# Patient Record
Sex: Female | Born: 1940 | Race: White | Hispanic: No | Smoking: Former smoker
Health system: Southern US, Community
[De-identification: ages and names within clinical notes are randomized; demographics above are authoritative.]

## PROBLEM LIST (undated history)

## (undated) DIAGNOSIS — R17 Unspecified jaundice: Secondary | ICD-10-CM

## (undated) DIAGNOSIS — I499 Cardiac arrhythmia, unspecified: Secondary | ICD-10-CM

## (undated) DIAGNOSIS — D649 Anemia, unspecified: Secondary | ICD-10-CM

## (undated) DIAGNOSIS — K219 Gastro-esophageal reflux disease without esophagitis: Secondary | ICD-10-CM

## (undated) DIAGNOSIS — C801 Malignant (primary) neoplasm, unspecified: Secondary | ICD-10-CM

## (undated) DIAGNOSIS — I10 Essential (primary) hypertension: Secondary | ICD-10-CM

## (undated) DIAGNOSIS — I2510 Atherosclerotic heart disease of native coronary artery without angina pectoris: Secondary | ICD-10-CM

## (undated) DIAGNOSIS — E785 Hyperlipidemia, unspecified: Secondary | ICD-10-CM

## (undated) DIAGNOSIS — M1990 Unspecified osteoarthritis, unspecified site: Secondary | ICD-10-CM

## (undated) DIAGNOSIS — I219 Acute myocardial infarction, unspecified: Secondary | ICD-10-CM

## (undated) DIAGNOSIS — J189 Pneumonia, unspecified organism: Secondary | ICD-10-CM

## (undated) DIAGNOSIS — G709 Myoneural disorder, unspecified: Secondary | ICD-10-CM

## (undated) DIAGNOSIS — R011 Cardiac murmur, unspecified: Secondary | ICD-10-CM

## (undated) DIAGNOSIS — E119 Type 2 diabetes mellitus without complications: Secondary | ICD-10-CM

## (undated) DIAGNOSIS — Z8719 Personal history of other diseases of the digestive system: Secondary | ICD-10-CM

## (undated) DIAGNOSIS — T8859XA Other complications of anesthesia, initial encounter: Secondary | ICD-10-CM

## (undated) DIAGNOSIS — L03116 Cellulitis of left lower limb: Secondary | ICD-10-CM

## (undated) DIAGNOSIS — I739 Peripheral vascular disease, unspecified: Secondary | ICD-10-CM

## (undated) DIAGNOSIS — T4145XA Adverse effect of unspecified anesthetic, initial encounter: Secondary | ICD-10-CM

## (undated) HISTORY — DX: Type 2 diabetes mellitus without complications: E11.9

## (undated) HISTORY — PX: ABDOMINAL HYSTERECTOMY: SHX81

## (undated) HISTORY — PX: TUBAL LIGATION: SHX77

## (undated) HISTORY — PX: TONSILLECTOMY: SUR1361

## (undated) HISTORY — PX: COLONOSCOPY: SHX174

## (undated) HISTORY — PX: CARPAL TUNNEL RELEASE: SHX101

## (undated) HISTORY — PX: APPENDECTOMY: SHX54

## (undated) HISTORY — DX: Hyperlipidemia, unspecified: E78.5

## (undated) HISTORY — PX: CARDIAC CATHETERIZATION: SHX172

## (undated) HISTORY — DX: Essential (primary) hypertension: I10

## (undated) HISTORY — PX: EYE SURGERY: SHX253

## (undated) MED FILL — Magnesium Sulfate IV Soln 4 GM/100ML (40 MG/ML): INTRAVENOUS | Qty: 100 | Status: AC

---
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
## 1998-09-08 ENCOUNTER — Encounter: Admission: RE | Admit: 1998-09-08 | Discharge: 1998-12-07 | Payer: Self-pay | Admitting: Family Medicine

## 1998-10-14 ENCOUNTER — Encounter: Payer: Self-pay | Admitting: Cardiology

## 1998-10-14 ENCOUNTER — Ambulatory Visit (HOSPITAL_COMMUNITY): Admission: RE | Admit: 1998-10-14 | Discharge: 1998-10-14 | Payer: Self-pay | Admitting: Cardiology

## 1999-08-21 ENCOUNTER — Ambulatory Visit (HOSPITAL_COMMUNITY): Admission: RE | Admit: 1999-08-21 | Discharge: 1999-08-21 | Payer: Self-pay | Admitting: Gastroenterology

## 2001-02-19 ENCOUNTER — Encounter: Payer: Self-pay | Admitting: Family Medicine

## 2001-02-19 ENCOUNTER — Ambulatory Visit (HOSPITAL_COMMUNITY): Admission: RE | Admit: 2001-02-19 | Discharge: 2001-02-19 | Payer: Self-pay | Admitting: Family Medicine

## 2001-05-07 ENCOUNTER — Encounter: Payer: Self-pay | Admitting: Obstetrics and Gynecology

## 2001-05-07 ENCOUNTER — Encounter: Admission: RE | Admit: 2001-05-07 | Discharge: 2001-05-07 | Payer: Self-pay | Admitting: Obstetrics and Gynecology

## 2001-08-13 ENCOUNTER — Ambulatory Visit (HOSPITAL_COMMUNITY): Admission: RE | Admit: 2001-08-13 | Discharge: 2001-08-13 | Payer: Self-pay | Admitting: Obstetrics and Gynecology

## 2001-08-13 ENCOUNTER — Encounter (INDEPENDENT_AMBULATORY_CARE_PROVIDER_SITE_OTHER): Payer: Self-pay

## 2002-09-29 ENCOUNTER — Encounter: Payer: Self-pay | Admitting: Family Medicine

## 2002-09-29 ENCOUNTER — Encounter: Admission: RE | Admit: 2002-09-29 | Discharge: 2002-09-29 | Payer: Self-pay | Admitting: Family Medicine

## 2004-01-10 ENCOUNTER — Other Ambulatory Visit: Admission: RE | Admit: 2004-01-10 | Discharge: 2004-01-10 | Payer: Self-pay | Admitting: Family Medicine

## 2006-02-28 ENCOUNTER — Other Ambulatory Visit: Admission: RE | Admit: 2006-02-28 | Discharge: 2006-02-28 | Payer: Self-pay | Admitting: Family Medicine

## 2007-05-13 ENCOUNTER — Other Ambulatory Visit: Admission: RE | Admit: 2007-05-13 | Discharge: 2007-05-13 | Payer: Self-pay | Admitting: Family Medicine

## 2007-08-14 ENCOUNTER — Encounter: Admission: RE | Admit: 2007-08-14 | Discharge: 2007-08-14 | Payer: Self-pay | Admitting: Otolaryngology

## 2010-02-16 ENCOUNTER — Other Ambulatory Visit: Admission: RE | Admit: 2010-02-16 | Discharge: 2010-02-16 | Payer: Self-pay | Admitting: Family Medicine

## 2010-10-15 ENCOUNTER — Encounter: Payer: Self-pay | Admitting: Otolaryngology

## 2011-02-09 NOTE — H&P (Signed)
[facility]  Patient:    [name], [place] Visit Number: [id number] MRN: [id number]          Service Type: Attending:  Olivia Canter. Theda Sers, M.D., Ph.D. Dictated by:   Olivia Canter. Theda Sers, M.D., Ph.D. Adm. Date:  08/13/01   CC:         Frann Rider, M.D., [place] at [place], [facility]   History and Physical  DATE OF BIRTH:                13-Dec-1940  HISTORY OF PRESENT ILLNESS:   The patient is a 70 year old female, who approximately one and a half years ago, began having irregular periods.  Her menstrual periods were regular until that time.  She had been amenorrheic for approximately 10 years from the time she was 64 when she had a tubal ligation. She then restarted her menstrual periods which were regular.  I have explained to her that there is no physiologic to become amenorrheic after a tubal ligation.  She subsequently began having irregular menses and was referred here for further evaluation and management.  The patient subsequently underwent a sonohysterogram which showed a sessile polypoid soft tissue mass, suspicious for an endometrial polyp or endometrial carcinoma, projecting into the lumen of the uterine cavity.  The patient was counseled to undergo a D&C hysteroscopy to rule out malignancy and to remove the polyp.  The risks of surgery including anesthetic complication, hemorrhage, infection, damage to adjacent structures including bladder, bowel, and ureters was discussed with the patient.  She was made aware of the risk of uterine perforation which could result in overwhelming life-threatening hemorrhage requiring emergent hysterectomy or uterine perforation which could result in bowel damage requiring emergent colostomy or which result in overwhelming life-threatening peritonitis.  She expressed understand of and acceptance of these risks.  PAST OBSTETRIC GYNECOLOGIC HISTORY:          1.  Menarche at age 32.                               2. No history of DES exposure.                               3. No history of IUD or STD.  PAST MEDICAL HISTORY:         1. Diabetes.                               2. Hypertension.                               3. Irregular heartbeat for which she sees                                  Dr. Melvern Banker.  She has had an echocardiogram                                  and a Cardiolite stress test.  ALLERGIES:                    DEMEROL.  CURRENT MEDICATIONS:          Toprol, Zestoretic, Glynase, Glucophage, Caltrate, and vitamin E.  SURGERIES:                    Cesarean section x 2, one in 1969 and another in 1972.  FAMILY HISTORY:               There is no family history of ovarian or prostate cancer.  The patients father was diagnosed with colon cancer at the age of 62 and died from this at age 39.  The patients mother is 65 years old with rheumatoid arthritis and hypertension.  The patient has an aunt with breast cancer and one brother age 37 with melanoma.  She has three children, ages 74, 55, and 23 alive and well.  SOCIAL HISTORY:               Native of [place].  Occupation Optometrist. Marital status, married.  Tobacco none, alcohol none.  No recreational drugs.  REVIEW OF SYSTEMS:            Denies headaches, visual changes, chest pain, shortness of breath, abdominal pain, change in bowel habits, unintentional weight loss, dysuria, urgency, frequency, vaginal pruritus or pain or bleeding with intercourse.  PHYSICAL EXAMINATION:  GENERAL:                      Well-developed, pleasant, Caucasian female.  VITAL SIGNS:                  Blood pressure 122/60, weight 176, height 5 feet 4.75 inches.  HEENT:                        Normal.  NECK:                         Supple without thyromegaly.  LUNGS:                        Clear to auscultation.  CARDIAC:                      Regular rate and rhythm.  ABDOMEN:                       Soft, nontender, no hepatosplenomegaly or masses.  There is a well-healed vertical incision scar.  EXTREMITIES:                  No clubbing, cyanosis, or edema.  NEUROLOGIC:                   Oriented x 3.  Grossly normal.  PELVIC:                       Normal external female genitalia.  No vulvar, vaginal, or cervical lesions.  Pap smear was performed by Dr. Inda Merlin and was within normal limits.  Bimanual examination reveals the uterus to be mobile, at the upper limits of normal, approximately eight weeks without any adnexal mass palpated.  ASSESSMENT AND PLAN:          The patient is a 70 year old Caucasian female with postmenopausal bleeding and an endometrial mass, suspicious for endometrial cancer or sessile polyp, admitted for dilation and curettage hysteroscopy.  Risks have been assigned to the patient. She expresses  understanding of and acceptance of these risks.  Please send this straight over the Day Surgery area at [facility] for surgery this afternoon. Dictated by:   Olivia Canter Theda Sers, M.D., Ph.D. Attending:  Olivia Canter. Theda Sers, M.D., Ph.D. DD:  08/13/01 TD:  08/13/01 Job: [id number]

## 2011-02-09 NOTE — Op Note (Signed)
[facility]  Patient:    [name], [name] Visit Number: [id number] MRN: [id number]          Service Type: DSU Location: [facility] Attending Physician:  Lovey Newcomer Dictated by:   Olivia Canter Theda Sers, M.D. Proc. Date: 08/13/01 Admit Date:  08/13/2001   CC:         Frann Rider, M.D.   Operative Report  PREOPERATIVE DIAGNOSIS:       Postmenopausal bleeding, probable sessile polyp.  POSTOPERATIVE DIAGNOSIS:      Postmenopausal bleeding, probable sessile polyp.  OPERATION:                    Cervical dilatation, endometrial curettage using Pipelle, hysteroscopy.  SURGEON:                      Olivia Canter. Theda Sers, M.D.  ANESTHESIA:                   MAC plus 10 cc of 1% lidocaine for paracervical block.  COMPLICATIONS:                None.  DRAINS:                       None.  FLUIDS:                       Approximately 1500 cc of crystalloid.  DESCRIPTION OF PROCEDURE:     The patient was brought to the operating room, identified on the operating table.  After the patient was adequately sedated using monitored anesthesia care, she was prepped and draped in the usual sterile fashion.  She was placed in [place] prior to this.  The bladder was straight catheterized for approximately 50 cc of clear yellow urine. Examination under anesthesia revealed the uterus to be approximately eight weeks, anteverted and not very mobile due to the patients broad vertical cesarean section incision.  The speculum was placed and the cervix was noted to be not very mobile at all, but I was able to infiltrate the anterior portion with 1% lidocaine and grasped it with a single-tooth tenaculum.  The uterus would not pull down using the tenaculum.  I then infiltrated the remainder of 9 cc of 1% lidocaine for paracervical block.  The cervix was then gently dilated.  I was unable to dilate the cervix past the #15 dilator.  In fact, was not able to even initially  get the sound up to the fundus of the uterus.  I was able to hysteroscope the cervical canal but was unable to move to dilate to accommodate the endometrial cavity.  I believe this was due to the immobility of the cervix due to scar tissue due to adhesive disease from the patients previous cesarean section.  I subsequently placed the Pipelle up to the uterine fundus and was able to dilate along the course of the Pipelle as mentioned to a #15 Pratt dilator.  However, I was unable to safely dilate past the #15 Pratt dilator without undue force.  It was my clinical judgment that the risk of perforation was not worth the benefits of obtaining the tissue since I have obtained adequate tissue with two passes of the Pipelle. There was noted to be no bleeding.  At that point the procedure was then terminated.  The patient tolerated the procedure well without apparent complications and was transferred to  the recovery room in stable condition. All instrument, sponge, needle counts were correct.  The patient was given a D&C instruction sheet, urged to return to the office in two weeks for postoperative evaluation and to call for any problems. Dictated by:   Olivia Canter Theda Sers, M.D. Attending Physician:  Lovey Newcomer DD:  08/13/01 TD:  08/13/01 Job: [id number]

## 2011-11-07 DIAGNOSIS — E782 Mixed hyperlipidemia: Secondary | ICD-10-CM | POA: Diagnosis not present

## 2011-11-07 DIAGNOSIS — I10 Essential (primary) hypertension: Secondary | ICD-10-CM | POA: Diagnosis not present

## 2011-11-07 DIAGNOSIS — E119 Type 2 diabetes mellitus without complications: Secondary | ICD-10-CM | POA: Diagnosis not present

## 2011-11-07 DIAGNOSIS — R011 Cardiac murmur, unspecified: Secondary | ICD-10-CM | POA: Diagnosis not present

## 2011-11-14 DIAGNOSIS — Z961 Presence of intraocular lens: Secondary | ICD-10-CM | POA: Diagnosis not present

## 2011-11-19 DIAGNOSIS — I10 Essential (primary) hypertension: Secondary | ICD-10-CM | POA: Diagnosis not present

## 2011-11-19 DIAGNOSIS — E7800 Pure hypercholesterolemia, unspecified: Secondary | ICD-10-CM | POA: Diagnosis not present

## 2011-11-20 DIAGNOSIS — I059 Rheumatic mitral valve disease, unspecified: Secondary | ICD-10-CM | POA: Diagnosis not present

## 2011-11-20 DIAGNOSIS — R011 Cardiac murmur, unspecified: Secondary | ICD-10-CM | POA: Diagnosis not present

## 2011-11-20 DIAGNOSIS — I10 Essential (primary) hypertension: Secondary | ICD-10-CM

## 2011-11-20 HISTORY — DX: Essential (primary) hypertension: I10

## 2011-12-04 DIAGNOSIS — I10 Essential (primary) hypertension: Secondary | ICD-10-CM | POA: Diagnosis not present

## 2012-01-09 ENCOUNTER — Other Ambulatory Visit: Payer: Self-pay | Admitting: Dermatology

## 2012-01-09 DIAGNOSIS — D239 Other benign neoplasm of skin, unspecified: Secondary | ICD-10-CM | POA: Diagnosis not present

## 2012-01-09 DIAGNOSIS — D485 Neoplasm of uncertain behavior of skin: Secondary | ICD-10-CM | POA: Diagnosis not present

## 2012-01-09 DIAGNOSIS — D1801 Hemangioma of skin and subcutaneous tissue: Secondary | ICD-10-CM | POA: Diagnosis not present

## 2012-01-09 DIAGNOSIS — L538 Other specified erythematous conditions: Secondary | ICD-10-CM | POA: Diagnosis not present

## 2012-02-13 ENCOUNTER — Ambulatory Visit
Admission: RE | Admit: 2012-02-13 | Discharge: 2012-02-13 | Disposition: A | Discharge status: Home / Self Care | Payer: Medicare Other | Source: Ambulatory Visit | Attending: Internal Medicine | Admitting: Internal Medicine

## 2012-02-13 ENCOUNTER — Other Ambulatory Visit: Payer: Self-pay | Admitting: Internal Medicine

## 2012-02-13 DIAGNOSIS — R9389 Abnormal findings on diagnostic imaging of other specified body structures: Secondary | ICD-10-CM | POA: Diagnosis not present

## 2012-02-13 MED ORDER — IOHEXOL 300 MG/ML  SOLN
75.0000 mL | Freq: Once | INTRAMUSCULAR | Status: AC | PRN
  Administered 2012-02-13: 75 mL via INTRAVENOUS

## 2012-04-23 DIAGNOSIS — M79609 Pain in unspecified limb: Secondary | ICD-10-CM | POA: Diagnosis not present

## 2012-04-24 ENCOUNTER — Other Ambulatory Visit: Payer: Self-pay | Admitting: Family Medicine

## 2012-04-24 ENCOUNTER — Ambulatory Visit
Admission: RE | Admit: 2012-04-24 | Discharge: 2012-04-24 | Disposition: A | Discharge status: Home / Self Care | Payer: Medicare Other | Source: Ambulatory Visit | Attending: Family Medicine | Admitting: Family Medicine

## 2012-04-24 DIAGNOSIS — M79672 Pain in left foot: Secondary | ICD-10-CM

## 2012-04-24 DIAGNOSIS — I70229 Atherosclerosis of native arteries of extremities with rest pain, unspecified extremity: Secondary | ICD-10-CM | POA: Diagnosis not present

## 2012-04-24 DIAGNOSIS — M7730 Calcaneal spur, unspecified foot: Secondary | ICD-10-CM | POA: Diagnosis not present

## 2012-04-24 DIAGNOSIS — R209 Unspecified disturbances of skin sensation: Secondary | ICD-10-CM | POA: Diagnosis not present

## 2012-04-24 DIAGNOSIS — S92309A Fracture of unspecified metatarsal bone(s), unspecified foot, initial encounter for closed fracture: Secondary | ICD-10-CM | POA: Diagnosis not present

## 2012-05-16 DIAGNOSIS — S92309A Fracture of unspecified metatarsal bone(s), unspecified foot, initial encounter for closed fracture: Secondary | ICD-10-CM | POA: Diagnosis not present

## 2012-06-12 DIAGNOSIS — Z23 Encounter for immunization: Secondary | ICD-10-CM | POA: Diagnosis not present

## 2012-06-12 DIAGNOSIS — Z0000 Encounter for general adult medical examination without abnormal findings: Secondary | ICD-10-CM | POA: Diagnosis not present

## 2012-06-12 DIAGNOSIS — E7800 Pure hypercholesterolemia, unspecified: Secondary | ICD-10-CM | POA: Diagnosis not present

## 2012-06-12 DIAGNOSIS — I10 Essential (primary) hypertension: Secondary | ICD-10-CM | POA: Diagnosis not present

## 2012-06-12 DIAGNOSIS — Z1331 Encounter for screening for depression: Secondary | ICD-10-CM | POA: Diagnosis not present

## 2012-06-12 DIAGNOSIS — K219 Gastro-esophageal reflux disease without esophagitis: Secondary | ICD-10-CM | POA: Diagnosis not present

## 2012-06-12 DIAGNOSIS — E119 Type 2 diabetes mellitus without complications: Secondary | ICD-10-CM | POA: Diagnosis not present

## 2012-06-18 DIAGNOSIS — S92309A Fracture of unspecified metatarsal bone(s), unspecified foot, initial encounter for closed fracture: Secondary | ICD-10-CM | POA: Diagnosis not present

## 2012-07-18 DIAGNOSIS — S92309A Fracture of unspecified metatarsal bone(s), unspecified foot, initial encounter for closed fracture: Secondary | ICD-10-CM | POA: Diagnosis not present

## 2012-08-25 DIAGNOSIS — Z1231 Encounter for screening mammogram for malignant neoplasm of breast: Secondary | ICD-10-CM | POA: Diagnosis not present

## 2012-08-25 DIAGNOSIS — Z803 Family history of malignant neoplasm of breast: Secondary | ICD-10-CM | POA: Diagnosis not present

## 2012-12-12 DIAGNOSIS — H02829 Cysts of unspecified eye, unspecified eyelid: Secondary | ICD-10-CM | POA: Diagnosis not present

## 2012-12-12 DIAGNOSIS — E119 Type 2 diabetes mellitus without complications: Secondary | ICD-10-CM | POA: Diagnosis not present

## 2012-12-12 DIAGNOSIS — Z961 Presence of intraocular lens: Secondary | ICD-10-CM | POA: Diagnosis not present

## 2012-12-22 DIAGNOSIS — I10 Essential (primary) hypertension: Secondary | ICD-10-CM | POA: Diagnosis not present

## 2012-12-22 DIAGNOSIS — E119 Type 2 diabetes mellitus without complications: Secondary | ICD-10-CM | POA: Diagnosis not present

## 2012-12-22 DIAGNOSIS — E7800 Pure hypercholesterolemia, unspecified: Secondary | ICD-10-CM | POA: Diagnosis not present

## 2013-01-07 DIAGNOSIS — D1801 Hemangioma of skin and subcutaneous tissue: Secondary | ICD-10-CM | POA: Diagnosis not present

## 2013-01-07 DIAGNOSIS — D239 Other benign neoplasm of skin, unspecified: Secondary | ICD-10-CM | POA: Diagnosis not present

## 2013-02-23 ENCOUNTER — Other Ambulatory Visit: Payer: Self-pay | Admitting: Internal Medicine

## 2013-02-25 NOTE — Telephone Encounter (Signed)
Please have patient call for an appointment

## 2013-03-24 ENCOUNTER — Other Ambulatory Visit: Payer: Self-pay | Admitting: Internal Medicine

## 2013-03-24 NOTE — Telephone Encounter (Signed)
Rx was sent to pharmacy electronically. 

## 2013-03-30 ENCOUNTER — Encounter: Payer: Self-pay | Admitting: *Deleted

## 2013-03-30 ENCOUNTER — Encounter: Payer: Self-pay | Admitting: Internal Medicine

## 2013-03-31 ENCOUNTER — Ambulatory Visit (INDEPENDENT_AMBULATORY_CARE_PROVIDER_SITE_OTHER): Payer: Medicare Other | Admitting: Internal Medicine

## 2013-03-31 ENCOUNTER — Encounter: Payer: Self-pay | Admitting: Internal Medicine

## 2013-03-31 VITALS — BP 134/64 | HR 70 | Ht 65.5 in | Wt 171.6 lb

## 2013-03-31 DIAGNOSIS — I10 Essential (primary) hypertension: Secondary | ICD-10-CM | POA: Insufficient documentation

## 2013-03-31 DIAGNOSIS — I779 Disorder of arteries and arterioles, unspecified: Secondary | ICD-10-CM | POA: Insufficient documentation

## 2013-03-31 DIAGNOSIS — N1831 Chronic kidney disease, stage 3a: Secondary | ICD-10-CM | POA: Insufficient documentation

## 2013-03-31 DIAGNOSIS — E663 Overweight: Secondary | ICD-10-CM | POA: Insufficient documentation

## 2013-03-31 DIAGNOSIS — I739 Peripheral vascular disease, unspecified: Secondary | ICD-10-CM | POA: Insufficient documentation

## 2013-03-31 DIAGNOSIS — I658 Occlusion and stenosis of other precerebral arteries: Secondary | ICD-10-CM

## 2013-03-31 DIAGNOSIS — I6523 Occlusion and stenosis of bilateral carotid arteries: Secondary | ICD-10-CM

## 2013-03-31 DIAGNOSIS — E785 Hyperlipidemia, unspecified: Secondary | ICD-10-CM | POA: Diagnosis not present

## 2013-03-31 DIAGNOSIS — Z6825 Body mass index (BMI) 25.0-25.9, adult: Secondary | ICD-10-CM

## 2013-03-31 DIAGNOSIS — I6529 Occlusion and stenosis of unspecified carotid artery: Secondary | ICD-10-CM

## 2013-03-31 DIAGNOSIS — E119 Type 2 diabetes mellitus without complications: Secondary | ICD-10-CM | POA: Insufficient documentation

## 2013-03-31 HISTORY — DX: Disorder of arteries and arterioles, unspecified: I77.9

## 2013-03-31 NOTE — Patient Instructions (Addendum)
Your physician has requested that you have a carotid duplex. This test is an ultrasound of the carotid arteries in your neck. It looks at blood flow through these arteries that supply the brain with blood. Allow one hour for this exam. There are no restrictions or special instructions.  Your physician recommends that you have lab work done (at [facility]).  NMR with lipid  Your physician has requested that you have a lower extremity arterial duplex. This test is an ultrasound of the arteries in the legs.. It looks at arterial blood flow in the legs. Allow one hour for Lower Arterial scans. There are no restrictions or special instructions  Your physician recommends that you schedule a follow-up appointment in: 1 year

## 2013-03-31 NOTE — Progress Notes (Signed)
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 Recently, she was having some increasing shortness of breath with exertion and I recommended an echocardiogram. That echocardiogram performed on November 20, 2011, demonstrated a borderline concentric LVH, EF greater than 55% and mild LV relaxation abnormality. Left atrium was normal size, but there was noted to be calcified mass near the left atrial appendage. There was mitral annular calcification and there was no evidence of atrial septal defect. There was no aortic stenosis, but some mild calcification of the aortic valve leaflets. I obtained a CT scan which did not show any significant mass in the atrium. She reports doing fairly well however recently had screening tests including carotid artery Dopplers which indicated mild left internal carotid artery stenosis and moderate right internal carotid artery stenosis. She also had LE dopplers which indicated a mildly reduced ABI of 0.77 on the left.  She denies any chest pain, dypsnea on exertion or other related symptoms.  PMHx:  Past Medical History  Diagnosis Date  . Diabetes mellitus without complication   . Hypertension 11/20/11    ECHO- EF>55% Borderline concentric left ventricular hypertrophy. There is a small calcified mass in the L:A near the LA appendage. No valvular masses seen with associated mitral annular calcification. LA Volume/ BSA27.4 ml/m2 No AS. Right ventricular systolic pressure is elevated at 42mmHg.  Marland Kitchen Hyperlipidemia     History reviewed. No pertinent past surgical history.  FAMHx:  Family History  Problem Relation Age of Onset  . Stroke Mother   . Hypertension Mother   . Hyperlipidemia Mother   . Cancer - Prostate  Father   . Cancer - Colon Father   . Melanoma Brother     SOCHx:   reports that she quit smoking about 41 years ago. She does not have any smokeless tobacco history on file. She reports that she does not drink alcohol or use illicit drugs.  ALLERGIES:  Allergies  Allergen Reactions  . Demerol (Meperidine)     ROS: A comprehensive review of systems was negative.  HOME MEDS: Current Outpatient Prescriptions  Medication Sig Dispense Refill  . aspirin 81 MG tablet Take 81 mg by mouth daily.      . Biotin 2500 MCG CAPS Take by mouth daily.      . Cholecalciferol (VITAMIN D-3) 1000 UNITS CAPS Take 1,000 Units by mouth daily.      Marland Kitchen CINNAMON PO Take 1 g by mouth 2 (two) times daily.      Marland Kitchen diltiazem (CARDIZEM CD) 180 MG 24 hr capsule Take 1 capsule (180 mg total) by mouth daily.  30 capsule  0  . diltiazem (DILACOR XR) 180 MG 24 hr capsule Take 180 mg by mouth daily.      . fish oil-omega-3 fatty acids 1000 MG capsule Take 1 g by mouth daily.      Marland Kitchen lisinopril-hydrochlorothiazide (PRINZIDE,ZESTORETIC) 20-25 MG per tablet Take 1 tablet by mouth daily.      . Magnesium 250 MG TABS Take by mouth daily.      . metFORMIN (GLUCOPHAGE) 1000 MG tablet Take 1,000 mg by mouth 2 (two) times daily with a meal.      . metoprolol (LOPRESSOR) 100 MG tablet Take 100 mg by mouth 2 (two)  times daily.      . Multiple Vitamins-Minerals (ICAPS) CAPS Take by mouth daily.      Marland Kitchen omeprazole (PRILOSEC) 20 MG capsule Take 20 mg by mouth. Takes 4 times a week      . PROBIOTIC CAPS Take by mouth as needed.      . repaglinide (PRANDIN) 1 MG tablet Take 1 mg by mouth 3 (three) times daily before meals.      . simvastatin (ZOCOR) 20 MG tablet Take 20 mg by mouth daily.       No current facility-administered medications for this visit.    LABS/IMAGING: No results found for this or any previous visit (from the past 48 hour(s)). No results found.  VITALS: BP 134/64  Pulse 70  Ht 5' 5.5" (1.664 m)  Wt 171 lb  9.6 oz (77.837 kg)  BMI 28.11 kg/m2  EXAM: General appearance: alert and no distress Neck: no adenopathy, no carotid bruit, no JVD, supple, symmetrical, trachea midline and thyroid not enlarged, symmetric, no tenderness/mass/nodules Lungs: clear to auscultation bilaterally Heart: regular rate and rhythm, S1, S2 normal, no murmur, click, rub or gallop Abdomen: soft, non-tender; bowel sounds normal; no masses,  no organomegaly Extremities: extremities normal, atraumatic, no cyanosis or edema Pulses: 2+ and symmetric Skin: Skin color, texture, turgor normal. No rashes or lesions Neurologic: Grossly normal  EKG: Normal sinus rhythm at 70, voltage criteria for LVH  ASSESSMENT: 1. 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 PAD-asymptomatic 3. Overweight 4. Hypertension 5. Dyslipidemia  PLAN: 1.   Mrs. Attig had recent [employer] screening tests which indicated mild to moderate carotid artery disease and some concern for lower extremity reduced ABI on the right. She denies any claudication. I think this is an opportunity to look more closely at her lipid profile and try to optimize it. I like to obtain a lipid and MR to see if we need to increase or optimize her cholesterol medications to try to slow the progression of her peripheral arterial disease. She is currently taking Zocor 20 mg but takes it 3-4 times a week (every other day).    Pixie Casino, MD, [facility] Attending Cardiologist [facility] [place]  [name],[name] C 03/31/2013, 5:22 PM

## 2013-04-02 DIAGNOSIS — E7800 Pure hypercholesterolemia, unspecified: Secondary | ICD-10-CM | POA: Diagnosis not present

## 2013-04-02 DIAGNOSIS — I10 Essential (primary) hypertension: Secondary | ICD-10-CM | POA: Diagnosis not present

## 2013-04-03 ENCOUNTER — Telehealth: Payer: Self-pay | Admitting: Internal Medicine

## 2013-04-03 DIAGNOSIS — E785 Hyperlipidemia, unspecified: Secondary | ICD-10-CM

## 2013-04-03 NOTE — Telephone Encounter (Signed)
Notified patient that lab has been ordered, and is at [facility] lab. Notified patient of need to be fasting - patient verbalized understanding.

## 2013-04-03 NOTE — Telephone Encounter (Signed)
[name] is needing a lab order for [facility] lab for a lipid panel... Please Call   Thanks

## 2013-04-06 DIAGNOSIS — E785 Hyperlipidemia, unspecified: Secondary | ICD-10-CM | POA: Diagnosis not present

## 2013-04-07 LAB — NMR LIPOPROFILE WITH LIPIDS
Cholesterol, Total: 164 mg/dL (ref <200)
HDL Particle Number: 32.3 umol/L (ref >=30.5)
LP-IR Score: 76 — ABNORMAL HIGH (ref <=45)
Large HDL-P: 3.3 umol/L — ABNORMAL LOW (ref >=4.8)
Large VLDL-P: 6.5 nmol/L — ABNORMAL HIGH (ref <=2.7)
Small LDL Particle Number: 1023 nmol/L — ABNORMAL HIGH (ref <=527)

## 2013-04-08 ENCOUNTER — Ambulatory Visit: Payer: Medicare Other | Admitting: Internal Medicine

## 2013-04-23 ENCOUNTER — Other Ambulatory Visit: Payer: Self-pay | Admitting: Internal Medicine

## 2013-04-23 MED ORDER — ATORVASTATIN CALCIUM 10 MG PO TABS: 10.0000 mg | ORAL_TABLET | Freq: Every day | ORAL | Status: DC

## 2013-04-23 NOTE — Telephone Encounter (Signed)
Ok to refill Diltiazem.  Change to lipitor 10mg    [name] 2:13 PM

## 2013-04-23 NOTE — Telephone Encounter (Signed)
Message from Porfirio Oar, [facility]:  Correct. Either option is fine. If she changes meds - lipitor 10 or crestor 5 would be best options.   ----- Message -----  From: Roselie Awkward, RN  Sent: 04/23/2013 9:10 AM  To: Mamie Nick, RPH   Sally,   Interaction noted with: Simvastatin and Diltiazem. Stated dose of simvastatin shouldn't be >10 mg. Pt is on 20 mg. Should this be changed to another med or decreased? Please advise.   Shandie Bertz M. Nicki Reaper, BSN, RN

## 2013-04-23 NOTE — Telephone Encounter (Signed)
?  Interaction w/ Simvastatin and Diltiazem.  Deferred to S. Putt, PharmD before refilling.

## 2013-04-23 NOTE — Telephone Encounter (Signed)
Dr. Debara Pickett out of office.  Message forwarded to B. Samara Snide, PA-C for further instructions.

## 2013-04-24 ENCOUNTER — Other Ambulatory Visit: Payer: Self-pay | Admitting: *Deleted

## 2013-04-24 NOTE — Telephone Encounter (Signed)
Pt informed of change from Simvastatin to Atorvastatin and reason for changing.  Pt verbalized understanding and agreed w/ plan.

## 2013-05-06 ENCOUNTER — Ambulatory Visit (HOSPITAL_COMMUNITY)
Admission: RE | Admit: 2013-05-06 | Discharge: 2013-05-06 | Disposition: A | Discharge status: Home / Self Care | Payer: Medicare Other | Source: Ambulatory Visit | Attending: Cardiovascular Disease | Admitting: Cardiovascular Disease

## 2013-05-06 DIAGNOSIS — R0989 Other specified symptoms and signs involving the circulatory and respiratory systems: Secondary | ICD-10-CM

## 2013-05-06 DIAGNOSIS — I6529 Occlusion and stenosis of unspecified carotid artery: Secondary | ICD-10-CM | POA: Insufficient documentation

## 2013-05-06 DIAGNOSIS — I6523 Occlusion and stenosis of bilateral carotid arteries: Secondary | ICD-10-CM

## 2013-05-06 NOTE — Progress Notes (Signed)
Carotid Duplex Completed. [name], BS, RDMS, RVT  

## 2013-05-14 ENCOUNTER — Encounter (HOSPITAL_COMMUNITY): Payer: Medicare Other

## 2013-05-21 ENCOUNTER — Ambulatory Visit (HOSPITAL_COMMUNITY)
Admission: RE | Admit: 2013-05-21 | Discharge: 2013-05-21 | Disposition: A | Discharge status: Home / Self Care | Payer: Medicare Other | Source: Ambulatory Visit | Attending: Cardiovascular Disease | Admitting: Cardiovascular Disease

## 2013-05-21 DIAGNOSIS — I70219 Atherosclerosis of native arteries of extremities with intermittent claudication, unspecified extremity: Secondary | ICD-10-CM | POA: Diagnosis not present

## 2013-05-21 DIAGNOSIS — I739 Peripheral vascular disease, unspecified: Secondary | ICD-10-CM | POA: Diagnosis not present

## 2013-05-21 NOTE — Progress Notes (Signed)
Arterial Duplex Lower Ext. Completed. [name], BS, RDMS, RVT  

## 2013-06-22 DIAGNOSIS — I10 Essential (primary) hypertension: Secondary | ICD-10-CM | POA: Diagnosis not present

## 2013-06-22 DIAGNOSIS — K219 Gastro-esophageal reflux disease without esophagitis: Secondary | ICD-10-CM | POA: Diagnosis not present

## 2013-06-22 DIAGNOSIS — Z0000 Encounter for general adult medical examination without abnormal findings: Secondary | ICD-10-CM | POA: Diagnosis not present

## 2013-08-27 DIAGNOSIS — Z1231 Encounter for screening mammogram for malignant neoplasm of breast: Secondary | ICD-10-CM | POA: Diagnosis not present

## 2013-10-28 DIAGNOSIS — IMO0001 Reserved for inherently not codable concepts without codable children: Secondary | ICD-10-CM | POA: Diagnosis not present

## 2013-10-28 DIAGNOSIS — M79609 Pain in unspecified limb: Secondary | ICD-10-CM | POA: Diagnosis not present

## 2013-10-28 DIAGNOSIS — I10 Essential (primary) hypertension: Secondary | ICD-10-CM | POA: Diagnosis not present

## 2013-10-28 DIAGNOSIS — I739 Peripheral vascular disease, unspecified: Secondary | ICD-10-CM | POA: Diagnosis not present

## 2013-11-03 DIAGNOSIS — G5600 Carpal tunnel syndrome, unspecified upper limb: Secondary | ICD-10-CM | POA: Diagnosis not present

## 2013-11-23 DIAGNOSIS — G5600 Carpal tunnel syndrome, unspecified upper limb: Secondary | ICD-10-CM | POA: Diagnosis not present

## 2013-12-01 DIAGNOSIS — G5600 Carpal tunnel syndrome, unspecified upper limb: Secondary | ICD-10-CM | POA: Diagnosis not present

## 2014-01-13 ENCOUNTER — Other Ambulatory Visit: Payer: Self-pay | Admitting: Dermatology

## 2014-01-13 DIAGNOSIS — D1800 Hemangioma unspecified site: Secondary | ICD-10-CM | POA: Diagnosis not present

## 2014-01-13 DIAGNOSIS — D485 Neoplasm of uncertain behavior of skin: Secondary | ICD-10-CM | POA: Diagnosis not present

## 2014-01-13 DIAGNOSIS — D1801 Hemangioma of skin and subcutaneous tissue: Secondary | ICD-10-CM | POA: Diagnosis not present

## 2014-01-13 DIAGNOSIS — D692 Other nonthrombocytopenic purpura: Secondary | ICD-10-CM | POA: Diagnosis not present

## 2014-01-13 DIAGNOSIS — D239 Other benign neoplasm of skin, unspecified: Secondary | ICD-10-CM | POA: Diagnosis not present

## 2014-01-13 DIAGNOSIS — I789 Disease of capillaries, unspecified: Secondary | ICD-10-CM | POA: Diagnosis not present

## 2014-01-27 DIAGNOSIS — E1159 Type 2 diabetes mellitus with other circulatory complications: Secondary | ICD-10-CM | POA: Diagnosis not present

## 2014-01-27 DIAGNOSIS — I10 Essential (primary) hypertension: Secondary | ICD-10-CM | POA: Diagnosis not present

## 2014-01-27 DIAGNOSIS — I739 Peripheral vascular disease, unspecified: Secondary | ICD-10-CM | POA: Diagnosis not present

## 2014-01-27 DIAGNOSIS — E7800 Pure hypercholesterolemia, unspecified: Secondary | ICD-10-CM | POA: Diagnosis not present

## 2014-02-17 DIAGNOSIS — I10 Essential (primary) hypertension: Secondary | ICD-10-CM | POA: Diagnosis not present

## 2014-02-22 DIAGNOSIS — G5600 Carpal tunnel syndrome, unspecified upper limb: Secondary | ICD-10-CM | POA: Diagnosis not present

## 2014-02-22 DIAGNOSIS — G8918 Other acute postprocedural pain: Secondary | ICD-10-CM | POA: Diagnosis not present

## 2014-03-16 DIAGNOSIS — G5600 Carpal tunnel syndrome, unspecified upper limb: Secondary | ICD-10-CM | POA: Diagnosis not present

## 2014-03-23 DIAGNOSIS — M25649 Stiffness of unspecified hand, not elsewhere classified: Secondary | ICD-10-CM | POA: Diagnosis not present

## 2014-04-01 ENCOUNTER — Ambulatory Visit: Payer: Medicare Other | Admitting: Internal Medicine

## 2014-04-06 DIAGNOSIS — M25649 Stiffness of unspecified hand, not elsewhere classified: Secondary | ICD-10-CM | POA: Diagnosis not present

## 2014-04-15 DIAGNOSIS — M25649 Stiffness of unspecified hand, not elsewhere classified: Secondary | ICD-10-CM | POA: Diagnosis not present

## 2014-05-06 DIAGNOSIS — M25649 Stiffness of unspecified hand, not elsewhere classified: Secondary | ICD-10-CM | POA: Diagnosis not present

## 2014-05-12 ENCOUNTER — Encounter: Payer: Self-pay | Admitting: Internal Medicine

## 2014-05-12 ENCOUNTER — Ambulatory Visit (INDEPENDENT_AMBULATORY_CARE_PROVIDER_SITE_OTHER): Payer: Medicare Other | Admitting: Internal Medicine

## 2014-05-12 VITALS — BP 150/78 | HR 69 | Ht 64.75 in | Wt 164.9 lb

## 2014-05-12 DIAGNOSIS — I779 Disorder of arteries and arterioles, unspecified: Secondary | ICD-10-CM | POA: Diagnosis not present

## 2014-05-12 DIAGNOSIS — E785 Hyperlipidemia, unspecified: Secondary | ICD-10-CM

## 2014-05-12 DIAGNOSIS — I739 Peripheral vascular disease, unspecified: Secondary | ICD-10-CM | POA: Diagnosis not present

## 2014-05-12 DIAGNOSIS — I10 Essential (primary) hypertension: Secondary | ICD-10-CM | POA: Diagnosis not present

## 2014-05-12 DIAGNOSIS — E119 Type 2 diabetes mellitus without complications: Secondary | ICD-10-CM

## 2014-05-12 DIAGNOSIS — E663 Overweight: Secondary | ICD-10-CM

## 2014-05-12 NOTE — Patient Instructions (Signed)
Your physician has recommended you make the following change in your medication: INCREASE Lipitor to every day   Your physician wants you to follow-up in: 1 year. You will receive a reminder letter in the mail two months in advance. If you don't receive a letter, please call our office to schedule the follow-up appointment.

## 2014-05-14 ENCOUNTER — Encounter: Payer: Self-pay | Admitting: Internal Medicine

## 2014-05-14 NOTE — Progress Notes (Signed)
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 Recently, she was having some increasing shortness of breath with exertion and I recommended an echocardiogram. That echocardiogram performed on November 20, 2011, demonstrated a borderline concentric LVH, EF greater than 55% and mild LV relaxation abnormality. Left atrium was normal size, but there was noted to be calcified mass near the left atrial appendage. There was mitral annular calcification and there was no evidence of atrial septal defect. There was no aortic stenosis, but some mild calcification of the aortic valve leaflets. I obtained a CT scan which did not show any significant mass in the atrium. She reports doing fairly well however recently had screening tests including carotid artery Dopplers which indicated mild left internal carotid artery stenosis and moderate right internal carotid artery stenosis. She also had LE dopplers which indicated a mildly reduced ABI of 0.77 on the left.  She denies any chest pain, dypsnea on exertion or other related symptoms.  [name] returns today for followup. She is without complaints. She tells me that she is now looking forward to retirement which should be around Christmas. That means no more tax seasons. She denies any chest pain or worsening shortness of breath. She is not visibly been taking her Lipitor every day which would be ideal.  PMHx:  Past Medical History  Diagnosis Date  . Diabetes mellitus without complication   . Hypertension 11/20/11    ECHO- EF>55% Borderline concentric left ventricular hypertrophy. There is a small calcified mass in the L:A near the LA appendage. No valvular masses seen with associated mitral  annular calcification. LA Volume/ BSA27.4 ml/m2 No AS. Right ventricular systolic pressure is elevated at 47mmHg.  Marland Kitchen Hyperlipidemia     History reviewed. No pertinent past surgical history.  FAMHx:  Family History  Problem Relation Age of Onset  . Stroke Mother   . Hypertension Mother   . Hyperlipidemia Mother   . Cancer - Prostate Father   . Cancer - Colon Father   . Melanoma Brother     SOCHx:   reports that she quit smoking about 42 years ago. She does not have any smokeless tobacco history on file. She reports that she does not drink alcohol or use illicit drugs.  ALLERGIES:  Allergies  Allergen Reactions  . Demerol [Meperidine]   . Scopolamine     ROS: A comprehensive review of systems was negative.  HOME MEDS: Current Outpatient Prescriptions  Medication Sig Dispense Refill  . Ascorbic Acid (VITAMIN C) 1000 MG tablet Take 1,000 mg by mouth 4 (four) times a week.       Marland Kitchen aspirin 81 MG tablet Take 81 mg by mouth daily.      Marland Kitchen atorvastatin (LIPITOR) 20 MG tablet Take 20 mg by mouth daily.      . Biotin 2500 MCG CAPS Take 1 capsule by mouth 4 (four) times a week.       . Cholecalciferol (VITAMIN D-3) 1000 UNITS CAPS Take 1,000 Units by mouth daily.      Marland Kitchen CINNAMON PO Take 1 g by mouth 4 (four) times daily.       Marland Kitchen diltiazem (DILACOR XR) 240 MG 24 hr capsule Take 240 mg by mouth daily.      Marland Kitchen  fish oil-omega-3 fatty acids 1000 MG capsule Take 1 g by mouth daily.      Marland Kitchen lisinopril-hydrochlorothiazide (PRINZIDE,ZESTORETIC) 20-25 MG per tablet Take 1 tablet by mouth daily.      . Magnesium 250 MG TABS Take 500 mg by mouth 4 (four) times a week.       . metFORMIN (GLUCOPHAGE) 1000 MG tablet Take 1,000 mg by mouth 2 (two) times daily with a meal.      . metoprolol (LOPRESSOR) 100 MG tablet Take 100 mg by mouth 2 (two) times daily.      . Multiple Vitamins-Minerals (ICAPS) CAPS Take by mouth daily.      Marland Kitchen omeprazole (PRILOSEC) 20 MG capsule Take 20 mg by mouth once a week.        Marland Kitchen PROBIOTIC CAPS Take by mouth as needed.      . repaglinide (PRANDIN) 1 MG tablet Take 2 mg by mouth 3 (three) times daily before meals.        No current facility-administered medications for this visit.    LABS/IMAGING: No results found for this or any previous visit (from the past 48 hour(s)). No results found.  VITALS: BP 150/78  Pulse 69  Ht 5' 4.75" (1.645 m)  Wt 164 lb 14.4 oz (74.798 kg)  BMI 27.64 kg/m2  EXAM: General appearance: alert and no distress Neck: no adenopathy, no carotid bruit, no JVD, supple, symmetrical, trachea midline and thyroid not enlarged, symmetric, no tenderness/mass/nodules Lungs: clear to auscultation bilaterally Heart: regular rate and rhythm, S1, S2 normal, no murmur, click, rub or gallop Abdomen: soft, non-tender; bowel sounds normal; no masses,  no organomegaly Extremities: extremities normal, atraumatic, no cyanosis or edema Pulses: 2+ and symmetric Skin: Skin color, texture, turgor normal. No rashes or lesions Neurologic: Grossly normal  EKG: Normal sinus rhythm at 69  ASSESSMENT: 1. 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 PAD-asymptomatic 3. Overweight 4. Hypertension 5. Dyslipidemia  PLAN: 1.   [name] is doing well. She does have some mild to moderate bilateral carotid artery disease which we can continue to follow this office. She had another lifeline screening test which frankly is a waste of money. She is better off than he her money and taking her cholesterol medicine every day. She is asymptomatic at this time and blood pressure is fairly well-controlled. Will plan to see her back annually or sooner as necessary.  Pixie Casino, MD, [facility] Attending Cardiologist [facility] [place] [facility] 05/14/2014, 5:21 PM

## 2014-05-24 DIAGNOSIS — E1159 Type 2 diabetes mellitus with other circulatory complications: Secondary | ICD-10-CM | POA: Diagnosis not present

## 2014-05-24 DIAGNOSIS — I739 Peripheral vascular disease, unspecified: Secondary | ICD-10-CM | POA: Diagnosis not present

## 2014-05-24 DIAGNOSIS — E7800 Pure hypercholesterolemia, unspecified: Secondary | ICD-10-CM | POA: Diagnosis not present

## 2014-05-24 DIAGNOSIS — I10 Essential (primary) hypertension: Secondary | ICD-10-CM | POA: Diagnosis not present

## 2014-05-26 DIAGNOSIS — E1159 Type 2 diabetes mellitus with other circulatory complications: Secondary | ICD-10-CM | POA: Diagnosis not present

## 2014-06-14 DIAGNOSIS — E11339 Type 2 diabetes mellitus with moderate nonproliferative diabetic retinopathy without macular edema: Secondary | ICD-10-CM | POA: Diagnosis not present

## 2014-06-14 DIAGNOSIS — E1139 Type 2 diabetes mellitus with other diabetic ophthalmic complication: Secondary | ICD-10-CM | POA: Diagnosis not present

## 2014-06-14 DIAGNOSIS — Z961 Presence of intraocular lens: Secondary | ICD-10-CM | POA: Diagnosis not present

## 2014-08-24 ENCOUNTER — Other Ambulatory Visit: Payer: Self-pay | Admitting: Physician Assistant

## 2014-08-30 ENCOUNTER — Other Ambulatory Visit: Payer: Self-pay

## 2014-08-30 MED ORDER — ATORVASTATIN CALCIUM 20 MG PO TABS: 20.0000 mg | ORAL_TABLET | Freq: Every day | ORAL | Status: DC

## 2014-08-31 NOTE — Telephone Encounter (Signed)
LMTCB to clarify dose of lipitor

## 2014-09-01 DIAGNOSIS — Z23 Encounter for immunization: Secondary | ICD-10-CM | POA: Diagnosis not present

## 2014-09-01 DIAGNOSIS — E1165 Type 2 diabetes mellitus with hyperglycemia: Secondary | ICD-10-CM | POA: Diagnosis not present

## 2014-09-01 DIAGNOSIS — I739 Peripheral vascular disease, unspecified: Secondary | ICD-10-CM | POA: Diagnosis not present

## 2014-09-01 DIAGNOSIS — E780 Pure hypercholesterolemia: Secondary | ICD-10-CM | POA: Diagnosis not present

## 2014-09-01 DIAGNOSIS — Z0000 Encounter for general adult medical examination without abnormal findings: Secondary | ICD-10-CM | POA: Diagnosis not present

## 2014-09-01 DIAGNOSIS — I10 Essential (primary) hypertension: Secondary | ICD-10-CM | POA: Diagnosis not present

## 2014-09-01 DIAGNOSIS — E11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: Secondary | ICD-10-CM | POA: Diagnosis not present

## 2014-09-01 DIAGNOSIS — K219 Gastro-esophageal reflux disease without esophagitis: Secondary | ICD-10-CM | POA: Diagnosis not present

## 2014-09-02 ENCOUNTER — Telehealth: Payer: Self-pay | Admitting: Internal Medicine

## 2014-09-02 NOTE — Telephone Encounter (Signed)
Rx was sent to pharmacy electronically on 08/30/14

## 2014-09-02 NOTE — Telephone Encounter (Signed)
Mrs. Nagi  Is calling about the mg of her  Atorvastatin . Please call    Thanks

## 2014-09-02 NOTE — Telephone Encounter (Signed)
Returning your call. °

## 2014-09-02 NOTE — Telephone Encounter (Signed)
Spoke to patient. Clarified the dosage of atorvastatin which should be 20 MG daily (verbally clarified with Dr. Debara Pickett). Pt voiced understanding. Pt did explain that she feels this atorvastatin has been causing her blood sugar to increase. Advised pt to call back in a few weeks if she has concerns about blood sugar and to talk with her PCP as well.

## 2014-09-09 DIAGNOSIS — Z1231 Encounter for screening mammogram for malignant neoplasm of breast: Secondary | ICD-10-CM | POA: Diagnosis not present

## 2014-09-09 DIAGNOSIS — Z803 Family history of malignant neoplasm of breast: Secondary | ICD-10-CM | POA: Diagnosis not present

## 2014-11-26 DIAGNOSIS — M654 Radial styloid tenosynovitis [de Quervain]: Secondary | ICD-10-CM | POA: Diagnosis not present

## 2014-12-01 DIAGNOSIS — I10 Essential (primary) hypertension: Secondary | ICD-10-CM | POA: Diagnosis not present

## 2014-12-01 DIAGNOSIS — E1165 Type 2 diabetes mellitus with hyperglycemia: Secondary | ICD-10-CM | POA: Diagnosis not present

## 2014-12-23 ENCOUNTER — Ambulatory Visit (INDEPENDENT_AMBULATORY_CARE_PROVIDER_SITE_OTHER): Payer: Medicare Other | Admitting: Podiatry

## 2014-12-23 ENCOUNTER — Encounter: Payer: Self-pay | Admitting: Podiatry

## 2014-12-23 VITALS — BP 169/69 | HR 71 | Resp 12 | Ht 64.5 in | Wt 160.0 lb

## 2014-12-23 DIAGNOSIS — L600 Ingrowing nail: Secondary | ICD-10-CM

## 2014-12-23 DIAGNOSIS — L03012 Cellulitis of left finger: Secondary | ICD-10-CM

## 2014-12-23 DIAGNOSIS — L03032 Cellulitis of left toe: Secondary | ICD-10-CM

## 2014-12-23 NOTE — Progress Notes (Signed)
   Subjective:    Patient ID: [name], female    DOB: [birth year], 74 y.o.   MRN: [id number]  HPI Comments: Pt states she has periodically injured both 1st toenails over the years and has had the left removed by Dr. Amalia Hailey.     Review of Systems  All other systems reviewed and are negative.      Objective:   Physical Exam        Assessment & Plan:

## 2014-12-23 NOTE — Progress Notes (Signed)
Subjective:     Patient ID: [name], female   DOB: 1941/07/04, 74 y.o.   MRN: [id number]  HPI patient presents stating this left hallux nail bed is damaged and loose with drainage and I probably bumped it and didn't realize   Review of Systems  All other systems reviewed and are negative.      Objective:   Physical Exam  Constitutional: She is oriented to person, place, and time.  Cardiovascular: Intact distal pulses.   Musculoskeletal: Normal range of motion.  Neurological: She is oriented to person, place, and time.  Skin: Skin is warm.  Nursing note and vitals reviewed.  neurovascular status intact with muscle strength adequate and range of motion subtalar midtarsal joint within normal limits. Patient's noted to have a loose left hallux nailbed that's damaged and painful when pressed with drainage on the underlying surface and good digital perfusion and well oriented 3     Assessment:     Paronychia infection left hallux with drainage noted    Plan:     Reviewed condition and H&P and went ahead infiltrated the left hallux 62 mixture and removed all necrotic tissue abscess tissue and nailbed and flushed in order to reduce the paronychia and abscess type formation. Tolerated well and reappoint to recheck

## 2014-12-23 NOTE — Patient Instructions (Signed)
ANTIBACTERIAL SOAP INSTRUCTIONS  THE DAY AFTER PROCEDURE  Please follow the instructions your doctor has marked.   Shower as usual. Before getting out, place a drop of antibacterial liquid soap (Dial) on a wet, clean washcloth.  Gently wipe washcloth over affected area.  Afterward, rinse the area with warm water.  Blot the area dry with a soft cloth and cover with antibiotic ointment (neosporin, polysporin, bacitracin) and band aid or gauze and tape  Place 3-4 drops of antibacterial liquid soap in a quart of warm tap water.  Submerge foot into water for 20 minutes.  If bandage was applied after your procedure, leave on to allow for easy lift off, then remove and continue with soak for the remaining time.  Next, blot area dry with a soft cloth and cover with a bandage.  Apply other medications as directed by your doctor, such as cortisporin otic solution (eardrops) or neosporin antibiotic ointment 

## 2015-01-14 DIAGNOSIS — M654 Radial styloid tenosynovitis [de Quervain]: Secondary | ICD-10-CM | POA: Diagnosis not present

## 2015-01-14 DIAGNOSIS — G5602 Carpal tunnel syndrome, left upper limb: Secondary | ICD-10-CM | POA: Diagnosis not present

## 2015-01-17 DIAGNOSIS — G5602 Carpal tunnel syndrome, left upper limb: Secondary | ICD-10-CM | POA: Diagnosis not present

## 2015-01-25 DIAGNOSIS — G5602 Carpal tunnel syndrome, left upper limb: Secondary | ICD-10-CM | POA: Diagnosis not present

## 2015-02-23 ENCOUNTER — Other Ambulatory Visit: Payer: Self-pay

## 2015-02-23 MED ORDER — ATORVASTATIN CALCIUM 20 MG PO TABS: 20.0000 mg | ORAL_TABLET | Freq: Every day | ORAL | Status: DC

## 2015-03-11 DIAGNOSIS — C44311 Basal cell carcinoma of skin of nose: Secondary | ICD-10-CM | POA: Diagnosis not present

## 2015-03-11 DIAGNOSIS — D2272 Melanocytic nevi of left lower limb, including hip: Secondary | ICD-10-CM | POA: Diagnosis not present

## 2015-03-11 DIAGNOSIS — D2262 Melanocytic nevi of left upper limb, including shoulder: Secondary | ICD-10-CM | POA: Diagnosis not present

## 2015-03-11 DIAGNOSIS — D225 Melanocytic nevi of trunk: Secondary | ICD-10-CM | POA: Diagnosis not present

## 2015-03-11 DIAGNOSIS — D2261 Melanocytic nevi of right upper limb, including shoulder: Secondary | ICD-10-CM | POA: Diagnosis not present

## 2015-03-11 DIAGNOSIS — D2239 Melanocytic nevi of other parts of face: Secondary | ICD-10-CM | POA: Diagnosis not present

## 2015-03-11 DIAGNOSIS — D1801 Hemangioma of skin and subcutaneous tissue: Secondary | ICD-10-CM | POA: Diagnosis not present

## 2015-03-11 DIAGNOSIS — D2271 Melanocytic nevi of right lower limb, including hip: Secondary | ICD-10-CM | POA: Diagnosis not present

## 2015-03-11 DIAGNOSIS — D485 Neoplasm of uncertain behavior of skin: Secondary | ICD-10-CM | POA: Diagnosis not present

## 2015-03-23 DIAGNOSIS — C44311 Basal cell carcinoma of skin of nose: Secondary | ICD-10-CM | POA: Diagnosis not present

## 2015-03-23 DIAGNOSIS — Z85828 Personal history of other malignant neoplasm of skin: Secondary | ICD-10-CM | POA: Diagnosis not present

## 2015-03-31 DIAGNOSIS — E11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: Secondary | ICD-10-CM | POA: Diagnosis not present

## 2015-03-31 DIAGNOSIS — E781 Pure hyperglyceridemia: Secondary | ICD-10-CM | POA: Diagnosis not present

## 2015-03-31 DIAGNOSIS — E1165 Type 2 diabetes mellitus with hyperglycemia: Secondary | ICD-10-CM | POA: Diagnosis not present

## 2015-03-31 DIAGNOSIS — I739 Peripheral vascular disease, unspecified: Secondary | ICD-10-CM | POA: Diagnosis not present

## 2015-03-31 DIAGNOSIS — I10 Essential (primary) hypertension: Secondary | ICD-10-CM | POA: Diagnosis not present

## 2015-04-04 ENCOUNTER — Encounter: Payer: Self-pay | Admitting: Internal Medicine

## 2015-05-02 ENCOUNTER — Encounter: Payer: Self-pay | Admitting: Internal Medicine

## 2015-05-17 ENCOUNTER — Ambulatory Visit: Payer: Medicare Other | Admitting: Internal Medicine

## 2015-06-01 DIAGNOSIS — G5601 Carpal tunnel syndrome, right upper limb: Secondary | ICD-10-CM | POA: Diagnosis not present

## 2015-06-01 DIAGNOSIS — G5602 Carpal tunnel syndrome, left upper limb: Secondary | ICD-10-CM | POA: Diagnosis not present

## 2015-06-13 DIAGNOSIS — Z4789 Encounter for other orthopedic aftercare: Secondary | ICD-10-CM | POA: Diagnosis not present

## 2015-06-13 DIAGNOSIS — H35372 Puckering of macula, left eye: Secondary | ICD-10-CM | POA: Diagnosis not present

## 2015-06-13 DIAGNOSIS — E11339 Type 2 diabetes mellitus with moderate nonproliferative diabetic retinopathy without macular edema: Secondary | ICD-10-CM | POA: Diagnosis not present

## 2015-06-13 DIAGNOSIS — G5602 Carpal tunnel syndrome, left upper limb: Secondary | ICD-10-CM | POA: Diagnosis not present

## 2015-06-23 ENCOUNTER — Ambulatory Visit (INDEPENDENT_AMBULATORY_CARE_PROVIDER_SITE_OTHER): Payer: Medicare Other | Admitting: Internal Medicine

## 2015-06-23 ENCOUNTER — Encounter: Payer: Self-pay | Admitting: Internal Medicine

## 2015-06-23 VITALS — BP 140/64 | HR 66 | Ht 64.5 in | Wt 162.2 lb

## 2015-06-23 DIAGNOSIS — E663 Overweight: Secondary | ICD-10-CM

## 2015-06-23 DIAGNOSIS — E1142 Type 2 diabetes mellitus with diabetic polyneuropathy: Secondary | ICD-10-CM

## 2015-06-23 DIAGNOSIS — E785 Hyperlipidemia, unspecified: Secondary | ICD-10-CM

## 2015-06-23 DIAGNOSIS — I779 Disorder of arteries and arterioles, unspecified: Secondary | ICD-10-CM

## 2015-06-23 DIAGNOSIS — I10 Essential (primary) hypertension: Secondary | ICD-10-CM | POA: Diagnosis not present

## 2015-06-23 DIAGNOSIS — I739 Peripheral vascular disease, unspecified: Secondary | ICD-10-CM

## 2015-06-23 NOTE — Progress Notes (Signed)
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 Recently, she was having some increasing shortness of breath with exertion and I recommended an echocardiogram. That echocardiogram performed on November 20, 2011, demonstrated a borderline concentric LVH, EF greater than 55% and mild LV relaxation abnormality. Left atrium was normal size, but there was noted to be calcified mass near the left atrial appendage. There was mitral annular calcification and there was no evidence of atrial septal defect. There was no aortic stenosis, but some mild calcification of the aortic valve leaflets. I obtained a CT scan which did not show any significant mass in the atrium. She reports doing fairly well however recently had screening tests including carotid artery Dopplers which indicated mild left internal carotid artery stenosis and moderate right internal carotid artery stenosis. She also had LE dopplers which indicated a mildly reduced ABI of 0.77 on the left.  She denies any chest pain, dypsnea on exertion or other related symptoms.  [name] returns today for followup. She is without complaints. She tells me that she is now looking forward to retirement which should be around Christmas. That means no more tax seasons. She denies any chest pain or worsening shortness of breath. She is not visibly been taking her Lipitor every day which would be ideal.  [name] in the office today for follow-up. She tells me that she's actually retiring tomorrow which I congratulated her on. She says she is going to be taking up more competitive pool playing in her spare time. Overall she's feeling well denies any chest pain or worsening shortness of breath. It's  been about 2 years since we've ultrasound her carotid and lower extremities. She denies any claudication although does notice some dependent rubor to her feet. 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 Recent laboratory work shows good cholesterol control with a total cholesterol around 1:15 LDL around 80. She seems to be tolerating atorvastatin. She is on low-dose aspirin.  PMHx:  Past Medical History  Diagnosis Date  . Diabetes mellitus without complication   . Hypertension 11/20/11    ECHO- EF>55% Borderline concentric left ventricular hypertrophy. There is a small calcified mass in the L:A near the LA appendage. No valvular masses seen with associated mitral annular calcification. LA Volume/ BSA27.4 ml/m2 No AS. Right ventricular systolic pressure is elevated at 70mmHg.  Marland Kitchen Hyperlipidemia     Past Surgical History  Procedure Laterality Date  . Abdominal hysterectomy      FAMHx:  Family History  Problem Relation Age of Onset  . Stroke Mother   . Hypertension Mother   . Hyperlipidemia Mother   . Cancer - Prostate Father   . Cancer - Colon Father   . Melanoma Brother     SOCHx:   reports that she quit smoking about 43 years ago. She does not have any smokeless tobacco history on file. She reports that she does not drink alcohol or use illicit drugs.  ALLERGIES:  Allergies  Allergen Reactions  . Demerol [Meperidine]   . Scopolamine     ROS: A comprehensive review of systems was negative.  HOME MEDS: Current Outpatient Prescriptions  Medication Sig Dispense Refill  .  aspirin 81 MG tablet Take 81 mg by mouth daily.    Marland Kitchen atorvastatin (LIPITOR) 20 MG tablet Take 1 tablet (20 mg total) by mouth daily. 90 tablet 0  . Cholecalciferol (VITAMIN D-3) 1000 UNITS CAPS Take 1,000 Units by mouth daily.    Marland Kitchen CINNAMON PO Take 1,000 mg by mouth 4 (four) times daily.     . Cyanocobalamin (VITAMIN B-12 PO) Take 1,000  mcg by mouth daily.     Marland Kitchen diltiazem (CARTIA XT) 240 MG 24 hr capsule Take 240 mg by mouth daily.    . fish oil-omega-3 fatty acids 1000 MG capsule Take 1 g by mouth daily.    . hydrochlorothiazide (HYDRODIURIL) 25 MG tablet Take 25 mg by mouth daily.    . Magnesium 500 MG TABS Take 1 tablet by mouth 4 (four) times a week.    . metFORMIN (GLUCOPHAGE) 1000 MG tablet Take 1,000 mg by mouth 2 (two) times daily with a meal.    . metoprolol (LOPRESSOR) 100 MG tablet Take 100 mg by mouth 2 (two) times daily.    Marland Kitchen omeprazole (PRILOSEC) 20 MG capsule Take 20 mg by mouth once a week.     Marland Kitchen PROBIOTIC CAPS Take by mouth as needed.    . pyridOXINE (VITAMIN B-6) 100 MG tablet Take 100 mg by mouth daily.    . repaglinide (PRANDIN) 2 MG tablet Take 2 mg by mouth 3 (three) times daily before meals.    . valsartan (DIOVAN) 320 MG tablet Take 320 mg by mouth daily.     No current facility-administered medications for this visit.    LABS/IMAGING: No results found for this or any previous visit (from the past 48 hour(s)). No results found.  VITALS: BP 140/64 mmHg  Pulse 66  Ht 5' 4.5" (1.638 m)  Wt 162 lb 3.2 oz (73.573 kg)  BMI 27.42 kg/m2  EXAM: General appearance: alert and no distress Neck: no adenopathy, no carotid bruit, no JVD, supple, symmetrical, trachea midline and thyroid not enlarged, symmetric, no tenderness/mass/nodules Lungs: clear to auscultation bilaterally Heart: regular rate and rhythm, S1, S2 normal, no murmur, click, rub or gallop Abdomen: soft, non-tender; bowel sounds normal; no masses,  no organomegaly Extremities: Mild dependent rubor, numerous spider veins Pulses: 1+ right DP, faint left DP, reduced PT pulses with delayed capillary refill to 3 seconds in the toes Skin: Skin color, texture, turgor normal. 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 Bilateral mild carotid  artery disease 2. PAD-asymptomatic, with reduced ABI on the left and reduced TBI's bilaterally 3. Overweight 4. Hypertension 5. Dyslipidemia  PLAN: 1.   Mrs. Dub is doing well. She does have some mild bilateral carotid artery disease which is overdue for follow-up ultrasound.  Blood pressure appears to be well-controlled. I reviewed his sheet that she brought from home with blood pressure readings. Her cholesterol is at goal. She needs to work on more exercise and weight loss and says she plans to join [employer] since she's retiring tomorrow. With regards to her PAD, we talked about good foot care. She's not having any claudications therefore I will not pursue repeat lower extremity arterial Dopplers. Most of her disease appears to be small vessel which is likely consistent with her diabetes. Plan to see her back annually or sooner as necessary.   Pixie Casino, MD, [facility] Attending Cardiologist [place]  06/23/2015, 9:04 AM

## 2015-06-23 NOTE — Patient Instructions (Signed)
Your physician has requested that you have a carotid duplex. This test is an ultrasound of the carotid arteries in your neck. It looks at blood flow through these arteries that supply the brain with blood. Allow one hour for this exam. There are no restrictions or special instructions.  Your physician wants you to follow-up in: 1 year with Dr. Hilty. You will receive a reminder letter in the mail two months in advance. If you don't receive a letter, please call our office to schedule the follow-up appointment.  

## 2015-06-29 DIAGNOSIS — H02824 Cysts of left upper eyelid: Secondary | ICD-10-CM | POA: Diagnosis not present

## 2015-06-29 DIAGNOSIS — H00025 Hordeolum internum left lower eyelid: Secondary | ICD-10-CM | POA: Diagnosis not present

## 2015-06-30 ENCOUNTER — Ambulatory Visit (HOSPITAL_COMMUNITY)
Admission: RE | Admit: 2015-06-30 | Discharge: 2015-06-30 | Disposition: A | Discharge status: Home / Self Care | Payer: Medicare Other | Source: Ambulatory Visit | Attending: Internal Medicine | Admitting: Internal Medicine

## 2015-06-30 DIAGNOSIS — E119 Type 2 diabetes mellitus without complications: Secondary | ICD-10-CM | POA: Diagnosis not present

## 2015-06-30 DIAGNOSIS — I6523 Occlusion and stenosis of bilateral carotid arteries: Secondary | ICD-10-CM | POA: Insufficient documentation

## 2015-06-30 DIAGNOSIS — Z87891 Personal history of nicotine dependence: Secondary | ICD-10-CM | POA: Insufficient documentation

## 2015-06-30 DIAGNOSIS — I739 Peripheral vascular disease, unspecified: Secondary | ICD-10-CM

## 2015-06-30 DIAGNOSIS — I10 Essential (primary) hypertension: Secondary | ICD-10-CM | POA: Diagnosis not present

## 2015-06-30 DIAGNOSIS — E785 Hyperlipidemia, unspecified: Secondary | ICD-10-CM | POA: Insufficient documentation

## 2015-06-30 DIAGNOSIS — I779 Disorder of arteries and arterioles, unspecified: Secondary | ICD-10-CM | POA: Diagnosis not present

## 2015-07-05 ENCOUNTER — Other Ambulatory Visit: Payer: Self-pay | Admitting: *Deleted

## 2015-07-05 DIAGNOSIS — I739 Peripheral vascular disease, unspecified: Principal | ICD-10-CM

## 2015-07-05 DIAGNOSIS — I779 Disorder of arteries and arterioles, unspecified: Secondary | ICD-10-CM

## 2015-07-11 DIAGNOSIS — G5602 Carpal tunnel syndrome, left upper limb: Secondary | ICD-10-CM | POA: Diagnosis not present

## 2015-07-11 DIAGNOSIS — Z4789 Encounter for other orthopedic aftercare: Secondary | ICD-10-CM | POA: Diagnosis not present

## 2015-08-04 DIAGNOSIS — D2212 Melanocytic nevi of left eyelid, including canthus: Secondary | ICD-10-CM | POA: Diagnosis not present

## 2015-09-13 DIAGNOSIS — Z803 Family history of malignant neoplasm of breast: Secondary | ICD-10-CM | POA: Diagnosis not present

## 2015-09-13 DIAGNOSIS — Z1231 Encounter for screening mammogram for malignant neoplasm of breast: Secondary | ICD-10-CM | POA: Diagnosis not present

## 2015-09-15 DIAGNOSIS — I739 Peripheral vascular disease, unspecified: Secondary | ICD-10-CM | POA: Diagnosis not present

## 2015-09-15 DIAGNOSIS — E11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: Secondary | ICD-10-CM | POA: Diagnosis not present

## 2015-09-15 DIAGNOSIS — Z23 Encounter for immunization: Secondary | ICD-10-CM | POA: Diagnosis not present

## 2015-09-15 DIAGNOSIS — I6529 Occlusion and stenosis of unspecified carotid artery: Secondary | ICD-10-CM | POA: Diagnosis not present

## 2015-09-15 DIAGNOSIS — I10 Essential (primary) hypertension: Secondary | ICD-10-CM | POA: Diagnosis not present

## 2015-09-15 DIAGNOSIS — E7800 Pure hypercholesterolemia, unspecified: Secondary | ICD-10-CM | POA: Diagnosis not present

## 2015-09-15 DIAGNOSIS — E1165 Type 2 diabetes mellitus with hyperglycemia: Secondary | ICD-10-CM | POA: Diagnosis not present

## 2015-09-15 DIAGNOSIS — Z7984 Long term (current) use of oral hypoglycemic drugs: Secondary | ICD-10-CM | POA: Diagnosis not present

## 2015-09-15 DIAGNOSIS — E11319 Type 2 diabetes mellitus with unspecified diabetic retinopathy without macular edema: Secondary | ICD-10-CM | POA: Diagnosis not present

## 2015-09-15 DIAGNOSIS — Z0000 Encounter for general adult medical examination without abnormal findings: Secondary | ICD-10-CM | POA: Diagnosis not present

## 2015-09-29 DIAGNOSIS — Z803 Family history of malignant neoplasm of breast: Secondary | ICD-10-CM | POA: Diagnosis not present

## 2015-09-29 DIAGNOSIS — R922 Inconclusive mammogram: Secondary | ICD-10-CM | POA: Diagnosis not present

## 2015-09-29 DIAGNOSIS — R928 Other abnormal and inconclusive findings on diagnostic imaging of breast: Secondary | ICD-10-CM | POA: Diagnosis not present

## 2015-09-29 DIAGNOSIS — Z1231 Encounter for screening mammogram for malignant neoplasm of breast: Secondary | ICD-10-CM | POA: Diagnosis not present

## 2015-10-25 ENCOUNTER — Other Ambulatory Visit: Payer: Self-pay | Admitting: Internal Medicine

## 2015-10-27 DIAGNOSIS — M859 Disorder of bone density and structure, unspecified: Secondary | ICD-10-CM | POA: Diagnosis not present

## 2015-10-27 DIAGNOSIS — Z780 Asymptomatic menopausal state: Secondary | ICD-10-CM | POA: Diagnosis not present

## 2015-12-21 ENCOUNTER — Other Ambulatory Visit: Payer: Self-pay | Admitting: Internal Medicine

## 2015-12-21 NOTE — Telephone Encounter (Signed)
Rx(s) sent to pharmacy electronically.  

## 2016-01-12 DIAGNOSIS — E1165 Type 2 diabetes mellitus with hyperglycemia: Secondary | ICD-10-CM | POA: Diagnosis not present

## 2016-01-12 DIAGNOSIS — E7800 Pure hypercholesterolemia, unspecified: Secondary | ICD-10-CM | POA: Diagnosis not present

## 2016-01-12 DIAGNOSIS — E11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: Secondary | ICD-10-CM | POA: Diagnosis not present

## 2016-01-12 DIAGNOSIS — I6529 Occlusion and stenosis of unspecified carotid artery: Secondary | ICD-10-CM | POA: Diagnosis not present

## 2016-01-12 DIAGNOSIS — I10 Essential (primary) hypertension: Secondary | ICD-10-CM | POA: Diagnosis not present

## 2016-04-06 DIAGNOSIS — D2261 Melanocytic nevi of right upper limb, including shoulder: Secondary | ICD-10-CM | POA: Diagnosis not present

## 2016-04-06 DIAGNOSIS — D2272 Melanocytic nevi of left lower limb, including hip: Secondary | ICD-10-CM | POA: Diagnosis not present

## 2016-04-06 DIAGNOSIS — D225 Melanocytic nevi of trunk: Secondary | ICD-10-CM | POA: Diagnosis not present

## 2016-04-06 DIAGNOSIS — L814 Other melanin hyperpigmentation: Secondary | ICD-10-CM | POA: Diagnosis not present

## 2016-04-06 DIAGNOSIS — Z85828 Personal history of other malignant neoplasm of skin: Secondary | ICD-10-CM | POA: Diagnosis not present

## 2016-04-06 DIAGNOSIS — D2239 Melanocytic nevi of other parts of face: Secondary | ICD-10-CM | POA: Diagnosis not present

## 2016-04-06 DIAGNOSIS — D1801 Hemangioma of skin and subcutaneous tissue: Secondary | ICD-10-CM | POA: Diagnosis not present

## 2016-04-06 DIAGNOSIS — D2271 Melanocytic nevi of right lower limb, including hip: Secondary | ICD-10-CM | POA: Diagnosis not present

## 2016-04-06 DIAGNOSIS — L905 Scar conditions and fibrosis of skin: Secondary | ICD-10-CM | POA: Diagnosis not present

## 2016-04-06 DIAGNOSIS — D2262 Melanocytic nevi of left upper limb, including shoulder: Secondary | ICD-10-CM | POA: Diagnosis not present

## 2016-05-07 DIAGNOSIS — M542 Cervicalgia: Secondary | ICD-10-CM | POA: Diagnosis not present

## 2016-05-11 DIAGNOSIS — Z7984 Long term (current) use of oral hypoglycemic drugs: Secondary | ICD-10-CM | POA: Diagnosis not present

## 2016-05-11 DIAGNOSIS — I10 Essential (primary) hypertension: Secondary | ICD-10-CM | POA: Diagnosis not present

## 2016-05-11 DIAGNOSIS — E1165 Type 2 diabetes mellitus with hyperglycemia: Secondary | ICD-10-CM | POA: Diagnosis not present

## 2016-05-11 DIAGNOSIS — E7800 Pure hypercholesterolemia, unspecified: Secondary | ICD-10-CM | POA: Diagnosis not present

## 2016-06-08 DIAGNOSIS — M542 Cervicalgia: Secondary | ICD-10-CM | POA: Diagnosis not present

## 2016-06-11 DIAGNOSIS — E113393 Type 2 diabetes mellitus with moderate nonproliferative diabetic retinopathy without macular edema, bilateral: Secondary | ICD-10-CM | POA: Diagnosis not present

## 2016-06-11 DIAGNOSIS — H35372 Puckering of macula, left eye: Secondary | ICD-10-CM | POA: Diagnosis not present

## 2016-06-11 DIAGNOSIS — Z961 Presence of intraocular lens: Secondary | ICD-10-CM | POA: Diagnosis not present

## 2016-06-15 ENCOUNTER — Ambulatory Visit: Payer: Medicare Other | Admitting: Internal Medicine

## 2016-06-15 DIAGNOSIS — M542 Cervicalgia: Secondary | ICD-10-CM | POA: Diagnosis not present

## 2016-06-26 DIAGNOSIS — M542 Cervicalgia: Secondary | ICD-10-CM | POA: Diagnosis not present

## 2016-07-06 DIAGNOSIS — M542 Cervicalgia: Secondary | ICD-10-CM | POA: Diagnosis not present

## 2016-07-09 ENCOUNTER — Encounter: Payer: Self-pay | Admitting: Internal Medicine

## 2016-07-09 ENCOUNTER — Ambulatory Visit (INDEPENDENT_AMBULATORY_CARE_PROVIDER_SITE_OTHER): Payer: Medicare Other | Admitting: Internal Medicine

## 2016-07-09 ENCOUNTER — Ambulatory Visit (HOSPITAL_COMMUNITY)
Admission: RE | Admit: 2016-07-09 | Discharge: 2016-07-09 | Disposition: A | Discharge status: Home / Self Care | Payer: Medicare Other | Source: Ambulatory Visit | Attending: Internal Medicine | Admitting: Internal Medicine

## 2016-07-09 VITALS — BP 157/60 | HR 61 | Ht 64.0 in | Wt 163.2 lb

## 2016-07-09 DIAGNOSIS — I779 Disorder of arteries and arterioles, unspecified: Secondary | ICD-10-CM | POA: Diagnosis present

## 2016-07-09 DIAGNOSIS — Z23 Encounter for immunization: Secondary | ICD-10-CM | POA: Diagnosis not present

## 2016-07-09 DIAGNOSIS — E11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: Secondary | ICD-10-CM | POA: Insufficient documentation

## 2016-07-09 DIAGNOSIS — I6523 Occlusion and stenosis of bilateral carotid arteries: Secondary | ICD-10-CM | POA: Diagnosis not present

## 2016-07-09 DIAGNOSIS — I739 Peripheral vascular disease, unspecified: Secondary | ICD-10-CM | POA: Diagnosis not present

## 2016-07-09 DIAGNOSIS — E785 Hyperlipidemia, unspecified: Secondary | ICD-10-CM | POA: Insufficient documentation

## 2016-07-09 DIAGNOSIS — Z87891 Personal history of nicotine dependence: Secondary | ICD-10-CM | POA: Diagnosis not present

## 2016-07-09 DIAGNOSIS — I10 Essential (primary) hypertension: Secondary | ICD-10-CM | POA: Insufficient documentation

## 2016-07-09 MED ORDER — SITAGLIPTIN PHOSPHATE 100 MG PO TABS: 100.0000 mg | ORAL_TABLET | Freq: Every day | ORAL | 0 refills | Status: DC

## 2016-07-09 MED ORDER — METOPROLOL TARTRATE 100 MG PO TABS: ORAL_TABLET | ORAL | 3 refills | Status: DC

## 2016-07-09 NOTE — Progress Notes (Signed)
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 Recently, she was having some increasing shortness of breath with exertion and I recommended an echocardiogram. That echocardiogram performed on November 20, 2011, demonstrated a borderline concentric LVH, EF greater than 55% and mild LV relaxation abnormality. Left atrium was normal size, but there was noted to be calcified mass near the left atrial appendage. There was mitral annular calcification and there was no evidence of atrial septal defect. There was no aortic stenosis, but some mild calcification of the aortic valve leaflets. I obtained a CT scan which did not show any significant mass in the atrium. She reports doing fairly well however recently had screening tests including carotid artery Dopplers which indicated mild left internal carotid artery stenosis and moderate right internal carotid artery stenosis. She also had LE dopplers which indicated a mildly reduced ABI of 0.77 on the left.  She denies any chest pain, dypsnea on exertion or other related symptoms.  [name] returns today for followup. She is without complaints. She tells me that she is now looking forward to retirement which should be around Christmas. That means no more tax seasons. She denies any chest pain or worsening shortness of breath. She is not visibly been taking her Lipitor every day which would be ideal.  A Kobie back in the office today for follow-up. She tells me that she's actually retiring tomorrow which I congratulated her on. She says she is going to be taking up more competitive pool playing in her spare time. Overall she's feeling well denies any chest pain or worsening shortness of breath. It's  been about 2 years since we've ultrasound her carotid and lower extremities. She denies any claudication although does notice some dependent rubor to her feet. 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 Recent laboratory work shows good cholesterol control with a total cholesterol around 1:15 LDL around 80. She seems to be tolerating atorvastatin. She is on low-dose aspirin.  07/10/2016  Mrs. Arbogast returns today for follow-up. She is doing very well. Over the past year since she's been retired she's taken up the game of pool and plays in 2 weeks about 3-4 nights a week. She brought a list of her blood pressures with her which shows pretty good control in general she was between the 120s and 140s. In mid August she had noted some lower blood pressures and her primary care provider cut back her morning dose to 50 mg of metoprolol and 100 mg at night. Blood pressures accordingly are slightly higher but still fairly well-controlled. She was asymptomatic with her heart rates in the 50s. She denies any chest pain or worsening shortness of breath. EKG shows sinus rhythm with a borderline first-degree AV block.  PMHx:  Past Medical History:  Diagnosis Date  . Diabetes mellitus without complication ([place])   . Hyperlipidemia   . Hypertension 11/20/11   ECHO- EF>55% Borderline concentric left ventricular hypertrophy. There is a small calcified mass in the L:A near the LA appendage. No valvular masses seen with associated mitral annular calcification. LA Volume/ BSA27.4 ml/m2 No AS. Right ventricular systolic pressure is elevated at 86mmHg.    Past Surgical History:  Procedure Laterality Date  . ABDOMINAL HYSTERECTOMY      FAMHx:  Family History  Problem Relation Age of Onset  . Stroke Mother   . Hypertension Mother   . Hyperlipidemia Mother   . Cancer - Prostate Father   . Cancer - Colon Father   . Melanoma Brother     SOCHx:     reports that she quit smoking about 44 years ago. She does not have any smokeless tobacco history on file. She reports that she does not drink alcohol or use drugs.  ALLERGIES:  Allergies  Allergen Reactions  . Demerol [Meperidine]   . Scopolamine     ROS: A comprehensive review of systems was negative.  HOME MEDS: Current Outpatient Prescriptions  Medication Sig Dispense Refill  . aspirin 81 MG tablet Take 81 mg by mouth daily.    Marland Kitchen atorvastatin (LIPITOR) 20 MG tablet TAKE ONE TABLET BY MOUTH ONCE DAILY 90 tablet 0  . Cholecalciferol (VITAMIN D-3) 1000 UNITS CAPS Take 1,000 Units by mouth daily.    Marland Kitchen CINNAMON PO Take 1,000 mg by mouth 4 (four) times daily.     . Cyanocobalamin (VITAMIN B-12 PO) Take 1,000 mcg by mouth daily.     Marland Kitchen diltiazem (CARTIA XT) 240 MG 24 hr capsule Take 240 mg by mouth daily.    . fish oil-omega-3 fatty acids 1000 MG capsule Take 1 g by mouth daily.    . hydrochlorothiazide (HYDRODIURIL) 25 MG tablet Take 25 mg by mouth daily.    . Magnesium 500 MG TABS Take 1 tablet by mouth 4 (four) times a week.    . metFORMIN (GLUCOPHAGE) 1000 MG tablet Take 1,000 mg by mouth 2 (two) times daily with a meal.    . metoprolol (LOPRESSOR) 100 MG tablet Take half tab in the morning and a whole tab in the evening. 45 tablet 3  . omeprazole (PRILOSEC) 20 MG capsule Take 20 mg by mouth once a week.     Marland Kitchen PROBIOTIC CAPS Take by mouth as needed.    . pyridOXINE (VITAMIN B-6) 100 MG tablet Take 100 mg by mouth daily.    . valsartan (DIOVAN) 320 MG tablet Take 320 mg by mouth daily.    . sitaGLIPtin (JANUVIA) 100 MG tablet Take 1 tablet (100 mg total) by mouth daily. 30 tablet 0   No current facility-administered medications for this visit.     LABS/IMAGING: No results found for this or any previous visit (from the past 48 hour(s)). No results found.  VITALS: BP (!) 157/60   Pulse 61   Ht 5\' 4"  (1.626 m)   Wt 163 lb 3.2 oz (74 kg)   BMI 28.01 kg/m   EXAM: General  appearance: alert and no distress Neck: no adenopathy, no carotid bruit, no JVD, supple, symmetrical, trachea midline and thyroid not enlarged, symmetric, no tenderness/mass/nodules Lungs: clear to auscultation bilaterally Heart: regular rate and rhythm, S1, S2 normal, no murmur, click, rub or gallop Abdomen: soft, non-tender; bowel sounds normal; no masses,  no organomegaly Extremities: Mild dependent rubor, numerous spider veins Pulses: 1+ right DP, faint left DP, reduced PT pulses with delayed capillary refill to 3 seconds in the toes Skin: Skin color, texture, turgor normal. No rashes or lesions Neurologic: Grossly normal  EKG: Normal sinus rhythm at 61, LVH with IVCD  ASSESSMENT: 1. Bilateral mild carotid artery disease 2. PAD-asymptomatic, with reduced ABI on the left and reduced TBI's bilaterally 3. Overweight 4. Hypertension 5. Dyslipidemia  PLAN: 1.  Mrs. Benedick is doing well. She will have repeat carotid Dopplers today. She has no symptoms of PAD particularly no claudication. She needs to continue to work on weight loss and now that she is retired of encouraged that more. Blood pressure is fairly well-controlled at home. She has had some bradycardia but was asymptomatic with this. Nevertheless her Toprol dose was decreased slightly. Cholesterol is at goal. Follow-up with me annually or sooner as necessary. We provided an influenza vaccine at her request today as well.   Pixie Casino, MD, [facility] Attending Cardiologist [place] 07/09/2016, 9:23 AM

## 2016-07-09 NOTE — Patient Instructions (Addendum)
Medication Instructions:  Your physician recommends that you continue on your current medications as directed. Please refer to the Current Medication list given to you today.  Labwork: None  Testing/Procedures: None   Follow-Up: Your physician wants you to follow-up in: 12 Months with Dr Debara Pickett. You will receive a reminder letter in the mail two months in advance. If you don't receive a letter, please call our office to schedule the follow-up appointment.   Any Other Special Instructions Will Be Listed Below (If Applicable).     If you need a refill on your cardiac medications before your next appointment, please call your pharmacy.

## 2016-07-13 DIAGNOSIS — M542 Cervicalgia: Secondary | ICD-10-CM | POA: Diagnosis not present

## 2016-07-20 DIAGNOSIS — M542 Cervicalgia: Secondary | ICD-10-CM | POA: Diagnosis not present

## 2016-07-27 DIAGNOSIS — M542 Cervicalgia: Secondary | ICD-10-CM | POA: Diagnosis not present

## 2016-08-03 DIAGNOSIS — M542 Cervicalgia: Secondary | ICD-10-CM | POA: Diagnosis not present

## 2016-08-20 DIAGNOSIS — M542 Cervicalgia: Secondary | ICD-10-CM | POA: Diagnosis not present

## 2016-09-19 ENCOUNTER — Other Ambulatory Visit: Payer: Self-pay | Admitting: Internal Medicine

## 2016-11-08 DIAGNOSIS — Z7984 Long term (current) use of oral hypoglycemic drugs: Secondary | ICD-10-CM | POA: Diagnosis not present

## 2016-11-08 DIAGNOSIS — E7800 Pure hypercholesterolemia, unspecified: Secondary | ICD-10-CM | POA: Diagnosis not present

## 2016-11-08 DIAGNOSIS — E113293 Type 2 diabetes mellitus with mild nonproliferative diabetic retinopathy without macular edema, bilateral: Secondary | ICD-10-CM | POA: Diagnosis not present

## 2016-11-08 DIAGNOSIS — I10 Essential (primary) hypertension: Secondary | ICD-10-CM | POA: Diagnosis not present

## 2016-11-08 DIAGNOSIS — E1165 Type 2 diabetes mellitus with hyperglycemia: Secondary | ICD-10-CM | POA: Diagnosis not present

## 2016-11-08 DIAGNOSIS — E11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: Secondary | ICD-10-CM | POA: Diagnosis not present

## 2016-11-19 DIAGNOSIS — Z1231 Encounter for screening mammogram for malignant neoplasm of breast: Secondary | ICD-10-CM | POA: Diagnosis not present

## 2016-11-19 DIAGNOSIS — Z803 Family history of malignant neoplasm of breast: Secondary | ICD-10-CM | POA: Diagnosis not present

## 2017-02-07 DIAGNOSIS — Z7984 Long term (current) use of oral hypoglycemic drugs: Secondary | ICD-10-CM | POA: Diagnosis not present

## 2017-02-07 DIAGNOSIS — E1165 Type 2 diabetes mellitus with hyperglycemia: Secondary | ICD-10-CM | POA: Diagnosis not present

## 2017-02-19 ENCOUNTER — Other Ambulatory Visit: Payer: Self-pay | Admitting: Family Medicine

## 2017-02-19 ENCOUNTER — Ambulatory Visit
Admission: RE | Admit: 2017-02-19 | Discharge: 2017-02-19 | Disposition: A | Discharge status: Home / Self Care | Payer: Medicare Other | Source: Ambulatory Visit | Attending: Family Medicine | Admitting: Family Medicine

## 2017-02-19 DIAGNOSIS — J189 Pneumonia, unspecified organism: Secondary | ICD-10-CM | POA: Diagnosis not present

## 2017-02-27 DIAGNOSIS — J189 Pneumonia, unspecified organism: Secondary | ICD-10-CM | POA: Diagnosis not present

## 2017-05-07 DIAGNOSIS — E1165 Type 2 diabetes mellitus with hyperglycemia: Secondary | ICD-10-CM | POA: Diagnosis not present

## 2017-05-08 DIAGNOSIS — I739 Peripheral vascular disease, unspecified: Secondary | ICD-10-CM | POA: Diagnosis not present

## 2017-05-08 DIAGNOSIS — E11319 Type 2 diabetes mellitus with unspecified diabetic retinopathy without macular edema: Secondary | ICD-10-CM | POA: Diagnosis not present

## 2017-05-08 DIAGNOSIS — Z0000 Encounter for general adult medical examination without abnormal findings: Secondary | ICD-10-CM | POA: Diagnosis not present

## 2017-05-08 DIAGNOSIS — E1165 Type 2 diabetes mellitus with hyperglycemia: Secondary | ICD-10-CM | POA: Diagnosis not present

## 2017-05-08 DIAGNOSIS — E11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: Secondary | ICD-10-CM | POA: Diagnosis not present

## 2017-05-08 DIAGNOSIS — E7800 Pure hypercholesterolemia, unspecified: Secondary | ICD-10-CM | POA: Diagnosis not present

## 2017-05-08 DIAGNOSIS — I10 Essential (primary) hypertension: Secondary | ICD-10-CM | POA: Diagnosis not present

## 2017-05-08 DIAGNOSIS — I6529 Occlusion and stenosis of unspecified carotid artery: Secondary | ICD-10-CM | POA: Diagnosis not present

## 2017-05-15 DIAGNOSIS — E11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: Secondary | ICD-10-CM | POA: Diagnosis not present

## 2017-06-11 DIAGNOSIS — E113292 Type 2 diabetes mellitus with mild nonproliferative diabetic retinopathy without macular edema, left eye: Secondary | ICD-10-CM | POA: Diagnosis not present

## 2017-06-11 DIAGNOSIS — H35372 Puckering of macula, left eye: Secondary | ICD-10-CM | POA: Diagnosis not present

## 2017-06-11 DIAGNOSIS — E113393 Type 2 diabetes mellitus with moderate nonproliferative diabetic retinopathy without macular edema, bilateral: Secondary | ICD-10-CM | POA: Diagnosis not present

## 2017-06-20 DIAGNOSIS — D1801 Hemangioma of skin and subcutaneous tissue: Secondary | ICD-10-CM | POA: Diagnosis not present

## 2017-06-20 DIAGNOSIS — Z85828 Personal history of other malignant neoplasm of skin: Secondary | ICD-10-CM | POA: Diagnosis not present

## 2017-06-20 DIAGNOSIS — D2272 Melanocytic nevi of left lower limb, including hip: Secondary | ICD-10-CM | POA: Diagnosis not present

## 2017-06-20 DIAGNOSIS — L812 Freckles: Secondary | ICD-10-CM | POA: Diagnosis not present

## 2017-06-20 DIAGNOSIS — D225 Melanocytic nevi of trunk: Secondary | ICD-10-CM | POA: Diagnosis not present

## 2017-06-20 DIAGNOSIS — D2261 Melanocytic nevi of right upper limb, including shoulder: Secondary | ICD-10-CM | POA: Diagnosis not present

## 2017-06-20 DIAGNOSIS — D2262 Melanocytic nevi of left upper limb, including shoulder: Secondary | ICD-10-CM | POA: Diagnosis not present

## 2017-06-20 DIAGNOSIS — D485 Neoplasm of uncertain behavior of skin: Secondary | ICD-10-CM | POA: Diagnosis not present

## 2017-06-20 DIAGNOSIS — D2271 Melanocytic nevi of right lower limb, including hip: Secondary | ICD-10-CM | POA: Diagnosis not present

## 2017-07-12 ENCOUNTER — Other Ambulatory Visit: Payer: Self-pay | Admitting: Internal Medicine

## 2017-07-12 DIAGNOSIS — I6523 Occlusion and stenosis of bilateral carotid arteries: Secondary | ICD-10-CM

## 2017-07-18 ENCOUNTER — Encounter (HOSPITAL_COMMUNITY): Payer: Medicare Other

## 2017-07-19 ENCOUNTER — Other Ambulatory Visit: Payer: Self-pay | Admitting: Internal Medicine

## 2017-07-23 ENCOUNTER — Ambulatory Visit (HOSPITAL_COMMUNITY)
Admission: RE | Admit: 2017-07-23 | Discharge: 2017-07-23 | Disposition: A | Discharge status: Home / Self Care | Payer: Medicare Other | Source: Ambulatory Visit | Attending: Cardiovascular Disease | Admitting: Cardiovascular Disease

## 2017-07-23 ENCOUNTER — Ambulatory Visit (INDEPENDENT_AMBULATORY_CARE_PROVIDER_SITE_OTHER): Payer: Medicare Other | Admitting: Internal Medicine

## 2017-07-23 VITALS — BP 98/56 | HR 56 | Ht 64.0 in | Wt 162.0 lb

## 2017-07-23 DIAGNOSIS — I10 Essential (primary) hypertension: Secondary | ICD-10-CM

## 2017-07-23 DIAGNOSIS — E785 Hyperlipidemia, unspecified: Secondary | ICD-10-CM

## 2017-07-23 DIAGNOSIS — I6523 Occlusion and stenosis of bilateral carotid arteries: Secondary | ICD-10-CM

## 2017-07-23 DIAGNOSIS — Z87891 Personal history of nicotine dependence: Secondary | ICD-10-CM | POA: Diagnosis not present

## 2017-07-23 DIAGNOSIS — E11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: Secondary | ICD-10-CM | POA: Insufficient documentation

## 2017-07-23 DIAGNOSIS — I779 Disorder of arteries and arterioles, unspecified: Secondary | ICD-10-CM | POA: Diagnosis not present

## 2017-07-23 DIAGNOSIS — I739 Peripheral vascular disease, unspecified: Secondary | ICD-10-CM

## 2017-07-23 NOTE — Patient Instructions (Signed)
Your physician wants you to follow-up in: ONE YEAR with Dr. Hilty. You will receive a reminder letter in the mail two months in advance. If you don't receive a letter, please call our office to schedule the follow-up appointment.  

## 2017-07-24 NOTE — Progress Notes (Signed)
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 Recently, she was having some increasing shortness of breath with exertion and I recommended an echocardiogram. That echocardiogram performed on November 20, 2011, demonstrated a borderline concentric LVH, EF greater than 55% and mild LV relaxation abnormality. Left atrium was normal size, but there was noted to be calcified mass near the left atrial appendage. There was mitral annular calcification and there was no evidence of atrial septal defect. There was no aortic stenosis, but some mild calcification of the aortic valve leaflets. I obtained a CT scan which did not show any significant mass in the atrium. She reports doing fairly well however recently had screening tests including carotid artery Dopplers which indicated mild left internal carotid artery stenosis and moderate right internal carotid artery stenosis. She also had LE dopplers which indicated a mildly reduced ABI of 0.77 on the left.  She denies any chest pain, dypsnea on exertion or other related symptoms.  [name] returns today for followup. She is without complaints. She tells me that she is now looking forward to retirement which should be around Christmas. That means no more tax seasons. She denies any chest pain or worsening shortness of breath. She is not visibly been taking her Lipitor every day which would be ideal.  A [name] back in the office today for follow-up. She tells me that she's actually retiring tomorrow which I congratulated her on. She says she is going to be taking up more competitive pool playing in her spare time. Overall she's feeling well denies any chest pain or worsening shortness of breath. It's  been about 2 years since we've ultrasound her carotid and lower extremities. She denies any claudication although does notice some dependent rubor to her feet. 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 Recent laboratory work shows good cholesterol control with a total cholesterol around 1:15 LDL around 80. She seems to be tolerating atorvastatin. She is on low-dose aspirin.  07/10/2016  [name] returns today for follow-up. She is doing very well. Over the past year since she's been retired she's taken up the game of pool and plays in 2 weeks about 3-4 nights a week. She brought a list of her blood pressures with her which shows pretty good control in general she was between the 120s and 140s. In mid August she had noted some lower blood pressures and her primary care provider cut back her morning dose to 50 mg of metoprolol and 100 mg at night. Blood pressures accordingly are slightly higher but still fairly well-controlled. She was asymptomatic with her heart rates in the 50s. She denies any chest pain or worsening shortness of breath. EKG shows sinus rhythm with a borderline first-degree AV block.  07/23/2017  [name] was seen today in follow-up.  She is enjoying her retirement continues to exhale in pool playing.  She denies any chest pain or worsening shortness of breath.  Her blood pressures well controlled at 98/56 today, which she says is unusually low for her.  S she denies any significant palpitations.  She reports reasonable control of her diabetes.  Labs from [place] indicate hemoglobin A1c of 7.3 in August 2018,  and total cholesterol 142, triglycerides 174, HDL 37, LDL 71 and non-HDL 106.  PMHx:  Past Medical History:  Diagnosis Date  . Diabetes mellitus without complication ([place])   . Hyperlipidemia   . Hypertension 11/20/11   ECHO- EF>55% Borderline concentric left ventricular hypertrophy. There is a small  calcified mass in the L:A near the LA appendage. No valvular masses seen with associated mitral annular calcification. LA Volume/ BSA27.4 ml/m2 No AS. Right ventricular systolic pressure is elevated at 27mmHg.    Past Surgical History:  Procedure Laterality Date  . ABDOMINAL HYSTERECTOMY      FAMHx:  Family History  Problem Relation Age of Onset  . Stroke Mother   . Hypertension Mother   . Hyperlipidemia Mother   . Cancer - Prostate Father   . Cancer - Colon Father   . Melanoma Brother     SOCHx:   reports that she quit smoking about 45 years ago. She does not have any smokeless tobacco history on file. She reports that she does not drink alcohol or use drugs.  ALLERGIES:  Allergies  Allergen Reactions  . Demerol [Meperidine]   . Scopolamine     ROS: A comprehensive review of systems was negative.  HOME MEDS: Current Outpatient Prescriptions  Medication Sig Dispense Refill  . aspirin 81 MG tablet Take 81 mg by mouth daily.    Marland Kitchen atorvastatin (LIPITOR) 20 MG tablet Take 20 mg by mouth. 3 times weekly    . Biotin (BIOTIN 5000) 5 MG CAPS Take by mouth. 5 times weekly    . Cholecalciferol (VITAMIN D-3) 1000 UNITS CAPS Take 1,000 Units by mouth daily.    Marland Kitchen CINNAMON PO Take 1,000 mg by mouth 2 (two) times daily.     Marland Kitchen diltiazem (CARTIA XT) 240 MG 24 hr capsule Take 240 mg by mouth daily.    . fish oil-omega-3 fatty acids 1000 MG capsule Take 1 g by mouth daily.    . hydrochlorothiazide (HYDRODIURIL) 25 MG tablet Take 25 mg by mouth daily.    Marland Kitchen JANUVIA 100 MG tablet TAKE ONE TABLET BY MOUTH ONCE DAILY 30 tablet 10  . Magnesium 500 MG TABS Take 1 tablet by mouth 4 (four) times a week.    . metFORMIN (GLUCOPHAGE) 1000 MG tablet Take 1,000 mg by mouth 2 (two) times daily with a meal.    . metoprolol (LOPRESSOR) 100 MG tablet Take half tab in the morning and a whole tab in the evening. 45 tablet 3  . omeprazole (PRILOSEC) 20 MG capsule Take 20 mg by mouth once a week.     Marland Kitchen  PROBIOTIC CAPS Take by mouth as needed.    Marland Kitchen GLIPIZIDE XL 10 MG 24 hr tablet Take 1 tablet by mouth daily.  0  . losartan (COZAAR) 100 MG tablet Take 100 mg by mouth daily.  1   No current facility-administered medications for this visit.     LABS/IMAGING: No results found for this or any previous visit (from the past 48 hour(s)). No results found.  VITALS: BP (!) 98/56   Pulse (!) 56   Ht 5\' 4"  (1.626 m)   Wt 162 lb (73.5 kg)   BMI 27.81 kg/m   EXAM: General appearance: alert and no distress Neck: no carotid bruit, no JVD and thyroid not enlarged, symmetric, no tenderness/mass/nodules Lungs: clear to auscultation bilaterally Heart: regular rate and rhythm, S1, S2 normal, no murmur, click, rub or gallop Abdomen: soft, non-tender; bowel sounds normal; no masses,  no organomegaly Extremities: extremities normal, atraumatic, no cyanosis or edema Pulses: 2+ and symmetric Skin: Pale, warm, dry Neurologic: Grossly normal Psych: Pleasant  EKG: Sinus bradycardia 56, LVH with IVCD-personally reviewed  ASSESSMENT: 1. Bilateral mild carotid artery disease 2. PAD-asymptomatic, with reduced ABI on the left and reduced TBI's bilaterally 3. Overweight 4. Hypertension 5. Dyslipidemia  PLAN: 1.   Mrs. Nie is no new complaints.  Her cholesterol is essentially at goal.  She is due for repeat carotid Dopplers today which we will follow-up on.  She denies any symptoms of claudication.  Blood pressure is also well controlled.  We will continue her current medications.  Plan to see her back annually or sooner as necessary.  Pixie Casino, MD, [place] Director of the [facility] Attending Cardiologist  Direct Dial: [phone]  Fax: [phone]  Website:  [email or web address] 07/24/2017, 4:33 PM

## 2017-07-25 ENCOUNTER — Other Ambulatory Visit: Payer: Self-pay | Admitting: *Deleted

## 2017-07-25 DIAGNOSIS — I6523 Occlusion and stenosis of bilateral carotid arteries: Secondary | ICD-10-CM

## 2017-08-20 ENCOUNTER — Other Ambulatory Visit: Payer: Self-pay | Admitting: Internal Medicine

## 2017-08-20 NOTE — Telephone Encounter (Signed)
Rx(s) sent to pharmacy electronically.  

## 2017-08-30 ENCOUNTER — Ambulatory Visit (INDEPENDENT_AMBULATORY_CARE_PROVIDER_SITE_OTHER): Payer: Medicare Other | Admitting: Podiatry

## 2017-08-30 ENCOUNTER — Encounter: Payer: Self-pay | Admitting: Podiatry

## 2017-08-30 DIAGNOSIS — L601 Onycholysis: Secondary | ICD-10-CM

## 2017-08-30 DIAGNOSIS — I6523 Occlusion and stenosis of bilateral carotid arteries: Secondary | ICD-10-CM | POA: Diagnosis not present

## 2017-08-30 DIAGNOSIS — B351 Tinea unguium: Secondary | ICD-10-CM | POA: Diagnosis not present

## 2017-08-30 MED ORDER — CEPHALEXIN 500 MG PO CAPS: 500.0000 mg | ORAL_CAPSULE | Freq: Three times a day (TID) | ORAL | 0 refills | Status: DC

## 2017-08-30 NOTE — Patient Instructions (Addendum)

## 2017-08-30 NOTE — Progress Notes (Signed)
Subjective:    Patient ID: [name], female    DOB: 28-Jun-1941, 76 y.o.   MRN: [id number]  HPI  76 year old female presents the office today because it looks like her right big toenails coming off.  She states the nails been removed 3 times.  She states that about 1 week ago the nail had become loose.  She denies any drainage or pus coming from the area and she denies any swelling or redness.  She is diabetic her last A1c was 7.1 in September.  She had no recent treatment for this.  She has no other concerns today.   Review of Systems  All other systems reviewed and are negative.  Past Medical History:  Diagnosis Date  . Diabetes mellitus without complication ([place])   . Hyperlipidemia   . Hypertension 11/20/11   ECHO- EF>55% Borderline concentric left ventricular hypertrophy. There is a small calcified mass in the L:A near the LA appendage. No valvular masses seen with associated mitral annular calcification. LA Volume/ BSA27.4 ml/m2 No AS. Right ventricular systolic pressure is elevated at 19mmHg.    Past Surgical History:  Procedure Laterality Date  . ABDOMINAL HYSTERECTOMY       Current Outpatient Medications:  .  aspirin 81 MG tablet, Take 81 mg by mouth daily., Disp: , Rfl:  .  atorvastatin (LIPITOR) 20 MG tablet, Take 20 mg by mouth. 3 times weekly, Disp: , Rfl:  .  Biotin (BIOTIN 5000) 5 MG CAPS, Take by mouth. 5 times weekly, Disp: , Rfl:  .  cephALEXin (KEFLEX) 500 MG capsule, Take 1 capsule (500 mg total) by mouth 3 (three) times daily., Disp: 28 capsule, Rfl: 0 .  Cholecalciferol (VITAMIN D-3) 1000 UNITS CAPS, Take 1,000 Units by mouth daily., Disp: , Rfl:  .  CINNAMON PO, Take 1,000 mg by mouth 2 (two) times daily. , Disp: , Rfl:  .  diltiazem (CARTIA XT) 240 MG 24 hr capsule, Take 240 mg by mouth daily., Disp: , Rfl:  .  fish oil-omega-3 fatty acids 1000 MG capsule, Take 1 g by mouth daily., Disp: , Rfl:  .  GLIPIZIDE XL 10 MG 24 hr tablet, Take 1 tablet by mouth  daily., Disp: , Rfl: 0 .  hydrochlorothiazide (HYDRODIURIL) 25 MG tablet, Take 25 mg by mouth daily., Disp: , Rfl:  .  JANUVIA 100 MG tablet, TAKE ONE TABLET BY MOUTH ONCE DAILY, Disp: 30 tablet, Rfl: 0 .  losartan (COZAAR) 100 MG tablet, Take 100 mg by mouth daily., Disp: , Rfl: 1 .  Magnesium 500 MG TABS, Take 1 tablet by mouth 4 (four) times a week., Disp: , Rfl:  .  metFORMIN (GLUCOPHAGE) 1000 MG tablet, Take 1,000 mg by mouth 2 (two) times daily with a meal., Disp: , Rfl:  .  metoprolol (LOPRESSOR) 100 MG tablet, Take half tab in the morning and a whole tab in the evening., Disp: 45 tablet, Rfl: 3 .  omeprazole (PRILOSEC) 20 MG capsule, Take 20 mg by mouth once a week. , Disp: , Rfl:  .  PROBIOTIC CAPS, Take by mouth as needed., Disp: , Rfl:   Allergies  Allergen Reactions  . Demerol [Meperidine]   . Scopolamine     Social History   Socioeconomic History  . Marital status: Widowed    Spouse name: Not on file  . Number of children: Not on file  . Years of education: Not on file  . Highest education level: Not on file  Social Needs  .  Financial resource strain: Not on file  . Food insecurity - worry: Not on file  . Food insecurity - inability: Not on file  . Transportation needs - medical: Not on file  . Transportation needs - non-medical: Not on file  Occupational History  . Not on file  Tobacco Use  . Smoking status: Former Smoker    Last attempt to quit: 03/31/1972    Years since quitting: 45.4  . Smokeless tobacco: Never Used  Substance and Sexual Activity  . Alcohol use: No  . Drug use: No  . Sexual activity: Not on file  Other Topics Concern  . Not on file  Social History Narrative  . Not on file        Objective:   Physical Exam  General: AAO x3, NAD  Dermatological: The right hallux toenail is very loose and the underlying nail bed.   There is no surrounding erythema, ascending cellulitis.  There is no fluctuation or crepitation.  There is no malodor.   There is no clinical signs of infection noted.  In general the toenail is hypertrophic, dystrophic, discolored with yellow-brown discoloration.  There is no other open lesions or pre-ulcerative lesion identified today.  Vascular: Dorsalis Pedis artery and Posterior Tibial artery pedal pulses are 2/4 bilateral with immedate capillary fill time. There is no pain with calf compression, swelling, warmth, erythema.   Neruologic: Grossly intact via light touch bilateral. Protective threshold with Semmes Wienstein monofilament intact to all pedal sites bilateral.   Musculoskeletal: No gross boney pedal deformities bilateral. No pain, crepitus, or limitation noted with foot and ankle range of motion bilateral. Muscular strength 5/5 in all groups tested bilateral.      Assessment & Plan:  76 year old female presents with onycholysis right hallux toenail -Treatment options discussed including all alternatives, risks, and complications -Etiology of symptoms were discussed -Today discussed nail removal.  On clinical exam the nail is very loosening and I nail bed and only attached along the very proximal nail border.I was easily able to remove the entire toenail feel that any complications and only a small amount of bleeding occurred.  The removal there is no pain to the area and she tolerated this well without any complications.  I do this after verbal consent was obtained.  The area was cleaned.  Antibiotic ointment was applied followed by a bandage and allow her to continue with this on a daily basis as well.  There is no clinical signs of infection noted today but I want her to continue to monitor closely for any signs or symptoms of infection.  She verbally understood this. -RTC 2 weeks to ensure adequate healing or sooner if needed.  Trula Slade DPM

## 2017-09-02 ENCOUNTER — Ambulatory Visit: Payer: Medicare Other | Admitting: Podiatry

## 2017-09-10 ENCOUNTER — Encounter: Payer: Self-pay | Admitting: Podiatry

## 2017-09-10 ENCOUNTER — Ambulatory Visit (INDEPENDENT_AMBULATORY_CARE_PROVIDER_SITE_OTHER): Payer: Medicare Other | Admitting: Podiatry

## 2017-09-10 DIAGNOSIS — I6523 Occlusion and stenosis of bilateral carotid arteries: Secondary | ICD-10-CM

## 2017-09-10 DIAGNOSIS — L601 Onycholysis: Secondary | ICD-10-CM | POA: Diagnosis not present

## 2017-09-10 NOTE — Patient Instructions (Signed)
You can mix 3 tablespoons of coconut oil and 10-15 drops of tee tree oil together and apply to nails once a day

## 2017-09-11 NOTE — Progress Notes (Signed)
Subjective: [name] presents the office today for follow-up evaluation of right hallux on the lysis.  She states that the left second toe is starting to the same thing and is very loose.  She denies any drainage or pus coming from the area.  She is in the right big toe is doing well and she is been keeping antibiotic ointment and a bandage of the area daily.  She denies any swelling or redness to her feet.  She has no other concerns today. Denies any systemic complaints such as fevers, chills, nausea, vomiting. No acute changes since last appointment, and no other complaints at this time.   Objective: AAO x3, NAD DP/PT pulses palpable bilaterally, CRT less than 3 seconds The left second digit toenail appears to be very loose and is able to debride the nail.  The nail came off easily without any complications or bleeding and there is a granular wound present the nail bed.  There is no drainage or pus.  There is no fluctuation or crepitation.  There is no malodor.  On the right hallux toenail the nail bed appears to be healing well there is a small granular area.  There is no drainage or pus or redness or swelling. No other open lesions or pre-ulcerative lesions. There is no pain with calf compression, swelling, warmth, erythema.  Assessment: Onycholysis with superficial granular wound without signs of infection  Plan: -All treatment options discussed with the patient including all alternatives, risks, complications.  -I was easily able to remove the left second digit toenails without any complications as it was significantly loosened.  Antibiotic ointment to this area as well as the right big toe daily.  Keep  with a bandage overlying the area. Monitor for any clinical signs or symptoms of infection and directed to call the office immediately should any occur or go to the ER. -She went discussed natural treatment options for nail fungus.  Discussed the coconut/tea tree oil -Follow-up as scheduled or  sooner if needed. -Patient encouraged to call the office with any questions, concerns, change in symptoms.   Trula Slade DPM

## 2017-09-19 ENCOUNTER — Other Ambulatory Visit: Payer: Self-pay | Admitting: Internal Medicine

## 2017-09-20 ENCOUNTER — Other Ambulatory Visit: Payer: Self-pay | Admitting: Internal Medicine

## 2017-09-22 ENCOUNTER — Other Ambulatory Visit: Payer: Self-pay | Admitting: Internal Medicine

## 2017-09-23 ENCOUNTER — Other Ambulatory Visit: Payer: Self-pay

## 2017-10-01 ENCOUNTER — Ambulatory Visit (INDEPENDENT_AMBULATORY_CARE_PROVIDER_SITE_OTHER): Payer: Medicare Other | Admitting: Podiatry

## 2017-10-01 DIAGNOSIS — M79675 Pain in left toe(s): Secondary | ICD-10-CM

## 2017-10-01 DIAGNOSIS — M79674 Pain in right toe(s): Secondary | ICD-10-CM | POA: Diagnosis not present

## 2017-10-01 DIAGNOSIS — L601 Onycholysis: Secondary | ICD-10-CM | POA: Diagnosis not present

## 2017-10-01 DIAGNOSIS — B351 Tinea unguium: Secondary | ICD-10-CM | POA: Diagnosis not present

## 2017-10-02 NOTE — Progress Notes (Signed)
Subjective: [name] presents the office today for follow-up evaluation of a wound to the right big toenail as well as the left second toenail which came off.  She states these 2 areas of healed very well and she has not had any drainage or pus.  She states that her left big toenail has started to loosen up and she has had some drainage coming from underneath the toenail as well and the toenail needs to come off.  She is also asking for the remainder of the toenails be trimmed as they are thick and discolored and painful and she cannot trim them herself.  Denies any systemic complaints such as fevers, chills, nausea, vomiting. No acute changes since last appointment, and no other complaints at this time.   Objective: AAO x3, NAD DP/PT pulses palpable bilaterally, CRT less than 3 seconds The right hallux and second digit toenail that has healed there is no wound present.  The left hallux toenail is very loose and only attached on the very proximal nail corner and there is superficial purulence identified directly underneath the toenail.  I was easily able to remove the toenail today with minimal bleeding.  After debridement there was no further drainage or pus identified in the nail bed appeared to be healthy.  There is no surrounding erythema, ascending sialitis.  There is no fluctuance or crepitus or any clinical signs of infection noted.  Overall the nails on the left third through fifth in the right second through fifth digits are hypertrophic, dystrophic, discolored and there is tenderness palpation of the nails.  No surrounding redness or drainage. No open lesions or pre-ulcerative lesions.  No pain with calf compression, swelling, warmth, erythema  Assessment: Left hallux onycholysis, symptomatic onychomycosis  Plan: -All treatment options discussed with the patient including all alternatives, risks, complications.  -Nailbeds to the left second and right hallux toenail are healed. -The nails of  the left hallux was very loose.  I was able to remove the toenail today without any complications with minimal bleeding.  The nail bed appeared to be healthy and there is no further illness identified after cleaning the area.  Recommended antibiotic ointment and a bandage daily.  Monitor for any signs or symptoms of infection. -Nails sharply debrided x7 without any complications or bleeding. -Follow-up as scheduled or sooner if needed. -Patient encouraged to call the office with any questions, concerns, change in symptoms.   Trula Slade DPM

## 2017-10-15 ENCOUNTER — Encounter: Payer: Self-pay | Admitting: Podiatry

## 2017-10-15 ENCOUNTER — Ambulatory Visit (INDEPENDENT_AMBULATORY_CARE_PROVIDER_SITE_OTHER): Payer: Medicare Other | Admitting: Podiatry

## 2017-10-15 DIAGNOSIS — L601 Onycholysis: Secondary | ICD-10-CM

## 2017-10-15 DIAGNOSIS — Z9889 Other specified postprocedural states: Secondary | ICD-10-CM

## 2017-10-15 MED ORDER — CEPHALEXIN 500 MG PO CAPS: 500.0000 mg | ORAL_CAPSULE | Freq: Three times a day (TID) | ORAL | 0 refills | Status: DC

## 2017-10-16 NOTE — Progress Notes (Signed)
Subjective: [name] presents the office today for nail check.  The left hallux toenail was loose last appointment and it was removed.  She states that this toe is not healed but the other ones are as clear as it has.  She denies any drainage or pus coming from the area as there is still a wound on the nail bed that she wants to have area checked.  She has no pain to the area and she denies any surrounding redness or red streaks and she denies any pus. Denies any systemic complaints such as fevers, chills, nausea, vomiting. No acute changes since last appointment, and no other complaints at this time.   Objective: AAO x3, NAD DP/PT pulses palpable bilaterally, CRT less than 3 seconds Left hallux toenail has a granular wound base with a small amount of fibrotic tissue.  Is able to wipe off the fibrotic tissue and is a healthy, granular wound base present.  No significant surrounding erythema although small faint rim is present and this is likely more from inflammation as opposed to infection.  There is no ascending cellulitis.  No pus.  No ascending cellulitis.  No warmth.  No malodor. No open lesions or pre-ulcerative lesions.  No pain with calf compression, swelling, warmth, erythema  Assessment: Wound left hallux status post nail removal  Plan: -All treatment options discussed with the patient including all alternatives, risks, complications.  -Wound appears to be healthy after I cleaned it.  Will start Keflex in case of infection although unlikely.  Epsom salt soaks daily.  Antibiotic ointment and a bandage on the day but leave the area uncovered at night.  Monitor any signs or symptoms of infection call the office should any occur.  Otherwise I will see her back in about a week or sooner if needed.  She agrees this plan has no further questions or concerns. -Patient encouraged to call the office with any questions, concerns, change in symptoms.   Trula Slade DPM

## 2017-11-01 ENCOUNTER — Ambulatory Visit (INDEPENDENT_AMBULATORY_CARE_PROVIDER_SITE_OTHER): Payer: Medicare Other | Admitting: Podiatry

## 2017-11-01 ENCOUNTER — Encounter: Payer: Self-pay | Admitting: Podiatry

## 2017-11-01 DIAGNOSIS — L97521 Non-pressure chronic ulcer of other part of left foot limited to breakdown of skin: Secondary | ICD-10-CM | POA: Diagnosis not present

## 2017-11-06 NOTE — Progress Notes (Signed)
Subjective: [name] presents the office today for nail check to the left left hallux toenail was loose last appointment and it was removed.  She states that this toe is not healed but the other ones are as clear as it has.  She denies any drainage or pus coming from the area as there is still a wound on the nail bed that she wants to have area checked.  She has no pain to the area and she denies any surrounding redness or red streaks and she denies any pus. Denies any systemic complaints such as fevers, chills, nausea, vomiting. No acute changes since last appointment, and no other complaints at this time.   Her blood sugar this morning was 127  Objective: AAO x3, NAD DP/PT pulses palpable bilaterally, CRT less than 3 seconds Left hallux toenail has a granular wound base but is much smaller. It is about 0.1 x 0.1 cm opening and there is no probing, undermining or tunneling. There is no fluctuance or crepitance or malodor. No signs of infection. Remaining toenail has healed.  No discomfort to the area. No open lesions or pre-ulcerative lesions.  No pain with calf compression, swelling, warmth, erythema  Assessment: Wound left hallux status post nail removal  Plan: -All treatment options discussed with the patient including all alternatives, risks, complications.  -Wound was cleaned today.  Appears to cream and there is no signs of infection noted.  Continue antibiotic ointment dressing changes during the day and keep it uncovered at nighttime.  Applied a small amount of silver nitrate to the area today. Monitor for any clinical signs or symptoms of infection and directed to call the office immediately should any occur or go to the ER. -Follow-up in 2 weeks if not healed or sooner if needed.  Encouraged to call any questions or concerns or any change in symptoms.  Trula Slade DPM

## 2017-11-18 DIAGNOSIS — I10 Essential (primary) hypertension: Secondary | ICD-10-CM | POA: Diagnosis not present

## 2017-11-18 DIAGNOSIS — E7800 Pure hypercholesterolemia, unspecified: Secondary | ICD-10-CM | POA: Diagnosis not present

## 2017-11-18 DIAGNOSIS — E1165 Type 2 diabetes mellitus with hyperglycemia: Secondary | ICD-10-CM | POA: Diagnosis not present

## 2017-11-18 DIAGNOSIS — I739 Peripheral vascular disease, unspecified: Secondary | ICD-10-CM | POA: Diagnosis not present

## 2017-11-18 DIAGNOSIS — I6529 Occlusion and stenosis of unspecified carotid artery: Secondary | ICD-10-CM | POA: Diagnosis not present

## 2017-11-18 DIAGNOSIS — K219 Gastro-esophageal reflux disease without esophagitis: Secondary | ICD-10-CM | POA: Diagnosis not present

## 2017-11-18 DIAGNOSIS — E11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: Secondary | ICD-10-CM | POA: Diagnosis not present

## 2017-11-20 DIAGNOSIS — Z1231 Encounter for screening mammogram for malignant neoplasm of breast: Secondary | ICD-10-CM | POA: Diagnosis not present

## 2017-11-20 DIAGNOSIS — Z803 Family history of malignant neoplasm of breast: Secondary | ICD-10-CM | POA: Diagnosis not present

## 2017-11-22 ENCOUNTER — Ambulatory Visit: Payer: Self-pay | Admitting: Podiatry

## 2018-01-30 ENCOUNTER — Ambulatory Visit (INDEPENDENT_AMBULATORY_CARE_PROVIDER_SITE_OTHER): Payer: Medicare Other | Admitting: Podiatry

## 2018-01-30 ENCOUNTER — Encounter: Payer: Self-pay | Admitting: Podiatry

## 2018-01-30 ENCOUNTER — Ambulatory Visit (INDEPENDENT_AMBULATORY_CARE_PROVIDER_SITE_OTHER): Payer: Medicare Other

## 2018-01-30 VITALS — Temp 98.6°F

## 2018-01-30 DIAGNOSIS — S90122A Contusion of left lesser toe(s) without damage to nail, initial encounter: Secondary | ICD-10-CM

## 2018-01-30 DIAGNOSIS — L601 Onycholysis: Secondary | ICD-10-CM | POA: Diagnosis not present

## 2018-01-30 MED ORDER — CEPHALEXIN 500 MG PO CAPS: 500.0000 mg | ORAL_CAPSULE | Freq: Three times a day (TID) | ORAL | 0 refills | Status: DC

## 2018-02-03 NOTE — Progress Notes (Signed)
Subjective: 77 year old female presents the office today for concerns of the left third toenail becoming loose she is also drainage from the toenail.  She states that she hit it while at the pool hall.  She has had some swelling to the toe. She has some clear bloody drainage coming from the toenail but denies any pus.  Denies any other injury. Denies any systemic complaints such as fevers, chills, nausea, vomiting. No acute changes since last appointment, and no other complaints at this time.   Objective: AAO x3, NAD DP/PT pulses palpable bilaterally, CRT less than 3 seconds Absent sensation.  Left third digit toenail is loose from the underlying nail bed there is clear drainage coming from the toenail but there is no pus.  There is localized edema and erythema to the toe but there is no ascending sialitis or increase in warmth. No  , erythema, increase in warmth to bilateral lower extremities.  No open lesions or pre-ulcerative lesions.  No pain with calf compression, swelling, warmth, erythema  Assessment: Onychomycosis left third digit toenail with erythema/edema  Plan: -All treatment options discussed with the patient including all alternatives, risks, complications.  -X-rays were obtained and reviewed there is no evidence of acute fracture identified today. -The toenail was loose from the underlying nail bed.  Continue with absent sensation is able to easily remove the toenail feel that any complications of total and there is no underlying laceration present.  The nailbed was granular.  Areas cleaned with alcohol.  Antibiotic ointment and a bandage was applied.  Postprocedure instructions were discussed.  Started Keflex.  Monitoring signs or symptoms of infection.  Follow-up in 1 week or sooner if needed. -Patient encouraged to call the office with any questions, concerns, change in symptoms.   Trula Slade DPM

## 2018-02-07 ENCOUNTER — Ambulatory Visit (INDEPENDENT_AMBULATORY_CARE_PROVIDER_SITE_OTHER): Payer: Medicare Other | Admitting: Podiatry

## 2018-02-07 DIAGNOSIS — L601 Onycholysis: Secondary | ICD-10-CM

## 2018-02-07 NOTE — Patient Instructions (Signed)
Continue soaking in epsom salts twice a day followed by antibiotic ointment and a band-aid. Can leave uncovered at night. Continue this until completely healed.  If the area has not healed in 2 weeks, call the office for follow-up appointment, or sooner if any problems arise.  Monitor for any signs/symptoms of infection. Call the office immediately if any occur or go directly to the emergency room. Call with any questions/concerns.  

## 2018-02-10 NOTE — Progress Notes (Signed)
Subjective: 77 year old female presents the office today for follow-up evaluation of a left third digit toenail removal.  She is been on antibiotics and she says the redness has come down quite a bit she does not have any pain to the toe she has not had any drainage or pus.  No red streaks.  No pain although she has neuropathy.  She has no other concerns.  She has been cleaning with soap and water but she has not been soaking on a regular basis or keeping area covered.  She does clean with peroxide and Betadine.  Denies any systemic complaints such as fevers, chills, nausea, vomiting. No acute changes since last appointment, and no other complaints at this time.   Objective: AAO x3, NAD DP/PT pulses palpable bilaterally, CRT less than 3 seconds Nailbed to the left third digit appears to be healed well and scab is starting to form.  There is decrease edema and erythema to the third toe.  There is no drainage or pus coming from the area and there is no ascending cellulitis.  There is no fluctuation or crepitation or any malodor.  No open lesions or pre-ulcerative lesions.  No pain with calf compression, swelling, warmth, erythema  Assessment: Status post left third digit nail avulsion with improving cellulitis  Plan: -All treatment options discussed with the patient including all alternatives, risks, complications.  -Post pulmonary continue to soak the foot in Epsom salts daily keep covered with antibiotic ointment and a bandage.  Finish course of antibiotics.  If there is not completely healed in 2 weeks let me know or if the redness has not completely resolved.  Also let me know if there is any worsening she agrees with this plan has no further questions. -Patient encouraged to call the office with any questions, concerns, change in symptoms.   Trula Slade DPM

## 2018-02-21 ENCOUNTER — Ambulatory Visit: Payer: Self-pay | Admitting: Podiatry

## 2018-03-03 DIAGNOSIS — E1165 Type 2 diabetes mellitus with hyperglycemia: Secondary | ICD-10-CM | POA: Diagnosis not present

## 2018-03-03 DIAGNOSIS — I739 Peripheral vascular disease, unspecified: Secondary | ICD-10-CM | POA: Diagnosis not present

## 2018-03-03 DIAGNOSIS — E11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: Secondary | ICD-10-CM | POA: Diagnosis not present

## 2018-03-03 DIAGNOSIS — E7800 Pure hypercholesterolemia, unspecified: Secondary | ICD-10-CM | POA: Diagnosis not present

## 2018-03-03 DIAGNOSIS — I10 Essential (primary) hypertension: Secondary | ICD-10-CM | POA: Diagnosis not present

## 2018-03-03 DIAGNOSIS — E113293 Type 2 diabetes mellitus with mild nonproliferative diabetic retinopathy without macular edema, bilateral: Secondary | ICD-10-CM | POA: Diagnosis not present

## 2018-05-01 ENCOUNTER — Encounter: Payer: Self-pay | Admitting: Endocrinology

## 2018-05-01 ENCOUNTER — Ambulatory Visit (INDEPENDENT_AMBULATORY_CARE_PROVIDER_SITE_OTHER): Payer: Medicare Other | Admitting: Endocrinology

## 2018-05-01 VITALS — BP 130/62 | HR 68 | Ht 64.0 in | Wt 159.6 lb

## 2018-05-01 DIAGNOSIS — E1165 Type 2 diabetes mellitus with hyperglycemia: Secondary | ICD-10-CM | POA: Diagnosis not present

## 2018-05-01 LAB — POCT GLYCOSYLATED HEMOGLOBIN (HGB A1C): Hemoglobin A1C: 7.7 % — AB (ref 4.0–5.6)

## 2018-05-01 MED ORDER — CANAGLIFLOZIN 100 MG PO TABS: ORAL_TABLET | ORAL | 3 refills | Status: DC

## 2018-05-01 MED ORDER — GLIPIZIDE ER 5 MG PO TB24: 5.0000 mg | ORAL_TABLET | Freq: Every day | ORAL | 1 refills | Status: DC

## 2018-05-01 NOTE — Patient Instructions (Addendum)
Check blood sugars on waking up  3/7  days  Also check blood sugars about 2 hours after a meal and do this after different meals by rotation  Recommended blood sugar levels on waking up is 90-130 and about 2 hours after meal is 130-160  Please bring your blood sugar monitor to each visit, thank you  Precautions with starting Invokana  Increase fluid intake After going to the bathroom for urination make sure you are clean and dry,  rinse if needed to prevent yeast infections STOP hydrochlorothiazide and monitor blood pressure daily  REDUCE glipizide to 5 mg, new prescription to be sent

## 2018-05-01 NOTE — Progress Notes (Signed)
Patient ID: [name], female   DOB: 04/09/1941, 77 y.o.   MRN: [id number]          Reason for Appointment: Consultation for Type 2 Diabetes  Referring physician: Darcus Austin   History of Present Illness:          Date of diagnosis of type 2 diabetes mellitus: 1984       Background history:   She has been on metformin since about the time of her diagnosis and has been on other drugs also but has not had many changes in her regimen in the last several years She thinks her blood sugar used to be fairly well controlled At some point she was given Byetta for about a year and a half which improved her control but this was subsequently start because of it being too expensive for her.  She had no side effects with this  Recent history:       Her A1c done by her PCP in June was 8.2 and is now 7.7  Non-insulin hypoglycemic drugs the patient is taking are: Glipizide ER 10 mg daily, Januvia 100 mg daily, metformin 1 g twice daily  Current management, blood sugar patterns and problems identified:  Blood sugars have been poorly controlled for about a year with A1c staying over 8% 2 times in a row  She checks blood sugars only in the morning and not after meals usually  Over the last couple of months because of her sugars being persistently high she has tried to cut back on carbohydrates like bread and she thinks her blood sugars are improving  However has not lost any weight  Although she is limited with her activity level because of hip pain she tries to walk 15 minutes up to 4 days a week  More recently her fasting blood sugars have been averaging about 160 with some variability  However highest blood sugar was 270 after lunch last month  She is still concerned about the cost of medications especially brand-name          Side effects from medications have been: None  Compliance with the medical regimen: Improved Hypoglycemia:   None  Glucose monitoring:  done only 1 times a  day         Glucometer: One Touch.       Blood Glucose readings by time of day and averages from meter download:  PREMEAL Breakfast Lunch Dinner Bedtime  Overall   Glucose range:  127-205      Median:  161    164   POST-MEAL PC Breakfast PC Lunch PC Dinner  Glucose range:   270 154   Median:      Self-care: The diet that the patient has been following is: tries to limit sweets and fried food.     Meal times are:  Breakfast is at 8:30 AM lunch: 1 PM, dinner: 6:30 PM  Typical meal intake: Breakfast is oatmeal with fruit Lunch is usually soup with crackers, dinner is usually a low-fat meat with vegetables Snacks will be celery and ranch dressing               Dietician visit, most recent: At diagnosis               Exercise: Walking 15 minutes, 4 days a week    Weight history:  Wt Readings from Last 3 Encounters:  05/01/18 159 lb 9.6 oz (72.4 kg)  07/23/17 162 lb (73.5 kg)  07/09/16  163 lb 3.2 oz (74 kg)    Glycemic control:   Lab Results  Component Value Date   HGBA1C 7.7 (A) 05/01/2018   Lab Results  Component Value Date   LDLCALC 87 04/06/2013   No results found for: MICRALBCREAT  No results found for: FRUCTOSAMINE    Allergies as of 05/01/2018      Reactions   Demerol [meperidine]    Scopolamine       Medication List        Accurate as of 05/01/18 11:59 PM. Always use your most recent med list.          aspirin 81 MG tablet Take 81 mg by mouth daily.   atorvastatin 20 MG tablet Commonly known as:  LIPITOR Take 20 mg by mouth daily.   BIOTIN 5000 5 MG Caps Generic drug:  Biotin Take by mouth. 5 times weekly   canagliflozin 100 MG Tabs tablet Commonly known as:  INVOKANA 1 tablet before breakfast   CARTIA XT 240 MG 24 hr capsule Generic drug:  diltiazem Take 240 mg by mouth daily.   CINNAMON PO Take 1,000 mg by mouth 2 (two) times daily.   fish oil-omega-3 fatty acids 1000 MG capsule Take 1 g by mouth daily. TAKE 1 TABLET BY MOUTH 4 TIMES  WEEKLY.   glipiZIDE 5 MG 24 hr tablet Commonly known as:  GLUCOTROL XL Take 1 tablet (5 mg total) by mouth daily with breakfast.   hydrochlorothiazide 25 MG tablet Commonly known as:  HYDRODIURIL Take 25 mg by mouth daily.   JANUVIA 100 MG tablet Generic drug:  sitaGLIPtin TAKE ONE TABLET BY MOUTH ONCE DAILY   losartan 100 MG tablet Commonly known as:  COZAAR Take 100 mg by mouth daily.   Magnesium 500 MG Tabs Take 0.5 tablets by mouth daily.   metFORMIN 1000 MG tablet Commonly known as:  GLUCOPHAGE Take 1,000 mg by mouth 2 (two) times daily with a meal.   metoprolol tartrate 100 MG tablet Commonly known as:  LOPRESSOR Take half tab in the morning and a whole tab in the evening.   omeprazole 20 MG capsule Commonly known as:  PRILOSEC Take 20 mg by mouth 2 (two) times a week.   Probiotic Caps Take by mouth as needed.   vitamin C 1000 MG tablet Take 1,000 mg by mouth daily.   VITAMIN K2-VITAMIN D3 PO Take by mouth. Take 1 tablet by mouth 3 times weekly       Allergies:  Allergies  Allergen Reactions  . Demerol [Meperidine]   . Scopolamine     Past Medical History:  Diagnosis Date  . Diabetes mellitus without complication ([place])   . Hyperlipidemia   . Hypertension 11/20/11   ECHO- EF>55% Borderline concentric left ventricular hypertrophy. There is a small calcified mass in the L:A near the LA appendage. No valvular masses seen with associated mitral annular calcification. LA Volume/ BSA27.4 ml/m2 No AS. Right ventricular systolic pressure is elevated at 60mmHg.    Past Surgical History:  Procedure Laterality Date  . ABDOMINAL HYSTERECTOMY      Family History  Problem Relation Age of Onset  . Stroke Mother   . Hypertension Mother   . Hyperlipidemia Mother   . Cancer - Prostate Father   . Cancer - Colon Father   . Melanoma Brother     Social History:  reports that she quit smoking about 46 years ago. She has never used smokeless tobacco. She  reports that she does  not drink alcohol or use drugs.   Review of Systems  Constitutional: Negative for weight loss.  HENT: Negative for headaches.   Eyes: Negative for blurred vision.  Respiratory: Negative for shortness of breath.   Cardiovascular: Negative for leg swelling and claudication.  Gastrointestinal: Negative for diarrhea.  Endocrine: Negative for fatigue.  Genitourinary: Positive for nocturia.  Musculoskeletal: Positive for joint pain. Negative for back pain.  Skin: Negative for rash.  Neurological: Negative for tingling.     Lipid history: Managed by PCP with 20 mg Lipitor    Lab Results  Component Value Date   CHOL 164 04/06/2013   HDL 40 04/06/2013   LDLCALC 87 04/06/2013   TRIG 187 (H) 04/06/2013           Hypertension: Present for several years, currently treated with HCTZ 25 mg, losartan 100 mg  BP Readings from Last 3 Encounters:  05/01/18 130/62  07/23/17 (!) 98/56  07/09/16 (!) 157/60    Most recent eye exam was in 2018  Most recent foot exam: 04/2018    LABS:  Office Visit on 05/01/2018  Component Date Value Ref Range Status  . Hemoglobin A1C 05/01/2018 7.7* 4.0 - 5.6 % Final    Physical Examination:  BP 130/62 (BP Location: Left Arm, Patient Position: Sitting, Cuff Size: Normal)   Pulse 68   Ht 5\' 4"  (1.626 m)   Wt 159 lb 9.6 oz (72.4 kg)   SpO2 98%   BMI 27.40 kg/m   GENERAL:         Patient has mild generalized obesity.    HEENT:         Eye exam shows normal external appearance.  Fundus exam shows no retinopathy.  Oral exam shows normal mucosa .  NECK:   There is no lymphadenopathy  Thyroid is not enlarged and no nodules felt.  Carotids are normal to palpation and no bruit heard  LUNGS:         Chest is symmetrical. Lungs are clear to auscultation.Marland Kitchen   HEART:         Heart sounds:  S1 and S2 are normal. No murmur or click heard., no S3 or S4.   ABDOMEN:   There is no distention present. Liver and spleen are not  palpable.  No other mass or tenderness present.    NEUROLOGICAL:   Ankle jerks are 1+ bilaterally.    Diabetic Foot Exam - Simple   Simple Foot Form Diabetic Foot exam was performed with the following findings:  Yes 05/01/2018  2:53 PM  Visual Inspection No deformities, no ulcerations, no other skin breakdown bilaterally:  Yes Sensation Testing Intact to touch and monofilament testing bilaterally:  Yes See comments:  Yes Pulse Check See comments:  Yes Comments Slightly decreased monofilament sensation in toes Absent pedal pulses Mild dusky discoloration of distal feet, not cold            Vibration sense is mildly reduced in distal first toes. MUSCULOSKELETAL:  There is no swelling or deformity of the peripheral joints.     EXTREMITIES:     There is no edema.  SKIN:       No rash or lesions of concern.        ASSESSMENT:  Diabetes type 2, uncontrolled  See history of present illness for detailed discussion of current diabetes management, blood sugar patterns and problems identified  Her A1c is 7.7 which is still relatively high although better than 8.2 that she had earlier  this year  Currently on a regimen of glipizide, Januvia and metformin with worsening control over the last year She is generally fairly good with her diet and is trying to exercise as much as possible Explained to the patient that she has had some progression of her diabetes and lack of response from her usual 3 drug regimen and blood sugars are still not optimal even though improved somewhat  She also does not monitor her blood sugars after meals which may be higher than her fastings  Complications of diabetes: None evident, did have microalbuminuria in 2/19  Hyperlipidemia: Last LDL was still over 100, followed by PCP, likely needs higher doses of Lipitor High triglycerides may be related partly to her inadequate diabetes control   Hypertension: Well controlled and also monitored at home by patient,  is on an ARB drug  PLAN:    Patient needs a more effective treatment regimen for ongoing control and also added benefits of cardiovascular risk reduction long-term  She is a good candidate for either an SGLT2 drug or a GLP-1 drug but she has limitations of out-of-pocket expense when she is in the Medicare donut hole  Most likely she can start with an SGLT2 drugs and Invokana is covered by her insurance  Discussed action of SGLT 2 drugs on lowering glucose by decreasing kidney absorption of glucose, benefits of weight loss and lower blood pressure, indications for cardiovascular risk reduction, possible side effects including candidiasis, discussion about warnings on the package insert and dosage regimen   With starting Invokana she will stop HCTZ since her blood pressure at home is generally quite normal.  If need be she can go back to HCTZ half tablet daily if blood pressure rises significantly  She will need to reduce her glipizide ER to 5 mg daily to prevent hypoglycemia and this may be able to be tapered off  She will continue Januvia for now  She will need to start checking blood sugars at least every other day after meals discussed 2-hour postprandial targets  Follow-up in 4 weeks    Patient Instructions  Check blood sugars on waking up  3/7  days  Also check blood sugars about 2 hours after a meal and do this after different meals by rotation  Recommended blood sugar levels on waking up is 90-130 and about 2 hours after meal is 130-160  Please bring your blood sugar monitor to each visit, thank you  Precautions with starting Invokana  Increase fluid intake After going to the bathroom for urination make sure you are clean and dry,  rinse if needed to prevent yeast infections STOP hydrochlorothiazide and monitor blood pressure daily  REDUCE glipizide to 5 mg, new prescription to be sent      Consultation note has been sent to the referring physician  Elayne Snare 05/02/2018, 8:02 AM   Note: This office note was prepared with Dragon voice recognition system technology. Any transcriptional errors that result from this process are unintentional.

## 2018-05-22 DIAGNOSIS — Z0000 Encounter for general adult medical examination without abnormal findings: Secondary | ICD-10-CM | POA: Diagnosis not present

## 2018-05-22 DIAGNOSIS — Z5181 Encounter for therapeutic drug level monitoring: Secondary | ICD-10-CM | POA: Diagnosis not present

## 2018-05-22 DIAGNOSIS — E11319 Type 2 diabetes mellitus with unspecified diabetic retinopathy without macular edema: Secondary | ICD-10-CM | POA: Diagnosis not present

## 2018-05-22 DIAGNOSIS — H9319 Tinnitus, unspecified ear: Secondary | ICD-10-CM | POA: Diagnosis not present

## 2018-05-22 DIAGNOSIS — E7800 Pure hypercholesterolemia, unspecified: Secondary | ICD-10-CM | POA: Diagnosis not present

## 2018-05-22 DIAGNOSIS — Z23 Encounter for immunization: Secondary | ICD-10-CM | POA: Diagnosis not present

## 2018-05-22 DIAGNOSIS — B351 Tinea unguium: Secondary | ICD-10-CM | POA: Diagnosis not present

## 2018-05-22 DIAGNOSIS — E1165 Type 2 diabetes mellitus with hyperglycemia: Secondary | ICD-10-CM | POA: Diagnosis not present

## 2018-05-22 DIAGNOSIS — Z1211 Encounter for screening for malignant neoplasm of colon: Secondary | ICD-10-CM | POA: Diagnosis not present

## 2018-05-22 DIAGNOSIS — I10 Essential (primary) hypertension: Secondary | ICD-10-CM | POA: Diagnosis not present

## 2018-05-22 DIAGNOSIS — K219 Gastro-esophageal reflux disease without esophagitis: Secondary | ICD-10-CM | POA: Diagnosis not present

## 2018-05-30 ENCOUNTER — Other Ambulatory Visit (INDEPENDENT_AMBULATORY_CARE_PROVIDER_SITE_OTHER): Payer: Medicare Other

## 2018-05-30 DIAGNOSIS — E1165 Type 2 diabetes mellitus with hyperglycemia: Secondary | ICD-10-CM

## 2018-05-30 LAB — BASIC METABOLIC PANEL
BUN: 21 mg/dL (ref 6–23)
CHLORIDE: 105 meq/L (ref 96–112)
CO2: 30 meq/L (ref 19–32)
CREATININE: 0.91 mg/dL (ref 0.40–1.20)
Calcium: 10.2 mg/dL (ref 8.4–10.5)
GFR: 63.85 mL/min (ref >60.00)
Glucose, Bld: 135 mg/dL — ABNORMAL HIGH (ref 70–99)
POTASSIUM: 5 meq/L (ref 3.5–5.1)
Sodium: 141 mEq/L (ref 135–145)

## 2018-05-31 LAB — FRUCTOSAMINE: FRUCTOSAMINE: 267 umol/L (ref 0–285)

## 2018-06-01 NOTE — Progress Notes (Signed)
Patient ID: [name], female   DOB: 1940/12/05, 77 y.o.   MRN: [id number]          Reason for Appointment: for Type 2 Diabetes  Referring physician: Darcus Austin   History of Present Illness:          Date of diagnosis of type 2 diabetes mellitus: 1984       Background history:   She has been on metformin since about the time of her diagnosis and has been on other drugs also but has not had many changes in her regimen in the last several years She thinks her blood sugar used to be fairly well controlled At some point she was given Byetta for about a year and a half which improved her control but this was subsequently start because of it being too expensive for her.  She had no side effects with this  Recent history:       Her A1c done by her PCP in June was 8.2 and is last 7.7 Fructosamine is 267 but no previous comparison available  Non-insulin hypoglycemic drugs the patient is taking are: Glipizide ER 5 mg daily, Invokana 100 mg daily, Januvia 100 mg daily, metformin 1 g twice daily  Current management, blood sugar patterns and problems identified:  She was started on Invokana in 8/19  This was because of her A1c being over 8% previously and her blood sugar is averaging 160 in the morning  Blood sugars recently are variable in the morning and on an average still not better even though she is taking her Invokana regularly none  However her glipizide has been reduced as a precaution to 5 mg  Despite reminders she does not check her readings after meals  However she has had blood sugars as low as 108 fasting and lab glucose was 135  No side effects like yeast infections with Invokana  Her blood sugars are randomly going up to 200 or 300+ midday and afternoon She thinks she is taking her other medications consistently as before       Side effects from medications have been: None  Compliance with the medical regimen: Improved Hypoglycemia:   None  Glucose monitoring:   done only 1 times a day         Glucometer: One Touch.       Blood Glucose readings by time of day and averages from meter download:  PRE-MEAL Fasting Lunch Dinner Bedtime Overall  Glucose range:  108-207  194, 332     Mean/median:  162    162   POST-MEAL PC Breakfast PC Lunch PC Dinner  Glucose range:   127, 220   Mean/median:       Previous records:  PREMEAL Breakfast Lunch Dinner Bedtime  Overall   Glucose range:  127-205      Median:  161    164   POST-MEAL PC Breakfast PC Lunch PC Dinner  Glucose range:   270 154   Median:      Self-care: The diet that the patient has been following is: tries to limit sweets and fried food.     Meal times are:  Breakfast is at 8:30 AM lunch: 1 PM, dinner: 6:30 PM  Typical meal intake: Breakfast is oatmeal with fruit Lunch is usually soup with crackers, dinner is usually a low-fat meat with vegetables Snacks will be celery and ranch dressing               Dietician visit,  most recent: At diagnosis               Exercise: Walking 15 minutes, 4 days a week    Weight history:  Wt Readings from Last 3 Encounters:  06/02/18 158 lb (71.7 kg)  05/01/18 159 lb 9.6 oz (72.4 kg)  07/23/17 162 lb (73.5 kg)    Glycemic control:   Lab Results  Component Value Date   HGBA1C 7.7 (A) 05/01/2018   Lab Results  Component Value Date   LDLCALC 87 04/06/2013   CREATININE 0.91 05/30/2018   No results found for: MICRALBCREAT  Lab Results  Component Value Date   FRUCTOSAMINE 267 05/30/2018      Allergies as of 06/02/2018      Reactions   Demerol [meperidine]    Scopolamine       Medication List        Accurate as of 06/02/18 11:31 AM. Always use your most recent med list.          aspirin 81 MG tablet Take 81 mg by mouth daily.   atorvastatin 20 MG tablet Commonly known as:  LIPITOR Take 20 mg by mouth daily.   BIOTIN 5000 5 MG Caps Generic drug:  Biotin Take by mouth. 5 times weekly   canagliflozin 100 MG Tabs  tablet Commonly known as:  INVOKANA 1 tablet before breakfast   CARTIA XT 240 MG 24 hr capsule Generic drug:  diltiazem Take 240 mg by mouth daily.   CINNAMON PO Take 1,000 mg by mouth 2 (two) times daily.   fish oil-omega-3 fatty acids 1000 MG capsule Take 1 g by mouth daily. TAKE 1 TABLET BY MOUTH 4 TIMES WEEKLY.   glipiZIDE 5 MG 24 hr tablet Commonly known as:  GLUCOTROL XL Take 1 tablet (5 mg total) by mouth daily with breakfast.   hydrochlorothiazide 25 MG tablet Commonly known as:  HYDRODIURIL Take 25 mg by mouth daily.   JANUVIA 100 MG tablet Generic drug:  sitaGLIPtin TAKE ONE TABLET BY MOUTH ONCE DAILY   losartan 100 MG tablet Commonly known as:  COZAAR Take 100 mg by mouth daily.   Magnesium 500 MG Tabs Take 0.5 tablets by mouth daily.   metFORMIN 1000 MG tablet Commonly known as:  GLUCOPHAGE Take 1,000 mg by mouth 2 (two) times daily with a meal.   metoprolol tartrate 100 MG tablet Commonly known as:  LOPRESSOR Take half tab in the morning and a whole tab in the evening.   omeprazole 20 MG capsule Commonly known as:  PRILOSEC Take 20 mg by mouth 2 (two) times a week.   Probiotic Caps Take by mouth as needed.   vitamin C 1000 MG tablet Take 1,000 mg by mouth daily.   VITAMIN K2-VITAMIN D3 PO Take by mouth. Take 1 tablet by mouth 3 times weekly       Allergies:  Allergies  Allergen Reactions  . Demerol [Meperidine]   . Scopolamine     Past Medical History:  Diagnosis Date  . Diabetes mellitus without complication ([place])   . Hyperlipidemia   . Hypertension 11/20/11   ECHO- EF>55% Borderline concentric left ventricular hypertrophy. There is a small calcified mass in the L:A near the LA appendage. No valvular masses seen with associated mitral annular calcification. LA Volume/ BSA27.4 ml/m2 No AS. Right ventricular systolic pressure is elevated at 71mmHg.    Past Surgical History:  Procedure Laterality Date  . ABDOMINAL HYSTERECTOMY       Family History  Problem Relation Age of Onset  . Stroke Mother   . Hypertension Mother   . Hyperlipidemia Mother   . Cancer - Prostate Father   . Cancer - Colon Father   . Melanoma Brother     Social History:  reports that she quit smoking about 46 years ago. She has never used smokeless tobacco. She reports that she does not drink alcohol or use drugs.   Review of Systems   Lipid history: Managed by PCP with 20 mg Lipitor    Lab Results  Component Value Date   CHOL 164 04/06/2013   HDL 40 04/06/2013   LDLCALC 87 04/06/2013   TRIG 187 (H) 04/06/2013           Hypertension: Present for several years, currently treated with   losartan 100 mg  BP Readings from Last 3 Encounters:  06/02/18 (!) 156/66  05/01/18 130/62  07/23/17 (!) 98/56    Most recent eye exam was in 2018  Most recent foot exam: 04/2018    LABS:  Lab on 05/30/2018  Component Date Value Ref Range Status  . Sodium 05/30/2018 141  135 - 145 mEq/L Final  . Potassium 05/30/2018 5.0  3.5 - 5.1 mEq/L Final  . Chloride 05/30/2018 105  96 - 112 mEq/L Final  . CO2 05/30/2018 30  19 - 32 mEq/L Final  . Glucose, Bld 05/30/2018 135* 70 - 99 mg/dL Final  . BUN 05/30/2018 21  6 - 23 mg/dL Final  . Creatinine, Ser 05/30/2018 0.91  0.40 - 1.20 mg/dL Final  . Calcium 05/30/2018 10.2  8.4 - 10.5 mg/dL Final  . GFR 05/30/2018 63.85  >60.00 mL/min Final  . Fructosamine 05/30/2018 267  0 - 285 umol/L Final   Comment: Published reference interval for apparently healthy subjects between age 81 and 41 is 36 - 285 umol/L and in a poorly controlled diabetic population is 228 - 563 umol/L with a mean of 396 umol/L.     Physical Examination:  BP (!) 156/66   Pulse 64   Ht 5' 1.5" (1.562 m)   Wt 158 lb (71.7 kg)   SpO2 99%   BMI 29.37 kg/m       ASSESSMENT:  Diabetes type 2, uncontrolled  See history of present illness for detailed discussion of current diabetes management, blood sugar patterns and  problems identified  Her A1c is 7.7 in August Fructosamine however she indicates good control recently  Her fasting readings at home appear to be about the same overall compared to her last visit despite adding Invokana but are quite variable and ranging from 108-207 She may not be consistent on diet since her weight has not gone down despite starting Invokana Also her glipizide was reduced previously   Hypertension: Well controlled usually, higher today and she thinks this is from eating fast food on the weekend She will continue to monitor at home and continue her 3 drug regimen  PLAN:    She will continue Invokana unchanged; however if she needs a higher dosage may switch to [place] or Jardiance because of potential for hyperkalemia  Check A1c on the next visit  Consultation with dietitian  She will need to start checking blood sugars at least every other day after meals instead of just morning and does not need to check morning readings every day  Follow-up in 8 weeks    There are no Patient Instructions on file for this visit.     Elayne Snare 06/02/2018, 11:31 AM  Note: This office note was prepared with Dragon voice recognition system technology. Any transcriptional errors that result from this process are unintentional.  

## 2018-06-02 ENCOUNTER — Ambulatory Visit (INDEPENDENT_AMBULATORY_CARE_PROVIDER_SITE_OTHER): Payer: Medicare Other | Admitting: Endocrinology

## 2018-06-02 ENCOUNTER — Encounter: Payer: Self-pay | Admitting: Endocrinology

## 2018-06-02 VITALS — BP 156/66 | HR 64 | Ht 61.5 in | Wt 158.0 lb

## 2018-06-02 DIAGNOSIS — E1165 Type 2 diabetes mellitus with hyperglycemia: Secondary | ICD-10-CM | POA: Diagnosis not present

## 2018-06-02 NOTE — Patient Instructions (Addendum)
Check blood sugars on waking up  3/7 days  Also check blood sugars about 2 hours after a meal and do this after different meals by rotation  Recommended blood sugar levels on waking up is 90-130 and about 2 hours after meal is 130-160  Please bring your blood sugar monitor to each visit, thank you

## 2018-06-08 DIAGNOSIS — Z1211 Encounter for screening for malignant neoplasm of colon: Secondary | ICD-10-CM | POA: Diagnosis not present

## 2018-06-10 DIAGNOSIS — E113292 Type 2 diabetes mellitus with mild nonproliferative diabetic retinopathy without macular edema, left eye: Secondary | ICD-10-CM | POA: Diagnosis not present

## 2018-06-10 DIAGNOSIS — E113393 Type 2 diabetes mellitus with moderate nonproliferative diabetic retinopathy without macular edema, bilateral: Secondary | ICD-10-CM | POA: Diagnosis not present

## 2018-06-10 DIAGNOSIS — Z961 Presence of intraocular lens: Secondary | ICD-10-CM | POA: Diagnosis not present

## 2018-06-10 DIAGNOSIS — H35372 Puckering of macula, left eye: Secondary | ICD-10-CM | POA: Diagnosis not present

## 2018-06-13 DIAGNOSIS — H01024 Squamous blepharitis left upper eyelid: Secondary | ICD-10-CM | POA: Diagnosis not present

## 2018-06-13 DIAGNOSIS — H01021 Squamous blepharitis right upper eyelid: Secondary | ICD-10-CM | POA: Diagnosis not present

## 2018-06-13 DIAGNOSIS — H02831 Dermatochalasis of right upper eyelid: Secondary | ICD-10-CM | POA: Diagnosis not present

## 2018-06-13 DIAGNOSIS — H01025 Squamous blepharitis left lower eyelid: Secondary | ICD-10-CM | POA: Diagnosis not present

## 2018-06-13 DIAGNOSIS — H02834 Dermatochalasis of left upper eyelid: Secondary | ICD-10-CM | POA: Diagnosis not present

## 2018-06-13 DIAGNOSIS — H01022 Squamous blepharitis right lower eyelid: Secondary | ICD-10-CM | POA: Diagnosis not present

## 2018-06-23 ENCOUNTER — Telehealth: Payer: Self-pay

## 2018-06-23 DIAGNOSIS — Z79899 Other long term (current) drug therapy: Secondary | ICD-10-CM | POA: Diagnosis not present

## 2018-06-23 NOTE — Telephone Encounter (Signed)
Patient wants to know if she is supposed to be taking Januvia it does not state this in last office visit but she was told by her PCP that Dr. Dwyane Dee wants her to be taking it so she wants to make sure please advise

## 2018-06-23 NOTE — Telephone Encounter (Signed)
Please advise 

## 2018-06-23 NOTE — Telephone Encounter (Signed)
She was not told to stop Januvia and can continue

## 2018-06-24 ENCOUNTER — Other Ambulatory Visit: Payer: Self-pay

## 2018-06-24 MED ORDER — SITAGLIPTIN PHOSPHATE 100 MG PO TABS: 100.0000 mg | ORAL_TABLET | Freq: Every day | ORAL | 0 refills | Status: DC

## 2018-06-24 NOTE — Telephone Encounter (Signed)
Pt stated that she would like for you to take a look at her labs for her kidney she stated that she was told that her kidney function was getting worse,please advise

## 2018-06-24 NOTE — Telephone Encounter (Signed)
Pt informed

## 2018-06-24 NOTE — Telephone Encounter (Signed)
Kidney function as of 05/30/2018 is normal.  If she has any more recent reports they need to be faxed to [place]

## 2018-06-25 ENCOUNTER — Other Ambulatory Visit: Payer: Self-pay | Admitting: Endocrinology

## 2018-07-11 ENCOUNTER — Encounter: Payer: Medicare Other | Attending: Endocrinology | Admitting: Dietician

## 2018-07-11 ENCOUNTER — Encounter: Payer: Self-pay | Admitting: Dietician

## 2018-07-11 DIAGNOSIS — Z23 Encounter for immunization: Secondary | ICD-10-CM | POA: Diagnosis not present

## 2018-07-11 DIAGNOSIS — E113293 Type 2 diabetes mellitus with mild nonproliferative diabetic retinopathy without macular edema, bilateral: Secondary | ICD-10-CM | POA: Diagnosis not present

## 2018-07-11 DIAGNOSIS — I10 Essential (primary) hypertension: Secondary | ICD-10-CM | POA: Diagnosis not present

## 2018-07-11 DIAGNOSIS — F430 Acute stress reaction: Secondary | ICD-10-CM | POA: Diagnosis not present

## 2018-07-11 DIAGNOSIS — Z713 Dietary counseling and surveillance: Secondary | ICD-10-CM | POA: Insufficient documentation

## 2018-07-11 DIAGNOSIS — E1142 Type 2 diabetes mellitus with diabetic polyneuropathy: Secondary | ICD-10-CM

## 2018-07-11 DIAGNOSIS — E1165 Type 2 diabetes mellitus with hyperglycemia: Secondary | ICD-10-CM | POA: Diagnosis not present

## 2018-07-11 NOTE — Progress Notes (Signed)
Diabetes Self-Management Education  Visit Type: First/Initial  Appt. Start Time: 0905 Appt. End Time: 5681  07/11/2018  [name], identified by name and date of birth, is a 77 y.o. female with a diagnosis of Diabetes: Type 2.  History includes Type 2 Diabetes diagnosed in 1984, HTN, HLD, moderate retinopathy, and neuropathy.   Medications include Metformin, glipizide, Invokana, Januvia A1C 7.7% in August decreased from 8.2% in June.  05/30/18 GFR 63 and Fructosamine 267.  Goal weight 148 lbs 12 years ago 190 lbs and has gradually lost to current weight.    Patient lives alone.  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  She enjoys playing pool 4-5 times per week but gets little exercise other than this.  ASSESSMENT  Height 5' 1.5" (1.562 m), weight 156 lb (70.8 kg). Body mass index is 29 kg/m.  Diabetes Self-Management Education - 07/11/18 0919      Visit Information   Visit Type  First/Initial      Initial Visit   Diabetes Type  Type 2    Are you currently following a meal plan?  No    Are you taking your medications as prescribed?  Yes    Date Diagnosed  [place]   How would you rate your overall health?  Good      Psychosocial Assessment   Patient Belief/Attitude about Diabetes  Motivated to manage diabetes    Self-care barriers  None    Self-management support  Doctor's office    Other persons present  Patient    Patient Concerns  Nutrition/Meal planning;Glycemic Control;Weight Control    Special Needs  None    Preferred Learning Style  No preference indicated    Learning Readiness  Ready    How often do you need to have someone help you when you read instructions, pamphlets, or other written materials from your doctor or pharmacy?  1 - Never    What is the last grade level you completed in school?  Master's degree in accounting      Pre-Education Assessment   Patient understands the diabetes disease and  treatment process.  Needs Review    Patient understands incorporating nutritional management into lifestyle.  Needs Review    Patient undertands incorporating physical activity into lifestyle.  Needs Review    Patient understands using medications safely.  Needs Review    Patient understands monitoring blood glucose, interpreting and using results  Needs Review    Patient understands prevention, detection, and treatment of acute complications.  Needs Review    Patient understands prevention, detection, and treatment of chronic complications.  Needs Review    Patient understands how to develop strategies to address psychosocial issues.  Needs Review    Patient understands how to develop strategies to promote health/change behavior.  Needs Review      Complications   Last HgB A1C per patient/outside source  7.7 %   05/01/18 decreased from 8.2% in June   How often do you check your blood sugar?  1-2 times/day    Fasting Blood glucose range (mg/dL)  70-129;130-179    Number of hypoglycemic episodes per month  0    Number of hyperglycemic episodes per week  0    Have you had a dilated eye exam in the past 12 months?  Yes    Have you had a dental exam in the past 12 months?  No  Are you checking your feet?  Yes    How many days per week are you checking your feet?  7      Dietary Intake   Breakfast  [employer] toast, butter, fruit OR cereal OR egg AND coffee    Snack (morning)  decaf coffee    Lunch  1/2 [place] lettuce, tomato sandwich with mayo and tomato bisque OR small portion of brown rice, baked chicken, gravy, vegetable    Snack (afternoon)  raw vegetables, ranch (occasionally)    Dinner  fruit, vegetable plate with meat at times OR wrap OR 4 nights per week at the pool hall (grilled wrap OR naked wings, celery with ranch)    Snack (evening)  none    Beverage(s)  decaf coffee with stevia and cream, water, water with vinegar and 1 tsp honey, occasional [place], Almond Milk      Exercise    Exercise Type  Light (walking / raking leaves)   Plays pool 4-5 nights per week for the past 4 years, walks   How many days per week to you exercise?  3    How many minutes per day do you exercise?  20    Total minutes per week of exercise  60      Patient Education   Previous Diabetes Education  Yes (please comment)   when diagnosed in 1984   Nutrition management   Meal options for control of blood glucose level and chronic complications.;Meal timing in regards to the patients' current diabetes medication.    Physical activity and exercise   Role of exercise on diabetes management, blood pressure control and cardiac health.;Helped patient identify appropriate exercises in relation to his/her diabetes, diabetes complications and other health issue.    Medications  Reviewed patients medication for diabetes, action, purpose, timing of dose and side effects.    Monitoring  Identified appropriate SMBG and/or A1C goals.    Psychosocial adjustment  Role of stress on diabetes      Individualized Goals (developed by patient)   Nutrition  General guidelines for healthy choices and portions discussed    Physical Activity  Exercise 5-7 days per week;30 minutes per day    Medications  take my medication as prescribed    Monitoring   test my blood glucose as discussed    Reducing Risk  increase portions of healthy fats    Health Coping  discuss diabetes with (comment)   MD, RD, CDE     Post-Education Assessment   Patient understands the diabetes disease and treatment process.  Demonstrates understanding / competency    Patient understands incorporating nutritional management into lifestyle.  Demonstrates understanding / competency    Patient undertands incorporating physical activity into lifestyle.  Demonstrates understanding / competency    Patient understands using medications safely.  Demonstrates understanding / competency    Patient understands monitoring blood glucose, interpreting and  using results  Demonstrates understanding / competency    Patient understands prevention, detection, and treatment of acute complications.  Demonstrates understanding / competency    Patient understands prevention, detection, and treatment of chronic complications.  Demonstrates understanding / competency    Patient understands how to develop strategies to address psychosocial issues.  Demonstrates understanding / competency    Patient understands how to develop strategies to promote health/change behavior.  Demonstrates understanding / competency      Outcomes   Expected Outcomes  Demonstrated interest in learning. Expect positive outcomes    Future DMSE  PRN  Program Status  Completed       Individualized Plan for Diabetes Self-Management Training:   Learning Objective:  Patient will have a greater understanding of diabetes self-management. Patient education plan is to attend individual and/or group sessions per assessed needs and concerns.   Plan:   Patient Instructions  Continue with all of your great eating habits. Continue to be mindful. Find ways to be more active.  Consider Silver Sneakers.  Aim for 2-3 Carb Choices per meal (30-45 grams) +/- 1 either way  Aim for 0-1 Carbs per snack if hungry  Include protein in moderation with your meals and snacks Continue reading food labels for Total Carbohydrate and Fat Grams of foods   Expected Outcomes:  Demonstrated interest in learning. Expect positive outcomes  Education material provided: Food label handouts, A1C conversion sheet, Meal plan card, My Plate and Snack sheet, Living Well with Diabetes  If problems or questions, patient to contact team via:  Phone  Future DSME appointment: PRN

## 2018-07-11 NOTE — Patient Instructions (Signed)
Continue with all of your great eating habits. Continue to be mindful. Find ways to be more active.  Consider Silver Sneakers.  Aim for 2-3 Carb Choices per meal (30-45 grams) +/- 1 either way  Aim for 0-1 Carbs per snack if hungry  Include protein in moderation with your meals and snacks Continue reading food labels for Total Carbohydrate and Fat Grams of foods

## 2018-07-21 ENCOUNTER — Other Ambulatory Visit: Payer: Self-pay | Admitting: Endocrinology

## 2018-07-25 ENCOUNTER — Other Ambulatory Visit (INDEPENDENT_AMBULATORY_CARE_PROVIDER_SITE_OTHER): Payer: Medicare Other

## 2018-07-25 DIAGNOSIS — E1165 Type 2 diabetes mellitus with hyperglycemia: Secondary | ICD-10-CM

## 2018-07-25 LAB — COMPREHENSIVE METABOLIC PANEL
ALT: 17 U/L (ref 0–35)
AST: 18 U/L (ref 0–37)
Albumin: 4.4 g/dL (ref 3.5–5.2)
Alkaline Phosphatase: 52 U/L (ref 39–117)
BUN: 18 mg/dL (ref 6–23)
CHLORIDE: 105 meq/L (ref 96–112)
CO2: 31 mEq/L (ref 19–32)
CREATININE: 0.89 mg/dL (ref 0.40–1.20)
Calcium: 10.3 mg/dL (ref 8.4–10.5)
GFR: 65.48 mL/min (ref >60.00)
Glucose, Bld: 171 mg/dL — ABNORMAL HIGH (ref 70–99)
POTASSIUM: 4.6 meq/L (ref 3.5–5.1)
SODIUM: 142 meq/L (ref 135–145)
TOTAL PROTEIN: 6.7 g/dL (ref 6.0–8.3)
Total Bilirubin: 0.3 mg/dL (ref 0.2–1.2)

## 2018-07-25 LAB — HEMOGLOBIN A1C: Hgb A1c MFr Bld: 7.4 % — ABNORMAL HIGH (ref 4.6–6.5)

## 2018-07-28 ENCOUNTER — Ambulatory Visit (INDEPENDENT_AMBULATORY_CARE_PROVIDER_SITE_OTHER): Payer: Medicare Other | Admitting: Endocrinology

## 2018-07-28 ENCOUNTER — Encounter

## 2018-07-28 ENCOUNTER — Encounter: Payer: Self-pay | Admitting: Endocrinology

## 2018-07-28 VITALS — BP 162/62 | HR 62 | Ht 64.5 in | Wt 158.0 lb

## 2018-07-28 DIAGNOSIS — I6523 Occlusion and stenosis of bilateral carotid arteries: Secondary | ICD-10-CM | POA: Diagnosis not present

## 2018-07-28 DIAGNOSIS — E1165 Type 2 diabetes mellitus with hyperglycemia: Secondary | ICD-10-CM | POA: Diagnosis not present

## 2018-07-28 MED ORDER — CANAGLIFLOZIN 300 MG PO TABS: 300.0000 mg | ORAL_TABLET | Freq: Every day | ORAL | 3 refills | Status: DC

## 2018-07-28 NOTE — Progress Notes (Signed)
Patient ID: [name], female   DOB: 1941/08/23, 77 y.o.   MRN: [id number]          Reason for Appointment: Follow-up for endocrinology problems  Referring physician: Darcus Austin   History of Present Illness:          Date of diagnosis of type 2 diabetes mellitus: 1984       Background history:   She has been on metformin since about the time of her diagnosis and has been on other drugs also but has not had many changes in her regimen in the last several years She thinks her blood sugar used to be fairly well controlled At some point she was given Byetta for about a year and a half which improved her control but this was subsequently start because of it being too expensive for her.  She had no side effects with this  Recent history:   Her A1c is 7.4 slightly better, baseline over 8%  Non-insulin hypoglycemic drugs: Glipizide ER 5 mg daily, Invokana 100 mg daily, Januvia 100 mg daily, metformin 1 g twice daily  Current management, blood sugar patterns and problems identified:  Since her fructosamine was improving on her last visit no changes were made in her medications, Invokana was started on her initial consultation  However sugars have been periodically higher including the last few days lab fasting glucose was 175  Despite her seeing the dietitian a couple of weeks ago not lost any weight  She also has stressed over the last few days and not planning her meals or eating healthy meals as directed with blood sugars over 200 at times after meals  Again she tends to forget to check her sugars after meals        Side effects from medications have been: None  Compliance with the medical regimen: Improved Hypoglycemia:   None  Glucose monitoring:  done only 1 times a day         Glucometer: One Touch.       Blood Glucose readings by time of day and averages from meter download:   PRE-MEAL Fasting Lunch Dinner Bedtime Overall  Glucose range:  121-176      Mean/median:  134    137   POST-MEAL PC Breakfast PC Lunch PC Dinner  Glucose range:   163  242  Mean/median:      Previous readings:  PRE-MEAL Fasting Lunch Dinner Bedtime Overall  Glucose range:  108-207  194, 332     Mean/median:  162    162   POST-MEAL PC Breakfast PC Lunch PC Dinner  Glucose range:   127, 220   Mean/median:        Self-care: The diet that the patient has been following is: tries to limit sweets and fried food.     Meal times are:  Breakfast is at 8:30 AM lunch: 1 PM, dinner: 6:30 PM  Typical meal intake: Breakfast is oatmeal with fruit Lunch is usually soup with crackers, dinner is usually a low-fat meat with vegetables Snacks will be celery and ranch dressing               Dietician visit, most recent: At 10/18               Exercise: Walking 15 minutes, 2-3 days a week    Weight history:  Wt Readings from Last 3 Encounters:  07/29/18 157 lb (71.2 kg)  07/28/18 158 lb (71.7 kg)  07/11/18 156 lb (  70.8 kg)    Glycemic control:   Lab Results  Component Value Date   HGBA1C 7.4 (H) 07/25/2018   HGBA1C 7.7 (A) 05/01/2018   Lab Results  Component Value Date   LDLCALC 87 04/06/2013   CREATININE 0.89 07/25/2018   No results found for: MICRALBCREAT  Lab Results  Component Value Date   FRUCTOSAMINE 267 05/30/2018      Allergies as of 07/28/2018      Reactions   Demerol [meperidine]    Scopolamine       Medication List        Accurate as of 07/28/18 11:59 PM. Always use your most recent med list.          aspirin 81 MG tablet Take 81 mg by mouth daily.   atorvastatin 20 MG tablet Commonly known as:  LIPITOR Take 20 mg by mouth daily.   BIOTIN 5000 5 MG Caps Generic drug:  Biotin Take by mouth. 5 times weekly   canagliflozin 300 MG Tabs tablet Commonly known as:  INVOKANA Take 1 tablet (300 mg total) by mouth daily before breakfast.   CARTIA XT 240 MG 24 hr capsule Generic drug:  diltiazem Take 240 mg by mouth daily.   CINNAMON  PO Take 1,000 mg by mouth 2 (two) times daily.   fish oil-omega-3 fatty acids 1000 MG capsule Take 1 g by mouth daily. TAKE 1 TABLET BY MOUTH 4 TIMES WEEKLY.   glipiZIDE 5 MG 24 hr tablet Commonly known as:  GLUCOTROL XL TAKE 1 TABLET BY MOUTH ONCE DAILY WITH BREAKFAST   JANUVIA 100 MG tablet Generic drug:  sitaGLIPtin TAKE 1 TABLET BY MOUTH ONCE DAILY   losartan 100 MG tablet Commonly known as:  COZAAR Take 100 mg by mouth daily.   Magnesium 500 MG Tabs Take 0.5 tablets by mouth daily.   metFORMIN 1000 MG tablet Commonly known as:  GLUCOPHAGE Take 1,000 mg by mouth 2 (two) times daily with a meal.   metoprolol tartrate 100 MG tablet Commonly known as:  LOPRESSOR Take half tab in the morning and a whole tab in the evening.   omeprazole 20 MG capsule Commonly known as:  PRILOSEC Take 20 mg by mouth 2 (two) times a week.   Probiotic Caps Take by mouth as needed.   vitamin B-12 250 MCG tablet Commonly known as:  CYANOCOBALAMIN Take 1,000 mcg by mouth daily.   vitamin C 1000 MG tablet Take 1,000 mg by mouth daily.   VITAMIN K2-VITAMIN D3 PO Take by mouth. Take 1 tablet by mouth 3 times weekly       Allergies:  Allergies  Allergen Reactions  . Demerol [Meperidine]   . Scopolamine     Past Medical History:  Diagnosis Date  . Diabetes mellitus without complication ([place])   . Hyperlipidemia   . Hypertension 11/20/11   ECHO- EF>55% Borderline concentric left ventricular hypertrophy. There is a small calcified mass in the L:A near the LA appendage. No valvular masses seen with associated mitral annular calcification. LA Volume/ BSA27.4 ml/m2 No AS. Right ventricular systolic pressure is elevated at 71mmHg.    Past Surgical History:  Procedure Laterality Date  . ABDOMINAL HYSTERECTOMY      Family History  Problem Relation Age of Onset  . Cancer - Prostate Father   . Cancer - Colon Father   . Stroke Mother   . Hypertension Mother   . Hyperlipidemia Mother    . Melanoma Brother     Social History:  reports that she quit smoking about 46 years ago. She has never used smokeless tobacco. She reports that she does not drink alcohol or use drugs.   Review of Systems   Lipid history: Hypercholesterolemia by PCP with 20 mg Lipitor    Lab Results  Component Value Date   CHOL 164 04/06/2013   HDL 40 04/06/2013   LDLCALC 87 04/06/2013   TRIG 187 (H) 04/06/2013           Hypertension: Present for several years, currently treated with   losartan 100 mg HCTZ was held when starting Invokana on her first visit when blood pressure was normal She think blood pressure is higher at home also  BP Readings from Last 3 Encounters:  07/29/18 (!) 156/52  07/28/18 (!) 162/62  06/02/18 (!) 156/66    Most recent eye exam was in 2018  Most recent foot exam: 04/2018    LABS:  Lab on 07/25/2018  Component Date Value Ref Range Status  . Sodium 07/25/2018 142  135 - 145 mEq/L Final  . Potassium 07/25/2018 4.6  3.5 - 5.1 mEq/L Final  . Chloride 07/25/2018 105  96 - 112 mEq/L Final  . CO2 07/25/2018 31  19 - 32 mEq/L Final  . Glucose, Bld 07/25/2018 171* 70 - 99 mg/dL Final  . BUN 07/25/2018 18  6 - 23 mg/dL Final  . Creatinine, Ser 07/25/2018 0.89  0.40 - 1.20 mg/dL Final  . Total Bilirubin 07/25/2018 0.3  0.2 - 1.2 mg/dL Final  . Alkaline Phosphatase 07/25/2018 52  39 - 117 U/L Final  . AST 07/25/2018 18  0 - 37 U/L Final  . ALT 07/25/2018 17  0 - 35 U/L Final  . Total Protein 07/25/2018 6.7  6.0 - 8.3 g/dL Final  . Albumin 07/25/2018 4.4  3.5 - 5.2 g/dL Final  . Calcium 07/25/2018 10.3  8.4 - 10.5 mg/dL Final  . GFR 07/25/2018 65.48  >60.00 mL/min Final  . Hgb A1c MFr Bld 07/25/2018 7.4* 4.6 - 6.5 % Final   Glycemic Control Guidelines for People with Diabetes:Non Diabetic:  <6%Goal of Therapy: <7%Additional Action Suggested:  >8%     Physical Examination:  BP (!) 162/62   Pulse 62   Ht 5' 4.5" (1.638 m)   Wt 158 lb (71.7 kg)   SpO2 98%    BMI 26.70 kg/m       ASSESSMENT:  Diabetes type 2, uncontrolled  See history of present illness for detailed discussion of current diabetes management, blood sugar patterns and problems identified  Her A1c is 7.4 compared to 7.7 in August  As above she is on multiple medications including Invokana However blood sugars are not adequately controlled, averaging about 150 but likely has high readings after meals which she does not monitor Also recently morning readings are about 170 partly from poor diet  Discussed the need for improving her control with more potent agents Although she had previously taken Byetta she is still reluctant to switch to an injection to replace her Januvia  Her fasting readings at home appear to be about the same overall compared to her last visit despite adding Invokana but are quite variable and ranging from 108-207 She may not be consistent on diet since her weight has not gone down despite starting Invokana Also her glipizide was reduced previously   Hypertension: Well controlled usually, higher today and may be from her not taking HCTZ when though she is taking Invokana Electrolytes show potassium 4.6, previously  slightly higher   PLAN:    She will increase Invokana to 2 tablets now and then switch to 300 mg daily  Discussed use of a weekly GLP-1 injection and benefits if sugars are not consistently controlled  She needs to start improving her diet  More frequent exercise  For her HYPERTENSION she will go back to taking HCTZ and monitor at home  Check electrolytes on her follow-up visit and also renal function    Patient Instructions  Check blood sugars on waking up days a week  Also check blood sugars about 2 hours after meals and do this after different meals by rotation  Recommended blood sugar levels on waking up are 90-130 and about 2 hours after meal is 130-160  Please bring your blood sugar monitor to each visit, thank  you  200mg  Invokana daily  Restart HCTZ   Walk more often      Elayne Snare 07/29/2018, 3:43 PM   Note: This office note was prepared with Dragon voice recognition system technology. Any transcriptional errors that result from this process are unintentional.

## 2018-07-28 NOTE — Patient Instructions (Addendum)
Check blood sugars on waking up days a week  Also check blood sugars about 2 hours after meals and do this after different meals by rotation  Recommended blood sugar levels on waking up are 90-130 and about 2 hours after meal is 130-160  Please bring your blood sugar monitor to each visit, thank you  200mg  Invokana daily  Restart HCTZ   Walk more often

## 2018-07-29 ENCOUNTER — Ambulatory Visit (INDEPENDENT_AMBULATORY_CARE_PROVIDER_SITE_OTHER): Payer: Medicare Other | Admitting: Internal Medicine

## 2018-07-29 ENCOUNTER — Ambulatory Visit (HOSPITAL_COMMUNITY)
Admission: RE | Admit: 2018-07-29 | Discharge: 2018-07-29 | Disposition: A | Discharge status: Home / Self Care | Payer: Medicare Other | Source: Ambulatory Visit | Attending: Internal Medicine | Admitting: Internal Medicine

## 2018-07-29 ENCOUNTER — Encounter: Payer: Self-pay | Admitting: Internal Medicine

## 2018-07-29 VITALS — BP 156/52 | HR 57 | Ht 64.5 in | Wt 157.0 lb

## 2018-07-29 DIAGNOSIS — I10 Essential (primary) hypertension: Secondary | ICD-10-CM

## 2018-07-29 DIAGNOSIS — I6523 Occlusion and stenosis of bilateral carotid arteries: Secondary | ICD-10-CM

## 2018-07-29 DIAGNOSIS — E785 Hyperlipidemia, unspecified: Secondary | ICD-10-CM | POA: Diagnosis not present

## 2018-07-29 DIAGNOSIS — I739 Peripheral vascular disease, unspecified: Secondary | ICD-10-CM

## 2018-07-29 MED ORDER — TELMISARTAN 80 MG PO TABS: 80.0000 mg | ORAL_TABLET | Freq: Every day | ORAL | 3 refills | Status: DC
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
## 2018-07-29 NOTE — Patient Instructions (Addendum)
Medication Instructions:  STOP losartan START telmisartan 80mg  daily   If you need a refill on your cardiac medications before your next appointment, please call your pharmacy.   Follow-Up: At [facility], you and your health needs are our priority.  As part of our continuing mission to provide you with exceptional heart care, we have created designated Provider Care Teams.  These Care Teams include your primary Cardiologist (physician) and Advanced Practice Providers (APPs -  Physician Assistants and Nurse Practitioners) who all work together to provide you with the care you need, when you need it. . You will need a follow up appointment in about 1 month with Dr. Debara Pickett.   Any Other Special Instructions Will Be Listed Below (If Applicable).  Dr. Debara Pickett requests that check your blood pressure in the morning an then 1-2 hours after taking your BP medication(s).   HOW TO TAKE YOUR BLOOD PRESSURE:  Rest 5 minutes before taking your blood pressure.  Don't smoke or drink caffeinated beverages for at least 30 minutes before.  Take your blood pressure before (not after) you eat.  Sit comfortably with your back supported and both feet on the floor (don't cross your legs).  Elevate your arm to heart level on a table or a desk.  Use the proper sized cuff. It should fit smoothly and snugly around your bare upper arm. There should be enough room to slip a fingertip under the cuff. The bottom edge of the cuff should be 1 inch above the crease of the elbow.  Ideally, take 3 measurements at one sitting and record the average.

## 2018-07-31 ENCOUNTER — Other Ambulatory Visit: Payer: Self-pay | Admitting: *Deleted

## 2018-07-31 DIAGNOSIS — I6523 Occlusion and stenosis of bilateral carotid arteries: Secondary | ICD-10-CM

## 2018-08-06 DIAGNOSIS — D2239 Melanocytic nevi of other parts of face: Secondary | ICD-10-CM | POA: Diagnosis not present

## 2018-08-06 DIAGNOSIS — D1801 Hemangioma of skin and subcutaneous tissue: Secondary | ICD-10-CM | POA: Diagnosis not present

## 2018-08-06 DIAGNOSIS — D225 Melanocytic nevi of trunk: Secondary | ICD-10-CM | POA: Diagnosis not present

## 2018-08-06 DIAGNOSIS — D485 Neoplasm of uncertain behavior of skin: Secondary | ICD-10-CM | POA: Diagnosis not present

## 2018-08-06 DIAGNOSIS — D2261 Melanocytic nevi of right upper limb, including shoulder: Secondary | ICD-10-CM | POA: Diagnosis not present

## 2018-08-06 DIAGNOSIS — D2262 Melanocytic nevi of left upper limb, including shoulder: Secondary | ICD-10-CM | POA: Diagnosis not present

## 2018-08-06 DIAGNOSIS — Z85828 Personal history of other malignant neoplasm of skin: Secondary | ICD-10-CM | POA: Diagnosis not present

## 2018-08-06 DIAGNOSIS — D220 Melanocytic nevi of lip: Secondary | ICD-10-CM | POA: Diagnosis not present

## 2018-08-20 ENCOUNTER — Other Ambulatory Visit: Payer: Self-pay | Admitting: Endocrinology

## 2018-08-29 ENCOUNTER — Other Ambulatory Visit: Payer: Self-pay

## 2018-08-29 ENCOUNTER — Telehealth: Payer: Self-pay

## 2018-08-29 MED ORDER — METFORMIN HCL 1000 MG PO TABS: 1000.0000 mg | ORAL_TABLET | Freq: Two times a day (BID) | ORAL | 0 refills | Status: DC

## 2018-08-29 NOTE — Telephone Encounter (Signed)
Spoke with patient who reports she never started telmisartan 80mg . Per chart review, was prescribed on Tuesday 11/5 #90 with 3 refills to [facility]. She states she never got this. She states she did add back hctz 25mg  that her PCP had told her to stop previously d/t concerns of dehydration and continued on losartan 100mg . She reports her BP is running mid-high 130s/60s. She has f/up with cardiology on 09/04/18. Advised to continue medications until this visit

## 2018-08-29 NOTE — Telephone Encounter (Signed)
Thanks

## 2018-08-29 NOTE — Telephone Encounter (Signed)
[facility]-Refill req rec for HCTZ 25mg  1qd with last fill date of 7.25.2019 #90/I saw your last visit note and unsure if you are wanting to refill this or if patient is being compliant considering last fill date/thx dmf

## 2018-08-29 NOTE — Telephone Encounter (Signed)
We need to know if BP is improved and whether she wants combination Telmisartan/HCTZ or not.  Eliezer Lofts, can you reach out to her?  Thx.  Dr. Lemmie Evens

## 2018-09-04 ENCOUNTER — Ambulatory Visit (INDEPENDENT_AMBULATORY_CARE_PROVIDER_SITE_OTHER): Payer: Medicare Other | Admitting: Internal Medicine

## 2018-09-04 ENCOUNTER — Encounter: Payer: Self-pay | Admitting: Internal Medicine

## 2018-09-04 ENCOUNTER — Encounter (INDEPENDENT_AMBULATORY_CARE_PROVIDER_SITE_OTHER): Payer: Self-pay

## 2018-09-04 VITALS — BP 142/64 | HR 68 | Ht 64.5 in | Wt 152.8 lb

## 2018-09-04 DIAGNOSIS — I6523 Occlusion and stenosis of bilateral carotid arteries: Secondary | ICD-10-CM | POA: Diagnosis not present

## 2018-09-04 DIAGNOSIS — I739 Peripheral vascular disease, unspecified: Secondary | ICD-10-CM | POA: Diagnosis not present

## 2018-09-04 DIAGNOSIS — I10 Essential (primary) hypertension: Secondary | ICD-10-CM | POA: Diagnosis not present

## 2018-09-04 DIAGNOSIS — E785 Hyperlipidemia, unspecified: Secondary | ICD-10-CM

## 2018-09-04 MED ORDER — ATORVASTATIN CALCIUM 20 MG PO TABS: 20.0000 mg | ORAL_TABLET | Freq: Every day | ORAL | 3 refills | Status: DC

## 2018-09-04 MED ORDER — HYDROCHLOROTHIAZIDE 25 MG PO TABS: 25.0000 mg | ORAL_TABLET | Freq: Every day | ORAL | 3 refills | Status: DC

## 2018-09-04 MED ORDER — DILTIAZEM HCL ER COATED BEADS 240 MG PO CP24: 240.0000 mg | ORAL_CAPSULE | Freq: Every day | ORAL | 3 refills | Status: DC

## 2018-09-04 MED ORDER — TELMISARTAN 80 MG PO TABS: 80.0000 mg | ORAL_TABLET | Freq: Every day | ORAL | 3 refills | Status: DC
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
## 2018-09-04 NOTE — Patient Instructions (Signed)
Medication Instructions:  START telmisartan 80mg  (in place of losartan) If you need a refill on your cardiac medications before your next appointment, please call your pharmacy.   Lab work: NONE If you have labs (blood work) drawn today and your tests are completely normal, you will receive your results only by: Marland Kitchen MyChart Message (if you have MyChart) OR . A paper copy in the mail If you have any lab test that is abnormal or we need to change your treatment, we will call you to review the results.  Testing/Procedures: NONE  Follow-Up: At [facility], you and your health needs are our priority.  As part of our continuing mission to provide you with exceptional heart care, we have created designated Provider Care Teams.  These Care Teams include your primary Cardiologist (physician) and Advanced Practice Providers (APPs -  Physician Assistants and Nurse Practitioners) who all work together to provide you with the care you need, when you need it. You will need a follow up appointment in 6 months.  Please call our office 2 months in advance to schedule this appointment.  You may see Dr. Debara Pickett or one of the following Advanced Practice Providers on your designated Care Team: Almyra Deforest, [place] . Fabian Sharp, PA-C  Any Other Special Instructions Will Be Listed Below (If Applicable).

## 2018-09-05 ENCOUNTER — Telehealth: Payer: Self-pay | Admitting: Internal Medicine

## 2018-09-05 NOTE — Telephone Encounter (Signed)
Called patient, advised that medication list was updated and correct with the 100 mg.  No other questions.

## 2018-09-05 NOTE — Telephone Encounter (Signed)
  Patient wants to make sure that we have her Invokana listed on her med list as the 100 mg. She does not take the 300 mg.

## 2018-09-11 ENCOUNTER — Other Ambulatory Visit: Payer: Medicare Other

## 2018-09-16 ENCOUNTER — Ambulatory Visit: Payer: Medicare Other | Admitting: Endocrinology

## 2018-09-25 ENCOUNTER — Other Ambulatory Visit: Payer: Self-pay | Admitting: Endocrinology

## 2018-09-29 ENCOUNTER — Ambulatory Visit (INDEPENDENT_AMBULATORY_CARE_PROVIDER_SITE_OTHER): Payer: Medicare Other | Admitting: Podiatry

## 2018-09-29 ENCOUNTER — Telehealth: Payer: Self-pay | Admitting: *Deleted

## 2018-09-29 ENCOUNTER — Ambulatory Visit (INDEPENDENT_AMBULATORY_CARE_PROVIDER_SITE_OTHER): Payer: Medicare Other

## 2018-09-29 DIAGNOSIS — T148XXA Other injury of unspecified body region, initial encounter: Secondary | ICD-10-CM

## 2018-09-29 DIAGNOSIS — L03115 Cellulitis of right lower limb: Secondary | ICD-10-CM

## 2018-09-29 DIAGNOSIS — I739 Peripheral vascular disease, unspecified: Secondary | ICD-10-CM

## 2018-09-29 DIAGNOSIS — L97511 Non-pressure chronic ulcer of other part of right foot limited to breakdown of skin: Secondary | ICD-10-CM

## 2018-09-29 MED ORDER — MUPIROCIN 2 % EX OINT: 1.0000 "application " | TOPICAL_OINTMENT | Freq: Two times a day (BID) | CUTANEOUS | 2 refills | Status: DC

## 2018-09-29 MED ORDER — CEPHALEXIN 500 MG PO CAPS: 500.0000 mg | ORAL_CAPSULE | Freq: Three times a day (TID) | ORAL | 2 refills | Status: DC
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
## 2018-09-29 NOTE — Progress Notes (Signed)
Subjective: 78 year old female presents the office today for an acute appointment given wounds on her right foot.  She states that on Christmas she was wearing trousers socks and she developed blisters.  She states that the blisters popped on Thursday and she had some drainage on Friday but over the weekend she has noticed an increase in swelling or redness to the right foot.  She also has a blister on the left third toe.  She has no significant pain.  She has been trying to clean the area with peroxide as well as an bacitracin ointment and Betadine on the right side. Denies any systemic complaints such as fevers, chills, nausea, vomiting. No acute changes since last appointment, and no other complaints at this time.   Objective: AAO x3, NAD DP/PT pulses decreased bilaterally There is what appears to be old blisters present in the dorsal aspect the right second and third toes with superficial wounds present.  There is edema and faint erythema and increase in warmth of the right forefoot.  There is no drainage or pus identified today.  There is old pressure type sores to the distal aspect of the toes on the right foot which appear to be old flat hemorrhagic blisters.  There is no drainage or pus.  On the left dorsal third toe is a large blister I did drain this today and only clear blister fluid was expressed.  There is no purulence. No open lesions or pre-ulcerative lesions.  No pain with calf compression, swelling, warmth, erythema        Assessment: Ulcerations right foot with blisters and cellulitis right foot  Plan: -All treatment options discussed with the patient including all alternatives, risks, complications.  -X-rays were obtained and reviewed.  No definitive cortical destruction suggestive of osteomyelitis at this time and no soft tissue emphysema is present. -On the left foot I did clean the blister with alcohol use an 18-gauge needle just to drain the area but I did not remove the  blister.  I want her to keep the area clean with a small amount of Betadine. -On the right foot I want to use mupirocin ointment dressing changes daily which I prescribed.  Also prescribed Keflex.  She has a surgical shoe at home and I want her to wear this at all times to offload. -Due to decreased pulses order arterial studies to evaluate circulation with the wound present. -Monitor for any clinical signs or symptoms of infection and directed to call the office immediately should any occur or go to the ER.  RTC 1 week or sooner if needed  Trula Slade DPM

## 2018-09-29 NOTE — Telephone Encounter (Signed)
Faxed orders to [facility]. 

## 2018-10-02 ENCOUNTER — Other Ambulatory Visit: Payer: Self-pay | Admitting: *Deleted

## 2018-10-02 MED ORDER — HYDROCHLOROTHIAZIDE 25 MG PO TABS: 25.0000 mg | ORAL_TABLET | Freq: Every day | ORAL | 1 refills | Status: DC

## 2018-10-06 ENCOUNTER — Ambulatory Visit (INDEPENDENT_AMBULATORY_CARE_PROVIDER_SITE_OTHER): Payer: Medicare Other | Admitting: Podiatry

## 2018-10-06 ENCOUNTER — Encounter (HOSPITAL_COMMUNITY): Payer: Medicare Other

## 2018-10-06 DIAGNOSIS — L03115 Cellulitis of right lower limb: Secondary | ICD-10-CM

## 2018-10-06 DIAGNOSIS — I739 Peripheral vascular disease, unspecified: Secondary | ICD-10-CM

## 2018-10-06 DIAGNOSIS — L97511 Non-pressure chronic ulcer of other part of right foot limited to breakdown of skin: Secondary | ICD-10-CM

## 2018-10-07 ENCOUNTER — Other Ambulatory Visit: Payer: Medicare Other

## 2018-10-09 ENCOUNTER — Ambulatory Visit (HOSPITAL_COMMUNITY)
Admission: RE | Admit: 2018-10-09 | Discharge: 2018-10-09 | Disposition: A | Discharge status: Home / Self Care | Payer: Medicare Other | Source: Ambulatory Visit | Attending: Cardiology | Admitting: Cardiology

## 2018-10-09 DIAGNOSIS — L03115 Cellulitis of right lower limb: Secondary | ICD-10-CM | POA: Diagnosis not present

## 2018-10-09 DIAGNOSIS — L97511 Non-pressure chronic ulcer of other part of right foot limited to breakdown of skin: Secondary | ICD-10-CM

## 2018-10-09 DIAGNOSIS — I739 Peripheral vascular disease, unspecified: Secondary | ICD-10-CM

## 2018-10-10 ENCOUNTER — Ambulatory Visit: Payer: Medicare Other | Admitting: Endocrinology

## 2018-10-12 NOTE — Progress Notes (Signed)
Subjective: 78 year old female presents the office today for a follow-up appointment given wounds on her right foot.  The left foot she also has a area that she is been keeping Betadine on it she can keep an antibiotic ointment on the right side.  She denies any drainage or pus coming from the area and they have gotten somewhat better.  She still on the Keflex.  She is tried wearing open sugar shoe to avoid the pressure.  She is scheduled for arterial studies this week.  Denies any systemic complaints such as fevers, chills, nausea, vomiting. No acute changes since last appointment, and no other complaints at this time.   Objective: AAO x3, NAD Pulses decreased On the right foot there is a wound still on the right second dorsal PIPJ as well as at the base of the third toe there is erythema to the lesser digits.  No ascending cellulitis.  There is no fluctuation crepitation or any malodor.  On the left dorsal third toe is a pre-ulcerative area as well this is where she had the blister previously there is localized erythema to this area.  No ascending cellulitis there is no fluctuation crepitation. No pain with calf compression the month swelling, warmth, erythema.      Assessment: Ulcerations right foot with blisters with localized cellulitis  Plan: -All treatment options discussed with the patient including all alternatives, risks, complications.  -We will switch to use Betadine on the wounds daily.  Discussed changing the bandage twice a day.  Continue with Keflex.  Concerned that her circulation she has not been able to get vascular studies performed this week.  Encouraged offloading at all times.  Monitoring signs or symptoms of worsening infection with the ER should any occur.  Return in about 1 week (around 10/13/2018).  Trula Slade DPM

## 2018-10-13 ENCOUNTER — Ambulatory Visit (INDEPENDENT_AMBULATORY_CARE_PROVIDER_SITE_OTHER): Payer: Medicare Other | Admitting: Podiatry

## 2018-10-13 VITALS — Temp 97.7°F

## 2018-10-13 DIAGNOSIS — L03115 Cellulitis of right lower limb: Secondary | ICD-10-CM

## 2018-10-13 DIAGNOSIS — I739 Peripheral vascular disease, unspecified: Secondary | ICD-10-CM | POA: Diagnosis not present

## 2018-10-13 DIAGNOSIS — I6523 Occlusion and stenosis of bilateral carotid arteries: Secondary | ICD-10-CM

## 2018-10-13 DIAGNOSIS — L97511 Non-pressure chronic ulcer of other part of right foot limited to breakdown of skin: Secondary | ICD-10-CM | POA: Diagnosis not present

## 2018-10-13 MED ORDER — CEPHALEXIN 500 MG PO CAPS: 500.0000 mg | ORAL_CAPSULE | Freq: Three times a day (TID) | ORAL | 2 refills | Status: DC

## 2018-10-14 ENCOUNTER — Telehealth: Payer: Self-pay | Admitting: *Deleted

## 2018-10-14 ENCOUNTER — Encounter: Payer: Self-pay | Admitting: Cardiovascular Disease

## 2018-10-14 ENCOUNTER — Ambulatory Visit (INDEPENDENT_AMBULATORY_CARE_PROVIDER_SITE_OTHER): Payer: Medicare Other | Admitting: Cardiovascular Disease

## 2018-10-14 DIAGNOSIS — I739 Peripheral vascular disease, unspecified: Secondary | ICD-10-CM

## 2018-10-14 DIAGNOSIS — I10 Essential (primary) hypertension: Secondary | ICD-10-CM

## 2018-10-14 DIAGNOSIS — E785 Hyperlipidemia, unspecified: Secondary | ICD-10-CM | POA: Diagnosis not present

## 2018-10-14 DIAGNOSIS — I6523 Occlusion and stenosis of bilateral carotid arteries: Secondary | ICD-10-CM

## 2018-10-14 NOTE — Assessment & Plan Note (Signed)
History of essential hypertension with blood pressure measured today at 146/52.  She is on diltiazem, Lopressor, Micardis and HydroDIURIL.

## 2018-10-14 NOTE — Telephone Encounter (Signed)
Pt called for results. I informed pt of Dr. Leigh Aurora review of results and stressed the importance of see Dr.Berry. pt states understanding and will see Dr. Gwenlyn Found today.

## 2018-10-14 NOTE — Progress Notes (Signed)
10/14/2018 [name]   October 06, 1940  [id number]  Primary Physician Maurice Small, MD Primary Cardiologist: [name] MD [name], [place]  HPI:  [name] is a 78 y.o. mildly overweight widowed Caucasian female mother of 33, grandmother 7 grandchildren referred by Dr. Jacqualyn Posey, her podiatrist for critical limb ischemia.  She does have a history of treated hypertension, diabetes and hyperlipidemia.  She is never had a heart attack or stroke.  She denies chest pain or shortness of breath.  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  She has some local trauma related to socks over Christmas and has developed ischemic appearing wounds on her right second and third toes with recent Dopplers performed 10/09/2018 revealing a right ABI 0.75, left 2.79 with moderately severe right SFA stenosis and an occluded right posterior tibial artery.  She is seeing Dr. Jacqualyn Posey back regularly for aggressive local wound care.   Current Meds  Medication Sig  . Ascorbic Acid (VITAMIN C) 1000 MG tablet Take 1,000 mg by mouth daily.  Marland Kitchen aspirin 81 MG tablet Take 81 mg by mouth daily.  Marland Kitchen atorvastatin (LIPITOR) 20 MG tablet Take 1 tablet (20 mg total) by mouth daily.  . cephALEXin (KEFLEX) 500 MG capsule Take 1 capsule (500 mg total) by mouth 3 (three) times daily.  Marland Kitchen CINNAMON PO Take 1,000 mg by mouth 2 (two) times daily.   Marland Kitchen diltiazem (CARTIA XT) 240 MG 24 hr capsule Take 1 capsule (240 mg total) by mouth daily.  . fish oil-omega-3 fatty acids 1000 MG capsule Take 1 g by mouth daily. TAKE 1 TABLET BY MOUTH 4 TIMES WEEKLY.  Marland Kitchen glipiZIDE (GLUCOTROL XL) 5 MG 24 hr tablet TAKE 1 TABLET BY MOUTH ONCE DAILY WITH BREAKFAST  . hydrochlorothiazide (HYDRODIURIL) 25 MG tablet Take 1 tablet (25 mg total) by mouth daily.  Marland Kitchen JANUVIA 100 MG tablet TAKE 1 TABLET BY MOUTH ONCE DAILY  . Magnesium 500 MG TABS Take 0.5 tablets by mouth daily.   . metFORMIN (GLUCOPHAGE) 1000 MG tablet Take 1 tablet (1,000 mg total) by  mouth 2 (two) times daily with a meal.  . metoprolol (LOPRESSOR) 100 MG tablet Take half tab in the morning and a whole tab in the evening. (Patient taking differently: Take 100 mg by mouth 2 (two) times daily. TAKE 1 TABLET TWICE A DAY)  . omeprazole (PRILOSEC) 20 MG capsule Take 20 mg by mouth 2 (two) times a week.   . telmisartan (MICARDIS) 80 MG tablet Take 1 tablet (80 mg total) by mouth daily.  Marland Kitchen terbinafine (LAMISIL) 250 MG tablet Take 250 mg by mouth daily.  . vitamin B-12 (CYANOCOBALAMIN) 250 MCG tablet Take 1,000 mcg by mouth daily.  . Vitamin D-Vitamin K (VITAMIN K2-VITAMIN D3 PO) Take by mouth. Take 1 tablet by mouth 3 times weekly  . [DISCONTINUED] Biotin (BIOTIN 5000) 5 MG CAPS Take by mouth. 5 times weekly  . [DISCONTINUED] canagliflozin (INVOKANA) 300 MG TABS tablet Take 1 tablet (300 mg total) by mouth daily before breakfast.  . [DISCONTINUED] INVOKANA 100 MG TABS tablet TAKE 1 TABLET BY MOUTH BEFORE BREAKFAST  . [DISCONTINUED] mupirocin ointment (BACTROBAN) 2 % Apply 1 application topically 2 (two) times daily.  . [DISCONTINUED] PROBIOTIC CAPS Take by mouth as needed.     Allergies  Allergen Reactions  . Demerol [Meperidine]   . Scopolamine     Social History   Socioeconomic History  . Marital status: Widowed    Spouse name: Not  on file  . Number of children: Not on file  . Years of education: Not on file  . Highest education level: Not on file  Occupational History  . Not on file  Social Needs  . Financial resource strain: Not on file  . Food insecurity:    Worry: Not on file    Inability: Not on file  . Transportation needs:    Medical: Not on file    Non-medical: Not on file  Tobacco Use  . Smoking status: Former Smoker    Last attempt to quit: 03/31/1972    Years since quitting: 46.5  . Smokeless tobacco: Never Used  Substance and Sexual Activity  . Alcohol use: No  . Drug use: No  . Sexual activity: Not on file  Lifestyle  . Physical activity:     Days per week: Not on file    Minutes per session: Not on file  . Stress: Not on file  Relationships  . Social connections:    Talks on phone: Not on file    Gets together: Not on file    Attends religious service: Not on file    Active member of club or organization: Not on file    Attends meetings of clubs or organizations: Not on file    Relationship status: Not on file  . Intimate partner violence:    Fear of current or ex partner: Not on file    Emotionally abused: Not on file    Physically abused: Not on file    Forced sexual activity: Not on file  Other Topics Concern  . Not on file  Social History Narrative  . Not on file     Review of Systems: General: negative for chills, fever, night sweats or weight changes.  Cardiovascular: negative for chest pain, dyspnea on exertion, edema, orthopnea, palpitations, paroxysmal nocturnal dyspnea or shortness of breath Dermatological: negative for rash Respiratory: negative for cough or wheezing Urologic: negative for hematuria Abdominal: negative for nausea, vomiting, diarrhea, bright red blood per rectum, melena, or hematemesis Neurologic: negative for visual changes, syncope, or dizziness All other systems reviewed and are otherwise negative except as noted above.    Blood pressure (!) 146/52, pulse 62, height 5\' 4"  (1.626 m), weight 155 lb 6.4 oz (70.5 kg).  General appearance: alert and no distress Neck: no adenopathy, no JVD, supple, symmetrical, trachea midline, thyroid not enlarged, symmetric, no tenderness/mass/nodules and Bilateral carotid bruits Lungs: clear to auscultation bilaterally Heart: regular rate and rhythm, S1, S2 normal, no murmur, click, rub or gallop Extremities: extremities normal, atraumatic, no cyanosis or edema Pulses: Absent pedal pulses Skin: Ischemic appearing wounds right second and third toes Neurologic: Alert and oriented X 3, normal strength and tone. Normal symmetric reflexes. Normal  coordination and gait  EKG not performed today  ASSESSMENT AND PLAN:   Essential hypertension History of essential hypertension with blood pressure measured today at 146/52.  She is on diltiazem, Lopressor, Micardis and HydroDIURIL.  Dyslipidemia History of dyslipidemia on statin therapy lipid profile performed 05/22/2018 revealing total cholesterol 147, LDL 69 and HDL 33  Carotid artery disease History of carotid artery disease status post carotid Dopplers performed 07/29/2018 revealing moderate right ICA stenosis which we will follow on annual basis.  PAD (peripheral artery disease) History of critical limb ischemia with his ischemic ulcers on the right second and third toes as result of local trauma Christmas of last year.  She is diabetic and has peripheral neuropathy.  She denies claudication.  Lower extremity arterial Doppler studies performed in our office 10/09/2018 revealed a right ABI of 0.75 and a left 2.79.  She did have high-frequency signals in her mid right SFA which is calcific as well as an occluded posterior tibial.  She has an occluded left popliteal artery.  At this point, I recommend aggressive local wound care.  I will follow-up on a monthly basis.  Should her wounds progress she would be a candidate for angiography potential endovascular therapy for limb salvage.      [name] MD FACP,FACC,FAHA, [facility] 10/14/2018 5:04 PM

## 2018-10-14 NOTE — Assessment & Plan Note (Signed)
History of dyslipidemia on statin therapy lipid profile performed 05/22/2018 revealing total cholesterol 147, LDL 69 and HDL 33

## 2018-10-14 NOTE — Assessment & Plan Note (Signed)
History of critical limb ischemia with his ischemic ulcers on the right second and third toes as result of local trauma Christmas of last year.  She is diabetic and has peripheral neuropathy.  She denies claudication.  Lower extremity arterial Doppler studies performed in our office 10/09/2018 revealed a right ABI of 0.75 and a left 2.79.  She did have high-frequency signals in her mid right SFA which is calcific as well as an occluded posterior tibial.  She has an occluded left popliteal artery.  At this point, I recommend aggressive local wound care.  I will follow-up on a monthly basis.  Should her wounds progress she would be a candidate for angiography potential endovascular therapy for limb salvage.

## 2018-10-14 NOTE — Assessment & Plan Note (Signed)
History of carotid artery disease status post carotid Dopplers performed 07/29/2018 revealing moderate right ICA stenosis which we will follow on annual basis.

## 2018-10-14 NOTE — Telephone Encounter (Signed)
-----   Message from Trula Slade, DPM sent at 10/10/2018  7:24 AM EST ----- Val- please let her know that there are some blockages in her leg and decreased circulation. They scheduled an appointment to see Dr. Gwenlyn Found it looks like. Please let her know that I saw the tests and see the reduced circulation and it is important she follow-up with Dr. Gwenlyn Found. Thanks.

## 2018-10-14 NOTE — Telephone Encounter (Signed)
Left message requesting to call for results.

## 2018-10-14 NOTE — Patient Instructions (Signed)
Medication Instructions:  NONE If you need a refill on your cardiac medications before your next appointment, please call your pharmacy.   Lab work: NONE If you have labs (blood work) drawn today and your tests are completely normal, you will receive your results only by: Marland Kitchen MyChart Message (if you have MyChart) OR . A paper copy in the mail If you have any lab test that is abnormal or we need to change your treatment, we will call you to review the results.  Testing/Procedures: NONE  Follow-Up: At [facility], you and your health needs are our priority.  As part of our continuing mission to provide you with exceptional heart care, we have created designated Provider Care Teams.  These Care Teams include your primary Cardiologist (physician) and Advanced Practice Providers (APPs -  Physician Assistants and Nurse Practitioners) who all work together to provide you with the care you need, when you need it. . You will need a follow up appointment in 1 MONTH. You may see Dr. Gwenlyn Found or one of the following Advanced Practice Providers on your designated Care Team:   . Kerin Ransom, [place] . Almyra Deforest, PA-C . Fabian Sharp, PA-C . Jory Sims, DNP . Rosaria Ferries, PA-C . Roby Lofts, PA-C . Sande Rives, PA-C

## 2018-10-15 ENCOUNTER — Telehealth: Payer: Self-pay | Admitting: *Deleted

## 2018-10-15 NOTE — Telephone Encounter (Signed)
Left message for patient to call and schedule 1 month appointment

## 2018-10-20 NOTE — Progress Notes (Signed)
Subjective: 78 year old female presents the office today for a follow-up appointment given wounds on her right foot and left foot.  She states that overall they are getting better.  She is continue with a open toed shoe.  She continues to change the bandages daily she denies any drainage or pus coming from the area.  She has been keeping Betadine on the areas.  She states the redness is also improved and she denies any red streaks.  She has no other concerns today.  No pain.  She denies any fevers, chills, nausea, vomiting.  No calf pain, chest pain, shortness of breath.   Objective: AAO x3, NAD Pulses decreased On the right foot there is a fibrotic wound still on the right second dorsal PIPJ as well as at the base of the third toe there is resolving erythema to the lesser digits.  No ascending cellulitis.  There is no fluctuation crepitation or any malodor.  On the left dorsal third toe is a pre-ulcerative area as well this is where she had the blister previously there is localized erythema to this area.  There is no probing, undermining or tunneling to the wounds.  No fluctuation or crepitation or malodor.  No ascending cellulitis there is no fluctuation crepitation. No pain with calf compression the month swelling, warmth, erythema  Assessment: Ulcerations right foot with blisters with improving cellulitis  Plan: -All treatment options discussed with the patient including all alternatives, risks, complications.  -She is to continue with Betadine dressing changes to the area daily.  Continue offloading to the areas.  Running continue cephalexin for now.  This was refilled today.  Monitoring signs or symptoms of worsening infection to the ER immediately should any occur.  Trula Slade DPM

## 2018-10-21 ENCOUNTER — Other Ambulatory Visit (INDEPENDENT_AMBULATORY_CARE_PROVIDER_SITE_OTHER): Payer: Medicare Other

## 2018-10-21 DIAGNOSIS — E1165 Type 2 diabetes mellitus with hyperglycemia: Secondary | ICD-10-CM

## 2018-10-21 LAB — BASIC METABOLIC PANEL
BUN: 22 mg/dL (ref 6–23)
CO2: 30 mEq/L (ref 19–32)
Calcium: 10.3 mg/dL (ref 8.4–10.5)
Chloride: 104 mEq/L (ref 96–112)
Creatinine, Ser: 0.88 mg/dL (ref 0.40–1.20)
GFR: 62.37 mL/min (ref >60.00)
Glucose, Bld: 170 mg/dL — ABNORMAL HIGH (ref 70–99)
Potassium: 5 mEq/L (ref 3.5–5.1)
Sodium: 140 mEq/L (ref 135–145)

## 2018-10-22 LAB — FRUCTOSAMINE: Fructosamine: 268 umol/L (ref 0–285)

## 2018-10-23 ENCOUNTER — Ambulatory Visit (INDEPENDENT_AMBULATORY_CARE_PROVIDER_SITE_OTHER): Payer: Medicare Other | Admitting: Endocrinology

## 2018-10-23 ENCOUNTER — Encounter: Payer: Self-pay | Admitting: Endocrinology

## 2018-10-23 VITALS — BP 118/64 | HR 55 | Ht 64.0 in | Wt 155.4 lb

## 2018-10-23 DIAGNOSIS — I10 Essential (primary) hypertension: Secondary | ICD-10-CM | POA: Diagnosis not present

## 2018-10-23 DIAGNOSIS — I6523 Occlusion and stenosis of bilateral carotid arteries: Secondary | ICD-10-CM | POA: Diagnosis not present

## 2018-10-23 DIAGNOSIS — E1165 Type 2 diabetes mellitus with hyperglycemia: Secondary | ICD-10-CM | POA: Diagnosis not present

## 2018-10-23 LAB — POCT GLYCOSYLATED HEMOGLOBIN (HGB A1C): Hemoglobin A1C: 7.7 % — AB (ref 4.0–5.6)

## 2018-10-23 MED ORDER — SEMAGLUTIDE(0.25 OR 0.5MG/DOS) 2 MG/1.5ML ~~LOC~~ SOPN: 0.5000 mg | PEN_INJECTOR | SUBCUTANEOUS | 2 refills | Status: DC

## 2018-10-23 NOTE — Patient Instructions (Addendum)
Check blood sugars on waking up  3 days a week  Also check blood sugars about 2 hours after meals and do this after different meals by rotation  Recommended blood sugar levels on waking up are 90-130 and about 2 hours after meal is 130-160  Please bring your blood sugar monitor to each visit, thank you  Start OZEMPIC injections by dialing 0.25 mg on the pen as shown once weekly on the same day of the week.   You may inject in the sides of the stomach, outer thigh or arm as indicated in the brochure given. If you have any difficulties using the pen see the video at CompPlans.[email or web address].za  You will feel fullness of the stomach with starting the medication and should try to keep the portions at meals small.  You may experience nausea in the first few days which usually gets better over time    After 4 weeks increase the dose to 0.5 mg weekly  If you have any questions or persistent side effects please call the office   You may also talk to a nurse educator with [employer] at [phone] Useful website: [email or web address]   Move Losartan to pm  Stop Januvia

## 2018-10-23 NOTE — Progress Notes (Signed)
Patient ID: [name], female   DOB: March 05, 1941, 78 y.o.   MRN: [id number]          Reason for Appointment: Follow-up for endocrinology problems  Referring physician: Darcus Austin   History of Present Illness:          Date of diagnosis of type 2 diabetes mellitus: 1984       Background history:   She has been on metformin since about the time of her diagnosis and has been on other drugs also but has not had many changes in her regimen in the last several years She thinks her blood sugar used to be fairly well controlled At some point she was given Byetta for about a year and a half which improved her control but this was subsequently start because of it being too expensive for her.  She had no side effects with this  Recent history:   Her A1c is 7.7 although fructosamine is 268  Previously A1c was 7.4  Non-insulin hypoglycemic drugs: Glipizide ER 5 mg daily, Invokana 300 mg daily, Januvia 100 mg daily, metformin 1 g twice daily  Current management, blood sugar patterns and problems identified:  Since her blood sugars were higher after meals on her last visit her Invokana was increased up to 300 mg  She thinks she is taking this and has no side effects from this  However overall her blood sugars at home look higher than on her last visit  This is partly from her not doing much walking because of blisters on her feet  She thinks she is taking good care of her diet with cutting back on carbohydrates and avoiding sweets or sweet drinks  Her weight is about 3 pounds more than last month  Currently she is checking blood sugars mostly FASTING and these are usually over 140 except twice, previously averaging 134  Also she has only a couple of readings AFTER lunch and these are over 200  She has previously taken Byetta and had no nausea with this and is familiar with insulin injections        Side effects from medications have been: None  Compliance with the medical  regimen: Good Hypoglycemia:   None  Glucose monitoring:  done only 1 times a day         Glucometer: One Touch.       Blood Glucose readings by time of day and averages from meter download:   PRE-MEAL Fasting Lunch Dinner Bedtime Overall  Glucose range:  128-185      Mean/median:  167     170   POST-MEAL PC Breakfast PC Lunch PC Dinner  Glucose range:  167-242  210, 271   Mean/median:      PREVIOUS readings:   PRE-MEAL Fasting Lunch Dinner Bedtime Overall  Glucose range:  121-176      Mean/median: 134    137   POST-MEAL PC Breakfast PC Lunch PC Dinner  Glucose range:   163  242  Mean/median:         Self-care: The diet that the patient has been following is: tries to limit sweets and fried food.     Meal times are:  Breakfast is at 8:30 AM lunch: 1 PM, dinner: 6:30 PM  Typical meal intake: Breakfast is oatmeal with fruit Lunch is usually soup with crackers, dinner is usually a low-fat meat with vegetables Snacks will be celery and ranch dressing  Dietician visit, most recent: 10/18               Exercise: Walking 0-15 minutes, 2-3 days a week    Weight history:  Wt Readings from Last 3 Encounters:  10/23/18 155 lb 6.4 oz (70.5 kg)  10/14/18 155 lb 6.4 oz (70.5 kg)  09/04/18 152 lb 12.8 oz (69.3 kg)    Glycemic control:   Lab Results  Component Value Date   HGBA1C 7.7 (A) 10/23/2018   HGBA1C 7.4 (H) 07/25/2018   HGBA1C 7.7 (A) 05/01/2018   Lab Results  Component Value Date   LDLCALC 87 04/06/2013   CREATININE 0.88 10/21/2018   No results found for: [facility]  Lab Results  Component Value Date   FRUCTOSAMINE 268 10/21/2018   FRUCTOSAMINE 267 05/30/2018      Allergies as of 10/23/2018      Reactions   Demerol [meperidine]    Scopolamine       Medication List       Accurate as of October 23, 2018  1:31 PM. Always use your most recent med list.        aspirin 81 MG tablet Take 81 mg by mouth daily.   atorvastatin 20 MG  tablet Commonly known as:  LIPITOR Take 1 tablet (20 mg total) by mouth daily.   cephALEXin 500 MG capsule Commonly known as:  KEFLEX Take 1 capsule (500 mg total) by mouth 3 (three) times daily.   CINNAMON PO Take 1,000 mg by mouth 2 (two) times daily.   diltiazem 240 MG 24 hr capsule Commonly known as:  CARTIA XT Take 1 capsule (240 mg total) by mouth daily.   fish oil-omega-3 fatty acids 1000 MG capsule Take 1 g by mouth daily. TAKE 1 TABLET BY MOUTH 4 TIMES WEEKLY.   glipiZIDE 5 MG 24 hr tablet Commonly known as:  GLUCOTROL XL TAKE 1 TABLET BY MOUTH ONCE DAILY WITH BREAKFAST   hydrochlorothiazide 25 MG tablet Commonly known as:  HYDRODIURIL Take 1 tablet (25 mg total) by mouth daily.   JANUVIA 100 MG tablet Generic drug:  sitaGLIPtin TAKE 1 TABLET BY MOUTH ONCE DAILY   Magnesium 500 MG Tabs Take 0.5 tablets by mouth daily.   metFORMIN 1000 MG tablet Commonly known as:  GLUCOPHAGE Take 1 tablet (1,000 mg total) by mouth 2 (two) times daily with a meal.   metoprolol tartrate 100 MG tablet Commonly known as:  LOPRESSOR Take half tab in the morning and a whole tab in the evening.   omeprazole 20 MG capsule Commonly known as:  PRILOSEC Take 20 mg by mouth 2 (two) times a week.   telmisartan 80 MG tablet Commonly known as:  MICARDIS Take 1 tablet (80 mg total) by mouth daily.   terbinafine 250 MG tablet Commonly known as:  LAMISIL Take 250 mg by mouth daily.   vitamin B-12 250 MCG tablet Commonly known as:  CYANOCOBALAMIN Take 1,000 mcg by mouth daily.   vitamin C 1000 MG tablet Take 1,000 mg by mouth daily.   VITAMIN K2-VITAMIN D3 PO Take by mouth. Take 1 tablet by mouth 3 times weekly       Allergies:  Allergies  Allergen Reactions  . Demerol [Meperidine]   . Scopolamine     Past Medical History:  Diagnosis Date  . Diabetes mellitus without complication ([place])   . Hyperlipidemia   . Hypertension 11/20/11   ECHO- EF>55% Borderline concentric  left ventricular hypertrophy. There is a small calcified mass  in the L:A near the LA appendage. No valvular masses seen with associated mitral annular calcification. LA Volume/ BSA27.4 ml/m2 No AS. Right ventricular systolic pressure is elevated at 42mmHg.    Past Surgical History:  Procedure Laterality Date  . ABDOMINAL HYSTERECTOMY      Family History  Problem Relation Age of Onset  . Cancer - Prostate Father   . Cancer - Colon Father   . Stroke Mother   . Hypertension Mother   . Hyperlipidemia Mother   . Melanoma Brother     Social History:  reports that she quit smoking about 46 years ago. She has never used smokeless tobacco. She reports that she does not drink alcohol or use drugs.   Review of Systems   Lipid history: Hypercholesterolemia by PCP with 20 mg Lipitor    Lab Results  Component Value Date   CHOL 164 04/06/2013   HDL 40 04/06/2013   LDLCALC 87 04/06/2013   TRIG 187 (H) 04/06/2013           Hypertension: Present for several years, currently treated with  losartan 100 mg, taking this in the morning   She think blood pressure is higher at home also at home upto 160 in the morning HCTZ was started back on her last visit  BP Readings from Last 3 Encounters:  10/23/18 118/64  10/14/18 (!) 146/52  09/04/18 (!) 142/64    Most recent eye exam was in 9/19  Most recent foot exam: 04/2018    LABS:  Office Visit on 10/23/2018  Component Date Value Ref Range Status  . Hemoglobin A1C 10/23/2018 7.7* 4.0 - 5.6 % Final  Lab on 10/21/2018  Component Date Value Ref Range Status  . Fructosamine 10/21/2018 268  0 - 285 umol/L Final   Comment: Published reference interval for apparently healthy subjects between age 70 and 36 is 78 - 285 umol/L and in a poorly controlled diabetic population is 228 - 563 umol/L with a mean of 396 umol/L.   Marland Kitchen Sodium 10/21/2018 140  135 - 145 mEq/L Final  . Potassium 10/21/2018 5.0  3.5 - 5.1 mEq/L Final  . Chloride  10/21/2018 104  96 - 112 mEq/L Final  . CO2 10/21/2018 30  19 - 32 mEq/L Final  . Glucose, Bld 10/21/2018 170* 70 - 99 mg/dL Final  . BUN 10/21/2018 22  6 - 23 mg/dL Final  . Creatinine, Ser 10/21/2018 0.88  0.40 - 1.20 mg/dL Final  . Calcium 10/21/2018 10.3  8.4 - 10.5 mg/dL Final  . GFR 10/21/2018 62.37  >60.00 mL/min Final    Physical Examination:  BP 118/64 (BP Location: Left Arm, Patient Position: Sitting, Cuff Size: Normal)   Pulse (!) 55   Ht 5\' 4"  (1.626 m)   Wt 155 lb 6.4 oz (70.5 kg)   SpO2 95%   BMI 26.67 kg/m       ASSESSMENT:  Diabetes type 2, uncontrolled  See history of present illness for detailed discussion of current diabetes management, blood sugar patterns and problems identified  Her A1c is 7.7 and seems to be going up  Also her blood sugars at home are relatively higher and frequently high after meals when she checks them which is not regular Fasting readings are as high as 185 She likely has progression of her diabetes and some insulin deficiency Has not responded to increasing her Invokana on the last visit Appears to be doing fairly well with her diet However her BMI is under 27  indicating only minimal obesity  However may respond to GLP-1 drugs which she had previously taken using Byetta  Hypertension: Well controlled usually, she thinks that her blood pressure is higher in the mornings   PLAN:    She will need to start checking blood sugars more consistently especially after meals she will need to bring her monitor on each visit Discussed with the patient the nature of GLP-1 drugs, the action on various organ systems, improved satiety, how they benefit blood glucose control, as well as the benefit of weight loss. Explained possible side effects of TRULICITY, most commonly nausea that usually improves over time; discussed safety information in package insert.  Demonstrated the medication injection device and injection technique to the patient.   Showed patient possible injection sites To start with 0.25 mg dosage weekly for the first 4 injections and then go up to the 1.5 dose Patient information given on Ozempic She will stop Januvia Encourage her to be as active as possible  Will check fructosamine and see her in follow-up in 6 weeks  Increase Invokana to 2 tablets now and then switch to 300 mg daily  Discussed use of a weekly GLP-1 injection and benefits if sugars are not consistently controlled  She needs to start improving her diet  More frequent exercise  For her HYPERTENSION she will try using her losartan in the evening instead of morning and continue HCTZ.  She will clarify with her cardiologist if she needs to switch to Cardizem which is listed  Continue Invokana unchanged  Counseling time on subjects discussed in assessment and plan sections is over 50% of today's 25 minute visit   There are no Patient Instructions on file for this visit.     Elayne Snare 10/23/2018, 1:31 PM   Note: This office note was prepared with Dragon voice recognition system technology. Any transcriptional errors that result from this process are unintentional.

## 2018-10-24 ENCOUNTER — Other Ambulatory Visit: Payer: Self-pay | Admitting: Endocrinology

## 2018-10-24 ENCOUNTER — Encounter: Payer: Self-pay | Admitting: Podiatry

## 2018-10-24 ENCOUNTER — Ambulatory Visit (INDEPENDENT_AMBULATORY_CARE_PROVIDER_SITE_OTHER): Payer: Medicare Other | Admitting: Podiatry

## 2018-10-24 DIAGNOSIS — L97511 Non-pressure chronic ulcer of other part of right foot limited to breakdown of skin: Secondary | ICD-10-CM

## 2018-10-24 DIAGNOSIS — I739 Peripheral vascular disease, unspecified: Secondary | ICD-10-CM

## 2018-10-24 DIAGNOSIS — L03115 Cellulitis of right lower limb: Secondary | ICD-10-CM | POA: Diagnosis not present

## 2018-10-24 DIAGNOSIS — I6523 Occlusion and stenosis of bilateral carotid arteries: Secondary | ICD-10-CM

## 2018-11-03 NOTE — Progress Notes (Signed)
Subjective: 78 year old female presents the office today for a follow-up appointment given wounds on her right foot and left foot.  Overall she states that she is doing much better.  She denies any drainage or pus or any redness or red streaks.  She has continued to put a little bit of Betadine on the wounds but she is been keeping it open at nighttime to allow it to dry.  She states this is been helping quite a bit.  She has been doing open toed shoes to avoid pressure. No pain.  She denies any fevers, chills, nausea, vomiting.  No calf pain, chest pain, shortness of breath.   Objective: AAO x3, NAD Pulses decreased On the right side there is still a fibrotic area present to the base of the left third toe as well as the dorsal second PIPJ and the left second PIPJ as well.  There is no probing, undermining or tunneling.  Overall is much improved erythema there is no ascending cellulitis.  No fluctuation or crepitation malodor. No pain with calf compression the month swelling, warmth, erythema  Assessment: Ulcerations right foot with blisters with improving cellulitis  Plan: -All treatment options discussed with the patient including all alternatives, risks, complications.  -She is to continue with Betadine dressing changes to the area daily.  Continue offloading to the areas.  She does not there is a course of antibiotics would hold off any further antibiotics when she finishes this.  She is doing much better.  Continue open toed shoe to avoid pressure  -Monitor for any clinical signs or symptoms of infection and directed to call the office immediately should any occur or go to the ER.  RTC 2 weeks or sooner if needed.  Trula Slade DPM

## 2018-11-07 ENCOUNTER — Ambulatory Visit (INDEPENDENT_AMBULATORY_CARE_PROVIDER_SITE_OTHER): Payer: Medicare Other | Admitting: Podiatry

## 2018-11-07 DIAGNOSIS — I739 Peripheral vascular disease, unspecified: Secondary | ICD-10-CM

## 2018-11-07 DIAGNOSIS — I6523 Occlusion and stenosis of bilateral carotid arteries: Secondary | ICD-10-CM

## 2018-11-07 DIAGNOSIS — L97511 Non-pressure chronic ulcer of other part of right foot limited to breakdown of skin: Secondary | ICD-10-CM

## 2018-11-12 NOTE — Progress Notes (Signed)
Subjective: 78 year old female presents the office today for a follow-up appointment given wounds on her right foot and left foot.  She feels that overall the wounds have been getting better.  She been keeping Betadine on them daily.  She is been wearing open toed shoes to avoid the pressure.  She denies any drainage or pus or any increase in swelling or redness or any red streaks.  She denies any fevers, chills, nausea, vomiting.  No calf pain, chest pain, shortness of breath.  Objective: AAO x3, NAD Pulses decreased On the right side there is still a fibrotic area present to the base of the left third toe as well as the dorsal second PIPJ and the left second PIPJ as well.  I did debride some of the loose tissue on the periphery and appears to be new, healthy skin present.  The wounds appear to be almost healed but they do remain.  There is no probing to bone, undermining or tunneling.  There is no surrounding erythema, ascending cellulitis.  No fluctuation or crepitation or any malodor.  No pain with calf compression the month swelling, warmth, erythema  Assessment: Ulcerations right foot with blisters without signs of infection  Plan: -All treatment options discussed with the patient including all alternatives, risks, complications.  -Overall the wounds are healing, although slowly.  I debrided some of the loose tissue today.  She is to continue with Betadine dressing changes to the area daily.  Continue offloading to the areas.  We will hold off any further antibiotics as there is no signs of infection. -Monitor for any clinical signs or symptoms of infection and directed to call the office immediately should any occur or go to the ER.  RTC 2 weeks or sooner if needed.  Trula Slade DPM

## 2018-11-19 ENCOUNTER — Ambulatory Visit (INDEPENDENT_AMBULATORY_CARE_PROVIDER_SITE_OTHER): Payer: Medicare Other | Admitting: Cardiovascular Disease

## 2018-11-19 ENCOUNTER — Encounter: Payer: Self-pay | Admitting: Cardiovascular Disease

## 2018-11-19 VITALS — BP 181/57 | HR 64 | Ht 64.5 in | Wt 156.0 lb

## 2018-11-19 DIAGNOSIS — I779 Disorder of arteries and arterioles, unspecified: Secondary | ICD-10-CM

## 2018-11-19 DIAGNOSIS — I6523 Occlusion and stenosis of bilateral carotid arteries: Secondary | ICD-10-CM | POA: Diagnosis not present

## 2018-11-19 DIAGNOSIS — I739 Peripheral vascular disease, unspecified: Secondary | ICD-10-CM

## 2018-11-19 MED ORDER — TELMISARTAN 80 MG PO TABS: 80.0000 mg | ORAL_TABLET | Freq: Every day | ORAL | 3 refills | Status: DC

## 2018-11-19 MED ORDER — HYDROCHLOROTHIAZIDE 25 MG PO TABS: 25.0000 mg | ORAL_TABLET | Freq: Every day | ORAL | 1 refills | Status: DC

## 2018-11-19 NOTE — Patient Instructions (Signed)
Medication Instructions:  Your physician recommends that you continue on your current medications as directed. Please refer to the Current Medication list given to you today.  If you need a refill on your cardiac medications before your next appointment, please call your pharmacy.   Lab work: NONE If you have labs (blood work) drawn today and your tests are completely normal, you will receive your results only by: Marland Kitchen MyChart Message (if you have MyChart) OR . A paper copy in the mail If you have any lab test that is abnormal or we need to change your treatment, we will call you to review the results.  Testing/Procedures: Your physician has requested that you have an aorta/iliac duplex. During this test, an ultrasound is used to evaluate blood flow to the aorta and iliac arteries. Allow one hour for this exam. Do not eat after midnight the day before and avoid carbonated beverages. SCHEDULE NOW  Your physician has requested that you have an ankle brachial index (ABI). During this test an ultrasound and blood pressure cuff are used to evaluate the arteries that supply the arms and legs with blood. Allow thirty minutes for this exam. There are no restrictions or special instructions. SCHEDULE NOW  Your physician has requested that you have a carotid duplex. This test is an ultrasound of the carotid arteries in your neck. It looks at blood flow through these arteries that supply the brain with blood. Allow one hour for this exam. There are no restrictions or special instructions. SCHEDULE FOR January 2021; [place] physician has requested that you have a lower or upper extremity arterial duplex. This test is an ultrasound of the arteries in the legs or arms. It looks at arterial blood flow in the legs and arms. Allow one hour for Lower and Upper Arterial scans. There are no restrictions or special instructions SCHEDULE January 2021; ANNUAL STUDY  Follow-Up: At [facility], you and  your health needs are our priority.  As part of our continuing mission to provide you with exceptional heart care, we have created designated Provider Care Teams.  These Care Teams include your primary Cardiologist (physician) and Advanced Practice Providers (APPs -  Physician Assistants and Nurse Practitioners) who all work together to provide you with the care you need, when you need it. . You may schedule a follow up appointment AS NEEDED. You may see Dr. Gwenlyn Found or one of the following Advanced Practice Providers on your designated Care Team:   . Kerin Ransom, [place] . Almyra Deforest, PA-C . Fabian Sharp, PA-C . Jory Sims, DNP . Rosaria Ferries, PA-C . Roby Lofts, PA-C . Sande Rives, PA-C

## 2018-11-19 NOTE — Progress Notes (Signed)
11/19/2018 [name]   1941-03-28  [id number]  Primary Physician [name] Small, MD Primary Cardiologist: [name] Harp MD [name], [place]  HPI:  [name] is a 78 y.o.  mildly overweight widowed Caucasian female mother of 27, grandmother 7 grandchildren referred by Dr. Jacqualyn [name], her podiatrist for critical limb ischemia.    I last saw her in the office 10/14/2018 she does have a history of treated hypertension, diabetes and hyperlipidemia.  She is never had a heart attack or stroke.  She denies chest pain or shortness of breath.  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  She has some local trauma related to socks over Christmas and has developed ischemic appearing wounds on her right second and third toes with recent Dopplers performed 10/09/2018 revealing a right ABI 0.75, left 2.79 with moderately severe right SFA stenosis and an occluded right posterior tibial artery.  She is seeing Dr. Jacqualyn [name] back regularly for aggressive local wound care.  Since I saw her a month ago the wounds on her right second and third toes have subsequently healed.  She recently saw Dr. Jacqualyn [name] approximately a week ago.  She does complain of left hip pain with ambulation of left calf claudication.  Her Dopplers performed 10/09/2018 did show an occluded left popliteal artery.  Her claudication is not at the point where she desires an aggressive approach.   Current Meds  Medication Sig  . Ascorbic Acid (VITAMIN C) 1000 MG tablet Take 1,000 mg by mouth daily.  Marland Kitchen aspirin 81 MG tablet Take 81 mg by mouth daily.  Marland Kitchen atorvastatin (LIPITOR) 20 MG tablet Take 1 tablet (20 mg total) by mouth daily.  . Canagliflozin (INVOKANA PO) Take by mouth.  Marland Kitchen CINNAMON PO Take 1,000 mg by mouth 2 (two) times daily.   Marland Kitchen diltiazem (CARTIA XT) 240 MG 24 hr capsule Take 1 capsule (240 mg total) by mouth daily.  . fish oil-omega-3 fatty acids 1000 MG capsule Take 1 g by mouth daily. TAKE 1 TABLET BY MOUTH 4 TIMES WEEKLY.  Marland Kitchen  glipiZIDE (GLUCOTROL XL) 5 MG 24 hr tablet TAKE 1 TABLET BY MOUTH ONCE DAILY WITH BREAKFAST  . hydrochlorothiazide (HYDRODIURIL) 25 MG tablet Take 1 tablet (25 mg total) by mouth daily.  Marland Kitchen JANUVIA 100 MG tablet TAKE 1 TABLET BY MOUTH ONCE DAILY  . losartan (COZAAR) 100 MG tablet Take 100 mg by mouth daily.  . Magnesium 500 MG TABS Take 0.5 tablets by mouth daily.   . metFORMIN (GLUCOPHAGE) 1000 MG tablet Take 1 tablet (1,000 mg total) by mouth 2 (two) times daily with a meal.  . metoprolol (LOPRESSOR) 100 MG tablet Take half tab in the morning and a whole tab in the evening. (Patient taking differently: Take 100 mg by mouth 2 (two) times daily. TAKE 1 TABLET TWICE A DAY)  . omeprazole (PRILOSEC) 20 MG capsule Take 20 mg by mouth 2 (two) times a week.   . Semaglutide,0.25 or 0.5MG /DOS, (OZEMPIC, 0.25 OR 0.5 MG/DOSE,) 2 MG/1.5ML SOPN Inject 0.5 mg into the skin once a week.  . telmisartan (MICARDIS) 80 MG tablet Take 1 tablet (80 mg total) by mouth daily.  Marland Kitchen terbinafine (LAMISIL) 250 MG tablet Take 250 mg by mouth daily.  . vitamin B-12 (CYANOCOBALAMIN) 250 MCG tablet Take 1,000 mcg by mouth daily.  . Vitamin D-Vitamin K (VITAMIN K2-VITAMIN D3 PO) Take by mouth. Take 1 tablet by mouth 3 times weekly     Allergies  Allergen Reactions  .  Demerol [Meperidine]   . Scopolamine     Social History   Socioeconomic History  . Marital status: Widowed    Spouse name: Not on file  . Number of children: Not on file  . Years of education: Not on file  . Highest education level: Not on file  Occupational History  . Not on file  Social Needs  . Financial resource strain: Not on file  . Food insecurity:    Worry: Not on file    Inability: Not on file  . Transportation needs:    Medical: Not on file    Non-medical: Not on file  Tobacco Use  . Smoking status: Former Smoker    Last attempt to quit: 03/31/1972    Years since quitting: 46.6  . Smokeless tobacco: Never Used  Substance and Sexual  Activity  . Alcohol use: No  . Drug use: No  . Sexual activity: Not on file  Lifestyle  . Physical activity:    Days per week: Not on file    Minutes per session: Not on file  . Stress: Not on file  Relationships  . Social connections:    Talks on phone: Not on file    Gets together: Not on file    Attends religious service: Not on file    Active member of club or organization: Not on file    Attends meetings of clubs or organizations: Not on file    Relationship status: Not on file  . Intimate partner violence:    Fear of current or ex partner: Not on file    Emotionally abused: Not on file    Physically abused: Not on file    Forced sexual activity: Not on file  Other Topics Concern  . Not on file  Social History Narrative  . Not on file     Review of Systems: General: negative for chills, fever, night sweats or weight changes.  Cardiovascular: negative for chest pain, dyspnea on exertion, edema, orthopnea, palpitations, paroxysmal nocturnal dyspnea or shortness of breath Dermatological: negative for rash Respiratory: negative for cough or wheezing Urologic: negative for hematuria Abdominal: negative for nausea, vomiting, diarrhea, bright red blood per rectum, melena, or hematemesis Neurologic: negative for visual changes, syncope, or dizziness All other systems reviewed and are otherwise negative except as noted above.    Blood pressure (!) 181/57, pulse 64, height 5' 4.5" (1.638 m), weight 156 lb (70.8 kg), SpO2 99 %.  General appearance: alert and no distress Neck: no adenopathy, no carotid bruit, no JVD, supple, symmetrical, trachea midline and thyroid not enlarged, symmetric, no tenderness/mass/nodules Lungs: clear to auscultation bilaterally Heart: regular rate and rhythm, S1, S2 normal, no murmur, click, rub or gallop Extremities: extremities normal, atraumatic, no cyanosis or edema Pulses: Diminished pedal pulses bilaterally Skin: The wounds on her right  second and third toes have almost completely healed Neurologic: Alert and oriented X 3, normal strength and tone. Normal symmetric reflexes. Normal coordination and gait  EKG not performed today  ASSESSMENT AND PLAN:   PAD (peripheral artery disease) History of critical limb ischemia with a slow healing ulcer on her right second and third toes.  She did have Dopplers that showed a right ABI 0.75, a moderate stenosis in the mid right SFA and an occluded right posterior tibial artery.  Interestingly, she did have an occluded left popliteal artery and complains of left hip pain with ambulation as well as left calf pain as well although not lifestyle limiting to  the point where she wants intervention.  The wounds on her right second and third toes have subsequently healed according to Dr. Leigh Aurora note and on exam today.      [name] Harp MD FACP,FACC,FAHA, [facility] 11/19/2018 10:35 AM

## 2018-11-19 NOTE — Assessment & Plan Note (Signed)
History of critical limb ischemia with a slow healing ulcer on her right second and third toes.  She did have Dopplers that showed a right ABI 0.75, a moderate stenosis in the mid right SFA and an occluded right posterior tibial artery.  Interestingly, she did have an occluded left popliteal artery and complains of left hip pain with ambulation as well as left calf pain as well although not lifestyle limiting to the point where she wants intervention.  The wounds on her right second and third toes have subsequently healed according to Dr. Leigh Aurora note and on exam today.

## 2018-11-20 ENCOUNTER — Telehealth: Payer: Self-pay | Admitting: Podiatry

## 2018-11-20 NOTE — Telephone Encounter (Signed)
If it is healed then she does not need to come. If she has any issues she can always let me know. I do think we should follow up in at least 3 months for a overall foot check given her circulation. Thanks.

## 2018-11-20 NOTE — Telephone Encounter (Signed)
Pt called and seen Dr Gwenlyn Found yesterday and he was really pleased with the healing of the wound.Pt asked since the wound is healed does she need to keep her appt with you on Monday  3.2.2020? Please advise

## 2018-11-24 ENCOUNTER — Ambulatory Visit: Payer: Medicare Other | Admitting: Podiatry

## 2018-11-26 DIAGNOSIS — Z1231 Encounter for screening mammogram for malignant neoplasm of breast: Secondary | ICD-10-CM | POA: Diagnosis not present

## 2018-11-26 DIAGNOSIS — Z803 Family history of malignant neoplasm of breast: Secondary | ICD-10-CM | POA: Diagnosis not present

## 2018-12-05 ENCOUNTER — Other Ambulatory Visit: Payer: Medicare Other

## 2018-12-09 ENCOUNTER — Ambulatory Visit: Payer: Medicare Other | Admitting: Endocrinology

## 2018-12-19 ENCOUNTER — Encounter (HOSPITAL_COMMUNITY): Payer: Medicare Other

## 2018-12-20 ENCOUNTER — Other Ambulatory Visit: Payer: Self-pay | Admitting: Endocrinology

## 2019-01-12 ENCOUNTER — Other Ambulatory Visit: Payer: Medicare Other

## 2019-01-14 ENCOUNTER — Ambulatory Visit: Payer: Medicare Other | Admitting: Endocrinology

## 2019-01-22 ENCOUNTER — Other Ambulatory Visit: Payer: Self-pay | Admitting: Internal Medicine

## 2019-01-22 ENCOUNTER — Other Ambulatory Visit: Payer: Self-pay | Admitting: Endocrinology

## 2019-02-19 ENCOUNTER — Other Ambulatory Visit: Payer: Self-pay | Admitting: Endocrinology

## 2019-03-18 ENCOUNTER — Other Ambulatory Visit (INDEPENDENT_AMBULATORY_CARE_PROVIDER_SITE_OTHER): Payer: Medicare Other

## 2019-03-18 ENCOUNTER — Other Ambulatory Visit: Payer: Self-pay

## 2019-03-18 DIAGNOSIS — E1165 Type 2 diabetes mellitus with hyperglycemia: Secondary | ICD-10-CM

## 2019-03-18 LAB — BASIC METABOLIC PANEL
BUN: 27 mg/dL — ABNORMAL HIGH (ref 6–23)
CO2: 30 mEq/L (ref 19–32)
Calcium: 10.2 mg/dL (ref 8.4–10.5)
Chloride: 101 mEq/L (ref 96–112)
Creatinine, Ser: 0.99 mg/dL (ref 0.40–1.20)
GFR: 54.39 mL/min — ABNORMAL LOW (ref >60.00)
Glucose, Bld: 170 mg/dL — ABNORMAL HIGH (ref 70–99)
Potassium: 4.6 mEq/L (ref 3.5–5.1)
Sodium: 140 mEq/L (ref 135–145)

## 2019-03-19 LAB — FRUCTOSAMINE: Fructosamine: 243 umol/L (ref 0–285)

## 2019-03-23 ENCOUNTER — Other Ambulatory Visit: Payer: Self-pay | Admitting: Endocrinology

## 2019-03-30 ENCOUNTER — Other Ambulatory Visit: Payer: Self-pay

## 2019-03-30 ENCOUNTER — Encounter: Payer: Self-pay | Admitting: Endocrinology

## 2019-03-30 ENCOUNTER — Ambulatory Visit (INDEPENDENT_AMBULATORY_CARE_PROVIDER_SITE_OTHER): Payer: Medicare Other | Admitting: Endocrinology

## 2019-03-30 VITALS — BP 112/60 | HR 62 | Ht 64.5 in | Wt 147.8 lb

## 2019-03-30 DIAGNOSIS — E1165 Type 2 diabetes mellitus with hyperglycemia: Secondary | ICD-10-CM

## 2019-03-30 DIAGNOSIS — I6523 Occlusion and stenosis of bilateral carotid arteries: Secondary | ICD-10-CM | POA: Diagnosis not present

## 2019-03-30 LAB — POCT GLYCOSYLATED HEMOGLOBIN (HGB A1C): Hemoglobin A1C: 6.7 % — AB (ref 4.0–5.6)

## 2019-03-30 NOTE — Progress Notes (Signed)
Patient ID: [name], female   DOB: 01-21-1941, 78 y.o.   MRN: [id number]          Reason for Appointment: Follow-up for endocrinology problems  Referring physician: Darcus Austin   History of Present Illness:          Date of diagnosis of type 2 diabetes mellitus: 1984       Background history:   She has been on metformin since about the time of her diagnosis and has been on other drugs also but has not had many changes in her regimen in the last several years She thinks her blood sugar used to be fairly well controlled At some point she was given Byetta for about a year and a half which improved her control but this was subsequently start because of it being too expensive for her.  She had no side effects with this  Recent history:   Her A1c is 6.7, previously 7.7 and fructosamine is better at 243  Non-insulin hypoglycemic drugs: Glipizide ER 5 mg daily, Invokana 300 mg daily, Ozempic 0.5 mg weekly, metformin 1 g twice daily  Current management, blood sugar patterns and problems identified:  Since her blood sugars were higher and A1c progressively increasing in 1/20 she was started on Ozempic and Januvia was stopped  She did not follow-up subsequently  However although she has some inconsistent high blood sugars related to carbohydrate intake overall control is much better  Average blood sugar on her meter is still high at 166 compared to 170 on the last visit  She did lose some weight initially, previously had gained 3 pounds  Also is able to cut back on portions with Ozempic currently  No hypoglycemia also with continuing glipizide  She has checked some readings at different times of the day but more in the morning and highest blood sugar is 233 after a high carbohydrate dinner        Side effects from medications have been: None  Compliance with the medical regimen: Good Hypoglycemia:   None  Glucose monitoring:  done only 0.5 times a day         Glucometer: One  Touch.       Blood Glucose readings by time of day and averages from meter download:   PRE-MEAL Fasting Lunch Dinner Bedtime Overall  Glucose range:  132-212     119-233  Mean/median:  162     166   POST-MEAL PC Breakfast PC Lunch PC Dinner  Glucose range:   131-181   Mean/median:   163    Previous readings:  PRE-MEAL Fasting Lunch Dinner Bedtime Overall  Glucose range:  128-185      Mean/median:  167     170   POST-MEAL PC Breakfast PC Lunch PC Dinner  Glucose range:  167-242  210, 271   Mean/median:        Self-care: The diet that the patient has been following is: tries to limit sweets and fried food.     Meal times are:  Breakfast is at 8:30 AM lunch: 1 PM, dinner: 6:30 PM  Typical meal intake: Breakfast is oatmeal with fruit Lunch is usually soup with crackers, dinner is usually a low-fat meat with vegetables Snacks will be celery and ranch dressing               Dietician visit, most recent: 10/18               Exercise: Walking 10 minutes,  2-3 times a day  Weight history:  Wt Readings from Last 3 Encounters:  03/30/19 147 lb 12.8 oz (67 kg)  11/19/18 156 lb (70.8 kg)  10/23/18 155 lb 6.4 oz (70.5 kg)    Glycemic control:   Lab Results  Component Value Date   HGBA1C 6.7 (A) 03/30/2019   HGBA1C 7.7 (A) 10/23/2018   HGBA1C 7.4 (H) 07/25/2018   Lab Results  Component Value Date   LDLCALC 87 04/06/2013   CREATININE 0.99 03/18/2019   No results found for: [facility]  Lab Results  Component Value Date   FRUCTOSAMINE 243 03/18/2019   FRUCTOSAMINE 268 10/21/2018   FRUCTOSAMINE 267 05/30/2018      Allergies as of 03/30/2019      Reactions   Demerol [meperidine]    Scopolamine       Medication List       Accurate as of March 30, 2019  9:58 AM. If you have any questions, ask your nurse or doctor.        STOP taking these medications   Januvia 100 MG tablet Generic drug: sitaGLIPtin Stopped by: Elayne Snare, MD     TAKE these medications    aspirin 81 MG tablet Take 81 mg by mouth daily.   atorvastatin 20 MG tablet Commonly known as: LIPITOR Take 1 tablet by mouth once daily   CINNAMON PO Take 1,000 mg by mouth 2 (two) times daily.   diltiazem 240 MG 24 hr capsule Commonly known as: Cartia XT Take 1 capsule (240 mg total) by mouth daily.   fish oil-omega-3 fatty acids 1000 MG capsule Take 1 g by mouth daily. TAKE 1 TABLET BY MOUTH 4 TIMES WEEKLY.   glipiZIDE 5 MG 24 hr tablet Commonly known as: GLUCOTROL XL TAKE 1 TABLET BY MOUTH ONCE DAILY WITH BREAKFAST   hydrochlorothiazide 25 MG tablet Commonly known as: HYDRODIURIL Take 1 tablet (25 mg total) by mouth daily.   INVOKANA PO Take by mouth.   Invokana 100 MG Tabs tablet Generic drug: canagliflozin TAKE 1 TABLET BY MOUTH BEFORE BREAKFAST   Magnesium 500 MG Tabs Take 0.5 tablets by mouth daily.   metFORMIN 1000 MG tablet Commonly known as: GLUCOPHAGE Take 1 tablet (1,000 mg total) by mouth 2 (two) times daily with a meal.   metoprolol tartrate 100 MG tablet Commonly known as: LOPRESSOR Take half tab in the morning and a whole tab in the evening. What changed:   how much to take  how to take this  when to take this  additional instructions   omeprazole 20 MG capsule Commonly known as: PRILOSEC Take 20 mg by mouth 2 (two) times a week.   Ozempic (0.25 or 0.5 MG/DOSE) 2 MG/1.5ML Sopn Generic drug: Semaglutide(0.25 or 0.5MG /DOS) INJECT 0.5 MG INTO THE SKIN ONCE A WEEK   telmisartan 80 MG tablet Commonly known as: MICARDIS Take 1 tablet (80 mg total) by mouth daily.   terbinafine 250 MG tablet Commonly known as: LAMISIL Take 250 mg by mouth daily.   vitamin B-12 250 MCG tablet Commonly known as: CYANOCOBALAMIN Take 1,000 mcg by mouth daily.   vitamin C 1000 MG tablet Take 1,000 mg by mouth daily.   VITAMIN K2-VITAMIN D3 PO Take by mouth. Take 1 tablet by mouth 3 times weekly       Allergies:  Allergies  Allergen Reactions  .  Demerol [Meperidine]   . Scopolamine     Past Medical History:  Diagnosis Date  . Diabetes mellitus without complication (  [place])   . Hyperlipidemia   . Hypertension 11/20/11   ECHO- EF>55% Borderline concentric left ventricular hypertrophy. There is a small calcified mass in the L:A near the LA appendage. No valvular masses seen with associated mitral annular calcification. LA Volume/ BSA27.4 ml/m2 No AS. Right ventricular systolic pressure is elevated at 68mmHg.    Past Surgical History:  Procedure Laterality Date  . ABDOMINAL HYSTERECTOMY      Family History  Problem Relation Age of Onset  . Cancer - Prostate Father   . Cancer - Colon Father   . Stroke Mother   . Hypertension Mother   . Hyperlipidemia Mother   . Melanoma Brother     Social History:  reports that she quit smoking about 47 years ago. She has never used smokeless tobacco. She reports that she does not drink alcohol or use drugs.   Review of Systems   Lipid history: Hypercholesterolemia by PCP with 20 mg Lipitor    Lab Results  Component Value Date   CHOL 164 04/06/2013   HDL 40 04/06/2013   LDLCALC 87 04/06/2013   TRIG 187 (H) 04/06/2013           Hypertension: Present for several years, currently treated with  Micardis 100 mg, taking this in the morning   She think blood pressure is 110/50 HCTZ was started back on her last visit  BP Readings from Last 3 Encounters:  03/30/19 112/60  11/19/18 (!) 181/57  10/23/18 118/64    Most recent eye exam was in 9/19  Most recent foot exam: 04/2018  She complains of pain on walking but this is mostly in the lateral upper thigh and lateral lower back area, reportedly has PVD   LABS:  Office Visit on 03/30/2019  Component Date Value Ref Range Status  . Hemoglobin A1C 03/30/2019 6.7* 4.0 - 5.6 % Final    Physical Examination:  BP 112/60 (BP Location: Left Arm, Patient Position: Sitting, Cuff Size: Normal)   Pulse 62   Ht 5' 4.5" (1.638 m)   Wt  147 lb 12.8 oz (67 kg)   SpO2 97%   BMI 24.98 kg/m       ASSESSMENT:  Diabetes type 2, uncontrolled  See history of present illness for detailed discussion of current diabetes management, blood sugar patterns and problems identified  Her A1c is 6.7 and improved by 1%  She has benefited from [place] even though she has lost some weight However her BMI is about 25 which is adequate now  She finds that she is getting high readings after eating certain carbohydrates like rice and potatoes but her blood sugars are generally reasonably controlled Highest blood sugars are still occasionally in the morning Also benefiting from continuing Invokana  Hypertension: Well controlled usually, she did to have lower readings at home and also today in the office with using Micardis instead of losartan Renal function stable  PLAN:   She will continue same regimen including Invokana and Ozempic Make sure she is checking readings after meals Continue to modify diet with smaller portions of carbohydrate She will cut the HCTZ in half to avoid any low normal or low blood pressure readings along with continuing Invokana Follow-up with PCP and cardiologist as scheduled   Patient Instructions  Check blood sugars on waking up 2-3 days a week  Also check blood sugars about 2 hours after meals and do this after different meals by rotation  Recommended blood sugar levels on waking up are 90-130 and  about 2 hours after meal is 130-160  Please bring your blood sugar monitor to each visit, thank you  1/2 HCTZ      Elayne Snare 03/30/2019, 9:58 AM   Note: This office note was prepared with Dragon voice recognition system technology. Any transcriptional errors that result from this process are unintentional.

## 2019-03-30 NOTE — Patient Instructions (Signed)
Check blood sugars on waking up 2-3 days a week  Also check blood sugars about 2 hours after meals and do this after different meals by rotation  Recommended blood sugar levels on waking up are 90-130 and about 2 hours after meal is 130-160  Please bring your blood sugar monitor to each visit, thank you  1/2 HCTZ

## 2019-04-09 ENCOUNTER — Encounter (HOSPITAL_COMMUNITY): Payer: Medicare Other

## 2019-04-21 ENCOUNTER — Other Ambulatory Visit: Payer: Self-pay | Admitting: Endocrinology

## 2019-04-24 ENCOUNTER — Ambulatory Visit (HOSPITAL_BASED_OUTPATIENT_CLINIC_OR_DEPARTMENT_OTHER)
Admission: RE | Admit: 2019-04-24 | Discharge: 2019-04-24 | Disposition: A | Discharge status: Home / Self Care | Payer: Medicare Other | Source: Ambulatory Visit | Attending: Cardiovascular Disease | Admitting: Cardiovascular Disease

## 2019-04-24 ENCOUNTER — Other Ambulatory Visit: Payer: Self-pay

## 2019-04-24 ENCOUNTER — Ambulatory Visit (HOSPITAL_COMMUNITY)
Admission: RE | Admit: 2019-04-24 | Discharge: 2019-04-24 | Disposition: A | Discharge status: Home / Self Care | Payer: Medicare Other | Source: Ambulatory Visit | Attending: Cardiology | Admitting: Cardiology

## 2019-04-24 DIAGNOSIS — I739 Peripheral vascular disease, unspecified: Secondary | ICD-10-CM

## 2019-04-28 DIAGNOSIS — E13622 Other specified diabetes mellitus with other skin ulcer: Secondary | ICD-10-CM | POA: Diagnosis not present

## 2019-04-28 DIAGNOSIS — Z85828 Personal history of other malignant neoplasm of skin: Secondary | ICD-10-CM | POA: Diagnosis not present

## 2019-04-30 ENCOUNTER — Encounter: Payer: Self-pay | Admitting: Podiatry

## 2019-04-30 ENCOUNTER — Other Ambulatory Visit: Payer: Self-pay

## 2019-04-30 ENCOUNTER — Ambulatory Visit (INDEPENDENT_AMBULATORY_CARE_PROVIDER_SITE_OTHER): Payer: Medicare Other | Admitting: Podiatry

## 2019-04-30 DIAGNOSIS — L97511 Non-pressure chronic ulcer of other part of right foot limited to breakdown of skin: Secondary | ICD-10-CM

## 2019-04-30 DIAGNOSIS — I739 Peripheral vascular disease, unspecified: Secondary | ICD-10-CM | POA: Diagnosis not present

## 2019-04-30 DIAGNOSIS — T148XXA Other injury of unspecified body region, initial encounter: Secondary | ICD-10-CM | POA: Diagnosis not present

## 2019-04-30 DIAGNOSIS — I6523 Occlusion and stenosis of bilateral carotid arteries: Secondary | ICD-10-CM

## 2019-04-30 MED ORDER — CEPHALEXIN 500 MG PO CAPS: 500.0000 mg | ORAL_CAPSULE | Freq: Three times a day (TID) | ORAL | 0 refills | Status: DC

## 2019-05-05 ENCOUNTER — Encounter: Payer: Self-pay | Admitting: Cardiovascular Disease

## 2019-05-05 ENCOUNTER — Other Ambulatory Visit: Payer: Self-pay

## 2019-05-05 ENCOUNTER — Ambulatory Visit (INDEPENDENT_AMBULATORY_CARE_PROVIDER_SITE_OTHER): Payer: Medicare Other | Admitting: Cardiovascular Disease

## 2019-05-05 VITALS — BP 124/60 | HR 64 | Temp 96.8°F | Ht 64.5 in | Wt 147.0 lb

## 2019-05-05 DIAGNOSIS — I739 Peripheral vascular disease, unspecified: Secondary | ICD-10-CM

## 2019-05-05 DIAGNOSIS — I6523 Occlusion and stenosis of bilateral carotid arteries: Secondary | ICD-10-CM | POA: Diagnosis not present

## 2019-05-05 NOTE — Assessment & Plan Note (Signed)
History of PAD with trauma related ulcers on right and left second toes which have all but healed.  Her ABIs recently assessed 10/09/2018 4.75 on the right and 0.79 on the left.  She did appear to have a moderately high-grade mid right SFA stenosis and an occluded left popliteal artery as well as some tibial vessel disease.  The current time I recommend continued offloading and aggressive local wound care.  She does not wish an aggressive approach to her claudication.  We will continue to follow her conservatively.

## 2019-05-05 NOTE — Assessment & Plan Note (Signed)
History of hyperlipidemia on atorvastatin with lipid profile performed by her PCP 05/22/2018 revealing total cholesterol 147, LDL 69 HDL 33.

## 2019-05-05 NOTE — Assessment & Plan Note (Signed)
History of essential hypertension with blood pressure measured today 124/60.  She is on diltiazem, and hydrochlorothiazide as well as metoprolol.

## 2019-05-05 NOTE — Patient Instructions (Signed)
Medication Instructions:  Your physician recommends that you continue on your current medications as directed. Please refer to the Current Medication list given to you today.  If you need a refill on your cardiac medications before your next appointment, please call your pharmacy.   Lab work: none If you have labs (blood work) drawn today and your tests are completely normal, you will receive your results only by: Marland Kitchen MyChart Message (if you have MyChart) OR . A paper copy in the mail If you have any lab test that is abnormal or we need to change your treatment, we will call you to review the results.  Testing/Procedures: Your physician has requested that you have a carotid duplex. This test is an ultrasound of the carotid arteries in your neck. It looks at blood flow through these arteries that supply the brain with blood. Allow one hour for this exam. There are no restrictions or special instructions. TO BE SCHEDULED FOR January 2020  Your physician has requested that you have an aorta/iliac duplex. During this test, an ultrasound is used to evaluate blood flow to the aorta and iliac arteries. Allow one hour for this exam. Do not eat after midnight the day before and avoid carbonated beverages. TO BE SCHEDULED FOR January 2020  Your physician has requested that you have an ankle brachial index (ABI). During this test an ultrasound and blood pressure cuff are used to evaluate the arteries that supply the arms and legs with blood. Allow thirty minutes for this exam. There are no restrictions or special instructions. TO BE SCHEDULED FOR January 2020   Follow-Up: At [facility], you and your health needs are our priority.  As part of our continuing mission to provide you with exceptional heart care, we have created designated Provider Care Teams.  These Care Teams include your primary Cardiologist (physician) and Advanced Practice Providers (APPs -  Physician Assistants and Nurse  Practitioners) who all work together to provide you with the care you need, when you need it. You will need a follow up appointment in 12 months WITH DR. Gwenlyn Found.  Please call our office 2 months in advance to schedule this appointment.

## 2019-05-05 NOTE — Progress Notes (Signed)
05/05/2019 [name]   1941-06-22  [id number]  Primary Physician Maurice Small, MD Primary Cardiologist: [name] MD [name], [place]  HPI:  [name] is a 78 y.o.  mildly overweight widowed Caucasian female mother of 44, grandmother 7 grandchildren referred by Dr. Jacqualyn Posey, her podiatrist for critical limb ischemia.   I last saw her in the office 11/19/2018 she does have a history of treated hypertension, diabetes and hyperlipidemia. She is never had a heart attack or stroke. She denies chest pain or shortness of breath. 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 She has some local trauma related to socks over Christmas and has developed ischemic appearing wounds on her right second and third toes with recent Dopplers performed 10/09/2018 revealing a right ABI 0.75, left 2.79 with moderately severe right SFA stenosis and an occluded right posterior tibial artery. She is seeing Dr. Jacqualyn Posey back regularly for aggressive local wound care.  Since I saw her in the office 6 months ago her wounds continue to heal on her right and left second toes.  These sores are probably a result of local trauma.  Recent ABIs are in the 8.6 range with a high-grade right SFA stenosis and an occluded left popliteal with tibial vessel disease as well.  She does complain of some claudication but still does not wish an aggressive approach.   Current Meds  Medication Sig  . Ascorbic Acid (VITAMIN C) 1000 MG tablet Take 1,000 mg by mouth daily.  Marland Kitchen aspirin 81 MG tablet Take 81 mg by mouth daily.  Marland Kitchen atorvastatin (LIPITOR) 20 MG tablet Take 1 tablet by mouth once daily  . Canagliflozin (INVOKANA PO) Take by mouth.  . cephALEXin (KEFLEX) 500 MG capsule Take 1 capsule (500 mg total) by mouth 3 (three) times daily.  Marland Kitchen CINNAMON PO Take 1,000 mg by mouth 2 (two) times daily.   Marland Kitchen diltiazem (CARTIA XT) 240 MG 24 hr capsule Take 1 capsule (240 mg total) by mouth daily.  . fish oil-omega-3 fatty acids  1000 MG capsule Take 1 g by mouth daily. TAKE 1 TABLET BY MOUTH 4 TIMES WEEKLY.  Marland Kitchen glipiZIDE (GLUCOTROL XL) 5 MG 24 hr tablet TAKE 1 TABLET BY MOUTH ONCE DAILY WITH BREAKFAST  . hydrochlorothiazide (HYDRODIURIL) 25 MG tablet Take 1 tablet (25 mg total) by mouth daily.  . INVOKANA 100 MG TABS tablet TAKE 1 TABLET BY MOUTH BEFORE BREAKFAST  . Magnesium 500 MG TABS Take 0.5 tablets by mouth daily.   . metFORMIN (GLUCOPHAGE) 1000 MG tablet Take 1 tablet (1,000 mg total) by mouth 2 (two) times daily with a meal.  . metoprolol (LOPRESSOR) 100 MG tablet Take half tab in the morning and a whole tab in the evening. (Patient taking differently: Take 100 mg by mouth 2 (two) times daily. TAKE 1 TABLET TWICE A DAY)  . omeprazole (PRILOSEC) 20 MG capsule Take 20 mg by mouth 2 (two) times a week.   Marland Kitchen OZEMPIC, 0.25 OR 0.5 MG/DOSE, 2 MG/1.5ML SOPN INJECT 0.5 MG INTO THE SKIN ONCE A WEEK  . telmisartan (MICARDIS) 80 MG tablet Take 1 tablet (80 mg total) by mouth daily.  . vitamin B-12 (CYANOCOBALAMIN) 250 MCG tablet Take 1,000 mcg by mouth daily.     Allergies  Allergen Reactions  . Demerol [Meperidine]   . Scopolamine     Social History   Socioeconomic History  . Marital status: Widowed    Spouse name: Not on file  . Number  of children: Not on file  . Years of education: Not on file  . Highest education level: Not on file  Occupational History  . Not on file  Social Needs  . Financial resource strain: Not on file  . Food insecurity    Worry: Not on file    Inability: Not on file  . Transportation needs    Medical: Not on file    Non-medical: Not on file  Tobacco Use  . Smoking status: Former Smoker    Quit date: 03/31/1972    Years since quitting: 47.1  . Smokeless tobacco: Never Used  Substance and Sexual Activity  . Alcohol use: No  . Drug use: No  . Sexual activity: Not on file  Lifestyle  . Physical activity    Days per week: Not on file    Minutes per session: Not on file  .  Stress: Not on file  Relationships  . Social Herbalist on phone: Not on file    Gets together: Not on file    Attends religious service: Not on file    Active member of club or organization: Not on file    Attends meetings of clubs or organizations: Not on file    Relationship status: Not on file  . Intimate partner violence    Fear of current or ex partner: Not on file    Emotionally abused: Not on file    Physically abused: Not on file    Forced sexual activity: Not on file  Other Topics Concern  . Not on file  Social History Narrative  . Not on file     Review of Systems: General: negative for chills, fever, night sweats or weight changes.  Cardiovascular: negative for chest pain, dyspnea on exertion, edema, orthopnea, palpitations, paroxysmal nocturnal dyspnea or shortness of breath Dermatological: negative for rash Respiratory: negative for cough or wheezing Urologic: negative for hematuria Abdominal: negative for nausea, vomiting, diarrhea, bright red blood per rectum, melena, or hematemesis Neurologic: negative for visual changes, syncope, or dizziness All other systems reviewed and are otherwise negative except as noted above.    Blood pressure 124/60, pulse 64, temperature (!) 96.8 F (36 C), height 5' 4.5" (1.638 m), weight 147 lb (66.7 kg).  General appearance: alert and no distress Neck: no adenopathy, no JVD, supple, symmetrical, trachea midline, thyroid not enlarged, symmetric, no tenderness/mass/nodules and Right carotid bruit Lungs: clear to auscultation bilaterally Heart: regular rate and rhythm, S1, S2 normal, no murmur, click, rub or gallop Extremities: extremities normal, atraumatic, no cyanosis or edema Pulses: Diminished pedal pulses Skin: Small ulcers covered by bandages on the right Neurologic: Alert and oriented X 3, normal strength and tone. Normal symmetric reflexes. Normal coordination and gait  EKG not performed today  ASSESSMENT  AND PLAN:   Essential hypertension History of essential hypertension with blood pressure measured today 124/60.  She is on diltiazem, and hydrochlorothiazide as well as metoprolol.  Dyslipidemia History of hyperlipidemia on atorvastatin with lipid profile performed by her PCP 05/22/2018 revealing total cholesterol 147, LDL 69 HDL 33.  Carotid artery disease History of carotid artery disease on exam with duplex performed 07/29/2018 revealing moderate right ICA stenosis.  This will be repeated on an annual basis.  PAD (peripheral artery disease) History of PAD with trauma related ulcers on right and left second toes which have all but healed.  Her ABIs recently assessed 10/09/2018 4.75 on the right and 0.79 on the left.  She did  appear to have a moderately high-grade mid right SFA stenosis and an occluded left popliteal artery as well as some tibial vessel disease.  The current time I recommend continued offloading and aggressive local wound care.  She does not wish an aggressive approach to her claudication.  We will continue to follow her conservatively.      [name] MD FACP,FACC,FAHA, [facility] 05/05/2019 8:36 AM

## 2019-05-05 NOTE — Assessment & Plan Note (Signed)
History of carotid artery disease on exam with duplex performed 07/29/2018 revealing moderate right ICA stenosis.  This will be repeated on an annual basis.

## 2019-05-07 ENCOUNTER — Ambulatory Visit (INDEPENDENT_AMBULATORY_CARE_PROVIDER_SITE_OTHER): Payer: Medicare Other | Admitting: Podiatry

## 2019-05-07 ENCOUNTER — Other Ambulatory Visit: Payer: Self-pay

## 2019-05-07 ENCOUNTER — Encounter: Payer: Self-pay | Admitting: Podiatry

## 2019-05-07 VITALS — Temp 97.9°F

## 2019-05-07 DIAGNOSIS — L97511 Non-pressure chronic ulcer of other part of right foot limited to breakdown of skin: Secondary | ICD-10-CM | POA: Diagnosis not present

## 2019-05-07 DIAGNOSIS — I739 Peripheral vascular disease, unspecified: Secondary | ICD-10-CM

## 2019-05-07 DIAGNOSIS — T148XXA Other injury of unspecified body region, initial encounter: Secondary | ICD-10-CM | POA: Diagnosis not present

## 2019-05-07 DIAGNOSIS — I6523 Occlusion and stenosis of bilateral carotid arteries: Secondary | ICD-10-CM

## 2019-05-11 NOTE — Progress Notes (Signed)
Subjective: 78 year old female presents the office today for concerns of blisters to both of her feet that started last week.  She states that she went for vascular testing and put a warm towel over her toes and by time she got home she noticed her toes had blisters.  She denies any drainage but since this started there is been redness of the toes. Denies any systemic complaints such as fevers, chills, nausea, vomiting. No acute changes since last appointment, and no other complaints at this time.   Objective: AAO x3, NAD DP/PT pulses palpable bilaterally, CRT less than 3 seconds Clear fluid-filled blisters present to bilateral distal hallux as well as second and third toes.  Is able to drain these and there is no purulence.  Also wound on the left fourth toe, medial aspect on the course of the PIPJ with a small eschar overlying the area.  There is no probing to bone, undermining or tunneling.  No ascending cellulitis. No pain with calf compression, swelling, warmth, erythema      Assessment: Blisters on the left fourth toe wound; PAD  Plan: -All treatment options discussed with the patient including all alternatives, risks, complications.  -I prepped the blisters with Betadine and is an 18-gauge needle to puncture to drain the area that did not break the skin.  Recommend small meta Betadine to the wounds as well as the blisters daily. -Keflex -Open toed shoes. -She has a follow-up with Dr. Alvester Chou to discuss ultrasound results. -Monitor for any clinical signs or symptoms of infection and directed to call the office immediately should any occur or go to the ER. -RTC 1 week -Patient encouraged to call the office with any questions, concerns, change in symptoms.   Trula Slade DPM

## 2019-05-14 ENCOUNTER — Other Ambulatory Visit: Payer: Self-pay

## 2019-05-14 ENCOUNTER — Encounter: Payer: Self-pay | Admitting: Podiatry

## 2019-05-14 ENCOUNTER — Ambulatory Visit (INDEPENDENT_AMBULATORY_CARE_PROVIDER_SITE_OTHER): Payer: Medicare Other | Admitting: Podiatry

## 2019-05-14 VITALS — Temp 97.0°F

## 2019-05-14 DIAGNOSIS — I6523 Occlusion and stenosis of bilateral carotid arteries: Secondary | ICD-10-CM

## 2019-05-14 DIAGNOSIS — I739 Peripheral vascular disease, unspecified: Secondary | ICD-10-CM

## 2019-05-14 DIAGNOSIS — L97511 Non-pressure chronic ulcer of other part of right foot limited to breakdown of skin: Secondary | ICD-10-CM

## 2019-05-14 DIAGNOSIS — T148XXA Other injury of unspecified body region, initial encounter: Secondary | ICD-10-CM

## 2019-05-17 NOTE — Progress Notes (Signed)
Subjective: 78 year old female presents the office today for follow-up evaluation of blisters to both of her feet.  She states they are doing somewhat better.  She not having any drainage.  She been keeping Betadine areas and keeping it clean.  She is also had her nails be trimmed today's are thickened elongated she cannot trim herself.   Denies any systemic complaints such as fevers, chills, nausea, vomiting. No acute changes since last appointment, and no other complaints at this time.   Objective: AAO x3, NAD Clear fluid-filled blisters present to bilateral distal hallux as well as second and third toes.  Overall they have improved with urinating.  Some chronic erythema to the digits but there is no increase in warmth.  No ascending cellulitis.  There is still a wound present more blood work still on the PIPJ with a small eschar.  There is no fluctuation crepitation. Nails are hypertrophic, dystrophic, brittle, discolored, elongated 10. No surrounding redness or drainage. Tenderness nails 1-5 bilaterally. No open lesions or pre-ulcerative lesions are identified today. o pain with calf compression, swelling, warmth, erythema      Assessment: Blisters on the left fourth toe wound; PAD; symptomatic onychomycosis  Plan: -All treatment options discussed with the patient including all alternatives, risks, complications.  -There is still some fluid present in the hallux blisters and I did prepped with Betadine drainage today.  Continue Betadine dressing changes daily.  She did follow-up with Dr. Alvester Chou as well in regards to circulation.  Offloading. -Monitor for any clinical signs or symptoms of infection and directed to call the office immediately should any occur or go to the ER.  Return in about 1 week (around 05/14/2019).  Trula Slade DPM

## 2019-05-20 NOTE — Progress Notes (Signed)
Subjective: 78 year old female presents the office today for follow-up evaluation of blisters to both of her feet.  They seem to be healing obviously.  Denies any drainage or swelling.  The redness is improving.  She still has a wound on the left fourth toe which she states is been ongoing for some time.  This is present prior to the blisters.  Denies any drainage or pus.  She still keeping a dressing on the wound daily.  Denies any systemic complaints such as fevers, chills, nausea, vomiting. No acute changes since last appointment, and no other complaints at this time.   Objective: AAO x3, NAD There is skin breakdown present to the distal aspects of the digits although improving.  It does appear that they are starting to heal.  Is still a small eschar present on the lateral aspect of the left fourth digit on the PIPJ.  This is dry.  There is no drainage or pus.  There is no significant swelling there is no erythema or warmth.  No pain. No other open lesions are identified. No pain with calf compression, swelling, warmth, erythema.   Assessment: Blisters on the left fourth toe wound; PAD  Plan: -All treatment options discussed with the patient including all alternatives, risks, complications.  -Plan continue Betadine wet-to-dry dressing changes on toes daily.  They are healing although slowly.  Recommend small amount of antibiotic ointment on the left fourth toe. -Given the slow healing wound on the left fourth toe we discussed circulation.  She is amenable to possible intervention for circulation if needed.  I will discuss with Dr. Gwenlyn Found. -Monitor for any clinical signs or symptoms of infection and directed to call the office immediately should any occur or go to the ER.  Return in about 2 weeks (around 05/28/2019).  Trula Slade DPM

## 2019-05-21 ENCOUNTER — Other Ambulatory Visit: Payer: Self-pay | Admitting: Endocrinology

## 2019-05-28 ENCOUNTER — Ambulatory Visit (INDEPENDENT_AMBULATORY_CARE_PROVIDER_SITE_OTHER): Payer: Medicare Other | Admitting: Podiatry

## 2019-05-28 ENCOUNTER — Encounter: Payer: Self-pay | Admitting: Podiatry

## 2019-05-28 ENCOUNTER — Other Ambulatory Visit: Payer: Self-pay

## 2019-05-28 DIAGNOSIS — T148XXA Other injury of unspecified body region, initial encounter: Secondary | ICD-10-CM

## 2019-05-28 DIAGNOSIS — I739 Peripheral vascular disease, unspecified: Secondary | ICD-10-CM | POA: Diagnosis not present

## 2019-05-28 DIAGNOSIS — I6523 Occlusion and stenosis of bilateral carotid arteries: Secondary | ICD-10-CM | POA: Diagnosis not present

## 2019-05-28 DIAGNOSIS — L97521 Non-pressure chronic ulcer of other part of left foot limited to breakdown of skin: Secondary | ICD-10-CM | POA: Diagnosis not present

## 2019-05-28 MED ORDER — FLUCONAZOLE 150 MG PO TABS: 150.0000 mg | ORAL_TABLET | Freq: Once | ORAL | 0 refills | Status: AC

## 2019-05-28 NOTE — Progress Notes (Signed)
Subjective: 78 year old female presents the office today for follow-up evaluation of blisters to both of her feet.  She feels the wounds are trying to heal.  She has been keeping small amount of Betadine the wounds.  She is in a follow-up with Dr. Alvester Chou in regards to possible endovascular intervention given circulation and ongoing ulcerations. Denies any systemic complaints such as fevers, chills, nausea, vomiting. No acute changes since last appointment, and no other complaints at this time.   Objective: AAO x3, NAD There is skin breakdown present to the distal aspects of the digits although improving.  No surrounding erythema, ascending cellulitis.  No fluctuation crepitation.  Small wound is still present lateral aspect the left fourth toe on the PIPJ with fibrotic tissue.  There is no edema, erythema, increased warmth there is no fluctuation crepitation. No other open lesions are identified. No pain with calf compression, swelling, warmth, erythema.   Assessment: Blisters bilaterally with left fourth toe wound; PAD  Plan: -All treatment options discussed with the patient including all alternatives, risks, complications.  -Plan to continue Betadine wet-to-dry dressing changes on toes daily.  They are healing although slowly.  Recommend small amount of medihoney ointment on the left fourth toe. -Hopefully increase circulation will help the wound healing.  She is going to be seeing Dr. Gwenlyn Found for this. -Monitor for any clinical signs or symptoms of infection and directed to call the office immediately should any occur or go to the ER.  Return in about 1 week (around 06/04/2019).  Trula Slade DPM

## 2019-05-29 ENCOUNTER — Encounter: Payer: Self-pay | Admitting: Cardiovascular Disease

## 2019-06-02 ENCOUNTER — Other Ambulatory Visit: Payer: Self-pay

## 2019-06-02 ENCOUNTER — Encounter: Payer: Self-pay | Admitting: Cardiovascular Disease

## 2019-06-02 ENCOUNTER — Ambulatory Visit (INDEPENDENT_AMBULATORY_CARE_PROVIDER_SITE_OTHER): Payer: Medicare Other | Admitting: Cardiovascular Disease

## 2019-06-02 VITALS — BP 117/53 | HR 90 | Temp 97.7°F | Ht 64.5 in | Wt 147.2 lb

## 2019-06-02 DIAGNOSIS — E785 Hyperlipidemia, unspecified: Secondary | ICD-10-CM

## 2019-06-02 DIAGNOSIS — Z01812 Encounter for preprocedural laboratory examination: Secondary | ICD-10-CM

## 2019-06-02 DIAGNOSIS — I10 Essential (primary) hypertension: Secondary | ICD-10-CM

## 2019-06-02 DIAGNOSIS — I6523 Occlusion and stenosis of bilateral carotid arteries: Secondary | ICD-10-CM

## 2019-06-02 DIAGNOSIS — I739 Peripheral vascular disease, unspecified: Secondary | ICD-10-CM

## 2019-06-02 NOTE — Assessment & Plan Note (Signed)
History of hyperlipidemia on statin therapy with lipid profile performed 05/22/2018 revealing total cholesterol 147, LDL 69 HDL 33.

## 2019-06-02 NOTE — Patient Instructions (Signed)
[place] [address] Dept: [phone] Loc: [place]  06/02/2019  You are scheduled for a Peripheral Angiogram on Monday, September 21 with Dr. [name].  1. Please arrive at the [facility] (Main Entrance A) at [facility]: [address] [place], [place] [address] at 5:30 AM (This time is two hours before your procedure to ensure your preparation). Free valet parking service is available.   Special note: Every effort is made to have your procedure done on time. Please understand that emergencies sometimes delay scheduled procedures.  2. Diet: Do not eat solid foods after midnight.  The patient may have clear liquids until 5am upon the day of the procedure.  3. Labs:   You will need to have blood drawn TODAY (If you are unable to have blood drawn today, you may present for lab work any time between today and June 11, 2019 for lab work) Go to:  Retail buyer at [employer] [id number], [place], [place] [address] FOR YOUR BASIC METABOLIC PANEL, [place], [place] HORMONE LAB WORK. NO APPOINTMENT IS NEEDED. YOU MUST HAVE THIS LAB WORK DONE BEFORE GOING TO GET YOUR COVID-19 TEST DONE BECAUSE YOU WILL NEED TO SELF-ISOLATE AFTER THE COVID-19 TEST UNTIL THE DAY OF YOUR [place].  You will need to have a COVID-19 test done on June 11, 2019. Your appointment is at 11:00am. Go to: [facility] Entrance [place],  [id number] FOR YOUR COVID-19 TEST. YOU MUST HAVE YOUR COVID-19 TEST COMPLETED 4 DAYS PRIOR TO YOUR UPCOMING PROCEDURE/TEST. YOU WILL ALSO NEED TO SELF-ISOLATE AFTER THE COVID-19 TEST UNTIL THE DAY OF YOUR PROCEDURE/TEST. PLEASE BRING YOUR I.D. AND YOUR INSURANCE CARD(S) WITH YOU.   4. Medication instructions in preparation for your procedure:   Do not take glipizide (Gluocotrol) on the morning of your procedure  Do not take Invokana on the morning of your procedure  Do not take Ozempic on the morning of your procedure  Do not take glucophage (Metformin) on the day of the procedure and HOLD 48 HOURS AFTER THE PROCEDURE.  Do not take hydrochlorothiazide on the morning of the procedure  On the morning of your procedure, take your Aspirin and any morning medicines NOT listed above.  You may use sips of water.  5. Plan for one night stay--bring personal belongings. 6. Bring a current list of your medications and current insurance cards. 7. You MUST have a responsible person to drive you home. 8. Someone MUST be with you the first 24 hours after you arrive home or your discharge will be delayed. 9. Please wear clothes that are easy to get on and off and wear slip-on shoes.  Thank you for allowing [place] to care for you!   -- [facility]  _______________________________________________________________________ Post-Procedure Testing: Your physician has requested that you have an aorta/iliac duplex. During this test, an ultrasound is used to evaluate blood flow to the aorta and iliac arteries. Allow one hour for this exam. Do not eat after midnight the day before and avoid carbonated beverages. TO BE SCHEDULE FOR AFTER YOUR PROCEDURE  Your physician has requested that you have an ankle brachial index (ABI). During this test an ultrasound and blood pressure cuff are used to evaluate the arteries that supply the arms and legs with blood. Allow thirty minutes for this exam.  There are no restrictions or special instructions. TO BE SCHEDULE FOR AFTER YOUR PROCEDURE  Recommended Testing: Your physician has requested that you have a carotid duplex. This test is an ultrasound of the carotid arteries in your neck. It looks at blood flow through these arteries that supply the brain with blood. Allow one hour for  this exam. There are no restrictions or special instructions. TO BE SCHEDULED FOR November 2020   Follow-Up: At [facility], you and your health needs are our priority.  As part of our continuing mission to provide you with exceptional heart care, we have created designated Provider Care Teams.  These Care Teams include your primary Cardiologist (physician) and Advanced Practice Providers (APPs -  Physician Assistants and Nurse Practitioners) who all work together to provide you with the care you need, when you need it. You will need a follow up appointment AFTER YOUR PROCEDURE. PLEASE MAKE SURE TO HAVE YOUR ULTRASOUNDS FOR YOUR LEGS COMPLETED BEFORE THIS APPOINTMENT.

## 2019-06-02 NOTE — H&P (View-Only) (Signed)
06/02/2019 [name]   28-Aug-1941  [id number]  Primary Physician Maurice Small, MD Primary Cardiologist: [name] MD [name], [place]  HPI:  [name] is a 78 y.o.    mildly overweight widowed Caucasian female mother of 55, grandmother 7 grandchildren referred by Dr. Jacqualyn Posey, her podiatrist for critical limb ischemia.I last saw her in the office  05/05/2019 she does have a history of treated hypertension, diabetes and hyperlipidemia. She is never had a heart attack or stroke. She denies chest pain or shortness of breath. 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 She has some local trauma related to socks over Christmas and has developed ischemic appearing wounds on her right second and third toes with recent Dopplers performed 10/09/2018 revealing a right ABI 0.75, left 2.79 with moderately severe right SFA stenosis and an occluded right posterior tibial artery. She is seeing Dr. Jacqualyn Posey back regularly for aggressive local wound care.   Her wounds continue to heal on her right and left second toes.  These sores are probably a result of local trauma.  Recent ABIs are in the 0.6  range with a high-grade right SFA stenosis and an occluded left popliteal with tibial vessel disease as well.  She does complain of some claudication.  She wishes to proceed with endovascular therapy for wound healing and lifestyle limiting claudication.  Current Meds  Medication Sig  . Ascorbic Acid (VITAMIN C) 1000 MG tablet Take 1,000 mg by mouth daily.  Marland Kitchen aspirin 81 MG tablet Take 81 mg by mouth daily.  Marland Kitchen atorvastatin (LIPITOR) 20 MG tablet Take 1 tablet by mouth once daily  . CINNAMON PO Take 1,000 mg by mouth 2 (two) times daily.   Marland Kitchen diltiazem (CARTIA XT) 240 MG 24 hr capsule Take 1 capsule (240 mg total) by mouth daily.  . fish oil-omega-3 fatty acids 1000 MG capsule Take 1 g by mouth daily. TAKE 1 TABLET BY MOUTH 4 TIMES WEEKLY.  Marland Kitchen glipiZIDE (GLUCOTROL XL) 5 MG 24 hr tablet TAKE  1 TABLET BY MOUTH ONCE DAILY WITH BREAKFAST  . hydrochlorothiazide (HYDRODIURIL) 25 MG tablet Take 1 tablet (25 mg total) by mouth daily.  . INVOKANA 100 MG TABS tablet TAKE 1 TABLET BY MOUTH BEFORE BREAKFAST  . Magnesium 500 MG TABS Take 0.5 tablets by mouth daily.   . metFORMIN (GLUCOPHAGE) 1000 MG tablet TAKE 1 TABLET BY MOUTH TWICE DAILY WITH A MEAL  . metoprolol (LOPRESSOR) 100 MG tablet Take half tab in the morning and a whole tab in the evening. (Patient taking differently: Take 100 mg by mouth 2 (two) times daily. TAKE 1 TABLET TWICE A DAY)  . omeprazole (PRILOSEC) 20 MG capsule Take 20 mg by mouth 2 (two) times a week.   Marland Kitchen OZEMPIC, 0.25 OR 0.5 MG/DOSE, 2 MG/1.5ML SOPN INJECT 0.5MG  INTO THE SKIN ONCE A WEEK  . telmisartan (MICARDIS) 80 MG tablet Take 1 tablet (80 mg total) by mouth daily.  . vitamin B-12 (CYANOCOBALAMIN) 250 MCG tablet Take 1,000 mcg by mouth daily.     Allergies  Allergen Reactions  . Demerol [Meperidine]   . Scopolamine     Social History   Socioeconomic History  . Marital status: Widowed    Spouse name: Not on file  . Number of children: Not on file  . Years of education: Not on file  . Highest education level: Not on file  Occupational History  . Not on file  Social Needs  . Financial  resource strain: Not on file  . Food insecurity    Worry: Not on file    Inability: Not on file  . Transportation needs    Medical: Not on file    Non-medical: Not on file  Tobacco Use  . Smoking status: Former Smoker    Quit date: 03/31/1972    Years since quitting: 47.2  . Smokeless tobacco: Never Used  Substance and Sexual Activity  . Alcohol use: No  . Drug use: No  . Sexual activity: Not on file  Lifestyle  . Physical activity    Days per week: Not on file    Minutes per session: Not on file  . Stress: Not on file  Relationships  . Social Herbalist on phone: Not on file    Gets together: Not on file    Attends religious service: Not on file     Active member of club or organization: Not on file    Attends meetings of clubs or organizations: Not on file    Relationship status: Not on file  . Intimate partner violence    Fear of current or ex partner: Not on file    Emotionally abused: Not on file    Physically abused: Not on file    Forced sexual activity: Not on file  Other Topics Concern  . Not on file  Social History Narrative  . Not on file     Review of Systems: General: negative for chills, fever, night sweats or weight changes.  Cardiovascular: negative for chest pain, dyspnea on exertion, edema, orthopnea, palpitations, paroxysmal nocturnal dyspnea or shortness of breath Dermatological: negative for rash Respiratory: negative for cough or wheezing Urologic: negative for hematuria Abdominal: negative for nausea, vomiting, diarrhea, bright red blood per rectum, melena, or hematemesis Neurologic: negative for visual changes, syncope, or dizziness All other systems reviewed and are otherwise negative except as noted above.    Blood pressure (!) 117/53, pulse 90, temperature 97.7 F (36.5 C), height 5' 4.5" (1.638 m), weight 147 lb 3.2 oz (66.8 kg), SpO2 98 %.  General appearance: alert and no distress Neck: no adenopathy, no carotid bruit, no JVD, supple, symmetrical, trachea midline and thyroid not enlarged, symmetric, no tenderness/mass/nodules Lungs: clear to auscultation bilaterally Heart: regular rate and rhythm, S1, S2 normal, no murmur, click, rub or gallop Extremities: extremities normal, atraumatic, no cyanosis or edema Pulses: Diminished pedal pulses bilaterally Skin: Nonhealing ulcer left fourth toe Neurologic: Alert and oriented X 3, normal strength and tone. Normal symmetric reflexes. Normal coordination and gait  EKG sinus rhythm at 90 with nonspecific IVCD and occasional PVCs.  I personally reviewed this EKG.  ASSESSMENT AND PLAN:   Essential hypertension History of essential hypertension with  blood pressure measured today at 117/53.  She is on diltiazem and hydrochlorothiazide.  Dyslipidemia History of hyperlipidemia on statin therapy with lipid profile performed 05/22/2018 revealing total cholesterol 147, LDL 69 HDL 33.  Carotid artery disease History of carotid artery disease with duplex performed 07/29/2018 revealing moderate right ICA stenosis.  We will repeat this on annual basis  PAD (peripheral artery disease) History of PAD with a poorly healing right fourth toe ulcer and Dopplers performed 10/09/2018 revealing a right ABI 0.75 and a left 2.79 with high-frequency signals in the proximal right SFA and occluded left popliteal.  She wishes to proceed with endovascular therapy for wound healing on the left and for treatment of lifestyle limiting claudication.      Roderic Palau  Adora Fridge MD FACP,FACC,FAHA, [facility] 06/02/2019 3:01 PM

## 2019-06-02 NOTE — Assessment & Plan Note (Signed)
History of carotid artery disease with duplex performed 07/29/2018 revealing moderate right ICA stenosis.  We will repeat this on annual basis

## 2019-06-02 NOTE — Assessment & Plan Note (Signed)
History of essential hypertension with blood pressure measured today at 117/53.  She is on diltiazem and hydrochlorothiazide.

## 2019-06-02 NOTE — Assessment & Plan Note (Signed)
History of PAD with a poorly healing right fourth toe ulcer and Dopplers performed 10/09/2018 revealing a right ABI 0.75 and a left 2.79 with high-frequency signals in the proximal right SFA and occluded left popliteal.  She wishes to proceed with endovascular therapy for wound healing on the left and for treatment of lifestyle limiting claudication.

## 2019-06-02 NOTE — Progress Notes (Signed)
06/02/2019 [name]   05-02-1941  [id number]  Primary Physician Maurice Small, MD Primary Cardiologist: [name] MD [name], [place]  HPI:  [name] is a 78 y.o.    mildly overweight widowed Caucasian female mother of 50, grandmother 7 grandchildren referred by Dr. Jacqualyn Posey, her podiatrist for critical limb ischemia.I last saw her in the office  05/05/2019 she does have a history of treated hypertension, diabetes and hyperlipidemia. She is never had a heart attack or stroke. She denies chest pain or shortness of breath. 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 She has some local trauma related to socks over Christmas and has developed ischemic appearing wounds on her right second and third toes with recent Dopplers performed 10/09/2018 revealing a right ABI 0.75, left 2.79 with moderately severe right SFA stenosis and an occluded right posterior tibial artery. She is seeing Dr. Jacqualyn Posey back regularly for aggressive local wound care.   Her wounds continue to heal on her right and left second toes.  These sores are probably a result of local trauma.  Recent ABIs are in the 0.6  range with a high-grade right SFA stenosis and an occluded left popliteal with tibial vessel disease as well.  She does complain of some claudication.  She wishes to proceed with endovascular therapy for wound healing and lifestyle limiting claudication.  Current Meds  Medication Sig  . Ascorbic Acid (VITAMIN C) 1000 MG tablet Take 1,000 mg by mouth daily.  Marland Kitchen aspirin 81 MG tablet Take 81 mg by mouth daily.  Marland Kitchen atorvastatin (LIPITOR) 20 MG tablet Take 1 tablet by mouth once daily  . CINNAMON PO Take 1,000 mg by mouth 2 (two) times daily.   Marland Kitchen diltiazem (CARTIA XT) 240 MG 24 hr capsule Take 1 capsule (240 mg total) by mouth daily.  . fish oil-omega-3 fatty acids 1000 MG capsule Take 1 g by mouth daily. TAKE 1 TABLET BY MOUTH 4 TIMES WEEKLY.  Marland Kitchen glipiZIDE (GLUCOTROL XL) 5 MG 24 hr tablet TAKE  1 TABLET BY MOUTH ONCE DAILY WITH BREAKFAST  . hydrochlorothiazide (HYDRODIURIL) 25 MG tablet Take 1 tablet (25 mg total) by mouth daily.  . INVOKANA 100 MG TABS tablet TAKE 1 TABLET BY MOUTH BEFORE BREAKFAST  . Magnesium 500 MG TABS Take 0.5 tablets by mouth daily.   . metFORMIN (GLUCOPHAGE) 1000 MG tablet TAKE 1 TABLET BY MOUTH TWICE DAILY WITH A MEAL  . metoprolol (LOPRESSOR) 100 MG tablet Take half tab in the morning and a whole tab in the evening. (Patient taking differently: Take 100 mg by mouth 2 (two) times daily. TAKE 1 TABLET TWICE A DAY)  . omeprazole (PRILOSEC) 20 MG capsule Take 20 mg by mouth 2 (two) times a week.   Marland Kitchen OZEMPIC, 0.25 OR 0.5 MG/DOSE, 2 MG/1.5ML SOPN INJECT 0.5MG  INTO THE SKIN ONCE A WEEK  . telmisartan (MICARDIS) 80 MG tablet Take 1 tablet (80 mg total) by mouth daily.  . vitamin B-12 (CYANOCOBALAMIN) 250 MCG tablet Take 1,000 mcg by mouth daily.     Allergies  Allergen Reactions  . Demerol [Meperidine]   . Scopolamine     Social History   Socioeconomic History  . Marital status: Widowed    Spouse name: Not on file  . Number of children: Not on file  . Years of education: Not on file  . Highest education level: Not on file  Occupational History  . Not on file  Social Needs  . Financial  resource strain: Not on file  . Food insecurity    Worry: Not on file    Inability: Not on file  . Transportation needs    Medical: Not on file    Non-medical: Not on file  Tobacco Use  . Smoking status: Former Smoker    Quit date: 03/31/1972    Years since quitting: 47.2  . Smokeless tobacco: Never Used  Substance and Sexual Activity  . Alcohol use: No  . Drug use: No  . Sexual activity: Not on file  Lifestyle  . Physical activity    Days per week: Not on file    Minutes per session: Not on file  . Stress: Not on file  Relationships  . Social Herbalist on phone: Not on file    Gets together: Not on file    Attends religious service: Not on file     Active member of club or organization: Not on file    Attends meetings of clubs or organizations: Not on file    Relationship status: Not on file  . Intimate partner violence    Fear of current or ex partner: Not on file    Emotionally abused: Not on file    Physically abused: Not on file    Forced sexual activity: Not on file  Other Topics Concern  . Not on file  Social History Narrative  . Not on file     Review of Systems: General: negative for chills, fever, night sweats or weight changes.  Cardiovascular: negative for chest pain, dyspnea on exertion, edema, orthopnea, palpitations, paroxysmal nocturnal dyspnea or shortness of breath Dermatological: negative for rash Respiratory: negative for cough or wheezing Urologic: negative for hematuria Abdominal: negative for nausea, vomiting, diarrhea, bright red blood per rectum, melena, or hematemesis Neurologic: negative for visual changes, syncope, or dizziness All other systems reviewed and are otherwise negative except as noted above.    Blood pressure (!) 117/53, pulse 90, temperature 97.7 F (36.5 C), height 5' 4.5" (1.638 m), weight 147 lb 3.2 oz (66.8 kg), SpO2 98 %.  General appearance: alert and no distress Neck: no adenopathy, no carotid bruit, no JVD, supple, symmetrical, trachea midline and thyroid not enlarged, symmetric, no tenderness/mass/nodules Lungs: clear to auscultation bilaterally Heart: regular rate and rhythm, S1, S2 normal, no murmur, click, rub or gallop Extremities: extremities normal, atraumatic, no cyanosis or edema Pulses: Diminished pedal pulses bilaterally Skin: Nonhealing ulcer left fourth toe Neurologic: Alert and oriented X 3, normal strength and tone. Normal symmetric reflexes. Normal coordination and gait  EKG sinus rhythm at 90 with nonspecific IVCD and occasional PVCs.  I personally reviewed this EKG.  ASSESSMENT AND PLAN:   Essential hypertension History of essential hypertension with  blood pressure measured today at 117/53.  She is on diltiazem and hydrochlorothiazide.  Dyslipidemia History of hyperlipidemia on statin therapy with lipid profile performed 05/22/2018 revealing total cholesterol 147, LDL 69 HDL 33.  Carotid artery disease History of carotid artery disease with duplex performed 07/29/2018 revealing moderate right ICA stenosis.  We will repeat this on annual basis  PAD (peripheral artery disease) History of PAD with a poorly healing right fourth toe ulcer and Dopplers performed 10/09/2018 revealing a right ABI 0.75 and a left 2.79 with high-frequency signals in the proximal right SFA and occluded left popliteal.  She wishes to proceed with endovascular therapy for wound healing on the left and for treatment of lifestyle limiting claudication.      Roderic Palau  Adora Fridge MD FACP,FACC,FAHA, [facility] 06/02/2019 3:01 PM

## 2019-06-04 ENCOUNTER — Encounter: Payer: Self-pay | Admitting: Podiatry

## 2019-06-04 ENCOUNTER — Ambulatory Visit (INDEPENDENT_AMBULATORY_CARE_PROVIDER_SITE_OTHER): Payer: Medicare Other | Admitting: Podiatry

## 2019-06-04 ENCOUNTER — Other Ambulatory Visit: Payer: Self-pay

## 2019-06-04 DIAGNOSIS — I6523 Occlusion and stenosis of bilateral carotid arteries: Secondary | ICD-10-CM | POA: Diagnosis not present

## 2019-06-04 DIAGNOSIS — I739 Peripheral vascular disease, unspecified: Secondary | ICD-10-CM

## 2019-06-04 DIAGNOSIS — T148XXA Other injury of unspecified body region, initial encounter: Secondary | ICD-10-CM

## 2019-06-04 DIAGNOSIS — L97521 Non-pressure chronic ulcer of other part of left foot limited to breakdown of skin: Secondary | ICD-10-CM | POA: Diagnosis not present

## 2019-06-04 MED ORDER — CEPHALEXIN 500 MG PO CAPS: 500.0000 mg | ORAL_CAPSULE | Freq: Three times a day (TID) | ORAL | 0 refills | Status: DC

## 2019-06-09 DIAGNOSIS — Z01812 Encounter for preprocedural laboratory examination: Secondary | ICD-10-CM | POA: Diagnosis not present

## 2019-06-09 DIAGNOSIS — I10 Essential (primary) hypertension: Secondary | ICD-10-CM | POA: Diagnosis not present

## 2019-06-09 LAB — CBC
Hematocrit: 38.6 % (ref 34.0–46.6)
Hemoglobin: 12.8 g/dL (ref 11.1–15.9)
MCH: 30.4 pg (ref 26.6–33.0)
MCHC: 33.2 g/dL (ref 31.5–35.7)
MCV: 92 fL (ref 79–97)
Platelets: 374 10*3/uL (ref 150–450)
RBC: 4.21 x10E6/uL (ref 3.77–5.28)
RDW: 11.9 % (ref 11.7–15.4)
WBC: 14.7 10*3/uL — ABNORMAL HIGH (ref 3.4–10.8)

## 2019-06-09 LAB — BASIC METABOLIC PANEL
BUN/Creatinine Ratio: 23 (ref 12–28)
BUN: 19 mg/dL (ref 8–27)
CO2: 26 mmol/L (ref 20–29)
Calcium: 10.9 mg/dL — ABNORMAL HIGH (ref 8.7–10.3)
Chloride: 97 mmol/L (ref 96–106)
Creatinine, Ser: 0.84 mg/dL (ref 0.57–1.00)
GFR calc Af Amer: 78 mL/min/{1.73_m2} (ref >59)
GFR calc non Af Amer: 67 mL/min/{1.73_m2} (ref >59)
Glucose: 191 mg/dL — ABNORMAL HIGH (ref 65–99)
Potassium: 5.1 mmol/L (ref 3.5–5.2)
Sodium: 138 mmol/L (ref 134–144)

## 2019-06-09 LAB — TSH: TSH: 1.61 u[IU]/mL (ref 0.450–4.500)

## 2019-06-11 ENCOUNTER — Other Ambulatory Visit (HOSPITAL_COMMUNITY)
Admission: RE | Admit: 2019-06-11 | Discharge: 2019-06-11 | Disposition: A | Discharge status: Home / Self Care | Payer: Medicare Other | Source: Ambulatory Visit | Attending: Cardiovascular Disease | Admitting: Cardiovascular Disease

## 2019-06-11 DIAGNOSIS — Z20828 Contact with and (suspected) exposure to other viral communicable diseases: Secondary | ICD-10-CM | POA: Insufficient documentation

## 2019-06-11 DIAGNOSIS — Z01812 Encounter for preprocedural laboratory examination: Secondary | ICD-10-CM | POA: Insufficient documentation

## 2019-06-12 ENCOUNTER — Telehealth: Payer: Self-pay | Admitting: *Deleted

## 2019-06-12 LAB — NOVEL CORONAVIRUS, NAA (HOSP ORDER, SEND-OUT TO REF LAB; TAT 18-24 HRS): SARS-CoV-2, NAA: NOT DETECTED

## 2019-06-12 NOTE — Telephone Encounter (Signed)
Pt contacted pre-catheterization scheduled at [facility] for: Abdominal aortogram/peripheral angiogram with Dr. Gwenlyn Found on 06/15/2019. Verified arrival time and place: [facility]) at: 5:30 am.   No solid food after midnight prior to cath, clear liquids until 5 AM day of procedure.  Contrast allergy: None  Diabetes medications: glipizide, invokana, and metformin. Patient will hold all three of these medications the day of the procedure. Patient advised she can resume glipizide and invokana after the procedure as she normally would, but to hold metformin for 48 hours post-procedure. Patient confirmed that she takes ozempic once weekly on Friday's and she will continue taking this medication as prescribed.   Patient advised to hold hydrochlorothiazide the day of the procedure until after it is completed.   AM meds can be  taken pre-cath with sip of water including: ASA 81 mg   Confirmed patient has responsible person to drive home post procedure and observe 24 hours after arriving home: yes  Currently, due to Covid-19 pandemic, only one support person will be allowed with patient. Must be the same support person for that patient's entire stay, will be screened and required to wear a mask. They will be asked to wait in the waiting room for the duration of the patient's stay.  Patients are required to wear a mask when they enter the hospital.       COVID-19 Pre-Screening Questions:  . In the past 7 to 10 days have you had a cough,  shortness of breath, headache, congestion, fever (100 or greater) body aches, chills, sore throat, or sudden loss of taste or sense of smell? No . Have you been around anyone with known Covid 19? No . Have you been around anyone who is awaiting Covid 19 test results in the past 7 to 10 days? No . Have you been around anyone who has been exposed to Covid 19, or has mentioned symptoms of Covid 19 within the past 7 to 10 days? No

## 2019-06-14 ENCOUNTER — Other Ambulatory Visit: Payer: Self-pay

## 2019-06-14 DIAGNOSIS — I739 Peripheral vascular disease, unspecified: Secondary | ICD-10-CM

## 2019-06-15 ENCOUNTER — Ambulatory Visit (HOSPITAL_COMMUNITY)
Admission: RE | Admit: 2019-06-15 | Discharge: 2019-06-15 | Disposition: A | Discharge status: Home / Self Care | Payer: Medicare Other | Attending: Cardiovascular Disease | Admitting: Cardiovascular Disease

## 2019-06-15 ENCOUNTER — Other Ambulatory Visit: Payer: Self-pay

## 2019-06-15 ENCOUNTER — Encounter (HOSPITAL_COMMUNITY)
Admission: RE | Disposition: A | Discharge status: Home / Self Care | Payer: Medicare Other | Source: Home / Self Care | Attending: Cardiovascular Disease

## 2019-06-15 DIAGNOSIS — Z7984 Long term (current) use of oral hypoglycemic drugs: Secondary | ICD-10-CM | POA: Insufficient documentation

## 2019-06-15 DIAGNOSIS — Z7982 Long term (current) use of aspirin: Secondary | ICD-10-CM | POA: Insufficient documentation

## 2019-06-15 DIAGNOSIS — I10 Essential (primary) hypertension: Secondary | ICD-10-CM | POA: Diagnosis not present

## 2019-06-15 DIAGNOSIS — I998 Other disorder of circulatory system: Secondary | ICD-10-CM | POA: Diagnosis not present

## 2019-06-15 DIAGNOSIS — Z885 Allergy status to narcotic agent status: Secondary | ICD-10-CM | POA: Diagnosis not present

## 2019-06-15 DIAGNOSIS — Z87891 Personal history of nicotine dependence: Secondary | ICD-10-CM | POA: Insufficient documentation

## 2019-06-15 DIAGNOSIS — Z79899 Other long term (current) drug therapy: Secondary | ICD-10-CM | POA: Diagnosis not present

## 2019-06-15 DIAGNOSIS — I2510 Atherosclerotic heart disease of native coronary artery without angina pectoris: Secondary | ICD-10-CM | POA: Insufficient documentation

## 2019-06-15 DIAGNOSIS — E11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: Secondary | ICD-10-CM | POA: Diagnosis not present

## 2019-06-15 DIAGNOSIS — E785 Hyperlipidemia, unspecified: Secondary | ICD-10-CM | POA: Insufficient documentation

## 2019-06-15 DIAGNOSIS — E1142 Type 2 diabetes mellitus with diabetic polyneuropathy: Secondary | ICD-10-CM | POA: Diagnosis not present

## 2019-06-15 DIAGNOSIS — I70213 Atherosclerosis of native arteries of extremities with intermittent claudication, bilateral legs: Secondary | ICD-10-CM | POA: Diagnosis not present

## 2019-06-15 DIAGNOSIS — I739 Peripheral vascular disease, unspecified: Secondary | ICD-10-CM

## 2019-06-15 HISTORY — PX: PERIPHERAL VASCULAR BALLOON ANGIOPLASTY: CATH118281

## 2019-06-15 HISTORY — PX: LOWER EXTREMITY ANGIOGRAPHY: CATH118251

## 2019-06-15 HISTORY — PX: ABDOMINAL AORTOGRAM: CATH118222

## 2019-06-15 LAB — GLUCOSE, CAPILLARY
Glucose-Capillary: 110 mg/dL — ABNORMAL HIGH (ref 70–99)
Glucose-Capillary: 118 mg/dL — ABNORMAL HIGH (ref 70–99)
Glucose-Capillary: 92 mg/dL (ref 70–99)

## 2019-06-15 LAB — POCT ACTIVATED CLOTTING TIME
Activated Clotting Time: 263 seconds
Activated Clotting Time: 235 seconds
Activated Clotting Time: 268 seconds
Activated Clotting Time: 175 seconds
Activated Clotting Time: 197 seconds

## 2019-06-15 SURGERY — ABDOMINAL AORTOGRAM
Anesthesia: LOCAL

## 2019-06-15 MED ORDER — IODIXANOL 320 MG/ML IV SOLN
INTRAVENOUS | Status: DC | PRN
  Administered 2019-06-15: 09:00:00 160 mL

## 2019-06-15 MED ORDER — ASPIRIN EC 81 MG PO TBEC: 81.0000 mg | DELAYED_RELEASE_TABLET | Freq: Every day | ORAL | Status: DC

## 2019-06-15 MED ORDER — MIDAZOLAM HCL 2 MG/2ML IJ SOLN
INTRAMUSCULAR | Status: AC
  Filled 2019-06-15: qty 2

## 2019-06-15 MED ORDER — SODIUM CHLORIDE 0.9 % IV SOLN: 250.0000 mL | INTRAVENOUS | Status: DC | PRN

## 2019-06-15 MED ORDER — HEPARIN (PORCINE) IN NACL 1000-0.9 UT/500ML-% IV SOLN
INTRAVENOUS | Status: AC
  Filled 2019-06-15: qty 1000

## 2019-06-15 MED ORDER — SODIUM CHLORIDE 0.9% FLUSH: 3.0000 mL | Freq: Two times a day (BID) | INTRAVENOUS | Status: DC

## 2019-06-15 MED ORDER — LIDOCAINE HCL (PF) 1 % IJ SOLN
INTRAMUSCULAR | Status: DC | PRN
  Administered 2019-06-15: 20 mL

## 2019-06-15 MED ORDER — SODIUM CHLORIDE 0.9% FLUSH: 3.0000 mL | INTRAVENOUS | Status: DC | PRN

## 2019-06-15 MED ORDER — ASPIRIN 81 MG PO CHEW: 81.0000 mg | CHEWABLE_TABLET | ORAL | Status: AC

## 2019-06-15 MED ORDER — MORPHINE SULFATE (PF) 2 MG/ML IV SOLN: 2.0000 mg | INTRAVENOUS | Status: DC | PRN

## 2019-06-15 MED ORDER — HYDRALAZINE HCL 20 MG/ML IJ SOLN: 5.0000 mg | INTRAMUSCULAR | Status: DC | PRN

## 2019-06-15 MED ORDER — HEPARIN SODIUM (PORCINE) 1000 UNIT/ML IJ SOLN
INTRAMUSCULAR | Status: AC
  Filled 2019-06-15: qty 1

## 2019-06-15 MED ORDER — LABETALOL HCL 5 MG/ML IV SOLN: 10.0000 mg | INTRAVENOUS | Status: DC | PRN

## 2019-06-15 MED ORDER — SODIUM CHLORIDE 0.9 % WEIGHT BASED INFUSION
3.0000 mL/kg/h | INTRAVENOUS | Status: AC
  Administered 2019-06-15: 3 mL/kg/h via INTRAVENOUS

## 2019-06-15 MED ORDER — ONDANSETRON HCL 4 MG/2ML IJ SOLN: 4.0000 mg | Freq: Four times a day (QID) | INTRAMUSCULAR | Status: DC | PRN

## 2019-06-15 MED ORDER — HEPARIN (PORCINE) IN NACL 1000-0.9 UT/500ML-% IV SOLN
INTRAVENOUS | Status: DC | PRN
  Administered 2019-06-15 (×2): 500 mL

## 2019-06-15 MED ORDER — FENTANYL CITRATE (PF) 100 MCG/2ML IJ SOLN
INTRAMUSCULAR | Status: DC | PRN
  Administered 2019-06-15 (×2): 25 ug via INTRAVENOUS

## 2019-06-15 MED ORDER — SODIUM CHLORIDE 0.9 % IV SOLN: INTRAVENOUS | Status: AC

## 2019-06-15 MED ORDER — HEPARIN SODIUM (PORCINE) 1000 UNIT/ML IJ SOLN
INTRAMUSCULAR | Status: DC | PRN
  Administered 2019-06-15: 7000 [IU] via INTRAVENOUS
  Administered 2019-06-15: 2500 [IU] via INTRAVENOUS

## 2019-06-15 MED ORDER — ACETAMINOPHEN 325 MG PO TABS: 650.0000 mg | ORAL_TABLET | ORAL | Status: DC | PRN

## 2019-06-15 MED ORDER — MIDAZOLAM HCL 2 MG/2ML IJ SOLN
INTRAMUSCULAR | Status: DC | PRN
  Administered 2019-06-15: 1 mg via INTRAVENOUS

## 2019-06-15 MED ORDER — FENTANYL CITRATE (PF) 100 MCG/2ML IJ SOLN
INTRAMUSCULAR | Status: AC
  Filled 2019-06-15: qty 2

## 2019-06-15 MED ORDER — SODIUM CHLORIDE 0.9 % WEIGHT BASED INFUSION: 1.0000 mL/kg/h | INTRAVENOUS | Status: DC

## 2019-06-15 MED ORDER — LIDOCAINE HCL (PF) 1 % IJ SOLN
INTRAMUSCULAR | Status: AC
  Filled 2019-06-15: qty 30

## 2019-06-15 SURGICAL SUPPLY — 25 items
CATH ANGIO 5F PIGTAIL 65CM (CATHETERS) ×1 IMPLANT
CATH CROSS OVER TEMPO 5F (CATHETERS) ×1 IMPLANT
CATH NAVICROSS ANGLED 135CM (MICROCATHETER) ×1 IMPLANT
CATH VIANCE CROSS STAND 150CM (MICROCATHETER) ×4
CATH VIANCE CROSS STD 150CM (MICROCATHETER) IMPLANT
DEVICE CONTINUOUS FLUSH (MISCELLANEOUS) ×1 IMPLANT
DEVICE TORQUE .014-.018 (MISCELLANEOUS) IMPLANT
GLIDEWIRE ANGLED SS 035X260CM (WIRE) ×1 IMPLANT
GUIDEWIRE ANGLED .035X150CM (WIRE) ×1 IMPLANT
KIT PV (KITS) ×4 IMPLANT
SHEATH HIGHFLEX ANSEL 6FRX55 (SHEATH) ×1 IMPLANT
SHEATH PINNACLE 5F 10CM (SHEATH) ×1 IMPLANT
SHEATH PINNACLE 6F 10CM (SHEATH) ×1 IMPLANT
SHEATH PROBE COVER 6X72 (BAG) ×1 IMPLANT
STOPCOCK MORSE 400PSI 3WAY (MISCELLANEOUS) ×1 IMPLANT
SYR MEDRAD MARK 7 150ML (SYRINGE) ×4 IMPLANT
TAPE VIPERTRACK RADIOPAQ (MISCELLANEOUS) IMPLANT
TAPE VIPERTRACK RADIOPAQUE (MISCELLANEOUS) ×1
TORQUE DEVICE .014-.018 (MISCELLANEOUS) ×4
TRANSDUCER W/STOPCOCK (MISCELLANEOUS) ×4 IMPLANT
TRAY PV CATH (CUSTOM PROCEDURE TRAY) ×4 IMPLANT
TUBING CIL FLEX 10 FLL-RA (TUBING) ×1 IMPLANT
WIRE HITORQ VERSACORE ST 145CM (WIRE) ×1 IMPLANT
WIRE ROSEN-J .035X260CM (WIRE) ×1 IMPLANT
WIRE SPARTACORE .014X300CM (WIRE) ×1 IMPLANT

## 2019-06-15 NOTE — Discharge Instructions (Signed)
Femoral Site Care °This sheet gives you information about how to care for yourself after your procedure. Your health care provider may also give you more specific instructions. If you have problems or questions, contact your health care provider. °What can I expect after the procedure? °After the procedure, it is common to have: °· Bruising that usually fades within 1-2 weeks. °· Tenderness at the site. °Follow these instructions at home: °Wound care °· Follow instructions from your health care provider about how to take care of your insertion site. Make sure you: °? Wash your hands with soap and water before you change your bandage (dressing). If soap and water are not available, use hand sanitizer. °? Change your dressing as told by your health care provider. °? Leave stitches (sutures), skin glue, or adhesive strips in place. These skin closures may need to stay in place for 2 weeks or longer. If adhesive strip edges start to loosen and curl up, you may trim the loose edges. Do not remove adhesive strips completely unless your health care provider tells you to do that. °· Do not take baths, swim, or use a hot tub until your health care provider approves. °· You may shower 24-48 hours after the procedure or as told by your health care provider. °? Gently wash the site with plain soap and water. °? Pat the area dry with a clean towel. °? Do not rub the site. This may cause bleeding. °· Do not apply powder or lotion to the site. Keep the site clean and dry. °· Check your femoral site every day for signs of infection. Check for: °? Redness, swelling, or pain. °? Fluid or blood. °? Warmth. °? Pus or a bad smell. °Activity °· For the first 2-3 days after your procedure, or as long as directed: °? Avoid climbing stairs as much as possible. °? Do not squat. °· Do not lift anything that is heavier than 10 lb (4.5 kg), or the limit that you are told, until your health care provider says that it is safe. °· Rest as  directed. °? Avoid sitting for a long time without moving. Get up to take short walks every 1-2 hours. °· Do not drive for 24 hours if you were given a medicine to help you relax (sedative). °General instructions °· Take over-the-counter and prescription medicines only as told by your health care provider. °· Keep all follow-up visits as told by your health care provider. This is important. °Contact a health care provider if you have: °· A fever or chills. °· You have redness, swelling, or pain around your insertion site. °Get help right away if: °· The catheter insertion area swells very fast. °· You pass out. °· You suddenly start to sweat or your skin gets clammy. °· The catheter insertion area is bleeding, and the bleeding does not stop when you hold steady pressure on the area. °· The area near or just beyond the catheter insertion site becomes pale, cool, tingly, or numb. °These symptoms may represent a serious problem that is an emergency. Do not wait to see if the symptoms will go away. Get medical help right away. Call your local emergency services (911 in the [place] Do not drive yourself to the hospital. °Summary °· After the procedure, it is common to have bruising that usually fades within 1-2 weeks. °· Check your femoral site every day for signs of infection. °· Do not lift anything that is heavier than 10 lb (4.5 kg), or the   limit that you are told, until your health care provider says that it is safe. °This information is not intended to replace advice given to you by your health care provider. Make sure you discuss any questions you have with your health care provider. °Document Released: 05/14/2014 Document Revised: 09/23/2017 Document Reviewed: 09/23/2017 °Elsevier Patient Education © 2020 [employer] ° °

## 2019-06-15 NOTE — Progress Notes (Signed)
Subjective: 78 year old female presents the office today for follow-up evaluation of blisters to both of her feet.  She has been keeping the areas clean and dry.  She denies any drainage or pus.  She did follow-up with Dr. Alvester Chou and she is scheduled for angiogram. Denies any systemic complaints such as fevers, chills, nausea, vomiting. No acute changes since last appointment, and no other complaints at this time.   Objective: AAO x3, NAD There is skin breakdown present to the distal aspects of the digits. Eschar has formed in the distal right hallux.  Superficial wound at the distal aspect of the distal second toe on the right foot as well as the distal 1, 2, 3 of the left foot.  Ulceration left fourth toe with fibrotic base.  There is no fluctuation or crepitation.  No significant edema.  There is no pain with calf compression, swelling, warmth, erythema.            Assessment: Bilateral digital ulcerations, PAD  Plan: -All treatment options discussed with the patient including all alternatives, risks, complications.  -Continue with daily dressing changes.  Hopefully we can improve circulation in order to get the wounds to heal uneventfully. -Plan to continue antibiotic ointment dressing changes on toes daily.  Continue with medihoney on the left 4th toe. She is going to be seeing Dr. Gwenlyn Found for this. -Monitor for any clinical signs or symptoms of infection and directed to call the office immediately should any occur or go to the ER.  Return in about 12 days (around 06/16/2019).  Trula Slade DPM

## 2019-06-15 NOTE — Interval H&P Note (Signed)
History and Physical Interval Note:  06/15/2019 7:26 AM  [name]  has presented today for surgery, with the diagnosis of PAD.  The various methods of treatment have been discussed with the patient and family. After consideration of risks, benefits and other options for treatment, the patient has consented to  Procedure(s): ABDOMINAL AORTOGRAM W/LOWER EXTREMITY (Bilateral) as a surgical intervention.  The patient's history has been reviewed, patient examined, no change in status, stable for surgery.  I have reviewed the patient's chart and labs.  Questions were answered to the patient's satisfaction.     [name]

## 2019-06-15 NOTE — Progress Notes (Signed)
Up and walked and tolerated well; right groin stable, no bleeding or hematoma 

## 2019-06-15 NOTE — Progress Notes (Signed)
Site area: RT GROIN Site Prior to Removal:  Level 0 Pressure Applied For:22 MINUTES Manual:   YES Patient Status During Pull:  AWAKE Post Pull Site:  Level 0 Post Pull Instructions Given:  YES Post Pull Pulses Present: DOPPLE Dressing Applied:  YES Bedrest begins @ 12:20 Comments:

## 2019-06-16 ENCOUNTER — Encounter (HOSPITAL_COMMUNITY): Payer: Self-pay | Admitting: Cardiovascular Disease

## 2019-06-17 ENCOUNTER — Ambulatory Visit: Payer: Medicare Other | Admitting: Podiatry

## 2019-06-18 ENCOUNTER — Other Ambulatory Visit: Payer: Self-pay

## 2019-06-18 ENCOUNTER — Ambulatory Visit (INDEPENDENT_AMBULATORY_CARE_PROVIDER_SITE_OTHER): Payer: Medicare Other | Admitting: Podiatry

## 2019-06-18 DIAGNOSIS — L97511 Non-pressure chronic ulcer of other part of right foot limited to breakdown of skin: Secondary | ICD-10-CM | POA: Diagnosis not present

## 2019-06-18 DIAGNOSIS — L97521 Non-pressure chronic ulcer of other part of left foot limited to breakdown of skin: Secondary | ICD-10-CM | POA: Diagnosis not present

## 2019-06-18 DIAGNOSIS — I6523 Occlusion and stenosis of bilateral carotid arteries: Secondary | ICD-10-CM | POA: Diagnosis not present

## 2019-06-18 DIAGNOSIS — T148XXA Other injury of unspecified body region, initial encounter: Secondary | ICD-10-CM | POA: Diagnosis not present

## 2019-06-18 NOTE — Progress Notes (Signed)
Subjective: 78 year old female presents the office today for follow-up evaluation of blisters/ulcerations to both of her feet which started due to trama to the toes.  She has been keeping Betadine on the blister area as well as meta honey wounds.  Overall she states that she is doing the same.  She denies any drainage or pus or any pain. Denies any systemic complaints such as fevers, chills, nausea, vomiting. No acute changes since last appointment, and no other complaints at this time.   Had angio with Dr. Gwenlyn Found 06/15/2019 Angiographic Data:   1: Abdominal aorta- renal arteries widely patent.  The infrarenal abdominal aorta was patent as well with fluoroscopic calcification 2: Left lower extremity- there was an 80% fairly focal proximal left SFA stenosis.  The mid left SFA was diffusely 70% segmentally stenosed and calcified.  There was a short segment calcified CTO distal left SFA and above-knee popliteal artery reconstituting with two-vessel runoff.  The posterior tibial was occluded. 3: Right lower extremity- 90 to 95% calcified exophytic distal right common femoral artery stenosis, 95% fairly focal exophytic calcified mid right SFA stenosis followed by an 80% segmental stenosis, one-vessel runoff via the peroneal  IMPRESSION: [name] has bilateral SFA disease as well as tibial vessel disease with critical limb ischemia.  She has both claudication and nonhealing toe wounds.  We will proceed with attempt at distal left SFA CTO intervention.   Objective: AAO x3, NAD Pulses decreased which is unchanged. Eschar is present distal aspect of the right hallux as well as the right second toe.  Superficial wounds present distal aspect of the left second third toe closed with fibrotic wound present on the left fourth toe.  Chronic erythema present to the toe but mostly circulation no pus or infection.  There is no erythema.  Otherwise no ascending cellulitis.  No drainage or pus.  No fluctuation  crepitation        Assessment: Bilateral digital ulcerations, PAD  Plan: -All treatment options discussed with the patient including all alternatives, risks, complications.  -Unfortunately his dose was not successful.  Discussed possible bypass.  They are concerned they will risk of amputation.  Discussed with Dr. Alvester Chou as well referred to the wound care center for possible hyperbaric oxygen therapy. She has had blisters that have healed previously but this time they are not healing and she has limb ischemia. We had a long discussion today in regards to her treatment.    Return in about 1 week (around 06/25/2019).  Trula Slade DPM

## 2019-06-19 ENCOUNTER — Telehealth: Payer: Self-pay | Admitting: *Deleted

## 2019-06-19 NOTE — Telephone Encounter (Signed)
Faxed required form, clinicals and demographics [facility] 

## 2019-06-19 NOTE — Telephone Encounter (Signed)
-----   Message from Trula Slade, DPM sent at 06/18/2019  2:12 PM EDT ----- Can you please put in for wound care consult? Thanks.

## 2019-06-24 ENCOUNTER — Other Ambulatory Visit: Payer: Self-pay | Admitting: Internal Medicine

## 2019-06-24 ENCOUNTER — Other Ambulatory Visit: Payer: Self-pay | Admitting: Endocrinology

## 2019-06-25 ENCOUNTER — Other Ambulatory Visit: Payer: Self-pay

## 2019-06-25 ENCOUNTER — Ambulatory Visit (INDEPENDENT_AMBULATORY_CARE_PROVIDER_SITE_OTHER): Payer: Medicare Other | Admitting: Podiatry

## 2019-06-25 DIAGNOSIS — I6523 Occlusion and stenosis of bilateral carotid arteries: Secondary | ICD-10-CM | POA: Diagnosis not present

## 2019-06-25 DIAGNOSIS — I739 Peripheral vascular disease, unspecified: Secondary | ICD-10-CM | POA: Diagnosis not present

## 2019-06-25 DIAGNOSIS — I96 Gangrene, not elsewhere classified: Secondary | ICD-10-CM

## 2019-06-25 DIAGNOSIS — T148XXA Other injury of unspecified body region, initial encounter: Secondary | ICD-10-CM

## 2019-06-29 ENCOUNTER — Telehealth: Payer: Self-pay | Admitting: *Deleted

## 2019-06-29 ENCOUNTER — Other Ambulatory Visit (INDEPENDENT_AMBULATORY_CARE_PROVIDER_SITE_OTHER): Payer: Medicare Other

## 2019-06-29 ENCOUNTER — Other Ambulatory Visit: Payer: Self-pay

## 2019-06-29 DIAGNOSIS — E1165 Type 2 diabetes mellitus with hyperglycemia: Secondary | ICD-10-CM

## 2019-06-29 DIAGNOSIS — I96 Gangrene, not elsewhere classified: Secondary | ICD-10-CM

## 2019-06-29 DIAGNOSIS — T148XXA Other injury of unspecified body region, initial encounter: Secondary | ICD-10-CM

## 2019-06-29 DIAGNOSIS — I739 Peripheral vascular disease, unspecified: Secondary | ICD-10-CM

## 2019-06-29 LAB — COMPREHENSIVE METABOLIC PANEL
ALT: 12 U/L (ref 0–35)
AST: 12 U/L (ref 0–37)
Albumin: 4 g/dL (ref 3.5–5.2)
Alkaline Phosphatase: 57 U/L (ref 39–117)
BUN: 19 mg/dL (ref 6–23)
CO2: 30 mEq/L (ref 19–32)
Calcium: 10.7 mg/dL — ABNORMAL HIGH (ref 8.4–10.5)
Chloride: 100 mEq/L (ref 96–112)
Creatinine, Ser: 0.92 mg/dL (ref 0.40–1.20)
GFR: 59.15 mL/min — ABNORMAL LOW (ref >60.00)
Glucose, Bld: 139 mg/dL — ABNORMAL HIGH (ref 70–99)
Potassium: 4.8 mEq/L (ref 3.5–5.1)
Sodium: 140 mEq/L (ref 135–145)
Total Bilirubin: 0.4 mg/dL (ref 0.2–1.2)
Total Protein: 7 g/dL (ref 6.0–8.3)

## 2019-06-29 LAB — LIPID PANEL
Cholesterol: 118 mg/dL (ref 0–200)
HDL: 33.4 mg/dL — ABNORMAL LOW (ref >39.00)
LDL Cholesterol: 53 mg/dL (ref 0–99)
NonHDL: 84.92
Total CHOL/HDL Ratio: 4
Triglycerides: 158 mg/dL — ABNORMAL HIGH (ref 0.0–149.0)
VLDL: 31.6 mg/dL (ref 0.0–40.0)

## 2019-06-29 LAB — HEMOGLOBIN A1C: Hgb A1c MFr Bld: 7.1 % — ABNORMAL HIGH (ref 4.6–6.5)

## 2019-06-29 LAB — MICROALBUMIN / CREATININE URINE RATIO
Creatinine,U: 92.4 mg/dL
Microalb Creat Ratio: 2.2 mg/g (ref 0.0–30.0)
Microalb, Ur: 2 mg/dL — ABNORMAL HIGH (ref 0.0–1.9)

## 2019-06-29 NOTE — Telephone Encounter (Signed)
Faxed referral, clinicals and demographics to [facility] as Urgent.

## 2019-06-29 NOTE — Progress Notes (Addendum)
Subjective: 78 year old female presents the office today for follow-up evaluation of blisters/ulcerations to both of her feet which started due to trama to the toes.  She states that her right big toenail did come off when she was cleaning her feet.  She is been keeping Betadine on the wounds.  Left side seems to be doing better.  Her left big toe is healed and second third toes with callused over.  She is more concerned about the right foot today denies any drainage or pus.  No pain. Denies any systemic complaints such as fevers, chills, nausea, vomiting. No acute changes since last appointment, and no other complaints at this time.   Had angio with Dr. Gwenlyn Found 06/15/2019 Angiographic Data:   1: Abdominal aorta- renal arteries widely patent.  The infrarenal abdominal aorta was patent as well with fluoroscopic calcification 2: Left lower extremity- there was an 80% fairly focal proximal left SFA stenosis.  The mid left SFA was diffusely 70% segmentally stenosed and calcified.  There was a short segment calcified CTO distal left SFA and above-knee popliteal artery reconstituting with two-vessel runoff.  The posterior tibial was occluded. 3: Right lower extremity- 90 to 95% calcified exophytic distal right common femoral artery stenosis, 95% fairly focal exophytic calcified mid right SFA stenosis followed by an 80% segmental stenosis, one-vessel runoff via the peroneal  IMPRESSION: Ms. Bo has bilateral SFA disease as well as tibial vessel disease with critical limb ischemia.  She has both claudication and nonhealing toe wounds.  We will proceed with attempt at distal left SFA CTO intervention.  Objective: AAO x3, NAD Pulses decreased which is unchanged. Eschar is present distal aspect of the right hallux and the toenail is no longer present.  There is hyperpigmentation of the nail bed with some fibrotic tissue.  Scab present distal aspect the second toe as well.  No drainage or pus. On the left  foot is hyperkeratotic tissue the distal aspect of the second and third toe.  The hallux is healed.  There is still a chronic wound on the left fourth toe as well with fibrotic wound base.  No probing to bone, undermining or tunneling. No pain with calf compression, swelling, warmth, erythema.  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  -She is previously seen Dr. Alvester Chou.  Unfortunately not able to increase circulation.  Very concerned about the digits.  I referred to the wound care center but she has not yet received an appointment.  I will follow-up on this.  Also refer to vascular surgery for possible bypass.  Discussed with Dr. Alvester Chou she may be a candidate for femoral-popliteal bypass graft.   Return in about 1 week (around 07/02/2019).  Trula Slade DPM

## 2019-06-29 NOTE — Telephone Encounter (Signed)
I spoke with [place], I asked if the referral had been faxed to their office if they would contact the pt, even if the referral was not in the Epic. Destiny stated they would place the referral whether in Epic or not. I refaxed the referral to [facility] as Urgent.

## 2019-06-29 NOTE — Telephone Encounter (Signed)
-----   Message from Trula Slade, DPM sent at 06/29/2019  7:58 AM EDT ----- We had previously referred her to the wound care center.  She has not heard from them.  Can you please follow-up on this.  Also can you please put a referral in for vascular surgery to be seen ASAP?  Thank you

## 2019-06-30 ENCOUNTER — Ambulatory Visit (HOSPITAL_COMMUNITY)
Admission: RE | Admit: 2019-06-30 | Discharge: 2019-06-30 | Disposition: A | Discharge status: Home / Self Care | Payer: Medicare Other | Source: Ambulatory Visit | Attending: Cardiovascular Disease | Admitting: Cardiovascular Disease

## 2019-06-30 ENCOUNTER — Encounter (HOSPITAL_COMMUNITY): Payer: Self-pay

## 2019-06-30 ENCOUNTER — Ambulatory Visit (HOSPITAL_BASED_OUTPATIENT_CLINIC_OR_DEPARTMENT_OTHER)
Admission: RE | Admit: 2019-06-30 | Discharge: 2019-06-30 | Disposition: A | Discharge status: Home / Self Care | Payer: Medicare Other | Source: Ambulatory Visit | Attending: Cardiovascular Disease | Admitting: Cardiovascular Disease

## 2019-06-30 DIAGNOSIS — I739 Peripheral vascular disease, unspecified: Secondary | ICD-10-CM | POA: Diagnosis not present

## 2019-07-02 ENCOUNTER — Ambulatory Visit (INDEPENDENT_AMBULATORY_CARE_PROVIDER_SITE_OTHER): Payer: Medicare Other | Admitting: Endocrinology

## 2019-07-02 ENCOUNTER — Encounter: Payer: Self-pay | Admitting: Endocrinology

## 2019-07-02 ENCOUNTER — Ambulatory Visit (INDEPENDENT_AMBULATORY_CARE_PROVIDER_SITE_OTHER): Payer: Medicare Other | Admitting: Podiatry

## 2019-07-02 ENCOUNTER — Telehealth: Payer: Self-pay | Admitting: *Deleted

## 2019-07-02 ENCOUNTER — Other Ambulatory Visit: Payer: Self-pay

## 2019-07-02 VITALS — BP 110/50 | HR 96 | Ht 64.0 in | Wt 142.4 lb

## 2019-07-02 DIAGNOSIS — I96 Gangrene, not elsewhere classified: Secondary | ICD-10-CM

## 2019-07-02 DIAGNOSIS — I6523 Occlusion and stenosis of bilateral carotid arteries: Secondary | ICD-10-CM | POA: Diagnosis not present

## 2019-07-02 DIAGNOSIS — I739 Peripheral vascular disease, unspecified: Secondary | ICD-10-CM

## 2019-07-02 DIAGNOSIS — I10 Essential (primary) hypertension: Secondary | ICD-10-CM

## 2019-07-02 DIAGNOSIS — E782 Mixed hyperlipidemia: Secondary | ICD-10-CM

## 2019-07-02 DIAGNOSIS — E1165 Type 2 diabetes mellitus with hyperglycemia: Secondary | ICD-10-CM | POA: Diagnosis not present

## 2019-07-02 DIAGNOSIS — Z23 Encounter for immunization: Secondary | ICD-10-CM | POA: Diagnosis not present

## 2019-07-02 DIAGNOSIS — E1159 Type 2 diabetes mellitus with other circulatory complications: Secondary | ICD-10-CM

## 2019-07-02 LAB — GLUCOSE, POCT (MANUAL RESULT ENTRY): POC Glucose: 122 mg/dl — AB (ref 70–99)

## 2019-07-02 MED ORDER — DOXYCYCLINE HYCLATE 100 MG PO TABS: 100.0000 mg | ORAL_TABLET | Freq: Two times a day (BID) | ORAL | 0 refills | Status: DC

## 2019-07-02 MED ORDER — NITROGLYCERIN 0.2 MG/HR TD PT24: 0.2000 mg | MEDICATED_PATCH | Freq: Every day | TRANSDERMAL | 12 refills | Status: DC

## 2019-07-02 NOTE — Telephone Encounter (Signed)
[facility] found the referral. I asked Christine to contact pt she, I told Altha Harm that pt was expecting her call.

## 2019-07-02 NOTE — Progress Notes (Signed)
Patient ID: [name], female   DOB: April 08, 1941, 78 y.o.   MRN: [id number]          Reason for Appointment: Follow-up for endocrinology problems  Referring physician: Darcus Austin   History of Present Illness:          Date of diagnosis of type 2 diabetes mellitus: 1984       Background history:   She has been on metformin since about the time of her diagnosis and has been on other drugs also but has not had many changes in her regimen in the last several years She thinks her blood sugar used to be fairly well controlled At some point she was given Byetta for about a year and a half which improved her control but this was subsequently start because of it being too expensive for her.  She had no side effects with this  Recent history:   Her A1c is now slightly higher at 7.1  Non-insulin hypoglycemic drugs: Glipizide ER 5 mg daily, Invokana 300 mg daily, Ozempic 0.5 mg weekly, metformin 1 g twice daily  Current management, blood sugar patterns and problems identified:  She has not checked her blood sugars much at home lately  Unable to explain why her A1c is slightly higher, blood sugars by recall and in the lab appear to be fairly good and unchanged  She tends to have mildly increased fasting readings, this was 139 in the lab but she did not think they are any higher at home  She has lost weight this year and is continuing on Ozempic without nausea  Overall trying to watch portions and avoid high carbohydrate meals  Recently has not been able to do any exercise or walking with recent foot problems  Not clear if she has high postprandial readings, checking frequently, highest reading reportedly 190  Fasting blood sugars at home are usually 120-140        Side effects from medications have been: None  Compliance with the medical regimen: Good Hypoglycemia:   None  Glucose monitoring:  done only 0.5 times a day         Glucometer: One Touch.       Blood Glucose  readings by time of day as above   Previous readings:  PRE-MEAL Fasting Lunch Dinner Bedtime Overall  Glucose range:  132-212     119-233  Mean/median:  162     166   POST-MEAL PC Breakfast PC Lunch PC Dinner  Glucose range:   131-181   Mean/median:   163        Self-care: The diet that the patient has been following is: tries to limit sweets and fried food.     Meal times are:  Breakfast is at 8:30 AM lunch: 1 PM, dinner: 6:30 PM  Typical meal intake: Breakfast is oatmeal with fruit Lunch is usually soup with crackers, dinner is usually a low-fat meat with vegetables Snacks will be celery and ranch dressing               Dietician visit, most recent: 10/18               Exercise: not Walking 10 minutes, 2-3 times a day  Weight history:  Wt Readings from Last 3 Encounters:  07/02/19 142 lb 6.4 oz (64.6 kg)  06/15/19 145 lb (65.8 kg)  06/02/19 147 lb 3.2 oz (66.8 kg)    Glycemic control:   Lab Results  Component Value Date  HGBA1C 7.1 (H) 06/29/2019   HGBA1C 6.7 (A) 03/30/2019   HGBA1C 7.7 (A) 10/23/2018   Lab Results  Component Value Date   MICROALBUR 2.0 (H) 06/29/2019   LDLCALC 53 06/29/2019   CREATININE 0.92 06/29/2019   Lab Results  Component Value Date   MICRALBCREAT 2.2 06/29/2019    Lab Results  Component Value Date   FRUCTOSAMINE 243 03/18/2019   FRUCTOSAMINE 268 10/21/2018   FRUCTOSAMINE 267 05/30/2018      Allergies as of 07/02/2019      Reactions   Demerol [meperidine]    Delusional    Scopolamine    Delusional       Medication List       Accurate as of July 02, 2019 11:50 AM. If you have any questions, ask your nurse or doctor.        STOP taking these medications   cephALEXin 500 MG capsule Commonly known as: Keflex Stopped by: Elayne Snare, MD     TAKE these medications   acetaminophen 500 MG tablet Commonly known as: TYLENOL Take 1,000 mg by mouth every 6 (six) hours as needed for moderate pain or headache.    aspirin 81 MG tablet Take 81 mg by mouth every evening.   atorvastatin 20 MG tablet Commonly known as: LIPITOR Take 1 tablet by mouth once daily   CINNAMON PO Take 1,000 mg by mouth 3 (three) times daily with meals.   diltiazem 240 MG 24 hr capsule Commonly known as: Cartia XT Take 1 capsule (240 mg total) by mouth daily.   fish oil-omega-3 fatty acids 1000 MG capsule Take 1 g by mouth 4 (four) times a week. Sun, Mon, Wed, Fri at night   glipiZIDE 5 MG 24 hr tablet Commonly known as: GLUCOTROL XL TAKE 1 TABLET BY MOUTH ONCE DAILY WITH BREAKFAST What changed: See the new instructions.   hydrochlorothiazide 25 MG tablet Commonly known as: HYDRODIURIL Take 1 tablet (25 mg total) by mouth daily.   Invokana 100 MG Tabs tablet Generic drug: canagliflozin TAKE 1 TABLET BY MOUTH BEFORE BREAKFAST   Magnesium 250 MG Tabs Take 250 mg by mouth daily with supper.   metFORMIN 1000 MG tablet Commonly known as: GLUCOPHAGE TAKE 1 TABLET BY MOUTH TWICE DAILY WITH A MEAL What changed: See the new instructions.   metoprolol tartrate 100 MG tablet Commonly known as: LOPRESSOR Take half tab in the morning and a whole tab in the evening. What changed:   how much to take  how to take this  when to take this  additional instructions   omeprazole 20 MG capsule Commonly known as: PRILOSEC Take 20 mg by mouth 2 (two) times a week. Monday and Friday evenings   Ozempic (0.25 or 0.5 MG/DOSE) 2 MG/1.5ML Sopn Generic drug: Semaglutide(0.25 or 0.5MG /DOS) INJECT 0.5MG  INTO THE SKIN ONCE A WEEK   Probiotic Caps Take 1 capsule by mouth daily with breakfast.   telmisartan 80 MG tablet Commonly known as: MICARDIS Take 1 tablet (80 mg total) by mouth daily. What changed: when to take this   vitamin B-12 1000 MCG tablet Commonly known as: CYANOCOBALAMIN Take 1,000 mcg by mouth daily with supper.   vitamin C 1000 MG tablet Take 1,000 mg by mouth daily with supper.       Allergies:   Allergies  Allergen Reactions  . Demerol [Meperidine]     Delusional   . Scopolamine     Delusional     Past Medical History:  Diagnosis Date  .  Diabetes mellitus without complication ([place])   . Hyperlipidemia   . Hypertension 11/20/11   ECHO- EF>55% Borderline concentric left ventricular hypertrophy. There is a small calcified mass in the L:A near the LA appendage. No valvular masses seen with associated mitral annular calcification. LA Volume/ BSA27.4 ml/m2 No AS. Right ventricular systolic pressure is elevated at 31mmHg.    Past Surgical History:  Procedure Laterality Date  . ABDOMINAL AORTOGRAM N/A 06/15/2019   Procedure: ABDOMINAL AORTOGRAM;  Surgeon: [name], MD;  Location: [facility];  Service: Cardiovascular;  Laterality: N/A;  . ABDOMINAL HYSTERECTOMY    . LOWER EXTREMITY ANGIOGRAPHY Bilateral 06/15/2019   Procedure: Lower Extremity Angiography;  Surgeon: [name], MD;  Location: [facility];  Service: Cardiovascular;  Laterality: Bilateral;  . PERIPHERAL VASCULAR BALLOON ANGIOPLASTY Left 06/15/2019   Procedure: PERIPHERAL VASCULAR BALLOON ANGIOPLASTY;  Surgeon: [name], MD;  Location: [facility];  Service: Cardiovascular;  Laterality: Left;  SFA UNSUCCESSFUL UNABLE TO CROSS LESION    Family History  Problem Relation Age of Onset  . Cancer - Prostate Father   . Cancer - Colon Father   . Stroke Mother   . Hypertension Mother   . Hyperlipidemia Mother   . Melanoma Brother     Social History:  reports that she quit smoking about 47 years ago. She has never used smokeless tobacco. She reports that she does not drink alcohol or use drugs.   Review of Systems   Lipid history: Hypercholesterolemia by PCP with 20 mg Lipitor    Lab Results  Component Value Date   CHOL 118 06/29/2019   HDL 33.40 (L) 06/29/2019   LDLCALC 53 06/29/2019   TRIG 158.0 (H) 06/29/2019   CHOLHDL 4 06/29/2019           Hypertension:  Present for several years, currently treated with  Micardis 80 mg, taking this in the morning   She think blood pressure is 110/50-120 HCTZ was supposed to be reduced to half tablet but she cannot cut this in half and is taking 25 mg Also on 100 mg Invokana with no change in renal function  BP Readings from Last 3 Encounters:  07/02/19 (!) 110/50  06/15/19 (!) 106/46  06/02/19 (!) 117/53   Lab Results  Component Value Date   CREATININE 0.92 06/29/2019   CREATININE 0.84 06/09/2019   CREATININE 0.99 03/18/2019     Most recent eye exam was in 9/19  Most recent foot exam: 04/2018  She has had significant PAD and followed by vascular cardiologist   LABS:  Office Visit on 07/02/2019  Component Date Value Ref Range Status  . POC Glucose 07/02/2019 122* 70 - 99 mg/dl Final  Lab on 06/29/2019  Component Date Value Ref Range Status  . Microalb, Ur 06/29/2019 2.0* 0.0 - 1.9 mg/dL Final  . Creatinine,U 06/29/2019 92.4  mg/dL Final  . Microalb Creat Ratio 06/29/2019 2.2  0.0 - 30.0 mg/g Final  . Cholesterol 06/29/2019 118  0 - 200 mg/dL Final   ATP III Classification       Desirable:  < 200 mg/dL               Borderline High:  200 - 239 mg/dL          High:  > = 240 mg/dL  . Triglycerides 06/29/2019 158.0* 0.0 - 149.0 mg/dL Final   Normal:  <150 mg/dLBorderline High:  150 - 199 mg/dL  . HDL 06/29/2019  33.40* >39.00 mg/dL Final  . VLDL 06/29/2019 31.6  0.0 - 40.0 mg/dL Final  . LDL Cholesterol 06/29/2019 53  0 - 99 mg/dL Final  . Total CHOL/HDL Ratio 06/29/2019 4   Final                  Men          Women1/2 Average Risk     3.4          3.3Average Risk          5.0          4.42X Average Risk          9.6          7.13X Average Risk          15.0          11.0                      . NonHDL 06/29/2019 84.92   Final   NOTE:  Non-HDL goal should be 30 mg/dL higher than patient's LDL goal (i.e. LDL goal of < 70 mg/dL, would have non-HDL goal of < 100 mg/dL)  . Sodium 06/29/2019 140   135 - 145 mEq/L Final  . Potassium 06/29/2019 4.8  3.5 - 5.1 mEq/L Final  . Chloride 06/29/2019 100  96 - 112 mEq/L Final  . CO2 06/29/2019 30  19 - 32 mEq/L Final  . Glucose, Bld 06/29/2019 139* 70 - 99 mg/dL Final  . BUN 06/29/2019 19  6 - 23 mg/dL Final  . Creatinine, Ser 06/29/2019 0.92  0.40 - 1.20 mg/dL Final  . Total Bilirubin 06/29/2019 0.4  0.2 - 1.2 mg/dL Final  . Alkaline Phosphatase 06/29/2019 57  39 - 117 U/L Final  . AST 06/29/2019 12  0 - 37 U/L Final  . ALT 06/29/2019 12  0 - 35 U/L Final  . Total Protein 06/29/2019 7.0  6.0 - 8.3 g/dL Final  . Albumin 06/29/2019 4.0  3.5 - 5.2 g/dL Final  . Calcium 06/29/2019 10.7* 8.4 - 10.5 mg/dL Final  . GFR 06/29/2019 59.15* >60.00 mL/min Final  . Hgb A1c MFr Bld 06/29/2019 7.1* 4.6 - 6.5 % Final   Glycemic Control Guidelines for People with Diabetes:Non Diabetic:  <6%Goal of Therapy: <7%Additional Action Suggested:  >8%     Physical Examination:  BP (!) 110/50 (BP Location: Left Arm, Patient Position: Sitting, Cuff Size: Normal)   Pulse 96   Ht 5\' 4"  (1.626 m)   Wt 142 lb 6.4 oz (64.6 kg)   SpO2 98%   BMI 24.44 kg/m   No ankle edema present Patient not examined, currently followed by podiatrist and has bandages on    ASSESSMENT:  Diabetes type 2, uncontrolled  See history of present illness for detailed discussion of current diabetes management, blood sugar patterns and problems identified  Her A1c is slightly higher at 7.1 She is on multiple drugs including Ozempic and Invokana Considering her age and multiple problems her level of control is adequate  Her BMI is now 24.4  Although she has lost weight this probably is for multiple reasons Unable to review her home blood sugars since she did not bring her meter and has not been monitoring much Also not able to do any exercise From recall blood sugars are under 200 even after meals and fasting readings are not high Lab glucose 139  Hypertension: Blood pressure is  low normal, is on  multiple drugs No microalbuminuria  Lipids are excellent with LDL 53 on treatment with Lipitor She does have atherosclerosis with peripheral vascular disease  PLAN:    No change in diabetes medications Will watch for any excessive weight loss especially after she has resolution of her recent problems with foot blistering To check more readings after meals  She will reduce her telmisartan in half since her blood pressure is low normal and she does not need aggressive lowering of her blood pressure especially with vascular problems and being on Invokana   Patient Instructions  Telmisartan in 1/2  High-dose influenza vaccine given, patient information supplied  Total visit time for evaluation and management of multiple problems and counseling =25 minutes  Elayne Snare 07/02/2019, 11:50 AM   Note: This office note was prepared with Dragon voice recognition system technology. Any transcriptional errors that result from this process are unintentional.

## 2019-07-02 NOTE — Progress Notes (Addendum)
Subjective: 78 year old female presents the office today for follow-up evaluation of blisters/ulcerations to both of her feet which started due to trama to the toes.  Overall she said that she is doing about the same.  She is keeping small meta Betadine to the areas daily.  She states that on the left fourth toe a scab will form however by the end of the day able to fall off.  Denies any drainage or pus.  She is scheduled to the wound care center next week.  She has not heard from a vascular surgeon yet. Denies any systemic complaints such as fevers, chills, nausea, vomiting. No acute changes since last appointment, and no other complaints at this time.   Had angio with Dr. Gwenlyn Found 06/15/2019 Angiographic Data:   1: Abdominal aorta- renal arteries widely patent.  The infrarenal abdominal aorta was patent as well with fluoroscopic calcification 2: Left lower extremity- there was an 80% fairly focal proximal left SFA stenosis.  The mid left SFA was diffusely 70% segmentally stenosed and calcified.  There was a short segment calcified CTO distal left SFA and above-knee popliteal artery reconstituting with two-vessel runoff.  The posterior tibial was occluded. 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  She has both claudication and nonhealing toe wounds.  We will proceed with attempt at distal left SFA CTO intervention.  Objective: AAO x3, NAD Pulses decreased which is unchanged. Gangrenous changes present to right first and second toe.  Superficial wound on the third toe on the right foot.  On the left foot wound still present on the left fourth toe with a fibrotic wound base but no probing to bone.   Calluses present distal aspect of the second and third digits. No pain with calf compression, swelling, warmth, erythema.  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  -She is previously seen Dr. Alvester Chou.  Unfortunately not able to increase circulation.  Very concerned about the digits and I discussed with her likely amputation.  I referred to the wound care center and she is appointment next week.  We will also follow-up with vascular surgery.  -Nitro patches to be applied behind the toes on the right foot daily.  Discussed side effects.  Monitor.  Return in about 2 weeks (around 07/16/2019).  Trula Slade DPM

## 2019-07-02 NOTE — Telephone Encounter (Signed)
-----   Message from Trula Slade, DPM sent at 07/02/2019  2:42 PM EDT ----- She has not heard from vascular surgery. Can you please follow up on this? It is very important that she is seen soon.

## 2019-07-02 NOTE — Patient Instructions (Signed)
Telmisartan in 1/2

## 2019-07-08 DIAGNOSIS — E11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: Secondary | ICD-10-CM | POA: Diagnosis not present

## 2019-07-08 DIAGNOSIS — I739 Peripheral vascular disease, unspecified: Secondary | ICD-10-CM | POA: Diagnosis not present

## 2019-07-08 DIAGNOSIS — Z7984 Long term (current) use of oral hypoglycemic drugs: Secondary | ICD-10-CM | POA: Diagnosis not present

## 2019-07-08 DIAGNOSIS — E113393 Type 2 diabetes mellitus with moderate nonproliferative diabetic retinopathy without macular edema, bilateral: Secondary | ICD-10-CM | POA: Diagnosis not present

## 2019-07-08 DIAGNOSIS — I10 Essential (primary) hypertension: Secondary | ICD-10-CM | POA: Diagnosis not present

## 2019-07-08 DIAGNOSIS — Z0000 Encounter for general adult medical examination without abnormal findings: Secondary | ICD-10-CM | POA: Diagnosis not present

## 2019-07-13 ENCOUNTER — Other Ambulatory Visit: Payer: Self-pay | Admitting: Podiatry

## 2019-07-13 ENCOUNTER — Encounter (HOSPITAL_BASED_OUTPATIENT_CLINIC_OR_DEPARTMENT_OTHER): Payer: Medicare Other | Attending: Internal Medicine | Admitting: Internal Medicine

## 2019-07-13 ENCOUNTER — Other Ambulatory Visit: Payer: Self-pay

## 2019-07-13 DIAGNOSIS — S90424A Blister (nonthermal), right lesser toe(s), initial encounter: Secondary | ICD-10-CM | POA: Diagnosis not present

## 2019-07-13 DIAGNOSIS — E1152 Type 2 diabetes mellitus with diabetic peripheral angiopathy with gangrene: Secondary | ICD-10-CM | POA: Insufficient documentation

## 2019-07-13 DIAGNOSIS — E11621 Type 2 diabetes mellitus with foot ulcer: Secondary | ICD-10-CM | POA: Insufficient documentation

## 2019-07-13 DIAGNOSIS — S90425A Blister (nonthermal), left lesser toe(s), initial encounter: Secondary | ICD-10-CM | POA: Diagnosis not present

## 2019-07-13 DIAGNOSIS — E11319 Type 2 diabetes mellitus with unspecified diabetic retinopathy without macular edema: Secondary | ICD-10-CM | POA: Diagnosis not present

## 2019-07-13 DIAGNOSIS — I10 Essential (primary) hypertension: Secondary | ICD-10-CM | POA: Diagnosis not present

## 2019-07-13 DIAGNOSIS — Z86718 Personal history of other venous thrombosis and embolism: Secondary | ICD-10-CM | POA: Diagnosis not present

## 2019-07-13 DIAGNOSIS — Z87891 Personal history of nicotine dependence: Secondary | ICD-10-CM | POA: Insufficient documentation

## 2019-07-13 DIAGNOSIS — E119 Type 2 diabetes mellitus without complications: Secondary | ICD-10-CM | POA: Diagnosis not present

## 2019-07-13 DIAGNOSIS — L97518 Non-pressure chronic ulcer of other part of right foot with other specified severity: Secondary | ICD-10-CM | POA: Insufficient documentation

## 2019-07-13 DIAGNOSIS — L97528 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ity: Secondary | ICD-10-CM | POA: Diagnosis not present

## 2019-07-13 DIAGNOSIS — Z7984 Long term (current) use of oral hypoglycemic drugs: Secondary | ICD-10-CM | POA: Diagnosis not present

## 2019-07-13 DIAGNOSIS — E1142 Type 2 diabetes mellitus with diabetic polyneuropathy: Secondary | ICD-10-CM | POA: Diagnosis not present

## 2019-07-13 DIAGNOSIS — S90421A Blister (nonthermal), right great toe, initial encounter: Secondary | ICD-10-CM | POA: Diagnosis not present

## 2019-07-14 ENCOUNTER — Encounter: Payer: Self-pay | Admitting: Physician Assistant

## 2019-07-14 ENCOUNTER — Other Ambulatory Visit: Payer: Self-pay

## 2019-07-14 ENCOUNTER — Ambulatory Visit (INDEPENDENT_AMBULATORY_CARE_PROVIDER_SITE_OTHER): Payer: Medicare Other | Admitting: Physician Assistant

## 2019-07-14 ENCOUNTER — Encounter: Payer: Medicare Other | Admitting: Physician Assistant

## 2019-07-14 ENCOUNTER — Telehealth: Payer: Self-pay

## 2019-07-14 VITALS — BP 123/62 | HR 98 | Temp 97.0°F | Ht 64.5 in | Wt 140.2 lb

## 2019-07-14 DIAGNOSIS — I739 Peripheral vascular disease, unspecified: Secondary | ICD-10-CM | POA: Diagnosis not present

## 2019-07-14 DIAGNOSIS — I10 Essential (primary) hypertension: Secondary | ICD-10-CM

## 2019-07-14 DIAGNOSIS — E119 Type 2 diabetes mellitus without complications: Secondary | ICD-10-CM

## 2019-07-14 DIAGNOSIS — E785 Hyperlipidemia, unspecified: Secondary | ICD-10-CM

## 2019-07-14 DIAGNOSIS — I6529 Occlusion and stenosis of unspecified carotid artery: Secondary | ICD-10-CM

## 2019-07-14 DIAGNOSIS — I998 Other disorder of circulatory system: Secondary | ICD-10-CM | POA: Diagnosis not present

## 2019-07-14 DIAGNOSIS — I70229 Atherosclerosis of native arteries of extremities with rest pain, unspecified extremity: Secondary | ICD-10-CM

## 2019-07-14 DIAGNOSIS — Z01812 Encounter for preprocedural laboratory examination: Secondary | ICD-10-CM | POA: Diagnosis not present

## 2019-07-14 DIAGNOSIS — I6523 Occlusion and stenosis of bilateral carotid arteries: Secondary | ICD-10-CM

## 2019-07-14 NOTE — Telephone Encounter (Signed)

## 2019-07-14 NOTE — Telephone Encounter (Signed)
I called pt and asked if she had been to see [facility]. Pt states she saw them yesterday, but forgot to ask about an antibiotic and her foot looks very red. I told pt since she had seen the Wound Care doctor more recently than Dr. Jacqualyn Posey, they would be best to contact concerning the need for an antibiotic. Pt states understanding and I gave her their [phone].

## 2019-07-14 NOTE — Progress Notes (Signed)
This encounter was created in error - please disregard.

## 2019-07-14 NOTE — Telephone Encounter (Signed)
If she does not get in touch with them then we can refill the antibiotic

## 2019-07-14 NOTE — Progress Notes (Signed)
Cardiology Office Note    Date:  07/16/2019   ID:  [name], [name] 1941-03-16, MRN [id number]  PCP:  [name], MD  Cardiologist:  Dr. Gwenlyn Found  Chief Complaint  Patient presents with  . Follow-up    seen for Dr. Gwenlyn Found, changed from virtual to office for evaluation of critical limb ischemia    History of Present Illness:  [name] is a 78 y.o. female with past medical history of hypertension, hyperlipidemia, diabetes, and PAD.  She has a history of diabetic peripheral neuropathy.  Lower extremity Doppler obtained in January 2020 revealed right ABI 0.75, left ABI 0.79 with moderate severe right SFA stenosis and occluded right posterior tibial artery.  Repeat lower extremity ABI obtained on 04/24/2019 showed a right ABI 0.6, left ABI 0.56, distal right CFA stenosis at 50 to 74%, right proximal SFA stenosis at 50 to 74%.  Patient was last seen by Dr. Gwenlyn Found on 06/02/2019 at which time she was agreeable to proceed with endovascular therapy for wound healing and lifestyle limiting claudication.  She eventually underwent lower extremity angiography on 06/15/2019 which revealed 80% proximal left SFA stenosis, 70% mellitus SFA stenosis, CTO distal SFA and above-knee popliteal artery reconstituting with two-vessel runoff, 90 to 95% distal right common femoral artery stenosis, 95% fairly focal calcified right SFA stenosis followed by 80% segmental stenosis with one-vessel runoff.  Attempt was made at the distal left SFA CTO, however this was unsuccessful.  Postprocedure, her ABI was repeated on 06/30/2019, ABI is unchanged.  Patient presents today for cardiology office evaluation.  On physical exam, her first, second, and third toe appears to be black.  The right first and the second toe completely dry and gangrenous.  While the third toe had appearance of gangrene near the dorsal surface however not on the ventral surface.  There was a very faint dorsalis pedis pulse on the right side.  She  denies any chest pain however noticed that her recent heart rate has been somewhat elevated.  She does have significant pain in the right lower extremity.  I discussed the case with Dr. Gwenlyn Found who has also seen the patient today.  Dr. Gwenlyn Found reviewed her recent lower extremity angiography films and I felt her right lower extremity vascular flow can be improved.  She may still ended up having amputation of the right first and second toe and possibly the third.  However lower extremity angiography likely will prevent further insult to her injury.  We will arrange lower extremity angiography next Monday.   Past Medical History:  Diagnosis Date  . Diabetes mellitus without complication ([place])   . Hyperlipidemia   . Hypertension 11/20/11   ECHO- EF>55% Borderline concentric left ventricular hypertrophy. There is a small calcified mass in the L:A near the LA appendage. No valvular masses seen with associated mitral annular calcification. LA Volume/ BSA27.4 ml/m2 No AS. Right ventricular systolic pressure is elevated at 41mmHg.    Past Surgical History:  Procedure Laterality Date  . ABDOMINAL AORTOGRAM N/A 06/15/2019   Procedure: ABDOMINAL AORTOGRAM;  Surgeon: [name], MD;  Location: [facility];  Service: Cardiovascular;  Laterality: N/A;  . ABDOMINAL HYSTERECTOMY    . LOWER EXTREMITY ANGIOGRAPHY Bilateral 06/15/2019   Procedure: Lower Extremity Angiography;  Surgeon: [name], MD;  Location: [facility];  Service: Cardiovascular;  Laterality: Bilateral;  . PERIPHERAL VASCULAR BALLOON ANGIOPLASTY Left 06/15/2019   Procedure: PERIPHERAL VASCULAR BALLOON ANGIOPLASTY;  Surgeon: [name]  J, MD;  Location: [facility];  Service: Cardiovascular;  Laterality: Left;  SFA UNSUCCESSFUL UNABLE TO CROSS LESION    Current Medications: Outpatient Medications Prior to Visit  Medication Sig Dispense Refill  . acetaminophen (TYLENOL) 500 MG tablet Take 1,000 mg by mouth  every 6 (six) hours as needed for moderate pain or headache.    . Ascorbic Acid (VITAMIN C) 1000 MG tablet Take 1,000 mg by mouth daily with supper.     Marland Kitchen aspirin 81 MG tablet Take 81 mg by mouth every evening.     Marland Kitchen atorvastatin (LIPITOR) 20 MG tablet Take 1 tablet by mouth once daily 90 tablet 0  . CINNAMON PO Take 1,000 mg by mouth 3 (three) times daily with meals.     Marland Kitchen diltiazem (CARTIA XT) 240 MG 24 hr capsule Take 1 capsule (240 mg total) by mouth daily. 90 capsule 3  . fish oil-omega-3 fatty acids 1000 MG capsule Take 1 g by mouth 4 (four) times a week. Sun, Mon, Wed, Fri at night    . glipiZIDE (GLUCOTROL XL) 5 MG 24 hr tablet TAKE 1 TABLET BY MOUTH ONCE DAILY WITH BREAKFAST (Patient taking differently: TAKE 1 TABLET BY MOUTH ONCE DAILY WITH BREAKFAST) 90 tablet 2  . hydrochlorothiazide (HYDRODIURIL) 25 MG tablet Take 1 tablet (25 mg total) by mouth daily. 90 tablet 1  . INVOKANA 100 MG TABS tablet TAKE 1 TABLET BY MOUTH BEFORE BREAKFAST 30 tablet 0  . Magnesium 250 MG TABS Take 250 mg by mouth daily with supper.    . metFORMIN (GLUCOPHAGE) 1000 MG tablet TAKE 1 TABLET BY MOUTH TWICE DAILY WITH A MEAL 180 tablet 0  . metoprolol (LOPRESSOR) 100 MG tablet Take half tab in the morning and a whole tab in the evening. (Patient taking differently: Take 100 mg by mouth 2 (two) times daily. ) 45 tablet 3  . nitroGLYCERIN (NITRO-DUR) 0.2 mg/hr patch Place 1 patch (0.2 mg total) onto the skin daily. Apply behind right toe daily 30 patch 12  . omeprazole (PRILOSEC) 20 MG capsule Take 20 mg by mouth 2 (two) times a week. Monday and Friday evenings    . OZEMPIC, 0.25 OR 0.5 MG/DOSE, 2 MG/1.5ML SOPN INJECT 0.5MG  INTO THE SKIN ONCE A WEEK 2 mL 0  . Probiotic CAPS Take 1 capsule by mouth daily with breakfast.    . telmisartan (MICARDIS) 80 MG tablet Take 1 tablet (80 mg total) by mouth daily. (Patient taking differently: Take 80 mg by mouth every evening. ) 90 tablet 3  . vitamin B-12 (CYANOCOBALAMIN)  1000 MCG tablet Take 1,000 mcg by mouth daily with supper.    . doxycycline (VIBRA-TABS) 100 MG tablet Take 1 tablet (100 mg total) by mouth 2 (two) times daily. (Patient not taking: Reported on 07/14/2019) 20 tablet 0   No facility-administered medications prior to visit.      Allergies:   Demerol [meperidine] and Scopolamine   Social History   Socioeconomic History  . Marital status: Widowed    Spouse name: Not on file  . Number of children: Not on file  . Years of education: Not on file  . Highest education level: Not on file  Occupational History  . Not on file  Social Needs  . Financial resource strain: Not on file  . Food insecurity    Worry: Not on file    Inability: Not on file  . Transportation needs    Medical: Not on file  Non-medical: Not on file  Tobacco Use  . Smoking status: Former Smoker    Quit date: 03/31/1972    Years since quitting: 47.3  . Smokeless tobacco: Never Used  Substance and Sexual Activity  . Alcohol use: No  . Drug use: No  . Sexual activity: Not on file  Lifestyle  . Physical activity    Days per week: Not on file    Minutes per session: Not on file  . Stress: Not on file  Relationships  . Social Herbalist on phone: Not on file    Gets together: Not on file    Attends religious service: Not on file    Active member of club or organization: Not on file    Attends meetings of clubs or organizations: Not on file    Relationship status: Not on file  Other Topics Concern  . Not on file  Social History Narrative  . Not on file     Family History:  The patient's family history includes Cancer - Colon in her father; Cancer - Prostate in her father; Hyperlipidemia in her mother; Hypertension in her mother; Melanoma in her brother; Stroke in her mother.   ROS:   Please see the history of present illness.    ROS All other systems reviewed and are negative.   PHYSICAL EXAM:   VS:  BP 123/62   Pulse 98   Temp (!) 97 F  (36.1 C)   Ht 5' 4.5" (1.638 m)   Wt 140 lb 3.2 oz (63.6 kg)   SpO2 100%   BMI 23.69 kg/m    GEN: Well nourished, well developed, in no acute distress  HEENT: normal  Neck: no JVD, carotid bruits, or masses Cardiac: RRR; no murmurs, rubs, or gallops,no edema  Respiratory:  clear to auscultation bilaterally, normal work of breathing GI: soft, nontender, nondistended, + BS MS: no deformity or atrophy  Skin: warm and dry, no rash Neuro:  Alert and Oriented x 3, Strength and sensation are intact Psych: euthymic mood, full affect  Wt Readings from Last 3 Encounters:  07/14/19 140 lb 3.2 oz (63.6 kg)  07/14/19 140 lb 3.2 oz (63.6 kg)  07/02/19 142 lb 6.4 oz (64.6 kg)      Studies/Labs Reviewed:   EKG:  EKG is ordered today.  The ekg ordered today demonstrates normal sinus rhythm, poor R wave progression anterior leads, occasional PVCs.  Recent Labs: 06/09/2019: Hemoglobin 12.8; Platelets 374; TSH 1.610 06/29/2019: ALT 12; BUN 19; Creatinine, Ser 0.92; Potassium 4.8; Sodium 140   Lipid Panel    Component Value Date/Time   CHOL 118 06/29/2019 0832   CHOL 164 04/06/2013 0909   TRIG 158.0 (H) 06/29/2019 0832   TRIG 187 (H) 04/06/2013 0909   HDL 33.40 (L) 06/29/2019 0832   HDL 40 04/06/2013 0909   CHOLHDL 4 06/29/2019 0832   VLDL 31.6 06/29/2019 0832   LDLCALC 53 06/29/2019 0832   LDLCALC 87 04/06/2013 0909    Additional studies/ records that were reviewed today include:   Lower extremity angiography 06/15/2019 Angiographic Data:   1: Abdominal aorta- renal arteries widely patent.  The infrarenal abdominal aorta was patent as well with fluoroscopic calcification 2: Left lower extremity- there was an 80% fairly focal proximal left SFA stenosis.  The mid left SFA was diffusely 70% segmentally stenosed and calcified.  There was a short segment calcified CTO distal left SFA and above-knee popliteal artery reconstituting with two-vessel runoff.  The posterior  tibial was occluded.  3: Right lower extremity- 90 to 95% calcified exophytic distal right common femoral artery stenosis, 95% fairly focal exophytic calcified mid right SFA stenosis followed by an 80% segmental stenosis, one-vessel runoff via the peroneal   ASSESSMENT:    1. Critical lower limb ischemia   2. Pre-procedure lab exam   3. PAD (peripheral artery disease) ([place])   4. Essential hypertension   5. Hyperlipidemia LDL goal <70   6. Controlled type 2 diabetes mellitus without complication, without long-term current use of insulin (HCC)      PLAN:  In order of problems listed above:  1. 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  Recent lower extremity arteriogram has been reviewed by Dr. Gwenlyn Found, we plan to proceed with lower extremity PCI and potential atherectomy.  This will be arranged for next Monday  -Risk and benefit of the procedure has been explained to the patient, she is agreeable to proceed.  2. PAD: Recent lower extremity arteriogram demonstrated long calcified lesion in the left lower extremity not amenable to PCI, she has been referred to vascular surgery for consideration of bypass surgery.  She still has not received a phone call from vascular surgery yet.  Surprisingly, the gangrene occurred on her right lower extremity instead.  We plan to proceed with another lower extremity angiography with potential atherectomy of the right lower extremity.  At this point, she has severe pain in her foot and likely will lose at least 2 possibly 3 of her toes.  She cannot ambulate for long distance.  I have filled out disability parking placard for her.  3. Hypertension: Blood pressure stable on current therapy  4. Hyperlipidemia: Continue Lipitor.  5. DM2: Managed by primary care provider    Medication Adjustments/Labs and Tests Ordered: Current medicines are reviewed at length with the patient today.  Concerns regarding medicines are outlined above.   Medication changes, Labs and Tests ordered today are listed in the Patient Instructions below. Patient Instructions     [place] [address] Dept: [phone] Loc: [place]  07/14/2019  You are scheduled for a Peripheral Angiogram on Monday, October 26 with Dr. [name].  1. Please arrive at the [facility] (Main Entrance A) at [facility]: [address] [place], [place] [address] at 5:30 AM (This time is two hours before your procedure to ensure your preparation). Free valet parking service is available.   Special note: Every effort is made to have your procedure done on time. Please understand that emergencies sometimes delay scheduled procedures.  2. Diet: Do not eat solid foods after midnight.  The patient may have clear liquids until 5am upon the day of the procedure.  3. Labs: You will need to have blood drawn on  Thursday 07/16/2019 at [place]   4. Medication instructions in preparation for your procedure:   Contrast Allergy: No  Stop taking, Metformin  Sunday, October 25, and 48 hour after procedure   Do not take Diabetes Med Glipizide, Invokana, Ozempic the morning of the procedure.  On the morning of your procedure, take your Aspirin and any morning medicines NOT listed above.  You may use sips of water.  5. Plan for one night stay--bring personal belongings. 6. Bring a current list of your medications and current insurance cards. 7. You MUST have a responsible person  to drive you home. 8. Someone MUST be with you the first 24 hours after you arrive home or your discharge will be delayed. 9. Please wear clothes that are easy to get on and off and wear slip-on shoes.  Thank you for allowing [place] to care for you!   -- [facility]   Medication Instructions:  Your physician recommends  that you continue on your current medications as directed. Please refer to the Current Medication list given to you today.  *If you need a refill on your cardiac medications before your next appointment, please call your pharmacy*  Lab Work: You will need to have labs (blood work) drawn on Thursday 07/16/2019 at 8AM-10AM [place] [address] and the COVID-19 test on 07/16/2019 at 11:20AM [place].  If you have labs (blood work) drawn today and your tests are completely normal, you will receive your results only by: Marland Kitchen MyChart Message (if you have MyChart) OR . A paper copy in the mail If you have any lab test that is abnormal or we need to change your treatment, we will call you to review the results.  Testing/Procedures: Your physician has requested that you have an ankle brachial index (ABI). During this test an ultrasound and blood pressure cuff are used to evaluate the arteries that supply the arms and legs with blood. Allow thirty minutes for this exam. There are no restrictions or special instructions.  Your physician has requested that you have a lower or upper extremity arterial duplex. This test is an ultrasound of the arteries in the legs or arms. It looks at arterial blood flow in the legs and arms. Allow one hour for Lower and Upper Arterial scans. There are no restrictions or special instructions   Please schedule 2 weeks after procedure   Follow-Up: At [facility], you and your health needs are our priority.  As part of our continuing mission to provide you with exceptional heart care, we have created designated Provider Care Teams.  These Care Teams include your primary Cardiologist (physician) and Advanced Practice Providers (APPs -  Physician Assistants and Nurse Practitioners) who all work together to provide you with the care you need, when you need it.  Your next appointment:   Follow in 2-3 weeks  after procedure   The format for your next  appointment:   In Person  Provider:   [name], MD  Other Instructions               Signed, Almyra Deforest, [place]  07/16/2019 12:25 AM    [place], [place],   [address] Phone: [phone]; Fax: [phone]

## 2019-07-14 NOTE — Patient Instructions (Addendum)
[place] [address] Dept: [phone] Loc: [place]  07/14/2019  You are scheduled for a Peripheral Angiogram on Monday, October 26 with Dr. [name].  1. Please arrive at the [facility] (Main Entrance A) at [facility]: [address] [place], [place] [address] at 5:30 AM (This time is two hours before your procedure to ensure your preparation). Free valet parking service is available.   Special note: Every effort is made to have your procedure done on time. Please understand that emergencies sometimes delay scheduled procedures.  2. Diet: Do not eat solid foods after midnight.  The patient may have clear liquids until 5am upon the day of the procedure.  3. Labs: You will need to have blood drawn on  Thursday 07/16/2019 at [place]   4. Medication instructions in preparation for your procedure:   Contrast Allergy: No  Stop taking, Metformin  Sunday, October 25, and 48 hour after procedure   Do not take Diabetes Med Glipizide, Invokana, Ozempic the morning of the procedure.  On the morning of your procedure, take your Aspirin and any morning medicines NOT listed above.  You may use sips of water.  5. Plan for one night stay--bring personal belongings. 6. Bring a current list of your medications and current insurance cards. 7. You MUST have a responsible person to drive you home. 8. Someone MUST be with you the first 24 hours after you arrive home or your discharge will be delayed. 9. Please wear clothes that are easy to get on and off and wear slip-on shoes.  Thank you for allowing [place] to care for you!   -- [place] [facility]   Medication Instructions:  Your physician recommends that you continue on your current medications as directed. Please refer to the Current Medication list given to you  today.  *If you need a refill on your cardiac medications before your next appointment, please call your pharmacy*  Lab Work: You will need to have labs (blood work) drawn on Thursday 07/16/2019 at 8AM-10AM [place] [address] and the COVID-19 test on 07/16/2019 at 11:20AM [place].  If you have labs (blood work) drawn today and your tests are completely normal, you will receive your results only by:  [place] (if you have MyChart) OR  A paper copy in the mail If you have any lab test that is abnormal or we need to change your treatment, we will call you to review the results.  Testing/Procedures: Your physician has requested that you have an ankle brachial index (ABI). During this test an ultrasound and blood pressure cuff are used to evaluate the arteries that supply the arms and legs with blood. Allow thirty minutes for this exam. There are no restrictions or special instructions.  Your physician has requested that you have a lower or upper extremity arterial duplex. This test is an ultrasound of the arteries in the legs or arms. It looks at arterial blood flow in the legs and arms. Allow one hour for Lower and Upper Arterial scans. There are no restrictions or special instructions   Please schedule 2 weeks after procedure   Follow-Up: At [facility], you and your health needs are our priority.  As part of our continuing mission to provide you with exceptional heart care, we have created designated Provider Care Teams.  These Care  Teams include your primary Cardiologist (physician) and Advanced Practice Providers (APPs -  Physician Assistants and Nurse Practitioners) who all work together to provide you with the care you need, when you need it.  Your next appointment:   Follow in 2-3 weeks  after procedure   The format for your next appointment:   In Person  Provider:   [name], MD  Other Instructions

## 2019-07-14 NOTE — Telephone Encounter (Signed)
Can you see if she went to the wound care center and see if they put her on anything?

## 2019-07-15 ENCOUNTER — Telehealth: Payer: Self-pay

## 2019-07-15 NOTE — Telephone Encounter (Signed)
Spoke to pt about a call she got regarding her procedure. Went over her pre-procedure instructions. Pt verbalized understanding.

## 2019-07-15 NOTE — Telephone Encounter (Signed)
Thanks. Yes, that is what I told her as well and I have sent her to Dr. Gwenlyn Found for circulation as well.

## 2019-07-15 NOTE — Telephone Encounter (Signed)
I called pt and she states she was in Dr. Kennon Holter office when the [facility] called, and she knew she would not be able to contact Wound Care, but will contact them today. I asked pt to call me with their decision.

## 2019-07-15 NOTE — Telephone Encounter (Signed)
Pt called states she spoke with [facility] stated Dr. Dellia Nims said she did not need antibiotic, the problem would probably be corrected by correcting her circulation problem, and she is having an angiogram 07/20/2019 by Dr. Gwenlyn Found.

## 2019-07-16 ENCOUNTER — Other Ambulatory Visit (HOSPITAL_COMMUNITY)
Admission: RE | Admit: 2019-07-16 | Discharge: 2019-07-16 | Disposition: A | Discharge status: Home / Self Care | Payer: Medicare Other | Source: Ambulatory Visit | Attending: Cardiovascular Disease | Admitting: Cardiovascular Disease

## 2019-07-16 ENCOUNTER — Telehealth: Payer: Self-pay | Admitting: *Deleted

## 2019-07-16 ENCOUNTER — Ambulatory Visit: Payer: Medicare Other | Admitting: Podiatry

## 2019-07-16 ENCOUNTER — Encounter: Payer: Self-pay | Admitting: Physician Assistant

## 2019-07-16 DIAGNOSIS — Z20828 Contact with and (suspected) exposure to other viral communicable diseases: Secondary | ICD-10-CM | POA: Diagnosis not present

## 2019-07-16 DIAGNOSIS — Z01812 Encounter for preprocedural laboratory examination: Secondary | ICD-10-CM | POA: Insufficient documentation

## 2019-07-16 DIAGNOSIS — I998 Other disorder of circulatory system: Secondary | ICD-10-CM | POA: Diagnosis not present

## 2019-07-16 LAB — CBC
Hematocrit: 35 % (ref 34.0–46.6)
Hemoglobin: 11.8 g/dL (ref 11.1–15.9)
MCH: 29.9 pg (ref 26.6–33.0)
MCHC: 33.7 g/dL (ref 31.5–35.7)
MCV: 89 fL (ref 79–97)
Platelets: 522 10*3/uL — ABNORMAL HIGH (ref 150–450)
RBC: 3.94 x10E6/uL (ref 3.77–5.28)
RDW: 11.8 % (ref 11.7–15.4)
WBC: 15.9 10*3/uL — ABNORMAL HIGH (ref 3.4–10.8)

## 2019-07-16 NOTE — Telephone Encounter (Addendum)
Pt contacted pre-lower extremity angiogram  scheduled at [facility] for: Monday July 20, 2019 7:30 AM Verified arrival time and place: [facility]) at: 5:30 AM   No solid food after midnight prior to cath, clear liquids until 5 AM day of procedure. Contrast allergy: no  Hold: HCTZ-AM of procedure. Metformin-day of procedure and 48 hours post procedure. Glipizide-AM of procedure. Invokana-AM of procedure   Except hold medications AM meds can be  taken pre-cath with sip of water including: ASA 81 mg   Confirmed patient has responsible adult to drive home post procedure and observe 24 hours after arriving home: yes  Currently, due to Covid-19 pandemic, only one support person will be allowed with patient. Must be the same support person for that patient's entire stay, will be screened and required to wear a mask. They will be asked to wait in the waiting room for the duration of the patient's stay.  Patients are required to wear a mask when they enter the hospital.      COVID-19 Pre-Screening Questions:  . In the past 7 to 10 days have you had a cough,  shortness of breath, headache, congestion, fever (100 or greater) body aches, chills, sore throat, or sudden loss of taste or sense of smell? no . Have you been around anyone with known Covid 19? no . Have you been around anyone who is awaiting Covid 19 test results in the past 7 to 10 days? . Have you been around anyone who has been exposed to Covid 19, or has mentioned symptoms of Covid 19 within the past 7 to 10 days? no    I reviewed procedure/mask/visitor, Covid-19 screening questions with patient, she verbalized understanding, thanked me for call.

## 2019-07-17 ENCOUNTER — Telehealth: Payer: Self-pay

## 2019-07-17 ENCOUNTER — Other Ambulatory Visit (INDEPENDENT_AMBULATORY_CARE_PROVIDER_SITE_OTHER): Payer: Medicare Other

## 2019-07-17 DIAGNOSIS — E785 Hyperlipidemia, unspecified: Secondary | ICD-10-CM

## 2019-07-17 DIAGNOSIS — I998 Other disorder of circulatory system: Secondary | ICD-10-CM

## 2019-07-17 DIAGNOSIS — I70229 Atherosclerosis of native arteries of extremities with rest pain, unspecified extremity: Secondary | ICD-10-CM

## 2019-07-17 DIAGNOSIS — I10 Essential (primary) hypertension: Secondary | ICD-10-CM

## 2019-07-17 DIAGNOSIS — I739 Peripheral vascular disease, unspecified: Secondary | ICD-10-CM

## 2019-07-17 DIAGNOSIS — I6529 Occlusion and stenosis of unspecified carotid artery: Secondary | ICD-10-CM

## 2019-07-17 LAB — NOVEL CORONAVIRUS, NAA (HOSP ORDER, SEND-OUT TO REF LAB; TAT 18-24 HRS): SARS-CoV-2, NAA: NOT DETECTED

## 2019-07-17 NOTE — Telephone Encounter (Signed)
Spoke with Dr. Gwenlyn Found regarding pt. He consulted Dr. Trula Slade in Vein and Vascular Surgery and would like pt upcoming PV procedure on 10/26 to be cancelled and will have Dr. Stephens Shire office contact pt to set her up for an appt with him.   Primary nurse contacted pt to update. Dr. Gwenlyn Found spoke with her. He updated pt with current treatment plan.  Primary nurse contacted cath lab and spoke with [place]. She has cancelled PV procedure.

## 2019-07-20 ENCOUNTER — Encounter (HOSPITAL_COMMUNITY): Payer: Self-pay | Admitting: *Deleted

## 2019-07-20 ENCOUNTER — Ambulatory Visit (HOSPITAL_COMMUNITY): Admission: RE | Admit: 2019-07-20 | Payer: Medicare Other | Source: Home / Self Care | Admitting: Cardiovascular Disease

## 2019-07-20 ENCOUNTER — Encounter: Payer: Self-pay | Admitting: *Deleted

## 2019-07-20 ENCOUNTER — Other Ambulatory Visit: Payer: Self-pay | Admitting: *Deleted

## 2019-07-20 ENCOUNTER — Other Ambulatory Visit: Payer: Self-pay

## 2019-07-20 ENCOUNTER — Ambulatory Visit (INDEPENDENT_AMBULATORY_CARE_PROVIDER_SITE_OTHER): Payer: Medicare Other | Admitting: Surgery

## 2019-07-20 ENCOUNTER — Encounter (HOSPITAL_COMMUNITY): Admission: RE | Payer: Self-pay | Source: Home / Self Care

## 2019-07-20 ENCOUNTER — Other Ambulatory Visit (HOSPITAL_COMMUNITY)
Admission: RE | Admit: 2019-07-20 | Discharge: 2019-07-20 | Disposition: A | Discharge status: Home / Self Care | Payer: Medicare Other | Source: Ambulatory Visit | Attending: Surgery | Admitting: Surgery

## 2019-07-20 ENCOUNTER — Encounter: Payer: Self-pay | Admitting: Surgery

## 2019-07-20 VITALS — BP 154/72 | HR 100 | Temp 97.8°F | Resp 20 | Ht 64.5 in | Wt 139.8 lb

## 2019-07-20 DIAGNOSIS — I70235 Atherosclerosis of native arteries of right leg with ulceration of other part of foot: Secondary | ICD-10-CM

## 2019-07-20 DIAGNOSIS — I6523 Occlusion and stenosis of bilateral carotid arteries: Secondary | ICD-10-CM

## 2019-07-20 LAB — SARS CORONAVIRUS 2 (TAT 6-24 HRS): SARS Coronavirus 2: NEGATIVE

## 2019-07-20 SURGERY — LOWER EXTREMITY ANGIOGRAPHY
Anesthesia: LOCAL

## 2019-07-20 NOTE — Progress Notes (Signed)
Spoke with pt for pre-op call. Pt states she has a heart murmur and she takes Metoprolol to keep from having tachycardia. She denies any recent chest pain or sob. Pt is a type 2 diabetic. Last A1C was 7.1 on 06/29/19. Pt states her fasting blood sugar is usually between 120-125. Instructed pt not to take any of her diabetic medications in the AM. Instructed her to check her blood sugar when she gets up in the AM and every 2 hours until she leaves for the hospital. If blood sugar is 70 or below, treat with 1/2 cup of clear juice (apple or cranberry) and recheck blood sugar 15 minutes after drinking juice. If blood sugar continues to be 70 or below, call the Short Stay department and ask to speak to a nurse. Pt voiced understanding.  Pt had her Covid test done today. She states she is in quarantine.  Reviewed visitation policy with pt and she voiced understanding.

## 2019-07-20 NOTE — H&P (View-Only) (Signed)
Vascular and Vein Specialist of [facility]  Patient name: [name] MRN: [id number] DOB: [birth year] Sex: female   REQUESTING PROVIDER:    Dr. Gwenlyn Found   REASON FOR CONSULT:    ulcer  HISTORY OF PRESENT ILLNESS:   [name] is a 78 y.o. female, who is referred for evaluation of a right foot ulcer.  The patient states that she has been dealing with this since July.  She has undergone angiography recently that showed severe disease in the right common femoral and profundofemoral artery as well as the superficial femoral artery.  She has been followed by podiatry.  She has a nitroglycerin patch on her foot.  She has been on antibiotics in the past but none currently.  She also has a ulcer on her left toe.  Patient suffers from diabetes.  She is not on insulin.  She is medically managed for hypertension.  She takes a statin for hypercholesterolemia.  She is a former smoker.   PAST MEDICAL HISTORY    Past Medical History:  Diagnosis Date  . Diabetes mellitus without complication ([place])   . Hyperlipidemia   . Hypertension 11/20/11   ECHO- EF>55% Borderline concentric left ventricular hypertrophy. There is a small calcified mass in the L:A near the LA appendage. No valvular masses seen with associated mitral annular calcification. LA Volume/ BSA27.4 ml/m2 No AS. Right ventricular systolic pressure is elevated at 25mmHg.     FAMILY HISTORY   Family History  Problem Relation Age of Onset  . Cancer - Prostate Father   . Cancer - Colon Father   . Stroke Mother   . Hypertension Mother   . Hyperlipidemia Mother   . Melanoma Brother     SOCIAL HISTORY:   Social History   Socioeconomic History  . Marital status: Widowed    Spouse name: Not on file  . Number of children: Not on file  . Years of education: Not on file  . Highest education level: Not on file  Occupational History  . Not on file  Social Needs  . Financial resource strain:  Not on file  . Food insecurity    Worry: Not on file    Inability: Not on file  . Transportation needs    Medical: Not on file    Non-medical: Not on file  Tobacco Use  . Smoking status: Former Smoker    Quit date: 03/31/1972    Years since quitting: 47.3  . Smokeless tobacco: Never Used  Substance and Sexual Activity  . Alcohol use: No  . Drug use: No  . Sexual activity: Not on file  Lifestyle  . Physical activity    Days per week: Not on file    Minutes per session: Not on file  . Stress: Not on file  Relationships  . Social Herbalist on phone: Not on file    Gets together: Not on file    Attends religious service: Not on file    Active member of club or organization: Not on file    Attends meetings of clubs or organizations: Not on file    Relationship status: Not on file  . Intimate partner violence    Fear of current or ex partner: Not on file    Emotionally abused: Not on file    Physically abused: Not on file    Forced sexual activity: Not on file  Other Topics Concern  . Not on file  Social History Narrative  .  Not on file    ALLERGIES:    Allergies  Allergen Reactions  . Demerol [Meperidine]     Delusional   . Scopolamine     Delusional     CURRENT MEDICATIONS:    Current Outpatient Medications  Medication Sig Dispense Refill  . acetaminophen (TYLENOL) 500 MG tablet Take 1,000 mg by mouth every 6 (six) hours as needed for moderate pain or headache.    . Ascorbic Acid (VITAMIN C) 1000 MG tablet Take 1,000 mg by mouth daily with supper.     Marland Kitchen aspirin 81 MG tablet Take 81 mg by mouth every evening.     Marland Kitchen atorvastatin (LIPITOR) 20 MG tablet Take 1 tablet by mouth once daily (Patient taking differently: Take 20 mg by mouth every evening. ) 90 tablet 0  . calcium carbonate (TUMS - DOSED IN MG ELEMENTAL CALCIUM) 500 MG chewable tablet Chew 2 tablets by mouth daily as needed for indigestion or heartburn.    Marland Kitchen CINNAMON PO Take 1,000 mg by mouth 3  (three) times daily with meals.     Marland Kitchen diltiazem (CARTIA XT) 240 MG 24 hr capsule Take 1 capsule (240 mg total) by mouth daily. 90 capsule 3  . fish oil-omega-3 fatty acids 1000 MG capsule Take 1 g by mouth 4 (four) times a week. Sun, Mon, Wed, Fri at night    . glipiZIDE (GLUCOTROL XL) 5 MG 24 hr tablet TAKE 1 TABLET BY MOUTH ONCE DAILY WITH BREAKFAST (Patient taking differently: TAKE 1 TABLET BY MOUTH ONCE DAILY WITH BREAKFAST) 90 tablet 2  . hydrochlorothiazide (HYDRODIURIL) 25 MG tablet Take 1 tablet (25 mg total) by mouth daily. 90 tablet 1  . INVOKANA 100 MG TABS tablet TAKE 1 TABLET BY MOUTH BEFORE BREAKFAST (Patient taking differently: Take 100 mg by mouth daily before breakfast. ) 30 tablet 0  . Magnesium 250 MG TABS Take 250 mg by mouth daily with supper.    . metFORMIN (GLUCOPHAGE) 1000 MG tablet TAKE 1 TABLET BY MOUTH TWICE DAILY WITH A MEAL (Patient taking differently: Take 1,000 mg by mouth 2 (two) times daily. ) 180 tablet 0  . metoprolol (LOPRESSOR) 100 MG tablet Take half tab in the morning and a whole tab in the evening. (Patient taking differently: Take 50-100 mg by mouth See admin instructions. Take 50 mg in the morning and 100 mg in the evening) 45 tablet 3  . nitroGLYCERIN (NITRO-DUR) 0.2 mg/hr patch Place 1 patch (0.2 mg total) onto the skin daily. Apply behind right toe daily 30 patch 12  . omeprazole (PRILOSEC) 20 MG capsule Take 20 mg by mouth 2 (two) times a week. Monday and Friday evenings    . OZEMPIC, 0.25 OR 0.5 MG/DOSE, 2 MG/1.5ML SOPN INJECT 0.5MG  INTO THE SKIN ONCE A WEEK (Patient taking differently: Inject 0.5 mg as directed every Friday. ) 2 mL 0  . telmisartan (MICARDIS) 80 MG tablet Take 1 tablet (80 mg total) by mouth daily. (Patient taking differently: Take 40 mg by mouth every evening. ) 90 tablet 3  . vitamin B-12 (CYANOCOBALAMIN) 1000 MCG tablet Take 1,000 mcg by mouth daily with supper.     No current facility-administered medications for this visit.      REVIEW OF SYSTEMS:   [X]  denotes positive finding, [ ]  denotes negative finding Cardiac  Comments:  Chest pain or chest pressure:    Shortness of breath upon exertion:    Short of breath when lying flat:    Irregular heart rhythm:  x       Vascular    Pain in calf, thigh, or hip brought on by ambulation: x   Pain in feet at night that wakes you up from your sleep:  xx   Blood clot in your veins: x   Leg swelling:         Pulmonary    Oxygen at home:    Productive cough:     Wheezing:         Neurologic    Sudden weakness in arms or legs:     Sudden numbness in arms or legs:     Sudden onset of difficulty speaking or slurred speech:    Temporary loss of vision in one eye:     Problems with dizziness:         Gastrointestinal    Blood in stool:      Vomited blood:         Genitourinary    Burning when urinating:     Blood in urine:        Psychiatric    Major depression:         Hematologic    Bleeding problems:    Problems with blood clotting too easily:        Skin    Rashes or ulcers: x       Constitutional    Fever or chills:     PHYSICAL EXAM:   Vitals:   07/20/19 1027  BP: (!) 154/72  Pulse: 100  Resp: 20  Temp: 97.8 F (36.6 C)  SpO2: 100%  Weight: 139 lb 12.8 oz (63.4 kg)  Height: 5' 4.5" (1.638 m)    GENERAL: The patient is a well-nourished female, in no acute distress. The vital signs are documented above. CARDIAC: There is a regular rate and rhythm.  VASCULAR: Nonpalpable pedal pulses PULMONARY: Nonlabored respirations ABDOMEN: Soft and non-tender with normal pitched bowel sounds.  MUSCULOSKELETAL: There are no major deformities or cyanosis. NEUROLOGIC: No focal weakness or paresthesias are detected. SKIN: Gangrenous changes to toes 1 through 4 on the right and ulcer on the left fourth toe  PSYCHIATRIC: The patient has a normal affect.  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  She has been dealing with this since July and has gotten progressively worse.  Currently she has dry gangrene of the majority of toes 1 through 3 and a ulcer on the dorsum of toe #4 with erythema extending up to the midfoot.  I proposed proceeding with right femoral endarterectomy and patch angioplasty.  She will also need to have percutaneous intervention of the right superficial femoral artery which will likely have to be done as a staged procedure.  I am going to do a femoral endarterectomy tomorrow.  All questions were answered today.   Leia Alf, MD, FACS [facility] and Vein Specialists of [facility] [phone] Pager [phone]

## 2019-07-20 NOTE — Progress Notes (Signed)
Vascular and Vein Specialist of [facility]  Patient name: [name] MRN: [id number] DOB: 12-28-1940 Sex: female   REQUESTING PROVIDER:    Dr. Gwenlyn Found   REASON FOR CONSULT:    ulcer  HISTORY OF PRESENT ILLNESS:   [name] is a 78 y.o. female, who is referred for evaluation of a right foot ulcer.  The patient states that she has been dealing with this since July.  She has undergone angiography recently that showed severe disease in the right common femoral and profundofemoral artery as well as the superficial femoral artery.  She has been followed by podiatry.  She has a nitroglycerin patch on her foot.  She has been on antibiotics in the past but none currently.  She also has a ulcer on her left toe.  Patient suffers from diabetes.  She is not on insulin.  She is medically managed for hypertension.  She takes a statin for hypercholesterolemia.  She is a former smoker.   PAST MEDICAL HISTORY    Past Medical History:  Diagnosis Date  . Diabetes mellitus without complication ([place])   . Hyperlipidemia   . Hypertension 11/20/11   ECHO- EF>55% Borderline concentric left ventricular hypertrophy. There is a small calcified mass in the L:A near the LA appendage. No valvular masses seen with associated mitral annular calcification. LA Volume/ BSA27.4 ml/m2 No AS. Right ventricular systolic pressure is elevated at 16mmHg.     FAMILY HISTORY   Family History  Problem Relation Age of Onset  . Cancer - Prostate Father   . Cancer - Colon Father   . Stroke Mother   . Hypertension Mother   . Hyperlipidemia Mother   . Melanoma Brother     SOCIAL HISTORY:   Social History   Socioeconomic History  . Marital status: Widowed    Spouse name: Not on file  . Number of children: Not on file  . Years of education: Not on file  . Highest education level: Not on file  Occupational History  . Not on file  Social Needs  . Financial resource strain:  Not on file  . Food insecurity    Worry: Not on file    Inability: Not on file  . Transportation needs    Medical: Not on file    Non-medical: Not on file  Tobacco Use  . Smoking status: Former Smoker    Quit date: 03/31/1972    Years since quitting: 47.3  . Smokeless tobacco: Never Used  Substance and Sexual Activity  . Alcohol use: No  . Drug use: No  . Sexual activity: Not on file  Lifestyle  . Physical activity    Days per week: Not on file    Minutes per session: Not on file  . Stress: Not on file  Relationships  . Social Herbalist on phone: Not on file    Gets together: Not on file    Attends religious service: Not on file    Active member of club or organization: Not on file    Attends meetings of clubs or organizations: Not on file    Relationship status: Not on file  . Intimate partner violence    Fear of current or ex partner: Not on file    Emotionally abused: Not on file    Physically abused: Not on file    Forced sexual activity: Not on file  Other Topics Concern  . Not on file  Social History Narrative  .  Not on file    ALLERGIES:    Allergies  Allergen Reactions  . Demerol [Meperidine]     Delusional   . Scopolamine     Delusional     CURRENT MEDICATIONS:    Current Outpatient Medications  Medication Sig Dispense Refill  . acetaminophen (TYLENOL) 500 MG tablet Take 1,000 mg by mouth every 6 (six) hours as needed for moderate pain or headache.    . Ascorbic Acid (VITAMIN C) 1000 MG tablet Take 1,000 mg by mouth daily with supper.     Marland Kitchen aspirin 81 MG tablet Take 81 mg by mouth every evening.     Marland Kitchen atorvastatin (LIPITOR) 20 MG tablet Take 1 tablet by mouth once daily (Patient taking differently: Take 20 mg by mouth every evening. ) 90 tablet 0  . calcium carbonate (TUMS - DOSED IN MG ELEMENTAL CALCIUM) 500 MG chewable tablet Chew 2 tablets by mouth daily as needed for indigestion or heartburn.    Marland Kitchen CINNAMON PO Take 1,000 mg by mouth 3  (three) times daily with meals.     Marland Kitchen diltiazem (CARTIA XT) 240 MG 24 hr capsule Take 1 capsule (240 mg total) by mouth daily. 90 capsule 3  . fish oil-omega-3 fatty acids 1000 MG capsule Take 1 g by mouth 4 (four) times a week. Sun, Mon, Wed, Fri at night    . glipiZIDE (GLUCOTROL XL) 5 MG 24 hr tablet TAKE 1 TABLET BY MOUTH ONCE DAILY WITH BREAKFAST (Patient taking differently: TAKE 1 TABLET BY MOUTH ONCE DAILY WITH BREAKFAST) 90 tablet 2  . hydrochlorothiazide (HYDRODIURIL) 25 MG tablet Take 1 tablet (25 mg total) by mouth daily. 90 tablet 1  . INVOKANA 100 MG TABS tablet TAKE 1 TABLET BY MOUTH BEFORE BREAKFAST (Patient taking differently: Take 100 mg by mouth daily before breakfast. ) 30 tablet 0  . Magnesium 250 MG TABS Take 250 mg by mouth daily with supper.    . metFORMIN (GLUCOPHAGE) 1000 MG tablet TAKE 1 TABLET BY MOUTH TWICE DAILY WITH A MEAL (Patient taking differently: Take 1,000 mg by mouth 2 (two) times daily. ) 180 tablet 0  . metoprolol (LOPRESSOR) 100 MG tablet Take half tab in the morning and a whole tab in the evening. (Patient taking differently: Take 50-100 mg by mouth See admin instructions. Take 50 mg in the morning and 100 mg in the evening) 45 tablet 3  . nitroGLYCERIN (NITRO-DUR) 0.2 mg/hr patch Place 1 patch (0.2 mg total) onto the skin daily. Apply behind right toe daily 30 patch 12  . omeprazole (PRILOSEC) 20 MG capsule Take 20 mg by mouth 2 (two) times a week. Monday and Friday evenings    . OZEMPIC, 0.25 OR 0.5 MG/DOSE, 2 MG/1.5ML SOPN INJECT 0.5MG  INTO THE SKIN ONCE A WEEK (Patient taking differently: Inject 0.5 mg as directed every Friday. ) 2 mL 0  . telmisartan (MICARDIS) 80 MG tablet Take 1 tablet (80 mg total) by mouth daily. (Patient taking differently: Take 40 mg by mouth every evening. ) 90 tablet 3  . vitamin B-12 (CYANOCOBALAMIN) 1000 MCG tablet Take 1,000 mcg by mouth daily with supper.     No current facility-administered medications for this visit.      REVIEW OF SYSTEMS:   [X]  denotes positive finding, [ ]  denotes negative finding Cardiac  Comments:  Chest pain or chest pressure:    Shortness of breath upon exertion:    Short of breath when lying flat:    Irregular heart rhythm:  x       Vascular    Pain in calf, thigh, or hip brought on by ambulation: x   Pain in feet at night that wakes you up from your sleep:  xx   Blood clot in your veins: x   Leg swelling:         Pulmonary    Oxygen at home:    Productive cough:     Wheezing:         Neurologic    Sudden weakness in arms or legs:     Sudden numbness in arms or legs:     Sudden onset of difficulty speaking or slurred speech:    Temporary loss of vision in one eye:     Problems with dizziness:         Gastrointestinal    Blood in stool:      Vomited blood:         Genitourinary    Burning when urinating:     Blood in urine:        Psychiatric    Major depression:         Hematologic    Bleeding problems:    Problems with blood clotting too easily:        Skin    Rashes or ulcers: x       Constitutional    Fever or chills:     PHYSICAL EXAM:   Vitals:   07/20/19 1027  BP: (!) 154/72  Pulse: 100  Resp: 20  Temp: 97.8 F (36.6 C)  SpO2: 100%  Weight: 139 lb 12.8 oz (63.4 kg)  Height: 5' 4.5" (1.638 m)    GENERAL: The patient is a well-nourished female, in no acute distress. The vital signs are documented above. CARDIAC: There is a regular rate and rhythm.  VASCULAR: Nonpalpable pedal pulses PULMONARY: Nonlabored respirations ABDOMEN: Soft and non-tender with normal pitched bowel sounds.  MUSCULOSKELETAL: There are no major deformities or cyanosis. NEUROLOGIC: No focal weakness or paresthesias are detected. SKIN: Gangrenous changes to toes 1 through 4 on the right and ulcer on the left fourth toe  PSYCHIATRIC: The patient has a normal affect.  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  She has been dealing with this since July and has gotten progressively worse.  Currently she has dry gangrene of the majority of toes 1 through 3 and a ulcer on the dorsum of toe #4 with erythema extending up to the midfoot.  I proposed proceeding with right femoral endarterectomy and patch angioplasty.  She will also need to have percutaneous intervention of the right superficial femoral artery which will likely have to be done as a staged procedure.  I am going to do a femoral endarterectomy tomorrow.  All questions were answered today.   Leia Alf, MD, FACS Vascular and Vein Specialists of [facility] [phone] Pager [phone]

## 2019-07-21 ENCOUNTER — Inpatient Hospital Stay (HOSPITAL_COMMUNITY): Payer: Medicare Other | Admitting: Anesthesiology

## 2019-07-21 ENCOUNTER — Encounter (HOSPITAL_COMMUNITY)
Admission: AD | Disposition: A | Discharge status: Home / Self Care | Payer: Self-pay | Source: Home / Self Care | Attending: Surgery

## 2019-07-21 ENCOUNTER — Ambulatory Visit (HOSPITAL_BASED_OUTPATIENT_CLINIC_OR_DEPARTMENT_OTHER): Payer: Medicare Other | Admitting: Internal Medicine

## 2019-07-21 ENCOUNTER — Other Ambulatory Visit: Payer: Self-pay

## 2019-07-21 ENCOUNTER — Encounter (HOSPITAL_COMMUNITY): Payer: Self-pay | Admitting: Anesthesiology

## 2019-07-21 ENCOUNTER — Inpatient Hospital Stay (HOSPITAL_COMMUNITY)
Admission: AD | Admit: 2019-07-21 | Discharge: 2019-07-22 | DRG: 271 | Disposition: A | Discharge status: Home / Self Care | Payer: Medicare Other | Attending: Surgery | Admitting: Surgery

## 2019-07-21 DIAGNOSIS — Z20828 Contact with and (suspected) exposure to other viral communicable diseases: Secondary | ICD-10-CM | POA: Diagnosis present

## 2019-07-21 DIAGNOSIS — Z87891 Personal history of nicotine dependence: Secondary | ICD-10-CM | POA: Diagnosis not present

## 2019-07-21 DIAGNOSIS — Z823 Family history of stroke: Secondary | ICD-10-CM

## 2019-07-21 DIAGNOSIS — Z8249 Family history of ischemic heart disease and other diseases of the circulatory system: Secondary | ICD-10-CM

## 2019-07-21 DIAGNOSIS — E7800 Pure hypercholesterolemia, unspecified: Secondary | ICD-10-CM | POA: Diagnosis present

## 2019-07-21 DIAGNOSIS — L97409 Non-pressure chronic ulcer of unspecified heel and midfoot with unspecified severity: Secondary | ICD-10-CM | POA: Diagnosis present

## 2019-07-21 DIAGNOSIS — I739 Peripheral vascular disease, unspecified: Secondary | ICD-10-CM | POA: Diagnosis present

## 2019-07-21 DIAGNOSIS — Z808 Family history of malignant neoplasm of other organs or systems: Secondary | ICD-10-CM | POA: Diagnosis not present

## 2019-07-21 DIAGNOSIS — Z7984 Long term (current) use of oral hypoglycemic drugs: Secondary | ICD-10-CM | POA: Diagnosis not present

## 2019-07-21 DIAGNOSIS — Z8349 Family history of other endocrine, nutritional and metabolic diseases: Secondary | ICD-10-CM

## 2019-07-21 DIAGNOSIS — E785 Hyperlipidemia, unspecified: Secondary | ICD-10-CM | POA: Diagnosis present

## 2019-07-21 DIAGNOSIS — I10 Essential (primary) hypertension: Secondary | ICD-10-CM | POA: Diagnosis present

## 2019-07-21 DIAGNOSIS — E1152 Type 2 diabetes mellitus with diabetic peripheral angiopathy with gangrene: Principal | ICD-10-CM | POA: Diagnosis present

## 2019-07-21 DIAGNOSIS — E11621 Type 2 diabetes mellitus with foot ulcer: Secondary | ICD-10-CM | POA: Diagnosis present

## 2019-07-21 HISTORY — PX: PATCH ANGIOPLASTY: SHX6230

## 2019-07-21 HISTORY — DX: Unspecified osteoarthritis, unspecified site: M19.90

## 2019-07-21 HISTORY — PX: ENDARTERECTOMY FEMORAL: SHX5804

## 2019-07-21 HISTORY — DX: Pneumonia, unspecified organism: J18.9

## 2019-07-21 HISTORY — PX: APPLICATION OF WOUND VAC: SHX5189

## 2019-07-21 HISTORY — DX: Peripheral vascular disease, unspecified: I73.9

## 2019-07-21 HISTORY — DX: Personal history of other diseases of the digestive system: Z87.19

## 2019-07-21 HISTORY — DX: Cardiac murmur, unspecified: R01.1

## 2019-07-21 LAB — POCT I-STAT, CHEM 8
BUN: 15 mg/dL (ref 8–23)
Calcium, Ion: 1.22 mmol/L (ref 1.15–1.40)
Chloride: 104 mmol/L (ref 98–111)
Creatinine, Ser: 0.4 mg/dL — ABNORMAL LOW (ref 0.44–1.00)
Glucose, Bld: 115 mg/dL — ABNORMAL HIGH (ref 70–99)
HCT: 40 % (ref 36.0–46.0)
Hemoglobin: 13.6 g/dL (ref 12.0–15.0)
Potassium: 3.4 mmol/L — ABNORMAL LOW (ref 3.5–5.1)
Sodium: 139 mmol/L (ref 135–145)
TCO2: 26 mmol/L (ref 22–32)

## 2019-07-21 LAB — CBC
HCT: 38.6 % (ref 36.0–46.0)
HCT: 31.9 % — ABNORMAL LOW (ref 36.0–46.0)
Hemoglobin: 12.1 g/dL (ref 12.0–15.0)
Hemoglobin: 10.1 g/dL — ABNORMAL LOW (ref 12.0–15.0)
MCH: 29 pg (ref 26.0–34.0)
MCH: 29.1 pg (ref 26.0–34.0)
MCHC: 31.3 g/dL (ref 30.0–36.0)
MCHC: 31.7 g/dL (ref 30.0–36.0)
MCV: 92.6 fL (ref 80.0–100.0)
MCV: 91.9 fL (ref 80.0–100.0)
Platelets: 542 10*3/uL — ABNORMAL HIGH (ref 150–400)
Platelets: 510 10*3/uL — ABNORMAL HIGH (ref 150–400)
RBC: 4.17 MIL/uL (ref 3.87–5.11)
RBC: 3.47 MIL/uL — ABNORMAL LOW (ref 3.87–5.11)
RDW: 12.4 % (ref 11.5–15.5)
RDW: 12.4 % (ref 11.5–15.5)
WBC: 17.3 10*3/uL — ABNORMAL HIGH (ref 4.0–10.5)
WBC: 17.9 10*3/uL — ABNORMAL HIGH (ref 4.0–10.5)
nRBC: 0 % (ref 0.0–0.2)
nRBC: 0 % (ref 0.0–0.2)

## 2019-07-21 LAB — URINALYSIS, ROUTINE W REFLEX MICROSCOPIC
Bilirubin Urine: NEGATIVE
Glucose, UA: >=500 mg/dL — AB
Hgb urine dipstick: NEGATIVE
Ketones, ur: NEGATIVE mg/dL
Leukocytes,Ua: NEGATIVE
Nitrite: NEGATIVE
Protein, ur: NEGATIVE mg/dL
Specific Gravity, Urine: 1.03 (ref 1.005–1.030)
pH: 5 (ref 5.0–8.0)

## 2019-07-21 LAB — POCT ACTIVATED CLOTTING TIME
Activated Clotting Time: 235 seconds
Activated Clotting Time: 202 seconds

## 2019-07-21 LAB — TYPE AND SCREEN
ABO/RH(D): O POS
Antibody Screen: NEGATIVE

## 2019-07-21 LAB — HEMOGLOBIN A1C
Hgb A1c MFr Bld: 6.7 % — ABNORMAL HIGH (ref 4.8–5.6)
Mean Plasma Glucose: 145.59 mg/dL

## 2019-07-21 LAB — PROTIME-INR
INR: 1.1 (ref 0.8–1.2)
Prothrombin Time: 14 seconds (ref 11.4–15.2)

## 2019-07-21 LAB — COMPREHENSIVE METABOLIC PANEL
ALT: 15 U/L (ref 0–44)
AST: 19 U/L (ref 15–41)
Albumin: 3.1 g/dL — ABNORMAL LOW (ref 3.5–5.0)
Alkaline Phosphatase: 52 U/L (ref 38–126)
Anion gap: 17 — ABNORMAL HIGH (ref 5–15)
BUN: 18 mg/dL (ref 8–23)
CO2: 23 mmol/L (ref 22–32)
Calcium: 10.2 mg/dL (ref 8.9–10.3)
Chloride: 99 mmol/L (ref 98–111)
Creatinine, Ser: 0.82 mg/dL (ref 0.44–1.00)
GFR calc Af Amer: >60 mL/min (ref >60)
GFR calc non Af Amer: >60 mL/min (ref >60)
Glucose, Bld: 155 mg/dL — ABNORMAL HIGH (ref 70–99)
Potassium: 4.1 mmol/L (ref 3.5–5.1)
Sodium: 139 mmol/L (ref 135–145)
Total Bilirubin: 0.4 mg/dL (ref 0.3–1.2)
Total Protein: 6.8 g/dL (ref 6.5–8.1)

## 2019-07-21 LAB — CREATININE, SERUM
Creatinine, Ser: 0.7 mg/dL (ref 0.44–1.00)
GFR calc Af Amer: >60 mL/min (ref >60)
GFR calc non Af Amer: >60 mL/min (ref >60)

## 2019-07-21 LAB — GLUCOSE, CAPILLARY
Glucose-Capillary: 169 mg/dL — ABNORMAL HIGH (ref 70–99)
Glucose-Capillary: 145 mg/dL — ABNORMAL HIGH (ref 70–99)
Glucose-Capillary: 159 mg/dL — ABNORMAL HIGH (ref 70–99)
Glucose-Capillary: 246 mg/dL — ABNORMAL HIGH (ref 70–99)

## 2019-07-21 LAB — ABO/RH: ABO/RH(D): O POS

## 2019-07-21 LAB — APTT: aPTT: 31 seconds (ref 24–36)

## 2019-07-21 SURGERY — ENDARTERECTOMY, FEMORAL
Anesthesia: General | Site: Groin | Laterality: Right

## 2019-07-21 MED ORDER — CANAGLIFLOZIN 100 MG PO TABS
100.0000 mg | ORAL_TABLET | Freq: Every day | ORAL | Status: DC
  Administered 2019-07-22: 100 mg via ORAL
  Filled 2019-07-21: qty 1

## 2019-07-21 MED ORDER — HYDROMORPHONE HCL 1 MG/ML IJ SOLN: 0.5000 mg | INTRAMUSCULAR | Status: DC | PRN

## 2019-07-21 MED ORDER — INSULIN ASPART 100 UNIT/ML ~~LOC~~ SOLN
0.0000 [IU] | Freq: Three times a day (TID) | SUBCUTANEOUS | Status: DC
  Administered 2019-07-21 – 2019-07-22 (×2): 2 [IU] via SUBCUTANEOUS

## 2019-07-21 MED ORDER — SODIUM CHLORIDE 0.9 % IV SOLN
INTRAVENOUS | Status: DC
  Administered 2019-07-21: 17:00:00 via INTRAVENOUS

## 2019-07-21 MED ORDER — SODIUM CHLORIDE 0.9 % IV SOLN
INTRAVENOUS | Status: AC
  Filled 2019-07-21: qty 1.2

## 2019-07-21 MED ORDER — VANCOMYCIN HCL IN DEXTROSE 1-5 GM/200ML-% IV SOLN
1000.0000 mg | Freq: Two times a day (BID) | INTRAVENOUS | Status: AC
  Administered 2019-07-21 – 2019-07-22 (×2): 1000 mg via INTRAVENOUS
  Filled 2019-07-21 (×2): qty 200

## 2019-07-21 MED ORDER — DEXAMETHASONE SODIUM PHOSPHATE 10 MG/ML IJ SOLN
INTRAMUSCULAR | Status: AC
  Filled 2019-07-21: qty 1

## 2019-07-21 MED ORDER — HEPARIN SODIUM (PORCINE) 1000 UNIT/ML IJ SOLN
INTRAMUSCULAR | Status: AC
  Filled 2019-07-21: qty 1

## 2019-07-21 MED ORDER — INSULIN ASPART 100 UNIT/ML ~~LOC~~ SOLN: 0.0000 [IU] | Freq: Three times a day (TID) | SUBCUTANEOUS | Status: DC

## 2019-07-21 MED ORDER — GLIPIZIDE ER 5 MG PO TB24
5.0000 mg | ORAL_TABLET | Freq: Every day | ORAL | Status: DC
  Administered 2019-07-22: 5 mg via ORAL
  Filled 2019-07-21: qty 1

## 2019-07-21 MED ORDER — ACETAMINOPHEN 325 MG RE SUPP: 325.0000 mg | RECTAL | Status: DC | PRN

## 2019-07-21 MED ORDER — LACTATED RINGERS IV SOLN: INTRAVENOUS | Status: DC | PRN

## 2019-07-21 MED ORDER — HEMOSTATIC AGENTS (NO CHARGE) OPTIME
TOPICAL | Status: DC | PRN
  Administered 2019-07-21: 1 via TOPICAL

## 2019-07-21 MED ORDER — CEFAZOLIN SODIUM-DEXTROSE 2-4 GM/100ML-% IV SOLN
INTRAVENOUS | Status: AC
  Filled 2019-07-21: qty 100

## 2019-07-21 MED ORDER — HEPARIN SODIUM (PORCINE) 1000 UNIT/ML IJ SOLN
INTRAMUSCULAR | Status: DC | PRN
  Administered 2019-07-21: 1000 [IU] via INTRAVENOUS
  Administered 2019-07-21: 7000 [IU] via INTRAVENOUS

## 2019-07-21 MED ORDER — BISACODYL 5 MG PO TBEC: 5.0000 mg | DELAYED_RELEASE_TABLET | Freq: Every day | ORAL | Status: DC | PRN

## 2019-07-21 MED ORDER — NITROGLYCERIN 0.2 MG/HR TD PT24
0.2000 mg | MEDICATED_PATCH | Freq: Every day | TRANSDERMAL | Status: DC
  Administered 2019-07-22: 0.2 mg via TRANSDERMAL
  Filled 2019-07-21: qty 1

## 2019-07-21 MED ORDER — PROTAMINE SULFATE 10 MG/ML IV SOLN
INTRAVENOUS | Status: DC | PRN
  Administered 2019-07-21: 50 mg via INTRAVENOUS

## 2019-07-21 MED ORDER — ACETAMINOPHEN 325 MG PO TABS
325.0000 mg | ORAL_TABLET | ORAL | Status: DC | PRN
  Administered 2019-07-21 – 2019-07-22 (×2): 650 mg via ORAL
  Filled 2019-07-21 (×2): qty 2

## 2019-07-21 MED ORDER — CALCIUM CARBONATE ANTACID 500 MG PO CHEW: 2.0000 | CHEWABLE_TABLET | Freq: Every day | ORAL | Status: DC | PRN

## 2019-07-21 MED ORDER — SODIUM CHLORIDE 0.9 % IV SOLN
INTRAVENOUS | Status: DC | PRN
  Administered 2019-07-21: 12:00:00

## 2019-07-21 MED ORDER — LIDOCAINE 2% (20 MG/ML) 5 ML SYRINGE
INTRAMUSCULAR | Status: DC | PRN
  Administered 2019-07-21: 80 mg via INTRAVENOUS

## 2019-07-21 MED ORDER — PHENOL 1.4 % MT LIQD: 1.0000 | OROMUCOSAL | Status: DC | PRN

## 2019-07-21 MED ORDER — PROTAMINE SULFATE 10 MG/ML IV SOLN
INTRAVENOUS | Status: AC
  Filled 2019-07-21: qty 5

## 2019-07-21 MED ORDER — SODIUM CHLORIDE 0.9 % IV SOLN
INTRAVENOUS | Status: DC | PRN
  Administered 2019-07-21 (×2): via INTRAVENOUS

## 2019-07-21 MED ORDER — ONDANSETRON HCL 4 MG/2ML IJ SOLN: 4.0000 mg | Freq: Once | INTRAMUSCULAR | Status: DC | PRN

## 2019-07-21 MED ORDER — HYDROMORPHONE HCL 1 MG/ML IJ SOLN: 0.2500 mg | INTRAMUSCULAR | Status: DC | PRN

## 2019-07-21 MED ORDER — HEPARIN SODIUM (PORCINE) 5000 UNIT/ML IJ SOLN: 5000.0000 [IU] | Freq: Three times a day (TID) | INTRAMUSCULAR | Status: DC

## 2019-07-21 MED ORDER — ONDANSETRON HCL 4 MG/2ML IJ SOLN: 4.0000 mg | Freq: Four times a day (QID) | INTRAMUSCULAR | Status: DC | PRN

## 2019-07-21 MED ORDER — SENNOSIDES-DOCUSATE SODIUM 8.6-50 MG PO TABS: 1.0000 | ORAL_TABLET | Freq: Every evening | ORAL | Status: DC | PRN

## 2019-07-21 MED ORDER — 0.9 % SODIUM CHLORIDE (POUR BTL) OPTIME
TOPICAL | Status: DC | PRN
  Administered 2019-07-21: 1000 mL

## 2019-07-21 MED ORDER — FENTANYL CITRATE (PF) 100 MCG/2ML IJ SOLN
INTRAMUSCULAR | Status: DC | PRN
  Administered 2019-07-21: 50 ug via INTRAVENOUS
  Administered 2019-07-21: 25 ug via INTRAVENOUS
  Administered 2019-07-21 (×2): 50 ug via INTRAVENOUS

## 2019-07-21 MED ORDER — CHLORHEXIDINE GLUCONATE 4 % EX LIQD: 60.0000 mL | Freq: Once | CUTANEOUS | Status: DC

## 2019-07-21 MED ORDER — MIDAZOLAM HCL 2 MG/2ML IJ SOLN
INTRAMUSCULAR | Status: AC
  Filled 2019-07-21: qty 2

## 2019-07-21 MED ORDER — HYDRALAZINE HCL 20 MG/ML IJ SOLN: 5.0000 mg | INTRAMUSCULAR | Status: DC | PRN

## 2019-07-21 MED ORDER — METOPROLOL TARTRATE 50 MG PO TABS
50.0000 mg | ORAL_TABLET | Freq: Every day | ORAL | Status: DC
  Administered 2019-07-22: 50 mg via ORAL
  Filled 2019-07-21: qty 1

## 2019-07-21 MED ORDER — PANTOPRAZOLE SODIUM 40 MG PO TBEC
40.0000 mg | DELAYED_RELEASE_TABLET | Freq: Every day | ORAL | Status: DC
  Administered 2019-07-21 – 2019-07-22 (×2): 40 mg via ORAL
  Filled 2019-07-21 (×2): qty 1

## 2019-07-21 MED ORDER — OXYCODONE HCL 5 MG PO TABS: 5.0000 mg | ORAL_TABLET | ORAL | Status: DC | PRN

## 2019-07-21 MED ORDER — MIDAZOLAM HCL 5 MG/5ML IJ SOLN
INTRAMUSCULAR | Status: DC | PRN
  Administered 2019-07-21 (×2): 1 mg via INTRAVENOUS

## 2019-07-21 MED ORDER — ALUM & MAG HYDROXIDE-SIMETH 200-200-20 MG/5ML PO SUSP: 15.0000 mL | ORAL | Status: DC | PRN

## 2019-07-21 MED ORDER — FENTANYL CITRATE (PF) 250 MCG/5ML IJ SOLN
INTRAMUSCULAR | Status: AC
  Filled 2019-07-21: qty 5

## 2019-07-21 MED ORDER — ONDANSETRON HCL 4 MG/2ML IJ SOLN
INTRAMUSCULAR | Status: DC | PRN
  Administered 2019-07-21: 4 mg via INTRAVENOUS

## 2019-07-21 MED ORDER — METOPROLOL TARTRATE 50 MG PO TABS: 50.0000 mg | ORAL_TABLET | ORAL | Status: DC

## 2019-07-21 MED ORDER — PHENYLEPHRINE 40 MCG/ML (10ML) SYRINGE FOR IV PUSH (FOR BLOOD PRESSURE SUPPORT)
PREFILLED_SYRINGE | INTRAVENOUS | Status: DC | PRN
  Administered 2019-07-21: 40 ug via INTRAVENOUS
  Administered 2019-07-21: 80 ug via INTRAVENOUS
  Administered 2019-07-21: 40 ug via INTRAVENOUS

## 2019-07-21 MED ORDER — SEMAGLUTIDE(0.25 OR 0.5MG/DOS) 2 MG/1.5ML ~~LOC~~ SOPN: 0.5000 mg | PEN_INJECTOR | SUBCUTANEOUS | Status: DC

## 2019-07-21 MED ORDER — ACETAMINOPHEN 500 MG PO TABS: 1000.0000 mg | ORAL_TABLET | Freq: Four times a day (QID) | ORAL | Status: DC | PRN

## 2019-07-21 MED ORDER — ASPIRIN EC 81 MG PO TBEC
81.0000 mg | DELAYED_RELEASE_TABLET | Freq: Every evening | ORAL | Status: DC
  Administered 2019-07-21: 81 mg via ORAL
  Filled 2019-07-21: qty 1

## 2019-07-21 MED ORDER — METOPROLOL TARTRATE 5 MG/5ML IV SOLN: 2.0000 mg | INTRAVENOUS | Status: DC | PRN

## 2019-07-21 MED ORDER — DILTIAZEM HCL ER COATED BEADS 240 MG PO CP24
240.0000 mg | ORAL_CAPSULE | Freq: Every day | ORAL | Status: DC
  Administered 2019-07-22: 240 mg via ORAL
  Filled 2019-07-21: qty 1

## 2019-07-21 MED ORDER — HEPARIN SODIUM (PORCINE) 5000 UNIT/ML IJ SOLN
5000.0000 [IU] | Freq: Three times a day (TID) | INTRAMUSCULAR | Status: DC
  Administered 2019-07-22: 5000 [IU] via SUBCUTANEOUS
  Filled 2019-07-21: qty 1

## 2019-07-21 MED ORDER — METOPROLOL TARTRATE 100 MG PO TABS
100.0000 mg | ORAL_TABLET | Freq: Every day | ORAL | Status: DC
  Administered 2019-07-21: 100 mg via ORAL
  Filled 2019-07-21: qty 1

## 2019-07-21 MED ORDER — LIDOCAINE 2% (20 MG/ML) 5 ML SYRINGE
INTRAMUSCULAR | Status: AC
  Filled 2019-07-21: qty 5

## 2019-07-21 MED ORDER — ATORVASTATIN CALCIUM 10 MG PO TABS
20.0000 mg | ORAL_TABLET | Freq: Every day | ORAL | Status: DC
  Administered 2019-07-21 – 2019-07-22 (×2): 20 mg via ORAL
  Filled 2019-07-21 (×2): qty 2

## 2019-07-21 MED ORDER — ONDANSETRON HCL 4 MG/2ML IJ SOLN
INTRAMUSCULAR | Status: AC
  Filled 2019-07-21: qty 2

## 2019-07-21 MED ORDER — SODIUM CHLORIDE 0.9 % IV SOLN: 500.0000 mL | Freq: Once | INTRAVENOUS | Status: DC | PRN

## 2019-07-21 MED ORDER — POTASSIUM CHLORIDE CRYS ER 20 MEQ PO TBCR: 20.0000 meq | EXTENDED_RELEASE_TABLET | Freq: Every day | ORAL | Status: DC | PRN

## 2019-07-21 MED ORDER — MAGNESIUM SULFATE 2 GM/50ML IV SOLN: 2.0000 g | Freq: Every day | INTRAVENOUS | Status: DC | PRN

## 2019-07-21 MED ORDER — LABETALOL HCL 5 MG/ML IV SOLN
10.0000 mg | INTRAVENOUS | Status: DC | PRN
  Administered 2019-07-21: 10 mg via INTRAVENOUS
  Filled 2019-07-21: qty 4

## 2019-07-21 MED ORDER — SODIUM CHLORIDE 0.9 % IV SOLN
INTRAVENOUS | Status: DC
  Administered 2019-07-21: 11:00:00 via INTRAVENOUS

## 2019-07-21 MED ORDER — PIPERACILLIN-TAZOBACTAM 3.375 G IVPB
3.3750 g | Freq: Three times a day (TID) | INTRAVENOUS | Status: AC
  Administered 2019-07-21 – 2019-07-22 (×2): 3.375 g via INTRAVENOUS
  Filled 2019-07-21 (×2): qty 50

## 2019-07-21 MED ORDER — SUGAMMADEX SODIUM 200 MG/2ML IV SOLN
INTRAVENOUS | Status: DC | PRN
  Administered 2019-07-21: 130 mg via INTRAVENOUS

## 2019-07-21 MED ORDER — METFORMIN HCL 500 MG PO TABS
1000.0000 mg | ORAL_TABLET | Freq: Two times a day (BID) | ORAL | Status: DC
  Administered 2019-07-22: 1000 mg via ORAL
  Filled 2019-07-21: qty 2

## 2019-07-21 MED ORDER — HYDROCHLOROTHIAZIDE 25 MG PO TABS
25.0000 mg | ORAL_TABLET | Freq: Every day | ORAL | Status: DC
  Administered 2019-07-22: 25 mg via ORAL
  Filled 2019-07-21: qty 1

## 2019-07-21 MED ORDER — DEXAMETHASONE SODIUM PHOSPHATE 10 MG/ML IJ SOLN
INTRAMUSCULAR | Status: DC | PRN
  Administered 2019-07-21: 4 mg via INTRAVENOUS

## 2019-07-21 MED ORDER — ROCURONIUM BROMIDE 10 MG/ML (PF) SYRINGE
PREFILLED_SYRINGE | INTRAVENOUS | Status: AC
  Filled 2019-07-21: qty 10

## 2019-07-21 MED ORDER — PROPOFOL 10 MG/ML IV BOLUS
INTRAVENOUS | Status: DC | PRN
  Administered 2019-07-21: 120 mg via INTRAVENOUS

## 2019-07-21 MED ORDER — PROPOFOL 10 MG/ML IV BOLUS
INTRAVENOUS | Status: AC
  Filled 2019-07-21: qty 20

## 2019-07-21 MED ORDER — CEFAZOLIN SODIUM-DEXTROSE 2-4 GM/100ML-% IV SOLN
2.0000 g | INTRAVENOUS | Status: AC
  Administered 2019-07-21: 2 g via INTRAVENOUS

## 2019-07-21 MED ORDER — PHENYLEPHRINE 40 MCG/ML (10ML) SYRINGE FOR IV PUSH (FOR BLOOD PRESSURE SUPPORT)
PREFILLED_SYRINGE | INTRAVENOUS | Status: AC
  Filled 2019-07-21: qty 10

## 2019-07-21 MED ORDER — ROCURONIUM BROMIDE 50 MG/5ML IV SOSY
PREFILLED_SYRINGE | INTRAVENOUS | Status: DC | PRN
  Administered 2019-07-21: 10 mg via INTRAVENOUS
  Administered 2019-07-21: 60 mg via INTRAVENOUS

## 2019-07-21 MED ORDER — GUAIFENESIN-DM 100-10 MG/5ML PO SYRP: 15.0000 mL | ORAL_SOLUTION | ORAL | Status: DC | PRN

## 2019-07-21 MED ORDER — DOCUSATE SODIUM 100 MG PO CAPS
100.0000 mg | ORAL_CAPSULE | Freq: Every day | ORAL | Status: DC
  Administered 2019-07-22: 100 mg via ORAL
  Filled 2019-07-21: qty 1

## 2019-07-21 MED ORDER — SODIUM CHLORIDE 0.9 % IV SOLN
INTRAVENOUS | Status: DC | PRN
  Administered 2019-07-21: 50 ug/min via INTRAVENOUS

## 2019-07-21 SURGICAL SUPPLY — 46 items
CANISTER SUCT 3000ML PPV (MISCELLANEOUS) ×4 IMPLANT
CATH EMB 4FR 80CM (CATHETERS) ×2 IMPLANT
CLIP VESOCCLUDE MED 24/CT (CLIP) ×4 IMPLANT
CLIP VESOCCLUDE SM WIDE 24/CT (CLIP) ×4 IMPLANT
COVER WAND RF STERILE (DRAPES) ×2 IMPLANT
DERMABOND ADVANCED (GAUZE/BANDAGES/DRESSINGS) ×2
DERMABOND ADVANCED .7 DNX12 (GAUZE/BANDAGES/DRESSINGS) ×2 IMPLANT
DRAIN CHANNEL 15F RND FF W/TCR (WOUND CARE) IMPLANT
DRAPE X-RAY CASS 24X20 (DRAPES) IMPLANT
DRESSING PREVENA PLUS CUSTOM (GAUZE/BANDAGES/DRESSINGS) IMPLANT
DRSG PREVENA PLUS CUSTOM (GAUZE/BANDAGES/DRESSINGS) ×4
ELECT REM PT RETURN 9FT ADLT (ELECTROSURGICAL) ×4
ELECTRODE REM PT RTRN 9FT ADLT (ELECTROSURGICAL) ×2 IMPLANT
EVACUATOR SILICONE 100CC (DRAIN) IMPLANT
GLOVE BIOGEL PI IND STRL 7.5 (GLOVE) ×2 IMPLANT
GLOVE BIOGEL PI INDICATOR 7.5 (GLOVE) ×2
GLOVE SURG SS PI 7.5 STRL IVOR (GLOVE) ×4 IMPLANT
GOWN STRL REUS W/ TWL LRG LVL3 (GOWN DISPOSABLE) ×4 IMPLANT
GOWN STRL REUS W/ TWL XL LVL3 (GOWN DISPOSABLE) ×2 IMPLANT
GOWN STRL REUS W/TWL LRG LVL3 (GOWN DISPOSABLE) ×4
GOWN STRL REUS W/TWL XL LVL3 (GOWN DISPOSABLE) ×2
GRAFT VASC PATCH XENOSURE 1X14 (Vascular Products) ×2 IMPLANT
HEMOSTAT SNOW SURGICEL 2X4 (HEMOSTASIS) ×2 IMPLANT
KIT BASIN OR (CUSTOM PROCEDURE TRAY) ×4 IMPLANT
KIT DRSG PREVENA PLUS 7DAY 125 (MISCELLANEOUS) ×2 IMPLANT
KIT PREVENA INCISION MGT 13 (CANNISTER) ×2 IMPLANT
KIT TURNOVER KIT B (KITS) ×4 IMPLANT
NS IRRIG 1000ML POUR BTL (IV SOLUTION) ×6 IMPLANT
PACK PERIPHERAL VASCULAR (CUSTOM PROCEDURE TRAY) ×4 IMPLANT
PAD ARMBOARD 7.5X6 YLW CONV (MISCELLANEOUS) ×8 IMPLANT
SET COLLECT BLD 21X3/4 12 (NEEDLE) IMPLANT
SET WALTER ACTIVATION W/DRAPE (SET/KITS/TRAYS/PACK) IMPLANT
SPONGE INTESTINAL PEANUT (DISPOSABLE) ×2 IMPLANT
STOPCOCK 4 WAY LG BORE MALE ST (IV SETS) IMPLANT
SUT ETHILON 3 0 PS 1 (SUTURE) IMPLANT
SUT PROLENE 5 0 C 1 24 (SUTURE) ×10 IMPLANT
SUT PROLENE 6 0 BV (SUTURE) ×4 IMPLANT
SUT VIC AB 2-0 CT1 27 (SUTURE) ×2
SUT VIC AB 2-0 CT1 TAPERPNT 27 (SUTURE) ×2 IMPLANT
SUT VIC AB 3-0 SH 27 (SUTURE) ×2
SUT VIC AB 3-0 SH 27X BRD (SUTURE) ×2 IMPLANT
SUT VICRYL 4-0 PS2 18IN ABS (SUTURE) ×4 IMPLANT
TOWEL GREEN STERILE (TOWEL DISPOSABLE) ×4 IMPLANT
TUBING EXTENTION W/L.L. (IV SETS) IMPLANT
UNDERPAD 30X30 (UNDERPADS AND DIAPERS) ×4 IMPLANT
WATER STERILE IRR 1000ML POUR (IV SOLUTION) ×4 IMPLANT

## 2019-07-21 NOTE — Interval H&P Note (Signed)
History and Physical Interval Note:  07/21/2019 11:53 AM  [name]  has presented today for surgery, with the diagnosis of CRITICAL LIMB ISCHEMIA RIGHT LEG.  The various methods of treatment have been discussed with the patient and family. After consideration of risks, benefits and other options for treatment, the patient has consented to  Procedure(s): RIGHT ENDARTERECTOMY FEMORAL WITH VEIN PATCH ANGIOPLASTY (Right) as a surgical intervention.  The patient's history has been reviewed, patient examined, no change in status, stable for surgery.  I have reviewed the patient's chart and labs.  Questions were answered to the patient's satisfaction.     Annamarie Major

## 2019-07-21 NOTE — Discharge Instructions (Signed)
 Vascular and Vein Specialists of [facility]  Discharge instructions  Lower Extremity Bypass Surgery  Please refer to the following instruction for your post-procedure care. Your surgeon or physician assistant will discuss any changes with you.  Activity  You are encouraged to walk as much as you can. You can slowly return to normal activities during the month after your surgery. Avoid strenuous activity and heavy lifting until your doctor tells you it's OK. Avoid activities such as vacuuming or swinging a golf club. Do not drive until your doctor give the OK and you are no longer taking prescription pain medications. It is also normal to have difficulty with sleep habits, eating and bowel movement after surgery. These will go away with time.  Bathing/Showering  You may shower after you go home. Do not soak in a bathtub, hot tub, or swim until the incision heals completely.  Incision Care  Clean your incision with mild soap and water. Shower every day. Pat the area dry with a clean towel. You do not need a bandage unless otherwise instructed. Do not apply any ointments or creams to your incision. If you have open wounds you will be instructed how to care for them or a visiting nurse may be arranged for you. If you have staples or sutures along your incision they will be removed at your post-op appointment. You may have skin glue on your incision. Do not peel it off. It will come off on its own in about one week. If you have a great deal of moisture in your groin, use a gauze help keep this area dry.  Diet  Resume your normal diet. There are no special food restrictions following this procedure. A low fat/ low cholesterol diet is recommended for all patients with vascular disease. In order to heal from your surgery, it is CRITICAL to get adequate nutrition. Your body requires vitamins, minerals, and protein. Vegetables are the best source of vitamins and minerals. Vegetables also provide the  perfect balance of protein. Processed food has little nutritional value, so try to avoid this.  Medications  Resume taking all your medications unless your doctor or nurse practitioner tells you not to. If your incision is causing pain, you may take over-the-counter pain relievers such as acetaminophen (Tylenol). If you were prescribed a stronger pain medication, please aware these medication can cause nausea and constipation. Prevent nausea by taking the medication with a snack or meal. Avoid constipation by drinking plenty of fluids and eating foods with high amount of fiber, such as fruits, vegetables, and grains. Take Colase 100 mg (an over-the-counter stool softener) twice a day as needed for constipation. Do not take Tylenol if you are taking prescription pain medications.  Follow Up  Our office will schedule a follow up appointment 2-3 weeks following discharge.  Please call us immediately for any of the following conditions  Severe or worsening pain in your legs or feet while at rest or while walking Increase pain, redness, warmth, or drainage (pus) from your incision site(s) Fever of 101 degree or higher The swelling in your leg with the bypass suddenly worsens and becomes more painful than when you were in the hospital If you have been instructed to feel your graft pulse then you should do so every day. If you can no longer feel this pulse, call the office immediately. Not all patients are given this instruction.  Leg swelling is common after leg bypass surgery.  The swelling should improve over a few months   following surgery. To improve the swelling, you may elevate your legs above the level of your heart while you are sitting or resting. Your surgeon or physician assistant may ask you to apply an ACE wrap or wear compression (TED) stockings to help to reduce swelling.  Reduce your risk of vascular disease  Stop smoking. If you would like help call [facility] at 1-800-QUIT-NOW  (1-800-784-8669) or [place] at [phone].  Manage your cholesterol Maintain a desired weight Control your diabetes weight Control your diabetes Keep your blood pressure down  If you have any questions, please call the office at [phone]   

## 2019-07-21 NOTE — Anesthesia Preprocedure Evaluation (Addendum)
Anesthesia Evaluation  Patient identified by MRN, date of birth, ID band Patient awake    Reviewed: Allergy & Precautions, NPO status , Patient's Chart, lab work & pertinent test results, reviewed documented beta blocker date and time   Airway Mallampati: II  TM Distance: >3 FB Neck ROM: Full    Dental no notable dental hx. (+) Caps, Teeth Intact, Dental Advisory Given   Pulmonary pneumonia, resolved, former smoker,    Pulmonary exam normal breath sounds clear to auscultation       Cardiovascular hypertension, Pt. on medications and Pt. on home beta blockers + Peripheral Vascular Disease  Normal cardiovascular exam+ Valvular Problems/Murmurs  Rhythm:Regular Rate:Normal  Critical ischemia right leg Bilateral carotid stenosis <50%   Neuro/Psych negative neurological ROS  negative psych ROS   GI/Hepatic Neg liver ROS, hiatal hernia, GERD  Controlled and Medicated,  Endo/Other  diabetes, Well Controlled, Type 2, Oral Hypoglycemic AgentsHyperlipidemia  Renal/GU negative Renal ROS  negative genitourinary   Musculoskeletal  (+) Arthritis , Osteoarthritis,    Abdominal   Peds  Hematology   Anesthesia Other Findings   Reproductive/Obstetrics                           Anesthesia Physical Anesthesia Plan  ASA: III  Anesthesia Plan: General   Post-op Pain Management:    Induction: Intravenous  PONV Risk Score and Plan: 4 or greater and Ondansetron and Treatment may vary due to age or medical condition  Airway Management Planned: Oral ETT  Additional Equipment: Arterial line  Intra-op Plan:   Post-operative Plan: Extubation in OR  Informed Consent: I have reviewed the patients History and Physical, chart, labs and discussed the procedure including the risks, benefits and alternatives for the proposed anesthesia with the patient or authorized representative who has indicated his/her  understanding and acceptance.     Dental advisory given  Plan Discussed with: CRNA and Surgeon  Anesthesia Plan Comments:         Anesthesia Quick Evaluation

## 2019-07-21 NOTE — Op Note (Signed)
Patient name: [name] MRN: [id number] DOB: 1941/02/17 Sex: female  07/21/2019 Pre-operative Diagnosis: Right toe gangrene Post-operative diagnosis:  Same Surgeon:  Annamarie Major Assistants: Laurence Slate Procedure:   #1: Right external iliac, common femoral, profundofemoral, and superficial femoral endarterectomy with bovine pericardial patch angioplasty   #2: Praveena wound VAC Anesthesia: General Blood Loss: 150 cc Specimens: None  Findings: Heavily calcified near occlusive plaque in the common femoral artery extending into the profundofemoral artery.  Extensive endarterectomy was performed and a 8 cm bovine pericardial patch was utilized  Indications: I saw the patient yesterday in the office.  She had gangrenous changes to her first 3 toes on the right and ulcer on the fourth toe.  She had recently undergone angiography which demonstrated near occlusive disease in her common femoral artery and profundofemoral artery as well as a high-grade lesion or superficial femoral artery.  We elected to come to the operating room today for endarterectomy of the common femoral lesion.  She will have percutaneous intervention of the superficial femoral artery lesion in the immediate future.  Procedure:  The patient was identified in the holding area and taken to [place] [facility]  The patient was then placed supine on the table. general anesthesia was administered.  The patient was prepped and draped in the usual sterile fashion.  A time out was called and antibiotics were administered.  A longitudinal incision was made in the right groin.  Cautery was used about subcutaneous tissue exposing the femoral artery which was heavily calcified throughout.  I exposed the common femoral artery throughout its length as well as the first 3 cm of the superficial femoral artery and 4 cm of the profundofemoral artery.  I also exposed the distal external iliac artery and divided the crossing iliac vein.  Once I had  adequate exposure the patient was fully heparinized.  After the heparin circulated, vascular clamps were used to occlude the vessels and a #11 blade was used to make an arteriotomy in the common femoral artery.  This was extended up to the inguinal ligament and then down onto the superficial femoral artery for approximately 2 cm.  A Marlene Bast elevator was used to perform endarterectomy.  I was able to get a good distal endpoint in the superficial femoral artery which I tacked down with two 6-0 Prolene sutures.  I was also able to endarterectomized the origin of the profundofemoral artery which was a nearly occlusive lesion.  A good distal endpoint was also obtained here.  I then inserted a Fogarty balloon up into the external iliac artery and then proceeded to perform endarterectomy using hemostats to remove as much plaque as possible.  After this was done there was excellent inflow.  I made sure all embolic debris was removed.  A bovine pericardial patch was used to perform patch angioplasty.  The length of the patch was approximately 8 cm.  Prior to completion the appropriate flushing maneuvers were performed.  There was good backbleeding from the superficial femoral and profundofemoral artery as well as good inflow.  Once the anastomosis was completed there was a palpable pulse within the common femoral artery.  There were brisk Doppler signals within the superficial femoral and profundofemoral artery.  50 mg of protamine was then used to reverse the heparin.  The femoral sheath was reapproximated with 2-0 Vicryl.  The subcutaneous tissue was then closed with additional layers of 3-0 Vicryl, and 4-0 Vicryl subcuticular closure was performed.  A Praveena  wound VAC was then placed.  There were no immediate complications.   Disposition: To PACU stable.   Theotis Burrow, M.D., [facility] [place] Office: [phone] Pager:  [phone]

## 2019-07-21 NOTE — Anesthesia Procedure Notes (Signed)
Procedure Name: Intubation Date/Time: 07/21/2019 12:35 PM Performed by: Orlie Dakin, CRNA Pre-anesthesia Checklist: Patient identified, Emergency Drugs available, Suction available and Patient being monitored Patient Re-evaluated:Patient Re-evaluated prior to induction Oxygen Delivery Method: Circle system utilized Preoxygenation: Pre-oxygenation with 100% oxygen Induction Type: IV induction Ventilation: Mask ventilation without difficulty Laryngoscope Size: Miller and 3 Grade View: Grade I Tube type: Oral Tube size: 7.0 mm Number of attempts: 1 Airway Equipment and Method: Stylet Placement Confirmation: ETT inserted through vocal cords under direct vision,  breath sounds checked- equal and bilateral and positive ETCO2 Secured at: 21 cm Tube secured with: Tape Dental Injury: Teeth and Oropharynx as per pre-operative assessment  Comments: 4x4s bite block used.

## 2019-07-21 NOTE — Anesthesia Procedure Notes (Signed)
Arterial Line Insertion Start/End10/27/2020 11:30 AM, 07/21/2019 11:40 AM Performed by: [name], CRNA  Patient location: Pre-op. Preanesthetic checklist: patient identified, IV checked, site marked, risks and benefits discussed, surgical consent, monitors and equipment checked, pre-op evaluation, timeout performed and anesthesia consent Lidocaine 1% used for infiltration Left, radial was placed Catheter size: 20 Fr Hand hygiene performed  and maximum sterile barriers used   Attempts: 2 Procedure performed without using ultrasound guided technique. Following insertion, dressing applied. Post procedure assessment: normal and unchanged

## 2019-07-21 NOTE — Transfer of Care (Signed)
Immediate Anesthesia Transfer of Care Note  Patient: [name]  Procedure(s) Performed: RIGHT ENDARTERECTOMY FEMORAL WITH PATCH ANGIOPLASTY (Right Groin) Patch Angioplasty Right Femoral Artery (Right ) Application Of Prevena Wound Vac Right Groin (Right )  Patient Location: PACU  Anesthesia Type:General  Level of Consciousness: drowsy and patient cooperative  Airway & Oxygen Therapy: Patient Spontanous Breathing  Post-op Assessment: Report given to RN  Post vital signs: Reviewed and stable  Last Vitals:  Vitals Value Taken Time  BP 104/43 07/21/19 1525  Temp    Pulse 96 07/21/19 1525  Resp 26 07/21/19 1525  SpO2 95 % 07/21/19 1525  Vitals shown include unvalidated device data.  Last Pain:  Vitals:   07/21/19 1012  TempSrc: Oral  PainSc: 0-No pain         Complications: No apparent anesthesia complications

## 2019-07-21 NOTE — Progress Notes (Signed)
Patient received from PACU. Oriented to unit and room. CHG wipes applied. VSS. Call bell within reach. Will continue to monitor.   Arletta Bale, RN

## 2019-07-21 NOTE — Progress Notes (Signed)
Pharmacy Antibiotic Note  [name] is a 78 y.o. female admitted on 07/21/2019 with critical limb ischemia of right leg.  Pt is S/P right femoral endaretectomy with right vein patch angioplasty this afternoon. Pharmacy has been consulted for vancomycin dosing for 24 hrs of surgical prophylaxis. Patient rec'd cefazolin 2 gm IV pre-op.  Medical history includes: diabetes, HTN, HLD, former tobacco use  WBC 17.9, afebrile, Scr 0.70, CrCl 52 ml/min  Plan: Vancomycin 1 gram IV Q 12 hrs X 2 doses Monitor renal function, WBC, temp  Height: 5' 4.5" (163.8 cm)(Simultaneous filing. User may not have seen previous data.) Weight: 139 lb 12.8 oz (63.4 kg)(Simultaneous filing. User may not have seen previous data.) IBW/kg (Calculated) : 55.85  Temp (24hrs), Avg:97.9 F (36.6 C), Min:97.2 F (36.2 C), Max:98.4 F (36.9 C)  Recent Labs  Lab 07/16/19 0951 07/21/19 1030 07/21/19 1358 07/21/19 1544  WBC 15.9* 17.3*  --  17.9*  CREATININE  --  0.82 0.40* 0.70    Estimated Creatinine Clearance: 52 mL/min (by C-G formula based on SCr of 0.7 mg/dL).    Allergies  Allergen Reactions  . Demerol [Meperidine]     Delusional   . Scopolamine     Delusional     Antimicrobials this admission: 10/27 Cefazolin X 1 pre-op 10/27 Zosyn >>   Microbiology results: 10/26 COVID: negative  Thank you for allowing pharmacy to be a part of this patient's care.  Gillermina Hu, PharmD, BCPS, [facility] Clinical Pharmacist 07/21/2019 5:07 PM

## 2019-07-22 ENCOUNTER — Telehealth: Payer: Self-pay | Admitting: *Deleted

## 2019-07-22 ENCOUNTER — Other Ambulatory Visit: Payer: Self-pay | Admitting: Endocrinology

## 2019-07-22 ENCOUNTER — Encounter (HOSPITAL_COMMUNITY): Payer: Self-pay | Admitting: Surgery

## 2019-07-22 LAB — CBC
HCT: 29.9 % — ABNORMAL LOW (ref 36.0–46.0)
Hemoglobin: 9.6 g/dL — ABNORMAL LOW (ref 12.0–15.0)
MCH: 28.9 pg (ref 26.0–34.0)
MCHC: 32.1 g/dL (ref 30.0–36.0)
MCV: 90.1 fL (ref 80.0–100.0)
Platelets: 473 10*3/uL — ABNORMAL HIGH (ref 150–400)
RBC: 3.32 MIL/uL — ABNORMAL LOW (ref 3.87–5.11)
RDW: 12.4 % (ref 11.5–15.5)
WBC: 18.5 10*3/uL — ABNORMAL HIGH (ref 4.0–10.5)
nRBC: 0 % (ref 0.0–0.2)

## 2019-07-22 LAB — GLUCOSE, CAPILLARY
Glucose-Capillary: 187 mg/dL — ABNORMAL HIGH (ref 70–99)
Glucose-Capillary: 166 mg/dL — ABNORMAL HIGH (ref 70–99)

## 2019-07-22 LAB — BASIC METABOLIC PANEL
Anion gap: 12 (ref 5–15)
BUN: 14 mg/dL (ref 8–23)
CO2: 24 mmol/L (ref 22–32)
Calcium: 9.2 mg/dL (ref 8.9–10.3)
Chloride: 104 mmol/L (ref 98–111)
Creatinine, Ser: 0.77 mg/dL (ref 0.44–1.00)
GFR calc Af Amer: >60 mL/min (ref >60)
GFR calc non Af Amer: >60 mL/min (ref >60)
Glucose, Bld: 169 mg/dL — ABNORMAL HIGH (ref 70–99)
Potassium: 4.1 mmol/L (ref 3.5–5.1)
Sodium: 140 mmol/L (ref 135–145)

## 2019-07-22 MED ORDER — SULFAMETHOXAZOLE-TRIMETHOPRIM 800-160 MG PO TABS: 1.0000 | ORAL_TABLET | Freq: Two times a day (BID) | ORAL | 0 refills | Status: AC

## 2019-07-22 MED ORDER — OXYCODONE HCL 5 MG PO TABS: 5.0000 mg | ORAL_TABLET | Freq: Four times a day (QID) | ORAL | 0 refills | Status: DC | PRN

## 2019-07-22 NOTE — Plan of Care (Signed)
Continue to monitor

## 2019-07-22 NOTE — Plan of Care (Signed)
Discharge to home °

## 2019-07-22 NOTE — Progress Notes (Signed)
BP from A-line 189/45 - 176/ 45 mmHg, HR 90s, sinus rhythm on monitor. after Labetalol 10 mg given, BP 120/41-149/39 mmHg, afebrile. Denied pain on right groin, with wound vac sealed intact, no leaking, no drainage. Patent bilateral legs Dorsalis Pedis pulses and posterior Tibial pulses with doppler.  Pt ambulated well with standby assisted to restroom. No acute distress noted. Continue to monitor.   Kennyth Lose, RN

## 2019-07-22 NOTE — Progress Notes (Signed)
OT Cancellation Note  Patient Details Name: KAPRI NERO MRN: [id number] DOB: 09-13-1941   Cancelled Treatment:    Reason Eval/Treat Not Completed: OT screened, no needs identified, will sign off. Per conversation with PT and chart review, pt appears to be at/close to baseline with basic ADLs. Good family support at home. Will sign off.   Tyrone Schimke, OT Acute Rehabilitation Services Pager: [phone] Office: [phone]  07/22/2019, 9:39 AM

## 2019-07-22 NOTE — Telephone Encounter (Signed)
I left message on Dr. Leigh Aurora mobile phone with Dr. Trula Slade contact information and pt MRN and DOB, with request for confirmation this call had been received.

## 2019-07-22 NOTE — Progress Notes (Signed)
IV and telemetry discontinued. CCMD notified. Discharge instructions reviewed with patient. All questions answered.   K. Starr Shawntel Farnworth, RN 

## 2019-07-22 NOTE — Anesthesia Postprocedure Evaluation (Signed)
Anesthesia Post Note  Patient: Elpidia H Heyde  Procedure(s) Performed: RIGHT ENDARTERECTOMY FEMORAL WITH PATCH ANGIOPLASTY (Right Groin) Patch Angioplasty Right Femoral Artery (Right ) Application Of Prevena Wound Vac Right Groin (Right )     Patient location during evaluation: PACU Anesthesia Type: General Level of consciousness: awake and alert Pain management: pain level controlled Vital Signs Assessment: post-procedure vital signs reviewed and stable Respiratory status: spontaneous breathing, nonlabored ventilation, respiratory function stable and patient connected to nasal cannula oxygen Cardiovascular status: blood pressure returned to baseline and stable Postop Assessment: no apparent nausea or vomiting Anesthetic complications: no    Last Vitals:  Vitals:   07/22/19 0830 07/22/19 0943  BP: (!) 122/50 (!) 119/52  Pulse: (!) 117 97  Resp: 15 (!) 24  Temp: (!) 36.2 C   SpO2: 100%     Last Pain:  Vitals:   07/22/19 0830  TempSrc: Oral  PainSc:                  Koleton Duchemin

## 2019-07-22 NOTE — Telephone Encounter (Signed)
Dr. Trula Slade requested to speak with Dr. Jacqualyn Posey concerning pt. I informed Dr. Trula Slade, that Dr. Jacqualyn Posey was at the [facility], I would call with his contact number and pt information.

## 2019-07-22 NOTE — Progress Notes (Signed)
  Progress Note    07/22/2019 7:42 AM 1 Day Post-Op  Subjective:  Concerned about R SFA lesion but feeling well enough to go home   Vitals:   07/22/19 0300 07/22/19 0342  BP: (!) 113/52 (!) 119/53  Pulse: 84 87  Resp: (!) 24 16  Temp:  (!) 97.5 F (36.4 C)  SpO2: 96% 97%   Physical Exam: Lungs:  Non labored Incisions:  R groin prevena in place Extremities:  grangrene toes R foot; R PT and DP by doppler Abdomen:  Soft Neurologic: A&O  CBC    Component Value Date/Time   WBC 18.5 (H) 07/22/2019 0351   RBC 3.32 (L) 07/22/2019 0351   HGB 9.6 (L) 07/22/2019 0351   HGB 11.8 07/16/2019 0951   HCT 29.9 (L) 07/22/2019 0351   HCT 35.0 07/16/2019 0951   PLT 473 (H) 07/22/2019 0351   PLT 522 (H) 07/16/2019 0951   MCV 90.1 07/22/2019 0351   MCV 89 07/16/2019 0951   MCH 28.9 07/22/2019 0351   MCHC 32.1 07/22/2019 0351   RDW 12.4 07/22/2019 0351   RDW 11.8 07/16/2019 0951    BMET    Component Value Date/Time   NA 140 07/22/2019 0351   NA 138 06/09/2019 0841   K 4.1 07/22/2019 0351   CL 104 07/22/2019 0351   CO2 24 07/22/2019 0351   GLUCOSE 169 (H) 07/22/2019 0351   BUN 14 07/22/2019 0351   BUN 19 06/09/2019 0841   CREATININE 0.77 07/22/2019 0351   CALCIUM 9.2 07/22/2019 0351   GFRNONAA >60 07/22/2019 0351   GFRAA >60 07/22/2019 0351    INR    Component Value Date/Time   INR 1.1 07/21/2019 1030     Intake/Output Summary (Last 24 hours) at 07/22/2019 0742 Last data filed at 07/22/2019 0645 Gross per 24 hour  Intake 1287.19 ml  Output 103 ml  Net 1184.19 ml     Assessment/Plan:  78 y.o. female is s/p R iliofemoral endarterectomy with bovine patch angioplasty 1 Day Post-Op   RLE well perfused with peripheral doppler signals Continue prevena vac for 1 week Ok for discharge today; follow up with Dr. Trula Slade in 2 weeks No indication for toe amp at this time Follow up with Dr. Gwenlyn Found next week for RLE arteriogram   Dagoberto Ligas, PA-C [facility] [phone] 07/22/2019 7:42 AM

## 2019-07-22 NOTE — Telephone Encounter (Signed)
Manuela Schwartz, CMA states Dr. Jacqualyn Posey says he got my message.

## 2019-07-22 NOTE — TOC Transition Note (Signed)
Transition of Care [facility]) - CM/SW Discharge Note [name] RN, BSN Transitions of Care Unit 4E- RN Case Manager [phone]   Patient Details  Name: [name] MRN: [id number] Date of Birth: [birth year]  Transition of Care [facility]) CM/SW Contact:  Dawayne Patricia, RN Phone Number: 07/22/2019, 11:34 AM   Clinical Narrative:    Pt stable for transition home today, pt to return home with prevena incisional wound VAC- bedside RN to provide instruction on removal of VAC. No HH or DME needs.    Final next level of care: Home/Self Care Barriers to Discharge: No Barriers Identified   Patient Goals and CMS Choice     Choice offered to / list presented to : NA  Discharge Placement        Home/ self care              Discharge Plan and Services                DME Arranged: N/A DME Agency: NA       HH Arranged: NA HH Agency: NA        Social Determinants of Health (SDOH) Interventions     Readmission Risk Interventions Readmission Risk Prevention Plan 07/22/2019  Post Dischage Appt Complete  Medication Screening Complete  Transportation Screening Complete  Some recent data might be hidden

## 2019-07-22 NOTE — Discharge Summary (Signed)
Physician Discharge Summary   Patient ID: [name] [id number] 78 y.o. [birth year]  Admit date: 07/21/2019  Discharge date and time: 07/22/19   Admitting Physician: [name], MD   Discharge Physician: same  Admission Diagnoses: CRITICAL LIMB ISCHEMIA RIGHT LEG  Discharge Diagnoses: same  Admission Condition: fair  Discharged Condition: fair  Indication for Admission: gangrene toes R foot  Hospital Course: [name] is a 78 year old female who came in as an outpatient with critical limb ischemia of right lower extremity and underwent right iliofemoral endarterectomy with bovine patch angioplasty by Dr. Trula Slade on 07/21/2019.  She tolerated the procedure well and was admitted to the hospital overnight.  POD #1 she has a DP and PT signal by Doppler.  Prevena incisional VAC will remain on her right groin incision for 1 week.  She will follow-up with Dr. Alvester Chou in 1 week for right lower extremity angiography.  She can follow-up with Dr. Trula Slade in about 2 weeks for incision check and to reassess gangrenous toes.  She will be prescribed 2 to 3 days of narcotic pain medication for continued postoperative pain control.  She will also be prescribed 1 week of a Bactrim regimen.  Discharge instructions were reviewed with the patient and she voiced her understanding.  She will be discharged home this morning in stable condition.  Consults: None  Treatments: surgery: Right iliofemoral endarterectomy with bovine patch angioplasty by Dr. Trula Slade on 07/21/2019  Discharge Exam: See progress note 07/22/2019 Vitals:   07/22/19 0342 07/22/19 0830  BP: (!) 119/53 (!) 122/50  Pulse: 87 (!) 117  Resp: 16 15  Temp: (!) 97.5 F (36.4 C) (!) 97.2 F (36.2 C)  SpO2: 97% 100%     Disposition: Discharge disposition: 01-Home or Self Care       Patient Instructions:  Allergies as of 07/22/2019      Reactions   Demerol [meperidine]    Delusional    Scopolamine    Delusional       Medication List    TAKE these medications   acetaminophen 500 MG tablet Commonly known as: TYLENOL Take 1,000 mg by mouth every 6 (six) hours as needed for moderate pain or headache.   aspirin 81 MG tablet Take 81 mg by mouth every evening.   atorvastatin 20 MG tablet Commonly known as: LIPITOR Take 1 tablet by mouth once daily What changed: when to take this   calcium carbonate 500 MG chewable tablet Commonly known as: TUMS - dosed in mg elemental calcium Chew 2 tablets by mouth daily as needed for indigestion or heartburn.   CINNAMON PO Take 1,000 mg by mouth 3 (three) times daily with meals.   diltiazem 240 MG 24 hr capsule Commonly known as: Cartia XT Take 1 capsule (240 mg total) by mouth daily.   fish oil-omega-3 fatty acids 1000 MG capsule Take 1 g by mouth 4 (four) times a week. Sun, Mon, Wed, Fri at night   glipiZIDE 5 MG 24 hr tablet Commonly known as: GLUCOTROL XL TAKE 1 TABLET BY MOUTH ONCE DAILY WITH BREAKFAST   hydrochlorothiazide 25 MG tablet Commonly known as: HYDRODIURIL Take 1 tablet (25 mg total) by mouth daily.   Invokana 100 MG Tabs tablet Generic drug: canagliflozin TAKE 1 TABLET BY MOUTH BEFORE BREAKFAST What changed: See the new instructions.   Magnesium 250 MG Tabs Take 250 mg by mouth daily with supper.   metFORMIN 1000 MG tablet Commonly known as: GLUCOPHAGE TAKE 1 TABLET BY MOUTH  TWICE DAILY WITH A MEAL What changed: See the new instructions.   metoprolol tartrate 100 MG tablet Commonly known as: LOPRESSOR Take half tab in the morning and a whole tab in the evening. What changed:   how much to take  how to take this  when to take this  additional instructions   nitroGLYCERIN 0.2 mg/hr patch Commonly known as: Nitro-Dur Place 1 patch (0.2 mg total) onto the skin daily. Apply behind right toe daily   omeprazole 20 MG capsule Commonly known as: PRILOSEC Take 20 mg by mouth 2 (two) times a week. Monday and Friday  evenings   oxyCODONE 5 MG immediate release tablet Commonly known as: Oxy IR/ROXICODONE Take 1 tablet (5 mg total) by mouth every 6 (six) hours as needed for moderate pain.   Ozempic (0.25 or 0.5 MG/DOSE) 2 MG/1.5ML Sopn Generic drug: Semaglutide(0.25 or 0.5MG /DOS) INJECT 0.5MG  INTO THE SKIN ONCE A WEEK What changed: See the new instructions.   sulfamethoxazole-trimethoprim 800-160 MG tablet Commonly known as: BACTRIM DS Take 1 tablet by mouth 2 (two) times daily for 7 days.   telmisartan 80 MG tablet Commonly known as: MICARDIS Take 1 tablet (80 mg total) by mouth daily. What changed:   how much to take  when to take this   vitamin B-12 1000 MCG tablet Commonly known as: CYANOCOBALAMIN Take 1,000 mcg by mouth daily with supper.   vitamin C 1000 MG tablet Take 1,000 mg by mouth daily with supper.      Activity: activity as tolerated Diet: regular diet Wound Care: Remove incisional VAC 1 week postoperatively then keep incision clean and dry  Follow-up with Dr. Trula Slade in 2 weeks. Follow-up with Dr. Alvester Chou in 1 week  Signed: Dagoberto Ligas 07/22/2019 8:40 AM

## 2019-07-22 NOTE — Progress Notes (Signed)
PT Cancellation Note  Patient Details Name: [name] MRN: [id number] DOB: 03/27/1941   Cancelled Treatment:    Reason Eval/Treat Not Completed: PT screened, no needs identified, will sign off. Pt reports she is near her functional baseline, ambulating without difficulty. Pt has family support at home and expresses no concerns for managing mobility. Pt declining PT evaluation at this time. Acute PT signing off 07/22/2019.   Zenaida Niece 07/22/2019, 9:25 AM

## 2019-07-24 ENCOUNTER — Ambulatory Visit (INDEPENDENT_AMBULATORY_CARE_PROVIDER_SITE_OTHER): Payer: Medicare Other | Admitting: Podiatry

## 2019-07-24 ENCOUNTER — Other Ambulatory Visit: Payer: Self-pay

## 2019-07-24 DIAGNOSIS — I96 Gangrene, not elsewhere classified: Secondary | ICD-10-CM | POA: Diagnosis not present

## 2019-07-24 DIAGNOSIS — I739 Peripheral vascular disease, unspecified: Secondary | ICD-10-CM

## 2019-07-24 DIAGNOSIS — I6523 Occlusion and stenosis of bilateral carotid arteries: Secondary | ICD-10-CM | POA: Diagnosis not present

## 2019-07-24 NOTE — Progress Notes (Signed)
Subjective: 78 year old female presents the office today for follow-up evaluation of gangrene to toes on the right foot.  Since I last saw her she been in the wound care center she was able to see vascular surgery.  She underwent a endarterectomy of the common femoral lesion.  She does have a SFA lesion that needs to be angioplastied as well.  I had a discussion with Dr. Trula Slade regards her treatment but ultimately she does need to have amputation of the toes on the right foot.  I asked her to come in today for evaluation.  Denies any drainage or pus.  She is on the antibiotic but overall she says the foot looks better since the procedure.  Denies any fevers or chills.  No chest pain, shortness of breath.  Had angio with Dr. Gwenlyn Found 06/15/2019 Angiographic Data:   1: Abdominal aorta- renal arteries widely patent.  The infrarenal abdominal aorta was patent as well with fluoroscopic calcification 2: Left lower extremity- there was an 80% fairly focal proximal left SFA stenosis.  The mid left SFA was diffusely 70% segmentally stenosed and calcified.  There was a short segment calcified CTO distal left SFA and above-knee popliteal artery reconstituting with two-vessel runoff.  The posterior tibial was occluded. 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  She has both claudication and nonhealing toe wounds.  We will proceed with attempt at distal left SFA CTO intervention.  Objective: AAO x3, NAD Pulses decreased which is unchanged. 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  Wound on the left fourth toe remains concerned to scab over.  No drainage or  pus.  No fluctuation crepitation. No pain with calf compression, swelling, warmth, erythema.  Assessment: Dry gangrene  Plan: -All treatment options discussed with the patient including all alternatives, risks, complications.  -She is scheduled to see Dr. Alvester Chou on Tuesday.  I will discuss with him timing in regards to further intervention of the SFA lesion as well as transmetatarsal amputation.  I discussed the procedure as well as the postoperative course.  I will get in contact with her in regards to timing for surgery.  As of now the toes although gangrenous are stable.  Monitor closely for any signs or symptoms of infection.   Trula Slade DPM

## 2019-07-27 ENCOUNTER — Encounter (HOSPITAL_BASED_OUTPATIENT_CLINIC_OR_DEPARTMENT_OTHER): Payer: Medicare Other | Attending: Internal Medicine | Admitting: Internal Medicine

## 2019-07-27 ENCOUNTER — Other Ambulatory Visit: Payer: Self-pay

## 2019-07-27 DIAGNOSIS — I6529 Occlusion and stenosis of unspecified carotid artery: Secondary | ICD-10-CM | POA: Insufficient documentation

## 2019-07-27 DIAGNOSIS — L97518 Non-pressure chronic ulcer of other part of right foot with other specified severity: Secondary | ICD-10-CM | POA: Diagnosis not present

## 2019-07-27 DIAGNOSIS — I771 Stricture of artery: Secondary | ICD-10-CM | POA: Diagnosis not present

## 2019-07-27 DIAGNOSIS — E11319 Type 2 diabetes mellitus with unspecified diabetic retinopathy without macular edema: Secondary | ICD-10-CM | POA: Insufficient documentation

## 2019-07-27 DIAGNOSIS — E785 Hyperlipidemia, unspecified: Secondary | ICD-10-CM | POA: Insufficient documentation

## 2019-07-27 DIAGNOSIS — I10 Essential (primary) hypertension: Secondary | ICD-10-CM | POA: Diagnosis not present

## 2019-07-27 DIAGNOSIS — L97528 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ity: Secondary | ICD-10-CM | POA: Diagnosis not present

## 2019-07-27 DIAGNOSIS — E1142 Type 2 diabetes mellitus with diabetic polyneuropathy: Secondary | ICD-10-CM | POA: Insufficient documentation

## 2019-07-27 DIAGNOSIS — E1152 Type 2 diabetes mellitus with diabetic peripheral angiopathy with gangrene: Secondary | ICD-10-CM | POA: Insufficient documentation

## 2019-07-27 DIAGNOSIS — S90424A Blister (nonthermal), right lesser toe(s), initial encounter: Secondary | ICD-10-CM | POA: Diagnosis not present

## 2019-07-27 DIAGNOSIS — E119 Type 2 diabetes mellitus without complications: Secondary | ICD-10-CM | POA: Diagnosis not present

## 2019-07-27 DIAGNOSIS — S90425A Blister (nonthermal), left lesser toe(s), initial encounter: Secondary | ICD-10-CM | POA: Diagnosis not present

## 2019-07-27 DIAGNOSIS — E11621 Type 2 diabetes mellitus with foot ulcer: Secondary | ICD-10-CM | POA: Insufficient documentation

## 2019-07-27 DIAGNOSIS — S90421A Blister (nonthermal), right great toe, initial encounter: Secondary | ICD-10-CM | POA: Diagnosis not present

## 2019-07-28 ENCOUNTER — Ambulatory Visit (INDEPENDENT_AMBULATORY_CARE_PROVIDER_SITE_OTHER): Payer: Medicare Other | Admitting: Cardiovascular Disease

## 2019-07-28 ENCOUNTER — Other Ambulatory Visit: Payer: Self-pay

## 2019-07-28 ENCOUNTER — Encounter: Payer: Self-pay | Admitting: Cardiovascular Disease

## 2019-07-28 VITALS — BP 106/57 | HR 94 | Temp 96.8°F | Ht 64.5 in | Wt 138.4 lb

## 2019-07-28 DIAGNOSIS — I739 Peripheral vascular disease, unspecified: Secondary | ICD-10-CM

## 2019-07-28 DIAGNOSIS — I10 Essential (primary) hypertension: Secondary | ICD-10-CM | POA: Diagnosis not present

## 2019-07-28 DIAGNOSIS — I6523 Occlusion and stenosis of bilateral carotid arteries: Secondary | ICD-10-CM

## 2019-07-28 MED ORDER — SODIUM CHLORIDE 0.9% FLUSH: 3.0000 mL | Freq: Two times a day (BID) | INTRAVENOUS | Status: DC

## 2019-07-28 NOTE — Assessment & Plan Note (Signed)
[name] has critical limb ischemia with a failed attempt at left SFA intervention by myself 06/15/2019.  She does have gangrenous right first and second toes.  She underwent right common femoral endarterectomy with patch angioplasty by [name] on 07/21/2019 with removal of the entire plaque.  She saw [name] back in the office last Thursday who thought that her toes were stable.  I am planning on performing orbital atherectomy and drug-coated balloon angioplasty plus or minus stenting of right tibioperoneal trunk on Monday, October 9 for limb salvage prior to right TMA.

## 2019-07-28 NOTE — Patient Instructions (Addendum)
    [name] [place] [address] Dept: [phone] Loc: [place]  07/28/2019  You are scheduled for a Peripheral Angiogram on Monday, November 9 with Dr. [name].  1. Please arrive at the [facility] (Main Entrance A) at [facility]: [address] [place], [place] [address] at 9:30 AM (This time is two hours before your procedure to ensure your preparation). Free valet parking service is available.   Special note: Every effort is made to have your procedure done on time. Please understand that emergencies sometimes delay scheduled procedures.  2. Diet: Do not eat solid foods after midnight.  The patient may have clear liquids until 5am upon the day of the procedure.  3. Labs: You will need to have blood drawn today.  4. Medication instructions in preparation for your procedure:  DO NOT TAKE METFORMIN THE 9TH, 10TH OR 11TH. RESTART ON 12TH. DO NOT TAKE INVOKANA THE MORNING OF PROCEDURE. DO NOT TAKE OZEMPIC THE MORNING OF PROCEDURE.  On the morning of your procedure, take your ASPIRIN and any morning medicines NOT listed above.  You may use sips of water.  5. Plan for one night stay--bring personal belongings. 6. Bring a current list of your medications and current insurance cards. 7. You MUST have a responsible person to drive you home. 8. Someone MUST be with you the first 24 hours after you arrive home or your discharge will be delayed. 9. Please wear clothes that are easy to get on and off and wear slip-on shoes.  Thank you for allowing [place] to care for you!   -- [facility]  You will have to have a Covid Test prior to your procedure on November 5th @ 12:05pm. Please arrive 10 minutes early.   Your physician has requested that you have a lower or upper extremity arterial duplex. This test is an ultrasound  of the arteries in the legs or arms. It looks at arterial blood flow in the legs and arms. Allow one hour for Lower and Upper Arterial scans. There are no restrictions or special instructions  Your physician recommends that you schedule a follow-up appointment in: 2 weeks after procedure on 08/03/19.

## 2019-07-28 NOTE — H&P (View-Only) (Signed)
07/28/2019 [name]   [birth year]  [id number]  Primary Physician [name], MD Primary Cardiologist: [name] MD [name], [place]  HPI:  [name] is a 78 y.o.  mildly overweight widowed Caucasian female mother of 75, grandmother 7 grandchildren referred by Dr. Jacqualyn Posey, her podiatrist for critical limb ischemia.I last saw her in the office  06/02/2019 . She does have a history of treated hypertension, diabetes and hyperlipidemia. She is never had a heart attack or stroke. She denies chest pain or shortness of breath. 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 She has some local trauma related to socks over Christmas and has developed ischemic appearing wounds on her right second and third toes with recent Dopplers performed 10/09/2018 revealing a right ABI 0.75, left 2.79 with moderately severe right SFA stenosis and an occluded right posterior tibial artery. She is seeing Dr. Jacqualyn Posey back regularly for aggressive local wound care.   Her wounds continue to heal on her right and left second toes. These sores are probably a result of local trauma. Recent ABIs are in the 0.6  range with a high-grade right SFA stenosis and an occluded left popliteal with tibial vessel disease as well. She does complain of some claudication.  She wishes to proceed with endovascular therapy for wound healing and lifestyle limiting claudication.  I performed angiography on her 06/15/2019 with a failed attempt at crossing the left SFA CTO.  She has one-vessel runoff bilaterally.  She did develop gangrenous right first and second toes.  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 Trula Slade who performed right common femoral endarterectomy with patch angioplasty 07/21/2019 with excellent result.  She did see Dr. Jacqualyn Posey back in the office  late last week who thought her wounds were stable.  The pain in her feet has improved since her endarterectomy.  The plan is to perform orbital atherectomy and PTA of her calcified right SFA as well as intervention on her right tibioperoneal trunk prior to right TMA by Dr. Earleen Newport for limb salvage.    Current Meds  Medication Sig  . acetaminophen (TYLENOL) 500 MG tablet Take 1,000 mg by mouth every 6 (six) hours as needed for moderate pain or headache.  . Ascorbic Acid (VITAMIN C) 1000 MG tablet Take 1,000 mg by mouth daily with supper.   Marland Kitchen aspirin 81 MG tablet Take 81 mg by mouth every evening.   Marland Kitchen atorvastatin (LIPITOR) 20 MG tablet Take 1 tablet by mouth once daily (Patient taking differently: Take 20 mg by mouth every evening. )  . calcium carbonate (TUMS - DOSED IN MG ELEMENTAL CALCIUM) 500 MG chewable tablet Chew 2 tablets by mouth daily as needed for indigestion or heartburn.  Marland Kitchen CINNAMON PO Take 1,000 mg by mouth 3 (three) times daily with meals.   Marland Kitchen diltiazem (CARTIA XT) 240 MG 24 hr capsule Take 1 capsule (240 mg total) by mouth daily.  . fish oil-omega-3 fatty acids 1000 MG capsule Take 1 g by mouth 4 (four) times a week. Sun, Mon, Wed, Fri at night  . glipiZIDE (GLUCOTROL XL) 5 MG 24 hr tablet Take 1 tablet by mouth once daily with breakfast  . hydrochlorothiazide (HYDRODIURIL) 25 MG tablet Take 1 tablet (25 mg total) by mouth daily.  . INVOKANA 100 MG TABS tablet TAKE 1 TABLET BY MOUTH BEFORE BREAKFAST  . Magnesium  250 MG TABS Take 250 mg by mouth daily with supper.  . metFORMIN (GLUCOPHAGE) 1000 MG tablet TAKE 1 TABLET BY MOUTH TWICE DAILY WITH A MEAL (Patient taking differently: Take 1,000 mg by mouth 2 (two) times daily. )  . metoprolol (LOPRESSOR) 100 MG tablet Take half tab in the morning and a whole tab in the evening. (Patient taking differently: Take 50-100 mg by mouth See admin instructions. Take 50 mg in the morning and 100 mg in the evening)  . nitroGLYCERIN (NITRO-DUR) 0.2  mg/hr patch Place 1 patch (0.2 mg total) onto the skin daily. Apply behind right toe daily  . omeprazole (PRILOSEC) 20 MG capsule Take 20 mg by mouth 2 (two) times a week. Monday and Friday evenings  . oxyCODONE (OXY IR/ROXICODONE) 5 MG immediate release tablet Take 1 tablet (5 mg total) by mouth every 6 (six) hours as needed for moderate pain.  Marland Kitchen OZEMPIC, 0.25 OR 0.5 MG/DOSE, 2 MG/1.5ML SOPN INJECT 0.5MG  INTO THE SKIN ONCE A WEEK  . sulfamethoxazole-trimethoprim (BACTRIM DS) 800-160 MG tablet Take 1 tablet by mouth 2 (two) times daily for 7 days.  Marland Kitchen telmisartan (MICARDIS) 80 MG tablet Take 1 tablet (80 mg total) by mouth daily. (Patient taking differently: Take 40 mg by mouth every evening. )  . vitamin B-12 (CYANOCOBALAMIN) 1000 MCG tablet Take 1,000 mcg by mouth daily with supper.     Allergies  Allergen Reactions  . Demerol [Meperidine]     Delusional   . Scopolamine     Delusional     Social History   Socioeconomic History  . Marital status: Widowed    Spouse name: Not on file  . Number of children: Not on file  . Years of education: Not on file  . Highest education level: Not on file  Occupational History  . Not on file  Social Needs  . Financial resource strain: Not on file  . Food insecurity    Worry: Not on file    Inability: Not on file  . Transportation needs    Medical: Not on file    Non-medical: Not on file  Tobacco Use  . Smoking status: Former Smoker    Quit date: 03/31/1972    Years since quitting: 47.3  . Smokeless tobacco: Never Used  Substance and Sexual Activity  . Alcohol use: No  . Drug use: No  . Sexual activity: Not on file  Lifestyle  . Physical activity    Days per week: Not on file    Minutes per session: Not on file  . Stress: Not on file  Relationships  . Social Herbalist on phone: Not on file    Gets together: Not on file    Attends religious service: Not on file    Active member of club or organization: Not on file     Attends meetings of clubs or organizations: Not on file    Relationship status: Not on file  . Intimate partner violence    Fear of current or ex partner: Not on file    Emotionally abused: Not on file    Physically abused: Not on file    Forced sexual activity: Not on file  Other Topics Concern  . Not on file  Social History Narrative  . Not on file     Review of Systems: General: negative for chills, fever, night sweats or weight changes.  Cardiovascular: negative for chest pain, dyspnea on exertion, edema, orthopnea, palpitations, paroxysmal nocturnal  dyspnea or shortness of breath Dermatological: negative for rash Respiratory: negative for cough or wheezing Urologic: negative for hematuria Abdominal: negative for nausea, vomiting, diarrhea, bright red blood per rectum, melena, or hematemesis Neurologic: negative for visual changes, syncope, or dizziness All other systems reviewed and are otherwise negative except as noted above.    Blood pressure (!) 106/57, pulse 94, temperature (!) 96.8 F (36 C), height 5' 4.5" (1.638 m), weight 138 lb 6.4 oz (62.8 kg), SpO2 97 %.  General appearance: alert and no distress Neck: no adenopathy, no carotid bruit, no JVD, supple, symmetrical, trachea midline and thyroid not enlarged, symmetric, no tenderness/mass/nodules Lungs: clear to auscultation bilaterally Heart: regular rate and rhythm, S1, S2 normal, no murmur, click, rub or gallop Extremities: extremities normal, atraumatic, no cyanosis or edema Pulses: Absent pedal pulses Skin: Gangrene right first and second toes with some superimposed edema Neurologic: Alert and oriented X 3, normal strength and tone. Normal symmetric reflexes. Normal coordination and gait  EKG not performed today  ASSESSMENT AND PLAN:   PAD (peripheral artery disease) ([place]) Ms. Duchesne has critical limb ischemia with a failed attempt at left SFA intervention by myself 06/15/2019.  She does have gangrenous right  first and second toes.  She underwent right common femoral endarterectomy with patch angioplasty by Dr. Trula Slade on 07/21/2019 with removal of the entire plaque.  She saw Dr. Jacqualyn Posey back in the office last Thursday who thought that her toes were stable.  I am planning on performing orbital atherectomy and drug-coated balloon angioplasty plus or minus stenting of right tibioperoneal trunk on Monday, October 9 for limb salvage prior to right TMA.      [name] MD FACP,FACC,FAHA, [facility] 07/28/2019 2:16 PM

## 2019-07-28 NOTE — Progress Notes (Signed)
07/28/2019 [name]   [birth year]  [id number]  Primary Physician [name], MD Primary Cardiologist: [name] MD [name], [place]  HPI:  [name] is a 78 y.o.  mildly overweight widowed Caucasian female mother of 81, grandmother 7 grandchildren referred by Dr. Jacqualyn Posey, her podiatrist for critical limb ischemia.I last saw her in the office  06/02/2019 . She does have a history of treated hypertension, diabetes and hyperlipidemia. She is never had a heart attack or stroke. She denies chest pain or shortness of breath. 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 She has some local trauma related to socks over Christmas and has developed ischemic appearing wounds on her right second and third toes with recent Dopplers performed 10/09/2018 revealing a right ABI 0.75, left 2.79 with moderately severe right SFA stenosis and an occluded right posterior tibial artery. She is seeing Dr. Jacqualyn Posey back regularly for aggressive local wound care.   Her wounds continue to heal on her right and left second toes. These sores are probably a result of local trauma. Recent ABIs are in the 0.6  range with a high-grade right SFA stenosis and an occluded left popliteal with tibial vessel disease as well. She does complain of some claudication.  She wishes to proceed with endovascular therapy for wound healing and lifestyle limiting claudication.  I performed angiography on her 06/15/2019 with a failed attempt at crossing the left SFA CTO.  She has one-vessel runoff bilaterally.  She did develop gangrenous right first and second toes.  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 Trula Slade who performed right common femoral endarterectomy with patch angioplasty 07/21/2019 with excellent result.  She did see Dr. Jacqualyn Posey back in the office  late last week who thought her wounds were stable.  The pain in her feet has improved since her endarterectomy.  The plan is to perform orbital atherectomy and PTA of her calcified right SFA as well as intervention on her right tibioperoneal trunk prior to right TMA by Dr. Earleen Newport for limb salvage.    Current Meds  Medication Sig  . acetaminophen (TYLENOL) 500 MG tablet Take 1,000 mg by mouth every 6 (six) hours as needed for moderate pain or headache.  . Ascorbic Acid (VITAMIN C) 1000 MG tablet Take 1,000 mg by mouth daily with supper.   Marland Kitchen aspirin 81 MG tablet Take 81 mg by mouth every evening.   Marland Kitchen atorvastatin (LIPITOR) 20 MG tablet Take 1 tablet by mouth once daily (Patient taking differently: Take 20 mg by mouth every evening. )  . calcium carbonate (TUMS - DOSED IN MG ELEMENTAL CALCIUM) 500 MG chewable tablet Chew 2 tablets by mouth daily as needed for indigestion or heartburn.  Marland Kitchen CINNAMON PO Take 1,000 mg by mouth 3 (three) times daily with meals.   Marland Kitchen diltiazem (CARTIA XT) 240 MG 24 hr capsule Take 1 capsule (240 mg total) by mouth daily.  . fish oil-omega-3 fatty acids 1000 MG capsule Take 1 g by mouth 4 (four) times a week. Sun, Mon, Wed, Fri at night  . glipiZIDE (GLUCOTROL XL) 5 MG 24 hr tablet Take 1 tablet by mouth once daily with breakfast  . hydrochlorothiazide (HYDRODIURIL) 25 MG tablet Take 1 tablet (25 mg total) by mouth daily.  . INVOKANA 100 MG TABS tablet TAKE 1 TABLET BY MOUTH BEFORE BREAKFAST  . Magnesium  250 MG TABS Take 250 mg by mouth daily with supper.  . metFORMIN (GLUCOPHAGE) 1000 MG tablet TAKE 1 TABLET BY MOUTH TWICE DAILY WITH A MEAL (Patient taking differently: Take 1,000 mg by mouth 2 (two) times daily. )  . metoprolol (LOPRESSOR) 100 MG tablet Take half tab in the morning and a whole tab in the evening. (Patient taking differently: Take 50-100 mg by mouth See admin instructions. Take 50 mg in the morning and 100 mg in the evening)  . nitroGLYCERIN (NITRO-DUR) 0.2  mg/hr patch Place 1 patch (0.2 mg total) onto the skin daily. Apply behind right toe daily  . omeprazole (PRILOSEC) 20 MG capsule Take 20 mg by mouth 2 (two) times a week. Monday and Friday evenings  . oxyCODONE (OXY IR/ROXICODONE) 5 MG immediate release tablet Take 1 tablet (5 mg total) by mouth every 6 (six) hours as needed for moderate pain.  Marland Kitchen OZEMPIC, 0.25 OR 0.5 MG/DOSE, 2 MG/1.5ML SOPN INJECT 0.5MG  INTO THE SKIN ONCE A WEEK  . sulfamethoxazole-trimethoprim (BACTRIM DS) 800-160 MG tablet Take 1 tablet by mouth 2 (two) times daily for 7 days.  Marland Kitchen telmisartan (MICARDIS) 80 MG tablet Take 1 tablet (80 mg total) by mouth daily. (Patient taking differently: Take 40 mg by mouth every evening. )  . vitamin B-12 (CYANOCOBALAMIN) 1000 MCG tablet Take 1,000 mcg by mouth daily with supper.     Allergies  Allergen Reactions  . Demerol [Meperidine]     Delusional   . Scopolamine     Delusional     Social History   Socioeconomic History  . Marital status: Widowed    Spouse name: Not on file  . Number of children: Not on file  . Years of education: Not on file  . Highest education level: Not on file  Occupational History  . Not on file  Social Needs  . Financial resource strain: Not on file  . Food insecurity    Worry: Not on file    Inability: Not on file  . Transportation needs    Medical: Not on file    Non-medical: Not on file  Tobacco Use  . Smoking status: Former Smoker    Quit date: 03/31/1972    Years since quitting: 47.3  . Smokeless tobacco: Never Used  Substance and Sexual Activity  . Alcohol use: No  . Drug use: No  . Sexual activity: Not on file  Lifestyle  . Physical activity    Days per week: Not on file    Minutes per session: Not on file  . Stress: Not on file  Relationships  . Social Herbalist on phone: Not on file    Gets together: Not on file    Attends religious service: Not on file    Active member of club or organization: Not on file     Attends meetings of clubs or organizations: Not on file    Relationship status: Not on file  . Intimate partner violence    Fear of current or ex partner: Not on file    Emotionally abused: Not on file    Physically abused: Not on file    Forced sexual activity: Not on file  Other Topics Concern  . Not on file  Social History Narrative  . Not on file     Review of Systems: General: negative for chills, fever, night sweats or weight changes.  Cardiovascular: negative for chest pain, dyspnea on exertion, edema, orthopnea, palpitations, paroxysmal nocturnal  dyspnea or shortness of breath Dermatological: negative for rash Respiratory: negative for cough or wheezing Urologic: negative for hematuria Abdominal: negative for nausea, vomiting, diarrhea, bright red blood per rectum, melena, or hematemesis Neurologic: negative for visual changes, syncope, or dizziness All other systems reviewed and are otherwise negative except as noted above.    Blood pressure (!) 106/57, pulse 94, temperature (!) 96.8 F (36 C), height 5' 4.5" (1.638 m), weight 138 lb 6.4 oz (62.8 kg), SpO2 97 %.  General appearance: alert and no distress Neck: no adenopathy, no carotid bruit, no JVD, supple, symmetrical, trachea midline and thyroid not enlarged, symmetric, no tenderness/mass/nodules Lungs: clear to auscultation bilaterally Heart: regular rate and rhythm, S1, S2 normal, no murmur, click, rub or gallop Extremities: extremities normal, atraumatic, no cyanosis or edema Pulses: Absent pedal pulses Skin: Gangrene right first and second toes with some superimposed edema Neurologic: Alert and oriented X 3, normal strength and tone. Normal symmetric reflexes. Normal coordination and gait  EKG not performed today  ASSESSMENT AND PLAN:   PAD (peripheral artery disease) ([place]) Ms. Carlton has critical limb ischemia with a failed attempt at left SFA intervention by myself 06/15/2019.  She does have gangrenous right  first and second toes.  She underwent right common femoral endarterectomy with patch angioplasty by Dr. Trula Slade on 07/21/2019 with removal of the entire plaque.  She saw Dr. Jacqualyn Posey back in the office last Thursday who thought that her toes were stable.  I am planning on performing orbital atherectomy and drug-coated balloon angioplasty plus or minus stenting of right tibioperoneal trunk on Monday, October 9 for limb salvage prior to right TMA.      [name] MD FACP,FACC,FAHA, [facility] 07/28/2019 2:16 PM

## 2019-07-29 ENCOUNTER — Telehealth: Payer: Self-pay | Admitting: Cardiology

## 2019-07-29 ENCOUNTER — Other Ambulatory Visit: Payer: Self-pay | Admitting: Cardiology

## 2019-07-29 ENCOUNTER — Telehealth: Payer: Self-pay | Admitting: *Deleted

## 2019-07-29 ENCOUNTER — Telehealth: Payer: Self-pay | Admitting: Cardiovascular Disease

## 2019-07-29 DIAGNOSIS — E875 Hyperkalemia: Secondary | ICD-10-CM

## 2019-07-29 LAB — BASIC METABOLIC PANEL
BUN/Creatinine Ratio: 19 (ref 12–28)
BUN: 19 mg/dL (ref 8–27)
CO2: 23 mmol/L (ref 20–29)
Calcium: 10.4 mg/dL — ABNORMAL HIGH (ref 8.7–10.3)
Chloride: 99 mmol/L (ref 96–106)
Creatinine, Ser: 1.01 mg/dL — ABNORMAL HIGH (ref 0.57–1.00)
GFR calc Af Amer: 62 mL/min/{1.73_m2} (ref >59)
GFR calc non Af Amer: 54 mL/min/{1.73_m2} — ABNORMAL LOW (ref >59)
Glucose: 141 mg/dL — ABNORMAL HIGH (ref 65–99)
Potassium: 6.1 mmol/L (ref 3.5–5.2)
Sodium: 138 mmol/L (ref 134–144)

## 2019-07-29 LAB — CBC
Hematocrit: 33.3 % — ABNORMAL LOW (ref 34.0–46.6)
Hemoglobin: 10.7 g/dL — ABNORMAL LOW (ref 11.1–15.9)
MCH: 27.9 pg (ref 26.6–33.0)
MCHC: 32.1 g/dL (ref 31.5–35.7)
MCV: 87 fL (ref 79–97)
Platelets: 728 10*3/uL — ABNORMAL HIGH (ref 150–450)
RBC: 3.83 x10E6/uL (ref 3.77–5.28)
RDW: 12.3 % (ref 11.7–15.4)
WBC: 16.3 10*3/uL — ABNORMAL HIGH (ref 3.4–10.8)

## 2019-07-29 NOTE — Telephone Encounter (Signed)
[name] Estimable, RN at 07/29/2019 2:00 PM  Status: Addendum    Spoke with pt, she will come by the office tomorrow morning prior to going for covid testing for repeat bmp. Order placed   ----- Message from [name], MD sent at 07/29/2019 10:26 AM EST ----- Preprocedure labs revealed a potassium of 6.1.  This needs to be repeated tomorrow to ensure this was not related to hemolysis.  White blood cell count mildly elevated probably related to her gangrene.  Otherwise if serum potassium is normal on repeat labs okay for peripheral angiogram

## 2019-07-29 NOTE — Telephone Encounter (Signed)
I received an alert from [facility] for abnormal result:   Potassium 6.1  Calcium 10.0 Creatinine 1.01  Ms. Beevers was recently started on Bactrim which is the most likely culprit for causing hyperkalemia on a background of chronic ARB therapy. Since she most likely took another dose of Bactrim after labs were drawn in the afternoon, and since K is already >6, I favor her coming in to the ED to check level and intervene if still high. I placed phone calls to Ms. Gillies and her listed contact (daughter, [name]) to make this recommendation, but neither picked up. I left voicemails at both numbers. I will try again shortly.   Teresa Pelton, MD

## 2019-07-29 NOTE — Telephone Encounter (Signed)
I attempted to call [name] again this morning regarding hyperkalemia. She did not pick up. I left a message for her to call regarding lab results.   Teresa Pelton, MD

## 2019-07-29 NOTE — Telephone Encounter (Addendum)
Spoke with pt, she will come by the office tomorrow morning prior to going for covid testing for repeat bmp. Order placed   ----- Message from [name], MD sent at 07/29/2019 10:26 AM EST ----- Preprocedure labs revealed a potassium of 6.1.  This needs to be repeated tomorrow to ensure this was not related to hemolysis.  White blood cell count mildly elevated probably related to her gangrene.  Otherwise if serum potassium is normal on repeat labs okay for peripheral angiogram

## 2019-07-29 NOTE — Telephone Encounter (Signed)
New Message   Dr. Rosario Jacks nurse Glory Buff ([place]) is calling to make Dr. Gwenlyn Found aware that the patient's Potassium level is 6.1 and is elevated. Please give Glory Buff a call back asap.

## 2019-07-30 ENCOUNTER — Other Ambulatory Visit (HOSPITAL_COMMUNITY)
Admission: RE | Admit: 2019-07-30 | Discharge: 2019-07-30 | Disposition: A | Discharge status: Home / Self Care | Payer: Medicare Other | Source: Ambulatory Visit | Attending: Cardiovascular Disease | Admitting: Cardiovascular Disease

## 2019-07-30 ENCOUNTER — Telehealth: Payer: Self-pay | Admitting: *Deleted

## 2019-07-30 DIAGNOSIS — Z20828 Contact with and (suspected) exposure to other viral communicable diseases: Secondary | ICD-10-CM | POA: Insufficient documentation

## 2019-07-30 DIAGNOSIS — E875 Hyperkalemia: Secondary | ICD-10-CM

## 2019-07-30 DIAGNOSIS — Z01812 Encounter for preprocedural laboratory examination: Secondary | ICD-10-CM | POA: Insufficient documentation

## 2019-07-30 MED ORDER — CLOPIDOGREL BISULFATE 75 MG PO TABS: 75.0000 mg | ORAL_TABLET | Freq: Every day | ORAL | 1 refills | Status: DC

## 2019-07-30 NOTE — Telephone Encounter (Addendum)
Pt contacted pre-LE angiogram scheduled at [facility] ([place]) for: Monday August 03, 2019 11:30 AM Verified arrival time and place: [place] [facility]) at: 9:30 AM   No solid food after midnight prior to cath, clear liquids until 5 AM day of procedure. Contrast allergy: no  Hold: Metformin-day of procedure and 48 hours post procedure. Glipizide-AM of procedure. Invokana-AM of procedure. HCTZ-day before and day of procedure-GFR 54  Except hold medications AM meds can be  taken pre-cath with sip of water including: ASA 81 mg Plavix 75 mg  Confirmed patient has responsible adult to drive home post procedure and observe 24 hours after arriving home: yes  Currently, due to Covid-19 pandemic, only one support person will be allowed with patient. Must be the same support person for that patient's entire stay, will be screened and required to wear a mask. They will be asked to wait in the waiting room for the duration of the patient's stay.  Patients are required to wear a mask when they enter the hospital.      COVID-19 Pre-Screening Questions:  . In the past 7 to 10 days have you had a cough,  shortness of breath, headache, congestion, fever (100 or greater) body aches, chills, sore throat, or sudden loss of taste or sense of smell? no . Have you been around anyone with known Covid 19? no . Have you been around anyone who is awaiting Covid 19 test results in the past 7 to 10 days? no . Have you been around anyone who has been exposed to Covid 19, or has mentioned symptoms of Covid 19 within the past 7 to 10 days? no  I reviewed procedure/mask/visitor instructions with patient, she verbalized understanding, thanked me for call.  Per Dr Jerrol Banana to start Plavix 75 mg daily today prior to procedure. Pt is aware she should start Plavix 75 mg daily today. Pt does take omeprazole on Monday and Friday. Pt states she decreased from daily to Monday and Friday on her own  and is not sure she needs it anymore. She will hold omeprazole for now and discuss alternative with Dr Gwenlyn Found if she continues Plavix long term.

## 2019-07-30 NOTE — Progress Notes (Signed)
[name], [name] ([id number]) Visit Report for 07/27/2019 HPI Details Patient Name: Date of Service: [name], [name] 07/27/2019 10:00 AM Medical Record Number:[id number] Patient Account Number: [id number] Date of Birth/Sex: Treating RN: Jun 25, 1941 (78 y.o. [name] Primary Care Provider: [name] Other Clinician: Referring Provider: Treating Provider/[name]:[name], [name], [name] Weeks in Treatment: 2 History of Present Illness HPI Description: ADMISSION 07/13/2019 Patient is a 78 year old type II diabetic. She has known PAD. She has been followed by Dr. Jacqualyn Posey of podiatry for blistering areas on her toes dating back to 04/30/2019 which at this was the left fourth toe. By 8/11 she had wounds on the right and left second toes. She underwent arterial studies by Dr. Alvester Chou on 7/31 that showed ABIs on the right of 0.60 TBI of 0.26 on the left ABI of 0.56 and a TBI of 0.25. By 9/10 she had ischemic-looking wounds per Dr. Earleen Newport on the right first, left first second and third. She has been using Medihoney and then mupirocin more recently simply Betadine. The patient underwent angiography by Dr. Gwenlyn Found on 9/21. 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 Noted that there was 1 vessel runoff in the foot via the peroneal. On the left there was an 80% left SFA, 70% mid left SFA. There was a short segment calcified CTO distal less than SFA and above-knee popliteal artery reconstituting with two-vessel runoff. 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 It was felt that she had bilateral SFA disease as well as tibial vessel disease. An attempt was made to revascularize the left SFA but they were unable to cross because of the highly calcified nature of the lesion. 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 She has an area on the medial left fourth toe with raised horned callus on top of this. I am not certain what this represents. With regards to pain she has about a 2-1/2 I will claudication tolerance in the grocery store. She has some pain in night which is relieved by putting her feet down over the side of the bed. She is wearing Nitro-Dur patches on the top of her right foot. Past medical history includes type 2 diabetes with secondary PAD, neoplasm of the skin, diabetic retinopathy, carotid artery stenosis, hyperlipidemia hypertension. 11/2; the last time the patient was here I spoke to Dr. Gwenlyn Found about revascularization options on the right. As I understand things currently Dr. Gwenlyn Found spoke with Dr. Trula Slade and ultimately the patient was taken to surgery on 10/27. She had a right iliofemoral endarterectomy with a bovine patch angioplasty. I think the plan now is for her to have a staged intervention on the right SFA by Dr. Alvester Chou. Per the patient's understanding Dr. Gwenlyn Found and Dr. Jacqualyn Posey are waiting to see when the gangrenous toes on the right foot can be amputated. The patient states her pain is a lot better and she is grateful for that. 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 Small area on the left fourth toe. She is using Betadine to the mummified areas on the right and Medihoney on the left Electronic Signature(s) Signed: 07/27/2019 5:57:33 PM By: [name] MD Entered By: [name] on 07/27/2019 11:21:22 -------------------------------------------------------------------------------- Physical Exam Details Patient Name: Date  of Service: [name], [name] 07/27/2019 10:00 AM Medical Record Number:[id number] Patient Account Number: [id number] Date of Birth/Sex: Treating RN: Mar 17, 1941 (78 y.o. [name] Primary Care Provider: [name] Other  Clinician: Referring Provider: Treating Provider/[name]:[name], [name], [name] Weeks in Treatment: 2 Constitutional Sitting or standing Blood Pressure is within target range for patient.. Pulse regular and within target range for patient.Marland Kitchen Respirations regular, non-labored and within target range.. Temperature is normal and within the target range for the patient.Marland Kitchen Appears in no distress. Eyes Conjunctivae clear. No discharge.no icterus. Cardiovascular Pedal pulses absent bilaterally.. Integumentary (Hair, Skin) No erythema around any of the wounds. Psychiatric appears at normal baseline. Notes Wound exam On the right she still has mummified dry gangrene on the right first second and third toes. On the left and ischemic area on the left dorsal fourth toe. There is no evidence of infection Electronic Signature(s) Signed: 07/27/2019 5:57:33 PM By: [name] MD Entered By: [name] on 07/27/2019 11:22:42 -------------------------------------------------------------------------------- Physician Orders Details Patient Name: Date of Service: [name] 07/27/2019 10:00 AM Medical Record Number:[id number] Patient Account Number: [id number] Date of Birth/Sex: Treating RN: [birth year] (78 y.o. [name] Primary Care Provider: [name] Other Clinician: Referring Provider: Treating Provider/[name]:[name], [name], [name] Weeks in Treatment: 2 Verbal / Phone Orders: No Diagnosis Coding Discharge From [facility] Services Discharge from [place] - continue to follow up with vascular and podiatry Dressing Change Frequency Change dressing every day. - all wounds Wound Cleansing May shower and wash wound with soap and water. Primary Wound Dressing Wound #1 Left,Dorsal Toe Fourth Medihoney gel Wound #2 Right,Circumferential Toe Great Other: - Paint with betadine Wound #3 Right,Circumferential Toe Second Other: - Paint  with betadine Wound #4 Right,Circumferential Toe Third Other: - Paint with betadine Wound #5 Right Toe Fourth Other: - Paint with betadine Wound #6 Right Toe Fifth Other: - Paint with betadine Secondary Dressing Dry Gauze - to all wounds Electronic Signature(s) Signed: 07/27/2019 5:57:33 PM By: [name] MD Signed: 07/30/2019 5:16:49 PM By: Kela Millin Entered By: Kela Millin on 07/27/2019 11:15:03 -------------------------------------------------------------------------------- Problem List Details Patient Name: Date of Service: [name] 07/27/2019 10:00 AM Medical Record Number:[id number] Patient Account Number: [id number] Date of Birth/Sex: Treating RN: [birth year] (78 y.o. [name] Primary Care Provider: [name] Other Clinician: Referring Provider: Treating Provider/[name]:[name], [name], [name] Weeks in Treatment: 2 Active Problems ICD-10 Evaluated Encounter Code Description Active Date Today Diagnosis E11.621 Type 2 diabetes mellitus with foot ulcer 07/13/2019 No Yes E11.52 Type 2 diabetes mellitus with diabetic peripheral 07/13/2019 No Yes angiopathy with gangrene L97.518 Non-pressure chronic ulcer of other part of right foot 07/13/2019 No Yes with other specified severity L97.528 Non-pressure chronic ulcer of other part of left foot 07/13/2019 No Yes with other specified severity E11.42 Type 2 diabetes mellitus with diabetic polyneuropathy 07/13/2019 No Yes Inactive Problems Resolved Problems Electronic Signature(s) Signed: 07/27/2019 5:57:33 PM By: [name] MD Entered By: [name] on 07/27/2019 11:20:58 -------------------------------------------------------------------------------- Progress Note Details Patient Name: Date of Service: [name] 07/27/2019 10:00 AM Medical Record Number:[id number] Patient Account Number: [id number] Date of Birth/Sex: Treating RN: 1941-05-05 (78 y.o. [name] Primary Care Provider: [name] Other Clinician: Referring Provider: Treating Provider/[name]:[name], [name], [name] Weeks in Treatment: 2 Subjective History of Present Illness (HPI) ADMISSION 07/13/2019 Patient is a 78 year old type II diabetic. She has known PAD. She has been followed by Dr. Jacqualyn Posey of podiatry for blistering areas on her toes dating back to  04/30/2019 which at this was the left fourth toe. By 8/11 she had wounds on the right and left second toes. She underwent arterial studies by Dr. Alvester Chou on 7/31 that showed ABIs on the right of 0.60 TBI of 0.26 on the left ABI of 0.56 and a TBI of 0.25. By 9/10 she had ischemic-looking wounds per Dr. Earleen Newport on the right first, left first second and third. She has been using Medihoney and then mupirocin more recently simply Betadine. The patient underwent angiography by Dr. Gwenlyn Found on 9/21. 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 Noted that there was 1 vessel runoff in the foot via the peroneal. ooOn the left there was an 80% left SFA, 70% mid left SFA. There was a short segment calcified CTO distal less than SFA and above-knee popliteal artery reconstituting with two-vessel runoff. 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 It was felt that she had bilateral SFA disease as well as tibial vessel disease. An attempt was made to revascularize the left SFA but they were unable to cross because of the highly calcified nature of the lesion. 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 She has an area on the medial left fourth toe with raised horned callus on top of this. I am not certain what this represents. With regards to pain she has about a 2-1/2 I will  claudication tolerance in the grocery store. She has some pain in night which is relieved by putting her feet down over the side of the bed. She is wearing Nitro-Dur patches on the top of her right foot. Past medical history includes type 2 diabetes with secondary PAD, neoplasm of the skin, diabetic retinopathy, carotid artery stenosis, hyperlipidemia hypertension. 11/2; the last time the patient was here I spoke to Dr. Gwenlyn Found about revascularization options on the right. As I understand things currently Dr. Gwenlyn Found spoke with Dr. Trula Slade and ultimately the patient was taken to surgery on 10/27. She had a right iliofemoral endarterectomy with a bovine patch angioplasty. I think the plan now is for her to have a staged intervention on the right SFA by Dr. Alvester Chou. Per the patient's understanding Dr. Gwenlyn Found and Dr. Jacqualyn Posey are waiting to see when the gangrenous toes on the right foot can be amputated. The patient states her pain is a lot better and she is grateful for that. 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 Small area on the left fourth toe. She is using Betadine to the mummified areas on the right and Medihoney on the left Objective Constitutional Sitting or standing Blood Pressure is within target range for patient.. Pulse regular and within target range for patient.Marland Kitchen Respirations regular, non-labored and within target range.. Temperature is normal and within the target range for the patient.Marland Kitchen Appears in no distress. Vitals Time Taken: 10:38 AM, Height: 64 in, Weight: 142 lbs, BMI: 24.4, Temperature: 97.7 F, Pulse: 81 bpm, Respiratory Rate: 18 breaths/min, Blood Pressure: 135/54 mmHg, Capillary Blood Glucose: 105 mg/dl. General Notes: glucose per pt report Eyes Conjunctivae clear. No discharge.no icterus. Cardiovascular Pedal pulses absent bilaterally.Marland Kitchen Psychiatric appears at normal baseline. General Notes: Wound exam ooOn the right she still has  mummified dry gangrene on the right first second and third  toes. On the left and ischemic area on the left dorsal fourth toe. There is no evidence of infection Integumentary (Hair, Skin) No erythema around any of the wounds. Wound #1 status is Open. Original cause of wound was Blister. The wound is located on the Left,Dorsal Toe Fourth. The wound measures 1cm length x 0.8cm width x 0.1cm depth; 0.628cm^2 area and 0.063cm^3 volume. There is no tunneling or undermining noted. There is a small amount of serosanguineous drainage noted. The wound margin is flat and intact. There is no granulation within the wound bed. There is a large (67-100%) amount of necrotic tissue within the wound bed including Adherent Slough. Wound #2 status is Open. Original cause of wound was Blister. The wound is located on the Right,Circumferential Toe Great. The wound measures 6.5cm length x 6.5cm width x 0.1cm depth; 33.183cm^2 area and 3.318cm^3 volume. There is no tunneling or undermining noted. There is a small amount of serosanguineous drainage noted. The wound margin is flat and intact. There is no granulation within the wound bed. There is a large (67-100%) amount of necrotic tissue within the wound bed including Eschar. Wound #3 status is Open. Original cause of wound was Blister. The wound is located on the Right,Circumferential Toe Second. The wound measures 6cm length x 5cm width x 0.1cm depth; 23.562cm^2 area and 2.356cm^3 volume. There is no tunneling or undermining noted. There is a small amount of serosanguineous drainage noted. The wound margin is flat and intact. There is no granulation within the wound bed. There is a large (67-100%) amount of necrotic tissue within the wound bed including Eschar. Wound #4 status is Open. Original cause of wound was Blister. The wound is located on the Right,Circumferential Toe Third. The wound measures 3cm length x 3cm width x 0.1cm depth; 7.069cm^2 area and 0.707cm^3  volume. There is no tunneling or undermining noted. There is a small amount of serosanguineous drainage noted. The wound margin is flat and intact. There is no granulation within the wound bed. There is a large (67-100%) amount of necrotic tissue within the wound bed including Eschar. Wound #5 status is Open. Original cause of wound was Blister. The wound is located on the Right Toe Fourth. The wound measures 0.4cm length x 0.3cm width x 0.1cm depth; 0.094cm^2 area and 0.009cm^3 volume. There is no tunneling or undermining noted. There is a small amount of serosanguineous drainage noted. The wound margin is flat and intact. There is no granulation within the wound bed. There is a large (67-100%) amount of necrotic tissue within the wound bed including Eschar. Wound #6 status is Open. Original cause of wound was Blister. The wound is located on the Right Toe Fifth. The wound measures 0.4cm length x 0.5cm width x 0.1cm depth; 0.157cm^2 area and 0.016cm^3 volume. There is no tunneling or undermining noted. There is a none present amount of drainage noted. The wound margin is flat and intact. There is no granulation within the wound bed. There is a large (67-100%) amount of necrotic tissue within the wound bed including Eschar. 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 - all wounds Wound Cleansing: May shower and wash wound with soap and water. Primary Wound Dressing: Wound #1 Left,Dorsal Toe  Fourth: Medihoney gel Wound #2 Right,Circumferential Toe Great: Other: - Paint with betadine Wound #3 Right,Circumferential Toe Second: Other: - Paint with betadine Wound #4 Right,Circumferential Toe Third: Other: - Paint with betadine Wound #5 Right Toe Fourth: Other: - Paint with betadine Wound #6 Right Toe Fifth: Other: - Paint with betadine Secondary Dressing: Dry Gauze - to all wounds 1. Continue with the Betadine to the right first second and third toes and Medihoney to the left fourth toe. 2. The patient has had a right iliofemoral endarterectomy. My understanding is she is going back to see Dr. Gwenlyn Found for staged interventions on the right SFA. Dr. Gwenlyn Found and Dr. Jacqualyn Posey will communicate about when the right first section and third toes need to be amputated. 3. I do not think the patient needs to be followed here currently. It looks as though up she has all the bases in terms of providers covered. There is no evidence of infection. 4. I am discharging her from this clinic. I talked to the patient about this. Should there be further wound development we be glad to look at her at the request of the patient or her providers Electronic Signature(s) Signed: 07/27/2019 5:57:33 PM By: [name] MD Entered By: [name] on 07/27/2019 11:24:47 -------------------------------------------------------------------------------- SuperBill Details Patient Name: Date of Service: [name] 07/27/2019 Medical Record Number:[id number] Patient Account Number: [id number] Date of Birth/Sex: Treating RN: 12/25/1940 (78 y.o. 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

## 2019-07-30 NOTE — Telephone Encounter (Signed)
Patient in office for repeat BMET Order placed

## 2019-07-30 NOTE — Progress Notes (Addendum)
[name], [name] ([id number]) Visit Report for 07/27/2019 Arrival Information Details Patient Name: Date of Service: [name], [name] 07/27/2019 10:00 AM Medical Record Number:[id number] Patient Account Number: [id number] Date of Birth/Sex: Treating RN: 1940-10-22 (78 y.o. [name] Primary Care Koula Venier: [name] Other Clinician: Referring Lekeshia Kram: Treating Rosalene Wardrop/[name]:[name], [name], [name] Weeks in Treatment: 2 Visit Information History Since Last Visit All ordered tests and consults were completed: Yes Patient Arrived: Ambulatory Added or deleted any medications: Yes Arrival Time: 10:36 Any new allergies or adverse reactions: No Accompanied By: alone Had a fall or experienced change in No Transfer Assistance: None activities of daily living that may affect Patient Identification Verified: Yes risk of falls: Secondary Verification Process Yes Signs or symptoms of abuse/neglect since last No Completed: visito Patient Requires Transmission- No Hospitalized since last visit: No Based Precautions: Implantable device outside of the clinic excluding No Patient Has Alerts: Yes cellular tissue based products placed in the center Patient Alerts: Right ABI .62 TBI since last visit: refused Has Dressing in Place as Prescribed: Yes Left ABI .60 TBI Pain Present Now: Yes .14 Electronic Signature(s) Signed: 07/27/2019 6:00:40 PM By: [name] RN, BSN Entered By: [name] on 07/27/2019 10:37:35 -------------------------------------------------------------------------------- Clinic Level of Care Assessment Details Patient Name: Date of Service: [name], [name] 07/27/2019 10:00 AM Medical Record Number:[id number] Patient Account Number: [id number] Date of Birth/Sex: Treating RN: January 16, 1941 (78 y.o. [name] Primary Care Mariann Palo: [name] Other Clinician: Referring Narcissus Detwiler: Treating Tola Meas/[name]:[name],  [name], [name] Weeks in Treatment: 2 Clinic Level of Care Assessment Items TOOL 4 Quantity Score X - Use when only an EandM is performed on FOLLOW-UP visit 1 0 ASSESSMENTS - Nursing Assessment / Reassessment X - Reassessment of Co-morbidities (includes updates in patient status) 1 10 X - Reassessment of Adherence to Treatment Plan 1 5 ASSESSMENTS - Wound and Skin Assessment / Reassessment []  - Simple Wound Assessment / Reassessment - one wound 0 X - Complex Wound Assessment / Reassessment - multiple wounds 6 5 []  - Dermatologic / Skin Assessment (not related to wound area) 0 ASSESSMENTS - Focused Assessment X - Circumferential Edema Measurements - multi extremities 1 5 []  - Nutritional Assessment / Counseling / Intervention 0 []  - Lower Extremity Assessment (monofilament, tuning fork, pulses) 0 []  - Peripheral Arterial Disease Assessment (using hand held doppler) 0 ASSESSMENTS - Ostomy and/or Continence Assessment and Care []  - Incontinence Assessment and Management 0 []  - Ostomy Care Assessment and Management (repouching, etc.) 0 PROCESS - Coordination of Care X - Simple Patient / Family Education for ongoing care 1 15 []  - Complex (extensive) Patient / Family Education for ongoing care 0 X - Staff obtains Programmer, systems, Records, Test Results / Process Orders 1 10 []  - Staff telephones HHA, Nursing Homes / Clarify orders / etc 0 []  - Routine Transfer to another Facility (non-emergent condition) 0 []  - Routine Hospital Admission (non-emergent condition) 0 []  - New Admissions / Biomedical engineer / Ordering NPWT, Apligraf, etc. 0 []  - Emergency Hospital Admission (emergent condition) 0 X - Simple Discharge Coordination 1 10 []  - Complex (extensive) Discharge Coordination 0 PROCESS - Special Needs []  - Pediatric / Minor Patient Management 0 []  - Isolation Patient Management 0 []  - Hearing / Language / Visual special needs 0 []  - Assessment of Community assistance  (transportation, D/C planning, etc.) 0 []  - Additional assistance / Altered mentation 0 []  - Support Surface(s) Assessment (bed, cushion, seat, etc.) 0 INTERVENTIONS - Wound Cleansing / Measurement []  -  Simple Wound Cleansing - one wound 0 X - Complex Wound Cleansing - multiple wounds 6 5 []  - Wound Imaging (photographs - any number of wounds) 0 []  - Wound Tracing (instead of photographs) 0 []  - Simple Wound Measurement - one wound 0 X - Complex Wound Measurement - multiple wounds 6 5 INTERVENTIONS - Wound Dressings X - Small Wound Dressing one or multiple wounds 6 10 []  - Medium Wound Dressing one or multiple wounds 0 []  - Large Wound Dressing one or multiple wounds 0 X - Application of Medications - topical 1 5 []  - Application of Medications - injection 0 INTERVENTIONS - Miscellaneous []  - External ear exam 0 []  - Specimen Collection (cultures, biopsies, blood, body fluids, etc.) 0 []  - Specimen(s) / Culture(s) sent or taken to Lab for analysis 0 []  - Patient Transfer (multiple staff / Civil Service fast streamer / Similar devices) 0 []  - Simple Staple / Suture removal (25 or less) 0 []  - Complex Staple / Suture removal (26 or more) 0 []  - Hypo / Hyperglycemic Management (close monitor of Blood Glucose) 0 []  - Ankle / Brachial Index (ABI) - do not check if billed separately 0 X - Vital Signs 1 5 Has the patient been seen at the hospital within the last three years: Yes Total Score: 215 Level Of Care: New/Established - Level 5 Electronic Signature(s) Signed: 07/30/2019 5:16:49 PM By: Kela Millin Entered By: Kela Millin on 07/27/2019 11:18:22 -------------------------------------------------------------------------------- Encounter Discharge Information Details Patient Name: Date of Service: [name] 07/27/2019 10:00 AM Medical Record Number:[id number] Patient Account Number: [id number] Date of Birth/Sex: Treating RN: 1940-09-28 (78 y.o. [name] Primary Care Irelynn Schermerhorn:  [name] Other Clinician: Referring Dorothia Passmore: Treating Adora Yeh/[name]:[name], [name], [name] Weeks in Treatment: 2 Encounter Discharge Information Items Discharge Condition: Stable Ambulatory Status: Ambulatory Discharge Destination: Home Transportation: Private Auto Accompanied By: self Schedule Follow-up Appointment: No Clinical Summary of Care: Electronic Signature(s) Signed: 07/27/2019 5:49:34 PM By: Deon Pilling Entered By: Deon Pilling on 07/27/2019 16:55:40 -------------------------------------------------------------------------------- Lower Extremity Assessment Details Patient Name: Date of Service: [name], [name] 07/27/2019 10:00 AM Medical Record Number:[id number] Patient Account Number: [id number] Date of Birth/Sex: Treating RN: [birth year] (78 y.o. [name] Primary Care Miyah Hampshire: [name] Other Clinician: Referring Lonnette Shrode: Treating Treyshaun Keatts/[name]:[name], [name], [name] Weeks in Treatment: 2 Edema Assessment Assessed: [Left: No] [Right: No] E[Left: dema] [Right: :] Calf Left: Right: Point of Measurement: 39 cm From Medial Instep 28 cm 28 cm Ankle Left: Right: Point of Measurement: 10 cm From Medial Instep 21 cm 20 cm Vascular Assessment Pulses: Dorsalis Pedis Palpable: [Left:Yes] Electronic Signature(s) Signed: 07/27/2019 6:00:40 PM By: [name] RN, BSN Entered By: [name] on 07/27/2019 10:40:28 -------------------------------------------------------------------------------- [place] Details Patient Name: Date of Service: [name] 07/27/2019 10:00 AM Medical Record Number:[id number] Patient Account Number: [id number] Date of Birth/Sex: Treating RN: 1941-05-04 (78 y.o. [name] Primary Care Jemeka Wagler: [name] Other Clinician: Referring Cullan Launer: Treating Gurneet Matarese/[name]:[name], [name], [name] Weeks in Treatment:  2 Active Inactive Electronic Signature(s) Signed: 07/30/2019 5:16:49 PM By: Kela Millin Entered By: Kela Millin on 07/27/2019 11:16:47 -------------------------------------------------------------------------------- Pain Assessment Details Patient Name: Date of Service: ALAYLAH, HEATHER[place] 07/27/2019 10:00 AM Medical Record Number:[id number] Patient Account Number: [id number] Date of Birth/Sex: Treating RN: 05/20/1941 (78 y.o. [name] Primary Care Thi Sisemore: [name] Other Clinician: Referring Brielynn Sekula: Treating Faven Watterson/[name]:[name], [name], [name] Weeks in Treatment: 2 Active Problems Location of Pain Severity and Description of Pain Patient Has Paino Yes Site Locations  Pain Location: Pain in Ulcers With Dressing Change: Yes Duration of the Pain. Constant / Intermittento Intermittent Rate the pain. Current Pain Level: 2 Character of Pain Describe the Pain: Dull Pain Management and Medication Current Pain Management: Medication: Yes Cold Application: No Rest: No Massage: No Activity: No T.E.N.S.: No Heat Application: No Leg drop or elevation: No Is the Current Pain Management Adequate: Adequate How does your wound impact your activities of daily livingo Sleep: No Bathing: No Appetite: No Relationship With Others: No Bladder Continence: No Emotions: No Bowel Continence: No Work: No Toileting: No Drive: No Dressing: No Hobbies: No Electronic Signature(s) Signed: 07/27/2019 6:00:40 PM By: [name] RN, BSN Entered By: [name] on 07/27/2019 10:40:22 -------------------------------------------------------------------------------- Patient/Caregiver Education Details Patient Name: [name] 11/2/2020andnbsp10:00 Date of Service: AM Medical Record [id number] Number: Patient Account Number: [id number] Date of Birth/Gender: 09-18-1941 (78 y.o. F) Treating RN: Kela Millin Other Clinician: Primary Care  Physician: Verna Czech Physician/ExtenderLindell Noe, Referring Physician: [name] in Treatment: 2 Education Assessment Education Provided To: Patient Education Topics Provided Elevated Blood Sugar/ Impact on Healing: Methods: Explain/Verbal Responses: State content correctly Nutrition: Methods: Explain/Verbal Responses: State content correctly Tissue Oxygenation: Methods: Explain/Verbal Responses: State content correctly Wound/Skin Impairment: Methods: Explain/Verbal Responses: State content correctly Electronic Signature(s) Signed: 07/30/2019 5:16:49 PM By: Kela Millin Entered By: Kela Millin on 07/27/2019 11:19:10 -------------------------------------------------------------------------------- Wound Assessment Details Patient Name: Date of Service: [name] 07/27/2019 10:00 AM Medical Record Number:[id number] Patient Account Number: [id number] Date of Birth/Sex: Treating RN: 1940/09/25 (78 y.o. 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 Etiology: Date Acquired: 09/16/2018 Wound Open Weeks Of Treatment: 2 Status: Clustered Wound: No Comorbid Deep Vein Thrombosis, Hypertension, History: Peripheral Arterial Disease, Type II Diabetes Photos Wound Measurements Length: (cm) 1 % Reducti Width: (cm) 0.8 % Reducti Depth: (cm) 0.1 Epithelia Area: (cm) 0.628 Tunnelin Volume: (cm) 0.063 Undermin Wound Description Classification: Grade 2 Wound Margin: Flat and Intact Exudate Amount: Small Exudate Type: Serosanguineous Exudate Color: red, brown Wound Bed Granulation Amount:  None Present (0%) Necrotic Amount: Large (67-100%) Necrotic Quality: Adherent Slough Foul Odor After Cleansing: No Slough/Fibrino Yes Exposed Structure Fascia Exposed: No Fat Layer (Subcutaneous Tissue) Exposed: No Tendon Exposed: No Muscle Exposed: No Joint Exposed: No Bone Exposed: No on in Area: 11.2% on in Volume: 11.3% lization: None g: No ing: No Electronic Signature(s) Signed: 08/06/2019 3:57:32 PM By: [name] EMT/HBOT Signed: 09/02/2019 12:08:18 PM By: [name] RN, BSN Previous Signature: 07/27/2019 6:00:40 PM Version By: [name] RN, BSN Entered By: [name] on 08/06/2019 10:17:19 -------------------------------------------------------------------------------- Wound Assessment Details Patient Name: Date of Service: [name] 07/27/2019 10:00 AM Medical Record Number:[id number] Patient Account Number: [id number] Date of Birth/Sex: Treating RN: 04-06-1941 (78 y.o. [name] Primary Care Mardee Clune: [name] Other Clinician: Referring Ladamien Rammel: Treating Mersedes Alber/[name]:[name], [name], [name] Weeks in Treatment: 2 Wound Status Wound Number: 2 Primary Diabetic Wound/Ulcer of the Lower Etiology: Extremity Wound Location: Right Toe Great - Circumferential Secondary Arterial Insufficiency Ulcer Wounding Event: Blister Etiology: Date Acquired: 06/15/2019 Wound Open Weeks Of Treatment: 2 Status: Clustered Wound: No Comorbid Deep Vein Thrombosis, Hypertension, History: Peripheral Arterial Disease, Type II Diabetes Photos Wound Measurements Length: (cm) 6.5 % Reduction Width: (cm) 6.5 % Reduction Depth: (cm)  0.1 Epitheliali Area: (cm) 33.183 Tunneling: Volume: (cm) 3.318 Underminin Wound Description Classification: Grade 4 Wound Margin: Flat and Intact Exudate Amount: Small Exudate Type: Serosanguineous Exudate Color: red, brown Wound Bed Granulation Amount: None Present (0%) Necrotic Amount:  Large (67-100%) Necrotic Quality: Eschar Foul Odor After Cleansing: No Slough/Fibrino Yes Exposed Structure Fascia Exposed: No Fat Layer (Subcutaneous Tissue) Exposed: No Tendon Exposed: No Muscle Exposed: No Joint Exposed: No Bone Exposed: No in Area: -111.2% in Volume: -111.2% zation: None No g: No Electronic Signature(s) Signed: 08/06/2019 3:57:32 PM By: [name] EMT/HBOT Signed: 09/02/2019 12:08:18 PM By: [name] RN, BSN Previous Signature: 07/27/2019 6:00:40 PM Version By: [name] RN, BSN Entered By: [name] on 08/06/2019 10:19:21 -------------------------------------------------------------------------------- Wound Assessment Details Patient Name: Date of Service: [name] 07/27/2019 10:00 AM Medical Record Number:[id number] Patient Account Number: [id number] Date of Birth/Sex: Treating RN: 1940-09-26 (78 y.o. 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 Wounding Event: Blister Etiology: Date Acquired: 06/15/2019 Wound Amputation - Anticipated Weeks Of Treatment: 2 Status: Clustered Wound: No Outcome Digit/Ray Level: Comorbid Deep Vein Thrombosis, Hypertension, History: Peripheral Arterial Disease, Type II Diabetes Photos Wound Measurements Length: (cm) 6 Width: (cm) 5 Depth: (cm) 0.1 Area: (cm) 23.562 Volume: (cm) 2.356 Wound Description Classification: Grade 4 Wound Margin: Flat and Intact Exudate Amount: Small Exudate Type: Serosanguineous Exudate Color: red, brown Wound Bed Granulation Amount: None Present (0%) Necrotic Amount: Large (67-100%) Necrotic Quality: Eschar After  Cleansing: No rino Yes Exposed Structure sed: No Subcutaneous Tissue) Exposed: No sed: No sed: No ed: No d: No % Reduction in Area: -100% % Reduction in Volume: -100% Epithelialization: None Tunneling: No Undermining: No Foul Odor Slough/Fib Fascia Expo Fat Layer ( Tendon Expo Muscle Expo Joint Expos Bone Expose Electronic Signature(s) Signed: 08/06/2019 3:57:32 PM By: [name] EMT/HBOT Signed: 09/02/2019 12:08:18 PM By: [name] RN, BSN Previous Signature: 07/27/2019 6:00:40 PM Version By: [name] RN, BSN Entered By: [name] on 08/06/2019 10:20:00 -------------------------------------------------------------------------------- Wound Assessment Details Patient Name: Date of Service: [name] 07/27/2019 10:00 AM Medical Record Number:[id number] Patient Account Number: [id number] Date of Birth/Sex: Treating RN: [birth year] (78 y.o. 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 Location: Right Toe Third - Circumferential Secondary Arterial Insufficiency Ulcer Wounding Event: Blister Etiology: Date Acquired: 06/15/2019 Wound Amputation - Anticipated Weeks Of Treatment: 2 Status: Clustered Wound: No Outcome Digit/Ray Level: Comorbid Deep Vein Thrombosis, Hypertension, History: Peripheral Arterial Disease, Type II Diabetes Photos Wound Measurements Length: (cm) 3 % Reducti Width: (cm) 3 % Reducti Depth: (cm) 0.1 Epithelia Area: (cm) 7.069 Tunnelin Volume: (cm) 0.707 Undermin Wound Description Classification: Grade 4 Wound Margin: Flat and Intact Exudate Amount: Small Exudate Type: Serosanguineous Exudate Color: red, brown Wound Bed Granulation Amount: None Present (0%) Necrotic Amount: Large  (67-100%) Necrotic Quality: Eschar Foul Odor After Cleansing: No Slough/Fibrino Yes Exposed Structure Fascia Exposed: No Fat Layer (Subcutaneous Tissue) Exposed: No Tendon Exposed: No Muscle Exposed: No Joint Exposed: No Bone Exposed: No on in Area: -80% on in Volume: -79.9% lization: None g: No ing: No Electronic Signature(s) Signed: 08/06/2019 3:57:32 PM By: [name]  EMT/HBOT Signed: 09/02/2019 12:08:18 PM By: [name] RN, BSN Previous Signature: 07/27/2019 6:00:40 PM Version By: [name] RN, BSN Entered By: [name] on 08/06/2019 10:20:44 -------------------------------------------------------------------------------- Wound Assessment Details Patient Name: Date of Service: [name] 07/27/2019 10:00 AM Medical Record Number:[id number] Patient Account Number: [id number] Date of Birth/Sex: Treating RN: 1940/11/14 (78 y.o. 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 Etiology: Date Acquired: 06/15/2019 Wound Amputation - Anticipated Weeks Of Treatment: 2 Status: Clustered Wound: No Outcome Digit/Ray Level: Comorbid Deep Vein Thrombosis, Hypertension, History: Peripheral Arterial Disease, Type II Diabetes Photos Wound Measurements Length: (cm) 0.4 % Reduction in Are Width: (cm) 0.3 % Reduction in Vol Depth: (cm) 0.1 Epithelialization: Area: (cm) 0.094 Tunneling: Volume: (cm) 0.009 Undermining: Wound Description Classification: Grade 2 Foul Odor After C Wound Margin: Flat and Intact Slough/Fibrino Exudate Amount: Small Exudate Type: Serosanguineous Exudate Color: red, brown Wound Bed Granulation Amount: None  Present (0%) Necrotic Amount: Large (67-100%) Fascia Exposed: Necrotic Quality: Eschar Fat Layer (Subcuta Tendon Exposed: Muscle Exposed: Joint Exposed: Bone Exposed: Electronic Signature(s) Signed: 08/06/2019 3:57:32 PM By: [name] EMT/HBOT Signed: 09/02/2019 12:08:18 PM By: [name] RN, BSN Previous Signature: 07/27/2019 6:00:40 PM Version By: Cindie Laroche Entered By: [name] on 11/12/ leansing: No Yes Exposed Structure No neous Tissue) Exposed: No No No No No ra RN, BSN 2020 10:24:54 a: -32.4% ume: -28.6% None No No -------------------------------------------------------------------------------- Wound Assessment Details Patient Name: Date of Service: [name], [name] 07/27/2019 10:00 AM Medical Record Number:[id number] Patient Account Number: [id number] Date of Birth/Sex: Treating RN: 15-Oct-1940 (78 y.o. 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 Etiology: Date Acquired: 06/15/2019 Wound Open Weeks Of Treatment: 2 Status: Clustered Wound: No Comorbid Deep Vein Thrombosis, Hypertension, History: Peripheral Arterial Disease, Type II Diabetes Wound Measurements Length: (cm) 0.4 Width: (cm) 0.5 Depth: (cm) 0.1 Area: (cm) 0.157 Volume: (cm) 0.016 Wound Description Classification: Grade 2 Wound Margin: Flat and Intact Exudate Amount: None Present Wound Bed Granulation Amount: None Present (0%) Necrotic Amount: Large (67-100%) Necrotic Quality: Eschar fter Cleansing: No ino No Exposed Structure sed: No Subcutaneous Tissue) Exposed: No sed: No sed: No ed: No d: No % Reduction in Area: -67% % Reduction in  Volume: -77.8% Epithelialization: None Tunneling: No Undermining: No Foul Odor A Slough/Fibr Fascia Expo Fat Layer ( Tendon Expo Muscle Expo Joint Expos Bone Expose Electronic Signature(s) Signed: 07/27/2019 6:00:40 PM By: [name] RN, BSN Entered By: [name] on 07/27/2019 10:52:10 -------------------------------------------------------------------------------- Vitals Details Patient Name: Date of Service: [name] 07/27/2019 10:00 AM Medical Record Number:[id number] Patient Account Number: [id number] Date of Birth/Sex: Treating RN: 04/16/1941 (78 y.o. [name] Primary Care Raquel Racey: [name] Other Clinician: Referring Maribeth Jiles: Treating Asia Favata/[name]:[name], [name], [name] Weeks in Treatment: 2 Vital Signs Time Taken: 10:38 Temperature (F): 97.7 Height (in): 64 Pulse (bpm): 81 Weight (lbs): 142 Respiratory Rate (breaths/min): 18 Body Mass Index (BMI): 24.4 Blood Pressure (mmHg): 135/54 Capillary Blood Glucose (mg/dl): 105 Reference Range: 80 - 120 mg / dl Notes glucose per pt report Electronic Signature(s) Signed: 07/27/2019 6:00:40  PM By: [name] RN, BSN Entered By: [name] on 07/27/2019 10:39:49

## 2019-07-31 ENCOUNTER — Other Ambulatory Visit: Payer: Self-pay | Admitting: *Deleted

## 2019-07-31 LAB — BASIC METABOLIC PANEL
BUN/Creatinine Ratio: 26 (ref 12–28)
BUN: 22 mg/dL (ref 8–27)
CO2: 24 mmol/L (ref 20–29)
Calcium: 10.3 mg/dL (ref 8.7–10.3)
Chloride: 96 mmol/L (ref 96–106)
Creatinine, Ser: 0.84 mg/dL (ref 0.57–1.00)
GFR calc Af Amer: 78 mL/min/{1.73_m2} (ref >59)
GFR calc non Af Amer: 67 mL/min/{1.73_m2} (ref >59)
Glucose: 207 mg/dL — ABNORMAL HIGH (ref 65–99)
Potassium: 5.7 mmol/L — ABNORMAL HIGH (ref 3.5–5.2)
Sodium: 137 mmol/L (ref 134–144)

## 2019-07-31 LAB — NOVEL CORONAVIRUS, NAA (HOSP ORDER, SEND-OUT TO REF LAB; TAT 18-24 HRS): SARS-CoV-2, NAA: NOT DETECTED

## 2019-08-03 ENCOUNTER — Encounter (HOSPITAL_COMMUNITY): Payer: Medicare Other

## 2019-08-03 ENCOUNTER — Ambulatory Visit (HOSPITAL_COMMUNITY)
Admission: RE | Admit: 2019-08-03 | Discharge: 2019-08-04 | Disposition: A | Discharge status: Home / Self Care | Payer: Medicare Other | Attending: Cardiovascular Disease | Admitting: Cardiovascular Disease

## 2019-08-03 ENCOUNTER — Encounter (HOSPITAL_COMMUNITY)
Admission: RE | Disposition: A | Discharge status: Home / Self Care | Payer: Self-pay | Source: Home / Self Care | Attending: Cardiovascular Disease

## 2019-08-03 ENCOUNTER — Other Ambulatory Visit: Payer: Self-pay

## 2019-08-03 DIAGNOSIS — Z7902 Long term (current) use of antithrombotics/antiplatelets: Secondary | ICD-10-CM | POA: Insufficient documentation

## 2019-08-03 DIAGNOSIS — Z885 Allergy status to narcotic agent status: Secondary | ICD-10-CM | POA: Insufficient documentation

## 2019-08-03 DIAGNOSIS — Z87891 Personal history of nicotine dependence: Secondary | ICD-10-CM | POA: Insufficient documentation

## 2019-08-03 DIAGNOSIS — I70229 Atherosclerosis of native arteries of extremities with rest pain, unspecified extremity: Secondary | ICD-10-CM

## 2019-08-03 DIAGNOSIS — I70221 Atherosclerosis of native arteries of extremities with rest pain, right leg: Secondary | ICD-10-CM | POA: Diagnosis not present

## 2019-08-03 DIAGNOSIS — Z7984 Long term (current) use of oral hypoglycemic drugs: Secondary | ICD-10-CM | POA: Diagnosis not present

## 2019-08-03 DIAGNOSIS — I739 Peripheral vascular disease, unspecified: Secondary | ICD-10-CM

## 2019-08-03 DIAGNOSIS — I70211 Atherosclerosis of native arteries of extremities with intermittent claudication, right leg: Secondary | ICD-10-CM | POA: Diagnosis not present

## 2019-08-03 DIAGNOSIS — E1152 Type 2 diabetes mellitus with diabetic peripheral angiopathy with gangrene: Secondary | ICD-10-CM | POA: Diagnosis not present

## 2019-08-03 DIAGNOSIS — I998 Other disorder of circulatory system: Secondary | ICD-10-CM | POA: Diagnosis present

## 2019-08-03 DIAGNOSIS — Z955 Presence of coronary angioplasty implant and graft: Secondary | ICD-10-CM | POA: Diagnosis not present

## 2019-08-03 DIAGNOSIS — E663 Overweight: Secondary | ICD-10-CM | POA: Diagnosis not present

## 2019-08-03 DIAGNOSIS — E1142 Type 2 diabetes mellitus with diabetic polyneuropathy: Secondary | ICD-10-CM | POA: Insufficient documentation

## 2019-08-03 DIAGNOSIS — Z79899 Other long term (current) drug therapy: Secondary | ICD-10-CM | POA: Insufficient documentation

## 2019-08-03 DIAGNOSIS — I6523 Occlusion and stenosis of bilateral carotid arteries: Secondary | ICD-10-CM

## 2019-08-03 DIAGNOSIS — I10 Essential (primary) hypertension: Secondary | ICD-10-CM | POA: Diagnosis not present

## 2019-08-03 DIAGNOSIS — Z7982 Long term (current) use of aspirin: Secondary | ICD-10-CM | POA: Insufficient documentation

## 2019-08-03 DIAGNOSIS — E785 Hyperlipidemia, unspecified: Secondary | ICD-10-CM | POA: Diagnosis present

## 2019-08-03 HISTORY — DX: Atherosclerosis of native arteries of extremities with rest pain, unspecified extremity: I70.229

## 2019-08-03 HISTORY — PX: PERIPHERAL VASCULAR ATHERECTOMY: CATH118256

## 2019-08-03 HISTORY — PX: PERIPHERAL VASCULAR BALLOON ANGIOPLASTY: CATH118281

## 2019-08-03 HISTORY — PX: LOWER EXTREMITY ANGIOGRAPHY: CATH118251

## 2019-08-03 LAB — CBC
HCT: 38.1 % (ref 36.0–46.0)
Hemoglobin: 12 g/dL (ref 12.0–15.0)
MCH: 28.6 pg (ref 26.0–34.0)
MCHC: 31.5 g/dL (ref 30.0–36.0)
MCV: 90.9 fL (ref 80.0–100.0)
Platelets: 709 10*3/uL — ABNORMAL HIGH (ref 150–400)
RBC: 4.19 MIL/uL (ref 3.87–5.11)
RDW: 13.4 % (ref 11.5–15.5)
WBC: 16.1 10*3/uL — ABNORMAL HIGH (ref 4.0–10.5)
nRBC: 0 % (ref 0.0–0.2)

## 2019-08-03 LAB — BASIC METABOLIC PANEL
Anion gap: 14 (ref 5–15)
BUN: 22 mg/dL (ref 8–23)
CO2: 25 mmol/L (ref 22–32)
Calcium: 10.7 mg/dL — ABNORMAL HIGH (ref 8.9–10.3)
Chloride: 95 mmol/L — ABNORMAL LOW (ref 98–111)
Creatinine, Ser: 1.03 mg/dL — ABNORMAL HIGH (ref 0.44–1.00)
GFR calc Af Amer: >60 mL/min (ref >60)
GFR calc non Af Amer: 52 mL/min — ABNORMAL LOW (ref >60)
Glucose, Bld: 156 mg/dL — ABNORMAL HIGH (ref 70–99)
Potassium: 4.2 mmol/L (ref 3.5–5.1)
Sodium: 134 mmol/L — ABNORMAL LOW (ref 135–145)

## 2019-08-03 LAB — GLUCOSE, CAPILLARY
Glucose-Capillary: 138 mg/dL — ABNORMAL HIGH (ref 70–99)
Glucose-Capillary: 155 mg/dL — ABNORMAL HIGH (ref 70–99)
Glucose-Capillary: 170 mg/dL — ABNORMAL HIGH (ref 70–99)

## 2019-08-03 LAB — POCT ACTIVATED CLOTTING TIME
Activated Clotting Time: 213 seconds
Activated Clotting Time: 263 seconds
Activated Clotting Time: 296 seconds
Activated Clotting Time: 208 seconds
Activated Clotting Time: 180 seconds

## 2019-08-03 SURGERY — LOWER EXTREMITY ANGIOGRAPHY
Anesthesia: LOCAL | Laterality: Right

## 2019-08-03 MED ORDER — SODIUM CHLORIDE 0.9 % WEIGHT BASED INFUSION: 1.0000 mL/kg/h | INTRAVENOUS | Status: DC

## 2019-08-03 MED ORDER — METOPROLOL TARTRATE 100 MG PO TABS: 100.0000 mg | ORAL_TABLET | Freq: Every evening | ORAL | Status: DC

## 2019-08-03 MED ORDER — LABETALOL HCL 5 MG/ML IV SOLN: 10.0000 mg | INTRAVENOUS | Status: DC | PRN

## 2019-08-03 MED ORDER — MIDAZOLAM HCL 2 MG/2ML IJ SOLN
INTRAMUSCULAR | Status: AC
  Filled 2019-08-03: qty 2

## 2019-08-03 MED ORDER — SODIUM CHLORIDE 0.9% FLUSH: 3.0000 mL | INTRAVENOUS | Status: DC | PRN

## 2019-08-03 MED ORDER — NITROGLYCERIN IN D5W 200-5 MCG/ML-% IV SOLN
INTRAVENOUS | Status: AC
  Filled 2019-08-03: qty 250

## 2019-08-03 MED ORDER — LIDOCAINE HCL (PF) 1 % IJ SOLN
INTRAMUSCULAR | Status: DC | PRN
  Administered 2019-08-03: 29 mL

## 2019-08-03 MED ORDER — SODIUM CHLORIDE 0.9 % IV SOLN: INTRAVENOUS | Status: AC

## 2019-08-03 MED ORDER — IRBESARTAN 150 MG PO TABS
150.0000 mg | ORAL_TABLET | Freq: Every day | ORAL | Status: DC
  Administered 2019-08-04: 10:00:00 150 mg via ORAL
  Filled 2019-08-03: qty 1

## 2019-08-03 MED ORDER — ASPIRIN EC 81 MG PO TBEC
81.0000 mg | DELAYED_RELEASE_TABLET | Freq: Every day | ORAL | Status: DC
  Administered 2019-08-04: 10:00:00 81 mg via ORAL
  Filled 2019-08-03: qty 1

## 2019-08-03 MED ORDER — HEPARIN SODIUM (PORCINE) 1000 UNIT/ML IJ SOLN
INTRAMUSCULAR | Status: DC | PRN
  Administered 2019-08-03: 3000 [IU] via INTRAVENOUS
  Administered 2019-08-03: 4000 [IU] via INTRAVENOUS
  Administered 2019-08-03: 6000 [IU] via INTRAVENOUS

## 2019-08-03 MED ORDER — HEPARIN (PORCINE) IN NACL 1000-0.9 UT/500ML-% IV SOLN
INTRAVENOUS | Status: AC
  Filled 2019-08-03: qty 1000

## 2019-08-03 MED ORDER — ACETAMINOPHEN 325 MG PO TABS
650.0000 mg | ORAL_TABLET | ORAL | Status: DC | PRN
  Administered 2019-08-04: 650 mg via ORAL
  Filled 2019-08-03: qty 2

## 2019-08-03 MED ORDER — NITROGLYCERIN 1 MG/10 ML FOR IR/CATH LAB
INTRA_ARTERIAL | Status: DC | PRN
  Administered 2019-08-03 (×4): 200 ug

## 2019-08-03 MED ORDER — SODIUM CHLORIDE 0.9% FLUSH
3.0000 mL | Freq: Two times a day (BID) | INTRAVENOUS | Status: DC
  Administered 2019-08-03: 22:00:00 via INTRAVENOUS

## 2019-08-03 MED ORDER — CLOPIDOGREL BISULFATE 75 MG PO TABS
75.0000 mg | ORAL_TABLET | Freq: Every day | ORAL | Status: DC
  Administered 2019-08-04: 10:00:00 75 mg via ORAL
  Filled 2019-08-03: qty 1

## 2019-08-03 MED ORDER — DILTIAZEM HCL ER COATED BEADS 240 MG PO CP24
240.0000 mg | ORAL_CAPSULE | Freq: Every day | ORAL | Status: DC
  Administered 2019-08-04: 10:00:00 240 mg via ORAL
  Filled 2019-08-03: qty 1

## 2019-08-03 MED ORDER — IODIXANOL 320 MG/ML IV SOLN
INTRAVENOUS | Status: DC | PRN
  Administered 2019-08-03: 100 mL via INTRA_ARTERIAL

## 2019-08-03 MED ORDER — ONDANSETRON HCL 4 MG/2ML IJ SOLN: 4.0000 mg | Freq: Four times a day (QID) | INTRAMUSCULAR | Status: DC | PRN

## 2019-08-03 MED ORDER — FENTANYL CITRATE (PF) 100 MCG/2ML IJ SOLN
INTRAMUSCULAR | Status: DC | PRN
  Administered 2019-08-03 (×3): 25 ug via INTRAVENOUS

## 2019-08-03 MED ORDER — GLIPIZIDE ER 5 MG PO TB24
5.0000 mg | ORAL_TABLET | Freq: Every day | ORAL | Status: DC
  Administered 2019-08-04: 10:00:00 5 mg via ORAL
  Filled 2019-08-03: qty 1

## 2019-08-03 MED ORDER — SODIUM CHLORIDE 0.9 % WEIGHT BASED INFUSION
3.0000 mL/kg/h | INTRAVENOUS | Status: DC
  Administered 2019-08-03: 10:00:00 3 mL/kg/h via INTRAVENOUS

## 2019-08-03 MED ORDER — VERAPAMIL HCL 2.5 MG/ML IV SOLN
INTRAVENOUS | Status: AC
  Filled 2019-08-03: qty 2

## 2019-08-03 MED ORDER — ASPIRIN 81 MG PO TABS: 81.0000 mg | ORAL_TABLET | Freq: Every evening | ORAL | Status: DC

## 2019-08-03 MED ORDER — HEPARIN SODIUM (PORCINE) 1000 UNIT/ML IJ SOLN
INTRAMUSCULAR | Status: AC
  Filled 2019-08-03: qty 1

## 2019-08-03 MED ORDER — HEPARIN (PORCINE) IN NACL 1000-0.9 UT/500ML-% IV SOLN
INTRAVENOUS | Status: DC | PRN
  Administered 2019-08-03 (×2): 500 mL

## 2019-08-03 MED ORDER — ATORVASTATIN CALCIUM 10 MG PO TABS
20.0000 mg | ORAL_TABLET | Freq: Every day | ORAL | Status: DC
  Administered 2019-08-03 – 2019-08-04 (×2): 20 mg via ORAL
  Filled 2019-08-03 (×2): qty 2

## 2019-08-03 MED ORDER — HYDROCHLOROTHIAZIDE 25 MG PO TABS
25.0000 mg | ORAL_TABLET | Freq: Every day | ORAL | Status: DC
  Administered 2019-08-04: 25 mg via ORAL
  Filled 2019-08-03: qty 1

## 2019-08-03 MED ORDER — SODIUM CHLORIDE 0.9 % IV SOLN: 250.0000 mL | INTRAVENOUS | Status: DC | PRN

## 2019-08-03 MED ORDER — CLOPIDOGREL BISULFATE 75 MG PO TABS: 75.0000 mg | ORAL_TABLET | Freq: Every day | ORAL | Status: DC

## 2019-08-03 MED ORDER — METOPROLOL TARTRATE 50 MG PO TABS: 50.0000 mg | ORAL_TABLET | ORAL | Status: DC

## 2019-08-03 MED ORDER — MIDAZOLAM HCL 2 MG/2ML IJ SOLN
INTRAMUSCULAR | Status: DC | PRN
  Administered 2019-08-03 (×2): 1 mg via INTRAVENOUS

## 2019-08-03 MED ORDER — LIDOCAINE HCL (PF) 1 % IJ SOLN
INTRAMUSCULAR | Status: AC
  Filled 2019-08-03: qty 30

## 2019-08-03 MED ORDER — ASPIRIN 81 MG PO CHEW: 81.0000 mg | CHEWABLE_TABLET | ORAL | Status: DC

## 2019-08-03 MED ORDER — VIPERSLIDE LUBRICANT OPTIME
TOPICAL | Status: DC | PRN
  Administered 2019-08-03: 17:00:00 via SURGICAL_CAVITY

## 2019-08-03 MED ORDER — PANTOPRAZOLE SODIUM 40 MG PO TBEC
40.0000 mg | DELAYED_RELEASE_TABLET | Freq: Every day | ORAL | Status: DC
  Administered 2019-08-03 – 2019-08-04 (×2): 40 mg via ORAL
  Filled 2019-08-03 (×2): qty 1

## 2019-08-03 MED ORDER — NITROGLYCERIN 0.2 MG/HR TD PT24
0.2000 mg | MEDICATED_PATCH | Freq: Every day | TRANSDERMAL | Status: DC
  Administered 2019-08-04: 10:00:00 0.2 mg via TRANSDERMAL
  Filled 2019-08-03: qty 1

## 2019-08-03 MED ORDER — HYDRALAZINE HCL 20 MG/ML IJ SOLN: 5.0000 mg | INTRAMUSCULAR | Status: DC | PRN

## 2019-08-03 MED ORDER — MORPHINE SULFATE (PF) 2 MG/ML IV SOLN: 2.0000 mg | INTRAVENOUS | Status: DC | PRN

## 2019-08-03 MED ORDER — FENTANYL CITRATE (PF) 100 MCG/2ML IJ SOLN
INTRAMUSCULAR | Status: AC
  Filled 2019-08-03: qty 2

## 2019-08-03 MED ORDER — METOPROLOL TARTRATE 50 MG PO TABS
50.0000 mg | ORAL_TABLET | Freq: Every morning | ORAL | Status: DC
  Administered 2019-08-04: 50 mg via ORAL
  Filled 2019-08-03: qty 1

## 2019-08-03 SURGICAL SUPPLY — 29 items
BAG SNAP BAND KOVER 36X36 (MISCELLANEOUS) ×3 IMPLANT
BALLN IN.PACT DCB 4X40 (BALLOONS) ×3
BALLN IN.PACT DCB 5X150 (BALLOONS) ×3
BUR DBK EXCH CART 2.00 SOL 145 (BURR) ×2 IMPLANT
BURRDBK EXCH CART 2.00 SOL 145 (BURR) ×3
CATH CROSS OVER TEMPO 5F (CATHETERS) ×3 IMPLANT
CATH CXI SUPP 2.6F 150 ST (CATHETERS) ×3 IMPLANT
CROWN 1.25 MICRO 145 DIAMONDBK (BURR) ×3 IMPLANT
DCB IN.PACT 4X40 (BALLOONS) ×2 IMPLANT
DCB IN.PACT 5X150 (BALLOONS) ×2 IMPLANT
DEVICE EMBOSHIELD NAV6 2.5-4.8 (FILTER) ×3 IMPLANT
GUIDEWIRE ANGLED .035X150CM (WIRE) ×3 IMPLANT
GUIDEWIRE REGALIA .014X300CM (WIRE) ×3 IMPLANT
KIT ENCORE 26 ADVANTAGE (KITS) ×3 IMPLANT
KIT PV (KITS) ×3 IMPLANT
LUBRICANT VIPERSLIDE CORONARY (MISCELLANEOUS) ×3 IMPLANT
SHEATH HIGHFLEX ANSEL 7FR 55CM (SHEATH) ×3 IMPLANT
SHEATH PINNACLE 5F 10CM (SHEATH) ×3 IMPLANT
SHEATH PINNACLE 7F 10CM (SHEATH) ×3 IMPLANT
STOPCOCK MORSE 400PSI 3WAY (MISCELLANEOUS) ×3 IMPLANT
SYR MEDRAD MARK 7 150ML (SYRINGE) ×3 IMPLANT
TAPE VIPERTRACK RADIOPAQ (MISCELLANEOUS) ×4 IMPLANT
TAPE VIPERTRACK RADIOPAQUE (MISCELLANEOUS) ×2
TRANSDUCER W/STOPCOCK (MISCELLANEOUS) ×3 IMPLANT
TRAY PV CATH (CUSTOM PROCEDURE TRAY) ×3 IMPLANT
TUBING CIL FLEX 10 FLL-RA (TUBING) ×3 IMPLANT
WIRE HITORQ VERSACORE ST 145CM (WIRE) ×3 IMPLANT
WIRE ROSEN-J .035X260CM (WIRE) ×3 IMPLANT
WIRE VIPER ADVANCE .017X335CM (WIRE) ×3 IMPLANT

## 2019-08-03 NOTE — Interval H&P Note (Signed)
History and Physical Interval Note:  08/03/2019 2:32 PM  [name]  has presented today for surgery, with the diagnosis of pad.  The various methods of treatment have been discussed with the patient and family. After consideration of risks, benefits and other options for treatment, the patient has consented to  Procedure(s): LOWER EXTREMITY ANGIOGRAPHY (N/A) as a surgical intervention.  The patient's history has been reviewed, patient examined, no change in status, stable for surgery.  I have reviewed the patient's chart and labs.  Questions were answered to the patient's satisfaction.     [name]

## 2019-08-03 NOTE — Progress Notes (Addendum)
7 FR Left femoral artery sheath removed at 17:52, manual pressure held for 25 minutes mild bruising present around the insertion site. Site dressed with gauze and tegaderm. Bedrest to start at 18:20

## 2019-08-04 ENCOUNTER — Telehealth: Payer: Self-pay | Admitting: *Deleted

## 2019-08-04 ENCOUNTER — Encounter (HOSPITAL_COMMUNITY): Payer: Self-pay | Admitting: Cardiovascular Disease

## 2019-08-04 DIAGNOSIS — E1152 Type 2 diabetes mellitus with diabetic peripheral angiopathy with gangrene: Secondary | ICD-10-CM | POA: Diagnosis not present

## 2019-08-04 DIAGNOSIS — I998 Other disorder of circulatory system: Secondary | ICD-10-CM | POA: Diagnosis not present

## 2019-08-04 DIAGNOSIS — I739 Peripheral vascular disease, unspecified: Secondary | ICD-10-CM | POA: Diagnosis not present

## 2019-08-04 DIAGNOSIS — E785 Hyperlipidemia, unspecified: Secondary | ICD-10-CM | POA: Diagnosis not present

## 2019-08-04 DIAGNOSIS — I10 Essential (primary) hypertension: Secondary | ICD-10-CM | POA: Diagnosis not present

## 2019-08-04 DIAGNOSIS — Z01812 Encounter for preprocedural laboratory examination: Secondary | ICD-10-CM

## 2019-08-04 DIAGNOSIS — E875 Hyperkalemia: Secondary | ICD-10-CM

## 2019-08-04 DIAGNOSIS — I70221 Atherosclerosis of native arteries of extremities with rest pain, right leg: Secondary | ICD-10-CM | POA: Diagnosis not present

## 2019-08-04 DIAGNOSIS — E1142 Type 2 diabetes mellitus with diabetic polyneuropathy: Secondary | ICD-10-CM | POA: Diagnosis not present

## 2019-08-04 DIAGNOSIS — Z7902 Long term (current) use of antithrombotics/antiplatelets: Secondary | ICD-10-CM | POA: Diagnosis not present

## 2019-08-04 LAB — BASIC METABOLIC PANEL
Anion gap: 10 (ref 5–15)
BUN: 19 mg/dL (ref 8–23)
CO2: 24 mmol/L (ref 22–32)
Calcium: 9.3 mg/dL (ref 8.9–10.3)
Chloride: 103 mmol/L (ref 98–111)
Creatinine, Ser: 0.73 mg/dL (ref 0.44–1.00)
GFR calc Af Amer: >60 mL/min (ref >60)
GFR calc non Af Amer: >60 mL/min (ref >60)
Glucose, Bld: 127 mg/dL — ABNORMAL HIGH (ref 70–99)
Potassium: 3.8 mmol/L (ref 3.5–5.1)
Sodium: 137 mmol/L (ref 135–145)

## 2019-08-04 LAB — CBC
HCT: 31.4 % — ABNORMAL LOW (ref 36.0–46.0)
Hemoglobin: 10.2 g/dL — ABNORMAL LOW (ref 12.0–15.0)
MCH: 28.9 pg (ref 26.0–34.0)
MCHC: 32.5 g/dL (ref 30.0–36.0)
MCV: 89 fL (ref 80.0–100.0)
Platelets: 479 10*3/uL — ABNORMAL HIGH (ref 150–400)
RBC: 3.53 MIL/uL — ABNORMAL LOW (ref 3.87–5.11)
RDW: 13.3 % (ref 11.5–15.5)
WBC: 15.7 10*3/uL — ABNORMAL HIGH (ref 4.0–10.5)
nRBC: 0 % (ref 0.0–0.2)

## 2019-08-04 LAB — GLUCOSE, CAPILLARY: Glucose-Capillary: 145 mg/dL — ABNORMAL HIGH (ref 70–99)

## 2019-08-04 MED ORDER — PANTOPRAZOLE SODIUM 40 MG PO TBEC: 40.0000 mg | DELAYED_RELEASE_TABLET | Freq: Every day | ORAL | 3 refills | Status: DC

## 2019-08-04 NOTE — Discharge Summary (Addendum)
Discharge Summary    Patient ID: [name] MRN: [id number]; DOB: [birth year]  Admit date: 08/03/2019 Discharge date: 08/04/2019  Primary Care Provider: [name], MD  Primary Cardiologist: [name], MD  Primary Electrophysiologist:  None   Discharge Diagnoses    Active Problems:   Essential hypertension   Dyslipidemia   PAD (peripheral artery disease) [facility])   Critical lower limb ischemia   Diagnostic Studies/Procedures    Peripheral vascular catheterization 08/03/19 Final Impression: Successful diamondback orbital rotational atherectomy of high-grade calcified mid and distal right SFA stenosis, right popliteal stenosis and tibioperoneal stenosis followed by [facility] using distal protection in the peroneal artery and the patient with one-vessel runoff and critical limb ischemia.  She is 1-1/2-week status post right femoral endarterectomy/patch put patch angioplasty by Dr. Trula Slade.  The patch looks excellent.  The sheath will be removed once the ACT falls below 170 pressure held.  Patient will be hydrated overnight, discharged home in the morning.  We will get lower extremity arterial Doppler studies in our [facility] line office next week and I will see her back 1 to 2 weeks thereafter. _____________   History of Present Illness     [name] is a 78 y.o. female with pmh of HTN, DM2, hyperlipidemia who was admitted 08/03/19 for PAD intervention of the right side. Previously patient had nonhealing wounds on both feet. Dopplers showed ABI Right 0.75 and left 2.79 with moderately severe right SFA stenosis and an occluded right posterior tibial artery. She had been following with Dr. Allegra Lai for aggressive wound care. Repeat ABIs showed 0.6 range with high grade right SFA stenosis and occluded left popliteal with tibial vessel disease. Patient was having claudication symptoms as well. On 06/15/19 she had failed attempt at left SFA CTO. The patient developed gangrenous toes of the  right foot (first and second). She was then referred to Dr. Trula Slade who performed successful right common femoral endarterectomy with patch angioplasty 07/21/19.  Hospital Course     Consultants: None  The patient arrived to [facility]19 for planned orbital atherectomy and PTA of her calcified right SFA and intervention on the right tibioperoneal trunk. Labs the day of procedure showed creatinine 1.03, glucose 156, WBC 16.1, Hgb 12.0. COVID negative. The patient was taken back to the catheterization lab and access was established in the left groin. She underwent successful orbital rotational atherectomy of high grade calcified SFA stenosis, right popliteal stenosis and tibioperoneal stenosis followed by [facility] using distal protection in the peroneal artery and the patient with one vessel runoff. The patient remained stable throughout the procedure. Post procedure recommendations for ASA and Plavix for 12 months. Left groin site remained clean and dry with minimal ecchymosis. Patient was ambulating about the room with no symptoms. Labs showed creatinine 0.73 and Hgb 10.2. Patient noted that she will be needing amputation of the right first and second gangrenous toes on Friday. Will plan to discharge with Aspirin and plavix for 12 months. Will continue all other home medications on discharge.   Patient was seen and examined by Dr. Meda Coffee on 08/04/19 and felt to be stable for discharge. Follow-up was arranged.   Did the patient have an acute coronary syndrome (MI, NSTEMI, STEMI, etc) this admission?:  No                               Did the patient have a percutaneous coronary intervention (stent /  angioplasty)?:  No.   _____________  Discharge Vitals Blood pressure (!) 125/54, pulse 86, temperature 98.2 F (36.8 C), temperature source Oral, resp. rate 15, height 5\' 4"  (1.626 m), weight 61.7 kg, SpO2 100 %.  Filed Weights   08/03/19 0935  Weight: 61.7 kg    Labs & Radiologic Studies      CBC Recent Labs    08/03/19 0936 08/04/19 0335  WBC 16.1* 15.7*  HGB 12.0 10.2*  HCT 38.1 31.4*  MCV 90.9 89.0  PLT 709* 673*   Basic Metabolic Panel Recent Labs    08/03/19 0936 08/04/19 0335  NA 134* 137  K 4.2 3.8  CL 95* 103  CO2 25 24  GLUCOSE 156* 127*  BUN 22 19  CREATININE 1.03* 0.73  CALCIUM 10.7* 9.3   Liver Function Tests No results for input(s): AST, ALT, ALKPHOS, BILITOT, PROT, ALBUMIN in the last 72 hours. No results for input(s): LIPASE, AMYLASE in the last 72 hours. High Sensitivity Troponin:   No results for input(s): TROPONINIHS in the last 720 hours.  BNP Invalid input(s): POCBNP D-Dimer No results for input(s): DDIMER in the last 72 hours. Hemoglobin A1C No results for input(s): HGBA1C in the last 72 hours. Fasting Lipid Panel No results for input(s): CHOL, HDL, LDLCALC, TRIG, CHOLHDL, LDLDIRECT in the last 72 hours. Thyroid Function Tests No results for input(s): TSH, T4TOTAL, T3FREE, THYROIDAB in the last 72 hours.  Invalid input(s): FREET3 _____________  No results found. Disposition   Pt is being discharged home today in good condition.  Follow-up Plans & Appointments    Follow-up Information    [name], MD Follow up on 08/18/2019.   Specialties: Cardiology, Radiology Why: Please go to hospital follow up November 24th at 1:30 PM Contact information: [address] [place] [address] [phone]          Discharge Instructions    Call MD for:  difficulty breathing, headache or visual disturbances   Complete by: As directed    Call MD for:  extreme fatigue   Complete by: As directed    Call MD for:  persistant nausea and vomiting   Complete by: As directed    Call MD for:  redness, tenderness, or signs of infection (pain, swelling, redness, odor or green/yellow discharge around incision site)   Complete by: As directed    Call MD for:  severe uncontrolled pain   Complete by: As directed     Call MD for:  temperature >100.4   Complete by: As directed    Diet - low sodium heart healthy   Complete by: As directed    Discharge instructions   Complete by: As directed    Do not take metformin for 2 days post procedure. Please restart it 08/06/19.   Increase activity slowly   Complete by: As directed       Discharge Medications   Allergies as of 08/04/2019      Reactions   Demerol [meperidine]    Delusional    Scopolamine    Delusional       Medication List    STOP taking these medications   omeprazole 20 MG capsule Commonly known as: PRILOSEC Replaced by: pantoprazole 40 MG tablet     TAKE these medications   acetaminophen 500 MG tablet Commonly known as: TYLENOL Take 1,000 mg by mouth every 6 (six) hours as needed for moderate pain or headache.   aspirin 81 MG tablet Take 81 mg by  mouth every evening.   atorvastatin 20 MG tablet Commonly known as: LIPITOR Take 1 tablet by mouth once daily What changed: when to take this   calcium carbonate 500 MG chewable tablet Commonly known as: TUMS - dosed in mg elemental calcium Chew 2 tablets by mouth daily as needed for indigestion or heartburn.   CINNAMON PO Take 1,000 mg by mouth 3 (three) times daily with meals.   clopidogrel 75 MG tablet Commonly known as: PLAVIX Take 1 tablet (75 mg total) by mouth daily.   diltiazem 240 MG 24 hr capsule Commonly known as: Cartia XT Take 1 capsule (240 mg total) by mouth daily.   fish oil-omega-3 fatty acids 1000 MG capsule Take 1 g by mouth 4 (four) times a week. Sun, Mon, Wed, Fri at night   glipiZIDE 5 MG 24 hr tablet Commonly known as: GLUCOTROL XL Take 1 tablet by mouth once daily with breakfast   hydrochlorothiazide 25 MG tablet Commonly known as: HYDRODIURIL Take 1 tablet (25 mg total) by mouth daily.   Invokana 100 MG Tabs tablet Generic drug: canagliflozin TAKE 1 TABLET BY MOUTH BEFORE BREAKFAST   Magnesium 250 MG Tabs Take 250 mg by mouth  daily with supper.   metFORMIN 1000 MG tablet Commonly known as: GLUCOPHAGE TAKE 1 TABLET BY MOUTH TWICE DAILY WITH A MEAL What changed: See the new instructions.   metoprolol tartrate 100 MG tablet Commonly known as: LOPRESSOR Take half tab in the morning and a whole tab in the evening. What changed:   how much to take  how to take this  when to take this  additional instructions   nitroGLYCERIN 0.2 mg/hr patch Commonly known as: Nitro-Dur Place 1 patch (0.2 mg total) onto the skin daily. Apply behind right toe daily   oxyCODONE 5 MG immediate release tablet Commonly known as: Oxy IR/ROXICODONE Take 1 tablet (5 mg total) by mouth every 6 (six) hours as needed for moderate pain.   Ozempic (0.25 or 0.5 MG/DOSE) 2 MG/1.5ML Sopn Generic drug: Semaglutide(0.25 or 0.5MG /DOS) INJECT 0.5MG  INTO THE SKIN ONCE A WEEK   pantoprazole 40 MG tablet Commonly known as: PROTONIX Take 1 tablet (40 mg total) by mouth daily. Start taking on: August 05, 2019 Replaces: omeprazole 20 MG capsule   telmisartan 80 MG tablet Commonly known as: MICARDIS Take 1 tablet (80 mg total) by mouth daily. What changed:   how much to take  when to take this   vitamin B-12 1000 MCG tablet Commonly known as: CYANOCOBALAMIN Take 1,000 mcg by mouth daily with supper.   vitamin C 1000 MG tablet Take 1,000 mg by mouth daily with supper.          Outstanding Labs/Studies   None  Duration of Discharge Encounter   Greater than 30 minutes including physician time.  Signed, Duward Allbritton Ninfa Meeker, PA-C 08/04/2019, 2:13 PM

## 2019-08-04 NOTE — Telephone Encounter (Signed)
Dr. Jacqualyn Posey wanted me to give you a call.  He wants to do your surgery on Friday, August 07, 2019.  Is this date going to be okay for you?  "I take Plavix and the doctor told me not to have any surgeries right now."  We sometimes do surgeries without having the patients stop their blood thinner.  "Well, they just told me not to have any surgeries.  Can you let Dr. Jacqualyn Posey know this and see what he says and give me a call back."  Yes, I will let Dr. Jacqualyn Posey know.  I'll call you back.  I am calling you back.  Dr. Jacqualyn Posey said he spoke to Dr. Gwenlyn Found and he gave him the okay to proceed with the surgery.  "Oh okay, as long as he said it's okay."  I will get the surgery scheduled.  "Will I need another Covid test done?"  When did you have it done?  "I had it done last week."  Yes, you will need another one.  Someone from pre-testing will give you a call so you can schedule an appointment.  "Will I still be released today and then be readmitted on Friday?"  Yes, you will go home as scheduled today and be re-admitted on Friday.  "Good!"  Dr. Jacqualyn Posey wants to see you on Thursday for a consultation prior to your surgery date.  "Okay, that's fine."  I can schedule your for 08/06/2019 at 4:15 pm.  "That will be fine."  We'll see you then.

## 2019-08-04 NOTE — Progress Notes (Addendum)
Progress Note  Patient Name: [name] Date of Encounter: 08/04/2019  Primary Cardiologist: [name], MD   Subjective   Patient is doing well this morning. Left groin site is clean and dry. Walking about the room with no symptoms.   Inpatient Medications    Scheduled Meds:  aspirin EC  81 mg Oral Daily   atorvastatin  20 mg Oral Daily   clopidogrel  75 mg Oral Q breakfast   diltiazem  240 mg Oral Daily   glipiZIDE  5 mg Oral Q breakfast   hydrochlorothiazide  25 mg Oral Daily   irbesartan  150 mg Oral Daily   metoprolol tartrate  100 mg Oral QPM   metoprolol tartrate  50 mg Oral q morning - 10a   nitroGLYCERIN  0.2 mg Transdermal Daily   pantoprazole  40 mg Oral Daily   sodium chloride flush  3 mL Intravenous Q12H   Continuous Infusions:  sodium chloride     sodium chloride     PRN Meds: sodium chloride, acetaminophen, hydrALAZINE, labetalol, morphine injection, ondansetron (ZOFRAN) IV, sodium chloride flush   Vital Signs    Vitals:   08/03/19 1850 08/03/19 1855 08/03/19 1900 08/03/19 1924  BP: (!) 118/39 (!) 112/47 (!) 127/33   Pulse: 87 88 89   Resp: 10 10 15    Temp:    98.2 F (36.8 C)  TempSrc:    Oral  SpO2: 100% 99% 99%   Weight:      Height:       No intake or output data in the 24 hours ending 08/04/19 0757 Last 3 Weights 08/03/2019 07/28/2019 07/21/2019  Weight (lbs) 136 lb 138 lb 6.4 oz 139 lb 12.8 oz  Weight (kg) 61.689 kg 62.778 kg 63.413 kg      Telemetry    NSR, HR 90s, frequent PVCs - Personally Reviewed  ECG    No new - Personally Reviewed  Physical Exam   GEN: No acute distress.   Neck: No JVD Cardiac: RRR, no murmurs, rubs, or gallops.  Respiratory: Clear to auscultation bilaterally. GI: Soft, nontender, non-distended  MS: No edema; right groin with very small open healing wound; left groin clean and dry with mild ecchymosis, nontender; right gangrenous toes  Neuro:  Nonfocal  Psych: Normal affect    Labs    High Sensitivity Troponin:  No results for input(s): TROPONINIHS in the last 720 hours.    Chemistry Recent Labs  Lab 07/30/19 1107 08/03/19 0936 08/04/19 0335  NA 137 134* 137  K 5.7* 4.2 3.8  CL 96 95* 103  CO2 24 25 24   GLUCOSE 207* 156* 127*  BUN 22 22 19   CREATININE 0.84 1.03* 0.73  CALCIUM 10.3 10.7* 9.3  GFRNONAA 67 52* >60  GFRAA 78 >60 >60  ANIONGAP  --  14 10     Hematology Recent Labs  Lab 07/28/19 1439 08/03/19 0936 08/04/19 0335  WBC 16.3* 16.1* 15.7*  RBC 3.83 4.19 3.53*  HGB 10.7* 12.0 10.2*  HCT 33.3* 38.1 31.4*  MCV 87 90.9 89.0  MCH 27.9 28.6 28.9  MCHC 32.1 31.5 32.5  RDW 12.3 13.4 13.3  PLT 728* 709* 479*    BNPNo results for input(s): BNP, PROBNP in the last 168 hours.   DDimer No results for input(s): DDIMER in the last 168 hours.   Radiology    No results found.  Cardiac Studies   Peripheral vascular catheterization Final Impression: Successful diamondback orbital rotational atherectomy of high-grade calcified  mid and distal right SFA stenosis, right popliteal stenosis and tibioperoneal stenosis followed by [facility] using distal protection in the peroneal artery and the patient with one-vessel runoff and critical limb ischemia.  She is 1-1/2-week status post right femoral endarterectomy/patch put patch angioplasty by Dr. Trula Slade.  The patch looks excellent.  The sheath will be removed once the ACT falls below 170 pressure held.  Patient will be hydrated overnight, discharged home in the morning.  We will get lower extremity arterial Doppler studies in our [facility] line office next week and I will see her back 1 to 2 weeks thereafter.   Patient Profile     78 y.o. female with a pmh of HTN, DM2, hyperlipidemia with nonhealing wounds and recent dopplers showing right ABI 0.75 and left 2.79 with previous failed left attempt at [facility] and right common femoral endarterectomy with patch angioplasty 07/21/19 with Dr. Trula Slade who was admitted  yesterday  for further intervention of the right side by Dr. Gwenlyn Found.  Assessment & Plan    PAD - Patient had critical limb ischemia with failed attempt at left SFA intervention 06/15/19. She underwent right common femoral endarterectomy with patch angioplasty by Dr. Trula Slade 07/21/19 for gangrenous toes. - patient underwent successful orbital rotational atherectomy of high grade calcified SFA stenosis, right popliteal stenosis and tibioperoneal stenosis followed by [facility] using distal protection in the peroneal artery and the patient with one vessel runoff. - Left groin clean and dry with mild ecchymosis, nontender - Walking about her room with no symptoms - creatinine 0.73. Hgb 10.2 - Plan for follow up in the office - Continue ASA and Plavix for 12 months - Patient is a possible discharge today, however she mentioned she needs a amputation of gangrenous toes on the right foot and the surgeon might request she stay for this.   HLD - continue atorvastatin 20 mg  HTN - continue HCTZ, diltiazem 240 mg daily,  Lopressor 150 mg daily, irbesartan 150 mg daily - Pressures stable  For questions or updates, please contact [place] Please consult [email or web address] for contact info under   Signed, Cadence Ninfa Meeker, PA-C  08/04/2019, 7:57 AM    The patient was seen, examined and discussed with Cadence Ninfa Meeker, PA-C   and I agree with the above.   The patient is feeling well, she has no LE edema, pain, both extremities are warm, Left groin - access site has mild hematoma but no bruit or pain, Hb stable at 10.2, Crea 0.73, K down to 3.8.  She is going to be seen by surgeon/podiatrist for RLE gangrene, if no intervention, we will discharge today and arrange an OP follow up in our clinic.  Ena Dawley, MD 08/04/2019

## 2019-08-05 ENCOUNTER — Encounter (HOSPITAL_COMMUNITY): Payer: Medicare Other

## 2019-08-05 ENCOUNTER — Other Ambulatory Visit (HOSPITAL_COMMUNITY)
Admission: RE | Admit: 2019-08-05 | Discharge: 2019-08-05 | Disposition: A | Discharge status: Home / Self Care | Payer: Medicare Other | Source: Ambulatory Visit | Attending: Podiatry | Admitting: Podiatry

## 2019-08-05 ENCOUNTER — Inpatient Hospital Stay (HOSPITAL_COMMUNITY): Admission: RE | Admit: 2019-08-05 | Payer: Medicare Other | Source: Ambulatory Visit

## 2019-08-05 ENCOUNTER — Encounter (HOSPITAL_COMMUNITY): Payer: Self-pay | Admitting: Cardiovascular Disease

## 2019-08-05 DIAGNOSIS — Z20828 Contact with and (suspected) exposure to other viral communicable diseases: Secondary | ICD-10-CM | POA: Insufficient documentation

## 2019-08-05 DIAGNOSIS — Z01812 Encounter for preprocedural laboratory examination: Secondary | ICD-10-CM | POA: Insufficient documentation

## 2019-08-05 LAB — SARS CORONAVIRUS 2 (TAT 6-24 HRS): SARS Coronavirus 2: NEGATIVE

## 2019-08-06 ENCOUNTER — Telehealth: Payer: Self-pay | Admitting: *Deleted

## 2019-08-06 ENCOUNTER — Encounter (HOSPITAL_COMMUNITY): Payer: Self-pay

## 2019-08-06 ENCOUNTER — Other Ambulatory Visit: Payer: Self-pay

## 2019-08-06 ENCOUNTER — Ambulatory Visit (INDEPENDENT_AMBULATORY_CARE_PROVIDER_SITE_OTHER): Payer: Medicare Other | Admitting: Podiatry

## 2019-08-06 DIAGNOSIS — I739 Peripheral vascular disease, unspecified: Secondary | ICD-10-CM | POA: Diagnosis not present

## 2019-08-06 DIAGNOSIS — I96 Gangrene, not elsewhere classified: Secondary | ICD-10-CM

## 2019-08-06 DIAGNOSIS — I6523 Occlusion and stenosis of bilateral carotid arteries: Secondary | ICD-10-CM

## 2019-08-06 NOTE — Telephone Encounter (Signed)
"  Dr. Jacqualyn Posey is going to do an amputation of my toes on my right foot at [facility].  The coordinator I just talked with would like to find out if Dr. Jacqualyn Posey wants me to take my 81 mg Aspirin or my Plavix before procedure tomorrow either today or in the morning.  Please call me back."

## 2019-08-06 NOTE — Progress Notes (Signed)
Pt. preop call completed. Pt. Instructed to call Dr. Jacqualyn Posey to inquire about Plavix & asa. She verbalizes understanding. Pt. Aware & familiar with pre op instruction. Will present to surg. SSC at 0930 tomorrow. Also reviewed pre op instructions specific for TYPE 2 DM

## 2019-08-06 NOTE — Telephone Encounter (Signed)
Yes that should be fine

## 2019-08-06 NOTE — Telephone Encounter (Signed)
I am returning your call.  Dr. Jacqualyn Posey said it was okay for your to take the Aspirin and the Plavix.  "That's what I needed to know."

## 2019-08-06 NOTE — Patient Instructions (Signed)
Pre-Operative Instructions  Congratulations, you have decided to take an important step to improving your quality of life.  You can be assured that the doctors of [facility] will be with you every step of the way.  1. Plan to be at the surgery center/hospital at least 1 (one) hour prior to your scheduled time unless otherwise directed by the surgical center/hospital staff.  You must have a responsible adult accompany you, remain during the surgery and drive you home.  Make sure you have directions to the surgical center/hospital and know how to get there on time. 2. For hospital based surgery you will need to obtain a history and physical form from your family physician within 1 month prior to the date of surgery- we will give you a form for you primary physician.  3. We make every effort to accommodate the date you request for surgery.  There are however, times where surgery dates or times have to be moved.  We will contact you as soon as possible if a change in schedule is required.   4. No Aspirin/Ibuprofen for one week before surgery.  If you are on aspirin, any non-steroidal anti-inflammatory medications (Mobic, Aleve, Ibuprofen) you should stop taking it 7 days prior to your surgery.  You make take Tylenol  For pain prior to surgery.  5. Medications- If you are taking daily heart and blood pressure medications, seizure, reflux, allergy, asthma, anxiety, pain or diabetes medications, make sure the surgery center/hospital is aware before the day of surgery so they may notify you which medications to take or avoid the day of surgery. 6. No food or drink after midnight the night before surgery unless directed otherwise by surgical center/hospital staff. 7. No alcoholic beverages 24 hours prior to surgery.  No smoking 24 hours prior to or 24 hours after surgery. 8. Wear loose pants or shorts- loose enough to fit over bandages, boots, and casts. 9. No slip on shoes, sneakers are best. 10. Bring  your boot with you to the surgery center/hospital.  Also bring crutches or a walker if your physician has prescribed it for you.  If you do not have this equipment, it will be provided for you after surgery. 11. If you have not been contracted by the surgery center/hospital by the day before your surgery, call to confirm the date and time of your surgery. 12. Leave-time from work may vary depending on the type of surgery you have.  Appropriate arrangements should be made prior to surgery with your employer. 13. Prescriptions will be provided immediately following surgery by your doctor.  Have these filled as soon as possible after surgery and take the medication as directed. 14. Remove nail polish on the operative foot. 15. Wash the night before surgery.  The night before surgery wash the foot and leg well with the antibacterial soap provided and water paying special attention to beneath the toenails and in between the toes.  Rinse thoroughly with water and dry well with a towel.  Perform this wash unless told not to do so by your physician.  Enclosed: 1 Ice pack (please put in freezer the night before surgery)   1 Hibiclens skin cleaner   Pre-op Instructions  If you have any questions regarding the instructions, do not hesitate to call our office at any point during this process.   [place]: [address] [place], [place] [address] [phone]  [place]: [address] [place], [place] [address] [phone]  Dr. Daeton Kluth, DPM  

## 2019-08-07 ENCOUNTER — Inpatient Hospital Stay (HOSPITAL_COMMUNITY)
Admission: RE | Admit: 2019-08-07 | Discharge: 2019-08-09 | DRG: 241 | Disposition: A | Discharge status: Home Health | Payer: Medicare Other | Attending: Internal Medicine | Admitting: Internal Medicine

## 2019-08-07 ENCOUNTER — Ambulatory Visit (HOSPITAL_COMMUNITY): Payer: Medicare Other | Admitting: Physician Assistant

## 2019-08-07 ENCOUNTER — Encounter (HOSPITAL_COMMUNITY): Payer: Self-pay

## 2019-08-07 ENCOUNTER — Encounter (HOSPITAL_COMMUNITY)
Admission: RE | Disposition: A | Discharge status: Home / Self Care | Payer: Self-pay | Source: Home / Self Care | Attending: Internal Medicine

## 2019-08-07 DIAGNOSIS — E1142 Type 2 diabetes mellitus with diabetic polyneuropathy: Secondary | ICD-10-CM | POA: Diagnosis not present

## 2019-08-07 DIAGNOSIS — I10 Essential (primary) hypertension: Secondary | ICD-10-CM | POA: Diagnosis not present

## 2019-08-07 DIAGNOSIS — Z7982 Long term (current) use of aspirin: Secondary | ICD-10-CM | POA: Diagnosis not present

## 2019-08-07 DIAGNOSIS — Z87891 Personal history of nicotine dependence: Secondary | ICD-10-CM | POA: Diagnosis not present

## 2019-08-07 DIAGNOSIS — Z89439 Acquired absence of unspecified foot: Secondary | ICD-10-CM

## 2019-08-07 DIAGNOSIS — I96 Gangrene, not elsewhere classified: Secondary | ICD-10-CM

## 2019-08-07 DIAGNOSIS — Z20828 Contact with and (suspected) exposure to other viral communicable diseases: Secondary | ICD-10-CM | POA: Diagnosis present

## 2019-08-07 DIAGNOSIS — Z79899 Other long term (current) drug therapy: Secondary | ICD-10-CM

## 2019-08-07 DIAGNOSIS — Z823 Family history of stroke: Secondary | ICD-10-CM

## 2019-08-07 DIAGNOSIS — E785 Hyperlipidemia, unspecified: Secondary | ICD-10-CM | POA: Diagnosis present

## 2019-08-07 DIAGNOSIS — E1152 Type 2 diabetes mellitus with diabetic peripheral angiopathy with gangrene: Principal | ICD-10-CM | POA: Diagnosis present

## 2019-08-07 DIAGNOSIS — Z8249 Family history of ischemic heart disease and other diseases of the circulatory system: Secondary | ICD-10-CM | POA: Diagnosis not present

## 2019-08-07 DIAGNOSIS — D649 Anemia, unspecified: Secondary | ICD-10-CM | POA: Diagnosis present

## 2019-08-07 DIAGNOSIS — K219 Gastro-esophageal reflux disease without esophagitis: Secondary | ICD-10-CM | POA: Diagnosis present

## 2019-08-07 DIAGNOSIS — Z7984 Long term (current) use of oral hypoglycemic drugs: Secondary | ICD-10-CM | POA: Diagnosis not present

## 2019-08-07 DIAGNOSIS — Z8349 Family history of other endocrine, nutritional and metabolic diseases: Secondary | ICD-10-CM | POA: Diagnosis not present

## 2019-08-07 DIAGNOSIS — E119 Type 2 diabetes mellitus without complications: Secondary | ICD-10-CM

## 2019-08-07 DIAGNOSIS — I739 Peripheral vascular disease, unspecified: Secondary | ICD-10-CM | POA: Diagnosis not present

## 2019-08-07 DIAGNOSIS — N1831 Chronic kidney disease, stage 3a: Secondary | ICD-10-CM

## 2019-08-07 DIAGNOSIS — Z89431 Acquired absence of right foot: Secondary | ICD-10-CM

## 2019-08-07 HISTORY — PX: TRANSMETATARSAL AMPUTATION: SHX6197

## 2019-08-07 HISTORY — DX: Gastro-esophageal reflux disease without esophagitis: K21.9

## 2019-08-07 HISTORY — DX: Malignant (primary) neoplasm, unspecified: C80.1

## 2019-08-07 HISTORY — DX: Gangrene, not elsewhere classified: I96

## 2019-08-07 HISTORY — DX: Other complications of anesthesia, initial encounter: T88.59XA

## 2019-08-07 HISTORY — DX: Acquired absence of unspecified foot: Z89.439

## 2019-08-07 LAB — GLUCOSE, CAPILLARY
Glucose-Capillary: 160 mg/dL — ABNORMAL HIGH (ref 70–99)
Glucose-Capillary: 134 mg/dL — ABNORMAL HIGH (ref 70–99)
Glucose-Capillary: 178 mg/dL — ABNORMAL HIGH (ref 70–99)
Glucose-Capillary: 163 mg/dL — ABNORMAL HIGH (ref 70–99)

## 2019-08-07 LAB — CBC
HCT: 31.9 % — ABNORMAL LOW (ref 36.0–46.0)
Hemoglobin: 10.4 g/dL — ABNORMAL LOW (ref 12.0–15.0)
MCH: 29.3 pg (ref 26.0–34.0)
MCHC: 32.6 g/dL (ref 30.0–36.0)
MCV: 89.9 fL (ref 80.0–100.0)
Platelets: 502 10*3/uL — ABNORMAL HIGH (ref 150–400)
RBC: 3.55 MIL/uL — ABNORMAL LOW (ref 3.87–5.11)
RDW: 14 % (ref 11.5–15.5)
WBC: 11.5 10*3/uL — ABNORMAL HIGH (ref 4.0–10.5)
nRBC: 0 % (ref 0.0–0.2)

## 2019-08-07 SURGERY — AMPUTATION, FOOT, TRANSMETATARSAL
Anesthesia: General | Site: Toe | Laterality: Right

## 2019-08-07 MED ORDER — CHLORHEXIDINE GLUCONATE CLOTH 2 % EX PADS: 6.0000 | MEDICATED_PAD | Freq: Once | CUTANEOUS | Status: DC

## 2019-08-07 MED ORDER — KETOROLAC TROMETHAMINE 30 MG/ML IJ SOLN
INTRAMUSCULAR | Status: AC
  Filled 2019-08-07: qty 1

## 2019-08-07 MED ORDER — LIDOCAINE HCL (PF) 1 % IJ SOLN
INTRAMUSCULAR | Status: AC
  Filled 2019-08-07: qty 30

## 2019-08-07 MED ORDER — LACTATED RINGERS IV SOLN
INTRAVENOUS | Status: DC
  Administered 2019-08-07: 10:00:00 via INTRAVENOUS

## 2019-08-07 MED ORDER — MIDAZOLAM HCL 2 MG/2ML IJ SOLN
INTRAMUSCULAR | Status: AC
  Filled 2019-08-07: qty 2

## 2019-08-07 MED ORDER — LIDOCAINE 2% (20 MG/ML) 5 ML SYRINGE
INTRAMUSCULAR | Status: AC
  Filled 2019-08-07: qty 5

## 2019-08-07 MED ORDER — ACETAMINOPHEN 650 MG RE SUPP: 650.0000 mg | Freq: Four times a day (QID) | RECTAL | Status: DC | PRN

## 2019-08-07 MED ORDER — ENOXAPARIN SODIUM 40 MG/0.4ML ~~LOC~~ SOLN
40.0000 mg | SUBCUTANEOUS | Status: DC
  Administered 2019-08-07 – 2019-08-08 (×2): 40 mg via SUBCUTANEOUS
  Filled 2019-08-07 (×2): qty 0.4

## 2019-08-07 MED ORDER — LIDOCAINE HCL (PF) 1 % IJ SOLN
INTRAMUSCULAR | Status: DC | PRN
  Administered 2019-08-07: 5 mL

## 2019-08-07 MED ORDER — ROPIVACAINE HCL 5 MG/ML IJ SOLN
INTRAMUSCULAR | Status: DC | PRN
  Administered 2019-08-07: 30 mL via PERINEURAL

## 2019-08-07 MED ORDER — 0.9 % SODIUM CHLORIDE (POUR BTL) OPTIME
TOPICAL | Status: DC | PRN
  Administered 2019-08-07: 1000 mL

## 2019-08-07 MED ORDER — LIDOCAINE 1 % OPTIME INJ - NO CHARGE
INTRAMUSCULAR | Status: DC | PRN
  Administered 2019-08-07: 20 mL

## 2019-08-07 MED ORDER — PROPOFOL 10 MG/ML IV BOLUS
INTRAVENOUS | Status: DC | PRN
  Administered 2019-08-07: 10 mg via INTRAVENOUS
  Administered 2019-08-07: 20 mg via INTRAVENOUS

## 2019-08-07 MED ORDER — CLOPIDOGREL BISULFATE 75 MG PO TABS: 75.0000 mg | ORAL_TABLET | Freq: Every day | ORAL | Status: DC

## 2019-08-07 MED ORDER — FENTANYL CITRATE (PF) 250 MCG/5ML IJ SOLN
INTRAMUSCULAR | Status: AC
  Filled 2019-08-07: qty 5

## 2019-08-07 MED ORDER — METOPROLOL TARTRATE 50 MG PO TABS
50.0000 mg | ORAL_TABLET | Freq: Every day | ORAL | Status: DC
  Administered 2019-08-07 – 2019-08-09 (×3): 50 mg via ORAL
  Filled 2019-08-07 (×2): qty 1

## 2019-08-07 MED ORDER — ATORVASTATIN CALCIUM 10 MG PO TABS
20.0000 mg | ORAL_TABLET | Freq: Every evening | ORAL | Status: DC
  Administered 2019-08-07 – 2019-08-08 (×2): 20 mg via ORAL
  Filled 2019-08-07 (×2): qty 2

## 2019-08-07 MED ORDER — IRBESARTAN 150 MG PO TABS: 150.0000 mg | ORAL_TABLET | Freq: Every day | ORAL | Status: DC

## 2019-08-07 MED ORDER — PANTOPRAZOLE SODIUM 40 MG PO TBEC
40.0000 mg | DELAYED_RELEASE_TABLET | Freq: Every day | ORAL | Status: DC
  Administered 2019-08-07 – 2019-08-09 (×3): 40 mg via ORAL
  Filled 2019-08-07 (×3): qty 1

## 2019-08-07 MED ORDER — ALBUTEROL SULFATE (2.5 MG/3ML) 0.083% IN NEBU: 2.5000 mg | INHALATION_SOLUTION | Freq: Four times a day (QID) | RESPIRATORY_TRACT | Status: DC | PRN

## 2019-08-07 MED ORDER — PROPOFOL 1000 MG/100ML IV EMUL
INTRAVENOUS | Status: AC
  Filled 2019-08-07: qty 100

## 2019-08-07 MED ORDER — CALCIUM CARBONATE ANTACID 500 MG PO CHEW: 2.0000 | CHEWABLE_TABLET | Freq: Every day | ORAL | Status: DC | PRN

## 2019-08-07 MED ORDER — ONDANSETRON HCL 4 MG/2ML IJ SOLN
INTRAMUSCULAR | Status: DC | PRN
  Administered 2019-08-07: 4 mg via INTRAVENOUS

## 2019-08-07 MED ORDER — ACETAMINOPHEN 325 MG PO TABS
650.0000 mg | ORAL_TABLET | Freq: Four times a day (QID) | ORAL | Status: DC | PRN
  Administered 2019-08-07 – 2019-08-09 (×5): 650 mg via ORAL
  Filled 2019-08-07 (×5): qty 2

## 2019-08-07 MED ORDER — INSULIN ASPART 100 UNIT/ML ~~LOC~~ SOLN
0.0000 [IU] | Freq: Three times a day (TID) | SUBCUTANEOUS | Status: DC
  Administered 2019-08-07: 19:00:00 2 [IU] via SUBCUTANEOUS
  Administered 2019-08-08: 3 [IU] via SUBCUTANEOUS
  Administered 2019-08-08: 1 [IU] via SUBCUTANEOUS
  Administered 2019-08-08: 2 [IU] via SUBCUTANEOUS
  Administered 2019-08-09: 3 [IU] via SUBCUTANEOUS
  Administered 2019-08-09: 2 [IU] via SUBCUTANEOUS

## 2019-08-07 MED ORDER — PROPOFOL 500 MG/50ML IV EMUL
INTRAVENOUS | Status: DC | PRN
  Administered 2019-08-07: 50 ug/kg/min via INTRAVENOUS

## 2019-08-07 MED ORDER — PHENYLEPHRINE 40 MCG/ML (10ML) SYRINGE FOR IV PUSH (FOR BLOOD PRESSURE SUPPORT)
PREFILLED_SYRINGE | INTRAVENOUS | Status: AC
  Filled 2019-08-07: qty 10

## 2019-08-07 MED ORDER — ASPIRIN 81 MG PO CHEW
81.0000 mg | CHEWABLE_TABLET | Freq: Every evening | ORAL | Status: DC
  Administered 2019-08-07 – 2019-08-08 (×2): 81 mg via ORAL
  Filled 2019-08-07 (×2): qty 1

## 2019-08-07 MED ORDER — CLOPIDOGREL BISULFATE 75 MG PO TABS
75.0000 mg | ORAL_TABLET | Freq: Every day | ORAL | Status: DC
  Administered 2019-08-07 – 2019-08-09 (×3): 75 mg via ORAL
  Filled 2019-08-07 (×3): qty 1

## 2019-08-07 MED ORDER — TRAZODONE HCL 50 MG PO TABS
50.0000 mg | ORAL_TABLET | Freq: Once | ORAL | Status: AC
  Administered 2019-08-07: 50 mg via ORAL
  Filled 2019-08-07: qty 1

## 2019-08-07 MED ORDER — BUPIVACAINE HCL (PF) 0.5 % IJ SOLN
INTRAMUSCULAR | Status: AC
  Filled 2019-08-07: qty 30

## 2019-08-07 MED ORDER — FENTANYL CITRATE (PF) 100 MCG/2ML IJ SOLN
INTRAMUSCULAR | Status: AC
  Administered 2019-08-07: 100 ug via INTRAVENOUS
  Filled 2019-08-07: qty 2

## 2019-08-07 MED ORDER — ONDANSETRON HCL 4 MG/2ML IJ SOLN: 4.0000 mg | Freq: Four times a day (QID) | INTRAMUSCULAR | Status: DC | PRN

## 2019-08-07 MED ORDER — DILTIAZEM HCL ER COATED BEADS 240 MG PO CP24
240.0000 mg | ORAL_CAPSULE | Freq: Every day | ORAL | Status: DC
  Administered 2019-08-08 – 2019-08-09 (×2): 240 mg via ORAL
  Filled 2019-08-07: qty 2
  Filled 2019-08-07: qty 1
  Filled 2019-08-07: qty 2
  Filled 2019-08-07: qty 1

## 2019-08-07 MED ORDER — DEXAMETHASONE SODIUM PHOSPHATE 10 MG/ML IJ SOLN
INTRAMUSCULAR | Status: AC
  Filled 2019-08-07: qty 1

## 2019-08-07 MED ORDER — FENTANYL CITRATE (PF) 100 MCG/2ML IJ SOLN
100.0000 ug | Freq: Once | INTRAMUSCULAR | Status: AC
  Administered 2019-08-07: 12:00:00 100 ug via INTRAVENOUS

## 2019-08-07 MED ORDER — MAGNESIUM OXIDE 400 (241.3 MG) MG PO TABS
200.0000 mg | ORAL_TABLET | Freq: Every day | ORAL | Status: DC
  Administered 2019-08-07 – 2019-08-08 (×2): 200 mg via ORAL
  Filled 2019-08-07 (×2): qty 1

## 2019-08-07 MED ORDER — ONDANSETRON HCL 4 MG PO TABS: 4.0000 mg | ORAL_TABLET | Freq: Four times a day (QID) | ORAL | Status: DC | PRN

## 2019-08-07 MED ORDER — ONDANSETRON HCL 4 MG/2ML IJ SOLN
INTRAMUSCULAR | Status: AC
  Filled 2019-08-07: qty 6

## 2019-08-07 MED ORDER — CEFAZOLIN SODIUM-DEXTROSE 2-4 GM/100ML-% IV SOLN
2.0000 g | Freq: Three times a day (TID) | INTRAVENOUS | Status: AC
  Administered 2019-08-07: 2 g via INTRAVENOUS
  Filled 2019-08-07: qty 100

## 2019-08-07 MED ORDER — FENTANYL CITRATE (PF) 100 MCG/2ML IJ SOLN: 25.0000 ug | INTRAMUSCULAR | Status: DC | PRN

## 2019-08-07 MED ORDER — CEFAZOLIN SODIUM-DEXTROSE 2-4 GM/100ML-% IV SOLN
INTRAVENOUS | Status: AC
  Filled 2019-08-07: qty 100

## 2019-08-07 MED ORDER — BUPIVACAINE HCL 0.5 % IJ SOLN
INTRAMUSCULAR | Status: DC | PRN
  Administered 2019-08-07: 30 mL

## 2019-08-07 MED ORDER — CEFAZOLIN SODIUM-DEXTROSE 2-4 GM/100ML-% IV SOLN
2.0000 g | INTRAVENOUS | Status: AC
  Administered 2019-08-07: 2 g via INTRAVENOUS

## 2019-08-07 MED ORDER — ONDANSETRON HCL 4 MG/2ML IJ SOLN: 4.0000 mg | Freq: Once | INTRAMUSCULAR | Status: DC | PRN

## 2019-08-07 MED ORDER — HYDROCHLOROTHIAZIDE 25 MG PO TABS: 25.0000 mg | ORAL_TABLET | Freq: Every day | ORAL | Status: DC

## 2019-08-07 MED ORDER — OXYCODONE HCL 5 MG PO TABS
5.0000 mg | ORAL_TABLET | Freq: Four times a day (QID) | ORAL | Status: DC | PRN
  Administered 2019-08-07 – 2019-08-09 (×6): 5 mg via ORAL
  Filled 2019-08-07 (×6): qty 1

## 2019-08-07 MED ORDER — MORPHINE SULFATE (PF) 2 MG/ML IV SOLN
2.0000 mg | Freq: Once | INTRAVENOUS | Status: AC
  Administered 2019-08-08: 2 mg via INTRAVENOUS
  Filled 2019-08-07: qty 1

## 2019-08-07 MED ORDER — METOPROLOL TARTRATE 50 MG PO TABS
100.0000 mg | ORAL_TABLET | Freq: Every day | ORAL | Status: DC
  Administered 2019-08-08: 100 mg via ORAL
  Filled 2019-08-07 (×3): qty 2

## 2019-08-07 SURGICAL SUPPLY — 51 items
BANDAGE ESMARK 6X9 LF (GAUZE/BANDAGES/DRESSINGS) IMPLANT
BLADE AVERAGE 25MMX9MM (BLADE) ×1
BLADE AVERAGE 25X9 (BLADE) ×2 IMPLANT
BLADE SURG 10 STRL SS (BLADE) ×3 IMPLANT
BNDG COHESIVE 4X5 TAN STRL (GAUZE/BANDAGES/DRESSINGS) ×3 IMPLANT
BNDG ELASTIC 4X5.8 VLCR STR LF (GAUZE/BANDAGES/DRESSINGS) ×3 IMPLANT
BNDG ESMARK 6X9 LF (GAUZE/BANDAGES/DRESSINGS)
COVER WAND RF STERILE (DRAPES) ×3 IMPLANT
CUFF TOURN SGL QUICK 34 (TOURNIQUET CUFF)
CUFF TOURN SGL QUICK 42 (TOURNIQUET CUFF) IMPLANT
CUFF TRNQT CYL 34X4.125X (TOURNIQUET CUFF) IMPLANT
DECANTER SPIKE VIAL GLASS SM (MISCELLANEOUS) ×3 IMPLANT
DRAPE INCISE IOBAN 66X45 STRL (DRAPES) ×3 IMPLANT
DRAPE U-SHAPE 47X51 STRL (DRAPES) ×6 IMPLANT
DURAPREP 26ML APPLICATOR (WOUND CARE) ×3 IMPLANT
ELECT REM PT RETURN 9FT ADLT (ELECTROSURGICAL) ×3
ELECTRODE REM PT RTRN 9FT ADLT (ELECTROSURGICAL) ×1 IMPLANT
GAUZE SPONGE 4X4 12PLY STRL (GAUZE/BANDAGES/DRESSINGS) ×3 IMPLANT
GAUZE XEROFORM 5X9 LF (GAUZE/BANDAGES/DRESSINGS) ×3 IMPLANT
GLOVE BIO SURGEON STRL SZ8 (GLOVE) ×3 IMPLANT
GLOVE BIOGEL PI IND STRL 8 (GLOVE) ×1 IMPLANT
GLOVE BIOGEL PI INDICATOR 8 (GLOVE) ×2
GLOVE ORTHO TXT STRL SZ7.5 (GLOVE) ×3 IMPLANT
GOWN STRL REUS W/ TWL LRG LVL3 (GOWN DISPOSABLE) ×1 IMPLANT
GOWN STRL REUS W/ TWL XL LVL3 (GOWN DISPOSABLE) ×4 IMPLANT
GOWN STRL REUS W/TWL LRG LVL3 (GOWN DISPOSABLE) ×2
GOWN STRL REUS W/TWL XL LVL3 (GOWN DISPOSABLE) ×8
KIT BASIN OR (CUSTOM PROCEDURE TRAY) ×3 IMPLANT
KIT DRSG PREVENA PLUS 7DAY 125 (MISCELLANEOUS) ×3 IMPLANT
KIT PREVENA INCISION MGT 13 (CANNISTER) ×3 IMPLANT
KIT TURNOVER KIT B (KITS) ×3 IMPLANT
NEEDLE 22X1 1/2 (OR ONLY) (NEEDLE) ×3 IMPLANT
NEEDLE 25GX 5/8IN NON SAFETY (NEEDLE) ×3 IMPLANT
NS IRRIG 1000ML POUR BTL (IV SOLUTION) ×3 IMPLANT
PACK ORTHO EXTREMITY (CUSTOM PROCEDURE TRAY) ×3 IMPLANT
PAD ARMBOARD 7.5X6 YLW CONV (MISCELLANEOUS) ×6 IMPLANT
PAD CAST 4YDX4 CTTN HI CHSV (CAST SUPPLIES) ×1 IMPLANT
PADDING CAST COTTON 4X4 STRL (CAST SUPPLIES) ×2
SPONGE LAP 18X18 RF (DISPOSABLE) ×6 IMPLANT
STAPLER VISISTAT 35W (STAPLE) ×6 IMPLANT
STOCKINETTE IMPERVIOUS LG (DRAPES) IMPLANT
SUT ETHILON 2 0 PSLX (SUTURE) ×6 IMPLANT
SWAB COLLECTION DEVICE MRSA (MISCELLANEOUS) IMPLANT
SWAB CULTURE ESWAB REG 1ML (MISCELLANEOUS) IMPLANT
SYR CONTROL 10ML LL (SYRINGE) ×3 IMPLANT
TOWEL GREEN STERILE (TOWEL DISPOSABLE) ×3 IMPLANT
TOWEL GREEN STERILE FF (TOWEL DISPOSABLE) ×3 IMPLANT
TUBE CONNECTING 12'X1/4 (SUCTIONS) ×1
TUBE CONNECTING 12X1/4 (SUCTIONS) ×2 IMPLANT
WATER STERILE IRR 1000ML POUR (IV SOLUTION) ×3 IMPLANT
YANKAUER SUCT BULB TIP NO VENT (SUCTIONS) ×3 IMPLANT

## 2019-08-07 NOTE — Anesthesia Preprocedure Evaluation (Addendum)
Anesthesia Evaluation  Patient identified by MRN, date of birth, ID band Patient awake    Reviewed: Allergy & Precautions, NPO status , Patient's Chart, lab work & pertinent test results, reviewed documented beta blocker date and time   Airway Mallampati: II  TM Distance: >3 FB Neck ROM: Full    Dental no notable dental hx. (+) Caps, Teeth Intact, Dental Advisory Given   Pulmonary pneumonia, resolved, former smoker,  Quit smoking 1973   Pulmonary exam normal breath sounds clear to auscultation       Cardiovascular hypertension, Pt. on medications and Pt. on home beta blockers + Peripheral Vascular Disease  Normal cardiovascular exam+ Valvular Problems/Murmurs  Rhythm:Regular Rate:Normal  Critical ischemia right leg Bilateral carotid stenosis <50%  Echo 2013: EF>55% Borderline concentric left ventricular hypertrophy. There is a small calcified mass in the L:A near the LA appendage. No valvular masses seen with associated mitral annular calcification. LA Volume/ BSA27.4 ml/m2 No AS. Right ventricular systolic pressure is elevated at 44mHg.   Neuro/Psych negative neurological ROS  negative psych ROS   GI/Hepatic Neg liver ROS, hiatal hernia, GERD  Controlled and Medicated,  Endo/Other  diabetes, Well Controlled, Type 2, Oral Hypoglycemic AgentsHyperlipidemia  Renal/GU negative Renal ROS  negative genitourinary   Musculoskeletal  (+) Arthritis , Osteoarthritis,    Abdominal Normal abdominal exam  (+)   Peds  Hematology negative hematology ROS (+)   Anesthesia Other Findings Bad reaction to versed  Reproductive/Obstetrics negative OB ROS                            Anesthesia Physical  Anesthesia Plan  ASA: III  Anesthesia Plan: Regional and MAC   Post-op Pain Management:  Regional for Post-op pain   Induction: Intravenous  PONV Risk Score and Plan: 2 and Treatment may vary due to age  or medical condition, Propofol infusion and TIVA  Airway Management Planned: Simple Face Mask and Natural Airway  Additional Equipment: None  Intra-op Plan:   Post-operative Plan:   Informed Consent: I have reviewed the patients History and Physical, chart, labs and discussed the procedure including the risks, benefits and alternatives for the proposed anesthesia with the patient or authorized representative who has indicated his/her understanding and acceptance.       Plan Discussed with: CRNA  Anesthesia Plan Comments:       Anesthesia Quick Evaluation

## 2019-08-07 NOTE — Anesthesia Procedure Notes (Signed)
Procedure Name: MAC Date/Time: 08/07/2019 12:08 PM Performed by: Larene Beach, CRNA Pre-anesthesia Checklist: Patient identified, Emergency Drugs available, Suction available and Patient being monitored Patient Re-evaluated:Patient Re-evaluated prior to induction Oxygen Delivery Method: Simple face mask Preoxygenation: Pre-oxygenation with 100% oxygen

## 2019-08-07 NOTE — Transfer of Care (Signed)
Immediate Anesthesia Transfer of Care Note  Patient: [name]  Procedure(s) Performed: TRANSMETATARSAL AMPUTATION (Right Toe)  Patient Location: PACU  Anesthesia Type:MAC and MAC combined with regional for post-op pain  Level of Consciousness: drowsy and patient cooperative  Airway & Oxygen Therapy: Patient Spontanous Breathing  Post-op Assessment: Report given to RN, Post -op Vital signs reviewed and stable and Patient moving all extremities X 4  Post vital signs: Reviewed and stable  Last Vitals:  Vitals Value Taken Time  BP 138/52 08/07/19 1254  Temp    Pulse 70 08/07/19 1255  Resp 13 08/07/19 1255  SpO2 99 % 08/07/19 1255  Vitals shown include unvalidated device data.  Last Pain:  Vitals:   08/07/19 1145  TempSrc:   PainSc: 0-No pain      Patients Stated Pain Goal: 3 (23/55/73 2202)  Complications: No apparent anesthesia complications

## 2019-08-07 NOTE — Brief Op Note (Signed)
08/07/2019  12:48 PM  PATIENT:  [name]  78 y.o. female  PRE-OPERATIVE DIAGNOSIS:  GANGRENE  POST-OPERATIVE DIAGNOSIS:  GANGRENE  PROCEDURE:  Procedure(s): TRANSMETATARSAL AMPUTATION (Right)  SURGEON:  Surgeon(s) and Role:    * Trula Slade, DPM - Primary  PHYSICIAN ASSISTANT:   ASSISTANTS: none   ANESTHESIA:   MAC  EBL:  10 mL   BLOOD ADMINISTERED:none  DRAINS: none   LOCAL MEDICATIONS USED:  NONE  SPECIMEN:  No Specimen  DISPOSITION OF SPECIMEN:  N/A  COUNTS:  YES  TOURNIQUET:  * Missing tourniquet times found for documented tourniquets in log: [id number] *  DICTATION: .Viviann Spare Dictation  PLAN OF CARE: Admit for inpatient stay  PATIENT DISPOSITION:  PACU - hemodynamically stable.   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 TMA preformed. There was no signs of proximal infection. Minimal bleeding. Wound closed without tension. Prevena wound VAC applied to help assist with healing. Admit for inpatient, likely overnight stay. Likely d/c tomorrow pending pain control. PT eval for PWB to heel in CAM boot. Likely needs a walker.   Celesta Gentile, DPM O: [phone] C: [phone]

## 2019-08-07 NOTE — Anesthesia Procedure Notes (Signed)
Anesthesia Regional Block: Adductor canal block   Pre-Anesthetic Checklist: ,, timeout performed, Correct Patient, Correct Site, Correct Laterality, Correct Procedure, Correct Position, site marked, Risks and benefits discussed,  Surgical consent,  Pre-op evaluation,  At surgeon's request and post-op pain management  Laterality: Right  Prep: Maximum Sterile Barrier Precautions used, chloraprep       Needles:  Injection technique: Single-shot  Needle Type: Echogenic Stimulator Needle     Needle Length: 9cm  Needle Gauge: 22     Additional Needles:   Procedures:,,,, ultrasound used (permanent image in chart),,,,  Narrative:  Start time: 08/07/2019 11:40 AM End time: 08/07/2019 11:45 AM Injection made incrementally with aspirations every 5 mL.  Performed by: Personally  Anesthesiologist: Pervis Hocking, DO  Additional Notes: Monitors applied. No increased pain on injection. No increased resistance to injection. Injection made in 5cc increments. Good needle visualization. Patient tolerated procedure well.

## 2019-08-07 NOTE — Anesthesia Postprocedure Evaluation (Signed)
Anesthesia Post Note  Patient: [name]  Procedure(s) Performed: TRANSMETATARSAL AMPUTATION (Right Toe)     Patient location during evaluation: PACU Anesthesia Type: Regional and MAC Level of consciousness: awake and alert Pain management: pain level controlled Vital Signs Assessment: post-procedure vital signs reviewed and stable Respiratory status: spontaneous breathing, nonlabored ventilation, respiratory function stable and patient connected to nasal cannula oxygen Cardiovascular status: blood pressure returned to baseline and stable Postop Assessment: no apparent nausea or vomiting Anesthetic complications: no    Last Vitals:  Vitals:   08/07/19 1315 08/07/19 1324  BP:  (!) 140/50  Pulse: 68 65  Resp: 18 14  Temp:    SpO2: 99% 98%    Last Pain:  Vitals:   08/07/19 1254  TempSrc:   PainSc: 0-No pain                 [name]

## 2019-08-07 NOTE — Op Note (Signed)
PATIENT:  [name]  78 y.o. female  PRE-OPERATIVE DIAGNOSIS:  GANGRENE  POST-OPERATIVE DIAGNOSIS:  GANGRENE  PROCEDURE:  Procedure(s): TRANSMETATARSAL AMPUTATION (Right)  SURGEON:  Surgeon(s) and Role:    * Trula Slade, DPM - Primary  PHYSICIAN ASSISTANT:   ASSISTANTS: none   ANESTHESIA:   MAC  EBL:  10 mL   BLOOD ADMINISTERED:none  DRAINS: none   LOCAL MEDICATIONS USED:  NONE  SPECIMEN:  No Specimen  DISPOSITION OF SPECIMEN:  N/A  COUNTS:  YES  TOURNIQUET:  * Missing tourniquet times found for documented tourniquets in log: [id number] *  DICTATION: .Viviann Spare Dictation  PLAN OF CARE: Admit for inpatient stay  PATIENT DISPOSITION:  PACU - hemodynamically stable.   Delay start of Pharmacological VTE agent (>24hrs) due to surgical blood loss or risk of bleeding: no  Indications for surgery: 78 year old female initially presented the office with blisters on her toes.  She was referred to Dr. Gwenlyn Found for evaluation of circulation.  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  She underwent intervention by both Dr. Trula Slade as well as Dr. Gwenlyn Found.  Distended due to the gangrenous changes she needed to proceed with a transmetatarsal amputation.  Discussed all alternatives, risks, complications.  No promises or guarantees were given affecting the procedure and all questions were answered to the best my ability.  Procedure in detail:  The patient is both verbally and visually identified by myself and nursing staff and the anesthesia staff preoperatively.  We will put she had a nerve block performed by the anesthesia staff.  She was then transferred the operating room via stretcher and placed on the operating table in supine position.  A tourniquet was placed in the right side however of note this was not inflated during the procedure and was applied only in case it is needed.  The  right lower extremities and scrubbed, prepped, draped in normal sterile fashion.  Timeout was performed.   An incision was planned along the forefoot on the right side for transmetatarsal amputation.  Incision was made initially with a 10 blade scalpel.  I utilized a scalpel as well as cautery in order to dissect down to the metatarsals dorsally.  A key elevator was utilized to remove soft tissue from metatarsals 1 through 5.  Next a sagittal bone saw was utilized to resect the metatarsals 1 through 5 making sure to avoid any bony prominence.  At this time the plantar incision was then finished and the forefoot was disarticulated and passed off the table.  Upon further evaluation there is no purulence and there is no proximal tracking.  All nonviable tissue was debrided with a rongeur as well as 15 with scalpel.  All bleeders were cauterized.  There was bleeding during the procedure although minimal.  At this time the incision was irrigated with saline and hemostasis achieved.  Incision was closed with 3-0 Prolene as well as skin staples.  Next a Pravena incisional wound VAC was applied.  She was awoken from anesthesia and found to tolerate the procedure well and transferred to PACU.  She will stay overnight for observation and had physical therapy evaluation and for pain control.

## 2019-08-07 NOTE — Progress Notes (Signed)
Subjective: 78 year old female presents the office today for follow-up evaluation of gangrene to toes on the right foot and for surgical consult for right foot TMA. I have discussed the case with Dr. Gwenlyn Found and she recently underwent a successful arthrectomy of right SFA stenosis. She has gangrenous changes to the right toes and we have previously discussed a TMA. She is scheduled for tomorrow. She presents to further discuss.   Denies any fevers or chills.  No chest pain, shortness of breath.  Had angio with Dr. Gwenlyn Found 06/15/2019 Angiographic Data:   1: Abdominal aorta- renal arteries widely patent.  The infrarenal abdominal aorta was patent as well with fluoroscopic calcification 2: Left lower extremity- there was an 80% fairly focal proximal left SFA stenosis.  The mid left SFA was diffusely 70% segmentally stenosed and calcified.  There was a short segment calcified CTO distal left SFA and above-knee popliteal artery reconstituting with two-vessel runoff.  The posterior tibial was occluded. 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  She has both claudication and nonhealing toe wounds.  We will proceed with attempt at distal left SFA CTO intervention.  07/21/2019 with Dr. Trula Slade Right external iliac, common femoral, profundofemoral, and superficial femoral endarectomy with bone pericardial patch angioplasty with Dr. Trula Slade.   08/03/2019 with Dr. Gwenlyn Found Successful diamondback orbital rotational atherectomy of high-grade calcified mid and distal right SFA stenosis, right popliteal stenosis and tibioperoneal stenosis followed by [facility] using distal protection in the peroneal artery and the patient with  one-vessel runoff and critical limb ischemia.  She is 1-1/2-week status post right femoral endarterectomy/patch put patch angioplasty by Dr. Trula Slade.  The patch looks excellent.  The sheath will be removed once the ACT falls below 170 pressure held.  Patient will be hydrated overnight, discharged home in the morning.  We will get lower extremity arterial Doppler studies in our [facility] line office next week and I will see her back 1 to 2 weeks thereafter.  Objective: AAO x3, NAD Pulses decreased which is unchanged. 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  Superficial areas on on the left fourth and fifth toes. On the sulcus of the right 2nd digit there is an opening and macerated tissue. No drainage or pus.  No fluctuation crepitation. No pain with calf compression, swelling, warmth, erythema.  Assessment: Gangrene right foot.   Plan: -All treatment options discussed with the patient including all alternatives, risks, complications.  -At this time we further discussed further surgical intervention. She needs a TMA at this point. We discussed the surgery and postop course. She wishes to go ahead and proceed tomorrow as scheduled and she states "do whatever you need".  -The incision placement as well as the postoperative course was discussed with the patient. I discussed risks of the surgery which include, but not limited to, infection, bleeding, pain, swelling, need for further surgery, delayed or nonhealing, painful or ugly scar, numbness or sensation changes, transfer lesions, DVT/PE, loss of toe/foot/leg. Also discussed general risks of surgery including stroke, heart attack, death.  Patient understands these risks and wishes to proceed with surgery. The surgical consent was reviewed with the patient all 3 pages were signed. No  promises or guarantees were given to the outcome of the procedure. All questions were answered to the best of my  ability. Before the surgery the patient was encouraged to call the office if there is any further questions.  -Surgery scheduled for [facility]. Will keep overnight for observation.   Trula Slade DPM  -She is scheduled to see Dr. Alvester Chou on Tuesday.  I will discuss with him timing in regards to further intervention of the SFA lesion as well as transmetatarsal amputation.  I discussed the procedure as well as the postoperative course.  I will get in contact with her in regards to timing for surgery.  As of now the toes although gangrenous are stable.  Monitor closely for any signs or symptoms of infection.   Trula Slade DPM

## 2019-08-07 NOTE — Anesthesia Procedure Notes (Signed)
Anesthesia Regional Block: Popliteal block   Pre-Anesthetic Checklist: ,, timeout performed, Correct Patient, Correct Site, Correct Laterality, Correct Procedure, Correct Position, site marked, Risks and benefits discussed,  Surgical consent,  Pre-op evaluation,  At surgeon's request and post-op pain management  Laterality: Right  Prep: Maximum Sterile Barrier Precautions used, chloraprep       Needles:  Injection technique: Single-shot  Needle Type: Echogenic Stimulator Needle     Needle Length: 9cm  Needle Gauge: 22     Additional Needles:   Procedures:,,,, ultrasound used (permanent image in chart),,,,  Narrative:  Start time: 08/07/2019 11:45 AM End time: 08/07/2019 11:50 AM Injection made incrementally with aspirations every 5 mL.  Performed by: Personally  Anesthesiologist: Pervis Hocking, DO  Additional Notes: Monitors applied. No increased pain on injection. No increased resistance to injection. Injection made in 5cc increments. Good needle visualization. Patient tolerated procedure well.

## 2019-08-07 NOTE — H&P (Signed)
Patient presents to [facility] for surgery given gangrene on the right foot. She is scheduled for a TMA. We again discussed the surgery and postop course. She has no further questions or concerns. Surgical consent form signed. NPO. Will plan for RIGHT TMA as scheduled

## 2019-08-07 NOTE — H&P (Signed)
History and Physical    [name]:[id number] DOB: [birth year] DOA: 08/07/2019  Referring MD/NP/PA: Celesta Gentile, D.P.M. PCP: [name], MD  Patient coming from: PACU  Chief Complaint: Infection of the right foot  I have personally briefly reviewed patient's old medical records in [place]   HPI: [name] is a 78 y.o. female with medical history significant of HTN, HLD, DM type II, and PAD.  She presented for surgery giving gangrene of the right foot.   Previously on 9/21, patient had failed attempt at revascularization of the superficial femoral artery.  She has been referred to Dr.Brabham who performed successful right external iliac, common femoral, profundofemoral, and superficial femoral endarterectomy with patch angioplasty on 10/27.  Then she underwent successful diamondback orbital rotational endarterectomy of high-grade calcified  superficial femoral artery, popliteal, and tibioperoneal stenosis on 11/9 with Dr. Gwenlyn Found.  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  She underwent transmetatarsal amputation Dr. Jacqualyn Posey today.  Denies any significant pain issues at this time.  Labs obtained from 11/10 revealed WBC 15.7, hemoglobin 10.2, platelets 479, and BMP within normal limits.  ED Course: As seen above.  Review of Systems:A complete 10 point review of systems was preformed and negative except as noted above.   Past Medical History:  Diagnosis Date  . Arthritis   . Cancer ([place])    removal from nose - MOSE procedure   . Complication of anesthesia    VERSED- agitated, muscle spasms, "jerking" , frantic , (never had this occurence in the pas)   . Diabetes mellitus without complication ([place])   . GERD (gastroesophageal reflux disease)   . Heart murmur   . History of hiatal hernia   . Hyperlipidemia   . Hypertension 11/20/11   ECHO- EF>55% Borderline concentric left ventricular hypertrophy. There is a small calcified mass in the L:A near the LA  appendage. No valvular masses seen with associated mitral annular calcification. LA Volume/ BSA27.4 ml/m2 No AS. Right ventricular systolic pressure is elevated at 39mmHg.  Marland Kitchen Peripheral vascular disease ([place])   . Pneumonia    not hosp.     Past Surgical History:  Procedure Laterality Date  . ABDOMINAL AORTOGRAM N/A 06/15/2019   Procedure: ABDOMINAL AORTOGRAM;  Surgeon: [name], MD;  Location: [facility];  Service: Cardiovascular;  Laterality: N/A;  . APPENDECTOMY    . APPLICATION OF WOUND VAC Right 07/21/2019   Procedure: Application Of Prevena Wound Vac Right Groin;  Surgeon: [name], MD;  Location: [facility];  Service: Vascular;  Laterality: Right;  . CARPAL TUNNEL RELEASE    . CARPAL TUNNEL RELEASE    . CESAREAN SECTION     x 2  . COLONOSCOPY    . ENDARTERECTOMY FEMORAL Right 07/21/2019   Procedure: RIGHT ENDARTERECTOMY FEMORAL WITH PATCH ANGIOPLASTY;  Surgeon: [name], MD;  Location: [facility];  Service: Vascular;  Laterality: Right;  . LOWER EXTREMITY ANGIOGRAPHY Bilateral 06/15/2019   Procedure: Lower Extremity Angiography;  Surgeon: [name], MD;  Location: [facility];  Service: Cardiovascular;  Laterality: Bilateral;  . LOWER EXTREMITY ANGIOGRAPHY Right 08/03/2019   Procedure: LOWER EXTREMITY ANGIOGRAPHY;  Surgeon: [name], MD;  Location: [facility];  Service: Cardiovascular;  Laterality: Right;  . PATCH ANGIOPLASTY Right 07/21/2019   Procedure: Patch Angioplasty Right Femoral Artery;  Surgeon: [name], MD;  Location: [facility];  Service: Vascular;  Laterality: Right;  . PERIPHERAL VASCULAR ATHERECTOMY  08/03/2019   Procedure: PERIPHERAL VASCULAR ATHERECTOMY;  Surgeon: [name], MD;  Location: [facility];  Service: Cardiovascular;;  right SFA, right TP trunk  . PERIPHERAL VASCULAR BALLOON ANGIOPLASTY Left 06/15/2019   Procedure: PERIPHERAL VASCULAR BALLOON ANGIOPLASTY;  Surgeon: [name], MD;   Location: [facility];  Service: Cardiovascular;  Laterality: Left;  SFA UNSUCCESSFUL UNABLE TO CROSS LESION  . PERIPHERAL VASCULAR BALLOON ANGIOPLASTY  08/03/2019   Procedure: PERIPHERAL VASCULAR BALLOON ANGIOPLASTY;  Surgeon: [name], MD;  Location: [facility];  Service: Cardiovascular;;  right SFA, Right TP trunk  . TONSILLECTOMY     and adenoidectomy  . TUBAL LIGATION       reports that she quit smoking about 47 years ago. She has never used smokeless tobacco. She reports that she does not drink alcohol or use drugs.  Allergies  Allergen Reactions  . Bactrim [Sulfamethoxazole-Trimethoprim] Other (See Comments)    ^ K+( elevated)   . Demerol [Meperidine]     Delusional   . Scopolamine     Delusional   . Versed [Midazolam] Anxiety    Frantic, out of my mind, agitated     Family History  Problem Relation Age of Onset  . Cancer - Prostate Father   . Cancer - Colon Father   . Stroke Mother   . Hypertension Mother   . Hyperlipidemia Mother   . Melanoma Brother     Prior to Admission medications   Medication Sig Start Date End Date Taking? Authorizing Provider  acetaminophen (TYLENOL) 500 MG tablet Take 1,000 mg by mouth every 6 (six) hours as needed for moderate pain or headache.   Yes [provider]  Ascorbic Acid (VITAMIN C) 1000 MG tablet Take 1,000 mg by mouth daily with supper.    Yes [provider]  aspirin 81 MG tablet Take 81 mg by mouth every evening.    Yes [provider]  atorvastatin (LIPITOR) 20 MG tablet Take 1 tablet by mouth once daily Patient taking differently: Take 20 mg by mouth every evening.  06/29/19  Yes Hilty, Nadean Corwin, MD  calcium carbonate (TUMS - DOSED IN MG ELEMENTAL CALCIUM) 500 MG chewable tablet Chew 2 tablets by mouth daily as needed for indigestion or heartburn.   Yes [provider]  CINNAMON PO Take 1,000 mg by mouth 3 (three) times daily with meals.    Yes [provider]  diltiazem (CARTIA XT) 240 MG 24 hr capsule Take 1 capsule (240 mg total) by mouth daily. 09/04/18  Yes Hilty, Nadean Corwin, MD  fish oil-omega-3 fatty acids 1000 MG capsule Take 1 g by mouth 4 (four) times a week. Sun, Mon, Wed, Fri at night   Yes [provider]  glipiZIDE (GLUCOTROL XL) 5 MG 24 hr tablet Take 1 tablet by mouth once daily with breakfast 07/22/19  Yes Elayne Snare, MD  hydrochlorothiazide (HYDRODIURIL) 25 MG tablet Take 1 tablet (25 mg total) by mouth daily. 11/19/18  Yes [name], MD  INVOKANA 100 MG TABS tablet TAKE 1 TABLET BY MOUTH BEFORE BREAKFAST 07/22/19  Yes Elayne Snare, MD  Magnesium 250 MG TABS Take 250 mg by mouth daily with supper.   Yes [provider]  metFORMIN (GLUCOPHAGE) 1000 MG tablet TAKE 1 TABLET BY MOUTH TWICE DAILY WITH A MEAL Patient taking differently: Take 1,000 mg by mouth 2 (two) times daily.  05/21/19  Yes Elayne Snare, MD  metoprolol (LOPRESSOR) 100 MG  tablet Take half tab in the morning and a whole tab in the evening. Patient taking differently: Take 50-100 mg by mouth See admin instructions. Take 50 mg in the morning and 100 mg in the evening 07/09/16  Yes Hilty, Nadean Corwin, MD  nitroGLYCERIN (NITRO-DUR) 0.2 mg/hr patch Place 1 patch (0.2 mg total) onto the skin daily. Apply behind right toe daily 07/02/19  Yes Trula Slade, DPM  OZEMPIC, 0.25 OR 0.5 MG/DOSE, 2 MG/1.5ML SOPN INJECT 0.5MG  INTO THE SKIN ONCE A WEEK Patient taking differently: Uses this med. q Sunday 07/22/19  Yes Elayne Snare, MD  pantoprazole (PROTONIX) 40 MG tablet Take 1 tablet (40 mg total) by mouth daily. 08/05/19  Yes Furth, Cadence H, PA-C  telmisartan (MICARDIS) 80 MG tablet Take 1 tablet (80 mg total) by mouth daily. Patient taking differently: Take 40 mg by mouth every evening.  11/19/18  Yes [name], MD  vitamin B-12 (CYANOCOBALAMIN) 1000 MCG tablet Take 1,000 mcg by mouth daily with supper.   Yes [provider]  clopidogrel  (PLAVIX) 75 MG tablet Take 1 tablet (75 mg total) by mouth daily. 07/30/19   [name], MD  oxyCODONE (OXY IR/ROXICODONE) 5 MG immediate release tablet Take 1 tablet (5 mg total) by mouth every 6 (six) hours as needed for moderate pain. 07/22/19   Dagoberto Ligas, PA-C    Physical Exam:  Constitutional: elderly female in NAD, calm, comfortable Vitals:   08/07/19 1309 08/07/19 1310 08/07/19 1315 08/07/19 1324  BP: (!) 130/46   (!) 140/50  Pulse: 67 65 68 65  Resp: (!) 9 11 18 14   Temp:      TempSrc:      SpO2: 99% 99% 99% 98%   Eyes: PERRL, lids and conjunctivae normal ENMT: Mucous membranes are moist. Posterior pharynx clear of any exudate or lesions.Normal dentition.  Neck: normal, supple, no masses, no thyromegaly Respiratory: clear to auscultation bilaterally, no wheezing, no crackles. Normal respiratory effort. No accessory muscle use.  Cardiovascular: Regular rate and rhythm, no murmurs / rubs / gallops. No extremity edema. 1+ pedal pulses. No carotid bruits.  Abdomen: no tenderness, no masses palpated. No hepatosplenomegaly. Bowel sounds positive.  Musculoskeletal: no clubbing / cyanosis. Right foot transmetatarsal amputation Skin: no rashes, lesions, ulcers. No induration Neurologic: CN 2-12 grossly intact. Sensation intact, DTR normal. Strength 5/5 in all 4.  Psychiatric: Normal judgment and insight. Alert and oriented x 3. Normal mood.     Labs on Admission: I have personally reviewed following labs and imaging studies  CBC: Recent Labs  Lab 08/03/19 0936 08/04/19 0335  WBC 16.1* 15.7*  HGB 12.0 10.2*  HCT 38.1 31.4*  MCV 90.9 89.0  PLT 709* 993*   Basic Metabolic Panel: Recent Labs  Lab 08/03/19 0936 08/04/19 0335  NA 134* 137  K 4.2 3.8  CL 95* 103  CO2 25 24  GLUCOSE 156* 127*  BUN 22 19  CREATININE 1.03* 0.73  CALCIUM 10.7* 9.3   GFR: Estimated Creatinine Clearance: 50.9 mL/min (by C-G formula based on SCr of 0.73 mg/dL). Liver Function  Tests: No results for input(s): AST, ALT, ALKPHOS, BILITOT, PROT, ALBUMIN in the last 168 hours. No results for input(s): LIPASE, AMYLASE in the last 168 hours. No results for input(s): AMMONIA in the last 168 hours. Coagulation Profile: No results for input(s): INR, PROTIME in the last 168 hours. Cardiac Enzymes: No results for input(s): CKTOTAL, CKMB, CKMBINDEX, TROPONINI in the last 168 hours. BNP (last 3 results)  No results for input(s): PROBNP in the last 8760 hours. HbA1C: No results for input(s): HGBA1C in the last 72 hours. CBG: Recent Labs  Lab 08/03/19 1702 08/03/19 2126 08/04/19 0907 08/07/19 1007 08/07/19 1302  GLUCAP 155* 170* 145* 160* 134*   Lipid Profile: No results for input(s): CHOL, HDL, LDLCALC, TRIG, CHOLHDL, LDLDIRECT in the last 72 hours. Thyroid Function Tests: No results for input(s): TSH, T4TOTAL, FREET4, T3FREE, THYROIDAB in the last 72 hours. Anemia Panel: No results for input(s): VITAMINB12, FOLATE, FERRITIN, TIBC, IRON, RETICCTPCT in the last 72 hours. Urine analysis:    Component Value Date/Time   COLORURINE YELLOW 07/21/2019 [place] 07/21/2019 0949   LABSPEC 1.030 07/21/2019 0949   PHURINE 5.0 07/21/2019 0949   GLUCOSEU >=500 (A) 07/21/2019 0949   HGBUR NEGATIVE 07/21/2019 0949   BILIRUBINUR NEGATIVE 07/21/2019 0949   KETONESUR NEGATIVE 07/21/2019 0949   PROTEINUR NEGATIVE 07/21/2019 0949   NITRITE NEGATIVE 07/21/2019 0949   LEUKOCYTESUR NEGATIVE 07/21/2019 0949   Sepsis Labs: Recent Results (from the past 240 hour(s))  Novel Coronavirus, NAA (Hosp order, Send-out to Ref Lab; TAT 18-24 hrs     Status: None   Collection Time: 07/30/19 11:25 AM   Specimen: Nasopharyngeal Swab; Respiratory  Result Value Ref Range Status   SARS-CoV-2, NAA NOT DETECTED NOT DETECTED Final    Comment: (NOTE) This nucleic acid amplification test was developed and its performance characteristics determined by [employer]. Nucleic  acid amplification tests include PCR and TMA. This test has not been FDA cleared or approved. This test has been authorized by FDA under an Emergency Use Authorization (EUA). This test is only authorized for the duration of time the declaration that circumstances exist justifying the authorization of the emergency use of in vitro diagnostic tests for detection of SARS-CoV-2 virus and/or diagnosis of COVID-19 infection under section 564(b)(1) of the Act, 21 U.S.C. 888BVQ-9(I) (1), unless the authorization is terminated or revoked sooner. When diagnostic testing is negative, the possibility of a false negative result should be considered in the context of a patient's recent exposures and the presence of clinical signs and symptoms consistent with COVID-19. An individual without symptoms of COVID- 19 and who is not shedding SARS-CoV-2 vi rus would expect to have a negative (not detected) result in this assay. Performed At: [facility] [address] [place], [place] [id number] Rush Farmer MD KL:[id number]    [place]  Final    Comment: Performed at [facility], [place] [address], [place], [place] [address]  SARS CORONAVIRUS 2 (TAT 6-24 HRS) Nasopharyngeal Nasopharyngeal Swab     Status: None   Collection Time: 08/05/19 11:39 AM   Specimen: Nasopharyngeal Swab  Result Value Ref Range Status   SARS Coronavirus 2 NEGATIVE NEGATIVE Final    Comment: (NOTE) SARS-CoV-2 target nucleic acids are NOT DETECTED. The SARS-CoV-2 RNA is generally detectable in upper and lower respiratory specimens during the acute phase of infection. Negative results do not preclude SARS-CoV-2 infection, do not rule out co-infections with other pathogens, and should not be used as the sole basis for treatment or other patient management decisions. Negative results must be combined with clinical observations, patient history, and epidemiological information. The expected  result is Negative. Fact Sheet for Patients: SugarRoll.be Fact Sheet for Healthcare Providers: https://www.woods-mathews.com/ This test is not yet approved or cleared by the [place] FDA and  has been authorized for detection and/or diagnosis of SARS-CoV-2 by FDA under an Emergency Use Authorization (  EUA). This EUA will remain  in effect (meaning this test can be used) for the duration of the COVID-19 declaration under Section 56 4(b)(1) of the Act, 21 U.S.C. section 360bbb-3(b)(1), unless the authorization is terminated or revoked sooner. Performed at [facility], [place] [address], [place], [place] [address]      Radiological Exams on Admission: No results found.    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 Jacqualyn Posey -Admit to a MedSurg bed -Continue Ancef -Appreciate Podiatry consultative services, will follow-up for further recommending  Peripheral artery disease -Continue Aspirin and plavix  Normocytic anemia: Hemoglobin 10.2 on 11/10 which appears near her baseline which ranges from 9-10. -Check CBC   Essential hypertension: Blood pressures currently controlled. -Continue metoprolol, Cardizem, hydrochlorothiazide, and pharmacy substitution for tekmisartan  Diabetes mellitus type 2: Patient's diabetes appears to be relatively well controlled. Last hemoglobin A1c noted to be 6.7 on 07/21/2019. -Hypoglycemic protocol -Hold glipizide, Metformin,ozempic, and Invokana  -CBGs before every meal with sensitive SSI  Hyperlipidemia -Continue atorvastatin  GERD -Continue protonix  DVT prophylaxis:lovenox   Code Status: Full Family Communication: No family present at bedside Disposition Plan: Likely discharge home in a.m. Consults called: Podiatry Admission status: Observation  Norval Morton MD [facility] Pager [phone]   If 7PM-7AM, please contact  night-coverage [email or web address] Password [email or web address]  08/07/2019, 1:58 PM

## 2019-08-08 ENCOUNTER — Encounter (HOSPITAL_COMMUNITY): Payer: Self-pay | Admitting: Podiatry

## 2019-08-08 DIAGNOSIS — Z20828 Contact with and (suspected) exposure to other viral communicable diseases: Secondary | ICD-10-CM | POA: Diagnosis present

## 2019-08-08 DIAGNOSIS — Z79899 Other long term (current) drug therapy: Secondary | ICD-10-CM | POA: Diagnosis not present

## 2019-08-08 DIAGNOSIS — E1142 Type 2 diabetes mellitus with diabetic polyneuropathy: Secondary | ICD-10-CM | POA: Diagnosis not present

## 2019-08-08 DIAGNOSIS — Z823 Family history of stroke: Secondary | ICD-10-CM | POA: Diagnosis not present

## 2019-08-08 DIAGNOSIS — Z7984 Long term (current) use of oral hypoglycemic drugs: Secondary | ICD-10-CM | POA: Diagnosis not present

## 2019-08-08 DIAGNOSIS — Z7982 Long term (current) use of aspirin: Secondary | ICD-10-CM | POA: Diagnosis not present

## 2019-08-08 DIAGNOSIS — E785 Hyperlipidemia, unspecified: Secondary | ICD-10-CM | POA: Diagnosis present

## 2019-08-08 DIAGNOSIS — Z8349 Family history of other endocrine, nutritional and metabolic diseases: Secondary | ICD-10-CM | POA: Diagnosis not present

## 2019-08-08 DIAGNOSIS — I96 Gangrene, not elsewhere classified: Secondary | ICD-10-CM | POA: Diagnosis not present

## 2019-08-08 DIAGNOSIS — I10 Essential (primary) hypertension: Secondary | ICD-10-CM | POA: Diagnosis present

## 2019-08-08 DIAGNOSIS — K219 Gastro-esophageal reflux disease without esophagitis: Secondary | ICD-10-CM | POA: Diagnosis present

## 2019-08-08 DIAGNOSIS — E1152 Type 2 diabetes mellitus with diabetic peripheral angiopathy with gangrene: Secondary | ICD-10-CM | POA: Diagnosis present

## 2019-08-08 DIAGNOSIS — Z8249 Family history of ischemic heart disease and other diseases of the circulatory system: Secondary | ICD-10-CM | POA: Diagnosis not present

## 2019-08-08 DIAGNOSIS — Z87891 Personal history of nicotine dependence: Secondary | ICD-10-CM | POA: Diagnosis not present

## 2019-08-08 DIAGNOSIS — D649 Anemia, unspecified: Secondary | ICD-10-CM | POA: Diagnosis present

## 2019-08-08 LAB — BASIC METABOLIC PANEL
Anion gap: 12 (ref 5–15)
BUN: 17 mg/dL (ref 8–23)
CO2: 26 mmol/L (ref 22–32)
Calcium: 9.1 mg/dL (ref 8.9–10.3)
Chloride: 100 mmol/L (ref 98–111)
Creatinine, Ser: 0.93 mg/dL (ref 0.44–1.00)
GFR calc Af Amer: >60 mL/min (ref >60)
GFR calc non Af Amer: 59 mL/min — ABNORMAL LOW (ref >60)
Glucose, Bld: 152 mg/dL — ABNORMAL HIGH (ref 70–99)
Potassium: 3.8 mmol/L (ref 3.5–5.1)
Sodium: 138 mmol/L (ref 135–145)

## 2019-08-08 LAB — GLUCOSE, CAPILLARY
Glucose-Capillary: 142 mg/dL — ABNORMAL HIGH (ref 70–99)
Glucose-Capillary: 221 mg/dL — ABNORMAL HIGH (ref 70–99)
Glucose-Capillary: 172 mg/dL — ABNORMAL HIGH (ref 70–99)

## 2019-08-08 MED ORDER — IRBESARTAN 75 MG PO TABS: 75.0000 mg | ORAL_TABLET | Freq: Every day | ORAL | Status: DC

## 2019-08-08 MED ORDER — KETOROLAC TROMETHAMINE 30 MG/ML IJ SOLN: 15.0000 mg | Freq: Three times a day (TID) | INTRAMUSCULAR | Status: AC | PRN

## 2019-08-08 MED ORDER — KETOROLAC TROMETHAMINE 30 MG/ML IJ SOLN
30.0000 mg | Freq: Once | INTRAMUSCULAR | Status: AC
  Administered 2019-08-08: 30 mg via INTRAVENOUS
  Filled 2019-08-08: qty 1

## 2019-08-08 NOTE — Plan of Care (Signed)
  Problem: Clinical Measurements: Goal: Will remain free from infection Outcome: Progressing   Problem: Activity: Goal: Risk for activity intolerance will decrease Outcome: Progressing   Problem: Coping: Goal: Level of anxiety will decrease Outcome: Progressing   Problem: Safety: Goal: Ability to remain free from injury will improve Outcome: Progressing   Problem: Skin Integrity: Goal: Risk for impaired skin integrity will decrease Outcome: Progressing

## 2019-08-08 NOTE — Progress Notes (Signed)
Subjective: POD #1 s/p right foot transmetatarsal amputation.  Overnight she was having quite a bit of pain but upon my evaluation today she states that she is starting to feel better.  She is due for pain medicine at the time I see her in her pain level she reports a 6/10.  She is requesting a walker as well as a bedside commode to go home with.  Otherwise she is doing well and she denies any fevers, chills, nausea, vomiting.  No calf pain, chest pain, shortness of breath.  Objective: AAO x3, NAD Incisional wound VAC in place without any significant drainage in the canister.  I did remove the Ace bandage there is minimal swelling to the foot.  She is describing sharp, nervelike pain to her foot.  Left fourth toe with stable wound with some mild chronic edema but there is no drainage or pus.  No fluctuation or crepitation.  No pain with calf compression, swelling, warmth, erythema  Assessment: POD #1 s/p right transmetatarsal amputation  Plan: -When I came to see her and has been improving as long she keeps on the oxycodone every 6 hours.  I left the wound VAC in place we discussed removing this if this was a source of pain but she does not feel that it is. -Ancef while inpatient -Awaiting PT evaluation.  Partial weightbearing to heel and cam boot.  Will likely need bedside commode and walker to go home with. -Discharge pending pain control, PT evaluation.  Likely discharge tomorrow.  Celesta Gentile, DPM O: [phone] C: [phone]

## 2019-08-08 NOTE — Care Management Obs Status (Signed)
[place] NOTIFICATION   Patient Details  Name: [name] MRN: [id number] Date of Birth: 1941-03-19   Medicare Observation Status Notification Given:  Yes    Claudie Leach, RN 08/08/2019, 4:16 PM

## 2019-08-08 NOTE — Progress Notes (Signed)
PROGRESS NOTE    [name]  VOZ:[id number] DOB: 1941/02/23 DOA: 08/07/2019 PCP: [name], MD  Brief Narrative:[name] is a 78 y.o. female with medical history significant of HTN, HLD, DM type II, and PAD.  She presented for surgery giving gangrene of the right foot.   Previously on 9/21, patient had failed attempt at revascularization of the superficial femoral artery.  She has been referred to Dr.Brabham who performed successful right external iliac, common femoral, profundofemoral, and superficial femoral endarterectomy with patch angioplasty on 10/27.  Then she underwent successful diamondback orbital rotational endarterectomy of high-grade calcified  superficial femoral artery, popliteal, and tibioperoneal stenosis on 11/9 with Dr. Gwenlyn Found.  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  She underwent transmetatarsal amputation Dr. 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 Jacqualyn Posey yesterday -Received perioperative Ancef -Podiatry following, recommending discharge home tomorrow -Pain control, add IV Toradol, - physical therapy eval pending  Peripheral artery disease -Continue Aspirin and plavix  Normocytic anemia -Hemoglobin is stable  Essential hypertension:  -Blood pressures currently controlled. -Continue metoprolol, Cardizem, hold HCTZ and ARB  Diabetes mellitus type 2:  - Last hemoglobin A1c noted to be 6.7 on 07/21/2019. -Hold glipizide, Metformin,ozempic, and Invokana  -stable, continue sliding scale insulin  Hyperlipidemia -Continue atorvastatin  GERD -Continue protonix  DVT prophylaxis:lovenox   Code Status: Full Family Communication: No family present at bedside Disposition Plan:  Home tomorrow Consultants:   Podiatry   Procedures:   Antimicrobials:    Subjective: Had severe pain last night, was not able to get any sleep or rest at all -Extremely  anxious and does not feel she is well enough to go home today  Objective: Vitals:   08/07/19 2120 08/08/19 0320 08/08/19 0737 08/08/19 1310  BP:  (!) 126/46 (!) 128/47 (!) 117/47  Pulse: 78 86 80 81  Resp:  16 18 18   Temp:  98.6 F (37 C) 97.8 F (36.6 C) 98 F (36.7 C)  TempSrc:  Oral Oral Oral  SpO2: 99% 98% 98% 99%    Intake/Output Summary (Last 24 hours) at 08/08/2019 1327 Last data filed at 08/08/2019 1300 Gross per 24 hour  Intake 390 ml  Output 500 ml  Net -110 ml   There were no vitals filed for this visit.  Examination:  General exam: Appears calm and comfortable, uncomfortable appearing Respiratory system: Clear to auscultation. Respiratory effort normal. Cardiovascular system: S1 & S2 heard, RRR. No JVD, murmurs, rubs, gallops Gastrointestinal system: Abdomen is nondistended, soft and nontender.Normal bowel sounds heard. Central nervous system: Alert and oriented. No focal neurological deficits. Extremities right foot transmetatarsal amputation with dressing Skin: No rashes, lesions or ulcers Psychiatry: Judgement and insight appear normal. Mood & affect appropriate.     Data Reviewed:   CBC: Recent Labs  Lab 08/03/19 0936 08/04/19 0335 08/07/19 1547  WBC 16.1* 15.7* 11.5*  HGB 12.0 10.2* 10.4*  HCT 38.1 31.4* 31.9*  MCV 90.9 89.0 89.9  PLT 709* 479* 425*   Basic Metabolic Panel: Recent Labs  Lab 08/03/19 0936 08/04/19 0335 08/08/19 0308  NA 134* 137 138  K 4.2 3.8 3.8  CL 95* 103 100  CO2 25 24 26   GLUCOSE 156* 127* 152*  BUN 22 19 17   CREATININE 1.03* 0.73 0.93  CALCIUM 10.7* 9.3 9.1   GFR: Estimated Creatinine Clearance: 43.7 mL/min (by C-G formula based on SCr of 0.93 mg/dL). Liver  Function Tests: No results for input(s): AST, ALT, ALKPHOS, BILITOT, PROT, ALBUMIN in the last 168 hours. No results for input(s): LIPASE, AMYLASE in the last 168 hours. No results for input(s): AMMONIA in the last 168 hours. Coagulation Profile: No  results for input(s): INR, PROTIME in the last 168 hours. Cardiac Enzymes: No results for input(s): CKTOTAL, CKMB, CKMBINDEX, TROPONINI in the last 168 hours. BNP (last 3 results) No results for input(s): PROBNP in the last 8760 hours. HbA1C: No results for input(s): HGBA1C in the last 72 hours. CBG: Recent Labs  Lab 08/07/19 1302 08/07/19 1822 08/07/19 2102 08/08/19 0637 08/08/19 1143  GLUCAP 134* 178* 163* 142* 221*   Lipid Profile: No results for input(s): CHOL, HDL, LDLCALC, TRIG, CHOLHDL, LDLDIRECT in the last 72 hours. Thyroid Function Tests: No results for input(s): TSH, T4TOTAL, FREET4, T3FREE, THYROIDAB in the last 72 hours. Anemia Panel: No results for input(s): VITAMINB12, FOLATE, FERRITIN, TIBC, IRON, RETICCTPCT in the last 72 hours. Urine analysis:    Component Value Date/Time   COLORURINE YELLOW 07/21/2019 [place] 07/21/2019 0949   LABSPEC 1.030 07/21/2019 0949   PHURINE 5.0 07/21/2019 0949   GLUCOSEU >=500 (A) 07/21/2019 0949   HGBUR NEGATIVE 07/21/2019 0949   BILIRUBINUR NEGATIVE 07/21/2019 0949   KETONESUR NEGATIVE 07/21/2019 0949   PROTEINUR NEGATIVE 07/21/2019 0949   NITRITE NEGATIVE 07/21/2019 0949   LEUKOCYTESUR NEGATIVE 07/21/2019 0949   Sepsis Labs: @LABRCNTIP (procalcitonin:4,lacticidven:4)  ) Recent Results (from the past 240 hour(s))  Novel Coronavirus, NAA (Hosp order, Send-out to Ref Lab; TAT 18-24 hrs     Status: None   Collection Time: 07/30/19 11:25 AM   Specimen: Nasopharyngeal Swab; Respiratory  Result Value Ref Range Status   SARS-CoV-2, NAA NOT DETECTED NOT DETECTED Final    Comment: (NOTE) This nucleic acid amplification test was developed and its performance characteristics determined by [employer]. Nucleic acid amplification tests include PCR and TMA. This test has not been FDA cleared or approved. This test has been authorized by FDA under an Emergency Use Authorization (EUA). This test is only  authorized for the duration of time the declaration that circumstances exist justifying the authorization of the emergency use of in vitro diagnostic tests for detection of SARS-CoV-2 virus and/or diagnosis of COVID-19 infection under section 564(b)(1) of the Act, 21 U.S.C. 657QIO-9(G) (1), unless the authorization is terminated or revoked sooner. When diagnostic testing is negative, the possibility of a false negative result should be considered in the context of a patient's recent exposures and the presence of clinical signs and symptoms consistent with COVID-19. An individual without symptoms of COVID- 19 and who is not shedding SARS-CoV-2 vi rus would expect to have a negative (not detected) result in this assay. Performed At: [facility] [address] [place], [place] [id number] Rush Farmer MD GM:[id number]    Katie  Final    Comment: Performed at [facility], [place] [address], [place], [place] [address]  SARS CORONAVIRUS 2 (TAT 6-24 HRS) Nasopharyngeal Nasopharyngeal Swab     Status: None   Collection Time: 08/05/19 11:39 AM   Specimen: Nasopharyngeal Swab  Result Value Ref Range Status   SARS Coronavirus 2 NEGATIVE NEGATIVE Final    Comment: (NOTE) SARS-CoV-2 target nucleic acids are NOT DETECTED. The SARS-CoV-2 RNA is generally detectable in upper and lower respiratory specimens during the acute phase of infection. Negative results do not preclude SARS-CoV-2 infection, do not rule out co-infections with other pathogens, and should not  be used as the sole basis for treatment or other patient management decisions. Negative results must be combined with clinical observations, patient history, and epidemiological information. The expected result is Negative. Fact Sheet for Patients: SugarRoll.be Fact Sheet for Healthcare Providers: https://www.woods-mathews.com/ This test is not yet  approved or cleared by the [place] FDA and  has been authorized for detection and/or diagnosis of SARS-CoV-2 by FDA under an Emergency Use Authorization (EUA). This EUA will remain  in effect (meaning this test can be used) for the duration of the COVID-19 declaration under Section 56 4(b)(1) of the Act, 21 U.S.C. section 360bbb-3(b)(1), unless the authorization is terminated or revoked sooner. Performed at [facility], [place] [address], [place], [place] [address]          Radiology Studies: No results found.      Scheduled Meds: . aspirin  81 mg Oral QPM  . atorvastatin  20 mg Oral QPM  . Chlorhexidine Gluconate Cloth  6 each Topical Once   And  . Chlorhexidine Gluconate Cloth  6 each Topical Once  . clopidogrel  75 mg Oral Daily  . diltiazem  240 mg Oral Daily  . enoxaparin (LOVENOX) injection  40 mg Subcutaneous Q24H  . insulin aspart  0-9 Units Subcutaneous TID WC  . magnesium oxide  200 mg Oral Q supper  . metoprolol tartrate  100 mg Oral QHS  . metoprolol tartrate  50 mg Oral Daily  . pantoprazole  40 mg Oral Daily   Continuous Infusions: . lactated ringers 10 mL/hr at 08/07/19 1020     LOS: 0 days    Time spent: 2min    Domenic Polite, MD [facility] Page via [email or web address], password [email or web address] After 7PM please contact night-coverage  08/08/2019, 1:27 PM

## 2019-08-08 NOTE — Evaluation (Signed)
Physical Therapy Evaluation Patient Details Name: [name] MRN: [id number] DOB: 1941/05/27 Today's Date: 08/08/2019   History of Present Illness  Patient underwent a transmetatarsal right amputation on 08/07/2019. The patient reported significant  pain overnight but her pain has improved now.   Clinical Impression  Patient tolerated eval well. She was able to stand with supervision. She put limited weight though her heel using her boot. She was able to  Ambulate 6 feet. She has 1 step into her house. She would like to practice a step before discharge tomorrow if able. She had no significant increase in pain with treatment.     Follow Up Recommendations Home health PT    Equipment Recommendations  Rolling walker with 5" wheels    Recommendations for Other Services       Precautions / Restrictions Precautions Precautions: None Restrictions Weight Bearing Restrictions: Yes LLE Weight Bearing: Partial weight bearing LLE Partial Weight Bearing Percentage or Pounds: weight bearing through the heel       Mobility  Bed Mobility Overal bed mobility: Needs Assistance Bed Mobility: Supine to Sit     Supine to sit: Supervision     General bed mobility comments: supervision to the edge of the bed. No increase in pain   Transfers Overall transfer level: Needs assistance Equipment used: Rolling walker (2 wheeled) Transfers: Sit to/from Stand Sit to Stand: Supervision         General transfer comment: supervision for initial standing balance. Cuing for weight bearing   Ambulation/Gait Ambulation/Gait assistance: Supervision Gait Distance (Feet): 6 Feet Assistive device: Rolling walker (2 wheeled) Gait Pattern/deviations: Step-to pattern Gait velocity: decreased   General Gait Details: mod cuing to stand and for weight bearing on the heel. Patient put in a boot.   Stairs            Wheelchair Mobility    Modified Rankin (Stroke Patients Only)        Balance Overall balance assessment: Needs assistance Sitting-balance support: Bilateral upper extremity supported Sitting balance-Leahy Scale: Good     Standing balance support: Bilateral upper extremity supported Standing balance-Leahy Scale: Poor                               Pertinent Vitals/Pain Pain Assessment: Faces Faces Pain Scale: Hurts a little bit Pain Location: right foot  Pain Descriptors / Indicators: Aching Pain Intervention(s): Limited activity within patient's tolerance;Monitored during session;Repositioned    Home Living Family/patient expects to be discharged to:: Private residence Living Arrangements: Children Available Help at Discharge: Family Type of Home: House Home Access: Stairs to enter Entrance Stairs-Rails: Can reach both Entrance Stairs-Number of Steps: 1 Home Layout: One level        Prior Function Level of Independence: Independent         Comments: did not need an AD      Hand Dominance   Dominant Hand: Right    Extremity/Trunk Assessment   Upper Extremity Assessment Upper Extremity Assessment: Overall WFL for tasks assessed    Lower Extremity Assessment Lower Extremity Assessment: RLE deficits/detail RLE: Unable to fully assess due to pain       Communication   Communication: No difficulties  Cognition Arousal/Alertness: Awake/alert Behavior During Therapy: WFL for tasks assessed/performed Overall Cognitive Status: Within Functional Limits for tasks assessed  General Comments General comments (skin integrity, edema, etc.): provena in place. Therapy showed patient how to don/doff boot    Exercises     Assessment/Plan    PT Assessment Patient needs continued PT services  PT Problem List         PT Treatment Interventions DME instruction;Stair training;Functional mobility training;Therapeutic activities;Therapeutic exercise;Patient/family  education;Gait training    PT Goals (Current goals can be found in the Care Plan section)  Acute Rehab PT Goals Patient Stated Goal: to go home  PT Goal Formulation: With patient Time For Goal Achievement: 08/15/19 Potential to Achieve Goals: Good    Frequency Min 3X/week   Barriers to discharge        Co-evaluation               AM-PAC PT "6 Clicks" Mobility  Outcome Measure Help needed turning from your back to your side while in a flat bed without using bedrails?: A Little Help needed moving from lying on your back to sitting on the side of a flat bed without using bedrails?: A Little Help needed moving to and from a bed to a chair (including a wheelchair)?: A Little Help needed standing up from a chair using your arms (e.g., wheelchair or bedside chair)?: A Little Help needed to walk in hospital room?: A Little Help needed climbing 3-5 steps with a railing? : A Lot 6 Click Score: 17    End of Session Equipment Utilized During Treatment: Gait belt Activity Tolerance: Patient tolerated treatment well Patient left: in bed Nurse Communication: Mobility status PT Visit Diagnosis: Unsteadiness on feet (R26.81);Other abnormalities of gait and mobility (R26.89);Muscle weakness (generalized) (M62.81);Pain Pain - Right/Left: Right Pain - part of body: Ankle and joints of foot    Time: 1235-1300 PT Time Calculation (min) (ACUTE ONLY): 25 min   Charges:   PT Evaluation $PT Eval Moderate Complexity: 1 Mod           Carney Living PT DPT  08/08/2019, 2:24 PM

## 2019-08-08 NOTE — Plan of Care (Addendum)
Pt complaining of pain & tearful throughout shift. All PRNs given, ice packs applied & foot of bed elevated. See MAR for details.   Toradol was the only PRN that pt stated provided relief.   Problem: Education: Goal: Knowledge of General Education information will improve Description: Including pain rating scale, medication(s)/side effects and non-pharmacologic comfort measures Outcome: Progressing   Problem: Activity: Goal: Risk for activity intolerance will decrease Outcome: Progressing   Problem: Coping: Goal: Level of anxiety will decrease Outcome: Progressing   Problem: Elimination: Goal: Will not experience complications related to urinary retention Outcome: Progressing   Problem: Safety: Goal: Ability to remain free from injury will improve Outcome: Progressing

## 2019-08-09 LAB — GLUCOSE, CAPILLARY
Glucose-Capillary: 225 mg/dL — ABNORMAL HIGH (ref 70–99)
Glucose-Capillary: 180 mg/dL — ABNORMAL HIGH (ref 70–99)

## 2019-08-09 NOTE — Progress Notes (Signed)
Subjective: POD #2 s/p right foot transmetatarsal amputation.  She reports that her pain is much better controlled.  She did work with physical therapy yesterday and she feels comfortable going home but requesting a bedside commode as well as a walker.  She states that the oxycodone is controlling her pain every 6 hours.  She denies any fevers, chills, nausea, vomiting.  No calf pain, chest pain, shortness of breath.  Objective: AAO x3, NAD Incisional wound VAC in place without any significant drainage in the canister.  There is no significant swelling to the foot there is no erythema or warmth to the foot that I can identify today. Left 4th toe is stable.   No pain with calf compression, swelling, warmth, erythema  Assessment: POD #2 s/p right transmetatarsal amputation  Plan: -Overall her pain is much better controlled.  She still needing the oxycodone every 6 hours but she states that she is comfortable with this pain regimen. -Continue partial weightbearing to heal in cam boot.  Follow-up with PT.  Needs walker as well as bedside commode for home. -Continue oxycodone for pain. -Continue aspirin, Plavix -From my standpoint her pain is controlled and she wanted to discharge home today pending home health needs.  She does not need home health care at this time.  She has a follow-up scheduled for Thursday to see me to remove the wound VAC.   Celesta Gentile, DPM O: [phone] C: [phone]

## 2019-08-09 NOTE — TOC Transition Note (Signed)
Transition of Care [facility]) - CM/SW Discharge Note   Patient Details  Name: [name] MRN: [id number] Date of Birth: Sep 01, 1941  [facility] CM/SW Contact:  Claudie Leach, RN Phone Number: [phone] 08/09/2019, 2:38 PM   Clinical Narrative:    Patient discharged home with home health PT , RW, and 3n1.     Final next level of care: Home w Home Health Services Barriers to Discharge: No Barriers Identified   Patient Goals and CMS Choice   CMS Medicare.gov Compare Post Acute Care list provided to:: Patient Choice offered to / list presented to : Patient   Discharge Plan and Services                DME Arranged: Walker rolling, 3-N-1 DME Agency: [employer] Date DME Agency Contacted: 08/08/19 Time DME Agency Contacted: [phone] Representative spoke with at DME Agency: [place]     Date [place]: 08/09/19 Time [place]: 1258 Representative spoke with at [place]: Floydene Flock   Readmission Risk Interventions Readmission Risk Prevention Plan 07/22/2019  Post Dischage Appt Complete  Medication Screening Complete  Transportation Screening Complete  Some recent data might be hidden

## 2019-08-09 NOTE — Discharge Summary (Signed)
Physician Discharge Summary  [name]:[id number] DOB: April 03, 1941 DOA: 08/07/2019  PCP: [name], MD  Admit date: 08/07/2019 Discharge date: 08/09/2019  Time spent: 35 minutes  Recommendations for Outpatient Follow-up:  1. Vascular Dr. Trula Slade, patient has an upcoming appointment this week 2. Podiatry Dr. 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 She presented for surgery giving gangrene of the right foot. Previously on 9/21,patient had failed attempt at revascularization of the superficial femoral artery. She has been referred to Dr.Brabham whoperformed successful right external iliac, common femoral, profundofemoral, andsuperficial femoral endarterectomy with patch angioplasty on 10/27. Then she underwent successful diamondback orbital rotational endarterectomy of high-grade calcified superficial femoral artery, popliteal, and tibioperoneal stenosis on 11/9 with Dr.Berry.Patient dry gangrene of her right foot. She underwent transmetatarsal amputation Dr. Jacqualyn Posey and admitted postop  Hospital Course:   Gangrene of the right foots/ptransmetatarsal amputation:  -by Dr. Jacqualyn Posey  11/13 -Received perioperative Ancef -Podiatry following -Controlled now, ambulated with physical therapy yesterday and today -She will be discharged home in a stable condition to follow-up with vascular surgery Dr. Trula Slade she has an upcoming appointment next week  and podiatry Dr. Earleen [place] in 3 days  Peripheral artery disease -Continue Aspirin and plavix  Normocytic anemia -Hemoglobin is stable  Essential hypertension:  -Blood pressures currently controlled. -Continue metoprolol, Cardizem, hold HCTZ and ARB  Diabetes mellitus type 2:  - Last hemoglobin A1c noted to be 6.7 on 07/21/2019. -Resume glipizide, Metformin,ozempic, and Invokana   Hyperlipidemia -Continue atorvastatin  GERD -Continue protonix  Discharge Exam: Vitals:   08/08/19 2015 08/09/19 0545  BP: (!) 122/52 (!) 119/46  Pulse: 92 100  Resp: 16 12  Temp: 98.4 F (36.9 C) 99 F (37.2 C)  SpO2: 99% 97%    General: AAOx3, no distress Cardiovascular: S1-S2/regular rate rhythm Respiratory: Clear  Discharge Instructions   Discharge Instructions    Diet - low sodium heart healthy   Complete by: As directed    Diet Carb Modified   Complete by: As directed    Increase activity slowly   Complete by: As directed      Allergies as of 08/09/2019      Reactions   Bactrim [sulfamethoxazole-trimethoprim] Other (See Comments)   ^ K+( elevated)    Demerol [meperidine]    Delusional    Scopolamine    Delusional    Versed [midazolam] Anxiety   Frantic, out of my mind, agitated       Medication List    TAKE these medications   acetaminophen 500 MG tablet Commonly known as: TYLENOL Take 1,000 mg by mouth every 6 (six) hours as needed for moderate pain or headache.   aspirin 81 MG tablet Take 81 mg by mouth every evening.   atorvastatin 20 MG tablet Commonly known as: LIPITOR Take 1 tablet by mouth once daily What changed: when to take this   calcium carbonate 500 MG chewable tablet Commonly known as: TUMS - dosed in mg  elemental calcium Chew 2 tablets by mouth daily as needed for indigestion or heartburn.   CINNAMON PO Take 1,000 mg by mouth 3 (three) times daily with meals.   clopidogrel 75 MG tablet Commonly known as: PLAVIX Take 1 tablet (75 mg  total) by mouth daily.   diltiazem 240 MG 24 hr capsule Commonly known as: Cartia XT Take 1 capsule (240 mg total) by mouth daily.   fish oil-omega-3 fatty acids 1000 MG capsule Take 1 g by mouth 4 (four) times a week. Sun, Mon, Wed, Fri at night   glipiZIDE 5 MG 24 hr tablet Commonly known as: GLUCOTROL XL Take 1 tablet by mouth once daily with breakfast   hydrochlorothiazide 25 MG tablet Commonly known as: HYDRODIURIL Take 1 tablet (25 mg total) by mouth daily.   Invokana 100 MG Tabs tablet Generic drug: canagliflozin TAKE 1 TABLET BY MOUTH BEFORE BREAKFAST   Magnesium 250 MG Tabs Take 250 mg by mouth daily with supper.   metFORMIN 1000 MG tablet Commonly known as: GLUCOPHAGE TAKE 1 TABLET BY MOUTH TWICE DAILY WITH A MEAL What changed: See the new instructions.   metoprolol tartrate 100 MG tablet Commonly known as: LOPRESSOR Take half tab in the morning and a whole tab in the evening. What changed:   how much to take  how to take this  when to take this  additional instructions   nitroGLYCERIN 0.2 mg/hr patch Commonly known as: Nitro-Dur Place 1 patch (0.2 mg total) onto the skin daily. Apply behind right toe daily   oxyCODONE 5 MG immediate release tablet Commonly known as: Oxy IR/ROXICODONE Take 1 tablet (5 mg total) by mouth every 6 (six) hours as needed for moderate pain.   Ozempic (0.25 or 0.5 MG/DOSE) 2 MG/1.5ML Sopn Generic drug: Semaglutide(0.25 or 0.5MG /DOS) INJECT 0.5MG  INTO THE SKIN ONCE A WEEK What changed: See the new instructions.   pantoprazole 40 MG tablet Commonly known as: PROTONIX Take 1 tablet (40 mg total) by mouth daily.   telmisartan 80 MG tablet Commonly known as: MICARDIS Take 1 tablet (80 mg total) by mouth daily. What changed:   how much to take  when to take this   vitamin B-12 1000 MCG tablet Commonly known as: CYANOCOBALAMIN Take 1,000 mcg by mouth daily with supper.   vitamin C 1000 MG tablet Take 1,000 mg by  mouth daily with supper.            Durable Medical Equipment  (From admission, onward)         Start     Ordered   08/08/19 1622  For home use only DME 3 n 1  Once     08/08/19 1623   08/08/19 1618  For home use only DME Walker rolling  Once    Question:  Patient needs a walker to treat with the following condition  Answer:  Partial nontraumatic amputation of foot ([place])   08/08/19 1623         Allergies  Allergen Reactions  . Bactrim [Sulfamethoxazole-Trimethoprim] Other (See Comments)    ^ K+( elevated)   . Demerol [Meperidine]     Delusional   . Scopolamine     Delusional   . Versed [Midazolam] Anxiety    Frantic, out of my mind, agitated    Follow-up Information    [name], MD. Go in 1 week(s).   Specialties: Vascular Surgery, Cardiology Contact information: [address] [place]  [address] [phone]  Trula Slade, DPM. Schedule an appointment as soon as possible for a visit in 1 week(s).   Specialty: Podiatry Contact information: [place] [phone]        [facility] Follow up.   Specialty: Home Health Services Why: [phone].  Physical therapist will contact you Monday or Tuesday..            The results of significant diagnostics from this hospitalization (including imaging, microbiology, ancillary and laboratory) are listed below for reference.    Significant Diagnostic Studies: No results found.  Microbiology: Recent Results (from the past 240 hour(s))  SARS CORONAVIRUS 2 (TAT 6-24 HRS) Nasopharyngeal Nasopharyngeal Swab     Status: None   Collection Time: 08/05/19 11:39 AM   Specimen: Nasopharyngeal Swab  Result Value Ref Range Status   SARS Coronavirus 2 NEGATIVE NEGATIVE Final    Comment: (NOTE) SARS-CoV-2 target nucleic acids are NOT DETECTED. The SARS-CoV-2 RNA is generally detectable in upper and lower respiratory specimens during  the acute phase of infection. Negative results do not preclude SARS-CoV-2 infection, do not rule out co-infections with other pathogens, and should not be used as the sole basis for treatment or other patient management decisions. Negative results must be combined with clinical observations, patient history, and epidemiological information. The expected result is Negative. Fact Sheet for Patients: SugarRoll.be Fact Sheet for Healthcare Providers: https://www.woods-mathews.com/ This test is not yet approved or cleared by the [place] FDA and  has been authorized for detection and/or diagnosis of SARS-CoV-2 by FDA under an Emergency Use Authorization (EUA). This EUA will remain  in effect (meaning this test can be used) for the duration of the COVID-19 declaration under Section 56 4(b)(1) of the Act, 21 U.S.C. section 360bbb-3(b)(1), unless the authorization is terminated or revoked sooner. Performed at [facility], [place] [address] [place], [place] [address]      Labs: Basic Metabolic Panel: Recent Labs  Lab 08/03/19 0936 08/04/19 0335 08/08/19 0308  NA 134* 137 138  K 4.2 3.8 3.8  CL 95* 103 100  CO2 25 24 26   GLUCOSE 156* 127* 152*  BUN 22 19 17   CREATININE 1.03* 0.73 0.93  CALCIUM 10.7* 9.3 9.1   Liver Function Tests: No results for input(s): AST, ALT, ALKPHOS, BILITOT, PROT, ALBUMIN in the last 168 hours. No results for input(s): LIPASE, AMYLASE in the last 168 hours. No results for input(s): AMMONIA in the last 168 hours. CBC: Recent Labs  Lab 08/03/19 0936 08/04/19 0335 08/07/19 1547  WBC 16.1* 15.7* 11.5*  HGB 12.0 10.2* 10.4*  HCT 38.1 31.4* 31.9*  MCV 90.9 89.0 89.9  PLT 709* 479* 502*   Cardiac Enzymes: No results for input(s): CKTOTAL, CKMB, CKMBINDEX, TROPONINI in the last 168 hours. BNP: BNP (last 3 results) No results for input(s): BNP in the last 8760 hours.  ProBNP (last 3 results) No  results for input(s): PROBNP in the last 8760 hours.  CBG: Recent Labs  Lab 08/08/19 0637 08/08/19 1143 08/08/19 1618 08/09/19 0656 08/09/19 1212  GLUCAP 142* 221* 172* 225* 180*       Signed:  Domenic Polite MD.  [facility] 08/09/2019, 1:57 PM

## 2019-08-11 ENCOUNTER — Telehealth: Payer: Self-pay | Admitting: *Deleted

## 2019-08-11 LAB — GLUCOSE, CAPILLARY: Glucose-Capillary: 155 mg/dL — ABNORMAL HIGH (ref 70–99)

## 2019-08-11 NOTE — Telephone Encounter (Signed)
[place], PT states pt has requested to delay PT until Monday or Tuesday of next week, pt states she has so many doctor appts this week.

## 2019-08-12 ENCOUNTER — Other Ambulatory Visit: Payer: Self-pay

## 2019-08-12 ENCOUNTER — Ambulatory Visit (INDEPENDENT_AMBULATORY_CARE_PROVIDER_SITE_OTHER): Payer: Self-pay | Admitting: Physician Assistant

## 2019-08-12 VITALS — BP 107/58 | HR 88 | Temp 97.9°F | Resp 16 | Ht 64.0 in | Wt 141.0 lb

## 2019-08-12 DIAGNOSIS — I70235 Atherosclerosis of native arteries of right leg with ulceration of other part of foot: Secondary | ICD-10-CM

## 2019-08-12 NOTE — Telephone Encounter (Signed)
That is fine. Thanks for the info.

## 2019-08-12 NOTE — Progress Notes (Signed)
POST OPERATIVE OFFICE NOTE    CC:  F/u for surgery  HPI:  This is a 78 y.o. female who is s/p   #1: Right external iliac, common femoral, profundofemoral, and superficial femoral endarterectomy with bovine pericardial patch angioplasty #2: Praveena wound VAC  This was followed by TMA by Dr. Jacqualyn Posey DPM.  She is followed by Dr. Gwenlyn Found who requested our assistance after a recent angiogram which revealed  severe disease in the right common femoral and profundofemoral artery as well as the superficial femoral artery.    08/03/19 Dr. Gwenlyn Found performed an angiograms of the right LE and atherectomy of the mid and distal SFA right popliteal stenosis and tibioperoneal stenosis followed by [facility] using distal protection in the peroneal artery.    She states she has pain at night when she is resting and has it elevated.  She also has minimal incision separation right groin. without pain or drainage.    Allergies  Allergen Reactions  . Bactrim [Sulfamethoxazole-Trimethoprim] Other (See Comments)    ^ K+( elevated)   . Demerol [Meperidine]     Delusional   . Scopolamine     Delusional   . Versed [Midazolam] Anxiety    Frantic, out of my mind, agitated     Current Outpatient Medications  Medication Sig Dispense Refill  . acetaminophen (TYLENOL) 500 MG tablet Take 1,000 mg by mouth every 6 (six) hours as needed for moderate pain or headache.    . Ascorbic Acid (VITAMIN C) 1000 MG tablet Take 1,000 mg by mouth daily with supper.     Marland Kitchen aspirin 81 MG tablet Take 81 mg by mouth every evening.     Marland Kitchen atorvastatin (LIPITOR) 20 MG tablet Take 1 tablet by mouth once daily (Patient taking differently: Take 20 mg by mouth every evening. ) 90 tablet 0  . calcium carbonate (TUMS - DOSED IN MG ELEMENTAL CALCIUM) 500 MG chewable tablet Chew 2 tablets by mouth daily as needed for indigestion or heartburn.    Marland Kitchen CINNAMON PO Take 1,000 mg by mouth 3 (three) times daily with meals.     . clopidogrel (PLAVIX) 75 MG tablet  Take 1 tablet (75 mg total) by mouth daily. 30 tablet 1  . diltiazem (CARTIA XT) 240 MG 24 hr capsule Take 1 capsule (240 mg total) by mouth daily. 90 capsule 3  . fish oil-omega-3 fatty acids 1000 MG capsule Take 1 g by mouth 4 (four) times a week. Sun, Mon, Wed, Fri at night    . glipiZIDE (GLUCOTROL XL) 5 MG 24 hr tablet Take 1 tablet by mouth once daily with breakfast 90 tablet 0  . hydrochlorothiazide (HYDRODIURIL) 25 MG tablet Take 1 tablet (25 mg total) by mouth daily. 90 tablet 1  . INVOKANA 100 MG TABS tablet TAKE 1 TABLET BY MOUTH BEFORE BREAKFAST 30 tablet 0  . Magnesium 250 MG TABS Take 250 mg by mouth daily with supper.    . metFORMIN (GLUCOPHAGE) 1000 MG tablet TAKE 1 TABLET BY MOUTH TWICE DAILY WITH A MEAL (Patient taking differently: Take 1,000 mg by mouth 2 (two) times daily. ) 180 tablet 0  . metoprolol (LOPRESSOR) 100 MG tablet Take half tab in the morning and a whole tab in the evening. (Patient taking differently: Take 50-100 mg by mouth See admin instructions. Take 50 mg in the morning and 100 mg in the evening) 45 tablet 3  . nitroGLYCERIN (NITRO-DUR) 0.2 mg/hr patch Place 1 patch (0.2 mg total) onto the skin daily.  Apply behind right toe daily 30 patch 12  . oxyCODONE (OXY IR/ROXICODONE) 5 MG immediate release tablet Take 1 tablet (5 mg total) by mouth every 6 (six) hours as needed for moderate pain. 20 tablet 0  . OZEMPIC, 0.25 OR 0.5 MG/DOSE, 2 MG/1.5ML SOPN INJECT 0.5MG  INTO THE SKIN ONCE A WEEK (Patient taking differently: Uses this med. q Sunday) 2 mL 0  . pantoprazole (PROTONIX) 40 MG tablet Take 1 tablet (40 mg total) by mouth daily. 90 tablet 3  . telmisartan (MICARDIS) 80 MG tablet Take 1 tablet (80 mg total) by mouth daily. (Patient taking differently: Take 40 mg by mouth every evening. ) 90 tablet 3  . vitamin B-12 (CYANOCOBALAMIN) 1000 MCG tablet Take 1,000 mcg by mouth daily with supper.     Current Facility-Administered Medications  Medication Dose Route  Frequency Provider Last Rate Last Dose  . sodium chloride flush (NS) 0.9 % injection 3 mL  3 mL Intravenous Q12H [name], MD         ROS:  See HPI  Physical Exam:    Incision:  Right groin without drainage, erythema or edema.  Soft to touch.  Minimal superior incision superficial incisional separation.   Extremities:  Doppler signals peroneal, PT/DP right LE intact.  TMA covered with wound vac with good suction seal.     Assessment/Plan:  This is a 78 y.o. female who is s/p: #1: Right external iliac, common femoral, profundofemoral, and superficial femoral endarterectomy with bovine pericardial patch angioplasty #2: Praveena wound VAC                          S/P angiogram with arthrectomy by DR. Berry after our femoral endarterectomy showing patent arterial flow to the right foot.  Intact doppler signals.  I have advised her to continue with soap and water washing of the right groin daily and dry guaze over the right groin incision daily until healed.    She will f/u with Dr. Gwenlyn Found for ABI' and arterial duplex surveillance studies.  F/U with Dr. Jacqualyn Posey for TMA and f/U with Dr. Trula Slade as needed.   Her pain may react to a different narcotic.  I suggested maybe Hydrocodone.  She states she will discuss this with Dr. Jacqualyn Posey tomorrow when she sees him.       Roxy Horseman, PA-C [facility] [phone]  Clinic MD:  Oneida Alar

## 2019-08-13 ENCOUNTER — Ambulatory Visit (INDEPENDENT_AMBULATORY_CARE_PROVIDER_SITE_OTHER): Payer: Medicare Other | Admitting: Podiatry

## 2019-08-13 ENCOUNTER — Telehealth: Payer: Self-pay | Admitting: *Deleted

## 2019-08-13 DIAGNOSIS — I96 Gangrene, not elsewhere classified: Secondary | ICD-10-CM

## 2019-08-13 DIAGNOSIS — I739 Peripheral vascular disease, unspecified: Secondary | ICD-10-CM

## 2019-08-13 DIAGNOSIS — S98911A Complete traumatic amputation of right foot, level unspecified, initial encounter: Secondary | ICD-10-CM

## 2019-08-13 NOTE — Telephone Encounter (Signed)
-----   Message from Trula Slade, DPM sent at 08/13/2019 10:02 AM EST ----- Can we try to arrange home health to come out for dressing changes? I will put the orders in my note. Thanks.

## 2019-08-14 ENCOUNTER — Other Ambulatory Visit: Payer: Self-pay

## 2019-08-14 ENCOUNTER — Ambulatory Visit (HOSPITAL_COMMUNITY)
Admission: RE | Admit: 2019-08-14 | Discharge: 2019-08-14 | Disposition: A | Discharge status: Home / Self Care | Payer: Medicare Other | Source: Ambulatory Visit | Attending: Cardiology | Admitting: Cardiology

## 2019-08-14 DIAGNOSIS — I6523 Occlusion and stenosis of bilateral carotid arteries: Secondary | ICD-10-CM | POA: Diagnosis not present

## 2019-08-14 DIAGNOSIS — I10 Essential (primary) hypertension: Secondary | ICD-10-CM

## 2019-08-14 DIAGNOSIS — I739 Peripheral vascular disease, unspecified: Secondary | ICD-10-CM | POA: Diagnosis not present

## 2019-08-14 NOTE — Telephone Encounter (Signed)
Required form, clinicals and demographics to [facility].

## 2019-08-14 NOTE — Telephone Encounter (Signed)
Note is complete. I believe she has [facility] coming for PT. Not sure if they can do [name]

## 2019-08-14 NOTE — Progress Notes (Signed)
Subjective: [name] is a 78 y.o. is seen today in office s/p right TMA preformed on 08/07/2019.  Her pain is controlled and she is taking pain medicine only at nighttime and Tylenol during the day.  I discussed changing her pain medicine today but she is comfortable with that she is doing.  Denies any systemic complaints such as fevers, chills, nausea, vomiting. No calf pain, chest pain, shortness of breath.   Objective: General: No acute distress, AAOx3  Neurovascular status unchanged RIGHT foot: Incisional wound VAC in place.  Upon removal incision is well coapted without any evidence of dehiscence.  There are some hyperpigmented changes to the medial aspect the incision.  Some mild swelling but there is no significant erythema or warmth.  No fluctuation or crepitation.  No drainage or pus.  No malodor. The wound on the left fourth toe is stable.  No drainage or pus.  Minimal swelling. No pain with calf compression, swelling, warmth, erythema.       Assessment and Plan:  Status post right transmetatarsal amputation, doing well with no complications   -Treatment options discussed including all alternatives, risks, and complications -Wound VAC removed.  Incision coapted.  Small meta Betadine paint over the incision followed by Adaptic and a dry sterile dressing. -Continue a cam boot and limit activity.  Weightbearing to the heel.  Encouraged elevation. -Pain medication as needed-offered to change medication however she wants to keep the current regimen. -We will try to arrange home health care.  On the right side pain to small amount of Betadine on the incision followed by nonstick dressing, 4 x 4's, Kerlix, Ace bandage.  On the left side Betadine to the wound followed by sterile dressing. -The left fourth toe is stable at this time. -Monitor for any clinical signs or symptoms of infection and DVT/PE and directed to call the office immediately should any occur or go to the ER.  -Follow-up next week for wound check or sooner if any problems arise. In the meantime, encouraged to call the office with any questions, concerns, change in symptoms.   Celesta Gentile, DPM

## 2019-08-17 ENCOUNTER — Ambulatory Visit: Payer: Medicare Other | Admitting: Cardiovascular Disease

## 2019-08-18 ENCOUNTER — Ambulatory Visit (INDEPENDENT_AMBULATORY_CARE_PROVIDER_SITE_OTHER): Payer: Medicare Other | Admitting: Cardiovascular Disease

## 2019-08-18 ENCOUNTER — Other Ambulatory Visit: Payer: Self-pay

## 2019-08-18 ENCOUNTER — Encounter: Payer: Self-pay | Admitting: Cardiovascular Disease

## 2019-08-18 ENCOUNTER — Telehealth: Payer: Self-pay | Admitting: *Deleted

## 2019-08-18 ENCOUNTER — Ambulatory Visit (INDEPENDENT_AMBULATORY_CARE_PROVIDER_SITE_OTHER): Payer: Medicare Other | Admitting: Podiatry

## 2019-08-18 VITALS — BP 115/58 | HR 89 | Temp 96.9°F | Ht 64.5 in | Wt 130.4 lb

## 2019-08-18 DIAGNOSIS — I10 Essential (primary) hypertension: Secondary | ICD-10-CM | POA: Diagnosis not present

## 2019-08-18 DIAGNOSIS — Z7902 Long term (current) use of antithrombotics/antiplatelets: Secondary | ICD-10-CM | POA: Diagnosis not present

## 2019-08-18 DIAGNOSIS — D649 Anemia, unspecified: Secondary | ICD-10-CM | POA: Diagnosis not present

## 2019-08-18 DIAGNOSIS — I998 Other disorder of circulatory system: Secondary | ICD-10-CM | POA: Diagnosis not present

## 2019-08-18 DIAGNOSIS — Z79899 Other long term (current) drug therapy: Secondary | ICD-10-CM | POA: Diagnosis not present

## 2019-08-18 DIAGNOSIS — I739 Peripheral vascular disease, unspecified: Secondary | ICD-10-CM

## 2019-08-18 DIAGNOSIS — Z4781 Encounter for orthopedic aftercare following surgical amputation: Secondary | ICD-10-CM | POA: Diagnosis not present

## 2019-08-18 DIAGNOSIS — I70229 Atherosclerosis of native arteries of extremities with rest pain, unspecified extremity: Secondary | ICD-10-CM

## 2019-08-18 DIAGNOSIS — M1990 Unspecified osteoarthritis, unspecified site: Secondary | ICD-10-CM | POA: Diagnosis not present

## 2019-08-18 DIAGNOSIS — I6523 Occlusion and stenosis of bilateral carotid arteries: Secondary | ICD-10-CM

## 2019-08-18 DIAGNOSIS — Z7982 Long term (current) use of aspirin: Secondary | ICD-10-CM | POA: Diagnosis not present

## 2019-08-18 DIAGNOSIS — E785 Hyperlipidemia, unspecified: Secondary | ICD-10-CM | POA: Diagnosis not present

## 2019-08-18 DIAGNOSIS — Z85828 Personal history of other malignant neoplasm of skin: Secondary | ICD-10-CM | POA: Diagnosis not present

## 2019-08-18 DIAGNOSIS — Z89431 Acquired absence of right foot: Secondary | ICD-10-CM | POA: Diagnosis not present

## 2019-08-18 DIAGNOSIS — S98911A Complete traumatic amputation of right foot, level unspecified, initial encounter: Secondary | ICD-10-CM

## 2019-08-18 DIAGNOSIS — E1152 Type 2 diabetes mellitus with diabetic peripheral angiopathy with gangrene: Secondary | ICD-10-CM | POA: Diagnosis not present

## 2019-08-18 DIAGNOSIS — I96 Gangrene, not elsewhere classified: Secondary | ICD-10-CM

## 2019-08-18 DIAGNOSIS — K219 Gastro-esophageal reflux disease without esophagitis: Secondary | ICD-10-CM | POA: Diagnosis not present

## 2019-08-18 DIAGNOSIS — Z7984 Long term (current) use of oral hypoglycemic drugs: Secondary | ICD-10-CM | POA: Diagnosis not present

## 2019-08-18 MED ORDER — DOXYCYCLINE HYCLATE 100 MG PO TABS: 100.0000 mg | ORAL_TABLET | Freq: Two times a day (BID) | ORAL | 0 refills | Status: DC

## 2019-08-18 NOTE — Telephone Encounter (Signed)
-----   Message from Trula Slade, DPM sent at 08/18/2019  8:44 AM EST ----- Just wanted to follow up to see if we heard anything from home health? Thanks.

## 2019-08-18 NOTE — Telephone Encounter (Signed)
Great. She just came in today and wanted to follow up. Thanks.

## 2019-08-18 NOTE — Addendum Note (Signed)
Addended by: Cain Sieve on: 08/18/2019 01:55 PM   Modules accepted: Orders

## 2019-08-18 NOTE — Telephone Encounter (Signed)
[facility] states PT will go out to see pt today and process for initial visit, and transferred to Case Manager - Berniece Salines. I informed Berniece Salines of Dr. Leigh Aurora orders of 08/17/2019, she states with betadine to sites 3 times a week they will teach pt or caregiver.

## 2019-08-18 NOTE — Progress Notes (Signed)
08/18/2019 [name]   April 30, 1941  [id number]  Primary Physician [name], MD Primary Cardiologist: [name] MD [name], [place]  HPI:  [name] is a 78 y.o.  mildly overweight widowed Caucasian female mother of 41, grandmother 7 grandchildren referred by Dr. Jacqualyn Posey, her podiatrist for critical limb ischemia.I last saw her in the office  07/28/2019. She does have a history of treated hypertension, diabetes and hyperlipidemia. She is never had a heart attack or stroke. She denies chest pain or shortness of breath. 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 She has some local trauma related to socks over Christmas and has developed ischemic appearing wounds on her right second and third toes with recent Dopplers performed 10/09/2018 revealing a right ABI 0.75, left 2.79 with moderately severe right SFA stenosis and an occluded right posterior tibial artery. She is seeing Dr. Jacqualyn Posey back regularly for aggressive local wound care.  Herwounds continue to heal on her right and left second toes. These sores are probably a result of local trauma. Recent ABIs are in the0.6range with a high-grade right SFA stenosis and an occluded left popliteal with tibial vessel disease as well. She does complain of some claudication.She wishes to proceed with endovascular therapy for wound healing and lifestyle limiting claudication.  I performed angiography on her 06/15/2019 with a failed attempt at crossing the left SFA CTO.  She has one-vessel runoff bilaterally.  She did develop gangrenous right first and second toes.  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 Trula Slade who performed right common femoral endarterectomy with patch angioplasty 07/21/2019 with excellent result.  She did see Dr. Jacqualyn Posey back in the office  late last week who thought her wounds were stable.  The pain in her feet has improved since her endarterectomy.  The plan is to perform orbital atherectomy and PTA of her calcified right SFA as well as intervention on her right tibioperoneal trunk prior to right TMA by Dr. Earleen Newport for limb salvage.  I performed orbital atherectomy followed by drug-coated balloon angioplasty of her right SFA, popliteal and tibioperoneal trunk on 08/03/2019 with excellent result.  Her follow-up Dopplers performed 08/14/2019 revealed a widely patent SFA and tibioperoneal trunk.  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 Jacqualyn Posey on 08/07/2019 which is slowly healing.  He did did see her today in the office and began her on oral antibiotic for what sounds likes presumed cellulitis.   No outpatient medications have been marked as taking for the 08/18/19 encounter (Office Visit) with [name], MD.   Current Facility-Administered Medications for the 08/18/19 encounter (Office Visit) with [name], MD  Medication  . sodium chloride flush (NS) 0.9 % injection 3 mL     Allergies  Allergen Reactions  . Bactrim [Sulfamethoxazole-Trimethoprim] Other (See Comments)    ^ K+( elevated)   . Demerol [Meperidine]     Delusional   . Scopolamine     Delusional   . Versed [Midazolam] Anxiety    Frantic, out of my mind, agitated     Social History   Socioeconomic History  . Marital status: Widowed    Spouse name: Not on file  . Number of children: Not on file  . Years of education: Not on file  . Highest education level: Not on file  Occupational History  . Not on file  Social Needs  . Financial resource strain: Not on file  . Food insecurity    Worry: Not on file    Inability: Not on file  . Transportation needs    Medical: Not on file    Non-medical: Not on file  Tobacco Use  . Smoking status: Former Smoker    Quit date: 03/31/1972    Years since quitting: 47.4  . Smokeless tobacco: Never  Used  Substance and Sexual Activity  . Alcohol use: No  . Drug use: No  . Sexual activity: Not on file  Lifestyle  . Physical activity    Days per week: Not on file    Minutes per session: Not on file  . Stress: Not on file  Relationships  . Social Herbalist on phone: Not on file    Gets together: Not on file    Attends religious service: Not on file    Active member of club or organization: Not on file    Attends meetings of clubs or organizations: Not on file    Relationship status: Not on file  . Intimate partner violence    Fear of current or ex partner: Not on file    Emotionally abused: Not on file    Physically abused: Not on file    Forced sexual activity: Not on file  Other Topics Concern  . Not on file  Social History Narrative  . Not on file     Review of Systems: General: negative for chills, fever, night sweats or weight changes.  Cardiovascular: negative for chest pain, dyspnea on exertion, edema, orthopnea, palpitations, paroxysmal nocturnal dyspnea or shortness of breath Dermatological: negative for rash Respiratory: negative for cough or wheezing Urologic: negative for hematuria Abdominal: negative for nausea, vomiting, diarrhea, bright red blood per rectum, melena, or hematemesis Neurologic: negative for visual changes, syncope, or dizziness All other systems reviewed and are otherwise negative except as noted above.    Blood pressure (!) 115/58, pulse 89, temperature (!) 96.9 F (36.1 C), height 5' 4.5" (1.638 m), weight 130 lb 6.4 oz (59.1 kg), SpO2 100 %.  General appearance: alert and no distress Neck: no adenopathy, no carotid bruit, no JVD, supple, symmetrical, trachea midline and thyroid not enlarged, symmetric, no tenderness/mass/nodules Lungs: clear to auscultation bilaterally Heart: regular rate and rhythm, S1, S2 normal, no murmur, click, rub or gallop Extremities: extremities normal, atraumatic, no cyanosis or edema Pulses:  Diminished pedal pulses Skin: Status post right transmetatarsal amputation, currently wrapped Neurologic: Alert and oriented X 3, normal strength and tone. Normal symmetric reflexes. Normal coordination and gait  EKG not performed today  ASSESSMENT AND PLAN:   Critical lower limb ischemia Ms. Klostermann returns today after recent transmetatarsal amputation performed by Dr. Jacqualyn Posey 08/07/2019 after I performed directional atherectomy and drug-coated balloon angioplasty of the SFA and popliteal artery on the right.  She did have right common femoral endarterectomy and patch angioplasty by Dr. Trula Slade 07/21/2019.  Her wound is slowly healing.  She did see Dr. Earleen Newport in the office this morning.  Her Dopplers reveal a widely patent SFA and tibioperoneal trunk.  I will see her back in 3 months for follow-up.      [name] MD FACP,FACC,FAHA, [facility] 08/18/2019 1:51 PM

## 2019-08-18 NOTE — Patient Instructions (Addendum)
Medication Instructions:  Your physician recommends that you continue on your current medications as directed. Please refer to the Current Medication list given to you today.  If you need a refill on your cardiac medications before your next appointment, please call your pharmacy.   Lab work: NONE  Testing/Procedures: IN 6 MONTHS: Your physician has requested that you have a lower extremity arterial exercise duplex. During this test, exercise and ultrasound are used to evaluate arterial blood flow in the legs. Allow one hour for this exam. There are no restrictions or special instructions.  AND   Your physician has requested that you have an abdominal aorta duplex. During this test, an ultrasound is used to evaluate the aorta. Allow 30 minutes for this exam. Do not eat after midnight the day before and avoid carbonated beverages   Follow-Up: At [facility], you and your health needs are our priority.  As part of our continuing mission to provide you with exceptional heart care, we have created designated Provider Care Teams.  These Care Teams include your primary Cardiologist (physician) and Advanced Practice Providers (APPs -  Physician Assistants and Nurse Practitioners) who all work together to provide you with the care you need, when you need it. You may see [name], MD or one of the following Advanced Practice Providers on your designated Care Team:    Kerin Ransom, PA-C  [place], [place]  Coletta Memos, [place]  Your physician wants you to follow-up in: 3 months. You will receive a reminder letter in the mail two months in advance. If you don't receive a letter, please call our office to schedule the follow-up appointment.

## 2019-08-18 NOTE — Assessment & Plan Note (Addendum)
Ms. Barile returns today after recent transmetatarsal amputation performed by Dr. Jacqualyn Posey 08/07/2019 after I performed directional atherectomy and drug-coated balloon angioplasty of the SFA and popliteal artery on the right.  She did have right common femoral endarterectomy and patch angioplasty by Dr. Trula Slade 07/21/2019.  Her wound is slowly healing.  She did see Dr. Earleen Newport in the office this morning.  Her Dopplers reveal a widely patent SFA and tibioperoneal trunk.  I will see her back in 3 months for follow-up.

## 2019-08-18 NOTE — Progress Notes (Signed)
Subjective: [name] is a 78 y.o. is seen today in office s/p right TMA preformed on 08/07/2019.  She states that she is having pain mostly at nighttime and takes Lunesta only at night.  She is been wearing the cam boot and she can try to limit her activity level.  She denies any fevers, chills, nausea, vomiting.  No calf pain, chest pain or shortness of breath.  Objective: General: No acute distress, AAOx3  Neurovascular status unchanged RIGHT foot: Incision appears to be well coapted with staples, sutures intact.  There is mild erythema along the incision but there is no ascending cellulitis or increase in warmth of the foot.  Some macerated tissue present on the central aspect of the incision.  There is no drainage or pus coming from incision site but there was some bloody drainage on the dressing.  Not able to identify any areas of fluctuation or crepitation.  There is no malodor. No pain with calf compression, swelling, warmth, erythema.   Assessment and Plan:  Status post right transmetatarsal amputation, mild erythema  -Treatment options discussed including all alternatives, risks, and complications -To the lateral inframammary start antibiotics.  Prescribed doxycycline.  Betadine was painted over the incision followed by nonstick dressing and a dry sterile dressing.  Will follow up with the wound care home health nurse to see about coming to the house for dressing changes.  Continue cam boot, but activity and elevation. -Monitor for any clinical signs or symptoms of infection and directed to call the office immediately should any occur or go to the ER.  Return for wound check on Monday as scheduled.  Trula Slade DPM

## 2019-08-19 ENCOUNTER — Telehealth: Payer: Self-pay | Admitting: *Deleted

## 2019-08-19 DIAGNOSIS — Z4781 Encounter for orthopedic aftercare following surgical amputation: Secondary | ICD-10-CM | POA: Diagnosis not present

## 2019-08-19 DIAGNOSIS — K219 Gastro-esophageal reflux disease without esophagitis: Secondary | ICD-10-CM | POA: Diagnosis not present

## 2019-08-19 DIAGNOSIS — E1152 Type 2 diabetes mellitus with diabetic peripheral angiopathy with gangrene: Secondary | ICD-10-CM | POA: Diagnosis not present

## 2019-08-19 DIAGNOSIS — I10 Essential (primary) hypertension: Secondary | ICD-10-CM | POA: Diagnosis not present

## 2019-08-19 DIAGNOSIS — D649 Anemia, unspecified: Secondary | ICD-10-CM | POA: Diagnosis not present

## 2019-08-19 DIAGNOSIS — E785 Hyperlipidemia, unspecified: Secondary | ICD-10-CM | POA: Diagnosis not present

## 2019-08-19 NOTE — Telephone Encounter (Signed)
PT can do this but I want her to be PWB to the heel in the CAM Boot. I do not want full weight on the foot if able.

## 2019-08-19 NOTE — Telephone Encounter (Signed)
I informed [facility] of Dr. Leigh Aurora wound care orders of 08/18/2019.

## 2019-08-19 NOTE — Telephone Encounter (Signed)
I called [facility] and informed of Dr. Leigh Aurora orders from 08/13/2019. [facility] states they will go out once a week and instruct pt and caregiver on wound care.

## 2019-08-19 NOTE — Telephone Encounter (Signed)
Left message informing J. Heber Tohatchi, PT of Dr. Leigh Aurora 08/19/2019 1:14pm orders.

## 2019-08-19 NOTE — Telephone Encounter (Signed)
[facility] Dean[facility], PT request orders for PT hip strengthening, fall prevention, and weight bearing for 1 x wk for 1wk, 2 x wk for 2 wks, 6 x wk for 1 wk.

## 2019-08-19 NOTE — Telephone Encounter (Signed)
[facility] called for wound care orders.

## 2019-08-19 NOTE — Telephone Encounter (Signed)
[name] states she is needing wound care orders.

## 2019-08-23 ENCOUNTER — Other Ambulatory Visit: Payer: Self-pay | Admitting: Endocrinology

## 2019-08-23 ENCOUNTER — Other Ambulatory Visit: Payer: Self-pay | Admitting: Podiatry

## 2019-08-24 ENCOUNTER — Encounter: Payer: Medicare Other | Admitting: Podiatry

## 2019-08-24 ENCOUNTER — Ambulatory Visit (INDEPENDENT_AMBULATORY_CARE_PROVIDER_SITE_OTHER): Payer: Medicare Other | Admitting: Podiatry

## 2019-08-24 ENCOUNTER — Other Ambulatory Visit: Payer: Self-pay

## 2019-08-24 ENCOUNTER — Telehealth: Payer: Self-pay | Admitting: *Deleted

## 2019-08-24 DIAGNOSIS — I739 Peripheral vascular disease, unspecified: Secondary | ICD-10-CM

## 2019-08-24 DIAGNOSIS — I96 Gangrene, not elsewhere classified: Secondary | ICD-10-CM

## 2019-08-24 DIAGNOSIS — S98911A Complete traumatic amputation of right foot, level unspecified, initial encounter: Secondary | ICD-10-CM

## 2019-08-24 MED ORDER — DOXYCYCLINE HYCLATE 100 MG PO TABS: 100.0000 mg | ORAL_TABLET | Freq: Two times a day (BID) | ORAL | 0 refills | Status: DC

## 2019-08-24 NOTE — Telephone Encounter (Signed)
I spoke with pt, to see if she was having symptoms of infections. Pt states she would only need the doxycycline refill if Dr. Jacqualyn Posey felt so, and she would see him this morning. I told pt I would forward the refill request to Dr. Jacqualyn Posey so he would be aware.

## 2019-08-24 NOTE — Telephone Encounter (Signed)
-----   Message from Trula Slade, DPM sent at 08/24/2019  9:54 AM EST ----- Can you please put a referral back into the wound care center for her? Thanks.

## 2019-08-24 NOTE — Telephone Encounter (Signed)
Prepared required form, clinicals 08/13/2019, 08/18/2019 including 08/07/2019 Op notes from Dr. Trula Slade and Dr. Jacqualyn Posey, demographics to be faxed to [facility] once 08/24/2019 clinicals are available.

## 2019-08-24 NOTE — Progress Notes (Signed)
Subjective: [name] is a 78 y.o. is seen today in office s/p right TMA preformed on 08/07/2019.  She states her pain is much improved.  The sharp pain that she is having is much improved as well.  She has been using the cam boot just the operative foot.  She still on the doxycycline but she just recently finished. She denies any fevers, chills, nausea, vomiting.  No calf pain, chest pain or shortness of breath.  Objective: General: No acute distress, AAOx3  Neurovascular status unchanged RIGHT foot: Incision appears to be well coapted with staples, sutures intact.  There is macerated tissue along the incision as well as some hyperpigmentation.  There is some serosanguineous drainage expressed but there is no purulence.  There is still mild surrounding erythema at the distal aspect but no ascending cellulitis.  There is no fluctuation crepitation.  There is no significant warmth of the foot.  There is no malodor. No pain with calf compression, swelling, warmth, erythema.   Assessment and Plan:  Status post right transmetatarsal amputation, mild erythema/macerated tissue  -Treatment options discussed including all alternatives, risks, and complications -Overall incision still coapted.  However due to the drainage I would continue antibiotics and refill doxycycline.  Today we applied Betadine to the wound followed by dry sterile dressing.  I will also refer her back to the wound care center to help the and healing as well as for evaluation of the left fourth toe.  I am happy to see her back as well but if she gets in the wound care center they continue to treat her for this I will defer my treatment to them.  -Continue CAM boot, PWB to heel.  -Monitor for any clinical signs or symptoms of infection and directed to call the office immediately should any occur or go to the ER.  Return in about 1 week (around 08/31/2019).  Trula Slade DPM

## 2019-08-25 ENCOUNTER — Ambulatory Visit: Payer: Medicare Other | Admitting: Podiatry

## 2019-08-25 NOTE — Telephone Encounter (Signed)
Faxed required form, clinicals and 08/24/2019 clinicals and demographics to [facility].

## 2019-08-25 NOTE — Telephone Encounter (Signed)
done

## 2019-08-26 DIAGNOSIS — Z4781 Encounter for orthopedic aftercare following surgical amputation: Secondary | ICD-10-CM | POA: Diagnosis not present

## 2019-08-26 DIAGNOSIS — D649 Anemia, unspecified: Secondary | ICD-10-CM | POA: Diagnosis not present

## 2019-08-26 DIAGNOSIS — K219 Gastro-esophageal reflux disease without esophagitis: Secondary | ICD-10-CM | POA: Diagnosis not present

## 2019-08-26 DIAGNOSIS — E785 Hyperlipidemia, unspecified: Secondary | ICD-10-CM | POA: Diagnosis not present

## 2019-08-26 DIAGNOSIS — I10 Essential (primary) hypertension: Secondary | ICD-10-CM | POA: Diagnosis not present

## 2019-08-26 DIAGNOSIS — E1152 Type 2 diabetes mellitus with diabetic peripheral angiopathy with gangrene: Secondary | ICD-10-CM | POA: Diagnosis not present

## 2019-08-27 ENCOUNTER — Other Ambulatory Visit: Payer: Self-pay

## 2019-08-27 ENCOUNTER — Encounter (HOSPITAL_BASED_OUTPATIENT_CLINIC_OR_DEPARTMENT_OTHER): Payer: Medicare Other | Attending: Internal Medicine | Admitting: Internal Medicine

## 2019-08-27 DIAGNOSIS — S90421A Blister (nonthermal), right great toe, initial encounter: Secondary | ICD-10-CM | POA: Diagnosis not present

## 2019-08-27 DIAGNOSIS — E1142 Type 2 diabetes mellitus with diabetic polyneuropathy: Secondary | ICD-10-CM | POA: Insufficient documentation

## 2019-08-27 DIAGNOSIS — Z881 Allergy status to other antibiotic agents status: Secondary | ICD-10-CM | POA: Diagnosis not present

## 2019-08-27 DIAGNOSIS — Z95820 Peripheral vascular angioplasty status with implants and grafts: Secondary | ICD-10-CM | POA: Insufficient documentation

## 2019-08-27 DIAGNOSIS — L97528 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ity: Secondary | ICD-10-CM | POA: Insufficient documentation

## 2019-08-27 DIAGNOSIS — Z885 Allergy status to narcotic agent status: Secondary | ICD-10-CM | POA: Diagnosis not present

## 2019-08-27 DIAGNOSIS — I10 Essential (primary) hypertension: Secondary | ICD-10-CM | POA: Diagnosis not present

## 2019-08-27 DIAGNOSIS — E11621 Type 2 diabetes mellitus with foot ulcer: Secondary | ICD-10-CM | POA: Diagnosis not present

## 2019-08-27 DIAGNOSIS — Z89421 Acquired absence of other right toe(s): Secondary | ICD-10-CM | POA: Insufficient documentation

## 2019-08-27 DIAGNOSIS — E11319 Type 2 diabetes mellitus with unspecified diabetic retinopathy without macular edema: Secondary | ICD-10-CM | POA: Insufficient documentation

## 2019-08-27 DIAGNOSIS — E1152 Type 2 diabetes mellitus with diabetic peripheral angiopathy with gangrene: Secondary | ICD-10-CM | POA: Diagnosis not present

## 2019-08-27 DIAGNOSIS — T8189XA Other complications of procedures, not elsewhere classified, initial encounter: Secondary | ICD-10-CM | POA: Diagnosis not present

## 2019-08-27 DIAGNOSIS — L97518 Non-pressure chronic ulcer of other part of right foot with other specified severity: Secondary | ICD-10-CM | POA: Insufficient documentation

## 2019-08-27 DIAGNOSIS — S90425A Blister (nonthermal), left lesser toe(s), initial encounter: Secondary | ICD-10-CM | POA: Diagnosis not present

## 2019-08-27 DIAGNOSIS — S90424A Blister (nonthermal), right lesser toe(s), initial encounter: Secondary | ICD-10-CM | POA: Diagnosis not present

## 2019-08-28 DIAGNOSIS — E785 Hyperlipidemia, unspecified: Secondary | ICD-10-CM | POA: Diagnosis not present

## 2019-08-28 DIAGNOSIS — K219 Gastro-esophageal reflux disease without esophagitis: Secondary | ICD-10-CM | POA: Diagnosis not present

## 2019-08-28 DIAGNOSIS — I10 Essential (primary) hypertension: Secondary | ICD-10-CM | POA: Diagnosis not present

## 2019-08-28 DIAGNOSIS — E1152 Type 2 diabetes mellitus with diabetic peripheral angiopathy with gangrene: Secondary | ICD-10-CM | POA: Diagnosis not present

## 2019-08-28 DIAGNOSIS — D649 Anemia, unspecified: Secondary | ICD-10-CM | POA: Diagnosis not present

## 2019-08-28 DIAGNOSIS — Z4781 Encounter for orthopedic aftercare following surgical amputation: Secondary | ICD-10-CM | POA: Diagnosis not present

## 2019-08-28 NOTE — Progress Notes (Signed)
MEHER, [name] ([id number]) Visit Report for 08/27/2019 HPI Details Patient Name: Date of Service: [name], [name] 08/27/2019 1:45 PM Medical Record Number:[id number] Patient Account Number: [id number] Date of Birth/Sex: Treating RN: 04-29-1941 (78 y.o. [name] Primary Care Provider: [name] Other Clinician: Sandre Kitty Referring Provider: Treating Provider/[name]:Deserea Bordley, [name], [name] Weeks in Treatment: 6 History of Present Illness HPI Description: ADMISSION 07/13/2019 Patient is a 78 year old type II diabetic. She has known PAD. She has been followed by Dr. Jacqualyn Posey of podiatry for blistering areas on her toes dating back to 04/30/2019 which at this was the left fourth toe. By 8/11 she had wounds on the right and left second toes. She underwent arterial studies by Dr. Alvester Chou on 7/31 that showed ABIs on the right of 0.60 TBI of 0.26 on the left ABI of 0.56 and a TBI of 0.25. By 9/10 she had ischemic-looking wounds per Dr. Earleen Newport on the right first, left first second and third. She has been using Medihoney and then mupirocin more recently simply Betadine. The patient underwent angiography by Dr. Gwenlyn Found on 9/21. 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 Noted that there was 1 vessel runoff in the foot via the peroneal. On the left there was an 80% left SFA, 70% mid left SFA. There was a short segment calcified CTO distal less than SFA and above-knee popliteal artery reconstituting with two-vessel runoff. 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 It was felt that she had bilateral SFA disease as well as tibial vessel disease. An attempt was made to revascularize the left SFA but they were unable to cross because of the highly calcified nature of the lesion. 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 She has an area on the medial left fourth toe with raised horned callus on top of this. I am not certain what this represents. With regards to pain she has about a 2-1/2 I will claudication tolerance in the grocery store. She has some pain in night which is relieved by putting her feet down over the side of the bed. She is wearing Nitro-Dur patches on the top of her right foot. Past medical history includes type 2 diabetes with secondary PAD, neoplasm of the skin, diabetic retinopathy, carotid artery stenosis, hyperlipidemia hypertension. 11/2; the last time the patient was here I spoke to Dr. Gwenlyn Found about revascularization options on the right. As I understand things currently Dr. Gwenlyn Found spoke with Dr. Trula Slade and ultimately the patient was taken to surgery on 10/27. She had a right iliofemoral endarterectomy with a bovine patch angioplasty. I think the plan now is for her to have a staged intervention on the right SFA by Dr. Alvester Chou. Per the patient's understanding Dr. Gwenlyn Found and Dr. Jacqualyn Posey are waiting to see when the gangrenous toes on the right foot can be amputated. The patient states her pain is a lot better and she is grateful for that. 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 Small area on the left fourth toe. She is using Betadine to the mummified areas on the right and Medihoney on the left 08/27/2019; the last time I saw this patient I discharged her from the clinic. She had been revascularized by Dr. Trula Slade and  she had a right iliofemoral endarterectomy with a bovine angioplasty. 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 Jacqualyn Posey of podiatry on 08/07/2019. I note that she also had a intervention by Dr. Gwenlyn Found and he performed a directional atherectomy and drug-coated balloon angioplasty of the SFA and popliteal artery on  the right. I am not certain of the date of Dr. Kennon Holter procedure as of the time of this dictation. She was referred back to [place] by Dr. Earleen Newport predominantly for follow-up of the left fourth toe. She still has sutures and stitches in the right TMA site. She states her pain is a lot better. She expresses concern about the condition of the amputation site at the TMA. She is on doxycycline I think prescribed by Dr. Earleen Newport. She is complaining of some pain at night Electronic Signature(s) Signed: 08/28/2019 7:57:15 AM By: [name] MD Entered By: [name] on 08/27/2019 16:11:59 -------------------------------------------------------------------------------- Physical Exam Details Patient Name: Date of Service: [name] 08/27/2019 1:45 PM Medical Record Number:[id number] Patient Account Number: [id number] Date of Birth/Sex: Treating RN: [birth year] (78 y.o. [name] Primary Care Provider: [name] Other Clinician: Sandre Kitty Referring Provider: Treating Provider/[name]:Juriel Cid, [name], [name] Weeks in Treatment: 6 Constitutional Sitting or standing Blood Pressure is within target range for patient.. Pulse regular and within target range for patient.Marland Kitchen Respirations regular, non-labored and within target range.. Temperature is normal and within the target range for the patient.Marland Kitchen Appears in no distress. Respiratory work of breathing is normal. Bilateral breath sounds are clear and equal in all lobes with no wheezes, rales or rhonchi.. Cardiovascular Heart rhythm and rate regular, without murmur or gallop.. Femoral and popliteal pulses are palpable. Dorsalis pedis pulses palpable on the right and her foot is warmer. Not palpable on the left and the foot is cool. Dorsalis pedis pulses are not palpable on either side. Musculoskeletal Right TMA amputation site is still sutured. The lateral part of this incision looks somewhat vulnerable to me  medial part looks better. Unfortunately I think if we took any sutures out now I think we would be looking at the dehiscence in short order.Marland Kitchen Psychiatric appears at normal baseline. Notes Wound exam On the right she has a right TMA site which is still sutured but looking vulnerable on the lateral aspect. The left fourth toe medial aspect over the PIP. Necrotic covering this probes to bone. No debridement was done in 9 any site. Electronic Signature(s) Signed: 08/28/2019 7:57:15 AM By: [name] MD Entered By: [name] on 08/27/2019 16:13:55 -------------------------------------------------------------------------------- Physician Orders Details Patient Name: Date of Service: [name] 08/27/2019 1:45 PM Medical Record Number:[id number] Patient Account Number: [id number] Date of Birth/Sex: Treating RN: [birth year] (78 y.o. 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 - Thursday Dressing Change Frequency Other: - home health to change twice a week. Wound Cleansing Clean wound with Wound Cleanser Primary Wound Dressing Wound #7 Right Amputation Site - Transmetatarsal Calcium Alginate  with Silver Wound #1 Left,Dorsal Toe Fourth Iodoflex Secondary Dressing Wound #7 Right Amputation Site -  Transmetatarsal Kerlix/Rolled Gauze ABD pad Wound #1 Left,Dorsal Toe Fourth Kerlix/Rolled Gauze Dry Gauze Edema Control Avoid standing for long periods of time Elevate legs to the level of the heart or above for 30 minutes daily and/or when sitting, a frequency of: - throughout the day. [facility] for wound care. - [facility] twice a week. Electronic Signature(s) Signed: 08/27/2019 6:37:14 PM By: Deon Pilling Signed: 08/28/2019 7:57:15 AM By: [name] MD Entered By: Deon Pilling on 08/27/2019 15:31:48 -------------------------------------------------------------------------------- Problem List Details Patient Name: Date of Service: [name] 08/27/2019 1:45 PM Medical Record Number:[id number] Patient Account Number: [id number] Date of Birth/Sex: Treating RN: 1941/04/14 (77 y.o. [name], [name] Primary Care Provider: [name] Other Clinician: Sandre Kitty Referring Provider: Treating Provider/[name]:Kennadie Brenner, [name], [name] Weeks in Treatment: 6 Active Problems ICD-10 Evaluated Encounter Code Description Active Date Today Diagnosis E11.621 Type 2 diabetes mellitus with foot ulcer 07/13/2019 No Yes E11.52 Type 2 diabetes mellitus with diabetic peripheral 07/13/2019 No Yes angiopathy with gangrene L97.518 Non-pressure chronic ulcer of other part of right foot 07/13/2019 No Yes with other specified severity L97.528 Non-pressure chronic ulcer of other part of left foot 07/13/2019 No Yes with other specified severity E11.42 Type 2 diabetes mellitus with diabetic polyneuropathy 07/13/2019 No Yes Inactive Problems Resolved Problems Electronic Signature(s) Signed: 08/28/2019 7:57:15 AM By: [name] MD Entered By: [name] on 08/27/2019 16:07:37 -------------------------------------------------------------------------------- Progress Note Details Patient Name: Date of  Service: [name] 08/27/2019 1:45 PM Medical Record Number:[id number] Patient Account Number: [id number] Date of Birth/Sex: Treating RN: 09-06-1941 (78 y.o. [name] Primary Care Provider: [name] Other Clinician: Sandre Kitty Referring Provider: Treating Provider/[name]:Isaac Dubie, [name], [name] Weeks in Treatment: 6 Subjective History of Present Illness (HPI) ADMISSION 07/13/2019 Patient is a 78 year old type II diabetic. She has known PAD. She has been followed by Dr. Jacqualyn Posey of podiatry for blistering areas on her toes dating back to 04/30/2019 which at this was the left fourth toe. By 8/11 she had wounds on the right and left second toes. She underwent arterial studies by Dr. Alvester Chou on 7/31 that showed ABIs on the right of 0.60 TBI of 0.26 on the left ABI of 0.56 and a TBI of 0.25. By 9/10 she had ischemic-looking wounds per Dr. Earleen Newport on the right first, left first second and third. She has been using Medihoney and then mupirocin more recently simply Betadine. The patient underwent angiography by Dr. Gwenlyn Found on 9/21. 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 Noted that there was 1 vessel runoff in the foot via the peroneal. ooOn the left there was an 80% left SFA, 70% mid left SFA. There was a short segment calcified CTO distal less than SFA and above-knee popliteal artery reconstituting with two-vessel runoff. 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 It was felt that she had bilateral SFA disease as well as tibial vessel disease. An attempt was made to revascularize the left SFA but they were unable to cross because of the highly calcified nature of the lesion. 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  She has an area on the medial left fourth  toe with raised horned callus on top of this. I am not certain what this represents. With regards to pain she has about a 2-1/2 I will claudication tolerance in the grocery store. She has some pain in night which is relieved by putting her feet down over the side of the bed. She is wearing Nitro-Dur patches on the top of her right foot. Past medical history includes type 2 diabetes with secondary PAD, neoplasm of the skin, diabetic retinopathy, carotid artery stenosis, hyperlipidemia hypertension. 11/2; the last time the patient was here I spoke to Dr. Gwenlyn Found about revascularization options on the right. As I understand things currently Dr. Gwenlyn Found spoke with Dr. Trula Slade and ultimately the patient was taken to surgery on 10/27. She had a right iliofemoral endarterectomy with a bovine patch angioplasty. I think the plan now is for her to have a staged intervention on the right SFA by Dr. Alvester Chou. Per the patient's understanding Dr. Gwenlyn Found and Dr. Jacqualyn Posey are waiting to see when the gangrenous toes on the right foot can be amputated. The patient states her pain is a lot better and she is grateful for that. 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 Small area on the left fourth toe. She is using Betadine to the mummified areas on the right and Medihoney on the left 08/27/2019; the last time I saw this patient I discharged her from the clinic. She had been revascularized by Dr. Trula Slade and she had a right iliofemoral endarterectomy with a bovine angioplasty. 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 Jacqualyn Posey of podiatry on 08/07/2019. I note that she also had a intervention by Dr. Gwenlyn Found and he performed a directional atherectomy and drug-coated balloon angioplasty of the SFA and popliteal artery on the right. I am not certain of the date of Dr. Kennon Holter procedure as of the time of this  dictation. She was referred back to [place] by Dr. Earleen Newport predominantly for follow-up of the left fourth toe. She still has sutures and stitches in the right TMA site. She states her pain is a lot better. She expresses concern about the condition of the amputation site at the TMA. She is on doxycycline I think prescribed by Dr. Earleen Newport. She is complaining of some pain at night Objective Constitutional Sitting or standing Blood Pressure is within target range for patient.. Pulse regular and within target range for patient.Marland Kitchen Respirations regular, non-labored and within target range.. Temperature is normal and within the target range for the patient.Marland Kitchen Appears in no distress. Vitals Time Taken: 2:53 PM, Height: 64 in, Weight: 142 lbs, BMI: 24.4, Temperature: 97.8 F, Pulse: 74 bpm, Respiratory Rate: 18 breaths/min, Blood Pressure: 127/36 mmHg, Capillary Blood Glucose: 122 mg/dl. General Notes: patient reported last CBG of 122 Respiratory work of breathing is normal. Bilateral breath sounds are clear and equal in all lobes with no wheezes, rales or rhonchi.. Cardiovascular Heart rhythm and rate regular, without murmur or gallop.. Femoral and popliteal pulses are palpable. Dorsalis pedis pulses palpable on the right and her foot is warmer. Not palpable on the left and the foot is cool. Dorsalis pedis pulses are not palpable on either side. Musculoskeletal Right TMA amputation site is still sutured. The lateral part of this incision looks somewhat vulnerable to me medial part looks better. Unfortunately I think if we took any sutures out now I think we would be looking at the dehiscence in short order.Marland Kitchen Psychiatric appears  at normal baseline. General Notes: Wound exam ooOn the right she has a right TMA site which is still sutured but looking vulnerable on the lateral aspect. ooThe left fourth toe medial aspect over the PIP. Necrotic covering this probes to bone. ooNo debridement was done in 9 any  site. Integumentary (Hair, Skin) Wound #1 status is Open. Original cause of wound was Blister. The wound is located on the Left,Dorsal Toe Fourth. The wound measures 0.7cm length x 0.8cm width x 0.4cm depth; 0.44cm^2 area and 0.176cm^3 volume. There is Fat Layer (Subcutaneous Tissue) Exposed exposed. There is no tunneling or undermining noted. There is a small amount of purulent drainage noted. The wound margin is distinct with the outline attached to the wound base. There is small (1-33%) pink granulation within the wound bed. There is a large (67-100%) amount of necrotic tissue within the wound bed including Eschar and Adherent Slough. Wound #2 status is Amputation. Original cause of wound was Blister. The wound is located on the Right,Circumferential Toe Great. The wound measures 0cm length x 0cm width x 0cm depth; 0cm^2 area and 0cm^3 volume. There is no tunneling or undermining noted. There is a none present amount of drainage noted. The wound margin is flat and intact. There is no granulation within the wound bed. There is no necrotic tissue within the wound bed. Wound #3 status is Amputation. Original cause of wound was Blister. The wound is located on the Right,Circumferential Toe Second. The wound measures 0cm length x 0cm width x 0cm depth; 0cm^2 area and 0cm^3 volume. There is no tunneling or undermining noted. There is a none present amount of drainage noted. The wound margin is flat and intact. There is no granulation within the wound bed. There is no necrotic tissue within the wound bed. Wound #4 status is Amputation. Original cause of wound was Blister. The wound is located on the Right,Circumferential Toe Third. The wound measures 0cm length x 0cm width x 0cm depth; 0cm^2 area and 0cm^3 volume. There is no tunneling or undermining noted. There is a none present amount of drainage noted. The wound margin is flat and intact. There is no granulation within the wound bed. There is no  necrotic tissue within the wound bed. Wound #5 status is Amputation. Original cause of wound was Blister. The wound is located on the Right Toe Fourth. The wound measures 0cm length x 0cm width x 0cm depth; 0cm^2 area and 0cm^3 volume. There is no tunneling or undermining noted. There is a none present amount of drainage noted. The wound margin is flat and intact. There is no granulation within the wound bed. There is no necrotic tissue within the wound bed. Wound #6 status is Amputation. Original cause of wound was Blister. The wound is located on the Right Toe Fifth. The wound measures 0cm length x 0cm width x 0cm depth; 0cm^2 area and 0cm^3 volume. There is no tunneling or undermining noted. There is a none present amount of drainage noted. The wound margin is flat and intact. There is no granulation within the wound bed. There is no necrotic tissue within the wound bed. Wound #7 status is Open. Original cause of wound was Surgical Injury. 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 The wound measures 1.2cm length x 11cm width x 0.1cm depth; 10.367cm^2 area and 1.037cm^3 volume. There is Fat Layer (Subcutaneous Tissue) Exposed exposed. There is no tunneling or undermining noted. There is a medium amount of serous drainage noted. The  wound margin is distinct with the outline attached to the wound base. There is medium (34-66%) pink granulation within the wound bed. There is a medium (34-66%) amount of necrotic tissue within the wound bed including Eschar and Adherent Slough. 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 - Thursday Dressing Change Frequency: Other: - home health to change twice a week. Wound Cleansing: Clean wound with Wound Cleanser Primary Wound Dressing: Wound #7 Right Amputation Site - Transmetatarsal: Calcium Alginate with Silver Wound #1 Left,Dorsal Toe Fourth: Iodoflex Secondary Dressing: Wound #7 Right Amputation Site - Transmetatarsal: Kerlix/Rolled Gauze ABD pad Wound #1 Left,Dorsal Toe Fourth: Kerlix/Rolled Gauze Dry Gauze Edema Control: Avoid standing for long periods of time Elevate legs to the level of the heart or above for 30 minutes daily and/or when sitting, a frequency of: - throughout the day. Home Health: [facility] for [facility] care. - [facility] twice a week. 1. Silver alginate to the TMA site and Iodoflex to the fourth toes 2. She has home health changing this twice a week and will see her in 1 week's time 3. I will reach out to Dr. Earleen Newport to get his feel about when he wants the sutures removed. I did not feel that they needed to be done yet even though it was 3 weeks since her surgery 4. She was revascularized by Dr. Gwenlyn Found after Dr. Trula Slade surgery. She has had follow-up Dopplers. Her ABI on the right is noncompressible at 1.43 waveforms monophasic on the left her ABI was also noncompressible. TBI on the left was 0. It was notable that her waveforms the tibial waveforms on the right appeared better than previous study on 06/30/2019 left tibial waveforms were essentially unchanged 5. The patient is on doxycycline there is nothing worth culturing at this point. Electronic Signature(s) Signed: 08/28/2019 7:57:15 AM By: [name] MD Entered By: [name] on 08/27/2019 16:16:25 -------------------------------------------------------------------------------- SuperBill Details Patient Name: Date of Service: [name] 08/27/2019 Medical  Record Number:[id number] Patient Account Number: [id number] Date of Birth/Sex: Treating RN: 1941-09-15 (78 y.o. 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 Facility Procedures The patient participates with Medicare or their insurance follows the Medicare Facility Guidelines: CPT4 Code Description Modifier Quantity [phone] - WOUND CARE VISIT-LEV 5 EST PT 1 Physician Procedures CPT4 Code Description: [id number] - WC PHYS LEVEL 4 - EST PT ICD-10 Diagnosis Description L97.518 Non-pressure chronic ulcer of other part of right foot with L97.528 Non-pressure chronic ulcer of other part of left foot with o E11.621 Type 2 diabetes  mellitus with foot ulcer E11.52 Type 2 diabetes mellitus with diabetic peripheral angiopathy Modifier: other specified ther specified s with gangrene Quantity:  1 severity everity Electronic Signature(s) Signed: 08/28/2019 7:57:15 AM By: [name] MD Entered By: [name] on 08/27/2019 16:17:02

## 2019-08-31 ENCOUNTER — Encounter: Payer: Medicare Other | Admitting: Podiatry

## 2019-08-31 DIAGNOSIS — K219 Gastro-esophageal reflux disease without esophagitis: Secondary | ICD-10-CM | POA: Diagnosis not present

## 2019-08-31 DIAGNOSIS — E785 Hyperlipidemia, unspecified: Secondary | ICD-10-CM | POA: Diagnosis not present

## 2019-08-31 DIAGNOSIS — I10 Essential (primary) hypertension: Secondary | ICD-10-CM | POA: Diagnosis not present

## 2019-08-31 DIAGNOSIS — E1152 Type 2 diabetes mellitus with diabetic peripheral angiopathy with gangrene: Secondary | ICD-10-CM | POA: Diagnosis not present

## 2019-08-31 DIAGNOSIS — D649 Anemia, unspecified: Secondary | ICD-10-CM | POA: Diagnosis not present

## 2019-08-31 DIAGNOSIS — Z4781 Encounter for orthopedic aftercare following surgical amputation: Secondary | ICD-10-CM | POA: Diagnosis not present

## 2019-09-01 ENCOUNTER — Other Ambulatory Visit: Payer: Self-pay

## 2019-09-01 ENCOUNTER — Ambulatory Visit (INDEPENDENT_AMBULATORY_CARE_PROVIDER_SITE_OTHER): Payer: Self-pay | Admitting: Podiatry

## 2019-09-01 ENCOUNTER — Encounter: Payer: Self-pay | Admitting: Podiatry

## 2019-09-01 DIAGNOSIS — S98911A Complete traumatic amputation of right foot, level unspecified, initial encounter: Secondary | ICD-10-CM

## 2019-09-01 DIAGNOSIS — I96 Gangrene, not elsewhere classified: Secondary | ICD-10-CM

## 2019-09-01 DIAGNOSIS — I739 Peripheral vascular disease, unspecified: Secondary | ICD-10-CM

## 2019-09-01 NOTE — Progress Notes (Signed)
[name], [name] ([id number]) Visit Report for 07/13/2019 Chief Complaint Document Details Patient Name: Date of Service: [name], [name] 07/13/2019 9:00 AM Medical Record Number:[id number] Patient Account Number: [id number] Date of Birth/Sex: Treating RN: 24-Aug-1941 (78 y.o. [name] Primary Care Provider: [name] Other Clinician: Referring Provider: Treating Provider/[name]:Jeanise Durfey, [name], [name] Weeks in Treatment: 0 Information Obtained from: Patient Chief Complaint 07/13/2019; patient is here for review wounds on the toes of her right foot and the left fourth toe Electronic Signature(s) Signed: 07/13/2019 5:54:03 PM By: [name] MD Entered By: [name] on 07/13/2019 10:42:08 -------------------------------------------------------------------------------- HPI Details Patient Name: Date of Service: [name] 07/13/2019 9:00 AM Medical Record Number:[id number] Patient Account Number: [id number] Date of Birth/Sex: Treating RN: [birth year] (78 y.o. [name] Primary Care Provider: [name] Other Clinician: Referring Provider: Treating Provider/[name]:Neizan Debruhl, [name], [name] Weeks in Treatment: 0 History of Present Illness HPI Description: ADMISSION 07/13/2019 Patient is a 78 year old type II diabetic. She has known PAD. She has been followed by Dr. Jacqualyn Posey of podiatry for blistering areas on her toes dating back to 04/30/2019 which at this was the left fourth toe. By 8/11 she had wounds on the right and left second toes. She underwent arterial studies by Dr. Alvester Chou on 7/31 that showed ABIs on the right of 0.60 TBI of 0.26 on the left ABI of 0.56 and a TBI of 0.25. By 9/10 she had ischemic-looking wounds per Dr. Earleen Newport on the right first, left first second and third. She has been using Medihoney and then mupirocin more recently simply Betadine. The patient underwent angiography by Dr. Gwenlyn Found on  9/21. 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 Noted that there was 1 vessel runoff in the foot via the peroneal. On the left there was an 80% left SFA, 70% mid left SFA. There was a short segment calcified CTO distal less than SFA and above-knee popliteal artery reconstituting with two-vessel runoff. 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 It was felt that she had bilateral SFA disease as well as tibial vessel disease. An attempt was made to revascularize the left SFA but they were unable to cross because of the highly calcified nature of the lesion. 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 She has an area on the medial left fourth toe with raised horned callus on top of this. I am not certain what this represents. With regards to pain she has about a 2-1/2 I will claudication tolerance in the grocery store. She has some pain in night which is relieved by putting her feet down over the side of the bed. She is wearing Nitro-Dur patches on the top of her right foot. Past medical history includes type 2 diabetes with secondary PAD, neoplasm of the skin, diabetic retinopathy, carotid artery stenosis, hyperlipidemia hypertension. 11/2; the last time the patient was here I spoke to Dr. Gwenlyn Found about revascularization options on the right. As I understand things currently Dr. Gwenlyn Found spoke with Dr. Trula Slade and ultimately the patient was taken to surgery on 10/27. She had a right iliofemoral endarterectomy with a bovine patch angioplasty. I think the plan now is for her to have a staged intervention on the right SFA by Dr. Alvester Chou. Per the patient's understanding Dr. Gwenlyn Found  and Dr. Jacqualyn Posey are waiting to see when the gangrenous toes on the right foot can be  amputated. The patient states her pain is a lot better and she is grateful for that. 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 Small area on the left fourth toe. She is using Betadine to the mummified areas on the right and Medihoney on the left Electronic Signature(s) Signed: 07/27/2019 5:57:33 PM By: [name] MD Previous Signature: 07/13/2019 5:54:03 PM Version By: [name] MD Entered By: [name] on 07/27/2019 11:19:57 -------------------------------------------------------------------------------- Physical Exam Details Patient Name: Date of Service: [name] 07/13/2019 9:00 AM Medical Record Number:[id number] Patient Account Number: [id number] Date of Birth/Sex: Treating RN: 04/20/1941 (78 y.o. [name] Primary Care Provider: [name] Other Clinician: Referring Provider: Treating Provider/[name]:Laden Fieldhouse, [name], [name] Weeks in Treatment: 0 Constitutional Patient is hypertensive.. Pulse regular and within target range for patient.Marland Kitchen Respirations regular, non-labored and within target range.. Temperature is normal and within the target range for the patient.Marland Kitchen Appears in no distress. Eyes Conjunctivae clear. No discharge.no icterus. Respiratory work of breathing is normal. Bilateral breath sounds are clear and equal in all lobes with no wheezes, rales or rhonchi.. Cardiovascular Heart rhythm and rate regular, without murmur or gallop.. Not palpable on the right nor is the popliteal. The left femoral and left popliteal are palpable. Pedal pulses absent bilaterally.. Gastrointestinal (GI) Abdomen is soft and non-distended without masses or tenderness.. Lymphatic None palpable in the popliteal or inguinal areas. Integumentary (Hair, Skin) No primary skin lesion is seen. Neurological There is definitely some loss of light touch to the microfilament and vibration sense in the right  greater than left foot suggestive of diabetic neuropathy. Psychiatric appears at normal baseline. Notes Wound exam On the right she has mummified dry gangrene on the tip of the right first second and third toes. Ischemic spots on the fourth and fifth toes dorsally. There is no evidence of infection although I think the proximal part of her toes is going to become similarly involved as they are dusky and erythematous. On the left fourth toe the only lesion she has is a hyperkeratotic area dorsally. She states this has been there for a year I am not sure that this represents ischemic change at this point. Electronic Signature(s) Signed: 07/13/2019 5:54:03 PM By: [name] MD Entered By: [name] on 07/13/2019 10:51:09 -------------------------------------------------------------------------------- Physician Orders Details Patient Name: Date of Service: [name] 07/13/2019 9:00 AM Medical Record Number:[id number] Patient Account Number: [id number] Date of Birth/Sex: Treating RN: November 18, 1940 (78 y.o. [name] Primary Care Provider: [name] Other Clinician: Referring Provider: Treating Provider/[name]:Paula Busenbark, [name], [name] Weeks in Treatment: 0 Verbal / Phone Orders: No Diagnosis Coding Follow-up Appointments Return Appointment in 1 week. Dressing Change Frequency Change dressing every day. - all wounds Wound Cleansing May shower and wash wound with soap and water. Primary Wound Dressing Wound #1 Left,Dorsal Toe Fourth Medihoney gel Wound #2 Right,Circumferential Toe Great Other: - Paint with betadine Wound #3 Right,Circumferential Toe Second Other: - Paint with betadine Wound #4 Right,Circumferential Toe Third Other: - Paint with betadine Wound #5 Right Toe Fourth Other: - Paint with betadine Wound #6 Right Toe Fifth Other: - Paint with betadine Secondary Dressing Dry Gauze - to all wounds Electronic  Signature(s) Signed: 07/13/2019 5:54:03 PM By: [name] MD Signed: 07/15/2019 6:50:57 PM By: [name] RN, BSN Entered By: [name] on 07/13/2019 10:28:08 -------------------------------------------------------------------------------- Problem List Details Patient  Name: Date of Service: [name], [name] 07/13/2019 9:00 AM Medical Record Number:[id number] Patient Account Number: [id number] Date of Birth/Sex: Treating RN: 1941/05/11 (78 y.o. [name] Primary Care Provider: [name] Other Clinician: Referring Provider: Treating Provider/[name]:Fernande Treiber, [name], [name] Weeks in Treatment: 0 Active Problems ICD-10 Evaluated Encounter Code Description Active Date Today Diagnosis E11.621 Type 2 diabetes mellitus with foot ulcer 07/13/2019 No Yes E11.52 Type 2 diabetes mellitus with diabetic peripheral 07/13/2019 No Yes angiopathy with gangrene L97.518 Non-pressure chronic ulcer of other part of right foot 07/13/2019 No Yes with other specified severity L97.528 Non-pressure chronic ulcer of other part of left foot 07/13/2019 No Yes with other specified severity E11.42 Type 2 diabetes mellitus with diabetic polyneuropathy 07/13/2019 No Yes Inactive Problems Resolved Problems Electronic Signature(s) Signed: 07/27/2019 5:57:33 PM By: [name] MD Previous Signature: 07/13/2019 5:54:03 PM Version By: [name] MD Entered By: [name] on 07/27/2019 11:17:24 -------------------------------------------------------------------------------- Progress Note Details Patient Name: Date of Service: [name] 07/13/2019 9:00 AM Medical Record Number:[id number] Patient Account Number: [id number] Date of Birth/Sex: Treating RN: 1941/07/18 (78 y.o. [name] Primary Care Provider: [name] Other Clinician: Referring Provider: Treating Provider/[name]:Akshat Minehart, [name], [name] Weeks in Treatment:  0 Subjective Chief Complaint Information obtained from Patient 07/13/2019; patient is here for review wounds on the toes of her right foot and the left fourth toe History of Present Illness (HPI) ADMISSION 07/13/2019 Patient is a 78 year old type II diabetic. She has known PAD. She has been followed by Dr. Jacqualyn Posey of podiatry for blistering areas on her toes dating back to 04/30/2019 which at this was the left fourth toe. By 8/11 she had wounds on the right and left second toes. She underwent arterial studies by Dr. Alvester Chou on 7/31 that showed ABIs on the right of 0.60 TBI of 0.26 on the left ABI of 0.56 and a TBI of 0.25. By 9/10 she had ischemic-looking wounds per Dr. Earleen Newport on the right first, left first second and third. She has been using Medihoney and then mupirocin more recently simply Betadine. The patient underwent angiography by Dr. Gwenlyn Found on 9/21. 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 Noted that there was 1 vessel runoff in the foot via the peroneal. ooOn the left there was an 80% left SFA, 70% mid left SFA. There was a short segment calcified CTO distal less than SFA and above-knee popliteal artery reconstituting with two-vessel runoff. 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 It was felt that she had bilateral SFA disease as well as tibial vessel disease. An attempt was made to revascularize the left SFA but they were unable to cross because of the highly calcified nature of the lesion. 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 She has an area on the medial left fourth toe with raised horned callus on top of this. I am not certain what this represents. With regards to pain she has about a 2-1/2 I will claudication tolerance in the  grocery store. She has some pain in night which is relieved by putting her feet down over the side of the bed. She is wearing Nitro-Dur patches on the top of her right foot. Past medical history includes type 2 diabetes with secondary PAD, neoplasm of the skin,  diabetic retinopathy, carotid artery stenosis, hyperlipidemia hypertension. Patient History Information obtained from Patient. Allergies Demerol, scopolamine Family History Cancer - Mother, Diabetes - Maternal Grandparents, Hypertension - Mother, Stroke - Mother,Maternal Grandparents, No family history of Heart Disease, Hereditary Spherocytosis, Kidney Disease, Lung Disease, Seizures, Thyroid Problems, Tuberculosis. Social History Former smoker, Marital Status - Widowed, Alcohol Use - Never, Drug Use - No History, Caffeine Use - Daily. Medical History Eyes Denies history of Cataracts, Glaucoma, Optic Neuritis Ear/Nose/Mouth/Throat Denies history of Chronic sinus problems/congestion, Middle ear problems Hematologic/Lymphatic Denies history of Anemia, Hemophilia, Human Immunodeficiency Virus, Lymphedema, Sickle Cell Disease Respiratory Denies history of Aspiration, Asthma, Chronic Obstructive Pulmonary Disease (COPD), Pneumothorax, Sleep Apnea, Tuberculosis Cardiovascular Patient has history of Deep Vein Thrombosis, Hypertension, Peripheral Arterial Disease Denies history of Angina, Arrhythmia, Congestive Heart Failure, Coronary Artery Disease, Hypotension, Myocardial Infarction, Peripheral Venous Disease, Phlebitis, Vasculitis Gastrointestinal Denies history of Cirrhosis , Colitis, Crohnoos, Hepatitis A, Hepatitis B, Hepatitis C Endocrine Patient has history of Type II Diabetes Denies history of Type I Diabetes Genitourinary Denies history of End Stage Renal Disease Immunological Denies history of Lupus Erythematosus, Raynaudoos, Scleroderma Integumentary (Skin) Denies history of History of Burn Musculoskeletal Denies  history of Gout, Rheumatoid Arthritis, Osteoarthritis, Osteomyelitis Neurologic Denies history of Dementia, Neuropathy, Quadriplegia, Paraplegia, Seizure Disorder Oncologic Denies history of Received Chemotherapy, Received Radiation Psychiatric Denies history of Anorexia/bulimia, Confinement Anxiety Patient is treated with Oral Agents. Blood sugar is tested. Review of Systems (ROS) Constitutional Symptoms (General Health) Denies complaints or symptoms of Fatigue, Fever, Chills, Marked Weight Change. Eyes Denies complaints or symptoms of Dry Eyes, Vision Changes, Glasses / Contacts. Ear/Nose/Mouth/Throat Denies complaints or symptoms of Chronic sinus problems or rhinitis. Respiratory Denies complaints or symptoms of Chronic or frequent coughs, Shortness of Breath. Cardiovascular Denies complaints or symptoms of Chest pain. Gastrointestinal Denies complaints or symptoms of Frequent diarrhea, Nausea, Vomiting. Endocrine Denies complaints or symptoms of Heat/cold intolerance. Genitourinary Denies complaints or symptoms of Frequent urination. Integumentary (Skin) Complains or has symptoms of Wounds. Musculoskeletal Denies complaints or symptoms of Muscle Pain, Muscle Weakness. Neurologic Denies complaints or symptoms of Numbness/parasthesias. Psychiatric Denies complaints or symptoms of Claustrophobia, Suicidal. Objective Constitutional Patient is hypertensive.. Pulse regular and within target range for patient.Marland Kitchen Respirations regular, non-labored and within target range.. Temperature is normal and within the target range for the patient.Marland Kitchen Appears in no distress. Vitals Time Taken: 8:51 AM, Height: 64 in, Source: Stated, Weight: 142 lbs, Source: Stated, BMI: 24.4, Temperature: 97.9 F, Pulse: 90 bpm, Respiratory Rate: 18 breaths/min, Blood Pressure: 170/72 mmHg, Capillary Blood Glucose: 122 mg/dl. General Notes: CBG per patient Eyes Conjunctivae clear. No discharge.no  icterus. Respiratory work of breathing is normal. Bilateral breath sounds are clear and equal in all lobes with no wheezes, rales or rhonchi.. Cardiovascular Heart rhythm and rate regular, without murmur or gallop.. Not palpable on the right nor is the popliteal. The left femoral and left popliteal are palpable. Pedal pulses absent bilaterally.. Gastrointestinal (GI) Abdomen is soft and non-distended without masses or tenderness.. Lymphatic None palpable in the popliteal or inguinal areas. Neurological There is definitely some loss of light touch to the microfilament and vibration sense in the right greater than left foot suggestive of diabetic neuropathy. Psychiatric appears at normal baseline. General Notes: Wound exam ooOn the right she has mummified dry gangrene on the tip of the right first second and third toes. Ischemic spots on the fourth and fifth toes dorsally. There is no evidence of infection although I think the proximal part of  her toes is going to become similarly involved as they are dusky and erythematous. ooOn the left fourth toe the only lesion she has is a hyperkeratotic area dorsally. She states this has been there for a year I am not sure that this represents ischemic change at this point. Integumentary (Hair, Skin) No primary skin lesion is seen. Wound #1 status is Open. Original cause of wound was Blister. The wound is located on the Left,Dorsal Toe Fourth. The wound measures 1cm length x 0.9cm width x 0.1cm depth; 0.707cm^2 area and 0.071cm^3 volume. There is no tunneling or undermining noted. There is a small amount of serosanguineous drainage noted. The wound margin is flat and intact. There is no granulation within the wound bed. There is a large (67-100%) amount of necrotic tissue within the wound bed including Adherent Slough. Wound #2 status is Open. Original cause of wound was Blister. The wound is located on the Right,Circumferential Toe Great. The  wound measures 2.5cm length x 8cm width x 0.1cm depth; 15.708cm^2 area and 1.571cm^3 volume. There is no tunneling or undermining noted. There is a small amount of serosanguineous drainage noted. The wound margin is flat and intact. There is no granulation within the wound bed. There is a large (67-100%) amount of necrotic tissue within the wound bed including Eschar and Adherent Slough. Wound #3 status is Open. Original cause of wound was Blister. The wound is located on the Right,Circumferential Toe Second. The wound measures 2.5cm length x 6cm width x 0.1cm depth; 11.781cm^2 area and 1.178cm^3 volume. There is no tunneling or undermining noted. There is a small amount of serosanguineous drainage noted. The wound margin is flat and intact. There is no granulation within the wound bed. There is a large (67-100%) amount of necrotic tissue within the wound bed including Eschar and Adherent Slough. Wound #4 status is Open. Original cause of wound was Blister. The wound is located on the Right,Circumferential Toe Third. The wound measures 1cm length x 5cm width x 0.1cm depth; 3.927cm^2 area and 0.393cm^3 volume. There is no tunneling or undermining noted. There is a small amount of serosanguineous drainage noted. The wound margin is flat and intact. There is no granulation within the wound bed. There is a large (67-100%) amount of necrotic tissue within the wound bed including Eschar and Adherent Slough. Wound #5 status is Open. Original cause of wound was Blister. The wound is located on the Right Toe Fourth. The wound measures 0.3cm length x 0.3cm width x 0.1cm depth; 0.071cm^2 area and 0.007cm^3 volume. There is no tunneling or undermining noted. There is a small amount of serosanguineous drainage noted. The wound margin is flat and intact. There is no granulation within the wound bed. There is a large (67-100%) amount of necrotic tissue within the wound bed including Eschar and Adherent  Slough. Wound #6 status is Open. Original cause of wound was Blister. The wound is located on the Right Toe Fifth. The wound measures 0.4cm length x 0.3cm width x 0.1cm depth; 0.094cm^2 area and 0.009cm^3 volume. There is no tunneling or undermining noted. There is a none present amount of drainage noted. The wound margin is flat and intact. There is no granulation within the wound bed. There is a large (67-100%) amount of necrotic tissue within the wound bed including Eschar. 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 Dressing Change Frequency: Change dressing every day. - all wounds Wound Cleansing: May shower and wash wound with soap and water. Primary Wound Dressing: Wound #1 Left,Dorsal Toe Fourth: Medihoney gel Wound #2 Right,Circumferential Toe Great: Other: - Paint with betadine Wound #3 Right,Circumferential Toe Second: Other: - Paint with betadine Wound #4 Right,Circumferential Toe Third: Other: - Paint with betadine Wound #5 Right Toe Fourth: Other: - Paint with betadine Wound #6 Right Toe Fifth: Other: - Paint with betadine Secondary Dressing: Dry Gauze - to all wounds 1. I would continue with Betadine to the toes on the right foot. 2. I will communicate to Dr. Alvester Chou about what would be possible in terms of revascularization especially on the right 3. I am not sure that the left fourth toe lesion is an ischemic lesion although looking back at the pictures it certainly looks like it was at one point. We will use Medihoney in this area. Since she already has this. 4. The patient has  significant claudication even lying in bed at night. There is certainly a risk of amputation here. 5. 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 Electronic Signature(s) Signed: 07/13/2019 5:54:03 PM By: [name] MD Entered By: [name] on 07/13/2019 10:53:11 -------------------------------------------------------------------------------- HxROS Details Patient Name: Date of Service: [name] 07/13/2019 9:00 AM Medical Record Number:[id number] Patient Account Number: [id number] Date of Birth/Sex: Treating RN: 31-Jan-1941 (78 y.o. [name] Primary Care Provider: [name] Other Clinician: Referring Provider: Treating Provider/[name]:Askari Kinley, [name], [name] Weeks in Treatment: 0 Information Obtained From Patient Constitutional Symptoms (General Health) Complaints and Symptoms: Negative for: Fatigue; Fever; Chills; Marked Weight Change Eyes Complaints and Symptoms: Negative for: Dry Eyes; Vision Changes; Glasses / Contacts Medical History: Negative for: Cataracts; Glaucoma; Optic Neuritis Ear/Nose/Mouth/Throat Complaints and Symptoms: Negative for: Chronic sinus problems or rhinitis Medical History: Negative for: Chronic sinus problems/congestion; Middle ear problems Respiratory Complaints and Symptoms: Negative for: Chronic or frequent coughs; Shortness of Breath Medical History: Negative for: Aspiration; Asthma; Chronic Obstructive Pulmonary Disease (COPD); Pneumothorax; Sleep Apnea; Tuberculosis Cardiovascular Complaints and Symptoms: Negative for: Chest pain Medical History: Positive for: Deep Vein Thrombosis; Hypertension; Peripheral Arterial Disease Negative for: Angina; Arrhythmia; Congestive Heart Failure; Coronary Artery Disease; Hypotension; Myocardial Infarction; Peripheral Venous Disease; Phlebitis;  Vasculitis Gastrointestinal Complaints and Symptoms: Negative for: Frequent diarrhea; Nausea; Vomiting Medical History: Negative for: Cirrhosis ; Colitis; Crohns; Hepatitis A; Hepatitis B; Hepatitis C Endocrine Complaints and Symptoms: Negative for: Heat/cold intolerance Medical History: Positive for: Type II Diabetes Negative for: Type I Diabetes Time with diabetes: 31 Treated with: Oral agents Blood sugar tested every day: Yes Tested : daily Genitourinary Complaints and Symptoms: Negative for: Frequent urination Medical History: Negative for: End Stage Renal Disease Integumentary (Skin) Complaints and Symptoms: Positive for: Wounds Medical History: Negative for: History of Burn Musculoskeletal Complaints and Symptoms: Negative for: Muscle Pain; Muscle Weakness Medical History: Negative for: Gout; Rheumatoid Arthritis; Osteoarthritis; Osteomyelitis Neurologic Complaints and Symptoms: Negative for: Numbness/parasthesias Medical History: Negative for: Dementia; Neuropathy; Quadriplegia; Paraplegia; Seizure Disorder Psychiatric Complaints and Symptoms: Negative for: Claustrophobia; Suicidal Medical History: Negative for: Anorexia/bulimia; Confinement Anxiety Hematologic/Lymphatic Medical History: Negative for: Anemia; Hemophilia; Human Immunodeficiency Virus; Lymphedema; Sickle Cell Disease Immunological Medical History: Negative for: Lupus Erythematosus;  Raynauds; Scleroderma Oncologic Medical History: Negative for: Received Chemotherapy; Received Radiation Immunizations Pneumococcal Vaccine: Received Pneumococcal Vaccination: No Implantable Devices None Family and Social History Cancer: Yes - Mother; Diabetes: Yes - Maternal Grandparents; Heart Disease: No; Hereditary Spherocytosis: No; Hypertension: Yes - Mother; Kidney Disease: No; Lung Disease: No; Seizures: No; Stroke: Yes - Mother,Maternal Grandparents; Thyroid Problems: No; Tuberculosis: No; Former  smoker; Marital Status - Widowed; Alcohol Use: Never; Drug Use: No History; Caffeine Use: Daily Electronic Signature(s) Signed: 07/13/2019 5:54:03 PM By: [name] MD Signed: 09/01/2019 3:01:15 PM By: Carlene Coria RN Entered By: Carlene Coria on 07/13/2019 09:09:10 -------------------------------------------------------------------------------- SuperBill Details Patient Name: Date of Service: [name] 07/13/2019 Medical Record Number:[id number] Patient Account Number: [id number] Date of Birth/Sex: Treating RN: Jun 03, 1941 (78 y.o. 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 other specified severity L97.528 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ity E11.42 Type 2 diabetes mellitus with diabetic polyneuropathy Facility Procedures CPT4 Code: [id number] Description: [phone] - WOUND CARE VISIT-LEV 5 EST PT Modifier: Quantity: 1 Physician Procedures CPT4 Code Description: [id number] - WC PHYS LEVEL 4 - NEW PT ICD-10 Diagnosis Description E11.52 Type 2 diabetes mellitus with diabetic peripheral angiopa L97.518 Non-pressure chronic ulcer of other part of right foot wi L97.528 Non-pressure chronic  ulcer of other part of left foot wit E11.42 Type 2 diabetes mellitus with diabetic polyneuropathy Modifier: thy with gangren th other specifi h other specifie Quantity: 1 e ed severity d severity Electronic Signature(s) Signed: 07/14/2019 6:17:56 PM By: [name] MD Signed: 07/15/2019 6:50:57 PM By: [name] RN, BSN Previous Signature: 07/13/2019 5:54:03  PM Version By: [name] MD Entered By: [name] on 07/13/2019 18:19:46

## 2019-09-01 NOTE — Progress Notes (Signed)
[name], [name] ([id number]) Visit Report for 07/13/2019 Abuse/Suicide Risk Screen Details Patient Name: Date of Service: [name], [name] 07/13/2019 9:00 AM Medical Record Number:[id number] Patient Account Number: [id number] Date of Birth/Sex: Treating RN: 11-24-1940 (78 y.o. [name] Primary Care [name]: [name] Other Clinician: Referring Kadin Bera: Treating [name]/[name]:[name] in Treatment: 0 Abuse/Suicide Risk Screen Items Answer ABUSE RISK SCREEN: Has anyone close to you tried to hurt or harm you recentlyo No Do you feel uncomfortable with anyone in your familyo No Has anyone forced you do things that you didnt want to doo No Electronic Signature(s) Signed: 09/01/2019 3:01:15 PM By: Carlene Coria RN Entered By: Carlene Coria on 07/13/2019 09:09:22 -------------------------------------------------------------------------------- Activities of Daily Living Details Patient Name: Date of Service: [name] 07/13/2019 9:00 AM Medical Record Number:[id number] Patient Account Number: [id number] Date of Birth/Sex: Treating RN: Aug 26, 1941 (78 y.o. [name] Primary Care [name]: [name] Other Clinician: Referring Mychael Soots: Treating [name]/[name]:[name] in Treatment: 0 Activities of Daily Living Items Answer Activities of Daily Living (Please select one for each item) Drive Automobile Completely Able Take Medications Completely Able Use Telephone Completely Able Care for Appearance Completely Able Use Toilet Completely Able Bath / Shower Completely Able Dress Self Completely Able Feed Self Completely Able Walk Completely Able Get In / Out Bed Completely Able Housework Completely Able Prepare Meals Completely Able Handle Money Completely Able Shop for Self Completely Able Electronic Signature(s) Signed: 09/01/2019 3:01:15 PM By: Carlene Coria RN Entered By:  Carlene Coria on 07/13/2019 09:10:14 -------------------------------------------------------------------------------- Education Screening Details Patient Name: Date of Service: [name] 07/13/2019 9:00 AM Medical Record Number:[id number] Patient Account Number: [id number] Date of Birth/Sex: Treating RN: 02-15-1941 (78 y.o. [name] Primary Care [name]: [name] Other Clinician: Referring Jaydrian [name]: Treating [name]/[name]:[name], [name] in Treatment: 0 Primary Learner Assessed: Patient Learning Preferences/Education Level/Primary Language Learning Preference: Explanation Highest Education Level: College or Above Preferred Language: English Cognitive Barrier Language Barrier: No Translator Needed: No Memory Deficit: No Emotional Barrier: No Cultural/Religious Beliefs Affecting Medical Care: No Physical Barrier Impaired Vision: No Impaired Hearing: No Decreased Hand dexterity: No Knowledge/Comprehension Knowledge Level: High Comprehension Level: High Ability to understand written High instructions: Ability to understand verbal High instructions: Motivation Anxiety Level: Calm Cooperation: Cooperative Education Importance: Acknowledges Need Interest in Health Problems: Asks Questions Perception: Coherent Willingness to Engage in Self- High Management Activities: Readiness to Engage in Self- High Management Activities: Electronic Signature(s) Signed: 09/01/2019 3:01:15 PM By: Carlene Coria RN Entered By: Carlene Coria on 07/13/2019 09:10:56 -------------------------------------------------------------------------------- Fall Risk Assessment Details Patient Name: Date of Service: [name] 07/13/2019 9:00 AM Medical Record Number:[id number] Patient Account Number: [id number] Date of Birth/Sex: Treating RN: 1941/06/10 (78 y.o. [name] Primary Care [name]: [name] Other  Clinician: Referring [name]: Treating [name]/[name]:[name] in Treatment: 0 Fall Risk Assessment Items Have you had 2 or more falls in the last 12 monthso 0 No Have you had any fall that resulted in injury in the last 12 monthso 0 No FALLS RISK SCREEN History of falling - immediate or within 3 months 0 No Secondary diagnosis (Do you have 2 or more medical diagnoseso) 0 No Ambulatory aid None/bed rest/wheelchair/nurse 0 No Crutches/cane/walker 0 No Furniture 0 No Intravenous therapy Access/Saline/Heparin Lock 0 No Weak (short steps with or without shuffle, stooped but able to lift head 0 No while walking, may seek support from furniture) Impaired (short steps with shuffle, may have  difficulty arising from chair, 0 No head down, impaired balance) Mental Status Oriented to own ability 0 No Overestimates or forgets limitations 0 No Risk Level: Low Risk Score: 0 Electronic Signature(s) Signed: 09/01/2019 3:01:15 PM By: Carlene Coria RN Entered By: Carlene Coria on 07/13/2019 09:11:16 -------------------------------------------------------------------------------- Foot Assessment Details Patient Name: Date of Service: [name] 07/13/2019 9:00 AM Medical Record Number:[id number] Patient Account Number: [id number] Date of Birth/Sex: Treating RN: 02-06-1941 (78 y.o. [name] Primary Care [name]: [name] Other Clinician: Referring [name]: Treating [name]/[name]:[name] in Treatment: 0 Foot Assessment Items Site Locations + = Sensation present, - = Sensation absent, C = Callus, U = Ulcer R = Redness, W = Warmth, M = Maceration, PU = Pre-ulcerative lesion F = Fissure, S = Swelling, D = Dryness Assessment Right: Left: Other Deformity: No No Prior Foot Ulcer: No No Prior Amputation: No No Charcot Joint: No No Ambulatory Status: Ambulatory Without Help Gait: Steady Electronic  Signature(s) Signed: 09/01/2019 3:01:15 PM By: Carlene Coria RN Entered By: Carlene Coria on 07/13/2019 09:17:38 -------------------------------------------------------------------------------- Nutrition Risk Screening Details Patient Name: Date of Service: [name], [name] 07/13/2019 9:00 AM Medical Record Number:[id number] Patient Account Number: [id number] Date of Birth/Sex: Treating RN: November 08, 1940 (78 y.o. [name] Primary Care Ivette Castronova: [name] Other Clinician: Referring Shamikia Linskey: Treating Teran Knittle/[name]:[name] in Treatment: 0 Height (in): 64 Weight (lbs): 142 Body Mass Index (BMI): 24.4 Nutrition Risk Screening Items Score Screening NUTRITION RISK SCREEN: I have an illness or condition that made me change the kind and/or 0 No amount of food I eat I eat fewer than two meals per day 0 No I eat few fruits and vegetables, or milk products 0 No I have three or more drinks of beer, liquor or wine almost every day 0 No I have tooth or mouth problems that make it hard for me to eat 0 No I don't always have enough money to buy the food I need 0 No I eat alone most of the time 0 No I take three or more different prescribed or over-the-counter drugs a day 1 Yes 0 No Without wanting to, I have lost or gained 10 pounds in the last six months I am not always physically able to shop, cook and/or feed myself 0 No Nutrition Protocols Good Risk Protocol 0 No interventions needed Moderate Risk Protocol High Risk Proctocol Risk Level: Good Risk Score: 1 Electronic Signature(s) Signed: 09/01/2019 3:01:15 PM By: Carlene Coria RN Entered By: Carlene Coria on 07/13/2019 09:11:31

## 2019-09-01 NOTE — Patient Instructions (Signed)
Continue follow up at the wound care center. Please let me know if you have any questions or concerns. Hope you feel better!

## 2019-09-02 DIAGNOSIS — D649 Anemia, unspecified: Secondary | ICD-10-CM | POA: Diagnosis not present

## 2019-09-02 DIAGNOSIS — E785 Hyperlipidemia, unspecified: Secondary | ICD-10-CM | POA: Diagnosis not present

## 2019-09-02 DIAGNOSIS — Z4781 Encounter for orthopedic aftercare following surgical amputation: Secondary | ICD-10-CM | POA: Diagnosis not present

## 2019-09-02 DIAGNOSIS — E1152 Type 2 diabetes mellitus with diabetic peripheral angiopathy with gangrene: Secondary | ICD-10-CM | POA: Diagnosis not present

## 2019-09-02 DIAGNOSIS — I10 Essential (primary) hypertension: Secondary | ICD-10-CM | POA: Diagnosis not present

## 2019-09-02 DIAGNOSIS — K219 Gastro-esophageal reflux disease without esophagitis: Secondary | ICD-10-CM | POA: Diagnosis not present

## 2019-09-02 NOTE — Progress Notes (Signed)
[name], [name] ([id number]) Visit Report for 08/27/2019 Arrival Information Details Patient Name: Date of Service: [name], [name] 08/27/2019 1:45 PM Medical Record Number:[id number] Patient Account Number: [id number] Date of Birth/Sex: Treating RN: July 14, 1941 (78 y.o. [name] Primary Care Tiffony Kite: [name] Other Clinician: Sandre Kitty Referring Khaliah Barnick: Treating Shayanna Thatch/[name]:[name], [name], [name] Weeks in Treatment: 6 Visit Information History Since Last Visit Added or deleted any medications: No Patient Arrived: [name]: No Arrival Time: 14:48 Had a fall or experienced change in No Accompanied By: self activities of daily living that may affect Transfer Assistance: None risk of falls: Patient Identification Verified: Yes Signs or symptoms of abuse/neglect since last No Secondary Verification Process Yes visito Completed: Hospitalized since last visit: No Patient Requires Transmission- No Implantable device outside of the clinic excluding No Based Precautions: cellular tissue based products placed in the center Patient Has Alerts: Yes since last visit: Patient Alerts: Right ABI .17 TBI Has Dressing in Place as Prescribed: Yes refused Pain Present Now: No Left ABI .60 TBI .14 Electronic Signature(s) Signed: 09/02/2019 12:04:56 PM By: Kela Millin Entered By: Kela Millin on 08/27/2019 14:49:31 -------------------------------------------------------------------------------- Clinic Level of Care Assessment Details Patient Name: Date of Service: [name], [name] 08/27/2019 1:45 PM Medical Record Number:[id number] Patient Account Number: [id number] Date of Birth/Sex: Treating RN: [birth year] (78 y.o. [name], [name] Primary Care Blayden Conwell: [name] Other Clinician: Sandre Kitty Referring Annai Heick: Treating Mayson Sterbenz/[name]:[name], [name],  [name] Weeks in Treatment: 6 Clinic Level of Care Assessment Items TOOL 4 Quantity Score X - Use when only an EandM is performed on FOLLOW-UP visit 1 0 ASSESSMENTS - Nursing Assessment / Reassessment X - Reassessment of Co-morbidities (includes updates in patient status) 1 10 X - Reassessment of Adherence to Treatment Plan 1 5 ASSESSMENTS - Wound and Skin Assessment / Reassessment []  - Simple Wound Assessment / Reassessment - one wound 0 X - Complex Wound Assessment / Reassessment - multiple wounds 2 5 X - Dermatologic / Skin Assessment (not related to wound area) 1 10 ASSESSMENTS - Focused Assessment X - Circumferential Edema Measurements - multi extremities 2 5 X - Nutritional Assessment / Counseling / Intervention 1 10 []  - Lower Extremity Assessment (monofilament, tuning fork, pulses) 0 []  - Peripheral Arterial Disease Assessment (using hand held doppler) 0 ASSESSMENTS - Ostomy and/or Continence Assessment and Care []  - Incontinence Assessment and Management 0 []  - Ostomy Care Assessment and Management (repouching, etc.) 0 PROCESS - Coordination of Care []  - Simple Patient / Family Education for ongoing care 0 X - Complex (extensive) Patient / Family Education for ongoing care 1 20 X - Staff obtains Programmer, systems, Records, Test Results / Process Orders 1 10 X - Staff telephones HHA, Nursing Homes / Clarify orders / etc 1 10 []  - Routine Transfer to another Facility (non-emergent condition) 0 []  - Routine Hospital Admission (non-emergent condition) 0 []  - New Admissions / Biomedical engineer / Ordering NPWT, Apligraf, etc. 0 []  - Emergency Hospital Admission (emergent condition) 0 []  - Simple Discharge Coordination 0 X - Complex (extensive) Discharge Coordination 1 15 PROCESS - Special Needs []  - Pediatric / Minor Patient Management 0 []  - Isolation Patient Management 0 []  - Hearing / Language / Visual special needs 0 []  - Assessment of Community assistance (transportation, D/C  planning, etc.) 0 []  - Additional assistance / Altered mentation 0 []  - Support Surface(s) Assessment (bed, cushion, seat, etc.) 0 INTERVENTIONS - Wound Cleansing / Measurement []  - Simple Wound  Cleansing - one wound 0 X - Complex Wound Cleansing - multiple wounds 2 5 X - Wound Imaging (photographs - any number of wounds) 1 5 []  - Wound Tracing (instead of photographs) 0 []  - Simple Wound Measurement - one wound 0 X - Complex Wound Measurement - multiple wounds 2 5 INTERVENTIONS - Wound Dressings X - Small Wound Dressing one or multiple wounds 1 10 X - Medium Wound Dressing one or multiple wounds 1 15 []  - Large Wound Dressing one or multiple wounds 0 []  - Application of Medications - topical 0 []  - Application of Medications - injection 0 INTERVENTIONS - Miscellaneous []  - External ear exam 0 []  - Specimen Collection (cultures, biopsies, blood, body fluids, etc.) 0 []  - Specimen(s) / Culture(s) sent or taken to Lab for analysis 0 []  - Patient Transfer (multiple staff / Civil Service fast streamer / Similar devices) 0 []  - Simple Staple / Suture removal (25 or less) 0 []  - Complex Staple / Suture removal (26 or more) 0 []  - Hypo / Hyperglycemic Management (close monitor of Blood Glucose) 0 []  - Ankle / Brachial Index (ABI) - do not check if billed separately 0 X - Vital Signs 1 5 Has the patient been seen at the hospital within the last three years: Yes Total Score: 165 Level Of Care: New/Established - Level 5 Electronic Signature(s) Signed: 08/27/2019 6:37:14 PM By: Deon Pilling Entered By: Deon Pilling on 08/27/2019 15:32:33 -------------------------------------------------------------------------------- Encounter Discharge Information Details Patient Name: Date of Service: [name] 08/27/2019 1:45 PM Medical Record Number:[id number] Patient Account Number: [id number] Date of Birth/Sex: Treating RN: 09/16/1941 (78 y.o. [name] Primary Care Johaan Ryser: [name] Other  Clinician: Sandre Kitty Referring Aarianna Hoadley: Treating Amelianna Meller/[name]:[name], [name], [name] in Treatment: 6 Encounter Discharge Information Items Discharge Condition: Stable Ambulatory Status: Walker Discharge Destination: Home Transportation: Private Auto Accompanied By: daughter Schedule Follow-up Appointment: Yes Clinical Summary of Care: Patient Declined Electronic Signature(s) Signed: 08/27/2019 5:56:19 PM By: Baruch Gouty RN, BSN Entered By: Baruch Gouty on 08/27/2019 15:56:29 -------------------------------------------------------------------------------- Lower Extremity Assessment Details Patient Name: Date of Service: [name], [name] 08/27/2019 1:45 PM Medical Record Number:[id number] Patient Account Number: [id number] Date of Birth/Sex: Treating RN: Jul 29, 1941 (78 y.o. [name] Primary Care Joylynn Defrancesco: [name] Other Clinician: Sandre Kitty Referring Tamir Wallman: Treating Clarke Peretz/[name]:[name], [name], [name] Weeks in Treatment: 6 Edema Assessment Assessed: [Left: No] [Right: No] Edema: [Left: No] [Right: Yes] Calf Left: Right: Point of Measurement: 39 cm From Medial Instep 28 cm 27 cm Ankle Left: Right: Point of Measurement: 10 cm From Medial Instep 21 cm 19.5 cm Vascular Assessment Pulses: Dorsalis Pedis Palpable: [Left:Yes] [Right:Yes] Electronic Signature(s) Signed: 09/02/2019 12:04:56 PM By: Kela Millin Entered By: Kela Millin on 08/27/2019 14:58:43 -------------------------------------------------------------------------------- Multi Wound Chart Details Patient Name: Date of Service: [name] 08/27/2019 1:45 PM Medical Record Number:[id number] Patient Account Number: [id number] Date of Birth/Sex: Treating RN: 03-Feb-1941 (78 y.o. [name] Primary Care Glennice Marcos: [name] Other Clinician: Sandre Kitty Referring Kehinde Bowdish: Treating  Colin Norment/[name]:[name], [name], [name] Weeks in Treatment: 6 Vital Signs Height(in): 64 Capillary Blood 122 Glucose(mg/dl): Weight(lbs): 142 Pulse(bpm): 15 Body Mass Index(BMI): 24 Blood Pressure(mmHg): 127/36 Temperature(F): 97.8 Respiratory 18 Rate(breaths/min): Photos: [1:No Photos] [2:No Photos] [3:No Photos] Wound Location: [1:Left Toe Fourth - Dorsal] [2:Right Toe Great - Circumferential] [3:Right Toe Second - Circumferential] Wounding Event: [1:Blister] [2:Blister] [3:Blister] 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] [2:Lower Extremity] [3:Lower Extremity] 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] Comorbid History: [1:Deep Vein Thrombosis, Hypertension, Peripheral Hypertension, Peripheral Hypertension, Peripheral Arterial Disease, Type II Diabetes] [2:Deep Vein Thrombosis, Arterial Disease, Type II Diabetes] [3:Deep Vein Thrombosis, Arterial  Disease, Type II Diabetes] Date Acquired: [1:09/16/2018] [2:06/15/2019] [3:06/15/2019] Weeks of Treatment: [1:6] [2:6] [3:6] Wound Status: [1:Open] [2:Amputation] [3:Amputation] Measurements L x W x D 0.7x0.8x0.4 [2:0x0x0] [3:0x0x0] (cm) Area (cm) : [1:0.44] [2:0] [3:0] Volume (cm) : [1:0.176] [2:0] [3:0] % Reduction in Area: [1:37.80%] [2:100.00%] [3:100.00%] % Reduction in Volume: -147.90% [2:100.00%] [3:100.00%] Classification: [1:Grade 2] [2:Grade 4] [3:Grade 4] Exudate Amount: [1:Small] [2:None Present] [3:None Present] Exudate Type: [1:Purulent] [2:N/A] [3:N/A] Exudate Color: [1:yellow, brown, green] [2:N/A] [3:N/A] Wound Margin: [1:Distinct, outline attached Flat and Intact] [3:Flat and Intact] Granulation Amount: [1:Small (1-33%)] [2:None Present (0%)] [3:None Present (0%)] Granulation Quality: [1:Pink] [2:N/A] [3:N/A] Necrotic Amount: [1:Large (67-100%)] [2:None  Present (0%)] [3:None Present (0%)] Necrotic Tissue: [1:Eschar, Adherent Slough] [2:N/A] [3:N/A] Exposed Structures: [1:Fat Layer (Subcutaneous Tissue) Exposed: Yes Fascia: No Tendon: No Muscle: No Joint: No Bone: No] [2:Fascia: No Fat Layer (Subcutaneous Tissue) Exposed: No Tendon: No Muscle: No Joint: No Bone: No] [3:Fascia: No Fat Layer (Subcutaneous Tissue)  Exposed: No Tendon: No Muscle: No Joint: No Bone: No] Epithelialization: [1:None] [2:None] [3:None] Assessment Notes: [1:N/A 4] [2:N/A] [3:N/A 5] Photos: [1:No Photos] [2:No Photos] [3:No Photos] Wound Location: [1:Right Toe Third - Circumferential] [2:Right Toe Fourth] [3:Right Toe Fifth] Wounding Event: [1:Blister] [2:Blister] [3:Blister] 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] [2:Lower Extremity] [3:Lower Extremity] 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] Comorbid History: [1:Deep Vein Thrombosis, Hypertension, Peripheral Hypertension, Peripheral Hypertension, Peripheral Arterial Disease, Type II Diabetes] [2:Deep Vein Thrombosis, Arterial Disease, Type II Diabetes] [3:Deep Vein Thrombosis, Arterial  Disease, Type II Diabetes] Date Acquired: [1:06/15/2019] [2:06/15/2019] [3:06/15/2019] Weeks of Treatment: [1:6] [2:6] [3:6] Wound Status: [1:Amputation] [2:Amputation] [3:Amputation] Measurements L x W x D 0x0x0 [2:0x0x0] [3:0x0x0] (cm) Area (cm) : [1:0] [2:0] [3:0] Volume (cm) : [1:0] [2:0] [3:0] % Reduction in Area: [1:100.00%] [2:100.00%] [3:100.00%] % Reduction in Volume: 100.00% [2:100.00%] [3:100.00%] Classification: [1:Grade 4] [2:Grade 2] [3:Grade 2] Exudate Amount: [1:None Present] [2:None Present] [3:None Present] Exudate Type: [1:N/A] [2:N/A] [3:N/A] Exudate Color: [1:N/A] [2:N/A] [3:N/A] Wound Margin: [1:Flat and Intact] [2:Flat and Intact] [3:Flat and  Intact] Granulation Amount: [1:None Present (0%)] [2:None Present (0%)] [3:None Present (0%)] Granulation Quality: [1:N/A] [2:N/A] [3:N/A] Necrotic Amount: [1:None Present (0%)] [2:None Present (0%)] [3:None Present (0%)] Necrotic Tissue: [1:N/A] [2:N/A] [3:N/A] Exposed Structures: [1:Fascia: No Fat Layer (Subcutaneous Fat Layer (Subcutaneous Fat Layer (Subcutaneous Tissue) Exposed: No Tendon: No Muscle: No Joint: No Bone: No] [2:Fascia: No Tissue) Exposed: No Tendon: No Muscle: No Joint: No Bone: No] [3:Fascia: No Tissue) Exposed:  No Tendon: No Muscle: No Joint: No Bone: No] Epithelialization: [1:None] [2:None] [3:None] Assessment Notes: [1:N/A 7] [2:N/A N/A] [3:N/A N/A] Photos: [1:No Photos] [2:N/A] [3:N/A] Wound Location: [1:Right Amputation Site - Transmetatarsal] [2:N/A] [3:N/A] Wounding Event: [1:Surgical Injury] [2:N/A] [3:N/A] Primary Etiology: [1:Diabetic Wound/Ulcer of the N/A Lower Extremity] [3:N/A] Secondary Etiology: [1:N/A] [2:N/A] [3:N/A] Comorbid History: [1:Deep Vein Thrombosis, Hypertension, Peripheral Arterial Disease, Type II Diabetes] [2:N/A] [3:N/A] Date Acquired: [1:08/07/2019] [2:N/A] [3:N/A] Weeks of Treatment: [1:0] [2:N/A] [3:N/A] Wound Status: [1:Open] [2:N/A] [3:N/A] Measurements L x W x D 1.2x11x0.1 [2:N/A] [3:N/A] (cm) Area (cm) : [1:10.367] [2:N/A] [3:N/A] Volume (cm) : [1:1.037] [2:N/A] [3:N/A] % Reduction in Area: [1:N/A] [2:N/A] [3:N/A] % Reduction in Volume: N/A [2:N/A] [3:N/A] Classification: [  1:Grade 2] [2:N/A] [3:N/A] Exudate Amount: [1:Medium] [2:N/A] [3:N/A] Exudate Type: [1:Serous] [2:N/A] [3:N/A] Exudate Color: [1:amber] [2:N/A] [3:N/A] Wound Margin: [1:Distinct, outline attached] [2:N/A] [3:N/A] Granulation Amount: [1:Medium (34-66%)] [2:N/A] [3:N/A] Granulation Quality: [1:Pink] [2:N/A] [3:N/A] Necrotic Amount: [1:Medium (34-66%)] [2:N/A] [3:N/A] Necrotic Tissue: [1:Eschar, Adherent Slough] [2:N/A] [3:N/A] Exposed Structures: [1:Fat  Layer (Subcutaneous Tissue) Exposed: Yes Fascia: No Tendon: No Muscle: No Joint: No Bone: No] [2:N/A] [3:N/A] Epithelialization: [1:None 10 sutures and 14 staples] [2:N/A N/A] [3:N/A N/A] Treatment Notes Wound #1 (Left, Dorsal Toe Fourth) 3. Primary Dressing Applied Iodoflex 4. Secondary Dressing Dry Gauze Roll Gauze 5. 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 Primary Dressing Applied Calcium Alginate Ag 4. Secondary Dressing ABD Pad Dry Gauze Roll Gauze 5. Secured With Elastic bandage 7. Footwear/Offloading device applied Removable Cast Scientific laboratory technician) Signed: 08/27/2019 6:37:14 PM By: Deon Pilling Signed: 08/28/2019 7:57:15 AM By: [name] MD Entered By: [name] on 08/27/2019 16:08:01 -------------------------------------------------------------------------------- Multi-Disciplinary Care Plan Details Patient Name: Date of Service: LIN, HACKMANN 08/27/2019 1:45 PM Medical Record Number:[id number] Patient Account Number: [id number] Date of Birth/Sex: Treating RN: Mar 09, 1941 (78 y.o. [name], [name] Primary Care Dequann Vandervelden: [name] Other Clinician: Sandre Kitty Referring Tobey Schmelzle: Treating Tannia Contino/[name]:[name], [name], [name] Weeks in Treatment: 6 Active Inactive Abuse / Safety / Falls / Self Care Management Nursing Diagnoses: Potential for falls Goals: Patient will remain injury free related to falls Date Initiated: 08/27/2019 Target Resolution Date: 10/02/2019 Goal Status: Active Interventions: Provide education on fall prevention Notes: Pain, Acute or Chronic Nursing Diagnoses: Pain, acute or chronic: actual or potential Potential alteration in comfort, pain Goals: Patient will verbalize adequate pain control and receive pain control interventions during procedures as needed Date Initiated: 08/27/2019 Target Resolution Date: 10/02/2019 Goal Status:  Active Interventions: Encourage patient to take pain medications as prescribed Provide education on pain management Reposition patient for comfort Notes: Electronic Signature(s) Signed: 08/27/2019 6:37:14 PM By: Deon Pilling Entered By: Deon Pilling on 08/27/2019 14:58:33 -------------------------------------------------------------------------------- Pain Assessment Details Patient Name: Date of Service: [name], [name] 08/27/2019 1:45 PM Medical Record Number:[id number] Patient Account Number: [id number] Date of Birth/Sex: Treating RN: 1941/03/21 (78 y.o. [name] Primary Care Prashant Glosser: [name] Other Clinician: Sandre Kitty Referring Ariana Cavenaugh: Treating Kamdon Reisig/[name]:[name], [name], [name] Weeks in Treatment: 6 Active Problems Location of Pain Severity and Description of Pain Patient Has Paino No Site Locations Pain Management and Medication Current Pain Management: Electronic Signature(s) Signed: 09/02/2019 12:04:56 PM By: Kela Millin Entered By: Kela Millin on 08/27/2019 14:57:30 -------------------------------------------------------------------------------- Patient/Caregiver Education Details Patient Name: Date of Service: [name] 12/3/2020andnbsp1:45 PM Medical Record Number:[id number] Patient Account Number: [id number] Date of Birth/Gender: Treating RN: March 06, 1941 (78 y.o. [name] Primary Care Physician: [name] Other Clinician: Sandre Kitty Referring Physician: Treating Physician/[name]:[name], [name], [name] in Treatment: 6 Education Assessment Education Provided To: Patient Education Topics Provided Pain: Handouts: A Guide to Pain Control Methods: Explain/Verbal Responses: Reinforcements needed Electronic Signature(s) Signed: 08/27/2019 6:37:14 PM By: Deon Pilling Entered By: Deon Pilling on 08/27/2019  14:58:46 -------------------------------------------------------------------------------- Wound Assessment Details Patient Name: Date of Service: RAGUEL, KOSLOSKI 08/27/2019 1:45 PM Medical Record Number:[id number] Patient Account Number: [id number] Date of Birth/Sex: Treating RN: 01-Nov-1940 (78 y.o. [name] Primary Care Lauriana Denes: [name] Other Clinician: Sandre Kitty Referring Emrey Thornley: Treating Eviana Sibilia/[name]:[name], [name], [name] Weeks in Treatment: 6 Wound Status Wound Number: 1 Primary Diabetic Wound/Ulcer of the Lower Extremity Etiology: Wound Location: Left Toe Fourth - Dorsal Secondary Arterial Insufficiency  Ulcer Wounding Event: Blister Etiology: Date Acquired: 09/16/2018 Wound Open Weeks Of Treatment: 6 Status: Clustered Wound: No Comorbid Deep Vein Thrombosis, Hypertension, History: Peripheral Arterial Disease, Type II Diabetes Photos Wound Measurements Length: (cm) 0.7 Width: (cm) 0.8 Depth: (cm) 0.4 Area: (cm) 0.44 Volume: (cm) 0.176 Wound Description Classification: Grade 2 Wound Margin: Distinct, outline attached Exudate Amount: Small Exudate Type: Purulent Exudate Color: yellow, brown, green Wound Bed Granulation Amount: Small (1-33%) Granulation Quality: Pink Necrotic Amount: Large (67-100%) Necrotic Quality: Eschar, Adherent Slough r After Cleansing: No brino Yes Exposed Structure posed: No (Subcutaneous Tissue) Exposed: Yes posed: No posed: No osed: No sed: No % Reduction in Area: 37.8% % Reduction in Volume: -147.9% Epithelialization: None Tunneling: No Undermining: No Foul Odo Slough/Fi Fascia Ex Fat Layer Tendon Ex Muscle Ex Joint Exp Bone Expo Treatment Notes Wound #1 (Left, Dorsal Toe Fourth) 3. Primary Dressing Applied Iodoflex 4. Secondary Dressing Dry Gauze Roll Gauze 5. Secured With Recruitment consultant) Signed: 09/01/2019 4:34:42 PM By: [name]  EMT/HBOT Signed: 09/02/2019 12:04:56 PM By: Kela Millin Entered By: [name] on 08/31/2019 10:39:10 -------------------------------------------------------------------------------- Wound Assessment Details Patient Name: Date of Service: [name] 08/27/2019 1:45 PM Medical Record Number:[id number] Patient Account Number: [id number] Date of Birth/Sex: Treating RN: Nov 30, 1940 (78 y.o. Hollie Salk, Shannon Primary Care Shere Eisenhart: [name] Other Clinician: Sandre Kitty Referring Croy Drumwright: Treating Clyde Upshaw/[name]:[name], [name], [name] Weeks in Treatment: 6 Wound Status Wound Number: 2 Primary Diabetic Wound/Ulcer of the Lower Extremity Etiology: Wound Location: Right Toe Great - Circumferential Secondary Arterial Insufficiency Ulcer Wounding Event: Blister Etiology: Date Acquired: 06/15/2019 Wound Amputation Weeks Of Treatment: 6 Status: Clustered Wound: No Outcome Transmetatarsal Level: Comorbid Deep Vein Thrombosis, Hypertension, History: Peripheral Arterial Disease, Type II Diabetes Wound Measurements Length: (cm) 0 % Reducti Width: (cm) 0 % Reduct Depth: (cm) 0 Epitheli Area: (cm) 0 Tunneli Volume: (cm) 0 Undermi Wound Description Classification: Grade 4 Wound Margin: Flat and Intact Exudate Amount: None Present Wound Bed Granulation Amount: None Present (0%) Necrotic Amount: None Present (0%) Foul Odor After Cleansing: No Slough/Fibrino No Exposed Structure Fascia Exposed: No Fat Layer (Subcutaneous Tissue) Exposed: No Tendon Exposed: No Muscle Exposed: No Joint Exposed: No Bone Exposed: No on in Area: 100% ion in Volume: 100% alization: None ng: No ning: No Electronic Signature(s) Signed: 09/02/2019 12:04:56 PM By: Kela Millin Entered By: Kela Millin on 08/27/2019 15:02:40 -------------------------------------------------------------------------------- Wound Assessment Details Patient Name: Date  of Service: [name], [name] 08/27/2019 1:45 PM Medical Record Number:[id number] Patient Account Number: [id number] Date of Birth/Sex: Treating RN: 11-16-1940 (78 y.o. Hollie Salk, Shannon Primary Care Anne Boltz: [name] Other Clinician: Sandre Kitty Referring Nafeesa Dils: Treating Daurice Ovando/[name]:[name], [name], [name] Weeks in Treatment: 6 Wound Status Wound Number: 3 Primary Diabetic Wound/Ulcer of the Lower Extremity Etiology: Wound Location: Right Toe Second - Circumferential Secondary Arterial Insufficiency Ulcer Wounding Event: Blister Etiology: Date Acquired: 06/15/2019 Wound Amputation Weeks Of Treatment: 6 Status: Clustered Wound: No Outcome Transmetatarsal Level: Comorbid Deep Vein Thrombosis, Hypertension, History: Peripheral Arterial Disease, Type II Diabetes Wound Measurements Length: (cm) 0 % Reduct Width: (cm) 0 % Reduct Depth: (cm) 0 Epitheli Area: (cm) 0 Tunneli Volume: (cm) 0 Undermi Wound Description Classification: Grade 4 Wound Margin: Flat and Intact Exudate Amount: None Present Wound Bed Granulation Amount: None Present (0%) Necrotic Amount: None Present (0%) Foul Odor After Cleansing: No Slough/Fibrino No Exposed Structure Fascia Exposed: No Fat Layer (Subcutaneous Tissue) Exposed: No Tendon Exposed: No Muscle Exposed: No Joint Exposed: No Bone Exposed: No ion in  Area: 100% ion in Volume: 100% alization: None ng: No ning: No Electronic Signature(s) Signed: 09/02/2019 12:04:56 PM By: Kela Millin Entered By: Kela Millin on 08/27/2019 15:02:56 -------------------------------------------------------------------------------- Wound Assessment Details Patient Name: Date of Service: [name], [name] 08/27/2019 1:45 PM Medical Record Number:[id number] Patient Account Number: [id number] Date of Birth/Sex: Treating RN: [birth year] (78 y.o. Hollie Salk, Shannon Primary Care Shareena Nusz: [name] Other  Clinician: Sandre Kitty Referring Joel Mericle: Treating Randie Tallarico/[name]:[name], [name], [name] Weeks in Treatment: 6 Wound Status Wound Number: 4 Primary Diabetic Wound/Ulcer of the Lower Extremity Etiology: Wound Location: Right Toe Third - Circumferential Secondary Arterial Insufficiency Ulcer Wounding Event: Blister Etiology: Date Acquired: 06/15/2019 Wound Amputation Weeks Of Treatment: 6 Status: Clustered Wound: No Outcome Transmetatarsal Level: Comorbid Deep Vein Thrombosis, Hypertension, History: Peripheral Arterial Disease, Type II Diabetes Wound Measurements Length: (cm) 0 % Reducti Width: (cm) 0 % Reducti Depth: (cm) 0 Epithelia Area: (cm) 0 Tunnelin Volume: (cm) 0 Undermin Wound Description Classification: Grade 4 Foul Odor Wound Margin: Flat and Intact Slough/Fi Exudate Amount: None Present Wound Bed Granulation Amount: None Present (0%) Necrotic Amount: None Present (0%) Fascia Expo Fat Layer ( Tendon Expo Muscle Expo Joint Expos Bone Expose After Cleansing: No brino No Exposed Structure sed: No Subcutaneous Tissue) Exposed: No sed: No sed: No ed: No d: No on in Area: 100% on in Volume: 100% lization: None g: No ing: No Electronic Signature(s) Signed: 09/02/2019 12:04:56 PM By: Kela Millin Entered By: Kela Millin on 08/27/2019 15:03:10 -------------------------------------------------------------------------------- Wound Assessment Details Patient Name: Date of Service: [name], [name] 08/27/2019 1:45 PM Medical Record Number:[id number] Patient Account Number: [id number] Date of Birth/Sex: Treating RN: [birth year] (78 y.o. 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 Event: Blister Etiology: Date Acquired: 06/15/2019 Wound Amputation Weeks Of Treatment: 6 Status: Clustered Wound: No Outcome Transmetatarsal Level: Comorbid Deep Vein Thrombosis, Hypertension, History: Peripheral Arterial Disease, Type II Diabetes Wound Measurements Length: (cm) 0 % Reduction Width: (cm) 0 % Reduction Depth: (cm) 0 Epitheliali Area: (cm) 0 Tunneling: Volume: (cm) 0 Underminin Wound Description Classification: Grade 2 Foul Odor A Wound Margin: Flat and Intact Slough/Fibr Exudate Amount: None Present Wound Bed Granulation Amount: None Present (0%) Necrotic Amount: None Present (0%) Fascia Expo Fat Layer ( Tendon Expo Muscle Expo Joint Expos Bone Expose Electronic Signature(s) Signed: 09/02/2019 12:04:56 PM By: Kela Millin Entered By: Kela Millin on 12/0 fter Cleansing: No ino No Exposed Structure sed: No Subcutaneous Tissue) Exposed: No sed: No sed: No ed: No d: No 11/2018 15:03:23 in Area: 100% in Volume: 100% zation: None No g: No -------------------------------------------------------------------------------- Wound Assessment Details Patient Name: Date of Service: [name], [name] 08/27/2019 1:45 PM Medical Record Number:[id number] Patient Account Number: [id number] Date of Birth/Sex: Treating RN: Jun 03, 1941 (78 y.o. 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 Etiology: Date Acquired: 06/15/2019 Wound Amputation Weeks Of Treatment: 6 Status: Clustered Wound: No Outcome  Transmetatarsal Level: Comorbid Deep Vein Thrombosis, Hypertension, History: Peripheral Arterial Disease, Type II Diabetes Wound Measurements Length: (cm) 0 % Reduction Width: (cm) 0 % Reduction Depth: (  cm) 0 Epitheliali Area: (cm) 0 Tunneling: Volume: (cm) 0 Underminin Wound Description Classification: Grade 2 Foul Odor Wound Margin: Flat and Intact Slough/Fib Exudate Amount: None Present Wound Bed Granulation Amount: None Present (0%) Necrotic Amount: None Present (0%) Fascia Exp Fat Layer Tendon Exp Muscle Exp Joint Expo Bone Expos After Cleansing: No rino No Exposed Structure osed: No (Subcutaneous Tissue) Exposed: No osed: No osed: No sed: No ed: No in Area: 100% in Volume: 100% zation: None No g: No Electronic Signature(s) Signed: 09/02/2019 12:04:56 PM By: Kela Millin Entered By: Kela Millin on 08/27/2019 15:03:35 -------------------------------------------------------------------------------- Wound Assessment Details Patient Name: Date of Service: [name] 08/27/2019 1:45 PM Medical Record Number:[id number] Patient Account Number: [id number] Date of Birth/Sex: Treating RN: [birth year] (78 y.o. Hollie Salk, Shannon Primary Care Maquita Sandoval: [name] Other Clinician: Sandre Kitty Referring Harshitha Fretz: Treating Gethsemane Fischler/[name]:[name], [name], [name] Weeks in Treatment: 6 Wound Status Wound Number: 7 Primary Diabetic Wound/Ulcer of the Lower Extremity Etiology: Wound Location: Right Amputation Site - Transmetatarsal Wound Open Status: Wounding Event: Surgical Injury Comorbid Deep Vein Thrombosis, Hypertension, Date Acquired: 08/07/2019 History: Peripheral Arterial Disease, Type II Diabetes Weeks Of Treatment: 0 Clustered Wound: No Photos Wound Measurements Length: (cm) 1.2 % Reduction Width: (cm) 11 % Reduction Depth: (cm) 0.1 Epitheliali Area: (cm) 10.367 Tunneling: Volume: (cm) 1.037  Underminin Wound Description Classification: Grade 2 Foul Odor A Wound Margin: Distinct, outline attached Slough/Fibr Exudate Amount: Medium Exudate Type: Serous Exudate Color: amber Wound Bed Granulation Amount: Medium (34-66%) Granulation Quality: Pink Fascia Expo Necrotic Amount: Medium (34-66%) Fat Layer ( Necrotic Quality: Eschar, Adherent Slough Tendon Expo Muscle Expo Joint Expos Bone Expose fter Cleansing: No ino Yes Exposed Structure sed: No Subcutaneous Tissue) Exposed: Yes sed: No sed: No ed: No d: No in Area: 0% in Volume: 0% zation: None No g: No Assessment Notes 10 sutures and 14 staples Treatment Notes 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 Primary Dressing Applied Calcium Alginate Ag 4. Secondary Dressing ABD Pad Dry Gauze Roll Gauze 5. Secured With Elastic bandage 7. Footwear/Offloading device applied Removable Cast Scientific laboratory technician) Signed: 09/01/2019 4:34:42 PM By: [name] EMT/HBOT Signed: 09/02/2019 12:04:56 PM By: Kela Millin Entered By: [name] on 08/31/2019 10:40:16 -------------------------------------------------------------------------------- Vitals Details Patient Name: Date of Service: [name] 08/27/2019 1:45 PM Medical Record Number:[id number] Patient Account Number: [id number] Date of Birth/Sex: Treating RN: [birth year] (78 y.o. Hollie Salk, Shannon Primary Care Lanya Bucks: [name] Other Clinician: Sandre Kitty Referring Kayston Jodoin: Treating Beth Goodlin/[name]:[name], [name], [name] Weeks in Treatment: 6 Vital Signs Time Taken: 14:53 Temperature (F): 97.8 Height (in): 64 Pulse (bpm): 74 Weight (lbs): 142 Respiratory Rate (breaths/min): 18 Body Mass Index (BMI): 24.4 Blood Pressure (mmHg): 127/36 Capillary Blood Glucose (mg/dl): 122 Reference Range: 80 - 120 mg / dl Notes patient reported last CBG of 122 Electronic  Signature(s) Signed: 09/02/2019 12:04:56 PM By: Kela Millin Entered By: Kela Millin on 08/27/2019 14:57:23

## 2019-09-03 ENCOUNTER — Other Ambulatory Visit (HOSPITAL_COMMUNITY)
Admission: RE | Admit: 2019-09-03 | Discharge: 2019-09-03 | Disposition: A | Discharge status: Home / Self Care | Payer: Medicare Other | Source: Other Acute Inpatient Hospital | Attending: Internal Medicine | Admitting: Internal Medicine

## 2019-09-03 ENCOUNTER — Encounter (HOSPITAL_BASED_OUTPATIENT_CLINIC_OR_DEPARTMENT_OTHER): Payer: Medicare Other | Admitting: Internal Medicine

## 2019-09-03 ENCOUNTER — Other Ambulatory Visit: Payer: Self-pay

## 2019-09-03 DIAGNOSIS — E11621 Type 2 diabetes mellitus with foot ulcer: Secondary | ICD-10-CM | POA: Diagnosis not present

## 2019-09-03 DIAGNOSIS — T8189XA Other complications of procedures, not elsewhere classified, initial encounter: Secondary | ICD-10-CM | POA: Diagnosis not present

## 2019-09-03 DIAGNOSIS — Z89421 Acquired absence of other right toe(s): Secondary | ICD-10-CM | POA: Diagnosis not present

## 2019-09-03 DIAGNOSIS — L97518 Non-pressure chronic ulcer of other part of right foot with other specified severity: Secondary | ICD-10-CM | POA: Diagnosis not present

## 2019-09-03 DIAGNOSIS — L97528 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ity: Secondary | ICD-10-CM | POA: Diagnosis not present

## 2019-09-03 DIAGNOSIS — L97526 Non-pressure chronic ulcer of other part of left foot with bone involvement without evidence of necrosis: Secondary | ICD-10-CM | POA: Diagnosis not present

## 2019-09-03 DIAGNOSIS — L97516 Non-pressure chronic ulcer of other part of right foot with bone involvement without evidence of necrosis: Secondary | ICD-10-CM | POA: Diagnosis not present

## 2019-09-03 DIAGNOSIS — E1152 Type 2 diabetes mellitus with diabetic peripheral angiopathy with gangrene: Secondary | ICD-10-CM | POA: Diagnosis not present

## 2019-09-03 DIAGNOSIS — Z95820 Peripheral vascular angioplasty status with implants and grafts: Secondary | ICD-10-CM | POA: Diagnosis not present

## 2019-09-03 NOTE — Progress Notes (Addendum)
[name] ([id number]) Visit Report for 09/03/2019 Allergy List Details Patient Name: Date of Service: [name], [name] 09/03/2019 12:30 PM Medical Record Number:[id number] Patient Account Number: [id number] Date of Birth/Sex: Treating RN: 12-Mar-1941 (78 y.o. Female) Deon Pilling Primary Care Iyania Denne: [name] Other Clinician: Referring Leanda Padmore: Treating Derrika Ruffalo/[name]:[name], [name], [name] Weeks in Treatment: 7 Allergies Active Allergies Demerol scopolamine Bactrim Reaction: increased K+ Versed Reaction: agitation Allergy Notes Electronic Signature(s) Signed: 09/08/2019 1:45:50 PM By: Deon Pilling Entered By: Deon Pilling on 09/08/2019 12:09:38 -------------------------------------------------------------------------------- Arrival Information Details Patient Name: Date of Service: [name] 09/03/2019 12:30 PM Medical Record Number:[id number] Patient Account Number: [id number] Date of Birth/Sex: Treating RN: 19-Jul-1941 (78 y.o. Female) Deon Pilling Primary Care Zoltan Genest: [name] Other Clinician: Referring Vianey Caniglia: Treating Giannina Bartolome/[name]:[name], [name], [name] in Treatment: 7 Visit Information Patient Arrived: Walker Arrival Time: 12:53 Accompanied By: self Transfer Assistance: None Patient Identification Verified: Yes Secondary Verification Process Yes Completed: Patient Requires Transmission- No Based Precautions: Patient Has Alerts: Yes Patient Alerts: Right ABI .62 refused Left ABI .78 T .14 History Since Last Visit Added or deleted any medications: No Any new allergies or adverse reactions: No Had a fall or experienced change No in in activities of daily living that may affect risk of falls: Signs or symptoms of abuse/neglect since last visito No Hospitalized since last visit: No TBI Implantable device outside of the No clinic excluding BI cellular tissue based products placed  in the center since last visit: Has Dressing in Place as Prescribed: Yes Has Footwear/Offloading in Place Yes as Prescribed: Right: Removable Cast Walker/Walking Boot Pain Present Now: No Electronic Signature(s) Signed: 09/03/2019 5:17:33 PM By: Deon Pilling Entered By: Deon Pilling on 09/03/2019 12:53:53 -------------------------------------------------------------------------------- Encounter Discharge Information Details Patient Name: Date of Service: [name] 09/03/2019 12:30 PM Medical Record Number:[id number] Patient Account Number: [id number] Date of Birth/Sex: Treating RN: [birth year] (78 y.o. Female) Carlene Coria Primary Care Rommel Hogston: [name] Other Clinician: Referring Jecenia Leamer: Treating Sherlock Nancarrow/[name]:[name], [name], [name] Weeks in Treatment: 7 Encounter Discharge Information Items Post Procedure Vitals Discharge Condition: Stable Temperature (F): 98.6 Ambulatory Status: Walker Pulse (bpm): 85 Discharge Destination: Home Respiratory Rate (breaths/min): 20 Transportation: Private Auto Blood Pressure (mmHg): 128/52 Accompanied By: self Schedule Follow-up Appointment: Yes Clinical Summary of Care: Patient Declined Electronic Signature(s) Signed: 09/03/2019 4:44:46 PM By: Carlene Coria RN Entered By: Carlene Coria on 09/03/2019 13:53:47 -------------------------------------------------------------------------------- Lower Extremity Assessment Details Patient Name: Date of Service: [name] 09/03/2019 12:30 PM Medical Record Number:[id number] Patient Account Number: [id number] Date of Birth/Sex: Treating RN: December 16, 1940 (78 y.o. Female) Deon Pilling Primary Care Jabin Tapp: [name] Other Clinician: Referring Tobyn Osgood: Treating Margherita Collyer/[name]:[name], [name], [name] Weeks in Treatment: 7 Edema Assessment Assessed: [Left: Yes] [Right: Yes] Edema: [Left: No] [Right: No] Calf Left: Right: Point of  Measurement: 39 cm From Medial Instep 29 cm 27 cm Ankle Left: Right: Point of Measurement: 10 cm From Medial Instep 19 cm 19 cm Electronic Signature(s) Signed: 09/03/2019 5:17:33 PM By: Deon Pilling Entered By: Deon Pilling on 09/03/2019 12:55:12 -------------------------------------------------------------------------------- Multi Wound Chart Details Patient Name: Date of Service: [name] 09/03/2019 12:30 PM Medical Record Number:[id number] Patient Account Number: [id number] Date of Birth/Sex: Treating RN: 08-30-1941 (78 y.o. Female) Primary Care Atwood Adcock: [name] Other Clinician: Referring Jacqueline Spofford: Treating Bryden Darden/[name]:[name], [name], [name] Weeks in Treatment: 7 Vital Signs Height(in): 64 Capillary Blood 106 Glucose(mg/dl): Weight(lbs): 142 Pulse(bpm): 85 Body Mass Index(BMI): 24 Blood Pressure(mmHg): 128/52 Temperature(F): 98.6 Respiratory 20 Rate(breaths/min): Photos: [1:No Photos] [7:No Photos] [N/A:N/A]  Wound Location: [1:Left Toe Fourth - Dorsal] [7:Right Amputation Site - Transmetatarsal] [N/A:N/A] Wounding Event: [1:Blister] [7:Surgical Injury] [N/A:N/A] Primary Etiology: [1:Diabetic Wound/Ulcer of the Diabetic Wound/Ulcer of the N/A Lower Extremity] [7:Lower Extremity] Secondary Etiology: [1:Arterial Insufficiency Ulcer N/A] [N/A:N/A] Comorbid History: [1:Deep Vein Thrombosis, Hypertension, Peripheral Hypertension, Peripheral Arterial Disease, Type II Diabetes] [7:Deep Vein Thrombosis, Arterial Disease, Type II Diabetes] [N/A:N/A] Date Acquired: [1:09/16/2018] [7:08/07/2019] [N/A:N/A] Weeks of Treatment: [1:7] [7:1] [N/A:N/A] Wound Status: [1:Open] [7:Open] [N/A:N/A] Measurements L x W x D 0.9x0.9x0.2 [7:2.2x10.5x1.3] [N/A:N/A] (cm) Area (cm) : [1:0.636] [7:18.143] [N/A:N/A] Volume (cm) : [1:0.127] [7:23.586] [N/A:N/A] % Reduction in Area: [1:10.00%] [7:-75.00%] [N/A:N/A] % Reduction in Volume: -78.90% [7:-2174.40%]  [N/A:N/A] Classification: [1:Grade 2] [7:Grade 4] [N/A:N/A] Wagner Verification: [1:N/A] [7:Other] [N/A:N/A] Exudate Amount: [1:Small] [7:Medium] [N/A:N/A] Exudate Type: [1:Serosanguineous] [7:Serous] [N/A:N/A] Exudate Color: [1:red, brown] [7:amber] [N/A:N/A] Wound Margin: [1:Distinct, outline attached Distinct, outline attached N/A] Granulation Amount: [1:Medium (34-66%)] [7:None Present (0%)] [N/A:N/A] Granulation Quality: [1:Pink] [7:N/A] [N/A:N/A] Necrotic Amount: [1:Medium (34-66%)] [7:Large (67-100%)] [N/A:N/A] Necrotic Tissue: [1:Eschar, Adherent Slough Adherent Slough] [N/A:N/A] Exposed Structures: [1:Fat Layer (Subcutaneous Fat Layer (Subcutaneous N/A Tissue) Exposed: Yes Bone: Yes Fascia: No Tendon: No Muscle: No Joint: No] [7:Tissue) Exposed: Yes Bone: Yes Fascia: No Tendon: No Muscle: No Joint: No] Epithelialization: [1:Small (1-33%)] [7:None] [N/A:N/A] Debridement: [1:Debridement - Excisional N/A] [N/A:N/A] Pre-procedure [1:13:10] [7:N/A] [N/A:N/A] Verification/Time Out Taken: Pain Control: [1:Lidocaine 5% topical ointment] [7:N/A] [N/A:N/A] Tissue Debrided: [1:Bone] [7:N/A] [N/A:N/A] Level: [1:Skin/Subcutaneous Tissue/Muscle/Bone] [7:N/A] [N/A:N/A] Debridement Area (sq cm):0.09 [7:N/A] [N/A:N/A] Instrument: [1:Rongeur] [7:N/A] [N/A:N/A] Bleeding: [1:None] [7:N/A] [N/A:N/A] Procedural Pain: [1:0] [7:N/A] [N/A:N/A] Post Procedural Pain: [1:0] [7:N/A] [N/A:N/A] Debridement Treatment Procedure was tolerated [7:N/A] [N/A:N/A] Response: [1:well] Post Debridement [1:0.9x0.9x0.2] [7:N/A] [N/A:N/A] Measurements L x W x D (cm) Post Debridement [1:0.127] [7:N/A] [N/A:N/A] Volume: (cm) Assessment Notes: [1:N/A] [7:edema and redness noted N/A to periwound. N/A] [N/A:N/A] Treatment Notes Electronic Signature(s) Signed: 09/03/2019 4:44:35 PM By: [name] MD Entered By: [name] on 09/03/2019  13:32:57 -------------------------------------------------------------------------------- Multi-Disciplinary Care Plan Details Patient Name: Date of Service: EYLA, TALLON 09/03/2019 12:30 PM Medical Record Number:[id number] Patient Account Number: [id number] Date of Birth/Sex: Treating RN: 06/06/1941 (78 y.o. Female) Deon Pilling Primary Care Leigha Olberding: [name] Other Clinician: Referring Shawntina Diffee: Treating Tzivia Oneil/[name]:[name], [name], [name] Weeks in Treatment: 7 Active Inactive Abuse / Safety / Falls / Self Care Management Nursing Diagnoses: Potential for falls Goals: Patient will remain injury free related to falls Date Initiated: 08/27/2019 Target Resolution Date: 10/02/2019 Goal Status: Active Interventions: Provide education on fall prevention Notes: Pain, Acute or Chronic Nursing Diagnoses: Pain, acute or chronic: actual or potential Potential alteration in comfort, pain Goals: Patient will verbalize adequate pain control and receive pain control interventions during procedures as needed Date Initiated: 08/27/2019 Target Resolution Date: 10/02/2019 Goal Status: Active Interventions: Encourage patient to take pain medications as prescribed Provide education on pain management Reposition patient for comfort Notes: Electronic Signature(s) Signed: 09/03/2019 5:17:33 PM By: Deon Pilling Entered By: Deon Pilling on 09/03/2019 13:06:00 -------------------------------------------------------------------------------- Pain Assessment Details Patient Name: Date of Service: JARIELYS, GIRARDOT 09/03/2019 12:30 PM Medical Record Number:[id number] Patient Account Number: [id number] Date of Birth/Sex: Treating RN: 1941/08/13 (78 y.o. Female) Deon Pilling Primary Care Cloe Sockwell: [name] Other Clinician: Referring Baylin Cabal: Treating Lowry Bala/[name]:[name], [name], [name] Weeks in Treatment: 7 Active Problems Location of  Pain Severity and Description of Pain Patient Has Paino No Site Locations Rate the pain. Current Pain Level: 0 Pain Management and Medication Current Pain Management: Medication: No Cold Application: No Rest: No Massage:  No Activity: No T.E.N.S.: No Heat Application: No Leg drop or elevation: No Is the Current Pain Management Adequate: Adequate How does your wound impact your activities of daily livingo Sleep: No Bathing: No Appetite: No Relationship With Others: No Bladder Continence: No Emotions: No Bowel Continence: No Work: No Toileting: No Drive: No Dressing: No Hobbies: No Electronic Signature(s) Signed: 09/03/2019 5:17:33 PM By: Deon Pilling Entered By: Deon Pilling on 09/03/2019 12:54:28 -------------------------------------------------------------------------------- Patient/Caregiver Education Details Patient Name: [name] 12/10/2020andnbsp12:30 Date of Service: PM Medical Record [id number] Number: Patient Account Number: [id number] Treating RN: Date of Birth/Gender: 05-15-1941 (78 y.o. Deon Pilling Female) Other Clinician: Primary Care Treating Marcy Panning Physician: Physician/[name]: Referring Physician: Darl Pikes in Treatment: 7 Education Assessment Education Provided To: Patient Education Topics Provided Pain: Handouts: A Guide to Pain Control Methods: Explain/Verbal Responses: Reinforcements needed Electronic Signature(s) Signed: 09/03/2019 5:17:33 PM By: Deon Pilling Entered By: Deon Pilling on 09/03/2019 13:06:11 -------------------------------------------------------------------------------- Wound Assessment Details Patient Name: Date of Service: AZHANE, ECKART 09/03/2019 12:30 PM Medical Record Number:[id number] Patient Account Number: [id number] Date of Birth/Sex: Treating RN: 07/08/1941 (78 y.o. Female) Deon Pilling Primary Care Dianah Pruett: [name] Other  Clinician: Referring Lyndzee Kliebert: Treating Nolah Krenzer/[name]:[name], [name], [name] Weeks in Treatment: 7 Wound Status Wound Number: 1 Primary Diabetic Wound/Ulcer of the Lower Extremity Etiology: Wound Location: Left Toe Fourth - Dorsal Secondary Arterial Insufficiency Ulcer Wounding Event: Blister Etiology: Date Acquired: 09/16/2018 Wound Open Weeks Of Treatment: 7 Status: Clustered Wound: No Comorbid Deep Vein Thrombosis, Hypertension, History: Peripheral Arterial Disease, Type II Diabetes Photos Wound Measurements Length: (cm) 0.9 % Reducti Width: (cm) 0.9 % Reducti Depth: (cm) 0.2 Epithelia Area: (cm) 0.636 Tunnelin Volume: (cm) 0.127 Undermin Wound Description Classification: Grade 2 Wound Margin: Distinct, outline attached Exudate Amount: Small Exudate Type: Serosanguineous Exudate Color: red, brown Wound Bed Granulation Amount: Medium (34-66%) Granulation Quality: Pink Necrotic Amount: Medium (34-66%) Necrotic Quality: Eschar, Adherent Slough Foul Odor After Cleansing: No Slough/Fibrino Yes Exposed Structure Fascia Exposed: No Fat Layer (Subcutaneous Tissue) Exposed: Yes Tendon Exposed: No Muscle Exposed: No Joint Exposed: No Bone Exposed: Yes on in Area: 10% on in Volume: -78.9% lization: Small (1-33%) g: No ing: No Treatment Notes Wound #1 (Left, Dorsal Toe Fourth) 1. Cleanse With Wound Cleanser 3. Primary Dressing Applied Calcium Alginate Ag 4. Secondary Dressing Dry Gauze Roll Gauze 5. Secured With Tape Notes Horticulturist, commercial) Signed: 09/07/2019 3:44:56 PM By: [name] EMT/HBOT Signed: 09/07/2019 5:58:58 PM By: Deon Pilling Previous Signature: 09/03/2019 5:17:33 PM Version By: Deon Pilling Entered By: [name] on 09/07/2019 11:59:48 -------------------------------------------------------------------------------- Wound Assessment Details Patient Name: Date of Service: [name] 09/03/2019  12:30 PM Medical Record Number:[id number] Patient Account Number: [id number] Date of Birth/Sex: Treating RN: Feb 04, 1941 (78 y.o. Female) Deon Pilling Primary Care Marshelle Bilger: [name] Other Clinician: Referring Delmer Kowalski: Treating Akiya Morr/[name]:[name], [name], [name] Weeks in Treatment: 7 Wound Status Wound Number: 7 Primary Diabetic Wound/Ulcer of the Lower Extremity Etiology: Wound Location: Right Amputation Site - Transmetatarsal Wound Open Status: Wounding Event: Surgical Injury Comorbid Deep Vein Thrombosis, Hypertension, Date Acquired: 08/07/2019 History: Peripheral Arterial Disease, Type II Diabetes Weeks Of Treatment: 1 Clustered Wound: No Photos Wound Measurements Length: (cm) 2.2 % Reduction in Width: (cm) 10.5 % Reduction in Depth: (cm) 1.3 Epithelializat Area: (cm) 18.143 Tunneling: Volume: (cm) 23.586 Undermining: Wound Description Classification: Grade 4 Foul Odor Aft Wound Margin: Distinct, outline attached Slough/Fibrin Exudate Amount: Medium Exudate Type: Serous Exudate Color: amber Wound Bed Granulation Amount: None Present (0%)  Necrotic Amount: Large (67-100%) Fascia Expose Necrotic Quality: Adherent Slough Fat Layer (Su Tendon Expose Muscle Expose Joint Exposed Bone Exposed: er Cleansing: No o Yes Exposed Structure d: No bcutaneous Tissue) Exposed: Yes d: No d: No : No Yes Area: -75% Volume: -2174.4% ion: None No No Assessment Notes edema and redness noted to periwound. Treatment Notes 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 Cleanse With Wound Cleanser 3. Primary Dressing Applied Calcium Alginate Ag 4. Secondary Dressing Dry Gauze Roll Gauze 5. Secured With Tape Notes Horticulturist, commercial) Signed: 09/07/2019 3:44:56 PM By: [name] EMT/HBOT Signed: 09/07/2019 5:58:58 PM By: Deon Pilling Previous Signature: 09/03/2019 5:17:33 PM Version By: Deon Pilling Entered By:  [name] on 09/07/2019 11:59:20 -------------------------------------------------------------------------------- Vitals Details Patient Name: Date of Service: [name] 09/03/2019 12:30 PM Medical Record Number:[id number] Patient Account Number: [id number] Date of Birth/Sex: Treating RN: 1941/05/01 (78 y.o. Female) Deon Pilling Primary Care Beckhem Isadore: [name] Other Clinician: Referring Camesha Farooq: Treating Eisa Conaway/[name]:[name], [name], [name] Weeks in Treatment: 7 Vital Signs Time Taken: 12:55 Temperature (F): 98.6 Height (in): 64 Pulse (bpm): 85 Weight (lbs): 142 Respiratory Rate (breaths/min): 20 Body Mass Index (BMI): 24.4 Blood Pressure (mmHg): 128/52 Capillary Blood Glucose (mg/dl): 106 Reference Range: 80 - 120 mg / dl Electronic Signature(s) Signed: 09/03/2019 5:17:33 PM By: Deon Pilling Entered By: Deon Pilling on 09/03/2019 12:56:50

## 2019-09-03 NOTE — Progress Notes (Signed)
[name], [name] ([id number]) Visit Report for 09/03/2019 Debridement Details Patient Name: Date of Service: [name], [name] 09/03/2019 12:30 PM Medical Record Number:[id number] Patient Account Number: [id number] Date of Birth/Sex: Treating RN: [birth year] (78 y.o. F) Primary Care Provider: [name] Other Clinician: Referring Provider: [name] Treating Provider/[name]:Keiyana Stehr, Esperanza Richters in Treatment: 7 Debridement Performed for Wound #1 Left,Dorsal Toe Fourth Assessment: Performed By: Physician Ricard Dillon., MD Debridement Type: Debridement Severity of Tissue Pre Fat layer exposed Debridement: Level of Consciousness (Pre- Awake and Alert procedure): Pre-procedure Verification/Time Out Taken: Yes - 13:10 Start Time: 13:11 Pain Control: Lidocaine 5% topical ointment Total Area Debrided (L x W): 0.3 (cm) x 0.3 (cm) = 0.09 (cm) Tissue and other material Viable, Non-Viable, Bone, Subcutaneous debrided: Level: Skin/Subcutaneous Tissue/Muscle/Bone Debridement Description: Excisional Instrument: Rongeur Bleeding: None End Time: 13:13 Procedural Pain: 0 Post Procedural Pain: 0 Response to Treatment: Procedure was tolerated well Level of Consciousness Awake and Alert (Post-procedure): Post Debridement Measurements of Total Wound Length: (cm) 0.9 Width: (cm) 0.9 Depth: (cm) 0.2 Volume: (cm) 0.127 Character of Wound/Ulcer Post Improved Debridement: Severity of Tissue Post Debridement: Fat layer exposed Post Procedure Diagnosis Same as Pre-procedure Electronic Signature(s) Signed: 09/03/2019 4:44:35 PM By: [name] MD Entered By: [name] on 09/03/2019 13:33:22 -------------------------------------------------------------------------------- Debridement Details Patient Name: Date of Service: [name] 09/03/2019 12:30 PM Medical Record Number:[id number] Patient Account Number: [id number] Date of Birth/Sex: Treating  RN: 1940-11-18 (78 y.o. [name] Shoe, [name] Primary Care Provider: [name] Other Clinician: Referring Provider: [name] Treating Provider/[name]:Annastyn Silvey, Esperanza Richters in Treatment: 7 Debridement Performed for Wound #7 Right Amputation Site - Transmetatarsal Assessment: Performed By: Physician Ricard Dillon., MD Debridement Type: Debridement Severity of Tissue Pre Bone involvement without necrosis Debridement: Level of Consciousness (Pre- Awake and Alert procedure): Pre-procedure Yes - 13:10 Verification/Time Out Taken: Start Time: 13:11 Pain Control: Lidocaine 5% topical ointment Total Area Debrided (L x W): 2 (cm) x 2 (cm) = 4 (cm) Tissue and other material Viable, Non-Viable, Slough, Subcutaneous, Skin: Dermis , Fibrin/Exudate, Slough debrided: Level: Skin/Subcutaneous Tissue Debridement Description: Excisional Instrument: Rongeur Bleeding: None End Time: 13:13 Procedural Pain: 0 Post Procedural Pain: 0 Response to Treatment: Procedure was tolerated well Level of Consciousness Awake and Alert (Post-procedure): Post Debridement Measurements of Total Wound Length: (cm) 2.2 Width: (cm) 10.5 Depth: (cm) 1.3 Volume: (cm) 23.586 Character of Wound/Ulcer Post Improved Debridement: Bone involvement without Severity of Tissue Post Debridement: necrosis Post Procedure Diagnosis Same as Pre-procedure Electronic Signature(s) Signed: 09/03/2019 4:44:35 PM By: [name] MD Signed: 09/03/2019 5:17:33 PM By: Deon Pilling Entered By: Deon Pilling on 09/03/2019 14:34:25 -------------------------------------------------------------------------------- HPI Details Patient Name: Date of Service: [name] 09/03/2019 12:30 PM Medical Record Number:[id number] Patient Account Number: [id number] Date of Birth/Sex: Treating RN: Mar 12, 1941 (78 y.o. F) Primary Care Provider: [name] Other Clinician: Referring Provider: Treating  Provider/[name]:Esgar Barnick, [name], [name] Weeks in Treatment: 7 History of Present Illness HPI Description: ADMISSION 07/13/2019 Patient is a 78 year old type II diabetic. She has known PAD. She has been followed by Dr. Jacqualyn Posey of podiatry for blistering areas on her toes dating back to 04/30/2019 which at this was the left fourth toe. By 8/11 she had wounds on the right and left second toes. She underwent arterial studies by Dr. Alvester Chou on 7/31 that showed ABIs on the right of 0.60 TBI of 0.26 on the left ABI of 0.56 and a TBI of 0.25. By 9/10 she had ischemic-looking wounds per Dr. Earleen Newport on the right first, left  first second and third. She has been using Medihoney and then mupirocin more recently simply Betadine. The patient underwent angiography by Dr. Gwenlyn Found on 9/21. 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 Noted that there was 1 vessel runoff in the foot via the peroneal. On the left there was an 80% left SFA, 70% mid left SFA. There was a short segment calcified CTO distal less than SFA and above-knee popliteal artery reconstituting with two-vessel runoff. 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 It was felt that she had bilateral SFA disease as well as tibial vessel disease. An attempt was made to revascularize the left SFA but they were unable to cross because of the highly calcified nature of the lesion. 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 She has an area on the medial left fourth toe with raised horned callus on top of this. I am not certain what this represents. With regards to pain she has about a 2-1/2 I will claudication tolerance in the grocery store. She has some pain in night which is relieved by putting her feet down  over the side of the bed. She is wearing Nitro-Dur patches on the top of her right foot. Past medical history includes type 2 diabetes with secondary PAD, neoplasm of the skin, diabetic retinopathy, carotid artery stenosis, hyperlipidemia hypertension. 11/2; the last time the patient was here I spoke to Dr. Gwenlyn Found about revascularization options on the right. As I understand things currently Dr. Gwenlyn Found spoke with Dr. Trula Slade and ultimately the patient was taken to surgery on 10/27. She had a right iliofemoral endarterectomy with a bovine patch angioplasty. I think the plan now is for her to have a staged intervention on the right SFA by Dr. Alvester Chou. Per the patient's understanding Dr. Gwenlyn Found and Dr. Jacqualyn Posey are waiting to see when the gangrenous toes on the right foot can be amputated. The patient states her pain is a lot better and she is grateful for that. 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 Small area on the left fourth toe. She is using Betadine to the mummified areas on the right and Medihoney on the left 08/27/2019; the last time I saw this patient I discharged her from the clinic. She had been revascularized by Dr. Trula Slade and she had a right iliofemoral endarterectomy with a bovine angioplasty. 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 Jacqualyn Posey of podiatry on 08/07/2019. I note that she also had a intervention by Dr. Gwenlyn Found and he performed a directional atherectomy and drug-coated balloon angioplasty of the SFA and popliteal artery on the right. I am not certain of the date of Dr. Kennon Holter procedure as of the time of this dictation. She was referred back to [place] by Dr. Earleen Newport predominantly for follow-up of the left fourth toe. She still has sutures and stitches in the right TMA site. She states her pain is a lot better. She expresses concern about the condition of the amputation site at the TMA. She is on  doxycycline I think prescribed by Dr. Earleen Newport. She is complaining of some pain at night 12/10; I spoke to Dr. Jacqualyn Posey last week. He removed the sutures on the right foot  on Monday of this week. She has the area on the left fourth toe just proximal to the PIP and then the right TMA site. She is still on doxycycline and has enough through next week. Unfortunately the TMA site does not look good at all. Both on the lateral and medial part of the incisions are areas that probe to bone. There is purulence over the medial part which I have cultured. We will use silver alginate. Left fourth toe looks somewhat better but there was still exposed bone Electronic Signature(s) Signed: 09/03/2019 4:44:35 PM By: [name] MD Entered By: [name] on 09/03/2019 13:41:06 -------------------------------------------------------------------------------- Physical Exam Details Patient Name: Date of Service: [name] 09/03/2019 12:30 PM Medical Record Number:[id number] Patient Account Number: [id number] Date of Birth/Sex: Treating RN: 04/09/1941 (78 y.o. F) Primary Care Provider: [name] Other Clinician: Referring Provider: Treating Provider/[name]:Thatcher Doberstein, [name], [name] Weeks in Treatment: 7 Constitutional Sitting or standing Blood Pressure is within target range for patient.. Pulse regular and within target range for patient.Marland Kitchen Respirations regular, non-labored and within target range.. Temperature is normal and within the target range for the patient.Marland Kitchen Appears in no distress. Notes Wound exam The real problem here is the right TMA site. Both on the lateral medial part of the incision are exposed areas of bone especially medially where this extends I think and that the second met head. Purulent drainage from the site. Both areas debrided of necrotic surface. Laterally this does not look so bad however medially there is very little tissue that is obvious The  area on the left fourth toe just proximal to the PIP. Although the surface of this looks healthy there is protruding exposed bone using rongeurs I have removed this. Hemostasis with direct pressure Electronic Signature(s) Signed: 09/03/2019 4:44:35 PM By: [name] MD Entered By: [name] on 09/03/2019 13:43:03 -------------------------------------------------------------------------------- Physician Orders Details Patient Name: Date of Service: [name] 09/03/2019 12:30 PM Medical Record Number:[id number] Patient Account Number: [id number] Date of Birth/Sex: Treating RN: [birth year] (78 y.o. 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 - Thursday Dressing Change Frequency Change dressing every day. - home health to change twice a week, wound center weekly, and all other days patient to change. Wound Cleansing Clean wound with Wound Cleanser Primary Wound Dressing Wound #1 Left,Dorsal Toe Fourth Calcium Alginate with Silver Wound #7 Right Amputation Site - Transmetatarsal Calcium Alginate with Silver Secondary Dressing Wound #1 Left,Dorsal Toe Fourth Kerlix/Rolled Gauze Dry Gauze Wound #7 Right Amputation Site - Transmetatarsal Kerlix/Rolled Gauze ABD pad Edema Control Avoid standing for long periods of time Elevate legs to  the level of the heart or above for 30 minutes daily and/or when sitting, a frequency of: - throughout the day. [facility] for wound care. - [facility] twice a week. 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 If appropriate for treatment, begin HBOT per protocol: 2.5 ATA for 90  Minutes with 2 Five (5) Minute Air Breaks Total Number of Treatments: - 40 One treatments per day (delivered Monday through Friday unless otherwise specified in Special Instructions below): Antihistamine 30 minutes prior to HBO Treatment, difficulty clearing ears. Finger stick Blood Glucose Pre- and Post- HBOT Treatment. Follow Hyperbaric Oxygen Glycemia Protocol Laboratory Bacteria identified in Unspecified specimen by Anaerobe culture (MICRO) - culture of right foot wound. someone will call you with any abnormal results. - (ICD10 E11.621 - Type 2 diabetes mellitus with foot ulcer) LOINC Code: 379-0 Convenience Name: Anerobic culture Radiology X-ray, foot - x-ray of bilateral feet related to non-healing diabetic foot ulcers. CPT code - (ICD10 E11.621 - Type 2 diabetes mellitus with foot ulcer) X-ray, Chest - chest x-ray hyberbaric protocol. ICD 10 code I10.0 CPT code GLYCEMIA INTERVENTIONS PROTOCOL PRE-HBO GLYCEMIA INTERVENTIONS ACTION INTERVENTION Obtain pre-HBO capillary blood 1 glucose (ensure physician order is in chart). A. Notify HBO physician and await physician orders. 2 If result is 70 mg/dl or below: B. If the result meets the hospital definition of a critical result, follow hospital policy. A. Give patient an 8 ounce Glucerna Shake, an 8 ounce Ensure, or 8 ounces of a Glucerna/Ensure equivalent dietary supplement*. B. Wait 30 minutes. C. Retest patients capillary blood If result is 71 mg/dl to 130 mg/dl: glucose (CBG). D. If result greater than or equal to 110 mg/dl,  proceed with HBO. If result less than 110 mg/dl, notify HBO physician and consider holding HBO. If result is 131 mg/dl to 249 mg/dl: A. Proceed with HBO. A. Notify HBO physician and await physician orders. B. It is recommended to hold HBO If result is 250 mg/dl or greater: and do blood/urine ketone testing. C. If the result meets the hospital definition of a critical result, follow hospital policy. POST-HBO GLYCEMIA INTERVENTIONS ACTION INTERVENTION Obtain post HBO capillary blood 1 glucose (ensure physician order is in chart). A. Notify HBO physician and await physician orders. 2 If result is 70 mg/dl or below: B. If the result meets the hospital definition of a critical result, follow hospital policy. A. Give patient an 8 ounce Glucerna Shake, an 8 ounce Ensure, or 8 ounces of a Glucerna/Ensure equivalent dietary supplement*. B. Wait 15 minutes for symptoms of hypoglycemia (i.e. nervousness, anxiety, sweating, chills, If result is 71 mg/dl to 100 mg/dl: clamminess, irritability, confusion, tachycardia or dizziness). C. If patient asymptomatic, discharge patient. If patient symptomatic, repeat capillary blood glucose (CBG) and notify HBO physician. If result is 101 mg/dl to 249 mg/dl: A. Discharge patient. A. Notify HBO physician and await physician orders. B. It is recommended to do If result is 250 mg/dl or greater: blood/urine ketone testing. C. If the result meets the hospital definition of a critical result, follow hospital policy. *Juice or candies are NOT equivalent products. If patient refuses the Glucerna or Ensure, please consult the hospital dietitian for an appropriate substitute. Electronic Signature(s) Signed: 09/03/2019 4:44:35 PM By: [name] MD Signed: 09/03/2019 5:17:33 PM By: Deon Pilling Entered By: Deon Pilling on 09/03/2019 13:29:09 -------------------------------------------------------------------------------- Prescription  09/03/2019 Patient Name: [name] Provider: [name] MD Date of Birth: Oct 03, 1940 NPI#: [id number] Sex: F DEA#: [id number] Phone #: [phone] License #: [id number] Patient Address: [place] [address] [place], [place] [address] [place], [place] [address] [phone] Allergies Demerol scopolamine Provider's Orders X-ray, foot - ICD10: E11.621 - x-ray of bilateral feet related to non-healing diabetic foot ulcers. CPT code Signature(s): Date(s):  Prescription 09/03/2019 Patient Name: [name] Provider: [name] MD Date of Birth: [birth year] NPI#: [id number] Sex: F DEA#: [id number] Phone #: [phone] License #: [id number] Patient Address: [place] [address] [place], [place] [address] [place], [place] [address] [phone] Allergies Demerol scopolamine Provider's Orders X-ray, Chest - chest x-ray hyberbaric protocol. ICD 10 code I10.0 CPT code Signature(s): Date(s): Electronic Signature(s) Signed: 09/03/2019 4:44:35 PM By: [name] MD Signed: 09/03/2019 5:17:33 PM By: Deon Pilling Entered By: Deon Pilling on 09/03/2019 13:29:10 --------------------------------------------------------------------------------  Problem List Details Patient Name: Date of Service: [name] 09/03/2019 12:30 PM Medical Record Number:[id number] Patient Account Number: [id number] Date of Birth/Sex: Treating RN: [birth year] (78 y.o. [name], [name] Primary Care Provider: [name] Other Clinician: Referring Provider: Treating Provider/[name]:Priscella Donna, [name], [name] Weeks in Treatment: 7 Active Problems ICD-10 Evaluated Encounter Code Description Active Date Today Diagnosis E11.621 Type 2 diabetes mellitus with foot ulcer 07/13/2019 No Yes E11.52 Type 2 diabetes mellitus with  diabetic peripheral 07/13/2019 No Yes angiopathy with gangrene L97.518 Non-pressure chronic ulcer of other part of right foot 07/13/2019 No Yes with other specified severity L97.528 Non-pressure chronic ulcer of other part of left foot 07/13/2019 No Yes with other specified severity E11.42 Type 2 diabetes mellitus with diabetic polyneuropathy 07/13/2019 No Yes Inactive Problems Resolved Problems Electronic Signature(s) Signed: 09/03/2019 4:44:35 PM By: [name] MD Entered By: [name] on 09/03/2019 13:32:44 -------------------------------------------------------------------------------- Progress Note Details Patient Name: Date of Service: [name] 09/03/2019 12:30 PM Medical Record Number:[id number] Patient Account Number: [id number] Date of Birth/Sex: Treating RN: 02-Dec-1940 (78 y.o. F) Primary Care Provider: [name] Other Clinician: Referring Provider: Treating Provider/[name]:Shriyan Arakawa, [name], [name] Weeks in Treatment: 7 Subjective History of Present Illness (HPI) ADMISSION 07/13/2019 Patient is a 78 year old type II diabetic. She has known PAD. She has been followed by Dr. Jacqualyn Posey of podiatry for blistering areas on her toes dating back to 04/30/2019 which at this was the left fourth toe. By 8/11 she had wounds on the right and left second toes. She underwent arterial studies by Dr. Alvester Chou on 7/31 that showed ABIs on the right of 0.60 TBI of 0.26 on the left ABI of 0.56 and a TBI of 0.25. By 9/10 she had ischemic-looking wounds per Dr. Earleen Newport on the right first, left first second and third. She has been using Medihoney and then mupirocin more recently simply Betadine. The patient underwent angiography by Dr. Gwenlyn Found on 9/21. 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 Noted that there was 1 vessel runoff in the foot via  the peroneal. ooOn the left there was an 80% left SFA, 70% mid left SFA. There was a short segment calcified CTO distal less than SFA and above-knee popliteal artery reconstituting with two-vessel runoff. 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 It was felt that she had bilateral SFA disease as well as tibial vessel disease. An attempt was made to revascularize the left SFA but they were unable to cross because of the highly calcified nature of the lesion. 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 She has an area on the medial left fourth toe with raised horned callus on top of this. I am not  certain what this represents. With regards to pain she has about a 2-1/2 I will claudication tolerance in the grocery store. She has some pain in night which is relieved by putting her feet down over the side of the bed. She is wearing Nitro-Dur patches on the top of her right foot. Past medical history includes type 2 diabetes with secondary PAD, neoplasm of the skin, diabetic retinopathy, carotid artery stenosis, hyperlipidemia hypertension. 11/2; the last time the patient was here I spoke to Dr. Gwenlyn Found about revascularization options on the right. As I understand things currently Dr. Gwenlyn Found spoke with Dr. Trula Slade and ultimately the patient was taken to surgery on 10/27. She had a right iliofemoral endarterectomy with a bovine patch angioplasty. I think the plan now is for her to have a staged intervention on the right SFA by Dr. Alvester Chou. Per the patient's understanding Dr. Gwenlyn Found and Dr. Jacqualyn Posey are waiting to see when the gangrenous toes on the right foot can be amputated. The patient states her pain is a lot better and she is grateful for that. 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 Small area on the left fourth toe. She is using Betadine to  the mummified areas on the right and Medihoney on the left 08/27/2019; the last time I saw this patient I discharged her from the clinic. She had been revascularized by Dr. Trula Slade and she had a right iliofemoral endarterectomy with a bovine angioplasty. 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 Jacqualyn Posey of podiatry on 08/07/2019. I note that she also had a intervention by Dr. Gwenlyn Found and he performed a directional atherectomy and drug-coated balloon angioplasty of the SFA and popliteal artery on the right. I am not certain of the date of Dr. Kennon Holter procedure as of the time of this dictation. She was referred back to [place] by Dr. Earleen Newport predominantly for follow-up of the left fourth toe. She still has sutures and stitches in the right TMA site. She states her pain is a lot better. She expresses concern about the condition of the amputation site at the TMA. She is on doxycycline I think prescribed by Dr. Earleen Newport. She is complaining of some pain at night 12/10; I spoke to Dr. Jacqualyn Posey last week. He removed the sutures on the right foot on Monday of this week. She has the area on the left fourth toe just proximal to the PIP and then the right TMA site. She is still on doxycycline and has enough through next week. Unfortunately the TMA site does not look good at all. Both on the lateral and medial part of the incisions are areas that probe to bone. There is purulence over the medial part which I have cultured. We will use silver alginate. Left fourth toe looks somewhat better but there was still exposed bone Objective Constitutional Sitting or standing Blood Pressure is within target range for patient.. Pulse regular and within target range for patient.Marland Kitchen Respirations regular, non-labored and within target range.. Temperature is normal and within the target range for the patient.Marland Kitchen Appears in no distress. Vitals Time Taken: 12:55 PM, Height: 64 in, Weight: 142  lbs, BMI: 24.4, Temperature: 98.6 F, Pulse: 85 bpm, Respiratory Rate: 20 breaths/min, Blood Pressure: 128/52 mmHg, Capillary Blood Glucose: 106 mg/dl. General Notes: Wound exam ooThe real problem here is the right TMA site. Both on the lateral medial part of the incision are exposed areas of bone especially medially  where this extends I think and that the second met head. Purulent drainage from the site. Both areas debrided of necrotic surface. Laterally this does not look so bad however medially there is very little tissue that is obvious ooThe area on the left fourth toe just proximal to the PIP. Although the surface of this looks healthy there is protruding exposed bone using rongeurs I have removed this. Hemostasis with direct pressure Integumentary (Hair, Skin) Wound #1 status is Open. Original cause of wound was Blister. The wound is located on the Left,Dorsal Toe Fourth. The wound measures 0.9cm length x 0.9cm width x 0.2cm depth; 0.636cm^2 area and 0.127cm^3 volume. There is bone and Fat Layer (Subcutaneous Tissue) Exposed exposed. There is no tunneling or undermining noted. There is a small amount of serosanguineous drainage noted. The wound margin is distinct with the outline attached to the wound base. There is medium (34-66%) pink granulation within the wound bed. There is a medium (34-66%) amount of necrotic tissue within the wound bed including Eschar and Adherent Slough. Wound #7 status is Open. Original cause of wound was Surgical Injury. 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 The wound measures 2.2cm length x 10.5cm width x 1.3cm depth; 18.143cm^2 area and 23.586cm^3 volume. There is bone and Fat Layer (Subcutaneous Tissue) Exposed exposed. There is no tunneling or undermining noted. There is a medium amount of serous drainage noted. The wound margin is distinct with the outline attached to the wound base. There is no granulation within the wound  bed. There is a large (67- 100%) amount of necrotic tissue within the wound bed including Adherent Slough. General Notes: edema and redness noted to periwound. 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 There was a Excisional Skin/Subcutaneous Tissue/Muscle/Bone Debridement with a total area of 0.09 sq cm performed by Ricard Dillon., MD. With the following instrument(s): Rongeur to remove Viable and Non-Viable tissue/material. Material removed includes Bone,Subcutaneous Tissue and after achieving pain control using Lidocaine 5% topical ointment. A time out was conducted at 13:10, prior to the start of the procedure. There was no bleeding. The procedure was tolerated well with a pain level of 0 throughout and a pain level of 0 following the procedure. Post Debridement Measurements: 0.9cm length x 0.9cm width x 0.2cm depth; 0.127cm^3 volume. Character of Wound/Ulcer Post Debridement is improved. Severity of Tissue Post Debridement is: Fat layer exposed. Post procedure Diagnosis Wound #1: Same as Pre-Procedure Wound #7 Pre-procedure diagnosis of Wound #7 is a Diabetic Wound/Ulcer of the Lower Extremity located on the Right Amputation Site - Transmetatarsal .Severity of Tissue Pre Debridement is: Bone involvement without necrosis. There was a Excisional Skin/Subcutaneous Tissue Debridement with a total area of 4 sq cm performed by Ricard Dillon., MD. With the  following instrument(s): Rongeur to remove Viable and Non-Viable tissue/material. Material removed includes Subcutaneous Tissue, Slough, Skin: Dermis, and Fibrin/Exudate after achieving pain control using Lidocaine 5% topical ointment. A time out was conducted at 13:10, prior to the start of the procedure. There was  no bleeding. The procedure was tolerated well with a pain level of 0 throughout and a pain level of 0 following the procedure. Post Debridement Measurements: 2.2cm length x 10.5cm width x 1.3cm depth; 23.586cm^3 volume. Character of Wound/Ulcer Post Debridement is improved. Severity of Tissue Post Debridement is: Bone involvement without necrosis. Post procedure Diagnosis Wound #7: Same as Pre-Procedure Plan Follow-up Appointments: Return Appointment in 1 week. - Thursday Dressing Change Frequency: Change dressing every day. - home health to change twice a week, wound center weekly, and all other days patient to change. Wound Cleansing: Clean wound with Wound Cleanser Primary Wound Dressing: Wound #1 Left,Dorsal Toe Fourth: Calcium Alginate with Silver Wound #7 Right Amputation Site - Transmetatarsal: Calcium Alginate with Silver Secondary Dressing: Wound #1 Left,Dorsal Toe Fourth: Kerlix/Rolled Gauze Dry Gauze Wound #7 Right Amputation Site - Transmetatarsal: Kerlix/Rolled Gauze ABD pad Edema Control: Avoid standing for long periods of time Elevate legs to the level of the heart or above for 30 minutes daily and/or when sitting, a frequency of: - throughout the day. Home Health: [facility] for wound care. - [facility] twice a week. 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 If appropriate for treatment, begin HBOT per protocol: 2.5 ATA for 90 Minutes with 2 Five (5) Minute Air Breaks Total Number of Treatments: - 40 One treatments per day (delivered Monday  through Friday unless otherwise specified in Special Instructions below): Antihistamine 30 minutes prior to HBO Treatment, difficulty clearing ears. Finger stick Blood Glucose Pre- and Post- HBOT Treatment. Follow Hyperbaric Oxygen Glycemia Protocol Radiology ordered were: X-ray, foot - x-ray of bilateral feet related to non-healing diabetic foot ulcers. CPT code, X-ray, Chest - chest x-ray hyberbaric protocol. ICD 10 code I10.0 CPT code Laboratory ordered were: Anerobic culture - culture of right foot wound. someone will call you with any abnormal results. 1. Silver alginate to all wound areas 2. X-rays of both feet 3. What looks like purulent drainage from the medial part of the right TMA. Specimen for CandS 4. It would be possible to get bone for pathology and CNS on the right although she is on doxycycline. Preferably I would like her off antibiotics 5. The patient has Wagner 4 diabetic foot wounds. She is eligible for hyperbaric oxygen. I have put in orders for 2.5 atm for 40 treatments I have talked to her about this in some detail. The goal here will be closure of her wounds. 6. The patient may also have underlying osteomyelitis. Plain x-rays of both feet. 7. I will contact Dr. Gwenlyn Found of infectious disease to review her last angiogram. She has already been revascularized by both vascular surgery and Dr. Gwenlyn Found Electronic Signature(s) Signed: 09/03/2019 4:44:35 PM By: [name] MD Signed: 09/03/2019 5:17:33 PM By: Deon Pilling Entered By: Deon Pilling on 09/03/2019 14:34:34 -------------------------------------------------------------------------------- SuperBill Details Patient Name: Date of Service: [name] 09/03/2019 Medical Record Number:[id number] Patient Account Number: [id number] Date of Birth/Sex: Treating RN: 1941-04-12 (78 y.o. F) Primary Care Provider: [name] Other Clinician: Referring Provider: Treating Provider/[name]:Mary Hockey,  [name], [name] Weeks in Treatment: 7 Diagnosis Coding ICD-10 Codes Code Description E11.621 Type 2 diabetes mellitus with foot ulcer E11.52 Type 2 diabetes mellitus with diabetic peripheral angiopathy with gangrene L97.518 Non-pressure chronic ulcer of other part of right foot with other specified severity L97.528 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ity E11.42 Type 2 diabetes mellitus  with diabetic polyneuropathy Facility Procedures The patient participates with Medicare or their insurance follows the Medicare Facility Guidelines: CPT4 Code Description Modifier Quantity [id number] 11042 - DEB SUBQ TISSUE 20 SQ CM/< 59 1 ICD-10 Diagnosis Description L97.518 Non-pressure chronic ulcer  of other part of right foot with other specified severity Physician Procedures CPT4: Code Description Modifier Quantity [id number] - WC PHYS SUBQ TISS 20 SQ CM 59 1 ICD-10 Diagnosis Description L97.518 Non-pressure chronic ulcer of other part of right foot with other specified severity CPT4: [id number] Debridement; bone (includes epidermis, dermis, subQ tissue, muscle and/or 1 fascia, if performed) 1st 20 sqcm or less ICD-10 Diagnosis Description L97.528 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ity Electronic Signature(s) Signed: 09/03/2019 4:44:35 PM By: [name] MD Entered By: [name] on 09/03/2019 13:48:53

## 2019-09-04 ENCOUNTER — Ambulatory Visit (HOSPITAL_COMMUNITY)
Admission: RE | Admit: 2019-09-04 | Discharge: 2019-09-04 | Disposition: A | Discharge status: Home / Self Care | Payer: Medicare Other | Source: Ambulatory Visit | Attending: Internal Medicine | Admitting: Internal Medicine

## 2019-09-04 ENCOUNTER — Other Ambulatory Visit (HOSPITAL_COMMUNITY): Payer: Self-pay | Admitting: Internal Medicine

## 2019-09-04 DIAGNOSIS — F418 Other specified anxiety disorders: Secondary | ICD-10-CM

## 2019-09-04 DIAGNOSIS — M869 Osteomyelitis, unspecified: Secondary | ICD-10-CM

## 2019-09-04 DIAGNOSIS — E1169 Type 2 diabetes mellitus with other specified complication: Secondary | ICD-10-CM

## 2019-09-04 DIAGNOSIS — R918 Other nonspecific abnormal finding of lung field: Secondary | ICD-10-CM | POA: Diagnosis not present

## 2019-09-04 DIAGNOSIS — E11621 Type 2 diabetes mellitus with foot ulcer: Secondary | ICD-10-CM | POA: Diagnosis not present

## 2019-09-04 DIAGNOSIS — L97509 Non-pressure chronic ulcer of other part of unspecified foot with unspecified severity: Secondary | ICD-10-CM | POA: Insufficient documentation

## 2019-09-04 DIAGNOSIS — F419 Anxiety disorder, unspecified: Secondary | ICD-10-CM

## 2019-09-04 DIAGNOSIS — Z89421 Acquired absence of other right toe(s): Secondary | ICD-10-CM | POA: Diagnosis not present

## 2019-09-07 ENCOUNTER — Encounter: Payer: Medicare Other | Admitting: Podiatry

## 2019-09-07 DIAGNOSIS — Z4781 Encounter for orthopedic aftercare following surgical amputation: Secondary | ICD-10-CM | POA: Diagnosis not present

## 2019-09-07 DIAGNOSIS — E1152 Type 2 diabetes mellitus with diabetic peripheral angiopathy with gangrene: Secondary | ICD-10-CM | POA: Diagnosis not present

## 2019-09-07 DIAGNOSIS — I10 Essential (primary) hypertension: Secondary | ICD-10-CM | POA: Diagnosis not present

## 2019-09-07 DIAGNOSIS — K219 Gastro-esophageal reflux disease without esophagitis: Secondary | ICD-10-CM | POA: Diagnosis not present

## 2019-09-07 DIAGNOSIS — D649 Anemia, unspecified: Secondary | ICD-10-CM | POA: Diagnosis not present

## 2019-09-07 DIAGNOSIS — E785 Hyperlipidemia, unspecified: Secondary | ICD-10-CM | POA: Diagnosis not present

## 2019-09-07 LAB — AEROBIC CULTURE W GRAM STAIN (SUPERFICIAL SPECIMEN)

## 2019-09-07 NOTE — Progress Notes (Signed)
Subjective: [name] is a 78 y.o. is seen today in office s/p right TMA preformed on 08/07/2019.  She presents today for suture removal and staple removal.  She was seen at the wound care center.  They have been using a silver dressing to help dry the incision.  She is some occasional discomfort but nothing significant.  They have been using Iodosorb on the left fourth toe.  She has no new concerns. She denies any fevers, chills, nausea, vomiting.  No calf pain, chest pain or shortness of breath.  Objective: General: No acute distress, AAOx3  Neurovascular status unchanged RIGHT foot: Sutures, staples intact.  Along the medial aspect of the incision there is dehiscence of the incision was having fibrotic tissue.  There is mild surrounding erythema to the incision but there is no ascending cellulitis.  No significant drainage identified today.  No fluctuation or crepitation.  There is no malodor. LEFT foot: Wound is stable in the fourth toe.  Superficial wound of the distal has been of the third toe as well.  No drainage or pus from either area no signs of infection. No pain with calf compression, swelling, warmth, erythema.   Assessment and Plan:  Status post right transmetatarsal amputation, mild erythema/macerated tissue  -Treatment options discussed including all alternatives, risks, and complications -Staples, sutures removed on the right side.  Continue with silver dressing per wound care.  She is to continue to follow the wound care center for both feet.  I will defer to them for further treatment.  However if she needs anything to let me know.  She verbalized understanding had no further questions or concerns today.  Return if symptoms worsen or fail to improve.  Trula Slade DPM

## 2019-09-09 ENCOUNTER — Other Ambulatory Visit: Payer: Self-pay | Admitting: Internal Medicine

## 2019-09-09 ENCOUNTER — Other Ambulatory Visit (HOSPITAL_COMMUNITY): Payer: Self-pay | Admitting: Internal Medicine

## 2019-09-09 DIAGNOSIS — E13621 Other specified diabetes mellitus with foot ulcer: Secondary | ICD-10-CM

## 2019-09-09 DIAGNOSIS — L97509 Non-pressure chronic ulcer of other part of unspecified foot with unspecified severity: Secondary | ICD-10-CM

## 2019-09-10 ENCOUNTER — Other Ambulatory Visit: Payer: Self-pay

## 2019-09-10 ENCOUNTER — Encounter (HOSPITAL_BASED_OUTPATIENT_CLINIC_OR_DEPARTMENT_OTHER): Payer: Medicare Other | Admitting: Internal Medicine

## 2019-09-10 DIAGNOSIS — E11621 Type 2 diabetes mellitus with foot ulcer: Secondary | ICD-10-CM | POA: Diagnosis not present

## 2019-09-10 DIAGNOSIS — D649 Anemia, unspecified: Secondary | ICD-10-CM | POA: Diagnosis not present

## 2019-09-10 DIAGNOSIS — L97528 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ity: Secondary | ICD-10-CM | POA: Diagnosis not present

## 2019-09-10 DIAGNOSIS — K219 Gastro-esophageal reflux disease without esophagitis: Secondary | ICD-10-CM | POA: Diagnosis not present

## 2019-09-10 DIAGNOSIS — Z4781 Encounter for orthopedic aftercare following surgical amputation: Secondary | ICD-10-CM | POA: Diagnosis not present

## 2019-09-10 DIAGNOSIS — E1152 Type 2 diabetes mellitus with diabetic peripheral angiopathy with gangrene: Secondary | ICD-10-CM | POA: Diagnosis not present

## 2019-09-10 DIAGNOSIS — Z89421 Acquired absence of other right toe(s): Secondary | ICD-10-CM | POA: Diagnosis not present

## 2019-09-10 DIAGNOSIS — Z95820 Peripheral vascular angioplasty status with implants and grafts: Secondary | ICD-10-CM | POA: Diagnosis not present

## 2019-09-10 DIAGNOSIS — T8189XA Other complications of procedures, not elsewhere classified, initial encounter: Secondary | ICD-10-CM | POA: Diagnosis not present

## 2019-09-10 DIAGNOSIS — I10 Essential (primary) hypertension: Secondary | ICD-10-CM | POA: Diagnosis not present

## 2019-09-10 DIAGNOSIS — L97516 Non-pressure chronic ulcer of other part of right foot with bone involvement without evidence of necrosis: Secondary | ICD-10-CM | POA: Diagnosis not present

## 2019-09-10 DIAGNOSIS — L97518 Non-pressure chronic ulcer of other part of right foot with other specified severity: Secondary | ICD-10-CM | POA: Diagnosis not present

## 2019-09-10 DIAGNOSIS — E785 Hyperlipidemia, unspecified: Secondary | ICD-10-CM | POA: Diagnosis not present

## 2019-09-10 NOTE — Progress Notes (Signed)
[name], [name] ([id number]) Visit Report for 09/10/2019 Debridement Details Patient Name: Date of Service: [name], [name] 09/10/2019 9:30 AM Medical Record Number:[id number] Patient Account Number: [id number] Date of Birth/Sex: Treating RN: [birth year] (78 y.o. [name], [name] Primary Care Provider: [name] Other Clinician: Referring Provider: [name] Treating Provider/[name]:Earleen Aoun, Esperanza Richters in Treatment: 8 Debridement Performed for Wound #7 Right Amputation Site - Transmetatarsal Assessment: Performed By: Physician Ricard Dillon., MD Debridement Type: Debridement Severity of Tissue Pre Bone involvement without necrosis Debridement: Level of Consciousness (Pre- Awake and Alert procedure): Pre-procedure Verification/Time Out Taken: Yes - 10:40 Start Time: 10:41 Pain Control: Lidocaine 4% Topical Solution Total Area Debrided (L x W): 1.5 (cm) x 5 (cm) = 7.5 (cm) Tissue and other material Non-Viable, Eschar, Fat, Slough, Subcutaneous, Skin: Dermis , Fibrin/Exudate, debrided: Slough Level: Skin/Subcutaneous Tissue Debridement Description: Excisional Instrument: Blade, Forceps Bleeding: Minimum Hemostasis Achieved: Pressure End Time: 10:45 Procedural Pain: 0 Post Procedural Pain: 0 Response to Treatment: Procedure was tolerated well Level of Consciousness Awake and Alert (Post-procedure): Post Debridement Measurements of Total Wound Length: (cm) 1.5 Width: (cm) 9.2 Depth: (cm) 1.3 Volume: (cm) 14.09 Character of Wound/Ulcer Post Requires Further Debridement Debridement: Severity of Tissue Post Debridement: Bone involvement without necrosis Post Procedure Diagnosis Same as Pre-procedure Electronic Signature(s) Signed: 09/10/2019 5:01:00 PM By: [name] MD Signed: 09/10/2019 5:34:18 PM By: Deon Pilling Entered By: [name] on 09/10/2019  11:27:35 -------------------------------------------------------------------------------- HPI Details Patient Name: Date of Service: [name] 09/10/2019 9:30 AM Medical Record Number:[id number] Patient Account Number: [id number] Date of Birth/Sex: Treating RN: 1940-09-26 (78 y.o. [name] Primary Care Provider: [name] Other Clinician: Referring Provider: Treating Provider/[name]:Lejend Dalby, [name], [name] Weeks in Treatment: 8 History of Present Illness HPI Description: ADMISSION 07/13/2019 Patient is a 78 year old type II diabetic. She has known PAD. She has been followed by Dr. Jacqualyn Posey of podiatry for blistering areas on her toes dating back to 04/30/2019 which at this was the left fourth toe. By 8/11 she had wounds on the right and left second toes. She underwent arterial studies by Dr. Alvester Chou on 7/31 that showed ABIs on the right of 0.60 TBI of 0.26 on the left ABI of 0.56 and a TBI of 0.25. By 9/10 she had ischemic-looking wounds per Dr. Earleen Newport on the right first, left first second and third. She has been using Medihoney and then mupirocin more recently simply Betadine. The patient underwent angiography by Dr. Gwenlyn Found on 9/21. 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 Noted that there was 1 vessel runoff in the foot via the peroneal. On the left there was an 80% left SFA, 70% mid left SFA. There was a short segment calcified CTO distal less than SFA and above-knee popliteal artery reconstituting with two-vessel runoff. 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 It was felt that she had bilateral SFA disease as well as tibial vessel disease. An attempt was made to revascularize the left SFA but they were unable to cross because of the highly calcified nature of the lesion. 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 She has an area on the medial left fourth toe with raised horned callus on top of this. I am not certain what this represents. With regards to pain  she has about a 2-1/2 I will claudication tolerance in the grocery store. She has some pain in night which is relieved by putting her feet down over the side of the bed. She is wearing Nitro-Dur patches on the top of her right foot. Past medical history includes type 2 diabetes with secondary PAD, neoplasm of the skin, diabetic retinopathy, carotid artery stenosis, hyperlipidemia hypertension. 11/2; the last time the patient was here I spoke to Dr. Gwenlyn Found about revascularization options on the right. As I understand things currently Dr. Gwenlyn Found spoke with Dr. Trula Slade and ultimately the patient was taken to surgery on 10/27. She had a right iliofemoral endarterectomy with a bovine patch angioplasty. I think the plan now is for her to have a staged intervention on the right SFA by Dr. Alvester Chou. Per the patient's understanding Dr. Gwenlyn Found and Dr. Jacqualyn Posey are waiting to see when the gangrenous toes on the right foot can be amputated. The patient states her pain is a lot better and she is grateful for that. 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 Small area on the left fourth toe. She is using Betadine to the mummified areas on the right and Medihoney on the left 08/27/2019; the last time I saw this patient I discharged her from the clinic. She had been revascularized by Dr. Trula Slade and she had a right iliofemoral endarterectomy with a bovine angioplasty. 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 Jacqualyn Posey of podiatry on 08/07/2019. I note that she also had a intervention by Dr. Gwenlyn Found and he performed a directional atherectomy and drug-coated balloon angioplasty of the SFA and  popliteal artery on the right. I am not certain of the date of Dr. Kennon Holter procedure as of the time of this dictation. She was referred back to [place] by Dr. Earleen Newport predominantly for follow-up of the left fourth toe. She still has sutures and stitches in the right TMA site. She states her pain is a lot better. She expresses concern about the condition of the amputation site at the TMA. She is on doxycycline I think prescribed by Dr. Earleen Newport. She is complaining of some pain at night 12/10; I spoke to Dr. Jacqualyn Posey last week. He removed the sutures on the right foot on Monday of this week. She has the area on the left fourth toe just proximal to the PIP and then the right TMA site. She is still on doxycycline and has enough through next week. Unfortunately the TMA site does not look good at all. Both on the lateral and medial part of the incisions are areas that probe to bone. There is purulence over the medial part which I have cultured. We will use silver alginate. Left fourth toe looks somewhat better but there was still exposed bone 12/17; patient has an MRI booked for 12/30. Culture I did last week showed Pseudomonas Serratia and Enterococcus. This was purulent drainage coming out of the medial part of her amputation site. I use cefdinir 300 twice daily for 10 days that started on 12/15. Her x-ray on the right showed limited evaluation for osteomyelitis. The findings could have been postoperative. There was subtle erosion in the distal first and distal fifth metatarsal. An MRI was suggested. On the left she had irregularity of the fourth middle and proximal phalanx consistent with a history of osteomyelitis. I do not know that she has a history of osteomyelitis in this area. She had a  newly defined area on the plantar third toe Electronic Signature(s) Signed: 09/10/2019 5:01:00 PM By: [name] MD Entered By: [name] on 09/10/2019  11:29:29 -------------------------------------------------------------------------------- Physical Exam Details Patient Name: Date of Service: [name] 09/10/2019 9:30 AM Medical Record Number:[id number] Patient Account Number: [id number] Date of Birth/Sex: Treating RN: 09/26/1940 (78 y.o. [name] Primary Care Provider: [name] Other Clinician: Referring Provider: Treating Provider/[name]:Jacory Kamel, [name], [name] Weeks in Treatment: 8 Constitutional Sitting or standing Blood Pressure is within target range for patient.. Pulse regular and within target range for patient.Marland Kitchen Respirations regular, non-labored and within target range.. Temperature is normal and within the target range for the patient.Marland Kitchen Appears in no distress. Notes Wound exam Right TMA site has exposed bone medially necrotic tissue over the fifth fourth and third met heads. Extensive debridement here with scalpel and pickups. I am awaiting the MRI before I consider debridement of the bone over the first metatarsal head. On the left she has exposed bone of the left fourth toe and a new wound on the plantar tip of the third toe. This was debrided with a #5 curette. There is a full-thickness wound here. Electronic Signature(s) Signed: 09/10/2019 5:01:00 PM By: [name] MD Entered By: [name] on 09/10/2019 11:30:46 -------------------------------------------------------------------------------- Physician Orders Details Patient Name: Date of Service: [name] 09/10/2019 9:30 AM Medical Record Number:[id number] Patient Account Number: [id number] Date of Birth/Sex: Treating RN: 10/29/1940 (78 y.o. 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 - Thursday Dressing Change Frequency Change dressing every day. - home health to change twice a week, wound center weekly, and all other days patient to change. Wound Cleansing Clean wound with Wound Cleanser Primary Wound Dressing Wound #1 Left,Dorsal Toe Fourth Calcium Alginate with Silver - ****PLEASE APPLY CALCIUM ALGINATE (NO SILVER) THE DAY OF THE MRI. APPLY BEFORE MRI.*** CANNOT HAVE SILVER ALGINATE ON WITH MRI. MRI STAFF WILL NOT PERFORM. Wound #7 Right Amputation Site - Transmetatarsal Calcium Alginate with Silver - ****PLEASE APPLY CALCIUM ALGINATE (NO SILVER) THE DAY OF THE MRI. APPLY BEFORE MRI.*** CANNOT HAVE SILVER ALGINATE ON WITH MRI. MRI STAFF WILL NOT PERFORM. Wound #8 Left Toe Third Calcium Alginate with Silver - ****PLEASE APPLY CALCIUM ALGINATE (NO SILVER) THE DAY OF THE MRI. APPLY BEFORE MRI.*** CANNOT HAVE SILVER ALGINATE ON WITH MRI. MRI STAFF WILL NOT PERFORM. Secondary Dressing Wound #1 Left,Dorsal Toe Fourth Kerlix/Rolled Gauze Dry Gauze Wound #8 Left Toe Third Kerlix/Rolled Gauze Dry Gauze Wound #7 Right Amputation Site - Transmetatarsal Kerlix/Rolled Gauze ABD pad Edema Control Avoid standing for long periods of time Elevate legs to the level of the heart or above for 30 minutes daily and/or when sitting, a frequency of: - throughout the day. [place] skilled nursing for wound care. - [facility] twice a week. 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 Electronic Signature(s) Signed: 09/10/2019 5:01:00 PM By: [name] MD Signed: 09/10/2019  5:34:18 PM By: Deon Pilling Entered By: Deon Pilling on 09/10/2019 10:56:29 -------------------------------------------------------------------------------- Problem List Details Patient Name: Date of Service: [name] 09/10/2019 9:30 AM Medical Record Number:[id number] Patient Account Number: [id number] Date of Birth/Sex: Treating RN: 08/25/1941 (78 y.o. [name], [name] Primary Care Provider: [name] Other Clinician: Referring Provider: Treating Provider/[name]:Hannah Strader, [name], [name] Weeks in Treatment: 8 Active Problems ICD-10 Evaluated Encounter Code Description Active Date Today Diagnosis E11.621 Type 2 diabetes mellitus with foot ulcer 07/13/2019 No Yes E11.52 Type 2 diabetes mellitus with diabetic peripheral 07/13/2019 No Yes angiopathy with gangrene L97.518 Non-pressure chronic ulcer of other part of right foot 07/13/2019 No Yes with other specified severity L97.528 Non-pressure chronic ulcer of other part of left foot 07/13/2019 No Yes with other specified severity E11.42 Type 2 diabetes mellitus with diabetic polyneuropathy 07/13/2019 No Yes Inactive Problems Resolved Problems Electronic Signature(s) Signed: 09/10/2019 5:01:00 PM By: [name] MD Entered By: [name] on 09/10/2019 11:27:14 -------------------------------------------------------------------------------- Progress Note Details Patient Name: Date of Service: [name] 09/10/2019 9:30 AM Medical Record Number:[id number] Patient Account Number: [id number] Date of Birth/Sex: Treating RN: 1941/04/18 (78 y.o. [name] Primary Care Provider: [name] Other Clinician: Referring Provider: Treating Provider/[name]:Ava Tangney, [name], [name] Weeks in Treatment: 8 Subjective History of Present Illness  (HPI) ADMISSION 07/13/2019 Patient is a 78 year old type II diabetic. She has known PAD. She has been followed by Dr. Jacqualyn Posey of podiatry for blistering areas on her toes dating back to 04/30/2019 which at this was the left fourth toe. By 8/11 she had wounds on the right and left second toes. She underwent arterial studies by Dr. Alvester Chou on 7/31 that showed ABIs on the right of 0.60 TBI of 0.26 on the left ABI of 0.56 and a TBI of 0.25. By 9/10 she had ischemic-looking wounds per Dr. Earleen Newport on the right first, left first second and third. She has been using Medihoney and then mupirocin more recently simply Betadine. The patient underwent angiography by Dr. Gwenlyn Found on 9/21. 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 Noted that there was 1 vessel runoff in the foot via the peroneal. ooOn the left there was an 80% left SFA, 70% mid left SFA. There was a short segment calcified CTO distal less than SFA and above-knee popliteal artery reconstituting with two-vessel runoff. 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 It was felt that she had bilateral SFA disease as well as tibial vessel disease. An attempt was made to revascularize the left SFA but they were unable to cross because of the highly calcified nature of the lesion. 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 She has an area on the medial left fourth toe with raised horned callus on top of this. I am not certain what this represents. With regards to pain she has about a 2-1/2 I will claudication tolerance in the grocery store. She has some pain in night which is relieved by putting her feet down over the side of the bed. She is wearing Nitro-Dur patches on the top of her right foot. Past medical history  includes type 2 diabetes with secondary PAD, neoplasm of the skin, diabetic retinopathy, carotid artery stenosis, hyperlipidemia hypertension. 11/2; the last time the patient was here I spoke to  Dr. Gwenlyn Found about revascularization options on the right. As I understand things currently Dr. Gwenlyn Found spoke with Dr. Trula Slade and ultimately the patient was taken to surgery on 10/27. She had a right iliofemoral endarterectomy with a bovine patch angioplasty. I think the plan now is for her to have a staged intervention on the right SFA by Dr. Alvester Chou. Per the patient's understanding Dr. Gwenlyn Found and Dr. Jacqualyn Posey are waiting to see when the gangrenous toes on the right foot can be amputated. The patient states her pain is a lot better and she is grateful for that. 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 Small area on the left fourth toe. She is using Betadine to the mummified areas on the right and Medihoney on the left 08/27/2019; the last time I saw this patient I discharged her from the clinic. She had been revascularized by Dr. Trula Slade and she had a right iliofemoral endarterectomy with a bovine angioplasty. 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 Jacqualyn Posey of podiatry on 08/07/2019. I note that she also had a intervention by Dr. Gwenlyn Found and he performed a directional atherectomy and drug-coated balloon angioplasty of the SFA and popliteal artery on the right. I am not certain of the date of Dr. Kennon Holter procedure as of the time of this dictation. She was referred back to [place] by Dr. Earleen Newport predominantly for follow-up of the left fourth toe. She still has sutures and stitches in the right TMA site. She states her pain is a lot better. She expresses concern about the condition of the amputation site at the TMA. She is on doxycycline I think prescribed by Dr. Earleen Newport. She is complaining of some pain at night 12/10; I spoke to Dr.  Jacqualyn Posey last week. He removed the sutures on the right foot on Monday of this week. She has the area on the left fourth toe just proximal to the PIP and then the right TMA site. She is still on doxycycline and has enough through next week. Unfortunately the TMA site does not look good at all. Both on the lateral and medial part of the incisions are areas that probe to bone. There is purulence over the medial part which I have cultured. We will use silver alginate. Left fourth toe looks somewhat better but there was still exposed bone 12/17; patient has an MRI booked for 12/30. Culture I did last week showed Pseudomonas Serratia and Enterococcus. This was purulent drainage coming out of the medial part of her amputation site. I use cefdinir 300 twice daily for 10 days that started on 12/15. Her x-ray on the right showed limited evaluation for osteomyelitis. The findings could have been postoperative. There was subtle erosion in the distal first and distal fifth metatarsal. An MRI was suggested. On the left she had irregularity of the fourth middle and proximal phalanx consistent with a history of osteomyelitis. I do not know that she has a history of osteomyelitis in this area. She had a newly defined area on the plantar third toe Objective Constitutional Sitting or standing Blood Pressure is within target range for patient.. Pulse regular and within target range for patient.Marland Kitchen Respirations regular, non-labored and within target range.. Temperature is normal and within the target range for the patient.Marland Kitchen Appears in no distress. Vitals Time Taken: 9:55 AM, Height: 64 in, Weight: 142 lbs, BMI: 24.4, Temperature: 98.2 F, Pulse: 59 bpm, Respiratory Rate: 18 breaths/min, Blood Pressure: 115/54  mmHg. General Notes: Wound exam ooRight TMA site has exposed bone medially necrotic tissue over the fifth fourth and third met heads. Extensive debridement here with scalpel and pickups. I am awaiting the MRI  before I consider debridement of the bone over the first metatarsal head. ooOn the left she has exposed bone of the left fourth toe and a new wound on the plantar tip of the third toe. This was debrided with a #5 curette. There is a full-thickness wound here. Integumentary (Hair, Skin) Wound #1 status is Open. Original cause of wound was Blister. The wound is located on the Left,Dorsal Toe Fourth. The wound measures 0.6cm length x 0.6cm width x 0.3cm depth; 0.283cm^2 area and 0.085cm^3 volume. There is bone and Fat Layer (Subcutaneous Tissue) Exposed exposed. There is no tunneling or undermining noted. There is a small amount of serosanguineous drainage noted. The wound margin is distinct with the outline attached to the wound base. There is medium (34-66%) pink granulation within the wound bed. There is a medium (34-66%) amount of necrotic tissue within the wound bed including Eschar and Adherent Slough. Wound #7 status is Open. Original cause of wound was Surgical Injury. 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 The wound measures 1.5cm length x 9.2cm width x 1.3cm depth; 10.838cm^2 area and 14.09cm^3 volume. There is bone and Fat Layer (Subcutaneous Tissue) Exposed exposed. There is no tunneling or undermining noted. There is a medium amount of serous drainage noted. The wound margin is distinct with the outline attached to the wound base. There is no granulation within the wound bed. There is a large (67- 100%) amount of necrotic tissue within the wound bed including Adherent Slough. Wound #8 status is Open. Original cause of wound was Gradually Appeared. The wound is located on the Left Toe Third. The wound measures 0.8cm length x 0.7cm width x 0.1cm depth; 0.44cm^2 area and 0.044cm^3 volume. There is no tunneling or undermining noted. There is a medium amount of serosanguineous drainage noted. The wound margin is distinct with the outline attached to the wound  base. There is medium (34-66%) red, pink granulation within the wound bed. There is a medium (34-66%) amount of necrotic tissue within the wound bed including Adherent Slough. 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 There was a Excisional Skin/Subcutaneous Tissue Debridement with a total area of 7.5 sq cm performed by Ricard Dillon., MD. With the following instrument(s): Blade, and Forceps to remove Non-Viable tissue/material. Material removed includes Fat, Eschar, Subcutaneous Tissue, Slough, Skin: Dermis, and Fibrin/Exudate after achieving pain control using Lidocaine 4% Topical Solution. A time out was conducted at 10:40, prior to the start of the procedure. A Minimum amount of bleeding was controlled with Pressure. The procedure was tolerated well with a pain level of 0 throughout and a pain level of 0 following the procedure. Post Debridement Measurements: 1.5cm length x 9.2cm width x 1.3cm depth; 14.09cm^3 volume. Character of Wound/Ulcer Post Debridement requires further debridement. Severity of Tissue Post Debridement is: Bone  involvement without necrosis. Post procedure Diagnosis Wound #7: Same as Pre-Procedure Plan Follow-up Appointments: Return Appointment in 2 weeks. - Thursday Dressing Change Frequency: Change dressing every day. - home health to change twice a week, wound center weekly, and all other days  patient to change. Wound Cleansing: Clean wound with Wound Cleanser Primary Wound Dressing: Wound #1 Left,Dorsal Toe Fourth: Calcium Alginate with Silver - ****PLEASE APPLY CALCIUM ALGINATE (NO SILVER) THE DAY OF THE MRI. APPLY BEFORE MRI.*** CANNOT HAVE SILVER ALGINATE ON WITH MRI. MRI STAFF WILL NOT PERFORM. Wound #7 Right Amputation Site - Transmetatarsal: Calcium Alginate with Silver - ****PLEASE APPLY CALCIUM ALGINATE (NO SILVER) THE DAY OF THE MRI. APPLY BEFORE MRI.*** CANNOT HAVE SILVER ALGINATE ON WITH MRI. MRI STAFF WILL NOT PERFORM. Wound #8 Left Toe Third: Calcium Alginate with Silver - ****PLEASE APPLY CALCIUM ALGINATE (NO SILVER) THE DAY OF THE MRI. APPLY BEFORE MRI.*** CANNOT HAVE SILVER ALGINATE ON WITH MRI. MRI STAFF WILL NOT PERFORM. Secondary Dressing: Wound #1 Left,Dorsal Toe Fourth: Kerlix/Rolled Gauze Dry Gauze Wound #8 Left Toe Third: Kerlix/Rolled Gauze Dry Gauze Wound #7 Right Amputation Site - Transmetatarsal: Kerlix/Rolled Gauze ABD pad Edema Control: Avoid standing for long periods of time Elevate legs to the level of the heart or above for 30 minutes daily and/or when sitting, a frequency of: - throughout the day. Home Health: [facility] for [facility] care. - [facility] twice a week. 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 1. We are going to continue with silver alginate. 2. She has home health changing the dressing 3. MRI for 12/30. Based on this I hope to determine whether the patient has osteomyelitis or not on the right 4. 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 New wound on the left third toe 6. She does not see Dr. Gwenlyn Found again until January. I will try to look over the angiogram Electronic Signature(s) Signed: 09/10/2019 5:01:00 PM By: [name] MD Entered By: [name] on 09/10/2019  11:32:14 -------------------------------------------------------------------------------- SuperBill Details Patient Name: Date of Service: [name] 09/10/2019 Medical Record Number:[id number] Patient Account Number: [id number] Date of Birth/Sex: Treating RN: 20-Aug-1941 (78 y.o. 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 Facility Procedures The patient participates with Medicare or their insurance follows the Medicare Facility Guidelines: CPT4 Code Description Modifier Quantity [id number] 11042 - DEB SUBQ TISSUE 20 SQ CM/< 1 ICD-10 Diagnosis Description E11.621 Type 2 diabetes mellitus with  foot ulcer L97.518 Non-pressure chronic ulcer of other part of right foot with other specified severity L97.528 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ity Physician Procedures CPT4 Code Description: [id number] 11042 - WC PHYS SUBQ TISS 20 SQ CM ICD-10 Diagnosis Description 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

## 2019-09-10 NOTE — Progress Notes (Addendum)
AISA, SCHOEPPNER ([id number]) Visit Report for 09/10/2019 Arrival Information Details Patient Name: Date of Service: [name], [name] 09/10/2019 9:30 AM Medical Record Number:[id number] Patient Account Number: [id number] Date of Birth/Sex: Treating RN: [birth year] (78 y.o. [name] Primary Care Kassie Keng: [name] Other Clinician: Referring Nashaun Hillmer: Treating Peyton Rossner/[name]:[name], [name], [name] Weeks in Treatment: 8 Visit Information History Since Last Visit All ordered tests and consults were completed: No Patient Arrived: [name] Added or deleted any medications: No Arrival Time: 09:51 Any new allergies or adverse reactions: No Accompanied By: self Had a fall or experienced change in No Transfer Assistance: None activities of daily living that may affect Patient Identification Verified: Yes risk of falls: Secondary Verification Process Yes Signs or symptoms of abuse/neglect since last No Completed: visito Patient Requires Transmission- No Hospitalized since last visit: No Based Precautions: Implantable device outside of the clinic excluding No Patient Has Alerts: Yes cellular tissue based products placed in the center Patient Alerts: Right ABI .62 TBI since last visit: refused Has Dressing in Place as Prescribed: Yes Left ABI .60 TBI Has Compression in Place as Prescribed: Yes .14 Pain Present Now: No Electronic Signature(s) Signed: 09/10/2019 4:51:50 PM By: Carlene Coria RN Entered By: Carlene Coria on 09/10/2019 09:55:44 -------------------------------------------------------------------------------- Encounter Discharge Information Details Patient Name: Date of Service: [name] 09/10/2019 9:30 AM Medical Record Number:[id number] Patient Account Number: [id number] Date of Birth/Sex: Treating RN: 06-09-1941 (78 y.o. [name] Primary Care Sonya Gunnoe: [name] Other Clinician: Referring Geovanni Rahming: Treating  Chesney Suares/[name]:[name], [name], [name] Weeks in Treatment: 8 Encounter Discharge Information Items Post Procedure Vitals Discharge Condition: Stable Temperature (F): 98.2 Ambulatory Status: Walker Pulse (bpm): 59 Discharge Destination: Home Respiratory Rate (breaths/min): 18 Transportation: Private Auto Blood Pressure (mmHg): 115/54 Accompanied By: self Schedule Follow-up Appointment: Yes Clinical Summary of Care: Patient Declined Electronic Signature(s) Signed: 09/10/2019 4:51:50 PM By: Carlene Coria RN Entered By: Carlene Coria on 09/10/2019 11:21:32 -------------------------------------------------------------------------------- Lower Extremity Assessment Details Patient Name: Date of Service: [name], [name] 09/10/2019 9:30 AM Medical Record Number:[id number] Patient Account Number: [id number] Date of Birth/Sex: Treating RN: 24-Jan-1941 (78 y.o. [name] Primary Care Mysty Kielty: [name] Other Clinician: Referring Zohair Epp: Treating Kysen Wetherington/[name]:[name], [name], [name] Weeks in Treatment: 8 Edema Assessment Assessed: [Left: No] [Right: No] Edema: [Left: No] [Right: No] Calf Left: Right: Point of Measurement: 39 cm From Medial Instep 29 cm 27 cm Ankle Left: Right: Point of Measurement: 10 cm From Medial Instep 19 cm 19 cm Electronic Signature(s) Signed: 09/10/2019 4:51:50 PM By: Carlene Coria RN Entered By: Carlene Coria on 09/10/2019 10:06:57 -------------------------------------------------------------------------------- Multi Wound Chart Details Patient Name: Date of Service: [name] 09/10/2019 9:30 AM Medical Record Number:[id number] Patient Account Number: [id number] Date of Birth/Sex: Treating RN: 22-Sep-1941 (78 y.o. [name] Primary Care Justino Boze: [name] Other Clinician: Referring Lyrica Mcclarty: Treating Aoki Wedemeyer/[name]:[name], [name], [name] Weeks in Treatment: 8 Vital  Signs Height(in): 64 Pulse(bpm): 1 Weight(lbs): 142 Blood Pressure(mmHg): 115/54 Body Mass Index(BMI): 24 Temperature(F): 98.2 Respiratory 18 Rate(breaths/min): Photos: [1:No Photos] [7:No Photos] [8:No Photos] Wound Location: [1:Left Toe Fourth - DorsalRight Amputation Site -] [7:Transmetatarsal] [8:Left Toe Third] Wounding Event: [1:Blister] [7:Surgical Injury] [8:Gradually Appeared] 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] [7:Lower Extremity] [8:Lower Extremity] Secondary Etiology: [1:Arterial Insufficiency Ulcer N/A] [8:N/A] Comorbid History: [1:Deep Vein Thrombosis, Hypertension, Peripheral Hypertension, Peripheral Hypertension, Peripheral Arterial Disease, Type II Diabetes] [7:Deep Vein Thrombosis, Arterial Disease, Type II Arterial Disease, Type II Diabetes] [8:Deep Vein  Thrombosis, Diabetes] Date Acquired: [1:09/16/2018] [7:08/07/2019] [8:09/10/2019] Weeks of Treatment: [1:8] [7:2] [8:0] Wound Status: [1:Open] [7:Open] [8:Open] Measurements L x W x D 0.6x0.6x0.3 [7:1.5x9.2x1.3] [8:0.8x0.7x0.1] (cm) Area (cm) : [1:0.283] [7:10.838] [8:0.44] Volume (cm) : [1:0.085] [7:14.09] [8:0.044] % Reduction in Area: [1:60.00%] [7:-4.50%] [8:N/A] % Reduction in Volume: -19.70% [7:-1258.70%] [8:N/A] Classification: [1:Grade 2] [7:Grade 4] [8:Grade 2] Exudate Amount: [1:Small] [7:Medium] [8:Medium] Exudate Type: [1:Serosanguineous] [7:Serous] [8:Serosanguineous] Exudate Color: [1:red, brown] [7:amber] [8:red, brown] Wound Margin: [1:Distinct, outline attached Distinct, outline attached Distinct, outline attached] Granulation Amount: [1:Medium (34-66%)] [7:None Present (0%)] [8:Medium (34-66%)] Granulation Quality: [1:Pink] [7:N/A] [8:Red, Pink] Necrotic Amount: [1:Medium (34-66%)] [7:Large (67-100%)] [8:Medium (34-66%)] Necrotic Tissue: [1:Eschar, Adherent Slough Adherent Slough] [8:Adherent  Slough] Exposed Structures: [1:Fat Layer (Subcutaneous Fat Layer (Subcutaneous Fascia: No Tissue) Exposed: Yes Bone: Yes Fascia: No Tendon: No Muscle: No Joint: No] [7:Tissue) Exposed: Yes Bone: Yes Fascia: No Tendon: No Muscle: No Joint: No] [8:Fat Layer (Subcutaneous Tissue)  Exposed: No Tendon: No Muscle: No Joint: No Bone: No] Epithelialization: [1:Small (1-33%)] [7:None] [8:None] Debridement: [1:N/A] [7:Debridement - Excisional N/A] Pre-procedure [1:N/A] [7:10:40] [8:N/A] Verification/Time Out Taken: Pain Control: [1:N/A] [7:Lidocaine 4% Topical Solution] [8:N/A] Tissue Debrided: [1:N/A] [7:Necrotic/Eschar, Fat, Subcutaneous, Slough] [8:N/A] Level: [1:N/A] [7:Skin/Subcutaneous Tissue N/A] Debridement Area (sq cm):N/A [7:7.5] [8:N/A] Instrument: [1:N/A] [7:Blade, Forceps] [8:N/A] Bleeding: [1:N/A] [7:Minimum] [8:N/A] Hemostasis Achieved: [1:N/A] [7:Pressure] [8:N/A] Procedural Pain: [1:N/A] [7:0] [8:N/A] Post Procedural Pain: [1:N/A] [7:0] [8:N/A] Debridement Treatment [1:N/A] [7:Procedure was tolerated] [8:N/A] Response: [7:well] Post Debridement [1:N/A] [7:1.5x9.2x1.3] [8:N/A] Measurements L x W x D (cm) Post Debridement [1:N/A] [7:14.09] [8:N/A] Volume: (cm) Procedures Performed: [1:N/A] [7:Debridement] [8:N/A] Treatment Notes Wound #1 (Left, Dorsal Toe Fourth) 1. Cleanse With Wound Cleanser Soap and water 3. Primary Dressing Applied Calcium Alginate Ag 4. Secondary Dressing Dry Gauze 5. Secured With Tape Notes netting 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 Cleanse With Wound Cleanser Soap and water 3. Primary Dressing Applied Calcium Alginate Ag 4. Secondary Dressing Dry Gauze Roll Gauze 5. Secured With Tape Notes netting Wound #8 (Left Toe Third) 1. Cleanse With Wound Cleanser Soap and water 3. Primary Dressing Applied Calcium Alginate Ag 4. Secondary Dressing Dry Gauze 5. Secured With Tape Notes Careers adviser) Signed: 09/10/2019 5:01:00 PM By: [name] MD Signed: 09/10/2019 5:34:18 PM By: Deon Pilling Entered By: [name] on 09/10/2019 11:27:26 -------------------------------------------------------------------------------- Multi-Disciplinary Care Plan Details Patient Name: Date of Service: [name], [name] 09/10/2019 9:30 AM Medical Record Number:[id number] Patient Account Number: [id number] Date of Birth/Sex: Treating RN: 1940/10/12 (78 y.o. [name] Primary Care Leondre Taul: [name] Other Clinician: Referring Lanny Lipkin: Treating Kinesha Auten/[name]:[name], [name], [name] Weeks in Treatment: 8 Active Inactive Abuse / Safety / Falls / Self Care Management Nursing Diagnoses: Potential for falls Goals: Patient will remain injury free related to falls Date Initiated: 08/27/2019 Target Resolution Date: 10/02/2019 Goal Status: Active Interventions: Provide education on fall prevention Notes: Pain, Acute or Chronic Nursing Diagnoses: Pain, acute or chronic: actual or potential Potential alteration in comfort, pain Goals: Patient will verbalize adequate pain control and receive pain control interventions during procedures as needed Date Initiated: 08/27/2019 Target Resolution Date: 10/02/2019 Goal Status: Active Interventions: Encourage patient to take pain medications as prescribed Provide education on pain management Reposition patient for comfort Notes: Electronic Signature(s) Signed: 09/10/2019 5:34:18 PM By: Deon Pilling Entered By: Deon Pilling on 09/10/2019 10:29:39 -------------------------------------------------------------------------------- Pain Assessment Details Patient Name: Date of Service: [name], [name] 09/10/2019 9:30 AM Medical Record Number:[id number] Patient Account Number: [id number] Date of Birth/Sex: Treating  RN: 10-30-1940 (78 y.o. [name] Primary Care Evalena Fujii: [name] Other  Clinician: Referring Shanele Nissan: Treating Chatara Lucente/[name]:[name], [name], [name] Weeks in Treatment: 8 Active Problems Location of Pain Severity and Description of Pain Patient Has Paino No Site Locations Pain Management and Medication Current Pain Management: Electronic Signature(s) Signed: 09/10/2019 4:51:50 PM By: Carlene Coria RN Entered By: Carlene Coria on 09/10/2019 09:56:37 -------------------------------------------------------------------------------- Patient/Caregiver Education Details Patient Name: Date of Service: [name] 12/17/2020andnbsp9:30 AM Medical Record Patient Account Number: [id number] Number: Treating RN: Deon Pilling Date of Birth/Gender: 01-01-1941 (78 y.o. F) Other Clinician: Primary Care Physician: Verna Czech Referring Physician: Physician/[name]: Darl Pikes in Treatment: 8 Education Assessment Education Provided To: Patient Education Topics Provided Pain: Handouts: A Guide to Pain Control Methods: Explain/Verbal Responses: Reinforcements needed Electronic Signature(s) Signed: 09/10/2019 5:34:18 PM By: Deon Pilling Entered By: Deon Pilling on 09/10/2019 10:29:51 -------------------------------------------------------------------------------- Wound Assessment Details Patient Name: Date of Service: [name], [name] 09/10/2019 9:30 AM Medical Record Number:[id number] Patient Account Number: [id number] Date of Birth/Sex: Treating RN: January 13, 1941 (78 y.o. [name] Primary Care Tiffanie Blassingame: [name] Other Clinician: Referring Roxie Kreeger: Treating Unita Detamore/[name]:[name], [name], [name] Weeks in Treatment: 8 Wound Status Wound Number: 1 Primary Diabetic Wound/Ulcer of the Lower Extremity Etiology: Wound Location: Left Toe Fourth - Dorsal Secondary Arterial Insufficiency Ulcer Wounding Event: Blister Etiology: Date Acquired:  09/16/2018 Wound Open Weeks Of Treatment: 8 Status: Clustered Wound: No Comorbid Deep Vein Thrombosis, Hypertension, History: Peripheral Arterial Disease, Type II Diabetes Photos Wound Measurements Length: (cm) 0.6 Width: (cm) 0.6 Depth: (cm) 0.3 Area: (cm) 0.283 Volume: (cm) 0.085 Wound Description Classification: Grade 2 Wound Margin: Distinct, outline attached Exudate Amount: Small Exudate Type: Serosanguineous Exudate Color: red, brown Wound Bed Granulation Amount: Medium (34-66%) Granulation Quality: Pink Necrotic Amount: Medium (34-66%) Necrotic Quality: Eschar, Adherent Slough Foul Odor After Cleansing: No Slough/Fibrino Yes Exposed Structure Fascia Exposed: N Fat Layer (Subcutaneous Tissue) Exposed: Y Tendon Exposed: N Muscle Exposed: N Joint Exposed: N Bone Exposed: Y % Reduction in Area: 60% % Reduction in Volume: -19.7% Epithelialization: Small (1-33%) Tunneling: No Undermining: No o es o o o es Treatment Notes Wound #1 (Left, Dorsal Toe Fourth) 1. Cleanse With Wound Cleanser Soap and water 3. Primary Dressing Applied Calcium Alginate Ag 4. Secondary Dressing Dry Gauze 5. Secured With Tape Notes Horticulturist, commercial) Signed: 09/11/2019 5:07:56 PM By: [name] EMT/HBOT Signed: 09/11/2019 5:51:21 PM By: Carlene Coria RN Previous Signature: 09/10/2019 4:51:50 PM Version By: Carlene Coria RN Entered By: [name] on 09/11/2019 13:56:26 -------------------------------------------------------------------------------- Wound Assessment Details Patient Name: Date of Service: [name] 09/10/2019 9:30 AM Medical Record Number:[id number] Patient Account Number: [id number] Date of Birth/Sex: Treating RN: Oct 09, 1940 (78 y.o. [name] Primary Care Kellsie Grindle: [name] Other Clinician: Referring Quintel Mccalla: Treating Tekeyah Santiago/[name]:[name], [name], [name] Weeks in Treatment: 8 Wound  Status Wound Number: 7 Primary Diabetic Wound/Ulcer of the Lower Extremity Etiology: Wound Location: Right Amputation Site - Transmetatarsal Wound Open Status: Wounding Event: Surgical Injury Comorbid Deep Vein Thrombosis, Hypertension, Date Acquired: 08/07/2019 Date Acquired: 08/07/2019 History: Peripheral Arterial Disease, Type II Diabetes Weeks Of Treatment: 2 Clustered Wound: No Photos Wound Measurements Length: (cm) 1.5 Width: (cm) 9.2 Depth: (cm) 1.3 Area: (cm) 10.838 Volume: (cm) 14.09 Wound Description Classification: Grade 4 Wound Margin: Distinct, outline attached Exudate Amount: Medium Exudate Type: Serous Exudate Color: amber Wound Bed Granulation Amount: None Present (0%) Necrotic Amount: Large (67-100%) Necrotic Quality: Adherent Slough After Cleansing: No brino Yes Exposed  Structure posed: No (Subcutaneous Tissue) Exposed: Yes posed: No posed: No osed: No sed: Yes % Reduction in Area: -4.5% % Reduction in Volume: -1258.7% Epithelialization: None Tunneling: No Undermining: No Foul Odor Slough/Fi Fascia Ex Fat Layer Tendon Ex Muscle Ex Joint Exp Bone Expo Treatment Notes 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 Cleanse With Wound Cleanser Soap and water 3. Primary Dressing Applied Calcium Alginate Ag 4. Secondary Dressing Dry Gauze Roll Gauze 5. Secured With Tape Notes Horticulturist, commercial) Signed: 09/11/2019 5:07:56 PM By: [name] EMT/HBOT Signed: 09/11/2019 5:51:21 PM By: Carlene Coria RN Previous Signature: 09/10/2019 4:51:50 PM Version By: Carlene Coria RN Entered By: [name] on 09/11/2019 13:56:02 -------------------------------------------------------------------------------- Wound Assessment Details Patient Name: Date of Service: [name] 09/10/2019 9:30 AM Medical Record Number:[id number] Patient Account Number: [id number] Date of Birth/Sex: Treating RN: [birth year] (78 y.o. [name], [name] Primary Care Dayle Mcnerney: [name] Other Clinician: Referring Nahal Wanless: Treating Andalyn Heckstall/[name]:[name], [name], [name] Weeks in Treatment: 8 Wound Status Wound Number: 8 Primary Diabetic Wound/Ulcer of the Lower Extremity Etiology: Wound Location: Left Toe Third Wound Open Wounding Event: Gradually Appeared Status: Date Acquired: 09/10/2019 Comorbid Deep Vein Thrombosis, Hypertension, Weeks Of Treatment: 0 History: Peripheral Arterial Disease, Type II Diabetes Clustered Wound: No Photos Wound Measurements Length: (cm) 0.8 % Reduction i Width: (cm) 0.7 % Reduction i Depth: (cm) 0.1 Epithelializa Area: (cm) 0.44 Tunneling: Volume: (cm) 0.044 Undermining: Wound Description Classification: Grade 2 Foul Odor Af Wound Margin: Distinct, outline attached Slough/Fibri Exudate Amount: Medium Exudate Type: Serosanguineous Exudate Color: red, brown Wound Bed Granulation Amount: Medium (34-66%) Granulation Quality: Red, Pink Fascia Expose Necrotic Amount: Medium (34-66%) Fat Layer (Su Necrotic Quality: Adherent Slough Tendon Expose Muscle Expose Joint Exposed Bone Exposed: Treatment Notes Wound #8 (Left Toe Third) 1. Cleanse With Wound Cleanser Soap and water 3. Primary Dressing Applied Calcium Alginate Ag 4. Secondary Dressing Dry Gauze 5. Secured With Tape ter Cleansing: No no Yes Exposed Structure d: No bcutaneous Tissue) Exposed: No d: No d: No : No No n Area: 0% n Volume: 0% tion: None No No Notes netting Electronic Signature(s) Signed: 09/11/2019 5:07:56 PM By: [name] EMT/HBOT Signed: 09/11/2019 5:50:42 PM By: Deon Pilling Previous Signature: 09/10/2019 5:34:18 PM Version By: Deon Pilling Entered By: [name] on 09/11/2019 13:59:28 -------------------------------------------------------------------------------- Vitals Details Patient Name: Date of Service: [name] 09/10/2019 9:30  AM Medical Record Number:[id number] Patient Account Number: [id number] Date of Birth/Sex: Treating RN: 1941/06/02 (78 y.o. [name] Primary Care Reia Viernes: [name] Other Clinician: Referring Elber Galyean: Treating Katalyn Matin/[name]:[name], [name], [name] Weeks in Treatment: 8 Vital Signs Time Taken: 09:55 Temperature (F): 98.2 Height (in): 64 Pulse (bpm): 59 Weight (lbs): 142 Respiratory Rate (breaths/min): 18 Body Mass Index (BMI): 24.4 Blood Pressure (mmHg): 115/54 Reference Range: 80 - 120 mg / dl Electronic Signature(s) Signed: 09/10/2019 4:51:50 PM By: Carlene Coria RN Entered By: Carlene Coria on 09/10/2019 09:56:30

## 2019-09-11 DIAGNOSIS — I10 Essential (primary) hypertension: Secondary | ICD-10-CM | POA: Diagnosis not present

## 2019-09-11 DIAGNOSIS — Z4781 Encounter for orthopedic aftercare following surgical amputation: Secondary | ICD-10-CM | POA: Diagnosis not present

## 2019-09-11 DIAGNOSIS — E785 Hyperlipidemia, unspecified: Secondary | ICD-10-CM | POA: Diagnosis not present

## 2019-09-11 DIAGNOSIS — D649 Anemia, unspecified: Secondary | ICD-10-CM | POA: Diagnosis not present

## 2019-09-11 DIAGNOSIS — E1152 Type 2 diabetes mellitus with diabetic peripheral angiopathy with gangrene: Secondary | ICD-10-CM | POA: Diagnosis not present

## 2019-09-11 DIAGNOSIS — K219 Gastro-esophageal reflux disease without esophagitis: Secondary | ICD-10-CM | POA: Diagnosis not present

## 2019-09-14 DIAGNOSIS — Z4781 Encounter for orthopedic aftercare following surgical amputation: Secondary | ICD-10-CM | POA: Diagnosis not present

## 2019-09-14 DIAGNOSIS — K219 Gastro-esophageal reflux disease without esophagitis: Secondary | ICD-10-CM | POA: Diagnosis not present

## 2019-09-14 DIAGNOSIS — I10 Essential (primary) hypertension: Secondary | ICD-10-CM | POA: Diagnosis not present

## 2019-09-14 DIAGNOSIS — D649 Anemia, unspecified: Secondary | ICD-10-CM | POA: Diagnosis not present

## 2019-09-14 DIAGNOSIS — E1152 Type 2 diabetes mellitus with diabetic peripheral angiopathy with gangrene: Secondary | ICD-10-CM | POA: Diagnosis not present

## 2019-09-14 DIAGNOSIS — E785 Hyperlipidemia, unspecified: Secondary | ICD-10-CM | POA: Diagnosis not present

## 2019-09-15 ENCOUNTER — Telehealth: Payer: Self-pay | Admitting: *Deleted

## 2019-09-15 DIAGNOSIS — K219 Gastro-esophageal reflux disease without esophagitis: Secondary | ICD-10-CM | POA: Diagnosis not present

## 2019-09-15 DIAGNOSIS — D649 Anemia, unspecified: Secondary | ICD-10-CM | POA: Diagnosis not present

## 2019-09-15 DIAGNOSIS — I10 Essential (primary) hypertension: Secondary | ICD-10-CM | POA: Diagnosis not present

## 2019-09-15 DIAGNOSIS — E785 Hyperlipidemia, unspecified: Secondary | ICD-10-CM | POA: Diagnosis not present

## 2019-09-15 DIAGNOSIS — E1152 Type 2 diabetes mellitus with diabetic peripheral angiopathy with gangrene: Secondary | ICD-10-CM | POA: Diagnosis not present

## 2019-09-15 DIAGNOSIS — Z4781 Encounter for orthopedic aftercare following surgical amputation: Secondary | ICD-10-CM | POA: Diagnosis not present

## 2019-09-15 NOTE — Telephone Encounter (Signed)
[name], [name] states they are discharging early due to pt's decrease weight bearing due to wound healing, and may be re-established once weight bearing status increases.

## 2019-09-17 DIAGNOSIS — Z89431 Acquired absence of right foot: Secondary | ICD-10-CM | POA: Diagnosis not present

## 2019-09-17 DIAGNOSIS — Z4781 Encounter for orthopedic aftercare following surgical amputation: Secondary | ICD-10-CM | POA: Diagnosis not present

## 2019-09-17 DIAGNOSIS — M1990 Unspecified osteoarthritis, unspecified site: Secondary | ICD-10-CM | POA: Diagnosis not present

## 2019-09-17 DIAGNOSIS — Z7982 Long term (current) use of aspirin: Secondary | ICD-10-CM | POA: Diagnosis not present

## 2019-09-17 DIAGNOSIS — E785 Hyperlipidemia, unspecified: Secondary | ICD-10-CM | POA: Diagnosis not present

## 2019-09-17 DIAGNOSIS — Z85828 Personal history of other malignant neoplasm of skin: Secondary | ICD-10-CM | POA: Diagnosis not present

## 2019-09-17 DIAGNOSIS — E1152 Type 2 diabetes mellitus with diabetic peripheral angiopathy with gangrene: Secondary | ICD-10-CM | POA: Diagnosis not present

## 2019-09-17 DIAGNOSIS — Z7902 Long term (current) use of antithrombotics/antiplatelets: Secondary | ICD-10-CM | POA: Diagnosis not present

## 2019-09-17 DIAGNOSIS — I10 Essential (primary) hypertension: Secondary | ICD-10-CM | POA: Diagnosis not present

## 2019-09-17 DIAGNOSIS — K219 Gastro-esophageal reflux disease without esophagitis: Secondary | ICD-10-CM | POA: Diagnosis not present

## 2019-09-17 DIAGNOSIS — D649 Anemia, unspecified: Secondary | ICD-10-CM | POA: Diagnosis not present

## 2019-09-17 DIAGNOSIS — Z7984 Long term (current) use of oral hypoglycemic drugs: Secondary | ICD-10-CM | POA: Diagnosis not present

## 2019-09-17 DIAGNOSIS — Z79899 Other long term (current) drug therapy: Secondary | ICD-10-CM | POA: Diagnosis not present

## 2019-09-20 ENCOUNTER — Other Ambulatory Visit: Payer: Self-pay | Admitting: Cardiovascular Disease

## 2019-09-20 ENCOUNTER — Other Ambulatory Visit: Payer: Self-pay | Admitting: Endocrinology

## 2019-09-22 DIAGNOSIS — E785 Hyperlipidemia, unspecified: Secondary | ICD-10-CM | POA: Diagnosis not present

## 2019-09-22 DIAGNOSIS — K219 Gastro-esophageal reflux disease without esophagitis: Secondary | ICD-10-CM | POA: Diagnosis not present

## 2019-09-22 DIAGNOSIS — D649 Anemia, unspecified: Secondary | ICD-10-CM | POA: Diagnosis not present

## 2019-09-22 DIAGNOSIS — E1152 Type 2 diabetes mellitus with diabetic peripheral angiopathy with gangrene: Secondary | ICD-10-CM | POA: Diagnosis not present

## 2019-09-22 DIAGNOSIS — Z4781 Encounter for orthopedic aftercare following surgical amputation: Secondary | ICD-10-CM | POA: Diagnosis not present

## 2019-09-22 DIAGNOSIS — I10 Essential (primary) hypertension: Secondary | ICD-10-CM | POA: Diagnosis not present

## 2019-09-23 ENCOUNTER — Ambulatory Visit (HOSPITAL_COMMUNITY)
Admission: RE | Admit: 2019-09-23 | Discharge: 2019-09-23 | Disposition: A | Discharge status: Home / Self Care | Payer: Medicare Other | Source: Ambulatory Visit | Attending: Internal Medicine | Admitting: Internal Medicine

## 2019-09-23 ENCOUNTER — Other Ambulatory Visit: Payer: Self-pay

## 2019-09-23 DIAGNOSIS — S91301A Unspecified open wound, right foot, initial encounter: Secondary | ICD-10-CM | POA: Diagnosis not present

## 2019-09-23 DIAGNOSIS — L97509 Non-pressure chronic ulcer of other part of unspecified foot with unspecified severity: Secondary | ICD-10-CM | POA: Diagnosis not present

## 2019-09-23 DIAGNOSIS — E13621 Other specified diabetes mellitus with foot ulcer: Secondary | ICD-10-CM | POA: Insufficient documentation

## 2019-09-23 LAB — CREATININE, SERUM
Creatinine, Ser: 0.92 mg/dL (ref 0.44–1.00)
GFR calc Af Amer: >60 mL/min (ref >60)
GFR calc non Af Amer: >60 mL/min (ref >60)

## 2019-09-23 MED ORDER — GADOBUTROL 1 MMOL/ML IV SOLN
6.0000 mL | Freq: Once | INTRAVENOUS | Status: AC | PRN
  Administered 2019-09-23: 6 mL via INTRAVENOUS

## 2019-09-24 ENCOUNTER — Encounter (HOSPITAL_BASED_OUTPATIENT_CLINIC_OR_DEPARTMENT_OTHER): Payer: Medicare Other | Admitting: Internal Medicine

## 2019-09-24 DIAGNOSIS — Z4781 Encounter for orthopedic aftercare following surgical amputation: Secondary | ICD-10-CM | POA: Diagnosis not present

## 2019-09-24 DIAGNOSIS — Z95820 Peripheral vascular angioplasty status with implants and grafts: Secondary | ICD-10-CM | POA: Diagnosis not present

## 2019-09-24 DIAGNOSIS — E1152 Type 2 diabetes mellitus with diabetic peripheral angiopathy with gangrene: Secondary | ICD-10-CM | POA: Diagnosis not present

## 2019-09-24 DIAGNOSIS — L97528 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ity: Secondary | ICD-10-CM | POA: Diagnosis not present

## 2019-09-24 DIAGNOSIS — E11621 Type 2 diabetes mellitus with foot ulcer: Secondary | ICD-10-CM | POA: Diagnosis not present

## 2019-09-24 DIAGNOSIS — K219 Gastro-esophageal reflux disease without esophagitis: Secondary | ICD-10-CM | POA: Diagnosis not present

## 2019-09-24 DIAGNOSIS — T8189XA Other complications of procedures, not elsewhere classified, initial encounter: Secondary | ICD-10-CM | POA: Diagnosis not present

## 2019-09-24 DIAGNOSIS — I10 Essential (primary) hypertension: Secondary | ICD-10-CM | POA: Diagnosis not present

## 2019-09-24 DIAGNOSIS — Z89421 Acquired absence of other right toe(s): Secondary | ICD-10-CM | POA: Diagnosis not present

## 2019-09-24 DIAGNOSIS — D649 Anemia, unspecified: Secondary | ICD-10-CM | POA: Diagnosis not present

## 2019-09-24 DIAGNOSIS — L97512 Non-pressure chronic ulcer of other part of right foot with fat layer exposed: Secondary | ICD-10-CM | POA: Diagnosis not present

## 2019-09-24 DIAGNOSIS — M86671 Other chronic osteomyelitis, right ankle and foot: Secondary | ICD-10-CM | POA: Diagnosis not present

## 2019-09-24 DIAGNOSIS — E785 Hyperlipidemia, unspecified: Secondary | ICD-10-CM | POA: Diagnosis not present

## 2019-09-24 DIAGNOSIS — L97518 Non-pressure chronic ulcer of other part of right foot with other specified severity: Secondary | ICD-10-CM | POA: Diagnosis not present

## 2019-09-24 NOTE — Progress Notes (Signed)
[name] ([id number]) Visit Report for 09/24/2019 Debridement Details Patient Name: Date of Service: [name] 09/24/2019 8:45 AM Medical Record Number:[id number] Patient Account Number: [id number] Date of Birth/Sex: Treating RN: [birth year] (78 y.o. [name], [name] Primary Care Provider: [name] Other Clinician: Referring Provider: Treating Provider/[name]:Luciel Brickman, [name], [name] Weeks in Treatment: 10 Debridement Performed for Wound #7 Right Amputation Site - Transmetatarsal Assessment: Performed By: Physician Ricard Dillon., MD Debridement Type: Debridement Severity of Tissue Pre Fat layer exposed Debridement: Level of Consciousness (Pre- Awake and Alert procedure): Pre-procedure Verification/Time Out Taken: Yes - 09:40 Start Time: 09:41 Pain Control: Other : benzocaine 20% Total Area Debrided (L x W): 1.5 (cm) x 5 (cm) = 7.5 (cm) Tissue and other material Viable, Non-Viable, Fat, Slough, Subcutaneous, Skin: Dermis , Fibrin/Exudate, Slough debrided: Level: Skin/Subcutaneous Tissue Debridement Description: Excisional Instrument: Curette Bleeding: Minimum Hemostasis Achieved: Pressure End Time: 09:50 Procedural Pain: 0 Post Procedural Pain: 0 Response to Treatment: Procedure was tolerated well Level of Consciousness Awake and Alert (Post-procedure): Post Debridement Measurements of Total Wound Length: (cm) 1.5 Width: (cm) 9 Depth: (cm) 1.3 Volume: (cm) 13.784 Character of Wound/Ulcer Post Debridement: Improved Severity of Tissue Post Debridement: Fat layer exposed Post Procedure Diagnosis Same as Pre-procedure Electronic Signature(s) Signed: 09/24/2019 2:35:12 PM By: [name] MD Signed: 09/24/2019 2:55:42 PM By: Deon Pilling Entered By: Deon Pilling on 09/24/2019 09:50:42 -------------------------------------------------------------------------------- HPI Details Patient Name: Date of Service: [name] 09/24/2019 8:45 AM Medical Record Number:[id number] Patient Account Number: [id number] Date of Birth/Sex: Treating RN: [birth year] (78 y.o. F) Primary Care Provider: [name] Other Clinician: Referring Provider: Treating Provider/[name]:Nadeen Shipman, [name], [name] Weeks in Treatment: 10 History of Present Illness HPI Description: ADMISSION 07/13/2019 Patient is a 78 year old type II diabetic. She has known PAD. She has been followed by Dr. Jacqualyn Posey of podiatry for blistering areas on her toes dating back to 04/30/2019 which at this was the left fourth toe. By 8/11 she had wounds on the right and left second toes. She underwent arterial studies by Dr. Alvester Chou on 7/31 that showed ABIs on the right of 0.60 TBI of 0.26 on the left ABI of 0.56 and a TBI of 0.25. By 9/10 she had ischemic-looking wounds per Dr. Earleen Newport on the right first, left first second and third. She has been using Medihoney and then mupirocin more recently simply Betadine. The patient underwent angiography by Dr. Gwenlyn Found on 9/21. 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 Noted that there was 1 vessel runoff in the foot via the peroneal. On the left there was an 80% left SFA, 70% mid left SFA. There was a short segment calcified CTO distal less than SFA and above-knee popliteal artery reconstituting with two-vessel runoff. 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 It was felt that she had bilateral SFA disease as well as tibial vessel disease. An attempt was made to revascularize the left SFA but they were unable to cross because of the highly calcified nature of the lesion. 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 She has an area on the medial left fourth toe with raised  horned callus on top of this. I am not certain what this represents. With regards to pain she has about a 2-1/2 I will  claudication tolerance in the grocery store. She has some pain in night which is relieved by putting her feet down over the side of the bed. She is wearing Nitro-Dur patches on the top of her right foot. Past medical history includes type 2 diabetes with secondary PAD, neoplasm of the skin, diabetic retinopathy, carotid artery stenosis, hyperlipidemia hypertension. 11/2; the last time the patient was here I spoke to Dr. Gwenlyn Found about revascularization options on the right. As I understand things currently Dr. Gwenlyn Found spoke with Dr. Trula Slade and ultimately the patient was taken to surgery on 10/27. She had a right iliofemoral endarterectomy with a bovine patch angioplasty. I think the plan now is for her to have a staged intervention on the right SFA by Dr. Alvester Chou. Per the patient's understanding Dr. Gwenlyn Found and Dr. Jacqualyn Posey are waiting to see when the gangrenous toes on the right foot can be amputated. The patient states her pain is a lot better and she is grateful for that. 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 Small area on the left fourth toe. She is using Betadine to the mummified areas on the right and Medihoney on the left 08/27/2019; the last time I saw this patient I discharged her from the clinic. She had been revascularized by Dr. Trula Slade and she had a right iliofemoral endarterectomy with a bovine angioplasty. 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 Jacqualyn Posey of podiatry on 08/07/2019. I note that she also had a intervention by Dr. Gwenlyn Found and he performed a directional atherectomy and drug- coated balloon angioplasty of the SFA and popliteal artery on the right. I am not certain of the date of Dr. Kennon Holter procedure as of the time of this dictation. She was referred back to [place] by Dr. Earleen Newport  predominantly for follow-up of the left fourth toe. She still has sutures and stitches in the right TMA site. She states her pain is a lot better. She expresses concern about the condition of the amputation site at the TMA. She is on doxycycline I think prescribed by Dr. Earleen Newport. She is complaining of some pain at night 12/10; I spoke to Dr. Jacqualyn Posey last week. He removed the sutures on the right foot on Monday of this week. She has the area on the left fourth toe just proximal to the PIP and then the right TMA site. She is still on doxycycline and has enough through next week. Unfortunately the TMA site does not look good at all. Both on the lateral and medial part of the incisions are areas that probe to bone. There is purulence over the medial part which I have cultured. We will use silver alginate. Left fourth toe looks somewhat better but there was still exposed bone 12/17; patient has an MRI booked for 12/30. Culture I did last week showed Pseudomonas Serratia and Enterococcus. This was purulent drainage coming out of the medial part of her amputation site. I use cefdinir 300 twice daily for 10 days that started on 12/15. Her x-ray on the right showed limited evaluation for osteomyelitis. The findings could have been postoperative. There was subtle erosion in the distal first and distal fifth metatarsal. An MRI was suggested. On the left she had irregularity of the fourth middle and proximal phalanx consistent with a history of osteomyelitis. I do not know that she has a history of osteomyelitis in this area. She had a newly defined area on the plantar  third toe 12/31; patient's MRI is listed below: ********************************************************************************************************************************************************************** MRI OF THE RIGHT FOREFOOT WITHOUT AND WITH CONTRAST TECHNIQUE: Multiplanar, multisequence MR imaging of the right forefoot  was performed before and after the administration of intravenous contrast. contrast. CONTRAST: 6 mL GADAVIST IV SOLN COMPARISON: Plain films right foot 09/04/2019. FINDINGS: Bones/Joint/Cartilage The patient is status post transmetatarsal amputation as seen on the prior exam. Marrow edema and enhancement are seen in all of the distal metatarsals. In the first metatarsal, signal change extends 1.5 cm proximal to the stump and in the second metatarsal extends approximately 2 cm proximal to the stump. Edema and enhancement are seen in only the distal 0.7 cm of the third metatarsal stump and tips of the fourth and fifth metatarsals. Bone marrow signal is otherwise unremarkable. A small focus of subchondral edema is seen in the lateral talus, likely degenerative. Ligaments Intact. Muscles and Tendons No intramuscular fluid collection. Soft tissues Skin ulceration is seen off the stump of the first metatarsal. A thin fluid collection tracks deep to the wound and over the anterior metatarsals worrisome for small abscess. Intense subcutaneous edema and enhancement are seen diffusely. 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 Cellulitis about the foot. Skin ulceration over the distal first metatarsal is identified with a thin fluid collection tracking anteriorly along the stump worrisome for abscess. Small focus of subchondral edema in the lateral talus is likely degenerative. Electronically Signed By: Inge Rise M.D. On: 09/23/2019 15:25 ********************************************************************************************************************************************************************** Patient arrives in clinic today not complaining any of any pain. We have been using silver alginate to the predominant areas in the surgical  site on her right transmetatarsal amputation. She does not describe claudication however her activity is very limited. Electronic Signature(s) Signed: 09/24/2019 2:35:12 PM By: [name] MD Entered By: [name] on 09/24/2019 10:09:57 -------------------------------------------------------------------------------- Physical Exam Details Patient Name: Date of Service: [name] 09/24/2019 8:45 AM Medical Record Number:[id number] Patient Account Number: [id number] Date of Birth/Sex: Treating RN: 18-Jun-1941 (78 y.o. F) Primary Care Provider: [name] Other Clinician: Referring Provider: Treating Provider/[name]:Kentaro Alewine, [name], [name] Weeks in Treatment: 10 Constitutional Patient is hypertensive.. Pulse regular and within target range for patient.Marland Kitchen Respirations regular, non-labored and within target range.. Temperature is normal and within the target range for the patient.Marland Kitchen Appears in no distress. Cardiovascular Popliteal pulses are palpable. I cannot feel pedal pulses on either side. Notes Wound exam Right TMA site. She has openings most problematically over the first and second MTP remanence and also on the lateral part of her foot. Areas debrided with a #5 curette removing necrotic debris from the wound circumference and in the case of the medial wounds the surface of the wounds. She does not tolerate this well. On the left she has a eschared area over the plantar aspect of the left third toe tip and an area on the fourth toe medially. No debridement done in these areas Electronic Signature(s) Signed: 09/24/2019 2:35:12 PM By: [name] MD Entered By: [name] on 09/24/2019 10:12:46 -------------------------------------------------------------------------------- Physician Orders Details Patient Name: Date of Service: [name] 09/24/2019 8:45 AM Medical Record Number:[id number] Patient Account Number: [id number] Date of  Birth/Sex: Treating RN: Feb 07, 1941 (78 y.o. [name] Primary Care Provider: [name] Other Clinician: Referring Provider: Treating Provider/[name]:Jarika Robben, [name], [name] Weeks in Treatment: 10 Verbal / Phone Orders: No Diagnosis Coding ICD-10 Coding Code Description E11.621 Type 2 diabetes mellitus  with foot ulcer E11.52 Type 2 diabetes mellitus with diabetic peripheral angiopathy with gangrene L97.518 Non-pressure chronic ulcer of other part of right foot with other specified severity L97.528 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ity E11.42 Type 2 diabetes mellitus with diabetic polyneuropathy Follow-up Appointments Return Appointment in 1 week. - Thursday Dressing Change Frequency Change Dressing every other day. - home health to change twice a week. wound center weekly. Wound Cleansing Clean wound with Wound Cleanser Primary Wound Dressing Wound #1 Left,Dorsal Toe Fourth Silver Collagen - moisten with hydrogel. Wound #7 Right Amputation Site - Transmetatarsal Silver Collagen - moisten with hydrogel. Wound #8 Left Toe Third Foam - or gauze to pad for protection. Secondary Dressing Wound #1 Left,Dorsal Toe Fourth Kerlix/Rolled Gauze Wound #7 Right Amputation Site - Transmetatarsal Kerlix/Rolled Gauze ABD pad Wound #8 Left Toe Third Kerlix/Rolled Gauze Dry Gauze Edema Control Avoid standing for long periods of time Elevate legs to the level of the heart or above for 30 minutes daily and/or when sitting, a frequency of: - throughout the day. Off-Loading DH Walker Boot to: - right foot [place] skilled nursing for wound care. - [facility] twice a week. Consults Infectious Disease - refer to infectious Disease related to osteomyelitis right foot for evaluation and treatment. - (ICD10 E11.621 - Type 2 diabetes mellitus with foot ulcer) Electronic  Signature(s) Signed: 09/24/2019 2:35:12 PM By: [name] MD Signed: 09/24/2019 2:55:42 PM By: Deon Pilling Entered By: Deon Pilling on 09/24/2019 09:52:40 -------------------------------------------------------------------------------- Prescription 09/24/2019 Patient Name: [name] Provider: [name] MD Date of Birth: 08/29/1941 NPI#: [id number] Sex: F DEA#: [id number] Phone #: [phone] License #: [id number] Patient Address: [facility] [place] [address] [place], [place] [address] [place], [place] [address] [phone] Allergies Demerol scopolamine Bactrim Reaction: increased K+ Versed Reaction: agitation Provider's Orders Infectious Disease - ICD10: E11.621 - refer to infectious Disease related to osteomyelitis right foot for evaluation and treatment. Signature(s): Date(s): Engineer, maintenance) Signed: 09/24/2019 2:35:12 PM By: [name] MD Signed: 09/24/2019 2:55:42 PM By: Deon Pilling Entered By: Deon Pilling on 09/24/2019 09:52:42 --------------------------------------------------------------------------------  Problem List Details Patient Name: Date of Service: [place], [place] 09/24/2019 8:45 AM Medical Record Number:[id number] Patient Account Number: [id number] Date of Birth/Sex: Treating RN: 1941-06-29 (78 y.o. [name], [name] Primary Care Provider: [name] Other Clinician: Referring Provider: Treating Provider/[name]:Kie Calvin, [name], [name] Weeks in Treatment: 10 Active Problems ICD-10 Evaluated Encounter Code Description Active Date Today Diagnosis E11.621 Type 2 diabetes mellitus with foot ulcer 07/13/2019 No Yes E11.52 Type 2 diabetes mellitus with diabetic peripheral angiopathy with 07/13/2019 No Yes gangrene L97.518 Non-pressure chronic ulcer of other part of right foot with other 07/13/2019 No Yes specified severity L97.528  Non-pressure chronic ulcer of other part of left foot with other specified 07/13/2019 No Yes severity E11.42 Type 2 diabetes mellitus with diabetic polyneuropathy 07/13/2019 No Yes Inactive Problems Resolved Problems Electronic Signature(s) Signed: 09/24/2019 2:35:12 PM By: [name] MD Entered By: [name] on 09/24/2019 10:07:38 -------------------------------------------------------------------------------- Progress Note Details Patient Name: Date of Service: [name] 09/24/2019 8:45 AM Medical Record Number:[id number] Patient Account Number: [id number] Date of Birth/Sex: Treating RN: [birth year] (78 y.o. F) Primary Care Provider: [name] Other Clinician: Referring Provider: Treating Provider/[name]:Elgar Scoggins, [name], [name] Weeks in Treatment: 10 Subjective History of Present Illness (HPI) ADMISSION 07/13/2019 Patient is a 78 year old type II diabetic. She has known PAD.  She has been followed by Dr. Jacqualyn Posey of podiatry for blistering areas on her toes dating back to 04/30/2019 which at this was the left fourth toe. By 8/11 she had wounds on the right and left second toes. She underwent arterial studies by Dr. Alvester Chou on 7/31 that showed ABIs on the right of 0.60 TBI of 0.26 on the left ABI of 0.56 and a TBI of 0.25. By 9/10 she had ischemic-looking wounds per Dr. Earleen Newport on the right first, left first second and third. She has been using Medihoney and then mupirocin more recently simply Betadine. The patient underwent angiography by Dr. Gwenlyn Found on 9/21. 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 Noted that there was 1 vessel runoff in the foot via the peroneal. ooOn the left there was an 80% left SFA, 70% mid left SFA. There was a short segment calcified CTO distal less than SFA and above-knee popliteal artery reconstituting with  two-vessel runoff. 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 It was felt that she had bilateral SFA disease as well as tibial vessel disease. An attempt was made to revascularize the left SFA but they were unable to cross because of the highly calcified nature of the lesion. 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 She has an area on the medial left fourth toe with raised horned callus on top of this. I am not certain what this represents. With regards to pain she has about a 2-1/2 I will claudication tolerance in the grocery store. She has some pain in night which is relieved by putting her feet down over the side of the bed. She is wearing Nitro-Dur patches on the top of her right foot. Past medical history includes type 2 diabetes with secondary PAD, neoplasm of the skin, diabetic retinopathy, carotid artery stenosis, hyperlipidemia hypertension. 11/2; the last time the patient was here I spoke to Dr. Gwenlyn Found about revascularization options on the right. As I understand things currently Dr. Gwenlyn Found spoke with Dr. Trula Slade and ultimately the patient was taken to surgery on 10/27. She had a right iliofemoral endarterectomy with a bovine patch angioplasty. I think the plan now is for her to have a staged intervention on the right SFA by Dr. Alvester Chou. Per the patient's understanding Dr. Gwenlyn Found and Dr. Jacqualyn Posey are waiting to see when the gangrenous toes on the right foot can be amputated. The patient states her pain is a lot better and she is grateful for that. 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 Small area on the left fourth toe. She is using Betadine to the mummified areas on the right and Medihoney on the left 08/27/2019; the last time I saw this patient I discharged her from the clinic. She had been revascularized by Dr. Trula Slade and she had  a right iliofemoral endarterectomy with a bovine angioplasty. 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 Jacqualyn Posey of podiatry on 08/07/2019. I note that she also had a intervention by Dr. Gwenlyn Found and he performed a directional atherectomy and drug- coated balloon angioplasty of the SFA and popliteal artery on the right. I am not certain of the date of Dr. Kennon Holter procedure as of the time of  this dictation. She was referred back to [place] by Dr. Earleen Newport predominantly for follow-up of the left fourth toe. She still has sutures and stitches in the right TMA site. She states her pain is a lot better. She expresses concern about the condition of the amputation site at the TMA. She is on doxycycline I think prescribed by Dr. Earleen Newport. She is complaining of some pain at night 12/10; I spoke to Dr. Jacqualyn Posey last week. He removed the sutures on the right foot on Monday of this week. She has the area on the left fourth toe just proximal to the PIP and then the right TMA site. She is still on doxycycline and has enough through next week. Unfortunately the TMA site does not look good at all. Both on the lateral and medial part of the incisions are areas that probe to bone. There is purulence over the medial part which I have cultured. We will use silver alginate. Left fourth toe looks somewhat better but there was still exposed bone 12/17; patient has an MRI booked for 12/30. Culture I did last week showed Pseudomonas Serratia and Enterococcus. This was purulent drainage coming out of the medial part of her amputation site. I use cefdinir 300 twice daily for 10 days that started on 12/15. Her x-ray on the right showed limited evaluation for osteomyelitis. The findings could have been postoperative. There was subtle erosion in the distal first and distal fifth metatarsal. An MRI was suggested. On the left she had irregularity of the fourth middle and proximal phalanx consistent  with a history of osteomyelitis. I do not know that she has a history of osteomyelitis in this area. She had a newly defined area on the plantar third toe 12/31; patient's MRI is listed below: ********************************************************************************************************************************************************************** MRI OF THE RIGHT FOREFOOT WITHOUT AND WITH CONTRAST TECHNIQUE: Multiplanar, multisequence MR imaging of the right forefoot was performed before and after the administration of intravenous contrast. CONTRAST: 6 mL GADAVIST IV SOLN COMPARISON: Plain films right foot 09/04/2019. FINDINGS: Bones/Joint/Cartilage The patient is status post transmetatarsal amputation as seen on the prior exam. Marrow edema and enhancement are seen in all of the distal metatarsals. In the first metatarsal, signal change extends 1.5 cm proximal to the stump and in the second metatarsal extends approximately 2 cm proximal to the stump. Edema and enhancement are seen in only the distal 0.7 cm of the third metatarsal stump and tips of the fourth and fifth metatarsals. Bone marrow signal is otherwise unremarkable. A small focus of subchondral edema is seen in the lateral talus, likely degenerative. Ligaments Intact. Muscles and Tendons No intramuscular fluid collection. Soft tissues Skin ulceration is seen off the stump of the first metatarsal. A thin fluid collection tracks deep to the wound and over the anterior metatarsals worrisome for small abscess. Intense subcutaneous edema and enhancement are seen diffusely. 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 Cellulitis about the foot. Skin ulceration over the distal first metatarsal is identified with a thin fluid collection tracking anteriorly along the stump  worrisome for abscess. Small focus of subchondral edema in the lateral talus is likely degenerative. Electronically Signed By: Inge Rise M.D. On: 09/23/2019 15:25 ********************************************************************************************************************************************************************** Patient arrives in clinic today not complaining any of any pain. 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 She does not describe claudication however her  activity is very limited. Objective Constitutional Patient is hypertensive.. Pulse regular and within target range for patient.Marland Kitchen Respirations regular, non-labored and within target range.. Temperature is normal and within the target range for the patient.Marland Kitchen Appears in no distress. Vitals Time Taken: 8:58 AM, Height: 64 in, Weight: 142 lbs, BMI: 24.4, Temperature: 97.8 F, Pulse: 79 bpm, Respiratory Rate: 18 breaths/min, Blood Pressure: 142/43 mmHg. Cardiovascular Popliteal pulses are palpable. I cannot feel pedal pulses on either side. General Notes: Wound exam ooRight TMA site. She has openings most problematically over the first and second MTP remanence and also on the lateral part of her foot. Areas debrided with a #5 curette removing necrotic debris from the wound circumference and in the case of the medial wounds the surface of the wounds. She does not tolerate this well. ooOn the left she has a eschared area over the plantar aspect of the left third toe tip and an area on the fourth toe medially. No debridement done in these areas Integumentary (Hair, Skin) Wound #1 status is Open. Original cause of wound was Blister. The wound is located on the Left,Dorsal Toe Fourth. The wound measures 0.5cm length x 0.4cm width x 0.1cm depth; 0.157cm^2 area and 0.016cm^3 volume. There is bone and Fat Layer (Subcutaneous Tissue) Exposed  exposed. There is no tunneling or undermining noted. There is a small amount of serosanguineous drainage noted. The wound margin is distinct with the outline attached to the wound base. There is medium (34-66%) pink granulation within the wound bed. There is a small (1- 33%) amount of necrotic tissue within the wound bed including Adherent Slough. Wound #7 status is Open. Original cause of wound was Surgical Injury. 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 The wound measures 1.5cm length x 9cm width x 1.3cm depth; 10.603cm^2 area and 13.784cm^3 volume. There is bone and Fat Layer (Subcutaneous Tissue) Exposed exposed. There is no tunneling or undermining noted. There is a medium amount of purulent drainage noted. The wound margin is well defined and not attached to the wound base. There is medium (34-66%) granulation within the wound bed. There is a medium (34-66%) amount of necrotic tissue within the wound bed including Eschar and Adherent Slough. Wound #8 status is Open. Original cause of wound was Gradually Appeared. The wound is located on the Left Toe Third. The wound measures 0.5cm length x 0.6cm width x 0.1cm depth; 0.236cm^2 area and 0.024cm^3 volume. There is no tunneling or undermining noted. There is a medium amount of serosanguineous drainage noted. The wound margin is distinct with the outline attached to the wound base. There is no granulation within the wound bed. There is a medium (34-66%) amount of necrotic tissue within the wound bed including Eschar. 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 There was a Excisional Skin/Subcutaneous Tissue Debridement with a total area of 7.5 sq cm performed by Ricard Dillon., MD. With the following instrument(s): Curette to remove Viable and Non-Viable tissue/material. Material removed includes Fat, Subcutaneous Tissue, Slough, Skin: Dermis,  and Fibrin/Exudate after achieving pain control using Other (benzocaine 20%). A time out was conducted at 09:40, prior to the start of the procedure. A Minimum amount of bleeding was controlled with Pressure. The procedure was tolerated well with a pain level of 0 throughout and a pain level of 0 following the procedure. Post Debridement Measurements: 1.5cm length x 9cm width x 1.3cm depth; 13.784cm^3 volume. Character of Wound/Ulcer Post Debridement is improved. Severity of Tissue Post Debridement is: Fat layer exposed. Post procedure Diagnosis Wound #7: Same as Pre-Procedure Plan Follow-up Appointments: Return Appointment in 1 week. - Thursday Dressing Change Frequency: Change Dressing every other day. - home health to change twice a week. wound center weekly. Wound Cleansing: Clean wound with Wound Cleanser Primary Wound Dressing: Wound #1 Left,Dorsal Toe Fourth: Silver Collagen - moisten with hydrogel. Wound #7 Right Amputation Site - Transmetatarsal: Silver Collagen - moisten with hydrogel. Wound #8 Left Toe Third: Foam - or gauze to pad for protection. Secondary Dressing: Wound #1 Left,Dorsal Toe Fourth: Kerlix/Rolled Gauze Wound #7 Right Amputation Site - Transmetatarsal: Kerlix/Rolled Gauze ABD pad Wound #8 Left Toe Third: Kerlix/Rolled Gauze Dry Gauze Edema Control: Avoid standing for long periods of time Elevate legs to the level of the heart or above for 30 minutes daily and/or when  sitting, a frequency of: - throughout the day. Off-Loading: DH Walker Boot to: - right foot Home Health: [facility] for wound care. - [facility] a week. 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 1. Wounds were debrided to clean up the surface 2. I change the dressings on open areas on the right TMA site and the left fourth toe to silver collagen moistened with hydrogel change daily to 3. Extensive discussion with the patient about the approach here. If I was to give her a 30% chance of hanging onto the right foot that would be optimistic. In order to have any chance of doing that she will need IV antibiotics . However reach out to Dr. Gwenlyn Found to see if there is anything else he can do on the vascular front. She will no doubt require hyperbaric oxygen. Given the extent and the complexity of this with very little guarantee of success I think below-knee amputation would also be a reasonable thing to the side and I told her this. I do not believe that she would be offered foot conserving surgery in the absence of adequate perfusion. I will give her call next week to make a final decision here. 4. It will be possible to get a piece of bone for culture and if she elects to go with nonsurgical therapy then I will go after the exposed bone for biopsy and culture Electronic Signature(s) Signed: 09/24/2019 2:35:12 PM By: [name] MD Entered By: [name] on 09/24/2019 10:15:36 -------------------------------------------------------------------------------- SuperBill Details Patient Name: Date of Service: [name] 09/24/2019 Medical Record Number:[id number] Patient Account Number: [id number] Date of Birth/Sex: Treating RN: 08/08/1941 (78 y.o. [name], [name] Primary Care Provider: [name] Other Clinician: Referring Provider: Treating  Provider/[name]:Natividad Halls, [name], [name] Weeks in Treatment: 10 Diagnosis Coding ICD-10 Codes Code Description E11.621 Type 2 diabetes mellitus with foot ulcer E11.52 Type 2 diabetes mellitus with diabetic peripheral angiopathy with gangrene L97.518 Non-pressure chronic ulcer of other part of right foot with other specified severity L97.528 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ity E11.42 Type 2  diabetes mellitus with diabetic polyneuropathy Facility Procedures The patient participates with Medicare or their insurance follows the Medicare Facility Guidelines: CPT4 Code Description Modifier Quantity [id number] 11042 - DEB SUBQ TISSUE 20 SQ CM/< 1 ICD-10 Diagnosis Description L97.518 Non-pressure chronic ulcer of  other part of right foot with other specified severity E11.621 Type 2 diabetes mellitus with foot ulcer Physician Procedures CPT4 Code: 6751982 Description: 42998 - WC PHYS SUBQ TISS 20 SQ CM ICD-10 Diagnosis Description L97.518 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

## 2019-09-28 DIAGNOSIS — E785 Hyperlipidemia, unspecified: Secondary | ICD-10-CM | POA: Diagnosis not present

## 2019-09-28 DIAGNOSIS — D649 Anemia, unspecified: Secondary | ICD-10-CM | POA: Diagnosis not present

## 2019-09-28 DIAGNOSIS — I10 Essential (primary) hypertension: Secondary | ICD-10-CM | POA: Diagnosis not present

## 2019-09-28 DIAGNOSIS — E1152 Type 2 diabetes mellitus with diabetic peripheral angiopathy with gangrene: Secondary | ICD-10-CM | POA: Diagnosis not present

## 2019-09-28 DIAGNOSIS — Z4781 Encounter for orthopedic aftercare following surgical amputation: Secondary | ICD-10-CM | POA: Diagnosis not present

## 2019-09-28 DIAGNOSIS — K219 Gastro-esophageal reflux disease without esophagitis: Secondary | ICD-10-CM | POA: Diagnosis not present

## 2019-09-29 NOTE — Progress Notes (Signed)
[name], [name] ([id number]) Visit Report for 09/24/2019 Arrival Information Details Patient Name: Date of Service: [name], [name] 09/24/2019 8:45 AM Medical Record Number:[id number] Patient Account Number: [id number] Date of Birth/Sex: Treating RN: 20-Apr-1941 (79 y.o. [name] Primary Care Gerard Cantara: [name] Other Clinician: Referring Glenola Wheat: Treating Quinn Quam/[name]:[name], [name], [name] Weeks in Treatment: 10 Visit Information History Since Last Visit Added or deleted any medications: No Patient Arrived: [name]: No Arrival Time: 08:58 Had a fall or experienced change in No Accompanied By: self activities of daily living that may affect Transfer Assistance: None risk of falls: Patient Identification Verified: Yes Signs or symptoms of abuse/neglect since last No Secondary Verification Process Yes visito Completed: Hospitalized since last visit: No Patient Requires Transmission- No Implantable device outside of the clinic excluding No Based Precautions: cellular tissue based products placed in the center Patient Has Alerts: Yes since last visit: Patient Alerts: Right ABI .69 TBI Has Dressing in Place as Prescribed: Yes refused Pain Present Now: No Left ABI .60 TBI .14 Electronic Signature(s) Signed: 09/28/2019 4:32:10 PM By: Kela Millin Entered By: Kela Millin on 09/24/2019 08:58:26 -------------------------------------------------------------------------------- Encounter Discharge Information Details Patient Name: Date of Service: [name] 09/24/2019 8:45 AM Medical Record Number:[id number] Patient Account Number: [id number] Date of Birth/Sex: Treating RN: 1940/10/05 (79 y.o. [name] Primary Care Mckinleigh Schuchart: [name] Other Clinician: Referring Bettylee Feig: Treating Ivey Nembhard/[name]:[name], [name], [name] Weeks in Treatment: 10 Encounter  Discharge Information Items Post Procedure Vitals Discharge Condition: Stable Temperature (F): 98.7 Ambulatory Status: Walker Pulse (bpm): 79 Discharge Destination: Home Respiratory Rate (breaths/min): 18 Transportation: Private Auto Blood Pressure (mmHg): 142/43 Accompanied By: alone Schedule Follow-up Appointment: Yes Clinical Summary of Care: Patient Declined Electronic Signature(s) Signed: 09/29/2019 5:52:47 PM By: Baruch Gouty RN, BSN Entered By: Baruch Gouty on 09/24/2019 10:16:37 -------------------------------------------------------------------------------- Lower Extremity Assessment Details Patient Name: Date of Service: [name], [name] 09/24/2019 8:45 AM Medical Record Number:[id number] Patient Account Number: [id number] Date of Birth/Sex: Treating RN: September 29, 1940 (79 y.o. [name] Primary Care Sacoya Mcgourty: [name] Other Clinician: Referring Tayvin Preslar: Treating Mirjana Tarleton/[name]:[name], [name], [name] Weeks in Treatment: 10 Edema Assessment Assessed: [Left: No] [Right: No] Edema: [Left: No] [Right: No] Calf Left: Right: Point of Measurement: 39 cm From Medial Instep 29 cm 27.5 cm Ankle Left: Right: Point of Measurement: 10 cm From Medial Instep 18.8 cm 18.5 cm Vascular Assessment Pulses: Dorsalis Pedis Palpable: [Left:Yes] [Right:Yes] Electronic Signature(s) Signed: 09/28/2019 4:32:10 PM By: Kela Millin Entered By: Kela Millin on 09/24/2019 09:05:28 -------------------------------------------------------------------------------- Multi Wound Chart Details Patient Name: Date of Service: [name] 09/24/2019 8:45 AM Medical Record Number:[id number] Patient Account Number: [id number] Date of Birth/Sex: Treating RN: December 02, 1940 (79 y.o. F) Primary Care Harlan Ervine: Other Clinician: [name] Referring Damoney Julia: Treating Saniyah Mondesir/[name]:[name], [name], [name] Weeks in Treatment: 10 Vital  Signs Height(in): 64 Pulse(bpm): 42 Weight(lbs): 142 Blood Pressure(mmHg): 142/43 Body Mass Index(BMI): 24 Temperature(F): 97.8 Respiratory 18 Rate(breaths/min): Photos: [1:No Photos] [7:No Photos] [8:No Photos] Wound Location: [1:Left Toe Fourth - DorsalRight Amputation Site -] [7:Transmetatarsal] [8:Left Toe Third] Wounding Event: [1:Blister] [7:Surgical Injury] [8:Gradually Appeared] 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] [7:Lower Extremity] [8:Lower Extremity] Secondary Etiology: [1:Arterial Insufficiency Ulcer N/A] [8:N/A] Comorbid History: [1:Deep Vein Thrombosis, Hypertension, Peripheral Hypertension, Peripheral Hypertension, Peripheral Arterial Disease, Type II Diabetes] [7:Deep Vein Thrombosis, Arterial Disease, Type II Arterial Disease, Type II Diabetes] [8:Deep Vein  Thrombosis, Diabetes] Date Acquired: [1:09/16/2018] [7:08/07/2019] [8:09/10/2019] Weeks of Treatment: [  1:10] [7:4] [8:2] Wound Status: [1:Open] [7:Open] [8:Open] Measurements L x W x D 0.5x0.4x0.1 [7:1.5x9x1.3] [8:0.5x0.6x0.1] (cm) Area (cm) : [1:0.157] [7:10.603] [8:0.236] Volume (cm) : [1:0.016] [7:13.784] [8:0.024] % Reduction in Area: [1:77.80%] [7:-2.30%] [8:46.40%] % Reduction in Volume: 77.50% [7:-1229.20%] [8:45.50%] Classification: [1:Grade 2] [7:Grade 4] [8:Grade 2] Exudate Amount: [1:Small] [7:Medium] [8:Medium] Exudate Type: [1:Serosanguineous] [7:Purulent] [8:Serosanguineous] Exudate Color: [1:red, brown] [7:yellow, brown, green] [8:red, brown] Wound Margin: [1:Distinct, outline attached Well defined, not attached Distinct, outline attached] Granulation Amount: [1:Medium (34-66%)] [7:Medium (34-66%)] [8:None Present (0%)] Granulation Quality: [1:Pink] [7:N/A] [8:N/A] Necrotic Amount: [1:Small (1-33%)] [7:Medium (34-66%)] [8:Medium (34-66%)] Necrotic Tissue: [1:Adherent Slough] [7:Eschar, Adherent Slough  Eschar] Exposed Structures: [1:Fat Layer (Subcutaneous Fat Layer (Subcutaneous Fascia: No Tissue) Exposed: Yes Bone: Yes Fascia: No Tendon: No Muscle: No Joint: No] [7:Tissue) Exposed: Yes Bone: Yes Fascia: No Tendon: No Muscle: No Joint: No] [8:Fat Layer (Subcutaneous Tissue)  Exposed: No Tendon: No Muscle: No Joint: No Bone: No] Epithelialization: [1:Small (1-33%)] [7:None] [8:None] Debridement: [1:N/A] [7:Debridement - Excisional N/A] Pre-procedure [1:N/A] [7:09:40] [8:N/A] Verification/Time Out Taken: Pain Control: [1:N/A] [7:Other] [8:N/A] Tissue Debrided: [1:N/A] [7:Fat, Subcutaneous, Slough N/A] Level: [1:N/A] [7:Skin/Subcutaneous Tissue N/A] Debridement Area (sq cm):N/A [7:7.5] [8:N/A] Instrument: [1:N/A] [7:Curette] [8:N/A] Bleeding: [1:N/A] [7:Minimum] [8:N/A] Hemostasis Achieved: [1:N/A] [7:Pressure] [8:N/A] Procedural Pain: [1:N/A] [7:0] [8:N/A] Post Procedural Pain: [1:N/A] [7:0] [8:N/A] Debridement Treatment [1:N/A] [7:Procedure was tolerated] [8:N/A] Response: [7:well] Post Debridement [1:N/A] [7:1.5x9x1.3] [8:N/A] Measurements L x W x D (cm) Post Debridement [1:N/A] [7:13.784] [8:N/A] Volume: (cm) Procedures Performed: [1:N/A] [7:Debridement] [8:N/A] Treatment Notes Electronic Signature(s) Signed: 09/24/2019 2:35:12 PM By: [name] MD Entered By: [name] on 09/24/2019 10:07:43 -------------------------------------------------------------------------------- Multi-Disciplinary Care Plan Details Patient Name: Date of Service: [name] 09/24/2019 8:45 AM Medical Record Number:[id number] Patient Account Number: [id number] Date of Birth/Sex: Treating RN: [birth year] (79 y.o. [name] Primary Care Reshard Guillet: [name] Other Clinician: Referring Corley Kohls: Treating Daisuke Bailey/[name]:[name], [name], [name] Weeks in Treatment: 10 Active Inactive Abuse / Safety / Falls / Self Care Management Nursing  Diagnoses: Potential for falls Goals: Patient will remain injury free related to falls Date Initiated: 08/27/2019 Target Resolution Date: 10/02/2019 Goal Status: Active Interventions: Provide education on fall prevention Notes: Pain, Acute or Chronic Nursing Diagnoses: Pain, acute or chronic: actual or potential Potential alteration in comfort, pain Goals: Patient will verbalize adequate pain control and receive pain control interventions during procedures as needed Date Initiated: 08/27/2019 Target Resolution Date: 10/02/2019 Goal Status: Active Interventions: Encourage patient to take pain medications as prescribed Provide education on pain management Reposition patient for comfort Notes: Electronic Signature(s) Signed: 09/24/2019 2:55:42 PM By: Deon Pilling Entered By: Deon Pilling on 09/24/2019 09:33:02 -------------------------------------------------------------------------------- Pain Assessment Details Patient Name: Date of Service: [name], [name] 09/24/2019 8:45 AM Medical Record Number:[id number] Patient Account Number: [id number] Date of Birth/Sex: Treating RN: 1940-12-31 (79 y.o. [name] Primary Care Magon Croson: [name] Other Clinician: Referring Alita Waldren: Treating Adalid Beckmann/[name]:[name], [name], [name] Weeks in Treatment: 10 Active Problems Location of Pain Severity and Description of Pain Patient Has Paino No Site Locations Pain Management and Medication Current Pain Management: Electronic Signature(s) Signed: 09/28/2019 4:32:10 PM By: Kela Millin Entered By: Kela Millin on 09/24/2019 09:02:09 -------------------------------------------------------------------------------- Patient/Caregiver Education Details Patient Name: Date of Service: [name] 12/31/2020andnbsp8:45 AM Medical Record Patient Account Number: [id number] Number: Treating RN: Deon Pilling Date of Birth/Gender: 08/06/1941  (79 y.o. F) Other Clinician: Primary Care Physician: Verna Czech Referring Physician: Physician/[name]: Darl Pikes in Treatment: 10 Education Assessment  Education Provided To: Patient Education Topics Provided Safety: Handouts: Personal Safety Methods: Explain/Verbal Responses: Reinforcements needed Electronic Signature(s) Signed: 09/24/2019 2:55:42 PM By: Deon Pilling Entered By: Deon Pilling on 09/24/2019 09:33:14 -------------------------------------------------------------------------------- Wound Assessment Details Patient Name: Date of Service: [name], [name] 09/24/2019 8:45 AM Medical Record Number:[id number] Patient Account Number: [id number] Date of Birth/Sex: Treating RN: [birth year] (79 y.o. [name] Primary Care Aurthur Wingerter: [name] Other Clinician: Referring Karee Christopherson: Treating Jerrilyn Messinger/[name]:[name], [name], [name] Weeks in Treatment: 10 Wound Status Wound Number: 1 Primary Diabetic Wound/Ulcer of the Lower Extremity Etiology: Wound Location: Left Toe Fourth - Dorsal Secondary Arterial Insufficiency Ulcer Wounding Event: Blister Etiology: Date Acquired: 09/16/2018 Wound Open Weeks Of Treatment: 10 Status: Clustered Wound: No Comorbid Deep Vein Thrombosis, Hypertension, History: Peripheral Arterial Disease, Type II Diabetes Photos Wound Measurements Length: (cm) 0.5 % Reductio Width: (cm) 0.4 % Reductio Depth: (cm) 0.1 Epithelial Area: (cm) 0.157 Tunneling Volume: (cm) 0.016 Undermini Wound Description Classification: Grade 2 Wound Margin: Distinct, outline attached Exudate Amount: Small Exudate Type: Serosanguineous Exudate Color: red, brown Wound Bed Granulation Amount: Medium (34-66%) Granulation Quality: Pink Necrotic Amount: Small (1-33%) Necrotic Quality: Adherent Slough Foul Odor After Cleansing: No Slough/Fibrino Yes Exposed Structure Fascia  Exposed: No Fat Layer (Subcutaneous Tissue) Exposed: Yes Tendon Exposed: No Muscle Exposed: No Joint Exposed: No Bone Exposed: Yes n in Area: 77.8% n in Volume: 77.5% ization: Small (1-33%) : No ng: No Treatment Notes Wound #1 (Left, Dorsal Toe Fourth) 2. Periwound Care Moisturizing lotion 3. Primary Dressing Applied Collegen AG 4. Secondary Dressing Roll Gauze Notes netting Electronic Signature(s) Signed: 09/29/2019 3:29:37 PM By: [name] EMT/HBOT Signed: 09/29/2019 5:45:44 PM By: Kela Millin Previous Signature: 09/28/2019 4:32:10 PM Version By: Kela Millin Entered By: [name] on 09/29/2019 14:05:12 -------------------------------------------------------------------------------- Wound Assessment Details Patient Name: Date of Service: [name] 09/24/2019 8:45 AM Medical Record Number:[id number] Patient Account Number: [id number] Date of Birth/Sex: Treating RN: 1940-12-02 (79 y.o. [name] Primary Care Schneur Crowson: [name] Other Clinician: Referring Atley Scarboro: Treating Tristine Langi/[name]:[name], [name], [name] Weeks in Treatment: 10 Wound Status Wound Number: 7 Primary Diabetic Wound/Ulcer of the Lower Extremity Etiology: Wound Location: Right Amputation Site - Transmetatarsal Wound Open Status: Wounding Event: Surgical Injury Comorbid Deep Vein Thrombosis, Hypertension, Date Acquired: 08/07/2019 History: Peripheral Arterial Disease, Type II Diabetes Weeks Of Treatment: 4 Clustered Wound: No Photos Wound Measurements Length: (cm) 1.5 Width: (cm) 9 Depth: (cm) 1.3 Area: (cm) 10.603 Volume: (cm) 13.784 Wound Description Classification: Grade 4 Wound Margin: Well defined, not attached Exudate Amount: Medium Exudate Type: Purulent Exudate Color: yellow, brown, green Wound Bed Granulation Amount: Medium (34-66%) Necrotic Amount: Medium (34-66%) Necrotic Quality: Eschar, Adherent Slough fter  Cleansing: No ino Yes Exposed Structure sed: No Subcutaneous Tissue) Exposed: Yes sed: No sed: No ed: No d: Yes % Reduction in Area: -2.3% % Reduction in Volume: -1229.2% Epithelialization: None Tunneling: No Undermining: No Foul Odor A Slough/Fibr Fascia Expo Fat Layer ( Tendon Expo Muscle Expo Joint Expos Bone Expose Treatment Notes Wound #7 (Right Amputation Site - Transmetatarsal) 2. Periwound Care Moisturizing lotion 3. Primary Dressing Applied Collegen AG 4. Secondary Dressing ABD Pad Dry Gauze Roll Gauze 5. Secured With Other (specify in notes) 7. Footwear/Offloading device applied Removable Cast Walker/Walking Boot Notes Horticulturist, commercial) Signed: 09/29/2019 3:29:37 PM By: [name] EMT/HBOT Signed: 09/29/2019 5:45:44 PM By: Kela Millin Previous Signature: 09/28/2019 4:32:10 PM Version By: Kela Millin Entered By: [name] on 09/29/2019 14:06:42 -------------------------------------------------------------------------------- Wound Assessment Details Patient Name: Date of  Service: [name], [name] 09/24/2019 8:45 AM Medical Record Number:[id number] Patient Account Number: [id number] Date of Birth/Sex: Treating RN: 12-03-1940 (79 y.o. [name] Primary Care Branna Cortina: [name] Other Clinician: Referring Bartholomew Ramesh: Treating Yannely Kintzel/[name]:[name], [name], [name] Weeks in Treatment: 10 Wound Status Wound Number: 8 Primary Diabetic Wound/Ulcer of the Lower Extremity Etiology: Wound Location: Left Toe Third Wound Open Wounding Event: Gradually Appeared Status: Date Acquired: 09/10/2019 Comorbid Deep Vein Thrombosis, Hypertension, Weeks Of Treatment: 2 History: Peripheral Arterial Disease, Type II Diabetes Clustered Wound: No Photos Wound Measurements Length: (cm) 0.5 Width: (cm) 0.6 Depth: (cm) 0.1 Area: (cm) 0.236 Volume: (cm) 0.024 Wound Description Classification: Grade  2 Wound Margin: Distinct, outline attached Exudate Amount: Medium Exudate Type: Serosanguineous Exudate Color: red, brown Wound Bed Granulation Amount: None Present (0%) Necrotic Amount: Medium (34-66%) Necrotic Quality: Eschar After Cleansing: No brino No Exposed Structure posed: No (Subcutaneous Tissue) Exposed: No posed: No posed: No osed: No sed: No % Reduction in Area: 46.4% % Reduction in Volume: 45.5% Epithelialization: None Tunneling: No Undermining: No Foul Odor Slough/Fi Fascia Ex Fat Layer Tendon Ex Muscle Ex Joint Exp Bone Expo Treatment Notes Wound #8 (Left Toe Third) 3. Primary Dressing Applied Foam 5. Secured With Tape Notes Horticulturist, commercial) Signed: 09/29/2019 3:29:37 PM By: [name] EMT/HBOT Signed: 09/29/2019 5:45:44 PM By: Kela Millin Previous Signature: 09/28/2019 4:32:10 PM Version By: Kela Millin Entered By: [name] on 09/29/2019 14:05:48 -------------------------------------------------------------------------------- Vitals Details Patient Name: Date of Service: [name] 09/24/2019 8:45 AM Medical Record Number:[id number] Patient Account Number: [id number] Date of Birth/Sex: Treating RN: 01-30-1941 (79 y.o. [name] Primary Care Shanee Batch: [name] Other Clinician: Referring Yossi Hinchman: Treating Norah Fick/[name]:[name], [name], [name] Weeks in Treatment: 10 Vital Signs Time Taken: 08:58 Temperature (F): 97.8 Height (in): 64 Pulse (bpm): 79 Weight (lbs): 142 Respiratory Rate (breaths/min): 18 Body Mass Index (BMI): 24.4 Blood Pressure (mmHg): 142/43 Reference Range: 80 - 120 mg / dl Electronic Signature(s) Signed: 09/28/2019 4:32:10 PM By: Kela Millin Entered By: Kela Millin on 09/24/2019 09:01:21

## 2019-09-30 DIAGNOSIS — D225 Melanocytic nevi of trunk: Secondary | ICD-10-CM | POA: Diagnosis not present

## 2019-09-30 DIAGNOSIS — Z85828 Personal history of other malignant neoplasm of skin: Secondary | ICD-10-CM | POA: Diagnosis not present

## 2019-09-30 DIAGNOSIS — D1801 Hemangioma of skin and subcutaneous tissue: Secondary | ICD-10-CM | POA: Diagnosis not present

## 2019-09-30 DIAGNOSIS — D692 Other nonthrombocytopenic purpura: Secondary | ICD-10-CM | POA: Diagnosis not present

## 2019-09-30 DIAGNOSIS — L720 Epidermal cyst: Secondary | ICD-10-CM | POA: Diagnosis not present

## 2019-10-01 ENCOUNTER — Ambulatory Visit (INDEPENDENT_AMBULATORY_CARE_PROVIDER_SITE_OTHER): Payer: Medicare Other | Admitting: Internal Medicine

## 2019-10-01 ENCOUNTER — Other Ambulatory Visit: Payer: Self-pay

## 2019-10-01 ENCOUNTER — Other Ambulatory Visit (HOSPITAL_BASED_OUTPATIENT_CLINIC_OR_DEPARTMENT_OTHER): Payer: Self-pay | Admitting: Internal Medicine

## 2019-10-01 ENCOUNTER — Encounter: Payer: Self-pay | Admitting: Internal Medicine

## 2019-10-01 ENCOUNTER — Encounter (HOSPITAL_BASED_OUTPATIENT_CLINIC_OR_DEPARTMENT_OTHER): Payer: Medicare Other | Admitting: Internal Medicine

## 2019-10-01 ENCOUNTER — Other Ambulatory Visit (HOSPITAL_COMMUNITY)
Admission: RE | Admit: 2019-10-01 | Discharge: 2019-10-01 | Disposition: A | Discharge status: Home / Self Care | Payer: Medicare Other | Source: Other Acute Inpatient Hospital | Attending: Internal Medicine | Admitting: Internal Medicine

## 2019-10-01 VITALS — BP 148/74 | HR 91 | Temp 98.0°F | Wt 135.2 lb

## 2019-10-01 DIAGNOSIS — E1169 Type 2 diabetes mellitus with other specified complication: Secondary | ICD-10-CM | POA: Insufficient documentation

## 2019-10-01 DIAGNOSIS — E11319 Type 2 diabetes mellitus with unspecified diabetic retinopathy without macular edema: Secondary | ICD-10-CM | POA: Diagnosis not present

## 2019-10-01 DIAGNOSIS — M869 Osteomyelitis, unspecified: Secondary | ICD-10-CM

## 2019-10-01 DIAGNOSIS — L84 Corns and callosities: Secondary | ICD-10-CM | POA: Diagnosis not present

## 2019-10-01 DIAGNOSIS — L97522 Non-pressure chronic ulcer of other part of left foot with fat layer exposed: Secondary | ICD-10-CM | POA: Diagnosis not present

## 2019-10-01 DIAGNOSIS — E1142 Type 2 diabetes mellitus with diabetic polyneuropathy: Secondary | ICD-10-CM | POA: Insufficient documentation

## 2019-10-01 DIAGNOSIS — M86171 Other acute osteomyelitis, right ankle and foot: Secondary | ICD-10-CM | POA: Diagnosis not present

## 2019-10-01 DIAGNOSIS — I10 Essential (primary) hypertension: Secondary | ICD-10-CM | POA: Insufficient documentation

## 2019-10-01 DIAGNOSIS — I6529 Occlusion and stenosis of unspecified carotid artery: Secondary | ICD-10-CM | POA: Insufficient documentation

## 2019-10-01 DIAGNOSIS — I6523 Occlusion and stenosis of bilateral carotid arteries: Secondary | ICD-10-CM | POA: Diagnosis not present

## 2019-10-01 DIAGNOSIS — S91105A Unspecified open wound of left lesser toe(s) without damage to nail, initial encounter: Secondary | ICD-10-CM | POA: Diagnosis not present

## 2019-10-01 DIAGNOSIS — E11621 Type 2 diabetes mellitus with foot ulcer: Secondary | ICD-10-CM | POA: Insufficient documentation

## 2019-10-01 DIAGNOSIS — E1152 Type 2 diabetes mellitus with diabetic peripheral angiopathy with gangrene: Secondary | ICD-10-CM | POA: Insufficient documentation

## 2019-10-01 DIAGNOSIS — T8189XA Other complications of procedures, not elsewhere classified, initial encounter: Secondary | ICD-10-CM | POA: Diagnosis not present

## 2019-10-01 DIAGNOSIS — L97512 Non-pressure chronic ulcer of other part of right foot with fat layer exposed: Secondary | ICD-10-CM | POA: Insufficient documentation

## 2019-10-01 DIAGNOSIS — M86371 Chronic multifocal osteomyelitis, right ankle and foot: Secondary | ICD-10-CM | POA: Insufficient documentation

## 2019-10-01 DIAGNOSIS — L97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 limited to breakdown of skin: Secondary | ICD-10-CM | POA: Insufficient documentation

## 2019-10-01 DIAGNOSIS — L97516 Non-pressure chronic ulcer of other part of right foot with bone involvement without evidence of necrosis: Secondary | ICD-10-CM | POA: Diagnosis not present

## 2019-10-01 NOTE — Progress Notes (Signed)
RFV: diabetic foot osteomeylitis  Patient ID: [name], female   DOB: 05-12-1941, 79 y.o.   MRN: [id number]  HPI 79yo F with DM and DFU/osteomeylitis.she went to wound care with bone cx sent today. Getting ready for HBO treatment. Also has hx of vascular disease and is being evaluated with revascularizatoin of right foot by dr berry.  Allergies = unable to take bactrim - caused hyperK+,   Outpatient Encounter Medications as of 10/01/2019  Medication Sig  . acetaminophen (TYLENOL) 500 MG tablet Take 1,000 mg by mouth every 6 (six) hours as needed for moderate pain or headache.  . Ascorbic Acid (VITAMIN C) 1000 MG tablet Take 1,000 mg by mouth daily with supper.   Marland Kitchen aspirin 81 MG tablet Take 81 mg by mouth every evening.   Marland Kitchen atorvastatin (LIPITOR) 20 MG tablet Take 1 tablet by mouth once daily (Patient taking differently: Take 20 mg by mouth every evening. )  . calcium carbonate (TUMS - DOSED IN MG ELEMENTAL CALCIUM) 500 MG chewable tablet Chew 2 tablets by mouth daily as needed for indigestion or heartburn.  Marland Kitchen CINNAMON PO Take 1,000 mg by mouth 3 (three) times daily with meals.   . clopidogrel (PLAVIX) 75 MG tablet Take 1 tablet by mouth once daily  . diltiazem (CARTIA XT) 240 MG 24 hr capsule Take 1 capsule (240 mg total) by mouth daily.  . fish oil-omega-3 fatty acids 1000 MG capsule Take 1 g by mouth 4 (four) times a week. Sun, Mon, Wed, Fri at night  . glipiZIDE (GLUCOTROL XL) 5 MG 24 hr tablet Take 1 tablet by mouth once daily with breakfast  . hydrochlorothiazide (HYDRODIURIL) 25 MG tablet Take 1 tablet (25 mg total) by mouth daily.  . INVOKANA 100 MG TABS tablet TAKE 1 TABLET BY MOUTH BEFORE BREAKFAST  . Magnesium 250 MG TABS Take 250 mg by mouth daily with supper.  . metFORMIN (GLUCOPHAGE) 1000 MG tablet Take 1 tablet (1,000 mg total) by mouth 2 (two) times daily.  . metoprolol (LOPRESSOR) 100 MG tablet Take half tab in the morning and a whole tab in the evening. (Patient taking  differently: Take 50-100 mg by mouth See admin instructions. Take 50 mg in the morning and 100 mg in the evening)  . nitroGLYCERIN (NITRO-DUR) 0.2 mg/hr patch Place 1 patch (0.2 mg total) onto the skin daily. Apply behind right toe daily  . oxyCODONE (OXY IR/ROXICODONE) 5 MG immediate release tablet Take 1 tablet (5 mg total) by mouth every 6 (six) hours as needed for moderate pain.  Marland Kitchen OZEMPIC, 0.25 OR 0.5 MG/DOSE, 2 MG/1.5ML SOPN INJECT 0.5 MG INTO THE SKIN ONCE A WEEK  . pantoprazole (PROTONIX) 40 MG tablet Take 1 tablet (40 mg total) by mouth daily.  Marland Kitchen telmisartan (MICARDIS) 80 MG tablet Take 1 tablet (80 mg total) by mouth daily. (Patient taking differently: Take 40 mg by mouth every evening. )  . vitamin B-12 (CYANOCOBALAMIN) 1000 MCG tablet Take 1,000 mcg by mouth daily with supper.  . doxycycline (VIBRA-TABS) 100 MG tablet Take 1 tablet (100 mg total) by mouth 2 (two) times daily. (Patient not taking: Reported on 10/01/2019)   Facility-Administered Encounter Medications as of 10/01/2019  Medication  . sodium chloride flush (NS) 0.9 % injection 3 mL     Patient Active Problem List   Diagnosis Date Noted  . Gangrene of foot ([place]) 08/07/2019  . Normocytic anemia 08/07/2019  . 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 Critical lower  limb ischemia 08/03/2019  . Encounter for immunization 07/09/2016  . Essential hypertension 03/31/2013  . Dyslipidemia 03/31/2013  . DM2 (diabetes mellitus, type 2) ([place]) 03/31/2013  . Overweight (BMI 25.0-29.9) 03/31/2013  . Carotid artery disease ([place]) 03/31/2013  . PAD (peripheral artery disease) ([place]) 03/31/2013     Health Maintenance Due  Topic Date Due  . OPHTHALMOLOGY EXAM  03/14/1952  . TETANUS/TDAP  03/14/1961  . DEXA SCAN  03/15/2007  . PNA vac Low Risk Adult (1 of 2 - PCV13) 03/15/2007  . FOOT EXAM  05/02/2019    Social History   Tobacco Use  . Smoking status: Former Smoker    Quit date: 03/31/1972    Years since  quitting: 47.5  . Smokeless tobacco: Never Used  Substance Use Topics  . Alcohol use: No  . Drug use: No    family history includes Cancer - Colon in her father; Cancer - Prostate in her father; Hyperlipidemia in her mother; Hypertension in her mother; Melanoma in her brother; Stroke in her mother. Review of Systems Review of Systems  Constitutional: Negative for fever, chills, diaphoresis, activity change, appetite change, fatigue and unexpected weight change.  HENT: Negative for congestion, sore throat, rhinorrhea, sneezing, trouble swallowing and sinus pressure.  Eyes: Negative for photophobia and visual disturbance.  Respiratory: Negative for cough, chest tightness, shortness of breath, wheezing and stridor.  Cardiovascular: Negative for chest pain, palpitations and leg swelling.  Gastrointestinal: Negative for nausea, vomiting, abdominal pain, diarrhea, constipation, blood in stool, abdominal distention and anal bleeding.  Genitourinary: Negative for dysuria, hematuria, flank pain and difficulty urinating.  Musculoskeletal: Negative for myalgias, back pain, joint swelling, arthralgias and gait problem.  Skin: Negative for color change, pallor, rash and wound.  Neurological: Negative for dizziness, tremors, weakness and light-headedness.  Hematological: Negative for adenopathy. Does not bruise/bleed easily.  Psychiatric/Behavioral: Negative for behavioral problems, confusion, sleep disturbance, dysphoric mood, decreased concentration and agitation.    Physical Exam   BP (!) 148/74   Pulse 91   Temp 98 F (36.7 C)   Wt 135 lb 3.2 oz (61.3 kg)   BMI 22.85 kg/m    No results found for: CD4TCELL No results found for: CD4TABS No results found for: HIV1RNAQUANT No results found for: HEPBSAB No results found for: RPR, LABRPR  CBC Lab Results  Component Value Date   WBC 11.5 (H) 08/07/2019   RBC 3.55 (L) 08/07/2019   HGB 10.4 (L) 08/07/2019   HCT 31.9 (L) 08/07/2019   PLT  502 (H) 08/07/2019   MCV 89.9 08/07/2019   MCH 29.3 08/07/2019   MCHC 32.6 08/07/2019   RDW 14.0 08/07/2019    BMET Lab Results  Component Value Date   NA 138 08/08/2019   K 3.8 08/08/2019   CL 100 08/08/2019   CO2 26 08/08/2019   GLUCOSE 152 (H) 08/08/2019   BUN 17 08/08/2019   CREATININE 0.92 09/23/2019   CALCIUM 9.1 08/08/2019   GFRNONAA >60 09/23/2019   GFRAA >60 09/23/2019     Assessment and Plan  Hx of PsA, serratia, 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 Not improved.  Will establish with picc line and plan to do meropenem (given R PsA) and daptomycin at 8mg /kg once a day.  [facility]  Spent 45 min with review of records, and coordination of plan

## 2019-10-01 NOTE — Progress Notes (Signed)
Referral faxed to [facility] with signed orders  patient demographics/insurnace/notes/ date and time of Picc placement (receiving first dose at short stay)  Orders faxed to Short Stay and LPN called [id number] to schedule Picc placement. Appointment scheduled for Tuesday 10/06/19 at 12pm .   Called patient and made aware of appointment date/time and notified to arrive at 11:45am.  Eugenia Mcalpine

## 2019-10-02 DIAGNOSIS — D649 Anemia, unspecified: Secondary | ICD-10-CM | POA: Diagnosis not present

## 2019-10-02 DIAGNOSIS — K219 Gastro-esophageal reflux disease without esophagitis: Secondary | ICD-10-CM | POA: Diagnosis not present

## 2019-10-02 DIAGNOSIS — Z4781 Encounter for orthopedic aftercare following surgical amputation: Secondary | ICD-10-CM | POA: Diagnosis not present

## 2019-10-02 DIAGNOSIS — E785 Hyperlipidemia, unspecified: Secondary | ICD-10-CM | POA: Diagnosis not present

## 2019-10-02 DIAGNOSIS — I10 Essential (primary) hypertension: Secondary | ICD-10-CM | POA: Diagnosis not present

## 2019-10-02 DIAGNOSIS — E1152 Type 2 diabetes mellitus with diabetic peripheral angiopathy with gangrene: Secondary | ICD-10-CM | POA: Diagnosis not present

## 2019-10-02 NOTE — Progress Notes (Addendum)
MELENDA, BIELAK ([id number]) Visit Report for 10/01/2019 Arrival Information Details Patient Name: Date of Service: [name], [name] 10/01/2019 9:00 AM Medical Record Number:[id number] Patient Account Number: [id number] Date of Birth/Sex: Treating RN: [birth year] (79 y.o. [name] Primary Care Sheilah Rayos: [name] Other Clinician: Referring Demaryius Imran: Treating Jessie Cowher/[name]:[name], [name], [name] Weeks in Treatment: 11 Visit Information History Since Last Visit Added or deleted any medications: No Patient Arrived: Walker Any new allergies or adverse reactions: No Arrival Time: 09:14 Had a fall or experienced change in No Accompanied By: self activities of daily living that may affect Transfer Assistance: None risk of falls: Patient Identification Verified: Yes Signs or symptoms of abuse/neglect since last No Secondary Verification Process Yes visito Completed: Hospitalized since last visit: No Patient Requires Transmission- No Implantable device outside of the clinic excluding No Based Precautions: cellular tissue based products placed in the center Patient Has Alerts: Yes since last visit: Patient Alerts: Right ABI .70 TBI Has Dressing in Place as Prescribed: Yes refused Pain Present Now: No Left ABI .60 TBI .14 Electronic Signature(s) Signed: 10/01/2019 5:21:35 PM By: Kela Millin Entered By: Kela Millin on 10/01/2019 09:15:18 -------------------------------------------------------------------------------- Encounter Discharge Information Details Patient Name: Date of Service: [name] 10/01/2019 9:00 AM Medical Record Number:[id number] Patient Account Number: [id number] Date of Birth/Sex: Treating RN: Apr 02, 1941 (79 y.o. [name] Primary Care Varick Keys: [name] Other Clinician: Referring Tylee Newby: Treating Tylen Leverich/[name]:[name], [name], [name] Weeks in Treatment: 80 Encounter  Discharge Information Items Post Procedure Vitals Discharge Condition: Stable Temperature (F): 98 Ambulatory Status: Walker Pulse (bpm): 80 Discharge Destination: Home Respiratory Rate (breaths/min): 18 Transportation: Private Auto Blood Pressure (mmHg): 129/39 Accompanied By: self Schedule Follow-up Appointment: Yes Clinical Summary of Care: Patient Declined Electronic Signature(s) Signed: 10/01/2019 5:27:53 PM By: Baruch Gouty RN, BSN Entered By: Baruch Gouty on 10/01/2019 10:37:38 -------------------------------------------------------------------------------- Lower Extremity Assessment Details Patient Name: Date of Service: [name] 10/01/2019 9:00 AM Medical Record Number:[id number] Patient Account Number: [id number] Date of Birth/Sex: Treating RN: Sep 13, 1941 (79 y.o. [name] Primary Care Glendoris Nodarse: [name] Other Clinician: Referring Larone Kliethermes: Treating Luceil Herrin/[name]:[name], [name], [name] Weeks in Treatment: 11 Edema Assessment Assessed: [Left: No] [Right: No] Edema: [Left: No] [Right: No] Calf Left: Right: Point of Measurement: 39 cm From Medial Instep 29 cm 28 cm Ankle Left: Right: Point of Measurement: 10 cm From Medial Instep 18 cm 19 cm Vascular Assessment Pulses: Dorsalis Pedis Palpable: [Left:Yes] [Right:Yes] Electronic Signature(s) Signed: 10/01/2019 5:21:35 PM By: Kela Millin Entered By: Kela Millin on 10/01/2019 09:18:20 -------------------------------------------------------------------------------- Multi Wound Chart Details Patient Name: Date of Service: [name] 10/01/2019 9:00 AM Medical Record Number:[id number] Patient Account Number: [id number] Date of Birth/Sex: Treating RN: Aug 16, 1941 (79 y.o. 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] [7:Lower Extremity] [8:Lower Extremity] Secondary Etiology: [1:Arterial Insufficiency Ulcer N/A] [8:N/A] Comorbid History: [1:Deep Vein Thrombosis, Hypertension, Peripheral Hypertension, Peripheral Hypertension, Peripheral Arterial Disease, Type II Diabetes] [7:Deep Vein Thrombosis, Arterial Disease, Type II Arterial Disease, Type II Diabetes] [8:Deep Vein  Thrombosis, Diabetes] Date Acquired: [1:09/16/2018] [7:08/07/2019] [8:09/10/2019] Weeks of  Treatment: [1:11] [7:5] [8:3] Wound Status: [1:Open] [7:Open] [8:Open] Measurements L x W x D 0.5x0.5x0.1 [7:1.5x7.9x1.3] [8:0.6x0.5x0.1] (cm) Area (cm) : [1:0.196] [7:9.307] [8:0.236] Volume (cm) : [1:0.02] [7:12.099] [8:0.024] % Reduction in Area: [1:72.30%] [7:10.20%] [8:46.40%] % Reduction in Volume: 71.80% [7:-1066.70%] [8:45.50%] Classification: [1:Grade 2] [7:Grade 4] [8:Grade 2] Exudate Amount: [1:Small] [7:Medium] [8:None Present] Exudate Type: [1:Serosanguineous] [7:Purulent] [8:N/A] Exudate Color: [1:red, brown] [7:yellow, brown, green] [8:N/A] Wound Margin: [1:Distinct, outline attached Well defined, not attached Distinct, outline attached] Granulation Amount: [1:Medium (34-66%)] [7:Small (1-33%)] [8:None Present (0%)] Granulation Quality: [1:Pink] [7:Pale] [8:N/A] Necrotic Amount: [1:Medium (34-66%)] [7:Medium (34-66%)] [8:Medium (34-66%)] Necrotic Tissue: [1:Adherent Slough] [7:Eschar, Adherent Slough Eschar] Exposed  Structures: [1:Fat Layer (Subcutaneous Fat Layer (Subcutaneous Fascia: No Tissue) Exposed: Yes Bone: Yes Fascia: No Tendon: No Muscle: No Joint: No] [7:Tissue) Exposed: Yes Bone: Yes Fascia: No Tendon: No Muscle: No Joint: No] [8:Fat Layer (Subcutaneous Tissue)  Exposed: No Tendon: No Muscle: No Joint: No Bone: No] Epithelialization: [1:Small (1-33%)] [7:None] [8:None] Debridement: [1:N/A] [7:Debridement - Excisional N/A] Pre-procedure [1:N/A] [7:10:01] [8:N/A] Verification/Time Out Taken: Pain Control: [1:N/A] [7:Lidocaine Injectable] [8:N/A] Tissue Debrided: [1:N/A] [7:Bone, Fat, Subcutaneous N/A] Level: [1:N/A] [7:Skin/Subcutaneous Tissue/Muscle/Bone] [8:N/A] Debridement Area (sq cm):N/A [7:1] [8:N/A] Instrument: [1:N/A] [7:Rongeur] [8:N/A] Specimen: [1:N/A] [7:Swab] [8:N/A] Number of Specimens [1:N/A] [7:2] [8:N/A] Taken: Bleeding: [1:N/A] [7:Minimum] [8:N/A] Hemostasis Achieved: [1:N/A] [7:Pressure] [8:N/A] Procedural Pain: [1:N/A] [7:0] [8:N/A] Post Procedural Pain: [1:N/A] [7:0] [8:N/A] Debridement Treatment N/A [7:Procedure was tolerated] [8:N/A] Response: [7:well] Post Debridement [1:N/A] [7:1.5x7.9x1.3] [8:N/A] Measurements L x W x D (cm) Post Debridement [1:N/A] [7:12.099] [8:N/A] Volume: (cm) Procedures Performed: N/A [7:Debridement] [8:N/A] Treatment Notes Wound #1 (Left, Dorsal Toe Fourth) 3. 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 Primary Dressing Applied [employer] 4. Secondary Dressing ABD Pad Dry Gauze Roll Gauze Notes netting Wound #8 (Left Toe Third) 3. Primary Dressing Applied Foam 5. Secured With Tape Notes Horticulturist, commercial) Signed: 10/02/2019 4:55:01 AM By: [name] MD Entered By: [name] on 10/01/2019 13:02:06 -------------------------------------------------------------------------------- Multi-Disciplinary Care Plan Details Patient Name: Date  of Service: [name] 10/01/2019 9:00 AM Medical Record Number:[id number] Patient Account Number: [id number] Date of Birth/Sex: Treating RN: 10-04-1940 (79 y.o. 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 Potential for barotraumas to ears, sinuses, teeth, and lungs or cerebral gas embolism related to changes in atmospheric pressure inside hyperbaric oxygen chamber Goals: Barotrauma will be prevented during HBO2 Date Initiated: 10/01/2019 Target Resolution Date: 10/30/2019 Goal Status: Active Patient and/or family will be able to state/discuss factors appropriate to the management of their disease process during treatment Date Initiated: 10/01/2019 Target Resolution Date: 10/31/2019 Goal Status: Active Patient/caregiver will verbalize understanding of HBO goals, rationale, procedures and potential hazards Date Initiated: 10/01/2019 Target Resolution Date: 10/30/2019 Goal Status: Active Interventions: Assess and provide for patients comfort related to the hyperbaric environment and equalization of middle ear Assess patient for any history of confinement anxiety Notes: Abuse / Safety / Falls / Self Care Management Nursing Diagnoses: Potential for falls Goals: Patient will remain injury free related to falls Date Initiated: 08/27/2019 Target Resolution Date: 10/02/2019 Goal Status: Active Interventions: Provide education on fall prevention Notes: Pain, Acute or Chronic Nursing Diagnoses: Pain, acute or chronic: actual or potential Potential alteration in comfort, pain Goals: Patient will verbalize adequate pain control  and receive pain control interventions during procedures as needed Date Initiated: 08/27/2019 Target  Resolution Date: 10/02/2019 Goal Status: Active Interventions: Encourage patient to take pain medications as prescribed Provide education on pain management Reposition patient for comfort Notes: Electronic Signature(s) Signed: 10/01/2019 5:19:45 PM By: Deon Pilling Entered By: Deon Pilling on 10/01/2019 10:00:52 -------------------------------------------------------------------------------- Pain Assessment Details Patient Name: Date of Service: JOLLY, CARLINI 10/01/2019 9:00 AM Medical Record Number:[id number] Patient Account Number: [id number] Date of Birth/Sex: Treating RN: 11-15-1940 (79 y.o. [name] Primary Care Aiman Noe: [name] Other Clinician: Referring Quenisha Lovins: Treating Navil Kole/[name]:[name], [name], [name] Weeks in Treatment: 11 Active Problems Location of Pain Severity and Description of Pain Patient Has Paino No Site Locations Pain Management and Medication Current Pain Management: Electronic Signature(s) Signed: 10/01/2019 5:21:35 PM By: Kela Millin Entered By: Kela Millin on 10/01/2019 09:16:39 -------------------------------------------------------------------------------- Patient/Caregiver Education Details Patient Name: Date of Service: [name], [name] H. 1/7/2021andnbsp9:00 AM Medical Record Number:[id number] Patient Account Number: [id number] Date of Birth/Gender: Treating RN: 04-23-1941 (79 y.o. [name] Primary Care Physician: [name] Other Clinician: Referring Physician: Treating Physician/[name]:[name], [name], [name] in Treatment: 11 Education Assessment Education Provided To: Patient Education Topics Provided Hyperbaric Oxygenation: Handouts: Hyperbaric Oxygen Methods: Explain/Verbal, Printed Responses: Reinforcements needed Electronic Signature(s) Signed: 10/01/2019 5:19:45 PM By: Deon Pilling Entered By: Deon Pilling on 10/01/2019  10:01:10 -------------------------------------------------------------------------------- Wound Assessment Details Patient Name: Date of Service: [name], [name] 10/01/2019 9:00 AM Medical Record Number:[id number] Patient Account Number: [id number] Date of Birth/Sex: Treating RN: 1941/02/25 (79 y.o. [name] Primary Care Jackalyn Haith: [name] Other Clinician: Referring Shaleen Talamantez: Treating Avelynn Sellin/[name]:[name], [name], [name] Weeks in Treatment: 11 Wound Status Wound Number: 1 Primary Diabetic Wound/Ulcer of the Lower Extremity Etiology: Wound Location: Left Toe Fourth - Dorsal Secondary Arterial Insufficiency Ulcer Wounding Event: Blister Etiology: Date Acquired: 09/16/2018 Wound Open Weeks Of Treatment: 11 Status: Clustered Wound: No Comorbid Deep Vein Thrombosis, Hypertension, History: Peripheral Arterial Disease, Type II Diabetes Photos Wound Measurements Length: (cm) 0.5 % Reductio Width: (cm) 0.5 % Reductio Depth: (cm) 0.1 Epithelial Area: (cm) 0.196 Tunneling Volume: (cm) 0.02 Undermini Wound Description Classification: Grade 2 Wound Margin: Distinct, outline attached Exudate Amount: Small Exudate Type: Serosanguineous Exudate Color: red, brown Wound Bed Granulation Amount: Medium (34-66%) Granulation Quality: Pink Necrotic Amount: Medium (34-66%) Necrotic Quality: Adherent Slough Foul Odor After Cleansing: No Slough/Fibrino Yes Exposed Structure Fascia Exposed: No Fat Layer (Subcutaneous Tissue) Exposed: Yes Tendon Exposed: No Muscle Exposed: No Joint Exposed: No Bone Exposed: Yes n in Area: 72.3% n in Volume: 71.8% ization: Small (1-33%) : No ng: No Treatment Notes Wound #1 (Left, Dorsal Toe Fourth) 3. Primary Dressing Applied [employer] Notes netting Electronic Signature(s) Signed: 10/02/2019 2:58:22 PM By: Kela Millin Signed: 10/02/2019 3:20:21 PM By: [name] EMT/HBOT Previous Signature:  10/01/2019 5:21:35 PM Version By: Kela Millin Entered By: [name] on 10/02/2019 11:51:38 -------------------------------------------------------------------------------- Wound Assessment Details Patient Name: Date of Service: [name] 10/01/2019 9:00 AM Medical Record Number:[id number] Patient Account Number: [id number] Date of Birth/Sex: Treating RN: 04/26/1941 (79 y.o. [name] Primary Care Gumecindo Hopkin: [name] Other Clinician: Referring Ramona Slinger: Treating Lenus Trauger/[name]:[name], [name], [name] Weeks in Treatment: 11 Wound Status Wound Number: 7 Primary Diabetic Wound/Ulcer of the Lower Extremity Etiology: Wound Location: Right Amputation Site - Transmetatarsal Wound Open Status: Wounding Event: Surgical Injury Comorbid Deep Vein Thrombosis, Hypertension, Date Acquired: 08/07/2019 History: Peripheral Arterial Disease, Type II Diabetes Weeks Of Treatment: 5 Clustered Wound: No Photos Wound Measurements Length: (cm) 1.5 Width: (  cm) 7.9 Depth: (cm) 1.3 Area: (cm) 9.307 Volume: (cm) 12.099 Wound Description Classification: Grade 4 Wound Margin: Well defined, not attached Exudate Amount: Medium Exudate Type: Purulent Exudate Color: yellow, brown, green Wound Bed Granulation Amount: Small (1-33%) Granulation Quality: Pale Necrotic Amount: Medium (34-66%) Necrotic Quality: Eschar, Adherent Slough fter Cleansing: No ino Yes Exposed Structure sed: No Subcutaneous Tissue) Exposed: Yes sed: No sed: No ed: No d: Yes % Reduction in Area: 10.2% % Reduction in Volume: -1066.7% Epithelialization: None Tunneling: No Undermining: No Foul Odor A Slough/Fibr Fascia Expo Fat Layer ( Tendon Expo Muscle Expo Joint Expos Bone Expose Treatment Notes 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 Primary Dressing Applied [employer] 4. Secondary Dressing ABD Pad Dry Gauze Roll  Gauze Notes netting Electronic Signature(s) Signed: 10/02/2019 2:58:22 PM By: Kela Millin Signed: 10/02/2019 3:20:21 PM By: [name] EMT/HBOT Previous Signature: 10/01/2019 5:21:35 PM Version By: Kela Millin Entered By: [name] on 10/02/2019 11:52:21 -------------------------------------------------------------------------------- Wound Assessment Details Patient Name: Date of Service: [name] 10/01/2019 9:00 AM Medical Record Number:[id number] Patient Account Number: [id number] Date of Birth/Sex: Treating RN: 02/02/1941 (79 y.o. [name] Primary Care Nayellie Sanseverino: [name] Other Clinician: Referring Bobetta Korf: Treating Wilhemina Grall/[name]:[name], [name], [name] Weeks in Treatment: 11 Wound Status Wound Number: 8 Primary Diabetic Wound/Ulcer of the Lower Extremity Etiology: Wound Location: Left Toe Third Wound Open Wounding Event: Gradually Appeared Status: Date Acquired: 09/10/2019 Comorbid Deep Vein Thrombosis, Hypertension, Weeks Of Treatment: 3 History: Peripheral Arterial Disease, Type II Diabetes Clustered Wound: No Photos Wound Measurements Length: (cm) 0.6 Width: (cm) 0.5 Depth: (cm) 0.1 Area: (cm) 0.236 Volume: (cm) 0.024 Wound Description Classification: Grade 2 Wound Margin: Distinct, outline attached Exudate Amount: None Present Wound Bed Granulation Amount: None Present (0%) Necrotic Amount: Medium (34-66%) Necrotic Quality: Eschar After Cleansing: No brino No Exposed Structure posed: No (Subcutaneous Tissue) Exposed: No posed: No posed: No osed: No sed: No % Reduction in Area: 46.4% % Reduction in Volume: 45.5% Epithelialization: None Tunneling: No Undermining: No Foul Odor Slough/Fi Fascia Ex Fat Layer Tendon Ex Muscle Ex Joint Exp Bone Expo Treatment Notes Wound #8 (Left Toe Third) 3. Primary Dressing Applied Foam 5. Secured With Tape Notes Careers adviser) Signed: 10/02/2019 2:58:22 PM By: Kela Millin Signed: 10/02/2019 3:20:21 PM By: [name] EMT/HBOT Previous Signature: 10/01/2019 5:21:35 PM Version By: Kela Millin Entered By: [name] on 10/02/2019 11:51:15 -------------------------------------------------------------------------------- Vitals Details Patient Name: Date of Service: [name] 10/01/2019 9:00 AM Medical Record Number:[id number] Patient Account Number: [id number] Date of Birth/Sex: Treating RN: 07/23/1941 (79 y.o. [name] Primary Care Nasiir Monts: [name] Other Clinician: Referring Caileigh Canche: Treating Avyan Livesay/[name]:[name], [name], [name] Weeks in Treatment: 11 Vital Signs Time Taken: 09:15 Temperature (F): 98 Height (in): 64 Pulse (bpm): 80 Weight (lbs): 142 Respiratory Rate (breaths/min): 18 Body Mass Index (BMI): 24.4 Blood Pressure (mmHg): 129/39 Reference Range: 80 - 120 mg / dl Electronic Signature(s) Signed: 10/01/2019 5:21:35 PM By: Kela Millin Entered By: Kela Millin on 10/01/2019 09:16:33

## 2019-10-02 NOTE — Progress Notes (Addendum)
[name], [name] ([id number]) Visit Report for 10/01/2019 Debridement Details Patient Name: Date of Service: [name], [name] 10/01/2019 9:00 AM Medical Record Number:[id number] Patient Account Number: [id number] Date of Birth/Sex: Treating RN: 09-18-1941 (79 y.o. F) Primary Care Provider: [name] Other Clinician: Referring Provider: Treating Provider/[name]:Emmitte Surgeon, [name], [name] Weeks in Treatment: 11 Debridement Performed for Wound #7 Right Amputation Site - Transmetatarsal Assessment: Performed By: Physician Ricard Dillon., MD Debridement Type: Debridement Severity of Tissue Pre Bone involvement without necrosis Debridement: Level of Consciousness (Pre- Awake and Alert procedure): Pre-procedure Verification/Time Out Taken: Yes - 10:01 Start Time: 10:02 Pain Control: Lidocaine Injectable : 1 Total Area Debrided (L x W): 1 (cm) x 1 (cm) = 1 (cm) Tissue and other material Viable, Non-Viable, Bone, Fat, Subcutaneous, Skin: Dermis debrided: Level: Skin/Subcutaneous Tissue/Muscle/Bone Debridement Description: Excisional Instrument: Rongeur Specimen: Swab, Tissue Culture Number of Specimens Taken: 2 Bleeding: Minimum Hemostasis Achieved: Pressure End Time: 10:07 Procedural Pain: 0 Post Procedural Pain: 0 Response to Treatment: Procedure was tolerated well Level of Consciousness Awake and Alert (Post-procedure): Post Debridement Measurements of Total Wound Length: (cm) 1.5 Width: (cm) 7.9 Depth: (cm) 1.3 Volume: (cm) 12.099 Character of Wound/Ulcer Post Debridement: Requires Further Debridement Severity of Tissue Post Debridement: Bone involvement without necrosis Post Procedure Diagnosis Same as Pre-procedure Electronic Signature(s) Signed: 10/02/2019 4:55:01 AM By: [name] MD Entered By: [name] on 10/01/2019 13:02:22 -------------------------------------------------------------------------------- HPI Details Patient  Name: Date of Service: [name] 10/01/2019 9:00 AM Medical Record Number:[id number] Patient Account Number: [id number] Date of Birth/Sex: Treating RN: [birth year] (79 y.o. F) Primary Care Provider: [name] Other Clinician: Referring Provider: Treating Provider/[name]:Omero Kowal, [name], [name] Weeks in Treatment: 11 History of Present Illness HPI Description: ADMISSION 07/13/2019 Patient is a 79 year old type II diabetic. She has known PAD. She has been followed by Dr. Jacqualyn Posey of podiatry for blistering areas on her toes dating back to 04/30/2019 which at this was the left fourth toe. By 8/11 she had wounds on the right and left second toes. She underwent arterial studies by Dr. Alvester Chou on 7/31 that showed ABIs on the right of 0.60 TBI of 0.26 on the left ABI of 0.56 and a TBI of 0.25. By 9/10 she had ischemic-looking wounds per Dr. Earleen Newport on the right first, left first second and third. She has been using Medihoney and then mupirocin more recently simply Betadine. The patient underwent angiography by Dr. Gwenlyn Found on 9/21. 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 Noted that there was 1 vessel runoff in the foot via the peroneal. On the left there was an 80% left SFA, 70% mid left SFA. There was a short segment calcified CTO distal less than SFA and above-knee popliteal artery reconstituting with two-vessel runoff. 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 It was felt that she had bilateral SFA disease as well as tibial vessel disease. An attempt was made to revascularize the left SFA but they were unable to cross because of the highly calcified nature of the lesion. 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 She has an area on the  medial left fourth toe with raised horned callus on top of this. I am not certain what this represents. With regards to pain she has about a 2-1/2 I  will claudication tolerance in the grocery store. She has some pain in night which is relieved by putting her feet down over the side of the bed. She is wearing Nitro-Dur patches on the top of her right foot. Past medical history includes type 2 diabetes with secondary PAD, neoplasm of the skin, diabetic retinopathy, carotid artery stenosis, hyperlipidemia hypertension. 11/2; the last time the patient was here I spoke to Dr. Gwenlyn Found about revascularization options on the right. As I understand things currently Dr. Gwenlyn Found spoke with Dr. Trula Slade and ultimately the patient was taken to surgery on 10/27. She had a right iliofemoral endarterectomy with a bovine patch angioplasty. I think the plan now is for her to have a staged intervention on the right SFA by Dr. Alvester Chou. Per the patient's understanding Dr. Gwenlyn Found and Dr. Jacqualyn Posey are waiting to see when the gangrenous toes on the right foot can be amputated. The patient states her pain is a lot better and she is grateful for that. 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 Small area on the left fourth toe. She is using Betadine to the mummified areas on the right and Medihoney on the left 08/27/2019; the last time I saw this patient I discharged her from the clinic. She had been revascularized by Dr. Trula Slade and she had a right iliofemoral endarterectomy with a bovine angioplasty. 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 Jacqualyn Posey of podiatry on 08/07/2019. I note that she also had a intervention by Dr. Gwenlyn Found and he performed a directional atherectomy and drug- coated balloon angioplasty of the SFA and popliteal artery on the right. I am not certain of the date of Dr. Kennon Holter procedure as of the time of this dictation. She was  referred back to [place] by Dr. Earleen Newport predominantly for follow-up of the left fourth toe. She still has sutures and stitches in the right TMA site. She states her pain is a lot better. She expresses concern about the condition of the amputation site at the TMA. She is on doxycycline I think prescribed by Dr. Earleen Newport. She is complaining of some pain at night 12/10; I spoke to Dr. Jacqualyn Posey last week. He removed the sutures on the right foot on Monday of this week. She has the area on the left fourth toe just proximal to the PIP and then the right TMA site. She is still on doxycycline and has enough through next week. Unfortunately the TMA site does not look good at all. Both on the lateral and medial part of the incisions are areas that probe to bone. There is purulence over the medial part which I have cultured. We will use silver alginate. Left fourth toe looks somewhat better but there was still exposed bone 12/17; patient has an MRI booked for 12/30. Culture I did last week showed Pseudomonas Serratia and Enterococcus. This was purulent drainage coming out of the medial part of her amputation site. I use cefdinir 300 twice daily for 10 days that started on 12/15. Her x-ray on the right showed limited evaluation for osteomyelitis. The findings could have been postoperative. There was subtle erosion in the distal first and distal fifth metatarsal. An MRI was suggested. On the left she had irregularity of the fourth middle and proximal phalanx consistent with a history of osteomyelitis. I do not know that she has a history of osteomyelitis in this area. She had a newly defined area on the  plantar third toe 12/31; patient's MRI is listed below: ********************************************************************************************************************************************************************** MRI OF THE RIGHT FOREFOOT WITHOUT AND WITH CONTRAST TECHNIQUE: Multiplanar, multisequence MR imaging  of the right forefoot was performed before and after the administration of intravenous contrast. CONTRAST: 6 mL GADAVIST IV SOLN COMPARISON: Plain films right foot 09/04/2019. FINDINGS: Bones/Joint/Cartilage The patient is status post transmetatarsal amputation as seen on the prior exam. Marrow edema and enhancement are seen in all of the distal metatarsals. In the first metatarsal, signal change extends 1.5 cm proximal to the stump and in the second metatarsal extends approximately 2 cm proximal to the stump. Edema and enhancement are seen in only the distal 0.7 cm of the third metatarsal stump and tips of the fourth and fifth metatarsals. Bone marrow signal is otherwise unremarkable. A small focus of subchondral edema is seen in the lateral talus, likely degenerative. Ligaments Intact. Muscles and Tendons No intramuscular fluid collection. Soft tissues Skin ulceration is seen off the stump of the first metatarsal. A thin fluid collection tracks deep to the wound and over the anterior metatarsals worrisome for small abscess. Intense subcutaneous edema and enhancement are seen diffusely. 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 Cellulitis about the foot. Skin ulceration over the distal first metatarsal is identified with a thin fluid collection tracking anteriorly along the stump worrisome for abscess. Small focus of subchondral edema in the lateral talus is likely degenerative. Electronically Signed By: Inge Rise M.D. On: 09/23/2019 15:25 ********************************************************************************************************************************************************************** Patient arrives in clinic today not complaining any of any pain. We have been using silver alginate to the predominant areas in  the surgical site on her right transmetatarsal amputation. She does not describe claudication however her activity is very limited. 10/01/2019; since the patient was last here I have communicated with Dr. Gwenlyn Found after bypass by Dr. Trula Slade and addressing the right superficial femoral artery he states that she is widely patent through peroneal arteries to the ankle with collaterals to the dorsalis pedis. He states he is going to talk to colleagues about the feasibility of tibial pedal access. The patient seems infectious disease later this afternoon Dr. Baxter Flattery. in preparation for this she has been off antibiotics for 1 week and I went ahead and obtain pieces of the remanence of her first metatarsal for pathology and CandS. The patient is a candidate for hyperbaric oxygen. 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 Electronic Signature(s) Signed: 10/02/2019 4:55:01 AM By: [name] MD Entered By: [name] on 10/01/2019 13:06:05 -------------------------------------------------------------------------------- Physical Exam Details Patient Name: Date of Service: [name] 10/01/2019 9:00 AM Medical Record Number:[id number] Patient Account Number: [id number] Date of Birth/Sex: Treating RN: [birth year] (79 y.o. F) Primary Care Provider: [name] Other Clinician: Referring Provider: Treating Provider/[name]:Caylin Raby, [name], [name] Weeks in Treatment: 11 Constitutional Sitting or standing Blood Pressure is within target range for patient.. Pulse regular and within target range for patient.Marland Kitchen Respirations regular, non- labored and within target range.. Temperature is normal and within the target range for the patient.Marland Kitchen Appears in no distress. Notes Wound exam; right TMA site medially still exposed bone which I think is probably the first metatarsal. the area was anesthetized with injectable lidocaine and samples of bone  for pathology and CNS were obtained. I was able to remove most of this from the wound bed. The area laterally looks better. She has small areas on  the left fourth toe. The tip of the third toe is eschared. No debridement was done here. Electronic Signature(s) Signed: 10/02/2019 4:55:01 AM By: [name] MD Entered By: [name] on 10/01/2019 13:07:31 -------------------------------------------------------------------------------- Physician Orders Details Patient Name: Date of Service: [name] 10/01/2019 9:00 AM Medical Record Number:[id number] Patient Account Number: [id number] Date of Birth/Sex: Treating RN: [birth year] (79 y.o. [name], [name] Primary Care Provider: [name] Other Clinician: Referring Provider: Treating Provider/[name]:Saidee Geremia, [name], [name] Weeks in Treatment: 87 Verbal / Phone Orders: No Diagnosis Coding ICD-10 Coding Code Description E11.621 Type 2 diabetes mellitus with foot ulcer E11.52 Type 2 diabetes mellitus with diabetic peripheral angiopathy with gangrene L97.518 Non-pressure chronic ulcer of other part of right foot with other specified severity L97.528 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ity E11.42 Type 2 diabetes mellitus with diabetic polyneuropathy Follow-up Appointments Return Appointment in 1 week. - Thursday Dressing Change Frequency Change Dressing every other day. - home health to change twice a week. wound center weekly. Wound Cleansing Clean wound with Wound Cleanser Primary Wound Dressing Wound #1 Left,Dorsal Toe Fourth Silver Collagen - moisten with hydrogel. Wound #7 Right Amputation Site - Transmetatarsal Silver Collagen - moisten with hydrogel. Wound #8 Left Toe Third Foam - or gauze to pad for protection. Secondary Dressing Wound #1 Left,Dorsal Toe Fourth Kerlix/Rolled Gauze Wound #7 Right Amputation Site - Transmetatarsal Kerlix/Rolled  Gauze ABD pad Wound #8 Left Toe Third Kerlix/Rolled Gauze Dry Gauze Edema Control Avoid standing for long periods of time Elevate legs to the level of the heart or above for 30 minutes daily and/or when sitting, a frequency of: - throughout the day. Off-Loading DH Walker Boot to: - right foot [facility] skilled nursing for wound care. - [facility] twice a week. Hyperbaric Oxygen Therapy Evaluate for HBO Therapy Indication: - wagner grade diabetic foot ulcer right foot If appropriate for treatment, begin HBOT per protocol: 2.5 ATA for 90 Minutes with 2 Five (5) Minute Air Breaks Total Number of Treatments: - 40 One treatments per day (delivered Monday through Friday unless otherwise specified in Special Instructions below): Antihistamine 30 minutes prior to HBO Treatment, difficulty clearing ears. Finger stick Blood Glucose Pre- and Post- HBOT Treatment. Follow Hyperbaric Oxygen Glycemia Protocol Laboratory Bacteria identified in Tissue by Biopsy culture (MICRO) - bone biopsy of right foot transmetatarsal wound- first met head. - (ICD10 E11.621 - Type 2 diabetes mellitus with foot ulcer) LOINC Code: 86578-4 Convenience Name: Biopsy specimen culture Bacteria identified in Unspecified specimen by Aerobe culture (MICRO) - culture of right foot transmetatarsal wound- first met head. - (ICD10 E11.621 - Type 2 diabetes mellitus with foot ulcer) LOINC Code: 696-2 Convenience Name: Areobic culture-specimen not specified GLYCEMIA INTERVENTIONS PROTOCOL PRE-HBO GLYCEMIA INTERVENTIONS ACTION INTERVENTION Obtain pre-HBO capillary blood 1 glucose (ensure physician order is in chart). A. Notify HBO physician and await physician orders. 2 If result is 70 mg/dl or below: B. If the result meets the hospital definition of a critical result, follow hospital policy. A. Give patient an 8 ounce Glucerna Shake, an 8 ounce Ensure, or 8 ounces of a  Glucerna/Ensure equivalent dietary supplement*. B. Wait 30 minutes. C. Retest patients capillary blood If result is 71 mg/dl to 130 mg/dl: glucose (CBG). D. If result greater than or equal to 110 mg/dl, proceed with HBO. If result less than 110 mg/dl, notify HBO physician and consider holding HBO. If result is 131 mg/dl to 249 mg/dl: A.  Proceed with HBO. A. Notify HBO physician and await physician orders. B. It is recommended to hold HBO If result is 250 mg/dl or greater: and do blood/urine ketone testing. C. If the result meets the hospital definition of a critical result, follow hospital policy. POST-HBO GLYCEMIA INTERVENTIONS ACTION INTERVENTION Obtain post HBO capillary blood 1 glucose (ensure physician order is in chart). A. Notify HBO physician and await physician orders. 2 If result is 70 mg/dl or below: B. If the result meets the hospital definition of a critical result, follow hospital policy. A. Give patient an 8 ounce Glucerna Shake, an 8 ounce Ensure, or 8 ounces of a Glucerna/Ensure equivalent dietary supplement*. B. Wait 15 minutes for symptoms of hypoglycemia (i.e. nervousness, anxiety, sweating, chills, If result is 71 mg/dl to 100 mg/dl: clamminess, irritability, confusion, tachycardia or dizziness). C. If patient asymptomatic, discharge patient. If patient symptomatic, repeat capillary blood glucose (CBG) and notify HBO physician. If result is 101 mg/dl to 249 mg/dl: A. Discharge patient. A. Notify HBO physician and await physician orders. B. It is recommended to do If result is 250 mg/dl or greater: blood/urine ketone testing. C. If the result meets the hospital definition of a critical result, follow hospital policy. *Juice or candies are NOT equivalent products. If patient refuses the Glucerna or Ensure, please consult the hospital dietitian for an appropriate substitute. Electronic Signature(s) Signed: 10/07/2019 8:31:59 AM By: [name] MD Previous Signature: 10/06/2019 6:12:59 PM Version By: Baruch Gouty RN, BSN Previous Signature: 10/06/2019 6:16:53 PM Version By: [name] MD Previous Signature: 10/01/2019 5:19:45 PM Version By: Deon Pilling Previous Signature: 10/02/2019 4:55:01 AM Version By: [name] MD Entered By: [name] on 10/07/2019 08:31:58 -------------------------------------------------------------------------------- Problem List Details Patient Name: Date of Service: [name] 10/01/2019 9:00 AM Medical Record Number:[id number] Patient Account Number: [id number] Date of Birth/Sex: Treating RN: 05-28-1941 (79 y.o. [name], [name] Primary Care Provider: [name] Other Clinician: Referring Provider: Treating Provider/[name]:Ramla Hase, [name], [name] Weeks in Treatment: 11 Active Problems ICD-10 Evaluated Encounter Code Description Active Date Today Diagnosis E11.621 Type 2 diabetes mellitus with foot ulcer 07/13/2019 No Yes E11.52 Type 2 diabetes mellitus with diabetic peripheral angiopathy with 07/13/2019 No Yes gangrene L97.518 Non-pressure chronic ulcer of other part of right foot with other 07/13/2019 No Yes specified severity L97.528 Non-pressure chronic ulcer of other part of left foot with other specified 07/13/2019 No Yes severity E11.42 Type 2 diabetes mellitus with diabetic polyneuropathy 07/13/2019 No Yes 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  F) Primary Care Provider: [name] Other Clinician: Referring Provider: Treating Provider/[name]:Jolayne Branson, [name], [name] Weeks in Treatment: 11 Subjective History of Present Illness (HPI) ADMISSION 07/13/2019 Patient is a 79 year old type II diabetic. She has known PAD. She has been followed by Dr. Jacqualyn Posey of podiatry for blistering areas on her toes dating back to 04/30/2019 which at this was the left fourth toe. By 8/11 she had wounds on the right and left second toes. She underwent arterial studies by Dr. Alvester Chou on 7/31 that showed ABIs on the right of 0.60 TBI of 0.26 on the left ABI of 0.56 and a TBI of 0.25. By 9/10 she had ischemic-looking wounds  per Dr. Earleen Newport on the right first, left first second and third. She has been using Medihoney and then mupirocin more recently simply Betadine. The patient underwent angiography by Dr. Gwenlyn Found on 9/21. 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 Noted that there was 1 vessel runoff in the foot via the peroneal. ooOn the left there was an 80% left SFA, 70% mid left SFA. There was a short segment calcified CTO distal less than SFA and above-knee popliteal artery reconstituting with two-vessel runoff. 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 It was felt that she had bilateral SFA disease as well as tibial vessel disease. An attempt was made to revascularize the left SFA but they were unable to cross because of the highly calcified nature of the lesion. 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 She has an area on the medial left fourth toe with raised horned callus on top of this. I am not certain what this represents. With regards to pain she has about a 2-1/2 I will claudication  tolerance in the grocery store. She has some pain in night which is relieved by putting her feet down over the side of the bed. She is wearing Nitro-Dur patches on the top of her right foot. Past medical history includes type 2 diabetes with secondary PAD, neoplasm of the skin, diabetic retinopathy, carotid artery stenosis, hyperlipidemia hypertension. 11/2; the last time the patient was here I spoke to Dr. Gwenlyn Found about revascularization options on the right. As I understand things currently Dr. Gwenlyn Found spoke with Dr. Trula Slade and ultimately the patient was taken to surgery on 10/27. She had a right iliofemoral endarterectomy with a bovine patch angioplasty. I think the plan now is for her to have a staged intervention on the right SFA by Dr. Alvester Chou. Per the patient's understanding Dr. Gwenlyn Found and Dr. Jacqualyn Posey are waiting to see when the gangrenous toes on the right foot can be amputated. The patient states her pain is a lot better and she is grateful for that. 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 Small area on the left fourth toe. She is using Betadine to the mummified areas on the right and Medihoney on the left 08/27/2019; the last time I saw this patient I discharged her from the clinic. She had been revascularized by Dr. Trula Slade and she had a right iliofemoral endarterectomy with a bovine angioplasty. 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 Jacqualyn Posey of podiatry on 08/07/2019. I note that she also had a intervention by Dr. Gwenlyn Found and he performed a directional atherectomy and drug- coated balloon angioplasty of the SFA and popliteal artery on the right. I am not certain of the date of Dr. Kennon Holter procedure as of the time of this dictation. She was referred back to [place] by Dr. Earleen Newport predominantly for follow-up of the left fourth toe. She still has sutures and stitches in the right TMA site. She states her pain is a lot  better. She expresses concern about the condition of the amputation site at the TMA. She is on doxycycline I think prescribed by Dr. Earleen Newport. She is complaining of some pain at night 12/10; I spoke to Dr. Jacqualyn Posey last  week. He removed the sutures on the right foot on Monday of this week. She has the area on the left fourth toe just proximal to the PIP and then the right TMA site. She is still on doxycycline and has enough through next week. Unfortunately the TMA site does not look good at all. Both on the lateral and medial part of the incisions are areas that probe to bone. There is purulence over the medial part which I have cultured. We will use silver alginate. Left fourth toe looks somewhat better but there was still exposed bone 12/17; patient has an MRI booked for 12/30. Culture I did last week showed Pseudomonas Serratia and Enterococcus. This was purulent drainage coming out of the medial part of her amputation site. I use cefdinir 300 twice daily for 10 days that started on 12/15. Her x-ray on the right showed limited evaluation for osteomyelitis. The findings could have been postoperative. There was subtle erosion in the distal first and distal fifth metatarsal. An MRI was suggested. On the left she had irregularity of the fourth middle and proximal phalanx consistent with a history of osteomyelitis. I do not know that she has a history of osteomyelitis in this area. She had a newly defined area on the plantar third toe 12/31; patient's MRI is listed below: ********************************************************************************************************************************************************************** MRI OF THE RIGHT FOREFOOT WITHOUT AND WITH CONTRAST TECHNIQUE: Multiplanar, multisequence MR imaging of the right forefoot was performed before and after the administration of intravenous contrast. CONTRAST: 6 mL GADAVIST IV SOLN COMPARISON: Plain films right foot  09/04/2019. FINDINGS: Bones/Joint/Cartilage The patient is status post transmetatarsal amputation as seen on the prior exam. Marrow edema and enhancement are seen in all of the distal metatarsals. In the first metatarsal, signal change extends 1.5 cm proximal to the stump and in the second metatarsal extends approximately 2 cm proximal to the stump. Edema and enhancement are seen in only the distal 0.7 cm of the third metatarsal stump and tips of the fourth and fifth metatarsals. Bone marrow signal is otherwise unremarkable. A small focus of subchondral edema is seen in the lateral talus, likely degenerative. Ligaments Intact. Muscles and Tendons No intramuscular fluid collection. Soft tissues Skin ulceration is seen off the stump of the first metatarsal. A thin fluid collection tracks deep to the wound and over the anterior metatarsals worrisome for small abscess. Intense subcutaneous edema and enhancement are seen diffusely. 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 Cellulitis about the foot. Skin ulceration over the distal first metatarsal is identified with a thin fluid collection tracking anteriorly along the stump worrisome for abscess. Small focus of subchondral edema in the lateral talus is likely degenerative. Electronically Signed By: Inge Rise M.D. On: 09/23/2019 15:25 ********************************************************************************************************************************************************************** Patient arrives in clinic today not complaining any of any pain. 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 She does not describe claudication however her activity is very limited. 10/01/2019; since the patient was  last here I have communicated with Dr. Gwenlyn Found after bypass by Dr. Trula Slade and addressing the right superficial femoral artery he states that she is widely patent through peroneal arteries to the ankle with collaterals to the dorsalis pedis. He states he is going to talk to colleagues about the feasibility of tibial pedal access. The patient seems infectious disease later this afternoon Dr. Baxter Flattery. in preparation  for this she has been off antibiotics for 1 week and I went ahead and obtain pieces of the remanence of her first metatarsal for pathology and CandS. The patient is a candidate for hyperbaric oxygen. 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 Objective Constitutional Sitting or standing Blood Pressure is within target range for patient.. Pulse regular and within target range for patient.Marland Kitchen Respirations regular, non- labored and within target range.. Temperature is normal and within the target range for the patient.Marland Kitchen Appears in no distress. Vitals Time Taken: 9:15 AM, Height: 64 in, Weight: 142 lbs, BMI: 24.4, Temperature: 98 F, Pulse: 80 bpm, Respiratory Rate: 18 breaths/min, Blood Pressure: 129/39 mmHg. General Notes: Wound exam; right TMA site medially still exposed bone which I think is probably the first metatarsal. the area was anesthetized with injectable lidocaine and samples of bone for pathology and CNS were obtained. I was able to remove most of this from the wound bed. The area laterally looks better. ooShe has small areas on the left fourth toe. The tip of the third toe is eschared. No debridement was done here. Integumentary (Hair, Skin) Wound #1 status is Open. Original cause of wound was Blister. The wound is located on the Left,Dorsal Toe Fourth. The wound measures 0.5cm length x 0.5cm width x 0.1cm depth; 0.196cm^2 area and 0.02cm^3 volume. There is bone and Fat Layer (Subcutaneous Tissue) Exposed exposed. There is no tunneling  or undermining noted. There is a small amount of serosanguineous drainage noted. The wound margin is distinct with the outline attached to the wound base. There is medium (34-66%) pink granulation within the wound bed. There is a medium (34-66%) amount of necrotic tissue within the wound bed including Adherent Slough. Wound #7 status is Open. Original cause of wound was Surgical Injury. 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 The wound measures 1.5cm length x 7.9cm width x 1.3cm depth; 9.307cm^2 area and 12.099cm^3 volume. There is bone and Fat Layer (Subcutaneous Tissue) Exposed exposed. There is no tunneling or undermining noted. There is a medium amount of purulent drainage noted. The wound margin is well defined and not attached to the wound base. There is small (1-33%) pale granulation within the wound bed. There is a medium (34-66%) amount of necrotic tissue within the wound bed including Eschar and Adherent Slough. Wound #8 status is Open. Original cause of wound was Gradually Appeared. The wound is located on the Left Toe Third. The wound measures 0.6cm length x 0.5cm width x 0.1cm depth; 0.236cm^2 area and 0.024cm^3 volume. There is no tunneling or undermining noted. There is a none present amount of drainage noted. The wound margin is distinct with the outline attached to the wound base. There is no granulation within the wound bed. There is a medium (34-66%) amount of necrotic tissue within the wound bed including Eschar. 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 Firstly on 07/21/2019 she had a right common femoral endarterectomy with patch angioplasty by vein and vascular. Subsequently on 08/03/2019 she had a drug-coated balloon angioplasty of her right SFA popliteal  and tibial peroneal trunk. She is still being considered for retrograde attempts to open her anterior tibial artery Nutritional management Patient does not have any overt nutritional insufficiency that should preclude healing of a right transmetatarsal amputation site Glucose control Hemoglobin A1c of 6.7 on 07/21/19 Debridement wound bed management The area in question is the right foot transmetatarsal amputation site. It is open in 2 areas, medially and laterally. Medially there is still exposed bone she had gross necrotic material when she first came to our clinic. This is undergone aggressive debridements including on the last visit, bone debridement with specimens obtained for pathology and bone culture. Offloading surgical shoe. not a weight bearing surface Control of infection The patient received roughly 3 weeks of doxycycline from 08/18/2019 until 09/03/2019 prescribed empirically by podiatry. She received 10 days of cefdinir in response to swab culture showing Pseudomonas and Serratia while we awaited MRI and infectious disease consultation. She is now being started on meropenem and daptomycin as directed by infectious disease MRI results MRI of the right foot on 09/23/2019 showed marrow edema and enhancement in all the distal metatarsals. Findings were consistent with osteomyelitis in the distal metatarsals most extensive in the first and second. osteomyelitis in the distal metatarsals most extensive in the first and second. Labs reviewed On 11/12 white count 11.5 hemoglobin 10.4 Plan of care/Summary This is a 79 year old woman who initially presented to podiatry with wounds on her right greater  than left foot. She was discovered to have critical limb ischemia and underwent revascularization procedures by both vein and vascular and interventional cardiology. She is still being considered for additional retrograde attempts to open her anterior tibial artery. She underwent a right TMA on 08/07/2019 for dry gangrene. Unfortunately her surgical incision dehisced and she had 2 wounds in the surgical site the medial line going directly to bone. Her bone cultures have been positive for gram-negative rods, bone pathology compatible with osteomyelitis. MRI also showed osteomyelitis. She is currently on IV antibiotics after failing at least a month of oral antibiotics. She is resistant to the notion of a below-knee amputation on the right, she would not be a candidate for foot preserving procedures otherwise secondary to severe PAD. We will initiate treatment with hyperbaric oxygen at 2.5 atm with 2 air breaks for 40 treatments. Goal here will be resolution of the osteomyelitis, an attempt at wound closure, and limb salvage. She will also be followed in the wound care clinic for aggressive wound management. She will also be followed by interventional cardiology Procedures Wound #7 Pre-procedure diagnosis of Wound #7 is a Diabetic Wound/Ulcer of the Lower Extremity located on the Right Amputation Site - Transmetatarsal .Severity of Tissue Pre Debridement is: Bone involvement without necrosis. There was a Excisional Skin/Subcutaneous Tissue/Muscle/Bone Debridement with a total area of 1 sq cm performed by Ricard Dillon., MD. With the following instrument(s): Rongeur to remove Viable and Non-Viable tissue/material. Material removed includes Bone,Fat, Subcutaneous Tissue, and Skin: Dermis after achieving pain control using Lidocaine Injectable: 1%. 2 specimens were taken by a Swab and Tissue Culture and sent to the lab per facility protocol. A time out was conducted at 10:01, prior to the start of the  procedure. A Minimum amount of bleeding was controlled with Pressure. The procedure was tolerated well with a pain level of 0 throughout and a pain level of 0 following the procedure. Post Debridement Measurements: 1.5cm length x 7.9cm width x 1.3cm depth; 12.099cm^3 volume. Character of Wound/Ulcer Post Debridement requires further debridement.  Severity of Tissue Post Debridement is: Bone involvement without necrosis. Post procedure Diagnosis Wound #7: Same as Pre-Procedure Plan Follow-up Appointments: Return Appointment in 1 week. - Thursday Dressing Change Frequency: Change Dressing every other day. - home health to change twice a week. wound center weekly. Wound Cleansing: Clean wound with Wound Cleanser Primary Wound Dressing: Wound #1 Left,Dorsal Toe Fourth: Silver Collagen - moisten with hydrogel. Wound #7 Right Amputation Site - Transmetatarsal: Silver Collagen - moisten with hydrogel. Wound #8 Left Toe Third: Foam - or gauze to pad for protection. Secondary Dressing: Wound #1 Left,Dorsal Toe Fourth: Kerlix/Rolled Gauze Wound #7 Right Amputation Site - Transmetatarsal: Kerlix/Rolled Gauze ABD pad Wound #8 Left Toe Third: Kerlix/Rolled Gauze Dry Gauze Edema Control: Avoid standing for long periods of time Elevate legs to the level of the heart or above for 30 minutes daily and/or when sitting, a frequency of: - throughout the day. Off-Loading: DH Walker Boot to: - right foot Home Health: [facility] for wound care. - [facility] twice a week. Hyperbaric Oxygen Therapy: Evaluate for HBO Therapy Indication: - wagner grade diabetic foot ulcer right foot If appropriate for treatment, begin HBOT per protocol: 2.5 ATA for 90 Minutes with 2 Five (5) Minute Air Breaks Total Number of Treatments: - 40 One treatments per day (delivered Monday through Friday unless otherwise specified in Special Instructions below): Antihistamine 30 minutes  prior to HBO Treatment, difficulty clearing ears. Finger stick Blood Glucose Pre- and Post- HBOT Treatment. Follow Hyperbaric Oxygen Glycemia Protocol Laboratory ordered were: Biopsy specimen culture - bone biopsy of right foot transmetatarsal wound- first met head., Areobic culture-specimen not specified - culture of right foot transmetatarsal wound- first met head. 1. We are going to use silver collagen to all wound areas moistened with hydrogel 2. I will reach out to Dr. Baxter Flattery to let her know that bone has been obtained for CandS and pathology 3. I have gone over things again with the patient she still wants to pursue an aggressive course of action to try and save her right leg. Toward that end I have also ordered hyperbaric oxygen 2.5 atm with 5-minute air breaks x2 #40 treatments. This is in addition to antibiotics that are will be ordered by infectious disease. Goal here is to close the surgical wound at the right TMA site contributing towards ultimate limb salvage Electronic Signature(s) Signed: 10/09/2019 8:22:59 AM By: [name] MD Previous Signature: 10/08/2019 2:59:24 PM Version By: [name] MD Previous Signature: 10/07/2019 8:32:26 AM Version By: [name] MD Previous Signature: 10/06/2019 6:12:59 PM Version By: Baruch Gouty RN, BSN Previous Signature: 10/06/2019 6:16:53 PM Version By: [name] MD Previous Signature: 10/06/2019 7:54:45 AM Version By: [name] MD Previous Signature: 10/02/2019 4:55:01 AM Version By: [name] MD Entered By: [name] on 10/09/2019 08:22:58 -------------------------------------------------------------------------------- SuperBill Details Patient Name: Date of Service: [name], [name] 10/01/2019 Medical Record Number:[id number] Patient Account Number: [id number] Date of Birth/Sex: Treating RN: 06/22/1941 (79 y.o. [name], [name] Primary Care Provider: [name] Other Clinician: Referring Provider:  Treating Provider/[name]:Maizie Garno, [name], [name] Weeks in Treatment: 11 Diagnosis Coding ICD-10 Codes Code Description E11.621 Type 2 diabetes mellitus with foot ulcer E11.52 Type 2 diabetes mellitus with diabetic peripheral angiopathy with gangrene L97.518 Non-pressure chronic ulcer of other part of right foot with other specified severity L97.528 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ity E11.42 Type 2 diabetes mellitus with diabetic polyneuropathy Facility Procedures The patient participates  with Medicare or their insurance follows the Medicare Facility Guidelines: CPT4 Code Description Modifier Quantity [id number] - DEB BONE 20 SQ CM/< 1 ICD-10 Diagnosis Description E11.621 Type 2 diabetes mellitus with foot  ulcer Physician Procedures CPT4: Code [id number] D Description: ebridement; bone (includes epidermis, dermis, subQ tissue, muscle and/or fascia, if performed) 1st 20 sqcm or less ICD-10 Diagnosis Description E11.621 Type 2 diabetes mellitus with foot ulcer Modifier: Quantity: 1 Electronic Signature(s) Signed: 10/02/2019 4:55:01 AM By: [name] MD Entered By: [name] on 10/01/2019 13:09:25

## 2019-10-05 DIAGNOSIS — K219 Gastro-esophageal reflux disease without esophagitis: Secondary | ICD-10-CM | POA: Diagnosis not present

## 2019-10-05 DIAGNOSIS — E1152 Type 2 diabetes mellitus with diabetic peripheral angiopathy with gangrene: Secondary | ICD-10-CM | POA: Diagnosis not present

## 2019-10-05 DIAGNOSIS — I10 Essential (primary) hypertension: Secondary | ICD-10-CM | POA: Diagnosis not present

## 2019-10-05 DIAGNOSIS — E785 Hyperlipidemia, unspecified: Secondary | ICD-10-CM | POA: Diagnosis not present

## 2019-10-05 DIAGNOSIS — Z4781 Encounter for orthopedic aftercare following surgical amputation: Secondary | ICD-10-CM | POA: Diagnosis not present

## 2019-10-05 DIAGNOSIS — D649 Anemia, unspecified: Secondary | ICD-10-CM | POA: Diagnosis not present

## 2019-10-05 LAB — AEROBIC CULTURE W GRAM STAIN (SUPERFICIAL SPECIMEN): Gram Stain: NONE SEEN

## 2019-10-06 ENCOUNTER — Encounter (HOSPITAL_COMMUNITY)
Admission: RE | Admit: 2019-10-06 | Discharge: 2019-10-06 | Disposition: A | Discharge status: Home / Self Care | Payer: Medicare Other | Source: Ambulatory Visit | Attending: Internal Medicine | Admitting: Internal Medicine

## 2019-10-06 ENCOUNTER — Other Ambulatory Visit: Payer: Self-pay

## 2019-10-06 ENCOUNTER — Ambulatory Visit (HOSPITAL_COMMUNITY)
Admission: RE | Admit: 2019-10-06 | Discharge: 2019-10-06 | Disposition: A | Discharge status: Home / Self Care | Payer: Medicare Other | Source: Ambulatory Visit | Attending: Internal Medicine | Admitting: Internal Medicine

## 2019-10-06 ENCOUNTER — Other Ambulatory Visit: Payer: Self-pay | Admitting: Internal Medicine

## 2019-10-06 DIAGNOSIS — Z452 Encounter for adjustment and management of vascular access device: Secondary | ICD-10-CM | POA: Diagnosis not present

## 2019-10-06 DIAGNOSIS — M86071 Acute hematogenous osteomyelitis, right ankle and foot: Secondary | ICD-10-CM | POA: Diagnosis not present

## 2019-10-06 DIAGNOSIS — M869 Osteomyelitis, unspecified: Secondary | ICD-10-CM

## 2019-10-06 MED ORDER — LIDOCAINE HCL 1 % IJ SOLN
INTRAMUSCULAR | Status: AC
  Filled 2019-10-06: qty 20

## 2019-10-06 MED ORDER — HEPARIN SOD (PORK) LOCK FLUSH 100 UNIT/ML IV SOLN: 250.0000 [IU] | INTRAVENOUS | Status: DC | PRN

## 2019-10-06 MED ORDER — SODIUM CHLORIDE 0.9 % IV SOLN
340.0000 mg | Freq: Once | INTRAVENOUS | Status: AC
  Administered 2019-10-06: 340 mg via INTRAVENOUS
  Filled 2019-10-06: qty 6.8

## 2019-10-06 MED ORDER — SODIUM CHLORIDE 0.9 % IV SOLN
1.0000 g | Freq: Once | INTRAVENOUS | Status: AC
  Administered 2019-10-06: 1 g via INTRAVENOUS
  Filled 2019-10-06: qty 1

## 2019-10-06 MED ORDER — HEPARIN SOD (PORK) LOCK FLUSH 100 UNIT/ML IV SOLN
INTRAVENOUS | Status: AC
  Administered 2019-10-06: 250 [IU]
  Filled 2019-10-06: qty 5

## 2019-10-06 MED ORDER — DAPTOMYCIN IV (FOR PTA / DISCHARGE USE ONLY): 340.0000 mg | Freq: Once | INTRAVENOUS | Status: DC

## 2019-10-06 MED ORDER — LIDOCAINE HCL (PF) 1 % IJ SOLN
INTRAMUSCULAR | Status: AC | PRN
  Administered 2019-10-06: 10 mL

## 2019-10-06 MED ORDER — HEPARIN SOD (PORK) LOCK FLUSH 100 UNIT/ML IV SOLN: 250.0000 [IU] | Freq: Every day | INTRAVENOUS | Status: DC

## 2019-10-06 NOTE — Procedures (Signed)
Successful placement of single lumen PICC line to left basilic vein. Length 36 cm Tip at lower SVC/RA PICC capped No complications Ready for use.  EBL < 5 mL   Ascencion Dike PA-C 10/06/2019 12:46 PM

## 2019-10-07 DIAGNOSIS — I10 Essential (primary) hypertension: Secondary | ICD-10-CM | POA: Diagnosis not present

## 2019-10-07 DIAGNOSIS — E785 Hyperlipidemia, unspecified: Secondary | ICD-10-CM | POA: Diagnosis not present

## 2019-10-07 DIAGNOSIS — D649 Anemia, unspecified: Secondary | ICD-10-CM | POA: Diagnosis not present

## 2019-10-07 DIAGNOSIS — Z4781 Encounter for orthopedic aftercare following surgical amputation: Secondary | ICD-10-CM | POA: Diagnosis not present

## 2019-10-07 DIAGNOSIS — E1152 Type 2 diabetes mellitus with diabetic peripheral angiopathy with gangrene: Secondary | ICD-10-CM | POA: Diagnosis not present

## 2019-10-07 DIAGNOSIS — K219 Gastro-esophageal reflux disease without esophagitis: Secondary | ICD-10-CM | POA: Diagnosis not present

## 2019-10-08 ENCOUNTER — Other Ambulatory Visit: Payer: Self-pay

## 2019-10-08 ENCOUNTER — Inpatient Hospital Stay (HOSPITAL_COMMUNITY): Admission: RE | Admit: 2019-10-08 | Payer: Medicare Other | Source: Ambulatory Visit

## 2019-10-08 ENCOUNTER — Encounter (HOSPITAL_BASED_OUTPATIENT_CLINIC_OR_DEPARTMENT_OTHER): Payer: Medicare Other | Admitting: Internal Medicine

## 2019-10-08 ENCOUNTER — Encounter (HOSPITAL_COMMUNITY): Payer: Medicare Other

## 2019-10-08 ENCOUNTER — Other Ambulatory Visit (HOSPITAL_COMMUNITY): Payer: Self-pay | Admitting: Cardiovascular Disease

## 2019-10-08 ENCOUNTER — Ambulatory Visit (HOSPITAL_COMMUNITY)
Admission: RE | Admit: 2019-10-08 | Discharge: 2019-10-08 | Disposition: A | Discharge status: Home / Self Care | Payer: Medicare Other | Source: Ambulatory Visit | Attending: Cardiology | Admitting: Cardiology

## 2019-10-08 DIAGNOSIS — I6523 Occlusion and stenosis of bilateral carotid arteries: Secondary | ICD-10-CM

## 2019-10-09 ENCOUNTER — Encounter (HOSPITAL_BASED_OUTPATIENT_CLINIC_OR_DEPARTMENT_OTHER): Payer: Medicare Other | Admitting: Internal Medicine

## 2019-10-09 DIAGNOSIS — I6523 Occlusion and stenosis of bilateral carotid arteries: Secondary | ICD-10-CM

## 2019-10-09 DIAGNOSIS — L97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 limited to breakdown of skin: Secondary | ICD-10-CM | POA: Diagnosis not present

## 2019-10-09 DIAGNOSIS — E1152 Type 2 diabetes mellitus with diabetic peripheral angiopathy with gangrene: Secondary | ICD-10-CM | POA: Diagnosis not present

## 2019-10-09 DIAGNOSIS — L97512 Non-pressure chronic ulcer of other part of right foot with fat layer exposed: Secondary | ICD-10-CM | POA: Diagnosis not present

## 2019-10-09 DIAGNOSIS — E11621 Type 2 diabetes mellitus with foot ulcer: Secondary | ICD-10-CM | POA: Diagnosis not present

## 2019-10-09 DIAGNOSIS — L97522 Non-pressure chronic ulcer of other part of left foot with fat layer exposed: Secondary | ICD-10-CM | POA: Diagnosis not present

## 2019-10-09 DIAGNOSIS — E11319 Type 2 diabetes mellitus with unspecified diabetic retinopathy without macular edema: Secondary | ICD-10-CM | POA: Diagnosis not present

## 2019-10-09 DIAGNOSIS — T8189XA Other complications of procedures, not elsewhere classified, initial encounter: Secondary | ICD-10-CM | POA: Diagnosis not present

## 2019-10-09 NOTE — Progress Notes (Addendum)
ALONIA, DIBUONO ([id number]) Visit Report for 10/09/2019 Debridement Details Patient Name: Date of Service: [name], [name] 10/09/2019 2:15 PM Medical Record Number:[id number] Patient Account Number: [id number] Date of Birth/Sex: Treating RN: November 16, 1940 (79 y.o. F) Primary Care Provider: [name] Other Clinician: Referring Provider: Treating Provider/[name]:Tashina Credit, [name], [name] Weeks in Treatment: 12 Debridement Performed for Wound #7 Right Amputation Site - Transmetatarsal Assessment: Performed By: Physician Ricard Dillon., MD Debridement Type: Debridement Severity of Tissue Pre Fat layer exposed Debridement: Level of Consciousness (Pre- Awake and Alert procedure): Pre-procedure Verification/Time Out Taken: Yes - 15:40 Start Time: 15:40 Pain Control: Other : benzocaine 20% spray Total Area Debrided (L x W): 1 (cm) x 7.5 (cm) = 7.5 (cm) Tissue and other material Viable, Non-Viable, Slough, Subcutaneous, Slough debrided: Level: Skin/Subcutaneous Tissue Debridement Description: Excisional Instrument: Curette Bleeding: Minimum Hemostasis Achieved: Pressure End Time: 15:41 Procedural Pain: 0 Post Procedural Pain: 0 Response to Treatment: Procedure was tolerated well Level of Consciousness Awake and Alert (Post-procedure): Post Debridement Measurements of Total Wound Length: (cm) 1 Width: (cm) 7.5 Depth: (cm) 0.5 Volume: (cm) 2.945 Character of Wound/Ulcer Post Debridement: Improved Severity of Tissue Post Debridement: Fat layer exposed Post Procedure Diagnosis Same as Pre-procedure Electronic Signature(s) Signed: 10/09/2019 6:20:19 PM By: [name] MD Entered By: [name] on 10/09/2019 15:48:22 -------------------------------------------------------------------------------- HPI Details Patient Name: Date of Service: [name] 10/09/2019 2:15 PM Medical Record Number:[id number] Patient Account Number: [id number] Date of  Birth/Sex: Treating RN: Dec 21, 1940 (79 y.o. F) Primary Care Provider: [name] Other Clinician: Referring Provider: Treating Provider/[name]:Kimaya Whitlatch, [name], [name] Weeks in Treatment: 12 History of Present Illness HPI Description: ADMISSION 07/13/2019 Patient is a 79 year old type II diabetic. She has known PAD. She has been followed by Dr. Jacqualyn Posey of podiatry for blistering areas on her toes dating back to 04/30/2019 which at this was the left fourth toe. By 8/11 she had wounds on the right and left second toes. She underwent arterial studies by Dr. Alvester Chou on 7/31 that showed ABIs on the right of 0.60 TBI of 0.26 on the left ABI of 0.56 and a TBI of 0.25. By 9/10 she had ischemic-looking wounds per Dr. Earleen Newport on the right first, left first second and third. She has been using Medihoney and then mupirocin more recently simply Betadine. The patient underwent angiography by Dr. Gwenlyn Found on 9/21. 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 Noted that there was 1 vessel runoff in the foot via the peroneal. On the left there was an 80% left SFA, 70% mid left SFA. There was a short segment calcified CTO distal less than SFA and above-knee popliteal artery reconstituting with two-vessel runoff. 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 It was felt that she had bilateral SFA disease as well as tibial vessel disease. An attempt was made to revascularize the left SFA but they were unable to cross because of the highly calcified nature of the lesion. 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 She has an area on the medial left fourth toe with raised horned callus on top of this. I am not certain what this represents. With regards to pain she has  about a 2-1/2 I will claudication tolerance in the grocery store. She has some pain in night which  is relieved by putting her feet down over the side of the bed. She is wearing Nitro-Dur patches on the top of her right foot. Past medical history includes type 2 diabetes with secondary PAD, neoplasm of the skin, diabetic retinopathy, carotid artery stenosis, hyperlipidemia hypertension. 11/2; the last time the patient was here I spoke to Dr. Gwenlyn Found about revascularization options on the right. As I understand things currently Dr. Gwenlyn Found spoke with Dr. Trula Slade and ultimately the patient was taken to surgery on 10/27. She had a right iliofemoral endarterectomy with a bovine patch angioplasty. I think the plan now is for her to have a staged intervention on the right SFA by Dr. Alvester Chou. Per the patient's understanding Dr. Gwenlyn Found and Dr. Jacqualyn Posey are waiting to see when the gangrenous toes on the right foot can be amputated. The patient states her pain is a lot better and she is grateful for that. 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 Small area on the left fourth toe. She is using Betadine to the mummified areas on the right and Medihoney on the left 08/27/2019; the last time I saw this patient I discharged her from the clinic. She had been revascularized by Dr. Trula Slade and she had a right iliofemoral endarterectomy with a bovine angioplasty. 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 Jacqualyn Posey of podiatry on 08/07/2019. I note that she also had a intervention by Dr. Gwenlyn Found and he performed a directional atherectomy and drug- coated balloon angioplasty of the SFA and popliteal artery on the right. I am not certain of the date of Dr. Kennon Holter procedure as of the time of this dictation. She was referred back to [place] by Dr. Earleen Newport predominantly for follow-up of the left fourth toe. She still has sutures and stitches in the right TMA  site. She states her pain is a lot better. She expresses concern about the condition of the amputation site at the TMA. She is on doxycycline I think prescribed by Dr. Earleen Newport. She is complaining of some pain at night 12/10; I spoke to Dr. Jacqualyn Posey last week. He removed the sutures on the right foot on Monday of this week. She has the area on the left fourth toe just proximal to the PIP and then the right TMA site. She is still on doxycycline and has enough through next week. Unfortunately the TMA site does not look good at all. Both on the lateral and medial part of the incisions are areas that probe to bone. There is purulence over the medial part which I have cultured. We will use silver alginate. Left fourth toe looks somewhat better but there was still exposed bone 12/17; patient has an MRI booked for 12/30. Culture I did last week showed Pseudomonas Serratia and Enterococcus. This was purulent drainage coming out of the medial part of her amputation site. I use cefdinir 300 twice daily for 10 days that started on 12/15. Her x-ray on the right showed limited evaluation for osteomyelitis. The findings could have been postoperative. There was subtle erosion in the distal first and distal fifth metatarsal. An MRI was suggested. On the left she had irregularity of the fourth middle and proximal phalanx consistent with a history of osteomyelitis. I do not know that she has a history of osteomyelitis in this area. She had a newly defined area on the plantar third toe 12/31; patient's MRI is listed below: ********************************************************************************************************************************************************************** MRI OF THE RIGHT  FOREFOOT WITHOUT AND WITH CONTRAST TECHNIQUE: Multiplanar, multisequence MR imaging of the right forefoot was performed before and after the administration of intravenous contrast. CONTRAST: 6 mL GADAVIST IV  SOLN COMPARISON: Plain films right foot 09/04/2019. FINDINGS: Bones/Joint/Cartilage The patient is status post transmetatarsal amputation as seen on the prior exam. Marrow edema and enhancement are seen in all of the distal metatarsals. In the first metatarsal, signal change extends 1.5 cm proximal to the stump and in the second metatarsal extends approximately 2 cm proximal to the stump. Edema and enhancement are seen in only the distal 0.7 cm of the third metatarsal stump and tips of the fourth and fifth metatarsals. Bone marrow signal is otherwise unremarkable. A small focus of subchondral edema is seen in the lateral talus, likely degenerative. Ligaments Intact. Muscles and Tendons No intramuscular fluid collection. Soft tissues Skin ulceration is seen off the stump of the first metatarsal. A thin fluid collection tracks deep to the wound and over the anterior metatarsals worrisome for small abscess. Intense subcutaneous edema and enhancement are seen diffusely. 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 Cellulitis about the foot. Skin ulceration over the distal first metatarsal is identified with a thin fluid collection tracking anteriorly along the stump worrisome for abscess. Small focus of subchondral edema in the lateral talus is likely degenerative. Electronically Signed By: Inge Rise M.D. On: 09/23/2019 15:25 ********************************************************************************************************************************************************************** Patient arrives in clinic today not complaining any of any pain. 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 She does not describe claudication however her activity is very  limited. 10/01/2019; since the patient was last here I have communicated with Dr. Gwenlyn Found after bypass by Dr. Trula Slade and addressing the right superficial femoral artery he states that she is widely patent through peroneal arteries to the ankle with collaterals to the dorsalis pedis. He states he is going to talk to colleagues about the feasibility of tibial pedal access. The patient seems infectious disease later this afternoon Dr. Baxter Flattery. in preparation for this she has been off antibiotics for 1 week and I went ahead and obtain pieces of the remanence of her first metatarsal for pathology and CandS. The patient is a candidate for hyperbaric oxygen. 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 1/15; considerable improvement in the wounds on her feet. We are using silver collagen. She follows with Dr. Baxter Flattery of infectious disease and is on meropenem and daptomycin. She has been taught how to do this herself at home. Follows with Dr. Graylon Good at the end of treatment here. She has 2 wounds on the surgical TMA site 1 lateral and 1 medial the lateral 1 has regressed tremendously. The area medially still has some exposed bone although the base of this looks healthy. Electronic Signature(s) Signed: 10/09/2019 6:20:19 PM By: [name] MD Entered By: [name] on 10/09/2019 15:50:01 -------------------------------------------------------------------------------- Physical Exam Details Patient Name: Date of Service: [name] 10/09/2019 2:15 PM Medical Record Number:[id number] Patient Account Number: [id number] Date of Birth/Sex: Treating RN: [birth year] (79 y.o. F) Primary Care Provider: [name] Other Clinician: Referring Provider: Treating Provider/[name]:Nakia Remmers, [name], [name] Weeks in Treatment: 12 Constitutional Sitting or standing Blood Pressure is within target range for patient.. Pulse regular and within target  range for patient.Marland Kitchen Respirations regular, non- labored and within target range.. Temperature is  normal and within the target range for the patient.Marland Kitchen Appears in no distress. Notes Wound exam; right TMA site medially still exposed bone but the tissue generally looks a lot healthier. Using a #3 curette I removed fibrinous surface debris from the wound there is bleeding controlled with direct pressure. The area on the TMA site laterally is now much smaller. She has a small area on the fourth toe which is a lot better and the tip of the left third toe also is better in fact looks almost completely epithelialized Electronic Signature(s) Signed: 10/09/2019 6:20:19 PM By: [name] MD Entered By: [name] on 10/09/2019 15:50:56 -------------------------------------------------------------------------------- Physician Orders Details Patient Name: Date of Service: [name] 10/09/2019 2:15 PM Medical Record Number:[id number] Patient Account Number: [id number] Date of Birth/Sex: Treating RN: 19-Jan-1941 (79 y.o. 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 Other: - To start HBO next week Dressing Change Frequency Change Dressing every other day. - home health to change twice a  week. wound center weekly. Wound Cleansing Clean wound with Wound Cleanser Primary Wound Dressing Wound #1 Left,Dorsal Toe Fourth Silver Collagen - moisten with hydrogel. Wound #7 Right Amputation Site - Transmetatarsal Silver Collagen - moisten with hydrogel. Wound #8 Left Toe Third Foam - or gauze to pad for protection. Secondary Dressing Wound #1 Left,Dorsal Toe Fourth Kerlix/Rolled Gauze Wound #7 Right Amputation Site - Transmetatarsal Kerlix/Rolled Gauze ABD pad Wound #8 Left Toe Third Kerlix/Rolled Gauze Dry Gauze Edema Control Avoid standing for long periods of time Elevate legs to the level of the heart or above for 30 minutes daily and/or when sitting, a frequency of: - throughout the day. Off-Loading DH Walker Boot to: - right foot [facility] for wound care. - [facility] twice a week. Hyperbaric Oxygen Therapy Evaluate for HBO Therapy Indication: - wagner grade diabetic foot ulcer right foot If appropriate for treatment, begin HBOT per protocol: 2.5 ATA for 90 Minutes with 2 Five (5) Minute Air Breaks Total Number of Treatments: - 40 One treatments per day (delivered Monday through Friday unless otherwise specified in Special Instructions below): Antihistamine 30 minutes prior to HBO Treatment, difficulty clearing ears. Finger stick Blood Glucose Pre- and Post- HBOT Treatment. Follow Hyperbaric Oxygen Glycemia Protocol GLYCEMIA INTERVENTIONS PROTOCOL PRE-HBO GLYCEMIA INTERVENTIONS ACTION INTERVENTION Obtain pre-HBO capillary blood 1 glucose (ensure physician order is in chart). A. Notify HBO physician and await physician orders. 2 If result is 70 mg/dl or below: B. If the result meets the hospital definition of a critical result, follow hospital policy. A. Give patient an 8 ounce Glucerna Shake, an 8 ounce Ensure, or 8 ounces of a Glucerna/Ensure equivalent dietary supplement*. B. Wait 30 minutes. C.  Retest patients capillary blood If result is 71 mg/dl to 130 mg/dl: glucose (CBG). D. If result greater than or equal to 110 mg/dl, proceed with HBO. If result less than 110 mg/dl, notify HBO physician and consider holding  HBO. If result is 131 mg/dl to 249 mg/dl: A. Proceed with HBO. A. Notify HBO physician and await physician orders. B. It is recommended to hold HBO If result is 250 mg/dl or greater: and do blood/urine ketone testing. C. If the result meets the hospital definition of a critical result, follow hospital policy. POST-HBO GLYCEMIA INTERVENTIONS ACTION INTERVENTION Obtain post HBO capillary blood 1 glucose (ensure physician order is in chart). A. Notify HBO physician and await physician orders. 2 If result is 70 mg/dl or below: B. If the result meets the hospital definition of a critical result, follow hospital policy. A. Give patient an 8 ounce Glucerna Shake, an 8 ounce Ensure, or 8 ounces of a Glucerna/Ensure equivalent dietary supplement*. B. Wait 15 minutes for symptoms of hypoglycemia (i.e. nervousness, anxiety, sweating, chills, If result is 71 mg/dl to 100 mg/dl: clamminess, irritability, confusion, tachycardia or dizziness). C. If patient asymptomatic, discharge patient. If patient symptomatic, repeat capillary blood glucose (CBG) and notify HBO physician. If result is 101 mg/dl to 249 mg/dl: A. Discharge patient. A. Notify HBO physician and await physician orders. B. It is recommended to do If result is 250 mg/dl or greater: blood/urine ketone testing. C. If the result meets the hospital definition of a critical result, follow hospital policy. *Juice or candies are NOT equivalent products. If patient refuses the Glucerna or Ensure, please consult the hospital dietitian for an appropriate substitute. Electronic Signature(s) Signed: 10/09/2019 5:57:08 PM By: Kela Millin Signed: 10/09/2019 6:20:19 PM By: [name] MD Entered By:  Kela Millin on 10/09/2019 15:45:06 -------------------------------------------------------------------------------- Problem List Details Patient Name: Date of Service: [name] 10/09/2019 2:15 PM Medical Record Number:[id number] Patient Account Number: [id number] Date of Birth/Sex: Treating RN: 10-Dec-1940 (79 y.o. [name] Primary Care Provider: [name] Other Clinician: Referring Provider: Treating Provider/[name]:Evelyna Folker, [name], [name] Weeks in Treatment: 12 Active Problems ICD-10 Evaluated Encounter Code Description Active Date Today Diagnosis E11.621 Type 2 diabetes mellitus with foot ulcer 07/13/2019 No Yes E11.52 Type 2 diabetes mellitus with diabetic peripheral angiopathy with 07/13/2019 No Yes gangrene L97.518 Non-pressure chronic ulcer of other part of right foot with other 07/13/2019 No Yes specified severity L97.528 Non-pressure chronic ulcer of other part of left foot with other specified 07/13/2019 No Yes severity E11.42 Type 2 diabetes mellitus with diabetic polyneuropathy 07/13/2019 No Yes 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 F) Primary Care Provider: [name] Other Clinician: Referring Provider: Treating Provider/[name]:Deanette Tullius, [name], [name] Weeks in Treatment: 12 Subjective History of Present Illness (HPI) ADMISSION 07/13/2019 Patient is a 79 year old type II diabetic. She has known PAD. She has  been followed by Dr. Jacqualyn Posey of podiatry for blistering areas on her toes dating back to 04/30/2019 which at this was the left fourth toe. By 8/11 she had wounds on the right and left second toes. She underwent arterial studies by Dr. Alvester Chou on 7/31 that showed ABIs on the right of 0.60 TBI of 0.26 on the left ABI of 0.56 and a TBI of 0.25. By 9/10 she had ischemic-looking wounds per Dr. Earleen Newport on the right first, left first second and third. She has been using Medihoney and then mupirocin more recently simply  Betadine. The patient underwent angiography by Dr. Gwenlyn Found on 9/21. 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 Noted that there was 1 vessel runoff in the foot via the peroneal. ooOn the left there was an 80% left SFA, 70% mid left SFA. There was a short segment calcified CTO distal less than SFA and above-knee popliteal artery reconstituting with two-vessel runoff. 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 It was felt that she had bilateral SFA disease as well as tibial vessel disease. An attempt was made to revascularize the left SFA but they were unable to cross because of the highly calcified nature of the lesion. 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 She has an area on the medial left fourth toe with raised horned callus on top of this. I am not certain what this represents. With regards to pain she has about a 2-1/2 I will claudication tolerance in the grocery store. She has some pain in night which is relieved by putting her feet down over the side of the bed. She is wearing Nitro-Dur patches on the top of her right foot. Past medical history includes type 2 diabetes with secondary PAD, neoplasm of the skin, diabetic retinopathy, carotid artery  stenosis, hyperlipidemia hypertension. 11/2; the last time the patient was here I spoke to Dr. Gwenlyn Found about revascularization options on the right. As I understand things currently Dr. Gwenlyn Found spoke with Dr. Trula Slade and ultimately the patient was taken to surgery on 10/27. She had a right iliofemoral endarterectomy with a bovine patch angioplasty. I think the plan now is for her to have a staged intervention on the right SFA by Dr. Alvester Chou. Per the patient's understanding Dr. Gwenlyn Found and Dr. Jacqualyn Posey are waiting to see when the gangrenous toes on the right foot can be amputated. The patient states her pain is a lot better and she is grateful for that. 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 Small area on the left fourth toe. She is using Betadine to the mummified areas on the right and Medihoney on the left 08/27/2019; the last time I saw this patient I discharged her from the clinic. She had been revascularized by Dr. Trula Slade and she had a right iliofemoral endarterectomy with a bovine angioplasty. 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 Jacqualyn Posey of podiatry on 08/07/2019. I note that she also had a intervention by Dr. Gwenlyn Found and he performed a directional atherectomy and drug- coated balloon angioplasty of the SFA and popliteal artery on the right. I am not certain of the date of Dr. Kennon Holter procedure as of the time of this dictation. She was referred back to [place] by Dr. Earleen Newport predominantly for follow-up of the left fourth toe. She still has sutures and stitches in the right TMA site. She states her pain is a lot better. She expresses concern about the condition of the amputation site at the TMA. She is on doxycycline I think prescribed by Dr. Earleen Newport. She is complaining of some pain at night 12/10; I spoke to Dr. Jacqualyn Posey last week. He removed the sutures on the right foot on Monday of this week. She has the area on the left  fourth toe  just proximal to the PIP and then the right TMA site. She is still on doxycycline and has enough through next week. Unfortunately the TMA site does not look good at all. Both on the lateral and medial part of the incisions are areas that probe to bone. There is purulence over the medial part which I have cultured. We will use silver alginate. Left fourth toe looks somewhat better but there was still exposed bone 12/17; patient has an MRI booked for 12/30. Culture I did last week showed Pseudomonas Serratia and Enterococcus. This was purulent drainage coming out of the medial part of her amputation site. I use cefdinir 300 twice daily for 10 days that started on 12/15. Her x-ray on the right showed limited evaluation for osteomyelitis. The findings could have been postoperative. There was subtle erosion in the distal first and distal fifth metatarsal. An MRI was suggested. On the left she had irregularity of the fourth middle and proximal phalanx consistent with a history of osteomyelitis. I do not know that she has a history of osteomyelitis in this area. She had a newly defined area on the plantar third toe 12/31; patient's MRI is listed below: ************************************************************************************************* MRI OF THE RIGHT FOREFOOT WITHOUT AND WITH CONTRAST TECHNIQUE: Multiplanar, multisequence MR imaging of the right forefoot was performed before and after the administration of intravenous contrast. CONTRAST: 6 mL GADAVIST IV SOLN COMPARISON: Plain films right foot 09/04/2019. FINDINGS: Bones/Joint/Cartilage The patient is status post transmetatarsal amputation as seen on the prior exam. Marrow edema and enhancement are seen in all of the distal metatarsals. In the first metatarsal, signal change extends 1.5 cm proximal to the stump and in the second metatarsal extends approximately 2 cm proximal to the stump. Edema and enhancement are seen in  only the distal 0.7 cm of the third metatarsal stump and tips of the fourth and fifth metatarsals. Bone marrow signal is otherwise unremarkable. A small focus of subchondral edema is seen in the lateral talus, likely degenerative. Ligaments Intact. Muscles and Tendons No intramuscular fluid collection. Soft tissues Skin ulceration is seen off the stump of the first metatarsal. A thin fluid collection tracks deep to the wound and over the anterior metatarsals worrisome for small abscess. Intense subcutaneous edema and enhancement are seen diffusely. 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 Cellulitis about the foot. Skin ulceration over the distal first metatarsal is identified with a thin fluid collection tracking anteriorly along the stump worrisome for abscess. Small focus of subchondral edema in the lateral talus is likely degenerative. Electronically Signed By: Inge Rise M.D. On: 09/23/2019 15:25 ************************************************************************************************* Patient arrives in clinic today not complaining any of any pain. 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 She does not describe claudication however her activity is very limited. 10/01/2019; since the patient was last here I have communicated with Dr. Gwenlyn Found after bypass by Dr. Trula Slade and addressing the right superficial femoral artery he states that she is widely patent through peroneal arteries to the ankle with collaterals to the dorsalis pedis. He states he is going to talk to colleagues about the feasibility of tibial pedal access. The patient seems infectious disease later this afternoon Dr. Baxter Flattery. in preparation for this she has been off antibiotics for 1 week  and I went ahead and obtain pieces of the remanence of her first  metatarsal for pathology and CandS. The patient is a candidate for hyperbaric oxygen. 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 1/15; considerable improvement in the wounds on her feet. We are using silver collagen. She follows with Dr. Baxter Flattery of infectious disease and is on meropenem and daptomycin. She has been taught how to do this herself at home. Follows with Dr. Graylon Good at the end of treatment here. She has 2 wounds on the surgical TMA site 1 lateral and 1 medial the lateral 1 has regressed tremendously. The area medially still has some exposed bone although the base of this looks healthy. Objective Constitutional Sitting or standing Blood Pressure is within target range for patient.. Pulse regular and within target range for patient.Marland Kitchen Respirations regular, non- labored and within target range.. Temperature is normal and within the target range for the patient.Marland Kitchen Appears in no distress. Vitals Time Taken: 2:57 PM, Height: 64 in, Weight: 142 lbs, BMI: 24.4, Temperature: 98.6 F, Pulse: 78 bpm, Respiratory Rate: 18 breaths/min, Blood Pressure: 113/67 mmHg. General Notes: Wound exam; right TMA site medially still exposed bone but the tissue generally looks a lot healthier. Using a #3 curette I removed fibrinous surface debris from the wound there is bleeding controlled with direct pressure. The area on the TMA site laterally is now much smaller. ooShe has a small area on the fourth toe which is a lot better and the tip of the left third toe also is better in fact looks almost completely epithelialized Integumentary (Hair, Skin) Wound #1 status is Open. Original cause of wound was Blister. The wound is located on the Left,Dorsal Toe Fourth. The wound measures 0.2cm length x 0.6cm width x 0.2cm depth; 0.094cm^2 area and 0.019cm^3 volume. There is bone and Fat Layer (Subcutaneous  Tissue) Exposed exposed. There is no tunneling or undermining noted. There is a small amount of serosanguineous drainage noted. The wound margin is distinct with the outline attached to the wound base. There is medium (34-66%) pink granulation within the wound bed. There is a medium (34-66%) amount of necrotic tissue within the wound bed including Adherent Slough. Wound #7 status is Open. Original cause of wound was Surgical Injury. 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 The wound measures 1cm length x 7.5cm width x 0.5cm depth; 5.89cm^2 area and 2.945cm^3 volume. There is bone and Fat Layer (Subcutaneous Tissue) Exposed exposed. There is no tunneling or undermining noted. There is a medium amount of purulent drainage noted. The wound margin is well defined and not attached to the wound base. There is small (1-33%) pale granulation within the wound bed. There is a medium (34-66%) amount of necrotic tissue within the wound bed including Eschar and Adherent Slough. Wound #8 status is Open. Original cause of wound was Gradually Appeared. The wound is located on the Left Toe Third. The wound measures 0.3cm length x 0.3cm width x 0.1cm depth; 0.071cm^2 area and 0.007cm^3 volume. There is no tunneling or undermining noted. There is a none present amount of drainage noted. The wound margin is distinct with the outline attached to the wound base. There is no granulation within the wound bed. There is a medium (34-66%) amount of necrotic tissue within the wound bed including Eschar. 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 There was a Excisional Skin/Subcutaneous Tissue Debridement with a total area of 7.5 sq cm performed by Ricard Dillon., MD. With the following instrument(s): Curette to remove Viable and Non-Viable tissue/material. Material removed includes Subcutaneous Tissue and Slough and after achieving pain control using Other (benzocaine 20% spray). No specimens were taken. A time out was conducted at 15:40, prior to the start of the procedure. A Minimum amount of bleeding was controlled with Pressure. The procedure was tolerated well with a pain level of 0 throughout and a pain level of 0 following the procedure. Post Debridement Measurements: 1cm length x 7.5cm width x 0.5cm depth; 2.945cm^3 volume. Character of Wound/Ulcer Post Debridement is improved. Severity of Tissue Post Debridement is: Fat layer exposed. Post procedure Diagnosis Wound #7: Same as Pre-Procedure Plan Follow-up Appointments: Return Appointment in 1 week. Other: - To start HBO next week Dressing Change Frequency: Change Dressing every other day. - home health to change twice a week. wound center weekly. Wound Cleansing: Clean wound with Wound Cleanser Primary Wound Dressing: Wound #1 Left,Dorsal Toe Fourth: Silver Collagen - moisten with hydrogel. Wound #7 Right Amputation Site - Transmetatarsal: Silver Collagen - moisten with hydrogel. Wound #8 Left Toe Third: Foam - or gauze to pad for protection. Secondary Dressing: Wound #1 Left,Dorsal Toe Fourth: Kerlix/Rolled Gauze Wound #7 Right Amputation Site - Transmetatarsal: Kerlix/Rolled Gauze ABD pad Wound #8 Left Toe Third: Kerlix/Rolled Gauze Dry Gauze Edema Control: Avoid standing for long periods of time Elevate  legs to the level of the heart or above for 30 minutes daily and/or when sitting, a frequency of: - throughout the day. Off-Loading: DH Walker Boot to: - right foot Home Health: [facility] for wound care. - [facility] twice a week. Hyperbaric Oxygen Therapy: Evaluate for HBO Therapy Indication: - wagner grade diabetic foot ulcer right foot If appropriate for treatment, begin HBOT per protocol: 2.5 ATA for 90 Minutes with 2 Five (5) Minute Air Breaks Total Number of Treatments: - 40 One treatments per day (delivered Monday through Friday unless otherwise specified in Special Instructions below): Antihistamine 30 minutes prior to HBO Treatment, difficulty clearing ears. Finger stick Blood Glucose Pre- and Post- HBOT Treatment. Follow Hyperbaric Oxygen Glycemia Protocol 1 continue with silver collagen 2. The patient is approved for hyperbaric oxygen she will start Monday 3. She may require bone debridement in the medial part of the right foot however I was not convinced of that today 4. I am going to put Dermagraft through her insurance. Again I am not certain this will be necessary however I want to have it in reserve in case the wound stalls Electronic Signature(s) Signed: 10/15/2019 5:54:58 PM By: [name] MD Signed: 11/19/2019 2:24:29 PM By: [name] RN, BSN Previous Signature: 10/09/2019 6:20:19 PM Version By: [name] MD Entered By: [name] on 10/14/2019 17:53:14 -------------------------------------------------------------------------------- SuperBill Details Patient Name: Date of Service: [name] 10/09/2019 Medical Record Number:[id number] Patient Account Number: [id number] Date of Birth/Sex: Treating RN: 05-27-1941 (79 y.o. [name] Primary Care Provider: [name] Other Clinician: Referring Provider: Treating Provider/[name]:Othelia Riederer, [name], [name] Weeks in Treatment:  12 Diagnosis Coding ICD-10  Codes Code Description E11.621 Type 2 diabetes mellitus with foot ulcer E11.52 Type 2 diabetes mellitus with diabetic peripheral angiopathy with gangrene L97.518 Non-pressure chronic ulcer of other part of right foot with other specified severity L97.528 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ity E11.42 Type 2 diabetes mellitus with diabetic polyneuropathy 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, right ankle and foot Facility Procedures The patient participates with Medicare or their insurance follows the Medicare Facility Guidelines: CPT4 Code Description Modifier Quantity 92330076 11042 - DEB SUBQ TISSUE 20 SQ CM/< 1 ICD-10 Diagnosis Description L97.518 Non-pressure chronic ulcer of  other part of right foot with other specified severity Physician Procedures Electronic Signature(s) Signed: 10/09/2019 6:20:19 PM By: [name] MD Entered By: [name] on 10/09/2019 15:53:22

## 2019-10-12 ENCOUNTER — Other Ambulatory Visit (INDEPENDENT_AMBULATORY_CARE_PROVIDER_SITE_OTHER): Payer: Medicare Other

## 2019-10-12 ENCOUNTER — Other Ambulatory Visit: Payer: Self-pay

## 2019-10-12 ENCOUNTER — Encounter (HOSPITAL_BASED_OUTPATIENT_CLINIC_OR_DEPARTMENT_OTHER): Payer: Medicare Other | Admitting: Internal Medicine

## 2019-10-12 DIAGNOSIS — Z4781 Encounter for orthopedic aftercare following surgical amputation: Secondary | ICD-10-CM | POA: Diagnosis not present

## 2019-10-12 DIAGNOSIS — E11319 Type 2 diabetes mellitus with unspecified diabetic retinopathy without macular edema: Secondary | ICD-10-CM | POA: Diagnosis not present

## 2019-10-12 DIAGNOSIS — E1165 Type 2 diabetes mellitus with hyperglycemia: Secondary | ICD-10-CM

## 2019-10-12 DIAGNOSIS — L97522 Non-pressure chronic ulcer of other part of left foot with fat layer exposed: Secondary | ICD-10-CM | POA: Diagnosis not present

## 2019-10-12 DIAGNOSIS — L97514 Non-pressure chronic ulcer of other part of right foot with necrosis of bone: Secondary | ICD-10-CM | POA: Diagnosis not present

## 2019-10-12 DIAGNOSIS — E785 Hyperlipidemia, unspecified: Secondary | ICD-10-CM | POA: Diagnosis not present

## 2019-10-12 DIAGNOSIS — E11621 Type 2 diabetes mellitus with foot ulcer: Secondary | ICD-10-CM | POA: Diagnosis not present

## 2019-10-12 DIAGNOSIS — M869 Osteomyelitis, unspecified: Secondary | ICD-10-CM | POA: Diagnosis not present

## 2019-10-12 DIAGNOSIS — E1152 Type 2 diabetes mellitus with diabetic peripheral angiopathy with gangrene: Secondary | ICD-10-CM | POA: Diagnosis not present

## 2019-10-12 DIAGNOSIS — I10 Essential (primary) hypertension: Secondary | ICD-10-CM | POA: Diagnosis not present

## 2019-10-12 DIAGNOSIS — L97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 limited to breakdown of skin: Secondary | ICD-10-CM | POA: Diagnosis not present

## 2019-10-12 DIAGNOSIS — L97512 Non-pressure chronic ulcer of other part of right foot with fat layer exposed: Secondary | ICD-10-CM | POA: Diagnosis not present

## 2019-10-12 DIAGNOSIS — D649 Anemia, unspecified: Secondary | ICD-10-CM | POA: Diagnosis not present

## 2019-10-12 DIAGNOSIS — K219 Gastro-esophageal reflux disease without esophagitis: Secondary | ICD-10-CM | POA: Diagnosis not present

## 2019-10-12 LAB — BASIC METABOLIC PANEL
BUN: 28 mg/dL — ABNORMAL HIGH (ref 6–23)
CO2: 29 mEq/L (ref 19–32)
Calcium: 10.4 mg/dL (ref 8.4–10.5)
Chloride: 101 mEq/L (ref 96–112)
Creatinine, Ser: 0.71 mg/dL (ref 0.40–1.20)
GFR: 79.7 mL/min (ref >60.00)
Glucose, Bld: 128 mg/dL — ABNORMAL HIGH (ref 70–99)
Potassium: 4.2 mEq/L (ref 3.5–5.1)
Sodium: 140 mEq/L (ref 135–145)

## 2019-10-12 LAB — HEMOGLOBIN A1C: Hgb A1c MFr Bld: 6 % (ref 4.6–6.5)

## 2019-10-12 LAB — GLUCOSE, CAPILLARY
Glucose-Capillary: 104 mg/dL — ABNORMAL HIGH (ref 70–99)
Glucose-Capillary: 90 mg/dL (ref 70–99)
Glucose-Capillary: 115 mg/dL — ABNORMAL HIGH (ref 70–99)
Glucose-Capillary: 110 mg/dL — ABNORMAL HIGH (ref 70–99)

## 2019-10-12 NOTE — Progress Notes (Addendum)
ASUZENA, WEIS ([id number]) Visit Report for 10/12/2019 HBO Details Patient Name: Date of Service: [name], [name] 10/12/2019 2:00 PM Medical Record Number:[id number] Patient Account Number: [id number] Date of Birth/Sex: Treating RN: 08/29/1941 (79 y.o. F) Primary Care Macenzie Burford: [name] Other Clinician: [name] Referring Wiley Magan: Treating Ersa Delaney/[name]:[name], [name], [name] Weeks in Treatment: 13 HBO Treatment Course Details Treatment Course Number: 1 Ordering Kaiea Esselman: [name] Total Treatments Ordered: 40 HBO Treatment Start Date: 10/12/2019 HBO Indication: Diabetic Ulcer(s) of the Lower Extremity HBO Treatment Details Treatment Number: 1 Patient Type: Outpatient Chamber Type: Monoplace Chamber Serial #: [id number] Treatment Protocol: 2.5 ATA with 90 minutes oxygen, with two 5 minute air breaks Treatment Details Compression Rate Down: 1.0 psi / minute De-Compression Rate Up: 2.0 psi / minute Air breaks and CompressTx Pressure breathing periods DecompressDecompress Begins Reached (leave unused spaces Begins Ends blank) Chamber Pressure (ATA)1 2.5 2.5 2.5 2.5 2.5 --2.5 1 Clock Time (24 hr) 14:46 15:01 64:6803:2122:4825:00--37:04 16:53 Treatment Length: 127 (minutes) Treatment Segments: 4 Vital Signs Capillary Blood Glucose Reference Range: 80 - 120 mg / dl HBO Diabetic Blood Glucose Intervention Range: <131 mg/dl or >249 mg/dl Time Vitals Blood Respiratory Capillary Blood Glucose Pulse Action Type: Pulse: Temperature: Taken: Pressure: Rate: Glucose (mg/dl): Meter #: Oximetry (%) Taken: Pre 13:55 135/57 86 16 97.5 120 Post 16:55 150/73 92 18 98.1 110 Treatment Response Treatment Toleration: Well Treatment Completion Treatment Completed without Adverse Event Status: Additional Procedure Documentation Tissue Sevierity: Necrosis of bone Santiago Graf Notes No concerns with treatment given. This was the patient's first HBO treatment.  Tympanic membrane exam was normal. Cardiorespiratory exams were normal. Physician HBO Attestation: I certify that I supervised this HBO treatment in accordance with Medicare guidelines. A trained Yes emergency response team is readily available per hospital policies and procedures. Continue HBOT as ordered. Yes Electronic Signature(s) Signed: 10/13/2019 4:08:35 PM By: [name] MD Entered By: [name] on 10/13/2019 16:07:13 -------------------------------------------------------------------------------- HBO Safety Checklist Details Patient Name: Date of Service: [name] 10/12/2019 2:00 PM Medical Record Number:[id number] Patient Account Number: [id number] Date of Birth/Sex: Treating RN: 1941-08-28 (79 y.o. F) Primary Care Klark Vanderhoef: [name] Other Clinician: [name] Referring Cleo Santucci: Treating Misa Fedorko/[name]:[name], [name], [name] Weeks in Treatment: 13 HBO Safety Checklist Items Safety Checklist Consent Form Signed Patient voided / foley secured and emptied When did you last eato 1330 - [place] stew Last dose of injectable or oral agent metformin NA Ostomy pouch emptied and vented if applicable All implantable devices assessed, documented and approved Intravenous access site secured and place PICC line left arm Valuables secured Linens and cotton and cotton/polyester blend (less than 51% polyester) Personal oil-based products / skin lotions / body lotions removed Wigs or hairpieces removed NA Smoking or tobacco materials removed Books / newspapers / magazines / loose paper removed Cologne, aftershave, perfume and deodorant removed Jewelry removed (may wrap wedding band) Make-up removed Hair care products removed NA Battery operated devices (external) removed NA Heating patches and chemical warmers removed NA Titanium eyewear removed NA Nail polish cured greater than 10 hours NA Casting material cured greater than 10  hours NA Hearing aids removed NA Loose dentures or partials removed NA Prosthetics have been removed Patient demonstrates correct use of air break device (if applicable) Patient concerns have been addressed Patient grounding bracelet on and cord attached to chamber Specifics for Inpatients (complete in addition to above) Medication sheet sent with patient Intravenous medications needed or due during therapy sent with patient Drainage tubes (e.g. nasogastric tube  or chest tube secured and vented) Endotracheal or Tracheotomy tube secured Cuff deflated of air and inflated with saline Airway suctioned Electronic Signature(s) Signed: 10/12/2019 2:57:56 PM By: [name] EMT/HBOT Entered By: [name] on 10/12/2019 14:57:56

## 2019-10-13 ENCOUNTER — Encounter (HOSPITAL_BASED_OUTPATIENT_CLINIC_OR_DEPARTMENT_OTHER): Payer: Medicare Other | Attending: Internal Medicine | Admitting: Internal Medicine

## 2019-10-13 DIAGNOSIS — L97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 limited to breakdown of skin: Secondary | ICD-10-CM | POA: Diagnosis not present

## 2019-10-13 DIAGNOSIS — L97514 Non-pressure chronic ulcer of other part of right foot with necrosis of bone: Secondary | ICD-10-CM | POA: Diagnosis not present

## 2019-10-13 DIAGNOSIS — E11621 Type 2 diabetes mellitus with foot ulcer: Secondary | ICD-10-CM | POA: Diagnosis not present

## 2019-10-13 DIAGNOSIS — L97522 Non-pressure chronic ulcer of other part of left foot with fat layer exposed: Secondary | ICD-10-CM | POA: Diagnosis not present

## 2019-10-13 DIAGNOSIS — E11319 Type 2 diabetes mellitus with unspecified diabetic retinopathy without macular edema: Secondary | ICD-10-CM | POA: Diagnosis not present

## 2019-10-13 DIAGNOSIS — E1152 Type 2 diabetes mellitus with diabetic peripheral angiopathy with gangrene: Secondary | ICD-10-CM | POA: Diagnosis not present

## 2019-10-13 DIAGNOSIS — L97512 Non-pressure chronic ulcer of other part of right foot with fat layer exposed: Secondary | ICD-10-CM | POA: Diagnosis not present

## 2019-10-13 LAB — GLUCOSE, CAPILLARY
Glucose-Capillary: 112 mg/dL — ABNORMAL HIGH (ref 70–99)
Glucose-Capillary: 136 mg/dL — ABNORMAL HIGH (ref 70–99)

## 2019-10-13 NOTE — Progress Notes (Signed)
JEYDI, KLINGEL ([id number]) Visit Report for 10/12/2019 SuperBill Details Patient Name: Date of Service: [name], [name] 10/12/2019 Medical Record Number:[id number] Patient Account Number: [id number] Date of Birth/Sex: Treating RN: 02/18/1941 (79 y.o. F) Primary Care Provider: [name] Other Clinician: [name] Referring Provider: Treating Provider/[name]:Manning Luna, [name], [name] Weeks in Treatment: 13 Diagnosis Coding ICD-10 Codes Code Description E11.621 Type 2 diabetes mellitus with foot ulcer E11.52 Type 2 diabetes mellitus with diabetic peripheral angiopathy with gangrene L97.518 Non-pressure chronic ulcer of other part of right foot with other specified severity L97.528 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ity E11.42 Type 2 diabetes mellitus with diabetic polyneuropathy 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, right ankle and foot Facility Procedures The patient participates with Medicare or their insurance follows the Medicare Facility Guidelines CPT4 Code Description Modifier Quantity [id number] G0277-(Facility Use Only) HBOT, full body chamber, 60min 4 Physician Procedures CPT4 Code Description Modifier Quantity 8563149 70263 - WC PHYS HYPERBARIC OXYGEN THERAPY 1 ICD-10 Diagnosis Description E11.621 Type 2 diabetes mellitus with foot ulcer Electronic Signature(s) Signed: 10/13/2019 4:08:35 PM By: [name] MD Signed: 10/13/2019 4:37:08 PM By: [name] EMT/HBOT Entered By: [name] on 10/13/2019 08:42:36

## 2019-10-13 NOTE — Progress Notes (Signed)
[name], [name] ([id number]) Visit Report for 10/13/2019 Arrival Information Details Patient Name: Date of Service: [name], [name] 10/13/2019 2:00 PM Medical Record Number:[id number] Patient Account Number: [id number] Date of Birth/Sex: Treating RN: 1941/03/10 (79 y.o. F) Primary Care Justo Hengel: [name] Other Clinician: [name] Referring Kerra Guilfoil: Treating Fitzhugh Vizcarrondo/[name]:[name], [name], [name] Weeks in Treatment: 13 Visit Information History Since Last Visit Added or deleted any medications: No Patient Arrived: Walker Any new allergies or adverse reactions: No Arrival Time: 13:45 Had a fall or experienced change in No Accompanied By: self activities of daily living that may affect Transfer Assistance: None risk of falls: Patient Identification Verified: Yes Signs or symptoms of abuse/neglect since last No Secondary Verification Process Yes visito Completed: Hospitalized since last visit: No Patient Requires Transmission- No Implantable device outside of the clinic excluding No Based Precautions: cellular tissue based products placed in the center Patient Has Alerts: Yes since last visit: Patient Alerts: Right ABI .62 TBI Pain Present Now: No refused Left ABI .60 TBI .14 Electronic Signature(s) Signed: 10/13/2019 4:37:08 PM By: [name] EMT/HBOT Entered By: [name] on 10/13/2019 14:29:39 -------------------------------------------------------------------------------- Encounter Discharge Information Details Patient Name: Date of Service: [name] 10/13/2019 2:00 PM Medical Record Number:[id number] Patient Account Number: [id number] Date of Birth/Sex: Treating RN: 07-24-1941 (79 y.o. F) Primary Care Gil Ingwersen: [name] Other Clinician: [name] Referring Juandiego Kolenovic: Treating Gordan Grell/[name]:[name], [name], [name] Weeks in Treatment: 2 Encounter Discharge Information Items Discharge  Condition: Stable Ambulatory Status: Walker Discharge Destination: Home Transportation: Private Auto Accompanied By: self Schedule Follow-up Appointment: Yes Clinical Summary of Care: Patient Declined Electronic Signature(s) Signed: 10/13/2019 4:37:08 PM By: [name] EMT/HBOT Entered By: [name] on 10/13/2019 16:36:34 -------------------------------------------------------------------------------- Patient/Caregiver Education Details Patient Name: Date of Service: [name] 1/19/2021andnbsp2:00 PM Medical Record Patient Account Number: [id number] Number: Treating RN: Date of Birth/Gender: [birth year] (79 y.o. F) Other Clinician: [name] Primary Care Physician: Verna Czech Referring Physician: Physician/[name]: Darl Pikes in Treatment: 13 Education Assessment Education Provided To: Patient Education Topics Provided Hyperbaric Oxygenation: Methods: Explain/Verbal Responses: State content correctly Electronic Signature(s) Signed: 10/13/2019 4:37:08 PM By: [name] EMT/HBOT Entered By: [name] on 10/13/2019 16:36:21 -------------------------------------------------------------------------------- Vitals Details Patient Name: Date of Service: [name] 10/13/2019 2:00 PM Medical Record Number:[id number] Patient Account Number: [id number] Date of Birth/Sex: Treating RN: 1941-06-15 (79 y.o. F) Primary Care Niamya Vittitow: [name] Other Clinician: [name] Referring Fatime Biswell: Treating Sissy Goetzke/[name]:[name], [name], [name] Weeks in Treatment: 13 Vital Signs Time Taken: 13:50 Temperature (F): 98 Height (in): 64 Pulse (bpm): 84 Weight (lbs): 142 Respiratory Rate (breaths/min): 15 Body Mass Index (BMI): 24.4 Blood Pressure (mmHg): 115/45 Capillary Blood Glucose (mg/dl): 112 Reference Range: 80 - 120 mg / dl Notes Lysa Livengood made aware of pts CBG  being under the required 130 Electronic Signature(s) Signed: 10/13/2019 4:37:08 PM By: [name] EMT/HBOT Entered By: [name] on 10/13/2019 14:30:24

## 2019-10-13 NOTE — Progress Notes (Signed)
[name], [name] ([id number]) Visit Report for 10/13/2019 SuperBill Details Patient Name: Date of Service: [name], [name] 10/13/2019 Medical Record Number:[id number] Patient Account Number: [id number] Date of Birth/Sex: Treating RN: 09-28-1940 (79 y.o. F) Primary Care Provider: [name] Other Clinician: [name] Referring Provider: Treating Provider/[name]:Karlo Goeden, [name], [name] Weeks in Treatment: 13 Diagnosis Coding ICD-10 Codes Code Description E11.621 Type 2 diabetes mellitus with foot ulcer E11.52 Type 2 diabetes mellitus with diabetic peripheral angiopathy with gangrene L97.518 Non-pressure chronic ulcer of other part of right foot with other specified severity L97.528 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ity E11.42 Type 2 diabetes mellitus with diabetic polyneuropathy 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, right ankle and foot Facility Procedures The patient participates with Medicare or their insurance follows the Medicare Facility Guidelines CPT4 Code Description Modifier Quantity [id number] G0277-(Facility Use Only) HBOT, full body chamber, 60min 4 Physician Procedures CPT4 Code Description Modifier Quantity [id number] 68257 - WC PHYS HYPERBARIC OXYGEN THERAPY 1 ICD-10 Diagnosis Description E11.621 Type 2 diabetes mellitus with foot ulcer Electronic Signature(s) Signed: 10/13/2019 4:37:08 PM By: [name] EMT/HBOT Signed: 10/13/2019 5:52:07 PM By: [name] MD Entered By: [name] on 10/13/2019 16:36:08

## 2019-10-13 NOTE — Progress Notes (Signed)
SYLIVA, MEE ([id number]) Visit Report for 10/12/2019 Arrival Information Details Patient Name: Date of Service: ARVILLA, SALADA 10/12/2019 2:00 PM Medical Record Number:[id number] Patient Account Number: [id number] Date of Birth/Sex: Treating RN: 1941/07/07 (79 y.o. F) Primary Care Johnpatrick Jenny: [name] Other Clinician: [name] Referring Zineb Glade: Treating Izreal Kock/[name]:[name], [name], [name] Weeks in Treatment: 13 Visit Information History Since Last Visit Added or deleted any medications: No Patient Arrived: Walker Any new allergies or adverse reactions: No Arrival Time: 13:50 Had a fall or experienced change in No Accompanied By: self activities of daily living that may affect Transfer Assistance: None risk of falls: Patient Identification Verified: Yes Signs or symptoms of abuse/neglect since last No Secondary Verification Process Yes visito Completed: Hospitalized since last visit: No Patient Requires Transmission- No Implantable device outside of the clinic excluding No Based Precautions: cellular tissue based products placed in the center Patient Has Alerts: Yes since last visit: Patient Alerts: Right ABI .62 TBI Pain Present Now: No refused Left ABI .60 TBI .14 Electronic Signature(s) Signed: 10/13/2019 4:37:08 PM By: [name] EMT/HBOT Entered By: [name] on 10/12/2019 14:51:24 -------------------------------------------------------------------------------- Encounter Discharge Information Details Patient Name: Date of Service: [name] 10/12/2019 2:00 PM Medical Record Number:[id number] Patient Account Number: [id number] Date of Birth/Sex: Treating RN: Jan 28, 1941 (79 y.o. F) Primary Care Merrick Feutz: [name] Other Clinician: [name] Referring Capricia Serda: Treating Carsin Randazzo/[name]:[name], [name], [name] Weeks in Treatment: 46 Encounter Discharge Information Items Discharge  Condition: Stable Ambulatory Status: Walker Discharge Destination: Home Transportation: Private Auto Accompanied By: self Schedule Follow-up Appointment: Yes Clinical Summary of Care: Patient Declined Electronic Signature(s) Signed: 10/13/2019 4:37:08 PM By: [name] EMT/HBOT Entered By: [name] on 10/13/2019 08:43:06 -------------------------------------------------------------------------------- Patient/Caregiver Education Details Patient Name: Date of Service: [name] 1/18/2021andnbsp2:00 PM Medical Record Patient Account Number: [id number] Number: Treating RN: Date of Birth/Gender: 03-Oct-1940 (78 y.o. F) Other Clinician: [name] Primary Care Physician: Verna Czech Referring Physician: Physician/[name]: Darl Pikes in Treatment: 13 Education Assessment Education Provided To: Patient Education Topics Provided Hyperbaric Oxygenation: Methods: Explain/Verbal Responses: State content correctly Electronic Signature(s) Signed: 10/13/2019 4:37:08 PM By: [name] EMT/HBOT Entered By: [name] on 10/13/2019 08:42:52 -------------------------------------------------------------------------------- Vitals Details Patient Name: Date of Service: [name] 10/12/2019 2:00 PM Medical Record Number:[id number] Patient Account Number: [id number] Date of Birth/Sex: Treating RN: 31-Mar-1941 (79 y.o. F) Primary Care Becca Bayne: [name] Other Clinician: [name] Referring Artavis Cowie: Treating Cristian Grieves/[name]:[name], [name], [name] Weeks in Treatment: 13 Vital Signs Time Taken: 13:55 Temperature (F): 97.5 Height (in): 64 Pulse (bpm): 86 Weight (lbs): 142 Respiratory Rate (breaths/min): 16 Body Mass Index (BMI): 24.4 Blood Pressure (mmHg): 135/57 Capillary Blood Glucose (mg/dl): 120 Reference Range: 80 - 120 mg / dl Notes Pt original CBG 90. Glorie Dowlen  made aware. Pt given 1 1/2 bottles of glucerna. Elona Yinger updated with new CBG Electronic Signature(s) Signed: 10/13/2019 4:37:08 PM By: [name] EMT/HBOT Entered By: [name] on 10/12/2019 14:54:32

## 2019-10-13 NOTE — Progress Notes (Addendum)
[name] ([id number]) Visit Report for 10/13/2019 HBO Details Patient Name: Date of Service: [name], [name] 10/13/2019 2:00 PM Medical Record Number:[id number] Patient Account Number: [id number] Date of Birth/Sex: Treating RN: 1941-06-01 (79 y.o. F) Primary Care Dale Strausser: [name] Other Clinician: [name] Referring Monaye Blackie: Treating Channie Bostick/[name]:[name], [name], [name] Weeks in Treatment: 13 HBO Treatment Course Details Treatment Course Number: 1 Ordering Jnaya Butrick: [name] Total Treatments Ordered: 40 HBO Treatment Start Date: 10/12/2019 HBO Indication: Diabetic Ulcer(s) of the Lower Extremity HBO Treatment Details Treatment Number: 2 Patient Type: Outpatient Chamber Type: Monoplace Chamber Serial #: [id number] Treatment Protocol: 2.5 ATA with 90 minutes oxygen, with two 5 minute air breaks Treatment Details Compression Rate Down: 2.0 psi / minute De-Compression Rate Up: 2.0 psi / minute Air breaks and CompressTx Pressure breathing periods DecompressDecompress Begins Reached (leave unused spaces Begins Ends blank) Chamber Pressure (ATA)1 2.5 2.5 2.5 2.5 2.5 --2.5 1 Clock Time (24 hr) 14:04 14:16 14:4614:5115:2115:26--15:56 16:08 Treatment Length: 124 (minutes) Treatment Segments: 4 Vital Signs Capillary Blood Glucose Reference Range: 80 - 120 mg / dl HBO Diabetic Blood Glucose Intervention Range: <131 mg/dl or >249 mg/dl Time Vitals Blood Respiratory Capillary Blood Glucose Pulse Action Type: Pulse: Temperature: Taken: Pressure: Rate: Glucose (mg/dl): Meter #: Oximetry (%) Taken: Pre 13:50 115/45 84 15 98 112 Post 16:10 125/84 82 16 98 136 Treatment Response Treatment Toleration: Well Treatment Completion Treatment Completed without Adverse Event Status: Additional Procedure Documentation Tissue Sevierity: Necrosis of bone Neville Walston Notes No concerns with treatment given Physician HBO Attestation: I certify that I  supervised this HBO treatment in accordance with Medicare guidelines. A trained Yes emergency response team is readily available per hospital policies and procedures. Continue HBOT as ordered. Yes Electronic Signature(s) Signed: 10/13/2019 5:52:07 PM By: [name] MD Previous Signature: 10/13/2019 4:37:08 PM Version By: [name] EMT/HBOT Entered By: [name] on 10/13/2019 16:40:53 -------------------------------------------------------------------------------- HBO Safety Checklist Details Patient Name: Date of Service: [name] 10/13/2019 2:00 PM Medical Record Number:[id number] Patient Account Number: [id number] Date of Birth/Sex: Treating RN: 09/20/1941 (79 y.o. F) Primary Care Lindel Marcell: [name] Other Clinician: [name] Referring Camran Keady: Treating Micah Barnier/[name]:[name], [name], [name] Weeks in Treatment: 13 HBO Safety Checklist Items Safety Checklist Consent Form Signed Patient voided / foley secured and emptied When did you last eato 1200 - PBandJ sammy/Boost Last dose of injectable or oral agent metformin NA Ostomy pouch emptied and vented if applicable All implantable devices assessed, documented and approved Intravenous access site secured and place PICC line left arm Valuables secured Linens and cotton and cotton/polyester blend (less than 51% polyester) Personal oil-based products / skin lotions / body lotions removed Wigs or hairpieces removed NA Smoking or tobacco materials removed Books / newspapers / magazines / loose paper removed Cologne, aftershave, perfume and deodorant removed Jewelry removed (may wrap wedding band) Make-up removed Hair care products removed NA Battery operated devices (external) removed NA Heating patches and chemical warmers removed NA Titanium eyewear removed NA Nail polish cured greater than 10 hours NA Casting material cured greater than 10 hours NA Hearing aids removed NA  Loose dentures or partials removed NA Prosthetics have been removed Patient demonstrates correct use of air break device (if applicable) Patient concerns have been addressed Patient grounding bracelet on and cord attached to chamber Specifics for Inpatients (complete in addition to above) Medication sheet sent with patient Intravenous medications needed or due during therapy sent with patient Drainage tubes (e.g. nasogastric tube or chest tube secured and vented)  Endotracheal or Tracheotomy tube secured Cuff deflated of air and inflated with saline Airway suctioned Electronic Signature(s) Signed: 10/13/2019 2:31:44 PM By: [name] EMT/HBOT Entered By: [name] on 10/13/2019 14:31:42

## 2019-10-14 ENCOUNTER — Encounter (HOSPITAL_BASED_OUTPATIENT_CLINIC_OR_DEPARTMENT_OTHER): Payer: Medicare Other | Admitting: Physician Assistant

## 2019-10-14 ENCOUNTER — Other Ambulatory Visit: Payer: Self-pay

## 2019-10-14 DIAGNOSIS — L97514 Non-pressure chronic ulcer of other part of right foot with necrosis of bone: Secondary | ICD-10-CM | POA: Diagnosis not present

## 2019-10-14 DIAGNOSIS — L97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 limited to breakdown of skin: Secondary | ICD-10-CM | POA: Diagnosis not present

## 2019-10-14 DIAGNOSIS — E11621 Type 2 diabetes mellitus with foot ulcer: Secondary | ICD-10-CM | POA: Diagnosis not present

## 2019-10-14 DIAGNOSIS — L97512 Non-pressure chronic ulcer of other part of right foot with fat layer exposed: Secondary | ICD-10-CM | POA: Diagnosis not present

## 2019-10-14 DIAGNOSIS — E11319 Type 2 diabetes mellitus with unspecified diabetic retinopathy without macular edema: Secondary | ICD-10-CM | POA: Diagnosis not present

## 2019-10-14 DIAGNOSIS — D649 Anemia, unspecified: Secondary | ICD-10-CM | POA: Diagnosis not present

## 2019-10-14 DIAGNOSIS — I10 Essential (primary) hypertension: Secondary | ICD-10-CM | POA: Diagnosis not present

## 2019-10-14 DIAGNOSIS — E1152 Type 2 diabetes mellitus with diabetic peripheral angiopathy with gangrene: Secondary | ICD-10-CM | POA: Diagnosis not present

## 2019-10-14 DIAGNOSIS — Z4781 Encounter for orthopedic aftercare following surgical amputation: Secondary | ICD-10-CM | POA: Diagnosis not present

## 2019-10-14 DIAGNOSIS — L97522 Non-pressure chronic ulcer of other part of left foot with fat layer exposed: Secondary | ICD-10-CM | POA: Diagnosis not present

## 2019-10-14 DIAGNOSIS — E785 Hyperlipidemia, unspecified: Secondary | ICD-10-CM | POA: Diagnosis not present

## 2019-10-14 DIAGNOSIS — K219 Gastro-esophageal reflux disease without esophagitis: Secondary | ICD-10-CM | POA: Diagnosis not present

## 2019-10-14 LAB — GLUCOSE, CAPILLARY
Glucose-Capillary: 100 mg/dL — ABNORMAL HIGH (ref 70–99)
Glucose-Capillary: 79 mg/dL (ref 70–99)

## 2019-10-14 NOTE — Progress Notes (Addendum)
[name], [name] ([id number]) Visit Report for 10/14/2019 HBO Details Patient Name: Date of Service: [name], [name] 10/14/2019 2:00 PM Medical Record Number:[id number] Patient Account Number: [id number] Date of Birth/Sex: Treating RN: [birth year] (79 y.o. F) Primary Care Aracelys Glade: [name] Other Clinician: [name] Referring Oddie Bottger: Treating Shanequa Whitenight/[name]:Stone III, Alen Bleacher, [name] Weeks in Treatment: 13 HBO Treatment Course Details Treatment Course Number: 1 Ordering Shakinah Navis: [name] Total Treatments Ordered: 40 HBO Treatment Start Date: 10/12/2019 HBO Indication: Diabetic Ulcer(s) of the Lower Extremity HBO Treatment Details Treatment Number: 3 Patient Type: Outpatient Chamber Type: Monoplace Chamber Serial #: [id number] Treatment Protocol: 2.5 ATA with 90 minutes oxygen, with two 5 minute air breaks Treatment Details Compression Rate Down: 2.0 psi / minute De-Compression Rate Up: 2.0 psi / minute Air breaks and CompressTx Pressure breathing periods DecompressDecompress Begins Reached (leave unused spaces Begins Ends blank) Chamber Pressure (ATA)1 2.5 2.5 2.5 2.5 2.5 --2.5 1 Clock Time (24 hr) 14:26 14:38 54:6270:3500:9381:82--99:37 16:30 Treatment Length: 124 (minutes) Treatment Segments: 4 Vital Signs Capillary Blood Glucose Reference Range: 80 - 120 mg / dl HBO Diabetic Blood Glucose Intervention Range: <131 mg/dl or >249 mg/dl Time Vitals Blood Respiratory Capillary Blood Glucose Pulse Action Type: Pulse: Temperature: Taken: Pressure: Rate: Glucose (mg/dl): Meter #: Oximetry (%) Taken: Pre 14:05 129/55 79 16 97.8 115 Post 16:32 132/77 86 14 98.1 134 Treatment Response Treatment Toleration: Well Treatment Completion Treatment Completed without Adverse Event Status: Additional Procedure Documentation Tissue Sevierity: Necrosis of bone Electronic Signature(s) Signed: 10/14/2019 4:44:29 PM By: [name] EMT/HBOT Signed:  10/14/2019 7:03:16 PM By: Worthy Keeler PA-C Entered By: [name] on 10/14/2019 16:42:53 -------------------------------------------------------------------------------- HBO Safety Checklist Details Patient Name: Date of Service: [name] 10/14/2019 2:00 PM Medical Record Number:[id number] Patient Account Number: [id number] Date of Birth/Sex: Treating RN: 10-31-1940 (79 y.o. F) Primary Care Mechille Varghese: [name] Other Clinician: [name] Referring Anthony Tamburo: Treating Rochell Mabie/[name]:Stone III, Alen Bleacher, [name] Weeks in Treatment: 13 HBO Safety Checklist Items Safety Checklist Consent Form Signed Patient voided / foley secured and emptied When did you last eato 1200 - soup Last dose of injectable or oral agent metformin NA Ostomy pouch emptied and vented if applicable All implantable devices assessed, documented and approved Intravenous access site secured and place PICC line left arm Valuables secured Linens and cotton and cotton/polyester blend (less than 51% polyester) Personal oil-based products / skin lotions / body lotions removed NA Wigs or hairpieces removed NA Smoking or tobacco materials removed Books / newspapers / magazines / loose paper removed Cologne, aftershave, perfume and deodorant removed Jewelry removed (may wrap wedding band) Make-up removed Hair care products removed NA Battery operated devices (external) removed NA Heating patches and chemical warmers removed NA Titanium eyewear removed Nail polish cured greater than 10 hours NA Casting material cured greater than 10 hours NA Hearing aids removed NA Loose dentures or partials removed NA Prosthetics have been removed Patient demonstrates correct use of air break device (if applicable) Patient concerns have been addressed Patient grounding bracelet on and cord attached to chamber Specifics for Inpatients (complete in addition to above) Medication sheet sent with  patient Intravenous medications needed or due during therapy sent with patient Drainage tubes (e.g. nasogastric tube or chest tube secured and vented) Endotracheal or Tracheotomy tube secured Cuff deflated of air and inflated with saline Airway suctioned Electronic Signature(s) Signed: 10/14/2019 2:30:39 PM By: [name] EMT/HBOT Entered By: [name] on 10/14/2019 14:30:38

## 2019-10-14 NOTE — Progress Notes (Signed)
[name], [name] ([id number]) Visit Report for 10/14/2019 Arrival Information Details Patient Name: Date of Service: [name], [name] 10/14/2019 2:00 PM Medical Record Number:[id number] Patient Account Number: [id number] Date of Birth/Sex: Treating RN: Nov 24, 1940 (79 y.o. F) Primary Care Jonus Coble: [name] Other Clinician: [name] Referring Dencil Cayson: Treating Breckyn Troyer/[name]:Stone III, Alen Bleacher, [name] Weeks in Treatment: 13 Visit Information History Since Last Visit Added or deleted any medications: No Patient Arrived: Walker Any new allergies or adverse reactions: No Arrival Time: 14:00 Had a fall or experienced change in No Accompanied By: self activities of daily living that may affect Transfer Assistance: None risk of falls: Patient Identification Verified: Yes Signs or symptoms of abuse/neglect since last No Secondary Verification Process Yes visito Completed: Hospitalized since last visit: No Patient Requires Transmission- No Implantable device outside of the clinic excluding No Based Precautions: cellular tissue based products placed in the center Patient Has Alerts: Yes since last visit: Patient Alerts: Right ABI .62 TBI Pain Present Now: No refused Left ABI .60 TBI .14 Electronic Signature(s) Signed: 10/14/2019 4:44:29 PM By: [name] EMT/HBOT Entered By: [name] on 10/14/2019 14:29:03 -------------------------------------------------------------------------------- Encounter Discharge Information Details Patient Name: Date of Service: [name] 10/14/2019 2:00 PM Medical Record Number:[id number] Patient Account Number: [id number] Date of Birth/Sex: Treating RN: September 14, 1941 (79 y.o. F) Primary Care Tukker Byrns: [name] Other Clinician: [name] Referring Alexxia Stankiewicz: Treating Chabeli Barsamian/[name]:Stone III, Alen Bleacher, [name] Weeks in Treatment: 64 Encounter Discharge Information Items Discharge  Condition: Stable Ambulatory Status: Walker Discharge Destination: Home Transportation: Private Auto Accompanied By: self Schedule Follow-up Appointment: Yes Clinical Summary of Care: Patient Declined Electronic Signature(s) Signed: 10/14/2019 4:44:29 PM By: [name] EMT/HBOT Entered By: [name] on 10/14/2019 16:43:27 -------------------------------------------------------------------------------- Patient/Caregiver Education Details Patient Name: Date of Service: [name] 1/20/2021andnbsp2:00 PM Medical Record Patient Account Number: [id number] Number: Treating RN: Date of Birth/Gender: [birth year] (78 y.o. F) Other Clinician: [name] Primary Care Physician: [name] Treating Worthy Keeler Referring Physician: Physician/[name]: Darl Pikes in Treatment: 13 Education Assessment Education Provided To: Patient Education Topics Provided Hyperbaric Oxygenation: Methods: Explain/Verbal Responses: State content correctly Electronic Signature(s) Signed: 10/14/2019 4:44:29 PM By: [name] EMT/HBOT Entered By: [name] on 10/14/2019 16:43:13 -------------------------------------------------------------------------------- Vitals Details Patient Name: Date of Service: [name] 10/14/2019 2:00 PM Medical Record Number:[id number] Patient Account Number: [id number] Date of Birth/Sex: Treating RN: December 25, 1940 (79 y.o. F) Primary Care Isaih Bulger: [name] Other Clinician: [name] Referring Andrewjames Weirauch: Treating Nailah Luepke/[name]:Stone III, Alen Bleacher, [name] Weeks in Treatment: 13 Vital Signs Time Taken: 14:05 Temperature (F): 97.8 Height (in): 64 Pulse (bpm): 79 Weight (lbs): 142 Respiratory Rate (breaths/min): 16 Body Mass Index (BMI): 24.4 Blood Pressure (mmHg): 129/55 Capillary Blood Glucose (mg/dl): 115 Reference Range: 80 - 120 mg / dl Notes [name] made aware of pts  CBG. Pt given 2 glucerna Electronic Signature(s) Signed: 10/14/2019 4:44:29 PM By: [name] EMT/HBOT Entered By: [name] on 10/14/2019 14:29:44

## 2019-10-14 NOTE — Progress Notes (Signed)
[name], [name] ([id number]) Visit Report for 10/14/2019 SuperBill Details Patient Name: Date of Service: [name], [name] 10/14/2019 Medical Record Number:[id number] Patient Account Number: [id number] Date of Birth/Sex: Treating RN: 19-Jun-1941 (79 y.o. 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, right ankle and foot Facility Procedures The patient participates with Medicare or their insurance follows the Medicare Facility Guidelines CPT4 Code Description Modifier Quantity [id number] G0277-(Facility Use Only) HBOT, full body chamber, 56min 4 Physician Procedures CPT4 Code Description Modifier Quantity [id number] 38466 - WC PHYS HYPERBARIC OXYGEN THERAPY 1 ICD-10 Diagnosis Description E11.621 Type 2 diabetes mellitus with foot ulcer Electronic Signature(s) Signed: 10/14/2019 4:44:29 PM By: [name] EMT/HBOT Signed: 10/14/2019 7:03:16 PM By: Worthy Keeler PA-C Entered By: [name] on 10/14/2019 16:43:00

## 2019-10-14 NOTE — Progress Notes (Signed)
[name], [name] ([id number]) Visit Report for 10/09/2019 Arrival Information Details Patient Name: Date of Service: [name], [name] 10/09/2019 2:15 PM Medical Record Number:[id number] Patient Account Number: [id number] Date of Birth/Sex: Treating RN: January 11, 1941 (79 y.o. [name] Primary Care Fitzgerald Dunne: [name] Other Clinician: Referring Justeen Hehr: Treating Deltha Bernales/[name]:[name], [name], [name] Weeks in Treatment: 12 Visit Information History Since Last Visit All ordered tests and consults were completed: No Patient Arrived: Ambulatory Added or deleted any medications: No Arrival Time: 14:57 Any new allergies or adverse reactions: No Accompanied By: self Had a fall or experienced change in No Transfer Assistance: None activities of daily living that may affect Patient Identification Verified: Yes risk of falls: Secondary Verification Process Yes Signs or symptoms of abuse/neglect since last No Completed: visito Patient Requires Transmission- No Hospitalized since last visit: No Based Precautions: Implantable device outside of the clinic excluding No Patient Has Alerts: Yes cellular tissue based products placed in the center Patient Alerts: Right ABI .62 TBI since last visit: refused Has Dressing in Place as Prescribed: Yes Left ABI .60 TBI Pain Present Now: No .14 Electronic Signature(s) Signed: 10/14/2019 5:42:11 PM By: Carlene Coria RN Entered By: Carlene Coria on 10/09/2019 14:57:26 -------------------------------------------------------------------------------- Encounter Discharge Information Details Patient Name: Date of Service: [name] 10/09/2019 2:15 PM Medical Record Number:[id number] Patient Account Number: [id number] Date of Birth/Sex: Treating RN: 04/17/1941 (79 y.o. [name] Primary Care Taylin Leder: [name] Other Clinician: Referring Carmon Sahli: Treating Mihika Surrette/[name]:[name], [name],  [name] Weeks in Treatment: 12 Encounter Discharge Information Items Post Procedure Vitals Discharge Condition: Stable Temperature (F): 98.6 Ambulatory Status: Walker Pulse (bpm): 78 Discharge Destination: Home Respiratory Rate (breaths/min): 18 Transportation: Private Auto Blood Pressure (mmHg): 113/67 Accompanied By: alone Schedule Follow-up Appointment: Yes Clinical Summary of Care: Patient Declined Electronic Signature(s) Signed: 10/14/2019 5:42:11 PM By: Carlene Coria RN Entered By: Carlene Coria on 10/09/2019 16:02:14 -------------------------------------------------------------------------------- Lower Extremity Assessment Details Patient Name: Date of Service: [name], [name] 10/09/2019 2:15 PM Medical Record Number:[id number] Patient Account Number: [id number] Date of Birth/Sex: Treating RN: May 28, 1941 (79 y.o. [name] Primary Care Emmry Hinsch: [name] Other Clinician: Referring Cyriah Childrey: Treating Keigan Tafoya/[name]:[name], [name], [name] Weeks in Treatment: 12 Edema Assessment Assessed: [Left: No] [Right: No] Edema: [Left: No] [Right: No] Calf Left: Right: Point of Measurement: 39 cm From Medial Instep 30 cm 29 cm Ankle Left: Right: Point of Measurement: 10 cm From Medial Instep 19 cm 19 cm Electronic Signature(s) Signed: 10/14/2019 5:42:11 PM By: Carlene Coria RN Entered By: Carlene Coria on 10/09/2019 15:10:11 -------------------------------------------------------------------------------- Multi Wound Chart Details Patient Name: Date of Service: [name] 10/09/2019 2:15 PM Medical Record Number:[id number] Patient Account Number: [id number] Date of Birth/Sex: Treating RN: 06/15/1941 (79 y.o. 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] [7:Lower Extremity] [8:Lower Extremity] Secondary Etiology: [1:Arterial Insufficiency Ulcer N/A] [8:N/A] Comorbid History: [1:Deep Vein Thrombosis, Hypertension, Peripheral Hypertension, Peripheral Hypertension, Peripheral Arterial Disease, Type II Diabetes] [7:Deep Vein Thrombosis, Arterial Disease, Type II Arterial Disease, Type II Diabetes] [8:Deep Vein  Thrombosis, Diabetes] Date Acquired: [1:09/16/2018] [7:08/07/2019] [8:09/10/2019] Weeks of  Treatment: [1:12] [7:6] [8:4] Wound Status: [1:Open] [7:Open] [8:Open] Measurements L x W x D 0.2x0.6x0.2 [7:1x7.5x0.5] [8:0.3x0.3x0.1] (cm) Area (cm) : [1:0.094] [7:5.89] [8:0.071] Volume (cm) : [1:0.019] [7:2.945] [8:0.007] % Reduction in Area: [1:86.70%] [7:43.20%] [8:83.90%] % Reduction in Volume: 73.20% [7:-184.00%] [8:84.10%] Classification: [1:Grade 2] [7:Grade 4] [8:Grade 2] Exudate Amount: [1:Small] [7:Medium] [8:None Present] Exudate Type: [1:Serosanguineous] [7:Purulent] [8:N/A] Exudate Color: [1:red, brown] [7:yellow, brown, green] [8:N/A] Wound Margin: [1:Distinct, outline attached Well defined, not attached Distinct, outline attached] Granulation Amount: [1:Medium (34-66%)] [7:Small (1-33%)] [8:None Present (0%)] Granulation Quality: [1:Pink] [7:Pale] [8:N/A] Necrotic Amount: [1:Medium (34-66%)] [7:Medium (34-66%)] [8:Medium (34-66%)] Necrotic Tissue: [1:Adherent Slough] [7:Eschar, Adherent Slough Eschar] Exposed Structures: [1:Fat Layer (Subcutaneous Fat Layer (Subcutaneous  Fascia: No Tissue) Exposed: Yes Bone: Yes Fascia: No Tendon: No Muscle: No Joint: No] [7:Tissue) Exposed: Yes Bone: Yes Fascia: No Tendon: No Muscle: No Joint: No] [8:Fat Layer (Subcutaneous Tissue)  Exposed: No Tendon: No Muscle: No Joint: No Bone: No] Epithelialization: [1:Small (1-33%)] [7:None] [8:None] Debridement: [1:N/A] [7:Debridement - Excisional N/A] Pre-procedure [1:N/A] [7:15:40] [8:N/A] Verification/Time Out Taken: Pain Control: [1:N/A] [7:Other] [8:N/A] Tissue Debrided: [1:N/A] [7:Subcutaneous, Slough] [8:N/A] Level: [1:N/A] [7:Skin/Subcutaneous Tissue N/A] Debridement Area (sq cm):N/A [7:7.5] [8:N/A] Instrument: [1:N/A] [7:Curette] [8:N/A] Bleeding: [1:N/A] [7:Minimum] [8:N/A] Hemostasis Achieved: [1:N/A] [7:Pressure] [8:N/A] Procedural Pain: [1:N/A] [7:0] [8:N/A] Post Procedural Pain: [1:N/A] [7:0] [8:N/A] Debridement Treatment N/A [7:Procedure was tolerated] [8:N/A] Response: [7:well] Post Debridement [1:N/A] [7:1x7.5x0.5] [8:N/A] Measurements L x W x D (cm) Post Debridement [1:N/A] [7:2.945] [8:N/A] Volume: (cm) Procedures Performed: N/A [7:Debridement] [8:N/A] Treatment Notes Electronic Signature(s) Signed: 10/09/2019 6:20:19 PM By: [name] MD Entered By: [name] on 10/09/2019 15:48:10 -------------------------------------------------------------------------------- Multi-Disciplinary Care Plan Details Patient Name: Date of Service: [name] 10/09/2019 2:15 PM Medical Record Number:[id number] Patient Account Number: [id number] Date of Birth/Sex: Treating RN: 07-Sep-1941 (79 y.o. 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 Potential for barotraumas to ears, sinuses,  teeth, and lungs or cerebral gas embolism related to changes in atmospheric pressure inside hyperbaric oxygen chamber Goals: Barotrauma will be prevented during HBO2 Date Initiated: 10/01/2019 Target Resolution Date: 10/30/2019 Goal Status: Active Patient and/or family will be able to state/discuss factors appropriate to the management of their disease process during treatment Date Initiated: 10/01/2019 Target Resolution Date: 10/31/2019 Goal Status: Active Patient/caregiver will verbalize understanding of HBO goals, rationale, procedures and potential hazards Date Initiated: 10/01/2019 Target Resolution Date: 10/30/2019 Goal Status: Active Interventions: Assess and provide for patients comfort related to the hyperbaric environment and equalization of middle ear Assess patient for any history of confinement anxiety Notes: Abuse / Safety / Falls / Self Care Management Nursing Diagnoses: Potential for falls Goals: Patient will remain injury free related to falls Date Initiated: 08/27/2019 Target Resolution Date: 10/30/2019 Goal Status: Active Interventions: Provide education on fall prevention Notes: Pain, Acute or Chronic Nursing Diagnoses: Pain, acute or chronic: actual or potential Potential alteration in comfort, pain Goals: Patient will verbalize adequate pain control and receive pain control interventions during procedures as needed Date Initiated: 08/27/2019 Target Resolution Date: 10/30/2019 Goal Status: Active Interventions: Encourage patient to take pain medications as prescribed Provide education on pain management Reposition patient for comfort Notes: Electronic Signature(s) Signed: 10/09/2019 5:57:08 PM By: Kela Millin Entered By: Kela Millin on 10/09/2019 15:28:02 -------------------------------------------------------------------------------- Pain Assessment Details Patient Name: Date of Service: [name] 10/09/2019  2:15 PM Medical Record  Number:[id number] Patient Account Number: [id number] Date of Birth/Sex: Treating RN: 1941/03/09 (79 y.o. [name] Primary Care Makylee Sanborn: [name] Other Clinician: Referring Mashell Sieben: Treating Gardner Servantes/[name]:[name], [name], [name] Weeks in Treatment: 12 Active Problems Location of Pain Severity and Description of Pain Patient Has Paino No Site Locations Pain Management and Medication Current Pain Management: Electronic Signature(s) Signed: 10/14/2019 5:42:11 PM By: Carlene Coria RN Entered By: Carlene Coria on 10/09/2019 14:58:08 -------------------------------------------------------------------------------- Patient/Caregiver Education Details Patient Name: Date of Service: [name] 1/15/2021andnbsp2:15 PM Medical Record Number:[id number] Patient Account Number: [id number] Date of Birth/Gender: Treating RN: Feb 09, 1941 (79 y.o. [name] Primary Care Physician: [name] Other Clinician: Referring Physician: Treating Physician/[name]:[name], [name], [name] in Treatment: 12 Education Assessment Education Provided To: Patient Education Topics Provided Pain: Methods: Explain/Verbal Responses: State content correctly Safety: Methods: Explain/Verbal Responses: State content correctly Electronic Signature(s) Signed: 10/09/2019 5:57:08 PM By: Kela Millin Entered By: Kela Millin on 10/09/2019 15:28:21 -------------------------------------------------------------------------------- Wound Assessment Details Patient Name: Date of Service: [name] 10/09/2019 2:15 PM Medical Record Number:[id number] Patient Account Number: [id number] Date of Birth/Sex: Treating RN: [birth year] (79 y.o. [name] Primary Care Bristol Soy: [name] Other Clinician: Referring Nicolai Labonte: Treating Wright Gravely/[name]:[name], [name], [name] Weeks in Treatment: 12 Wound Status Wound  Number: 1 Primary Diabetic Wound/Ulcer of the Lower Extremity Etiology: Wound Location: Left Toe Fourth - Dorsal Secondary Arterial Insufficiency Ulcer Wounding Event: Blister Etiology: Date Acquired: 09/16/2018 Wound Open Weeks Of Treatment: 12 Status: Clustered Wound: No Comorbid Deep Vein Thrombosis, Hypertension, History: Peripheral Arterial Disease, Type II Diabetes Photos Wound Measurements Length: (cm) 0.2 % Reduction i Width: (cm) 0.6 % Reduction i Depth: (cm) 0.2 Epithelializa Area: (cm) 0.094 Tunneling: Volume: (cm) 0.019 Undermining: Wound Description Classification: Grade 2 Foul Odor Aft Wound Margin: Distinct, outline attached Slough/Fibrin Exudate Amount: Small Exudate Type: Serosanguineous Exudate Color: red, brown Wound Bed Granulation Amount: Medium (34-66%) Granulation Quality: Pink Fascia Expose Necrotic Amount: Medium (34-66%) Fat Layer (Su Necrotic Quality: Adherent Slough Tendon Expose Muscle Expose Joint Exposed Bone Exposed: er Cleansing: No o Yes Exposed Structure d: No bcutaneous Tissue) Exposed: Yes d: No d: No : No Yes n Area: 86.7% n Volume: 73.2% tion: Small (1-33%) No No Treatment Notes Wound #1 (Left, Dorsal Toe Fourth) 1. Cleanse With Wound Cleanser Soap and water 3. Primary Dressing Applied [employer] 4. Secondary Dressing Dry Gauze Notes netting Electronic Signature(s) Signed: 10/13/2019 4:37:08 PM By: [name] EMT/HBOT Signed: 10/14/2019 5:42:11 PM By: Carlene Coria RN Entered By: [name] on 10/12/2019 16:22:37 -------------------------------------------------------------------------------- Wound Assessment Details Patient Name: Date of Service: [name] 10/09/2019 2:15 PM Medical Record Number:[id number] Patient Account Number: [id number] Date of Birth/Sex: Treating RN: [birth year] (79 y.o. 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 Wound Open Status: Wounding Event: Surgical Injury Comorbid Deep Vein Thrombosis, Hypertension, Date Acquired: 08/07/2019 History: Peripheral Arterial Disease, Type II Diabetes Weeks Of Treatment: 6 Clustered Wound: No Photos Wound Measurements Length: (cm) 1 Width: (cm) 7.5 Depth: (cm) 0.5 Area: (cm) 5.89 Volume: (cm) 2.945 Wound Description Classification: Grade 4 Wound Margin: Well defined, not attached Exudate Amount: Medium Exudate Type: Purulent Exudate Color: yellow, brown, green Wound Bed Granulation Amount: Small (1-33%) Granulation Quality: Pale Necrotic Amount: Medium (34-66%) Necrotic Quality: Eschar, Adherent Slough fter Cleansing:  No ino Yes Exposed Structure sed: No Subcutaneous Tissue) Exposed: Yes sed: No sed: No ed: No d: Yes % Reduction in Area: 43.2% % Reduction in Volume: -184% Epithelialization: None Tunneling: No Undermining: No Foul Odor A Slough/Fibr Fascia Expo Fat Layer ( Tendon Expo Muscle Expo Joint Expos Bone Expose Treatment Notes 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 Cleanse With Wound Cleanser Soap and water 3. Primary Dressing Applied [employer] 4. Secondary Dressing ABD Pad Dry Gauze Roll Gauze Notes netting Electronic Signature(s) Signed: 10/13/2019 4:37:08 PM By: [name] EMT/HBOT Signed: 10/14/2019 5:42:11 PM By: Carlene Coria RN Entered By: [name] on 10/12/2019 16:23:22 -------------------------------------------------------------------------------- Wound Assessment Details Patient Name: Date of Service: [name] 10/09/2019 2:15 PM Medical Record Number:[id number] Patient Account Number: [id number] Date of Birth/Sex: Treating  RN: 07/24/1941 (79 y.o. [name] Primary Care Nakira Litzau: [name] Other Clinician: Referring Clariza Sickman: Treating Helma Argyle/[name]:[name], [name], [name] Weeks in Treatment: 12 Wound Status Wound Number: 8 Primary Diabetic Wound/Ulcer of the Lower Extremity Etiology: Wound Location: Left Toe Third Wound Open Wounding Event: Gradually Appeared Status: Date Acquired: 09/10/2019 Comorbid Deep Vein Thrombosis, Hypertension, Weeks Of Treatment: 4 History: Peripheral Arterial Disease, Type II Diabetes Clustered Wound: No Photos Wound Measurements Length: (cm) 0.3 Width: (cm) 0.3 Depth: (cm) 0.1 Area: (cm) 0.071 Volume: (cm) 0.007 Wound Description Classification: Grade 2 Wound Margin: Distinct, outline attached Exudate Amount: None Present Wound Bed Granulation Amount: None Present (0%) Necrotic Amount: Medium (34-66%) Necrotic Quality: Eschar ter Cleansing: No no No Exposed Structure ed: No ubcutaneous Tissue) Exposed: No ed: No ed: No d: No : No % Reduction in Area: 83.9% % Reduction in Volume: 84.1% Epithelialization: None Tunneling: No Undermining: No Foul Odor Af Slough/Fibri Fascia Expos Fat Layer (S Tendon Expos Muscle Expos Joint Expose Bone Exposed Treatment Notes Wound #8 (Left Toe Third) 1. Cleanse With Wound Cleanser Soap and water 3. Primary Dressing Applied Foam Notes netting Electronic Signature(s) Signed: 10/13/2019 4:37:08 PM By: [name] EMT/HBOT Signed: 10/14/2019 5:42:11 PM By: Carlene Coria RN Entered By: [name] on 10/12/2019 16:22:57 -------------------------------------------------------------------------------- Vitals Details Patient Name: Date of Service: [name] 10/09/2019 2:15 PM Medical Record Number:[id number] Patient Account Number: [id number] Date of Birth/Sex: Treating RN: [birth year] (79 y.o. [name] Primary Care Joane Postel: Other Clinician: [name] Referring Kalea Perine: Treating Therisa Mennella/[name]:[name], [name], [name] Weeks in Treatment: 12 Vital Signs Time Taken: 14:57 Temperature (F): 98.6 Height (in): 64 Pulse (bpm): 78 Weight (lbs): 142 Respiratory Rate (breaths/min): 18 Body Mass Index (BMI): 24.4 Blood Pressure (mmHg): 113/67 Reference Range: 80 - 120 mg / dl Electronic Signature(s) Signed: 10/14/2019 5:42:11 PM By: Carlene Coria RN Entered By: Carlene Coria on 10/09/2019 14:58:02

## 2019-10-15 ENCOUNTER — Ambulatory Visit: Payer: Medicare Other | Admitting: Endocrinology

## 2019-10-15 ENCOUNTER — Encounter (HOSPITAL_BASED_OUTPATIENT_CLINIC_OR_DEPARTMENT_OTHER): Payer: Medicare Other | Admitting: Internal Medicine

## 2019-10-15 ENCOUNTER — Other Ambulatory Visit: Payer: Self-pay

## 2019-10-15 DIAGNOSIS — L97512 Non-pressure chronic ulcer of other part of right foot with fat layer exposed: Secondary | ICD-10-CM | POA: Diagnosis not present

## 2019-10-15 DIAGNOSIS — E11621 Type 2 diabetes mellitus with foot ulcer: Secondary | ICD-10-CM | POA: Diagnosis not present

## 2019-10-15 DIAGNOSIS — E1152 Type 2 diabetes mellitus with diabetic peripheral angiopathy with gangrene: Secondary | ICD-10-CM | POA: Diagnosis not present

## 2019-10-15 DIAGNOSIS — L97514 Non-pressure chronic ulcer of other part of right foot with necrosis of bone: Secondary | ICD-10-CM | POA: Diagnosis not present

## 2019-10-15 DIAGNOSIS — L97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 limited to breakdown of skin: Secondary | ICD-10-CM | POA: Diagnosis not present

## 2019-10-15 DIAGNOSIS — E11319 Type 2 diabetes mellitus with unspecified diabetic retinopathy without macular edema: Secondary | ICD-10-CM | POA: Diagnosis not present

## 2019-10-15 DIAGNOSIS — L97522 Non-pressure chronic ulcer of other part of left foot with fat layer exposed: Secondary | ICD-10-CM | POA: Diagnosis not present

## 2019-10-15 LAB — GLUCOSE, CAPILLARY
Glucose-Capillary: 134 mg/dL — ABNORMAL HIGH (ref 70–99)
Glucose-Capillary: 172 mg/dL — ABNORMAL HIGH (ref 70–99)
Glucose-Capillary: 164 mg/dL — ABNORMAL HIGH (ref 70–99)

## 2019-10-15 NOTE — Progress Notes (Signed)
[name], [name] ([id number]) Visit Report for 10/15/2019 Arrival Information Details Patient Name: Date of Service: [name], [name] 10/15/2019 2:00 PM Medical Record Number:[id number] Patient Account Number: [id number] Date of Birth/Sex: Treating RN: 14-Feb-1941 (79 y.o. F) Primary Care Parker Wherley: [name] Other Clinician: [name] Referring Shalanda Brogden: Treating Catalena Stanhope/[name]:[name], [name], [name] Weeks in Treatment: 13 Visit Information History Since Last Visit Added or deleted any medications: No Patient Arrived: Walker Any new allergies or adverse reactions: No Arrival Time: 13:50 Had a fall or experienced change in No Accompanied By: self activities of daily living that may affect Transfer Assistance: None risk of falls: Patient Identification Verified: Yes Signs or symptoms of abuse/neglect since last No Secondary Verification Process Yes visito Completed: Hospitalized since last visit: No Patient Requires Transmission- No Implantable device outside of the clinic excluding No Based Precautions: cellular tissue based products placed in the center Patient Has Alerts: Yes since last visit: Patient Alerts: Right ABI .62 TBI Pain Present Now: No refused Left ABI .60 TBI .14 Electronic Signature(s) Signed: 10/15/2019 4:35:12 PM By: [name] EMT/HBOT Entered By: [name] on 10/15/2019 14:46:13 -------------------------------------------------------------------------------- Encounter Discharge Information Details Patient Name: Date of Service: [name] 10/15/2019 2:00 PM Medical Record Number:[id number] Patient Account Number: [id number] Date of Birth/Sex: Treating RN: Mar 11, 1941 (79 y.o. F) Primary Care Juno Alers: [name] Other Clinician: [name] Referring Dj Senteno: Treating Bethel Gaglio/[name]:[name], [name], [name] Weeks in Treatment: 48 Encounter Discharge Information Items Discharge  Condition: Stable Ambulatory Status: Walker Discharge Destination: Home Transportation: Private Auto Accompanied By: self Schedule Follow-up Appointment: Yes Clinical Summary of Care: Patient Declined Electronic Signature(s) Signed: 10/15/2019 4:35:12 PM By: [name] EMT/HBOT Entered By: [name] on 10/15/2019 16:34:40 -------------------------------------------------------------------------------- Patient/Caregiver Education Details Patient Name: Date of Service: [name] 1/21/2021andnbsp2:00 PM Medical Record Patient Account Number: [id number] Number: Treating RN: Date of Birth/Gender: [birth year] (78 y.o. F) Other Clinician: [name] Primary Care Physician: Verna Czech Referring Physician: Physician/[name]: Darl Pikes in Treatment: 13 Education Assessment Education Provided To: Patient Education Topics Provided Hyperbaric Oxygenation: Methods: Explain/Verbal Responses: State content correctly Electronic Signature(s) Signed: 10/15/2019 4:35:12 PM By: [name] EMT/HBOT Entered By: [name] on 10/15/2019 16:33:56 -------------------------------------------------------------------------------- Vitals Details Patient Name: Date of Service: [name] 10/15/2019 2:00 PM Medical Record Number:[id number] Patient Account Number: [id number] Date of Birth/Sex: Treating RN: 09/10/1941 (79 y.o. F) Primary Care Shrika Milos: [name] Other Clinician: [name] Referring Delno Blaisdell: Treating Brooklin Rieger/[name]:[name], [name], [name] Weeks in Treatment: 13 Vital Signs Time Taken: 13:55 Temperature (F): 97.6 Height (in): 64 Pulse (bpm): 88 Weight (lbs): 142 Respiratory Rate (breaths/min): 17 Body Mass Index (BMI): 24.4 Blood Pressure (mmHg): 127/47 Capillary Blood Glucose (mg/dl): 172 Reference Range: 80 - 120 mg / dl Electronic Signature(s) Signed:  10/15/2019 4:35:12 PM By: [name] EMT/HBOT Entered By: [name] on 10/15/2019 14:46:42

## 2019-10-15 NOTE — Progress Notes (Addendum)
LITITIA, SEN ([id number]) Visit Report for 10/15/2019 HBO Details Patient Name: Date of Service: [name], [name] 10/15/2019 2:00 PM Medical Record Number:[id number] Patient Account Number: [id number] Date of Birth/Sex: Treating RN: [birth year] (79 y.o. F) Primary Care Aleric Froelich: [name] Other Clinician: [name] Referring Harli Engelken: Treating Nekeshia Lenhardt/[name]:[name], [name], [name] Weeks in Treatment: 13 HBO Treatment Course Details Treatment Course Number: 1 Ordering Deztinee Lohmeyer: [name] Total Treatments Ordered: 40 HBO Treatment Start Date: 10/12/2019 HBO Indication: Diabetic Ulcer(s) of the Lower Extremity HBO Treatment Details Treatment Number: 4 Patient Type: Outpatient Chamber Type: Monoplace Chamber Serial #: [id number] Treatment Protocol: 2.5 ATA with 90 minutes oxygen, with two 5 minute air breaks Treatment Details Compression Rate Down: 2.0 psi / minute De-Compression Rate Up: 2.0 psi / minute Air breaks and CompressTx Pressure breathing periods DecompressDecompress Begins Reached (leave unused spaces Begins Ends blank) Chamber Pressure (ATA)1 2.5 2.5 2.5 2.5 2.5 --2.5 1 Clock Time (24 hr) 14:16 14:28 [id number]:7628:3151:7616:07--37:10 16:20 Treatment Length: 124 (minutes) Treatment Segments: 4 Vital Signs Capillary Blood Glucose Reference Range: 80 - 120 mg / dl HBO Diabetic Blood Glucose Intervention Range: <131 mg/dl or >249 mg/dl Time Vitals Blood Respiratory Capillary Blood Glucose Pulse Action Type: Pulse: Temperature: Taken: Pressure: Rate: Glucose (mg/dl): Meter #: Oximetry (%) Taken: Pre 13:55 127/47 88 17 97.6 172 Post 16:22 148/55 79 15 98 164 Treatment Response Treatment Toleration: Well Treatment Completion Treatment Completed without Adverse Event Status: Additional Procedure Documentation Tissue Sevierity: Necrosis of bone [place] Notes No concerns with treatment given Physician HBO Attestation: I certify that I  supervised this HBO treatment in accordance with Medicare guidelines. A trained Yes emergency response team is readily available per hospital policies and procedures. Continue HBOT as ordered. Yes Electronic Signature(s) Signed: 10/15/2019 5:54:58 PM By: [name] MD Previous Signature: 10/15/2019 4:35:12 PM Version By: [name] EMT/HBOT Entered By: [name] on 10/15/2019 17:53:19 -------------------------------------------------------------------------------- HBO Safety Checklist Details Patient Name: Date of Service: [name] 10/15/2019 2:00 PM Medical Record Number:[id number] Patient Account Number: [id number] Date of Birth/Sex: Treating RN: 1941-09-23 (78 y.o. F) Primary Care Abella Shugart: [name] Other Clinician: [name] Referring Almon Whitford: Treating Manjot Beumer/[name]:[name], [name], [name] Weeks in Treatment: 13 HBO Safety Checklist Items Safety Checklist Consent Form Signed Patient voided / foley secured and emptied When did you last eato 1300 - rice soup/biscuit Last dose of injectable or oral agent metformin NA Ostomy pouch emptied and vented if applicable All implantable devices assessed, documented and approved Intravenous access site secured and place PICC line left arm Valuables secured Linens and cotton and cotton/polyester blend (less than 51% polyester) Personal oil-based products / skin lotions / body lotions removed NA Wigs or hairpieces removed NA Smoking or tobacco materials removed Books / newspapers / magazines / loose paper removed Cologne, aftershave, perfume and deodorant removed Jewelry removed (may wrap wedding band) Make-up removed Hair care products removed NA Battery operated devices (external) removed NA Heating patches and chemical warmers removed NA Titanium eyewear removed Nail polish cured greater than 10 hours NA Casting material cured greater than 10 hours NA Hearing aids removed NA  Loose dentures or partials removed NA Prosthetics have been removed Patient demonstrates correct use of air break device (if applicable) Patient concerns have been addressed Patient grounding bracelet on and cord attached to chamber Specifics for Inpatients (complete in addition to above) Medication sheet sent with patient Intravenous medications needed or due during therapy sent with patient Drainage tubes (e.g. nasogastric tube or chest tube secured and vented)  Endotracheal or Tracheotomy tube secured Cuff deflated of air and inflated with saline Airway suctioned Electronic Signature(s) Signed: 10/15/2019 2:52:40 PM By: [name] EMT/HBOT Entered By: [name] on 10/15/2019 14:52:39

## 2019-10-15 NOTE — Progress Notes (Signed)
DERIN, MATTHES ([id number]) Visit Report for 10/15/2019 SuperBill Details Patient Name: Date of Service: [name], [name] 10/15/2019 Medical Record Number:[id number] Patient Account Number: [id number] Date of Birth/Sex: Treating RN: 1941/07/27 (79 y.o. 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 other specified severity L97.528 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ity E11.42 Type 2 diabetes mellitus with diabetic polyneuropathy 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, right ankle and foot Facility Procedures The patient participates with Medicare or their insurance follows the Medicare Facility Guidelines CPT4 Code Description Modifier Quantity [id number] G0277-(Facility Use Only) HBOT, full body chamber, 78min 4 Physician Procedures CPT4 Code Description Modifier Quantity [id number] 03888 - WC PHYS HYPERBARIC OXYGEN THERAPY 1 ICD-10 Diagnosis Description E11.621 Type 2 diabetes mellitus with foot ulcer Electronic Signature(s) Signed: 10/15/2019 4:35:12 PM By: [name] EMT/HBOT Signed: 10/15/2019 5:54:58 PM By: [name] MD Entered By: [name] on 10/15/2019 16:33:43

## 2019-10-16 ENCOUNTER — Encounter (HOSPITAL_BASED_OUTPATIENT_CLINIC_OR_DEPARTMENT_OTHER): Payer: Medicare Other | Admitting: Internal Medicine

## 2019-10-16 ENCOUNTER — Other Ambulatory Visit: Payer: Self-pay

## 2019-10-16 DIAGNOSIS — L97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 limited to breakdown of skin: Secondary | ICD-10-CM | POA: Diagnosis not present

## 2019-10-16 DIAGNOSIS — E11621 Type 2 diabetes mellitus with foot ulcer: Secondary | ICD-10-CM | POA: Diagnosis not present

## 2019-10-16 DIAGNOSIS — L97522 Non-pressure chronic ulcer of other part of left foot with fat layer exposed: Secondary | ICD-10-CM | POA: Diagnosis not present

## 2019-10-16 DIAGNOSIS — E1152 Type 2 diabetes mellitus with diabetic peripheral angiopathy with gangrene: Secondary | ICD-10-CM | POA: Diagnosis not present

## 2019-10-16 DIAGNOSIS — L97512 Non-pressure chronic ulcer of other part of right foot with fat layer exposed: Secondary | ICD-10-CM | POA: Diagnosis not present

## 2019-10-16 DIAGNOSIS — L97514 Non-pressure chronic ulcer of other part of right foot with necrosis of bone: Secondary | ICD-10-CM | POA: Diagnosis not present

## 2019-10-16 DIAGNOSIS — E11319 Type 2 diabetes mellitus with unspecified diabetic retinopathy without macular edema: Secondary | ICD-10-CM | POA: Diagnosis not present

## 2019-10-16 LAB — GLUCOSE, CAPILLARY
Glucose-Capillary: 117 mg/dL — ABNORMAL HIGH (ref 70–99)
Glucose-Capillary: 140 mg/dL — ABNORMAL HIGH (ref 70–99)

## 2019-10-16 NOTE — Progress Notes (Addendum)
[name], [name] ([id number]) Visit Report for 10/16/2019 HBO Details Patient Name: Date of Service: [name], [name] 10/16/2019 3:00 PM Medical Record Number:[id number] Patient Account Number: [id number] Date of Birth/Sex: Treating RN: Jul 09, 1941 (79 y.o. F) Primary Care Thressa Shiffer: [name] Other Clinician: [name] Referring Mkayla Steele: Treating Lovelle Lema/[name]:[name], [name], [name] Weeks in Treatment: 13 HBO Treatment Course Details Treatment Course Number: 1 Ordering Shaquilla Kehres: [name] Total Treatments Ordered: 40 HBO Treatment Start Date: 10/12/2019 HBO Indication: Diabetic Ulcer(s) of the Lower Extremity HBO Treatment Details Treatment Number: 5 Patient Type: Outpatient Chamber Type: Monoplace Chamber Serial #: [id number] Treatment Protocol: 2.5 ATA with 90 minutes oxygen, with two 5 minute air breaks Treatment Details Compression Rate Down: 2.0 psi / minute De-Compression Rate Up: 2.0 psi / minute Air breaks and CompressTx Pressure breathing periods DecompressDecompress Begins Reached (leave unused spaces Begins Ends blank) Chamber Pressure (ATA)1 2.5 2.5 2.5 2.5 2.5 --2.5 1 Clock Time (24 hr) 14:58 15:10 15:4015:4516:1516:20--16:50 17:02 Treatment Length: 124 (minutes) Treatment Segments: 4 Vital Signs Capillary Blood Glucose Reference Range: 80 - 120 mg / dl HBO Diabetic Blood Glucose Intervention Range: <131 mg/dl or >249 mg/dl Time Vitals Blood Respiratory Capillary Blood Glucose Pulse Action Type: Pulse: Temperature: Taken: Pressure: Rate: Glucose (mg/dl): Meter #: Oximetry (%) Taken: Pre 14:05 130/50 86 15 98 117 Post 17:05 143/84 85 16 98 140 Treatment Response Treatment Toleration: Well Treatment Completion Treatment Completed without Adverse Event Status: Additional Procedure Documentation Tissue Sevierity: Necrosis of bone Melinda [place] Notes No concerns with treatment given Physician HBO Attestation: I certify that I  supervised this HBO treatment in accordance with Medicare guidelines. A trained Yes emergency response team is readily available per hospital policies and procedures. Continue HBOT as ordered. Yes Electronic Signature(s) Signed: 10/16/2019 5:37:40 PM By: [name] MD Previous Signature: 10/16/2019 5:14:05 PM Version By: [name] EMT/HBOT Entered By: [name] on 10/16/2019 17:36:05 -------------------------------------------------------------------------------- HBO Safety Checklist Details Patient Name: Date of Service: [name] 10/16/2019 3:00 PM Medical Record Number:[id number] Patient Account Number: [id number] Date of Birth/Sex: Treating RN: 1940-10-10 (79 y.o. F) Primary Care Tery Hoeger: [name] Other Clinician: [name] Referring Tyrice Hewitt: Treating Laren Whaling/[name]:[name], [name], [name] Weeks in Treatment: 13 HBO Safety Checklist Items Safety Checklist Consent Form Signed Patient voided / foley secured and emptied 1200 - ham and cheese sammy - When did you last eato yogurt Last dose of injectable or oral agent metformin Ostomy pouch emptied and vented if applicable NA All implantable devices assessed, documented and approved Intravenous access site secured and place PICC line left arm Valuables secured Linens and cotton and cotton/polyester blend (less than 51% polyester) Personal oil-based products / skin lotions / body lotions removed Wigs or hairpieces removed Smoking or tobacco materials removed NA Books / newspapers / magazines / loose paper removed Cologne, aftershave, perfume and deodorant removed Jewelry removed (may wrap wedding band) Make-up removed Hair care products removed Battery operated devices (external) removed NA Heating patches and chemical warmers removed NA Titanium eyewear removed NA Nail polish cured greater than 10 hours Casting material cured greater than 10 hours NA Hearing aids  removed NA Loose dentures or partials removed NA Prosthetics have been removed NA Patient demonstrates correct use of air break device (if applicable) Patient concerns have been addressed Patient grounding bracelet on and cord attached to chamber Specifics for Inpatients (complete in addition to above) Medication sheet sent with patient Intravenous medications needed or due during therapy sent with patient Drainage tubes (e.g. nasogastric tube or chest tube  secured and vented) Endotracheal or Tracheotomy tube secured Cuff deflated of air and inflated with saline Airway suctioned Electronic Signature(s) Signed: 10/16/2019 3:15:19 PM By: [name] EMT/HBOT Entered By: [name] on 10/16/2019 15:15:19

## 2019-10-16 NOTE — Progress Notes (Addendum)
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 -------------------------------------------------------------------------------- Wound Assessment Details Patient Name: Date of Service: [name] 10/16/2019 2:00 PM Medical Record Number:[id number] Patient Account Number: [id number] Date of Birth/Sex: Treating RN: 1941/01/27 (79 y.o. F) Primary Care Cynthya Yam: [name] Other Clinician: Referring Mathhew Buysse: Treating Darcel Frane/[name]:[name], [name], [name] Weeks in Treatment: 13 Wound Status Wound Number: 9 Primary Diabetic Wound/Ulcer of the Lower Extremity Etiology: Wound Location: Left Achilles Wound Open Wounding Event: Trauma Status: Date Acquired: 10/15/2019 Comorbid Deep Vein  Thrombosis, Hypertension, Weeks Of Treatment: 0 History: Peripheral Arterial Disease, Type II Diabetes Clustered Wound: No Photos Wound Measurements Length: (cm) 0.5 Width: (cm) 0.5 Depth: (cm) 0.1 Area: (cm) 0.196 Volume: (cm) 0.02 Wound Description Classification: Grade 1 Wound Margin: Flat and Intact Exudate Amount: Small Exudate Type: Serous Exudate Color: amber Wound Bed Granulation Amount: Large (67-100%) Granulation Quality: Pink Necrotic Amount: None Present (0%) After Cleansing: No brino No Exposed Structure posed: No (Subcutaneous Tissue) Exposed: No posed: No posed: No osed: No sed: No o Skin Breakdown % Reduction in Area: 0% % Reduction in Volume: 0% Epithelialization: Small (1-33%) Tunneling: No Undermining: No Foul Odor Slough/Fi Fascia Ex Fat Layer Tendon Ex Muscle Ex Joint Exp Bone Expo Limited t Treatment Notes Wound #9 (Left Achilles) 1. Cleanse With Wound Cleanser 3. Primary Dressing Applied Collegen AG 4. Secondary Dressing Foam Electronic Signature(s) Signed: 10/19/2019 4:40:42 PM By: [name] EMT/HBOT Previous Signature: 10/16/2019 4:58:06 PM Version By: Baruch Gouty RN, BSN Entered By: [name] on 10/19/2019 15:27:24 -------------------------------------------------------------------------------- Vitals Details Patient Name: Date of Service: [name] 10/16/2019 2:00 PM Medical Record Number:[id number] Patient Account Number: [id number] Date of Birth/Sex: Treating RN: 01-05-1941 (79 y.o. [name] Primary Care Bonita Brindisi: [name] Other Clinician: Referring Haiden Clucas: Treating Khairi Garman/[name]:[name], [name], [name] Weeks in Treatment: 13 Vital Signs Time Taken: 14:10 Temperature (F): 98 Height (in): 64 Pulse (bpm): 86 Source: Stated Respiratory Rate (breaths/min): 15 Weight (lbs): 142 Blood Pressure (mmHg): 130/50 Source: Stated Capillary Blood Glucose (mg/dl):  117 Body Mass Index (BMI): 24.4 Reference Range: 80 - 120 mg / dl Electronic Signature(s) Signed: 10/16/2019 4:58:06 PM By: Baruch Gouty RN, BSN Entered By: Baruch Gouty on 10/16/2019 14:11:12

## 2019-10-16 NOTE — Progress Notes (Addendum)
CARON, ODE ([id number]) Visit Report for 10/16/2019 HPI Details Patient Name: Date of Service: [name], [name] 10/16/2019 2:00 PM Medical Record Number:[id number] Patient Account Number: [id number] Date of Birth/Sex: Treating RN: [birth year] (79 y.o. F) Primary Care Provider: [name] Other Clinician: Referring Provider: Treating Provider/[name]:Selene Peltzer, [name], [name] Weeks in Treatment: 13 History of Present Illness HPI Description: ADMISSION 07/13/2019 Patient is a 79 year old type II diabetic. She has known PAD. She has been followed by Dr. Jacqualyn Posey of podiatry for blistering areas on her toes dating back to 04/30/2019 which at this was the left fourth toe. By 8/11 she had wounds on the right and left second toes. She underwent arterial studies by Dr. Alvester Chou on 7/31 that showed ABIs on the right of 0.60 TBI of 0.26 on the left ABI of 0.56 and a TBI of 0.25. By 9/10 she had ischemic-looking wounds per Dr. Earleen Newport on the right first, left first second and third. She has been using Medihoney and then mupirocin more recently simply Betadine. The patient underwent angiography by Dr. Gwenlyn Found on 9/21. 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 Noted that there was 1 vessel runoff in the foot via the peroneal. On the left there was an 80% left SFA, 70% mid left SFA. There was a short segment calcified CTO distal less than SFA and above-knee popliteal artery reconstituting with two-vessel runoff. 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 It was felt that she had bilateral SFA disease as well as tibial vessel disease. An attempt was made to revascularize the left SFA but they were unable to cross because of the highly calcified nature of the lesion. 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 She has an area on the medial left fourth toe with raised horned callus on top of this. I am not certain what this represents. With regards to pain she has about a 2-1/2 I will claudication tolerance in the grocery store. She has some pain in night which is relieved by putting her feet down over the side of the bed. She is wearing Nitro-Dur patches on the top of her right foot. Past medical history includes type 2 diabetes with secondary PAD, neoplasm of the skin, diabetic retinopathy, carotid artery stenosis, hyperlipidemia hypertension. 11/2; the last time the patient was here I spoke to Dr. Gwenlyn Found about revascularization options on the right. As I understand things currently Dr. Gwenlyn Found spoke with Dr. Trula Slade and ultimately the patient was taken to surgery on 10/27. She had a right iliofemoral endarterectomy with a bovine patch angioplasty. I think the plan now is for her to have a staged intervention on the right SFA by Dr. Alvester Chou. Per the patient's understanding Dr. Gwenlyn Found and Dr. Jacqualyn Posey are waiting to see when the gangrenous toes on the right foot can be amputated. The patient states her pain is a lot better and she is grateful for that. 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 Small area on the left fourth toe. She is using Betadine to the mummified areas on the right and Medihoney on the left 08/27/2019; the last time I saw this patient I discharged her from the clinic. She had been revascularized by Dr. Trula Slade and she had a right  iliofemoral endarterectomy with a bovine angioplasty. 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 Jacqualyn Posey of podiatry on 08/07/2019. I note that she also had a intervention by Dr. Gwenlyn Found and he performed a directional atherectomy and drug-coated balloon angioplasty of the SFA and popliteal artery on the right. I am not certain  of the date of Dr. Kennon Holter procedure as of the time of this dictation. She was referred back to [place] by Dr. Earleen Newport predominantly for follow-up of the left fourth toe. She still has sutures and stitches in the right TMA site. She states her pain is a lot better. She expresses concern about the condition of the amputation site at the TMA. She is on doxycycline I think prescribed by Dr. Earleen Newport. She is complaining of some pain at night 12/10; I spoke to Dr. Jacqualyn Posey last week. He removed the sutures on the right foot on Monday of this week. She has the area on the left fourth toe just proximal to the PIP and then the right TMA site. She is still on doxycycline and has enough through next week. Unfortunately the TMA site does not look good at all. Both on the lateral and medial part of the incisions are areas that probe to bone. There is purulence over the medial part which I have cultured. We will use silver alginate. Left fourth toe looks somewhat better but there was still exposed bone 12/17; patient has an MRI booked for 12/30. Culture I did last week showed Pseudomonas Serratia and Enterococcus. This was purulent drainage coming out of the medial part of her amputation site. I use cefdinir 300 twice daily for 10 days that started on 12/15. Her x-ray on the right showed limited evaluation for osteomyelitis. The findings could have been postoperative. There was subtle erosion in the distal first and distal fifth metatarsal. An MRI was suggested. On the left she had irregularity of the fourth middle and proximal phalanx consistent with a history of osteomyelitis. I do not know that she has a history of osteomyelitis in this area. She had a newly defined area on the plantar third toe 12/31; patient's MRI is listed below: MRI OF THE RIGHT FOREFOOT WITHOUT AND WITH CONTRAST TECHNIQUE: Multiplanar, multisequence MR imaging of the right forefoot was performed before and after the administration of  intravenous contrast. CONTRAST: 6 mL GADAVIST IV SOLN COMPARISON: Plain films right foot 09/04/2019. FINDINGS: Bones/Joint/Cartilage The patient is status post transmetatarsal amputation as seen on the prior exam. Marrow edema and enhancement are seen in all of the distal metatarsals. In the first metatarsal, signal change extends 1.5 cm proximal to the stump and in the second metatarsal extends approximately 2 cm proximal to the stump. Edema and enhancement are seen in only the distal 0.7 cm of the third metatarsal stump and tips of the fourth and fifth metatarsals. Bone marrow signal is otherwise unremarkable. A small focus of subchondral edema is seen in the lateral talus, likely degenerative. Ligaments Intact. Muscles and Tendons No intramuscular fluid collection. Soft tissues Skin ulceration is seen off the stump of the first metatarsal. A thin fluid collection tracks deep to the wound and over the anterior metatarsals worrisome for small abscess. Intense subcutaneous edema and enhancement are seen diffusely. 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 Cellulitis about the foot. Skin ulceration over the distal first metatarsal is identified with  a thin fluid collection tracking anteriorly along the stump worrisome for abscess. Small focus of subchondral edema in the lateral talus is likely degenerative. Electronically Signed By: Inge Rise M.D. On: 09/23/2019 15:25 Patient arrives in clinic today not complaining any of any pain. 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 She does not describe claudication however her activity is very limited. 10/01/2019; since the patient was last here I have communicated with Dr. Gwenlyn Found after bypass by Dr. Trula Slade  and addressing the right superficial femoral artery he states that she is widely patent through peroneal arteries to the ankle with collaterals to the dorsalis pedis. He states he is going to talk to colleagues about the feasibility of tibial pedal access. The patient seems infectious disease later this afternoon Dr. Baxter Flattery. in preparation for this she has been off antibiotics for 1 week and I went ahead and obtain pieces of the remanence of her first metatarsal for pathology and CandS. The patient is a candidate for hyperbaric oxygen. 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 1/15; considerable improvement in the wounds on her feet. We are using silver collagen. She follows with Dr. Baxter Flattery of infectious disease and is on meropenem and daptomycin. She has been taught how to do this herself at home. Follows with Dr. Graylon Good at the end of treatment here. She has 2 wounds on the surgical TMA site 1 lateral and 1 medial the lateral 1 has regressed tremendously. The area medially still has some exposed bone although the base of this looks healthy. 1/22; 2 separate wounds on the surgical TMA site. Both of these look satisfactory. The medial area does not have any exposed bone. This is an improvement On the left side fourth toe dorsally over the proximal phalanx there is a deep punched out area that probes to bone. She has an area on the tip of the left third toe. She also tells [place] that in HBO she traumatized her left Achilles and this is left her with a superficial wound area Electronic Signature(s) Signed: 10/22/2019 3:31:22 PM By: [name] MD Previous Signature: 10/22/2019 1:52:09 PM Version By: [name] MD Previous Signature: 10/16/2019 5:37:40 PM Version By: [name] MD Entered By: [name] on 10/22/2019 14:24:43 -------------------------------------------------------------------------------- Physical Exam Details Patient  Name: Date of Service: [name] 10/16/2019 2:00 PM Medical Record Number:[id number] Patient Account Number: [id number] Date of Birth/Sex: Treating RN: 19-Oct-1940 (79 y.o. F) Primary Care Provider: [name] Other Clinician: Referring Provider: Treating Provider/[name]:Kiva Norland, [name], [name] Weeks in Treatment: 13 Constitutional Sitting or standing Blood Pressure is within target range for patient.. Pulse regular and within target range for patient.Marland Kitchen Respirations regular, non-labored and within target range.. Temperature is normal and within the target range for the patient.Marland Kitchen Appears in no distress. Eyes . Cardiovascular Pedal pulses absent bilaterally.. Notes Wound exam The right TMA site x2 the medial part no longer has any exposed bone lateral part looks healthy. No debridement. The left fourth toe wound has deteriorated now with exposed bone. She has an area on the tip of her left third toe not nearly epithelialized like last week. She has the new traumatic wound on the left mid Achilles which she apparently did hear stepping up onto the HBO table. Electronic Signature(s) Signed: 10/16/2019 5:37:40 PM By: [name] MD Entered By: [name] on 10/16/2019 14:42:49 -------------------------------------------------------------------------------- Physician Orders Details Patient Name: Date of Service:  [name], [name] 10/16/2019 2:00 PM Medical Record Number:[id number] Patient Account Number: [id number] Date of Birth/Sex: Treating RN: 14-Dec-1940 (79 y.o. 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 - schedule before HBO Dressing Change Frequency Change Dressing every other day. - home health to change twice a week. wound center weekly. Wound Cleansing Clean wound with Wound Cleanser Primary Wound Dressing Wound #1 Left,Dorsal Toe Fourth Silver Collagen - moisten with hydrogel. Wound #7 Right Amputation Site - Transmetatarsal Silver Collagen - moisten with hydrogel. Wound #8 Left Toe Third Silver Collagen - moisten with hydrogel Wound #9 Left Achilles Silver Collagen - moisten with hydrogel Wound #10 Right,Lateral Amputation Site - Transmetatarsal Silver Collagen - moisten with Hydrogel Secondary Dressing Wound #1 Left,Dorsal Toe Fourth Kerlix/Rolled Gauze Wound #7 Right Amputation Site - Transmetatarsal Kerlix/Rolled Gauze ABD pad Wound #8 Left Toe Third Kerlix/Rolled Gauze Dry Gauze Wound #9 Left Achilles Foam Wound #10 Right,Lateral Amputation Site - Transmetatarsal Kerlix/Rolled Gauze Edema Control Avoid standing for long periods of time Elevate legs to the level of the heart or above for 30 minutes daily and/or when sitting, a frequency of: - throughout the day. Off-Loading DH Walker Boot to: - right foot [place] skilled nursing for wound care. - [facility] twice a week. Hyperbaric Oxygen Therapy Evaluate for HBO Therapy Indication: - wagner grade diabetic foot ulcer right foot If appropriate for treatment, begin HBOT per protocol: 2.5 ATA for 90 Minutes with 2 Five (5) Minute Air Breaks Total Number of Treatments: - 40 One treatments per day (delivered Monday  through Friday unless otherwise specified in Special Instructions below): Antihistamine 30 minutes prior to HBO Treatment, difficulty clearing ears. Finger stick Blood Glucose Pre- and Post- HBOT Treatment. Follow Hyperbaric Oxygen Glycemia Protocol GLYCEMIA INTERVENTIONS PROTOCOL PRE-HBO GLYCEMIA INTERVENTIONS ACTION INTERVENTION Obtain pre-HBO capillary blood 1 glucose (ensure physician order is in chart). A. Notify HBO physician and await physician orders. 2 If result is 70 mg/dl or below: B. If the result meets the hospital definition of a critical result, follow hospital policy. A. Give patient an 8 ounce Glucerna Shake, an 8 ounce Ensure, or 8 ounces of a Glucerna/Ensure equivalent dietary supplement*. B. Wait 30 minutes. C. Retest patients capillary blood If result is 71 mg/dl to 130 mg/dl: glucose (CBG). D. If result greater than or equal to 110 mg/dl, proceed with HBO. If result less than 110 mg/dl, notify HBO physician and consider holding HBO. If result is 131 mg/dl to 249 mg/dl: A. Proceed with HBO. A. Notify HBO physician and await physician orders. B. It is recommended to hold HBO If result is 250 mg/dl or greater: and do blood/urine ketone testing. C. If the result meets the hospital definition of a critical result, follow hospital policy. POST-HBO GLYCEMIA INTERVENTIONS ACTION INTERVENTION Obtain post HBO capillary blood 1 glucose (ensure physician order is in chart). A. Notify HBO physician and await physician orders. 2 If result is 70  mg/dl or below: B. If the result meets the hospital definition of a critical result, follow hospital policy. A. Give patient an 8 ounce Glucerna Shake, an 8 ounce Ensure, or 8 ounces of a Glucerna/Ensure equivalent dietary supplement*. B. Wait 15 minutes for symptoms of hypoglycemia (i.e. nervousness, anxiety, sweating, chills, If result is 71 mg/dl to 100 mg/dl: clamminess, irritability,  confusion, tachycardia or dizziness). C. If patient asymptomatic, discharge patient. If patient symptomatic, repeat capillary blood glucose (CBG) and notify HBO physician. If result is 101 mg/dl to 249 mg/dl: A. Discharge patient. A. Notify HBO physician and await physician orders. B. It is recommended to do If result is 250 mg/dl or greater: blood/urine ketone testing. C. If the result meets the hospital definition of a critical result, follow hospital policy. *Juice or candies are NOT equivalent products. If patient refuses the Glucerna or Ensure, please consult the hospital dietitian for an appropriate substitute. Electronic Signature(s) Signed: 10/16/2019 5:37:40 PM By: [name] MD Signed: 10/16/2019 5:39:40 PM By: Kela Millin Entered By: Kela Millin on 10/16/2019 14:45:41 -------------------------------------------------------------------------------- Problem List Details Patient Name: Date of Service: [name] 10/16/2019 2:00 PM Medical Record Number:[id number] Patient Account Number: [id number] Date of Birth/Sex: Treating RN: 31-Oct-1940 (79 y.o. [name] Primary Care Provider: [name] Other Clinician: Referring Provider: Treating Provider/[name]:Azari Hasler, [name], [name] Weeks in Treatment: 13 Active Problems ICD-10 Evaluated Encounter Code Description Active Date Today Diagnosis E11.621 Type 2 diabetes mellitus with foot ulcer 07/13/2019 No Yes E11.52 Type 2 diabetes mellitus with diabetic peripheral 07/13/2019 No Yes angiopathy with gangrene L97.518 Non-pressure chronic ulcer of other part of right foot 07/13/2019 No Yes with other specified severity L97.528 Non-pressure chronic ulcer of other part of left foot 07/13/2019 No Yes with other specified severity E11.42 Type 2 diabetes mellitus with diabetic polyneuropathy 07/13/2019 No Yes 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 F) Primary Care Provider: [name] Other Clinician: Referring Provider: Treating Provider/[name]:Deshayla Empson, [name], [name] Weeks in Treatment: 13 Subjective History of Present Illness (HPI) ADMISSION 07/13/2019 Patient is a 79 year old type II diabetic. She has known PAD. She has been followed by Dr. Jacqualyn Posey of podiatry for blistering areas on her toes dating back to 04/30/2019 which at this was the left fourth toe. By 8/11 she had wounds on the right and left second toes. She underwent arterial studies by Dr. Alvester Chou on 7/31 that showed ABIs on the right of 0.60 TBI of 0.26 on the left ABI of 0.56 and a TBI of 0.25. By 9/10 she had ischemic-looking wounds per Dr. Earleen Newport on the right first, left first second and third. She has been using Medihoney and then mupirocin more recently simply Betadine. The patient underwent angiography by Dr. Gwenlyn Found on 9/21. 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 Noted that there was 1 vessel runoff in the foot via the peroneal. ooOn the left there was an 80% left SFA, 70% mid left SFA. There was a short segment  calcified CTO distal less than SFA and above-knee popliteal artery reconstituting with two-vessel runoff. 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 It was  felt that she had bilateral SFA disease as well as tibial vessel disease. An attempt was made to revascularize the left SFA but they were unable to cross because of the highly calcified nature of the lesion. 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 She has an area on the medial left fourth toe with raised horned callus on top of this. I am not certain what this represents. With regards to pain she has about a 2-1/2 I will claudication tolerance in the grocery store. She has some pain in night which is relieved by putting her feet down over the side of the bed. She is wearing Nitro-Dur patches on the top of her right foot. Past medical history includes type 2 diabetes with secondary PAD, neoplasm of the skin, diabetic retinopathy, carotid artery stenosis, hyperlipidemia hypertension. 11/2; the last time the patient was here I spoke to Dr. Gwenlyn Found about revascularization options on the right. As I understand things currently Dr. Gwenlyn Found spoke with Dr. Trula Slade and ultimately the patient was taken to surgery on 10/27. She had a right iliofemoral endarterectomy with a bovine patch angioplasty. I think the plan now is for her to have a staged intervention on the right SFA by Dr. Alvester Chou. Per the patient's understanding Dr. Gwenlyn Found and Dr. Jacqualyn Posey are waiting to see when the gangrenous toes on the right foot can be amputated. The patient states her pain is a lot better and she is grateful for that. 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 Small area on the left fourth toe. She is using Betadine to the mummified areas on the right and Medihoney on the left 08/27/2019; the last time I saw this patient I discharged her from the clinic. She had been revascularized by Dr. Trula Slade and she had a right iliofemoral endarterectomy with a bovine angioplasty. 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 Jacqualyn Posey of podiatry on 08/07/2019. I note that she also had a intervention by Dr. Gwenlyn Found and he performed a directional atherectomy and drug-coated balloon angioplasty of the SFA and popliteal artery on the right. I am not certain of the date of Dr. Kennon Holter procedure as of the time of this dictation. She was referred back to [place] by Dr. Earleen Newport predominantly for follow-up of the left fourth toe. She still has sutures and stitches in the right TMA site. She states her pain is a lot better. She expresses concern about the condition of the amputation site at the TMA. She is on doxycycline I think prescribed by Dr. Earleen Newport. She is complaining of some pain at night 12/10; I spoke to Dr. Jacqualyn Posey last week. He removed the sutures on the right foot on Monday of this week. She has the area on the left fourth toe just proximal to the PIP and then the right TMA site. She is still on doxycycline and has enough through next week. Unfortunately the TMA site does not look good at all. Both on the lateral and medial part of the incisions are areas that probe to bone. There is purulence over the medial part which I have cultured. We will use silver alginate. Left fourth toe looks somewhat better  but there was still exposed bone 12/17; patient has an MRI booked for 12/30. Culture I did last week showed Pseudomonas Serratia and Enterococcus. This was purulent drainage coming out of the medial part of her amputation site. I use cefdinir 300 twice daily for 10 days that started on 12/15. Her x-ray on the right showed limited evaluation for osteomyelitis. The findings could have been postoperative. There was subtle erosion in the distal first and distal fifth metatarsal. An MRI was suggested. On the left she had irregularity of the fourth middle and proximal phalanx consistent with a history of osteomyelitis. I do not know that she has a  history of osteomyelitis in this area. She had a newly defined area on the plantar third toe 12/31; patient's MRI is listed below: MRI OF THE RIGHT FOREFOOT WITHOUT AND WITH CONTRAST TECHNIQUE: Multiplanar, multisequence MR imaging of the right forefoot was performed before and after the administration of intravenous contrast. CONTRAST: 6 mL GADAVIST IV SOLN COMPARISON: Plain films right foot 09/04/2019. FINDINGS: Bones/Joint/Cartilage The patient is status post transmetatarsal amputation as seen on the prior exam. Marrow edema and enhancement are seen in all of the distal metatarsals. In the first metatarsal, signal change extends 1.5 cm proximal to the stump and in the second metatarsal extends approximately 2 cm proximal to the stump. Edema and enhancement are seen in only the distal 0.7 cm of the third metatarsal stump and tips of the fourth and fifth metatarsals. Bone marrow signal is otherwise unremarkable. A small focus of subchondral edema is seen in the lateral talus, likely degenerative. Ligaments Intact. Muscles and Tendons No intramuscular fluid collection. Soft tissues Skin ulceration is seen off the stump of the first metatarsal. A thin fluid collection tracks deep to the wound and over the anterior metatarsals worrisome for small abscess. Intense subcutaneous edema and enhancement are seen diffusely. 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 Cellulitis about the foot. Skin ulceration over the distal first metatarsal is identified with a thin fluid collection tracking anteriorly along the stump worrisome for abscess. Small focus of subchondral edema in the lateral talus is likely degenerative. Electronically Signed By: Inge Rise M.D. On: 09/23/2019 15:25 Patient arrives in clinic today not complaining any of any  pain. 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 She does not describe claudication however her activity is very limited. 10/01/2019; since the patient was last here I have communicated with Dr. Gwenlyn Found after bypass by Dr. Trula Slade and addressing the right superficial femoral artery he states that she is widely patent through peroneal arteries to the ankle with collaterals to the dorsalis pedis. He states he is going to talk to colleagues about the feasibility of tibial pedal access. The patient seems infectious disease later this afternoon Dr. Baxter Flattery. in preparation for this she has been off antibiotics for 1 week and I went ahead and obtain pieces of the remanence of her first metatarsal for pathology and CandS. The patient is a candidate for hyperbaric oxygen. 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 1/15; considerable improvement in the wounds on her feet. We are using silver collagen. She follows with Dr. Baxter Flattery of infectious disease and is on meropenem and daptomycin. She has been taught how to do this herself at home. Follows with Dr. Graylon Good at the  end of treatment here. She has 2 wounds on the surgical TMA site 1 lateral and 1 medial the lateral 1 has regressed tremendously. The area medially still has some exposed bone although the base of this looks healthy. 1/22; 2 separate wounds on the surgical TMA site. Both of these look satisfactory. The medial area does not have any exposed bone. This is an improvement On the left side fourth toe dorsally over the proximal phalanx there is a deep punched out area that probes to bone. She has an area on the tip of the left third toe. She also tells [place] that in HBO she traumatized her left Achilles and this is left her with a superficial wound area Objective Constitutional Sitting or standing Blood Pressure is within target  range for patient.. Pulse regular and within target range for patient.Marland Kitchen Respirations regular, non-labored and within target range.. Temperature is normal and within the target range for the patient.Marland Kitchen Appears in no distress. Vitals Time Taken: 2:10 PM, Height: 64 in, Source: Stated, Weight: 142 lbs, Source: Stated, BMI: 24.4, Temperature: 98 F, Pulse: 86 bpm, Respiratory Rate: 15 breaths/min, Blood Pressure: 130/50 mmHg, Capillary Blood Glucose: 117 mg/dl. Cardiovascular Pedal pulses absent bilaterally.. General Notes: Wound exam ooThe right TMA site x2 the medial part no longer has any exposed bone lateral part looks healthy. No debridement. ooThe left fourth toe wound has deteriorated now with exposed bone. She has an area on the tip of her left third toe not nearly epithelialized like last week. ooShe has the new traumatic wound on the left mid Achilles which she apparently did hear stepping up onto the HBO table. Integumentary (Hair, Skin) Wound #1 status is Open. Original cause of wound was Blister. The wound is located on the Left,Dorsal Toe Fourth. The wound measures 0.6cm length x 0.4cm width x 0.5cm depth; 0.188cm^2 area and 0.094cm^3 volume. There is bone and Fat Layer (Subcutaneous Tissue) Exposed exposed. There is no tunneling or undermining noted. There is a small amount of serosanguineous drainage noted. The wound margin is distinct with the outline attached to the wound base. There is large (67-100%) red granulation within the wound bed. There is a small (1-33%) amount of necrotic tissue within the wound bed including Adherent Slough. Wound #10 status is Open. Original cause of wound was Surgical Injury. The wound is located on the Right,Lateral Amputation Site - Transmetatarsal. The wound measures 0.3cm length x 0.7cm width x 0.2cm depth; 0.165cm^2 area and 0.033cm^3 volume. There is Fat Layer (Subcutaneous Tissue) Exposed exposed. There is no tunneling or undermining noted.  There is a small amount of serosanguineous drainage noted. The wound margin is distinct with the outline attached to the wound base. There is large (67-100%) red granulation within the wound bed. There is a small (1-33%) amount of necrotic tissue within the wound bed including Adherent Slough. Wound #7 status is Open. Original cause of wound was Surgical Injury. 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 The wound measures 1.1cm length x 2.9cm width x 1cm depth; 2.505cm^2 area and 2.505cm^3 volume. There is Fat Layer (Subcutaneous Tissue) Exposed exposed. There is no tunneling or undermining noted. There is a medium amount of serosanguineous drainage noted. The wound margin is well defined and not attached to the wound base. There is medium (34-66%) red granulation within the wound bed. There is a medium (34-66%) amount of necrotic tissue within the wound bed including Adherent Slough. Wound #8 status is Open. Original cause of wound  was Gradually Appeared. The wound is located on the Left Toe Third. The wound measures 0.6cm length x 0.8cm width x 0.1cm depth; 0.377cm^2 area and 0.038cm^3 volume. There is Fat Layer (Subcutaneous Tissue) Exposed exposed. There is no tunneling or undermining noted. There is a small amount of serosanguineous drainage noted. The wound margin is distinct with the outline attached to the wound base. There is small (1-33%) pink granulation within the wound bed. There is a large (67-100%) amount of necrotic tissue within the wound bed including Eschar and Adherent Slough. Wound #9 status is Open. Original cause of wound was Trauma. The wound is located on the Left Achilles. The wound measures 0.5cm length x 0.5cm width x 0.1cm depth; 0.196cm^2 area and 0.02cm^3 volume. The wound is limited to skin breakdown. There is no tunneling or undermining noted. There is a small amount of serous drainage noted. The wound margin is flat and intact. There is  large (67-100%) pink granulation within the wound bed. There is no necrotic tissue within the wound bed. 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 - schedule before HBO Dressing Change Frequency: Change Dressing every other day. - home health to change twice a week. wound center weekly. Wound Cleansing: Clean wound with Wound Cleanser Primary Wound Dressing: Wound #1 Left,Dorsal Toe Fourth: Silver Collagen - moisten with hydrogel. Wound #7 Right Amputation Site - Transmetatarsal: Silver Collagen - moisten with hydrogel. Wound #8 Left Toe Third: Silver Collagen - moisten with hydrogel Wound #9 Left Achilles: Silver Collagen - moisten with hydrogel Wound #10 Right,Lateral Amputation Site - Transmetatarsal: Silver Collagen - moisten with Hydrogel Secondary Dressing: Wound #1 Left,Dorsal Toe Fourth: Kerlix/Rolled Gauze Wound #7 Right Amputation Site - Transmetatarsal: Kerlix/Rolled Gauze ABD pad Wound #8 Left Toe Third: Kerlix/Rolled Gauze Dry Gauze Wound #9 Left Achilles: Foam Wound #10 Right,Lateral Amputation Site - Transmetatarsal: Kerlix/Rolled Gauze Edema Control: Avoid standing for long periods of time Elevate legs to the level of the heart or above for 30 minutes daily and/or when sitting, a frequency of: - throughout the day. Off-Loading: DH Walker Boot to: - right foot Home Health: [facility] for wound care. - [facility]  home health twice a week. Hyperbaric Oxygen Therapy: Evaluate for HBO Therapy Indication: - wagner grade diabetic foot ulcer right foot If appropriate for treatment, begin HBOT per protocol: 2.5 ATA for 90 Minutes with 2 Five (5) Minute Air Breaks Total Number of Treatments: - 40 One treatments per day (delivered Monday through Friday unless otherwise specified in Special Instructions below): Antihistamine 30 minutes prior to HBO Treatment, difficulty clearing ears. Finger stick Blood Glucose Pre- and Post- HBOT Treatment. Follow Hyperbaric Oxygen Glycemia Protocol 1. Continue with silver collagen 2. I will need to look over Dr. Kennon Holter studies on the left side. She has had extensive revascularization on the right. I wonder if anything can be done on the left. The patient states she did not think so but nevertheless we will look over the records 3. Wounds will need debridement next week I will need to see her  in her room before HBO. Electronic Signature(s) Signed: 10/27/2019 5:22:39 PM By: [name] MD Signed: 11/19/2019 2:24:55 PM By: [name] RN, BSN Previous Signature: 10/16/2019 5:37:40 PM Version By: [name] MD Entered By: [name] on 10/27/2019 10:28:33 -------------------------------------------------------------------------------- SuperBill Details Patient Name: Date of Service: [name] 10/16/2019 Medical Record Number:[id number] Patient Account Number: [id number] Date of Birth/Sex: Treating RN: Dec 15, 1940 (79 y.o. [name] Primary Care Provider: [name] Other Clinician: Referring Provider: Treating Provider/[name]:Kelley Knoth, [name], [name] Weeks in Treatment: 13 Diagnosis Coding ICD-10 Codes Code Description E11.621 Type 2 diabetes mellitus with foot ulcer E11.52 Type 2 diabetes mellitus with diabetic peripheral angiopathy with gangrene L97.518 Non-pressure chronic ulcer of other part of right foot with  other specified severity L97.528 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ity E11.42 Type 2 diabetes mellitus with diabetic polyneuropathy 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, 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 Facility Procedures The patient participates with Medicare or their insurance follows the Medicare Facility Guidelines: CPT4 Code Description Modifier Quantity [id number] 99215 - WOUND CARE VISIT-LEV 5 EST PT 1 Electronic Signature(s) Signed: 10/16/2019 5:37:40 PM By: [name] MD Entered By: [name] on 10/16/2019 14:44:05

## 2019-10-16 NOTE — Progress Notes (Signed)
SHALEAH, NISSLEY ([id number]) Visit Report for 10/16/2019 Arrival Information Details Patient Name: Date of Service: LAINA, GUERRIERI 10/16/2019 3:00 PM Medical Record Number:[id number] Patient Account Number: [id number] Date of Birth/Sex: Treating RN: [birth year] (79 y.o. F) Primary Care Earlie Arciga: [name] Other Clinician: [name] Referring Maleni Seyer: Treating Captola Teschner/[name]:[name], [name], [name] Weeks in Treatment: 13 Visit Information History Since Last Visit Added or deleted any medications: No Patient Arrived: Walker Any new allergies or adverse reactions: No Arrival Time: 14:00 Had a fall or experienced change in No Accompanied By: self activities of daily living that may affect Transfer Assistance: None risk of falls: Patient Identification Verified: Yes Signs or symptoms of abuse/neglect since last No Secondary Verification Process Yes visito Completed: Hospitalized since last visit: No Patient Requires Transmission- No Implantable device outside of the clinic excluding No Based Precautions: cellular tissue based products placed in the center Patient Has Alerts: Yes since last visit: Patient Alerts: Right ABI .62 TBI Pain Present Now: No refused Left ABI .60 TBI .14 Electronic Signature(s) Signed: 10/16/2019 5:14:05 PM By: [name] EMT/HBOT Entered By: [name] on 10/16/2019 15:13:56 -------------------------------------------------------------------------------- Encounter Discharge Information Details Patient Name: Date of Service: TONIE, ELSEY 10/16/2019 3:00 PM Medical Record Number:[id number] Patient Account Number: [id number] Date of Birth/Sex: Treating RN: [birth year] (79 y.o. F) Primary Care Kearah Gayden: [name] Other Clinician: [name] Referring Jamond Neels: Treating Vershawn Westrup/[name]:[name], [name], [name] Weeks in Treatment: 57 Encounter Discharge Information Items Discharge  Condition: Stable Ambulatory Status: Walker Discharge Destination: Home Transportation: Private Auto Accompanied By: self Schedule Follow-up Appointment: Yes Clinical Summary of Care: Patient Declined Electronic Signature(s) Signed: 10/16/2019 5:14:05 PM By: [name] EMT/HBOT Entered By: [name] on 10/16/2019 17:13:26 -------------------------------------------------------------------------------- Patient/Caregiver Education Details Patient Name: Date of Service: Courts, Klara H. 1/22/2021andnbsp3:00 PM Medical Record Patient Account Number: [id number] Number: Treating RN: Date of Birth/Gender: 1941/07/17 (78 y.o. F) Other Clinician: [name] Primary Care Physician: Verna Czech Referring Physician: Physician/[name]: Darl Pikes in Treatment: 13 Education Assessment Education Provided To: Patient Education Topics Provided Hyperbaric Oxygenation: Methods: Explain/Verbal Responses: State content correctly Electronic Signature(s) Signed: 10/16/2019 5:14:05 PM By: [name] EMT/HBOT Entered By: [name] on 10/16/2019 17:13:16 -------------------------------------------------------------------------------- Vitals Details Patient Name: Date of Service: [name] 10/16/2019 3:00 PM Medical Record Number:[id number] Patient Account Number: [id number] Date of Birth/Sex: Treating RN: 1941-05-15 (79 y.o. F) Primary Care Ceasar Decandia: [name] Other Clinician: [name] Referring Kody Brandl: Treating Jovonte Commins/[name]:[name], [name], [name] Weeks in Treatment: 13 Vital Signs Time Taken: 14:05 Temperature (F): 98 Height (in): 64 Pulse (bpm): 86 Weight (lbs): 142 Respiratory Rate (breaths/min): 15 Body Mass Index (BMI): 24.4 Blood Pressure (mmHg): 130/50 Capillary Blood Glucose (mg/dl): 117 Reference Range: 80 - 120 mg / dl Electronic Signature(s) Signed:  10/16/2019 5:14:05 PM By: [name] EMT/HBOT Entered By: [name] on 10/16/2019 15:14:14

## 2019-10-16 NOTE — Progress Notes (Signed)
KADAJAH, KJOS ([id number]) Visit Report for 10/16/2019 SuperBill Details Patient Name: Date of Service: [name], [name] 10/16/2019 Medical Record Number:[id number] Patient Account Number: [id number] Date of Birth/Sex: Treating RN: [birth year] (79 y.o. F) Primary Care Provider: [name] Other Clinician: [name] Referring Provider: Treating Provider/[name]:Jevaughn Degollado, [name], [name] Weeks in Treatment: 13 Diagnosis Coding ICD-10 Codes Code Description E11.621 Type 2 diabetes mellitus with foot ulcer E11.52 Type 2 diabetes mellitus with diabetic peripheral angiopathy with gangrene L97.518 Non-pressure chronic ulcer of other part of right foot with other specified severity L97.528 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ity E11.42 Type 2 diabetes mellitus with diabetic polyneuropathy 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, 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 Facility Procedures The patient participates with Medicare or their insurance follows the Medicare Facility Guidelines CPT4 Code Description Modifier Quantity [id number]-(Facility Use Only) HBOT, full body chamber, 50min 4 Physician Procedures CPT4 Code Description Modifier Quantity 4401027 25366 - WC PHYS HYPERBARIC OXYGEN THERAPY 1 ICD-10 Diagnosis Description E11.621 Type 2 diabetes mellitus with foot ulcer Electronic Signature(s) Signed: 10/16/2019 5:14:05 PM By: [name] EMT/HBOT Signed: 10/16/2019 5:37:40 PM By: [name] MD Entered By: [name] on 10/16/2019 17:13:01

## 2019-10-17 DIAGNOSIS — Z792 Long term (current) use of antibiotics: Secondary | ICD-10-CM | POA: Diagnosis not present

## 2019-10-17 DIAGNOSIS — Z79899 Other long term (current) drug therapy: Secondary | ICD-10-CM | POA: Diagnosis not present

## 2019-10-17 DIAGNOSIS — Z85828 Personal history of other malignant neoplasm of skin: Secondary | ICD-10-CM | POA: Diagnosis not present

## 2019-10-17 DIAGNOSIS — Z4800 Encounter for change or removal of nonsurgical wound dressing: Secondary | ICD-10-CM | POA: Diagnosis not present

## 2019-10-17 DIAGNOSIS — I10 Essential (primary) hypertension: Secondary | ICD-10-CM | POA: Diagnosis not present

## 2019-10-17 DIAGNOSIS — M1990 Unspecified osteoarthritis, unspecified site: Secondary | ICD-10-CM | POA: Diagnosis not present

## 2019-10-17 DIAGNOSIS — L97528 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ity: Secondary | ICD-10-CM | POA: Diagnosis not present

## 2019-10-17 DIAGNOSIS — E785 Hyperlipidemia, unspecified: Secondary | ICD-10-CM | POA: Diagnosis not present

## 2019-10-17 DIAGNOSIS — E1152 Type 2 diabetes mellitus with diabetic peripheral angiopathy with gangrene: Secondary | ICD-10-CM | POA: Diagnosis not present

## 2019-10-17 DIAGNOSIS — E11621 Type 2 diabetes mellitus with foot ulcer: Secondary | ICD-10-CM | POA: Diagnosis not present

## 2019-10-17 DIAGNOSIS — K219 Gastro-esophageal reflux disease without esophagitis: Secondary | ICD-10-CM | POA: Diagnosis not present

## 2019-10-17 DIAGNOSIS — Z7982 Long term (current) use of aspirin: Secondary | ICD-10-CM | POA: Diagnosis not present

## 2019-10-17 DIAGNOSIS — Z89431 Acquired absence of right foot: Secondary | ICD-10-CM | POA: Diagnosis not present

## 2019-10-17 DIAGNOSIS — Z4781 Encounter for orthopedic aftercare following surgical amputation: Secondary | ICD-10-CM | POA: Diagnosis not present

## 2019-10-17 DIAGNOSIS — Z4801 Encounter for change or removal of surgical wound dressing: Secondary | ICD-10-CM | POA: Diagnosis not present

## 2019-10-17 DIAGNOSIS — D649 Anemia, unspecified: Secondary | ICD-10-CM | POA: Diagnosis not present

## 2019-10-17 DIAGNOSIS — Z7984 Long term (current) use of oral hypoglycemic drugs: Secondary | ICD-10-CM | POA: Diagnosis not present

## 2019-10-17 DIAGNOSIS — Z7902 Long term (current) use of antithrombotics/antiplatelets: Secondary | ICD-10-CM | POA: Diagnosis not present

## 2019-10-17 DIAGNOSIS — Z9181 History of falling: Secondary | ICD-10-CM | POA: Diagnosis not present

## 2019-10-17 DIAGNOSIS — Z452 Encounter for adjustment and management of vascular access device: Secondary | ICD-10-CM | POA: Diagnosis not present

## 2019-10-19 ENCOUNTER — Other Ambulatory Visit: Payer: Self-pay

## 2019-10-19 ENCOUNTER — Encounter (HOSPITAL_BASED_OUTPATIENT_CLINIC_OR_DEPARTMENT_OTHER): Payer: Medicare Other | Admitting: Internal Medicine

## 2019-10-19 DIAGNOSIS — L97512 Non-pressure chronic ulcer of other part of right foot with fat layer exposed: Secondary | ICD-10-CM | POA: Diagnosis not present

## 2019-10-19 DIAGNOSIS — M869 Osteomyelitis, unspecified: Secondary | ICD-10-CM | POA: Diagnosis not present

## 2019-10-19 DIAGNOSIS — L97514 Non-pressure chronic ulcer of other part of right foot with necrosis of bone: Secondary | ICD-10-CM | POA: Diagnosis not present

## 2019-10-19 DIAGNOSIS — E11621 Type 2 diabetes mellitus with foot ulcer: Secondary | ICD-10-CM | POA: Diagnosis not present

## 2019-10-19 DIAGNOSIS — L97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 limited to breakdown of skin: Secondary | ICD-10-CM | POA: Diagnosis not present

## 2019-10-19 DIAGNOSIS — E11319 Type 2 diabetes mellitus with unspecified diabetic retinopathy without macular edema: Secondary | ICD-10-CM | POA: Diagnosis not present

## 2019-10-19 DIAGNOSIS — L97522 Non-pressure chronic ulcer of other part of left foot with fat layer exposed: Secondary | ICD-10-CM | POA: Diagnosis not present

## 2019-10-19 DIAGNOSIS — Z4781 Encounter for orthopedic aftercare following surgical amputation: Secondary | ICD-10-CM | POA: Diagnosis not present

## 2019-10-19 DIAGNOSIS — E1152 Type 2 diabetes mellitus with diabetic peripheral angiopathy with gangrene: Secondary | ICD-10-CM | POA: Diagnosis not present

## 2019-10-19 DIAGNOSIS — L97528 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ity: Secondary | ICD-10-CM | POA: Diagnosis not present

## 2019-10-19 DIAGNOSIS — I10 Essential (primary) hypertension: Secondary | ICD-10-CM | POA: Diagnosis not present

## 2019-10-19 DIAGNOSIS — D649 Anemia, unspecified: Secondary | ICD-10-CM | POA: Diagnosis not present

## 2019-10-19 LAB — GLUCOSE, CAPILLARY
Glucose-Capillary: 128 mg/dL — ABNORMAL HIGH (ref 70–99)
Glucose-Capillary: 186 mg/dL — ABNORMAL HIGH (ref 70–99)

## 2019-10-19 NOTE — Progress Notes (Addendum)
[name], [name] ([id number]) Visit Report for 10/19/2019 HBO Details Patient Name: Date of Service: [name], [name] 10/19/2019 2:00 PM Medical Record Number:[id number] Patient Account Number: [id number] Date of Birth/Sex: Treating RN: [birth year] (79 y.o. F) Primary Care Lakechia Nay: [name] Other Clinician: [name] Referring Rowen Hur: Treating Alaysiah Browder/[name]:[name], [name], [name] Weeks in Treatment: 14 HBO Treatment Course Details Treatment Course Number: 1 Ordering Loanne Emery: [name] Total Treatments Ordered: 40 HBO Treatment Start Date: 10/12/2019 HBO Indication: Diabetic Ulcer(s) of the Lower Extremity HBO Treatment Details Treatment Number: 6 Patient Type: Outpatient Chamber Type: Monoplace Chamber Serial #: [id number] Treatment Protocol: 2.5 ATA with 90 minutes oxygen, with two 5 minute air breaks Treatment Details Compression Rate Down: 2.0 psi / minute De-Compression Rate Up: 2.0 psi / minute Air breaks and CompressTx Pressure breathing periods DecompressDecompress Begins Reached (leave unused spaces Begins Ends blank) Chamber Pressure (ATA)1 2.5 2.5 2.5 2.5 2.5 --2.5 1 Clock Time (24 hr) 13:53 14:05 06:3016:0109:3235:57--32:20 15:57 Treatment Length: 124 (minutes) Treatment Segments: 4 Vital Signs Capillary Blood Glucose Reference Range: 80 - 120 mg / dl HBO Diabetic Blood Glucose Intervention Range: <131 mg/dl or >249 mg/dl Time Vitals Blood Respiratory Capillary Blood Glucose Pulse Action Type: Pulse: Temperature: Taken: Pressure: Rate: Glucose (mg/dl): Meter #: Oximetry (%) Taken: Pre 13:40 157/66 97 14 97.5 128 Post 16:00 139/80 84 15 98 186 Treatment Response Treatment Toleration: Well Treatment Completion Treatment Completed without Adverse Event Status: Additional Procedure Documentation Tissue Sevierity: Necrosis of bone Lissette Schenk Notes no concerns with treatment given Physician HBO Attestation: I certify that I  supervised this HBO treatment in accordance with Medicare guidelines. A trained Yes emergency response team is readily available per hospital policies and procedures. Continue HBOT as ordered. Yes Electronic Signature(s) Signed: 10/20/2019 5:32:11 PM By: [name] MD Previous Signature: 10/19/2019 4:40:42 PM Version By: [name] EMT/HBOT Entered By: [name] on 10/19/2019 19:15:12 -------------------------------------------------------------------------------- HBO Safety Checklist Details Patient Name: Date of Service: [name] 10/19/2019 2:00 PM Medical Record Number:[id number] Patient Account Number: [id number] Date of Birth/Sex: Treating RN: 1941-05-09 (79 y.o. F) Primary Care Janavia Rottman: [name] Other Clinician: [name] Referring Aiko Belko: Treating Mellie Buccellato/[name]:[name], [name], [name] Weeks in Treatment: 14 HBO Safety Checklist Items Safety Checklist Consent Form Signed Patient voided / foley secured and emptied When did you last eato 1200 - oatmeal/boost Last dose of injectable or oral agent metformin NA Ostomy pouch emptied and vented if applicable All implantable devices assessed, documented and approved Intravenous access site secured and place PICC line left arm Valuables secured Linens and cotton and cotton/polyester blend (less than 51% polyester) Personal oil-based products / skin lotions / body lotions removed Wigs or hairpieces removed NA Smoking or tobacco materials removed Books / newspapers / magazines / loose paper removed Cologne, aftershave, perfume and deodorant removed Jewelry removed (may wrap wedding band) Make-up removed Hair care products removed NA Battery operated devices (external) removed NA Heating patches and chemical warmers removed NA Titanium eyewear removed Nail polish cured greater than 10 hours NA Casting material cured greater than 10 hours NA Hearing aids removed NA Loose  dentures or partials removed NA Prosthetics have been removed Patient demonstrates correct use of air break device (if applicable) Patient concerns have been addressed Patient grounding bracelet on and cord attached to chamber Specifics for Inpatients (complete in addition to above) Medication sheet sent with patient Intravenous medications needed or due during therapy sent with patient Drainage tubes (e.g. nasogastric tube or chest tube secured and vented) Endotracheal or  Tracheotomy tube secured Cuff deflated of air and inflated with saline Airway suctioned Electronic Signature(s) Signed: 10/19/2019 2:18:35 PM By: [name] EMT/HBOT Entered By: [name] on 10/19/2019 14:18:34

## 2019-10-19 NOTE — Progress Notes (Signed)
SAYLEE, SHERRILL ([id number]) Visit Report for 10/19/2019 Arrival Information Details Patient Name: Date of Service: CORIE, ALLIS 10/19/2019 2:00 PM Medical Record Number:[id number] Patient Account Number: [id number] Date of Birth/Sex: Treating RN: 01-Oct-1940 (79 y.o. F) Primary Care Dola Lunsford: [name] Other Clinician: [name] Referring Alizzon Dioguardi: Treating Nixon Kolton/[name]:[name], [name], [name] Weeks in Treatment: 14 Visit Information History Since Last Visit Added or deleted any medications: No Patient Arrived: Walker Any new allergies or adverse reactions: No Arrival Time: 13:35 Had a fall or experienced change in No Accompanied By: self activities of daily living that may affect Transfer Assistance: None risk of falls: Patient Identification Verified: Yes Signs or symptoms of abuse/neglect since last No Secondary Verification Process Yes visito Completed: Hospitalized since last visit: No Patient Requires Transmission- No Implantable device outside of the clinic excluding No Based Precautions: cellular tissue based products placed in the center Patient Has Alerts: Yes since last visit: Patient Alerts: Right ABI .62 TBI Pain Present Now: No refused Left ABI .60 TBI .14 Electronic Signature(s) Signed: 10/19/2019 4:40:42 PM By: [name] EMT/HBOT Entered By: [name] on 10/19/2019 14:14:58 -------------------------------------------------------------------------------- Encounter Discharge Information Details Patient Name: Date of Service: [name] 10/19/2019 2:00 PM Medical Record Number:[id number] Patient Account Number: [id number] Date of Birth/Sex: Treating RN: [birth year] (79 y.o. F) Primary Care Colie Josten: [name] Other Clinician: [name] Referring Syndey Jaskolski: Treating Getsemani Lindon/[name]:[name], [name], [name] Weeks in Treatment: 14 Encounter Discharge Information Items Discharge  Condition: Stable Ambulatory Status: Walker Discharge Destination: Home Transportation: Private Auto Accompanied By: self Schedule Follow-up Appointment: Yes Clinical Summary of Care: Patient Declined Electronic Signature(s) Signed: 10/19/2019 4:40:42 PM By: [name] EMT/HBOT Entered By: [name] on 10/19/2019 16:40:13 -------------------------------------------------------------------------------- Patient/Caregiver Education Details Patient Name: Date of Service: [name] 1/25/2021andnbsp2:00 PM Medical Record Patient Account Number: [id number] Number: Treating RN: Date of Birth/Gender: 1941-03-03 (78 y.o. F) Other Clinician: [name] Primary Care Physician: Verna Czech Referring Physician: Physician/[name]: Darl Pikes in Treatment: 14 Education Assessment Education Provided To: Patient Education Topics Provided Hyperbaric Oxygenation: Methods: Explain/Verbal Responses: State content correctly Electronic Signature(s) Signed: 10/19/2019 4:40:42 PM By: [name] EMT/HBOT Entered By: [name] on 10/19/2019 16:39:57 -------------------------------------------------------------------------------- Vitals Details Patient Name: Date of Service: [name] 10/19/2019 2:00 PM Medical Record Number:[id number] Patient Account Number: [id number] Date of Birth/Sex: Treating RN: 1940-12-08 (79 y.o. F) Primary Care Ankita Newcomer: [name] Other Clinician: [name] Referring Nimra Puccinelli: Treating Quientin Jent/[name]:[name], [name], [name] Weeks in Treatment: 14 Vital Signs Time Taken: 13:40 Temperature (F): 97.5 Height (in): 64 Pulse (bpm): 97 Weight (lbs): 142 Respiratory Rate (breaths/min): 14 Body Mass Index (BMI): 24.4 Blood Pressure (mmHg): 157/66 Capillary Blood Glucose (mg/dl): 128 Reference Range: 80 - 120 mg / dl Electronic Signature(s) Signed:  10/19/2019 4:40:42 PM By: [name] EMT/HBOT Entered By: [name] on 10/19/2019 14:15:19

## 2019-10-20 ENCOUNTER — Encounter (HOSPITAL_BASED_OUTPATIENT_CLINIC_OR_DEPARTMENT_OTHER): Payer: Medicare Other | Admitting: Internal Medicine

## 2019-10-20 ENCOUNTER — Other Ambulatory Visit: Payer: Self-pay

## 2019-10-20 DIAGNOSIS — E11319 Type 2 diabetes mellitus with unspecified diabetic retinopathy without macular edema: Secondary | ICD-10-CM | POA: Diagnosis not present

## 2019-10-20 DIAGNOSIS — L97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 limited to breakdown of skin: Secondary | ICD-10-CM | POA: Diagnosis not present

## 2019-10-20 DIAGNOSIS — L97522 Non-pressure chronic ulcer of other part of left foot with fat layer exposed: Secondary | ICD-10-CM | POA: Diagnosis not present

## 2019-10-20 DIAGNOSIS — L97514 Non-pressure chronic ulcer of other part of right foot with necrosis of bone: Secondary | ICD-10-CM | POA: Diagnosis not present

## 2019-10-20 DIAGNOSIS — E11621 Type 2 diabetes mellitus with foot ulcer: Secondary | ICD-10-CM | POA: Diagnosis not present

## 2019-10-20 DIAGNOSIS — E1152 Type 2 diabetes mellitus with diabetic peripheral angiopathy with gangrene: Secondary | ICD-10-CM | POA: Diagnosis not present

## 2019-10-20 DIAGNOSIS — L97512 Non-pressure chronic ulcer of other part of right foot with fat layer exposed: Secondary | ICD-10-CM | POA: Diagnosis not present

## 2019-10-20 LAB — GLUCOSE, CAPILLARY
Glucose-Capillary: 172 mg/dL — ABNORMAL HIGH (ref 70–99)
Glucose-Capillary: 176 mg/dL — ABNORMAL HIGH (ref 70–99)

## 2019-10-20 NOTE — Progress Notes (Signed)
TAKILA, KRONBERG ([id number]) Visit Report for 10/20/2019 Arrival Information Details Patient Name: Date of Service: KEYON, WINNICK 10/20/2019 2:00 PM Medical Record Number:[id number] Patient Account Number: [id number] Date of Birth/Sex: Treating RN: [birth year] (79 y.o. F) Primary Care Audery Wassenaar: [name] Other Clinician: [name] Referring Brock Mokry: Treating Yoshino Broccoli/[name]:[name], [name], [name] Weeks in Treatment: 14 Visit Information History Since Last Visit Added or deleted any medications: No Patient Arrived: Walker Any new allergies or adverse reactions: No Arrival Time: 13:50 Had a fall or experienced change in No Accompanied By: self activities of daily living that may affect Transfer Assistance: None risk of falls: Patient Identification Verified: Yes Signs or symptoms of abuse/neglect since last No Secondary Verification Process Yes visito Completed: Hospitalized since last visit: No Patient Requires Transmission- No Implantable device outside of the clinic excluding No Based Precautions: cellular tissue based products placed in the center Patient Has Alerts: Yes since last visit: Patient Alerts: Right ABI .62 TBI Pain Present Now: No refused Left ABI .60 TBI .14 Electronic Signature(s) Signed: 10/20/2019 5:09:30 PM By: [name] EMT/HBOT Entered By: [name] on 10/20/2019 14:08:18 -------------------------------------------------------------------------------- Encounter Discharge Information Details Patient Name: Date of Service: [name] 10/20/2019 2:00 PM Medical Record Number:[id number] Patient Account Number: [id number] Date of Birth/Sex: Treating RN: [birth year] (79 y.o. F) Primary Care Gilliam Hawkes: [name] Other Clinician: [name] Referring Markies Mowatt: Treating Raekwon Winkowski/[name]:[name], [name], [name] Weeks in Treatment: 14 Encounter Discharge Information Items Discharge  Condition: Stable Ambulatory Status: Walker Discharge Destination: Home Transportation: Private Auto Accompanied By: self Schedule Follow-up Appointment: Yes Clinical Summary of Care: Patient Declined Electronic Signature(s) Signed: 10/20/2019 5:09:30 PM By: [name] EMT/HBOT Entered By: [name] on 10/20/2019 17:09:03 -------------------------------------------------------------------------------- Patient/Caregiver Education Details Patient Name: Date of Service: [name] 1/26/2021andnbsp2:00 PM Medical Record Patient Account Number: [id number] Number: Treating RN: Date of Birth/Gender: Jun 16, 1941 (78 y.o. F) Other Clinician: [name] Primary Care Physician: Verna Czech Referring Physician: Physician/[name]: Darl Pikes in Treatment: 14 Education Assessment Education Provided To: Patient Education Topics Provided Hyperbaric Oxygenation: Methods: Explain/Verbal Responses: State content correctly Electronic Signature(s) Signed: 10/20/2019 5:09:30 PM By: [name] EMT/HBOT Entered By: [name] on 10/20/2019 17:08:47 -------------------------------------------------------------------------------- Vitals Details Patient Name: Date of Service: [name] 10/20/2019 2:00 PM Medical Record Number:[id number] Patient Account Number: [id number] Date of Birth/Sex: Treating RN: 11/15/1940 (79 y.o. F) Primary Care Payge Eppes: [name] Other Clinician: [name] Referring Rashi Granier: Treating Dhanya Bogle/[name]:[name], [name], [name] Weeks in Treatment: 14 Vital Signs Time Taken: 13:55 Temperature (F): 97.8 Height (in): 64 Pulse (bpm): 78 Weight (lbs): 142 Respiratory Rate (breaths/min): 17 Body Mass Index (BMI): 24.4 Blood Pressure (mmHg): 125/49 Capillary Blood Glucose (mg/dl): 172 Reference Range: 80 - 120 mg / dl Electronic Signature(s) Signed:  10/20/2019 5:09:30 PM By: [name] EMT/HBOT Entered By: [name] on 10/20/2019 14:12:38

## 2019-10-20 NOTE — Progress Notes (Signed)
[name], [name] ([id number]) Visit Report for 10/20/2019 SuperBill Details Patient Name: Date of Service: [name], [name] 10/20/2019 Medical Record Number:[id number] Patient Account Number: [id number] Date of Birth/Sex: Treating RN: Jun 13, 1941 (78 y.o. 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 other specified severity L97.528 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ity E11.42 Type 2 diabetes mellitus with diabetic polyneuropathy 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, 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 Facility Procedures The patient participates with Medicare or their insurance follows the Medicare Facility Guidelines CPT4 Code Description Modifier Quantity 83462194 G0277-(Facility Use Only) HBOT, full body chamber, 27min 4 Physician Procedures CPT4 Code Description Modifier Quantity 7125271 29290 - WC PHYS HYPERBARIC OXYGEN THERAPY 1 ICD-10 Diagnosis Description E11.621 Type 2 diabetes mellitus with foot ulcer Electronic Signature(s) Signed: 10/20/2019 5:09:30 PM By: [name] EMT/HBOT Signed: 10/20/2019 5:32:11 PM By: [name] MD Entered By: [name] on 10/20/2019 17:08:33

## 2019-10-20 NOTE — Progress Notes (Signed)
THERSIA, PETRAGLIA ([id number]) Visit Report for 10/19/2019 SuperBill Details Patient Name: Date of Service: [name], [name] 10/19/2019 Medical Record Number:[id number] Patient Account Number: [id number] Date of Birth/Sex: Treating RN: 1941-01-27 (79 y.o. 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, 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 Facility Procedures The patient participates with Medicare or their insurance follows the Medicare Facility Guidelines CPT4 Code Description Modifier Quantity [id number] G0277-(Facility Use Only) HBOT, full body chamber, 20min 4 Physician Procedures CPT4 Code Description Modifier Quantity [id number] 99183 - WC PHYS HYPERBARIC OXYGEN THERAPY 1 ICD-10 Diagnosis Description E11.621 Type 2 diabetes mellitus with foot ulcer Electronic Signature(s) Signed: 10/19/2019 4:40:42 PM By: [name] EMT/HBOT Signed: 10/20/2019 5:32:11 PM By: [name] MD Entered By: [name] on 10/19/2019 16:39:44

## 2019-10-20 NOTE — Progress Notes (Addendum)
[name], [name] ([id number]) Visit Report for 10/20/2019 HBO Details Patient Name: Date of Service: [name], [name] 10/20/2019 2:00 PM Medical Record Number:[id number] Patient Account Number: [id number] Date of Birth/Sex: Treating RN: 12/28/1940 (79 y.o. F) Primary Care [name]: [name] Other Clinician: [name] Referring Taysia Rivere: Treating Dhruv Christina/[name]:[name], [name], [name] Weeks in Treatment: 14 HBO Treatment Course Details Treatment Course Number: 1 Ordering Brittny Spangle: [name] Total Treatments Ordered: 40 HBO Treatment Start Date: 10/12/2019 HBO Indication: Diabetic Ulcer(s) of the Lower Extremity HBO Treatment Details Treatment Number: 7 Patient Type: Outpatient Chamber Type: Monoplace Chamber Serial #: [id number] Treatment Protocol: 2.5 ATA with 90 minutes oxygen, with two 5 minute air breaks Treatment Details Compression Rate Down: 2.0 psi / minute De-Compression Rate Up: 2.0 psi / minute Air breaks and CompressTx Pressure breathing periods DecompressDecompress Begins Reached (leave unused spaces Begins Ends blank) Chamber Pressure (ATA)1 2.5 2.5 2.5 2.5 2.5 --2.5 1 Clock Time (24 hr) 14:11 14:23 [id number]:1245:8099:8338:25--05:39 16:15 Treatment Length: 124 (minutes) Treatment Segments: 4 Vital Signs Capillary Blood Glucose Reference Range: 80 - 120 mg / dl HBO Diabetic Blood Glucose Intervention Range: <131 mg/dl or >249 mg/dl Time Vitals Blood Respiratory Capillary Blood Glucose Pulse Action Type: Pulse: Temperature: Taken: Pressure: Rate: Glucose (mg/dl): Meter #: Oximetry (%) Taken: Pre 13:55 125/49 78 17 97.8 172 Post 16:17 136/63 65 15 98.1 176 Treatment Response Treatment Toleration: Well Treatment Completion Treatment Completed without Adverse Event Status: Additional Procedure Documentation Tissue Sevierity: Necrosis of bone Keghan Mcfarren Notes No concerns with treatment given Physician HBO Attestation: I certify that  I supervised this HBO treatment in accordance with Medicare guidelines. A trained Yes emergency response team is readily available per hospital policies and procedures. Continue HBOT as ordered. Yes Electronic Signature(s) Signed: 10/20/2019 5:32:11 PM By: [name] MD Previous Signature: 10/20/2019 5:09:30 PM Version By: [name] EMT/HBOT Entered By: [name] on 10/20/2019 17:18:11 -------------------------------------------------------------------------------- HBO Safety Checklist Details Patient Name: Date of Service: [name] 10/20/2019 2:00 PM Medical Record Number:[id number] Patient Account Number: [id number] Date of Birth/Sex: Treating RN: 1941-04-08 (79 y.o. F) Primary Care Lenton Gendreau: [name] Other Clinician: [name] Referring Supreme Rybarczyk: Treating Sanja Elizardo/[name]:[name], [name], [name] Weeks in Treatment: 14 HBO Safety Checklist Items Safety Checklist Consent Form Signed Patient voided / foley secured and emptied 1200 - oatmeal and raisins / When did you last eato graham crackers Last dose of injectable or oral agent metformin Ostomy pouch emptied and vented if applicable NA All implantable devices assessed, documented and approved Intravenous access site secured and place PICC line left arm Valuables secured Linens and cotton and cotton/polyester blend (less than 51% polyester) Personal oil-based products / skin lotions / body lotions removed Wigs or hairpieces removed Smoking or tobacco materials removed NA Books / newspapers / magazines / loose paper removed Cologne, aftershave, perfume and deodorant removed Jewelry removed (may wrap wedding band) Make-up removed Hair care products removed Battery operated devices (external) removed NA Heating patches and chemical warmers removed NA Titanium eyewear removed NA Nail polish cured greater than 10 hours Casting material cured greater than 10  hours NA Hearing aids removed NA Loose dentures or partials removed NA Prosthetics have been removed NA Patient demonstrates correct use of air break device (if applicable) Patient concerns have been addressed Patient grounding bracelet on and cord attached to chamber Specifics for Inpatients (complete in addition to above) Medication sheet sent with patient Intravenous medications needed or due during therapy sent with patient Drainage tubes (e.g. nasogastric tube or chest tube  secured and vented) Endotracheal or Tracheotomy tube secured Cuff deflated of air and inflated with saline Airway suctioned Electronic Signature(s) Signed: 10/20/2019 2:13:45 PM By: [name] EMT/HBOT Entered By: [name] on 10/20/2019 14:13:44

## 2019-10-21 ENCOUNTER — Encounter (HOSPITAL_BASED_OUTPATIENT_CLINIC_OR_DEPARTMENT_OTHER): Payer: Medicare Other | Admitting: Physician Assistant

## 2019-10-21 ENCOUNTER — Other Ambulatory Visit: Payer: Self-pay

## 2019-10-21 DIAGNOSIS — D649 Anemia, unspecified: Secondary | ICD-10-CM | POA: Diagnosis not present

## 2019-10-21 DIAGNOSIS — Z4781 Encounter for orthopedic aftercare following surgical amputation: Secondary | ICD-10-CM | POA: Diagnosis not present

## 2019-10-21 DIAGNOSIS — L97512 Non-pressure chronic ulcer of other part of right foot with fat layer exposed: Secondary | ICD-10-CM | POA: Diagnosis not present

## 2019-10-21 DIAGNOSIS — L97528 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ity: Secondary | ICD-10-CM | POA: Diagnosis not present

## 2019-10-21 DIAGNOSIS — E1152 Type 2 diabetes mellitus with diabetic peripheral angiopathy with gangrene: Secondary | ICD-10-CM | POA: Diagnosis not present

## 2019-10-21 DIAGNOSIS — E11319 Type 2 diabetes mellitus with unspecified diabetic retinopathy without macular edema: Secondary | ICD-10-CM | POA: Diagnosis not present

## 2019-10-21 DIAGNOSIS — I10 Essential (primary) hypertension: Secondary | ICD-10-CM | POA: Diagnosis not present

## 2019-10-21 DIAGNOSIS — E11621 Type 2 diabetes mellitus with foot ulcer: Secondary | ICD-10-CM | POA: Diagnosis not present

## 2019-10-21 DIAGNOSIS — L97522 Non-pressure chronic ulcer of other part of left foot with fat layer exposed: Secondary | ICD-10-CM | POA: Diagnosis not present

## 2019-10-21 DIAGNOSIS — L97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 limited to breakdown of skin: Secondary | ICD-10-CM | POA: Diagnosis not present

## 2019-10-21 DIAGNOSIS — L97514 Non-pressure chronic ulcer of other part of right foot with necrosis of bone: Secondary | ICD-10-CM | POA: Diagnosis not present

## 2019-10-21 LAB — GLUCOSE, CAPILLARY: Glucose-Capillary: 157 mg/dL — ABNORMAL HIGH (ref 70–99)

## 2019-10-21 NOTE — Progress Notes (Addendum)
RIAH, KEHOE ([id number]) Visit Report for 10/21/2019 HBO Details Patient Name: Date of Service: [name], [name] 10/21/2019 2:00 PM Medical Record Number:[id number] Patient Account Number: [id number] Date of Birth/Sex: Treating RN: 03-25-1941 (79 y.o. F) Primary Care Kayceon Oki: [name] Other Clinician: [name] Referring Derik Fults: Treating Ladarius Seubert/[name]:Stone III, Alen Bleacher, [name] Weeks in Treatment: 14 HBO Treatment Course Details Treatment Course Number: 1 Ordering Jaxson Keener: [name] Total Treatments Ordered: 40 HBO Treatment Start Date: 10/12/2019 HBO Indication: Diabetic Ulcer(s) of the Lower Extremity HBO Treatment Details Treatment Number: 8 Patient Type: Outpatient Chamber Type: Monoplace Chamber Serial #: [id number] Treatment Protocol: 2.5 ATA with 90 minutes oxygen, with two 5 minute air breaks Treatment Details Compression Rate Down: 2.0 psi / minute De-Compression Rate Up: 2.0 psi / minute Air breaks and CompressTx Pressure breathing periods DecompressDecompress Begins Reached (leave unused spaces Begins Ends blank) Chamber Pressure (ATA)1 2.5 2.5 2.5 2.5 2.5 --2.5 1 Clock Time (24 hr) 14:08 14:20 14:5014:5515:2515:30--16:00 16:12 Treatment Length: 124 (minutes) Treatment Segments: 4 Vital Signs Capillary Blood Glucose Reference Range: 80 - 120 mg / dl HBO Diabetic Blood Glucose Intervention Range: <131 mg/dl or >249 mg/dl Time Vitals Blood Respiratory Capillary Blood Glucose Pulse Action Type: Pulse: Temperature: Taken: Pressure: Rate: Glucose (mg/dl): Meter #: Oximetry (%) Taken: Pre 13:55 119/43 80 14 97.5 138 Post 16:15 134/45 71 15 98 157 Treatment Response Treatment Toleration: Well Treatment Completion Treatment Completed without Adverse Event Status: Additional Procedure Documentation Tissue Sevierity: Necrosis of bone Physician HBO Attestation: I certify that I supervised this HBO treatment in accordance with  Medicare guidelines. A trained Yes Yes emergency response team is readily available per hospital policies and procedures. Continue HBOT as ordered. Yes Electronic Signature(s) Signed: 10/21/2019 5:30:40 PM By: Worthy Keeler PA-C Previous Signature: 10/21/2019 4:33:34 PM Version By: [name] EMT/HBOT Entered By: Worthy Keeler on 10/21/2019 17:30:40 -------------------------------------------------------------------------------- HBO Safety Checklist Details Patient Name: Date of Service: [name], [name] 10/21/2019 2:00 PM Medical Record Number:[id number] Patient Account Number: [id number] Date of Birth/Sex: Treating RN: 1941-07-08 (79 y.o. F) Primary Care Maddox Bratcher: [name] Other Clinician: [name] Referring Brinlyn Cena: Treating Fahima Cifelli/[name]:Stone III, Alen Bleacher, [name] Weeks in Treatment: 14 HBO Safety Checklist Items Safety Checklist Consent Form Signed Patient voided / foley secured and emptied 1200 - baked potato / peas / When did you last eato boost Last dose of injectable or oral agent metformin Ostomy pouch emptied and vented if applicable NA All implantable devices assessed, documented and approved Intravenous access site secured and place PICC line left arm Valuables secured Linens and cotton and cotton/polyester blend (less than 51% polyester) Personal oil-based products / skin lotions / body lotions removed Wigs or hairpieces removed Smoking or tobacco materials removed NA Books / newspapers / magazines / loose paper removed Cologne, aftershave, perfume and deodorant removed Jewelry removed (may wrap wedding band) Make-up removed Hair care products removed Battery operated devices (external) removed NA Heating patches and chemical warmers removed NA Titanium eyewear removed NA Nail polish cured greater than 10 hours Casting material cured greater than 10 hours NA Hearing aids removed NA Loose dentures or partials  removed NA Prosthetics have been removed NA Patient demonstrates correct use of air break device (if applicable) Patient concerns have been addressed Patient grounding bracelet on and cord attached to chamber Specifics for Inpatients (complete in addition to above) Medication sheet sent with patient Intravenous medications needed or due during therapy sent with patient Drainage tubes (e.g. nasogastric tube or chest tube secured and  vented) Endotracheal or Tracheotomy tube secured Cuff deflated of air and inflated with saline Airway suctioned Electronic Signature(s) Signed: 10/21/2019 3:12:03 PM By: [name] EMT/HBOT Entered By: [name] on 10/21/2019 15:12:00

## 2019-10-21 NOTE — Progress Notes (Signed)
[name], KOMATSU ([id number]) Visit Report for 10/21/2019 Arrival Information Details Patient Name: Date of Service: AMENDA, DUCLOS 10/21/2019 2:00 PM Medical Record Number:[id number] Patient Account Number: [id number] Date of Birth/Sex: Treating RN: 12/03/1940 (79 y.o. F) Primary Care Dequavion Follette: [name] Other Clinician: [name] Referring Shihab States: Treating Reneisha Stilley/[name]:Stone III, Alen Bleacher, [name] Weeks in Treatment: 14 Visit Information History Since Last Visit Added or deleted any medications: No Patient Arrived: Walker Any new allergies or adverse reactions: No Arrival Time: 13:50 Had a fall or experienced change in No Accompanied By: self activities of daily living that may affect Transfer Assistance: None risk of falls: Patient Identification Verified: Yes Signs or symptoms of abuse/neglect since last No Secondary Verification Process Yes visito Completed: Hospitalized since last visit: No Patient Requires Transmission- No Implantable device outside of the clinic excluding No Based Precautions: cellular tissue based products placed in the center Patient Has Alerts: Yes since last visit: Patient Alerts: Right ABI .62 TBI Pain Present Now: No refused Left ABI .60 TBI .14 Electronic Signature(s) Signed: 10/21/2019 4:33:34 PM By: [name] EMT/HBOT Entered By: [name] on 10/21/2019 15:10:35 -------------------------------------------------------------------------------- Encounter Discharge Information Details Patient Name: Date of Service: [name] 10/21/2019 2:00 PM Medical Record Number:[id number] Patient Account Number: [id number] Date of Birth/Sex: Treating RN: [birth year] (79 y.o. F) Primary Care Merdith Boyd: [name] Other Clinician: [name] Referring Williette Loewe: Treating Damonie Ellenwood/[name]:Stone III, Alen Bleacher, [name] Weeks in Treatment: 70 Encounter Discharge Information Items Discharge  Condition: Stable Ambulatory Status: Walker Discharge Destination: Home Transportation: Private Auto Accompanied By: self Schedule Follow-up Appointment: Yes Clinical Summary of Care: Patient Declined Electronic Signature(s) Signed: 10/21/2019 4:33:34 PM By: [name] EMT/HBOT Entered By: [name] on 10/21/2019 16:33:05 -------------------------------------------------------------------------------- Patient/Caregiver Education Details Patient Name: Date of Service: [name] 1/27/2021andnbsp2:00 PM Medical Record Patient Account Number: [id number] Number: Treating RN: Date of Birth/Gender: 10/16/1940 (78 y.o. F) Other Clinician: [name] Primary Care Physician: [name] Treating Worthy Keeler Referring Physician: Physician/[name]: Darl Pikes in Treatment: 14 Education Assessment Education Provided To: Patient Education Topics Provided Hyperbaric Oxygenation: Methods: Explain/Verbal Responses: State content correctly Electronic Signature(s) Signed: 10/21/2019 4:33:34 PM By: [name] EMT/HBOT Entered By: [name] on 10/21/2019 16:32:54 -------------------------------------------------------------------------------- Vitals Details Patient Name: Date of Service: [name] 10/21/2019 2:00 PM Medical Record Number:[id number] Patient Account Number: [id number] Date of Birth/Sex: Treating RN: 1941-02-22 (79 y.o. F) Primary Care Tyanne Derocher: [name] Other Clinician: [name] Referring Jayko Voorhees: Treating Keionte Swicegood/[name]:Stone III, Alen Bleacher, [name] Weeks in Treatment: 14 Vital Signs Time Taken: 13:55 Temperature (F): 97.5 Height (in): 64 Pulse (bpm): 80 Weight (lbs): 142 Respiratory Rate (breaths/min): 14 Body Mass Index (BMI): 24.4 Blood Pressure (mmHg): 119/43 Capillary Blood Glucose (mg/dl): 138 Reference Range: 80 - 120 mg / dl Electronic Signature(s) Signed:  10/21/2019 4:33:34 PM By: [name] EMT/HBOT Entered By: [name] on 10/21/2019 15:11:01

## 2019-10-21 NOTE — Progress Notes (Signed)
LARIA, GRIMMETT ([id number]) Visit Report for 10/21/2019 Problem List Details Patient Name: Date of Service: [name], [name] 10/21/2019 2:00 PM Medical Record Number:[id number] Patient Account Number: [id number] Date of Birth/Sex: Treating RN: 10/02/1940 (79 y.o. 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 07/13/2019 No Yes with other specified severity L97.528 Non-pressure chronic ulcer of other part of left foot 07/13/2019 No Yes with other specified severity E11.42 Type 2 diabetes mellitus with diabetic polyneuropathy 07/13/2019 No Yes 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, 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 Facility Procedures The patient participates with Medicare or their insurance follows the Medicare Facility Guidelines: CPT4 Code Description Modifier Quantity [id number] G0277-(Facility Use Only) HBOT, full body chamber, 56min 4 Physician Procedures CPT4 Code Description: [id number] - WC PHYS HYPERBARIC OXYGEN THERAPY ICD-10 Diagnosis Description E11.621 Type 2 diabetes mellitus with foot ulcer Modifier: Quantity: 1 Electronic Signature(s) Signed: 10/21/2019 5:30:46 PM By: Worthy Keeler PA-C Previous Signature: 10/21/2019 4:33:34 PM Version By: [name] EMT/HBOT Entered By: Worthy Keeler on 10/21/2019 17:30:44

## 2019-10-22 ENCOUNTER — Other Ambulatory Visit: Payer: Self-pay

## 2019-10-22 ENCOUNTER — Encounter (HOSPITAL_BASED_OUTPATIENT_CLINIC_OR_DEPARTMENT_OTHER): Payer: Medicare Other | Admitting: Internal Medicine

## 2019-10-22 DIAGNOSIS — L97514 Non-pressure chronic ulcer of other part of right foot with necrosis of bone: Secondary | ICD-10-CM | POA: Diagnosis not present

## 2019-10-22 DIAGNOSIS — L97526 Non-pressure chronic ulcer of other part of left foot with bone involvement without evidence of necrosis: Secondary | ICD-10-CM | POA: Diagnosis not present

## 2019-10-22 DIAGNOSIS — L97512 Non-pressure chronic ulcer of other part of right foot with fat layer exposed: Secondary | ICD-10-CM | POA: Diagnosis not present

## 2019-10-22 DIAGNOSIS — L97522 Non-pressure chronic ulcer of other part of left foot with fat layer exposed: Secondary | ICD-10-CM | POA: Diagnosis not present

## 2019-10-22 DIAGNOSIS — E11319 Type 2 diabetes mellitus with unspecified diabetic retinopathy without macular edema: Secondary | ICD-10-CM | POA: Diagnosis not present

## 2019-10-22 DIAGNOSIS — E11621 Type 2 diabetes mellitus with foot ulcer: Secondary | ICD-10-CM | POA: Diagnosis not present

## 2019-10-22 DIAGNOSIS — L97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 limited to breakdown of skin: Secondary | ICD-10-CM | POA: Diagnosis not present

## 2019-10-22 DIAGNOSIS — E1152 Type 2 diabetes mellitus with diabetic peripheral angiopathy with gangrene: Secondary | ICD-10-CM | POA: Diagnosis not present

## 2019-10-22 LAB — GLUCOSE, CAPILLARY
Glucose-Capillary: 138 mg/dL — ABNORMAL HIGH (ref 70–99)
Glucose-Capillary: 130 mg/dL — ABNORMAL HIGH (ref 70–99)
Glucose-Capillary: 178 mg/dL — ABNORMAL HIGH (ref 70–99)

## 2019-10-22 NOTE — Progress Notes (Addendum)
[name], [name] ([id number]) Visit Report for 10/22/2019 HBO Details Patient Name: Date of Service: [name] 10/22/2019 2:00 PM Medical Record Number:[id number] Patient Account Number: [id number] Date of Birth/Sex: Treating RN: 09-01-1941 (79 y.o. F) Primary Care Blayre Papania: [name] Other Clinician: [name] Referring [name]: Treating Ipek Westra/[name]:[name], [name], [name] Weeks in Treatment: 14 HBO Treatment Course Details Treatment Course Number: 1 Ordering Lashaya Kienitz: [name] Total Treatments Ordered: 40 HBO Treatment Start Date: 10/12/2019 HBO Indication: Diabetic Ulcer(s) of the Lower Extremity HBO Treatment Details Treatment Number: 9 Patient Type: Outpatient Chamber Type: Monoplace Chamber Serial #: [id number] Treatment Protocol: 2.5 ATA with 90 minutes oxygen, with two 5 minute air breaks Treatment Details Compression Rate Down: 2.0 psi / minute De-Compression Rate Up: 2.0 psi / minute Air breaks and CompressTx Pressure breathing periods DecompressDecompress Begins Reached (leave unused spaces Begins Ends blank) Chamber Pressure (ATA)1 2.5 2.5 2.5 2.5 2.5 --2.5 1 Clock Time (24 hr) 15:10 15:22 84:1660:6301:6010:93--23:55 17:14 Treatment Length: 124 (minutes) Treatment Segments: 4 Vital Signs Capillary Blood Glucose Reference Range: 80 - 120 mg / dl HBO Diabetic Blood Glucose Intervention Range: <131 mg/dl or >249 mg/dl Time Vitals Blood Respiratory Capillary Blood Glucose Pulse Action Type: Pulse: Temperature: Taken: Pressure: Rate: Glucose (mg/dl): Meter #: Oximetry (%) Taken: Pre 14:20 126/53 79 15 98.2 130 Post 17:17 113/76 70 76 98.4 178 Treatment Response Treatment Toleration: Well Treatment Completion Treatment Completed without Adverse Event Status: Additional Procedure Documentation Tissue Sevierity: Necrosis of bone Kanye Depree Notes The patient was also seen for wound care evaluation. Otherwise she tolerated  her treatment well Physician HBO Attestation: I certify that I supervised this HBO treatment in accordance with Medicare guidelines. A trained Yes emergency response team is readily available per hospital policies and procedures. Continue HBOT as ordered. Yes Electronic Signature(s) Signed: 10/22/2019 5:54:40 PM By: [name] MD Previous Signature: 10/22/2019 5:34:21 PM Version By: [name] EMT/HBOT Entered By: [name] on 10/22/2019 17:52:56 -------------------------------------------------------------------------------- HBO Safety Checklist Details Patient Name: Date of Service: [name] 10/22/2019 2:00 PM Medical Record Number:[id number] Patient Account Number: [id number] Date of Birth/Sex: Treating RN: 10/02/1940 (79 y.o. F) Primary Care Oakes Mccready: [name] Other Clinician: [name] Referring Aryah Doering: Treating Masahiro Iglesia/[name]:[name], [name], [name] Weeks in Treatment: 14 HBO Safety Checklist Items Safety Checklist Consent Form Signed Patient voided / foley secured and emptied When did you last eato 1200 - PBandJ sammy/Boost Last dose of injectable or oral agent metformin NA Ostomy pouch emptied and vented if applicable All implantable devices assessed, documented and approved Intravenous access site secured and place PICC line left arm Valuables secured Linens and cotton and cotton/polyester blend (less than 51% polyester) Personal oil-based products / skin lotions / body lotions removed Wigs or hairpieces removed NA Smoking or tobacco materials removed Books / newspapers / magazines / loose paper removed Cologne, aftershave, perfume and deodorant removed Jewelry removed (may wrap wedding band) Make-up removed Hair care products removed NA Battery operated devices (external) removed NA Heating patches and chemical warmers removed NA Titanium eyewear removed Nail polish cured greater than 10 hours NA Casting  material cured greater than 10 hours NA Hearing aids removed NA Loose dentures or partials removed NA Prosthetics have been removed Patient demonstrates correct use of air break device (if applicable) Patient concerns have been addressed Patient grounding bracelet on and cord attached to chamber Specifics for Inpatients (complete in addition to above) Medication sheet sent with patient Intravenous medications needed or due during therapy sent with patient Drainage tubes (  e.g. nasogastric tube or chest tube secured and vented) Endotracheal or Tracheotomy tube secured Cuff deflated of air and inflated with saline Airway suctioned Electronic Signature(s) Signed: 10/22/2019 3:55:51 PM By: [name] EMT/HBOT Entered By: [name] on 10/22/2019 15:55:50

## 2019-10-22 NOTE — Progress Notes (Signed)
[name], [name] ([id number]) Visit Report for 10/22/2019 SuperBill Details Patient Name: Date of Service: [name], [name] 10/22/2019 Medical Record Number:[id number] Patient Account Number: [id number] Date of Birth/Sex: Treating RN: April 23, 1941 (79 y.o. 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, 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 Facility Procedures The patient participates with Medicare or their insurance follows the Medicare Facility Guidelines CPT4 Code Description Modifier Quantity [id number] G0277-(Facility Use Only) HBOT, full body chamber, 90min 4 Physician Procedures CPT4 Code Description Modifier Quantity [id number] 03013 - WC PHYS HYPERBARIC OXYGEN THERAPY 1 ICD-10 Diagnosis Description E11.621 Type 2 diabetes mellitus with foot ulcer Electronic Signature(s) Signed: 10/22/2019 5:34:21 PM By: [name] EMT/HBOT Signed: 10/22/2019 5:54:40 PM By: [name] MD Entered By: [name] on 10/22/2019 17:33:31

## 2019-10-22 NOTE — Progress Notes (Addendum)
[name] ([id number]) Visit Report for 10/22/2019 Arrival Information Details Patient Name: Date of Service: [name], [name] 10/22/2019 4:00 PM Medical Record Number:[id number] Patient Account Number: [id number] Date of Birth/Sex: Treating RN: Oct 05, 1940 (79 y.o. [name] Primary Care Tabatha Razzano: [name] Other Clinician: Referring Lavanda Nevels: Treating Suleman Gunning/[name]:[name], [name], [name] Weeks in Treatment: 14 Visit Information History Since Last Visit Added or deleted any medications: No Patient Arrived: Walker Any new allergies or adverse reactions: No Arrival Time: 14:20 Had a fall or experienced change in No Accompanied By: self activities of daily living that may affect Transfer Assistance: None risk of falls: Patient Identification Verified: Yes Signs or symptoms of abuse/neglect since last No Secondary Verification Process Yes visito Completed: Hospitalized since last visit: No Patient Requires Transmission- No Implantable device outside of the clinic excluding No Based Precautions: cellular tissue based products placed in the center Patient Has Alerts: Yes since last visit: Patient Alerts: Right ABI .35 TBI Has Dressing in Place as Prescribed: Yes refused Pain Present Now: No Left ABI .60 TBI .14 Electronic Signature(s) Signed: 10/22/2019 6:05:55 PM By: Deon Pilling Entered By: Deon Pilling on 10/22/2019 14:39:44 -------------------------------------------------------------------------------- Complex / Palliative Patient Assessment Details Patient Name: Date of Service: [name] 10/22/2019 4:00 PM Medical Record Number:[id number] Patient Account Number: [id number] Date of Birth/Sex: Treating RN: 04/09/1941 (79 y.o. [name] Primary Care Jaxden Blyden: [name] Other Clinician: Referring Roxy Filler: Treating Ivannah Zody/[name]:[name], [name], [name] Weeks in Treatment: 14 Palliative  Management Criteria Complex Wound Management Criteria Patient has remarkable or complex co-morbidities requiring medications or treatments that extend wound healing times. Examples: Diabetes mellitus with chronic renal failure or end stage renal disease requiring dialysis Advanced or poorly controlled rheumatoid arthritis Diabetes mellitus and end stage chronic obstructive pulmonary disease Active cancer with current chemo- or radiation therapy Type 2 Diabetes, Osteomyelitis, PAD Care Approach Wound Care Plan: Complex Wound Management Electronic Signature(s) Signed: 10/23/2019 5:55:42 PM By: [name] MD Signed: 10/26/2019 6:39:44 PM By: [name] RN, BSN Entered By: [name] on 10/23/2019 09:49:48 -------------------------------------------------------------------------------- Encounter Discharge Information Details Patient Name: Date of Service: [name] 10/22/2019 4:00 PM Medical Record Number:[id number] Patient Account Number: [id number] Date of Birth/Sex: Treating RN: Jul 08, 1941 (79 y.o. [name] Primary Care Raeley Gilmore: [name] Other Clinician: Referring Chantay Whitelock: Treating Atalia Litzinger/[name]:[name], [name], [name] Encounter Discharge Information Items Post Procedure Vitals Discharge Condition: Stable Temperature (F): 98 Ambulatory Status: Walker Pulse (bpm): 79 Discharge Destination: Home Respiratory Rate (breaths/min): 15 Transportation: Private Auto Blood Pressure (mmHg): 126/53 Accompanied By: alone Schedule Follow-up Appointment: Yes Clinical Summary of Care: Electronic Signature(s) Signed: 10/26/2019 6:39:44 PM By: [name] RN, BSN Entered By: [name] on 10/23/2019 09:48:46 -------------------------------------------------------------------------------- Lower Extremity Assessment Details Patient Name: Date of Service: [name] 10/22/2019 4:00 PM Medical Record  Number:[id number] Patient Account Number: [id number] Date of Birth/Sex: Treating RN: 08-31-1941 (79 y.o. [name] Primary Care Rhyanna Sorce: [name] Other Clinician: Referring Axle Parfait: Treating Princetta Uplinger/[name]:[name], [name], [name] Weeks in Treatment: 14 Edema Assessment Assessed: [Left: Yes] [Right: Yes] Edema: [Left: No] [Right: No] Calf Left: Right: Point of Measurement: 39 cm From Medial Instep 30 cm 29 cm Ankle Left: Right: Point of Measurement: 10 cm From Medial Instep 19 cm 19 cm Electronic Signature(s) Signed: 10/22/2019 6:05:55 PM By: Deon Pilling Entered By: Deon Pilling on 10/22/2019 14:40:36 -------------------------------------------------------------------------------- Multi Wound Chart Details Patient Name: Date of Service: [name] 10/22/2019 4:00 PM Medical Record Number:[id number] Patient Account Number: [id number] Date  of Birth/Sex: Treating RN: [birth year] (79 y.o. F) Primary Care [name]: [name] Other Clinician: Referring Azalie Harbeck: Treating Arria Naim/[name]:[name], [name], [name] Weeks in Treatment: 14 Vital Signs Height(in): 64 Capillary Blood 130 Glucose(mg/dl): Weight(lbs): 142 Pulse(bpm): 23 Body Mass Index(BMI): 24 Blood Pressure(mmHg): 126/53 Temperature(F): 98 Respiratory 15 Rate(breaths/min): Photos: [1:No Photos] [10:No Photos] [7:No Photos] Wound Location: [1:Left Toe Fourth - Dorsal] [10:Right Amputation Site - Transmetatarsal - Lateral Transmetatarsal] [7:Right Amputation Site -] Wounding Event: [1:Blister] [10:Surgical Injury] [7:Surgical Injury] 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] [10:Lower Extremity] [7:Lower Extremity] Secondary Etiology: [1:Arterial Insufficiency Ulcer N/A] [7:N/A] Comorbid History: [1:Deep Vein Thrombosis, Hypertension, Peripheral Hypertension, Peripheral  Hypertension, Peripheral Arterial Disease, Type II Diabetes] [10:Deep Vein Thrombosis, Arterial Disease, Type II Arterial Disease, Type II Diabetes] [7:Deep Vein  Thrombosis, Diabetes] Date Acquired: [1:09/16/2018] [10:10/16/2019] [7:08/07/2019] Weeks of Treatment: [1:14] [10:0] [7:8] Wound Status: [1:Open] [10:Open] [7:Open] Measurements L x W x D 0.7x0.4x0.4 [10:0.5x0.6x0.2] [7:1.1x2x1] (cm) Area (cm) : [1:0.22] [10:0.236] [7:1.728] Volume (cm) : [1:0.088] [10:0.047] [7:1.728] % Reduction in Area: [1:68.90%] [10:-43.00%] [7:83.30%] % Reduction in Volume: [1:-23.90%] [10:-42.40%] [7:-66.60%] Starting Position 1 [7:6] (o'clock): Ending Position 1 [7:9] (o'clock): Maximum Distance 1 [7:0.3] (cm): Undermining: [1:No] [10:No] [7:Yes] Classification: [1:Grade 2] [10:Grade 3] [7:Grade 4] Exudate Amount: [1:Small] [10:Small] [7:Medium] Exudate Type: [1:Serosanguineous] [10:Serosanguineous] [7:Serosanguineous] Exudate Color: [1:red, brown] [10:red, brown] [7:red, brown] Wound Margin: [1:Distinct, outline attached] [10:Distinct, outline attached] [7:Well defined, not attached] Granulation Amount: [1:Large (67-100%)] [10:Large (67-100%)] [7:Large (67-100%)] Granulation Quality: [1:Red] [10:Red] [7:Red] Necrotic Amount: [1:Small (1-33%)] [10:Small (1-33%)] [7:Small (1-33%)] Necrotic Tissue: [1:Adherent Slough] [10:Adherent Slough] [7:Adherent Slough] Exposed Structures: [1:Fat Layer (Subcutaneous Tissue) Exposed: Yes Bone: Yes Fascia: No Tendon: No Muscle: No Joint: No] [10:Fat Layer (Subcutaneous Tissue) Exposed: Yes Fascia: No Tendon: No Muscle: No Joint: No Bone: No] [7:Fat Layer (Subcutaneous Tissue) Exposed: Yes  Fascia: No Tendon: No Muscle: No Joint: No Bone: No] Epithelialization: [1:Small (1-33%)] [10:None] [7:None] Debridement: [1:Debridement - Excisional] [10:N/A] [7:N/A] Pre-procedure [1:14:45] [10:N/A] [7:N/A] Verification/Time Out Taken: Pain Control: [1:Other] [10:N/A]  [7:N/A] Tissue Debrided: [1:Bone, Subcutaneous, Slough] [10:N/A] [7:N/A] Level: [1:Skin/Subcutaneous Tissue/Muscle/Bone] [10:N/A] [7:N/A] Debridement Area (sq cm):0.28 [10:N/A] [7:N/A] Instrument: [1:Curette] [10:N/A] [7:N/A] Bleeding: [1:Minimum] [10:N/A] [7:N/A] Hemostasis Achieved: [1:Pressure] [10:N/A] [7:N/A] Procedural Pain: [1:0] [10:N/A] [7:N/A] Post Procedural Pain: [1:0] [10:N/A] [7:N/A] Debridement Treatment Procedure was tolerated [10:N/A] [7:N/A] Response: [1:well] Post Debridement [1:0.7x0.4x0.4] [10:N/A] [7:N/A] Measurements L x W x D (cm) Post Debridement [1:0.088] [10:N/A] [7:N/A] Volume: (cm) Procedures Performed: Debridement [1:8] [10:N/A] [7:N/A 9] Photos: [1:No Photos] [10:No Photos] [7:N/A] Wound Location: [1:Left Toe Third] [10:Left Achilles] [7:N/A] Wounding Event: [1:Gradually Appeared] [10:Trauma] [7:N/A] Primary Etiology: [1:Diabetic Wound/Ulcer of the Diabetic Wound/Ulcer of the N/A Lower Extremity] [10:Lower Extremity] Secondary Etiology: [1:N/A] [10:N/A] [7:N/A] Comorbid History: [1:Deep Vein Thrombosis, Hypertension, Peripheral Hypertension, Peripheral Arterial Disease, Type II Diabetes] [10:Deep Vein Thrombosis, Arterial Disease, Type II Diabetes] [7:N/A] Date Acquired: [1:09/10/2019] [10:10/15/2019] [7:N/A] Weeks of Treatment: [1:6] [10:0] [7:N/A] Wound Status: [1:Open] [10:Open] [7:N/A] Measurements L x W x D 0.6x0.8x0.4 [10:0.3x0.2x0.1] [7:N/A] (cm) Area (cm) : [1:0.377] [10:0.047] [7:N/A] Volume (cm) : [1:0.151] [10:0.005] [7:N/A] % Reduction in Area: [1:14.30%] [10:76.00%] [7:N/A] % Reduction in Volume: -243.20% [10:75.00%] [7:N/A] Undermining: [1:No] [10:No] [7:N/A] Classification: [1:Grade 2] [10:Grade 1] [7:N/A] Exudate Amount: [1:Small] [10:Small] [7:N/A] Exudate Type: [1:Serosanguineous] [10:Serous] [7:N/A] Exudate Color: [1:red, brown] [10:amber] [7:N/A] Wound Margin: [1:Distinct, outline attached] [10:Flat and Intact]  [7:N/A] Granulation Amount: [1:None Present (0%)] [10:Large (67-100%)] [7:N/A] Granulation Quality: [1:N/A] [10:Pink] [7:N/A] Necrotic Amount: [1:Large (  67-100%)] [10:None Present (0%)] [7:N/A] Necrotic Tissue: [1:Eschar] [10:N/A] [7:N/A] Exposed Structures: [1:Fat Layer (Subcutaneous Tissue) Exposed: Yes Fascia: No Tendon: No Muscle: No Joint: No Bone: No] [10:Fascia: No Fat Layer (Subcutaneous Tissue) Exposed: No Tendon: No Muscle: No Joint: No Bone: No Limited to Skin Breakdown] [7:N/A] Epithelialization: [1:None] [10:Small (1-33%)] [7:N/A] Debridement: [1:Debridement - Excisional] [10:N/A] [7:N/A] Pre-procedure [1:14:45] [10:N/A] [7:N/A] Verification/Time Out Taken: Pain Control: [1:Other] [10:N/A] [7:N/A] Tissue Debrided: [1:Bone, Subcutaneous, Slough] [10:N/A] [7:N/A] Level: [1:Skin/Subcutaneous Tissue/Muscle/Bone] [10:N/A] [7:N/A] Debridement Area (sq cm):0.48 [10:N/A] [7:N/A] Instrument: [1:Curette] [10:N/A] [7:N/A] Bleeding: [1:Minimum] [10:N/A] [7:N/A] Hemostasis Achieved: [1:Pressure] [10:N/A] [7:N/A] Procedural Pain: [1:0] [10:N/A] [7:N/A] Post Procedural Pain: [1:0] [10:N/A] [7:N/A] Debridement Treatment Procedure was tolerated [10:N/A] [7:N/A] Response: [1:well] Post Debridement [1:0.6x0.8x0.4] [10:N/A] [7:N/A] Measurements L x W x D (cm) Post Debridement [1:0.151] [10:N/A] [7:N/A] Volume: (cm) Procedures Performed: Debridement [10:N/A] [7:N/A] Treatment Notes Electronic Signature(s) Signed: 10/22/2019 5:54:40 PM By: [name] MD Entered By: [name] on 10/22/2019 15:05:03 -------------------------------------------------------------------------------- Multi-Disciplinary Care Plan Details Patient Name: Date of Service: [name] 10/22/2019 4:00 PM Medical Record Number:[id number] Patient Account Number: [id number] Date of Birth/Sex: Treating RN: [birth year] (79 y.o. 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 Potential for barotraumas to ears, sinuses, teeth, and lungs or cerebral gas embolism related to changes in atmospheric pressure inside hyperbaric oxygen chamber Goals: Barotrauma will be prevented during HBO2 Date Initiated: 10/01/2019 Target Resolution Date: 10/30/2019 Goal Status: Active Patient and/or family will be able to state/discuss factors appropriate to the management of their disease process during treatment Date Initiated: 10/01/2019 Target Resolution Date: 10/31/2019 Goal Status: Active Patient/caregiver will verbalize understanding of HBO goals, rationale, procedures and potential hazards Date Initiated: 10/01/2019 Date Inactivated: 10/22/2019 Target Resolution Date: 10/30/2019 Goal Status: Met Interventions: Assess and provide for patients comfort related to the hyperbaric environment and equalization of middle ear Assess patient for any history of confinement anxiety Notes: Abuse / Safety / Falls / Self Care Management Nursing Diagnoses: Potential for falls Goals: Patient will remain injury free related to falls Date Initiated: 08/27/2019 Target Resolution Date: 10/30/2019 Goal Status: Active Interventions: Provide education on fall prevention Notes: Pain, Acute or Chronic Nursing Diagnoses: Pain, acute or chronic: actual or potential Potential alteration in comfort, pain Goals: Patient will verbalize adequate pain control and receive pain control interventions during procedures as needed Date Initiated: 08/27/2019 Target Resolution Date: 10/30/2019 Goal Status: Active Interventions: Encourage patient to take pain medications as prescribed Provide education on pain management Reposition patient for comfort Notes: Electronic  Signature(s) Signed: 10/22/2019 6:05:55 PM By: Deon Pilling Entered By: Deon Pilling on 10/22/2019 14:45:37 -------------------------------------------------------------------------------- Pain Assessment Details Patient Name: Date of Service: JADENCE, KINLAW 10/22/2019 4:00 PM Medical Record Number:[id number] Patient Account Number: [id number] Date of Birth/Sex: Treating RN: [birth year] (79 y.o. [name] Primary Care Nakeeta Sebastiani: [name] Other Clinician: Referring Jad Johansson: Treating Manases Etchison/[name]:[name], [name], [name] Weeks in Treatment: 14 Active Problems Location of Pain Severity and Description of Pain Patient Has Paino No Site Locations Rate the pain. Current Pain Level: 0 Pain Management and Medication Current Pain Management: Medication: No Cold Application: No Rest: No Massage: No Activity: No T.E.N.S.: No Heat Application: No Leg drop or elevation: No Is the Current Pain Management Adequate: Adequate How does your wound impact your activities of daily livingo Sleep: No Bathing: No Appetite:  No Relationship With Others: No Bladder Continence: No Emotions: No Bowel Continence: No Work: No Toileting: No Drive: No Dressing: No Hobbies: No Electronic Signature(s) Signed: 10/22/2019 6:05:55 PM By: Deon Pilling Entered By: Deon Pilling on 10/22/2019 14:40:30 -------------------------------------------------------------------------------- Patient/Caregiver Education Details Patient Name: Date of Service: Traister, Zehra H. 1/28/2021andnbsp4:00 PM Medical Record Number:[id number] Patient Account Number: [id number] Date of Birth/Gender: Treating RN: June 20, 1941 (79 y.o. [name] Primary Care Physician: [name] Other Clinician: Referring Physician: Treating Physician/[name]:[name], [name], [name] in Treatment: 14 Education Assessment Education Provided To: Patient Education Topics  Provided Pain: Handouts: A Guide to Pain Control Methods: Explain/Verbal Responses: Reinforcements needed Electronic Signature(s) Signed: 10/22/2019 6:05:55 PM By: Deon Pilling Entered By: Deon Pilling on 10/22/2019 14:45:53 -------------------------------------------------------------------------------- Wound Assessment Details Patient Name: Date of Service: SHANINE, KREIGER 10/22/2019 4:00 PM Medical Record Number:[id number] Patient Account Number: [id number] Date of Birth/Sex: Treating RN: 07/27/1941 (79 y.o. F) Primary Care Shonice Wrisley: [name] Other Clinician: Referring Jeanny Rymer: Treating Shiro Ellerman/[name]:[name], [name], [name] Weeks in Treatment: 14 Wound Status Wound Number: 1 Primary Diabetic Wound/Ulcer of the Lower Extremity Etiology: Wound Location: Left Toe Fourth - Dorsal Secondary Arterial Insufficiency Ulcer Wounding Event: Blister Etiology: Date Acquired: 09/16/2018 Wound Open Weeks Of Treatment: 14 Status: Clustered Wound: No Comorbid Deep Vein Thrombosis, Hypertension, History: Peripheral Arterial Disease, Type II Diabetes Photos Wound Measurements Length: (cm) 0.7 % Reduction i Width: (cm) 0.4 % Reduction i Depth: (cm) 0.4 Epithelializa Area: (cm) 0.22 Tunneling: Volume: (cm) 0.088 Undermining: Wound Description Classification: Grade 2 Foul Odor Aft Wound Margin: Distinct, outline attached Slough/Fibrin Exudate Amount: Small Exudate Type: Serosanguineous Exudate Color: red, brown Wound Bed Granulation Amount: Large (67-100%) Granulation Quality: Red Fascia Expose Necrotic Amount: Small (1-33%) Fat Layer (Su Necrotic Quality: Adherent Slough Tendon Expose Muscle Expose Joint Exposed Bone Exposed: er Cleansing: No o Yes Exposed Structure d: No bcutaneous Tissue) Exposed: Yes d: No d: No : No Yes n Area: 68.9% n Volume: -23.9% tion: Small (1-33%) No No Treatment Notes Wound #1 (Left, Dorsal Toe Fourth) 1.  Cleanse With Wound Cleanser 3. Primary Dressing Applied [employer] 4. Secondary Dressing Dry Gauze Roll Gauze 5. Secured With Tape Other (specify in notes) Notes foam border on achilles Electronic Signature(s) Signed: 11/03/2019 4:28:10 PM By: [name] EMT/HBOT Previous Signature: 10/22/2019 6:05:55 PM Version By: Deon Pilling Entered By: [name] on 11/03/2019 14:12:58 -------------------------------------------------------------------------------- Wound Assessment Details Patient Name: Date of Service: [name] 10/22/2019 4:00 PM Medical Record Number:[id number] Patient Account Number: [id number] Date of Birth/Sex: Treating RN: 1941/03/20 (79 y.o. F) Primary Care Jaylend Reiland: [name] Other Clinician: Referring Oracio Galen: Treating Nyron Mozer/[name]:[name], [name], [name] Weeks in Treatment: 14 Wound Status Wound Number: 10 Primary Diabetic Wound/Ulcer of the Lower Extremity Etiology: Wound Location: Right Amputation Site - Transmetatarsal - Lateral Wound Open Status: Wounding Event: Surgical Injury Comorbid Deep Vein Thrombosis, Hypertension, Date Acquired: 10/16/2019 History: Peripheral Arterial Disease, Type II Diabetes Weeks Of Treatment: 0 Clustered Wound: No Photos Wound Measurements Length: (cm) 0.5 % Reducti Width: (cm) 0.6 % Reducti Depth: (cm) 0.2 Epithelia Area: (cm) 0.236 Tunnelin Volume: (cm) 0.047 Undermin Wound Description Classification: Grade 3 Wound Margin: Distinct, outline attached Exudate Amount: Small Exudate Type: Serosanguineous Exudate Color: red, brown Wound Bed Granulation Amount: Large (67-100%) Granulation Quality: Red Necrotic Amount: Small (1-33%) Necrotic Quality: Adherent Slough Foul Odor After Cleansing: No Slough/Fibrino Yes Exposed Structure Fascia Exposed: No Fat Layer (Subcutaneous Tissue) Exposed: Yes Tendon Exposed: No Muscle Exposed: No Joint Exposed: No Bone Exposed:  No  on in Area: -43% on in Volume: -42.4% lization: None g: No ing: No Treatment Notes Wound #10 (Right, Lateral Amputation Site - Transmetatarsal) 1. Cleanse With Wound Cleanser 3. Primary Dressing Applied [employer] 4. Secondary Dressing Dry Gauze Roll Gauze 5. Secured With Tape Other (specify in notes) Notes foam border on achilles Electronic Signature(s) Signed: 11/03/2019 4:28:10 PM By: [name] EMT/HBOT Previous Signature: 10/22/2019 6:05:55 PM Version By: Deon Pilling Entered By: [name] on 11/03/2019 14:08:03 -------------------------------------------------------------------------------- Wound Assessment Details Patient Name: Date of Service: [name] 10/22/2019 4:00 PM Medical Record Number:[id number] Patient Account Number: [id number] Date of Birth/Sex: Treating RN: [birth year] (79 y.o. F) Primary Care Chyler Creely: [name] Other Clinician: Referring Esai Stecklein: Treating Janise Gora/[name]:[name], [name], [name] Weeks in Treatment: 14 Wound Status Wound Number: 7 Primary Diabetic Wound/Ulcer of the Lower Extremity Etiology: Wound Location: Right Amputation Site - Transmetatarsal Wound Open Status: Wounding Event: Surgical Injury Comorbid Deep Vein Thrombosis, Hypertension, Date Acquired: 08/07/2019 History: Peripheral Arterial Disease, Type II Diabetes Weeks Of Treatment: 8 Clustered Wound: No Photos Wound Measurements Length: (cm) 1.1 Width: (cm) 2 Depth: (cm) 1 Area: (cm) 1.728 Volume: (cm) 1.728 % Reduction in Area: 83.3% % Reduction in Volume: -66.6% Epithelialization: None Tunneling: No Undermining: Yes Starting Position (o'clock): 6 Ending Position (o'clock): 9 Maximum Distance: (cm) 0.3 Wound Description Classification: Grade 4 Wound Margin: Well defined, not attached Exudate Amount: Medium Exudate Type: Serosanguineous Exudate Color: red, brown Wound Bed Granulation Amount: Large  (67-100%) Granulation Quality: Red Necrotic Amount: Small (1-33%) Necrotic Quality: Adherent Slough Foul Odor After Cleansing: No Slough/Fibrino Yes Exposed Structure Fascia Exposed: No Fat Layer (Subcutaneous Tissue) Exposed: Yes Tendon Exposed: No Muscle Exposed: No Joint Exposed: No Bone Exposed: No Treatment Notes 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 Cleanse With Wound Cleanser 3. Primary Dressing Applied [employer] 4. Secondary Dressing Dry Gauze Roll Gauze 5. Secured With Tape Other (specify in notes) Notes foam border on achilles Electronic Signature(s) Signed: 11/03/2019 4:28:10 PM By: [name] EMT/HBOT Previous Signature: 10/22/2019 6:05:55 PM Version By: Deon Pilling Previous Signature: 10/22/2019 6:05:55 PM Version By: Deon Pilling Entered By: [name] on 11/03/2019 14:14:31 -------------------------------------------------------------------------------- Wound Assessment Details Patient Name: Date of Service: [name] 10/22/2019 4:00 PM Medical Record Number:[id number] Patient Account Number: [id number] Date of Birth/Sex: Treating RN: 1940/10/07 (79 y.o. F) Primary Care Idalys Konecny: [name] Other Clinician: Referring Lindell Renfrew: Treating Blakely Maranan/[name]:[name], [name], [name] Weeks in Treatment: 14 Wound Status Wound Number: 8 Primary Diabetic Wound/Ulcer of the Lower Extremity Etiology: Wound Location: Left Toe Third Wound Open Wounding Event: Gradually Appeared Status: Date Acquired: 09/10/2019 Comorbid Deep Vein Thrombosis, Hypertension, Weeks Of Treatment: 6 History: Peripheral Arterial Disease, Type II Diabetes Clustered Wound: No Photos Wound Measurements Length: (cm) 0.6 % Reducti Width: (cm) 0.8 % Reducti Depth: (cm) 0.4 Epithelia Area: (cm) 0.377 Tunnelin Volume: (cm) 0.151 Undermin Wound Description Classification: Grade 2 Wound Margin: Distinct, outline attached Exudate  Amount: Small Exudate Type: Serosanguineous Exudate Color: red, brown Wound Bed Granulation Amount: None Present (0%) Necrotic Amount: Large (67-100%) Necrotic Quality: Eschar Treatment Notes Wound #8 (Left Toe Third) 1. Cleanse With Wound Cleanser 3. Primary Dressing Applied [employer] 4. Secondary Dressing Dry Gauze Roll Gauze 5. Secured With Tape Other (specify in notes) Foul Odor After Cleansing: No Slough/Fibrino Yes Exposed Structure Fascia Exposed: No Fat Layer (Subcutaneous Tissue) Exposed: Yes Tendon Exposed: No Muscle Exposed: No Joint Exposed: No Bone Exposed: No on in Area: 14.3% on in Volume: -243.2% lization: None g: No  ing: No Notes foam border on achilles Electronic Signature(s) Signed: 11/03/2019 4:28:10 PM By: [name] EMT/HBOT Previous Signature: 10/22/2019 6:05:55 PM Version By: Deon Pilling Entered By: [name] on 11/03/2019 14:02:34 -------------------------------------------------------------------------------- Wound Assessment Details Patient Name: Date of Service: [name] 10/22/2019 4:00 PM Medical Record Number:[id number] Patient Account Number: [id number] Date of Birth/Sex: Treating RN: 1941-09-04 (79 y.o. F) Primary Care Estie Sproule: [name] Other Clinician: Referring Jovoni Borkenhagen: Treating Annahi Short/[name]:[name], [name], [name] Weeks in Treatment: 14 Wound Status Wound Number: 9 Primary Diabetic Wound/Ulcer of the Lower Extremity Etiology: Wound Location: Left Achilles Wound Open Wounding Event: Trauma Status: Date Acquired: 10/15/2019 Comorbid Deep Vein Thrombosis, Hypertension, Weeks Of Treatment: 0 History: Peripheral Arterial Disease, Type II Diabetes Clustered Wound: No Photos Wound Measurements Length: (cm) 0.3 Width: (cm) 0.2 Depth: (cm) 0.1 Area: (cm) 0.047 Volume: (cm) 0.005 Wound Description Classification: Grade 1 Wound Margin: Flat and Intact Exudate Amount:  Small Exudate Type: Serous Exudate Color: amber Wound Bed Granulation Amount: Large (67-100%) Granulation Quality: Pink Necrotic Amount: None Present (0%) After Cleansing: No brino No Exposed Structure posed: No (Subcutaneous Tissue) Exposed: No posed: No posed: No osed: No sed: No o Skin Breakdown % Reduction in Area: 76% % Reduction in Volume: 75% Epithelialization: Small (1-33%) Tunneling: No Undermining: No Foul Odor Slough/Fi Fascia Ex Fat Layer Tendon Ex Muscle Ex Joint Exp Bone Expo Limited t Treatment Notes Wound #9 (Left Achilles) 1. Cleanse With Wound Cleanser 3. Primary Dressing Applied Collegen AG 4. Secondary Dressing Dry Gauze Roll Gauze 5. Secured With Tape Other (specify in notes) Notes foam border on achilles Electronic Signature(s) Signed: 11/03/2019 4:28:10 PM By: [name] EMT/HBOT Previous Signature: 10/22/2019 6:05:55 PM Version By: Deon Pilling Entered By: [name] on 11/03/2019 14:13:48 -------------------------------------------------------------------------------- Vitals Details Patient Name: Date of Service: [name] 10/22/2019 4:00 PM Medical Record Number:[id number] Patient Account Number: [id number] Date of Birth/Sex: Treating RN: August 04, 1941 (79 y.o. [name], [name] Primary Care Jshaun Abernathy: [name] Other Clinician: Referring Trinitey Roache: Treating Jamel Holzmann/[name]:[name], [name], [name] Weeks in Treatment: 14 Vital Signs Time Taken: 14:40 Temperature (F): 98 Height (in): 64 Pulse (bpm): 79 Weight (lbs): 142 Respiratory Rate (breaths/min): 15 Body Mass Index (BMI): 24.4 Blood Pressure (mmHg): 126/53 Capillary Blood Glucose (mg/dl): 130 Reference Range: 80 - 120 mg / dl Electronic Signature(s) Signed: 10/22/2019 6:05:55 PM By: Deon Pilling Entered By: Deon Pilling on 10/22/2019 14:40:17

## 2019-10-22 NOTE — Progress Notes (Addendum)
SHALI, VESEY ([id number]) Visit Report for 10/22/2019 Debridement Details Patient Name: Date of Service: [name], [name] 10/22/2019 4:00 PM Medical Record Number:[id number] Patient Account Number: [id number] Date of Birth/Sex: Treating RN: 1941-07-08 (79 y.o. [name], [name] Primary Care Provider: [name] Other Clinician: Referring Provider: Treating Provider/[name]:Shavana Calder, [name], [name] Weeks in Treatment: 14 Debridement Performed for Wound #1 Left,Dorsal Toe Fourth Assessment: Performed By: Physician Ricard Dillon., MD Debridement Type: Debridement Severity of Tissue Pre Bone involvement without necrosis Debridement: Level of Consciousness (Pre- Awake and Alert procedure): Pre-procedure Verification/Time Out Taken: Yes - 14:45 Start Time: 14:46 Pain Control: Other : benzocaine 20% Total Area Debrided (L x W): 0.7 (cm) x 0.4 (cm) = 0.28 (cm) Tissue and other material Viable, Non-Viable, Bone, Slough, Subcutaneous, Skin: Dermis , Fibrin/Exudate, debrided: Slough Level: Skin/Subcutaneous Tissue/Muscle/Bone Debridement Description: Excisional Instrument: Curette Bleeding: Minimum Hemostasis Achieved: Pressure End Time: 14:50 Procedural Pain: 0 Post Procedural Pain: 0 Response to Treatment: Procedure was tolerated well Level of Consciousness Awake and Alert (Post-procedure): Post Debridement Measurements of Total Wound Length: (cm) 0.7 Width: (cm) 0.4 Depth: (cm) 0.4 Volume: (cm) 0.088 Character of Wound/Ulcer Post Improved Debridement: Bone involvement without Severity of Tissue Post Debridement: necrosis Post Procedure Diagnosis Same as Pre-procedure Electronic Signature(s) Signed: 10/22/2019 5:54:40 PM By: [name] MD Signed: 10/22/2019 6:05:55 PM By: Deon Pilling Entered By: Deon Pilling on 10/22/2019 14:56:37 -------------------------------------------------------------------------------- Debridement  Details Patient Name: Date of Service: [name] 10/22/2019 4:00 PM Medical Record Number:[id number] Patient Account Number: [id number] Date of Birth/Sex: Treating RN: [birth year] (79 y.o. [name], [name] Primary Care Provider: [name] Other Clinician: Referring Provider: Treating Provider/[name]:Olumide Dolinger, [name], [name] Weeks in Treatment: 14 Debridement Performed for Wound #8 Left Toe Third Assessment: Performed By: Physician Ricard Dillon., MD Debridement Type: Debridement Severity of Tissue Pre Bone involvement without necrosis Debridement: Level of Consciousness (Pre- Awake and Alert procedure): Pre-procedure Verification/Time Out Taken: Yes - 14:45 Start Time: 14:46 Pain Control: Other : benzocaine 20% Total Area Debrided (L x W): 0.6 (cm) x 0.8 (cm) = 0.48 (cm) Tissue and other material Viable, Non-Viable, Bone, Slough, Subcutaneous, Skin: Dermis , Fibrin/Exudate, debrided: Slough Level: Skin/Subcutaneous Tissue/Muscle/Bone Debridement Description: Excisional Instrument: Curette Bleeding: Minimum Hemostasis Achieved: Pressure End Time: 14:50 Procedural Pain: 0 Post Procedural Pain: 0 Response to Treatment: Procedure was tolerated well Level of Consciousness Awake and Alert (Post-procedure): Post Debridement Measurements of Total Wound Length: (cm) 0.6 Width: (cm) 0.8 Depth: (cm) 0.4 Volume: (cm) 0.151 Character of Wound/Ulcer Post Improved Debridement: Bone involvement without Severity of Tissue Post Debridement: necrosis Post Procedure Diagnosis Same as Pre-procedure Electronic Signature(s) Signed: 10/22/2019 5:54:40 PM By: [name] MD Signed: 10/22/2019 6:05:55 PM By: Deon Pilling Entered By: Deon Pilling on 10/22/2019 14:56:42 -------------------------------------------------------------------------------- HPI Details Patient Name: Date of Service: [name] 10/22/2019 4:00 PM Medical Record  Number:[id number] Patient Account Number: [id number] Date of Birth/Sex: Treating RN: Sep 17, 1941 (79 y.o. F) Primary Care Provider: [name] Other Clinician: Referring Provider: Treating Provider/[name]:[place] Chelf, [name], [name] Weeks in Treatment: 14 History of Present Illness HPI Description: ADMISSION 07/13/2019 Patient is a 79 year old type II diabetic. She has known PAD. She has been followed by Dr. Jacqualyn Posey of podiatry for blistering areas on her toes dating back to 04/30/2019 which at this was the left fourth toe. By 8/11 she had wounds on the right and left second toes. She underwent arterial studies by Dr. Alvester Chou on 7/31 that showed ABIs on the right of 0.60 TBI of 0.26 on the left  ABI of 0.56 and a TBI of 0.25. By 9/10 she had ischemic-looking wounds per Dr. Earleen Newport on the right first, left first second and third. She has been using Medihoney and then mupirocin more recently simply Betadine. The patient underwent angiography by Dr. Gwenlyn Found on 9/21. 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 Noted that there was 1 vessel runoff in the foot via the peroneal. On the left there was an 80% left SFA, 70% mid left SFA. There was a [name] segment calcified CTO distal less than SFA and above-knee popliteal artery reconstituting with two-vessel runoff. 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 It was felt that she had bilateral SFA disease as well as tibial vessel disease. An attempt was made to revascularize the left SFA but they were unable to cross because of the highly calcified nature of the lesion. 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 She has an area on the medial left fourth toe with raised horned callus on top of this. I am  not certain what this represents. With regards to pain she has about a 2-1/2 I will claudication tolerance in the grocery store. She has some pain in night which is relieved by putting her feet down over the side of the bed. She is wearing Nitro-Dur patches on the top of her right foot. Past medical history includes type 2 diabetes with secondary PAD, neoplasm of the skin, diabetic retinopathy, carotid artery stenosis, hyperlipidemia hypertension. 11/2; the last time the patient was here I spoke to Dr. Gwenlyn Found about revascularization options on the right. As I understand things currently Dr. Gwenlyn Found spoke with Dr. Trula Slade and ultimately the patient was taken to surgery on 10/27. She had a right iliofemoral endarterectomy with a bovine patch angioplasty. I think the plan now is for her to have a staged intervention on the right SFA by Dr. Alvester Chou. Per the patient's understanding Dr. Gwenlyn Found and Dr. Jacqualyn Posey are waiting to see when the gangrenous toes on the right foot can be amputated. The patient states her pain is a lot better and she is grateful for that. 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 Small area on the left fourth toe. She is using Betadine to the mummified areas on the right and Medihoney on the left 08/27/2019; the last time I saw this patient I discharged her from the clinic. She had been revascularized by Dr. Trula Slade and she had a right iliofemoral endarterectomy with a bovine angioplasty. 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 Jacqualyn Posey of podiatry on 08/07/2019. I note that she also had a intervention by Dr. Gwenlyn Found and he performed a directional atherectomy and drug-coated balloon angioplasty of the SFA and popliteal artery on the right. I am not certain of the date of Dr. Kennon Holter procedure as of the time of this dictation. She was referred back to [place] by Dr. Earleen Newport predominantly for follow-up of the left  fourth toe. She still has sutures and stitches in the right TMA site. She states her pain is a lot better. She expresses concern about the condition of the amputation site at the TMA. She is on doxycycline I think prescribed by Dr. Earleen Newport. She is  complaining of some pain at night 12/10; I spoke to Dr. Jacqualyn Posey last week. He removed the sutures on the right foot on Monday of this week. She has the area on the left fourth toe just proximal to the PIP and then the right TMA site. She is still on doxycycline and has enough through next week. Unfortunately the TMA site does not look good at all. Both on the lateral and medial part of the incisions are areas that probe to bone. There is purulence over the medial part which I have cultured. We will use silver alginate. Left fourth toe looks somewhat better but there was still exposed bone 12/17; patient has an MRI booked for 12/30. Culture I did last week showed Pseudomonas Serratia and Enterococcus. This was purulent drainage coming out of the medial part of her amputation site. I use cefdinir 300 twice daily for 10 days that started on 12/15. Her x-ray on the right showed limited evaluation for osteomyelitis. The findings could have been postoperative. There was subtle erosion in the distal first and distal fifth metatarsal. An MRI was suggested. On the left she had irregularity of the fourth middle and proximal phalanx consistent with a history of osteomyelitis. I do not know that she has a history of osteomyelitis in this area. She had a newly defined area on the plantar third toe 12/31; patient's MRI is listed below: MRI OF THE RIGHT FOREFOOT WITHOUT AND WITH CONTRAST TECHNIQUE: Multiplanar, multisequence MR imaging of the right forefoot was performed before and after the administration of intravenous contrast. CONTRAST: 6 mL GADAVIST IV SOLN COMPARISON: Plain films right foot 09/04/2019. FINDINGS: Bones/Joint/Cartilage The patient is status  post transmetatarsal amputation as seen on the prior exam. Marrow edema and enhancement are seen in all of the distal metatarsals. In the first metatarsal, signal change extends 1.5 cm proximal to the stump and in the second metatarsal extends approximately 2 cm proximal to the stump. Edema and enhancement are seen in only the distal 0.7 cm of the third metatarsal stump and tips of the fourth and fifth metatarsals. Bone marrow signal is otherwise unremarkable. A small focus of subchondral edema is seen in the lateral talus, likely degenerative. Ligaments Intact. Muscles and Tendons No intramuscular fluid collection. Soft tissues Skin ulceration is seen off the stump of the first metatarsal. A thin fluid collection tracks deep to the wound and over the anterior metatarsals worrisome for small abscess. Intense subcutaneous edema and enhancement are seen diffusely. 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 Cellulitis about the foot. Skin ulceration over the distal first metatarsal is identified with a thin fluid collection tracking anteriorly along the stump worrisome for abscess. Small focus of subchondral edema in the lateral talus is likely degenerative. Electronically Signed By: Inge Rise M.D. On: 09/23/2019 15:25 Patient arrives in clinic today not complaining any of any pain. 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 She does not describe claudication however her activity is very limited. 10/01/2019; since the patient was last here I have communicated with Dr. Gwenlyn Found after bypass by Dr. Trula Slade and addressing the right superficial femoral artery he states that she is widely patent through peroneal arteries to the ankle with collaterals to the dorsalis  pedis. He states he is going to talk to colleagues about the feasibility of tibial pedal access. The  patient seems infectious disease later this afternoon Dr. Baxter Flattery. in preparation for this she has been off antibiotics for 1 week and I went ahead and obtain pieces of the remanence of her first metatarsal for pathology and CandS. The patient is a candidate for hyperbaric oxygen. 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 1/15; considerable improvement in the wounds on her feet. We are using silver collagen. She follows with Dr. Baxter Flattery of infectious disease and is on meropenem and daptomycin. She has been taught how to do this herself at home. Follows with Dr. Graylon Good at the end of treatment here. She has 2 wounds on the surgical TMA site 1 lateral and 1 medial the lateral 1 has regressed tremendously. The area medially still has some exposed bone although the base of this looks healthy. 1/22; 2 separate wounds on the surgical TMA site. Both of these look satisfactory. The medial area does not have any exposed bone. This is an improvement On the left side fourth toe dorsally over the proximal phalanx there is a deep punched out area that probes to bone. She has an area on the tip of the left third toe. She also tells [place] that in HBO she traumatized her left Achilles and this is left her with a superficial wound area 1/28; weekly visit along with HBO. She has 5 wounds. To our punched out areas on the original TMA site on the right. Both of these appear to have contracted. The area on the right no longer has exposed bone. She has an area on the tip of the left third toe and on the DIP of the left fourth toe. Both of these had surface callus that I removed and unfortunately they have small areas that both go to bone. She has a traumatic wound on the left Achilles area that happened in HBO and that appears better. She is tolerating her IV antibiotics well at  home. She has home health changing the dressings and she is doing it once on the weekends. We have been using silver collagen. She has been extensively revascularized on the right by Dr. Chauncy Lean him and subsequently by Dr. Gwenlyn Found. According to her she has severe PAD on the left but there was nothing that could be done to revascularize this I will need to review these notes Electronic Signature(s) Signed: 10/26/2019 6:39:44 PM By: [name] RN, BSN Signed: 10/26/2019 6:41:12 PM By: [name] MD Previous Signature: 10/22/2019 5:54:40 PM Version By: [name] MD Previous Signature: 10/22/2019 5:54:40 PM Version By: [name] MD Entered By: [name] on 10/26/2019 07:51:43 -------------------------------------------------------------------------------- Physical Exam Details Patient Name: Date of Service: [name] 10/22/2019 4:00 PM Medical Record Number:[id number] Patient Account Number: [id number] Date of Birth/Sex: Treating RN: 20-Jun-1941 (80 y.o. F) Primary Care Provider: [name] Other Clinician: Referring Provider: Treating Provider/[name]:Aminata Buffalo, [name], [name] Weeks in Treatment: 14 Notes Wound exam The right TMA site x2 the medial part does not have any exposed bone this appears to be contracting as is the lateral part. The whole area is a lot better. On the tip of the left third toe callus the nonviable tissue debrided with a #3 curette unfortunately there is exposed bone here. I am not the bone back with a #3 curette hemostasis with direct pressure On the PIP of the left third toe dorsally similar to the third toe debris with a small hole. Again exposed bone. I have taken this down. The area on  the left Achilles looks a lot better this was trauma while she was getting up on the HBO stretcher from 2 weeks ago I believe Electronic Signature(s) Signed: 10/22/2019 5:54:40 PM By: [name] MD Entered By: [name] on  10/22/2019 15:08:57 -------------------------------------------------------------------------------- Physician Orders Details Patient Name: Date of Service: [name] 10/22/2019 4:00 PM Medical Record Number:[id number] Patient Account Number: [id number] Date of Birth/Sex: Treating RN: [birth year] (79 y.o. 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 - schedule before HBO Dressing Change Frequency Change Dressing every other day. - home health to change twice a week. wound center weekly. Wound Cleansing Clean wound with Wound Cleanser Primary Wound Dressing Wound #1 Left,Dorsal Toe Fourth Silver Collagen - moisten with hydrogel. Wound #10 Right,Lateral Amputation Site - Transmetatarsal Silver Collagen - moisten with Hydrogel Wound #7 Right Amputation Site - Transmetatarsal Silver Collagen - moisten with hydrogel. Wound #8 Left Toe Third Silver Collagen - moisten with hydrogel Wound #9 Left Achilles Silver Collagen - moisten with hydrogel Secondary Dressing Wound #1 Left,Dorsal Toe Fourth Kerlix/Rolled Gauze Wound #10 Right,Lateral Amputation Site - Transmetatarsal Kerlix/Rolled Gauze Wound  #7 Right Amputation Site - Transmetatarsal Kerlix/Rolled Gauze ABD pad Wound #8 Left Toe Third Kerlix/Rolled Gauze Dry Gauze Wound #9 Left Achilles Foam Edema Control Avoid standing for long periods of time Elevate legs to the level of the heart or above for 30 minutes daily and/or when sitting, a frequency of: - throughout the day. Off-Loading DH Walker Boot to: - right foot Open toe surgical [name] to: - felt in surgical [name] for 3rd toe. wound center to provide patient with [name] today. [place] skilled nursing for wound care. - [facility] twice a week. Electronic Signature(s) Signed: 10/22/2019 5:54:40 PM By: [name] MD Signed: 10/22/2019 6:05:55 PM By: Deon Pilling Entered By: Deon Pilling on 10/22/2019 14:53:43 -------------------------------------------------------------------------------- Problem List Details Patient Name: Date of Service: [name] 10/22/2019 4:00 PM Medical Record Number:[id number] Patient Account Number: [id number] Date of Birth/Sex: Treating RN: 02/07/1941 (79 y.o. [name], [name] Primary Care Provider: [name] Other Clinician: Referring Provider: Treating Provider/[name]:Dellar Traber, [name], [name] Weeks in Treatment: 76 Active Problems ICD-10 Evaluated Encounter Code Description Active Date Today Diagnosis E11.621 Type 2 diabetes mellitus with foot ulcer 07/13/2019 No Yes E11.52 Type 2 diabetes mellitus with diabetic peripheral 07/13/2019 No Yes angiopathy with gangrene L97.518 Non-pressure chronic ulcer of other part of right foot 07/13/2019 No Yes with other specified severity L97.528 Non-pressure chronic ulcer of other part of left foot 07/13/2019 No Yes with other specified severity E11.42 Type 2 diabetes mellitus with diabetic polyneuropathy 07/13/2019 No Yes 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, right ankle and foot 10/01/2019 No Yes L97.421 Non-pressure chronic ulcer  of left heel and midfoot 10/16/2019 No Yes limited to breakdown of skin Inactive Problems Resolved Problems Electronic Signature(s) Signed: 10/22/2019 5:54:40 PM By: Dellia Nims,  Legrand Como MD Entered By: [name] on 10/22/2019 15:04:22 -------------------------------------------------------------------------------- Progress Note Details Patient Name: Date of Service: CAHTERINE, HEINZEL 10/22/2019 4:00 PM Medical Record Number:[id number] Patient Account Number: [id number] Date of Birth/Sex: Treating RN: 15-Sep-1941 (79 y.o. F) Primary Care Provider: [name] Other Clinician: Referring Provider: Treating Provider/[name]:Leler Brion, [name], [name] Weeks in Treatment: 14 Subjective History of Present Illness (HPI) ADMISSION 07/13/2019 Patient is a 79 year old type II diabetic. She has known PAD. She has been followed by Dr. Jacqualyn Posey of podiatry for blistering areas on her toes dating back to 04/30/2019 which at this was the left fourth toe. By 8/11 she had wounds on the right and left second toes. She underwent arterial studies by Dr. Alvester Chou on 7/31 that showed ABIs on the right of 0.60 TBI of 0.26 on the left ABI of 0.56 and a TBI of 0.25. By 9/10 she had ischemic-looking wounds per Dr. Earleen Newport on the right first, left first second and third. She has been using Medihoney and then mupirocin more recently simply Betadine. The patient underwent angiography by Dr. Gwenlyn Found on 9/21. 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 Noted that there was 1 vessel runoff in the foot via the peroneal. ooOn the left there was an 80% left SFA, 70% mid left SFA. There was a [name] segment calcified CTO distal less than SFA and above-knee popliteal artery reconstituting with two-vessel runoff. 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 It was felt that she had bilateral SFA disease as  well as tibial vessel disease. An attempt was made to revascularize the left SFA but they were unable to cross because of the highly calcified nature of the lesion. 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 She has an area on the medial left fourth toe with raised horned callus on top of this. I am not certain what this represents. With regards to pain she has about a 2-1/2 I will claudication tolerance in the grocery store. She has some pain in night which is relieved by putting her feet down over the side of the bed. She is wearing Nitro-Dur patches on the top of her right foot. Past medical history includes type 2 diabetes with secondary PAD, neoplasm of the skin, diabetic retinopathy, carotid artery stenosis, hyperlipidemia hypertension. 11/2; the last time the patient was here I spoke to Dr. Gwenlyn Found about revascularization options on the right. As I understand things currently Dr. Gwenlyn Found spoke with Dr. Trula Slade and ultimately the patient was taken to surgery on 10/27. She had a right iliofemoral endarterectomy with a bovine patch angioplasty. I think the plan now is for her to have a staged intervention on the right SFA by Dr. Alvester Chou. Per the patient's understanding Dr. Gwenlyn Found and Dr. Jacqualyn Posey are waiting to see when the gangrenous toes on the right foot can be amputated. The patient states her pain is a lot better and she is grateful for that. 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 Small area on the left fourth toe. She is using Betadine to the mummified areas on the right and Medihoney on the left 08/27/2019; the last time I saw this patient I discharged her from the clinic. She had been revascularized by Dr. Trula Slade and she  had a right iliofemoral endarterectomy with a bovine angioplasty. 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 Jacqualyn Posey of podiatry on 08/07/2019. I note that she also had a intervention by Dr. Gwenlyn Found and he performed a directional atherectomy and drug-coated balloon angioplasty of the SFA and popliteal artery on the right. I am not certain of the date of Dr. Kennon Holter procedure as of the time of this dictation. She was referred back to [place] by Dr. Earleen Newport predominantly for follow-up of the left fourth toe. She still has sutures and stitches in the right TMA site. She states her pain is a lot better. She expresses concern about the condition of the amputation site at the TMA. She is on doxycycline I think prescribed by Dr. Earleen Newport. She is complaining of some pain at night 12/10; I spoke to Dr. Jacqualyn Posey last week. He removed the sutures on the right foot on Monday of this week. She has the area on the left fourth toe just proximal to the PIP and then the right TMA site. She is still on doxycycline and has enough through next week. Unfortunately the TMA site does not look good at all. Both on the lateral and medial part of the incisions are areas that probe to bone. There is purulence over the medial part which I have cultured. We will use silver alginate. Left fourth toe looks somewhat better but there was still exposed bone 12/17; patient has an MRI booked for 12/30. Culture I did last week showed Pseudomonas Serratia and Enterococcus. This was purulent drainage coming out of the medial part of her amputation site. I use cefdinir 300 twice daily for 10 days that started on 12/15. Her x-ray on the right showed limited evaluation for osteomyelitis. The findings could have been postoperative. There was subtle erosion in the distal first and distal fifth metatarsal. An MRI was suggested. On the left she had irregularity of the fourth middle and proximal phalanx consistent with a history of osteomyelitis. I do not know that she has a history of osteomyelitis in this  area. She had a newly defined area on the plantar third toe 12/31; patient's MRI is listed below: MRI OF THE RIGHT FOREFOOT WITHOUT AND WITH CONTRAST TECHNIQUE: Multiplanar, multisequence MR imaging of the right forefoot was performed before and after the administration of intravenous contrast. CONTRAST: 6 mL GADAVIST IV SOLN COMPARISON: Plain films right foot 09/04/2019. FINDINGS: Bones/Joint/Cartilage The patient is status post transmetatarsal amputation as seen on the prior exam. Marrow edema and enhancement are seen in all of the distal metatarsals. In the first metatarsal, signal change extends 1.5 cm proximal to the stump and in the second metatarsal extends approximately 2 cm proximal to the stump. Edema and enhancement are seen in only the distal 0.7 cm of the third metatarsal stump and tips of the fourth and fifth metatarsals. Bone marrow signal is otherwise unremarkable. A small focus of subchondral edema is seen in the lateral talus, likely degenerative. Ligaments Intact. Muscles and Tendons No intramuscular fluid collection. Soft tissues Skin ulceration is seen off the stump of the first metatarsal. A thin fluid collection tracks deep to the wound and over the anterior metatarsals worrisome for small abscess. Intense subcutaneous edema and enhancement are seen diffusely. 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 Cellulitis about the foot. Skin ulceration over the distal first metatarsal  is identified with a thin fluid collection tracking anteriorly along the stump worrisome for abscess. Small focus of subchondral edema in the lateral talus is likely degenerative. Electronically Signed By: Inge Rise M.D. On: 09/23/2019 15:25 Patient arrives in clinic today not complaining any of any pain. We have been using silver  alginate to the predominant areas in the surgical site on her right transmetatarsal amputation. She does not describe claudication however her activity is very limited. 10/01/2019; since the patient was last here I have communicated with Dr. Gwenlyn Found after bypass by Dr. Trula Slade and addressing the right superficial femoral artery he states that she is widely patent through peroneal arteries to the ankle with collaterals to the dorsalis pedis. He states he is going to talk to colleagues about the feasibility of tibial pedal access. The patient seems infectious disease later this afternoon Dr. Baxter Flattery. in preparation for this she has been off antibiotics for 1 week and I went ahead and obtain pieces of the remanence of her first metatarsal for pathology and CandS. The patient is a candidate for hyperbaric oxygen. 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 1/15; considerable improvement in the wounds on her feet. We are using silver collagen. She follows with Dr. Baxter Flattery of infectious disease and is on meropenem and daptomycin. She has been taught how to do this herself at home. Follows with Dr. Graylon Good at the end of treatment here. She has 2 wounds on the surgical TMA site 1 lateral and 1 medial the lateral 1 has regressed tremendously. The area medially still has some exposed bone although the base of this looks healthy. 1/22; 2 separate wounds on the surgical TMA site. Both of these look satisfactory. The medial area does not have any exposed bone. This is an improvement On the left side fourth toe dorsally over the proximal phalanx there is a deep punched out area that probes to bone. She has an area on the tip of the left third toe. She also tells [place] that in HBO she traumatized her left Achilles and this is left her with a superficial wound area 1/28; weekly visit along with HBO. She has 5 wounds. To our punched out areas on the original TMA site on  the right. Both of these appear to have contracted. The area on the right no longer has exposed bone. She has an area on the tip of the left third toe and on the DIP of the left fourth toe. Both of these had surface callus that I removed and unfortunately they have small areas that both go to bone. She has a traumatic wound on the left Achilles area that happened in HBO and that appears better. She is tolerating her IV antibiotics well at home. She has home health changing the dressings and she is doing it once on the weekends. We have been using silver collagen. She has been extensively revascularized on the right by Dr. Chauncy Lean him and subsequently by Dr. Gwenlyn Found. According to her she has severe PAD on the left but there was nothing that could be done to revascularize this I will need to review these notes Objective Constitutional Vitals Time Taken: 2:40 PM, Height: 64 in, Weight: 142 lbs, BMI: 24.4, Temperature: 98 F, Pulse: 79 bpm, Respiratory Rate: 15 breaths/min, Blood Pressure: 126/53 mmHg, Capillary Blood Glucose: 130 mg/dl. Integumentary (Hair, Skin) Wound #1 status is Open. Original cause of wound was Blister. The wound is located on the Left,Dorsal Toe Fourth.  The wound measures 0.7cm length x 0.4cm width x 0.4cm depth; 0.22cm^2 area and 0.088cm^3 volume. There is bone and Fat Layer (Subcutaneous Tissue) Exposed exposed. There is no tunneling or undermining noted. There is a small amount of serosanguineous drainage noted. The wound margin is distinct with the outline attached to the wound base. There is large (67-100%) red granulation within the wound bed. There is a small (1-33%) amount of necrotic tissue within the wound bed including Adherent Slough. Wound #10 status is Open. Original cause of wound was Surgical Injury. The wound is located on the Right,Lateral Amputation Site - Transmetatarsal. The wound measures 0.5cm length x 0.6cm width x 0.2cm depth; 0.236cm^2 area and  0.047cm^3 volume. There is Fat Layer (Subcutaneous Tissue) Exposed exposed. There is no tunneling or undermining noted. There is a small amount of serosanguineous drainage noted. The wound margin is distinct with the outline attached to the wound base. There is large (67-100%) red granulation within the wound bed. There is a small (1-33%) amount of necrotic tissue within the wound bed including Adherent Slough. Wound #7 status is Open. Original cause of wound was Surgical Injury. 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 The wound measures 1.1cm length x 2cm width x 1cm depth; 1.728cm^2 area and 1.728cm^3 volume. There is Fat Layer (Subcutaneous Tissue) Exposed exposed. There is no tunneling noted, however, there is undermining starting at 6:00 and ending at 9:00 with a maximum distance of 0.3cm. There is a medium amount of serosanguineous drainage noted. The wound margin is well defined and not attached to the wound base. There is large (67-100%) red granulation within the wound bed. There is a small (1-33%) amount of necrotic tissue within the wound bed including Adherent Slough. Wound #8 status is Open. Original cause of wound was Gradually Appeared. The wound is located on the Left Toe Third. The wound measures 0.6cm length x 0.8cm width x 0.4cm depth; 0.377cm^2 area and 0.151cm^3 volume. There is Fat Layer (Subcutaneous Tissue) Exposed exposed. There is no tunneling or undermining noted. There is a small amount of serosanguineous drainage noted. The wound margin is distinct with the outline attached to the wound base. There is no granulation within the wound bed. There is a large (67-100%) amount of necrotic tissue within the wound bed including Eschar. Wound #9 status is Open. Original cause of wound was Trauma. The wound is located on the Left Achilles. The wound measures 0.3cm length x 0.2cm width x 0.1cm depth; 0.047cm^2 area and 0.005cm^3 volume. The wound  is limited to skin breakdown. There is no tunneling or undermining noted. There is a small amount of serous drainage noted. The wound margin is flat and intact. There is large (67-100%) pink granulation within the wound bed. There is no necrotic tissue within the wound bed. 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 There was a Excisional Skin/Subcutaneous Tissue/Muscle/Bone Debridement with a total area  of 0.28 sq cm performed by Ricard Dillon., MD. With the following instrument(s): Curette to remove Viable and Non-Viable tissue/material. Material removed includes Bone,Subcutaneous Tissue, Slough, Skin: Dermis, and Fibrin/Exudate after achieving pain control using Other (benzocaine 20%). A time out was conducted at 14:45, prior to the start of the procedure. A Minimum amount of bleeding was controlled with Pressure. The procedure was tolerated well with a pain level of 0 throughout and a pain level of 0 following the procedure. Post Debridement Measurements: 0.7cm length x 0.4cm width x 0.4cm depth; 0.088cm^3 volume. Character of Wound/Ulcer Post Debridement is improved. Severity of Tissue Post Debridement is: Bone involvement without necrosis. Post procedure Diagnosis Wound #1: Same as Pre-Procedure Wound  #8 Pre-procedure diagnosis of Wound #8 is a Diabetic Wound/Ulcer of the Lower Extremity located on the Left Toe Third .Severity of Tissue Pre Debridement is: Bone involvement without necrosis. There was a Excisional Skin/Subcutaneous Tissue/Muscle/Bone Debridement with a total area of 0.48 sq cm performed by Ricard Dillon., MD. With the following instrument(s): Curette to remove Viable and Non-Viable tissue/material. Material removed includes Bone,Subcutaneous Tissue, Slough, Skin: Dermis, and Fibrin/Exudate after achieving pain control using Other (benzocaine 20%). A time out was conducted at 14:45, prior to the start of the procedure. A Minimum amount of bleeding was controlled with Pressure. The procedure was tolerated well with a pain level of 0 throughout and a pain level of 0 following the procedure. Post Debridement Measurements: 0.6cm length x 0.8cm width x 0.4cm depth; 0.151cm^3 volume. Character of Wound/Ulcer Post Debridement is improved. Severity of Tissue Post Debridement is: Bone involvement without necrosis. Post procedure Diagnosis Wound #8: Same as Pre-Procedure Plan Follow-up Appointments: Return Appointment in 1 week. - schedule before HBO Dressing Change Frequency: Change Dressing every other day. - home health to change twice a week. wound center weekly. Wound Cleansing: Clean wound with Wound Cleanser Primary Wound Dressing: Wound #1 Left,Dorsal Toe Fourth: Silver Collagen - moisten with hydrogel. Wound #10 Right,Lateral Amputation Site - Transmetatarsal: Silver Collagen - moisten with Hydrogel Wound #7 Right Amputation Site - Transmetatarsal: Silver Collagen - moisten with hydrogel. Wound #8 Left Toe Third: Silver Collagen - moisten with hydrogel Wound #9 Left Achilles: Silver Collagen - moisten with hydrogel Secondary Dressing: Wound #1 Left,Dorsal Toe Fourth: Kerlix/Rolled Gauze Wound #10 Right,Lateral Amputation Site - Transmetatarsal: Kerlix/Rolled  Gauze Wound #7 Right Amputation Site - Transmetatarsal: Kerlix/Rolled Gauze ABD pad Wound #8 Left Toe Third: Kerlix/Rolled Gauze Dry Gauze Wound #9 Left Achilles: Foam Edema Control: Avoid standing for long periods of time Elevate legs to the level of the heart or above for 30 minutes daily and/or when sitting, a frequency of: - throughout the day. Off-Loading: DH Walker Boot to: - right foot Open toe surgical [name] to: - felt in surgical [name] for 3rd toe. wound center to provide patient with [name] today. Home Health: [facility] for wound care. - [facility] twice a week. 1. I am continuing silver alginate to all wound areas 2. Her IV antibiotics well and following with infectious disease 3. I need to review the angiogram as it applies to her left leg. She was extensively revascularized on the right Electronic Signature(s) Signed: 10/26/2019 6:39:44 PM By: [name] RN, BSN Signed: 10/26/2019 6:41:12 PM By: [name] MD Previous Signature: 10/22/2019 5:54:40 PM Version By: [name] MD Entered By: [name] on 10/26/2019 07:51:53 -------------------------------------------------------------------------------- [place] Details Patient Name: Date of Service: [name] 10/22/2019 Medical Record Number:[id number] Patient  Account Number: [id number] Date of Birth/Sex: Treating RN: May 25, 1941 (79 y.o. [name], [name] Primary Care Provider: [name] Other Clinician: Referring Provider: Treating Provider/[name]:Chaeli Judy, [name], [name] Weeks in Treatment: 14 Diagnosis Coding ICD-10 Codes Code Description E11.621 Type 2 diabetes mellitus with foot ulcer E11.52 Type 2 diabetes mellitus with diabetic peripheral angiopathy with gangrene L97.518 Non-pressure chronic ulcer of other part of right foot with other specified severity L97.528 Non-pressure chronic ulcer of other part of left foot with other  specified severity E11.42 Type 2 diabetes mellitus with diabetic polyneuropathy 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, 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 Facility Procedures The patient participates with Medicare or their insurance follows the Medicare Facility Guidelines: CPT4 Code Description Modifier Quantity [id number] 11044 - DEB BONE 20 SQ CM/< 1 ICD-10 Diagnosis Description L97.528 Non-pressure chronic ulcer of other  part of left foot with other specified severity E11.621 Type 2 diabetes mellitus with foot ulcer Physician Procedures CPT4 Description: Code 8099833 Debridement; bone (includes epidermis, dermis, subQ tissue, muscle and/or fascia, if performed) 1st 20 sqcm or less ICD-10 Diagnosis Description L97.528 Non-pressure chronic ulcer of other part of left foot with other sp  E11.621 Type 2 diabetes mellitus with foot ulcer Modifier: ecified seve Quantity: 1 rity Electronic Signature(s) Signed: 10/22/2019 5:54:40 PM By: [name] MD Entered By: [name] on 10/22/2019 15:09:52

## 2019-10-22 NOTE — Progress Notes (Signed)
MALEIA, WEEMS ([id number]) Visit Report for 10/22/2019 Arrival Information Details Patient Name: Date of Service: ALELI, NAVEDO 10/22/2019 2:00 PM Medical Record Number:[id number] Patient Account Number: [id number] Date of Birth/Sex: Treating RN: Jun 28, 1941 (79 y.o. F) Primary Care Jamy Cleckler: [name] Other Clinician: [name] Referring Burlie Cajamarca: Treating Nohely Whitehorn/[name]:[name], [name], [name] Weeks in Treatment: 14 Visit Information History Since Last Visit Added or deleted any medications: No Patient Arrived: Walker Any new allergies or adverse reactions: No Arrival Time: 14:15 Had a fall or experienced change in No Accompanied By: self activities of daily living that may affect Transfer Assistance: None risk of falls: Patient Identification Verified: Yes Signs or symptoms of abuse/neglect since last No Secondary Verification Process Yes visito Completed: Hospitalized since last visit: No Patient Requires Transmission- No Implantable device outside of the clinic excluding No Based Precautions: cellular tissue based products placed in the center Patient Has Alerts: Yes since last visit: Patient Alerts: Right ABI .62 TBI Pain Present Now: No refused Left ABI .60 TBI .14 Electronic Signature(s) Signed: 10/22/2019 5:34:21 PM By: [name] EMT/HBOT Entered By: [name] on 10/22/2019 15:54:37 -------------------------------------------------------------------------------- Encounter Discharge Information Details Patient Name: Date of Service: [name] 10/22/2019 2:00 PM Medical Record Number:[id number] Patient Account Number: [id number] Date of Birth/Sex: Treating RN: 10/11/1940 (79 y.o. F) Primary Care Glenisha Gundry: [name] Other Clinician: [name] Referring Anquan Azzarello: Treating Lynnea Vandervoort/[name]:[name], [name], [name] Weeks in Treatment: 14 Encounter Discharge Information Items Discharge  Condition: Stable Ambulatory Status: Walker Discharge Destination: Home Transportation: Private Auto Accompanied By: self Schedule Follow-up Appointment: Yes Clinical Summary of Care: Patient Declined Electronic Signature(s) Signed: 10/22/2019 5:34:21 PM By: [name] EMT/HBOT Entered By: [name] on 10/22/2019 17:33:54 -------------------------------------------------------------------------------- Patient/Caregiver Education Details Patient Name: Date of Service: [name] 1/28/2021andnbsp2:00 PM Medical Record Patient Account Number: [id number] Number: Treating RN: Date of Birth/Gender: 04-02-1941 (78 y.o. F) Other Clinician: [name] Primary Care Physician: Verna Czech Referring Physician: Physician/[name]: Darl Pikes in Treatment: 14 Education Assessment Education Provided To: Patient Education Topics Provided Hyperbaric Oxygenation: Methods: Explain/Verbal Responses: State content correctly Electronic Signature(s) Signed: 10/22/2019 5:34:21 PM By: [name] EMT/HBOT Entered By: [name] on 10/22/2019 17:33:43 -------------------------------------------------------------------------------- Vitals Details Patient Name: Date of Service: [name] 10/22/2019 2:00 PM Medical Record Number:[id number] Patient Account Number: [id number] Date of Birth/Sex: Treating RN: 1941/06/08 (79 y.o. F) Primary Care Winfield Caba: [name] Other Clinician: [name] Referring Mena Lienau: Treating Ahkeem Goede/[name]:[name], [name], [name] Weeks in Treatment: 14 Vital Signs Time Taken: 14:20 Temperature (F): 98.2 Height (in): 64 Pulse (bpm): 79 Weight (lbs): 142 Respiratory Rate (breaths/min): 15 Body Mass Index (BMI): 24.4 Blood Pressure (mmHg): 126/53 Capillary Blood Glucose (mg/dl): 130 Reference Range: 80 - 120 mg / dl Electronic Signature(s) Signed:  10/22/2019 5:34:21 PM By: [name] EMT/HBOT Entered By: [name] on 10/22/2019 15:54:56

## 2019-10-23 ENCOUNTER — Encounter (HOSPITAL_BASED_OUTPATIENT_CLINIC_OR_DEPARTMENT_OTHER): Payer: Medicare Other | Admitting: Internal Medicine

## 2019-10-23 ENCOUNTER — Other Ambulatory Visit: Payer: Self-pay | Admitting: Cardiovascular Disease

## 2019-10-23 ENCOUNTER — Other Ambulatory Visit: Payer: Self-pay | Admitting: Endocrinology

## 2019-10-23 DIAGNOSIS — L97514 Non-pressure chronic ulcer of other part of right foot with necrosis of bone: Secondary | ICD-10-CM | POA: Diagnosis not present

## 2019-10-23 DIAGNOSIS — E11319 Type 2 diabetes mellitus with unspecified diabetic retinopathy without macular edema: Secondary | ICD-10-CM | POA: Diagnosis not present

## 2019-10-23 DIAGNOSIS — L97512 Non-pressure chronic ulcer of other part of right foot with fat layer exposed: Secondary | ICD-10-CM | POA: Diagnosis not present

## 2019-10-23 DIAGNOSIS — E11621 Type 2 diabetes mellitus with foot ulcer: Secondary | ICD-10-CM | POA: Diagnosis not present

## 2019-10-23 DIAGNOSIS — L97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 limited to breakdown of skin: Secondary | ICD-10-CM | POA: Diagnosis not present

## 2019-10-23 DIAGNOSIS — E1152 Type 2 diabetes mellitus with diabetic peripheral angiopathy with gangrene: Secondary | ICD-10-CM | POA: Diagnosis not present

## 2019-10-23 DIAGNOSIS — L97522 Non-pressure chronic ulcer of other part of left foot with fat layer exposed: Secondary | ICD-10-CM | POA: Diagnosis not present

## 2019-10-23 LAB — GLUCOSE, CAPILLARY
Glucose-Capillary: 142 mg/dL — ABNORMAL HIGH (ref 70–99)
Glucose-Capillary: 147 mg/dL — ABNORMAL HIGH (ref 70–99)

## 2019-10-23 NOTE — Progress Notes (Addendum)
[name], [name] ([id number]) Visit Report for 10/23/2019 HBO Details Patient Name: Date of Service: [name], [name] 10/23/2019 2:00 PM Medical Record Number:[id number] Patient Account Number: [id number] Date of Birth/Sex: Treating RN: 1940/11/04 (79 y.o. F) Primary Care [name]: [name] Other Clinician: Mikeal [name] Referring [name]: Treating [name]/[name]:[name]: 14 HBO [name] Course Details [name] Course Number: 1 Ordering [name]: [name] Total Treatments Ordered: 40 HBO [name] Start Date: 10/12/2019 HBO Indication: Diabetic Ulcer(s) of the Lower Extremity HBO [name] Details [name] Number: [id number] Patient Type: Outpatient Chamber Type: Monoplace Chamber Serial #: [id number] [name] Protocol: 2.5 ATA with 90 minutes oxygen, with two 5 minute air breaks [name] Details Compression Rate Down: 2.0 psi / minute De-Compression Rate Up: 2.0 psi / minute Air breaks and CompressTx Pressure breathing periods DecompressDecompress Begins Reached (leave unused spaces Begins Ends blank) Chamber Pressure (ATA)1 2.5 2.5 2.5 2.5 2.5 --2.5 1 Clock Time (24 hr) 14:10 14:22 35:5732:2025:4270:62--37:62 16:14 [name] Length: 124 (minutes) [name] Segments: 4 Vital Signs Capillary Blood Glucose Reference Range: 80 - 120 mg / dl HBO Diabetic Blood Glucose Intervention Range: <131 mg/dl or >249 mg/dl Time Vitals Blood Respiratory Capillary Blood Glucose Pulse Action Type: Pulse: Temperature: Taken: Pressure: Rate: Glucose (mg/dl): Meter #: Oximetry (%) Taken: Pre 13:50 141/46 79 17 97.6 142 Post 16:17 144/97 78 14 98.2 147 [name] Response [name] Toleration: Well [name] Completion [name] Completed without Adverse Event Status: Additional Procedure Documentation Tissue Sevierity: Necrosis of bone Detrick Dani Notes No concerns with [name] given Physician HBO Attestation: I certify that  I supervised this HBO [name] in accordance with Medicare guidelines. A trained Yes emergency response team is readily available per hospital policies and procedures. Continue HBOT as ordered. Yes Electronic Signature(s) Signed: 10/23/2019 5:55:42 PM By: [name] Ham MD Previous Signature: 10/23/2019 4:26:31 PM Version By: [name] EMT/HBOT Entered By: [name] on 10/23/2019 17:42:11 -------------------------------------------------------------------------------- HBO Safety Checklist Details Patient Name: Date of Service: [name] 10/23/2019 2:00 PM Medical Record Number:[id number] Patient Account Number: [id number] Date of Birth/Sex: Treating RN: [birth year] (79 y.o. F) Primary Care [name]: [name] Other Clinician: Mikeal [name] Referring Ho Parisi: Treating [name]/[name]:[name]: 14 HBO Safety Checklist Items Safety Checklist Consent Form Signed Patient voided / foley secured and emptied When did you last eato 1200 - [name] sammy - boost Last dose of injectable or oral agent insulin NA Ostomy pouch emptied and vented if applicable All implantable devices assessed, documented and approved Intravenous access site secured and place PICC line left arm Valuables secured Linens and cotton and cotton/polyester blend (less than 51% polyester) Personal oil-based products / skin lotions / body lotions removed Wigs or hairpieces removed NA Smoking or tobacco materials removed Books / newspapers / magazines / loose paper removed Cologne, aftershave, perfume and deodorant removed Jewelry removed (may wrap wedding band) Make-up removed Hair care products removed NA Battery operated devices (external) removed NA Heating patches and chemical warmers removed NA Titanium eyewear removed Nail polish cured greater than 10 hours NA Casting material cured greater than 10 hours NA Hearing aids removed NA Loose  dentures or partials removed NA Prosthetics have been removed Patient demonstrates correct use of air break device (if applicable) Patient concerns have been addressed Patient grounding bracelet on and cord attached to chamber Specifics for Inpatients (complete in addition to above) Medication sheet sent with patient Intravenous medications needed or due during therapy sent with patient Drainage tubes (e.g. nasogastric tube or chest tube secured and  vented) Endotracheal or Tracheotomy tube secured Cuff deflated of air and inflated with saline Airway suctioned Electronic Signature(s) Signed: 10/23/2019 2:48:26 PM By: [name] EMT/HBOT Entered By: [name] on 10/23/2019 14:48:25

## 2019-10-23 NOTE — Progress Notes (Signed)
JOLEENE, BURNHAM ([id number]) Visit Report for 10/23/2019 Arrival Information Details Patient Name: Date of Service: NAYLEAH, GAMEL 10/23/2019 2:00 PM Medical Record Number:[id number] Patient Account Number: [id number] Date of Birth/Sex: Treating RN: Jan 27, 1941 (79 y.o. F) Primary Care Lashana Spang: [name] Other Clinician: [name] Referring Kathlyn Leachman: Treating Clem Wisenbaker/[name]:[name], [name], [name] Weeks in Treatment: 14 Visit Information History Since Last Visit Added or deleted any medications: No Patient Arrived: Walker Any new allergies or adverse reactions: No Arrival Time: 13:45 Had a fall or experienced change in No Accompanied By: self activities of daily living that may affect Transfer Assistance: None risk of falls: Patient Identification Verified: Yes Signs or symptoms of abuse/neglect since last No Secondary Verification Process Yes visito Completed: Hospitalized since last visit: No Patient Requires Transmission- No Implantable device outside of the clinic excluding No Based Precautions: cellular tissue based products placed in the center Patient Has Alerts: Yes since last visit: Patient Alerts: Right ABI .62 TBI Pain Present Now: No refused Left ABI .60 TBI .14 Electronic Signature(s) Signed: 10/23/2019 4:26:31 PM By: [name] EMT/HBOT Entered By: [name] on 10/23/2019 14:45:54 -------------------------------------------------------------------------------- Encounter Discharge Information Details Patient Name: Date of Service: [name] 10/23/2019 2:00 PM Medical Record Number:[id number] Patient Account Number: [id number] Date of Birth/Sex: Treating RN: 06-May-1941 (79 y.o. F) Primary Care Sharief Wainwright: [name] Other Clinician: [name] Referring Mckay Brandt: Treating Tenaya Hilyer/[name]:[name], [name], [name] Weeks in Treatment: 14 Encounter Discharge Information Items Discharge  Condition: Stable Ambulatory Status: Walker Discharge Destination: Home Transportation: Private Auto Accompanied By: self Schedule Follow-up Appointment: Yes Clinical Summary of Care: Patient Declined Electronic Signature(s) Signed: 10/23/2019 4:26:31 PM By: [name] EMT/HBOT Entered By: [name] on 10/23/2019 16:26:07 -------------------------------------------------------------------------------- Patient/Caregiver Education Details Patient Name: Date of Service: [name] 1/29/2021andnbsp2:00 PM Medical Record Patient Account Number: [id number] Number: Treating RN: Date of Birth/Gender: [birth year] (78 y.o. F) Other Clinician: [name] Primary Care Physician: Verna Czech Referring Physician: Physician/[name]: Darl Pikes in Treatment: 14 Education Assessment Education Provided To: Patient Education Topics Provided Hyperbaric Oxygenation: Methods: Explain/Verbal Responses: State content correctly Electronic Signature(s) Signed: 10/23/2019 4:26:31 PM By: [name] EMT/HBOT Entered By: [name] on 10/23/2019 16:25:49 -------------------------------------------------------------------------------- Vitals Details Patient Name: Date of Service: [name] 10/23/2019 2:00 PM Medical Record Number:[id number] Patient Account Number: [id number] Date of Birth/Sex: Treating RN: Jul 30, 1941 (79 y.o. F) Primary Care Taher Vannote: [name] Other Clinician: [name] Referring Ariely Riddell: Treating Khup Sapia/[name]:[name], [name], [name] Weeks in Treatment: 14 Vital Signs Time Taken: 13:50 Temperature (F): 97.6 Height (in): 64 Pulse (bpm): 79 Weight (lbs): 142 Respiratory Rate (breaths/min): 17 Body Mass Index (BMI): 24.4 Blood Pressure (mmHg): 141/46 Capillary Blood Glucose (mg/dl): 142 Reference Range: 80 - 120 mg / dl Electronic Signature(s) Signed:  10/23/2019 4:26:31 PM By: [name] EMT/HBOT Entered By: [name] on 10/23/2019 14:47:08

## 2019-10-23 NOTE — Progress Notes (Signed)
[name], [name] ([id number]) Visit Report for 10/23/2019 SuperBill Details Patient Name: Date of Service: [name], [name] 10/23/2019 Medical Record Number:[id number] Patient Account Number: [id number] Date of Birth/Sex: Treating RN: 12/07/1940 (79 y.o. 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 other specified severity L97.528 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ity E11.42 Type 2 diabetes mellitus with diabetic polyneuropathy 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, 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 Facility Procedures The patient participates with Medicare or their insurance follows the Medicare Facility Guidelines CPT4 Code Description Modifier Quantity [id number] G0277-(Facility Use Only) HBOT, full body chamber, 35min 4 Physician Procedures CPT4 Code Description Modifier Quantity [id number] 13244 - WC PHYS HYPERBARIC OXYGEN THERAPY 1 ICD-10 Diagnosis Description E11.621 Type 2 diabetes mellitus with foot ulcer Electronic Signature(s) Signed: 10/23/2019 4:26:31 PM By: [name] EMT/HBOT Signed: 10/23/2019 5:55:42 PM By: [name] MD Entered By: [name] on 10/23/2019 16:25:35

## 2019-10-26 ENCOUNTER — Encounter: Payer: Self-pay | Admitting: Endocrinology

## 2019-10-26 ENCOUNTER — Other Ambulatory Visit: Payer: Self-pay

## 2019-10-26 ENCOUNTER — Encounter (HOSPITAL_BASED_OUTPATIENT_CLINIC_OR_DEPARTMENT_OTHER): Payer: Medicare Other | Admitting: Internal Medicine

## 2019-10-26 ENCOUNTER — Ambulatory Visit (INDEPENDENT_AMBULATORY_CARE_PROVIDER_SITE_OTHER): Payer: Medicare Other | Admitting: Endocrinology

## 2019-10-26 VITALS — BP 130/60 | HR 82 | Ht 64.5 in | Wt 142.6 lb

## 2019-10-26 DIAGNOSIS — D649 Anemia, unspecified: Secondary | ICD-10-CM | POA: Diagnosis not present

## 2019-10-26 DIAGNOSIS — L97518 Non-pressure chronic ulcer of other part of right foot with other specified severity: Secondary | ICD-10-CM | POA: Diagnosis not present

## 2019-10-26 DIAGNOSIS — E11319 Type 2 diabetes mellitus with unspecified diabetic retinopathy without macular edema: Secondary | ICD-10-CM | POA: Insufficient documentation

## 2019-10-26 DIAGNOSIS — E11621 Type 2 diabetes mellitus with foot ulcer: Secondary | ICD-10-CM | POA: Insufficient documentation

## 2019-10-26 DIAGNOSIS — Z4781 Encounter for orthopedic aftercare following surgical amputation: Secondary | ICD-10-CM | POA: Diagnosis not present

## 2019-10-26 DIAGNOSIS — Z86718 Personal history of other venous thrombosis and embolism: Secondary | ICD-10-CM | POA: Insufficient documentation

## 2019-10-26 DIAGNOSIS — L97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 limited to breakdown of skin: Secondary | ICD-10-CM | POA: Insufficient documentation

## 2019-10-26 DIAGNOSIS — E1159 Type 2 diabetes mellitus with other circulatory complications: Secondary | ICD-10-CM

## 2019-10-26 DIAGNOSIS — I10 Essential (primary) hypertension: Secondary | ICD-10-CM

## 2019-10-26 DIAGNOSIS — E1169 Type 2 diabetes mellitus with other specified complication: Secondary | ICD-10-CM | POA: Insufficient documentation

## 2019-10-26 DIAGNOSIS — E1152 Type 2 diabetes mellitus with diabetic peripheral angiopathy with gangrene: Secondary | ICD-10-CM | POA: Insufficient documentation

## 2019-10-26 DIAGNOSIS — L97516 Non-pressure chronic ulcer of other part of right foot with bone involvement without evidence of necrosis: Secondary | ICD-10-CM | POA: Diagnosis not present

## 2019-10-26 DIAGNOSIS — M869 Osteomyelitis, unspecified: Secondary | ICD-10-CM | POA: Diagnosis not present

## 2019-10-26 DIAGNOSIS — I6523 Occlusion and stenosis of bilateral carotid arteries: Secondary | ICD-10-CM

## 2019-10-26 DIAGNOSIS — M86371 Chronic multifocal osteomyelitis, right ankle and foot: Secondary | ICD-10-CM | POA: Diagnosis not present

## 2019-10-26 DIAGNOSIS — L97528 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ity: Secondary | ICD-10-CM | POA: Insufficient documentation

## 2019-10-26 DIAGNOSIS — E1142 Type 2 diabetes mellitus with diabetic polyneuropathy: Secondary | ICD-10-CM | POA: Diagnosis not present

## 2019-10-26 LAB — GLUCOSE, CAPILLARY
Glucose-Capillary: 165 mg/dL — ABNORMAL HIGH (ref 70–99)
Glucose-Capillary: 117 mg/dL — ABNORMAL HIGH (ref 70–99)

## 2019-10-26 NOTE — Progress Notes (Addendum)
[name], [name] ([id number]) Visit Report for 10/26/2019 SuperBill Details Patient Name: Date of Service: [name], [name] 10/26/2019 Medical Record Number:[id number] Patient Account Number: [id number] Date of Birth/Sex: Treating RN: 1940/11/20 (79 y.o. 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 other specified severity L97.528 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ity E11.42 Type 2 diabetes mellitus with diabetic polyneuropathy 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, 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 Facility Procedures The patient participates with Medicare or their insurance follows the Medicare Facility Guidelines CPT4 Code Description Modifier Quantity [id number] G0277-(Facility Use Only) HBOT, full body chamber, 60min 4 Physician Procedures CPT4 Code Description Modifier Quantity [id number] 17001 - WC PHYS HYPERBARIC OXYGEN THERAPY 1 ICD-10 Diagnosis Description 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

## 2019-10-26 NOTE — Progress Notes (Signed)
Patient ID: [name], female   DOB: 04/26/1941, 79 y.o.   MRN: [id number]          Reason for Appointment: Follow-up for endocrinology problems  Referring physician: Darcus Austin   History of Present Illness:          Date of diagnosis of type 2 diabetes mellitus: 1984       Background history:   She has been on metformin since about the time of her diagnosis and has been on other drugs also but has not had many changes in her regimen in the last several years She thinks her blood sugar used to be fairly well controlled At some point she was given Byetta for about a year and a half which improved her control but this was subsequently start because of it being too expensive for her.  She had no side effects with this  Recent history:   Her A1c is now down to 6.0, previously 7.1 on her last visit in 06/2019   Non-insulin hypoglycemic regimen: Glipizide ER 5 mg daily, Invokana 300 mg daily, Ozempic 0.5 mg weekly, metformin 1 g twice daily  Current management, blood sugar patterns and problems identified:  She has not taken her Ozempic for 2 weeks  She was told that at her hyperbaric treatments her blood sugar needs to be above at least 120 and since it has been usually lower than this she started doing her treatments in the afternoon as well as not taking Ozempic  However blood sugars have been in the 170s at times at her treatment facility  She has not checked her sugars after meals  Mostly checking them in the morning and fasting blood sugars are as low as 89  She has gained back some of the weight she has lost last year  She has tried to increase her protein intake to help with wound healing at all meals        Side effects from medications have been: None  Compliance with the medical regimen: Good Hypoglycemia:   None  Glucose monitoring:  About 1 times a day         Glucometer: One Touch.       Blood Glucose readings by time of day as above   PRE-MEAL Fasting  Lunch Dinner Bedtime Overall  Glucose range:  88-132  120     Mean/median:     106    Previous readings:  PRE-MEAL Fasting Lunch Dinner Bedtime Overall  Glucose range:  132-212     119-233  Mean/median:  162     166   POST-MEAL PC Breakfast PC Lunch PC Dinner  Glucose range:   131-181   Mean/median:   163        Self-care: The diet that the patient has been following is: tries to limit sweets and fried food.     Meal times are:  Breakfast is at 8:30 AM lunch: 1 PM, dinner: 6:30 PM  Typical meal intake: Breakfast is yogurt with fruit or egg/toast or oatmeal Trying to get some boost with protein  Lunch is usually soup with crackers, dinner is usually a low-fat meat with vegetables Snacks will be celery and ranch dressing               Dietician visit, most recent: 10/18               Exercise: not Walking  Weight history:  Wt Readings from Last 3 Encounters:  10/26/19  142 lb 9.6 oz (64.7 kg)  10/06/19 135 lb (61.2 kg)  10/01/19 135 lb 3.2 oz (61.3 kg)    Glycemic control:   Lab Results  Component Value Date   HGBA1C 6.0 10/12/2019   HGBA1C 6.7 (H) 07/21/2019   HGBA1C 7.1 (H) 06/29/2019   Lab Results  Component Value Date   MICROALBUR 2.0 (H) 06/29/2019   LDLCALC 53 06/29/2019   CREATININE 0.71 10/12/2019   Lab Results  Component Value Date   MICRALBCREAT 2.2 06/29/2019    Lab Results  Component Value Date   FRUCTOSAMINE 243 03/18/2019   FRUCTOSAMINE 268 10/21/2018   FRUCTOSAMINE 267 05/30/2018      Allergies as of 10/26/2019      Reactions   Bactrim [sulfamethoxazole-trimethoprim] Other (See Comments)   ^ K+( elevated)    Demerol [meperidine]    Delusional    Scopolamine    Delusional    Versed [midazolam] Anxiety   Frantic, out of my mind, agitated       Medication List       Accurate as of October 26, 2019 12:08 PM. If you have any questions, ask your nurse or doctor.        STOP taking these medications   atorvastatin 20 MG  tablet Commonly known as: LIPITOR Stopped by: Elayne Snare, MD   doxycycline 100 MG tablet Commonly known as: VIBRA-TABS Stopped by: Elayne Snare, MD   fish oil-omega-3 fatty acids 1000 MG capsule Stopped by: Elayne Snare, MD   nitroGLYCERIN 0.2 mg/hr patch Commonly known as: Nitro-Dur Stopped by: Elayne Snare, MD   oxyCODONE 5 MG immediate release tablet Commonly known as: Oxy IR/ROXICODONE Stopped by: Elayne Snare, MD   Ozempic (0.25 or 0.5 MG/DOSE) 2 MG/1.5ML Sopn Generic drug: Semaglutide(0.25 or 0.5MG /DOS) Stopped by: Elayne Snare, MD     TAKE these medications   acetaminophen 500 MG tablet Commonly known as: TYLENOL Take 1,000 mg by mouth every 6 (six) hours as needed for moderate pain or headache.   aspirin 81 MG tablet Take 81 mg by mouth every evening.   calcium carbonate 500 MG chewable tablet Commonly known as: TUMS - dosed in mg elemental calcium Chew 2 tablets by mouth daily as needed for indigestion or heartburn.   CINNAMON PO Take 1,000 mg by mouth 3 (three) times daily with meals.   clopidogrel 75 MG tablet Commonly known as: PLAVIX Take 1 tablet by mouth once daily   diltiazem 240 MG 24 hr capsule Commonly known as: Cartia XT Take 1 capsule (240 mg total) by mouth daily.   glipiZIDE 5 MG 24 hr tablet Commonly known as: GLUCOTROL XL Take 1 tablet by mouth once daily with breakfast   hydrochlorothiazide 25 MG tablet Commonly known as: HYDRODIURIL Take 1 tablet (25 mg total) by mouth daily.   Invokana 100 MG Tabs tablet Generic drug: canagliflozin TAKE 1 TABLET BY MOUTH BEFORE BREAKFAST   Magnesium 250 MG Tabs Take 250 mg by mouth daily with supper.   metFORMIN 1000 MG tablet Commonly known as: GLUCOPHAGE Take 1 tablet (1,000 mg total) by mouth 2 (two) times daily.   metoprolol tartrate 100 MG tablet Commonly known as: LOPRESSOR Take half tab in the morning and a whole tab in the evening. What changed:   how much to take  how to take  this  when to take this  additional instructions   pantoprazole 40 MG tablet Commonly known as: PROTONIX Take 1 tablet (40 mg total) by mouth daily.  telmisartan 80 MG tablet Commonly known as: MICARDIS Take 1 tablet (80 mg total) by mouth daily. What changed:   how much to take  when to take this   vitamin B-12 1000 MCG tablet Commonly known as: CYANOCOBALAMIN Take 1,000 mcg by mouth daily with supper.   vitamin C 1000 MG tablet Take 1,000 mg by mouth daily with supper.       Allergies:  Allergies  Allergen Reactions  . Bactrim [Sulfamethoxazole-Trimethoprim] Other (See Comments)    ^ K+( elevated)   . Demerol [Meperidine]     Delusional   . Scopolamine     Delusional   . Versed [Midazolam] Anxiety    Frantic, out of my mind, agitated     Past Medical History:  Diagnosis Date  . Arthritis   . Cancer ([place])    removal from nose - MOSE procedure   . Complication of anesthesia    VERSED- agitated, muscle spasms, "jerking" , frantic , (never had this occurence in the pas)   . Diabetes mellitus without complication ([place])   . GERD (gastroesophageal reflux disease)   . Heart murmur   . History of hiatal hernia   . Hyperlipidemia   . Hypertension 11/20/11   ECHO- EF>55% Borderline concentric left ventricular hypertrophy. There is a small calcified mass in the L:A near the LA appendage. No valvular masses seen with associated mitral annular calcification. LA Volume/ BSA27.4 ml/m2 No AS. Right ventricular systolic pressure is elevated at 22mmHg.  Marland Kitchen Peripheral vascular disease ([place])   . Pneumonia    not hosp.     Past Surgical History:  Procedure Laterality Date  . ABDOMINAL AORTOGRAM N/A 06/15/2019   Procedure: ABDOMINAL AORTOGRAM;  Surgeon: [name], MD;  Location: [facility];  Service: Cardiovascular;  Laterality: N/A;  . APPENDECTOMY    . APPLICATION OF WOUND VAC Right 07/21/2019   Procedure: Application Of Prevena Wound Vac Right Groin;   Surgeon: [name], MD;  Location: [facility];  Service: Vascular;  Laterality: Right;  . CARPAL TUNNEL RELEASE    . CARPAL TUNNEL RELEASE    . CESAREAN SECTION     x 2  . COLONOSCOPY    . ENDARTERECTOMY FEMORAL Right 07/21/2019   Procedure: RIGHT ENDARTERECTOMY FEMORAL WITH PATCH ANGIOPLASTY;  Surgeon: [name], MD;  Location: [facility];  Service: Vascular;  Laterality: Right;  . LOWER EXTREMITY ANGIOGRAPHY Bilateral 06/15/2019   Procedure: Lower Extremity Angiography;  Surgeon: [name], MD;  Location: [facility];  Service: Cardiovascular;  Laterality: Bilateral;  . LOWER EXTREMITY ANGIOGRAPHY Right 08/03/2019   Procedure: LOWER EXTREMITY ANGIOGRAPHY;  Surgeon: [name], MD;  Location: [facility];  Service: Cardiovascular;  Laterality: Right;  . PATCH ANGIOPLASTY Right 07/21/2019   Procedure: Patch Angioplasty Right Femoral Artery;  Surgeon: [name], MD;  Location: [facility];  Service: Vascular;  Laterality: Right;  . PERIPHERAL VASCULAR ATHERECTOMY  08/03/2019   Procedure: PERIPHERAL VASCULAR ATHERECTOMY;  Surgeon: [name], MD;  Location: [facility];  Service: Cardiovascular;;  right SFA, right TP trunk  . PERIPHERAL VASCULAR BALLOON ANGIOPLASTY Left 06/15/2019   Procedure: PERIPHERAL VASCULAR BALLOON ANGIOPLASTY;  Surgeon: [name], MD;  Location: [facility];  Service: Cardiovascular;  Laterality: Left;  SFA UNSUCCESSFUL UNABLE TO CROSS LESION  . PERIPHERAL VASCULAR BALLOON ANGIOPLASTY  08/03/2019   Procedure: PERIPHERAL VASCULAR BALLOON ANGIOPLASTY;  Surgeon: [name], MD;  Location: [facility]  INVASIVE [facility];  Service: Cardiovascular;;  right SFA, Right TP trunk  . TONSILLECTOMY     and adenoidectomy  . 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 TUBAL LIGATION      Family  History  Problem Relation Age of Onset  . Cancer - Prostate Father   . Cancer - Colon Father   . Stroke Mother   . Hypertension Mother   . Hyperlipidemia Mother   . Melanoma Brother     Social History:  reports that she quit smoking about 47 years ago. She has never used smokeless tobacco. She reports that she does not drink alcohol or use drugs.   Review of Systems   Lipid history: Hypercholesterolemia by PCP with 20 mg Lipitor    Lab Results  Component Value Date   CHOL 118 06/29/2019   HDL 33.40 (L) 06/29/2019   LDLCALC 53 06/29/2019   TRIG 158.0 (H) 06/29/2019   CHOLHDL 4 06/29/2019           Hypertension: Present for several years, currently treated with  Micardis 40 mg, metoprolol, diltiazem and HCTZ, also followed by cardiologist Micardis was reduced previously because of low normal blood pressure readings  Also on 100 mg Invokana, renal function stable  BP Readings from Last 3 Encounters:  10/26/19 130/60  10/06/19 (!) 144/62  10/01/19 (!) 148/74   Lab Results  Component Value Date   CREATININE 0.71 10/12/2019   CREATININE 0.92 09/23/2019   CREATININE 0.93 08/08/2019     Most recent eye exam was in 9/19  Most recent foot exam: At wound care center  She has had significant PAD and followed by vascular cardiologist, still has some issues on the left side   LABS:  Office Visit on 10/23/2019  Component Date Value Ref Range Status  . Glucose-Capillary 10/23/2019 142* 70 - 99 mg/dL Final  . Glucose-Capillary 10/23/2019 147* 70 - 99 mg/dL Final  Office Visit on 10/22/2019  Component Date Value Ref Range Status  . Glucose-Capillary 10/22/2019 178* 70 - 99 mg/dL Final  Office Visit on 10/22/2019  Component Date Value Ref Range Status  . Glucose-Capillary 10/22/2019 130* 70 - 99 mg/dL Final  Office Visit on 10/21/2019  Component Date Value Ref Range Status  . Glucose-Capillary 10/21/2019 157* 70 - 99 mg/dL Final  . Glucose-Capillary 10/21/2019 138* 70  - 99 mg/dL Final  Office Visit on 10/20/2019  Component Date Value Ref Range Status  . Glucose-Capillary 10/20/2019 172* 70 - 99 mg/dL Final  . Glucose-Capillary 10/20/2019 176* 70 - 99 mg/dL Final    Physical Examination:  BP 130/60 (BP Location: Right Arm, Patient Position: Sitting, Cuff Size: Normal)   Pulse 82   Ht 5' 4.5" (1.638 m)   Wt 142 lb 9.6 oz (64.7 kg)   SpO2 96%   BMI 24.10 kg/m       ASSESSMENT:  Diabetes type 2  See history of present illness for detailed discussion of current diabetes management, blood sugar patterns and problems identified  Her A1c is improved at 6% She is on multiple drugs including Ozempic and Invokana She has however not taken her Ozempic recently  Blood sugars only been monitored in the morning As above her blood sugars are being raised to allow her to get into the hyperbaric chamber and blood sugars have been as high as 178 postprandially However fasting readings have been  as low as 89 at home  Hypertension: Blood pressure is well controlled with above regimen Also no interaction with Invokana 100 mg, renal function and potassium stable   She needs follow-up eye exam and she can schedule when she is able to  Lipids: Followed by other specialists, she is currently leaving atorvastatin off because of reported interaction with her antibiotics   PLAN:    Leave off glipizide and restart Ozempic in about 2 days For now she can try 0.25 mg weekly for the first month and then 0.5 If her blood sugars are much higher in the mornings may consider 2.5 mg of glipizide ER; however if she maintains her level of control may not need this long-term No change in Invokana  Discussed need to monitor readings after meals more and not just in the mornings Unless her blood sugars are not controlled she can follow-up in 3 months    Patient Instructions  Take Ozempic 0.25 for 4 weeks and then 0.5mg   Leave off Glipizide till after hyperbaric  Rx  Check blood sugars on waking up 3 days a week  Also check blood sugars about 2 hours after meals and do this after different meals by rotation  Recommended blood sugar levels on waking up are 90-130 and about 2 hours after meal is 130-160  Please bring your blood sugar monitor to each visit, thank you      Elayne Snare 10/26/2019, 12:08 PM   Note: This office note was prepared with Dragon voice recognition system technology. Any transcriptional errors that result from this process are unintentional.

## 2019-10-26 NOTE — Progress Notes (Signed)
[name], [name] ([id number]) Visit Report for 10/26/2019 Arrival Information Details Patient Name: Date of Service: [name], [name] 10/26/2019 2:00 PM Medical Record Number:[id number] Patient Account Number: [id number] Date of Birth/Sex: Treating RN: [birth year] (79 y.o. [name] Primary Care [name]: [name] Other Clinician: Referring [name]: Treating [name]/[name]: [name] in Treatment: 15 Visit Information History Since Last Visit Added or deleted any medications: No Patient Arrived: [place] Any new allergies or adverse reactions: No Arrival Time: 13:57 Had a fall or experienced change in No Accompanied By: self activities of daily living that may affect Transfer Assistance: None risk of falls: Patient Identification Verified: Yes Signs or symptoms of abuse/neglect since last No Secondary Verification Process Yes visito Completed: Hospitalized since last visit: No Patient Requires Transmission- No Implantable device outside of the clinic excluding No Based Precautions: cellular tissue based products placed in the center Patient Has Alerts: Yes since last visit: Patient Alerts: Right ABI .46 TBI Has Dressing in Place as Prescribed: Yes refused Pain Present Now: No Left ABI .60 TBI .14 Electronic Signature(s) Signed: 10/26/2019 6:01:21 PM By: [name] Entered By: [name] on 10/26/2019 14:29:20 -------------------------------------------------------------------------------- Encounter Discharge Information Details Patient Name: Date of Service: [name] 10/26/2019 2:00 PM Medical Record Number:[id number] Patient Account Number: [id number] Date of Birth/Sex: Treating RN: 1940-11-16 (79 y.o. [name] Primary Care [name]: [name] Other Clinician: Referring [name]: Treating [name]/[name]: [name] in Treatment: 15 Encounter Discharge Information Items Discharge  Condition: Stable Ambulatory Status: Walker Discharge Destination: Home Transportation: Private Auto Accompanied By: self Schedule Follow-up Appointment: Yes Clinical Summary of Care: Electronic Signature(s) Signed: 10/26/2019 6:01:21 PM By: [name] Entered By: [name] on 10/26/2019 16:40:07 -------------------------------------------------------------------------------- Vitals Details Patient Name: Date of Service: [name] 10/26/2019 2:00 PM Medical Record Number:[id number] Patient Account Number: [id number] Date of Birth/Sex: Treating RN: 05/03/1941 (79 y.o. [name], [name] Primary Care Perl Kerney: [name] Other Clinician: Referring [name]: Treating Kealey Kemmer/[name]: [name] in Treatment: 15 Vital Signs Time Taken: 13:57 Temperature (F): 98 Height (in): 64 Pulse (bpm): 84 Weight (lbs): 142 Respiratory Rate (breaths/min): 20 Body Mass Index (BMI): 24.4 Blood Pressure (mmHg): 138/50 Capillary Blood Glucose (mg/dl): 165 Reference Range: 80 - 120 mg / dl Electronic Signature(s) Signed: 10/26/2019 6:01:21 PM By: [name] Entered By: [name] on 10/26/2019 14:30:35

## 2019-10-26 NOTE — Patient Instructions (Addendum)
Take Ozempic 0.25 for 4 weeks and then 0.5mg   Leave off Glipizide till after hyperbaric Rx  Check blood sugars on waking up 3 days a week  Also check blood sugars about 2 hours after meals and do this after different meals by rotation  Recommended blood sugar levels on waking up are 90-130 and about 2 hours after meal is 130-160  Please bring your blood sugar monitor to each visit, thank you

## 2019-10-27 ENCOUNTER — Encounter (HOSPITAL_BASED_OUTPATIENT_CLINIC_OR_DEPARTMENT_OTHER): Payer: Medicare Other | Admitting: Internal Medicine

## 2019-10-27 DIAGNOSIS — L97528 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ity: Secondary | ICD-10-CM | POA: Diagnosis not present

## 2019-10-27 DIAGNOSIS — L97518 Non-pressure chronic ulcer of other part of right foot with other specified severity: Secondary | ICD-10-CM | POA: Diagnosis not present

## 2019-10-27 DIAGNOSIS — E11621 Type 2 diabetes mellitus with foot ulcer: Secondary | ICD-10-CM | POA: Diagnosis not present

## 2019-10-27 DIAGNOSIS — E1142 Type 2 diabetes mellitus with diabetic polyneuropathy: Secondary | ICD-10-CM | POA: Diagnosis not present

## 2019-10-27 DIAGNOSIS — E1152 Type 2 diabetes mellitus with diabetic peripheral angiopathy with gangrene: Secondary | ICD-10-CM | POA: Diagnosis not present

## 2019-10-27 DIAGNOSIS — L97514 Non-pressure chronic ulcer of other part of right foot with necrosis of bone: Secondary | ICD-10-CM | POA: Diagnosis not present

## 2019-10-27 DIAGNOSIS — L97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 limited to breakdown of skin: Secondary | ICD-10-CM | POA: Diagnosis not present

## 2019-10-27 LAB — GLUCOSE, CAPILLARY
Glucose-Capillary: 137 mg/dL — ABNORMAL HIGH (ref 70–99)
Glucose-Capillary: 120 mg/dL — ABNORMAL HIGH (ref 70–99)

## 2019-10-27 NOTE — Progress Notes (Signed)
[name], [name] ([id number]) Visit Report for 10/27/2019 SuperBill Details Patient Name: Date of Service: [name], [name] 10/27/2019 Medical Record Number:[id number] Patient Account Number: [id number] Date of Birth/Sex: Treating RN: 1941/01/14 (79 y.o. 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, 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 Facility Procedures The patient participates with Medicare or their insurance follows the Medicare Facility Guidelines CPT4 Code Description Modifier Quantity [id number] G0277-(Facility Use Only) HBOT, full body chamber, 71min 4 Physician Procedures CPT4 Code Description Modifier Quantity 1224497 [id number] - WC PHYS HYPERBARIC OXYGEN THERAPY 1 ICD-10 Diagnosis Description E11.621 Type 2 diabetes mellitus with foot ulcer Electronic Signature(s) Signed: 10/27/2019 5:19:10 PM By: [name] EMT/HBOT Signed: 10/27/2019 5:22:39 PM By: [name] MD Entered By: [name] on 10/27/2019 17:18:20

## 2019-10-27 NOTE — Progress Notes (Addendum)
[name], [name] ([id number]) Visit Report for 10/27/2019 HBO Details Patient Name: Date of Service: [name], [name] 10/27/2019 2:00 PM Medical Record Number:[id number] Patient Account Number: [id number] Date of Birth/Sex: Treating RN: 07-17-1941 (79 y.o. F) Primary Care Letasha Kershaw: [name] Other Clinician: [name] Referring Luverta Korte: Treating Tyvon Eggenberger/[name]:[name], [name], [name] Weeks in Treatment: 15 HBO Treatment Course Details Treatment Course Number: 1 Ordering Markela Wee: [name] Total Treatments Ordered: 40 HBO Treatment Start Date: 10/12/2019 HBO Indication: Diabetic Ulcer(s) of the Lower Extremity HBO Treatment Details Treatment Number: 12 Patient Type: Outpatient Chamber Type: Monoplace Chamber Serial #: [id number] Treatment Protocol: 2.5 ATA with 90 minutes oxygen, with two 5 minute air breaks Treatment Details Compression Rate Down: 2.0 psi / minute De-Compression Rate Up: 2.0 psi / minute Air breaks and CompressTx Pressure breathing periods DecompressDecompress Begins Reached (leave unused spaces Begins Ends blank) Chamber Pressure (ATA)1 2.5 2.5 2.5 2.5 2.5 --2.5 1 Clock Time (24 hr) 14:15 14:27 [id number]:72--62:03 16:19 Treatment Length: 124 (minutes) Treatment Segments: 4 Vital Signs Capillary Blood Glucose Reference Range: 80 - 120 mg / dl HBO Diabetic Blood Glucose Intervention Range: <131 mg/dl or >249 mg/dl Time Vitals Blood Respiratory Capillary Blood Glucose Pulse Action Type: Pulse: Temperature: Taken: Pressure: Rate: Glucose (mg/dl): Meter #: Oximetry (%) Taken: Pre 14:00 145/47 85 14 97.8 137 Post 16:21 150/54 83 15 98 120 Treatment Response Treatment Toleration: Well Treatment Completion Treatment Completed without Adverse Event Status: Additional Procedure Documentation Tissue Sevierity: Necrosis of bone Electronic Signature(s) Signed: 10/27/2019 5:19:10 PM By: [name] EMT/HBOT Signed:  10/27/2019 5:22:39 PM By: [name] MD Entered By: [name] on 10/27/2019 17:18:14 -------------------------------------------------------------------------------- HBO Safety Checklist Details Patient Name: Date of Service: [name] 10/27/2019 2:00 PM Medical Record Number:[id number] Patient Account Number: [id number] Date of Birth/Sex: Treating RN: 05-01-1941 (79 y.o. F) Primary Care Lawson Isabell: [name] Other Clinician: [name] Referring Evangela Heffler: Treating Jennessa Trigo/[name]:[name], [name], [name] Weeks in Treatment: 15 HBO Safety Checklist Items Safety Checklist Consent Form Signed Patient voided / foley secured and emptied When did you last eato 1200 - PBandJ sammy Last dose of injectable or oral agent metformin NA Ostomy pouch emptied and vented if applicable All implantable devices assessed, documented and approved Intravenous access site secured and place PICC line left arm Valuables secured Linens and cotton and cotton/polyester blend (less than 51% polyester) Personal oil-based products / skin lotions / body lotions removed Wigs or hairpieces removed NA Smoking or tobacco materials removed NA Books / newspapers / magazines / loose paper removed Cologne, aftershave, perfume and deodorant removed Jewelry removed (may wrap wedding band) Make-up removed Hair care products removed NA Battery operated devices (external) removed NA Heating patches and chemical warmers removed NA Titanium eyewear removed Nail polish cured greater than 10 hours NA Casting material cured greater than 10 hours NA Hearing aids removed NA Loose dentures or partials removed NA Prosthetics have been removed Patient demonstrates correct use of air break device (if applicable) Patient concerns have been addressed Patient grounding bracelet on and cord attached to chamber Specifics for Inpatients (complete in addition to above) Medication sheet sent with  patient Intravenous medications needed or due during therapy sent with patient Drainage tubes (e.g. nasogastric tube or chest tube secured and vented) Endotracheal or Tracheotomy tube secured Cuff deflated of air and inflated with saline Airway suctioned Electronic Signature(s) Signed: 10/27/2019 2:05:16 PM By: [name] EMT/HBOT Entered By: [name] on 10/27/2019 14:05:15

## 2019-10-27 NOTE — Progress Notes (Signed)
[name], [name] ([id number]) Visit Report for 10/27/2019 Arrival Information Details Patient Name: Date of Service: [name], [name] 10/27/2019 2:00 PM Medical Record Number:[id number] Patient Account Number: [id number] Date of Birth/Sex: Treating RN: 1940/12/28 (79 y.o. F) Primary Care Kasin Tonkinson: [name] Other Clinician: [name] Referring Floyed Masoud: Treating Tonnia Bardin/[name]:[name], [name], [name] Weeks in Treatment: 15 Visit Information History Since Last Visit Added or deleted any medications: No Patient Arrived: [name]: No Arrival Time: 13:55 Had a fall or experienced change in No Accompanied By: self activities of daily living that may affect Transfer Assistance: None risk of falls: Patient Identification Verified: Yes Signs or symptoms of abuse/neglect since last No Secondary Verification Process Yes visito Completed: Hospitalized since last visit: No Patient Requires Transmission- No Implantable device outside of the clinic excluding No Based Precautions: cellular tissue based products placed in the center Patient Has Alerts: Yes since last visit: Patient Alerts: Right ABI .62 TBI Pain Present Now: No refused Left ABI .60 TBI .14 Electronic Signature(s) Signed: 10/27/2019 5:19:10 PM By: [name] EMT/HBOT Entered By: [name] on 10/27/2019 14:03:53 -------------------------------------------------------------------------------- Encounter Discharge Information Details Patient Name: Date of Service: [name] 10/27/2019 2:00 PM Medical Record Number:[id number] Patient Account Number: [id number] Date of Birth/Sex: Treating RN: 1941-09-19 (79 y.o. F) Primary Care Onie Hayashi: [name] Other Clinician: [name] Referring Ethelene Closser: Treating Lucetta Baehr/[name]:[name], [name], [name] Weeks in Treatment: 15 Encounter Discharge Information Items Discharge  Condition: Stable Ambulatory Status: Walker Discharge Destination: Home Transportation: Private Auto Accompanied By: self Schedule Follow-up Appointment: Yes Clinical Summary of Care: Patient Declined Electronic Signature(s) Signed: 10/27/2019 5:19:10 PM By: [name] EMT/HBOT Entered By: [name] on 10/27/2019 17:18:47 -------------------------------------------------------------------------------- Patient/Caregiver Education Details Patient Name: Date of Service: [name] 2/2/2021andnbsp2:00 PM Medical Record Number:[id number] Patient Account Number: [id number] Date of Birth/Gender: Treating RN: December 21, 1940 (78 y.o. F) Primary Care Physician: [name] Other Clinician: [name] Referring Physician: Treating Physician/[name]:[name], [name], [name] in Treatment: 15 Education Assessment Education Provided To: Patient Education Topics Provided Hyperbaric Oxygenation: Methods: Explain/Verbal Responses: State content correctly Electronic Signature(s) Signed: 10/27/2019 5:19:10 PM By: [name] EMT/HBOT Entered By: [name] on 10/27/2019 17:18:32 -------------------------------------------------------------------------------- Vitals Details Patient Name: Date of Service: [name] 10/27/2019 2:00 PM Medical Record Number:[id number] Patient Account Number: [id number] Date of Birth/Sex: Treating RN: Feb 23, 1941 (79 y.o. F) Primary Care Demarques Pilz: [name] Other Clinician: [name] Referring Zaida Reiland: Treating Nakiyah Beverley/[name]:[name], [name], [name] Weeks in Treatment: 15 Vital Signs Time Taken: 14:00 Temperature (F): 97.8 Height (in): 64 Pulse (bpm): 85 Weight (lbs): 142 Respiratory Rate (breaths/min): 14 Body Mass Index (BMI): 24.4 Blood Pressure (mmHg): 145/47 Capillary Blood Glucose (mg/dl): 137 Reference Range: 80 - 120 mg / dl Electronic Signature(s) Signed: 10/27/2019  5:19:10 PM By: [name] EMT/HBOT Entered By: [name] on 10/27/2019 14:04:19

## 2019-10-28 ENCOUNTER — Encounter (HOSPITAL_BASED_OUTPATIENT_CLINIC_OR_DEPARTMENT_OTHER): Payer: Medicare Other | Admitting: Physician Assistant

## 2019-10-28 ENCOUNTER — Other Ambulatory Visit: Payer: Self-pay

## 2019-10-28 DIAGNOSIS — Z4781 Encounter for orthopedic aftercare following surgical amputation: Secondary | ICD-10-CM | POA: Diagnosis not present

## 2019-10-28 DIAGNOSIS — L97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 limited to breakdown of skin: Secondary | ICD-10-CM | POA: Diagnosis not present

## 2019-10-28 DIAGNOSIS — E1142 Type 2 diabetes mellitus with diabetic polyneuropathy: Secondary | ICD-10-CM | POA: Diagnosis not present

## 2019-10-28 DIAGNOSIS — I10 Essential (primary) hypertension: Secondary | ICD-10-CM | POA: Diagnosis not present

## 2019-10-28 DIAGNOSIS — L97514 Non-pressure chronic ulcer of other part of right foot with necrosis of bone: Secondary | ICD-10-CM | POA: Diagnosis not present

## 2019-10-28 DIAGNOSIS — E11621 Type 2 diabetes mellitus with foot ulcer: Secondary | ICD-10-CM | POA: Diagnosis not present

## 2019-10-28 DIAGNOSIS — E1152 Type 2 diabetes mellitus with diabetic peripheral angiopathy with gangrene: Secondary | ICD-10-CM | POA: Diagnosis not present

## 2019-10-28 DIAGNOSIS — L97528 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ity: Secondary | ICD-10-CM | POA: Diagnosis not present

## 2019-10-28 DIAGNOSIS — D649 Anemia, unspecified: Secondary | ICD-10-CM | POA: Diagnosis not present

## 2019-10-28 DIAGNOSIS — L97518 Non-pressure chronic ulcer of other part of right foot with other specified severity: Secondary | ICD-10-CM | POA: Diagnosis not present

## 2019-10-28 LAB — GLUCOSE, CAPILLARY
Glucose-Capillary: 132 mg/dL — ABNORMAL HIGH (ref 70–99)
Glucose-Capillary: 142 mg/dL — ABNORMAL HIGH (ref 70–99)

## 2019-10-28 NOTE — Progress Notes (Signed)
RAYOLA, EVERHART ([id number]) Visit Report for 10/28/2019 Arrival Information Details Patient Name: Date of Service: [name], [name] 10/28/2019 2:00 PM Medical Record Number:[id number] Patient Account Number: [id number] Date of Birth/Sex: Treating RN: 22-Jun-1941 (79 y.o. F) Primary Care Merlin Ege: [name] Other Clinician: [name] Referring Wretha Laris: Treating Ziquan Fidel/[name]:Stone III, Alen Bleacher, [name] Weeks in Treatment: 15 Visit Information History Since Last Visit Added or deleted any medications: No Patient Arrived: Walker Any new allergies or adverse reactions: No Arrival Time: 13:55 Had a fall or experienced change in No Accompanied By: self activities of daily living that may affect Transfer Assistance: None risk of falls: Patient Identification Verified: Yes Signs or symptoms of abuse/neglect since last No Secondary Verification Process Yes visito Completed: Hospitalized since last visit: No Patient Requires Transmission- No Implantable device outside of the clinic excluding No Based Precautions: cellular tissue based products placed in the center Patient Has Alerts: Yes since last visit: Patient Alerts: Right ABI .62 TBI Pain Present Now: No refused Left ABI .60 TBI .14 Electronic Signature(s) Signed: 10/28/2019 4:30:25 PM By: [name] EMT/HBOT Entered By: [name] on 10/28/2019 14:19:08 -------------------------------------------------------------------------------- Encounter Discharge Information Details Patient Name: Date of Service: [name] 10/28/2019 2:00 PM Medical Record Number:[id number] Patient Account Number: [id number] Date of Birth/Sex: Treating RN: [birth year] (79 y.o. F) Primary Care Taris Galindo: [name] Other Clinician: [name] Referring Nelida Mandarino: Treating Shareena Nusz/[name]:Stone III, Alen Bleacher, [name] Weeks in Treatment: 15 Encounter Discharge Information Items Discharge  Condition: Stable Ambulatory Status: Walker Discharge Destination: Home Transportation: Private Auto Accompanied By: self Schedule Follow-up Appointment: Yes Clinical Summary of Care: Patient Declined Electronic Signature(s) Signed: 10/28/2019 4:30:25 PM By: [name] EMT/HBOT Entered By: [name] on 10/28/2019 16:29:56 -------------------------------------------------------------------------------- Patient/Caregiver Education Details Patient Name: Date of Service: [name] 2/3/2021andnbsp2:00 PM Medical Record Number:[id number] Patient Account Number: [id number] Date of Birth/Gender: Treating RN: 1940/12/19 (79 y.o. F) Primary Care Physician: [name] Other Clinician: [name] Referring Physician: Treating Physician/[name]:Stone III, Alen Bleacher, [name] Weeks in Treatment: 15 Education Assessment Education Provided To: Patient Education Topics Provided Hyperbaric Oxygenation: Methods: Explain/Verbal Responses: State content correctly Electronic Signature(s) Signed: 10/28/2019 4:30:25 PM By: [name] EMT/HBOT Entered By: [name] on 10/28/2019 16:29:42 -------------------------------------------------------------------------------- Vitals Details Patient Name: Date of Service: [name] 10/28/2019 2:00 PM Medical Record Number:[id number] Patient Account Number: [id number] Date of Birth/Sex: Treating RN: 1941-07-23 (79 y.o. F) Primary Care Ivania Teagarden: [name] Other Clinician: [name] Referring Euretha Najarro: Treating Ayerim Berquist/[name]:Stone III, Alen Bleacher, [name] Weeks in Treatment: 15 Vital Signs Time Taken: 14:00 Temperature (F): 98.4 Height (in): 64 Pulse (bpm): 83 Weight (lbs): 142 Respiratory Rate (breaths/min): 17 Body Mass Index (BMI): 24.4 Blood Pressure (mmHg): 107/41 Capillary Blood Glucose (mg/dl): 132 Reference Range: 80 - 120 mg / dl Electronic Signature(s) Signed: 10/28/2019  4:30:25 PM By: [name] EMT/HBOT Entered By: [name] on 10/28/2019 14:19:26

## 2019-10-28 NOTE — Progress Notes (Signed)
[name], [name] ([id number]) Visit Report for 10/28/2019 Problem List Details Patient Name: Date of Service: [name], [name] 10/28/2019 2:00 PM Medical Record Number:[id number] Patient Account Number: [id number] Date of Birth/Sex: Treating RN: 23-Aug-1941 (79 y.o. 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 07/13/2019 No Yes with other specified severity L97.528 Non-pressure chronic ulcer of other part of left foot 07/13/2019 No Yes with other specified severity E11.42 Type 2 diabetes mellitus with diabetic polyneuropathy 07/13/2019 No Yes 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, 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 Facility Procedures The patient participates with Medicare or their insurance follows the Medicare Facility Guidelines: CPT4 Code Description Modifier Quantity [id number] G0277-(Facility Use Only) HBOT, full body chamber, 38min 4 Physician Procedures CPT4 Code Description: 4782956 21308 - WC PHYS HYPERBARIC OXYGEN THERAPY ICD-10 Diagnosis Description E11.621 Type 2 diabetes mellitus with foot ulcer Modifier: Quantity: 1 Electronic Signature(s) Signed: 10/28/2019 6:52:05 PM By: Worthy Keeler PA-C Previous Signature: 10/28/2019 4:30:25 PM Version By: [name] EMT/HBOT Entered By: Worthy Keeler on 10/28/2019 18:52:04

## 2019-10-28 NOTE — Progress Notes (Addendum)
[name], WARREN ([id number]) Visit Report for 10/28/2019 HBO Details Patient Name: Date of Service: [name], WALDROP 10/28/2019 2:00 PM Medical Record Number:[id number] Patient Account Number: [id number] Date of Birth/Sex: Treating RN: 1941-08-10 (79 y.o. F) Primary Care Lailany Enoch: [name] Other Clinician: [name] Referring Ethyle Tiedt: Treating Eswin Worrell/[name]:Stone III, Alen Bleacher, [name] Weeks in Treatment: 15 HBO Treatment Course Details Treatment Course Number: 1 Ordering Stanley Helmuth: [name] Total Treatments Ordered: 40 HBO Treatment Start Date: 10/12/2019 HBO Indication: Diabetic Ulcer(s) of the Lower Extremity HBO Treatment Details Treatment Number: 13 Patient Type: Outpatient Chamber Type: Monoplace Chamber Serial #: [id number] Treatment Protocol: 2.5 ATA with 90 minutes oxygen, with two 5 minute air breaks Treatment Details Compression Rate Down: 2.0 psi / minute De-Compression Rate Up: 2.0 psi / minute Air breaks and CompressTx Pressure breathing periods DecompressDecompress Begins Reached (leave unused spaces Begins Ends blank) Chamber Pressure (ATA)1 2.5 2.5 2.5 2.5 2.5 --2.5 1 Clock Time (24 hr) 14:14 14:26 14:5615:0115:3115:36--16:06 16:18 Treatment Length: 124 (minutes) Treatment Segments: 4 Vital Signs Capillary Blood Glucose Reference Range: 80 - 120 mg / dl HBO Diabetic Blood Glucose Intervention Range: <131 mg/dl or >249 mg/dl Time Vitals Blood Respiratory Capillary Blood Glucose Pulse Action Type: Pulse: Temperature: Taken: Pressure: Rate: Glucose (mg/dl): Meter #: Oximetry (%) Taken: Pre 14:00 107/41 83 17 98.4 132 Post 16:20 124/37 75 15 98.1 142 Treatment Response Treatment Toleration: Well Treatment Completion Treatment Completed without Adverse Event Status: Additional Procedure Documentation Tissue Sevierity: Necrosis of bone Physician HBO Attestation: I certify that I supervised this HBO treatment in accordance with  Medicare guidelines. A trained Yes Yes emergency response team is readily available per hospital policies and procedures. Continue HBOT as ordered. Yes Electronic Signature(s) Signed: 10/28/2019 6:44:02 PM By: Worthy Keeler PA-C Previous Signature: 10/28/2019 4:30:25 PM Version By: [name] EMT/HBOT Entered By: Worthy Keeler on 10/28/2019 18:44:02 -------------------------------------------------------------------------------- HBO Safety Checklist Details Patient Name: Date of Service: [name], LOW 10/28/2019 2:00 PM Medical Record Number:[id number] Patient Account Number: [id number] Date of Birth/Sex: Treating RN: 1941-07-07 (79 y.o. F) Primary Care Catheryne Deford: [name] Other Clinician: [name] Referring Masaye Gatchalian: Treating Lilliam Chamblee/[name]:Stone III, Alen Bleacher, [name] Weeks in Treatment: 15 HBO Safety Checklist Items Safety Checklist Consent Form Signed Patient voided / foley secured and emptied When did you last eato 1200 - [place] stew Last dose of injectable or oral agent metformin NA Ostomy pouch emptied and vented if applicable All implantable devices assessed, documented and approved Intravenous access site secured and place PICC line left arm Valuables secured Linens and cotton and cotton/polyester blend (less than 51% polyester) Personal oil-based products / skin lotions / body lotions removed Wigs or hairpieces removed NA Smoking or tobacco materials removed Books / newspapers / magazines / loose paper removed Cologne, aftershave, perfume and deodorant removed Jewelry removed (may wrap wedding band) Make-up removed Hair care products removed NA Battery operated devices (external) removed NA Heating patches and chemical warmers removed NA Titanium eyewear removed Nail polish cured greater than 10 hours NA Casting material cured greater than 10 hours NA Hearing aids removed NA Loose dentures or partials removed NA Prosthetics  have been removed Patient demonstrates correct use of air break device (if applicable) Patient concerns have been addressed Patient grounding bracelet on and cord attached to chamber Specifics for Inpatients (complete in addition to above) Medication sheet sent with patient Intravenous medications needed or due during therapy sent with patient Drainage tubes (e.g. nasogastric tube or chest tube secured and vented) Endotracheal or  Tracheotomy tube secured Cuff deflated of air and inflated with saline Airway suctioned Electronic Signature(s) Signed: 10/28/2019 2:20:27 PM By: [name] EMT/HBOT Entered By: [name] on 10/28/2019 14:20:27

## 2019-10-29 ENCOUNTER — Encounter (HOSPITAL_BASED_OUTPATIENT_CLINIC_OR_DEPARTMENT_OTHER): Payer: Medicare Other | Attending: Internal Medicine | Admitting: Internal Medicine

## 2019-10-29 DIAGNOSIS — L97518 Non-pressure chronic ulcer of other part of right foot with other specified severity: Secondary | ICD-10-CM | POA: Diagnosis not present

## 2019-10-29 DIAGNOSIS — E11621 Type 2 diabetes mellitus with foot ulcer: Secondary | ICD-10-CM | POA: Diagnosis not present

## 2019-10-29 DIAGNOSIS — E1152 Type 2 diabetes mellitus with diabetic peripheral angiopathy with gangrene: Secondary | ICD-10-CM | POA: Diagnosis not present

## 2019-10-29 DIAGNOSIS — L97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 limited to breakdown of skin: Secondary | ICD-10-CM | POA: Diagnosis not present

## 2019-10-29 DIAGNOSIS — L97514 Non-pressure chronic ulcer of other part of right foot with necrosis of bone: Secondary | ICD-10-CM | POA: Diagnosis not present

## 2019-10-29 DIAGNOSIS — E1142 Type 2 diabetes mellitus with diabetic polyneuropathy: Secondary | ICD-10-CM | POA: Diagnosis not present

## 2019-10-29 DIAGNOSIS — L97528 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ity: Secondary | ICD-10-CM | POA: Diagnosis not present

## 2019-10-29 LAB — GLUCOSE, CAPILLARY
Glucose-Capillary: 136 mg/dL — ABNORMAL HIGH (ref 70–99)
Glucose-Capillary: 156 mg/dL — ABNORMAL HIGH (ref 70–99)

## 2019-10-29 NOTE — Progress Notes (Addendum)
[name], [name] ([id number]) Visit Report for 10/29/2019 HBO Details Patient Name: Date of Service: [name], [name] 10/29/2019 2:00 PM Medical Record Number:[id number] Patient Account Number: [id number] Date of Birth/Sex: Treating RN: Nov 15, 1940 (79 y.o. F) Primary Care Jannatul Wojdyla: [name] Other Clinician: [name] Referring Gisella Alwine: Treating Kenaz Olafson/[name]:[name], [name], [name] Weeks in Treatment: 15 HBO Treatment Course Details Treatment Course Number: 1 Ordering Gabriella Woodhead: [name] Total Treatments Ordered: 40 HBO Treatment Start Date: 10/12/2019 HBO Indication: Diabetic Ulcer(s) of the Lower Extremity HBO Treatment Details Treatment Number: 14 Patient Type: Outpatient Chamber Type: Monoplace Chamber Serial #: [id number] Treatment Protocol: 2.5 ATA with 90 minutes oxygen, with two 5 minute air breaks Treatment Details Compression Rate Down: 2.0 psi / minute De-Compression Rate Up: 2.0 psi / minute Air breaks and CompressTx Pressure breathing periods DecompressDecompress Begins Reached (leave unused spaces Begins Ends blank) Chamber Pressure (ATA)1 2.5 2.5 2.5 2.5 2.5 --2.5 1 Clock Time (24 hr) 14:10 14:22 97:4163:8453:6468:03--21:22 16:14 Treatment Length: 124 (minutes) Treatment Segments: 4 Vital Signs Capillary Blood Glucose Reference Range: 80 - 120 mg / dl HBO Diabetic Blood Glucose Intervention Range: <131 mg/dl or >249 mg/dl Time Vitals Blood Respiratory Capillary Blood Glucose Pulse Action Type: Pulse: Temperature: Taken: Pressure: Rate: Glucose (mg/dl): Meter #: Oximetry (%) Taken: Pre 13:55 145/46 102 15 97.6 136 Post 16:17 141/64 85 14 98 156 Treatment Response Treatment Toleration: Well Treatment Completion Treatment Completed without Adverse Event Status: Additional Procedure Documentation Tissue Sevierity: Necrosis of bone Kambrie Eddleman Notes No concerns with treatment given Physician HBO Attestation: I certify that I  supervised this HBO treatment in accordance with Medicare guidelines. A trained Yes emergency response team is readily available per hospital policies and procedures. Continue HBOT as ordered. Yes Electronic Signature(s) Signed: 10/29/2019 5:50:02 PM By: [name] MD Previous Signature: 10/29/2019 4:25:19 PM Version By: [name] EMT/HBOT Entered By: [name] on 10/29/2019 17:48:20 -------------------------------------------------------------------------------- HBO Safety Checklist Details Patient Name: Date of Service: [name] 10/29/2019 2:00 PM Medical Record Number:[id number] Patient Account Number: [id number] Date of Birth/Sex: Treating RN: 02-Mar-1941 (79 y.o. F) Primary Care [name]: [name] Other Clinician: [name] Referring Mayerly Kaman: Treating Evia Goldsmith/[name]:[name], [name], [name] Weeks in Treatment: 15 HBO Safety Checklist Items Safety Checklist Consent Form Signed Patient voided / foley secured and emptied When did you last eato 1200 - [place] Last dose of injectable or oral agent metformin NA Ostomy pouch emptied and vented if applicable All implantable devices assessed, documented and approved Intravenous access site secured and place PICC line left arm Valuables secured Linens and cotton and cotton/polyester blend (less than 51% polyester) Personal oil-based products / skin lotions / body lotions removed Wigs or hairpieces removed NA Smoking or tobacco materials removed Books / newspapers / magazines / loose paper removed Cologne, aftershave, perfume and deodorant removed Jewelry removed (may wrap wedding band) Make-up removed Hair care products removed NA Battery operated devices (external) removed NA Heating patches and chemical warmers removed NA Titanium eyewear removed Nail polish cured greater than 10 hours NA Casting material cured greater than 10 hours NA Hearing aids removed NA Loose dentures  or partials removed NA Prosthetics have been removed Patient demonstrates correct use of air break device (if applicable) Patient concerns have been addressed Patient grounding bracelet on and cord attached to chamber Specifics for Inpatients (complete in addition to above) Medication sheet sent with patient Intravenous medications needed or due during therapy sent with patient Drainage tubes (e.g. nasogastric tube or chest tube secured and vented) Endotracheal  or Tracheotomy tube secured Cuff deflated of air and inflated with saline Airway suctioned Electronic Signature(s) Signed: 10/29/2019 2:31:07 PM By: [name] EMT/HBOT Entered By: [name] on 10/29/2019 14:31:06

## 2019-10-29 NOTE — Progress Notes (Signed)
[name], [name] ([id number]) Visit Report for 10/29/2019 SuperBill Details Patient Name: Date of Service: [name], [name] 10/29/2019 Medical Record Number:[id number] Patient Account Number: [id number] Date of Birth/Sex: Treating RN: [birth year] (79 y.o. 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, 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 Facility Procedures The patient participates with Medicare or their insurance follows the Medicare Facility Guidelines CPT4 Code Description Modifier Quantity [id number] G0277-(Facility Use Only) HBOT, full body chamber, 46min 4 Physician Procedures CPT4 Code Description Modifier Quantity [id number] 69629 - WC PHYS HYPERBARIC OXYGEN THERAPY 1 ICD-10 Diagnosis Description E11.621 Type 2 diabetes mellitus with foot ulcer Electronic Signature(s) Signed: 10/29/2019 4:25:19 PM By: [name] EMT/HBOT Signed: 10/29/2019 5:50:02 PM By: [name] MD Entered By: [name] on 10/29/2019 16:24:16

## 2019-10-29 NOTE — Progress Notes (Signed)
[name], [name] ([id number]) Visit Report for 10/29/2019 Arrival Information Details Patient Name: Date of Service: [name], [name] 10/29/2019 2:00 PM Medical Record Number:[id number] Patient Account Number: [id number] Date of Birth/Sex: Treating RN: 1940/10/14 (79 y.o. F) Primary Care Margarite Vessel: [name] Other Clinician: [name] Referring Ilai Hiller: Treating Arianna Delsanto/[name]:[name], [name], [name] Weeks in Treatment: 15 Visit Information History Since Last Visit Added or deleted any medications: No Patient Arrived: [name] Any new allergies or adverse reactions: No Arrival Time: 13:50 Had a fall or experienced change in No Accompanied By: self activities of daily living that may affect Transfer Assistance: None risk of falls: Patient Identification Verified: Yes Signs or symptoms of abuse/neglect since last No Secondary Verification Process Yes visito Completed: Hospitalized since last visit: No Patient Requires Transmission- No Implantable device outside of the clinic excluding No Based Precautions: cellular tissue based products placed in the center Patient Has Alerts: Yes since last visit: Patient Alerts: Right ABI .62 TBI Pain Present Now: No refused Left ABI .60 TBI .14 Electronic Signature(s) Signed: 10/29/2019 4:25:19 PM By: [name] EMT/HBOT Entered By: [name] on 10/29/2019 14:29:59 -------------------------------------------------------------------------------- Encounter Discharge Information Details Patient Name: Date of Service: [name] 10/29/2019 2:00 PM Medical Record Number:[id number] Patient Account Number: [id number] Date of Birth/Sex: Treating RN: May 13, 1941 (79 y.o. F) Primary Care Bridget Jewett: [name] Other Clinician: [name] Referring Dagny Fiorentino: Treating Levar Fayson/[name]:[name], [name], [name] Weeks in Treatment: 15 Encounter Discharge Information Items Discharge  Condition: Stable Ambulatory Status: Walker Discharge Destination: Home Transportation: Private Auto Accompanied By: self Schedule Follow-up Appointment: Yes Clinical Summary of Care: Patient Declined Electronic Signature(s) Signed: 10/29/2019 4:25:19 PM By: [name] EMT/HBOT Entered By: [name] on 10/29/2019 16:24:43 -------------------------------------------------------------------------------- Patient/Caregiver Education Details Patient Name: Date of Service: [name] 2/4/2021andnbsp2:00 PM Medical Record Number:[id number] Patient Account Number: [id number] Date of Birth/Gender: Treating RN: [birth year] (78 y.o. F) Primary Care Physician: [name] Other Clinician: [name] Referring Physician: Treating Physician/[name]:[name], [name], [name] in Treatment: 15 Education Assessment Education Provided To: Patient Education Topics Provided Hyperbaric Oxygenation: Methods: Explain/Verbal Responses: State content correctly Electronic Signature(s) Signed: 10/29/2019 4:25:19 PM By: [name] EMT/HBOT Entered By: [name] on 10/29/2019 16:24:29 -------------------------------------------------------------------------------- Vitals Details Patient Name: Date of Service: [name] 10/29/2019 2:00 PM Medical Record Number:[id number] Patient Account Number: [id number] Date of Birth/Sex: Treating RN: 08/30/1941 (79 y.o. F) Primary Care Evaluna Utke: [name] Other Clinician: [name] Referring Genisis Sonnier: Treating Quetzal Meany/[name]:[name], [name], [name] Weeks in Treatment: 15 Vital Signs Time Taken: 13:55 Temperature (F): 97.6 Height (in): 64 Pulse (bpm): 102 Weight (lbs): 142 Respiratory Rate (breaths/min): 15 Body Mass Index (BMI): 24.4 Blood Pressure (mmHg): 145/46 Capillary Blood Glucose (mg/dl): 136 Reference Range: 80 - 120 mg / dl Electronic Signature(s) Signed: 10/29/2019  4:25:19 PM By: [name] EMT/HBOT Entered By: [name] on 10/29/2019 14:30:18

## 2019-10-30 ENCOUNTER — Encounter (HOSPITAL_BASED_OUTPATIENT_CLINIC_OR_DEPARTMENT_OTHER): Payer: Medicare Other | Admitting: Internal Medicine

## 2019-10-30 ENCOUNTER — Other Ambulatory Visit: Payer: Self-pay

## 2019-10-30 DIAGNOSIS — T8189XA Other complications of procedures, not elsewhere classified, initial encounter: Secondary | ICD-10-CM | POA: Diagnosis not present

## 2019-10-30 DIAGNOSIS — L97518 Non-pressure chronic ulcer of other part of right foot with other specified severity: Secondary | ICD-10-CM | POA: Diagnosis not present

## 2019-10-30 DIAGNOSIS — E11621 Type 2 diabetes mellitus with foot ulcer: Secondary | ICD-10-CM | POA: Diagnosis not present

## 2019-10-30 DIAGNOSIS — L97514 Non-pressure chronic ulcer of other part of right foot with necrosis of bone: Secondary | ICD-10-CM | POA: Diagnosis not present

## 2019-10-30 DIAGNOSIS — E1142 Type 2 diabetes mellitus with diabetic polyneuropathy: Secondary | ICD-10-CM | POA: Diagnosis not present

## 2019-10-30 DIAGNOSIS — L97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 limited to breakdown of skin: Secondary | ICD-10-CM | POA: Diagnosis not present

## 2019-10-30 DIAGNOSIS — E1152 Type 2 diabetes mellitus with diabetic peripheral angiopathy with gangrene: Secondary | ICD-10-CM | POA: Diagnosis not present

## 2019-10-30 DIAGNOSIS — L97528 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ity: Secondary | ICD-10-CM | POA: Diagnosis not present

## 2019-10-30 DIAGNOSIS — L97512 Non-pressure chronic ulcer of other part of right foot with fat layer exposed: Secondary | ICD-10-CM | POA: Diagnosis not present

## 2019-10-30 LAB — GLUCOSE, CAPILLARY
Glucose-Capillary: 147 mg/dL — ABNORMAL HIGH (ref 70–99)
Glucose-Capillary: 153 mg/dL — ABNORMAL HIGH (ref 70–99)

## 2019-10-30 NOTE — Progress Notes (Addendum)
[name], [name] ([id number]) Visit Report for 10/30/2019 HBO Details Patient Name: Date of Service: [name], [name] 10/30/2019 2:00 PM Medical Record Number:[id number] Patient Account Number: [id number] Date of Birth/Sex: Treating RN: 1940-11-08 (79 y.o. F) Primary Care Dale Strausser: [name] Other Clinician: [name] Referring Onika Gudiel: Treating Winter Jocelyn/[name]:[name], [name], [name] Weeks in Treatment: 15 HBO Treatment Course Details Treatment Course Number: 1 Ordering Berlin Mokry: [name] Total Treatments Ordered: 40 HBO Treatment Start Date: 10/12/2019 HBO Indication: Diabetic Ulcer(s) of the Lower Extremity HBO Treatment Details Treatment Number: 15 Patient Type: Outpatient Chamber Type: Monoplace Chamber Serial #: [id number] Treatment Protocol: 2.5 ATA with 90 minutes oxygen, with two 5 minute air breaks Treatment Details Compression Rate Down: 2.0 psi / minute De-Compression Rate Up: 2.0 psi / minute Air breaks and CompressTx Pressure breathing periods DecompressDecompress Begins Reached (leave unused spaces Begins Ends blank) Chamber Pressure (ATA)1 2.5 2.5 2.5 2.5 2.5 --2.5 1 Clock Time (24 hr) 14:19 14:31 15:0115:0615:3615:41--16:11 16:23 Treatment Length: 124 (minutes) Treatment Segments: 4 Vital Signs Capillary Blood Glucose Reference Range: 80 - 120 mg / dl HBO Diabetic Blood Glucose Intervention Range: <131 mg/dl or >249 mg/dl Time Vitals Blood Respiratory Capillary Blood Glucose Pulse Action Type: Pulse: Temperature: Taken: Pressure: Rate: Glucose (mg/dl): Meter #: Oximetry (%) Taken: Pre 14:05 121/44 66 15 98.4 147 Post 16:25 137/63 61 16 98.1 153 Treatment Response Treatment Toleration: Well Treatment Completion Treatment Completed without Adverse Event Status: Additional Procedure Documentation Tissue Sevierity: Necrosis of bone Rontrell Moquin Notes No concerns with treatment given Physician HBO Attestation: I certify that I  supervised this HBO treatment in accordance with Medicare guidelines. A trained Yes emergency response team is readily available per hospital policies and procedures. Continue HBOT as ordered. Yes Electronic Signature(s) Signed: 10/30/2019 5:45:44 PM By: [name] MD Previous Signature: 10/30/2019 4:37:46 PM Version By: [name] EMT/HBOT Entered By: [name] on 10/30/2019 17:24:11 -------------------------------------------------------------------------------- HBO Safety Checklist Details Patient Name: Date of Service: [name] 10/30/2019 2:00 PM Medical Record Number:[id number] Patient Account Number: [id number] Date of Birth/Sex: Treating RN: Mar 04, 1941 (79 y.o. F) Primary Care Dontrell Stuck: [name] Other Clinician: [name] Referring Lakynn Halvorsen: Treating Arlee Santosuosso/[name]:[name], [name], [name] Weeks in Treatment: 15 HBO Safety Checklist Items Safety Checklist Consent Form Signed Patient voided / foley secured and emptied When did you last eato 1200 - rice soup Last dose of injectable or oral agent metformin NA Ostomy pouch emptied and vented if applicable All implantable devices assessed, documented and approved Intravenous access site secured and place PICC line left arm Valuables secured Linens and cotton and cotton/polyester blend (less than 51% polyester) Personal oil-based products / skin lotions / body lotions removed Wigs or hairpieces removed NA Smoking or tobacco materials removed Books / newspapers / magazines / loose paper removed Cologne, aftershave, perfume and deodorant removed Jewelry removed (may wrap wedding band) Make-up removed Hair care products removed NA Battery operated devices (external) removed NA Heating patches and chemical warmers removed NA Titanium eyewear removed Nail polish cured greater than 10 hours NA Casting material cured greater than 10 hours NA Hearing aids removed NA Loose dentures  or partials removed NA Prosthetics have been removed Patient demonstrates correct use of air break device (if applicable) Patient concerns have been addressed Patient grounding bracelet on and cord attached to chamber Specifics for Inpatients (complete in addition to above) Medication sheet sent with patient Intravenous medications needed or due during therapy sent with patient Drainage tubes (e.g. nasogastric tube or chest tube secured and vented) Endotracheal  or Tracheotomy tube secured Cuff deflated of air and inflated with saline Airway suctioned Electronic Signature(s) Signed: 10/30/2019 2:46:42 PM By: [name] EMT/HBOT Entered By: [name] on 10/30/2019 14:46:41

## 2019-10-30 NOTE — Progress Notes (Signed)
[name], [name] ([id number]) Visit Report for 10/30/2019 SuperBill Details Patient Name: Date of Service: [name], [name] 10/30/2019 Medical Record Number:[id number] Patient Account Number: [id number] Date of Birth/Sex: Treating RN: [birth year] (79 y.o. 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, 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 Facility Procedures The patient participates with Medicare or their insurance follows the Medicare Facility Guidelines CPT4 Code Description Modifier Quantity [id number] G0277-(Facility Use Only) HBOT, full body chamber, 76min 4 Physician Procedures CPT4 Code Description Modifier Quantity [id number] 82574 - WC PHYS HYPERBARIC OXYGEN THERAPY 1 ICD-10 Diagnosis Description E11.621 Type 2 diabetes mellitus with foot ulcer Electronic Signature(s) Signed: 10/30/2019 4:37:46 PM By: [name] EMT/HBOT Signed: 10/30/2019 5:45:44 PM By: [name] MD Entered By: [name] on 10/30/2019 16:37:02

## 2019-10-30 NOTE — Progress Notes (Signed)
SHATASIA, CUTSHAW ([id number]) Visit Report for 10/30/2019 Arrival Information Details Patient Name: Date of Service: CLARIECE, ROESLER 10/30/2019 2:00 PM Medical Record Number:[id number] Patient Account Number: [id number] Date of Birth/Sex: Treating RN: July 21, 1941 (79 y.o. F) Primary Care Isiah Scheel: [name] Other Clinician: [name] Referring Enma Maeda: Treating Haeli Gerlich/[name]:[name], [name], [name] Weeks in Treatment: 15 Visit Information History Since Last Visit Added or deleted any medications: No Patient Arrived: [name]: No Arrival Time: 14:00 Had a fall or experienced change in No Accompanied By: self activities of daily living that may affect Transfer Assistance: None risk of falls: Patient Identification Verified: Yes Signs or symptoms of abuse/neglect since last No Secondary Verification Process Yes visito Completed: Hospitalized since last visit: No Patient Requires Transmission- No Implantable device outside of the clinic excluding No Based Precautions: cellular tissue based products placed in the center Patient Has Alerts: Yes since last visit: Patient Alerts: Right ABI .62 TBI Pain Present Now: No refused Left ABI .60 TBI .14 Electronic Signature(s) Signed: 10/30/2019 4:37:46 PM By: [name] EMT/HBOT Entered By: [name] on 10/30/2019 14:44:10 -------------------------------------------------------------------------------- Encounter Discharge Information Details Patient Name: Date of Service: [name] 10/30/2019 2:00 PM Medical Record Number:[id number] Patient Account Number: [id number] Date of Birth/Sex: Treating RN: [birth year] (79 y.o. F) Primary Care Rosena Bartle: [name] Other Clinician: [name] Referring Zenna Traister: Treating Delainey Winstanley/[name]:[name], [name], [name] Weeks in Treatment: 15 Encounter Discharge Information Items Discharge  Condition: Stable Ambulatory Status: Walker Discharge Destination: Home Transportation: Private Auto Accompanied By: self Schedule Follow-up Appointment: Yes Clinical Summary of Care: Patient Declined Electronic Signature(s) Signed: 10/30/2019 4:37:46 PM By: [name] EMT/HBOT Entered By: [name] on 10/30/2019 16:37:27 -------------------------------------------------------------------------------- Patient/Caregiver Education Details Patient Name: Date of Service: [name] 2/5/2021andnbsp2:00 PM Medical Record Number:[id number] Patient Account Number: [id number] Date of Birth/Gender: Treating RN: 1941-01-05 (78 y.o. F) Primary Care Physician: [name] Other Clinician: [name] Referring Physician: Treating Physician/[name]:[name], [name], [name] in Treatment: 15 Education Assessment Education Provided To: Patient Education Topics Provided Hyperbaric Oxygenation: Methods: Explain/Verbal Responses: State content correctly Electronic Signature(s) Signed: 10/30/2019 4:37:46 PM By: [name] EMT/HBOT Entered By: [name] on 10/30/2019 16:37:15 -------------------------------------------------------------------------------- Vitals Details Patient Name: Date of Service: [name] 10/30/2019 2:00 PM Medical Record Number:[id number] Patient Account Number: [id number] Date of Birth/Sex: Treating RN: 27-May-1941 (79 y.o. F) Primary Care Lajeana Strough: [name] Other Clinician: [name] Referring Skylan Lara: Treating Rosemarie Galvis/[name]:[name], [name], [name] Weeks in Treatment: 15 Vital Signs Time Taken: 14:05 Temperature (F): 98.4 Height (in): 64 Pulse (bpm): 66 Weight (lbs): 142 Respiratory Rate (breaths/min): 15 Body Mass Index (BMI): 24.4 Blood Pressure (mmHg): 121/44 Capillary Blood Glucose (mg/dl): 147 Reference Range: 80 - 120 mg / dl Electronic Signature(s) Signed: 10/30/2019  4:37:46 PM By: [name] EMT/HBOT Entered By: [name] on 10/30/2019 14:45:23

## 2019-11-01 ENCOUNTER — Other Ambulatory Visit
Admission: RE | Admit: 2019-11-01 | Discharge: 2019-11-01 | Disposition: A | Discharge status: Home / Self Care | Payer: Medicare Other | Source: Skilled Nursing Facility | Attending: Podiatry | Admitting: Podiatry

## 2019-11-01 DIAGNOSIS — I10 Essential (primary) hypertension: Secondary | ICD-10-CM | POA: Diagnosis not present

## 2019-11-01 DIAGNOSIS — E1152 Type 2 diabetes mellitus with diabetic peripheral angiopathy with gangrene: Secondary | ICD-10-CM | POA: Diagnosis not present

## 2019-11-01 DIAGNOSIS — Z4781 Encounter for orthopedic aftercare following surgical amputation: Secondary | ICD-10-CM | POA: Diagnosis not present

## 2019-11-01 DIAGNOSIS — E11621 Type 2 diabetes mellitus with foot ulcer: Secondary | ICD-10-CM | POA: Diagnosis not present

## 2019-11-01 DIAGNOSIS — L97528 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ity: Secondary | ICD-10-CM | POA: Diagnosis not present

## 2019-11-01 DIAGNOSIS — D649 Anemia, unspecified: Secondary | ICD-10-CM | POA: Diagnosis not present

## 2019-11-01 DIAGNOSIS — Z792 Long term (current) use of antibiotics: Secondary | ICD-10-CM | POA: Diagnosis not present

## 2019-11-01 DIAGNOSIS — M1990 Unspecified osteoarthritis, unspecified site: Secondary | ICD-10-CM | POA: Diagnosis not present

## 2019-11-01 LAB — BASIC METABOLIC PANEL
Anion gap: 12 (ref 5–15)
BUN: 28 mg/dL — ABNORMAL HIGH (ref 8–23)
CO2: 27 mmol/L (ref 22–32)
Calcium: 9.9 mg/dL (ref 8.9–10.3)
Chloride: 101 mmol/L (ref 98–111)
Creatinine, Ser: 0.72 mg/dL (ref 0.44–1.00)
GFR calc Af Amer: >60 mL/min (ref >60)
GFR calc non Af Amer: >60 mL/min (ref >60)
Glucose, Bld: 102 mg/dL — ABNORMAL HIGH (ref 70–99)
Potassium: 3.9 mmol/L (ref 3.5–5.1)
Sodium: 140 mmol/L (ref 135–145)

## 2019-11-01 LAB — SEDIMENTATION RATE: Sed Rate: 39 mm/hr — ABNORMAL HIGH (ref 0–30)

## 2019-11-01 LAB — CBC WITH DIFFERENTIAL/PLATELET
Abs Immature Granulocytes: 0.04 10*3/uL (ref 0.00–0.07)
Basophils Absolute: 0.1 10*3/uL (ref 0.0–0.1)
Basophils Relative: 1 %
Eosinophils Absolute: 1 10*3/uL — ABNORMAL HIGH (ref 0.0–0.5)
Eosinophils Relative: 12 %
HCT: 33.9 % — ABNORMAL LOW (ref 36.0–46.0)
Hemoglobin: 10.9 g/dL — ABNORMAL LOW (ref 12.0–15.0)
Immature Granulocytes: 0 %
Lymphocytes Relative: 25 %
Lymphs Abs: 2.2 10*3/uL (ref 0.7–4.0)
MCH: 27.7 pg (ref 26.0–34.0)
MCHC: 32.2 g/dL (ref 30.0–36.0)
MCV: 86.3 fL (ref 80.0–100.0)
Monocytes Absolute: 0.9 10*3/uL (ref 0.1–1.0)
Monocytes Relative: 11 %
Neutro Abs: 4.6 10*3/uL (ref 1.7–7.7)
Neutrophils Relative %: 51 %
Platelets: 365 10*3/uL (ref 150–400)
RBC: 3.93 MIL/uL (ref 3.87–5.11)
RDW: 14 % (ref 11.5–15.5)
WBC: 8.9 10*3/uL (ref 4.0–10.5)
nRBC: 0 % (ref 0.0–0.2)

## 2019-11-02 ENCOUNTER — Other Ambulatory Visit: Payer: Self-pay

## 2019-11-02 ENCOUNTER — Encounter (HOSPITAL_BASED_OUTPATIENT_CLINIC_OR_DEPARTMENT_OTHER): Payer: Medicare Other | Admitting: Internal Medicine

## 2019-11-02 DIAGNOSIS — E11621 Type 2 diabetes mellitus with foot ulcer: Secondary | ICD-10-CM | POA: Diagnosis not present

## 2019-11-02 DIAGNOSIS — E1152 Type 2 diabetes mellitus with diabetic peripheral angiopathy with gangrene: Secondary | ICD-10-CM | POA: Diagnosis not present

## 2019-11-02 DIAGNOSIS — E1142 Type 2 diabetes mellitus with diabetic polyneuropathy: Secondary | ICD-10-CM | POA: Diagnosis not present

## 2019-11-02 DIAGNOSIS — L97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 limited to breakdown of skin: Secondary | ICD-10-CM | POA: Diagnosis not present

## 2019-11-02 DIAGNOSIS — L97514 Non-pressure chronic ulcer of other part of right foot with necrosis of bone: Secondary | ICD-10-CM | POA: Diagnosis not present

## 2019-11-02 DIAGNOSIS — L97528 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ity: Secondary | ICD-10-CM | POA: Diagnosis not present

## 2019-11-02 DIAGNOSIS — L97518 Non-pressure chronic ulcer of other part of right foot with other specified severity: Secondary | ICD-10-CM | POA: Diagnosis not present

## 2019-11-02 LAB — C-REACTIVE PROTEIN: CRP: 3.4 mg/dL — ABNORMAL HIGH (ref <1.0)

## 2019-11-02 LAB — GLUCOSE, CAPILLARY
Glucose-Capillary: 115 mg/dL — ABNORMAL HIGH (ref 70–99)
Glucose-Capillary: 127 mg/dL — ABNORMAL HIGH (ref 70–99)

## 2019-11-02 NOTE — Progress Notes (Addendum)
[name] ([id number]) Visit Report for 11/02/2019 HBO Details Patient Name: Date of Service: [name], [name] 11/02/2019 2:00 PM Medical Record Number:[id number] Patient Account Number: [id number] Date of Birth/Sex: Treating RN: 1940/12/02 (79 y.o. F) Primary Care Dalaney Needle: [name] Other Clinician: [name] Referring Lowanda Cashaw: Treating Brittay Mogle/[name]:[name], [name], [name] Weeks in Treatment: 16 HBO Treatment Course Details Treatment Course Number: 1 Ordering Sayler Mickiewicz: [name] Total Treatments Ordered: 40 HBO Treatment Start Date: 10/12/2019 HBO Indication: Diabetic Ulcer(s) of the Lower Extremity HBO Treatment Details Treatment Number: 16 Patient Type: Outpatient Chamber Type: Monoplace Chamber Serial #: [id number] Treatment Protocol: 2.5 ATA with 90 minutes oxygen, with two 5 minute air breaks Treatment Details Compression Rate Down: 2.0 psi / minute De-Compression Rate Up: 2.0 psi / minute Air breaks and CompressTx Pressure breathing periods DecompressDecompress Begins Reached (leave unused spaces Begins Ends blank) Chamber Pressure (ATA)1 2.5 2.5 2.5 2.5 2.5 --2.5 1 Clock Time (24 hr) 14:15 14:27 67:1245:8099:8338:25--05:39 16:19 Treatment Length: 124 (minutes) Treatment Segments: 4 Vital Signs Capillary Blood Glucose Reference Range: 80 - 120 mg / dl HBO Diabetic Blood Glucose Intervention Range: <131 mg/dl or >249 mg/dl Time Vitals Blood Respiratory Capillary Blood Glucose Pulse Action Type: Pulse: Temperature: Taken: Pressure: Rate: Glucose (mg/dl): Meter #: Oximetry (%) Taken: Pre 14:05 138/56 77 14 97.5 125 Post 16:21 160/55 72 14 98.3 127 Treatment Response Treatment Toleration: Well Treatment Completion Treatment Completed without Adverse Event Status: Additional Procedure Documentation Tissue Sevierity: Necrosis of bone Brennen Gardiner Notes No concerns with treatment given Physician HBO Attestation: I certify that I  supervised this HBO treatment in accordance with Medicare guidelines. A trained Yes emergency response team is readily available per hospital policies and procedures. Continue HBOT as ordered. Yes Electronic Signature(s) Signed: 11/02/2019 6:01:37 PM By: [name] MD Previous Signature: 11/02/2019 4:47:04 PM Version By: [name] EMT/HBOT Entered By: [name] on 11/02/2019 18:00:22 -------------------------------------------------------------------------------- HBO Safety Checklist Details Patient Name: Date of Service: [name] 11/02/2019 2:00 PM Medical Record Number:[id number] Patient Account Number: [id number] Date of Birth/Sex: Treating RN: 19-Oct-1940 (79 y.o. F) Primary Care Leahanna Buser: [name] Other Clinician: [name] Referring Chikita Dogan: Treating Xitlali Kastens/[name]:[name], [name], [name] Weeks in Treatment: 16 HBO Safety Checklist Items Safety Checklist Consent Form Signed Patient voided / foley secured and emptied When did you last eato 1200 - turkey/rice Last dose of injectable or oral agent metformin NA Ostomy pouch emptied and vented if applicable All implantable devices assessed, documented and approved Intravenous access site secured and place PICC line left arm Valuables secured Linens and cotton and cotton/polyester blend (less than 51% polyester) Personal oil-based products / skin lotions / body lotions removed Wigs or hairpieces removed NA Smoking or tobacco materials removed Books / newspapers / magazines / loose paper removed Cologne, aftershave, perfume and deodorant removed Jewelry removed (may wrap wedding band) Make-up removed Hair care products removed NA Battery operated devices (external) removed NA Heating patches and chemical warmers removed NA Titanium eyewear removed Nail polish cured greater than 10 hours NA Casting material cured greater than 10 hours NA Hearing aids removed NA Loose dentures  or partials removed NA Prosthetics have been removed Patient demonstrates correct use of air break device (if applicable) Patient concerns have been addressed Patient grounding bracelet on and cord attached to chamber Specifics for Inpatients (complete in addition to above) Medication sheet sent with patient Intravenous medications needed or due during therapy sent with patient Drainage tubes (e.g. nasogastric tube or chest tube secured and vented) Endotracheal or  Tracheotomy tube secured Cuff deflated of air and inflated with saline Airway suctioned Electronic Signature(s) Signed: 11/02/2019 2:22:03 PM By: [name] EMT/HBOT Entered By: [name] on 11/02/2019 14:22:03

## 2019-11-02 NOTE — Progress Notes (Signed)
[name], [name] ([id number]) Visit Report for 11/02/2019 SuperBill Details Patient Name: Date of Service: [name], [name] 11/02/2019 Medical Record Number:[id number] Patient Account Number: [id number] Date of Birth/Sex: Treating RN: 1941/02/15 (80 y.o. 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 other specified severity L97.528 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ity E11.42 Type 2 diabetes mellitus with diabetic polyneuropathy 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, 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 Facility Procedures The patient participates with Medicare or their insurance follows the Medicare Facility Guidelines CPT4 Code Description Modifier Quantity [id number] G0277-(Facility Use Only) HBOT, full body chamber, 44min 4 Physician Procedures CPT4 Code Description Modifier Quantity [id number] 66063 - WC PHYS HYPERBARIC OXYGEN THERAPY 1 ICD-10 Diagnosis Description E11.621 Type 2 diabetes mellitus with foot ulcer Electronic Signature(s) Signed: 11/02/2019 4:47:04 PM By: [name] EMT/HBOT Signed: 11/02/2019 6:01:37 PM By: [name] MD Entered By: [name] on 11/02/2019 16:46:00

## 2019-11-02 NOTE — Progress Notes (Signed)
[name], [name] ([id number]) Visit Report for 11/02/2019 Arrival Information Details Patient Name: Date of Service: [name], [name] 11/02/2019 2:00 PM Medical Record Number:[id number] Patient Account Number: [id number] Date of Birth/Sex: Treating RN: Nov 11, 1940 (79 y.o. F) Primary Care [name]: [name] Other Clinician: [name] Referring Jozee Hammer: Treating Runell Kovich/[name]:[name], [name], [name] Weeks in Treatment: 16 Visit Information History Since Last Visit Added or deleted any medications: No Patient Arrived: Walker Any new allergies or adverse reactions: No Arrival Time: 14:00 Had a fall or experienced change in No Accompanied By: self activities of daily living that may affect Transfer Assistance: None risk of falls: Patient Identification Verified: Yes Signs or symptoms of abuse/neglect since last No Secondary Verification Process Yes visito Completed: Hospitalized since last visit: No Patient Requires Transmission- No Implantable device outside of the clinic excluding No Based Precautions: cellular tissue based products placed in the center Patient Has Alerts: Yes since last visit: Patient Alerts: Right ABI .62 TBI Pain Present Now: No refused Left ABI .60 TBI .14 Electronic Signature(s) Signed: 11/02/2019 4:47:04 PM By: [name] EMT/HBOT Entered By: [name] on 11/02/2019 14:19:36 -------------------------------------------------------------------------------- Encounter Discharge Information Details Patient Name: Date of Service: [name] 11/02/2019 2:00 PM Medical Record Number:[id number] Patient Account Number: [id number] Date of Birth/Sex: Treating RN: April 07, 1941 (79 y.o. F) Primary Care [name]: [name] Other Clinician: [name] Referring [name]: Treating Kamillah Didonato/[name]:[name], [name], [name] Weeks in Treatment: 50 Encounter Discharge Information Items Discharge  Condition: Stable Ambulatory Status: Walker Discharge Destination: Home Transportation: Private Auto Accompanied By: self Schedule Follow-up Appointment: Yes Clinical Summary of Care: Patient Declined Electronic Signature(s) Signed: 11/02/2019 4:47:04 PM By: [name] EMT/HBOT Entered By: [name] on 11/02/2019 16:46:28 -------------------------------------------------------------------------------- Patient/Caregiver Education Details Patient Name: Date of Service: [name] 2/8/2021andnbsp2:00 PM Medical Record Number:[id number] Patient Account Number: [id number] Date of Birth/Gender: Treating RN: 02/11/1941 (78 y.o. F) Primary Care Physician: [name] Other Clinician: [name] Referring Physician: Treating Physician/[name]:[name], [name], [name] in Treatment: 49 Education Assessment Education Provided To: Patient Education Topics Provided Hyperbaric Oxygenation: Methods: Explain/Verbal Responses: State content correctly Electronic Signature(s) Signed: 11/02/2019 4:47:04 PM By: [name] EMT/HBOT Entered By: [name] on 11/02/2019 16:46:13 -------------------------------------------------------------------------------- Vitals Details Patient Name: Date of Service: [name] 11/02/2019 2:00 PM Medical Record Number:[id number] Patient Account Number: [id number] Date of Birth/Sex: Treating RN: 18-Feb-1941 (79 y.o. F) Primary Care [name]: [name] Other Clinician: [name] Referring [name]: Treating [name]:[name], [name], [name] Weeks in Treatment: 16 Vital Signs Time Taken: 14:05 Temperature (F): 97.5 Height (in): 64 Pulse (bpm): 77 Weight (lbs): 142 Respiratory Rate (breaths/min): 14 Body Mass Index (BMI): 24.4 Blood Pressure (mmHg): 138/56 Capillary Blood Glucose (mg/dl): 125 Reference Range: 80 - 120 mg / dl Electronic Signature(s) Signed: 11/02/2019  4:47:04 PM By: [name] EMT/HBOT Entered By: [name] on 11/02/2019 14:21:09

## 2019-11-03 ENCOUNTER — Encounter (HOSPITAL_BASED_OUTPATIENT_CLINIC_OR_DEPARTMENT_OTHER): Payer: Medicare Other | Admitting: Internal Medicine

## 2019-11-03 DIAGNOSIS — L97514 Non-pressure chronic ulcer of other part of right foot with necrosis of bone: Secondary | ICD-10-CM | POA: Diagnosis not present

## 2019-11-03 DIAGNOSIS — L97528 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ity: Secondary | ICD-10-CM | POA: Diagnosis not present

## 2019-11-03 DIAGNOSIS — E1152 Type 2 diabetes mellitus with diabetic peripheral angiopathy with gangrene: Secondary | ICD-10-CM | POA: Diagnosis not present

## 2019-11-03 DIAGNOSIS — E1142 Type 2 diabetes mellitus with diabetic polyneuropathy: Secondary | ICD-10-CM | POA: Diagnosis not present

## 2019-11-03 DIAGNOSIS — E11621 Type 2 diabetes mellitus with foot ulcer: Secondary | ICD-10-CM | POA: Diagnosis not present

## 2019-11-03 DIAGNOSIS — L97518 Non-pressure chronic ulcer of other part of right foot with other specified severity: Secondary | ICD-10-CM | POA: Diagnosis not present

## 2019-11-03 DIAGNOSIS — L97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 limited to breakdown of skin: Secondary | ICD-10-CM | POA: Diagnosis not present

## 2019-11-03 LAB — GLUCOSE, CAPILLARY
Glucose-Capillary: 113 mg/dL — ABNORMAL HIGH (ref 70–99)
Glucose-Capillary: 107 mg/dL — ABNORMAL HIGH (ref 70–99)

## 2019-11-03 NOTE — Progress Notes (Signed)
ZIARE, ORRICK ([id number]) Visit Report for 10/30/2019 Arrival Information Details Patient Name: Date of Service: LUTRICIA, WIDJAJA 10/30/2019 4:00 PM Medical Record Number:[id number] Patient Account Number: [id number] Date of Birth/Sex: Treating RN: 1941-01-30 (79 y.o. [name] Primary Care Cleo Santucci: [name] Other Clinician: Referring Kenadee Gates: Treating Fatim Vanderschaaf/[name]:[name], [name], [name] Weeks in Treatment: 15 Visit Information History Since Last Visit Added or deleted any medications: No Patient Arrived: Walker Any new allergies or adverse reactions: No Arrival Time: 16:43 Had a fall or experienced change in No Accompanied By: alone activities of daily living that may affect Transfer Assistance: None risk of falls: Patient Identification Verified: Yes Signs or symptoms of abuse/neglect since last No Secondary Verification Process Yes visito Completed: Hospitalized since last visit: No Patient Requires Transmission- No Implantable device outside of the clinic excluding No Based Precautions: cellular tissue based products placed in the center Patient Has Alerts: Yes since last visit: Patient Alerts: Right ABI .41 TBI Has Dressing in Place as Prescribed: Yes refused Pain Present Now: No Left ABI .60 TBI .14 Electronic Signature(s) Signed: 11/03/2019 5:30:42 PM By: [name] RN, BSN Entered By: [name] on 10/30/2019 16:54:44 -------------------------------------------------------------------------------- Encounter Discharge Information Details Patient Name: Date of Service: [name] 10/30/2019 4:00 PM Medical Record Number:[id number] Patient Account Number: [id number] Date of Birth/Sex: Treating RN: [birth year] (79 y.o. [name] Primary Care Hanae Waiters: [name] Other Clinician: Referring Aleksei Goodlin: Treating Zaia Carre/[name]:[name], [name], [name] Weeks in Treatment: 15 Encounter  Discharge Information Items Post Procedure Vitals Discharge Condition: Stable Temperature (F): 98.4 Ambulatory Status: Walker Pulse (bpm): 66 Discharge Destination: Home Respiratory Rate (breaths/min): 16 Transportation: Private Auto Blood Pressure (mmHg): 121/44 Accompanied By: self Schedule Follow-up Appointment: Yes Clinical Summary of Care: Electronic Signature(s) Signed: 10/30/2019 5:54:34 PM By: Deon Pilling Entered By: Deon Pilling on 10/30/2019 17:54:14 -------------------------------------------------------------------------------- Lower Extremity Assessment Details Patient Name: Date of Service: RHYLEIGH, GRASSEL 10/30/2019 4:00 PM Medical Record Number:[id number] Patient Account Number: [id number] Date of Birth/Sex: Treating RN: [birth year] (79 y.o. [name] Primary Care Jonty Morrical: [name] Other Clinician: Referring Gleb Mcguire: Treating Damira Kem/[name]:[name], [name], [name] Weeks in Treatment: 15 Edema Assessment Assessed: [Left: No] [Right: No] Edema: [Left: No] [Right: No] Calf Left: Right: Point of Measurement: 39 cm From Medial Instep 30 cm 29 cm Ankle Left: Right: Point of Measurement: 10 cm From Medial Instep 19 cm 19 cm Vascular Assessment Pulses: Dorsalis Pedis Palpable: [Left:Yes] [Right:Yes] Electronic Signature(s) Signed: 11/03/2019 5:30:42 PM By: [name] RN, BSN Entered By: [name] on 10/30/2019 16:55:13 -------------------------------------------------------------------------------- Multi Wound Chart Details Patient Name: Date of Service: [name] 10/30/2019 4:00 PM Medical Record Number:[id number] Patient Account Number: [id number] Date of Birth/Sex: Treating RN: Sep 15, 1941 (79 y.o. F) Primary Care Damaso Laday: Other Clinician: [name] Referring Felica Chargois: Treating Athan Casalino/[name]:[name], [name], [name] Weeks in Treatment: 15 Vital Signs Height(in): 68 Capillary  Blood 147 Glucose(mg/dl): Weight(lbs): 142 Pulse(bpm): 65 Body Mass Index(BMI): 24 Blood Pressure(mmHg): 121/44 Temperature(F): 98.4 Respiratory 16 Rate(breaths/min): Photos: [1:No Photos] [10:No Photos] [7:No Photos] Wound Location: [1:Left Toe Fourth - DorsalRight Amputation Site -] [10:Transmetatarsal - Lateral Transmetatarsal] [7:Right Amputation Site -] Wounding Event: [1:Blister] [10:Surgical Injury] [7:Surgical Injury] 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] [10:Lower Extremity] [7:Lower Extremity] Secondary Etiology: [1:Arterial Insufficiency Ulcer N/A] [7:N/A] Comorbid History: [1:Deep Vein Thrombosis, Hypertension, Peripheral Hypertension, Peripheral Hypertension, Peripheral Arterial Disease, Type II Diabetes] [10:Deep Vein Thrombosis, Arterial Disease, Type II Arterial Disease, Type II Diabetes] [7:Deep Vein  Thrombosis, Diabetes]  Date Acquired: [1:09/16/2018] [10:10/16/2019] [7:08/07/2019] Weeks of Treatment: [1:15] [10:2] [7:9] Wound Status: [1:Open] [10:Open] [7:Open] Measurements L x W x D 0.3x0.3x0.3 [10:0.2x0.6x0.2] [7:0.8x2.4x0.2] (cm) Area (cm) : [1:0.071] [10:0.094] [7:1.508] Volume (cm) : [1:0.021] [10:0.019] [7:0.302] % Reduction in Area: [1:90.00%] [10:43.00%] [7:85.50%] % Reduction in Volume: 70.40% [10:42.40%] [7:70.90%] Classification: [1:Grade 2] [10:Grade 3] [7:Grade 4] Exudate Amount: [1:Small] [10:Small] [7:Medium] Exudate Type: [1:Serosanguineous] [10:Serosanguineous] [7:Serosanguineous] Exudate Color: [1:red, brown] [10:red, brown] [7:red, brown] Wound Margin: [1:Distinct, outline attached Distinct, outline attached Well defined, not attached] Granulation Amount: [1:Large (67-100%)] [10:Large (67-100%)] [7:Medium (34-66%)] Granulation Quality: [1:Red] [10:Pink] [7:Pink] Necrotic Amount: [1:None Present (0%)] [10:Small (1-33%)] [7:Medium (34-66%)] Exposed  Structures: [1:Fat Layer (Subcutaneous Fat Layer (Subcutaneous Fat Layer (Subcutaneous Tissue) Exposed: Yes Bone: Yes Fascia: No Tendon: No Muscle: No Joint: No] [10:Tissue) Exposed: Yes Fascia: No Tendon: No Muscle: No Joint: No Bone: No] [7:Tissue) Exposed: Yes  Fascia: No Tendon: No Muscle: No Joint: No Bone: No] Epithelialization: [1:Small (1-33%)] [10:Small (1-33%)] [7:Small (1-33%)] Debridement: [1:N/A] [10:N/A] [7:Debridement - Excisional] Pre-procedure [1:N/A] [10:N/A] [7:17:10] Verification/Time Out Taken: Pain Control: [1:N/A] [10:N/A] [7:Other] Tissue Debrided: [1:N/A] [10:N/A] [7:Subcutaneous] Level: [1:N/A] [10:N/A] [7:Skin/Subcutaneous Tissue] Debridement Area (sq cm):N/A [10:N/A] [7:1.92] Instrument: [1:N/A] [10:N/A] [7:Curette] Bleeding: [1:N/A] [10:N/A] [7:Minimum] Hemostasis Achieved: [1:N/A] [10:N/A] [7:Pressure] Procedural Pain: [1:N/A] [10:N/A] [7:1] Post Procedural Pain: [1:N/A] [10:N/A] [7:0] Debridement Treatment [1:N/A] [10:N/A] [7:Procedure was tolerated] Response: [7:well] Post Debridement [1:N/A] [10:N/A] [7:0.8x2.4x0.2] Measurements L x W x D (cm) Post Debridement [1:N/A] [10:N/A] [7:0.302] Volume: (cm) Procedures Performed: [1:N/A 8] [10:N/A 9] [7:Debridement N/A] Photos: [1:No Photos] [10:No Photos] [7:N/A] Wound Location: [1:Left Toe Third] [10:Left Achilles] [7:N/A] Wounding Event: [1:Gradually Appeared] [10:Trauma] [7:N/A] Primary Etiology: [1:Diabetic Wound/Ulcer of the Diabetic Wound/Ulcer of the N/A Lower Extremity] [10:Lower Extremity] Secondary Etiology: [1:N/A] [10:N/A] [7:N/A] Comorbid History: [1:Deep Vein Thrombosis, Hypertension, Peripheral Hypertension, Peripheral Arterial Disease, Type II Diabetes] [10:Deep Vein Thrombosis, Arterial Disease, Type II Diabetes] [7:N/A] Date Acquired: [1:09/10/2019] [10:10/15/2019] [7:N/A] Weeks of Treatment: [1:7] [10:2] [7:N/A] Wound Status: [1:Open] [10:Healed - Epithelialized] [7:N/A] Measurements L x W  x D 0.4x0.5x0.2 [10:0x0x0] [7:N/A] (cm) Area (cm) : [1:0.157] [10:0] [7:N/A] Volume (cm) : [1:0.031] [10:0] [7:N/A] % Reduction in Area: [1:64.30%] [10:100.00%] [7:N/A] % Reduction in Volume: 29.50% [10:100.00%] [7:N/A] Classification: [1:Grade 2] [10:Grade 1] [7:N/A] Exudate Amount: [1:Small] [10:None Present] [7:N/A] Exudate Type: [1:Serosanguineous] [10:N/A] [7:N/A] Exudate Color: [1:red, brown] [10:N/A] [7:N/A] Wound Margin: [1:Distinct, outline attached Flat and Intact] [7:N/A] Granulation Amount: [1:Small (1-33%)] [10:None Present (0%)] [7:N/A] Granulation Quality: [1:Pink, Pale] [10:N/A] [7:N/A] Necrotic Amount: [1:Large (67-100%)] [10:None Present (0%)] [7:N/A] Exposed Structures: [1:Fat Layer (Subcutaneous Fascia: No Tissue) Exposed: Yes Fascia: No Tendon: No Muscle: No Joint: No Bone: No] [10:Fat Layer (Subcutaneous Tissue) Exposed: No Tendon: No Muscle: No Joint: No Bone: No Limited to Skin Breakdown] [7:N/A] Epithelialization: [1:Small (1-33%)] [10:Large (67-100%)] [7:N/A] Debridement: [1:N/A] [10:N/A] [7:N/A] Pain Control: [1:N/A] [10:N/A] [7:N/A] Tissue Debrided: [1:N/A] [10:N/A] [7:N/A] Level: [1:N/A] [10:N/A] [7:N/A] Debridement Area (sq cm):N/A [10:N/A] [7:N/A] Instrument: [1:N/A] [10:N/A] [7:N/A] Bleeding: [1:N/A] [10:N/A] [7:N/A] Hemostasis Achieved: [1:N/A] [10:N/A] [7:N/A] Procedural Pain: [1:N/A] [10:N/A] [7:N/A] Post Procedural Pain: [1:N/A] [10:N/A] [7:N/A] Debridement Treatment N/A [10:N/A] [7:N/A] Response: Post Debridement [1:N/A] [10:N/A] [7:N/A] Measurements L x W x D (cm) Post Debridement [1:N/A] [10:N/A] [7:N/A] Volume: (cm) Procedures Performed: N/A [10:N/A] [7:N/A] Treatment Notes Electronic Signature(s) Signed: 10/30/2019 5:45:44 PM By: [name] MD Entered By: [name] on 10/30/2019 17:37:40 -------------------------------------------------------------------------------- Multi-Disciplinary Care Plan Details Patient Name: Date  of Service: [name] 10/30/2019 4:00 PM Medical Record Number:[id number] Patient Account  Number: [id number] Date of Birth/Sex: Treating RN: 10-16-1940 (79 y.o. 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 Potential for barotraumas to ears, sinuses, teeth, and lungs or cerebral gas embolism related to changes in atmospheric pressure inside hyperbaric oxygen chamber Goals: Barotrauma will be prevented during HBO2 Date Initiated: 10/01/2019 Target Resolution Date: 12/04/2019 Goal Status: Active Patient and/or family will be able to state/discuss factors appropriate to the management of their disease process during treatment Date Initiated: 10/01/2019 Target Resolution Date: 12/04/2019 Goal Status: Active Patient/caregiver will verbalize understanding of HBO goals, rationale, procedures and potential hazards Date Initiated: 10/01/2019 Date Inactivated: 10/22/2019 Target Resolution Date: 10/30/2019 Goal Status: Met Interventions: Assess and provide for patients comfort related to the hyperbaric environment and equalization of middle ear Assess patient for any history of confinement anxiety Notes: Abuse / Safety / Falls / Self Care Management Nursing Diagnoses: Potential for falls Goals: Patient will remain injury free related to falls Date Initiated: 08/27/2019 Target Resolution Date: 12/04/2019 Goal Status: Active Interventions: Provide education on fall prevention Notes: Pain, Acute or Chronic Nursing Diagnoses: Pain, acute or chronic: actual or potential Potential alteration in comfort, pain Goals: Patient will verbalize adequate pain control and receive pain control interventions during procedures as  needed Date Initiated: 08/27/2019 Target Resolution Date: 12/04/2019 Goal Status: Active Interventions: Encourage patient to take pain medications as prescribed Provide education on pain management Reposition patient for comfort Notes: Electronic Signature(s) Signed: 10/30/2019 5:49:59 PM By: Kela Millin Entered By: Kela Millin on 10/30/2019 17:10:53 -------------------------------------------------------------------------------- Pain Assessment Details Patient Name: Date of Service: [name] 10/30/2019 4:00 PM Medical Record Number:[id number] Patient Account Number: [id number] Date of Birth/Sex: Treating RN: 28-Mar-1941 (79 y.o. [name] Primary Care Dionta Larke: [name] Other Clinician: Referring Future Yeldell: Treating Carlye Panameno/[name]:[name], [name], [name] Weeks in Treatment: 15 Active Problems Location of Pain Severity and Description of Pain Patient Has Paino No Site Locations Pain Management and Medication Current Pain Management: Electronic Signature(s) Signed: 11/03/2019 5:30:42 PM By: [name] RN, BSN Entered By: [name] on 10/30/2019 16:55:05 -------------------------------------------------------------------------------- Patient/Caregiver Education Details Patient Name: Date of Service: [name] 2/5/2021andnbsp4:00 PM Medical Record Number:[id number] Patient Account Number: [id number] Date of Birth/Gender: Treating RN: 10-19-1940 (78 y.o. [name] Primary Care Physician: [name] Other Clinician: Referring Physician: Treating Physician/[name]:[name], [name], [name] in Treatment: 15 Education Assessment Education Provided To: Patient Education Topics Provided Pain: Methods: Explain/Verbal Responses: State content correctly Safety: Methods: Explain/Verbal Responses: State content correctly Electronic Signature(s) Signed: 10/30/2019 5:49:59 PM By:  Kela Millin Entered By: Kela Millin on 10/30/2019 17:17:13 -------------------------------------------------------------------------------- Wound Assessment Details Patient Name: Date of Service: [name] 10/30/2019 4:00 PM Medical Record Number:[id number] Patient Account Number: [id number] Date of Birth/Sex: Treating RN: [birth year] (79 y.o. F) Primary Care Chanel Mcadams: [name] Other Clinician: Referring Rayna Brenner: Treating Curley Hogen/[name]:[name], [name], [name] Weeks in Treatment: 15 Wound Status Wound Number: 1 Primary Diabetic Wound/Ulcer of the Lower Extremity Etiology: Wound Location: Left Toe Fourth - Dorsal Secondary Arterial Insufficiency Ulcer Wounding Event: Blister Etiology: Date Acquired: 09/16/2018 Wound Open Weeks Of Treatment: 15 Status: Clustered Wound: No Comorbid Deep Vein Thrombosis, Hypertension, History: Peripheral Arterial Disease, Type II Diabetes Photos Wound Measurements Length: (cm) 0.3 % Reduction i Width: (cm) 0.3 % Reduction i Depth: (cm) 0.3 Epithelializa Area: (cm) 0.071 Tunneling: Volume: (cm) 0.021 Undermining: Wound  Description Classification: Grade 2 Foul Odor Aft Wound Margin: Distinct, outline attached Slough/Fibrin Exudate Amount: Small Exudate Type: Serosanguineous Exudate Color: red, brown Wound Bed Granulation Amount: Large (67-100%) Granulation Quality: Red Fascia Expose Necrotic Amount: None Present (0%) Fat Layer (Su Tendon Expose Muscle Expose Joint Exposed Bone Exposed: er Cleansing: No o No Exposed Structure d: No bcutaneous Tissue) Exposed: Yes d: No d: No : No Yes n Area: 90% n Volume: 70.4% tion: Small (1-33%) No No Electronic Signature(s) Signed: 11/03/2019 4:28:10 PM By: [name] EMT/HBOT Entered By: [name] on 11/03/2019 13:13:54 -------------------------------------------------------------------------------- Wound Assessment Details Patient  Name: Date of Service: [name] 10/30/2019 4:00 PM Medical Record Number:[id number] Patient Account Number: [id number] Date of Birth/Sex: Treating RN: [birth year] (79 y.o. F) Primary Care Raphael Fitzpatrick: [name] Other Clinician: Referring Catarina Huntley: Treating Jazmin Vensel/[name]:[name], [name], [name] Weeks in Treatment: 15 Wound Status Wound Number: 10 Primary Diabetic Wound/Ulcer of the Lower Extremity Etiology: Wound Location: Right Amputation Site - Transmetatarsal - Lateral Wound Open Status: Wounding Event: Surgical Injury Comorbid Deep Vein Thrombosis, Hypertension, Date Acquired: 10/16/2019 History: Peripheral Arterial Disease, Type II Diabetes Weeks Of Treatment: 2 Clustered Wound: No Photos Wound Measurements Length: (cm) 0.2 Width: (cm) 0.6 Depth: (cm) 0.2 Area: (cm) 0.094 Volume: (cm) 0.019 Wound Description Classification: Grade 3 Wound Margin: Distinct, outline attached Exudate Amount: Small Exudate Type: Serosanguineous Exudate Color: red, brown Wound Bed Granulation Amount: Large (67-100%) Granulation Quality: Pink Necrotic Amount: Small (1-33%) Necrotic Quality: Adherent Slough After Cleansing: No brino Yes Exposed Structure osed: No (Subcutaneous Tissue) Exposed: Yes osed: No osed: No ed: No d: No % Reduction in Area: 43% % Reduction in Volume: 42.4% Epithelialization: Small (1-33%) Tunneling: No Undermining: No Foul Odor Slough/Fi Fascia Exp Fat Layer Tendon Exp Muscle Exp Joint Expos Bone Expose Electronic Signature(s) Signed: 11/03/2019 4:28:10 PM By: [name] EMT/HBOT Entered By: [name] on 11/03/2019 13:27:00 -------------------------------------------------------------------------------- Wound Assessment Details Patient Name: Date of Service: [name] 10/30/2019 4:00 PM Medical Record Number:[id number] Patient Account Number: [id number] Date of Birth/Sex: Treating RN: Dec 18, 1940 (79 y.o.  F) Primary Care Rayjon Wery: [name] Other Clinician: Referring Jaryiah Mehlman: Treating Mckayla Mulcahey/[name]:[name], [name], [name] Weeks in Treatment: 15 Wound Status Wound Number: 7 Primary Diabetic Wound/Ulcer of the Lower Extremity Etiology: Wound Location: Right Amputation Site - Transmetatarsal Wound Open Status: Wounding Event: Surgical Injury Comorbid Deep Vein Thrombosis, Hypertension, Date Acquired: 08/07/2019 History: Peripheral Arterial Disease, Type II Diabetes Weeks Of Treatment: 9 Clustered Wound: No Photos Wound Measurements Length: (cm) 0.8 Width: (cm) 2.4 Depth: (cm) 0.2 Area: (cm) 1.508 Volume: (cm) 0.302 Wound Description Classification: Grade 4 Wound Margin: Well defined, not attached Exudate Amount: Medium Exudate Type: Serosanguineous Exudate Color: red, brown Wound Bed Granulation Amount: Medium (34-66%) Granulation Quality: Pink Necrotic Amount: Medium (34-66%) Necrotic Quality: Adherent Slough After Cleansing: No rino Yes Exposed Structure osed: No (Subcutaneous Tissue) Exposed: Yes osed: No osed: No sed: No ed: No % Reduction in Area: 85.5% % Reduction in Volume: 70.9% Epithelialization: Small (1-33%) Tunneling: No Undermining: No Foul Odor Slough/Fib Fascia Exp Fat Layer Tendon Exp Muscle Exp Joint Expo Bone Expos Electronic Signature(s) Signed: 11/03/2019 4:28:10 PM By: [name] EMT/HBOT Entered By: [name] on 11/03/2019 13:27:27 -------------------------------------------------------------------------------- Wound Assessment Details Patient Name: Date of Service: [name] 10/30/2019 4:00 PM Medical Record Number:[id number] Patient Account Number: [id number] Date of Birth/Sex: Treating RN: [birth year] (79 y.o. F) Primary Care Analia Zuk: [name] Other Clinician: Referring Dimitri Dsouza: Treating Ninoshka Wainwright/[name]:[name], [name], [name] Weeks in Treatment: 15 Wound  Status Wound Number: 8 Primary Diabetic Wound/Ulcer of the Lower Extremity Etiology: Wound Location: Left Toe Third Wound Open Wounding Event: Gradually Appeared Status: Date Acquired: 09/10/2019 Comorbid Deep Vein Thrombosis, Hypertension, Weeks Of Treatment: 7 History: Peripheral Arterial Disease, Type II Diabetes Clustered Wound: No Photos Wound Measurements Length: (cm) 0.4 Width: (cm) 0.5 Depth: (cm) 0.2 Area: (cm) 0.157 Volume: (cm) 0.031 Wound Description Classification: Grade 2 Wound Margin: Distinct, outline attached Exudate Amount: Small Exudate Type: Serosanguineous Exudate Color: red, brown Wound Bed Granulation Amount: Small (1-33%) Granulation Quality: Pink, Pale Necrotic Amount: Large (67-100%) Necrotic Quality: Adherent Slough After Cleansing: No brino Yes Exposed Structure osed: No (Subcutaneous Tissue) Exposed: Yes osed: No osed: No sed: No ed: No % Reduction in Area: 64.3% % Reduction in Volume: 29.5% Epithelialization: Small (1-33%) Tunneling: No Undermining: No Foul Odor Slough/Fi Fascia Exp Fat Layer Tendon Exp Muscle Exp Joint Expo Bone Expos Electronic Signature(s) Signed: 11/03/2019 4:28:10 PM By: [name] EMT/HBOT Entered By: [name] on 11/03/2019 13:13:31 -------------------------------------------------------------------------------- Wound Assessment Details Patient Name: Date of Service: [name] 10/30/2019 4:00 PM Medical Record Number:[id number] Patient Account Number: [id number] Date of Birth/Sex: Treating RN: Oct 24, 1940 (79 y.o. F) Primary Care Waneda Klammer: [name] Other Clinician: Referring Aziel Morgan: Treating Angeleah Labrake/[name]:[name], [name], [name] Weeks in Treatment: 15 Wound Status Wound Number: 9 Primary Diabetic Wound/Ulcer of the Lower Extremity Etiology: Wound Location: Left Achilles Wound Healed - Epithelialized Wounding Event: Trauma Status: Date Acquired:  10/15/2019 Comorbid Deep Vein Thrombosis, Hypertension, Weeks Of Treatment: 2 History: Peripheral Arterial Disease, Type II Diabetes Clustered Wound: No Photos Wound Measurements Length: (cm) 0 % Reductio Width: (cm) 0 % Reductio Depth: (cm) 0 Epithelial Area: (cm) 0 Tunneling Volume: (cm) 0 Undermini Wound Description Classification: Grade 1 Foul Odor Wound Margin: Flat and Intact Slough/F Exudate Amount: None Present Wound Bed Granulation Amount: None Present (0%) Necrotic Amount: None Present (0%) Fascia E Fat Laye Tendon E Muscle E Joint Ex Bone Exp Limited After Cleansing: No ibrino No Exposed Structure xposed: No r (Subcutaneous Tissue) Exposed: No xposed: No xposed: No posed: No osed: No to Skin Breakdown n in Area: 100% n in Volume: 100% ization: Large (67-100%) : No ng: No Electronic Signature(s) Signed: 11/03/2019 4:28:10 PM By: [name] EMT/HBOT Previous Signature: 10/30/2019 5:49:59 PM Version By: Kela Millin Entered By: [name] on 11/03/2019 13:16:53 -------------------------------------------------------------------------------- Vitals Details Patient Name: Date of Service: [name] 10/30/2019 4:00 PM Medical Record Number:[id number] Patient Account Number: [id number] Date of Birth/Sex: Treating RN: 25-May-1941 (79 y.o. [name] Primary Care Raeleen Winstanley: [name] Other Clinician: Referring Katha Kuehne: Treating Lemmie Vanlanen/[name]:[name], [name], [name] Weeks in Treatment: 15 Vital Signs Time Taken: 16:45 Temperature (F): 98.4 Height (in): 64 Pulse (bpm): 66 Weight (lbs): 142 Respiratory Rate (breaths/min): 16 Body Mass Index (BMI): 24.4 Blood Pressure (mmHg): 121/44 Capillary Blood Glucose (mg/dl): 147 Reference Range: 80 - 120 mg / dl Electronic Signature(s) Signed: 11/03/2019 5:30:42 PM By: [name] RN, BSN Entered By: [name] on 10/30/2019 16:55:00

## 2019-11-03 NOTE — Progress Notes (Addendum)
TANAKA, GILLEN ([id number]) Visit Report for 11/03/2019 HBO Details Patient Name: Date of Service: [name], POSTMA 11/03/2019 2:00 PM Medical Record Number:[id number] Patient Account Number: [id number] Date of Birth/Sex: Treating RN: March 12, 1941 (79 y.o. [name] Primary Care Keeven Matty: [name] Other Clinician: [name] Referring Forest Pruden: Treating Corneisha Alvi/[name]:[name], [name], [name] in Treatment: 16 HBO Treatment Course Details Treatment Course Number: 1 Ordering Mabell Esguerra: [name] Total Treatments Ordered: 40 HBO Treatment Start Date: 10/12/2019 HBO Indication: Diabetic Ulcer(s) of the Lower Extremity HBO Treatment Details Treatment Number: 17 Patient Type: Outpatient Chamber Type: Monoplace Chamber Serial #: [id number] Treatment Protocol: 2.5 ATA with 90 minutes oxygen, with two 5 minute air breaks Treatment Details Compression Rate Down: 2.0 psi / minute De-Compression Rate Up: 2.0 psi / minute Air breaks and CompressTx Pressure breathing periods DecompressDecompress Begins Reached (leave unused spaces Begins Ends blank) Chamber Pressure (ATA)1 2.5 2.5 2.5 2.5 2.5 --2.5 1 Clock Time (24 hr) 14:11 14:23 12:7517:0017:4944:96--75:91 16:15 Treatment Length: 124 (minutes) Treatment Segments: 4 Vital Signs Capillary Blood Glucose Reference Range: 80 - 120 mg / dl HBO Diabetic Blood Glucose Intervention Range: <131 mg/dl or >249 mg/dl Time Vitals Blood Respiratory Capillary Blood Glucose Pulse Action Type: Pulse: Temperature: Taken: Pressure: Rate: Glucose (mg/dl): Meter #: Oximetry (%) Taken: Pre 13:55 142/55 74 16 98.2 128 Post 16:17 162/48 79 18 97.8 107 Treatment Response Treatment Toleration: Well Treatment Completion Treatment Completed without Adverse Event Status: Additional Procedure Documentation Tissue Sevierity: Necrosis of bone Kaleya Douse Notes No concerns with treatment given Physician HBO Attestation: I  certify that I supervised this HBO treatment in accordance with Medicare guidelines. A trained Yes emergency response team is readily available per hospital policies and procedures. Continue HBOT as ordered. Yes Electronic Signature(s) Signed: 11/03/2019 5:08:08 PM By: [name] MD Previous Signature: 11/03/2019 4:28:10 PM Version By: [name] EMT/HBOT Entered By: [name] on 11/03/2019 17:05:33 -------------------------------------------------------------------------------- HBO Safety Checklist Details Patient Name: Date of Service: [name] 11/03/2019 2:00 PM Medical Record Number:[id number] Patient Account Number: [id number] Date of Birth/Sex: Treating RN: [birth year] (79 y.o. [name] Primary Care Calhoun Reichardt: [name] Other Clinician: [name] Referring Pranshu Lyster: Treating Shawnese Magner/[name]:[name], [name], [name] Weeks in Treatment: 16 HBO Safety Checklist Items Safety Checklist Consent Form Signed Patient voided / foley secured and emptied When did you last eato 1200 - [place] sammy / boost Last dose of injectable or oral agent metformin NA Ostomy pouch emptied and vented if applicable All implantable devices assessed, documented and approved Intravenous access site secured and place PICC line left arm Valuables secured Linens and cotton and cotton/polyester blend (less than 51% polyester) Personal oil-based products / skin lotions / body lotions removed Wigs or hairpieces removed NA Smoking or tobacco materials removed Books / newspapers / magazines / loose paper removed Cologne, aftershave, perfume and deodorant removed Jewelry removed (may wrap wedding band) Make-up removed Hair care products removed NA Battery operated devices (external) removed NA Heating patches and chemical warmers removed NA Titanium eyewear removed Nail polish cured greater than 10 hours NA Casting material cured greater than 10 hours NA  Hearing aids removed NA Loose dentures or partials removed NA Prosthetics have been removed Patient demonstrates correct use of air break device (if applicable) Patient concerns have been addressed Patient grounding bracelet on and cord attached to chamber Specifics for Inpatients (complete in addition to above) Medication sheet sent with patient Intravenous medications needed or due during therapy sent with patient Drainage tubes (e.g. nasogastric tube or  chest tube secured and vented) Endotracheal or Tracheotomy tube secured Cuff deflated of air and inflated with saline Airway suctioned Electronic Signature(s) Signed: 11/03/2019 2:18:45 PM By: [name] EMT/HBOT Entered By: [name] on 11/03/2019 14:18:44

## 2019-11-03 NOTE — Progress Notes (Signed)
ARAYLA, KRUSCHKE ([id number]) Visit Report for 11/03/2019 SuperBill Details Patient Name: Date of Service: [name], [name] 11/03/2019 Medical Record Number:[id number] Patient Account Number: [id number] Date of Birth/Sex: Treating RN: Jan 04, 1941 (79 y.o. 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, 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 Facility Procedures The patient participates with Medicare or their insurance follows the Medicare Facility Guidelines CPT4 Code Description Modifier Quantity [id number] G0277-(Facility Use Only) HBOT, full body chamber, 32min 4 Physician Procedures CPT4 Code Description Modifier Quantity [id number] 88280 - WC PHYS HYPERBARIC OXYGEN THERAPY 1 ICD-10 Diagnosis Description E11.621 Type 2 diabetes mellitus with foot ulcer Electronic Signature(s) Signed: 11/03/2019 4:28:10 PM By: [name] EMT/HBOT Signed: 11/03/2019 5:08:08 PM By: [name] MD Entered By: [name] on 11/03/2019 16:27:10

## 2019-11-03 NOTE — Progress Notes (Signed)
LYDA, COLCORD ([id number]) Visit Report for 11/03/2019 Arrival Information Details Patient Name: Date of Service: OVETTA, BAZZANO 11/03/2019 2:00 PM Medical Record Number:[id number] Patient Account Number: [id number] Date of Birth/Sex: Treating RN: 1941/06/24 (79 y.o. [name] Primary Care Britten Seyfried: [name] Other Clinician: [name] Referring Bianco Cange: Treating Lonita Debes/[name]:[name], [name], [name] Weeks in Treatment: 16 Visit Information History Since Last Visit Added or deleted any medications: No Patient Arrived: [name]: No Arrival Time: 13:50 Had a fall or experienced change in No Accompanied By: self activities of daily living that may affect Transfer Assistance: None risk of falls: Patient Identification Verified: Yes Signs or symptoms of abuse/neglect since last No Secondary Verification Process Yes visito Completed: Hospitalized since last visit: No Patient Requires Transmission- No Implantable device outside of the clinic excluding No Based Precautions: cellular tissue based products placed in the center Patient Has Alerts: Yes since last visit: Patient Alerts: Right ABI .62 TBI Pain Present Now: No refused Left ABI .60 TBI .14 Electronic Signature(s) Signed: 11/03/2019 4:28:10 PM By: [name] EMT/HBOT Entered By: [name] on 11/03/2019 14:17:38 -------------------------------------------------------------------------------- Encounter Discharge Information Details Patient Name: Date of Service: [name] 11/03/2019 2:00 PM Medical Record Number:[id number] Patient Account Number: [id number] Date of Birth/Sex: Treating RN: Jan 24, 1941 (79 y.o. [name] Primary Care Stephnie Parlier: [name] Other Clinician: [name] Referring Thomasena Vandenheuvel: Treating Haely Leyland/[name]:[name], [name], [name] in Treatment: 16 Encounter Discharge Information  Items Discharge Condition: Stable Ambulatory Status: Walker Discharge Destination: Home Transportation: Private Auto Accompanied By: self Schedule Follow-up Appointment: Yes Clinical Summary of Care: Patient Declined Electronic Signature(s) Signed: 11/03/2019 4:28:10 PM By: [name] EMT/HBOT Entered By: [name] on 11/03/2019 16:27:36 -------------------------------------------------------------------------------- Patient/Caregiver Education Details Patient Name: Date of Service: [name] 2/9/2021andnbsp2:00 PM Medical Record Number:[id number] Patient Account Number: [id number] Date of Birth/Gender: Treating RN: Nov 29, 1940 (78 y.o. [name] Primary Care Physician: [name] Other Clinician: [name] Referring Physician: Treating Physician/[name]:[name], [name], [name] in Treatment: 41 Education Assessment Education Provided To: Patient Education Topics Provided Hyperbaric Oxygenation: Methods: Explain/Verbal Responses: State content correctly Electronic Signature(s) Signed: 11/03/2019 4:28:10 PM By: [name] EMT/HBOT Entered By: [name] on 11/03/2019 16:27:25 -------------------------------------------------------------------------------- Vitals Details Patient Name: Date of Service: [name] 11/03/2019 2:00 PM Medical Record Number:[id number] Patient Account Number: [id number] Date of Birth/Sex: Treating RN: [birth year] (79 y.o. [name] Primary Care Evianna Chandran: [name] Other Clinician: [name] Referring Maryjo Ragon: Treating Phillis Thackeray/[name]:[name], [name], [name] Weeks in Treatment: 16 Vital Signs Time Taken: 13:55 Temperature (F): 98.2 Height (in): 64 Pulse (bpm): 74 Weight (lbs): 142 Respiratory Rate (breaths/min): 16 Body Mass Index (BMI): 24.4 Blood Pressure (mmHg): 142/55 Capillary Blood Glucose (mg/dl): 128 Reference Range: 80 - 120 mg /  dl Electronic Signature(s) Signed: 11/03/2019 4:28:10 PM By: [name] EMT/HBOT Entered By: [name] on 11/03/2019 14:17:56

## 2019-11-04 ENCOUNTER — Encounter (HOSPITAL_BASED_OUTPATIENT_CLINIC_OR_DEPARTMENT_OTHER): Payer: Medicare Other | Admitting: Physician Assistant

## 2019-11-04 ENCOUNTER — Other Ambulatory Visit: Payer: Self-pay

## 2019-11-04 DIAGNOSIS — D649 Anemia, unspecified: Secondary | ICD-10-CM | POA: Diagnosis not present

## 2019-11-04 DIAGNOSIS — I10 Essential (primary) hypertension: Secondary | ICD-10-CM | POA: Diagnosis not present

## 2019-11-04 DIAGNOSIS — E1152 Type 2 diabetes mellitus with diabetic peripheral angiopathy with gangrene: Secondary | ICD-10-CM | POA: Diagnosis not present

## 2019-11-04 DIAGNOSIS — L97514 Non-pressure chronic ulcer of other part of right foot with necrosis of bone: Secondary | ICD-10-CM | POA: Diagnosis not present

## 2019-11-04 DIAGNOSIS — L97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 limited to breakdown of skin: Secondary | ICD-10-CM | POA: Diagnosis not present

## 2019-11-04 DIAGNOSIS — L97518 Non-pressure chronic ulcer of other part of right foot with other specified severity: Secondary | ICD-10-CM | POA: Diagnosis not present

## 2019-11-04 DIAGNOSIS — E11621 Type 2 diabetes mellitus with foot ulcer: Secondary | ICD-10-CM | POA: Diagnosis not present

## 2019-11-04 DIAGNOSIS — L97528 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ity: Secondary | ICD-10-CM | POA: Diagnosis not present

## 2019-11-04 DIAGNOSIS — Z4781 Encounter for orthopedic aftercare following surgical amputation: Secondary | ICD-10-CM | POA: Diagnosis not present

## 2019-11-04 DIAGNOSIS — E1142 Type 2 diabetes mellitus with diabetic polyneuropathy: Secondary | ICD-10-CM | POA: Diagnosis not present

## 2019-11-04 LAB — GLUCOSE, CAPILLARY
Glucose-Capillary: 167 mg/dL — ABNORMAL HIGH (ref 70–99)
Glucose-Capillary: 144 mg/dL — ABNORMAL HIGH (ref 70–99)

## 2019-11-04 NOTE — Progress Notes (Addendum)
[name], [name] ([id number]) Visit Report for 11/04/2019 HBO Details Patient Name: Date of Service: [name], [name] 11/04/2019 2:00 PM Medical Record Number:[id number] Patient Account Number: [id number] Date of Birth/Sex: Treating RN: 11/24/1940 (79 y.o. [name], [name] Primary Care [name]: [name] Other Clinician: Mikeal [name] Referring Bellanie Matthew: Treating [name], [name], [name] in Treatment: 16 HBO Treatment Course Details Treatment Course Number: 1 Ordering Blaklee [place]: [name] Total Treatments Ordered: 40 HBO Treatment Start Date: 10/12/2019 HBO Indication: Diabetic Ulcer(s) of the Lower Extremity HBO Treatment Details Treatment Number: 18 Patient Type: Outpatient Chamber Type: Monoplace Chamber Serial #: [id number] Treatment Protocol: 2.5 ATA with 90 minutes oxygen, with two 5 minute air breaks Treatment Details Compression Rate Down: 2.0 psi / minute De-Compression Rate Up: 2.0 psi / minute Air breaks and CompressTx Pressure breathing periods DecompressDecompress Begins Reached (leave unused spaces Begins Ends blank) Chamber Pressure (ATA)1 2.5 2.5 2.5 2.5 2.5 --2.5 1 Clock Time (24 hr) 14:14 14:26 14:5615:0115:3115:36--16:06 16:18 Treatment Length: 124 (minutes) Treatment Segments: 4 Vital Signs Capillary Blood Glucose Reference Range: 80 - 120 mg / dl HBO Diabetic Blood Glucose Intervention Range: <131 mg/dl or >249 mg/dl Time Vitals Blood Respiratory Capillary Blood Glucose Pulse Action Type: Pulse: Temperature: Taken: Pressure: Rate: Glucose (mg/dl): Meter #: Oximetry (%) Taken: Pre 14:05 129/58 67 15 97.6 167 Post 16:20 147/54 67 17 98.2 144 Treatment Response Treatment Toleration: Well Treatment Completion Treatment Completed without Adverse Event Status: Additional Procedure Documentation Tissue Sevierity: Necrosis of bone Electronic Signature(s) Signed: 11/04/2019 4:31:42 PM By: [name]  EMT/HBOT Signed: 11/04/2019 6:32:46 PM By: [name] Keeler PA-C Entered By: [name] on 11/04/2019 16:30:29 -------------------------------------------------------------------------------- HBO Safety Checklist Details Patient Name: Date of Service: [name] 11/04/2019 2:00 PM Medical Record Number:[id number] Patient Account Number: [id number] Date of Birth/Sex: Treating RN: August 18, 1941 (79 y.o. [name], [name] Primary Care [name]: [name] Other Clinician: Mikeal [name] Referring [name]: Treating [name]/[name]:Stone [name], [name], [name] in Treatment: 16 HBO Safety Checklist Items Safety Checklist Consent Form Signed Patient voided / foley secured and emptied When did you last eato 1200 - [place]and[place] sammy/Boost Last dose of injectable or oral agent metformin NA Ostomy pouch emptied and vented if applicable All implantable devices assessed, documented and approved Intravenous access site secured and place PICC line left arm Valuables secured Linens and cotton and cotton/polyester blend (less than 51% polyester) Personal oil-based products / skin lotions / body lotions removed Wigs or hairpieces removed NA Smoking or tobacco materials removed Books / newspapers / magazines / loose paper removed Cologne, aftershave, perfume and deodorant removed Jewelry removed (may wrap wedding band) Make-up removed Hair care products removed NA Battery operated devices (external) removed NA Heating patches and chemical warmers removed NA Titanium eyewear removed Nail polish cured greater than 10 hours NA Casting material cured greater than 10 hours NA Hearing aids removed NA Loose dentures or partials removed NA Prosthetics have been removed Patient demonstrates correct use of air break device (if applicable) Patient concerns have been addressed Patient grounding bracelet on and cord attached to chamber Specifics for Inpatients (complete  in addition to above) Medication sheet sent with patient Intravenous medications needed or due during therapy sent with patient Drainage tubes (e.g. nasogastric tube or chest tube secured and vented) Endotracheal or Tracheotomy tube secured Cuff deflated of air and inflated with saline Airway suctioned Electronic Signature(s) Signed: 11/04/2019 2:17:12 PM By: [name] EMT/HBOT Entered By: [name] on 11/04/2019 14:17:11

## 2019-11-04 NOTE — Progress Notes (Signed)
JAZARAH, CAPILI ([id number]) Visit Report for 11/04/2019 Arrival Information Details Patient Name: Date of Service: [name], [name] 11/04/2019 2:00 PM Medical Record Number:[id number] Patient Account Number: [id number] Date of Birth/Sex: Treating RN: [birth year] (79 y.o. [name] Primary Care Brittley Regner: [name] Other Clinician: [name] Referring Orene Abbasi: Treating Juventino Pavone/[name]:Stone III, Alen Bleacher, [name] Weeks in Treatment: 16 Visit Information History Since Last Visit Added or deleted any medications: No Patient Arrived: Walker Any new allergies or adverse reactions: No Arrival Time: 14:00 Had a fall or experienced change in No Accompanied By: self activities of daily living that may affect Transfer Assistance: None risk of falls: Patient Identification Verified: Yes Signs or symptoms of abuse/neglect since last No Secondary Verification Process Yes visito Completed: Hospitalized since last visit: No Patient Requires Transmission- No Implantable device outside of the clinic excluding No Based Precautions: cellular tissue based products placed in the center Patient Has Alerts: Yes since last visit: Patient Alerts: Right ABI .62 TBI Pain Present Now: No refused Left ABI .60 TBI .14 Electronic Signature(s) Signed: 11/04/2019 4:31:42 PM By: [name] EMT/HBOT Entered By: [name] on 11/04/2019 14:16:01 -------------------------------------------------------------------------------- Encounter Discharge Information Details Patient Name: Date of Service: [name] 11/04/2019 2:00 PM Medical Record Number:[id number] Patient Account Number: [id number] Date of Birth/Sex: Treating RN: 02/09/1941 (79 y.o. [name] Primary Care Azul Coffie: [name] Other Clinician: [name] Referring Dama Hedgepeth: Treating Santasia Rew/[name]:Stone III, Alen Bleacher, [name] Weeks in Treatment: 42 Encounter Discharge  Information Items Discharge Condition: Stable Ambulatory Status: Walker Discharge Destination: Home Transportation: Private Auto Accompanied By: self Schedule Follow-up Appointment: Yes Clinical Summary of Care: Patient Declined Electronic Signature(s) Signed: 11/04/2019 4:31:42 PM By: [name] EMT/HBOT Entered By: [name] on 11/04/2019 16:31:05 -------------------------------------------------------------------------------- Patient/Caregiver Education Details Patient Name: Date of Service: [name] 2/10/2021andnbsp2:00 PM Medical Record Patient Account Number: [id number] Number: Treating RN: Baruch Gouty Date of Birth/Gender: 07/25/1941 (78 y.o. F) Other Clinician: [name] Primary Care Physician: [name] Treating Worthy Keeler Referring Physician: Physician/[name]: Darl Pikes in Treatment: 16 Education Assessment Education Provided To: Patient Education Topics Provided Hyperbaric Oxygenation: Methods: Explain/Verbal Responses: State content correctly Electronic Signature(s) Signed: 11/04/2019 4:31:42 PM By: [name] EMT/HBOT Entered By: [name] on 11/04/2019 16:30:52 -------------------------------------------------------------------------------- Vitals Details Patient Name: Date of Service: [name] 11/04/2019 2:00 PM Medical Record Number:[id number] Patient Account Number: [id number] Date of Birth/Sex: Treating RN: [birth year] (79 y.o. [name], [name] Primary Care Isaia Hassell: [name] Other Clinician: [name] Referring Sashay Felling: Treating Darsha Zumstein/[name]:Stone III, Alen Bleacher, [name] Weeks in Treatment: 16 Vital Signs Time Taken: 14:05 Temperature (F): 97.6 Height (in): 64 Pulse (bpm): 67 Weight (lbs): 142 Respiratory Rate (breaths/min): 15 Body Mass Index (BMI): 24.4 Blood Pressure (mmHg): 129/58 Capillary Blood Glucose (mg/dl): 167 Reference  Range: 80 - 120 mg / dl Electronic Signature(s) Signed: 11/04/2019 4:31:42 PM By: [name] EMT/HBOT Entered By: [name] on 11/04/2019 14:16:22

## 2019-11-04 NOTE — Progress Notes (Signed)
CINDI, GHAZARIAN ([id number]) Visit Report for 11/04/2019 SuperBill Details Patient Name: Date of Service: [name], ATIYEH 11/04/2019 Medical Record Number:[id number] Patient Account Number: [id number] Date of Birth/Sex: Treating RN: 26-Nov-1940 (79 y.o. 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 specified severity L97.528 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ity E11.42 Type 2 diabetes mellitus with diabetic polyneuropathy 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, 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 Facility Procedures The patient participates with Medicare or their insurance follows the Medicare Facility Guidelines CPT4 Code Description Modifier Quantity [id number] G0277-(Facility Use Only) HBOT, full body chamber, 33min 4 Physician Procedures CPT4 Code Description Modifier Quantity [id number] 35361 - WC PHYS HYPERBARIC OXYGEN THERAPY 1 ICD-10 Diagnosis Description E11.621 Type 2 diabetes mellitus with foot ulcer Electronic Signature(s) Signed: 11/04/2019 4:31:42 PM By: [name] EMT/HBOT Signed: 11/04/2019 6:32:46 PM By: Worthy Keeler PA-C Entered By: [name] on 11/04/2019 16:30:38

## 2019-11-05 ENCOUNTER — Other Ambulatory Visit: Payer: Self-pay

## 2019-11-05 ENCOUNTER — Encounter (HOSPITAL_BASED_OUTPATIENT_CLINIC_OR_DEPARTMENT_OTHER): Payer: Medicare Other | Admitting: Internal Medicine

## 2019-11-05 DIAGNOSIS — L97528 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ity: Secondary | ICD-10-CM | POA: Diagnosis not present

## 2019-11-05 DIAGNOSIS — L97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 limited to breakdown of skin: Secondary | ICD-10-CM | POA: Diagnosis not present

## 2019-11-05 DIAGNOSIS — E1142 Type 2 diabetes mellitus with diabetic polyneuropathy: Secondary | ICD-10-CM | POA: Diagnosis not present

## 2019-11-05 DIAGNOSIS — L97518 Non-pressure chronic ulcer of other part of right foot with other specified severity: Secondary | ICD-10-CM | POA: Diagnosis not present

## 2019-11-05 DIAGNOSIS — E1152 Type 2 diabetes mellitus with diabetic peripheral angiopathy with gangrene: Secondary | ICD-10-CM | POA: Diagnosis not present

## 2019-11-05 DIAGNOSIS — L97514 Non-pressure chronic ulcer of other part of right foot with necrosis of bone: Secondary | ICD-10-CM | POA: Diagnosis not present

## 2019-11-05 DIAGNOSIS — E11621 Type 2 diabetes mellitus with foot ulcer: Secondary | ICD-10-CM | POA: Diagnosis not present

## 2019-11-05 LAB — GLUCOSE, CAPILLARY
Glucose-Capillary: 123 mg/dL — ABNORMAL HIGH (ref 70–99)
Glucose-Capillary: 149 mg/dL — ABNORMAL HIGH (ref 70–99)

## 2019-11-05 NOTE — Progress Notes (Signed)
LOUANNE, CALVILLO ([id number]) Visit Report for 11/05/2019 Arrival Information Details Patient Name: Date of Service: BESS, SALTZMAN 11/05/2019 2:00 PM Medical Record Number:[id number] Patient Account Number: [id number] Date of Birth/Sex: Treating RN: Nov 03, 1940 (79 y.o. [name] Primary Care Zyron Deeley: [name] Other Clinician: [name] Referring Marley Pakula: Treating Muriah Harsha/[name]:[name], [name], [name] Weeks in Treatment: 16 Visit Information History Since Last Visit Added or deleted any medications: No Patient Arrived: [name]: No Arrival Time: 13:55 Had a fall or experienced change in No Accompanied By: self activities of daily living that may affect Transfer Assistance: None risk of falls: Patient Identification Verified: Yes Signs or symptoms of abuse/neglect since last No Secondary Verification Process Yes visito Completed: Hospitalized since last visit: No Patient Requires Transmission- No Implantable device outside of the clinic excluding No Based Precautions: cellular tissue based products placed in the center Patient Has Alerts: Yes since last visit: Patient Alerts: Right ABI .62 TBI Pain Present Now: No refused Left ABI .60 TBI .14 Electronic Signature(s) Signed: 11/05/2019 4:30:47 PM By: [name] EMT/HBOT Entered By: [name] on 11/05/2019 14:00:52 -------------------------------------------------------------------------------- Encounter Discharge Information Details Patient Name: Date of Service: [name] 11/05/2019 2:00 PM Medical Record Number:[id number] Patient Account Number: [id number] Date of Birth/Sex: Treating RN: 07-06-1941 (79 y.o. [name], [name] Primary Care Sydni Elizarraraz: [name] Other Clinician: [name] Referring Estefanny Moler: Treating Lorrena Goranson/[name]:[name], [name], [name] in Treatment: 16 Encounter Discharge  Information Items Discharge Condition: Stable Ambulatory Status: Walker Discharge Destination: Home Transportation: Private Auto Accompanied By: self Schedule Follow-up Appointment: Yes Clinical Summary of Care: Patient Declined Electronic Signature(s) Signed: 11/05/2019 4:30:47 PM By: [name] EMT/HBOT Entered By: [name] on 11/05/2019 16:30:16 -------------------------------------------------------------------------------- Patient/Caregiver Education Details Patient Name: Date of Service: Lahue, Leontyne H. 2/11/2021andnbsp2:00 PM Medical Record Patient Account Number: [id number] Number: Treating RN: Deon Pilling Date of Birth/Gender: 1941-01-17 (78 y.o. F) Other Clinician: [name] Primary Care Physician: Verna Czech Referring Physician: Physician/[name]: Darl Pikes in Treatment: 16 Education Assessment Education Provided To: Patient Education Topics Provided Hyperbaric Oxygenation: Methods: Explain/Verbal Responses: State content correctly Electronic Signature(s) Signed: 11/05/2019 4:30:47 PM By: [name] EMT/HBOT Entered By: [name] on 11/05/2019 16:30:03 -------------------------------------------------------------------------------- Vitals Details Patient Name: Date of Service: [name] 11/05/2019 2:00 PM Medical Record Number:[id number] Patient Account Number: [id number] Date of Birth/Sex: Treating RN: 1940-12-31 (79 y.o. [name], [name] Primary Care Vaughn Beaumier: [name] Other Clinician: [name] Referring Audon Heymann: Treating Vashti Bolanos/[name]:[name], [name], [name] Weeks in Treatment: 16 Vital Signs Time Taken: 14:00 Temperature (F): 97.3 Height (in): 64 Pulse (bpm): 76 Weight (lbs): 142 Respiratory Rate (breaths/min): 15 Body Mass Index (BMI): 24.4 Blood Pressure (mmHg): 126/50 Capillary Blood Glucose (mg/dl): 123 Reference  Range: 80 - 120 mg / dl Electronic Signature(s) Signed: 11/05/2019 4:30:47 PM By: [name] EMT/HBOT Entered By: [name] on 11/05/2019 14:01:10

## 2019-11-05 NOTE — Progress Notes (Addendum)
[name], [name] ([id number]) Visit Report for 11/05/2019 HBO Details Patient Name: Date of Service: [name], [name] 11/05/2019 2:00 PM Medical Record Number:[id number] Patient Account Number: [id number] Date of Birth/Sex: Treating RN: 1941/01/06 (79 y.o. [name], [name] Primary Care Lavella Myren: [name] Other Clinician: [name] Referring Ashaki Frosch: Treating Felishia Wartman/[name]:[name], [name], [name] Weeks in Treatment: 16 HBO Treatment Course Details Treatment Course Number: 1 Ordering Milady Fleener: [name] Total Treatments Ordered: 40 HBO Treatment Start Date: 10/12/2019 HBO Indication: Diabetic Ulcer(s) of the Lower Extremity HBO Treatment Details Treatment Number: 19 Patient Type: Outpatient Chamber Type: Monoplace Chamber Serial #: [id number] Treatment Protocol: 2.5 ATA with 90 minutes oxygen, with two 5 minute air breaks Treatment Details Compression Rate Down: 2.0 psi / minute De-Compression Rate Up: 2.0 psi / minute Air breaks and CompressTx Pressure breathing periods DecompressDecompress Begins Reached (leave unused spaces Begins Ends blank) Chamber Pressure (ATA)1 2.5 2.5 2.5 2.5 2.5 --2.5 1 Clock Time (24 hr) 14:13 14:25 72:5366:4403:4742:59--56:38 16:17 Treatment Length: 124 (minutes) Treatment Segments: 4 Vital Signs Capillary Blood Glucose Reference Range: 80 - 120 mg / dl HBO Diabetic Blood Glucose Intervention Range: <131 mg/dl or >249 mg/dl Time Vitals Blood Respiratory Capillary Blood Glucose Pulse Action Type: Pulse: Temperature: Taken: Pressure: Rate: Glucose (mg/dl): Meter #: Oximetry (%) Taken: Pre 14:00 126/50 76 15 97.3 123 Post 16:20 147/62 67 18 97.8 149 Treatment Response Treatment Toleration: Well Treatment Completion Treatment Completed without Adverse Event Status: Additional Procedure Documentation Tissue Sevierity: Necrosis of bone Faustine Tates Notes no concerns with treatment given Physician HBO  Attestation: I certify that I supervised this HBO treatment in accordance with Medicare guidelines. A trained Yes emergency response team is readily available per hospital policies and procedures. Continue HBOT as ordered. Yes Electronic Signature(s) Signed: 11/06/2019 1:06:39 PM By: [name] MD Previous Signature: 11/05/2019 4:30:47 PM Version By: [name] EMT/HBOT Entered By: [name] on 11/05/2019 19:12:18 -------------------------------------------------------------------------------- HBO Safety Checklist Details Patient Name: Date of Service: [name] 11/05/2019 2:00 PM Medical Record Number:[id number] Patient Account Number: [id number] Date of Birth/Sex: Treating RN: [birth year] (79 y.o. [name], [name] Primary Care Doxie Augenstein: [name] Other Clinician: [name] Referring Muna Demers: Treating Crystin Lechtenberg/[name]:[name], [name], [name] Weeks in Treatment: 16 HBO Safety Checklist Items Safety Checklist Consent Form Signed Patient voided / foley secured and emptied When did you last eato 1200 - tomato/rice soup Last dose of injectable or oral agent metformin NA Ostomy pouch emptied and vented if applicable All implantable devices assessed, documented and approved Intravenous access site secured and place PICC line left arm Valuables secured Linens and cotton and cotton/polyester blend (less than 51% polyester) Personal oil-based products / skin lotions / body lotions removed Wigs or hairpieces removed NA Smoking or tobacco materials removed Books / newspapers / magazines / loose paper removed Cologne, aftershave, perfume and deodorant removed Jewelry removed (may wrap wedding band) Make-up removed Hair care products removed NA Battery operated devices (external) removed NA Heating patches and chemical warmers removed NA Titanium eyewear removed Nail polish cured greater than 10 hours NA Casting material cured greater than  10 hours NA Hearing aids removed NA Loose dentures or partials removed NA Prosthetics have been removed Patient demonstrates correct use of air break device (if applicable) Patient concerns have been addressed Patient grounding bracelet on and cord attached to chamber Specifics for Inpatients (complete in addition to above) Medication sheet sent with patient Intravenous medications needed or due during therapy sent with patient Drainage tubes (e.g. nasogastric tube or chest tube  secured and vented) Endotracheal or Tracheotomy tube secured Cuff deflated of air and inflated with saline Airway suctioned Electronic Signature(s) Signed: 11/05/2019 2:02:25 PM By: [name] EMT/HBOT Entered By: [name] on 11/05/2019 14:02:23

## 2019-11-06 ENCOUNTER — Other Ambulatory Visit: Payer: Self-pay

## 2019-11-06 ENCOUNTER — Encounter (HOSPITAL_BASED_OUTPATIENT_CLINIC_OR_DEPARTMENT_OTHER): Payer: Medicare Other | Admitting: Internal Medicine

## 2019-11-06 DIAGNOSIS — L97526 Non-pressure chronic ulcer of other part of left foot with bone involvement without evidence of necrosis: Secondary | ICD-10-CM | POA: Diagnosis not present

## 2019-11-06 DIAGNOSIS — L97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 limited to breakdown of skin: Secondary | ICD-10-CM | POA: Diagnosis not present

## 2019-11-06 DIAGNOSIS — L97518 Non-pressure chronic ulcer of other part of right foot with other specified severity: Secondary | ICD-10-CM | POA: Diagnosis not present

## 2019-11-06 DIAGNOSIS — E11621 Type 2 diabetes mellitus with foot ulcer: Secondary | ICD-10-CM | POA: Diagnosis not present

## 2019-11-06 DIAGNOSIS — L97528 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ity: Secondary | ICD-10-CM | POA: Diagnosis not present

## 2019-11-06 DIAGNOSIS — E1142 Type 2 diabetes mellitus with diabetic polyneuropathy: Secondary | ICD-10-CM | POA: Diagnosis not present

## 2019-11-06 DIAGNOSIS — L97514 Non-pressure chronic ulcer of other part of right foot with necrosis of bone: Secondary | ICD-10-CM | POA: Diagnosis not present

## 2019-11-06 DIAGNOSIS — E1152 Type 2 diabetes mellitus with diabetic peripheral angiopathy with gangrene: Secondary | ICD-10-CM | POA: Diagnosis not present

## 2019-11-06 LAB — GLUCOSE, CAPILLARY
Glucose-Capillary: 124 mg/dL — ABNORMAL HIGH (ref 70–99)
Glucose-Capillary: 125 mg/dL — ABNORMAL HIGH (ref 70–99)

## 2019-11-06 NOTE — Progress Notes (Signed)
[name], [name] ([id number]) Visit Report for 11/05/2019 SuperBill Details Patient Name: Date of Service: [name], [name] 11/05/2019 Medical Record Number:[id number] Patient Account Number: [id number] Date of Birth/Sex: Treating RN: 07/28/1941 (79 y.o. 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 other specified severity L97.528 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ity E11.42 Type 2 diabetes mellitus with diabetic polyneuropathy 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, 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 Facility Procedures The patient participates with Medicare or their insurance follows the Medicare Facility Guidelines CPT4 Code Description Modifier Quantity [id number] G0277-(Facility Use Only) HBOT, full body chamber, 1min 4 Physician Procedures CPT4 Code Description Modifier Quantity 7035009 38182 - WC PHYS HYPERBARIC OXYGEN THERAPY 1 ICD-10 Diagnosis Description E11.621 Type 2 diabetes mellitus with foot ulcer Electronic Signature(s) Signed: 11/05/2019 4:30:47 PM By: [name] EMT/HBOT Signed: 11/06/2019 1:06:39 PM By: [name] MD Entered By: [name] on 11/05/2019 16:29:50

## 2019-11-06 NOTE — Progress Notes (Signed)
[place], VANZANDT ([id number]) Visit Report for 11/06/2019 SuperBill Details Patient Name: Date of Service: [name], [name] 11/06/2019 Medical Record Number:[id number] Patient Account Number: [id number] Date of Birth/Sex: Treating RN: 1941-03-01 (79 y.o. 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, 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 Facility Procedures The patient participates with Medicare or their insurance follows the Medicare Facility Guidelines CPT4 Code Description Modifier Quantity [id number] G0277-(Facility Use Only) HBOT, full body chamber, 69min 4 Physician Procedures CPT4 Code Description Modifier Quantity 2683419 62229 - WC PHYS HYPERBARIC OXYGEN THERAPY 1 ICD-10 Diagnosis Description E11.621 Type 2 diabetes mellitus with foot ulcer Electronic Signature(s) Signed: 11/06/2019 4:39:06 PM By: [name] EMT/HBOT Signed: 11/06/2019 5:45:04 PM By: [name] MD Entered By: [name] on 11/06/2019 16:38:13

## 2019-11-06 NOTE — Progress Notes (Signed)
[name], [name] ([id number]) Visit Report for 11/06/2019 Arrival Information Details Patient Name: Date of Service: [name], [name] 11/06/2019 2:00 PM Medical Record Number:[id number] Patient Account Number: [id number] Date of Birth/Sex: Treating RN: [birth year] [name] (79 y.o. [name] Primary Care Joelyn Lover: [name] Other Clinician: [name] Referring Roni Scow: Treating Moe Brier/[name]:[name], [name], [name] Weeks in Treatment: 16 Visit Information History Since Last Visit Added or deleted any medications: No Patient Arrived: [name]: No Arrival Time: 13:55 Had a fall or experienced change in No Accompanied By: self activities of daily living that may affect Transfer Assistance: None risk of falls: Patient Identification Verified: Yes Signs or symptoms of abuse/neglect since last No Secondary Verification Process Yes visito Completed: Hospitalized since last visit: No Patient Requires Transmission- No Implantable device outside of the clinic excluding No Based Precautions: cellular tissue based products placed in the center Patient Has Alerts: Yes since last visit: Patient Alerts: Right ABI .62 TBI Pain Present Now: No refused Left ABI .60 TBI .14 Electronic Signature(s) Signed: 11/06/2019 4:39:06 PM By: [name] EMT/HBOT Entered By: [name] on 11/06/2019 14:06:46 -------------------------------------------------------------------------------- Encounter Discharge Information Details Patient Name: Date of Service: [name] 11/06/2019 2:00 PM Medical Record Number:[id number] Patient Account Number: [id number] Date of Birth/Sex: Treating RN: [name], [name] (79 y.o. [name] Primary Care Nylene Inlow: [name] Other Clinician: [name] Referring Navea Woodrow: Treating Daschel Roughton/[name]:[name], [name], [name] in Treatment: 16 Encounter  Discharge Information Items Discharge Condition: Stable Ambulatory Status: Walker Discharge Destination: Home Transportation: Private Auto Accompanied By: self Schedule Follow-up Appointment: Yes Clinical Summary of Care: Patient Declined Electronic Signature(s) Signed: 11/06/2019 4:39:06 PM By: [name] EMT/HBOT Entered By: [name] on 11/06/2019 16:38:38 -------------------------------------------------------------------------------- Patient/Caregiver Education Details Patient Name: Date of Service: [name], [name] H. 2/12/2021andnbsp2:00 PM Medical Record Patient Account Number: [id number] Number: Treating RN: Kela Millin Date of Birth/Gender: [birth year][name] (79 y.o. F) Other Clinician: [name] Primary Care Physician: Verna Czech Referring Physician: Physician/[name]: Darl Pikes in Treatment: 16 Education Assessment Education Provided To: Patient Education Topics Provided Hyperbaric Oxygenation: Methods: Explain/Verbal Responses: State content correctly Electronic Signature(s) Signed: 11/06/2019 4:39:06 PM By: [name] EMT/HBOT Entered By: [name] on 11/06/2019 16:38:27 -------------------------------------------------------------------------------- Vitals Details Patient Name: Date of Service: [name] 11/06/2019 2:00 PM Medical Record Number:[id number] Patient Account Number: [id number] Date of Birth/Sex: Treating RN: [name] (79 y.o. [name] Primary Care Eathon Valade: [name] Other Clinician: [name] Referring Jawan Chavarria: Treating Kengo Sturges/[name]:[name], [name], [name] Weeks in Treatment: 16 Vital Signs Time Taken: 14:00 Temperature (F): 98.4 Height (in): 64 Pulse (bpm): 76 Weight (lbs): 142 Respiratory Rate (breaths/min): 15 Body Mass Index (BMI): 24.4 Blood Pressure (mmHg): 122/73 Capillary Blood Glucose (mg/dl):  124 Reference Range: 80 - 120 mg / dl Electronic Signature(s) Signed: 11/06/2019 4:39:06 PM By: [name] EMT/HBOT Entered By: [name] on 11/06/2019 14:07:02

## 2019-11-06 NOTE — Progress Notes (Addendum)
[name], [name] ([id number]) Visit Report for 11/06/2019 HBO Details Patient Name: Date of Service: [name], [name] 11/06/2019 2:00 PM Medical Record Number:[id number] Patient Account Number: [id number] Date of Birth/Sex: Treating RN: [birth year] (79 y.o. Hollie Salk, Shannon Primary Care Laythan Hayter: [name] Other Clinician: [name] Referring Loreley Schwall: Treating Timea Breed/[name]:[name], [name], [name] Weeks in Treatment: 16 HBO Treatment Course Details Treatment Course Number: 1 Ordering Andrell Bergeson: [name] Total Treatments Ordered: 40 HBO Treatment Start Date: 10/12/2019 HBO Indication: Diabetic Ulcer(s) of the Lower Extremity HBO Treatment Details Treatment Number: 20 Patient Type: Outpatient Chamber Type: Monoplace Chamber Serial #: [id number] Treatment Protocol: 2.5 ATA with 90 minutes oxygen, with two 5 minute air breaks Treatment Details Compression Rate Down: 2.0 psi / minute De-Compression Rate Up: 2.0 psi / minute Air breaks and CompressTx Pressure breathing periods DecompressDecompress Begins Reached (leave unused spaces Begins Ends blank) Chamber Pressure (ATA)1 2.5 2.5 2.5 2.5 2.5 --2.5 1 Clock Time (24 hr) 14:10 14:22 33:8250:5397:6734:19--37:90 16:14 Treatment Length: 124 (minutes) Treatment Segments: 4 Vital Signs Capillary Blood Glucose Reference Range: 80 - 120 mg / dl HBO Diabetic Blood Glucose Intervention Range: <131 mg/dl or >249 mg/dl Time Vitals Blood Respiratory Capillary Blood Glucose Pulse Action Type: Pulse: Temperature: Taken: Pressure: Rate: Glucose (mg/dl): Meter #: Oximetry (%) Taken: Pre 14:00 122/73 76 15 98.4 124 Post 16:17 157/81 73 15 98.6 125 Treatment Response Treatment Toleration: Well Treatment Completion Treatment Completed without Adverse Event Status: Additional Procedure Documentation Tissue Sevierity: Necrosis of bone Kalista Laguardia Notes No concerns with treatment given. Patient was also seen  for wound care evaluation Physician HBO Attestation: I certify that I supervised this HBO treatment in accordance with Medicare guidelines. A trained Yes emergency response team is readily available per hospital policies and procedures. Continue HBOT as ordered. Yes Electronic Signature(s) Signed: 11/06/2019 5:45:04 PM By: [name] Ham MD Previous Signature: 11/06/2019 4:39:06 PM Version By: [name] EMT/HBOT Entered By: [name] on 11/06/2019 17:08:27 -------------------------------------------------------------------------------- HBO Safety Checklist Details Patient Name: Date of Service: [name] 11/06/2019 2:00 PM Medical Record Number:[id number] Patient Account Number: [id number] Date of Birth/Sex: Treating RN: 1941-03-25 (79 y.o. Hollie Salk, Shannon Primary Care Kadee Philyaw: [name] Other Clinician: [name] Referring Doneisha Ivey: Treating Avyaan Summer/[name]:[name], [name], [name] Weeks in Treatment: 16 HBO Safety Checklist Items Safety Checklist Consent Form Signed Patient voided / foley secured and emptied When did you last eato 1200 - tomato/rice soup Last dose of injectable or oral agent metformin NA Ostomy pouch emptied and vented if applicable All implantable devices assessed, documented and approved Intravenous access site secured and place PICC line left arm Valuables secured Linens and cotton and cotton/polyester blend (less than 51% polyester) Personal oil-based products / skin lotions / body lotions removed Wigs or hairpieces removed NA Smoking or tobacco materials removed Books / newspapers / magazines / loose paper removed Cologne, aftershave, perfume and deodorant removed Jewelry removed (may wrap wedding band) Make-up removed Hair care products removed NA Battery operated devices (external) removed NA Heating patches and chemical warmers removed NA Titanium eyewear removed Nail polish cured greater than 10  hours NA Casting material cured greater than 10 hours NA Hearing aids removed NA Loose dentures or partials removed NA Prosthetics have been removed Patient demonstrates correct use of air break device (if applicable) Patient concerns have been addressed Patient grounding bracelet on and cord attached to chamber Specifics for Inpatients (complete in addition to above) Medication sheet sent with patient Intravenous medications needed or due during therapy sent with patient  Drainage tubes (e.g. nasogastric tube or chest tube secured and vented) Endotracheal or Tracheotomy tube secured Cuff deflated of air and inflated with saline Airway suctioned Electronic Signature(s) Signed: 11/06/2019 2:11:07 PM By: [name] EMT/HBOT Entered By: [name] on 11/06/2019 14:11:07

## 2019-11-09 ENCOUNTER — Encounter (HOSPITAL_BASED_OUTPATIENT_CLINIC_OR_DEPARTMENT_OTHER): Payer: Medicare Other | Admitting: Internal Medicine

## 2019-11-09 ENCOUNTER — Other Ambulatory Visit: Payer: Self-pay

## 2019-11-09 DIAGNOSIS — Z4781 Encounter for orthopedic aftercare following surgical amputation: Secondary | ICD-10-CM | POA: Diagnosis not present

## 2019-11-09 DIAGNOSIS — L97518 Non-pressure chronic ulcer of other part of right foot with other specified severity: Secondary | ICD-10-CM | POA: Diagnosis not present

## 2019-11-09 DIAGNOSIS — L97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 limited to breakdown of skin: Secondary | ICD-10-CM | POA: Diagnosis not present

## 2019-11-09 DIAGNOSIS — E1142 Type 2 diabetes mellitus with diabetic polyneuropathy: Secondary | ICD-10-CM | POA: Diagnosis not present

## 2019-11-09 DIAGNOSIS — I10 Essential (primary) hypertension: Secondary | ICD-10-CM | POA: Diagnosis not present

## 2019-11-09 DIAGNOSIS — M869 Osteomyelitis, unspecified: Secondary | ICD-10-CM | POA: Diagnosis not present

## 2019-11-09 DIAGNOSIS — E1152 Type 2 diabetes mellitus with diabetic peripheral angiopathy with gangrene: Secondary | ICD-10-CM | POA: Diagnosis not present

## 2019-11-09 DIAGNOSIS — L97514 Non-pressure chronic ulcer of other part of right foot with necrosis of bone: Secondary | ICD-10-CM | POA: Diagnosis not present

## 2019-11-09 DIAGNOSIS — L97528 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ity: Secondary | ICD-10-CM | POA: Diagnosis not present

## 2019-11-09 DIAGNOSIS — D649 Anemia, unspecified: Secondary | ICD-10-CM | POA: Diagnosis not present

## 2019-11-09 DIAGNOSIS — E11621 Type 2 diabetes mellitus with foot ulcer: Secondary | ICD-10-CM | POA: Diagnosis not present

## 2019-11-09 LAB — GLUCOSE, CAPILLARY: Glucose-Capillary: 124 mg/dL — ABNORMAL HIGH (ref 70–99)

## 2019-11-09 NOTE — Progress Notes (Addendum)
TARIN, JOHNDROW ([id number]) Visit Report for 11/09/2019 HBO Details Patient Name: Date of Service: [name], [name] 11/09/2019 2:00 PM Medical Record Number:[id number] Patient Account Number: [id number] Date of Birth/Sex: Treating RN: [birth year] (79 y.o. [name] Primary Care Burnice Vassel: [name] Other Clinician: [name] Referring Mitzy Naron: Treating Torell Minder/[name]:[name], [name], [name] Weeks in Treatment: 17 HBO Treatment Course Details Treatment Course Number: 1 Ordering Teira Arcilla: [name] Total Treatments Ordered: 40 HBO Treatment Start Date: 10/12/2019 HBO Indication: Diabetic Ulcer(s) of the Lower Extremity HBO Treatment Details Treatment Number: 21 Patient Type: Outpatient Chamber Type: Monoplace Chamber Serial #: [id number] Treatment Protocol: 2.5 ATA with 90 minutes oxygen, with two 5 minute air breaks Treatment Details Compression Rate Down: 2.0 psi / minute De-Compression Rate Up: 2.0 psi / minute Air breaks and CompressTx Pressure breathing periods DecompressDecompress Begins Reached (leave unused spaces Begins Ends blank) Chamber Pressure (ATA)1 2.5 2.5 2.5 2.5 2.5 --2.5 1 Clock Time (24 hr) 14:09 14:21 [id number]:96--78:93 16:13 Treatment Length: 124 (minutes) Treatment Segments: 4 Vital Signs Capillary Blood Glucose Reference Range: 80 - 120 mg / dl HBO Diabetic Blood Glucose Intervention Range: <131 mg/dl or >249 mg/dl Time Vitals Blood Respiratory Capillary Blood Glucose Pulse Action Type: Pulse: Temperature: Taken: Pressure: Rate: Glucose (mg/dl): Meter #: Oximetry (%) Taken: Pre 13:55 146/95 84 16 98.2 136 Post 16:15 152/83 71 14 98.5 124 Treatment Response Treatment Toleration: Well Treatment Completion Treatment Completed without Adverse Event Status: Additional Procedure Documentation Tissue Sevierity: Necrosis of bone Neave Lenger Notes No concerns with treatment given Physician HBO  Attestation: I certify that I supervised this HBO treatment in accordance with Medicare guidelines. A trained Yes emergency response team is readily available per hospital policies and procedures. Continue HBOT as ordered. Yes Electronic Signature(s) Signed: 11/09/2019 6:16:05 PM By: [name] MD Previous Signature: 11/09/2019 4:24:03 PM Version By: [name] EMT/HBOT Entered By: [name] on 11/09/2019 18:14:20 -------------------------------------------------------------------------------- HBO Safety Checklist Details Patient Name: Date of Service: [name] 11/09/2019 2:00 PM Medical Record Number:[id number] Patient Account Number: [id number] Date of Birth/Sex: Treating RN: 12-06-1940 (79 y.o. [name] Primary Care Lillyan Hitson: [name] Other Clinician: [name] Referring Driana Dazey: Treating Renessa Wellnitz/[name]:[name], [name], [name] Weeks in Treatment: 17 HBO Safety Checklist Items Safety Checklist Consent Form Signed Patient voided / foley secured and emptied When did you last eato 1200 - PBandJ sammy/Boost Last dose of injectable or oral agent metformin NA Ostomy pouch emptied and vented if applicable All implantable devices assessed, documented and approved Intravenous access site secured and place PICC line left arm Valuables secured Linens and cotton and cotton/polyester blend (less than 51% polyester) Personal oil-based products / skin lotions / body lotions removed Wigs or hairpieces removed NA Smoking or tobacco materials removed Books / newspapers / magazines / loose paper removed Cologne, aftershave, perfume and deodorant removed Jewelry removed (may wrap wedding band) Make-up removed Hair care products removed NA Battery operated devices (external) removed NA Heating patches and chemical warmers removed NA Titanium eyewear removed Nail polish cured greater than 10 hours NA Casting material cured greater  than 10 hours NA Hearing aids removed NA Loose dentures or partials removed NA Prosthetics have been removed Patient demonstrates correct use of air break device (if applicable) Patient concerns have been addressed Patient grounding bracelet on and cord attached to chamber Specifics for Inpatients (complete in addition to above) Medication sheet sent with patient Intravenous medications needed or due during therapy sent with patient Drainage tubes (e.g. nasogastric tube or chest tube  secured and vented) Endotracheal or Tracheotomy tube secured Cuff deflated of air and inflated with saline Airway suctioned Electronic Signature(s) Signed: 11/09/2019 2:12:42 PM By: [name] EMT/HBOT Entered By: [name] on 11/09/2019 14:12:41

## 2019-11-09 NOTE — Progress Notes (Signed)
[name], [name] ([id number]) Visit Report for 11/09/2019 SuperBill Details Patient Name: Date of Service: [name], [name] 11/09/2019 Medical Record Number:[id number] Patient Account Number: [id number] Date of Birth/Sex: Treating RN: 15-Dec-1940 (79 y.o. 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, 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 Facility Procedures The patient participates with Medicare or their insurance follows the Medicare Facility Guidelines CPT4 Code Description Modifier Quantity [id number]-(Facility Use Only) HBOT, full body chamber, 5min 4 Physician Procedures CPT4 Code Description Modifier Quantity [id number] - WC PHYS HYPERBARIC OXYGEN THERAPY 1 ICD-10 Diagnosis Description E11.621 Type 2 diabetes mellitus with foot ulcer Electronic Signature(s) Signed: 11/09/2019 4:24:03 PM By: [name] EMT/HBOT Signed: 11/09/2019 6:16:05 PM By: [name] MD Entered By: [name] on 11/09/2019 16:23:00

## 2019-11-09 NOTE — Progress Notes (Signed)
[name], [name] ([id number]) Visit Report for 11/06/2019 Arrival Information Details Patient Name: Date of Service: [name], [name] 11/06/2019 4:00 PM Medical Record Number:[id number] Patient Account Number: [id number] Date of Birth/Sex: Treating RN: May 29, 1941 (79 y.o. [name] Primary Care Zohal Reny: [name] Other Clinician: Referring [name]: Treating Margalit Leece/[name]:[name], [name], [name] Weeks in Treatment: 16 Visit Information History Since Last Visit All ordered tests and consults were completed: No Patient Arrived: [name] Added or deleted any medications: No Arrival Time: 16:38 Any new allergies or adverse reactions: No Accompanied By: self Had a fall or experienced change in No Transfer Assistance: None activities of daily living that may affect Patient Identification Verified: Yes risk of falls: Secondary Verification Process Yes Signs or symptoms of abuse/neglect since last No Completed: visito Patient Requires Transmission- No Hospitalized since last visit: No Based Precautions: Implantable device outside of the clinic excluding No Patient Has Alerts: Yes cellular tissue based products placed in the center Patient Alerts: Right ABI .62 TBI since last visit: refused Has Dressing in Place as Prescribed: Yes Left ABI .60 TBI Pain Present Now: No .14 Electronic Signature(s) Signed: 11/06/2019 5:18:45 PM By: Carlene Coria RN Entered By: Carlene Coria on 11/06/2019 16:38:53 -------------------------------------------------------------------------------- Encounter Discharge Information Details Patient Name: Date of Service: [name] 11/06/2019 4:00 PM Medical Record Number:[id number] Patient Account Number: [id number] Date of Birth/Sex: Treating RN: 09/20/1941 (79 y.o. [name] Primary Care Bhumi Godbey: [name] Other Clinician: Referring Charne Mcbrien: Treating [name] Boorman/[name]:[name], [name],  [name] Weeks in Treatment: 22 Encounter Discharge Information Items Post Procedure Vitals Discharge Condition: Stable Temperature (F): 98.3 Ambulatory Status: Walker Pulse (bpm): 75 Discharge Destination: Home Respiratory Rate (breaths/min): 18 Transportation: Private Auto Blood Pressure (mmHg): 149/49 Accompanied By: self Schedule Follow-up Appointment: Yes Clinical Summary of Care: Electronic Signature(s) Signed: 11/06/2019 5:41:48 PM By: Deon Pilling Entered By: Deon Pilling on 11/06/2019 17:41:27 -------------------------------------------------------------------------------- Multi Wound Chart Details Patient Name: Date of Service: [name] 11/06/2019 4:00 PM Medical Record Number:[id number] Patient Account Number: [id number] Date of Birth/Sex: Treating RN: [birth year] (79 y.o. [name] Primary Care Kaisy Severino: [name] Other Clinician: Referring Harvir Patry: Treating Patrece Tallie/[name]:[name], [name], [name] Weeks in Treatment: 16 Vital Signs Height(in): 64 Pulse(bpm): 75 Weight(lbs): 142 Blood Pressure(mmHg): 149/49 Body Mass Index(BMI): 24 Temperature(F): 98.3 Respiratory 18 Rate(breaths/min): Photos: [1:No Photos] [10:No Photos] [7:No Photos] Wound Location: [1:Left, Dorsal Toe Fourth] [10:Right Amputation Site - Transmetatarsal - Lateral Transmetatarsal] [7:Right Amputation Site -] Wounding Event: [1:Blister] [10:Surgical Injury] [7:Surgical Injury] 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] [10:Lower Extremity] [7:Lower Extremity] Secondary Etiology: [1:Arterial Insufficiency Ulcer N/A] [7:N/A] Comorbid History: [1:Deep Vein Thrombosis, Hypertension, Peripheral Hypertension, Peripheral Hypertension, Peripheral Arterial Disease, Type II Diabetes] [10:Deep Vein Thrombosis, Arterial Disease, Type II Arterial Disease, Type II Diabetes] [7:Deep Vein   Thrombosis, Diabetes] Date Acquired: [1:09/16/2018] [10:10/16/2019] [7:08/07/2019] Weeks of Treatment: [1:16] [10:3] [7:10] Wound Status: [1:Open] [10:Healed - Epithelialized] [7:Open] Measurements L x W x D 1x0.5x0.3 [10:0x0x0] [7:0.4x2x0.2] (cm) Area (cm) : [1:0.393] [10:0] [7:0.628] Volume (cm) : [1:0.118] [10:0] [7:0.126] % Reduction in Area: [1:44.40%] [10:100.00%] [7:93.90%] % Reduction in Volume: -66.20% [10:100.00%] [7:87.80%] Classification: [1:Grade 2] [10:Grade 3] [7:Grade 4] Exudate Amount: [1:Small] [10:None Present] [7:Medium] Exudate Type: [1:Serosanguineous] [10:N/A] [7:Serosanguineous] Exudate Color: [1:red, brown] [10:N/A] [7:red, brown] Wound Margin: [1:Distinct, outline attached] [10:Distinct, outline attached] [7:Well defined, not attached] Granulation Amount: [1:Large (67-100%)] [10:None Present (0%)] [7:Medium (34-66%)] Granulation Quality: [1:Red] [10:N/A] [7:Pink] Necrotic Amount: [1:None Present (0%)] [10:None Present (0%)] [7:Medium (34-66%)]  Exposed Structures: [1:Fat Layer (Subcutaneous Tissue) Exposed: Yes Bone: Yes Fascia: No Tendon: No Muscle: No Joint: No] [10:Fascia: No Fat Layer (Subcutaneous Tissue) Exposed: No Tendon: No Muscle: No Joint: No Bone: No] [7:Fat Layer (Subcutaneous Tissue) Exposed: Yes  Fascia: No Tendon: No Muscle: No Joint: No Bone: No] Epithelialization: [1:Small (1-33%)] [10:Large (67-100%)] [7:Small (1-33%)] Debridement: [1:N/A] [10:N/A] [7:N/A] Pain Control: [1:N/A] [10:N/A] [7:N/A] Tissue Debrided: [1:N/A] [10:N/A] [7:N/A] Level: [1:N/A] [10:N/A] [7:N/A] Debridement Area (sq cm):N/A [10:N/A] [7:N/A] Instrument: [1:N/A] [10:N/A] [7:N/A] Bleeding: [1:N/A] [10:N/A] [7:N/A] Hemostasis Achieved: [1:N/A] [10:N/A] [7:N/A] Procedural Pain: [1:N/A] [10:N/A] [7:N/A] Post Procedural Pain: [1:N/A] [10:N/A] [7:N/A] Debridement Treatment N/A [10:N/A] [7:N/A] Response: Post Debridement [1:N/A] [10:N/A] [7:N/A] Measurements L x W x  D (cm) Post Debridement [1:N/A] [10:N/A] [7:N/A] Volume: (cm) Procedures Performed: N/A [1:8] [10:N/A 9] [7:N/A] Photos: [1:No Photos] [10:No Photos] [7:N/A] Wound Location: [1:Left Toe Third] [10:Left Achilles] [7:N/A] Wounding Event: [1:Gradually Appeared] [10:Trauma] [7:N/A] Primary Etiology: [1:Diabetic Wound/Ulcer of the Diabetic Wound/Ulcer of the N/A Lower Extremity] [10:Lower Extremity] Secondary Etiology: [1:N/A] [10:N/A] [7:N/A] Comorbid History: [1:Deep Vein Thrombosis, Hypertension, Peripheral Hypertension, Peripheral Arterial Disease, Type II Diabetes] [10:Deep Vein Thrombosis, Arterial Disease, Type II Diabetes] [7:N/A] Date Acquired: [1:09/10/2019] [10:10/15/2019] [7:N/A] Weeks of Treatment: [1:8] [10:3] [7:N/A] Wound Status: [1:Open] [10:Healed - Epithelialized] [7:N/A] Measurements L x W x D 0.5x0.7x0.3 [10:0x0x0] [7:N/A] (cm) Area (cm) : [1:0.275] [10:0] [7:N/A] Volume (cm) : [1:0.082] [10:0] [7:N/A] % Reduction in Area: [1:37.50%] [10:100.00%] [7:N/A] % Reduction in Volume: -86.40% [10:100.00%] [7:N/A] Classification: [1:Grade 2] [10:Grade 1] [7:N/A] Exudate Amount: [1:Small] [10:None Present] [7:N/A] Exudate Type: [1:Serosanguineous] [10:N/A] [7:N/A] Exudate Color: [1:red, brown] [10:N/A] [7:N/A] Wound Margin: [1:Distinct, outline attached Flat and Intact] [7:N/A] Granulation Amount: [1:Small (1-33%)] [10:None Present (0%)] [7:N/A] Granulation Quality: [1:Pink, Pale] [10:N/A] [7:N/A] Necrotic Amount: [1:Large (67-100%)] [10:None Present (0%)] [7:N/A] Exposed Structures: [1:Fat Layer (Subcutaneous Fascia: No Tissue) Exposed: Yes Fascia: No Tendon: No Muscle: No Joint: No Bone: No] [10:Fat Layer (Subcutaneous Tissue) Exposed: No Tendon: No Muscle: No Joint: No Bone: No Limited to Skin Breakdown] [7:N/A] Epithelialization: [1:Small (1-33%)] [10:Large (67-100%)] [7:N/A] Debridement: [1:Debridement - Excisional] [10:N/A] [7:N/A] Pre-procedure [1:17:10] [10:N/A]  [7:N/A] Verification/Time Out Taken: Pain Control: [1:Other] [10:N/A] [7:N/A] Tissue Debrided: [1:Bone] [10:N/A] [7:N/A] Level: [1:Skin/Subcutaneous Tissue/Muscle/Bone] [10:N/A] [7:N/A] Debridement Area (sq cm):0.35 [10:N/A] [7:N/A] Instrument: [1:Curette] [10:N/A] [7:N/A] Bleeding: [1:Minimum] [10:N/A] [7:N/A] Hemostasis Achieved: [1:Pressure] [10:N/A] [7:N/A] Procedural Pain: [1:0] [10:N/A] [7:N/A] Post Procedural Pain: [1:0] [10:N/A] [7:N/A] Debridement Treatment Procedure was tolerated [10:N/A] [7:N/A] Response: [1:well] Post Debridement [1:0.5x0.7x0.3] [10:N/A] [7:N/A] Measurements L x W x D (cm) Post Debridement [1:0.082] [10:N/A] [7:N/A] Volume: (cm) Procedures Performed: Debridement [10:N/A] [7:N/A] Treatment Notes Electronic Signature(s) Signed: 11/06/2019 5:45:04 PM By: [name] MD Signed: 11/09/2019 6:12:41 PM By: Kela Millin Entered By: [name] on 11/06/2019 17:20:37 -------------------------------------------------------------------------------- Multi-Disciplinary Care Plan Details Patient Name: Date of Service: [name] 11/06/2019 4:00 PM Medical Record Number:[id number] Patient Account Number: [id number] Date of Birth/Sex: Treating RN: 1940/12/03 (79 y.o. 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 Potential for barotraumas to ears, sinuses, teeth, and lungs or cerebral gas embolism related to changes in atmospheric pressure inside hyperbaric oxygen chamber Goals: Barotrauma will be prevented during HBO2 Date Initiated: 10/01/2019 Target Resolution Date: 12/04/2019 Goal Status: Active Patient and/or family will be able to state/discuss factors appropriate to the  management of their disease process  during treatment Date Initiated: 10/01/2019 Target Resolution Date: 12/04/2019 Goal Status: Active Patient/caregiver will verbalize understanding of HBO goals, rationale, procedures and potential hazards Date Initiated: 10/01/2019 Date Inactivated: 10/22/2019 Target Resolution Date: 10/30/2019 Goal Status: Met Interventions: Assess and provide for patients comfort related to the hyperbaric environment and equalization of middle ear Assess patient for any history of confinement anxiety Notes: Abuse / Safety / Falls / Self Care Management Nursing Diagnoses: Potential for falls Goals: Patient will remain injury free related to falls Date Initiated: 08/27/2019 Target Resolution Date: 12/04/2019 Goal Status: Active Interventions: Provide education on fall prevention Notes: Pain, Acute or Chronic Nursing Diagnoses: Pain, acute or chronic: actual or potential Potential alteration in comfort, pain Goals: Patient will verbalize adequate pain control and receive pain control interventions during procedures as needed Date Initiated: 08/27/2019 Target Resolution Date: 12/04/2019 Goal Status: Active Interventions: Encourage patient to take pain medications as prescribed Provide education on pain management Reposition patient for comfort Notes: Electronic Signature(s) Signed: 11/09/2019 6:12:41 PM By: Kela Millin Entered By: Kela Millin on 11/06/2019 17:16:50 -------------------------------------------------------------------------------- Pain Assessment Details Patient Name: Date of Service: BRINLYNN, GORTON 11/06/2019 4:00 PM Medical Record Number:[id number] Patient Account Number: [id number] Date of Birth/Sex: Treating RN: 08/21/1941 (79 y.o. [name] Primary Care Marybell Robards: [name] Other Clinician: Referring Ameya Vowell: Treating Bryston Colocho/[name]:[name], [name], [name] Weeks in Treatment: 25 Active  Problems Location of Pain Severity and Description of Pain Patient Has Paino No Site Locations Pain Management and Medication Current Pain Management: Electronic Signature(s) Signed: 11/06/2019 5:18:45 PM By: Carlene Coria RN Entered By: Carlene Coria on 11/06/2019 16:40:05 -------------------------------------------------------------------------------- Patient/Caregiver Education Details Patient Name: Date of Service: Mcenaney, Novalynn H. 2/12/2021andnbsp4:00 PM Medical Record Number:[id number] Patient Account Number: [id number] Date of Birth/Gender: Treating RN: 11-15-1940 (79 y.o. [name] Primary Care Physician: [name] Other Clinician: Referring Physician: Treating Physician/[name]:[name], [name], [name] in Treatment: 33 Education Assessment Education Provided To: Patient Education Topics Provided Pain: Methods: Explain/Verbal Responses: State content correctly Safety: Methods: Explain/Verbal Responses: State content correctly Electronic Signature(s) Signed: 11/09/2019 6:12:41 PM By: Kela Millin Entered By: Kela Millin on 11/06/2019 17:17:12 -------------------------------------------------------------------------------- Wound Assessment Details Patient Name: Date of Service: IVON, ROEDEL 11/06/2019 4:00 PM Medical Record Number:[id number] Patient Account Number: [id number] Date of Birth/Sex: Treating RN: [birth year] (79 y.o. [name] Primary Care Teriah Muela: [name] Other Clinician: Referring Nicanor Mendolia: Treating Khaliya Golinski/[name]:[name], [name], [name] Weeks in Treatment: 16 Wound Status Wound Number: 1 Primary Diabetic Wound/Ulcer of the Lower Extremity Etiology: Wound Location: Left Toe Fourth - Dorsal Secondary Arterial Insufficiency Ulcer Wounding Event: Blister Etiology: Date Acquired: 09/16/2018 Wound Open Weeks Of Treatment: 16 Status: Clustered Wound: No Comorbid  Deep Vein Thrombosis, Hypertension, History: Peripheral Arterial Disease, Type II Diabetes Photos Wound Measurements Length: (cm) 1 % Reduct Width: (cm) 0.5 % Reduct Depth: (cm) 0.3 Epitheli Area: (cm) 0.393 Tunneli Volume: (cm) 0.118 Undermi Wound Description Classification: Grade 2 Wound Margin: Distinct, outline attached Exudate Amount: Small Exudate Type: Serosanguineous Exudate Color: red, brown Wound Bed Granulation Amount: Large (67-100%) Granulation Quality: Red Necrotic Amount: None Present (0%) Foul Odor After Cleansing: No Slough/Fibrino No Exposed Structure Fascia Exposed: No Fat Layer (Subcutaneous Tissue) Exposed: Yes Tendon Exposed: No Muscle Exposed: No Joint Exposed: No Bone Exposed: Yes ion in Area: 44.4% ion in Volume: -66.2% alization: Small (1-33%) ng: No ning: No Treatment Notes Wound #1 (Left, Dorsal Toe Fourth) 1. Cleanse With Wound Cleanser 3. Primary Dressing Applied Collegen AG Hydrogel or K-Y Jelly 4. Secondary Dressing Roll Gauze  5. Secured With Medipore tape 7. Footwear/Offloading device applied Surgical shoe Electronic Signature(s) Signed: 11/09/2019 4:24:03 PM By: [name] EMT/HBOT Signed: 11/09/2019 6:12:41 PM By: Kela Millin Entered By: [name] on 11/09/2019 13:42:49 -------------------------------------------------------------------------------- Wound Assessment Details Patient Name: Date of Service: [name] 11/06/2019 4:00 PM Medical Record Number:[id number] Patient Account Number: [id number] Date of Birth/Sex: Treating RN: 1941-03-25 (79 y.o. [name] Primary Care Monasia Lair: [name] Other Clinician: Referring Vincenzina Jagoda: Treating Cassondra Stachowski/[name]:[name], [name], [name] Weeks in Treatment: 16 Wound Status Wound Number: 10 Primary Diabetic Wound/Ulcer of the Lower Extremity Etiology: Wound Location: Right Amputation Site - Transmetatarsal - Lateral Wound  Healed - Epithelialized Status: Wounding Event: Surgical Injury Comorbid Deep Vein Thrombosis, Hypertension, Date Acquired: 10/16/2019 History: Peripheral Arterial Disease, Type II Diabetes Weeks Of Treatment: 3 Clustered Wound: No Photos Wound Measurements Length: (cm) 0 % Reductio Width: (cm) 0 % Reductio Depth: (cm) 0 Epithelial Area: (cm) 0 Tunneling Volume: (cm) 0 Undermini Wound Description Classification: Grade 3 Foul Odor Wound Margin: Distinct, outline attached Slough/Fi Exudate Amount: None Present Wound Bed Granulation Amount: None Present (0%) Necrotic Amount: None Present (0%) Fascia Ex Fat Layer Tendon Ex Muscle Ex Joint Exp Bone Expo After Cleansing: No brino Yes Exposed Structure posed: No (Subcutaneous Tissue) Exposed: No posed: No posed: No osed: No sed: No n in Area: 100% n in Volume: 100% ization: Large (67-100%) : No ng: No Electronic Signature(s) Signed: 11/09/2019 4:24:03 PM By: [name] EMT/HBOT Signed: 11/09/2019 6:12:41 PM By: Kela Millin Previous Signature: 11/06/2019 5:18:45 PM Version By: Carlene Coria RN Entered By: [name] on 11/09/2019 13:41:39 -------------------------------------------------------------------------------- Wound Assessment Details Patient Name: Date of Service: [name] 11/06/2019 4:00 PM Medical Record Number:[id number] Patient Account Number: [id number] Date of Birth/Sex: Treating RN: 1941/07/17 (79 y.o. [name] Primary Care Brigg Cape: [name] Other Clinician: Referring Carola Viramontes: Treating Daemion Mcniel/[name]:[name], [name], [name] Weeks in Treatment: 16 Wound Status Wound Number: 7 Primary Diabetic Wound/Ulcer of the Lower Extremity Etiology: Wound Location: Right Amputation Site - Transmetatarsal Wound Open Status: Wounding Event: Surgical Injury Comorbid Deep Vein Thrombosis, Hypertension, Date Acquired: 08/07/2019 History: Peripheral  Arterial Disease, Type II Diabetes Weeks Of Treatment: 10 Clustered Wound: No Photos Wound Measurements Length: (cm) 0.4 Width: (cm) 2 Depth: (cm) 0.2 Area: (cm) 0.628 Volume: (cm) 0.126 Wound Description Classification: Grade 4 Wound Margin: Well defined, not attached Exudate Amount: Medium Exudate Type: Serosanguineous Exudate Color: red, brown Wound Bed Granulation Amount: Medium (34-66%) Granulation Quality: Pink Necrotic Amount: Medium (34-66%) Necrotic Quality: Adherent Slough fter Cleansing: No ino Yes Exposed Structure sed: No Subcutaneous Tissue) Exposed: Yes sed: No sed: No ed: No d: No % Reduction in Area: 93.9% % Reduction in Volume: 87.8% Epithelialization: Small (1-33%) Tunneling: No Undermining: No Foul Odor A Slough/Fibr Fascia Expo Fat Layer ( Tendon Expo Muscle Expo Joint Expos Bone Expose Treatment Notes 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 Cleanse With Wound Cleanser 3. Primary Dressing Applied Collegen AG Hydrogel or K-Y Jelly 4. Secondary Dressing ABD Pad Roll Gauze 5. Secured With Medipore tape 7. 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 [name] Primary Care [name] [name]: [name] Other Clinician: Referring Sami Froh: Treating Jestin Burbach/[name]:[name],  [name], [name] Weeks in Treatment: 16 Wound Status Wound Number: 8 Primary Diabetic Wound/Ulcer of the Lower  Extremity Etiology: Wound Location: Left Toe Third Wound Open Wounding Event: Gradually Appeared Status: Date Acquired: 09/10/2019 Comorbid Deep Vein Thrombosis, Hypertension, Weeks Of Treatment: 8 History: Peripheral Arterial Disease, Type II Diabetes Clustered Wound: No Photos Wound Measurements Length: (cm) 0.5 Width: (cm) 0.7 Depth: (cm) 0.3 Area: (cm) 0.275 Volume: (cm) 0.082 Wound Description Classification: Grade 2 Wound Margin: Distinct, outline attached Exudate Amount: Small Exudate Type: Serosanguineous Exudate Color: red, brown Wound Bed Granulation Amount: Small (1-33%) Granulation Quality: Pink, Pale Necrotic Amount: Large (67-100%) Necrotic Quality: Adherent Slough After Cleansing: No brino Yes Exposed Structure osed: No (Subcutaneous Tissue) Exposed: Yes osed: No osed: No sed: No ed: No % Reduction in Area: 37.5% % Reduction in Volume: -86.4% Epithelialization: Small (1-33%) Tunneling: No Undermining: No Foul Odor Slough/Fi Fascia Exp Fat Layer Tendon Exp Muscle Exp Joint Expo Bone Expos Treatment Notes Wound #8 (Left Toe Third) 1. Cleanse With Wound Cleanser 3. Primary Dressing Applied Collegen AG Hydrogel or K-Y Jelly 4. Secondary Dressing Roll Gauze 5. Secured With Medipore tape 7. Footwear/Offloading device applied Surgical shoe Electronic Signature(s) Signed: 11/09/2019 4:24:03 PM By: [name] EMT/HBOT Signed: 11/09/2019 6:12:41 PM By: Kela Millin Entered By: [name] on 11/09/2019 13:48:30 -------------------------------------------------------------------------------- Wound Assessment Details Patient Name: Date of Service: [name] 11/06/2019 4:00 PM Medical Record Number:[id number] Patient Account Number: [id number] Date of Birth/Sex: Treating RN: 10/26/1940 (79 y.o. [name] Primary Care Catelynn Sparger: [name] Other Clinician: Referring Milanie Rosenfield: Treating  Bryden Darden/[name]:[name], [name], [name] Weeks in Treatment: 16 Wound Status Wound Number: 9 Primary Diabetic Wound/Ulcer of the Lower Extremity Etiology: Wound Location: Left Achilles Wound Healed - Epithelialized Wounding Event: Trauma Status: Date Acquired: 10/15/2019 Comorbid Deep Vein Thrombosis, Hypertension, Weeks Of Treatment: 3 History: Peripheral Arterial Disease, Type II Diabetes Clustered Wound: No Photos Wound Measurements Length: (cm) 0 % Reducti Width: (cm) 0 % Reducti Depth: (cm) 0 Epithelia Area: (cm) 0 Tunnelin Volume: (cm) 0 Undermin Wound Description Classification: Grade 1 Foul Odor Wound Margin: Flat and Intact Slough/Fi Exudate Amount: None Present Wound Bed Granulation Amount: None Present (0%) Necrotic Amount: None Present (0%) Fascia Ex Fat Layer Tendon Ex Muscle Ex Joint Exp Bone Expo Limited t After Cleansing: No brino No Exposed Structure posed: No (Subcutaneous Tissue) Exposed: No posed: No posed: No osed: No sed: No o Skin Breakdown on in Area: 100% on in Volume: 100% lization: Large (67-100%) g: No ing: No Electronic Signature(s) Signed: 11/09/2019 4:24:03 PM By: [name] EMT/HBOT Signed: 11/09/2019 6:12:41 PM By: Kela Millin Previous Signature: 11/06/2019 5:18:45 PM Version By: Carlene Coria RN Entered By: [name] on 11/09/2019 13:42:23 -------------------------------------------------------------------------------- Vitals Details Patient Name: Date of Service: [name] 11/06/2019 4:00 PM Medical Record Number:[id number] Patient Account Number: [id number] Date of Birth/Sex: Treating RN: 1941-09-05 (79 y.o. [name] Primary Care Rj Pedrosa: [name] Other Clinician: Referring Lawrie Tunks: Treating Quentez Lober/[name]:[name], [name], [name] Weeks in Treatment: 16 Vital Signs Time Taken: 16:39 Temperature (F): 98.3 Height (in): 64 Pulse (bpm): 75 Weight  (lbs): 142 Respiratory Rate (breaths/min): 18 Body Mass Index (BMI): 24.4 Blood Pressure (mmHg): 149/49 Reference Range: 80 - 120 mg / dl Electronic Signature(s) Signed: 11/06/2019 5:18:45 PM By: Carlene Coria RN Entered By: Carlene Coria on 11/06/2019 16:39:59

## 2019-11-09 NOTE — Progress Notes (Signed)
DESTINA, MANTEI ([id number]) Visit Report for 11/09/2019 Arrival Information Details Patient Name: Date of Service: [name], [name] 11/09/2019 2:00 PM Medical Record Number:[id number] Patient Account Number: [id number] Date of Birth/Sex: Treating RN: June 10, 1941 (79 y.o. [name] Primary Care [name]: [name] Other Clinician: [name] Referring Delmos Velaquez: Treating Goddess Gebbia/[name]:[name], [name], [name] Weeks in Treatment: 53 Visit Information History Since Last Visit Added or deleted any medications: No Patient Arrived: [name]: No Arrival Time: 13:50 Had a fall or experienced change in No Accompanied By: self activities of daily living that may affect Transfer Assistance: None risk of falls: Patient Identification Verified: Yes Signs or symptoms of abuse/neglect since last No Secondary Verification Process Yes visito Completed: Hospitalized since last visit: No Patient Requires Transmission- No Implantable device outside of the clinic excluding No Based Precautions: cellular tissue based products placed in the center Patient Has Alerts: Yes since last visit: Patient Alerts: Right ABI .62 TBI Pain Present Now: No refused Left ABI .60 TBI .14 Electronic Signature(s) Signed: 11/09/2019 4:24:03 PM By: [name] EMT/HBOT Entered By: [name] on 11/09/2019 14:11:39 -------------------------------------------------------------------------------- Encounter Discharge Information Details Patient Name: Date of Service: [name] 11/09/2019 2:00 PM Medical Record Number:[id number] Patient Account Number: [id number] Date of Birth/Sex: Treating RN: Sep 17, 1941 (79 y.o. [name] Primary Care Rahshawn Remo: [name] Other Clinician: [name] Referring Ahnesty Finfrock: Treating Talicia Sui/[name]:[name], [name], [name] in Treatment: 17 Encounter Discharge  Information Items Discharge Condition: Stable Ambulatory Status: Walker Discharge Destination: Home Transportation: Private Auto Accompanied By: self Schedule Follow-up Appointment: Yes Clinical Summary of Care: Patient Declined Electronic Signature(s) Signed: 11/09/2019 4:24:03 PM By: [name] EMT/HBOT Entered By: [name] on 11/09/2019 16:23:34 -------------------------------------------------------------------------------- Patient/Caregiver Education Details Patient Name: Date of Service: [name] 2/15/2021andnbsp2:00 PM Medical Record Patient Account Number: [id number] Number: Treating RN: [name] Date of Birth/Gender: 09-13-1941 (78 y.o. F) Other Clinician: [name] Primary Care Physician: Verna Czech Referring Physician: Physician/[name]: Darl Pikes in Treatment: 33 Education Assessment Education Provided To: Patient Education Topics Provided Hyperbaric Oxygenation: Methods: Explain/Verbal Responses: State content correctly Electronic Signature(s) Signed: 11/09/2019 4:24:03 PM By: [name] EMT/HBOT Entered By: [name] on 11/09/2019 16:23:12 -------------------------------------------------------------------------------- Vitals Details Patient Name: Date of Service: [name] 11/09/2019 2:00 PM Medical Record Number:[id number] Patient Account Number: [id number] Date of Birth/Sex: Treating RN: 03/08/1941 (79 y.o. [name] Primary Care Vong Garringer: [name] Other Clinician: [name] Referring Avalene Sealy: Treating Regnia Mathwig/[name]:[name], [name], [name] Weeks in Treatment: 17 Vital Signs Time Taken: 13:55 Temperature (F): 98.2 Height (in): 64 Pulse (bpm): 84 Weight (lbs): 142 Respiratory Rate (breaths/min): 16 Body Mass Index (BMI): 24.4 Blood Pressure (mmHg): 146/95 Capillary Blood Glucose (mg/dl): 136 Reference  Range: 80 - 120 mg / dl Electronic Signature(s) Signed: 11/09/2019 4:24:03 PM By: [name] EMT/HBOT Entered By: [name] on 11/09/2019 14:11:57

## 2019-11-09 NOTE — Progress Notes (Signed)
[name], [name] ([id number]) Visit Report for 11/06/2019 Debridement Details Patient Name: Date of Service: [name], [name] 11/06/2019 4:00 PM Medical Record Number:[id number] Patient Account Number: [id number] Date of Birth/Sex: Treating RN: 1940/12/09 (79 y.o. [name] Primary Care Provider: [name] Other Clinician: Referring Provider: Treating Provider/[name]:Jaisen Wiltrout, [name], [name] Weeks in Treatment: 16 Debridement Performed for Wound #8 Left Toe Third Assessment: Performed By: Physician Ricard Dillon., MD Debridement Type: Debridement Severity of Tissue Pre Bone involvement without necrosis Debridement: Level of Consciousness (Pre- Awake and Alert procedure): Pre-procedure Verification/Time Out Taken: Yes - 17:10 Start Time: 17:10 Pain Control: Other : benzocaine 20% spray Total Area Debrided (L x W): 0.5 (cm) x 0.7 (cm) = 0.35 (cm) Tissue and other material Viable, Non-Viable, Bone, Subcutaneous debrided: Level: Skin/Subcutaneous Tissue/Muscle/Bone Debridement Description: Excisional Instrument: Curette Bleeding: Minimum Hemostasis Achieved: Pressure End Time: 17:11 Procedural Pain: 0 Post Procedural Pain: 0 Response to Treatment: Procedure was tolerated well Level of Consciousness Awake and Alert (Post-procedure): Post Debridement Measurements of Total Wound Length: (cm) 0.5 Width: (cm) 0.7 Depth: (cm) 0.3 Volume: (cm) 0.082 Character of Wound/Ulcer Post Improved Debridement: Bone involvement without Severity of Tissue Post Debridement: necrosis Post Procedure Diagnosis Same as Pre-procedure Electronic Signature(s) Signed: 11/06/2019 5:45:04 PM By: [name] MD Signed: 11/09/2019 6:12:41 PM By: Kela Millin Entered By: [name] on 11/06/2019 17:24:12 -------------------------------------------------------------------------------- HPI Details Patient Name: Date of Service: [name]  11/06/2019 4:00 PM Medical Record Number:[id number] Patient Account Number: [id number] Date of Birth/Sex: Treating RN: [birth year] (79 y.o. [name] Primary Care Provider: [name] Other Clinician: Referring Provider: Treating Provider/[name]:Vondell Sowell, [name], [name] Weeks in Treatment: 16 History of Present Illness HPI Description: ADMISSION 07/13/2019 Patient is a 79 year old type II diabetic. She has known PAD. She has been followed by Dr. Jacqualyn Posey of podiatry for blistering areas on her toes dating back to 04/30/2019 which at this was the left fourth toe. By 8/11 she had wounds on the right and left second toes. She underwent arterial studies by Dr. Alvester Chou on 7/31 that showed ABIs on the right of 0.60 TBI of 0.26 on the left ABI of 0.56 and a TBI of 0.25. By 9/10 she had ischemic-looking wounds per Dr. Earleen Newport on the right first, left first second and third. She has been using Medihoney and then mupirocin more recently simply Betadine. The patient underwent angiography by Dr. Gwenlyn Found on 9/21. 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 Noted that there was 1 vessel runoff in the foot via the peroneal. On the left there was an 80% left SFA, 70% mid left SFA. There was a short segment calcified CTO distal less than SFA and above-knee popliteal artery reconstituting with two-vessel runoff. 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 It was felt that she had bilateral SFA disease as well as tibial vessel disease. An attempt was made to revascularize the left SFA but they were unable to cross because of the highly calcified nature of the lesion. 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 She has an area on the medial left fourth  toe with raised horned callus on top of this. I am not certain what this represents. With regards to pain she has about a 2-1/2 I will claudication tolerance in  the grocery store. She has some pain in night which is relieved by putting her feet down over the side of the bed. She is wearing Nitro-Dur patches on the top of her right foot. Past medical history includes type 2 diabetes with secondary PAD, neoplasm of the skin, diabetic retinopathy, carotid artery stenosis, hyperlipidemia hypertension. 11/2; the last time the patient was here I spoke to Dr. Gwenlyn Found about revascularization options on the right. As I understand things currently Dr. Gwenlyn Found spoke with Dr. Trula Slade and ultimately the patient was taken to surgery on 10/27. She had a right iliofemoral endarterectomy with a bovine patch angioplasty. I think the plan now is for her to have a staged intervention on the right SFA by Dr. Alvester Chou. Per the patient's understanding Dr. Gwenlyn Found and Dr. Jacqualyn Posey are waiting to see when the gangrenous toes on the right foot can be amputated. The patient states her pain is a lot better and she is grateful for that. 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 Small area on the left fourth toe. She is using Betadine to the mummified areas on the right and Medihoney on the left 08/27/2019; the last time I saw this patient I discharged her from the clinic. She had been revascularized by Dr. Trula Slade and she had a right iliofemoral endarterectomy with a bovine angioplasty. 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 Jacqualyn Posey of podiatry on 08/07/2019. I note that she also had a intervention by Dr. Gwenlyn Found and he performed a directional atherectomy and drug-coated balloon angioplasty of the SFA and popliteal artery on the right. I am not certain of the date of Dr. Kennon Holter procedure as of the time of this dictation. She was referred back to [place] by  Dr. Earleen Newport predominantly for follow-up of the left fourth toe. She still has sutures and stitches in the right TMA site. She states her pain is a lot better. She expresses concern about the condition of the amputation site at the TMA. She is on doxycycline I think prescribed by Dr. Earleen Newport. She is complaining of some pain at night 12/10; I spoke to Dr. Jacqualyn Posey last week. He removed the sutures on the right foot on Monday of this week. She has the area on the left fourth toe just proximal to the PIP and then the right TMA site. She is still on doxycycline and has enough through next week. Unfortunately the TMA site does not look good at all. Both on the lateral and medial part of the incisions are areas that probe to bone. There is purulence over the medial part which I have cultured. We will use silver alginate. Left fourth toe looks somewhat better but there was still exposed bone 12/17; patient has an MRI booked for 12/30. Culture I did last week showed Pseudomonas Serratia and Enterococcus. This was purulent drainage coming out of the medial part of her amputation site. I use cefdinir 300 twice daily for 10 days that started on 12/15. Her x-ray on the right showed limited evaluation for osteomyelitis. The findings could have been postoperative. There was subtle erosion in the distal first and distal fifth metatarsal. An MRI was suggested. On the left she had irregularity of the fourth middle and proximal phalanx consistent with a history of osteomyelitis. I do not know that she has a history of osteomyelitis in this area. She had a newly defined area on the plantar third toe 12/31; patient's  MRI is listed below: MRI OF THE RIGHT FOREFOOT WITHOUT AND WITH CONTRAST TECHNIQUE: Multiplanar, multisequence MR imaging of the right forefoot was performed before and after the administration of intravenous contrast. CONTRAST: 6 mL GADAVIST IV SOLN COMPARISON: Plain films right foot  09/04/2019. FINDINGS: Bones/Joint/Cartilage The patient is status post transmetatarsal amputation as seen on the prior exam. Marrow edema and enhancement are seen in all of the distal metatarsals. In the first metatarsal, signal change extends 1.5 cm proximal to the stump and in the second metatarsal extends approximately 2 cm proximal to the stump. Edema and enhancement are seen in only the distal 0.7 cm of the third metatarsal stump and tips of the fourth and fifth metatarsals. Bone marrow signal is otherwise unremarkable. A small focus of subchondral edema is seen in the lateral talus, likely degenerative. Ligaments Intact. Muscles and Tendons No intramuscular fluid collection. Soft tissues Skin ulceration is seen off the stump of the first metatarsal. A thin fluid collection tracks deep to the wound and over the anterior metatarsals worrisome for small abscess. Intense subcutaneous edema and enhancement are seen diffusely. 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 Cellulitis about the foot. Skin ulceration over the distal first metatarsal is identified with a thin fluid collection tracking anteriorly along the stump worrisome for abscess. Small focus of subchondral edema in the lateral talus is likely degenerative. Electronically Signed By: Inge Rise M.D. On: 09/23/2019 15:25 Patient arrives in clinic today not complaining any of any pain. 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 She does not describe claudication however her activity is very limited. 10/01/2019; since the patient was last here I have communicated with Dr. Gwenlyn Found after bypass by Dr. Trula Slade and addressing the right superficial femoral artery he states that she is widely patent through  peroneal arteries to the ankle with collaterals to the dorsalis pedis. He states he is going to talk to colleagues about the feasibility of tibial pedal access. The patient seems infectious disease later this afternoon Dr. Baxter Flattery. in preparation for this she has been off antibiotics for 1 week and I went ahead and obtain pieces of the remanence of her first metatarsal for pathology and CandS. The patient is a candidate for hyperbaric oxygen. 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 1/15; considerable improvement in the wounds on her feet. We are using silver collagen. She follows with Dr. Baxter Flattery of infectious disease and is on meropenem and daptomycin. She has been taught how to do this herself at home. Follows with Dr. Graylon Good at the end of treatment here. She has 2 wounds on the surgical TMA site 1 lateral and 1 medial the lateral 1 has regressed tremendously. The area medially still has some exposed bone although the base of this looks healthy. 1/22; 2 separate wounds on the surgical TMA site. Both of these look satisfactory. The medial area does not have any exposed bone. This is an improvement On the left side fourth toe dorsally over the proximal phalanx there is a deep punched out area that probes to bone. She has an area on the tip of the left third toe. She also tells [place] that in HBO she traumatized her left Achilles and this is left her with a superficial wound area 1/28; weekly visit along with HBO. She  has 5 wounds. To our punched out areas on the original TMA site on the right. Both of these appear to have contracted. The area on the right no longer has exposed bone. She has an area on the tip of the left third toe and on the DIP of the left fourth toe. Both of these had surface callus that I removed and unfortunately they have small areas that both go to bone. She has a traumatic wound on the left Achilles area that happened in HBO and  that appears better. She is tolerating her IV antibiotics well at home. She has home health changing the dressings and she is doing it once on the weekends. We have been using silver collagen. She has been extensively revascularized on the right by Dr. Trula Slade and subsequently by Dr. Gwenlyn Found. 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 Baxter Flattery of infectious disease on 2/17. Dr. Gwenlyn Found on 2/23 and according to her her antibiotics stopped on 2/23. She is tolerating hyperbarics well. She has made a tremendous improvement in the right forefoot with only 2 smaller open wounds. Using silver collagen. On the left foot she has the area on the tip of the left third toe and the medial part of the left fourth toe. These had exposed bone last week I did not sense any of that today 2/12; sees Dr. Graylon Good on 2/17 and Dr. Gwenlyn Found on 2/23. We are going to use Dermagraft on this today however the lateral part of her train TMA incision on the right is healed and the medial part is down so much that we just continue with the silver collagen She has wounds on the tip of her left third and the medial aspect of the left fourth. Both of these still have exposed bone I have not been able to get these to epithelialize. Electronic Signature(s) Signed: 11/06/2019 5:45:04 PM By: [name] MD Entered By: [name] on 11/06/2019 17:22:27 -------------------------------------------------------------------------------- Physical Exam Details Patient Name: Date of Service: [name] 11/06/2019 4:00 PM Medical Record Number:[id number] Patient Account Number: [id number] Date of Birth/Sex: Treating RN: 12-21-1940 (79 y.o. [name] Primary Care Provider: [name] Other Clinician: Referring Provider: Treating Provider/[name]:Alexya Mcdaris, [name],  [name] Weeks in Treatment: 46 Constitutional Patient is hypertensive.. Pulse regular and within target range for patient.Marland Kitchen Respirations regular, non-labored and within target range.. Temperature is normal and within the target range for the patient.Marland Kitchen Appears in no distress. Notes Wound exam; TMA site is healed laterally and the Achilles injury on the right is also healed. The medial part of the TMA site is still open somewhat senescent looking but I think everything is so much smaller here I could not justify putting the Dermagraft on this On the left side bone debridement of both the left third toe and the medial part of the left fourth toe to see if we can stimulate some coverage over these areas. Electronic Signature(s) Signed: 11/06/2019 5:45:04 PM By: [name] MD Entered By: [name] on 11/06/2019 17:23:37 -------------------------------------------------------------------------------- Physician Orders Details Patient Name: Date of Service: [name] 11/06/2019 4:00 PM Medical Record Number:[id number] Patient Account Number: [id number] Date of Birth/Sex: Treating RN: 10/18/1940 (79 y.o. 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 - after HBO Dressing Change Frequency Change Dressing every other day. - home health to change twice a week. wound center weekly. Wound Cleansing Clean wound with Wound Cleanser Primary Wound Dressing Wound #1 Left,Dorsal Toe Fourth Silver Collagen - moisten with hydrogel. Wound #7 Right Amputation Site - Transmetatarsal Silver Collagen - moisten with hydrogel. Wound #8 Left Toe Third Silver Collagen - moisten with hydrogel Secondary Dressing Wound #1 Left,Dorsal Toe Fourth Kerlix/Rolled Gauze Wound #7 Right Amputation Site - Transmetatarsal Kerlix/Rolled Gauze ABD pad Wound #8 Left Toe Third Kerlix/Rolled Gauze Dry Gauze Edema Control Avoid standing for long periods of time Elevate legs to the level of the heart or above for 30 minutes daily and/or when sitting, a frequency of: - throughout the day. Off-Loading DH Walker Boot to: - right foot Open toe surgical shoe to: - felt in surgical shoe for 3rd toe. wound center to provide patient with shoe today. [place] skilled nursing for wound care. - [facility] twice a week. Electronic Signature(s) Signed: 11/06/2019 5:45:04 PM By: [name] MD Signed: 11/09/2019 6:12:41 PM By: Kela Millin Entered By: Kela Millin on 11/06/2019 17:24:11 -------------------------------------------------------------------------------- Problem List Details Patient Name: Date of Service: [name] 11/06/2019 4:00 PM Medical Record Number:[id number] Patient Account Number: [id number] Date of Birth/Sex: Treating RN: 1940-10-13 (79 y.o. [name] Primary Care Provider: [name] Other Clinician: Referring Provider: Treating Provider/[name]:Akasha Melena, [name], [name] Weeks in Treatment: 69 Active Problems ICD-10 Evaluated Encounter Code Description Active Date Today Diagnosis E11.621 Type 2 diabetes  mellitus with foot ulcer 07/13/2019 No Yes E11.52 Type 2 diabetes mellitus with diabetic peripheral 07/13/2019 No Yes angiopathy with gangrene L97.518 Non-pressure chronic ulcer of other part of right foot 07/13/2019 No Yes with other specified severity L97.528 Non-pressure chronic ulcer of other part of left foot 07/13/2019 No Yes with other specified severity E11.42 Type 2 diabetes mellitus with diabetic polyneuropathy 07/13/2019 No Yes 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 [name] Primary Care Provider: [name] Other Clinician: Referring Provider: Treating Provider/[name]:Benzion Mesta, [name], [name] Weeks in Treatment: 16 Subjective History of Present Illness (HPI) ADMISSION 07/13/2019 Patient is a 79 year old type II diabetic. She has known PAD. She has been followed by Dr. Jacqualyn Posey of podiatry for blistering areas on her toes dating back to 04/30/2019 which at this was the left fourth toe. By 8/11 she had wounds on the right and left second toes. She underwent arterial studies by Dr. Alvester Chou on 7/31 that showed ABIs on the right of 0.60 TBI of 0.26 on  the left ABI of 0.56 and a TBI of 0.25. By 9/10 she had ischemic-looking wounds per Dr. Earleen Newport on the right first, left first second and third. She has been using Medihoney and then mupirocin more recently simply  Betadine. The patient underwent angiography by Dr. Gwenlyn Found on 9/21. 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 Noted that there was 1 vessel runoff in the foot via the peroneal. ooOn the left there was an 80% left SFA, 70% mid left SFA. There was a short segment calcified CTO distal less than SFA and above-knee popliteal artery reconstituting with two-vessel runoff. 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 It was felt that she had bilateral SFA disease as well as tibial vessel disease. An attempt was made to revascularize the left SFA but they were unable to cross because of the highly calcified nature of the lesion. 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 She has an area on the medial left fourth toe with raised horned callus on top of this. I am not certain what this represents. With regards to pain she has about a 2-1/2 I will claudication tolerance in the grocery store. She has some pain in night which is relieved by putting her feet down over the side of the bed. She is wearing Nitro-Dur patches on the top of her right foot. Past medical history includes type 2 diabetes with secondary PAD, neoplasm of the skin, diabetic retinopathy, carotid artery stenosis, hyperlipidemia hypertension. 11/2; the last time the patient was here I spoke to Dr. Gwenlyn Found about revascularization options on the right. As I understand things currently Dr. Gwenlyn Found spoke with Dr. Trula Slade and ultimately the patient was taken to surgery on 10/27. She had a right iliofemoral endarterectomy with a bovine patch angioplasty. I think the plan now is for her to have a staged intervention on the right SFA by Dr. Alvester Chou. Per the patient's understanding Dr. Gwenlyn Found and Dr. Jacqualyn Posey are waiting to  see when the gangrenous toes on the right foot can be amputated. The patient states her pain is a lot better and she is grateful for that. 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 Small area on the left fourth toe. She is using Betadine to the mummified areas on the right and Medihoney on the left 08/27/2019; the last time I saw this patient I discharged her from the clinic. She had been revascularized by Dr. Trula Slade and she had a right iliofemoral endarterectomy with a bovine angioplasty. 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 Jacqualyn Posey of podiatry on 08/07/2019. I note that she also had a intervention by Dr. Gwenlyn Found and he performed a directional atherectomy and drug-coated balloon angioplasty of the SFA and popliteal artery on the right. I am not certain of the date of Dr. Kennon Holter procedure as of the time of this dictation. She was referred back to [place] by Dr. Earleen Newport predominantly for follow-up of the left fourth toe. She still has sutures and stitches in the right TMA site. She states her pain is a lot better. She expresses concern about the condition of the amputation site at the TMA. She is on doxycycline I think prescribed by Dr. Earleen Newport.  She is complaining of some pain at night 12/10; I spoke to Dr. Jacqualyn Posey last week. He removed the sutures on the right foot on Monday of this week. She has the area on the left fourth toe just proximal to the PIP and then the right TMA site. She is still on doxycycline and has enough through next week. Unfortunately the TMA site does not look good at all. Both on the lateral and medial part of the incisions are areas that probe to bone. There is purulence over the medial part which I have cultured. We will use silver alginate. Left fourth toe looks somewhat better but there was still exposed bone 12/17; patient has an MRI booked for 12/30. Culture I did last week showed  Pseudomonas Serratia and Enterococcus. This was purulent drainage coming out of the medial part of her amputation site. I use cefdinir 300 twice daily for 10 days that started on 12/15. Her x-ray on the right showed limited evaluation for osteomyelitis. The findings could have been postoperative. There was subtle erosion in the distal first and distal fifth metatarsal. An MRI was suggested. On the left she had irregularity of the fourth middle and proximal phalanx consistent with a history of osteomyelitis. I do not know that she has a history of osteomyelitis in this area. She had a newly defined area on the plantar third toe 12/31; patient's MRI is listed below: MRI OF THE RIGHT FOREFOOT WITHOUT AND WITH CONTRAST TECHNIQUE: Multiplanar, multisequence MR imaging of the right forefoot was performed before and after the administration of intravenous contrast. CONTRAST: 6 mL GADAVIST IV SOLN COMPARISON: Plain films right foot 09/04/2019. FINDINGS: Bones/Joint/Cartilage The patient is status post transmetatarsal amputation as seen on the prior exam. Marrow edema and enhancement are seen in all of the distal metatarsals. In the first metatarsal, signal change extends 1.5 cm proximal to the stump and in the second metatarsal extends approximately 2 cm proximal to the stump. Edema and enhancement are seen in only the distal 0.7 cm of the third metatarsal stump and tips of the fourth and fifth metatarsals. Bone marrow signal is otherwise unremarkable. A small focus of subchondral edema is seen in the lateral talus, likely degenerative. Ligaments Intact. Muscles and Tendons No intramuscular fluid collection. Soft tissues Skin ulceration is seen off the stump of the first metatarsal. A thin fluid collection tracks deep to the wound and over the anterior metatarsals worrisome for small abscess. Intense subcutaneous edema and enhancement are seen diffusely. 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 Cellulitis about the foot. Skin ulceration over the distal first metatarsal is identified with a thin fluid collection tracking anteriorly along the stump worrisome for abscess. Small focus of subchondral edema in the lateral talus is likely degenerative. Electronically Signed By: Inge Rise M.D. On: 09/23/2019 15:25 Patient arrives in clinic today not complaining any of any pain. 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 She does not describe claudication however her activity is very limited. 10/01/2019; since the patient was last here I have communicated with Dr. Gwenlyn Found after bypass by Dr. Trula Slade and addressing the right superficial femoral artery he states that she is widely patent through peroneal arteries to the ankle with collaterals to the dorsalis pedis. He states he is going to talk to colleagues about the feasibility of tibial pedal  access. The patient seems infectious disease later this afternoon Dr. Baxter Flattery. in preparation for this she has been off antibiotics for 1 week and I went ahead and obtain pieces of the remanence of her first metatarsal for pathology and CandS. The patient is a candidate for hyperbaric oxygen. 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 1/15; considerable improvement in the wounds on her feet. We are using silver collagen. She follows with Dr. Baxter Flattery of infectious disease and is on meropenem and daptomycin. She has been taught how to do this herself at home. Follows with Dr. Graylon Good at the end of treatment here. She has 2 wounds on the surgical TMA site 1 lateral and 1 medial the lateral 1 has regressed tremendously. The area medially still has some exposed bone although  the base of this looks healthy. 1/22; 2 separate wounds on the surgical TMA site. Both of these look satisfactory. The medial area does not have any exposed bone. This is an improvement On the left side fourth toe dorsally over the proximal phalanx there is a deep punched out area that probes to bone. She has an area on the tip of the left third toe. She also tells [place] that in HBO she traumatized her left Achilles and this is left her with a superficial wound area 1/28; weekly visit along with HBO. She has 5 wounds. To our punched out areas on the original TMA site on the right. Both of these appear to have contracted. The area on the right no longer has exposed bone. She has an area on the tip of the left third toe and on the DIP of the left fourth toe. Both of these had surface callus that I removed and unfortunately they have small areas that both go to bone. She has a traumatic wound on the left Achilles area that happened in HBO and that appears better. She is tolerating her IV antibiotics well at home. She has home health changing the dressings and she is doing it once on the weekends. We have been using silver collagen. She has been extensively revascularized on the right by Dr. Trula Slade and subsequently by Dr. Gwenlyn Found. 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 Baxter Flattery of infectious disease on 2/17. Dr. Gwenlyn Found on 2/23 and according to her her antibiotics stopped on 2/23. She is tolerating hyperbarics well. She has made a tremendous improvement in the right forefoot with only 2 smaller open wounds. Using silver collagen. On the left foot she has the area on the tip of the left third toe and the medial part of the left fourth toe. These had exposed bone last week I did not sense any of that today 2/12; sees Dr. Graylon Good on 2/17 and Dr. Gwenlyn Found on 2/23. We are going to  use Dermagraft on this today however the lateral part of her train TMA incision on the right is healed and the medial part is down so much that we just continue with the silver collagen She has wounds on the tip of her left third and the medial aspect of the left fourth. Both of these still have exposed bone I have not been able to get these to epithelialize. Objective Constitutional Patient is hypertensive.. Pulse regular and within target range for patient.Marland Kitchen Respirations regular, non-labored and within target range.. Temperature  is normal and within the target range for the patient.Marland Kitchen Appears in no distress. Vitals Time Taken: 4:39 PM, Height: 64 in, Weight: 142 lbs, BMI: 24.4, Temperature: 98.3 F, Pulse: 75 bpm, Respiratory Rate: 18 breaths/min, Blood Pressure: 149/49 mmHg. General Notes: Wound exam; TMA site is healed laterally and the Achilles injury on the right is also healed. The medial part of the TMA site is still open somewhat senescent looking but I think everything is so much smaller here I could not justify putting the Dermagraft on this ooOn the left side bone debridement of both the left third toe and the medial part of the left fourth toe to see if we can stimulate some coverage over these areas. Integumentary (Hair, Skin) Wound #1 status is Open. Original cause of wound was Blister. The wound is located on the Left,Dorsal Toe Fourth. The wound measures 1cm length x 0.5cm width x 0.3cm depth; 0.393cm^2 area and 0.118cm^3 volume. There is bone and Fat Layer (Subcutaneous Tissue) Exposed exposed. There is no tunneling or undermining noted. There is a small amount of serosanguineous drainage noted. The wound margin is distinct with the outline attached to the wound base. There is large (67-100%) red granulation within the wound bed. There is no necrotic tissue within the wound bed. Wound #10 status is Healed - Epithelialized. Original cause of wound was Surgical Injury. The  wound is located on the Right,Lateral Amputation Site - Transmetatarsal. The wound measures 0cm length x 0cm width x 0cm depth; 0cm^2 area and 0cm^3 volume. There is no tunneling or undermining noted. There is a none present amount of drainage noted. The wound margin is distinct with the outline attached to the wound base. There is no granulation within the wound bed. There is no necrotic tissue within the wound bed. Wound #7 status is Open. Original cause of wound was Surgical Injury. 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 The wound measures 0.4cm length x 2cm width x 0.2cm depth; 0.628cm^2 area and 0.126cm^3 volume. There is Fat Layer (Subcutaneous Tissue) Exposed exposed. There is no tunneling or undermining noted. There is a medium amount of serosanguineous drainage noted. The wound margin is well defined and not attached to the wound base. There is medium (34-66%) pink granulation within the wound bed. There is a medium (34-66%) amount of necrotic tissue within the wound bed including Adherent Slough. Wound #8 status is Open. Original cause of wound was Gradually Appeared. The wound is located on the Left Toe Third. The wound measures 0.5cm length x 0.7cm width x 0.3cm depth; 0.275cm^2 area and 0.082cm^3 volume. There is Fat Layer (Subcutaneous Tissue) Exposed exposed. There is no tunneling or undermining noted. There is a small amount of serosanguineous drainage noted. The wound margin is distinct with the outline attached to the wound base. There is small (1-33%) pink, pale granulation within the wound bed. There is a large (67-100%) amount of necrotic tissue within the wound bed including Adherent Slough. Wound #9 status is Healed - Epithelialized. Original cause of wound was Trauma. The wound is located on the Left Achilles. The wound measures 0cm length x 0cm width x 0cm depth; 0cm^2 area and 0cm^3 volume. The wound is limited to skin breakdown. There is no  tunneling or undermining noted. There is a none present amount of drainage noted. The wound margin is flat and intact. There is no granulation within the wound bed. There is no necrotic tissue within the wound bed. 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 There was a Excisional Skin/Subcutaneous Tissue/Muscle/Bone Debridement with a total area of 0.35 sq cm performed by Ricard Dillon., MD. With the following instrument(s): Curette to remove Viable and Non-Viable tissue/material. Material removed includes Bone,Subcutaneous Tissue and after achieving pain control using Other (benzocaine 20% spray). No specimens were taken. A time out was conducted at 17:10, prior to the start of the procedure. A Minimum amount of bleeding was controlled with Pressure. The procedure was tolerated well with a pain level of 0 throughout and a pain level of 0 following the procedure. Post Debridement Measurements: 0.5cm length x 0.7cm width x 0.3cm depth; 0.082cm^3 volume. Character of Wound/Ulcer Post Debridement is improved. Severity of Tissue Post Debridement is: Bone involvement without necrosis. Post procedure Diagnosis Wound #8: Same as Pre-Procedure Plan Follow-up Appointments: Return Appointment in 1 week. - after HBO Dressing  Change Frequency: Change Dressing every other day. - home health to change twice a week. wound center weekly. Wound Cleansing: Clean wound with Wound Cleanser Primary Wound Dressing: Wound #1 Left,Dorsal Toe Fourth: Silver Collagen - moisten with hydrogel. Wound #7 Right Amputation Site - Transmetatarsal: Silver Collagen - moisten with hydrogel. Wound #8 Left Toe Third: Silver Collagen - moisten with hydrogel Secondary Dressing: Wound #1 Left,Dorsal Toe Fourth: Kerlix/Rolled Gauze Wound #7 Right Amputation Site - Transmetatarsal: Kerlix/Rolled Gauze ABD pad Wound #8 Left Toe Third: Kerlix/Rolled Gauze Dry Gauze Edema Control: Avoid standing for long periods of time Elevate legs to the level of the heart or above for 30 minutes daily and/or when sitting, a frequency of: - throughout the day. Off-Loading: DH Walker Boot to: - right foot Open toe surgical shoe to: - felt in surgical shoe for 3rd toe. wound center to provide patient with shoe today. Home Health: [facility] for wound care. - [facility] twice a week. 1. I used silver collagen to the medial part of the right TMA and silver collagen to the toes to continue 2. I am reserving the thought of using Dermagraft on the right 3. 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 Kennon Holter notes on this and communicate with him before 2/23. 4. She is finishing her antibiotics for the osteomyelitis and sees Dr. Graylon Good on 2/17 5. I am not sure if it would be safe to amputate the toes based on blood flow. There are no pulses on the left foot present dorsalis pedis on the right foot but somewhat anemic Electronic Signature(s) Signed: 11/06/2019 5:45:04 PM By: [name] MD Entered By: [name] on 11/06/2019 17:25:25 -------------------------------------------------------------------------------- SuperBill Details Patient Name: Date of  Service: [name] 11/06/2019 Medical Record Number:[id number] Patient Account Number: [id number] Date of Birth/Sex: Treating RN: 04-20-1941 (79 y.o. [name] Primary Care Provider: [name] Other Clinician: Referring Provider: Treating Provider/[name]:Dorlene Footman, [name], [name] Weeks in Treatment: 16 Diagnosis Coding ICD-10  Codes Code Description E11.621 Type 2 diabetes mellitus with foot ulcer E11.52 Type 2 diabetes mellitus with diabetic peripheral angiopathy with gangrene L97.518 Non-pressure chronic ulcer of other part of right foot with other specified severity L97.528 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ity E11.42 Type 2 diabetes mellitus with diabetic polyneuropathy 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, 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 Facility Procedures The patient participates with Medicare or their insurance follows the Medicare Facility Guidelines: CPT4 Code Description Modifier Quantity [id number] 11044 - DEB BONE 20 SQ CM/< 1 ICD-10 Diagnosis Description ICD-10 Diagnosis Description L97.528  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ity Physician Procedures CPT4 Description: Code 2811886 Debridement; bone (includes epidermis, dermis, subQ tissue, muscle and/or fascia, if performed) 1st 20 sqcm or less ICD-10 Diagnosis Description L97.528 Non-pressure chronic ulcer of other part of left foot with other sp Modifier: ecified seve Quantity: 1 rity Electronic Signature(s) Signed: 11/06/2019 5:45:04 PM By: [name] MD Entered By: [name] on 11/06/2019 17:25:38

## 2019-11-10 ENCOUNTER — Encounter (HOSPITAL_BASED_OUTPATIENT_CLINIC_OR_DEPARTMENT_OTHER): Payer: Medicare Other | Admitting: Internal Medicine

## 2019-11-10 DIAGNOSIS — E11621 Type 2 diabetes mellitus with foot ulcer: Secondary | ICD-10-CM | POA: Diagnosis not present

## 2019-11-10 DIAGNOSIS — L97514 Non-pressure chronic ulcer of other part of right foot with necrosis of bone: Secondary | ICD-10-CM | POA: Diagnosis not present

## 2019-11-10 DIAGNOSIS — E1152 Type 2 diabetes mellitus with diabetic peripheral angiopathy with gangrene: Secondary | ICD-10-CM | POA: Diagnosis not present

## 2019-11-10 DIAGNOSIS — L97528 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ity: Secondary | ICD-10-CM | POA: Diagnosis not present

## 2019-11-10 DIAGNOSIS — L97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 limited to breakdown of skin: Secondary | ICD-10-CM | POA: Diagnosis not present

## 2019-11-10 DIAGNOSIS — L97518 Non-pressure chronic ulcer of other part of right foot with other specified severity: Secondary | ICD-10-CM | POA: Diagnosis not present

## 2019-11-10 DIAGNOSIS — E1142 Type 2 diabetes mellitus with diabetic polyneuropathy: Secondary | ICD-10-CM | POA: Diagnosis not present

## 2019-11-10 LAB — GLUCOSE, CAPILLARY
Glucose-Capillary: 136 mg/dL — ABNORMAL HIGH (ref 70–99)
Glucose-Capillary: 164 mg/dL — ABNORMAL HIGH (ref 70–99)

## 2019-11-10 NOTE — Progress Notes (Addendum)
[name], [name] ([id number]) Visit Report for 11/10/2019 HBO Details Patient Name: Date of Service: [name], [name] 11/10/2019 2:00 PM Medical Record Number:[id number] Patient Account Number: [id number] Date of Birth/Sex: Treating RN: Oct 04, 1940 (79 y.o. [name] Primary Care Tayana Shankle: [name] Other Clinician: [name] Referring Elika Godar: Treating Tarra Pence/[name]:[name], [name], [name] in Treatment: 17 HBO Treatment Course Details Treatment Course Number: 1 Ordering Braleigh Massoud: [name] Total Treatments Ordered: 40 HBO Treatment Start Date: 10/12/2019 HBO Indication: Diabetic Ulcer(s) of the Lower Extremity HBO Treatment Details Treatment Number: 22 Patient Type: Outpatient Chamber Type: Monoplace Chamber Serial #: [id number] Treatment Protocol: 2.5 ATA with 90 minutes oxygen, with two 5 minute air breaks Treatment Details Compression Rate Down: 2.0 psi / minute De-Compression Rate Up: 2.0 psi / minute Air breaks and CompressTx Pressure breathing periods DecompressDecompress Begins Reached (leave unused spaces Begins Ends blank) Chamber Pressure (ATA)1 2.5 2.5 2.5 2.5 2.5 --2.5 1 Clock Time (24 hr) 14:11 14:23 [id number]:13--24:40 16:15 Treatment Length: 124 (minutes) Treatment Segments: 4 Vital Signs Capillary Blood Glucose Reference Range: 80 - 120 mg / dl HBO Diabetic Blood Glucose Intervention Range: <131 mg/dl or >249 mg/dl Time Vitals Blood Respiratory Capillary Blood Glucose Pulse Action Type: Pulse: Temperature: Taken: Pressure: Rate: Glucose (mg/dl): Meter #: Oximetry (%) Taken: Pre 14:00 128/46 76 14 97.4 153 Post 16:17 132/44 72 15 97.9 164 Treatment Response Treatment Toleration: Well Treatment Completion Treatment Completed without Adverse Event Status: Additional Procedure Documentation Tissue Sevierity: Necrosis of bone Niobe Dick Notes No concerns with treatment given Physician HBO Attestation: I  certify that I supervised this HBO treatment in accordance with Medicare guidelines. A trained Yes emergency response team is readily available per hospital policies and procedures. Continue HBOT as ordered. Yes Electronic Signature(s) Signed: 11/10/2019 5:18:57 PM By: [name] MD Previous Signature: 11/10/2019 4:25:10 PM Version By: [name] EMT/HBOT Entered By: [name] on 11/10/2019 17:08:20 -------------------------------------------------------------------------------- HBO Safety Checklist Details Patient Name: Date of Service: [name] 11/10/2019 2:00 PM Medical Record Number:[id number] Patient Account Number: [id number] Date of Birth/Sex: Treating RN: [birth year] (79 y.o. [name] Primary Care Gaberial Cada: [name] Other Clinician: [name] Referring Matther Labell: Treating Karmela Bram/[name]:[name], [name], [name] Weeks in Treatment: 17 HBO Safety Checklist Items Safety Checklist Consent Form Signed Patient voided / foley secured and emptied 1200 - rice - cream of chicken When did you last eato soup Last dose of injectable or oral agent metformin Ostomy pouch emptied and vented if applicable NA All implantable devices assessed, documented and approved Intravenous access site secured and place PICC line left arm Valuables secured Linens and cotton and cotton/polyester blend (less than 51% polyester) Personal oil-based products / skin lotions / body lotions removed Wigs or hairpieces removed Smoking or tobacco materials removed NA Books / newspapers / magazines / loose paper removed Cologne, aftershave, perfume and deodorant removed Jewelry removed (may wrap wedding band) Make-up removed Hair care products removed Battery operated devices (external) removed NA Heating patches and chemical warmers removed NA Titanium eyewear removed NA Nail polish cured greater than 10 hours Casting material cured greater than  10 hours NA Hearing aids removed NA Loose dentures or partials removed NA Prosthetics have been removed NA Patient demonstrates correct use of air break device (if applicable) Patient concerns have been addressed Patient grounding bracelet on and cord attached to chamber Specifics for Inpatients (complete in addition to above) Medication sheet sent with patient Intravenous medications needed or due during therapy sent with patient Drainage tubes (e.g. nasogastric  tube or chest tube secured and vented) Endotracheal or Tracheotomy tube secured Cuff deflated of air and inflated with saline Airway suctioned Electronic Signature(s) Signed: 11/10/2019 2:00:47 PM By: [name] EMT/HBOT Entered By: [name] on 11/10/2019 14:00:47

## 2019-11-10 NOTE — Progress Notes (Signed)
RAVIN, BENDALL ([id number]) Visit Report for 11/10/2019 SuperBill Details Patient Name: Date of Service: [name], [name] 11/10/2019 Medical Record Number:[id number] Patient Account Number: [id number] Date of Birth/Sex: Treating RN: [birth year] (79 y.o. [name] Primary Care Provider: [name] Other Clinician: [name] Referring Provider: Treating Provider/[name]:Xinyi Batton, [name], [name] Weeks in Treatment: 17 Diagnosis Coding ICD-10 Codes Code Description E11.621 Type 2 diabetes mellitus with foot ulcer E11.52 Type 2 diabetes mellitus with diabetic peripheral angiopathy with gangrene L97.518 Non-pressure chronic ulcer of other part of right foot with other specified severity L97.528 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ity E11.42 Type 2 diabetes mellitus with diabetic polyneuropathy 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, 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 Facility Procedures The patient participates with Medicare or their insurance follows the Medicare Facility Guidelines CPT4 Code Description Modifier Quantity [id number] [id number]-(Facility Use Only) HBOT, full body chamber, 49min 4 Physician Procedures CPT4 Code Description Modifier Quantity [id number] 46659 - WC PHYS HYPERBARIC OXYGEN THERAPY 1 ICD-10 Diagnosis Description E11.621 Type 2 diabetes mellitus with foot ulcer Electronic Signature(s) Signed: 11/10/2019 4:25:10 PM By: [name] EMT/HBOT Signed: 11/10/2019 5:18:57 PM By: [name] MD Entered By: [name] on 11/10/2019 16:24:09

## 2019-11-10 NOTE — Progress Notes (Signed)
CHAVY, AVERA ([id number]) Visit Report for 11/10/2019 Arrival Information Details Patient Name: Date of Service: ABIMBOLA, AKI 11/10/2019 2:00 PM Medical Record Number:[id number] Patient Account Number: [id number] Date of Birth/Sex: Treating RN: 12/02/1940 (79 y.o. [name] Primary Care Salbador Fiveash: [name] Other Clinician: [name] Referring Trinidee Schrag: Treating Adrie Picking/[name]:[name], [name], [name] in Treatment: 17 Visit Information History Since Last Visit Added or deleted any medications: No Patient Arrived: [name]: No Arrival Time: 13:55 Had a fall or experienced change in No Accompanied By: self activities of daily living that may affect Transfer Assistance: None risk of falls: Patient Identification Verified: Yes Signs or symptoms of abuse/neglect since last No Secondary Verification Process Yes visito Completed: Hospitalized since last visit: No Patient Requires Transmission- No Implantable device outside of the clinic excluding No Based Precautions: cellular tissue based products placed in the center Patient Has Alerts: Yes since last visit: Patient Alerts: Right ABI .62 TBI Pain Present Now: No refused Left ABI .60 TBI .14 Electronic Signature(s) Signed: 11/10/2019 4:25:10 PM By: [name] EMT/HBOT Entered By: [name] on 11/10/2019 13:59:37 -------------------------------------------------------------------------------- Encounter Discharge Information Details Patient Name: Date of Service: [name] 11/10/2019 2:00 PM Medical Record Number:[id number] Patient Account Number: [id number] Date of Birth/Sex: Treating RN: 1941/08/20 (79 y.o. [name] Primary Care Daelin Haste: [name] Other Clinician: [name] Referring Kailee Essman: Treating Tyri Elmore/[name]:[name], [name], [name] in Treatment: 17 Encounter Discharge  Information Items Discharge Condition: Stable Ambulatory Status: Walker Discharge Destination: Home Transportation: Private Auto Accompanied By: self Schedule Follow-up Appointment: Yes Clinical Summary of Care: Patient Declined Electronic Signature(s) Signed: 11/10/2019 4:25:10 PM By: [name] EMT/HBOT Entered By: [name] on 11/10/2019 16:24:42 -------------------------------------------------------------------------------- Patient/Caregiver Education Details Patient Name: Date of Service: [name] 2/16/2021andnbsp2:00 PM Medical Record Patient Account Number: [id number] Number: Treating RN: Carlene Coria Date of Birth/Gender: 1941/06/24 (79 y.o. F) Other Clinician: [name] Primary Care Physician: Verna Czech Referring Physician: Physician/[name]: Darl Pikes in Treatment: 39 Education Assessment Education Provided To: Patient Education Topics Provided Hyperbaric Oxygenation: Methods: Explain/Verbal Responses: State content correctly Electronic Signature(s) Signed: 11/10/2019 4:25:10 PM By: [name] EMT/HBOT Entered By: [name] on 11/10/2019 16:24:24 -------------------------------------------------------------------------------- Vitals Details Patient Name: Date of Service: [name] 11/10/2019 2:00 PM Medical Record Number:[id number] Patient Account Number: [id number] Date of Birth/Sex: Treating RN: June 24, 1941 (79 y.o. [name] Primary Care Hula Tasso: [name] Other Clinician: [name] Referring Reino Lybbert: Treating Tyrese Ficek/[name]:[name], [name], [name] Weeks in Treatment: 17 Vital Signs Time Taken: 14:00 Temperature (F): 97.4 Height (in): 64 Pulse (bpm): 76 Weight (lbs): 142 Respiratory Rate (breaths/min): 14 Body Mass Index (BMI): 24.4 Blood Pressure (mmHg): 128/46 Capillary Blood Glucose (mg/dl): 153 Reference Range:  80 - 120 mg / dl Electronic Signature(s) Signed: 11/10/2019 4:25:10 PM By: [name] EMT/HBOT Entered By: [name] on 11/10/2019 13:59:54

## 2019-11-11 ENCOUNTER — Ambulatory Visit (INDEPENDENT_AMBULATORY_CARE_PROVIDER_SITE_OTHER): Payer: Medicare Other | Admitting: Internal Medicine

## 2019-11-11 ENCOUNTER — Other Ambulatory Visit: Payer: Self-pay

## 2019-11-11 ENCOUNTER — Encounter: Payer: Self-pay | Admitting: Internal Medicine

## 2019-11-11 ENCOUNTER — Encounter (HOSPITAL_BASED_OUTPATIENT_CLINIC_OR_DEPARTMENT_OTHER): Payer: Medicare Other | Admitting: Physician Assistant

## 2019-11-11 ENCOUNTER — Telehealth: Payer: Self-pay

## 2019-11-11 VITALS — BP 157/68 | HR 74 | Wt 144.0 lb

## 2019-11-11 DIAGNOSIS — E1142 Type 2 diabetes mellitus with diabetic polyneuropathy: Secondary | ICD-10-CM | POA: Diagnosis not present

## 2019-11-11 DIAGNOSIS — L97528 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ity: Secondary | ICD-10-CM | POA: Diagnosis not present

## 2019-11-11 DIAGNOSIS — E1152 Type 2 diabetes mellitus with diabetic peripheral angiopathy with gangrene: Secondary | ICD-10-CM | POA: Diagnosis not present

## 2019-11-11 DIAGNOSIS — I6523 Occlusion and stenosis of bilateral carotid arteries: Secondary | ICD-10-CM | POA: Diagnosis not present

## 2019-11-11 DIAGNOSIS — L97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 limited to breakdown of skin: Secondary | ICD-10-CM | POA: Diagnosis not present

## 2019-11-11 DIAGNOSIS — E11621 Type 2 diabetes mellitus with foot ulcer: Secondary | ICD-10-CM

## 2019-11-11 DIAGNOSIS — E1169 Type 2 diabetes mellitus with other specified complication: Secondary | ICD-10-CM

## 2019-11-11 DIAGNOSIS — L97509 Non-pressure chronic ulcer of other part of unspecified foot with unspecified severity: Secondary | ICD-10-CM | POA: Diagnosis not present

## 2019-11-11 DIAGNOSIS — L97518 Non-pressure chronic ulcer of other part of right foot with other specified severity: Secondary | ICD-10-CM | POA: Diagnosis not present

## 2019-11-11 DIAGNOSIS — I739 Peripheral vascular disease, unspecified: Secondary | ICD-10-CM

## 2019-11-11 DIAGNOSIS — E11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: Secondary | ICD-10-CM

## 2019-11-11 DIAGNOSIS — L97514 Non-pressure chronic ulcer of other part of right foot with necrosis of bone: Secondary | ICD-10-CM | POA: Diagnosis not present

## 2019-11-11 DIAGNOSIS — M869 Osteomyelitis, unspecified: Secondary | ICD-10-CM

## 2019-11-11 LAB — GLUCOSE, CAPILLARY
Glucose-Capillary: 153 mg/dL — ABNORMAL HIGH (ref 70–99)
Glucose-Capillary: 113 mg/dL — ABNORMAL HIGH (ref 70–99)
Glucose-Capillary: 100 mg/dL — ABNORMAL HIGH (ref 70–99)

## 2019-11-11 NOTE — Telephone Encounter (Signed)
Contacted [facility] and spoke with Stanton Kidney to relay verbal orders. Per Dr. Baxter Flattery, extend abx an additional 2 weeks, obtain ESR + CRP labs upon completion and okay to pull PICC after last dose.   Kerri Asche Lorita Officer, RN

## 2019-11-11 NOTE — Progress Notes (Signed)
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 Followed by wound care and dr Dellia Nims. Undergoing HBO therapy- has been doing 5th week of 8 scheduled sessions. She states that she has had the most improvement in healing in the last 4 weeks since starting   Has been on daptomycin and meropenem for the last 5 wks. It ends next Tuesday to complete 6 wks, on the 2/23rd    cx showing serratia and PsA (R ceftaz, S FQ  And S Carbapenem), and enterococcus  Outpatient Encounter Medications as of 11/11/2019  Medication Sig  . acetaminophen (TYLENOL) 500 MG tablet Take 1,000 mg by mouth every 6 (six) hours as needed for moderate pain or headache.  . Ascorbic Acid (VITAMIN C) 1000 MG tablet Take 1,000 mg by mouth daily with supper.   Marland Kitchen aspirin 81 MG tablet Take 81 mg by mouth every evening.   . calcium carbonate (TUMS - DOSED IN MG ELEMENTAL CALCIUM) 500 MG chewable tablet Chew 2 tablets by mouth daily as needed for indigestion or heartburn.  . cefdinir (OMNICEF) 300 MG capsule Take 300 mg by mouth 2 (two) times daily.  Marland Kitchen CINNAMON PO Take 1,000 mg by mouth 3 (three) times daily with meals.   . clopidogrel (PLAVIX) 75 MG tablet Take 1 tablet by mouth once daily  . DAPTOmycin (CUBICIN) 500 MG injection   . diltiazem (CARTIA XT) 240 MG 24 hr capsule Take 1 capsule (240 mg total) by mouth daily.  Marland Kitchen glipiZIDE (GLUCOTROL XL) 5 MG 24 hr tablet Take 1 tablet by mouth once daily with breakfast  . hydrochlorothiazide (HYDRODIURIL) 25 MG tablet Take 1 tablet (25 mg total) by mouth daily.  . INVOKANA 100 MG TABS tablet TAKE 1 TABLET BY MOUTH BEFORE BREAKFAST  . Magnesium 250 MG TABS Take 250 mg by mouth daily with supper.  . meropenem (MERREM) 1 g injection     . metFORMIN (GLUCOPHAGE) 1000 MG tablet Take 1 tablet (1,000 mg total) by mouth 2 (two) times daily.  . metoprolol (LOPRESSOR) 100 MG tablet Take half tab in the morning and a whole tab in the evening. (Patient taking differently: Take 50-100 mg by mouth See admin instructions. Take 50 mg in the morning and 100 mg in the evening)  . OZEMPIC, 0.25 OR 0.5 MG/DOSE, 2 MG/1.5ML SOPN   . pantoprazole (PROTONIX) 40 MG tablet Take 1 tablet (40 mg total) by mouth daily.  Marland Kitchen telmisartan (MICARDIS) 80 MG tablet Take 1 tablet (80 mg total) by mouth daily. (Patient taking differently: Take 40 mg by mouth every evening. )  . vitamin B-12 (CYANOCOBALAMIN) 1000 MCG tablet Take 1,000 mcg by mouth daily with supper.   Facility-Administered Encounter Medications as of 11/11/2019  Medication  . sodium chloride flush (NS) 0.9 % injection 3 mL     Patient Active Problem List   Diagnosis Date Noted  . Gangrene of foot ([place]) 08/07/2019  . Normocytic anemia 08/07/2019  . 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 Critical lower limb ischemia 08/03/2019  . Encounter for immunization 07/09/2016  . Essential hypertension 03/31/2013  . Dyslipidemia 03/31/2013  . DM2 (diabetes mellitus, type 2) ([place]) 03/31/2013  . Overweight (BMI 25.0-29.9) 03/31/2013  . Carotid  artery disease ([place]) 03/31/2013  . PAD (peripheral artery disease) ([place]) 03/31/2013    Health Maintenance Due  Topic Date Due  . OPHTHALMOLOGY EXAM  03/14/1952  . TETANUS/TDAP  03/14/1961  . DEXA SCAN  03/15/2007  . PNA vac Low Risk Adult (1 of 2 - PCV13) 03/15/2007  . FOOT EXAM  05/02/2019    Social History   Tobacco Use  . Smoking status: Former Smoker    Quit date: 03/31/1972    Years since quitting: 47.6  . Smokeless tobacco: Never Used  Substance Use Topics  . Alcohol use: No  . Drug use: No   family history includes Cancer - Colon in her father; Cancer - Prostate in her father; Hyperlipidemia in her mother; Hypertension in  her mother; Melanoma in her brother; Stroke in her mother. Review of Systems  Constitutional: Negative for fever, chills, diaphoresis, activity change, appetite change, fatigue and unexpected weight change.  HENT: Negative for congestion, sore throat, rhinorrhea, sneezing, trouble swallowing and sinus pressure.  Eyes: Negative for photophobia and visual disturbance.  Respiratory: Negative for cough, chest tightness, shortness of breath, wheezing and stridor.  Cardiovascular: Negative for chest pain, palpitations and leg swelling.  Gastrointestinal: Negative for nausea, vomiting, abdominal pain, diarrhea, constipation, blood in stool, abdominal distention and anal bleeding.  Genitourinary: Negative for dysuria, hematuria, flank pain and difficulty urinating.  Musculoskeletal: Negative for myalgias, back pain, joint swelling, arthralgias and gait problem.  Skin: Negative for color change, pallor, rash and wound.  Neurological: Negative for dizziness, tremors, weakness and light-headedness.  Hematological: Negative for adenopathy. Does not bruise/bleed easily.  Psychiatric/Behavioral: Negative for behavioral problems, confusion, sleep disturbance, dysphoric mood, decreased concentration and agitation.    Physical Exam   Wt 144 lb (65.3 kg)   BMI 24.34 kg/m    Right TM  = proximal incision slight opening of 1.5cm Left foot = discoloration/cyanotic of 2nd digit, CBC Lab Results  Component Value Date   WBC 8.9 11/01/2019   RBC 3.93 11/01/2019   HGB 10.9 (L) 11/01/2019   HCT 33.9 (L) 11/01/2019   PLT 365 11/01/2019   MCV 86.3 11/01/2019   MCH 27.7 11/01/2019   MCHC 32.2 11/01/2019   RDW 14.0 11/01/2019   LYMPHSABS 2.2 11/01/2019   MONOABS 0.9 11/01/2019   EOSABS 1.0 (H) 11/01/2019    BMET Lab Results  Component Value Date   NA 140 11/01/2019   K 3.9 11/01/2019   CL 101 11/01/2019   CO2 27 11/01/2019   GLUCOSE 102 (H) 11/01/2019   BUN 28 (H) 11/01/2019   CREATININE 0.72  11/01/2019   CALCIUM 9.9 11/01/2019   GFRNONAA >60 11/01/2019   GFRAA >60 11/01/2019      Assessment and Plan  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 Will do 2 addn weeks. 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  dM = continue with good control of DM

## 2019-11-11 NOTE — Progress Notes (Addendum)
[name], [name] ([id number]) Visit Report for 11/11/2019 HBO Details Patient Name: Date of Service: [name], [name] 11/11/2019 2:00 PM Medical Record Number:[id number] Patient Account Number: [id number] Date of Birth/Sex: Treating RN: 1941/06/07 (79 y.o. [name], [name] Primary Care Almetta Liddicoat: [name] Other Clinician: [name] Referring Kerly Rigsbee: Treating Madelin Weseman/[name]:Stone III, Alen Bleacher, [name] Weeks in Treatment: 17 HBO Treatment Course Details Treatment Course Number: 1 Ordering Lucendia Leard: [name] Total Treatments Ordered: 40 HBO Treatment Start Date: 10/12/2019 HBO Indication: Diabetic Ulcer(s) of the Lower Extremity HBO Treatment Details Treatment Number: 23 Patient Type: Outpatient Chamber Type: Monoplace Chamber Serial #: [id number] Treatment Protocol: 2.5 ATA with 90 minutes oxygen, with two 5 minute air breaks Treatment Details Compression Rate Down: 2.0 psi / minute De-Compression Rate Up: 2.0 psi / minute Air breaks and CompressTx Pressure breathing periods DecompressDecompress Begins Reached (leave unused spaces Begins Ends blank) Chamber Pressure (ATA)1 2.5 2.5 2.5 2.5 2.5 --2.5 1 Clock Time (24 hr) 14:09 14:21 [id number]:96--29:52 16:13 Treatment Length: 124 (minutes) Treatment Segments: 4 Vital Signs Capillary Blood Glucose Reference Range: 80 - 120 mg / dl HBO Diabetic Blood Glucose Intervention Range: <131 mg/dl or >249 mg/dl Time Vitals Blood Respiratory Capillary Blood Glucose Pulse Action Type: Pulse: Temperature: Taken: Pressure: Rate: Glucose (mg/dl): Meter #: Oximetry (%) Taken: Pre 13:55 136/40 74 16 97.3 120 Post 16:15 158/55 77 16 97.9 100 Treatment Response Treatment Toleration: Well Treatment Completion Treatment Completed without Adverse Event Status: Additional Procedure Documentation Tissue Sevierity: Necrosis of bone Physician HBO Attestation: I certify that I supervised this HBO treatment  in accordance with Medicare guidelines. A trained Yes Yes emergency response team is readily available per hospital policies and procedures. Continue HBOT as ordered. Yes Electronic Signature(s) Signed: 11/12/2019 12:37:21 PM By: Worthy Keeler PA-C Previous Signature: 11/11/2019 4:27:12 PM Version By: [name] EMT/HBOT Entered By: Worthy Keeler on 11/12/2019 11:11:38 -------------------------------------------------------------------------------- HBO Safety Checklist Details Patient Name: Date of Service: [name], [name] 11/11/2019 2:00 PM Medical Record Number:[id number] Patient Account Number: [id number] Date of Birth/Sex: Treating RN: 1941/03/18 (79 y.o. [name], [name] Primary Care Fleet Higham: [name] Other Clinician: [name] Referring Ram Haugan: Treating Austen Oyster/[name]:Stone III, Alen Bleacher, [name] Weeks in Treatment: 17 HBO Safety Checklist Items Safety Checklist Consent Form Signed Patient voided / foley secured and emptied When did you last eato 1200 - chicken sammy/boost Last dose of injectable or oral agent insulin NA Ostomy pouch emptied and vented if applicable All implantable devices assessed, documented and approved Intravenous access site secured and place PICC line left arm Valuables secured Linens and cotton and cotton/polyester blend (less than 51% polyester) Personal oil-based products / skin lotions / body lotions removed Wigs or hairpieces removed NA Smoking or tobacco materials removed Books / newspapers / magazines / loose paper removed Cologne, aftershave, perfume and deodorant removed Jewelry removed (may wrap wedding band) Make-up removed Hair care products removed NA Battery operated devices (external) removed NA Heating patches and chemical warmers removed NA Titanium eyewear removed NA Nail polish cured greater than 10 hours NA Casting material cured greater than 10 hours NA Hearing aids removed NA Loose  dentures or partials removed NA Prosthetics have been removed Patient demonstrates correct use of air break device (if applicable) Patient concerns have been addressed Patient grounding bracelet on and cord attached to chamber Specifics for Inpatients (complete in addition to above) Medication sheet sent with patient Intravenous medications needed or due during therapy sent with patient Drainage tubes (e.g. nasogastric tube or chest tube secured  and vented) Endotracheal or Tracheotomy tube secured Cuff deflated of air and inflated with saline Airway suctioned Electronic Signature(s) Signed: 11/11/2019 3:12:32 PM By: [name] EMT/HBOT Entered By: [name] on 11/11/2019 15:12:32

## 2019-11-11 NOTE — Progress Notes (Signed)
RIA, REDCAY ([id number]) Visit Report for 11/11/2019 Arrival Information Details Patient Name: Date of Service: [name], [name] 11/11/2019 2:00 PM Medical Record Number:[id number] Patient Account Number: [id number] Date of Birth/Sex: Treating RN: 1941-09-20 (79 y.o. [name] Primary Care Zonnique Norkus: [name] Other Clinician: [name] Referring Renette Hsu: Treating Zyier Dykema/[name]:Stone III, Alen Bleacher, [name] Weeks in Treatment: 52 Visit Information History Since Last Visit Added or deleted any medications: No Patient Arrived: Walker Any new allergies or adverse reactions: No Arrival Time: 13:50 Had a fall or experienced change in No Accompanied By: self activities of daily living that may affect Transfer Assistance: None risk of falls: Patient Identification Verified: Yes Signs or symptoms of abuse/neglect since last No Secondary Verification Process Yes visito Completed: Hospitalized since last visit: No Patient Requires Transmission- No Implantable device outside of the clinic excluding No Based Precautions: cellular tissue based products placed in the center Patient Has Alerts: Yes since last visit: Patient Alerts: Right ABI .62 TBI Pain Present Now: No refused Left ABI .60 TBI .14 Electronic Signature(s) Signed: 11/11/2019 4:27:12 PM By: [name] EMT/HBOT Entered By: [name] on 11/11/2019 15:11:28 -------------------------------------------------------------------------------- Encounter Discharge Information Details Patient Name: Date of Service: [name] 11/11/2019 2:00 PM Medical Record Number:[id number] Patient Account Number: [id number] Date of Birth/Sex: Treating RN: 1941/06/22 (79 y.o. [name] Primary Care Sarie Stall: [name] Other Clinician: [name] Referring Sanyiah Kanzler: Treating Giovanni Bath/[name]:Stone III, Alen Bleacher, [name] Weeks in Treatment: 51 Encounter Discharge  Information Items Discharge Condition: Stable Ambulatory Status: Walker Discharge Destination: Home Transportation: Private Auto Accompanied By: self Schedule Follow-up Appointment: Yes Clinical Summary of Care: Patient Declined Electronic Signature(s) Signed: 11/11/2019 4:27:12 PM By: [name] EMT/HBOT Entered By: [name] on 11/11/2019 16:26:41 -------------------------------------------------------------------------------- Patient/Caregiver Education Details Patient Name: Date of Service: [name] 2/17/2021andnbsp2:00 PM Medical Record Patient Account Number: [id number] Number: Treating RN: Baruch Gouty Date of Birth/Gender: 1941/08/07 (78 y.o. F) Other Clinician: [name] Primary Care Physician: [name] Treating Worthy Keeler Referring Physician: Physician/[name]: Darl Pikes in Treatment: 46 Education Assessment Education Provided To: Patient Education Topics Provided Hyperbaric Oxygenation: Methods: Explain/Verbal Responses: State content correctly Electronic Signature(s) Signed: 11/11/2019 4:27:12 PM By: [name] EMT/HBOT Entered By: [name] on 11/11/2019 16:26:27 -------------------------------------------------------------------------------- Vitals Details Patient Name: Date of Service: [name] 11/11/2019 2:00 PM Medical Record Number:[id number] Patient Account Number: [id number] Date of Birth/Sex: Treating RN: May 28, 1941 (79 y.o. [name], [name] Primary Care Seanpatrick Maisano: [name] Other Clinician: [name] Referring Yossi Hinchman: Treating Corina Stacy/[name]:Stone III, Alen Bleacher, [name] Weeks in Treatment: 17 Vital Signs Time Taken: 13:55 Temperature (F): 97.3 Height (in): 64 Pulse (bpm): 74 Weight (lbs): 142 Respiratory Rate (breaths/min): 16 Body Mass Index (BMI): 24.4 Blood Pressure (mmHg): 136/40 Capillary Blood Glucose (mg/dl): 120 Reference  Range: 80 - 120 mg / dl Electronic Signature(s) Signed: 11/11/2019 4:27:12 PM By: [name] EMT/HBOT Entered By: [name] on 11/11/2019 15:11:44

## 2019-11-12 ENCOUNTER — Encounter (HOSPITAL_BASED_OUTPATIENT_CLINIC_OR_DEPARTMENT_OTHER): Payer: Medicare Other | Admitting: Internal Medicine

## 2019-11-12 NOTE — Progress Notes (Signed)
[name], [name] ([id number]) Visit Report for 11/11/2019 Problem List Details Patient Name: Date of Service: [name], [name] 11/11/2019 2:00 PM Medical Record Number:[id number] Patient Account Number: [id number] Date of Birth/Sex: Treating RN: 07-Dec-1940 (78 y.o. [name], [name] Primary Care Provider: [name] Other Clinician: Referring Provider: Treating Provider/[name]:Stone III, Alen Bleacher, [name] Weeks in Treatment: 17 Active Problems ICD-10 Evaluated Encounter Code Description Active Date Today Diagnosis E11.621 Type 2 diabetes mellitus with foot ulcer 07/13/2019 No Yes E11.52 Type 2 diabetes mellitus with diabetic peripheral 07/13/2019 No Yes angiopathy with gangrene L97.518 Non-pressure chronic ulcer of other part of right foot 07/13/2019 No Yes with other specified severity L97.528 Non-pressure chronic ulcer of other part of left foot 07/13/2019 No Yes with other specified severity E11.42 Type 2 diabetes mellitus with diabetic polyneuropathy 07/13/2019 No Yes 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, 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 Facility Procedures The patient participates with Medicare or their insurance follows the Medicare Facility Guidelines: CPT4 Code Description Modifier Quantity [id number]-(Facility Use Only) HBOT, full body chamber, 22min 4 Physician Procedures CPT4 Code Description: [id number] - WC PHYS HYPERBARIC OXYGEN THERAPY ICD-10 Diagnosis Description E11.621 Type 2 diabetes mellitus with foot ulcer Modifier: Quantity: 1 Electronic Signature(s) Signed: 11/12/2019 12:37:21 PM By: Worthy Keeler PA-C Previous Signature: 11/11/2019 4:27:12 PM Version By: [name] EMT/HBOT Entered By: Worthy Keeler on 11/12/2019 11:11:48

## 2019-11-13 ENCOUNTER — Other Ambulatory Visit: Payer: Self-pay

## 2019-11-13 ENCOUNTER — Encounter (HOSPITAL_BASED_OUTPATIENT_CLINIC_OR_DEPARTMENT_OTHER): Payer: Medicare Other | Admitting: Internal Medicine

## 2019-11-13 DIAGNOSIS — E1142 Type 2 diabetes mellitus with diabetic polyneuropathy: Secondary | ICD-10-CM | POA: Diagnosis not present

## 2019-11-13 DIAGNOSIS — D649 Anemia, unspecified: Secondary | ICD-10-CM | POA: Diagnosis not present

## 2019-11-13 DIAGNOSIS — Z4781 Encounter for orthopedic aftercare following surgical amputation: Secondary | ICD-10-CM | POA: Diagnosis not present

## 2019-11-13 DIAGNOSIS — L97518 Non-pressure chronic ulcer of other part of right foot with other specified severity: Secondary | ICD-10-CM | POA: Diagnosis not present

## 2019-11-13 DIAGNOSIS — I10 Essential (primary) hypertension: Secondary | ICD-10-CM | POA: Diagnosis not present

## 2019-11-13 DIAGNOSIS — E11621 Type 2 diabetes mellitus with foot ulcer: Secondary | ICD-10-CM | POA: Diagnosis not present

## 2019-11-13 DIAGNOSIS — L97528 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ity: Secondary | ICD-10-CM | POA: Diagnosis not present

## 2019-11-13 DIAGNOSIS — L97514 Non-pressure chronic ulcer of other part of right foot with necrosis of bone: Secondary | ICD-10-CM | POA: Diagnosis not present

## 2019-11-13 DIAGNOSIS — E1152 Type 2 diabetes mellitus with diabetic peripheral angiopathy with gangrene: Secondary | ICD-10-CM | POA: Diagnosis not present

## 2019-11-13 DIAGNOSIS — L97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 limited to breakdown of skin: Secondary | ICD-10-CM | POA: Diagnosis not present

## 2019-11-13 LAB — GLUCOSE, CAPILLARY
Glucose-Capillary: 135 mg/dL — ABNORMAL HIGH (ref 70–99)
Glucose-Capillary: 154 mg/dL — ABNORMAL HIGH (ref 70–99)

## 2019-11-13 NOTE — Progress Notes (Signed)
MADELAINE, WHIPPLE ([id number]) Visit Report for 11/13/2019 Arrival Information Details Patient Name: Date of Service: [name], [name] 11/13/2019 2:00 PM Medical Record Number:[id number] Patient Account Number: [id number] Date of Birth/Sex: Treating RN: May 14, 1941 (79 y.o. [name] Primary Care Ranyah Groeneveld: [name] Other Clinician: [name] Referring Waver Dibiasio: Treating Rodolfo Gaster/[name]:[name], [name], [name] Weeks in Treatment: 9 Visit Information History Since Last Visit Added or deleted any medications: No Patient Arrived: [name]: No Arrival Time: 13:50 Had a fall or experienced change in No Accompanied By: self activities of daily living that may affect Transfer Assistance: None risk of falls: Patient Identification Verified: Yes Signs or symptoms of abuse/neglect since last No Secondary Verification Process Yes visito Completed: Hospitalized since last visit: No Patient Requires Transmission- No Implantable device outside of the clinic excluding No Based Precautions: cellular tissue based products placed in the center Patient Has Alerts: Yes since last visit: Patient Alerts: Right ABI .62 TBI Pain Present Now: No refused Left ABI .60 TBI .14 Electronic Signature(s) Signed: 11/13/2019 5:06:31 PM By: [name] EMT/HBOT Entered By: [name] on 11/13/2019 14:13:15 -------------------------------------------------------------------------------- Encounter Discharge Information Details Patient Name: Date of Service: [name] 11/13/2019 2:00 PM Medical Record Number:[id number] Patient Account Number: [id number] Date of Birth/Sex: Treating RN: 08-25-1941 (79 y.o. [name] Primary Care Tecora Eustache: [name] Other Clinician: [name] Referring Jarrin Staley: Treating Stoney Karczewski/[name]:[name], [name], [name] in Treatment: 17 Encounter  Discharge Information Items Discharge Condition: Stable Ambulatory Status: Walker Discharge Destination: Home Transportation: Private Auto Accompanied By: self Schedule Follow-up Appointment: Yes Clinical Summary of Care: Patient Declined Electronic Signature(s) Signed: 11/13/2019 5:06:31 PM By: [name] EMT/HBOT Entered By: [name] on 11/13/2019 17:05:54 -------------------------------------------------------------------------------- Patient/Caregiver Education Details Patient Name: Date of Service: [name] 2/19/2021andnbsp2:00 PM Medical Record Patient Account Number: [id number] Number: Treating RN: Kela Millin Date of Birth/Gender: [birth year] (79 y.o. F) Other Clinician: [name] Primary Care Physician: Verna Czech Referring Physician: Physician/[name]: Darl Pikes in Treatment: 84 Education Assessment Education Provided To: Patient Education Topics Provided Hyperbaric Oxygenation: Methods: Explain/Verbal Responses: State content correctly Electronic Signature(s) Signed: 11/13/2019 5:06:31 PM By: [name] EMT/HBOT Entered By: [name] on 11/13/2019 17:05:39 -------------------------------------------------------------------------------- Vitals Details Patient Name: Date of Service: [name] 11/13/2019 2:00 PM Medical Record Number:[id number] Patient Account Number: [id number] Date of Birth/Sex: Treating RN: 1941-05-12 (79 y.o. [name] Primary Care Hero Mccathern: [name] Other Clinician: [name] Referring Donnica Jarnagin: Treating Tiari Andringa/[name]:[name], [name], [name] Weeks in Treatment: 17 Vital Signs Time Taken: 13:55 Temperature (F): 97.4 Height (in): 64 Pulse (bpm): 79 Weight (lbs): 142 Respiratory Rate (breaths/min): 19 Body Mass Index (BMI): 24.4 Blood Pressure (mmHg): 145/47 Capillary Blood Glucose (mg/dl):  135 Reference Range: 80 - 120 mg / dl Electronic Signature(s) Signed: 11/13/2019 5:06:31 PM By: [name] EMT/HBOT Entered By: [name] on 11/13/2019 14:13:33

## 2019-11-13 NOTE — Progress Notes (Signed)
VIRLEE, STROSCHEIN ([id number]) Visit Report for 11/13/2019 SuperBill Details Patient Name: Date of Service: [name], [name] 11/13/2019 Medical Record Number:[id number] Patient Account Number: [id number] Date of Birth/Sex: Treating RN: [birth year] (79 y.o. 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 other specified severity L97.528 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ity E11.42 Type 2 diabetes mellitus with diabetic polyneuropathy 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, 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 Facility Procedures The patient participates with Medicare or their insurance follows the Medicare Facility Guidelines CPT4 Code Description Modifier Quantity [id number] G0277-(Facility Use Only) HBOT, full body chamber, 75min 4 Physician Procedures CPT4 Code Description Modifier Quantity [id number] 99357 - WC PHYS HYPERBARIC OXYGEN THERAPY 1 ICD-10 Diagnosis Description E11.621 Type 2 diabetes mellitus with foot ulcer Electronic Signature(s) Signed: 11/13/2019 5:06:31 PM By: [name] EMT/HBOT Signed: 11/13/2019 6:29:59 PM By: [name] MD Entered By: [name] on 11/13/2019 17:05:23

## 2019-11-13 NOTE — Progress Notes (Addendum)
[name], [name] ([id number]) Visit Report for 11/13/2019 HBO Details Patient Name: Date of Service: [name], [name] 11/13/2019 2:00 PM Medical Record Number:[id number] Patient Account Number: [id number] Date of Birth/Sex: Treating RN: 29-Apr-1941 (79 y.o. Hollie Salk, Shannon Primary Care Deyvi Bonanno: [name] Other Clinician: [name] Referring Melo Stauber: Treating Sheehan Stacey/[name]:[name], [name], [name] Weeks in Treatment: 17 HBO Treatment Course Details Treatment Course Number: 1 Ordering Tomeca Helm: [name] Total Treatments Ordered: 40 HBO Treatment Start Date: 10/12/2019 HBO Indication: Diabetic Ulcer(s) of the Lower Extremity HBO Treatment Details Treatment Number: 24 Patient Type: Outpatient Chamber Type: Monoplace Chamber Serial #: [id number] Treatment Protocol: 2.5 ATA with 90 minutes oxygen, with two 5 minute air breaks Treatment Details Compression Rate Down: 2.0 psi / minute De-Compression Rate Up: 2.0 psi / minute Air breaks and CompressTx Pressure breathing periods DecompressDecompress Begins Reached (leave unused spaces Begins Ends blank) Chamber Pressure (ATA)1 2.5 2.5 2.5 2.5 2.5 --2.5 1 Clock Time (24 hr) 14:10 14:22 35:0093:8182:9937:16--96:78 16:14 Treatment Length: 124 (minutes) Treatment Segments: 4 Vital Signs Capillary Blood Glucose Reference Range: 80 - 120 mg / dl HBO Diabetic Blood Glucose Intervention Range: <131 mg/dl or >249 mg/dl Time Vitals Blood Respiratory Capillary Blood Glucose Pulse Action Type: Pulse: Temperature: Taken: Pressure: Rate: Glucose (mg/dl): Meter #: Oximetry (%) Taken: Pre 13:55 145/47 79 19 97.4 135 Post 16:17 151/53 70 17 98.2 154 Treatment Response Treatment Toleration: Well Treatment Completion Treatment Completed without Adverse Event Status: Additional Procedure Documentation Tissue Sevierity: Necrosis of bone Brynne Doane Notes No concerns with treatment given. Patient was also seen  for wound care evaluation Physician HBO Attestation: I certify that I supervised this HBO treatment in accordance with Medicare guidelines. A trained Yes emergency response team is readily available per hospital policies and procedures. Continue HBOT as ordered. Yes Electronic Signature(s) Signed: 11/13/2019 6:29:59 PM By: [name] MD Previous Signature: 11/13/2019 5:06:31 PM Version By: [name] EMT/HBOT Entered By: [name] on 11/13/2019 18:24:01 -------------------------------------------------------------------------------- HBO Safety Checklist Details Patient Name: Date of Service: [name] 11/13/2019 2:00 PM Medical Record Number:[id number] Patient Account Number: [id number] Date of Birth/Sex: Treating RN: Aug 21, 1941 (79 y.o. Hollie Salk, Shannon Primary Care Shelie Lansing: [name] Other Clinician: [name] Referring Patricio Popwell: Treating Caily Rakers/[name]:[name], [name], [name] Weeks in Treatment: 17 HBO Safety Checklist Items Safety Checklist Consent Form Signed Patient voided / foley secured and emptied When did you last eato 1200 - rice soup - fruit Last dose of injectable or oral agent metformin NA Ostomy pouch emptied and vented if applicable All implantable devices assessed, documented and approved Intravenous access site secured and place PICC line left arm Valuables secured Linens and cotton and cotton/polyester blend (less than 51% polyester) Personal oil-based products / skin lotions / body lotions removed Wigs or hairpieces removed NA Smoking or tobacco materials removed Books / newspapers / magazines / loose paper removed Cologne, aftershave, perfume and deodorant removed Jewelry removed (may wrap wedding band) Make-up removed Hair care products removed NA Battery operated devices (external) removed NA Heating patches and chemical warmers removed NA Titanium eyewear removed Nail polish cured greater than 10  hours NA Casting material cured greater than 10 hours NA Hearing aids removed NA Loose dentures or partials removed NA Prosthetics have been removed Patient demonstrates correct use of air break device (if applicable) Patient concerns have been addressed Patient grounding bracelet on and cord attached to chamber Specifics for Inpatients (complete in addition to above) Medication sheet sent with patient Intravenous medications needed or due during therapy sent  with patient Drainage tubes (e.g. nasogastric tube or chest tube secured and vented) Endotracheal or Tracheotomy tube secured Cuff deflated of air and inflated with saline Airway suctioned Electronic Signature(s) Signed: 11/13/2019 2:14:17 PM By: [name] EMT/HBOT Entered By: [name] on 11/13/2019 14:14:16

## 2019-11-16 ENCOUNTER — Other Ambulatory Visit: Payer: Self-pay

## 2019-11-16 ENCOUNTER — Encounter (HOSPITAL_BASED_OUTPATIENT_CLINIC_OR_DEPARTMENT_OTHER): Payer: Medicare Other | Admitting: Internal Medicine

## 2019-11-16 DIAGNOSIS — Z9181 History of falling: Secondary | ICD-10-CM | POA: Diagnosis not present

## 2019-11-16 DIAGNOSIS — M869 Osteomyelitis, unspecified: Secondary | ICD-10-CM | POA: Diagnosis not present

## 2019-11-16 DIAGNOSIS — Z792 Long term (current) use of antibiotics: Secondary | ICD-10-CM | POA: Diagnosis not present

## 2019-11-16 DIAGNOSIS — Z7902 Long term (current) use of antithrombotics/antiplatelets: Secondary | ICD-10-CM | POA: Diagnosis not present

## 2019-11-16 DIAGNOSIS — Z452 Encounter for adjustment and management of vascular access device: Secondary | ICD-10-CM | POA: Diagnosis not present

## 2019-11-16 DIAGNOSIS — K219 Gastro-esophageal reflux disease without esophagitis: Secondary | ICD-10-CM | POA: Diagnosis not present

## 2019-11-16 DIAGNOSIS — Z4800 Encounter for change or removal of nonsurgical wound dressing: Secondary | ICD-10-CM | POA: Diagnosis not present

## 2019-11-16 DIAGNOSIS — Z7982 Long term (current) use of aspirin: Secondary | ICD-10-CM | POA: Diagnosis not present

## 2019-11-16 DIAGNOSIS — I10 Essential (primary) hypertension: Secondary | ICD-10-CM | POA: Diagnosis not present

## 2019-11-16 DIAGNOSIS — M1990 Unspecified osteoarthritis, unspecified site: Secondary | ICD-10-CM | POA: Diagnosis not present

## 2019-11-16 DIAGNOSIS — Z4801 Encounter for change or removal of surgical wound dressing: Secondary | ICD-10-CM | POA: Diagnosis not present

## 2019-11-16 DIAGNOSIS — L97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 limited to breakdown of skin: Secondary | ICD-10-CM | POA: Diagnosis not present

## 2019-11-16 DIAGNOSIS — L97528 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ity: Secondary | ICD-10-CM | POA: Diagnosis not present

## 2019-11-16 DIAGNOSIS — E1152 Type 2 diabetes mellitus with diabetic peripheral angiopathy with gangrene: Secondary | ICD-10-CM | POA: Diagnosis not present

## 2019-11-16 DIAGNOSIS — E11621 Type 2 diabetes mellitus with foot ulcer: Secondary | ICD-10-CM | POA: Diagnosis not present

## 2019-11-16 DIAGNOSIS — Z7984 Long term (current) use of oral hypoglycemic drugs: Secondary | ICD-10-CM | POA: Diagnosis not present

## 2019-11-16 DIAGNOSIS — Z89431 Acquired absence of right foot: Secondary | ICD-10-CM | POA: Diagnosis not present

## 2019-11-16 DIAGNOSIS — E785 Hyperlipidemia, unspecified: Secondary | ICD-10-CM | POA: Diagnosis not present

## 2019-11-16 DIAGNOSIS — Z79899 Other long term (current) drug therapy: Secondary | ICD-10-CM | POA: Diagnosis not present

## 2019-11-16 DIAGNOSIS — Z85828 Personal history of other malignant neoplasm of skin: Secondary | ICD-10-CM | POA: Diagnosis not present

## 2019-11-16 DIAGNOSIS — Z4781 Encounter for orthopedic aftercare following surgical amputation: Secondary | ICD-10-CM | POA: Diagnosis not present

## 2019-11-16 DIAGNOSIS — D649 Anemia, unspecified: Secondary | ICD-10-CM | POA: Diagnosis not present

## 2019-11-16 DIAGNOSIS — E1142 Type 2 diabetes mellitus with diabetic polyneuropathy: Secondary | ICD-10-CM | POA: Diagnosis not present

## 2019-11-16 DIAGNOSIS — L97518 Non-pressure chronic ulcer of other part of right foot with other specified severity: Secondary | ICD-10-CM | POA: Diagnosis not present

## 2019-11-16 DIAGNOSIS — L97514 Non-pressure chronic ulcer of other part of right foot with necrosis of bone: Secondary | ICD-10-CM | POA: Diagnosis not present

## 2019-11-16 LAB — GLUCOSE, CAPILLARY
Glucose-Capillary: 141 mg/dL — ABNORMAL HIGH (ref 70–99)
Glucose-Capillary: 102 mg/dL — ABNORMAL HIGH (ref 70–99)

## 2019-11-16 NOTE — Progress Notes (Signed)
[name] ([id number]) Visit Report for 11/16/2019 Arrival Information Details Patient Name: Date of Service: [name], [name] 11/16/2019 2:00 PM Medical Record Number:[id number] Patient Account Number: [id number] Date of Birth/Sex: Treating RN: Aug 17, 1941 (79 y.o. [name] Primary Care Safaa Stingley: [name] Other Clinician: [name] Referring Layli Capshaw: Treating Alick Lecomte/[name]:[name], [name], [name] Visit Information History Since Last Visit Added or deleted any medications: No Patient Arrived: [name]: No Arrival Time: 13:45 Had a fall or experienced change in No Accompanied By: self activities of daily living that may affect Transfer Assistance: None risk of falls: Patient Identification Verified: Yes Signs or symptoms of abuse/neglect since last No Secondary Verification Process Yes visito Completed: Hospitalized since last visit: No Patient Requires Transmission- No Implantable device outside of the clinic excluding No Based Precautions: cellular tissue based products placed in the center Patient Has Alerts: Yes since last visit: Patient Alerts: Right ABI .62 TBI Pain Present Now: No refused Left ABI .60 TBI .14 Electronic Signature(s) Signed: 11/16/2019 4:33:06 PM By: [name] EMT/HBOT Entered By: [name] on 11/16/2019 14:12:27 -------------------------------------------------------------------------------- Encounter Discharge Information Details Patient Name: Date of Service: [name] 11/16/2019 2:00 PM Medical Record Number:[id number] Patient Account Number: [id number] Date of Birth/Sex: Treating RN: April 02, 1941 (79 y.o. [name] Primary Care Korrine Sicard: [name] Other Clinician: [name] Referring Keyion Knack: Treating Verle Brillhart/[name]:[name], [name], [name] in Treatment: 20 Encounter Discharge  Information Items Discharge Condition: Stable Ambulatory Status: Walker Discharge Destination: Home Transportation: Private Auto Accompanied By: self Schedule Follow-up Appointment: Yes Clinical Summary of Care: Patient Declined Electronic Signature(s) Signed: 11/16/2019 4:33:06 PM By: [name] EMT/HBOT Entered By: [name] on 11/16/2019 16:32:08 -------------------------------------------------------------------------------- Patient/Caregiver Education Details Patient Name: Date of Service: [name], [name] 2/22/2021andnbsp2:00 PM Medical Record Patient Account Number: [id number] Number: Treating RN: [name] Date of Birth/Gender: [birth year] (78 y.o. F) Other Clinician: [name] Primary Care Physician: Verna Czech Referring Physician: Physician/[name]: Darl Pikes in Treatment: 27 Education Assessment Education Provided To: Patient Education Topics Provided Hyperbaric Oxygenation: Methods: Explain/Verbal Responses: State content correctly Electronic Signature(s) Signed: 11/16/2019 4:33:06 PM By: [name] EMT/HBOT Entered By: [name] on 11/16/2019 16:31:31 -------------------------------------------------------------------------------- Vitals Details Patient Name: Date of Service: [name] 11/16/2019 2:00 PM Medical Record Number:[id number] Patient Account Number: [id number] Date of Birth/Sex: Treating RN: Feb 09, 1941 (79 y.o. [name] Primary Care Larrisa Cravey: [name] Other Clinician: [name] Referring Carigan Lister: Treating Adreanne Yono/[name]:[name], [name], [name] Vital Signs Time Taken: 13:50 Temperature (F): 98.1 Height (in): 64 Pulse (bpm): 73 Weight (lbs): 142 Respiratory Rate (breaths/min): 15 Body Mass Index (BMI): 24.4 Blood Pressure (mmHg): 147/59 Capillary Blood Glucose (mg/dl): 141 Reference  Range: 80 - 120 mg / dl Electronic Signature(s) Signed: 11/16/2019 4:33:06 PM By: [name] EMT/HBOT Entered By: [name] on 11/16/2019 14:12:43

## 2019-11-16 NOTE — Progress Notes (Signed)
[name] ([id number]) Visit Report for 11/13/2019 HPI Details Patient Name: Date of Service: [name], [name] 11/13/2019 4:00 PM Medical Record Number:[id number] Patient Account Number: [id number] Date of Birth/Sex: Treating RN: 26-Sep-1940 (79 y.o. [name] Primary Care Provider: [name] Other Clinician: Referring Provider: Treating Provider/[name]:Ermie Glendenning, [name], [name] Weeks in Treatment: 17 History of Present Illness HPI Description: ADMISSION 07/13/2019 Patient is a 79 year old type II diabetic. She has known PAD. She has been followed by Dr. Jacqualyn Posey of podiatry for blistering areas on her toes dating back to 04/30/2019 which at this was the left fourth toe. By 8/11 she had wounds on the right and left second toes. She underwent arterial studies by Dr. Alvester Chou on 7/31 that showed ABIs on the right of 0.60 TBI of 0.26 on the left ABI of 0.56 and a TBI of 0.25. By 9/10 she had ischemic-looking wounds per Dr. Earleen Newport on the right first, left first second and third. She has been using Medihoney and then mupirocin more recently simply Betadine. The patient underwent angiography by Dr. Gwenlyn Found on 9/21. 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 Noted that there was 1 vessel runoff in the foot via the peroneal. On the left there was an 80% left SFA, 70% mid left SFA. There was a short segment calcified CTO distal less than SFA and above-knee popliteal artery reconstituting with two-vessel runoff. 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 It was felt that she had bilateral SFA disease as well as tibial vessel disease. An attempt was made to revascularize the left SFA but they were unable to cross because of the highly calcified nature of the lesion. 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 She has an area on the medial left fourth toe with raised horned callus on top of this. I am not certain what this represents. With regards to pain she has about a 2-1/2 I will claudication tolerance in the grocery store. She has some pain in night which is relieved by putting her feet down over the side of the bed. She is wearing Nitro-Dur patches on the top of her right foot. Past medical history includes type 2 diabetes with secondary PAD, neoplasm of the skin, diabetic retinopathy, carotid artery stenosis, hyperlipidemia hypertension. 11/2; the last time the patient was here I spoke to Dr. Gwenlyn Found about revascularization options on the right. As I understand things currently Dr. Gwenlyn Found spoke with Dr. Trula Slade and ultimately the patient was taken to surgery on 10/27. She had a right iliofemoral endarterectomy with a bovine patch angioplasty. I think the plan now is for her to have a staged intervention on the right SFA by Dr. Alvester Chou. Per the patient's understanding Dr. Gwenlyn Found and Dr. Jacqualyn Posey are waiting to see when the gangrenous toes on the right foot can be amputated. The patient states her pain is a lot better and she is grateful for that. 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 Small area on the left fourth toe. She is using Betadine to the mummified areas on the right and Medihoney on the left 08/27/2019; the last time I saw this patient I discharged her from the clinic. She had been revascularized by Dr. Trula Slade and she had  a right iliofemoral endarterectomy with a bovine angioplasty. 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 Jacqualyn Posey of podiatry on 08/07/2019. I note that she also had a intervention by Dr. Gwenlyn Found and he performed a directional atherectomy and drug-coated balloon angioplasty of the SFA and popliteal artery on the right. I  am not certain of the date of Dr. Kennon Holter procedure as of the time of this dictation. She was referred back to [place] by Dr. Earleen Newport predominantly for follow-up of the left fourth toe. She still has sutures and stitches in the right TMA site. She states her pain is a lot better. She expresses concern about the condition of the amputation site at the TMA. She is on doxycycline I think prescribed by Dr. Earleen Newport. She is complaining of some pain at night 12/10; I spoke to Dr. Jacqualyn Posey last week. He removed the sutures on the right foot on Monday of this week. She has the area on the left fourth toe just proximal to the PIP and then the right TMA site. She is still on doxycycline and has enough through next week. Unfortunately the TMA site does not look good at all. Both on the lateral and medial part of the incisions are areas that probe to bone. There is purulence over the medial part which I have cultured. We will use silver alginate. Left fourth toe looks somewhat better but there was still exposed bone 12/17; patient has an MRI booked for 12/30. Culture I did last week showed Pseudomonas Serratia and Enterococcus. This was purulent drainage coming out of the medial part of her amputation site. I use cefdinir 300 twice daily for 10 days that started on 12/15. Her x-ray on the right showed limited evaluation for osteomyelitis. The findings could have been postoperative. There was subtle erosion in the distal first and distal fifth metatarsal. An MRI was suggested. On the left she had irregularity of the fourth middle and proximal phalanx consistent with a history of osteomyelitis. I do not know that she has a history of osteomyelitis in this area. She had a newly defined area on the plantar third toe 12/31; patient's MRI is listed below: MRI OF THE RIGHT FOREFOOT WITHOUT AND WITH CONTRAST TECHNIQUE: Multiplanar, multisequence MR imaging of the right forefoot was performed before and after the  administration of intravenous contrast. CONTRAST: 6 mL GADAVIST IV SOLN COMPARISON: Plain films right foot 09/04/2019. FINDINGS: Bones/Joint/Cartilage The patient is status post transmetatarsal amputation as seen on the prior exam. Marrow edema and enhancement are seen in all of the distal metatarsals. In the first metatarsal, signal change extends 1.5 cm proximal to the stump and in the second metatarsal extends approximately 2 cm proximal to the stump. Edema and enhancement are seen in only the distal 0.7 cm of the third metatarsal stump and tips of the fourth and fifth metatarsals. Bone marrow signal is otherwise unremarkable. A small focus of subchondral edema is seen in the lateral talus, likely degenerative. Ligaments Intact. Muscles and Tendons No intramuscular fluid collection. Soft tissues Skin ulceration is seen off the stump of the first metatarsal. A thin fluid collection tracks deep to the wound and over the anterior metatarsals worrisome for small abscess. Intense subcutaneous edema and enhancement are seen diffusely. 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 Cellulitis about the foot. Skin ulceration over the distal first metatarsal is  identified with a thin fluid collection tracking anteriorly along the stump worrisome for abscess. Small focus of subchondral edema in the lateral talus is likely degenerative. Electronically Signed By: Inge Rise M.D. On: 09/23/2019 15:25 Patient arrives in clinic today not complaining any of any pain. 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 She does not describe claudication however her activity is very limited. 10/01/2019; since the patient was last here I have communicated with Dr. Gwenlyn Found after bypass by  Dr. Trula Slade and addressing the right superficial femoral artery he states that she is widely patent through peroneal arteries to the ankle with collaterals to the dorsalis pedis. He states he is going to talk to colleagues about the feasibility of tibial pedal access. The patient seems infectious disease later this afternoon Dr. Baxter Flattery. in preparation for this she has been off antibiotics for 1 week and I went ahead and obtain pieces of the remanence of her first metatarsal for pathology and CandS. The patient is a candidate for hyperbaric oxygen. 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 1/15; considerable improvement in the wounds on her feet. We are using silver collagen. She follows with Dr. Baxter Flattery of infectious disease and is on meropenem and daptomycin. She has been taught how to do this herself at home. Follows with Dr. Graylon Good at the end of treatment here. She has 2 wounds on the surgical TMA site 1 lateral and 1 medial the lateral 1 has regressed tremendously. The area medially still has some exposed bone although the base of this looks healthy. 1/22; 2 separate wounds on the surgical TMA site. Both of these look satisfactory. The medial area does not have any exposed bone. This is an improvement On the left side fourth toe dorsally over the proximal phalanx there is a deep punched out area that probes to bone. She has an area on the tip of the left third toe. She also tells [place] that in HBO she traumatized her left Achilles and this is left her with a superficial wound area 1/28; weekly visit along with HBO. She has 5 wounds. To our punched out areas on the original TMA site on the right. Both of these appear to have contracted. The area on the right no longer has exposed bone. She has an area on the tip of the left third toe and on the DIP of the left fourth toe. Both of these had surface callus that I removed and unfortunately they have  small areas that both go to bone. She has a traumatic wound on the left Achilles area that happened in HBO and that appears better. She is tolerating her IV antibiotics well at home. She has home health changing the dressings and she is doing it once on the weekends. We have been using silver collagen. She has been extensively revascularized on the right by Dr. Trula Slade and subsequently by Dr. Gwenlyn Found. 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 Baxter Flattery of infectious disease on 2/17. Dr. Gwenlyn Found on 2/23 and according to her her antibiotics stopped on 2/23. She is tolerating hyperbarics well. She has made a tremendous improvement in the right forefoot with only 2 smaller open wounds. Using silver collagen. On the left foot she has the area on the  tip of the left third toe and the medial part of the left fourth toe. These had exposed bone last week I did not sense any of that today 2/12; sees Dr. Graylon Good on 2/17 and Dr. Gwenlyn Found on 2/23. We are going to use Dermagraft on this today however the lateral part of her train TMA incision on the right is healed and the medial part is down so much that we just continue with the silver collagen She has wounds on the tip of her left third and the medial aspect of the left fourth. Both of these still have exposed bone I have not been able to get these to epithelialize. 2/19; she sees Dr. Gwenlyn Found on 2/23 and I have communicated with him about the left vascular supply. Looking at her angiogram from 08/03/2019 it looks as though that they could not cross the left CFA. Noted that she had one-vessel runoff bilaterally. She also apparently saw Dr. Graylon Good although I did not look up these results for preparation of this record. Electronic Signature(s) Signed: 11/13/2019 6:29:59 PM By: [name] MD Entered By: [name] on 11/13/2019  18:04:56 -------------------------------------------------------------------------------- Physical Exam Details Patient Name: Date of Service: [name] 11/13/2019 4:00 PM Medical Record Number:[id number] Patient Account Number: [id number] Date of Birth/Sex: Treating RN: [birth year] (79 y.o. [name] Primary Care Provider: [name] Other Clinician: Referring Provider: Treating Provider/[name]:Alois Mincer, [name], [name] Weeks in Treatment: 71 Constitutional Patient is hypertensive.. Pulse regular and within target range for patient.Marland Kitchen Respirations regular, non-labored and within target range.. Temperature is normal and within the target range for the patient.Marland Kitchen Appears in no distress. Notes Wound exam; on the right TMA she continues to make excellent improvement. Lateral part of this originally large gaping wound is closed and the medial part continues to make good improvement. Much smaller than last week's review The area over the tip of her third toe no longer has exposed bone nor the area on the medial fourth toe on the left. That the bone was not identifiable after last week's debridement Electronic Signature(s) Signed: 11/13/2019 6:29:59 PM By: [name] MD Entered By: [name] on 11/13/2019 18:06:15 -------------------------------------------------------------------------------- Physician Orders Details Patient Name: Date of Service: [name] 11/13/2019 4:00 PM Medical Record Number:[id number] Patient Account Number: [id number] Date of Birth/Sex: Treating RN: 02-01-1941 (79 y.o. 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 - after HBO Dressing Change Frequency Change Dressing every other day. - home health to change twice a week. wound center weekly. Wound Cleansing Clean wound with Wound Cleanser Primary Wound Dressing Wound #1 Left,Dorsal Toe Fourth Silver Collagen - moisten with hydrogel. Wound #7 Right Amputation Site - Transmetatarsal Silver Collagen - moisten with hydrogel. Wound #8 Left Toe Third Silver Collagen - moisten with hydrogel Secondary Dressing Wound #1 Left,Dorsal Toe Fourth Kerlix/Rolled Gauze Wound #7 Right Amputation Site - Transmetatarsal Kerlix/Rolled  Gauze ABD pad Wound #8 Left Toe Third Kerlix/Rolled Gauze Dry Gauze Edema Control Avoid standing for long periods of time Elevate legs to the level of the heart or above for 30 minutes daily and/or when sitting, a frequency of: - throughout the day. Off-Loading DH Walker Boot to: - right foot Open toe surgical shoe to: - felt in surgical shoe for 3rd toe. wound center to provide patient with shoe today. [place] skilled nursing for wound care. - [facility] twice a week. Electronic Signature(s) Signed: 11/13/2019 6:29:59 PM By: [name] MD Signed: 11/16/2019 1:40:10 PM By: Kela Millin Entered By: Kela Millin on 11/13/2019 17:12:29 -------------------------------------------------------------------------------- Problem List Details Patient Name: Date of Service: [name]  11/13/2019 4:00 PM Medical Record Number:[id number] Patient Account Number: [id number] Date of Birth/Sex: Treating RN: 03/04/1941 (79 y.o. [name] Primary Care Provider: [name] Other Clinician: Referring Provider: Treating Provider/[name]:Chetara Kropp, [name], [name] Weeks in Treatment: 17 Active Problems ICD-10 Evaluated Encounter Code Description Active Date Today Diagnosis E11.621 Type 2 diabetes mellitus with foot ulcer 07/13/2019 No Yes E11.52 Type 2 diabetes mellitus with diabetic peripheral 07/13/2019 No Yes angiopathy with gangrene L97.518 Non-pressure chronic ulcer of other part of right foot 07/13/2019 No Yes with other specified severity L97.528 Non-pressure chronic ulcer of other part of left foot 07/13/2019 No Yes with other specified severity E11.42 Type 2 diabetes mellitus with diabetic polyneuropathy 07/13/2019 No Yes 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 [name] Primary Care Provider: [name] Other Clinician: Referring Provider: Treating Provider/[name]:Anupama Piehl, [name], [name] Weeks in Treatment: 17 Subjective History of Present Illness (HPI) ADMISSION 07/13/2019 Patient is a 79 year old type II diabetic. She has known PAD. She has been followed by Dr. Jacqualyn Posey of podiatry for blistering areas on her  toes dating back to 04/30/2019 which at this was the left fourth toe. By 8/11 she had wounds on the right and left second toes. She underwent arterial studies by Dr. Alvester Chou on 7/31 that showed ABIs on the right of 0.60 TBI of 0.26 on the left ABI of 0.56 and a TBI of 0.25. By 9/10 she had ischemic-looking wounds per Dr. Earleen Newport on the right first, left first second and third. She has been using Medihoney and then mupirocin more recently simply Betadine. The patient underwent angiography by Dr. Gwenlyn Found on 9/21. 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 Noted that there was 1 vessel runoff in the foot via the peroneal. ooOn the left there was an 80% left SFA, 70% mid left SFA. There was a short segment calcified CTO distal less than SFA and above-knee popliteal artery reconstituting with two-vessel runoff. 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 It was felt that she had bilateral SFA disease as well as tibial vessel disease. An attempt was made to revascularize the left SFA but they were unable to cross because of the highly calcified nature of the lesion. The patient has ischemic dry  gangrene at the tips of the right first right second right third toes with small ischemic spots on the dorsal right fourth and right fifth. She has an area on the medial left fourth toe with raised horned callus on top of this. I am not certain what this represents. With regards to pain she has about a 2-1/2 I will claudication tolerance in the grocery store. She has some pain in night which is relieved by putting her feet down over the side of the bed. She is wearing Nitro-Dur patches on the top of her right foot. Past medical history includes type 2 diabetes with secondary PAD, neoplasm of the skin, diabetic retinopathy, carotid artery stenosis, hyperlipidemia hypertension. 11/2; the last time the  patient was here I spoke to Dr. Gwenlyn Found about revascularization options on the right. As I understand things currently Dr. Gwenlyn Found spoke with Dr. Trula Slade and ultimately the patient was taken to surgery on 10/27. She had a right iliofemoral endarterectomy with a bovine patch angioplasty. I think the plan now is for her to have a staged intervention on the right SFA by Dr. Alvester Chou. Per the patient's understanding Dr. Gwenlyn Found and Dr. Jacqualyn Posey are waiting to see when the gangrenous toes on the right foot can be amputated. The patient states her pain is a lot better and she is grateful for that. 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 Small area on the left fourth toe. She is using Betadine to the mummified areas on the right and Medihoney on the left 08/27/2019; the last time I saw this patient I discharged her from the clinic. She had been revascularized by Dr. Trula Slade and she had a right iliofemoral endarterectomy with a bovine angioplasty. 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 Jacqualyn Posey of podiatry on 08/07/2019. I note that she also had a intervention by Dr. Gwenlyn Found and he performed a directional atherectomy and drug-coated balloon angioplasty of the SFA and popliteal artery on the right. I am not certain of the date of Dr. Kennon Holter procedure as of the time of this dictation. She was referred back to [place] by Dr. Earleen Newport predominantly for follow-up of the left fourth toe. She still has sutures and stitches in the right TMA site. She states her pain is a lot better. She expresses concern about the condition of the amputation site at the TMA. She is on doxycycline I think prescribed by Dr. Earleen Newport. She is complaining of some pain at night 12/10; I spoke to Dr. Jacqualyn Posey last week. He removed the sutures on the right foot on Monday of this week. She has the area on the left fourth toe just proximal to the PIP and then the right TMA  site. She is still on doxycycline and has enough through next week. Unfortunately the TMA site does not look good at all. Both on the lateral and medial part of the incisions are areas that probe to bone. There is purulence over the medial part which I have cultured. We will use silver alginate. Left fourth toe looks somewhat better but there was still exposed bone 12/17; patient has an MRI booked for 12/30. Culture I did last week showed Pseudomonas Serratia and Enterococcus. This was purulent drainage coming out of the medial part of her amputation site. I use cefdinir 300 twice daily for 10 days that started on 12/15. Her x-ray on the right showed limited evaluation for osteomyelitis. The findings could  have been postoperative. There was subtle erosion in the distal first and distal fifth metatarsal. An MRI was suggested. On the left she had irregularity of the fourth middle and proximal phalanx consistent with a history of osteomyelitis. I do not know that she has a history of osteomyelitis in this area. She had a newly defined area on the plantar third toe 12/31; patient's MRI is listed below: MRI OF THE RIGHT FOREFOOT WITHOUT AND WITH CONTRAST TECHNIQUE: Multiplanar, multisequence MR imaging of the right forefoot was performed before and after the administration of intravenous contrast. CONTRAST: 6 mL GADAVIST IV SOLN COMPARISON: Plain films right foot 09/04/2019. FINDINGS: Bones/Joint/Cartilage The patient is status post transmetatarsal amputation as seen on the prior exam. Marrow edema and enhancement are seen in all of the distal metatarsals. In the first metatarsal, signal change extends 1.5 cm proximal to the stump and in the second metatarsal extends approximately 2 cm proximal to the stump. Edema and enhancement are seen in only the distal 0.7 cm of the third metatarsal stump and tips of the fourth and fifth metatarsals. Bone marrow signal is otherwise unremarkable. A small  focus of subchondral edema is seen in the lateral talus, likely degenerative. Ligaments Intact. Muscles and Tendons No intramuscular fluid collection. Soft tissues Skin ulceration is seen off the stump of the first metatarsal. A thin fluid collection tracks deep to the wound and over the anterior metatarsals worrisome for small abscess. Intense subcutaneous edema and enhancement are seen diffusely. 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 Cellulitis about the foot. Skin ulceration over the distal first metatarsal is identified with a thin fluid collection tracking anteriorly along the stump worrisome for abscess. Small focus of subchondral edema in the lateral talus is likely degenerative. Electronically Signed By: Inge Rise M.D. On: 09/23/2019 15:25 Patient arrives in clinic today not complaining any of any pain. 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 She does not describe claudication however her activity is very limited. 10/01/2019; since the patient was last here I have communicated with Dr. Gwenlyn Found after bypass by Dr. Trula Slade and addressing the right superficial femoral artery he states that she is widely patent through peroneal arteries to the ankle with collaterals to the dorsalis pedis. He states he is going to talk to colleagues about the feasibility of tibial pedal access. The patient seems infectious disease later this afternoon Dr. Baxter Flattery. in preparation for this she has been off antibiotics for 1 week and I went ahead and obtain pieces of the remanence of her first metatarsal for pathology and CandS. The patient is a candidate for hyperbaric oxygen. 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 1/15;  considerable improvement in the wounds on her feet. We are using silver collagen. She follows with Dr. Baxter Flattery of infectious disease and is on meropenem and daptomycin. She has been taught how to do this herself at home. Follows with Dr. Graylon Good at the end of treatment here. She has 2 wounds on the surgical TMA site 1 lateral and 1 medial the lateral 1 has regressed tremendously. The area medially still has some exposed bone although the base of this looks healthy. 1/22; 2 separate wounds on the surgical TMA site. Both of these look satisfactory. The medial area does not have any exposed bone. This is  an improvement On the left side fourth toe dorsally over the proximal phalanx there is a deep punched out area that probes to bone. She has an area on the tip of the left third toe. She also tells [place] that in HBO she traumatized her left Achilles and this is left her with a superficial wound area 1/28; weekly visit along with HBO. She has 5 wounds. To our punched out areas on the original TMA site on the right. Both of these appear to have contracted. The area on the right no longer has exposed bone. She has an area on the tip of the left third toe and on the DIP of the left fourth toe. Both of these had surface callus that I removed and unfortunately they have small areas that both go to bone. She has a traumatic wound on the left Achilles area that happened in HBO and that appears better. She is tolerating her IV antibiotics well at home. She has home health changing the dressings and she is doing it once on the weekends. We have been using silver collagen. She has been extensively revascularized on the right by Dr. Trula Slade and subsequently by Dr. Gwenlyn Found. 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 Baxter Flattery of infectious disease on 2/17. Dr. Gwenlyn Found on 2/23 and according to  her her antibiotics stopped on 2/23. She is tolerating hyperbarics well. She has made a tremendous improvement in the right forefoot with only 2 smaller open wounds. Using silver collagen. On the left foot she has the area on the tip of the left third toe and the medial part of the left fourth toe. These had exposed bone last week I did not sense any of that today 2/12; sees Dr. Graylon Good on 2/17 and Dr. Gwenlyn Found on 2/23. We are going to use Dermagraft on this today however the lateral part of her train TMA incision on the right is healed and the medial part is down so much that we just continue with the silver collagen She has wounds on the tip of her left third and the medial aspect of the left fourth. Both of these still have exposed bone I have not been able to get these to epithelialize. 2/19; she sees Dr. Gwenlyn Found on 2/23 and I have communicated with him about the left vascular supply. Looking at her angiogram from 08/03/2019 it looks as though that they could not cross the left CFA. Noted that she had one-vessel runoff bilaterally. She also apparently saw Dr. Graylon Good although I did not look up these results for preparation of this record. Objective Constitutional Patient is hypertensive.. Pulse regular and within target range for patient.Marland Kitchen Respirations regular, non-labored and within target range.. Temperature is normal and within the target range for the patient.Marland Kitchen Appears in no distress. Vitals Time Taken: 4:30 PM, Height: 64 in, Weight: 142 lbs, BMI: 24.4, Temperature: 97.4 F, Pulse: 79 bpm, Respiratory Rate: 18 breaths/min, Blood Pressure: 145/47 mmHg, Capillary Blood Glucose: 135 mg/dl. General Notes: Wound exam; on the right TMA she continues to make excellent improvement. Lateral part of this originally large gaping wound is closed and the medial part continues to make good improvement. Much smaller than last week's review ooThe area over the tip of her third toe no longer has exposed bone  nor the area on the medial fourth toe on the left.  That the bone was not identifiable after last week's debridement Integumentary (Hair, Skin) Wound #1 status is Open. Original cause of wound was Blister. The wound is located on the Left,Dorsal Toe Fourth. The wound measures 0.3cm length x 0.3cm width x 0.3cm depth; 0.071cm^2 area and 0.021cm^3 volume. There is bone and Fat Layer (Subcutaneous Tissue) Exposed exposed. There is no tunneling or undermining noted. There is a small amount of serosanguineous drainage noted. The wound margin is distinct with the outline attached to the wound base. There is small (1-33%) pink, pale granulation within the wound bed. There is a large (67-100%) amount of necrotic tissue within the wound bed including Adherent Slough. Wound #7 status is Open. Original cause of wound was Surgical Injury. 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 The wound measures 0.5cm length x 1.5cm width x 0.2cm depth; 0.589cm^2 area and 0.118cm^3 volume. There is Fat Layer (Subcutaneous Tissue) Exposed exposed. There is no tunneling or undermining noted. There is a medium amount of serosanguineous drainage noted. The wound margin is well defined and not attached to the wound base. There is medium (34-66%) pink granulation within the wound bed. There is a medium (34-66%) amount of necrotic tissue within the wound bed including Adherent Slough. Wound #8 status is Open. Original cause of wound was Gradually Appeared. The wound is located on the Left Toe Third. The wound measures 0.2cm length x 0.4cm width x 0.3cm depth; 0.063cm^2 area and 0.019cm^3 volume. There is Fat Layer (Subcutaneous Tissue) Exposed exposed. There is no tunneling or undermining noted. There is a small amount of serosanguineous drainage noted. The wound margin is distinct with the outline attached to the wound base. There is small (1-33%) pink, pale granulation within the wound bed. There is a  large (67-100%) amount of necrotic tissue within the wound bed including Adherent Slough. 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 - after HBO Dressing Change Frequency: Change Dressing every other day. - home health to change twice a week. wound center weekly. Wound Cleansing: Clean wound with Wound Cleanser Primary Wound Dressing: Wound #1 Left,Dorsal Toe Fourth: Silver Collagen - moisten with hydrogel. Wound #7 Right Amputation Site - Transmetatarsal: Silver Collagen - moisten with hydrogel. Wound #8 Left Toe Third: Silver Collagen - moisten with hydrogel Secondary Dressing: Wound #1 Left,Dorsal Toe Fourth: Kerlix/Rolled Gauze Wound #7 Right Amputation Site - Transmetatarsal: Kerlix/Rolled Gauze ABD pad Wound #8 Left Toe Third: Kerlix/Rolled Gauze Dry Gauze Edema Control: Avoid standing for long periods of time Elevate legs to the level of the heart or above for 30 minutes daily and/or when sitting, a frequency of: - throughout the day. Off-Loading: DH Walker Boot to: - right foot Open toe surgical shoe to: - felt in surgical shoe for 3rd toe. wound center to provide patient with shoe today. Home Health: [facility] for wound care. - [facility] twice a week. 1. We are using silver collagen to all wound areas 2. I will need to research Dr. Crissie Figures records. 3. The osteomyelitis has responded  wonderfully to HBO and  antibiotics. The wounds are closing. We did not have to use an advanced treatment product. 4. The patient sees Dr. Gwenlyn Found on Monday. Although thankfully today the wounds on her left third and fourth toes look better Electronic Signature(s) Signed: 11/13/2019 6:29:59 PM By: [name] MD Entered By: [name] on 11/13/2019 18:07:33 -------------------------------------------------------------------------------- SuperBill Details Patient Name: Date of Service: [name] 11/13/2019 Medical Record Number:[id number] Patient Account Number: [id number] Date of Birth/Sex: Treating RN: 12-Feb-1941 (79 y.o. [name] Primary Care Provider: [name] Other Clinician: Referring Provider: Treating Provider/[name]:Marlia Schewe, [name], [name] Weeks in Treatment: 17 Diagnosis Coding ICD-10 Codes Code Description E11.621 Type 2 diabetes mellitus with foot ulcer E11.52 Type 2 diabetes mellitus with diabetic peripheral angiopathy with gangrene L97.518 Non-pressure chronic ulcer of other part of right foot with other specified severity L97.528 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ity E11.42 Type 2 diabetes mellitus with diabetic polyneuropathy 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, 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 Facility Procedures The patient participates with Medicare or their insurance follows the Medicare Facility Guidelines: CPT4 Code Description Modifier Quantity [id number] - WOUND CARE VISIT-LEV 4 EST PT 1 Electronic Signature(s) Signed: 11/13/2019 6:29:59 PM By: [name] MD Entered By: [name] on 11/13/2019 18:07:56

## 2019-11-16 NOTE — Progress Notes (Addendum)
[name], [name] ([id number]) Visit Report for 11/16/2019 HBO Details Patient Name: Date of Service: [name], [name] 11/16/2019 2:00 PM Medical Record Number:[id number] Patient Account Number: [id number] Date of Birth/Sex: Treating RN: 1941-02-18 (79 y.o. [name] Primary Care Ercia Crisafulli: [name] Other Clinician: [name] Referring Clarkson Rosselli: Treating Zully Frane/[name]:[name], [name], [name] HBO Treatment Course Details Treatment Course Number: 1 Ordering Travion Ke: [name] Total Treatments Ordered: 40 HBO Treatment Start Date: 10/12/2019 HBO Indication: Diabetic Ulcer(s) of the Lower Extremity HBO Treatment Details Treatment Number: 25 Patient Type: Outpatient Chamber Type: Monoplace Chamber Serial #: [id number] Treatment Protocol: 2.5 ATA with 90 minutes oxygen, with two 5 minute air breaks Treatment Details Compression Rate Down: 2.0 psi / minute De-Compression Rate Up: 2.0 psi / minute Air breaks and CompressTx Pressure breathing periods DecompressDecompress Begins Reached (leave unused spaces Begins Ends blank) Chamber Pressure (ATA)1 2.5 2.5 2.5 2.5 2.5 --2.5 1 Clock Time (24 hr) 14:09 14:21 [id number]:62--26:33 16:13 Treatment Length: 124 (minutes) Treatment Segments: 4 Vital Signs Capillary Blood Glucose Reference Range: 80 - 120 mg / dl HBO Diabetic Blood Glucose Intervention Range: <131 mg/dl or >249 mg/dl Time Vitals Blood Respiratory Capillary Blood Glucose Pulse Action Type: Pulse: Temperature: Taken: Pressure: Rate: Glucose (mg/dl): Meter #: Oximetry (%) Taken: Pre 13:50 147/59 73 15 98.1 141 Post 16:15 149/61 74 14 98 102 Treatment Response Treatment Toleration: Well Treatment Completion Treatment Completed without Adverse Event Status: Additional Procedure Documentation Tissue Sevierity: Necrosis of bone Salle Brandle Notes No concerns with treatment given Physician HBO Attestation: I  certify that I supervised this HBO treatment in accordance with Medicare guidelines. A trained Yes emergency response team is readily available per hospital policies and procedures. Continue HBOT as ordered. Yes Electronic Signature(s) Signed: 11/16/2019 5:55:43 PM By: [name] MD Previous Signature: 11/16/2019 4:33:06 PM Version By: [name] EMT/HBOT Entered By: [name] on 11/16/2019 17:05:15 -------------------------------------------------------------------------------- HBO Safety Checklist Details Patient Name: Date of Service: [name] 11/16/2019 2:00 PM Medical Record Number:[id number] Patient Account Number: [id number] Date of Birth/Sex: Treating RN: September 21, 1941 (79 y.o. [name] Primary Care Teagen Bucio: [name] Other Clinician: [name] Referring Ayva Veilleux: Treating Ilia Engelbert/[name]:[name], [name], [name] HBO Safety Checklist Items Safety Checklist Consent Form Signed Patient voided / foley secured and emptied When did you last eato 1200 - ravioli / green beans Last dose of injectable or oral agent metformin NA Ostomy pouch emptied and vented if applicable All implantable devices assessed, documented and approved Intravenous access site secured and place PICC line left arm Valuables secured Linens and cotton and cotton/polyester blend (less than 51% polyester) Personal oil-based products / skin lotions / body lotions removed Wigs or hairpieces removed NA Smoking or tobacco materials removed Books / newspapers / magazines / loose paper removed Cologne, aftershave, perfume and deodorant removed Jewelry removed (may wrap wedding band) Make-up removed Hair care products removed NA Battery operated devices (external) removed NA Heating patches and chemical warmers removed NA Titanium eyewear removed Nail polish cured greater than 10 hours NA Casting material cured greater than 10  hours NA Hearing aids removed NA Loose dentures or partials removed NA Prosthetics have been removed Patient demonstrates correct use of air break device (if applicable) Patient concerns have been addressed Patient grounding bracelet on and cord attached to chamber Specifics for Inpatients (complete in addition to above) Medication sheet sent with patient Intravenous medications needed or due during therapy sent with patient Drainage tubes (e.g. nasogastric tube or  chest tube secured and vented) Endotracheal or Tracheotomy tube secured Cuff deflated of air and inflated with saline Airway suctioned Electronic Signature(s) Signed: 11/16/2019 2:13:51 PM By: [name] EMT/HBOT Entered By: [name] on 11/16/2019 14:13:50

## 2019-11-16 NOTE — Progress Notes (Signed)
[name], [name] ([id number]) Visit Report for 11/16/2019 SuperBill Details Patient Name: Date of Service: [name], [name] 11/16/2019 Medical Record Number:[id number] Patient Account Number: [id number] Date of Birth/Sex: Treating RN: November 28, 1940 (79 y.o. [name] Primary Care Provider: [name] Other Clinician: [name] Referring Provider: Treating Provider/[name]:Laurna Shetley, [name], [name] Diagnosis Coding ICD-10 Codes Code Description E11.621 Type 2 diabetes mellitus with foot ulcer E11.52 Type 2 diabetes mellitus with diabetic peripheral angiopathy with gangrene L97.518 Non-pressure chronic ulcer of other part of right foot with other specified severity L97.528 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ity E11.42 Type 2 diabetes mellitus with diabetic polyneuropathy 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, 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 Facility Procedures The patient participates with Medicare or their insurance follows the Medicare Facility Guidelines CPT4 Code Description Modifier Quantity [id number] G0277-(Facility Use Only) HBOT, full body chamber, 29min 4 Physician Procedures CPT4 Code Description Modifier Quantity [id number] - WC PHYS HYPERBARIC OXYGEN THERAPY 1 ICD-10 Diagnosis Description E11.621 Type 2 diabetes mellitus with foot ulcer Electronic Signature(s) Signed: 11/16/2019 4:33:06 PM By: [name] EMT/HBOT Signed: 11/16/2019 5:55:43 PM By: [name] MD Entered By: [name] on 11/16/2019 16:31:11

## 2019-11-17 ENCOUNTER — Encounter (HOSPITAL_BASED_OUTPATIENT_CLINIC_OR_DEPARTMENT_OTHER): Payer: Medicare Other | Admitting: Internal Medicine

## 2019-11-17 ENCOUNTER — Ambulatory Visit (INDEPENDENT_AMBULATORY_CARE_PROVIDER_SITE_OTHER): Payer: Medicare Other | Admitting: Cardiovascular Disease

## 2019-11-17 ENCOUNTER — Encounter: Payer: Self-pay | Admitting: Cardiovascular Disease

## 2019-11-17 VITALS — BP 114/50 | HR 62 | Temp 98.2°F | Ht 64.5 in | Wt 141.0 lb

## 2019-11-17 DIAGNOSIS — L97528 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ity: Secondary | ICD-10-CM | POA: Diagnosis not present

## 2019-11-17 DIAGNOSIS — I6523 Occlusion and stenosis of bilateral carotid arteries: Secondary | ICD-10-CM

## 2019-11-17 DIAGNOSIS — I998 Other disorder of circulatory system: Secondary | ICD-10-CM | POA: Diagnosis not present

## 2019-11-17 DIAGNOSIS — E1152 Type 2 diabetes mellitus with diabetic peripheral angiopathy with gangrene: Secondary | ICD-10-CM | POA: Diagnosis not present

## 2019-11-17 DIAGNOSIS — I70229 Atherosclerosis of native arteries of extremities with rest pain, unspecified extremity: Secondary | ICD-10-CM

## 2019-11-17 DIAGNOSIS — E11621 Type 2 diabetes mellitus with foot ulcer: Secondary | ICD-10-CM | POA: Diagnosis not present

## 2019-11-17 DIAGNOSIS — L97514 Non-pressure chronic ulcer of other part of right foot with necrosis of bone: Secondary | ICD-10-CM | POA: Diagnosis not present

## 2019-11-17 DIAGNOSIS — E1142 Type 2 diabetes mellitus with diabetic polyneuropathy: Secondary | ICD-10-CM | POA: Diagnosis not present

## 2019-11-17 DIAGNOSIS — L97518 Non-pressure chronic ulcer of other part of right foot with other specified severity: Secondary | ICD-10-CM | POA: Diagnosis not present

## 2019-11-17 DIAGNOSIS — L97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 limited to breakdown of skin: Secondary | ICD-10-CM | POA: Diagnosis not present

## 2019-11-17 LAB — GLUCOSE, CAPILLARY
Glucose-Capillary: 143 mg/dL — ABNORMAL HIGH (ref 70–99)
Glucose-Capillary: 120 mg/dL — ABNORMAL HIGH (ref 70–99)

## 2019-11-17 MED ORDER — SODIUM CHLORIDE 0.9% FLUSH: 3.0000 mL | Freq: Two times a day (BID) | INTRAVENOUS | Status: DC

## 2019-11-17 NOTE — Assessment & Plan Note (Signed)
[name] returns today for follow-up.  Her right TMA is healing well with the assistance of hyperbaric oxygen.  She does have residual ischemic lesions on her left foot in particular the tip of her left third toe and the top of her left fourth toe which apparently has osteomyelitis by MRI.  She is on 2 IV antibiotics.  I previously try to recanalize her distal left SFA/above-the-knee popliteal artery CTO September 2020 unsuccessfully because of inability to reenter the true lumen at the level of the knee.  She has two-vessel runoff via the peroneal and anterior tibial.  I have reviewed her anatomy with Dr. Fletcher Anon who feels that he can perform tibial pedal access via the dorsalis pedis in an effort to recanalize the CTO and provide inline blood flow to facilitate healing.  This will occur next Wednesday.

## 2019-11-17 NOTE — H&P (View-Only) (Signed)
11/17/2019 [name]   [birth year]  [id number]  Primary Physician [name], MD Primary Cardiologist: [name] MD [name], [place]  HPI:  [name] is a 79 y.o.  mildly overweight widowed Caucasian female mother of 59, grandmother 7 grandchildren referred by Dr. Jacqualyn Posey, her podiatrist for critical limb ischemia.I last saw her in the office  08/18/2019. Shedoes have a history of treated hypertension, diabetes and hyperlipidemia. She is never had a heart attack or stroke. She denies chest pain or shortness of breath. 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 She has some local trauma related to socks over Christmas and has developed ischemic appearing wounds on her right second and third toes with recent Dopplers performed 10/09/2018 revealing a right ABI 0.75, left 2.79 with moderately severe right SFA stenosis and an occluded right posterior tibial artery. She is seeing Dr. Jacqualyn Posey back regularly for aggressive local wound care.  Herwounds continue to heal on her right and left second toes. These sores are probably a result of local trauma. Recent ABIs are in the0.6range with a high-grade right SFA stenosis and an occluded left popliteal with tibial vessel disease as well. She does complain of some claudication.She wishes to proceed with endovascular therapy for wound healing and lifestyle limiting claudication.  I performed angiography on her 06/15/2019 with a failed attempt at crossing the left SFA CTO. She has one-vessel runoff bilaterally. She did develop gangrenous right first and second toes. 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 Trula Slade who performed right common femoral endarterectomy with patch angioplasty 07/21/2019 with excellent result. She did see Dr. Jacqualyn Posey back in the  office late last week who thought her wounds were stable. The pain in her feet has improved since her endarterectomy. The plan is to perform orbital atherectomy and PTA of her calcified right SFA as well as intervention on her right tibioperoneal trunk prior to right TMA by Dr. Earleen Newport for limb salvage.  I performed orbital atherectomy followed by drug-coated balloon angioplasty of her right SFA, popliteal and tibioperoneal trunk on 08/03/2019 with excellent result.  Her follow-up Dopplers performed 08/14/2019 revealed a widely patent SFA and tibioperoneal trunk.  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 Jacqualyn Posey on 08/07/2019 which almost completely healed with the assistance of hyperbaric oxygen.  She has been seeing Dr. Dellia Nims in the wound care clinic on a weekly basis.  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  She is on 2 IV antibiotics and continues to get hyperbaric oxygen.  Dr. Dellia Nims does not feel these will heal without inline flow.  The patient is a candidate for tibial pedal access of the left dorsalis pedis.  I reviewed her anatomy with Dr. Velva Harman who is agreed to attempt this next Wednesday.  Current Meds  Medication Sig  . acetaminophen (TYLENOL) 500 MG tablet Take 1,000 mg by mouth every 6 (six) hours as needed for moderate pain or headache.  . Ascorbic Acid (VITAMIN C) 1000 MG tablet Take 1,000 mg by mouth daily with supper.   Marland Kitchen aspirin 81 MG tablet Take 81 mg by mouth every evening.   . calcium carbonate (TUMS - DOSED IN MG ELEMENTAL CALCIUM) 500 MG chewable tablet Chew 2 tablets by mouth daily as needed for indigestion  or heartburn.  Marland Kitchen CINNAMON PO Take 1,000 mg by mouth 3 (three) times daily with meals.   . clopidogrel (PLAVIX) 75 MG tablet Take 1 tablet by mouth once daily  . DAPTOmycin (CUBICIN) 500 MG injection   . diltiazem (CARTIA XT) 240 MG 24 hr capsule Take 1 capsule (240 mg total) by  mouth daily.  Marland Kitchen glipiZIDE (GLUCOTROL XL) 5 MG 24 hr tablet Take 1 tablet by mouth once daily with breakfast  . hydrochlorothiazide (HYDRODIURIL) 25 MG tablet Take 1 tablet (25 mg total) by mouth daily.  . INVOKANA 100 MG TABS tablet TAKE 1 TABLET BY MOUTH BEFORE BREAKFAST  . Magnesium 250 MG TABS Take 250 mg by mouth daily with supper.  . meropenem (MERREM) 1 g injection   . metFORMIN (GLUCOPHAGE) 1000 MG tablet Take 1 tablet (1,000 mg total) by mouth 2 (two) times daily.  . metoprolol (LOPRESSOR) 100 MG tablet Take half tab in the morning and a whole tab in the evening. (Patient taking differently: Take 100 mg by mouth 2 (two) times daily. )  . OZEMPIC, 0.25 OR 0.5 MG/DOSE, 2 MG/1.5ML SOPN   . pantoprazole (PROTONIX) 40 MG tablet Take 1 tablet (40 mg total) by mouth daily. (Patient taking differently: Take 40 mg by mouth 3 (three) times a week. )  . telmisartan (MICARDIS) 80 MG tablet Take 1 tablet (80 mg total) by mouth daily. (Patient taking differently: Take 40 mg by mouth every evening. )  . vitamin B-12 (CYANOCOBALAMIN) 1000 MCG tablet Take 1,000 mcg by mouth daily with supper.  . [DISCONTINUED] cefdinir (OMNICEF) 300 MG capsule Take 300 mg by mouth 2 (two) times daily.   Current Facility-Administered Medications for the 11/17/19 encounter (Office Visit) with [name], MD  Medication  . sodium chloride flush (NS) 0.9 % injection 3 mL     Allergies  Allergen Reactions  . Bactrim [Sulfamethoxazole-Trimethoprim] Other (See Comments)    ^ K+( elevated)   . Demerol [Meperidine]     Delusional   . Scopolamine     Delusional   . Versed [Midazolam] Anxiety    Frantic, out of my mind, agitated     Social History   Socioeconomic History  . Marital status: Widowed    Spouse name: Not on file  . Number of children: Not on file  . Years of education: Not on file  . Highest education level: Not on file  Occupational History  . Not on file  Tobacco Use  . Smoking status:  Former Smoker    Quit date: 03/31/1972    Years since quitting: 47.6  . Smokeless tobacco: Never Used  Substance and Sexual Activity  . Alcohol use: No  . Drug use: No  . Sexual activity: Not on file  Other Topics Concern  . Not on file  Social History Narrative  . Not on file   Social Determinants of Health   Financial Resource Strain:   . Difficulty of Paying Living Expenses: Not on file  Food Insecurity:   . Worried About Charity fundraiser in the Last Year: Not on file  . Ran Out of Food in the Last Year: Not on file  Transportation Needs:   . Lack of Transportation (Medical): Not on file  . Lack of Transportation (Non-Medical): Not on file  Physical Activity:   . Days of Exercise per Week: Not on file  . Minutes of Exercise per Session: Not on file  Stress:   . Feeling of Stress :  Not on file  Social Connections:   . Frequency of Communication with Friends and Family: Not on file  . Frequency of Social Gatherings with Friends and Family: Not on file  . Attends Religious Services: Not on file  . Active Member of Clubs or Organizations: Not on file  . Attends Archivist Meetings: Not on file  . Marital Status: Not on file  Intimate Partner Violence:   . Fear of Current or Ex-Partner: Not on file  . Emotionally Abused: Not on file  . Physically Abused: Not on file  . Sexually Abused: Not on file     Review of Systems: General: negative for chills, fever, night sweats or weight changes.  Cardiovascular: negative for chest pain, dyspnea on exertion, edema, orthopnea, palpitations, paroxysmal nocturnal dyspnea or shortness of breath Dermatological: negative for rash Respiratory: negative for cough or wheezing Urologic: negative for hematuria Abdominal: negative for nausea, vomiting, diarrhea, bright red blood per rectum, melena, or hematemesis Neurologic: negative for visual changes, syncope, or dizziness All other systems reviewed and are otherwise negative  except as noted above.    Blood pressure (!) 114/50, pulse 62, temperature 98.2 F (36.8 C), height 5' 4.5" (1.638 m), weight 141 lb (64 kg).    EKG not performed today  ASSESSMENT AND PLAN:   Critical lower limb ischemia [name] returns today for follow-up.  Her right TMA is healing well with the assistance of hyperbaric oxygen.  She does have residual ischemic lesions on her left foot in particular the tip of her left third toe and the top of her left fourth toe which apparently has osteomyelitis by MRI.  She is on 2 IV antibiotics.  I previously try to recanalize her distal left SFA/above-the-knee popliteal artery CTO September 2020 unsuccessfully because of inability to reenter the true lumen at the level of the knee.  She has two-vessel runoff via the peroneal and anterior tibial.  I have reviewed her anatomy with Dr. Fletcher Anon who feels that he can perform tibial pedal access via the dorsalis pedis in an effort to recanalize the CTO and provide inline blood flow to facilitate healing.  This will occur next Wednesday.      [name] MD FACP,FACC,FAHA, [facility] 11/17/2019 9:36 AM

## 2019-11-17 NOTE — Progress Notes (Signed)
[name], [name] ([id number]) Visit Report for 11/17/2019 Arrival Information Details Patient Name: Date of Service: [name], [name] 11/17/2019 2:00 PM Medical Record Number:[id number] Patient Account Number: [id number] Date of Birth/Sex: Treating RN: [birth year] (79 y.o. [name] Primary Care [name]: [name] Other Clinician: Mikeal [name] Referring [name]: Treating [name]/[name]:[name], [name], [name] in Treatment: 18 Visit Information History Since Last Visit Added or deleted any medications: No Patient Arrived: [name] Any new allergies or adverse reactions: No Arrival Time: 13:50 Had a fall or experienced change in No Accompanied By: self activities of daily living that may affect Transfer Assistance: None risk of falls: Patient Identification Verified: Yes Signs or symptoms of abuse/neglect since last No Secondary Verification Process Yes visito Completed: Hospitalized since last visit: No Patient Requires Transmission- No Implantable device outside of the clinic excluding No Based Precautions: cellular tissue based products placed in the center Patient Has Alerts: Yes since last visit: Patient Alerts: Right ABI .62 TBI Pain Present Now: No refused Left ABI .60 TBI .14 Electronic Signature(s) Signed: 11/17/2019 5:16:59 PM By: [name] EMT/HBOT Entered By: [name] on 11/17/2019 14:11:29 -------------------------------------------------------------------------------- Encounter Discharge Information Details Patient Name: Date of Service: [name] 11/17/2019 2:00 PM Medical Record Number:[id number] Patient Account Number: [id number] Date of Birth/Sex: Treating RN: 1941-02-09 (79 y.o. [name] Primary Care [name]: [name] Other Clinician: Mikeal [name] Referring [name]: Treating [name]/[name]:[name], [name], [name] in Treatment: 18 Encounter Discharge  Information Items Discharge Condition: Stable Ambulatory Status: Walker Discharge Destination: Home Transportation: Private Auto Accompanied By: self Schedule Follow-up Appointment: Yes Clinical Summary of Care: Patient Declined Electronic Signature(s) Signed: 11/17/2019 5:16:59 PM By: [name] EMT/HBOT Entered By: [name] on 11/17/2019 17:14:34 -------------------------------------------------------------------------------- Patient/Caregiver Education Details Patient Name: Date of Service: [name], [name] H. 2/23/2021andnbsp2:00 PM Medical Record Patient Account Number: [id number] Number: Treating RN: [name] Date of Birth/Gender: 10/20/1940 (79 y.o. F) Other Clinician: Mikeal [name] Primary Care Physician: [name] Referring Physician: Physician/[name]: [name] in Treatment: 17 Education Assessment Education Provided To: Patient Education Topics Provided Hyperbaric Oxygenation: Methods: Explain/Verbal Responses: State content correctly Electronic Signature(s) Signed: 11/17/2019 5:16:59 PM By: [name] EMT/HBOT Entered By: [name] on 11/17/2019 17:14:19 -------------------------------------------------------------------------------- Vitals Details Patient Name: Date of Service: [name] 11/17/2019 2:00 PM Medical Record Number:[id number] Patient Account Number: [id number] Date of Birth/Sex: Treating RN: 1940/10/23 (79 y.o. [name] Primary Care [name]: [name] Other Clinician: Mikeal [name] Referring Treasure Ochs: Treating [name]/[name]:[name], [name], [name] in Treatment: 18 Vital Signs Time Taken: 13:55 Temperature (F): 98.4 Height (in): 64 Pulse (bpm): 65 Weight (lbs): 142 Respiratory Rate (breaths/min): 16 Body Mass Index (BMI): 24.4 Blood Pressure (mmHg): 147/43 Capillary Blood Glucose (mg/dl): 143 Reference Range:  80 - 120 mg / dl Electronic Signature(s) Signed: 11/17/2019 5:16:59 PM By: [name] EMT/HBOT Entered By: [name] on 11/17/2019 14:11:45

## 2019-11-17 NOTE — Progress Notes (Signed)
11/17/2019 [name] DULEY   September 25, 1940  [id number]  Primary Physician [name], MD Primary Cardiologist: [name] MD [name], [place]  HPI:  [name] is a 79 y.o.  mildly overweight widowed Caucasian female mother of 89, grandmother 7 grandchildren referred by Dr. Jacqualyn Posey, her podiatrist for critical limb ischemia.I last saw her in the office  08/18/2019. Shedoes have a history of treated hypertension, diabetes and hyperlipidemia. She is never had a heart attack or stroke. She denies chest pain or shortness of breath. 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 She has some local trauma related to socks over Christmas and has developed ischemic appearing wounds on her right second and third toes with recent Dopplers performed 10/09/2018 revealing a right ABI 0.75, left 2.79 with moderately severe right SFA stenosis and an occluded right posterior tibial artery. She is seeing Dr. Jacqualyn Posey back regularly for aggressive local wound care.  Herwounds continue to heal on her right and left second toes. These sores are probably a result of local trauma. Recent ABIs are in the0.6range with a high-grade right SFA stenosis and an occluded left popliteal with tibial vessel disease as well. She does complain of some claudication.She wishes to proceed with endovascular therapy for wound healing and lifestyle limiting claudication.  I performed angiography on her 06/15/2019 with a failed attempt at crossing the left SFA CTO. She has one-vessel runoff bilaterally. She did develop gangrenous right first and second toes. 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 Trula Slade who performed right common femoral endarterectomy with patch angioplasty 07/21/2019 with excellent result. She did see Dr. Jacqualyn Posey back in the  office late last week who thought her wounds were stable. The pain in her feet has improved since her endarterectomy. The plan is to perform orbital atherectomy and PTA of her calcified right SFA as well as intervention on her right tibioperoneal trunk prior to right TMA by Dr. Earleen Newport for limb salvage.  I performed orbital atherectomy followed by drug-coated balloon angioplasty of her right SFA, popliteal and tibioperoneal trunk on 08/03/2019 with excellent result.  Her follow-up Dopplers performed 08/14/2019 revealed a widely patent SFA and tibioperoneal trunk.  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 Jacqualyn Posey on 08/07/2019 which almost completely healed with the assistance of hyperbaric oxygen.  She has been seeing Dr. Dellia Nims in the wound care clinic on a weekly basis.  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  She is on 2 IV antibiotics and continues to get hyperbaric oxygen.  Dr. Dellia Nims does not feel these will heal without inline flow.  The patient is a candidate for tibial pedal access of the left dorsalis pedis.  I reviewed her anatomy with Dr. Velva Harman who is agreed to attempt this next Wednesday.  Current Meds  Medication Sig  . acetaminophen (TYLENOL) 500 MG tablet Take 1,000 mg by mouth every 6 (six) hours as needed for moderate pain or headache.  . Ascorbic Acid (VITAMIN C) 1000 MG tablet Take 1,000 mg by mouth daily with supper.   Marland Kitchen aspirin 81 MG tablet Take 81 mg by mouth every evening.   . calcium carbonate (TUMS - DOSED IN MG ELEMENTAL CALCIUM) 500 MG chewable tablet Chew 2 tablets by mouth daily as needed for indigestion  or heartburn.  Marland Kitchen CINNAMON PO Take 1,000 mg by mouth 3 (three) times daily with meals.   . clopidogrel (PLAVIX) 75 MG tablet Take 1 tablet by mouth once daily  . DAPTOmycin (CUBICIN) 500 MG injection   . diltiazem (CARTIA XT) 240 MG 24 hr capsule Take 1 capsule (240 mg total) by  mouth daily.  Marland Kitchen glipiZIDE (GLUCOTROL XL) 5 MG 24 hr tablet Take 1 tablet by mouth once daily with breakfast  . hydrochlorothiazide (HYDRODIURIL) 25 MG tablet Take 1 tablet (25 mg total) by mouth daily.  . INVOKANA 100 MG TABS tablet TAKE 1 TABLET BY MOUTH BEFORE BREAKFAST  . Magnesium 250 MG TABS Take 250 mg by mouth daily with supper.  . meropenem (MERREM) 1 g injection   . metFORMIN (GLUCOPHAGE) 1000 MG tablet Take 1 tablet (1,000 mg total) by mouth 2 (two) times daily.  . metoprolol (LOPRESSOR) 100 MG tablet Take half tab in the morning and a whole tab in the evening. (Patient taking differently: Take 100 mg by mouth 2 (two) times daily. )  . OZEMPIC, 0.25 OR 0.5 MG/DOSE, 2 MG/1.5ML SOPN   . pantoprazole (PROTONIX) 40 MG tablet Take 1 tablet (40 mg total) by mouth daily. (Patient taking differently: Take 40 mg by mouth 3 (three) times a week. )  . telmisartan (MICARDIS) 80 MG tablet Take 1 tablet (80 mg total) by mouth daily. (Patient taking differently: Take 40 mg by mouth every evening. )  . vitamin B-12 (CYANOCOBALAMIN) 1000 MCG tablet Take 1,000 mcg by mouth daily with supper.  . [DISCONTINUED] cefdinir (OMNICEF) 300 MG capsule Take 300 mg by mouth 2 (two) times daily.   Current Facility-Administered Medications for the 11/17/19 encounter (Office Visit) with [name], MD  Medication  . sodium chloride flush (NS) 0.9 % injection 3 mL     Allergies  Allergen Reactions  . Bactrim [Sulfamethoxazole-Trimethoprim] Other (See Comments)    ^ K+( elevated)   . Demerol [Meperidine]     Delusional   . Scopolamine     Delusional   . Versed [Midazolam] Anxiety    Frantic, out of my mind, agitated     Social History   Socioeconomic History  . Marital status: Widowed    Spouse name: Not on file  . Number of children: Not on file  . Years of education: Not on file  . Highest education level: Not on file  Occupational History  . Not on file  Tobacco Use  . Smoking status:  Former Smoker    Quit date: 03/31/1972    Years since quitting: 47.6  . Smokeless tobacco: Never Used  Substance and Sexual Activity  . Alcohol use: No  . Drug use: No  . Sexual activity: Not on file  Other Topics Concern  . Not on file  Social History Narrative  . Not on file   Social Determinants of Health   Financial Resource Strain:   . Difficulty of Paying Living Expenses: Not on file  Food Insecurity:   . Worried About Charity fundraiser in the Last Year: Not on file  . Ran Out of Food in the Last Year: Not on file  Transportation Needs:   . Lack of Transportation (Medical): Not on file  . Lack of Transportation (Non-Medical): Not on file  Physical Activity:   . Days of Exercise per Week: Not on file  . Minutes of Exercise per Session: Not on file  Stress:   . Feeling of Stress :  Not on file  Social Connections:   . Frequency of Communication with Friends and Family: Not on file  . Frequency of Social Gatherings with Friends and Family: Not on file  . Attends Religious Services: Not on file  . Active Member of Clubs or Organizations: Not on file  . Attends Archivist Meetings: Not on file  . Marital Status: Not on file  Intimate Partner Violence:   . Fear of Current or Ex-Partner: Not on file  . Emotionally Abused: Not on file  . Physically Abused: Not on file  . Sexually Abused: Not on file     Review of Systems: General: negative for chills, fever, night sweats or weight changes.  Cardiovascular: negative for chest pain, dyspnea on exertion, edema, orthopnea, palpitations, paroxysmal nocturnal dyspnea or shortness of breath Dermatological: negative for rash Respiratory: negative for cough or wheezing Urologic: negative for hematuria Abdominal: negative for nausea, vomiting, diarrhea, bright red blood per rectum, melena, or hematemesis Neurologic: negative for visual changes, syncope, or dizziness All other systems reviewed and are otherwise negative  except as noted above.    Blood pressure (!) 114/50, pulse 62, temperature 98.2 F (36.8 C), height 5' 4.5" (1.638 m), weight 141 lb (64 kg).    EKG not performed today  ASSESSMENT AND PLAN:   Critical lower limb ischemia Ms. Kutch returns today for follow-up.  Her right TMA is healing well with the assistance of hyperbaric oxygen.  She does have residual ischemic lesions on her left foot in particular the tip of her left third toe and the top of her left fourth toe which apparently has osteomyelitis by MRI.  She is on 2 IV antibiotics.  I previously try to recanalize her distal left SFA/above-the-knee popliteal artery CTO September 2020 unsuccessfully because of inability to reenter the true lumen at the level of the knee.  She has two-vessel runoff via the peroneal and anterior tibial.  I have reviewed her anatomy with Dr. Fletcher Anon who feels that he can perform tibial pedal access via the dorsalis pedis in an effort to recanalize the CTO and provide inline blood flow to facilitate healing.  This will occur next Wednesday.      [name] MD FACP,FACC,FAHA, [facility] 11/17/2019 9:36 AM

## 2019-11-17 NOTE — Progress Notes (Signed)
[name], [name] ([id number]) Visit Report for 11/17/2019 SuperBill Details Patient Name: Date of Service: [name], [name] 11/17/2019 Medical Record Number:[id number] Patient Account Number: [id number] Date of Birth/Sex: Treating RN: 01-19-1941 (79 y.o. 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, 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 Facility Procedures The patient participates with Medicare or their insurance follows the Medicare Facility Guidelines CPT4 Code Description Modifier Quantity [id number] G0277-(Facility Use Only) HBOT, full body chamber, 40min 4 Physician Procedures CPT4 Code Description Modifier Quantity [id number] - WC PHYS HYPERBARIC OXYGEN THERAPY 1 ICD-10 Diagnosis Description E11.621 Type 2 diabetes mellitus with foot ulcer Electronic Signature(s) Signed: 11/17/2019 5:16:59 PM By: [name] EMT/HBOT Signed: 11/17/2019 6:00:34 PM By: [name] MD Entered By: [name] on 11/17/2019 17:14:04

## 2019-11-17 NOTE — Progress Notes (Addendum)
JALEXA, PIFER ([id number]) Visit Report for 11/17/2019 HBO Details Patient Name: Date of Service: YURIDIA, COUTS 11/17/2019 2:00 PM Medical Record Number:[id number] Patient Account Number: [id number] Date of Birth/Sex: Treating RN: 1941-01-25 (79 y.o. [name] Primary Care Deny Chevez: [name] Other Clinician: [name] Referring Sha Amer: Treating Josiel Gahm/[name]:[name], [name], [name] in Treatment: 18 HBO Treatment Course Details Treatment Course Number: 1 Ordering Akshara Blumenthal: [name] Total Treatments Ordered: 40 HBO Treatment Start Date: 10/12/2019 HBO Indication: Diabetic Ulcer(s) of the Lower Extremity HBO Treatment Details Treatment Number: 26 Patient Type: Outpatient Chamber Type: Monoplace Chamber Serial #: [id number] Treatment Protocol: 2.5 ATA with 90 minutes oxygen, with two 5 minute air breaks Treatment Details Compression Rate Down: 2.0 psi / minute De-Compression Rate Up: 2.0 psi / minute Air breaks and CompressTx Pressure breathing periods DecompressDecompress Begins Reached (leave unused spaces Begins Ends blank) Chamber Pressure (ATA)1 2.5 2.5 2.5 2.5 2.5 --2.5 1 Clock Time (24 hr) 14:10 14:22 28:3151:7616:0737:10--62:69 16:14 Treatment Length: 124 (minutes) Treatment Segments: 4 Vital Signs Capillary Blood Glucose Reference Range: 80 - 120 mg / dl HBO Diabetic Blood Glucose Intervention Range: <131 mg/dl or >249 mg/dl Time Vitals Blood Respiratory Capillary Blood Glucose Pulse Action Type: Pulse: Temperature: Taken: Pressure: Rate: Glucose (mg/dl): Meter #: Oximetry (%) Taken: Pre 13:55 147/43 65 16 98.4 143 Post 16:17 157/46 67 15 98.1 120 Treatment Response Treatment Toleration: Well Treatment Completion Treatment Completed without Adverse Event Status: Additional Procedure Documentation Tissue Sevierity: Necrosis of bone Alisi Lupien Notes No concerns with treatment given. I had a communication from  Dr. Gwenlyn Found who is requiring for an angiogram on the left leg early next week. I think he will make an attempt at retrograde access Physician HBO Attestation: I certify that I supervised this HBO treatment in accordance with Medicare guidelines. A trained Yes emergency response team is readily available per hospital policies and procedures. Continue HBOT as ordered. Yes Electronic Signature(s) Signed: 11/17/2019 6:00:34 PM By: [name] MD Previous Signature: 11/17/2019 5:16:59 PM Version By: [name] EMT/HBOT Entered By: [name] on 11/17/2019 17:28:21 -------------------------------------------------------------------------------- HBO Safety Checklist Details Patient Name: Date of Service: [name] 11/17/2019 2:00 PM Medical Record Number:[id number] Patient Account Number: [id number] Date of Birth/Sex: Treating RN: 1941-09-15 (79 y.o. [name] Primary Care Latonga Ponder: [name] Other Clinician: [name] Referring Cadence Haslam: Treating Zayde Stroupe/[name]:[name], [name], [name] HBO Safety Checklist Items Safety Checklist Consent Form Signed Patient voided / foley secured and emptied When did you last eato 1200 - chicken sammy/boost Last dose of injectable or oral agent metformin NA Ostomy pouch emptied and vented if applicable All implantable devices assessed, documented and approved Intravenous access site secured and place PICC line left arm Valuables secured Linens and cotton and cotton/polyester blend (less than 51% polyester) Personal oil-based products / skin lotions / body lotions removed Wigs or hairpieces removed NA Smoking or tobacco materials removed Books / newspapers / magazines / loose paper removed Cologne, aftershave, perfume and deodorant removed Jewelry removed (may wrap wedding band) Make-up removed Hair care products removed NA Battery operated devices (external) removed NA  Heating patches and chemical warmers removed NA Titanium eyewear removed Nail polish cured greater than 10 hours NA Casting material cured greater than 10 hours NA Hearing aids removed NA Loose dentures or partials removed NA Prosthetics have been removed Patient demonstrates correct use of air break device (if applicable) Patient concerns have been addressed Patient grounding bracelet on and cord attached to chamber Specifics  for Inpatients (complete in addition to above) Medication sheet sent with patient Intravenous medications needed or due during therapy sent with patient Drainage tubes (e.g. nasogastric tube or chest tube secured and vented) Endotracheal or Tracheotomy tube secured Cuff deflated of air and inflated with saline Airway suctioned Electronic Signature(s) Signed: 11/17/2019 2:12:32 PM By: [name] EMT/HBOT Entered By: [name] on 11/17/2019 14:12:31

## 2019-11-17 NOTE — Patient Instructions (Addendum)
Medication Instructions:  Hold Metformin the day of the procedure and 48 hours after procedure on 11/25/2019  If you need a refill on your cardiac medications before your next appointment, please call your pharmacy.   Lab work: CBC, BMET If you have labs (blood work) drawn today and your tests are completely normal, you will receive your results only by: [place] (if you have MyChart) OR A paper copy in the mail If you have any lab test that is abnormal or we need to change your treatment, we will call you to review the results.  Testing/Procedures: Your physician has requested that you have a peripheral vascular angiogram on 11/25/2019. This exam is performed at the hospital. During this exam IV contrast is used to look at arterial blood flow. Please review the information sheet given for details.  AND ONE WEEK AFTER YOUR PROCEDURE on 11/25/2019 Your physician has requested that you have a lower extremity arterial exercise duplex. During this test, exercise and ultrasound are used to evaluate arterial blood flow in the legs. Allow one hour for this exam. There are no restrictions or special instructions.  AND  Your physician has requested that you have an ankle brachial index (ABI). During this test an ultrasound and blood pressure cuff are used to evaluate the arteries that supply the arms and legs with blood. Allow thirty minutes for this exam. There are no restrictions or special instructions.   AND  IN ONE YEAR:  Your physician has requested that you have a carotid duplex. This test is an ultrasound of the carotid arteries in your neck. It looks at blood flow through these arteries that supply the brain with blood. Allow one hour for this exam. There are no restrictions or special instructions.  Follow-Up: At [facility], you and your health needs are our priority.  As part of our continuing mission to provide you with exceptional heart care, we have created designated Provider  Care Teams.  These Care Teams include your primary Cardiologist (physician) and Advanced Practice Providers (APPs -  Physician Assistants and Nurse Practitioners) who all work together to provide you with the care you need, when you need it. You may see [name], MD or one of the following Advanced Practice Providers on your designated Care Team:    Kerin Ransom, PA-C  [place], [place]  Coletta Memos, [place]  Your physician wants you to follow-up in: 2 weeks after procedure 11/25/2019  Any Other Special Instructions Will Be Listed Below (If Applicable).  You are scheduled for a Peripheral Angiogram on Wednesday, March 3 with Dr. Kathlyn Sacramento.  1. Please arrive at the [facility] (Main Entrance A) at [facility]: [address] [place],  [address] at 8:30 AM (This time is two hours before your procedure to ensure your preparation). Free valet parking service is available.   Special note: Every effort is made to have your procedure done on time. Please understand that emergencies sometimes delay scheduled procedures.  2. Diet: Do not eat solid foods after midnight.  The patient may have clear liquids until 5am upon the day of the procedure.  3. Labs: You will need to have blood drawn today.  4. Medication instructions in preparation for your procedure:   Contrast Allergy: No HOLD METFORMIN THE DAY OF THE PROCEDURE AND 48 HOURS AFTER  On the morning of your procedure, take your Plavix/Clopidogrel and any morning medicines NOT listed above.  You may use sips of water.  5. Plan  for one night stay--bring personal belongings. 6. Bring a current list of your medications and current insurance cards. 7. You MUST have a responsible person to drive you home. 8. Someone MUST be with you the first 24 hours after you arrive home or your discharge will be delayed. 9. Please wear clothes that are easy to get on and off and wear slip-on shoes.  You have to be tested for Covid  prior to procedure. This will be scheduled on: 12/24/2019 @ 10:50am. This is a Drive Up Visit at the [facility] [address] [place]. Someone will direct you to the appropriate testing line. Stay in your car and someone will be with you shortly.   Thank you for allowing [place] to care for you!   -- [facility]

## 2019-11-18 ENCOUNTER — Ambulatory Visit: Payer: Medicare Other | Admitting: Cardiovascular Disease

## 2019-11-18 ENCOUNTER — Encounter (HOSPITAL_BASED_OUTPATIENT_CLINIC_OR_DEPARTMENT_OTHER): Payer: Medicare Other | Admitting: Physician Assistant

## 2019-11-18 ENCOUNTER — Other Ambulatory Visit: Payer: Self-pay

## 2019-11-18 DIAGNOSIS — E1142 Type 2 diabetes mellitus with diabetic polyneuropathy: Secondary | ICD-10-CM | POA: Diagnosis not present

## 2019-11-18 DIAGNOSIS — I10 Essential (primary) hypertension: Secondary | ICD-10-CM | POA: Diagnosis not present

## 2019-11-18 DIAGNOSIS — L97518 Non-pressure chronic ulcer of other part of right foot with other specified severity: Secondary | ICD-10-CM | POA: Diagnosis not present

## 2019-11-18 DIAGNOSIS — L97514 Non-pressure chronic ulcer of other part of right foot with necrosis of bone: Secondary | ICD-10-CM | POA: Diagnosis not present

## 2019-11-18 DIAGNOSIS — E1152 Type 2 diabetes mellitus with diabetic peripheral angiopathy with gangrene: Secondary | ICD-10-CM | POA: Diagnosis not present

## 2019-11-18 DIAGNOSIS — D649 Anemia, unspecified: Secondary | ICD-10-CM | POA: Diagnosis not present

## 2019-11-18 DIAGNOSIS — Z4781 Encounter for orthopedic aftercare following surgical amputation: Secondary | ICD-10-CM | POA: Diagnosis not present

## 2019-11-18 DIAGNOSIS — L97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 limited to breakdown of skin: Secondary | ICD-10-CM | POA: Diagnosis not present

## 2019-11-18 DIAGNOSIS — L97528 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ity: Secondary | ICD-10-CM | POA: Diagnosis not present

## 2019-11-18 DIAGNOSIS — E11621 Type 2 diabetes mellitus with foot ulcer: Secondary | ICD-10-CM | POA: Diagnosis not present

## 2019-11-18 LAB — GLUCOSE, CAPILLARY
Glucose-Capillary: 131 mg/dL — ABNORMAL HIGH (ref 70–99)
Glucose-Capillary: 144 mg/dL — ABNORMAL HIGH (ref 70–99)

## 2019-11-18 LAB — BASIC METABOLIC PANEL
BUN/Creatinine Ratio: 37 — ABNORMAL HIGH (ref 12–28)
BUN: 29 mg/dL — ABNORMAL HIGH (ref 8–27)
CO2: 27 mmol/L (ref 20–29)
Calcium: 10.5 mg/dL — ABNORMAL HIGH (ref 8.7–10.3)
Chloride: 101 mmol/L (ref 96–106)
Creatinine, Ser: 0.79 mg/dL (ref 0.57–1.00)
GFR calc Af Amer: 84 mL/min/{1.73_m2} (ref >59)
GFR calc non Af Amer: 72 mL/min/{1.73_m2} (ref >59)
Glucose: 157 mg/dL — ABNORMAL HIGH (ref 65–99)
Potassium: 5.4 mmol/L — ABNORMAL HIGH (ref 3.5–5.2)
Sodium: 142 mmol/L (ref 134–144)

## 2019-11-18 LAB — CBC
Hematocrit: 34.7 % (ref 34.0–46.6)
Hemoglobin: 11.4 g/dL (ref 11.1–15.9)
MCH: 28.3 pg (ref 26.6–33.0)
MCHC: 32.9 g/dL (ref 31.5–35.7)
MCV: 86 fL (ref 79–97)
Platelets: 344 10*3/uL (ref 150–450)
RBC: 4.03 x10E6/uL (ref 3.77–5.28)
RDW: 14.2 % (ref 11.7–15.4)
WBC: 9.8 10*3/uL (ref 3.4–10.8)

## 2019-11-18 NOTE — Progress Notes (Signed)
[name], [name] ([id number]) Visit Report for 11/18/2019 Arrival Information Details Patient Name: Date of Service: [name], [name] 11/18/2019 2:00 PM Medical Record Number:[id number] Patient Account Number: [id number] Date of Birth/Sex: Treating RN: 07-Oct-1940 (79 y.o. [name] Primary Care [name]: [name] Other Clinician: Mikeal [name] Referring [name]: Treating [name]/[name]:[name] Weeks in Treatment: 18 Visit Information History Since Last Visit Added or deleted any medications: No Patient Arrived: [place] Any new allergies or adverse reactions: No Arrival Time: 14:00 Had a fall or experienced change in No Accompanied By: self activities of daily living that may affect Transfer Assistance: None risk of falls: Patient Identification Verified: Yes Signs or symptoms of abuse/neglect since last No Secondary Verification Process Yes visito Completed: Hospitalized since last visit: No Patient Requires Transmission- No Implantable device outside of the clinic excluding No Based Precautions: cellular tissue based products placed in the center Patient Has Alerts: Yes since last visit: Patient Alerts: Right ABI .62 TBI Pain Present Now: No refused Left ABI .60 TBI .14 Electronic Signature(s) Signed: 11/18/2019 5:09:01 PM By: [name] EMT/HBOT Entered By: [name] on 11/18/2019 14:03:01 -------------------------------------------------------------------------------- Encounter Discharge Information Details Patient Name: Date of Service: [name] 11/18/2019 2:00 PM Medical Record Number:[id number] Patient Account Number: [id number] Date of Birth/Sex: Treating RN: 06-30-1941 (79 y.o. [name] Primary Care [name]: [name] Other Clinician: Mikeal [name] Referring Antionetta Ator: Treating Debborah Alonge/[name]:[name] Weeks in Treatment: 20 Encounter Discharge  Information Items Discharge Condition: Stable Ambulatory Status: Walker Discharge Destination: Home Transportation: Private Auto Accompanied By: self Schedule Follow-up Appointment: Yes Clinical Summary of Care: Patient Declined Electronic Signature(s) Signed: 11/18/2019 5:09:01 PM By: [name] EMT/HBOT Entered By: [name] on 11/18/2019 16:49:53 -------------------------------------------------------------------------------- Patient/Caregiver Education Details Patient Name: Date of Service: [name], [name] H. 2/24/2021andnbsp2:00 PM Medical Record Patient Account Number: [id number] Number: Treating RN: [name] Date of Birth/Gender: 06-23-1941 (78 y.o. F) Other Clinician: Mikeal [name] Primary Care Physician: [name] Treating [name] Referring Physician: Physician/[name]: [name] in Treatment: 29 Education Assessment Education Provided To: Patient Education Topics Provided Hyperbaric Oxygenation: Methods: Explain/Verbal Responses: State content correctly Electronic Signature(s) Signed: 11/18/2019 5:09:01 PM By: [name] EMT/HBOT Entered By: [name] on 11/18/2019 16:49:41 -------------------------------------------------------------------------------- Vitals Details Patient Name: Date of Service: [name] 11/18/2019 2:00 PM Medical Record Number:[id number] Patient Account Number: [id number] Date of Birth/Sex: Treating RN: [birth year] (79 y.o. [name], [name] Primary Care [name]: [name] Other Clinician: Mikeal [name] Referring [name]: Treating [name]/[name]:[name] Weeks in Treatment: 46 Vital Signs Time Taken: 14:05 Temperature (F): 97.2 Height (in): 64 Pulse (bpm): 72 Weight (lbs): 142 Respiratory Rate (breaths/min): 13 Body Mass Index (BMI): 24.4 Blood Pressure (mmHg): 134/40 Capillary Blood Glucose (mg/dl): 131 Reference  Range: 80 - 120 mg / dl Electronic Signature(s) Signed: 11/18/2019 5:09:01 PM By: [name] EMT/HBOT Entered By: [name] on 11/18/2019 14:03:23

## 2019-11-18 NOTE — Progress Notes (Addendum)
[name], [name] ([id number]) Visit Report for 11/18/2019 HBO Details Patient Name: Date of Service: [name], [name] 11/18/2019 2:00 PM Medical Record Number:[id number] Patient Account Number: [id number] Date of Birth/Sex: Treating RN: 1940/10/06 (79 y.o. [name] Primary Care Ercie Eliasen: [name] Other Clinician: [name] Referring Hyland Mollenkopf: Treating Kysen Wetherington/[name]:Stone III, Alen Bleacher, [name] HBO Treatment Course Details Treatment Course Number: 1 Ordering Siya Flurry: [name] Total Treatments Ordered: 40 HBO Treatment Start Date: 10/12/2019 HBO Indication: Diabetic Ulcer(s) of the Lower Extremity HBO Treatment Details Treatment Number: 27 Patient Type: Outpatient Chamber Type: Monoplace Chamber Serial #: [id number] Treatment Protocol: 2.5 ATA with 90 minutes oxygen, with two 5 minute air breaks Treatment Details Compression Rate Down: 2.0 psi / minute De-Compression Rate Up: 2.0 psi / minute Air breaks and CompressTx Pressure breathing periods DecompressDecompress Begins Reached (leave unused spaces Begins Ends blank) Chamber Pressure (ATA)1 2.5 2.5 2.5 2.5 2.5 --2.5 1 Clock Time (24 hr) 14:18 14:30 15:0015:0515:3515:40--16:10 16:22 Treatment Length: 124 (minutes) Treatment Segments: 4 Vital Signs Capillary Blood Glucose Reference Range: 80 - 120 mg / dl HBO Diabetic Blood Glucose Intervention Range: <131 mg/dl or >249 mg/dl Time Vitals Blood Respiratory Capillary Blood Glucose Pulse Action Type: Pulse: Temperature: Taken: Pressure: Rate: Glucose (mg/dl): Meter #: Oximetry (%) Taken: Pre 14:05 134/40 72 13 97.2 131 Post 16:25 145/49 64 15 98.6 144 Treatment Response Treatment Toleration: Well Treatment Completion Treatment Completed without Adverse Event Status: Additional Procedure Documentation Tissue Sevierity: Necrosis of bone Physician HBO Attestation: I certify that I supervised this HBO treatment  in accordance with Medicare guidelines. A trained Yes Yes emergency response team is readily available per hospital policies and procedures. Continue HBOT as ordered. Yes Electronic Signature(s) Signed: 11/18/2019 5:54:51 PM By: Worthy Keeler PA-C Previous Signature: 11/18/2019 5:09:01 PM Version By: [name] EMT/HBOT Entered By: Worthy Keeler on 11/18/2019 17:54:51 -------------------------------------------------------------------------------- HBO Safety Checklist Details Patient Name: Date of Service: [name], [name] 11/18/2019 2:00 PM Medical Record Number:[id number] Patient Account Number: [id number] Date of Birth/Sex: Treating RN: 18-Apr-1941 (79 y.o. [name] Primary Care Chris Narasimhan: [name] Other Clinician: [name] Referring Kalliopi Coupland: Treating Carmelo Reidel/[name]:Stone III, Alen Bleacher, [name] HBO Safety Checklist Items Safety Checklist Consent Form Signed Patient voided / foley secured and emptied When did you last eato 1200 - turkey/rice/beans Last dose of injectable or oral agent metformin NA Ostomy pouch emptied and vented if applicable All implantable devices assessed, documented and approved Intravenous access site secured and place PICC line left arm Valuables secured Linens and cotton and cotton/polyester blend (less than 51% polyester) Personal oil-based products / skin lotions / body lotions removed Wigs or hairpieces removed NA Smoking or tobacco materials removed Books / newspapers / magazines / loose paper removed Cologne, aftershave, perfume and deodorant removed Jewelry removed (may wrap wedding band) Make-up removed Hair care products removed NA Battery operated devices (external) removed NA Heating patches and chemical warmers removed NA Titanium eyewear removed Nail polish cured greater than 10 hours NA Casting material cured greater than 10 hours NA Hearing aids removed NA Loose  dentures or partials removed NA Prosthetics have been removed Patient demonstrates correct use of air break device (if applicable) Patient concerns have been addressed Patient grounding bracelet on and cord attached to chamber Specifics for Inpatients (complete in addition to above) Medication sheet sent with patient Intravenous medications needed or due during therapy sent with patient Drainage tubes (e.g. nasogastric tube or chest tube secured and vented)  Endotracheal or Tracheotomy tube secured Cuff deflated of air and inflated with saline Airway suctioned Electronic Signature(s) Signed: 11/18/2019 2:04:19 PM By: [name] EMT/HBOT Entered By: [name] on 11/18/2019 14:04:18

## 2019-11-18 NOTE — Progress Notes (Signed)
[name], [name] ([id number]) Visit Report for 11/18/2019 Problem List Details Patient Name: Date of Service: [name], [name] 11/18/2019 2:00 PM Medical Record Number:[id number] Patient Account Number: [id number] Date of Birth/Sex: Treating RN: October 29, 1940 (79 y.o. 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 07/13/2019 No Yes with other specified severity L97.528 Non-pressure chronic ulcer of other part of left foot 07/13/2019 No Yes with other specified severity E11.42 Type 2 diabetes mellitus with diabetic polyneuropathy 07/13/2019 No Yes 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, 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 Facility Procedures The patient participates with Medicare or their insurance follows the Medicare Facility Guidelines: CPT4 Code Description Modifier Quantity 81103159 G0277-(Facility Use Only) HBOT, full body chamber, 65min 4 Physician Procedures CPT4 Code Description: 4585929 [id number] - WC PHYS HYPERBARIC OXYGEN THERAPY ICD-10 Diagnosis Description E11.621 Type 2 diabetes mellitus with foot ulcer Modifier: Quantity: 1 Electronic Signature(s) Signed: 11/18/2019 5:54:56 PM By: Worthy Keeler PA-C Previous Signature: 11/18/2019 5:09:01 PM Version By: [name] EMT/HBOT Entered By: Worthy Keeler on 11/18/2019 17:54:56

## 2019-11-19 ENCOUNTER — Encounter (HOSPITAL_BASED_OUTPATIENT_CLINIC_OR_DEPARTMENT_OTHER): Payer: Medicare Other | Admitting: Internal Medicine

## 2019-11-19 ENCOUNTER — Other Ambulatory Visit: Payer: Self-pay

## 2019-11-19 DIAGNOSIS — E11621 Type 2 diabetes mellitus with foot ulcer: Secondary | ICD-10-CM | POA: Diagnosis not present

## 2019-11-19 DIAGNOSIS — L97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 limited to breakdown of skin: Secondary | ICD-10-CM | POA: Diagnosis not present

## 2019-11-19 DIAGNOSIS — L97518 Non-pressure chronic ulcer of other part of right foot with other specified severity: Secondary | ICD-10-CM | POA: Diagnosis not present

## 2019-11-19 DIAGNOSIS — E1142 Type 2 diabetes mellitus with diabetic polyneuropathy: Secondary | ICD-10-CM | POA: Diagnosis not present

## 2019-11-19 DIAGNOSIS — E1152 Type 2 diabetes mellitus with diabetic peripheral angiopathy with gangrene: Secondary | ICD-10-CM | POA: Diagnosis not present

## 2019-11-19 DIAGNOSIS — L97514 Non-pressure chronic ulcer of other part of right foot with necrosis of bone: Secondary | ICD-10-CM | POA: Diagnosis not present

## 2019-11-19 DIAGNOSIS — L97528 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ity: Secondary | ICD-10-CM | POA: Diagnosis not present

## 2019-11-19 LAB — GLUCOSE, CAPILLARY
Glucose-Capillary: 157 mg/dL — ABNORMAL HIGH (ref 70–99)
Glucose-Capillary: 134 mg/dL — ABNORMAL HIGH (ref 70–99)

## 2019-11-19 NOTE — Progress Notes (Signed)
AMNAH, BREUER ([id number]) Visit Report for 10/26/2019 HBO Details Patient Name: Date of Service: TYNIA, WIERS 10/26/2019 2:00 PM Medical Record Number:[id number] Patient Account Number: [id number] Date of Birth/Sex: Treating RN: 1941/05/06 (79 y.o. [name], [name] Primary Care Ura Yingling: [name] Other Clinician: Referring Michaelle Bottomley: Treating Lezly Rumpf/[name]:[name], [name], [name] Weeks in Treatment: 15 HBO Treatment Course Details Treatment Course Number: 1 Ordering Naziah Weckerly: [name] Total Treatments Ordered: 40 HBO Treatment Start Date: 10/12/2019 HBO Indication: Diabetic Ulcer(s) of the Lower Extremity HBO Treatment Details Treatment Number: 11 Patient Type: Outpatient Chamber Type: Monoplace Chamber Serial #: [id number] Treatment Protocol: 2.5 ATA with 90 minutes oxygen, with two 5 minute air breaks Treatment Details Compression Rate Down: 2.5 psi / minute De-Compression Rate Up: 2.5 psi / minute Air breaks and CompressTx Pressure breathing periods DecompressDecompress Begins Reached (leave unused spaces Begins Ends blank) Chamber Pressure (ATA)1 2.5 2.5 2.5 2.5 2.5 --2.5 1 Clock Time (24 hr) 14:15 14:27 42:7062:3762:8315:17--61:60 16:19 Treatment Length: 124 (minutes) Treatment Segments: 4 Vital Signs Capillary Blood Glucose Reference Range: 80 - 120 mg / dl HBO Diabetic Blood Glucose Intervention Range: <131 mg/dl or >249 mg/dl Time Vitals Blood Respiratory Capillary Blood Glucose Pulse Action Type: Pulse: Temperature: Taken: Pressure: Rate: Glucose (mg/dl): Meter #: Oximetry (%) Taken: Pre 13:57 138/50 84 20 98 165 Post 16:20 162/57 79 16 97.8 117 Treatment Response Treatment Toleration: Well Treatment Completion Treatment Not Started Status: Reason: Patient Choice Additional Procedure Documentation Tissue Sevierity: Bone involvement without necrosis Fowler Antos Notes No concerns with treatment given Physician HBO  Attestation: I certify that I supervised this HBO treatment in accordance with Medicare guidelines. A trained Yes emergency response team is readily available per hospital policies and procedures. Continue HBOT as ordered. Yes Electronic Signature(s) Signed: 10/29/2019 5:50:02 PM By: [name] MD Entered By: [name] on 10/26/2019 17:59:27 -------------------------------------------------------------------------------- HBO Safety Checklist Details Patient Name: Date of Service: [name] 10/26/2019 2:00 PM Medical Record Number:[id number] Patient Account Number: [id number] Date of Birth/Sex: Treating RN: [birth year] (79 y.o. [name], [name] Primary Care Kerston Landeck: [name] Other Clinician: Referring Dinah Lupa: Treating Fard Borunda/[name]:[name], [name], [name] Weeks in Treatment: 15 HBO Safety Checklist Items Safety Checklist Consent Form Signed Patient voided / foley secured and emptied When did you last eato lunch- chicken biscuit Last dose of injectable or oral agent AM- breakfast time NA Ostomy pouch emptied and vented if applicable All implantable devices assessed, documented and approved Intravenous access site secured and place Valuables secured Linens and cotton and cotton/polyester blend (less than 51% polyester) NA Personal oil-based products / skin lotions / body lotions removed Wigs or hairpieces removed NA Smoking or tobacco materials removed NA Books / newspapers / magazines / loose paper removed NA Cologne, aftershave, perfume and deodorant removed NA Jewelry removed (may wrap wedding band) NA Make-up removed NA Hair care products removed NA Battery operated devices (external) removed NA Heating patches and chemical warmers removed Titanium eyewear removed NA Nail polish cured greater than 10 hours NA Casting material cured greater than 10 hours NA Hearing aids removed NA Loose dentures or partials removed NA  Prosthetics have been removed Patient demonstrates correct use of air break device (if applicable) Patient concerns have been addressed Patient grounding bracelet on and cord attached to chamber Specifics for Inpatients (complete in addition to above) Medication sheet sent with patient Intravenous medications needed or due during therapy sent with patient Drainage tubes (e.g. nasogastric tube or chest tube secured and vented) Endotracheal or Tracheotomy tube secured Cuff  deflated of air and inflated with saline Airway suctioned Electronic Signature(s) Signed: 11/19/2019 2:25:48 PM By: [name] RN, BSN Previous Signature: 10/26/2019 6:01:21 PM Version By: Deon Pilling Entered By: [name] on 10/27/2019 17:23:39

## 2019-11-19 NOTE — Progress Notes (Signed)
REBECCAH, IVINS ([id number]) Visit Report for 11/19/2019 SuperBill Details Patient Name: Date of Service: SHONTERIA, ABELN 11/19/2019 Medical Record Number:[id number] Patient Account Number: [id number] Date of Birth/Sex: Treating RN: 07/26/1941 (79 y.o. 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, 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 Facility Procedures The patient participates with Medicare or their insurance follows the Medicare Facility Guidelines CPT4 Code Description Modifier Quantity [id number] G0277-(Facility Use Only) HBOT, full body chamber, 53min 4 Physician Procedures CPT4 Code Description Modifier Quantity 1155208 02233 - WC PHYS HYPERBARIC OXYGEN THERAPY 1 ICD-10 Diagnosis Description E11.621 Type 2 diabetes mellitus with foot ulcer Electronic Signature(s) Signed: 11/19/2019 5:25:06 PM By: [name] EMT/HBOT Signed: 11/19/2019 5:41:09 PM By: [name] MD Entered By: [name] on 11/19/2019 17:21:04

## 2019-11-19 NOTE — Progress Notes (Signed)
SHIVALI, QUACKENBUSH ([id number]) Visit Report for 11/19/2019 Arrival Information Details Patient Name: Date of Service: MARJA, ADDERLEY 11/19/2019 2:00 PM Medical Record Number:[id number] Patient Account Number: [id number] Date of Birth/Sex: Treating RN: 04-29-1941 (79 y.o. [name] Primary Care Javares Kaufhold: [name] Other Clinician: [name] Referring Nesa Distel: Treating Akayla Brass/[name]:[name], [name], [name] Visit Information History Since Last Visit Added or deleted any medications: No Patient Arrived: [name]: No Arrival Time: 13:50 Had a fall or experienced change in No Accompanied By: self activities of daily living that may affect Transfer Assistance: None risk of falls: Patient Identification Verified: Yes Signs or symptoms of abuse/neglect since last No Secondary Verification Process Yes visito Completed: Hospitalized since last visit: No Patient Requires Transmission- No Implantable device outside of the clinic excluding No Based Precautions: cellular tissue based products placed in the center Patient Has Alerts: Yes since last visit: Patient Alerts: Right ABI .62 TBI Pain Present Now: No refused Left ABI .60 TBI .14 Electronic Signature(s) Signed: 11/19/2019 5:25:06 PM By: [name] EMT/HBOT Entered By: [name] on 11/19/2019 14:13:22 -------------------------------------------------------------------------------- Encounter Discharge Information Details Patient Name: Date of Service: [name] 11/19/2019 2:00 PM Medical Record Number:[id number] Patient Account Number: [id number] Date of Birth/Sex: Treating RN: [birth year] (79 y.o. [name] Primary Care Aja Whitehair: [name] Other Clinician: [name] Referring Terianne [name]: Treating Marty Uy/[name]:[name], [name], [name] in Treatment: 35 Encounter Discharge  Information Items Discharge Condition: Stable Ambulatory Status: Walker Discharge Destination: Home Transportation: Private Auto Accompanied By: self Schedule Follow-up Appointment: Yes Clinical Summary of Care: Patient Declined Electronic Signature(s) Signed: 11/19/2019 5:25:06 PM By: [name] EMT/HBOT Entered By: [name] on 11/19/2019 17:24:43 -------------------------------------------------------------------------------- Patient/Caregiver Education Details Patient Name: Date of Service: [name] 2/25/2021andnbsp2:00 PM Medical Record Patient Account Number: [id number] Number: Treating RN: Deon Pilling Date of Birth/Gender: 01-Sep-1941 (78 y.o. F) Other Clinician: [name] Primary Care Physician: Verna Czech Referring Physician: Physician/[name]: Darl Pikes in Treatment: 47 Education Assessment Education Provided To: Patient Education Topics Provided Hyperbaric Oxygenation: Methods: Explain/Verbal Responses: State content correctly Electronic Signature(s) Signed: 11/19/2019 5:25:06 PM By: [name] EMT/HBOT Entered By: [name] on 11/19/2019 17:21:17 -------------------------------------------------------------------------------- Vitals Details Patient Name: Date of Service: [name] 11/19/2019 2:00 PM Medical Record Number:[id number] Patient Account Number: [id number] Date of Birth/Sex: Treating RN: [birth year] (79 y.o. [name], [name] Primary Care [place] Pettengill: [name] Other Clinician: [name] Referring Quantavius Humm: Treating Kyilee Gregg/[name]:[name], [name], [name] Vital Signs Time Taken: 13:55 Temperature (F): 97.8 Height (in): 64 Pulse (bpm): 63 Weight (lbs): 142 Respiratory Rate (breaths/min): 14 Body Mass Index (BMI): 24.4 Blood Pressure (mmHg): 135/42 Capillary Blood Glucose (mg/dl): 157 Reference  Range: 80 - 120 mg / dl Electronic Signature(s) Signed: 11/19/2019 5:25:06 PM By: [name] EMT/HBOT Entered By: [name] on 11/19/2019 14:13:40

## 2019-11-19 NOTE — Progress Notes (Addendum)
[name], [name] ([id number]) Visit Report for 11/19/2019 HBO Details Patient Name: Date of Service: [name], [name] 11/19/2019 2:00 PM Medical Record Number:[id number] Patient Account Number: [id number] Date of Birth/Sex: Treating RN: [birth year] (79 y.o. [name], [name] Primary Care [name]: [name] Other Clinician: Mikeal [name] Referring [name]: Treating [name]/[name]:[name], [name] in Treatment: 18 HBO Treatment Course Details Treatment Course Number: 1 Ordering [name]: [name] Total Treatments Ordered: 40 HBO Treatment Start Date: 10/12/2019 HBO Indication: Diabetic Ulcer(s) of the Lower Extremity HBO Treatment Details Treatment Number: 28 Patient Type: Outpatient Chamber Type: Monoplace Chamber Serial #: [id number] Treatment Protocol: 2.5 ATA with 90 minutes oxygen, with two 5 minute air breaks Treatment Details Compression Rate Down: 2.0 psi / minute De-Compression Rate Up: 2.0 psi / minute Air breaks and CompressTx Pressure breathing periods DecompressDecompress Begins Reached (leave unused spaces Begins Ends blank) Chamber Pressure (ATA)1 2.5 2.5 2.5 2.5 2.5 --2.5 1 Clock Time (24 hr) 14:08 14:20 14:5014:5515:2515:30--16:00 16:12 Treatment Length: 124 (minutes) Treatment Segments: 4 Vital Signs Capillary Blood Glucose Reference Range: 80 - 120 mg / dl HBO Diabetic Blood Glucose Intervention Range: <131 mg/dl or >249 mg/dl Time Vitals Blood Respiratory Capillary Blood Glucose Pulse Action Type: Pulse: Temperature: Taken: Pressure: Rate: Glucose (mg/dl): Meter #: Oximetry (%) Taken: Pre 13:55 135/42 63 14 97.8 157 Post 16:15 156/52 60 15 98 134 Treatment Response Treatment Toleration: Well Treatment Completion Treatment Completed without Adverse Event Status: Additional Procedure Documentation Tissue Sevierity: Necrosis of bone Brittlyn Cloe Notes No concerns with treatment given Physician HBO Attestation: I  certify that I supervised this HBO treatment in accordance with Medicare guidelines. A trained Yes emergency response team is readily available per hospital policies and procedures. Continue HBOT as ordered. Yes Electronic Signature(s) Signed: 11/19/2019 5:41:09 PM By: [name] Ham MD Previous Signature: 11/19/2019 5:25:06 PM Version By: [name] EMT/HBOT Entered By: [name] on 11/19/2019 17:39:59 -------------------------------------------------------------------------------- HBO Safety Checklist Details Patient Name: Date of Service: [name] 11/19/2019 2:00 PM Medical Record Number:[id number] Patient Account Number: [id number] Date of Birth/Sex: Treating RN: Mar 08, 1941 (79 y.o. [name], [name] Primary Care [name]: [name] Other Clinician: Mikeal [name] Referring Navdeep Halt: Treating [name]/[name]:[name], [name] in Treatment: 18 HBO Safety Checklist Items Safety Checklist Consent Form Signed Patient voided / foley secured and emptied 1200 - rice - cream of chicken When did you last eato soup / green beans Last dose of injectable or oral agent metformin Ostomy pouch emptied and vented if applicable NA All implantable devices assessed, documented and approved Intravenous access site secured and place PICC line left arm Valuables secured Linens and cotton and cotton/polyester blend (less than 51% polyester) Personal oil-based products / skin lotions / body lotions removed Wigs or hairpieces removed Smoking or tobacco materials removed NA Books / newspapers / magazines / loose paper removed Cologne, aftershave, perfume and deodorant removed Jewelry removed (may wrap wedding band) Make-up removed Hair care products removed Battery operated devices (external) removed NA Heating patches and chemical warmers removed NA Titanium eyewear removed NA Nail polish cured greater than 10 hours Casting material  cured greater than 10 hours NA Hearing aids removed NA Loose dentures or partials removed NA Prosthetics have been removed NA Patient demonstrates correct use of air break device (if applicable) Patient concerns have been addressed Patient grounding bracelet on and cord attached to chamber Specifics for Inpatients (complete in addition to above) Medication sheet sent with patient Intravenous medications needed or due during therapy sent with patient Drainage  tubes (e.g. nasogastric tube or chest tube secured and vented) Endotracheal or Tracheotomy tube secured Cuff deflated of air and inflated with saline Airway suctioned Electronic Signature(s) Signed: 11/19/2019 2:14:55 PM By: [name] EMT/HBOT Entered By: [name] on 11/19/2019 14:14:54

## 2019-11-20 ENCOUNTER — Encounter (HOSPITAL_BASED_OUTPATIENT_CLINIC_OR_DEPARTMENT_OTHER): Payer: Medicare Other | Admitting: Internal Medicine

## 2019-11-20 DIAGNOSIS — E1142 Type 2 diabetes mellitus with diabetic polyneuropathy: Secondary | ICD-10-CM | POA: Diagnosis not present

## 2019-11-20 DIAGNOSIS — L97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 limited to breakdown of skin: Secondary | ICD-10-CM | POA: Diagnosis not present

## 2019-11-20 DIAGNOSIS — T8789 Other complications of amputation stump: Secondary | ICD-10-CM | POA: Diagnosis not present

## 2019-11-20 DIAGNOSIS — E11621 Type 2 diabetes mellitus with foot ulcer: Secondary | ICD-10-CM | POA: Diagnosis not present

## 2019-11-20 DIAGNOSIS — E1152 Type 2 diabetes mellitus with diabetic peripheral angiopathy with gangrene: Secondary | ICD-10-CM | POA: Diagnosis not present

## 2019-11-20 DIAGNOSIS — S91105A Unspecified open wound of left lesser toe(s) without damage to nail, initial encounter: Secondary | ICD-10-CM | POA: Diagnosis not present

## 2019-11-20 DIAGNOSIS — L97528 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ity: Secondary | ICD-10-CM | POA: Diagnosis not present

## 2019-11-20 DIAGNOSIS — L97514 Non-pressure chronic ulcer of other part of right foot with necrosis of bone: Secondary | ICD-10-CM | POA: Diagnosis not present

## 2019-11-20 DIAGNOSIS — L97518 Non-pressure chronic ulcer of other part of right foot with other specified severity: Secondary | ICD-10-CM | POA: Diagnosis not present

## 2019-11-20 LAB — GLUCOSE, CAPILLARY: Glucose-Capillary: 118 mg/dL — ABNORMAL HIGH (ref 70–99)

## 2019-11-20 NOTE — Progress Notes (Signed)
DEDRIA, ENDRES ([id number]) Visit Report for 11/20/2019 Arrival Information Details Patient Name: Date of Service: [name], [name] 11/20/2019 2:00 PM Medical Record Number:[id number] Patient Account Number: [id number] Date of Birth/Sex: Treating RN: [birth year] (79 y.o. [name] Primary Care Ules Marsala: [name] Other Clinician: [name] Referring Yogesh Cominsky: Treating Loann Chahal/[name]:[name], [name], [name] Visit Information History Since Last Visit Added or deleted any medications: No Patient Arrived: [name] Any new allergies or adverse reactions: No Arrival Time: 13:58 Had a fall or experienced change in No Accompanied By: self activities of daily living that may affect Transfer Assistance: None risk of falls: Patient Identification Verified: Yes Signs or symptoms of abuse/neglect since last No Secondary Verification Process Yes visito Completed: Hospitalized since last visit: No Patient Requires Transmission- No Implantable device outside of the clinic excluding No Based Precautions: cellular tissue based products placed in the center Patient Has Alerts: Yes since last visit: Patient Alerts: Right ABI .62 TBI Pain Present Now: No refused Left ABI .60 TBI .14 Electronic Signature(s) Signed: 11/20/2019 5:12:28 PM By: [name] EMT/HBOT Entered By: [name] on 11/20/2019 14:16:18 -------------------------------------------------------------------------------- Encounter Discharge Information Details Patient Name: Date of Service: [name] 11/20/2019 2:00 PM Medical Record Number:[id number] Patient Account Number: [id number] Date of Birth/Sex: Treating RN: [birth year] (79 y.o. [name] Primary Care Robertson Colclough: [name] Other Clinician: [name] Referring Tiaira Arambula: Treating Kedra Mcglade/[name]:[name], [name], [name] in Treatment: 18 Encounter  Discharge Information Items Discharge Condition: Stable Ambulatory Status: Walker Discharge Destination: Home Transportation: Private Auto Accompanied By: self Schedule Follow-up Appointment: Yes Clinical Summary of Care: Patient Declined Electronic Signature(s) Signed: 11/20/2019 5:12:28 PM By: [name] EMT/HBOT Entered By: [name] on 11/20/2019 16:30:02 -------------------------------------------------------------------------------- Patient/Caregiver Education Details Patient Name: Date of Service: [name] 2/26/2021andnbsp2:00 PM Medical Record Patient Account Number: [id number] Number: Treating RN: Kela Millin Date of Birth/Gender: [birth year] (79 y.o. F) Other Clinician: [name] Primary Care Physician: Verna Czech Referring Physician: Physician/[name]: Darl Pikes in Treatment: 56 Education Assessment Education Provided To: Patient Education Topics Provided Hyperbaric Oxygenation: Methods: Explain/Verbal Responses: State content correctly Electronic Signature(s) Signed: 11/20/2019 5:12:28 PM By: [name] EMT/HBOT Entered By: [name] on 11/20/2019 16:29:47 -------------------------------------------------------------------------------- Vitals Details Patient Name: Date of Service: [name] 11/20/2019 2:00 PM Medical Record Number:[id number] Patient Account Number: [id number] Date of Birth/Sex: Treating RN: [birth year] (79 y.o. [name] Primary Care Franki Alcaide: [name] Other Clinician: Referring Antwoine Zorn: Treating Bellarae Lizer/[name]:[name], [name], [name] Vital Signs Time Taken: 14:03 Temperature (F): 97.8 Height (in): 64 Pulse (bpm): 77 Weight (lbs): 142 Respiratory Rate (breaths/min): 17 Body Mass Index (BMI): 24.4 Blood Pressure (mmHg): 119/60 Capillary Blood Glucose (mg/dl): 120 Reference  Range: 80 - 120 mg / dl Electronic Signature(s) Signed: 11/20/2019 5:12:28 PM By: [name] EMT/HBOT Entered By: [name] on 11/20/2019 14:16:53

## 2019-11-20 NOTE — Progress Notes (Addendum)
[name], [name] ([id number]) Visit Report for 11/20/2019 HBO Details Patient Name: Date of Service: [name], [name] 11/20/2019 2:00 PM Medical Record Number:[id number] Patient Account Number: [id number] Date of Birth/Sex: Treating RN: 07/04/1941 (79 y.o. [name], Shannon Primary Care Tosha Belgarde: [name] Other Clinician: [name] Referring Vickki Igou: Treating Jaidan Stachnik/[name]:[name], [name], [name] HBO Treatment Course Details Treatment Course Number: 1 Ordering Iyonnah Ferrante: [name] Total Treatments Ordered: 40 HBO Treatment Start Date: 10/12/2019 HBO Indication: Diabetic Ulcer(s) of the Lower Extremity HBO Treatment Details Treatment Number: 29 Patient Type: Outpatient Chamber Type: Monoplace Chamber Serial #: [id number] Treatment Protocol: 2.5 ATA with 90 minutes oxygen, with two 5 minute air breaks Treatment Details Compression Rate Down: 2.0 psi / minute De-Compression Rate Up: 2.0 psi / minute Air breaks and CompressTx Pressure breathing periods DecompressDecompress Begins Reached (leave unused spaces Begins Ends blank) Chamber Pressure (ATA)1 2.5 2.5 2.5 2.5 2.5 --2.5 1 Clock Time (24 hr) 14:15 14:27 98:9211:9417:4081:44--81:85 16:19 Treatment Length: 124 (minutes) Treatment Segments: 4 Vital Signs Capillary Blood Glucose Reference Range: 80 - 120 mg / dl HBO Diabetic Blood Glucose Intervention Range: <131 mg/dl or >249 mg/dl Time Vitals Blood Respiratory Capillary Blood Glucose Pulse Action Type: Pulse: Temperature: Taken: Pressure: Rate: Glucose (mg/dl): Meter #: Oximetry (%) Taken: Pre 14:03 119/60 77 17 97.8 120 Post 16:21 169/46 64 15 98 144 Treatment Response Treatment Toleration: Well Treatment Completion Treatment Completed without Adverse Event Status: Additional Procedure Documentation Tissue Sevierity: Necrosis of bone [name] Notes No concerns with treatment given. The patient was also seen  for wound care evaluation Physician HBO Attestation: I certify that I supervised this HBO treatment in accordance with Medicare guidelines. A trained Yes emergency response team is readily available per hospital policies and procedures. Continue HBOT as ordered. Yes Electronic Signature(s) Signed: 11/20/2019 5:26:28 PM By: [name] MD Previous Signature: 11/20/2019 5:12:28 PM Version By: [name] EMT/HBOT Entered By: [name] on 11/20/2019 17:25:34 -------------------------------------------------------------------------------- HBO Safety Checklist Details Patient Name: Date of Service: [name] 11/20/2019 2:00 PM Medical Record Number:[id number] Patient Account Number: [id number] Date of Birth/Sex: Treating RN: Aug 17, 1941 (79 y.o. [name], Shannon Primary Care [name]: [name] Other Clinician: [name] Referring Shontelle Muska: Treating Taquana Bartley/[name]:[name], [name], [name] HBO Safety Checklist Items Safety Checklist Consent Form Signed Patient voided / foley secured and emptied When did you last eato 1200 - rice soup - fruit - yogurt Last dose of injectable or oral agent metformin NA Ostomy pouch emptied and vented if applicable All implantable devices assessed, documented and approved Intravenous access site secured and place PICC line left arm Valuables secured Linens and cotton and cotton/polyester blend (less than 51% polyester) Personal oil-based products / skin lotions / body lotions removed Wigs or hairpieces removed NA Smoking or tobacco materials removed Books / newspapers / magazines / loose paper removed Cologne, aftershave, perfume and deodorant removed Jewelry removed (may wrap wedding band) Make-up removed Hair care products removed NA Battery operated devices (external) removed NA Heating patches and chemical warmers removed NA Titanium eyewear removed Nail polish cured  greater than 10 hours NA Casting material cured greater than 10 hours NA Hearing aids removed NA Loose dentures or partials removed NA Prosthetics have been removed Patient demonstrates correct use of air break device (if applicable) Patient concerns have been addressed Patient grounding bracelet on and cord attached to chamber Specifics for Inpatients (complete in addition to above) Medication sheet sent with patient Intravenous medications needed or due  during therapy sent with patient Drainage tubes (e.g. nasogastric tube or chest tube secured and vented) Endotracheal or Tracheotomy tube secured Cuff deflated of air and inflated with saline Airway suctioned Electronic Signature(s) Signed: 11/20/2019 2:17:52 PM By: [name] EMT/HBOT Entered By: [name] on 11/20/2019 14:17:51

## 2019-11-20 NOTE — Progress Notes (Signed)
[name], [name] ([id number]) Visit Report for 11/20/2019 HPI Details Patient Name: Date of Service: [name], [name] 11/20/2019 4:00 PM Medical Record Number:[id number] Patient Account Number: [id number] Date of Birth/Sex: Treating RN: [birth year] (79 y.o. [name] Primary Care Provider: [name] Other Clinician: Referring Provider: Treating Provider/[name]:Nissa Stannard, [name], [name] Weeks in Treatment: 58 History of Present Illness HPI Description: ADMISSION 07/13/2019 Patient is a 79 year old type II diabetic. She has known PAD. She has been followed by Dr. Jacqualyn Posey of podiatry for blistering areas on her toes dating back to 04/30/2019 which at this was the left fourth toe. By 8/11 she had wounds on the right and left second toes. She underwent arterial studies by Dr. Alvester Chou on 7/31 that showed ABIs on the right of 0.60 TBI of 0.26 on the left ABI of 0.56 and a TBI of 0.25. By 9/10 she had ischemic-looking wounds per Dr. Earleen Newport on the right first, left first second and third. She has been using Medihoney and then mupirocin more recently simply Betadine. The patient underwent angiography by Dr. Gwenlyn Found on 9/21. 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 Noted that there was 1 vessel runoff in the foot via the peroneal. On the left there was an 80% left SFA, 70% mid left SFA. There was a short segment calcified CTO distal less than SFA and above-knee popliteal artery reconstituting with two-vessel runoff. 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 It was felt that she had bilateral SFA disease as well as tibial vessel disease. An attempt was made to revascularize the left SFA but they were unable to cross because of the highly calcified nature of the lesion. 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 She has an area on the medial left fourth toe with raised horned callus on top of this. I am not certain what this represents. With regards to pain she has about a 2-1/2 I will claudication tolerance in the grocery store. She has some pain in night which is relieved by putting her feet down over the side of the bed. She is wearing Nitro-Dur patches on the top of her right foot. Past medical history includes type 2 diabetes with secondary PAD, neoplasm of the skin, diabetic retinopathy, carotid artery stenosis, hyperlipidemia hypertension. 11/2; the last time the patient was here I spoke to Dr. Gwenlyn Found about revascularization options on the right. As I understand things currently Dr. Gwenlyn Found spoke with Dr. Trula Slade and ultimately the patient was taken to surgery on 10/27. She had a right iliofemoral endarterectomy with a bovine patch angioplasty. I think the plan now is for her to have a staged intervention on the right SFA by Dr. Alvester Chou. Per the patient's understanding Dr. Gwenlyn Found and Dr. Jacqualyn Posey are waiting to see when the gangrenous toes on the right foot can be amputated. The patient states her pain is a lot better and she is grateful for that. 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 Small area on the left fourth toe. She is using Betadine to the mummified areas on the right and Medihoney on the left 08/27/2019; the last time I saw this patient I discharged her from the clinic. She had been revascularized by Dr. Trula Slade and she had  a right iliofemoral endarterectomy with a bovine angioplasty. 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 Jacqualyn Posey of podiatry on 08/07/2019. I note that she also had a intervention by Dr. Gwenlyn Found and he performed a directional atherectomy and drug-coated balloon angioplasty of the SFA and popliteal artery on the right. I  am not certain of the date of Dr. Kennon Holter procedure as of the time of this dictation. She was referred back to [place] by Dr. Earleen Newport predominantly for follow-up of the left fourth toe. She still has sutures and stitches in the right TMA site. She states her pain is a lot better. She expresses concern about the condition of the amputation site at the TMA. She is on doxycycline I think prescribed by Dr. Earleen Newport. She is complaining of some pain at night 12/10; I spoke to Dr. Jacqualyn Posey last week. He removed the sutures on the right foot on Monday of this week. She has the area on the left fourth toe just proximal to the PIP and then the right TMA site. She is still on doxycycline and has enough through next week. Unfortunately the TMA site does not look good at all. Both on the lateral and medial part of the incisions are areas that probe to bone. There is purulence over the medial part which I have cultured. We will use silver alginate. Left fourth toe looks somewhat better but there was still exposed bone 12/17; patient has an MRI booked for 12/30. Culture I did last week showed Pseudomonas Serratia and Enterococcus. This was purulent drainage coming out of the medial part of her amputation site. I use cefdinir 300 twice daily for 10 days that started on 12/15. Her x-ray on the right showed limited evaluation for osteomyelitis. The findings could have been postoperative. There was subtle erosion in the distal first and distal fifth metatarsal. An MRI was suggested. On the left she had irregularity of the fourth middle and proximal phalanx consistent with a history of osteomyelitis. I do not know that she has a history of osteomyelitis in this area. She had a newly defined area on the plantar third toe 12/31; patient's MRI is listed below: MRI OF THE RIGHT FOREFOOT WITHOUT AND WITH CONTRAST TECHNIQUE: Multiplanar, multisequence MR imaging of the right forefoot was performed before and after the  administration of intravenous contrast. CONTRAST: 6 mL GADAVIST IV SOLN COMPARISON: Plain films right foot 09/04/2019. FINDINGS: Bones/Joint/Cartilage The patient is status post transmetatarsal amputation as seen on the prior exam. Marrow edema and enhancement are seen in all of the distal metatarsals. In the first metatarsal, signal change extends 1.5 cm proximal to the stump and in the second metatarsal extends approximately 2 cm proximal to the stump. Edema and enhancement are seen in only the distal 0.7 cm of the third metatarsal stump and tips of the fourth and fifth metatarsals. Bone marrow signal is otherwise unremarkable. A small focus of subchondral edema is seen in the lateral talus, likely degenerative. Ligaments Intact. Muscles and Tendons No intramuscular fluid collection. Soft tissues Skin ulceration is seen off the stump of the first metatarsal. A thin fluid collection tracks deep to the wound and over the anterior metatarsals worrisome for small abscess. Intense subcutaneous edema and enhancement are seen diffusely. 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 Cellulitis about the foot. Skin ulceration over the distal first metatarsal is  identified with a thin fluid collection tracking anteriorly along the stump worrisome for abscess. Small focus of subchondral edema in the lateral talus is likely degenerative. Electronically Signed By: Inge Rise M.D. On: 09/23/2019 15:25 Patient arrives in clinic today not complaining any of any pain. 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 She does not describe claudication however her activity is very limited. 10/01/2019; since the patient was last here I have communicated with Dr. Gwenlyn Found after bypass by  Dr. Trula Slade and addressing the right superficial femoral artery he states that she is widely patent through peroneal arteries to the ankle with collaterals to the dorsalis pedis. He states he is going to talk to colleagues about the feasibility of tibial pedal access. The patient seems infectious disease later this afternoon Dr. Baxter Flattery. in preparation for this she has been off antibiotics for 1 week and I went ahead and obtain pieces of the remanence of her first metatarsal for pathology and CandS. The patient is a candidate for hyperbaric oxygen. 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 1/15; considerable improvement in the wounds on her feet. We are using silver collagen. She follows with Dr. Baxter Flattery of infectious disease and is on meropenem and daptomycin. She has been taught how to do this herself at home. Follows with Dr. Graylon Good at the end of treatment here. She has 2 wounds on the surgical TMA site 1 lateral and 1 medial the lateral 1 has regressed tremendously. The area medially still has some exposed bone although the base of this looks healthy. 1/22; 2 separate wounds on the surgical TMA site. Both of these look satisfactory. The medial area does not have any exposed bone. This is an improvement On the left side fourth toe dorsally over the proximal phalanx there is a deep punched out area that probes to bone. She has an area on the tip of the left third toe. She also tells [place] that in HBO she traumatized her left Achilles and this is left her with a superficial wound area 1/28; weekly visit along with HBO. She has 5 wounds. To our punched out areas on the original TMA site on the right. Both of these appear to have contracted. The area on the right no longer has exposed bone. She has an area on the tip of the left third toe and on the DIP of the left fourth toe. Both of these had surface callus that I removed and unfortunately they have  small areas that both go to bone. She has a traumatic wound on the left Achilles area that happened in HBO and that appears better. She is tolerating her IV antibiotics well at home. She has home health changing the dressings and she is doing it once on the weekends. We have been using silver collagen. She has been extensively revascularized on the right by Dr. Trula Slade and subsequently by Dr. Gwenlyn Found. 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 Baxter Flattery of infectious disease on 2/17. Dr. Gwenlyn Found on 2/23 and according to her her antibiotics stopped on 2/23. She is tolerating hyperbarics well. She has made a tremendous improvement in the right forefoot with only 2 smaller open wounds. Using silver collagen. On the left foot she has the area on the  tip of the left third toe and the medial part of the left fourth toe. These had exposed bone last week I did not sense any of that today 2/12; sees Dr. Graylon Good on 2/17 and Dr. Gwenlyn Found on 2/23. We are going to use Dermagraft on this today however the lateral part of her train TMA incision on the right is healed and the medial part is down so much that we just continue with the silver collagen She has wounds on the tip of her left third and the medial aspect of the left fourth. Both of these still have exposed bone I have not been able to get these to epithelialize. 2/19; she sees Dr. Gwenlyn Found on 2/23 and I have communicated with him about the left vascular supply. Looking at her angiogram from 08/03/2019 it looks as though that they could not cross the left CFA. Noted that she had one-vessel runoff bilaterally. She also apparently saw Dr. Graylon Good although I did not look up these results for preparation of this record. 2/26; Dr. Gwenlyn Found is going to do an angiogram next week on Wednesday. I think they are going to try to go at this  both retrograde and anterograde to see if anything can be done to revascularize the left lower limb. She continues to make nice progress on the remaining wound on the right medial foot on her TMA site and the toe it wounds on the left are responding nicely as well Electronic Signature(s) Signed: 11/20/2019 5:26:28 PM By: [name] MD Entered By: [name] on 11/20/2019 17:21:39 -------------------------------------------------------------------------------- Physical Exam Details Patient Name: Date of Service: [name] 11/20/2019 4:00 PM Medical Record Number:[id number] Patient Account Number: [id number] Date of Birth/Sex: Treating RN: 04-Dec-1940 (79 y.o. [name] Primary Care Provider: [name] Other Clinician: Referring Provider: Treating Provider/[name]:Laloni Rowton, [name], [name] Weeks in Treatment: 56 Constitutional Sitting or standing Blood Pressure is within target range for patient.. Pulse regular and within target range for patient.Marland Kitchen Respirations regular, non-labored and within target range.. Temperature is normal and within the target range for the patient.Marland Kitchen Appears in no distress. Notes Wound exam; the right TMA she only has a small area medially this looks a bit dry however we are using silver collagen and hydrogel but there is no probing depth here no undermining. At one time this was a gaping wound across the entire surgical site that probes to bone. She has an area on the tip of the left third toe and on the medial part of the left fourth toe these appear to be closing as well Electronic Signature(s) Signed: 11/20/2019 5:26:28 PM By: [name] MD Entered By: [name] on 11/20/2019 17:22:28 -------------------------------------------------------------------------------- Physician Orders Details Patient Name: Date of Service: [name] 11/20/2019 4:00 PM Medical Record Number:[id number] Patient Account  Number: [id number] Date of Birth/Sex: Treating RN: 1941/05/26 (79 y.o. 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 - after HBO Dressing Change Frequency Change Dressing every other day. - home health to change twice a week. wound center weekly. Wound Cleansing Clean wound with Wound Cleanser Primary Wound Dressing Wound #1 Left,Dorsal Toe Fourth Silver Collagen - moisten with hydrogel. Wound #7 Right Amputation Site - Transmetatarsal Silver Collagen - moisten with hydrogel. Wound #8 Left Toe Third Silver Collagen - moisten with hydrogel Secondary Dressing Wound #1 Left,Dorsal Toe Fourth Kerlix/Rolled Gauze Wound #7 Right Amputation Site - Transmetatarsal Kerlix/Rolled Gauze ABD pad Wound #8 Left Toe Third Kerlix/Rolled Gauze Dry Gauze Edema Control Avoid standing for long periods of time Elevate legs to the level of the heart or above for 30 minutes daily and/or when sitting, a frequency of: - throughout the day. Off-Loading DH Walker Boot to: - right foot Open toe surgical shoe to: - felt in surgical shoe for 3rd toe. wound center to provide patient with shoe today. [place] skilled nursing for wound care. - [facility] twice a week. Electronic Signature(s) Signed: 11/20/2019 5:17:01 PM By: Kela Millin Signed: 11/20/2019 5:26:28 PM By: [name] MD Entered By: Kela Millin on 11/20/2019 16:58:01 -------------------------------------------------------------------------------- Problem List Details Patient Name: Date of Service: [name] 11/20/2019 4:00 PM Medical Record Number:[id number] Patient Account Number: [id number] Date of Birth/Sex: Treating RN: 1940-12-26 (79 y.o. [name] Primary Care Provider: [name] Other Clinician: Referring Provider: Treating Provider/[name]:Baylie Drakes, [name], [name] Weeks in Treatment: 76 Active Problems ICD-10 Evaluated Encounter Code Description Active Date Today Diagnosis E11.621 Type 2 diabetes mellitus with foot ulcer 07/13/2019 No Yes E11.52 Type 2 diabetes mellitus with diabetic peripheral 07/13/2019 No Yes angiopathy with gangrene L97.518 Non-pressure chronic ulcer of other part of right foot 07/13/2019 No Yes with other specified severity L97.528 Non-pressure chronic ulcer of other part of left foot 07/13/2019 No Yes with other specified severity E11.42 Type 2 diabetes mellitus with diabetic polyneuropathy 07/13/2019 No Yes 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, right ankle and foot 10/01/2019 No Yes L97.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 10/16/2019 No Yes limited to breakdown of skin Inactive Problems Resolved Problems Electronic Signature(s) Signed: 11/20/2019 5:26:28 PM By: [name] MD Previous Signature: 11/20/2019 5:17:01 PM Version By: Kela Millin Entered By: [name] on 11/20/2019 17:20:10 -------------------------------------------------------------------------------- Progress Note Details Patient Name: Date of Service: [name] 11/20/2019 4:00 PM Medical  Record Number:[id number] Patient Account Number: [id number] Date of Birth/Sex: Treating RN: 10-24-1940 (79 y.o. [name] Primary Care Provider: [name] Other Clinician: Referring Provider: Treating Provider/[name]:Kandis Henry, [name], [name] Subjective History of Present Illness (HPI) ADMISSION 07/13/2019 Patient is a 79 year old type II diabetic. She has known PAD. She has been followed by Dr. Jacqualyn Posey of podiatry for blistering areas on her toes dating back to 04/30/2019 which at this was the left fourth toe. By 8/11 she had wounds on the right and left second toes. She underwent arterial studies by Dr. Alvester Chou on 7/31 that showed ABIs on the right of 0.60 TBI of 0.26 on the left ABI of 0.56 and a TBI of 0.25. By 9/10 she had ischemic-looking wounds per Dr. Earleen Newport on the right first, left first second and third. She has been using Medihoney and then mupirocin more recently simply Betadine. The patient underwent angiography by Dr. Gwenlyn Found on 9/21. On the right this showed 90 to 95% calcified distal right common femoral artery stenosis and a 95% focal mid SFA stenosis followed by an  80% segmental stenosis. Noted that there was 1 vessel runoff in the foot via the peroneal. ooOn the left there was an 80% left SFA, 70% mid left SFA. There was a short segment calcified CTO distal less than SFA and above-knee popliteal artery reconstituting with two-vessel runoff. 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 It was felt that she had bilateral SFA disease as well as tibial vessel disease. An attempt was made to revascularize the left SFA but they were unable to cross because of the highly calcified nature of the lesion. 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 She has an area on the medial left fourth toe with raised horned  callus on top of this. I am not certain what this represents. With regards to pain she has about a 2-1/2 I will claudication tolerance in the grocery store. She has some pain in night which is relieved by putting her feet down over the side of the bed. She is wearing Nitro-Dur patches on the top of her right foot. Past medical history includes type 2 diabetes with secondary PAD, neoplasm of the skin, diabetic retinopathy, carotid artery stenosis, hyperlipidemia hypertension. 11/2; the last time the patient was here I spoke to Dr. Gwenlyn Found about revascularization options on the right. As I understand things currently Dr. Gwenlyn Found spoke with Dr. Trula Slade and ultimately the patient was taken to surgery on 10/27. She had a right iliofemoral endarterectomy with a bovine patch angioplasty. I think the plan now is for her to have a staged intervention on the right SFA by Dr. Alvester Chou. Per the patient's understanding Dr. Gwenlyn Found and Dr. Jacqualyn Posey are waiting to see when the gangrenous toes on the right foot can be amputated. The patient states her pain is a lot better and she is grateful for that. 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 Small area on the left fourth toe. She is using Betadine to the mummified areas on the right and Medihoney on the left 08/27/2019; the last time I saw this patient I discharged her from the clinic. She had been revascularized by Dr. Trula Slade and she had a right iliofemoral endarterectomy with a bovine angioplasty. 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 Jacqualyn Posey of podiatry on 08/07/2019. I note that she also had a intervention by Dr. Gwenlyn Found and he performed a directional atherectomy and drug-coated balloon angioplasty of the SFA and popliteal artery on the right. I am not certain of the date of Dr. Kennon Holter procedure as of the time of this dictation. She was referred back to [place] by Dr. Earleen Newport  predominantly for follow-up of the left fourth toe. She still has sutures and stitches in the right TMA site. She states her pain is a lot better. She expresses concern about the condition of the amputation site at the TMA. She is on doxycycline I think prescribed by Dr. Earleen Newport. She is complaining of some pain at night 12/10; I spoke to Dr. Jacqualyn Posey last week. He removed the sutures on the right foot on Monday of this week. She has the area on the left fourth toe just proximal to the PIP and then the right TMA site. She is still on doxycycline and has enough through next week. Unfortunately the TMA site does not look good at all. Both on the lateral  and medial part of the incisions are areas that probe to bone. There is purulence over the medial part which I have cultured. We will use silver alginate. Left fourth toe looks somewhat better but there was still exposed bone 12/17; patient has an MRI booked for 12/30. Culture I did last week showed Pseudomonas Serratia and Enterococcus. This was purulent drainage coming out of the medial part of her amputation site. I use cefdinir 300 twice daily for 10 days that started on 12/15. Her x-ray on the right showed limited evaluation for osteomyelitis. The findings could have been postoperative. There was subtle erosion in the distal first and distal fifth metatarsal. An MRI was suggested. On the left she had irregularity of the fourth middle and proximal phalanx consistent with a history of osteomyelitis. I do not know that she has a history of osteomyelitis in this area. She had a newly defined area on the plantar third toe 12/31; patient's MRI is listed below: MRI OF THE RIGHT FOREFOOT WITHOUT AND WITH CONTRAST TECHNIQUE: Multiplanar, multisequence MR imaging of the right forefoot was performed before and after the administration of intravenous contrast. CONTRAST: 6 mL GADAVIST IV SOLN COMPARISON: Plain films right foot  09/04/2019. FINDINGS: Bones/Joint/Cartilage The patient is status post transmetatarsal amputation as seen on the prior exam. Marrow edema and enhancement are seen in all of the distal metatarsals. In the first metatarsal, signal change extends 1.5 cm proximal to the stump and in the second metatarsal extends approximately 2 cm proximal to the stump. Edema and enhancement are seen in only the distal 0.7 cm of the third metatarsal stump and tips of the fourth and fifth metatarsals. Bone marrow signal is otherwise unremarkable. A small focus of subchondral edema is seen in the lateral talus, likely degenerative. Ligaments Intact. Muscles and Tendons No intramuscular fluid collection. Soft tissues Skin ulceration is seen off the stump of the first metatarsal. A thin fluid collection tracks deep to the wound and over the anterior metatarsals worrisome for small abscess. Intense subcutaneous edema and enhancement are seen diffusely. 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 Cellulitis about the foot. Skin ulceration over the distal first metatarsal is identified with a thin fluid collection tracking anteriorly along the stump worrisome for abscess. Small focus of subchondral edema in the lateral talus is likely degenerative. Electronically Signed By: Inge Rise M.D. On: 09/23/2019 15:25 Patient arrives in clinic today not complaining any of any pain. 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 She does not describe claudication however her activity is very limited. 10/01/2019; since the patient was last here I have communicated with Dr. Gwenlyn Found after bypass by Dr. Trula Slade and addressing the right superficial femoral artery he states that she is widely patent through  peroneal arteries to the ankle with collaterals to the dorsalis pedis. He states he is going to talk to colleagues about the feasibility of tibial pedal access. The patient seems infectious disease later this afternoon Dr. Baxter Flattery. in preparation for this she has been off antibiotics for 1 week and I went ahead and obtain pieces of the remanence of her first metatarsal for pathology and CandS. The patient is a candidate for hyperbaric oxygen. 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 1/15; considerable improvement in the wounds on her feet. We are  using silver collagen. She follows with Dr. Baxter Flattery of infectious disease and is on meropenem and daptomycin. She has been taught how to do this herself at home. Follows with Dr. Graylon Good at the end of treatment here. She has 2 wounds on the surgical TMA site 1 lateral and 1 medial the lateral 1 has regressed tremendously. The area medially still has some exposed bone although the base of this looks healthy. 1/22; 2 separate wounds on the surgical TMA site. Both of these look satisfactory. The medial area does not have any exposed bone. This is an improvement On the left side fourth toe dorsally over the proximal phalanx there is a deep punched out area that probes to bone. She has an area on the tip of the left third toe. She also tells [place] that in HBO she traumatized her left Achilles and this is left her with a superficial wound area 1/28; weekly visit along with HBO. She has 5 wounds. To our punched out areas on the original TMA site on the right. Both of these appear to have contracted. The area on the right no longer has exposed bone. She has an area on the tip of the left third toe and on the DIP of the left fourth toe. Both of these had surface callus that I removed and unfortunately they have small areas that both go to bone. She has a traumatic wound on the left Achilles area that happened in HBO and  that appears better. She is tolerating her IV antibiotics well at home. She has home health changing the dressings and she is doing it once on the weekends. We have been using silver collagen. She has been extensively revascularized on the right by Dr. Trula Slade and subsequently by Dr. Gwenlyn Found. 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 Baxter Flattery of infectious disease on 2/17. Dr. Gwenlyn Found on 2/23 and according to her her antibiotics stopped on 2/23. She is tolerating hyperbarics well. She has made a tremendous improvement in the right forefoot with only 2 smaller open wounds. Using silver collagen. On the left foot she has the area on the tip of the left third toe and the medial part of the left fourth toe. These had exposed bone last week I did not sense any of that today 2/12; sees Dr. Graylon Good on 2/17 and Dr. Gwenlyn Found on 2/23. We are going to use Dermagraft on this today however the lateral part of her train TMA incision on the right is healed and the medial part is down so much that we just continue with the silver collagen She has wounds on the tip of her left third and the medial aspect of the left fourth. Both of these still have exposed bone I have not been able to get these to epithelialize. 2/19; she sees Dr. Gwenlyn Found on 2/23 and I have communicated with him about the left vascular supply. Looking at her angiogram from 08/03/2019 it looks as though that they could not cross the left CFA. Noted that she had one-vessel runoff bilaterally. She also apparently saw Dr. Graylon Good although I did not look up these results for preparation of this record. 2/26; Dr. Gwenlyn Found is going to do an angiogram next week on Wednesday. I think they are going to try to go at this both retrograde and anterograde to see if anything can be  done to revascularize the left lower limb. She continues to make  nice progress on the remaining wound on the right medial foot on her TMA site and the toe it wounds on the left are responding nicely as well Objective Constitutional Sitting or standing Blood Pressure is within target range for patient.. Pulse regular and within target range for patient.Marland Kitchen Respirations regular, non-labored and within target range.. Temperature is normal and within the target range for the patient.Marland Kitchen Appears in no distress. Vitals Time Taken: 4:38 PM, Height: 64 in, Weight: 142 lbs, BMI: 24.4, Temperature: 97.8 F, Pulse: 77 bpm, Respiratory Rate: 17 breaths/min, Blood Pressure: 119/60 mmHg. General Notes: Wound exam; the right TMA she only has a small area medially this looks a bit dry however we are using silver collagen and hydrogel but there is no probing depth here no undermining. At one time this was a gaping wound across the entire surgical site that probes to bone. ooShe has an area on the tip of the left third toe and on the medial part of the left fourth toe these appear to be closing as well Integumentary (Hair, Skin) Wound #1 status is Open. Original cause of wound was Blister. The wound is located on the Left,Dorsal Toe Fourth. The wound measures 0.1cm length x 0.1cm width x 0.1cm depth; 0.008cm^2 area and 0.001cm^3 volume. There is bone and Fat Layer (Subcutaneous Tissue) Exposed exposed. There is no tunneling or undermining noted. There is a small amount of serosanguineous drainage noted. The wound margin is distinct with the outline attached to the wound base. There is small (1-33%) pink, pale granulation within the wound bed. There is a large (67-100%) amount of necrotic tissue within the wound bed including Adherent Slough. Wound #7 status is Open. Original cause of wound was Surgical Injury. 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 The wound measures 0.4cm length x 1.3cm width x 0.3cm depth; 0.408cm^2 area and 0.123cm^3 volume.  There is Fat Layer (Subcutaneous Tissue) Exposed exposed. There is no tunneling or undermining noted. There is a medium amount of serosanguineous drainage noted. The wound margin is well defined and not attached to the wound base. There is medium (34-66%) pink granulation within the wound bed. There is a medium (34-66%) amount of necrotic tissue within the wound bed including Adherent Slough. Wound #8 status is Open. Original cause of wound was Gradually Appeared. The wound is located on the Left Toe Third. The wound measures 0.2cm length x 0.2cm width x 0.2cm depth; 0.031cm^2 area and 0.006cm^3 volume. There is Fat Layer (Subcutaneous Tissue) Exposed exposed. There is no tunneling or undermining noted. There is a small amount of serosanguineous drainage noted. The wound margin is distinct with the outline attached to the wound base. There is small (1-33%) pink, pale granulation within the wound bed. There is a large (67-100%) amount of necrotic tissue within the wound bed including Adherent Slough. 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 - after HBO Dressing Change Frequency: Change Dressing every other day. - home health to change twice  a week. wound center weekly. Wound Cleansing: Clean wound with Wound Cleanser Primary Wound Dressing: Wound #1 Left,Dorsal Toe Fourth: Silver Collagen - moisten with hydrogel. Wound #7 Right Amputation Site - Transmetatarsal: Silver  Collagen - moisten with hydrogel. Wound #8 Left Toe Third: Silver Collagen - moisten with hydrogel Secondary Dressing: Wound #1 Left,Dorsal Toe Fourth: Kerlix/Rolled Gauze Wound #7 Right Amputation Site - Transmetatarsal: Kerlix/Rolled Gauze ABD pad Wound #8 Left Toe Third: Kerlix/Rolled Gauze Dry Gauze Edema Control: Avoid standing for long periods of time Elevate legs to the level of the heart or above for 30 minutes daily and/or when sitting, a frequency of: - throughout the day. Off-Loading: DH Walker Boot to: - right foot Open toe surgical shoe to: - felt in surgical shoe for 3rd toe. wound center to provide patient with shoe today. Home Health: [facility] - [facility] twice a week. 1. The bit the patient has made remarkable progress with antibiotics and hyperbarics. 2. The wound on the right TMA continues to make nice progress. Even the wounds on her left third and fourth toes look remarkably better 3. There is going to be an attempt to revascularize the left foot next Wednesday by Dr. Gwenlyn Found which according the patient will look at anterior grade and retrograde approaches. She will missed hyperbarics Monday Tuesday and Wednesday which is unfortunate Electronic Signature(s) Signed: 11/20/2019 5:26:28 PM By: [name] MD Entered By: [name] on 11/20/2019 17:23:35 -------------------------------------------------------------------------------- SuperBill Details Patient Name: Date of Service: [name] 11/20/2019 Medical Record Number:[id number] Patient Account Number: [id number] Date of Birth/Sex: Treating RN: 02-01-1941 (79 y.o. [name] Primary Care Provider: [name] Other Clinician: Referring Provider: Treating Provider/[name]:Winson Eichorn, [name], [name] Diagnosis Coding ICD-10 Codes Code Description E11.621 Type 2 diabetes mellitus with foot  ulcer E11.52 Type 2 diabetes mellitus with diabetic peripheral angiopathy with gangrene L97.518 Non-pressure chronic ulcer of other part of right foot with other specified severity L97.528 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ity E11.42 Type 2 diabetes mellitus with diabetic polyneuropathy 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, 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 Facility Procedures The patient participates with Medicare or their insurance follows the Medicare Facility Guidelines: CPT4 Code Description Modifier Quantity [id number] [phone] - WOUND CARE VISIT-LEV 4 EST PT 1 Electronic Signature(s) Signed: 11/20/2019 5:26:28 PM By: [name] MD Previous Signature: 11/20/2019 5:17:01 PM Version By: Kela Millin Entered By: [name] on 11/20/2019 17:23:45

## 2019-11-20 NOTE — Progress Notes (Signed)
[name], [name] ([id number]) Visit Report for 11/20/2019 SuperBill Details Patient Name: Date of Service: [name], [name] 11/20/2019 Medical Record Number:[id number] Patient Account Number: [id number] Date of Birth/Sex: Treating RN: [birth year] (79 y.o. [name] Primary Care Provider: [name] Other Clinician: [name] Referring Provider: Treating Provider/[name]:Meygan Kyser, [name], [name] Diagnosis Coding ICD-10 Codes Code Description E11.621 Type 2 diabetes mellitus with foot ulcer E11.52 Type 2 diabetes mellitus with diabetic peripheral angiopathy with gangrene L97.518 Non-pressure chronic ulcer of other part of right foot with other specified severity L97.528 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ity E11.42 Type 2 diabetes mellitus with diabetic polyneuropathy 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, 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 Facility Procedures The patient participates with Medicare or their insurance follows the Medicare Facility Guidelines CPT4 Code Description Modifier Quantity [id number] G0277-(Facility Use Only) HBOT, full body chamber, 65min 4 Physician Procedures CPT4 Code Description Modifier Quantity [id number] - WC PHYS HYPERBARIC OXYGEN THERAPY 1 ICD-10 Diagnosis Description E11.621 Type 2 diabetes mellitus with foot ulcer Electronic Signature(s) Signed: 11/20/2019 5:12:28 PM By: [name] EMT/HBOT Signed: 11/20/2019 5:26:28 PM By: [name] MD Entered By: [name] on 11/20/2019 16:29:34

## 2019-11-21 ENCOUNTER — Other Ambulatory Visit (HOSPITAL_COMMUNITY): Payer: Medicare Other

## 2019-11-22 ENCOUNTER — Other Ambulatory Visit: Payer: Self-pay | Admitting: Endocrinology

## 2019-11-22 ENCOUNTER — Other Ambulatory Visit: Payer: Self-pay | Admitting: Internal Medicine

## 2019-11-23 ENCOUNTER — Other Ambulatory Visit (HOSPITAL_COMMUNITY)
Admission: RE | Admit: 2019-11-23 | Discharge: 2019-11-23 | Disposition: A | Discharge status: Home / Self Care | Payer: Medicare Other | Source: Ambulatory Visit | Attending: Cardiovascular Disease | Admitting: Cardiovascular Disease

## 2019-11-23 ENCOUNTER — Encounter (HOSPITAL_BASED_OUTPATIENT_CLINIC_OR_DEPARTMENT_OTHER): Payer: Medicare Other | Admitting: Internal Medicine

## 2019-11-23 DIAGNOSIS — E1152 Type 2 diabetes mellitus with diabetic peripheral angiopathy with gangrene: Secondary | ICD-10-CM | POA: Diagnosis not present

## 2019-11-23 DIAGNOSIS — L97528 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ity: Secondary | ICD-10-CM | POA: Diagnosis not present

## 2019-11-23 DIAGNOSIS — M869 Osteomyelitis, unspecified: Secondary | ICD-10-CM | POA: Diagnosis not present

## 2019-11-23 DIAGNOSIS — Z01812 Encounter for preprocedural laboratory examination: Secondary | ICD-10-CM | POA: Diagnosis not present

## 2019-11-23 DIAGNOSIS — I10 Essential (primary) hypertension: Secondary | ICD-10-CM | POA: Diagnosis not present

## 2019-11-23 DIAGNOSIS — Z20822 Contact with and (suspected) exposure to covid-19: Secondary | ICD-10-CM | POA: Diagnosis not present

## 2019-11-23 DIAGNOSIS — D649 Anemia, unspecified: Secondary | ICD-10-CM | POA: Diagnosis not present

## 2019-11-23 DIAGNOSIS — E11621 Type 2 diabetes mellitus with foot ulcer: Secondary | ICD-10-CM | POA: Diagnosis not present

## 2019-11-23 DIAGNOSIS — Z4781 Encounter for orthopedic aftercare following surgical amputation: Secondary | ICD-10-CM | POA: Diagnosis not present

## 2019-11-23 LAB — GLUCOSE, CAPILLARY: Glucose-Capillary: 144 mg/dL — ABNORMAL HIGH (ref 70–99)

## 2019-11-23 LAB — SARS CORONAVIRUS 2 (TAT 6-24 HRS): SARS Coronavirus 2: NEGATIVE

## 2019-11-24 ENCOUNTER — Telehealth: Payer: Self-pay | Admitting: *Deleted

## 2019-11-24 ENCOUNTER — Encounter (HOSPITAL_BASED_OUTPATIENT_CLINIC_OR_DEPARTMENT_OTHER): Payer: Medicare Other | Admitting: Internal Medicine

## 2019-11-24 NOTE — Progress Notes (Signed)
SALLY-ANN, CUTBIRTH ([id number]) Visit Report for 11/20/2019 Arrival Information Details Patient Name: Date of Service: [name], GRANDERSON 11/20/2019 4:00 PM Medical Record Number:[id number] Patient Account Number: [id number] Date of Birth/Sex: Treating RN: 1941/07/19 (79 y.o. [name] Primary Care Nathanal Hermiz: [name] Other Clinician: Referring Teylor Wolven: Treating Emanuel Dowson/[name]:[name], [name], [name] Visit Information History Since Last Visit All ordered tests and consults were completed: No Patient Arrived: [name] Added or deleted any medications: No Arrival Time: 16:37 Any new allergies or adverse reactions: No Accompanied By: self Had a fall or experienced change in No Transfer Assistance: None activities of daily living that may affect Patient Identification Verified: Yes risk of falls: Secondary Verification Process Yes Signs or symptoms of abuse/neglect since last No Completed: visito Patient Requires Transmission- No Hospitalized since last visit: No Based Precautions: Implantable device outside of the clinic excluding No Patient Has Alerts: Yes cellular tissue based products placed in the center Patient Alerts: Right ABI .62 TBI since last visit: refused Has Dressing in Place as Prescribed: Yes Left ABI .60 TBI Pain Present Now: No .14 Electronic Signature(s) Signed: 11/24/2019 9:14:02 AM By: Carlene Coria RN Entered By: Carlene Coria on 11/20/2019 16:38:01 -------------------------------------------------------------------------------- Clinic Level of Care Assessment Details Patient Name: Date of Service: [name], [name] 11/20/2019 4:00 PM Medical Record Number:[id number] Patient Account Number: [id number] Date of Birth/Sex: Treating RN: 04-19-1941 (79 y.o. [name] Primary Care Brenleigh Collet: [name] Other Clinician: Referring Anup Brigham: Treating Lealand Elting/[name]:[name], [name],  [name] Clinic Level of Care Assessment Items TOOL 4 Quantity Score X - Use when only an EandM is performed on FOLLOW-UP visit 1 0 ASSESSMENTS - Nursing Assessment / Reassessment X - Reassessment of Co-morbidities (includes updates in patient status) 1 10 X - Reassessment of Adherence to Treatment Plan 1 5 ASSESSMENTS - Wound and Skin Assessment / Reassessment []  - Simple Wound Assessment / Reassessment - one wound 0 X - Complex Wound Assessment / Reassessment - multiple wounds 3 5 []  - Dermatologic / Skin Assessment (not related to wound area) 0 ASSESSMENTS - Focused Assessment X - Circumferential Edema Measurements - multi extremities 1 5 []  - Nutritional Assessment / Counseling / Intervention 0 []  - Lower Extremity Assessment (monofilament, tuning fork, pulses) 0 []  - Peripheral Arterial Disease Assessment (using hand held doppler) 0 ASSESSMENTS - Ostomy and/or Continence Assessment and Care []  - Incontinence Assessment and Management 0 []  - Ostomy Care Assessment and Management (repouching, etc.) 0 PROCESS - Coordination of Care X - Simple Patient / Family Education for ongoing care 1 15 []  - Complex (extensive) Patient / Family Education for ongoing care 0 X - Staff obtains Programmer, systems, Records, Test Results / Process Orders 1 10 X - Staff telephones HHA, Nursing Homes / Clarify orders / etc 1 10 []  - Routine Transfer to another Facility (non-emergent condition) 0 []  - Routine Hospital Admission (non-emergent condition) 0 []  - New Admissions / Biomedical engineer / Ordering NPWT, Apligraf, etc. 0 []  - Emergency Hospital Admission (emergent condition) 0 X - Simple Discharge Coordination 1 10 []  - Complex (extensive) Discharge Coordination 0 PROCESS - Special Needs []  - Pediatric / Minor Patient Management 0 []  - Isolation Patient Management 0 []  - Hearing / Language / Visual special needs 0 []  - Assessment of Community assistance (transportation, D/C  planning, etc.) 0 []  - Additional assistance / Altered mentation 0 []  - Support Surface(s) Assessment (bed, cushion, seat, etc.) 0 INTERVENTIONS - Wound Cleansing / Measurement []  -  Simple Wound Cleansing - one wound 0 X - Complex Wound Cleansing - multiple wounds 3 5 X - Wound Imaging (photographs - any number of wounds) 1 5 []  - Wound Tracing (instead of photographs) 0 []  - Simple Wound Measurement - one wound 0 X - Complex Wound Measurement - multiple wounds 3 5 INTERVENTIONS - Wound Dressings X - Small Wound Dressing one or multiple wounds 3 10 []  - Medium Wound Dressing one or multiple wounds 0 []  - Large Wound Dressing one or multiple wounds 0 X - Application of Medications - topical 1 5 []  - Application of Medications - injection 0 INTERVENTIONS - Miscellaneous []  - External ear exam 0 []  - Specimen Collection (cultures, biopsies, blood, body fluids, etc.) 0 []  - Specimen(s) / Culture(s) sent or taken to Lab for analysis 0 []  - Patient Transfer (multiple staff / Civil Service fast streamer / Similar devices) 0 []  - Simple Staple / Suture removal (25 or less) 0 []  - Complex Staple / Suture removal (26 or more) 0 []  - Hypo / Hyperglycemic Management (close monitor of Blood Glucose) 0 []  - Ankle / Brachial Index (ABI) - do not check if billed separately 0 X - Vital Signs 1 5 Has the patient been seen at the hospital within the last three years: Yes Total Score: 155 Level Of Care: New/Established - Level 4 Electronic Signature(s) Signed: 11/20/2019 5:17:01 PM By: Kela Millin Entered By: Kela Millin on 11/20/2019 17:02:31 -------------------------------------------------------------------------------- Encounter Discharge Information Details Patient Name: Date of Service: [name] 11/20/2019 4:00 PM Medical Record Number:[id number] Patient Account Number: [id number] Date of Birth/Sex: Treating RN: 1941/04/19 (79 y.o. [name] Primary Care Shariece Viveiros: [name] Other Clinician: Referring Deshana Rominger: Treating Caylan Schifano/[name]:[name], [name], [name] in Treatment: 26 Encounter Discharge Information Items Discharge Condition: Stable Ambulatory Status: Walker Discharge Destination: Home Transportation: Private Auto Accompanied By: self Schedule Follow-up Appointment: Yes Clinical Summary of Care: Patient Declined Electronic Signature(s) Signed: 11/20/2019 5:01:55 PM By: Baruch Gouty RN, BSN Entered By: Baruch Gouty on 11/20/2019 17:01:21 -------------------------------------------------------------------------------- Lower Extremity Assessment Details Patient Name: Date of Service: [name], [name] 11/20/2019 4:00 PM Medical Record Number:[id number] Patient Account Number: [id number] Date of Birth/Sex: Treating RN: 1941-08-30 (79 y.o. [name] Primary Care Mekel Haverstock: [name] Other Clinician: Referring Shatika Grinnell: Treating Valgene Deloatch/[name]:[name], [name], [name] Edema Assessment Assessed: [Left: No] [Right: No] Edema: [Left: No] [Right: No] Calf Left: Right: Point of Measurement: 39 cm From Medial Instep 30 cm 29 cm Ankle Left: Right: Point of Measurement: 10 cm From Medial Instep 20 cm 19 cm Electronic Signature(s) Signed: 11/24/2019 9:14:02 AM By: Carlene Coria RN Entered By: Carlene Coria on 11/20/2019 16:44:42 -------------------------------------------------------------------------------- Multi Wound Chart Details Patient Name: Date of Service: [name] 11/20/2019 4:00 PM Medical Record Number:[id number] Patient Account Number: [id number] Date of Birth/Sex: Treating RN: 1941/01/28 (79 y.o. [name] Primary Care Haron Beilke: [name] Other Clinician: Referring Mykenzie Ebanks: Treating Ritik Stavola/[name]:[name], [name], [name] Vital Signs Height(in): 64 Pulse(bpm): 58 Weight(lbs): 142 Blood  Pressure(mmHg): 119/60 Body Mass Index(BMI): 24 Temperature(F): 97.8 Respiratory 17 Rate(breaths/min): Photos: [1:No Photos] [7:No Photos] [8:No Photos] Wound Location: [1:Left Toe Fourth - Dorsal] [7:Right Amputation Site - Transmetatarsal] [8:Left Toe Third] Wounding Event: [1:Blister] [7:Surgical Injury] [8:Gradually Appeared] 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] [7:Lower Extremity] [8:Lower Extremity] Secondary Etiology: [1:Arterial Insufficiency Ulcer N/A] [8:N/A] Comorbid History: [1:Deep Vein Thrombosis, Hypertension, Peripheral Hypertension, Peripheral Hypertension, Peripheral  Arterial Disease, Type II Diabetes] [7:Deep Vein Thrombosis, Arterial Disease, Type II Arterial Disease, Type II Diabetes] [8:Deep Vein  Thrombosis, Diabetes] Date Acquired: [1:09/16/2018] [7:08/07/2019] [8:09/10/2019] Weeks of Treatment: [1:18] [7:12] [8:10] Wound Status: [1:Open] [7:Open] [8:Open] Measurements L x W x D 0.1x0.1x0.1 [7:0.4x1.3x0.3] [8:0.2x0.2x0.2] (cm) Area (cm) : [1:0.008] [7:0.408] [8:0.031] Volume (cm) : [1:0.001] [7:0.123] [8:0.006] % Reduction in Area: [1:98.90%] [7:96.10%] [8:93.00%] % Reduction in Volume: 98.60% [7:88.10%] [8:86.40%] Classification: [1:Grade 2] [7:Grade 4] [8:Grade 2] Exudate Amount: [1:Small] [7:Medium] [8:Small] Exudate Type: [1:Serosanguineous] [7:Serosanguineous] [8:Serosanguineous] Exudate Color: [1:red, brown] [7:red, brown] [8:red, brown] Wound Margin: [1:Distinct, outline attached Well defined, not attached Distinct, outline attached] Granulation Amount: [1:Small (1-33%)] [7:Medium (34-66%)] [8:Small (1-33%)] Granulation Quality: [1:Pink, Pale] [7:Pink] [8:Pink, Pale] Necrotic Amount: [1:Large (67-100%)] [7:Medium (34-66%)] [8:Large (67-100%)] Exposed Structures: [1:Fat Layer (Subcutaneous Fat Layer (Subcutaneous Fat Layer (Subcutaneous Tissue) Exposed: Yes Bone: Yes  Fascia: No Tendon: No Muscle: No Joint: No Medium (34-66%)] [7:Tissue) Exposed: Yes Fascia: No Tendon: No Muscle: No Joint: No Bone: No Small  (1-33%)] [8:Tissue) Exposed: Yes Fascia: No Tendon: No Muscle: No Joint: No Bone: No Small (1-33%)] Treatment Notes Wound #1 (Left, Dorsal Toe Fourth) 3. Primary Dressing Applied Collegen AG 4. Secondary Dressing Dry Gauze Roll Gauze 5. Secured With Tape 7. 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 Primary Dressing Applied Collegen [employer] 4. Secondary Dressing Dry Gauze Roll Gauze 5. Secured With Tape 7. Footwear/Offloading device applied Removable Cast Walker/Walking Boot Surgical shoe Wound #8 (Left Toe Third) 3. Primary Dressing Applied Collegen [employer] 4. Secondary Dressing Dry Gauze Roll Gauze 5. Secured With Tape 7. Footwear/Offloading device applied Removable Cast Armed forces training and education officer) Signed: 11/20/2019 5:26:28 PM By: [name] MD Signed: 11/23/2019 5:21:05 PM By: Kela Millin Entered By: [name] on 11/20/2019 17:20:18 -------------------------------------------------------------------------------- Multi-Disciplinary Care Plan Details Patient Name: Date of Service: DARNEISHA, WINDHORST 11/20/2019 4:00 PM Medical Record Number:[id number] Patient Account Number: [id number] Date of Birth/Sex: Treating RN: 1940/12/25 (79 y.o. 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 Potential for barotraumas to ears, sinuses, teeth, and lungs or cerebral gas embolism related to changes in atmospheric pressure inside hyperbaric oxygen  chamber Goals: Barotrauma will be prevented during HBO2 Date Initiated: 10/01/2019 Target Resolution Date: 12/04/2019 Goal Status: Active Patient and/or family will be able to state/discuss factors appropriate to the management of their disease process during treatment Date Initiated: 10/01/2019 Target Resolution Date: 12/04/2019 Goal Status: Active Patient/caregiver will verbalize understanding of HBO goals, rationale, procedures and potential hazards Date Initiated: 10/01/2019 Date Inactivated: 10/22/2019 Target Resolution Date: 10/30/2019 Goal Status: Met Interventions: Assess and provide for patients comfort related to the hyperbaric environment and equalization of middle ear Assess patient for any history of confinement anxiety Notes: Abuse / Safety / Falls / Self Care Management Nursing Diagnoses: Potential for falls Goals: Patient will remain injury free related to falls Date Initiated: 08/27/2019 Target Resolution Date: 12/04/2019 Goal Status: Active Interventions: Provide education on fall prevention Notes: Pain, Acute or Chronic Nursing Diagnoses: Pain, acute or chronic: actual or potential Potential alteration in comfort, pain Goals: Patient will verbalize adequate pain control and receive pain control interventions during procedures as needed Date Initiated: 08/27/2019 Target Resolution Date: 12/04/2019 Goal Status: Active Interventions: Encourage patient to take pain medications as prescribed  Provide education on pain management Reposition patient for comfort Notes: Electronic Signature(s) Signed: 11/20/2019 5:17:01 PM By: Kela Millin Entered By: Kela Millin on 11/20/2019 16:53:43 -------------------------------------------------------------------------------- Pain Assessment Details Patient Name: Date of Service: [name], [name] 11/20/2019 4:00 PM Medical Record Number:[id number] Patient Account Number: [id number] Date of Birth/Sex: Treating  RN: Sep 19, 1941 (79 y.o. [name] Primary Care Avilene Marrin: [name] Other Clinician: Referring Bayli Quesinberry: Treating Caitrin Pendergraph/[name]:[name], [name], [name] Weeks in Treatment: 78 Active Problems Location of Pain Severity and Description of Pain Patient Has Paino No Site Locations Pain Management and Medication Current Pain Management: Electronic Signature(s) Signed: 11/24/2019 9:14:02 AM By: Carlene Coria RN Entered By: Carlene Coria on 11/20/2019 16:38:29 -------------------------------------------------------------------------------- Patient/Caregiver Education Details Patient Name: Date of Service: [place], Fallan H. 2/26/2021andnbsp4:00 PM Medical Record Number:[id number] Patient Account Number: [id number] Date of Birth/Gender: Treating RN: 1940-12-20 (79 y.o. [name] Primary Care Physician: [name] Other Clinician: Referring Physician: Treating Physician/[name]:[name], [name], [name] in Treatment: 59 Education Assessment Education Provided To: Patient Education Topics Provided Pain: Methods: Explain/Verbal Responses: State content correctly Safety: Methods: Explain/Verbal Responses: State content correctly Electronic Signature(s) Signed: 11/20/2019 5:17:01 PM By: Kela Millin Entered By: Kela Millin on 11/20/2019 16:53:59 -------------------------------------------------------------------------------- Wound Assessment Details Patient Name: Date of Service: [name] 11/20/2019 4:00 PM Medical Record Number:[id number] Patient Account Number: [id number] Date of Birth/Sex: Treating RN: 06-05-1941 (79 y.o. [name] Primary Care Niah Heinle: [name] Other Clinician: Referring Larkin Alfred: Treating Sherlene Rickel/[name]:[name], [name], [name] Wound Status Wound Number: 1 Primary Diabetic Wound/Ulcer of the Lower Extremity Etiology: Wound  Location: Left Toe Fourth - Dorsal Secondary Arterial Insufficiency Ulcer Wounding Event: Blister Etiology: Date Acquired: 09/16/2018 Wound Open Weeks Of Treatment: 18 Status: Clustered Wound: No Comorbid Deep Vein Thrombosis, Hypertension, History: Peripheral Arterial Disease, Type II Diabetes Wound Measurements Length: (cm) 0.1 % Reduct Width: (cm) 0.1 % Reduct Depth: (cm) 0.1 Epitheli Area: (cm) 0.008 Tunneli Volume: (cm) 0.001 Undermi Wound Description Classification: Grade 2 Wound Margin: Distinct, outline attached Exudate Amount: Small Exudate Type: Serosanguineous Exudate Color: red, brown Wound Bed Granulation Amount: Small (1-33%) Granulation Quality: Pink, Pale Necrotic Amount: Large (67-100%) Necrotic Quality: Adherent Slough Foul Odor After Cleansing: No Slough/Fibrino No Exposed Structure Fascia Exposed: No Fat Layer (Subcutaneous Tissue) Exposed: Yes Tendon Exposed: No Muscle Exposed: No Joint Exposed: No Bone Exposed: Yes ion in Area: 98.9% ion in Volume: 98.6% alization: Medium (34-66%) ng: No ning: No Treatment Notes Wound #1 (Left, Dorsal Toe Fourth) 3. Primary Dressing Applied Collegen AG 4. Secondary Dressing Dry Gauze Roll Gauze 5. Secured With Tape 7. Footwear/Offloading device applied Removable Cast Armed forces training and education officer) Signed: 11/24/2019 9:14:02 AM By: Carlene Coria RN Entered By: Carlene Coria on 11/20/2019 16:48:12 -------------------------------------------------------------------------------- Wound Assessment Details Patient Name: Date of Service: JAYLIANNA, [name] 11/20/2019 4:00 PM Medical Record Number:[id number] Patient Account Number: [id number] Date of Birth/Sex: Treating RN: Apr 26, 1941 (79 y.o. [name] Primary Care Murl Zogg: [name] Other Clinician: Referring Aunisty Reali: Treating Severa Jeremiah/[name]:[name], [name], [name] Wound  Status Wound Number: 7 Primary Diabetic Wound/Ulcer of the Lower Extremity Etiology: Wound Location: Right Amputation Site - Transmetatarsal Wound Open Status: Status: Wounding Event: Surgical Injury Comorbid Deep Vein Thrombosis, Hypertension, Date Acquired: 08/07/2019 History: Peripheral Arterial Disease, Type II Diabetes Weeks Of Treatment: 12 Clustered Wound: No Wound Measurements Length: (cm) 0.4 Width: (cm) 1.3 Depth: (cm) 0.3 Area: (cm) 0.408 Volume: (cm) 0.123 Wound Description Classification: Grade 4 Wound Margin: Well defined, not  attached Exudate Amount: Medium Exudate Type: Serosanguineous Exudate Color: red, brown Wound Bed Granulation Amount: Medium (34-66%) Granulation Quality: Pink Necrotic Amount: Medium (34-66%) Necrotic Quality: Adherent Slough After Cleansing: No brino Yes Exposed Structure osed: No (Subcutaneous Tissue) Exposed: Yes osed: No osed: No sed: No ed: No % Reduction in Area: 96.1% % Reduction in Volume: 88.1% Epithelialization: Small (1-33%) Tunneling: No Undermining: No Foul Odor Slough/Fi Fascia Exp Fat Layer Tendon Exp Muscle Exp Joint Expo Bone Expos Treatment Notes 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 Primary Dressing Applied Collegen AG 4. Secondary Dressing Dry Gauze Roll Gauze 5. Secured With Tape 7. Footwear/Offloading device applied Removable Cast Armed forces training and education officer) Signed: 11/24/2019 9:14:02 AM By: Carlene Coria RN Entered By: Carlene Coria on 11/20/2019 16:48:18 -------------------------------------------------------------------------------- Wound Assessment Details Patient Name: Date of Service: [name], [name] 11/20/2019 4:00 PM Medical Record Number:[id number] Patient Account Number: [id number] Date of Birth/Sex: Treating RN: 16-Mar-1941 (79 y.o. [name] Primary Care Davona Kinoshita: [name] Other Clinician: Referring Imanni Burdine:  Treating Maninder Deboer/[name]:[name], [name], [name] Wound Status Wound Number: 8 Primary Diabetic Wound/Ulcer of the Lower Extremity Etiology: Wound Location: Left Toe Third Wound Open Wounding Event: Gradually Appeared Status: Date Acquired: 09/10/2019 Comorbid Deep Vein Thrombosis, Hypertension, Weeks Of Treatment: 10 History: Peripheral Arterial Disease, Type II Diabetes Clustered Wound: No Wound Measurements Length: (cm) 0.2 Width: (cm) 0.2 Depth: (cm) 0.2 Area: (cm) 0.031 Volume: (cm) 0.006 Wound Description Classification: Grade 2 Wound Margin: Distinct, outline attached Exudate Amount: Small Exudate Type: Serosanguineous Exudate Color: red, brown Wound Bed Granulation Amount: Small (1-33%) Granulation Quality: Pink, Pale Necrotic Amount: Large (67-100%) Necrotic Quality: Adherent Slough r After Cleansing: No ibrino Yes Exposed Structure posed: No (Subcutaneous Tissue) Exposed: Yes posed: No posed: No osed: No sed: No % Reduction in Area: 93% % Reduction in Volume: 86.4% Epithelialization: Small (1-33%) Tunneling: No Undermining: No Foul Odo Slough/F Fascia Ex Fat Layer Tendon Ex Muscle Ex Joint Exp Bone Expo Treatment Notes Wound #8 (Left Toe Third) 3. Primary Dressing Applied Collegen AG 4. Secondary Dressing Dry Gauze Roll Gauze 5. Secured With Tape 7. Footwear/Offloading device applied Removable Cast Armed forces training and education officer) Signed: 11/24/2019 9:14:02 AM By: Carlene Coria RN Entered By: Carlene Coria on 11/20/2019 16:48:23 -------------------------------------------------------------------------------- Vitals Details Patient Name: Date of Service: [name] 11/20/2019 4:00 PM Medical Record Number:[id number] Patient Account Number: [id number] Date of Birth/Sex: Treating RN: [birth year] (79 y.o. [name] Primary Care Jonella Redditt: [name] Other  Clinician: Referring Byren Pankow: Treating Marilene Vath/[name]:[name], [name], [name] Weeks in Treatment: 86 Vital Signs Time Taken: 16:38 Temperature (F): 97.8 Height (in): 64 Pulse (bpm): 77 Weight (lbs): 142 Respiratory Rate (breaths/min): 17 Body Mass Index (BMI): 24.4 Blood Pressure (mmHg): 119/60 Reference Range: 80 - 120 mg / dl Electronic Signature(s) Signed: 11/24/2019 9:14:02 AM By: Carlene Coria RN Entered By: Carlene Coria on 11/20/2019 16:38:22

## 2019-11-24 NOTE — Telephone Encounter (Signed)
Pt contacted pre-abdominal aortogram  scheduled at [facility] for: Wednesday November 25, 2019 10:30 AM Verified arrival time and place: [facility]) at: 8:30 AM   No solid food after midnight prior to cath, clear liquids until 5 AM day of procedure. Contrast allergy: no  Hold: HCTZ-AM of procedure Metformin-day of procedure and 48 hours post procedure. Glipizide-AM of procedure. Invokana-AM of procedure.  Except hold medications AM meds can be  taken pre-cath with sip of water including: ASA 81 mg Plavix 75 mg  Confirmed patient has responsible adult to drive home post procedure and observe 24 hours after arriving home: yes  Currently, due to Covid-19 pandemic, only one person will be allowed with patient. Must be the same person for patient's entire stay and will be required to wear a mask. They will be asked to wait in the waiting room for the duration of the patient's stay.  Patients are required to wear a mask when they enter the hospital.      COVID-19 Pre-Screening Questions:  . In the past 7 to 10 days have you had a cough,  shortness of breath, headache, congestion, fever (100 or greater) body aches, chills, sore throat, or sudden loss of taste or sense of smell? no . Have you been around anyone with known Covid 19 in the past 7-10 days? no . Have you been around anyone who is awaiting Covid 19 test results in the past 7 to 10 days? no . Have you been around anyone who has been exposed to Covid 19, or has mentioned symptoms of Covid 19 within the past 7 to 10 days? no   I reviewed procedure/mask/visitor instructions, COVID-19 screening questions with patient, she verbalized understanding, thanked me for call.

## 2019-11-24 NOTE — Telephone Encounter (Addendum)
Pt has PICC and administers 2 antibiotics at home, Daptomycin in the evening at 10 PM and Merrem three times a day.  Pt will administer  Daptomycin at home this evening about 10 PM, she will administer Merrem tomorrow morning at 6 AM before she comes to the hospital.  Pt is going to bring 2 doses of Merrem (she would be due for a dose at 1 PM tomorrow) with her to the hospital. She said they can be stored at room temperature.  I asked her to let the nurses know that she has these with her when she arrives at hospital for procedure.  She is not going to bring Daptomycin with her, she said she would be due for a dose tomorrow about 10 PM, it requires refrigeration.   I will forward to Dr Fletcher Anon, Dr Gwenlyn Found, and Anderson Malta, RN at [facility] for their review.

## 2019-11-25 ENCOUNTER — Encounter (HOSPITAL_BASED_OUTPATIENT_CLINIC_OR_DEPARTMENT_OTHER): Payer: Medicare Other | Admitting: Physician Assistant

## 2019-11-25 ENCOUNTER — Ambulatory Visit (HOSPITAL_COMMUNITY)
Admission: RE | Admit: 2019-11-25 | Discharge: 2019-11-26 | Disposition: A | Discharge status: Home / Self Care | Payer: Medicare Other | Attending: Cardiovascular Disease | Admitting: Cardiovascular Disease

## 2019-11-25 ENCOUNTER — Encounter (HOSPITAL_COMMUNITY)
Admission: RE | Disposition: A | Discharge status: Home / Self Care | Payer: Medicare Other | Source: Home / Self Care | Attending: Cardiovascular Disease

## 2019-11-25 ENCOUNTER — Other Ambulatory Visit: Payer: Self-pay

## 2019-11-25 DIAGNOSIS — Z881 Allergy status to other antibiotic agents status: Secondary | ICD-10-CM | POA: Insufficient documentation

## 2019-11-25 DIAGNOSIS — Z7984 Long term (current) use of oral hypoglycemic drugs: Secondary | ICD-10-CM | POA: Diagnosis not present

## 2019-11-25 DIAGNOSIS — Z95820 Peripheral vascular angioplasty status with implants and grafts: Secondary | ICD-10-CM

## 2019-11-25 DIAGNOSIS — Z885 Allergy status to narcotic agent status: Secondary | ICD-10-CM | POA: Diagnosis not present

## 2019-11-25 DIAGNOSIS — I10 Essential (primary) hypertension: Secondary | ICD-10-CM | POA: Diagnosis not present

## 2019-11-25 DIAGNOSIS — I739 Peripheral vascular disease, unspecified: Secondary | ICD-10-CM | POA: Diagnosis present

## 2019-11-25 DIAGNOSIS — I70262 Atherosclerosis of native arteries of extremities with gangrene, left leg: Secondary | ICD-10-CM | POA: Diagnosis not present

## 2019-11-25 DIAGNOSIS — L97529 Non-pressure chronic ulcer of other part of left foot with unspecified severity: Secondary | ICD-10-CM | POA: Diagnosis not present

## 2019-11-25 DIAGNOSIS — E11621 Type 2 diabetes mellitus with foot ulcer: Secondary | ICD-10-CM | POA: Insufficient documentation

## 2019-11-25 DIAGNOSIS — E1152 Type 2 diabetes mellitus with diabetic peripheral angiopathy with gangrene: Secondary | ICD-10-CM | POA: Diagnosis not present

## 2019-11-25 DIAGNOSIS — I6523 Occlusion and stenosis of bilateral carotid arteries: Secondary | ICD-10-CM

## 2019-11-25 DIAGNOSIS — Z87891 Personal history of nicotine dependence: Secondary | ICD-10-CM | POA: Diagnosis not present

## 2019-11-25 DIAGNOSIS — Z7982 Long term (current) use of aspirin: Secondary | ICD-10-CM | POA: Insufficient documentation

## 2019-11-25 DIAGNOSIS — I70212 Atherosclerosis of native arteries of extremities with intermittent claudication, left leg: Secondary | ICD-10-CM | POA: Diagnosis not present

## 2019-11-25 DIAGNOSIS — Z7902 Long term (current) use of antithrombotics/antiplatelets: Secondary | ICD-10-CM | POA: Insufficient documentation

## 2019-11-25 DIAGNOSIS — E785 Hyperlipidemia, unspecified: Secondary | ICD-10-CM | POA: Diagnosis not present

## 2019-11-25 DIAGNOSIS — E1142 Type 2 diabetes mellitus with diabetic polyneuropathy: Secondary | ICD-10-CM | POA: Insufficient documentation

## 2019-11-25 DIAGNOSIS — I96 Gangrene, not elsewhere classified: Secondary | ICD-10-CM | POA: Diagnosis present

## 2019-11-25 DIAGNOSIS — Z888 Allergy status to other drugs, medicaments and biological substances status: Secondary | ICD-10-CM | POA: Diagnosis not present

## 2019-11-25 DIAGNOSIS — Z79899 Other long term (current) drug therapy: Secondary | ICD-10-CM | POA: Insufficient documentation

## 2019-11-25 DIAGNOSIS — I998 Other disorder of circulatory system: Secondary | ICD-10-CM

## 2019-11-25 DIAGNOSIS — I70229 Atherosclerosis of native arteries of extremities with rest pain, unspecified extremity: Secondary | ICD-10-CM

## 2019-11-25 HISTORY — PX: INTRAVASCULAR LITHOTRIPSY: CATH118324

## 2019-11-25 HISTORY — PX: ABDOMINAL AORTOGRAM W/LOWER EXTREMITY: CATH118223

## 2019-11-25 HISTORY — PX: ABDOMINAL AORTAGRAM: SHX5706

## 2019-11-25 HISTORY — PX: PERIPHERAL VASCULAR ATHERECTOMY: CATH118256

## 2019-11-25 LAB — POCT ACTIVATED CLOTTING TIME
Activated Clotting Time: 191 seconds
Activated Clotting Time: 246 seconds
Activated Clotting Time: 241 seconds
Activated Clotting Time: 241 seconds

## 2019-11-25 LAB — GLUCOSE, CAPILLARY
Glucose-Capillary: 123 mg/dL — ABNORMAL HIGH (ref 70–99)
Glucose-Capillary: 164 mg/dL — ABNORMAL HIGH (ref 70–99)

## 2019-11-25 SURGERY — ABDOMINAL AORTOGRAM W/LOWER EXTREMITY
Anesthesia: LOCAL

## 2019-11-25 MED ORDER — CHLORHEXIDINE GLUCONATE CLOTH 2 % EX PADS: 6.0000 | MEDICATED_PAD | Freq: Every day | CUTANEOUS | Status: DC

## 2019-11-25 MED ORDER — LIDOCAINE HCL (PF) 1 % IJ SOLN
INTRAMUSCULAR | Status: DC | PRN
  Administered 2019-11-25: 15 mL
  Administered 2019-11-25: 3 mL

## 2019-11-25 MED ORDER — HEPARIN (PORCINE) IN NACL 1000-0.9 UT/500ML-% IV SOLN
INTRAVENOUS | Status: DC | PRN
  Administered 2019-11-25: 500 mL

## 2019-11-25 MED ORDER — ONDANSETRON HCL 4 MG/2ML IJ SOLN: 4.0000 mg | Freq: Four times a day (QID) | INTRAMUSCULAR | Status: DC | PRN

## 2019-11-25 MED ORDER — VERAPAMIL HCL 2.5 MG/ML IV SOLN
INTRAVENOUS | Status: DC | PRN
  Administered 2019-11-25: 10 mL via INTRA_ARTERIAL

## 2019-11-25 MED ORDER — SODIUM CHLORIDE 0.9% FLUSH: 3.0000 mL | INTRAVENOUS | Status: DC | PRN

## 2019-11-25 MED ORDER — VERAPAMIL HCL 2.5 MG/ML IV SOLN
INTRAVENOUS | Status: AC
  Filled 2019-11-25: qty 2

## 2019-11-25 MED ORDER — DAPTOMYCIN 500 MG IV SOLR
500.0000 mg | Freq: Every evening | INTRAVENOUS | Status: DC
  Filled 2019-11-25: qty 10

## 2019-11-25 MED ORDER — SEMAGLUTIDE(0.25 OR 0.5MG/DOS) 2 MG/1.5ML ~~LOC~~ SOPN: 0.5000 mg | PEN_INJECTOR | SUBCUTANEOUS | Status: DC

## 2019-11-25 MED ORDER — SODIUM CHLORIDE 0.9 % WEIGHT BASED INFUSION
3.0000 mL/kg/h | INTRAVENOUS | Status: DC
  Administered 2019-11-25: 3 mL/kg/h via INTRAVENOUS

## 2019-11-25 MED ORDER — SODIUM CHLORIDE 0.9 % WEIGHT BASED INFUSION: 1.0000 mL/kg/h | INTRAVENOUS | Status: DC

## 2019-11-25 MED ORDER — HYDROCHLOROTHIAZIDE 25 MG PO TABS
25.0000 mg | ORAL_TABLET | Freq: Every day | ORAL | Status: DC
  Administered 2019-11-26: 25 mg via ORAL
  Filled 2019-11-25: qty 1

## 2019-11-25 MED ORDER — CANAGLIFLOZIN 100 MG PO TABS
100.0000 mg | ORAL_TABLET | Freq: Every day | ORAL | Status: DC
  Administered 2019-11-26: 100 mg via ORAL
  Filled 2019-11-25: qty 1

## 2019-11-25 MED ORDER — SODIUM CHLORIDE 0.9 % IV SOLN: 250.0000 mL | INTRAVENOUS | Status: DC | PRN

## 2019-11-25 MED ORDER — FENTANYL CITRATE (PF) 100 MCG/2ML IJ SOLN
INTRAMUSCULAR | Status: DC | PRN
  Administered 2019-11-25: 50 ug via INTRAVENOUS
  Administered 2019-11-25: 25 ug via INTRAVENOUS
  Administered 2019-11-25: 50 ug via INTRAVENOUS

## 2019-11-25 MED ORDER — METOPROLOL TARTRATE 100 MG PO TABS: 100.0000 mg | ORAL_TABLET | Freq: Two times a day (BID) | ORAL | Status: DC

## 2019-11-25 MED ORDER — HEPARIN SODIUM (PORCINE) 1000 UNIT/ML IJ SOLN
INTRAMUSCULAR | Status: AC
  Filled 2019-11-25: qty 1

## 2019-11-25 MED ORDER — SODIUM CHLORIDE 0.9 % IV SOLN: INTRAVENOUS | Status: AC

## 2019-11-25 MED ORDER — IRBESARTAN 300 MG PO TABS
300.0000 mg | ORAL_TABLET | Freq: Every day | ORAL | Status: DC
  Administered 2019-11-26: 300 mg via ORAL
  Filled 2019-11-25: qty 1

## 2019-11-25 MED ORDER — ATORVASTATIN CALCIUM 10 MG PO TABS
20.0000 mg | ORAL_TABLET | Freq: Every evening | ORAL | Status: DC
  Administered 2019-11-25: 18:00:00 20 mg via ORAL
  Filled 2019-11-25: qty 2

## 2019-11-25 MED ORDER — FENTANYL CITRATE (PF) 100 MCG/2ML IJ SOLN
INTRAMUSCULAR | Status: AC
  Filled 2019-11-25: qty 2

## 2019-11-25 MED ORDER — ACETAMINOPHEN 500 MG PO TABS
1000.0000 mg | ORAL_TABLET | Freq: Four times a day (QID) | ORAL | Status: DC | PRN
  Filled 2019-11-25 (×2): qty 2

## 2019-11-25 MED ORDER — MEROPENEM 1 G IV SOLR
1.0000 g | Freq: Three times a day (TID) | INTRAVENOUS | Status: DC
  Filled 2019-11-25 (×2): qty 1

## 2019-11-25 MED ORDER — LABETALOL HCL 5 MG/ML IV SOLN
INTRAVENOUS | Status: DC | PRN
  Administered 2019-11-25 (×2): 10 mg via INTRAVENOUS

## 2019-11-25 MED ORDER — METOPROLOL TARTRATE 100 MG PO TABS
100.0000 mg | ORAL_TABLET | Freq: Every day | ORAL | Status: DC
  Administered 2019-11-25: 100 mg via ORAL
  Filled 2019-11-25: qty 1

## 2019-11-25 MED ORDER — LIDOCAINE HCL (PF) 1 % IJ SOLN
INTRAMUSCULAR | Status: AC
  Filled 2019-11-25: qty 30

## 2019-11-25 MED ORDER — NITROGLYCERIN IN D5W 200-5 MCG/ML-% IV SOLN
INTRAVENOUS | Status: AC
  Filled 2019-11-25: qty 250

## 2019-11-25 MED ORDER — PANTOPRAZOLE SODIUM 40 MG PO TBEC
40.0000 mg | DELAYED_RELEASE_TABLET | Freq: Every day | ORAL | Status: DC
  Administered 2019-11-26: 40 mg via ORAL
  Filled 2019-11-25: qty 1

## 2019-11-25 MED ORDER — GLIPIZIDE ER 5 MG PO TB24
5.0000 mg | ORAL_TABLET | Freq: Every day | ORAL | Status: DC
  Administered 2019-11-26: 5 mg via ORAL
  Filled 2019-11-25: qty 1

## 2019-11-25 MED ORDER — HEPARIN SODIUM (PORCINE) 1000 UNIT/ML IJ SOLN
INTRAMUSCULAR | Status: DC | PRN
  Administered 2019-11-25: 4000 [IU] via INTRAVENOUS
  Administered 2019-11-25 (×3): 2000 [IU] via INTRAVENOUS

## 2019-11-25 MED ORDER — CHLORHEXIDINE GLUCONATE CLOTH 2 % EX PADS
6.0000 | MEDICATED_PAD | Freq: Every day | CUTANEOUS | Status: DC
  Administered 2019-11-26: 6 via TOPICAL

## 2019-11-25 MED ORDER — SODIUM CHLORIDE 0.9% FLUSH
3.0000 mL | Freq: Two times a day (BID) | INTRAVENOUS | Status: DC
  Administered 2019-11-26: 3 mL via INTRAVENOUS

## 2019-11-25 MED ORDER — ASCORBIC ACID 500 MG PO TABS
1000.0000 mg | ORAL_TABLET | Freq: Every day | ORAL | Status: DC
  Administered 2019-11-25: 1000 mg via ORAL
  Filled 2019-11-25: qty 2

## 2019-11-25 MED ORDER — CLOPIDOGREL BISULFATE 300 MG PO TABS
ORAL_TABLET | ORAL | Status: AC
  Filled 2019-11-25: qty 1

## 2019-11-25 MED ORDER — IODIXANOL 320 MG/ML IV SOLN
INTRAVENOUS | Status: DC | PRN
  Administered 2019-11-25: 175 mL via INTRA_ARTERIAL

## 2019-11-25 MED ORDER — NITROGLYCERIN 1 MG/10 ML FOR IR/CATH LAB
INTRA_ARTERIAL | Status: DC | PRN
  Administered 2019-11-25 (×6): 200 ug via INTRA_ARTERIAL

## 2019-11-25 MED ORDER — CALCIUM CARBONATE ANTACID 500 MG PO CHEW: 2.0000 | CHEWABLE_TABLET | Freq: Every day | ORAL | Status: DC | PRN

## 2019-11-25 MED ORDER — SODIUM CHLORIDE 0.9 % IV SOLN
1.0000 g | Freq: Three times a day (TID) | INTRAVENOUS | Status: DC
  Administered 2019-11-25 – 2019-11-26 (×2): 1 g via INTRAVENOUS
  Filled 2019-11-25 (×4): qty 1

## 2019-11-25 MED ORDER — VIPERSLIDE LUBRICANT OPTIME
TOPICAL | Status: DC | PRN
  Administered 2019-11-25: 200 mL via SURGICAL_CAVITY

## 2019-11-25 MED ORDER — CLOPIDOGREL BISULFATE 75 MG PO TABS
75.0000 mg | ORAL_TABLET | Freq: Every day | ORAL | Status: DC
  Administered 2019-11-26: 75 mg via ORAL
  Filled 2019-11-25: qty 1

## 2019-11-25 MED ORDER — SODIUM CHLORIDE 0.9% FLUSH
3.0000 mL | Freq: Two times a day (BID) | INTRAVENOUS | Status: DC
  Administered 2019-11-25: 3 mL via INTRAVENOUS

## 2019-11-25 MED ORDER — VITAMIN B-12 1000 MCG PO TABS
1000.0000 ug | ORAL_TABLET | Freq: Every day | ORAL | Status: DC
  Administered 2019-11-25: 1000 ug via ORAL
  Filled 2019-11-25: qty 1

## 2019-11-25 MED ORDER — METOPROLOL TARTRATE 50 MG PO TABS
50.0000 mg | ORAL_TABLET | Freq: Every day | ORAL | Status: DC
  Administered 2019-11-26: 50 mg via ORAL
  Filled 2019-11-25: qty 1

## 2019-11-25 MED ORDER — LABETALOL HCL 5 MG/ML IV SOLN
INTRAVENOUS | Status: AC
  Filled 2019-11-25: qty 4

## 2019-11-25 MED ORDER — ASPIRIN 81 MG PO CHEW: 81.0000 mg | CHEWABLE_TABLET | ORAL | Status: DC

## 2019-11-25 MED ORDER — CLOPIDOGREL BISULFATE 300 MG PO TABS
ORAL_TABLET | ORAL | Status: DC | PRN
  Administered 2019-11-25: 300 mg via ORAL

## 2019-11-25 MED ORDER — LABETALOL HCL 5 MG/ML IV SOLN: 10.0000 mg | INTRAVENOUS | Status: DC | PRN

## 2019-11-25 MED ORDER — HEPARIN (PORCINE) IN NACL 1000-0.9 UT/500ML-% IV SOLN
INTRAVENOUS | Status: AC
  Filled 2019-11-25: qty 1000

## 2019-11-25 MED ORDER — SODIUM CHLORIDE 0.9 % IV SOLN
500.0000 mg | Freq: Every day | INTRAVENOUS | Status: DC
  Administered 2019-11-25: 500 mg via INTRAVENOUS
  Filled 2019-11-25 (×2): qty 10

## 2019-11-25 MED ORDER — METFORMIN HCL 500 MG PO TABS
1000.0000 mg | ORAL_TABLET | Freq: Two times a day (BID) | ORAL | Status: DC
  Administered 2019-11-25: 1000 mg via ORAL
  Filled 2019-11-25 (×2): qty 2

## 2019-11-25 MED ORDER — SODIUM CHLORIDE 0.9 % IV SOLN
1.0000 g | Freq: Once | INTRAVENOUS | Status: DC
  Filled 2019-11-25 (×2): qty 1

## 2019-11-25 MED ORDER — ASPIRIN EC 81 MG PO TBEC: 81.0000 mg | DELAYED_RELEASE_TABLET | Freq: Every evening | ORAL | Status: DC

## 2019-11-25 MED ORDER — DILTIAZEM HCL ER COATED BEADS 120 MG PO CP24
240.0000 mg | ORAL_CAPSULE | Freq: Every day | ORAL | Status: DC
  Administered 2019-11-26: 240 mg via ORAL
  Filled 2019-11-25: qty 2

## 2019-11-25 SURGICAL SUPPLY — 44 items
BALLN ADMIRAL INPACT 5X250 (BALLOONS) ×3
BALLN STERLING OTW 3X100X150 (BALLOONS) ×3
BALLN STERLING OTW 5X60X135 (BALLOONS) ×3
BALLOON ADMIRAL INPACT 5X250 (BALLOONS) ×2 IMPLANT
BALLOON STERLING OTW 3X100X150 (BALLOONS) ×2 IMPLANT
BALLOON STERLING OTW 5X60X135 (BALLOONS) ×2 IMPLANT
BUR DBK EXCH CART 1.50 SOL145 (BURR) ×2 IMPLANT
BURR DBK EXCH CART 1.50 SOL145 (BURR) ×3
CATH ANGIO 5F BER2 65CM (CATHETERS) ×3 IMPLANT
CATH ANGIO 5F PIGTAIL 65CM (CATHETERS) ×3 IMPLANT
CATH CROSS OVER TEMPO 5F (CATHETERS) ×3 IMPLANT
CATH CXI 2.6F 65 ST (CATHETERS) ×3
CATH CXI SUPP 2.6F 150 ST (CATHETERS) ×3 IMPLANT
CATH QUICKCROSS ANG SELECT (CATHETERS) ×3 IMPLANT
CATH SHOCKWAVE 5.0X60 (CATHETERS) ×3 IMPLANT
CATH SPRT STRG 65X2.6FR ACPT (CATHETERS) ×2 IMPLANT
CATH STRAIGHT 5FR 65CM (CATHETERS) ×3 IMPLANT
CLOSURE MYNX CONTROL 6F/7F (Vascular Products) ×3 IMPLANT
CROWN 1.25 MICRO 145 DIAMONDBK (BURR) ×3 IMPLANT
DCB RANGER 5.0X40 135 (BALLOONS) ×2 IMPLANT
DEVICE EMBOSHIELD NAV6 2.5-4.8 (FILTER) ×3 IMPLANT
GUIDEWIRE ZILIENT 12G 018 (WIRE) ×6 IMPLANT
GUIDEWIRE ZILIENT 6G 014 (WIRE) ×3 IMPLANT
KIT ENCORE 26 ADVANTAGE (KITS) ×3 IMPLANT
KIT MICROPUNCTURE NIT STIFF (SHEATH) ×3 IMPLANT
KIT PV (KITS) ×3 IMPLANT
LUBRICANT VIPERSLIDE CORONARY (MISCELLANEOUS) ×3 IMPLANT
PATCH THROMBIX TOPICAL PLAIN (HEMOSTASIS) ×3 IMPLANT
RANGER DCB 5.0X40 135 (BALLOONS) ×3
SHEATH GLIDE SLENDER 4/5FR (SHEATH) ×3 IMPLANT
SHEATH PINNACLE 5F 10CM (SHEATH) ×3 IMPLANT
SHEATH PINNACLE 6F 10CM (SHEATH) ×3 IMPLANT
SHEATH PINNACLE MP 6F 45CM (SHEATH) ×3 IMPLANT
STENT ELUVIA 6X60X130 (Permanent Stent) ×3 IMPLANT
STOPCOCK MORSE 400PSI 3WAY (MISCELLANEOUS) ×3 IMPLANT
SYR MEDRAD MARK 7 150ML (SYRINGE) ×3 IMPLANT
TAPE VIPERTRACK RADIOPAQ (MISCELLANEOUS) ×2 IMPLANT
TAPE VIPERTRACK RADIOPAQUE (MISCELLANEOUS) ×3
TRANSDUCER W/STOPCOCK (MISCELLANEOUS) ×3 IMPLANT
TRAY PV CATH (CUSTOM PROCEDURE TRAY) ×3 IMPLANT
TUBING CIL FLEX 10 FLL-RA (TUBING) ×3 IMPLANT
WIRE HITORQ VERSACORE ST 145CM (WIRE) ×3 IMPLANT
WIRE VIPER ADVANCE .017X335CM (WIRE) ×3 IMPLANT
WIRE ZILIENT 018 4G (WIRE) ×6 IMPLANT

## 2019-11-25 NOTE — Plan of Care (Signed)
  Problem: Education: Goal: Knowledge of prescribed regimen will improve Outcome: Progressing   Problem: Activity: Goal: Ability to tolerate increased activity will improve Outcome: Progressing   Problem: Clinical Measurements: Goal: Postoperative complications will be avoided or minimized Outcome: Progressing Goal: Signs and symptoms of graft occlusion will improve Outcome: Progressing   Problem: Skin Integrity: Goal: Demonstration of wound healing without infection will improve Outcome: Progressing   Problem: Education: Goal: Knowledge of the prescribed therapeutic regimen will improve Outcome: Progressing Goal: Ability to verbalize activity precautions or restrictions will improve Outcome: Progressing Goal: Understanding of discharge needs will improve Outcome: Progressing   Problem: Activity: Goal: Ability to perform//tolerate increased activity and mobilize with assistive devices will improve Outcome: Progressing   Problem: Clinical Measurements: Goal: Postoperative complications will be avoided or minimized Outcome: Progressing   Problem: Self-Care: Goal: Ability to meet self-care needs will improve Outcome: Progressing   Problem: Self-Concept: Goal: Ability to maintain and perform role responsibilities to the fullest extent possible will improve Outcome: Progressing   Problem: Pain Management: Goal: Pain level will decrease with appropriate interventions Outcome: Progressing   Problem: Skin Integrity: Goal: Demonstration of wound healing without infection will improve Outcome: Progressing

## 2019-11-25 NOTE — Progress Notes (Signed)
Patient required bedpan & rolled to left side.  Noted gauze saturated.   Cleansed site with no leaking noted/applied new pressure dressing with gauze & tegrederm.  No firmness surrounding site including groin, thigh or abdomen.   Patient denies pain. Will continue to monitor

## 2019-11-25 NOTE — Interval H&P Note (Signed)
History and Physical Interval Note:  11/25/2019 10:58 AM  [name]  has presented today for surgery, with the diagnosis of ischemia.  The various methods of treatment have been discussed with the patient and family. After consideration of risks, benefits and other options for treatment, the patient has consented to  Procedure(s): ABDOMINAL AORTOGRAM W/LOWER EXTREMITY (N/A) as a surgical intervention.  The patient's history has been reviewed, patient examined, no change in status, stable for surgery.  I have reviewed the patient's chart and labs.  Questions were answered to the patient's satisfaction.     Kathlyn Sacramento

## 2019-11-26 ENCOUNTER — Encounter (HOSPITAL_BASED_OUTPATIENT_CLINIC_OR_DEPARTMENT_OTHER): Payer: Medicare Other | Admitting: Internal Medicine

## 2019-11-26 DIAGNOSIS — I70262 Atherosclerosis of native arteries of extremities with gangrene, left leg: Secondary | ICD-10-CM | POA: Diagnosis not present

## 2019-11-26 DIAGNOSIS — I739 Peripheral vascular disease, unspecified: Secondary | ICD-10-CM

## 2019-11-26 DIAGNOSIS — E1142 Type 2 diabetes mellitus with diabetic polyneuropathy: Secondary | ICD-10-CM | POA: Diagnosis not present

## 2019-11-26 DIAGNOSIS — I10 Essential (primary) hypertension: Secondary | ICD-10-CM | POA: Diagnosis not present

## 2019-11-26 DIAGNOSIS — L97528 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ity: Secondary | ICD-10-CM | POA: Diagnosis not present

## 2019-11-26 DIAGNOSIS — L97529 Non-pressure chronic ulcer of other part of left foot with unspecified severity: Secondary | ICD-10-CM | POA: Diagnosis not present

## 2019-11-26 DIAGNOSIS — D649 Anemia, unspecified: Secondary | ICD-10-CM | POA: Diagnosis not present

## 2019-11-26 DIAGNOSIS — Z4781 Encounter for orthopedic aftercare following surgical amputation: Secondary | ICD-10-CM | POA: Diagnosis not present

## 2019-11-26 DIAGNOSIS — Z95820 Peripheral vascular angioplasty status with implants and grafts: Secondary | ICD-10-CM

## 2019-11-26 DIAGNOSIS — E11621 Type 2 diabetes mellitus with foot ulcer: Secondary | ICD-10-CM | POA: Diagnosis not present

## 2019-11-26 DIAGNOSIS — E1152 Type 2 diabetes mellitus with diabetic peripheral angiopathy with gangrene: Secondary | ICD-10-CM | POA: Diagnosis not present

## 2019-11-26 HISTORY — DX: Peripheral vascular angioplasty status with implants and grafts: Z95.820

## 2019-11-26 LAB — GLUCOSE, CAPILLARY
Glucose-Capillary: 107 mg/dL — ABNORMAL HIGH (ref 70–99)
Glucose-Capillary: 142 mg/dL — ABNORMAL HIGH (ref 70–99)

## 2019-11-26 MED ORDER — METFORMIN HCL 1000 MG PO TABS: 1000.0000 mg | ORAL_TABLET | Freq: Two times a day (BID) | ORAL | 1 refills | Status: DC

## 2019-11-26 NOTE — Progress Notes (Signed)
Progress Note  Patient Name: [name] Date of Encounter: 11/26/2019  Primary Cardiologist: [name], MD   Subjective   No CP or dyspnea; mild pain left cath and access site  Inpatient Medications    Scheduled Meds: . vitamin C  1,000 mg Oral Q supper  . aspirin EC  81 mg Oral QPM  . atorvastatin  20 mg Oral QPM  . canagliflozin  100 mg Oral QAC breakfast  . Chlorhexidine Gluconate Cloth  6 each Topical Daily  . clopidogrel  75 mg Oral Daily  . diltiazem  240 mg Oral Daily  . glipiZIDE  5 mg Oral Q breakfast  . hydrochlorothiazide  25 mg Oral Daily  . irbesartan  300 mg Oral Daily  . metFORMIN  1,000 mg Oral BID WC  . metoprolol tartrate  100 mg Oral QHS  . metoprolol tartrate  50 mg Oral Daily  . pantoprazole  40 mg Oral Daily  . [START ON 11/30/2019] Semaglutide(0.25 or 0.5MG /DOS)  0.5 mg Subcutaneous Q Mon  . sodium chloride flush  3 mL Intravenous Q12H  . vitamin B-12  1,000 mcg Oral Q supper   Continuous Infusions: . sodium chloride    . DAPTOmycin (CUBICIN)  IV Stopped (11/25/19 2207)  . meropenem (MERREM) IV Stopped (11/26/19 0444)   PRN Meds: sodium chloride, acetaminophen, calcium carbonate, labetalol, ondansetron (ZOFRAN) IV, sodium chloride flush   Vital Signs    Vitals:   11/25/19 1520 11/25/19 2005 11/25/19 2317 11/26/19 0410  BP: (!) 160/54 (!) 131/42 (!) 120/42 (!) 128/52  Pulse: 67 77 81 76  Resp:  14 14 10   Temp: 97.6 F (36.4 C) 98.1 F (36.7 C) 98.2 F (36.8 C) 98 F (36.7 C)  TempSrc: Oral Oral Oral Oral  SpO2: 95% 98% 98% 98%  Weight:      Height:        Intake/Output Summary (Last 24 hours) at 11/26/2019 0830 Last data filed at 11/26/2019 0700 Gross per 24 hour  Intake 670 ml  Output -  Net 670 ml   Last 3 Weights 11/25/2019 11/17/2019 11/11/2019  Weight (lbs) 140 lb 141 lb 144 lb  Weight (kg) 63.504 kg 63.957 kg 65.318 kg      Telemetry    Sinus with rare PVC - Personally Reviewed   Physical Exam   GEN: No acute  distress.   Neck: No JVD Cardiac: RRR, no murmurs, rubs, or gallops.  Respiratory: Clear to auscultation bilaterally. GI: Soft, nontender, non-distended  MS: No edemal Right groin with no hematoma; + bruit; left DP access site with no hematoma Neuro:  Nonfocal  Psych: Normal affect   Radiology    PERIPHERAL VASCULAR CATHETERIZATION  Result Date: 11/25/2019 1.  Nonobstructive aortoiliac disease. 2.  Left lower extremity: Diffuse calcified SFA disease, short occlusion of the popliteal artery above the knee and two-vessel runoff below the knee with diffuse disease in the proximal anterior tibial artery. 3.  Successful complex retrograde and antegrade revascularization of the left lower extremity as outlined above. Recommendations: Continue long-term dual antiplatelet therapy. Aggressive treatment of risk factors. Hydrate overnight.   Patient Profile     79 y.o. female with past medical history of diabetes mellitus, hypertension, hyperlipidemia, peripheral vascular disease admitted following percutaneous intervention left lower extremity  Assessment & Plan    1 peripheral vascular disease-patient doing well status post complex retrograde and antegrade revascularization of left lower extremity.  Continue aspirin, Plavix and statin.  Patient will be discharged today  and follow-up with Dr. Gwenlyn Found next week.  Outpatient Dopplers.  2 hypertension-blood pressure controlled.  Continue present medications and follow.  3 hyperlipidemia-continue statin.  Would consider increasing to 80 mg daily given documented vascular disease but will leave to Dr. Gwenlyn Found.  Greater than 30 minutes PA and physician time. D2  For questions or updates, please contact [place] Please consult [email or web address] for contact info under        Signed, Kirk Ruths, MD  11/26/2019, 8:30 AM

## 2019-11-26 NOTE — Discharge Instructions (Signed)
Call [facility] at [phone] if any bleeding, swelling or drainage at cath site.  May shower, no tub baths for 48 hours for groin sticks. No lifting over 5 pounds for 3 days.  No Driving for 3 days  Heart Healthy Diabetic Diet  Keep follow up appointments.    No Metformin until Saturday the 6th of March It may interact with cath dye.

## 2019-11-26 NOTE — Progress Notes (Signed)
Discharge:  Discharge summary reviewed with patient, discharge medication list reviewed with patient.  All questions answered.  CCMD notified.  Patient discharging with [place] line from prior to admission.  Assisted to vehicle by staff.

## 2019-11-26 NOTE — Discharge Summary (Signed)
Discharge Summary    Patient ID: [name] MRN: [id number]; DOB: Dec 13, 1940  Admit date: 11/25/2019 Discharge date: 11/26/2019  Primary Care Provider: [name], MD  Primary Cardiologist: [name], MD  Primary Electrophysiologist:  None   Discharge Diagnoses    Principal Problem:   PAD (peripheral artery disease) ([place]) Active Problems:   S/P angioplasty with stent Lt SFA of prox segment.  PTCAs with drug coated balloon Lt ant tibial artery and Lt popliteal artery    Essential hypertension   Dyslipidemia   Gangrene of foot (HCC)    Diagnostic Studies/Procedures   abdominal aortogram 11/25/19  1.  Nonobstructive aortoiliac disease. 2.  Left lower extremity: Diffuse calcified SFA disease, short occlusion of the popliteal artery above the knee and two-vessel runoff below the knee with diffuse disease in the proximal anterior tibial artery. 3.  Successful complex retrograde and antegrade revascularization of the left lower extremity as outlined above.  Recommendations: Continue long-term dual antiplatelet therapy. Aggressive treatment of risk factors. Hydrate overnight. _____________   History of Present Illness     [name] is a 79 y.o. female with HTN, DM, HLD no hx of MI or CVA.  She developed ischemic appearing wounds on her Rt second and third toes and dopplers revealed a right ABI 0.75, left 2.79 with moderately severe right SFA stenosis and an occluded right posterior tibial artery.  In Sept 2020 she had PV angio with failed attempt at crossing the left SFA CTO.    She has one-vessel runoff bilaterally. She did develop gangrenous right first and second toes. Dr. Kennon Holter  initial intent was to perform endovascular therapy of a complex calcified high-grade right common femoral artery stenosis with infrainguinal disease but ultimately referred her to Dr. Trula Slade who performed right common femoral endarterectomy with patch angioplasty 07/21/2019 with  excellent result.     In Nov 07/2019 orbital atherectomy followed by drug-coated balloon angioplasty of her right SFA, popliteal and tibioperoneal trunk on 08/03/2019 with excellent result. Her follow-up Dopplers performed 08/14/2019 revealed a widely patent SFA and tibioperoneal trunk. 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 Jacqualyn Posey on 08/07/2019 which almost completely healed with the assistance of hyperbaric oxygen.   She saw Dr. Adora Fridge back 11/17/19 and now with 2 wounds on Lt foot, tip of the Lt third toe on top of the 4th toes which has osteomyelitis treated with IV antibiotics and continues with hyperbaric oxygen.  Plans for tibial pedal access of the left dorsalis pedis.     Pt presented to lab 11/25/19 and underwent procedure as above with orbital atherectomy and PTCA of Lt anterior tibial artery, orbital arthrectomy with drug coated angioplasty and intravascular lithotripsy of the Lt popliteal artery,  Orbital atherectomy,intravascular lithotripsy, drug coated balloon angioplasty of the left SFA with stenting of the proximal segment due to flow-limiting dissection.  She was admitted for overnight obs.   Hospital Course     Consultants: none   Pt did well post procedure.  She did have mild pain at cath site.  She is stable for discharge, seen and evaluated by Dr. Stanford Breed.  Continue current meds though consider increase of statin as outpt. Her follow up dopplers and visits are arranged.    Did the patient have an acute coronary syndrome (MI, NSTEMI, STEMI, etc) this admission?:  No  Did the patient have a percutaneous coronary intervention (stent / angioplasty)?:  No.   _____________  Discharge Vitals Blood pressure (!) 126/39, pulse 76, temperature 98 F (36.7 C), temperature source Oral, resp. rate 10, height 5' 4.5" (1.638 m), weight 63.5 kg, SpO2 98 %.  Filed Weights   11/25/19 1011  Weight: 63.5 kg    Labs & Radiologic Studies    CBC No  results for input(s): WBC, NEUTROABS, HGB, HCT, MCV, PLT in the last 72 hours. Basic Metabolic Panel No results for input(s): NA, K, CL, CO2, GLUCOSE, BUN, CREATININE, CALCIUM, MG, PHOS in the last 72 hours. Liver Function Tests No results for input(s): AST, ALT, ALKPHOS, BILITOT, PROT, ALBUMIN in the last 72 hours. No results for input(s): LIPASE, AMYLASE in the last 72 hours. High Sensitivity Troponin:   No results for input(s): TROPONINIHS in the last 720 hours.  BNP Invalid input(s): POCBNP D-Dimer No results for input(s): DDIMER in the last 72 hours. Hemoglobin A1C No results for input(s): HGBA1C in the last 72 hours. Fasting Lipid Panel No results for input(s): CHOL, HDL, LDLCALC, TRIG, CHOLHDL, LDLDIRECT in the last 72 hours. Thyroid Function Tests No results for input(s): TSH, T4TOTAL, T3FREE, THYROIDAB in the last 72 hours.  Invalid input(s): FREET3 _____________  PERIPHERAL VASCULAR CATHETERIZATION  Result Date: 11/25/2019 1.  Nonobstructive aortoiliac disease. 2.  Left lower extremity: Diffuse calcified SFA disease, short occlusion of the popliteal artery above the knee and two-vessel runoff below the knee with diffuse disease in the proximal anterior tibial artery. 3.  Successful complex retrograde and antegrade revascularization of the left lower extremity as outlined above. Recommendations: Continue long-term dual antiplatelet therapy. Aggressive treatment of risk factors. Hydrate overnight.  Disposition   Pt is being discharged home today in good condition.  Follow-up Plans & Appointments  Call [facility] at [phone] if any bleeding, swelling or drainage at cath site.  May shower, no tub baths for 48 hours for groin sticks. No lifting over 5 pounds for 3 days.  No Driving for 3 days  Heart Healthy Diabetic Diet  Keep follow up appointments.    No Metformin until Saturday the 6th of March It may interact with cath dye.   Follow-up  Information    [facility] Follow up on 12/04/2019.   Specialty: Cardiology Why: at 9:00AM  for dopplers of Lt leg Contact information: [address] [place] [phone]       [name], MD Follow up on 12/08/2019.   Specialties: Cardiology, Radiology Why: at [place] information: [address] [place] [address] [phone]            Discharge Medications   Allergies as of 11/26/2019      Reactions   Bactrim [sulfamethoxazole-trimethoprim] Other (See Comments)   ^ K+( elevated)    Demerol [meperidine]    Delusional    Scopolamine    Delusional    Versed [midazolam] Anxiety   Frantic, out of my mind, agitated       Medication List    TAKE these medications   acetaminophen 500 MG tablet Commonly known as: TYLENOL Take 1,000 mg by mouth every 6 (six) hours as needed for moderate pain or headache.   aspirin EC 81 MG tablet Take 81 mg by mouth every evening.   atorvastatin 20 MG tablet Commonly known as: LIPITOR Take 20 mg by mouth every evening.   calcium carbonate 500  MG chewable tablet Commonly known as: TUMS - dosed in mg elemental calcium Chew 2 tablets by mouth daily as needed for indigestion or heartburn.   CINNAMON PO Take 1,000 mg by mouth in the morning, at noon, and at bedtime. Breakfast, Supper, & bedtime   clopidogrel 75 MG tablet Commonly known as: PLAVIX Take 1 tablet by mouth once daily   [employer] SENIOR PROBIOTIC PO Take 1 capsule by mouth daily with breakfast.   DAPTOmycin 500 MG injection Commonly known as: CUBICIN Inject 500 mg into the vein every evening.   diltiazem 240 MG 24 hr capsule Commonly known as: Cartia XT Take 1 capsule (240 mg total) by mouth daily.   glipiZIDE 5 MG 24 hr tablet Commonly known as: GLUCOTROL XL Take 1 tablet by mouth once daily with breakfast What changed: See the new instructions.   hydrochlorothiazide 25 MG  tablet Commonly known as: HYDRODIURIL Take 1 tablet by mouth once daily   Invokana 100 MG Tabs tablet Generic drug: canagliflozin TAKE 1 TABLET BY MOUTH BEFORE BREAKFAST   meropenem 1 g injection Commonly known as: MERREM 1 g every 8 (eight) hours.   metFORMIN 1000 MG tablet Commonly known as: GLUCOPHAGE Take 1 tablet (1,000 mg total) by mouth 2 (two) times daily. Start taking on: November 28, 2019 What changed: These instructions start on November 28, 2019. If you are unsure what to do until then, ask your doctor or other care provider.   metoprolol tartrate 100 MG tablet Commonly known as: LOPRESSOR Take half tab in the morning and a whole tab in the evening. What changed:   how much to take  how to take this  when to take this  additional instructions   Ozempic (0.25 or 0.5 MG/DOSE) 2 MG/1.5ML Sopn Generic drug: Semaglutide(0.25 or 0.5MG /DOS) Inject 0.5 mg into the skin every Monday.   pantoprazole 40 MG tablet Commonly known as: PROTONIX Take 1 tablet (40 mg total) by mouth daily. What changed: when to take this   telmisartan 80 MG tablet Commonly known as: MICARDIS Take 1 tablet (80 mg total) by mouth daily. What changed:   how much to take  when to take this   vitamin B-12 1000 MCG tablet Commonly known as: CYANOCOBALAMIN Take 1,000 mcg by mouth daily with supper.   vitamin C 1000 MG tablet Take 1,000 mg by mouth daily with supper.          Outstanding Labs/Studies  Consider BMP   Duration of Discharge Encounter   Greater than 30 minutes including physician time.  Signed, Cecilie Kicks, NP 11/26/2019, 11:10 AM

## 2019-11-27 ENCOUNTER — Encounter (HOSPITAL_BASED_OUTPATIENT_CLINIC_OR_DEPARTMENT_OTHER): Payer: Medicare Other | Admitting: Internal Medicine

## 2019-11-27 ENCOUNTER — Encounter (HOSPITAL_BASED_OUTPATIENT_CLINIC_OR_DEPARTMENT_OTHER): Payer: Self-pay

## 2019-11-27 ENCOUNTER — Other Ambulatory Visit: Payer: Self-pay

## 2019-11-30 ENCOUNTER — Other Ambulatory Visit: Payer: Self-pay

## 2019-11-30 ENCOUNTER — Encounter (HOSPITAL_BASED_OUTPATIENT_CLINIC_OR_DEPARTMENT_OTHER): Payer: Medicare Other | Attending: Internal Medicine | Admitting: Internal Medicine

## 2019-11-30 DIAGNOSIS — D649 Anemia, unspecified: Secondary | ICD-10-CM | POA: Diagnosis not present

## 2019-11-30 DIAGNOSIS — L97528 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ity: Secondary | ICD-10-CM | POA: Insufficient documentation

## 2019-11-30 DIAGNOSIS — Z4781 Encounter for orthopedic aftercare following surgical amputation: Secondary | ICD-10-CM | POA: Diagnosis not present

## 2019-11-30 DIAGNOSIS — M869 Osteomyelitis, unspecified: Secondary | ICD-10-CM | POA: Diagnosis not present

## 2019-11-30 DIAGNOSIS — E1152 Type 2 diabetes mellitus with diabetic peripheral angiopathy with gangrene: Secondary | ICD-10-CM | POA: Diagnosis not present

## 2019-11-30 DIAGNOSIS — L97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 limited to breakdown of skin: Secondary | ICD-10-CM | POA: Insufficient documentation

## 2019-11-30 DIAGNOSIS — M86371 Chronic multifocal osteomyelitis, right ankle and foot: Secondary | ICD-10-CM | POA: Insufficient documentation

## 2019-11-30 DIAGNOSIS — L97514 Non-pressure chronic ulcer of other part of right foot with necrosis of bone: Secondary | ICD-10-CM | POA: Diagnosis not present

## 2019-11-30 DIAGNOSIS — E11621 Type 2 diabetes mellitus with foot ulcer: Secondary | ICD-10-CM | POA: Diagnosis not present

## 2019-11-30 DIAGNOSIS — E1142 Type 2 diabetes mellitus with diabetic polyneuropathy: Secondary | ICD-10-CM | POA: Insufficient documentation

## 2019-11-30 DIAGNOSIS — I10 Essential (primary) hypertension: Secondary | ICD-10-CM | POA: Diagnosis not present

## 2019-11-30 LAB — GLUCOSE, CAPILLARY
Glucose-Capillary: 171 mg/dL — ABNORMAL HIGH (ref 70–99)
Glucose-Capillary: 126 mg/dL — ABNORMAL HIGH (ref 70–99)

## 2019-11-30 NOTE — Progress Notes (Addendum)
[name], [name] ([id number]) Visit Report for 11/30/2019 HBO Details Patient Name: Date of Service: [name], [name] 11/30/2019 2:00 PM Medical Record Number:[id number] Patient Account Number: [id number] Date of Birth/Sex: Treating RN: 1941/02/12 (79 y.o. [name] Primary Care Arizbeth Cawthorn: [name] Other Clinician: [name] Referring Demarian Epps: Treating Azizah Lisle/[name]:[name], [name], [name] Weeks in Treatment: 20 HBO Treatment Course Details Treatment Course Number: 1 Ordering Adom Schoeneck: [name] Total Treatments Ordered: 40 HBO Treatment Start Date: 10/12/2019 HBO Indication: Diabetic Ulcer(s) of the Lower Extremity HBO Treatment Details Treatment Number: 30 Patient Type: Outpatient Chamber Type: Monoplace Chamber Serial #: [id number] Treatment Protocol: 2.5 ATA with 90 minutes oxygen, with two 5 minute air breaks Treatment Details Compression Rate Down: 2.0 psi / minute De-Compression Rate Up: 2.0 psi / minute Air breaks and CompressTx Pressure breathing periods DecompressDecompress Begins Reached (leave unused spaces Begins Ends blank) Chamber Pressure (ATA)1 2.5 2.5 2.5 2.5 2.5 --2.5 1 Clock Time (24 hr) 14:13 14:25 [id number]:62--22:97 16:17 Treatment Length: 124 (minutes) Treatment Segments: 4 Vital Signs Capillary Blood Glucose Reference Range: 80 - 120 mg / dl HBO Diabetic Blood Glucose Intervention Range: <131 mg/dl or >249 mg/dl Time Vitals Blood Respiratory Capillary Blood Glucose Pulse Action Type: Pulse: Temperature: Taken: Pressure: Rate: Glucose (mg/dl): Meter #: Oximetry (%) Taken: Pre 13:55 158/57 80 16 98.3 171 Post 16:20 143/70 72 18 98.5 126 Treatment Response Treatment Toleration: Well Treatment Completion Treatment Completed without Adverse Event Status: Additional Procedure Documentation Tissue Sevierity: Necrosis of bone [name] Notes Patient returns for hyperbaric treatment after hiatus for left  leg revascularization. She tolerated treatment well Physician HBO Attestation: I certify that I supervised this HBO treatment in accordance with Medicare guidelines. A trained Yes emergency response team is readily available per hospital policies and procedures. Continue HBOT as ordered. Yes Electronic Signature(s) Signed: 11/30/2019 5:29:15 PM By: [name] MD Previous Signature: 11/30/2019 4:54:41 PM Version By: [name] EMT/HBOT Entered By: [name] on 11/30/2019 17:28:04 -------------------------------------------------------------------------------- HBO Safety Checklist Details Patient Name: Date of Service: [name] 11/30/2019 2:00 PM Medical Record Number:[id number] Patient Account Number: [id number] Date of Birth/Sex: Treating RN: January 09, 1941 (79 y.o. [name] Primary Care Caleen Taaffe: [name] Other Clinician: [name] Referring Jaydence Vanyo: Treating [place] Pflum/[name]:[name], [name], [name] Weeks in Treatment: 20 HBO Safety Checklist Items Safety Checklist Consent Form Signed Patient voided / foley secured and emptied When did you last eato 1200 - oatmeal / coffee Last dose of injectable or oral agent metformin NA Ostomy pouch emptied and vented if applicable All implantable devices assessed, documented and approved Intravenous access site secured and place PICC line left arm Valuables secured Linens and cotton and cotton/polyester blend (less than 51% polyester) Personal oil-based products / skin lotions / body lotions removed Wigs or hairpieces removed NA Smoking or tobacco materials removed Books / newspapers / magazines / loose paper removed Cologne, aftershave, perfume and deodorant removed Jewelry removed (may wrap wedding band) Make-up removed Hair care products removed NA Battery operated devices (external) removed NA Heating patches and chemical warmers removed NA Titanium eyewear removed Nail polish  cured greater than 10 hours NA Casting material cured greater than 10 hours NA Hearing aids removed NA Loose dentures or partials removed NA Prosthetics have been removed Patient demonstrates correct use of air break device (if applicable) Patient concerns have been addressed Patient grounding bracelet on and cord attached to chamber Specifics for Inpatients (complete in addition to above) Medication sheet sent with patient Intravenous medications needed or due during therapy  sent with patient Drainage tubes (e.g. nasogastric tube or chest tube secured and vented) Endotracheal or Tracheotomy tube secured Cuff deflated of air and inflated with saline Airway suctioned Electronic Signature(s) Signed: 11/30/2019 2:01:00 PM By: [name] EMT/HBOT Entered By: [name] on 11/30/2019 14:01:00

## 2019-11-30 NOTE — Progress Notes (Signed)
CECELIA, GRACIANO ([id number]) Visit Report for 11/30/2019 SuperBill Details Patient Name: Date of Service: QUORRA, ROSENE 11/30/2019 Medical Record Number:[id number] Patient Account Number: [id number] Date of Birth/Sex: Treating RN: 10/25/1940 (79 y.o. 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, 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 Facility Procedures The patient participates with Medicare or their insurance follows the Medicare Facility Guidelines CPT4 Code Description Modifier Quantity [id number] G0277-(Facility Use Only) HBOT, full body chamber, 20min 4 Physician Procedures CPT4 Code Description Modifier Quantity [id number] 15520 - WC PHYS HYPERBARIC OXYGEN THERAPY 1 ICD-10 Diagnosis Description E11.621 Type 2 diabetes mellitus with foot ulcer Electronic Signature(s) Signed: 11/30/2019 4:54:41 PM By: [name] EMT/HBOT Signed: 11/30/2019 5:29:15 PM By: [name] MD Entered By: [name] on 11/30/2019 16:53:34

## 2019-11-30 NOTE — Progress Notes (Signed)
PETRA, DUMLER ([id number]) Visit Report for 11/30/2019 Arrival Information Details Patient Name: Date of Service: [name] 11/30/2019 2:00 PM Medical Record Number:[id number] Patient Account Number: [id number] Date of Birth/Sex: Treating RN: 1941/01/15 (79 y.o. [name] Primary Care Dovid Bartko: [name] Other Clinician: [name] Referring Allyn Bertoni: Treating Kamesha Herne/[name]:[name], [name], [name] Weeks in Treatment: 20 Visit Information History Since Last Visit Added or deleted any medications: No Patient Arrived: [name] Any new allergies or adverse reactions: No Arrival Time: 13:50 Had a fall or experienced change in No Accompanied By: self activities of daily living that may affect Transfer Assistance: None risk of falls: Patient Identification Verified: Yes Signs or symptoms of abuse/neglect since last No Secondary Verification Process Yes visito Completed: Hospitalized since last visit: No Patient Requires Transmission- No Implantable device outside of the clinic excluding No Based Precautions: cellular tissue based products placed in the center Patient Has Alerts: Yes since last visit: Patient Alerts: 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 [name] Primary Care Hero Kulish: [name] Other Clinician: [name] Referring Zacarias Krauter: Treating Love Milbourne/[name]:[name], [name], [name] Weeks in Treatment: 20 Encounter Discharge  Information Items Discharge Condition: Stable Ambulatory Status: Walker Discharge Destination: Home Transportation: Private Auto Accompanied By: self Schedule Follow-up Appointment: Yes Clinical Summary of Care: Patient Declined Electronic Signature(s) Signed: 11/30/2019 4:54:41 PM By: [name] EMT/HBOT Entered By: [name] on 11/30/2019 16:54:03 -------------------------------------------------------------------------------- Patient/Caregiver Education Details Patient Name: Date of Service: [name], [name] H. 3/8/2021andnbsp2:00 PM Medical Record Number:[id number] Patient Account Number: [id number] Date of Birth/Gender: Treating RN: 09-May-1941 (78 y.o. [name] Primary Care Physician: [name] Other Clinician: [name] Referring Physician: Treating Physician/[name]:[name], [name], [name] in Treatment: 20 Education Assessment Education Provided To: Patient Education Topics Provided Hyperbaric Oxygenation: Methods: Explain/Verbal Responses: State content correctly Electronic Signature(s) Signed: 11/30/2019 4:54:41 PM By: [name] EMT/HBOT Entered By: [name] on 11/30/2019 16:53:48 -------------------------------------------------------------------------------- Vitals Details Patient Name: Date of Service: [name] 11/30/2019 2:00 PM Medical Record Number:[id number] Patient Account Number: [id number] Date of Birth/Sex: Treating RN: 02/25/1941 (79 y.o. [name] Primary Care [name]: [name] Other Clinician: [name] Referring Nirvaan Frett: Treating Kymberlie Brazeau/[name]:[name], [name], [name] Weeks in Treatment: 20 Vital Signs Time Taken: 13:55 Temperature (F): 98.3 Height (in): 64 Pulse (bpm): 80 Weight (lbs): 142 Respiratory Rate (breaths/min): 16 Body Mass Index (BMI): 24.4 Blood Pressure (mmHg): 158/57 Capillary Blood Glucose (mg/dl): 171 Reference Range: 80 -  120 mg / dl Electronic Signature(s) Signed: 11/30/2019 4:54:41 PM By: [name] EMT/HBOT Entered By: [name] on 11/30/2019 14:00:09

## 2019-12-01 ENCOUNTER — Encounter (HOSPITAL_BASED_OUTPATIENT_CLINIC_OR_DEPARTMENT_OTHER): Payer: Medicare Other | Admitting: Internal Medicine

## 2019-12-01 DIAGNOSIS — H35372 Puckering of macula, left eye: Secondary | ICD-10-CM | POA: Diagnosis not present

## 2019-12-01 DIAGNOSIS — Z961 Presence of intraocular lens: Secondary | ICD-10-CM | POA: Diagnosis not present

## 2019-12-01 DIAGNOSIS — E113493 Type 2 diabetes mellitus with severe nonproliferative diabetic retinopathy without macular edema, bilateral: Secondary | ICD-10-CM | POA: Diagnosis not present

## 2019-12-01 DIAGNOSIS — H47011 Ischemic optic neuropathy, right eye: Secondary | ICD-10-CM | POA: Diagnosis not present

## 2019-12-02 ENCOUNTER — Other Ambulatory Visit: Payer: Self-pay

## 2019-12-02 ENCOUNTER — Encounter (HOSPITAL_BASED_OUTPATIENT_CLINIC_OR_DEPARTMENT_OTHER): Payer: Medicare Other | Admitting: Physician Assistant

## 2019-12-02 DIAGNOSIS — M86371 Chronic multifocal osteomyelitis, right ankle and foot: Secondary | ICD-10-CM | POA: Diagnosis not present

## 2019-12-02 DIAGNOSIS — E1152 Type 2 diabetes mellitus with diabetic peripheral angiopathy with gangrene: Secondary | ICD-10-CM | POA: Diagnosis not present

## 2019-12-02 DIAGNOSIS — E1142 Type 2 diabetes mellitus with diabetic polyneuropathy: Secondary | ICD-10-CM | POA: Diagnosis not present

## 2019-12-02 DIAGNOSIS — E11621 Type 2 diabetes mellitus with foot ulcer: Secondary | ICD-10-CM | POA: Diagnosis not present

## 2019-12-02 DIAGNOSIS — D649 Anemia, unspecified: Secondary | ICD-10-CM | POA: Diagnosis not present

## 2019-12-02 DIAGNOSIS — L97528 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ity: Secondary | ICD-10-CM | POA: Diagnosis not present

## 2019-12-02 DIAGNOSIS — Z4781 Encounter for orthopedic aftercare following surgical amputation: Secondary | ICD-10-CM | POA: Diagnosis not present

## 2019-12-02 DIAGNOSIS — L97514 Non-pressure chronic ulcer of other part of right foot with necrosis of bone: Secondary | ICD-10-CM | POA: Diagnosis not present

## 2019-12-02 DIAGNOSIS — L97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 limited to breakdown of skin: Secondary | ICD-10-CM | POA: Diagnosis not present

## 2019-12-02 DIAGNOSIS — I10 Essential (primary) hypertension: Secondary | ICD-10-CM | POA: Diagnosis not present

## 2019-12-02 LAB — GLUCOSE, CAPILLARY
Glucose-Capillary: 151 mg/dL — ABNORMAL HIGH (ref 70–99)
Glucose-Capillary: 125 mg/dL — ABNORMAL HIGH (ref 70–99)

## 2019-12-02 NOTE — Progress Notes (Addendum)
[name] ([id number]) Visit Report for 12/02/2019 HBO Details Patient Name: Date of Service: [name], [name] 12/02/2019 2:00 PM Medical Record Number:[id number] Patient Account Number: [id number] Date of Birth/Sex: Treating RN: 1940/11/11 (79 y.o. [name] Primary Care [name]: [name] Other Clinician: Referring [name]: Treating [name]/[name]:[name], [name], [name]: 20 HBO [name] Course Details [name] Course Number: 1 Ordering [name]: [name] Total Treatments Ordered: 40 HBO [name] Start Date: 10/12/2019 HBO Indication: Diabetic Ulcer(s) of the Lower Extremity HBO [name] Details [name] Number: [id number] Patient Type: Outpatient Chamber Type: Monoplace Chamber Serial #: [id number] [name] Protocol: 2.5 ATA with 90 minutes oxygen, with two 5 minute air breaks [name] Details Compression Rate Down: 2.0 psi / minute De-Compression Rate Up: 2.0 psi / minute Air breaks and CompressTx Pressure breathing periods DecompressDecompress Begins Reached (leave unused spaces Begins Ends blank) Chamber Pressure (ATA)1 2.5 2.5 2.5 2.5 2.5 --2.5 1 Clock Time (24 hr) 14:16 14:28 96:7893:8101:7510:25--85:27 16:20 [name] Length: 124 (minutes) [name] Segments: 4 Vital Signs Capillary Blood Glucose Reference Range: 80 - 120 mg / dl HBO Diabetic Blood Glucose Intervention Range: <131 mg/dl or >249 mg/dl Time Vitals Blood Respiratory Capillary Blood Glucose Pulse Action Type: Pulse: Temperature: Taken: Pressure: Rate: Glucose (mg/dl): Meter #: Oximetry (%) Taken: Pre 14:00 126/79 68 19 97.5 151 Post 16:22 124/38 70 16 97.4 125 [name] Response [name] Toleration: Well [name] Completion [name] Completed without Adverse Event Status: Additional Procedure Documentation Tissue Sevierity: Necrosis of bone Physician HBO Attestation: I certify that I supervised this HBO [name] in accordance  with Medicare guidelines. A trained Yes Yes emergency response team is readily available per hospital policies and procedures. Continue HBOT as ordered. Yes Electronic Signature(s) Signed: 12/02/2019 5:29:08 PM By: [name] Keeler PA-C Entered By: [name] on 12/02/2019 17:29:08 -------------------------------------------------------------------------------- HBO Safety Checklist Details Patient Name: Date of Service: [name], [name] 12/02/2019 2:00 PM Medical Record Number:[id number] Patient Account Number: [id number] Date of Birth/Sex: Treating RN: 1941-09-13 (79 y.o. [name], [name] Primary Care [name]: [name] Other Clinician: Mikeal [name] Referring Manaia Samad: Treating [name]/[name]:[name], [name], [name]: 20 HBO Safety Checklist Items Safety Checklist Consent Form Signed Patient voided / foley secured and emptied When did you last eato 1200 - rice soup Last dose of injectable or oral agent metformin NA Ostomy pouch emptied and vented if applicable All implantable devices assessed, documented and approved Intravenous access site secured and place PICC line left arm Valuables secured Linens and cotton and cotton/polyester blend (less than 51% polyester) Personal oil-based products / skin lotions / body lotions removed Wigs or hairpieces removed NA Smoking or tobacco materials removed Books / newspapers / magazines / loose paper removed Cologne, aftershave, perfume and deodorant removed Jewelry removed (may wrap wedding band) Make-up removed Hair care products removed NA Battery operated devices (external) removed NA Heating patches and chemical warmers removed NA Titanium eyewear removed Nail polish cured greater than 10 hours NA Casting material cured greater than 10 hours NA Hearing aids removed NA Loose dentures or partials removed NA Prosthetics have been removed Patient demonstrates correct use of air  break device (if applicable) Patient concerns have been addressed Patient grounding bracelet on and cord attached to chamber Specifics for Inpatients (complete in addition to above) Medication sheet sent with patient Intravenous medications needed or due during therapy sent with patient Drainage tubes (e.g. nasogastric tube or chest tube secured and vented) Endotracheal or Tracheotomy tube secured Cuff deflated of air and inflated  with saline Airway suctioned Electronic Signature(s) Signed: 12/03/2019 4:27:59 PM By: [name] EMT/HBOT Previous Signature: 12/02/2019 2:04:40 PM Version By: [name] EMT/HBOT Entered By: [name] on 12/03/2019 14:10:52

## 2019-12-02 NOTE — Progress Notes (Addendum)
[name], [name] ([id number]) Visit Report for 12/02/2019 Arrival Information Details Patient Name: Date of Service: [name], TOLLESON 12/02/2019 2:00 PM Medical Record Number:[id number] Patient Account Number: [id number] Date of Birth/Sex: Treating RN: [birth year] (79 y.o. [name] Primary Care Dwon Sky: [name] Other Clinician: [name] Referring Kadejah Sandiford: Treating Trevia Nop/[name]:Stone III, Alen Bleacher, [name] Weeks in Treatment: 20 Visit Information History Since Last Visit Added or deleted any medications: No Patient Arrived: [name] Any new allergies or adverse reactions: No Arrival Time: 13:55 Had a fall or experienced change in No Accompanied By: self activities of daily living that may affect Transfer Assistance: None risk of falls: Patient Identification Verified: Yes Signs or symptoms of abuse/neglect since last No Secondary Verification Process Yes visito Completed: Hospitalized since last visit: No Patient Requires Transmission- No Implantable device outside of the clinic excluding No Based Precautions: cellular tissue based products placed in the center Patient Has Alerts: Yes since last visit: Patient Alerts: 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 [name] Primary Care Hershel Corkery: [name] Other Clinician: Referring Susan Arana: Treating Chrissy Ealey/[name]:Stone III, Alen Bleacher, [name] Weeks in Treatment: 20 Encounter Discharge Information  Items Discharge Condition: Stable Ambulatory Status: Walker Discharge Destination: Home Transportation: Private Auto Accompanied By: alone Schedule Follow-up Appointment: Yes Clinical Summary of Care: Patient Declined Electronic Signature(s) Signed: 12/07/2019 5:44:18 PM By: [name] RN, BSN Entered By: [name] on 12/02/2019 16:46:43 -------------------------------------------------------------------------------- [place] Details Patient Name: Date of Service: [name] 12/02/2019 2:00 PM Medical Record Number:[id number] Patient Account Number: [id number] Date of Birth/Sex: Treating RN: [birth year] (79 y.o. [name], [name] Primary Care Alycea Segoviano: [name] Other Clinician: [name] Referring Talib Headley: Treating Gibson Telleria/[name]:Stone III, Alen Bleacher, [name] Weeks in Treatment: 20 Vital Signs Time Taken: 14:00 Temperature (F): 97.5 Height (in): 64 Pulse (bpm): 68 Weight (lbs): 142 Respiratory Rate (breaths/min): 19 Body Mass Index (BMI): 24.4 Blood Pressure (mmHg): 126/79 Capillary Blood Glucose (mg/dl): 151 Reference Range: 80 - 120 mg / dl Electronic Signature(s) Signed: 12/02/2019 2:27:25 PM By: [name] EMT/HBOT Entered By: [name] on 12/02/2019 14:03:52

## 2019-12-02 NOTE — Progress Notes (Signed)
[name], HORN ([id number]) Visit Report for 12/02/2019 Problem List Details Patient Name: Date of Service: [name], [name] 12/02/2019 2:00 PM Medical Record Number:[id number] Patient Account Number: [id number] Date of Birth/Sex: Treating RN: 1941-06-10 (79 y.o. 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 07/13/2019 No Yes with other specified severity L97.528 Non-pressure chronic ulcer of other part of left foot 07/13/2019 No Yes with other specified severity E11.42 Type 2 diabetes mellitus with diabetic polyneuropathy 07/13/2019 No Yes 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, 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

## 2019-12-03 ENCOUNTER — Other Ambulatory Visit: Payer: Self-pay

## 2019-12-03 ENCOUNTER — Encounter (HOSPITAL_BASED_OUTPATIENT_CLINIC_OR_DEPARTMENT_OTHER): Payer: Medicare Other | Admitting: Internal Medicine

## 2019-12-03 ENCOUNTER — Encounter (HOSPITAL_BASED_OUTPATIENT_CLINIC_OR_DEPARTMENT_OTHER): Payer: Self-pay

## 2019-12-03 DIAGNOSIS — E11621 Type 2 diabetes mellitus with foot ulcer: Secondary | ICD-10-CM | POA: Diagnosis not present

## 2019-12-03 DIAGNOSIS — E1142 Type 2 diabetes mellitus with diabetic polyneuropathy: Secondary | ICD-10-CM | POA: Diagnosis not present

## 2019-12-03 DIAGNOSIS — M86371 Chronic multifocal osteomyelitis, right ankle and foot: Secondary | ICD-10-CM | POA: Diagnosis not present

## 2019-12-03 DIAGNOSIS — L97528 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ity: Secondary | ICD-10-CM | POA: Diagnosis not present

## 2019-12-03 DIAGNOSIS — L97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 limited to breakdown of skin: Secondary | ICD-10-CM | POA: Diagnosis not present

## 2019-12-03 LAB — GLUCOSE, CAPILLARY
Glucose-Capillary: 93 mg/dL (ref 70–99)
Glucose-Capillary: 99 mg/dL (ref 70–99)
Glucose-Capillary: 97 mg/dL (ref 70–99)
Glucose-Capillary: 98 mg/dL (ref 70–99)

## 2019-12-03 NOTE — Progress Notes (Addendum)
[name], [name] ([id number]) Visit Report for 12/03/2019 Arrival Information Details Patient Name: Date of Service: [name], [name] 12/03/2019 2:00 PM Medical Record Number:[id number] Patient Account Number: [id number] Date of Birth/Sex: Treating RN: 02/18/1941 (79 y.o. F) Primary Care Selah Zelman: [name] Other Clinician: [name] Referring Kenedee Molesky: Treating Damean Poffenberger/[name]:[name], [name], [name] Weeks in Treatment: 20 Visit Information History Since Last Visit Added or deleted any medications: No Patient Arrived: [name]: No Arrival Time: 16:38 Had a fall or experienced change in No Accompanied By: self activities of daily living that may affect Transfer Assistance: None risk of falls: Patient Identification Verified: Yes Signs or symptoms of abuse/neglect since last No Secondary Verification Process Yes visito Completed: Hospitalized since last visit: No Patient Requires Transmission- No Implantable device outside of the clinic excluding No Based Precautions: cellular tissue based products placed in the center Patient Has Alerts: Yes since last visit: Patient Alerts: Right ABI .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 [name] Primary Care Vyla Pint: [name] Other Clinician: Referring Kaidynce Pfister: Treating Rylann Munford/[name]:[name], [name], [name] Weeks in Treatment: 20 Clinic Level of Care Assessment Items TOOL 4 Quantity  Score X - Use when only an EandM is performed on FOLLOW-UP visit 1 0 ASSESSMENTS - Nursing Assessment / Reassessment X - Reassessment of Co-morbidities (includes updates in patient status) 1 10 X - Reassessment of Adherence to Treatment Plan 1 5 ASSESSMENTS - Wound and Skin Assessment / Reassessment []  - Simple Wound Assessment / Reassessment - one wound 0 []  - Complex Wound Assessment / Reassessment - multiple wounds 0 []  - Dermatologic / Skin Assessment (not related to wound area) 0 ASSESSMENTS - Focused Assessment []  - Circumferential Edema Measurements - multi extremities 0 []  - Nutritional Assessment / Counseling / Intervention 0 []  - Lower Extremity Assessment (monofilament, tuning fork, pulses) 0 []  - Peripheral Arterial Disease Assessment (using hand held doppler) 0 ASSESSMENTS - Ostomy and/or Continence Assessment and Care []  - Incontinence Assessment and Management 0 []  - Ostomy Care Assessment and Management (repouching, etc.) 0 PROCESS - Coordination of Care X - Simple Patient / Family Education for ongoing care 1 15 []  - Complex (extensive) Patient / Family Education for ongoing care 0 X - Staff obtains Programmer, systems, Records, Test Results / Process Orders 1 10 []  - Staff telephones HHA, Nursing Homes / Clarify orders / etc 0 []  - Routine Transfer to another Facility (non-emergent condition) 0 []  - Routine Hospital Admission (non-emergent condition) 0 []  - New Admissions / Biomedical engineer / Ordering NPWT, Apligraf, etc. 0 []  - Emergency Hospital Admission (emergent condition) 0 X - Simple Discharge Coordination 1 10 []  - Complex (extensive) Discharge Coordination 0 PROCESS - Special Needs []  - Pediatric / Minor Patient Management 0 []  - Isolation Patient Management 0 []  - Hearing / Language / Visual special needs 0 []  - Assessment of Community assistance (transportation, D/C planning, etc.) 0 []  - Additional assistance / Altered mentation 0 []  - Support Surface(s)  Assessment (bed, cushion, seat, etc.) 0 INTERVENTIONS - Wound Cleansing / Measurement []  - Simple Wound Cleansing - one wound 0 []  - Complex Wound Cleansing - multiple wounds 0 []  -  Wound Imaging (photographs - any number of wounds) 0 []  - Wound Tracing (instead of photographs) 0 []  - Simple Wound Measurement - one wound 0 []  - Complex Wound Measurement - multiple wounds 0 INTERVENTIONS - Wound Dressings []  - Small Wound Dressing one or multiple wounds 0 []  - Medium Wound Dressing one or multiple wounds 0 []  - Large Wound Dressing one or multiple wounds 0 []  - Application of Medications - topical 0 []  - Application of Medications - injection 0 INTERVENTIONS - Miscellaneous []  - External ear exam 0 []  - Specimen Collection (cultures, biopsies, blood, body fluids, etc.) 0 []  - Specimen(s) / Culture(s) sent or taken to Lab for analysis 0 []  - Patient Transfer (multiple staff / Civil Service fast streamer / Similar devices) 0 []  - Simple Staple / Suture removal (25 or less) 0 []  - Complex Staple / Suture removal (26 or more) 0 X - Hypo / Hyperglycemic Management (close monitor of Blood Glucose) 1 10 []  - Ankle / Brachial Index (ABI) - do not check if billed separately 0 X - Vital Signs 1 5 Has the patient been seen at the hospital within the last three years: Yes Total Score: 65 Level Of Care: New/Established - Level 2 Electronic Signature(s) Signed: 12/07/2019 5:44:18 PM By: [name] RN, BSN Entered By: [name] on 12/03/2019 16:40:54 -------------------------------------------------------------------------------- [place] Details Patient Name: Date of Service: [name] 12/03/2019 2:00 PM Medical Record Number:[id number] Patient Account Number: [id number] Date of Birth/Sex: Treating RN: 1941-09-11 (79 y.o. F) Primary Care Hermann Dottavio: [name] Other Clinician: [name] Referring Michai Dieppa: Treating Reginald Weida/[name]:[name], [name], [name] Weeks in  Treatment: 20 Vital Signs Time Taken: 14:00 Temperature (F): 97.8 Height (in): 64 Pulse (bpm): 55 Weight (lbs): 142 Respiratory Rate (breaths/min): 18 Body Mass Index (BMI): 24.4 Blood Pressure (mmHg): 117/39 Capillary Blood Glucose (mg/dl): 99 Reference Range: 80 - 120 mg / dl Notes Pt unable to complete HBO due to low CBG. 1st CBG taken was 99. Pt was given glucerna and was rechecked appx 20 min after. Pt CBG was 98. Pt given another glucerna and was rechecked with a CBG of 99. Pt then agreed to have one more glucerna. EMT/HBOT put 2 packages of sugar in with it. After recheck, pt still had a CBG of 99. Leverne Amrhein notified. Time in between rechecks appx 15-82min Electronic Signature(s) Signed: 12/03/2019 4:49:53 PM By: [name] EMT/HBOT Previous Signature: 12/03/2019 4:40:29 PM Version By: [name] EMT/HBOT Entered By: [name] on 12/03/2019 16:49:53

## 2019-12-04 ENCOUNTER — Encounter (HOSPITAL_BASED_OUTPATIENT_CLINIC_OR_DEPARTMENT_OTHER): Payer: Medicare Other | Admitting: Internal Medicine

## 2019-12-04 ENCOUNTER — Other Ambulatory Visit (HOSPITAL_COMMUNITY): Payer: Self-pay | Admitting: Cardiovascular Disease

## 2019-12-04 ENCOUNTER — Ambulatory Visit (HOSPITAL_COMMUNITY)
Admission: RE | Admit: 2019-12-04 | Discharge: 2019-12-04 | Disposition: A | Discharge status: Home / Self Care | Payer: Medicare Other | Source: Ambulatory Visit | Attending: Cardiology | Admitting: Cardiology

## 2019-12-04 DIAGNOSIS — I70229 Atherosclerosis of native arteries of extremities with rest pain, unspecified extremity: Secondary | ICD-10-CM

## 2019-12-04 DIAGNOSIS — E11621 Type 2 diabetes mellitus with foot ulcer: Secondary | ICD-10-CM | POA: Diagnosis not present

## 2019-12-04 DIAGNOSIS — I998 Other disorder of circulatory system: Secondary | ICD-10-CM

## 2019-12-04 DIAGNOSIS — L97512 Non-pressure chronic ulcer of other part of right foot with fat layer exposed: Secondary | ICD-10-CM | POA: Diagnosis not present

## 2019-12-04 DIAGNOSIS — E1142 Type 2 diabetes mellitus with diabetic polyneuropathy: Secondary | ICD-10-CM | POA: Diagnosis not present

## 2019-12-04 DIAGNOSIS — Z9862 Peripheral vascular angioplasty status: Secondary | ICD-10-CM

## 2019-12-04 DIAGNOSIS — I739 Peripheral vascular disease, unspecified: Secondary | ICD-10-CM

## 2019-12-04 DIAGNOSIS — L97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 limited to breakdown of skin: Secondary | ICD-10-CM | POA: Diagnosis not present

## 2019-12-04 DIAGNOSIS — M86371 Chronic multifocal osteomyelitis, right ankle and foot: Secondary | ICD-10-CM | POA: Diagnosis not present

## 2019-12-04 DIAGNOSIS — L97514 Non-pressure chronic ulcer of other part of right foot with necrosis of bone: Secondary | ICD-10-CM | POA: Diagnosis not present

## 2019-12-04 DIAGNOSIS — L97528 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ity: Secondary | ICD-10-CM | POA: Diagnosis not present

## 2019-12-04 LAB — GLUCOSE, CAPILLARY
Glucose-Capillary: 161 mg/dL — ABNORMAL HIGH (ref 70–99)
Glucose-Capillary: 192 mg/dL — ABNORMAL HIGH (ref 70–99)

## 2019-12-04 NOTE — Progress Notes (Signed)
[name], [name] ([id number]) Visit Report for 12/04/2019 Arrival Information Details Patient Name: Date of Service: [name], [name] 12/04/2019 2:00 PM Medical Record Number:[id number] Patient Account Number: [id number] Date of Birth/Sex: Treating RN: Jan 25, 1941 (79 y.o. [name] Primary Care Berk Pilot: [name] Other Clinician: [name] Referring Fenix Rorke: Treating Jamekia Gannett/[name]:[name], [name], [name] Weeks in Treatment: 20 Visit Information History Since Last Visit Added or deleted any medications: No Patient Arrived: [name] Any new allergies or adverse reactions: No Arrival Time: 13:40 Had a fall or experienced change in No Accompanied By: self activities of daily living that may affect Transfer Assistance: None risk of falls: Patient Identification Verified: Yes Signs or symptoms of abuse/neglect since last No Secondary Verification Process Yes visito Completed: Hospitalized since last visit: No Patient Requires Transmission- No Implantable device outside of the clinic excluding No Based Precautions: cellular tissue based products placed in the center Patient Has Alerts: Yes since last visit: Patient Alerts: Right ABI .62 TBI Pain Present Now: No refused Left ABI .60 TBI .14 Electronic Signature(s) Signed: 12/04/2019 4:47:30 PM By: [name] EMT/HBOT Entered By: [name] on 12/04/2019 14:08:42 -------------------------------------------------------------------------------- Encounter Discharge Information Details Patient Name: Date of Service: [name] 12/04/2019 2:00 PM Medical Record Number:[id number] Patient Account Number: [id number] Date of Birth/Sex: Treating RN: 1941-02-04 (79 y.o. [name] Primary Care Danie Hannig: [name] Other Clinician: [name] Referring Laiya Wisby: Treating Persais Ethridge/[name]:[name], [name], [name] Weeks in Treatment: 20 Encounter  Discharge Information Items Discharge Condition: Stable Ambulatory Status: Walker Discharge Destination: Home Transportation: Private Auto Accompanied By: self Schedule Follow-up Appointment: Yes Clinical Summary of Care: Patient Declined Electronic Signature(s) Signed: 12/04/2019 4:47:30 PM By: [name] EMT/HBOT Entered By: [name] on 12/04/2019 16:47:01 -------------------------------------------------------------------------------- Patient/Caregiver Education Details Patient Name: Date of Service: [name], [name] H. 3/12/2021andnbsp2:00 PM Medical Record Patient Account Number: [id number] Number: Treating RN: Kela Millin Date of Birth/Gender: 01-Aug-1941 (79 y.o. F) Other Clinician: [name] Primary Care Physician: Verna Czech Referring Physician: Physician/[name]: Darl Pikes in Treatment: 20 Education Assessment Education Provided To: Patient Education Topics Provided Hyperbaric Oxygenation: Methods: Explain/Verbal Responses: State content correctly Electronic Signature(s) Signed: 12/04/2019 4:47:30 PM By: [name] EMT/HBOT Entered By: [name] on 12/04/2019 16:46:46 -------------------------------------------------------------------------------- Vitals Details Patient Name: Date of Service: [name] 12/04/2019 2:00 PM Medical Record Number:[id number] Patient Account Number: [id number] Date of Birth/Sex: Treating RN: 07-05-1941 (79 y.o. [name] Primary Care Chaden Doom: [name] Other Clinician: [name] Referring Donne Robillard: Treating Elainah Rhyne/[name]:[name], [name], [name] Weeks in Treatment: 20 Vital Signs Time Taken: 13:45 Temperature (F): 97.5 Height (in): 64 Pulse (bpm): 65 Weight (lbs): 142 Respiratory Rate (breaths/min): 16 Body Mass Index (BMI): 24.4 Blood Pressure (mmHg): 140/59 Capillary Blood Glucose (mg/dl):  161 Reference Range: 80 - 120 mg / dl Electronic Signature(s) Signed: 12/04/2019 4:47:30 PM By: [name] EMT/HBOT Entered By: [name] on 12/04/2019 14:09:04

## 2019-12-04 NOTE — Progress Notes (Addendum)
[name], [name] ([id number]) Visit Report for 12/04/2019 Arrival Information Details Patient Name: Date of Service: [name], [name] 12/04/2019 4:00 PM Medical Record Number:[id number] Patient Account Number: [id number] Date of Birth/Sex: Treating RN: [birth year] (79 y.o. [name] Primary Care Kylin Dubs: [name] Other Clinician: Referring Edras Wilford: Treating Aris Moman/[name]:[name], [name], [name] Weeks in Treatment: 20 Visit Information History Since Last Visit All ordered tests and consults were completed: No Patient Arrived: [name] Added or deleted any medications: No Arrival Time: 16:25 Any new allergies or adverse reactions: No Accompanied By: self Had a fall or experienced change in No Transfer Assistance: None activities of daily living that may affect Patient Identification Verified: Yes risk of falls: Secondary Verification Process Yes Signs or symptoms of abuse/neglect since last No Completed: visito Patient Requires Transmission- No Hospitalized since last visit: No Based Precautions: Implantable device outside of the clinic excluding No Patient Has Alerts: Yes cellular tissue based products placed in the center Patient Alerts: Right ABI .62 TBI since last visit: refused Has Dressing in Place as Prescribed: Yes Left ABI .60 TBI Pain Present Now: No .14 Electronic Signature(s) Signed: 12/04/2019 5:30:50 PM By: Carlene Coria RN Entered By: Carlene Coria on 12/04/2019 16:25:55 -------------------------------------------------------------------------------- Encounter Discharge Information Details Patient Name: Date of Service: [name] 12/04/2019 4:00 PM Medical Record Number:[id number] Patient Account Number: [id number] Date of Birth/Sex: Treating RN: 11-Feb-1941 (79 y.o. [name] Primary Care Leonell Lobdell: [name] Other Clinician: Referring Chord Takahashi: Treating Anton Cheramie/[name]:[name], [name],  [name] Weeks in Treatment: 20 Encounter Discharge Information Items Post Procedure Vitals Discharge Condition: Stable Temperature (F): 97.5 Ambulatory Status: Walker Pulse (bpm): 65 Discharge Destination: Home Respiratory Rate (breaths/min): 16 Transportation: Private Auto Blood Pressure (mmHg): 140/69 Accompanied By: self Schedule Follow-up Appointment: Yes Clinical Summary of Care: Electronic Signature(s) Signed: 12/04/2019 5:45:00 PM By: Deon Pilling Entered By: Deon Pilling on 12/04/2019 17:16:08 -------------------------------------------------------------------------------- Lower Extremity Assessment Details Patient Name: Date of Service: [name], [name] 12/04/2019 4:00 PM Medical Record Number:[id number] Patient Account Number: [id number] Date of Birth/Sex: Treating RN: Dec 11, 1940 (79 y.o. [name] Primary Care Sriya Kroeze: [name] Other Clinician: Referring Mclane Arora: Treating Karelyn Brisby/[name]:[name], [name], [name] Weeks in Treatment: 20 Edema Assessment Assessed: [Left: No] [Right: No] Edema: [Left: No] [Right: No] Calf Left: Right: Point of Measurement: 39 cm From Medial Instep 30 cm 28 cm Ankle Left: Right: Point of Measurement: 10 cm From Medial Instep 20 cm 18 cm Electronic Signature(s) Signed: 12/04/2019 5:30:50 PM By: Carlene Coria RN Entered By: Carlene Coria on 12/04/2019 16:38:50 -------------------------------------------------------------------------------- Multi Wound Chart Details Patient Name: Date of Service: [name] 12/04/2019 4:00 PM Medical Record Number:[id number] Patient Account Number: [id number] Date of Birth/Sex: Treating RN: 04/09/1941 (79 y.o. 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3x1x0.1] [8:0x0x0] (cm) Area (cm) : [1:0] [7:0.236] [8:0] Volume (cm) : [1:0] [7:0.024] [8:0] % Reduction in Area: [1:100.00%] [7:97.70%] [8:100.00%] % Reduction in Volume: 100.00% [7:97.70%] [8:100.00%] Classification: [1:Grade 2] [7:Grade 4] [8:Grade 2] Exudate Amount: [1:None Present] [7:Medium] [8:None Present] Exudate Type: [1:N/A] [7:Serosanguineous] [8:N/A] Exudate Color: [1:N/A] [7:red, brown] [8:N/A] Wound Margin: [1:Distinct, outline attached Well defined, not attached Distinct, outline attached] Granulation Amount: [1:None Present (0%)] [7:Medium (34-66%)] [8:None Present (0%)] Granulation Quality: [1:N/A] [7:Pink] [8:N/A] Necrotic Amount: [1:None Present (0%)] [7:Medium (34-66%)] [8:None Present (0%)] Exposed Structures: [1:Fascia: No Fat Layer (Subcutaneous Tissue) Exposed: Yes Tissue) Exposed: No Tendon: No Muscle: No Joint: No Bone: No]  [7:Fat Layer (Subcutaneous Fascia: No Fascia: No Tendon: No Muscle: No Joint: No Bone: No] [8:Fat Layer (Subcutaneous Tissue)  Exposed: No Tendon: No Muscle: No Joint: No Bone: No] Epithelialization: [1:None] [7:Small (1-33%)] [8:Large (67-100%)] Debridement: [1:N/A] [7:Debridement - Selective/Open Wound] [8:N/A] Pre-procedure [1:N/A] [7:17:00] [8:N/A] Verification/Time Out Taken: Pain Control: [1:N/A] [7:Other] [8:N/A] Level: [1:N/A] [7:Skin/Dermis] [8:N/A] Debridement Area (sq cm):N/A [7:0.3] [8:N/A] Instrument: [1:N/A] [7:Curette] [8:N/A] Bleeding: [1:N/A] [7:None] [8:N/A] Procedural Pain: [1:N/A] [7:0] [8:N/A] Post Procedural Pain: [1:N/A] [7:0] [8:N/A] Debridement Treatment N/A [7:Procedure was tolerated] [8:N/A] Response: [7:well] Post Debridement [1:N/A] [7:0.3x1x0.1] [8:N/A] Measurements L x W x D (cm) Post Debridement [1:N/A] [7:0.024] [8:N/A] Volume: (cm) Procedures Performed: N/A [7:Debridement] [8:N/A] Treatment Notes 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 Cleanse With Wound Cleanser Soap and water 3. Primary Dressing Applied Collegen AG Hydrogel or K-Y Jelly 4. Secondary Dressing ABD Pad Roll Gauze 5. Secured With Medipore tape 7. Footwear/Offloading device applied Removable Cast Scientific laboratory technician) Signed: 12/07/2019 8:43:01 AM By: [name] MD Signed: 12/07/2019 5:25:45 PM By: Kela Millin Entered By: [name] on 12/05/2019 11:49:17 -------------------------------------------------------------------------------- Multi-Disciplinary Care Plan Details Patient Name: Date of Service: [name], [name] 12/04/2019 4:00 PM Medical Record Number:[id number] Patient Account Number: [id number] Date of Birth/Sex: Treating RN: 1941/04/27 (79 y.o. 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 Potential for barotraumas to ears, sinuses, teeth, and lungs or cerebral gas embolism related to changes in atmospheric pressure inside hyperbaric oxygen chamber Goals: Barotrauma will be prevented during HBO2 Date Initiated: 10/01/2019 Target Resolution Date: 01/08/2020 Goal Status: Active Patient and/or family will be able to state/discuss factors appropriate to the management of their disease process during treatment Date Initiated: 10/01/2019 Target Resolution Date: 01/08/2020 Goal Status: Active Patient/caregiver will verbalize understanding of HBO goals, rationale, procedures and potential hazards Date Initiated: 10/01/2019 Date Inactivated: 10/22/2019 Target Resolution Date: 10/30/2019 Goal Status: Met Interventions: Assess and provide for patients comfort related to the hyperbaric environment and equalization of middle ear Assess patient for any history of confinement anxiety Notes: Abuse / Safety / Falls / Self Care Management Nursing Diagnoses: Potential for falls Goals: Patient will remain injury free related to falls Date Initiated: 08/27/2019 Target Resolution Date: 01/08/2020 Goal Status: Active Interventions: Provide education on fall prevention Notes: Pain, Acute or Chronic Nursing Diagnoses: Pain, acute or chronic: actual or potential Potential alteration in comfort, pain Goals: Patient will verbalize adequate pain control and receive pain control interventions during procedures as needed Date Initiated: 08/27/2019 Target Resolution Date: 01/08/2020 Goal Status: Active Interventions: Encourage patient to take pain  medications as prescribed Provide education on pain management Reposition patient for comfort Notes: Electronic Signature(s) Signed: 12/04/2019 5:31:36 PM By: Kela Millin Entered By: Kela Millin on  12/04/2019 16:47:37 -------------------------------------------------------------------------------- Pain Assessment Details Patient Name: Date of Service: [name], [name] 12/04/2019 4:00 PM Medical Record Number:[id number] Patient Account Number: [id number] Date of Birth/Sex: Treating RN: [birth year] (80 y.o. [name] Primary Care Dara Camargo: [name] Other Clinician: Referring Lakeisha Waldrop: Treating Sahith Nurse/[name]:[name], [name], [name] Weeks in Treatment: 20 Active Problems Location of Pain Severity and Description of Pain Patient Has Paino No Site Locations Pain Management and Medication Current Pain Management: Electronic Signature(s) Signed: 12/04/2019 5:30:50 PM By: Carlene Coria RN Entered By: Carlene Coria on 12/04/2019 16:26:35 -------------------------------------------------------------------------------- Patient/Caregiver Education Details Patient Name: Date of Service: [name], [name] H. 3/12/2021andnbsp4:00 PM Medical Record Number:[id number] Patient Account Number: [id number] Date of Birth/Gender: Treating RN: 1941-01-08 (79 y.o. [name] Primary Care Physician: [name] Other Clinician: Referring Physician: Treating Physician/[name]:[name], [name], [name] in Treatment: 20 Education Assessment Education Provided To: Patient Education Topics Provided Pain: Methods: Explain/Verbal Responses: State content correctly Safety: Methods: Explain/Verbal Responses: State content correctly Electronic Signature(s) Signed: 12/04/2019 5:31:36 PM By: Kela Millin Entered By: Kela Millin on 12/04/2019 16:48:05 -------------------------------------------------------------------------------- Wound Assessment Details Patient Name: Date of Service: [name] 12/04/2019 4:00 PM Medical Record Number:[id number] Patient Account Number: [id number] Date of Birth/Sex: Treating RN: 02-Jan-1941 (79  y.o. [name] Primary Care Arath Kaigler: [name] Other Clinician: Referring Ashur Glatfelter: Treating Drew Herman/[name]:[name], [name], [name] Weeks in Treatment: 20 Wound Status Wound Number: 1 Primary Diabetic Wound/Ulcer of the Lower Extremity Etiology: Wound Location: Left Toe Fourth - Dorsal Secondary Arterial Insufficiency Ulcer Wounding Event: Blister Etiology: Date Acquired: 09/16/2018 Wound Healed - Epithelialized Weeks Of Treatment: 20 Status: Clustered Wound: No Comorbid Deep Vein Thrombosis, Hypertension, History: Peripheral Arterial Disease, Type II Diabetes Photos Wound Measurements Length: (cm) 0 % Reduct Width: (cm) 0 % Reduct Depth: (cm) 0 Epitheli Area: (cm) 0 Tunneli Volume: (cm) 0 Undermi Wound Description Classification: Grade 2 Foul Odor Wound Margin: Distinct, outline attached Slough/Fi Exudate Amount: None Present Wound Bed Granulation Amount: None Present (0%) Necrotic Amount: None Present (0%) Fascia Exp Fat Layer Tendon Exp Muscle Exp Joint Expo Bone Expos After Cleansing: No brino No Exposed Structure osed: No (Subcutaneous Tissue) Exposed: No osed: No osed: No sed: No ed: No ion in Area: 100% ion in Volume: 100% alization: None ng: No ning: No Electronic Signature(s) Signed: 12/08/2019 4:45:30 PM By: [name] EMT/HBOT Signed: 12/08/2019 5:07:08 PM By: Kela Millin Previous Signature: 12/04/2019 5:31:36 PM Version By: Kela Millin Entered By: [name] on 12/08/2019 13:33:47 -------------------------------------------------------------------------------- Wound Assessment Details Patient Name: Date of Service: [name] 12/04/2019 4:00 PM Medical Record Number:[id number] Patient Account Number: [id number] Date of Birth/Sex: Treating RN: [birth year] (79 y.o. [name] Primary Care Rolf Fells: [name] Other Clinician: Referring Sula [name]: Treating  Trivia Heffelfinger/[name]:[name], [name], [name] Weeks in Treatment: 20 Wound Status Wound Number: 7 Primary Diabetic Wound/Ulcer of the Lower Extremity Etiology: Wound Location: Right Amputation Site - Transmetatarsal Wound Open Status: Wounding Event: Surgical Injury Comorbid Deep Vein Thrombosis, Hypertension, Date Acquired: 08/07/2019 History: Peripheral Arterial Disease, Type II Diabetes Weeks Of Treatment: 14 Clustered Wound: No Photos Wound Measurements Length: (cm) 0.3 Width: (cm) 1 Depth: (cm) 0.1 Area: (cm) 0.236 Volume: (cm) 0.024 Wound Description Classification: Grade 4 Wound Margin: Well defined, not attached Exudate Amount: Medium Exudate Type: Serosanguineous Exudate Color: red, brown Wound Bed Granulation Amount: Medium (34-66%) Granulation Quality: Pink  Necrotic Amount: Medium (34-66%) Necrotic Quality: Adherent Slough After Cleansing: No brino Yes Exposed Structure posed: No (Subcutaneous Tissue) Exposed: Yes posed: No posed: No osed: No sed: No % Reduction in Area: 97.7% % Reduction in Volume: 97.7% Epithelialization: Small (1-33%) Tunneling: No Undermining: No Foul Odor Slough/Fi Fascia Ex Fat Layer Tendon Ex Muscle Ex Joint Exp Bone Expo Treatment Notes 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 Cleanse With Wound Cleanser Soap and water 3. Primary Dressing Applied Collegen AG Hydrogel or K-Y Jelly 4. Secondary Dressing ABD Pad Roll Gauze 5. Secured With Medipore tape 7. Footwear/Offloading device applied Removable Cast Scientific laboratory technician) Signed: 12/08/2019 4:45:30 PM By: [name] EMT/HBOT Signed: 12/08/2019 5:07:08 PM By: Kela Millin Previous Signature: 12/04/2019 5:30:50 PM Version By: Carlene Coria RN Entered By: [name] on 12/08/2019 13:34:16 -------------------------------------------------------------------------------- Wound Assessment Details Patient  Name: Date of Service: [name] 12/04/2019 4:00 PM Medical Record Number:[id number] Patient Account Number: [id number] Date of Birth/Sex: Treating RN: Jul 16, 1941 (79 y.o. [name] Primary Care Sahiti Joswick: [name] Other Clinician: Referring Nils Thor: Treating Khizar Fiorella/[name]:[name], [name], [name] Weeks in Treatment: 20 Wound Status Wound Number: 8 Primary Diabetic Wound/Ulcer of the Lower Extremity Etiology: Wound Location: Left Toe Third Wound Healed - Epithelialized Wounding Event: Gradually Appeared Status: Date Acquired: 09/10/2019 Comorbid Deep Vein Thrombosis, Hypertension, Weeks Of Treatment: 12 History: Peripheral Arterial Disease, Type II Diabetes Clustered Wound: No Photos Wound Measurements Length: (cm) 0 % Reductio Width: (cm) 0 % Reductio Depth: (cm) 0 Epithelial Area: (cm) 0 Tunneling Volume: (cm) 0 Undermini Wound Description Classification: Grade 2 Foul Odor Wound Margin: Distinct, outline attached Slough/Fi Exudate Amount: None Present Wound Bed Granulation Amount: None Present (0%) Necrotic Amount: None Present (0%) Fascia Exp Fat Layer Tendon Exp Muscle Exp Joint Expo Bone Expos After Cleansing: No brino No Exposed Structure osed: No (Subcutaneous Tissue) Exposed: No osed: No osed: No sed: No ed: No n in Area: 100% n in Volume: 100% ization: Large (67-100%) : No ng: No Electronic Signature(s) Signed: 12/08/2019 4:45:30 PM By: [name] EMT/HBOT Signed: 12/08/2019 5:07:08 PM By: Kela Millin Previous Signature: 12/04/2019 5:31:36 PM Version By: Kela Millin Entered By: [name] on 12/08/2019 11:41:30 -------------------------------------------------------------------------------- Vitals Details Patient Name: Date of Service: [name] 12/04/2019 4:00 PM Medical Record Number:[id number] Patient Account Number: [id number] Date of Birth/Sex: Treating RN: 1940-12-24 (79  y.o. [name] Primary Care Khanh Cordner: [name] Other Clinician: Referring Jenalyn Girdner: Treating Santia Labate/[name]:[name], [name], [name] Weeks in Treatment: 20 Vital Signs Time Taken: 16:26 Temperature (F): 97.5 Height (in): 64 Pulse (bpm): 65 Weight (lbs): 142 Respiratory Rate (breaths/min): 16 Body Mass Index (BMI): 24.4 Blood Pressure (mmHg): 140/69 Reference Range: 80 - 120 mg / dl Electronic Signature(s) Signed: 12/04/2019 5:30:50 PM By: Carlene Coria RN Entered By: Carlene Coria on 12/04/2019 16:26:20

## 2019-12-04 NOTE — Progress Notes (Addendum)
[name], [name] ([id number]) Visit Report for 12/04/2019 HBO Details Patient Name: Date of Service: [name], [name] 12/04/2019 2:00 PM Medical Record Number:[id number] Patient Account Number: [id number] Date of Birth/Sex: Treating RN: 08/07/1941 (79 y.o. Hollie Salk, Shannon Primary Care Sergei Delo: [name] Other Clinician: [name] Referring Bastion Bolger: Treating Neema Barreira/[name]:[name], [name], [name] Weeks in Treatment: 20 HBO Treatment Course Details Treatment Course Number: 1 Ordering Finnian Husted: [name] Total Treatments Ordered: 40 HBO Treatment Start Date: 10/12/2019 HBO Indication: Diabetic Ulcer(s) of the Lower Extremity HBO Treatment Details Treatment Number: 32 Patient Type: Outpatient Chamber Type: Monoplace Chamber Serial #: [id number] Treatment Protocol: 2.5 ATA with 90 minutes oxygen, with two 5 minute air breaks Treatment Details Compression Rate Down: 2.0 psi / minute De-Compression Rate Up: 2.0 psi / minute Air breaks and CompressTx Pressure breathing periods DecompressDecompress Begins Reached (leave unused spaces Begins Ends blank) Chamber Pressure (ATA)1 2.5 2.5 2.5 2.5 2.5 --2.5 1 Clock Time (24 hr) 14:00 14:12 [id number]:4214:[id number]:1715:22--15:52 16:04 Treatment Length: 124 (minutes) Treatment Segments: 4 Vital Signs Capillary Blood Glucose Reference Range: 80 - 120 mg / dl HBO Diabetic Blood Glucose Intervention Range: <131 mg/dl or >249 mg/dl Time Vitals Blood Respiratory Capillary Blood Glucose Pulse Action Type: Pulse: Temperature: Taken: Pressure: Rate: Glucose (mg/dl): Meter #: Oximetry (%) Taken: Pre 13:45 140/59 65 16 97.5 161 Post 16:07 139/35 67 16 98 192 Treatment Response Treatment Toleration: Well Treatment Completion Treatment Completed without Adverse Event Status: Additional Procedure Documentation Tissue Sevierity: Necrosis of bone Saloni Lablanc Notes No concerns with treatment given. Patient was also seen for  wound care evaluation Physician HBO Attestation: I certify that I supervised this HBO treatment in accordance with Medicare guidelines. A trained Yes emergency response team is readily available per hospital policies and procedures. Continue HBOT as ordered. Yes Electronic Signature(s) Signed: 12/07/2019 8:43:01 AM By: [name] MD Previous Signature: 12/04/2019 4:47:30 PM Version By: [name] EMT/HBOT Entered By: [name] on 12/04/2019 17:29:48 -------------------------------------------------------------------------------- HBO Safety Checklist Details Patient Name: Date of Service: [name] 12/04/2019 2:00 PM Medical Record Number:[id number] Patient Account Number: [id number] Date of Birth/Sex: Treating RN: 1940-10-22 (79 y.o. Hollie Salk, Shannon Primary Care Keyry Iracheta: [name] Other Clinician: [name] Referring Dalisa Forrer: Treating Loral Campi/[name]:[name], [name], [name] Weeks in Treatment: 20 HBO Safety Checklist Items Safety Checklist Consent Form Signed Patient voided / foley secured and emptied When did you last eato 1200 - chef salad / PBandJ sammy Last dose of injectable or oral agent metformin NA Ostomy pouch emptied and vented if applicable All implantable devices assessed, documented and approved Intravenous access site secured and place PICC line left arm Valuables secured Linens and cotton and cotton/polyester blend (less than 51% polyester) Personal oil-based products / skin lotions / body lotions removed Wigs or hairpieces removed NA Smoking or tobacco materials removed Books / newspapers / magazines / loose paper removed Cologne, aftershave, perfume and deodorant removed Jewelry removed (may wrap wedding band) Make-up removed Hair care products removed NA Battery operated devices (external) removed NA Heating patches and chemical warmers removed NA Titanium eyewear removed NA Nail polish cured greater  than 10 hours NA Casting material cured greater than 10 hours NA Hearing aids removed NA Loose dentures or partials removed NA Prosthetics have been removed Patient demonstrates correct use of air break device (if applicable) Patient concerns have been addressed Patient grounding bracelet on and cord attached to chamber Specifics for Inpatients (complete in addition to above) Medication sheet sent with patient Intravenous medications needed or due during  therapy sent with patient Drainage tubes (e.g. nasogastric tube or chest tube secured and vented) Endotracheal or Tracheotomy tube secured Cuff deflated of air and inflated with saline Airway suctioned Electronic Signature(s) Signed: 12/04/2019 2:10:13 PM By: [name] EMT/HBOT Entered By: [name] on 12/04/2019 14:10:13

## 2019-12-07 ENCOUNTER — Other Ambulatory Visit: Payer: Self-pay

## 2019-12-07 ENCOUNTER — Encounter (HOSPITAL_BASED_OUTPATIENT_CLINIC_OR_DEPARTMENT_OTHER): Payer: Medicare Other | Admitting: Internal Medicine

## 2019-12-07 DIAGNOSIS — L97528 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ity: Secondary | ICD-10-CM | POA: Diagnosis not present

## 2019-12-07 DIAGNOSIS — I10 Essential (primary) hypertension: Secondary | ICD-10-CM | POA: Diagnosis not present

## 2019-12-07 DIAGNOSIS — L97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 limited to breakdown of skin: Secondary | ICD-10-CM | POA: Diagnosis not present

## 2019-12-07 DIAGNOSIS — Z4781 Encounter for orthopedic aftercare following surgical amputation: Secondary | ICD-10-CM | POA: Diagnosis not present

## 2019-12-07 DIAGNOSIS — E1142 Type 2 diabetes mellitus with diabetic polyneuropathy: Secondary | ICD-10-CM | POA: Diagnosis not present

## 2019-12-07 DIAGNOSIS — E1152 Type 2 diabetes mellitus with diabetic peripheral angiopathy with gangrene: Secondary | ICD-10-CM | POA: Diagnosis not present

## 2019-12-07 DIAGNOSIS — M86371 Chronic multifocal osteomyelitis, right ankle and foot: Secondary | ICD-10-CM | POA: Diagnosis not present

## 2019-12-07 DIAGNOSIS — E11621 Type 2 diabetes mellitus with foot ulcer: Secondary | ICD-10-CM | POA: Diagnosis not present

## 2019-12-07 DIAGNOSIS — L97514 Non-pressure chronic ulcer of other part of right foot with necrosis of bone: Secondary | ICD-10-CM | POA: Diagnosis not present

## 2019-12-07 DIAGNOSIS — D649 Anemia, unspecified: Secondary | ICD-10-CM | POA: Diagnosis not present

## 2019-12-07 LAB — GLUCOSE, CAPILLARY
Glucose-Capillary: 205 mg/dL — ABNORMAL HIGH (ref 70–99)
Glucose-Capillary: 148 mg/dL — ABNORMAL HIGH (ref 70–99)

## 2019-12-07 NOTE — Progress Notes (Signed)
TANEKA, ESPIRITU ([id number]) Visit Report for 12/07/2019 Arrival Information Details Patient Name: Date of Service: TASHEMA, TILLER 12/07/2019 2:00 PM Medical Record Number:[id number] Patient Account Number: [id number] Date of Birth/Sex: Treating RN: 23-Jul-1941 (79 y.o. [name] Primary Care Maysun Meditz: [name] Other Clinician: [name] Referring Sterlin Knightly: Treating Glori Machnik/[name]:[name], [name], [name] Weeks in Treatment: 21 Visit Information History Since Last Visit Added or deleted any medications: No Patient Arrived: [name] Any new allergies or adverse reactions: No Arrival Time: 14:00 Had a fall or experienced change in No Accompanied By: self activities of daily living that may affect Transfer Assistance: None risk of falls: Patient Identification Verified: Yes Signs or symptoms of abuse/neglect since last No Secondary Verification Process Yes visito Completed: Hospitalized since last visit: No Patient Requires Transmission- No Implantable device outside of the clinic excluding No Based Precautions: cellular tissue based products placed in the center Patient Has Alerts: Yes since last visit: Patient Alerts: Right ABI .62 TBI Pain Present Now: No refused Left ABI .60 TBI .14 Electronic Signature(s) Signed: 12/07/2019 5:11:29 PM By: [name] EMT/HBOT Entered By: [name] on 12/07/2019 14:27:48 -------------------------------------------------------------------------------- Encounter Discharge Information Details Patient Name: Date of Service: [name] 12/07/2019 2:00 PM Medical Record Number:[id number] Patient Account Number: [id number] Date of Birth/Sex: Treating RN: Feb 03, 1941 (79 y.o. [name] Primary Care Ellanora Rayborn: [name] Other Clinician: [name] Referring Madonna Flegal: Treating Denessa Cavan/[name]:[name], [name], [name] in Treatment: 21 Encounter Discharge  Information Items Discharge Condition: Stable Ambulatory Status: Cane Discharge Destination: Home Transportation: Private Auto Accompanied By: self Schedule Follow-up Appointment: Yes Clinical Summary of Care: Patient Declined Electronic Signature(s) Signed: 12/07/2019 5:11:29 PM By: [name] EMT/HBOT Entered By: [name] on 12/07/2019 16:59:36 -------------------------------------------------------------------------------- Patient/Caregiver Education Details Patient Name: Date of Service: Rathod, Aerika H. 3/15/2021andnbsp2:00 PM Medical Record Patient Account Number: [id number] Number: Treating RN: [name] Date of Birth/Gender: 1941/03/04 (78 y.o. F) Other Clinician: [name] Primary Care Physician: Verna Czech Referring Physician: Physician/[name]: Darl Pikes in Treatment: 21 Education Assessment Education Provided To: Patient Education Topics Provided Hyperbaric Oxygenation: Methods: Explain/Verbal Responses: State content correctly Electronic Signature(s) Signed: 12/07/2019 5:11:29 PM By: [name] EMT/HBOT Entered By: [name] on 12/07/2019 16:59:23 -------------------------------------------------------------------------------- Vitals Details Patient Name: Date of Service: [name] 12/07/2019 2:00 PM Medical Record Number:[id number] Patient Account Number: [id number] Date of Birth/Sex: Treating RN: 01/26/1941 (79 y.o. [name] Primary Care Sherwin Hollingshed: [name] Other Clinician: [name] Referring Lucindia Lemley: Treating Inas Avena/[name]:[name], [name], [name] Weeks in Treatment: 21 Vital Signs Time Taken: 14:05 Temperature (F): 98.2 Height (in): 64 Pulse (bpm): 61 Weight (lbs): 142 Respiratory Rate (breaths/min): 17 Body Mass Index (BMI): 24.4 Blood Pressure (mmHg): 132/59 Capillary Blood Glucose (mg/dl): 205 Reference  Range: 80 - 120 mg / dl Electronic Signature(s) Signed: 12/07/2019 5:11:29 PM By: [name] EMT/HBOT Entered By: [name] on 12/07/2019 14:28:04

## 2019-12-07 NOTE — Progress Notes (Signed)
[name], [name] ([id number]) Visit Report for 12/04/2019 Debridement Details Patient Name: Date of Service: [name], [name] 12/04/2019 4:00 PM Medical Record Number:[id number] Patient Account Number: [id number] Date of Birth/Sex: Treating RN: 01/16/1941 (79 y.o. [name] Primary Care Provider: [name] Other Clinician: Referring Provider: Treating Provider/[name]:Raigan Baria, [name], [name] Weeks in Treatment: 20 Debridement Performed for Wound #7 Right Amputation Site - Transmetatarsal Assessment: Performed By: Physician Ricard Dillon., MD Debridement Type: Debridement Severity of Tissue Pre Fat layer exposed Debridement: Level of Consciousness (Pre- Awake and Alert procedure): Pre-procedure Verification/Time Out Taken: Yes - 17:00 Start Time: 17:00 Pain Control: Other : benzocaine, 20% Total Area Debrided (L x W): 0.3 (cm) x 1 (cm) = 0.3 (cm) Tissue and other material Viable, Non-Viable, Callus, Eschar, Skin: Dermis debrided: Level: Skin/Dermis Debridement Description: Selective/Open Wound Instrument: Curette Bleeding: None End Time: 17:01 Procedural Pain: 0 Post Procedural Pain: 0 Response to Treatment: Procedure was tolerated well Level of Consciousness Awake and Alert (Post-procedure): Post Debridement Measurements of Total Wound Length: (cm) 0.3 Width: (cm) 1 Depth: (cm) 0.1 Volume: (cm) 0.024 Character of Wound/Ulcer Post Improved Debridement: Severity of Tissue Post Debridement: Fat layer exposed Post Procedure Diagnosis Same as Pre-procedure Electronic Signature(s) Signed: 12/07/2019 8:43:01 AM By: [name] MD Signed: 12/07/2019 5:25:45 PM By: Kela Millin Previous Signature: 12/04/2019 5:31:36 PM Version By: Kela Millin Entered By: [name] on 12/05/2019 11:49:43 -------------------------------------------------------------------------------- HPI Details Patient Name: Date of Service: [name] 12/04/2019 4:00 PM Medical Record Number:[id number] Patient Account Number: [id number] Date of Birth/Sex: Treating RN: Sep 17, 1941 (79 y.o. [name] Primary Care Provider: [name] Other Clinician: Referring Provider: Treating Provider/[name]:[name], [name], [name] Weeks in Treatment: 20 History of Present Illness HPI Description: ADMISSION 07/13/2019 Patient is a 79 year old type II diabetic. She has known PAD. She has been followed by Dr. Jacqualyn Posey of podiatry for blistering areas on her toes dating back to 04/30/2019 which at this was the left fourth toe. By 8/11 she had wounds on the right and left second toes. She underwent arterial studies by Dr. Alvester Chou on 7/31 that showed ABIs on the right of 0.60 TBI of 0.26 on the left ABI of 0.56 and a TBI of 0.25. By 9/10 she had ischemic-looking wounds per Dr. Earleen Newport on the right first, left first second and third. She has been using Medihoney and then mupirocin more recently simply Betadine. The patient underwent angiography by Dr. Gwenlyn Found on 9/21. 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 Noted that there was 1 vessel runoff in the foot via the peroneal. On the left there was an 80% left SFA, 70% mid left SFA. There was a short segment calcified CTO distal less than SFA and above-knee popliteal artery reconstituting with two-vessel runoff. 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 It was felt that she had bilateral SFA disease as well as tibial vessel disease. An attempt was made to revascularize the left SFA but they were unable to cross because of the highly calcified nature of the lesion. 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 She has an area on the medial left  fourth toe with raised horned callus on top of this. I am not certain what this represents. With regards to pain she has about  a 2-1/2 I will claudication tolerance in the grocery store. She has some pain in night which is relieved by putting her feet down over the side of the bed. She is wearing Nitro-Dur patches on the top of her right foot. Past medical history includes type 2 diabetes with secondary PAD, neoplasm of the skin, diabetic retinopathy, carotid artery stenosis, hyperlipidemia hypertension. 11/2; the last time the patient was here I spoke to Dr. Gwenlyn Found about revascularization options on the right. As I understand things currently Dr. Gwenlyn Found spoke with Dr. Trula Slade and ultimately the patient was taken to surgery on 10/27. She had a right iliofemoral endarterectomy with a bovine patch angioplasty. I think the plan now is for her to have a staged intervention on the right SFA by Dr. Alvester Chou. Per the patient's understanding Dr. Gwenlyn Found and Dr. Jacqualyn Posey are waiting to see when the gangrenous toes on the right foot can be amputated. The patient states her pain is a lot better and she is grateful for that. 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 Small area on the left fourth toe. She is using Betadine to the mummified areas on the right and Medihoney on the left 08/27/2019; the last time I saw this patient I discharged her from the clinic. She had been revascularized by Dr. Trula Slade and she had a right iliofemoral endarterectomy with a bovine angioplasty. 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 Jacqualyn Posey of podiatry on 08/07/2019. I note that she also had a intervention by Dr. Gwenlyn Found and he performed a directional atherectomy and drug-coated balloon angioplasty of the SFA and popliteal artery on the right. I am not certain of the date of Dr. Kennon Holter procedure as of the time of this dictation. She was referred back  to [place] by Dr. Earleen Newport predominantly for follow-up of the left fourth toe. She still has sutures and stitches in the right TMA site. She states her pain is a lot better. She expresses concern about the condition of the amputation site at the TMA. She is on doxycycline I think prescribed by Dr. Earleen Newport. She is complaining of some pain at night 12/10; I spoke to Dr. Jacqualyn Posey last week. He removed the sutures on the right foot on Monday of this week. She has the area on the left fourth toe just proximal to the PIP and then the right TMA site. She is still on doxycycline and has enough through next week. Unfortunately the TMA site does not look good at all. Both on the lateral and medial part of the incisions are areas that probe to bone. There is purulence over the medial part which I have cultured. We will use silver alginate. Left fourth toe looks somewhat better but there was still exposed bone 12/17; patient has an MRI booked for 12/30. Culture I did last week showed Pseudomonas Serratia and Enterococcus. This was purulent drainage coming out of the medial part of her amputation site. I use cefdinir 300 twice daily for 10 days that started on 12/15. Her x-ray on the right showed limited evaluation for osteomyelitis. The findings could have been postoperative. There was subtle erosion in the distal first and distal fifth metatarsal. An MRI was suggested. On the left she had irregularity of the fourth middle and proximal phalanx consistent with a history of osteomyelitis. I do not know that she has a history of osteomyelitis in this area. She had a newly defined area  on the plantar third toe 12/31; patient's MRI is listed below: MRI OF THE RIGHT FOREFOOT WITHOUT AND WITH CONTRAST TECHNIQUE: Multiplanar, multisequence MR imaging of the right forefoot was performed before and after the administration of intravenous contrast. CONTRAST: 6 mL GADAVIST IV SOLN COMPARISON: Plain films right foot  09/04/2019. FINDINGS: Bones/Joint/Cartilage The patient is status post transmetatarsal amputation as seen on the prior exam. Marrow edema and enhancement are seen in all of the distal metatarsals. In the first metatarsal, signal change extends 1.5 cm proximal to the stump and in the second metatarsal extends approximately 2 cm proximal to the stump. Edema and enhancement are seen in only the distal 0.7 cm of the third metatarsal stump and tips of the fourth and fifth metatarsals. Bone marrow signal is otherwise unremarkable. A small focus of subchondral edema is seen in the lateral talus, likely degenerative. Ligaments Intact. Muscles and Tendons No intramuscular fluid collection. Soft tissues Skin ulceration is seen off the stump of the first metatarsal. A thin fluid collection tracks deep to the wound and over the anterior metatarsals worrisome for small abscess. Intense subcutaneous edema and enhancement are seen diffusely. 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 Cellulitis about the foot. Skin ulceration over the distal first metatarsal is identified with a thin fluid collection tracking anteriorly along the stump worrisome for abscess. Small focus of subchondral edema in the lateral talus is likely degenerative. Electronically Signed By: Inge Rise M.D. On: 09/23/2019 15:25 Patient arrives in clinic today not complaining any of any pain. 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 She does not describe claudication however her activity is very limited. 10/01/2019; since the patient was last here I have communicated with Dr. Gwenlyn Found after bypass by Dr. Trula Slade and addressing the right superficial femoral artery he states that she is widely patent through  peroneal arteries to the ankle with collaterals to the dorsalis pedis. He states he is going to talk to colleagues about the feasibility of tibial pedal access. The patient seems infectious disease later this afternoon Dr. Baxter Flattery. in preparation for this she has been off antibiotics for 1 week and I went ahead and obtain pieces of the remanence of her first metatarsal for pathology and CandS. The patient is a candidate for hyperbaric oxygen. 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 1/15; considerable improvement in the wounds on her feet. We are using silver collagen. She follows with Dr. Baxter Flattery of infectious disease and is on meropenem and daptomycin. She has been taught how to do this herself at home. Follows with Dr. Graylon Good at the end of treatment here. She has 2 wounds on the surgical TMA site 1 lateral and 1 medial the lateral 1 has regressed tremendously. The area medially still has some exposed bone although the base of this looks healthy. 1/22; 2 separate wounds on the surgical TMA site. Both of these look satisfactory. The medial area does not have any exposed bone. This is an improvement On the left side fourth toe dorsally over the proximal phalanx there is a deep punched out area that probes to bone. She has an area on the tip of the left third toe. She also tells [place] that in HBO she traumatized her left Achilles and this is left her with a superficial wound area  1/28; weekly visit along with HBO. She has 5 wounds. To our punched out areas on the original TMA site on the right. Both of these appear to have contracted. The area on the right no longer has exposed bone. She has an area on the tip of the left third toe and on the DIP of the left fourth toe. Both of these had surface callus that I removed and unfortunately they have small areas that both go to bone. She has a traumatic wound on the left Achilles area that happened in HBO and  that appears better. She is tolerating her IV antibiotics well at home. She has home health changing the dressings and she is doing it once on the weekends. We have been using silver collagen. She has been extensively revascularized on the right by Dr. Trula Slade and subsequently by Dr. Gwenlyn Found. 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 Baxter Flattery of infectious disease on 2/17. Dr. Gwenlyn Found on 2/23 and according to her her antibiotics stopped on 2/23. She is tolerating hyperbarics well. She has made a tremendous improvement in the right forefoot with only 2 smaller open wounds. Using silver collagen. On the left foot she has the area on the tip of the left third toe and the medial part of the left fourth toe. These had exposed bone last week I did not sense any of that today 2/12; sees Dr. Graylon Good on 2/17 and Dr. Gwenlyn Found on 2/23. We are going to use Dermagraft on this today however the lateral part of her train TMA incision on the right is healed and the medial part is down so much that we just continue with the silver collagen She has wounds on the tip of her left third and the medial aspect of the left fourth. Both of these still have exposed bone I have not been able to get these to epithelialize. 2/19; she sees Dr. Gwenlyn Found on 2/23 and I have communicated with him about the left vascular supply. Looking at her angiogram from 08/03/2019 it looks as though that they could not cross the left CFA. Noted that she had one-vessel runoff bilaterally. She also apparently saw Dr. Graylon Good although I did not look up these results for preparation of this record. 2/26; Dr. Gwenlyn Found is going to do an angiogram next week on Wednesday. I think they are going to try to go at this both retrograde and anterograde to see if anything can be done to revascularize the left lower limb. She continues to make  nice progress on the remaining wound on the right medial foot on her TMA site and the toe it wounds on the left are responding nicely as well 3/12; the patient underwent an extensive and complicated revascularization on the left leg by Dr. Fletcher Anon on 3/3; she had an atherectomy on occluded left popliteal artery; anterior tibial artery followed by a balloon angioplasty. Atherectomy was also performed to the left SFA because there was still significant 50% stenosis in the left popliteal artery they performed an intravascular lithotripsy which improved the residual stenosis to 20%. The same lithotripsy was used to dilate the proximal left SFA. She had a drug-eluting stent placed I believe in the SFA. The patient returned for hyperbarics this week. She has had some eye problems on the right which she tells me are secondary to diabetic retinopathy and she  saw her eye doctor. She is really made excellent progress there is no open wounds on the left foot at all. She has one open area medially and her TMA site on the right lateral wound is closed. Electronic Signature(s) Signed: 12/07/2019 8:43:01 AM By: [name] MD Entered By: [name] on 12/05/2019 11:54:01 -------------------------------------------------------------------------------- Physical Exam Details Patient Name: Date of Service: [name] 12/04/2019 4:00 PM Medical Record Number:[id number] Patient Account Number: [id number] Date of Birth/Sex: Treating RN: 03/30/1941 (79 y.o. [name] Primary Care Provider: [name] Other Clinician: Referring Provider: Treating Provider/[name]:Jairen Goldfarb, [name], [name] Weeks in Treatment: 20 Constitutional Sitting or standing Blood Pressure is within target range for patient.. Pulse regular and within target range for patient.Marland Kitchen Respirations regular, non-labored and within target range.. Temperature is normal and within the target range for the  patient.Marland Kitchen Appears in no distress. Notes exam; the right TMA had eschar and dry skin over 2 small areas one laterally and one medially. I used a #3 curet to remove both of these. There is nothing open laterally and a small clean wound medially. Careful inspection of the left foot including the third toe and fourth toe did not show any open areas. Electronic Signature(s) Signed: 12/07/2019 8:43:01 AM By: [name] MD Entered By: [name] on 12/05/2019 11:55:06 -------------------------------------------------------------------------------- Physician Orders Details Patient Name: Date of Service: [name] 12/04/2019 4:00 PM Medical Record Number:[id number] Patient Account Number: [id number] Date of Birth/Sex: Treating RN: June 06, 1941 (79 y.o. 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 - after HBO Dressing Change Frequency Change Dressing every other day. - home health to change twice a week. wound center weekly. Wound Cleansing Clean wound with Wound Cleanser Primary Wound Dressing Wound #7 Right Amputation Site - Transmetatarsal Silver Collagen - moisten with hydrogel. Secondary  Dressing Wound #7 Right Amputation Site - Transmetatarsal Kerlix/Rolled Gauze ABD pad Edema Control Avoid standing for long periods of time Elevate legs to the level of the heart or above for 30 minutes daily and/or when sitting, a frequency of: - throughout the day. Off-Loading DH Walker Boot to: - right foot Open toe surgical shoe to: - felt in surgical shoe for 3rd toe. wound center to provide patient with shoe today. [facility] skilled nursing for wound care. - [facility] twice a week. Electronic Signature(s) Signed: 12/04/2019 5:31:36 PM By: Kela Millin Signed: 12/07/2019 8:43:01 AM By: [name] MD Entered By: Kela Millin on 12/04/2019 17:07:40 -------------------------------------------------------------------------------- Problem List Details Patient Name: Date of Service: [name] 12/04/2019 4:00 PM Medical Record Number:[id number] Patient Account Number: [id number] Date of Birth/Sex: Treating RN: November 04, 1940 (79 y.o. [name] Primary Care Provider: [name] Other Clinician: Referring Provider: Treating Provider/[name]:Allysa Governale, [name], [name] Weeks in Treatment: 20 Active Problems ICD-10 Evaluated Encounter Code Description Active Date Today Diagnosis E11.621 Type 2 diabetes mellitus  with foot ulcer 07/13/2019 No Yes E11.52 Type 2 diabetes mellitus with diabetic peripheral 07/13/2019 No Yes angiopathy with gangrene L97.518 Non-pressure chronic ulcer of other part of right foot 07/13/2019 No Yes with other specified severity L97.528 Non-pressure chronic ulcer of other part of left foot 07/13/2019 No Yes with other specified severity E11.42 Type 2 diabetes mellitus with diabetic polyneuropathy 07/13/2019 No Yes 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, right ankle and foot 10/01/2019 No Yes L97.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 10/16/2019 No Yes limited to  breakdown of skin Inactive Problems Resolved Problems Electronic Signature(s) Signed: 12/07/2019 8:43:01 AM By: [name] MD Previous Signature: 12/04/2019 5:31:36 PM Version By: Kela Millin Entered By: [name] on 12/05/2019 11:49:06 -------------------------------------------------------------------------------- Progress Note Details Patient Name: Date of Service: [name] 12/04/2019 4:00 PM Medical Record Number:[id number] Patient Account Number: [id number] Date of Birth/Sex: Treating RN: 12-May-1941 (79 y.o. [name] Primary Care Provider: [name] Other Clinician: Referring Provider: Treating Provider/[name]:Fynlee Rowlands, [name], [name] Weeks in Treatment: 20 Subjective History of Present Illness (HPI) ADMISSION 07/13/2019 Patient is a 79 year old type II diabetic. She has known PAD. She has been followed by Dr. Jacqualyn Posey of podiatry for blistering areas on her toes dating back to 04/30/2019 which at this was the left fourth toe. By 8/11 she had wounds on the right and left second toes. She underwent arterial studies by Dr. Alvester Chou on 7/31 that showed ABIs on the right of 0.60 TBI of 0.26 on the left ABI of 0.56 and a TBI of 0.25. By 9/10 she had ischemic-looking wounds per Dr. Earleen Newport on the right first, left first second and third. She has been using Medihoney and then mupirocin more recently simply Betadine. The patient underwent angiography by Dr. Gwenlyn Found on 9/21. 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 Noted that there was 1 vessel runoff in the foot via the peroneal. ooOn the left there was an 80% left SFA, 70% mid left SFA. There was a short segment calcified CTO distal less than SFA and above-knee popliteal artery reconstituting with two-vessel runoff. 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 It was felt  that she had bilateral SFA disease as well as tibial vessel disease. An attempt was made to revascularize the left SFA but they were unable to cross because of the highly calcified nature of the lesion. 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 She has an area on the medial left fourth toe with raised horned callus on top of this. I am not certain what this represents. With regards to pain she has about a 2-1/2 I will claudication tolerance in the grocery store. She has some pain in night which is relieved by putting her feet down over the side of the bed. She is wearing Nitro-Dur patches on the top of her right foot. Past medical history includes type 2 diabetes with secondary PAD, neoplasm of the skin, diabetic retinopathy, carotid artery stenosis, hyperlipidemia hypertension. 11/2; the last time the patient was here I spoke to Dr. Gwenlyn Found about revascularization options on the right. As I understand things currently Dr. Gwenlyn Found spoke with Dr. Trula Slade and ultimately the patient was taken to surgery on 10/27. She had a right iliofemoral endarterectomy with a bovine patch angioplasty. I think  the plan now is for her to have a staged intervention on the right SFA by Dr. Alvester Chou. Per the patient's understanding Dr. Gwenlyn Found and Dr. Jacqualyn Posey are waiting to see when the gangrenous toes on the right foot can be amputated. The patient states her pain is a lot better and she is grateful for that. 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 Small area on the left fourth toe. She is using Betadine to the mummified areas on the right and Medihoney on the left 08/27/2019; the last time I saw this patient I discharged her from the clinic. She had been revascularized by Dr. Trula Slade and she had a right iliofemoral endarterectomy with a bovine angioplasty. 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 Jacqualyn Posey of podiatry on 08/07/2019. I note that she also had a intervention by Dr. Gwenlyn Found and he performed a directional atherectomy and drug-coated balloon angioplasty of the SFA and popliteal artery on the right. I am not certain of the date of Dr. Kennon Holter procedure as of the time of this dictation. She was referred back to [place] by Dr. Earleen Newport predominantly for follow-up of the left fourth toe. She still has sutures and stitches in the right TMA site. She states her pain is a lot better. She expresses concern about the condition of the amputation site at the TMA. She is on doxycycline I think prescribed by Dr. Earleen Newport. She is complaining of some pain at night 12/10; I spoke to Dr. Jacqualyn Posey last week. He removed the sutures on the right foot on Monday of this week. She has the area on the left fourth toe just proximal to the PIP and then the right TMA site. She is still on doxycycline and has enough through next week. Unfortunately the TMA site does not look good at all. Both on the lateral and medial part of the incisions are areas that probe to bone. There is purulence over the medial part which I have cultured. We will use silver alginate. Left fourth toe looks somewhat better but there was still exposed bone 12/17; patient has an MRI booked for 12/30. Culture I did last week showed Pseudomonas Serratia and Enterococcus. This was purulent drainage coming out of the medial part of her amputation site. I use cefdinir 300 twice daily for 10 days that started on 12/15. Her x-ray on the right showed limited evaluation for osteomyelitis. The findings could have been postoperative. There was subtle erosion in the distal first and distal fifth metatarsal. An MRI was suggested. On the left she had irregularity of the fourth middle and proximal phalanx consistent with a history of osteomyelitis. I do not know that she has a  history of osteomyelitis in this area. She had a newly defined area on the plantar third toe 12/31; patient's MRI is listed below: MRI OF THE RIGHT FOREFOOT WITHOUT AND WITH CONTRAST TECHNIQUE: Multiplanar, multisequence MR imaging of the right forefoot was performed before and after the administration of intravenous contrast. CONTRAST: 6 mL GADAVIST IV SOLN COMPARISON: Plain films right foot 09/04/2019. FINDINGS: Bones/Joint/Cartilage The patient is status post transmetatarsal amputation as seen on the prior exam. Marrow edema and enhancement are seen in all of the distal metatarsals. In the first metatarsal, signal change extends 1.5 cm proximal to the stump and in the second metatarsal extends approximately 2 cm proximal to the stump. Edema and enhancement are seen in only the distal 0.7 cm  of the third metatarsal stump and tips of the fourth and fifth metatarsals. Bone marrow signal is otherwise unremarkable. A small focus of subchondral edema is seen in the lateral talus, likely degenerative. Ligaments Intact. Muscles and Tendons No intramuscular fluid collection. Soft tissues Skin ulceration is seen off the stump of the first metatarsal. A thin fluid collection tracks deep to the wound and over the anterior metatarsals worrisome for small abscess. Intense subcutaneous edema and enhancement are seen diffusely. 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 Cellulitis about the foot. Skin ulceration over the distal first metatarsal is identified with a thin fluid collection tracking anteriorly along the stump worrisome for abscess. Small focus of subchondral edema in the lateral talus is likely degenerative. Electronically Signed By: Inge Rise M.D. On: 09/23/2019 15:25 Patient arrives in clinic today not complaining any of any  pain. 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 She does not describe claudication however her activity is very limited. 10/01/2019; since the patient was last here I have communicated with Dr. Gwenlyn Found after bypass by Dr. Trula Slade and addressing the right superficial femoral artery he states that she is widely patent through peroneal arteries to the ankle with collaterals to the dorsalis pedis. He states he is going to talk to colleagues about the feasibility of tibial pedal access. The patient seems infectious disease later this afternoon Dr. Baxter Flattery. in preparation for this she has been off antibiotics for 1 week and I went ahead and obtain pieces of the remanence of her first metatarsal for pathology and CandS. The patient is a candidate for hyperbaric oxygen. 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 1/15; considerable improvement in the wounds on her feet. We are using silver collagen. She follows with Dr. Baxter Flattery of infectious disease and is on meropenem and daptomycin. She has been taught how to do this herself at home. Follows with Dr. Graylon Good at the end of treatment here. She has 2 wounds on the surgical TMA site 1 lateral and 1 medial the lateral 1 has regressed tremendously. The area medially still has some exposed bone although the base of this looks healthy. 1/22; 2 separate wounds on the surgical TMA site. Both of these look satisfactory. The medial area does not have any exposed bone. This is an improvement On the left side fourth toe dorsally over the proximal phalanx there is a deep punched out area that probes to bone. She has an area on the tip of the left third toe. She also tells [place] that in HBO she traumatized her left Achilles and this is left her with a superficial wound area 1/28; weekly visit along with HBO. She has 5 wounds. To our punched out areas on  the original TMA site on the right. Both of these appear to have contracted. The area on the right no longer has exposed bone. She has an area on the tip of the left third toe and on the DIP of the left fourth toe. Both of these had surface callus that I removed and unfortunately they have small areas that both go to bone. She has a traumatic wound on the left Achilles area that happened in HBO and that appears better. She is tolerating her IV antibiotics well at home. She has home health changing the dressings  and she is doing it once on the weekends. We have been using silver collagen. She has been extensively revascularized on the right by Dr. Trula Slade and subsequently by Dr. Gwenlyn Found. 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 Baxter Flattery of infectious disease on 2/17. Dr. Gwenlyn Found on 2/23 and according to her her antibiotics stopped on 2/23. She is tolerating hyperbarics well. She has made a tremendous improvement in the right forefoot with only 2 smaller open wounds. Using silver collagen. On the left foot she has the area on the tip of the left third toe and the medial part of the left fourth toe. These had exposed bone last week I did not sense any of that today 2/12; sees Dr. Graylon Good on 2/17 and Dr. Gwenlyn Found on 2/23. We are going to use Dermagraft on this today however the lateral part of her train TMA incision on the right is healed and the medial part is down so much that we just continue with the silver collagen She has wounds on the tip of her left third and the medial aspect of the left fourth. Both of these still have exposed bone I have not been able to get these to epithelialize. 2/19; she sees Dr. Gwenlyn Found on 2/23 and I have communicated with him about the left vascular supply. Looking at her angiogram from 08/03/2019 it looks as though that they could not cross the  left CFA. Noted that she had one-vessel runoff bilaterally. She also apparently saw Dr. Graylon Good although I did not look up these results for preparation of this record. 2/26; Dr. Gwenlyn Found is going to do an angiogram next week on Wednesday. I think they are going to try to go at this both retrograde and anterograde to see if anything can be done to revascularize the left lower limb. She continues to make nice progress on the remaining wound on the right medial foot on her TMA site and the toe it wounds on the left are responding nicely as well 3/12; the patient underwent an extensive and complicated revascularization on the left leg by Dr. Fletcher Anon on 3/3; she had an atherectomy on occluded left popliteal artery; anterior tibial artery followed by a balloon angioplasty. Atherectomy was also performed to the left SFA because there was still significant 50% stenosis in the left popliteal artery they performed an intravascular lithotripsy which improved the residual stenosis to 20%. The same lithotripsy was used to dilate the proximal left SFA. She had a drug-eluting stent placed I believe in the SFA. The patient returned for hyperbarics this week. She has had some eye problems on the right which she tells me are secondary to diabetic retinopathy and she saw her eye doctor. She is really made excellent progress there is no open wounds on the left foot at all. She has one open area medially and her TMA site on the right lateral wound is closed. Objective Constitutional Sitting or standing Blood Pressure is within target range for patient.. Pulse regular and within target range for patient.Marland Kitchen Respirations regular, non-labored and within target range.. Temperature is normal and within the target range for the patient.Marland Kitchen Appears in no distress. Vitals Time Taken: 4:26 PM, Height: 64 in, Weight: 142 lbs, BMI: 24.4, Temperature: 97.5 F, Pulse: 65 bpm, Respiratory Rate: 16 breaths/min, Blood Pressure: 140/69  mmHg. General Notes: exam; the right TMA had  eschar and dry skin over 2 small areas one laterally and one medially. I used a #3 curet to remove both of these. There is nothing open laterally and a small clean wound medially. Careful inspection of the left foot including the third toe and fourth toe did not show any open areas. Integumentary (Hair, Skin) Wound #1 status is Healed - Epithelialized. Original cause of wound was Blister. The wound is located on the Left,Dorsal Toe Fourth. The wound measures 0cm length x 0cm width x 0cm depth; 0cm^2 area and 0cm^3 volume. There is no tunneling or undermining noted. There is a none present amount of drainage noted. The wound margin is distinct with the outline attached to the wound base. There is no granulation within the wound bed. There is no necrotic tissue within the wound bed. Wound #7 status is Open. Original cause of wound was Surgical Injury. 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 The wound measures 0.3cm length x 1cm width x 0.1cm depth; 0.236cm^2 area and 0.024cm^3 volume. There is Fat Layer (Subcutaneous Tissue) Exposed exposed. There is no tunneling or undermining noted. There is a medium amount of serosanguineous drainage noted. The wound margin is well defined and not attached to the wound base. There is medium (34-66%) pink granulation within the wound bed. There is a medium (34-66%) amount of necrotic tissue within the wound bed including Adherent Slough. Wound #8 status is Healed - Epithelialized. Original cause of wound was Gradually Appeared. The wound is located on the Left Toe Third. The wound measures 0cm length x 0cm width x 0cm depth; 0cm^2 area and 0cm^3 volume. There is no tunneling or undermining noted. There is a none present amount of drainage noted. The wound margin is distinct with the outline attached to the wound base. There is no granulation within the wound bed. There is no necrotic tissue  within the wound bed. 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 There was a Selective/Open Wound Skin/Dermis Debridement with a total area of 0.3 sq cm performed by Ricard Dillon., MD. With the following instrument(s): Curette to remove Viable and Non-Viable tissue/material. Material removed includes Eschar, Callus, and Skin: Dermis after achieving pain control using Other (benzocaine, 20%). No specimens were taken. A time out was conducted at 17:00, prior to the start of the procedure. There was no bleeding. The procedure was tolerated well with a pain level of 0 throughout and a pain level of 0 following the procedure. Post Debridement Measurements: 0.3cm length x 1cm width x 0.1cm depth; 0.024cm^3 volume. Character of Wound/Ulcer Post Debridement is improved. Severity of Tissue Post Debridement is: Fat layer exposed. Post procedure Diagnosis Wound #7: Same as Pre-Procedure Plan Follow-up Appointments: Return Appointment in 1 week. - after HBO Dressing Change Frequency: Change Dressing every other day. - home health to change twice a week. wound center weekly. Wound Cleansing: Clean wound with Wound Cleanser  Primary Wound Dressing: Wound #7 Right Amputation Site - Transmetatarsal: Silver Collagen -  moisten with hydrogel. Secondary Dressing: Wound #7 Right Amputation Site - Transmetatarsal: Kerlix/Rolled Gauze ABD pad Edema Control: Avoid standing for long periods of time Elevate legs to the level of the heart or above for 30 minutes daily and/or when sitting, a frequency of: - throughout the day. Off-Loading: DH Walker Boot to: - right foot Open toe surgical shoe to: - felt in surgical shoe for 3rd toe. wound center to provide patient with shoe today. Home Health: [facility] for [facility] care. - [facility] twice a week. #1 the patient has had a remarkable outcome. I didn't see any open area on the toes but these will need to be carefully inspected next week on the left #2 she has 8 more HBO treatments she will complete these but there is no need for any additional treatment #3 as far as we can tell her residual infection has been adequately treated her wounds are closing with only 1 small open area remaining on the medial TMA site. #4 she has undergone extensive revascularization on both sides. She has palpable dorsalis pedis pulses this is also was remarkable outcome #5 this may be totally closed next week. We used silver collagen to the remaining wound on the right foot., Careful inspection of the left toes next week especially the fourth toe. Electronic Signature(s) Signed: 12/07/2019 8:43:01 AM By: [name] MD Entered By: [name] on 12/05/2019 11:57:04 -------------------------------------------------------------------------------- SuperBill Details Patient Name: Date of Service: [name] 12/04/2019 Medical Record Number:[id number] Patient Account Number: [id number] Date of Birth/Sex: Treating RN: April 10, 1941 (79 y.o. [name] Primary Care Provider: [name] Other Clinician: Referring Provider: Treating Provider/[name]:Isaah Furry, [name], [name] Weeks in Treatment: 20 Diagnosis  Coding ICD-10 Codes Code Description E11.621 Type 2 diabetes mellitus with foot ulcer E11.52 Type 2 diabetes mellitus with diabetic peripheral angiopathy with gangrene L97.518 Non-pressure chronic ulcer of other part of right foot with other specified severity L97.528 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ity E11.42 Type 2 diabetes mellitus with diabetic polyneuropathy 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, 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 Facility Procedures The patient participates with Medicare or their insurance follows the Medicare Facility Guidelines: CPT4 Code Description Modifier Quantity [id number] - DEBRIDE WOUND 1ST 20 SQ CM OR < 1 ICD-10 Diagnosis Description L97.518 Non-pressure chronic ulcer  of other part of right foot with other specified severity Physician Procedures CPT4 Code Description: 1007121 [id number] - WC PHYS DEBR WO ANESTH 20 SQ CM ICD-10 Diagnosis Description L97.518 Non-pressure chronic ulcer of other part of right foot with Modifier: other specifie Quantity: 1 d severity Electronic Signature(s) Signed: 12/07/2019 8:43:01 AM By: [name] MD Entered By: [name] on 12/05/2019 11:57:20

## 2019-12-07 NOTE — Progress Notes (Addendum)
[name], [name] ([id number]) Visit Report for 12/07/2019 HBO Details Patient Name: Date of Service: [name], [name] 12/07/2019 2:00 PM Medical Record Number:[id number] Patient Account Number: [id number] Date of Birth/Sex: Treating RN: 16-Sep-1941 (79 y.o. [name] Primary Care Dorlisa Savino: [name] Other Clinician: [name] Referring Lexine Jaspers: Treating Madelaine Whipple/[name]:[name], [name], [name] Weeks in Treatment: 21 HBO Treatment Course Details Treatment Course Number: 1 Ordering Komal Stangelo: [name] Total Treatments Ordered: 40 HBO Treatment Start Date: 10/12/2019 HBO Indication: Diabetic Ulcer(s) of the Lower Extremity HBO Treatment Details Treatment Number: 33 Patient Type: Outpatient Chamber Type: Monoplace Chamber Serial #: [id number] Treatment Protocol: 2.5 ATA with 90 minutes oxygen, with two 5 minute air breaks Treatment Details Compression Rate Down: 2.0 psi / minute De-Compression Rate Up: 2.0 psi / minute Air breaks and CompressTx Pressure breathing periods DecompressDecompress Begins Reached (leave unused spaces Begins Ends blank) Chamber Pressure (ATA)1 2.5 2.5 2.5 2.5 2.5 --2.5 1 Clock Time (24 hr) 14:16 14:28 24:4010:2725:3664:40--34:74 16:20 Treatment Length: 124 (minutes) Treatment Segments: 4 Vital Signs Capillary Blood Glucose Reference Range: 80 - 120 mg / dl HBO Diabetic Blood Glucose Intervention Range: <131 mg/dl or >249 mg/dl Time Vitals Blood Respiratory Capillary Blood Glucose Pulse Action Type: Pulse: Temperature: Taken: Pressure: Rate: Glucose (mg/dl): Meter #: Oximetry (%) Taken: Pre 14:05 132/59 61 17 98.2 205 Post 16:22 151/98 61 15 98.2 148 Treatment Response Treatment Toleration: Well Treatment Completion Treatment Completed without Adverse Event Status: Additional Procedure Documentation Tissue Sevierity: Necrosis of bone [place]en Sudol Notes No concerns with treatment given Physician HBO  Attestation: I certify that I supervised this HBO treatment in accordance with Medicare guidelines. A trained Yes emergency response team is readily available per hospital policies and procedures. Continue HBOT as ordered. Yes Electronic Signature(s) Signed: 12/08/2019 10:32:20 AM By: [name] MD Previous Signature: 12/07/2019 5:11:29 PM Version By: [name] EMT/HBOT Entered By: [name] on 12/07/2019 17:51:36 -------------------------------------------------------------------------------- HBO Safety Checklist Details Patient Name: Date of Service: [name] 12/07/2019 2:00 PM Medical Record Number:[id number] Patient Account Number: [id number] Date of Birth/Sex: Treating RN: Feb 21, 1941 (79 y.o. [name] Primary Care Chandi Nicklin: [name] Other Clinician: [name] Referring Tamikia Chowning: Treating Jakyah Bradby/[name]:[name], [name], [name] Weeks in Treatment: 21 HBO Safety Checklist Items Safety Checklist Consent Form Signed Patient voided / foley secured and emptied When did you last eato 1200 - chicken stir fry Last dose of injectable or oral agent metformin NA Ostomy pouch emptied and vented if applicable All implantable devices assessed, documented and approved Intravenous access site secured and place PICC line left arm Valuables secured Linens and cotton and cotton/polyester blend (less than 51% polyester) Personal oil-based products / skin lotions / body lotions removed Wigs or hairpieces removed NA Smoking or tobacco materials removed Books / newspapers / magazines / loose paper removed Cologne, aftershave, perfume and deodorant removed Jewelry removed (may wrap wedding band) Make-up removed Hair care products removed NA Battery operated devices (external) removed NA Heating patches and chemical warmers removed NA Titanium eyewear removed Nail polish cured greater than 10 hours NA Casting material cured greater  than 10 hours NA Hearing aids removed NA Loose dentures or partials removed NA Prosthetics have been removed Patient demonstrates correct use of air break device (if applicable) Patient concerns have been addressed Patient grounding bracelet on and cord attached to chamber Specifics for Inpatients (complete in addition to above) Medication sheet sent with patient Intravenous medications needed or due during therapy sent with patient Drainage tubes (e.g. nasogastric tube or chest  tube secured and vented) Endotracheal or Tracheotomy tube secured Cuff deflated of air and inflated with saline Airway suctioned Electronic Signature(s) Signed: 12/07/2019 2:30:34 PM By: [name] EMT/HBOT Entered By: [name] on 12/07/2019 14:30:33

## 2019-12-07 NOTE — Progress Notes (Signed)
MARELIN, TAT ([id number]) Visit Report for 12/04/2019 SuperBill Details Patient Name: Date of Service: [name], [name] 12/04/2019 Medical Record Number:[id number] Patient Account Number: [id number] Date of Birth/Sex: Treating RN: Aug 30, 1941 (79 y.o. [name] Primary Care Provider: [name] Other Clinician: [name] Referring Provider: Treating Provider/[name]:Easton Sivertson, [name], [name] Weeks in Treatment: 20 Diagnosis Coding ICD-10 Codes Code Description E11.621 Type 2 diabetes mellitus with foot ulcer E11.52 Type 2 diabetes mellitus with diabetic peripheral angiopathy with gangrene L97.518 Non-pressure chronic ulcer of other part of right foot with other specified severity L97.528 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ity E11.42 Type 2 diabetes mellitus with diabetic polyneuropathy 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, 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 Facility Procedures The patient participates with Medicare or their insurance follows the Medicare Facility Guidelines CPT4 Code Description Modifier Quantity [id number]-(Facility Use Only) HBOT, full body chamber, 73min 4 Physician Procedures CPT4 Code Description Modifier Quantity [id number] 84128 - WC PHYS HYPERBARIC OXYGEN THERAPY 1 ICD-10 Diagnosis Description E11.621 Type 2 diabetes mellitus with foot ulcer Electronic Signature(s) Signed: 12/04/2019 4:47:30 PM By: [name] EMT/HBOT Signed: 12/07/2019 8:43:01 AM By: [name] MD Entered By: [name] on 12/04/2019 16:46:32

## 2019-12-07 NOTE — Progress Notes (Signed)
PATTY, LEITZKE ([id number]) Visit Report for 12/03/2019 SuperBill Details Patient Name: Date of Service: [name], [name] 12/03/2019 Medical Record Number:[id number] Patient Account Number: [id number] Date of Birth/Sex: Treating RN: 1940/10/22 (79 y.o. [name] Primary Care Provider: [name] Other Clinician: Referring Provider: Treating Provider/[name]:Adelina Collard, [name], [name] Weeks in Treatment: 20 Diagnosis Coding ICD-10 Codes Code Description E11.621 Type 2 diabetes mellitus with foot ulcer E11.52 Type 2 diabetes mellitus with diabetic peripheral angiopathy with gangrene L97.518 Non-pressure chronic ulcer of other part of right foot with other specified severity L97.528 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ity E11.42 Type 2 diabetes mellitus with diabetic polyneuropathy 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, 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 Facility Procedures The patient participates with Medicare or their insurance follows the Medicare Facility Guidelines CPT4 Code Description Modifier Quantity [id number] 99212 - WOUND CARE VISIT-LEV 2 EST PT 1 Electronic Signature(s) Signed: 12/05/2019 7:03:15 AM By: [name] MD Signed: 12/07/2019 5:44:18 PM By: [name] RN, BSN Entered By: [name] on 12/03/2019 16:41:12

## 2019-12-08 ENCOUNTER — Telehealth: Payer: Self-pay

## 2019-12-08 ENCOUNTER — Encounter: Payer: Self-pay | Admitting: Cardiovascular Disease

## 2019-12-08 ENCOUNTER — Encounter (HOSPITAL_BASED_OUTPATIENT_CLINIC_OR_DEPARTMENT_OTHER): Payer: Medicare Other | Admitting: Internal Medicine

## 2019-12-08 ENCOUNTER — Ambulatory Visit (INDEPENDENT_AMBULATORY_CARE_PROVIDER_SITE_OTHER): Payer: Medicare Other | Admitting: Cardiovascular Disease

## 2019-12-08 VITALS — BP 142/52 | HR 65 | Ht 64.5 in | Wt 142.4 lb

## 2019-12-08 DIAGNOSIS — E1142 Type 2 diabetes mellitus with diabetic polyneuropathy: Secondary | ICD-10-CM | POA: Diagnosis not present

## 2019-12-08 DIAGNOSIS — L97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 limited to breakdown of skin: Secondary | ICD-10-CM | POA: Diagnosis not present

## 2019-12-08 DIAGNOSIS — Z95820 Peripheral vascular angioplasty status with implants and grafts: Secondary | ICD-10-CM

## 2019-12-08 DIAGNOSIS — I739 Peripheral vascular disease, unspecified: Secondary | ICD-10-CM

## 2019-12-08 DIAGNOSIS — E11621 Type 2 diabetes mellitus with foot ulcer: Secondary | ICD-10-CM | POA: Diagnosis not present

## 2019-12-08 DIAGNOSIS — L97514 Non-pressure chronic ulcer of other part of right foot with necrosis of bone: Secondary | ICD-10-CM | POA: Diagnosis not present

## 2019-12-08 DIAGNOSIS — I6523 Occlusion and stenosis of bilateral carotid arteries: Secondary | ICD-10-CM | POA: Diagnosis not present

## 2019-12-08 DIAGNOSIS — I998 Other disorder of circulatory system: Secondary | ICD-10-CM

## 2019-12-08 DIAGNOSIS — I70229 Atherosclerosis of native arteries of extremities with rest pain, unspecified extremity: Secondary | ICD-10-CM

## 2019-12-08 DIAGNOSIS — M86371 Chronic multifocal osteomyelitis, right ankle and foot: Secondary | ICD-10-CM | POA: Diagnosis not present

## 2019-12-08 DIAGNOSIS — L97528 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ity: Secondary | ICD-10-CM | POA: Diagnosis not present

## 2019-12-08 LAB — GLUCOSE, CAPILLARY
Glucose-Capillary: 161 mg/dL — ABNORMAL HIGH (ref 70–99)
Glucose-Capillary: 130 mg/dL — ABNORMAL HIGH (ref 70–99)

## 2019-12-08 NOTE — Progress Notes (Signed)
12/08/2019 [name]   Mar 18, 1941  [id number]  Primary Physician [name], MD Primary Cardiologist: [name] MD [name], [place]  HPI:  [name] is a 79 y.o.  mildly overweight widowed Caucasian female mother of 48, grandmother 7 grandchildren referred by Dr. Jacqualyn Posey, her podiatrist for critical limb ischemia.I last saw her in the office  11/17/2019. Shedoes have a history of treated hypertension, diabetes and hyperlipidemia. She is never had a heart attack or stroke. She denies chest pain or shortness of breath. 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 She has some local trauma related to socks over Christmas and has developed ischemic appearing wounds on her right second and third toes with recent Dopplers performed 10/09/2018 revealing a right ABI 0.75, left 2.79 with moderately severe right SFA stenosis and an occluded right posterior tibial artery. She is seeing Dr. Jacqualyn Posey back regularly for aggressive local wound care.  Herwounds continue to heal on her right and left second toes. These sores are probably a result of local trauma. Recent ABIs are in the0.6range with a high-grade right SFA stenosis and an occluded left popliteal with tibial vessel disease as well. She does complain of some claudication.She wishes to proceed with endovascular therapy for wound healing and lifestyle limiting claudication.  I performed angiography on her 06/15/2019 with a failed attempt at crossing the left SFA CTO. She has one-vessel runoff bilaterally. She did develop gangrenous right first and second toes. 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 Trula Slade who performed right common femoral endarterectomy with patch angioplasty 07/21/2019 with excellent result. She did see Dr. Jacqualyn Posey back in the  office late last week who thought her wounds were stable. The pain in her feet has improved since her endarterectomy. The plan is to perform orbital atherectomy and PTA of her calcified right SFA as well as intervention on her right tibioperoneal trunk prior to right TMA by Dr. Earleen Newport for limb salvage.  I performed orbital atherectomy followed by drug-coated balloon angioplasty of her right SFA, popliteal and tibioperoneal trunk on 08/03/2019 with excellent result. Her follow-up Dopplers performed 08/14/2019 revealed a widely patent SFA and tibioperoneal trunk. 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 Jacqualyn Posey on 08/07/2019 which almost completely healed with the assistance of hyperbaric oxygen.  She has been seeing Dr. Dellia Nims in the wound care clinic on a weekly basis.  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  She is on 2 IV antibiotics and continues to get hyperbaric oxygen.  Dr. Dellia Nims does not feel these will heal without inline flow.  The patient is a candidate for tibial pedal access of the left dorsalis pedis.   She underwent complex revascularization of her left popliteal artery calcified CTO using antegrade and retrograde/tibial pedal approach by Dr. Fletcher Anon on 11/25/2019.  Her wounds have since almost completely healed.  She is off antibiotics and is about to stop her hyperbaric treatment as well.  She had Doppler studies performed 12/04/2019 that showed a left ABI 0.76 with a patent left popliteal artery and anterior tibial artery.  Current Meds  Medication Sig  . acetaminophen (TYLENOL) 500 MG tablet Take 1,000 mg by mouth every 6 (six) hours as needed for moderate pain or headache.  . Ascorbic Acid (VITAMIN  C) 1000 MG tablet Take 1,000 mg by mouth daily with supper.   Marland Kitchen aspirin EC 81 MG tablet Take 81 mg by mouth every evening.  Marland Kitchen atorvastatin (LIPITOR) 20 MG tablet Take 20 mg by mouth every evening.    . calcium carbonate (TUMS - DOSED IN MG ELEMENTAL CALCIUM) 500 MG chewable tablet Chew 2 tablets by mouth daily as needed for indigestion or heartburn.  Marland Kitchen CINNAMON PO Take 1,000 mg by mouth in the morning, at noon, and at bedtime. Breakfast, Supper, & bedtime  . clopidogrel (PLAVIX) 75 MG tablet Take 1 tablet by mouth once daily (Patient taking differently: Take 75 mg by mouth daily. )  . diltiazem (CARTIA XT) 240 MG 24 hr capsule Take 1 capsule (240 mg total) by mouth daily.  Marland Kitchen glipiZIDE (GLUCOTROL XL) 5 MG 24 hr tablet Take 1 tablet by mouth once daily with breakfast (Patient taking differently: Take 5 mg by mouth daily with breakfast. )  . hydrochlorothiazide (HYDRODIURIL) 25 MG tablet Take 1 tablet by mouth once daily  . INVOKANA 100 MG TABS tablet TAKE 1 TABLET BY MOUTH BEFORE BREAKFAST  . metFORMIN (GLUCOPHAGE) 1000 MG tablet Take 1 tablet (1,000 mg total) by mouth 2 (two) times daily.  . metoprolol (LOPRESSOR) 100 MG tablet Take half tab in the morning and a whole tab in the evening. (Patient taking differently: Take 100 mg by mouth 2 (two) times daily. )  . OZEMPIC, 0.25 OR 0.5 MG/DOSE, 2 MG/1.5ML SOPN Inject 0.5 mg into the skin every Monday.   . pantoprazole (PROTONIX) 40 MG tablet Take 1 tablet (40 mg total) by mouth daily. (Patient taking differently: Take 40 mg by mouth every Monday, Wednesday, and Friday at 8 PM. )  . Probiotic Product ([employer] SENIOR PROBIOTIC PO) Take 1 capsule by mouth daily with breakfast.  . telmisartan (MICARDIS) 80 MG tablet Take 1 tablet (80 mg total) by mouth daily. (Patient taking differently: Take 40 mg by mouth every evening. )  . vitamin B-12 (CYANOCOBALAMIN) 1000 MCG tablet Take 1,000 mcg by mouth daily with supper.   Current Facility-Administered Medications for the 12/08/19 encounter (Office Visit) with [name], MD  Medication  . sodium chloride flush (NS) 0.9 % injection 3 mL  . sodium chloride flush (NS) 0.9 % injection 3 mL     Allergies   Allergen Reactions  . Bactrim [Sulfamethoxazole-Trimethoprim] Other (See Comments)    ^ K+( elevated)   . Demerol [Meperidine]     Delusional   . Scopolamine     Delusional   . Versed [Midazolam] Anxiety    Frantic, out of my mind, agitated     Social History   Socioeconomic History  . Marital status: Widowed    Spouse name: Not on file  . Number of children: Not on file  . Years of education: Not on file  . Highest education level: Not on file  Occupational History  . Not on file  Tobacco Use  . Smoking status: Former Smoker    Quit date: 03/31/1972    Years since quitting: 47.7  . Smokeless tobacco: Never Used  Substance and Sexual Activity  . Alcohol use: No  . Drug use: No  . Sexual activity: Not on file  Other Topics Concern  . Not on file  Social History Narrative  . Not on file   Social Determinants of Health   Financial Resource Strain:   . Difficulty of Paying Living Expenses:   Food Insecurity:   .  Worried About Charity fundraiser in the Last Year:   . Arboriculturist in the Last Year:   Transportation Needs:   . Film/video editor (Medical):   Marland Kitchen Lack of Transportation (Non-Medical):   Physical Activity:   . Days of Exercise per Week:   . Minutes of Exercise per Session:   Stress:   . Feeling of Stress :   Social Connections:   . Frequency of Communication with Friends and Family:   . Frequency of Social Gatherings with Friends and Family:   . Attends Religious Services:   . Active Member of Clubs or Organizations:   . Attends Archivist Meetings:   Marland Kitchen Marital Status:   Intimate Partner Violence:   . Fear of Current or Ex-Partner:   . Emotionally Abused:   Marland Kitchen Physically Abused:   . Sexually Abused:      Review of Systems: General: negative for chills, fever, night sweats or weight changes.  Cardiovascular: negative for chest pain, dyspnea on exertion, edema, orthopnea, palpitations, paroxysmal nocturnal dyspnea or shortness of  breath Dermatological: negative for rash Respiratory: negative for cough or wheezing Urologic: negative for hematuria Abdominal: negative for nausea, vomiting, diarrhea, bright red blood per rectum, melena, or hematemesis Neurologic: negative for visual changes, syncope, or dizziness All other systems reviewed and are otherwise negative except as noted above.    Blood pressure (!) 142/52, pulse 65, height 5' 4.5" (1.638 m), weight 142 lb 6.4 oz (64.6 kg).  General appearance: alert and no distress Neck: no adenopathy, no JVD, supple, symmetrical, trachea midline, thyroid not enlarged, symmetric, no tenderness/mass/nodules and Soft bilateral carotid bruits Lungs: clear to auscultation bilaterally Heart: regular rate and rhythm, S1, S2 normal, no murmur, click, rub or gallop Extremities: extremities normal, atraumatic, no cyanosis or edema Pulses: Palpable left dorsalis pedis Skin: Skin color, texture, turgor normal. No rashes or lesions Neurologic: Alert and oriented X 3, normal strength and tone. Normal symmetric reflexes. Normal coordination and gait  EKG not performed today  ASSESSMENT AND PLAN:   S/P angioplasty with stent Lt SFA of prox segment.  PTCAs with drug coated balloon Lt ant tibial artery and Lt popliteal artery  Ms. Kipnis returns for follow-up after her recent complex antegrade/retrograde recanalization of an occluded popliteal artery on 11/25/2019 by Dr. Fletcher Anon.  Her left third and fourth toe wounds have almost completely healed with the help of hyperbaric oxygen.  Her Dopplers performed on 12/04/2019 showed a left ABI of 0.76 with a patent popliteal artery and anterior tibial artery.  She is almost completely finished her antibiotics as well.  We will get follow-up Dopplers on her in 6 months.      [name] MD FACP,FACC,FAHA, [facility] 12/08/2019 9:29 AM

## 2019-12-08 NOTE — Progress Notes (Signed)
ALEESIA, HENNEY ([id number]) Visit Report for 12/08/2019 Arrival Information Details Patient Name: Date of Service: RESHANDA, LEWEY 12/08/2019 2:00 PM Medical Record Number:[id number] Patient Account Number: [id number] Date of Birth/Sex: Treating RN: 1941-06-05 (79 y.o. F) Primary Care Kassia Demarinis: [name] Other Clinician: [name] Referring Niobe Dick: Treating Aliyyah Riese/[name]:[name], [name], [name] Weeks in Treatment: 21 Visit Information History Since Last Visit Added or deleted any medications: No Patient Arrived: [name] Any new allergies or adverse reactions: No Arrival Time: 13:50 Had a fall or experienced change in No Accompanied By: self activities of daily living that may affect Transfer Assistance: None risk of falls: Patient Identification Verified: Yes Signs or symptoms of abuse/neglect since last No Secondary Verification Process Yes visito Completed: Hospitalized since last visit: No Patient Requires Transmission- No Implantable device outside of the clinic excluding No Based Precautions: cellular tissue based products placed in the center Patient Has Alerts: Yes since last visit: Patient Alerts: Right ABI .62 TBI Pain Present Now: No refused Left ABI .60 TBI .14 Electronic Signature(s) Signed: 12/08/2019 4:45:30 PM By: [name] EMT/HBOT Entered By: [name] on 12/08/2019 14:08:46 -------------------------------------------------------------------------------- Encounter Discharge Information Details Patient Name: Date of Service: [name] 12/08/2019 2:00 PM Medical Record Number:[id number] Patient Account Number: [id number] Date of Birth/Sex: Treating RN: 06-Jun-1941 (79 y.o. F) Primary Care Carl Butner: [name] Other Clinician: [name] Referring Emilianna Barlowe: Treating Isis Costanza/[name]:[name], [name], [name] Weeks in Treatment: 21 Encounter Discharge Information Items Discharge  Condition: Stable Ambulatory Status: Cane Discharge Destination: Home Transportation: Private Auto Accompanied By: self Schedule Follow-up Appointment: Yes Clinical Summary of Care: Patient Declined Electronic Signature(s) Signed: 12/08/2019 4:45:30 PM By: [name] EMT/HBOT Entered By: [name] on 12/08/2019 16:45:02 -------------------------------------------------------------------------------- Patient/Caregiver Education Details Patient Name: Date of Service: [name] 3/16/2021andnbsp2:00 PM Medical Record Patient Account Number: [id number] Number: Treating RN: Date of Birth/Gender: [birth year] (79 y.o. F) Other Clinician: [name] Primary Care Physician: Verna Czech Referring Physician: Physician/[name]: Darl Pikes in Treatment: 21 Education Assessment Education Provided To: Patient Education Topics Provided Hyperbaric Oxygenation: Methods: Explain/Verbal Responses: State content correctly Electronic Signature(s) Signed: 12/08/2019 4:45:30 PM By: [name] EMT/HBOT Entered By: [name] on 12/08/2019 16:44:52 -------------------------------------------------------------------------------- Vitals Details Patient Name: Date of Service: [name] 12/08/2019 2:00 PM Medical Record Number:[id number] Patient Account Number: [id number] Date of Birth/Sex: Treating RN: 04/25/1941 (79 y.o. F) Primary Care Cameran Pettey: [name] Other Clinician: [name] Referring Nohelia Valenza: Treating Jamilette Suchocki/[name]:[name], [name], [name] Weeks in Treatment: 21 Vital Signs Time Taken: 13:55 Temperature (F): 97.6 Height (in): 64 Pulse (bpm): 69 Weight (lbs): 142 Respiratory Rate (breaths/min): 16 Body Mass Index (BMI): 24.4 Blood Pressure (mmHg): 131/40 Capillary Blood Glucose (mg/dl): 161 Reference Range: 80 - 120 mg / dl Electronic Signature(s) Signed:  12/08/2019 4:45:30 PM By: [name] EMT/HBOT Entered By: [name] on 12/08/2019 14:09:05

## 2019-12-08 NOTE — Progress Notes (Signed)
MARLOWE, CINQUEMANI ([id number]) Visit Report for 12/07/2019 SuperBill Details Patient Name: Date of Service: MEMPHIS, CRESWELL 12/07/2019 Medical Record Number:[id number] Patient Account Number: [id number] Date of Birth/Sex: Treating RN: [birth year] (79 y.o. 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, 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 Facility Procedures The patient participates with Medicare or their insurance follows the Medicare Facility Guidelines CPT4 Code Description Modifier Quantity [id number] G0277-(Facility Use Only) HBOT, full body chamber, 57min 4 Physician Procedures CPT4 Code Description Modifier Quantity [id number] 82800 - WC PHYS HYPERBARIC OXYGEN THERAPY 1 ICD-10 Diagnosis Description E11.621 Type 2 diabetes mellitus with foot ulcer Electronic Signature(s) Signed: 12/07/2019 5:11:29 PM By: [name] EMT/HBOT Signed: 12/08/2019 10:32:20 AM By: [name] MD Entered By: [name] on 12/07/2019 16:59:08

## 2019-12-08 NOTE — Assessment & Plan Note (Signed)
[name] returns for follow-up after her recent complex antegrade/retrograde recanalization of an occluded popliteal artery on 11/25/2019 by Dr. Fletcher Anon.  Her left third and fourth toe wounds have almost completely healed with the help of hyperbaric oxygen.  Her Dopplers performed on 12/04/2019 showed a left ABI of 0.76 with a patent popliteal artery and anterior tibial artery.  She is almost completely finished her antibiotics as well.  We will get follow-up Dopplers on her in 6 months.

## 2019-12-08 NOTE — Patient Instructions (Signed)
Medication Instructions:  NO CHANGE *If you need a refill on your cardiac medications before your next appointment, please call your pharmacy*   Lab Work: If you have labs (blood work) drawn today and your tests are completely normal, you will receive your results only by: Marland Kitchen MyChart Message (if you have MyChart) OR . A paper copy in the mail If you have any lab test that is abnormal or we need to change your treatment, we will call you to review the results.   Testing/Procedures: Your physician has requested that you have a lower extremity arterial exercise duplex. During this test, exercise and ultrasound are used to evaluate arterial blood flow in the legs. Allow one hour for this exam. There are no restrictions or special instructions. SCHEDULE IN 6 MONTHS   Follow-Up: At [facility], you and your health needs are our priority.  As part of our continuing mission to provide you with exceptional heart care, we have created designated Provider Care Teams.  These Care Teams include your primary Cardiologist (physician) and Advanced Practice Providers (APPs -  Physician Assistants and Nurse Practitioners) who all work together to provide you with the care you need, when you need it.  We recommend signing up for the patient portal called "MyChart".  Sign up information is provided on this After Visit Summary.  MyChart is used to connect with patients for Virtual Visits (Telemedicine).  Patients are able to view lab/test results, encounter notes, upcoming appointments, etc.  Non-urgent messages can be sent to your provider as well.   To learn more about what you can do with MyChart, go to [email or web address].    Your next appointment:   6 month(s)  The format for your next appointment:   In Person  Provider:   [name] MD [place]

## 2019-12-08 NOTE — Progress Notes (Addendum)
AIDEL, DAVISSON ([id number]) Visit Report for 12/08/2019 HBO Details Patient Name: Date of Service: [name], [name] 12/08/2019 2:00 PM Medical Record Number:[id number] Patient Account Number: [id number] Date of Birth/Sex: Treating RN: [birth year] (79 y.o. F) Primary Care Zanya Lindo: [name] Other Clinician: [name] Referring Tonita Bills: Treating Lacreshia Bondarenko/[name]:[name], [name], [name] Weeks in Treatment: 21 HBO Treatment Course Details Treatment Course Number: 1 Ordering Damione Robideau: [name] Total Treatments Ordered: 40 HBO Treatment Start Date: 10/12/2019 HBO Indication: Diabetic Ulcer(s) of the Lower Extremity HBO Treatment Details Treatment Number: 34 Patient Type: Outpatient Chamber Type: Monoplace Chamber Serial #: [id number] Treatment Protocol: 2.5 ATA with 90 minutes oxygen, with two 5 minute air breaks Treatment Details Compression Rate Down: 2.0 psi / minute De-Compression Rate Up: 2.0 psi / minute Air breaks and CompressTx Pressure breathing periods DecompressDecompress Begins Reached (leave unused spaces Begins Ends blank) Chamber Pressure (ATA)1 2.5 2.5 2.5 2.5 2.5 --2.5 1 Clock Time (24 hr) 14:05 14:17 52:7782:4235:3614:43--15:40 16:09 Treatment Length: 124 (minutes) Treatment Segments: 4 Vital Signs Capillary Blood Glucose Reference Range: 80 - 120 mg / dl HBO Diabetic Blood Glucose Intervention Range: <131 mg/dl or >249 mg/dl Time Vitals Blood Respiratory Capillary Blood Glucose Pulse Action Type: Pulse: Temperature: Taken: Pressure: Rate: Glucose (mg/dl): Meter #: Oximetry (%) Taken: Pre 13:55 131/40 69 16 97.6 161 Post 16:11 121/38 67 14 97.6 130 Treatment Response Treatment Toleration: Well Treatment Completion Treatment Completed without Adverse Event Status: Additional Procedure Documentation Tissue Sevierity: Necrosis of bone Basilio Meadow Notes no concerns with treatment given Physician HBO Attestation: I certify that  I supervised this HBO treatment in accordance with Medicare guidelines. A trained Yes emergency response team is readily available per hospital policies and procedures. Continue HBOT as ordered. Yes Electronic Signature(s) Signed: 12/08/2019 5:29:05 PM By: [name] MD Previous Signature: 12/08/2019 4:45:30 PM Version By: [name] EMT/HBOT Entered By: [name] on 12/08/2019 17:28:14 -------------------------------------------------------------------------------- HBO Safety Checklist Details Patient Name: Date of Service: [name] 12/08/2019 2:00 PM Medical Record Number:[id number] Patient Account Number: [id number] Date of Birth/Sex: Treating RN: 1941/07/28 (79 y.o. F) Primary Care Niklaus Mamaril: [name] Other Clinician: [name] Referring Neri Vieyra: Treating Mumtaz Lovins/[name]:[name], [name], [name] Weeks in Treatment: 21 HBO Safety Checklist Items Safety Checklist Consent Form Signed Patient voided / foley secured and emptied 1200 - PBandJ sammy / orange When did you last eato juice Last dose of injectable or oral agent metformin Ostomy pouch emptied and vented if applicable NA All implantable devices assessed, documented and approved Intravenous access site secured and place PICC line left arm Valuables secured Linens and cotton and cotton/polyester blend (less than 51% polyester) Personal oil-based products / skin lotions / body lotions removed Wigs or hairpieces removed Smoking or tobacco materials removed NA Books / newspapers / magazines / loose paper removed Cologne, aftershave, perfume and deodorant removed Jewelry removed (may wrap wedding band) Make-up removed NA Hair care products removed Battery operated devices (external) removed NA Heating patches and chemical warmers removed NA Titanium eyewear removed NA Nail polish cured greater than 10 hours NA Casting material cured greater than 10  hours NA Hearing aids removed NA Loose dentures or partials removed NA Prosthetics have been removed NA Patient demonstrates correct use of air break device (if applicable) Patient concerns have been addressed Patient grounding bracelet on and cord attached to chamber Specifics for Inpatients (complete in addition to above) Medication sheet sent with patient Intravenous medications needed or due during therapy sent with patient Drainage tubes (e.g. nasogastric tube or chest  tube secured and vented) Endotracheal or Tracheotomy tube secured Cuff deflated of air and inflated with saline Airway suctioned Electronic Signature(s) Signed: 12/08/2019 2:10:27 PM By: [name] EMT/HBOT Entered By: [name] on 12/08/2019 14:10:26

## 2019-12-08 NOTE — Telephone Encounter (Signed)
Called patient left recent doppler results on personal voice mail.Advised to repeat in 6 months.

## 2019-12-08 NOTE — Progress Notes (Signed)
VIDA, NICOL ([id number]) Visit Report for 12/08/2019 SuperBill Details Patient Name: Date of Service: [name], [name] 12/08/2019 Medical Record Number:[id number] Patient Account Number: [id number] Date of Birth/Sex: Treating RN: 05/29/1941 (79 y.o. 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, 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 Facility Procedures The patient participates with Medicare or their insurance follows the Medicare Facility Guidelines CPT4 Code Description Modifier Quantity [id number] G0277-(Facility Use Only) HBOT, full body chamber, 18min 4 Physician Procedures CPT4 Code Description Modifier Quantity 4034742 59563 - WC PHYS HYPERBARIC OXYGEN THERAPY 1 ICD-10 Diagnosis Description E11.621 Type 2 diabetes mellitus with foot ulcer Electronic Signature(s) Signed: 12/08/2019 4:45:30 PM By: [name] EMT/HBOT Signed: 12/08/2019 5:29:05 PM By: [name] MD Entered By: [name] on 12/08/2019 16:44:37

## 2019-12-09 ENCOUNTER — Encounter (HOSPITAL_BASED_OUTPATIENT_CLINIC_OR_DEPARTMENT_OTHER): Payer: Medicare Other | Admitting: Physician Assistant

## 2019-12-09 ENCOUNTER — Other Ambulatory Visit: Payer: Self-pay

## 2019-12-09 ENCOUNTER — Encounter: Payer: Self-pay | Admitting: Internal Medicine

## 2019-12-09 ENCOUNTER — Ambulatory Visit (INDEPENDENT_AMBULATORY_CARE_PROVIDER_SITE_OTHER): Payer: Medicare Other | Admitting: Internal Medicine

## 2019-12-09 VITALS — BP 155/69 | HR 69

## 2019-12-09 DIAGNOSIS — E1142 Type 2 diabetes mellitus with diabetic polyneuropathy: Secondary | ICD-10-CM | POA: Diagnosis not present

## 2019-12-09 DIAGNOSIS — L97514 Non-pressure chronic ulcer of other part of right foot with necrosis of bone: Secondary | ICD-10-CM | POA: Diagnosis not present

## 2019-12-09 DIAGNOSIS — M86679 Other chronic osteomyelitis, unspecified ankle and foot: Secondary | ICD-10-CM

## 2019-12-09 DIAGNOSIS — L97528 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ity: Secondary | ICD-10-CM | POA: Diagnosis not present

## 2019-12-09 DIAGNOSIS — E11621 Type 2 diabetes mellitus with foot ulcer: Secondary | ICD-10-CM | POA: Diagnosis not present

## 2019-12-09 DIAGNOSIS — L97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 limited to breakdown of skin: Secondary | ICD-10-CM | POA: Diagnosis not present

## 2019-12-09 DIAGNOSIS — I6523 Occlusion and stenosis of bilateral carotid arteries: Secondary | ICD-10-CM | POA: Diagnosis not present

## 2019-12-09 DIAGNOSIS — M86371 Chronic multifocal osteomyelitis, right ankle and foot: Secondary | ICD-10-CM | POA: Diagnosis not present

## 2019-12-09 LAB — GLUCOSE, CAPILLARY
Glucose-Capillary: 155 mg/dL — ABNORMAL HIGH (ref 70–99)
Glucose-Capillary: 124 mg/dL — ABNORMAL HIGH (ref 70–99)

## 2019-12-09 NOTE — Progress Notes (Signed)
[name], [name] ([id number]) Visit Report for 12/09/2019 SuperBill Details Patient Name: Date of Service: [name], [name] 12/09/2019 Medical Record Number:[id number] Patient Account Number: [id number] Date of Birth/Sex: Treating RN: 10-29-1940 (79 y.o. 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, 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 Facility Procedures The patient participates with Medicare or their insurance follows the Medicare Facility Guidelines CPT4 Code Description Modifier Quantity [id number] G0277-(Facility Use Only) HBOT, full body chamber, 45min 4 Physician Procedures CPT4 Code Description Modifier Quantity 4132440 10272 - WC PHYS HYPERBARIC OXYGEN THERAPY 1 ICD-10 Diagnosis Description E11.621 Type 2 diabetes mellitus with foot ulcer Electronic Signature(s) Signed: 12/09/2019 4:08:06 PM By: [name] EMT/HBOT Signed: 12/09/2019 4:26:16 PM By: Worthy Keeler PA-C Entered By: [name] on 12/09/2019 [place]

## 2019-12-09 NOTE — Progress Notes (Signed)
AMAMDA, CURBOW ([id number]) Visit Report for 12/09/2019 Arrival Information Details Patient Name: Date of Service: JENAVIE, STANCZAK 12/09/2019 2:00 PM Medical Record Number:[id number] Patient Account Number: [id number] Date of Birth/Sex: Treating RN: 23-Apr-1941 (79 y.o. [name] Primary Care Riyaan Heroux: [name] Other Clinician: [name] Referring Vincenza Dail: Treating Nikash Mortensen/[name]:Stone III, Alen Bleacher, [name] Weeks in Treatment: 21 Visit Information History Since Last Visit Added or deleted any medications: No Patient Arrived: [name] Any new allergies or adverse reactions: No Arrival Time: 12:50 Had a fall or experienced change in No Accompanied By: self activities of daily living that may affect Transfer Assistance: None risk of falls: Patient Identification Verified: Yes Signs or symptoms of abuse/neglect since last No Secondary Verification Process Yes visito Completed: Hospitalized since last visit: No Patient Requires Transmission- No Implantable device outside of the clinic excluding No Based Precautions: cellular tissue based products placed in the center Patient Has Alerts: Yes since last visit: Patient Alerts: Right ABI .62 TBI Pain Present Now: No refused Left ABI .60 TBI .14 Electronic Signature(s) Signed: 12/09/2019 4:08:06 PM By: [name] EMT/HBOT Entered By: [name] on 12/09/2019 14:38:10 -------------------------------------------------------------------------------- Encounter Discharge Information Details Patient Name: Date of Service: [name] 12/09/2019 2:00 PM Medical Record Number:[id number] Patient Account Number: [id number] Date of Birth/Sex: Treating RN: [birth year] (79 y.o. [name] Primary Care Romney Compean: [name] Other Clinician: [name] Referring Jakayla Schweppe: Treating Dylon Correa/[name]:Stone III, Alen Bleacher, [name] Weeks in Treatment: 34 Encounter Discharge  Information Items Discharge Condition: Stable Ambulatory Status: Cane Discharge Destination: Home Transportation: Private Auto Accompanied By: self Schedule Follow-up Appointment: Yes Clinical Summary of Care: Patient Declined Electronic Signature(s) Signed: 12/09/2019 4:08:06 PM By: [name] EMT/HBOT Entered By: [name] on 12/09/2019 16:07:40 -------------------------------------------------------------------------------- Patient/Caregiver Education Details Patient Name: Date of Service: Quigley, Charmain H. 3/17/2021andnbsp2:00 PM Medical Record Patient Account Number: [id number] Number: Treating RN: Baruch Gouty Date of Birth/Gender: 1941/02/04 (79 y.o. F) Other Clinician: [name] Primary Care Physician: [name] Treating Worthy Keeler Referring Physician: Physician/[name]: Darl Pikes in Treatment: 21 Education Assessment Education Provided To: Patient Education Topics Provided Hyperbaric Oxygenation: Methods: Explain/Verbal Responses: State content correctly Electronic Signature(s) Signed: 12/09/2019 4:08:06 PM By: [name] EMT/HBOT Entered By: [name] on 12/09/2019 16:07:25 -------------------------------------------------------------------------------- Vitals Details Patient Name: Date of Service: [name] 12/09/2019 2:00 PM Medical Record Number:[id number] Patient Account Number: [id number] Date of Birth/Sex: Treating RN: August 09, 1941 (79 y.o. [name], [name] Primary Care Chanette Demo: [name] Other Clinician: [name] Referring Kamariyah Timberlake: Treating Angelos Wasco/[name]:Stone III, Alen Bleacher, [name] Weeks in Treatment: 21 Vital Signs Time Taken: 12:55 Temperature (F): 97.8 Height (in): 64 Pulse (bpm): 74 Weight (lbs): 142 Respiratory Rate (breaths/min): 18 Body Mass Index (BMI): 24.4 Blood Pressure (mmHg): 165/46 Capillary Blood Glucose (mg/dl): 155 Reference  Range: 80 - 120 mg / dl Electronic Signature(s) Signed: 12/09/2019 4:08:06 PM By: [name] EMT/HBOT Entered By: [name] on 12/09/2019 14:38:27

## 2019-12-09 NOTE — Progress Notes (Signed)
Per verbal order from Dr Baxter Flattery, 36 cm Single Lumen Peripherally Inserted Central Catheter removed from left basilic, tip intact. No sutures present. RN confirmed length per chart. Dressing was clean and dry. Petroleum dressing applied. Pt advised no heavy lifting with this arm, leave dressing for 24 hours and call the office or seek emergent care if dressing becomes soaked with blood or swelling or sharp pain presents. Patient verbalized understanding and agreement.  Patient's questions answered to their satisfaction. Patient tolerated procedure well, RN walked patient to check out.  Notified [facility] and Cassie.  Landis Gandy, RN

## 2019-12-09 NOTE — Progress Notes (Addendum)
[name], [name] ([id number]) Visit Report for 12/09/2019 HBO Details Patient Name: Date of Service: [name], [name] 12/09/2019 2:00 PM Medical Record Number:[id number] Patient Account Number: [id number] Date of Birth/Sex: Treating RN: Mar 04, 1941 (79 y.o. [name], [name] Primary Care Kushi Kun: [name] Other Clinician: [name] Referring Gwendolen Hewlett: Treating Laquitha Heslin/[name]:Stone III, Alen Bleacher, [name] Weeks in Treatment: 21 HBO Treatment Course Details Treatment Course Number: 1 Ordering Donaven Criswell: [name] Total Treatments Ordered: 40 HBO Treatment Start Date: 10/12/2019 HBO Indication: Diabetic Ulcer(s) of the Lower Extremity HBO Treatment Details Treatment Number: 35 Patient Type: Outpatient Chamber Type: Monoplace Chamber Serial #: [id number] Treatment Protocol: 2.5 ATA with 90 minutes oxygen, with two 5 minute air breaks Treatment Details Compression Rate Down: 2.0 psi / minute De-Compression Rate Up: 2.0 psi / minute Air breaks and CompressTx Pressure breathing periods DecompressDecompress Begins Reached (leave unused spaces Begins Ends blank) Chamber Pressure (ATA)1 2.5 2.5 2.5 2.5 2.5 --2.5 1 Clock Time (24 hr) 13:11 13:23 49:4496:7591:6384:66--59:93 15:15 Treatment Length: 124 (minutes) Treatment Segments: 4 Vital Signs Capillary Blood Glucose Reference Range: 80 - 120 mg / dl HBO Diabetic Blood Glucose Intervention Range: <131 mg/dl or >249 mg/dl Time Vitals Blood Respiratory Capillary Blood Glucose Pulse Action Type: Pulse: Temperature: Taken: Pressure: Rate: Glucose (mg/dl): Meter #: Oximetry (%) Taken: Pre 12:55 165/46 74 18 97.8 155 Post 15:17 139/53 69 14 98.5 124 Treatment Response Treatment Toleration: Well Treatment Completion Treatment Completed without Adverse Event Status: Additional Procedure Documentation Tissue Sevierity: Necrosis of bone Electronic Signature(s) Signed: 12/09/2019 4:08:06 PM By: [name]  EMT/HBOT Signed: 12/09/2019 4:26:16 PM By: Worthy Keeler PA-C Entered By: [name] on 12/09/2019 16:06:05 -------------------------------------------------------------------------------- HBO Safety Checklist Details Patient Name: Date of Service: [name] 12/09/2019 2:00 PM Medical Record Number:[id number] Patient Account Number: [id number] Date of Birth/Sex: Treating RN: 29-Oct-1940 (79 y.o. [name], [name] Primary Care Alyana Kreiter: [name] Other Clinician: [name] Referring Gianpaolo Mindel: Treating Koda Defrank/[name]:Stone III, Alen Bleacher, [name] Weeks in Treatment: 21 HBO Safety Checklist Items Safety Checklist Consent Form Signed Patient voided / foley secured and emptied When did you last eato 1200 - chicken sammy Last dose of injectable or oral agent metformin NA Ostomy pouch emptied and vented if applicable NA All implantable devices assessed, documented and approved NA Intravenous access site secured and place Valuables secured Linens and cotton and cotton/polyester blend (less than 51% polyester) Personal oil-based products / skin lotions / body lotions removed Wigs or hairpieces removed NA Smoking or tobacco materials removed Books / newspapers / magazines / loose paper removed Cologne, aftershave, perfume and deodorant removed Jewelry removed (may wrap wedding band) Make-up removed Hair care products removed NA Battery operated devices (external) removed NA Heating patches and chemical warmers removed NA Titanium eyewear removed Nail polish cured greater than 10 hours NA Casting material cured greater than 10 hours NA Hearing aids removed NA Loose dentures or partials removed NA Prosthetics have been removed Patient demonstrates correct use of air break device (if applicable) Patient concerns have been addressed Patient grounding bracelet on and cord attached to chamber Specifics for Inpatients (complete in addition to  above) Medication sheet sent with patient Intravenous medications needed or due during therapy sent with patient Drainage tubes (e.g. nasogastric tube or chest tube secured and vented) Endotracheal or Tracheotomy tube secured Cuff deflated of air and inflated with saline Airway suctioned Electronic Signature(s) Signed: 12/09/2019 2:41:43 PM By: [name] EMT/HBOT Entered By: [name] on 12/09/2019 14:41:43

## 2019-12-09 NOTE — Progress Notes (Signed)
Patient ID: [name], female   DOB: [birth year], 79 y.o.   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 Followed by wound care and dr Dellia Nims. Undergoing HBO therapy- has been doing 5th week of 8 scheduled sessions. She states that she has had the most improvement in healing in the last 4 weeks since starting   Has been on daptomycin and meropenem . It was extended through mid march due to open wounds for a total of 8 wks -finished last thursday   cx showing serratia and PsA (R ceftaz, S FQ  And S Carbapenem), and enterococcus  Sees dr Dellia Nims on Friday, but still has daily HBO x 6 more sessions  Since we last saw her. She has had revascularization with lithotripsy in her left leg   Outpatient Encounter Medications as of 12/09/2019  Medication Sig  . acetaminophen (TYLENOL) 500 MG tablet Take 1,000 mg by mouth every 6 (six) hours as needed for moderate pain or headache.  . Ascorbic Acid (VITAMIN C) 1000 MG tablet Take 1,000 mg by mouth daily with supper.   Marland Kitchen aspirin EC 81 MG tablet Take 81 mg by mouth every evening.  Marland Kitchen atorvastatin (LIPITOR) 20 MG tablet Take 20 mg by mouth every evening.  . calcium carbonate (TUMS - DOSED IN MG ELEMENTAL CALCIUM) 500 MG chewable tablet Chew 2 tablets by mouth daily as needed for indigestion or heartburn.  Marland Kitchen CINNAMON PO Take 1,000 mg by mouth in the morning, at noon, and at bedtime. Breakfast, Supper, & bedtime  . clopidogrel (PLAVIX) 75 MG tablet Take 1 tablet by mouth once daily (Patient taking differently: Take 75 mg by mouth daily. )  . diltiazem (CARTIA XT) 240 MG 24 hr capsule Take 1 capsule (240 mg total) by mouth daily.  Marland Kitchen glipiZIDE (GLUCOTROL XL) 5 MG 24 hr tablet Take 1 tablet by mouth once daily with breakfast (Patient taking differently: Take 5 mg by mouth daily with breakfast. )  .  hydrochlorothiazide (HYDRODIURIL) 25 MG tablet Take 1 tablet by mouth once daily  . INVOKANA 100 MG TABS tablet TAKE 1 TABLET BY MOUTH BEFORE BREAKFAST  . metFORMIN (GLUCOPHAGE) 1000 MG tablet Take 1 tablet (1,000 mg total) by mouth 2 (two) times daily.  . metoprolol (LOPRESSOR) 100 MG tablet Take half tab in the morning and a whole tab in the evening. (Patient taking differently: Take 100 mg by mouth 2 (two) times daily. )  . OZEMPIC, 0.25 OR 0.5 MG/DOSE, 2 MG/1.5ML SOPN Inject 0.5 mg into the skin every Monday.   . pantoprazole (PROTONIX) 40 MG tablet Take 1 tablet (40 mg total) by mouth daily. (Patient taking differently: Take 40 mg by mouth every Monday, Wednesday, and Friday at 8 PM. )  . Probiotic Product ([employer] SENIOR PROBIOTIC PO) Take 1 capsule by mouth daily with breakfast.  . telmisartan (MICARDIS) 80 MG tablet Take 1 tablet (80 mg total) by mouth daily. (Patient taking differently: Take 40 mg by mouth every evening. )  . vitamin B-12 (CYANOCOBALAMIN) 1000 MCG tablet Take 1,000 mcg by mouth daily with supper.   Facility-Administered Encounter Medications as of 12/09/2019  Medication  . sodium chloride flush (NS) 0.9 % injection 3 mL  . sodium chloride flush (NS) 0.9 % injection 3 mL     Patient Active Problem List   Diagnosis Date Noted  .  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  PTCAs with drug coated balloon Lt ant tibial artery and Lt popliteal artery  11/26/2019  . Gangrene of foot ([place]) 08/07/2019  . Normocytic anemia 08/07/2019  . 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 Critical lower limb ischemia 08/03/2019  . Encounter for immunization 07/09/2016  . Essential hypertension 03/31/2013  . Dyslipidemia 03/31/2013  . DM2 (diabetes mellitus, type 2) ([place]) 03/31/2013  . Overweight (BMI 25.0-29.9) 03/31/2013  . Carotid artery disease ([place]) 03/31/2013  . PAD (peripheral artery disease) ([place]) 03/31/2013     Health Maintenance Due  Topic Date Due   . OPHTHALMOLOGY EXAM  Never done  . TETANUS/TDAP  Never done  . DEXA SCAN  Never done  . PNA vac Low Risk Adult (1 of 2 - PCV13) Never done  . FOOT EXAM  05/02/2019    Social History   Tobacco Use  . Smoking status: Former Smoker    Quit date: 03/31/1972    Years since quitting: 47.7  . Smokeless tobacco: Never Used  Substance Use Topics  . Alcohol use: No  . Drug use: No   Review of Systems + hx of wound, but overall 12 point ros is otherwise negative Physical Exam   BP (!) 155/69 Comment: took BP meds 45 minutes ago  Pulse 69   -small ischemia left foot, 4th toe eschar Right TM foot looks well healed. Just small subcm eschar no surrounding erythema CBC Lab Results  Component Value Date   WBC 9.8 11/17/2019   RBC 4.03 11/17/2019   HGB 11.4 11/17/2019   HCT 34.7 11/17/2019   PLT 344 11/17/2019   MCV 86 11/17/2019   MCH 28.3 11/17/2019   MCHC 32.9 11/17/2019   RDW 14.2 11/17/2019   LYMPHSABS 2.2 11/01/2019   MONOABS 0.9 11/01/2019   EOSABS 1.0 (H) 11/01/2019    BMET Lab Results  Component Value Date   NA 142 11/17/2019   K 5.4 (H) 11/17/2019   CL 101 11/17/2019   CO2 27 11/17/2019   GLUCOSE 157 (H) 11/17/2019   BUN 29 (H) 11/17/2019   CREATININE 0.79 11/17/2019   CALCIUM 10.5 (H) 11/17/2019   GFRNONAA 72 11/17/2019   GFRAA 84 11/17/2019   Lab Results  Component Value Date   ESRSEDRATE 11 12/09/2019   Lab Results  Component Value Date   CRP 3.3 12/09/2019       Assessment and Plan Osteomyelitis =  Will check labs Sed rate crp Pull piccline at this visit Decide if need orals with amox plus cipro. - markers appear improved thus we will not place on oral abtx at this time for  May need repeat mri on follow up visit  Spent 20 min in direct face to face care

## 2019-12-10 ENCOUNTER — Encounter (HOSPITAL_BASED_OUTPATIENT_CLINIC_OR_DEPARTMENT_OTHER): Payer: Medicare Other | Admitting: Internal Medicine

## 2019-12-10 DIAGNOSIS — M86371 Chronic multifocal osteomyelitis, right ankle and foot: Secondary | ICD-10-CM | POA: Diagnosis not present

## 2019-12-10 DIAGNOSIS — L97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 limited to breakdown of skin: Secondary | ICD-10-CM | POA: Diagnosis not present

## 2019-12-10 DIAGNOSIS — I10 Essential (primary) hypertension: Secondary | ICD-10-CM | POA: Diagnosis not present

## 2019-12-10 DIAGNOSIS — E11621 Type 2 diabetes mellitus with foot ulcer: Secondary | ICD-10-CM | POA: Diagnosis not present

## 2019-12-10 DIAGNOSIS — L97514 Non-pressure chronic ulcer of other part of right foot with necrosis of bone: Secondary | ICD-10-CM | POA: Diagnosis not present

## 2019-12-10 DIAGNOSIS — E1152 Type 2 diabetes mellitus with diabetic peripheral angiopathy with gangrene: Secondary | ICD-10-CM | POA: Diagnosis not present

## 2019-12-10 DIAGNOSIS — Z4781 Encounter for orthopedic aftercare following surgical amputation: Secondary | ICD-10-CM | POA: Diagnosis not present

## 2019-12-10 DIAGNOSIS — L97528 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ity: Secondary | ICD-10-CM | POA: Diagnosis not present

## 2019-12-10 DIAGNOSIS — D649 Anemia, unspecified: Secondary | ICD-10-CM | POA: Diagnosis not present

## 2019-12-10 DIAGNOSIS — E1142 Type 2 diabetes mellitus with diabetic polyneuropathy: Secondary | ICD-10-CM | POA: Diagnosis not present

## 2019-12-10 LAB — CBC WITH DIFFERENTIAL/PLATELET
Absolute Monocytes: 772 cells/uL (ref 200–950)
Basophils Absolute: 89 cells/uL (ref 0–200)
Basophils Relative: 0.9 %
Eosinophils Absolute: 218 cells/uL (ref 15–500)
Eosinophils Relative: 2.2 %
HCT: 34 % — ABNORMAL LOW (ref 35.0–45.0)
Hemoglobin: 11.3 g/dL — ABNORMAL LOW (ref 11.7–15.5)
Lymphs Abs: 2604 cells/uL (ref 850–3900)
MCH: 27.5 pg (ref 27.0–33.0)
MCHC: 33.2 g/dL (ref 32.0–36.0)
MCV: 82.7 fL (ref 80.0–100.0)
MPV: 12 fL (ref 7.5–12.5)
Monocytes Relative: 7.8 %
Neutro Abs: 6217 cells/uL (ref 1500–7800)
Neutrophils Relative %: 62.8 %
Platelets: 427 10*3/uL — ABNORMAL HIGH (ref 140–400)
RBC: 4.11 10*6/uL (ref 3.80–5.10)
RDW: 13.9 % (ref 11.0–15.0)
Total Lymphocyte: 26.3 %
WBC: 9.9 10*3/uL (ref 3.8–10.8)

## 2019-12-10 LAB — BASIC METABOLIC PANEL
BUN/Creatinine Ratio: 43 (calc) — ABNORMAL HIGH (ref 6–22)
BUN: 37 mg/dL — ABNORMAL HIGH (ref 7–25)
CO2: 29 mmol/L (ref 20–32)
Calcium: 10.8 mg/dL — ABNORMAL HIGH (ref 8.6–10.4)
Chloride: 102 mmol/L (ref 98–110)
Creat: 0.86 mg/dL (ref 0.60–0.93)
Glucose, Bld: 168 mg/dL — ABNORMAL HIGH (ref 65–99)
Potassium: 4.6 mmol/L (ref 3.5–5.3)
Sodium: 139 mmol/L (ref 135–146)

## 2019-12-10 LAB — GLUCOSE, CAPILLARY
Glucose-Capillary: 189 mg/dL — ABNORMAL HIGH (ref 70–99)
Glucose-Capillary: 143 mg/dL — ABNORMAL HIGH (ref 70–99)

## 2019-12-10 LAB — C-REACTIVE PROTEIN: CRP: 3.3 mg/L (ref <8.0)

## 2019-12-10 LAB — SEDIMENTATION RATE: Sed Rate: 11 mm/h (ref 0–30)

## 2019-12-10 NOTE — Progress Notes (Signed)
ARIYONNA, TWICHELL ([id number]) Visit Report for 12/10/2019 SuperBill Details Patient Name: Date of Service: [name], [name] 12/10/2019 Medical Record Number:[id number] Patient Account Number: [id number] Date of Birth/Sex: Treating RN: 07-20-1941 (79 y.o. 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, 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 Facility Procedures The patient participates with Medicare or their insurance follows the Medicare Facility Guidelines CPT4 Code Description Modifier Quantity [id number] G0277-(Facility Use Only) HBOT, full body chamber, 49min 4 Physician Procedures CPT4 Code Description Modifier Quantity 4782956 21308 - WC PHYS HYPERBARIC OXYGEN THERAPY 1 ICD-10 Diagnosis Description E11.621 Type 2 diabetes mellitus with foot ulcer Electronic Signature(s) Signed: 12/10/2019 4:26:43 PM By: [name] EMT/HBOT Signed: 12/10/2019 7:02:26 PM By: [name] MD Entered By: [name] on 12/10/2019 16:24:33

## 2019-12-10 NOTE — Progress Notes (Signed)
SIMRANJIT, THAYER ([id number]) Visit Report for 12/10/2019 Arrival Information Details Patient Name: Date of Service: FLORESTINE, CARMICAL 12/10/2019 2:00 PM Medical Record Number:[id number] Patient Account Number: [id number] Date of Birth/Sex: Treating RN: 1940-11-23 (79 y.o. [name] Primary Care Munachimso Rigdon: [name] Other Clinician: [name] Referring Arly Salminen: Treating Vedha Tercero/[name]:[name], [name], [name] Weeks in Treatment: 21 Visit Information History Since Last Visit Added or deleted any medications: No Patient Arrived: [name] Any new allergies or adverse reactions: No Arrival Time: 13:50 Had a fall or experienced change in No Accompanied By: self activities of daily living that may affect Transfer Assistance: None risk of falls: Patient Identification Verified: Yes Signs or symptoms of abuse/neglect since last No Secondary Verification Process Yes visito Completed: Hospitalized since last visit: No Patient Requires Transmission- No Implantable device outside of the clinic excluding No Based Precautions: cellular tissue based products placed in the center Patient Has Alerts: Yes since last visit: Patient Alerts: Right ABI .62 TBI Pain Present Now: No refused Left ABI .60 TBI .14 Electronic Signature(s) Signed: 12/10/2019 4:26:43 PM By: [name] EMT/HBOT Entered By: [name] on 12/10/2019 14:13:39 -------------------------------------------------------------------------------- Encounter Discharge Information Details Patient Name: Date of Service: [name] 12/10/2019 2:00 PM Medical Record Number:[id number] Patient Account Number: [id number] Date of Birth/Sex: Treating RN: [birth year] (79 y.o. [name], [name] Primary Care Meko Bellanger: [name] Other Clinician: [name] Referring Novalyn Lajara: Treating Tyreona Panjwani/[name]:[name], [name], [name] in Treatment: 21 Encounter Discharge  Information Items Discharge Condition: Stable Ambulatory Status: Walker Discharge Destination: Home Transportation: Private Auto Accompanied By: self Schedule Follow-up Appointment: Yes Clinical Summary of Care: Patient Declined Electronic Signature(s) Signed: 12/10/2019 4:26:43 PM By: [name] EMT/HBOT Entered By: [name] on 12/10/2019 16:25:13 -------------------------------------------------------------------------------- Patient/Caregiver Education Details Patient Name: Date of Service: Bynum, Shavon H. 3/18/2021andnbsp2:00 PM Medical Record Patient Account Number: [id number] Number: Treating RN: Deon Pilling Date of Birth/Gender: 1941/02/03 (78 y.o. F) Other Clinician: [name] Primary Care Physician: Verna Czech Referring Physician: Physician/[name]: Darl Pikes in Treatment: 21 Education Assessment Education Provided To: Patient Education Topics Provided Hyperbaric Oxygenation: Methods: Explain/Verbal Responses: State content correctly Electronic Signature(s) Signed: 12/10/2019 4:26:43 PM By: [name] EMT/HBOT Entered By: [name] on 12/10/2019 16:24:51 -------------------------------------------------------------------------------- Vitals Details Patient Name: Date of Service: [name] 12/10/2019 2:00 PM Medical Record Number:[id number] Patient Account Number: [id number] Date of Birth/Sex: Treating RN: 1941/05/04 (79 y.o. [name], [name] Primary Care Ebony Rickel: [name] Other Clinician: [name] Referring Vanderbilt Ranieri: Treating Sherial Ebrahim/[name]:[name], [name], [name] Weeks in Treatment: 21 Vital Signs Time Taken: 13:55 Temperature (F): 97.9 Height (in): 64 Pulse (bpm): 68 Weight (lbs): 142 Respiratory Rate (breaths/min): 15 Body Mass Index (BMI): 24.4 Blood Pressure (mmHg): 124/38 Capillary Blood Glucose (mg/dl): 189 Reference  Range: 80 - 120 mg / dl Electronic Signature(s) Signed: 12/10/2019 4:26:43 PM By: [name] EMT/HBOT Entered By: [name] on 12/10/2019 14:14:04

## 2019-12-10 NOTE — Progress Notes (Addendum)
[name], [name] ([id number]) Visit Report for 12/10/2019 HBO Details Patient Name: Date of Service: [name], [name] 12/10/2019 2:00 PM Medical Record Number:[id number] Patient Account Number: [id number] Date of Birth/Sex: Treating RN: 22-May-1941 (79 y.o. [name], [name] Primary Care Willmar Stockinger: [name] Other Clinician: [name] Referring Romy Mcgue: Treating Aariya Ferrick/[name]:[name], [name], [name] in Treatment: 21 HBO Treatment Course Details Treatment Course Number: 1 Ordering Ilse Billman: [name] Total Treatments Ordered: 40 HBO Treatment Start Date: 10/12/2019 HBO Indication: Diabetic Ulcer(s) of the Lower Extremity HBO Treatment Details Treatment Number: 36 Patient Type: Outpatient Chamber Type: Monoplace Chamber Serial #: [id number] Treatment Protocol: 2.5 ATA with 90 minutes oxygen, with two 5 minute air breaks Treatment Details Compression Rate Down: 2.0 psi / minute De-Compression Rate Up: 2.0 psi / minute Air breaks and CompressTx Pressure breathing periods DecompressDecompress Begins Reached (leave unused spaces Begins Ends blank) Chamber Pressure (ATA)1 2.5 2.5 2.5 2.5 2.5 --2.5 1 Clock Time (24 hr) 14:10 14:22 [id number]:50--09:38 16:14 Treatment Length: 124 (minutes) Treatment Segments: 4 Vital Signs Capillary Blood Glucose Reference Range: 80 - 120 mg / dl HBO Diabetic Blood Glucose Intervention Range: <131 mg/dl or >249 mg/dl Time Vitals Blood Respiratory Capillary Blood Glucose Pulse Action Type: Pulse: Temperature: Taken: Pressure: Rate: Glucose (mg/dl): Meter #: Oximetry (%) Taken: Pre 13:55 124/38 68 15 97.9 189 Post 16:17 134/53 63 15 98.2 143 Treatment Response Treatment Toleration: Well Treatment Completion Treatment Completed without Adverse Event Status: Additional Procedure Documentation Tissue Sevierity: Necrosis of bone Joy Reiger Notes no concerns or treatment given Physician HBO Attestation: I  certify that I supervised this HBO treatment in accordance with Medicare guidelines. A trained Yes emergency response team is readily available per hospital policies and procedures. Continue HBOT as ordered. Yes Electronic Signature(s) Signed: 12/10/2019 7:02:26 PM By: [name] MD Previous Signature: 12/10/2019 4:26:43 PM Version By: [name] EMT/HBOT Entered By: [name] on 12/10/2019 18:59:59 -------------------------------------------------------------------------------- HBO Safety Checklist Details Patient Name: Date of Service: [name] 12/10/2019 2:00 PM Medical Record Number:[id number] Patient Account Number: [id number] Date of Birth/Sex: Treating RN: 1941/04/27 (79 y.o. [name], [name] Primary Care Shamicka Inga: [name] Other Clinician: [name] Referring Kyheem Bathgate: Treating Asami Lambright/[name]:[name], [name], [name] Weeks in Treatment: 21 HBO Safety Checklist Items Safety Checklist Consent Form Signed Patient voided / foley secured and emptied When did you last eato 1200 - rice soup / chicken salad Last dose of injectable or oral agent metformin NA Ostomy pouch emptied and vented if applicable NA All implantable devices assessed, documented and approved NA Intravenous access site secured and place Valuables secured Linens and cotton and cotton/polyester blend (less than 51% polyester) Personal oil-based products / skin lotions / body lotions removed Wigs or hairpieces removed NA Smoking or tobacco materials removed Books / newspapers / magazines / loose paper removed Cologne, aftershave, perfume and deodorant removed Jewelry removed (may wrap wedding band) Make-up removed Hair care products removed NA Battery operated devices (external) removed NA Heating patches and chemical warmers removed NA Titanium eyewear removed Nail polish cured greater than 10 hours NA Casting material cured greater than 10 hours NA  Hearing aids removed NA Loose dentures or partials removed NA Prosthetics have been removed Patient demonstrates correct use of air break device (if applicable) Patient concerns have been addressed Patient grounding bracelet on and cord attached to chamber Specifics for Inpatients (complete in addition to above) Medication sheet sent with patient Intravenous medications needed or due during therapy sent with patient Drainage tubes (e.g. nasogastric tube or chest  tube secured and vented) Endotracheal or Tracheotomy tube secured Cuff deflated of air and inflated with saline Airway suctioned Electronic Signature(s) Signed: 12/10/2019 2:15:09 PM By: [name] EMT/HBOT Entered By: [name] on 12/10/2019 14:15:08

## 2019-12-11 ENCOUNTER — Encounter (HOSPITAL_BASED_OUTPATIENT_CLINIC_OR_DEPARTMENT_OTHER): Payer: Medicare Other | Admitting: Internal Medicine

## 2019-12-11 ENCOUNTER — Other Ambulatory Visit: Payer: Self-pay

## 2019-12-11 DIAGNOSIS — L97522 Non-pressure chronic ulcer of other part of left foot with fat layer exposed: Secondary | ICD-10-CM | POA: Diagnosis not present

## 2019-12-11 DIAGNOSIS — L97512 Non-pressure chronic ulcer of other part of right foot with fat layer exposed: Secondary | ICD-10-CM | POA: Diagnosis not present

## 2019-12-11 DIAGNOSIS — M86371 Chronic multifocal osteomyelitis, right ankle and foot: Secondary | ICD-10-CM | POA: Diagnosis not present

## 2019-12-11 DIAGNOSIS — L97514 Non-pressure chronic ulcer of other part of right foot with necrosis of bone: Secondary | ICD-10-CM | POA: Diagnosis not present

## 2019-12-11 DIAGNOSIS — L97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 limited to breakdown of skin: Secondary | ICD-10-CM | POA: Diagnosis not present

## 2019-12-11 DIAGNOSIS — E1142 Type 2 diabetes mellitus with diabetic polyneuropathy: Secondary | ICD-10-CM | POA: Diagnosis not present

## 2019-12-11 DIAGNOSIS — E11621 Type 2 diabetes mellitus with foot ulcer: Secondary | ICD-10-CM | POA: Diagnosis not present

## 2019-12-11 DIAGNOSIS — L97528 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ity: Secondary | ICD-10-CM | POA: Diagnosis not present

## 2019-12-11 LAB — GLUCOSE, CAPILLARY
Glucose-Capillary: 105 mg/dL — ABNORMAL HIGH (ref 70–99)
Glucose-Capillary: 89 mg/dL (ref 70–99)

## 2019-12-11 NOTE — Progress Notes (Addendum)
[name], [name] ([id number]) Visit Report for 12/11/2019 Arrival Information Details Patient Name: Date of Service: [name] 12/11/2019 4:00 PM Medical Record Number:[id number] Patient Account Number: [id number] Date of Birth/Sex: Treating RN: August 23, 1941 (79 y.o. [name] Primary Care Catricia Scheerer: [name] Other Clinician: Referring Rashod Gougeon: Treating Jadin Creque/[name]:[name], [name], [name] Weeks in Treatment: 21 Visit Information History Since Last Visit All ordered tests and consults were completed: No Patient Arrived: Ambulatory Added or deleted any medications: No Arrival Time: 16:38 Any new allergies or adverse reactions: No Accompanied By: self Had a fall or experienced change in No Transfer Assistance: None activities of daily living that may affect Patient Identification Verified: Yes risk of falls: Secondary Verification Process Yes Signs or symptoms of abuse/neglect since last No Completed: visito Patient Requires Transmission- No Hospitalized since last visit: No Based Precautions: Implantable device outside of the clinic excluding No Patient Has Alerts: Yes cellular tissue based products placed in the center Patient Alerts: Right ABI .62 TBI since last visit: refused Has Dressing in Place as Prescribed: Yes Left ABI .60 TBI Has Compression in Place as Prescribed: Yes .14 Pain Present Now: No Electronic Signature(s) Signed: 12/11/2019 5:08:14 PM By: Carlene Coria RN Entered By: Carlene Coria on 12/11/2019 16:38:56 -------------------------------------------------------------------------------- Encounter Discharge Information Details Patient Name: Date of Service: [name] 12/11/2019 4:00 PM Medical Record Number:[id number] Patient Account Number: [id number] Date of Birth/Sex: Treating RN: [birth year] (79 y.o. [name] Primary Care Derelle Cockrell: [name] Other Clinician: Referring Emmeline Winebarger: Treating  Iva Montelongo/[name]:[name], [name], [name] Weeks in Treatment: 4 Encounter Discharge Information Items Post Procedure Vitals Discharge Condition: Stable Temperature (F): 98.6 Ambulatory Status: Cane Pulse (bpm): 63 Discharge Destination: Home Respiratory Rate (breaths/min): 18 Transportation: Private Auto Blood Pressure (mmHg): 162/40 Accompanied By: self Schedule Follow-up Appointment: Yes Clinical Summary of Care: Electronic Signature(s) Signed: 12/11/2019 5:19:35 PM By: Deon Pilling Entered By: Deon Pilling on 12/11/2019 17:19:17 -------------------------------------------------------------------------------- Lower Extremity Assessment Details Patient Name: Date of Service: [name] 12/11/2019 4:00 PM Medical Record Number:[id number] Patient Account Number: [id number] Date of Birth/Sex: Treating RN: Jun 07, 1941 (79 y.o. [name] Primary Care Maygan Koeller: [name] Other Clinician: Referring Arnold Depinto: Treating Collin Rengel/[name]:[name], [name], [name] Weeks in Treatment: 21 Edema Assessment Assessed: [Left: No] [Right: No] Edema: [Left: No] [Right: No] Calf Left: Right: Point of Measurement: 39 cm From Medial Instep 30 cm 28 cm Ankle Left: Right: Point of Measurement: 10 cm From Medial Instep 20 cm 18 cm Electronic Signature(s) Signed: 12/11/2019 5:08:14 PM By: Carlene Coria RN Entered By: Carlene Coria on 12/11/2019 16:41:59 -------------------------------------------------------------------------------- Multi Wound Chart Details Patient Name: Date of Service: [name] 12/11/2019 4:00 PM Medical Record Number:[id number] Patient Account Number: [id number] Date of Birth/Sex: Treating RN: 28-Dec-1940 (79 y.o. [name] Primary Care Myrlene Riera: [name] Other Clinician: Referring Kingsley Farace: Treating Leman Martinek/[name]:[name], [name], [name] Weeks in Treatment: 21 Vital Signs Height(in):  64 Pulse(bpm): 60 Weight(lbs): 142 Blood Pressure(mmHg): 162/40 Body Mass Index(BMI): 24 Temperature(F): 98.6 Respiratory 18 Rate(breaths/min): Photos: [1R:No Photos] [7:No Photos] [8R:No Photos] Wound Location: [1R:Left Toe Fourth - DorsalRight Amputation Site -] [7:Transmetatarsal] [8R:Left Toe Third] Wounding Event: [1R:Blister] [7:Surgical Injury] [8R:Gradually Appeared] 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] [7:Lower Extremity] [8R:Lower Extremity] 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 N/A] [8R:N/A] Comorbid History: [1R:Deep Vein Thrombosis, Hypertension, Peripheral Hypertension, Peripheral Hypertension, Peripheral Arterial Disease, Type II Diabetes] [7:Deep Vein Thrombosis, Arterial Disease, Type II Arterial Disease, Type II Diabetes] [8R:Deep  Vein Thrombosis, Diabetes] Date  Acquired: [1R:09/16/2018] [7:08/07/2019] [8R:09/10/2019] Weeks of Treatment: [1R:21] [7:15] [8R:13] Wound Status: [1R:Open] [7:Open] [8R:Open] Wound Recurrence: [1R:Yes] [7:No] [8R:Yes] Measurements L x W x D 0.2x0.3x0.2 [7:0.2x0.7x0.1] [8R:0.1x0.2x0.1] (cm) Area (cm) : [1R:0.047] [7:0.11] [8R:0.016] Volume (cm) : [7W:6.203] [7:0.011] [8R:0.002] % Reduction in Area: [1R:93.40%] [7:98.90%] [8R:96.40%] % Reduction in Volume: 87.30% [7:98.90%] [8R:95.50%] Classification: [1R:Grade 2] [7:Grade 4] [8R:Grade 2] Exudate Amount: [1R:Small] [7:Medium] [8R:None Present] Exudate Type: [1R:Serosanguineous] [7:Serosanguineous] [8R:N/A] Exudate Color: [1R:red, brown] [7:red, brown] [8R:N/A] Wound Margin: [1R:Distinct, outline attached Well defined, not attached Distinct, outline attached] Granulation Amount: [1R:Large (67-100%)] [7:Medium (34-66%)] [8R:Large (67-100%)] Granulation Quality: [1R:Pink] [7:Pink] [8R:Pink] Necrotic Amount: [1R:None Present (0%)] [7:Medium (34-66%)] [8R:None Present (0%)] Exposed  Structures: [1R:Fat Layer (Subcutaneous Fat Layer (Subcutaneous Fat Layer (Subcutaneous Tissue) Exposed: Yes Fascia: No Tendon: No Muscle: No Joint: No Bone: No] [7:Tissue) Exposed: Yes Fascia: No Tendon: No Muscle: No Joint: No Bone: No] [8R:Tissue) Exposed: Yes  Fascia: No Tendon: No Muscle: No Joint: No Bone: No] Epithelialization: [1R:None] [7:Small (1-33%)] [8R:Large (67-100%)] Debridement: [1R:N/A] [7:Debridement - Selective/Open Wound] [8R:Debridement - Selective/Open Wound] Pre-procedure [1R:N/A] [7:16:55] [8R:16:55] Verification/Time Out Taken: Pain Control: [1R:N/A] [7:Other] [8R:Other] Level: [1R:N/A] [7:Skin/Dermis] [8R:Skin/Dermis] Debridement Area (sq cm):N/A [7:0.14] [8R:0.02] Instrument: [1R:N/A] [7:Curette] [8R:Curette] Bleeding: [1R:N/A] [7:Minimum] [8R:Minimum] Hemostasis Achieved: [1R:N/A] [7:Pressure] [8R:Pressure] Procedural Pain: [1R:N/A] [7:0] [8R:0] Post Procedural Pain: [1R:N/A] [7:0] [8R:0] Debridement Treatment [1R:N/A] [7:Procedure was tolerated] [8R:Procedure was tolerated] Response: [7:well] [8R:well] Post Debridement [1R:N/A] [7:0.2x0.7x0.1] [8R:0.1x0.2x0.1] Measurements L x W x D (cm) Post Debridement [1R:N/A] [7:0.011] [8R:0.002] Volume: (cm) Procedures Performed: [1R:N/A] [7:Debridement] [8R:Debridement] Treatment Notes Electronic Signature(s) Signed: 12/11/2019 5:09:02 PM By: [name] MD Signed: 12/11/2019 5:10:17 PM By: Kela Millin Entered By: [name] on 12/11/2019 17:02:49 -------------------------------------------------------------------------------- Multi-Disciplinary Care Plan Details Patient Name: Date of Service: [name] 12/11/2019 4:00 PM Medical Record Number:[id number] Patient Account Number: [id number] Date of Birth/Sex: Treating RN: Jan 26, 1941 (79 y.o. 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 Potential for barotraumas to ears, sinuses, teeth, and lungs or cerebral gas embolism related to changes in atmospheric pressure inside hyperbaric oxygen chamber Goals: Barotrauma will be prevented during HBO2 Date Initiated: 10/01/2019 Target Resolution Date: 01/08/2020 Goal Status: Active Patient and/or family will be able to state/discuss factors appropriate to the management of their disease process during treatment Date Initiated: 10/01/2019 Target Resolution Date: 01/08/2020 Goal Status: Active Patient/caregiver will verbalize understanding of HBO goals, rationale, procedures and potential hazards Date Initiated: 10/01/2019 Date Inactivated: 10/22/2019 Target Resolution Date: 10/30/2019 Goal Status: Met Interventions: Assess and provide for patients comfort related to the hyperbaric environment and equalization of middle ear Assess patient for any history of confinement anxiety Notes: Abuse / Safety / Falls / Self Care Management Nursing Diagnoses: Potential for falls Goals: Patient will remain injury free related to falls Date Initiated: 08/27/2019 Target Resolution Date: 01/08/2020 Goal Status: Active Interventions: Provide education on fall prevention Notes: Pain, Acute or Chronic Nursing Diagnoses: Pain, acute or chronic: actual or potential Potential alteration in comfort, pain Goals: Patient will verbalize adequate pain control and receive pain control interventions during procedures as needed Date Initiated: 08/27/2019 Target Resolution Date: 01/08/2020 Goal Status: Active Interventions: Encourage patient to take pain medications as prescribed Provide education on pain management Reposition patient for comfort Notes: Electronic Signature(s) Signed: 12/11/2019 5:10:17 PM By: Kela Millin Entered By: Kela Millin on  12/11/2019 16:28:01 -------------------------------------------------------------------------------- Pain Assessment Details Patient Name: Date of Service: KILA, GODINA 12/11/2019 4:00 PM Medical Record Number:[id number] Patient Account Number: [id number] Date of Birth/Sex: Treating RN: 09-08-1941 (79 y.o. [name] Primary Care Carlene Bickley: [name] Other Clinician: Referring Roxanne Panek: Treating Katalin Colledge/[name]:[name], [name], [name] Weeks in Treatment: 21 Active Problems Location of Pain Severity and Description of Pain Patient Has Paino No Patient Has Paino No Site Locations Pain Management and Medication Current Pain Management: Electronic Signature(s) Signed: 12/11/2019 5:08:14 PM By: Carlene Coria RN Entered By: Carlene Coria on 12/11/2019 16:39:27 -------------------------------------------------------------------------------- Patient/Caregiver Education Details Patient Name: Date of Service: Benfer, Janiel H. 3/19/2021andnbsp4:00 PM Medical Record Number:[id number] Patient Account Number: [id number] Date of Birth/Gender: Treating RN: Jul 15, 1941 (79 y.o. [name] Primary Care Physician: [name] Other Clinician: Referring Physician: Treating Physician/[name]:[name], [name], [name] in Treatment: 21 Education Assessment Education Provided To: Patient Education Topics Provided Pain: Methods: Explain/Verbal Responses: State content correctly Safety: Methods: Explain/Verbal Responses: State content correctly Electronic Signature(s) Signed: 12/11/2019 5:10:17 PM By: Kela Millin Entered By: Kela Millin on 12/11/2019 16:28:20 -------------------------------------------------------------------------------- Wound Assessment Details Patient Name: Date of Service: [name] 12/11/2019 4:00 PM Medical Record Number:[id number] Patient Account  Number: [id number] Date of Birth/Sex: Treating RN: 1940/12/03 (79 y.o. [name] Primary Care Kelcy Baeten: [name] Other Clinician: Referring Abbegale Stehle: Treating Zander Ingham/[name]:[name], [name], [name] Weeks in Treatment: 21 Wound Status Wound Number: 1R Primary Diabetic Wound/Ulcer of the Lower Extremity Etiology: Wound Location: Left Toe Fourth - Dorsal Secondary Arterial Insufficiency Ulcer Wounding Event: Blister Etiology: Date Acquired: 09/16/2018 Wound Open Weeks Of Treatment: 21 Status: Clustered Wound: No Comorbid Deep Vein Thrombosis, Hypertension, History: Peripheral Arterial Disease, Type II Diabetes Photos Wound Measurements Length: (cm) 0.2 % Reduction i Width: (cm) 0.3 % Reduction i Depth: (cm) 0.2 Epithelializa Area: (cm) 0.047 Tunneling: Volume: (cm) 0.009 Undermining: Wound Description Classification: Grade 2 Foul Odor Aft Wound Margin: Distinct, outline attached Slough/Fibrin Exudate Amount: Small Exudate Type: Serosanguineous Exudate Color: red, brown Wound Bed Granulation Amount: Large (67-100%) Granulation Quality: Pink Fascia Expose Necrotic Amount: None Present (0%) Fat Layer (Su Tendon Expose Muscle Expose Joint Exposed Bone Exposed: Treatment Notes Wound #1R (Left, Dorsal Toe Fourth) 1. Cleanse With Wound Cleanser 3. Primary Dressing Applied Collegen AG Hydrogel or K-Y Jelly 4. Secondary Dressing Dry Gauze Roll Gauze 5. Secured With Medipore tape 7. Footwear/Offloading device applied Surgical shoe er Cleansing: No o No Exposed Structure d: No bcutaneous Tissue) Exposed: Yes d: No d: No : No No n Area: 93.4% n Volume: 87.3% tion: None No No Electronic Signature(s) Signed: 12/15/2019 3:59:58 PM By: [name] EMT/HBOT Signed: 12/15/2019 5:48:19 PM By: Kela Millin Previous Signature: 12/11/2019 5:08:14 PM Version By: Carlene Coria RN Entered By: [name] on 12/15/2019  11:18:43 -------------------------------------------------------------------------------- Wound Assessment Details Patient Name: Date of Service: [name] 12/11/2019 4:00 PM Medical Record Number:[id number] Patient Account Number: [id number] Date of Birth/Sex: Treating RN: 1941-06-09 (79 y.o. 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 Wound Open Status: Wounding Event: Surgical Injury Comorbid Deep Vein Thrombosis, Hypertension, Date Acquired: 08/07/2019 History: Peripheral Arterial Disease, Type II Diabetes Weeks Of Treatment: 15 Clustered Wound: No Photos Wound Measurements Length: (cm) 0.2 % Reducti Width: (cm) 0.7 % Reducti Depth: (cm) 0.1 Epithelia Area: (cm) 0.11 Tunnelin Volume: (cm) 0.011 Undermin  Wound Description Classification: Grade 4 Wound Margin: Well defined, not attached Exudate Amount: Medium Exudate Type: Serosanguineous Exudate Color: red, brown Wound Bed Granulation Amount: Medium (34-66%) Granulation Quality: Pink Necrotic Amount: Medium (34-66%) Necrotic Quality: Adherent Slough Foul Odor After Cleansing: No Slough/Fibrino Yes Exposed Structure Fascia Exposed: No Fat Layer (Subcutaneous Tissue) Exposed: Yes Tendon Exposed: No Muscle Exposed: No Joint Exposed: No Bone Exposed: No on in Area: 98.9% on in Volume: 98.9% lization: Small (1-33%) g: No ing: No Treatment Notes 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 Cleanse With Wound Cleanser 3. Primary Dressing Applied Collegen AG Hydrogel or K-Y Jelly 4. Secondary Dressing ABD Pad Dry Gauze Roll Gauze 5. Secured With Medipore tape 7. Footwear/Offloading device  applied Multipodus Splint/Boot Electronic Signature(s) Signed: 12/15/2019 3:59:58 PM By: [name] EMT/HBOT Signed: 12/15/2019 5:48:19 PM By: Kela Millin Previous Signature: 12/11/2019 5:08:14 PM Version By: Carlene Coria RN Entered By: [name] on 12/15/2019 11:19:09 -------------------------------------------------------------------------------- Wound Assessment Details Patient Name: Date of Service: [name] 12/11/2019 4:00 PM Medical Record Number:[id number] Patient Account Number: [id number] Date of Birth/Sex: Treating RN: 10-Oct-1940 (79 y.o. [name] Primary Care Barbara Ahart: [name] Other Clinician: Referring Gaynor Genco: Treating Garrison Michie/[name]:[name], [name], [name] Weeks in Treatment: 21 Wound Status Wound Number: 8R Primary Diabetic Wound/Ulcer of the Lower Extremity Etiology: Wound Location: Left Toe Third Wound Open Wounding Event: Gradually Appeared Status: Date Acquired: 09/10/2019 Comorbid Deep Vein Thrombosis, Hypertension, Weeks Of Treatment: 13 History: Peripheral Arterial Disease, Type II Diabetes Clustered Wound: No Photos Wound Measurements Length: (cm) 0.1 Width: (cm) 0.2 Depth: (cm) 0.1 Area: (cm) 0.016 Volume: (cm) 0.002 Wound Description Classification: Grade 2 Wound Margin: Distinct, outline attached Exudate Amount: None Present Wound Bed Granulation Amount: Large (67-100%) Granulation Quality: Pink Necrotic Amount: None Present (0%) ter Cleansing: No no No Exposed Structure ed: No ubcutaneous Tissue) Exposed: Yes ed: No ed: No d: No : No % Reduction in Area: 96.4% % Reduction in Volume: 95.5% Epithelialization: Large (67-100%) Tunneling: No Undermining: No Foul Odor Af Slough/Fibri Fascia Expos Fat Layer (S Tendon Expos Muscle Expos Joint Expose Bone Exposed Treatment Notes Wound #8R (Left Toe Third) 1. Cleanse With Wound Cleanser 3. Primary Dressing Applied Collegen  AG Hydrogel or K-Y Jelly 4. Secondary Dressing Dry Gauze Roll Gauze 5. Secured With Medipore tape 7. Footwear/Offloading device applied Surgical shoe Electronic Signature(s) Signed: 12/15/2019 3:59:58 PM By: [name] EMT/HBOT Signed: 12/15/2019 5:59:23 PM By: Carlene Coria RN Previous Signature: 12/11/2019 5:08:14 PM Version By: Carlene Coria RN Entered By: [name] on 12/15/2019 11:19:46 -------------------------------------------------------------------------------- Vitals Details Patient Name: Date of Service: [name] 12/11/2019 4:00 PM Medical Record Number:[id number] Patient Account Number: [id number] Date of Birth/Sex: Treating RN: 29-Jul-1941 (79 y.o. [name] Primary Care Cristian Davitt: [name] Other Clinician: Referring Juan Kissoon: Treating Justus Duerr/[name]:[name], [name], [name] Weeks in Treatment: 21 Vital Signs Time Taken: 16:39 Temperature (F): 98.6 Height (in): 64 Pulse (bpm): 63 Weight (lbs): 142 Respiratory Rate (breaths/min): 18 Body Mass Index (BMI): 24.4 Blood Pressure (mmHg): 162/40 Reference Range: 80 - 120 mg / dl Electronic Signature(s) Signed: 12/11/2019 5:08:14 PM By: Carlene Coria RN Entered By: Carlene Coria on 12/11/2019 16:39:19

## 2019-12-11 NOTE — Progress Notes (Addendum)
[name], [name] ([id number]) Visit Report for 12/11/2019 HBO Details Patient Name: Date of Service: [name], [name] 12/11/2019 2:00 PM Medical Record Number:[id number] Patient Account Number: [id number] Date of Birth/Sex: Treating RN: 06-07-1941 (79 y.o. Hollie Salk, Shannon Primary [facility] Bolton Canupp: [name] Other Clinician: [name] Referring Johanna Stafford: Treating Joyce Heitman/[name]:[name], [name], [name] in Treatment: 21 HBO Treatment Course Details Treatment Course Number: 1 Ordering Toshiko Kemler: [name] Total Treatments Ordered: 40 HBO Treatment Start Date: 10/12/2019 HBO Indication: Diabetic Ulcer(s) of the Lower Extremity HBO Treatment Details Treatment Number: 37 Patient Type: Outpatient Chamber Type: Monoplace Chamber Serial #: [id number] Treatment Protocol: 2.5 ATA with 90 minutes oxygen, with two 5 minute air breaks Treatment Details Compression Rate Down: 2.0 psi / minute De-Compression Rate Up: 2.0 psi / minute Air breaks and CompressTx Pressure breathing periods DecompressDecompress Begins Reached (leave unused spaces Begins Ends blank) Chamber Pressure (ATA)1 2.5 2.5 2.5 2.5 2.5 --2.5 1 Clock Time (24 hr) 14:11 14:23 [id number]--64:33 16:15 Treatment Length: 124 (minutes) Treatment Segments: 4 Vital Signs Capillary Blood Glucose Reference Range: 80 - 120 mg / dl HBO Diabetic Blood Glucose Intervention Range: <131 mg/dl or >249 mg/dl Time Vitals Blood Respiratory Capillary Blood Glucose Pulse Action Type: Pulse: Temperature: Taken: Pressure: Rate: Glucose (mg/dl): Meter #: Oximetry (%) Taken: Pre 13:55 162/40 63 17 98.6 125 Post 16:17 136/48 67 15 98.2 89 Treatment Response Treatment Toleration: Well Treatment Completion Treatment Completed without Adverse Event Status: Additional Procedure Documentation Tissue Sevierity: Necrosis of bone [place] Notes No concerns with treatment given Physician HBO  Attestation: I certify that I supervised this HBO treatment in accordance with Medicare guidelines. A trained Yes emergency response team is readily available per hospital policies and procedures. Continue HBOT as ordered. Yes Electronic Signature(s) Signed: 12/11/2019 5:09:02 PM By: [name] MD Previous Signature: 12/11/2019 4:38:27 PM Version By: [name] EMT/HBOT Entered By: [name] on 12/11/2019 16:40:11 -------------------------------------------------------------------------------- HBO Safety Checklist Details Patient Name: Date of Service: [name] 12/11/2019 2:00 PM Medical Record Number:[id number] Patient Account Number: [id number] Date of Birth/Sex: Treating RN: Feb 23, 1941 (79 y.o. Hollie Salk, Shannon Primary Care Galan Ghee: [name] Other Clinician: [name] Referring Armoni Depass: Treating Sylver Vantassell/[name]:[name], [name], [name] Weeks in Treatment: 21 HBO Safety Checklist Items Safety Checklist Consent Form Signed Patient voided / foley secured and emptied When did you last eato 1200 - rice soup Last dose of injectable or oral agent metformin NA Ostomy pouch emptied and vented if applicable NA All implantable devices assessed, documented and approved NA Intravenous access site secured and place Valuables secured Linens and cotton and cotton/polyester blend (less than 51% polyester) Personal oil-based products / skin lotions / body lotions removed Wigs or hairpieces removed NA Smoking or tobacco materials removed Books / newspapers / magazines / loose paper removed Cologne, aftershave, perfume and deodorant removed Jewelry removed (may wrap wedding band) Make-up removed Hair care products removed NA Battery operated devices (external) removed NA Heating patches and chemical warmers removed NA Titanium eyewear removed Nail polish cured greater than 10 hours NA Casting material cured greater than 10 hours NA  Hearing aids removed NA Loose dentures or partials removed NA Prosthetics have been removed Patient demonstrates correct use of air break device (if applicable) Patient concerns have been addressed Patient grounding bracelet on and cord attached to chamber Specifics for Inpatients (complete in addition to above) Medication sheet sent with patient Intravenous medications needed or due during therapy sent with patient Drainage tubes (e.g. nasogastric tube or chest tube secured and  vented) Endotracheal or Tracheotomy tube secured Cuff deflated of air and inflated with saline Airway suctioned Electronic Signature(s) Signed: 12/11/2019 2:33:08 PM By: [name] EMT/HBOT Entered By: [name] on 12/11/2019 14:33:07

## 2019-12-11 NOTE — Progress Notes (Signed)
LORAE, ROIG ([id number]) Visit Report for 12/11/2019 Debridement Details Patient Name: Date of Service: RUBEE, VEGA 12/11/2019 4:00 PM Medical Record Number:[id number] Patient Account Number: [id number] Date of Birth/Sex: Treating RN: May 25, 1941 (79 y.o. [name] Primary Care Provider: [name] Other Clinician: Referring Provider: Treating Provider/[name]:[name], [name], [name] Weeks in Treatment: 21 Debridement Performed for Wound #7 Right Amputation Site - Transmetatarsal Assessment: Performed By: Physician Ricard Dillon., MD Debridement Type: Debridement Severity of Tissue Pre Fat layer exposed Debridement: Level of Consciousness (Pre- Awake and Alert procedure): Pre-procedure Verification/Time Out Taken: Yes - 16:55 Start Time: 16:55 Pain Control: Other : benzocaine, 20% Total Area Debrided (L x W): 0.2 (cm) x 0.7 (cm) = 0.14 (cm) Tissue and other material Viable, Non-Viable, Skin: Dermis debrided: Level: Skin/Dermis Debridement Description: Selective/Open Wound Instrument: Curette Bleeding: Minimum Hemostasis Achieved: Pressure End Time: 16:56 Procedural Pain: 0 Post Procedural Pain: 0 Response to Treatment: Procedure was tolerated well Level of Consciousness Awake and Alert (Post-procedure): Post Debridement Measurements of Total Wound Length: (cm) 0.2 Width: (cm) 0.7 Depth: (cm) 0.1 Volume: (cm) 0.011 Character of Wound/Ulcer Post Improved Debridement: Severity of Tissue Post Debridement: Fat layer exposed Post Procedure Diagnosis Same as Pre-procedure Electronic Signature(s) Signed: 12/11/2019 5:09:02 PM By: [name] MD Signed: 12/11/2019 5:10:17 PM By: Kela Millin Entered By: [name] on 12/11/2019 17:03:03 -------------------------------------------------------------------------------- Debridement Details Patient Name: Date of Service: [name] 12/11/2019 4:00 PM Medical  Record Number:[id number] Patient Account Number: [id number] Date of Birth/Sex: Treating RN: 11/11/1940 (79 y.o. [name] Primary Care Provider: [name] Other Clinician: Referring Provider: Treating Provider/[name]:[name], [name], [name] Weeks in Treatment: 21 Debridement Performed for Wound #8R Left Toe Third Assessment: Performed By: Physician Ricard Dillon., MD Debridement Type: Debridement Severity of Tissue Pre Fat layer exposed Debridement: Level of Consciousness (Pre- Awake and Alert procedure): Pre-procedure Yes - 16:55 Verification/Time Out Taken: Start Time: 16:55 Pain Control: Other : benzocaine, 20% Total Area Debrided (L x W): 0.1 (cm) x 0.2 (cm) = 0.02 (cm) Tissue and other material Viable, Non-Viable, Skin: Dermis debrided: Level: Skin/Dermis Debridement Description: Selective/Open Wound Instrument: Curette Bleeding: Minimum Hemostasis Achieved: Pressure End Time: 16:56 Procedural Pain: 0 Post Procedural Pain: 0 Response to Treatment: Procedure was tolerated well Level of Consciousness Awake and Alert (Post-procedure): Post Debridement Measurements of Total Wound Length: (cm) 0.1 Width: (cm) 0.2 Depth: (cm) 0.1 Volume: (cm) 0.002 Character of Wound/Ulcer Post Improved Debridement: Severity of Tissue Post Debridement: Fat layer exposed Post Procedure Diagnosis Same as Pre-procedure Electronic Signature(s) Signed: 12/11/2019 5:09:02 PM By: [name] MD Signed: 12/11/2019 5:10:17 PM By: Kela Millin Entered By: [name] on 12/11/2019 17:03:11 -------------------------------------------------------------------------------- HPI Details Patient Name: Date of Service: [name] 12/11/2019 4:00 PM Medical Record Number:[id number] Patient Account Number: [id number] Date of Birth/Sex: Treating RN: 26-Feb-1941 (79 y.o. [name] Primary Care Provider: [name] Other  Clinician: Referring Provider: Treating Provider/[name]:[name], [name], [name] Weeks in Treatment: 21 History of Present Illness HPI Description: ADMISSION 07/13/2019 Patient is a 79 year old type II diabetic. She has known PAD. She has been followed by Dr. Jacqualyn Posey of podiatry for blistering areas on her toes dating back to 04/30/2019 which at this was the left fourth toe. By 8/11 she had wounds on the right and left second toes. She underwent arterial studies by Dr. Alvester Chou on 7/31 that showed ABIs on the right of 0.60 TBI of 0.26 on the left ABI of 0.56 and a TBI of 0.25. By 9/10 she had  ischemic-looking wounds per Dr. Earleen Newport on the right first, left first second and third. She has been using Medihoney and then mupirocin more recently simply Betadine. The patient underwent angiography by Dr. Gwenlyn Found on 9/21. 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 Noted that there was 1 vessel runoff in the foot via the peroneal. On the left there was an 80% left SFA, 70% mid left SFA. There was a short segment calcified CTO distal less than SFA and above-knee popliteal artery reconstituting with two-vessel runoff. 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 It was felt that she had bilateral SFA disease as well as tibial vessel disease. An attempt was made to revascularize the left SFA but they were unable to cross because of the highly calcified nature of the lesion. 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 She has an area on the medial left fourth toe with raised horned callus on top of this. I am not certain what this represents. With regards to pain she has about a 2-1/2 I will claudication tolerance in the grocery store. She has some pain in night  which is relieved by putting her feet down over the side of the bed. She is wearing Nitro-Dur patches on the top of her right foot. Past medical history includes type 2 diabetes with secondary PAD, neoplasm of the skin, diabetic retinopathy, carotid artery stenosis, hyperlipidemia hypertension. 11/2; the last time the patient was here I spoke to Dr. Gwenlyn Found about revascularization options on the right. As I understand things currently Dr. Gwenlyn Found spoke with Dr. Trula Slade and ultimately the patient was taken to surgery on 10/27. She had a right iliofemoral endarterectomy with a bovine patch angioplasty. I think the plan now is for her to have a staged intervention on the right SFA by Dr. Alvester Chou. Per the patient's understanding Dr. Gwenlyn Found and Dr. Jacqualyn Posey are waiting to see when the gangrenous toes on the right foot can be amputated. The patient states her pain is a lot better and she is grateful for that. 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 Small area on the left fourth toe. She is using Betadine to the mummified areas on the right and Medihoney on the left 08/27/2019; the last time I saw this patient I discharged her from the clinic. She had been revascularized by Dr. Trula Slade and she had a right iliofemoral endarterectomy with a bovine angioplasty. 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 Jacqualyn Posey of podiatry on 08/07/2019. I note that she also had a intervention by Dr. Gwenlyn Found and he performed a directional atherectomy and drug-coated balloon angioplasty of the SFA and popliteal artery on the right. I am not certain of the date of Dr. Kennon Holter procedure as of the time of this dictation. She was referred back to [place] by Dr. Earleen Newport predominantly for follow-up of the left fourth toe. She still has sutures and stitches in the right TMA site. She states her pain is a lot better. She expresses concern about the condition of the  amputation site at the TMA. She is on doxycycline I think prescribed by Dr. Earleen Newport. She is complaining of some pain at night 12/10; I spoke to Dr. Jacqualyn Posey  last week. He removed the sutures on the right foot on Monday of this week. She has the area on the left fourth toe just proximal to the PIP and then the right TMA site. She is still on doxycycline and has enough through next week. Unfortunately the TMA site does not look good at all. Both on the lateral and medial part of the incisions are areas that probe to bone. There is purulence over the medial part which I have cultured. We will use silver alginate. Left fourth toe looks somewhat better but there was still exposed bone 12/17; patient has an MRI booked for 12/30. Culture I did last week showed Pseudomonas Serratia and Enterococcus. This was purulent drainage coming out of the medial part of her amputation site. I use cefdinir 300 twice daily for 10 days that started on 12/15. Her x-ray on the right showed limited evaluation for osteomyelitis. The findings could have been postoperative. There was subtle erosion in the distal first and distal fifth metatarsal. An MRI was suggested. On the left she had irregularity of the fourth middle and proximal phalanx consistent with a history of osteomyelitis. I do not know that she has a history of osteomyelitis in this area. She had a newly defined area on the plantar third toe 12/31; patient's MRI is listed below: MRI OF THE RIGHT FOREFOOT WITHOUT AND WITH CONTRAST TECHNIQUE: Multiplanar, multisequence MR imaging of the right forefoot was performed before and after the administration of intravenous contrast. CONTRAST: 6 mL GADAVIST IV SOLN COMPARISON: Plain films right foot 09/04/2019. FINDINGS: Bones/Joint/Cartilage The patient is status post transmetatarsal amputation as seen on the prior exam. Marrow edema and enhancement are seen in all of the distal metatarsals. In the first metatarsal,  signal change extends 1.5 cm proximal to the stump and in the second metatarsal extends approximately 2 cm proximal to the stump. Edema and enhancement are seen in only the distal 0.7 cm of the third metatarsal stump and tips of the fourth and fifth metatarsals. Bone marrow signal is otherwise unremarkable. A small focus of subchondral edema is seen in the lateral talus, likely degenerative. Ligaments Intact. Muscles and Tendons No intramuscular fluid collection. Soft tissues Skin ulceration is seen off the stump of the first metatarsal. A thin fluid collection tracks deep to the wound and over the anterior metatarsals worrisome for small abscess. Intense subcutaneous edema and enhancement are seen diffusely. 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 Cellulitis about the foot. Skin ulceration over the distal first metatarsal is identified with a thin fluid collection tracking anteriorly along the stump worrisome for abscess. Small focus of subchondral edema in the lateral talus is likely degenerative. Electronically Signed By: Inge Rise M.D. On: 09/23/2019 15:25 Patient arrives in clinic today not complaining any of any pain. 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 She does not describe claudication however her activity is very limited. 10/01/2019; since the patient was last here I have communicated with Dr. Gwenlyn Found after bypass by Dr. Trula Slade and addressing the right superficial femoral artery he states that she is widely patent through peroneal arteries to the ankle with collaterals to the dorsalis pedis. He states he is going to talk to colleagues about the feasibility of tibial pedal access. The patient seems infectious disease later this afternoon Dr.  Baxter Flattery. in preparation for  this she has been off antibiotics for 1 week and I went ahead and obtain pieces of the remanence of her first metatarsal for pathology and CandS. The patient is a candidate for hyperbaric oxygen. 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 1/15; considerable improvement in the wounds on her feet. We are using silver collagen. She follows with Dr. Baxter Flattery of infectious disease and is on meropenem and daptomycin. She has been taught how to do this herself at home. Follows with Dr. Graylon Good at the end of treatment here. She has 2 wounds on the surgical TMA site 1 lateral and 1 medial the lateral 1 has regressed tremendously. The area medially still has some exposed bone although the base of this looks healthy. 1/22; 2 separate wounds on the surgical TMA site. Both of these look satisfactory. The medial area does not have any exposed bone. This is an improvement On the left side fourth toe dorsally over the proximal phalanx there is a deep punched out area that probes to bone. She has an area on the tip of the left third toe. She also tells [place] that in HBO she traumatized her left Achilles and this is left her with a superficial wound area 1/28; weekly visit along with HBO. She has 5 wounds. To our punched out areas on the original TMA site on the right. Both of these appear to have contracted. The area on the right no longer has exposed bone. She has an area on the tip of the left third toe and on the DIP of the left fourth toe. Both of these had surface callus that I removed and unfortunately they have small areas that both go to bone. She has a traumatic wound on the left Achilles area that happened in HBO and that appears better. She is tolerating her IV antibiotics well at home. She has home health changing the dressings and she is doing it once on the weekends. We have been using silver collagen. She has been extensively  revascularized on the right by Dr. Trula Slade and subsequently by Dr. Gwenlyn Found. 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 Baxter Flattery of infectious disease on 2/17. Dr. Gwenlyn Found on 2/23 and according to her her antibiotics stopped on 2/23. She is tolerating hyperbarics well. She has made a tremendous improvement in the right forefoot with only 2 smaller open wounds. Using silver collagen. On the left foot she has the area on the tip of the left third toe and the medial part of the left fourth toe. These had exposed bone last week I did not sense any of that today 2/12; sees Dr. Graylon Good on 2/17 and Dr. Gwenlyn Found on 2/23. We are going to use Dermagraft on this today however the lateral part of her train TMA incision on the right is healed and the medial part is down so much that we just continue with the silver collagen She has wounds on the tip of her left third and the medial aspect of the left fourth. Both of these still have exposed bone I have not been able to get these to epithelialize. 2/19; she sees Dr. Gwenlyn Found on 2/23 and I have communicated with him about the left vascular supply. Looking at her angiogram from 08/03/2019 it looks as though that they could not cross the left CFA.  Noted that she had one-vessel runoff bilaterally. She also apparently saw Dr. Graylon Good although I did not look up these results for preparation of this record. 2/26; Dr. Gwenlyn Found is going to do an angiogram next week on Wednesday. I think they are going to try to go at this both retrograde and anterograde to see if anything can be done to revascularize the left lower limb. She continues to make nice progress on the remaining wound on the right medial foot on her TMA site and the toe it wounds on the left are responding nicely as well 3/12; the patient underwent an extensive and complicated revascularization  on the left leg by Dr. Fletcher Anon on 3/3; she had an atherectomy on occluded left popliteal artery; anterior tibial artery followed by a balloon angioplasty. Atherectomy was also performed to the left SFA because there was still significant 50% stenosis in the left popliteal artery they performed an intravascular lithotripsy which improved the residual stenosis to 20%. The same lithotripsy was used to dilate the proximal left SFA. She had a drug-eluting stent placed I believe in the SFA. The patient returned for hyperbarics this week. She has had some eye problems on the right which she tells me are secondary to diabetic retinopathy and she saw her eye doctor. She is really made excellent progress there is no open wounds on the left foot at all. She has one open area medially and her TMA site on the right lateral wound is closed. 3/19; the patient comes in today with the area on the medial transmet on the right improved. Some debris removed from the surface revealed the still open wound. Unfortunately she now has the open area on the third toe tip and the medial aspect of the dorsal fourth toe. These are in the same location as her previous wounds. These had actually closed up last week Electronic Signature(s) Signed: 12/11/2019 5:09:02 PM By: [name] MD Entered By: [name] on 12/11/2019 17:04:08 -------------------------------------------------------------------------------- Physical Exam Details Patient Name: Date of Service: [name] 12/11/2019 4:00 PM Medical Record Number:[id number] Patient Account Number: [id number] Date of Birth/Sex: Treating RN: 1941-06-30 (79 y.o. [name] Primary Care Provider: [name] Other Clinician: Referring Provider: Treating Provider/[name]:[name], [name], [name] Weeks in Treatment: 21 Constitutional Patient is hypertensive.. Pulse regular and within target range for patient.Marland Kitchen Respirations regular,  non-labored and within target range.. Temperature is normal and within the target range for the patient.Marland Kitchen Appears in no distress. Notes Wound exam; TMA had eschar and dry skin on the medial aspect which I removed with a #3 curette to reveal a still open wound. There is nothing laterally no evidence of infection The left foot plantar third toe and the medial fourth toe did not show any open areas last week but this week they are both open. The left third toe was debrided with a #3 curette of surface eschar and skin there is no palpable or probable bone. Electronic Signature(s) Signed: 12/11/2019 5:09:02 PM By: [name] MD Entered By: [name] on 12/11/2019 17:05:11 -------------------------------------------------------------------------------- Physician Orders Details Patient Name: Date of Service: [name] 12/11/2019 4:00 PM Medical Record Number:[id number] Patient Account Number: [id number] Date of Birth/Sex: Treating RN: 10/23/1940 (79 y.o. 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 - after HBO Dressing Change Frequency Change Dressing every other day. - home health to  change twice a week. wound center weekly. Wound Cleansing Clean wound with Wound Cleanser Primary Wound Dressing Wound #7 Right Amputation Site - Transmetatarsal Silver Collagen - moisten with hydrogel. Wound #1R Left,Dorsal Toe Fourth Silver Collagen - moisten with hydrogel Wound #8R Left Toe Third Silver Collagen - moisten with hydrogel Secondary Dressing Wound #7 Right Amputation Site - Transmetatarsal Kerlix/Rolled Gauze ABD pad Wound #1R Left,Dorsal Toe Fourth Dry Gauze - secure with tape and netting, or use conform/tape Wound #8R Left Toe Third Dry Gauze - secure with tape and netting, or use conform/tape Edema Control Avoid standing for long periods of time Elevate legs to the level of the heart or above for 30 minutes daily and/or when sitting, a frequency of: - throughout the day. Off-Loading DH Walker Boot to: - right foot Open toe surgical shoe to: - felt in surgical shoe for 3rd toe. wound center to provide patient with shoe today. [facility] for wound care. - [facility] twice a week. Electronic Signature(s) Signed: 12/11/2019 5:09:02 PM By: [name] MD Signed: 12/11/2019 5:10:17 PM By: Kela Millin Entered By: Kela Millin on 12/11/2019 16:57:23 -------------------------------------------------------------------------------- Problem List Details Patient Name: Date of Service: [name] 12/11/2019 4:00 PM Medical Record Number:[id number] Patient Account Number: [id number] Date of Birth/Sex: Treating RN: 1940/11/29 (79 y.o. [name] Primary Care Provider: [name] Other Clinician: Referring Provider: Treating Provider/[name]:[name], [name], [name] Weeks in Treatment: 21 Active Problems ICD-10 Evaluated Encounter Code Description Active Date Today Diagnosis E11.621 Type 2 diabetes mellitus with foot ulcer 07/13/2019 No Yes E11.52 Type 2 diabetes mellitus  with diabetic peripheral 07/13/2019 No Yes angiopathy with gangrene L97.518 Non-pressure chronic ulcer of other part of right foot 07/13/2019 No Yes with other specified severity L97.528 Non-pressure chronic ulcer of other part of left foot 07/13/2019 No Yes with other specified severity E11.42 Type 2 diabetes mellitus with diabetic polyneuropathy 07/13/2019 No Yes 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 [name] Primary Care Provider: [name] Other Clinician: Referring Provider: Treating Provider/[name]:[name], [name], [name] Weeks in Treatment: 21 Subjective History of Present Illness (HPI) ADMISSION 07/13/2019 Patient is a 79 year old type II diabetic. She has known PAD. She has been followed by Dr. Jacqualyn Posey of podiatry for blistering areas on her toes dating back to 04/30/2019 which at this was the left fourth toe. By 8/11 she had wounds on the right and left second toes. She underwent arterial studies by Dr. Alvester Chou on 7/31 that showed ABIs on the right of 0.60 TBI of 0.26 on the left ABI of  0.56 and a TBI of 0.25. By 9/10 she had ischemic-looking wounds per Dr. Earleen Newport on the right first, left first second and third. She has been using Medihoney and then mupirocin more recently simply Betadine. The patient underwent angiography by Dr. Gwenlyn Found on 9/21. On the  right this showed 90 to 95% calcified distal right common femoral artery stenosis and a 95% focal mid SFA stenosis followed by an 80% segmental stenosis. Noted that there was 1 vessel runoff in the foot via the peroneal. ooOn the left there was an 80% left SFA, 70% mid left SFA. There was a short segment calcified CTO distal less than SFA and above-knee popliteal artery reconstituting with two-vessel runoff. 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 It was felt that she had bilateral SFA disease as well as tibial vessel disease. An attempt was made to revascularize the left SFA but they were unable to cross because of the highly calcified nature of the lesion. 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 She has an area on the medial left fourth toe with raised horned callus on top of this. I am not certain what this represents. With regards to pain she has about a 2-1/2 I will claudication tolerance in the grocery store. She has some pain in night which is relieved by putting her feet down over the side of the bed. She is wearing Nitro-Dur patches on the top of her right foot. Past medical history includes type 2 diabetes with secondary PAD, neoplasm of the skin, diabetic retinopathy, carotid artery stenosis, hyperlipidemia hypertension. 11/2; the last time the patient was here I spoke to Dr. Gwenlyn Found about revascularization options on the right. As I understand things currently Dr. Gwenlyn Found spoke with Dr. Trula Slade and ultimately the patient was taken to surgery on 10/27. She had a right iliofemoral endarterectomy with a bovine patch angioplasty. I think the plan now is for her to have a staged intervention on the right SFA by Dr. Alvester Chou. Per the patient's understanding Dr. Gwenlyn Found and Dr. Jacqualyn Posey are waiting to see when the gangrenous toes on the right foot can be amputated. The  patient states her pain is a lot better and she is grateful for that. 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 Small area on the left fourth toe. She is using Betadine to the mummified areas on the right and Medihoney on the left 08/27/2019; the last time I saw this patient I discharged her from the clinic. She had been revascularized by Dr. Trula Slade and she had a right iliofemoral endarterectomy with a bovine angioplasty. 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 Jacqualyn Posey of podiatry on 08/07/2019. I note that she also had a intervention by Dr. Gwenlyn Found and he performed a directional atherectomy and drug-coated balloon angioplasty of the SFA and popliteal artery on the right. I am not certain of the date of Dr. Kennon Holter procedure as of the time of this dictation. She was referred back to [place] by Dr. Earleen Newport predominantly for follow-up of the left fourth toe. She still has sutures and stitches in the right TMA site. She states her pain is a lot better. She expresses concern about the condition of the amputation site at the TMA. She is on doxycycline I think prescribed by Dr. Earleen Newport. She is complaining of  some pain at night 12/10; I spoke to Dr. Jacqualyn Posey last week. He removed the sutures on the right foot on Monday of this week. She has the area on the left fourth toe just proximal to the PIP and then the right TMA site. She is still on doxycycline and has enough through next week. Unfortunately the TMA site does not look good at all. Both on the lateral and medial part of the incisions are areas that probe to bone. There is purulence over the medial part which I have cultured. We will use silver alginate. Left fourth toe looks somewhat better but there was still exposed bone 12/17; patient has an MRI booked for 12/30. Culture I did last week showed Pseudomonas Serratia and Enterococcus. This was purulent drainage coming out  of the medial part of her amputation site. I use cefdinir 300 twice daily for 10 days that started on 12/15. Her x-ray on the right showed limited evaluation for osteomyelitis. The findings could have been postoperative. There was subtle erosion in the distal first and distal fifth metatarsal. An MRI was suggested. On the left she had irregularity of the fourth middle and proximal phalanx consistent with a history of osteomyelitis. I do not know that she has a history of osteomyelitis in this area. She had a newly defined area on the plantar third toe 12/31; patient's MRI is listed below: MRI OF THE RIGHT FOREFOOT WITHOUT AND WITH CONTRAST TECHNIQUE: Multiplanar, multisequence MR imaging of the right forefoot was performed before and after the administration of intravenous contrast. CONTRAST: 6 mL GADAVIST IV SOLN COMPARISON: Plain films right foot 09/04/2019. FINDINGS: Bones/Joint/Cartilage The patient is status post transmetatarsal amputation as seen on the prior exam. Marrow edema and enhancement are seen in all of the distal metatarsals. In the first metatarsal, signal change extends 1.5 cm proximal to the stump and in the second metatarsal extends approximately 2 cm proximal to the stump. Edema and enhancement are seen in only the distal 0.7 cm of the third metatarsal stump and tips of the fourth and fifth metatarsals. Bone marrow signal is otherwise unremarkable. A small focus of subchondral edema is seen in the lateral talus, likely degenerative. Ligaments Intact. Muscles and Tendons No intramuscular fluid collection. Soft tissues Skin ulceration is seen off the stump of the first metatarsal. A thin fluid collection tracks deep to the wound and over the anterior metatarsals worrisome for small abscess. Intense subcutaneous edema and enhancement are seen diffusely. IMPRESSION: Status post transmetatarsal amputation. Findings consistent with osteomyelitis are seen in the  distal metatarsals, most extensive in the first and second as described above. Cellulitis about the foot. Skin ulceration over the distal first metatarsal is identified with a thin fluid collection tracking anteriorly along the stump worrisome for abscess. Small focus of subchondral edema in the lateral talus is likely degenerative. Electronically Signed By: Inge Rise M.D. On: 09/23/2019 15:25 Patient arrives in clinic today not complaining any of any pain. 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 She does not describe claudication however her activity is very limited. 10/01/2019; since the patient was last here I have communicated with Dr. Gwenlyn Found after bypass by Dr. Trula Slade and addressing the right superficial femoral artery he states that she is widely patent through peroneal arteries to the ankle with collaterals to the dorsalis pedis. He states he is going to talk to colleagues about the feasibility of tibial pedal access. The patient seems  infectious disease later this afternoon Dr. Baxter Flattery. in preparation for this she has been off antibiotics for 1 week and I went ahead and obtain pieces of the remanence of her first metatarsal for pathology and CandS. The patient is a candidate for hyperbaric oxygen. 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 1/15; considerable improvement in the wounds on her feet. We are using silver collagen. She follows with Dr. Baxter Flattery of infectious disease and is on meropenem and daptomycin. She has been taught how to do this herself at home. Follows with Dr. Graylon Good at the end of treatment here. She has 2 wounds on the surgical TMA site 1 lateral and 1 medial the lateral 1 has regressed tremendously. The area medially still has some exposed bone although the base of this looks healthy. 1/22; 2 separate wounds on the surgical TMA site.  Both of these look satisfactory. The medial area does not have any exposed bone. This is an improvement On the left side fourth toe dorsally over the proximal phalanx there is a deep punched out area that probes to bone. She has an area on the tip of the left third toe. She also tells [place] that in HBO she traumatized her left Achilles and this is left her with a superficial wound area 1/28; weekly visit along with HBO. She has 5 wounds. To our punched out areas on the original TMA site on the right. Both of these appear to have contracted. The area on the right no longer has exposed bone. She has an area on the tip of the left third toe and on the DIP of the left fourth toe. Both of these had surface callus that I removed and unfortunately they have small areas that both go to bone. She has a traumatic wound on the left Achilles area that happened in HBO and that appears better. She is tolerating her IV antibiotics well at home. She has home health changing the dressings and she is doing it once on the weekends. We have been using silver collagen. She has been extensively revascularized on the right by Dr. Trula Slade and subsequently by Dr. Gwenlyn Found. 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 Baxter Flattery of infectious disease on 2/17. Dr. Gwenlyn Found on 2/23 and according to her her antibiotics stopped on 2/23. She is tolerating hyperbarics well. She has made a tremendous improvement in the right forefoot with only 2 smaller open wounds. Using silver collagen. On the left foot she has the area on the tip of the left third toe and the medial part of the left fourth toe. These had exposed bone last week I did not sense any of that today 2/12; sees Dr. Graylon Good on 2/17 and Dr. Gwenlyn Found on 2/23. We are going to use Dermagraft on this today however the lateral part of her train TMA incision on  the right is healed and the medial part is down so much that we just continue with the silver collagen She has wounds on the tip of her left third and the medial aspect of the left fourth. Both of these still have exposed bone I have not been able to get these to epithelialize. 2/19; she sees Dr. Gwenlyn Found on 2/23 and I have communicated with him about the left vascular supply. Looking at her angiogram from 08/03/2019 it  looks as though that they could not cross the left CFA. Noted that she had one-vessel runoff bilaterally. She also apparently saw Dr. Graylon Good although I did not look up these results for preparation of this record. 2/26; Dr. Gwenlyn Found is going to do an angiogram next week on Wednesday. I think they are going to try to go at this both retrograde and anterograde to see if anything can be done to revascularize the left lower limb. She continues to make nice progress on the remaining wound on the right medial foot on her TMA site and the toe it wounds on the left are responding nicely as well 3/12; the patient underwent an extensive and complicated revascularization on the left leg by Dr. Fletcher Anon on 3/3; she had an atherectomy on occluded left popliteal artery; anterior tibial artery followed by a balloon angioplasty. Atherectomy was also performed to the left SFA because there was still significant 50% stenosis in the left popliteal artery they performed an intravascular lithotripsy which improved the residual stenosis to 20%. The same lithotripsy was used to dilate the proximal left SFA. She had a drug-eluting stent placed I believe in the SFA. The patient returned for hyperbarics this week. She has had some eye problems on the right which she tells me are secondary to diabetic retinopathy and she saw her eye doctor. She is really made excellent progress there is no open wounds on the left foot at all. She has one open area medially and her TMA site on the right lateral wound is closed. 3/19;  the patient comes in today with the area on the medial transmet on the right improved. Some debris removed from the surface revealed the still open wound. ooUnfortunately she now has the open area on the third toe tip and the medial aspect of the dorsal fourth toe. These are in the same location as her previous wounds. These had actually closed up last week Objective Constitutional Patient is hypertensive.. Pulse regular and within target range for patient.Marland Kitchen Respirations regular, non-labored and within target range.. Temperature is normal and within the target range for the patient.Marland Kitchen Appears in no distress. Vitals Time Taken: 4:39 PM, Height: 64 in, Weight: 142 lbs, BMI: 24.4, Temperature: 98.6 F, Pulse: 63 bpm, Respiratory Rate: 18 breaths/min, Blood Pressure: 162/40 mmHg. General Notes: Wound exam; TMA had eschar and dry skin on the medial aspect which I removed with a #3 curette to reveal a still open wound. There is nothing laterally no evidence of infection ooThe left foot plantar third toe and the medial fourth toe did not show any open areas last week but this week they are both open. The left third toe was debrided with a #3 curette of surface eschar and skin there is no palpable or probable bone. Integumentary (Hair, Skin) Wound #1R status is Open. Original cause of wound was Blister. The wound is located on the Left,Dorsal Toe Fourth. The wound measures 0.2cm length x 0.3cm width x 0.2cm depth; 0.047cm^2 area and 0.009cm^3 volume. There is Fat Layer (Subcutaneous Tissue) Exposed exposed. There is no tunneling or undermining noted. There is a small amount of serosanguineous drainage noted. The wound margin is distinct with the outline attached to the wound base. There is large (67-100%) pink granulation within the wound bed. There is no necrotic tissue within the wound bed. Wound #7 status is Open. Original cause of wound was Surgical Injury. The wound is located on the  Right Amputation Site - Transmetatarsal. The wound measures 0.2cm length  x 0.7cm width x 0.1cm depth; 0.11cm^2 area and 0.011cm^3 volume. There is Fat Layer (Subcutaneous Tissue) Exposed exposed. There is no tunneling or undermining noted. There is a medium amount of serosanguineous drainage noted. The wound margin is well defined and not attached to the wound base. There is medium (34-66%) pink granulation within the wound bed. There is a medium (34-66%) amount of necrotic tissue within the wound bed including Adherent Slough. Wound #8R status is Open. Original cause of wound was Gradually Appeared. The wound is located on the Left Toe Third. The wound measures 0.1cm length x 0.2cm width x 0.1cm depth; 0.016cm^2 area and 0.002cm^3 volume. There is Fat Layer (Subcutaneous Tissue) Exposed exposed. There is no tunneling or undermining noted. There is a none present amount of drainage noted. The wound margin is distinct with the outline attached to the wound base. There is large (67-100%) pink granulation within the wound bed. There is no necrotic tissue within the wound bed. 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 There was a Selective/Open Wound Skin/Dermis Debridement with a total area of 0.14 sq cm performed by  Ricard Dillon., MD. With the following instrument(s): Curette to remove Viable and Non-Viable tissue/material. Material removed includes Skin: Dermis after achieving pain control using Other (benzocaine, 20%). No specimens were taken. A time out was conducted at 16:55, prior to the start of the procedure. A Minimum amount of bleeding was controlled with Pressure. The procedure was tolerated well with a pain level of 0 throughout and a pain level of 0 following the procedure. Post Debridement Measurements: 0.2cm length x 0.7cm width x 0.1cm depth; 0.011cm^3 volume. Character of Wound/Ulcer Post Debridement is improved. Severity of Tissue Post Debridement is: Fat layer exposed. Post procedure Diagnosis Wound #7: Same as Pre-Procedure Wound #8R Pre-procedure diagnosis of Wound #8R is a Diabetic Wound/Ulcer of the Lower Extremity located on the Left Toe Third .Severity of Tissue Pre Debridement is: Fat layer exposed. There was a Selective/Open Wound Skin/Dermis Debridement with a total area of 0.02 sq cm performed by Ricard Dillon., MD. With the following instrument(s): Curette to remove Viable and Non-Viable tissue/material. Material removed includes Skin: Dermis after achieving pain control using Other (benzocaine, 20%). No specimens were taken. A time out was conducted at 16:55, prior to the start of the procedure. A Minimum amount of bleeding was controlled with Pressure. The procedure was tolerated well with a pain level of 0 throughout and a pain level of 0 following the procedure. Post Debridement Measurements: 0.1cm length x 0.2cm width x 0.1cm depth; 0.002cm^3 volume. Character of Wound/Ulcer Post Debridement is improved. Severity of Tissue Post Debridement is: Fat layer exposed. Post procedure Diagnosis Wound #8R: Same as Pre-Procedure Plan Follow-up Appointments: Return Appointment in 1 week. - after HBO Dressing Change Frequency: Change Dressing every other day. - home health  to change twice a week. wound center weekly. Wound Cleansing: Clean wound  with Wound Cleanser Primary Wound Dressing: Wound #7 Right Amputation Site - Transmetatarsal: Silver Collagen - moisten with hydrogel. Wound #1R Left,Dorsal Toe Fourth: Silver Collagen - moisten with hydrogel Wound #8R Left Toe Third: Silver Collagen - moisten with hydrogel Secondary Dressing: Wound #7 Right Amputation Site - Transmetatarsal: Kerlix/Rolled Gauze ABD pad Wound #1R Left,Dorsal Toe Fourth: Dry Gauze - secure with tape and netting, or use conform/tape Wound #8R Left Toe Third: Dry Gauze - secure with tape and netting, or use conform/tape Edema Control: Avoid standing for long periods of time Elevate legs to the level of the heart or above for 30 minutes daily and/or when sitting, a frequency of: - throughout the day. Off-Loading: DH Walker Boot to: - right foot Open toe surgical shoe to: - felt in surgical shoe for 3rd toe. wound center to provide patient with shoe today. Home Health: [facility]. - [facility] twice a week. 1. Silver collagen to all wound areas 2 I will need to review what we know about the infection status in the left foot. She was followed by Dr. Graylon Good had IV antibiotics for osteomyelitis in the right foot. Had like to review her notes as well as imaging studies. At 1 point both the left third and fourth toes had exposed bone which is worrisome for the thought of ongoing infection in these areas. She is not currently on any antibiotics 3. She has done really well with hyperbaric oxygen. The area on the right TMA which is the primary reason for this has just about closed Electronic Signature(s) Signed: 12/11/2019 5:09:02 PM By: [name] MD Entered By: [name] on 12/11/2019 17:06:35 -------------------------------------------------------------------------------- SuperBill Details Patient Name: Date of  Service: [name] 12/11/2019 Medical Record Number:[id number] Patient Account Number: [id number] Date of Birth/Sex: Treating RN: 1941/09/16 (79 y.o. [name] Primary Care Provider: [name] Other Clinician: Referring Provider: Treating Provider/[name]:[name], [name], [name] Weeks in Treatment: 21 Diagnosis Coding ICD-10 Codes Code Description E11.621 Type 2 diabetes mellitus with foot ulcer E11.52 Type 2 diabetes mellitus with diabetic peripheral angiopathy with gangrene L97.518 Non-pressure chronic ulcer of other part of right foot with other specified severity L97.528 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ity E11.42 Type 2 diabetes mellitus with diabetic polyneuropathy 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, 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 Facility Procedures The patient participates with Medicare or their insurance follows the Medicare Facility Guidelines: CPT4 Code Description Modifier Quantity [id number] - DEBRIDE WOUND 1ST 20 SQ CM OR < 1 ICD-10 Diagnosis Description L97.528 Non-pressure chronic ulcer  of other part of left foot with other specified severity L97.518 Non-pressure chronic ulcer of other part of right foot with other specified severity Physician Procedures CPT4 Code Description: [id number] - WC PHYS DEBR WO ANESTH 20 SQ CM ICD-10 Diagnosis Description L97.528 Non-pressure chronic ulcer of other part of left foot with oth L97.518 Non-pressure chronic ulcer of other part of right foot with ot Modifier: er specified her specifie Quantity: 1 severity d severity Electronic Signature(s) Signed: 12/11/2019 5:09:02 PM By: [name] MD Entered By: [name] on 12/11/2019 17:06:51

## 2019-12-11 NOTE — Progress Notes (Signed)
[name], [name] ([id number]) Visit Report for 12/11/2019 SuperBill Details Patient Name: Date of Service: [name], [name] 12/11/2019 Medical Record Number:[id number] Patient Account Number: [id number] Date of Birth/Sex: Treating RN: March 12, 1941 (79 y.o. [name] Primary Care Provider: [name] Other Clinician: [name] Referring Provider: Treating Provider/[name]:Ector Laurel, [name], [name] Weeks in Treatment: 21 Diagnosis Coding ICD-10 Codes Code Description E11.621 Type 2 diabetes mellitus with foot ulcer E11.52 Type 2 diabetes mellitus with diabetic peripheral angiopathy with gangrene L97.518 Non-pressure chronic ulcer of other part of right foot with other specified severity L97.528 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ity E11.42 Type 2 diabetes mellitus with diabetic polyneuropathy 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, 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 Facility Procedures The patient participates with Medicare or their insurance follows the Medicare Facility Guidelines CPT4 Code Description Modifier Quantity [id number] G0277-(Facility Use Only) HBOT, full body chamber, 66min 4 Physician Procedures CPT4 Code Description Modifier Quantity [id number] - WC PHYS HYPERBARIC OXYGEN THERAPY 1 ICD-10 Diagnosis Description E11.621 Type 2 diabetes mellitus with foot ulcer Electronic Signature(s) Signed: 12/11/2019 4:38:27 PM By: [name] EMT/HBOT Signed: 12/11/2019 5:09:02 PM By: [name] MD Entered By: [name] on 12/11/2019 16:37:27

## 2019-12-11 NOTE — Progress Notes (Signed)
[name] ([id number]) Visit Report for 12/11/2019 Arrival Information Details Patient Name: Date of Service: [name], [name] 12/11/2019 2:00 PM Medical Record Number:[id number] Patient Account Number: [id number] Date of Birth/Sex: Treating RN: Jan 08, 1941 (79 y.o. [name] Primary Care Janiyla Long: [name] Other Clinician: [name] Referring Zulay Corrie: Treating Karie Skowron/[name]:[name], [name], [name] Weeks in Treatment: 21 Visit Information History Since Last Visit Added or deleted any medications: No Patient Arrived: [name] Any new allergies or adverse reactions: No Arrival Time: 13:50 Had a fall or experienced change in No Accompanied By: self activities of daily living that may affect Transfer Assistance: None risk of falls: Patient Identification Verified: Yes Signs or symptoms of abuse/neglect since last No Secondary Verification Process Yes visito Completed: Hospitalized since last visit: No Patient Requires Transmission- No Implantable device outside of the clinic excluding No Based Precautions: cellular tissue based products placed in the center Patient Has Alerts: Yes since last visit: Patient Alerts: Right ABI .62 TBI Pain Present Now: No refused Left ABI .60 TBI .14 Electronic Signature(s) Signed: 12/11/2019 4:38:27 PM By: [name] EMT/HBOT Entered By: [name] on 12/11/2019 14:31:01 -------------------------------------------------------------------------------- Encounter Discharge Information Details Patient Name: Date of Service: [name] 12/11/2019 2:00 PM Medical Record Number:[id number] Patient Account Number: [id number] Date of Birth/Sex: Treating RN: 02/26/1941 (79 y.o. [name] Primary Care Jeanae Whitmill: [name] Other Clinician: [name] Referring Tristyn Pharris: Treating Platon Arocho/[name]:[name], [name], [name] in Treatment: 21 Encounter Discharge  Information Items Discharge Condition: Stable Ambulatory Status: Cane Discharge Destination: Home Transportation: Private Auto Accompanied By: self Schedule Follow-up Appointment: Yes Clinical Summary of Care: Patient Declined Electronic Signature(s) Signed: 12/11/2019 4:38:27 PM By: [name] EMT/HBOT Entered By: [name] on 12/11/2019 16:37:49 -------------------------------------------------------------------------------- Patient/Caregiver Education Details Patient Name: Date of Service: [name], [name] 3/19/2021andnbsp2:00 PM Medical Record Patient Account Number: [id number] Number: Treating RN: Kela Millin Date of Birth/Gender: 06-18-1941 (79 y.o. F) Other Clinician: [name] Primary Care Physician: Verna Czech Referring Physician: Physician/[name]: Darl Pikes in Treatment: 21 Education Assessment Education Provided To: Patient Education Topics Provided Hyperbaric Oxygenation: Methods: Explain/Verbal Responses: State content correctly Electronic Signature(s) Signed: 12/11/2019 4:38:27 PM By: [name] EMT/HBOT Entered By: [name] on 12/11/2019 16:37:39 -------------------------------------------------------------------------------- Vitals Details Patient Name: Date of Service: [name] 12/11/2019 2:00 PM Medical Record Number:[id number] Patient Account Number: [id number] Date of Birth/Sex: Treating RN: 23-May-1941 (79 y.o. [name] Primary Care Cherri Yera: [name] Other Clinician: [name] Referring Rogerick Baldwin: Treating Geraldin Habermehl/[name]:[name], [name], [name] Weeks in Treatment: 21 Vital Signs Time Taken: 13:55 Temperature (F): 98.6 Height (in): 64 Pulse (bpm): 63 Weight (lbs): 142 Respiratory Rate (breaths/min): 17 Body Mass Index (BMI): 24.4 Blood Pressure (mmHg): 162/40 Capillary Blood Glucose (mg/dl): 125 Reference  Range: 80 - 120 mg / dl Electronic Signature(s) Signed: 12/11/2019 4:38:27 PM By: [name] EMT/HBOT Entered By: [name] on 12/11/2019 14:31:15

## 2019-12-14 ENCOUNTER — Other Ambulatory Visit: Payer: Self-pay

## 2019-12-14 ENCOUNTER — Encounter (HOSPITAL_BASED_OUTPATIENT_CLINIC_OR_DEPARTMENT_OTHER): Payer: Medicare Other | Admitting: Internal Medicine

## 2019-12-14 DIAGNOSIS — L97514 Non-pressure chronic ulcer of other part of right foot with necrosis of bone: Secondary | ICD-10-CM | POA: Diagnosis not present

## 2019-12-14 DIAGNOSIS — Z4781 Encounter for orthopedic aftercare following surgical amputation: Secondary | ICD-10-CM | POA: Diagnosis not present

## 2019-12-14 DIAGNOSIS — E11621 Type 2 diabetes mellitus with foot ulcer: Secondary | ICD-10-CM | POA: Diagnosis not present

## 2019-12-14 DIAGNOSIS — E1142 Type 2 diabetes mellitus with diabetic polyneuropathy: Secondary | ICD-10-CM | POA: Diagnosis not present

## 2019-12-14 DIAGNOSIS — L97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 limited to breakdown of skin: Secondary | ICD-10-CM | POA: Diagnosis not present

## 2019-12-14 DIAGNOSIS — L97528 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ity: Secondary | ICD-10-CM | POA: Diagnosis not present

## 2019-12-14 DIAGNOSIS — D649 Anemia, unspecified: Secondary | ICD-10-CM | POA: Diagnosis not present

## 2019-12-14 DIAGNOSIS — M86371 Chronic multifocal osteomyelitis, right ankle and foot: Secondary | ICD-10-CM | POA: Diagnosis not present

## 2019-12-14 DIAGNOSIS — I10 Essential (primary) hypertension: Secondary | ICD-10-CM | POA: Diagnosis not present

## 2019-12-14 DIAGNOSIS — E1152 Type 2 diabetes mellitus with diabetic peripheral angiopathy with gangrene: Secondary | ICD-10-CM | POA: Diagnosis not present

## 2019-12-14 LAB — GLUCOSE, CAPILLARY
Glucose-Capillary: 129 mg/dL — ABNORMAL HIGH (ref 70–99)
Glucose-Capillary: 128 mg/dL — ABNORMAL HIGH (ref 70–99)

## 2019-12-14 NOTE — Progress Notes (Addendum)
NYHLA, [place] ([id number]) Visit Report for 12/14/2019 HBO Details Patient Name: Date of Service: [name], [name] 12/14/2019 2:00 PM Medical Record Number:[id number] Patient Account Number: [id number] Date of Birth/Sex: Treating RN: May 04, 1941 (79 y.o. [name] Primary Care [name]: [name] Other Clinician: Mikeal [name] Referring [name]: Treating [name]/[name]:[name], [name], [name] in Treatment: 22 HBO Treatment Course Details Treatment Course Number: 1 Ordering [name]: [name] Total Treatments Ordered: 40 HBO Treatment Start Date: 10/12/2019 HBO Indication: Diabetic Ulcer(s) of the Lower Extremity HBO Treatment Details Treatment Number: 38 Patient Type: Outpatient Chamber Type: Monoplace Chamber Serial #: [id number] Treatment Protocol: 2.5 ATA with 90 minutes oxygen, with two 5 minute air breaks Treatment Details Compression Rate Down: 2.0 psi / minute De-Compression Rate Up: 2.0 psi / minute Air breaks and CompressTx Pressure breathing periods DecompressDecompress Begins Reached (leave unused spaces Begins Ends blank) Chamber Pressure (ATA)1 2.5 2.5 2.5 2.5 2.5 --2.5 1 Clock Time (24 hr) 13:20 13:32 [id number]:3295:1884:1660:63--01:60 15:24 Treatment Length: 124 (minutes) Treatment Segments: 4 Vital Signs Capillary Blood Glucose Reference Range: 80 - 120 mg / dl HBO Diabetic Blood Glucose Intervention Range: <131 mg/dl or >249 mg/dl Time Vitals Blood Respiratory Capillary Blood Glucose Pulse Action Type: Pulse: Temperature: Taken: Pressure: Rate: Glucose (mg/dl): Meter #: Oximetry (%) Taken: Pre 13:05 137/59 61 17 98.1 129 Post 15:27 136/41 63 15 97.6 128 Treatment Response Treatment Toleration: Well Treatment Completion Treatment Completed without Adverse Event Status: Additional Procedure Documentation Tissue Sevierity: Necrosis of bone [name] Notes No concerns with treatment given Physician HBO  Attestation: I certify that I supervised this HBO treatment in accordance with Medicare guidelines. A trained Yes emergency response team is readily available per hospital policies and procedures. Continue HBOT as ordered. Yes Electronic Signature(s) Signed: 12/14/2019 5:34:39 PM By: [name] Ham MD Previous Signature: 12/14/2019 4:17:21 PM Version By: [name] EMT/HBOT Entered By: [name] on 12/14/2019 17:33:55 -------------------------------------------------------------------------------- HBO Safety Checklist Details Patient Name: Date of Service: [name] 12/14/2019 2:00 PM Medical Record Number:[id number] Patient Account Number: [id number] Date of Birth/Sex: Treating RN: 04-17-1941 (79 y.o. [name] Primary Care [name]: [name] Other Clinician: Mikeal [name] Referring Arryanna Holquin: Treating Gil Ingwersen/[name]:[name], [name], [name] in Treatment: 22 HBO Safety Checklist Items Safety Checklist Consent Form Signed Patient voided / foley secured and emptied When did you last eato 1200 - PB and banana sammy Last dose of injectable or oral agent metformin NA Ostomy pouch emptied and vented if applicable NA All implantable devices assessed, documented and approved NA Intravenous access site secured and place Valuables secured Linens and cotton and cotton/polyester blend (less than 51% polyester) Personal oil-based products / skin lotions / body lotions removed Wigs or hairpieces removed NA Smoking or tobacco materials removed Books / newspapers / magazines / loose paper removed Cologne, aftershave, perfume and deodorant removed Jewelry removed (may wrap wedding band) Make-up removed Hair care products removed NA Battery operated devices (external) removed NA Heating patches and chemical warmers removed NA Titanium eyewear removed Nail polish cured greater than 10 hours NA Casting material cured greater than 10  hours NA Hearing aids removed NA Loose dentures or partials removed NA Prosthetics have been removed Patient demonstrates correct use of air break device (if applicable) Patient concerns have been addressed Patient grounding bracelet on and cord attached to chamber Specifics for Inpatients (complete in addition to above) Medication sheet sent with patient Intravenous medications needed or due during therapy sent with patient Drainage tubes (e.g. nasogastric tube or chest tube  secured and vented) Endotracheal or Tracheotomy tube secured Cuff deflated of air and inflated with saline Airway suctioned Electronic Signature(s) Signed: 12/14/2019 1:33:06 PM By: [name] EMT/HBOT Entered By: [name] on 12/14/2019 13:33:06

## 2019-12-14 NOTE — Progress Notes (Signed)
[name], [name] ([id number]) Visit Report for 12/14/2019 Arrival Information Details Patient Name: Date of Service: [name], [name] 12/14/2019 2:00 PM Medical Record Number:[id number] Patient Account Number: [id number] Date of Birth/Sex: Treating RN: 1941/07/01 (79 y.o. [name] Primary Care Alcus Bradly: [name] Other Clinician: [name] Referring Vern Guerette: Treating Dwayne Bulkley/[name]:[name], [name], [name] Weeks in Treatment: 22 Visit Information History Since Last Visit Added or deleted any medications: No Patient Arrived: [name] Any new allergies or adverse reactions: No Arrival Time: 13:00 Had a fall or experienced change in No Accompanied By: self activities of daily living that may affect Transfer Assistance: None risk of falls: Patient Identification Verified: Yes Signs or symptoms of abuse/neglect since last No Secondary Verification Process Yes visito Completed: Hospitalized since last visit: No Patient Requires Transmission- No Implantable device outside of the clinic excluding No Based Precautions: cellular tissue based products placed in the center Patient Has Alerts: Yes since last visit: Patient Alerts: Right ABI .62 TBI Pain Present Now: No refused Left ABI .60 TBI .14 Electronic Signature(s) Signed: 12/14/2019 4:17:21 PM By: [name] EMT/HBOT Entered By: [name] on 12/14/2019 13:32:02 -------------------------------------------------------------------------------- Encounter Discharge Information Details Patient Name: Date of Service: [name] 12/14/2019 2:00 PM Medical Record Number:[id number] Patient Account Number: [id number] Date of Birth/Sex: Treating RN: [birth year] (79 y.o. [name] Primary Care Thurman Sarver: [name] Other Clinician: [name] Referring Allayah Raineri: Treating Lakesa Coste/[name]:[name], [name], [name] Weeks in Treatment: 22 Encounter Discharge  Information Items Discharge Condition: Stable Ambulatory Status: Cane Discharge Destination: Home Transportation: Private Auto Accompanied By: self Schedule Follow-up Appointment: Yes Clinical Summary of Care: Patient Declined Electronic Signature(s) Signed: 12/14/2019 4:17:21 PM By: [name] EMT/HBOT Entered By: [name] on 12/14/2019 16:17:00 -------------------------------------------------------------------------------- Patient/Caregiver Education Details Patient Name: Date of Service: [name], [name] H. 3/22/2021andnbsp2:00 PM Medical Record Patient Account Number: [id number] Number: Treating RN: [name] Date of Birth/Gender: [birth year] (78 y.o. F) Other Clinician: [name] Primary Care Physician: Verna Czech Referring Physician: Physician/[name]: Darl Pikes in Treatment: 22 Education Assessment Education Provided To: Patient Education Topics Provided Hyperbaric Oxygenation: Methods: Explain/Verbal Responses: State content correctly Electronic Signature(s) Signed: 12/14/2019 4:17:21 PM By: [name] EMT/HBOT Entered By: [name] on 12/14/2019 16:16:48 -------------------------------------------------------------------------------- Vitals Details Patient Name: Date of Service: [name] 12/14/2019 2:00 PM Medical Record Number:[id number] Patient Account Number: [id number] Date of Birth/Sex: Treating RN: September 24, 1941 (79 y.o. [name] Primary Care Takesha Steger: [name] Other Clinician: [name] Referring Jacqualine Weichel: Treating Marquetta Weiskopf/[name]:[name], [name], [name] Weeks in Treatment: 22 Vital Signs Time Taken: 13:05 Temperature (F): 98.1 Height (in): 64 Pulse (bpm): 61 Weight (lbs): 142 Respiratory Rate (breaths/min): 17 Body Mass Index (BMI): 24.4 Blood Pressure (mmHg): 137/59 Capillary Blood Glucose (mg/dl): 129 Reference  Range: 80 - 120 mg / dl Electronic Signature(s) Signed: 12/14/2019 4:17:21 PM By: [name] EMT/HBOT Entered By: [name] on 12/14/2019 13:32:18

## 2019-12-14 NOTE — Progress Notes (Signed)
LATRIA, MCCARRON ([id number]) Visit Report for 12/14/2019 SuperBill Details Patient Name: Date of Service: [name], [name] 12/14/2019 Medical Record Number:[id number] Patient Account Number: [id number] Date of Birth/Sex: Treating RN: 02-20-1941 (79 y.o. 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 other specified severity L97.528 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ity E11.42 Type 2 diabetes mellitus with diabetic polyneuropathy 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, 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 Facility Procedures The patient participates with Medicare or their insurance follows the Medicare Facility Guidelines CPT4 Code Description Modifier Quantity 94076808 G0277-(Facility Use Only) HBOT, full body chamber, 76min 4 Physician Procedures CPT4 Code Description Modifier Quantity 8110315 94585 - WC PHYS HYPERBARIC OXYGEN THERAPY 1 ICD-10 Diagnosis Description E11.621 Type 2 diabetes mellitus with foot ulcer Electronic Signature(s) Signed: 12/14/2019 4:17:21 PM By: [name] EMT/HBOT Signed: 12/14/2019 5:34:39 PM By: [name] MD Entered By: [name] on 12/14/2019 16:16:33

## 2019-12-15 ENCOUNTER — Encounter (HOSPITAL_BASED_OUTPATIENT_CLINIC_OR_DEPARTMENT_OTHER): Payer: Medicare Other | Admitting: Internal Medicine

## 2019-12-15 DIAGNOSIS — L97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 limited to breakdown of skin: Secondary | ICD-10-CM | POA: Diagnosis not present

## 2019-12-15 DIAGNOSIS — L97514 Non-pressure chronic ulcer of other part of right foot with necrosis of bone: Secondary | ICD-10-CM | POA: Diagnosis not present

## 2019-12-15 DIAGNOSIS — L97528 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ity: Secondary | ICD-10-CM | POA: Diagnosis not present

## 2019-12-15 DIAGNOSIS — M86371 Chronic multifocal osteomyelitis, right ankle and foot: Secondary | ICD-10-CM | POA: Diagnosis not present

## 2019-12-15 DIAGNOSIS — E11621 Type 2 diabetes mellitus with foot ulcer: Secondary | ICD-10-CM | POA: Diagnosis not present

## 2019-12-15 DIAGNOSIS — E1142 Type 2 diabetes mellitus with diabetic polyneuropathy: Secondary | ICD-10-CM | POA: Diagnosis not present

## 2019-12-15 LAB — GLUCOSE, CAPILLARY
Glucose-Capillary: 132 mg/dL — ABNORMAL HIGH (ref 70–99)
Glucose-Capillary: 142 mg/dL — ABNORMAL HIGH (ref 70–99)

## 2019-12-15 NOTE — Progress Notes (Signed)
[name], [name] ([id number]) Visit Report for 12/15/2019 SuperBill Details Patient Name: Date of Service: [name], [name] 12/15/2019 Medical Record Number:[id number] Patient Account Number: [id number] Date of Birth/Sex: Treating RN: [birth year] (79 y.o. 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 other specified severity L97.528 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ity E11.42 Type 2 diabetes mellitus with diabetic polyneuropathy 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, 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 Facility Procedures The patient participates with Medicare or their insurance follows the Medicare Facility Guidelines CPT4 Code Description Modifier Quantity [id number] G0277-(Facility Use Only) HBOT, full body chamber, 41min 4 Physician Procedures CPT4 Code Description Modifier Quantity [id number] 59093 - WC PHYS HYPERBARIC OXYGEN THERAPY 1 ICD-10 Diagnosis Description E11.621 Type 2 diabetes mellitus with foot ulcer Electronic Signature(s) Signed: 12/15/2019 3:59:58 PM By: [name] EMT/HBOT Signed: 12/15/2019 5:53:09 PM By: [name] MD Entered By: [name] on 12/15/2019 15:58:18

## 2019-12-15 NOTE — Progress Notes (Signed)
TIFANNY, DOLLENS ([id number]) Visit Report for 12/15/2019 Arrival Information Details Patient Name: Date of Service: JULES, VIDOVICH 12/15/2019 2:00 PM Medical Record Number:[id number] Patient Account Number: [id number] Date of Birth/Sex: Treating RN: [birth year] (79 y.o. F) Primary Care Lanita Stammen: [name] Other Clinician: [name] Referring Aundreya Souffrant: Treating Amaan Meyer/[name]:[name], [name], [name] Weeks in Treatment: 22 Visit Information History Since Last Visit Added or deleted any medications: No Patient Arrived: Cane Any new allergies or adverse reactions: No Arrival Time: 13:00 Had a fall or experienced change in No Accompanied By: self activities of daily living that may affect Transfer Assistance: None risk of falls: Patient Identification Verified: Yes Signs or symptoms of abuse/neglect since last No Secondary Verification Process Yes visito Completed: Hospitalized since last visit: No Patient Requires Transmission- No Implantable device outside of the clinic excluding No Based Precautions: cellular tissue based products placed in the center Patient Has Alerts: Yes since last visit: Patient Alerts: Right ABI .62 TBI Pain Present Now: No refused Left ABI .60 TBI .14 Electronic Signature(s) Signed: 12/15/2019 3:59:58 PM By: [name] EMT/HBOT Entered By: [name] on 12/15/2019 13:38:14 -------------------------------------------------------------------------------- Encounter Discharge Information Details Patient Name: Date of Service: [name] 12/15/2019 2:00 PM Medical Record Number:[id number] Patient Account Number: [id number] Date of Birth/Sex: Treating RN: 10-27-1940 (79 y.o. F) Primary Care Cyril Railey: [name] Other Clinician: [name] Referring Shelbe Haglund: Treating Leota Maka/[name]:[name], [name], [name] Weeks in Treatment: 22 Encounter Discharge Information Items Discharge  Condition: Stable Ambulatory Status: Cane Discharge Destination: Home Transportation: Private Auto Accompanied By: self Schedule Follow-up Appointment: Yes Clinical Summary of Care: Patient Declined Electronic Signature(s) Signed: 12/15/2019 3:59:58 PM By: [name] EMT/HBOT Entered By: [name] on 12/15/2019 15:58:52 -------------------------------------------------------------------------------- Patient/Caregiver Education Details Patient Name: Date of Service: Falzon, Oleta H. 3/23/2021andnbsp2:00 PM Medical Record Patient Account Number: [id number] Number: Treating RN: Date of Birth/Gender: [birth year] (79 y.o. F) Other Clinician: [name] Primary Care Physician: Verna Czech Referring Physician: Physician/[name]: Darl Pikes in Treatment: 22 Education Assessment Education Provided To: Patient Education Topics Provided Hyperbaric Oxygenation: Methods: Explain/Verbal Responses: State content correctly Electronic Signature(s) Signed: 12/15/2019 3:59:58 PM By: [name] EMT/HBOT Entered By: [name] on 12/15/2019 15:58:37 -------------------------------------------------------------------------------- Vitals Details Patient Name: Date of Service: [name] 12/15/2019 2:00 PM Medical Record Number:[id number] Patient Account Number: [id number] Date of Birth/Sex: Treating RN: May 15, 1941 (79 y.o. F) Primary Care Skyann Ganim: [name] Other Clinician: [name] Referring Cuca Benassi: Treating Tywana Robotham/[name]:[name], [name], [name] Weeks in Treatment: 22 Vital Signs Time Taken: 13:05 Temperature (F): 97.6 Height (in): 64 Pulse (bpm): 61 Weight (lbs): 142 Respiratory Rate (breaths/min): 16 Body Mass Index (BMI): 24.4 Blood Pressure (mmHg): 111/65 Capillary Blood Glucose (mg/dl): 132 Reference Range: 80 - 120 mg / dl Electronic Signature(s) Signed:  12/15/2019 3:59:58 PM By: [name] EMT/HBOT Entered By: [name] on 12/15/2019 13:38:46

## 2019-12-15 NOTE — Progress Notes (Addendum)
VENICE, MARCUCCI ([id number]) Visit Report for 12/15/2019 HBO Details Patient Name: Date of Service: [name], [name] 12/15/2019 2:00 PM Medical Record Number:[id number] Patient Account Number: [id number] Date of Birth/Sex: Treating RN: 1941/09/11 (79 y.o. F) Primary Care Sanja Elizardo: [name] Other Clinician: [name] Referring Rhythm Wigfall: Treating Masashi Snowdon/[name]:[name], [name], [name] Weeks in Treatment: 22 HBO Treatment Course Details Treatment Course Number: 1 Ordering Alicianna Litchford: [name] Total Treatments Ordered: 40 HBO Treatment Start Date: 10/12/2019 HBO Indication: Diabetic Ulcer(s) of the Lower Extremity HBO Treatment Details Treatment Number: 39 Patient Type: Outpatient Chamber Type: Monoplace Chamber Serial #: [id number] Treatment Protocol: 2.5 ATA with 90 minutes oxygen, with two 5 minute air breaks Treatment Details Compression Rate Down: 2.0 psi / minute De-Compression Rate Up: 2.0 psi / minute Air breaks and CompressTx Pressure breathing periods DecompressDecompress Begins Reached (leave unused spaces Begins Ends blank) Chamber Pressure (ATA)1 2.5 2.5 2.5 2.5 2.5 --2.5 1 Clock Time (24 hr) 13:19 13:31 [id number]:7026:[id number]:8850:27--74:12 15:23 Treatment Length: 124 (minutes) Treatment Segments: 4 Vital Signs Capillary Blood Glucose Reference Range: 80 - 120 mg / dl HBO Diabetic Blood Glucose Intervention Range: <131 mg/dl or >249 mg/dl Time Vitals Blood Respiratory Capillary Blood Glucose Pulse Action Type: Pulse: Temperature: Taken: Pressure: Rate: Glucose (mg/dl): Meter #: Oximetry (%) Taken: Pre 13:05 111/65 61 16 97.6 132 Post 15:25 123/35 61 15 98 142 Treatment Response Treatment Toleration: Well Treatment Completion Treatment Completed without Adverse Event Status: Additional Procedure Documentation Tissue Sevierity: Necrosis of bone Bertrum Helmstetter Notes No concerns with treatment given Physician HBO Attestation: I certify that I  supervised this HBO treatment in accordance with Medicare guidelines. A trained Yes emergency response team is readily available per hospital policies and procedures. Continue HBOT as ordered. Yes Electronic Signature(s) Signed: 12/15/2019 5:53:09 PM By: [name] MD Previous Signature: 12/15/2019 3:59:58 PM Version By: [name] EMT/HBOT Entered By: [name] on 12/15/2019 17:52:19 -------------------------------------------------------------------------------- HBO Safety Checklist Details Patient Name: Date of Service: [name] 12/15/2019 2:00 PM Medical Record Number:[id number] Patient Account Number: [id number] Date of Birth/Sex: Treating RN: August 17, 1941 (79 y.o. F) Primary Care Kately Graffam: [name] Other Clinician: [name] Referring Irania Durell: Treating Asier Desroches/[name]:[name], [name], [name] Weeks in Treatment: 22 HBO Safety Checklist Items Safety Checklist Consent Form Signed Patient voided / foley secured and emptied When did you last eato 1200 - PBandbanana sammy/Boost Last dose of injectable or oral agent metformin NA Ostomy pouch emptied and vented if applicable NA All implantable devices assessed, documented and approved NA Intravenous access site secured and place Valuables secured Linens and cotton and cotton/polyester blend (less than 51% polyester) Personal oil-based products / skin lotions / body lotions removed Wigs or hairpieces removed NA Smoking or tobacco materials removed Books / newspapers / magazines / loose paper removed Cologne, aftershave, perfume and deodorant removed Jewelry removed (may wrap wedding band) Make-up removed Hair care products removed NA Battery operated devices (external) removed NA Heating patches and chemical warmers removed NA Titanium eyewear removed Nail polish cured greater than 10 hours NA Casting material cured greater than 10 hours NA Hearing aids removed NA Loose  dentures or partials removed NA Prosthetics have been removed Patient demonstrates correct use of air break device (if applicable) Patient concerns have been addressed Patient grounding bracelet on and cord attached to chamber Specifics for Inpatients (complete in addition to above) Medication sheet sent with patient Intravenous medications needed or due during therapy sent with patient Drainage tubes (e.g. nasogastric tube or chest tube secured and vented) Endotracheal or Tracheotomy  tube secured Cuff deflated of air and inflated with saline Airway suctioned Electronic Signature(s) Signed: 12/15/2019 1:39:45 PM By: [name] EMT/HBOT Entered By: [name] on 12/15/2019 13:39:44

## 2019-12-16 ENCOUNTER — Encounter (HOSPITAL_BASED_OUTPATIENT_CLINIC_OR_DEPARTMENT_OTHER): Payer: Medicare Other | Admitting: Physician Assistant

## 2019-12-16 ENCOUNTER — Other Ambulatory Visit: Payer: Self-pay

## 2019-12-16 DIAGNOSIS — L97514 Non-pressure chronic ulcer of other part of right foot with necrosis of bone: Secondary | ICD-10-CM | POA: Diagnosis not present

## 2019-12-16 DIAGNOSIS — L97528 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ity: Secondary | ICD-10-CM | POA: Diagnosis not present

## 2019-12-16 DIAGNOSIS — Z8631 Personal history of diabetic foot ulcer: Secondary | ICD-10-CM | POA: Diagnosis not present

## 2019-12-16 DIAGNOSIS — E1152 Type 2 diabetes mellitus with diabetic peripheral angiopathy with gangrene: Secondary | ICD-10-CM | POA: Diagnosis not present

## 2019-12-16 DIAGNOSIS — Z7902 Long term (current) use of antithrombotics/antiplatelets: Secondary | ICD-10-CM | POA: Diagnosis not present

## 2019-12-16 DIAGNOSIS — Z7982 Long term (current) use of aspirin: Secondary | ICD-10-CM | POA: Diagnosis not present

## 2019-12-16 DIAGNOSIS — Z4801 Encounter for change or removal of surgical wound dressing: Secondary | ICD-10-CM | POA: Diagnosis not present

## 2019-12-16 DIAGNOSIS — E785 Hyperlipidemia, unspecified: Secondary | ICD-10-CM | POA: Diagnosis not present

## 2019-12-16 DIAGNOSIS — Z89431 Acquired absence of right foot: Secondary | ICD-10-CM | POA: Diagnosis not present

## 2019-12-16 DIAGNOSIS — Z9181 History of falling: Secondary | ICD-10-CM | POA: Diagnosis not present

## 2019-12-16 DIAGNOSIS — E1142 Type 2 diabetes mellitus with diabetic polyneuropathy: Secondary | ICD-10-CM | POA: Diagnosis not present

## 2019-12-16 DIAGNOSIS — M1990 Unspecified osteoarthritis, unspecified site: Secondary | ICD-10-CM | POA: Diagnosis not present

## 2019-12-16 DIAGNOSIS — Z85828 Personal history of other malignant neoplasm of skin: Secondary | ICD-10-CM | POA: Diagnosis not present

## 2019-12-16 DIAGNOSIS — M86371 Chronic multifocal osteomyelitis, right ankle and foot: Secondary | ICD-10-CM | POA: Diagnosis not present

## 2019-12-16 DIAGNOSIS — Z7984 Long term (current) use of oral hypoglycemic drugs: Secondary | ICD-10-CM | POA: Diagnosis not present

## 2019-12-16 DIAGNOSIS — Z79899 Other long term (current) drug therapy: Secondary | ICD-10-CM | POA: Diagnosis not present

## 2019-12-16 DIAGNOSIS — I10 Essential (primary) hypertension: Secondary | ICD-10-CM | POA: Diagnosis not present

## 2019-12-16 DIAGNOSIS — K219 Gastro-esophageal reflux disease without esophagitis: Secondary | ICD-10-CM | POA: Diagnosis not present

## 2019-12-16 DIAGNOSIS — E11621 Type 2 diabetes mellitus with foot ulcer: Secondary | ICD-10-CM | POA: Diagnosis not present

## 2019-12-16 DIAGNOSIS — L97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 limited to breakdown of skin: Secondary | ICD-10-CM | POA: Diagnosis not present

## 2019-12-16 DIAGNOSIS — D649 Anemia, unspecified: Secondary | ICD-10-CM | POA: Diagnosis not present

## 2019-12-16 DIAGNOSIS — Z4781 Encounter for orthopedic aftercare following surgical amputation: Secondary | ICD-10-CM | POA: Diagnosis not present

## 2019-12-16 LAB — GLUCOSE, CAPILLARY
Glucose-Capillary: 102 mg/dL — ABNORMAL HIGH (ref 70–99)
Glucose-Capillary: 105 mg/dL — ABNORMAL HIGH (ref 70–99)
Glucose-Capillary: 118 mg/dL — ABNORMAL HIGH (ref 70–99)
Glucose-Capillary: 159 mg/dL — ABNORMAL HIGH (ref 70–99)

## 2019-12-16 NOTE — Progress Notes (Addendum)
ADIA, CRAMMER ([id number]) Visit Report for 12/16/2019 HBO Details Patient Name: Date of Service: [name], NODAL 12/16/2019 2:00 PM Medical Record Number:[id number] Patient Account Number: [id number] Date of Birth/Sex: Treating RN: 03/03/1941 (79 y.o. F) Primary Care Hajime Asfaw: [name] Other Clinician: [name] Referring Teiana Hajduk: Treating Bonham Zingale/[name]:Stone III, Alen Bleacher, [name] Weeks in Treatment: 22 HBO Treatment Course Details Treatment Course Number: 1 Ordering Naylani Bradner: [name] Total Treatments Ordered: 40 HBO Treatment 10/12/2019 Start Date: HBO Indication: Diabetic Ulcer(s) of the Lower Extremity HBO Treatment 12/16/2019 End Date: HBO Discharge Treatment Series Complete; Improved Outcome: Wound Perfusion HBO Treatment Details Treatment Number: 40 Patient Type: Outpatient Chamber Type: Monoplace Chamber Serial #: [id number] Treatment Protocol: 2.5 ATA with 90 minutes oxygen, with two 5 minute air breaks Treatment Details Compression Rate Down: 2.0 psi / minute De-Compression Rate Up: 2.0 psi / minute Air breaks and CompressTx Pressure breathing periods DecompressDecompress Begins Reached (leave unused spaces Begins Ends blank) Chamber Pressure (ATA)1 2.5 2.5 2.5 2.5 2.5 --2.5 1 Clock Time (24 hr) 13:28 13:40 51:8841:6606:3016:01--09:32 15:32 Treatment Length: 124 (minutes) Treatment Segments: 4 Vital Signs Capillary Blood Glucose Reference Range: 80 - 120 mg / dl HBO Diabetic Blood Glucose Intervention Range: <131 mg/dl or >249 mg/dl Time Vitals Blood Respiratory Capillary Blood Glucose Pulse Action Type: Pulse: Temperature: Taken: Pressure: Rate: Glucose (mg/dl): Meter #: Oximetry (%) Taken: Pre 13:00 116/42 58 17 98.2 125 Post 15:35 158/37 60 16 98.3 159 Treatment Response Treatment Toleration: Well Treatment Completion Treatment Completed without Adverse Event Status: Additional Procedure Documentation Tissue  Sevierity: Necrosis of bone Physician HBO Attestation: I certify that I supervised this HBO treatment in accordance with Medicare guidelines. A trained Yes emergency response team is readily available per hospital policies and procedures. Continue HBOT as ordered. Yes Electronic Signature(s) Signed: 12/16/2019 4:53:54 PM By: Worthy Keeler PA-C Previous Signature: 12/16/2019 3:49:18 PM Version By: [name] EMT/HBOT Entered By: Worthy Keeler on 12/16/2019 16:53:54 -------------------------------------------------------------------------------- HBO Safety Checklist Details Patient Name: Date of Service: [name] 12/16/2019 2:00 PM Medical Record Number:[id number] Patient Account Number: [id number] Date of Birth/Sex: Treating RN: 1941/01/12 (79 y.o. F) Primary Care Mahonri Seiden: [name] Other Clinician: [name] Referring Willadean Guyton: Treating Kathren Scearce/[name]:Stone III, Alen Bleacher, [name] Weeks in Treatment: 22 HBO Safety Checklist Items Safety Checklist Consent Form Signed Patient voided / foley secured and emptied When did you last eato 1200 - PB and banana sammy Last dose of injectable or oral agent metformin NA Ostomy pouch emptied and vented if applicable NA All implantable devices assessed, documented and approved NA Intravenous access site secured and place Valuables secured Linens and cotton and cotton/polyester blend (less than 51% polyester) Personal oil-based products / skin lotions / body lotions removed NA Wigs or hairpieces removed NA Smoking or tobacco materials removed Books / newspapers / magazines / loose paper removed Cologne, aftershave, perfume and deodorant removed Jewelry removed (may wrap wedding band) NA Make-up removed Hair care products removed NA Battery operated devices (external) removed NA Heating patches and chemical warmers removed NA Titanium eyewear removed NA Nail polish cured greater than 10 hours NA  Casting material cured greater than 10 hours NA Hearing aids removed NA Loose dentures or partials removed NA Prosthetics have been removed Patient demonstrates correct use of air break device (if applicable) Patient concerns have been addressed Patient grounding bracelet on and cord attached to chamber Specifics for Inpatients (complete in addition to above) Medication sheet sent with patient Intravenous medications needed or due during therapy sent  with patient Drainage tubes (e.g. nasogastric tube or chest tube secured and vented) Endotracheal or Tracheotomy tube secured Cuff deflated of air and inflated with saline Airway suctioned Electronic Signature(s) Signed: 12/16/2019 2:47:26 PM By: [name] EMT/HBOT Entered By: [name] on 12/16/2019 14:47:25

## 2019-12-16 NOTE — Progress Notes (Signed)
JAYE, SAAL ([id number]) Visit Report for 12/16/2019 Problem List Details Patient Name: Date of Service: [name], [name] 12/16/2019 2:00 PM Medical Record Number:[id number] Patient Account Number: [id number] Date of Birth/Sex: Treating RN: 1940-10-28 (79 y.o. 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 07/13/2019 No Yes with other specified severity L97.528 Non-pressure chronic ulcer of other part of left foot 07/13/2019 No Yes with other specified severity E11.42 Type 2 diabetes mellitus with diabetic polyneuropathy 07/13/2019 No Yes 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, 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 Facility Procedures The patient participates with Medicare or their insurance follows the Medicare Facility Guidelines: CPT4 Code Description Modifier Quantity [id number]-(Facility Use Only) HBOT, full body chamber, 93min 4 Physician Procedures CPT4 Code Description: 4782956 [id number] - WC PHYS HYPERBARIC OXYGEN THERAPY ICD-10 Diagnosis Description E11.621 Type 2 diabetes mellitus with foot ulcer Modifier: Quantity: 1 Electronic Signature(s) Signed: 12/16/2019 4:54:02 PM By: Worthy Keeler PA-C Previous Signature: 12/16/2019 3:49:18 PM Version By: [name] EMT/HBOT Entered By: Worthy Keeler on 12/16/2019 16:54:00

## 2019-12-16 NOTE — Progress Notes (Signed)
BRYAR, RENNIE ([id number]) Visit Report for 12/16/2019 Arrival Information Details Patient Name: Date of Service: ZAKYA, HALABI 12/16/2019 2:00 PM Medical Record Number:[id number] Patient Account Number: [id number] Date of Birth/Sex: Treating RN: 06/12/1941 (79 y.o. F) Primary Care Janda Cargo: [name] Other Clinician: [name] Referring Rachell Druckenmiller: Treating Malikah Lakey/[name]:Stone III, Alen Bleacher, [name] Weeks in Treatment: 61 Visit Information History Since Last Visit Added or deleted any medications: No Patient Arrived: Cane Any new allergies or adverse reactions: No Arrival Time: 12:55 Had a fall or experienced change in No Accompanied By: self activities of daily living that may affect Transfer Assistance: None risk of falls: Patient Identification Verified: Yes Signs or symptoms of abuse/neglect since last No Secondary Verification Process Yes visito Completed: Hospitalized since last visit: No Patient Requires Transmission- No Implantable device outside of the clinic excluding No Based Precautions: cellular tissue based products placed in the center Patient Has Alerts: Yes since last visit: Patient Alerts: Right ABI .62 TBI Pain Present Now: No refused Left ABI .60 TBI .14 Electronic Signature(s) Signed: 12/16/2019 3:49:18 PM By: [name] EMT/HBOT Entered By: [name] on 12/16/2019 14:43:56 -------------------------------------------------------------------------------- Encounter Discharge Information Details Patient Name: Date of Service: [name] 12/16/2019 2:00 PM Medical Record Number:[id number] Patient Account Number: [id number] Date of Birth/Sex: Treating RN: [birth year] (79 y.o. F) Primary Care Vanna Sailer: [name] Other Clinician: [name] Referring Sanaya Gwilliam: Treating Daiya Tamer/[name]:Stone III, Alen Bleacher, [name] Weeks in Treatment: 26 Encounter Discharge Information Items Discharge  Condition: Stable Ambulatory Status: Cane Discharge Destination: Home Transportation: Private Auto Accompanied By: self Schedule Follow-up Appointment: Yes Clinical Summary of Care: Patient Declined Electronic Signature(s) Signed: 12/16/2019 3:49:18 PM By: [name] EMT/HBOT Entered By: [name] on 12/16/2019 15:48:56 -------------------------------------------------------------------------------- Patient/Caregiver Education Details Patient Name: Date of Service: Cillo, Quilla H. 3/24/2021andnbsp2:00 PM Medical Record Patient Account Number: [id number] Number: Treating RN: Date of Birth/Gender: 12-02-1940 (78 y.o. F) Other Clinician: [name] Primary Care Physician: [name] Treating Worthy Keeler Referring Physician: Physician/[name]: Darl Pikes in Treatment: 22 Education Assessment Education Provided To: Patient Education Topics Provided Hyperbaric Oxygenation: Methods: Explain/Verbal Responses: State content correctly Electronic Signature(s) Signed: 12/16/2019 3:49:18 PM By: [name] EMT/HBOT Entered By: [name] on 12/16/2019 15:48:40 -------------------------------------------------------------------------------- Vitals Details Patient Name: Date of Service: [name] 12/16/2019 2:00 PM Medical Record Number:[id number] Patient Account Number: [id number] Date of Birth/Sex: Treating RN: Dec 10, 1940 (79 y.o. F) Primary Care Gayla Benn: [name] Other Clinician: [name] Referring Katheren Jimmerson: Treating Keara Pagliarulo/[name]:Stone III, Alen Bleacher, [name] Weeks in Treatment: 22 Vital Signs Time Taken: 13:00 Temperature (F): 98.2 Height (in): 64 Pulse (bpm): 58 Weight (lbs): 142 Respiratory Rate (breaths/min): 17 Body Mass Index (BMI): 24.4 Blood Pressure (mmHg): 116/42 Capillary Blood Glucose (mg/dl): 125 Reference Range: 80 - 120 mg / dl Electronic Signature(s) Signed:  12/16/2019 3:49:18 PM By: [name] EMT/HBOT Entered By: [name] on 12/16/2019 14:44:24

## 2019-12-17 ENCOUNTER — Encounter (HOSPITAL_BASED_OUTPATIENT_CLINIC_OR_DEPARTMENT_OTHER): Payer: Medicare Other | Admitting: Internal Medicine

## 2019-12-17 DIAGNOSIS — Z961 Presence of intraocular lens: Secondary | ICD-10-CM | POA: Diagnosis not present

## 2019-12-17 DIAGNOSIS — H35372 Puckering of macula, left eye: Secondary | ICD-10-CM | POA: Diagnosis not present

## 2019-12-17 DIAGNOSIS — H47011 Ischemic optic neuropathy, right eye: Secondary | ICD-10-CM | POA: Diagnosis not present

## 2019-12-17 DIAGNOSIS — E113493 Type 2 diabetes mellitus with severe nonproliferative diabetic retinopathy without macular edema, bilateral: Secondary | ICD-10-CM | POA: Diagnosis not present

## 2019-12-18 ENCOUNTER — Encounter (HOSPITAL_BASED_OUTPATIENT_CLINIC_OR_DEPARTMENT_OTHER): Payer: Medicare Other | Admitting: Internal Medicine

## 2019-12-18 ENCOUNTER — Ambulatory Visit (HOSPITAL_BASED_OUTPATIENT_CLINIC_OR_DEPARTMENT_OTHER): Payer: Medicare Other | Admitting: Internal Medicine

## 2019-12-18 ENCOUNTER — Other Ambulatory Visit: Payer: Self-pay

## 2019-12-18 DIAGNOSIS — L97518 Non-pressure chronic ulcer of other part of right foot with other specified severity: Secondary | ICD-10-CM | POA: Diagnosis not present

## 2019-12-18 DIAGNOSIS — M86371 Chronic multifocal osteomyelitis, right ankle and foot: Secondary | ICD-10-CM | POA: Diagnosis not present

## 2019-12-18 DIAGNOSIS — E11621 Type 2 diabetes mellitus with foot ulcer: Secondary | ICD-10-CM | POA: Diagnosis not present

## 2019-12-18 DIAGNOSIS — E1142 Type 2 diabetes mellitus with diabetic polyneuropathy: Secondary | ICD-10-CM | POA: Diagnosis not present

## 2019-12-18 DIAGNOSIS — E1152 Type 2 diabetes mellitus with diabetic peripheral angiopathy with gangrene: Secondary | ICD-10-CM | POA: Diagnosis not present

## 2019-12-18 DIAGNOSIS — L97528 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ity: Secondary | ICD-10-CM | POA: Diagnosis not present

## 2019-12-18 DIAGNOSIS — L97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 limited to breakdown of skin: Secondary | ICD-10-CM | POA: Diagnosis not present

## 2019-12-19 NOTE — Progress Notes (Signed)
[name] ([id number]) Visit Report for 12/18/2019 HPI Details Patient Name: Date of Service: [name] 12/18/2019 2:45 PM Medical Record Number:[id number] Patient Account Number: [id number] Date of Birth/Sex: Treating RN: November 08, 1940 (79 y.o. [name] Primary Care Provider: [name] Other Clinician: Referring Provider: Treating Provider/[name]:Liisa Picone, [name], [name] Weeks in Treatment: 22 History of Present Illness HPI Description: ADMISSION 07/13/2019 Patient is a 79 year old type II diabetic. She has known PAD. She has been followed by Dr. Jacqualyn Posey of podiatry for blistering areas on her toes dating back to 04/30/2019 which at this was the left fourth toe. By 8/11 she had wounds on the right and left second toes. She underwent arterial studies by Dr. Alvester Chou on 7/31 that showed ABIs on the right of 0.60 TBI of 0.26 on the left ABI of 0.56 and a TBI of 0.25. By 9/10 she had ischemic-looking wounds per Dr. Earleen Newport on the right first, left first second and third. She has been using Medihoney and then mupirocin more recently simply Betadine. The patient underwent angiography by Dr. Gwenlyn Found on 9/21. 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 Noted that there was 1 vessel runoff in the foot via the peroneal. On the left there was an 80% left SFA, 70% mid left SFA. There was a short segment calcified CTO distal less than SFA and above-knee popliteal artery reconstituting with two-vessel runoff. 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 It was felt that she had bilateral SFA disease as well as tibial vessel disease. An attempt was made to revascularize the left SFA but they were unable to cross because of the highly calcified nature of the lesion. 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 She has an area on the medial left fourth toe with raised horned callus on top of this. I am not certain what this represents. With regards to pain she has about a 2-1/2 I will claudication tolerance in the grocery store. She has some pain in night which is relieved by putting her feet down over the side of the bed. She is wearing Nitro-Dur patches on the top of her right foot. Past medical history includes type 2 diabetes with secondary PAD, neoplasm of the skin, diabetic retinopathy, carotid artery stenosis, hyperlipidemia hypertension. 11/2; the last time the patient was here I spoke to Dr. Gwenlyn Found about revascularization options on the right. As I understand things currently Dr. Gwenlyn Found spoke with Dr. Trula Slade and ultimately the patient was taken to surgery on 10/27. She had a right iliofemoral endarterectomy with a bovine patch angioplasty. I think the plan now is for her to have a staged intervention on the right SFA by Dr. Alvester Chou. Per the patient's understanding Dr. Gwenlyn Found and Dr. Jacqualyn Posey are waiting to see when the gangrenous toes on the right foot can be amputated. The patient states her pain is a lot better and she is grateful for that. 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 Small area on the left fourth toe. She is using Betadine to the mummified areas on the right and Medihoney on the left 08/27/2019; the last time I saw this patient I discharged her from the clinic. She had been revascularized by Dr. Trula Slade and she had  a right iliofemoral endarterectomy with a bovine angioplasty. 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 Jacqualyn Posey of podiatry on 08/07/2019. I note that she also had a intervention by Dr. Gwenlyn Found and he performed a directional atherectomy and drug-coated balloon angioplasty of the SFA and popliteal artery on the right. I  am not certain of the date of Dr. Kennon Holter procedure as of the time of this dictation. She was referred back to [place] by Dr. Earleen Newport predominantly for follow-up of the left fourth toe. She still has sutures and stitches in the right TMA site. She states her pain is a lot better. She expresses concern about the condition of the amputation site at the TMA. She is on doxycycline I think prescribed by Dr. Earleen Newport. She is complaining of some pain at night 12/10; I spoke to Dr. Jacqualyn Posey last week. He removed the sutures on the right foot on Monday of this week. She has the area on the left fourth toe just proximal to the PIP and then the right TMA site. She is still on doxycycline and has enough through next week. Unfortunately the TMA site does not look good at all. Both on the lateral and medial part of the incisions are areas that probe to bone. There is purulence over the medial part which I have cultured. We will use silver alginate. Left fourth toe looks somewhat better but there was still exposed bone 12/17; patient has an MRI booked for 12/30. Culture I did last week showed Pseudomonas Serratia and Enterococcus. This was purulent drainage coming out of the medial part of her amputation site. I use cefdinir 300 twice daily for 10 days that started on 12/15. Her x-ray on the right showed limited evaluation for osteomyelitis. The findings could have been postoperative. There was subtle erosion in the distal first and distal fifth metatarsal. An MRI was suggested. On the left she had irregularity of the fourth middle and proximal phalanx consistent with a history of osteomyelitis. I do not know that she has a history of osteomyelitis in this area. She had a newly defined area on the plantar third toe 12/31; patient's MRI is listed below: MRI OF THE RIGHT FOREFOOT WITHOUT AND WITH CONTRAST TECHNIQUE: Multiplanar, multisequence MR imaging of the right forefoot was performed before and after the  administration of intravenous contrast. CONTRAST: 6 mL GADAVIST IV SOLN COMPARISON: Plain films right foot 09/04/2019. FINDINGS: Bones/Joint/Cartilage The patient is status post transmetatarsal amputation as seen on the prior exam. Marrow edema and enhancement are seen in all of the distal metatarsals. In the first metatarsal, signal change extends 1.5 cm proximal to the stump and in the second metatarsal extends approximately 2 cm proximal to the stump. Edema and enhancement are seen in only the distal 0.7 cm of the third metatarsal stump and tips of the fourth and fifth metatarsals. Bone marrow signal is otherwise unremarkable. A small focus of subchondral edema is seen in the lateral talus, likely degenerative. Ligaments Intact. Muscles and Tendons No intramuscular fluid collection. Soft tissues Skin ulceration is seen off the stump of the first metatarsal. A thin fluid collection tracks deep to the wound and over the anterior metatarsals worrisome for small abscess. Intense subcutaneous edema and enhancement are seen diffusely. 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 Cellulitis about the foot. Skin ulceration over the distal first metatarsal is  identified with a thin fluid collection tracking anteriorly along the stump worrisome for abscess. Small focus of subchondral edema in the lateral talus is likely degenerative. Electronically Signed By: Inge Rise M.D. On: 09/23/2019 15:25 Patient arrives in clinic today not complaining any of any pain. 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 She does not describe claudication however her activity is very limited. 10/01/2019; since the patient was last here I have communicated with Dr. Gwenlyn Found after bypass by  Dr. Trula Slade and addressing the right superficial femoral artery he states that she is widely patent through peroneal arteries to the ankle with collaterals to the dorsalis pedis. He states he is going to talk to colleagues about the feasibility of tibial pedal access. The patient seems infectious disease later this afternoon Dr. Baxter Flattery. in preparation for this she has been off antibiotics for 1 week and I went ahead and obtain pieces of the remanence of her first metatarsal for pathology and CandS. The patient is a candidate for hyperbaric oxygen. 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 1/15; considerable improvement in the wounds on her feet. We are using silver collagen. She follows with Dr. Baxter Flattery of infectious disease and is on meropenem and daptomycin. She has been taught how to do this herself at home. Follows with Dr. Graylon Good at the end of treatment here. She has 2 wounds on the surgical TMA site 1 lateral and 1 medial the lateral 1 has regressed tremendously. The area medially still has some exposed bone although the base of this looks healthy. 1/22; 2 separate wounds on the surgical TMA site. Both of these look satisfactory. The medial area does not have any exposed bone. This is an improvement On the left side fourth toe dorsally over the proximal phalanx there is a deep punched out area that probes to bone. She has an area on the tip of the left third toe. She also tells [place] that in HBO she traumatized her left Achilles and this is left her with a superficial wound area 1/28; weekly visit along with HBO. She has 5 wounds. To our punched out areas on the original TMA site on the right. Both of these appear to have contracted. The area on the right no longer has exposed bone. She has an area on the tip of the left third toe and on the DIP of the left fourth toe. Both of these had surface callus that I removed and unfortunately they have  small areas that both go to bone. She has a traumatic wound on the left Achilles area that happened in HBO and that appears better. She is tolerating her IV antibiotics well at home. She has home health changing the dressings and she is doing it once on the weekends. We have been using silver collagen. She has been extensively revascularized on the right by Dr. Trula Slade and subsequently by Dr. Gwenlyn Found. 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 Baxter Flattery of infectious disease on 2/17. Dr. Gwenlyn Found on 2/23 and according to her her antibiotics stopped on 2/23. She is tolerating hyperbarics well. She has made a tremendous improvement in the right forefoot with only 2 smaller open wounds. Using silver collagen. On the left foot she has the area on the  tip of the left third toe and the medial part of the left fourth toe. These had exposed bone last week I did not sense any of that today 2/12; sees Dr. Graylon Good on 2/17 and Dr. Gwenlyn Found on 2/23. We are going to use Dermagraft on this today however the lateral part of her train TMA incision on the right is healed and the medial part is down so much that we just continue with the silver collagen She has wounds on the tip of her left third and the medial aspect of the left fourth. Both of these still have exposed bone I have not been able to get these to epithelialize. 2/19; she sees Dr. Gwenlyn Found on 2/23 and I have communicated with him about the left vascular supply. Looking at her angiogram from 08/03/2019 it looks as though that they could not cross the left CFA. Noted that she had one-vessel runoff bilaterally. She also apparently saw Dr. Graylon Good although I did not look up these results for preparation of this record. 2/26; Dr. Gwenlyn Found is going to do an angiogram next week on Wednesday. I think they are going to try to go at this  both retrograde and anterograde to see if anything can be done to revascularize the left lower limb. She continues to make nice progress on the remaining wound on the right medial foot on her TMA site and the toe it wounds on the left are responding nicely as well 3/12; the patient underwent an extensive and complicated revascularization on the left leg by Dr. Fletcher Anon on 3/3; she had an atherectomy on occluded left popliteal artery; anterior tibial artery followed by a balloon angioplasty. Atherectomy was also performed to the left SFA because there was still significant 50% stenosis in the left popliteal artery they performed an intravascular lithotripsy which improved the residual stenosis to 20%. The same lithotripsy was used to dilate the proximal left SFA. She had a drug-eluting stent placed I believe in the SFA. The patient returned for hyperbarics this week. She has had some eye problems on the right which she tells me are secondary to diabetic retinopathy and she saw her eye doctor. She is really made excellent progress there is no open wounds on the left foot at all. She has one open area medially and her TMA site on the right lateral wound is closed. 3/19; the patient comes in today with the area on the medial transmet on the right improved. Some debris removed from the surface revealed the still open wound. Unfortunately she now has the open area on the third toe tip and the medial aspect of the dorsal fourth toe. These are in the same location as her previous wounds. These had actually closed up last week. 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 She is undergone complex revascularizations on the right by Dr. Trula Slade and Dr. Gwenlyn Found and I believe Dr. Fletcher Anon worked on the tibial vessels on the left most recently.she had a complex transmetatarsal amputation wound with underlying osteomyelitis on presentation to the clinic and then developed deep  wounds on the plantar left third toe tip and on the medial part of the left fourth toe at the PIP. She completed IVantibiotics as directed by infectious disease and I believe a course of oral antibiotics as well.she underwent a complete 40 treatments of hyperbarics. She comes in today with the vast majority of the right TMA site healed there is a small opening on the most medial  aspect. Her left third and left fourth toes appear to have epithelialization although this has fluctuated somewhat. Electronic Signature(s) Signed: 12/19/2019 8:02:09 AM By: [name] MD Entered By: [name] on 12/19/2019 07:54:05 -------------------------------------------------------------------------------- Physical Exam Details Patient Name: Date of Service: [name] 12/18/2019 2:45 PM Medical Record Number:[id number] Patient Account Number: [id number] Date of Birth/Sex: Treating RN: 1940/11/08 (79 y.o. [name] Primary Care Provider: [name] Other Clinician: Referring Provider: Treating Provider/[name]:Ethleen Lormand, [name], [name] Weeks in Treatment: 22 Constitutional Sitting or standing Blood Pressure is within target range for patient.. Pulse regular and within target range for patient.Marland Kitchen Respirations regular, non-labored and within target range.. Temperature is normal and within the target range for the patient.Marland Kitchen Appears in no distress. Cardiovascular dorsalis pedis pulses are palpable bilaterally albeit slightly reduced. I don't feel posterior tibial pulses nevertheless her feet are warm. Notes wound exam; TMA site has only one small open area on the most medial part of the original incision. This looks clean although it has some depth. On the left foot the left third toe tip has healed over as has the medial part of the left fourth toe at the level of the PIP. There is really no evidence of infection anywhere to be seen. New tissue  looks healthy. Electronic Signature(s) Signed: 12/19/2019 8:02:09 AM By: [name] MD Entered By: [name] on 12/19/2019 07:56:17 -------------------------------------------------------------------------------- Physician Orders Details Patient Name: Date of Service: [name] 12/18/2019 2:45 PM Medical Record Number:[id number] Patient Account Number: [id number] Date of Birth/Sex: Treating RN: May 23, 1941 (79 y.o. 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 Dressing Change Frequency Change Dressing every other day. - home health to change three times a week Wound Cleansing Clean wound with Wound Cleanser Primary Wound Dressing Wound #7 Right Amputation Site - Transmetatarsal Silver Collagen - moisten with hydrogel. Secondary Dressing Wound #7 Right Amputation Site - Transmetatarsal Kerlix/Rolled Gauze ABD pad Edema Control Avoid standing for long periods of time Elevate legs to the level of the heart or above for 30 minutes daily and/or when sitting, a frequency of: - throughout the day. Off-Loading DH Walker Boot to: - right foot Open toe surgical shoe to: - felt in  surgical shoe for 3rd toe. wound center to provide patient with shoe today. [place] skilled nursing for wound care. - [facility] three times a week. Electronic Signature(s) Signed: 12/18/2019 5:41:24 PM By: Kela Millin Signed: 12/19/2019 8:02:09 AM By: [name] MD Entered By: Kela Millin on 12/18/2019 16:44:22 -------------------------------------------------------------------------------- Problem List Details Patient Name: Date of Service: [name] 12/18/2019 2:45 PM Medical Record Number:[id number] Patient Account Number: [id number] Date of Birth/Sex: Treating RN: 15-Apr-1941 (79 y.o. [name] Primary Care Provider: [name] Other Clinician: Referring Provider: Treating Provider/[name]:Cloa Bushong, [name], [name] Weeks in Treatment: 22 Active Problems ICD-10 Evaluated Encounter Code Description Active  Date Today Diagnosis E11.621 Type 2 diabetes mellitus with foot ulcer 07/13/2019 No Yes E11.52 Type 2 diabetes mellitus with diabetic peripheral 07/13/2019 No Yes angiopathy with gangrene L97.518 Non-pressure chronic ulcer of other part of right foot 07/13/2019 No Yes with other specified severity L97.528 Non-pressure chronic ulcer of other part of left foot 07/13/2019 No Yes with other specified severity E11.42 Type 2 diabetes mellitus with diabetic polyneuropathy 07/13/2019 No Yes 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, right ankle and foot 10/01/2019 No Yes L97.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 10/16/2019 No Yes limited to breakdown of skin Inactive Problems Resolved Problems Electronic Signature(s) Signed: 12/19/2019 8:02:09 AM By: [name] MD Previous Signature: 12/18/2019 5:41:24 PM Version By: Kela Millin Entered By: [name] on 12/19/2019 07:49:10 -------------------------------------------------------------------------------- Progress Note  Details Patient Name: Date of Service: [name] 12/18/2019 2:45 PM Medical Record Number:[id number] Patient Account Number: [id number] Date of Birth/Sex: Treating RN: [birth year] (79 y.o. [name] Primary Care Provider: [name] Other Clinician: Referring Provider: Treating Provider/[name]:Witney Huie, [name], [name] Weeks in Treatment: 22 Subjective History of Present Illness (HPI) ADMISSION 07/13/2019 Patient is a 79 year old type II diabetic. She has known PAD. She has been followed by Dr. Jacqualyn Posey of podiatry for blistering areas on her toes dating back to 04/30/2019 which at this was the left fourth toe. By 8/11 she had wounds on the right and left second toes. She underwent arterial studies by Dr. Alvester Chou on 7/31 that showed ABIs on the right of 0.60 TBI of 0.26 on the left ABI of 0.56 and a TBI of 0.25. By 9/10 she had ischemic-looking wounds per Dr. Earleen Newport on the right first, left first second and third. She has been using Medihoney and then mupirocin more recently simply Betadine. The patient underwent angiography by Dr. Gwenlyn Found on 9/21. 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 Noted that there was 1 vessel runoff in the foot via the peroneal. ooOn the left there was an 80% left SFA, 70% mid left SFA. There was a short segment calcified CTO distal less than SFA and above-knee popliteal artery reconstituting with two-vessel runoff. 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 It was felt that she had bilateral SFA disease as well as tibial vessel disease. An attempt was made to revascularize the left SFA but they were unable to cross because of the highly calcified nature of the lesion. 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 She has an area on the medial left fourth toe with raised horned callus on top of this. I am not certain what this represents. With regards to pain she has about a 2-1/2 I will claudication tolerance in the grocery store. She has some pain in night which is relieved by putting her feet down over the side of the bed. She is wearing Nitro-Dur patches on the top of her right foot. Past medical history includes type 2 diabetes with secondary PAD, neoplasm of the skin, diabetic retinopathy, carotid artery stenosis, hyperlipidemia hypertension. 11/2; the last time the patient was here I spoke to Dr. Gwenlyn Found about revascularization options on the right. As I understand things currently Dr. Gwenlyn Found spoke with Dr. Trula Slade and ultimately the patient was taken to surgery on 10/27. She had a right iliofemoral  endarterectomy with a bovine patch angioplasty. I think the plan now is for her to have a staged intervention on the right SFA by Dr. Alvester Chou. Per the patient's understanding Dr. Gwenlyn Found and Dr. Jacqualyn Posey are waiting to see when the gangrenous toes on the right foot can be amputated. The patient states her pain is a lot better and she is grateful for that. 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 Small area on the left fourth toe. She is using Betadine to the mummified areas on the right and Medihoney on the left 08/27/2019; the last time I saw this patient I discharged her from the clinic. She had been revascularized by Dr. Trula Slade and she had a right iliofemoral endarterectomy with a bovine angioplasty. 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 Jacqualyn Posey of podiatry on 08/07/2019. I note that she also had a intervention by Dr. Gwenlyn Found and he performed a directional atherectomy and drug-coated balloon angioplasty of the SFA and popliteal artery on the right. I am not certain of the date of Dr. Kennon Holter procedure  as of the time of this dictation. She was referred back to [place] by Dr. Earleen Newport predominantly for follow-up of the left fourth toe. She still has sutures and stitches in the right TMA site. She states her pain is a lot better. She expresses concern about the condition of the amputation site at the TMA. She is on doxycycline I think prescribed by Dr. Earleen Newport. She is complaining of some pain at night 12/10; I spoke to Dr. Jacqualyn Posey last week. He removed the sutures on the right foot on Monday of this week. She has the area on the left fourth toe just proximal to the PIP and then the right TMA site. She is still on doxycycline and has enough through next week. Unfortunately the TMA site does not look good at all. Both on the lateral and medial part of the incisions are areas that probe to bone. There is purulence over the medial part which I have cultured. We will use silver alginate. Left fourth toe looks somewhat better but there was still exposed bone 12/17; patient has an MRI booked for 12/30. Culture I did last week showed Pseudomonas Serratia and Enterococcus. This was purulent drainage coming out of the medial part of her amputation site. I use cefdinir 300 twice daily for 10 days that started on 12/15. Her x-ray on the right showed limited evaluation for osteomyelitis. The findings could have been postoperative. There was subtle erosion in the distal first and distal fifth metatarsal. An MRI was suggested. On the left she had irregularity of the fourth middle and proximal phalanx consistent with a history of osteomyelitis. I do not know that she has a history of osteomyelitis in this area. She had a newly defined area on the plantar third toe 12/31; patient's MRI is listed below: MRI OF THE RIGHT FOREFOOT WITHOUT AND WITH CONTRAST TECHNIQUE: Multiplanar, multisequence MR imaging of the right forefoot was performed before and after the administration of intravenous contrast. CONTRAST: 6 mL  GADAVIST IV SOLN COMPARISON: Plain films right foot 09/04/2019. FINDINGS: Bones/Joint/Cartilage The patient is status post transmetatarsal amputation as seen on the prior exam. Marrow edema and enhancement are seen in all of the distal metatarsals. In the first metatarsal, signal change extends 1.5 cm proximal to the stump and in the second metatarsal extends approximately 2 cm proximal to the stump. Edema and enhancement  are seen in only the distal 0.7 cm of the third metatarsal stump and tips of the fourth and fifth metatarsals. Bone marrow signal is otherwise unremarkable. A small focus of subchondral edema is seen in the lateral talus, likely degenerative. Ligaments Intact. Muscles and Tendons No intramuscular fluid collection. Soft tissues Skin ulceration is seen off the stump of the first metatarsal. A thin fluid collection tracks deep to the wound and over the anterior metatarsals worrisome for small abscess. Intense subcutaneous edema and enhancement are seen diffusely. 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 Cellulitis about the foot. Skin ulceration over the distal first metatarsal is identified with a thin fluid collection tracking anteriorly along the stump worrisome for abscess. Small focus of subchondral edema in the lateral talus is likely degenerative. Electronically Signed By: Inge Rise M.D. On: 09/23/2019 15:25 Patient arrives in clinic today not complaining any of any pain. 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 She does not describe claudication however her activity is very limited. 10/01/2019; since the patient was last here I have communicated with Dr. Gwenlyn Found after bypass by Dr. Trula Slade and addressing the right superficial  femoral artery he states that she is widely patent through peroneal arteries to the ankle with collaterals to the dorsalis pedis. He states he is going to talk to colleagues about the feasibility of tibial pedal access. The patient seems infectious disease later this afternoon Dr. Baxter Flattery. in preparation for this she has been off antibiotics for 1 week and I went ahead and obtain pieces of the remanence of her first metatarsal for pathology and CandS. The patient is a candidate for hyperbaric oxygen. 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 1/15; considerable improvement in the wounds on her feet. We are using silver collagen. She follows with Dr. Baxter Flattery of infectious disease and is on meropenem and daptomycin. She has been taught how to do this herself at home. Follows with Dr. Graylon Good at the end of treatment here. She has 2 wounds on the surgical TMA site 1 lateral and 1 medial the lateral 1 has regressed tremendously. The area medially still has some exposed bone although the base of this looks healthy. 1/22; 2 separate wounds on the surgical TMA site. Both of these look satisfactory. The medial area does not have any exposed bone. This is an improvement On the left side fourth toe dorsally over the proximal phalanx there is a deep punched out area that probes to bone. She has an area on the tip of the left third toe. She also tells [place] that in HBO she traumatized her left Achilles and this is left her with a superficial wound area 1/28; weekly visit along with HBO. She has 5 wounds. To our punched out areas on the original TMA site on the right. Both of these appear to have contracted. The area on the right no longer has exposed bone. She has an area on the tip of the left third toe and on the DIP of the left fourth toe. Both of these had surface callus that I removed and unfortunately they have small areas that both go to bone. She has a traumatic  wound on the left Achilles area that happened in HBO and that appears better. She is tolerating her IV antibiotics well at  home. She has home health changing the dressings and she is doing it once on the weekends. We have been using silver collagen. She has been extensively revascularized on the right by Dr. Trula Slade and subsequently by Dr. Gwenlyn Found. 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 Baxter Flattery of infectious disease on 2/17. Dr. Gwenlyn Found on 2/23 and according to her her antibiotics stopped on 2/23. She is tolerating hyperbarics well. She has made a tremendous improvement in the right forefoot with only 2 smaller open wounds. Using silver collagen. On the left foot she has the area on the tip of the left third toe and the medial part of the left fourth toe. These had exposed bone last week I did not sense any of that today 2/12; sees Dr. Graylon Good on 2/17 and Dr. Gwenlyn Found on 2/23. We are going to use Dermagraft on this today however the lateral part of her train TMA incision on the right is healed and the medial part is down so much that we just continue with the silver collagen She has wounds on the tip of her left third and the medial aspect of the left fourth. Both of these still have exposed bone I have not been able to get these to epithelialize. 2/19; she sees Dr. Gwenlyn Found on 2/23 and I have communicated with him about the left vascular supply. Looking at her angiogram from 08/03/2019 it looks as though that they could not cross the left CFA. Noted that she had one-vessel runoff bilaterally. She also apparently saw Dr. Graylon Good although I did not look up these results for preparation of this record. 2/26; Dr. Gwenlyn Found is going to do an angiogram next week on Wednesday. I think they are going to try to go at this both retrograde and anterograde to see if anything can be done to  revascularize the left lower limb. She continues to make nice progress on the remaining wound on the right medial foot on her TMA site and the toe it wounds on the left are responding nicely as well 3/12; the patient underwent an extensive and complicated revascularization on the left leg by Dr. Fletcher Anon on 3/3; she had an atherectomy on occluded left popliteal artery; anterior tibial artery followed by a balloon angioplasty. Atherectomy was also performed to the left SFA because there was still significant 50% stenosis in the left popliteal artery they performed an intravascular lithotripsy which improved the residual stenosis to 20%. The same lithotripsy was used to dilate the proximal left SFA. She had a drug-eluting stent placed I believe in the SFA. The patient returned for hyperbarics this week. She has had some eye problems on the right which she tells me are secondary to diabetic retinopathy and she saw her eye doctor. She is really made excellent progress there is no open wounds on the left foot at all. She has one open area medially and her TMA site on the right lateral wound is closed. 3/19; the patient comes in today with the area on the medial transmet on the right improved. Some debris removed from the surface revealed the still open wound. ooUnfortunately she now has the open area on the third toe tip and the medial aspect of the dorsal fourth toe. These are in the same location as her previous wounds. These had actually closed up last week. 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 She is undergone complex revascularizations on the right by Dr. Trula Slade and Dr. Gwenlyn Found and I believe Dr. Fletcher Anon worked on the tibial vessels on the left most recently.she had a complex transmetatarsal amputation wound with underlying osteomyelitis on presentation to the clinic and then developed deep wounds on the plantar left third toe tip and on the medial part  of the left fourth toe at the PIP. She completed IVantibiotics as directed by infectious disease and I believe a course of oral antibiotics as well.she underwent a complete 40 treatments of hyperbarics. She comes in today with the vast majority of the right TMA site healed there is a small opening on the most medial aspect. Her left third and left fourth toes appear to have epithelialization although this has fluctuated somewhat. Objective Constitutional Sitting or standing Blood Pressure is within target range for patient.. Pulse regular and within target range for patient.Marland Kitchen Respirations regular, non-labored and within target range.. Temperature is normal and within the target range for the patient.Marland Kitchen Appears in no distress. Vitals Time Taken: 4:00 PM, Height: 64 in, Weight: 142 lbs, BMI: 24.4, Temperature: 98.2 F, Pulse: 59 bpm, Respiratory Rate: 18 breaths/min, Blood Pressure: 121/40 mmHg, Capillary Blood Glucose: 111 mg/dl. Cardiovascular dorsalis pedis pulses are palpable bilaterally albeit slightly reduced. I don't feel posterior tibial pulses nevertheless her feet are warm. General Notes: wound exam; TMA site has only one small open area on the most medial part of the original incision. This looks clean although it has some depth. On the left foot the left third toe tip has healed over as has the medial part of the left fourth toe at the level of the PIP. There is really no evidence of infection anywhere to be seen. New tissue looks healthy. Integumentary (Hair, Skin) Wound #1R status is Healed - Epithelialized. Original cause of wound was Blister. The wound is located on the Left,Dorsal Toe Fourth. The wound measures 0cm length x 0cm width x 0cm depth; 0cm^2 area and 0cm^3 volume. There is no tunneling or undermining noted. There is a none present amount of drainage noted. The wound margin is distinct with the outline attached to the wound base. There is no granulation within the  wound bed. There is no necrotic tissue within the wound bed. Wound #7 status is Open. Original cause of wound was Surgical Injury. 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 The wound measures 0.2cm length x 0.7cm width x 0.1cm depth; 0.11cm^2 area and 0.011cm^3 volume. There is Fat Layer (Subcutaneous Tissue) Exposed exposed. There is no tunneling or undermining noted. There is a small amount of serosanguineous drainage noted. The wound margin is well defined and not attached to the wound base. There is large (67-100%) red granulation within the wound bed. There is no necrotic tissue within the wound bed. Wound #8R status is Healed - Epithelialized. Original cause of wound was Gradually Appeared. The wound is located on the Left Toe Third. The wound measures 0cm length x 0cm width x 0cm depth; 0cm^2 area and 0cm^3 volume. There is Fat Layer (Subcutaneous Tissue) Exposed exposed. There is no tunneling or undermining noted. There is a none present amount of drainage noted. The wound margin is distinct with the outline attached to the wound base. There is no granulation within the wound bed. There is no necrotic tissue within the wound bed. 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 Dressing Change Frequency: Change Dressing every other day. - home health to change three times a week Wound Cleansing: Clean wound with Wound Cleanser Primary Wound  Dressing: Wound #7 Right Amputation Site - Transmetatarsal: Silver Collagen - moisten with hydrogel. Secondary Dressing: Wound #7 Right Amputation Site - Transmetatarsal: Kerlix/Rolled Gauze ABD pad Edema Control: Avoid standing for long periods of time Elevate legs to the level of the heart or above for 30 minutes daily and/or when sitting, a frequency of: - throughout the day. Off-Loading: DH Walker Boot to: - right foot Open toe surgical shoe to: - felt in surgical shoe for 3rd toe. wound center to provide patient with shoe today. Home Health: [facility] - [facility] three times a week. #1 we are using silver collagen with hydrogel to the remaining small area of the medial transmetatarsal amputation site. We appear to be making good progress. There is no evidence of infection her #2 there is nothing open on the left foot today although this appears to have fluctuated a bit #3 she completed IV antibiotics as well as hyperbarics. She has had a superb response. #4 I reviewed what we know about the left foot I don't think there is a need for any additional antibiotics here #5 the patient asked me about another MRI to follow whether the osteomyelitis in the right foot was truly resolved. I told her that it is not my usual practice to do this in people whose clinical status is improved as much as hers has including excellent wound healing. I will check before I see her next in 2 weeks about her most recent sedimentation rate and C-reactive protein if that was ordered by infectious disease #6 I told her that she will need to rigorously offload these areas in any footwear she uses. Especially problematic and seems to be the tip of the left third toe where it will not be able to rub on the plantar aspect of any shoe. Similarly the left third and fourth toe need to be separated. I don't believe that there is any active infection at this point in  these toes Electronic Signature(s) Signed: 12/19/2019 8:02:09 AM By: [name] MD Entered By: [name] on 12/19/2019 07:59:46 -------------------------------------------------------------------------------- SuperBill Details Patient Name: Date of Service: [name] 12/18/2019 Medical Record Number:[id number] Patient Account Number: [id number] Date of Birth/Sex: Treating RN: 23-Mar-1941 (79 y.o. [name] Primary Care Provider: [name] Other Clinician: Referring Provider: Treating Provider/[name]:Anginette Espejo, [name], [name] Weeks in Treatment: 22 Diagnosis Coding ICD-10 Codes Code Description E11.621 Type 2 diabetes mellitus with foot ulcer E11.52 Type 2 diabetes mellitus with diabetic peripheral angiopathy with gangrene L97.518 Non-pressure chronic ulcer of other part of right foot with other specified severity L97.528 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ity E11.42 Type 2 diabetes mellitus with diabetic polyneuropathy 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, 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 Facility Procedures The patient participates with Medicare or their insurance follows the  Medicare Facility Guidelines: CPT4 Code Description Modifier Quantity [id number] [facility] VISIT-LEV 4 EST PT 1 Physician Procedures CPT4 Code Description: [id number] - WC PHYS LEVEL 4 - EST PT ICD-10 Diagnosis Description L97.518 Non-pressure chronic ulcer of other part of right foot w L97.528 Non-pressure chronic ulcer of other part of left foot wi M86.371 Chronic multifocal  osteomyelitis, right ankle and foot E11.621 Type 2 diabetes mellitus with foot ulcer Modifier: ith other specifi th other specifie Quantity: 1 ed severity d severity Electronic Signature(s) Signed: 12/19/2019 8:02:09 AM By: [name] MD Previous  Signature: 12/18/2019 5:41:24 PM Version By: Kela Millin Entered By: [name] on 12/19/2019 08:00:42

## 2019-12-21 ENCOUNTER — Encounter (HOSPITAL_BASED_OUTPATIENT_CLINIC_OR_DEPARTMENT_OTHER): Payer: Medicare Other | Admitting: Internal Medicine

## 2019-12-21 ENCOUNTER — Other Ambulatory Visit: Payer: Self-pay | Admitting: Internal Medicine

## 2019-12-21 DIAGNOSIS — I10 Essential (primary) hypertension: Secondary | ICD-10-CM | POA: Diagnosis not present

## 2019-12-21 DIAGNOSIS — D649 Anemia, unspecified: Secondary | ICD-10-CM | POA: Diagnosis not present

## 2019-12-21 DIAGNOSIS — K219 Gastro-esophageal reflux disease without esophagitis: Secondary | ICD-10-CM | POA: Diagnosis not present

## 2019-12-21 DIAGNOSIS — E1152 Type 2 diabetes mellitus with diabetic peripheral angiopathy with gangrene: Secondary | ICD-10-CM | POA: Diagnosis not present

## 2019-12-21 DIAGNOSIS — E785 Hyperlipidemia, unspecified: Secondary | ICD-10-CM | POA: Diagnosis not present

## 2019-12-21 DIAGNOSIS — Z4781 Encounter for orthopedic aftercare following surgical amputation: Secondary | ICD-10-CM | POA: Diagnosis not present

## 2019-12-22 ENCOUNTER — Encounter (HOSPITAL_BASED_OUTPATIENT_CLINIC_OR_DEPARTMENT_OTHER): Payer: Medicare Other | Admitting: Internal Medicine

## 2019-12-23 ENCOUNTER — Encounter (HOSPITAL_BASED_OUTPATIENT_CLINIC_OR_DEPARTMENT_OTHER): Payer: Medicare Other | Admitting: Physician Assistant

## 2019-12-23 DIAGNOSIS — E785 Hyperlipidemia, unspecified: Secondary | ICD-10-CM | POA: Diagnosis not present

## 2019-12-23 DIAGNOSIS — D649 Anemia, unspecified: Secondary | ICD-10-CM | POA: Diagnosis not present

## 2019-12-23 DIAGNOSIS — Z4781 Encounter for orthopedic aftercare following surgical amputation: Secondary | ICD-10-CM | POA: Diagnosis not present

## 2019-12-23 DIAGNOSIS — I10 Essential (primary) hypertension: Secondary | ICD-10-CM | POA: Diagnosis not present

## 2019-12-23 DIAGNOSIS — E1152 Type 2 diabetes mellitus with diabetic peripheral angiopathy with gangrene: Secondary | ICD-10-CM | POA: Diagnosis not present

## 2019-12-23 DIAGNOSIS — K219 Gastro-esophageal reflux disease without esophagitis: Secondary | ICD-10-CM | POA: Diagnosis not present

## 2019-12-25 DIAGNOSIS — Z4781 Encounter for orthopedic aftercare following surgical amputation: Secondary | ICD-10-CM | POA: Diagnosis not present

## 2019-12-25 DIAGNOSIS — D649 Anemia, unspecified: Secondary | ICD-10-CM | POA: Diagnosis not present

## 2019-12-25 DIAGNOSIS — I10 Essential (primary) hypertension: Secondary | ICD-10-CM | POA: Diagnosis not present

## 2019-12-25 DIAGNOSIS — E785 Hyperlipidemia, unspecified: Secondary | ICD-10-CM | POA: Diagnosis not present

## 2019-12-25 DIAGNOSIS — K219 Gastro-esophageal reflux disease without esophagitis: Secondary | ICD-10-CM | POA: Diagnosis not present

## 2019-12-25 DIAGNOSIS — E1152 Type 2 diabetes mellitus with diabetic peripheral angiopathy with gangrene: Secondary | ICD-10-CM | POA: Diagnosis not present

## 2019-12-30 DIAGNOSIS — K219 Gastro-esophageal reflux disease without esophagitis: Secondary | ICD-10-CM | POA: Diagnosis not present

## 2019-12-30 DIAGNOSIS — E1152 Type 2 diabetes mellitus with diabetic peripheral angiopathy with gangrene: Secondary | ICD-10-CM | POA: Diagnosis not present

## 2019-12-30 DIAGNOSIS — I10 Essential (primary) hypertension: Secondary | ICD-10-CM | POA: Diagnosis not present

## 2019-12-30 DIAGNOSIS — E785 Hyperlipidemia, unspecified: Secondary | ICD-10-CM | POA: Diagnosis not present

## 2019-12-30 DIAGNOSIS — D649 Anemia, unspecified: Secondary | ICD-10-CM | POA: Diagnosis not present

## 2019-12-30 DIAGNOSIS — Z4781 Encounter for orthopedic aftercare following surgical amputation: Secondary | ICD-10-CM | POA: Diagnosis not present

## 2020-01-01 ENCOUNTER — Other Ambulatory Visit: Payer: Self-pay

## 2020-01-01 ENCOUNTER — Encounter (HOSPITAL_BASED_OUTPATIENT_CLINIC_OR_DEPARTMENT_OTHER): Payer: Medicare Other | Attending: Internal Medicine | Admitting: Internal Medicine

## 2020-01-01 DIAGNOSIS — I10 Essential (primary) hypertension: Secondary | ICD-10-CM | POA: Insufficient documentation

## 2020-01-01 DIAGNOSIS — E1142 Type 2 diabetes mellitus with diabetic polyneuropathy: Secondary | ICD-10-CM | POA: Diagnosis not present

## 2020-01-01 DIAGNOSIS — L97528 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ity: Secondary | ICD-10-CM | POA: Diagnosis not present

## 2020-01-01 DIAGNOSIS — L97518 Non-pressure chronic ulcer of other part of right foot with other specified severity: Secondary | ICD-10-CM | POA: Diagnosis not present

## 2020-01-01 DIAGNOSIS — Z95820 Peripheral vascular angioplasty status with implants and grafts: Secondary | ICD-10-CM | POA: Insufficient documentation

## 2020-01-01 DIAGNOSIS — Z89431 Acquired absence of right foot: Secondary | ICD-10-CM | POA: Diagnosis not present

## 2020-01-01 DIAGNOSIS — L97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 limited to breakdown of skin: Secondary | ICD-10-CM | POA: Diagnosis not present

## 2020-01-01 DIAGNOSIS — E11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: Secondary | ICD-10-CM | POA: Diagnosis not present

## 2020-01-01 DIAGNOSIS — Z8631 Personal history of diabetic foot ulcer: Secondary | ICD-10-CM | POA: Diagnosis not present

## 2020-01-01 DIAGNOSIS — Z09 Encounter for follow-up examination after completed treatment for conditions other than malignant neoplasm: Secondary | ICD-10-CM | POA: Insufficient documentation

## 2020-01-01 DIAGNOSIS — E11621 Type 2 diabetes mellitus with foot ulcer: Secondary | ICD-10-CM | POA: Diagnosis not present

## 2020-01-01 DIAGNOSIS — I6529 Occlusion and stenosis of unspecified carotid artery: Secondary | ICD-10-CM | POA: Insufficient documentation

## 2020-01-01 DIAGNOSIS — E11319 Type 2 diabetes mellitus with unspecified diabetic retinopathy without macular edema: Secondary | ICD-10-CM | POA: Diagnosis not present

## 2020-01-04 NOTE — Progress Notes (Signed)
[name], [name] ([id number]) Visit Report for 01/01/2020 HPI Details Patient Name: Date of Service: [name], [name] 01/01/2020 1:45 PM Medical Record Number:[id number] Patient Account Number: [id number] Date of Birth/Sex: Treating RN: Dec 10, 1940 (79 y.o. [name] Primary Care Provider: [name] Other Clinician: Referring Provider: Treating Provider/[name]:Kimmarie Pascale, [name], [name] Weeks in Treatment: 24 History of Present Illness HPI Description: ADMISSION 07/13/2019 Patient is a 79 year old type II diabetic. She has known PAD. She has been followed by Dr. Jacqualyn Posey of podiatry for blistering areas on her toes dating back to 04/30/2019 which at this was the left fourth toe. By 8/11 she had wounds on the right and left second toes. She underwent arterial studies by Dr. Alvester Chou on 7/31 that showed ABIs on the right of 0.60 TBI of 0.26 on the left ABI of 0.56 and a TBI of 0.25. By 9/10 she had ischemic-looking wounds per Dr. Earleen Newport on the right first, left first second and third. She has been using Medihoney and then mupirocin more recently simply Betadine. The patient underwent angiography by Dr. Gwenlyn Found on 9/21. 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 Noted that there was 1 vessel runoff in the foot via the peroneal. On the left there was an 80% left SFA, 70% mid left SFA. There was a short segment calcified CTO distal less than SFA and above-knee popliteal artery reconstituting with two-vessel runoff. 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 It was felt that she had bilateral SFA disease as well as tibial vessel disease. An attempt was made to revascularize the left SFA but they were unable to cross because of the highly calcified nature of the lesion. 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 She has an area on the medial left fourth toe with raised horned callus on top of this. I am not certain what this represents. With regards to pain she has about a 2-1/2 I will claudication tolerance in the grocery store. She has some pain in night which is relieved by putting her feet down over the side of the bed. She is wearing Nitro-Dur patches on the top of her right foot. Past medical history includes type 2 diabetes with secondary PAD, neoplasm of the skin, diabetic retinopathy, carotid artery stenosis, hyperlipidemia hypertension. 11/2; the last time the patient was here I spoke to Dr. Gwenlyn Found about revascularization options on the right. As I understand things currently Dr. Gwenlyn Found spoke with Dr. Trula Slade and ultimately the patient was taken to surgery on 10/27. She had a right iliofemoral endarterectomy with a bovine patch angioplasty. I think the plan now is for her to have a staged intervention on the right SFA by Dr. Alvester Chou. Per the patient's understanding Dr. Gwenlyn Found and Dr. Jacqualyn Posey are waiting to see when the gangrenous toes on the right foot can be amputated. The patient states her pain is a lot better and she is grateful for that. 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 Small area on the left fourth toe. She is using Betadine to the mummified areas on the right and Medihoney on the left 08/27/2019; the last time I saw this patient I discharged her from the clinic. She had been revascularized by Dr. Trula Slade and she had  a right iliofemoral endarterectomy with a bovine angioplasty. 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 Jacqualyn Posey of podiatry on 08/07/2019. I note that she also had a intervention by Dr. Gwenlyn Found and he performed a directional atherectomy and drug-coated balloon angioplasty of the SFA and popliteal artery on the right. I  am not certain of the date of Dr. Kennon Holter procedure as of the time of this dictation. She was referred back to [place] by Dr. Earleen Newport predominantly for follow-up of the left fourth toe. She still has sutures and stitches in the right TMA site. She states her pain is a lot better. She expresses concern about the condition of the amputation site at the TMA. She is on doxycycline I think prescribed by Dr. Earleen Newport. She is complaining of some pain at night 12/10; I spoke to Dr. Jacqualyn Posey last week. He removed the sutures on the right foot on Monday of this week. She has the area on the left fourth toe just proximal to the PIP and then the right TMA site. She is still on doxycycline and has enough through next week. Unfortunately the TMA site does not look good at all. Both on the lateral and medial part of the incisions are areas that probe to bone. There is purulence over the medial part which I have cultured. We will use silver alginate. Left fourth toe looks somewhat better but there was still exposed bone 12/17; patient has an MRI booked for 12/30. Culture I did last week showed Pseudomonas Serratia and Enterococcus. This was purulent drainage coming out of the medial part of her amputation site. I use cefdinir 300 twice daily for 10 days that started on 12/15. Her x-ray on the right showed limited evaluation for osteomyelitis. The findings could have been postoperative. There was subtle erosion in the distal first and distal fifth metatarsal. An MRI was suggested. On the left she had irregularity of the fourth middle and proximal phalanx consistent with a history of osteomyelitis. I do not know that she has a history of osteomyelitis in this area. She had a newly defined area on the plantar third toe 12/31; patient's MRI is listed below: MRI OF THE RIGHT FOREFOOT WITHOUT AND WITH CONTRAST TECHNIQUE: Multiplanar, multisequence MR imaging of the right forefoot was performed before and after the  administration of intravenous contrast. CONTRAST: 6 mL GADAVIST IV SOLN COMPARISON: Plain films right foot 09/04/2019. FINDINGS: Bones/Joint/Cartilage The patient is status post transmetatarsal amputation as seen on the prior exam. Marrow edema and enhancement are seen in all of the distal metatarsals. In the first metatarsal, signal change extends 1.5 cm proximal to the stump and in the second metatarsal extends approximately 2 cm proximal to the stump. Edema and enhancement are seen in only the distal 0.7 cm of the third metatarsal stump and tips of the fourth and fifth metatarsals. Bone marrow signal is otherwise unremarkable. A small focus of subchondral edema is seen in the lateral talus, likely degenerative. Ligaments Intact. Muscles and Tendons No intramuscular fluid collection. Soft tissues Skin ulceration is seen off the stump of the first metatarsal. A thin fluid collection tracks deep to the wound and over the anterior metatarsals worrisome for small abscess. Intense subcutaneous edema and enhancement are seen diffusely. 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 Cellulitis about the foot. Skin ulceration over the distal first metatarsal is  identified with a thin fluid collection tracking anteriorly along the stump worrisome for abscess. Small focus of subchondral edema in the lateral talus is likely degenerative. Electronically Signed By: Inge Rise M.D. On: 09/23/2019 15:25 Patient arrives in clinic today not complaining any of any pain. 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 She does not describe claudication however her activity is very limited. 10/01/2019; since the patient was last here I have communicated with Dr. Gwenlyn Found after bypass by  Dr. Trula Slade and addressing the right superficial femoral artery he states that she is widely patent through peroneal arteries to the ankle with collaterals to the dorsalis pedis. He states he is going to talk to colleagues about the feasibility of tibial pedal access. The patient seems infectious disease later this afternoon Dr. Baxter Flattery. in preparation for this she has been off antibiotics for 1 week and I went ahead and obtain pieces of the remanence of her first metatarsal for pathology and CandS. The patient is a candidate for hyperbaric oxygen. 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 1/15; considerable improvement in the wounds on her feet. We are using silver collagen. She follows with Dr. Baxter Flattery of infectious disease and is on meropenem and daptomycin. She has been taught how to do this herself at home. Follows with Dr. Graylon Good at the end of treatment here. She has 2 wounds on the surgical TMA site 1 lateral and 1 medial the lateral 1 has regressed tremendously. The area medially still has some exposed bone although the base of this looks healthy. 1/22; 2 separate wounds on the surgical TMA site. Both of these look satisfactory. The medial area does not have any exposed bone. This is an improvement On the left side fourth toe dorsally over the proximal phalanx there is a deep punched out area that probes to bone. She has an area on the tip of the left third toe. She also tells [place] that in HBO she traumatized her left Achilles and this is left her with a superficial wound area 1/28; weekly visit along with HBO. She has 5 wounds. To our punched out areas on the original TMA site on the right. Both of these appear to have contracted. The area on the right no longer has exposed bone. She has an area on the tip of the left third toe and on the DIP of the left fourth toe. Both of these had surface callus that I removed and unfortunately they have  small areas that both go to bone. She has a traumatic wound on the left Achilles area that happened in HBO and that appears better. She is tolerating her IV antibiotics well at home. She has home health changing the dressings and she is doing it once on the weekends. We have been using silver collagen. She has been extensively revascularized on the right by Dr. Trula Slade and subsequently by Dr. Gwenlyn Found. 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 Baxter Flattery of infectious disease on 2/17. Dr. Gwenlyn Found on 2/23 and according to her her antibiotics stopped on 2/23. She is tolerating hyperbarics well. She has made a tremendous improvement in the right forefoot with only 2 smaller open wounds. Using silver collagen. On the left foot she has the area on the  tip of the left third toe and the medial part of the left fourth toe. These had exposed bone last week I did not sense any of that today 2/12; sees Dr. Graylon Good on 2/17 and Dr. Gwenlyn Found on 2/23. We are going to use Dermagraft on this today however the lateral part of her train TMA incision on the right is healed and the medial part is down so much that we just continue with the silver collagen She has wounds on the tip of her left third and the medial aspect of the left fourth. Both of these still have exposed bone I have not been able to get these to epithelialize. 2/19; she sees Dr. Gwenlyn Found on 2/23 and I have communicated with him about the left vascular supply. Looking at her angiogram from 08/03/2019 it looks as though that they could not cross the left CFA. Noted that she had one-vessel runoff bilaterally. She also apparently saw Dr. Graylon Good although I did not look up these results for preparation of this record. 2/26; Dr. Gwenlyn Found is going to do an angiogram next week on Wednesday. I think they are going to try to go at this  both retrograde and anterograde to see if anything can be done to revascularize the left lower limb. She continues to make nice progress on the remaining wound on the right medial foot on her TMA site and the toe it wounds on the left are responding nicely as well 3/12; the patient underwent an extensive and complicated revascularization on the left leg by Dr. Fletcher Anon on 3/3; she had an atherectomy on occluded left popliteal artery; anterior tibial artery followed by a balloon angioplasty. Atherectomy was also performed to the left SFA because there was still significant 50% stenosis in the left popliteal artery they performed an intravascular lithotripsy which improved the residual stenosis to 20%. The same lithotripsy was used to dilate the proximal left SFA. She had a drug-eluting stent placed I believe in the SFA. The patient returned for hyperbarics this week. She has had some eye problems on the right which she tells me are secondary to diabetic retinopathy and she saw her eye doctor. She is really made excellent progress there is no open wounds on the left foot at all. She has one open area medially and her TMA site on the right lateral wound is closed. 3/19; the patient comes in today with the area on the medial transmet on the right improved. Some debris removed from the surface revealed the still open wound. Unfortunately she now has the open area on the third toe tip and the medial aspect of the dorsal fourth toe. These are in the same location as her previous wounds. These had actually closed up last week. 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 She is undergone complex revascularizations on the right by Dr. Trula Slade and Dr. Gwenlyn Found and I believe Dr. Fletcher Anon worked on the tibial vessels on the left most recently.she had a complex transmetatarsal amputation wound with underlying osteomyelitis on presentation to the clinic and then developed deep  wounds on the plantar left third toe tip and on the medial part of the left fourth toe at the PIP. She completed IVantibiotics as directed by infectious disease and I believe a course of oral antibiotics as well.she underwent a complete 40 treatments of hyperbarics. She comes in today with the vast majority of the right TMA site healed there is a small opening on the most medial  aspect. Her left third and left fourth toes appear to have epithelialization although this has fluctuated somewhat. 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 She had a gaping wound on the right transmetatarsal amputation L as well as deep wounds on the left third and left fourth toes. She underwent complex revascularizations on her bilateral lower legs which I described on her last visit. She completed IV antibiotics by infectious disease and 40 treatments of hyperbarics. Her left third and fourth toes are healed. The area on the right transmetatarsal amputation site is also closed today. Electronic Signature(s) Signed: 01/04/2020 9:07:11 AM By: [name] MD Entered By: [name] on 01/01/2020 14:52:50 -------------------------------------------------------------------------------- Physical Exam Details Patient Name: Date of Service: [name] 01/01/2020 1:45 PM Medical Record Number:[id number] Patient Account Number: [id number] Date of Birth/Sex: Treating RN: 1941/09/01 (79 y.o. [name] Primary Care Provider: [name] Other Clinician: Referring Provider: Treating Provider/[name]:Beuna Bolding, [name], [name] Weeks in Treatment: 24 Constitutional Patient is hypertensive.. Pulse regular and within target range for patient.Marland Kitchen Respirations regular, non-labored and within target range.. Temperature is normal and within the target range for the patient.Marland Kitchen Appears in no distress. Cardiovascular She has faint pedal pulses at the dorsalis pedis. Notes Wound exam;  TMA site had one area medially that I was concerned about I looked at that with our case manager I think this is epithelialized into a small divot. I cannot see anything open here. The left third and fourth toes are both healed this was on the tip of the third and the medial part of the fourth. Electronic Signature(s) Signed: 01/04/2020 9:07:11 AM By: [name] MD Entered By: [name] on 01/01/2020 14:54:06 -------------------------------------------------------------------------------- Physician Orders Details Patient Name: Date of Service: [name] 01/01/2020 1:45 PM Medical Record Number:[id number] Patient Account Number: [id number] Date of Birth/Sex: Treating RN: 1941/06/21 (79 y.o. 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, 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 Discharge From [facility] Discharge from [place] - call if wound re-opens or new wound develops Off-Loading Open toe surgical shoe to: - for right foot, please give patient one [place] [facility] - [facility] Electronic Signature(s) Signed: 01/01/2020 6:04:52 PM By: Kela Millin Signed: 01/04/2020 9:07:11 AM By: [name] MD Entered By: Kela Millin on 01/01/2020  14:26:29 -------------------------------------------------------------------------------- Problem List Details Patient Name: Date of Service: [name] 01/01/2020 1:45 PM Medical Record Number:[id number] Patient Account Number: [id number] Date of Birth/Sex: Treating RN: 08-27-1941 (79 y.o. [name] Primary Care Provider: [name] Other Clinician: Referring Provider: Treating Provider/[name]:Richanda Darin, [name], [name] Weeks in Treatment: 24 Active Problems ICD-10 Evaluated Encounter Code Description Active Date Today Diagnosis E11.621 Type 2 diabetes mellitus with foot ulcer 07/13/2019 No Yes E11.52 Type 2 diabetes mellitus with diabetic peripheral 07/13/2019 No Yes angiopathy with gangrene L97.518 Non-pressure chronic ulcer of other part of right foot 07/13/2019 No Yes with other specified severity L97.528 Non-pressure chronic ulcer of  other part of left foot 07/13/2019 No Yes with other specified severity E11.42 Type 2 diabetes mellitus with diabetic polyneuropathy 07/13/2019 No Yes 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 [name] Primary Care Provider: [name] Other Clinician: Referring Provider: Treating Provider/[name]:Steven Veazie, [name], [name] Weeks in Treatment: 24 Subjective History of Present Illness  (HPI) ADMISSION 07/13/2019 Patient is a 79 year old type II diabetic. She has known PAD. She has been followed by Dr. Jacqualyn Posey of podiatry for blistering areas on her toes dating back to 04/30/2019 which at this was the left fourth toe. By 8/11 she had wounds on the right and left second toes. She underwent arterial studies by Dr. Alvester Chou on 7/31 that showed ABIs on the right of 0.60 TBI of 0.26 on the left ABI of 0.56 and a TBI of 0.25. By 9/10 she had ischemic-looking wounds per Dr. Earleen Newport on the right first, left first second and third. She has been using Medihoney and then mupirocin more recently simply Betadine. The patient underwent angiography by Dr. Gwenlyn Found on 9/21. 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 Noted that there was 1 vessel runoff in the foot via the peroneal. ooOn the left there was an 80% left SFA, 70% mid left SFA. There was a short segment calcified CTO distal less than SFA and above-knee popliteal artery reconstituting with two-vessel runoff. 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 It was felt that she had bilateral SFA disease as well as tibial vessel disease. An attempt was made to revascularize the left SFA but they were unable to cross because of the highly calcified nature of the lesion. 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 She has an area on the medial left fourth toe with raised horned callus on top of this. I am not certain what this represents. With regards to pain she has about a 2-1/2 I will claudication tolerance in the grocery store. She has some pain in night which is relieved by putting her feet down over the side of the bed. She is wearing Nitro-Dur patches on the top of her right foot. Past medical history  includes type 2 diabetes with secondary PAD, neoplasm of the skin, diabetic retinopathy, carotid artery stenosis, hyperlipidemia hypertension. 11/2; the last time the patient was here I spoke to Dr. Gwenlyn Found about revascularization options on the right. As I understand things currently Dr. Gwenlyn Found spoke with Dr. Trula Slade and ultimately the patient was taken to surgery on 10/27. She had a right iliofemoral endarterectomy with a bovine patch angioplasty. I think the plan now is for her to have a staged intervention on the right SFA by Dr. Alvester Chou. Per the patient's understanding Dr. Gwenlyn Found and Dr. Jacqualyn Posey are waiting to see when the gangrenous toes on the right foot can be amputated. The patient states her pain is a lot better  and she is grateful for that. 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 Small area on the left fourth toe. She is using Betadine to the mummified areas on the right and Medihoney on the left 08/27/2019; the last time I saw this patient I discharged her from the clinic. She had been revascularized by Dr. Trula Slade and she had a right iliofemoral endarterectomy with a bovine angioplasty. 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 Jacqualyn Posey of podiatry on 08/07/2019. I note that she also had a intervention by Dr. Gwenlyn Found and he performed a directional atherectomy and drug-coated balloon angioplasty of the SFA and popliteal artery on the right. I am not certain of the date of Dr. Kennon Holter procedure as of the time of this dictation. She was referred back to [place] by Dr. Earleen Newport predominantly for follow-up of the left fourth toe. She still has sutures and stitches in the right TMA site. She states her pain is a lot better. She expresses concern about the condition of the amputation site at the TMA. She is on doxycycline I think prescribed by Dr. Earleen Newport. She is complaining of some pain at night 12/10; I spoke to Dr.  Jacqualyn Posey last week. He removed the sutures on the right foot on Monday of this week. She has the area on the left fourth toe just proximal to the PIP and then the right TMA site. She is still on doxycycline and has enough through next week. Unfortunately the TMA site does not look good at all. Both on the lateral and medial part of the incisions are areas that probe to bone. There is purulence over the medial part which I have cultured. We will use silver alginate. Left fourth toe looks somewhat better but there was still exposed bone 12/17; patient has an MRI booked for 12/30. Culture I did last week showed Pseudomonas Serratia and Enterococcus. This was purulent drainage coming out of the medial part of her amputation site. I use cefdinir 300 twice daily for 10 days that started on 12/15. Her x-ray on the right showed limited evaluation for osteomyelitis. The findings could have been postoperative. There was subtle erosion in the distal first and distal fifth metatarsal. An MRI was suggested. On the left she had irregularity of the fourth middle and proximal phalanx consistent with a history of osteomyelitis. I do not know that she has a history of osteomyelitis in this area. She had a newly defined area on the plantar third toe 12/31; patient's MRI is listed below: MRI OF THE RIGHT FOREFOOT WITHOUT AND WITH CONTRAST TECHNIQUE: Multiplanar, multisequence MR imaging of the right forefoot was performed before and after the administration of intravenous contrast. CONTRAST: 6 mL GADAVIST IV SOLN COMPARISON: Plain films right foot 09/04/2019. FINDINGS: Bones/Joint/Cartilage The patient is status post transmetatarsal amputation as seen on the prior exam. Marrow edema and enhancement are seen in all of the distal metatarsals. In the first metatarsal, signal change extends 1.5 cm proximal to the stump and in the second metatarsal extends approximately 2 cm proximal to the stump. Edema and  enhancement are seen in only the distal 0.7 cm of the third metatarsal stump and tips of the fourth and fifth metatarsals. Bone marrow signal is otherwise unremarkable. A small focus of subchondral edema is seen in the lateral talus, likely degenerative. Ligaments Intact. Muscles and Tendons No intramuscular fluid collection. Soft tissues Skin ulceration is seen off the stump  of the first metatarsal. A thin fluid collection tracks deep to the wound and over the anterior metatarsals worrisome for small abscess. Intense subcutaneous edema and enhancement are seen diffusely. 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 Cellulitis about the foot. Skin ulceration over the distal first metatarsal is identified with a thin fluid collection tracking anteriorly along the stump worrisome for abscess. Small focus of subchondral edema in the lateral talus is likely degenerative. Electronically Signed By: Inge Rise M.D. On: 09/23/2019 15:25 Patient arrives in clinic today not complaining any of any pain. 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 She does not describe claudication however her activity is very limited. 10/01/2019; since the patient was last here I have communicated with Dr. Gwenlyn Found after bypass by Dr. Trula Slade and addressing the right superficial femoral artery he states that she is widely patent through peroneal arteries to the ankle with collaterals to the dorsalis pedis. He states he is going to talk to colleagues about the feasibility of tibial pedal access. The patient seems infectious disease later this afternoon Dr. Baxter Flattery. in preparation for this she has been off antibiotics for 1 week and I went ahead and obtain pieces of the remanence of her first  metatarsal for pathology and CandS. The patient is a candidate for hyperbaric oxygen. 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 1/15; considerable improvement in the wounds on her feet. We are using silver collagen. She follows with Dr. Baxter Flattery of infectious disease and is on meropenem and daptomycin. She has been taught how to do this herself at home. Follows with Dr. Graylon Good at the end of treatment here. She has 2 wounds on the surgical TMA site 1 lateral and 1 medial the lateral 1 has regressed tremendously. The area medially still has some exposed bone although the base of this looks healthy. 1/22; 2 separate wounds on the surgical TMA site. Both of these look satisfactory. The medial area does not have any exposed bone. This is an improvement On the left side fourth toe dorsally over the proximal phalanx there is a deep punched out area that probes to bone. She has an area on the tip of the left third toe. She also tells [place] that in HBO she traumatized her left Achilles and this is left her with a superficial wound area 1/28; weekly visit along with HBO. She has 5 wounds. To our punched out areas on the original TMA site on the right. Both of these appear to have contracted. The area on the right no longer has exposed bone. She has an area on the tip of the left third toe and on the DIP of the left fourth toe. Both of these had surface callus that I removed and unfortunately they have small areas that both go to bone. She has a traumatic wound on the left Achilles area that happened in HBO and that appears better. She is tolerating her IV antibiotics well at home. She has home health changing the dressings and she is doing it once on the weekends. We have been using silver collagen. She has been extensively revascularized on the right by Dr. Trula Slade and subsequently by Dr. Gwenlyn Found. 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 Baxter Flattery of infectious disease on 2/17. Dr. Gwenlyn Found on 2/23 and according to her her antibiotics stopped on 2/23. She is tolerating hyperbarics well. She has made a tremendous improvement in the right forefoot with only 2 smaller open wounds. Using silver collagen. On the left foot she has the area on the tip of the left third toe and the medial part of the left fourth toe. These had exposed bone last week I did not sense any of that today 2/12; sees Dr. Graylon Good on 2/17 and Dr. Gwenlyn Found on 2/23. We are going to use Dermagraft on this today however the lateral part of her train TMA incision on the right is healed and the medial part is down so much that we just continue with the silver collagen She has wounds on the tip of her left third and the medial aspect of the left fourth. Both of these still have exposed bone I have not been able to get these to epithelialize. 2/19; she sees Dr. Gwenlyn Found on 2/23 and I have communicated with him about the left vascular supply. Looking at her angiogram from 08/03/2019 it looks as though that they could not cross the left CFA. Noted that she had one-vessel runoff bilaterally. She also apparently saw Dr. Graylon Good although I did not look up these results for preparation of this record. 2/26; Dr. Gwenlyn Found is going to do an angiogram next week on Wednesday. I think they are going to try to go at this both retrograde and anterograde to see if anything can be done to revascularize the left lower limb. She continues to make nice progress on the remaining wound on the right medial foot on her TMA site and the toe it wounds on the left are responding nicely as well 3/12; the patient underwent an extensive and complicated revascularization on the left leg by Dr. Fletcher Anon on 3/3; she had an atherectomy on occluded left popliteal artery; anterior tibial artery followed by a balloon  angioplasty. Atherectomy was also performed to the left SFA because there was still significant 50% stenosis in the left popliteal artery they performed an intravascular lithotripsy which improved the residual stenosis to 20%. The same lithotripsy was used to dilate the proximal left SFA. She had a drug-eluting stent placed I believe in the SFA. The patient returned for hyperbarics this week. She has had some eye problems on the right which she tells me are secondary to diabetic retinopathy and she saw her eye doctor. She is really made excellent progress there is no open wounds on the left foot at all. She has one open area medially and her TMA site on the right lateral wound is closed. 3/19; the patient comes in today with the area on the medial transmet on the right improved. Some debris removed from the surface revealed the still open wound. ooUnfortunately she now has the open area on the third toe tip and the medial aspect of the dorsal fourth toe. These are in the same location as her previous wounds. These had actually closed up last week. 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 She is undergone complex revascularizations on the right by Dr. Trula Slade and Dr. Gwenlyn Found and I believe Dr. Fletcher Anon worked on the tibial vessels on the left most recently.she had a complex transmetatarsal amputation wound with underlying osteomyelitis on presentation to the clinic and then developed deep wounds on the  plantar left third toe tip and on the medial part of the left fourth toe at the PIP. She completed IVantibiotics as directed by infectious disease and I believe a course of oral antibiotics as well.she underwent a complete 40 treatments of hyperbarics. She comes in today with the vast majority of the right TMA site healed there is a small opening on the most medial aspect. Her left third and left fourth toes appear to have epithelialization although this has  fluctuated somewhat. 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 She had a gaping wound on the right transmetatarsal amputation L as well as deep wounds on the left third and left fourth toes. She underwent complex revascularizations on her bilateral lower legs which I described on her last visit. She completed IV antibiotics by infectious disease and 40 treatments of hyperbarics. Her left third and fourth toes are healed. The area on the right transmetatarsal amputation site is also closed today. Objective Constitutional Patient is hypertensive.. Pulse regular and within target range for patient.Marland Kitchen Respirations regular, non-labored and within target range.. Temperature is normal and within the target range for the patient.Marland Kitchen Appears in no distress. Vitals Time Taken: 1:56 PM, Height: 64 in, Weight: 142 lbs, BMI: 24.4, Temperature: 97.8 F, Pulse: 65 bpm, Respiratory Rate: 18 breaths/min, Blood Pressure: 152/49 mmHg, Capillary Blood Glucose: 118 mg/dl. Cardiovascular She has faint pedal pulses at the dorsalis pedis. General Notes: Wound exam; TMA site had one area medially that I was concerned about I looked at that with our case manager I think this is epithelialized into a small divot. I cannot see anything open here. The left third and fourth toes are both healed this was on the tip of the third and the medial part of the fourth. Integumentary (Hair, Skin) Wound #7 status is Healed - Epithelialized. Original cause of wound was Surgical Injury. 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 The wound measures 0cm length x 0cm width x 0cm depth; 0cm^2 area and 0cm^3 volume. There is no tunneling or undermining noted. There is a none present amount of drainage noted. The wound margin is distinct with the outline attached to the wound base. There is no granulation within the wound bed. There is no necrotic tissue within the wound bed. 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 The patient is healed bilaterally and can be discharged from the clinic. She is at high risk of recurrence. She has vascular follow-up with Dr. Gwenlyn Found in 2 or 3 months probably for repeat noninvasive studies 2. She keeps a toe cover on the left third toe which looks quite good 3. I have advised her to keep the transmetatarsal amputation site padded. She might consider continuing to use sandals in order to have them avoid any pressure on this area. We gave her a right foot healing sandal to go  along with the left. 4. Patient tells me she plays pool and is started playing this in the league again. It was delightful to hear the reasons people make the decisions they do Electronic Signature(s) Signed: 01/04/2020 9:07:11 AM By: [name] MD Entered By: [name] on 01/01/2020 14:56:16 -------------------------------------------------------------------------------- SuperBill Details Patient Name: Date of Service: [name] 01/01/2020 Medical Record Number:[id number] Patient Account Number: [id number] Date of Birth/Sex: Treating RN: [birth year] (79 y.o. [name] Primary Care Provider: [name] Other Clinician: Referring Provider:  Treating Provider/[name]:Marguita Venning, [name], [name] Weeks in Treatment: 24 Diagnosis Coding ICD-10 Codes Code Description E11.621 Type 2 diabetes mellitus with foot ulcer E11.52 Type 2 diabetes mellitus with diabetic peripheral angiopathy with gangrene L97.518 Non-pressure chronic ulcer of other part of right foot with other specified severity L97.528 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ity E11.42 Type 2 diabetes mellitus with diabetic polyneuropathy 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, 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 Facility Procedures The patient participates with Medicare or their insurance follows the Medicare Facility Guidelines: CPT4 Code Description Modifier Quantity [id number] - WOUND CARE VISIT-LEV 3 EST PT 1 Physician Procedures CPT4 Code Description: [id number] - WC PHYS LEVEL 2 - EST PT ICD-10 Diagnosis Description L97.518 Non-pressure chronic ulcer of other part of right foot w L97.528 Non-pressure chronic ulcer of other part of left foot wi E11.621 Type 2 diabetes  mellitus with foot ulcer Modifier: ith other specifi th other specifie Quantity: 1 ed severity d severity Electronic Signature(s) Signed: 01/04/2020 9:07:11 AM By: [name] MD Entered By: [name] on 01/01/2020 14:57:13

## 2020-01-06 ENCOUNTER — Ambulatory Visit: Payer: Medicare Other | Admitting: Internal Medicine

## 2020-01-06 DIAGNOSIS — I10 Essential (primary) hypertension: Secondary | ICD-10-CM | POA: Diagnosis not present

## 2020-01-06 DIAGNOSIS — K219 Gastro-esophageal reflux disease without esophagitis: Secondary | ICD-10-CM | POA: Diagnosis not present

## 2020-01-06 DIAGNOSIS — E785 Hyperlipidemia, unspecified: Secondary | ICD-10-CM | POA: Diagnosis not present

## 2020-01-06 DIAGNOSIS — E1152 Type 2 diabetes mellitus with diabetic peripheral angiopathy with gangrene: Secondary | ICD-10-CM | POA: Diagnosis not present

## 2020-01-06 DIAGNOSIS — D649 Anemia, unspecified: Secondary | ICD-10-CM | POA: Diagnosis not present

## 2020-01-06 DIAGNOSIS — Z4781 Encounter for orthopedic aftercare following surgical amputation: Secondary | ICD-10-CM | POA: Diagnosis not present

## 2020-01-06 NOTE — Progress Notes (Signed)
[name] [name] [name] ([id number]) Visit Report for 01/01/2020 Arrival Information Details Patient Name: Date of Service: [name] [name], [name] 01/01/2020 1:45 PM Medical Record Number:[id number] Patient Account Number: [id number] Date of Birth/Sex: Treating RN: 1941-01-19 (79 y.o. [name] [name] [name] Primary Care [name] [name] [name]: [name] [name] [name] Other Clinician: Referring [name] [name] [name]: Treating [name] [name] [name]/[name]:[name] [name] [name] [name] [name] [name]: 24 Visit Information History Since Last Visit Added or deleted any medications: No Patient Arrived: [name] [name] [name] Any new allergies or adverse reactions: No Arrival Time: 13:55 Had a fall or experienced change in No Accompanied By: self activities of daily living that may affect Transfer Assistance: None risk of falls: Patient Identification Verified: Yes Signs or symptoms of abuse/neglect since last No Secondary Verification Process Yes visito Completed: Hospitalized since last visit: No Patient Requires Transmission- No Implantable device outside of the clinic excluding No Based Precautions: cellular tissue based products placed in the center Patient Has Alerts: Yes since last visit: Patient Alerts: Right ABI .23 TBI Has Dressing in Place as Prescribed: Yes refused Pain Present Now: No Left ABI .60 TBI .14 Electronic Signature(s) Signed: 01/06/2020 9:19:04 AM By: Sandre Kitty Entered By: Sandre Kitty on 01/01/2020 13:56:51 -------------------------------------------------------------------------------- Clinic Level of Care Assessment Details Patient Name: Date of Service: [name] [name], [name] 01/01/2020 1:45 PM Medical Record Number:[id number] Patient Account Number: [id number] Date of Birth/Sex: Treating RN: August 28, 1941 (79 y.o. [name] [name] [name] Primary Care [name]: [name] [name] [name] Other Clinician: Referring [name]: Treating Dejon Jungman/[name]:[name] [name] [name] [name] [name] [name]: 24 Clinic Level of  Care Assessment Items TOOL 4 Quantity Score X - Use when only an EandM is performed on FOLLOW-UP visit 1 0 ASSESSMENTS - Nursing Assessment / Reassessment X - Reassessment of Co-morbidities (includes updates in patient status) 1 10 X - Reassessment of Adherence to [name] Plan 1 5 ASSESSMENTS - Wound and Skin Assessment / Reassessment X - Simple Wound Assessment / Reassessment - one wound 1 5 []  - Complex Wound Assessment / Reassessment - multiple wounds 0 []  - Dermatologic / Skin Assessment (not related to wound area) 0 ASSESSMENTS - Focused Assessment X - Circumferential Edema Measurements - multi extremities 1 5 []  - Nutritional Assessment / Counseling / Intervention 0 []  - Lower Extremity Assessment (monofilament, tuning fork, pulses) 0 []  - Peripheral Arterial Disease Assessment (using hand held doppler) 0 ASSESSMENTS - Ostomy and/or Continence Assessment and Care []  - Incontinence Assessment and Management 0 []  - Ostomy Care Assessment and Management (repouching, etc.) 0 PROCESS - Coordination of Care X - Simple Patient / Family Education for ongoing care 1 15 []  - Complex (extensive) Patient / Family Education for ongoing care 0 X - Staff obtains Programmer, systems, Records, Test Results / Process Orders 1 10 []  - Staff telephones HHA, Nursing Homes / Clarify orders / etc 0 []  - Routine Transfer to another Facility (non-emergent condition) 0 []  - Routine Hospital Admission (non-emergent condition) 0 []  - New Admissions / Biomedical engineer / Ordering NPWT, Apligraf, etc. 0 []  - Emergency Hospital Admission (emergent condition) 0 X - Simple Discharge Coordination 1 10 []  - Complex (extensive) Discharge Coordination 0 PROCESS - Special Needs []  - Pediatric / Minor Patient Management 0 []  - Isolation Patient Management 0 []  - Hearing / Language / Visual special needs 0 []  - Assessment of Community assistance (transportation, D/C planning, etc.) 0 []  - Additional assistance /  Altered mentation 0 []  - Support Surface(s) Assessment (bed, cushion, seat, etc.) 0 INTERVENTIONS - Wound Cleansing / Measurement X - Simple Wound Cleansing - one wound 1 5 []  -  Complex Wound Cleansing - multiple wounds 0 X - Wound Imaging (photographs - any number of wounds) 1 5 []  - Wound Tracing (instead of photographs) 0 X - Simple Wound Measurement - one wound 1 5 []  - Complex Wound Measurement - multiple wounds 0 INTERVENTIONS - Wound Dressings []  - Small Wound Dressing one or multiple wounds 0 []  - Medium Wound Dressing one or multiple wounds 0 []  - Large Wound Dressing one or multiple wounds 0 []  - Application of Medications - topical 0 []  - Application of Medications - injection 0 INTERVENTIONS - Miscellaneous []  - External ear exam 0 []  - Specimen Collection (cultures, biopsies, blood, body fluids, etc.) 0 []  - Specimen(s) / Culture(s) sent or taken to Lab for analysis 0 []  - Patient Transfer (multiple staff / Civil Service fast streamer / Similar devices) 0 []  - Simple Staple / Suture removal (25 or less) 0 []  - Complex Staple / Suture removal (26 or more) 0 []  - Hypo / Hyperglycemic Management (close monitor of Blood Glucose) 0 []  - Ankle / Brachial Index (ABI) - do not check if billed separately 0 X - Vital Signs 1 5 Has the patient been seen at the hospital within the last three years: Yes Total Score: 80 Level Of Care: New/Established - Level 3 Electronic Signature(s) Signed: 01/01/2020 6:04:52 PM By: Kela Millin Entered By: Kela Millin on 01/01/2020 14:24:12 -------------------------------------------------------------------------------- Encounter Discharge Information Details Patient Name: Date of Service: [name] [name] [name] 01/01/2020 1:45 PM Medical Record Number:[id number] Patient Account Number: [id number] Date of Birth/Sex: Treating RN: October 04, 1940 (79 y.o. [name] Primary Care Nike Southwell: [name] [name] [name] Other Clinician: Referring Julita Ozbun: Treating  Brenson Hartman/[name]:[name] [name] [name] [name] [name] [name]: 24 Encounter Discharge Information Items Discharge Condition: Stable Ambulatory Status: Cane Discharge Destination: Home Transportation: Private Auto Accompanied By: self Schedule Follow-up Appointment: No Clinical Summary of Care: Electronic Signature(s) Signed: 01/01/2020 5:55:58 PM By: Deon Pilling Entered By: Deon Pilling on 01/01/2020 15:28:06 -------------------------------------------------------------------------------- Lower Extremity Assessment Details Patient Name: Date of Service: [name] [name], [name] 01/01/2020 1:45 PM Medical Record Number:[id number] Patient Account Number: [id number] Date of Birth/Sex: Treating RN: 06/12/1941 (79 y.o. [name] Primary Care Krissi Willaims: [name] [name] [name] Other Clinician: Referring Clebert Wenger: Treating Auron Tadros/[name]:[name] [name] [name] [name] [name] [name]: 24 Edema Assessment Assessed: [Left: No] [Right: No] Edema: [Left: No] [Right: No] Calf Left: Right: Point of Measurement: 39 cm From Medial Instep 30 cm 28 cm Ankle Left: Right: Point of Measurement: 10 cm From Medial Instep 20 cm 18 cm Vascular Assessment Pulses: Dorsalis Pedis Palpable: [Right:Yes] Electronic Signature(s) Signed: 01/04/2020 6:12:10 PM By: [name] RN, BSN Entered By: [name] on 01/01/2020 14:08:22 -------------------------------------------------------------------------------- Multi Wound Chart Details Patient Name: Date of Service: [name] [name] [name] 01/01/2020 1:45 PM Medical Record Number:[id number] Patient Account Number: [id number] Date of Birth/Sex: Treating RN: 1941/09/19 (79 y.o. 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] [N/A:N/A] Wounding Event: [7:Surgical Injury] [N/A:N/A] 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] Comorbid History: [7:Deep Vein Thrombosis, Hypertension, Peripheral Arterial Disease, Type II Diabetes] [N/A:N/A] Date Acquired: [7:08/07/2019] [N/A:N/A] [name] of [name]: [7:18] [N/A:N/A] Wound Status: [7:Healed - Epithelialized] [N/A:N/A] Measurements L x W x D 0x0x0 [N/A:N/A] (cm) Area (cm) : [7:0] [N/A:N/A] Volume (cm) : [  7:0] [N/A:N/A] % Reduction in Area: [7:100.00%] [N/A:N/A] % Reduction in Volume: 100.00% [N/A:N/A] Classification: [7:Grade 4] [N/A:N/A] Exudate Amount: [7:None Present] [N/A:N/A] Wound Margin: [7:Distinct, outline attached N/A] Granulation Amount: [7:None Present (0%)] [N/A:N/A] Necrotic Amount: [7:None Present (0%)] [N/A:N/A] Exposed Structures: [7:Fascia: No Fat Layer (Subcutaneous Tissue) Exposed: No Tendon: No Muscle: No Joint: No Bone: No Large (67-100%)] [N/A:N/A N/A] [name] Notes Electronic Signature(s) Signed: 01/01/2020 6:04:52 PM By: Kela Millin Signed: 01/04/2020 9:07:11 AM By: [name] MD Entered By: [name] on 01/01/2020 14:51:14 -------------------------------------------------------------------------------- Multi-Disciplinary Care Plan Details Patient Name: Date of Service: [name] [name] [name] 01/01/2020 1:45 PM Medical Record Number:[id number] Patient Account Number: [id number] Date of Birth/Sex: Treating RN: Apr 28, 1941 (79 y.o. [name] [name] [name] Primary Care Kmarion Rawl: [name] [name] [name] Other Clinician: Referring Leone Mobley: Treating Krishika Bugge/[name]:[name] [name] [name] [name] [name] [name]: 29 Active Inactive Electronic Signature(s) Signed: 01/01/2020  6:04:52 PM By: Kela Millin Entered By: Kela Millin on 01/01/2020 14:18:56 -------------------------------------------------------------------------------- Pain Assessment Details Patient Name: Date of Service: [name] [name], [name] 01/01/2020 1:45 PM Medical Record Number:[id number] Patient Account Number: [id number] Date of Birth/Sex: Treating RN: 02-Jan-1941 (79 y.o. [name] [name] [name] Primary Care Soniya Ashraf: [name] [name] [name] Other Clinician: Referring Lesslie Mckeehan: Treating Azarian Starace/[name]:[name] [name] [name] [name] [name] [name]: 24 Active Problems Location of Pain Severity and Description of Pain Patient Has Paino No Site Locations Pain Management and Medication Current Pain Management: Electronic Signature(s) Signed: 01/01/2020 6:04:52 PM By: Kela Millin Signed: 01/06/2020 9:19:04 AM By: Sandre Kitty Entered By: Sandre Kitty on 01/01/2020 13:57:30 -------------------------------------------------------------------------------- Wound Assessment Details Patient Name: Date of Service: [name] [name] [name] 01/01/2020 1:45 PM Medical Record Number:[id number] Patient Account Number: [id number] Date of Birth/Sex: Treating RN: [birth year] (79 y.o. 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 Wound Healed - Epithelialized Status: Wounding Event: Surgical Injury Comorbid Deep Vein Thrombosis, Hypertension, Date Acquired: 08/07/2019 History: Peripheral Arterial Disease, Type II Diabetes [name] Of [name]: 18 Clustered Wound: No Wound Measurements Length: (cm) 0 % Reduction Width: (cm) 0 % Reduction Depth: (cm) 0 Epitheliali Area: (cm) 0 Tunneling: Volume: (cm) 0  Underminin Wound Description Classification: Grade 4 Foul Odor A Wound Margin: Distinct, outline attached Slough/Fibr Exudate Amount: None Present Wound Bed Granulation Amount: None Present (0%) Necrotic Amount: None Present (0%) Fascia Expo Fat Layer ( Tendon Expo Muscle Expo Joint Expos Bone Expose fter Cleansing: No ino No Exposed Structure sed: No Subcutaneous Tissue) Exposed: No sed: No sed: No ed: No d: No in Area: 100% in Volume: 100% zation: Large (67-100%) No g: No Electronic Signature(s) Signed: 01/01/2020 6:04:52 PM By: Kela Millin Entered By: Kela Millin on 01/01/2020 14:18:37 -------------------------------------------------------------------------------- Vitals Details Patient Name: Date of Service: [name] [name] [name] 01/01/2020 1:45 PM Medical Record Number:[id number] Patient Account Number: [id number] Date of Birth/Sex: Treating RN: [birth year] (79 y.o. [name] [name] [name] Primary Care Regina Ganci: [name] [name] [name] Other Clinician: Referring Tyrhonda Georgiades: Treating Sherell Christoffel/[name]:[name] [name] [name] [name] [name] [name]: 24 Vital Signs Time Taken: 13:56 Temperature (F): 97.8 Height (in): 64 Pulse (bpm): 65 Weight (lbs): 142 Respiratory Rate (breaths/min): 18 Body Mass Index (BMI): 24.4 Blood Pressure (mmHg): 152/49 Capillary Blood Glucose (mg/dl): 118 Reference Range: 80 - 120 mg / dl Electronic Signature(s) Signed: 01/06/2020 9:19:04 AM By: Sandre Kitty Entered By: Sandre Kitty on 01/01/2020 13:57:14

## 2020-01-06 NOTE — Progress Notes (Signed)
ARNETT, GALINDEZ ([id number]) Visit Report for 12/18/2019 Arrival Information Details Patient Name: Date of Service: [name], [name] 12/18/2019 2:45 PM Medical Record Number:[id number] Patient Account Number: [id number] Date of Birth/Sex: Treating RN: May 05, 1941 (79 y.o. [name] Primary Care Rohn Fritsch: [name] Other Clinician: Referring Leisel Pinette: Treating Kasidy Gianino/[name]:[name], [name], [name] Weeks in Treatment: 22 Visit Information History Since Last Visit Added or deleted any medications: No Patient Arrived: Cane Any new allergies or adverse reactions: No Arrival Time: 15:59 Had a fall or experienced change in No Accompanied By: self activities of daily living that may affect Transfer Assistance: None risk of falls: Patient Identification Verified: Yes Signs or symptoms of abuse/neglect since last No Secondary Verification Process Yes visito Completed: Hospitalized since last visit: No Patient Requires Transmission- No Implantable device outside of the clinic excluding No Based Precautions: cellular tissue based products placed in the center Patient Has Alerts: Yes since last visit: Patient Alerts: Right ABI .73 TBI Has Dressing in Place as Prescribed: Yes refused Pain Present Now: No Left ABI .60 TBI .14 Electronic Signature(s) Signed: 01/06/2020 9:21:08 AM By: Sandre Kitty Entered By: Sandre Kitty on 12/18/2019 16:00:02 -------------------------------------------------------------------------------- Clinic Level of Care Assessment Details Patient Name: Date of Service: [name], [name] 12/18/2019 2:45 PM Medical Record Number:[id number] Patient Account Number: [id number] Date of Birth/Sex: Treating RN: 10-14-1940 (79 y.o. [name] Primary Care Biagio Snelson: [name] Other Clinician: Referring Myana Schlup: Treating Toy Eisemann/[name]:[name], [name], [name] Weeks in Treatment: 22 Clinic Level  of Care Assessment Items TOOL 4 Quantity Score X - Use when only an EandM is performed on FOLLOW-UP visit 1 0 ASSESSMENTS - Nursing Assessment / Reassessment X - Reassessment of Co-morbidities (includes updates in patient status) 1 10 X - Reassessment of Adherence to Treatment Plan 1 5 ASSESSMENTS - Wound and Skin Assessment / Reassessment []  - Simple Wound Assessment / Reassessment - one wound 0 X - Complex Wound Assessment / Reassessment - multiple wounds 3 5 []  - Dermatologic / Skin Assessment (not related to wound area) 0 ASSESSMENTS - Focused Assessment X - Circumferential Edema Measurements - multi extremities 2 5 []  - Nutritional Assessment / Counseling / Intervention 0 []  - Lower Extremity Assessment (monofilament, tuning fork, pulses) 0 []  - Peripheral Arterial Disease Assessment (using hand held doppler) 0 ASSESSMENTS - Ostomy and/or Continence Assessment and Care []  - Incontinence Assessment and Management 0 []  - Ostomy Care Assessment and Management (repouching, etc.) 0 PROCESS - Coordination of Care X - Simple Patient / Family Education for ongoing care 1 15 []  - Complex (extensive) Patient / Family Education for ongoing care 0 X - Staff obtains Programmer, systems, Records, Test Results / Process Orders 1 10 X - Staff telephones HHA, Nursing Homes / Clarify orders / etc 1 10 []  - Routine Transfer to another Facility (non-emergent condition) 0 []  - Routine Hospital Admission (non-emergent condition) 0 []  - New Admissions / Biomedical engineer / Ordering NPWT, Apligraf, etc. 0 []  - Emergency Hospital Admission (emergent condition) 0 X - Simple Discharge Coordination 1 10 []  - Complex (extensive) Discharge Coordination 0 PROCESS - Special Needs []  - Pediatric / Minor Patient Management 0 []  - Isolation Patient Management 0 []  - Hearing / Language / Visual special needs 0 []  - Assessment of Community assistance (transportation, D/C planning, etc.) 0 []  - Additional assistance /  Altered mentation 0 []  - Support Surface(s) Assessment (bed, cushion, seat, etc.) 0 INTERVENTIONS - Wound Cleansing / Measurement []  - Simple Wound Cleansing - one wound 0 X -  Complex Wound Cleansing - multiple wounds 3 5 X - Wound Imaging (photographs - any number of wounds) 1 5 []  - Wound Tracing (instead of photographs) 0 []  - Simple Wound Measurement - one wound 0 X - Complex Wound Measurement - multiple wounds 3 5 INTERVENTIONS - Wound Dressings X - Small Wound Dressing one or multiple wounds 1 10 []  - Medium Wound Dressing one or multiple wounds 0 []  - Large Wound Dressing one or multiple wounds 0 X - Application of Medications - topical 1 5 []  - Application of Medications - injection 0 INTERVENTIONS - Miscellaneous []  - External ear exam 0 []  - Specimen Collection (cultures, biopsies, blood, body fluids, etc.) 0 []  - Specimen(s) / Culture(s) sent or taken to Lab for analysis 0 []  - Patient Transfer (multiple staff / Civil Service fast streamer / Similar devices) 0 []  - Simple Staple / Suture removal (25 or less) 0 []  - Complex Staple / Suture removal (26 or more) 0 []  - Hypo / Hyperglycemic Management (close monitor of Blood Glucose) 0 []  - Ankle / Brachial Index (ABI) - do not check if billed separately 0 X - Vital Signs 1 5 Has the patient been seen at the hospital within the last three years: Yes Total Score: 140 Level Of Care: New/Established - Level 4 Electronic Signature(s) Signed: 12/18/2019 5:41:24 PM By: Kela Millin Entered By: Kela Millin on 12/18/2019 16:46:23 -------------------------------------------------------------------------------- Encounter Discharge Information Details Patient Name: Date of Service: [name] 12/18/2019 2:45 PM Medical Record Number:[id number] Patient Account Number: [id number] Date of Birth/Sex: Treating RN: 18-Aug-1941 (79 y.o. [name] Primary Care Merlina Marchena: [name] Other Clinician: Referring Dian Laprade: Treating  Jamorion Gomillion/[name]:[name], [name], [name] Weeks in Treatment: 22 Encounter Discharge Information Items Discharge Condition: Stable Ambulatory Status: Walker Discharge Destination: Home Transportation: Private Auto Accompanied By: self Schedule Follow-up Appointment: Yes Clinical Summary of Care: Electronic Signature(s) Signed: 12/18/2019 5:42:35 PM By: Deon Pilling Entered By: Deon Pilling on 12/18/2019 17:39:47 -------------------------------------------------------------------------------- Lower Extremity Assessment Details Patient Name: Date of Service: LEANDREA, ACKLEY 12/18/2019 2:45 PM Medical Record Number:[id number] Patient Account Number: [id number] Date of Birth/Sex: Treating RN: [birth year] (79 y.o. [name] Primary Care Desere Gwin: [name] Other Clinician: Referring Krystian Younglove: Treating Merve Hotard/[name]:[name], [name], [name] Weeks in Treatment: 22 Edema Assessment Assessed: [Left: No] [Right: No] Edema: [Left: No] [Right: No] Calf Left: Right: Point of Measurement: 39 cm From Medial Instep 30 cm 28 cm Ankle Left: Right: Point of Measurement: 10 cm From Medial Instep 20 cm 18 cm Vascular Assessment Pulses: Dorsalis Pedis Palpable: [Left:Yes] [Right:Yes] Electronic Signature(s) Signed: 12/18/2019 5:34:10 PM By: Baruch Gouty RN, BSN Entered By: Baruch Gouty on 12/18/2019 16:05:04 -------------------------------------------------------------------------------- Multi Wound Chart Details Patient Name: Date of Service: [name] 12/18/2019 2:45 PM Medical Record Number:[id number] Patient Account Number: [id number] Date of Birth/Sex: Treating RN: 09-Aug-1941 (79 y.o. [name] Primary Care Titus [name]: [name] Other Clinician: Referring Ryheem Jay: Treating Nikita Humble/[name]:[name], [name], [name] Weeks in Treatment: 22 Vital Signs Height(in): 64 Capillary Blood  111 Glucose(mg/dl): Weight(lbs): 142 Pulse(bpm): 3 Body Mass Index(BMI): 24 Blood Pressure(mmHg): 121/40 Temperature(F): 98.2 Respiratory 18 Rate(breaths/min): Photos: [1R:No Photos] [7:No Photos] [8R:No Photos] Wound Location: [1R:Left Toe Fourth - Dorsal] [7:Right Amputation Site - Transmetatarsal] [8R:Left Toe Third] Wounding Event: [1R:Blister] [7:Surgical Injury] [8R:Gradually Appeared] 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] [7:Lower Extremity] [8R:Lower Extremity] 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 N/A] [8R:N/A] Comorbid History: [1R:Deep Vein Thrombosis, Hypertension, Peripheral Hypertension, Peripheral Hypertension, Peripheral  Arterial Disease, Type II Diabetes] [7:Deep Vein Thrombosis, Arterial Disease, Type II Arterial Disease, Type II Diabetes] [8R:Deep  Vein Thrombosis, Diabetes] Date Acquired: [1R:09/16/2018] [7:08/07/2019] [8R:09/10/2019] Weeks of Treatment: [1R:22] [7:16] [8R:14] Wound Status: [1R:Healed - Epithelialized] [7:Open] [8R:Healed - Epithelialized] Wound Recurrence: [1R:Yes] [7:No] [8R:Yes] Measurements L x W x D 0x0x0 [7:0.2x0.7x0.1] [8R:0x0x0] (cm) Area (cm) : [1R:0] [7:0.11] [8R:0] Volume (cm) : [1R:0] [7:0.011] [8R:0] % Reduction in Area: [1R:100.00%] [7:98.90%] [8R:100.00%] % Reduction in Volume: 100.00% [7:98.90%] [8R:100.00%] Classification: [1R:Grade 2] [7:Grade 4] [8R:Grade 2] Exudate Amount: [1R:None Present] [7:Small] [8R:None Present] Exudate Type: [1R:N/A] [7:Serosanguineous] [8R:N/A] Exudate Color: [1R:N/A] [7:red, brown] [8R:N/A] Wound Margin: [1R:Distinct, outline attached Well defined, not attached Distinct, outline attached] Granulation Amount: [1R:None Present (0%)] [7:Large (67-100%)] [8R:None Present (0%)] Granulation Quality: [1R:N/A] [7:Red] [8R:N/A] Necrotic Amount: [1R:None Present (0%)] [7:None Present (0%)] [8R:None Present  (0%)] Exposed Structures: [1R:Fascia: No Fat Layer (Subcutaneous Tissue) Exposed: No Tendon: No Muscle: No Joint: No Bone: No Large (67-100%)] [7:Fat Layer (Subcutaneous Tissue) Exposed: Yes Fascia: No Tendon: No Muscle: No Joint: No Bone: No Small (1-33%)] [8R:Fat Layer  (Subcutaneous Tissue) Exposed: Yes Fascia: No Tendon: No Muscle: No Joint: No Bone: No Large (67-100%)] Treatment Notes 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 Cleanse With Wound Cleanser 3. Primary Dressing Applied Collegen AG Hydrogel or K-Y Jelly 4. Secondary Dressing ABD Pad Roll Gauze 5. Secured With Medipore tape 7. Footwear/Offloading device applied Removable Cast Scientific laboratory technician) Signed: 12/19/2019 8:02:09 AM By: [name] MD Signed: 12/21/2019 5:06:29 PM By: Kela Millin Entered By: [name] on 12/19/2019 07:49:26 -------------------------------------------------------------------------------- Multi-Disciplinary Care Plan Details Patient Name: Date of Service: DIEDRA, SINOR 12/18/2019 2:45 PM Medical Record Number:[id number] Patient Account Number: [id number] Date of Birth/Sex: Treating RN: [birth year] (79 y.o. [name] Primary Care Nolawi Kanady: [name] Other Clinician: Referring Vivica Dobosz: Treating Kaileia Flow/[name]:[name], [name], [name] Weeks in Treatment: 69 Active Inactive Abuse / Safety / Falls / Self Care Management Nursing Diagnoses: Potential for falls Goals: Patient will remain injury free related to falls Date Initiated: 08/27/2019 Target Resolution Date: 01/08/2020 Goal Status: Active Interventions: Provide education on fall prevention Notes: Pain, Acute or Chronic Nursing Diagnoses: Pain, acute or chronic: actual or potential Potential alteration in comfort, pain Goals: Patient will verbalize adequate pain control and receive pain control interventions during procedures as needed Date  Initiated: 08/27/2019 Target Resolution Date: 01/08/2020 Goal Status: Active Interventions: Encourage patient to take pain medications as prescribed Provide education on pain management Reposition patient for comfort Notes: Electronic Signature(s) Signed: 12/18/2019 5:41:24 PM By: Kela Millin Entered By: Kela Millin on 12/18/2019 16:45:13 -------------------------------------------------------------------------------- Pain Assessment Details Patient Name: Date of Service: [name], [name] 12/18/2019 2:45 PM Medical Record Number:[id number] Patient Account Number: [id number] Date of Birth/Sex: Treating RN: 10-09-1940 (79 y.o. [name] Primary Care Candence Sease: [name] Other Clinician: Referring Dail Meece: Treating Ashwin Tibbs/[name]:[name], [name], [name] Weeks in Treatment: 22 Active Problems Location of Pain Severity and Description of Pain Patient Has Paino No Site Locations Pain Management and Medication Current Pain Management: Electronic Signature(s) Signed: 12/18/2019 5:41:24 PM By: Kela Millin Signed: 01/06/2020 9:21:08 AM By: Sandre Kitty Entered By: Sandre Kitty on 12/18/2019 16:01:54 -------------------------------------------------------------------------------- Patient/Caregiver Education Details Patient Name: Date of Service: [name] 3/26/2021andnbsp2:45 PM Medical Record Number:[id number] Patient Account Number: [id number] Date of Birth/Gender: Treating RN: 17-Feb-1941 (79 y.o. [name] Primary Care Physician: [name] Other Clinician: Referring Physician: Treating Physician/[name]:[name], [name], [name] in Treatment: 22 Education Assessment Education Provided To: Patient Education  Topics Provided Pain: Methods: Explain/Verbal Responses: State content correctly Safety: Methods: Explain/Verbal Responses: State content correctly Electronic  Signature(s) Signed: 12/18/2019 5:41:24 PM By: Kela Millin Entered By: Kela Millin on 12/18/2019 16:45:31 -------------------------------------------------------------------------------- Wound Assessment Details Patient Name: Date of Service: [name], [name] 12/18/2019 2:45 PM Medical Record Number:[id number] Patient Account Number: [id number] Date of Birth/Sex: Treating RN: Oct 31, 1940 (79 y.o. [name] Primary Care Marcio Hoque: [name] Other Clinician: Referring Madox Corkins: Treating Yuchen Fedor/[name]:[name], [name], [name] Weeks in Treatment: 22 Wound Status Wound Number: 1R Primary Diabetic Wound/Ulcer of the Lower Extremity Etiology: Wound Location: Left Toe Fourth - Dorsal Secondary Arterial Insufficiency Ulcer Wounding Event: Blister Etiology: Date Acquired: 09/16/2018 Wound Healed - Epithelialized Weeks Of Treatment: 22 Status: Clustered Wound: No Comorbid Deep Vein Thrombosis, Hypertension, History: Peripheral Arterial Disease, Type II Diabetes Photos Photo Uploaded By: [name] on 12/25/2019 16:40:51 Wound Measurements Length: (cm) 0 % Reduction Width: (cm) 0 % Reduction Depth: (cm) 0 Epithelializ Area: (cm) 0 Tunneling: Volume: (cm) 0 Undermining Wound Description Classification: Grade 2 Foul Odor A Wound Margin: Distinct, outline attached Slough/Fibr Exudate Amount: None Present Wound Bed Granulation Amount: None Present (0%) Necrotic Amount: None Present (0%) Fascia Expo Fat Layer ( Tendon Expo Muscle Expo Joint Expos Bone Expose fter Cleansing: No ino No Exposed Structure sed: No Subcutaneous Tissue) Exposed: No sed: No sed: No ed: No d: No in Area: 100% in Volume: 100% ation: Large (67-100%) No : No Electronic Signature(s) Signed: 12/18/2019 5:41:24 PM By: Kela Millin Entered By: Kela Millin on 12/18/2019  16:41:59 -------------------------------------------------------------------------------- Wound Assessment Details Patient Name: Date of Service: [name] 12/18/2019 2:45 PM Medical Record Number:[id number] Patient Account Number: [id number] Date of Birth/Sex: Treating RN: 19-Nov-1940 (79 y.o. [name] Primary Care Tallia Moehring: [name] Other Clinician: Referring Yaslene Lindamood: Treating Samirah Scarpati/[name]:[name], [name], [name] Weeks in Treatment: 22 Wound Status Wound Number: 7 Primary Diabetic Wound/Ulcer of the Lower Extremity Etiology: Wound Location: Right Amputation Site - Transmetatarsal Wound Open Status: Wounding Event: Surgical Injury Comorbid Deep Vein Thrombosis, Hypertension, Date Acquired: 08/07/2019 History: Peripheral Arterial Disease, Type II Diabetes Weeks Of Treatment: 16 Clustered Wound: No Photos Photo Uploaded By: [name] on 12/25/2019 16:41:17 Wound Measurements Length: (cm) 0.2 Width: (cm) 0.7 Depth: (cm) 0.1 Area: (cm) 0.11 Volume: (cm) 0.011 Wound Description Classification: Grade 4 Wound Margin: Well defined, not attached Exudate Amount: Small Exudate Type: Serosanguineous Exudate Color: red, brown Wound Bed Granulation Amount: Large (67-100%) Granulation Quality: Red Necrotic Amount: None Present (0%) fter Cleansing: No ino No Exposed Structure sed: No Subcutaneous Tissue) Exposed: Yes sed: No sed: No ed: No d: No % Reduction in Area: 98.9% % Reduction in Volume: 98.9% Epithelialization: Small (1-33%) Tunneling: No Undermining: No Foul Odor A Slough/Fibr Fascia Expo Fat Layer ( Tendon Expo Muscle Expo Joint Expos Bone Expose Electronic Signature(s) Signed: 12/18/2019 5:41:24 PM By: Kela Millin Entered By: Kela Millin on 12/18/2019 16:42:08 -------------------------------------------------------------------------------- Wound Assessment Details Patient Name: Date of  Service: [name] 12/18/2019 2:45 PM Medical Record Number:[id number] Patient Account Number: [id number] Date of Birth/Sex: Treating RN: Jun 12, 1941 (79 y.o. [name] Primary Care Rosalene Wardrop: [name] Other Clinician: Referring Kyng Matlock: Treating Ayomide Purdy/[name]:[name], [name], [name] Weeks in Treatment: 22 Wound Status Wound Number: 8R Primary Diabetic Wound/Ulcer of the Lower Extremity Etiology: Wound Location: Left Toe Third Wound Healed - Epithelialized Wounding Event: Gradually Appeared Status: Date Acquired: 09/10/2019 Comorbid Deep Vein Thrombosis, Hypertension, Weeks Of Treatment: 14 History: Peripheral Arterial Disease, Type II Diabetes Clustered Wound: No Photos Photo  Uploaded By: [name] on 12/25/2019 16:40:51 Wound Measurements Length: (cm) 0 % Reduction Width: (cm) 0 % Reduction Depth: (cm) 0 Epithelializ Area: (cm) 0 Tunneling: Volume: (cm) 0 Undermining Wound Description Classification: Grade 2 Foul Odor A Wound Margin: Distinct, outline attached Slough/Fibr Exudate Amount: None Present Wound Bed Granulation Amount: None Present (0%) Necrotic Amount: None Present (0%) Fascia Expo Fat Layer ( Tendon Expo Muscle Expo Joint Expos Bone Expose fter Cleansing: No ino No Exposed Structure sed: No Subcutaneous Tissue) Exposed: Yes sed: No sed: No ed: No d: No in Area: 100% in Volume: 100% ation: Large (67-100%) No : No Electronic Signature(s) Signed: 12/18/2019 5:41:24 PM By: Kela Millin Entered By: Kela Millin on 12/18/2019 16:42:14 -------------------------------------------------------------------------------- Vitals Details Patient Name: Date of Service: [name] 12/18/2019 2:45 PM Medical Record Number:[id number] Patient Account Number: [id number] Date of Birth/Sex: Treating RN: 29-Nov-1940 (79 y.o. [name] Primary Care Shanon Seawright: [name] Other  Clinician: Referring Chee Dimon: Treating Oneita Allmon/[name]:[name], [name], [name] Weeks in Treatment: 22 Vital Signs Time Taken: 16:00 Temperature (F): 98.2 Height (in): 64 Pulse (bpm): 59 Weight (lbs): 142 Respiratory Rate (breaths/min): 18 Body Mass Index (BMI): 24.4 Blood Pressure (mmHg): 121/40 Capillary Blood Glucose (mg/dl): 111 Reference Range: 80 - 120 mg / dl Electronic Signature(s) Signed: 01/06/2020 9:21:08 AM By: Sandre Kitty Entered By: Sandre Kitty on 12/18/2019 16:01:49

## 2020-01-08 ENCOUNTER — Telehealth: Payer: Self-pay

## 2020-01-08 NOTE — Telephone Encounter (Signed)
COVID-19 Pre-Screening Questions:01/08/20  Do you currently have a fever (>100 F), chills or unexplained body aches? NO  Are you currently experiencing new cough, shortness of breath, sore throat, runny nose? NO  .  Have you recently travelled outside the state of [place] in the last 14 days? NO  .  Have you been in contact with someone that is currently pending confirmation of Covid19 testing or has been confirmed to have the Covid19 virus?  NO  **If the patient answers NO to ALL questions -  advise the patient to please call the clinic before coming to the office should any symptoms develop.     

## 2020-01-11 ENCOUNTER — Other Ambulatory Visit: Payer: Self-pay

## 2020-01-11 ENCOUNTER — Ambulatory Visit (INDEPENDENT_AMBULATORY_CARE_PROVIDER_SITE_OTHER): Payer: Medicare Other | Admitting: Internal Medicine

## 2020-01-11 ENCOUNTER — Encounter: Payer: Self-pay | Admitting: Internal Medicine

## 2020-01-11 VITALS — BP 142/58 | HR 54 | Temp 97.6°F | Ht 64.5 in | Wt 141.0 lb

## 2020-01-11 DIAGNOSIS — I6523 Occlusion and stenosis of bilateral carotid arteries: Secondary | ICD-10-CM | POA: Diagnosis not present

## 2020-01-11 DIAGNOSIS — E11621 Type 2 diabetes mellitus with foot ulcer: Secondary | ICD-10-CM

## 2020-01-11 DIAGNOSIS — L97509 Non-pressure chronic ulcer of other part of unspecified foot with unspecified severity: Secondary | ICD-10-CM | POA: Diagnosis not present

## 2020-01-11 DIAGNOSIS — E1169 Type 2 diabetes mellitus with other specified complication: Secondary | ICD-10-CM | POA: Diagnosis not present

## 2020-01-11 DIAGNOSIS — M869 Osteomyelitis, unspecified: Secondary | ICD-10-CM

## 2020-01-11 NOTE — Progress Notes (Signed)
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   MRN: [id number]  HPI 79yo F had to receive IV therapy for wound dehiscence associated with right foot TM amputation. She had received iv therapy finished up mid march. Well healed through HBO and wound care with dr Dellia Nims. She was released from care this past week since doing so well. Right foot is well healed. No drainage or erythema  She has had covid vaccine  Outpatient Encounter Medications as of 01/11/2020  Medication Sig  . acetaminophen (TYLENOL) 500 MG tablet Take 1,000 mg by mouth every 6 (six) hours as needed for moderate pain or headache.  . Ascorbic Acid (VITAMIN C) 1000 MG tablet Take 1,000 mg by mouth daily with supper.   Marland Kitchen aspirin EC 81 MG tablet Take 81 mg by mouth every evening.  Marland Kitchen atorvastatin (LIPITOR) 20 MG tablet Take 1 tablet by mouth once daily  . calcium carbonate (TUMS - DOSED IN MG ELEMENTAL CALCIUM) 500 MG chewable tablet Chew 2 tablets by mouth daily as needed for indigestion or heartburn.  Marland Kitchen CINNAMON PO Take 1,000 mg by mouth in the morning, at noon, and at bedtime. Breakfast, Supper, & bedtime  . clopidogrel (PLAVIX) 75 MG tablet Take 1 tablet by mouth once daily (Patient taking differently: Take 75 mg by mouth daily. )  . diltiazem (CARTIA XT) 240 MG 24 hr capsule Take 1 capsule (240 mg total) by mouth daily.  Marland Kitchen glipiZIDE (GLUCOTROL XL) 5 MG 24 hr tablet Take 1 tablet by mouth once daily with breakfast (Patient taking differently: Take 5 mg by mouth daily with breakfast. )  . hydrochlorothiazide (HYDRODIURIL) 25 MG tablet Take 1 tablet by mouth once daily  . INVOKANA 100 MG TABS tablet TAKE 1 TABLET BY MOUTH BEFORE BREAKFAST  . metFORMIN (GLUCOPHAGE) 1000 MG tablet Take 1 tablet (1,000 mg total) by mouth 2 (two) times daily.  . metoprolol (LOPRESSOR) 100 MG tablet Take half tab in the morning and a whole tab in the evening.  (Patient taking differently: Take 100 mg by mouth 2 (two) times daily. )  . OZEMPIC, 0.25 OR 0.5 MG/DOSE, 2 MG/1.5ML SOPN Inject 0.5 mg into the skin every Monday.   . pantoprazole (PROTONIX) 40 MG tablet Take 1 tablet (40 mg total) by mouth daily. (Patient taking differently: Take 40 mg by mouth every Monday, Wednesday, and Friday at 8 PM. )  . Probiotic Product ([employer] SENIOR PROBIOTIC PO) Take 1 capsule by mouth daily with breakfast.  . telmisartan (MICARDIS) 80 MG tablet Take 1 tablet (80 mg total) by mouth daily. (Patient taking differently: Take 40 mg by mouth every evening. )  . vitamin B-12 (CYANOCOBALAMIN) 1000 MCG tablet Take 1,000 mcg by mouth daily with supper.   Facility-Administered Encounter Medications as of 01/11/2020  Medication  . sodium chloride flush (NS) 0.9 % injection 3 mL  . sodium chloride flush (NS) 0.9 % injection 3 mL     Patient Active Problem List   Diagnosis Date Noted  . 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  PTCAs with drug coated balloon Lt ant tibial artery and Lt popliteal artery  11/26/2019  . Gangrene of foot ([place]) 08/07/2019  . Normocytic anemia 08/07/2019  . 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 Critical lower limb ischemia 08/03/2019  . Encounter for immunization 07/09/2016  . Essential hypertension 03/31/2013  .  Dyslipidemia 03/31/2013  . DM2 (diabetes mellitus, type 2) ([place]) 03/31/2013  . Overweight (BMI 25.0-29.9) 03/31/2013  . Carotid artery disease ([place]) 03/31/2013  . PAD (peripheral artery disease) ([place]) 03/31/2013     Health Maintenance Due  Topic Date Due  . OPHTHALMOLOGY EXAM  Never done  . COVID-19 Vaccine (1) Never done  . TETANUS/TDAP  Never done  . DEXA SCAN  Never done  . PNA vac Low Risk Adult (1 of 2 - PCV13) Never done  . FOOT EXAM  05/02/2019     Review of Systems  Physical Exam   BP (!) 142/58   Pulse (!) 54   Temp 97.6 F (36.4 C)   Ht 5' 4.5" (1.638 m)   Wt 141 lb (64 kg)    SpO2 100%   BMI 23.83 kg/m   gen= a x o by 3 in nad HEENT = PERRLA, EOMI, no oral lesion Ext = right foot well healed, along incision, dry skin. Left foot 2 small lesions -eschar healing    CBC Lab Results  Component Value Date   WBC 9.9 12/09/2019   RBC 4.11 12/09/2019   HGB 11.3 (L) 12/09/2019   HCT 34.0 (L) 12/09/2019   PLT 427 (H) 12/09/2019   MCV 82.7 12/09/2019   MCH 27.5 12/09/2019   MCHC 33.2 12/09/2019   RDW 13.9 12/09/2019   LYMPHSABS 2,604 12/09/2019   MONOABS 0.9 11/01/2019   EOSABS 218 12/09/2019    BMET Lab Results  Component Value Date   NA 139 12/09/2019   K 4.6 12/09/2019   CL 102 12/09/2019   CO2 29 12/09/2019   GLUCOSE 168 (H) 12/09/2019   BUN 37 (H) 12/09/2019   CREATININE 0.86 12/09/2019   CALCIUM 10.8 (H) 12/09/2019   GFRNONAA 72 11/17/2019   GFRAA 84 11/17/2019    Lab Results  Component Value Date   ESRSEDRATE 2 01/11/2020   Lab Results  Component Value Date   CRP 9.2 (H) 01/11/2020     Assessment and Plan  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 Has small proximal eschar but no drainage or erythema  Left foot skin lesions also healing- from revascularization (previous eschar thought to be due to vascular insufficiency)  Will check Sed rate and crp   --- Addendum = elevated CRP - but sed rate WNL but clinically looks good. Will continue to monitor off of abtx. Hx of serratia and PsA that was sensitive to cipro. Reluctant to give cipro due ot high risk for cdiffiicile. Will see back in 2 wk to see if any changes and decide if need to restart abtx'  Spent 20 min with patient discussing treatment plan

## 2020-01-12 ENCOUNTER — Telehealth (INDEPENDENT_AMBULATORY_CARE_PROVIDER_SITE_OTHER): Payer: Medicare Other | Admitting: Physician Assistant

## 2020-01-12 ENCOUNTER — Telehealth: Payer: Self-pay

## 2020-01-12 VITALS — BP 142/55 | HR 68 | Ht 64.5 in | Wt 141.0 lb

## 2020-01-12 DIAGNOSIS — E1140 Type 2 diabetes mellitus with diabetic neuropathy, unspecified: Secondary | ICD-10-CM | POA: Diagnosis not present

## 2020-01-12 DIAGNOSIS — I6523 Occlusion and stenosis of bilateral carotid arteries: Secondary | ICD-10-CM

## 2020-01-12 DIAGNOSIS — I739 Peripheral vascular disease, unspecified: Secondary | ICD-10-CM | POA: Diagnosis not present

## 2020-01-12 DIAGNOSIS — E785 Hyperlipidemia, unspecified: Secondary | ICD-10-CM | POA: Diagnosis not present

## 2020-01-12 DIAGNOSIS — I10 Essential (primary) hypertension: Secondary | ICD-10-CM

## 2020-01-12 LAB — SEDIMENTATION RATE: Sed Rate: 2 mm/h (ref 0–30)

## 2020-01-12 LAB — C-REACTIVE PROTEIN: CRP: 9.2 mg/L — ABNORMAL HIGH (ref <8.0)

## 2020-01-12 NOTE — Telephone Encounter (Signed)
Called patient to discuss AVS instructions gave Hao Meng's recommendations and patient voiced understanding. AVS summary mailed to patient.    

## 2020-01-12 NOTE — Patient Instructions (Addendum)
Medication Instructions:  Your physician recommends that you continue on your current medications as directed. Please refer to the Current Medication list given to you today.  *If you need a refill on your cardiac medications before your next appointment, please call your pharmacy*  Lab Work: NONE ordered at this time of appointment   If you have labs (blood work) drawn today and your tests are completely normal, you will receive your results only by: Marland Kitchen MyChart Message (if you have MyChart) OR . A paper copy in the mail If you have any lab test that is abnormal or we need to change your treatment, we will call you to review the results.  Testing/Procedures: NONE ordered at this time of appointment   Follow-Up: At [facility], you and your health needs are our priority.  As part of our continuing mission to provide you with exceptional heart care, we have created designated Provider Care Teams.  These Care Teams include your primary Cardiologist (physician) and Advanced Practice Providers (APPs -  Physician Assistants and Nurse Practitioners) who all work together to provide you with the care you need, when you need it.  We recommend signing up for the patient portal called "MyChart".  Sign up information is provided on this After Visit Summary.  MyChart is used to connect with patients for Virtual Visits (Telemedicine).  Patients are able to view lab/test results, encounter notes, upcoming appointments, etc.  Non-urgent messages can be sent to your provider as well.   To learn more about what you can do with MyChart, go to [email or web address].    Your next appointment:   Follow up on or after Lower extremity dopplers on 06/09/2020  The format for your next appointment:   In Person  Provider:   [name], MD  Other Instructions

## 2020-01-12 NOTE — Progress Notes (Signed)
Virtual Visit via Telephone Note   This visit type was conducted due to national recommendations for restrictions regarding the COVID-19 Pandemic (e.g. social distancing) in an effort to limit this patient's exposure and mitigate transmission in our community.  Due to her co-morbid illnesses, this patient is at least at moderate risk for complications without adequate follow up.  This format is felt to be most appropriate for this patient at this time.  The patient did not have access to video technology/had technical difficulties with video requiring transitioning to audio format only (telephone).  All issues noted in this document were discussed and addressed.  No physical exam could be performed with this format.  Please refer to the patient's chart for her  consent to telehealth for [facility].   The patient was identified using 2 identifiers.  Date:  01/14/2020   ID:  [name], [name] 1941/04/24, MRN [id number]  Patient Location: Home Provider Location: Home  PCP:  [name], MD  Cardiologist:  [name], MD  Electrophysiologist:  None   Evaluation Performed:  Follow-Up Visit  Chief Complaint:  followup  History of Present Illness:    [name] is a 79 y.o. female with PMH of hypertension, hyperlipidemia, DM II, and PAD.  She has a history of diabetic peripheral neuropathy.  Lower extremity Doppler obtained in January 2020 revealed right ABI 0.75, left ABI 0.79 with moderate severe right SFA stenosis and occluded right posterior tibial artery.  Repeat lower extremity ABI obtained on 04/24/2019 showed a right ABI 0.6, left ABI 0.56, distal right CFA stenosis at 50 to 74%, right proximal SFA stenosis at 50 to 74%.  Patient was last seen by Dr. Gwenlyn Found on 06/02/2019 at which time she was agreeable to proceed with endovascular therapy for wound healing and lifestyle limiting claudication.  She eventually underwent lower extremity angiography on 06/15/2019 which revealed  80% proximal left SFA stenosis, 70% mellitus SFA stenosis, CTO distal SFA and above-knee popliteal artery reconstituting with two-vessel runoff, 90 to 95% distal right common femoral artery stenosis, 95% fairly focal calcified right SFA stenosis followed by 80% segmental stenosis with one-vessel runoff.  Attempt was made at the distal left SFA CTO, however this was unsuccessful.  Postprocedure, her ABI was repeated on 06/30/2019, ABI is unchanged.  I last saw the patient in October 2020, at which time she was having critical lower limb ischemia In the right lower extremity.  She was seen along with Dr. Gwenlyn Found and underwent successful orbital rotational atherectomy of high-grade calcified mid and distal right SFA disease, right popliteal stenosis, and the tibial peroneal stenosis.  Follow-up Doppler obtained in November 2020 revealed a widely patent SFA and tibioperoneal trunk.  Patient underwent transmetatarsal amputation by Dr. Earleen Newport on 08/07/2019.  She underwent revascularization of her left popliteal artery calcified CTO using antegrade and retrograde tibial pedal approach by Dr. Mariea Clonts on 11/25/2019.  Repeat Doppler obtained on 12/04/2019 showed ABI 0.76 was patent left popliteal artery and anterior tibial artery.  Patient was seen virtually today.  She does not have any new issue.  She has no lower extremity pain.  Lower extremity amputation wound has well-healed.  He was just seen by Dr. Graylon Good yesterday who noted she has very good skin healing.  She received her second [place] vaccine last Wednesday.  Overall, she has no lower extremity edema, orthopnea or PND.  She denies any new chest pain.  She is doing well and can follow-up with Dr. Gwenlyn Found in  4 to 5 months.  The patient does not have symptoms concerning for COVID-19 infection (fever, chills, cough, or new shortness of breath).    Past Medical History:  Diagnosis Date   Arthritis    Cancer ([place])    removal from nose - MOSE procedure    Complication  of anesthesia    VERSED- agitated, muscle spasms, "jerking" , frantic , (never had this occurence in the pas)    Diabetes mellitus without complication ([place])    GERD (gastroesophageal reflux disease)    Heart murmur    History of hiatal hernia    Hyperlipidemia    Hypertension 11/20/11   ECHO- EF>55% Borderline concentric left ventricular hypertrophy. There is a small calcified mass in the L:A near the LA appendage. No valvular masses seen with associated mitral annular calcification. LA Volume/ BSA27.4 ml/m2 No AS. Right ventricular systolic pressure is elevated at 93mmHg.   Peripheral vascular disease ([place])    Pneumonia    not hosp.    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  PTCAs with drug coated balloon Lt ant tibial artery and Lt popliteal artery  11/26/2019   Past Surgical History:  Procedure Laterality Date   ABDOMINAL AORTAGRAM  11/25/2019   ABDOMINAL AORTOGRAM W/LOWER EXTREMITY    ABDOMINAL AORTOGRAM N/A 06/15/2019   Procedure: ABDOMINAL AORTOGRAM;  Surgeon: [name], MD;  Location: [place] [facility];  Service: Cardiovascular;  Laterality: N/A;   ABDOMINAL AORTOGRAM W/LOWER EXTREMITY N/A 11/25/2019   Procedure: ABDOMINAL AORTOGRAM W/LOWER EXTREMITY;  Surgeon: [name], MD;  Location: [place] [facility];  Service: Cardiovascular;  Laterality: N/A;  Lt leg   APPENDECTOMY     APPLICATION OF WOUND VAC Right 07/21/2019   Procedure: Application Of [place]a Wound Vac Right Groin;  Surgeon: [name], MD;  Location: [facility];  Service: Vascular;  Laterality: Right;   CARPAL TUNNEL RELEASE     CARPAL TUNNEL RELEASE     CESAREAN SECTION     x 2   COLONOSCOPY     ENDARTERECTOMY FEMORAL Right 07/21/2019   Procedure: RIGHT ENDARTERECTOMY FEMORAL WITH PATCH ANGIOPLASTY;  Surgeon: [name], MD;  Location: [place];  Service: Vascular;  Laterality: Right;   INTRAVASCULAR LITHOTRIPSY  11/25/2019   Procedure: INTRAVASCULAR LITHOTRIPSY;   Surgeon: [name], MD;  Location: [place] [facility];  Service: Cardiovascular;;  LT. SFA   LOWER EXTREMITY ANGIOGRAPHY Bilateral 06/15/2019   Procedure: Lower Extremity Angiography;  Surgeon: [name], MD;  Location: [place] [facility];  Service: Cardiovascular;  Laterality: Bilateral;   LOWER EXTREMITY ANGIOGRAPHY Right 08/03/2019   Procedure: LOWER EXTREMITY ANGIOGRAPHY;  Surgeon: [name], MD;  Location: [place] [facility];  Service: Cardiovascular;  Laterality: Right;   PATCH ANGIOPLASTY Right 07/21/2019   Procedure: Patch Angioplasty Right Femoral Artery;  Surgeon: [name], MD;  Location: [place];  Service: Vascular;  Laterality: Right;   PERIPHERAL VASCULAR ATHERECTOMY  08/03/2019   Procedure: PERIPHERAL VASCULAR ATHERECTOMY;  Surgeon: [name], MD;  Location: [name] [facility];  Service: Cardiovascular;;  right SFA, right TP trunk   PERIPHERAL VASCULAR ATHERECTOMY  11/25/2019   Procedure: PERIPHERAL VASCULAR ATHERECTOMY;  Surgeon: [name], MD;  Location: [place] [facility];  Service: Cardiovascular;;  Lt.  POPLITEAL and AT   PERIPHERAL VASCULAR BALLOON ANGIOPLASTY Left 06/15/2019   Procedure: PERIPHERAL VASCULAR BALLOON ANGIOPLASTY;  Surgeon: [name], MD;  Location: [place] [facility];  Service: Cardiovascular;  Laterality: Left;  SFA UNSUCCESSFUL UNABLE TO CROSS LESION   PERIPHERAL VASCULAR BALLOON ANGIOPLASTY  08/03/2019   Procedure: PERIPHERAL VASCULAR BALLOON ANGIOPLASTY;  Surgeon: [name], MD;  Location: [place] [facility];  Service: Cardiovascular;;  right SFA, Right TP trunk   TONSILLECTOMY     and adenoidectomy   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/2019   Procedure: TRANSMETATARSAL AMPUTATION;  Surgeon: Trula Slade, DPM;  Location: [place];  Service: Podiatry;  Laterality: Right;   TUBAL LIGATION       Current Meds  Medication Sig   acetaminophen (TYLENOL) 500 MG tablet Take 1,000 mg by mouth  every 6 (six) hours as needed for moderate pain or headache.   Ascorbic Acid (VITAMIN C) 1000 MG tablet Take 1,000 mg by mouth daily with supper.    aspirin EC 81 MG tablet Take 81 mg by mouth every evening.   atorvastatin (LIPITOR) 20 MG tablet Take 1 tablet by mouth once daily   calcium carbonate (TUMS - DOSED IN MG ELEMENTAL CALCIUM) 500 MG chewable tablet Chew 2 tablets by mouth daily as needed for indigestion or heartburn.   CINNAMON PO Take 1,000 mg by mouth in the morning, at noon, and at bedtime. Breakfast, Supper, & bedtime   clopidogrel (PLAVIX) 75 MG tablet Take 1 tablet by mouth once daily (Patient taking differently: Take 75 mg by mouth daily. )   diltiazem (CARTIA XT) 240 MG 24 hr capsule Take 1 capsule (240 mg total) by mouth daily.   glipiZIDE (GLUCOTROL XL) 5 MG 24 hr tablet Take 1 tablet by mouth once daily with breakfast (Patient taking differently: Take 5 mg by mouth daily with breakfast. )   hydrochlorothiazide (HYDRODIURIL) 25 MG tablet Take 1 tablet by mouth once daily   INVOKANA 100 MG TABS tablet TAKE 1 TABLET BY MOUTH BEFORE BREAKFAST   metFORMIN (GLUCOPHAGE) 1000 MG tablet Take 1 tablet (1,000 mg total) by mouth 2 (two) times daily.   metoprolol (LOPRESSOR) 100 MG tablet Take half tab in the morning and a whole tab in the evening. (Patient taking differently: Take 100 mg by mouth 2 (two) times daily. )   OZEMPIC, 0.25 OR 0.5 MG/DOSE, 2 MG/1.5ML SOPN Inject 0.5 mg into the skin every Monday.    pantoprazole (PROTONIX) 40 MG tablet Take 1 tablet (40 mg total) by mouth daily. (Patient taking differently: Take 40 mg by mouth every Monday, Wednesday, and Friday at 8 PM. )   Probiotic Product ([employer] SENIOR PROBIOTIC PO) Take 1 capsule by mouth daily with breakfast.   telmisartan (MICARDIS) 80 MG tablet Take 1 tablet (80 mg total) by mouth daily. (Patient taking differently: Take 40 mg by mouth every evening. )   vitamin B-12 (CYANOCOBALAMIN) 1000 MCG tablet Take  1,000 mcg by mouth daily with supper.   Current Facility-Administered Medications for the 01/12/20 encounter (Telemedicine) with Almyra Deforest, PA  Medication   sodium chloride flush (NS) 0.9 % injection 3 mL   sodium chloride flush (NS) 0.9 % injection 3 mL     Allergies:   Bactrim [sulfamethoxazole-trimethoprim], Demerol [meperidine], Scopolamine, and Versed [midazolam]   Social History   Tobacco Use   Smoking status: Former Smoker    Quit date: 03/31/1972    Years since quitting: 47.8   Smokeless tobacco: Never Used  Substance Use Topics   Alcohol use: No   Drug use: No     Family Hx: The patient's family history includes Cancer - Colon in  her father; Cancer - Prostate in her father; Hyperlipidemia in her mother; Hypertension in her mother; Melanoma in her brother; Stroke in her mother.  ROS:   Please see the history of present illness.     All other systems reviewed and are negative.   Prior CV studies:   The following studies were reviewed today:  LE angiography 11/25/2019 1.  Nonobstructive aortoiliac disease. 2.  Left lower extremity: Diffuse calcified SFA disease, short occlusion of the popliteal artery above the knee and two-vessel runoff below the knee with diffuse disease in the proximal anterior tibial artery. 3.  Successful complex retrograde and antegrade revascularization of the left lower extremity as outlined above.  Recommendations: Continue long-term dual antiplatelet therapy. Aggressive treatment of risk factors. Hydrate overnight.  Labs/Other Tests and Data Reviewed:    EKG:  An ECG dated 09/16/2019 was personally reviewed today and demonstrated:  Normal sinus rhythm with T wave inversion in the lateral leads.  Recent Labs: 06/09/2019: TSH 1.610 07/21/2019: ALT 15 12/09/2019: BUN 37; Creat 0.86; Hemoglobin 11.3; Platelets 427; Potassium 4.6; Sodium 139   Recent Lipid Panel Lab Results  Component Value Date/Time   CHOL 118 06/29/2019 08:32 AM    CHOL 164 04/06/2013 09:09 AM   TRIG 158.0 (H) 06/29/2019 08:32 AM   TRIG 187 (H) 04/06/2013 09:09 AM   HDL 33.40 (L) 06/29/2019 08:32 AM   HDL 40 04/06/2013 09:09 AM   CHOLHDL 4 06/29/2019 08:32 AM   LDLCALC 53 06/29/2019 08:32 AM   LDLCALC 87 04/06/2013 09:09 AM    Wt Readings from Last 3 Encounters:  01/12/20 141 lb (64 kg)  01/11/20 141 lb (64 kg)  12/08/19 142 lb 6.4 oz (64.6 kg)     Objective:    Vital Signs:  BP (!) 142/55    Pulse 68    Ht 5' 4.5" (1.638 m)    Wt 141 lb (64 kg)    BMI 23.83 kg/m    VITAL SIGNS:  reviewed  ASSESSMENT & PLAN:    1. PAD: Followed by Dr. Fletcher Anon and Dr. Gwenlyn Found.  Denies any significant claudication symptoms.  2. Hypertension: Blood pressure stable on current therapy  3. Hyperlipidemia: On Lipitor  4. DM2 with peripheral neuropathy: Managed by primary care provider  COVID-19 Education: The signs and symptoms of COVID-19 were discussed with the patient and how to seek care for testing (follow up with PCP or arrange E-visit).  The importance of social distancing was discussed today.  Time:   Today, I have spent 11 minutes with the patient with telehealth technology discussing the above problems.     Medication Adjustments/Labs and Tests Ordered: Current medicines are reviewed at length with the patient today.  Concerns regarding medicines are outlined above.   Tests Ordered: No orders of the defined types were placed in this encounter.   Medication Changes: No orders of the defined types were placed in this encounter.   Follow Up:  Either In Person or Virtual in 6 month(s)  Signed, Almyra Deforest, [place]  01/14/2020 11:19 PM    [facility]

## 2020-01-13 DIAGNOSIS — Z89431 Acquired absence of right foot: Secondary | ICD-10-CM | POA: Diagnosis not present

## 2020-01-13 DIAGNOSIS — I739 Peripheral vascular disease, unspecified: Secondary | ICD-10-CM | POA: Diagnosis not present

## 2020-01-13 DIAGNOSIS — R634 Abnormal weight loss: Secondary | ICD-10-CM | POA: Diagnosis not present

## 2020-01-13 DIAGNOSIS — E11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: Secondary | ICD-10-CM | POA: Diagnosis not present

## 2020-01-13 DIAGNOSIS — Z7984 Long term (current) use of oral hypoglycemic drugs: Secondary | ICD-10-CM | POA: Diagnosis not present

## 2020-01-13 DIAGNOSIS — I10 Essential (primary) hypertension: Secondary | ICD-10-CM | POA: Diagnosis not present

## 2020-01-15 ENCOUNTER — Telehealth: Payer: Self-pay | Admitting: Cardiovascular Disease

## 2020-01-15 NOTE — Telephone Encounter (Signed)
Returned call to patient-she states scheduling was calling to schedule f/u with JB after dopplers.   Nothing further needed.

## 2020-01-15 NOTE — Telephone Encounter (Signed)
Patient states she is returning a call. However, she is not sure who called or what the call may have been in regards to. No current phone notes available. Please advise.

## 2020-01-19 DIAGNOSIS — M316 Other giant cell arteritis: Secondary | ICD-10-CM | POA: Diagnosis not present

## 2020-01-19 DIAGNOSIS — H02831 Dermatochalasis of right upper eyelid: Secondary | ICD-10-CM | POA: Diagnosis not present

## 2020-01-19 DIAGNOSIS — Z961 Presence of intraocular lens: Secondary | ICD-10-CM | POA: Diagnosis not present

## 2020-01-19 DIAGNOSIS — H0102B Squamous blepharitis left eye, upper and lower eyelids: Secondary | ICD-10-CM | POA: Diagnosis not present

## 2020-01-19 DIAGNOSIS — H0102A Squamous blepharitis right eye, upper and lower eyelids: Secondary | ICD-10-CM | POA: Diagnosis not present

## 2020-01-19 DIAGNOSIS — H47011 Ischemic optic neuropathy, right eye: Secondary | ICD-10-CM | POA: Diagnosis not present

## 2020-01-19 DIAGNOSIS — H02834 Dermatochalasis of left upper eyelid: Secondary | ICD-10-CM | POA: Diagnosis not present

## 2020-01-21 ENCOUNTER — Other Ambulatory Visit: Payer: Self-pay | Admitting: Endocrinology

## 2020-01-26 ENCOUNTER — Other Ambulatory Visit: Payer: Self-pay

## 2020-01-26 ENCOUNTER — Other Ambulatory Visit (INDEPENDENT_AMBULATORY_CARE_PROVIDER_SITE_OTHER): Payer: Medicare Other

## 2020-01-26 DIAGNOSIS — E1159 Type 2 diabetes mellitus with other circulatory complications: Secondary | ICD-10-CM | POA: Diagnosis not present

## 2020-01-26 LAB — BASIC METABOLIC PANEL
BUN: 34 mg/dL — ABNORMAL HIGH (ref 6–23)
CO2: 30 mEq/L (ref 19–32)
Calcium: 10.6 mg/dL — ABNORMAL HIGH (ref 8.4–10.5)
Chloride: 100 mEq/L (ref 96–112)
Creatinine, Ser: 0.97 mg/dL (ref 0.40–1.20)
GFR: 55.56 mL/min — ABNORMAL LOW (ref >60.00)
Glucose, Bld: 118 mg/dL — ABNORMAL HIGH (ref 70–99)
Potassium: 4.4 mEq/L (ref 3.5–5.1)
Sodium: 137 mEq/L (ref 135–145)

## 2020-01-26 LAB — HEMOGLOBIN A1C: Hgb A1c MFr Bld: 6 % (ref 4.6–6.5)

## 2020-02-02 ENCOUNTER — Other Ambulatory Visit: Payer: Self-pay

## 2020-02-02 ENCOUNTER — Ambulatory Visit (INDEPENDENT_AMBULATORY_CARE_PROVIDER_SITE_OTHER): Payer: Medicare Other | Admitting: Endocrinology

## 2020-02-02 ENCOUNTER — Encounter: Payer: Self-pay | Admitting: Endocrinology

## 2020-02-02 VITALS — BP 120/46 | HR 70 | Ht 64.5 in | Wt 141.4 lb

## 2020-02-02 DIAGNOSIS — E1159 Type 2 diabetes mellitus with other circulatory complications: Secondary | ICD-10-CM

## 2020-02-02 DIAGNOSIS — I10 Essential (primary) hypertension: Secondary | ICD-10-CM | POA: Diagnosis not present

## 2020-02-02 DIAGNOSIS — Z89439 Acquired absence of unspecified foot: Secondary | ICD-10-CM | POA: Diagnosis not present

## 2020-02-02 DIAGNOSIS — I6523 Occlusion and stenosis of bilateral carotid arteries: Secondary | ICD-10-CM | POA: Diagnosis not present

## 2020-02-02 MED ORDER — HYDROCHLOROTHIAZIDE 12.5 MG PO TABS: 12.5000 mg | ORAL_TABLET | Freq: Every day | ORAL | 2 refills | Status: DC

## 2020-02-02 NOTE — Progress Notes (Signed)
Patient ID: [name], female   DOB: 1941-05-07, 79 y.o.   MRN: [id number]          Reason for Appointment: Follow-up for endocrinology problems   History of Present Illness:          Date of diagnosis of type 2 diabetes mellitus: 1984       Background history:   She has been on metformin since about the time of her diagnosis and has been on other drugs also but has not had many changes in her regimen in the last several years She thinks her blood sugar used to be fairly well controlled At some point she was given Byetta for about a year and a half which improved her control but this was subsequently start because of it being too expensive for her.  She had no side effects with this  Recent history:   Her A1c is stable at 6%   Non-insulin hypoglycemic regimen: Glipizide ER 5 mg daily, Invokana 100 mg daily,   metformin 1 g twice daily  Current management, blood sugar patterns and problems identified:  She has not taken her Ozempic as recommended in February because of the cost  Also although previously was on 300 mg of Invokana she is currently only on 100 mg  She still is not able to walk because of recovering from her foot infections and amputation  Again she forgets to check her readings after meals and is taking only in the mornings  Has only 1 reading after breakfast which was 191 but otherwise blood glucose readings are before eating  Weight is the same  She does not report any hypoglycemic symptoms at lunch or dinner despite continuing her glipizide as before  She states that she is eating small portions with some protein with most meals  No change in renal function or any recent infections with Invokana        Side effects from medications have been: None  Compliance with the medical regimen: Good Hypoglycemia:   None  Glucose monitoring:  About 1 times a day         Glucometer: One Touch.       Blood Glucose readings by time of day   PRE-MEAL Fasting  Lunch Dinner Bedtime Overall  Glucose range:  97-167      Mean/median:     126   Readings previously  PRE-MEAL Fasting Lunch Dinner Bedtime Overall  Glucose range:  88-132  120     Mean/median:     106      Self-care: The diet that the patient has been following is: tries to limit sweets and fried food.     Meal times are:  Breakfast is at 8:30 AM lunch: 1 PM, dinner: 6:30 PM  Typical meal intake: Breakfast is yogurt with fruit or egg/toast or oatmeal Trying to get some boost with protein  Lunch is usually soup with crackers, dinner is usually a low-fat meat with vegetables Snacks will be celery and ranch dressing               Dietician visit, most recent: 10/18                Weight history:  Wt Readings from Last 3 Encounters:  02/02/20 141 lb 6.4 oz (64.1 kg)  01/12/20 141 lb (64 kg)  01/11/20 141 lb (64 kg)    Glycemic control:   Lab Results  Component Value Date   HGBA1C 6.0 01/26/2020  HGBA1C 6.0 10/12/2019   HGBA1C 6.7 (H) 07/21/2019   Lab Results  Component Value Date   MICROALBUR 2.0 (H) 06/29/2019   LDLCALC 53 06/29/2019   CREATININE 0.97 01/26/2020   Lab Results  Component Value Date   MICRALBCREAT 2.2 06/29/2019    Lab Results  Component Value Date   FRUCTOSAMINE 243 03/18/2019   FRUCTOSAMINE 268 10/21/2018   FRUCTOSAMINE 267 05/30/2018      Allergies as of 02/02/2020      Reactions   Bactrim [sulfamethoxazole-trimethoprim] Other (See Comments)   ^ K+( elevated)    Demerol [meperidine]    Delusional    Scopolamine    Delusional    Versed [midazolam] Anxiety   Frantic, out of my mind, agitated       Medication List       Accurate as of Feb 02, 2020 10:24 AM. If you have any questions, ask your nurse or doctor.        acetaminophen 500 MG tablet Commonly known as: TYLENOL Take 1,000 mg by mouth every 6 (six) hours as needed for moderate pain or headache.   aspirin EC 81 MG tablet Take 81 mg by mouth every evening.     atorvastatin 20 MG tablet Commonly known as: LIPITOR Take 1 tablet by mouth once daily   calcium carbonate 500 MG chewable tablet Commonly known as: TUMS - dosed in mg elemental calcium Chew 2 tablets by mouth daily as needed for indigestion or heartburn.   CINNAMON PO Take 1,000 mg by mouth in the morning, at noon, and at bedtime. Breakfast, Supper, & bedtime   clopidogrel 75 MG tablet Commonly known as: PLAVIX Take 1 tablet by mouth once daily   [employer] SENIOR PROBIOTIC PO Take 1 capsule by mouth daily with breakfast.   diltiazem 240 MG 24 hr capsule Commonly known as: Cartia XT Take 1 capsule (240 mg total) by mouth daily.   glipiZIDE 5 MG 24 hr tablet Commonly known as: GLUCOTROL XL Take 1 tablet by mouth once daily with breakfast   hydrochlorothiazide 25 MG tablet Commonly known as: HYDRODIURIL Take 1 tablet by mouth once daily   Invokana 100 MG Tabs tablet Generic drug: canagliflozin TAKE 1 TABLET BY MOUTH BEFORE BREAKFAST   metFORMIN 1000 MG tablet Commonly known as: GLUCOPHAGE Take 1 tablet (1,000 mg total) by mouth 2 (two) times daily.   metoprolol tartrate 100 MG tablet Commonly known as: LOPRESSOR Take half tab in the morning and a whole tab in the evening. What changed:   how much to take  how to take this  when to take this  additional instructions   Ozempic (0.25 or 0.5 MG/DOSE) 2 MG/1.5ML Sopn Generic drug: Semaglutide(0.25 or 0.5MG /DOS) Inject 0.5 mg into the skin every Monday.   pantoprazole 40 MG tablet Commonly known as: PROTONIX Take 1 tablet (40 mg total) by mouth daily. What changed: when to take this   telmisartan 80 MG tablet Commonly known as: MICARDIS Take 1 tablet (80 mg total) by mouth daily. What changed:   how much to take  when to take this   vitamin B-12 1000 MCG tablet Commonly known as: CYANOCOBALAMIN Take 1,000 mcg by mouth daily with supper.   vitamin C 1000 MG tablet Take 1,000 mg by mouth daily with  supper.       Allergies:  Allergies  Allergen Reactions  . Bactrim [Sulfamethoxazole-Trimethoprim] Other (See Comments)    ^ K+( elevated)   . Demerol [Meperidine]  Delusional   . Scopolamine     Delusional   . Versed [Midazolam] Anxiety    Frantic, out of my mind, agitated     Past Medical History:  Diagnosis Date  . Arthritis   . Cancer ([place])    removal from nose - MOSE procedure   . Complication of anesthesia    VERSED- agitated, muscle spasms, "jerking" , frantic , (never had this occurence in the pas)   . Diabetes mellitus without complication ([place])   . GERD (gastroesophageal reflux disease)   . Heart murmur   . History of hiatal hernia   . Hyperlipidemia   . Hypertension 11/20/11   ECHO- EF>55% Borderline concentric left ventricular hypertrophy. There is a small calcified mass in the L:A near the LA appendage. No valvular masses seen with associated mitral annular calcification. LA Volume/ BSA27.4 ml/m2 No AS. Right ventricular systolic pressure is elevated at 53mmHg.  Marland Kitchen Peripheral vascular disease ([place])   . Pneumonia    not hosp.   . 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  PTCAs with drug coated balloon Lt ant tibial artery and Lt popliteal artery  11/26/2019    Past Surgical History:  Procedure Laterality Date  . ABDOMINAL AORTAGRAM  11/25/2019   ABDOMINAL AORTOGRAM W/LOWER EXTREMITY   . ABDOMINAL AORTOGRAM N/A 06/15/2019   Procedure: ABDOMINAL AORTOGRAM;  Surgeon: [name], MD;  Location: [place] [facility];  Service: Cardiovascular;  Laterality: N/A;  . ABDOMINAL AORTOGRAM W/LOWER EXTREMITY N/A 11/25/2019   Procedure: ABDOMINAL AORTOGRAM W/LOWER EXTREMITY;  Surgeon: [name], MD;  Location: [place] [facility];  Service: Cardiovascular;  Laterality: N/A;  Lt leg  . APPENDECTOMY    . APPLICATION OF WOUND VAC Right 07/21/2019   Procedure: Application Of Prevena Wound Vac Right Groin;  Surgeon: [name], MD;  Location: [place];   Service: Vascular;  Laterality: Right;  . CARPAL TUNNEL RELEASE    . CARPAL TUNNEL RELEASE    . CESAREAN SECTION     x 2  . COLONOSCOPY    . ENDARTERECTOMY FEMORAL Right 07/21/2019   Procedure: RIGHT ENDARTERECTOMY FEMORAL WITH PATCH ANGIOPLASTY;  Surgeon: [name], MD;  Location: [place];  Service: Vascular;  Laterality: Right;  . INTRAVASCULAR LITHOTRIPSY  11/25/2019   Procedure: INTRAVASCULAR LITHOTRIPSY;  Surgeon: [name], MD;  Location: [place] [facility];  Service: Cardiovascular;;  LT. SFA  . LOWER EXTREMITY ANGIOGRAPHY Bilateral 06/15/2019   Procedure: Lower Extremity Angiography;  Surgeon: [name], MD;  Location: [place] [facility];  Service: Cardiovascular;  Laterality: Bilateral;  . LOWER EXTREMITY ANGIOGRAPHY Right 08/03/2019   Procedure: LOWER EXTREMITY ANGIOGRAPHY;  Surgeon: [name], MD;  Location: [place] [facility];  Service: Cardiovascular;  Laterality: Right;  . PATCH ANGIOPLASTY Right 07/21/2019   Procedure: Patch Angioplasty Right Femoral Artery;  Surgeon: [name], MD;  Location: [place];  Service: Vascular;  Laterality: Right;  . PERIPHERAL VASCULAR ATHERECTOMY  08/03/2019   Procedure: PERIPHERAL VASCULAR ATHERECTOMY;  Surgeon: [name], MD;  Location: [place] [facility];  Service: Cardiovascular;;  right SFA, right TP trunk  . PERIPHERAL VASCULAR ATHERECTOMY  11/25/2019   Procedure: PERIPHERAL VASCULAR ATHERECTOMY;  Surgeon: [name], MD;  Location: [place] [facility];  Service: Cardiovascular;;  Lt.  POPLITEAL and AT  . PERIPHERAL VASCULAR BALLOON ANGIOPLASTY Left 06/15/2019   Procedure: PERIPHERAL VASCULAR BALLOON ANGIOPLASTY;  Surgeon: [name], MD;  Location:  [place] [facility];  Service: Cardiovascular;  Laterality: Left;  SFA UNSUCCESSFUL UNABLE TO CROSS LESION  . PERIPHERAL VASCULAR BALLOON ANGIOPLASTY  08/03/2019   Procedure: PERIPHERAL VASCULAR BALLOON ANGIOPLASTY;  Surgeon: [name], MD;   Location: [place] [facility];  Service: Cardiovascular;;  right SFA, Right TP trunk  . TONSILLECTOMY     and adenoidectomy  . 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/2019   Procedure: TRANSMETATARSAL AMPUTATION;  Surgeon: Trula Slade, DPM;  Location: [place];  Service: Podiatry;  Laterality: Right;  . TUBAL LIGATION      Family History  Problem Relation Age of Onset  . Cancer - Prostate Father   . Cancer - Colon Father   . Stroke Mother   . Hypertension Mother   . Hyperlipidemia Mother   . Melanoma Brother     Social History:  reports that she quit smoking about 47 years ago. She has never used smokeless tobacco. She reports that she does not drink alcohol or use drugs.   Review of Systems   Lipid history: Hypercholesterolemia by PCP with 20 mg Lipitor    Lab Results  Component Value Date   CHOL 118 06/29/2019   HDL 33.40 (L) 06/29/2019   LDLCALC 53 06/29/2019   TRIG 158.0 (H) 06/29/2019   CHOLHDL 4 06/29/2019           Hypertension: Present for several years, currently treated with  Micardis 40 mg, metoprolol, diltiazem and HCTZ, also followed by cardiologist Micardis was reduced in 06/2019 because of low normal blood pressure readings  She says her blood pressure at home is usually about 001 systolic and rarely up to 140 No lightheadedness  Also on 100 mg Invokana, renal function stable  BP Readings from Last 3 Encounters:  02/02/20 (!) 120/46  01/12/20 (!) 142/55  01/11/20 (!) 142/58   Lab Results  Component Value Date   CREATININE 0.97 01/26/2020   CREATININE 0.86 12/09/2019   CREATININE 0.79 11/17/2019     Most recent eye exam was in 4/21  Most recent foot exam: At wound care center  She has had significant PAD and followed by vascular cardiologist   LABS:  No visits with results within 1 Week(s) from this visit.  Latest known visit with results is:  Lab on 01/26/2020  Component Date Value Ref Range Status  . Hgb A1c MFr Bld 01/26/2020  6.0  4.6 - 6.5 % Final   Glycemic Control Guidelines for People with Diabetes:Non Diabetic:  <6%Goal of Therapy: <7%Additional Action Suggested:  >8%   . Sodium 01/26/2020 137  135 - 145 mEq/L Final  . Potassium 01/26/2020 4.4  3.5 - 5.1 mEq/L Final  . Chloride 01/26/2020 100  96 - 112 mEq/L Final  . CO2 01/26/2020 30  19 - 32 mEq/L Final  . Glucose, Bld 01/26/2020 118* 70 - 99 mg/dL Final  . BUN 01/26/2020 34* 6 - 23 mg/dL Final  . Creatinine, Ser 01/26/2020 0.97  0.40 - 1.20 mg/dL Final  . GFR 01/26/2020 55.56* >60.00 mL/min Final  . Calcium 01/26/2020 10.6* 8.4 - 10.5 mg/dL Final    Physical Examination:  BP (!) 120/46 (BP Location: Left Arm, Patient Position: Sitting, Cuff Size: Normal)   Pulse 70   Ht 5' 4.5" (1.638 m)   Wt 141 lb 6.4 oz (64.1 kg)   SpO2 98%   BMI 23.90 kg/m       ASSESSMENT:  Diabetes type 2  See history of present illness for detailed discussion of  current diabetes management, blood sugar patterns and problems identified  Her A1c is stable at 6%  Blood sugars at home are being checked only in the morning and averaging about 120 No hypoglycemia with glipizide Currently blood sugars have not gone up but not continue Ozempic which was stopped because of cost She has really been doing well with her diet and kept her weight down as well as avoiding high carbohydrate and high fat meals   Hypertension: Blood pressure is well controlled with above regimen She is on 25 mg HCTZ, she does not think she can cut these pills in half Blood pressure is low normal today and is also on Invokana;  Renal function stable, last microalbumin normal  History of right transmetatarsal amputation: She is now going to order some special shoes    PLAN:    Continue glipizide and Metformin unchanged No change in Invokana 100 mg at this time She will check readings alternating morning and after meals at different times Discussed blood sugar target  For her hypertension  she can start using 12.5 mg HCTZ since she is already on Invokana and blood pressure is low normal  Lipids: She is followed annually at cardiology clinic    There are no Patient Instructions on file for this visit.   Elayne Snare 02/02/2020, 10:24 AM   Note: This office note was prepared with Dragon voice recognition system technology. Any transcriptional errors that result from this process are unintentional.

## 2020-02-02 NOTE — Patient Instructions (Signed)
Check blood sugars on waking up 3 days a week  Also check blood sugars about 2 hours after meals and do this after different meals by rotation  Recommended blood sugar levels on waking up are 90-130 and about 2 hours after meal is 130-160  Please bring your blood sugar monitor to each visit, thank you   

## 2020-02-19 ENCOUNTER — Other Ambulatory Visit: Payer: Self-pay | Admitting: Endocrinology

## 2020-02-29 DIAGNOSIS — Z1231 Encounter for screening mammogram for malignant neoplasm of breast: Secondary | ICD-10-CM | POA: Diagnosis not present

## 2020-03-21 ENCOUNTER — Encounter (HOSPITAL_COMMUNITY): Payer: Self-pay | Admitting: *Deleted

## 2020-03-21 ENCOUNTER — Other Ambulatory Visit: Payer: Self-pay | Admitting: Internal Medicine

## 2020-03-21 ENCOUNTER — Other Ambulatory Visit: Payer: Self-pay

## 2020-03-21 ENCOUNTER — Inpatient Hospital Stay (HOSPITAL_COMMUNITY)
Admission: EM | Admit: 2020-03-21 | Discharge: 2020-03-30 | DRG: 229 | Disposition: A | Discharge status: Home / Self Care | Payer: Medicare Other | Attending: Cardiothoracic Surgery | Admitting: Cardiothoracic Surgery

## 2020-03-21 ENCOUNTER — Emergency Department (HOSPITAL_COMMUNITY): Payer: Medicare Other

## 2020-03-21 DIAGNOSIS — Z20822 Contact with and (suspected) exposure to covid-19: Secondary | ICD-10-CM | POA: Diagnosis not present

## 2020-03-21 DIAGNOSIS — E11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: Secondary | ICD-10-CM | POA: Diagnosis present

## 2020-03-21 DIAGNOSIS — R0789 Other chest pain: Secondary | ICD-10-CM | POA: Diagnosis not present

## 2020-03-21 DIAGNOSIS — Z87891 Personal history of nicotine dependence: Secondary | ICD-10-CM

## 2020-03-21 DIAGNOSIS — Z7902 Long term (current) use of antithrombotics/antiplatelets: Secondary | ICD-10-CM

## 2020-03-21 DIAGNOSIS — Z8249 Family history of ischemic heart disease and other diseases of the circulatory system: Secondary | ICD-10-CM

## 2020-03-21 DIAGNOSIS — I10 Essential (primary) hypertension: Secondary | ICD-10-CM | POA: Diagnosis not present

## 2020-03-21 DIAGNOSIS — Z85828 Personal history of other malignant neoplasm of skin: Secondary | ICD-10-CM

## 2020-03-21 DIAGNOSIS — D62 Acute posthemorrhagic anemia: Secondary | ICD-10-CM | POA: Diagnosis not present

## 2020-03-21 DIAGNOSIS — Z7982 Long term (current) use of aspirin: Secondary | ICD-10-CM

## 2020-03-21 DIAGNOSIS — K449 Diaphragmatic hernia without obstruction or gangrene: Secondary | ICD-10-CM | POA: Diagnosis present

## 2020-03-21 DIAGNOSIS — I214 Non-ST elevation (NSTEMI) myocardial infarction: Principal | ICD-10-CM | POA: Diagnosis present

## 2020-03-21 DIAGNOSIS — N1831 Chronic kidney disease, stage 3a: Secondary | ICD-10-CM

## 2020-03-21 DIAGNOSIS — Z951 Presence of aortocoronary bypass graft: Secondary | ICD-10-CM

## 2020-03-21 DIAGNOSIS — Z79899 Other long term (current) drug therapy: Secondary | ICD-10-CM

## 2020-03-21 DIAGNOSIS — I25110 Atherosclerotic heart disease of native coronary artery with unstable angina pectoris: Secondary | ICD-10-CM | POA: Diagnosis present

## 2020-03-21 DIAGNOSIS — Z9851 Tubal ligation status: Secondary | ICD-10-CM

## 2020-03-21 DIAGNOSIS — Z9889 Other specified postprocedural states: Secondary | ICD-10-CM

## 2020-03-21 DIAGNOSIS — E785 Hyperlipidemia, unspecified: Secondary | ICD-10-CM | POA: Diagnosis present

## 2020-03-21 DIAGNOSIS — Z7984 Long term (current) use of oral hypoglycemic drugs: Secondary | ICD-10-CM

## 2020-03-21 DIAGNOSIS — E119 Type 2 diabetes mellitus without complications: Secondary | ICD-10-CM

## 2020-03-21 DIAGNOSIS — Z9049 Acquired absence of other specified parts of digestive tract: Secondary | ICD-10-CM

## 2020-03-21 DIAGNOSIS — I2510 Atherosclerotic heart disease of native coronary artery without angina pectoris: Secondary | ICD-10-CM

## 2020-03-21 DIAGNOSIS — E7800 Pure hypercholesterolemia, unspecified: Secondary | ICD-10-CM | POA: Diagnosis present

## 2020-03-21 DIAGNOSIS — I452 Bifascicular block: Secondary | ICD-10-CM | POA: Diagnosis not present

## 2020-03-21 DIAGNOSIS — R079 Chest pain, unspecified: Secondary | ICD-10-CM | POA: Diagnosis not present

## 2020-03-21 DIAGNOSIS — Z888 Allergy status to other drugs, medicaments and biological substances status: Secondary | ICD-10-CM

## 2020-03-21 DIAGNOSIS — K219 Gastro-esophageal reflux disease without esophagitis: Secondary | ICD-10-CM | POA: Diagnosis present

## 2020-03-21 LAB — BASIC METABOLIC PANEL
Anion gap: 13 (ref 5–15)
BUN: 21 mg/dL (ref 8–23)
CO2: 23 mmol/L (ref 22–32)
Calcium: 10.2 mg/dL (ref 8.9–10.3)
Chloride: 104 mmol/L (ref 98–111)
Creatinine, Ser: 0.93 mg/dL (ref 0.44–1.00)
GFR calc Af Amer: >60 mL/min (ref >60)
GFR calc non Af Amer: 59 mL/min — ABNORMAL LOW (ref >60)
Glucose, Bld: 136 mg/dL — ABNORMAL HIGH (ref 70–99)
Potassium: 3.9 mmol/L (ref 3.5–5.1)
Sodium: 140 mmol/L (ref 135–145)

## 2020-03-21 LAB — CBC
HCT: 40.8 % (ref 36.0–46.0)
Hemoglobin: 13.3 g/dL (ref 12.0–15.0)
MCH: 27.7 pg (ref 26.0–34.0)
MCHC: 32.6 g/dL (ref 30.0–36.0)
MCV: 84.8 fL (ref 80.0–100.0)
Platelets: 260 10*3/uL (ref 150–400)
RBC: 4.81 MIL/uL (ref 3.87–5.11)
RDW: 13.2 % (ref 11.5–15.5)
WBC: 9.7 10*3/uL (ref 4.0–10.5)
nRBC: 0 % (ref 0.0–0.2)

## 2020-03-21 LAB — TROPONIN I (HIGH SENSITIVITY)
Troponin I (High Sensitivity): 24 ng/L — ABNORMAL HIGH (ref <18)
Troponin I (High Sensitivity): 553 ng/L (ref <18)

## 2020-03-21 MED ORDER — SODIUM CHLORIDE 0.9% FLUSH
3.0000 mL | Freq: Once | INTRAVENOUS | Status: AC
  Administered 2020-03-22: 3 mL via INTRAVENOUS

## 2020-03-21 NOTE — ED Notes (Signed)
Dr. Dina Rich and CRN aware of trop.

## 2020-03-21 NOTE — ED Triage Notes (Signed)
To ED for eval of cp that started while watching TV. Pt states pain radiates to arms and up to jaw. Intensity has subsided. 325mg  ASA taken pta. IV started by EMS in left wrist area. No nausea or vomiting.

## 2020-03-22 ENCOUNTER — Other Ambulatory Visit (HOSPITAL_COMMUNITY): Payer: Medicare Other

## 2020-03-22 ENCOUNTER — Encounter (HOSPITAL_COMMUNITY)
Admission: EM | Disposition: A | Discharge status: Home / Self Care | Payer: Self-pay | Source: Home / Self Care | Attending: Cardiothoracic Surgery

## 2020-03-22 ENCOUNTER — Encounter (HOSPITAL_COMMUNITY): Payer: Self-pay | Admitting: Cardiology

## 2020-03-22 ENCOUNTER — Other Ambulatory Visit: Payer: Self-pay

## 2020-03-22 ENCOUNTER — Other Ambulatory Visit: Payer: Self-pay | Admitting: *Deleted

## 2020-03-22 DIAGNOSIS — Z85828 Personal history of other malignant neoplasm of skin: Secondary | ICD-10-CM | POA: Diagnosis not present

## 2020-03-22 DIAGNOSIS — E785 Hyperlipidemia, unspecified: Secondary | ICD-10-CM | POA: Diagnosis present

## 2020-03-22 DIAGNOSIS — E1159 Type 2 diabetes mellitus with other circulatory complications: Secondary | ICD-10-CM | POA: Diagnosis not present

## 2020-03-22 DIAGNOSIS — Z7902 Long term (current) use of antithrombotics/antiplatelets: Secondary | ICD-10-CM | POA: Diagnosis not present

## 2020-03-22 DIAGNOSIS — I452 Bifascicular block: Secondary | ICD-10-CM

## 2020-03-22 DIAGNOSIS — I2510 Atherosclerotic heart disease of native coronary artery without angina pectoris: Secondary | ICD-10-CM | POA: Diagnosis not present

## 2020-03-22 DIAGNOSIS — E7800 Pure hypercholesterolemia, unspecified: Secondary | ICD-10-CM | POA: Diagnosis present

## 2020-03-22 DIAGNOSIS — Z888 Allergy status to other drugs, medicaments and biological substances status: Secondary | ICD-10-CM | POA: Diagnosis not present

## 2020-03-22 DIAGNOSIS — Z20822 Contact with and (suspected) exposure to covid-19: Secondary | ICD-10-CM | POA: Diagnosis present

## 2020-03-22 DIAGNOSIS — I10 Essential (primary) hypertension: Secondary | ICD-10-CM | POA: Diagnosis present

## 2020-03-22 DIAGNOSIS — K449 Diaphragmatic hernia without obstruction or gangrene: Secondary | ICD-10-CM | POA: Diagnosis present

## 2020-03-22 DIAGNOSIS — Z9851 Tubal ligation status: Secondary | ICD-10-CM | POA: Diagnosis not present

## 2020-03-22 DIAGNOSIS — I214 Non-ST elevation (NSTEMI) myocardial infarction: Secondary | ICD-10-CM | POA: Diagnosis present

## 2020-03-22 DIAGNOSIS — I517 Cardiomegaly: Secondary | ICD-10-CM | POA: Diagnosis not present

## 2020-03-22 DIAGNOSIS — E119 Type 2 diabetes mellitus without complications: Secondary | ICD-10-CM | POA: Diagnosis not present

## 2020-03-22 DIAGNOSIS — Z951 Presence of aortocoronary bypass graft: Secondary | ICD-10-CM | POA: Diagnosis not present

## 2020-03-22 DIAGNOSIS — Z79899 Other long term (current) drug therapy: Secondary | ICD-10-CM | POA: Diagnosis not present

## 2020-03-22 DIAGNOSIS — Z01810 Encounter for preprocedural cardiovascular examination: Secondary | ICD-10-CM | POA: Diagnosis not present

## 2020-03-22 DIAGNOSIS — J939 Pneumothorax, unspecified: Secondary | ICD-10-CM | POA: Diagnosis not present

## 2020-03-22 DIAGNOSIS — J90 Pleural effusion, not elsewhere classified: Secondary | ICD-10-CM | POA: Diagnosis not present

## 2020-03-22 DIAGNOSIS — Z8249 Family history of ischemic heart disease and other diseases of the circulatory system: Secondary | ICD-10-CM | POA: Diagnosis not present

## 2020-03-22 DIAGNOSIS — K219 Gastro-esophageal reflux disease without esophagitis: Secondary | ICD-10-CM | POA: Diagnosis present

## 2020-03-22 DIAGNOSIS — R0902 Hypoxemia: Secondary | ICD-10-CM | POA: Diagnosis not present

## 2020-03-22 DIAGNOSIS — Z7982 Long term (current) use of aspirin: Secondary | ICD-10-CM | POA: Diagnosis not present

## 2020-03-22 DIAGNOSIS — D62 Acute posthemorrhagic anemia: Secondary | ICD-10-CM | POA: Diagnosis not present

## 2020-03-22 DIAGNOSIS — Z87891 Personal history of nicotine dependence: Secondary | ICD-10-CM | POA: Diagnosis not present

## 2020-03-22 DIAGNOSIS — J9811 Atelectasis: Secondary | ICD-10-CM | POA: Diagnosis not present

## 2020-03-22 DIAGNOSIS — Z9049 Acquired absence of other specified parts of digestive tract: Secondary | ICD-10-CM | POA: Diagnosis not present

## 2020-03-22 DIAGNOSIS — E1142 Type 2 diabetes mellitus with diabetic polyneuropathy: Secondary | ICD-10-CM

## 2020-03-22 DIAGNOSIS — I25110 Atherosclerotic heart disease of native coronary artery with unstable angina pectoris: Secondary | ICD-10-CM | POA: Diagnosis present

## 2020-03-22 DIAGNOSIS — E11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: Secondary | ICD-10-CM | POA: Diagnosis present

## 2020-03-22 DIAGNOSIS — Z7984 Long term (current) use of oral hypoglycemic drugs: Secondary | ICD-10-CM | POA: Diagnosis not present

## 2020-03-22 HISTORY — PX: LEFT HEART CATH AND CORONARY ANGIOGRAPHY: CATH118249

## 2020-03-22 HISTORY — DX: Non-ST elevation (NSTEMI) myocardial infarction: I21.4

## 2020-03-22 LAB — LIPID PANEL
Cholesterol: 154 mg/dL (ref 0–200)
HDL: 44 mg/dL (ref >40)
LDL Cholesterol: 87 mg/dL (ref 0–99)
Total CHOL/HDL Ratio: 3.5 RATIO
Triglycerides: 114 mg/dL (ref <150)
VLDL: 23 mg/dL (ref 0–40)

## 2020-03-22 LAB — GLUCOSE, CAPILLARY
Glucose-Capillary: 131 mg/dL — ABNORMAL HIGH (ref 70–99)
Glucose-Capillary: 131 mg/dL — ABNORMAL HIGH (ref 70–99)

## 2020-03-22 LAB — BASIC METABOLIC PANEL
Anion gap: 9 (ref 5–15)
BUN: 19 mg/dL (ref 8–23)
CO2: 25 mmol/L (ref 22–32)
Calcium: 9.7 mg/dL (ref 8.9–10.3)
Chloride: 107 mmol/L (ref 98–111)
Creatinine, Ser: 0.76 mg/dL (ref 0.44–1.00)
GFR calc Af Amer: >60 mL/min (ref >60)
GFR calc non Af Amer: >60 mL/min (ref >60)
Glucose, Bld: 103 mg/dL — ABNORMAL HIGH (ref 70–99)
Potassium: 3.7 mmol/L (ref 3.5–5.1)
Sodium: 141 mmol/L (ref 135–145)

## 2020-03-22 LAB — SARS CORONAVIRUS 2 BY RT PCR (HOSPITAL ORDER, PERFORMED IN ~~LOC~~ HOSPITAL LAB): SARS Coronavirus 2: NEGATIVE

## 2020-03-22 LAB — MAGNESIUM: Magnesium: 1.9 mg/dL (ref 1.7–2.4)

## 2020-03-22 LAB — TROPONIN I (HIGH SENSITIVITY): Troponin I (High Sensitivity): 1441 ng/L (ref <18)

## 2020-03-22 LAB — HEPARIN LEVEL (UNFRACTIONATED): Heparin Unfractionated: 0.38 IU/mL (ref 0.30–0.70)

## 2020-03-22 SURGERY — LEFT HEART CATH AND CORONARY ANGIOGRAPHY
Anesthesia: LOCAL

## 2020-03-22 MED ORDER — FENTANYL CITRATE (PF) 100 MCG/2ML IJ SOLN
INTRAMUSCULAR | Status: DC | PRN
  Administered 2020-03-22: 50 ug via INTRAVENOUS

## 2020-03-22 MED ORDER — HEPARIN BOLUS VIA INFUSION
3000.0000 [IU] | Freq: Once | INTRAVENOUS | Status: AC
  Administered 2020-03-22: 3000 [IU] via INTRAVENOUS
  Filled 2020-03-22: qty 3000

## 2020-03-22 MED ORDER — METOPROLOL TARTRATE 50 MG PO TABS
50.0000 mg | ORAL_TABLET | Freq: Every evening | ORAL | Status: DC
  Administered 2020-03-22 – 2020-03-23 (×2): 50 mg via ORAL
  Filled 2020-03-22 (×2): qty 1

## 2020-03-22 MED ORDER — SODIUM CHLORIDE 0.9 % WEIGHT BASED INFUSION: 1.0000 mL/kg/h | INTRAVENOUS | Status: DC

## 2020-03-22 MED ORDER — LABETALOL HCL 5 MG/ML IV SOLN: 10.0000 mg | INTRAVENOUS | Status: AC | PRN

## 2020-03-22 MED ORDER — IRBESARTAN 75 MG PO TABS
75.0000 mg | ORAL_TABLET | Freq: Every day | ORAL | Status: DC
  Filled 2020-03-22: qty 1

## 2020-03-22 MED ORDER — SODIUM CHLORIDE 0.9 % IV SOLN: 250.0000 mL | INTRAVENOUS | Status: DC | PRN

## 2020-03-22 MED ORDER — CLOPIDOGREL BISULFATE 75 MG PO TABS
75.0000 mg | ORAL_TABLET | Freq: Every day | ORAL | Status: DC
  Administered 2020-03-22: 75 mg via ORAL
  Filled 2020-03-22: qty 1

## 2020-03-22 MED ORDER — VERAPAMIL HCL 2.5 MG/ML IV SOLN
INTRAVENOUS | Status: AC
  Filled 2020-03-22: qty 2

## 2020-03-22 MED ORDER — HEPARIN (PORCINE) 25000 UT/250ML-% IV SOLN
900.0000 [IU]/h | INTRAVENOUS | Status: DC
  Administered 2020-03-22: 800 [IU]/h via INTRAVENOUS
  Administered 2020-03-23: 900 [IU]/h via INTRAVENOUS
  Filled 2020-03-22 (×2): qty 250

## 2020-03-22 MED ORDER — METOPROLOL TARTRATE 100 MG PO TABS
100.0000 mg | ORAL_TABLET | Freq: Every day | ORAL | Status: DC
  Administered 2020-03-23 – 2020-03-24 (×2): 100 mg via ORAL
  Filled 2020-03-22: qty 1
  Filled 2020-03-22: qty 4
  Filled 2020-03-22: qty 1

## 2020-03-22 MED ORDER — SODIUM CHLORIDE 0.9 % WEIGHT BASED INFUSION: 3.0000 mL/kg/h | INTRAVENOUS | Status: DC

## 2020-03-22 MED ORDER — ASPIRIN EC 81 MG PO TBEC
81.0000 mg | DELAYED_RELEASE_TABLET | Freq: Every evening | ORAL | Status: DC
  Administered 2020-03-23: 81 mg via ORAL
  Filled 2020-03-22 (×2): qty 1

## 2020-03-22 MED ORDER — FENTANYL CITRATE (PF) 100 MCG/2ML IJ SOLN
INTRAMUSCULAR | Status: AC
  Filled 2020-03-22: qty 2

## 2020-03-22 MED ORDER — ACETAMINOPHEN 500 MG PO TABS: 1000.0000 mg | ORAL_TABLET | Freq: Four times a day (QID) | ORAL | Status: DC | PRN

## 2020-03-22 MED ORDER — HYDRALAZINE HCL 20 MG/ML IJ SOLN: 10.0000 mg | INTRAMUSCULAR | Status: AC | PRN

## 2020-03-22 MED ORDER — PANTOPRAZOLE SODIUM 40 MG PO TBEC
40.0000 mg | DELAYED_RELEASE_TABLET | ORAL | Status: DC
  Administered 2020-03-23: 40 mg via ORAL
  Filled 2020-03-22: qty 1

## 2020-03-22 MED ORDER — NITROGLYCERIN 2 % TD OINT
1.0000 [in_us] | TOPICAL_OINTMENT | Freq: Once | TRANSDERMAL | Status: DC
  Filled 2020-03-22: qty 1

## 2020-03-22 MED ORDER — NITROGLYCERIN 0.4 MG SL SUBL: 0.4000 mg | SUBLINGUAL_TABLET | SUBLINGUAL | Status: DC | PRN

## 2020-03-22 MED ORDER — LIDOCAINE HCL (PF) 1 % IJ SOLN
INTRAMUSCULAR | Status: DC | PRN
  Administered 2020-03-22: 2 mL

## 2020-03-22 MED ORDER — SODIUM CHLORIDE 0.9 % WEIGHT BASED INFUSION: 1.0000 mL/kg/h | INTRAVENOUS | Status: AC

## 2020-03-22 MED ORDER — INSULIN ASPART 100 UNIT/ML ~~LOC~~ SOLN
0.0000 [IU] | Freq: Three times a day (TID) | SUBCUTANEOUS | Status: DC
  Administered 2020-03-22 – 2020-03-23 (×3): 1 [IU] via SUBCUTANEOUS
  Administered 2020-03-23: 2 [IU] via SUBCUTANEOUS

## 2020-03-22 MED ORDER — IRBESARTAN 150 MG PO TABS
150.0000 mg | ORAL_TABLET | Freq: Every day | ORAL | Status: DC
  Administered 2020-03-23: 150 mg via ORAL
  Filled 2020-03-22: qty 1

## 2020-03-22 MED ORDER — SODIUM CHLORIDE 0.9% FLUSH: 3.0000 mL | INTRAVENOUS | Status: DC | PRN

## 2020-03-22 MED ORDER — HEPARIN (PORCINE) IN NACL 1000-0.9 UT/500ML-% IV SOLN
INTRAVENOUS | Status: AC
  Filled 2020-03-22: qty 1000

## 2020-03-22 MED ORDER — POLYVINYL ALCOHOL 1.4 % OP SOLN: Freq: Two times a day (BID) | OPHTHALMIC | Status: DC | PRN

## 2020-03-22 MED ORDER — HEPARIN (PORCINE) IN NACL 1000-0.9 UT/500ML-% IV SOLN
INTRAVENOUS | Status: DC | PRN
  Administered 2020-03-22 (×2): 500 mL

## 2020-03-22 MED ORDER — SEMAGLUTIDE(0.25 OR 0.5MG/DOS) 2 MG/1.5ML ~~LOC~~ SOPN: 0.5000 mg | PEN_INJECTOR | SUBCUTANEOUS | Status: DC

## 2020-03-22 MED ORDER — IOHEXOL 350 MG/ML SOLN
INTRAVENOUS | Status: DC | PRN
  Administered 2020-03-22: 70 mL

## 2020-03-22 MED ORDER — HEPARIN SODIUM (PORCINE) 1000 UNIT/ML IJ SOLN
INTRAMUSCULAR | Status: AC
  Filled 2020-03-22: qty 1

## 2020-03-22 MED ORDER — ASPIRIN 81 MG PO CHEW
81.0000 mg | CHEWABLE_TABLET | ORAL | Status: AC
  Administered 2020-03-22: 81 mg via ORAL

## 2020-03-22 MED ORDER — ONDANSETRON HCL 4 MG/2ML IJ SOLN: 4.0000 mg | Freq: Four times a day (QID) | INTRAMUSCULAR | Status: DC | PRN

## 2020-03-22 MED ORDER — ATORVASTATIN CALCIUM 40 MG PO TABS
40.0000 mg | ORAL_TABLET | Freq: Every day | ORAL | Status: DC
  Administered 2020-03-23 – 2020-03-30 (×7): 40 mg via ORAL
  Filled 2020-03-22 (×7): qty 1

## 2020-03-22 MED ORDER — HEPARIN SODIUM (PORCINE) 1000 UNIT/ML IJ SOLN
INTRAMUSCULAR | Status: DC | PRN
  Administered 2020-03-22: 3500 [IU] via INTRAVENOUS

## 2020-03-22 MED ORDER — VERAPAMIL HCL 2.5 MG/ML IV SOLN
INTRAVENOUS | Status: DC | PRN
  Administered 2020-03-22: 10 mL via INTRA_ARTERIAL

## 2020-03-22 MED ORDER — LIDOCAINE HCL (PF) 1 % IJ SOLN
INTRAMUSCULAR | Status: AC
  Filled 2020-03-22: qty 30

## 2020-03-22 MED ORDER — SODIUM CHLORIDE 0.9% FLUSH
3.0000 mL | Freq: Two times a day (BID) | INTRAVENOUS | Status: DC
  Administered 2020-03-23: 3 mL via INTRAVENOUS

## 2020-03-22 MED ORDER — CANAGLIFLOZIN 100 MG PO TABS
100.0000 mg | ORAL_TABLET | Freq: Every day | ORAL | Status: DC
  Administered 2020-03-23: 100 mg via ORAL
  Filled 2020-03-22 (×3): qty 1

## 2020-03-22 MED ORDER — HEPARIN (PORCINE) 25000 UT/250ML-% IV SOLN
800.0000 [IU]/h | INTRAVENOUS | Status: DC
  Administered 2020-03-22: 800 [IU]/h via INTRAVENOUS
  Filled 2020-03-22: qty 250

## 2020-03-22 SURGICAL SUPPLY — 14 items
CATH 5FR JL3.5 JR4 ANG PIG MP (CATHETERS) ×1 IMPLANT
CATH INFINITI 5 FR 3DRC (CATHETERS) ×1 IMPLANT
CATH LAUNCHER 6FR AL.75 (CATHETERS) ×1 IMPLANT
DEVICE RAD COMP TR BAND LRG (VASCULAR PRODUCTS) ×1 IMPLANT
GLIDESHEATH SLEND SS 6F .021 (SHEATH) ×1 IMPLANT
GUIDEWIRE INQWIRE 1.5J.035X260 (WIRE) IMPLANT
INQWIRE 1.5J .035X260CM (WIRE) ×2
KIT HEART LEFT (KITS) ×2 IMPLANT
PACK CARDIAC CATHETERIZATION (CUSTOM PROCEDURE TRAY) ×2 IMPLANT
SHEATH PROBE COVER 6X72 (BAG) ×1 IMPLANT
SYR MEDRAD MARK 7 150ML (SYRINGE) ×2 IMPLANT
TRANSDUCER W/STOPCOCK (MISCELLANEOUS) ×2 IMPLANT
TUBING CIL FLEX 10 FLL-RA (TUBING) ×2 IMPLANT
WIRE HI TORQ VERSACORE-J 145CM (WIRE) ×1 IMPLANT

## 2020-03-22 NOTE — Interval H&P Note (Signed)
History and Physical Interval Note:  03/22/2020 9:43 AM  [name]  has presented today for surgery, with the diagnosis of nstemi.  The various methods of treatment have been discussed with the patient and family. After consideration of risks, benefits and other options for treatment, the patient has consented to  Procedure(s): LEFT HEART CATH AND CORONARY ANGIOGRAPHY (N/A) as a surgical intervention.  The patient's history has been reviewed, patient examined, no change in status, stable for surgery.  I have reviewed the patient's chart and labs.  Questions were answered to the patient's satisfaction.   Cath Lab Visit (complete for each Cath Lab visit)  Clinical Evaluation Leading to the Procedure:   ACS: Yes.    Non-ACS:    Anginal Classification: CCS IV  Anti-ischemic medical therapy: Maximal Therapy (2 or more classes of medications)  Non-Invasive Test Results: No non-invasive testing performed  Prior CABG: No previous CABG        Collier Salina [facility] 03/22/2020 9:43 AM

## 2020-03-22 NOTE — Progress Notes (Signed)
ANTICOAGULATION CONSULT NOTE - Initial Consult  Pharmacy Consult for heparin Indication: chest pain/ACS  Allergies  Allergen Reactions  . Bactrim [Sulfamethoxazole-Trimethoprim] Other (See Comments)    ^ K+( elevated)   . Demerol [Meperidine]     Delusional   . Scopolamine     Delusional   . Versed [Midazolam] Anxiety    Frantic, out of my mind, agitated     Patient Measurements:   Heparin Dosing Weight: 64 kg  Vital Signs: Temp: 98.2 F (36.8 C) (06/28 1812) Temp Source: Oral (06/28 1812) BP: 146/52 (06/28 2024) Pulse Rate: 76 (06/28 2024)  Labs: Recent Labs    03/21/20 1821 03/21/20 2128  HGB 13.3  --   HCT 40.8  --   PLT 260  --   CREATININE 0.93  --   TROPONINIHS 24* 553*    CrCl cannot be calculated (Unknown ideal weight.).   Medical History: Past Medical History:  Diagnosis Date  . Arthritis   . 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 Complication of anesthesia    VERSED- agitated, muscle spasms, "jerking" , frantic , (never had this occurence in the pas)   . Diabetes mellitus without complication ([place])   . GERD (gastroesophageal reflux disease)   . Heart murmur   . History of hiatal hernia   . Hyperlipidemia   . Hypertension 11/20/11   ECHO- EF>55% Borderline concentric left ventricular hypertrophy. There is a small calcified mass in the L:A near the LA appendage. No valvular masses seen with associated mitral annular calcification. LA Volume/ BSA27.4 ml/m2 No AS. Right ventricular systolic pressure is elevated at 24mmHg.  Marland Kitchen Peripheral vascular disease ([place])   . Pneumonia    not hosp.   . 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  PTCAs with drug coated balloon Lt ant tibial artery and Lt popliteal artery  11/26/2019    Medications:  See medication history  Assessment: 79 yo lady to start heparin for CP.  She was not on anticoagulation PTA.  Hg 13.3, PTLC 260   Goal of Therapy:  Heparin level 0.3-0.7 units/ml Monitor  platelets by anticoagulation protocol: Yes   Plan:  Heparin 3000 unit bolus and drip at 800 units/hr Check heparin level 6-8 hours after start Daily HL and CBC while on heparin Monitor for bleeding complications  Lauralee Evener, Mirielle Byrum [place] 03/22/2020,12:26 AM

## 2020-03-22 NOTE — Progress Notes (Signed)
TCTS consulted for CABG evaluation. °

## 2020-03-22 NOTE — Progress Notes (Signed)
ANTICOAGULATION CONSULT NOTE  Pharmacy Consult:  Heparin Indication: chest pain/ACS  Allergies  Allergen Reactions  . Bactrim [Sulfamethoxazole-Trimethoprim] Other (See Comments)    ^ K+( elevated)   . Demerol [Meperidine]     Delusional   . Scopolamine     Delusional   . Versed [Midazolam] Anxiety    Frantic, out of my mind, agitated     Patient Measurements: Weight: 64.1 kg (141 lb 5 oz) Heparin Dosing Weight: 64 kg  Vital Signs: BP: 145/38 (06/29 1432) Pulse Rate: 77 (06/29 1417)  Labs: Recent Labs    03/21/20 1821 03/21/20 2128 03/22/20 0328 03/22/20 0801  HGB 13.3  --   --   --   HCT 40.8  --   --   --   PLT 260  --   --   --   HEPARINUNFRC  --   --   --  0.38  CREATININE 0.93  --  0.76  --   TROPONINIHS 24* 553* 1,441*  --     Estimated Creatinine Clearance: 51.1 mL/min (by C-G formula based on SCr of 0.76 mg/dL).   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  Pharmacy consulted to resume IV heparin 8 hours post sheath removal.  Sheath removed around 1030 per procedural log and no bleeding/hematoma documented.  Goal of Therapy:  Heparin level 0.3-0.7 units/ml Monitor platelets by anticoagulation protocol: Yes   Plan:  At 1830, restart heparin gtt at 800 units/hr - no bolus post cath Check 8 hr heparin level Daily heparin level and CBC F/u CVTS evaluation  Jahaira Earnhart D. Mina Marble, PharmD, BCPS, [place] 03/22/2020, 2:53 PM

## 2020-03-22 NOTE — H&P (Signed)
Cardiology History & Physical    Patient ID: BUFFIE HERNE MRN: [id number], DOB/AGE: [birth year]   Admit date: 03/21/2020  Primary Physician: [name], MD Primary Cardiologist: [name], MD  Patient Profile    CHAELYN BUNYAN is a 79 year old woman with a history of type 2 DM, HTN, HLD, hiatal hernia with GERD, and extensive PAD with multiple prior interventions, most recently left SFA stent and PTCA of left anterior tibial and popliteal arteries, as well as right TMA. She presented today for evaluation of chest pain.   History of Present Illness    Today around 4:15pm after finishing her grocery shopping, coming home, and taking out the trash, she sat down and experienced pain from axilla to elbows bilaterally and tightness across her chest with radiation to the jaw. No nausea, dyspnea, or diaphoresis. This has not happened before. Blood pressure checked on her home cuff was elevated to 179/80 then 191/72, which is very unusual - typically is in 120s/50s and she has not missed any of her medications, reports perfect compliance in general. Came to the ED and while in the waiting room, the chest tightness ultimately subsided around 9:10pm and was completely gone by the time she came back to a room.   Troponins 25 -> 553. 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 Systolic pressure elevated to 175 in triage, improved since then. Took 324mg  aspirin at home, nitropaste applied in ED and started on heparin infusion.  Past Medical History   Past Medical History:  Diagnosis Date  . Arthritis   . Cancer ([place])    removal from nose - MOSE procedure   . Complication of anesthesia    VERSED- agitated, muscle spasms, "jerking" , frantic , (never had this occurence in the pas)   . Diabetes mellitus without complication ([place])   . GERD (gastroesophageal reflux disease)   . Heart murmur   . History of hiatal hernia   . Hyperlipidemia   . Hypertension 11/20/11   ECHO- EF>55%  Borderline concentric left ventricular hypertrophy. There is a small calcified mass in the L:A near the LA appendage. No valvular masses seen with associated mitral annular calcification. LA Volume/ BSA27.4 ml/m2 No AS. Right ventricular systolic pressure is elevated at 55mmHg.  Marland Kitchen Peripheral vascular disease ([place])   . Pneumonia    not hosp.   . 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  PTCAs with drug coated balloon Lt ant tibial artery and Lt popliteal artery  11/26/2019    Past Surgical History:  Procedure Laterality Date  . ABDOMINAL AORTAGRAM  11/25/2019   ABDOMINAL AORTOGRAM W/LOWER EXTREMITY   . ABDOMINAL AORTOGRAM N/A 06/15/2019   Procedure: ABDOMINAL AORTOGRAM;  Surgeon: [name], MD;  Location: [facility];  Service: Cardiovascular;  Laterality: N/A;  . ABDOMINAL AORTOGRAM W/LOWER EXTREMITY N/A 11/25/2019   Procedure: ABDOMINAL AORTOGRAM W/LOWER EXTREMITY;  Surgeon: [name], MD;  Location: [facility];  Service: Cardiovascular;  Laterality: N/A;  Lt leg  . APPENDECTOMY    . APPLICATION OF WOUND VAC Right 07/21/2019   Procedure: Application Of Prevena Wound Vac Right Groin;  Surgeon: [name], MD;  Location: [facility];  Service: Vascular;  Laterality: Right;  . CARPAL TUNNEL RELEASE    . CARPAL TUNNEL RELEASE    . CESAREAN SECTION     x 2  . COLONOSCOPY    . ENDARTERECTOMY FEMORAL Right 07/21/2019   Procedure: RIGHT ENDARTERECTOMY  FEMORAL WITH PATCH ANGIOPLASTY;  Surgeon: [name], MD;  Location: [facility] OR;  Service: Vascular;  Laterality: Right;  . INTRAVASCULAR LITHOTRIPSY  11/25/2019   Procedure: INTRAVASCULAR LITHOTRIPSY;  Surgeon: [name], MD;  Location: [facility];  Service: Cardiovascular;;  LT. SFA  . LOWER EXTREMITY ANGIOGRAPHY Bilateral 06/15/2019   Procedure: Lower Extremity Angiography;  Surgeon: [name], MD;  Location: [facility];  Service: Cardiovascular;  Laterality: Bilateral;  . LOWER  EXTREMITY ANGIOGRAPHY Right 08/03/2019   Procedure: LOWER EXTREMITY ANGIOGRAPHY;  Surgeon: [name], MD;  Location: [facility];  Service: Cardiovascular;  Laterality: Right;  . PATCH ANGIOPLASTY Right 07/21/2019   Procedure: Patch Angioplasty Right Femoral Artery;  Surgeon: [name], MD;  Location: [place];  Service: Vascular;  Laterality: Right;  . PERIPHERAL VASCULAR ATHERECTOMY  08/03/2019   Procedure: PERIPHERAL VASCULAR ATHERECTOMY;  Surgeon: [name], MD;  Location: [facility];  Service: Cardiovascular;;  right SFA, right TP trunk  . PERIPHERAL VASCULAR ATHERECTOMY  11/25/2019   Procedure: PERIPHERAL VASCULAR ATHERECTOMY;  Surgeon: [name], MD;  Location: [facility];  Service: Cardiovascular;;  Lt.  POPLITEAL and AT  . PERIPHERAL VASCULAR BALLOON ANGIOPLASTY Left 06/15/2019   Procedure: PERIPHERAL VASCULAR BALLOON ANGIOPLASTY;  Surgeon: [name], MD;  Location: [facility];  Service: Cardiovascular;  Laterality: Left;  SFA UNSUCCESSFUL UNABLE TO CROSS LESION  . PERIPHERAL VASCULAR BALLOON ANGIOPLASTY  08/03/2019   Procedure: PERIPHERAL VASCULAR BALLOON ANGIOPLASTY;  Surgeon: [name], MD;  Location: [facility];  Service: Cardiovascular;;  right SFA, Right TP trunk  . TONSILLECTOMY     and adenoidectomy  . 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/2019   Procedure: TRANSMETATARSAL AMPUTATION;  Surgeon: Trula Slade, DPM;  Location: [facility];  Service: Podiatry;  Laterality: Right;  . TUBAL LIGATION       Allergies Allergies  Allergen Reactions  . Bactrim [Sulfamethoxazole-Trimethoprim] Other (See Comments)    ^ K+( elevated)   . Demerol [Meperidine]     Delusional   . Scopolamine     Delusional   . Versed [Midazolam] Anxiety    Frantic, out of my mind, agitated     Home Medications    Medication Sig  acetaminophen (TYLENOL) 500 MG tablet Take 1,000 mg by mouth every 6 (six) hours as needed for  moderate pain or headache.  Ascorbic Acid (VITAMIN C) 1000 MG tablet Take 1,000 mg by mouth daily with supper.   aspirin EC 81 MG tablet Take 81 mg by mouth every evening.  atorvastatin (LIPITOR) 20 MG tablet Take 1 tablet by mouth once daily  calcium carbonate (TUMS - DOSED IN MG ELEMENTAL CALCIUM) 500 MG chewable tablet Chew 2 tablets by mouth daily as needed for indigestion or heartburn.  CINNAMON PO Take 1,000 mg by mouth in the morning, at noon, and at bedtime. Breakfast, Supper, & bedtime  clopidogrel (PLAVIX) 75 MG tablet Take 1 tablet by mouth once daily Patient taking differently: Take 75 mg by mouth daily.   diltiazem (CARTIA XT) 240 MG 24 hr capsule Take 1 capsule (240 mg total) by mouth daily.  glipiZIDE (GLUCOTROL XL) 5 MG 24 hr tablet Take 1 tablet by mouth once daily with breakfast  hydrochlorothiazide (HYDRODIURIL) 12.5 MG tablet Take 1 tablet (12.5 mg total) by mouth daily.  INVOKANA 100 MG TABS tablet TAKE 1 TABLET BY MOUTH BEFORE BREAKFAST  metFORMIN (GLUCOPHAGE) 1000 MG tablet  Take 1 tablet (1,000 mg total) by mouth 2 (two) times daily.  metoprolol (LOPRESSOR) 100 MG tablet Take half tab in the morning and a whole tab in the evening. Patient taking differently: Take 100 mg by mouth 2 (two) times daily. Take 1/2 tablet by mouth in the morning and 1 tablet in the evening.  OZEMPIC, 0.25 OR 0.5 MG/DOSE, 2 MG/1.5ML SOPN INJECT 0.5 MG INTO THE SKIN ONCE A WEEK  pantoprazole (PROTONIX) 40 MG tablet Take 1 tablet (40 mg total) by mouth daily. Patient taking differently: Take 40 mg by mouth every Monday, Wednesday, and Friday at 8 PM.   Probiotic Product ([employer] SENIOR PROBIOTIC PO) Take 1 capsule by mouth daily with breakfast.  telmisartan (MICARDIS) 80 MG tablet Take 1 tablet (80 mg total) by mouth daily. Patient taking differently: Take 40 mg by mouth every evening.   vitamin B-12 (CYANOCOBALAMIN) 1000 MCG tablet Take 1,000 mcg by mouth daily with supper.    Family History     Family History  Problem Relation Age of Onset  . Cancer - Prostate Father   . Cancer - Colon Father   . Stroke Mother   . Hypertension Mother   . Hyperlipidemia Mother   . Melanoma Brother    She indicated that her mother is deceased. She indicated that her father is deceased. She indicated that the status of her brother is unknown. She indicated that her maternal grandmother is deceased. She indicated that her maternal grandfather is deceased. She indicated that her paternal grandmother is deceased. She indicated that her paternal grandfather is deceased.   Social History    Social History   Socioeconomic History  . Marital status: Widowed    Spouse name: Not on file  . Number of children: Not on file  . Years of education: Not on file  . Highest education level: Not on file  Occupational History  . Not on file  Tobacco Use  . Smoking status: Former Smoker    Quit date: 03/31/1972    Years since quitting: 48.0  . Smokeless tobacco: Never Used  Vaping Use  . Vaping Use: Never used  Substance and Sexual Activity  . Alcohol use: No  . Drug use: No  . Sexual activity: Not on file  Other Topics Concern  . Not on file  Social History Narrative  . Not on file   Social Determinants of Health   Financial Resource Strain:   . Difficulty of Paying Living Expenses:   Food Insecurity:   . Worried About Charity fundraiser in the Last Year:   . Arboriculturist in the Last Year:   Transportation Needs:   . Film/video editor (Medical):   Marland Kitchen Lack of Transportation (Non-Medical):   Physical Activity:   . Days of Exercise per Week:   . Minutes of Exercise per Session:   Stress:   . Feeling of Stress :   Social Connections:   . Frequency of Communication with Friends and Family:   . Frequency of Social Gatherings with Friends and Family:   . Attends Religious Services:   . Active Member of Clubs or Organizations:   . Attends Archivist Meetings:   Marland Kitchen Marital  Status:   Intimate Partner Violence:   . Fear of Current or Ex-Partner:   . Emotionally Abused:   Marland Kitchen Physically Abused:   . Sexually Abused:      Review of Systems    A comprehensive ROS was performed  with pertinent positives and negatives noted in the HPI  Physical Exam    BP (!) 132/45 (BP Location: Right Arm)   Pulse (!) 58   Temp 98.2 F (36.8 C) (Oral)   Resp 14   SpO2 98%  General: Alert, NAD HEENT: Normal  Neck: No bruits or JVD. Lungs:  Resp regular and unlabored, CTA bilaterally. Heart: Regular rhythm, no s3, s4, or murmurs. Abdomen: Soft, non-tender, non-distended, BS +.  Extremities: Warm. Right TMA. No clubbing, cyanosis or edema. Radials 2+ and equal bilaterally. Psych: Normal affect. Neuro: Alert and oriented. No gross focal deficits. No abnormal movements.  Labs    Troponin (Point of Care Test) No results for input(s): TROPIPOC in the last 72 hours. No results for input(s): CKTOTAL, CKMB, TROPONINI in the last 72 hours. Lab Results  Component Value Date   WBC 9.7 03/21/2020   HGB 13.3 03/21/2020   HCT 40.8 03/21/2020   MCV 84.8 03/21/2020   PLT 260 03/21/2020    Recent Labs  Lab 03/22/20 0328  NA 141  K 3.7  CL 107  CO2 25  BUN 19  CREATININE 0.76  CALCIUM 9.7  GLUCOSE 103*   Lab Results  Component Value Date   CHOL 154 03/22/2020   HDL 44 03/22/2020   LDLCALC 87 03/22/2020   TRIG 114 03/22/2020   No results found for: [facility]   Radiology Studies    DG Chest 2 View  Result Date: 03/21/2020 CLINICAL DATA:  Chest pain EXAM: CHEST - 2 VIEW COMPARISON:  09/04/2019 FINDINGS: The heart size and mediastinal contours are within normal limits. Both lungs are clear. Mild scoliosis and degenerative change. IMPRESSION: No active cardiopulmonary disease. Electronically Signed   By: Donavan Foil M.D.   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  Assessment &  Plan    NSTEMI: high risk in setting of known vascular disease, prolonged chest pain episode at rest, progressive conduction disease on ECG, and rising troponin. Now chest pain-free. The patient's TIMI risk score is 4, which indicates a 20% risk of all cause mortality, new or recurrent myocardial infarction or need for urgent revascularization in the next 14 days.  - Cath lab for coronary angiography in the morning, NPO - TTE, lipids (recent A1c 6.0%) - Atorvastatin 40mg  - Continue DAPT (Plavix/ASA) - Started heparin infusion with bolus in the ED - Continue home metoprolol 100mg  AM, 50mg  PM - SL nitro prn - Monitor on telemetry, serial troponin, monitor ECG  Hypertension: mild systolic hypertension here, wide pulse pressure noted - Irbesartan 150mg  in place of telmisartan while inpatient - Metoprolol as above - Hold HCTZ and diltiazem for now pending blood pressure trends - TTE as above  Type 2 DM: well-controlled on excellent oral regimen  - Sensitive SSI TIDAC - Hold metformin and glipizide inpatient - Continue canagliflozin - Semaglutide every Monday  Other chronic medical conditions managed per outpatient plan/regimen  Nutrition: NPO for cath lab DVT ppx: therapeutic heparin  GI ppx: home PPI Advanced Care Planning: Full Code, confirmed on admission  Signed, Marykay Lex, MD 03/22/2020, 6:29 AM

## 2020-03-22 NOTE — ED Notes (Signed)
Pt reports having chest pain while at home 03/21/20 around 4:15pm, pt denies having similar pain prior to today, pt reports pain resolved around 9:30pm and denies at this time. 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

## 2020-03-22 NOTE — ED Provider Notes (Signed)
[facility] EMERGENCY DEPARTMENT Provider Note   CSN: [id number] Arrival date & time: 03/21/20  1806     History Chief Complaint  Patient presents with  . Chest Pain    RILYNNE LONSWAY is a 79 y.o. female.  HPI  HPI: A 79 year old patient with a history of peripheral artery disease, treated diabetes, hypertension and hypercholesterolemia presents for evaluation of chest pain. Initial onset of pain was approximately 3-6 hours ago. The patient's chest pain is described as heaviness/pressure/tightness and is worse with exertion. The patient's chest pain is not middle- or left-sided, is not well-localized, is not sharp and does radiate to the arms/jaw/neck. The patient does not complain of nausea and denies diaphoresis. The patient has no history of stroke, has not smoked in the past 90 days, has no relevant family history of coronary artery disease (first degree relative at less than age 59) and does not have an elevated BMI (>=30).   This is a 79 year old female with a history of hypertension, hyperlipidemia, extensive peripheral vascular disease, diabetes who presents with chest discomfort.  Patient reports that she took the trash out yesterday around 4 PM.  She came back and sat on the couch.  She noted some discomfort in the bilateral arms that began to radiate into her chest.  She states that it was pressure and discomfort but not "pain."  It also radiated into the bilateral jaws.  She states the pain continued from approximately 4:15 to 11 PM.  She took her blood pressure at home and noted it to be elevated.  She called EMS.  She did take 4 aspirin.  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  She also has a stent in the left lower extremity.  Patient is currently symptom-free.  She is not noted any lower extremity swelling.  No recent coughs or fevers.  She is  fully vaccinated against COVID-19.  Chart reviewed.  Patient's initial troponin 24 at 1821.  Repeat troponin 553 at 2128.  Prior cardiology notes reviewed.  No documentation of ischemic evaluation.  Dr. Gwenlyn Found primary cardiologist.  Past Medical History:  Diagnosis Date  . Arthritis   . Cancer ([place])    removal from nose - MOSE procedure   . Complication of anesthesia    VERSED- agitated, muscle spasms, "jerking" , frantic , (never had this occurence in the pas)   . Diabetes mellitus without complication ([place])   . GERD (gastroesophageal reflux disease)   . Heart murmur   . History of hiatal hernia   . Hyperlipidemia   . Hypertension 11/20/11   ECHO- EF>55% Borderline concentric left ventricular hypertrophy. There is a small calcified mass in the L:A near the LA appendage. No valvular masses seen with associated mitral annular calcification. LA Volume/ BSA27.4 ml/m2 No AS. Right ventricular systolic pressure is elevated at 80mmHg.  Marland Kitchen Peripheral vascular disease ([place])   . Pneumonia    not hosp.   . 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  PTCAs with drug coated balloon Lt ant tibial artery and Lt popliteal artery  11/26/2019    Patient Active Problem List   Diagnosis Date Noted  . 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  PTCAs with drug coated balloon Lt ant tibial artery and Lt popliteal artery  11/26/2019  . Gangrene of foot ([place]) 08/07/2019  . Normocytic anemia 08/07/2019  . 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 Critical lower limb ischemia 08/03/2019  . Encounter for immunization 07/09/2016  . Essential hypertension 03/31/2013  . Dyslipidemia 03/31/2013  . DM2 (diabetes mellitus, type 2) ([place]) 03/31/2013  . Overweight (BMI 25.0-29.9) 03/31/2013  . 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 PAD (peripheral artery disease) ([place]) 03/31/2013    Past Surgical History:  Procedure Laterality Date  . ABDOMINAL AORTAGRAM  11/25/2019    ABDOMINAL AORTOGRAM W/LOWER EXTREMITY   . ABDOMINAL AORTOGRAM N/A 06/15/2019   Procedure: ABDOMINAL AORTOGRAM;  Surgeon: [name], MD;  Location: [place] [facility];  Service: Cardiovascular;  Laterality: N/A;  . ABDOMINAL AORTOGRAM W/LOWER EXTREMITY N/A 11/25/2019   Procedure: ABDOMINAL AORTOGRAM W/LOWER EXTREMITY;  Surgeon: [name], MD;  Location: [place] [facility];  Service: Cardiovascular;  Laterality: N/A;  Lt leg  . APPENDECTOMY    . APPLICATION OF WOUND VAC Right 07/21/2019   Procedure: Application Of Prevena Wound Vac Right Groin;  Surgeon: [name], MD;  Location: [place];  Service: Vascular;  Laterality: Right;  . CARPAL TUNNEL RELEASE    . CARPAL TUNNEL RELEASE    . CESAREAN SECTION     x 2  . COLONOSCOPY    . ENDARTERECTOMY FEMORAL Right 07/21/2019   Procedure: RIGHT ENDARTERECTOMY FEMORAL WITH PATCH ANGIOPLASTY;  Surgeon: [name], MD;  Location: [place];  Service: Vascular;  Laterality: Right;  . INTRAVASCULAR LITHOTRIPSY  11/25/2019   Procedure: INTRAVASCULAR LITHOTRIPSY;  Surgeon: [name], MD;  Location: [place] [facility];  Service: Cardiovascular;;  LT. SFA  . LOWER EXTREMITY ANGIOGRAPHY Bilateral 06/15/2019   Procedure: Lower Extremity Angiography;  Surgeon: [name], MD;  Location: [place] [facility];  Service: Cardiovascular;  Laterality: Bilateral;  . LOWER EXTREMITY ANGIOGRAPHY Right 08/03/2019   Procedure: LOWER EXTREMITY ANGIOGRAPHY;  Surgeon: [name], MD;  Location: [place] [facility];  Service: Cardiovascular;  Laterality: Right;  . PATCH ANGIOPLASTY Right 07/21/2019   Procedure: Patch Angioplasty Right Femoral Artery;  Surgeon: [name], MD;  Location: [place];  Service: Vascular;  Laterality: Right;  . PERIPHERAL VASCULAR ATHERECTOMY  08/03/2019   Procedure: PERIPHERAL VASCULAR ATHERECTOMY;  Surgeon: [name], MD;  Location: [place] [facility];  Service: Cardiovascular;;  right SFA, right TP trunk   . PERIPHERAL VASCULAR ATHERECTOMY  11/25/2019   Procedure: PERIPHERAL VASCULAR ATHERECTOMY;  Surgeon: [name], MD;  Location: [place] [facility];  Service: Cardiovascular;;  Lt.  POPLITEAL and AT  . PERIPHERAL VASCULAR BALLOON ANGIOPLASTY Left 06/15/2019   Procedure: PERIPHERAL VASCULAR BALLOON ANGIOPLASTY;  Surgeon: [name], MD;  Location: [place] [facility];  Service: Cardiovascular;  Laterality: Left;  SFA UNSUCCESSFUL UNABLE TO CROSS LESION  . PERIPHERAL VASCULAR BALLOON ANGIOPLASTY  08/03/2019   Procedure: PERIPHERAL VASCULAR BALLOON ANGIOPLASTY;  Surgeon: [name], MD;  Location: [place] [facility];  Service: Cardiovascular;;  right SFA, Right TP trunk  . TONSILLECTOMY     and adenoidectomy  . 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/2019   Procedure: TRANSMETATARSAL AMPUTATION;  Surgeon: Trula Slade, DPM;  Location: [place];  Service: Podiatry;  Laterality: Right;  . TUBAL LIGATION       OB History   No obstetric history on file.     Family History  Problem Relation Age of Onset  . Cancer - Prostate Father   . Cancer - Colon Father   . Stroke Mother   .  Hypertension Mother   . Hyperlipidemia Mother   . Melanoma Brother     Social History   Tobacco Use  . Smoking status: Former Smoker    Quit date: 03/31/1972    Years since quitting: 48.0  . Smokeless tobacco: Never Used  Vaping Use  . Vaping Use: Never used  Substance Use Topics  . Alcohol use: No  . Drug use: No    Home Medications Prior to Admission medications   Medication Sig Start Date End Date Taking? Authorizing Provider  acetaminophen (TYLENOL) 500 MG tablet Take 1,000 mg by mouth every 6 (six) hours as needed for moderate pain or headache.    [provider]  Ascorbic Acid (VITAMIN C) 1000 MG tablet Take 1,000 mg by mouth daily with supper.     [provider]  aspirin EC 81 MG tablet Take 81 mg by mouth every evening.    [provider]   atorvastatin (LIPITOR) 20 MG tablet Take 1 tablet by mouth once daily 03/21/20   Hilty, Nadean Corwin, MD  calcium carbonate (TUMS - DOSED IN MG ELEMENTAL CALCIUM) 500 MG chewable tablet Chew 2 tablets by mouth daily as needed for indigestion or heartburn.    [provider]  CINNAMON PO Take 1,000 mg by mouth in the morning, at noon, and at bedtime. Breakfast, Supper, & bedtime    [provider]  clopidogrel (PLAVIX) 75 MG tablet Take 1 tablet by mouth once daily Patient taking differently: Take 75 mg by mouth daily.  10/23/19   [name], MD  diltiazem (CARTIA XT) 240 MG 24 hr capsule Take 1 capsule (240 mg total) by mouth daily. 09/04/18   Hilty, Nadean Corwin, MD  glipiZIDE (GLUCOTROL XL) 5 MG 24 hr tablet Take 1 tablet by mouth once daily with breakfast 01/21/20   Elayne Snare, MD  hydrochlorothiazide (HYDRODIURIL) 12.5 MG tablet Take 1 tablet (12.5 mg total) by mouth daily. 02/02/20   Elayne Snare, MD  INVOKANA 100 MG TABS tablet TAKE 1 TABLET BY MOUTH BEFORE BREAKFAST 02/21/20   Elayne Snare, MD  metFORMIN (GLUCOPHAGE) 1000 MG tablet Take 1 tablet (1,000 mg total) by mouth 2 (two) times daily. 11/28/19   Isaiah Serge, NP  metoprolol (LOPRESSOR) 100 MG tablet Take half tab in the morning and a whole tab in the evening. Patient taking differently: Take 100 mg by mouth 2 (two) times daily. Take 1/2 tablet by mouth in the morning and 1 tablet in the evening. 07/09/16   Hilty, Nadean Corwin, MD  OZEMPIC, 0.25 OR 0.5 MG/DOSE, 2 MG/1.5ML SOPN INJECT 0.5 MG INTO THE SKIN ONCE A WEEK 02/21/20   Elayne Snare, MD  pantoprazole (PROTONIX) 40 MG tablet Take 1 tablet (40 mg total) by mouth daily. Patient taking differently: Take 40 mg by mouth every Monday, Wednesday, and Friday at 8 PM.  08/05/19   Furth, Cadence H, PA-C  Probiotic Product ([employer] SENIOR PROBIOTIC PO) Take 1 capsule by mouth daily with breakfast.    [provider]  telmisartan (MICARDIS) 80 MG tablet Take 1 tablet (80 mg  total) by mouth daily. Patient taking differently: Take 40 mg by mouth every evening.  11/19/18   [name], MD  vitamin B-12 (CYANOCOBALAMIN) 1000 MCG tablet Take 1,000 mcg by mouth daily with supper.    [provider]    Allergies    Bactrim [sulfamethoxazole-trimethoprim], Demerol [meperidine], Scopolamine, and Versed [midazolam]  Review of Systems   Review of Systems  Constitutional:  Negative for fever.  Respiratory: Positive for chest tightness. Negative for cough and shortness of breath.   Cardiovascular: Negative for chest pain and leg swelling.  Gastrointestinal: Negative for abdominal pain.  Genitourinary: Negative for dysuria.  Neurological: Negative for numbness.  All other systems reviewed and are negative.   Physical Exam Updated Vital Signs BP (!) 146/52 (BP Location: Left Arm)   Pulse 76   Temp 98.2 F (36.8 C) (Oral)   Resp 16   SpO2 100%   Physical Exam Vitals and nursing note reviewed.  Constitutional:      Appearance: She is well-developed. She is not ill-appearing.  HENT:     Head: Normocephalic and atraumatic.  Eyes:     Pupils: Pupils are equal, round, and reactive to light.  Cardiovascular:     Rate and Rhythm: Normal rate and regular rhythm.     Heart sounds: Normal heart sounds.  Pulmonary:     Effort: Pulmonary effort is normal. No respiratory distress.     Breath sounds: No wheezing.  Chest:     Chest wall: No tenderness.  Abdominal:     General: Bowel sounds are normal.     Palpations: Abdomen is soft.  Musculoskeletal:     Cervical back: Neck supple.     Right lower leg: No edema.     Left lower leg: No edema.     Comments: Well-healing scar right foot, no adjacent erythema  Skin:    General: Skin is warm and dry.  Neurological:     Mental Status: She is alert and oriented to person, place, and time.  Psychiatric:        Mood and Affect: Mood normal.     ED Results / Procedures / Treatments   Labs (all labs  ordered are listed, but only abnormal results are displayed) Labs Reviewed  BASIC METABOLIC PANEL - Abnormal; Notable for the following components:      Result Value   Glucose, Bld 136 (*)    GFR calc non Af Amer 59 (*)    All other components within normal limits  TROPONIN I (HIGH SENSITIVITY) - Abnormal; Notable for the following components:   Troponin I (High Sensitivity) 24 (*)    All other components within normal limits  TROPONIN I (HIGH SENSITIVITY) - Abnormal; Notable for the following components:   Troponin I (High Sensitivity) 553 (*)    All other components within normal limits  SARS CORONAVIRUS 2 BY RT PCR (HOSPITAL ORDER, [place] [facility])  CBC    EKG EKG Interpretation  Date/Time:  Monday March 21 2020 18:03:39 EDT Ventricular Rate:  91 PR Interval:  210 QRS Duration: 138 QT Interval:  416 QTC Calculation: 511 R Axis:   -64 Text Interpretation: Sinus rhythm with 1st degree A-V block Right bundle branch block Left anterior fascicular block Bifascicular block  Left ventricular hypertrophy with repolarization abnormality ( R in aVL , Romhilt-Estes ) Cannot rule out Septal infarct , age undetermined Abnormal ECG no prior for comparison Confirmed by Thayer Jew [phone]) on 03/21/2020 11:20:15 PM   Radiology DG Chest 2 View  Result Date: 03/21/2020 CLINICAL DATA:  Chest pain EXAM: CHEST - 2 VIEW COMPARISON:  09/04/2019 FINDINGS: The heart size and mediastinal contours are within normal limits. Both lungs are clear. Mild scoliosis and degenerative change. IMPRESSION: No active cardiopulmonary disease. Electronically Signed   By: Donavan Foil M.D.   On: 03/21/2020 18:49    Procedures Procedures (including critical care  time)  CRITICAL CARE Performed by: Merryl Hacker   Total critical care time: 35 minutes  Critical care time was exclusive of separately billable procedures and treating other patients.  Critical care was necessary to  treat or prevent imminent or life-threatening deterioration.  Critical care was time spent personally by me on the following activities: development of treatment plan with patient and/or surrogate as well as nursing, discussions with consultants, evaluation of patient's response to treatment, examination of patient, obtaining history from patient or surrogate, ordering and performing treatments and interventions, ordering and review of laboratory studies, ordering and review of radiographic studies, pulse oximetry and re-evaluation of patient's condition.   Medications Ordered in ED Medications  sodium chloride flush (NS) 0.9 % injection 3 mL (has no administration in time range)  nitroGLYCERIN (NITROGLYN) 2 % ointment 1 inch (has no administration in time range)  heparin bolus via infusion 3,000 Units (has no administration in time range)  heparin ADULT infusion 100 units/mL (25000 units/272mL sodium chloride 0.45%) (has no administration in time range)    ED Course  I have reviewed the triage vital signs and the nursing notes.  Pertinent labs & imaging results that were available during my care of the patient were reviewed by me and considered in my medical decision making (see chart for details).    MDM Rules/Calculators/A&P HEAR Score: 6                         Patient presents with arm and chest pressure.  Ongoing for approximately 5 hours this evening.  Currently symptom-free.  Vital signs reviewed.  Blood pressure 146/52.  She is afebrile.  She is overall nontoxic-appearing.  History is highly suspicious for ACS.  Patient has multiple risk factors.  Would have low suspicion for PE or pneumonia.  Lab work reviewed from triage.  Patient with significant elevation in troponin from 24-553.  Heart score is 6.  Chest x-ray independently reviewed by myself.  No evidence of pneumothorax or pneumonia.  EKG shows no STEMI but does have some significant LVH and bundle branch block patterning.  No  prior for comparison.  Patient was started on heparin drip and nitro ointment was placed.  We will plan for cardiology consultation and admission for NSTEMI.  Patient will likely need cardiac catheterization.  Final Clinical Impression(s) / ED Diagnoses Final diagnoses:  NSTEMI (non-ST elevated myocardial infarction) [facility])    Rx / [place] Orders ED Discharge Orders    None       Jasminemarie Sherrard, Barbette Hair, MD 03/22/20 [phone]

## 2020-03-22 NOTE — Progress Notes (Signed)
Patient transferred from cath lab at 1645hrs.  Oriented to unit and plan of care for shift.  Right radial site with coban intact.  Site level zero. Reviewed post cath instructions with patient. Patient verbalized understanding.

## 2020-03-22 NOTE — H&P (View-Only) (Signed)
Progress Note  Patient Name: [name] Date of Encounter: 03/22/2020  [facility] Cardiologist: [name], MD   Subjective   No chest pain currently  Inpatient Medications    Scheduled Meds: . aspirin  81 mg Oral Pre-Cath  . aspirin EC  81 mg Oral QPM  . atorvastatin  40 mg Oral Daily  . canagliflozin  100 mg Oral QAC breakfast  . clopidogrel  75 mg Oral Daily  . insulin aspart  0-9 Units Subcutaneous TID WC  . irbesartan  150 mg Oral Daily  . metoprolol tartrate  100 mg Oral Q0600  . metoprolol tartrate  50 mg Oral QPM  . [START ON 03/23/2020] pantoprazole  40 mg Oral Q M,W,F-2000  . [START ON 03/28/2020] Semaglutide(0.25 or 0.5MG /DOS)  0.5067 mg Subcutaneous Q Mon  . sodium chloride flush  3 mL Intravenous Q12H  . sodium chloride flush  3 mL Intravenous Q12H   Continuous Infusions: . sodium chloride     Followed by  . sodium chloride    . heparin 800 Units/hr (03/22/20 0106)   PRN Meds: acetaminophen, nitroGLYCERIN, ondansetron (ZOFRAN) IV, polyvinyl alcohol   Vital Signs    Vitals:   03/22/20 0436 03/22/20 0500 03/22/20 0600 03/22/20 0800  BP: (!) 132/45 (!) 129/42 (!) 147/50   Pulse: (!) 58 60 69   Resp: 14 13 13    Temp:      TempSrc:      SpO2: 98% 98% 100%   Weight:    64.1 kg   No intake or output data in the 24 hours ending 03/22/20 0917 Last 3 Weights 03/22/2020 02/02/2020 01/12/2020  Weight (lbs) 141 lb 5 oz 141 lb 6.4 oz 141 lb  Weight (kg) 64.1 kg 64.139 kg 63.957 kg      Telemetry    Normal sinus rhythm- Personally Reviewed  ECG    Normal sinus rhythm, right bundle branch block- Personally Reviewed  Physical Exam   GEN: No acute distress.   Neck: No JVD Cardiac: RRR, no murmurs, rubs, or gallops.  Respiratory: Clear to auscultation bilaterally. GI: Soft, nontender, non-distended  MS: No edema; No deformity. Neuro:  Nonfocal  Psych: Normal affect   Labs    High Sensitivity Troponin:   Recent Labs  Lab 03/21/20 1821  03/21/20 2128 03/22/20 0328  TROPONINIHS 24* 553* 1,441*      Chemistry Recent Labs  Lab 03/21/20 1821 03/22/20 0328  NA 140 141  K 3.9 3.7  CL 104 107  CO2 23 25  GLUCOSE 136* 103*  BUN 21 19  CREATININE 0.93 0.76  CALCIUM 10.2 9.7  GFRNONAA 59* >60  GFRAA >60 >60  ANIONGAP 13 9     Hematology Recent Labs  Lab 03/21/20 1821  WBC 9.7  RBC 4.81  HGB 13.3  HCT 40.8  MCV 84.8  MCH 27.7  MCHC 32.6  RDW 13.2  PLT 260    BNPNo results for input(s): BNP, PROBNP in the last 168 hours.   DDimer No results for input(s): DDIMER in the last 168 hours.   Radiology    DG Chest 2 View  Result Date: 03/21/2020 CLINICAL DATA:  Chest pain EXAM: CHEST - 2 VIEW COMPARISON:  09/04/2019 FINDINGS: The heart size and mediastinal contours are within normal limits. Both lungs are clear. Mild scoliosis and degenerative change. IMPRESSION: No active cardiopulmonary disease. Electronically Signed   By: Donavan Foil M.D.   On: 03/21/2020 18:49    Cardiac Studies   Prior  lower extremity interventions performed  Patient Profile     79 y.o. female with NSTEMI  Assessment & Plan    NSTEMI: Plan for cardiac catheterization to evaluate for CAD.  Given her PAD, she is at high risk for CAD.  Further plans based on results of cardiac cath.  Currently on dual antiplatelet therapy from home.  Continue high-dose atorvastatin.  Cardiac catheterization was discussed with the patient fully. The patient understands that risks include but are not limited to stroke (1 in 1000), death (1 in 75), kidney failure [usually temporary] (1 in 500), bleeding (1 in 200), allergic reaction [possibly serious] (1 in 200).  The patient understands and is willing to proceed.     Diabetes: Sliding scale insulin as needed.  For questions or updates, please contact [place] Please consult [email or web address] for contact info under        Signed, Larae Grooms, MD  03/22/2020, 9:17 AM

## 2020-03-22 NOTE — Progress Notes (Addendum)
Progress Note  Patient Name: [name] Date of Encounter: 03/22/2020  [facility] Cardiologist: [name], MD   Subjective   No chest pain currently  Inpatient Medications    Scheduled Meds: . aspirin  81 mg Oral Pre-Cath  . aspirin EC  81 mg Oral QPM  . atorvastatin  40 mg Oral Daily  . canagliflozin  100 mg Oral QAC breakfast  . clopidogrel  75 mg Oral Daily  . insulin aspart  0-9 Units Subcutaneous TID WC  . irbesartan  150 mg Oral Daily  . metoprolol tartrate  100 mg Oral Q0600  . metoprolol tartrate  50 mg Oral QPM  . [START ON 03/23/2020] pantoprazole  40 mg Oral Q M,W,F-2000  . [START ON 03/28/2020] Semaglutide(0.25 or 0.5MG /DOS)  0.5067 mg Subcutaneous Q Mon  . sodium chloride flush  3 mL Intravenous Q12H  . sodium chloride flush  3 mL Intravenous Q12H   Continuous Infusions: . sodium chloride     Followed by  . sodium chloride    . heparin 800 Units/hr (03/22/20 0106)   PRN Meds: acetaminophen, nitroGLYCERIN, ondansetron (ZOFRAN) IV, polyvinyl alcohol   Vital Signs    Vitals:   03/22/20 0436 03/22/20 0500 03/22/20 0600 03/22/20 0800  BP: (!) 132/45 (!) 129/42 (!) 147/50   Pulse: (!) 58 60 69   Resp: 14 13 13    Temp:      TempSrc:      SpO2: 98% 98% 100%   Weight:    64.1 kg   No intake or output data in the 24 hours ending 03/22/20 0917 Last 3 Weights 03/22/2020 02/02/2020 01/12/2020  Weight (lbs) 141 lb 5 oz 141 lb 6.4 oz 141 lb  Weight (kg) 64.1 kg 64.139 kg 63.957 kg      Telemetry    Normal sinus rhythm- Personally Reviewed  ECG    Normal sinus rhythm, right bundle branch block- Personally Reviewed  Physical Exam   GEN: No acute distress.   Neck: No JVD Cardiac: RRR, no murmurs, rubs, or gallops.  Respiratory: Clear to auscultation bilaterally. GI: Soft, nontender, non-distended  MS: No edema; No deformity. Neuro:  Nonfocal  Psych: Normal affect   Labs    High Sensitivity Troponin:   Recent Labs  Lab 03/21/20 1821  03/21/20 2128 03/22/20 0328  TROPONINIHS 24* 553* 1,441*      Chemistry Recent Labs  Lab 03/21/20 1821 03/22/20 0328  NA 140 141  K 3.9 3.7  CL 104 107  CO2 23 25  GLUCOSE 136* 103*  BUN 21 19  CREATININE 0.93 0.76  CALCIUM 10.2 9.7  GFRNONAA 59* >60  GFRAA >60 >60  ANIONGAP 13 9     Hematology Recent Labs  Lab 03/21/20 1821  WBC 9.7  RBC 4.81  HGB 13.3  HCT 40.8  MCV 84.8  MCH 27.7  MCHC 32.6  RDW 13.2  PLT 260    BNPNo results for input(s): BNP, PROBNP in the last 168 hours.   DDimer No results for input(s): DDIMER in the last 168 hours.   Radiology    DG Chest 2 View  Result Date: 03/21/2020 CLINICAL DATA:  Chest pain EXAM: CHEST - 2 VIEW COMPARISON:  09/04/2019 FINDINGS: The heart size and mediastinal contours are within normal limits. Both lungs are clear. Mild scoliosis and degenerative change. IMPRESSION: No active cardiopulmonary disease. Electronically Signed   By: Donavan Foil M.D.   On: 03/21/2020 18:49    Cardiac Studies   Prior  lower extremity interventions performed  Patient Profile     79 y.o. female with NSTEMI  Assessment & Plan    NSTEMI: Plan for cardiac catheterization to evaluate for CAD.  Given her PAD, she is at high risk for CAD.  Further plans based on results of cardiac cath.  Currently on dual antiplatelet therapy from home.  Continue high-dose atorvastatin.  Cardiac catheterization was discussed with the patient fully. The patient understands that risks include but are not limited to stroke (1 in 1000), death (1 in 17), kidney failure [usually temporary] (1 in 500), bleeding (1 in 200), allergic reaction [possibly serious] (1 in 200).  The patient understands and is willing to proceed.     Diabetes: Sliding scale insulin as needed.  For questions or updates, please contact [place] Please consult [email or web address] for contact info under        Signed, Larae Grooms, MD  03/22/2020, 9:17 AM

## 2020-03-23 ENCOUNTER — Inpatient Hospital Stay (HOSPITAL_COMMUNITY): Payer: Medicare Other

## 2020-03-23 DIAGNOSIS — I25110 Atherosclerotic heart disease of native coronary artery with unstable angina pectoris: Secondary | ICD-10-CM

## 2020-03-23 DIAGNOSIS — I214 Non-ST elevation (NSTEMI) myocardial infarction: Secondary | ICD-10-CM

## 2020-03-23 DIAGNOSIS — E1159 Type 2 diabetes mellitus with other circulatory complications: Secondary | ICD-10-CM

## 2020-03-23 DIAGNOSIS — I10 Essential (primary) hypertension: Secondary | ICD-10-CM

## 2020-03-23 DIAGNOSIS — E119 Type 2 diabetes mellitus without complications: Secondary | ICD-10-CM

## 2020-03-23 DIAGNOSIS — Z01810 Encounter for preprocedural cardiovascular examination: Secondary | ICD-10-CM

## 2020-03-23 LAB — PULMONARY FUNCTION TEST
FEF 25-75 Pre: 1.6 L/sec
FEF2575-%Pred-Pre: 104 %
FEV1-%Pred-Pre: 87 %
FEV1-Pre: 1.8 L
FEV1FVC-%Pred-Pre: 105 %
FEV6-%Pred-Pre: 87 %
FEV6-Pre: 2.29 L
FEV6FVC-%Pred-Pre: 105 %
FVC-%Pred-Pre: 83 %
FVC-Pre: 2.3 L
Pre FEV1/FVC ratio: 78 %
Pre FEV6/FVC Ratio: 100 %

## 2020-03-23 LAB — URINALYSIS, ROUTINE W REFLEX MICROSCOPIC
Bilirubin Urine: NEGATIVE
Glucose, UA: >=500 mg/dL — AB
Hgb urine dipstick: NEGATIVE
Ketones, ur: NEGATIVE mg/dL
Nitrite: NEGATIVE
Protein, ur: NEGATIVE mg/dL
Specific Gravity, Urine: 1.02 (ref 1.005–1.030)
pH: 5 (ref 5.0–8.0)

## 2020-03-23 LAB — GLUCOSE, CAPILLARY
Glucose-Capillary: 115 mg/dL — ABNORMAL HIGH (ref 70–99)
Glucose-Capillary: 124 mg/dL — ABNORMAL HIGH (ref 70–99)
Glucose-Capillary: 126 mg/dL — ABNORMAL HIGH (ref 70–99)
Glucose-Capillary: 154 mg/dL — ABNORMAL HIGH (ref 70–99)
Glucose-Capillary: 220 mg/dL — ABNORMAL HIGH (ref 70–99)

## 2020-03-23 LAB — ECHOCARDIOGRAM COMPLETE
Height: 64.5 in
Weight: 2289.6 oz

## 2020-03-23 LAB — CBC
HCT: 36.1 % (ref 36.0–46.0)
Hemoglobin: 11.5 g/dL — ABNORMAL LOW (ref 12.0–15.0)
MCH: 27.4 pg (ref 26.0–34.0)
MCHC: 31.9 g/dL (ref 30.0–36.0)
MCV: 86.2 fL (ref 80.0–100.0)
Platelets: 205 10*3/uL (ref 150–400)
RBC: 4.19 MIL/uL (ref 3.87–5.11)
RDW: 13.2 % (ref 11.5–15.5)
WBC: 7.4 10*3/uL (ref 4.0–10.5)
nRBC: 0 % (ref 0.0–0.2)

## 2020-03-23 LAB — PLATELET INHIBITION P2Y12: Platelet Function  P2Y12: 199 [PRU] (ref 182–335)

## 2020-03-23 LAB — SURGICAL PCR SCREEN
MRSA, PCR: NEGATIVE
Staphylococcus aureus: NEGATIVE

## 2020-03-23 LAB — HEPARIN LEVEL (UNFRACTIONATED)
Heparin Unfractionated: 0.21 IU/mL — ABNORMAL LOW (ref 0.30–0.70)
Heparin Unfractionated: 0.3 IU/mL (ref 0.30–0.70)

## 2020-03-23 MED ORDER — BISACODYL 5 MG PO TBEC
5.0000 mg | DELAYED_RELEASE_TABLET | Freq: Once | ORAL | Status: AC
  Administered 2020-03-23: 5 mg via ORAL
  Filled 2020-03-23: qty 1

## 2020-03-23 MED ORDER — SODIUM CHLORIDE 0.9 % IV SOLN
INTRAVENOUS | Status: DC
  Filled 2020-03-23: qty 30

## 2020-03-23 MED ORDER — PLASMA-LYTE 148 IV SOLN
INTRAVENOUS | Status: DC
  Filled 2020-03-23: qty 2.5

## 2020-03-23 MED ORDER — TRANEXAMIC ACID (OHS) PUMP PRIME SOLUTION
2.0000 mg/kg | INTRAVENOUS | Status: DC
  Filled 2020-03-23: qty 1.3

## 2020-03-23 MED ORDER — NOREPINEPHRINE 4 MG/250ML-% IV SOLN
0.0000 ug/min | INTRAVENOUS | Status: DC
  Filled 2020-03-23: qty 250

## 2020-03-23 MED ORDER — TRANEXAMIC ACID (OHS) BOLUS VIA INFUSION
15.0000 mg/kg | INTRAVENOUS | Status: AC
  Administered 2020-03-24: 973.5 mg via INTRAVENOUS
  Filled 2020-03-23: qty 974

## 2020-03-23 MED ORDER — TRANEXAMIC ACID 1000 MG/10ML IV SOLN
1.5000 mg/kg/h | INTRAVENOUS | Status: AC
  Administered 2020-03-24: 1.5 mg/kg/h via INTRAVENOUS
  Filled 2020-03-23: qty 25

## 2020-03-23 MED ORDER — POTASSIUM CHLORIDE 2 MEQ/ML IV SOLN
80.0000 meq | INTRAVENOUS | Status: DC
  Filled 2020-03-23: qty 40

## 2020-03-23 MED ORDER — SODIUM CHLORIDE 0.9 % IV SOLN
750.0000 mg | INTRAVENOUS | Status: AC
  Administered 2020-03-24: 750 mg via INTRAVENOUS
  Filled 2020-03-23: qty 750

## 2020-03-23 MED ORDER — NITROGLYCERIN IN D5W 200-5 MCG/ML-% IV SOLN
2.0000 ug/min | INTRAVENOUS | Status: AC
  Administered 2020-03-24: 5 ug/min via INTRAVENOUS
  Filled 2020-03-23: qty 250

## 2020-03-23 MED ORDER — CHLORHEXIDINE GLUCONATE CLOTH 2 % EX PADS
6.0000 | MEDICATED_PAD | Freq: Once | CUTANEOUS | Status: AC
  Administered 2020-03-24: 6 via TOPICAL

## 2020-03-23 MED ORDER — INSULIN REGULAR(HUMAN) IN NACL 100-0.9 UT/100ML-% IV SOLN
INTRAVENOUS | Status: AC
  Administered 2020-03-24: 3 [IU]/h via INTRAVENOUS
  Filled 2020-03-23: qty 100

## 2020-03-23 MED ORDER — METOPROLOL TARTRATE 12.5 MG HALF TABLET: 12.5000 mg | ORAL_TABLET | Freq: Once | ORAL | Status: AC

## 2020-03-23 MED ORDER — SODIUM CHLORIDE 0.9 % IV SOLN
1.5000 g | INTRAVENOUS | Status: AC
  Administered 2020-03-24: 1.5 g via INTRAVENOUS
  Filled 2020-03-23: qty 1.5

## 2020-03-23 MED ORDER — MILRINONE LACTATE IN DEXTROSE 20-5 MG/100ML-% IV SOLN
0.3000 ug/kg/min | INTRAVENOUS | Status: DC
  Filled 2020-03-23: qty 100

## 2020-03-23 MED ORDER — CHLORHEXIDINE GLUCONATE CLOTH 2 % EX PADS
6.0000 | MEDICATED_PAD | Freq: Once | CUTANEOUS | Status: AC
  Administered 2020-03-23: 6 via TOPICAL

## 2020-03-23 MED ORDER — TEMAZEPAM 15 MG PO CAPS
15.0000 mg | ORAL_CAPSULE | Freq: Once | ORAL | Status: DC | PRN
  Filled 2020-03-23: qty 1

## 2020-03-23 MED ORDER — DEXMEDETOMIDINE HCL IN NACL 400 MCG/100ML IV SOLN
0.1000 ug/kg/h | INTRAVENOUS | Status: AC
  Administered 2020-03-24: .2 ug/kg/h via INTRAVENOUS
  Filled 2020-03-23: qty 100

## 2020-03-23 MED ORDER — MAGNESIUM SULFATE 50 % IJ SOLN
40.0000 meq | INTRAMUSCULAR | Status: DC
  Filled 2020-03-23: qty 9.85

## 2020-03-23 MED ORDER — EPINEPHRINE HCL 5 MG/250ML IV SOLN IN NS
0.0000 ug/min | INTRAVENOUS | Status: DC
  Filled 2020-03-23: qty 250

## 2020-03-23 MED ORDER — VANCOMYCIN HCL 1250 MG/250ML IV SOLN
1250.0000 mg | INTRAVENOUS | Status: AC
  Administered 2020-03-24: 1250 mg via INTRAVENOUS
  Filled 2020-03-23: qty 250

## 2020-03-23 MED ORDER — PHENYLEPHRINE HCL-NACL 20-0.9 MG/250ML-% IV SOLN
30.0000 ug/min | INTRAVENOUS | Status: AC
  Administered 2020-03-24: 20 ug/min via INTRAVENOUS
  Filled 2020-03-23: qty 250

## 2020-03-23 MED ORDER — CHLORHEXIDINE GLUCONATE 0.12 % MT SOLN
15.0000 mL | Freq: Once | OROMUCOSAL | Status: AC
  Administered 2020-03-24: 15 mL via OROMUCOSAL
  Filled 2020-03-23: qty 15

## 2020-03-23 NOTE — Progress Notes (Addendum)
Progress Note  Patient Name: [name] Date of Encounter: 03/23/2020  [facility] Cardiologist: [name], MD   Subjective   Feeling well. No chest pain, sob or palpitations.   Inpatient Medications    Scheduled Meds: . aspirin EC  81 mg Oral QPM  . atorvastatin  40 mg Oral Daily  . canagliflozin  100 mg Oral QAC breakfast  . insulin aspart  0-9 Units Subcutaneous TID WC  . irbesartan  150 mg Oral Daily  . metoprolol tartrate  100 mg Oral Q0600  . metoprolol tartrate  50 mg Oral QPM  . pantoprazole  40 mg Oral Q M,W,F-2000  . [START ON 03/28/2020] Semaglutide(0.25 or 0.5MG /DOS)  0.5067 mg Subcutaneous Q Mon  . sodium chloride flush  3 mL Intravenous Q12H   Continuous Infusions: . sodium chloride    . heparin 900 Units/hr (03/23/20 0600)   PRN Meds: sodium chloride, acetaminophen, nitroGLYCERIN, ondansetron (ZOFRAN) IV, polyvinyl alcohol, sodium chloride flush   Vital Signs    Vitals:   03/23/20 0038 03/23/20 0426 03/23/20 0601 03/23/20 0732  BP: (!) 143/64 (!) 148/65 (!) 149/53 (!) 119/45  Pulse: (!) 57 83 81 68  Resp: 15 16  16   Temp: 98.2 F (36.8 C) 98.3 F (36.8 C)  97.6 F (36.4 C)  TempSrc: Oral Oral  Oral  SpO2: 99% 100%  99%  Weight:  64.9 kg    Height:        Intake/Output Summary (Last 24 hours) at 03/23/2020 0808 Last data filed at 03/22/2020 1829 Gross per 24 hour  Intake 1217.94 ml  Output --  Net 1217.94 ml   Last 3 Weights 03/23/2020 03/22/2020 02/02/2020  Weight (lbs) 143 lb 1.6 oz 141 lb 5 oz 141 lb 6.4 oz  Weight (kg) 64.91 kg 64.1 kg 64.139 kg      Telemetry    SR with PVCS - Personally Reviewed  ECG    No new tracing  Physical Exam   GEN: No acute distress.   Neck: No JVD Cardiac: RRR, no murmurs, rubs, or gallops. R wrist with ACE warp without hematoma.  Respiratory: Clear to auscultation bilaterally. GI: Soft, nontender, non-distended  MS: No edema; No deformity. Neuro:  Nonfocal  Psych: Normal affect   Labs     High Sensitivity Troponin:   Recent Labs  Lab 03/21/20 1821 03/21/20 2128 03/22/20 0328  TROPONINIHS 24* 553* 1,441*      Chemistry Recent Labs  Lab 03/21/20 1821 03/22/20 0328  NA 140 141  K 3.9 3.7  CL 104 107  CO2 23 25  GLUCOSE 136* 103*  BUN 21 19  CREATININE 0.93 0.76  CALCIUM 10.2 9.7  GFRNONAA 59* >60  GFRAA >60 >60  ANIONGAP 13 9     Hematology Recent Labs  Lab 03/21/20 1821 03/23/20 0411  WBC 9.7 7.4  RBC 4.81 4.19  HGB 13.3 11.5*  HCT 40.8 36.1  MCV 84.8 86.2  MCH 27.7 27.4  MCHC 32.6 31.9  RDW 13.2 13.2  PLT 260 205   Radiology    DG Chest 2 View  Result Date: 03/21/2020 CLINICAL DATA:  Chest pain EXAM: CHEST - 2 VIEW COMPARISON:  09/04/2019 FINDINGS: The heart size and mediastinal contours are within normal limits. Both lungs are clear. Mild scoliosis and degenerative change. IMPRESSION: No active cardiopulmonary disease. Electronically Signed   By: Donavan Foil M.D.   On: 03/21/2020 18:49   CARDIAC CATHETERIZATION  Result Date: 03/22/2020  Ost LM to [facility]  LM lesion is 45% stenosed.  Prox LAD to Mid LAD lesion is 90% stenosed.  Mid LAD lesion is 75% stenosed.  Dist LAD lesion is 70% stenosed.  Ost Cx to Prox Cx lesion is 95% stenosed.  Prox Cx to Mid Cx lesion is 99% stenosed.  2nd Mrg lesion is 85% stenosed.  1st Mrg lesion is 95% stenosed.  Ost RCA lesion is 50% stenosed.  Mid RCA to Dist RCA lesion is 90% stenosed.  RPDA lesion is 70% stenosed.  The left ventricular systolic function is normal.  LV end diastolic pressure is mildly elevated.  The left ventricular ejection fraction is 55-65% by visual estimate.  1. Severe 3 vessel obstructive CAD. 2. Normal LV function 3. Elevated LVEDP 23 mm Hg Plan: complex multivessel disease in a diabetic. Recommend consideration for CABG. Will hold Plavix. Resume IV heparin post cath.    Cardiac Studies   LEFT HEART CATH AND CORONARY ANGIOGRAPHY  Conclusion    Ost LM to Mid LM lesion is  45% stenosed.  Prox LAD to Mid LAD lesion is 90% stenosed.  Mid LAD lesion is 75% stenosed.  Dist LAD lesion is 70% stenosed.  Ost Cx to Prox Cx lesion is 95% stenosed.  Prox Cx to Mid Cx lesion is 99% stenosed.  2nd Mrg lesion is 85% stenosed.  1st Mrg lesion is 95% stenosed.  Ost RCA lesion is 50% stenosed.  Mid RCA to Dist RCA lesion is 90% stenosed.  RPDA lesion is 70% stenosed.  The left ventricular systolic function is normal.  LV end diastolic pressure is mildly elevated.  The left ventricular ejection fraction is 55-65% by visual estimate.   1. Severe 3 vessel obstructive CAD. 2. Normal LV function 3. 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 Recommend consideration for CABG. Will hold Plavix. Resume IV heparin post cath.   Diagnostic Dominance: Right    Patient Profile     79 y.o. female with a history of type 2 DM, HTN, HLD, hiatal hernia with GERD, and extensive PAD with multiple prior interventions, most recently left SFA stent and PTCA of left anterior tibial and popliteal arteries, as well as right TMA who presented for evaluation of chest pain and found to have elevated troponin.   Assessment & Plan    1. NSTEMI - Hs-troponin peaked at 1441. Cath showed severe 3 vessel obstructive CAD as above. CABG planned tentatively for 03/24/20. She was on DAPT with ASA and Plavix at home 2nd to PAD. Her last dose of Plavix on 03/22/20. P2Y12 199 this morning.  - Continue ASA, statin,  and BB.  - Continue IV heparin  2. HLD - 03/22/2020: Cholesterol 154; HDL 44; LDL Cholesterol 87; Triglycerides 114; VLDL 23  - Lipitor increase to 40mg  qd from 20mg  qd this admission  3. HTN - BP improved on current medications  4. DM - Continue current therapy  - HgbA1c was 6 on 01/26/20  5. PAD - Plavix on hold for surgery  - Continue ASA and statin   For questions or updates, please contact [place] Please consult [email or web address] for  contact info under    I have examined the patient and reviewed assessment and plan and discussed with patient.  Agree with above as stated.  Feels well.  No chest discomfort.  No problems with right wrist.  Plan is for bypass surgery tomorrow.  She will need aggressive secondary prevention including high-dose lipid-lowering therapy, aggressive blood pressure control  and blood sugar management.  We will follow postoperatively.  [address]     Signed, [place], [place]  03/23/2020, 8:08 AM

## 2020-03-23 NOTE — Progress Notes (Signed)
Pre-CABG Dopplers completed. Refer to "CV Proc" under chart review to view preliminary results.  03/23/2020 10:36 AM Kelby Aline., MHA, RVT, RDCS, RDMS

## 2020-03-23 NOTE — Consult Note (Signed)
TCTS Preliminary Consult Consult received to consider CABG. 79 yo lady with NSTEMI. Cath shows severe multivessel CAD; agree that anatomically best option is for CABG. The completion of work-up is pending; tentatively plan for 03/24/20. Full report to follow. Yuki Brunsman Z. Orvan Seen, [place]

## 2020-03-23 NOTE — Progress Notes (Signed)
ANTICOAGULATION CONSULT NOTE  Pharmacy Consult:  Heparin Indication: chest pain/ACS  Allergies  Allergen Reactions  . Bactrim [Sulfamethoxazole-Trimethoprim] Other (See Comments)    ^ K+( elevated)   . Demerol [Meperidine]     Delusional   . Scopolamine     Delusional   . Versed [Midazolam] Anxiety    Frantic, out of my mind, agitated     Patient Measurements: Height: 5' 4.5" (163.8 cm) Weight: 64.9 kg (143 lb 1.6 oz) IBW/kg (Calculated) : 55.85 Heparin Dosing Weight: 64 kg  Vital Signs: Temp: 98.3 F (36.8 C) (06/30 1102) Temp Source: Oral (06/30 1102) BP: 109/41 (06/30 1102) Pulse Rate: 63 (06/30 1102)  Labs: Recent Labs    03/21/20 1821 03/21/20 2128 03/22/20 0328 03/22/20 0801 03/23/20 0411 03/23/20 1232  HGB 13.3  --   --   --  11.5*  --   HCT 40.8  --   --   --  36.1  --   PLT 260  --   --   --  205  --   HEPARINUNFRC  --   --   --  0.38 0.21* 0.30  CREATININE 0.93  --  0.76  --   --   --   TROPONINIHS 24* 553* 1,441*  --   --   --     Estimated Creatinine Clearance: 51.1 mL/min (by C-G formula based on SCr of 0.76 mg/dL).   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  Pharmacy consulted to resume IV heparin.   Follow up heparin level this afternoon is at low end of goal at 0.3 on 900 units/hr. No bleeding issues noted. Will increase rate slightly to keep within range. Plan for surgery in am. P2y12 test this am was 199.   Goal of Therapy:  Heparin level 0.3-0.7 units/ml Monitor platelets by anticoagulation protocol: Yes   Plan:  Increase heparin to 950 units/hr Heparin level with am labs  Erin Hearing PharmD., BCPS Clinical Pharmacist 03/23/2020 1:28 PM

## 2020-03-23 NOTE — Consult Note (Signed)
[address]       [place] [address]             [phone]        Kileen H [facility] Medical Record #[id number] Date of Birth: Dec 07, 1940  Referring: No ref. provider found Primary Care: [name], MD Primary Cardiologist:Jonathan Gwenlyn Found, MD  Chief Complaint:    Chief Complaint  Patient presents with  . Chest Pain    History of Present Illness:      79 yo lady with PMHx significant for PVD, HTN, HL, and DM presented with new-onset CP. This began in the B UEs and radiated to neck/jaw. At the time, she was severely hypertensive. She was taken to ED by EMS where NSTEMI dx'd. 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 Referred for CABG consideration. Has been CP free on heparin drip.   Current Activity/ Functional Status: Patient will be independent with mobility/ambulation, transfers, ADL's, IADL's.   Zubrod Score: At the time of surgery this patient's most appropriate activity status/level should be described as: []    0    Normal activity, no symptoms []    1    Restricted in physical strenuous activity but ambulatory, able to do out light work []    2    Ambulatory and capable of self care, unable to do work activities, up and about                 more than 50%  Of the time                            []    3    Only limited self care, in bed greater than 50% of waking hours []    4    Completely disabled, no self care, confined to bed or chair []    5    Moribund  Past Medical History:  Diagnosis Date  . Arthritis   . Cancer ([place])    removal from nose - MOSE procedure   . Complication of anesthesia    VERSED- agitated, muscle spasms, "jerking" , frantic , (never had this occurence in the pas)   . Diabetes mellitus without complication ([place])   . GERD (gastroesophageal reflux disease)   . Heart murmur   . History of hiatal hernia   . Hyperlipidemia   . Hypertension 11/20/11   ECHO- EF>55%  Borderline concentric left ventricular hypertrophy. There is a small calcified mass in the L:A near the LA appendage. No valvular masses seen with associated mitral annular calcification. LA Volume/ BSA27.4 ml/m2 No AS. Right ventricular systolic pressure is elevated at 80mHg.  .Marland KitchenPeripheral vascular disease ([place]   . Pneumonia    not hosp.   . 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  PTCAs with drug coated balloon Lt ant tibial artery and Lt popliteal artery  11/26/2019    Past Surgical History:  Procedure Laterality Date  . ABDOMINAL AORTAGRAM  11/25/2019   ABDOMINAL AORTOGRAM W/LOWER EXTREMITY   . ABDOMINAL AORTOGRAM N/A 06/15/2019   Procedure: ABDOMINAL AORTOGRAM;  Surgeon: BLorretta Harp MD;  [facility];  Service: Cardiovascular;  Laterality: N/A;  . ABDOMINAL AORTOGRAM W/LOWER EXTREMITY N/A 11/25/2019   Procedure: ABDOMINAL AORTOGRAM W/LOWER EXTREMITY;  Surgeon: AWellington Hampshire MD;  [facility];  Service: Cardiovascular;  Laterality: N/A;  Lt leg  . APPENDECTOMY    . APPLICATION OF WOUND VAC Right 07/21/2019   Procedure: Application Of Prevena Wound Vac Right Groin;  Surgeon: [name], MD;  Location: [facility];  Service: Vascular;  Laterality: Right;  . CARPAL TUNNEL RELEASE    . CARPAL TUNNEL RELEASE    . CESAREAN SECTION     x 2  . COLONOSCOPY    . ENDARTERECTOMY FEMORAL Right 07/21/2019   Procedure: RIGHT ENDARTERECTOMY FEMORAL WITH PATCH ANGIOPLASTY;  Surgeon: [name], MD;  Location: [facility];  Service: Vascular;  Laterality: Right;  . INTRAVASCULAR LITHOTRIPSY  11/25/2019   Procedure: INTRAVASCULAR LITHOTRIPSY;  Surgeon: [name], MD;  Location: [facility];  Service: Cardiovascular;;  LT. SFA  . LEFT HEART CATH AND CORONARY ANGIOGRAPHY N/A 03/22/2020   Procedure: LEFT HEART CATH AND CORONARY ANGIOGRAPHY;  Surgeon: Martinique, Peter M, MD;  Location: [facility];  Service: Cardiovascular;  Laterality:  N/A;  . LOWER EXTREMITY ANGIOGRAPHY Bilateral 06/15/2019   Procedure: Lower Extremity Angiography;  Surgeon: [name], MD;  Location: [facility];  Service: Cardiovascular;  Laterality: Bilateral;  . LOWER EXTREMITY ANGIOGRAPHY Right 08/03/2019   Procedure: LOWER EXTREMITY ANGIOGRAPHY;  Surgeon: [name], MD;  Location: [facility];  Service: Cardiovascular;  Laterality: Right;  . PATCH ANGIOPLASTY Right 07/21/2019   Procedure: Patch Angioplasty Right Femoral Artery;  Surgeon: [name], MD;  Location: [facility];  Service: Vascular;  Laterality: Right;  . PERIPHERAL VASCULAR ATHERECTOMY  08/03/2019   Procedure: PERIPHERAL VASCULAR ATHERECTOMY;  Surgeon: [name], MD;  Location: [facility];  Service: Cardiovascular;;  right SFA, right TP trunk  . PERIPHERAL VASCULAR ATHERECTOMY  11/25/2019   Procedure: PERIPHERAL VASCULAR ATHERECTOMY;  Surgeon: [name], MD;  Location: [facility];  Service: Cardiovascular;;  Lt.  POPLITEAL and AT  . PERIPHERAL VASCULAR BALLOON ANGIOPLASTY Left 06/15/2019   Procedure: PERIPHERAL VASCULAR BALLOON ANGIOPLASTY;  Surgeon: [name], MD;  Location: [facility];  Service: Cardiovascular;  Laterality: Left;  SFA UNSUCCESSFUL UNABLE TO CROSS LESION  . PERIPHERAL VASCULAR BALLOON ANGIOPLASTY  08/03/2019   Procedure: PERIPHERAL VASCULAR BALLOON ANGIOPLASTY;  Surgeon: [name], MD;  Location: [facility];  Service: Cardiovascular;;  right SFA, Right TP trunk  . TONSILLECTOMY     and adenoidectomy  . 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 TUBAL LIGATION      Social History   Tobacco Use  Smoking Status Former Smoker  . Quit date: 03/31/1972  . Years since quitting: 48.0  Smokeless Tobacco Never Used    Social History   Substance and  Sexual Activity  Alcohol Use No     Allergies  Allergen Reactions  . Bactrim [Sulfamethoxazole-Trimethoprim] Other (See Comments)    ^ K+( elevated)   . Demerol [Meperidine]     Delusional   . Scopolamine     Delusional   . Versed [Midazolam] Anxiety    Frantic, out of my mind, agitated     Current Facility-Administered Medications  Medication Dose Route Frequency Provider Last Rate Last Admin  . 0.9 %  sodium chloride infusion  250 mL Intravenous PRN Martinique, Peter M, MD      . acetaminophen (TYLENOL) tablet 1,000 mg  1,000 mg Oral Q6H  PRN Martinique, Peter M, MD      . aspirin EC tablet 81 mg  81 mg Oral QPM Martinique, Peter M, MD   81 mg at 03/23/20 1809  . atorvastatin (LIPITOR) tablet 40 mg  40 mg Oral Daily Martinique, Peter M, MD   40 mg at 03/23/20 3299  . canagliflozin (INVOKANA) tablet 100 mg  100 mg Oral QAC breakfast Martinique, Peter M, MD   100 mg at 03/23/20 2426  . [START ON 03/24/2020] cefUROXime (ZINACEF) 1.5 g in sodium chloride 0.9 % 100 mL IVPB  1.5 g Intravenous To OR Nycole Kawahara, Glenice Bow, MD      . Derrill Memo ON 03/24/2020] cefUROXime (ZINACEF) 750 mg in sodium chloride 0.9 % 100 mL IVPB  750 mg Intravenous To OR Moya Duan, Glenice Bow, MD      . Derrill Memo ON 03/24/2020] chlorhexidine (PERIDEX) 0.12 % solution 15 mL  15 mL Mouth/Throat Once Dhilan Brauer, Glenice Bow, MD      . Chlorhexidine Gluconate Cloth 2 % PADS 6 each  6 each Topical Once [name], MD       And  . Chlorhexidine Gluconate Cloth 2 % PADS 6 each  6 each Topical Once [name], MD      . Derrill Memo ON 03/24/2020] dexmedetomidine (PRECEDEX) 400 MCG/100ML (4 mcg/mL) infusion  0.1-0.7 mcg/kg/hr Intravenous To OR Dominie Benedick, Glenice Bow, MD      . Derrill Memo ON 03/24/2020] EPINEPHrine (ADRENALIN) 4 mg in NS 250 mL (0.016 mg/mL) premix infusion  0-10 mcg/min Intravenous To OR [name], MD      . Derrill Memo ON 03/24/2020] heparin 30,000 units/NS 1000 mL solution for CELLSAVER   Other To OR Tamera Pingley Z, MD      . heparin ADULT infusion  100 units/mL (25000 units/269m sodium chloride 0.45%)  900 Units/hr Intravenous Continuous BLaren Everts RPH 9 mL/hr at 03/23/20 1458 900 Units/hr at 03/23/20 1458  . [START ON 03/24/2020] heparin sodium (porcine) 2,500 Units, papaverine 30 mg in electrolyte-148 (PLASMALYTE-148) 500 mL irrigation   Irrigation To OR Evann Erazo Z, MD      . insulin aspart (novoLOG) injection 0-9 Units  0-9 Units Subcutaneous TID WC JMartinique Peter M, MD   1 Units at 03/23/20 1808  . [START ON 03/24/2020] insulin regular, human (MYXREDLIN) 100 units/ 100 mL infusion   Intravenous To OR Tamisha Nordstrom, BGlenice Bow MD      . irbesartan (AVAPRO) tablet 150 mg  150 mg Oral Daily JMartinique Peter M, MD   150 mg at 03/23/20 0829  . [START ON 03/24/2020] magnesium sulfate (IV Push/IM) injection 40 mEq  40 mEq Other To OR Doralee Kocak, BGlenice Bow MD      . metoprolol tartrate (LOPRESSOR) tablet 100 mg  100 mg Oral Q0600 JMartinique Peter M, MD   100 mg at 03/23/20 0601  . [START ON 03/24/2020] metoprolol tartrate (LOPRESSOR) tablet 12.5 mg  12.5 mg Oral Once Donalee Gaumond, BGlenice Bow MD      . metoprolol tartrate (LOPRESSOR) tablet 50 mg  50 mg Oral QPM JMartinique Peter M, MD   50 mg at 03/23/20 1809  . [START ON 03/24/2020] milrinone (PRIMACOR) 20 MG/100 ML (0.2 mg/mL) infusion  0.3 mcg/kg/min Intravenous To OR Dalayla Aldredge Z, MD      . nitroGLYCERIN (NITROSTAT) SL tablet 0.4 mg  0.4 mg Sublingual Q5 Min x 3 PRN JMartinique Peter M, MD      . [Derrill MemoON 03/24/2020] nitroGLYCERIN 50 mg in dextrose 5 % 250  mL (0.2 mg/mL) infusion  2-200 mcg/min Intravenous To OR Atkins, Glenice Bow, MD      . Derrill Memo ON 03/24/2020] norepinephrine (LEVOPHED) 75m in 2534mpremix infusion  0-40 mcg/min Intravenous To OR Atkins, BrGlenice BowMD      . ondansetron (ZOFRAN) injection 4 mg  4 mg Intravenous Q6H PRN JoMartiniquePeter M, MD      . pantoprazole (PROTONIX) EC tablet 40 mg  40 mg Oral Q M,W,F-2000 JoMartiniquePeter M, MD      . [SDerrill MemoN 03/24/2020] phenylephrine (NEOSYNEPHRINE) 20-0.9 MG/250ML-%  infusion  30-200 mcg/min Intravenous To OR Atkins, BrGlenice BowMD      . polyvinyl alcohol (LIQUIFILM TEARS) 1.4 % ophthalmic solution   Both Eyes BID PRN JoMartiniquePeter M, MD      . [SDerrill MemoN 03/24/2020] potassium chloride injection 80 mEq  80 mEq Other To OR Atkins, Broadus Z, MD      . sodium chloride flush (NS) 0.9 % injection 3 mL  3 mL Intravenous Q12H JoMartiniquePeter M, MD   3 mL at 03/23/20 0830  . sodium chloride flush (NS) 0.9 % injection 3 mL  3 mL Intravenous PRN JoMartiniquePeter M, MD      . temazepam (RESTORIL) capsule 15 mg  15 mg Oral Once PRN AtWonda OldsMD      . [SDerrill MemoN 03/24/2020] tranexamic acid (CYKLOKAPRON) 2,500 mg in sodium chloride 0.9 % 250 mL (10 mg/mL) infusion  1.5 mg/kg/hr Intravenous To OR AtWonda OldsMD      . [SDerrill MemoN 03/24/2020] tranexamic acid (CYKLOKAPRON) bolus via infusion - over 30 minutes 973.5 mg  15 mg/kg Intravenous To OR AtWonda OldsMD      . [SDerrill MemoN 03/24/2020] tranexamic acid (CYKLOKAPRON) pump prime solution 130 mg  2 mg/kg Intracatheter To OR Atkins, BrGlenice BowMD      . [SDerrill MemoN 03/24/2020] vancomycin (VANCOREADY) IVPB 1250 mg/250 mL  1,250 mg Intravenous To OR Atkins, BrGlenice BowMD        Facility-Administered Medications Prior to Admission  Medication Dose Route Frequency Provider Last Rate Last Admin  . sodium chloride flush (NS) 0.9 % injection 3 mL  3 mL Intravenous Q12H BeLorretta HarpMD      . sodium chloride flush (NS) 0.9 % injection 3 mL  3 mL Intravenous Q12H BeLorretta HarpMD       Medications Prior to Admission  Medication Sig Dispense Refill Last Dose  . acetaminophen (TYLENOL) 500 MG tablet Take 1,000 mg by mouth every 6 (six) hours as needed for moderate pain or headache.   unk  . Ascorbic Acid (VITAMIN C) 1000 MG tablet Take 1,000 mg by mouth daily with supper.    03/20/2020  . aspirin EC 81 MG tablet Take 81 mg by mouth every evening.   03/21/2020 at Unknown time  . atorvastatin (LIPITOR) 20 MG tablet Take 1  tablet by mouth once daily (Patient taking differently: Take 20 mg by mouth daily. ) 90 tablet 2 03/20/2020  . calcium carbonate (TUMS - DOSED IN MG ELEMENTAL CALCIUM) 500 MG chewable tablet Chew 2 tablets by mouth daily as needed for indigestion or heartburn.   unk  . CINNAMON PO Take 1,000 mg by mouth in the morning, at noon, and at bedtime.    03/21/2020 at Unknown time  . clopidogrel (PLAVIX) 75 MG tablet Take 1 tablet by mouth once daily (Patient taking differently: Take 75 mg by mouth  daily. ) 90 tablet 2 03/21/2020 at Unknown time  . diltiazem (CARTIA XT) 240 MG 24 hr capsule Take 1 capsule (240 mg total) by mouth daily. 90 capsule 3 03/21/2020 at Unknown time  . glipiZIDE (GLUCOTROL XL) 5 MG 24 hr tablet Take 1 tablet by mouth once daily with breakfast (Patient taking differently: Take 5 mg by mouth daily with breakfast. ) 90 tablet 0 03/21/2020 at Unknown time  . hydrochlorothiazide (HYDRODIURIL) 12.5 MG tablet Take 1 tablet (12.5 mg total) by mouth daily. 90 tablet 2 03/21/2020 at Unknown time  . INVOKANA 100 MG TABS tablet TAKE 1 TABLET BY MOUTH BEFORE BREAKFAST (Patient taking differently: Take 100 mg by mouth daily before breakfast. ) 30 tablet 1 03/21/2020 at Unknown time  . metFORMIN (GLUCOPHAGE) 1000 MG tablet Take 1 tablet (1,000 mg total) by mouth 2 (two) times daily. 180 tablet 1 03/21/2020 at Unknown time  . metoprolol (LOPRESSOR) 100 MG tablet Take half tab in the morning and a whole tab in the evening. (Patient taking differently: Take 100 mg by mouth 2 (two) times daily. Take 1 tablet in the morning and 1/2 tablet in the evening.) 45 tablet 3 03/21/2020 at 0830  . OZEMPIC, 0.25 OR 0.5 MG/DOSE, 2 MG/1.5ML SOPN INJECT 0.5 MG INTO THE SKIN ONCE A WEEK (Patient taking differently: Inject 0.5 mg into the skin every Monday. ) 2 mL 1 03/14/2020 at Unknown time  . pantoprazole (PROTONIX) 40 MG tablet Take 1 tablet (40 mg total) by mouth daily. (Patient taking differently: Take 40 mg by mouth every  Monday, Wednesday, and Friday at 8 PM. ) 90 tablet 3 Past Week at Unknown time  . Polyethyl Glycol-Propyl Glycol (SYSTANE OP) Place 1 drop into both eyes 2 (two) times daily as needed (Dry eyes).   unk  . Probiotic Product ([employer] SENIOR PROBIOTIC PO) Take 1 capsule by mouth daily with breakfast.   03/21/2020 at Unknown time  . telmisartan (MICARDIS) 80 MG tablet Take 1 tablet (80 mg total) by mouth daily. (Patient taking differently: Take 40 mg by mouth daily. ) 90 tablet 3 03/21/2020 at Unknown time  . vitamin B-12 (CYANOCOBALAMIN) 1000 MCG tablet Take 1,000 mcg by mouth daily with supper.   03/20/2020    Family History  Problem Relation Age of Onset  . Cancer - Prostate Father   . Cancer - Colon Father   . Stroke Mother   . Hypertension Mother   . Hyperlipidemia Mother   . Melanoma Brother      Review of Systems:   ROS Pertinent items are noted in HPI.     Cardiac Review of Systems: Y or  [    ]= no  Chest Pain [    ]  Resting SOB [   ] Exertional SOB  [  ]  Orthopnea [  ]   Pedal Edema [   ]    Palpitations [  ] Syncope  [  ]   Presyncope [   ]  General Review of Systems: [Y] = yes [  ]=no Constitional: recent weight change [  ]; anorexia [  ]; fatigue [  ]; nausea [  ]; night sweats [  ]; fever [  ]; or chills [  ]  Dental: Last Dentist visit:   Eye : blurred vision [  ]; diplopia [   ]; vision changes [  ];  Amaurosis fugax[  ]; Resp: cough [  ];  wheezing[  ];  hemoptysis[  ]; shortness of breath[  ]; paroxysmal nocturnal dyspnea[  ]; dyspnea on exertion[  ]; or orthopnea[  ];  GI:  gallstones[  ], vomiting[  ];  dysphagia[  ]; melena[  ];  hematochezia [  ]; heartburn[  ];   Hx of  Colonoscopy[  ]; GU: kidney stones [  ]; hematuria[  ];   dysuria [  ];  nocturia[  ];  history of     obstruction [  ]; urinary frequency [  ]             Skin: rash, swelling[  ];, hair loss[  ];  peripheral edema[  ];  or itching[   ]; Musculosketetal: myalgias[  ];  joint swelling[  ];  joint erythema[  ];  joint pain[  ];  back pain[  ];  Heme/Lymph: bruising[  ];  bleeding[  ];  anemia[  ];  Neuro: TIA[  ];  headaches[  ];  stroke[  ];  vertigo[  ];  seizures[  ];   paresthesias[  ];  difficulty walking[  ];  Psych:depression[  ]; anxiety[  ];  Endocrine: diabetes[  ];  thyroid dysfunction[  ];             Physical Exam: BP (!) 154/55 (BP Location: Right Arm)   Pulse 81   Temp 98.4 F (36.9 C) (Oral)   Resp 18   Ht 5' 4.5" (1.638 m)   Wt 64.9 kg   SpO2 99%   BMI 24.18 kg/m    General appearance: alert and cooperative Neck: no adenopathy, no carotid bruit, no JVD, supple, symmetrical, trachea midline and thyroid not enlarged, symmetric, no tenderness/mass/nodules Resp: clear to auscultation bilaterally Cardio: regular rate and rhythm, S1, S2 normal, no murmur, click, rub or gallop GI: soft, non-tender; bowel sounds normal; no masses,  no organomegaly Extremities: s/p R trans-met amputation Neurologic: Alert and oriented X 3, normal strength and tone. Normal symmetric reflexes. Normal coordination and gait  Diagnostic Studies & Laboratory data:     Recent Radiology Findings:   CARDIAC CATHETERIZATION  Result Date: 03/22/2020  Ost LM to Mid LM lesion is 45% stenosed.  Prox LAD to Mid LAD lesion is 90% stenosed.  Mid LAD lesion is 75% stenosed.  Dist LAD lesion is 70% stenosed.  Ost Cx to Prox Cx lesion is 95% stenosed.  Prox Cx to Mid Cx lesion is 99% stenosed.  2nd Mrg lesion is 85% stenosed.  1st Mrg lesion is 95% stenosed.  Ost RCA lesion is 50% stenosed.  Mid RCA to Dist RCA lesion is 90% stenosed.  RPDA lesion is 70% stenosed.  The left ventricular systolic function is normal.  LV end diastolic pressure is mildly elevated.  The left ventricular ejection fraction is 55-65% by visual estimate.  1. Severe 3 vessel obstructive CAD. 2. Normal LV function 3. Elevated LVEDP 23 mm Hg Plan: complex  multivessel disease in a diabetic. Recommend consideration for CABG. Will hold Plavix. Resume IV heparin post cath.   ECHOCARDIOGRAM COMPLETE  Result Date: 03/23/2020    ECHOCARDIOGRAM REPORT   Patient Name:   [name] Date of Exam: 03/23/2020 Medical Rec #:  [id number]     Height:       64.5  in Accession #:    [id number]    Weight:       143.1 lb Date of Birth:  11/11/1940     BSA:          1.706 m Patient Age:    52 years      BP:           119/45 mmHg Patient Gender: F             HR:           65 bpm. Exam Location:  Inpatient Procedure: 2D Echo, Cardiac Doppler and Color Doppler Indications:    NSTEMI I21.4  History:        Patient has no prior history of Echocardiogram examinations.                 Signs/Symptoms:Murmur; Risk Factors:Hypertension, Diabetes and                 Dyslipidemia. Cancer. GERD.  Sonographer:    [name] Dance Referring Phys: [place]  1. Left ventricular ejection fraction, by estimation, is 60 to 65%. The left ventricle has normal function. The left ventricle has no regional wall motion abnormalities. Left ventricular diastolic parameters are consistent with Grade I diastolic dysfunction (impaired relaxation).  2. Right ventricular systolic function is normal. The right ventricular size is normal. There is normal pulmonary artery systolic pressure.  3. Left atrial size was mildly dilated.  4. The mitral valve is normal in structure. Trivial mitral valve regurgitation. No evidence of mitral stenosis.  5. The aortic valve is normal in structure. Aortic valve regurgitation is not visualized. No aortic stenosis is present.  6. The inferior vena cava is normal in size with greater than 50% respiratory variability, suggesting right atrial pressure of 3 mmHg. FINDINGS  Left Ventricle: Left ventricular ejection fraction, by estimation, is 60 to 65%. The left ventricle has normal function. The left ventricle has no regional wall motion abnormalities. The left  ventricular internal cavity size was normal in size. There is  no left ventricular hypertrophy. Left ventricular diastolic parameters are consistent with Grade I diastolic dysfunction (impaired relaxation). Indeterminate filling pressures. Right Ventricle: The right ventricular size is normal. No increase in right ventricular wall thickness. Right ventricular systolic function is normal. There is normal pulmonary artery systolic pressure. The tricuspid regurgitant velocity is 2.10 m/s, and  with an assumed right atrial pressure of 3 mmHg, the estimated right ventricular systolic pressure is 27.0 mmHg. Left Atrium: Left atrial size was mildly dilated. Right Atrium: Right atrial size was normal in size. Pericardium: There is no evidence of pericardial effusion. Mitral Valve: The mitral valve is normal in structure. There is mild thickening of the anterior mitral valve leaflet(s). Normal mobility of the mitral valve leaflets. Trivial mitral valve regurgitation. No evidence of mitral valve stenosis. Tricuspid Valve: The tricuspid valve is normal in structure. Tricuspid valve regurgitation is trivial. No evidence of tricuspid stenosis. Aortic Valve: The aortic valve is normal in structure.. There is mild thickening and mild calcification of the aortic valve. Aortic valve regurgitation is not visualized. No aortic stenosis is present. There is mild thickening of the aortic valve. There is mild calcification of the aortic valve. Pulmonic Valve: The pulmonic valve was normal in structure. Pulmonic valve regurgitation is trivial. No evidence of pulmonic stenosis. Aorta: The aortic root is normal in size and structure. Venous: The inferior vena cava is normal in size with greater than 50% respiratory  variability, suggesting right atrial pressure of 3 mmHg. IAS/Shunts: No atrial level shunt detected by color flow Doppler.  LEFT VENTRICLE PLAX 2D LVIDd:         4.12 cm  Diastology LVIDs:         2.98 cm  LV e' lateral:   6.42  cm/s LV PW:         0.95 cm  LV E/e' lateral: 12.1 LV IVS:        1.02 cm  LV e' medial:    5.87 cm/s LVOT diam:     2.10 cm  LV E/e' medial:  13.2 LV SV:         77 LV SV Index:   45 LVOT Area:     3.46 cm  RIGHT VENTRICLE             IVC RV Basal diam:  2.67 cm     IVC diam: 1.73 cm RV S prime:     11.40 cm/s TAPSE (M-mode): 1.7 cm LEFT ATRIUM             Index       RIGHT ATRIUM           Index LA diam:        3.40 cm 1.99 cm/m  RA Area:     11.50 cm LA Vol (A2C):   51.7 ml 30.30 ml/m RA Volume:   24.10 ml  14.13 ml/m LA Vol (A4C):   31.0 ml 18.17 ml/m LA Biplane Vol: 42.7 ml 25.03 ml/m  AORTIC VALVE LVOT Vmax:   81.30 cm/s LVOT Vmean:  52.100 cm/s LVOT VTI:    0.221 m  AORTA Ao Root diam: 2.90 cm Ao Asc diam:  3.50 cm MITRAL VALVE               TRICUSPID VALVE MV Area (PHT): 2.09 cm    TR Peak grad:   17.6 mmHg MV Decel Time: 363 msec    TR Vmax:        210.00 cm/s MV E velocity: 77.60 cm/s MV A velocity: 95.10 cm/s  SHUNTS MV E/A ratio:  0.82        Systemic VTI:  0.22 m                            Systemic Diam: 2.10 cm Skeet Latch MD Electronically signed by Skeet Latch MD Signature Date/Time: 03/23/2020/1:09:27 PM    Final    VAS US DOPPLER PRE CABG  Result Date: 03/23/2020 PREOPERATIVE VASCULAR EVALUATION  Indications:      Pre-CABG. Other Factors:    11/25/19-Orbital atherectomy and balloon angioplasty of the left                   anterior tibial                   artery; Orbital atherectomy, drug-coated balloon angioplasty                   and intravascular                   lithotripsy of the left popliteal artery; Orbital atherectomy,                   intravascular                   lithotripsy, drug coated balloon angioplasty of the left SFA  with stenting of the                   proximal segment due to flow-limiting dissection Mynx closure                   device.                   08/03/19- Atherectomy of the right SFA, popliteal and                    tibio-peroneal trunk arteries.                   07/21/19- Right common femoral endarterectomy.                   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                   12/04/2019 ABI- Right=0.82 Left=0.76 Performing Technologist: Maudry Mayhew MHA, RVT, RDCS, RDMS  Examination Guidelines: A complete evaluation includes B-mode imaging, spectral Doppler, color Doppler, and power Doppler as needed of all accessible portions of each vessel. Bilateral testing is considered an integral part of a complete examination. Limited examinations for reoccurring indications may be performed as noted.  Right Carotid Findings: +----------+--------+--------+--------+-------------------------+--------+           PSV cm/sEDV cm/sStenosisDescribe                 Comments +----------+--------+--------+--------+-------------------------+--------+ CCA Prox  73      8                                                 +----------+--------+--------+--------+-------------------------+--------+ CCA Distal79      8               calcific                          +----------+--------+--------+--------+-------------------------+--------+ ICA Prox  343     52      40-59%  heterogenous and calcific         +----------+--------+--------+--------+-------------------------+--------+ ICA Distal91      17                                                +----------+--------+--------+--------+-------------------------+--------+ ECA       373             >50%    heterogenous and calcific         +----------+--------+--------+--------+-------------------------+--------+ Portions of this table do not appear on this page. +----------+--------+-------+----------------+------------+           PSV cm/sEDV cmsDescribe        Arm Pressure  +----------+--------+-------+----------------+------------+ Subclavian122            Multiphasic, WNL             +----------+--------+-------+----------------+------------+ +---------+--------+--+--------+-+---------+ VertebralPSV cm/s78EDV cm/s9Antegrade +---------+--------+--+--------+-+---------+ Left Carotid Findings: +----------+--------+-------+--------+--------------------------------+--------+           PSV cm/sEDV    StenosisDescribe  Comments                   cm/s                                                    +----------+--------+-------+--------+--------------------------------+--------+ CCA Prox  145     14                                                      +----------+--------+-------+--------+--------------------------------+--------+ CCA Distal106     14                                                      +----------+--------+-------+--------+--------------------------------+--------+ ICA Prox  123     16             heterogenous, smooth and                                                  calcific                                 +----------+--------+-------+--------+--------------------------------+--------+ ICA Distal112     20                                                      +----------+--------+-------+--------+--------------------------------+--------+ ECA       398            >50%    heterogenous and calcific                +----------+--------+-------+--------+--------------------------------+--------+ +----------+--------+--------+----------------+------------+ SubclavianPSV cm/sEDV cm/sDescribe        Arm Pressure +----------+--------+--------+----------------+------------+           165             Multiphasic, WNL             +----------+--------+--------+----------------+------------+ +---------+--------+--+--------+--+---------+ VertebralPSV cm/s52EDV cm/s10Antegrade  +---------+--------+--+--------+--+---------+  ABI Findings: +--------+------------------+-----+----------+--------+ Right   Rt Pressure (mmHg)IndexWaveform  Comment  +--------+------------------+-----+----------+--------+ [id number]                    triphasic          +--------+------------------+-----+----------+--------+ PTA     73                0.47 monophasic         +--------+------------------+-----+----------+--------+ DP      51                0.33 monophasic         +--------+------------------+-----+----------+--------+ +--------+------------------+-----+---------+-------+ Left    Lt Pressure (mmHg)IndexWaveform Comment +--------+------------------+-----+---------+-------+ [id number]  triphasic        +--------+------------------+-----+---------+-------+ PTA     124               0.81 biphasic         +--------+------------------+-----+---------+-------+ DP      154               1.00 triphasic        +--------+------------------+-----+---------+-------+ +-------+---------------+----------------+ ABI/TBIToday's ABI/TBIPrevious ABI/TBI +-------+---------------+----------------+ Right  0.47           0.82             +-------+---------------+----------------+ Left   1.0            0.76             +-------+---------------+----------------+  Right Doppler Findings: +-----------+--------+-----+---------+-----------------------------------------+ Site       PressureIndexDoppler  Comments                                  +-----------+--------+-----+---------+-----------------------------------------+ Brachial   135          triphasic                                          +-----------+--------+-----+---------+-----------------------------------------+ Radial                  triphasic                                          +-----------+--------+-----+---------+-----------------------------------------+  Ulnar                   triphasic                                          +-----------+--------+-----+---------+-----------------------------------------+ Palmar Arch                      Signal obliterates with radial                                             compression, is unaffected with ulnar                                      compression                               +-----------+--------+-----+---------+-----------------------------------------+  Left Doppler Findings: +-----------+--------+-----+---------+-----------------------------------------+ Site       PressureIndexDoppler  Comments                                  +-----------+--------+-----+---------+-----------------------------------------+ Brachial   154          triphasic                                          +-----------+--------+-----+---------+-----------------------------------------+  Radial                  triphasic                                          +-----------+--------+-----+---------+-----------------------------------------+ Ulnar                   triphasic                                          +-----------+--------+-----+---------+-----------------------------------------+ Palmar Arch                      Signal is unaffected with radial                                           compression, decreases >50% with ulnar                                     compression.                              +-----------+--------+-----+---------+-----------------------------------------+  Summary: Right Carotid: Velocities in the right ICA are consistent with a 40-59%                stenosis. Peak systolic and end diastolic right ICA velocities                have increased when compared to prior study The ECA appears >50%                stenosed. Left Carotid: Velocities in the left ICA are consistent with a 1-39% stenosis.               The ECA appears >50%  stenosed. Vertebrals:  Bilateral vertebral arteries demonstrate antegrade flow. Subclavians: Normal flow hemodynamics were seen in bilateral subclavian              arteries. 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 Right ABI appears significantly decreased compared to prior study on 12/04/2019. Left ABI: Resting left ankle-brachial index is within normal range. No evidence of significant left lower extremity arterial disease.Left ABIs appear increased compared to prior study on 12/04/2019.  Right Upper Extremity: Doppler waveform obliterate with right radial compression. Doppler waveforms remain within normal limits with right ulnar compression.  Left Upper Extremity: Doppler waveforms remain within normal limits with left radial compression. Doppler waveforms decrease >50% with left ulnar compression.     Preliminary      I have independently reviewed the above radiologic studies and discussed with the patient   Recent Lab Findings: Lab Results  Component Value Date   WBC 7.4 03/23/2020   HGB 11.5 (L) 03/23/2020   HCT 36.1 03/23/2020   PLT 205 03/23/2020   GLUCOSE 103 (H) 03/22/2020   CHOL 154 03/22/2020   TRIG 114 03/22/2020   HDL 44 03/22/2020   LDLCALC 87 03/22/2020   ALT 15 07/21/2019   AST 19 07/21/2019   NA 141 03/22/2020  K 3.7 03/22/2020   CL 107 03/22/2020   CREATININE 0.76 03/22/2020   BUN 19 03/22/2020   CO2 25 03/22/2020   TSH 1.610 06/09/2019   INR 1.1 07/21/2019   HGBA1C 6.0 01/26/2020      Assessment / Plan:      79 yo lady with complex, multivessel CAD s/p NSTEMI. Agree with suggestion for CABG. Will plan for operation on 03/24/20.    I  spent 40 minutes counseling the patient face to face.  Broadus Z. Orvan Seen, [place] 03/23/2020 6:58 PM

## 2020-03-23 NOTE — Progress Notes (Signed)
ANTICOAGULATION CONSULT NOTE - Follow Up Consult  Pharmacy Consult for heparin Indication: CAD awaiting CABG consult  Labs: Recent Labs    03/21/20 1821 03/21/20 2128 03/22/20 0328 03/22/20 0801 03/23/20 0411  HGB 13.3  --   --   --  11.5*  HCT 40.8  --   --   --  36.1  PLT 260  --   --   --  205  HEPARINUNFRC  --   --   --  0.38 0.21*  CREATININE 0.93  --  0.76  --   --   TROPONINIHS 24* 553* 1,441*  --   --     Assessment: 79yo female subtherapeutic on heparin after resumed post-cath; no gtt issues or signs of bleeding per RN other than some bleeding at IV site but RN moved IV and no problems since.  Goal of Therapy:  Heparin level 0.3-0.7 units/ml   Plan:  Will increase heparin gtt by 1-2 units/kg/hr to 900 units/hr and check level in 8 hours.    Wynona Neat, PharmD, BCPS  03/23/2020,5:08 AM

## 2020-03-23 NOTE — Progress Notes (Signed)
CARDIAC REHAB PHASE I   Preop education completed with pt. Pt given IS and able to demonstrate 1625. Pt educated on importance of sternal precautions, IS use, and walks post-op. Pt given in-the-tube sheet along with Cardiac surgery booklet and [facility] care guide. Pt states her children are coming in to town to stay with her after d/c. Pt denies further questions or concerns. Will continue to follow.  [id number] Rufina Falco, RN BSN 03/23/2020 12:16 PM

## 2020-03-23 NOTE — Progress Notes (Signed)
  Echocardiogram 2D Echocardiogram has been performed.  [name] [name] 03/23/2020, 11:03 AM

## 2020-03-24 ENCOUNTER — Inpatient Hospital Stay (HOSPITAL_COMMUNITY): Payer: Medicare Other | Admitting: Certified Registered"

## 2020-03-24 ENCOUNTER — Inpatient Hospital Stay (HOSPITAL_COMMUNITY)
Admission: EM | Disposition: A | Discharge status: Home / Self Care | Payer: Self-pay | Source: Home / Self Care | Attending: Cardiothoracic Surgery

## 2020-03-24 ENCOUNTER — Inpatient Hospital Stay (HOSPITAL_COMMUNITY): Payer: Medicare Other

## 2020-03-24 DIAGNOSIS — Z951 Presence of aortocoronary bypass graft: Secondary | ICD-10-CM

## 2020-03-24 HISTORY — PX: CORONARY ARTERY BYPASS GRAFT: SHX141

## 2020-03-24 HISTORY — PX: TEE WITHOUT CARDIOVERSION: SHX5443

## 2020-03-24 HISTORY — PX: ENDOVEIN HARVEST OF GREATER SAPHENOUS VEIN: SHX5059

## 2020-03-24 LAB — BASIC METABOLIC PANEL
Anion gap: 10 (ref 5–15)
BUN: 13 mg/dL (ref 8–23)
CO2: 21 mmol/L — ABNORMAL LOW (ref 22–32)
Calcium: 7.2 mg/dL — ABNORMAL LOW (ref 8.9–10.3)
Chloride: 111 mmol/L (ref 98–111)
Creatinine, Ser: 0.74 mg/dL (ref 0.44–1.00)
GFR calc Af Amer: >60 mL/min (ref >60)
GFR calc non Af Amer: >60 mL/min (ref >60)
Glucose, Bld: 172 mg/dL — ABNORMAL HIGH (ref 70–99)
Potassium: 3.7 mmol/L (ref 3.5–5.1)
Sodium: 142 mmol/L (ref 135–145)

## 2020-03-24 LAB — COMPREHENSIVE METABOLIC PANEL
ALT: 14 U/L (ref 0–44)
AST: 18 U/L (ref 15–41)
Albumin: 3.5 g/dL (ref 3.5–5.0)
Alkaline Phosphatase: 45 U/L (ref 38–126)
Anion gap: 10 (ref 5–15)
BUN: 20 mg/dL (ref 8–23)
CO2: 22 mmol/L (ref 22–32)
Calcium: 9.2 mg/dL (ref 8.9–10.3)
Chloride: 107 mmol/L (ref 98–111)
Creatinine, Ser: 0.88 mg/dL (ref 0.44–1.00)
GFR calc Af Amer: >60 mL/min (ref >60)
GFR calc non Af Amer: >60 mL/min (ref >60)
Glucose, Bld: 125 mg/dL — ABNORMAL HIGH (ref 70–99)
Potassium: 3.7 mmol/L (ref 3.5–5.1)
Sodium: 139 mmol/L (ref 135–145)
Total Bilirubin: 0.6 mg/dL (ref 0.3–1.2)
Total Protein: 5.7 g/dL — ABNORMAL LOW (ref 6.5–8.1)

## 2020-03-24 LAB — POCT I-STAT 7, (LYTES, BLD GAS, ICA,H+H)
Acid-Base Excess: 2 mmol/L (ref 0.0–2.0)
Acid-Base Excess: 1 mmol/L (ref 0.0–2.0)
Acid-base deficit: 1 mmol/L (ref 0.0–2.0)
Acid-base deficit: 3 mmol/L — ABNORMAL HIGH (ref 0.0–2.0)
Acid-base deficit: 4 mmol/L — ABNORMAL HIGH (ref 0.0–2.0)
Bicarbonate: 23.7 mmol/L (ref 20.0–28.0)
Bicarbonate: 25.4 mmol/L (ref 20.0–28.0)
Bicarbonate: 25 mmol/L (ref 20.0–28.0)
Bicarbonate: 23.6 mmol/L (ref 20.0–28.0)
Bicarbonate: 22.8 mmol/L (ref 20.0–28.0)
Calcium, Ion: 0.95 mmol/L — ABNORMAL LOW (ref 1.15–1.40)
Calcium, Ion: 1.09 mmol/L — ABNORMAL LOW (ref 1.15–1.40)
Calcium, Ion: 1.11 mmol/L — ABNORMAL LOW (ref 1.15–1.40)
Calcium, Ion: 1.13 mmol/L — ABNORMAL LOW (ref 1.15–1.40)
Calcium, Ion: 1.04 mmol/L — ABNORMAL LOW (ref 1.15–1.40)
HCT: 20 % — ABNORMAL LOW (ref 36.0–46.0)
HCT: 26 % — ABNORMAL LOW (ref 36.0–46.0)
HCT: 26 % — ABNORMAL LOW (ref 36.0–46.0)
HCT: 21 % — ABNORMAL LOW (ref 36.0–46.0)
HCT: 19 % — ABNORMAL LOW (ref 36.0–46.0)
Hemoglobin: 6.8 g/dL — CL (ref 12.0–15.0)
Hemoglobin: 8.8 g/dL — ABNORMAL LOW (ref 12.0–15.0)
Hemoglobin: 8.8 g/dL — ABNORMAL LOW (ref 12.0–15.0)
Hemoglobin: 7.1 g/dL — ABNORMAL LOW (ref 12.0–15.0)
Hemoglobin: 6.5 g/dL — CL (ref 12.0–15.0)
O2 Saturation: 100 %
O2 Saturation: 100 %
O2 Saturation: 100 %
O2 Saturation: 98 %
O2 Saturation: 100 %
Patient temperature: 35.9
Patient temperature: 36
Potassium: 3.6 mmol/L (ref 3.5–5.1)
Potassium: 4.1 mmol/L (ref 3.5–5.1)
Potassium: 4.2 mmol/L (ref 3.5–5.1)
Potassium: 3.8 mmol/L (ref 3.5–5.1)
Potassium: 3.6 mmol/L (ref 3.5–5.1)
Sodium: 139 mmol/L (ref 135–145)
Sodium: 142 mmol/L (ref 135–145)
Sodium: 143 mmol/L (ref 135–145)
Sodium: 145 mmol/L (ref 135–145)
Sodium: 145 mmol/L (ref 135–145)
TCO2: 25 mmol/L (ref 22–32)
TCO2: 26 mmol/L (ref 22–32)
TCO2: 26 mmol/L (ref 22–32)
TCO2: 25 mmol/L (ref 22–32)
TCO2: 24 mmol/L (ref 22–32)
pCO2 arterial: 35.9 mmHg (ref 32.0–48.0)
pCO2 arterial: 35.2 mmHg (ref 32.0–48.0)
pCO2 arterial: 37.3 mmHg (ref 32.0–48.0)
pCO2 arterial: 45 mmHg (ref 32.0–48.0)
pCO2 arterial: 46.9 mmHg (ref 32.0–48.0)
pH, Arterial: 7.428 (ref 7.350–7.450)
pH, Arterial: 7.466 — ABNORMAL HIGH (ref 7.350–7.450)
pH, Arterial: 7.434 (ref 7.350–7.450)
pH, Arterial: 7.323 — ABNORMAL LOW (ref 7.350–7.450)
pH, Arterial: 7.29 — ABNORMAL LOW (ref 7.350–7.450)
pO2, Arterial: 423 mmHg — ABNORMAL HIGH (ref 83.0–108.0)
pO2, Arterial: 342 mmHg — ABNORMAL HIGH (ref 83.0–108.0)
pO2, Arterial: 323 mmHg — ABNORMAL HIGH (ref 83.0–108.0)
pO2, Arterial: 106 mmHg (ref 83.0–108.0)
pO2, Arterial: 200 mmHg — ABNORMAL HIGH (ref 83.0–108.0)

## 2020-03-24 LAB — POCT I-STAT, CHEM 8
BUN: 17 mg/dL (ref 8–23)
BUN: 14 mg/dL (ref 8–23)
BUN: 17 mg/dL (ref 8–23)
BUN: 13 mg/dL (ref 8–23)
BUN: 14 mg/dL (ref 8–23)
Calcium, Ion: 1.35 mmol/L (ref 1.15–1.40)
Calcium, Ion: 1.02 mmol/L — ABNORMAL LOW (ref 1.15–1.40)
Calcium, Ion: 1.31 mmol/L (ref 1.15–1.40)
Calcium, Ion: 1.07 mmol/L — ABNORMAL LOW (ref 1.15–1.40)
Calcium, Ion: 1.09 mmol/L — ABNORMAL LOW (ref 1.15–1.40)
Chloride: 105 mmol/L (ref 98–111)
Chloride: 104 mmol/L (ref 98–111)
Chloride: 105 mmol/L (ref 98–111)
Chloride: 106 mmol/L (ref 98–111)
Chloride: 106 mmol/L (ref 98–111)
Creatinine, Ser: 0.6 mg/dL (ref 0.44–1.00)
Creatinine, Ser: 0.5 mg/dL (ref 0.44–1.00)
Creatinine, Ser: 0.6 mg/dL (ref 0.44–1.00)
Creatinine, Ser: 0.5 mg/dL (ref 0.44–1.00)
Creatinine, Ser: 0.4 mg/dL — ABNORMAL LOW (ref 0.44–1.00)
Glucose, Bld: 158 mg/dL — ABNORMAL HIGH (ref 70–99)
Glucose, Bld: 121 mg/dL — ABNORMAL HIGH (ref 70–99)
Glucose, Bld: 135 mg/dL — ABNORMAL HIGH (ref 70–99)
Glucose, Bld: 134 mg/dL — ABNORMAL HIGH (ref 70–99)
Glucose, Bld: 120 mg/dL — ABNORMAL HIGH (ref 70–99)
HCT: 33 % — ABNORMAL LOW (ref 36.0–46.0)
HCT: 25 % — ABNORMAL LOW (ref 36.0–46.0)
HCT: 31 % — ABNORMAL LOW (ref 36.0–46.0)
HCT: 23 % — ABNORMAL LOW (ref 36.0–46.0)
HCT: 22 % — ABNORMAL LOW (ref 36.0–46.0)
Hemoglobin: 11.2 g/dL — ABNORMAL LOW (ref 12.0–15.0)
Hemoglobin: 10.5 g/dL — ABNORMAL LOW (ref 12.0–15.0)
Hemoglobin: 8.5 g/dL — ABNORMAL LOW (ref 12.0–15.0)
Hemoglobin: 7.8 g/dL — ABNORMAL LOW (ref 12.0–15.0)
Hemoglobin: 7.5 g/dL — ABNORMAL LOW (ref 12.0–15.0)
Potassium: 3.8 mmol/L (ref 3.5–5.1)
Potassium: 3.4 mmol/L — ABNORMAL LOW (ref 3.5–5.1)
Potassium: 4.9 mmol/L (ref 3.5–5.1)
Potassium: 4.6 mmol/L (ref 3.5–5.1)
Potassium: 3.8 mmol/L (ref 3.5–5.1)
Sodium: 143 mmol/L (ref 135–145)
Sodium: 140 mmol/L (ref 135–145)
Sodium: 143 mmol/L (ref 135–145)
Sodium: 140 mmol/L (ref 135–145)
Sodium: 143 mmol/L (ref 135–145)
TCO2: 26 mmol/L (ref 22–32)
TCO2: 24 mmol/L (ref 22–32)
TCO2: 24 mmol/L (ref 22–32)
TCO2: 24 mmol/L (ref 22–32)
TCO2: 26 mmol/L (ref 22–32)

## 2020-03-24 LAB — CBC
HCT: 36.2 % (ref 36.0–46.0)
HCT: 25 % — ABNORMAL LOW (ref 36.0–46.0)
HCT: 29.7 % — ABNORMAL LOW (ref 36.0–46.0)
HCT: 29.7 % — ABNORMAL LOW (ref 36.0–46.0)
Hemoglobin: 11.5 g/dL — ABNORMAL LOW (ref 12.0–15.0)
Hemoglobin: 8.1 g/dL — ABNORMAL LOW (ref 12.0–15.0)
Hemoglobin: 9.8 g/dL — ABNORMAL LOW (ref 12.0–15.0)
Hemoglobin: 9.9 g/dL — ABNORMAL LOW (ref 12.0–15.0)
MCH: 27.2 pg (ref 26.0–34.0)
MCH: 27.9 pg (ref 26.0–34.0)
MCH: 28.7 pg (ref 26.0–34.0)
MCH: 28.5 pg (ref 26.0–34.0)
MCHC: 31.8 g/dL (ref 30.0–36.0)
MCHC: 32.4 g/dL (ref 30.0–36.0)
MCHC: 33 g/dL (ref 30.0–36.0)
MCHC: 33.3 g/dL (ref 30.0–36.0)
MCV: 85.6 fL (ref 80.0–100.0)
MCV: 86.2 fL (ref 80.0–100.0)
MCV: 86.8 fL (ref 80.0–100.0)
MCV: 85.6 fL (ref 80.0–100.0)
Platelets: 203 10*3/uL (ref 150–400)
Platelets: 110 10*3/uL — ABNORMAL LOW (ref 150–400)
Platelets: 142 10*3/uL — ABNORMAL LOW (ref 150–400)
Platelets: 143 10*3/uL — ABNORMAL LOW (ref 150–400)
RBC: 4.23 MIL/uL (ref 3.87–5.11)
RBC: 2.9 MIL/uL — ABNORMAL LOW (ref 3.87–5.11)
RBC: 3.42 MIL/uL — ABNORMAL LOW (ref 3.87–5.11)
RBC: 3.47 MIL/uL — ABNORMAL LOW (ref 3.87–5.11)
RDW: 13 % (ref 11.5–15.5)
RDW: 13.5 % (ref 11.5–15.5)
RDW: 17.6 % — ABNORMAL HIGH (ref 11.5–15.5)
RDW: 17 % — ABNORMAL HIGH (ref 11.5–15.5)
WBC: 6.9 10*3/uL (ref 4.0–10.5)
WBC: 11.2 10*3/uL — ABNORMAL HIGH (ref 4.0–10.5)
WBC: 9 10*3/uL (ref 4.0–10.5)
WBC: 9 10*3/uL (ref 4.0–10.5)
nRBC: 0 % (ref 0.0–0.2)
nRBC: 0 % (ref 0.0–0.2)
nRBC: 0 % (ref 0.0–0.2)
nRBC: 0 % (ref 0.0–0.2)

## 2020-03-24 LAB — GLUCOSE, CAPILLARY
Glucose-Capillary: 109 mg/dL — ABNORMAL HIGH (ref 70–99)
Glucose-Capillary: 193 mg/dL — ABNORMAL HIGH (ref 70–99)
Glucose-Capillary: 166 mg/dL — ABNORMAL HIGH (ref 70–99)
Glucose-Capillary: 143 mg/dL — ABNORMAL HIGH (ref 70–99)
Glucose-Capillary: 151 mg/dL — ABNORMAL HIGH (ref 70–99)
Glucose-Capillary: 145 mg/dL — ABNORMAL HIGH (ref 70–99)
Glucose-Capillary: 163 mg/dL — ABNORMAL HIGH (ref 70–99)
Glucose-Capillary: 170 mg/dL — ABNORMAL HIGH (ref 70–99)
Glucose-Capillary: 137 mg/dL — ABNORMAL HIGH (ref 70–99)
Glucose-Capillary: 135 mg/dL — ABNORMAL HIGH (ref 70–99)

## 2020-03-24 LAB — ECHO INTRAOPERATIVE TEE
Height: 64.5 in
Weight: 2270.4 oz

## 2020-03-24 LAB — COOXEMETRY PANEL
Carboxyhemoglobin: 1.4 % (ref 0.5–1.5)
Methemoglobin: 1.1 % (ref 0.0–1.5)
O2 Saturation: 58.8 %
Total hemoglobin: 8.2 g/dL — ABNORMAL LOW (ref 12.0–16.0)

## 2020-03-24 LAB — PROTIME-INR
INR: 1.1 (ref 0.8–1.2)
INR: 1.8 — ABNORMAL HIGH (ref 0.8–1.2)
Prothrombin Time: 13.4 seconds (ref 11.4–15.2)
Prothrombin Time: 20.3 seconds — ABNORMAL HIGH (ref 11.4–15.2)

## 2020-03-24 LAB — BLOOD GAS, ARTERIAL
Acid-base deficit: 1.1 mmol/L (ref 0.0–2.0)
Bicarbonate: 22.2 mmol/L (ref 20.0–28.0)
FIO2: 21
O2 Saturation: 98.7 %
Patient temperature: 36.7
pCO2 arterial: 31.2 mmHg — ABNORMAL LOW (ref 32.0–48.0)
pH, Arterial: 7.465 — ABNORMAL HIGH (ref 7.350–7.450)
pO2, Arterial: 135 mmHg — ABNORMAL HIGH (ref 83.0–108.0)

## 2020-03-24 LAB — HEPARIN LEVEL (UNFRACTIONATED): Heparin Unfractionated: 0.52 IU/mL (ref 0.30–0.70)

## 2020-03-24 LAB — HEMOGLOBIN A1C
Hgb A1c MFr Bld: 6.9 % — ABNORMAL HIGH (ref 4.8–5.6)
Mean Plasma Glucose: 151.33 mg/dL

## 2020-03-24 LAB — PREPARE RBC (CROSSMATCH)

## 2020-03-24 LAB — MAGNESIUM: Magnesium: 3 mg/dL — ABNORMAL HIGH (ref 1.7–2.4)

## 2020-03-24 LAB — PLATELET COUNT: Platelets: 130 10*3/uL — ABNORMAL LOW (ref 150–400)

## 2020-03-24 LAB — APTT
aPTT: 83 seconds — ABNORMAL HIGH (ref 24–36)
aPTT: 40 seconds — ABNORMAL HIGH (ref 24–36)

## 2020-03-24 LAB — HEMOGLOBIN AND HEMATOCRIT, BLOOD
HCT: 26 % — ABNORMAL LOW (ref 36.0–46.0)
Hemoglobin: 8.5 g/dL — ABNORMAL LOW (ref 12.0–15.0)

## 2020-03-24 SURGERY — CORONARY ARTERY BYPASS GRAFTING (CABG)
Anesthesia: General | Site: Leg Upper | Laterality: Right

## 2020-03-24 MED ORDER — ROCURONIUM BROMIDE 100 MG/10ML IV SOLN
INTRAVENOUS | Status: DC | PRN
  Administered 2020-03-24: 30 mg via INTRAVENOUS
  Administered 2020-03-24: 50 mg via INTRAVENOUS
  Administered 2020-03-24: 30 mg via INTRAVENOUS
  Administered 2020-03-24: 70 mg via INTRAVENOUS
  Administered 2020-03-24: 50 mg via INTRAVENOUS

## 2020-03-24 MED ORDER — SODIUM CHLORIDE 0.9% FLUSH
10.0000 mL | Freq: Two times a day (BID) | INTRAVENOUS | Status: DC
  Administered 2020-03-24 – 2020-03-27 (×5): 10 mL
  Administered 2020-03-27: 20 mL
  Administered 2020-03-28 – 2020-03-29 (×2): 10 mL

## 2020-03-24 MED ORDER — PROTAMINE SULFATE 10 MG/ML IV SOLN
INTRAVENOUS | Status: AC
  Filled 2020-03-24: qty 25

## 2020-03-24 MED ORDER — ACETAMINOPHEN 650 MG RE SUPP
650.0000 mg | Freq: Once | RECTAL | Status: AC
  Administered 2020-03-24: 650 mg via RECTAL

## 2020-03-24 MED ORDER — LACTATED RINGERS IV SOLN: 500.0000 mL | Freq: Once | INTRAVENOUS | Status: DC | PRN

## 2020-03-24 MED ORDER — ROCURONIUM BROMIDE 10 MG/ML (PF) SYRINGE
PREFILLED_SYRINGE | INTRAVENOUS | Status: AC
  Filled 2020-03-24: qty 10

## 2020-03-24 MED ORDER — PHENYLEPHRINE 40 MCG/ML (10ML) SYRINGE FOR IV PUSH (FOR BLOOD PRESSURE SUPPORT)
PREFILLED_SYRINGE | INTRAVENOUS | Status: AC
  Filled 2020-03-24: qty 10

## 2020-03-24 MED ORDER — VANCOMYCIN HCL IN DEXTROSE 1-5 GM/200ML-% IV SOLN
1000.0000 mg | Freq: Once | INTRAVENOUS | Status: AC
  Administered 2020-03-24: 1000 mg via INTRAVENOUS
  Filled 2020-03-24: qty 200

## 2020-03-24 MED ORDER — PHENYLEPHRINE HCL-NACL 20-0.9 MG/250ML-% IV SOLN
0.0000 ug/min | INTRAVENOUS | Status: DC
  Administered 2020-03-24: 50 ug/min via INTRAVENOUS
  Administered 2020-03-25: 40 ug/min via INTRAVENOUS
  Filled 2020-03-24 (×2): qty 250

## 2020-03-24 MED ORDER — BISACODYL 5 MG PO TBEC
10.0000 mg | DELAYED_RELEASE_TABLET | Freq: Every day | ORAL | Status: DC
  Administered 2020-03-25 – 2020-03-28 (×4): 10 mg via ORAL
  Filled 2020-03-24 (×5): qty 2

## 2020-03-24 MED ORDER — FENTANYL CITRATE (PF) 250 MCG/5ML IJ SOLN
INTRAMUSCULAR | Status: AC
  Filled 2020-03-24: qty 25

## 2020-03-24 MED ORDER — PROTAMINE SULFATE 10 MG/ML IV SOLN
INTRAVENOUS | Status: DC | PRN
  Administered 2020-03-24: 30 mg via INTRAVENOUS
  Administered 2020-03-24: 20 mg via INTRAVENOUS
  Administered 2020-03-24: 100 mg via INTRAVENOUS
  Administered 2020-03-24: 20 mg via INTRAVENOUS
  Administered 2020-03-24: 50 mg via INTRAVENOUS

## 2020-03-24 MED ORDER — ALBUMIN HUMAN 5 % IV SOLN
INTRAVENOUS | Status: DC | PRN
Start: 2020-03-24 — End: 2020-03-24

## 2020-03-24 MED ORDER — SODIUM CHLORIDE 0.9% IV SOLUTION: Freq: Once | INTRAVENOUS | Status: AC

## 2020-03-24 MED ORDER — STERILE WATER FOR INJECTION IJ SOLN
INTRAMUSCULAR | Status: DC | PRN
  Administered 2020-03-24: 10 mL

## 2020-03-24 MED ORDER — PROPOFOL 10 MG/ML IV BOLUS
INTRAVENOUS | Status: DC | PRN
  Administered 2020-03-24: 40 mg via INTRAVENOUS

## 2020-03-24 MED ORDER — CALCIUM CHLORIDE 10 % IV SOLN: 1.0000 g | Freq: Once | INTRAVENOUS | Status: DC

## 2020-03-24 MED ORDER — SODIUM CHLORIDE 0.9 % IV SOLN: INTRAVENOUS | Status: DC

## 2020-03-24 MED ORDER — MIDAZOLAM HCL 5 MG/5ML IJ SOLN
INTRAMUSCULAR | Status: DC | PRN
  Administered 2020-03-24 (×2): 1 mg via INTRAVENOUS
  Administered 2020-03-24: 2 mg via INTRAVENOUS
  Administered 2020-03-24: 1 mg via INTRAVENOUS

## 2020-03-24 MED ORDER — ACETAMINOPHEN 500 MG PO TABS
1000.0000 mg | ORAL_TABLET | Freq: Four times a day (QID) | ORAL | Status: AC
  Administered 2020-03-25 – 2020-03-29 (×18): 1000 mg via ORAL
  Filled 2020-03-24 (×18): qty 2

## 2020-03-24 MED ORDER — MIDAZOLAM HCL (PF) 10 MG/2ML IJ SOLN
INTRAMUSCULAR | Status: AC
  Filled 2020-03-24: qty 2

## 2020-03-24 MED ORDER — PLASMA-LYTE 148 IV SOLN
INTRAVENOUS | Status: DC | PRN
  Administered 2020-03-24: 500 mL

## 2020-03-24 MED ORDER — SODIUM CHLORIDE 0.9 % IV SOLN
20.0000 ug | Freq: Once | INTRAVENOUS | Status: AC
  Administered 2020-03-24: 20 ug via INTRAVENOUS
  Filled 2020-03-24: qty 5

## 2020-03-24 MED ORDER — VANCOMYCIN HCL 1000 MG IV SOLR
INTRAVENOUS | Status: DC | PRN
  Administered 2020-03-24: 3000 mg via TOPICAL

## 2020-03-24 MED ORDER — SODIUM CHLORIDE 0.9 % IV SOLN: INTRAVENOUS | Status: DC | PRN

## 2020-03-24 MED ORDER — FAMOTIDINE IN NACL 20-0.9 MG/50ML-% IV SOLN
20.0000 mg | Freq: Two times a day (BID) | INTRAVENOUS | Status: AC
  Administered 2020-03-24: 20 mg via INTRAVENOUS
  Filled 2020-03-24: qty 50

## 2020-03-24 MED ORDER — 0.9 % SODIUM CHLORIDE (POUR BTL) OPTIME
TOPICAL | Status: DC | PRN
  Administered 2020-03-24: 5000 mL

## 2020-03-24 MED ORDER — ACETAMINOPHEN 160 MG/5ML PO SOLN: 650.0000 mg | Freq: Once | ORAL | Status: AC

## 2020-03-24 MED ORDER — BUPIVACAINE LIPOSOME 1.3 % IJ SUSP
INTRAMUSCULAR | Status: DC | PRN
  Administered 2020-03-24: 50 mL

## 2020-03-24 MED ORDER — NITROGLYCERIN IN D5W 200-5 MCG/ML-% IV SOLN: 0.0000 ug/min | INTRAVENOUS | Status: DC

## 2020-03-24 MED ORDER — CHLORHEXIDINE GLUCONATE 0.12% ORAL RINSE (MEDLINE KIT)
15.0000 mL | Freq: Two times a day (BID) | OROMUCOSAL | Status: DC
  Administered 2020-03-24: 15 mL via OROMUCOSAL

## 2020-03-24 MED ORDER — OXYCODONE HCL 5 MG PO TABS
5.0000 mg | ORAL_TABLET | ORAL | Status: DC | PRN
  Administered 2020-03-25 – 2020-03-27 (×3): 10 mg via ORAL
  Filled 2020-03-24 (×3): qty 2

## 2020-03-24 MED ORDER — SODIUM CHLORIDE 0.9% FLUSH: 10.0000 mL | INTRAVENOUS | Status: DC | PRN

## 2020-03-24 MED ORDER — MILRINONE LACTATE IN DEXTROSE 20-5 MG/100ML-% IV SOLN
0.1250 ug/kg/min | INTRAVENOUS | Status: DC
  Administered 2020-03-24 – 2020-03-25 (×2): 0.25 ug/kg/min via INTRAVENOUS
  Filled 2020-03-24 (×2): qty 100

## 2020-03-24 MED ORDER — CHLORHEXIDINE GLUCONATE CLOTH 2 % EX PADS
6.0000 | MEDICATED_PAD | Freq: Every day | CUTANEOUS | Status: DC
  Administered 2020-03-24 – 2020-03-28 (×5): 6 via TOPICAL

## 2020-03-24 MED ORDER — SODIUM CHLORIDE 0.45 % IV SOLN: INTRAVENOUS | Status: DC | PRN

## 2020-03-24 MED ORDER — POTASSIUM CHLORIDE 10 MEQ/50ML IV SOLN
10.0000 meq | INTRAVENOUS | Status: AC
  Administered 2020-03-24 (×3): 10 meq via INTRAVENOUS
  Filled 2020-03-24 (×3): qty 50

## 2020-03-24 MED ORDER — LACTATED RINGERS IV SOLN
INTRAVENOUS | Status: DC | PRN
Start: 2020-03-24 — End: 2020-03-24

## 2020-03-24 MED ORDER — SODIUM CHLORIDE 0.9 % IV SOLN
1.5000 g | Freq: Two times a day (BID) | INTRAVENOUS | Status: AC
  Administered 2020-03-24 – 2020-03-26 (×4): 1.5 g via INTRAVENOUS
  Filled 2020-03-24 (×4): qty 1.5

## 2020-03-24 MED ORDER — ASPIRIN EC 325 MG PO TBEC
325.0000 mg | DELAYED_RELEASE_TABLET | Freq: Every day | ORAL | Status: DC
  Administered 2020-03-25 – 2020-03-26 (×2): 325 mg via ORAL
  Filled 2020-03-24 (×2): qty 1

## 2020-03-24 MED ORDER — HEPARIN SODIUM (PORCINE) 1000 UNIT/ML IJ SOLN
INTRAMUSCULAR | Status: AC
  Filled 2020-03-24: qty 1

## 2020-03-24 MED ORDER — PROPOFOL 10 MG/ML IV BOLUS
INTRAVENOUS | Status: AC
  Filled 2020-03-24: qty 20

## 2020-03-24 MED ORDER — SODIUM BICARBONATE 8.4 % IV SOLN
50.0000 meq | Freq: Once | INTRAVENOUS | Status: AC
  Administered 2020-03-24: 50 meq via INTRAVENOUS

## 2020-03-24 MED ORDER — METOPROLOL TARTRATE 12.5 MG HALF TABLET
12.5000 mg | ORAL_TABLET | Freq: Two times a day (BID) | ORAL | Status: DC
  Administered 2020-03-25 – 2020-03-29 (×9): 12.5 mg via ORAL
  Filled 2020-03-24 (×9): qty 1

## 2020-03-24 MED ORDER — PANTOPRAZOLE SODIUM 40 MG PO TBEC
40.0000 mg | DELAYED_RELEASE_TABLET | Freq: Every day | ORAL | Status: DC
  Administered 2020-03-26 – 2020-03-30 (×5): 40 mg via ORAL
  Filled 2020-03-24 (×5): qty 1

## 2020-03-24 MED ORDER — DEXTROSE 50 % IV SOLN: 0.0000 mL | INTRAVENOUS | Status: DC | PRN

## 2020-03-24 MED ORDER — ALBUMIN HUMAN 5 % IV SOLN
250.0000 mL | INTRAVENOUS | Status: AC | PRN
  Administered 2020-03-24 – 2020-03-25 (×2): 12.5 g via INTRAVENOUS
  Filled 2020-03-24: qty 250

## 2020-03-24 MED ORDER — MORPHINE SULFATE (PF) 2 MG/ML IV SOLN
1.0000 mg | INTRAVENOUS | Status: DC | PRN
  Administered 2020-03-24 – 2020-03-25 (×3): 2 mg via INTRAVENOUS
  Filled 2020-03-24 (×3): qty 1

## 2020-03-24 MED ORDER — METOPROLOL TARTRATE 25 MG/10 ML ORAL SUSPENSION
12.5000 mg | Freq: Two times a day (BID) | ORAL | Status: DC
  Filled 2020-03-24 (×4): qty 5

## 2020-03-24 MED ORDER — CHLORHEXIDINE GLUCONATE 0.12 % MT SOLN: 15.0000 mL | OROMUCOSAL | Status: AC

## 2020-03-24 MED ORDER — FAMOTIDINE IN NACL 20-0.9 MG/50ML-% IV SOLN
INTRAVENOUS | Status: AC
  Filled 2020-03-24: qty 50

## 2020-03-24 MED ORDER — DOCUSATE SODIUM 100 MG PO CAPS
200.0000 mg | ORAL_CAPSULE | Freq: Every day | ORAL | Status: DC
  Administered 2020-03-25 – 2020-03-29 (×5): 200 mg via ORAL
  Filled 2020-03-24 (×6): qty 2

## 2020-03-24 MED ORDER — VANCOMYCIN HCL 1000 MG IV SOLR
INTRAVENOUS | Status: AC
  Filled 2020-03-24: qty 3000

## 2020-03-24 MED ORDER — STERILE WATER FOR INJECTION IJ SOLN
INTRAMUSCULAR | Status: AC
  Filled 2020-03-24: qty 10

## 2020-03-24 MED ORDER — ONDANSETRON HCL 4 MG/2ML IJ SOLN
4.0000 mg | Freq: Four times a day (QID) | INTRAMUSCULAR | Status: DC | PRN
  Administered 2020-03-25 – 2020-03-29 (×5): 4 mg via INTRAVENOUS
  Filled 2020-03-24 (×5): qty 2

## 2020-03-24 MED ORDER — PLASMA-LYTE A IV SOLN: INTRAVENOUS | Status: DC

## 2020-03-24 MED ORDER — ORAL CARE MOUTH RINSE
15.0000 mL | OROMUCOSAL | Status: DC
  Administered 2020-03-24 – 2020-03-25 (×6): 15 mL via OROMUCOSAL

## 2020-03-24 MED ORDER — FENTANYL CITRATE (PF) 250 MCG/5ML IJ SOLN
INTRAMUSCULAR | Status: DC | PRN
  Administered 2020-03-24 (×2): 50 ug via INTRAVENOUS
  Administered 2020-03-24: 100 ug via INTRAVENOUS
  Administered 2020-03-24: 150 ug via INTRAVENOUS
  Administered 2020-03-24: 900 ug via INTRAVENOUS

## 2020-03-24 MED ORDER — MAGNESIUM SULFATE 4 GM/100ML IV SOLN: 4.0000 g | Freq: Once | INTRAVENOUS | Status: AC

## 2020-03-24 MED ORDER — SODIUM CHLORIDE 0.9 % IV SOLN: 1.0000 g | Freq: Once | INTRAVENOUS | Status: DC

## 2020-03-24 MED ORDER — LACTATED RINGERS IV SOLN: INTRAVENOUS | Status: DC

## 2020-03-24 MED ORDER — CHLORHEXIDINE GLUCONATE 0.12 % MT SOLN
OROMUCOSAL | Status: AC
  Administered 2020-03-24: 15 mL via OROMUCOSAL
  Filled 2020-03-24: qty 15

## 2020-03-24 MED ORDER — ACETAMINOPHEN 160 MG/5ML PO SOLN
1000.0000 mg | Freq: Four times a day (QID) | ORAL | Status: AC
  Administered 2020-03-25: 1000 mg
  Filled 2020-03-24 (×2): qty 40.6

## 2020-03-24 MED ORDER — INSULIN REGULAR(HUMAN) IN NACL 100-0.9 UT/100ML-% IV SOLN: INTRAVENOUS | Status: DC

## 2020-03-24 MED ORDER — MAGNESIUM SULFATE 4 GM/100ML IV SOLN
INTRAVENOUS | Status: AC
  Administered 2020-03-24: 4 g via INTRAVENOUS
  Filled 2020-03-24: qty 100

## 2020-03-24 MED ORDER — SODIUM CHLORIDE 0.9 % IV SOLN: 250.0000 mL | INTRAVENOUS | Status: DC

## 2020-03-24 MED ORDER — EPINEPHRINE HCL 5 MG/250ML IV SOLN IN NS
0.5000 ug/min | INTRAVENOUS | Status: DC
  Administered 2020-03-24: 3 ug/min via INTRAVENOUS
  Filled 2020-03-24: qty 250

## 2020-03-24 MED ORDER — PHENYLEPHRINE HCL (PRESSORS) 10 MG/ML IV SOLN
INTRAVENOUS | Status: DC | PRN
  Administered 2020-03-24: 40 ug via INTRAVENOUS

## 2020-03-24 MED ORDER — SODIUM CHLORIDE 0.9% FLUSH: 3.0000 mL | INTRAVENOUS | Status: DC | PRN

## 2020-03-24 MED ORDER — HEPARIN SODIUM (PORCINE) 1000 UNIT/ML IJ SOLN
INTRAMUSCULAR | Status: DC | PRN
  Administered 2020-03-24: 22000 [IU] via INTRAVENOUS

## 2020-03-24 MED ORDER — ROCURONIUM BROMIDE 10 MG/ML (PF) SYRINGE
PREFILLED_SYRINGE | INTRAVENOUS | Status: AC
  Filled 2020-03-24: qty 20

## 2020-03-24 MED ORDER — DEXMEDETOMIDINE HCL IN NACL 400 MCG/100ML IV SOLN
0.0000 ug/kg/h | INTRAVENOUS | Status: DC
  Administered 2020-03-24: 0.5 ug/kg/h via INTRAVENOUS
  Filled 2020-03-24: qty 100

## 2020-03-24 MED ORDER — HEMOSTATIC AGENTS (NO CHARGE) OPTIME
TOPICAL | Status: DC | PRN
  Administered 2020-03-24 (×4): 1 via TOPICAL

## 2020-03-24 MED ORDER — BISACODYL 10 MG RE SUPP: 10.0000 mg | Freq: Every day | RECTAL | Status: DC

## 2020-03-24 MED ORDER — POTASSIUM CHLORIDE 10 MEQ/50ML IV SOLN
10.0000 meq | INTRAVENOUS | Status: AC
  Administered 2020-03-24 (×3): 10 meq via INTRAVENOUS

## 2020-03-24 MED ORDER — BUPIVACAINE HCL (PF) 0.5 % IJ SOLN
INTRAMUSCULAR | Status: AC
  Filled 2020-03-24: qty 30

## 2020-03-24 MED ORDER — TRAMADOL HCL 50 MG PO TABS
50.0000 mg | ORAL_TABLET | ORAL | Status: DC | PRN
  Administered 2020-03-28: 50 mg via ORAL
  Filled 2020-03-24: qty 1

## 2020-03-24 MED ORDER — ASPIRIN 81 MG PO CHEW: 324.0000 mg | CHEWABLE_TABLET | Freq: Every day | ORAL | Status: DC

## 2020-03-24 MED ORDER — METOPROLOL TARTRATE 5 MG/5ML IV SOLN
2.5000 mg | INTRAVENOUS | Status: DC | PRN
  Filled 2020-03-24: qty 5

## 2020-03-24 MED ORDER — SODIUM CHLORIDE 0.9% FLUSH
3.0000 mL | Freq: Two times a day (BID) | INTRAVENOUS | Status: DC
  Administered 2020-03-25 – 2020-03-29 (×6): 3 mL via INTRAVENOUS

## 2020-03-24 SURGICAL SUPPLY — 125 items
ADAPTER CARDIO PERF ANTE/RETRO (ADAPTER) ×5 IMPLANT
APPLIER CLIP 9.375 SM OPEN (CLIP)
BAG DECANTER FOR FLEXI CONT (MISCELLANEOUS) ×5 IMPLANT
BENZOIN TINCTURE PRP APPL 2/3 (GAUZE/BANDAGES/DRESSINGS) ×10 IMPLANT
BLADE CLIPPER SURG (BLADE) IMPLANT
BLADE STERNUM SYSTEM 6 (BLADE) ×5 IMPLANT
BLADE SURG 15 STRL LF DISP TIS (BLADE) IMPLANT
BLADE SURG 15 STRL SS (BLADE)
BLADE SURG SZ11 CARB STEEL (BLADE) ×5 IMPLANT
BNDG ELASTIC 4X5.8 VLCR STR LF (GAUZE/BANDAGES/DRESSINGS) ×5 IMPLANT
BNDG ELASTIC 6X5.8 VLCR STR LF (GAUZE/BANDAGES/DRESSINGS) ×5 IMPLANT
BNDG GAUZE ELAST 4 BULKY (GAUZE/BANDAGES/DRESSINGS) ×5 IMPLANT
CANISTER SUCT 3000ML PPV (MISCELLANEOUS) ×5 IMPLANT
CANISTER WOUNDNEG PRESSURE 500 (CANNISTER) ×5 IMPLANT
CANNULA NON VENT 18FR 12 (CANNULA) ×5 IMPLANT
CATH CPB KIT HENDRICKSON (MISCELLANEOUS) ×5 IMPLANT
CATH ROBINSON RED A/P 18FR (CATHETERS) ×10 IMPLANT
CLIP APPLIE 9.375 SM OPEN (CLIP) IMPLANT
CLIP FOGARTY SPRING 6M (CLIP) ×5 IMPLANT
CLIP RETRACTION 3.0MM CORONARY (MISCELLANEOUS) ×10 IMPLANT
CLIP VESOCCLUDE MED 24/CT (CLIP) IMPLANT
CLIP VESOCCLUDE SM WIDE 24/CT (CLIP) ×20 IMPLANT
CNTNR URN SCR LID CUP LEK RST (MISCELLANEOUS) ×4 IMPLANT
CONN ST 1/4X3/8  BEN (MISCELLANEOUS) ×20
CONN ST 1/4X3/8 BEN (MISCELLANEOUS) ×16 IMPLANT
CONT SPEC 4OZ STRL OR WHT (MISCELLANEOUS) ×5
COVER MAYO STAND STRL (DRAPES) IMPLANT
CUFF TOURN SGL QUICK 18X4 (TOURNIQUET CUFF) IMPLANT
CUFF TOURN SGL QUICK 24 (TOURNIQUET CUFF)
CUFF TRNQT CYL 24X4X16.5-23 (TOURNIQUET CUFF) IMPLANT
DEFOGGER ANTIFOG KIT (MISCELLANEOUS) ×5 IMPLANT
DERMABOND ADVANCED (GAUZE/BANDAGES/DRESSINGS) ×3
DERMABOND ADVANCED .7 DNX12 (GAUZE/BANDAGES/DRESSINGS) ×12 IMPLANT
DRAIN CHANNEL 28F RND 3/8 FF (WOUND CARE) ×20 IMPLANT
DRAPE CARDIOVASCULAR INCISE (DRAPES) ×5
DRAPE EXTREMITY T 121X128X90 (DISPOSABLE) IMPLANT
DRAPE HALF SHEET 40X57 (DRAPES) IMPLANT
DRAPE SLUSH/WARMER DISC (DRAPES) ×5 IMPLANT
DRAPE SRG 135X102X78XABS (DRAPES) ×4 IMPLANT
DRSG AQUACEL AG ADV 3.5X14 (GAUZE/BANDAGES/DRESSINGS) ×5 IMPLANT
ELECT CAUTERY BLADE 6.4 (BLADE) ×5 IMPLANT
ELECT REM PT RETURN 9FT ADLT (ELECTROSURGICAL) ×10
ELECTRODE REM PT RTRN 9FT ADLT (ELECTROSURGICAL) ×8 IMPLANT
FELT TEFLON 1X6 (MISCELLANEOUS) ×5 IMPLANT
GAUZE SPONGE 4X4 12PLY STRL (GAUZE/BANDAGES/DRESSINGS) ×10 IMPLANT
GAUZE SPONGE 4X4 12PLY STRL LF (GAUZE/BANDAGES/DRESSINGS) ×10 IMPLANT
GEL ULTRASOUND 20GR AQUASONIC (MISCELLANEOUS) IMPLANT
GLOVE BIO SURGEON STRL SZ 6 (GLOVE) ×5 IMPLANT
GLOVE BIO SURGEON STRL SZ8 (GLOVE) ×10 IMPLANT
GLOVE BIOGEL PI IND STRL 6 (GLOVE) ×4 IMPLANT
GLOVE BIOGEL PI IND STRL 7.5 (GLOVE) ×4 IMPLANT
GLOVE BIOGEL PI IND STRL 8.5 (GLOVE) ×4 IMPLANT
GLOVE BIOGEL PI IND STRL 9 (GLOVE) ×4 IMPLANT
GLOVE BIOGEL PI INDICATOR 6 (GLOVE) ×1
GLOVE BIOGEL PI INDICATOR 7.5 (GLOVE) ×1
GLOVE BIOGEL PI INDICATOR 8.5 (GLOVE) ×1
GLOVE BIOGEL PI INDICATOR 9 (GLOVE) ×1
GLOVE NEODERM STRL 7.5  LF PF (GLOVE) ×16
GLOVE NEODERM STRL 7.5 LF PF (GLOVE) ×16 IMPLANT
GLOVE SURG NEODERM 7.5  LF PF (GLOVE) ×4
GLOVE SURG SS PI 6.0 STRL IVOR (GLOVE) ×10 IMPLANT
GOWN STRL REUS W/ TWL LRG LVL3 (GOWN DISPOSABLE) ×28 IMPLANT
GOWN STRL REUS W/ TWL XL LVL3 (GOWN DISPOSABLE) ×8 IMPLANT
GOWN STRL REUS W/TWL LRG LVL3 (GOWN DISPOSABLE) ×35
GOWN STRL REUS W/TWL XL LVL3 (GOWN DISPOSABLE) ×10
HEMOSTAT POWDER SURGIFOAM 1G (HEMOSTASIS) ×10 IMPLANT
KIT BASIN OR (CUSTOM PROCEDURE TRAY) ×5 IMPLANT
KIT DRSG PREVENA PLUS 7DAY 125 (MISCELLANEOUS) ×5 IMPLANT
KIT SUCTION CATH 14FR (SUCTIONS) ×5 IMPLANT
KIT TURNOVER KIT B (KITS) ×5 IMPLANT
KIT VASOVIEW HEMOPRO 2 VH 4000 (KITS) ×5 IMPLANT
MARKER GRAFT CORONARY BYPASS (MISCELLANEOUS) ×15 IMPLANT
NEEDLE 18GX1X1/2 (RX/OR ONLY) (NEEDLE) ×5 IMPLANT
NS IRRIG 1000ML POUR BTL (IV SOLUTION) ×25 IMPLANT
PACK E OPEN HEART (SUTURE) ×5 IMPLANT
PACK OPEN HEART (CUSTOM PROCEDURE TRAY) ×5 IMPLANT
PACK SPY-PHI (KITS) ×5 IMPLANT
PAD ARMBOARD 7.5X6 YLW CONV (MISCELLANEOUS) ×10 IMPLANT
PAD ELECT DEFIB RADIOL ZOLL (MISCELLANEOUS) ×5 IMPLANT
PENCIL BUTTON HOLSTER BLD 10FT (ELECTRODE) ×5 IMPLANT
POSITIONER HEAD DONUT 9IN (MISCELLANEOUS) ×5 IMPLANT
POWDER SURGICEL 3.0 GRAM (HEMOSTASIS) ×5 IMPLANT
PUNCH AORTIC ROTATE 4.5MM 8IN (MISCELLANEOUS) ×5 IMPLANT
SEALANT SURG COSEAL 8ML (VASCULAR PRODUCTS) ×5 IMPLANT
SET CARDIOPLEGIA MPS 5001102 (MISCELLANEOUS) ×5 IMPLANT
SHEARS HARMONIC 9CM CVD (BLADE) IMPLANT
SLEEVE SURGEON STRL (DRAPES) ×5 IMPLANT
SUPPORT HEART JANKE-BARRON (MISCELLANEOUS) ×5 IMPLANT
SUT BONE WAX W31G (SUTURE) ×5 IMPLANT
SUT MNCRL AB 3-0 PS2 18 (SUTURE) ×10 IMPLANT
SUT MNCRL AB 4-0 PS2 18 (SUTURE) ×5 IMPLANT
SUT PDS AB 1 CTX 36 (SUTURE) ×10 IMPLANT
SUT PROLENE 3 0 SH DA (SUTURE) IMPLANT
SUT PROLENE 5 0 C 1 36 (SUTURE) IMPLANT
SUT PROLENE 5 0 C1 (SUTURE) ×5 IMPLANT
SUT PROLENE 6 0 C 1 30 (SUTURE) ×30 IMPLANT
SUT PROLENE 7 0 BV1 MDA (SUTURE) ×5 IMPLANT
SUT PROLENE 8 0 BV175 6 (SUTURE) ×10 IMPLANT
SUT PROLENE BLUE 7 0 (SUTURE) ×5 IMPLANT
SUT SILK  1 MH (SUTURE) ×10
SUT SILK 1 MH (SUTURE) ×8 IMPLANT
SUT SILK 2 0 SH CR/8 (SUTURE) IMPLANT
SUT SILK 3 0 SH CR/8 (SUTURE) IMPLANT
SUT STEEL 6MS V (SUTURE) ×10 IMPLANT
SUT STEEL SZ 6 DBL 3X14 BALL (SUTURE) IMPLANT
SUT VIC AB 2-0 CT1 27 (SUTURE)
SUT VIC AB 2-0 CT1 TAPERPNT 27 (SUTURE) IMPLANT
SUT VIC AB 2-0 CTX 27 (SUTURE) IMPLANT
SUT VIC AB 3-0 SH 27 (SUTURE)
SUT VIC AB 3-0 SH 27X BRD (SUTURE) IMPLANT
SUT VIC AB 3-0 X1 27 (SUTURE) IMPLANT
SYR 10ML LL (SYRINGE) IMPLANT
SYR 30ML LL (SYRINGE) ×10 IMPLANT
SYR 3ML LL SCALE MARK (SYRINGE) ×5 IMPLANT
SYR 50ML SLIP (SYRINGE) IMPLANT
SYSTEM SAHARA CHEST DRAIN ATS (WOUND CARE) ×5 IMPLANT
TOWEL GREEN STERILE (TOWEL DISPOSABLE) ×5 IMPLANT
TOWEL GREEN STERILE FF (TOWEL DISPOSABLE) ×5 IMPLANT
TRAY FOLEY SLVR 16FR TEMP STAT (SET/KITS/TRAYS/PACK) ×5 IMPLANT
TUBE CONNECTING 20X1/4 (TUBING) ×5 IMPLANT
TUBING LAP HI FLOW INSUFFLATIO (TUBING) ×5 IMPLANT
UNDERPAD 30X36 HEAVY ABSORB (UNDERPADS AND DIAPERS) ×10 IMPLANT
WATER STERILE IRR 1000ML POUR (IV SOLUTION) ×10 IMPLANT
WATER STERILE IRR 1000ML UROMA (IV SOLUTION) ×5 IMPLANT
YANKAUER SUCT BULB TIP NO VENT (SUCTIONS) ×5 IMPLANT

## 2020-03-24 NOTE — Progress Notes (Signed)
EVENING ROUNDS NOTE :     [address]       [place] [address]             [phone]                 Day of Surgery Procedure(s) (LRB): CORONARY ARTERY BYPASS GRAFTING (CABG) using LIMA to LAD (m); RIMA to RAMUS; Endoscopic Right Greater Saphenous Vein: SVG to Diag1; SVG to PLB (right); and SVG to PL (left). (N/A) TRANSESOPHAGEAL ECHOCARDIOGRAM (TEE) (N/A) INDOCYANINE GREEN FLUORESCENCE IMAGING (ICG) (N/A) ENDOVEIN HARVEST OF GREATER SAPHENOUS VEIN (Right)   Total Length of Stay:  LOS: 2 days  Events:  1458mls out Receiving blood and product Normotensive      BP 105/63   Pulse 95   Temp (!) 97 F (36.1 C)   Resp 19   Ht 5' 4.5" (1.638 m)   Wt 64.4 kg   SpO2 100%   BMI 23.98 kg/m   PAP: (17-26)/(4-15) 22/13 CO:  [2.2 L/min-2.8 L/min] 2.8 L/min CI:  [1.2 L/min/m2-1.7 L/min/m2] 1.7 L/min/m2  FiO2 (%):  [50 %] 50 %  . sodium chloride Stopped (03/24/20 1445)  . [START ON 03/25/2020] sodium chloride    . sodium chloride 20 mL/hr at 03/24/20 1517  . albumin human 12.5 g (03/24/20 1516)  . cefUROXime (ZINACEF)  IV    . desmopressin (DDAVP) IV 20 mcg (03/24/20 1633)  . dexmedetomidine (PRECEDEX) IV infusion 0.5 mcg/kg/hr (03/24/20 1600)  . electrolyte-A 75 mL/hr at 03/24/20 1608  . epinephrine 5 mcg/min (03/24/20 1600)  . famotidine (PEPCID) IV 100 mL/hr at 03/24/20 1401  . insulin 4.4 mL/hr at 03/24/20 1600  . lactated ringers    . lactated ringers 120 mL/hr at 03/24/20 1401  . lactated ringers 20 mL/hr at 03/24/20 1600  . magnesium sulfate 20 mL/hr at 03/24/20 1600  . milrinone 0.25 mcg/kg/min (03/24/20 1600)  . nitroGLYCERIN Stopped (03/24/20 1348)  . phenylephrine (NEO-SYNEPHRINE) Adult infusion 100 mcg/min (03/24/20 1600)  . potassium chloride Stopped (03/24/20 1632)  . vancomycin      No intake/output data recorded.   CBC Latest Ref Rng & Units 03/24/2020 03/24/2020 03/24/2020  WBC 4.0 - 10.5 K/uL 11.2(H) - -  Hemoglobin 12.0 - 15.0 g/dL  8.1(L) 7.5(L) 8.8(L)  Hematocrit 36 - 46 % 25.0(L) 22.0(L) 26.0(L)  Platelets 150 - 400 K/uL 110(L) - -    BMP Latest Ref Rng & Units 03/24/2020 03/24/2020 03/24/2020  Glucose 70 - 99 mg/dL 120(H) - 134(H)  BUN 8 - 23 mg/dL 14 - 13  Creatinine 0.44 - 1.00 mg/dL 0.40(L) - 0.50  BUN/Creat Ratio 6 - 22 (calc) - - -  Sodium 135 - 145 mmol/L 143 143 140  Potassium 3.5 - 5.1 mmol/L 3.8 4.2 4.6  Chloride 98 - 111 mmol/L 106 - 106  CO2 22 - 32 mmol/L - - -  Calcium 8.9 - 10.3 mg/dL - - -    ABG    Component Value Date/Time   PHART 7.434 03/24/2020 1148   PCO2ART 37.3 03/24/2020 1148   PO2ART 323 (H) 03/24/2020 1148   HCO3 25.0 03/24/2020 1148   TCO2 26 03/24/2020 1232   ACIDBASEDEF 1.0 03/24/2020 0952   O2SAT 100.0 03/24/2020 [place], MD 03/24/2020 4:51 PM

## 2020-03-24 NOTE — Anesthesia Preprocedure Evaluation (Signed)
Anesthesia Evaluation  Patient identified by MRN, date of birth, ID band Patient awake    Reviewed: Allergy & Precautions, NPO status , Patient's Chart, lab work & pertinent test results  Airway Mallampati: I  TM Distance: >3 FB Neck ROM: Full    Dental   Pulmonary former smoker,    Pulmonary exam normal        Cardiovascular hypertension, Pt. on medications + Past MI  Normal cardiovascular exam     Neuro/Psych    GI/Hepatic GERD  Medicated and Controlled,  Endo/Other  diabetes, Type 2  Renal/GU      Musculoskeletal   Abdominal   Peds  Hematology   Anesthesia Other Findings   Reproductive/Obstetrics                             Anesthesia Physical Anesthesia Plan  ASA: III  Anesthesia Plan: General   Post-op Pain Management:    Induction: Intravenous  PONV Risk Score and Plan: 3 and Treatment may vary due to age or medical condition  Airway Management Planned: Oral ETT  Additional Equipment: Arterial line, PA Cath, TEE and Ultrasound Guidance Line Placement  Intra-op Plan:   Post-operative Plan: Post-operative intubation/ventilation  Informed Consent: I have reviewed the patients History and Physical, chart, labs and discussed the procedure including the risks, benefits and alternatives for the proposed anesthesia with the patient or authorized representative who has indicated his/her understanding and acceptance.       Plan Discussed with: CRNA and Surgeon  Anesthesia Plan Comments:         Anesthesia Quick Evaluation

## 2020-03-24 NOTE — Progress Notes (Signed)
  Echocardiogram Echocardiogram Transesophageal has been performed.  Michiel Cowboy 03/24/2020, 8:26 AM

## 2020-03-24 NOTE — Anesthesia Procedure Notes (Signed)
Procedure Name: Intubation Date/Time: 03/24/2020 7:31 AM Performed by: Gaylene Brooks, CRNA Pre-anesthesia Checklist: Patient identified, Emergency Drugs available, Suction available and Patient being monitored Patient Re-evaluated:Patient Re-evaluated prior to induction Oxygen Delivery Method: Circle system utilized Preoxygenation: Pre-oxygenation with 100% oxygen Induction Type: IV induction Ventilation: Mask ventilation without difficulty Laryngoscope Size: Miller and 2 Grade View: Grade I Tube type: Oral Tube size: 8.0 mm Number of attempts: 1 Airway Equipment and Method: Stylet and Oral airway Placement Confirmation: ETT inserted through vocal cords under direct vision,  positive ETCO2 and breath sounds checked- equal and bilateral Secured at: 22 cm Tube secured with: Tape Dental Injury: Teeth and Oropharynx as per pre-operative assessment

## 2020-03-24 NOTE — Anesthesia Postprocedure Evaluation (Signed)
Anesthesia Post Note  Patient: [name]  Procedure(s) Performed: CORONARY ARTERY BYPASS GRAFTING (CABG) using LIMA to LAD (m); RIMA to RAMUS; Endoscopic Right Greater Saphenous Vein: SVG to Diag1; SVG to PLB (right); and SVG to PL (left). (N/A Chest) TRANSESOPHAGEAL ECHOCARDIOGRAM (TEE) (N/A ) INDOCYANINE GREEN FLUORESCENCE IMAGING (ICG) (N/A ) ENDOVEIN HARVEST OF GREATER SAPHENOUS VEIN (Right Leg Upper)     Patient location during evaluation: SICU Anesthesia Type: General Level of consciousness: sedated Pain management: pain level controlled Vital Signs Assessment: post-procedure vital signs reviewed and stable Respiratory status: patient remains intubated per anesthesia plan Cardiovascular status: stable Postop Assessment: no apparent nausea or vomiting Anesthetic complications: no   No complications documented.  Last Vitals:  Vitals:   03/24/20 0529 03/24/20 1350  BP: (!) 173/67 (!) 104/41  Pulse: 94 90  Resp:  12  Temp:    SpO2:  100%    Last Pain:  Vitals:   03/24/20 0241  TempSrc: Oral  PainSc:                  [name] DAVID

## 2020-03-24 NOTE — Anesthesia Procedure Notes (Signed)
Central Venous Catheter Insertion Performed by: Audry Pili, MD, anesthesiologist Start/End7/09/2019 6:42 AM, 03/24/2020 6:45 AM Patient location: Pre-op. Preanesthetic checklist: patient identified, IV checked, risks and benefits discussed, surgical consent, monitors and equipment checked, pre-op evaluation, timeout performed and anesthesia consent Position: Trendelenburg Hand hygiene performed  and maximum sterile barriers used  Total catheter length 10. PA cath was placed.Swan type:thermodilution PA Cath depth:53 Procedure performed without using ultrasound guided technique. Attempts: 1 Patient tolerated the procedure well with no immediate complications.

## 2020-03-24 NOTE — Op Note (Signed)
CARDIOTHORACIC SURGERY OPERATIVE NOTE  Date of Procedure: 03/24/2020  Preoperative Diagnosis: Severe 3-vessel Coronary Artery Disease, s/p NSTEMI  Postoperative Diagnosis: Same  Procedure:    Coronary Artery Bypass Grafting x 5   Left Internal Mammary Artery to Distal Left Anterior Descending Coronary Artery; LAD endarterectomy with vein patch angioplasty; Saphenous Vein Graft to right Posterolateral Coronary Artery; Saphenous Vein Graft to distal Obtuse Marginal Branch of Left Circumflex Coronary Artery; Sapheonous Vein Graft to  Diagonal Branch Coronary Artery; pedicled RIMA to ramus intermedius coronary artery; Endoscopic Vein Harvest from right Thigh and Lower Leg Completion graft surveillance with indocyanine green fluorescence angiography Bilateral internal mammary artery harvesting Multilevel level rib block with Exparel solution  Surgeon: B. Murvin Natal, MD  Assistant: Joellyn Rued PA-C  Anesthesia: get  Operative Findings:  Preserved left ventricular systolic function  Good quality  internal mammary artery conduits  Good quality saphenous vein conduit  Fair quality target vessels for grafting    BRIEF CLINICAL NOTE AND INDICATIONS FOR SURGERY  79 year old lady presented with crescendo angina.  Her evaluation demonstrated NSTEMI.  She was taken to the cardiac Cath Lab where left heart catheterization and coronary angiogram demonstrated severe multivessel coronary artery disease.  She is taken out of the operating room for surgical revascularization.   DETAILS OF THE OPERATIVE PROCEDURE  Preparation:  The patient is brought to the operating room on the above mentioned date and central monitoring was established by the anesthesia team including placement of Swan-Ganz catheter and radial arterial line. The patient is placed in the supine position on the operating table.  Intravenous antibiotics are administered. General endotracheal anesthesia is induced uneventfully. A  Foley catheter is placed.  Baseline transesophageal echocardiogram was performed.  Findings were notable for thickened LV, no valvular abnormalities and preserved ejection fraction  The patient's chest, abdomen, both groins, and both lower extremities are prepared and draped in a sterile manner. A time out procedure is performed.   Surgical Approach and Conduit Harvest:  A median sternotomy incision was performed and the left internal mammary artery is dissected from the chest wall and prepared for bypass grafting. The left internal mammary artery is notably good quality conduit. Simultaneously, the greater saphenous vein is obtained from the patient's right thigh and lower leg using endoscopic vein harvest technique. The saphenous vein is notably good quality conduit. After removal of the saphenous vein, the small surgical incisions in the lower extremity are closed with absorbable suture.  Next, attention of the right hemithorax of the right internal mammary artery and its pedicle mobilized in a standard fashion.  When this completed full dose heparin is given following systemic heparinization, the internal mammary arteries were transected distally noted to have excellent flow.  Both were then treated with a solution of papaverine.  Multilevel rib block with Exparel solution was performed by injecting this into the visible rib spaces seen at the time of mammary harvesting   Extracorporeal Cardiopulmonary Bypass and Myocardial Protection:  The pericardium is opened. The ascending aorta is nondiseased in appearance. The ascending aorta and the right atrium are cannulated for cardiopulmonary bypass.  Adequate heparinization is verified.      Cardiopulmonary bypass was begun and the surface of the heart is inspected. Distal target vessels are selected for coronary artery bypass grafting. A cardioplegia cannula is placed in the ascending aorta.   The patient is allowed to cool passively to 34C  systemic temperature.  The aortic cross clamp is applied and cold blood cardioplegia is  delivered initially in an antegrade fashion through the aortic root.   Iced saline slush is applied for topical hypothermia.  The initial cardioplegic arrest is rapid with early diastolic arrest.  Repeat doses of cardioplegia are administered intermittently throughout the entire cross clamp portion of the operation through the aortic root  and through subsequently placed vein grafts in order to maintain completely flat electrocardiogram.   Coronary Artery Bypass Grafting:   The posterolateral branch of the right coronary artery was grafted using a reversed saphenous vein graft in an end-to-side fashion.  At the site of distal anastomosis the target vessel was good quality and measured approximately 1.5 mm in diameter.  The distal obtuse marginal branch of the left circumflex coronary artery was grafted using a reversed saphenous vein graft in an end-to-side fashion.  At the site of distal anastomosis the target vessel was fair quality and measured approximately 1.5 mm in diameter.  The  diagonal branch of the left anterior descending coronary artery was grafted using a reversed saphenous vein graft in an end-to-side fashion.  At the site of distal anastomosis the target vessel was good quality and measured approximately 1.5 mm in diameter.  The ramus intermedius coronary artery was grafted in and end-to-side fashion using the pedicled right internal mammary artery which was brought underneath the aorta via the transverse sinus. at the site of distal anastomosis the target vessel was good quality and measured approximately 1.5 mm in diameter. Anastomotic patency and runoff was confirmed with indocyanine green fluorescence imaging (SPY).  The distal left anterior coronary artery was heavily diseased.  Upon opening it an adequate lumen even at the soft spot could not be demonstrated.  Endarterectomy was therefore  undertaken taking care to get a feathered edge proximally and distally.  The resulting defect was patched with a piece of vein.  Next the left internal mammary artery was anastomosed to the middle of the vein patch hood with a running suture of 7-0 Prolene.   At the site of distal anastomosis the target vessel was fair quality and measured approximately 1.5 mm in diameter.Anastomotic patency and runoff was confirmed with indocyanine green fluorescence imaging (SPY).  All proximal vein graft anastomoses were placed directly to the ascending aorta prior to removal of the aortic cross clamp.  De-airing procedures were performed and the aortic cross-clamp was removed   Procedure Completion:  All proximal and distal coronary anastomoses were inspected for hemostasis and appropriate graft orientation. Epicardial pacing wires are fixed to the right ventricular outflow tract and to the right atrial appendage. The patient is rewarmed to 37C temperature. The patient is weaned and disconnected from cardiopulmonary bypass.  The patient's rhythm at separation from bypass was sinus bradycardia.  The patient was weaned from cardiopulmonary bypass without any inotropic support. Followup transesophageal echocardiogram performed after separation from bypass revealed no changes from the preoperative exam.  The aortic and venous cannula were removed uneventfully. Protamine was administered to reverse the anticoagulation. The mediastinum and pleural space were inspected for hemostasis and irrigated with saline solution. The mediastinum and bilateral pleural space were drained using fluted chest tubes placed through separate stab incisions inferiorly.  The soft tissues anterior to the aorta were reapproximated loosely. The sternum is closed with double strength sternal wire. The soft tissues anterior to the sternum were closed in multiple layers and the skin is closed with a running subcuticular skin closure.  The  post-bypass portion of the operation was notable for stable rhythm and hemodynamics.  Disposition:  The patient tolerated the procedure well and is transported to the surgical intensive care in stable condition. There are no intraoperative complications. All sponge instrument and needle counts are verified correct at completion of the operation.    Jayme Cloud, MD 03/24/2020 2:40 PM

## 2020-03-24 NOTE — H&P (Signed)
History and Physical Interval Note:  03/24/2020 7:07 AM  [name]  has presented today for surgery, with the diagnosis of CAD.  The various methods of treatment have been discussed with the patient and family. After consideration of risks, benefits and other options for treatment, the patient has consented to  Procedure(s) with comments: CORONARY ARTERY BYPASS GRAFTING (CABG) (N/A) - BILATERAL IMA RADIAL ARTERY HARVEST (Left) TRANSESOPHAGEAL ECHOCARDIOGRAM (TEE) (N/A) INDOCYANINE GREEN FLUORESCENCE IMAGING (ICG) (N/A) as a surgical intervention.  The patient's history has been reviewed, patient examined, no change in status, stable for surgery.  I have reviewed the patient's chart and labs.  Questions were answered to the patient's satisfaction.     [name]

## 2020-03-24 NOTE — Brief Op Note (Signed)
03/21/2020 - 03/24/2020  12:20 PM  PATIENT:  [name]  79 y.o. female  PRE-OPERATIVE DIAGNOSIS:  CORONARY ARTERY DISEASE, multivessel s/p NSTEMI  POST-OPERATIVE DIAGNOSIS:  same  PROCEDURE:  Procedure(s) with comments: CORONARY ARTERY BYPASS GRAFTING (CABG) (N/A) - BILATERAL IMA TRANSESOPHAGEAL ECHOCARDIOGRAM (TEE) (N/A) INDOCYANINE GREEN FLUORESCENCE IMAGING (ICG) (N/A) ENDOVEIN HARVEST OF GREATER SAPHENOUS VEIN (Right)   LIMA to LAD, endarterectomy with vein patch RIMA to Ramus SVG to Diag1 SVG to PL (right) SVG to PL (left)  SURGEON:  Surgeon(s) and Role:    * [name], MD - Primary  PHYSICIAN ASSISTANT:   Nicholes Rough, PA-C   ANESTHESIA:   general  EBL: per anes  BLOOD ADMINISTERED: one unit pRBC  DRAINS: routine   LOCAL MEDICATIONS USED:  NONE  SPECIMEN:  No Specimen  DISPOSITION OF SPECIMEN:  PATHOLOGY  COUNTS:  YES  TOURNIQUET:  * No tourniquets in log *  DICTATION: .Dragon Dictation  PLAN OF CARE: Admit to inpatient   PATIENT DISPOSITION:  ICU - intubated and hemodynamically stable.   Delay start of Pharmacological VTE agent (>24hrs) due to surgical blood loss or risk of bleeding: yes

## 2020-03-24 NOTE — Anesthesia Procedure Notes (Signed)
Central Venous Catheter Insertion Performed by: Audry Pili, MD, anesthesiologist Start/End7/09/2019 6:33 AM, 03/24/2020 6:45 AM Patient location: Pre-op. Preanesthetic checklist: patient identified, IV checked, risks and benefits discussed, surgical consent, monitors and equipment checked, pre-op evaluation, timeout performed and anesthesia consent Position: Trendelenburg Lidocaine 1% used for infiltration and patient sedated Hand hygiene performed , maximum sterile barriers used  and Seldinger technique used Catheter size: 8.5 Fr Central line was placed.MAC introducer Procedure performed using ultrasound guided technique. Ultrasound Notes:anatomy identified, needle tip was noted to be adjacent to the nerve/plexus identified, no ultrasound evidence of intravascular and/or intraneural injection and image(s) printed for medical record Attempts: 1 Following insertion, line sutured, dressing applied and Biopatch. Post procedure assessment: blood return through all ports, no air and free fluid flow  Patient tolerated the procedure well with no immediate complications.

## 2020-03-24 NOTE — Transfer of Care (Signed)
Immediate Anesthesia Transfer of Care Note  Patient: [name]  Procedure(s) Performed: CORONARY ARTERY BYPASS GRAFTING (CABG) using LIMA to LAD (m); RIMA to RAMUS; Endoscopic Right Greater Saphenous Vein: SVG to Diag1; SVG to PLB (right); and SVG to PL (left). (N/A Chest) TRANSESOPHAGEAL ECHOCARDIOGRAM (TEE) (N/A ) INDOCYANINE GREEN FLUORESCENCE IMAGING (ICG) (N/A ) ENDOVEIN HARVEST OF GREATER SAPHENOUS VEIN (Right Leg Upper)  Patient Location: SICU  Anesthesia Type:General  Level of Consciousness: sedated  Airway & Oxygen Therapy: Patient remains intubated per anesthesia plan and Patient placed on Ventilator (see vital sign flow sheet for setting)  Post-op Assessment: Report given to RN and Post -op Vital signs reviewed and stable  Post vital signs: Reviewed and stable  Last Vitals:  Vitals Value Taken Time  BP    Temp    Pulse    Resp    SpO2      Last Pain:  Vitals:   03/24/20 0241  TempSrc: Oral  PainSc:       Patients Stated Pain Goal: 0 (97/28/20 6015)  Complications: No complications documented.

## 2020-03-24 NOTE — Anesthesia Procedure Notes (Signed)
Arterial Line Insertion Start/End7/09/2019 6:45 AM, 03/24/2020 6:55 AM Performed by: Lillia Abed, MD, Gaylene Brooks, CRNA, CRNA  Patient location: Pre-op. Preanesthetic checklist: patient identified, IV checked, site marked, risks and benefits discussed, surgical consent, monitors and equipment checked, pre-op evaluation, timeout performed and anesthesia consent Lidocaine 1% used for infiltration and patient sedated Right, radial was placed Catheter size: 20 G Hand hygiene performed  and maximum sterile barriers used  Allen's test indicative of satisfactory collateral circulation Attempts: 2 Procedure performed without using ultrasound guided technique. Following insertion, Biopatch and dressing applied. Post procedure assessment: normal  Patient tolerated the procedure well with no immediate complications.

## 2020-03-25 ENCOUNTER — Telehealth: Payer: Self-pay | Admitting: Cardiovascular Disease

## 2020-03-25 ENCOUNTER — Encounter (HOSPITAL_COMMUNITY): Payer: Self-pay | Admitting: Cardiothoracic Surgery

## 2020-03-25 ENCOUNTER — Inpatient Hospital Stay (HOSPITAL_COMMUNITY): Payer: Medicare Other

## 2020-03-25 LAB — POCT I-STAT 7, (LYTES, BLD GAS, ICA,H+H)
Acid-base deficit: 4 mmol/L — ABNORMAL HIGH (ref 0.0–2.0)
Acid-base deficit: 3 mmol/L — ABNORMAL HIGH (ref 0.0–2.0)
Acid-base deficit: 5 mmol/L — ABNORMAL HIGH (ref 0.0–2.0)
Bicarbonate: 21.7 mmol/L (ref 20.0–28.0)
Bicarbonate: 22.7 mmol/L (ref 20.0–28.0)
Bicarbonate: 20.8 mmol/L (ref 20.0–28.0)
Calcium, Ion: 1.12 mmol/L — ABNORMAL LOW (ref 1.15–1.40)
Calcium, Ion: 1.1 mmol/L — ABNORMAL LOW (ref 1.15–1.40)
Calcium, Ion: 1.12 mmol/L — ABNORMAL LOW (ref 1.15–1.40)
HCT: 29 % — ABNORMAL LOW (ref 36.0–46.0)
HCT: 27 % — ABNORMAL LOW (ref 36.0–46.0)
HCT: 29 % — ABNORMAL LOW (ref 36.0–46.0)
Hemoglobin: 9.9 g/dL — ABNORMAL LOW (ref 12.0–15.0)
Hemoglobin: 9.2 g/dL — ABNORMAL LOW (ref 12.0–15.0)
Hemoglobin: 9.9 g/dL — ABNORMAL LOW (ref 12.0–15.0)
O2 Saturation: 99 %
O2 Saturation: 98 %
O2 Saturation: 98 %
Patient temperature: 37.3
Patient temperature: 37.7
Patient temperature: 37.5
Potassium: 4 mmol/L (ref 3.5–5.1)
Potassium: 3.7 mmol/L (ref 3.5–5.1)
Potassium: 3.9 mmol/L (ref 3.5–5.1)
Sodium: 146 mmol/L — ABNORMAL HIGH (ref 135–145)
Sodium: 146 mmol/L — ABNORMAL HIGH (ref 135–145)
Sodium: 145 mmol/L (ref 135–145)
TCO2: 23 mmol/L (ref 22–32)
TCO2: 24 mmol/L (ref 22–32)
TCO2: 22 mmol/L (ref 22–32)
pCO2 arterial: 43.6 mmHg (ref 32.0–48.0)
pCO2 arterial: 42.1 mmHg (ref 32.0–48.0)
pCO2 arterial: 43.9 mmHg (ref 32.0–48.0)
pH, Arterial: 7.306 — ABNORMAL LOW (ref 7.350–7.450)
pH, Arterial: 7.344 — ABNORMAL LOW (ref 7.350–7.450)
pH, Arterial: 7.287 — ABNORMAL LOW (ref 7.350–7.450)
pO2, Arterial: 152 mmHg — ABNORMAL HIGH (ref 83.0–108.0)
pO2, Arterial: 121 mmHg — ABNORMAL HIGH (ref 83.0–108.0)
pO2, Arterial: 126 mmHg — ABNORMAL HIGH (ref 83.0–108.0)

## 2020-03-25 LAB — COOXEMETRY PANEL
Carboxyhemoglobin: 0.8 % (ref 0.5–1.5)
Carboxyhemoglobin: 1 % (ref 0.5–1.5)
Methemoglobin: 0.8 % (ref 0.0–1.5)
Methemoglobin: 0.4 % (ref 0.0–1.5)
O2 Saturation: 55.6 %
O2 Saturation: 58.3 %
Total hemoglobin: 9.2 g/dL — ABNORMAL LOW (ref 12.0–16.0)
Total hemoglobin: 8.5 g/dL — ABNORMAL LOW (ref 12.0–16.0)

## 2020-03-25 LAB — MAGNESIUM
Magnesium: 2.6 mg/dL — ABNORMAL HIGH (ref 1.7–2.4)
Magnesium: 2.7 mg/dL — ABNORMAL HIGH (ref 1.7–2.4)

## 2020-03-25 LAB — BASIC METABOLIC PANEL
Anion gap: 10 (ref 5–15)
Anion gap: 6 (ref 5–15)
BUN: 11 mg/dL (ref 8–23)
BUN: 14 mg/dL (ref 8–23)
CO2: 22 mmol/L (ref 22–32)
CO2: 24 mmol/L (ref 22–32)
Calcium: 7.3 mg/dL — ABNORMAL LOW (ref 8.9–10.3)
Calcium: 7.2 mg/dL — ABNORMAL LOW (ref 8.9–10.3)
Chloride: 112 mmol/L — ABNORMAL HIGH (ref 98–111)
Chloride: 109 mmol/L (ref 98–111)
Creatinine, Ser: 0.82 mg/dL (ref 0.44–1.00)
Creatinine, Ser: 0.89 mg/dL (ref 0.44–1.00)
GFR calc Af Amer: >60 mL/min (ref >60)
GFR calc Af Amer: >60 mL/min (ref >60)
GFR calc non Af Amer: >60 mL/min (ref >60)
GFR calc non Af Amer: >60 mL/min (ref >60)
Glucose, Bld: 151 mg/dL — ABNORMAL HIGH (ref 70–99)
Glucose, Bld: 182 mg/dL — ABNORMAL HIGH (ref 70–99)
Potassium: 3.7 mmol/L (ref 3.5–5.1)
Potassium: 4.5 mmol/L (ref 3.5–5.1)
Sodium: 144 mmol/L (ref 135–145)
Sodium: 139 mmol/L (ref 135–145)

## 2020-03-25 LAB — BPAM FFP
Blood Product Expiration Date: 202107042359
Blood Product Expiration Date: 202107042359
ISSUE DATE / TIME: 202107011441
ISSUE DATE / TIME: 202107011441
Unit Type and Rh: 600
Unit Type and Rh: 6200

## 2020-03-25 LAB — CBC
HCT: 27.8 % — ABNORMAL LOW (ref 36.0–46.0)
HCT: 25 % — ABNORMAL LOW (ref 36.0–46.0)
Hemoglobin: 9.3 g/dL — ABNORMAL LOW (ref 12.0–15.0)
Hemoglobin: 8.1 g/dL — ABNORMAL LOW (ref 12.0–15.0)
MCH: 28.5 pg (ref 26.0–34.0)
MCH: 28.2 pg (ref 26.0–34.0)
MCHC: 33.5 g/dL (ref 30.0–36.0)
MCHC: 32.4 g/dL (ref 30.0–36.0)
MCV: 85.3 fL (ref 80.0–100.0)
MCV: 87.1 fL (ref 80.0–100.0)
Platelets: 158 10*3/uL (ref 150–400)
Platelets: 113 10*3/uL — ABNORMAL LOW (ref 150–400)
RBC: 3.26 MIL/uL — ABNORMAL LOW (ref 3.87–5.11)
RBC: 2.87 MIL/uL — ABNORMAL LOW (ref 3.87–5.11)
RDW: 16.9 % — ABNORMAL HIGH (ref 11.5–15.5)
RDW: 17.4 % — ABNORMAL HIGH (ref 11.5–15.5)
WBC: 9 10*3/uL (ref 4.0–10.5)
WBC: 8.2 10*3/uL (ref 4.0–10.5)
nRBC: 0 % (ref 0.0–0.2)
nRBC: 0 % (ref 0.0–0.2)

## 2020-03-25 LAB — PREPARE PLATELET PHERESIS
Unit division: 0
Unit division: 0

## 2020-03-25 LAB — BPAM PLATELET PHERESIS
Blood Product Expiration Date: 202107012359
Blood Product Expiration Date: 202107032359
ISSUE DATE / TIME: 202107011225
ISSUE DATE / TIME: 202107011442
Unit Type and Rh: 7300
Unit Type and Rh: 5100

## 2020-03-25 LAB — GLUCOSE, CAPILLARY
Glucose-Capillary: 103 mg/dL — ABNORMAL HIGH (ref 70–99)
Glucose-Capillary: 117 mg/dL — ABNORMAL HIGH (ref 70–99)
Glucose-Capillary: 124 mg/dL — ABNORMAL HIGH (ref 70–99)
Glucose-Capillary: 125 mg/dL — ABNORMAL HIGH (ref 70–99)
Glucose-Capillary: 126 mg/dL — ABNORMAL HIGH (ref 70–99)
Glucose-Capillary: 131 mg/dL — ABNORMAL HIGH (ref 70–99)
Glucose-Capillary: 135 mg/dL — ABNORMAL HIGH (ref 70–99)
Glucose-Capillary: 135 mg/dL — ABNORMAL HIGH (ref 70–99)
Glucose-Capillary: 146 mg/dL — ABNORMAL HIGH (ref 70–99)
Glucose-Capillary: 148 mg/dL — ABNORMAL HIGH (ref 70–99)
Glucose-Capillary: 149 mg/dL — ABNORMAL HIGH (ref 70–99)
Glucose-Capillary: 154 mg/dL — ABNORMAL HIGH (ref 70–99)
Glucose-Capillary: 159 mg/dL — ABNORMAL HIGH (ref 70–99)

## 2020-03-25 LAB — PREPARE FRESH FROZEN PLASMA
Unit division: 0
Unit division: 0

## 2020-03-25 LAB — SURGICAL PATHOLOGY

## 2020-03-25 MED ORDER — POTASSIUM CHLORIDE 10 MEQ/50ML IV SOLN
10.0000 meq | INTRAVENOUS | Status: AC
  Administered 2020-03-25 (×3): 10 meq via INTRAVENOUS
  Filled 2020-03-25 (×3): qty 50

## 2020-03-25 MED ORDER — INSULIN ASPART 100 UNIT/ML ~~LOC~~ SOLN
0.0000 [IU] | Freq: Three times a day (TID) | SUBCUTANEOUS | Status: DC
  Administered 2020-03-25 – 2020-03-26 (×2): 3 [IU] via SUBCUTANEOUS
  Administered 2020-03-26: 2 [IU] via SUBCUTANEOUS
  Administered 2020-03-26: 3 [IU] via SUBCUTANEOUS
  Administered 2020-03-27: 2 [IU] via SUBCUTANEOUS
  Administered 2020-03-27: 3 [IU] via SUBCUTANEOUS
  Administered 2020-03-27: 2 [IU] via SUBCUTANEOUS
  Administered 2020-03-28: 3 [IU] via SUBCUTANEOUS
  Administered 2020-03-28: 2 [IU] via SUBCUTANEOUS
  Administered 2020-03-28: 5 [IU] via SUBCUTANEOUS
  Administered 2020-03-29: 3 [IU] via SUBCUTANEOUS
  Administered 2020-03-29 – 2020-03-30 (×2): 2 [IU] via SUBCUTANEOUS

## 2020-03-25 MED ORDER — ORAL CARE MOUTH RINSE
15.0000 mL | Freq: Two times a day (BID) | OROMUCOSAL | Status: DC
  Administered 2020-03-25 – 2020-03-29 (×10): 15 mL via OROMUCOSAL

## 2020-03-25 NOTE — Telephone Encounter (Signed)
Patient has TOC appt with Jory Sims on 7/14, requested by [place].

## 2020-03-25 NOTE — Telephone Encounter (Signed)
Discharge planned for today 03/25/2020 TOC call should be 7/6

## 2020-03-25 NOTE — Discharge Instructions (Signed)
[facility] OFFICE [phone]   Endoscopic Saphenous Vein Harvesting, Care After This sheet gives you information about how to care for yourself after your procedure. Your health care provider may also give you more specific instructions. If you have problems or questions, contact your health care provider. What can I expect after the procedure? After the procedure, it is common to have:  Pain.  Bruising.  Swelling.  Numbness. Follow these instructions at home: Incision care   Follow instructions from your health care provider about how to take care of your incisions. Make sure you: ? Wash your hands with soap and water before and after you change your bandages (dressings). If soap and water are not available, use hand sanitizer. ? Change your dressings as told by your health care provider. ? Leave stitches (sutures), skin glue, or adhesive strips in place. These skin closures may need to stay in place for 2 weeks or longer. If adhesive strip edges start to loosen and curl up, you may trim the loose edges. Do not remove adhesive strips completely unless your health care provider tells you to do that.  Check your incision areas every day for signs of infection. Check for: ? More redness, swelling, or pain. ? Fluid or blood. ? Warmth. ? Pus or a bad smell. Medicines  Take over-the-counter and prescription medicines only as told by your health care provider.  Ask your health care provider if the medicine prescribed to you requires you to avoid driving or using heavy machinery. General instructions  Raise (elevate) your legs above the level of your heart while you are sitting or lying down.  Avoid crossing your legs.  Avoid sitting for long periods of time. Change positions every 30 minutes.  Do any exercises your health care providers have given you. These may include deep breathing, coughing, and walking exercises.  Do not take baths, swim, or use a hot tub until your health care  provider approves. Ask your health care provider if you may take showers. You may only be allowed to take sponge baths.  Wear compression stockings as told by your health care provider. These stockings help to prevent blood clots and reduce swelling in your legs.  Keep all follow-up visits as told by your health care provider. This is important. Contact a health care provider if:  Medicine does not help your pain.  Your pain gets worse.  You have new leg bruises or your leg bruises get bigger.  Your leg feels numb.  You have more redness, swelling, or pain around your incision.  You have fluid or blood coming from your incision.  Your incision feels warm to the touch.  You have pus or a bad smell coming from your incision.  You have a fever. Get help right away if:  Your pain is severe.  You develop pain, tenderness, warmth, redness, or swelling in any part of your leg.  You have chest pain.  You have trouble breathing. Summary  Raise (elevate) your legs above the level of your heart while you are sitting or lying down.  Wear compression stockings as told by your health care provider.  Make sure you know which symptoms should prompt you to contact your health care provider.  Keep all follow-up visits as told by your health care provider. This information is not intended to replace advice given to you by your health care provider. Make sure you discuss any questions you have with your health care provider. Document Revised: 08/18/2018 Document  Reviewed: 08/18/2018 Elsevier Patient Education  [employer]. Coronary Artery Bypass Grafting, Care After This sheet gives you information about how to care for yourself after your procedure. Your doctor may also give you more specific instructions. If you have problems or questions, call your doctor. What can I expect after the procedure? After the procedure, it is common to:  Feel sick to your stomach (nauseous).  Not  want to eat as much as normal (lack of appetite).  Have trouble pooping (constipation).  Have weakness and tiredness (fatigue).  Feel sad (depressed) or grouchy (irritable).  Have pain or discomfort around the cuts from surgery (incisions). Follow these instructions at home: Medicines  Take over-the-counter and prescription medicines only as told by your doctor. Do not stop taking medicines or start any new medicines unless your doctor says it is okay.  If you were prescribed an antibiotic medicine, take it as told by your doctor. Do not stop taking the antibiotic even if you start to feel better. Incision care   Follow instructions from your doctor about how to take care of your cuts from surgery. Make sure you: ? Wash your hands with soap and water before and after you change your bandage (dressing). If you cannot use soap and water, use hand sanitizer. ? Change your bandage as told by your doctor. ? Leave stitches (sutures), skin glue, or skin tape (adhesive) strips in place. They may need to stay in place for 2 weeks or longer. If tape strips get loose and curl up, you may trim the loose edges. Do not remove tape strips completely unless your doctor says it is okay.  Make sure the surgery cuts are clean, dry, and protected.  Check your cut areas every day for signs of infection. Check for: ? More redness, swelling, or pain. ? More fluid or blood. ? Warmth. ? Pus or a bad smell.  If cuts were made in your legs: ? Avoid crossing your legs. ? Avoid sitting for long periods of time. Change positions every 30 minutes. ? Raise (elevate) your legs when you are sitting. Bathing  Do not take baths, swim, or use a hot tub until your doctor says it is okay.  YOU MAY SHOWER. Pat the surgery cuts dry. Do not rub the cuts to dry. Eating and drinking   Eat foods that are high in fiber, such as beans, nuts, whole grains, and raw fruits and vegetables. Any meats you eat should be lean  cut. Avoid canned, processed, and fried foods. This can help prevent trouble pooping. This is also a part of a heart-healthy diet.  Drink enough fluid to keep your pee (urine) pale yellow.  Do not drink alcohol until you are fully recovered. Ask your doctor when it is safe to drink alcohol. Activity  Rest and limit your activity as told by your doctor. You may be told to: ? Stop any activity right away if you have chest pain, shortness of breath, irregular heartbeats, or dizziness. Get help right away if you have any of these symptoms. ? Move around often for short periods or take short walks as told by your doctor. Slowly increase your activities. ? Avoid lifting, pushing, or pulling anything that is heavier than 10 lb (4.5 kg) for at least 6 weeks or as told by your doctor.  Do physical therapy or a cardiac rehab (cardiac rehabilitation) program as told by your doctor. ? Physical therapy involves doing exercises to maintain movement and build strength and  endurance. ? A cardiac rehab program includes:  Exercise training.  Education.  Counseling.  Do not drive until your doctor says it is okay.  Ask your doctor when you can go back to work.  Ask your doctor when you can be sexually active. General instructions  Do not drive or use heavy machinery while taking prescription pain medicine.  Do not use any products that contain nicotine or tobacco. These include cigarettes, e-cigarettes, and chewing tobacco. If you need help quitting, ask your doctor.  Take 2-3 deep breaths every few hours during the day while you get better. This helps expand your lungs and prevent problems.  If you were given a device called an incentive spirometer, use it several times a day to practice deep breathing. Support your chest with a pillow or your arms when you take deep breaths or cough.  Wear compression stockings as told by your doctor.  Weigh yourself every day. This helps to see if your body is  holding (retaining) fluid that may make your heart and lungs work harder.  Keep all follow-up visits as told by your doctor. This is important. Contact a doctor if:  You have more redness, swelling, or pain around any cut.  You have more fluid or blood coming from any cut.  Any cut feels warm to the touch.  You have pus or a bad smell coming from any cut.  You have a fever.  You have swelling in your ankles or legs.  You have pain in your legs.  You gain 2 lb (0.9 kg) or more a day.  You feel sick to your stomach or you throw up (vomit).  You have watery poop (diarrhea). Get help right away if:  You have chest pain that goes to your jaw or arms.  You are short of breath.  You have a fast or irregular heartbeat.  You notice a "clicking" in your breastbone (sternum) when you move.  You have any signs of a stroke. "BE FAST" is an easy way to remember the main warning signs: ? B - Balance. Signs are dizziness, sudden trouble walking, or loss of balance. ? E - Eyes. Signs are trouble seeing or a change in how you see. ? F - Face. Signs are sudden weakness or loss of feeling of the face, or the face or eyelid drooping on one side. ? A - Arms. Signs are weakness or loss of feeling in an arm. This happens suddenly and usually on one side of the body. ? S - Speech. Signs are sudden trouble speaking, slurred speech, or trouble understanding what people say. ? T - Time. Time to call emergency services. Write down what time symptoms started.  You have other signs of a stroke, such as: ? A sudden, very bad headache with no known cause. ? Feeling sick to your stomach. ? Throwing up. ? Jerky movements you cannot control (seizure). These symptoms may be an emergency. Do not wait to see if the symptoms will go away. Get medical help right away. Call your local emergency services (911 in the [place] Do not drive yourself to the hospital. Summary  After the procedure, it is common to  have pain or discomfort in the cuts from surgery (incisions).  Do not take baths, swim, or use a hot tub until your doctor says it is okay.  Slowly increase your activities. You may need physical therapy or cardiac rehab.  Weigh yourself every day. This helps to see if your  body is holding fluid. This information is not intended to replace advice given to you by your health care provider. Make sure you discuss any questions you have with your health care provider. Document Revised: 05/20/2018 Document Reviewed: 05/20/2018 Elsevier Patient Education  2020 [employer].

## 2020-03-25 NOTE — Plan of Care (Signed)
  Problem: Education: Goal: Knowledge of General Education information will improve Description: Including pain rating scale, medication(s)/side effects and non-pharmacologic comfort measures Outcome: Progressing   Problem: Health Behavior/Discharge Planning: Goal: Ability to manage health-related needs will improve Outcome: Progressing   Problem: Clinical Measurements: Goal: Ability to maintain clinical measurements within normal limits will improve Outcome: Progressing Goal: Will remain free from infection Outcome: Progressing Goal: Diagnostic test results will improve Outcome: Progressing Goal: Respiratory complications will improve Outcome: Progressing Goal: Cardiovascular complication will be avoided Outcome: Progressing   Problem: Activity: Goal: Risk for activity intolerance will decrease Outcome: Progressing   Problem: Nutrition: Goal: Adequate nutrition will be maintained Outcome: Progressing   Problem: Coping: Goal: Level of anxiety will decrease Outcome: Progressing   Problem: Elimination: Goal: Will not experience complications related to bowel motility Outcome: Progressing Goal: Will not experience complications related to urinary retention Outcome: Progressing   Problem: Pain Managment: Goal: General experience of comfort will improve Outcome: Progressing   Problem: Safety: Goal: Ability to remain free from injury will improve Outcome: Progressing   Problem: Skin Integrity: Goal: Risk for impaired skin integrity will decrease Outcome: Progressing   Problem: Education: Goal: Understanding of CV disease, CV risk reduction, and recovery process will improve Outcome: Progressing Goal: Individualized Educational Video(s) Outcome: Progressing   Problem: Activity: Goal: Ability to return to baseline activity level will improve Outcome: Progressing   Problem: Cardiovascular: Goal: Ability to achieve and maintain adequate cardiovascular perfusion  will improve Outcome: Progressing Goal: Vascular access site(s) Level 0-1 will be maintained Outcome: Progressing   Problem: Health Behavior/Discharge Planning: Goal: Ability to safely manage health-related needs after discharge will improve Outcome: Progressing   Problem: Education: Goal: Will demonstrate proper wound care and an understanding of methods to prevent future damage Outcome: Progressing Goal: Knowledge of disease or condition will improve Outcome: Progressing Goal: Knowledge of the prescribed therapeutic regimen will improve Outcome: Progressing Goal: Individualized Educational Video(s) Outcome: Progressing   Problem: Activity: Goal: Risk for activity intolerance will decrease Outcome: Progressing   Problem: Cardiac: Goal: Will achieve and/or maintain hemodynamic stability Outcome: Progressing   Problem: Clinical Measurements: Goal: Postoperative complications will be avoided or minimized Outcome: Progressing   Problem: Respiratory: Goal: Respiratory status will improve Outcome: Progressing   Problem: Skin Integrity: Goal: Wound healing without signs and symptoms of infection Outcome: Progressing Goal: Risk for impaired skin integrity will decrease Outcome: Progressing   Problem: Urinary Elimination: Goal: Ability to achieve and maintain adequate renal perfusion and functioning will improve Outcome: Progressing   

## 2020-03-25 NOTE — Progress Notes (Signed)
CT surgery p.m. Rounds  Stable day ambulated in hallway Surgical pain improving  Blood pressure (!) 122/50, pulse 93, temperature 99 F (37.2 C), resp. rate 15, height 5' 4.5" (1.638 m), weight 70 kg, SpO2 98 %.

## 2020-03-25 NOTE — Discharge Summary (Signed)
Physician Discharge Summary  Patient ID: [name] MRN: [id number] DOB/AGE: Oct 04, 1940 79 y.o.  Admit date: 03/21/2020 Discharge date: 03/30/2020  Admission Diagnoses:NSTEMI  Discharge Diagnoses:  Principal Problem:   NSTEMI (non-ST elevated myocardial infarction) [facility]) Active Problems:   Essential hypertension   Dyslipidemia   DM2 (diabetes mellitus, type 2) (HCC)   Bifascicular block   S/P CABG x 5  Patient Active Problem List   Diagnosis Date Noted   S/P CABG x 5 03/24/2020   NSTEMI (non-ST elevated myocardial infarction) ([place]) 03/22/2020   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 This began in the B UEs and radiated to neck/jaw. At the time, she was severely hypertensive. She was taken to ED by EMS where NSTEMI dx'd. 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 Referred for CABG consideration. Has been CP free on heparin drip.    Discharged Condition: good  Hospital Course:   [name] remained stable following left heart catheterization.  She was prepared and taken to the operating room on 03/24/2020 where coronary bypass grafting x5 was accomplished.  The left internal mammary artery was grafted to the distal  left anterior descending coronary artery.  An endarterectomy to the left anterior descending coronary artery was also performed along with vein patch angioplasty.  Saphenous veins were grafted to the right posterior lateral coronary artery and distal obtuse marginal coronary artery.  Saphenous vein graft was placed to the diagonal coronary artery.  A pedicled right internal mammary artery graft was placed to the ramus intermediate coronary artery.  She tolerated the procedures well.  She separated from cardiopulmonary bypass without difficulty and was transferred to the cardiovascular ICU.  She remained hemodynamically stable.  She was weaned from the ventilator and extubated by 1 AM on the following day.  Vasoactive drips were gradually weaned.  She developed atrial fibrillation early postoperatively that was treated with IV amiodarone standard protocol.  This resulted in conversion back to sinus rhythm.  The amiodarone was converted to oral medication.  She was mobilized and made satisfactory progress.  Chest tubes were removed on postop day 3.  She was transferred to progressive care 4E on postop day 4.  She is continue to make excellent progress.  She has had some hypertension has been restarted on Micardis.  She has required some diuresis for volume overload which is steadily improving over time.  Renal function has remained normal.  Most recent BUN/creatinine date 03/29/2020 are 14 and 0.89 respectively.  She did have an expected acute blood loss anemia and has been transfused and this has stabilized.  Most recent hemoglobin hematocrit dated 03/29/2020 are 8.7 and 27.5 respectively.  Incisions are noted to be healing well without evidence of infection.  Oxygen has been weaned and she maintains good saturations on room air.  She is tolerating gradually increasing activities using standard cardiac rehab modalities.  Pacing wires were removed on 03/29/2020. Blood sugars have been under adequate control and she will be  resumed on home meds at discharge.  At the time of discharge patient is felt to be quite stable.    Consults: cardiology  Significant Diagnostic Studies: labs: routine post op as well as CXR's  Treatments: surgery:  CARDIOTHORACIC SURGERY OPERATIVE NOTE  Date of Procedure:    03/24/2020  Preoperative Diagnosis:      Severe 3-vessel Coronary Artery Disease, s/p NSTEMI  Postoperative Diagnosis:    Same  Procedure:        Coronary Artery Bypass Grafting x 5              Left Internal Mammary Artery to Distal Left Anterior Descending Coronary Artery; LAD endarterectomy with vein patch angioplasty; Saphenous Vein Graft to right Posterolateral Coronary Artery; Saphenous Vein Graft to distal Obtuse Marginal Branch of Left Circumflex Coronary Artery; Sapheonous Vein Graft to  Diagonal Branch Coronary Artery; pedicled RIMA to ramus intermedius coronary artery; Endoscopic Vein Harvest from right Thigh and Lower Leg Completion graft surveillance with indocyanine green fluorescence angiography Bilateral internal mammary artery harvesting Multilevel level rib block with Exparel solution  Surgeon:        B. Murvin Natal, MD  Assistant:       Joellyn Rued PA-C  Anesthesia:    get  Operative Findings: ? Preserved left ventricular systolic function ? Good quality  internal mammary artery conduits ? Good quality saphenous vein conduit ? Fair quality target vessels for grafting   Discharge Exam: Blood pressure (!) 168/63, pulse (!) 110, temperature 98.2 F (36.8 C), temperature source Oral, resp. rate 17, height 5' 4.5" (1.638 m), weight 73.6 kg, SpO2 98 %.  General appearance: alert, cooperative and no distress Heart: regular rate and rhythm and occas extrasystole, + a little tachy Lungs: clear to auscultation bilaterally Abdomen: benign Extremities: + LE edema Wound: incis healing well   Disposition: Discharge disposition: 01-Home or Self Care       Discharge Instructions     Amb Referral to Cardiac Rehabilitation   Complete by: As directed    Diagnosis:  CABG NSTEMI     CABG X ___: 5   After initial evaluation and assessments completed: Virtual Based Care may be provided alone or in conjunction with Phase 2 Cardiac Rehab based on patient barriers.: Yes   Discharge patient   Complete by: As directed    Discharge disposition: 01-Home or Self Care   Discharge patient date: 03/30/2020     Allergies as of 03/30/2020      Reactions   Bactrim [sulfamethoxazole-trimethoprim] Other (See Comments)   ^ K+( elevated)    Demerol [meperidine]    Delusional    Scopolamine    Delusional    Versed [midazolam] Anxiety   Frantic, out of my mind, agitated       Medication List    STOP taking these medications   diltiazem 240 MG 24 hr capsule Commonly known as: Cartia XT   vitamin B-12 1000 MCG tablet Commonly known as: CYANOCOBALAMIN   vitamin C 1000 MG tablet     TAKE these medications   acetaminophen 500 MG tablet Commonly known as: TYLENOL Take 1,000 mg by mouth every 6 (six) hours as needed for moderate pain or headache.   amiodarone 200 MG tablet Commonly known  as: PACERONE Take 1 tablet (200 mg total) by mouth daily.   aspirin EC 81 MG tablet Take 81 mg by mouth every evening.   atorvastatin 40 MG tablet Commonly known as: LIPITOR Take 1 tablet (40 mg total) by mouth daily. What changed:   medication strength  how much to take   calcium carbonate 500 MG chewable tablet Commonly known as: TUMS - dosed in mg elemental calcium Chew 2 tablets by mouth daily as needed for indigestion or heartburn.   CINNAMON PO Take 1,000 mg by mouth in the morning, at noon, and at bedtime.   clopidogrel 75 MG tablet Commonly known as: PLAVIX Take 1 tablet by mouth once daily   colchicine 0.6 MG tablet Take 0.5 tablets (0.3 mg total) by mouth 2 (two) times daily.   [employer] SENIOR PROBIOTIC PO Take 1 capsule by mouth daily with breakfast.   ferrous  [id number] C-folic acid capsule Commonly known as: TRINSICON / FOLTRIN Take 1 capsule by mouth 2 (two) times daily after a meal.   furosemide 40 MG tablet Commonly known as: LASIX Take 1 tablet (40 mg total) by mouth daily.   glipiZIDE 5 MG 24 hr tablet Commonly known as: GLUCOTROL XL Take 1 tablet by mouth once daily with breakfast What changed: See the new instructions.   hydrochlorothiazide 12.5 MG tablet Commonly known as: HYDRODIURIL Take 1 tablet (12.5 mg total) by mouth daily.   Invokana 100 MG Tabs tablet Generic drug: canagliflozin TAKE 1 TABLET BY MOUTH BEFORE BREAKFAST What changed: See the new instructions.   metFORMIN 1000 MG tablet Commonly known as: GLUCOPHAGE Take 1 tablet (1,000 mg total) by mouth 2 (two) times daily.   metoprolol tartrate 25 MG tablet Commonly known as: LOPRESSOR Take 1 tablet (25 mg total) by mouth 2 (two) times daily. What changed:   medication strength  how much to take  how to take this  when to take this  additional instructions   Ozempic (0.25 or 0.5 MG/DOSE) 2 MG/1.5ML Sopn Generic drug: Semaglutide(0.25 or 0.5MG /DOS) INJECT 0.5 MG INTO THE SKIN ONCE A WEEK What changed: See the new instructions.   pantoprazole 40 MG tablet Commonly known as: PROTONIX Take 1 tablet (40 mg total) by mouth daily. What changed: when to take this   potassium chloride SA 20 MEQ tablet Commonly known as: KLOR-CON Take 1 tablet (20 mEq total) by mouth daily.   SYSTANE OP Place 1 drop into both eyes 2 (two) times daily as needed (Dry eyes).   telmisartan 80 MG tablet Commonly known as: MICARDIS Take 1 tablet (80 mg total) by mouth daily. What changed: how much to take   traMADol 50 MG tablet Commonly known as: ULTRAM Take 1 tablet (50 mg total) by mouth every 6 (six) hours as needed for up to 7 days for moderate pain.       Follow-up Information    [name], MD Follow up.   Specialty: Cardiothoracic  Surgery Why: Please see discharge paperwork for follow-up appointment with Dr. Orvan Seen. Contact information: [address] [place] [address] [phone]        Lendon Colonel, NP Follow up on 04/06/2020.   Specialties: Nurse Practitioner, Radiology, Cardiology Why: Please arrive 15 mintues early for your 10:45am post-hospital cardiology follow-up appointment.  Contact information: [address] [place] [address] [phone]              The patient has been discharged on:  1.Beta Blocker:  Yes [ y  ]                              No   [   ]                              If No, reason:  2.Ace Inhibitor/ARB: Yes [ y  ]                                     No  [    ]                                     If No, reason:  3.Statin:   Yes Blue.Reese   ]                  No  [   ]                  If No, reason:  4.Ecasa:  Yes  [ y  ]                  No   [   ]                  If No, reason: Signed: John Giovanni PA-C 03/30/2020, 8:51 AM

## 2020-03-25 NOTE — Progress Notes (Signed)
[address]       [place] [address]             [phone]      1 Day Post-Op Procedure(s) (LRB): CORONARY ARTERY BYPASS GRAFTING (CABG) using LIMA to LAD (m); RIMA to RAMUS; Endoscopic Right Greater Saphenous Vein: SVG to Diag1; SVG to PLB (right); and SVG to PL (left). (N/A) TRANSESOPHAGEAL ECHOCARDIOGRAM (TEE) (N/A) INDOCYANINE GREEN FLUORESCENCE IMAGING (ICG) (N/A) ENDOVEIN HARVEST OF GREATER SAPHENOUS VEIN (Right) Subjective: Feels ok, OOB in chair Weaning gtts slowly but tolerating well, neo off milrinone and epi have been reduced  Objective: Vital signs in last 24 hours: Temp:  [96.6 F (35.9 C)-99.7 F (37.6 C)] 99 F (37.2 C) (07/02 1015) Pulse Rate:  [86-207] 93 (07/02 1415) Cardiac Rhythm: Normal sinus rhythm;Sinus tachycardia (07/02 1145) Resp:  [0-33] 16 (07/02 1415) BP: (84-159)/(35-97) 119/51 (07/02 1415) SpO2:  [92 %-100 %] 99 % (07/02 1415) Arterial Line BP: (93-173)/(32-58) 142/35 (07/02 1030) FiO2 (%):  [40 %-50 %] 40 % (07/02 0020) Weight:  [70 kg] 70 kg (07/02 0500)  Hemodynamic parameters for last 24 hours: PAP: (19-31)/(9-20) 28/12 CO:  [2.3 L/min-4.6 L/min] 4.6 L/min CI:  [1.4 L/min/m2-2.7 L/min/m2] 2.7 L/min/m2  Intake/Output from previous day: 07/01 0701 - 07/02 0700 In: 10124.5 [I.V.:6064.8; Blood:2577; IV Piggyback:1482.7] Out: 3536 [Urine:2605; Blood:1200; Chest Tube:2330] Intake/Output this shift: Total I/O In: 1095.1 [I.V.:943.2; IV Piggyback:151.9] Out: 425 [Urine:205; Chest Tube:220]  General appearance: alert, cooperative and no distress Heart: regular rate and rhythm Lungs: min dim in bases Abdomen: benign Extremities: minor edema Wound: dressings CDI  Lab Results: Recent Labs    03/24/20 2302 03/25/20 0043 03/25/20 0301 03/25/20 0309  WBC 9.0  --  9.0  --   HGB 9.9*   < > 9.3* 9.2*  HCT 29.7*   < > 27.8* 27.0*  PLT 143*  --  158  --    < > = values in this interval not displayed.   BMET:   Recent Labs    03/24/20 2012 03/25/20 0043 03/25/20 0301 03/25/20 0309  NA 142   < > 144 146*  K 3.7   < > 3.7 3.7  CL 111  --  112*  --   CO2 21*  --  22  --   GLUCOSE 172*  --  151*  --   BUN 13  --  11  --   CREATININE 0.74  --  0.82  --   CALCIUM 7.2*  --  7.3*  --    < > = values in this interval not displayed.    PT/INR:  Recent Labs    03/24/20 1422  LABPROT 20.3*  INR 1.8*   ABG    Component Value Date/Time   PHART 7.344 (L) 03/25/2020 0309   HCO3 22.7 03/25/2020 0309   TCO2 24 03/25/2020 0309   ACIDBASEDEF 3.0 (H) 03/25/2020 0309   O2SAT 55.6 03/25/2020 0600   CBG (last 3)  Recent Labs    03/25/20 1149 03/25/20 1302 03/25/20 1415  GLUCAP 131* 135* 159*    Meds Scheduled Meds: . acetaminophen  1,000 mg Oral Q6H   Or  . acetaminophen (TYLENOL) oral liquid 160 mg/5 mL  1,000 mg Per Tube Q6H  . aspirin EC  325 mg Oral Daily   Or  . aspirin  324 mg Per Tube Daily  . atorvastatin  40 mg Oral Daily  . bisacodyl  10 mg Oral Daily  Or  . bisacodyl  10 mg Rectal Daily  . calcium chloride  1 g Intravenous Once  . Chlorhexidine Gluconate Cloth  6 each Topical Daily  . docusate sodium  200 mg Oral Daily  . mouth rinse  15 mL Mouth Rinse BID  . metoprolol tartrate  12.5 mg Oral BID   Or  . metoprolol tartrate  12.5 mg Per Tube BID  . [START ON 03/26/2020] pantoprazole  40 mg Oral Daily  . sodium chloride flush  10-40 mL Intracatheter Q12H  . sodium chloride flush  3 mL Intravenous Q12H   Continuous Infusions: . sodium chloride Stopped (03/24/20 1445)  . sodium chloride    . sodium chloride 20 mL/hr at 03/24/20 1517  . cefUROXime (ZINACEF)  IV Stopped (03/25/20 0824)  . dexmedetomidine (PRECEDEX) IV infusion Stopped (03/25/20 0131)  . electrolyte-A 75 mL/hr at 03/25/20 1400  . epinephrine 0.5 mcg/min (03/25/20 1400)  . insulin 1.9 mL/hr at 03/25/20 1400  . lactated ringers    . lactated ringers 120 mL/hr at 03/24/20 1401  . lactated ringers 20  mL/hr at 03/25/20 1400  . milrinone 0.125 mcg/kg/min (03/25/20 1400)  . nitroGLYCERIN Stopped (03/24/20 1348)  . phenylephrine (NEO-SYNEPHRINE) Adult infusion Stopped (03/25/20 1140)   PRN Meds:.sodium chloride, dextrose, lactated ringers, metoprolol tartrate, morphine injection, ondansetron (ZOFRAN) IV, oxyCODONE, sodium chloride flush, sodium chloride flush, traMADol  Xrays DG Chest Port 1 View  Result Date: 03/25/2020 CLINICAL DATA:  Bypass surgery. EXAM: PORTABLE CHEST 1 VIEW COMPARISON:  Chest x-ray 03/24/2020 FINDINGS: The endotracheal tube and NG tubes have been removed. The Swan-Ganz catheter is stable. The tip is in the proximal right pulmonary artery. Stable bilateral chest tubes and mediastinal drain tubes. No pneumothorax. Persistent but improved streaky bibasilar atelectasis. No edema or effusions. IMPRESSION: 1. Interval extubation and removal of NG tube. 2. Remaining support apparatus is stable. 3. Persistent but improved bibasilar atelectasis. Electronically Signed   By: Marijo Sanes M.D.   On: 03/25/2020 07:20   DG Chest Port 1 View  Result Date: 03/24/2020 CLINICAL DATA:  Hypoxia. Status post coronary artery bypass grafting. EXAM: PORTABLE CHEST 1 VIEW COMPARISON:  March 21, 2020 FINDINGS: Endotracheal tube tip is 1.5 cm above the carina. Swan-Ganz catheter extends into the left main pulmonary artery where it loops upon itself with the tip in the proximal right main pulmonary artery. Nasogastric tube tip and side port are in the stomach. There is a chest tube on each side as well as a mediastinal drain. Temporary pacemaker wires are attached to the right heart. No pneumothorax. There is atelectatic change in the left base. The lungs otherwise are clear. Heart size and pulmonary vascularity are normal. Patient is status post coronary artery bypass grafting. No adenopathy. No bone lesions. IMPRESSION: Tube and catheter positions as described without pneumothorax. Atelectatic change left  base. Lungs otherwise clear. Cardiac silhouette normal. Postoperative changes noted. Electronically Signed   By: Lowella Grip III M.D.   On: 03/24/2020 14:42   ECHO INTRAOPERATIVE TEE  Result Date: 03/24/2020  *INTRAOPERATIVE TRANSESOPHAGEAL REPORT *  Patient Name:   NOTNAMED CROUCHER  Date of Exam: 03/24/2020 Medical Rec #:  [id number]      Height:       64.5 in Accession #:    [id number]     Weight:       141.9 lb Date of Birth:  Sep 20, 1941      BSA:          1.70 m Patient  Age:    79 years       BP:           173/67 mmHg Patient Gender: F              HR:           63 bpm. Exam Location:  Anesthesiology Transesophogeal exam was perform intraoperatively during surgical procedure. Patient was closely monitored under general anesthesia during the entirety of examination. Indications:     Coronary artery disease Sonographer:     Vickie Epley RDCS Performing Phys: [id number] [facility] ATKINS Diagnosing Phys: Lillia Abed MD Complications: No known complications during this procedure. POST-OP IMPRESSIONS Overall, there were no significant changes from pre-bypass. - Left Ventricle: The left ventricle is unchanged from pre-bypass. - Aorta: The aorta appears unchanged from pre-bypass. - Aortic Valve: The aortic valve appears unchanged from pre-bypass. - Tricuspid Valve: The tricuspid valve appears unchanged from pre-bypass. PRE-OP FINDINGS  Left Ventricle: The left ventricle has low normal systolic function, with an ejection fraction of 50-55%. The cavity size was mildly dilated. There is no increase in left ventricular wall thickness. Right Ventricle: The right ventricle has normal systolic function. The cavity was mildly enlarged. There is no increase in right ventricular wall thickness. Left Atrium: Left atrial size was normal in size. The left atrial appendage is well visualized and there is no evidence of thrombus present. Right Atrium: Right atrial size was normal in size. Interatrial Septum: No atrial level shunt  detected by color flow Doppler. Pericardium: There is no evidence of pericardial effusion. Mitral Valve: Tip of Ant leaflet has calcifies edge. No thickening of the mitral valve leaflet. Mild calcification of the mitral valve leaflet. Mitral valve regurgitation is mild by color flow Doppler. The MR jet is centrally-directed. There is No evidence of mitral stenosis. Tricuspid Valve: The tricuspid valve was normal in structure. Tricuspid valve regurgitation was not visualized by color flow Doppler. Aortic Valve: The aortic valve is tricuspid Aortic valve regurgitation was not visualized by color flow Doppler. There is no evidence of aortic valve stenosis. Pulmonic Valve: The pulmonic valve was not assessed. Pulmonic valve regurgitation is not visualized by color flow Doppler. Aorta: The aortic root, ascending aorta and aortic arch are normal in size and structure.  Lillia Abed MD Electronically signed by Lillia Abed MD Signature Date/Time: 03/24/2020/3:56:46 PM    Final     Assessment/Plan: S/P Procedure(s) (LRB): CORONARY ARTERY BYPASS GRAFTING (CABG) using LIMA to LAD (m); RIMA to RAMUS; Endoscopic Right Greater Saphenous Vein: SVG to Diag1; SVG to PLB (right); and SVG to PL (left). (N/A) TRANSESOPHAGEAL ECHOCARDIOGRAM (TEE) (N/A) INDOCYANINE GREEN FLUORESCENCE IMAGING (ICG) (N/A) ENDOVEIN HARVEST OF GREATER SAPHENOUS VEIN (Right)  1 overall doing well POD#1 2 hemodynamics are stable, weaning gtts 3 sinus with some PVC's 4 sats good on RA 5 expected ABL anemia, stabilizing, had a lot of chest tube drainage but is slowed considerably. Received PRBC's and products. Prob remove tubes tomorrow 6 normal renal fxn, making adeq urine spontaneously , but will need some diuretics. On plasmalyte  7 received K+ supplemental runs 8 push rehab and pulm toilet as able  LOS: 3 days    John Giovanni PA-C [phone] 03/25/2020

## 2020-03-25 NOTE — Procedures (Signed)
Extubation Procedure Note  Patient Details:   Name: [name] DOB: [birth year] MRN: [id number]   Airway Documentation:    Vent end date: (not recorded) Vent end time: (not recorded)   Evaluation  O2 sats: stable throughout Complications: No apparent complications Patient did tolerate procedure well. Bilateral Breath Sounds: Clear, Diminished   Yes   Pt tolerated SICU heart wean, VC 716mL, NIF -40, positive for cuff leak, extubated to 5L [place]. No dyspnea or stridor noted after extubation. RT will continue to monitor.   Mariam Dollar 03/25/2020, 1:15 AM

## 2020-03-26 ENCOUNTER — Inpatient Hospital Stay (HOSPITAL_COMMUNITY): Payer: Medicare Other

## 2020-03-26 LAB — CBC
HCT: 24.8 % — ABNORMAL LOW (ref 36.0–46.0)
Hemoglobin: 7.9 g/dL — ABNORMAL LOW (ref 12.0–15.0)
MCH: 28.8 pg (ref 26.0–34.0)
MCHC: 31.9 g/dL (ref 30.0–36.0)
MCV: 90.5 fL (ref 80.0–100.0)
Platelets: 103 10*3/uL — ABNORMAL LOW (ref 150–400)
RBC: 2.74 MIL/uL — ABNORMAL LOW (ref 3.87–5.11)
RDW: 18 % — ABNORMAL HIGH (ref 11.5–15.5)
WBC: 7.7 10*3/uL (ref 4.0–10.5)
nRBC: 0.3 % — ABNORMAL HIGH (ref 0.0–0.2)

## 2020-03-26 LAB — BASIC METABOLIC PANEL
Anion gap: 6 (ref 5–15)
BUN: 16 mg/dL (ref 8–23)
CO2: 25 mmol/L (ref 22–32)
Calcium: 7.7 mg/dL — ABNORMAL LOW (ref 8.9–10.3)
Chloride: 107 mmol/L (ref 98–111)
Creatinine, Ser: 0.84 mg/dL (ref 0.44–1.00)
GFR calc Af Amer: >60 mL/min (ref >60)
GFR calc non Af Amer: >60 mL/min (ref >60)
Glucose, Bld: 141 mg/dL — ABNORMAL HIGH (ref 70–99)
Potassium: 4.7 mmol/L (ref 3.5–5.1)
Sodium: 138 mmol/L (ref 135–145)

## 2020-03-26 LAB — COOXEMETRY PANEL
Carboxyhemoglobin: 1.4 % (ref 0.5–1.5)
Methemoglobin: 1 % (ref 0.0–1.5)
O2 Saturation: 48.6 %
Total hemoglobin: 13.8 g/dL (ref 12.0–16.0)

## 2020-03-26 LAB — GLUCOSE, CAPILLARY
Glucose-Capillary: 145 mg/dL — ABNORMAL HIGH (ref 70–99)
Glucose-Capillary: 186 mg/dL — ABNORMAL HIGH (ref 70–99)
Glucose-Capillary: 163 mg/dL — ABNORMAL HIGH (ref 70–99)
Glucose-Capillary: 163 mg/dL — ABNORMAL HIGH (ref 70–99)

## 2020-03-26 MED ORDER — COLCHICINE 0.3 MG HALF TABLET
0.3000 mg | ORAL_TABLET | Freq: Two times a day (BID) | ORAL | Status: DC
  Administered 2020-03-26 – 2020-03-30 (×9): 0.3 mg via ORAL
  Filled 2020-03-26 (×11): qty 1

## 2020-03-26 MED ORDER — AMIODARONE HCL IN DEXTROSE 360-4.14 MG/200ML-% IV SOLN
60.0000 mg/h | INTRAVENOUS | Status: AC
  Administered 2020-03-26: 60 mg/h via INTRAVENOUS
  Filled 2020-03-26: qty 200
  Filled 2020-03-26: qty 400

## 2020-03-26 MED ORDER — MAGNESIUM SULFATE 2 GM/50ML IV SOLN
2.0000 g | Freq: Once | INTRAVENOUS | Status: AC
  Administered 2020-03-26: 2 g via INTRAVENOUS
  Filled 2020-03-26: qty 50

## 2020-03-26 MED ORDER — FE FUMARATE-B12-VIT C-FA-IFC PO CAPS
1.0000 | ORAL_CAPSULE | Freq: Two times a day (BID) | ORAL | Status: DC
  Administered 2020-03-26 – 2020-03-28 (×5): 1 via ORAL
  Filled 2020-03-26 (×5): qty 1

## 2020-03-26 MED ORDER — CLOPIDOGREL BISULFATE 75 MG PO TABS
75.0000 mg | ORAL_TABLET | Freq: Every day | ORAL | Status: DC
  Administered 2020-03-26 – 2020-03-30 (×5): 75 mg via ORAL
  Filled 2020-03-26 (×5): qty 1

## 2020-03-26 MED ORDER — ASPIRIN EC 81 MG PO TBEC
81.0000 mg | DELAYED_RELEASE_TABLET | Freq: Every day | ORAL | Status: DC
  Administered 2020-03-27 – 2020-03-30 (×4): 81 mg via ORAL
  Filled 2020-03-26 (×4): qty 1

## 2020-03-26 MED ORDER — AMIODARONE LOAD VIA INFUSION
150.0000 mg | Freq: Once | INTRAVENOUS | Status: AC
  Administered 2020-03-26: 150 mg via INTRAVENOUS
  Filled 2020-03-26: qty 83.34

## 2020-03-26 MED ORDER — AMIODARONE HCL IN DEXTROSE 360-4.14 MG/200ML-% IV SOLN
30.0000 mg/h | INTRAVENOUS | Status: DC
  Administered 2020-03-26: 30 mg/h via INTRAVENOUS
  Filled 2020-03-26: qty 200

## 2020-03-26 MED ORDER — FUROSEMIDE 10 MG/ML IJ SOLN
40.0000 mg | Freq: Two times a day (BID) | INTRAMUSCULAR | Status: DC
  Administered 2020-03-26 – 2020-03-27 (×3): 40 mg via INTRAVENOUS
  Filled 2020-03-26 (×3): qty 4

## 2020-03-26 NOTE — Progress Notes (Signed)
CT surgery p.m. Rounds  Patient examined and record reviewed.Hemodynamics stable,labs satisfactory.Patient had stable day.Continue current care.  Blood pressure (!) 103/54, pulse 68, temperature 97.7 F (36.5 C), resp. rate 17, height 5' 4.5" (1.638 m), weight 76.1 kg, SpO2 96 %.  [name] 03/26/2020

## 2020-03-26 NOTE — Progress Notes (Signed)
2 Days Post-Op Procedure(s) (LRB): CORONARY ARTERY BYPASS GRAFTING (CABG) using LIMA to LAD (m); RIMA to RAMUS; Endoscopic Right Greater Saphenous Vein: SVG to Diag1; SVG to PLB (right); and SVG to PL (left). (N/A) TRANSESOPHAGEAL ECHOCARDIOGRAM (TEE) (N/A) INDOCYANINE GREEN FLUORESCENCE IMAGING (ICG) (N/A) ENDOVEIN HARVEST OF GREATER SAPHENOUS VEIN (Right) Subjective: Feeling puffy  Objective: Vital signs in last 24 hours: Temp:  [97.8 F (36.6 C)-98.3 F (36.8 C)] 98.3 F (36.8 C) (07/03 0751) Pulse Rate:  [81-125] 111 (07/03 1039) Cardiac Rhythm: Normal sinus rhythm (07/03 0800) Resp:  [0-38] 14 (07/03 1039) BP: (85-159)/(43-71) 141/71 (07/03 1039) SpO2:  [92 %-100 %] 97 % (07/03 1039) Weight:  [76.1 kg] 76.1 kg (07/03 0500)  Hemodynamic parameters for last 24 hours:    Intake/Output from previous day: 07/02 0701 - 07/03 0700 In: 2522.6 [I.V.:2370.7; IV Piggyback:151.9] Out: 1345 [Urine:735; Chest Tube:610] Intake/Output this shift: Total I/O In: 285.7 [I.V.:285.7] Out: 60 [Urine:60]  General appearance: alert and cooperative Neurologic: intact Heart: irregularly irregular rhythm Lungs: rales bilaterally Abdomen: soft, non-tender; bowel sounds normal; no masses,  no organomegaly Extremities: edema 2+ Wound: dressed, dry  Lab Results: Recent Labs    03/25/20 1622 03/26/20 0500  WBC 8.2 7.7  HGB 8.1* 7.9*  HCT 25.0* 24.8*  PLT 113* 103*   BMET:  Recent Labs    03/25/20 1625 03/26/20 0500  NA 139 138  K 4.5 4.7  CL 109 107  CO2 24 25  GLUCOSE 182* 141*  BUN 14 16  CREATININE 0.89 0.84  CALCIUM 7.2* 7.7*    PT/INR:  Recent Labs    03/24/20 1422  LABPROT 20.3*  INR 1.8*   ABG    Component Value Date/Time   PHART 7.344 (L) 03/25/2020 0309   HCO3 22.7 03/25/2020 0309   TCO2 24 03/25/2020 0309   ACIDBASEDEF 3.0 (H) 03/25/2020 0309   O2SAT 48.6 03/26/2020 0454   CBG (last 3)  Recent Labs    03/25/20 1546 03/25/20 2122 03/26/20 0658   GLUCAP 154* 148* 145*    Assessment/Plan: S/P Procedure(s) (LRB): CORONARY ARTERY BYPASS GRAFTING (CABG) using LIMA to LAD (m); RIMA to RAMUS; Endoscopic Right Greater Saphenous Vein: SVG to Diag1; SVG to PLB (right); and SVG to PL (left). (N/A) TRANSESOPHAGEAL ECHOCARDIOGRAM (TEE) (N/A) INDOCYANINE GREEN FLUORESCENCE IMAGING (ICG) (N/A) ENDOVEIN HARVEST OF GREATER SAPHENOUS VEIN (Right) Mobilize Diuresis amiodarone for new onset af   LOS: 4 days    [name] 03/26/2020

## 2020-03-27 ENCOUNTER — Inpatient Hospital Stay (HOSPITAL_COMMUNITY): Payer: Medicare Other

## 2020-03-27 LAB — BASIC METABOLIC PANEL
Anion gap: 7 (ref 5–15)
BUN: 20 mg/dL (ref 8–23)
CO2: 24 mmol/L (ref 22–32)
Calcium: 7.7 mg/dL — ABNORMAL LOW (ref 8.9–10.3)
Chloride: 104 mmol/L (ref 98–111)
Creatinine, Ser: 0.99 mg/dL (ref 0.44–1.00)
GFR calc Af Amer: >60 mL/min (ref >60)
GFR calc non Af Amer: 55 mL/min — ABNORMAL LOW (ref >60)
Glucose, Bld: 134 mg/dL — ABNORMAL HIGH (ref 70–99)
Potassium: 3.7 mmol/L (ref 3.5–5.1)
Sodium: 135 mmol/L (ref 135–145)

## 2020-03-27 LAB — CBC
HCT: 27 % — ABNORMAL LOW (ref 36.0–46.0)
Hemoglobin: 8.5 g/dL — ABNORMAL LOW (ref 12.0–15.0)
MCH: 28.5 pg (ref 26.0–34.0)
MCHC: 31.5 g/dL (ref 30.0–36.0)
MCV: 90.6 fL (ref 80.0–100.0)
Platelets: 136 10*3/uL — ABNORMAL LOW (ref 150–400)
RBC: 2.98 MIL/uL — ABNORMAL LOW (ref 3.87–5.11)
RDW: 17.5 % — ABNORMAL HIGH (ref 11.5–15.5)
WBC: 8.2 10*3/uL (ref 4.0–10.5)
nRBC: 0.4 % — ABNORMAL HIGH (ref 0.0–0.2)

## 2020-03-27 LAB — BPAM RBC
Blood Product Expiration Date: 202107242359
Blood Product Expiration Date: 202108012359
Blood Product Expiration Date: 202108042359
Blood Product Expiration Date: 202108042359
Blood Product Expiration Date: 202108042359
Blood Product Expiration Date: 202108042359
ISSUE DATE / TIME: 202107011000
ISSUE DATE / TIME: 202107011000
ISSUE DATE / TIME: 202107011028
ISSUE DATE / TIME: 202107011443
ISSUE DATE / TIME: 202107011443
ISSUE DATE / TIME: 202107011836
Unit Type and Rh: 5100
Unit Type and Rh: 5100
Unit Type and Rh: 5100
Unit Type and Rh: 5100
Unit Type and Rh: 5100
Unit Type and Rh: 5100

## 2020-03-27 LAB — GLUCOSE, CAPILLARY
Glucose-Capillary: 133 mg/dL — ABNORMAL HIGH (ref 70–99)
Glucose-Capillary: 140 mg/dL — ABNORMAL HIGH (ref 70–99)
Glucose-Capillary: 148 mg/dL — ABNORMAL HIGH (ref 70–99)
Glucose-Capillary: 153 mg/dL — ABNORMAL HIGH (ref 70–99)
Glucose-Capillary: 215 mg/dL — ABNORMAL HIGH (ref 70–99)

## 2020-03-27 LAB — TYPE AND SCREEN
ABO/RH(D): O POS
Antibody Screen: NEGATIVE
Unit division: 0
Unit division: 0
Unit division: 0
Unit division: 0
Unit division: 0
Unit division: 0

## 2020-03-27 MED ORDER — FUROSEMIDE 10 MG/ML IJ SOLN
40.0000 mg | Freq: Every day | INTRAMUSCULAR | Status: DC
  Administered 2020-03-28: 40 mg via INTRAVENOUS
  Filled 2020-03-27: qty 4

## 2020-03-27 MED ORDER — POTASSIUM CHLORIDE CRYS ER 20 MEQ PO TBCR
20.0000 meq | EXTENDED_RELEASE_TABLET | Freq: Every day | ORAL | Status: DC
  Administered 2020-03-27 – 2020-03-30 (×4): 20 meq via ORAL
  Filled 2020-03-27 (×4): qty 1

## 2020-03-27 MED ORDER — AMIODARONE HCL 200 MG PO TABS
200.0000 mg | ORAL_TABLET | Freq: Two times a day (BID) | ORAL | Status: DC
  Administered 2020-03-27 – 2020-03-30 (×7): 200 mg via ORAL
  Filled 2020-03-27 (×7): qty 1

## 2020-03-27 NOTE — Progress Notes (Signed)
3 Days Post-Op Procedure(s) (LRB): CORONARY ARTERY BYPASS GRAFTING (CABG) using LIMA to LAD (m); RIMA to RAMUS; Endoscopic Right Greater Saphenous Vein: SVG to Diag1; SVG to PLB (right); and SVG to PL (left). (N/A) TRANSESOPHAGEAL ECHOCARDIOGRAM (TEE) (N/A) INDOCYANINE GREEN FLUORESCENCE IMAGING (ICG) (N/A) ENDOVEIN HARVEST OF GREATER SAPHENOUS VEIN (Right) Subjective: Maintained sinus rhythm Chest x-ray clear Chest tubes with decreased output-we will remove Continue with diuresis for weight 10 pounds above baseline Transition from IV amiodarone to oral Continue to observe in ICU today  Objective: Vital signs in last 24 hours: Temp:  [97.5 F (36.4 C)-98.5 F (36.9 C)] 97.5 F (36.4 C) (07/04 0730) Pulse Rate:  [41-91] 76 (07/04 0917) Cardiac Rhythm: Normal sinus rhythm (07/04 0717) Resp:  [11-31] 12 (07/04 0917) BP: (91-147)/(45-73) 147/57 (07/04 0917) SpO2:  [95 %-100 %] 98 % (07/04 0917) Weight:  [74.4 kg] 74.4 kg (07/04 0612)  Hemodynamic parameters for last 24 hours:   Stable Intake/Output from previous day: 07/03 0701 - 07/04 0700 In: 1363.7 [I.V.:1313.7; IV Piggyback:50] Out: 1282 [Urine:1020; Chest Tube:262] Intake/Output this shift: Total I/O In: 16.7 [I.V.:16.7] Out: 70 [Urine:40; Chest Tube:30]       Exam    General- alert and comfortable    Neck- no JVD, no cervical adenopathy palpable, no carotid bruit   Lungs- clear without rales, wheezes   Cor- regular rate and rhythm, no murmur , gallop   Abdomen- soft, non-tender   Extremities - warm, non-tender, minimal edema   Neuro- oriented, appropriate, no focal weakness   Lab Results: Recent Labs    03/26/20 0500 03/27/20 0416  WBC 7.7 8.2  HGB 7.9* 8.5*  HCT 24.8* 27.0*  PLT 103* 136*   BMET:  Recent Labs    03/26/20 0500 03/27/20 0416  NA 138 135  K 4.7 3.7  CL 107 104  CO2 25 24  GLUCOSE 141* 134*  BUN 16 20  CREATININE 0.84 0.99  CALCIUM 7.7* 7.7*    PT/INR:  Recent Labs     03/24/20 1422  LABPROT 20.3*  INR 1.8*   ABG    Component Value Date/Time   PHART 7.344 (L) 03/25/2020 0309   HCO3 22.7 03/25/2020 0309   TCO2 24 03/25/2020 0309   ACIDBASEDEF 3.0 (H) 03/25/2020 0309   O2SAT 48.6 03/26/2020 0454   CBG (last 3)  Recent Labs    03/26/20 2006 03/27/20 0002 03/27/20 0646  GLUCAP 163* 140* 133*    Assessment/Plan: S/P Procedure(s) (LRB): CORONARY ARTERY BYPASS GRAFTING (CABG) using LIMA to LAD (m); RIMA to RAMUS; Endoscopic Right Greater Saphenous Vein: SVG to Diag1; SVG to PLB (right); and SVG to PL (left). (N/A) TRANSESOPHAGEAL ECHOCARDIOGRAM (TEE) (N/A) INDOCYANINE GREEN FLUORESCENCE IMAGING (ICG) (N/A) ENDOVEIN HARVEST OF GREATER SAPHENOUS VEIN (Right) Mobilize Diuresis d/c tubes/lines po amiodarone   LOS: 5 days    Tharon Aquas Trigt III 03/27/2020

## 2020-03-27 NOTE — Progress Notes (Signed)
Progress Note  Patient Name: [name] Date of Encounter: 03/27/2020  Primary Cardiologist:   [name], MD   Subjective   Doing well.  Denies pain.  Breathing OK.    Inpatient Medications    Scheduled Meds: . acetaminophen  1,000 mg Oral Q6H   Or  . acetaminophen (TYLENOL) oral liquid 160 mg/5 mL  1,000 mg Per Tube Q6H  . aspirin EC  81 mg Oral Daily  . atorvastatin  40 mg Oral Daily  . bisacodyl  10 mg Oral Daily   Or  . bisacodyl  10 mg Rectal Daily  . calcium chloride  1 g Intravenous Once  . Chlorhexidine Gluconate Cloth  6 each Topical Daily  . clopidogrel  75 mg Oral Daily  . colchicine  0.3 mg Oral BID  . docusate sodium  200 mg Oral Daily  . ferrous [id number] C-folic acid  1 capsule Oral BID PC  . furosemide  40 mg Intravenous BID  . insulin aspart  0-15 Units Subcutaneous TID WC  . mouth rinse  15 mL Mouth Rinse BID  . metoprolol tartrate  12.5 mg Oral BID   Or  . metoprolol tartrate  12.5 mg Per Tube BID  . pantoprazole  40 mg Oral Daily  . sodium chloride flush  10-40 mL Intracatheter Q12H  . sodium chloride flush  3 mL Intravenous Q12H   Continuous Infusions: . sodium chloride Stopped (03/24/20 1445)  . sodium chloride    . sodium chloride 20 mL/hr at 03/24/20 1517  . amiodarone 30 mg/hr (03/27/20 0800)  . lactated ringers 120 mL/hr at 03/24/20 1401   PRN Meds: sodium chloride, dextrose, metoprolol tartrate, ondansetron (ZOFRAN) IV, oxyCODONE, sodium chloride flush, sodium chloride flush, traMADol   Vital Signs    Vitals:   03/27/20 0612 03/27/20 0717 03/27/20 0730 03/27/20 0817  BP:  (!) 141/62  (!) 125/57  Pulse:  79  79  Resp:  14  17  Temp:   (!) 97.5 F (36.4 C)   TempSrc:      SpO2:  99%  97%  Weight: 74.4 kg     Height:        Intake/Output Summary (Last 24 hours) at 03/27/2020 0820 Last data filed at 03/27/2020 0800 Gross per 24 hour  Intake 1094.66 ml  Output 1292 ml  Net -197.34 ml   Filed Weights    03/25/20 0500 03/26/20 0500 03/27/20 0612  Weight: 70 kg 76.1 kg 74.4 kg    Telemetry    NSR - Personally Reviewed  ECG    NA - Personally Reviewed  Physical Exam   GEN: No acute distress.   Neck: No  JVD Cardiac: RRR, no murmurs, rubs, or gallops.  Respiratory:    Decreased breath sounds right greater than left diffuse crackles particularly at the bases GI: Soft, nontender, non-distended  MS:    Mild/mod leg edema; No deformity. Neuro:  Nonfocal  Psych: Normal affect   Labs    Chemistry Recent Labs  Lab 03/24/20 0230 03/24/20 0737 03/25/20 1625 03/26/20 0500 03/27/20 0416  NA 139   < > 139 138 135  K 3.7   < > 4.5 4.7 3.7  CL 107   < > 109 107 104  CO2 22   < > 24 25 24   GLUCOSE 125*   < > 182* 141* 134*  BUN 20   < > 14 16 20   CREATININE 0.88   < > 0.89 0.84 0.99  CALCIUM 9.2   < > 7.2* 7.7* 7.7*  PROT 5.7*  --   --   --   --   ALBUMIN 3.5  --   --   --   --   AST 18  --   --   --   --   ALT 14  --   --   --   --   ALKPHOS 45  --   --   --   --   BILITOT 0.6  --   --   --   --   GFRNONAA >60   < > >60 >60 55*  GFRAA >60   < > >60 >60 >60  ANIONGAP 10   < > 6 6 7    < > = values in this interval not displayed.     Hematology Recent Labs  Lab 03/25/20 1622 03/26/20 0500 03/27/20 0416  WBC 8.2 7.7 8.2  RBC 2.87* 2.74* 2.98*  HGB 8.1* 7.9* 8.5*  HCT 25.0* 24.8* 27.0*  MCV 87.1 90.5 90.6  MCH 28.2 28.8 28.5  MCHC 32.4 31.9 31.5  RDW 17.4* 18.0* 17.5*  PLT 113* 103* 136*    Cardiac EnzymesNo results for input(s): TROPONINI in the last 168 hours. No results for input(s): TROPIPOC in the last 168 hours.   BNPNo results for input(s): BNP, PROBNP in the last 168 hours.   DDimer No results for input(s): DDIMER in the last 168 hours.   Radiology    DG Chest Port 1 View  Result Date: 03/26/2020 CLINICAL DATA:  Open heart surgery. EXAM: PORTABLE CHEST 1 VIEW COMPARISON:  03/25/2020 FINDINGS: Sternotomy wires unchanged. Right IJ central venous sheath  has tip over the SVC. Bilateral chest tubes unchanged. Two stable mediastinal catheters. Lungs are adequately inflated with minimal linear atelectasis over the right midlung. No effusion or pneumothorax. Cardiomediastinal silhouette and remainder of the exam is unchanged. IMPRESSION: 1. Adequate lung volumes with minimal linear atelectasis over the right midlung. No effusion or pneumothorax. 2.  Tubes and lines as described. Electronically Signed   By: Marin Olp M.D.   On: 03/26/2020 10:26    Cardiac Studies    LEFT HEART CATH AND CORONARY ANGIOGRAPHY  Conclusion    Ost LM to Mid LM lesion is 45% stenosed.  Prox LAD to Mid LAD lesion is 90% stenosed.  Mid LAD lesion is 75% stenosed.  Dist LAD lesion is 70% stenosed.  Ost Cx to Prox Cx lesion is 95% stenosed.  Prox Cx to Mid Cx lesion is 99% stenosed.  2nd Mrg lesion is 85% stenosed.  1st Mrg lesion is 95% stenosed.  Ost RCA lesion is 50% stenosed.  Mid RCA to Dist RCA lesion is 90% stenosed.  RPDA lesion is 70% stenosed.  The left ventricular systolic function is normal.  LV end diastolic pressure is mildly elevated.  The left ventricular ejection fraction is 55-65% by visual estimate.  1. Severe 3 vessel obstructive CAD. 2. Normal LV function 3. 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 Recommend consideration for CABG. Will hold Plavix. Resume IV heparin post cath.   Diagnostic Dominance: Right     Patient Profile        79 y.o. female with a history of type 2 DM, HTN, HLD, hiatal hernia with GERD, and extensive PAD with multiple prior interventions, most recently left SFA stent and PTCA of left anterior tibial and popliteal arteries, as well as right TMA who presented  for evaluation of chest pain and found to have elevated troponin.  Now status post CABG.     Assessment & Plan    CAD/CABG:  Still on milrinone being dose reduced yesterday.  Doing relatively well  post op.     HLD:  Lipitor increased this admission.  Continue current therapy.   HTN: BP labile post op.  Tolerating low dose beta blocker.     DM:    A1C 6.0 on admission.  Continue current therapy.  Currently only on SSI.   ATRIAL FIB:  Started yesterday.  Now in in NSR on IV amiodarone.  Suspect this will be short term therapy that we can follow as an outpatient.    For questions or updates, please contact [place] Please consult [email or web address] for contact info under Cardiology/STEMI.   Signed, Minus Breeding, MD  03/27/2020, 8:20 AM

## 2020-03-27 NOTE — Progress Notes (Signed)
CT surgery p.m. Rounds  Patient maintaining sinus rhythm off amiodarone drip. Will remove central line tomorrow and Foley catheter  Walked 200 feet in the hallway today without difficulty   Plan transfer to progressive care tomorrow

## 2020-03-28 ENCOUNTER — Inpatient Hospital Stay (HOSPITAL_COMMUNITY): Payer: Medicare Other

## 2020-03-28 DIAGNOSIS — Z951 Presence of aortocoronary bypass graft: Secondary | ICD-10-CM

## 2020-03-28 LAB — CBC
HCT: 27.1 % — ABNORMAL LOW (ref 36.0–46.0)
Hemoglobin: 8.6 g/dL — ABNORMAL LOW (ref 12.0–15.0)
MCH: 28.8 pg (ref 26.0–34.0)
MCHC: 31.7 g/dL (ref 30.0–36.0)
MCV: 90.6 fL (ref 80.0–100.0)
Platelets: 182 10*3/uL (ref 150–400)
RBC: 2.99 MIL/uL — ABNORMAL LOW (ref 3.87–5.11)
RDW: 17 % — ABNORMAL HIGH (ref 11.5–15.5)
WBC: 8.8 10*3/uL (ref 4.0–10.5)
nRBC: 0.2 % (ref 0.0–0.2)

## 2020-03-28 LAB — BASIC METABOLIC PANEL
Anion gap: 5 (ref 5–15)
BUN: 18 mg/dL (ref 8–23)
CO2: 26 mmol/L (ref 22–32)
Calcium: 7.7 mg/dL — ABNORMAL LOW (ref 8.9–10.3)
Chloride: 105 mmol/L (ref 98–111)
Creatinine, Ser: 0.91 mg/dL (ref 0.44–1.00)
GFR calc Af Amer: >60 mL/min (ref >60)
GFR calc non Af Amer: >60 mL/min (ref >60)
Glucose, Bld: 138 mg/dL — ABNORMAL HIGH (ref 70–99)
Potassium: 3.8 mmol/L (ref 3.5–5.1)
Sodium: 136 mmol/L (ref 135–145)

## 2020-03-28 LAB — GLUCOSE, CAPILLARY
Glucose-Capillary: 147 mg/dL — ABNORMAL HIGH (ref 70–99)
Glucose-Capillary: 204 mg/dL — ABNORMAL HIGH (ref 70–99)
Glucose-Capillary: 168 mg/dL — ABNORMAL HIGH (ref 70–99)
Glucose-Capillary: 142 mg/dL — ABNORMAL HIGH (ref 70–99)

## 2020-03-28 MED ORDER — FUROSEMIDE 40 MG PO TABS
40.0000 mg | ORAL_TABLET | Freq: Every day | ORAL | Status: DC
  Administered 2020-03-29 – 2020-03-30 (×2): 40 mg via ORAL
  Filled 2020-03-28 (×2): qty 1

## 2020-03-28 MED ORDER — FUROSEMIDE 10 MG/ML IJ SOLN: 40.0000 mg | Freq: Once | INTRAMUSCULAR | Status: AC

## 2020-03-28 MED ORDER — FE FUMARATE-B12-VIT C-FA-IFC PO CAPS
1.0000 | ORAL_CAPSULE | Freq: Two times a day (BID) | ORAL | Status: DC
  Administered 2020-03-28 – 2020-03-30 (×4): 1 via ORAL
  Filled 2020-03-28 (×4): qty 1

## 2020-03-28 NOTE — Progress Notes (Signed)
Report called to 4E RN, Ander Purpura. SWOT RN called to come transport pt on tele up to [id number].

## 2020-03-28 NOTE — Progress Notes (Signed)
4 Days Post-Op Procedure(s) (LRB): CORONARY ARTERY BYPASS GRAFTING (CABG) using LIMA to LAD (m); RIMA to RAMUS; Endoscopic Right Greater Saphenous Vein: SVG to Diag1; SVG to PLB (right); and SVG to PL (left). (N/A) TRANSESOPHAGEAL ECHOCARDIOGRAM (TEE) (N/A) INDOCYANINE GREEN FLUORESCENCE IMAGING (ICG) (N/A) ENDOVEIN HARVEST OF GREATER SAPHENOUS VEIN (Right) Subjective: Feels well- walked in hall In nsr  Objective: Vital signs in last 24 hours: Temp:  [97.7 F (36.5 C)-98.8 F (37.1 C)] 98.4 F (36.9 C) (07/05 0747) Pulse Rate:  [74-94] 90 (07/05 0900) Cardiac Rhythm: Normal sinus rhythm (07/05 0800) Resp:  [11-27] 12 (07/05 0900) BP: (96-152)/(47-86) 150/60 (07/05 0900) SpO2:  [89 %-99 %] 98 % (07/05 0900) Weight:  [76.4 kg] 76.4 kg (07/05 0500)  Hemodynamic parameters for last 24 hours:    Intake/Output from previous day: 07/04 0701 - 07/05 0700 In: 61.3 [I.V.:61.3] Out: 1995 [Urine:1965; Chest Tube:30] Intake/Output this shift: Total I/O In: 240 [P.O.:240] Out: -        Exam    General- alert and comfortable    Neck- no JVD, no cervical adenopathy palpable, no carotid bruit   Lungs- clear without rales, wheezes   Cor- regular rate and rhythm, no murmur , gallop   Abdomen- soft, non-tender   Extremities - warm, non-tender, minimal edema   Neuro- oriented, appropriate, no focal weakness   Lab Results: Recent Labs    03/27/20 0416 03/28/20 0322  WBC 8.2 8.8  HGB 8.5* 8.6*  HCT 27.0* 27.1*  PLT 136* 182   BMET:  Recent Labs    03/27/20 0416 03/28/20 0322  NA 135 136  K 3.7 3.8  CL 104 105  CO2 24 26  GLUCOSE 134* 138*  BUN 20 18  CREATININE 0.99 0.91  CALCIUM 7.7* 7.7*    PT/INR: No results for input(s): LABPROT, INR in the last 72 hours. ABG    Component Value Date/Time   PHART 7.344 (L) 03/25/2020 0309   HCO3 22.7 03/25/2020 0309   TCO2 24 03/25/2020 0309   ACIDBASEDEF 3.0 (H) 03/25/2020 0309   O2SAT 48.6 03/26/2020 0454   CBG (last 3)   Recent Labs    03/27/20 1538 03/27/20 2127 03/28/20 0624  GLUCAP 148* 215* 147*    Assessment/Plan: S/P Procedure(s) (LRB): CORONARY ARTERY BYPASS GRAFTING (CABG) using LIMA to LAD (m); RIMA to RAMUS; Endoscopic Right Greater Saphenous Vein: SVG to Diag1; SVG to PLB (right); and SVG to PL (left). (N/A) TRANSESOPHAGEAL ECHOCARDIOGRAM (TEE) (N/A) INDOCYANINE GREEN FLUORESCENCE IMAGING (ICG) (N/A) ENDOVEIN HARVEST OF GREATER SAPHENOUS VEIN (Right) Mobilize Diuresis Diabetes control Plan for transfer to step-down: see transfer orders   LOS: 6 days    Tharon Aquas Trigt III 03/28/2020

## 2020-03-28 NOTE — Progress Notes (Addendum)
Progress Note  Patient Name: [name] Date of Encounter: 03/28/2020  [facility] Cardiologist: [name], MD   Subjective   Postop day #4 CABG x5.  Patient doing well, progressing according to plan.  Out of bed and ambulating.  Off of all drips.  Chest tubes are out.  Denies chest pain or shortness of breath.  Inpatient Medications    Scheduled Meds: . acetaminophen  1,000 mg Oral Q6H   Or  . acetaminophen (TYLENOL) oral liquid 160 mg/5 mL  1,000 mg Per Tube Q6H  . amiodarone  200 mg Oral BID  . aspirin EC  81 mg Oral Daily  . atorvastatin  40 mg Oral Daily  . bisacodyl  10 mg Oral Daily   Or  . bisacodyl  10 mg Rectal Daily  . Chlorhexidine Gluconate Cloth  6 each Topical Daily  . clopidogrel  75 mg Oral Daily  . colchicine  0.3 mg Oral BID  . docusate sodium  200 mg Oral Daily  . ferrous [id number] C-folic acid  1 capsule Oral BID PC  . furosemide  40 mg Intravenous Daily  . insulin aspart  0-15 Units Subcutaneous TID WC  . mouth rinse  15 mL Mouth Rinse BID  . metoprolol tartrate  12.5 mg Oral BID   Or  . metoprolol tartrate  12.5 mg Per Tube BID  . pantoprazole  40 mg Oral Daily  . potassium chloride  20 mEq Oral Daily  . sodium chloride flush  10-40 mL Intracatheter Q12H  . sodium chloride flush  3 mL Intravenous Q12H   Continuous Infusions: . sodium chloride Stopped (03/24/20 1445)  . sodium chloride    . sodium chloride 20 mL/hr at 03/24/20 1517   PRN Meds: sodium chloride, dextrose, metoprolol tartrate, ondansetron (ZOFRAN) IV, oxyCODONE, sodium chloride flush, sodium chloride flush, traMADol   Vital Signs    Vitals:   03/28/20 0505 03/28/20 0600 03/28/20 0605 03/28/20 0700  BP: (!) 120/53  (!) 143/86 (!) 136/59  Pulse: 82 86 89 91  Resp: 16 11  (!) 26  Temp:      TempSrc:      SpO2: 95% 97%  98%  Weight:      Height:        Intake/Output Summary (Last 24 hours) at 03/28/2020 0723 Last data filed at 03/28/2020 0100 Gross per  24 hour  Intake 61.29 ml  Output 1995 ml  Net -1933.71 ml   Last 3 Weights 03/28/2020 03/27/2020 03/26/2020  Weight (lbs) 168 lb 6.9 oz 164 lb 0.4 oz 167 lb 12.3 oz  Weight (kg) 76.4 kg 74.4 kg 76.1 kg      Telemetry    Sinus rhythm with occasional PVCs- Personally Reviewed  ECG    Not performed today- Personally Reviewed  Physical Exam   GEN: No acute distress.   Neck: No JVD Cardiac: RRR, no murmurs, rubs, or gallops.  Respiratory: Clear to auscultation bilaterally. GI: Soft, nontender, non-distended  MS: No edema; No deformity. Neuro:  Nonfocal  Psych: Normal affect   Labs    High Sensitivity Troponin:   Recent Labs  Lab 03/21/20 1821 03/21/20 2128 03/22/20 0328  TROPONINIHS 24* 553* 1,441*      Chemistry Recent Labs  Lab 03/24/20 0230 03/24/20 0737 03/26/20 0500 03/27/20 0416 03/28/20 0322  NA 139   < > 138 135 136  K 3.7   < > 4.7 3.7 3.8  CL 107   < > 107 104 105  CO2 22   < > 25 24 26   GLUCOSE 125*   < > 141* 134* 138*  BUN 20   < > 16 20 18   CREATININE 0.88   < > 0.84 0.99 0.91  CALCIUM 9.2   < > 7.7* 7.7* 7.7*  PROT 5.7*  --   --   --   --   ALBUMIN 3.5  --   --   --   --   AST 18  --   --   --   --   ALT 14  --   --   --   --   ALKPHOS 45  --   --   --   --   BILITOT 0.6  --   --   --   --   GFRNONAA >60   < > >60 55* >60  GFRAA >60   < > >60 >60 >60  ANIONGAP 10   < > 6 7 5    < > = values in this interval not displayed.     Hematology Recent Labs  Lab 03/26/20 0500 03/27/20 0416 03/28/20 0322  WBC 7.7 8.2 8.8  RBC 2.74* 2.98* 2.99*  HGB 7.9* 8.5* 8.6*  HCT 24.8* 27.0* 27.1*  MCV 90.5 90.6 90.6  MCH 28.8 28.5 28.8  MCHC 31.9 31.5 31.7  RDW 18.0* 17.5* 17.0*  PLT 103* 136* 182    BNPNo results for input(s): BNP, PROBNP in the last 168 hours.   DDimer No results for input(s): DDIMER in the last 168 hours.   Radiology    DG Chest Port 1 View  Result Date: 03/27/2020 CLINICAL DATA:  79 year old female with history of CABG.  EXAM: PORTABLE CHEST 1 VIEW COMPARISON:  Chest x-ray 03/26/2020. FINDINGS: Right IJ Cordis with tip terminating in the proximal superior vena cava. Bilateral chest tubes and mediastinal/pericardial drain, stable compared to prior examinations. Low lung volumes with bibasilar opacities favored to reflect areas of postoperative atelectasis, slightly worse in the left lung base. Trace bilateral pleural effusions. Small pneumothorax in the apex of the right hemithorax. No definite left pneumothorax. No evidence of pulmonary edema. Mild enlargement of the cardiopericardial silhouette, similar to the prior study. Status post median sternotomy for CABG. IMPRESSION: 1. Postoperative changes and support apparatus, as above. 2. Small right apical pneumothorax. 3. Low lung volumes with bibasilar postoperative subsegmental atelectasis, increasing in the left lower lobe compared to the prior study. 4. Trace bilateral pleural effusions. Electronically Signed   By: Vinnie Langton M.D.   On: 03/27/2020 08:24   DG Chest Port 1 View  Result Date: 03/26/2020 CLINICAL DATA:  Open heart surgery. EXAM: PORTABLE CHEST 1 VIEW COMPARISON:  03/25/2020 FINDINGS: Sternotomy wires unchanged. Right IJ central venous sheath has tip over the SVC. Bilateral chest tubes unchanged. Two stable mediastinal catheters. Lungs are adequately inflated with minimal linear atelectasis over the right midlung. No effusion or pneumothorax. Cardiomediastinal silhouette and remainder of the exam is unchanged. IMPRESSION: 1. Adequate lung volumes with minimal linear atelectasis over the right midlung. No effusion or pneumothorax. 2.  Tubes and lines as described. Electronically Signed   By: Marin Olp M.D.   On: 03/26/2020 10:26    Cardiac Studies   Cardiac catheterization (03/22/2020)  Conclusion    Ost LM to Mid LM lesion is 45% stenosed.  Prox LAD to Mid LAD lesion is 90% stenosed.  Mid LAD lesion is 75% stenosed.  Dist LAD lesion is 70%  stenosed.  Ost Cx  to Prox Cx lesion is 95% stenosed.  Prox Cx to Mid Cx lesion is 99% stenosed.  2nd Mrg lesion is 85% stenosed.  1st Mrg lesion is 95% stenosed.  Ost RCA lesion is 50% stenosed.  Mid RCA to Dist RCA lesion is 90% stenosed.  RPDA lesion is 70% stenosed.  The left ventricular systolic function is normal.  LV end diastolic pressure is mildly elevated.  The left ventricular ejection fraction is 55-65% by visual estimate.   1. Severe 3 vessel obstructive CAD. 2. Normal LV function 3. 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 Recommend consideration for CABG. Will hold Plavix. Resume IV heparin post cath.       Patient Profile     79 y.o.femalewith a history of type 2 DM, HTN, HLD, hiatal hernia with GERD, and extensive PAD with multiple prior interventions, most recently left SFA stent and PTCA of left anterior tibial and popliteal arteries, as well as right TMAwhopresented for evaluation of chest painand found to have elevated troponin. Now status post CABG.   Assessment & Plan    1: CAD/CABG-postop day 4 CABG x5.  Doing well.  Hemodynamically stable.  All chest tubes are out.  Normal progression per T CTS  2: Hyperlipidemia-history of hyperlipidemia on atorvastatin 40 mg a day with excellent lipid profile.  3: Essential hypertension-blood pressure well controlled on metoprolol  4: Peripheral arterial disease-status post multiple lower extremity interventions as well as right TMA, stable  5: Paroxysmal atrial fibrillation-PAF, postop.  Currently in sinus rhythm.  Her IV amiodarone has been transitioned to p.o.  I suspect this will be able to be discontinued within the first 3 months.  6: Volume overload-I/O+ 4.5 liters.  Weight up significantly as well.  Mild interstitial edema on chest x-ray.  Patient appropriately getting IV diuretics per T CTS.  I suspect she will go home on a low-dose oral diuretic.      For  questions or updates, please contact [place] Please consult [email or web address] for contact info under        Signed, [name], MD  03/28/2020, 7:23 AM

## 2020-03-28 NOTE — Progress Notes (Signed)
Mobility Specialist - Progress Note   03/28/20 1510  Mobility  Activity Ambulated in hall  Level of Assistance Modified independent, requires aide device or extra time  Assistive Device Front wheel walker  Distance Ambulated (ft) 300 ft  Mobility Response Tolerated well  Mobility performed by Mobility specialist  $Mobility charge 1 Mobility    Pre-mobility: 99 HR, 99% SpO2 During mobility: 115 HR, 100% SpO2 Post-mobility: 111 HR, 100% SpO2  Pt c/o RLE stiffness while ambulating.  Pricilla Handler Mobility Specialist Mobility Specialist Phone: [phone]

## 2020-03-28 NOTE — Progress Notes (Signed)
Pt received from [place] to 4e17. Oriented to room and call bell. CHG bath completed. Tele box connected to patient, CCMD called. VSS. Call bell in reach. Will continue to monitor.  Arletta Bale, RN

## 2020-03-29 LAB — BASIC METABOLIC PANEL
Anion gap: 7 (ref 5–15)
BUN: 14 mg/dL (ref 8–23)
CO2: 26 mmol/L (ref 22–32)
Calcium: 8.3 mg/dL — ABNORMAL LOW (ref 8.9–10.3)
Chloride: 106 mmol/L (ref 98–111)
Creatinine, Ser: 0.89 mg/dL (ref 0.44–1.00)
GFR calc Af Amer: >60 mL/min (ref >60)
GFR calc non Af Amer: >60 mL/min (ref >60)
Glucose, Bld: 145 mg/dL — ABNORMAL HIGH (ref 70–99)
Potassium: 4.8 mmol/L (ref 3.5–5.1)
Sodium: 139 mmol/L (ref 135–145)

## 2020-03-29 LAB — CBC
HCT: 27.5 % — ABNORMAL LOW (ref 36.0–46.0)
Hemoglobin: 8.7 g/dL — ABNORMAL LOW (ref 12.0–15.0)
MCH: 28.5 pg (ref 26.0–34.0)
MCHC: 31.6 g/dL (ref 30.0–36.0)
MCV: 90.2 fL (ref 80.0–100.0)
Platelets: 220 10*3/uL (ref 150–400)
RBC: 3.05 MIL/uL — ABNORMAL LOW (ref 3.87–5.11)
RDW: 16.8 % — ABNORMAL HIGH (ref 11.5–15.5)
WBC: 8.2 10*3/uL (ref 4.0–10.5)
nRBC: 0.6 % — ABNORMAL HIGH (ref 0.0–0.2)

## 2020-03-29 LAB — GLUCOSE, CAPILLARY
Glucose-Capillary: 134 mg/dL — ABNORMAL HIGH (ref 70–99)
Glucose-Capillary: 161 mg/dL — ABNORMAL HIGH (ref 70–99)
Glucose-Capillary: 118 mg/dL — ABNORMAL HIGH (ref 70–99)
Glucose-Capillary: 165 mg/dL — ABNORMAL HIGH (ref 70–99)

## 2020-03-29 MED ORDER — IRBESARTAN 150 MG PO TABS
75.0000 mg | ORAL_TABLET | Freq: Every day | ORAL | Status: DC
  Administered 2020-03-29 – 2020-03-30 (×2): 75 mg via ORAL
  Filled 2020-03-29 (×2): qty 1

## 2020-03-29 MED ORDER — GLIPIZIDE ER 5 MG PO TB24
5.0000 mg | ORAL_TABLET | Freq: Every day | ORAL | Status: DC
  Administered 2020-03-29 – 2020-03-30 (×2): 5 mg via ORAL
  Filled 2020-03-29 (×2): qty 1

## 2020-03-29 MED FILL — Albumin, Human Inj 5%: INTRAVENOUS | Qty: 250 | Status: AC

## 2020-03-29 MED FILL — Magnesium Sulfate Inj 50%: INTRAMUSCULAR | Qty: 10 | Status: AC

## 2020-03-29 MED FILL — Sodium Bicarbonate IV Soln 8.4%: INTRAVENOUS | Qty: 50 | Status: AC

## 2020-03-29 MED FILL — Heparin Sodium (Porcine) Inj 1000 Unit/ML: INTRAMUSCULAR | Qty: 20 | Status: AC

## 2020-03-29 MED FILL — Potassium Chloride Inj 2 mEq/ML: INTRAVENOUS | Qty: 40 | Status: AC

## 2020-03-29 MED FILL — Electrolyte-R (PH 7.4) Solution: INTRAVENOUS | Qty: 5000 | Status: AC

## 2020-03-29 MED FILL — Heparin Sodium (Porcine) Inj 1000 Unit/ML: INTRAMUSCULAR | Qty: 30 | Status: AC

## 2020-03-29 MED FILL — Sodium Chloride IV Soln 0.9%: INTRAVENOUS | Qty: 3000 | Status: AC

## 2020-03-29 MED FILL — Mannitol IV Soln 20%: INTRAVENOUS | Qty: 500 | Status: AC

## 2020-03-29 NOTE — Progress Notes (Signed)
Patient reports having multiple episodes of diarrhea today. Dulcolax not given on day shift due to diarrhea. States she is still able to eat lightly and only has occasional nausea with medications.

## 2020-03-29 NOTE — Care Management Important Message (Signed)
Important Message  Patient Details  Name: [name] MRN: [id number] Date of Birth: 01/11/1941   Medicare Important Message Given:  Yes     Shelda Altes 03/29/2020, 3:39 PM

## 2020-03-29 NOTE — Progress Notes (Signed)
.  CARDIAC REHAB PHASE I   Offered to walk with pt. Pt c/o nausea requesting med, RN made aware will f/u later to continue to encourage ambulation.  Rufina Falco, RN BSN 03/29/2020 10:47 AM

## 2020-03-29 NOTE — Progress Notes (Signed)
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 (N/A) TRANSESOPHAGEAL ECHOCARDIOGRAM (TEE) (N/A) INDOCYANINE GREEN FLUORESCENCE IMAGING (ICG) (N/A) ENDOVEIN HARVEST OF GREATER SAPHENOUS VEIN (Right) Subjective: Hypertensive, feels well overall  Objective: Vital signs in last 24 hours: Temp:  [97.7 F (36.5 C)-98.6 F (37 C)] 97.9 F (36.6 C) (07/06 0622) Pulse Rate:  [85-96] 96 (07/06 0624) Cardiac Rhythm: Normal sinus rhythm (07/06 0624) Resp:  [12-20] 16 (07/05 2300) BP: (121-162)/(50-76) 153/76 (07/06 0624) SpO2:  [92 %-100 %] 98 % (07/06 0624) Weight:  [74.8 kg] 74.8 kg (07/06 0500)  Hemodynamic parameters for last 24 hours:    Intake/Output from previous day: 07/05 0701 - 07/06 0700 In: 1100 [P.O.:1100] Out: 551 [Urine:550; Stool:1] Intake/Output this shift: No intake/output data recorded.  General appearance: alert, cooperative and no distress Heart: regular rate and rhythm and occas extrasystoles Lungs: mildly dim in bases Abdomen: benign exam Extremities: + LE edema Wound: incis healing well  Lab Results: Recent Labs    03/28/20 0322 03/29/20 0301  WBC 8.8 8.2  HGB 8.6* 8.7*  HCT 27.1* 27.5*  PLT 182 220   BMET:  Recent Labs    03/28/20 0322 03/29/20 0301  NA 136 139  K 3.8 4.8  CL 105 106  CO2 26 26  GLUCOSE 138* 145*  BUN 18 14  CREATININE 0.91 0.89  CALCIUM 7.7* 8.3*    PT/INR: No results for input(s): LABPROT, INR in the last 72 hours. ABG    Component Value Date/Time   PHART 7.344 (L) 03/25/2020 0309   HCO3 22.7 03/25/2020 0309   TCO2 24 03/25/2020 0309   ACIDBASEDEF 3.0 (H) 03/25/2020 0309   O2SAT 48.6 03/26/2020 0454   CBG (last 3)  Recent Labs    03/28/20 1640  03/28/20 2106 03/29/20 0622  GLUCAP 168* 142* 134*    Meds Scheduled Meds: . acetaminophen  1,000 mg Oral Q6H   Or  . acetaminophen (TYLENOL) oral liquid 160 mg/5 mL  1,000 mg Per Tube Q6H  . amiodarone  200 mg Oral BID  . aspirin EC  81 mg Oral Daily  . atorvastatin  40 mg Oral Daily  . bisacodyl  10 mg Oral Daily   Or  . bisacodyl  10 mg Rectal Daily  . Chlorhexidine Gluconate Cloth  6 each Topical Daily  . clopidogrel  75 mg Oral Daily  . colchicine  0.3 mg Oral BID  . docusate sodium  200 mg Oral Daily  . ferrous [id number] C-folic acid  1 capsule Oral BID PC  . furosemide  40 mg Oral Daily  . insulin aspart  0-15 Units Subcutaneous TID WC  . mouth rinse  15 mL Mouth Rinse BID  . metoprolol tartrate  12.5 mg Oral BID   Or  . metoprolol tartrate  12.5 mg Per Tube BID  . pantoprazole  40 mg Oral Daily  . potassium chloride  20 mEq Oral Daily  . sodium chloride flush  10-40 mL Intracatheter Q12H  . sodium chloride flush  3 mL Intravenous Q12H   Continuous Infusions: . sodium chloride Stopped (03/24/20 1445)  . sodium chloride    .  sodium chloride 20 mL/hr at 03/24/20 1517   PRN Meds:.sodium chloride, dextrose, metoprolol tartrate, ondansetron (ZOFRAN) IV, oxyCODONE, sodium chloride flush, sodium chloride flush, traMADol  Xrays DG Chest Port 1 View  Result Date: 03/28/2020 CLINICAL DATA:  CABG EXAM: PORTABLE CHEST 1 VIEW COMPARISON:  03/27/2020 FINDINGS: Interval removal of bilateral chest and mediastinal drainage tubes, with very minimal, less than 5% persistent biapical pneumothoraces. Unchanged small bilateral pleural effusions. Mild diffuse interstitial opacity. Cardiomegaly status post median sternotomy. Right neck vascular sheath remains in position. IMPRESSION: 1. Interval removal of bilateral chest and mediastinal drainage tubes, with very minimal, less than 5% persistent biapical pneumothoraces. 2. Unchanged small bilateral pleural effusions and edema.  Electronically Signed   By: Eddie Candle M.D.   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 (N/A) TRANSESOPHAGEAL ECHOCARDIOGRAM (TEE) (N/A) INDOCYANINE GREEN FLUORESCENCE IMAGING (ICG) (N/A) ENDOVEIN HARVEST OF GREATER SAPHENOUS VEIN (Right)  1 conts to make good progress 2 HTN- will resume home micardis dosing(avapro substituted here) 3 frequent PVC's- chronic. Most recent K+/Mg++ normal, normal EFx 4 some elevated sugars, will resume glucotrol. Resume invokana at d/c 5 some volume overload, continues lasix, normal renal fxn 6 d/c epw's today 7 cont rehab and pulm toilet 8 H/H stable- on Iron 9 poss home in am  LOS: 7 days    John Giovanni PA-C Pager [phone] 03/29/2020

## 2020-03-29 NOTE — Progress Notes (Signed)
Plan of care reviewed. Pt's been progressing. She's hemodynamically stable. NSR on monitor. BP within normal limits. SPO2 100% on room air, Auscultated lungs clear bilaterally. Remained afebrile. Pain tolerated well. Incision on sternal dry and clean. Pacer wires unused, rolled and protected with tape. No immediate distress noted tonight. Will continue to monitor.  Kennyth Lose, RN

## 2020-03-29 NOTE — Progress Notes (Signed)
EPW removed per order. VSS. Tips intact. Pt educated on 1 hour bedrest. Call light in reach.  Clyde Canterbury, RN

## 2020-03-29 NOTE — Telephone Encounter (Signed)
PATIENT IS [name]

## 2020-03-29 NOTE — Progress Notes (Signed)
CARDIAC REHAB PHASE I   Returned to walk with pt, pt seen ambulating with mobility tech. HR noted to be 120 ST. Will f/u as time allows.  Rufina Falco, RN BSN 03/29/2020 1:13 PM

## 2020-03-29 NOTE — Progress Notes (Signed)
Mobility Specialist - Progress Note   03/29/20 1321  Mobility  Activity Ambulated in hall  Level of Assistance Modified independent, requires aide device or extra time  Assistive Device Front wheel walker  Distance Ambulated (ft) 300 ft  Mobility Response Tolerated well  Mobility performed by Mobility specialist  $Mobility charge 1 Mobility    Pre-mobility: 103 HR, 98% SpO2 Post-mobility: 112 HR, 100% SpO2   [name] Transport planner Phone: [phone]

## 2020-03-29 NOTE — Progress Notes (Signed)
CARDIOLOGY RECOMMENDATIONS:  Discharge is anticipated in the next 48 hours. Recommendations for medications and follow up:  Discharge Medications: Continue medications as they are currently listed in the [facility]. Exceptions to the above:  I would reduce amiodarone to 200 mg daily at discharge  Follow Up: The patient's Primary Cardiologist is [name], MD  Follow up in the office in 2 week(s).  Signed,  Paiten Boies [place], MD  10:41 AM 03/29/2020  [facility]

## 2020-03-30 ENCOUNTER — Inpatient Hospital Stay (HOSPITAL_COMMUNITY): Payer: Medicare Other

## 2020-03-30 LAB — CBC
HCT: 29.2 % — ABNORMAL LOW (ref 36.0–46.0)
Hemoglobin: 9.3 g/dL — ABNORMAL LOW (ref 12.0–15.0)
MCH: 28.8 pg (ref 26.0–34.0)
MCHC: 31.8 g/dL (ref 30.0–36.0)
MCV: 90.4 fL (ref 80.0–100.0)
Platelets: 292 10*3/uL (ref 150–400)
RBC: 3.23 MIL/uL — ABNORMAL LOW (ref 3.87–5.11)
RDW: 16.7 % — ABNORMAL HIGH (ref 11.5–15.5)
WBC: 7.3 10*3/uL (ref 4.0–10.5)
nRBC: 0.3 % — ABNORMAL HIGH (ref 0.0–0.2)

## 2020-03-30 LAB — GLUCOSE, CAPILLARY: Glucose-Capillary: 136 mg/dL — ABNORMAL HIGH (ref 70–99)

## 2020-03-30 MED ORDER — AMIODARONE HCL 200 MG PO TABS: 200.0000 mg | ORAL_TABLET | Freq: Every day | ORAL | 1 refills | Status: DC

## 2020-03-30 MED ORDER — POTASSIUM CHLORIDE CRYS ER 20 MEQ PO TBCR: 20.0000 meq | EXTENDED_RELEASE_TABLET | Freq: Every day | ORAL | 0 refills | Status: DC

## 2020-03-30 MED ORDER — METOPROLOL TARTRATE 25 MG/10 ML ORAL SUSPENSION
12.5000 mg | Freq: Two times a day (BID) | ORAL | Status: DC
  Filled 2020-03-30: qty 5

## 2020-03-30 MED ORDER — METOPROLOL TARTRATE 25 MG PO TABS: 25.0000 mg | ORAL_TABLET | Freq: Two times a day (BID) | ORAL | 1 refills | Status: DC

## 2020-03-30 MED ORDER — FUROSEMIDE 40 MG PO TABS: 40.0000 mg | ORAL_TABLET | Freq: Every day | ORAL | 0 refills | Status: DC

## 2020-03-30 MED ORDER — METOPROLOL TARTRATE 25 MG PO TABS
25.0000 mg | ORAL_TABLET | Freq: Two times a day (BID) | ORAL | Status: DC
  Administered 2020-03-30: 25 mg via ORAL
  Filled 2020-03-30: qty 1

## 2020-03-30 MED ORDER — ATORVASTATIN CALCIUM 40 MG PO TABS: 40.0000 mg | ORAL_TABLET | Freq: Every day | ORAL | 1 refills | Status: DC

## 2020-03-30 MED ORDER — FE FUMARATE-B12-VIT C-FA-IFC PO CAPS: 1.0000 | ORAL_CAPSULE | Freq: Two times a day (BID) | ORAL | 2 refills | Status: DC

## 2020-03-30 MED ORDER — TRAMADOL HCL 50 MG PO TABS: 50.0000 mg | ORAL_TABLET | Freq: Four times a day (QID) | ORAL | 0 refills | Status: DC | PRN

## 2020-03-30 MED ORDER — COLCHICINE 0.6 MG PO TABS: 0.3000 mg | ORAL_TABLET | Freq: Two times a day (BID) | ORAL | 0 refills | Status: DC

## 2020-03-30 NOTE — Progress Notes (Signed)
BP 180/73 HR 105. Rechecked BP 174/63 HR 103. Administered scheduled 22:00 Metoprolol PO dose.

## 2020-03-30 NOTE — Progress Notes (Signed)
Discharge orders received.  Telemetry removed as well as IV.  Discharge information reviewed by 4E RN as well as cardiac rehab RN.  Pt has notified family and they will be here in an hour with her clothes.  Otherwise, pt ready for discharge to home.

## 2020-03-30 NOTE — Telephone Encounter (Signed)
Patient is still currently admitted. 

## 2020-03-30 NOTE — Progress Notes (Signed)
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 (N/A) TRANSESOPHAGEAL ECHOCARDIOGRAM (TEE) (N/A) INDOCYANINE GREEN FLUORESCENCE IMAGING (ICG) (N/A) ENDOVEIN HARVEST OF GREATER SAPHENOUS VEIN (Right) Subjective: Doing well, some tachy/htn  Objective: Vital signs in last 24 hours: Temp:  [97.7 F (36.5 C)-98.3 F (36.8 C)] 98.1 F (36.7 C) (07/07 0500) Pulse Rate:  [86-103] 99 (07/07 0500) Cardiac Rhythm: Normal sinus rhythm (07/06 2000) Resp:  [17-18] 17 (07/06 2119) BP: (133-182)/(57-81) 164/62 (07/07 0500) SpO2:  [96 %-99 %] 97 % (07/07 0500) Weight:  [73.6 kg] 73.6 kg (07/07 0500)  Hemodynamic parameters for last 24 hours:    Intake/Output from previous day: 07/06 0701 - 07/07 0700 In: 480 [P.O.:480] Out: -  Intake/Output this shift: No intake/output data recorded.  General appearance: alert, cooperative and no distress Heart: regular rate and rhythm and occas extrasystole, + a little tachy Lungs: clear to auscultation bilaterally Abdomen: benign Extremities: + LE edema Wound: incis healing well  Lab Results: Recent Labs    03/29/20 0301 03/30/20 0603  WBC 8.2 7.3  HGB 8.7* 9.3*  HCT 27.5* 29.2*  PLT 220 292   BMET:  Recent Labs    03/28/20 0322 03/29/20 0301  NA 136 139  K 3.8 4.8  CL 105 106  CO2 26 26  GLUCOSE 138* 145*  BUN 18 14  CREATININE 0.91 0.89  CALCIUM 7.7* 8.3*    PT/INR: No results for input(s): LABPROT, INR in the last 72 hours. ABG    Component Value Date/Time   PHART 7.344 (L) 03/25/2020 0309   HCO3 22.7 03/25/2020 0309   TCO2 24 03/25/2020 0309   ACIDBASEDEF 3.0 (H) 03/25/2020 0309   O2SAT 48.6 03/26/2020 0454   CBG (last 3)  Recent Labs    03/29/20 1715 03/29/20 2135  03/30/20 0712  GLUCAP 118* 165* 136*    Meds Scheduled Meds: . amiodarone  200 mg Oral BID  . aspirin EC  81 mg Oral Daily  . atorvastatin  40 mg Oral Daily  . bisacodyl  10 mg Oral Daily   Or  . bisacodyl  10 mg Rectal Daily  . Chlorhexidine Gluconate Cloth  6 each Topical Daily  . clopidogrel  75 mg Oral Daily  . colchicine  0.3 mg Oral BID  . docusate sodium  200 mg Oral Daily  . ferrous [id number] C-folic acid  1 capsule Oral BID PC  . furosemide  40 mg Oral Daily  . glipiZIDE  5 mg Oral Q breakfast  . insulin aspart  0-15 Units Subcutaneous TID WC  . irbesartan  75 mg Oral Daily  . mouth rinse  15 mL Mouth Rinse BID  . metoprolol tartrate  12.5 mg Oral BID   Or  . metoprolol tartrate  12.5 mg Per Tube BID  . pantoprazole  40 mg Oral Daily  . potassium chloride  20 mEq Oral Daily  . sodium chloride flush  10-40 mL Intracatheter Q12H  . sodium chloride flush  3 mL Intravenous Q12H   Continuous Infusions: . sodium chloride Stopped (03/24/20 1445)  . sodium chloride    . sodium chloride 20 mL/hr at 03/24/20 1517  PRN Meds:.sodium chloride, dextrose, metoprolol tartrate, ondansetron (ZOFRAN) IV, oxyCODONE, sodium chloride flush, sodium chloride flush, traMADol  Xrays No results found.  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 (N/A) TRANSESOPHAGEAL ECHOCARDIOGRAM (TEE) (N/A) INDOCYANINE GREEN FLUORESCENCE IMAGING (ICG) (N/A) ENDOVEIN HARVEST OF GREATER SAPHENOUS VEIN (Right)   1 doing well 2 will increase beta blocker dose with some tachy/htn, cont other meds- decrease amiodarone to 200 daily per Dr [place] 3 CXR - small right effus, ow looks good 4 H/H is improved, no leukocytosis, no thrombocytopenia, sugars adeq controlled 5 stable for d/c home   LOS: 8 days    John Giovanni  PA-C Pager [phone] 03/30/2020

## 2020-03-30 NOTE — Progress Notes (Signed)
BP at Midnight was 182/69. Rechecked at 00:19 - BP 150/68 HR 99. Pt reports no complaints at this time other than another episode of diarrhea.

## 2020-03-30 NOTE — Progress Notes (Signed)
CARDIAC REHAB PHASE I   D/c education completed with pt. Pt educated on importance of site care and monitoring incision daily. Encouraged continued IS use, walks, and sternal precautions. Pt given in-the-tube sheet along with heart healthy and diabetic diets. Reviewed restrictions and exercise guidelines. Will refer to CRP II [place]. Pt is interested in participating in Virtual Cardiac and Pulmonary Rehab. Pt advised that Virtual Cardiac and Pulmonary Rehab is provided at no cost to the patient.  Checklist:  1. Pt has smart device  ie smartphone and/or ipad for downloading an app  Yes 2. Reliable internet/wifi service    Yes 3. Understands how to use their smartphone and navigate within an app.  Yes  Pt verbalized understanding and is in agreement.  [phone] Rufina Falco, RN BSN 03/30/2020 8:49 AM

## 2020-03-31 NOTE — Telephone Encounter (Signed)
Called and spoke to patient and son-   Patient contacted regarding discharge from [facility] on 07/07.  Patient understands to follow up with provider Jory Sims, NP on 07/14 at 10:45AM at [facility]. Patient understands discharge instructions? Yes Patient understands medications and regiment? Yes Patient understands to bring all medications to this visit? Yes

## 2020-04-04 ENCOUNTER — Ambulatory Visit (INDEPENDENT_AMBULATORY_CARE_PROVIDER_SITE_OTHER): Payer: Self-pay | Admitting: Cardiothoracic Surgery

## 2020-04-04 ENCOUNTER — Other Ambulatory Visit: Payer: Self-pay

## 2020-04-04 ENCOUNTER — Other Ambulatory Visit: Payer: Self-pay | Admitting: Cardiothoracic Surgery

## 2020-04-04 ENCOUNTER — Ambulatory Visit
Admission: RE | Admit: 2020-04-04 | Discharge: 2020-04-04 | Disposition: A | Discharge status: Home / Self Care | Payer: Medicare Other | Source: Ambulatory Visit | Attending: Cardiothoracic Surgery | Admitting: Cardiothoracic Surgery

## 2020-04-04 VITALS — BP 145/73 | HR 117 | Temp 97.5°F | Resp 20 | Ht 64.5 in | Wt 147.5 lb

## 2020-04-04 DIAGNOSIS — I2510 Atherosclerotic heart disease of native coronary artery without angina pectoris: Secondary | ICD-10-CM

## 2020-04-04 DIAGNOSIS — J90 Pleural effusion, not elsewhere classified: Secondary | ICD-10-CM | POA: Diagnosis not present

## 2020-04-04 DIAGNOSIS — Z951 Presence of aortocoronary bypass graft: Secondary | ICD-10-CM | POA: Diagnosis not present

## 2020-04-04 NOTE — Progress Notes (Signed)
[address]       [place] [address]             [phone]     CARDIOTHORACIC SURGERY OFFICE NOTE  Referring Provider is [name], MD Primary Cardiologist is [name], MD PCP is [name], MD   HPI:  79 yo lady s/p CABG presents for initial outpatient postoperative f/u visit. No complaints of CP or SOB. Denies f/c Does note mild swelling at RLE SVG harvest site.    Current Outpatient Medications  Medication Sig Dispense Refill  . acetaminophen (TYLENOL) 500 MG tablet Take 1,000 mg by mouth every 6 (six) hours as needed for moderate pain or headache.    Marland Kitchen amiodarone (PACERONE) 200 MG tablet Take 1 tablet (200 mg total) by mouth daily. 30 tablet 1  . aspirin EC 81 MG tablet Take 81 mg by mouth every evening.    Marland Kitchen atorvastatin (LIPITOR) 40 MG tablet Take 1 tablet (40 mg total) by mouth daily. 30 tablet 1  . calcium carbonate (TUMS - DOSED IN MG ELEMENTAL CALCIUM) 500 MG chewable tablet Chew 2 tablets by mouth daily as needed for indigestion or heartburn.    Marland Kitchen CINNAMON PO Take 1,000 mg by mouth in the morning, at noon, and at bedtime.     . clopidogrel (PLAVIX) 75 MG tablet Take 1 tablet by mouth once daily (Patient taking differently: Take 75 mg by mouth daily. ) 90 tablet 2  . colchicine 0.6 MG tablet Take 0.5 tablets (0.3 mg total) by mouth 2 (two) times daily. 30 tablet 0  . ferrous [id number] C-folic acid (TRINSICON / FOLTRIN) capsule Take 1 capsule by mouth 2 (two) times daily after a meal. 60 capsule 2  . furosemide (LASIX) 40 MG tablet Take 1 tablet (40 mg total) by mouth daily. 7 tablet 0  . glipiZIDE (GLUCOTROL XL) 5 MG 24 hr tablet Take 1 tablet by mouth once daily with breakfast (Patient taking differently: Take 5 mg by mouth daily with breakfast. ) 90 tablet 0  . hydrochlorothiazide (HYDRODIURIL) 12.5 MG tablet Take 1 tablet (12.5 mg total) by mouth daily. 90 tablet 2  . INVOKANA 100 MG TABS tablet TAKE 1  TABLET BY MOUTH BEFORE BREAKFAST (Patient taking differently: Take 100 mg by mouth daily before breakfast. ) 30 tablet 1  . metFORMIN (GLUCOPHAGE) 1000 MG tablet Take 1 tablet (1,000 mg total) by mouth 2 (two) times daily. 180 tablet 1  . metoprolol tartrate (LOPRESSOR) 25 MG tablet Take 1 tablet (25 mg total) by mouth 2 (two) times daily. 60 tablet 1  . OZEMPIC, 0.25 OR 0.5 MG/DOSE, 2 MG/1.5ML SOPN INJECT 0.5 MG INTO THE SKIN ONCE A WEEK (Patient taking differently: Inject 0.5 mg into the skin every Monday. ) 2 mL 1  . pantoprazole (PROTONIX) 40 MG tablet Take 1 tablet (40 mg total) by mouth daily. (Patient taking differently: Take 40 mg by mouth every Monday, Wednesday, and Friday at 8 PM. ) 90 tablet 3  . Polyethyl Glycol-Propyl Glycol (SYSTANE OP) Place 1 drop into both eyes 2 (two) times daily as needed (Dry eyes).    . potassium chloride SA (KLOR-CON) 20 MEQ tablet Take 1 tablet (20 mEq total) by mouth daily. 7 tablet 0  . Probiotic Product ([employer] SENIOR PROBIOTIC PO) Take 1 capsule by mouth daily with breakfast.    . telmisartan (MICARDIS) 80 MG tablet Take 1 tablet (80 mg total) by mouth daily. (Patient taking  differently: Take 40 mg by mouth daily. ) 90 tablet 3  . traMADol (ULTRAM) 50 MG tablet Take 1 tablet (50 mg total) by mouth every 6 (six) hours as needed for up to 7 days for moderate pain. (Patient not taking: Reported on 04/04/2020) 28 tablet 0   No current facility-administered medications for this visit.      Physical Exam:   BP (!) 145/73   Pulse (!) 117 Comment: regular  Temp (!) 97.5 F (36.4 C) (Temporal)   Resp 20   Ht 5' 4.5" (1.638 m)   Wt 66.9 kg   SpO2 97% Comment: RA  BMI 24.93 kg/m   General:  Well-appearing, NAD  Chest:   cta  CV:   rrr  Incisions:  Healing well  Abdomen:  sntnd  Extremities:  Mild edema  Diagnostic Tests:  CXR with clear lung fields   Impression:  Doing well after CABG  Plan:  Suggest wearing soft bra for sternal support F/u  in 2 weeks for wound check and med review  I spent in excess of 20 minutes during the conduct of this office consultation and >50% of this time involved direct face-to-face encounter with the patient for counseling and/or coordination of their care.  Level 2                 10 minutes Level 3                 15 minutes Level 4                 25 minutes Level 5                 40 minutes  B. Murvin Natal, MD 04/04/2020 2:44 PM

## 2020-04-05 NOTE — Progress Notes (Signed)
Cardiology Office Note   Date:  04/05/2020   ID:  Joline, Encalada 19-Mar-1941, MRN [id number]  PCP:  [name], MD  Cardiologist:  Dr. Gwenlyn Found  CC: Hospital follow up   History of Present Illness: [name] is a 79 y.o. female who presents for ongoing assessment and management of coronary artery disease status post hospitalization after presentation with chest pain and non-STEMI.  Left heart cath was completed revealing severe three-vessel CAD with preserved LV function.  She was referred to CVTS for evaluation to have CABG.  Mrs. Salonga  has a history of hypertension, carotid artery disease, PAD, status post angioplasty with stent to the left FSA proximal segment in March 2021, PTCAs with drug-coated balloon left anterior tibial artery and left popliteal artery.  Dyslipidemia, diabetes type 2, and obesity.  She underwent CABG x5 on 03/24/2020 with LIMA to distal LAD with vein patch angioplasty, SVG to right posterior lateral coronary artery and distal obtuse marginal coronary artery, SVG to diagonal coronary artery, pedicle right internal mammary artery graft was placed to the ramus intermediate coronary artery.  She developed atrial fibrillation early postoperatively that was treated with IV amiodarone and she converted back to normal sinus rhythm.  She was placed on oral amiodarone.  She comes today with complaints of insomnia.  She states that she only sleeps 20 minutes or so at a time.  She is very tired during the day.  She denies recurrent chest pain or shortness of breath.  She is not very active currently but does walk around in her home.  She denies any bleeding or fever at home.  She is medically compliant.   Past Medical History:  Diagnosis Date  . Arthritis   . Cancer ([place])    removal from nose - MOSE procedure   . Complication of anesthesia    VERSED- agitated, muscle spasms, "jerking" , frantic , (never had this occurence in the pas)   . Diabetes mellitus  without complication ([place])   . GERD (gastroesophageal reflux disease)   . Heart murmur   . History of hiatal hernia   . Hyperlipidemia   . Hypertension 11/20/11   ECHO- EF>55% Borderline concentric left ventricular hypertrophy. There is a small calcified mass in the L:A near the LA appendage. No valvular masses seen with associated mitral annular calcification. LA Volume/ BSA27.4 ml/m2 No AS. Right ventricular systolic pressure is elevated at 69mmHg.  Marland Kitchen Peripheral vascular disease ([place])   . Pneumonia    not hosp.   . 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  PTCAs with drug coated balloon Lt ant tibial artery and Lt popliteal artery  11/26/2019    Past Surgical History:  Procedure Laterality Date  . ABDOMINAL AORTAGRAM  11/25/2019   ABDOMINAL AORTOGRAM W/LOWER EXTREMITY   . ABDOMINAL AORTOGRAM N/A 06/15/2019   Procedure: ABDOMINAL AORTOGRAM;  Surgeon: [name], MD;  Location: [facility];  Service: Cardiovascular;  Laterality: N/A;  . ABDOMINAL AORTOGRAM W/LOWER EXTREMITY N/A 11/25/2019   Procedure: ABDOMINAL AORTOGRAM W/LOWER EXTREMITY;  Surgeon: [name], MD;  Location: [place] [facility];  Service: Cardiovascular;  Laterality: N/A;  Lt leg  . APPENDECTOMY    . APPLICATION OF WOUND VAC Right 07/21/2019   Procedure: Application Of [place] Wound Vac Right Groin;  Surgeon: [name], MD;  Location: [place];  Service: Vascular;  Laterality: Right;  . CARPAL TUNNEL RELEASE    . CARPAL TUNNEL RELEASE    .  CESAREAN SECTION     x 2  . COLONOSCOPY    . CORONARY ARTERY BYPASS GRAFT N/A 03/24/2020   Procedure: CORONARY ARTERY BYPASS GRAFTING (CABG) using LIMA to LAD (m); RIMA to RAMUS; Endoscopic Right Greater Saphenous Vein: SVG to Diag1; SVG to PLB (right); and SVG to PL (left).;  Surgeon: [name], MD;  Location: [place];  Service: Open Heart Surgery;  Laterality: N/A;  BILATERAL IMA  . ENDARTERECTOMY FEMORAL Right 07/21/2019   Procedure: RIGHT  ENDARTERECTOMY FEMORAL WITH PATCH ANGIOPLASTY;  Surgeon: [name], MD;  [facility];  Service: Vascular;  Laterality: Right;  . ENDOVEIN HARVEST OF GREATER SAPHENOUS VEIN Right 03/24/2020   Procedure: ENDOVEIN HARVEST OF [place];  Surgeon: [name], MD;  Location: [place];  Service: Open Heart Surgery;  Laterality: Right;  . INTRAVASCULAR LITHOTRIPSY  11/25/2019   Procedure: INTRAVASCULAR LITHOTRIPSY;  Surgeon: [name], MD;  Location: [place] [facility];  Service: Cardiovascular;;  LT. SFA  . LEFT HEART CATH AND CORONARY ANGIOGRAPHY N/A 03/22/2020   Procedure: LEFT HEART CATH AND CORONARY ANGIOGRAPHY;  Surgeon: Martinique, Peter M, MD;  Location: [place] [facility];  Service: Cardiovascular;  Laterality: N/A;  . LOWER EXTREMITY ANGIOGRAPHY Bilateral 06/15/2019   Procedure: Lower Extremity Angiography;  Surgeon: [name], MD;  Location: [place] [facility];  Service: Cardiovascular;  Laterality: Bilateral;  . LOWER EXTREMITY ANGIOGRAPHY Right 08/03/2019   Procedure: LOWER EXTREMITY ANGIOGRAPHY;  Surgeon: [name], MD;  Location: [place] [facility];  Service: Cardiovascular;  Laterality: Right;  . PATCH ANGIOPLASTY Right 07/21/2019   Procedure: Patch Angioplasty Right Femoral Artery;  Surgeon: [name], MD;  Location: [facility];  Service: Vascular;  Laterality: Right;  . PERIPHERAL VASCULAR ATHERECTOMY  08/03/2019   Procedure: PERIPHERAL VASCULAR ATHERECTOMY;  Surgeon: [name], MD;  Location: [place] [facility];  Service: Cardiovascular;;  right SFA, right TP trunk  . PERIPHERAL VASCULAR ATHERECTOMY  11/25/2019   Procedure: PERIPHERAL VASCULAR ATHERECTOMY;  Surgeon: [name], MD;  Location: [place] [facility];  Service: Cardiovascular;;  Lt.  POPLITEAL and AT  . PERIPHERAL VASCULAR BALLOON ANGIOPLASTY Left 06/15/2019   Procedure: PERIPHERAL VASCULAR BALLOON ANGIOPLASTY;  Surgeon: [name], MD;  Location: [place] [facility];   Service: Cardiovascular;  Laterality: Left;  SFA UNSUCCESSFUL UNABLE TO CROSS LESION  . PERIPHERAL VASCULAR BALLOON ANGIOPLASTY  08/03/2019   Procedure: PERIPHERAL VASCULAR BALLOON ANGIOPLASTY;  Surgeon: [name], MD;  Location: [place] [facility];  Service: Cardiovascular;;  right SFA, Right TP trunk  . TEE WITHOUT CARDIOVERSION N/A 03/24/2020   Procedure: TRANSESOPHAGEAL ECHOCARDIOGRAM (TEE);  Surgeon: [name], MD;  Location: [facility];  Service: Open Heart Surgery;  Laterality: N/A;  . TONSILLECTOMY     and adenoidectomy  . 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/2019   Procedure: TRANSMETATARSAL AMPUTATION;  Surgeon: Trula Slade, DPM;  Location: [place];  Service: Podiatry;  Laterality: Right;  . TUBAL LIGATION       Current Outpatient Medications  Medication Sig Dispense Refill  . acetaminophen (TYLENOL) 500 MG tablet Take 1,000 mg by mouth every 6 (six) hours as needed for moderate pain or headache.    Marland Kitchen amiodarone (PACERONE) 200 MG tablet Take 1 tablet (200 mg total) by mouth daily. 30 tablet 1  . aspirin EC 81 MG tablet Take 81 mg by mouth every evening.    Marland Kitchen atorvastatin (LIPITOR) 40 MG tablet Take  1 tablet (40 mg total) by mouth daily. 30 tablet 1  . calcium carbonate (TUMS - DOSED IN MG ELEMENTAL CALCIUM) 500 MG chewable tablet Chew 2 tablets by mouth daily as needed for indigestion or heartburn.    Marland Kitchen CINNAMON PO Take 1,000 mg by mouth in the morning, at noon, and at bedtime.     . clopidogrel (PLAVIX) 75 MG tablet Take 1 tablet by mouth once daily (Patient taking differently: Take 75 mg by mouth daily. ) 90 tablet 2  . colchicine 0.6 MG tablet Take 0.5 tablets (0.3 mg total) by mouth 2 (two) times daily. 30 tablet 0  . ferrous [id number] C-folic acid (TRINSICON / FOLTRIN) capsule Take 1 capsule by mouth 2 (two) times daily after a meal. 60 capsule 2  . furosemide (LASIX) 40 MG tablet Take 1 tablet (40 mg total) by mouth daily. 7 tablet 0  . glipiZIDE  (GLUCOTROL XL) 5 MG 24 hr tablet Take 1 tablet by mouth once daily with breakfast (Patient taking differently: Take 5 mg by mouth daily with breakfast. ) 90 tablet 0  . hydrochlorothiazide (HYDRODIURIL) 12.5 MG tablet Take 1 tablet (12.5 mg total) by mouth daily. 90 tablet 2  . INVOKANA 100 MG TABS tablet TAKE 1 TABLET BY MOUTH BEFORE BREAKFAST (Patient taking differently: Take 100 mg by mouth daily before breakfast. ) 30 tablet 1  . metFORMIN (GLUCOPHAGE) 1000 MG tablet Take 1 tablet (1,000 mg total) by mouth 2 (two) times daily. 180 tablet 1  . metoprolol tartrate (LOPRESSOR) 25 MG tablet Take 1 tablet (25 mg total) by mouth 2 (two) times daily. 60 tablet 1  . OZEMPIC, 0.25 OR 0.5 MG/DOSE, 2 MG/1.5ML SOPN INJECT 0.5 MG INTO THE SKIN ONCE A WEEK (Patient taking differently: Inject 0.5 mg into the skin every Monday. ) 2 mL 1  . pantoprazole (PROTONIX) 40 MG tablet Take 1 tablet (40 mg total) by mouth daily. (Patient taking differently: Take 40 mg by mouth every Monday, Wednesday, and Friday at 8 PM. ) 90 tablet 3  . Polyethyl Glycol-Propyl Glycol (SYSTANE OP) Place 1 drop into both eyes 2 (two) times daily as needed (Dry eyes).    . potassium chloride SA (KLOR-CON) 20 MEQ tablet Take 1 tablet (20 mEq total) by mouth daily. 7 tablet 0  . Probiotic Product ([employer] SENIOR PROBIOTIC PO) Take 1 capsule by mouth daily with breakfast.    . telmisartan (MICARDIS) 80 MG tablet Take 1 tablet (80 mg total) by mouth daily. (Patient taking differently: Take 40 mg by mouth daily. ) 90 tablet 3  . traMADol (ULTRAM) 50 MG tablet Take 1 tablet (50 mg total) by mouth every 6 (six) hours as needed for up to 7 days for moderate pain. (Patient not taking: Reported on 04/04/2020) 28 tablet 0   No current facility-administered medications for this visit.    Allergies:   Bactrim [sulfamethoxazole-trimethoprim], Demerol [meperidine], Scopolamine, and Versed [midazolam]    Social History:  The patient  reports that she quit  smoking about 48 years ago. She has never used smokeless tobacco. She reports that she does not drink alcohol and does not use drugs.   Family History:  The patient's family history includes Cancer - Colon in her father; Cancer - Prostate in her father; Hyperlipidemia in her mother; Hypertension in her mother; Melanoma in her brother; Stroke in her mother.    ROS: All other systems are reviewed and negative. Unless otherwise mentioned in H&P    PHYSICAL EXAM: VS:  There were no vitals taken for this visit. , BMI There is no height or weight on file to calculate BMI. GEN: Well nourished, well developed, in no acute distress HEENT: normal Neck: no JVD, right carotid bruits, or masses Cardiac: RRR, tachycardic; no murmurs, rubs, or gallops,no edema  Respiratory: Upper lobe crackles without wheezing, not cleared with cough, clear in the bases.   GI: soft, nontender, nondistended, + BS MS: no deformity or atrophy.  Well-healed sternotomy incision site, right leg vein graft harvest site healing well with significant ecchymosis in the medial lateral thigh.  No pain Skin: warm and dry, no rash Neuro:  Strength and sensation are intact Psych: euthymic mood, full affect   EKG: Sinus tachycardia heart rate of 118 bpm with left axis deviation right bundle branch block.  Personally reviewed.  Compared to previous EKG on 03/25/2020, left anterior fascicular block has resolved.  Heart rate is higher compared to 91 bpm on previous EKG.  Recent Labs: 06/09/2019: TSH 1.610 03/24/2020: ALT 14 03/25/2020: Magnesium 2.7 03/29/2020: BUN 14; Creatinine, Ser 0.89; Potassium 4.8; Sodium 139 03/30/2020: Hemoglobin 9.3; Platelets 292    Lipid Panel    Component Value Date/Time   CHOL 154 03/22/2020 0328   CHOL 164 04/06/2013 0909   TRIG 114 03/22/2020 0328   TRIG 187 (H) 04/06/2013 0909   HDL 44 03/22/2020 0328   HDL 40 04/06/2013 0909   CHOLHDL 3.5 03/22/2020 0328   VLDL 23 03/22/2020 0328   LDLCALC 87  03/22/2020 0328   LDLCALC 87 04/06/2013 0909      Wt Readings from Last 3 Encounters:  04/04/20 147 lb 8 oz (66.9 kg)  03/30/20 162 lb 4.8 oz (73.6 kg)  02/02/20 141 lb 6.4 oz (64.1 kg)      Other studies Reviewed:  LHC 03/22/2020  Ost LM to Mid LM lesion is 45% stenosed.  Prox LAD to Mid LAD lesion is 90% stenosed.  Mid LAD lesion is 75% stenosed.  Dist LAD lesion is 70% stenosed.  Ost Cx to Prox Cx lesion is 95% stenosed.  Prox Cx to Mid Cx lesion is 99% stenosed.  2nd Mrg lesion is 85% stenosed.  1st Mrg lesion is 95% stenosed.  Ost RCA lesion is 50% stenosed.  Mid RCA to Dist RCA lesion is 90% stenosed.  RPDA lesion is 70% stenosed.  The left ventricular systolic function is normal.  LV end diastolic pressure is mildly elevated.  The left ventricular ejection fraction is 55-65% by visual estimate.   1. Severe 3 vessel obstructive CAD. 2. Normal LV function 3. 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 Recommend consideration for CABG. Will hold Plavix. Resume IV heparin post cath.    Date of Procedure:03/24/2020  Coronary Artery Bypass Grafting x5 Left Internal Mammary Artery to Distal Left Anterior Descending Coronary Artery; LAD endarterectomy with vein patch angioplasty;Saphenous Vein Graft torightPosterolateralCoronary Artery;Saphenous Vein Graft todistalObtuse Marginal Branch of Left Circumflex Coronary Artery;Sapheonous Vein Graft to Diagonal Branch Coronary Artery; pedicled RIMA to ramus intermedius coronary artery;Endoscopic Vein Harvest fromrightThigh and Lower Leg Completion graft surveillance with indocyanine green fluorescence angiography Bilateral internal mammary artery harvesting Multilevel level rib block with Exparel solution  Surgeon:B. Murvin Natal, MD  Assistant:Conte, T PA-C  Anesthesia:get  Operative Findings: ? Preservedleft ventricular  systolic function ? Goodquality internal mammary artery conduits ? Goodquality saphenous vein conduit ? Fairquality target vessels for grafting   ASSESSMENT AND PLAN:  1.  CAD: Multivessel coronary artery disease  status post catheterization on 03/22/2020.  Subsequent coronary artery bypass grafting on 03/24/2020, 5 vessel (LIMA to LAD, SVG to right posterior lateral coronary artery, SVG graft to distal obtuse marginal branch of the left circumflex coronary artery, saphenous vein graft to the diagonal branch, pedicled RIMA to ramus intermediate.   She is recovering well.  I have asked her to do more deep breathing and coughing as I am hearing some scattered crackles in the upper lobes.  I did review her chest x-ray which was recently completed on 04/04/2020 which revealed small stable right pleural effusion otherwise clear.  2.  Tachycardia: She is currently on metoprolol 25 mg twice daily.  I will increase it to 37.5 mg twice daily for better heart rate control.  She will continue amiodarone 200 mg daily.  I will repeat CBC as she was found to be anemic during hospitalization to evaluate if this is contributing to heart rate.  We will also check a BMET as she has been on furosemide.  3.  PAF: Placed on IV amiodarone initially with p.o. amiodarone 200 mg daily thereafter.  I will check a TSH with blood draw.  Currently in sinus tachycardia.  4.  Hyperlipidemia: Continue atorvastatin 40 mg daily with follow-up labs in 3 months if not completed.  Goal of LDL less than 70.  5.  Hypertension: Blood pressure slightly elevated today.  No changes in her medication regimen at this time continue telmisartan 80 mg daily along with HCTZ and metoprolol.  Metoprolol dose increased as above.   Current medicines are reviewed at length with the patient today.  I have spent 35 minutes dedicated to the care of this patient on the date of this encounter to include pre-visit review of records, assessment, management  and diagnostic testing,with shared decision making.  Labs/ tests ordered today include: TSH. CBC. BMET.   Phill Myron. West Pugh, ANP, AACC   04/05/2020 1:38 PM    [facility] [address] Office [phone] Fax [phone]  Notice: This dictation was prepared with Dragon dictation along with smaller phrase technology. Any transcriptional errors that result from this process are unintentional and may not be corrected upon review.

## 2020-04-06 ENCOUNTER — Encounter: Payer: Self-pay | Admitting: Adult Health

## 2020-04-06 ENCOUNTER — Ambulatory Visit (INDEPENDENT_AMBULATORY_CARE_PROVIDER_SITE_OTHER): Payer: Medicare Other | Admitting: Adult Health

## 2020-04-06 ENCOUNTER — Other Ambulatory Visit: Payer: Self-pay

## 2020-04-06 VITALS — BP 142/54 | HR 118 | Ht 64.5 in | Wt 144.5 lb

## 2020-04-06 DIAGNOSIS — I6529 Occlusion and stenosis of unspecified carotid artery: Secondary | ICD-10-CM

## 2020-04-06 DIAGNOSIS — E785 Hyperlipidemia, unspecified: Secondary | ICD-10-CM | POA: Diagnosis not present

## 2020-04-06 DIAGNOSIS — I480 Paroxysmal atrial fibrillation: Secondary | ICD-10-CM | POA: Diagnosis not present

## 2020-04-06 DIAGNOSIS — R009 Unspecified abnormalities of heart beat: Secondary | ICD-10-CM

## 2020-04-06 DIAGNOSIS — I10 Essential (primary) hypertension: Secondary | ICD-10-CM | POA: Diagnosis not present

## 2020-04-06 DIAGNOSIS — Z951 Presence of aortocoronary bypass graft: Secondary | ICD-10-CM

## 2020-04-06 DIAGNOSIS — E1142 Type 2 diabetes mellitus with diabetic polyneuropathy: Secondary | ICD-10-CM

## 2020-04-06 DIAGNOSIS — Z79899 Other long term (current) drug therapy: Secondary | ICD-10-CM | POA: Diagnosis not present

## 2020-04-06 DIAGNOSIS — I6523 Occlusion and stenosis of bilateral carotid arteries: Secondary | ICD-10-CM | POA: Diagnosis not present

## 2020-04-06 LAB — CBC
Hematocrit: 33.4 % — ABNORMAL LOW (ref 34.0–46.6)
Hemoglobin: 10.3 g/dL — ABNORMAL LOW (ref 11.1–15.9)
MCH: 28.2 pg (ref 26.6–33.0)
MCHC: 30.8 g/dL — ABNORMAL LOW (ref 31.5–35.7)
MCV: 92 fL (ref 79–97)
Platelets: 723 10*3/uL — ABNORMAL HIGH (ref 150–450)
RBC: 3.65 x10E6/uL — ABNORMAL LOW (ref 3.77–5.28)
RDW: 15.8 % — ABNORMAL HIGH (ref 11.7–15.4)
WBC: 17.8 10*3/uL — ABNORMAL HIGH (ref 3.4–10.8)

## 2020-04-06 LAB — TSH: TSH: 1.76 u[IU]/mL (ref 0.450–4.500)

## 2020-04-06 LAB — BASIC METABOLIC PANEL
BUN/Creatinine Ratio: 23 (ref 12–28)
BUN: 22 mg/dL (ref 8–27)
CO2: 25 mmol/L (ref 20–29)
Calcium: 10 mg/dL (ref 8.7–10.3)
Chloride: 97 mmol/L (ref 96–106)
Creatinine, Ser: 0.94 mg/dL (ref 0.57–1.00)
GFR calc Af Amer: 67 mL/min/{1.73_m2} (ref >59)
GFR calc non Af Amer: 58 mL/min/{1.73_m2} — ABNORMAL LOW (ref >59)
Glucose: 215 mg/dL — ABNORMAL HIGH (ref 65–99)
Potassium: 4.7 mmol/L (ref 3.5–5.2)
Sodium: 141 mmol/L (ref 134–144)

## 2020-04-06 MED ORDER — METOPROLOL TARTRATE 25 MG PO TABS: 37.5000 mg | ORAL_TABLET | Freq: Two times a day (BID) | ORAL | 3 refills | Status: DC

## 2020-04-06 NOTE — Patient Instructions (Signed)
Medication Instructions:  INCREASE- Metoprolol Tartrate 37.5 mg(1 1/2 tablets) by mouth twice a day  *If you need a refill on your cardiac medications before your next appointment, please call your pharmacy*   Lab Work: CBC, TSH, BMP Today  If you have labs (blood work) drawn today and your tests are completely normal, you will receive your results only by: Marland Kitchen MyChart Message (if you have MyChart) OR . A paper copy in the mail If you have any lab test that is abnormal or we need to change your treatment, we will call you to review the results.   Testing/Procedures: None Ordered   Follow-Up: At [facility], you and your health needs are our priority.  As part of our continuing mission to provide you with exceptional heart care, we have created designated Provider Care Teams.  These Care Teams include your primary Cardiologist (physician) and Advanced Practice Providers (APPs -  Physician Assistants and Nurse Practitioners) who all work together to provide you with the care you need, when you need it.  We recommend signing up for the patient portal called "MyChart".  Sign up information is provided on this After Visit Summary.  MyChart is used to connect with patients for Virtual Visits (Telemedicine).  Patients are able to view lab/test results, encounter notes, upcoming appointments, etc.  Non-urgent messages can be sent to your provider as well.   To learn more about what you can do with MyChart, go to [email or web address].    Your next appointment:   6 week(s)  The format for your next appointment:   In Person  Provider:   You may see [name], MD or one of the following Advanced Practice Providers on your designated Care Team:    Kerin Ransom, PA-C  [place], [place]  Coletta Memos, 

## 2020-04-07 ENCOUNTER — Telehealth (HOSPITAL_COMMUNITY): Payer: Self-pay

## 2020-04-07 ENCOUNTER — Telehealth: Payer: Self-pay | Admitting: Adult Health

## 2020-04-07 NOTE — Telephone Encounter (Signed)
Pt insurance is active and benefits verified through Medicare a/b Co-pay 0, DED $203/$203 met, out of pocket 0/0 met, co-insurance 20%. no pre-authorization required. Passport, 04/07/2020@10 :15am, REF# [id number]  2ndary insurance is active and benefits verified through [facility]. Co-pay 0, DED 0/0 met, out of pocket 0/0 met, co-insurance 0%. No pre-authorization required.   Will contact patient to see if she is interested in the Cardiac Rehab Program. If interested, patient will need to complete follow up appt. Once completed, patient will be contacted for scheduling upon review by the RN Navigator.

## 2020-04-07 NOTE — Telephone Encounter (Signed)
    Patient calling for lab results 

## 2020-04-07 NOTE — Telephone Encounter (Signed)
Lm to call back ./cy 

## 2020-04-07 NOTE — Telephone Encounter (Signed)
Follow Up ° °Patient is returning call. Please give patient a call back.  °

## 2020-04-07 NOTE — Telephone Encounter (Signed)
Spoke with pt, she will get in touch with her medical doctor about getting a UA completed.

## 2020-04-08 DIAGNOSIS — R350 Frequency of micturition: Secondary | ICD-10-CM | POA: Diagnosis not present

## 2020-04-08 DIAGNOSIS — D72829 Elevated white blood cell count, unspecified: Secondary | ICD-10-CM | POA: Diagnosis not present

## 2020-04-14 DIAGNOSIS — D72829 Elevated white blood cell count, unspecified: Secondary | ICD-10-CM | POA: Diagnosis not present

## 2020-04-17 ENCOUNTER — Other Ambulatory Visit: Payer: Self-pay | Admitting: Cardiovascular Disease

## 2020-04-17 ENCOUNTER — Other Ambulatory Visit: Payer: Self-pay | Admitting: Endocrinology

## 2020-04-18 ENCOUNTER — Ambulatory Visit: Payer: Medicare Other | Admitting: Cardiothoracic Surgery

## 2020-04-18 ENCOUNTER — Encounter: Payer: Self-pay | Admitting: Family Medicine

## 2020-04-18 ENCOUNTER — Encounter (INDEPENDENT_AMBULATORY_CARE_PROVIDER_SITE_OTHER): Payer: Medicare Other | Admitting: Ophthalmology

## 2020-04-25 ENCOUNTER — Ambulatory Visit: Payer: Self-pay | Admitting: Cardiothoracic Surgery

## 2020-04-25 ENCOUNTER — Other Ambulatory Visit: Payer: Self-pay

## 2020-04-25 VITALS — BP 109/63 | HR 90 | Temp 98.1°F | Resp 20 | Ht 64.5 in | Wt 138.0 lb

## 2020-04-25 DIAGNOSIS — I2510 Atherosclerotic heart disease of native coronary artery without angina pectoris: Secondary | ICD-10-CM

## 2020-04-25 DIAGNOSIS — Z951 Presence of aortocoronary bypass graft: Secondary | ICD-10-CM

## 2020-04-25 NOTE — Progress Notes (Signed)
[address]       [place] [address]             [phone]     CARDIOTHORACIC SURGERY OFFICE NOTE  Referring Provider is [name], MD Primary Cardiologist is [name], MD PCP is [name], MD   HPI:  79 year old lady is status post CABG 1 month ago.  She returns for her second outpatient visit.  There are no complaints of chest pain or shortness of breath.  Her diabetes is well controlled.   Current Outpatient Medications  Medication Sig Dispense Refill  . acetaminophen (TYLENOL) 500 MG tablet Take 1,000 mg by mouth every 6 (six) hours as needed for moderate pain or headache.    Marland Kitchen amiodarone (PACERONE) 200 MG tablet Take 1 tablet (200 mg total) by mouth daily. 30 tablet 1  . aspirin EC 81 MG tablet Take 81 mg by mouth every evening.    Marland Kitchen atorvastatin (LIPITOR) 40 MG tablet Take 1 tablet (40 mg total) by mouth daily. 30 tablet 1  . calcium carbonate (TUMS - DOSED IN MG ELEMENTAL CALCIUM) 500 MG chewable tablet Chew 2 tablets by mouth daily as needed for indigestion or heartburn.    Marland Kitchen CINNAMON PO Take 1,000 mg by mouth in the morning, at noon, and at bedtime.     . clopidogrel (PLAVIX) 75 MG tablet Take 1 tablet by mouth once daily (Patient taking differently: Take 75 mg by mouth daily. ) 90 tablet 2  . colchicine 0.6 MG tablet Take 0.5 tablets (0.3 mg total) by mouth 2 (two) times daily. 30 tablet 0  . glipiZIDE (GLUCOTROL XL) 5 MG 24 hr tablet Take 1 tablet by mouth once daily with breakfast 90 tablet 0  . hydrochlorothiazide (HYDRODIURIL) 12.5 MG tablet Take 1 tablet (12.5 mg total) by mouth daily. 90 tablet 2  . INVOKANA 100 MG TABS tablet TAKE 1 TABLET BY MOUTH BEFORE BREAKFAST 90 tablet 0  . metFORMIN (GLUCOPHAGE) 1000 MG tablet Take 1 tablet (1,000 mg total) by mouth 2 (two) times daily. 180 tablet 1  . metoprolol tartrate (LOPRESSOR) 25 MG tablet Take 1.5 tablets (37.5 mg total) by mouth 2 (two) times daily. 270 tablet 3  .  OZEMPIC, 0.25 OR 0.5 MG/DOSE, 2 MG/1.5ML SOPN INJECT 0.5 MG INTO THE SKIN ONCE A WEEK (Patient taking differently: Inject 0.5 mg into the skin every Monday. ) 2 mL 1  . pantoprazole (PROTONIX) 40 MG tablet Take 1 tablet (40 mg total) by mouth daily. (Patient taking differently: Take 40 mg by mouth every Monday, Wednesday, and Friday at 8 PM. ) 90 tablet 3  . Polyethyl Glycol-Propyl Glycol (SYSTANE OP) Place 1 drop into both eyes 2 (two) times daily as needed (Dry eyes).    . Probiotic Product ([employer] SENIOR PROBIOTIC PO) Take 1 capsule by mouth daily with breakfast.    . telmisartan (MICARDIS) 80 MG tablet Take 1 tablet by mouth once daily 90 tablet 3  . ferrous [id number] C-folic acid (TRINSICON / FOLTRIN) capsule Take 1 capsule by mouth 2 (two) times daily after a meal. (Patient not taking: Reported on 04/25/2020) 60 capsule 2   No current facility-administered medications for this visit.      Physical Exam:   BP 109/63   Pulse 90   Temp 98.1 F (36.7 C) (Skin)   Resp 20   Ht 5' 4.5" (1.638 m)   Wt 62.6 kg   SpO2 99% Comment: RA  BMI 23.32 kg/m   General:  Well-appearing lady in no distress  Chest:   Clear to auscultation  CV:   Regular rate and rhythm  Incisions:  Well-healed  Abdomen:  Soft nontender  Extremities:  No edema  Diagnostic Tests:  No new tests   Impression:  Doing well after CABG  Plan:  Okay to drive Okay to stop colchicine and amiodarone Okay to start cardiac rehab in the next month  I spent in excess of 20 minutes during the conduct of this office consultation and >50% of this time involved direct face-to-face encounter with the patient for counseling and/or coordination of their care.  Level 2                 10 minutes Level 3                 15 minutes Level 4                 25 minutes Level 5                 40 minutes  B.  Murvin Natal, MD 04/25/2020 12:01 PM

## 2020-04-27 ENCOUNTER — Other Ambulatory Visit: Payer: Self-pay

## 2020-05-09 ENCOUNTER — Ambulatory Visit (INDEPENDENT_AMBULATORY_CARE_PROVIDER_SITE_OTHER): Payer: Medicare Other | Admitting: Ophthalmology

## 2020-05-09 ENCOUNTER — Other Ambulatory Visit: Payer: Self-pay

## 2020-05-09 ENCOUNTER — Encounter (INDEPENDENT_AMBULATORY_CARE_PROVIDER_SITE_OTHER): Payer: Self-pay | Admitting: Ophthalmology

## 2020-05-09 DIAGNOSIS — H35372 Puckering of macula, left eye: Secondary | ICD-10-CM

## 2020-05-09 DIAGNOSIS — H47011 Ischemic optic neuropathy, right eye: Secondary | ICD-10-CM | POA: Diagnosis not present

## 2020-05-09 DIAGNOSIS — E113493 Type 2 diabetes mellitus with severe nonproliferative diabetic retinopathy without macular edema, bilateral: Secondary | ICD-10-CM | POA: Insufficient documentation

## 2020-05-09 NOTE — Assessment & Plan Note (Addendum)
The nature of severe nonproliferative diabetic retinopathy discussed with the patient as well as the need for more frequent follow up and likely progression to proliferative disease in the near future. The options of continued observation versus panretinal photocoagulation at this time were reviewed as well as the risks, benefits, and alternatives. More recent option includes the use of ocular injectable medications to slow progression of retinal disease. Tight control of glucose, blood pressure, and serum lipid levels were recommended under the direction of general physician or endocrinologist, as well as avoidance of smoking and maintenance of normal body weight. The 2-year risk of progression to proliferative diabetic retinopathy is 60%.  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

## 2020-05-09 NOTE — Patient Instructions (Signed)
Diabetic Retinopathy Diabetic retinopathy is a disease of the retina. The retina is a light-sensitive membrane at the back of the eye. Retinopathy is a complication of diabetes (diabetes mellitus) and a common cause of bad eyesight (visual impairment). It can eventually cause blindness. Early detection and treatment of diabetic retinopathy is important in keeping your eyes healthy and preventing further damage to them. What are the causes? Diabetic retinopathy is caused by blood sugar (glucose) levels that are too high for an extended period of time. High blood glucose over an extended period of time can:  Damage small blood vessels in the retina, allowing blood to leak through the vessel walls.  Cause new, abnormal blood vessels to grow on the retina. This can scar the retina in the advanced stage of diabetic retinopathy. What increases the risk? You are more likely to develop this condition if:  You have had diabetes for a long time.  You have poorly controlled blood glucose.  You have high blood pressure. What are the signs or symptoms? In the early stages of diabetic retinopathy, there are often no symptoms. As the condition gets worse, symptoms may include:  Blurred vision. This is usually caused by swelling due to abnormal blood glucose levels. The blurriness may go away when blood glucose levels return to normal.  Moving specks or dark spots (floaters) in your vision. These can be caused by a small amount of bleeding (hemorrhage) from retinal blood vessels.  Missing parts of your field of vision, such as vision at the sides of the eyes. This can be caused by larger retinal hemorrhages.  Difficulty reading.  Double vision.  Pain in one or both eyes.  Feeling pressure in one or both eyes.  Trouble seeing straight lines. Straight lines may not look straight.  Redness of the eyes that does not go away. How is this diagnosed? This condition may be diagnosed with an eye exam in  which your eye care specialist puts drops in your eyes that enlarge (dilate) your pupils. This lets your health care provider examine your retina and check for changes in your retinal blood vessels. How is this treated? This condition may be treated by:  Keeping your blood glucose and blood pressure within a target range.  Using a type of laser beam to seal your retinal blood vessels. This stops them from bleeding and decreases pressure in your eye.  Getting shots of medicine in the eye to reduce swelling of the center of the retina (macula). You may be given: ? Anti-VEGF medicine. This medicine can help slow vision loss, and may even improve vision. ? Steroid medicine. Follow these instructions at home:   Follow your diabetes management plan as directed by your health care provider. This may include exercising regularly and eating a healthy diet.  Keep your blood glucose level and your blood pressure in your target range, as directed by your health care provider.  Check your blood glucose as often as directed.  Take over the counter and prescription medicines only as told by your health care provider. This includes insulin and oral diabetes medicine.  Get your eyes checked at least once every year. An eye specialist can usually see diabetic retinopathy developing long before it starts to cause problems. In many cases, it can be treated to prevent complications from occurring.  Do not use any products that contain nicotine or tobacco, such as cigarettes and e-cigarettes. If you need help quitting, ask your health care provider.  Keep all follow-up   visits as told by your health care provider. This is important. Contact a health care provider if:  You notice gradual blurring or other changes in your vision over time.  You notice that your glasses or contact lenses do not make things look as sharp as they once did.  You have trouble reading or seeing details at a distance with either  eye.  You notice a change in your vision or notice that parts of your field of vision appear missing or hazy.  You suddenly see moving specks or dark spots in the field of vision of either eye. Get help right away if:  You have sudden pain or pressure in one or both eyes.  You suddenly lose vision or a curtain or veil seems to come across your eyes.  You have a sudden burst of floaters in your vision. Summary  Diabetic retinopathy is a disease of the retina. The retina is a light-sensitive membrane at the back of the eye. Retinopathy is a complication of diabetes.  Get your eyes checked at least once every year. An eye specialist can usually see diabetic retinopathy developing long before it starts to cause problems. In many cases, it can be treated to prevent complications from occurring.  Keep your blood glucose and your blood pressure in target range. Follow your diabetes management plan as directed by your health care provider.  Protect your eyes. Wear sunglasses and eye protection when needed. This information is not intended to replace advice given to you by your health care provider. Make sure you discuss any questions you have with your health care provider. Document Revised: 10/23/2017 Document Reviewed: 10/15/2016 [employer] Patient Education  2020 [employer]  

## 2020-05-09 NOTE — Progress Notes (Signed)
05/09/2020     CHIEF COMPLAINT Patient presents for Retina Follow Up   HISTORY OF PRESENT ILLNESS: [name] is a 79 y.o. female who presents to the clinic today for:   HPI    Retina Follow Up    Patient presents with  Diabetic Retinopathy.  In both eyes.  Severity is moderate.  Duration of 4.  Since onset it is stable.  I, the attending physician,  performed the HPI with the patient and updated documentation appropriately.          Comments    4 Month NPDR f\u OU. FP Pt states no changes in vision. Denies any complaints. BGL: 109 A1C: 6.0        Last edited by Tilda Franco on 05/09/2020  8:54 AM. (History)      Referring physician: [name], MD [place],  [place] [id number]  HISTORICAL INFORMATION:   Selected notes from the MEDICAL RECORD NUMBER    Lab Results  Component Value Date   HGBA1C 6.9 (H) 03/24/2020     CURRENT MEDICATIONS: Current Outpatient Medications (Ophthalmic Drugs)  Medication Sig  . Polyethyl Glycol-Propyl Glycol (SYSTANE OP) Place 1 drop into both eyes 2 (two) times daily as needed (Dry eyes).   No current facility-administered medications for this visit. (Ophthalmic Drugs)   Current Outpatient Medications (Other)  Medication Sig  . acetaminophen (TYLENOL) 500 MG tablet Take 1,000 mg by mouth every 6 (six) hours as needed for moderate pain or headache.  Marland Kitchen amiodarone (PACERONE) 200 MG tablet Take 1 tablet (200 mg total) by mouth daily.  Marland Kitchen aspirin EC 81 MG tablet Take 81 mg by mouth every evening.  Marland Kitchen atorvastatin (LIPITOR) 40 MG tablet Take 1 tablet (40 mg total) by mouth daily.  . calcium carbonate (TUMS - DOSED IN MG ELEMENTAL CALCIUM) 500 MG chewable tablet Chew 2 tablets by mouth daily as needed for indigestion or heartburn.  Marland Kitchen CINNAMON PO Take 1,000 mg by mouth in the morning, at noon, and at bedtime.   . clopidogrel (PLAVIX) 75 MG tablet Take 1 tablet by mouth once daily (Patient taking  differently: Take 75 mg by mouth daily. )  . colchicine 0.6 MG tablet Take 0.5 tablets (0.3 mg total) by mouth 2 (two) times daily.  . ferrous [id number] C-folic acid (TRINSICON / FOLTRIN) capsule Take 1 capsule by mouth 2 (two) times daily after a meal. (Patient not taking: Reported on 04/25/2020)  . glipiZIDE (GLUCOTROL XL) 5 MG 24 hr tablet Take 1 tablet by mouth once daily with breakfast  . hydrochlorothiazide (HYDRODIURIL) 12.5 MG tablet Take 1 tablet (12.5 mg total) by mouth daily.  . INVOKANA 100 MG TABS tablet TAKE 1 TABLET BY MOUTH BEFORE BREAKFAST  . metFORMIN (GLUCOPHAGE) 1000 MG tablet Take 1 tablet (1,000 mg total) by mouth 2 (two) times daily.  . metoprolol tartrate (LOPRESSOR) 25 MG tablet Take 1.5 tablets (37.5 mg total) by mouth 2 (two) times daily.  Marland Kitchen OZEMPIC, 0.25 OR 0.5 MG/DOSE, 2 MG/1.5ML SOPN INJECT 0.5 MG INTO THE SKIN ONCE A WEEK (Patient taking differently: Inject 0.5 mg into the skin every Monday. )  . pantoprazole (PROTONIX) 40 MG tablet Take 1 tablet (40 mg total) by mouth daily. (Patient taking differently: Take 40 mg by mouth every Monday, Wednesday, and Friday at 8 PM. )  . Probiotic Product ([employer] SENIOR PROBIOTIC PO) Take 1 capsule by mouth daily with breakfast.  . telmisartan (MICARDIS) 80 MG tablet  Take 1 tablet by mouth once daily   No current facility-administered medications for this visit. (Other)      REVIEW OF SYSTEMS: ROS    Positive for: Endocrine   Last edited by Tilda Franco on 05/09/2020  8:54 AM. (History)       ALLERGIES Allergies  Allergen Reactions  . Bactrim [Sulfamethoxazole-Trimethoprim] Other (See Comments)    ^ K+( elevated)   . Demerol [Meperidine]     Delusional   . Scopolamine     Delusional   . Versed [Midazolam] Anxiety    Frantic, out of my mind, agitated     PAST MEDICAL HISTORY Past Medical History:  Diagnosis Date  . Arthritis   . Cancer ([place])    removal from nose - MOSE procedure   . Complication  of anesthesia    VERSED- agitated, muscle spasms, "jerking" , frantic , (never had this occurence in the pas)   . Diabetes mellitus without complication ([place])   . GERD (gastroesophageal reflux disease)   . Heart murmur   . History of hiatal hernia   . Hyperlipidemia   . Hypertension 11/20/11   ECHO- EF>55% Borderline concentric left ventricular hypertrophy. There is a small calcified mass in the L:A near the LA appendage. No valvular masses seen with associated mitral annular calcification. LA Volume/ BSA27.4 ml/m2 No AS. Right ventricular systolic pressure is elevated at 101mmHg.  Marland Kitchen Peripheral vascular disease ([place])   . Pneumonia    not hosp.   . 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  PTCAs with drug coated balloon Lt ant tibial artery and Lt popliteal artery  11/26/2019   Past Surgical History:  Procedure Laterality Date  . ABDOMINAL AORTAGRAM  11/25/2019   ABDOMINAL AORTOGRAM W/LOWER EXTREMITY   . ABDOMINAL AORTOGRAM N/A 06/15/2019   Procedure: ABDOMINAL AORTOGRAM;  Surgeon: [name], MD;  Location: [facility];  Service: Cardiovascular;  Laterality: N/A;  . ABDOMINAL AORTOGRAM W/LOWER EXTREMITY N/A 11/25/2019   Procedure: ABDOMINAL AORTOGRAM W/LOWER EXTREMITY;  Surgeon: [name], MD;  Location: [facility];  Service: Cardiovascular;  Laterality: N/A;  Lt leg  . APPENDECTOMY    . APPLICATION OF WOUND VAC Right 07/21/2019   Procedure: Application Of Prevena Wound Vac Right Groin;  Surgeon: [name], MD;  Location: [facility];  Service: Vascular;  Laterality: Right;  . CARPAL TUNNEL RELEASE    . CARPAL TUNNEL RELEASE    . CESAREAN SECTION     x 2  . COLONOSCOPY    . 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 ENDARTERECTOMY FEMORAL Right 07/21/2019   Procedure: RIGHT ENDARTERECTOMY FEMORAL WITH PATCH ANGIOPLASTY;  Surgeon: [name], MD;  Location: [facility];  Service: Vascular;  Laterality: Right;  . ENDOVEIN HARVEST OF GREATER SAPHENOUS VEIN Right 03/24/2020   Procedure: ENDOVEIN HARVEST OF [place];  Surgeon: [name], MD;  Location: [facility];  Service: Open Heart Surgery;  Laterality: Right;  . INTRAVASCULAR LITHOTRIPSY  11/25/2019   Procedure: INTRAVASCULAR LITHOTRIPSY;  Surgeon: [name], MD;  Location: [facility];  Service: Cardiovascular;;  LT. SFA  .  LEFT HEART CATH AND CORONARY ANGIOGRAPHY N/A 03/22/2020   Procedure: LEFT HEART CATH AND CORONARY ANGIOGRAPHY;  Surgeon: Martinique, Peter M, MD;  Location: [facility];  Service: Cardiovascular;  Laterality: N/A;  . LOWER EXTREMITY ANGIOGRAPHY Bilateral 06/15/2019   Procedure: Lower Extremity Angiography;  Surgeon: [name], MD;  Location: [facility];  Service: Cardiovascular;  Laterality: Bilateral;  . LOWER EXTREMITY ANGIOGRAPHY Right 08/03/2019   Procedure: LOWER EXTREMITY ANGIOGRAPHY;  Surgeon: [name], MD;  Location: [facility];  Service: Cardiovascular;  Laterality: Right;  . PATCH ANGIOPLASTY Right 07/21/2019   Procedure: Patch Angioplasty Right Femoral Artery;  Surgeon: [name], MD;  Location: [facility];  Service: Vascular;  Laterality: Right;  . PERIPHERAL VASCULAR ATHERECTOMY  08/03/2019   Procedure: PERIPHERAL VASCULAR ATHERECTOMY;  Surgeon: [name], MD;  Location: [facility];  Service: Cardiovascular;;  right SFA, right TP trunk  . PERIPHERAL VASCULAR ATHERECTOMY  11/25/2019   Procedure: PERIPHERAL VASCULAR ATHERECTOMY;  Surgeon: [name], MD;  Location: [facility];  Service: Cardiovascular;;  Lt.  POPLITEAL and AT  . PERIPHERAL VASCULAR BALLOON ANGIOPLASTY Left  06/15/2019   Procedure: PERIPHERAL VASCULAR BALLOON ANGIOPLASTY;  Surgeon: [name], MD;  Location: [facility];  Service: Cardiovascular;  Laterality: Left;  SFA UNSUCCESSFUL UNABLE TO CROSS LESION  . PERIPHERAL VASCULAR BALLOON ANGIOPLASTY  08/03/2019   Procedure: PERIPHERAL VASCULAR BALLOON ANGIOPLASTY;  Surgeon: [name], MD;  Location: [facility];  Service: Cardiovascular;;  right SFA, Right TP trunk  . TEE WITHOUT CARDIOVERSION N/A 03/24/2020   Procedure: TRANSESOPHAGEAL ECHOCARDIOGRAM (TEE);  Surgeon: [name], MD;  Location: [facility];  Service: Open Heart Surgery;  Laterality: N/A;  . TONSILLECTOMY     and adenoidectomy  . 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 TUBAL LIGATION      FAMILY HISTORY Family History  Problem Relation Age of Onset  . Cancer - Prostate Father   . Cancer - Colon Father   . Stroke Mother   . Hypertension Mother   . Hyperlipidemia Mother   . Melanoma Brother     SOCIAL HISTORY Social History   Tobacco Use  . Smoking status: Former Smoker    Quit date: 03/31/1972    Years since quitting: 48.1  . Smokeless tobacco: Never Used  Vaping Use  . Vaping Use: Never used  Substance Use Topics  . Alcohol use: No  . Drug use: No         OPHTHALMIC EXAM:  Base Eye Exam    Visual Acuity (Snellen - Linear)      Right Left   Dist [place] 20/25 -2 20/50 -2   Dist ph [place]  20/30 -2       Tonometry (Tonopen, 8:58 AM)      Right Left   Pressure 14 16       Pupils      Pupils Dark Light Shape React APD   Right PERRL 3 2 Round Brisk None   Left PERRL 3 2 Round Brisk None       Visual Fields (Counting fingers)      Left Right    Full Full       Neuro/Psych    Oriented x3: Yes   Mood/Affect: Normal       Dilation  Both eyes: 1.0% Mydriacyl, 2.5% Phenylephrine @ 8:58 AM          Slit Lamp and Fundus Exam    External Exam      Right Left   External Normal Normal       Slit Lamp Exam      Right Left   Lids/Lashes Normal Normal   Conjunctiva/Sclera White and quiet White and quiet   Cornea Clear Clear   Anterior Chamber Deep and quiet Deep and quiet   Iris Round and reactive Round and reactive   Lens Centered posterior chamber intraocular lens Centered posterior chamber intraocular lens   Anterior Vitreous Normal Normal       Fundus Exam      Right Left   Posterior Vitreous Normal Normal   Disc Normal Normal   C/D Ratio 0.0 0.0   Macula no macular thickening, Microaneurysms, no clinically significant macular edema no macular thickening, Microaneurysms, no clinically significant macular edema   Vessels NPDR-Severe NPDR-Severe   Periphery Normal Normal          IMAGING AND PROCEDURES  Imaging and Procedures for 05/09/20  Color Fundus Photography Optos - OU - Both Eyes       Right Eye Progression has been stable. Disc findings include normal observations. Macula : microaneurysms. Vessels : IRMA. Periphery : normal observations.   Left Eye Progression has been stable.                 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 The options of continued observation versus panretinal photocoagulation at this time were reviewed as well as the risks, benefits, and alternatives. More recent option includes the use of ocular injectable medications to slow progression of retinal disease. Tight control of glucose, blood pressure, and serum lipid levels were recommended under the direction of general physician or endocrinologist, as well as avoidance of smoking and maintenance of  normal body weight. The 2-year risk of progression to proliferative diabetic retinopathy is 60%.  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 Left epiretinal membrane  H35.372     1.  We will plan PRP in the temporal window anterior retina OD next  2.  3.  Ophthalmic Meds Ordered this visit:  No orders of the defined types were placed in this encounter.      Return in about 4 months (around 09/08/2020) for DILATE OU, COLOR FP, PRP, OD.  Patient Instructions  Diabetic Retinopathy Diabetic retinopathy is a disease of the retina. The retina is a light-sensitive membrane at the back of the eye. Retinopathy is a complication of diabetes (diabetes mellitus) and a common cause of bad eyesight (visual impairment). It can eventually cause blindness. Early detection and treatment of  diabetic retinopathy is important in keeping your eyes healthy and preventing further damage to them. What are the causes? Diabetic retinopathy is caused by blood sugar (glucose) levels that are too high for an extended period of time. High blood glucose over an extended period of time can:  Damage small blood vessels in the retina, allowing blood to leak through the vessel walls.  Cause new, abnormal blood vessels to grow on the retina. This can scar the retina in the advanced stage of diabetic retinopathy. What increases the risk? You are more likely to develop this  condition if:  You have had diabetes for a long time.  You have poorly controlled blood glucose.  You have high blood pressure. What are the signs or symptoms? In the early stages of diabetic retinopathy, there are often no symptoms. As the condition gets worse, symptoms may include:  Blurred vision. This is usually caused by swelling due to abnormal blood glucose levels. The blurriness may go away when blood glucose levels return to normal.  Moving specks or dark spots (floaters) in your vision. These can be caused by a small amount of bleeding (hemorrhage) from retinal blood vessels.  Missing parts of your field of vision, such as vision at the sides of the eyes. This can be caused by larger retinal hemorrhages.  Difficulty reading.  Double vision.  Pain in one or both eyes.  Feeling pressure in one or both eyes.  Trouble seeing straight lines. Straight lines may not look straight.  Redness of the eyes that does not go away. How is this diagnosed? This condition may be diagnosed with an eye exam in which your eye care specialist puts drops in your eyes that enlarge (dilate) your pupils. This lets your health care provider examine your retina and check for changes in your retinal blood vessels. How is this treated? This condition may be treated by:  Keeping your blood glucose and blood pressure within a target range.  Using a type of laser beam to seal your retinal blood vessels. This stops them from bleeding and decreases pressure in your eye.  Getting shots of medicine in the eye to reduce swelling of the center of the retina (macula). You may be given: ? Anti-VEGF medicine. This medicine can help slow vision loss, and may even improve vision. ? Steroid medicine. Follow these instructions at home:   Follow your diabetes management plan as directed by your health care provider. This may include exercising regularly and eating a healthy diet.  Keep your blood glucose  level and your blood pressure in your target range, as directed by your health care provider.  Check your blood glucose as often as directed.  Take over the counter and prescription medicines only as told by your health care provider. This includes insulin and oral diabetes medicine.  Get your eyes checked at least once every year. An eye specialist can usually see diabetic retinopathy developing long before it starts to cause problems. In many cases, it can be treated to prevent complications from occurring.  Do not use any products that contain nicotine or tobacco, such as cigarettes and e-cigarettes. If you need help quitting, ask your health care provider.  Keep all follow-up visits as told by your health care provider. This is important. Contact a health care provider if:  You notice gradual blurring or other changes in your vision over time.  You notice that your glasses or contact lenses do not make things look as sharp as they  once did.  You have trouble reading or seeing details at a distance with either eye.  You notice a change in your vision or notice that parts of your field of vision appear missing or hazy.  You suddenly see moving specks or dark spots in the field of vision of either eye. Get help right away if:  You have sudden pain or pressure in one or both eyes.  You suddenly lose vision or a curtain or veil seems to come across your eyes.  You have a sudden burst of floaters in your vision. Summary  Diabetic retinopathy is a disease of the retina. The retina is a light-sensitive membrane at the back of the eye. Retinopathy is a complication of diabetes.  Get your eyes checked at least once every year. An eye specialist can usually see diabetic retinopathy developing long before it starts to cause problems. In many cases, it can be treated to prevent complications from occurring.  Keep your blood glucose and your blood pressure in target range. Follow your  diabetes management plan as directed by your health care provider.  Protect your eyes. Wear sunglasses and eye protection when needed. This information is not intended to replace advice given to you by your health care provider. Make sure you discuss any questions you have with your health care provider. Document Revised: 10/23/2017 Document Reviewed: 10/15/2016 Elsevier Patient Education  2020 [employer].     Explained the diagnoses, plan, and follow up with the patient and they expressed understanding.  Patient expressed understanding of the importance of proper follow up care.   Clent Demark Woodroe Vogan M.D. Diseases & Surgery of the Retina and Vitreous Retina & Diabetic [place] 05/09/20     Abbreviations: M myopia (nearsighted); A astigmatism; H hyperopia (farsighted); P presbyopia; Mrx spectacle prescription;  CTL contact lenses; OD right eye; OS left eye; OU both eyes  XT exotropia; ET esotropia; PEK punctate epithelial keratitis; PEE punctate epithelial erosions; DES dry eye syndrome; MGD meibomian gland dysfunction; ATs artificial tears; PFAT's preservative free artificial tears; [place] nuclear sclerotic cataract; PSC posterior subcapsular cataract; ERM epi-retinal membrane; PVD posterior vitreous detachment; RD retinal detachment; DM diabetes mellitus; DR diabetic retinopathy; NPDR non-proliferative diabetic retinopathy; PDR proliferative diabetic retinopathy; CSME clinically significant macular edema; DME diabetic macular edema; dbh dot blot hemorrhages; CWS cotton wool spot; POAG primary open angle glaucoma; C/D cup-to-disc ratio; HVF humphrey visual field; GVF goldmann visual field; OCT optical coherence tomography; IOP intraocular pressure; BRVO Branch retinal vein occlusion; CRVO central retinal vein occlusion; CRAO central retinal artery occlusion; BRAO branch retinal artery occlusion; RT retinal tear; SB scleral buckle; PPV pars plana vitrectomy; VH Vitreous hemorrhage; PRP panretinal  laser photocoagulation; IVK intravitreal kenalog; VMT vitreomacular traction; MH Macular hole;  NVD neovascularization of the disc; NVE neovascularization elsewhere; AREDS age related eye disease study; ARMD age related macular degeneration; POAG primary open angle glaucoma; EBMD epithelial/anterior basement membrane dystrophy; ACIOL anterior chamber intraocular lens; IOL intraocular lens; PCIOL posterior chamber intraocular lens; Phaco/IOL phacoemulsification with intraocular lens placement; [place] photorefractive keratectomy; LASIK laser assisted in situ keratomileusis; HTN hypertension; DM diabetes mellitus; COPD chronic obstructive pulmonary disease

## 2020-05-10 ENCOUNTER — Encounter (INDEPENDENT_AMBULATORY_CARE_PROVIDER_SITE_OTHER): Payer: Medicare Other | Admitting: Ophthalmology

## 2020-05-10 ENCOUNTER — Telehealth: Payer: Self-pay | Admitting: *Deleted

## 2020-05-10 NOTE — Telephone Encounter (Signed)
A message was left, re: her follow up visit. 

## 2020-05-11 DIAGNOSIS — H0102A Squamous blepharitis right eye, upper and lower eyelids: Secondary | ICD-10-CM | POA: Diagnosis not present

## 2020-05-11 DIAGNOSIS — H02834 Dermatochalasis of left upper eyelid: Secondary | ICD-10-CM | POA: Diagnosis not present

## 2020-05-11 DIAGNOSIS — Z961 Presence of intraocular lens: Secondary | ICD-10-CM | POA: Diagnosis not present

## 2020-05-11 DIAGNOSIS — H02831 Dermatochalasis of right upper eyelid: Secondary | ICD-10-CM | POA: Diagnosis not present

## 2020-05-11 DIAGNOSIS — H0102B Squamous blepharitis left eye, upper and lower eyelids: Secondary | ICD-10-CM | POA: Diagnosis not present

## 2020-05-11 DIAGNOSIS — H47011 Ischemic optic neuropathy, right eye: Secondary | ICD-10-CM | POA: Diagnosis not present

## 2020-05-18 ENCOUNTER — Telehealth (HOSPITAL_COMMUNITY): Payer: Self-pay | Admitting: *Deleted

## 2020-05-18 NOTE — Telephone Encounter (Signed)
To call cardiac rehab regarding upcoming appointment for orientation.Barnet Pall, RN,BSN 05/18/2020 1:31 PM

## 2020-05-20 ENCOUNTER — Telehealth (HOSPITAL_COMMUNITY): Payer: Self-pay

## 2020-05-20 ENCOUNTER — Other Ambulatory Visit: Payer: Self-pay

## 2020-05-20 ENCOUNTER — Encounter (HOSPITAL_COMMUNITY)
Admission: RE | Admit: 2020-05-20 | Discharge: 2020-05-20 | Disposition: A | Discharge status: Home / Self Care | Payer: Medicare Other | Source: Ambulatory Visit | Attending: Cardiovascular Disease | Admitting: Cardiovascular Disease

## 2020-05-20 DIAGNOSIS — I214 Non-ST elevation (NSTEMI) myocardial infarction: Secondary | ICD-10-CM

## 2020-05-20 DIAGNOSIS — Z951 Presence of aortocoronary bypass graft: Secondary | ICD-10-CM

## 2020-05-20 NOTE — Telephone Encounter (Addendum)
Cardiac Rehab Note:  Successful telephone encounter to [name] to follow up on her inability to wear appropriate shoes for cardiac rehab exercise. [name] understands that she needs closed toed/heeled shoes for exercise. She does not wish to change her shoe style of open toe/open heel at this time. She is interested in pursuing virtual cardiac rehab. She agrees to orientation as scheduled for in-person assessment. See VCR consent below.  Pt is interested in participating in Virtual Cardiac and Pulmonary Rehab. Pt advised that Virtual Cardiac and Pulmonary Rehab is provided at no cost to the patient.  Checklist:  1. Pt has smart device  ie smartphone and/or ipad for downloading an app  Yes 2. Reliable internet/wifi service    Yes 3. Understands how to use their smartphone and navigate within an app.  Yes            Confirm Consent - In the setting of the current Covid19 crisis, you are scheduled for a phone visit with your Cardiac or Pulmonary team member.  Just as we do with many in-gym visits, in order for you to participate in this visit, we must obtain consent.  If you'd like, I can send this to your mychart (if signed up) or email for you to review.  Otherwise, I can obtain your verbal consent now.  By agreeing to a telephone visit, we'd like you to understand that the technology does not allow for your Cardiac or Pulmonary Rehab team member to perform a physical assessment, and thus may limit their ability to fully assess your ability to perform exercise programs. If your provider identifies any concerns that need to be evaluated in person, we will make arrangements to do so.  Finally, though the technology is pretty good, we cannot assure that it will always work on either your or our end and we cannot ensure that we have a secure connection.  Cardiac and Pulmonary Rehab Telehealth visits and "At Home" cardiac and pulmonary rehab are provided at no cost to you.        Are you willing  to proceed?"        STAFF: Did the patient verbally acknowledge consent to telehealth visit? Document YES/NO here: Yes        Date 05/20/20 @ [employer] [place]. Rollene Rotunda RN, BSN [place]. [facility]  Cardiac and Pulmonary Rehabilitation Phone: [phone]       Fax: [phone]

## 2020-05-20 NOTE — Progress Notes (Addendum)
Cardiac Rehab Telephone Note:  Successful telephone encounter to [name] to confirm Cardiac Rehab orientation appointment for 05/24/20 at 9:30 am. Nursing assessment completed. Patient questions answered. Patient is concerned regarding requirement for closed toe and closed heel shoes. 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 She describes wearing a sandel with socks on the left and will not consider wearing any other type of shoe. Informed patient that she may not be able to attend CR however will follow up with AD and contact patient with information. Patient is also offered virtual cardiac rehab however she will need to complete cardiac rehab orientation including an exercise endurance test. Patient states she may be interested in the VCR program if she is allow to participate in the endurance test with open toed and opened heeled shoes.  Melvern Ramone E. Rollene Rotunda RN, BSN [place]. [facility]  Cardiac and Pulmonary Rehabilitation Phone: [phone] Fax: [phone]

## 2020-05-23 ENCOUNTER — Telehealth (HOSPITAL_COMMUNITY): Payer: Self-pay | Admitting: Pharmacist

## 2020-05-23 NOTE — Telephone Encounter (Signed)
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  She recently tried taking 10mg  of melatonin for sleep after talking to PCP, caused diarrhea and she stopped taking it. Patient accidentally took a higher dose than intended. We discussed taking 1mg  if she needs it in the future but for now she is sleeping well and her diarrhea is resolved.   Does the patient  feel that his/her medications are working for him/her?  yes  Has the patient been experiencing any side effects to the medications prescribed?  No   Does the patient measure his/her own blood pressure or blood glucose at home?  yes  Blood glucose: 89-120 Blood pressure: 130/72  Does the patient have any problems obtaining medications due to transportation or finances?   yes  Understanding of regimen: excellent Understanding of indications: excellent Potential of compliance: excellent    Pharmacist Intervention: Discussed taking a lower dose of melatonin if needed in the future.    Norina Buzzard, PharmD PGY1 Pharmacy Resident 05/23/2020 1:13 PM

## 2020-05-24 ENCOUNTER — Encounter (HOSPITAL_COMMUNITY)
Admission: RE | Admit: 2020-05-24 | Discharge: 2020-05-24 | Disposition: A | Discharge status: Home / Self Care | Payer: Medicare Other | Source: Ambulatory Visit | Attending: Cardiovascular Disease | Admitting: Cardiovascular Disease

## 2020-05-24 ENCOUNTER — Other Ambulatory Visit: Payer: Self-pay

## 2020-05-24 VITALS — BP 122/72 | HR 81 | Ht 64.5 in | Wt 140.4 lb

## 2020-05-24 DIAGNOSIS — Z951 Presence of aortocoronary bypass graft: Secondary | ICD-10-CM

## 2020-05-24 DIAGNOSIS — I214 Non-ST elevation (NSTEMI) myocardial infarction: Secondary | ICD-10-CM

## 2020-05-24 NOTE — Progress Notes (Signed)
Reviewed home exercise guidelines with patient including endpoints, temperature precautions, target heart rate and rate of perceived exertion. Pt is walking 5-10 minutes several times a day in her home as her mode of home exercise. Encouraged patient to walk 10 minutes, 3 times/day taking rest breaks as needed secondary to right foot and left hip pain, and patient is amenable to this. Patient thinks her daughter has 5 lb weights that she can use for her resistance training, but otherwise will use household items (i.e. can food, water bottle) for her resistance training exercises. Pt voices understanding of instructions given.  Sol Passer, MS, ACSM CEP

## 2020-05-25 ENCOUNTER — Encounter (HOSPITAL_COMMUNITY): Payer: Self-pay

## 2020-05-25 NOTE — Progress Notes (Signed)
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  acetaminophen (TYLENOL) 500 MG tablet, Take 1,000 mg by mouth every 6 (six) hours as needed for moderate pain or headache., Disp: , Rfl:  .  amiodarone (PACERONE) 200 MG tablet, Take 1 tablet (200 mg total) by mouth daily. (Patient not taking: Reported on 05/23/2020), Disp: 30 tablet, Rfl: 1 .  aspirin EC 81 MG tablet, Take 81 mg by mouth every evening., Disp: , Rfl:  .  atorvastatin (LIPITOR) 40 MG tablet, Take 1 tablet (40 mg total) by mouth daily., Disp: 30 tablet, Rfl: 1 .  calcium carbonate (TUMS - DOSED IN MG ELEMENTAL CALCIUM) 500 MG chewable tablet, Chew 2 tablets by mouth daily as needed for indigestion or heartburn., Disp: , Rfl:  .  CINNAMON PO, Take 1,000 mg by mouth in the morning, at noon, and at bedtime. , Disp: , Rfl:  .  clopidogrel (PLAVIX) 75 MG tablet, Take 1 tablet by mouth once daily (Patient taking differently: Take 75 mg by mouth daily. ), Disp: 90 tablet, Rfl: 2 .  colchicine 0.6 MG tablet, Take 0.5 tablets (0.3 mg total) by mouth 2 (two) times daily. (Patient not taking: Reported on 05/23/2020), Disp: 30 tablet, Rfl: 0 .  ferrous [id number] C-folic acid (TRINSICON / FOLTRIN) capsule, Take 1 capsule by mouth 2 (two) times daily after a meal. (Patient not taking: Reported on 04/25/2020), Disp: 60 capsule, Rfl: 2 .  glipiZIDE (GLUCOTROL XL) 5 MG 24 hr tablet, Take 1 tablet by mouth once daily with breakfast,  Disp: 90 tablet, Rfl: 0 .  hydrochlorothiazide (HYDRODIURIL) 12.5 MG tablet, Take 1 tablet (12.5 mg total) by mouth daily., Disp: 90 tablet, Rfl: 2 .  INVOKANA 100 MG TABS tablet, TAKE 1 TABLET BY MOUTH BEFORE BREAKFAST, Disp: 90 tablet, Rfl: 0 .  metFORMIN (GLUCOPHAGE) 1000 MG tablet, Take 1 tablet (1,000 mg total) by mouth 2 (two) times daily., Disp: 180 tablet, Rfl: 1 .  metoprolol tartrate (LOPRESSOR) 25 MG tablet, Take 1.5 tablets (37.5 mg total) by mouth 2 (two) times daily., Disp: 270 tablet, Rfl: 3 .  OZEMPIC, 0.25 OR 0.5 MG/DOSE, 2 MG/1.5ML SOPN, INJECT 0.5 MG INTO THE SKIN ONCE A WEEK, Disp: 2 mL, Rfl: 1 .  pantoprazole (PROTONIX) 40 MG tablet, Take 1 tablet (40 mg total) by mouth daily. (Patient taking differently: Take 40 mg by mouth every Monday, Wednesday, and Friday at 8 PM. ), Disp: 90 tablet, Rfl: 3 .  Polyethyl Glycol-Propyl Glycol (SYSTANE OP), Place 1 drop into both eyes 2 (two) times daily as needed (Dry eyes)., Disp: , Rfl:  .  Probiotic Product ([employer] SENIOR PROBIOTIC PO), Take 1 capsule by mouth daily with breakfast., Disp: , Rfl:  .  telmisartan (MICARDIS) 80 MG tablet, Take 1 tablet by mouth once daily, Disp: 90 tablet, Rfl: 3  Past Medical History: Past Medical History:  Diagnosis Date  . Arthritis   . 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 Complication of anesthesia    VERSED- agitated, muscle spasms, "jerking" , frantic , (never had this occurence in the pas)   . Diabetes mellitus without complication ([place])   . GERD (gastroesophageal reflux disease)   . Heart murmur   . History of hiatal hernia   . Hyperlipidemia   . Hypertension 11/20/11   ECHO- EF>55% Borderline concentric left ventricular hypertrophy. There is a small calcified mass in the L:A near the LA appendage. No valvular masses seen with associated mitral annular calcification. LA Volume/ BSA27.4 ml/m2 No AS. Right ventricular systolic pressure is elevated at 71mmHg.  Marland Kitchen Peripheral  vascular disease ([place])   . Pneumonia    not hosp.   . 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  PTCAs with drug coated balloon Lt ant tibial artery and Lt popliteal artery  11/26/2019    Tobacco Use: Social History   Tobacco Use  Smoking Status Former Smoker  . Quit date: 03/31/1972  . Years since quitting: 48.1  Smokeless Tobacco Never Used    Labs: Recent Chemical engineer     Labs for ITP Cardiac and Pulmonary Rehab Latest Ref Rng & Units 03/25/2020 03/25/2020 03/25/2020 03/25/2020 03/26/2020   Cholestrol 0 - 200 mg/dL - - - - -   LDLCALC 0 - 99 mg/dL - - - - -   HDL >40 mg/dL - - - - -   Trlycerides <150 mg/dL - - - - -   Hemoglobin A1c 4.8 - 5.6 % - - - - -   PHART 7.35 - 7.45 7.287(L) 7.344(L) - - -   PCO2ART 32 - 48 mmHg 43.9 42.1 - - -   HCO3 20.0 - 28.0 mmol/L 20.8 22.7 - - -   TCO2 22 - 32 mmol/L 22 24 - - -   ACIDBASEDEF 0.0 - 2.0 mmol/L 5.0(H) 3.0(H) - - -   O2SAT % 98.0 98.0 55.6 58.3 48.6       Capillary Blood Glucose: Lab Results  Component Value Date   GLUCAP 136 (H) 03/30/2020   GLUCAP 165 (H) 03/29/2020   GLUCAP 118 (H) 03/29/2020   GLUCAP 161 (H) 03/29/2020   GLUCAP 134 (H) 03/29/2020      Exercise Target Goals: Exercise Program Goal: Individual exercise prescription set using results from initial 6 min walk test and THRR while considering  patient's activity barriers and safety.   Exercise Prescription Goal: Starting with aerobic activity 30 plus minutes a day, 3 days per week for initial exercise prescription. Provide home exercise prescription and guidelines that participant acknowledges understanding prior to discharge.  Activity Barriers & Risk Stratification:  Activity Barriers & Cardiac Risk Stratification - 05/24/20 0954       Activity Barriers & Cardiac Risk Stratification   Activity Barriers Arthritis;Assistive Device;Other (comment)    Comments All right toes amputated, left hip pain secondary to arthritis.    Cardiac Risk  Stratification High             6 Minute Walk:  6 Minute Walk     Row Name 05/24/20 1044         6 Minute Walk   Phase Initial     Distance 308 feet     Walk Time 3.4 minutes  Stopped at 3 minutes 24 seconds     # of Rest Breaks 1     MPH 1.03     METS 0.89     RPE 12  Perceived Dyspnea  0     VO2 Peak 3.11     Symptoms Yes (comment)     Comments Right ankle pain, 5-6 on the pain scale from boot, left hip pain 2-3 on the pain scale secondary to arthritis, chronic.     Resting HR 81 bpm     Resting BP 122/72     Resting Oxygen Saturation  99 %     Exercise Oxygen Saturation  during 6 min walk 99 %     Max Ex. HR 92 bpm     Max Ex. BP 142/60     2 Minute Post BP 122/68              Oxygen Initial Assessment:    Oxygen Re-Evaluation:    Oxygen Discharge (Final Oxygen Re-Evaluation):    Initial Exercise Prescription:  Initial Exercise Prescription - 05/24/20 1500       Date of Initial Exercise RX and Referring Provider   Date 05/24/20    Referring Provider [name], MD    Expected Discharge Date 08/21/20      Track   Minutes 10   10 minutes 3x/s/day     Prescription Details   Frequency (times per week) 5-7    Duration Progress to 30 minutes of continuous aerobic without signs/symptoms of physical distress      Intensity   THRR 40-80% of Max Heartrate 57-114    Ratings of Perceived Exertion 11-13    Perceived Dyspnea 0-4      Progression   Progression Continue to progress workloads to maintain intensity without signs/symptoms of physical distress.      Resistance Training   Training Prescription Yes    Weight 2-5 lbs    Reps 10-15             Perform Capillary Blood Glucose checks as needed.  Exercise Prescription Changes:    Exercise Comments:  Exercise Comments     Row Name 05/24/20 1111           Exercise Comments Reviewed home exercise guidelines with patient.                Exercise Goals and  Review:  Exercise Goals     Row Name 05/24/20 0954             Exercise Goals   Increase Physical Activity Yes       Intervention Provide advice, education, support and counseling about physical activity/exercise needs.;Develop an individualized exercise prescription for aerobic and resistive training based on initial evaluation findings, risk stratification, comorbidities and participant's personal goals.       Expected Outcomes Short Term: Attend rehab on a regular basis to increase amount of physical activity.;Long Term: Exercising regularly at least 3-5 days a week.;Long Term: Add in home exercise to make exercise part of routine and to increase amount of physical activity.       Increase Strength and Stamina Yes       Intervention Provide advice, education, support and counseling about physical activity/exercise needs.;Develop an individualized exercise prescription for aerobic and resistive training based on initial evaluation findings, risk stratification, comorbidities and participant's personal goals.       Expected Outcomes Short Term: Increase workloads from initial exercise prescription for resistance, speed, and METs.;Short Term: Perform resistance training exercises routinely during rehab and add in resistance training at home;Long Term: Improve cardiorespiratory fitness, muscular endurance and strength as measured by increased METs and functional capacity (6MWT)  Able to understand and use rate of perceived exertion (RPE) scale Yes       Intervention Provide education and explanation on how to use RPE scale       Expected Outcomes Short Term: Able to use RPE daily in rehab to express subjective intensity level;Long Term:  Able to use RPE to guide intensity level when exercising independently       Knowledge and understanding of Target Heart Rate Range (THRR) Yes       Intervention Provide education and explanation of THRR including how the numbers were predicted and where they  are located for reference       Expected Outcomes Short Term: Able to state/look up THRR;Long Term: Able to use THRR to govern intensity when exercising independently;Short Term: Able to use daily as guideline for intensity in rehab       Able to check pulse independently Yes       Intervention Provide education and demonstration on how to check pulse in carotid and radial arteries.;Review the importance of being able to check your own pulse for safety during independent exercise       Expected Outcomes Short Term: Able to explain why pulse checking is important during independent exercise;Long Term: Able to check pulse independently and accurately       Understanding of Exercise Prescription Yes       Intervention Provide education, explanation, and written materials on patient's individual exercise prescription       Expected Outcomes Short Term: Able to explain program exercise prescription;Long Term: Able to explain home exercise prescription to exercise independently                Exercise Goals Re-Evaluation :     Discharge Exercise Prescription (Final Exercise Prescription Changes):    Nutrition:  Target Goals: Understanding of nutrition guidelines, daily intake of sodium 1500mg , cholesterol 200mg , calories 30% from fat and 7% or less from saturated fats, daily to have 5 or more servings of fruits and vegetables.  Biometrics:  Pre Biometrics - 05/24/20 0954       Pre Biometrics   Height 5' 4.5" (1.638 m)    Waist Circumference 36 inches    Hip Circumference 38.25 inches    Waist to Hip Ratio 0.94 %    BMI (Calculated) 23.74    Triceps Skinfold 12 mm    % Body Fat 33.6 %    Grip Strength 26.5 kg    Flexibility 12 in    Single Leg Stand 5.37 seconds               Nutrition Therapy Plan and Nutrition Goals:    Nutrition Assessments:    Nutrition Goals Re-Evaluation:    Nutrition Goals Discharge (Final Nutrition Goals Re-Evaluation):     Psychosocial: Target Goals: Acknowledge presence or absence of significant depression and/or stress, maximize coping skills, provide positive support system. Participant is able to verbalize types and ability to use techniques and skills needed for reducing stress and depression.  Initial Review & Psychosocial Screening:  Initial Psych Review & Screening - 05/25/20 1038       Initial Review   Current issues with None Identified      Family Dynamics   Good Support System? Yes   Yexalen has her daughter who she lives with for support     Barriers   Psychosocial barriers to participate in program There are no identifiable barriers or psychosocial needs.      Screening Interventions  Interventions Encouraged to exercise             Quality of Life Scores:  Quality of Life - 05/24/20 1546       Quality of Life   Select Quality of Life      Quality of Life Scores   Health/Function Pre 28.71 %    Socioeconomic Pre 30 %    Psych/Spiritual Pre 30 %    Family Pre 30 %    GLOBAL Pre 29.42 %            Scores of 19 and below usually indicate a poorer quality of life in these areas.  A difference of  2-3 points is a clinically meaningful difference.  A difference of 2-3 points in the total score of the Quality of Life Index has been associated with significant improvement in overall quality of life, self-image, physical symptoms, and general health in studies assessing change in quality of life.  PHQ-9: Recent Review Flowsheet Data     Depression screen [facility] 2/9 05/25/2020 05/25/2020 12/09/2019 11/11/2019 10/01/2019   Decreased Interest 0 0 0 0 0   Down, Depressed, Hopeless 0 0 0 0 0   PHQ - 2 Score 0 0 0 0 0      Interpretation of Total Score  Total Score Depression Severity:  1-4 = Minimal depression, 5-9 = Mild depression, 10-14 = Moderate depression, 15-19 = Moderately severe depression, 20-27 = Severe depression   Psychosocial Evaluation and Intervention:     Psychosocial Re-Evaluation:    Psychosocial Discharge (Final Psychosocial Re-Evaluation):    Vocational Rehabilitation: Provide vocational rehab assistance to qualifying candidates.   Vocational Rehab Evaluation & Intervention:  Vocational Rehab - 05/25/20 1042       Initial Vocational Rehab Evaluation & Intervention   Assessment shows need for Vocational Rehabilitation No   Briseida is retired and does not need vocational rehab at this time            Education: Education Goals: Education classes will be provided on a weekly basis, covering required topics. Participant will state understanding/return demonstration of topics presented.  Learning Barriers/Preferences:    Education Topics: Hypertension, Hypertension Reduction -Define heart disease and high blood pressure. Discus how high blood pressure affects the body and ways to reduce high blood pressure.    Exercise and Your Heart -Discuss why it is important to exercise, the FITT principles of exercise, normal and abnormal responses to exercise, and how to exercise safely.    Angina -Discuss definition of angina, causes of angina, treatment of angina, and how to decrease risk of having angina.    Cardiac Medications -Review what the following cardiac medications are used for, how they affect the body, and side effects that may occur when taking the medications.  Medications include Aspirin, Beta blockers, calcium channel blockers, ACE Inhibitors, angiotensin receptor blockers, diuretics, digoxin, and antihyperlipidemics.    Congestive Heart Failure -Discuss the definition of CHF, how to live with CHF, the signs and symptoms of CHF, and how keep track of weight and sodium intake.    Heart Disease and Intimacy -Discus the effect sexual activity has on the heart, how changes occur during intimacy as we age, and safety during sexual activity.    Smoking Cessation / COPD -Discuss different methods to quit smoking,  the health benefits of quitting smoking, and the definition of COPD.    Nutrition I: Fats -Discuss the types of cholesterol, what cholesterol does to the heart, and how cholesterol  levels can be controlled.    Nutrition II: Labels -Discuss the different components of food labels and how to read food label    Heart Parts/Heart Disease and PAD -Discuss the anatomy of the heart, the pathway of blood circulation through the heart, and these are affected by heart disease.    Stress I: Signs and Symptoms -Discuss the causes of stress, how stress may lead to anxiety and depression, and ways to limit stress.    Stress II: Relaxation -Discuss different types of relaxation techniques to limit stress.    Warning Signs of Stroke / TIA -Discuss definition of a stroke, what the signs and symptoms are of a stroke, and how to identify when someone is having stroke.    Knowledge Questionnaire Score:  Knowledge Questionnaire Score - 05/24/20 1546       Knowledge Questionnaire Score   Pre Score 20/24             Core Components/Risk Factors/Patient Goals at Admission:  Personal Goals and Risk Factors at Admission - 05/24/20 0954       Core Components/Risk Factors/Patient Goals on Admission   Diabetes Yes    Intervention Provide education about signs/symptoms and action to take for hypo/hyperglycemia.;Provide education about proper nutrition, including hydration, and aerobic/resistive exercise prescription along with prescribed medications to achieve blood glucose in normal ranges: Fasting glucose 65-99 mg/dL    Expected Outcomes Short Term: Participant verbalizes understanding of the signs/symptoms and immediate care of hyper/hypoglycemia, proper foot care and importance of medication, aerobic/resistive exercise and nutrition plan for blood glucose control.;Long Term: Attainment of HbA1C < 7%.    Hypertension Yes    Intervention Provide education on lifestyle modifcations including  regular physical activity/exercise, weight management, moderate sodium restriction and increased consumption of fresh fruit, vegetables, and low fat dairy, alcohol moderation, and smoking cessation.;Monitor prescription use compliance.    Expected Outcomes Short Term: Continued assessment and intervention until BP is < 140/42mm HG in hypertensive participants. < 130/82mm HG in hypertensive participants with diabetes, heart failure or chronic kidney disease.;Long Term: Maintenance of blood pressure at goal levels.    Lipids Yes    Intervention Provide education and support for participant on nutrition & aerobic/resistive exercise along with prescribed medications to achieve LDL 70mg , HDL >40mg .    Expected Outcomes Short Term: Participant states understanding of desired cholesterol values and is compliant with medications prescribed. Participant is following exercise prescription and nutrition guidelines.;Long Term: Cholesterol controlled with medications as prescribed, with individualized exercise RX and with personalized nutrition plan. Value goals: LDL < 70mg , HDL > 40 mg.             Core Components/Risk Factors/Patient Goals Review:     Core Components/Risk Factors/Patient Goals at Discharge (Final Review):     ITP Comments:  ITP Comments     Row Name 05/24/20 0954           ITP Comments Medical Director- Dr. Fransico Him, MD                Comments: Gissell attended orientation on 05/25/2020 to review rules and guidelines for program.  Completed 6 minute walk test, Intitial ITP, and exercise prescription.  VSS. Telemetry-Sinus Rhythm with a downward QRS.  Noriah stopped at 3 minutes and 24 seconds due to right ankle pain 5/6 and left hip pain rated a 2-3 out of 10 with rest. This resolved with rest.Patient used her cane  During her walk test. Patient wears a boot on her  right toe due to toe amputations  Safety measures and social distancing in place per CDC guidelines. Patient is  somewhat deconditioned and will be using the virtual [facility] APP. Patient was able to download the virtual cardiac rehab APP without difficulty.  Patient plans to walk at home with her cane as she can tolerate. Barnet Pall, RN,BSN 05/25/2020 2:21 PM

## 2020-05-27 ENCOUNTER — Encounter (HOSPITAL_COMMUNITY): Admission: RE | Admit: 2020-05-27 | Payer: Medicare Other | Source: Ambulatory Visit

## 2020-05-27 ENCOUNTER — Other Ambulatory Visit: Payer: Self-pay

## 2020-05-27 ENCOUNTER — Encounter (HOSPITAL_COMMUNITY)
Admission: RE | Admit: 2020-05-27 | Discharge: 2020-05-27 | Disposition: A | Discharge status: Home / Self Care | Payer: Medicare Other | Source: Ambulatory Visit | Attending: Cardiovascular Disease | Admitting: Cardiovascular Disease

## 2020-05-27 DIAGNOSIS — I214 Non-ST elevation (NSTEMI) myocardial infarction: Secondary | ICD-10-CM

## 2020-05-27 DIAGNOSIS — Z951 Presence of aortocoronary bypass graft: Secondary | ICD-10-CM

## 2020-05-27 NOTE — Progress Notes (Signed)
Virtual Cardiac Rehab Note:  Successful telephone encounter to [name] to follow up on logged symptoms with exercise in the [facility] Virtual Cardiac Rehab App. [name] states she had discomfort 6/10 in her right foot and left hip after 4 min of walking. This is chronic pain and not new onset. She is encouraged to rest until pain subsides and restart her exercise. States walking is very difficult for her secondary to this pain. Patient agrees. She also informed RN CM that she has returned to shooting pool after two years and won her match last night. She excited to participate in this hobby again as it will improve her mental health. [name] is appreciative of the opportunity to participate in virtual cardiac rehab.  Plan: Will continue to monitor patient via [facility] App  Kestrel Mis E. Rollene Rotunda RN, BSN [place]. [facility]  Cardiac and Pulmonary Rehabilitation Phone: [phone] Fax: [phone]

## 2020-05-27 NOTE — Progress Notes (Signed)
[name] 79 y.o. 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 Arthritis   . Cancer ([facility])    removal from nose - MOSE procedure   . Complication of anesthesia    VERSED- agitated, muscle spasms, "jerking" , frantic , (never had this occurence in the pas)   . Diabetes mellitus without complication ([place])   . GERD (gastroesophageal reflux disease)   . Heart murmur   . History of hiatal hernia   . Hyperlipidemia   . Hypertension 11/20/11   ECHO- EF>55% Borderline concentric left ventricular hypertrophy. There is a small calcified mass in the L:A near the LA appendage. No valvular masses seen with associated mitral annular calcification. LA Volume/ BSA27.4 ml/m2 No AS. Right ventricular systolic pressure is elevated at 36mmHg.  Marland Kitchen Peripheral vascular disease ([place])   . Pneumonia    not hosp.   . 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  PTCAs with drug coated balloon Lt ant tibial artery and Lt popliteal artery  11/26/2019     Medications reviewed.   Current Outpatient Medications:  .  acetaminophen (TYLENOL) 500 MG tablet, Take 1,000 mg by mouth every 6 (six) hours as needed for moderate pain or headache., Disp: , Rfl:  .  amiodarone (PACERONE) 200 MG tablet, Take 1 tablet (200 mg total) by mouth daily. (Patient not taking: Reported on 05/23/2020), Disp: 30 tablet, Rfl: 1 .  aspirin EC 81 MG tablet, Take 81 mg by mouth every evening., Disp: , Rfl:  .  atorvastatin (LIPITOR) 40 MG tablet, Take 1 tablet (40 mg total) by mouth daily., Disp: 30 tablet, Rfl: 1 .  calcium carbonate (TUMS - DOSED IN MG ELEMENTAL CALCIUM) 500 MG chewable tablet, Chew 2 tablets by mouth daily as needed for indigestion or heartburn., Disp: , Rfl:  .  CINNAMON PO, Take 1,000 mg by mouth in the morning, at noon, and at bedtime. , Disp: , Rfl:  .  clopidogrel (PLAVIX) 75 MG tablet, Take 1 tablet by mouth once daily (Patient taking  differently: Take 75 mg by mouth daily. ), Disp: 90 tablet, Rfl: 2 .  colchicine 0.6 MG tablet, Take 0.5 tablets (0.3 mg total) by mouth 2 (two) times daily. (Patient not taking: Reported on 05/23/2020), Disp: 30 tablet, Rfl: 0 .  ferrous [id number] C-folic acid (TRINSICON / FOLTRIN) capsule, Take 1 capsule by mouth 2 (two) times daily after a meal. (Patient not taking: Reported on 04/25/2020), Disp: 60 capsule, Rfl: 2 .  glipiZIDE (GLUCOTROL XL) 5 MG 24 hr tablet, Take 1 tablet by mouth once daily with breakfast, Disp: 90 tablet, Rfl: 0 .  hydrochlorothiazide (HYDRODIURIL) 12.5 MG tablet, Take 1 tablet (12.5 mg total) by mouth daily., Disp: 90 tablet, Rfl: 2 .  INVOKANA 100 MG TABS tablet, TAKE 1 TABLET BY MOUTH BEFORE BREAKFAST, Disp: 90 tablet, Rfl: 0 .  metFORMIN (GLUCOPHAGE) 1000 MG tablet, Take 1 tablet (1,000 mg total) by mouth 2 (two) times daily., Disp: 180 tablet, Rfl: 1 .  metoprolol tartrate (LOPRESSOR) 25 MG tablet, Take 1.5 tablets (37.5 mg total) by mouth 2 (two) times daily., Disp: 270 tablet, Rfl: 3 .  OZEMPIC, 0.25 OR 0.5 MG/DOSE, 2 MG/1.5ML SOPN, INJECT 0.5 MG INTO THE SKIN ONCE A WEEK, Disp: 2 mL, Rfl: 1 .  pantoprazole (PROTONIX) 40 MG tablet, Take 1 tablet (40 mg total) by mouth daily. (Patient taking differently: Take 40 mg by mouth  every Monday, Wednesday, and Friday at 8 PM. ), Disp: 90 tablet, Rfl: 3 .  Polyethyl Glycol-Propyl Glycol (SYSTANE OP), Place 1 drop into both eyes 2 (two) times daily as needed (Dry eyes)., Disp: , Rfl:  .  Probiotic Product ([employer] SENIOR PROBIOTIC PO), Take 1 capsule by mouth daily with breakfast., Disp: , Rfl:  .  telmisartan (MICARDIS) 80 MG tablet, Take 1 tablet by mouth once daily, Disp: 90 tablet, Rfl: 3   Ht Readings from Last 1 Encounters:  05/24/20 5' 4.5" (1.638 m)     Wt Readings from Last 3 Encounters:  05/24/20 140 lb 6.9 oz (63.7 kg)  04/25/20 138 lb (62.6 kg)  04/06/20 144 lb 8 oz (65.5 kg)     There is no height or  weight on file to calculate BMI.   Social History   Tobacco Use  Smoking Status Former Smoker  . Quit date: 03/31/1972  . Years since quitting: 48.1  Smokeless Tobacco Never Used     Lab Results  Component Value Date   CHOL 154 03/22/2020   Lab Results  Component Value Date   HDL 44 03/22/2020   Lab Results  Component Value Date   LDLCALC 87 03/22/2020   Lab Results  Component Value Date   TRIG 114 03/22/2020     Lab Results  Component Value Date   HGBA1C 6.9 (H) 03/24/2020     CBG (last 3)  No results for input(s): GLUCAP in the last 72 hours.   Nutrition Note  Spoke with pt. Nutrition Plan and Nutrition Survey goals reviewed with pt. Pt is following a Heart Healthy diet.  Pt has Type 2 Diabetes. Last A1c indicates blood glucose well-controlled.  Pt checks CBG's 1-2 times a day. Fasting CBG's reportedly 90-100 mg/dL. Post prandial CBGs 140-160 mg/dl.  Per discussion, pt does not use canned/convenience foods often. Pt does not add salt to food. Pt does not eat out frequently. She typically chooses veggies, whole grains, lean meats, and fruits with meals. She reads labels.    Pt expressed understanding of the information reviewed.    Nutrition Diagnosis ? Food-and nutrition-related knowledge deficit related to lack of exposure to information as related to diagnosis of: ? CVD ? Type 2 Diabetes  Nutrition Intervention ? Pt's individual nutrition plan reviewed with pt. ? Benefits of adopting Heart Healthy diet discussed when Medficts reviewed.   ? Continue client-centered nutrition education by RD, as part of interdisciplinary care.  Goal(s) ? Pt to build a healthy plate including vegetables, fruits, whole grains, and low-fat dairy products in a heart healthy meal plan. ? CBG concentrations in the normal range or as close to normal as is safely possible.  Plan:   Will provide client-centered nutrition education as part of interdisciplinary care  Monitor and  evaluate progress toward nutrition goal with team.   Michaele Offer, MS, RDN, LDN

## 2020-06-01 ENCOUNTER — Ambulatory Visit (HOSPITAL_COMMUNITY): Payer: Medicare Other

## 2020-06-01 DIAGNOSIS — D696 Thrombocytopenia, unspecified: Secondary | ICD-10-CM | POA: Diagnosis not present

## 2020-06-03 ENCOUNTER — Ambulatory Visit (HOSPITAL_COMMUNITY): Payer: Medicare Other

## 2020-06-06 ENCOUNTER — Ambulatory Visit (HOSPITAL_COMMUNITY): Payer: Medicare Other

## 2020-06-07 ENCOUNTER — Other Ambulatory Visit: Payer: Self-pay

## 2020-06-07 ENCOUNTER — Other Ambulatory Visit (INDEPENDENT_AMBULATORY_CARE_PROVIDER_SITE_OTHER): Payer: Medicare Other

## 2020-06-07 DIAGNOSIS — E1159 Type 2 diabetes mellitus with other circulatory complications: Secondary | ICD-10-CM

## 2020-06-07 LAB — BASIC METABOLIC PANEL
BUN: 19 mg/dL (ref 6–23)
CO2: 29 mEq/L (ref 19–32)
Calcium: 10.5 mg/dL (ref 8.4–10.5)
Chloride: 103 mEq/L (ref 96–112)
Creatinine, Ser: 0.87 mg/dL (ref 0.40–1.20)
GFR: 62.93 mL/min (ref >60.00)
Glucose, Bld: 112 mg/dL — ABNORMAL HIGH (ref 70–99)
Potassium: 4.4 mEq/L (ref 3.5–5.1)
Sodium: 140 mEq/L (ref 135–145)

## 2020-06-07 LAB — HEMOGLOBIN A1C: Hgb A1c MFr Bld: 6.8 % — ABNORMAL HIGH (ref 4.6–6.5)

## 2020-06-08 ENCOUNTER — Ambulatory Visit (HOSPITAL_COMMUNITY): Payer: Medicare Other

## 2020-06-09 ENCOUNTER — Ambulatory Visit (HOSPITAL_COMMUNITY)
Admission: RE | Admit: 2020-06-09 | Discharge: 2020-06-09 | Disposition: A | Discharge status: Home / Self Care | Payer: Medicare Other | Source: Ambulatory Visit | Attending: Internal Medicine | Admitting: Internal Medicine

## 2020-06-09 ENCOUNTER — Ambulatory Visit (HOSPITAL_BASED_OUTPATIENT_CLINIC_OR_DEPARTMENT_OTHER)
Admission: RE | Admit: 2020-06-09 | Discharge: 2020-06-09 | Disposition: A | Discharge status: Home / Self Care | Payer: Medicare Other | Source: Ambulatory Visit | Attending: Cardiovascular Disease | Admitting: Cardiovascular Disease

## 2020-06-09 ENCOUNTER — Other Ambulatory Visit: Payer: Self-pay

## 2020-06-09 DIAGNOSIS — I739 Peripheral vascular disease, unspecified: Secondary | ICD-10-CM | POA: Diagnosis not present

## 2020-06-10 ENCOUNTER — Encounter: Payer: Self-pay | Admitting: Endocrinology

## 2020-06-10 ENCOUNTER — Ambulatory Visit (INDEPENDENT_AMBULATORY_CARE_PROVIDER_SITE_OTHER): Payer: Medicare Other | Admitting: Endocrinology

## 2020-06-10 ENCOUNTER — Ambulatory Visit (HOSPITAL_COMMUNITY): Payer: Medicare Other

## 2020-06-10 VITALS — BP 142/70 | HR 90 | Ht 64.5 in | Wt 143.0 lb

## 2020-06-10 DIAGNOSIS — E1165 Type 2 diabetes mellitus with hyperglycemia: Secondary | ICD-10-CM | POA: Diagnosis not present

## 2020-06-10 DIAGNOSIS — I6523 Occlusion and stenosis of bilateral carotid arteries: Secondary | ICD-10-CM | POA: Diagnosis not present

## 2020-06-10 NOTE — Progress Notes (Signed)
Patient ID: [name], female   DOB: 06/07/1941, 79 y.o.   MRN: [id number]          Reason for Appointment: Follow-up for endocrinology problems   History of Present Illness:          Date of diagnosis of type 2 diabetes mellitus: 1984       Background history:   She has been on metformin since about the time of her diagnosis and has been on other drugs also but has not had many changes in her regimen in the last several years She thinks her blood sugar used to be fairly well controlled At some point she was given By[place] for about a year and a half which improved her control but this was subsequently start because of it being too expensive for her.  She had no side effects with this  Recent history:   Her A1c is about the same at 6.8 compared to 6.9, lowest 6%   Non-insulin hypoglycemic regimen: Glipizide ER 5 mg daily, Invokana 100 mg daily,   metformin 1 g twice daily  Current management, blood sugar patterns and problems identified:   She is checking blood sugars only in the morning and forgets to do them after meals as discussed on previous visits  However blood sugars are fairly consistent and averaging only about 120  She is gradually gaining weight but she also previously was hospitalized for coronary bypass  Is she is trying to do a little walking limited by her foot conditions, not able to attend cardiac rehab  She thinks that she is generally careful with her meal planning and diet  No hypoglycemic symptoms with glipizide  Also renal function consistently normal        Side effects from medications have been: None  Compliance with the medical regimen: Good Hypoglycemia:   None  Glucose monitoring:  About 1 times a day         Glucometer: One Touch.       Blood Glucose readings   AVERAGE 120 with recent range 99-152  Previous readings  PRE-MEAL Fasting Lunch Dinner Bedtime Overall  Glucose range:  97-167      Mean/median:     126     Self-care:  The diet that the patient has been following is: tries to limit sweets and fried food.     Meal times are:  Breakfast is at 8:30 AM lunch: 1 PM, dinner: 6:30 PM  Typical meal intake: Breakfast is yogurt with fruit or egg/toast or oatmeal Trying to get some boost with protein  Lunch is usually soup with crackers, dinner is usually a low-fat meat with vegetables Snacks will be celery and ranch dressing               Dietician visit, most recent: 10/18                Weight history:  Wt Readings from Last 3 Encounters:  06/10/20 143 lb (64.9 kg)  05/24/20 140 lb 6.9 oz (63.7 kg)  04/25/20 138 lb (62.6 kg)    Glycemic control:   Lab Results  Component Value Date   HGBA1C 6.8 (H) 06/07/2020   HGBA1C 6.9 (H) 03/24/2020   HGBA1C 6.0 01/26/2020   Lab Results  Component Value Date   MICROALBUR 2.0 (H) 06/29/2019   LDLCALC 87 03/22/2020   CREATININE 0.87 06/07/2020   Lab Results  Component Value Date   MICRALBCREAT 2.2 06/29/2019    Lab Results  Component Value Date   FRUCTOSAMINE 243 03/18/2019   FRUCTOSAMINE 268 10/21/2018   FRUCTOSAMINE 267 05/30/2018      Allergies as of 06/10/2020      Reactions   Bactrim [sulfamethoxazole-trimethoprim] Other (See Comments)   ^ K+( elevated)    Demerol [meperidine]    Delusional    Scopolamine    Delusional    Versed [midazolam] Anxiety   Frantic, out of my mind, agitated       Medication List       Accurate as of June 10, 2020 10:04 AM. If you have any questions, ask your nurse or doctor.        STOP taking these medications   amiodarone 200 MG tablet Commonly known as: PACERONE Stopped by: Elayne Snare, MD   colchicine 0.6 MG tablet Stopped by: Elayne Snare, MD   ferrous [id number] C-folic acid capsule Commonly known as: TRINSICON / FOLTRIN Stopped by: Elayne Snare, MD     TAKE these medications   acetaminophen 500 MG tablet Commonly known as: TYLENOL Take 1,000 mg by mouth every 6 (six) hours as  needed for moderate pain or headache.   aspirin EC 81 MG tablet Take 81 mg by mouth every evening.   atorvastatin 40 MG tablet Commonly known as: LIPITOR Take 1 tablet (40 mg total) by mouth daily.   calcium carbonate 500 MG chewable tablet Commonly known as: TUMS - dosed in mg elemental calcium Chew 2 tablets by mouth daily as needed for indigestion or heartburn.   CINNAMON PO Take 1,000 mg by mouth in the morning, at noon, and at bedtime.   clopidogrel 75 MG tablet Commonly known as: PLAVIX Take 1 tablet by mouth once daily   [employer] SENIOR PROBIOTIC PO Take 1 capsule by mouth daily with breakfast.   glipiZIDE 5 MG 24 hr tablet Commonly known as: GLUCOTROL XL Take 1 tablet by mouth once daily with breakfast   hydrochlorothiazide 12.5 MG tablet Commonly known as: HYDRODIURIL Take 1 tablet (12.5 mg total) by mouth daily.   Invokana 100 MG Tabs tablet Generic drug: canagliflozin TAKE 1 TABLET BY MOUTH BEFORE BREAKFAST   metFORMIN 1000 MG tablet Commonly known as: GLUCOPHAGE Take 1 tablet (1,000 mg total) by mouth 2 (two) times daily.   metoprolol tartrate 25 MG tablet Commonly known as: LOPRESSOR Take 1.5 tablets (37.5 mg total) by mouth 2 (two) times daily.   Ozempic (0.25 or 0.5 MG/DOSE) 2 MG/1.5ML Sopn Generic drug: Semaglutide(0.25 or 0.5MG /DOS) INJECT 0.5 MG INTO THE SKIN ONCE A WEEK   pantoprazole 40 MG tablet Commonly known as: PROTONIX Take 1 tablet (40 mg total) by mouth daily. What changed: when to take this   pyridoxine 100 MG tablet Commonly known as: B-6 Take 100 mg by mouth daily.   SYSTANE OP Place 1 drop into both eyes 2 (two) times daily as needed (Dry eyes).   telmisartan 80 MG tablet Commonly known as: MICARDIS Take 1 tablet by mouth once daily   vitamin B-12 500 MCG tablet Commonly known as: CYANOCOBALAMIN Take 500 mcg by mouth daily.   vitamin C 100 MG tablet Take 100 mg by mouth daily.       Allergies:  Allergies  Allergen  Reactions   Bactrim [Sulfamethoxazole-Trimethoprim] Other (See Comments)    ^ K+( elevated)    Demerol [Meperidine]     Delusional    Scopolamine     Delusional    Versed [Midazolam] Anxiety    Frantic, out of my mind,  agitated     Past Medical History:  Diagnosis Date   Arthritis    Cancer ([facility])    removal from nose - MOSE procedure    Complication of anesthesia    VERSED- agitated, muscle spasms, "jerking" , frantic , (never had this occurence in the pas)    Diabetes mellitus without complication ([place])    GERD (gastroesophageal reflux disease)    Heart murmur    History of hiatal hernia    Hyperlipidemia    Hypertension 11/20/11   ECHO- EF>55% Borderline concentric left ventricular hypertrophy. There is a small calcified mass in the L:A near the LA appendage. No valvular masses seen with associated mitral annular calcification. LA Volume/ BSA27.4 ml/m2 No AS. Right ventricular systolic pressure is elevated at 18mmHg.   Peripheral vascular disease ([place])    Pneumonia    not hosp.    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  PTCAs with drug coated balloon Lt ant tibial artery and Lt popliteal artery  11/26/2019    Past Surgical History:  Procedure Laterality Date   ABDOMINAL AORTAGRAM  11/25/2019   ABDOMINAL AORTOGRAM W/LOWER EXTREMITY    ABDOMINAL AORTOGRAM N/A 06/15/2019   Procedure: ABDOMINAL AORTOGRAM;  Surgeon: [name], MD;  Location: [facility];  Service: Cardiovascular;  Laterality: N/A;   ABDOMINAL AORTOGRAM W/LOWER EXTREMITY N/A 11/25/2019   Procedure: ABDOMINAL AORTOGRAM W/LOWER EXTREMITY;  Surgeon: [name], MD;  Location: [name];  Service: Cardiovascular;  Laterality: N/A;  Lt leg   APPENDECTOMY     APPLICATION OF WOUND VAC Right 07/21/2019   Procedure: Application Of [place]a Wound Vac Right Groin;  Surgeon: [name], MD;  Location: [facility];  Service: Vascular;  Laterality: Right;   CARDIAC  CATHETERIZATION     CARPAL [place]     x 2   COLONOSCOPY     CORONARY ARTERY BYPASS GRAFT N/A 03/24/2020   Procedure: CORONARY ARTERY BYPASS GRAFTING (CABG) using LIMA to LAD (m); RIMA to RAMUS; Endoscopic Right Greater Saphenous Vein: SVG to Diag1; SVG to PLB (right); and SVG to PL (left).;  Surgeon: [name], MD;  Location: [facility];  Service: Open Heart Surgery;  Laterality: N/A;  BILATERAL IMA   ENDARTERECTOMY FEMORAL Right 07/21/2019   Procedure: RIGHT ENDARTERECTOMY FEMORAL WITH PATCH ANGIOPLASTY;  Surgeon: [name], MD;  Location: [facility];  Service: Vascular;  Laterality: Right;   ENDOVEIN HARVEST OF GREATER SAPHENOUS VEIN Right 03/24/2020   Procedure: ENDOVEIN HARVEST OF [place];  Surgeon: [name], MD;  Location: [facility];  Service: Open Heart Surgery;  Laterality: Right;   INTRAVASCULAR LITHOTRIPSY  11/25/2019   Procedure: INTRAVASCULAR LITHOTRIPSY;  Surgeon: [name], MD;  Location: [facility];  Service: Cardiovascular;;  LT. SFA   LEFT HEART CATH AND CORONARY ANGIOGRAPHY N/A 03/22/2020   Procedure: LEFT HEART CATH AND CORONARY ANGIOGRAPHY;  Surgeon: Martinique, Peter M, MD;  Location: [facility];  Service: Cardiovascular;  Laterality: N/A;   LOWER EXTREMITY ANGIOGRAPHY Bilateral 06/15/2019   Procedure: Lower Extremity Angiography;  Surgeon: [name], MD;  Location: [facility];  Service: Cardiovascular;  Laterality: Bilateral;   LOWER EXTREMITY ANGIOGRAPHY Right 08/03/2019   Procedure: LOWER EXTREMITY ANGIOGRAPHY;  Surgeon: [name], MD;  Location: [facility];  Service: Cardiovascular;  Laterality: Right;   PATCH  ANGIOPLASTY Right 07/21/2019   Procedure: Patch Angioplasty Right Femoral Artery;  Surgeon: [name], MD;  Location: [facility];  Service: Vascular;  Laterality: Right;   PERIPHERAL VASCULAR ATHERECTOMY  08/03/2019   Procedure:  PERIPHERAL VASCULAR ATHERECTOMY;  Surgeon: [name], MD;  Location: [facility];  Service: Cardiovascular;;  right SFA, right TP trunk   PERIPHERAL VASCULAR ATHERECTOMY  11/25/2019   Procedure: PERIPHERAL VASCULAR ATHERECTOMY;  Surgeon: [name], MD;  Location: [place] [facility];  Service: Cardiovascular;;  Lt.  POPLITEAL and AT   PERIPHERAL VASCULAR BALLOON ANGIOPLASTY Left 06/15/2019   Procedure: PERIPHERAL VASCULAR BALLOON ANGIOPLASTY;  Surgeon: [name], MD;  Location: [facility];  Service: Cardiovascular;  Laterality: Left;  SFA UNSUCCESSFUL UNABLE TO CROSS LESION   PERIPHERAL VASCULAR BALLOON ANGIOPLASTY  08/03/2019   Procedure: PERIPHERAL VASCULAR BALLOON ANGIOPLASTY;  Surgeon: [name], MD;  Location: [facility];  Service: Cardiovascular;;  right SFA, Right TP trunk   TEE WITHOUT CARDIOVERSION N/A 03/24/2020   Procedure: TRANSESOPHAGEAL ECHOCARDIOGRAM (TEE);  Surgeon: [name], MD;  Location: [facility];  Service: Open Heart Surgery;  Laterality: N/A;   TONSILLECTOMY     and adenoidectomy   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 She has never used smokeless tobacco. She reports that she does not drink alcohol and does not use drugs.   Review of Systems   Lipid history: Hypercholesterolemia by PCP with 20 mg Lipitor    Lab Results  Component Value Date   CHOL 154 03/22/2020   HDL 44 03/22/2020   LDLCALC 87 03/22/2020   TRIG 114 03/22/2020   CHOLHDL 3.5 03/22/2020           Hypertension:  Present for several years, currently treated with  Micardis 40 mg, metoprolol, diltiazem and HCTZ, also followed by cardiologist Micardis was reduced in 06/2019 because of low normal blood pressure readings  She says her blood pressure at home is about 423 systolic recently and usually stable  Also on 100 mg Invokana, renal function stable  BP Readings from Last 3 Encounters:  06/10/20 (!) 142/70  05/24/20 122/72  04/25/20 109/63   Lab Results  Component Value Date   CREATININE 0.87 06/07/2020   CREATININE 0.94 04/06/2020   CREATININE 0.89 03/29/2020     Most recent eye exam was in 4/21  Most recent foot exam: At wound care center  She has had significant PAD and followed by vascular cardiologist   LABS:  Lab on 06/07/2020  Component Date Value Ref Range Status   Sodium 06/07/2020 140  135 - 145 mEq/L Final   Potassium 06/07/2020 4.4  3.5 - 5.1 mEq/L Final   Chloride 06/07/2020 103  96 - 112 mEq/L Final   CO2 06/07/2020 29  19 - 32 mEq/L Final   Glucose, Bld 06/07/2020 112* 70 - 99 mg/dL Final   BUN 06/07/2020 19  6 - 23 mg/dL Final   Creatinine, Ser 06/07/2020 0.87  0.40 - 1.20 mg/dL Final   GFR 06/07/2020 62.93  >60.00 mL/min Final   Calcium 06/07/2020 10.5  8.4 - 10.5 mg/dL Final   Hgb A1c MFr Bld 06/07/2020 6.8* 4.6 - 6.5 % Final   Glycemic Control Guidelines for People with Diabetes:Non Diabetic:  <6%Goal of Therapy: <7%Additional Action Suggested:  >8%     Physical Examination:  BP (!) 142/70    Pulse 90    Ht 5' 4.5" (1.638 m)    Wt 143 lb (64.9 kg)    SpO2 99%    BMI 24.17 kg/m       ASSESSMENT:  Diabetes type 2  See history of present illness for detailed discussion of current diabetes management, blood sugar patterns and problems identified  Her A1c is stable at 6.8  A1c is optimal for her considering her age and concomitant conditions  Blood sugars at home are being checked only in the morning and averaging recently 120 No  hypoglycemia with glipizide However can do better with monitoring blood sugars at different times of the day instead of just in the morning Doing well with Invokana; her insurance does not cover [place] or [place]  Hypertension: Blood pressure is well controlled with above regimen  Lipids: She is followed annually at cardiology clinic  PLAN:    For optimal cardiovascular benefits she will go up to 300 mg on Invokana Continue glipizide and Metformin unchanged She will monitor her blood pressure regularly with this change and let us know if there is any drop below 120 consistently  Blood sugars will need to be monitored alternating morning and after meals especially dinner Explained blood sugar targets before and after meals      There are no Patient Instructions on file for this visit.   Elayne Snare 06/10/2020, 10:04 AM   Note: This office note was prepared with Dragon voice recognition system technology. Any transcriptional errors that result from this process are unintentional.

## 2020-06-10 NOTE — Patient Instructions (Addendum)
Invokana 2 pills in am  Check blood sugars on waking up 2  days a week  Also check blood sugars about 2 hours after meals and do this after different meals by rotation  Recommended blood sugar levels on waking up are 90-130 and about 2 hours after meal is 130-160  Please bring your blood sugar monitor to each visit, thank you

## 2020-06-13 ENCOUNTER — Ambulatory Visit (HOSPITAL_COMMUNITY): Payer: Medicare Other

## 2020-06-14 ENCOUNTER — Encounter: Payer: Self-pay | Admitting: Cardiovascular Disease

## 2020-06-14 ENCOUNTER — Other Ambulatory Visit: Payer: Self-pay

## 2020-06-14 ENCOUNTER — Ambulatory Visit (INDEPENDENT_AMBULATORY_CARE_PROVIDER_SITE_OTHER): Payer: Medicare Other | Admitting: Cardiovascular Disease

## 2020-06-14 VITALS — BP 140/66 | HR 82 | Ht 64.5 in | Wt 144.6 lb

## 2020-06-14 DIAGNOSIS — E785 Hyperlipidemia, unspecified: Secondary | ICD-10-CM

## 2020-06-14 DIAGNOSIS — I739 Peripheral vascular disease, unspecified: Secondary | ICD-10-CM

## 2020-06-14 DIAGNOSIS — I6529 Occlusion and stenosis of unspecified carotid artery: Secondary | ICD-10-CM

## 2020-06-14 DIAGNOSIS — Z951 Presence of aortocoronary bypass graft: Secondary | ICD-10-CM | POA: Diagnosis not present

## 2020-06-14 DIAGNOSIS — I6523 Occlusion and stenosis of bilateral carotid arteries: Secondary | ICD-10-CM

## 2020-06-14 DIAGNOSIS — I10 Essential (primary) hypertension: Secondary | ICD-10-CM

## 2020-06-14 NOTE — Progress Notes (Signed)
06/14/2020 [name]   [birth year]  [id number]  Primary Physician [name], MD Primary Cardiologist: [name] MD [name], [place]  HPI:  [name] is a 79 y.o. mildly overweight widowed Caucasian female mother of 93, grandmother 7 grandchildren referred by Dr. Jacqualyn Posey, her podiatrist for critical limb ischemia.I last saw her in the office  11/17/2019. She has a history of treated hypertension, diabetes and hyperlipidemia. She is never had a heart attack or stroke. She denies chest pain or shortness of breath. 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 She has some local trauma related to socks over Christmas and has developed ischemic appearing wounds on her right second and third toes with recent Dopplers performed 10/09/2018 revealing a right ABI 0.75, left 2.79 with moderately severe right SFA stenosis and an occluded right posterior tibial artery. She is seeing Dr. Jacqualyn Posey back regularly for aggressive local wound care.  Herwounds continue to heal on her right and left second toes. These sores are probably a result of local trauma. Recent ABIs are in the0.6range with a high-grade right SFA stenosis and an occluded left popliteal with tibial vessel disease as well. She does complain of some claudication.She wishes to proceed with endovascular therapy for wound healing and lifestyle limiting claudication.  I performed angiography on her 06/15/2019 with a failed attempt at crossing the left SFA CTO. She has one-vessel runoff bilaterally. She did develop gangrenous right first and second toes. 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 Trula Slade who performed right common femoral endarterectomy with patch angioplasty 07/21/2019 with excellent result. She did see Dr. Jacqualyn Posey back in the office late  last week who thought her wounds were stable. The pain in her feet has improved since her endarterectomy. The plan is to perform orbital atherectomy and PTA of her calcified right SFA as well as intervention on her right tibioperoneal trunk prior to right TMA by Dr. Earleen Newport for limb salvage.  I performed orbital atherectomy followed by drug-coated balloon angioplasty of her right SFA, popliteal and tibioperoneal trunk on 08/03/2019 with excellent result. Her follow-up Dopplers performed 08/14/2019 revealed a widely patent SFA and tibioperoneal trunk. 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 Jacqualyn Posey on 08/07/2019 which almost completely healed with the assistance of hyperbaric oxygen.  She has been seeing Dr. Dellia Nims in the wound care clinic on a weekly basis.  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  She is on 2 IV antibiotics and continues to get hyperbaric oxygen.  Dr. Dellia Nims does not feel these will heal without inline flow.  The patient is a candidate for tibial pedal access of the left dorsalis pedis.  I reviewed her anatomy with Dr. Fletcher Anon who i performed tibial pedal intervention in March of this year resulting healing of her left foot wound. She did undergo right TMA by Dr. Jacqualyn Posey which healed nicely.  She was admitted with non-STEMI on 03/13/2020 and underwent cardiac catheterization by Dr. Martinique revealing three-vessel disease. 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 She is recuperated nicely. She does have a small nonhealing wound and a vein harvest site in her right calf. She denies claudication. Recent Dopplers performed 06/08/2020 did reveal an occluded right  SFA although she is asymptomatic from this.  Current Meds  Medication Sig  . acetaminophen (TYLENOL) 500 MG tablet Take 1,000 mg by mouth every 6 (six) hours as needed for moderate pain or  headache.  . Ascorbic Acid (VITAMIN C) 100 MG tablet Take 100 mg by mouth daily.  Marland Kitchen aspirin EC 81 MG tablet Take 81 mg by mouth every evening.  Marland Kitchen atorvastatin (LIPITOR) 40 MG tablet Take 1 tablet (40 mg total) by mouth daily.  . calcium carbonate (TUMS - DOSED IN MG ELEMENTAL CALCIUM) 500 MG chewable tablet Chew 2 tablets by mouth daily as needed for indigestion or heartburn.  Marland Kitchen CINNAMON PO Take 1,000 mg by mouth in the morning, at noon, and at bedtime.   . clopidogrel (PLAVIX) 75 MG tablet Take 1 tablet by mouth once daily (Patient taking differently: Take 75 mg by mouth daily. )  . glipiZIDE (GLUCOTROL XL) 5 MG 24 hr tablet Take 1 tablet by mouth once daily with breakfast  . hydrochlorothiazide (HYDRODIURIL) 12.5 MG tablet Take 1 tablet (12.5 mg total) by mouth daily.  . INVOKANA 100 MG TABS tablet TAKE 1 TABLET BY MOUTH BEFORE BREAKFAST  . metFORMIN (GLUCOPHAGE) 1000 MG tablet Take 1 tablet (1,000 mg total) by mouth 2 (two) times daily.  . metoprolol tartrate (LOPRESSOR) 25 MG tablet Take 1.5 tablets (37.5 mg total) by mouth 2 (two) times daily.  . pantoprazole (PROTONIX) 40 MG tablet Take 1 tablet (40 mg total) by mouth daily. (Patient taking differently: Take 40 mg by mouth every Monday, Wednesday, and Friday at 8 PM. )  . Polyethyl Glycol-Propyl Glycol (SYSTANE OP) Place 1 drop into both eyes 2 (two) times daily as needed (Dry eyes).  . Probiotic Product ([employer] SENIOR PROBIOTIC PO) Take 1 capsule by mouth daily with breakfast.  . pyridoxine (B-6) 100 MG tablet Take 100 mg by mouth daily.  Marland Kitchen telmisartan (MICARDIS) 80 MG tablet Take 1 tablet by mouth once daily  . vitamin B-12 (CYANOCOBALAMIN) 500 MCG tablet Take 500 mcg by mouth daily.     Allergies  Allergen Reactions  . Bactrim [Sulfamethoxazole-Trimethoprim] Other (See Comments)    ^ K+( elevated)   . Demerol [Meperidine]     Delusional   . Scopolamine     Delusional   . Versed [Midazolam] Anxiety    Frantic, out of my mind,  agitated     Social History   Socioeconomic History  . Marital status: Widowed    Spouse name: Not on file  . Number of children: Not on file  . Years of education: Not on file  . Highest education level: Not on file  Occupational History  . Not on file  Tobacco Use  . Smoking status: Former Smoker    Quit date: 03/31/1972    Years since quitting: 48.2  . Smokeless tobacco: Never Used  Vaping Use  . Vaping Use: Never used  Substance and Sexual Activity  . Alcohol use: No  . Drug use: No  . Sexual activity: Not on file  Other Topics Concern  . Not on file  Social History Narrative  . Not on file   Social Determinants of Health   Financial Resource Strain:   . Difficulty of Paying Living Expenses: Not on file  Food Insecurity:   . Worried About Charity fundraiser in the Last Year: Not on file  . Ran Out of Food in the Last Year: Not on file  Transportation Needs:   . Lack of  Transportation (Medical): Not on file  . Lack of Transportation (Non-Medical): Not on file  Physical Activity:   . Days of Exercise per Week: Not on file  . Minutes of Exercise per Session: Not on file  Stress:   . Feeling of Stress : Not on file  Social Connections:   . Frequency of Communication with Friends and Family: Not on file  . Frequency of Social Gatherings with Friends and Family: Not on file  . Attends Religious Services: Not on file  . Active Member of Clubs or Organizations: Not on file  . Attends Archivist Meetings: Not on file  . Marital Status: Not on file  Intimate Partner Violence:   . Fear of Current or Ex-Partner: Not on file  . Emotionally Abused: Not on file  . Physically Abused: Not on file  . Sexually Abused: Not on file     Review of Systems: General: negative for chills, fever, night sweats or weight changes.  Cardiovascular: negative for chest pain, dyspnea on exertion, edema, orthopnea, palpitations, paroxysmal nocturnal dyspnea or shortness of  breath Dermatological: negative for rash Respiratory: negative for cough or wheezing Urologic: negative for hematuria Abdominal: negative for nausea, vomiting, diarrhea, bright red blood per rectum, melena, or hematemesis Neurologic: negative for visual changes, syncope, or dizziness All other systems reviewed and are otherwise negative except as noted above.    Blood pressure 140/66, pulse 82, height 5' 4.5" (1.638 m), weight 144 lb 9.6 oz (65.6 kg), SpO2 98 %.  General appearance: alert and no distress Neck: no adenopathy, no carotid bruit, no JVD, supple, symmetrical, trachea midline and thyroid not enlarged, symmetric, no tenderness/mass/nodules Lungs: clear to auscultation bilaterally Heart: regular rate and rhythm, S1, S2 normal, no murmur, click, rub or gallop Extremities: extremities normal, atraumatic, no cyanosis or edema Pulses: Diminished right pedal pulse Skin: Small nonhealing wound right calf at vein harvest site with some surrounding erythema Neurologic: Alert and oriented X 3, normal strength and tone. Normal symmetric reflexes. 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 I personally reviewed this EKG.  ASSESSMENT AND PLAN:   Essential hypertension History of essential hypertension a blood pressure measured today 140/66. She is on hydrochlorothiazide ,metoprolol., And Micardis.  Dyslipidemia History of dyslipidemia on atorvastatin 40 mg a day with lipid profile performed 03/22/2020 revealing total cholesterol 154, LDL of 87 and HDL 44, not at goal for secondary prevention. We will recheck a fasting lipid liver profile.  Carotid artery disease History of moderate right ICA stenosis by duplex ultrasound performed 03/23/2020. We will repeat this in 1 year.  PAD (peripheral artery disease) (HCC) History of PAD with critical limb ischemia status post right SFA and popliteal  intervention by myself 08/03/2019 and left SFA intervention by Dr. Fletcher Anon March of this year for nonhealing wounds. Her wounds have now healed. She has had a right TMA by Dr. Jacqualyn Posey which is healed beautifully. Recent lower extremity arterial Doppler studies performed 06/09/2020 showed an occluded mid to distal right SFA although the patient denies claudication. At this point, we will not recommend revascularization.  S/P CABG x 5 History of non-STEMI with cardiac catheterization performed by Dr. Martinique revealing three-vessel disease. She ultimately underwent CABG x5 by Dr. Orvan Seen 03/24/2020 and was discharged home a week later. She is done well post discharge. Her wounds of healed nicely. She denies chest pain or shortness of breath. She is participating cardiac  rehab via an APP at home.      [name] MD FACP,FACC,FAHA, [facility] 06/14/2020 12:07 PM

## 2020-06-14 NOTE — Assessment & Plan Note (Signed)
History of non-STEMI with cardiac catheterization performed by Dr. Martinique revealing three-vessel disease. She ultimately underwent CABG x5 by Dr. Orvan Seen 03/24/2020 and was discharged home a week later. She is done well post discharge. Her wounds of healed nicely. She denies chest pain or shortness of breath. She is participating cardiac rehab via an APP at home.

## 2020-06-14 NOTE — Assessment & Plan Note (Signed)
History of PAD with critical limb ischemia status post right SFA and popliteal intervention by myself 08/03/2019 and left SFA intervention by Dr. Fletcher Anon March of this year for nonhealing wounds. Her wounds have now healed. She has had a right TMA by Dr. Jacqualyn Posey which is healed beautifully. Recent lower extremity arterial Doppler studies performed 06/09/2020 showed an occluded mid to distal right SFA although the patient denies claudication. At this point, we will not recommend revascularization.

## 2020-06-14 NOTE — Assessment & Plan Note (Signed)
History of essential hypertension a blood pressure measured today 140/66. She is on hydrochlorothiazide ,metoprolol., And Micardis.

## 2020-06-14 NOTE — Patient Instructions (Addendum)
Medication Instructions:  No Changes In Medications at this time.  *If you need a refill on your cardiac medications before your next appointment, please call your pharmacy*  Lab Work: Fasting Lipid and Liver blood work- please come back for this.   If you have labs (blood work) drawn today and your tests are completely normal, you will receive your results only by: Marland Kitchen MyChart Message (if you have MyChart) OR . A paper copy in the mail If you have any lab test that is abnormal or we need to change your treatment, we will call you to review the results.  Testing/Procedures: Your physician has requested that you have a carotid duplex in 1 year. This test is an ultrasound of the carotid arteries in your neck. It looks at blood flow through these arteries that supply the brain with blood. Allow one hour for this exam. There are no restrictions or special instructions.  Follow-Up: At [facility], you and your health needs are our priority.  As part of our continuing mission to provide you with exceptional heart care, we have created designated Provider Care Teams.  These Care Teams include your primary Cardiologist (physician) and Advanced Practice Providers (APPs -  Physician Assistants and Nurse Practitioners) who all work together to provide you with the care you need, when you need it.  We recommend signing up for the patient portal called "MyChart".  Sign up information is provided on this After Visit Summary.  MyChart is used to connect with patients for Virtual Visits (Telemedicine).  Patients are able to view lab/test results, encounter notes, upcoming appointments, etc.  Non-urgent messages can be sent to your provider as well.   To learn more about what you can do with MyChart, go to [email or web address].    Your next appointment:   6 month(s)  The format for your next appointment:   In Person  Provider:   [name], MD

## 2020-06-14 NOTE — Assessment & Plan Note (Signed)
History of dyslipidemia on atorvastatin 40 mg a day with lipid profile performed 03/22/2020 revealing total cholesterol 154, LDL of 87 and HDL 44, not at goal for secondary prevention. We will recheck a fasting lipid liver profile.

## 2020-06-14 NOTE — Assessment & Plan Note (Signed)
History of moderate right ICA stenosis by duplex ultrasound performed 03/23/2020. We will repeat this in 1 year.

## 2020-06-15 ENCOUNTER — Ambulatory Visit: Payer: Self-pay | Admitting: Cardiothoracic Surgery

## 2020-06-15 ENCOUNTER — Ambulatory Visit (HOSPITAL_COMMUNITY): Payer: Medicare Other

## 2020-06-15 VITALS — BP 160/75 | HR 85 | Temp 97.8°F | Resp 20 | Ht 64.5 in | Wt 144.0 lb

## 2020-06-15 DIAGNOSIS — I6529 Occlusion and stenosis of unspecified carotid artery: Secondary | ICD-10-CM | POA: Diagnosis not present

## 2020-06-15 DIAGNOSIS — Z951 Presence of aortocoronary bypass graft: Secondary | ICD-10-CM

## 2020-06-15 DIAGNOSIS — I10 Essential (primary) hypertension: Secondary | ICD-10-CM | POA: Diagnosis not present

## 2020-06-15 LAB — HEPATIC FUNCTION PANEL
ALT: 10 IU/L (ref 0–32)
AST: 18 IU/L (ref 0–40)
Albumin: 4.4 g/dL (ref 3.7–4.7)
Alkaline Phosphatase: 64 IU/L (ref 44–121)
Bilirubin Total: 0.2 mg/dL (ref 0.0–1.2)
Bilirubin, Direct: <0.1 mg/dL (ref 0.00–0.40)
Total Protein: 6.6 g/dL (ref 6.0–8.5)

## 2020-06-15 LAB — LIPID PANEL
Chol/HDL Ratio: 3.4 ratio (ref 0.0–4.4)
Cholesterol, Total: 165 mg/dL (ref 100–199)
HDL: 48 mg/dL (ref >39)
LDL Chol Calc (NIH): 94 mg/dL (ref 0–99)
Triglycerides: 132 mg/dL (ref 0–149)
VLDL Cholesterol Cal: 23 mg/dL (ref 5–40)

## 2020-06-15 MED ORDER — CEPHALEXIN 500 MG PO CAPS: 500.0000 mg | ORAL_CAPSULE | Freq: Four times a day (QID) | ORAL | 0 refills | Status: AC

## 2020-06-17 ENCOUNTER — Ambulatory Visit (HOSPITAL_COMMUNITY): Payer: Medicare Other

## 2020-06-17 ENCOUNTER — Other Ambulatory Visit: Payer: Self-pay

## 2020-06-17 DIAGNOSIS — I739 Peripheral vascular disease, unspecified: Secondary | ICD-10-CM

## 2020-06-17 DIAGNOSIS — E785 Hyperlipidemia, unspecified: Secondary | ICD-10-CM

## 2020-06-17 DIAGNOSIS — Z951 Presence of aortocoronary bypass graft: Secondary | ICD-10-CM

## 2020-06-17 MED ORDER — ATORVASTATIN CALCIUM 80 MG PO TABS
80.0000 mg | ORAL_TABLET | Freq: Every day | ORAL | 3 refills | Status: DC
Start: 2020-06-17 — End: 2020-12-20

## 2020-06-20 ENCOUNTER — Ambulatory Visit (HOSPITAL_COMMUNITY): Payer: Medicare Other

## 2020-06-20 NOTE — Progress Notes (Signed)
The patient presents for wound check.  She is status post CABG with saphenous vein graft harvesting.  She has an extensive history of peripheral vascular disease.  She notes slow healing of her saphenous vein graft harvest sites and some erythema in the calf area.  She denies fevers or chills  Physical exam: Erythematous incision about the knee.  There is redness involving the tract created for saphenous vein graft harvesting done endoscopically within the calf.  Mild tenderness and mild fluctuance  Impression slow healing saphenous vein graft harvest site due to severe peripheral vascular disease.  We will treat for cellulitis for 10 days with Keflex.  She is to report any fevers chills or worsening of the wound.  There are no specific measures to take regarding wound care.  Follow-up as needed Aradhana Gin Z. Orvan Seen, [place]

## 2020-06-21 ENCOUNTER — Other Ambulatory Visit: Payer: Self-pay

## 2020-06-21 DIAGNOSIS — I739 Peripheral vascular disease, unspecified: Secondary | ICD-10-CM

## 2020-06-22 ENCOUNTER — Ambulatory Visit (HOSPITAL_COMMUNITY): Payer: Medicare Other

## 2020-06-24 ENCOUNTER — Ambulatory Visit (HOSPITAL_COMMUNITY): Payer: Medicare Other

## 2020-06-27 ENCOUNTER — Ambulatory Visit (HOSPITAL_COMMUNITY): Payer: Medicare Other

## 2020-06-29 ENCOUNTER — Encounter (HOSPITAL_COMMUNITY)
Admission: RE | Admit: 2020-06-29 | Discharge: 2020-06-29 | Disposition: A | Discharge status: Home / Self Care | Payer: Medicare Other | Source: Ambulatory Visit | Attending: Cardiovascular Disease | Admitting: Cardiovascular Disease

## 2020-06-29 ENCOUNTER — Ambulatory Visit (HOSPITAL_COMMUNITY): Payer: Medicare Other

## 2020-06-29 NOTE — Progress Notes (Signed)
Cardiac Rehab: Virtual Visit  Patient participates in the virtual cardiac rehab program via the [employer] app. Called patient to check progress with exercise. Patient is currently logging about 4 minutes of walking most days of the week. Patient states that she's walking more than she logs, but as tolerated due to PAD pain. Patient states that she has a blocked artery in her right leg, and she is unable to walk fast and can only walk a little before she has to stop because of pain. Patient states that she's discussed with Dr. Gwenlyn Found possible intervention as needed. I advised patient that walking is beneficial for patients with blockages in the legs and recommended that she try walking as long as she can then rest until pain subsides and starting back walking as able. Patient states that she doesn't always log activity because she's not always able to check her blood pressure before and after. I advised patient that it's not necessary to log BP with each walk and the important thing is duration and frequency of walking. Patient verbalizes understanding.  Patient states that she has 5 lb. weights that she uses for resistance training and is also doing some chair exercises that she has from physical therapy. Will continue to follow in the app.  [name] Passer, MS, ACSM CEP

## 2020-07-01 ENCOUNTER — Ambulatory Visit (HOSPITAL_COMMUNITY): Payer: Medicare Other

## 2020-07-04 ENCOUNTER — Ambulatory Visit (HOSPITAL_COMMUNITY): Payer: Medicare Other

## 2020-07-06 ENCOUNTER — Telehealth: Payer: Self-pay | Admitting: Endocrinology

## 2020-07-06 ENCOUNTER — Ambulatory Visit (HOSPITAL_COMMUNITY): Payer: Medicare Other

## 2020-07-06 MED ORDER — CANAGLIFLOZIN 300 MG PO TABS: ORAL_TABLET | ORAL | 3 refills | Status: DC

## 2020-07-06 NOTE — Telephone Encounter (Signed)
Please advise on dose. Patient charts shows 100 of the Inovakana and she is requesting 300mg 

## 2020-07-06 NOTE — Telephone Encounter (Signed)
Patient called to request the new RX with new dosage of Invokana 300 mg be sent to: [place], [place] - [address] Phone:  [phone]  Fax:  [phone]

## 2020-07-06 NOTE — Telephone Encounter (Signed)
The dose has been increased to 300 mg

## 2020-07-08 ENCOUNTER — Ambulatory Visit (HOSPITAL_COMMUNITY): Payer: Medicare Other

## 2020-07-11 ENCOUNTER — Ambulatory Visit (HOSPITAL_COMMUNITY): Payer: Medicare Other

## 2020-07-11 DIAGNOSIS — Z0000 Encounter for general adult medical examination without abnormal findings: Secondary | ICD-10-CM | POA: Diagnosis not present

## 2020-07-11 DIAGNOSIS — E11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: Secondary | ICD-10-CM | POA: Diagnosis not present

## 2020-07-11 DIAGNOSIS — M8580 Other specified disorders of bone density and structure, unspecified site: Secondary | ICD-10-CM | POA: Diagnosis not present

## 2020-07-11 DIAGNOSIS — Z89439 Acquired absence of unspecified foot: Secondary | ICD-10-CM | POA: Diagnosis not present

## 2020-07-11 DIAGNOSIS — I10 Essential (primary) hypertension: Secondary | ICD-10-CM | POA: Diagnosis not present

## 2020-07-11 DIAGNOSIS — I739 Peripheral vascular disease, unspecified: Secondary | ICD-10-CM | POA: Diagnosis not present

## 2020-07-11 DIAGNOSIS — E113393 Type 2 diabetes mellitus with moderate nonproliferative diabetic retinopathy without macular edema, bilateral: Secondary | ICD-10-CM | POA: Diagnosis not present

## 2020-07-11 DIAGNOSIS — Z7984 Long term (current) use of oral hypoglycemic drugs: Secondary | ICD-10-CM | POA: Diagnosis not present

## 2020-07-13 ENCOUNTER — Ambulatory Visit (HOSPITAL_COMMUNITY): Payer: Medicare Other

## 2020-07-15 ENCOUNTER — Ambulatory Visit (HOSPITAL_COMMUNITY): Payer: Medicare Other

## 2020-07-18 ENCOUNTER — Ambulatory Visit (HOSPITAL_COMMUNITY): Payer: Medicare Other

## 2020-07-19 ENCOUNTER — Other Ambulatory Visit: Payer: Self-pay | Admitting: Cardiovascular Disease

## 2020-07-19 ENCOUNTER — Other Ambulatory Visit: Payer: Self-pay | Admitting: Endocrinology

## 2020-07-20 ENCOUNTER — Ambulatory Visit (HOSPITAL_COMMUNITY): Payer: Medicare Other

## 2020-07-22 ENCOUNTER — Ambulatory Visit (HOSPITAL_COMMUNITY): Payer: Medicare Other

## 2020-07-22 ENCOUNTER — Other Ambulatory Visit: Payer: Self-pay | Admitting: Family Medicine

## 2020-07-22 DIAGNOSIS — M8580 Other specified disorders of bone density and structure, unspecified site: Secondary | ICD-10-CM

## 2020-07-25 ENCOUNTER — Other Ambulatory Visit: Payer: Self-pay

## 2020-07-25 ENCOUNTER — Ambulatory Visit
Admission: RE | Admit: 2020-07-25 | Discharge: 2020-07-25 | Disposition: A | Discharge status: Home / Self Care | Payer: Medicare Other | Source: Ambulatory Visit | Attending: Family Medicine | Admitting: Family Medicine

## 2020-07-25 DIAGNOSIS — M8580 Other specified disorders of bone density and structure, unspecified site: Secondary | ICD-10-CM

## 2020-07-25 DIAGNOSIS — M85832 Other specified disorders of bone density and structure, left forearm: Secondary | ICD-10-CM | POA: Diagnosis not present

## 2020-07-26 ENCOUNTER — Telehealth: Payer: Self-pay | Admitting: Cardiovascular Disease

## 2020-07-26 NOTE — Telephone Encounter (Signed)
Covid test 11/4 at 10:20 AM.  Labs at [facility] same day. OV with Dr. Gwenlyn Found 11/5 at 8:30 AM  PV angio scheduled for Monday 11/8 at 9:30 AM with Dr. Gwenlyn Found. Patient made aware of covid and lab appt.   Will review instructions at [place] [address] [employer] [place] [address] Dept: [phone] Loc: [place]  07/26/2020  You are scheduled for a Peripheral Angiogram on Monday, November 8 with Dr. [name].  1. Please arrive at the [facility] (Main Entrance A) at [facility]: [address] [place], [place] [address] at 7:30 AM (This time is two hours before your procedure to ensure your preparation). Free valet parking service is available.   Special note: Every effort is made to have your procedure done on time. Please understand that emergencies sometimes delay scheduled procedures.  2. Diet: Do not eat solid foods after midnight.  The patient may have clear liquids until 5am upon the day of the procedure.  3. Labs: You will need to have blood drawn on Thursday, November 4 at [facility]  Open: 8am - 5pm (Lunch 12:30 - 1:30)   Phone: [phone]. You do not need to be fasting.  COVID TEST: Thursday 11/4 at 10:20 AM [address] [place]  4. Medication instructions in preparation for your procedure:   Contrast Allergy: No  Hold Invokana, Glipizide AM of procedure  Do not take Diabetes Med Glucophage (Metformin) on the day of the procedure and HOLD 48 HOURS AFTER THE PROCEDURE.  On the morning of your procedure, take your Plavix/Clopidogrel, aspirin and any morning medicines NOT listed above.  You may use sips of water.  5. Plan for one night stay--bring personal belongings. 6. Bring a current list of your medications and current insurance cards. 7. You MUST have a responsible person to drive you  home. 8. Someone MUST be with you the first 24 hours after you arrive home or your discharge will be delayed. 9. Please wear clothes that are easy to get on and off and wear slip-on shoes.  Thank you for allowing [place] to care for you!   -- [place] [facility]

## 2020-07-26 NOTE — Telephone Encounter (Signed)
Hx of TMA. Noted open wound 3 days ago. No injury. Its been painful and bleeds. Will review with MD.

## 2020-07-26 NOTE — Telephone Encounter (Signed)
Patient called and stated that she really needs to speak with Dr. Gwenlyn Found or nurse. Wound is starting to open up on her foot. Toes were amputated. Talked to Wound Care and they have nothing until December. Patient is scared to lose other part of foot. Please call to discuss with patient.

## 2020-07-26 NOTE — Telephone Encounter (Signed)
Discussed with Dr. Gwenlyn Found.  Patient needs OV Friday, schedule for PV angio Monday 11/8.      Patient aware, appt scheduled to see Dr. Gwenlyn Found Friday at 8:30 AM.     Will schedule PV angio and call patient back.  She is aware

## 2020-07-28 ENCOUNTER — Other Ambulatory Visit: Payer: Self-pay | Admitting: *Deleted

## 2020-07-28 ENCOUNTER — Other Ambulatory Visit (HOSPITAL_COMMUNITY)
Admission: RE | Admit: 2020-07-28 | Discharge: 2020-07-28 | Disposition: A | Discharge status: Home / Self Care | Payer: Medicare Other | Source: Ambulatory Visit | Attending: Cardiovascular Disease | Admitting: Cardiovascular Disease

## 2020-07-28 DIAGNOSIS — Z20822 Contact with and (suspected) exposure to covid-19: Secondary | ICD-10-CM | POA: Insufficient documentation

## 2020-07-28 DIAGNOSIS — I739 Peripheral vascular disease, unspecified: Secondary | ICD-10-CM

## 2020-07-28 DIAGNOSIS — Z01818 Encounter for other preprocedural examination: Secondary | ICD-10-CM | POA: Diagnosis not present

## 2020-07-28 DIAGNOSIS — Z951 Presence of aortocoronary bypass graft: Secondary | ICD-10-CM | POA: Diagnosis not present

## 2020-07-28 DIAGNOSIS — E785 Hyperlipidemia, unspecified: Secondary | ICD-10-CM | POA: Diagnosis not present

## 2020-07-28 LAB — LIPID PANEL
Chol/HDL Ratio: 2.7 ratio (ref 0.0–4.4)
Cholesterol, Total: 119 mg/dL (ref 100–199)
HDL: 44 mg/dL (ref >39)
LDL Chol Calc (NIH): 54 mg/dL (ref 0–99)
Triglycerides: 115 mg/dL (ref 0–149)
VLDL Cholesterol Cal: 21 mg/dL (ref 5–40)

## 2020-07-28 LAB — HEPATIC FUNCTION PANEL
ALT: 18 IU/L (ref 0–32)
AST: 22 IU/L (ref 0–40)
Albumin: 4.2 g/dL (ref 3.7–4.7)
Alkaline Phosphatase: 76 IU/L (ref 44–121)
Bilirubin Total: 0.2 mg/dL (ref 0.0–1.2)
Bilirubin, Direct: <0.1 mg/dL (ref 0.00–0.40)
Total Protein: 6.3 g/dL (ref 6.0–8.5)

## 2020-07-28 LAB — SARS CORONAVIRUS 2 (TAT 6-24 HRS): SARS Coronavirus 2: NEGATIVE

## 2020-07-28 MED ORDER — SODIUM CHLORIDE 0.9% FLUSH: 3.0000 mL | Freq: Two times a day (BID) | INTRAVENOUS | Status: DC

## 2020-07-29 ENCOUNTER — Ambulatory Visit (INDEPENDENT_AMBULATORY_CARE_PROVIDER_SITE_OTHER): Payer: Medicare Other | Admitting: Cardiovascular Disease

## 2020-07-29 ENCOUNTER — Other Ambulatory Visit: Payer: Self-pay

## 2020-07-29 ENCOUNTER — Encounter: Payer: Self-pay | Admitting: Cardiovascular Disease

## 2020-07-29 VITALS — BP 122/62 | HR 92 | Ht 64.5 in | Wt 144.4 lb

## 2020-07-29 DIAGNOSIS — Z95820 Peripheral vascular angioplasty status with implants and grafts: Secondary | ICD-10-CM | POA: Diagnosis not present

## 2020-07-29 DIAGNOSIS — Z01812 Encounter for preprocedural laboratory examination: Secondary | ICD-10-CM | POA: Diagnosis not present

## 2020-07-29 DIAGNOSIS — I6523 Occlusion and stenosis of bilateral carotid arteries: Secondary | ICD-10-CM | POA: Diagnosis not present

## 2020-07-29 LAB — BASIC METABOLIC PANEL
BUN/Creatinine Ratio: 20 (ref 12–28)
BUN: 17 mg/dL (ref 8–27)
CO2: 23 mmol/L (ref 20–29)
Calcium: 10.3 mg/dL (ref 8.7–10.3)
Chloride: 103 mmol/L (ref 96–106)
Creatinine, Ser: 0.86 mg/dL (ref 0.57–1.00)
GFR calc Af Amer: 75 mL/min/{1.73_m2} (ref >59)
GFR calc non Af Amer: 65 mL/min/{1.73_m2} (ref >59)
Glucose: 124 mg/dL — ABNORMAL HIGH (ref 65–99)
Potassium: 4.4 mmol/L (ref 3.5–5.2)
Sodium: 142 mmol/L (ref 134–144)

## 2020-07-29 LAB — CBC
Hematocrit: 37.4 % (ref 34.0–46.6)
Hemoglobin: 12.2 g/dL (ref 11.1–15.9)
MCH: 27.5 pg (ref 26.6–33.0)
MCHC: 32.6 g/dL (ref 31.5–35.7)
MCV: 84 fL (ref 79–97)
Platelets: 304 10*3/uL (ref 150–450)
RBC: 4.44 x10E6/uL (ref 3.77–5.28)
RDW: 14.8 % (ref 11.7–15.4)
WBC: 9.8 10*3/uL (ref 3.4–10.8)

## 2020-07-29 NOTE — Progress Notes (Signed)
07/29/2020 [name]   May 12, 1941  [id number]  Primary Physician [name], MD Primary Cardiologist: [name] MD [name], [place]  HPI:  [name] is a 79 y.o.  mildly overweight widowed Caucasian female mother of 46, grandmother 7 grandchildren referred by Dr. Jacqualyn Posey, her podiatrist for critical limb ischemia.I last saw her in the office 06/14/2020. She has a history of treated hypertension, diabetes and hyperlipidemia. She is never had a heart attack or stroke. She denies chest pain or shortness of breath. 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 She has some local trauma related to socks over Christmas and has developed ischemic appearing wounds on her right second and third toes with recent Dopplers performed 10/09/2018 revealing a right ABI 0.75, left 2.79 with moderately severe right SFA stenosis and an occluded right posterior tibial artery. She is seeing Dr. Jacqualyn Posey back regularly for aggressive local wound care.  Herwounds continue to heal on her right and left second toes. These sores are probably a result of local trauma. Recent ABIs are in the0.6range with a high-grade right SFA stenosis and an occluded left popliteal with tibial vessel disease as well. She does complain of some claudication.She wishes to proceed with endovascular therapy for wound healing and lifestyle limiting claudication.  I performed angiography on her 06/15/2019 with a failed attempt at crossing the left SFA CTO. She has one-vessel runoff bilaterally. She did develop gangrenous right first and second toes. 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 Trula Slade who performed right common femoral endarterectomy with patch angioplasty 07/21/2019 with excellent result. She did see Dr. Jacqualyn Posey back in the office late  last week who thought her wounds were stable. The pain in her feet has improved since her endarterectomy. The plan is to perform orbital atherectomy and PTA of her calcified right SFA as well as intervention on her right tibioperoneal trunk prior to right TMA by Dr. Earleen Newport for limb salvage.  I performed orbital atherectomy followed by drug-coated balloon angioplasty of her right SFA, popliteal and tibioperoneal trunk on 08/03/2019 with excellent result. Her follow-up Dopplers performed 08/14/2019 revealed a widely patent SFA and tibioperoneal trunk. 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 Jacqualyn Posey on 08/07/2019 whichalmost completely healed with the assistance of hyperbaric oxygen.  She has been seeing Dr. Dellia Nims in the wound care clinic on a weekly basis. 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 She is on 2 IV antibiotics and continues to get hyperbaric oxygen. Dr. Dellia Nims does not feel these will heal without inline flow. The patient is a candidate for tibial pedal access of the left dorsalis pedis. I reviewed her anatomy with Dr. Fletcher Anon who i performed tibial pedal intervention in March of this year resulting healing of her left foot wound. She did undergo right TMA by Dr. Jacqualyn Posey which healed nicely.  She was admitted with non-STEMI on 03/13/2020 and underwent cardiac catheterization by Dr. Martinique revealing three-vessel disease. 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 She is recuperated nicely. She does have a small nonhealing wound and a vein harvest site in her right calf. She denies claudication. Recent Dopplers performed 06/08/2020 did reveal an occluded right SFA although she was asymptomatic from  this.  Unfortunately, over the last week she has had breakdown of her right TMA healed wound now with a small open wound that has been draining.  She  does get pain in her leg when she walks.  Based on this I decided to proceed with urgent angiography potential endovascular therapy for limb salvage.   Current Meds  Medication Sig  . acetaminophen (TYLENOL) 500 MG tablet Take 1,000 mg by mouth every 6 (six) hours as needed for moderate pain or headache.  . Ascorbic Acid (VITAMIN C) 100 MG tablet Take 100 mg by mouth daily with supper.   Marland Kitchen aspirin EC 81 MG tablet Take 81 mg by mouth at bedtime.   Marland Kitchen atorvastatin (LIPITOR) 80 MG tablet Take 1 tablet (80 mg total) by mouth daily. (Patient taking differently: Take 80 mg by mouth at bedtime. )  . calcium carbonate (TUMS - DOSED IN MG ELEMENTAL CALCIUM) 500 MG chewable tablet Chew 2 tablets by mouth daily as needed for indigestion or heartburn.   . canagliflozin (INVOKANA) 300 MG TABS tablet TAKE 1 TABLET BY MOUTH BEFORE BREAKFAST (Patient taking differently: Take 300 mg by mouth daily before breakfast. )  . CINNAMON PO Take 2 tablets by mouth in the morning, at noon, and at bedtime.   . clopidogrel (PLAVIX) 75 MG tablet Take 1 tablet by mouth once daily (Patient taking differently: Take 75 mg by mouth daily. )  . glipiZIDE (GLUCOTROL XL) 5 MG 24 hr tablet Take 1 tablet by mouth once daily with breakfast (Patient taking differently: Take 5 mg by mouth daily with breakfast. )  . hydrochlorothiazide (HYDRODIURIL) 12.5 MG tablet Take 1 tablet (12.5 mg total) by mouth daily.  . metFORMIN (GLUCOPHAGE) 1000 MG tablet Take 1 tablet (1,000 mg total) by mouth 2 (two) times daily.  . metoprolol tartrate (LOPRESSOR) 25 MG tablet Take 1.5 tablets (37.5 mg total) by mouth 2 (two) times daily.  . pantoprazole (PROTONIX) 40 MG tablet Take 1 tablet (40 mg total) by mouth daily. (Patient taking differently: Take 40 mg by mouth every Monday, Wednesday, and Friday at 8 PM. )  . Polyethyl Glycol-Propyl Glycol (SYSTANE OP) Place 1 drop into both eyes 2 (two) times daily as needed (Dry eyes).  . Probiotic Product ([employer] SENIOR  PROBIOTIC PO) Take 1 capsule by mouth daily with breakfast.  . pyridoxine (B-6) 100 MG tablet Take 100 mg by mouth every evening.   Marland Kitchen telmisartan (MICARDIS) 80 MG tablet Take 1 tablet by mouth once daily (Patient taking differently: Take 80 mg by mouth daily with breakfast. )  . vitamin B-12 (CYANOCOBALAMIN) 500 MCG tablet Take 500 mcg by mouth daily.   Current Facility-Administered Medications for the 07/29/20 encounter (Office Visit) with [name], MD  Medication  . sodium chloride flush (NS) 0.9 % injection 3 mL     Allergies  Allergen Reactions  . Bactrim [Sulfamethoxazole-Trimethoprim] Other (See Comments)    ^ K+( elevated)   . Demerol [Meperidine]     Delusional   . Scopolamine     Delusional   . Versed [Midazolam] Anxiety    Frantic, out of my mind, agitated     Social History   Socioeconomic History  . Marital status: Widowed    Spouse name: Not on file  . Number of children: Not on file  . Years of education: Not on file  . Highest education level: Not on file  Occupational History  . Not on file  Tobacco Use  . Smoking  status: Former Smoker    Quit date: 03/31/1972    Years since quitting: 48.3  . Smokeless tobacco: Never Used  Vaping Use  . Vaping Use: Never used  Substance and Sexual Activity  . Alcohol use: No  . Drug use: No  . Sexual activity: Not on file  Other Topics Concern  . Not on file  Social History Narrative  . Not on file   Social Determinants of Health   Financial Resource Strain:   . Difficulty of Paying Living Expenses: Not on file  Food Insecurity:   . Worried About Charity fundraiser in the Last Year: Not on file  . Ran Out of Food in the Last Year: Not on file  Transportation Needs:   . Lack of Transportation (Medical): Not on file  . Lack of Transportation (Non-Medical): Not on file  Physical Activity:   . Days of Exercise per Week: Not on file  . Minutes of Exercise per Session: Not on file  Stress:   . Feeling of  Stress : Not on file  Social Connections:   . Frequency of Communication with Friends and Family: Not on file  . Frequency of Social Gatherings with Friends and Family: Not on file  . Attends Religious Services: Not on file  . Active Member of Clubs or Organizations: Not on file  . Attends Archivist Meetings: Not on file  . Marital Status: Not on file  Intimate Partner Violence:   . Fear of Current or Ex-Partner: Not on file  . Emotionally Abused: Not on file  . Physically Abused: Not on file  . Sexually Abused: Not on file     Review of Systems: General: negative for chills, fever, night sweats or weight changes.  Cardiovascular: negative for chest pain, dyspnea on exertion, edema, orthopnea, palpitations, paroxysmal nocturnal dyspnea or shortness of breath Dermatological: negative for rash Respiratory: negative for cough or wheezing Urologic: negative for hematuria Abdominal: negative for nausea, vomiting, diarrhea, bright red blood per rectum, melena, or hematemesis Neurologic: negative for visual changes, syncope, or dizziness All other systems reviewed and are otherwise negative except as noted above.    Blood pressure 122/62, pulse 92, height 5' 4.5" (1.638 m), weight 144 lb 6.4 oz (65.5 kg), SpO2 99 %.  General appearance: alert and no distress Neck: no adenopathy, no JVD, supple, symmetrical, trachea midline, thyroid not enlarged, symmetric, no tenderness/mass/nodules and Bilateral carotid bruits Lungs: clear to auscultation bilaterally Heart: regular rate and rhythm, S1, S2 normal, no murmur, click, rub or gallop Extremities: extremities normal, atraumatic, no cyanosis or edema Pulses: Absent pedal pulses Skin: Skin breakdown right TMA Neurologic: Alert and oriented X 3, normal strength and tone. Normal symmetric reflexes. Normal coordination and gait  EKG not performed today  ASSESSMENT AND PLAN:   S/P angioplasty with stent Lt SFA of prox segment.  PTCAs  with drug coated balloon Lt ant tibial artery and Lt popliteal artery  Ms. Kimmey returns today for evaluation of "critical limb ischemia".  I have intervened on her right SFA and tibioperoneal trunk 08/03/2019 with an excellent result.  She went on to have a transmetatarsal amputation by Dr. Jacqualyn Posey 08/07/2019 which healed.  Apparently in the last week she had wound breakdown.  Dopplers performed 06/09/2020 revealed a right ABI of 0.98 although this was probably unreliable with an occluded right SFA.  I scheduled her for PV angiography and intervention on Monday for limb salvage.      [name]  MD [name], Raynald Kemp 07/29/2020 9:21 AM

## 2020-07-29 NOTE — Patient Instructions (Signed)
[facility] [place] [address] Dept: [phone] Loc: [place]  07/29/2020  You are scheduled for a Peripheral Angiogram on Monday, November 8 with Dr. [name].  1. Please arrive at the [facility] (Main Entrance A) at [facility]: [address] [place], [place] [address] at 7:30 AM (This time is two hours before your procedure to ensure your preparation). Free valet parking service is available.   Special note: Every effort is made to have your procedure done on time. Please understand that emergencies sometimes delay scheduled procedures.  2. Diet: Do not eat solid foods after midnight.  The patient may have clear liquids until 5am upon the day of the procedure.  3. Labs: we will get labs today for your procedure. BMET, CBC  4. Medication instructions in preparation for your procedure:   Contrast Allergy: No   Current Outpatient Medications (Endocrine & Metabolic):  .  canagliflozin (INVOKANA) 300 MG TABS tablet, TAKE 1 TABLET BY MOUTH BEFORE BREAKFAST (Patient taking differently: Take 300 mg by mouth daily before breakfast. ) .  glipiZIDE (GLUCOTROL XL) 5 MG 24 hr tablet, Take 1 tablet by mouth once daily with breakfast (Patient taking differently: Take 5 mg by mouth daily with breakfast. ) .  metFORMIN (GLUCOPHAGE) 1000 MG tablet, Take 1 tablet (1,000 mg total) by mouth 2 (two) times daily.   Current Outpatient Medications (Cardiovascular):  .  atorvastatin (LIPITOR) 80 MG tablet, Take 1 tablet (80 mg total) by mouth daily. (Patient taking differently: Take 80 mg by mouth at bedtime. ) .  hydrochlorothiazide (HYDRODIURIL) 12.5 MG tablet, Take 1 tablet (12.5 mg total) by mouth daily. .  metoprolol tartrate (LOPRESSOR) 25 MG tablet, Take 1.5 tablets (37.5 mg total) by mouth 2 (two) times daily. Marland Kitchen  telmisartan (MICARDIS) 80 MG tablet, Take  1 tablet by mouth once daily (Patient taking differently: Take 80 mg by mouth daily with breakfast. )     Current Outpatient Medications (Analgesics):  .  acetaminophen (TYLENOL) 500 MG tablet, Take 1,000 mg by mouth every 6 (six) hours as needed for moderate pain or headache. Marland Kitchen  aspirin EC 81 MG tablet, Take 81 mg by mouth at bedtime.    Current Outpatient Medications (Hematological):  .  clopidogrel (PLAVIX) 75 MG tablet, Take 1 tablet by mouth once daily (Patient taking differently: Take 75 mg by mouth daily. ) .  vitamin B-12 (CYANOCOBALAMIN) 500 MCG tablet, Take 500 mcg by mouth daily.   Current Outpatient Medications (Other):  Marland Kitchen  Ascorbic Acid (VITAMIN C) 100 MG tablet, Take 100 mg by mouth daily with supper.  .  calcium carbonate (TUMS - DOSED IN MG ELEMENTAL CALCIUM) 500 MG chewable tablet, Chew 2 tablets by mouth daily as needed for indigestion or heartburn.  Marland Kitchen  CINNAMON PO, Take 2 tablets by mouth in the morning, at noon, and at bedtime.  .  pantoprazole (PROTONIX) 40 MG tablet, Take 1 tablet (40 mg total) by mouth daily. (Patient taking differently: Take 40 mg by mouth every Monday, Wednesday, and Friday at 8 PM. ) .  Polyethyl Glycol-Propyl Glycol (SYSTANE OP), Place 1 drop into both eyes 2 (two) times daily as needed (Dry eyes). .  Probiotic Product ([employer] SENIOR PROBIOTIC PO), Take 1 capsule by mouth daily with breakfast. .  pyridoxine (B-6) 100 MG tablet, Take 100 mg by mouth every evening.   Current Facility-Administered Medications (Other):  .  sodium chloride flush (NS) 0.9 % injection 3 mL *For reference purposes while preparing patient instructions.   Delete this med list prior to printing instructions for patient.*  Do not take Diabetes Med Glucophage (Metformin) on the day of the procedure and HOLD 48 HOURS AFTER THE PROCEDURE.  On the morning of your procedure, take your Aspirin and Plavix and any morning medicines NOT listed above.  You may use sips of  water.  5. Plan for one night stay--bring personal belongings. 6. Bring a current list of your medications and current insurance cards. 7. You MUST have a responsible person to drive you home. 8. Someone MUST be with you the first 24 hours after you arrive home or your discharge will be delayed. 9. Please wear clothes that are easy to get on and off and wear slip-on shoes.  Thank you for allowing [place] to care for you!   -- [place] [facility]

## 2020-07-29 NOTE — H&P (View-Only) (Signed)
07/29/2020 [name]   [birth year]  [id number]  Primary Physician [name], MD Primary Cardiologist: [name] MD [name], [place]  HPI:  [name] is a 79 y.o.  mildly overweight widowed Caucasian female mother of 19, grandmother 7 grandchildren referred by Dr. Jacqualyn Posey, her podiatrist for critical limb ischemia.I last saw her in the office 06/14/2020. She has a history of treated hypertension, diabetes and hyperlipidemia. She is never had a heart attack or stroke. She denies chest pain or shortness of breath. 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 She has some local trauma related to socks over Christmas and has developed ischemic appearing wounds on her right second and third toes with recent Dopplers performed 10/09/2018 revealing a right ABI 0.75, left 2.79 with moderately severe right SFA stenosis and an occluded right posterior tibial artery. She is seeing Dr. Jacqualyn Posey back regularly for aggressive local wound care.  Herwounds continue to heal on her right and left second toes. These sores are probably a result of local trauma. Recent ABIs are in the0.6range with a high-grade right SFA stenosis and an occluded left popliteal with tibial vessel disease as well. She does complain of some claudication.She wishes to proceed with endovascular therapy for wound healing and lifestyle limiting claudication.  I performed angiography on her 06/15/2019 with a failed attempt at crossing the left SFA CTO. She has one-vessel runoff bilaterally. She did develop gangrenous right first and second toes. 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 Trula Slade who performed right common femoral endarterectomy with patch angioplasty 07/21/2019 with excellent result. She did see Dr. Jacqualyn Posey back in the office late  last week who thought her wounds were stable. The pain in her feet has improved since her endarterectomy. The plan is to perform orbital atherectomy and PTA of her calcified right SFA as well as intervention on her right tibioperoneal trunk prior to right TMA by Dr. Earleen Newport for limb salvage.  I performed orbital atherectomy followed by drug-coated balloon angioplasty of her right SFA, popliteal and tibioperoneal trunk on 08/03/2019 with excellent result. Her follow-up Dopplers performed 08/14/2019 revealed a widely patent SFA and tibioperoneal trunk. 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 Jacqualyn Posey on 08/07/2019 whichalmost completely healed with the assistance of hyperbaric oxygen.  She has been seeing Dr. Dellia Nims in the wound care clinic on a weekly basis. 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 She is on 2 IV antibiotics and continues to get hyperbaric oxygen. Dr. Dellia Nims does not feel these will heal without inline flow. The patient is a candidate for tibial pedal access of the left dorsalis pedis. I reviewed her anatomy with Dr. Fletcher Anon who i performed tibial pedal intervention in March of this year resulting healing of her left foot wound. She did undergo right TMA by Dr. Jacqualyn Posey which healed nicely.  She was admitted with non-STEMI on 03/13/2020 and underwent cardiac catheterization by Dr. Martinique revealing three-vessel disease. 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 She is recuperated nicely. She does have a small nonhealing wound and a vein harvest site in her right calf. She denies claudication. Recent Dopplers performed 06/08/2020 did reveal an occluded right SFA although she was asymptomatic from  this.  Unfortunately, over the last week she has had breakdown of her right TMA healed wound now with a small open wound that has been draining.  She  does get pain in her leg when she walks.  Based on this I decided to proceed with urgent angiography potential endovascular therapy for limb salvage.   Current Meds  Medication Sig  . acetaminophen (TYLENOL) 500 MG tablet Take 1,000 mg by mouth every 6 (six) hours as needed for moderate pain or headache.  . Ascorbic Acid (VITAMIN C) 100 MG tablet Take 100 mg by mouth daily with supper.   Marland Kitchen aspirin EC 81 MG tablet Take 81 mg by mouth at bedtime.   Marland Kitchen atorvastatin (LIPITOR) 80 MG tablet Take 1 tablet (80 mg total) by mouth daily. (Patient taking differently: Take 80 mg by mouth at bedtime. )  . calcium carbonate (TUMS - DOSED IN MG ELEMENTAL CALCIUM) 500 MG chewable tablet Chew 2 tablets by mouth daily as needed for indigestion or heartburn.   . canagliflozin (INVOKANA) 300 MG TABS tablet TAKE 1 TABLET BY MOUTH BEFORE BREAKFAST (Patient taking differently: Take 300 mg by mouth daily before breakfast. )  . CINNAMON PO Take 2 tablets by mouth in the morning, at noon, and at bedtime.   . clopidogrel (PLAVIX) 75 MG tablet Take 1 tablet by mouth once daily (Patient taking differently: Take 75 mg by mouth daily. )  . glipiZIDE (GLUCOTROL XL) 5 MG 24 hr tablet Take 1 tablet by mouth once daily with breakfast (Patient taking differently: Take 5 mg by mouth daily with breakfast. )  . hydrochlorothiazide (HYDRODIURIL) 12.5 MG tablet Take 1 tablet (12.5 mg total) by mouth daily.  . metFORMIN (GLUCOPHAGE) 1000 MG tablet Take 1 tablet (1,000 mg total) by mouth 2 (two) times daily.  . metoprolol tartrate (LOPRESSOR) 25 MG tablet Take 1.5 tablets (37.5 mg total) by mouth 2 (two) times daily.  . pantoprazole (PROTONIX) 40 MG tablet Take 1 tablet (40 mg total) by mouth daily. (Patient taking differently: Take 40 mg by mouth every Monday, Wednesday, and Friday at 8 PM. )  . Polyethyl Glycol-Propyl Glycol (SYSTANE OP) Place 1 drop into both eyes 2 (two) times daily as needed (Dry eyes).  . Probiotic Product ([employer] SENIOR  PROBIOTIC PO) Take 1 capsule by mouth daily with breakfast.  . pyridoxine (B-6) 100 MG tablet Take 100 mg by mouth every evening.   Marland Kitchen telmisartan (MICARDIS) 80 MG tablet Take 1 tablet by mouth once daily (Patient taking differently: Take 80 mg by mouth daily with breakfast. )  . vitamin B-12 (CYANOCOBALAMIN) 500 MCG tablet Take 500 mcg by mouth daily.   Current Facility-Administered Medications for the 07/29/20 encounter (Office Visit) with [name], MD  Medication  . sodium chloride flush (NS) 0.9 % injection 3 mL     Allergies  Allergen Reactions  . Bactrim [Sulfamethoxazole-Trimethoprim] Other (See Comments)    ^ K+( elevated)   . Demerol [Meperidine]     Delusional   . Scopolamine     Delusional   . Versed [Midazolam] Anxiety    Frantic, out of my mind, agitated     Social History   Socioeconomic History  . Marital status: Widowed    Spouse name: Not on file  . Number of children: Not on file  . Years of education: Not on file  . Highest education level: Not on file  Occupational History  . Not on file  Tobacco Use  . Smoking  status: Former Smoker    Quit date: 03/31/1972    Years since quitting: 48.3  . Smokeless tobacco: Never Used  Vaping Use  . Vaping Use: Never used  Substance and Sexual Activity  . Alcohol use: No  . Drug use: No  . Sexual activity: Not on file  Other Topics Concern  . Not on file  Social History Narrative  . Not on file   Social Determinants of Health   Financial Resource Strain:   . Difficulty of Paying Living Expenses: Not on file  Food Insecurity:   . Worried About Charity fundraiser in the Last Year: Not on file  . Ran Out of Food in the Last Year: Not on file  Transportation Needs:   . Lack of Transportation (Medical): Not on file  . Lack of Transportation (Non-Medical): Not on file  Physical Activity:   . Days of Exercise per Week: Not on file  . Minutes of Exercise per Session: Not on file  Stress:   . Feeling of  Stress : Not on file  Social Connections:   . Frequency of Communication with Friends and Family: Not on file  . Frequency of Social Gatherings with Friends and Family: Not on file  . Attends Religious Services: Not on file  . Active Member of Clubs or Organizations: Not on file  . Attends Archivist Meetings: Not on file  . Marital Status: Not on file  Intimate Partner Violence:   . Fear of Current or Ex-Partner: Not on file  . Emotionally Abused: Not on file  . Physically Abused: Not on file  . Sexually Abused: Not on file     Review of Systems: General: negative for chills, fever, night sweats or weight changes.  Cardiovascular: negative for chest pain, dyspnea on exertion, edema, orthopnea, palpitations, paroxysmal nocturnal dyspnea or shortness of breath Dermatological: negative for rash Respiratory: negative for cough or wheezing Urologic: negative for hematuria Abdominal: negative for nausea, vomiting, diarrhea, bright red blood per rectum, melena, or hematemesis Neurologic: negative for visual changes, syncope, or dizziness All other systems reviewed and are otherwise negative except as noted above.    Blood pressure 122/62, pulse 92, height 5' 4.5" (1.638 m), weight 144 lb 6.4 oz (65.5 kg), SpO2 99 %.  General appearance: alert and no distress Neck: no adenopathy, no JVD, supple, symmetrical, trachea midline, thyroid not enlarged, symmetric, no tenderness/mass/nodules and Bilateral carotid bruits Lungs: clear to auscultation bilaterally Heart: regular rate and rhythm, S1, S2 normal, no murmur, click, rub or gallop Extremities: extremities normal, atraumatic, no cyanosis or edema Pulses: Absent pedal pulses Skin: Skin breakdown right TMA Neurologic: Alert and oriented X 3, normal strength and tone. Normal symmetric reflexes. Normal coordination and gait  EKG not performed today  ASSESSMENT AND PLAN:   S/P angioplasty with stent Lt SFA of prox segment.  PTCAs  with drug coated balloon Lt ant tibial artery and Lt popliteal artery  [name] returns today for evaluation of "critical limb ischemia".  I have intervened on her right SFA and tibioperoneal trunk 08/03/2019 with an excellent result.  She went on to have a transmetatarsal amputation by Dr. Jacqualyn Posey 08/07/2019 which healed.  Apparently in the last week she had wound breakdown.  Dopplers performed 06/09/2020 revealed a right ABI of 0.98 although this was probably unreliable with an occluded right SFA.  I scheduled her for PV angiography and intervention on Monday for limb salvage.      [name]  MD [name], Raynald Kemp 07/29/2020 9:21 AM

## 2020-07-29 NOTE — Assessment & Plan Note (Signed)
[name] returns today for evaluation of "critical limb ischemia".  I have intervened on her right SFA and tibioperoneal trunk 08/03/2019 with an excellent result.  She went on to have a transmetatarsal amputation by Dr. Jacqualyn Posey 08/07/2019 which healed.  Apparently in the last week she had wound breakdown.  Dopplers performed 06/09/2020 revealed a right ABI of 0.98 although this was probably unreliable with an occluded right SFA.  I scheduled her for PV angiography and intervention on Monday for limb salvage.

## 2020-07-31 ENCOUNTER — Telehealth: Payer: Self-pay | Admitting: *Deleted

## 2020-07-31 NOTE — Telephone Encounter (Addendum)
Call placed to patient to review medication instructions for procedure 08/01/20:  Hold: Metformin-day of procedure and 48 hours post procedure Invokana-AM of procedure Glipizide -AM of procedure HCTZ-AM of procedure  Except hold medications may take AM medications with sips of water including ASA 81 mg and Plavix 75 mg.   Reviewed other procedure instructions given to patient 07/29/20.

## 2020-08-01 ENCOUNTER — Other Ambulatory Visit: Payer: Self-pay

## 2020-08-01 ENCOUNTER — Encounter (HOSPITAL_COMMUNITY): Payer: Self-pay | Admitting: Cardiovascular Disease

## 2020-08-01 ENCOUNTER — Ambulatory Visit (HOSPITAL_COMMUNITY)
Admission: RE | Disposition: A | Discharge status: Home / Self Care | Payer: Self-pay | Source: Home / Self Care | Attending: Cardiovascular Disease

## 2020-08-01 ENCOUNTER — Ambulatory Visit (HOSPITAL_BASED_OUTPATIENT_CLINIC_OR_DEPARTMENT_OTHER): Payer: Medicare Other

## 2020-08-01 ENCOUNTER — Ambulatory Visit (HOSPITAL_COMMUNITY)
Admission: RE | Admit: 2020-08-01 | Discharge: 2020-08-01 | Disposition: A | Discharge status: Home / Self Care | Payer: Medicare Other | Attending: Cardiovascular Disease | Admitting: Cardiovascular Disease

## 2020-08-01 DIAGNOSIS — I739 Peripheral vascular disease, unspecified: Secondary | ICD-10-CM

## 2020-08-01 DIAGNOSIS — Z79899 Other long term (current) drug therapy: Secondary | ICD-10-CM | POA: Diagnosis not present

## 2020-08-01 DIAGNOSIS — Z881 Allergy status to other antibiotic agents status: Secondary | ICD-10-CM | POA: Insufficient documentation

## 2020-08-01 DIAGNOSIS — Z01810 Encounter for preprocedural cardiovascular examination: Secondary | ICD-10-CM

## 2020-08-01 DIAGNOSIS — E119 Type 2 diabetes mellitus without complications: Secondary | ICD-10-CM | POA: Diagnosis not present

## 2020-08-01 DIAGNOSIS — E1142 Type 2 diabetes mellitus with diabetic polyneuropathy: Secondary | ICD-10-CM | POA: Insufficient documentation

## 2020-08-01 DIAGNOSIS — I70229 Atherosclerosis of native arteries of extremities with rest pain, unspecified extremity: Secondary | ICD-10-CM | POA: Diagnosis not present

## 2020-08-01 DIAGNOSIS — E11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: Secondary | ICD-10-CM | POA: Diagnosis not present

## 2020-08-01 DIAGNOSIS — E785 Hyperlipidemia, unspecified: Secondary | ICD-10-CM | POA: Insufficient documentation

## 2020-08-01 DIAGNOSIS — Z87891 Personal history of nicotine dependence: Secondary | ICD-10-CM | POA: Diagnosis not present

## 2020-08-01 DIAGNOSIS — Z885 Allergy status to narcotic agent status: Secondary | ICD-10-CM | POA: Insufficient documentation

## 2020-08-01 DIAGNOSIS — I70232 Atherosclerosis of native arteries of right leg with ulceration of calf: Secondary | ICD-10-CM | POA: Diagnosis not present

## 2020-08-01 DIAGNOSIS — L97219 Non-pressure chronic ulcer of right calf with unspecified severity: Secondary | ICD-10-CM | POA: Insufficient documentation

## 2020-08-01 DIAGNOSIS — I70234 Atherosclerosis of native arteries of right leg with ulceration of heel and midfoot: Secondary | ICD-10-CM | POA: Diagnosis not present

## 2020-08-01 DIAGNOSIS — Z7982 Long term (current) use of aspirin: Secondary | ICD-10-CM | POA: Diagnosis not present

## 2020-08-01 DIAGNOSIS — Z7984 Long term (current) use of oral hypoglycemic drugs: Secondary | ICD-10-CM | POA: Insufficient documentation

## 2020-08-01 DIAGNOSIS — Z7902 Long term (current) use of antithrombotics/antiplatelets: Secondary | ICD-10-CM | POA: Insufficient documentation

## 2020-08-01 DIAGNOSIS — I10 Essential (primary) hypertension: Secondary | ICD-10-CM | POA: Diagnosis not present

## 2020-08-01 HISTORY — PX: ABDOMINAL AORTOGRAM W/LOWER EXTREMITY: CATH118223

## 2020-08-01 LAB — GLUCOSE, CAPILLARY: Glucose-Capillary: 93 mg/dL (ref 70–99)

## 2020-08-01 SURGERY — ABDOMINAL AORTOGRAM W/LOWER EXTREMITY
Anesthesia: LOCAL

## 2020-08-01 MED ORDER — FENTANYL CITRATE (PF) 100 MCG/2ML IJ SOLN
INTRAMUSCULAR | Status: AC
  Filled 2020-08-01: qty 2

## 2020-08-01 MED ORDER — HYDRALAZINE HCL 20 MG/ML IJ SOLN
INTRAMUSCULAR | Status: DC | PRN
  Administered 2020-08-01: 10 mg via INTRAVENOUS

## 2020-08-01 MED ORDER — SODIUM CHLORIDE 0.9 % IV SOLN: 250.0000 mL | INTRAVENOUS | Status: DC | PRN

## 2020-08-01 MED ORDER — ONDANSETRON HCL 4 MG/2ML IJ SOLN: 4.0000 mg | Freq: Four times a day (QID) | INTRAMUSCULAR | Status: DC | PRN

## 2020-08-01 MED ORDER — SODIUM CHLORIDE 0.9 % WEIGHT BASED INFUSION
3.0000 mL/kg/h | INTRAVENOUS | Status: AC
  Administered 2020-08-01: 3 mL/kg/h via INTRAVENOUS

## 2020-08-01 MED ORDER — ACETAMINOPHEN 325 MG PO TABS: 650.0000 mg | ORAL_TABLET | ORAL | Status: DC | PRN

## 2020-08-01 MED ORDER — LABETALOL HCL 5 MG/ML IV SOLN: 10.0000 mg | INTRAVENOUS | Status: DC | PRN

## 2020-08-01 MED ORDER — HYDRALAZINE HCL 20 MG/ML IJ SOLN
INTRAMUSCULAR | Status: AC
  Filled 2020-08-01: qty 1

## 2020-08-01 MED ORDER — LIDOCAINE HCL (PF) 1 % IJ SOLN
INTRAMUSCULAR | Status: DC | PRN
  Administered 2020-08-01: 20 mL

## 2020-08-01 MED ORDER — ASPIRIN 81 MG PO CHEW: 81.0000 mg | CHEWABLE_TABLET | ORAL | Status: DC

## 2020-08-01 MED ORDER — LIDOCAINE HCL (PF) 1 % IJ SOLN
INTRAMUSCULAR | Status: AC
  Filled 2020-08-01: qty 30

## 2020-08-01 MED ORDER — FENTANYL CITRATE (PF) 100 MCG/2ML IJ SOLN
INTRAMUSCULAR | Status: DC | PRN
  Administered 2020-08-01: 25 ug via INTRAVENOUS

## 2020-08-01 MED ORDER — SODIUM CHLORIDE 0.9% FLUSH: 3.0000 mL | INTRAVENOUS | Status: DC | PRN

## 2020-08-01 MED ORDER — IODIXANOL 320 MG/ML IV SOLN
INTRAVENOUS | Status: DC | PRN
  Administered 2020-08-01: 117 mL via INTRA_ARTERIAL

## 2020-08-01 MED ORDER — HYDRALAZINE HCL 20 MG/ML IJ SOLN: 5.0000 mg | INTRAMUSCULAR | Status: DC | PRN

## 2020-08-01 MED ORDER — ASPIRIN EC 81 MG PO TBEC: 81.0000 mg | DELAYED_RELEASE_TABLET | Freq: Every day | ORAL | Status: DC

## 2020-08-01 MED ORDER — SODIUM CHLORIDE 0.9% FLUSH: 3.0000 mL | Freq: Two times a day (BID) | INTRAVENOUS | Status: DC

## 2020-08-01 MED ORDER — HEPARIN (PORCINE) IN NACL 1000-0.9 UT/500ML-% IV SOLN
INTRAVENOUS | Status: AC
  Filled 2020-08-01: qty 1000

## 2020-08-01 MED ORDER — SODIUM CHLORIDE 0.9 % WEIGHT BASED INFUSION: 1.0000 mL/kg/h | INTRAVENOUS | Status: DC

## 2020-08-01 MED ORDER — HEPARIN (PORCINE) IN NACL 1000-0.9 UT/500ML-% IV SOLN
INTRAVENOUS | Status: DC | PRN
  Administered 2020-08-01 (×2): 500 mL

## 2020-08-01 MED ORDER — SODIUM CHLORIDE 0.9 % IV SOLN: INTRAVENOUS | Status: AC

## 2020-08-01 MED ORDER — ATORVASTATIN CALCIUM 80 MG PO TABS
80.0000 mg | ORAL_TABLET | Freq: Every day | ORAL | Status: DC
  Filled 2020-08-01: qty 1

## 2020-08-01 SURGICAL SUPPLY — 10 items
CATH ANGIO 5F PIGTAIL 65CM (CATHETERS) ×2 IMPLANT
CLOSURE MYNX CONTROL 5F (Vascular Products) ×2 IMPLANT
KIT PV (KITS) ×2 IMPLANT
SHEATH PINNACLE 5F 10CM (SHEATH) ×2 IMPLANT
STOPCOCK MORSE 400PSI 3WAY (MISCELLANEOUS) ×2 IMPLANT
SYR MEDRAD MARK 7 150ML (SYRINGE) ×2 IMPLANT
TRANSDUCER W/STOPCOCK (MISCELLANEOUS) ×2 IMPLANT
TRAY PV CATH (CUSTOM PROCEDURE TRAY) ×2 IMPLANT
TUBING CIL FLEX 10 FLL-RA (TUBING) ×2 IMPLANT
WIRE HITORQ VERSACORE ST 145CM (WIRE) ×2 IMPLANT

## 2020-08-01 NOTE — Progress Notes (Signed)
Family was not under the impression that she had to stay with the pt post, different arrangements made, daughter will pick up after work, pt will have to stay till she is out of work@ 1700.

## 2020-08-01 NOTE — Consult Note (Signed)
Hospital Consult    Reason for Consult:  R TMA wound Referring Physician:  Dr. Gwenlyn Found MRN #:  [id number]  History of Present Illness: This is a 79 y.o. female has history of bilateral lower extremity endovascular operations.  She also has a right common femoral endarterectomy with bovine pericardial patch angioplasty.  More recently underwent coronary artery bypass grafting with harvest of her right greater saphenous vein.  She has had some difficulty healing this wound.  She also has a previous TMA on the right she has a recent ulcer that has developed on there which is small with occasional drainage.  She is set to see the wound care center for this.  She does walk.  She has never had bypass surgery in her lower extremities.  She is on aspirin, Plavix and a statin.  Past Medical History:  Diagnosis Date  . Arthritis   . Cancer ([place])    removal from nose - MOSE procedure   . Complication of anesthesia    VERSED- agitated, muscle spasms, "jerking" , frantic , (never had this occurence in the pas)   . Diabetes mellitus without complication ([place])   . GERD (gastroesophageal reflux disease)   . Heart murmur   . History of hiatal hernia   . Hyperlipidemia   . Hypertension 11/20/11   ECHO- EF>55% Borderline concentric left ventricular hypertrophy. There is a small calcified mass in the L:A near the LA appendage. No valvular masses seen with associated mitral annular calcification. LA Volume/ BSA27.4 ml/m2 No AS. Right ventricular systolic pressure is elevated at 60mmHg.  Marland Kitchen Peripheral vascular disease ([place])   . Pneumonia    not hosp.   . 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  PTCAs with drug coated balloon Lt ant tibial artery and Lt popliteal artery  11/26/2019    Past Surgical History:  Procedure Laterality Date  . ABDOMINAL AORTAGRAM  11/25/2019   ABDOMINAL AORTOGRAM W/LOWER EXTREMITY   . ABDOMINAL AORTOGRAM N/A 06/15/2019   Procedure: ABDOMINAL AORTOGRAM;  Surgeon: [name], MD;  Location: [facility];  Service: Cardiovascular;  Laterality: N/A;  . ABDOMINAL AORTOGRAM W/LOWER EXTREMITY N/A 11/25/2019   Procedure: ABDOMINAL AORTOGRAM W/LOWER EXTREMITY;  Surgeon: [name], MD;  Location: [facility];  Service: Cardiovascular;  Laterality: N/A;  Lt leg  . APPENDECTOMY    . APPLICATION OF WOUND VAC Right 07/21/2019   Procedure: Application Of Prevena Wound Vac Right Groin;  Surgeon: [name], MD;  Location: [facility];  Service: Vascular;  Laterality: Right;  . CARDIAC CATHETERIZATION    . CARPAL TUNNEL RELEASE    . CARPAL TUNNEL RELEASE    . CESAREAN SECTION     x 2  . COLONOSCOPY    . CORONARY ARTERY BYPASS GRAFT N/A 03/24/2020   Procedure: CORONARY ARTERY BYPASS GRAFTING (CABG) using LIMA to LAD (m); RIMA to RAMUS; Endoscopic Right Greater Saphenous Vein: SVG to Diag1; SVG to PLB (right); and SVG to PL (left).;  Surgeon: [name], MD;  Location: [facility];  Service: Open Heart Surgery;  Laterality: N/A;  BILATERAL IMA  . ENDARTERECTOMY FEMORAL Right 07/21/2019   Procedure: RIGHT ENDARTERECTOMY FEMORAL WITH PATCH ANGIOPLASTY;  Surgeon: [name], MD;  Location: [facility];  Service: Vascular;  Laterality: Right;  . ENDOVEIN HARVEST OF GREATER SAPHENOUS VEIN Right 03/24/2020   Procedure: ENDOVEIN HARVEST OF GREATER SAPHENOUS VEIN;  Surgeon: [name], MD;  Location: [facility];  Service:  Open Heart Surgery;  Laterality: Right;  . INTRAVASCULAR LITHOTRIPSY  11/25/2019   Procedure: INTRAVASCULAR LITHOTRIPSY;  Surgeon: [name], MD;  Location: [facility];  Service: Cardiovascular;;  LT. SFA  . LEFT HEART CATH AND CORONARY ANGIOGRAPHY N/A 03/22/2020   Procedure: LEFT HEART CATH AND CORONARY ANGIOGRAPHY;  Surgeon: Martinique, Peter M, MD;  Location: [facility];  Service: Cardiovascular;  Laterality: N/A;  . LOWER EXTREMITY ANGIOGRAPHY Bilateral 06/15/2019   Procedure: Lower Extremity Angiography;  Surgeon: [name], MD;  Location: [facility];  Service: Cardiovascular;  Laterality: Bilateral;  . LOWER EXTREMITY ANGIOGRAPHY Right 08/03/2019   Procedure: LOWER EXTREMITY ANGIOGRAPHY;  Surgeon: [name], MD;  Location: [facility];  Service: Cardiovascular;  Laterality: Right;  . PATCH ANGIOPLASTY Right 07/21/2019   Procedure: Patch Angioplasty Right Femoral Artery;  Surgeon: [name], MD;  Location: [facility];  Service: Vascular;  Laterality: Right;  . PERIPHERAL VASCULAR ATHERECTOMY  08/03/2019   Procedure: PERIPHERAL VASCULAR ATHERECTOMY;  Surgeon: [name], MD;  Location: [facility];  Service: Cardiovascular;;  right SFA, right TP trunk  . PERIPHERAL VASCULAR ATHERECTOMY  11/25/2019   Procedure: PERIPHERAL VASCULAR ATHERECTOMY;  Surgeon: [name], MD;  Location: [facility];  Service: Cardiovascular;;  Lt.  POPLITEAL and AT  . PERIPHERAL VASCULAR BALLOON ANGIOPLASTY Left 06/15/2019   Procedure: PERIPHERAL VASCULAR BALLOON ANGIOPLASTY;  Surgeon: [name], MD;  Location: [facility];  Service: Cardiovascular;  Laterality: Left;  SFA UNSUCCESSFUL UNABLE TO CROSS LESION  . PERIPHERAL VASCULAR BALLOON ANGIOPLASTY  08/03/2019   Procedure: PERIPHERAL VASCULAR BALLOON ANGIOPLASTY;  Surgeon: [name], MD;  Location: [facility];  Service: Cardiovascular;;  right SFA, Right TP trunk  . TEE WITHOUT CARDIOVERSION N/A 03/24/2020   Procedure: TRANSESOPHAGEAL ECHOCARDIOGRAM (TEE);  Surgeon: [name], MD;  Location: [facility];  Service: Open Heart Surgery;  Laterality: N/A;  . TONSILLECTOMY     and adenoidectomy  . 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/2019   Procedure: TRANSMETATARSAL AMPUTATION;  Surgeon: Trula Slade, DPM;  Location: [facility];  Service: Podiatry;  Laterality: Right;  . TUBAL LIGATION      Allergies  Allergen Reactions  . Bactrim [Sulfamethoxazole-Trimethoprim] Other (See Comments)    ^ K+(  elevated)   . Demerol [Meperidine]     Delusional   . Scopolamine     Delusional   . Versed [Midazolam] Anxiety    Frantic, out of my mind, agitated     Prior to Admission medications   Medication Sig Start Date End Date Taking? Authorizing Provider  Ascorbic Acid (VITAMIN C) 100 MG tablet Take 100 mg by mouth daily with supper.    Yes [provider]  aspirin EC 81 MG tablet Take 81 mg by mouth at bedtime.    Yes [provider]  atorvastatin (LIPITOR) 80 MG tablet Take 1 tablet (80 mg total) by mouth daily. Patient taking differently: Take 80 mg by mouth at bedtime.  06/17/20 09/15/20 Yes [name], MD  canagliflozin (INVOKANA) 300 MG TABS tablet TAKE 1 TABLET BY MOUTH BEFORE BREAKFAST Patient taking differently: Take 300 mg by mouth daily before breakfast.  07/06/20  Yes Elayne Snare, MD  CINNAMON PO Take 2 tablets by mouth in the morning, at noon, and at bedtime.    Yes [provider]  clopidogrel (PLAVIX) 75 MG tablet Take 1 tablet by mouth once daily  Patient taking differently: Take 75 mg by mouth daily.  07/19/20  Yes [name], MD  glipiZIDE (GLUCOTROL XL) 5 MG 24 hr tablet Take 1 tablet by mouth once daily with breakfast Patient taking differently: Take 5 mg by mouth daily with breakfast.  07/19/20  Yes Elayne Snare, MD  hydrochlorothiazide (HYDRODIURIL) 12.5 MG tablet Take 1 tablet (12.5 mg total) by mouth daily. 02/02/20  Yes Elayne Snare, MD  metFORMIN (GLUCOPHAGE) 1000 MG tablet Take 1 tablet (1,000 mg total) by mouth 2 (two) times daily. 11/28/19  Yes Isaiah Serge, NP  metoprolol tartrate (LOPRESSOR) 25 MG tablet Take 1.5 tablets (37.5 mg total) by mouth 2 (two) times daily. 04/06/20  Yes Lendon Colonel, NP  pantoprazole (PROTONIX) 40 MG tablet Take 1 tablet (40 mg total) by mouth daily. Patient taking differently: Take 40 mg by mouth every Monday, Wednesday, and Friday at 8 PM.  08/05/19  Yes Furth, Cadence H, PA-C  Probiotic  Product ([employer] SENIOR PROBIOTIC PO) Take 1 capsule by mouth daily with breakfast.   Yes [provider]  pyridoxine (B-6) 100 MG tablet Take 100 mg by mouth every evening.    Yes [provider]  telmisartan (MICARDIS) 80 MG tablet Take 1 tablet by mouth once daily Patient taking differently: Take 80 mg by mouth daily with breakfast.  04/18/20  Yes [name], MD  vitamin B-12 (CYANOCOBALAMIN) 500 MCG tablet Take 500 mcg by mouth daily.   Yes [provider]  acetaminophen (TYLENOL) 500 MG tablet Take 1,000 mg by mouth every 6 (six) hours as needed for moderate pain or headache.    [provider]  calcium carbonate (TUMS - DOSED IN MG ELEMENTAL CALCIUM) 500 MG chewable tablet Chew 2 tablets by mouth daily as needed for indigestion or heartburn.     [provider]  Polyethyl Glycol-Propyl Glycol (SYSTANE OP) Place 1 drop into both eyes 2 (two) times daily as needed (Dry eyes).    [provider]    Social History   Socioeconomic History  . Marital status: Widowed    Spouse name: Not on file  . Number of children: Not on file  . Years of education: Not on file  . Highest education level: Not on file  Occupational History  . Not on file  Tobacco Use  . Smoking status: Former Smoker    Quit date: 03/31/1972    Years since quitting: 48.3  . Smokeless tobacco: Never Used  Vaping Use  . Vaping Use: Never used  Substance and Sexual Activity  . Alcohol use: No  . Drug use: No  . Sexual activity: Not on file  Other Topics Concern  . Not on file  Social History Narrative  . Not on file   Social Determinants of Health   Financial Resource Strain:   . Difficulty of Paying Living Expenses: Not on file  Food Insecurity:   . Worried About Charity fundraiser in the Last Year: Not on file  . Ran Out of Food in the Last Year: Not on file  Transportation Needs:   . Lack of Transportation (Medical): Not on file  . Lack of  Transportation (Non-Medical): Not on file  Physical Activity:   . Days of Exercise per Week: Not on file  . Minutes of Exercise per Session: Not on file  Stress:   . Feeling of Stress : Not on file  Social Connections:   . Frequency of Communication with Friends and  Family: Not on file  . Frequency of Social Gatherings with Friends and Family: Not on file  . Attends Religious Services: Not on file  . Active Member of Clubs or Organizations: Not on file  . Attends Archivist Meetings: Not on file  . Marital Status: Not on file  Intimate Partner Violence:   . Fear of Current or Ex-Partner: Not on file  . Emotionally Abused: Not on file  . Physically Abused: Not on file  . Sexually Abused: Not on file     Family History  Problem Relation Age of Onset  . Cancer - Prostate Father   . Cancer - Colon Father   . Stroke Mother   . Hypertension Mother   . Hyperlipidemia Mother   . Melanoma Brother     ROS Cardiovascular: []  chest pain/pressure []  palpitations []  SOB lying flat []  DOE []  pain in legs while walking []  pain in legs at rest []  pain in legs at night []  non-healing ulcers []  hx of DVT []  swelling in legs  Pulmonary: []  productive cough []  asthma/wheezing []  home O2  Neurologic: []  weakness in []  arms []  legs []  numbness in []  arms []  legs []  hx of CVA []  mini stroke [] difficulty speaking or slurred speech []  temporary loss of vision in one eye []  dizziness  Hematologic: []  hx of cancer []  bleeding problems []  problems with blood clotting easily  Endocrine:   []  diabetes []  thyroid disease  GI []  vomiting blood []  blood in stool  GU: []  CKD/renal failure []  HD--[]  M/W/F or []  T/T/S []  burning with urination []  blood in urine  Psychiatric: []  anxiety []  depression  Musculoskeletal: []  arthritis []  joint pain  Integumentary: []  rashes [x]  ulcers  Constitutional: []  fever []  chills   Physical Examination  Vitals:    08/01/20 0920 08/01/20 0925  BP: (!) 154/65 (!) 166/70  Pulse: 76 78  Resp: (!) 7 (!) 21  Temp:    SpO2: 100% 100%   Body mass index is 23.66 kg/m.  General: nad HENT: WNL, normocephalic Pulmonary: normal non-labored breathing Cardiac: There is a thrill of the right common femoral artery, bandage over the left common femoral artery Musculoskeletal: Small ulceration suture line of otherwise well-healed right transmetatarsal amputation Neurologic: A&O X 3; Appropriate Affect   CBC    Component Value Date/Time   WBC 9.8 07/29/2020 0942   WBC 7.3 03/30/2020 0603   RBC 4.44 07/29/2020 0942   RBC 3.23 (L) 03/30/2020 0603   HGB 12.2 07/29/2020 0942   HCT 37.4 07/29/2020 0942   PLT 304 07/29/2020 0942   MCV 84 07/29/2020 0942   MCH 27.5 07/29/2020 0942   MCH 28.8 03/30/2020 0603   MCHC 32.6 07/29/2020 0942   MCHC 31.8 03/30/2020 0603   RDW 14.8 07/29/2020 0942   LYMPHSABS 2,604 12/09/2019 1136   MONOABS 0.9 11/01/2019 1730   EOSABS 218 12/09/2019 1136   BASOSABS 89 12/09/2019 1136    BMET    Component Value Date/Time   NA 142 07/29/2020 0942   K 4.4 07/29/2020 0942   CL 103 07/29/2020 0942   CO2 23 07/29/2020 0942   GLUCOSE 124 (H) 07/29/2020 0942   GLUCOSE 112 (H) 06/07/2020 0845   BUN 17 07/29/2020 0942   CREATININE 0.86 07/29/2020 0942   CREATININE 0.86 12/09/2019 1136   CALCIUM 10.3 07/29/2020 0942   GFRNONAA 65 07/29/2020 0942   GFRAA 75 07/29/2020 0942    COAGS: Lab Results  Component Value  Date   INR 1.8 (H) 03/24/2020   INR 1.1 03/24/2020   INR 1.1 07/21/2019     Non-Invasive Vascular Imaging:   +-------+-----------+-----------+------------+------------+  ABI/TBIToday's ABIToday's TBIPrevious ABIPrevious TBI  +-------+-----------+-----------+------------+------------+  Right 0.98    amputation 0.47            +-------+-----------+-----------+------------+------------+  Left  1.02    0.24    1.0              +-------+-----------+-----------+------------+------------+    ASSESSMENT/PLAN: This is a 79 y.o. 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  I reviewed her angiography from today.  She will need repeat right common femoral endarterectomy with bypass to likely her below-knee popliteal artery.  Vein was recently harvested for coronary artery bypass.  I discussed the risk and benefits we will schedule her in the near future.  Should she have further questions I can see her in the office but I I discussed scheduling surgery without further delay.   Danayah Smyre C. Donzetta Matters, MD [facility] Office: [phone] Pager: [phone]

## 2020-08-01 NOTE — H&P (View-Only) (Signed)
Hospital Consult    Reason for Consult:  R TMA wound Referring Physician:  Dr. Gwenlyn Found MRN #:  [id number]  History of Present Illness: This is a 79 y.o. female has history of bilateral lower extremity endovascular operations.  She also has a right common femoral endarterectomy with bovine pericardial patch angioplasty.  More recently underwent coronary artery bypass grafting with harvest of her right greater saphenous vein.  She has had some difficulty healing this wound.  She also has a previous TMA on the right she has a recent ulcer that has developed on there which is small with occasional drainage.  She is set to see the wound care center for this.  She does walk.  She has never had bypass surgery in her lower extremities.  She is on aspirin, Plavix and a statin.  Past Medical History:  Diagnosis Date  . Arthritis   . Cancer ([place])    removal from nose - MOSE procedure   . Complication of anesthesia    VERSED- agitated, muscle spasms, "jerking" , frantic , (never had this occurence in the pas)   . Diabetes mellitus without complication ([place])   . GERD (gastroesophageal reflux disease)   . Heart murmur   . History of hiatal hernia   . Hyperlipidemia   . Hypertension 11/20/11   ECHO- EF>55% Borderline concentric left ventricular hypertrophy. There is a small calcified mass in the L:A near the LA appendage. No valvular masses seen with associated mitral annular calcification. LA Volume/ BSA27.4 ml/m2 No AS. Right ventricular systolic pressure is elevated at 53mmHg.  Marland Kitchen Peripheral vascular disease ([employer])   . Pneumonia    not hosp.   . 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  PTCAs with drug coated balloon Lt ant tibial artery and Lt popliteal artery  11/26/2019    Past Surgical History:  Procedure Laterality Date  . ABDOMINAL AORTAGRAM  11/25/2019   ABDOMINAL AORTOGRAM W/LOWER EXTREMITY   . ABDOMINAL AORTOGRAM N/A 06/15/2019   Procedure: ABDOMINAL AORTOGRAM;  Surgeon: [name], MD;  Location: [facility];  Service: Cardiovascular;  Laterality: N/A;  . ABDOMINAL AORTOGRAM W/LOWER EXTREMITY N/A 11/25/2019   Procedure: ABDOMINAL AORTOGRAM W/LOWER EXTREMITY;  Surgeon: [name], MD;  Location: [facility];  Service: Cardiovascular;  Laterality: N/A;  Lt leg  . APPENDECTOMY    . APPLICATION OF WOUND VAC Right 07/21/2019   Procedure: Application Of Prevena Wound Vac Right Groin;  Surgeon: [name], MD;  Location: [facility];  Service: Vascular;  Laterality: Right;  . CARDIAC CATHETERIZATION    . CARPAL TUNNEL RELEASE    . CARPAL TUNNEL RELEASE    . CESAREAN SECTION     x 2  . COLONOSCOPY    . CORONARY ARTERY BYPASS GRAFT N/A 03/24/2020   Procedure: CORONARY ARTERY BYPASS GRAFTING (CABG) using LIMA to LAD (m); RIMA to RAMUS; Endoscopic Right Greater Saphenous Vein: SVG to Diag1; SVG to PLB (right); and SVG to PL (left).;  Surgeon: [name], MD;  Location: [facility];  Service: Open Heart Surgery;  Laterality: N/A;  BILATERAL IMA  . ENDARTERECTOMY FEMORAL Right 07/21/2019   Procedure: RIGHT ENDARTERECTOMY FEMORAL WITH PATCH ANGIOPLASTY;  Surgeon: [name], MD;  Location: [facility];  Service: Vascular;  Laterality: Right;  . ENDOVEIN HARVEST OF GREATER SAPHENOUS VEIN Right 03/24/2020   Procedure: ENDOVEIN HARVEST OF GREATER SAPHENOUS VEIN;  Surgeon: [name], MD;  Location: [facility];  Service:  Open Heart Surgery;  Laterality: Right;  . INTRAVASCULAR LITHOTRIPSY  11/25/2019   Procedure: INTRAVASCULAR LITHOTRIPSY;  Surgeon: [name], MD;  Location: [facility];  Service: Cardiovascular;;  LT. SFA  . LEFT HEART CATH AND CORONARY ANGIOGRAPHY N/A 03/22/2020   Procedure: LEFT HEART CATH AND CORONARY ANGIOGRAPHY;  Surgeon: Martinique, Peter M, MD;  Location: [facility];  Service: Cardiovascular;  Laterality: N/A;  . LOWER EXTREMITY ANGIOGRAPHY Bilateral 06/15/2019   Procedure: Lower Extremity Angiography;  Surgeon: [name], MD;  Location: [facility];  Service: Cardiovascular;  Laterality: Bilateral;  . LOWER EXTREMITY ANGIOGRAPHY Right 08/03/2019   Procedure: LOWER EXTREMITY ANGIOGRAPHY;  Surgeon: [name], MD;  Location: [facility];  Service: Cardiovascular;  Laterality: Right;  . PATCH ANGIOPLASTY Right 07/21/2019   Procedure: Patch Angioplasty Right Femoral Artery;  Surgeon: [name], MD;  Location: [facility];  Service: Vascular;  Laterality: Right;  . PERIPHERAL VASCULAR ATHERECTOMY  08/03/2019   Procedure: PERIPHERAL VASCULAR ATHERECTOMY;  Surgeon: [name], MD;  Location: [facility];  Service: Cardiovascular;;  right SFA, right TP trunk  . PERIPHERAL VASCULAR ATHERECTOMY  11/25/2019   Procedure: PERIPHERAL VASCULAR ATHERECTOMY;  Surgeon: [name], MD;  Location: [facility];  Service: Cardiovascular;;  Lt.  POPLITEAL and AT  . PERIPHERAL VASCULAR BALLOON ANGIOPLASTY Left 06/15/2019   Procedure: PERIPHERAL VASCULAR BALLOON ANGIOPLASTY;  Surgeon: [name], MD;  Location: [facility];  Service: Cardiovascular;  Laterality: Left;  SFA UNSUCCESSFUL UNABLE TO CROSS LESION  . PERIPHERAL VASCULAR BALLOON ANGIOPLASTY  08/03/2019   Procedure: PERIPHERAL VASCULAR BALLOON ANGIOPLASTY;  Surgeon: [name], MD;  Location: [facility];  Service: Cardiovascular;;  right SFA, Right TP trunk  . TEE WITHOUT CARDIOVERSION N/A 03/24/2020   Procedure: TRANSESOPHAGEAL ECHOCARDIOGRAM (TEE);  Surgeon: [name], MD;  Location: [facility];  Service: Open Heart Surgery;  Laterality: N/A;  . TONSILLECTOMY     and adenoidectomy  . 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/2019   Procedure: TRANSMETATARSAL AMPUTATION;  Surgeon: Trula Slade, DPM;  Location: [facility];  Service: Podiatry;  Laterality: Right;  . TUBAL LIGATION      Allergies  Allergen Reactions  . Bactrim [Sulfamethoxazole-Trimethoprim] Other (See Comments)    ^ K+(  elevated)   . Demerol [Meperidine]     Delusional   . Scopolamine     Delusional   . Versed [Midazolam] Anxiety    Frantic, out of my mind, agitated     Prior to Admission medications   Medication Sig Start Date End Date Taking? Authorizing Provider  Ascorbic Acid (VITAMIN C) 100 MG tablet Take 100 mg by mouth daily with supper.    Yes [provider]  aspirin EC 81 MG tablet Take 81 mg by mouth at bedtime.    Yes [provider]  atorvastatin (LIPITOR) 80 MG tablet Take 1 tablet (80 mg total) by mouth daily. Patient taking differently: Take 80 mg by mouth at bedtime.  06/17/20 09/15/20 Yes [name], MD  canagliflozin (INVOKANA) 300 MG TABS tablet TAKE 1 TABLET BY MOUTH BEFORE BREAKFAST Patient taking differently: Take 300 mg by mouth daily before breakfast.  07/06/20  Yes Elayne Snare, MD  CINNAMON PO Take 2 tablets by mouth in the morning, at noon, and at bedtime.    Yes [provider]  clopidogrel (PLAVIX) 75 MG tablet Take 1 tablet by mouth once daily  Patient taking differently: Take 75 mg by mouth daily.  07/19/20  Yes [name], MD  glipiZIDE (GLUCOTROL XL) 5 MG 24 hr tablet Take 1 tablet by mouth once daily with breakfast Patient taking differently: Take 5 mg by mouth daily with breakfast.  07/19/20  Yes Elayne Snare, MD  hydrochlorothiazide (HYDRODIURIL) 12.5 MG tablet Take 1 tablet (12.5 mg total) by mouth daily. 02/02/20  Yes Elayne Snare, MD  metFORMIN (GLUCOPHAGE) 1000 MG tablet Take 1 tablet (1,000 mg total) by mouth 2 (two) times daily. 11/28/19  Yes Isaiah Serge, NP  metoprolol tartrate (LOPRESSOR) 25 MG tablet Take 1.5 tablets (37.5 mg total) by mouth 2 (two) times daily. 04/06/20  Yes Lendon Colonel, NP  pantoprazole (PROTONIX) 40 MG tablet Take 1 tablet (40 mg total) by mouth daily. Patient taking differently: Take 40 mg by mouth every Monday, Wednesday, and Friday at 8 PM.  08/05/19  Yes Furth, Cadence H, PA-C  Probiotic  Product ([employer] SENIOR PROBIOTIC PO) Take 1 capsule by mouth daily with breakfast.   Yes [provider]  pyridoxine (B-6) 100 MG tablet Take 100 mg by mouth every evening.    Yes [provider]  telmisartan (MICARDIS) 80 MG tablet Take 1 tablet by mouth once daily Patient taking differently: Take 80 mg by mouth daily with breakfast.  04/18/20  Yes [name], MD  vitamin B-12 (CYANOCOBALAMIN) 500 MCG tablet Take 500 mcg by mouth daily.   Yes [provider]  acetaminophen (TYLENOL) 500 MG tablet Take 1,000 mg by mouth every 6 (six) hours as needed for moderate pain or headache.    [provider]  calcium carbonate (TUMS - DOSED IN MG ELEMENTAL CALCIUM) 500 MG chewable tablet Chew 2 tablets by mouth daily as needed for indigestion or heartburn.     [provider]  Polyethyl Glycol-Propyl Glycol (SYSTANE OP) Place 1 drop into both eyes 2 (two) times daily as needed (Dry eyes).    [provider]    Social History   Socioeconomic History  . Marital status: Widowed    Spouse name: Not on file  . Number of children: Not on file  . Years of education: Not on file  . Highest education level: Not on file  Occupational History  . Not on file  Tobacco Use  . Smoking status: Former Smoker    Quit date: 03/31/1972    Years since quitting: 48.3  . Smokeless tobacco: Never Used  Vaping Use  . Vaping Use: Never used  Substance and Sexual Activity  . Alcohol use: No  . Drug use: No  . Sexual activity: Not on file  Other Topics Concern  . Not on file  Social History Narrative  . Not on file   Social Determinants of Health   Financial Resource Strain:   . Difficulty of Paying Living Expenses: Not on file  Food Insecurity:   . Worried About Charity fundraiser in the Last Year: Not on file  . Ran Out of Food in the Last Year: Not on file  Transportation Needs:   . Lack of Transportation (Medical): Not on file  . Lack of  Transportation (Non-Medical): Not on file  Physical Activity:   . Days of Exercise per Week: Not on file  . Minutes of Exercise per Session: Not on file  Stress:   . Feeling of Stress : Not on file  Social Connections:   . Frequency of Communication with Friends and  Family: Not on file  . Frequency of Social Gatherings with Friends and Family: Not on file  . Attends Religious Services: Not on file  . Active Member of Clubs or Organizations: Not on file  . Attends Archivist Meetings: Not on file  . Marital Status: Not on file  Intimate Partner Violence:   . Fear of Current or Ex-Partner: Not on file  . Emotionally Abused: Not on file  . Physically Abused: Not on file  . Sexually Abused: Not on file     Family History  Problem Relation Age of Onset  . Cancer - Prostate Father   . Cancer - Colon Father   . Stroke Mother   . Hypertension Mother   . Hyperlipidemia Mother   . Melanoma Brother     ROS Cardiovascular: []  chest pain/pressure []  palpitations []  SOB lying flat []  DOE []  pain in legs while walking []  pain in legs at rest []  pain in legs at night []  non-healing ulcers []  hx of DVT []  swelling in legs  Pulmonary: []  productive cough []  asthma/wheezing []  home O2  Neurologic: []  weakness in []  arms []  legs []  numbness in []  arms []  legs []  hx of CVA []  mini stroke [] difficulty speaking or slurred speech []  temporary loss of vision in one eye []  dizziness  Hematologic: []  hx of cancer []  bleeding problems []  problems with blood clotting easily  Endocrine:   []  diabetes []  thyroid disease  GI []  vomiting blood []  blood in stool  GU: []  CKD/renal failure []  HD--[]  M/W/F or []  T/T/S []  burning with urination []  blood in urine  Psychiatric: []  anxiety []  depression  Musculoskeletal: []  arthritis []  joint pain  Integumentary: []  rashes [x]  ulcers  Constitutional: []  fever []  chills   Physical Examination  Vitals:    08/01/20 0920 08/01/20 0925  BP: (!) 154/65 (!) 166/70  Pulse: 76 78  Resp: (!) 7 (!) 21  Temp:    SpO2: 100% 100%   Body mass index is 23.66 kg/m.  General: nad HENT: WNL, normocephalic Pulmonary: normal non-labored breathing Cardiac: There is a thrill of the right common femoral artery, bandage over the left common femoral artery Musculoskeletal: Small ulceration suture line of otherwise well-healed right transmetatarsal amputation Neurologic: A&O X 3; Appropriate Affect   CBC    Component Value Date/Time   WBC 9.8 07/29/2020 0942   WBC 7.3 03/30/2020 0603   RBC 4.44 07/29/2020 0942   RBC 3.23 (L) 03/30/2020 0603   HGB 12.2 07/29/2020 0942   HCT 37.4 07/29/2020 0942   PLT 304 07/29/2020 0942   MCV 84 07/29/2020 0942   MCH 27.5 07/29/2020 0942   MCH 28.8 03/30/2020 0603   MCHC 32.6 07/29/2020 0942   MCHC 31.8 03/30/2020 0603   RDW 14.8 07/29/2020 0942   LYMPHSABS 2,604 12/09/2019 1136   MONOABS 0.9 11/01/2019 1730   EOSABS 218 12/09/2019 1136   BASOSABS 89 12/09/2019 1136    BMET    Component Value Date/Time   NA 142 07/29/2020 0942   K 4.4 07/29/2020 0942   CL 103 07/29/2020 0942   CO2 23 07/29/2020 0942   GLUCOSE 124 (H) 07/29/2020 0942   GLUCOSE 112 (H) 06/07/2020 0845   BUN 17 07/29/2020 0942   CREATININE 0.86 07/29/2020 0942   CREATININE 0.86 12/09/2019 1136   CALCIUM 10.3 07/29/2020 0942   GFRNONAA 65 07/29/2020 0942   GFRAA 75 07/29/2020 0942    COAGS: Lab Results  Component Value  Date   INR 1.8 (H) 03/24/2020   INR 1.1 03/24/2020   INR 1.1 07/21/2019     Non-Invasive Vascular Imaging:   +-------+-----------+-----------+------------+------------+  ABI/TBIToday's ABIToday's TBIPrevious ABIPrevious TBI  +-------+-----------+-----------+------------+------------+  Right 0.98    amputation 0.47            +-------+-----------+-----------+------------+------------+  Left  1.02    0.24    1.0              +-------+-----------+-----------+------------+------------+    ASSESSMENT/PLAN: This is a 79 y.o. 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  I reviewed her angiography from today.  She will need repeat right common femoral endarterectomy with bypass to likely her below-knee popliteal artery.  Vein was recently harvested for coronary artery bypass.  I discussed the risk and benefits we will schedule her in the near future.  Should she have further questions I can see her in the office but I I discussed scheduling surgery without further delay.   Kessie Croston C. Donzetta Matters, MD [facility] Office: [phone] Pager: [phone]

## 2020-08-01 NOTE — Discharge Instructions (Signed)
HOLD METFORMIN// RESTART 11/11 MORNING   Angiogram, Care After This sheet gives you information about how to care for yourself after your procedure. Your health care provider may also give you more specific instructions. If you have problems or questions, contact your health care provider. What can I expect after the procedure? After the procedure, it is common to have bruising and tenderness at the catheter insertion area. Follow these instructions at home: Insertion site care  Follow instructions from your health care provider about how to take care of your insertion site. Make sure you: ? Wash your hands with soap and water before you change your bandage (dressing). If soap and water are not available, use hand sanitizer. ? Change your dressing as told by your health care provider. ? Leave stitches (sutures), skin glue, or adhesive strips in place. These skin closures may need to stay in place for 2 weeks or longer. If adhesive strip edges start to loosen and curl up, you may trim the loose edges. Do not remove adhesive strips completely unless your health care provider tells you to do that.  Do not take baths, swim, or use a hot tub until your health care provider approves.  You may shower 24-48 hours after the procedure or as told by your health care provider. ? Gently wash the site with plain soap and water. ? Pat the area dry with a clean towel. ? Do not rub the site. This may cause bleeding.  Do not apply powder or lotion to the site. Keep the site clean and dry.  Check your insertion site every day for signs of infection. Check for: ? Redness, swelling, or pain. ? Fluid or blood. ? Warmth. ? Pus or a bad smell. Activity  Rest as told by your health care provider, usually for 1-2 days.  Do not lift anything that is heavier than 10 lbs. (4.5 kg) or as told by your health care provider.  Do not drive for 24 hours if you were given a medicine to help you relax (sedative).  Do  not drive or use heavy machinery while taking prescription pain medicine. General instructions   Return to your normal activities as told by your health care provider, usually in about a week. Ask your health care provider what activities are safe for you.  If the catheter site starts bleeding, lie flat and put pressure on the site. If the bleeding does not stop, get help right away. This is a medical emergency.  Drink enough fluid to keep your urine clear or pale yellow. This helps flush the contrast dye from your body.  Take over-the-counter and prescription medicines only as told by your health care provider.  Keep all follow-up visits as told by your health care provider. This is important. Contact a health care provider if:  You have a fever or chills.  You have redness, swelling, or pain around your insertion site.  You have fluid or blood coming from your insertion site.  The insertion site feels warm to the touch.  You have pus or a bad smell coming from your insertion site.  You have bruising around the insertion site.  You notice blood collecting in the tissue around the catheter site (hematoma). The hematoma may be painful to the touch. Get help right away if:  You have severe pain at the catheter insertion area.  The catheter insertion area swells very fast.  The catheter insertion area is bleeding, and the bleeding does not stop when  you hold steady pressure on the area.  The area near or just beyond the catheter insertion site becomes pale, cool, tingly, or numb. These symptoms may represent a serious problem that is an emergency. Do not wait to see if the symptoms will go away. Get medical help right away. Call your local emergency services (911 in the [place] Do not drive yourself to the hospital. Summary  After the procedure, it is common to have bruising and tenderness at the catheter insertion area.  After the procedure, it is important to rest and drink  plenty of fluids.  Do not take baths, swim, or use a hot tub until your health care provider says it is okay to do so. You may shower 24-48 hours after the procedure or as told by your health care provider.  If the catheter site starts bleeding, lie flat and put pressure on the site. If the bleeding does not stop, get help right away. This is a medical emergency. This information is not intended to replace advice given to you by your health care provider. Make sure you discuss any questions you have with your health care provider. Document Revised: 08/23/2017 Document Reviewed: 08/15/2016 Elsevier Patient Education  2020 [employer].

## 2020-08-01 NOTE — Interval H&P Note (Signed)
History and Physical Interval Note:  08/01/2020 9:01 AM  [name]  has presented today for surgery, with the diagnosis of pad.  The various methods of treatment have been discussed with the patient and family. After consideration of risks, benefits and other options for treatment, the patient has consented to  Procedure(s): ABDOMINAL AORTOGRAM W/LOWER EXTREMITY (N/A) as a surgical intervention.  The patient's history has been reviewed, patient examined, no change in status, stable for surgery.  I have reviewed the patient's chart and labs.  Questions were answered to the patient's satisfaction.     [name]

## 2020-08-02 ENCOUNTER — Other Ambulatory Visit: Payer: Self-pay

## 2020-08-02 MED FILL — Lidocaine HCl Local Preservative Free (PF) Inj 1%: INTRAMUSCULAR | Qty: 30 | Status: AC

## 2020-08-04 ENCOUNTER — Other Ambulatory Visit: Payer: Self-pay

## 2020-08-04 ENCOUNTER — Encounter (HOSPITAL_BASED_OUTPATIENT_CLINIC_OR_DEPARTMENT_OTHER): Payer: Medicare Other | Attending: Internal Medicine | Admitting: Internal Medicine

## 2020-08-04 ENCOUNTER — Encounter (HOSPITAL_COMMUNITY): Payer: Self-pay | Admitting: Cardiovascular Disease

## 2020-08-04 DIAGNOSIS — L97218 Non-pressure chronic ulcer of right calf with other specified severity: Secondary | ICD-10-CM | POA: Insufficient documentation

## 2020-08-04 DIAGNOSIS — I70211 Atherosclerosis of native arteries of extremities with intermittent claudication, right leg: Secondary | ICD-10-CM | POA: Diagnosis not present

## 2020-08-04 DIAGNOSIS — I252 Old myocardial infarction: Secondary | ICD-10-CM | POA: Insufficient documentation

## 2020-08-04 DIAGNOSIS — E1152 Type 2 diabetes mellitus with diabetic peripheral angiopathy with gangrene: Secondary | ICD-10-CM | POA: Insufficient documentation

## 2020-08-04 DIAGNOSIS — I2510 Atherosclerotic heart disease of native coronary artery without angina pectoris: Secondary | ICD-10-CM | POA: Insufficient documentation

## 2020-08-04 DIAGNOSIS — Z881 Allergy status to other antibiotic agents status: Secondary | ICD-10-CM | POA: Insufficient documentation

## 2020-08-04 DIAGNOSIS — I10 Essential (primary) hypertension: Secondary | ICD-10-CM | POA: Insufficient documentation

## 2020-08-04 DIAGNOSIS — L97812 Non-pressure chronic ulcer of other part of right lower leg with fat layer exposed: Secondary | ICD-10-CM | POA: Diagnosis not present

## 2020-08-04 DIAGNOSIS — Z951 Presence of aortocoronary bypass graft: Secondary | ICD-10-CM | POA: Insufficient documentation

## 2020-08-04 DIAGNOSIS — L97518 Non-pressure chronic ulcer of other part of right foot with other specified severity: Secondary | ICD-10-CM | POA: Insufficient documentation

## 2020-08-04 DIAGNOSIS — Z885 Allergy status to narcotic agent status: Secondary | ICD-10-CM | POA: Diagnosis not present

## 2020-08-04 DIAGNOSIS — I771 Stricture of artery: Secondary | ICD-10-CM | POA: Insufficient documentation

## 2020-08-04 DIAGNOSIS — Z8249 Family history of ischemic heart disease and other diseases of the circulatory system: Secondary | ICD-10-CM | POA: Diagnosis not present

## 2020-08-04 DIAGNOSIS — Z87891 Personal history of nicotine dependence: Secondary | ICD-10-CM | POA: Insufficient documentation

## 2020-08-04 DIAGNOSIS — E11319 Type 2 diabetes mellitus with unspecified diabetic retinopathy without macular edema: Secondary | ICD-10-CM | POA: Insufficient documentation

## 2020-08-04 DIAGNOSIS — Z86718 Personal history of other venous thrombosis and embolism: Secondary | ICD-10-CM | POA: Insufficient documentation

## 2020-08-04 DIAGNOSIS — E11621 Type 2 diabetes mellitus with foot ulcer: Secondary | ICD-10-CM | POA: Insufficient documentation

## 2020-08-04 DIAGNOSIS — Z809 Family history of malignant neoplasm, unspecified: Secondary | ICD-10-CM | POA: Diagnosis not present

## 2020-08-04 DIAGNOSIS — Z833 Family history of diabetes mellitus: Secondary | ICD-10-CM | POA: Diagnosis not present

## 2020-08-06 DIAGNOSIS — Z23 Encounter for immunization: Secondary | ICD-10-CM | POA: Diagnosis not present

## 2020-08-09 NOTE — Pre-Procedure Instructions (Addendum)
[place], [place] - [address] [place] [place] [address] Phone: [phone] Fax: [phone]      Your procedure is scheduled on Tuesday, November 23rd at 39:30a.m.  Report to [facility] Main Entrance "A" at 5:30 A.M., and check in at the Admitting office.  Call this number if you have problems the morning of surgery:  [phone]  Call [phone] if you have any questions prior to your surgery date Monday-Friday 8am-4pm    Remember:  Do not eat after midnight the night before your surgery    Take these medicines the morning of surgery with A SIP OF WATER  metoprolol tartrate (LOPRESSOR)  acetaminophen (TYLENOL)-as needed Systane eye drops- as needed Aspirin  Follow your surgeon's instructions on when to stop clopidogrel (PLAVIX).  If no instructions were given by your surgeon then you will need to call the office to get those instructions.    As of today, STOP taking any Aleve, Naproxen, Ibuprofen, Motrin, Advil, Goody's, BC's, all herbal medications, fish oil, and all vitamins.   WHAT DO I DO ABOUT MY DIABETES MEDICATION?   . DO NOT TAKE canagliflozin (INVOKANA) the day before (11/22) and the day of surgery (11/23).   . DO NOT TAKE glipiZIDE (GLUCOTROL XL) or metFORMIN (GLUCOPHAGE) the day of surgery.    HOW TO MANAGE YOUR DIABETES BEFORE AND AFTER SURGERY  Why is it important to control my blood sugar before and after surgery? . Improving blood sugar levels before and after surgery helps healing and can limit problems. . A way of improving blood sugar control is eating a healthy diet by: o  Eating less sugar and carbohydrates o  Increasing activity/exercise o  Talking with your doctor about reaching your blood sugar goals . High blood sugars (greater than 180 mg/dL) can raise your risk of infections and slow your recovery, so you will need to focus on controlling your diabetes during the weeks before  surgery. . Make sure that the doctor who takes care of your diabetes knows about your planned surgery including the date and location.  How do I manage my blood sugar before surgery? . Check your blood sugar at least 4 times a day, starting 2 days before surgery, to make sure that the level is not too high or low. . Check your blood sugar the morning of your surgery when you wake up and every 2 hours until you get to the Short Stay unit. o If your blood sugar is less than 70 mg/dL, you will need to treat for low blood sugar: - Do not take insulin. - Treat a low blood sugar (less than 70 mg/dL) with  cup of clear juice (cranberry or apple), 4 glucose tablets, OR glucose gel. - Recheck blood sugar in 15 minutes after treatment (to make sure it is greater than 70 mg/dL). If your blood sugar is not greater than 70 mg/dL on recheck, call [phone] for further instructions. . Report your blood sugar to the short stay nurse when you get to Short Stay.  . If you are admitted to the hospital after surgery: o Your blood sugar will be checked by the staff and you will probably be given insulin after surgery (instead of oral diabetes medicines) to make sure you have good blood sugar levels. o The goal for blood sugar control after surgery is 80-180 mg/dL.              Do not wear jewelry, make up, or  nail polish            Do not wear lotions, powders, perfumes, or deodorant.            Do not shave 48 hours prior to surgery.            Do not bring valuables to the hospital.            [facility] is not responsible for any belongings or valuables.  Do NOT Smoke (Tobacco/Vaping) or drink Alcohol 24 hours prior to your procedure If you use a CPAP at night, you may bring all equipment for your overnight stay.   Contacts, glasses, dentures or bridgework may not be worn into surgery.      For patients admitted to the hospital, discharge time will be determined by your treatment team.   Patients  discharged the day of surgery will not be allowed to drive home, and someone needs to stay with them for 24 hours.    Special instructions:   [facility]- Preparing For Surgery  Before surgery, you can play an important role. Because skin is not sterile, your skin needs to be as free of germs as possible. You can reduce the number of germs on your skin by washing with CHG (chlorahexidine gluconate) Soap before surgery.  CHG is an antiseptic cleaner which kills germs and bonds with the skin to continue killing germs even after washing.    Oral Hygiene is also important to reduce your risk of infection.  Remember - BRUSH YOUR TEETH THE MORNING OF SURGERY WITH YOUR REGULAR TOOTHPASTE  Please do not use if you have an allergy to CHG or antibacterial soaps. If your skin becomes reddened/irritated stop using the CHG.  Do not shave (including legs and underarms) for at least 48 hours prior to first CHG shower. It is OK to shave your face.  Please follow these instructions carefully.   1. Shower the NIGHT BEFORE SURGERY and the MORNING OF SURGERY with CHG Soap.   2. If you chose to wash your hair, wash your hair first as usual with your normal shampoo.  3. After you shampoo, rinse your hair and body thoroughly to remove the shampoo.  4. Use CHG as you would any other liquid soap. You can apply CHG directly to the skin and wash gently with a scrungie or a clean washcloth.   5. Apply the CHG Soap to your body ONLY FROM THE NECK DOWN.  Do not use on open wounds or open sores. Avoid contact with your eyes, ears, mouth and genitals (private parts). Wash Face and genitals (private parts)  with your normal soap.   6. Wash thoroughly, paying special attention to the area where your surgery will be performed.  7. Thoroughly rinse your body with warm water from the neck down.  8. DO NOT shower/wash with your normal soap after using and rinsing off the CHG Soap.  9. Pat yourself dry with a CLEAN  TOWEL.  10. Wear CLEAN PAJAMAS to bed the night before surgery  11. Place CLEAN SHEETS on your bed the night of your first shower and DO NOT SLEEP WITH PETS.   Day of Surgery: Shower with CHG soap as stated above. Wear Clean/Comfortable clothing the morning of surgery Do not apply any deodorants/lotions.   Remember to brush your teeth WITH YOUR REGULAR TOOTHPASTE.   Please read over the following fact sheets that you were given.

## 2020-08-09 NOTE — Progress Notes (Signed)
[name], [name] ([id number]) Visit Report for 08/04/2020 Allergy List Details Patient Name: Date of Service: [name] 08/04/2020 9:00 A M Medical Record Number: [id number] Patient Account Number: [id number] Date of Birth/Sex: Treating RN: [name] (79 y.o. [name] Primary Care [name]: [name] Other Clinician: Referring [name]: Treating [name]/[name]: [name] in Treatment: 0 Allergies Active Allergies Demerol scopolamine Bactrim Reaction: increased K+ Versed Reaction: agitation Allergy Notes Electronic Signature(s) Signed: 08/08/2020 5:51:53 PM By: [name] Hurst RN, BSN Entered By: [name] on 08/04/2020 09:06:04 -------------------------------------------------------------------------------- Arrival Information Details Patient Name: Date of Service: [name], [place]. 08/04/2020 9:00 A M Medical Record Number: [id number] Patient Account Number: [id number] Date of Birth/Sex: Treating RN: [name] (79 y.o. [name] Primary Care [name]: [name] Other Clinician: Referring [name]: Treating [name]/[name]: [name] in Treatment: 0 Visit Information Patient Arrived: Ambulatory Arrival Time: 08:57 Accompanied By: self Transfer Assistance: None Patient Identification Verified: Yes Secondary Verification Process Completed: Yes Patient Has Alerts: Yes Patient Alerts: R ABI: 0.98 History Since Last Visit Added or deleted any medications: No Any new allergies or adverse reactions: No Had a fall or experienced change in activities of daily living that may affect risk of falls: No Signs or symptoms of abuse/neglect since last visito No Hospitalized since last visit: No Implantable device outside of the clinic excluding cellular tissue based products placed in the center since last visit: No Has Dressing in Place as Prescribed: Yes Electronic  Signature(s) Signed: 08/08/2020 5:51:53 PM By: [name] Hurst RN, BSN Entered By: [name] on 08/04/2020 09:22:55 -------------------------------------------------------------------------------- Clinic Level of Care Assessment Details Patient Name: Date of Service: [name], [name] 08/04/2020 9:00 A M Medical Record Number: [id number] Patient Account Number: [id number] Date of Birth/Sex: Treating RN: [name] (79 y.o. [name], [name] Primary Care [name]: [name] Other Clinician: Referring [name]: Treating [name]/[name]: [name] in Treatment: 0 Clinic Level of Care Assessment Items TOOL 1 Quantity Score X- 1 0 Use when EandM and Procedure is performed on INITIAL visit ASSESSMENTS - Nursing Assessment / Reassessment X- 1 20 General Physical Exam (combine w/ comprehensive assessment (listed just below) when performed on new pt. evals) X- 1 25 Comprehensive Assessment (HX, ROS, Risk Assessments, Wounds Hx, etc.) ASSESSMENTS - Wound and Skin Assessment / Reassessment X- 1 10 Dermatologic / Skin Assessment (not related to wound area) ASSESSMENTS - Ostomy and/or Continence Assessment and Care []  - 0 Incontinence Assessment and Management []  - 0 Ostomy Care Assessment and Management (repouching, etc.) PROCESS - Coordination of Care []  - 0 Simple Patient / Family Education for ongoing care X- 1 20 Complex (extensive) Patient / Family Education for ongoing care X- 1 10 Staff obtains Programmer, systems, Records, T Results / Process Orders est []  - 0 Staff telephones HHA, Nursing Homes / Clarify orders / etc []  - 0 Routine Transfer to another Facility (non-emergent condition) []  - 0 Routine Hospital Admission (non-emergent condition) X- 1 15 New Admissions / Biomedical engineer / Ordering NPWT Apligraf, etc. , []  - 0 Emergency Hospital Admission (emergent condition) PROCESS - Special Needs []  - 0 Pediatric / Minor  Patient Management []  - 0 Isolation Patient Management []  - 0 Hearing / Language / Visual special needs []  - 0 Assessment of Community assistance (transportation, D/C planning, etc.) []  - 0 Additional assistance / Altered mentation []  - 0 Support Surface(s) Assessment (bed, cushion, seat, etc.) INTERVENTIONS - Miscellaneous []  - 0 External  ear exam []  - 0 Patient Transfer (multiple staff / Civil Service fast streamer / Similar devices) []  - 0 Simple Staple / Suture removal (25 or less) []  - 0 Complex Staple / Suture removal (26 or more) []  - 0 Hypo/Hyperglycemic Management (do not check if billed separately) []  - 0 Ankle / Brachial Index (ABI) - do not check if billed separately Has the patient been seen at the hospital within the last three years: Yes Total Score: 100 Level Of Care: New/Established - Level 3 Electronic Signature(s) Signed: 08/04/2020 5:09:44 PM By: [name] Entered By: [name] on 08/04/2020 09:34:48 -------------------------------------------------------------------------------- Encounter Discharge Information Details Patient Name: Date of Service: [name]. 08/04/2020 9:00 A M Medical Record Number: [id number] Patient Account Number: [id number] Date of Birth/Sex: Treating RN: [name] (79 y.o. [name] Primary Care [name]: [name] Other Clinician: Referring [name]: Treating [name]/[name]: [name] in Treatment: 0 Encounter Discharge Information Items Post Procedure Vitals Discharge Condition: Stable Temperature (F): 98 Ambulatory Status: Ambulatory Pulse (bpm): 102 Discharge Destination: Home Respiratory Rate (breaths/min): 18 Transportation: Private Auto Blood Pressure (mmHg): 162/78 Accompanied By: self Schedule Follow-up Appointment: Yes Clinical Summary of Care: Patient Declined Electronic Signature(s) Signed: 08/04/2020 5:21:57 PM By: Baruch Gouty RN, BSN Entered By:  Baruch Gouty on 08/04/2020 10:00:56 -------------------------------------------------------------------------------- Lower Extremity Assessment Details Patient Name: Date of Service: [name], [name] 08/04/2020 9:00 A M Medical Record Number: [id number] Patient Account Number: [id number] Date of Birth/Sex: Treating RN: [birth year] (79 y.o. [name] Primary Care Ladashia Demarinis: [name] Other Clinician: Referring Trace Wirick: Treating Deandra Goering/[name]: [name] in Treatment: 0 Edema Assessment Assessed: Shirlyn Goltz: No] Patrice Paradise: No] Edema: [Left: N] [Right: o] Calf Left: Right: Point of Measurement: 28 cm From Medial Instep 29 cm Ankle Left: Right: Point of Measurement: 9 cm From Medial Instep 18.5 cm Vascular Assessment Pulses: Dorsalis Pedis Palpable: [Right:No] Electronic Signature(s) Signed: 08/08/2020 5:51:53 PM By: [name] Hurst RN, BSN Entered By: [name] on 08/04/2020 09:22:20 -------------------------------------------------------------------------------- Multi Wound Chart Details Patient Name: Date of Service: [name], [place]. 08/04/2020 9:00 A M Medical Record Number: [id number] Patient Account Number: [id number] Date of Birth/Sex: Treating RN: [birth year] [name] (79 y.o. 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] [N/A:N/A] Primary  Etiology: [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049] [12:0.118] [N/A:N/A] Volume (cm) : [11:Grade 2] [12:Unclassifiable] [N/A:N/A] Classification: [11:Medium] [12:Medium] [N/A:N/A] Exudate A mount: [11:Serosanguineous] [12:Serosanguineous] [N/A:N/A] Exudate Type: [11:red, brown] [12:red, brown] [N/A:N/A] Exudate Color: [11:Flat and Intact] [12:Flat and Intact] [N/A:N/A] Wound Margin: [11:Medium (34-66%)] [12:None Present (0%)] [N/A:N/A] Granulation A mount: [11:Pink, Pale] [12:N/A] [N/A:N/A] Granulation Quality: [11:Medium (34-66%)] [12:Large (67-100%)] [N/A:N/A] Necrotic A mount: [11:Fat Layer (Subcutaneous Tissue): Yes Fascia: No] [N/A:N/A]  Exposed Structures: [11:Fascia: No Tendon: No Muscle: No Joint: No Bone: No None] [12:Fat Layer (Subcutaneous Tissue): No Tendon: No Muscle: No Joint: No Bone: No None] [N/A:N/A] Epithelialization: [11:Chemical/Enzymatic/Mechanical] [12:Debridement - Selective/Open Wound] [N/A:N/A] Debridement: Pre-procedure Verification/Time Out 09:30 [12:09:30] [N/A:N/A] Taken: [11:Lidocaine 4% Topical Solution] [12:Lidocaine 4% Topical Solution] [N/A:N/A] Pain Control: [11:N/A] [12:Slough] [N/A:N/A] Tissue Debrided: [11:N/A] [12:Skin/Epidermis] [N/A:N/A] Level: [11:N/A] [12:1.5] [N/A:N/A] Debridement A (sq cm): [11:rea N/A] [12:Curette] [N/A:N/A] Instrument: [11:None]  [12:Minimum] [N/A:N/A] Bleeding: [11:N/A] [12:Pressure] [N/A:N/A] Hemostasis A chieved: [11:N/A] [12:0] [N/A:N/A] Procedural Pain: [11:N/A] [12:0] [N/A:N/A] Post Procedural Pain: [11:Procedure was tolerated well] [12:Procedure was tolerated well] [N/A:N/A] Debridement Treatment Response: [11:0.3x0.7x0.3] [12:1.5x1x0.2] [N/A:N/A] Post Debridement Measurements L x W x D (cm) [11:0.049] [12:0.236] [N/A:N/A] Post Debridement Volume: (cm) [11:Debridement] [12:Debridement] [N/A:N/A] Procedures Performed: Treatment Notes 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 Periwound Care Skin Prep 3. Primary Dressing Applied Iodoflex 4. Secondary Dressing Foam Border Dressing 5. Secured With Tape] Wound #12 (Right, Medial Lower Leg) 2. Periwound Care Skin Prep 3. Primary Dressing Applied Iodoflex 4. Secondary Dressing Foam Border Dressing 5. Secured With Recruitment consultant) Signed: 08/04/2020 5:00:15 PM By: [name] MD Signed: 08/04/2020 5:09:44 PM By: [name] Entered By: [name] on 08/04/2020 10:39:33 -------------------------------------------------------------------------------- Multi-Disciplinary Care Plan Details Patient Name: Date of Service: [name], [name] 08/04/2020 9:00 A M Medical Record Number: [id number] Patient Account Number: [id number] Date of Birth/Sex: Treating RN: [birth year] (79 y.o. [name], [name] Primary Care Yoland Scherr: [name] Other Clinician: Referring Jenya Putz: Treating Martasia Talamante/[name]: [name] in Treatment: 0 Active Inactive Nutrition Nursing Diagnoses: Potential for alteratiion in Nutrition/Potential for imbalanced nutrition Goals: Patient/caregiver agrees to and verbalizes understanding of need to obtain nutritional consultation Date Initiated: 08/04/2020 Target Resolution Date: 09/02/2020 Goal Status: Active Interventions: Assess HgA1c results as ordered  upon admission and as needed Provide education on elevated blood sugars and impact on wound healing Provide education on nutrition Treatment Activities: Obtain HgA1c : 08/04/2020 Patient referred to Primary Care Physician for further nutritional evaluation : 08/04/2020 Notes: Orientation to the Wound Care Program Nursing Diagnoses: Knowledge deficit related to the wound healing center program Goals: Patient/caregiver will verbalize understanding of the [place] Date Initiated: 08/04/2020 Target Resolution Date: 09/02/2020 Goal Status: Active Interventions: Provide education on orientation to the wound center Notes: Tissue Oxygenation Nursing Diagnoses: Potential alteration in peripheral tissue perfusion (select prior to confirmation of diagnosis) Goals: Invasive arterial studies completed as ordered Date Initiated: 08/04/2020 Target Resolution Date: 08/16/2020 Goal Status: Active Patient/caregiver will verbalize understanding of disease process and disease management Date Initiated: 08/04/2020 Target Resolution Date: 09/01/2020 Goal Status: Active Interventions: Assess patient understanding of disease process and management upon diagnosis and as needed Assess peripheral arterial status upon admission and as needed Provide education on tissue oxygenation and ischemia Treatment Activities: Invasive vascular studies : 08/04/2020 Revascularization procedures : 08/04/2020 Notes: Electronic Signature(s) Signed: 08/04/2020 5:09:44 PM By: [name] Entered By: [name] on 08/04/2020 09:33:17 -------------------------------------------------------------------------------- Pain Assessment Details Patient Name: Date of Service: [name] 08/04/2020 9:00 A M Medical Record Number: [id number] Patient Account Number: [id number] Date of Birth/Sex: Treating RN: [birth year] [name] (79 y.o. [name] Primary Care Ronnie Mallette: [name] Other  Clinician: Referring Lyndon Chenoweth: Treating Amiir Heckard/[name]: [name] in Treatment: 0 Active Problems Location of Pain Severity and Description of Pain Patient Has Paino No Site Locations Pain Management and Medication Current Pain Management: Electronic Signature(s) Signed: 08/08/2020 5:51:53 PM By: [name] Hurst  RN, BSN Entered By: [name] on 08/04/2020 09:19:28 -------------------------------------------------------------------------------- Patient/Caregiver Education Details Patient Name: Date of Service: [name] 11/11/2021andnbsp9:00 [place] Record Number: [id number] Patient Account Number: [id number] Date of Birth/Gender: Treating RN: [name] (79 y.o. [name] Primary Care Physician: [name] Other Clinician: Referring Physician: Treating Physician/[name]: [name] in Treatment: 0 Education Assessment Education Provided To: Patient Education Topics Provided Tissue Oxygenation: Handouts: Peripheral Arterial Disease and Related Ulcers Methods: Explain/Verbal Responses: Reinforcements needed [place]: o Handouts: Welcome T [facility] [place] o Methods: Explain/Verbal Responses: Reinforcements needed Electronic Signature(s) Signed: 08/04/2020 5:09:44 PM By: [name] Entered By: [name] on 08/04/2020 09:34:07 -------------------------------------------------------------------------------- Wound Assessment Details Patient Name: Date of Service: [name] 08/04/2020 9:00 A M Medical Record Number: [id number] Patient Account Number: [id number] Date of Birth/Sex: Treating RN: [name] (79 y.o. 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 Wounding Event: Gradually Appeared Etiology: Date Acquired: 07/23/2020 Wound Open Weeks Of Treatment: 0 Status: Clustered Wound: No Comorbid Coronary Artery Disease, Deep Vein Thrombosis, Hypertension, History: Myocardial Infarction, Peripheral Arterial Disease, Type II Diabetes Photos Photo Uploaded By: [name] on 08/08/2020 10:53:04 Wound Measurements Length: (cm) 0.3 Width: (cm) 0.7 Depth: (cm) 0.3 Area: (cm) 0.165 Volume: (cm) 0.049 % Reduction in Area: % Reduction in Volume: Epithelialization: None Tunneling: No Undermining: No Wound Description Classification: Grade 2 Wound Margin: Flat and Intact Exudate Amount: Medium Exudate Type: Serosanguineous Exudate Color: red, brown Foul Odor After Cleansing: No Slough/Fibrino Yes Wound Bed Granulation Amount: Medium (34-66%) Exposed Structure Granulation Quality: Pink, Pale Fascia Exposed: No Necrotic Amount: Medium (34-66%) Fat Layer (Subcutaneous Tissue) Exposed: Yes Necrotic Quality: Adherent Slough Tendon Exposed: No Muscle Exposed: No Joint Exposed: No Bone Exposed: No Treatment Notes 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 Periwound Care Skin Prep 3. Primary Dressing Applied Iodoflex 4. Secondary Dressing Foam Border Dressing 5. Secured With Recruitment consultant) Signed: 08/08/2020 5:51:53 PM By: [name] Hurst RN, BSN Entered By: [name] on 08/04/2020 09:18:13 -------------------------------------------------------------------------------- Wound Assessment Details Patient Name: Date of Service: [name], [name] 08/04/2020 9:00 A M Medical Record Number: [id number] Patient Account Number: [id number] Date of Birth/Sex: Treating RN: [name] (79 y.o. [name] Primary Care  Bentlee Drier: [name] Other Clinician: Referring Klay Sobotka: Treating Fonda Rochon/[name]: [name] in Treatment: 0 Wound Status Wound Number: 12 Primary Arterial Insufficiency Ulcer Etiology: Wound Location: Right, Medial Lower Leg Wound Open Wounding Event: Surgical Injury Status: Date Acquired: 03/24/2020 Comorbid Coronary Artery Disease, Deep Vein Thrombosis, Hypertension, Weeks Of Treatment: 0 History: Myocardial Infarction, Peripheral Arterial Disease, Type II Diabetes Clustered Wound: No Photos Photo Uploaded By: [name] on 08/04/2020 13:59:03 Wound Measurements Length: (cm) 1.5 Width: (cm) 1 Depth: (cm) 0.1 Area: (cm) 1.178 Volume: (cm) 0.118 % Reduction in Area: % Reduction in Volume: Epithelialization: None Tunneling: No Undermining: No Wound Description Classification: Unclassifiable Wound Margin: Flat and Intact Exudate Amount: Medium Exudate Type: Serosanguineous Exudate Color: red, brown Foul Odor After Cleansing: No Slough/Fibrino Yes Wound Bed Granulation Amount: None Present (0%) Exposed Structure Necrotic Amount: Large (67-100%) Fascia Exposed: No Necrotic Quality: Adherent Slough Fat Layer (Subcutaneous Tissue) Exposed: No Tendon Exposed: No Muscle Exposed:  No Joint Exposed: No Bone Exposed: No Treatment Notes Wound #12 (Right, Medial Lower Leg) 2. Periwound Care Skin Prep 3. Primary Dressing Applied Iodoflex 4. Secondary Dressing Foam Border Dressing 5. Secured With Recruitment consultant) Signed: 08/08/2020 5:51:53 PM By: [name] Hurst RN, BSN Entered By: [name] on 08/04/2020 09:19:13 -------------------------------------------------------------------------------- Vitals Details Patient Name: Date of Service: [name], [place]. 08/04/2020 9:00 A M Medical Record Number: [id number] Patient Account Number: [id number] Date of Birth/Sex: Treating RN: [birth year][name] (79 y.o. [name] Primary Care [name]: [name] Other Clinician: Referring Sharif Rendell: Treating Kada Friesen/[name]: [name] in Treatment: 0 Vital Signs Time Taken: 09:02 Temperature (F): 98.0 Pulse (bpm): 102 Respiratory Rate (breaths/min): 18 Blood Pressure (mmHg): 162/78 Reference Range: 80 - 120 mg / dl Electronic Signature(s) Signed: 08/04/2020 1:06:40 PM By: Sandre Kitty Entered By: Sandre Kitty on 08/04/2020 09:02:56

## 2020-08-09 NOTE — Progress Notes (Signed)
[name], [name] ([id number]) Visit Report for 08/04/2020 Chief Complaint Document Details Patient Name: Date of Service: [name] 08/04/2020 9:00 A M Medical Record Number: [id number] Patient Account Number: [id number] Date of Birth/Sex: Treating RN: 1941/08/13 (79 y.o. [name], [name] Primary Care Provider: [name] Other Clinician: Referring Provider: Treating Provider/[name]: Tressie Stalker in Treatment: 0 Information Obtained from: Patient Chief Complaint 07/13/2019; patient is here for review wounds on the toes of her right foot and the left fourth toe 08/04/2020; patient is here for review of wounds at the anterior part of her right first met head/TMA site and another area on the right medial calf at a vein harvest site. Electronic Signature(s) Signed: 08/04/2020 5:00:15 PM By: [name] MD Entered By: [name] on 08/04/2020 10:40:07 -------------------------------------------------------------------------------- Debridement Details Patient Name: Date of Service: [name], [place]. 08/04/2020 9:00 A M Medical Record Number: [id number] Patient Account Number: [id number] Date of Birth/Sex: Treating RN: May 14, 1941 (79 y.o. [name] Primary Care Provider: [name] Other Clinician: Referring Provider: Treating Provider/[name]: Tressie Stalker in Treatment: 0 Debridement Performed for Assessment: Wound #12 Right,Medial Lower Leg Performed By: Physician Ricard Dillon., MD Debridement Type: Debridement Severity of Tissue Pre Debridement: Fat layer exposed Level of Consciousness (Pre-procedure): Awake and Alert Pre-procedure Verification/Time Out Yes - 09:30 Taken: Start Time: 09:31 Pain Control: Lidocaine 4% T opical Solution T Area Debrided (L x W): otal 1.5 (cm) x 1 (cm) = 1.5 (cm) Tissue and other material debrided: Viable, Slough, Skin: Dermis , Skin: Epidermis,  Fibrin/Exudate, Slough Level: Skin/Epidermis Debridement Description: Selective/Open Wound Instrument: Curette Bleeding: Minimum Hemostasis Achieved: Pressure End Time: 09:35 Procedural Pain: 0 Post Procedural Pain: 0 Response to Treatment: Procedure was tolerated well Level of Consciousness (Post- Awake and Alert procedure): Post Debridement Measurements of Total Wound Length: (cm) 1.5 Width: (cm) 1 Depth: (cm) 0.2 Volume: (cm) 0.236 Character of Wound/Ulcer Post Debridement: Requires Further Debridement Severity of Tissue Post Debridement: Fat layer exposed Post Procedure Diagnosis Same as Pre-procedure Electronic Signature(s) Signed: 08/04/2020 5:00:15 PM By: [name] MD Signed: 08/04/2020 5:09:44 PM By: Deon Pilling Entered By: Deon Pilling on 08/04/2020 09:35:56 -------------------------------------------------------------------------------- Debridement Details Patient Name: Date of Service: [name], [name] 08/04/2020 9:00 A M Medical Record Number: [id number] Patient Account Number: [id number] Date of Birth/Sex: Treating RN: Nov 06, 1940 (79 y.o. [name], [name] Primary Care Provider: [name] Other Clinician: Referring Provider: Treating Provider/[name]: Tressie Stalker in Treatment: 0 Debridement Performed for Assessment: Wound #11 Right,Medial Amputation Site - Transmetatarsal Performed By: Clinician [name], RN Debridement Type: Chemical/Enzymatic/Mechanical Agent Used: gauze and wound cleanser Severity of Tissue Pre Debridement: Fat layer exposed Level of Consciousness (Pre-procedure): Awake and Alert Pre-procedure Verification/Time Out Yes - 09:30 Taken: Start Time: 09:31 Pain Control: Lidocaine 4% Topical Solution Bleeding: None Response to Treatment: Procedure was tolerated well Level of Consciousness (Post- Awake and Alert procedure): Post Debridement Measurements of Total Wound Length: (cm)  0.3 Width: (cm) 0.7 Depth: (cm) 0.3 Volume: (cm) 0.049 Character of Wound/Ulcer Post Debridement: Requires Further Debridement Severity of Tissue Post Debridement: Fat layer exposed Post Procedure Diagnosis Same as Pre-procedure Electronic Signature(s) Signed: 08/04/2020 5:00:15 PM By: [name] MD Signed: 08/04/2020 5:09:44 PM By: Deon Pilling Entered By: Deon Pilling on 08/04/2020 10:25:58 -------------------------------------------------------------------------------- HPI Details Patient Name: Date of Service: [name], [place]. 08/04/2020 9:00 A M Medical Record Number: [id number] Patient Account Number: [id number] Date of Birth/Sex: Treating RN:  [birth year] (79 y.o. [name] Primary Care Provider: [name] Other Clinician: Referring Provider: Treating Provider/[name]: Tressie Stalker in Treatment: 0 History of Present Illness HPI Description: ADMISSION 07/13/2019 Patient is a 79 year old type II diabetic. She has known PAD. She has been followed by Dr. Jacqualyn Posey of podiatry for blistering areas on her toes dating back to 04/30/2019 which at this was the left fourth toe. By 8/11 she had wounds on the right and left second toes. She underwent arterial studies by Dr. Alvester Chou on 7/31 that showed ABIs on the right of 0.60 TBI of 0.26 on the left ABI of 0.56 and a TBI of 0.25. By 9/10 she had ischemic-looking wounds per Dr. Earleen Newport on the right first, left first second and third. She has been using Medihoney and then mupirocin more recently simply Betadine. The patient underwent angiography by Dr. Gwenlyn Found on 9/21. 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 Noted that there was 1 vessel runoff in the foot via the peroneal. On the left there was an 80% left SFA, 70% mid left SFA. There was a short segment calcified CTO distal  less than SFA and above-knee popliteal artery reconstituting with two-vessel runoff. 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 It was felt that she had bilateral SFA disease as well as tibial vessel disease. An attempt was made to revascularize the left SFA but they were unable to cross because of the highly calcified nature of the lesion. 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 She has an area on the medial left fourth toe with raised horned callus on top of this. I am not certain what this represents. With regards to pain she has about a 2-1/2 I will claudication tolerance in the grocery store. She has some pain in night which is relieved by putting her feet down over the side of the bed. She is wearing Nitro-Dur patches on the top of her right foot. Past medical history includes type 2 diabetes with secondary PAD, neoplasm of the skin, diabetic retinopathy, carotid artery stenosis, hyperlipidemia hypertension. 11/2; the last time the patient was here I spoke to Dr. Gwenlyn Found about revascularization options on the right. As I understand things currently Dr. Gwenlyn Found spoke with Dr. Trula Slade and ultimately the patient was taken to surgery on 10/27. She had a right iliofemoral endarterectomy with a bovine patch angioplasty. I think the plan now is for her to have a staged intervention on the right SFA by Dr. Alvester Chou. Per the patient's understanding Dr. Gwenlyn Found and Dr. Jacqualyn Posey are waiting to see when the gangrenous toes on the right foot can be amputated. The patient states her pain is a lot better and she is grateful for that. 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 Small area on the left fourth toe. She is using Betadine to the mummified areas on the right and Medihoney on the left 08/27/2019; the last time I saw this patient I discharged her from  the clinic. She had been revascularized by Dr. Trula Slade and she had a right iliofemoral endarterectomy with a bovine angioplasty. 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 Jacqualyn Posey of podiatry on 08/07/2019. I note that  she also had a intervention by Dr. Gwenlyn Found and he performed a directional atherectomy and drug-coated balloon angioplasty of the SFA and popliteal artery on the right. I am not certain of the date of Dr. Kennon Holter procedure as of the time of this dictation. She was referred back to [place] by Dr. Earleen Newport predominantly for follow-up of the left fourth toe. She still has sutures and stitches in the right TMA site. She states her pain is a lot better. She expresses concern about the condition of the amputation site at the TMA. She is on doxycycline I think prescribed by Dr. Earleen Newport. She is complaining of some pain at night 12/10; I spoke to Dr. Jacqualyn Posey last week. He removed the sutures on the right foot on Monday of this week. She has the area on the left fourth toe just proximal to the PIP and then the right TMA site. She is still on doxycycline and has enough through next week. Unfortunately the TMA site does not look good at all. Both on the lateral and medial part of the incisions are areas that probe to bone. There is purulence over the medial part which I have cultured. We will use silver alginate. Left fourth toe looks somewhat better but there was still exposed bone 12/17; patient has an MRI booked for 12/30. Culture I did last week showed Pseudomonas Serratia and Enterococcus. This was purulent drainage coming out of the medial part of her amputation site. I use cefdinir 300 twice daily for 10 days that started on 12/15. Her x-ray on the right showed limited evaluation for osteomyelitis. The findings could have been postoperative. There was subtle erosion in the distal first and distal fifth metatarsal. An MRI was suggested. On the left she had  irregularity of the fourth middle and proximal phalanx consistent with a history of osteomyelitis. I do not know that she has a history of osteomyelitis in this area. She had a newly defined area on the plantar third toe 12/31; patient's MRI is listed below: MRI OF THE RIGHT FOREFOOT WITHOUT AND WITH CONTRAST TECHNIQUE: Multiplanar, multisequence MR imaging of the right forefoot was performed before and after the administration of intravenous contrast. CONTRAST 6 mL GADAVIST IV SOLN : COMPARISON: Plain films right foot 09/04/2019. FINDINGS: Bones/Joint/Cartilage The patient is status post transmetatarsal amputation as seen on the prior exam. Marrow edema and enhancement are seen in all of the distal metatarsals. In the first metatarsal, signal change extends 1.5 cm proximal to the stump and in the second metatarsal extends approximately 2 cm proximal to the stump. Edema and enhancement are seen in only the distal 0.7 cm of the third metatarsal stump and tips of the fourth and fifth metatarsals. Bone marrow signal is otherwise unremarkable. A small focus of subchondral edema is seen in the lateral talus, likely degenerative. Ligaments Intact. Muscles and Tendons No intramuscular fluid collection. Soft tissues Skin ulceration is seen off the stump of the first metatarsal. A thin fluid collection tracks deep to the wound and over the anterior metatarsals worrisome for small abscess. Intense subcutaneous edema and enhancement are seen diffusely. 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 Cellulitis about the foot. Skin ulceration over the distal first metatarsal is identified with a thin fluid collection tracking anteriorly along the stump worrisome for abscess. Small focus of subchondral edema in the lateral talus is  likely degenerative. Electronically Signed By: Marcello Moores  Dalessio M.D. On: 09/23/2019 15:25 Patient arrives in clinic today not complaining any of any pain. 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 She does not describe claudication however her activity is very limited. 10/01/2019; since the patient was last here I have communicated with Dr. Gwenlyn Found after bypass by Dr. Trula Slade and addressing the right superficial femoral artery he states that she is widely patent through peroneal arteries to the ankle with collaterals to the dorsalis pedis. He states he is going to talk to colleagues about the feasibility of tibial pedal access. The patient seems infectious disease later this afternoon Dr. Baxter Flattery. in preparation for this she has been off antibiotics for 1 week and I went ahead and obtain pieces of the remanence of her first metatarsal for pathology and CandS. The patient is a candidate for hyperbaric oxygen. 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 1/15; considerable improvement in the wounds on her feet. We are using silver collagen. She follows with Dr. Baxter Flattery of infectious disease and is on meropenem and daptomycin. She has been taught how to do this herself at home. Follows with Dr. Graylon Good at the end of treatment here. She has 2 wounds on the surgical TMA site 1 lateral and 1 medial the lateral 1 has regressed tremendously. The area medially still has some exposed bone although the base of this looks healthy. 1/22; 2 separate wounds on the surgical TMA site. Both of these look satisfactory. The medial area does not have any exposed bone. This is an improvement On the left side fourth toe dorsally over the proximal phalanx there is a deep punched out area that probes to bone. She has an area on the tip of the left third toe. She also tells [place] that in HBO she traumatized her  left Achilles and this is left her with a superficial wound area 1/28; weekly visit along with HBO. She has 5 wounds. T our punched out areas on the original TMA site on the right. Both of these appear to have contracted. o The area on the right no longer has exposed bone. She has an area on the tip of the left third toe and on the DIP of the left fourth toe. Both of these had surface callus that I removed and unfortunately they have small areas that both go to bone. She has a traumatic wound on the left Achilles area that happened in HBO and that appears better. She is tolerating her IV antibiotics well at home. She has home health changing the dressings and she is doing it once on the weekends. We have been using silver collagen. She has been extensively revascularized on the right by Dr. Trula Slade and subsequently by Dr. Gwenlyn Found. 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 Baxter Flattery of infectious disease on 2/17. Dr. Gwenlyn Found on 2/23 and according to her her antibiotics stopped on 2/23. She is tolerating hyperbarics well. She has made a tremendous improvement in the right forefoot with only 2 smaller open wounds. Using silver collagen. On the left foot she has the area on the tip of the left third toe and the medial part of the left fourth toe. These had exposed bone last week I did not sense any of that today 2/12;  sees Dr. Graylon Good on 2/17 and Dr. Gwenlyn Found on 2/23. We are going to use Dermagraft on this today however the lateral part of her train TMA incision on the right is healed and the medial part is down so much that we just continue with the silver collagen She has wounds on the tip of her left third and the medial aspect of the left fourth. Both of these still have exposed bone I have not been able to get these to epithelialize. 2/19; she sees Dr. Gwenlyn Found on 2/23 and I have  communicated with him about the left vascular supply. Looking at her angiogram from 08/03/2019 it looks as though that they could not cross the left CFA. Noted that she had one-vessel runoff bilaterally. She also apparently saw Dr. Graylon Good although I did not look up these results for preparation of this record. 2/26; Dr. Gwenlyn Found is going to do an angiogram next week on Wednesday. I think they are going to try to go at this both retrograde and anterograde to see if anything can be done to revascularize the left lower limb. She continues to make nice progress on the remaining wound on the right medial foot on her TMA site and the toe it wounds on the left are responding nicely as well 3/12; the patient underwent an extensive and complicated revascularization on the left leg by Dr. Fletcher Anon on 3/3; she had an atherectomy on occluded left popliteal artery; anterior tibial artery followed by a balloon angioplasty. Atherectomy was also performed to the left SFA because there was still significant 50% stenosis in the left popliteal artery they performed an intravascular lithotripsy which improved the residual stenosis to 20%. The same lithotripsy was used to dilate the proximal left SFA. She had a drug-eluting stent placed I believe in the SFA. The patient returned for hyperbarics this week. She has had some eye problems on the right which she tells me are secondary to diabetic retinopathy and she saw her eye doctor. She is really made excellent progress there is no open wounds on the left foot at all. She has one open area medially and her TMA site on the right lateral wound is closed. 3/19; the patient comes in today with the area on the medial transmet on the right improved. Some debris removed from the surface revealed the still open wound. Unfortunately she now has the open area on the third toe tip and the medial aspect of the dorsal fourth toe. These are in the same location as her previous wounds. These  had actually closed up last week. 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 She is undergone complex revascularizations on the right by Dr. Trula Slade and Dr. Gwenlyn Found and I believe Dr. 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 She completed IVantibiotics as directed by infectious disease and I believe a course of oral antibiotics as well.she underwent a complete 40 treatments of hyperbarics. She comes in today with the vast majority of the right TMA site healed there is a small opening on the most medial aspect. Her left third and left fourth toes appear to have epithelialization although this has fluctuated somewhat. 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 She had a gaping wound  on the right transmetatarsal amputation L as well as deep wounds on the left third and left fourth toes. She underwent complex revascularizations on her bilateral lower legs which I described on her last visit. She completed IV antibiotics by infectious disease and 40 treatments of hyperbarics. Her left third and fourth toes are healed. The area on the right transmetatarsal amputation site is also closed today. READMISSION 08/04/2020 [name] is now a 79 year old woman that we had for a complex stay in our clinic from October 2000 14 January 2020. 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 Her current problems with wounds began on October 30 when she developed a spontaneous opening over the right transmet medial aspect over the first metatarsal head. She has been using collagen on this  that she had leftover from her last stay in this clinic. She also has had an open area on the medial right calf from a vein harvest site from her CABG she. She said that this is never really closed since the vein harvest was done. With regards to her arterial status this is a major issue. She had an angiogram by Dr. Gwenlyn Found in September 2020. She developed a gangrenous right first and second toes. Ultimately she was referred to Dr. Trula Slade who did a right common femoral endarterectomy with patch angioplasty. This was on 07/21/2019 and she really had a good result. Dr. Gwenlyn Found did a atherectomy followed by a drug coated balloon angioplasty of her right SFA popliteal and tibial peroneal trunk on 08/03/2019 again with excellent excellent results. Follow-up.Dopplers in November revealed revealed a widely patent SFA and tibioperoneal trunk. UNFORTUNATELY her recent angiogram that was done on 08/01/2020 showed an 80% right common femoral artery stenosis just above the previous endarterectomy site occluded right right SFA at the origin reconstituting the adductor canal by the profunda femoris collaterals. The profunda femoris is also diffusely diseased. There is and again another 80% tibioperoneal trunk stenosis and one-vessel runoff via the peroneal artery. She is seen Dr. Donzetta Matters and she is scheduled for a femoral endarterectomy and right femoral-popliteal bypass grafting with endarterectomy of her tibial peroneal trunk. The scheduled surgery is on 11/23 The patient does not complain of pain at rest. She does have 5-minute walking claudication. The wounds on her right first met head remanent was spontaneous she does not think she hit this. As mentioned in the area on her right medial calf is a vein harvest site Dr. Gwenlyn Found had texted me about getting her in the clinic and we have arranged this as soon as we can. I have told her that we will have to see how successful Dr. Donzetta Matters is before we know what can be done about  the wounds in a precise fashion. Fortunately there does not seem to be any obvious infection involving the right foot although that may need to be looked at if things really deteriorate. She was treated with IV antibiotics and hyperbaric oxygen for her previous osteomyelitis Electronic Signature(s) Signed: 08/04/2020 5:00:15 PM By: [name] MD Entered By: [name] on 08/04/2020 10:48:05 -------------------------------------------------------------------------------- Physical Exam Details Patient Name: Date of Service: [name], [place]. 08/04/2020 9:00 A M Medical Record Number: [id number] Patient Account Number: [id number] Date of Birth/Sex: Treating RN: December 15, 1940 (79 y.o. [name] Primary Care Provider: [name] Other Clinician: Referring Provider:  Treating Provider/[name]: Tressie Stalker in Treatment: 0 Constitutional Patient is hypertensive.. Pulse regular and within target range for patient.Marland Kitchen Respirations regular, non-labored and within target range.. Temperature is normal and within the target range for the patient.Marland Kitchen Appears in no distress. Respiratory work of breathing is normal. Cardiovascular Popliteal pulses absent on the right. Pedal pulses absent on the right. 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 Notes Wound exam Over the right first metatarsal there is a small linear area. This has roughly 3 mm of depth. The surface of this is somewhat pale probably some debris on the surface I did not debride this. On her right medial calf a small open area with necrotic surface. Our intake nurse noted a stitch that just simply came out when she was cleaning the wound surface. I used a #3 curette to remove the debris on the surface it really cleans up quite nicely Electronic Signature(s) Signed: 08/04/2020 5:00:15 PM By: [name] MD Entered By: [name] on 08/04/2020  10:49:32 -------------------------------------------------------------------------------- Physician Orders Details Patient Name: Date of Service: [place]. 08/04/2020 9:00 A M Medical Record Number: [id number] Patient Account Number: [id number] Date of Birth/Sex: Treating RN: 1941-02-05 (79 y.o. [name] Primary Care Provider: [name] Other Clinician: Referring Provider: Treating Provider/[name]: Tressie Stalker in Treatment: 0 Verbal / Phone Orders: No Diagnosis Coding Follow-up Appointments Return appointment in 3 weeks. - After having procedure with Dr. Donzetta Matters. Dressing Change Frequency Change Dressing every other day. Skin Barriers/Peri-Wound Care Skin Prep Wound Cleansing May shower and wash wound with soap and water. - with dressing changes. Primary Wound Dressing Wound #11 Right,Medial Amputation Site - Transmetatarsal Iodoflex Wound #12 Right,Medial Lower Leg Iodoflex Secondary Dressing Wound #11 Right,Medial Amputation Site - Transmetatarsal Foam Border - or gauze and kerlix. Wound #12 Right,Medial Lower Leg Foam Border - or gauze and kerlix. Edema Control Avoid standing for long periods of time Elevate legs to the level of the heart or above for 30 minutes daily and/or when sitting, a frequency of: - throughout the day. Patient Medications llergies: Demerol, scopolamine, Bactrim, Versed A Notifications Medication Indication Start End lidocaine DOSE topical 4 % gel - gel topical applied only for debridements by MD. Electronic Signature(s) Signed: 08/04/2020 5:00:15 PM By: [name] MD Signed: 08/04/2020 5:09:44 PM By: Deon Pilling Entered By: Deon Pilling on 08/04/2020 09:41:54 Prescription 08/04/2020 -------------------------------------------------------------------------------- Adella Nissen MD Patient Name: Provider: Dec 12, 1940 [id number] Date of Birth: NPI#Wanda Plump  [id number] Sex: DEA #: [phone] Phone #: License #: [employer] Patient Address: [address], [place] [address] [place], [place] [address] [phone] Allergies Demerol; scopolamine; Bactrim; Versed Medication Medication: Route: Strength: Form: lidocaine topical 4% gel Class: TOPICAL LOCAL ANESTHETICS Dose: Frequency / Time: Indication: gel topical applied only for debridements by MD. Number of Refills: Number of Units: 0 Generic Substitution: Start Date: End Date: Administered at Facility: Substitution Permitted Yes Time Administered: Time Discontinued: Note to Pharmacy: Hand Signature: Date(s): Electronic Signature(s) Signed: 08/04/2020 5:00:15 PM By: [name] MD Signed: 08/04/2020 5:09:44 PM By: Deon Pilling Entered By: Deon Pilling on 08/04/2020 09:41:54 -------------------------------------------------------------------------------- Problem List Details Patient Name: Date of Service: [name], [name] 08/04/2020 9:00 A M Medical Record Number: [id number] Patient Account Number: [id number] Date of Birth/Sex: Treating RN: 08-24-1941 (79 y.o. 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 08/04/2020 9:00 A M Medical Record Number: [id number] Patient Account Number: [id number] Date of Birth/Sex: Treating RN: 02-25-1941 (79 y.o. [name], [name] Primary Care Provider: [name] Other Clinician: Referring Provider: Treating Provider/[name]: Tressie Stalker in Treatment: 0 Subjective Chief Complaint Information obtained from Patient 07/13/2019; patient is here for review wounds on the toes of her right foot and the left fourth toe 08/04/2020; patient is here for review of wounds at the anterior part of her right first met head/TMA site and another area on the right medial calf at a vein harvest site. History of Present Illness (HPI) ADMISSION 07/13/2019 Patient is a 79 year old type II diabetic. She has known PAD. She has been followed by Dr. Jacqualyn Posey of podiatry for blistering areas on her toes dating back to 04/30/2019 which at this was the left fourth toe. By 8/11 she had wounds on the right and left second toes. She underwent arterial studies by Dr. Alvester Chou on 7/31 that showed ABIs on the right of 0.60 TBI of 0.26 on the left ABI of 0.56 and a TBI of 0.25. By 9/10 she had ischemic-looking wounds per Dr. Earleen Newport on the right first, left first second and third. She has been using Medihoney and then mupirocin more recently simply Betadine. The patient underwent angiography by Dr. Gwenlyn Found on 9/21. 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 Noted that there was 1 vessel runoff in the foot via the peroneal. ooOn the left there was an 80% left SFA, 70% mid left SFA. There was a short segment calcified CTO distal  less than SFA and above-knee popliteal artery reconstituting with two-vessel runoff. 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 It was felt that she had bilateral SFA disease as well as tibial vessel disease. An attempt was made to revascularize the left SFA but they were unable to cross because of the highly calcified nature of the lesion. 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 She has an area on the medial left fourth toe with raised horned callus on top of this. I am not certain what this represents. With regards to pain she has about a 2-1/2 I will claudication tolerance in the grocery store. She has some pain in night which is relieved by putting her feet down over the side of the bed. She is wearing Nitro-Dur patches on the top of her right foot. Past medical history includes type 2 diabetes with secondary PAD, neoplasm of the skin, diabetic retinopathy, carotid artery stenosis, hyperlipidemia hypertension. 11/2; the last time the patient was here I spoke to Dr. Gwenlyn Found about revascularization options on the right. As I  understand things currently Dr. Gwenlyn Found spoke with Dr. Trula Slade and ultimately the patient was taken to surgery on 10/27. She had a right iliofemoral endarterectomy with a bovine patch angioplasty. I think the plan now is for her to have a staged intervention on the right SFA by Dr. Alvester Chou. Per the patient's understanding Dr. Gwenlyn Found and Dr. Jacqualyn Posey are waiting to see when the gangrenous toes on the right foot can be amputated. The patient states her pain is a lot better and she is grateful for that. 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 Small area on the left fourth toe. She is using Betadine to the mummified areas on the right and Medihoney on the left 08/27/2019; the last time I saw this patient I discharged her from  the clinic. She had been revascularized by Dr. Trula Slade and she had a right iliofemoral endarterectomy with a bovine angioplasty. 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 Jacqualyn Posey of podiatry on 08/07/2019. I note that she also had a intervention by Dr. Gwenlyn Found and he performed a directional atherectomy and drug-coated balloon angioplasty of the SFA and popliteal artery on the right. I am not certain of the date of Dr. Kennon Holter procedure as of the time of this dictation. She was referred back to [place] by Dr. Earleen Newport predominantly for follow-up of the left fourth toe. She still has sutures and stitches in the right TMA site. She states her pain is a lot better. She expresses concern about the condition of the amputation site at the TMA. She is on doxycycline I think prescribed by Dr. Earleen Newport. She is complaining of some pain at night 12/10; I spoke to Dr. Jacqualyn Posey last week. He removed the sutures on the right foot on Monday of this week. She has the area on the left fourth toe just proximal to the PIP and then the right TMA site. She is still on doxycycline and has enough through next week. Unfortunately the TMA site does not look good at all. Both on the lateral and medial part of the incisions are areas that probe to bone. There is purulence over the medial part which I have cultured. We will use silver alginate. Left fourth toe looks somewhat better but there was still exposed bone 12/17; patient has an MRI booked for 12/30. Culture I did last week showed Pseudomonas Serratia and Enterococcus. This was purulent drainage coming out of the medial part of her amputation site. I use cefdinir 300 twice daily for 10 days that started on 12/15. Her x-ray on the right showed limited evaluation for osteomyelitis. The findings could have been postoperative. There was subtle erosion in the distal first and distal fifth metatarsal. An MRI was suggested. On the left she had  irregularity of the fourth middle and proximal phalanx consistent with a history of osteomyelitis. I do not know that she has a history of osteomyelitis in this area. She had a newly defined area on the plantar third toe 12/31; patient's MRI is listed below: MRI OF THE RIGHT FOREFOOT WITHOUT AND WITH CONTRAST TECHNIQUE: Multiplanar, multisequence MR imaging of the right forefoot was performed before and after the administration of intravenous contrast. CONTRAST 6 mL GADAVIST IV SOLN : COMPARISON: Plain films right foot 09/04/2019. FINDINGS: Bones/Joint/Cartilage The patient is status post transmetatarsal amputation as seen on the prior exam. Marrow edema and enhancement are seen in all of the distal metatarsals. In the first metatarsal,  signal change extends 1.5 cm proximal to the stump and in the second metatarsal extends approximately 2 cm proximal to the stump. Edema and enhancement are seen in only the distal 0.7 cm of the third metatarsal stump and tips of the fourth and fifth metatarsals. Bone marrow signal is otherwise unremarkable. A small focus of subchondral edema is seen in the lateral talus, likely degenerative. Ligaments Intact. Muscles and Tendons No intramuscular fluid collection. Soft tissues Skin ulceration is seen off the stump of the first metatarsal. A thin fluid collection tracks deep to the wound and over the anterior metatarsals worrisome for small abscess. Intense subcutaneous edema and enhancement are seen diffusely. 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 Cellulitis about the foot. Skin ulceration over the distal first metatarsal is identified with a thin fluid collection tracking anteriorly along the stump worrisome for abscess. Small focus of subchondral edema in the lateral talus is  likely degenerative. Electronically Signed By: Inge Rise M.D. On: 09/23/2019 15:25 Patient arrives in clinic today not complaining any of any pain. 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 She does not describe claudication however her activity is very limited. 10/01/2019; since the patient was last here I have communicated with Dr. Gwenlyn Found after bypass by Dr. Trula Slade and addressing the right superficial femoral artery he states that she is widely patent through peroneal arteries to the ankle with collaterals to the dorsalis pedis. He states he is going to talk to colleagues about the feasibility of tibial pedal access. The patient seems infectious disease later this afternoon Dr. Baxter Flattery. in preparation for this she has been off antibiotics for 1 week and I went ahead and obtain pieces of the remanence of her first metatarsal for pathology and CandS. The patient is a candidate for hyperbaric oxygen. 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 1/15; considerable improvement in the wounds on her feet. We are using silver collagen. She follows with Dr. Baxter Flattery of infectious disease and is on meropenem and daptomycin. She has been taught how to do this herself at home. Follows with Dr. Graylon Good at the end of treatment here. She has 2 wounds on the surgical TMA site 1 lateral and 1 medial the lateral 1 has regressed tremendously. The area medially still has some exposed bone although the base of this looks healthy. 1/22; 2 separate wounds on the surgical TMA site. Both of these look satisfactory. The medial area does not have any exposed bone. This is an improvement On the left side fourth toe dorsally over the proximal phalanx there is a deep punched out area that probes to bone. She has an area on the tip of the left third toe. She also tells [place] that in HBO she traumatized her  left Achilles and this is left her with a superficial wound area 1/28; weekly visit along with HBO. She has 5 wounds. T our punched out areas on the original TMA site on the right. Both of these appear to have contracted. o The area on the right no longer has exposed bone. She has an area on the tip of the left third toe and on the DIP of the left fourth toe. Both of these had surface callus that I removed and unfortunately they have small areas that both go to bone.  She has a traumatic wound on the left Achilles area that happened in HBO and that appears better. She is tolerating her IV antibiotics well at home. She has home health changing the dressings and she is doing it once on the weekends. We have been using silver collagen. She has been extensively revascularized on the right by Dr. Myra Gianotti and subsequently by Dr. Allyson Sabal. 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 Drue Second of infectious disease on 2/17. Dr. Allyson Sabal on 2/23 and according to her her antibiotics stopped on 2/23. She is tolerating hyperbarics well. She has made a tremendous improvement in the right forefoot with only 2 smaller open wounds. Using silver collagen. On the left foot she has the area on the tip of the left third toe and the medial part of the left fourth toe. These had exposed bone last week I did not sense any of that today 2/12; sees Dr. Ilsa Iha on 2/17 and Dr. Allyson Sabal on 2/23. We are going to use Dermagraft on this today however the lateral part of her train TMA incision on the right is healed and the medial part is down so much that we just continue with the silver collagen She has wounds on the tip of her left third and the medial aspect of the left fourth. Both of these still have exposed bone I have not been able to get these to epithelialize. 2/19; she sees Dr. Allyson Sabal on 2/23 and I have  communicated with him about the left vascular supply. Looking at her angiogram from 08/03/2019 it looks as though that they could not cross the left CFA. Noted that she had one-vessel runoff bilaterally. She also apparently saw Dr. Ilsa Iha although I did not look up these results for preparation of this record. 2/26; Dr. Allyson Sabal is going to do an angiogram next week on Wednesday. I think they are going to try to go at this both retrograde and anterograde to see if anything can be done to revascularize the left lower limb. She continues to make nice progress on the remaining wound on the right medial foot on her TMA site and the toe it wounds on the left are responding nicely as well 3/12; the patient underwent an extensive and complicated revascularization on the left leg by Dr. Kirke Corin on 3/3; she had an atherectomy on occluded left popliteal artery; anterior tibial artery followed by a balloon angioplasty. Atherectomy was also performed to the left SFA because there was still significant 50% stenosis in the left popliteal artery they performed an intravascular lithotripsy which improved the residual stenosis to 20%. The same lithotripsy was used to dilate the proximal left SFA. She had a drug-eluting stent placed I believe in the SFA. The patient returned for hyperbarics this week. She has had some eye problems on the right which she tells me are secondary to diabetic retinopathy and she saw her eye doctor. She is really made excellent progress there is no open wounds on the left foot at all. She has one open area medially and her TMA site on the right lateral wound is closed. 3/19; the patient comes in today with the area on the medial transmet on the right improved. Some debris removed from the surface revealed the still open wound. ooUnfortunately she now has the open area on the third toe tip and the medial aspect  of the dorsal fourth toe. These are in the same location as her previous wounds. These  had actually closed up last week. 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 She is undergone complex revascularizations on the right by Dr. Trula Slade and Dr. Gwenlyn Found and I believe Dr. 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 She completed IVantibiotics as directed by infectious disease and I believe a course of oral antibiotics as well.she underwent a complete 40 treatments of hyperbarics. She comes in today with the vast majority of the right TMA site healed there is a small opening on the most medial aspect. Her left third and left fourth toes appear to have epithelialization although this has fluctuated somewhat. 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 She had a gaping wound on the right transmetatarsal amputation L as well as deep wounds on the left third and left fourth toes. She underwent complex revascularizations on her bilateral lower legs which I described on her last visit. She completed IV antibiotics by infectious disease and 40 treatments of hyperbarics. Her left third and fourth toes are healed. The area on the right transmetatarsal amputation site is also closed today. READMISSION 08/04/2020 [name] is now a 79 year old woman that we had for a complex stay in our clinic from October 2000 14 January 2020. 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 Her current problems with wounds began on October 30 when she developed a spontaneous opening over the right transmet medial aspect over the first metatarsal head. She has been using collagen on this  that she had leftover from her last stay in this clinic. She also has had an open area on the medial right calf from a vein harvest site from her CABG she. She said that this is never really closed since the vein harvest was done. With regards to her arterial status this is a major issue. She had an angiogram by Dr. Gwenlyn Found in September 2020. She developed a gangrenous right first and second toes. Ultimately she was referred to Dr. Trula Slade who did a right common femoral endarterectomy with patch angioplasty. This was on 07/21/2019 and she really had a good result. Dr. Gwenlyn Found did a atherectomy followed by a drug coated balloon angioplasty of her right SFA popliteal and tibial peroneal trunk on 08/03/2019 again with excellent excellent results. Follow-up.Dopplers in November revealed revealed a widely patent SFA and tibioperoneal trunk. UNFORTUNATELY her recent angiogram that was done on 08/01/2020 showed an 80% right common femoral artery stenosis just above the previous endarterectomy site occluded right right SFA at the origin reconstituting the adductor canal by the profunda femoris collaterals. The profunda femoris is also diffusely diseased. There is and again another 80% tibioperoneal trunk stenosis and one-vessel runoff via the peroneal artery. She is seen Dr. Donzetta Matters and she is scheduled for a femoral endarterectomy and right femoral-popliteal bypass grafting with endarterectomy of her tibial peroneal trunk. The scheduled surgery is on 11/23 The patient does not complain of pain at rest. She does have 5-minute walking claudication. The wounds on her  right first met head remanent was spontaneous she does not think she hit this. As mentioned in the area on her right medial calf is a vein harvest site Dr. Gwenlyn Found had texted me about getting her in the clinic and we have arranged this as soon as we can. I have told her that we will have to see how successful Dr. Donzetta Matters is before we know what can be done about  the wounds in a precise fashion. Fortunately there does not seem to be any obvious infection involving the right foot although that may need to be looked at if things really deteriorate. She was treated with IV antibiotics and hyperbaric oxygen for her previous osteomyelitis Patient History Information obtained from Patient. Allergies Demerol, scopolamine, Bactrim (Reaction: increased K+), Versed (Reaction: agitation) Family History Cancer - Mother, Diabetes - Maternal Grandparents, Hypertension - Mother, Stroke - Mother,Maternal Grandparents, No family history of Heart Disease, Hereditary Spherocytosis, Kidney Disease, Lung Disease, Seizures, Thyroid Problems, Tuberculosis. Social History Former smoker, Marital Status - Widowed, Alcohol Use - Never, Drug Use - No History, Caffeine Use - Daily. 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, Peripheral Arterial Disease Denies history of Angina, Arrhythmia, Congestive Heart Failure, Hypotension, Peripheral Venous Disease, Phlebitis, Vasculitis Gastrointestinal Denies history of Cirrhosis , Colitis, Crohnoos, Hepatitis A, Hepatitis B, Hepatitis C Endocrine Patient has history of Type II Diabetes Denies history of Type I Diabetes Genitourinary Denies history of End Stage Renal Disease Immunological Denies history of Lupus Erythematosus, Raynaudoos, Scleroderma Integumentary (Skin) Denies history of History of  Burn Musculoskeletal Denies history of Gout, Rheumatoid Arthritis, Osteoarthritis, Osteomyelitis Neurologic Denies history of Dementia, Neuropathy, Quadriplegia, Paraplegia, Seizure Disorder Oncologic Denies history of Received Chemotherapy, Received Radiation Psychiatric Denies history of Anorexia/bulimia, Confinement Anxiety Medical A Surgical History Notes nd Cardiovascular CABG x 5 Review of Systems (ROS) Constitutional Symptoms (General Health) Denies complaints or symptoms of Fatigue, Fever, Chills, Marked Weight Change. Eyes Complains or has symptoms of Glasses / Contacts - glasses. Ear/Nose/Mouth/Throat Denies complaints or symptoms of Chronic sinus problems or rhinitis. Respiratory Denies complaints or symptoms of Chronic or frequent coughs, Shortness of Breath. Gastrointestinal Denies complaints or symptoms of Frequent diarrhea, Nausea, Vomiting. Integumentary (Skin) Complains or has symptoms of Wounds - wounds on right leg and foot. Musculoskeletal Denies complaints or symptoms of Muscle Pain, Muscle Weakness. Neurologic Denies complaints or symptoms of Numbness/parasthesias. Psychiatric Denies complaints or symptoms of Claustrophobia, Suicidal. Objective Constitutional Patient is hypertensive.. Pulse regular and within target range for patient.Marland Kitchen Respirations regular, non-labored and within target range.. Temperature is normal and within the target range for the patient.Marland Kitchen Appears in no distress. Vitals Time Taken: 9:02 AM, Temperature: 98.0 F, Pulse: 102 bpm, Respiratory Rate: 18 breaths/min, Blood Pressure: 162/78 mmHg. Respiratory work of breathing is normal. Cardiovascular Popliteal pulses absent on the right. Pedal pulses absent on the right. 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 General Notes: Wound exam ooOver the right first metatarsal there is a small linear area. This has roughly 3 mm of depth. The surface of this is somewhat pale probably some  debris on the surface I did not debride this. ooOn her right medial calf a small open area  with necrotic surface. Our intake nurse noted a stitch that just simply came out when she was cleaning the wound surface. I used a #3 curette to remove the debris on the surface it really cleans up quite nicely Integumentary (Hair, Skin) Wound #11 status is Open. Original cause of wound was Gradually Appeared. The wound is located on the Right,Medial Amputation Site - Transmetatarsal. The wound measures 0.3cm length x 0.7cm width x 0.3cm depth; 0.165cm^2 area and 0.049cm^3 volume. There is Fat Layer (Subcutaneous Tissue) exposed. There is no tunneling or undermining noted. There is a medium amount of serosanguineous drainage noted. The wound margin is flat and intact. There is medium (34-66%) pink, pale granulation within the wound bed. There is a medium (34-66%) amount of necrotic tissue within the wound bed including Adherent Slough. Wound #12 status is Open. Original cause of wound was Surgical Injury. The wound is located on the Right,Medial Lower Leg. The wound measures 1.5cm length x 1cm width x 0.1cm depth; 1.178cm^2 area and 0.118cm^3 volume. There is no tunneling or undermining noted. There is a medium amount of serosanguineous drainage noted. The wound margin is flat and intact. There is no granulation within the wound bed. There is a large (67-100%) amount of necrotic tissue within the wound bed including Adherent Slough. 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 There was a Chemical/Enzymatic/Mechanical debridement performed by [name], RN. after achieving pain control using Lidocaine 4% Topical Solution. Other agent used was gauze and wound cleanser. A time out was conducted at 09:30, prior to the start of the procedure. There was no bleeding. The procedure was tolerated well. Post Debridement Measurements: 0.3cm length x 0.7cm width x 0.3cm depth; 0.049cm^3 volume. Character of Wound/Ulcer Post Debridement requires further debridement. Severity of Tissue Post Debridement is: Fat layer exposed. 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 There was a Selective/Open Wound Skin/Epidermis Debridement with a total area of 1.5 sq cm performed by Ricard Dillon., MD. With the following instrument(s): Curette to remove Viable tissue/material. Material removed includes Slough, Skin: Dermis, Skin: Epidermis, and Fibrin/Exudate after achieving pain control using Lidocaine 4% Topical Solution. A time out was conducted at 09:30, prior to the start of the procedure. A Minimum amount of bleeding was controlled with Pressure. The procedure was tolerated well with a pain level of 0 throughout and a pain level of 0 following the procedure. Post Debridement Measurements: 1.5cm length x 1cm width x 0.2cm depth; 0.236cm^3 volume. Character of Wound/Ulcer Post Debridement requires further debridement. Severity of Tissue Post Debridement is: Fat layer exposed. Post procedure Diagnosis Wound #12: Same as Pre-Procedure Plan Follow-up Appointments: Return appointment in 3 weeks. - After having procedure with Dr. Donzetta Matters. Dressing Change Frequency: Change Dressing every other day. Skin Barriers/Peri-Wound  Care: Skin Prep Wound Cleansing: May shower and wash wound with soap and water. - with dressing changes. Primary Wound Dressing: Wound #11 Right,Medial Amputation Site - Transmetatarsal:  Iodoflex Wound #12 Right,Medial Lower Leg: Iodoflex Secondary Dressing: Wound #11 Right,Medial Amputation Site - Transmetatarsal: Foam Border - or gauze and kerlix. Wound #12 Right,Medial Lower Leg: Foam Border - or gauze and kerlix. Edema Control: Avoid standing for long periods of time Elevate legs to the level of the heart or above for 30 minutes daily and/or when sitting, a frequency of: - throughout the day. The following medication(s) was prescribed: lidocaine topical 4 % gel gel topical applied only for debridements by MD. was prescribed at facility 1. I am going to use Iodoflex and border foam on both of these areas. This should help with the debris on both wound surfaces 2. I think this could continue after Dr. Donzetta Matters does his attempt at revascularizing her. 3. We will see her back the week after Thanksgiving provided she does not need [place] before this. 4. I told her I did not think we needed to be aggressive about tracking down any residual osteomyelitis at this point unless things deteriorate. If they are successful in revascularizing the foot and the wound closes there may not need to be any further imaging I spent 35 minutes in review of this patient's past medical history, arterial history, face-to-face evaluation and preparation of this record Electronic Signature(s) Signed: 08/04/2020 5:00:15 PM By: [name] MD Entered By: [name] on 08/04/2020 10:51:21 -------------------------------------------------------------------------------- HxROS Details Patient Name: Date of Service: [place]. 08/04/2020 9:00 A M Medical Record Number: [id number] Patient Account Number: [id number] Date of Birth/Sex: Treating RN: October 18, 1940 (79 y.o. [name] Primary Care Provider:  [name] Other Clinician: Referring Provider: Treating Provider/[name]: Tressie Stalker in Treatment: 0 Information Obtained From Patient Constitutional Symptoms (General Health) Complaints and Symptoms: Negative for: Fatigue; Fever; Chills; Marked Weight Change Eyes Complaints and Symptoms: Positive for: Glasses / Contacts - glasses Medical History: Negative for: Cataracts; Glaucoma; Optic Neuritis Ear/Nose/Mouth/Throat Complaints and Symptoms: Negative for: Chronic sinus problems or rhinitis Medical History: Negative for: Chronic sinus problems/congestion; Middle ear problems Respiratory Complaints and Symptoms: Negative for: Chronic or frequent coughs; Shortness of Breath Medical History: Negative for: Aspiration; Asthma; Chronic Obstructive Pulmonary Disease (COPD); Pneumothorax; Sleep Apnea; Tuberculosis Gastrointestinal Complaints and Symptoms: Negative for: Frequent diarrhea; Nausea; Vomiting Medical History: Negative for: Cirrhosis ; Colitis; Crohns; Hepatitis A; Hepatitis B; Hepatitis C Integumentary (Skin) Complaints and Symptoms: Positive for: Wounds - wounds on right leg and foot Medical History: Negative for: History of Burn Musculoskeletal Complaints and Symptoms: Negative for: Muscle Pain; Muscle Weakness Medical History: Negative for: Gout; Rheumatoid Arthritis; Osteoarthritis; Osteomyelitis Neurologic Complaints and Symptoms: Negative for: Numbness/parasthesias Medical History: Negative for: Dementia; Neuropathy; Quadriplegia; Paraplegia; Seizure Disorder Psychiatric Complaints and Symptoms: Negative for: Claustrophobia; Suicidal Medical History: Negative for: Anorexia/bulimia; Confinement Anxiety Hematologic/Lymphatic Medical History: Negative for: Anemia; Hemophilia; Human Immunodeficiency Virus; Lymphedema; Sickle Cell Disease Cardiovascular Medical History: Positive for: Coronary Artery Disease; Deep  Vein Thrombosis; Hypertension; 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; Peripheral Arterial Disease Negative for: Angina; Arrhythmia; Congestive Heart Failure; Hypotension; Peripheral Venous Disease; Phlebitis; Vasculitis Past Medical History Notes: CABG x 5 Endocrine Medical History: Positive for: Type II Diabetes Negative for: Type I Diabetes Time with diabetes: 47 Treated with: Oral agents Blood sugar tested every day: Yes Tested : daily Genitourinary Medical History: Negative for: End Stage Renal Disease Immunological Medical History: Negative for: Lupus Erythematosus; Raynauds; Scleroderma Oncologic Medical History: Negative for: Received Chemotherapy; Received Radiation Immunizations Pneumococcal Vaccine: Received Pneumococcal Vaccination: No Implantable Devices None Family and Social History Cancer: Yes -  Mother; Diabetes: Yes - Maternal Grandparents; Heart Disease: No; Hereditary Spherocytosis: No; Hypertension: Yes - Mother; Kidney Disease: No; Lung Disease: No; Seizures: No; Stroke: Yes - Mother,Maternal Grandparents; Thyroid Problems: No; Tuberculosis: No; Former smoker; Marital Status - Widowed; Alcohol Use: Never; Drug Use: No History; Caffeine Use: Daily; Financial Concerns: No; Food, Clothing or Shelter Needs: No; Support System Lacking: No; Transportation Concerns: No Engineer, maintenance) Signed: 08/04/2020 5:00:15 PM By: [name] MD Signed: 08/08/2020 5:51:53 PM By: [name] RN, BSN Entered By: [name] on 08/04/2020 09:15:11 -------------------------------------------------------------------------------- [place] Details Patient Name: Date of Service: [name], [place]. 08/04/2020 Medical Record Number: [id number] Patient Account Number: [id number] Date of Birth/Sex: Treating RN: [birth year] (79 y.o. [name], [name] Primary Care Provider: [name] Other Clinician: Referring Provider: Treating Provider/[name]: Tressie Stalker in Treatment: 0 Diagnosis Coding ICD-10 Codes Code Description E11.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 E11.621 Type 2 diabetes mellitus with foot ulcer L97.518 Non-pressure chronic ulcer of other part of right foot with other specified severity L97.218 Non-pressure chronic ulcer of right calf with other specified severity Facility Procedures CPT4 Code: 10301314 Description: 99213 - WOUND CARE VISIT-LEV 3 EST PT Modifier: Quantity: 1 CPT4 Code: 38887579 Description: 72820 - DEBRIDE WOUND 1ST 20 SQ CM OR < ICD-10 Diagnosis Description L97.218 Non-pressure chronic ulcer of right calf with other specified severity Modifier: Quantity: 1 Physician Procedures : CPT4 Code Description Modifier [id number] 37943 - WC PHYS LEVEL 4 - NEW PT 25 ICD-10 Diagnosis Description L97.518 Non-pressure chronic ulcer of other part of right foot with other specified severity L97.218 Non-pressure chronic ulcer of right calf with  other specified severity E11.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 E11.621 Type 2 diabetes mellitus with foot ulcer Quantity: 1 : [id number] 92957 - WC PHYS DEBR WO ANESTH 20 SQ CM ICD-10 Diagnosis Description L97.218 Non-pressure chronic ulcer of right calf with other specified severity Quantity: 1 Electronic Signature(s) Signed: 08/04/2020 5:00:15 PM By: [name] MD Entered By: [name] on 08/04/2020 10:54:19

## 2020-08-09 NOTE — Progress Notes (Signed)
[name], [name] ([id number]) Visit Report for 08/04/2020 Abuse/Suicide Risk Screen Details Patient Name: Date of Service: [name] 08/04/2020 9:00 A M Medical Record Number: [id number] Patient Account Number: [id number] Date of Birth/Sex: Treating RN: [birth year] (79 y.o. [name] Primary Care Hardeep Reetz: [name] Other Clinician: Referring Quintavis [employer]ands: Treating Lindbergh Winkles/[name]: Tressie Stalker in Treatment: 0 Abuse/Suicide Risk Screen Items Answer ABUSE RISK SCREEN: Has anyone close to you tried to hurt or harm you recentlyo No Do you feel uncomfortable with anyone in your familyo No Has anyone forced you do things that you didnt want to doo No Electronic Signature(s) Signed: 08/08/2020 5:51:53 PM By: [name] RN, BSN Entered By: [name] on 08/04/2020 09:15:22 -------------------------------------------------------------------------------- Activities of Daily Living Details Patient Name: Date of Service: [place]. 08/04/2020 9:00 A M Medical Record Number: [id number] Patient Account Number: [id number] Date of Birth/Sex: Treating RN: [birth year] (79 y.o. [name] Primary Care [name]: [name] Other Clinician: Referring Waldemar Siegel: Treating Anntionette Madkins/[name]: Tressie Stalker in Treatment: 0 Activities of Daily Living Items Answer Activities of Daily Living (Please select one for each item) Drive Automobile Completely Able T Medications ake Completely Able Use T elephone Completely Able Care for Appearance Completely Able Use T oilet Completely Able Bath / Shower Completely Able Dress Self Completely Able Feed Self Completely Able Walk Completely Able Get In / Out Bed Completely Able Housework Completely Able Prepare Meals Completely Able Handle Money Completely Able Shop for Self Completely Able Electronic Signature(s) Signed: 08/08/2020 5:51:53 PM By:  [name] RN, BSN Entered By: [name] on 08/04/2020 09:15:56 -------------------------------------------------------------------------------- Education Screening Details Patient Name: Date of Service: [name], [place]. 08/04/2020 9:00 A M Medical Record Number: [id number] Patient Account Number: [id number] Date of Birth/Sex: Treating RN: 04-24-1941 (79 y.o. [name] Primary Care Rudolpho Claxton: [name] Other Clinician: Referring Alyanna Stoermer: Treating Aedin Jeansonne/[name]: Tressie Stalker in Treatment: 0 Primary Learner Assessed: Patient Learning Preferences/Education Level/Primary Language Learning Preference: Explanation, Demonstration, Printed Material Highest Education Level: College or Above Preferred Language: English Cognitive Barrier Language Barrier: No Translator Needed: No Memory Deficit: No Emotional Barrier: No Cultural/Religious Beliefs Affecting Medical Care: No Physical Barrier Impaired Vision: No Impaired Hearing: No Decreased Hand dexterity: No Knowledge/Comprehension Knowledge Level: High Comprehension Level: High Ability to understand written instructions: High Ability to understand verbal instructions: High Motivation Anxiety Level: Calm Cooperation: Cooperative Education Importance: Acknowledges Need Interest in Health Problems: Asks Questions Perception: Coherent Willingness to Engage in Self-Management High Activities: Readiness to Engage in Self-Management High Activities: Electronic Signature(s) Signed: 08/08/2020 5:51:53 PM By: [name] RN, BSN Entered By: [name] on 08/04/2020 09:16:26 -------------------------------------------------------------------------------- Fall Risk Assessment Details Patient Name: Date of Service: [name], [place]. 08/04/2020 9:00 A M Medical Record Number: [id number] Patient Account Number: [id number] Date of Birth/Sex: Treating RN: May 17, 1941 (79  y.o. [name] Primary Care Tavie Haseman: [name] Other Clinician: Referring Samrat Hayward: Treating Theresia Pree/[name]: Tressie Stalker in Treatment: 0 Fall Risk Assessment Items Have you had 2 or more falls in the last 12 monthso 0 No Have you had any fall that resulted in injury in the last 12 monthso 0 No FALLS RISK SCREEN History of falling - immediate or within 3 months 0 No Secondary diagnosis (Do you have 2 or more medical diagnoseso) 15 Yes Ambulatory aid None/bed rest/wheelchair/nurse 0 Yes Crutches/cane/walker 0 No Furniture 0 No Intravenous therapy Access/Saline/Heparin Lock 0 No Gait/Transferring Normal/  bed rest/ wheelchair 0 Yes Weak (short steps with or without shuffle, stooped but able to lift head while walking, may seek 0 No support from furniture) Impaired (short steps with shuffle, may have difficulty arising from chair, head down, impaired 0 No balance) Mental Status Oriented to own ability 0 Yes Electronic Signature(s) Signed: 08/08/2020 5:51:53 PM By: [name] RN, BSN Entered By: [name] on 08/04/2020 09:16:41 -------------------------------------------------------------------------------- Foot Assessment Details Patient Name: Date of Service: [name]. 08/04/2020 9:00 A M Medical Record Number: [id number] Patient Account Number: [id number] Date of Birth/Sex: Treating RN: 28-Dec-1940 (79 y.o. [name] Primary Care Santanna Olenik: [name] Other Clinician: Referring [name]: Treating Daveigh Batty/[name]: Tressie Stalker in Treatment: 0 Foot Assessment Items Site Locations + = Sensation present, - = Sensation absent, C = Callus, U = Ulcer R = Redness, W = Warmth, M = Maceration, PU = Pre-ulcerative lesion F = Fissure, S = Swelling, D = Dryness Assessment Right: Left: Other Deformity: No No Prior Foot Ulcer: No No Prior Amputation: No No Charcot  Joint: No No Ambulatory Status: Ambulatory Without Help Gait: Steady Electronic Signature(s) Signed: 08/08/2020 5:51:53 PM By: [name] RN, BSN Entered By: [name] on 08/04/2020 09:20:19 -------------------------------------------------------------------------------- Nutrition Risk Screening Details Patient Name: Date of Service: [name]. 08/04/2020 9:00 A M Medical Record Number: [id number] Patient Account Number: [id number] Date of Birth/Sex: Treating RN: [birth year] (79 y.o. [name] Primary Care Anavictoria Wilk: [name] Other Clinician: Referring Wynette [place]: Treating Myrle Wanek/[name]: Tressie Stalker in Treatment: 0 Height (in): Weight (lbs): Body Mass Index (BMI): Nutrition Risk Screening Items Score Screening NUTRITION RISK SCREEN: I have an illness or condition that made me change the kind and/or amount of food I eat 2 Yes I eat fewer than two meals per day 0 No I eat few fruits and vegetables, or milk products 0 No I have three or more drinks of beer, liquor or wine almost every day 0 No I have tooth or mouth problems that make it hard for me to eat 0 No I don't always have enough money to buy the food I need 0 No I eat alone most of the time 0 No I take three or more different prescribed or over-the-counter drugs a day 1 Yes Without wanting to, I have lost or gained 10 pounds in the last six months 0 No I am not always physically able to shop, cook and/or feed myself 0 No Nutrition Protocols Good Risk Protocol Moderate Risk Protocol 0 Provide education on nutrition High Risk Proctocol Risk Level: Moderate Risk Score: 3 Electronic Signature(s) Signed: 08/08/2020 5:51:53 PM By: [name] RN, BSN Entered By: [name] on 08/04/2020 09:20:03

## 2020-08-10 ENCOUNTER — Encounter (HOSPITAL_COMMUNITY): Payer: Self-pay

## 2020-08-10 ENCOUNTER — Other Ambulatory Visit: Payer: Self-pay

## 2020-08-10 ENCOUNTER — Encounter (HOSPITAL_COMMUNITY)
Admission: RE | Admit: 2020-08-10 | Discharge: 2020-08-10 | Disposition: A | Discharge status: Home / Self Care | Payer: Medicare Other | Source: Ambulatory Visit | Attending: Vascular Surgery | Admitting: Vascular Surgery

## 2020-08-10 DIAGNOSIS — Z01812 Encounter for preprocedural laboratory examination: Secondary | ICD-10-CM | POA: Insufficient documentation

## 2020-08-10 HISTORY — DX: Anemia, unspecified: D64.9

## 2020-08-10 HISTORY — DX: Atherosclerotic heart disease of native coronary artery without angina pectoris: I25.10

## 2020-08-10 HISTORY — DX: Myoneural disorder, unspecified: G70.9

## 2020-08-10 HISTORY — DX: Cardiac arrhythmia, unspecified: I49.9

## 2020-08-10 HISTORY — DX: Acute myocardial infarction, unspecified: I21.9

## 2020-08-10 LAB — COMPREHENSIVE METABOLIC PANEL
ALT: 30 U/L (ref 0–44)
AST: 25 U/L (ref 15–41)
Albumin: 4.1 g/dL (ref 3.5–5.0)
Alkaline Phosphatase: 62 U/L (ref 38–126)
Anion gap: 12 (ref 5–15)
BUN: 26 mg/dL — ABNORMAL HIGH (ref 8–23)
CO2: 23 mmol/L (ref 22–32)
Calcium: 10.4 mg/dL — ABNORMAL HIGH (ref 8.9–10.3)
Chloride: 104 mmol/L (ref 98–111)
Creatinine, Ser: 1.11 mg/dL — ABNORMAL HIGH (ref 0.44–1.00)
GFR, Estimated: 51 mL/min — ABNORMAL LOW (ref >60)
Glucose, Bld: 200 mg/dL — ABNORMAL HIGH (ref 70–99)
Potassium: 4.4 mmol/L (ref 3.5–5.1)
Sodium: 139 mmol/L (ref 135–145)
Total Bilirubin: 0.5 mg/dL (ref 0.3–1.2)
Total Protein: 7.1 g/dL (ref 6.5–8.1)

## 2020-08-10 LAB — URINALYSIS, ROUTINE W REFLEX MICROSCOPIC
Bacteria, UA: NONE SEEN
Bilirubin Urine: NEGATIVE
Glucose, UA: >=500 mg/dL — AB
Hgb urine dipstick: NEGATIVE
Ketones, ur: NEGATIVE mg/dL
Nitrite: NEGATIVE
Protein, ur: NEGATIVE mg/dL
Specific Gravity, Urine: 1.018 (ref 1.005–1.030)
pH: 5 (ref 5.0–8.0)

## 2020-08-10 LAB — CBC
HCT: 40.5 % (ref 36.0–46.0)
Hemoglobin: 12.7 g/dL (ref 12.0–15.0)
MCH: 27.5 pg (ref 26.0–34.0)
MCHC: 31.4 g/dL (ref 30.0–36.0)
MCV: 87.7 fL (ref 80.0–100.0)
Platelets: 315 10*3/uL (ref 150–400)
RBC: 4.62 MIL/uL (ref 3.87–5.11)
RDW: 15.5 % (ref 11.5–15.5)
WBC: 10.4 10*3/uL (ref 4.0–10.5)
nRBC: 0 % (ref 0.0–0.2)

## 2020-08-10 LAB — SURGICAL PCR SCREEN
MRSA, PCR: NEGATIVE
Staphylococcus aureus: NEGATIVE

## 2020-08-10 LAB — HEMOGLOBIN A1C
Hgb A1c MFr Bld: 7.1 % — ABNORMAL HIGH (ref 4.8–5.6)
Mean Plasma Glucose: 157.07 mg/dL

## 2020-08-10 LAB — PROTIME-INR
INR: 1 (ref 0.8–1.2)
Prothrombin Time: 13.2 seconds (ref 11.4–15.2)

## 2020-08-10 LAB — APTT: aPTT: 28 seconds (ref 24–36)

## 2020-08-10 LAB — GLUCOSE, CAPILLARY: Glucose-Capillary: 176 mg/dL — ABNORMAL HIGH (ref 70–99)

## 2020-08-10 NOTE — Progress Notes (Signed)
PCP - Dr. [name] Cardiologist - Dr. [name]  PPM/ICD -denies    Chest x-ray - 04/04/20 EKG - 06/14/20 Stress Test - 2007 ECHO - 03/23/20 Cardiac Cath - 03/13/20  Sleep Study - denies  Fasting Blood Sugar - 100-120s Checks Blood Sugar every morning  Follow your surgeon's instructions on when to stop Aspirin and plavix. [facility] office called and they advised to hold plavix for 5 days before the procedure and continue taking aspirin.  If no instructions were given by your surgeon then you will need to call the office to get those instructions.    ERAS Protcol -no   COVID TEST- 08/13/20 09:20am   Anesthesia review: yes, cardiac history (CABG in June 2021)  Patient denies shortness of breath, fever, cough and chest pain at PAT appointment   All instructions explained to the patient, with a verbal understanding of the material. Patient agrees to go over the instructions while at home for a better understanding. Patient also instructed to self quarantine after being tested for COVID-19. The opportunity to ask questions was provided.

## 2020-08-11 NOTE — Progress Notes (Signed)
Pt made aware that she is to continue Plavix prior to surgery.   Jacqlyn Larsen, RN

## 2020-08-11 NOTE — Progress Notes (Signed)
Anesthesia Chart Review:  Follows with cardiology for history of CAD status post recent non-STEMI June 2021, ultimately she underwent CABG x5 by Dr. Orvan Seen on 03/24/2020.  Per follow-up notes she has done very well with this, is participating in cardiac rehab remotely at home via an app.  She is also followed by Dr. Gwenlyn Found for history of PAD.  She has a history of right common femoral endarterectomy patch angioplasty 07/01/2019 by Dr. Trula Slade.  She has also had orbital atherectomy and drug-coated balloon angioplasty of the right SFA, popliteal and tibioperoneal trunk on 08/03/2019 by Dr. Gwenlyn Found.  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  Dr. Gwenlyn Found performed peripheral angiography on 08/01/2020 which showed high-grade right common femoral artery stenosis, occluded right SFA at the origin reconstituting and adductor canal with one-vessel runoff via the peroneal artery.  It was determined she would need femoral endarterectomy and right femoral-popliteal bypass grafting.  Dr. Donzetta Matters was consulted with plan for expedited surgical revascularization for limb salvage.  Per Dr. Donzetta Matters, patient will stay on Plavix and aspirin perioperatively.  Preop labs reviewed, unremarkable.  DM2 reasonably well controlled with A1c 7.1.  TTE 03/23/20: 1. Left ventricular ejection fraction, by estimation, is 60 to 65%. The  left ventricle has normal function. The left ventricle has no regional  wall motion abnormalities. Left ventricular diastolic parameters are  consistent with Grade I diastolic  dysfunction (impaired relaxation).  2. Right ventricular systolic function is normal. The right ventricular  size is normal. There is normal pulmonary artery systolic pressure.  3. Left atrial size was mildly dilated.  4. The mitral valve is normal in structure. Trivial mitral valve  regurgitation. No evidence of mitral stenosis.  5. The aortic valve is  normal in structure. Aortic valve regurgitation is  not visualized. No aortic stenosis is present.  6. The inferior vena cava is normal in size with greater than 50%  respiratory variability, suggesting right atrial pressure of 3 mmHg.   Spirometry 03/23/2020: FVC-Pre Latest Units: L 2.30  FVC-%Pred-Pre Latest Units: % 83  FEV1-Pre Latest Units: L 1.80  FEV1-%Pred-Pre Latest Units: % 87  Pre FEV1/FVC ratio Latest Units: % 78  FEV1FVC-%Pred-Pre Latest Units: % 105  FEF 25-75 Pre Latest Units: L/sec 1.60  FEF2575-%Pred-Pre Latest Units: % 104  FEV6-Pre Latest Units: L 2.29  FEV6-%Pred-Pre Latest Units: % 87  Pre FEV6/FVC Ratio Latest Units: % 100  FEV6FVC-%Pred-Pre Latest Units: % [place], PA-C [facility]Anesthesiology Phone [phone] 08/11/2020 1:35 PM

## 2020-08-11 NOTE — Anesthesia Preprocedure Evaluation (Deleted)
Anesthesia Evaluation    Airway        Dental   Pulmonary former smoker,           Cardiovascular hypertension,      Neuro/Psych    GI/Hepatic   Endo/Other  diabetes  Renal/GU      Musculoskeletal   Abdominal   Peds  Hematology   Anesthesia Other Findings   Reproductive/Obstetrics                             Anesthesia Physical Anesthesia Plan  ASA:   Anesthesia Plan:    Post-op Pain Management:    Induction:   PONV Risk Score and Plan:   Airway Management Planned:   Additional Equipment:   Intra-op Plan:   Post-operative Plan:   Informed Consent:   Plan Discussed with:   Anesthesia Plan Comments: (PAT note by [name], PA-C: Follows with cardiology for history of CAD status post recent non-STEMI June 2021, ultimately she underwent CABG x5 by Dr. Orvan Seen on 03/24/2020.  Per follow-up notes she has done very well with this, is participating in cardiac rehab remotely at home via an app.  She is also followed by Dr. Gwenlyn Found for history of PAD.  She has a history of right common femoral endarterectomy patch angioplasty 07/01/2019 by Dr. Trula Slade.  She has also had orbital atherectomy and drug-coated balloon angioplasty of the right SFA, popliteal and tibioperoneal trunk on 08/03/2019 by Dr. Gwenlyn Found.  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  Dr. Gwenlyn Found performed peripheral angiography on 08/01/2020 which showed high-grade right common femoral artery stenosis, occluded right SFA at the origin reconstituting and adductor canal with one-vessel runoff via the peroneal artery.  It was determined she would need femoral endarterectomy and right femoral-popliteal bypass grafting.  Dr. Donzetta Matters was consulted with plan for expedited surgical revascularization for limb salvage.  Per Dr. Donzetta Matters, patient will stay on Plavix and  aspirin perioperatively.  Preop labs reviewed, unremarkable.  DM2 reasonably well controlled with A1c 7.1.  TTE 03/23/20: 1. Left ventricular ejection fraction, by estimation, is 60 to 65%. The  left ventricle has normal function. The left ventricle has no regional  wall motion abnormalities. Left ventricular diastolic parameters are  consistent with Grade I diastolic  dysfunction (impaired relaxation).  2. Right ventricular systolic function is normal. The right ventricular  size is normal. There is normal pulmonary artery systolic pressure.  3. Left atrial size was mildly dilated.  4. The mitral valve is normal in structure. Trivial mitral valve  regurgitation. No evidence of mitral stenosis.  5. The aortic valve is normal in structure. Aortic valve regurgitation is  not visualized. No aortic stenosis is present.  6. The inferior vena cava is normal in size with greater than 50%  respiratory variability, suggesting right atrial pressure of 3 mmHg.   Spirometry 03/23/2020: FVC-Pre Latest Units: L 2.30 FVC-%Pred-Pre Latest Units: % 83 FEV1-Pre Latest Units: L 1.80 FEV1-%Pred-Pre Latest Units: % 87 Pre FEV1/FVC ratio Latest Units: % 78 FEV1FVC-%Pred-Pre Latest Units: % 105 FEF 25-75 Pre Latest Units: L/sec 1.60 FEF2575-%Pred-Pre Latest Units: % 104 FEV6-Pre Latest Units: L 2.29 FEV6-%Pred-Pre Latest Units: % 87 Pre FEV6/FVC Ratio Latest Units: % 100 FEV6FVC-%Pred-Pre Latest Units: % 105  )        Anesthesia Quick Evaluation

## 2020-08-12 ENCOUNTER — Inpatient Hospital Stay (HOSPITAL_COMMUNITY): Admission: RE | Admit: 2020-08-12 | Payer: Medicare Other | Source: Ambulatory Visit

## 2020-08-13 ENCOUNTER — Other Ambulatory Visit (HOSPITAL_COMMUNITY)
Admission: RE | Admit: 2020-08-13 | Discharge: 2020-08-13 | Disposition: A | Discharge status: Home / Self Care | Payer: Medicare Other | Source: Ambulatory Visit | Attending: Vascular Surgery | Admitting: Vascular Surgery

## 2020-08-13 DIAGNOSIS — Z01812 Encounter for preprocedural laboratory examination: Secondary | ICD-10-CM | POA: Insufficient documentation

## 2020-08-13 DIAGNOSIS — Z20822 Contact with and (suspected) exposure to covid-19: Secondary | ICD-10-CM | POA: Insufficient documentation

## 2020-08-13 LAB — SARS CORONAVIRUS 2 (TAT 6-24 HRS): SARS Coronavirus 2: NEGATIVE

## 2020-08-15 NOTE — Anesthesia Preprocedure Evaluation (Addendum)
Anesthesia Evaluation  Patient identified by MRN, date of birth, ID band Patient awake    Reviewed: Allergy & Precautions, NPO status , Patient's Chart, lab work & pertinent test results  Airway Mallampati: II  TM Distance: >3 FB Neck ROM: Full    Dental no notable dental hx. (+) Caps, Dental Advisory Given   Pulmonary pneumonia, resolved, former smoker,  Quit smoking 1973   Pulmonary exam normal        Cardiovascular hypertension, Pt. on medications and Pt. on home beta blockers + CAD, + Past MI, + CABG and + Peripheral Vascular Disease  Normal cardiovascular exam+ Valvular Problems/Murmurs   Critical ischemia right leg Bilateral carotid stenosis <50%  Echo 2013: EF>55% Borderline concentric left ventricular hypertrophy. There is a small calcified mass in the L:A near the LA appendage. No valvular masses seen with associated mitral annular calcification. LA Volume/ BSA27.4 ml/m2 No AS. Right ventricular systolic pressure is elevated at 69mmHg.   Neuro/Psych negative neurological ROS  negative psych ROS   GI/Hepatic Neg liver ROS, hiatal hernia, GERD  Controlled and Medicated,  Endo/Other  diabetes, Well Controlled, Type 2, Oral Hypoglycemic AgentsHyperlipidemia  Renal/GU negative Renal ROS  negative genitourinary   Musculoskeletal  (+) Arthritis , Osteoarthritis,    Abdominal   Peds  Hematology negative hematology ROS (+)   Anesthesia Other Findings Bad reaction to versed  Reproductive/Obstetrics negative OB ROS                            Anesthesia Physical  Anesthesia Plan  ASA: III  Anesthesia Plan: General   Post-op Pain Management:    Induction: Intravenous  PONV Risk Score and Plan: 4 or greater and Treatment may vary due to age or medical condition, Ondansetron, Dexamethasone and Diphenhydramine  Airway Management Planned: Oral ETT  Additional Equipment: None  Intra-op  Plan:   Post-operative Plan: Extubation in OR  Informed Consent: I have reviewed the patients History and Physical, chart, labs and discussed the procedure including the risks, benefits and alternatives for the proposed anesthesia with the patient or authorized representative who has indicated his/her understanding and acceptance.     Dental advisory given  Plan Discussed with: Anesthesiologist and CRNA  Anesthesia Plan Comments: (PAT note by [name], PA-C: Follows with cardiology for history of CAD status post recent non-STEMI June 2021, ultimately she underwent CABG x5 by Dr. Orvan Seen on 03/24/2020.  Per follow-up notes she has done very well with this, is participating in cardiac rehab remotely at home via an app.  She is also followed by Dr. Gwenlyn Found for history of PAD.  She has a history of right common femoral endarterectomy patch angioplasty 07/01/2019 by Dr. Trula Slade.  She has also had orbital atherectomy and drug-coated balloon angioplasty of the right SFA, popliteal and tibioperoneal trunk on 08/03/2019 by Dr. Gwenlyn Found.  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  Dr. Gwenlyn Found performed peripheral angiography on 08/01/2020 which showed high-grade right common femoral artery stenosis, occluded right SFA at the origin reconstituting and adductor canal with one-vessel runoff via the peroneal artery.  It was determined she would need femoral endarterectomy and right femoral-popliteal bypass grafting.  Dr. Donzetta Matters was consulted with plan for expedited surgical revascularization for limb salvage.  Per Dr. Donzetta Matters, patient will stay on Plavix and aspirin perioperatively.  Preop labs reviewed, unremarkable.  DM2 reasonably well controlled  with A1c 7.1.  TTE 03/23/20: 1. Left ventricular ejection fraction, by estimation, is 60 to 65%. The  left ventricle has normal function. The left ventricle has no regional  wall motion  abnormalities. Left ventricular diastolic parameters are  consistent with Grade I diastolic  dysfunction (impaired relaxation).  2. Right ventricular systolic function is normal. The right ventricular  size is normal. There is normal pulmonary artery systolic pressure.  3. Left atrial size was mildly dilated.  4. The mitral valve is normal in structure. Trivial mitral valve  regurgitation. No evidence of mitral stenosis.  5. The aortic valve is normal in structure. Aortic valve regurgitation is  not visualized. No aortic stenosis is present.  6. The inferior vena cava is normal in size with greater than 50%  respiratory variability, suggesting right atrial pressure of 3 mmHg.   Spirometry 03/23/2020: FVC-Pre Latest Units: L 2.30 FVC-%Pred-Pre Latest Units: % 83 FEV1-Pre Latest Units: L 1.80 FEV1-%Pred-Pre Latest Units: % 87 Pre FEV1/FVC ratio Latest Units: % 78 FEV1FVC-%Pred-Pre Latest Units: % 105 FEF 25-75 Pre Latest Units: L/sec 1.60 FEF2575-%Pred-Pre Latest Units: % 104 FEV6-Pre Latest Units: L 2.29 FEV6-%Pred-Pre Latest Units: % 87 Pre FEV6/FVC Ratio Latest Units: % 100 FEV6FVC-%Pred-Pre Latest Units: % 105  )       Anesthesia Quick Evaluation

## 2020-08-16 ENCOUNTER — Encounter (HOSPITAL_COMMUNITY)
Admission: RE | Disposition: A | Discharge status: Home / Self Care | Payer: Self-pay | Source: Home / Self Care | Attending: Vascular Surgery

## 2020-08-16 ENCOUNTER — Inpatient Hospital Stay (HOSPITAL_COMMUNITY)
Admission: RE | Admit: 2020-08-16 | Discharge: 2020-08-18 | DRG: 254 | Disposition: A | Discharge status: Home / Self Care | Payer: Medicare Other | Attending: Vascular Surgery | Admitting: Vascular Surgery

## 2020-08-16 ENCOUNTER — Inpatient Hospital Stay (HOSPITAL_COMMUNITY): Payer: Medicare Other | Admitting: Vascular Surgery

## 2020-08-16 ENCOUNTER — Inpatient Hospital Stay (HOSPITAL_COMMUNITY): Payer: Medicare Other | Admitting: Physician Assistant

## 2020-08-16 DIAGNOSIS — I6523 Occlusion and stenosis of bilateral carotid arteries: Secondary | ICD-10-CM | POA: Diagnosis present

## 2020-08-16 DIAGNOSIS — E1152 Type 2 diabetes mellitus with diabetic peripheral angiopathy with gangrene: Secondary | ICD-10-CM | POA: Diagnosis not present

## 2020-08-16 DIAGNOSIS — I10 Essential (primary) hypertension: Secondary | ICD-10-CM | POA: Diagnosis not present

## 2020-08-16 DIAGNOSIS — E11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: Principal | ICD-10-CM | POA: Diagnosis present

## 2020-08-16 DIAGNOSIS — E785 Hyperlipidemia, unspecified: Secondary | ICD-10-CM | POA: Diagnosis not present

## 2020-08-16 DIAGNOSIS — Z95820 Peripheral vascular angioplasty status with implants and grafts: Secondary | ICD-10-CM

## 2020-08-16 DIAGNOSIS — Z888 Allergy status to other drugs, medicaments and biological substances status: Secondary | ICD-10-CM

## 2020-08-16 DIAGNOSIS — Z951 Presence of aortocoronary bypass graft: Secondary | ICD-10-CM | POA: Diagnosis not present

## 2020-08-16 DIAGNOSIS — Z87891 Personal history of nicotine dependence: Secondary | ICD-10-CM

## 2020-08-16 DIAGNOSIS — Z79899 Other long term (current) drug therapy: Secondary | ICD-10-CM

## 2020-08-16 DIAGNOSIS — Z89431 Acquired absence of right foot: Secondary | ICD-10-CM | POA: Diagnosis not present

## 2020-08-16 DIAGNOSIS — Z20822 Contact with and (suspected) exposure to covid-19: Secondary | ICD-10-CM | POA: Diagnosis not present

## 2020-08-16 DIAGNOSIS — I214 Non-ST elevation (NSTEMI) myocardial infarction: Secondary | ICD-10-CM | POA: Diagnosis not present

## 2020-08-16 DIAGNOSIS — Z83438 Family history of other disorder of lipoprotein metabolism and other lipidemia: Secondary | ICD-10-CM

## 2020-08-16 DIAGNOSIS — Z8249 Family history of ischemic heart disease and other diseases of the circulatory system: Secondary | ICD-10-CM

## 2020-08-16 DIAGNOSIS — Z7984 Long term (current) use of oral hypoglycemic drugs: Secondary | ICD-10-CM

## 2020-08-16 DIAGNOSIS — I739 Peripheral vascular disease, unspecified: Secondary | ICD-10-CM | POA: Diagnosis present

## 2020-08-16 DIAGNOSIS — Z882 Allergy status to sulfonamides status: Secondary | ICD-10-CM | POA: Diagnosis not present

## 2020-08-16 DIAGNOSIS — I70235 Atherosclerosis of native arteries of right leg with ulceration of other part of foot: Secondary | ICD-10-CM | POA: Diagnosis not present

## 2020-08-16 DIAGNOSIS — K219 Gastro-esophageal reflux disease without esophagitis: Secondary | ICD-10-CM | POA: Diagnosis present

## 2020-08-16 DIAGNOSIS — I2510 Atherosclerotic heart disease of native coronary artery without angina pectoris: Secondary | ICD-10-CM | POA: Diagnosis not present

## 2020-08-16 DIAGNOSIS — I998 Other disorder of circulatory system: Secondary | ICD-10-CM | POA: Diagnosis not present

## 2020-08-16 DIAGNOSIS — Z7902 Long term (current) use of antithrombotics/antiplatelets: Secondary | ICD-10-CM

## 2020-08-16 DIAGNOSIS — L97519 Non-pressure chronic ulcer of other part of right foot with unspecified severity: Secondary | ICD-10-CM | POA: Diagnosis present

## 2020-08-16 DIAGNOSIS — Z7982 Long term (current) use of aspirin: Secondary | ICD-10-CM

## 2020-08-16 DIAGNOSIS — Z808 Family history of malignant neoplasm of other organs or systems: Secondary | ICD-10-CM | POA: Diagnosis not present

## 2020-08-16 DIAGNOSIS — Z85828 Personal history of other malignant neoplasm of skin: Secondary | ICD-10-CM | POA: Diagnosis not present

## 2020-08-16 DIAGNOSIS — Z823 Family history of stroke: Secondary | ICD-10-CM

## 2020-08-16 DIAGNOSIS — I252 Old myocardial infarction: Secondary | ICD-10-CM

## 2020-08-16 DIAGNOSIS — I70201 Unspecified atherosclerosis of native arteries of extremities, right leg: Secondary | ICD-10-CM | POA: Diagnosis not present

## 2020-08-16 HISTORY — PX: FEMORAL-POPLITEAL BYPASS GRAFT: SHX937

## 2020-08-16 HISTORY — PX: ENDARTERECTOMY FEMORAL: SHX5804

## 2020-08-16 LAB — GLUCOSE, CAPILLARY
Glucose-Capillary: 88 mg/dL (ref 70–99)
Glucose-Capillary: 143 mg/dL — ABNORMAL HIGH (ref 70–99)
Glucose-Capillary: 160 mg/dL — ABNORMAL HIGH (ref 70–99)

## 2020-08-16 LAB — CBC
HCT: 29.9 % — ABNORMAL LOW (ref 36.0–46.0)
Hemoglobin: 9.2 g/dL — ABNORMAL LOW (ref 12.0–15.0)
MCH: 26.9 pg (ref 26.0–34.0)
MCHC: 30.8 g/dL (ref 30.0–36.0)
MCV: 87.4 fL (ref 80.0–100.0)
Platelets: 240 10*3/uL (ref 150–400)
RBC: 3.42 MIL/uL — ABNORMAL LOW (ref 3.87–5.11)
RDW: 15.5 % (ref 11.5–15.5)
WBC: 21.2 10*3/uL — ABNORMAL HIGH (ref 4.0–10.5)
nRBC: 0 % (ref 0.0–0.2)

## 2020-08-16 LAB — CREATININE, SERUM
Creatinine, Ser: 0.93 mg/dL (ref 0.44–1.00)
GFR, Estimated: >60 mL/min (ref >60)

## 2020-08-16 SURGERY — ENDARTERECTOMY, FEMORAL
Anesthesia: General | Laterality: Right

## 2020-08-16 MED ORDER — 0.9 % SODIUM CHLORIDE (POUR BTL) OPTIME
TOPICAL | Status: DC | PRN
  Administered 2020-08-16 (×2): 1000 mL

## 2020-08-16 MED ORDER — HEPARIN SODIUM (PORCINE) 1000 UNIT/ML IJ SOLN
INTRAMUSCULAR | Status: DC | PRN
  Administered 2020-08-16: 6000 [IU] via INTRAVENOUS
  Administered 2020-08-16: 3000 [IU] via INTRAVENOUS

## 2020-08-16 MED ORDER — PROTAMINE SULFATE 10 MG/ML IV SOLN
INTRAVENOUS | Status: DC | PRN
  Administered 2020-08-16: 10 mg via INTRAVENOUS
  Administered 2020-08-16 (×2): 15 mg via INTRAVENOUS
  Administered 2020-08-16: 10 mg via INTRAVENOUS

## 2020-08-16 MED ORDER — SUGAMMADEX SODIUM 200 MG/2ML IV SOLN
INTRAVENOUS | Status: DC | PRN
  Administered 2020-08-16: 200 mg via INTRAVENOUS

## 2020-08-16 MED ORDER — FENTANYL CITRATE (PF) 100 MCG/2ML IJ SOLN
25.0000 ug | INTRAMUSCULAR | Status: DC | PRN
  Administered 2020-08-16 (×2): 25 ug via INTRAVENOUS

## 2020-08-16 MED ORDER — POTASSIUM CHLORIDE CRYS ER 20 MEQ PO TBCR: 20.0000 meq | EXTENDED_RELEASE_TABLET | Freq: Every day | ORAL | Status: DC | PRN

## 2020-08-16 MED ORDER — POLYETHYL GLYCOL-PROPYL GLYCOL 0.4-0.3 % OP GEL: Freq: Two times a day (BID) | OPHTHALMIC | Status: DC | PRN

## 2020-08-16 MED ORDER — ALBUMIN HUMAN 5 % IV SOLN: INTRAVENOUS | Status: DC | PRN

## 2020-08-16 MED ORDER — BISACODYL 5 MG PO TBEC: 5.0000 mg | DELAYED_RELEASE_TABLET | Freq: Every day | ORAL | Status: DC | PRN

## 2020-08-16 MED ORDER — ONDANSETRON HCL 4 MG/2ML IJ SOLN
INTRAMUSCULAR | Status: DC | PRN
  Administered 2020-08-16: 4 mg via INTRAVENOUS

## 2020-08-16 MED ORDER — FENTANYL CITRATE (PF) 100 MCG/2ML IJ SOLN
INTRAMUSCULAR | Status: AC
  Filled 2020-08-16: qty 2

## 2020-08-16 MED ORDER — SODIUM CHLORIDE 0.9 % IV SOLN
500.0000 mL | Freq: Once | INTRAVENOUS | Status: AC | PRN
  Administered 2020-08-16 (×2): 500 mL via INTRAVENOUS

## 2020-08-16 MED ORDER — CHLORHEXIDINE GLUCONATE 0.12 % MT SOLN
15.0000 mL | Freq: Once | OROMUCOSAL | Status: AC
  Administered 2020-08-16: 15 mL via OROMUCOSAL
  Filled 2020-08-16: qty 15

## 2020-08-16 MED ORDER — HYDROCHLOROTHIAZIDE 25 MG PO TABS
12.5000 mg | ORAL_TABLET | Freq: Every day | ORAL | Status: DC
  Administered 2020-08-17 – 2020-08-18 (×2): 12.5 mg via ORAL
  Filled 2020-08-16 (×2): qty 1

## 2020-08-16 MED ORDER — ATORVASTATIN CALCIUM 80 MG PO TABS
80.0000 mg | ORAL_TABLET | Freq: Every day | ORAL | Status: DC
  Administered 2020-08-16 – 2020-08-17 (×2): 80 mg via ORAL
  Filled 2020-08-16 (×2): qty 1

## 2020-08-16 MED ORDER — DEXAMETHASONE SODIUM PHOSPHATE 10 MG/ML IJ SOLN
INTRAMUSCULAR | Status: AC
  Filled 2020-08-16: qty 1

## 2020-08-16 MED ORDER — SODIUM CHLORIDE 0.9 % IV SOLN: INTRAVENOUS | Status: DC

## 2020-08-16 MED ORDER — LABETALOL HCL 5 MG/ML IV SOLN
10.0000 mg | INTRAVENOUS | Status: DC | PRN
  Filled 2020-08-16: qty 4

## 2020-08-16 MED ORDER — PROPOFOL 10 MG/ML IV BOLUS
INTRAVENOUS | Status: DC | PRN
  Administered 2020-08-16: 140 mg via INTRAVENOUS

## 2020-08-16 MED ORDER — DOCUSATE SODIUM 100 MG PO CAPS
100.0000 mg | ORAL_CAPSULE | Freq: Every day | ORAL | Status: DC
  Administered 2020-08-17 – 2020-08-18 (×2): 100 mg via ORAL
  Filled 2020-08-16 (×2): qty 1

## 2020-08-16 MED ORDER — INSULIN ASPART 100 UNIT/ML ~~LOC~~ SOLN
0.0000 [IU] | Freq: Three times a day (TID) | SUBCUTANEOUS | Status: DC
  Administered 2020-08-16 – 2020-08-17 (×3): 2 [IU] via SUBCUTANEOUS

## 2020-08-16 MED ORDER — PANTOPRAZOLE SODIUM 40 MG PO TBEC
40.0000 mg | DELAYED_RELEASE_TABLET | Freq: Every day | ORAL | Status: DC
  Administered 2020-08-16 – 2020-08-18 (×3): 40 mg via ORAL
  Filled 2020-08-16 (×3): qty 1

## 2020-08-16 MED ORDER — ONDANSETRON HCL 4 MG/2ML IJ SOLN
INTRAMUSCULAR | Status: AC
  Filled 2020-08-16: qty 2

## 2020-08-16 MED ORDER — ROCURONIUM BROMIDE 10 MG/ML (PF) SYRINGE
PREFILLED_SYRINGE | INTRAVENOUS | Status: DC | PRN
  Administered 2020-08-16: 80 mg via INTRAVENOUS

## 2020-08-16 MED ORDER — CEFAZOLIN SODIUM 1 G IJ SOLR
INTRAMUSCULAR | Status: AC
  Filled 2020-08-16: qty 20

## 2020-08-16 MED ORDER — LACTATED RINGERS IV SOLN: INTRAVENOUS | Status: DC

## 2020-08-16 MED ORDER — ACETAMINOPHEN 325 MG PO TABS
325.0000 mg | ORAL_TABLET | ORAL | Status: DC | PRN
  Administered 2020-08-17 – 2020-08-18 (×3): 650 mg via ORAL
  Filled 2020-08-16 (×3): qty 2

## 2020-08-16 MED ORDER — SODIUM CHLORIDE 0.9 % IV SOLN
INTRAVENOUS | Status: AC
  Filled 2020-08-16: qty 1.2

## 2020-08-16 MED ORDER — HEPARIN SODIUM (PORCINE) 5000 UNIT/ML IJ SOLN
5000.0000 [IU] | Freq: Three times a day (TID) | INTRAMUSCULAR | Status: DC
  Administered 2020-08-16 – 2020-08-18 (×5): 5000 [IU] via SUBCUTANEOUS
  Filled 2020-08-16 (×5): qty 1

## 2020-08-16 MED ORDER — DEXAMETHASONE SODIUM PHOSPHATE 10 MG/ML IJ SOLN
INTRAMUSCULAR | Status: DC | PRN
  Administered 2020-08-16: 8 mg via INTRAVENOUS

## 2020-08-16 MED ORDER — SODIUM CHLORIDE 0.9 % IV SOLN: INTRAVENOUS | Status: DC | PRN

## 2020-08-16 MED ORDER — HEPARIN SODIUM (PORCINE) 1000 UNIT/ML IJ SOLN
INTRAMUSCULAR | Status: AC
  Filled 2020-08-16: qty 1

## 2020-08-16 MED ORDER — LIDOCAINE HCL (PF) 2 % IJ SOLN
INTRAMUSCULAR | Status: AC
  Filled 2020-08-16: qty 5

## 2020-08-16 MED ORDER — OXYCODONE-ACETAMINOPHEN 5-325 MG PO TABS
1.0000 | ORAL_TABLET | ORAL | Status: DC | PRN
  Administered 2020-08-16 – 2020-08-17 (×3): 2 via ORAL
  Administered 2020-08-17: 1 via ORAL
  Filled 2020-08-16 (×3): qty 2

## 2020-08-16 MED ORDER — METFORMIN HCL 500 MG PO TABS
1000.0000 mg | ORAL_TABLET | Freq: Two times a day (BID) | ORAL | Status: DC
  Administered 2020-08-17 – 2020-08-18 (×3): 1000 mg via ORAL
  Filled 2020-08-16 (×3): qty 2

## 2020-08-16 MED ORDER — ORAL CARE MOUTH RINSE: 15.0000 mL | Freq: Once | OROMUCOSAL | Status: AC

## 2020-08-16 MED ORDER — ASPIRIN EC 81 MG PO TBEC
81.0000 mg | DELAYED_RELEASE_TABLET | Freq: Every day | ORAL | Status: DC
  Administered 2020-08-17: 81 mg via ORAL
  Filled 2020-08-16: qty 1

## 2020-08-16 MED ORDER — METOPROLOL TARTRATE 25 MG PO TABS
37.5000 mg | ORAL_TABLET | Freq: Two times a day (BID) | ORAL | Status: DC
  Administered 2020-08-16 – 2020-08-18 (×4): 37.5 mg via ORAL
  Filled 2020-08-16 (×4): qty 1

## 2020-08-16 MED ORDER — FENTANYL CITRATE (PF) 250 MCG/5ML IJ SOLN
INTRAMUSCULAR | Status: DC | PRN
  Administered 2020-08-16: 100 ug via INTRAVENOUS

## 2020-08-16 MED ORDER — GLIPIZIDE ER 5 MG PO TB24
5.0000 mg | ORAL_TABLET | Freq: Every day | ORAL | Status: DC
  Administered 2020-08-17 – 2020-08-18 (×2): 5 mg via ORAL
  Filled 2020-08-16 (×2): qty 1

## 2020-08-16 MED ORDER — PHENYLEPHRINE HCL-NACL 10-0.9 MG/250ML-% IV SOLN
INTRAVENOUS | Status: DC | PRN
  Administered 2020-08-16: 40 ug/min via INTRAVENOUS

## 2020-08-16 MED ORDER — CLOPIDOGREL BISULFATE 75 MG PO TABS
75.0000 mg | ORAL_TABLET | Freq: Every day | ORAL | Status: DC
  Administered 2020-08-17 – 2020-08-18 (×2): 75 mg via ORAL
  Filled 2020-08-16 (×2): qty 1

## 2020-08-16 MED ORDER — LIDOCAINE 2% (20 MG/ML) 5 ML SYRINGE
INTRAMUSCULAR | Status: DC | PRN
  Administered 2020-08-16: 60 mg via INTRAVENOUS

## 2020-08-16 MED ORDER — PROMETHAZINE HCL 25 MG/ML IJ SOLN: 6.2500 mg | INTRAMUSCULAR | Status: DC | PRN

## 2020-08-16 MED ORDER — METOPROLOL TARTRATE 5 MG/5ML IV SOLN: 2.0000 mg | INTRAVENOUS | Status: DC | PRN

## 2020-08-16 MED ORDER — PHENOL 1.4 % MT LIQD: 1.0000 | OROMUCOSAL | Status: DC | PRN

## 2020-08-16 MED ORDER — MAGNESIUM SULFATE 2 GM/50ML IV SOLN: 2.0000 g | Freq: Every day | INTRAVENOUS | Status: DC | PRN

## 2020-08-16 MED ORDER — ACETAMINOPHEN 500 MG PO TABS: 1000.0000 mg | ORAL_TABLET | Freq: Four times a day (QID) | ORAL | Status: DC | PRN

## 2020-08-16 MED ORDER — VITAMIN B-12 1000 MCG PO TABS
500.0000 ug | ORAL_TABLET | Freq: Every day | ORAL | Status: DC
  Administered 2020-08-17 – 2020-08-18 (×2): 500 ug via ORAL
  Filled 2020-08-16 (×2): qty 1

## 2020-08-16 MED ORDER — ACETAMINOPHEN 650 MG RE SUPP: 325.0000 mg | RECTAL | Status: DC | PRN

## 2020-08-16 MED ORDER — ALUM & MAG HYDROXIDE-SIMETH 200-200-20 MG/5ML PO SUSP: 15.0000 mL | ORAL | Status: DC | PRN

## 2020-08-16 MED ORDER — CHLORHEXIDINE GLUCONATE CLOTH 2 % EX PADS: 6.0000 | MEDICATED_PAD | Freq: Once | CUTANEOUS | Status: DC

## 2020-08-16 MED ORDER — PROPOFOL 10 MG/ML IV BOLUS
INTRAVENOUS | Status: AC
  Filled 2020-08-16: qty 20

## 2020-08-16 MED ORDER — ONDANSETRON HCL 4 MG/2ML IJ SOLN: 4.0000 mg | Freq: Four times a day (QID) | INTRAMUSCULAR | Status: DC | PRN

## 2020-08-16 MED ORDER — HYDRALAZINE HCL 20 MG/ML IJ SOLN
5.0000 mg | INTRAMUSCULAR | Status: DC | PRN
  Filled 2020-08-16: qty 0.25

## 2020-08-16 MED ORDER — ROCURONIUM BROMIDE 10 MG/ML (PF) SYRINGE
PREFILLED_SYRINGE | INTRAVENOUS | Status: AC
  Filled 2020-08-16: qty 10

## 2020-08-16 MED ORDER — HEMOSTATIC AGENTS (NO CHARGE) OPTIME
TOPICAL | Status: DC | PRN
  Administered 2020-08-16 (×3): 1 via TOPICAL

## 2020-08-16 MED ORDER — PHENYLEPHRINE 40 MCG/ML (10ML) SYRINGE FOR IV PUSH (FOR BLOOD PRESSURE SUPPORT)
PREFILLED_SYRINGE | INTRAVENOUS | Status: DC | PRN
  Administered 2020-08-16 (×5): 80 ug via INTRAVENOUS

## 2020-08-16 MED ORDER — CALCIUM CARBONATE ANTACID 500 MG PO CHEW: 2.0000 | CHEWABLE_TABLET | Freq: Every day | ORAL | Status: DC | PRN

## 2020-08-16 MED ORDER — CEFAZOLIN SODIUM-DEXTROSE 2-4 GM/100ML-% IV SOLN
2.0000 g | INTRAVENOUS | Status: AC
  Administered 2020-08-16: 2 g via INTRAVENOUS

## 2020-08-16 MED ORDER — OXYCODONE-ACETAMINOPHEN 5-325 MG PO TABS
ORAL_TABLET | ORAL | Status: AC
  Filled 2020-08-16: qty 2

## 2020-08-16 MED ORDER — GUAIFENESIN-DM 100-10 MG/5ML PO SYRP: 15.0000 mL | ORAL_SOLUTION | ORAL | Status: DC | PRN

## 2020-08-16 MED ORDER — SENNOSIDES-DOCUSATE SODIUM 8.6-50 MG PO TABS: 1.0000 | ORAL_TABLET | Freq: Every evening | ORAL | Status: DC | PRN

## 2020-08-16 MED ORDER — ACETAMINOPHEN 500 MG PO TABS
1000.0000 mg | ORAL_TABLET | Freq: Once | ORAL | Status: AC
  Administered 2020-08-16: 1000 mg via ORAL
  Filled 2020-08-16: qty 2

## 2020-08-16 MED ORDER — FENTANYL CITRATE (PF) 250 MCG/5ML IJ SOLN
INTRAMUSCULAR | Status: AC
  Filled 2020-08-16: qty 5

## 2020-08-16 MED ORDER — IRBESARTAN 300 MG PO TABS
300.0000 mg | ORAL_TABLET | Freq: Every day | ORAL | Status: DC
  Administered 2020-08-17 – 2020-08-18 (×2): 300 mg via ORAL
  Filled 2020-08-16 (×2): qty 1

## 2020-08-16 MED ORDER — CEFAZOLIN SODIUM-DEXTROSE 2-4 GM/100ML-% IV SOLN
2.0000 g | Freq: Three times a day (TID) | INTRAVENOUS | Status: AC
  Administered 2020-08-16 (×2): 2 g via INTRAVENOUS
  Filled 2020-08-16 (×2): qty 100

## 2020-08-16 SURGICAL SUPPLY — 60 items
BANDAGE ESMARK 6X9 LF (GAUZE/BANDAGES/DRESSINGS) ×1 IMPLANT
BNDG ESMARK 6X9 LF (GAUZE/BANDAGES/DRESSINGS) ×3
CANISTER SUCT 3000ML PPV (MISCELLANEOUS) ×3 IMPLANT
CANNULA VESSEL 3MM 2 BLNT TIP (CANNULA) IMPLANT
CLIP VESOCCLUDE MED 24/CT (CLIP) ×3 IMPLANT
CLIP VESOCCLUDE SM WIDE 24/CT (CLIP) ×3 IMPLANT
COVER WAND RF STERILE (DRAPES) IMPLANT
CUFF TOURN SGL QUICK 24 (TOURNIQUET CUFF) ×3
CUFF TOURN SGL QUICK 34 (TOURNIQUET CUFF)
CUFF TOURN SGL QUICK 42 (TOURNIQUET CUFF) IMPLANT
CUFF TRNQT CYL 24X4X16.5-23 (TOURNIQUET CUFF) ×1 IMPLANT
CUFF TRNQT CYL 34X4.125X (TOURNIQUET CUFF) IMPLANT
DERMABOND ADVANCED (GAUZE/BANDAGES/DRESSINGS) ×2
DERMABOND ADVANCED .7 DNX12 (GAUZE/BANDAGES/DRESSINGS) ×1 IMPLANT
DRAIN CHANNEL 15F RND FF W/TCR (WOUND CARE) IMPLANT
DRAPE C-ARM 42X72 X-RAY (DRAPES) IMPLANT
DRAPE HALF SHEET 40X57 (DRAPES) IMPLANT
DRAPE X-RAY CASS 24X20 (DRAPES) IMPLANT
DRSG COVADERM 4X6 (GAUZE/BANDAGES/DRESSINGS) ×3 IMPLANT
ELECT REM PT RETURN 9FT ADLT (ELECTROSURGICAL) ×3
ELECTRODE REM PT RTRN 9FT ADLT (ELECTROSURGICAL) ×1 IMPLANT
EVACUATOR SILICONE 100CC (DRAIN) IMPLANT
GLOVE BIO SURGEON STRL SZ7.5 (GLOVE) ×3 IMPLANT
GLOVE BIOGEL PI IND STRL 6.5 (GLOVE) ×2 IMPLANT
GLOVE BIOGEL PI INDICATOR 6.5 (GLOVE) ×4
GOWN STRL REUS W/ TWL LRG LVL3 (GOWN DISPOSABLE) ×2 IMPLANT
GOWN STRL REUS W/ TWL XL LVL3 (GOWN DISPOSABLE) ×1 IMPLANT
GOWN STRL REUS W/TWL LRG LVL3 (GOWN DISPOSABLE) ×6
GOWN STRL REUS W/TWL XL LVL3 (GOWN DISPOSABLE) ×3
GRAFT PROPATEN W/RING 6X80X60 (Vascular Products) ×3 IMPLANT
HEMOSTAT SNOW SURGICEL 2X4 (HEMOSTASIS) ×9 IMPLANT
INSERT FOGARTY SM (MISCELLANEOUS) ×3 IMPLANT
KIT BASIN OR (CUSTOM PROCEDURE TRAY) ×3 IMPLANT
KIT TURNOVER KIT B (KITS) ×3 IMPLANT
MARKER GRAFT CORONARY BYPASS (MISCELLANEOUS) IMPLANT
NS IRRIG 1000ML POUR BTL (IV SOLUTION) ×6 IMPLANT
PACK PERIPHERAL VASCULAR (CUSTOM PROCEDURE TRAY) ×3 IMPLANT
PAD ARMBOARD 7.5X6 YLW CONV (MISCELLANEOUS) ×6 IMPLANT
PATCH VASC XENOSURE 1CMX6CM (Vascular Products) ×3 IMPLANT
PATCH VASC XENOSURE 1X6 (Vascular Products) ×1 IMPLANT
SET COLLECT BLD 21X3/4 12 (NEEDLE) IMPLANT
SPONGE LAP 18X18 RF (DISPOSABLE) ×6 IMPLANT
STOPCOCK 4 WAY LG BORE MALE ST (IV SETS) IMPLANT
SUT ETHILON 3 0 PS 1 (SUTURE) ×3 IMPLANT
SUT MNCRL AB 4-0 PS2 18 (SUTURE) ×6 IMPLANT
SUT PROLENE 5 0 C 1 24 (SUTURE) ×18 IMPLANT
SUT PROLENE 6 0 BV (SUTURE) ×15 IMPLANT
SUT PROLENE 7 0 BV 1 (SUTURE) IMPLANT
SUT SILK 2 0 SH (SUTURE) ×3 IMPLANT
SUT SILK 3 0 (SUTURE) ×3
SUT SILK 3-0 18XBRD TIE 12 (SUTURE) ×1 IMPLANT
SUT VIC AB 2-0 CT1 27 (SUTURE) ×6
SUT VIC AB 2-0 CT1 TAPERPNT 27 (SUTURE) ×2 IMPLANT
SUT VIC AB 3-0 SH 27 (SUTURE) ×6
SUT VIC AB 3-0 SH 27X BRD (SUTURE) ×2 IMPLANT
TOWEL GREEN STERILE (TOWEL DISPOSABLE) ×6 IMPLANT
TOWEL GREEN STERILE FF (TOWEL DISPOSABLE) ×3 IMPLANT
TRAY FOLEY MTR SLVR 16FR STAT (SET/KITS/TRAYS/PACK) ×3 IMPLANT
UNDERPAD 30X36 HEAVY ABSORB (UNDERPADS AND DIAPERS) ×3 IMPLANT
WATER STERILE IRR 1000ML POUR (IV SOLUTION) ×3 IMPLANT

## 2020-08-16 NOTE — Interval H&P Note (Signed)
History and Physical Interval Note:  08/16/2020 7:22 AM  [name]  has presented today for surgery, with the diagnosis of CRITICAL LOWER LIMB ISCHEMIA.  The various methods of treatment have been discussed with the patient and family. After consideration of risks, benefits and other options for treatment, the patient has consented to  Procedure(s): RIGHT FEMORAL ENDARTERECTOMY (Right) BYPASS GRAFT FEMORAL-POPLITEAL ARTERY (Right) as a surgical intervention.  The patient's history has been reviewed, patient examined, no change in status, stable for surgery.  I have reviewed the patient's chart and labs.  Questions were answered to the patient's satisfaction.     Servando Snare

## 2020-08-16 NOTE — Transfer of Care (Signed)
Immediate Anesthesia Transfer of Care Note  Patient: Fontaine H Cancelliere  Procedure(s) Performed: RIGHT FEMORAL ENDARTERECTOMY (Right ) BYPASS GRAFT FEMORAL-POPLITEAL ARTERY (Right )  Patient Location: PACU  Anesthesia Type:General  Level of Consciousness: sedated, patient cooperative and responds to stimulation  Airway & Oxygen Therapy: Patient Spontanous Breathing and Patient connected to face mask oxygen  Post-op Assessment: Report given to RN, Post -op Vital signs reviewed and stable and Patient moving all extremities  Post vital signs: Reviewed and stable  Last Vitals:  Vitals Value Taken Time  BP 132/41 08/16/20 1106  Temp    Pulse 78 08/16/20 1106  Resp 11 08/16/20 1106  SpO2 100 % 08/16/20 1106  Vitals shown include unvalidated device data.  Last Pain:  Vitals:   08/16/20 0635  TempSrc:   PainSc: 0-No pain         Complications: No complications documented.

## 2020-08-16 NOTE — Progress Notes (Addendum)
  Progress Note    08/16/2020 3:56 PM Day of Surgery  Subjective:  Sleepy post operatively but comfortable    Vitals:   08/16/20 1405 08/16/20 1456  BP: (!) 121/50 (!) 134/45  Pulse: 80 84  Resp: (!) 21 15  Temp: 97.6 F (36.4 C) (!) 97.5 F (36.4 C)  SpO2: 99% 96%   Physical Exam: Cardiac:  Regular Lungs:  Non labored Incisions:  Right groin and right leg incisions intact and dry. Soft around right groin. No drainage or bleeding. Applied dry gauze to right groin Extremities:  BLE well perfused and warm. Brisk right peroneal signal right foot Neurologic: sleepy but oriented x3   CBC    Component Value Date/Time   WBC 10.4 08/10/2020 0951   RBC 4.62 08/10/2020 0951   HGB 12.7 08/10/2020 0951   HGB 12.2 07/29/2020 0942   HCT 40.5 08/10/2020 0951   HCT 37.4 07/29/2020 0942   PLT 315 08/10/2020 0951   PLT 304 07/29/2020 0942   MCV 87.7 08/10/2020 0951   MCV 84 07/29/2020 0942   MCH 27.5 08/10/2020 0951   MCHC 31.4 08/10/2020 0951   RDW 15.5 08/10/2020 0951   RDW 14.8 07/29/2020 0942   LYMPHSABS 2,604 12/09/2019 1136   MONOABS 0.9 11/01/2019 1730   EOSABS 218 12/09/2019 1136   BASOSABS 89 12/09/2019 1136    BMET    Component Value Date/Time   NA 139 08/10/2020 0951   NA 142 07/29/2020 0942   K 4.4 08/10/2020 0951   CL 104 08/10/2020 0951   CO2 23 08/10/2020 0951   GLUCOSE 200 (H) 08/10/2020 0951   BUN 26 (H) 08/10/2020 0951   BUN 17 07/29/2020 0942   CREATININE 1.11 (H) 08/10/2020 0951   CREATININE 0.86 12/09/2019 1136   CALCIUM 10.4 (H) 08/10/2020 0951   GFRNONAA 51 (L) 08/10/2020 0951   GFRAA 75 07/29/2020 0942    INR    Component Value Date/Time   INR 1.0 08/10/2020 0951     Intake/Output Summary (Last 24 hours) at 08/16/2020 1556 Last data filed at 08/16/2020 1424 Gross per 24 hour  Intake 1850 ml  Output 875 ml  Net 975 ml     Assessment/Plan:  79 y.o. female is s/p Reexposure right common femoral artery, Right common femoral  artery endarterectomy with bovine pericardial patch angioplasty, right common femoral to above knee popliteal artery bypass with PTFE Day of Surgery. Patient is doing well post op. Pain overall well controlled. Incisions are intact in RLE. Apply dry gauze to right groin. Dopper Peroneal signal right foot. Advance diet as tolerate. Okay to mobilize as tolerated  DVT prophylaxis:  SCDs   [name], [facility] [phone] 08/16/2020 3:56 PM   I have independently interviewed and examined patient and agree with PA assessment and plan above.   Alister Staver C. Donzetta Matters, MD [facility] Office: [phone] Pager: [phone]

## 2020-08-16 NOTE — Anesthesia Procedure Notes (Signed)
Procedure Name: Intubation Date/Time: 08/16/2020 7:36 AM Performed by: Myna Bright, CRNA Pre-anesthesia Checklist: Patient identified, Emergency Drugs available, Suction available and Patient being monitored Patient Re-evaluated:Patient Re-evaluated prior to induction Oxygen Delivery Method: Circle system utilized Preoxygenation: Pre-oxygenation with 100% oxygen Induction Type: IV induction Ventilation: Mask ventilation without difficulty Laryngoscope Size: Mac and 3 Grade View: Grade I Tube type: Oral Tube size: 7.0 mm Number of attempts: 1 Airway Equipment and Method: Stylet Placement Confirmation: ETT inserted through vocal cords under direct vision,  positive ETCO2 and breath sounds checked- equal and bilateral Secured at: 21 cm Tube secured with: Tape Dental Injury: Teeth and Oropharynx as per pre-operative assessment

## 2020-08-16 NOTE — Progress Notes (Signed)
Pt arrived from unit from PACU. HR 97 Sp02 96 RA RR 17 BP 134/45 (72) .Will continue to monitor. Pt. Oriented to unit   Phoebe Sharps, RN

## 2020-08-16 NOTE — Anesthesia Postprocedure Evaluation (Signed)
Anesthesia Post Note  Patient: [name]  Procedure(s) Performed: RIGHT FEMORAL ENDARTERECTOMY (Right ) BYPASS GRAFT FEMORAL-POPLITEAL ARTERY (Right )     Patient location during evaluation: PACU Anesthesia Type: General Level of consciousness: sedated Pain management: pain level controlled Vital Signs Assessment: post-procedure vital signs reviewed and stable Respiratory status: spontaneous breathing and respiratory function stable Cardiovascular status: stable Postop Assessment: no apparent nausea or vomiting Anesthetic complications: no   No complications documented.  Last Vitals:  Vitals:   08/16/20 1320 08/16/20 1335  BP: (!) 114/41 (!) 110/42  Pulse: 76 74  Resp: 11 13  Temp:    SpO2: 97% 97%    Last Pain:  Vitals:   08/16/20 1315  TempSrc:   PainSc: Asleep                 Danyele Smejkal DANIEL

## 2020-08-16 NOTE — Op Note (Signed)
Patient name: [name] MRN: [id number] DOB: 12-27-1940 Sex: female  08/16/2020 Pre-operative Diagnosis: Critical right lower extremity ischemia with wound Post-operative diagnosis:  Same Surgeon:  Erlene Quan C. Donzetta Matters, MD Assistants: Laurence Slate, PA; Margot Chimes, MS 3 Procedure Performed: 1.  Reexposure right common femoral artery greater than 30 days 2.  Right common femoral endarterectomy with bovine pericardial patch angioplasty 3.  Right common femoral to above-knee popliteal artery bypass with 6 mm ringed PTFE  Indications: 79 year old female with history of a right common femoral endarterectomy with patch angioplasty.  She recently had a saphenous vein harvested for coronary artery bypass grafting.  This wound has failed to heal she also has a previous transmetatarsal amputation which now has drainage from the medial aspect.  She is indicated for the above-noted procedure.  An assistant was necessary for suction, retraction, anastomotic assistance as well as wound closure.  Findings: There was significant dense scar tissue in the right groin.  The common femoral artery had no pulse initially.  This was a combination of hyperplastic as well as calcified tissue.  The external iliac artery was heavily calcified this was really not amenable to clamping.  There was hyperplastic tissue into the profunda this was removed with endarterectomy.  The SFA was chronically occluded.  After endarterectomy and patch angioplasty with strong pulsatility.  The graft had pulsatility to the above-the-knee popliteal artery.  This was heavily calcified.  We were able to dilate the above-knee popliteal artery to 3 mm distal to our arteriotomy.  At completion there was a strong peroneal artery signal I was graft dependent at the ankle.   Procedure:  The patient was identified in the holding area and taken to the operating room where she was placed in bilateral table general anesthesia was induced.  She was  sterilely prepped draped in the right lower extremity usual fashion antibiotics were minister timeout was called.  We began by opening her previous incision in her groin.  We dissected down through dense scar tissue.  We extended the incision 1 cm cephalad and caudally.  We dissected down to the inguinal ligament.  There was no pulsatility in the previous endarterectomy site.  We dissected distally to find the SFA and worked retrograde.  We identified our profunda dissected up under her regular ligament to our external iliac artery placed a vessel loop around this.  We also placed a vessel loop around the SFA.  We then turned our attention distally we made an incision above the knee.  We dissected down to heavily calcified popliteal artery placed a vessel loop around this.  We tunneled it in a subsartorial plane between the 2 incisions patient was fully heparinized.  We then clamped our external leg artery.  We opened the common femoral artery longitudinally.  This was heavily hyperplastic there was some calcium we performed extensive endarterectomy.  We had very good inflow that we checked and we had good backbleeding from the profunda.  We performed patch angioplasty with bovine pericardium.  We then released our clamps we had good flow into our profunda checked with Doppler.  We then reclamped open the bovine pericardial patch.  We tunneled a 6 mm PTFE ringed graft.  We trimmed this to size and sewed end-to-side to the common femoral artery.  We then released our clamps we flushed through the graft with strong pulsatility through our graft.  The graft was clamped we had good flow into the profunda.  We then placed the tourniquet  up at the level of the thigh exsanguinated the leg with Esmarch and inflated our tourniquet 250 mmHg.  We opened her popliteal artery longitudinally.  There was extensive calcium there we did perform limited endarterectomy.  We dilated serially up to 3 mm distally this went easily.  We  then straighten her leg turned our graft to size sewn end-to-side with 6-0 Prolene suture.  Prior completion allowed our tourniquet down.  We then flushed all directions.  We then had good signal distally.  We had good signal Doppler in the popliteal distally that was graft dependent.  We also demonstrated peroneal artery signals graft dependent.  With this we administered 50 mg of protamine.  We obtained meticulous hemostasis and irrigated all wounds and closed in layers with Vicryl Monocryl.  She was awake from anesthesia having tolerated procedure without any complication but all counts were correct at completion.  EBL: 250 cc  Patricio Popwell C. Donzetta Matters, MD [facility] [place] Office: [phone] Pager: [phone]

## 2020-08-17 ENCOUNTER — Encounter (HOSPITAL_COMMUNITY): Payer: Self-pay | Admitting: Vascular Surgery

## 2020-08-17 ENCOUNTER — Other Ambulatory Visit: Payer: Self-pay | Admitting: *Deleted

## 2020-08-17 ENCOUNTER — Ambulatory Visit: Payer: Medicare Other | Admitting: Cardiovascular Disease

## 2020-08-17 DIAGNOSIS — I70235 Atherosclerosis of native arteries of right leg with ulceration of other part of foot: Secondary | ICD-10-CM

## 2020-08-17 LAB — GLUCOSE, CAPILLARY
Glucose-Capillary: 156 mg/dL — ABNORMAL HIGH (ref 70–99)
Glucose-Capillary: 155 mg/dL — ABNORMAL HIGH (ref 70–99)
Glucose-Capillary: 134 mg/dL — ABNORMAL HIGH (ref 70–99)
Glucose-Capillary: 184 mg/dL — ABNORMAL HIGH (ref 70–99)
Glucose-Capillary: 172 mg/dL — ABNORMAL HIGH (ref 70–99)
Glucose-Capillary: 155 mg/dL — ABNORMAL HIGH (ref 70–99)

## 2020-08-17 LAB — BASIC METABOLIC PANEL
Anion gap: 9 (ref 5–15)
BUN: 31 mg/dL — ABNORMAL HIGH (ref 8–23)
CO2: 23 mmol/L (ref 22–32)
Calcium: 8.3 mg/dL — ABNORMAL LOW (ref 8.9–10.3)
Chloride: 106 mmol/L (ref 98–111)
Creatinine, Ser: 1.09 mg/dL — ABNORMAL HIGH (ref 0.44–1.00)
GFR, Estimated: 52 mL/min — ABNORMAL LOW (ref >60)
Glucose, Bld: 184 mg/dL — ABNORMAL HIGH (ref 70–99)
Potassium: 4.3 mmol/L (ref 3.5–5.1)
Sodium: 138 mmol/L (ref 135–145)

## 2020-08-17 LAB — CBC
HCT: 23.7 % — ABNORMAL LOW (ref 36.0–46.0)
Hemoglobin: 7.6 g/dL — ABNORMAL LOW (ref 12.0–15.0)
MCH: 27.6 pg (ref 26.0–34.0)
MCHC: 32.1 g/dL (ref 30.0–36.0)
MCV: 86.2 fL (ref 80.0–100.0)
Platelets: 197 10*3/uL (ref 150–400)
RBC: 2.75 MIL/uL — ABNORMAL LOW (ref 3.87–5.11)
RDW: 15.6 % — ABNORMAL HIGH (ref 11.5–15.5)
WBC: 12.4 10*3/uL — ABNORMAL HIGH (ref 4.0–10.5)
nRBC: 0 % (ref 0.0–0.2)

## 2020-08-17 LAB — LIPID PANEL
Cholesterol: 77 mg/dL (ref 0–200)
HDL: 31 mg/dL — ABNORMAL LOW (ref >40)
LDL Cholesterol: 33 mg/dL (ref 0–99)
Total CHOL/HDL Ratio: 2.5 RATIO
Triglycerides: 63 mg/dL (ref <150)
VLDL: 13 mg/dL (ref 0–40)

## 2020-08-17 MED ORDER — ALUM & MAG HYDROXIDE-SIMETH 200-200-20 MG/5ML PO SUSP: 15.0000 mL | ORAL | Status: DC | PRN

## 2020-08-17 NOTE — Evaluation (Signed)
Physical Therapy Evaluation Patient Details Name: GLORIOUS FLICKER MRN: [id number] DOB: 1940-10-19 Today's Date: 08/17/2020   History of Present Illness  Maram H Dolin  has presented today for surgery, with the diagnosis of CRITICAL LOWER LIMB ISCHEMIA.  RIGHT FEMORAL ENDARTERECTOMY (Right)BYPASS GRAFT FEMORAL-POPLITEAL ARTERY (Right) as a surgical intervention.    Clinical Impression  Patient received in recliner. Reports she had pain medicine about 30 min prior. Initially painful and stiff with mobility. She requires supervision for sit to stand. Ambulated 200 feet with RW and supervision. Quality of gait improved with increased distance. Patient reports feeling weak, but no dizziness. HGB 7.6 this am. She will continue to benefit from PT while here to improve functional mobility and independence for safe return home.      Follow Up Recommendations No PT follow up    Equipment Recommendations  None recommended by PT    Recommendations for Other Services       Precautions / Restrictions Precautions Precautions: None Restrictions Weight Bearing Restrictions: No      Mobility  Bed Mobility               General bed mobility comments: patient up in recliner upon arrival and remained in recliner    Transfers Overall transfer level: Needs assistance Equipment used: Rolling walker (2 wheeled) Transfers: Sit to/from Stand Sit to Stand: Supervision            Ambulation/Gait Ambulation/Gait assistance: Supervision Gait Distance (Feet): 200 Feet Assistive device: Rolling walker (2 wheeled) Gait Pattern/deviations: Step-through pattern;Decreased weight shift to right Gait velocity: decreased   General Gait Details: patient initially antaligc with gait, as distance increased gait quality improved.  Stairs            Wheelchair Mobility    Modified Rankin (Stroke Patients Only)       Balance Overall balance assessment: Modified Independent                                            Pertinent Vitals/Pain Pain Assessment: Faces Faces Pain Scale: Hurts little more Pain Location: R LE Pain Descriptors / Indicators: Aching;Discomfort;Sore;Operative site guarding Pain Intervention(s): Limited activity within patient's tolerance;Monitored during session;Premedicated before session    Home Living Family/patient expects to be discharged to:: Private residence Living Arrangements: Children Available Help at Discharge: Family;Available PRN/intermittently Type of Home: House Home Access: Level entry     Home Layout: One level Home Equipment: Walker - 2 wheels;Bedside commode      Prior Function Level of Independence: Independent         Comments: does not use ad, drives, fully independent     Hand Dominance        Extremity/Trunk Assessment   Upper Extremity Assessment Upper Extremity Assessment: Defer to OT evaluation    Lower Extremity Assessment Lower Extremity Assessment: RLE deficits/detail RLE Sensation: WNL RLE Coordination: decreased gross motor    Cervical / Trunk Assessment Cervical / Trunk Assessment: Normal  Communication   Communication: No difficulties  Cognition Arousal/Alertness: Awake/alert Behavior During Therapy: WFL for tasks assessed/performed Overall Cognitive Status: Within Functional Limits for tasks assessed                                        General Comments  Exercises     Assessment/Plan    PT Assessment Patient needs continued PT services  PT Problem List Decreased strength;Decreased mobility;Decreased activity tolerance;Decreased balance;Pain       PT Treatment Interventions Gait training;Therapeutic exercise;Patient/family education    PT Goals (Current goals can be found in the Care Plan section)  Acute Rehab PT Goals Patient Stated Goal: to return home tomorrow PT Goal Formulation: With patient Time For Goal Achievement:  08/20/20 Potential to Achieve Goals: Good    Frequency Min 3X/week   Barriers to discharge        Co-evaluation               AM-PAC PT "6 Clicks" Mobility  Outcome Measure Help needed turning from your back to your side while in a flat bed without using bedrails?: None Help needed moving from lying on your back to sitting on the side of a flat bed without using bedrails?: None Help needed moving to and from a bed to a chair (including a wheelchair)?: None Help needed standing up from a chair using your arms (e.g., wheelchair or bedside chair)?: None Help needed to walk in hospital room?: A Little Help needed climbing 3-5 steps with a railing? : A Little 6 Click Score: 22    End of Session   Activity Tolerance: Patient tolerated treatment well;Patient limited by pain Patient left: in chair;with call bell/phone within reach Nurse Communication: Mobility status PT Visit Diagnosis: Muscle weakness (generalized) (M62.81);Pain;Difficulty in walking, not elsewhere classified (R26.2) Pain - Right/Left: Right Pain - part of body: Leg    Time: 1000-1023 PT Time Calculation (min) (ACUTE ONLY): 23 min   Charges:   PT Evaluation $PT Eval Moderate Complexity: 1 Mod PT Treatments $Gait Training: 8-22 mins        Argyle Gustafson, PT, GCS 08/17/20,10:56 AM

## 2020-08-17 NOTE — Discharge Instructions (Signed)
 [facility]  Discharge instructions  Lower Extremity Bypass Surgery  Please refer to the following instruction for your post-procedure care. Your surgeon or physician assistant will discuss any changes with you.  Activity  You are encouraged to walk as much as you can. You can slowly return to normal activities during the month after your surgery. Avoid strenuous activity and heavy lifting until your doctor tells you it's OK. Avoid activities such as vacuuming or swinging a golf club. Do not drive until your doctor give the OK and you are no longer taking prescription pain medications. It is also normal to have difficulty with sleep habits, eating and bowel movement after surgery. These will go away with time.  Bathing/Showering  Shower daily after you go home. Do not soak in a bathtub, hot tub, or swim until the incision heals completely.  Incision Care  Clean your incision with mild soap and water. Shower every day. Pat the area dry with a clean towel. You do not need a bandage unless otherwise instructed. Do not apply any ointments or creams to your incision. If you have open wounds you will be instructed how to care for them or a visiting nurse may be arranged for you. If you have staples or sutures along your incision they will be removed at your post-op appointment. You may have skin glue on your incision. Do not peel it off. It will come off on its own in about one week.  Wash the groin wound with soap and water daily and pat dry. (No tub bath-only shower)  Then put a dry gauze or washcloth in the groin to keep this area dry to help prevent wound infection.  Do this daily and as needed.  Do not use Vaseline or neosporin on your incisions.  Only use soap and water on your incisions and then protect and keep dry.  Diet  Resume your normal diet. There are no special food restrictions following this procedure. A low fat/ low cholesterol diet is  recommended for all patients with vascular disease. In order to heal from your surgery, it is CRITICAL to get adequate nutrition. Your body requires vitamins, minerals, and protein. Vegetables are the best source of vitamins and minerals. Vegetables also provide the perfect balance of protein. Processed food has little nutritional value, so try to avoid this.  Medications  Resume taking all your medications unless your doctor or physician assistant tells you not to. If your incision is causing pain, you may take over-the-counter pain relievers such as acetaminophen (Tylenol). If you were prescribed a stronger pain medication, please aware these medication can cause nausea and constipation. Prevent nausea by taking the medication with a snack or meal. Avoid constipation by drinking plenty of fluids and eating foods with high amount of fiber, such as fruits, vegetables, and grains. Take Colace 100 mg (an over-the-counter stool softener) twice a day as needed for constipation.  Do not take Tylenol if you are taking prescription pain medications.  Follow Up  Our office will schedule a follow up appointment 2-3 weeks following discharge.  Please call us immediately for any of the following conditions  Severe or worsening pain in your legs or feet while at rest or while walking Increase pain, redness, warmth, or drainage (pus) from your incision site(s) Fever of 101 degree or higher The swelling in your leg with the bypass suddenly worsens and becomes more painful than when you were in the hospital If you have   been instructed to feel your graft pulse then you should do so every day. If you can no longer feel this pulse, call the office immediately. Not all patients are given this instruction.  Leg swelling is common after leg bypass surgery.  The swelling should improve over a few months following surgery. To improve the swelling, you may elevate your legs above the level of your heart while you are  sitting or resting. Your surgeon or physician assistant may ask you to apply an ACE wrap or wear compression (TED) stockings to help to reduce swelling.  Reduce your risk of vascular disease  Stop smoking. If you would like help call [facility] at 1-800-QUIT-NOW (1-800-784-8669) or [facility] at [phone].  Manage your cholesterol Maintain a desired weight Control your diabetes weight Control your diabetes Keep your blood pressure down  If you have any questions, please call the office at [phone]  

## 2020-08-17 NOTE — Progress Notes (Addendum)
Progress Note    08/17/2020 7:41 AM 1 Day Post-Op  Subjective:  Sitting up in chair eating breakfast. States pain overall tolerable. She ambulated in hallway last night   Vitals:   08/17/20 0000 08/17/20 0444  BP: (!) 101/42 (!) 105/58  Pulse: 79 78  Resp: 15 14  Temp: 97.6 F (36.4 C) 97.8 F (36.6 C)  SpO2: 100% 100%   Physical Exam: Cardiac:  Regular rate and rhythm Lungs:  Non labored Incisions:  Right groin incision and thigh incisions clean, dry and intact. Applied dry gauze to right groin Extremities:  Right lower extremity well perfused and warm. Brisk doppler peroneal signal. TMA with small scab present. No signs of infection Neurologic: alert and oriented  CBC    Component Value Date/Time   WBC 12.4 (H) 08/17/2020 0055   RBC 2.75 (L) 08/17/2020 0055   HGB 7.6 (L) 08/17/2020 0055   HGB 12.2 07/29/2020 0942   HCT 23.7 (L) 08/17/2020 0055   HCT 37.4 07/29/2020 0942   PLT 197 08/17/2020 0055   PLT 304 07/29/2020 0942   MCV 86.2 08/17/2020 0055   MCV 84 07/29/2020 0942   MCH 27.6 08/17/2020 0055   MCHC 32.1 08/17/2020 0055   RDW 15.6 (H) 08/17/2020 0055   RDW 14.8 07/29/2020 0942   LYMPHSABS 2,604 12/09/2019 1136   MONOABS 0.9 11/01/2019 1730   EOSABS 218 12/09/2019 1136   BASOSABS 89 12/09/2019 1136    BMET    Component Value Date/Time   NA 138 08/17/2020 0055   NA 142 07/29/2020 0942   K 4.3 08/17/2020 0055   CL 106 08/17/2020 0055   CO2 23 08/17/2020 0055   GLUCOSE 184 (H) 08/17/2020 0055   BUN 31 (H) 08/17/2020 0055   BUN 17 07/29/2020 0942   CREATININE 1.09 (H) 08/17/2020 0055   CREATININE 0.86 12/09/2019 1136   CALCIUM 8.3 (L) 08/17/2020 0055   GFRNONAA 52 (L) 08/17/2020 0055   GFRAA 75 07/29/2020 0942    INR    Component Value Date/Time   INR 1.0 08/10/2020 0951     Intake/Output Summary (Last 24 hours) at 08/17/2020 0741 Last data filed at 08/17/2020 0620 Gross per 24 hour  Intake 2310.06 ml  Output 1775 ml  Net 535.06 ml      Assessment/Plan:  79 y.o. female is s/p Reexposure right common femoral artery, Right common femoral artery endarterectomy with bovine pericardial patch angioplasty, right common femoral to above knee popliteal artery bypass with PTFE Day of Surgery 1 Day Post-Op. She is doing well post op. Her hemoglobin has decreased to 7.6 otherwise hemodynamically stable. Will get morning labs to follow.  Right lower extremity is well perfused with brisk Pero signal. RLE incisions are intact. Keep dry gauze in Right groin to prevent breakdown. Pain well controlled. Ambulated in hall. Tolerating diet. Continue to mobilize today. Possible discharge tomorrow   DVT prophylaxis:  Sq heparin    [name], PA-C [facility] [phone] 08/17/2020 7:41 AM   VASCULAR STAFF ADDENDUM: I have independently interviewed and examined the patient. I agree with the above.  Looks great POD#1 s/p right common femoral endarterectomy and femoral - above knee popliteal bypass with ePTFE. No complaints. Already up and walking. Pain well controlled. AF/VSS. Good UOP. Incisions clean and dry. Brisk AT DS. Hgb 7.6. Monitor. Anticipate DC tomorrow if AM labs look OK.  [name]. Stanford Breed, MD [facility] Phone Number: [phone] 08/17/2020 10:04 AM

## 2020-08-17 NOTE — Progress Notes (Signed)
Mobility Specialist: Progress Note   08/17/20 1527  Mobility  Activity Ambulated in hall  Level of Assistance Modified independent, requires aide device or extra time  Assistive Device Front wheel walker  Distance Ambulated (ft) 360 ft  Mobility Response Tolerated well  Mobility performed by Mobility specialist  Bed Position Semi-fowlers  $Mobility charge 1 Mobility   Pre-Mobility:   Sitting: 77 HR, 98/40 BP  Standing: 101/44 BP Post-Mobility: 86 HR, 114/45 BP, 100% SpO2  Pt c/o of tightness in her R leg at incision site during ambulation. Pt says she continues to feel better each [name].   [facility] [name] Mobility Specialist

## 2020-08-17 NOTE — Evaluation (Signed)
Occupational Therapy Evaluation Patient Details Name: [name] MRN: [id number] DOB: 1941/07/05 Today's Date: 08/17/2020    History of Present Illness [name]  has presented today for surgery, with the diagnosis of CRITICAL LOWER LIMB ISCHEMIA.  RIGHT FEMORAL ENDARTERECTOMY (Right)BYPASS GRAFT FEMORAL-POPLITEAL ARTERY (Right) as a surgical intervention.   Clinical Impression   Pt was independent prior to admission. She currently requires supervision with RW for OOB mobility and min assist for LB ADL. She will rely on her supportive daughter to assist. Pt has all necessary DME. No further OT needs.     Follow Up Recommendations  No OT follow up    Equipment Recommendations  None recommended by OT    Recommendations for Other Services       Precautions / Restrictions Precautions Precautions: None Restrictions Weight Bearing Restrictions: No      Mobility Bed Mobility               General bed mobility comments: patient up in recliner upon arrival and remained in recliner    Transfers Overall transfer level: Needs assistance Equipment used: Rolling walker (2 wheeled) Transfers: Sit to/from Stand Sit to Stand: Supervision              Balance Overall balance assessment: Modified Independent                                         ADL either performed or assessed with clinical judgement   ADL                                         General ADL Comments: Pt is functioning at a supervision level with exception of bathing and dressing her R foot, will rely on her daughter's assist. Pt is aware she can use the 3 in 1 as a shower seat if she deems neccessary.      Vision Baseline Vision/History: Wears glasses Wears Glasses: Reading only Patient Visual Report: No change from baseline       Perception     Praxis      Pertinent Vitals/Pain Pain Assessment: Faces Faces Pain Scale: Hurts little more Pain  Location: R LE Pain Descriptors / Indicators: Aching;Discomfort;Sore;Operative site guarding Pain Intervention(s): Repositioned;RN gave pain meds during session     Hand Dominance Right   Extremity/Trunk Assessment Upper Extremity Assessment Upper Extremity Assessment: Overall WFL for tasks assessed   Lower Extremity Assessment Lower Extremity Assessment: Defer to PT evaluation RLE Sensation: WNL RLE Coordination: decreased gross motor   Cervical / Trunk Assessment Cervical / Trunk Assessment: Normal   Communication Communication Communication: No difficulties   Cognition Arousal/Alertness: Awake/alert Behavior During Therapy: WFL for tasks assessed/performed Overall Cognitive Status: Within Functional Limits for tasks assessed                                     General Comments       Exercises     Shoulder Instructions      Home Living Family/patient expects to be discharged to:: Private residence Living Arrangements: Children Available Help at Discharge: Family;Available PRN/intermittently Type of Home: House Home Access: Level entry     Home Layout: One level  Bathroom Shower/Tub: Teacher, early years/pre: Handicapped height     Home Equipment: Environmental consultant - 2 wheels;Bedside commode;Grab bars - tub/shower          Prior Functioning/Environment Level of Independence: Independent        Comments: does not use ad, drives, fully independent        OT Problem List:        OT Treatment/Interventions:      OT Goals(Current goals can be found in the care plan section) Acute Rehab OT Goals Patient Stated Goal: to return home tomorrow  OT Frequency:     Barriers to D/C:            Co-evaluation              AM-PAC OT "6 Clicks" Daily Activity     Outcome Measure Help from another person eating meals?: None Help from another person taking care of personal grooming?: A Little Help from another person toileting,  which includes using toliet, bedpan, or urinal?: A Little Help from another person bathing (including washing, rinsing, drying)?: A Little Help from another person to put on and taking off regular upper body clothing?: None Help from another person to put on and taking off regular lower body clothing?: A Little 6 Click Score: 20   End of Session    Activity Tolerance: Patient tolerated treatment well Patient left: in chair;with call bell/phone within reach  OT Visit Diagnosis: Unsteadiness on feet (R26.81);Pain                Time: 8756-4332 OT Time Calculation (min): 11 min Charges:  OT General Charges $OT Visit: 1 Visit OT Evaluation $OT Eval Low Complexity: 1 Low  [name], OTR/L Acute Rehabilitation Services Pager: [phone] Office: [phone]  [name] 08/17/2020, 11:12 AM

## 2020-08-17 NOTE — Progress Notes (Signed)
PHARMACIST LIPID MONITORING   [name] is a 79 y.o. female admitted on 08/16/2020 with PV revascularization. Pharmacy has been consulted to optimize lipid-lowering therapy with the indication of secondary prevention for clinical ASCVD.  Recent Labs:  Lipid Panel (last 6 months):   Lab Results  Component Value Date   CHOL 77 08/17/2020   TRIG 63 08/17/2020   HDL 31 (L) 08/17/2020   CHOLHDL 2.5 08/17/2020   VLDL 13 08/17/2020   LDLCALC 33 08/17/2020    Hepatic function panel (last 6 months):   Lab Results  Component Value Date   AST 25 08/10/2020   ALT 30 08/10/2020   ALKPHOS 62 08/10/2020   BILITOT 0.5 08/10/2020   BILIDIR <0.10 07/28/2020    SCr (since admission):   Serum creatinine: 1.09 mg/dL (H) 08/17/20 0055 Estimated creatinine clearance: 37.5 mL/min (A)  Current lipid-lowering therapy: atorvastatin 80mg  Previous lipid-lowering therapies (if applicable): n/a Documented or reported allergies or intolerances to lipid-lowering therapies (if applicable): none  Assessment:  Patient prefers no changes in lipid-lowering therapy at this time due to LDL well-controlled, working on diet, and atorvastatin dose increased 6 weeks prior.  Recommendation per protocol:  Continue current lipid-lowering therapy.   Follow-up with:  Cardiology provider - [name], MD  Follow-up labs after discharge:    Liver function panel and lipid panel in 8-12 weeks then annually  Plan: -No changes for now  Arrie Senate, PharmD, BCPS, [facility] Clinical Pharmacist [phone] Please check AMION for all [place] numbers 08/17/2020

## 2020-08-18 LAB — BASIC METABOLIC PANEL
Anion gap: 8 (ref 5–15)
BUN: 29 mg/dL — ABNORMAL HIGH (ref 8–23)
CO2: 23 mmol/L (ref 22–32)
Calcium: 8.7 mg/dL — ABNORMAL LOW (ref 8.9–10.3)
Chloride: 105 mmol/L (ref 98–111)
Creatinine, Ser: 0.95 mg/dL (ref 0.44–1.00)
GFR, Estimated: >60 mL/min (ref >60)
Glucose, Bld: 163 mg/dL — ABNORMAL HIGH (ref 70–99)
Potassium: 4.5 mmol/L (ref 3.5–5.1)
Sodium: 136 mmol/L (ref 135–145)

## 2020-08-18 LAB — CBC
HCT: 22.6 % — ABNORMAL LOW (ref 36.0–46.0)
Hemoglobin: 7 g/dL — ABNORMAL LOW (ref 12.0–15.0)
MCH: 27 pg (ref 26.0–34.0)
MCHC: 31 g/dL (ref 30.0–36.0)
MCV: 87.3 fL (ref 80.0–100.0)
Platelets: 181 10*3/uL (ref 150–400)
RBC: 2.59 MIL/uL — ABNORMAL LOW (ref 3.87–5.11)
RDW: 15.6 % — ABNORMAL HIGH (ref 11.5–15.5)
WBC: 11.5 10*3/uL — ABNORMAL HIGH (ref 4.0–10.5)
nRBC: 0 % (ref 0.0–0.2)

## 2020-08-18 LAB — GLUCOSE, CAPILLARY: Glucose-Capillary: 125 mg/dL — ABNORMAL HIGH (ref 70–99)

## 2020-08-18 LAB — PREPARE RBC (CROSSMATCH)

## 2020-08-18 MED ORDER — SENNOSIDES-DOCUSATE SODIUM 8.6-50 MG PO TABS: 1.0000 | ORAL_TABLET | Freq: Every day | ORAL | 3 refills | Status: DC

## 2020-08-18 MED ORDER — FERROUS SULFATE 325 (65 FE) MG PO TABS: 325.0000 mg | ORAL_TABLET | Freq: Every day | ORAL | 3 refills | Status: DC

## 2020-08-18 MED ORDER — SODIUM CHLORIDE 0.9% IV SOLUTION: Freq: Once | INTRAVENOUS | Status: AC

## 2020-08-18 MED ORDER — OXYCODONE-ACETAMINOPHEN 5-325 MG PO TABS
1.0000 | ORAL_TABLET | ORAL | 0 refills | Status: DC | PRN
Start: 2020-08-18 — End: 2020-12-20

## 2020-08-18 NOTE — Progress Notes (Signed)
[facility] [place]  Subjective  - No complaints of dizziness or weakness.     Objective (!) 97/40 98 97.6 F (36.4 C) (Oral) 15 100%  Intake/Output Summary (Last 24 hours) at 08/18/2020 0743 Last data filed at 08/17/2020 1922 Gross per 24 hour  Intake 360 ml  Output --  Net 360 ml    Doppler PT/DP signals brisk Right groin incision healing well Lungs non labored breathing Heart 98 with BP 97/40  Assessment/Planning: POD # 2 79 y.o. female is s/p Reexposure right common femoral artery, Right common femoral artery endarterectomy with bovine pericardial patch angioplasty, right common femoral to above knee popliteal artery bypass with PTFEDay   She is doing well with ambulation, tolerating PO's with N/V, pain well controlled.   HGB 7.0 this am will transfuse 1 unit PRBC if she tolerates this well she will be discharged home.  F/U in 2-3 weeks with Dr. Fuller Plan 08/18/2020 7:43 AM --  Laboratory Lab Results: Recent Labs    08/17/20 0055 08/18/20 0114  WBC 12.4* 11.5*  HGB 7.6* 7.0*  HCT 23.7* 22.6*  PLT 197 181   BMET Recent Labs    08/17/20 0055 08/18/20 0114  NA 138 136  K 4.3 4.5  CL 106 105  CO2 23 23  GLUCOSE 184* 163*  BUN 31* 29*  CREATININE 1.09* 0.95  CALCIUM 8.3* 8.7*    COAG Lab Results  Component Value Date   INR 1.0 08/10/2020   INR 1.8 (H) 03/24/2020   INR 1.1 03/24/2020   No results found for: PTT

## 2020-08-19 LAB — TYPE AND SCREEN
ABO/RH(D): O POS
Antibody Screen: NEGATIVE
Unit division: 0

## 2020-08-19 LAB — BPAM RBC
Blood Product Expiration Date: 202112252359
ISSUE DATE / TIME: 202111250814
Unit Type and Rh: 5100

## 2020-08-22 ENCOUNTER — Other Ambulatory Visit: Payer: Self-pay | Admitting: Cardiology

## 2020-08-22 DIAGNOSIS — E113493 Type 2 diabetes mellitus with severe nonproliferative diabetic retinopathy without macular edema, bilateral: Secondary | ICD-10-CM | POA: Diagnosis not present

## 2020-08-22 DIAGNOSIS — Z95820 Peripheral vascular angioplasty status with implants and grafts: Secondary | ICD-10-CM | POA: Diagnosis not present

## 2020-08-22 DIAGNOSIS — Z7984 Long term (current) use of oral hypoglycemic drugs: Secondary | ICD-10-CM | POA: Diagnosis not present

## 2020-08-22 DIAGNOSIS — Z951 Presence of aortocoronary bypass graft: Secondary | ICD-10-CM | POA: Diagnosis not present

## 2020-08-22 DIAGNOSIS — I2510 Atherosclerotic heart disease of native coronary artery without angina pectoris: Secondary | ICD-10-CM | POA: Diagnosis not present

## 2020-08-22 DIAGNOSIS — T8789 Other complications of amputation stump: Secondary | ICD-10-CM | POA: Diagnosis not present

## 2020-08-22 DIAGNOSIS — Z87891 Personal history of nicotine dependence: Secondary | ICD-10-CM | POA: Diagnosis not present

## 2020-08-22 DIAGNOSIS — Z89431 Acquired absence of right foot: Secondary | ICD-10-CM | POA: Diagnosis not present

## 2020-08-22 DIAGNOSIS — Z48812 Encounter for surgical aftercare following surgery on the circulatory system: Secondary | ICD-10-CM | POA: Diagnosis not present

## 2020-08-22 DIAGNOSIS — I70213 Atherosclerosis of native arteries of extremities with intermittent claudication, bilateral legs: Secondary | ICD-10-CM | POA: Diagnosis not present

## 2020-08-22 DIAGNOSIS — I452 Bifascicular block: Secondary | ICD-10-CM | POA: Diagnosis not present

## 2020-08-22 DIAGNOSIS — E11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: Secondary | ICD-10-CM | POA: Diagnosis not present

## 2020-08-24 ENCOUNTER — Encounter (HOSPITAL_BASED_OUTPATIENT_CLINIC_OR_DEPARTMENT_OTHER): Payer: Medicare Other | Admitting: Physician Assistant

## 2020-08-24 DIAGNOSIS — E113493 Type 2 diabetes mellitus with severe nonproliferative diabetic retinopathy without macular edema, bilateral: Secondary | ICD-10-CM | POA: Diagnosis not present

## 2020-08-24 DIAGNOSIS — I70213 Atherosclerosis of native arteries of extremities with intermittent claudication, bilateral legs: Secondary | ICD-10-CM | POA: Diagnosis not present

## 2020-08-24 DIAGNOSIS — I2510 Atherosclerotic heart disease of native coronary artery without angina pectoris: Secondary | ICD-10-CM | POA: Diagnosis not present

## 2020-08-24 DIAGNOSIS — T8789 Other complications of amputation stump: Secondary | ICD-10-CM | POA: Diagnosis not present

## 2020-08-24 DIAGNOSIS — Z48812 Encounter for surgical aftercare following surgery on the circulatory system: Secondary | ICD-10-CM | POA: Diagnosis not present

## 2020-08-24 DIAGNOSIS — E11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: Secondary | ICD-10-CM | POA: Diagnosis not present

## 2020-08-24 NOTE — Discharge Summary (Signed)
[facility] Discharge Summary   Patient ID:  [name] MRN: [id number] DOB/AGE: [birth year] 78 y.o.  Admit date: 08/16/2020 Discharge date: 08/18/2020 Date of Surgery: 08/16/2020 Surgeon: Surgeon(s): [name], MD  Admission Diagnosis: PAD (peripheral artery disease) [facility]) [I73.9]  Discharge Diagnoses:  PAD (peripheral artery disease) ([place]) [I73.9]  Secondary Diagnoses: Past Medical History:  Diagnosis Date  . 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 Arthritis   . 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 Complication of anesthesia    VERSED- agitated, muscle spasms, "jerking" , frantic , (never had this occurence in the pas)   . Coronary artery disease   . Diabetes mellitus without complication ([place])   . Dysrhythmia    PVC's  . GERD (gastroesophageal reflux disease)   . Heart murmur   . History of hiatal hernia   . Hyperlipidemia   . Hypertension 11/20/11   ECHO- EF>55% Borderline concentric left ventricular hypertrophy. There is a small calcified mass in the L:A near the LA appendage. No valvular masses seen with associated mitral annular calcification. LA Volume/ BSA27.4 ml/m2 No AS. Right ventricular systolic pressure is elevated at 33mmHg.  . Myocardial infarction [facility])    June 2021  . Neuromuscular disorder ([facility])    neuropathy in bilateral feet  . Peripheral vascular disease ([place])   . Pneumonia    not hosp.   . 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  PTCAs with drug coated balloon Lt ant tibial artery and Lt popliteal artery  11/26/2019    Procedure(s): RIGHT FEMORAL ENDARTERECTOMY BYPASS GRAFT FEMORAL-POPLITEAL ARTERY  Discharged Condition: good  HPI: This is a 79 y.o. female has history of bilateral lower extremity endovascular operations.  She also has a right common femoral endarterectomy with bovine pericardial patch angioplasty.  More recently underwent coronary artery bypass grafting with  harvest of her right greater saphenous vein.  She has had some difficulty healing this wound.  She also has a previous TMA on the right she has a recent ulcer that has developed on there which is small with occasional drainage.  She is set to see the wound care center for this.  She does walk.  She has never had bypass surgery in her lower extremities.  She is on aspirin, Plavix and a statin.  Angiogram performed by Dr. Gwenlyn Found 08/16/2020 revealed  Right lower extremity-80% right common femoral artery just above the previous endarterectomy site, occluded right SFA at the origin reconstituting the adductor canal by profunda femoris collaterals.  The profunda femoris was diffusely diseased.  There was an 80% tibioperoneal trunk stenosis.  There is one-vessel runoff via the peroneal artery.    Hospital Course: 08/16/2020 [name] is a 79 y.o. female is S/P  Procedure(s): RIGHT FEMORAL ENDARTERECTOMY BYPASS GRAFT FEMORAL-POPLITEAL ARTERY Post op BLE well perfused and warm. Brisk right peroneal signal right foot, groin soft.    She is doing well with ambulation, tolerating PO's with N/V, pain well controlled.   HGB 7.0 this am will transfuse 1 unit PRBC if she tolerates this well she will be discharged home POD # 2 F/U in 2-3 weeks with Dr. Donzetta Matters  Significant Diagnostic Studies: CBC Lab Results  Component Value Date   WBC 11.5 (H) 08/18/2020   HGB 7.0 (L) 08/18/2020   HCT 22.6 (L) 08/18/2020   MCV 87.3 08/18/2020   PLT 181 08/18/2020    BMET  Component Value Date/Time   NA 136 08/18/2020 0114   NA 142 07/29/2020 0942   K 4.5 08/18/2020 0114   CL 105 08/18/2020 0114   CO2 23 08/18/2020 0114   GLUCOSE 163 (H) 08/18/2020 0114   BUN 29 (H) 08/18/2020 0114   BUN 17 07/29/2020 0942   CREATININE 0.95 08/18/2020 0114   CREATININE 0.86 12/09/2019 1136   CALCIUM 8.7 (L) 08/18/2020 0114   GFRNONAA >60 08/18/2020 0114   GFRAA 75 07/29/2020 0942   COAG Lab Results  Component Value  Date   INR 1.0 08/10/2020   INR 1.8 (H) 03/24/2020   INR 1.1 03/24/2020     Disposition:  Discharge to :Home Discharge Instructions    Call MD for:  redness, tenderness, or signs of infection (pain, swelling, bleeding, redness, odor or green/yellow discharge around incision site)   Complete by: As directed    Call MD for:  severe or increased pain, loss or decreased feeling  in affected limb(s)   Complete by: As directed    Call MD for:  temperature >100.5   Complete by: As directed    Resume previous diet   Complete by: As directed      Allergies as of 08/18/2020      Reactions   Bactrim [sulfamethoxazole-trimethoprim] Other (See Comments)   ^ K+( elevated)    Demerol [meperidine]    Delusional    Scopolamine    Delusional    Versed [midazolam] Anxiety   Frantic, out of my mind, agitated       Medication List    TAKE these medications   acetaminophen 500 MG tablet Commonly known as: TYLENOL Take 1,000 mg by mouth every 6 (six) hours as needed for moderate pain or headache.   aspirin EC 81 MG tablet Take 81 mg by mouth at bedtime.   atorvastatin 80 MG tablet Commonly known as: LIPITOR Take 1 tablet (80 mg total) by mouth daily. What changed: when to take this   calcium carbonate 500 MG chewable tablet Commonly known as: TUMS - dosed in mg elemental calcium Chew 2 tablets by mouth daily as needed for indigestion or heartburn.   canagliflozin 300 MG Tabs tablet Commonly known as: Invokana TAKE 1 TABLET BY MOUTH BEFORE BREAKFAST What changed:   how much to take  how to take this  when to take this  additional instructions   CINNAMON PO Take 2 tablets by mouth in the morning, at noon, and at bedtime.   clopidogrel 75 MG tablet Commonly known as: PLAVIX Take 1 tablet by mouth once daily   [employer] SENIOR PROBIOTIC PO Take 1 capsule by mouth daily with breakfast.   ferrous sulfate 325 (65 FE) MG tablet Take 1 tablet (325 mg total) by mouth daily.    glipiZIDE 5 MG 24 hr tablet Commonly known as: GLUCOTROL XL Take 1 tablet by mouth once daily with breakfast What changed: See the new instructions.   hydrochlorothiazide 12.5 MG tablet Commonly known as: HYDRODIURIL Take 1 tablet (12.5 mg total) by mouth daily.   metoprolol tartrate 25 MG tablet Commonly known as: LOPRESSOR Take 1.5 tablets (37.5 mg total) by mouth 2 (two) times daily.   oxyCODONE-acetaminophen 5-325 MG tablet Commonly known as: PERCOCET/ROXICET Take 1-2 tablets by mouth every 4 (four) hours as needed for moderate pain.   pantoprazole 40 MG tablet Commonly known as: PROTONIX Take 1 tablet (40 mg total) by mouth daily. What changed: when to take this   pyridoxine 100 MG  tablet Commonly known as: B-6 Take 100 mg by mouth every evening.   senna-docusate 8.6-50 MG tablet Commonly known as: Senokot-S Take 1 tablet by mouth daily.   SYSTANE OP Place 1 drop into both eyes 2 (two) times daily as needed (Dry eyes).   telmisartan 80 MG tablet Commonly known as: MICARDIS Take 1 tablet by mouth once daily What changed: when to take this   vitamin B-12 500 MCG tablet Commonly known as: CYANOCOBALAMIN Take 500 mcg by mouth daily.   vitamin C 100 MG tablet Take 100 mg by mouth daily with supper.      Verbal and written Discharge instructions given to the patient. Wound care per Discharge AVS  Follow-up Information    [name], MD Follow up today.   Specialties: Vascular Surgery, Cardiology Why: 4-6 weeks. Office will call the patients with their appointment (sent) Contact information: [place] [address] [address], [facility] Follow up.   Specialty: [place] Why: pre-op referral from vascular office for any [facility] needs- they will follow up with you post discharge Contact information: [place]  [phone]               Signed: Roxy Horseman 08/24/2020, 8:50 AM  - For VQI Registry use --- Instructions: Press F2 to tab through selections.  Delete question if not applicable.   Post-op:  Wound infection: No  Graft infection: No  Transfusion: Yes  If yes, 1 units given New Arrhythmia: No Ipsilateral amputation: [x ] no, [ ]  Minor, [ ]  BKA, [ ]  AKA Discharge patency: [ ]  Primary, [ ]  Primary assisted, [x ] Secondary, [ ]  Occluded Patency judged by: [ x] Dopper only, [ ]  Palpable graft pulse, [ ]  Palpable distal pulse, [ ]  ABI inc. > 0.15, [ ]  Duplex  D/C Ambulatory Status: Ambulatory  Complications: MI: [x ] No, [ ]  Troponin only, [ ]  EKG or Clinical CHF: No Resp failure: [x ] none, [ ]  Pneumonia, [ ]  Ventilator Chg in renal function: [x ] none, [ ]  Inc. Cr > 0.5, [ ]  Temp. Dialysis, [ ]  Permanent dialysis Stroke: [x ] None, [ ]  Minor, [ ]  Major Return to OR: No  Reason for return to OR: [ ]  Bleeding, [ ]  Infection, [ ]  Thrombosis, [ ]  Revision  Discharge medications: Statin use:  Yes ASA use:  Yes Plavix use:  Yes Beta blocker use: Yes Coumadin use: No  for medical reason not indicated

## 2020-08-25 ENCOUNTER — Other Ambulatory Visit: Payer: Self-pay

## 2020-08-25 ENCOUNTER — Telehealth: Payer: Self-pay

## 2020-08-25 ENCOUNTER — Encounter (HOSPITAL_BASED_OUTPATIENT_CLINIC_OR_DEPARTMENT_OTHER): Payer: Medicare Other | Attending: Internal Medicine | Admitting: Internal Medicine

## 2020-08-25 DIAGNOSIS — L97518 Non-pressure chronic ulcer of other part of right foot with other specified severity: Secondary | ICD-10-CM | POA: Insufficient documentation

## 2020-08-25 DIAGNOSIS — I70238 Atherosclerosis of native arteries of right leg with ulceration of other part of lower right leg: Secondary | ICD-10-CM | POA: Diagnosis not present

## 2020-08-25 DIAGNOSIS — L97812 Non-pressure chronic ulcer of other part of right lower leg with fat layer exposed: Secondary | ICD-10-CM | POA: Diagnosis not present

## 2020-08-25 DIAGNOSIS — E11621 Type 2 diabetes mellitus with foot ulcer: Secondary | ICD-10-CM | POA: Diagnosis not present

## 2020-08-25 DIAGNOSIS — I10 Essential (primary) hypertension: Secondary | ICD-10-CM | POA: Insufficient documentation

## 2020-08-25 DIAGNOSIS — E11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: Secondary | ICD-10-CM | POA: Insufficient documentation

## 2020-08-25 DIAGNOSIS — Z89431 Acquired absence of right foot: Secondary | ICD-10-CM | POA: Diagnosis not present

## 2020-08-25 DIAGNOSIS — L97218 Non-pressure chronic ulcer of right calf with other specified severity: Secondary | ICD-10-CM | POA: Insufficient documentation

## 2020-08-25 DIAGNOSIS — L97512 Non-pressure chronic ulcer of other part of right foot with fat layer exposed: Secondary | ICD-10-CM | POA: Diagnosis not present

## 2020-08-25 NOTE — Progress Notes (Addendum)
PLUMA, DINIZ ([id number]) Visit Report for 08/25/2020 Arrival Information Details Patient Name: Date of Service: [name] 08/25/2020 9:00 A M Medical Record Number: [id number] Patient Account Number: [id number] Date of Birth/Sex: Treating RN: 06/29/1941 (79 y.o. [name], [name] Primary Care Donyel Nester: [name] Other Clinician: Referring Katrine Radich: Treating Terald Jump/[name]: Tressie Stalker in Treatment: 3 Visit Information History Since Last Visit Added or deleted any medications: No Patient Arrived: [name] Any new allergies or adverse reactions: No Arrival Time: 09:19 Had a fall or experienced change in No Accompanied By: self activities of daily living that may affect Transfer Assistance: None risk of falls: Patient Identification Verified: Yes Signs or symptoms of abuse/neglect since last visito No Secondary Verification Process Completed: Yes Hospitalized since last visit: No Patient Has Alerts: Yes Implantable device outside of the clinic excluding No Patient Alerts: R ABI: 0.98 cellular tissue based products placed in the center since last visit: Has Dressing in Place as Prescribed: Yes Pain Present Now: No Electronic Signature(s) Signed: 08/25/2020 9:20:20 AM By: Rhae Hammock RN Entered By: Rhae Hammock on 08/25/2020 09:20:20 -------------------------------------------------------------------------------- Encounter Discharge Information Details Patient Name: Date of Service: [name], [place]. 08/25/2020 9:00 A M Medical Record Number: [id number] Patient Account Number: [id number] Date of Birth/Sex: Treating RN: [birth year] (79 y.o. [name] Primary Care Jerard Bays: [name] Other Clinician: Referring Raeanna Soberanes: Treating Durwin Davisson/[name]: Tressie Stalker in Treatment: 3 Encounter Discharge Information Items Post Procedure Vitals Discharge Condition:  Stable Temperature (F): 97.4 Ambulatory Status: Cane Pulse (bpm): 93 Discharge Destination: Home Respiratory Rate (breaths/min): 18 Transportation: Private Auto Blood Pressure (mmHg): 127/85 Accompanied By: alone Schedule Follow-up Appointment: Yes Clinical Summary of Care: Patient Declined Electronic Signature(s) Signed: 08/26/2020 4:47:23 PM By: [name] RN, BSN Entered By: [name] on 08/25/2020 10:09:57 -------------------------------------------------------------------------------- Lower Extremity Assessment Details Patient Name: Date of Service: [name], [place]. 08/25/2020 9:00 A M Medical Record Number: [id number] Patient Account Number: [id number] Date of Birth/Sex: Treating RN: 1941-09-07 (79 y.o. [name], [name] Primary Care Ashlon Lottman: [name] Other Clinician: Referring Hallis Meditz: Treating Sanuel Ladnier/[name]: Tressie Stalker in Treatment: 3 Edema Assessment Assessed: Shirlyn Goltz: No] Patrice Paradise: Yes] Edema: [Left: N] [Right: o] Calf Left: Right: Point of Measurement: 28 cm From Medial Instep 31 cm Ankle Left: Right: Point of Measurement: 9 cm From Medial Instep 20 cm Vascular Assessment Pulses: Dorsalis Pedis Palpable: [Right:Yes] Posterior Tibial Palpable: [Right:Yes] Electronic Signature(s) Signed: 08/25/2020 5:27:06 PM By: Rhae Hammock RN Entered By: Rhae Hammock on 08/25/2020 09:28:09 -------------------------------------------------------------------------------- Multi Wound Chart Details Patient Name: Date of Service: [name], [place]. 08/25/2020 9:00 A M Medical Record Number: [id number] Patient Account Number: [id number] Date of Birth/Sex: Treating RN: 07/14/1941 (79 y.o. [name], [name] Primary Care Sharniece Gibbon: [name] Other Clinician: Referring Aldred Mase: Treating Kaniya Trueheart/[name]: Tressie Stalker in Treatment: 3 Vital Signs Height(in): Capillary Blood  Glucose(mg/dl): 125 Weight(lbs): Pulse(bpm): 76 Body Mass Index(BMI): Blood Pressure(mmHg): 127/85 Temperature(F): 97.4 Respiratory Rate(breaths/min): 18 Photos: [11:No Photos Right, Medial Amputation Site -] [12:No Photos Right, Medial Lower Leg] [N/A:N/A N/A] Wound Location: [11:Transmetatarsal Gradually Appeared] [12:Surgical Injury] [N/A:N/A] Wounding Event: [11:Diabetic Wound/Ulcer of the Lower] [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] [N/A:N/A] 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] [12:N/A] [N/A:N/A] Secondary Etiology: [11:Coronary Artery Disease, Deep Vein Coronary Artery Disease, Deep Vein] [N/A:N/A] Comorbid History: [11:Thrombosis, Hypertension, Myocardial Thrombosis, Hypertension, Myocardial Infarction, Peripheral Arterial Disease, Infarction, Peripheral Arterial Disease, Type II Diabetes 07/23/2020] [12:Type II Diabetes 03/24/2020] [N/A:N/A] Date Acquired: [11:3] [12:3] [  N/A:N/A] Weeks of Treatment: [11:Open] [12:Open] [N/A:N/A] Wound Status: [11:0.2x0.4x0.1] [12:0.6x1x0.1] [N/A:N/A] Measurements L x W x D (cm) [11:0.063] [12:0.471] [N/A:N/A] A (cm) : rea [11:0.006] [12:0.047] [N/A:N/A] Volume (cm) : [11:61.80%] [12:60.00%] [N/A:N/A] % Reduction in A [11:rea: 87.80%] [12:60.20%] [N/A:N/A] % Reduction in Volume: [11:Grade 2] [12:Unclassifiable] [N/A:N/A] Classification: [11:Medium] [12:Medium] [N/A:N/A] Exudate A mount: [11:Serosanguineous] [12:Serosanguineous] [N/A:N/A] Exudate Type: [11:red, brown] [12:red, brown] [N/A:N/A] Exudate Color: [11:Flat and Intact] [12:Flat and Intact] [N/A:N/A] Wound Margin: [11:Medium (34-66%)] [12:None Present (0%)] [N/A:N/A] Granulation A mount: [11:Pink, Pale] [12:N/A] [N/A:N/A] Granulation Quality: [11:Medium (34-66%)] [12:Large (67-100%)] [N/A:N/A] Necrotic A mount: [11:Fat Layer (Subcutaneous Tissue): Yes Fascia: No] [N/A:N/A] Exposed Structures: [11:Fascia: No Tendon: No Muscle: No Joint: No Bone: No None] [12:Fat  Layer (Subcutaneous Tissue): No Tendon: No Muscle: No Joint: No Bone: No None] [N/A:N/A] Epithelialization: [11:Debridement - Excisional] [12:Debridement - Excisional] [N/A:N/A] Debridement: Pre-procedure Verification/Time Out 09:48 [12:09:48] [N/A:N/A] Taken: [11:Lidocaine 4% Topical Solution] [12:Lidocaine 4% Topical Solution] [N/A:N/A] Pain Control: [11:Subcutaneous, Slough] [12:Subcutaneous, Slough] [N/A:N/A] Tissue Debrided: [11:Skin/Subcutaneous Tissue] [12:Skin/Subcutaneous Tissue] [N/A:N/A] Level: [11:0.08] [12:0.6] [N/A:N/A] Debridement A (sq cm): [11:rea Curette] [12:Curette] [N/A:N/A] Instrument: [11:Minimum] [12:Minimum] [N/A:N/A] Bleeding: [11:Pressure] [12:Pressure] [N/A:N/A] Hemostasis A chieved: [11:0] [12:0] [N/A:N/A] Procedural Pain: [11:0] [12:0] [N/A:N/A] Post Procedural Pain: [11:Procedure was tolerated well] [12:Procedure was tolerated well] [N/A:N/A] Debridement Treatment Response: [11:0.2x0.4x0.1] [12:0.6x1x0.1] [N/A:N/A] Post Debridement Measurements L x W x D (cm) [11:0.006] [12:0.047] [N/A:N/A] Post Debridement Volume: (cm) [11:Debridement] [12:Debridement] [N/A:N/A] Treatment Notes Electronic Signature(s) Signed: 08/25/2020 12:52:04 PM By: [name] MD Signed: 08/25/2020 5:54:37 PM By: Deon Pilling Entered By: [name] on 08/25/2020 09:57:41 -------------------------------------------------------------------------------- Multi-Disciplinary Care Plan Details Patient Name: Date of Service: [name], [name] 08/25/2020 9:00 A M Medical Record Number: [id number] Patient Account Number: [id number] Date of Birth/Sex: Treating RN: [birth year] (79 y.o. [name], [name] Primary Care Zori Benbrook: [name] Other Clinician: Referring Chane Cowden: Treating Venida Tsukamoto/[name]: Tressie Stalker in Treatment: 3 Active Inactive Nutrition Nursing Diagnoses: Potential for alteratiion in Nutrition/Potential for imbalanced  nutrition Goals: Patient/caregiver agrees to and verbalizes understanding of need to obtain nutritional consultation Date Initiated: 08/04/2020 Target Resolution Date: 09/02/2020 Goal Status: Active Interventions: Assess HgA1c results as ordered upon admission and as needed Provide education on elevated blood sugars and impact on wound healing Provide education on nutrition Treatment Activities: Obtain HgA1c : 08/04/2020 Patient referred to Primary Care Physician for further nutritional evaluation : 08/04/2020 Notes: Tissue Oxygenation Nursing Diagnoses: Potential alteration in peripheral tissue perfusion (select prior to confirmation of diagnosis) Goals: Invasive arterial studies completed as ordered Date Initiated: 08/04/2020 Target Resolution Date: 08/16/2020 Goal Status: Active Patient/caregiver will verbalize understanding of disease process and disease management Date Initiated: 08/04/2020 Target Resolution Date: 09/01/2020 Goal Status: Active Interventions: Assess patient understanding of disease process and management upon diagnosis and as needed Assess peripheral arterial status upon admission and as needed Provide education on tissue oxygenation and ischemia Treatment Activities: Invasive vascular studies : 08/04/2020 Revascularization procedures : 08/04/2020 Notes: Electronic Signature(s) Signed: 08/25/2020 5:54:37 PM By: Deon Pilling Entered By: Deon Pilling on 08/25/2020 09:27:25 -------------------------------------------------------------------------------- Pain Assessment Details Patient Name: Date of Service: [name], [place]. 08/25/2020 9:00 A M Medical Record Number: [id number] Patient Account Number: [id number] Date of Birth/Sex: Treating RN: 13-Jan-1941 (79 y.o. [name], [name] Primary Care Enoch Moffa: [name] Other Clinician: Referring Wilkin Lippy: Treating Karima Carrell/[name]: Tressie Stalker in Treatment:  3 Active Problems Location of Pain Severity and Description of Pain Patient Has Paino No Site Locations Rate the pain. Rate the  pain. Current Pain Level: 0 Pain Management and Medication Current Pain Management: Electronic Signature(s) Signed: 08/25/2020 9:21:02 AM By: Rhae Hammock RN Entered By: Rhae Hammock on 08/25/2020 09:21:01 -------------------------------------------------------------------------------- Patient/Caregiver Education Details Patient Name: Date of Service: [name], [name] 12/2/2021andnbsp9:00 A M Medical Record Number: [id number] Patient Account Number: [id number] Date of Birth/Gender: Treating RN: Feb 10, 1941 (79 y.o. [name] Primary Care Physician: [name] Other Clinician: Referring Physician: Treating Physician/[name]: Tressie Stalker in Treatment: 3 Education Assessment Education Provided To: Patient Education Topics Provided Elevated Blood Sugar/ Impact on Healing: Handouts: Elevated Blood Sugars: How Do They Affect Wound Healing Methods: Explain/Verbal Responses: Reinforcements needed Electronic Signature(s) Signed: 08/25/2020 5:54:37 PM By: Deon Pilling Entered By: Deon Pilling on 08/25/2020 09:28:14 -------------------------------------------------------------------------------- Wound Assessment Details Patient Name: Date of Service: [name], [name] 08/25/2020 9:00 A M Medical Record Number: [id number] Patient Account Number: [id number] Date of Birth/Sex: Treating RN: August 25, 1941 (79 y.o. 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  Event: Gradually Appeared Etiology: Date Acquired: 07/23/2020 Wound Open Weeks Of Treatment: 3 Status: Clustered Wound: No Comorbid Coronary Artery Disease, Deep Vein Thrombosis, Hypertension, History: Myocardial Infarction, Peripheral Arterial Disease, Type II Diabetes Photos Photo Uploaded By: [name] on 08/26/2020 15:40:50 Wound Measurements Length: (cm) 0.2 Width: (cm) 0.4 Depth: (cm) 0.1 Area: (cm) 0.063 Volume: (cm) 0.006 % Reduction in Area: 61.8% % Reduction in Volume: 87.8% Epithelialization: None Tunneling: No Undermining: No Wound Description Classification: Grade 2 Wound Margin: Flat and Intact Exudate Amount: Medium Exudate Type: Serosanguineous Exudate Color: red, brown Foul Odor After Cleansing: No Slough/Fibrino Yes Wound Bed Granulation Amount: Medium (34-66%) Exposed Structure Granulation Quality: Pink, Pale Fascia Exposed: No Necrotic Amount: Medium (34-66%) Fat Layer (Subcutaneous Tissue) Exposed: Yes Necrotic Quality: Adherent Slough Tendon Exposed: No Muscle Exposed: No Joint Exposed: No Bone Exposed: No Treatment Notes 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 Peri-Wound Care Skin Prep Discharge Instruction: Use skin prep as directed Topical Primary Dressing IODOFLEX 0.9% Cadexomer Iodine Pad 4x6 cm Discharge Instruction: Or iodosorb if iodoflex not available Apply to wound bed Secondary Dressing ComfortFoam Border, 3x3 in (silicone border) Discharge Instruction: Or gauze and kerlix. Apply over primary dressing as directed. Secured With Compression Wrap Compression Stockings Environmental education officer) Signed: 08/25/2020 5:27:06 PM By: Rhae Hammock RN Entered By: Rhae Hammock on 08/25/2020  09:32:23 -------------------------------------------------------------------------------- Wound Assessment Details Patient Name: Date of Service: [name], [name] 08/25/2020 9:00 A M Medical Record Number: [id number] Patient Account Number: [id number] Date of Birth/Sex: Treating RN: 04-Jul-1941 (79 y.o. 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, Medial Lower Leg Wound Open Wounding Event: Surgical Injury Status: Date Acquired: 03/24/2020 Comorbid Coronary Artery Disease, Deep Vein Thrombosis, Hypertension, Weeks Of Treatment: 3 History: Myocardial Infarction, Peripheral Arterial Disease, Type II Diabetes Clustered Wound: No Photos Photo Uploaded By: [name] on 08/26/2020 15:40:51 Wound Measurements Length: (cm) 0.6 Width: (cm) 1 Depth: (cm) 0.1 Area: (cm) 0.471 Volume: (cm) 0.047 %  Reduction in Area: 60% % Reduction in Volume: 60.2% Epithelialization: None Tunneling: No Undermining: No Wound Description Classification: Unclassifiable Wound Margin: Flat and Intact Exudate Amount: Medium Exudate Type: Serosanguineous Exudate Color: red, brown Foul Odor After Cleansing: No Slough/Fibrino Yes Wound Bed Granulation Amount: None Present (0%) Exposed Structure Necrotic Amount: Large (67-100%) Fascia Exposed: No Necrotic Quality: Adherent Slough Fat Layer (Subcutaneous Tissue) Exposed: No Tendon Exposed: No Muscle Exposed: No Joint Exposed: No Bone Exposed: No Treatment Notes Wound #12 (Lower Leg) Wound Laterality: Right, Medial Cleanser Soap and Water Discharge Instruction: May shower and wash wound with dial antibacterial soap and water prior to dressing change. Peri-Wound Care Skin Prep Discharge Instruction: Use  skin prep as directed Topical Primary Dressing IODOFLEX 0.9% Cadexomer Iodine Pad 4x6 cm Discharge Instruction: Or iodosorb if iodoflex not available Apply to wound bed Secondary Dressing ComfortFoam Border, 3x3 in (silicone border) Discharge Instruction: Or gauze and kerlix. Apply over primary dressing as directed. Secured With Compression Wrap Compression Stockings Environmental education officer) Signed: 08/25/2020 5:27:06 PM By: Rhae Hammock RN Entered By: Rhae Hammock on 08/25/2020 09:35:15 -------------------------------------------------------------------------------- Vitals Details Patient Name: Date of Service: [name], [place]. 08/25/2020 9:00 A M Medical Record Number: [id number] Patient Account Number: [id number] Date of Birth/Sex: Treating RN: 09-21-1941 (79 y.o. [name], [name] Primary Care Sheriece Jefcoat: [name] Other Clinician: Referring Malachi Kinzler: Treating Jalani Cullifer/[name]: Tressie Stalker in Treatment: 3 Vital Signs Time Taken: 09:20 Temperature (F): 97.4 Pulse (bpm): 93 Respiratory Rate (breaths/min): 18 Blood Pressure (mmHg): 127/85 Capillary Blood Glucose (mg/dl): 125 Reference Range: 80 - 120 mg / dl Electronic Signature(s) Signed: 08/25/2020 9:20:50 AM By: Rhae Hammock RN Entered By: Rhae Hammock on 08/25/2020 09:20:49

## 2020-08-25 NOTE — Progress Notes (Signed)
[name], [name] ([id number]) Visit Report for 08/25/2020 Debridement Details Patient Name: Date of Service: [name] 08/25/2020 9:00 A M Medical Record Number: [id number] Patient Account Number: [id number] Date of Birth/Sex: Treating RN: [birth year] (79 y.o. [name], [name] Primary Care Provider: [name] Other Clinician: Referring Provider: Treating Provider/[name]: Tressie Stalker in Treatment: 3 Debridement Performed for Assessment: Wound #11 Right,Medial Amputation Site - Transmetatarsal Performed By: Physician Ricard Dillon., MD Debridement Type: Debridement Severity of Tissue Pre Debridement: Fat layer exposed Level of Consciousness (Pre-procedure): Awake and Alert Pre-procedure Verification/Time Out Yes - 09:48 Taken: Start Time: 09:49 Pain Control: Lidocaine 4% T opical Solution T Area Debrided (L x W): otal 0.2 (cm) x 0.4 (cm) = 0.08 (cm) Tissue and other material debrided: Viable, Non-Viable, Slough, Subcutaneous, Skin: Dermis , Fibrin/Exudate, Slough Level: Skin/Subcutaneous Tissue Debridement Description: Excisional Instrument: Curette Bleeding: Minimum Hemostasis Achieved: Pressure End Time: 09:51 Procedural Pain: 0 Post Procedural Pain: 0 Response to Treatment: Procedure was tolerated well Level of Consciousness (Post- Awake and Alert procedure): Post Debridement Measurements of Total Wound Length: (cm) 0.2 Width: (cm) 0.4 Depth: (cm) 0.1 Volume: (cm) 0.006 Character of Wound/Ulcer Post Debridement: Improved Severity of Tissue Post Debridement: Fat layer exposed Post Procedure Diagnosis Same as Pre-procedure Electronic Signature(s) Signed: 08/25/2020 12:52:04 PM By: [name] MD Signed: 08/25/2020 5:54:37 PM By: Deon Pilling Entered By: Deon Pilling on 08/25/2020 09:51:25 -------------------------------------------------------------------------------- Debridement Details Patient Name: Date of  Service: [name], [name] LLY H. 08/25/2020 9:00 A M Medical Record Number: [id number] Patient Account Number: [id number] Date of Birth/Sex: Treating RN: 03/11/1941 (79 y.o. [name], [name] Primary Care Provider: [name] Other Clinician: Referring Provider: Treating Provider/[name]: Tressie Stalker in Treatment: 3 Debridement Performed for Assessment: Wound #12 Right,Medial Lower Leg Performed By: Physician Ricard Dillon., MD Debridement Type: Debridement Severity of Tissue Pre Debridement: Fat layer exposed Level of Consciousness (Pre-procedure): Awake and Alert Pre-procedure Verification/Time Out Yes - 09:48 Taken: Start Time: 09:49 Pain Control: Lidocaine 4% T opical Solution T Area Debrided (L x W): otal 0.6 (cm) x 1 (cm) = 0.6 (cm) Tissue and other material debrided: Viable, Non-Viable, Slough, Subcutaneous, Skin: Dermis , Fibrin/Exudate, Slough Level: Skin/Subcutaneous Tissue Debridement Description: Excisional Instrument: Curette Bleeding: Minimum Hemostasis Achieved: Pressure End Time: 09:51 Procedural Pain: 0 Post Procedural Pain: 0 Response to Treatment: Procedure was tolerated well Level of Consciousness (Post- Awake and Alert procedure): Post Debridement Measurements of Total Wound Length: (cm) 0.6 Width: (cm) 1 Depth: (cm) 0.1 Volume: (cm) 0.047 Character of Wound/Ulcer Post Debridement: Improved Severity of Tissue Post Debridement: Fat layer exposed Post Procedure Diagnosis Same as Pre-procedure Electronic Signature(s) Signed: 08/25/2020 12:52:04 PM By: [name] MD Signed: 08/25/2020 5:54:37 PM By: Deon Pilling Entered By: Deon Pilling on 08/25/2020 09:51:52 -------------------------------------------------------------------------------- HPI Details Patient Name: Date of Service: [name], [name] LLY H. 08/25/2020 9:00 A M Medical Record Number: [id number] Patient Account Number: [id number] Date of Birth/Sex:  Treating RN: 01-16-1941 (79 y.o. [name] Primary Care Provider: [name] Other Clinician: Referring Provider: Treating Provider/[name]: Tressie Stalker in Treatment: 3 History of Present Illness HPI Description: ADMISSION 07/13/2019 Patient is a 79 year old type II diabetic. She has known PAD. She has been followed by Dr. Jacqualyn Posey of podiatry for blistering areas on her toes dating back to 04/30/2019 which at this was the left fourth toe. By 8/11 she had wounds on the right and left second toes. She underwent arterial studies by Dr.  Alvester Chou on 7/31 that showed ABIs on the right of 0.60 TBI of 0.26 on the left ABI of 0.56 and a TBI of 0.25. By 9/10 she had ischemic-looking wounds per Dr. Earleen Newport on the right first, left first second and third. She has been using Medihoney and then mupirocin more recently simply Betadine. The patient underwent angiography by Dr. Gwenlyn Found on 9/21. 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 Noted that there was 1 vessel runoff in the foot via the peroneal. On the left there was an 80% left SFA, 70% mid left SFA. There was a short segment calcified CTO distal less than SFA and above-knee popliteal artery reconstituting with two-vessel runoff. 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 It was felt that she had bilateral SFA disease as well as tibial vessel disease. An attempt was made to revascularize the left SFA but they were unable to cross because of the highly calcified nature of the lesion. 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 She has an area on the medial left fourth toe with raised horned callus on top of this. I am not certain what this represents. With regards to pain she  has about a 2-1/2 I will claudication tolerance in the grocery store. She has some pain in night which is relieved by putting her feet down over the side of the bed. She is wearing Nitro-Dur patches on the top of her right foot. Past medical history includes type 2 diabetes with secondary PAD, neoplasm of the skin, diabetic retinopathy, carotid artery stenosis, hyperlipidemia hypertension. 11/2; the last time the patient was here I spoke to Dr. Gwenlyn Found about revascularization options on the right. As I understand things currently Dr. Gwenlyn Found spoke with Dr. Trula Slade and ultimately the patient was taken to surgery on 10/27. She had a right iliofemoral endarterectomy with a bovine patch angioplasty. I think the plan now is for her to have a staged intervention on the right SFA by Dr. Alvester Chou. Per the patient's understanding Dr. Gwenlyn Found and Dr. Jacqualyn Posey are waiting to see when the gangrenous toes on the right foot can be amputated. The patient states her pain is a lot better and she is grateful for that. 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 Small area on the left fourth toe. She is using Betadine to the mummified areas on the right and Medihoney on the left 08/27/2019; the last time I saw this patient I discharged her from the clinic. She had been revascularized by Dr. Trula Slade and she had a right iliofemoral endarterectomy with a bovine angioplasty. 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 Jacqualyn Posey of podiatry on 08/07/2019. I note that she also had a intervention by Dr. Gwenlyn Found and he performed a directional atherectomy and drug-coated balloon angioplasty of the SFA and popliteal artery on the right. I am not certain of the date of Dr. Kennon Holter procedure as of the time of this dictation. She was referred back to [place] by Dr. Earleen Newport predominantly for follow-up of the left fourth toe. She still has sutures and stitches in the right  TMA site. She states her pain is a lot better. She expresses concern about the condition of the  amputation site at the TMA. She is on doxycycline I think prescribed by Dr. Earleen Newport. She is complaining of some pain at night 12/10; I spoke to Dr. Jacqualyn Posey last week. He removed the sutures on the right foot on Monday of this week. She has the area on the left fourth toe just proximal to the PIP and then the right TMA site. She is still on doxycycline and has enough through next week. Unfortunately the TMA site does not look good at all. Both on the lateral and medial part of the incisions are areas that probe to bone. There is purulence over the medial part which I have cultured. We will use silver alginate. Left fourth toe looks somewhat better but there was still exposed bone 12/17; patient has an MRI booked for 12/30. Culture I did last week showed Pseudomonas Serratia and Enterococcus. This was purulent drainage coming out of the medial part of her amputation site. I use cefdinir 300 twice daily for 10 days that started on 12/15. Her x-ray on the right showed limited evaluation for osteomyelitis. The findings could have been postoperative. There was subtle erosion in the distal first and distal fifth metatarsal. An MRI was suggested. On the left she had irregularity of the fourth middle and proximal phalanx consistent with a history of osteomyelitis. I do not know that she has a history of osteomyelitis in this area. She had a newly defined area on the plantar third toe 12/31; patient's MRI is listed below: MRI OF THE RIGHT FOREFOOT WITHOUT AND WITH CONTRAST TECHNIQUE: Multiplanar, multisequence MR imaging of the right forefoot was performed before and after the administration of intravenous contrast. CONTRAST 6 mL GADAVIST IV SOLN : COMPARISON: Plain films right foot 09/04/2019. FINDINGS: Bones/Joint/Cartilage The patient is status post transmetatarsal amputation as seen on the prior exam.  Marrow edema and enhancement are seen in all of the distal metatarsals. In the first metatarsal, signal change extends 1.5 cm proximal to the stump and in the second metatarsal extends approximately 2 cm proximal to the stump. Edema and enhancement are seen in only the distal 0.7 cm of the third metatarsal stump and tips of the fourth and fifth metatarsals. Bone marrow signal is otherwise unremarkable. A small focus of subchondral edema is seen in the lateral talus, likely degenerative. Ligaments Intact. Muscles and Tendons No intramuscular fluid collection. Soft tissues Skin ulceration is seen off the stump of the first metatarsal. A thin fluid collection tracks deep to the wound and over the anterior metatarsals worrisome for small abscess. Intense subcutaneous edema and enhancement are seen diffusely. 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 Cellulitis about the foot. Skin ulceration over the distal first metatarsal is identified with a thin fluid collection tracking anteriorly along the stump worrisome for abscess. Small focus of subchondral edema in the lateral talus is likely degenerative. Electronically Signed By: Inge Rise M.D. On: 09/23/2019 15:25 Patient arrives in clinic today not complaining any of any pain. 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 She does not describe claudication however her activity is very limited. 10/01/2019; since the patient was last here I have communicated with Dr. Gwenlyn Found after bypass by Dr. Trula Slade and addressing the right superficial femoral artery he states that she is widely patent through peroneal arteries to the ankle with collaterals to the dorsalis pedis.  He states he is going to talk to colleagues about the  feasibility of tibial pedal access. The patient seems infectious disease later this afternoon Dr. Baxter Flattery. in preparation for this she has been off antibiotics for 1 week and I went ahead and obtain pieces of the remanence of her first metatarsal for pathology and CandS. The patient is a candidate for hyperbaric oxygen. 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 1/15; considerable improvement in the wounds on her feet. We are using silver collagen. She follows with Dr. Baxter Flattery of infectious disease and is on meropenem and daptomycin. She has been taught how to do this herself at home. Follows with Dr. Graylon Good at the end of treatment here. She has 2 wounds on the surgical TMA site 1 lateral and 1 medial the lateral 1 has regressed tremendously. The area medially still has some exposed bone although the base of this looks healthy. 1/22; 2 separate wounds on the surgical TMA site. Both of these look satisfactory. The medial area does not have any exposed bone. This is an improvement On the left side fourth toe dorsally over the proximal phalanx there is a deep punched out area that probes to bone. She has an area on the tip of the left third toe. She also tells [place] that in HBO she traumatized her left Achilles and this is left her with a superficial wound area 1/28; weekly visit along with HBO. She has 5 wounds. T our punched out areas on the original TMA site on the right. Both of these appear to have contracted. o The area on the right no longer has exposed bone. She has an area on the tip of the left third toe and on the DIP of the left fourth toe. Both of these had surface callus that I removed and unfortunately they have small areas that both go to bone. She has a traumatic wound on the left Achilles area that happened in HBO and that appears better. She is tolerating her IV antibiotics well at home. She has home health changing the dressings and she is doing  it once on the weekends. We have been using silver collagen. She has been extensively revascularized on the right by Dr. Trula Slade and subsequently by Dr. Gwenlyn Found. 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 Baxter Flattery of infectious disease on 2/17. Dr. Gwenlyn Found on 2/23 and according to her her antibiotics stopped on 2/23. She is tolerating hyperbarics well. She has made a tremendous improvement in the right forefoot with only 2 smaller open wounds. Using silver collagen. On the left foot she has the area on the tip of the left third toe and the medial part of the left fourth toe. These had exposed bone last week I did not sense any of that today 2/12; sees Dr. Graylon Good on 2/17 and Dr. Gwenlyn Found on 2/23. We are going to use Dermagraft on this today however the lateral part of her train TMA incision on the right is healed and the medial part is down so much that we just continue with the silver collagen She has wounds on the tip of her left third and the medial aspect of the left fourth. Both of these still have exposed bone I have not been able to get these to epithelialize. 2/19; she sees Dr. Gwenlyn Found  on 2/23 and I have communicated with him about the left vascular supply. Looking at her angiogram from 08/03/2019 it looks as though that they could not cross the left CFA. Noted that she had one-vessel runoff bilaterally. She also apparently saw Dr. Graylon Good although I did not look up these results for preparation of this record. 2/26; Dr. Gwenlyn Found is going to do an angiogram next week on Wednesday. I think they are going to try to go at this both retrograde and anterograde to see if anything can be done to revascularize the left lower limb. She continues to make nice progress on the remaining wound on the right medial foot on her TMA site and the toe it wounds on the left are responding nicely as  well 3/12; the patient underwent an extensive and complicated revascularization on the left leg by Dr. Fletcher Anon on 3/3; she had an atherectomy on occluded left popliteal artery; anterior tibial artery followed by a balloon angioplasty. Atherectomy was also performed to the left SFA because there was still significant 50% stenosis in the left popliteal artery they performed an intravascular lithotripsy which improved the residual stenosis to 20%. The same lithotripsy was used to dilate the proximal left SFA. She had a drug-eluting stent placed I believe in the SFA. The patient returned for hyperbarics this week. She has had some eye problems on the right which she tells me are secondary to diabetic retinopathy and she saw her eye doctor. She is really made excellent progress there is no open wounds on the left foot at all. She has one open area medially and her TMA site on the right lateral wound is closed. 3/19; the patient comes in today with the area on the medial transmet on the right improved. Some debris removed from the surface revealed the still open wound. Unfortunately she now has the open area on the third toe tip and the medial aspect of the dorsal fourth toe. These are in the same location as her previous wounds. These had actually closed up last week. 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 She is undergone complex revascularizations on the right by Dr. Trula Slade and Dr. Gwenlyn Found and I believe Dr. 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 She completed IVantibiotics as directed by infectious disease and I believe a course of oral antibiotics as well.she underwent a complete 40 treatments of hyperbarics.  She comes in today with the vast majority of the right TMA site healed there is a small opening on the most medial aspect. Her left third and left fourth toes appear to have epithelialization although this has fluctuated somewhat. 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 She had a gaping wound on the right transmetatarsal amputation L as well as deep wounds on the left third and left fourth toes. She underwent complex revascularizations on her bilateral lower legs which I described on her last visit. She completed IV antibiotics by infectious disease and 40 treatments of hyperbarics. Her left third and fourth toes are healed. The area on the right transmetatarsal amputation site is also closed today. READMISSION 08/04/2020 [name] is now a 79 year old woman that we had for a complex stay in our clinic from October 2000  14 January 2020. 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 Her current problems with wounds began on October 30 when she developed a spontaneous opening over the right transmet medial aspect over the first metatarsal head. She has been using collagen on this that she had leftover from her last stay in this clinic. She also has had an open area on the medial right calf from a vein harvest site from her CABG she. She said that this is never really closed since the vein harvest was done. With regards to her arterial status this is a major issue. She had an angiogram by Dr. Gwenlyn Found in September 2020. She developed a gangrenous right first and second toes. Ultimately she was referred to Dr. Trula Slade who did a right common femoral endarterectomy with patch angioplasty. This was on 07/21/2019 and she really had a good result. Dr. Gwenlyn Found did a atherectomy followed by a drug coated balloon angioplasty of her right SFA popliteal and tibial  peroneal trunk on 08/03/2019 again with excellent excellent results. Follow-up.Dopplers in November revealed revealed a widely patent SFA and tibioperoneal trunk. UNFORTUNATELY her recent angiogram that was done on 08/01/2020 showed an 80% right common femoral artery stenosis just above the previous endarterectomy site occluded right right SFA at the origin reconstituting the adductor canal by the profunda femoris collaterals. The profunda femoris is also diffusely diseased. There is and again another 80% tibioperoneal trunk stenosis and one-vessel runoff via the peroneal artery. She is seen Dr. Donzetta Matters and she is scheduled for a femoral endarterectomy and right femoral-popliteal bypass grafting with endarterectomy of her tibial peroneal trunk. The scheduled surgery is on 11/23 The patient does not complain of pain at rest. She does have 5-minute walking claudication. The wounds on her right first met head remanent was spontaneous she does not think she hit this. As mentioned in the area on her right medial calf is a vein harvest site Dr. Gwenlyn Found had texted me about getting her in the clinic and we have arranged this as soon as we can. I have told her that we will have to see how successful Dr. Donzetta Matters is before we know what can be done about the wounds in a precise fashion. Fortunately there does not seem to be any obvious infection involving the right foot although that may need to be looked at if things really deteriorate. She was treated with IV antibiotics and hyperbaric oxygen for her previous osteomyelitis 12/2; the patient underwent a right femoral endarterectomy and bypass femoral to popliteal artery. This was done on 08/16/2020. She is done remarkably well. She has a small area on the right anterior tibia and a small area in the medial part of her original right transmetatarsal amputation. She appears to have had an excellent response to surgery. We have been using Electronic Signature(s) Signed:  08/25/2020 12:52:04 PM By: [name] MD Entered By: [name] on 08/25/2020 10:01:35 -------------------------------------------------------------------------------- Physical Exam Details Patient Name: Date of Service: [name], [name] LLY H. 08/25/2020 9:00 A M Medical Record Number: [id number] Patient Account Number: [id number] Date of Birth/Sex: Treating RN: [birth year] (79 y.o. [name] Primary Care Provider: [name] Other Clinician: Referring Provider: Treating Provider/[name]: Tressie Stalker in Treatment: 3 Constitutional Sitting or standing Blood Pressure is within target range for patient.. Pulse regular and within target  range for patient.Marland Kitchen Respirations regular, non-labored and within target range.. Temperature is normal and within the target range for the patient.Marland Kitchen Appears in no distress. Cardiovascular Both dorsalis pedis and posterior tibial pulses are palpable although reduced. Anything here is truly remarkable. Notes Wound exam Over the right first metatarsal a small linear area also the right mid tibia area. Both of these had surface slough that I removed with a #3 curette and there is viable bleeding tissue underneath. Hemostasis with direct pressure Electronic Signature(s) Signed: 08/25/2020 12:52:04 PM By: [name] MD Entered By: [name] on 08/25/2020 10:02:38 -------------------------------------------------------------------------------- Physician Orders Details Patient Name: Date of Service: [name], [place]. 08/25/2020 9:00 A M Medical Record Number: [id number] Patient Account Number: [id number] Date of Birth/Sex: Treating RN: 1941/06/07 (79 y.o. [name], [name] Primary Care Provider: [name] Other Clinician: Referring Provider: Treating Provider/[name]: Tressie Stalker in Treatment: 3 Verbal / Phone Orders: No Diagnosis Coding ICD-10 Coding Code  Description E11.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 E11.621 Type 2 diabetes mellitus with foot ulcer L97.518 Non-pressure chronic ulcer of other part of right foot with other specified severity L97.218 Non-pressure chronic ulcer of right calf with other specified severity Follow-up Appointments Return Appointment in 2 weeks. Bathing/ Shower/ Hygiene May shower and wash wound with soap and water. - with dressing changes. Edema Control - Lymphedema / SCD / Other Elevate legs to the level of the heart or above for 30 minutes daily and/or when sitting, a frequency of: - 3-4 times throughout the day. Avoid standing for long periods of time. Off-Loading Wound #11 Right,Medial Amputation Site - Transmetatarsal Open toe surgical [name] to: - right foot. Home Health New wound care orders this week; continue Home Health for wound care. May utilize formulary equivalent dressing for wound treatment orders unless otherwise specified. - [facility] to change twice a week. Wound Treatment Wound #11 - Amputation Site - Transmetatarsal Wound Laterality: Right, Medial Cleanser: Soap and Water (Home Health) 3 x Per Day/15 Days Discharge Instructions: May shower and wash wound with dial antibacterial soap and water prior to dressing change. Peri-Wound Care: Skin Prep (Home Health) 3 x Per Day/15 Days Discharge Instructions: Use skin prep as directed Prim Dressing: IODOFLEX 0.9% Cadexomer Iodine Pad 4x6 cm (Home Health) 3 x Per Day/15 Days ary Discharge Instructions: Or iodosorb if iodoflex not available Apply to wound bed Secondary Dressing: ComfortFoam Border, 3x3 in (silicone border) (Home Health) 3 x Per Day/15 Days Discharge Instructions: Or gauze and kerlix. Apply over primary dressing as directed. Wound #12 - Lower Leg Wound Laterality: Right, Medial Cleanser: Soap and Water (Home Health) 3 x Per Day/15 Days Discharge Instructions: May shower and  wash wound with dial antibacterial soap and water prior to dressing change. Peri-Wound Care: Skin Prep (Home Health) 3 x Per Day/15 Days Discharge Instructions: Use skin prep as directed Prim Dressing: IODOFLEX 0.9% Cadexomer Iodine Pad 4x6 cm (Home Health) 3 x Per Day/15 Days ary Discharge Instructions: Or iodosorb if iodoflex not available Apply to wound bed Secondary Dressing: ComfortFoam Border, 3x3 in (silicone border) (Home Health) 3 x Per Day/15 Days Discharge Instructions: Or gauze and kerlix. Apply over primary dressing as directed. Electronic Signature(s) Signed: 08/25/2020 12:52:04 PM By: [name] MD Signed: 08/25/2020 5:54:37 PM By: Deon Pilling Entered By: Deon Pilling on 08/25/2020 09:56:25 -------------------------------------------------------------------------------- Problem List Details Patient Name: Date of Service: [name], [name]. 08/25/2020 9:00 A M Medical Record Number:  [id number] Patient Account Number: [id number] Date of Birth/Sex: Treating RN: 03-22-1941 (79 y.o. 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 08/25/2020 9:00 A M Medical Record Number: [id number] Patient Account Number: [id number] Date of Birth/Sex: Treating RN: [birth year] (79 y.o. [name] Primary Care Provider: [name] Other Clinician: Referring Provider: Treating Provider/[name]: Tressie Stalker in Treatment: 3 Subjective History of Present Illness (HPI) ADMISSION 07/13/2019 Patient is a 79 year old type II diabetic. She has known PAD. She has been followed by Dr. Jacqualyn Posey of podiatry for blistering areas on her toes dating back to 04/30/2019 which at this was the left fourth toe. By 8/11 she had wounds on the right and left second toes. She underwent arterial studies by Dr. Alvester Chou on 7/31 that showed ABIs on the right of 0.60 TBI of 0.26 on the left ABI of 0.56 and a TBI of 0.25. By 9/10 she had ischemic-looking wounds per Dr. Earleen Newport on the right first, left first second and third. She has been using Medihoney and then mupirocin more recently simply Betadine. The patient underwent angiography by Dr. Gwenlyn Found on 9/21. 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 Noted that there was 1 vessel runoff in the foot via the peroneal. ooOn the left there was an 80% left SFA, 70% mid left SFA. There was a short segment calcified CTO distal less than SFA and above-knee popliteal artery reconstituting with two-vessel runoff. 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 It was felt that she had bilateral SFA disease as well as tibial vessel disease. An attempt was made to revascularize the left SFA but they were unable to cross because of the highly calcified nature of the lesion. 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 She has an area on the medial left fourth toe with raised horned callus on top of this. I am not certain what this represents. With regards to pain she has about a 2-1/2 I will claudication tolerance in the grocery store. She has some pain in night which is relieved by putting her feet down over the side of the bed. She is wearing Nitro-Dur patches on the top of her right foot. Past medical history includes type 2 diabetes with secondary PAD, neoplasm of the skin, diabetic retinopathy, carotid artery stenosis, hyperlipidemia hypertension. 11/2; the last time the patient was here I spoke to Dr. Gwenlyn Found about revascularization options on the right. As I understand things currently Dr. Gwenlyn Found spoke with Dr. Trula Slade and ultimately the patient was taken to surgery on 10/27. She had a right iliofemoral endarterectomy with a bovine  patch angioplasty. I think the plan now is for her to have a staged intervention on the right SFA by Dr. Alvester Chou. Per the patient's understanding Dr. Gwenlyn Found and Dr. Jacqualyn Posey are waiting to see when the gangrenous toes on the right foot can be amputated. The patient states her pain is a lot better and she is grateful for that. 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 Small area on the left fourth toe. She is using Betadine to the mummified areas on the right and Medihoney on the left 08/27/2019; the last time I saw this patient I discharged her from the clinic. She had been revascularized by Dr. Trula Slade and she had a right iliofemoral endarterectomy with a bovine angioplasty. 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 Jacqualyn Posey of podiatry on 08/07/2019. I note that she also had a intervention by Dr. Gwenlyn Found and he performed a directional atherectomy and drug-coated balloon angioplasty of the SFA and popliteal artery on the right. I am not certain of the date of Dr. Kennon Holter procedure as of the  time of this dictation. She was referred back to [place] by Dr. Earleen Newport predominantly for follow-up of the left fourth toe. She still has sutures and stitches in the right TMA site. She states her pain is a lot better. She expresses concern about the condition of the amputation site at the TMA. She is on doxycycline I think prescribed by Dr. Earleen Newport. She is complaining of some pain at night 12/10; I spoke to Dr. Jacqualyn Posey last week. He removed the sutures on the right foot on Monday of this week. She has the area on the left fourth toe just proximal to the PIP and then the right TMA site. She is still on doxycycline and has enough through next week. Unfortunately the TMA site does not look good at all. Both on the lateral and medial part of the incisions are areas that probe to bone. There is purulence over the medial part which I have cultured. We will use silver alginate. Left fourth toe looks somewhat better but there was still exposed bone 12/17; patient has an MRI booked for 12/30. Culture I did last week showed Pseudomonas Serratia and Enterococcus. This was purulent drainage coming out of the medial part of her amputation site. I use cefdinir 300 twice daily for 10 days that started on 12/15. Her x-ray on the right showed limited evaluation for osteomyelitis. The findings could have been postoperative. There was subtle erosion in the distal first and distal fifth metatarsal. An MRI was suggested. On the left she had irregularity of the fourth middle and proximal phalanx consistent with a history of osteomyelitis. I do not know that she has a history of osteomyelitis in this area. She had a newly defined area on the plantar third toe 12/31; patient's MRI is listed below: MRI OF THE RIGHT FOREFOOT WITHOUT AND WITH CONTRAST TECHNIQUE: Multiplanar, multisequence MR imaging of the right forefoot was performed before and after the administration of intravenous contrast. CONTRAST 6 mL GADAVIST IV  SOLN : COMPARISON: Plain films right foot 09/04/2019. FINDINGS: Bones/Joint/Cartilage The patient is status post transmetatarsal amputation as seen on the prior exam. Marrow edema and enhancement are seen in all of the distal metatarsals. In the first metatarsal, signal change extends 1.5 cm proximal to the stump and in the second metatarsal extends approximately 2 cm proximal to the stump. Edema and enhancement are seen in  only the distal 0.7 cm of the third metatarsal stump and tips of the fourth and fifth metatarsals. Bone marrow signal is otherwise unremarkable. A small focus of subchondral edema is seen in the lateral talus, likely degenerative. Ligaments Intact. Muscles and Tendons No intramuscular fluid collection. Soft tissues Skin ulceration is seen off the stump of the first metatarsal. A thin fluid collection tracks deep to the wound and over the anterior metatarsals worrisome for small abscess. Intense subcutaneous edema and enhancement are seen diffusely. 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 Cellulitis about the foot. Skin ulceration over the distal first metatarsal is identified with a thin fluid collection tracking anteriorly along the stump worrisome for abscess. Small focus of subchondral edema in the lateral talus is likely degenerative. Electronically Signed By: Inge Rise M.D. On: 09/23/2019 15:25 Patient arrives in clinic today not complaining any of any pain. 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 She does not describe claudication however her activity is very limited. 10/01/2019; since the patient was last here I have communicated with Dr. Gwenlyn Found after bypass by Dr. Trula Slade and addressing the right superficial femoral  artery he states that she is widely patent through peroneal arteries to the ankle with collaterals to the dorsalis pedis. He states he is going to talk to colleagues about the feasibility of tibial pedal access. The patient seems infectious disease later this afternoon Dr. Baxter Flattery. in preparation for this she has been off antibiotics for 1 week and I went ahead and obtain pieces of the remanence of her first metatarsal for pathology and CandS. The patient is a candidate for hyperbaric oxygen. 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 1/15; considerable improvement in the wounds on her feet. We are using silver collagen. She follows with Dr. Baxter Flattery of infectious disease and is on meropenem and daptomycin. She has been taught how to do this herself at home. Follows with Dr. Graylon Good at the end of treatment here. She has 2 wounds on the surgical TMA site 1 lateral and 1 medial the lateral 1 has regressed tremendously. The area medially still has some exposed bone although the base of this looks healthy. 1/22; 2 separate wounds on the surgical TMA site. Both of these look satisfactory. The medial area does not have any exposed bone. This is an improvement On the left side fourth toe dorsally over the proximal phalanx there is a deep punched out area that probes to bone. She has an area on the tip of the left third toe. She also tells [place] that in HBO she traumatized her left Achilles and this is left her with a superficial wound area 1/28; weekly visit along with HBO. She has 5 wounds. T our punched out areas on the original TMA site on the right. Both of these appear to have contracted. o The area on the right no longer has exposed bone. She has an area on the tip of the left third toe and on the DIP of the left fourth toe. Both of these had surface callus that I removed and unfortunately they have small areas that both go to bone. She has a traumatic wound on the  left Achilles area that happened in HBO and that appears better. She is tolerating her IV antibiotics well at home. She  has home health changing the dressings and she is doing it once on the weekends. We have been using silver collagen. She has been extensively revascularized on the right by Dr. Trula Slade and subsequently by Dr. Gwenlyn Found. 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 Baxter Flattery of infectious disease on 2/17. Dr. Gwenlyn Found on 2/23 and according to her her antibiotics stopped on 2/23. She is tolerating hyperbarics well. She has made a tremendous improvement in the right forefoot with only 2 smaller open wounds. Using silver collagen. On the left foot she has the area on the tip of the left third toe and the medial part of the left fourth toe. These had exposed bone last week I did not sense any of that today 2/12; sees Dr. Graylon Good on 2/17 and Dr. Gwenlyn Found on 2/23. We are going to use Dermagraft on this today however the lateral part of her train TMA incision on the right is healed and the medial part is down so much that we just continue with the silver collagen She has wounds on the tip of her left third and the medial aspect of the left fourth. Both of these still have exposed bone I have not been able to get these to epithelialize. 2/19; she sees Dr. Gwenlyn Found on 2/23 and I have communicated with him about the left vascular supply. Looking at her angiogram from 08/03/2019 it looks as though that they could not cross the left CFA. Noted that she had one-vessel runoff bilaterally. She also apparently saw Dr. Graylon Good although I did not look up these results for preparation of this record. 2/26; Dr. Gwenlyn Found is going to do an angiogram next week on Wednesday. I think they are going to try to go at this both retrograde and anterograde to see if anything can be done to revascularize the  left lower limb. She continues to make nice progress on the remaining wound on the right medial foot on her TMA site and the toe it wounds on the left are responding nicely as well 3/12; the patient underwent an extensive and complicated revascularization on the left leg by Dr. Fletcher Anon on 3/3; she had an atherectomy on occluded left popliteal artery; anterior tibial artery followed by a balloon angioplasty. Atherectomy was also performed to the left SFA because there was still significant 50% stenosis in the left popliteal artery they performed an intravascular lithotripsy which improved the residual stenosis to 20%. The same lithotripsy was used to dilate the proximal left SFA. She had a drug-eluting stent placed I believe in the SFA. The patient returned for hyperbarics this week. She has had some eye problems on the right which she tells me are secondary to diabetic retinopathy and she saw her eye doctor. She is really made excellent progress there is no open wounds on the left foot at all. She has one open area medially and her TMA site on the right lateral wound is closed. 3/19; the patient comes in today with the area on the medial transmet on the right improved. Some debris removed from the surface revealed the still open wound. ooUnfortunately she now has the open area on the third toe tip and the medial aspect of the dorsal fourth toe. These are in the same location as her previous wounds. These had actually closed up last week. 3/26, this is acomplex patient with  type 2 diabetes and severe diabetic angiopathy. She is undergone complex revascularizations on the right by Dr. Trula Slade and Dr. Gwenlyn Found and I believe Dr. Fletcher Anon worked on the tibial vessels on the left most recently.she had a complex transmetatarsal amputation wound with underlying osteomyelitis on presentation to the clinic and then developed deep wounds on the plantar left third toe tip and on the medial part of the left fourth toe  at the PIP. She completed IVantibiotics as directed by infectious disease and I believe a course of oral antibiotics as well.she underwent a complete 40 treatments of hyperbarics. She comes in today with the vast majority of the right TMA site healed there is a small opening on the most medial aspect. Her left third and left fourth toes appear to have epithelialization although this has fluctuated somewhat. 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 She had a gaping wound on the right transmetatarsal amputation L as well as deep wounds on the left third and left fourth toes. She underwent complex revascularizations on her bilateral lower legs which I described on her last visit. She completed IV antibiotics by infectious disease and 40 treatments of hyperbarics. Her left third and fourth toes are healed. The area on the right transmetatarsal amputation site is also closed today. READMISSION 08/04/2020 [name] is now a 79 year old woman that we had for a complex stay in our clinic from October 2000 14 January 2020. 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 Her current problems with wounds began on October 30 when she developed a spontaneous opening over the right transmet medial aspect over the first metatarsal head. She has been using collagen on this that she had leftover from her last stay in this clinic. She also has had an open area on the medial right calf from a vein harvest site from her CABG she. She said that this is never really closed since the vein harvest was done. With regards to her arterial status this is a major issue. She had an angiogram by Dr. Gwenlyn Found in September 2020. She developed a gangrenous right first and second toes. Ultimately she was referred to Dr. Trula Slade who did a right common femoral endarterectomy with patch  angioplasty. This was on 07/21/2019 and she really had a good result. Dr. Gwenlyn Found did a atherectomy followed by a drug coated balloon angioplasty of her right SFA popliteal and tibial peroneal trunk on 08/03/2019 again with excellent excellent results. Follow-up.Dopplers in November revealed revealed a widely patent SFA and tibioperoneal trunk. UNFORTUNATELY her recent angiogram that was done on 08/01/2020 showed an 80% right common femoral artery stenosis just above the previous endarterectomy site occluded right right SFA at the origin reconstituting the adductor canal by the profunda femoris collaterals. The profunda femoris is also diffusely diseased. There is and again another 80% tibioperoneal trunk stenosis and one-vessel runoff via the peroneal artery. She is seen Dr. Donzetta Matters and she is scheduled for a femoral endarterectomy and right femoral-popliteal bypass grafting with endarterectomy of her tibial peroneal trunk. The scheduled surgery is on 11/23 The patient does not complain of pain at rest. She does have 5-minute walking claudication. The wounds on her right first met head remanent was spontaneous she does not think she hit this. As mentioned in the area on her right medial calf is a vein harvest  site Dr. Gwenlyn Found had texted me about getting her in the clinic and we have arranged this as soon as we can. I have told her that we will have to see how successful Dr. Donzetta Matters is before we know what can be done about the wounds in a precise fashion. Fortunately there does not seem to be any obvious infection involving the right foot although that may need to be looked at if things really deteriorate. She was treated with IV antibiotics and hyperbaric oxygen for her previous osteomyelitis 12/2; the patient underwent a right femoral endarterectomy and bypass femoral to popliteal artery. This was done on 08/16/2020. She is done remarkably well. She has a small area on the right anterior tibia and a small area  in the medial part of her original right transmetatarsal amputation. She appears to have had an excellent response to surgery. We have been using Objective Constitutional Sitting or standing Blood Pressure is within target range for patient.. Pulse regular and within target range for patient.Marland Kitchen Respirations regular, non-labored and within target range.. Temperature is normal and within the target range for the patient.Marland Kitchen Appears in no distress. Vitals Time Taken: 9:20 AM, Temperature: 97.4 F, Pulse: 93 bpm, Respiratory Rate: 18 breaths/min, Blood Pressure: 127/85 mmHg, Capillary Blood Glucose: 125 mg/dl. Cardiovascular Both dorsalis pedis and posterior tibial pulses are palpable although reduced. Anything here is truly remarkable. General Notes: Wound exam ooOver the right first metatarsal a small linear area also the right mid tibia area. Both of these had surface slough that I removed with a #3 curette and there is viable bleeding tissue underneath. Hemostasis with direct pressure Integumentary (Hair, Skin) Wound #11 status is Open. Original cause of wound was Gradually Appeared. The wound is located on the Right,Medial Amputation Site - Transmetatarsal. The wound measures 0.2cm length x 0.4cm width x 0.1cm depth; 0.063cm^2 area and 0.006cm^3 volume. There is Fat Layer (Subcutaneous Tissue) exposed. There is no tunneling or undermining noted. There is a medium amount of serosanguineous drainage noted. The wound margin is flat and intact. There is medium (34-66%) pink, pale granulation within the wound bed. There is a medium (34-66%) amount of necrotic tissue within the wound bed including Adherent Slough. Wound #12 status is Open. Original cause of wound was Surgical Injury. The wound is located on the Right,Medial Lower Leg. The wound measures 0.6cm length x 1cm width x 0.1cm depth; 0.471cm^2 area and 0.047cm^3 volume. There is no tunneling or undermining noted. There is a medium amount  of serosanguineous drainage noted. The wound margin is flat and intact. There is no granulation within the wound bed. There is a large (67-100%) amount of necrotic tissue within the wound bed including Adherent Slough. 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 There was a Excisional Skin/Subcutaneous Tissue Debridement with a total area of 0.08 sq cm performed by Ricard Dillon., MD. With the following instrument(s): Curette to remove Viable and Non-Viable tissue/material. Material removed includes Subcutaneous Tissue, Slough, Skin: Dermis, and Fibrin/Exudate after achieving pain control using Lidocaine 4% T opical Solution. A time out was conducted at 09:48,  prior to the start of the procedure. A Minimum amount of bleeding was controlled with Pressure. The procedure was tolerated well with a pain level of 0 throughout and a pain level of 0 following the procedure. Post Debridement Measurements: 0.2cm length x 0.4cm width x 0.1cm depth; 0.006cm^3 volume. Character of Wound/Ulcer Post Debridement is improved. Severity of Tissue Post Debridement is: Fat layer exposed. 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 There was a Excisional Skin/Subcutaneous Tissue Debridement with a total area of 0.6 sq cm performed by  Ricard Dillon., MD. With the following instrument(s): Curette to remove Viable and Non-Viable tissue/material. Material removed includes Subcutaneous Tissue, Slough, Skin: Dermis, and Fibrin/Exudate after achieving pain control using Lidocaine 4% Topical Solution. A time out was conducted at 09:48, prior to the start of the procedure. A Minimum amount of bleeding was controlled with Pressure. The procedure was tolerated well with a pain level of 0 throughout and a pain level of 0 following the procedure. Post Debridement Measurements: 0.6cm length x 1cm width x 0.1cm depth; 0.047cm^3 volume. Character of Wound/Ulcer Post Debridement is improved. Severity of Tissue Post Debridement is: Fat layer exposed. Post procedure Diagnosis Wound #12: Same as Pre-Procedure Plan Follow-up Appointments: Return Appointment in 2 weeks. Bathing/ Shower/ Hygiene: May shower and wash wound with soap and water. - with dressing changes. Edema Control - Lymphedema / SCD / Other: Elevate legs to the level of the heart or above for 30 minutes daily and/or when sitting, a frequency of: - 3-4 times throughout the day. Avoid standing for long periods of time. Off-Loading: Wound #11 Right,Medial Amputation Site - Transmetatarsal: Open toe surgical [name] to: - right foot. Home Health: New wound care orders this week; continue Home Health for wound care. May utilize formulary equivalent dressing for wound treatment orders unless otherwise specified. - [facility] to change twice a week. WOUND #11: - Amputation Site - Transmetatarsal Wound Laterality: Right, Medial Cleanser: Soap and Water (Home Health) 3 x Per Day/15 Days Discharge Instructions: May shower and wash wound with dial antibacterial soap and water prior to dressing change. Peri-Wound Care: Skin Prep (Home Health) 3 x Per Day/15 Days Discharge Instructions: Use skin prep as directed Prim Dressing: IODOFLEX 0.9% Cadexomer Iodine Pad 4x6 cm (Home  Health) 3 x Per Day/15 Days ary Discharge Instructions: Or iodosorb if iodoflex not available Apply to wound bed Secondary Dressing: ComfortFoam Border, 3x3 in (silicone border) (Home Health) 3 x Per Day/15 Days Discharge Instructions: Or gauze and kerlix. Apply over primary dressing as directed. WOUND #12: - Lower Leg Wound Laterality: Right, Medial Cleanser: Soap and Water (Home Health) 3 x Per Day/15 Days Discharge Instructions: May shower and wash wound with dial antibacterial soap and water prior to dressing change. Peri-Wound Care: Skin Prep (Home Health) 3 x Per Day/15 Days Discharge Instructions: Use skin prep as directed Prim Dressing: IODOFLEX 0.9% Cadexomer Iodine Pad 4x6 cm (Home Health) 3 x Per Day/15 Days ary Discharge Instructions: Or iodosorb if iodoflex not available Apply to wound bed Secondary Dressing: ComfortFoam Border, 3x3 in (silicone border) (Home Health) 3 x Per Day/15 Days Discharge Instructions: Or gauze and kerlix. Apply over primary dressing as directed. 1. We are going to continue with the Iodoflex here. She has home health changing the dressing #2 adequate look at her groin wound this  really looked quite satisfactory. 3. I think the patient has had an excellent response to the surgical procedure hopefully this will get her closure 4. Follow-up 2 Electronic Signature(s) Signed: 08/25/2020 12:52:04 PM By: [name] MD Entered By: [name] on 08/25/2020 10:03:43 -------------------------------------------------------------------------------- SuperBill Details Patient Name: Date of Service: [name], [name] 08/25/2020 Medical Record Number: [id number] Patient Account Number: [id number] Date of Birth/Sex: Treating RN: 04-12-1941 (79 y.o. 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 Facility Procedures CPT4 Code: [id number] Description: 62947 - DEB SUBQ TISSUE 20 SQ CM/< ICD-10 Diagnosis Description L97.518 Non-pressure chronic ulcer of other part of right foot with other specified sev 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

## 2020-08-25 NOTE — Telephone Encounter (Signed)
Patient called to report small quarter of an inch opening in her incision with small amount of drainage that looks "like dried blood". Patient denies swelling, redness, smell, or any pain that she considers abnormal. HH will see patient tomorrow. Instructed patient to keep wound clean, dry and covered and to call back to report any s/s of infection. Patient verbalizes understanding.

## 2020-08-26 DIAGNOSIS — I70213 Atherosclerosis of native arteries of extremities with intermittent claudication, bilateral legs: Secondary | ICD-10-CM | POA: Diagnosis not present

## 2020-08-26 DIAGNOSIS — E113493 Type 2 diabetes mellitus with severe nonproliferative diabetic retinopathy without macular edema, bilateral: Secondary | ICD-10-CM | POA: Diagnosis not present

## 2020-08-26 DIAGNOSIS — Z48812 Encounter for surgical aftercare following surgery on the circulatory system: Secondary | ICD-10-CM | POA: Diagnosis not present

## 2020-08-26 DIAGNOSIS — E11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: Secondary | ICD-10-CM | POA: Diagnosis not present

## 2020-08-26 DIAGNOSIS — T8789 Other complications of amputation stump: Secondary | ICD-10-CM | POA: Diagnosis not present

## 2020-08-26 DIAGNOSIS — I2510 Atherosclerotic heart disease of native coronary artery without angina pectoris: Secondary | ICD-10-CM | POA: Diagnosis not present

## 2020-08-28 DIAGNOSIS — E113493 Type 2 diabetes mellitus with severe nonproliferative diabetic retinopathy without macular edema, bilateral: Secondary | ICD-10-CM | POA: Diagnosis not present

## 2020-08-28 DIAGNOSIS — I70213 Atherosclerosis of native arteries of extremities with intermittent claudication, bilateral legs: Secondary | ICD-10-CM | POA: Diagnosis not present

## 2020-08-28 DIAGNOSIS — E11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: Secondary | ICD-10-CM | POA: Diagnosis not present

## 2020-08-28 DIAGNOSIS — Z48812 Encounter for surgical aftercare following surgery on the circulatory system: Secondary | ICD-10-CM | POA: Diagnosis not present

## 2020-08-28 DIAGNOSIS — T8789 Other complications of amputation stump: Secondary | ICD-10-CM | POA: Diagnosis not present

## 2020-08-28 DIAGNOSIS — I2510 Atherosclerotic heart disease of native coronary artery without angina pectoris: Secondary | ICD-10-CM | POA: Diagnosis not present

## 2020-08-30 ENCOUNTER — Other Ambulatory Visit: Payer: Self-pay

## 2020-08-30 ENCOUNTER — Other Ambulatory Visit (INDEPENDENT_AMBULATORY_CARE_PROVIDER_SITE_OTHER): Payer: Medicare Other

## 2020-08-30 ENCOUNTER — Encounter (HOSPITAL_COMMUNITY)
Admission: RE | Admit: 2020-08-30 | Discharge: 2020-08-30 | Disposition: A | Discharge status: Home / Self Care | Payer: Medicare Other | Source: Ambulatory Visit | Attending: Cardiovascular Disease | Admitting: Cardiovascular Disease

## 2020-08-30 DIAGNOSIS — E1165 Type 2 diabetes mellitus with hyperglycemia: Secondary | ICD-10-CM

## 2020-08-30 LAB — BASIC METABOLIC PANEL
BUN: 17 mg/dL (ref 6–23)
CO2: 28 mEq/L (ref 19–32)
Calcium: 10.3 mg/dL (ref 8.4–10.5)
Chloride: 103 mEq/L (ref 96–112)
Creatinine, Ser: 0.95 mg/dL (ref 0.40–1.20)
GFR: 57.4 mL/min — ABNORMAL LOW (ref >60.00)
Glucose, Bld: 140 mg/dL — ABNORMAL HIGH (ref 70–99)
Potassium: 4.2 mEq/L (ref 3.5–5.1)
Sodium: 141 mEq/L (ref 135–145)

## 2020-08-30 LAB — MICROALBUMIN / CREATININE URINE RATIO
Creatinine,U: 50.3 mg/dL
Microalb Creat Ratio: 2 mg/g (ref 0.0–30.0)
Microalb, Ur: 1 mg/dL (ref 0.0–1.9)

## 2020-08-30 LAB — HEMOGLOBIN A1C: Hgb A1c MFr Bld: 6.5 % (ref 4.6–6.5)

## 2020-08-30 NOTE — Progress Notes (Signed)
Patient's virtual cardiac rehab program is scheduled to end on 09/02/20. Unable to schedule patient for follow-up exit walk test due to recent right fem-pop bypass surgery. Patient had been walking as tolerated up until surgery. Patient very pleasant on the phone and will discuss when she can get back to walking at her follow-up visit with her surgeon in January.  Patient's virtual cardiac rehab report will be scanned to media for review.  Sol Passer, MS, ACSM CEP 08/30/20 1422

## 2020-09-01 DIAGNOSIS — I70213 Atherosclerosis of native arteries of extremities with intermittent claudication, bilateral legs: Secondary | ICD-10-CM | POA: Diagnosis not present

## 2020-09-01 DIAGNOSIS — T8789 Other complications of amputation stump: Secondary | ICD-10-CM | POA: Diagnosis not present

## 2020-09-01 DIAGNOSIS — I2510 Atherosclerotic heart disease of native coronary artery without angina pectoris: Secondary | ICD-10-CM | POA: Diagnosis not present

## 2020-09-01 DIAGNOSIS — E11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: Secondary | ICD-10-CM | POA: Diagnosis not present

## 2020-09-01 DIAGNOSIS — Z48812 Encounter for surgical aftercare following surgery on the circulatory system: Secondary | ICD-10-CM | POA: Diagnosis not present

## 2020-09-01 DIAGNOSIS — E113493 Type 2 diabetes mellitus with severe nonproliferative diabetic retinopathy without macular edema, bilateral: Secondary | ICD-10-CM | POA: Diagnosis not present

## 2020-09-02 ENCOUNTER — Encounter: Payer: Self-pay | Admitting: Endocrinology

## 2020-09-02 ENCOUNTER — Ambulatory Visit (INDEPENDENT_AMBULATORY_CARE_PROVIDER_SITE_OTHER): Payer: Medicare Other | Admitting: Endocrinology

## 2020-09-02 ENCOUNTER — Other Ambulatory Visit: Payer: Self-pay

## 2020-09-02 VITALS — BP 140/62 | HR 76 | Temp 98.2°F | Ht 64.5 in | Wt 145.0 lb

## 2020-09-02 DIAGNOSIS — E1159 Type 2 diabetes mellitus with other circulatory complications: Secondary | ICD-10-CM

## 2020-09-02 DIAGNOSIS — I6523 Occlusion and stenosis of bilateral carotid arteries: Secondary | ICD-10-CM | POA: Diagnosis not present

## 2020-09-02 NOTE — Progress Notes (Signed)
Patient ID: [name], female   DOB: 05/14/1941, 80 y.o.   MRN: [id number]          Reason for Appointment: Follow-up for endocrinology problems   History of Present Illness:          Date of diagnosis of type 2 diabetes mellitus: 1984       Background history:   She has been on metformin since about the time of her diagnosis and has been on other drugs also but has not had many changes in her regimen in the last several years She thinks her blood sugar used to be fairly well controlled At some point she was given Byetta for about a year and a half which improved her control but this was subsequently start because of it being too expensive for her.  She had no side effects with this  Recent history:   Her A1c is down to 6.5 compared to 7.1  Non-insulin hypoglycemic regimen: Glipizide ER 5 mg daily, Invokana 300 mg daily,   metformin 1 g twice daily  Current management, blood sugar patterns and problems identified:   She is having better control with increasing her Invokana to 300 mg  Checking blood sugars only in the morning again despite reminders to check readings after meals  She has not changed her diet and has no weight change after her vascular surgery recently  She is usually trying to eat healthy meals  Also has had decreased activity level  No change in renal function with increasing Invokana  Also no hypoglycemic symptoms anytime        Side effects from medications have been: None  Compliance with the medical regimen: Good Hypoglycemia:   None  Glucose monitoring:  About 1 times a day         Glucometer: One Touch.       Blood Glucose readings   AVERAGE 127, previously 120  BLOOD sugar range for the last 30 days 88-169   Self-care: The diet that the patient has been following is: tries to limit sweets and fried food.     Meal times are:  Breakfast is at 8:30 AM lunch: 1 PM, dinner: 6:30 PM  Typical meal intake: Breakfast is yogurt with fruit or  egg/toast or oatmeal Trying to get some boost with protein  Lunch is usually soup with crackers, dinner is usually a low-fat meat with vegetables Snacks will be celery and ranch dressing               Dietician visit, most recent: 10/18                Weight history:  Wt Readings from Last 3 Encounters:  09/02/20 145 lb (65.8 kg)  08/16/20 140 lb (63.5 kg)  08/10/20 143 lb 3.2 oz (65 kg)    Glycemic control:   Lab Results  Component Value Date   HGBA1C 6.5 08/30/2020   HGBA1C 7.1 (H) 08/10/2020   HGBA1C 6.8 (H) 06/07/2020   Lab Results  Component Value Date   MICROALBUR 1.0 08/30/2020   LDLCALC 33 08/17/2020   CREATININE 0.95 08/30/2020   Lab Results  Component Value Date   MICRALBCREAT 2.0 08/30/2020    Lab Results  Component Value Date   FRUCTOSAMINE 243 03/18/2019   FRUCTOSAMINE 268 10/21/2018   FRUCTOSAMINE 267 05/30/2018      Allergies as of 09/02/2020      Reactions   Bactrim [sulfamethoxazole-trimethoprim] Other (See Comments)   ^ K+(  elevated)    Demerol [meperidine]    Delusional    Scopolamine    Delusional    Versed [midazolam] Anxiety   Frantic, out of my mind, agitated       Medication List       Accurate as of September 02, 2020 11:59 PM. If you have any questions, ask your nurse or doctor.        acetaminophen 500 MG tablet Commonly known as: TYLENOL Take 1,000 mg by mouth every 6 (six) hours as needed for moderate pain or headache.   aspirin EC 81 MG tablet Take 81 mg by mouth at bedtime.   atorvastatin 80 MG tablet Commonly known as: LIPITOR Take 1 tablet (80 mg total) by mouth daily. What changed: when to take this   calcium carbonate 500 MG chewable tablet Commonly known as: TUMS - dosed in mg elemental calcium Chew 2 tablets by mouth daily as needed for indigestion or heartburn.   canagliflozin 300 MG Tabs tablet Commonly known as: Invokana TAKE 1 TABLET BY MOUTH BEFORE BREAKFAST What changed:   how much to  take  how to take this  when to take this  additional instructions   CINNAMON PO Take 2 tablets by mouth in the morning, at noon, and at bedtime.   clopidogrel 75 MG tablet Commonly known as: PLAVIX Take 1 tablet by mouth once daily   [employer] SENIOR PROBIOTIC PO Take 1 capsule by mouth daily with breakfast.   ferrous sulfate 325 (65 FE) MG tablet Take 1 tablet (325 mg total) by mouth daily.   glipiZIDE 5 MG 24 hr tablet Commonly known as: GLUCOTROL XL Take 1 tablet by mouth once daily with breakfast What changed: See the new instructions.   hydrochlorothiazide 12.5 MG tablet Commonly known as: HYDRODIURIL Take 1 tablet (12.5 mg total) by mouth daily.   metFORMIN 1000 MG tablet Commonly known as: GLUCOPHAGE Take 1 tablet by mouth twice daily   metoprolol tartrate 25 MG tablet Commonly known as: LOPRESSOR Take 1.5 tablets (37.5 mg total) by mouth 2 (two) times daily.   oxyCODONE-acetaminophen 5-325 MG tablet Commonly known as: PERCOCET/ROXICET Take 1-2 tablets by mouth every 4 (four) hours as needed for moderate pain.   pantoprazole 40 MG tablet Commonly known as: PROTONIX Take 1 tablet (40 mg total) by mouth daily. What changed: when to take this   pyridoxine 100 MG tablet Commonly known as: B-6 Take 100 mg by mouth every evening.   senna-docusate 8.6-50 MG tablet Commonly known as: Senokot-S Take 1 tablet by mouth daily.   SYSTANE OP Place 1 drop into both eyes 2 (two) times daily as needed (Dry eyes).   telmisartan 80 MG tablet Commonly known as: MICARDIS Take 1 tablet by mouth once daily What changed: when to take this   vitamin B-12 500 MCG tablet Commonly known as: CYANOCOBALAMIN Take 500 mcg by mouth daily.   vitamin C 100 MG tablet Take 100 mg by mouth daily with supper.       Allergies:  Allergies  Allergen Reactions  . Bactrim [Sulfamethoxazole-Trimethoprim] Other (See Comments)    ^ K+( elevated)   . Demerol [Meperidine]      Delusional   . Scopolamine     Delusional   . Versed [Midazolam] Anxiety    Frantic, out of my mind, agitated     Past Medical History:  Diagnosis Date  . 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 Arthritis   . Cancer (  [place])    removal from nose - MOSE procedure   . Complication of anesthesia    VERSED- agitated, muscle spasms, "jerking" , frantic , (never had this occurence in the [place])   . Coronary artery disease   . Diabetes mellitus without complication ([place])   . Dysrhythmia    PVC's  . GERD (gastroesophageal reflux disease)   . Heart murmur   . History of hiatal hernia   . Hyperlipidemia   . Hypertension 11/20/11   ECHO- EF>55% Borderline concentric left ventricular hypertrophy. There is a small calcified mass in the L:A near the LA appendage. No valvular masses seen with associated mitral annular calcification. LA Volume/ BSA27.4 ml/m2 No AS. Right ventricular systolic pressure is elevated at 46mmHg.  . Myocardial infarction [facility])    June 2021  . Neuromuscular disorder ([facility])    neuropathy in bilateral feet  . Peripheral vascular disease ([place])   . Pneumonia    not hosp.   . 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  PTCAs with drug coated balloon Lt ant tibial artery and Lt popliteal artery  11/26/2019    Past Surgical History:  Procedure Laterality Date  . ABDOMINAL AORTAGRAM  11/25/2019   ABDOMINAL AORTOGRAM W/LOWER EXTREMITY   . ABDOMINAL AORTOGRAM N/A 06/15/2019   Procedure: ABDOMINAL AORTOGRAM;  Surgeon: [name], MD;  Location: [place] [facility];  Service: Cardiovascular;  Laterality: N/A;  . ABDOMINAL AORTOGRAM W/LOWER EXTREMITY N/A 11/25/2019   Procedure: ABDOMINAL AORTOGRAM W/LOWER EXTREMITY;  Surgeon: [name], MD;  Location: [place] [facility];  Service: Cardiovascular;  Laterality: N/A;  Lt leg  . ABDOMINAL AORTOGRAM W/LOWER EXTREMITY N/A 08/01/2020   Procedure: ABDOMINAL AORTOGRAM W/LOWER EXTREMITY;  Surgeon: [name], MD;   Location: [place] [facility];  Service: Cardiovascular;  Laterality: N/A;  . APPENDECTOMY    . APPLICATION OF WOUND VAC Right 07/21/2019   Procedure: Application Of Prevena Wound Vac Right Groin;  Surgeon: [name], MD;  Location: [place];  Service: Vascular;  Laterality: Right;  . CARDIAC CATHETERIZATION    . CARPAL TUNNEL RELEASE    . CARPAL TUNNEL RELEASE    . CESAREAN SECTION     x 2  . COLONOSCOPY    . CORONARY ARTERY BYPASS GRAFT N/A 03/24/2020   Procedure: CORONARY ARTERY BYPASS GRAFTING (CABG) using LIMA to LAD (m); RIMA to RAMUS; Endoscopic Right Greater Saphenous Vein: SVG to Diag1; SVG to PLB (right); and SVG to PL (left).;  Surgeon: [name], MD;  Location: [place];  Service: Open Heart Surgery;  Laterality: N/A;  BILATERAL IMA  . ENDARTERECTOMY FEMORAL Right 07/21/2019   Procedure: RIGHT ENDARTERECTOMY FEMORAL WITH PATCH ANGIOPLASTY;  Surgeon: [name], MD;  Location: [place];  Service: Vascular;  Laterality: Right;  . ENDARTERECTOMY FEMORAL Right 08/16/2020   Procedure: RIGHT FEMORAL ENDARTERECTOMY;  Surgeon: [name], MD;  [facility];  Service: Vascular;  Laterality: Right;  . ENDOVEIN HARVEST OF GREATER SAPHENOUS VEIN Right 03/24/2020   Procedure: ENDOVEIN HARVEST OF [place];  Surgeon: [name], MD;  Location: [place];  Service: Open Heart Surgery;  Laterality: Right;  . EYE SURGERY     cataract removal bilaterally  . FEMORAL-POPLITEAL BYPASS GRAFT Right 08/16/2020   Procedure: BYPASS GRAFT FEMORAL-POPLITEAL ARTERY;  Surgeon: [name], MD;  Location: [place];  Service: Vascular;  Laterality: Right;  . INTRAVASCULAR LITHOTRIPSY  11/25/2019   Procedure: INTRAVASCULAR LITHOTRIPSY;  Surgeon: [name], MD;  Location: [place] [facility];  Service: Cardiovascular;;  LT. SFA  . LEFT HEART CATH AND CORONARY ANGIOGRAPHY N/A 03/22/2020   Procedure: LEFT HEART CATH AND CORONARY ANGIOGRAPHY;  Surgeon: Martinique,  Peter M, MD;  Location: [place] [facility];  Service: Cardiovascular;  Laterality: N/A;  . LOWER EXTREMITY ANGIOGRAPHY Bilateral 06/15/2019   Procedure: Lower Extremity Angiography;  Surgeon: [name], MD;  Location: [place] [facility];  Service: Cardiovascular;  Laterality: Bilateral;  . LOWER EXTREMITY ANGIOGRAPHY Right 08/03/2019   Procedure: LOWER EXTREMITY ANGIOGRAPHY;  Surgeon: [name], MD;  Location: [place] [facility];  Service: Cardiovascular;  Laterality: Right;  . PATCH ANGIOPLASTY Right 07/21/2019   Procedure: Patch Angioplasty Right Femoral Artery;  Surgeon: [name], MD;  Location: [place];  Service: Vascular;  Laterality: Right;  . PERIPHERAL VASCULAR ATHERECTOMY  08/03/2019   Procedure: PERIPHERAL VASCULAR ATHERECTOMY;  Surgeon: [name], MD;  Location: [place] [facility];  Service: Cardiovascular;;  right SFA, right TP trunk  . PERIPHERAL VASCULAR ATHERECTOMY  11/25/2019   Procedure: PERIPHERAL VASCULAR ATHERECTOMY;  Surgeon: [name], MD;  Location: [place] [facility];  Service: Cardiovascular;;  Lt.  POPLITEAL and AT  . PERIPHERAL VASCULAR BALLOON ANGIOPLASTY Left 06/15/2019   Procedure: PERIPHERAL VASCULAR BALLOON ANGIOPLASTY;  Surgeon: [name], MD;  Location: [place] [facility];  Service: Cardiovascular;  Laterality: Left;  SFA UNSUCCESSFUL UNABLE TO CROSS LESION  . PERIPHERAL VASCULAR BALLOON ANGIOPLASTY  08/03/2019   Procedure: PERIPHERAL VASCULAR BALLOON ANGIOPLASTY;  Surgeon: [name], MD;  Location: [place] [facility];  Service: Cardiovascular;;  right SFA, Right TP trunk  . TEE WITHOUT CARDIOVERSION N/A 03/24/2020   Procedure: TRANSESOPHAGEAL ECHOCARDIOGRAM (TEE);  Surgeon: [name], MD;  Location: [place];  Service: Open Heart Surgery;  Laterality: N/A;  . TONSILLECTOMY     and adenoidectomy  . 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 TUBAL LIGATION      Family History  Problem Relation Age of Onset  . Cancer - Prostate Father   . Cancer - Colon Father   . Stroke Mother   . Hypertension Mother   . Hyperlipidemia Mother   . Melanoma Brother     Social History:  reports that she quit smoking about 48 years ago. She has never used smokeless tobacco. She reports that she does not drink alcohol and does not use drugs.   Review of Systems   Lipid history: Hypercholesterolemia by PCP with 20 mg Lipitor    Lab Results  Component Value Date   CHOL 77 08/17/2020   HDL 31 (L) 08/17/2020   LDLCALC 33 08/17/2020   TRIG 63 08/17/2020   CHOLHDL 2.5 08/17/2020           Hypertension: Present for several years, currently treated with  Micardis 40 mg, metoprolol, diltiazem and HCTZ, also followed by cardiologist Micardis was reduced in 06/2019 because of low normal blood pressure readings  She says her blood pressure at home is about 469 systolic recently and usually stable  Also on 300 mg Invokana, renal function stable  BP Readings from Last 3 Encounters:  09/02/20 140/62  08/18/20 (!) 114/44  08/10/20 140/63   Lab Results  Component Value Date   CREATININE 0.95 08/30/2020   CREATININE 0.95 08/18/2020   CREATININE  1.09 (H) 08/17/2020     Most recent eye exam was in 4/21    Lab Results  Component Value Date   HGB 7.0 (L) 08/18/2020     She has had significant PAD and followed by vascular cardiologist   LABS:  Lab on 08/30/2020  Component Date Value Ref Range Status  . Microalb, Ur 08/30/2020 1.0  0.0 - 1.9 mg/dL Final  . Creatinine,U 08/30/2020 50.3  mg/dL Final  . Microalb Creat Ratio 08/30/2020 2.0  0.0 - 30.0 mg/g Final  . Sodium 08/30/2020 141  135 - 145 mEq/L Final  . Potassium 08/30/2020 4.2  3.5 - 5.1 mEq/L Final  . Chloride 08/30/2020 103  96 - 112 mEq/L Final  . CO2 08/30/2020 28  19 - 32 mEq/L Final  . Glucose, Bld  08/30/2020 140* 70 - 99 mg/dL Final  . BUN 08/30/2020 17  6 - 23 mg/dL Final  . Creatinine, Ser 08/30/2020 0.95  0.40 - 1.20 mg/dL Final  . GFR 08/30/2020 57.40* >60.00 mL/min Final   Calculated using the CKD-EPI Creatinine Equation (2021)  . Calcium 08/30/2020 10.3  8.4 - 10.5 mg/dL Final  . Hgb A1c MFr Bld 08/30/2020 6.5  4.6 - 6.5 % Final   Glycemic Control Guidelines for People with Diabetes:Non Diabetic:  <6%Goal of Therapy: <7%Additional Action Suggested:  >8%     Physical Examination:  BP 140/62 (BP Location: Left Arm, Patient Position: Sitting, Cuff Size: Large)   Pulse 76   Temp 98.2 F (36.8 C) (Oral)   Ht 5' 4.5" (1.638 m)   Wt 145 lb (65.8 kg)   SpO2 97%   BMI 24.50 kg/m       ASSESSMENT:  Diabetes type 2  See history of present illness for detailed discussion of current diabetes management, blood sugar patterns and problems identified  Her A1c is stable at 6.5 and improved  Currently on Invokana, Metformin and glipizide  Although her fasting reading may be trending a little higher since her vascular surgery about 3 weeks ago this is likely to be from recent stress  She is generally doing well with her diet although not able to exercise Currently her BMI is at an ideal level No hypoglycemia with glipizide She is tolerating Invokana well and no change in renal function also  Appears that her insurance does not cover [place] or [place]  Hypertension: Blood pressure is well controlled again Microalbumin normal  PLAN:    No change in diabetes medicines as above She will call if her blood sugars continue to stay relatively high Discussed importance of checking readings after meals since she can adjust her diet accordingly Resume some walking when she is able to Follow-up in 4 months  Also reminded her to get follow-up labs done with her vascular surgeon for anemia  Patient Instructions  Check blood sugars on waking up 3-4 days a week  Also check  blood sugars about 2 hours after meals and do this after different meals by rotation  Recommended blood sugar levels on waking up are 90-130 and about 2 hours after meal is 130-160  Please bring your blood sugar monitor to each visit, thank you      Elayne Snare 09/03/2020, 3:10 PM   Note: This office note was prepared with Dragon voice recognition system technology. Any transcriptional errors that result from this process are unintentional.

## 2020-09-02 NOTE — Patient Instructions (Signed)
Check blood sugars on waking up 3-4 days a week  Also check blood sugars about 2 hours after meals and do this after different meals by rotation  Recommended blood sugar levels on waking up are 90-130 and about 2 hours after meal is 130-160  Please bring your blood sugar monitor to each visit, thank you   

## 2020-09-04 DIAGNOSIS — I2510 Atherosclerotic heart disease of native coronary artery without angina pectoris: Secondary | ICD-10-CM | POA: Diagnosis not present

## 2020-09-04 DIAGNOSIS — Z48812 Encounter for surgical aftercare following surgery on the circulatory system: Secondary | ICD-10-CM | POA: Diagnosis not present

## 2020-09-04 DIAGNOSIS — T8789 Other complications of amputation stump: Secondary | ICD-10-CM | POA: Diagnosis not present

## 2020-09-04 DIAGNOSIS — E113493 Type 2 diabetes mellitus with severe nonproliferative diabetic retinopathy without macular edema, bilateral: Secondary | ICD-10-CM | POA: Diagnosis not present

## 2020-09-04 DIAGNOSIS — I70213 Atherosclerosis of native arteries of extremities with intermittent claudication, bilateral legs: Secondary | ICD-10-CM | POA: Diagnosis not present

## 2020-09-04 DIAGNOSIS — E11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: Secondary | ICD-10-CM | POA: Diagnosis not present

## 2020-09-08 ENCOUNTER — Other Ambulatory Visit: Payer: Self-pay

## 2020-09-08 ENCOUNTER — Encounter (INDEPENDENT_AMBULATORY_CARE_PROVIDER_SITE_OTHER): Payer: Medicare Other | Admitting: Ophthalmology

## 2020-09-08 ENCOUNTER — Encounter (HOSPITAL_BASED_OUTPATIENT_CLINIC_OR_DEPARTMENT_OTHER): Payer: Medicare Other | Admitting: Internal Medicine

## 2020-09-08 DIAGNOSIS — L97218 Non-pressure chronic ulcer of right calf with other specified severity: Secondary | ICD-10-CM | POA: Diagnosis not present

## 2020-09-08 DIAGNOSIS — I10 Essential (primary) hypertension: Secondary | ICD-10-CM | POA: Diagnosis not present

## 2020-09-08 DIAGNOSIS — E11621 Type 2 diabetes mellitus with foot ulcer: Secondary | ICD-10-CM | POA: Diagnosis not present

## 2020-09-08 DIAGNOSIS — L97519 Non-pressure chronic ulcer of other part of right foot with unspecified severity: Secondary | ICD-10-CM | POA: Diagnosis not present

## 2020-09-08 DIAGNOSIS — L97819 Non-pressure chronic ulcer of other part of right lower leg with unspecified severity: Secondary | ICD-10-CM | POA: Diagnosis not present

## 2020-09-08 DIAGNOSIS — E11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: Secondary | ICD-10-CM | POA: Diagnosis not present

## 2020-09-08 DIAGNOSIS — T8189XA Other complications of procedures, not elsewhere classified, initial encounter: Secondary | ICD-10-CM | POA: Diagnosis not present

## 2020-09-08 DIAGNOSIS — L97518 Non-pressure chronic ulcer of other part of right foot with other specified severity: Secondary | ICD-10-CM | POA: Diagnosis not present

## 2020-09-08 DIAGNOSIS — Z89431 Acquired absence of right foot: Secondary | ICD-10-CM | POA: Diagnosis not present

## 2020-09-09 DIAGNOSIS — Z48812 Encounter for surgical aftercare following surgery on the circulatory system: Secondary | ICD-10-CM | POA: Diagnosis not present

## 2020-09-09 DIAGNOSIS — I70213 Atherosclerosis of native arteries of extremities with intermittent claudication, bilateral legs: Secondary | ICD-10-CM | POA: Diagnosis not present

## 2020-09-09 DIAGNOSIS — I2510 Atherosclerotic heart disease of native coronary artery without angina pectoris: Secondary | ICD-10-CM | POA: Diagnosis not present

## 2020-09-09 DIAGNOSIS — E11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: Secondary | ICD-10-CM | POA: Diagnosis not present

## 2020-09-09 DIAGNOSIS — T8789 Other complications of amputation stump: Secondary | ICD-10-CM | POA: Diagnosis not present

## 2020-09-09 DIAGNOSIS — E113493 Type 2 diabetes mellitus with severe nonproliferative diabetic retinopathy without macular edema, bilateral: Secondary | ICD-10-CM | POA: Diagnosis not present

## 2020-09-09 NOTE — Progress Notes (Signed)
SEPHORA, BOYAR ([id number]) Visit Report for 09/08/2020 HPI Details Patient Name: Date of Service: [name] 09/08/2020 12:30 PM Medical Record Number: [id number] Patient Account Number: [id number] Date of Birth/Sex: Treating RN: 09/09/1941 (79 y.o. [name] Primary Care Provider: [name] Other Clinician: Referring Provider: Treating Provider/[name]: Tressie Stalker in Treatment: 5 History of Present Illness HPI Description: ADMISSION 07/13/2019 Patient is a 79 year old type II diabetic. She has known PAD. She has been followed by Dr. Jacqualyn Posey of podiatry for blistering areas on her toes dating back to 04/30/2019 which at this was the left fourth toe. By 8/11 she had wounds on the right and left second toes. She underwent arterial studies by Dr. Alvester Chou on 7/31 that showed ABIs on the right of 0.60 TBI of 0.26 on the left ABI of 0.56 and a TBI of 0.25. By 9/10 she had ischemic-looking wounds per Dr. Earleen Newport on the right first, left first second and third. She has been using Medihoney and then mupirocin more recently simply Betadine. The patient underwent angiography by Dr. Gwenlyn Found on 9/21. 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 Noted that there was 1 vessel runoff in the foot via the peroneal. On the left there was an 80% left SFA, 70% mid left SFA. There was a short segment calcified CTO distal less than SFA and above-knee popliteal artery reconstituting with two-vessel runoff. 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 It was felt that she had bilateral SFA disease as well as tibial vessel disease. An attempt was made to revascularize the left SFA but they were unable to cross because of the highly calcified nature of the lesion. 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 She has an area on the medial left fourth toe with raised horned callus on top of this. I am not certain what this represents. With regards to pain she has about a 2-1/2 I will claudication tolerance in the grocery store. She has some pain in night which is relieved by putting her feet down over the side of the bed. She is wearing Nitro-Dur patches on the top of her right foot. Past medical history includes type 2 diabetes with secondary PAD, neoplasm of the skin, diabetic retinopathy, carotid artery stenosis, hyperlipidemia hypertension. 11/2; the last time the patient was here I spoke to Dr. Gwenlyn Found about revascularization options on the right. As I understand things currently Dr. Gwenlyn Found spoke with Dr. Trula Slade and ultimately the patient was taken to surgery on 10/27. She had a right iliofemoral endarterectomy with a bovine patch angioplasty. I think the plan now is for her to have a staged intervention on the right SFA by Dr. Alvester Chou. Per the patient's understanding Dr. Gwenlyn Found and Dr. Jacqualyn Posey are waiting to see when the gangrenous toes on the right foot can be amputated. The patient states her pain is a lot better and she is grateful for that. 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 Small area on the left fourth toe. She is using Betadine to the mummified areas on the right and Medihoney on the left 08/27/2019; the last time I saw this patient I discharged her from the clinic. She had been revascularized by Dr.  Brabham and she had a right iliofemoral endarterectomy with a bovine angioplasty. 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 Jacqualyn Posey of podiatry on 08/07/2019. I note that she also had a intervention by Dr. Gwenlyn Found and he performed a directional atherectomy and drug-coated balloon angioplasty of the SFA and popliteal artery on the right. I am  not certain of the date of Dr. Kennon Holter procedure as of the time of this dictation. She was referred back to [place] by Dr. Earleen Newport predominantly for follow-up of the left fourth toe. She still has sutures and stitches in the right TMA site. She states her pain is a lot better. She expresses concern about the condition of the amputation site at the TMA. She is on doxycycline I think prescribed by Dr. Earleen Newport. She is complaining of some pain at night 12/10; I spoke to Dr. Jacqualyn Posey last week. He removed the sutures on the right foot on Monday of this week. She has the area on the left fourth toe just proximal to the PIP and then the right TMA site. She is still on doxycycline and has enough through next week. Unfortunately the TMA site does not look good at all. Both on the lateral and medial part of the incisions are areas that probe to bone. There is purulence over the medial part which I have cultured. We will use silver alginate. Left fourth toe looks somewhat better but there was still exposed bone 12/17; patient has an MRI booked for 12/30. Culture I did last week showed Pseudomonas Serratia and Enterococcus. This was purulent drainage coming out of the medial part of her amputation site. I use cefdinir 300 twice daily for 10 days that started on 12/15. Her x-ray on the right showed limited evaluation for osteomyelitis. The findings could have been postoperative. There was subtle erosion in the distal first and distal fifth metatarsal. An MRI was suggested. On the left she had irregularity of the fourth middle and proximal phalanx consistent with a history of osteomyelitis. I do not know that she has a history of osteomyelitis in this area. She had a newly defined area on the plantar third toe 12/31; patient's MRI is listed below: MRI OF THE RIGHT FOREFOOT WITHOUT AND WITH CONTRAST TECHNIQUE: Multiplanar, multisequence MR imaging of the right forefoot was performed before and after the administration of  intravenous contrast. CONTRAST 6 mL GADAVIST IV SOLN : COMPARISON: Plain films right foot 09/04/2019. FINDINGS: Bones/Joint/Cartilage The patient is status post transmetatarsal amputation as seen on the prior exam. Marrow edema and enhancement are seen in all of the distal metatarsals. In the first metatarsal, signal change extends 1.5 cm proximal to the stump and in the second metatarsal extends approximately 2 cm proximal to the stump. Edema and enhancement are seen in only the distal 0.7 cm of the third metatarsal stump and tips of the fourth and fifth metatarsals. Bone marrow signal is otherwise unremarkable. A small focus of subchondral edema is seen in the lateral talus, likely degenerative. Ligaments Intact. Muscles and Tendons No intramuscular fluid collection. Soft tissues Skin ulceration is seen off the stump of the first metatarsal. A thin fluid collection tracks deep to the wound and over the anterior metatarsals worrisome for small abscess. Intense subcutaneous edema and enhancement are seen diffusely. 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 Cellulitis about the foot. Skin ulceration over  the distal first metatarsal is identified with a thin fluid collection tracking anteriorly along the stump worrisome for abscess. Small focus of subchondral edema in the lateral talus is likely degenerative. Electronically Signed By: Inge Rise M.D. On: 09/23/2019 15:25 Patient arrives in clinic today not complaining any of any pain. 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 She does not describe claudication however her activity is very limited. 10/01/2019; since the patient was last here I have communicated with Dr. Gwenlyn Found after bypass by Dr. Trula Slade and  addressing the right superficial femoral artery he states that she is widely patent through peroneal arteries to the ankle with collaterals to the dorsalis pedis. He states he is going to talk to colleagues about the feasibility of tibial pedal access. The patient seems infectious disease later this afternoon Dr. Baxter Flattery. in preparation for this she has been off antibiotics for 1 week and I went ahead and obtain pieces of the remanence of her first metatarsal for pathology and CandS. The patient is a candidate for hyperbaric oxygen. 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 1/15; considerable improvement in the wounds on her feet. We are using silver collagen. She follows with Dr. Baxter Flattery of infectious disease and is on meropenem and daptomycin. She has been taught how to do this herself at home. Follows with Dr. Graylon Good at the end of treatment here. She has 2 wounds on the surgical TMA site 1 lateral and 1 medial the lateral 1 has regressed tremendously. The area medially still has some exposed bone although the base of this looks healthy. 1/22; 2 separate wounds on the surgical TMA site. Both of these look satisfactory. The medial area does not have any exposed bone. This is an improvement On the left side fourth toe dorsally over the proximal phalanx there is a deep punched out area that probes to bone. She has an area on the tip of the left third toe. She also tells [place] that in HBO she traumatized her left Achilles and this is left her with a superficial wound area 1/28; weekly visit along with HBO. She has 5 wounds. T our punched out areas on the original TMA site on the right. Both of these appear to have contracted. o The area on the right no longer has exposed bone. She has an area on the tip of the left third toe and on the DIP of the left fourth toe. Both of these had surface callus that I removed and unfortunately they have small areas that both go  to bone. She has a traumatic wound on the left Achilles area that happened in HBO and that appears better. She is tolerating her IV antibiotics well at home. She has home health changing the dressings and she is doing it once on the weekends. We have been using silver collagen. She has been extensively revascularized on the right by Dr. Trula Slade and subsequently by Dr. Gwenlyn Found. 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 Baxter Flattery of infectious disease on 2/17. Dr. Gwenlyn Found on 2/23 and according to her her antibiotics stopped on 2/23. She is tolerating hyperbarics well. She has made a tremendous improvement in the right forefoot with only 2 smaller open wounds. Using silver collagen. On the left foot  she has the area on the tip of the left third toe and the medial part of the left fourth toe. These had exposed bone last week I did not sense any of that today 2/12; sees Dr. Graylon Good on 2/17 and Dr. Gwenlyn Found on 2/23. We are going to use Dermagraft on this today however the lateral part of her train TMA incision on the right is healed and the medial part is down so much that we just continue with the silver collagen She has wounds on the tip of her left third and the medial aspect of the left fourth. Both of these still have exposed bone I have not been able to get these to epithelialize. 2/19; she sees Dr. Gwenlyn Found on 2/23 and I have communicated with him about the left vascular supply. Looking at her angiogram from 08/03/2019 it looks as though that they could not cross the left CFA. Noted that she had one-vessel runoff bilaterally. She also apparently saw Dr. Graylon Good although I did not look up these results for preparation of this record. 2/26; Dr. Gwenlyn Found is going to do an angiogram next week on Wednesday. I think they are going to try to go at this both retrograde and anterograde to see  if anything can be done to revascularize the left lower limb. She continues to make nice progress on the remaining wound on the right medial foot on her TMA site and the toe it wounds on the left are responding nicely as well 3/12; the patient underwent an extensive and complicated revascularization on the left leg by Dr. Fletcher Anon on 3/3; she had an atherectomy on occluded left popliteal artery; anterior tibial artery followed by a balloon angioplasty. Atherectomy was also performed to the left SFA because there was still significant 50% stenosis in the left popliteal artery they performed an intravascular lithotripsy which improved the residual stenosis to 20%. The same lithotripsy was used to dilate the proximal left SFA. She had a drug-eluting stent placed I believe in the SFA. The patient returned for hyperbarics this week. She has had some eye problems on the right which she tells me are secondary to diabetic retinopathy and she saw her eye doctor. She is really made excellent progress there is no open wounds on the left foot at all. She has one open area medially and her TMA site on the right lateral wound is closed. 3/19; the patient comes in today with the area on the medial transmet on the right improved. Some debris removed from the surface revealed the still open wound. Unfortunately she now has the open area on the third toe tip and the medial aspect of the dorsal fourth toe. These are in the same location as her previous wounds. These had actually closed up last week. 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 She is undergone complex revascularizations on the right by Dr. Trula Slade and Dr. Gwenlyn Found and I believe Dr. Fletcher Anon worked on the tibial vessels on the left most recently.she had a complex transmetatarsal amputation wound with underlying osteomyelitis on presentation to the clinic and then developed deep wounds on the plantar left third toe tip and  on the medial part of the left fourth toe at the PIP. She completed IVantibiotics as directed by infectious disease and I believe a course of oral antibiotics as well.she underwent a complete 40 treatments of hyperbarics. She comes in today with the vast majority of the right TMA site healed there is a  small opening on the most medial aspect. Her left third and left fourth toes appear to have epithelialization although this has fluctuated somewhat. 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 She had a gaping wound on the right transmetatarsal amputation L as well as deep wounds on the left third and left fourth toes. She underwent complex revascularizations on her bilateral lower legs which I described on her last visit. She completed IV antibiotics by infectious disease and 40 treatments of hyperbarics. Her left third and fourth toes are healed. The area on the right transmetatarsal amputation site is also closed today. READMISSION 08/04/2020 [name] is now a 79 year old woman that we had for a complex stay in our clinic from October 2000 14 January 2020. 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 Her current problems with wounds began on October 30 when she developed a spontaneous opening over the right transmet medial aspect over the first metatarsal head. She has been using collagen on this that she had leftover from her last stay in this clinic. She also has had an open area on the medial right calf from a vein harvest site from her CABG she. She said that this is never really closed since the vein harvest was done. With regards to her arterial status this is a major issue. She had an angiogram by Dr. Gwenlyn Found in September 2020. She developed a gangrenous right first and second toes. Ultimately she was referred to Dr. Trula Slade who did a right  common femoral endarterectomy with patch angioplasty. This was on 07/21/2019 and she really had a good result. Dr. Gwenlyn Found did a atherectomy followed by a drug coated balloon angioplasty of her right SFA popliteal and tibial peroneal trunk on 08/03/2019 again with excellent excellent results. Follow-up.Dopplers in November revealed revealed a widely patent SFA and tibioperoneal trunk. UNFORTUNATELY her recent angiogram that was done on 08/01/2020 showed an 80% right common femoral artery stenosis just above the previous endarterectomy site occluded right right SFA at the origin reconstituting the adductor canal by the profunda femoris collaterals. The profunda femoris is also diffusely diseased. There is and again another 80% tibioperoneal trunk stenosis and one-vessel runoff via the peroneal artery. She is seen Dr. Donzetta Matters and she is scheduled for a femoral endarterectomy and right femoral-popliteal bypass grafting with endarterectomy of her tibial peroneal trunk. The scheduled surgery is on 11/23 The patient does not complain of pain at rest. She does have 5-minute walking claudication. The wounds on her right first met head remanent was spontaneous she does not think she hit this. As mentioned in the area on her right medial calf is a vein harvest site Dr. Gwenlyn Found had texted me about getting her in the clinic and we have arranged this as soon as we can. I have told her that we will have to see how successful Dr. Donzetta Matters is before we know what can be done about the wounds in a precise fashion. Fortunately there does not seem to be any obvious infection involving the right foot although that may need to be looked at if things really deteriorate. She was treated with IV antibiotics and hyperbaric oxygen for her previous osteomyelitis 12/2; the patient underwent a right femoral endarterectomy and bypass femoral to popliteal artery. This was done on 08/16/2020. She is done remarkably well. She has a small  area on  the right anterior tibia and a small area in the medial part of her original right transmetatarsal amputation. She appears to have had an excellent response to surgery. 12/16; the patient's area on the right tibia and the small area on the medial part of her original right transmetatarsal amputation is totally healed Electronic Signature(s) Signed: 09/09/2020 8:01:39 AM By: [name] MD Entered By: [name] on 09/08/2020 13:38:55 -------------------------------------------------------------------------------- Physical Exam Details Patient Name: Date of Service: [name], [name] Calamity LLY H. 09/08/2020 12:30 PM Medical Record Number: [id number] Patient Account Number: [id number] Date of Birth/Sex: Treating RN: 25-Mar-1941 (79 y.o. [name] Primary Care Provider: [name] Other Clinician: Referring Provider: Treating Provider/[name]: Tressie Stalker in Treatment: 5 Constitutional Sitting or standing Blood Pressure is within target range for patient.. Pulse regular and within target range for patient.Marland Kitchen Respirations regular, non-labored and within target range.. Temperature is normal and within the target range for the patient.Marland Kitchen Appears in no distress. Notes Wound exam Over the right first metatarsal head the area is closed on the right anterior upper tibia this is also closed fully epithelialized. She has edema in the right leg which is likely postsurgical. There is no warmth no calf tenderness. Pedal pulses are difficult to feel but I think they are palpable at the both dorsalis pedis and posterior tibial on the right Electronic Signature(s) Signed: 09/09/2020 8:01:39 AM By: [name] MD Signed: 09/09/2020 8:01:39 AM By: [name] MD Entered By: [name] on 09/08/2020 13:39:50 -------------------------------------------------------------------------------- Physician Orders Details Patient Name: Date of Service: [place]. 09/08/2020 12:30 PM Medical Record Number: [id number] Patient Account Number: [id number] Date of Birth/Sex: Treating RN: [birth year] (79 y.o. [name], [name] Primary Care Provider: [name] Other Clinician: Referring Provider: Treating Provider/[name]: Tressie Stalker in Treatment: 5 Verbal / Phone Orders: No Diagnosis Coding ICD-10 Coding Code Description E11.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 E11.621 Type 2 diabetes mellitus with foot ulcer L97.518 Non-pressure chronic ulcer of other part of right foot with other specified severity L97.218 Non-pressure chronic ulcer of right calf with other specified severity Discharge From [facility] Discharge from [place] - call if any future wound care needs. Edema Control - Lymphedema / SCD / Other Elevate legs to the level of the heart or above for 30 minutes daily and/or when sitting, a frequency of: - 3-4 times a day throughout the day. Avoid standing for long periods of time. Moisturize legs daily. Other Edema Control Orders/Instructions: - Patient to start wearing support stockings. apply in the morning and remove at night. [facility] for wound care. - [facility] discharged from wound care standpoint. Electronic Signature(s) Signed: 09/08/2020 5:23:27 PM By: Deon Pilling Signed: 09/09/2020 8:01:39 AM By: [name] MD Entered By: Deon Pilling on 09/08/2020 13:11:18 -------------------------------------------------------------------------------- Problem List Details Patient Name: Date of Service: [name], [name] 09/08/2020 12:30 PM Medical Record Number: [id number] Patient Account Number: [id number] Date of Birth/Sex: Treating RN: 1941-09-07 (79 y.o. [name] Primary Care Provider: [name] Other Clinician: Referring Provider: Treating Provider/[name]: Tressie Stalker in Treatment: 5 Active Problems ICD-10 Encounter Code Description Active Date MDM Diagnosis E11.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 08/04/2020 No Yes E11.621 Type 2 diabetes mellitus with foot ulcer 08/04/2020 No Yes L97.518 Non-pressure chronic ulcer of other part of right foot with other specified 08/04/2020  No Yes severity L97.218 Non-pressure chronic ulcer of right calf with other specified severity 08/04/2020 No Yes Inactive Problems Resolved Problems Electronic Signature(s) Signed: 09/09/2020 8:01:39 AM By: [name] MD Entered By: [name] on 09/08/2020 13:36:53 -------------------------------------------------------------------------------- Progress Note Details Patient Name: Date of Service: [place]. 09/08/2020 12:30 PM Medical Record Number: [id number] Patient Account Number: [id number] Date of Birth/Sex: Treating RN: Jun 11, 1941 (79 y.o. [name] Primary Care Provider: [name] Other Clinician: Referring Provider: Treating Provider/[name]: Tressie Stalker in Treatment: 5 Subjective History of Present Illness (HPI) ADMISSION 07/13/2019 Patient is a 79 year old type II diabetic. She has known PAD. She has been followed by Dr. Jacqualyn Posey of podiatry for blistering areas on her toes dating back to 04/30/2019 which at this was the left fourth toe. By 8/11 she had wounds on the right and left second toes. She underwent arterial studies by Dr. Alvester Chou on 7/31 that showed ABIs on the right of 0.60 TBI of 0.26 on the left ABI of 0.56 and a TBI of 0.25. By 9/10 she had ischemic-looking wounds per Dr. Earleen Newport on the right first, left first second and third. She has been using Medihoney and then mupirocin more recently simply Betadine. The patient underwent angiography by Dr. Gwenlyn Found on 9/21. On the right this showed 90 to 95% calcified distal  right common femoral artery stenosis and a 95% focal mid SFA stenosis followed by an 80% segmental stenosis. Noted that there was 1 vessel runoff in the foot via the peroneal. ooOn the left there was an 80% left SFA, 70% mid left SFA. There was a short segment calcified CTO distal less than SFA and above-knee popliteal artery reconstituting with two-vessel runoff. 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 It was felt that she had bilateral SFA disease as well as tibial vessel disease. An attempt was made to revascularize the left SFA but they were unable to cross because of the highly calcified nature of the lesion. 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 She has an area on the medial left fourth toe with raised horned callus on top of this. I am not certain what this represents. With regards to pain she has about a 2-1/2 I will claudication tolerance in the grocery store. She has some pain in night which is relieved by putting her feet down over the side of the bed. She is wearing Nitro-Dur patches on the top of her right foot. Past medical history includes type 2 diabetes with secondary PAD, neoplasm of the skin, diabetic retinopathy, carotid artery stenosis, hyperlipidemia hypertension. 11/2; the last time the patient was here I spoke to Dr. Gwenlyn Found about revascularization options on the right. As I understand things currently Dr. Gwenlyn Found spoke with Dr. Trula Slade and ultimately the patient was taken to surgery on 10/27. She had a right iliofemoral endarterectomy with a bovine patch angioplasty. I think the plan now is for her to have a staged intervention on the right SFA by Dr. Alvester Chou. Per the patient's understanding Dr. Gwenlyn Found and Dr. Jacqualyn Posey are waiting to see when the gangrenous toes on the right foot can be amputated. The patient states her pain is a lot better and she is  grateful for that. 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 Small area on the left fourth toe. She is using  Betadine to the mummified areas on the right and Medihoney on the left 08/27/2019; the last time I saw this patient I discharged her from the clinic. She had been revascularized by Dr. Trula Slade and she had a right iliofemoral endarterectomy with a bovine angioplasty. 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 Jacqualyn Posey of podiatry on 08/07/2019. I note that she also had a intervention by Dr. Gwenlyn Found and he performed a directional atherectomy and drug-coated balloon angioplasty of the SFA and popliteal artery on the right. I am not certain of the date of Dr. Kennon Holter procedure as of the time of this dictation. She was referred back to [place] by Dr. Earleen Newport predominantly for follow-up of the left fourth toe. She still has sutures and stitches in the right TMA site. She states her pain is a lot better. She expresses concern about the condition of the amputation site at the TMA. She is on doxycycline I think prescribed by Dr. Earleen Newport. She is complaining of some pain at night 12/10; I spoke to Dr. Jacqualyn Posey last week. He removed the sutures on the right foot on Monday of this week. She has the area on the left fourth toe just proximal to the PIP and then the right TMA site. She is still on doxycycline and has enough through next week. Unfortunately the TMA site does not look good at all. Both on the lateral and medial part of the incisions are areas that probe to bone. There is purulence over the medial part which I have cultured. We will use silver alginate. Left fourth toe looks somewhat better but there was still exposed bone 12/17; patient has an MRI booked for 12/30. Culture I did last week showed Pseudomonas Serratia and Enterococcus. This was purulent drainage coming out of the medial part of her amputation site. I use  cefdinir 300 twice daily for 10 days that started on 12/15. Her x-ray on the right showed limited evaluation for osteomyelitis. The findings could have been postoperative. There was subtle erosion in the distal first and distal fifth metatarsal. An MRI was suggested. On the left she had irregularity of the fourth middle and proximal phalanx consistent with a history of osteomyelitis. I do not know that she has a history of osteomyelitis in this area. She had a newly defined area on the plantar third toe 12/31; patient's MRI is listed below: MRI OF THE RIGHT FOREFOOT WITHOUT AND WITH CONTRAST TECHNIQUE: Multiplanar, multisequence MR imaging of the right forefoot was performed before and after the administration of intravenous contrast. CONTRAST 6 mL GADAVIST IV SOLN : COMPARISON: Plain films right foot 09/04/2019. FINDINGS: Bones/Joint/Cartilage The patient is status post transmetatarsal amputation as seen on the prior exam. Marrow edema and enhancement are seen in all of the distal metatarsals. In the first metatarsal, signal change extends 1.5 cm proximal to the stump and in the second metatarsal extends approximately 2 cm proximal to the stump. Edema and enhancement are seen in only the distal 0.7 cm of the third metatarsal stump and tips of the fourth and fifth metatarsals. Bone marrow signal is otherwise unremarkable. A small focus of subchondral edema is seen in the lateral talus, likely degenerative. Ligaments Intact. Muscles and Tendons No intramuscular fluid collection. Soft tissues Skin ulceration is seen off the stump of the first metatarsal. A thin fluid collection tracks deep to the wound and over the anterior metatarsals worrisome for small abscess. Intense subcutaneous edema and enhancement are seen diffusely.  IMPRESSION: Status post transmetatarsal amputation. Findings consistent with osteomyelitis are seen in the distal metatarsals, most extensive in the first and  second as described above. Cellulitis about the foot. Skin ulceration over the distal first metatarsal is identified with a thin fluid collection tracking anteriorly along the stump worrisome for abscess. Small focus of subchondral edema in the lateral talus is likely degenerative. Electronically Signed By: Inge Rise M.D. On: 09/23/2019 15:25 Patient arrives in clinic today not complaining any of any pain. 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 She does not describe claudication however her activity is very limited. 10/01/2019; since the patient was last here I have communicated with Dr. Gwenlyn Found after bypass by Dr. Trula Slade and addressing the right superficial femoral artery he states that she is widely patent through peroneal arteries to the ankle with collaterals to the dorsalis pedis. He states he is going to talk to colleagues about the feasibility of tibial pedal access. The patient seems infectious disease later this afternoon Dr. Baxter Flattery. in preparation for this she has been off antibiotics for 1 week and I went ahead and obtain pieces of the remanence of her first metatarsal for pathology and CandS. The patient is a candidate for hyperbaric oxygen. 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 1/15; considerable improvement in the wounds on her feet. We are using silver collagen. She follows with Dr. Baxter Flattery of infectious disease and is on meropenem and daptomycin. She has been taught how to do this herself at home. Follows with Dr. Graylon Good at the end of treatment here. She has 2 wounds on the surgical TMA site 1 lateral and 1 medial the lateral 1 has regressed tremendously. The area medially still has some exposed bone although the base of this looks healthy. 1/22; 2 separate wounds on the surgical TMA site. Both of these look satisfactory. The medial area does not  have any exposed bone. This is an improvement On the left side fourth toe dorsally over the proximal phalanx there is a deep punched out area that probes to bone. She has an area on the tip of the left third toe. She also tells [place] that in HBO she traumatized her left Achilles and this is left her with a superficial wound area 1/28; weekly visit along with HBO. She has 5 wounds. T our punched out areas on the original TMA site on the right. Both of these appear to have contracted. o The area on the right no longer has exposed bone. She has an area on the tip of the left third toe and on the DIP of the left fourth toe. Both of these had surface callus that I removed and unfortunately they have small areas that both go to bone. She has a traumatic wound on the left Achilles area that happened in HBO and that appears better. She is tolerating her IV antibiotics well at home. She has home health changing the dressings and she is doing it once on the weekends. We have been using silver collagen. She has been extensively revascularized on the right by Dr. Trula Slade and subsequently by Dr. Gwenlyn Found. 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 Baxter Flattery of infectious disease on 2/17. Dr. Gwenlyn Found on 2/23 and according to her her  antibiotics stopped on 2/23. She is tolerating hyperbarics well. She has made a tremendous improvement in the right forefoot with only 2 smaller open wounds. Using silver collagen. On the left foot she has the area on the tip of the left third toe and the medial part of the left fourth toe. These had exposed bone last week I did not sense any of that today 2/12; sees Dr. Graylon Good on 2/17 and Dr. Gwenlyn Found on 2/23. We are going to use Dermagraft on this today however the lateral part of her train TMA incision on the right is healed and the medial part is down so much that  we just continue with the silver collagen She has wounds on the tip of her left third and the medial aspect of the left fourth. Both of these still have exposed bone I have not been able to get these to epithelialize. 2/19; she sees Dr. Gwenlyn Found on 2/23 and I have communicated with him about the left vascular supply. Looking at her angiogram from 08/03/2019 it looks as though that they could not cross the left CFA. Noted that she had one-vessel runoff bilaterally. She also apparently saw Dr. Graylon Good although I did not look up these results for preparation of this record. 2/26; Dr. Gwenlyn Found is going to do an angiogram next week on Wednesday. I think they are going to try to go at this both retrograde and anterograde to see if anything can be done to revascularize the left lower limb. She continues to make nice progress on the remaining wound on the right medial foot on her TMA site and the toe it wounds on the left are responding nicely as well 3/12; the patient underwent an extensive and complicated revascularization on the left leg by Dr. Fletcher Anon on 3/3; she had an atherectomy on occluded left popliteal artery; anterior tibial artery followed by a balloon angioplasty. Atherectomy was also performed to the left SFA because there was still significant 50% stenosis in the left popliteal artery they performed an intravascular lithotripsy which improved the residual stenosis to 20%. The same lithotripsy was used to dilate the proximal left SFA. She had a drug-eluting stent placed I believe in the SFA. The patient returned for hyperbarics this week. She has had some eye problems on the right which she tells me are secondary to diabetic retinopathy and she saw her eye doctor. She is really made excellent progress there is no open wounds on the left foot at all. She has one open area medially and her TMA site on the right lateral wound is closed. 3/19; the patient comes in today with the area on the medial transmet  on the right improved. Some debris removed from the surface revealed the still open wound. ooUnfortunately she now has the open area on the third toe tip and the medial aspect of the dorsal fourth toe. These are in the same location as her previous wounds. These had actually closed up last week. 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 She is undergone complex revascularizations on the right by Dr. Trula Slade and Dr. Gwenlyn Found and I believe Dr. 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 She completed IVantibiotics as directed by infectious disease and I  believe a course of oral antibiotics as well.she underwent a complete 40 treatments of hyperbarics. She comes in today with the vast majority of the right TMA site healed there is a small opening on the most medial aspect. Her left third and left fourth toes appear to have epithelialization although this has fluctuated somewhat. 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 She had a gaping wound on the right transmetatarsal amputation L as well as deep wounds on the left third and left fourth toes. She underwent complex revascularizations on her bilateral lower legs which I described on her last visit. She completed IV antibiotics by infectious disease and 40 treatments of hyperbarics. Her left third and fourth toes are healed. The area on the right transmetatarsal amputation site is also closed today. READMISSION 08/04/2020 [name] is now a 79 year old woman that we had for a complex stay in our clinic from October 2000 14 January 2020. 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 Her current problems with wounds began on October 30 when she developed a spontaneous opening over the right transmet medial aspect over the first metatarsal head. She has been using collagen on this that she had leftover from her last stay in this clinic. She also has had an open area on the medial right calf from a vein harvest site from her CABG she. She said that this is never really closed since the vein harvest was done. With regards to her arterial status this is a major issue. She had an angiogram by Dr. Gwenlyn Found in September 2020. She developed a gangrenous right first and second toes. Ultimately she was referred to Dr. Trula Slade who did a right common femoral endarterectomy with patch angioplasty. This was on 07/21/2019 and she really had a good result. Dr. Gwenlyn Found did a atherectomy followed by a drug coated balloon angioplasty of her right SFA popliteal and tibial peroneal trunk on 08/03/2019 again with excellent excellent results. Follow-up.Dopplers in November revealed revealed a widely patent SFA and tibioperoneal trunk. UNFORTUNATELY her recent angiogram that was done on 08/01/2020 showed an 80% right common femoral artery stenosis just above the previous endarterectomy site occluded right right SFA at the origin reconstituting the adductor canal by the profunda femoris collaterals. The profunda femoris is also diffusely diseased. There is and again another 80% tibioperoneal trunk stenosis and one-vessel runoff via the peroneal artery. She is seen Dr. Donzetta Matters and she is scheduled for a femoral endarterectomy and right femoral-popliteal bypass grafting with endarterectomy of her tibial peroneal trunk. The scheduled surgery is on 11/23 The patient does not complain of pain at rest. She does have 5-minute walking claudication. The wounds on her right first met head remanent was spontaneous she does not think she hit this. As mentioned in  the area on her right medial calf is a vein harvest site Dr. Gwenlyn Found had texted me about getting her in the clinic and we have arranged this as soon as we can. I have told her that we will have to see how successful Dr. Donzetta Matters is before we know what can be done about the wounds in a precise fashion. Fortunately there does not seem to be any obvious infection involving the right foot although that may need to be looked at if things really deteriorate. She was treated with IV antibiotics and hyperbaric  oxygen for her previous osteomyelitis 12/2; the patient underwent a right femoral endarterectomy and bypass femoral to popliteal artery. This was done on 08/16/2020. She is done remarkably well. She has a small area on the right anterior tibia and a small area in the medial part of her original right transmetatarsal amputation. She appears to have had an excellent response to surgery. 12/16; the patient's area on the right tibia and the small area on the medial part of her original right transmetatarsal amputation is totally healed Objective Constitutional Sitting or standing Blood Pressure is within target range for patient.. Pulse regular and within target range for patient.Marland Kitchen Respirations regular, non-labored and within target range.. Temperature is normal and within the target range for the patient.Marland Kitchen Appears in no distress. Vitals Time Taken: 12:55 PM, Temperature: 98 F, Pulse: 84 bpm, Respiratory Rate: 18 breaths/min, Blood Pressure: 117/68 mmHg. General Notes: Wound exam ooOver the right first metatarsal head the area is closed on the right anterior upper tibia this is also closed fully epithelialized. She has edema in the right leg which is likely postsurgical. There is no warmth no calf tenderness. Pedal pulses are difficult to feel but I think they are palpable at the both dorsalis pedis and posterior tibial on the right Integumentary (Hair, Skin) Wound #11 status is Open. Original cause of  wound was Gradually Appeared. The wound is located on the Right,Medial Amputation Site - Transmetatarsal. The wound measures 0cm length x 0cm width x 0cm depth; 0cm^2 area and 0cm^3 volume. There is no tunneling or undermining noted. There is a none present amount of drainage noted. The wound margin is flat and intact. There is no granulation within the wound bed. There is no necrotic tissue within the wound bed. Wound #12 status is Open. Original cause of wound was Surgical Injury. The wound is located on the Right,Medial Lower Leg. The wound measures 0cm length x 0cm width x 0cm depth; 0cm^2 area and 0cm^3 volume. There is no tunneling or undermining noted. There is a none present amount of drainage noted. The wound margin is flat and intact. There is no granulation within the wound bed. There is no necrotic tissue within the wound bed. 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 Edema Control - Lymphedema / SCD / Other: Elevate legs to the level of the heart or above for 30 minutes daily and/or when sitting, a frequency of: - 3-4 times a day throughout the day. Avoid standing for long periods of time. Moisturize legs daily. Other Edema Control Orders/Instructions: - Patient to start wearing support stockings. apply in the morning and remove at night. Home Health: [facility] for wound care. - [facility] discharged from wound care standpoint. 1. The patient can be discharged 2. I have recommended support stockings on her legs just to protect her skin. She has significant PAD. Any wound will be difficult  to heal. Also skin moisturizer bilaterally Electronic Signature(s) Signed: 09/09/2020 8:01:39 AM By: [name] MD Entered By: [name] on 09/08/2020 13:40:27 -------------------------------------------------------------------------------- SuperBill Details Patient Name: Date of Service: [name], [place]. 09/08/2020 Medical Record Number: [id number] Patient Account Number: [id number] Date of Birth/Sex: Treating  RN: [id number] (79 y.o. [name], [name] Primary Care Provider: [name] Other Clinician: Referring Provider: Treating Provider/[name]: Tressie Stalker in Treatment: 5 Diagnosis Coding ICD-10 Codes Code Description E11.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 E11.621 Type 2 diabetes mellitus with foot ulcer L97.518 Non-pressure chronic ulcer of other part of right foot with other specified severity L97.218 Non-pressure chronic ulcer of right calf with other specified severity Facility Procedures CPT4 Code: [id number] Description: 99213 - WOUND CARE VISIT-LEV 3 EST PT Modifier: Quantity: 1 Physician Procedures : CPT4 Code Description Modifier [id number] - WC PHYS LEVEL 2 - EST PT ICD-10 Diagnosis Description L97.518 Non-pressure chronic ulcer of other part of right foot with other specified severity L97.218 Non-pressure chronic ulcer of right calf with  other specified severity E11.621 Type 2 diabetes mellitus with foot ulcer Quantity: 1 Electronic Signature(s) Signed: 09/09/2020 8:01:39 AM By: [name] MD Entered By: [name] on 09/08/2020 13:41:40

## 2020-09-09 NOTE — Progress Notes (Signed)
[name], [name] ([id number]) Visit Report for 09/08/2020 Arrival Information Details Patient Name: Date of Service: [name] 09/08/2020 12:30 PM Medical Record Number: [id number] Patient Account Number: [id number] Date of Birth/Sex: Treating RN: [birth year] (79 y.o. [name] Primary Care Reace Breshears: [name] Other Clinician: Referring Eilee Schader: Treating Royalty Domagala/[name]: Tressie Stalker in Treatment: 5 Visit Information History Since Last Visit All ordered tests and consults were completed: No Patient Arrived: Ambulatory Added or deleted any medications: No Arrival Time: 12:54 Any new allergies or adverse reactions: No Accompanied By: self Had a fall or experienced change in No Transfer Assistance: None activities of daily living that may affect Patient Identification Verified: Yes risk of falls: Secondary Verification Process Completed: Yes Signs or symptoms of abuse/neglect since last visito No Patient Has Alerts: Yes Hospitalized since last visit: No Patient Alerts: R ABI: 0.98 Implantable device outside of the clinic excluding No cellular tissue based products placed in the center since last visit: Has Dressing in Place as Prescribed: Yes Pain Present Now: No Electronic Signature(s) Signed: 09/09/2020 5:23:39 PM By: Carlene Coria RN Entered By: Carlene Coria on 09/08/2020 12:55:13 -------------------------------------------------------------------------------- Clinic Level of Care Assessment Details Patient Name: Date of Service: [name] 09/08/2020 12:30 PM Medical Record Number: [id number] Patient Account Number: [id number] Date of Birth/Sex: Treating RN: 14-May-1941 (79 y.o. [name], [name] Primary Care Brayen Bunn: [name] Other Clinician: Referring Nicholaos Schippers: Treating Yasuo Phimmasone/[name]: Tressie Stalker in Treatment: 5 Clinic Level of Care Assessment Items TOOL 4 Quantity  Score X- 1 0 Use when only an EandM is performed on FOLLOW-UP visit ASSESSMENTS - Nursing Assessment / Reassessment X- 1 10 Reassessment of Co-morbidities (includes updates in patient status) X- 1 5 Reassessment of Adherence to Treatment Plan ASSESSMENTS - Wound and Skin A ssessment / Reassessment X - Simple Wound Assessment / Reassessment - one wound 1 5 []  - 0 Complex Wound Assessment / Reassessment - multiple wounds X- 1 10 Dermatologic / Skin Assessment (not related to wound area) ASSESSMENTS - Focused Assessment X- 1 5 Circumferential Edema Measurements - multi extremities X- 1 10 Nutritional Assessment / Counseling / Intervention []  - 0 Lower Extremity Assessment (monofilament, tuning fork, pulses) []  - 0 Peripheral Arterial Disease Assessment (using hand held doppler) ASSESSMENTS - Ostomy and/or Continence Assessment and Care []  - 0 Incontinence Assessment and Management []  - 0 Ostomy Care Assessment and Management (repouching, etc.) PROCESS - Coordination of Care X - Simple Patient / Family Education for ongoing care 1 15 []  - 0 Complex (extensive) Patient / Family Education for ongoing care X- 1 10 Staff obtains Programmer, systems, Records, T Results / Process Orders est X- 1 10 Staff telephones HHA, Nursing Homes / Clarify orders / etc []  - 0 Routine Transfer to another Facility (non-emergent condition) []  - 0 Routine Hospital Admission (non-emergent condition) []  - 0 New Admissions / Biomedical engineer / Ordering NPWT Apligraf, etc. , []  - 0 Emergency Hospital Admission (emergent condition) X- 1 10 Simple Discharge Coordination []  - 0 Complex (extensive) Discharge Coordination PROCESS - Special Needs []  - 0 Pediatric / Minor Patient Management []  - 0 Isolation Patient Management []  - 0 Hearing / Language / Visual special needs []  - 0 Assessment of Community assistance (transportation, D/C planning, etc.) []  - 0 Additional assistance / Altered  mentation []  - 0 Support Surface(s) Assessment (bed, cushion, seat, etc.) INTERVENTIONS - Wound Cleansing / Measurement X - Simple Wound Cleansing - one wound 1 5 []  -  0 Complex Wound Cleansing - multiple wounds X- 1 5 Wound Imaging (photographs - any number of wounds) []  - 0 Wound Tracing (instead of photographs) X- 1 5 Simple Wound Measurement - one wound []  - 0 Complex Wound Measurement - multiple wounds INTERVENTIONS - Wound Dressings []  - 0 Small Wound Dressing one or multiple wounds []  - 0 Medium Wound Dressing one or multiple wounds []  - 0 Large Wound Dressing one or multiple wounds []  - 0 Application of Medications - topical []  - 0 Application of Medications - injection INTERVENTIONS - Miscellaneous []  - 0 External ear exam []  - 0 Specimen Collection (cultures, biopsies, blood, body fluids, etc.) []  - 0 Specimen(s) / Culture(s) sent or taken to Lab for analysis []  - 0 Patient Transfer (multiple staff / Civil Service fast streamer / Similar devices) []  - 0 Simple Staple / Suture removal (25 or less) []  - 0 Complex Staple / Suture removal (26 or more) []  - 0 Hypo / Hyperglycemic Management (close monitor of Blood Glucose) []  - 0 Ankle / Brachial Index (ABI) - do not check if billed separately X- 1 5 Vital Signs Has the patient been seen at the hospital within the last three years: Yes Total Score: 110 Level Of Care: New/Established - Level 3 Electronic Signature(s) Signed: 09/08/2020 5:23:27 PM By: Deon Pilling Entered By: Deon Pilling on 09/08/2020 13:27:31 -------------------------------------------------------------------------------- Encounter Discharge Information Details Patient Name: Date of Service: [name], [name] 09/08/2020 12:30 PM Medical Record Number: [id number] Patient Account Number: [id number] Date of Birth/Sex: Treating RN: [birth year] (79 y.o. [name] Primary Care Yari Szeliga: [name] Other Clinician: Referring Gwenn Teodoro: Treating  Rachyl Wuebker/[name]: Tressie Stalker in Treatment: 5 Encounter Discharge Information Items Discharge Condition: Stable Ambulatory Status: Ambulatory Discharge Destination: Home Transportation: Private Auto Accompanied By: self Schedule Follow-up Appointment: Yes Clinical Summary of Care: Electronic Signature(s) Signed: 09/08/2020 5:23:27 PM By: Deon Pilling Entered By: Deon Pilling on 09/08/2020 13:28:23 -------------------------------------------------------------------------------- Lower Extremity Assessment Details Patient Name: Date of Service: [name] 09/08/2020 12:30 PM Medical Record Number: [id number] Patient Account Number: [id number] Date of Birth/Sex: Treating RN: 1941-06-25 (79 y.o. [name] Primary Care Kimisha Eunice: [name] Other Clinician: Referring Trask Vosler: Treating Tahara Ruffini/[name]: Tressie Stalker in Treatment: 5 Edema Assessment Assessed: Shirlyn Goltz: No] [Right: No] Edema: [Left: N] [Right: o] Calf Left: Right: Point of Measurement: 28 cm From Medial Instep 31 cm Ankle Left: Right: Point of Measurement: 9 cm From Medial Instep 20 cm Knee To Floor Left: Right: From Medial Instep 43 cm Electronic Signature(s) Signed: 09/08/2020 5:23:27 PM By: Deon Pilling Signed: 09/09/2020 5:23:39 PM By: Carlene Coria RN Entered By: Deon Pilling on 09/08/2020 13:11:47 -------------------------------------------------------------------------------- Multi Wound Chart Details Patient Name: Date of Service: [name], [name] 09/08/2020 12:30 PM Medical Record Number: [id number] Patient Account Number: [id number] Date of Birth/Sex: Treating RN: 01/12/1941 (79 y.o. 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] [N/A:N/A] 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] [12:N/A] [N/A:N/A] Secondary Etiology: [11:Coronary Artery Disease, Deep Vein] [12:Coronary Artery Disease, Deep Vein] [N/A:N/A] Comorbid History: [11:Thrombosis, Hypertension, Myocardial Infarction, Peripheral Arterial  Disease, Type II Diabetes 07/23/2020] [12:Thrombosis, Hypertension, Myocardial Infarction, Peripheral Arterial Disease, Type II Diabetes 03/24/2020] [N/A:N/A] Date Acquired: [11:5] [12:5] [N/A:N/A] Weeks of Treatment: [11:Open] [12:Open] [N/A:N/A] Wound Status: [11:0x0x0] [12:0x0x0] [N/A:N/A] Measurements L x W x D (cm) [11:0] [12:0] [N/A:N/A] A (cm) : rea [11:0] [12:0] [N/A:N/A] Volume (cm) : [11:100.00%] [12:100.00%] [N/A:N/A] % Reduction in A rea: [11:100.00%] [12:100.00%] [N/A:N/A] % Reduction in Volume: [11:Grade 2] [12:Unclassifiable] [N/A:N/A] Classification: [11:None Present] [12:None Present] [N/A:N/A] Exudate A mount: [11:Flat and Intact] [12:Flat and Intact] [N/A:N/A] Wound Margin: [11:None Present (0%)] [12:None Present (0%)] [N/A:N/A] Granulation A mount: [11:None Present (0%)] [12:None Present (0%)] [N/A:N/A] Necrotic A mount: [11:Fascia: No] [12:Fascia: No] [N/A:N/A] Exposed Structures: [11:Fat Layer (Subcutaneous Tissue): No Tendon: No Muscle: No Joint: No Bone: No Large (67-100%)] [12:Fat Layer (Subcutaneous Tissue): No Tendon: No Muscle: No Joint: No Bone: No Large (67-100%)] [N/A:N/A] Treatment Notes Electronic Signature(s) Signed: 09/08/2020 5:23:27 PM By: Deon Pilling Signed:  09/09/2020 8:01:39 AM By: [name] MD Entered By: [name] on 09/08/2020 13:37:02 -------------------------------------------------------------------------------- Multi-Disciplinary Care Plan Details Patient Name: Date of Service: [name], [name] 09/08/2020 12:30 PM Medical Record Number: [id number] Patient Account Number: [id number] Date of Birth/Sex: Treating RN: [birth year] (79 y.o. [name] Primary Care Mika Anastasi: [name] Other Clinician: Referring Juanita Devincent: Treating Cypher Paule/[name]: Tressie Stalker in Treatment: 5 Active Inactive Electronic Signature(s) Signed: 09/08/2020 5:23:27 PM By: Deon Pilling Entered By: Deon Pilling on 09/08/2020 13:46:38 -------------------------------------------------------------------------------- Pain Assessment Details Patient Name: Date of Service: [name] 09/08/2020 12:30 PM Medical Record Number: [id number] Patient Account Number: [id number] Date of Birth/Sex: Treating RN: Jan 15, 1941 (79 y.o. [name] Primary Care Shekira Drummer: [name] Other Clinician: Referring Coren Crownover: Treating Kimbra Marcelino/[name]: Tressie Stalker in Treatment: 5 Active Problems Location of Pain Severity and Description of Pain Patient Has Paino No Site Locations Pain Management and Medication Current Pain Management: Electronic Signature(s) Signed: 09/09/2020 5:23:39 PM By: Carlene Coria RN Entered By: Carlene Coria on 09/08/2020 12:55:45 -------------------------------------------------------------------------------- Patient/Caregiver Education Details Patient Name: Date of Service: [name] 12/16/2021andnbsp12:30 PM Medical Record Number: [id number] Patient Account Number: [id number] Date of Birth/Gender: Treating RN: 05-Oct-1940 (79 y.o. [name] Primary Care Physician: [name] Other Clinician: Referring  Physician: Treating Physician/[name]: Tressie Stalker in Treatment: 5 Education Assessment Education Provided To: Patient Education Topics Provided Elevated Blood Sugar/ Impact on Healing: Handouts: Elevated Blood Sugars: How Do They Affect Wound Healing Methods: Explain/Verbal Responses: Reinforcements needed Electronic Signature(s) Signed: 09/08/2020 5:23:27 PM By: Deon Pilling Entered By: Deon Pilling on 09/08/2020 12:56:17 -------------------------------------------------------------------------------- Wound Assessment Details Patient Name: Date of Service: [name] 09/08/2020 12:30 PM Medical Record Number: [id number] Patient Account Number: [id number] Date of Birth/Sex: Treating RN: 1941/05/24 (79 y.o. 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 Wounding Event: Gradually Appeared Etiology: Date Acquired: 07/23/2020 Wound Open Weeks Of Treatment: 5 Status: Clustered Wound: No Comorbid Coronary Artery Disease, Deep Vein Thrombosis, Hypertension, History: Myocardial Infarction, Peripheral Arterial Disease, Type II Diabetes Photos Photo Uploaded By: [name] on 09/09/2020 15:05:56 Wound Measurements Length: (cm) Width: (cm) Depth: (cm) Area: (cm) Volume: (cm) 0 % Reduction in Area: 100% 0 % Reduction in Volume: 100% 0 Epithelialization: Large (67-100%) 0 Tunneling: No  0 Undermining: No Wound Description Classification: Grade 2 Wound Margin: Flat and Intact Exudate Amount: None Present Foul Odor After Cleansing: No Slough/Fibrino No Wound Bed Granulation Amount: None Present (0%)  Exposed Structure Necrotic Amount: None Present (0%) Fascia Exposed: No Fat Layer (Subcutaneous Tissue) Exposed: No Tendon Exposed: No Muscle Exposed: No Joint Exposed: No Bone Exposed: No Electronic Signature(s) Signed: 09/09/2020 5:23:39 PM By: Carlene Coria RN Entered By: Carlene Coria on 09/08/2020 13:02:33 -------------------------------------------------------------------------------- Wound Assessment Details Patient Name: Date of Service: [name] 09/08/2020 12:30 PM Medical Record Number: [id number] Patient Account Number: [id number] Date of Birth/Sex: Treating RN: 02-19-1941 (79 y.o. 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, Medial Lower Leg Wound Open Wounding Event: Surgical Injury Status: Date Acquired: 03/24/2020 Comorbid Coronary Artery Disease, Deep Vein Thrombosis, Hypertension, Weeks Of Treatment: 5 History: Myocardial Infarction, Peripheral Arterial Disease, Type II Diabetes Clustered Wound: No Photos Photo Uploaded By: [name] on 09/09/2020 15:05:56 Wound Measurements Length: (cm) Width: (cm) Depth: (cm) Area: (cm) Volume: (cm) 0 % Reduction in Area: 100% 0 % Reduction in Volume: 100% 0 Epithelialization: Large (67-100%) 0 Tunneling: No 0 Undermining: No Wound Description Classification: Unclassifiable Wound Margin: Flat and Intact Exudate Amount: None Present Foul Odor After Cleansing: No Slough/Fibrino No Wound Bed Granulation Amount: None Present (0%) Exposed Structure Necrotic Amount: None Present (0%) Fascia Exposed: No Fat Layer (Subcutaneous Tissue) Exposed: No Tendon Exposed: No Muscle Exposed: No Joint Exposed: No Bone Exposed: No Electronic Signature(s) Signed: 09/09/2020  5:23:39 PM By: Carlene Coria RN Entered By: Carlene Coria on 09/08/2020 13:03:20 -------------------------------------------------------------------------------- Vitals Details Patient Name: Date of Service: [name], [name] 09/08/2020 12:30 PM Medical Record Number: [id number] Patient Account Number: [id number] Date of Birth/Sex: Treating RN: 06-21-1941 (79 y.o. [name] Primary Care Girtie Wiersma: [name] Other Clinician: Referring Rickey Sadowski: Treating Faaris Arizpe/[name]: Tressie Stalker in Treatment: 5 Vital Signs Time Taken: 12:55 Temperature (F): 98 Pulse (bpm): 84 Respiratory Rate (breaths/min): 18 Blood Pressure (mmHg): 117/68 Reference Range: 80 - 120 mg / dl Electronic Signature(s) Signed: 09/09/2020 5:23:39 PM By: Carlene Coria RN Entered By: Carlene Coria on 09/08/2020 12:55:37

## 2020-09-13 DIAGNOSIS — T8789 Other complications of amputation stump: Secondary | ICD-10-CM | POA: Diagnosis not present

## 2020-09-13 DIAGNOSIS — I2510 Atherosclerotic heart disease of native coronary artery without angina pectoris: Secondary | ICD-10-CM | POA: Diagnosis not present

## 2020-09-13 DIAGNOSIS — Z48812 Encounter for surgical aftercare following surgery on the circulatory system: Secondary | ICD-10-CM | POA: Diagnosis not present

## 2020-09-13 DIAGNOSIS — E11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: Secondary | ICD-10-CM | POA: Diagnosis not present

## 2020-09-13 DIAGNOSIS — I70213 Atherosclerosis of native arteries of extremities with intermittent claudication, bilateral legs: Secondary | ICD-10-CM | POA: Diagnosis not present

## 2020-09-13 DIAGNOSIS — E113493 Type 2 diabetes mellitus with severe nonproliferative diabetic retinopathy without macular edema, bilateral: Secondary | ICD-10-CM | POA: Diagnosis not present

## 2020-09-14 ENCOUNTER — Ambulatory Visit (INDEPENDENT_AMBULATORY_CARE_PROVIDER_SITE_OTHER): Payer: Medicare Other | Admitting: Ophthalmology

## 2020-09-14 ENCOUNTER — Other Ambulatory Visit: Payer: Self-pay

## 2020-09-14 ENCOUNTER — Encounter (INDEPENDENT_AMBULATORY_CARE_PROVIDER_SITE_OTHER): Payer: Self-pay | Admitting: Ophthalmology

## 2020-09-14 DIAGNOSIS — H47011 Ischemic optic neuropathy, right eye: Secondary | ICD-10-CM | POA: Diagnosis not present

## 2020-09-14 DIAGNOSIS — E113493 Type 2 diabetes mellitus with severe nonproliferative diabetic retinopathy without macular edema, bilateral: Secondary | ICD-10-CM

## 2020-09-14 DIAGNOSIS — E113491 Type 2 diabetes mellitus with severe nonproliferative diabetic retinopathy without macular edema, right eye: Secondary | ICD-10-CM

## 2020-09-14 DIAGNOSIS — I6523 Occlusion and stenosis of bilateral carotid arteries: Secondary | ICD-10-CM

## 2020-09-14 NOTE — Progress Notes (Signed)
09/14/2020     CHIEF COMPLAINT Patient presents for Retina Follow Up (4 MO FU OU, PRP OD///Pt reports stable vision OU, no new F/F OU, no pain or pressure OU////Last A1C: 6.2  taken 08/2020///Last BS: 105 taken this AM )   HISTORY OF PRESENT ILLNESS: [name] is a 79 y.o. female who presents to the clinic today for:   HPI    Retina Follow Up    Patient presents with  Other.  In right eye.  This started 4 months ago.  Duration of 4 months.  Since onset it is stable. Additional comments: 4 MO FU OU, PRP OD   Pt reports stable vision OU, no new F/F OU, no pain or pressure OU    Last A1C: 6.2  taken 08/2020   Last BS: 105 taken this AM        Last edited by Nichola Sizer D on 09/14/2020  9:51 AM. (History)      Referring physician: [name], MD [place],  [place] [address]  HISTORICAL INFORMATION:   Selected notes from the MEDICAL RECORD NUMBER    Lab Results  Component Value Date   HGBA1C 6.5 08/30/2020     CURRENT MEDICATIONS: Current Outpatient Medications (Ophthalmic Drugs)  Medication Sig  . Polyethyl Glycol-Propyl Glycol (SYSTANE OP) Place 1 drop into both eyes 2 (two) times daily as needed (Dry eyes).   No current facility-administered medications for this visit. (Ophthalmic Drugs)   Current Outpatient Medications (Other)  Medication Sig  . acetaminophen (TYLENOL) 500 MG tablet Take 1,000 mg by mouth every 6 (six) hours as needed for moderate pain or headache.  . Ascorbic Acid (VITAMIN C) 100 MG tablet Take 100 mg by mouth daily with supper.   Marland Kitchen aspirin EC 81 MG tablet Take 81 mg by mouth at bedtime.   Marland Kitchen atorvastatin (LIPITOR) 80 MG tablet Take 1 tablet (80 mg total) by mouth daily. (Patient taking differently: Take 80 mg by mouth at bedtime.)  . calcium carbonate (TUMS - DOSED IN MG ELEMENTAL CALCIUM) 500 MG chewable tablet Chew 2 tablets by mouth daily as needed for indigestion or heartburn.   . canagliflozin  (INVOKANA) 300 MG TABS tablet TAKE 1 TABLET BY MOUTH BEFORE BREAKFAST (Patient taking differently: Take 300 mg by mouth daily before breakfast.)  . CINNAMON PO Take 2 tablets by mouth in the morning, at noon, and at bedtime.   . clopidogrel (PLAVIX) 75 MG tablet Take 1 tablet by mouth once daily (Patient taking differently: Take 75 mg by mouth daily.)  . ferrous sulfate 325 (65 FE) MG tablet Take 1 tablet (325 mg total) by mouth daily.  Marland Kitchen glipiZIDE (GLUCOTROL XL) 5 MG 24 hr tablet Take 1 tablet by mouth once daily with breakfast (Patient taking differently: Take 5 mg by mouth daily with breakfast.)  . hydrochlorothiazide (HYDRODIURIL) 12.5 MG tablet Take 1 tablet (12.5 mg total) by mouth daily.  . metFORMIN (GLUCOPHAGE) 1000 MG tablet Take 1 tablet by mouth twice daily  . metoprolol tartrate (LOPRESSOR) 25 MG tablet Take 1.5 tablets (37.5 mg total) by mouth 2 (two) times daily.  Marland Kitchen oxyCODONE-acetaminophen (PERCOCET/ROXICET) 5-325 MG tablet Take 1-2 tablets by mouth every 4 (four) hours as needed for moderate pain.  . pantoprazole (PROTONIX) 40 MG tablet Take 1 tablet (40 mg total) by mouth daily. (Patient taking differently: Take 40 mg by mouth every Monday, Wednesday, and Friday at 8 PM.)  . Probiotic Product ([employer] SENIOR PROBIOTIC  PO) Take 1 capsule by mouth daily with breakfast.  . pyridoxine (B-6) 100 MG tablet Take 100 mg by mouth every evening.   . senna-docusate (SENOKOT-S) 8.6-50 MG tablet Take 1 tablet by mouth daily.  Marland Kitchen telmisartan (MICARDIS) 80 MG tablet Take 1 tablet by mouth once daily (Patient taking differently: Take 80 mg by mouth daily with breakfast.)  . vitamin B-12 (CYANOCOBALAMIN) 500 MCG tablet Take 500 mcg by mouth daily.   Current Facility-Administered Medications (Other)  Medication Route  . sodium chloride flush (NS) 0.9 % injection 3 mL Intravenous      REVIEW OF SYSTEMS:    ALLERGIES Allergies  Allergen Reactions  . Bactrim [Sulfamethoxazole-Trimethoprim] Other  (See Comments)    ^ K+( elevated)   . Demerol [Meperidine]     Delusional   . Scopolamine     Delusional   . Versed [Midazolam] Anxiety    Frantic, out of my mind, agitated     PAST MEDICAL HISTORY Past Medical History:  Diagnosis Date  . 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 Arthritis   . Cancer ([place])    removal from nose - MOSE procedure   . Complication of anesthesia    VERSED- agitated, muscle spasms, "jerking" , frantic , (never had this occurence in the pas)   . Coronary artery disease   . Diabetes mellitus without complication ([place])   . Dysrhythmia    PVC's  . GERD (gastroesophageal reflux disease)   . Heart murmur   . History of hiatal hernia   . Hyperlipidemia   . Hypertension 11/20/11   ECHO- EF>55% Borderline concentric left ventricular hypertrophy. There is a small calcified mass in the L:A near the LA appendage. No valvular masses seen with associated mitral annular calcification. LA Volume/ BSA27.4 ml/m2 No AS. Right ventricular systolic pressure is elevated at 2mmHg.  . Myocardial infarction [facility])    June 2021  . Neuromuscular disorder (HCC)    neuropathy in bilateral feet  . Peripheral vascular disease ([place])   . Pneumonia    not hosp.   . 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  PTCAs with drug coated balloon Lt ant tibial artery and Lt popliteal artery  11/26/2019   Past Surgical History:  Procedure Laterality Date  . ABDOMINAL AORTAGRAM  11/25/2019   ABDOMINAL AORTOGRAM W/LOWER EXTREMITY   . ABDOMINAL AORTOGRAM N/A 06/15/2019   Procedure: ABDOMINAL AORTOGRAM;  Surgeon: [name], MD;  Location: [place] [facility];  Service: Cardiovascular;  Laterality: N/A;  . ABDOMINAL AORTOGRAM W/LOWER EXTREMITY N/A 11/25/2019   Procedure: ABDOMINAL AORTOGRAM W/LOWER EXTREMITY;  Surgeon: [name], MD;  Location: [place] [facility];  Service: Cardiovascular;  Laterality: N/A;  Lt leg  . ABDOMINAL AORTOGRAM W/LOWER EXTREMITY N/A 08/01/2020    Procedure: ABDOMINAL AORTOGRAM W/LOWER EXTREMITY;  Surgeon: [name], MD;  Location: [place] [facility];  Service: Cardiovascular;  Laterality: N/A;  . APPENDECTOMY    . APPLICATION OF WOUND VAC Right 07/21/2019   Procedure: Application Of Prevena Wound Vac Right Groin;  Surgeon: [name], MD;  Location: [place];  Service: Vascular;  Laterality: Right;  . CARDIAC CATHETERIZATION    . CARPAL TUNNEL RELEASE    . CARPAL TUNNEL RELEASE    . CESAREAN SECTION     x 2  . COLONOSCOPY    . CORONARY ARTERY BYPASS GRAFT N/A 03/24/2020   Procedure: CORONARY ARTERY BYPASS GRAFTING (CABG) using LIMA to LAD (  m); RIMA to RAMUS; Endoscopic Right Greater Saphenous Vein: SVG to Diag1; SVG to PLB (right); and SVG to PL (left).;  Surgeon: [name], MD;  Location: [place];  Service: Open Heart Surgery;  Laterality: N/A;  BILATERAL IMA  . ENDARTERECTOMY FEMORAL Right 07/21/2019   Procedure: RIGHT ENDARTERECTOMY FEMORAL WITH PATCH ANGIOPLASTY;  Surgeon: [name], MD;  Location: [place];  Service: Vascular;  Laterality: Right;  . ENDARTERECTOMY FEMORAL Right 08/16/2020   Procedure: RIGHT FEMORAL ENDARTERECTOMY;  Surgeon: [name], MD;  Location: [facility] OR;  Service: Vascular;  Laterality: Right;  . ENDOVEIN HARVEST OF GREATER SAPHENOUS VEIN Right 03/24/2020   Procedure: ENDOVEIN HARVEST OF [place];  Surgeon: [name], MD;  Location: [place];  Service: Open Heart Surgery;  Laterality: Right;  . EYE SURGERY     cataract removal bilaterally  . FEMORAL-POPLITEAL BYPASS GRAFT Right 08/16/2020   Procedure: BYPASS GRAFT FEMORAL-POPLITEAL ARTERY;  Surgeon: [name], MD;  Location: [place];  Service: Vascular;  Laterality: Right;  . INTRAVASCULAR LITHOTRIPSY  11/25/2019   Procedure: INTRAVASCULAR LITHOTRIPSY;  Surgeon: [name], MD;  Location: [place] [facility];  Service: Cardiovascular;;  LT. SFA  . LEFT HEART CATH AND CORONARY ANGIOGRAPHY N/A  03/22/2020   Procedure: LEFT HEART CATH AND CORONARY ANGIOGRAPHY;  Surgeon: Martinique, Peter M, MD;  Location: [place] [facility];  Service: Cardiovascular;  Laterality: N/A;  . LOWER EXTREMITY ANGIOGRAPHY Bilateral 06/15/2019   Procedure: Lower Extremity Angiography;  Surgeon: [name], MD;  Location: [place] [facility];  Service: Cardiovascular;  Laterality: Bilateral;  . LOWER EXTREMITY ANGIOGRAPHY Right 08/03/2019   Procedure: LOWER EXTREMITY ANGIOGRAPHY;  Surgeon: [name], MD;  Location: [place] [facility];  Service: Cardiovascular;  Laterality: Right;  . PATCH ANGIOPLASTY Right 07/21/2019   Procedure: Patch Angioplasty Right Femoral Artery;  Surgeon: [name], MD;  Location: [place];  Service: Vascular;  Laterality: Right;  . PERIPHERAL VASCULAR ATHERECTOMY  08/03/2019   Procedure: PERIPHERAL VASCULAR ATHERECTOMY;  Surgeon: [name], MD;  Location: [place] [facility];  Service: Cardiovascular;;  right SFA, right TP trunk  . PERIPHERAL VASCULAR ATHERECTOMY  11/25/2019   Procedure: PERIPHERAL VASCULAR ATHERECTOMY;  Surgeon: [name], MD;  Location: [place] [facility];  Service: Cardiovascular;;  Lt.  POPLITEAL and AT  . PERIPHERAL VASCULAR BALLOON ANGIOPLASTY Left 06/15/2019   Procedure: PERIPHERAL VASCULAR BALLOON ANGIOPLASTY;  Surgeon: [name], MD;  Location: [place] [facility];  Service: Cardiovascular;  Laterality: Left;  SFA UNSUCCESSFUL UNABLE TO CROSS LESION  . PERIPHERAL VASCULAR BALLOON ANGIOPLASTY  08/03/2019   Procedure: PERIPHERAL VASCULAR BALLOON ANGIOPLASTY;  Surgeon: [name], MD;  Location: [place] [facility];  Service: Cardiovascular;;  right SFA, Right TP trunk  . TEE WITHOUT CARDIOVERSION N/A 03/24/2020   Procedure: TRANSESOPHAGEAL ECHOCARDIOGRAM (TEE);  Surgeon: [name], MD;  Location: [place];  Service: Open Heart Surgery;  Laterality: N/A;  . TONSILLECTOMY     and adenoidectomy  . 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 TUBAL LIGATION      FAMILY HISTORY Family History  Problem Relation Age of Onset  . Cancer - Prostate Father   . Cancer - Colon Father   . Stroke Mother   . Hypertension Mother   . Hyperlipidemia Mother   .  Melanoma Brother     SOCIAL HISTORY Social History   Tobacco Use  . Smoking status: Former Smoker    Quit date: 03/31/1972    Years since quitting: 48.4  . Smokeless tobacco: Never Used  Vaping Use  . Vaping Use: Never used  Substance Use Topics  . Alcohol use: No  . Drug use: No         OPHTHALMIC EXAM:  Base Eye Exam    Visual Acuity (ETDRS)      Right Left   Dist [place] 20/25 -1 20/40 +1   Dist ph [place]  20/25       Tonometry (Tonopen, 9:58 AM)      Right Left   Pressure 14 14       Pupils      Dark Light Shape React APD   Right 3 2 Round Brisk None   Left 3 2 Round Brisk None       Visual Fields (Counting fingers)      Left Right    Full Full       Extraocular Movement      Right Left    Full Full       Neuro/Psych    Oriented x3: Yes   Mood/Affect: Normal       Dilation    Right eye: 1.0% Mydriacyl, 2.5% Phenylephrine @ 9:58 AM        Slit Lamp and Fundus Exam    External Exam      Right Left   External Normal Normal       Slit Lamp Exam      Right Left   Lids/Lashes Normal Normal   Conjunctiva/Sclera White and quiet White and quiet   Cornea Clear Clear   Anterior Chamber Deep and quiet Deep and quiet   Iris Round and reactive Round and reactive   Lens Centered posterior chamber intraocular lens Centered posterior chamber intraocular lens   Anterior Vitreous Normal Normal       Fundus Exam      Right Left   Posterior Vitreous Posterior vitreous detachment    Disc 1+ Pallor temporal    C/D Ratio 0.0    Macula no macular thickening, Microaneurysms, no clinically significant  macular edema    Vessels NPDR-Severe    Periphery Dot-blot hemorrhage           IMAGING AND PROCEDURES  Imaging and Procedures for 09/14/20  Color Fundus Photography Optos - OU - Both Eyes       Right Eye Progression has improved. Disc findings include increased cup to disc ratio, pallor. Macula : normal observations.   Left Eye Progression has improved. Disc findings include pallor. Macula : normal observations. Periphery : normal observations.   Notes Large dot blot hemorrhages anterior retina inferiorly and temporally region of nonperfusion with severe NPDR OD  OS moderate NPDR       Panretinal Photocoagulation - OD - Right Eye       Time Out Confirmed correct patient, procedure, site, and patient consented.   Anesthesia Topical anesthesia was used. Anesthetic medications included Proparacaine 0.5%.   Laser Information The type of laser was diode. Color was yellow. The duration in seconds was 0.02. The spot size was 390 microns. Laser power was 300. Total spots was 446.   Post-op The patient tolerated the procedure well. There were no complications. The patient received written and verbal post procedure care education.   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      ICD-10-CM   1. 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  After much discussion, will deliver sector PRP in  the area of large dot blot hemorrhages, retinal capillary nonperfusion temporally OD in order to prevent rapid progression to PDR if anytime in the future. 2.  POD completed without difficulty.  3.  Ophthalmic Meds Ordered this visit:  No orders of the defined types were placed in this encounter.      Return in about 6 months (around 03/15/2021) for DILATE OU, COLOR FP.  There are no Patient Instructions on file for this visit.   Explained the diagnoses, plan, and follow up with the patient and they expressed understanding.  Patient expressed understanding of the importance of proper follow up care.   Clent Demark Kalani Sthilaire M.D. Diseases & Surgery of the Retina and Vitreous Retina [facility] Diabetic [place] 09/14/20     Abbreviations: M myopia (nearsighted); A astigmatism; H hyperopia (farsighted); P presbyopia; Mrx spectacle prescription;  CTL contact lenses; OD right eye; OS left eye; OU both eyes  XT exotropia; ET esotropia; PEK punctate epithelial keratitis; PEE punctate epithelial erosions; DES dry eye syndrome; MGD meibomian gland dysfunction; ATs artificial tears; PFAT's preservative free artificial tears; [place] nuclear sclerotic cataract; PSC posterior subcapsular cataract; ERM epi-retinal membrane; PVD posterior vitreous detachment; RD retinal detachment; DM diabetes mellitus; DR diabetic retinopathy; NPDR non-proliferative diabetic retinopathy; PDR proliferative diabetic retinopathy; CSME clinically significant macular edema; DME diabetic macular edema; dbh dot blot hemorrhages; CWS cotton wool spot; POAG primary open angle glaucoma; C/D cup-to-disc ratio; HVF humphrey visual field; GVF goldmann visual field; OCT optical coherence tomography; IOP intraocular pressure; BRVO Branch retinal vein occlusion; CRVO central retinal vein occlusion; CRAO central retinal artery occlusion; BRAO branch retinal artery occlusion; RT retinal tear; SB scleral buckle; PPV pars plana vitrectomy; VH Vitreous  hemorrhage; PRP panretinal laser photocoagulation; IVK intravitreal kenalog; VMT vitreomacular traction; MH Macular hole;  NVD neovascularization of the disc; NVE neovascularization elsewhere; AREDS age related eye disease study; ARMD age related macular degeneration; POAG primary open angle glaucoma; EBMD epithelial/anterior basement membrane dystrophy; ACIOL anterior chamber intraocular lens; IOL intraocular lens; PCIOL posterior chamber intraocular lens; Phaco/IOL phacoemulsification with intraocular lens placement; [place] photorefractive keratectomy; LASIK laser assisted in situ keratomileusis; HTN hypertension; DM diabetes mellitus; COPD chronic obstructive pulmonary disease

## 2020-09-14 NOTE — Assessment & Plan Note (Signed)
We will plan PRP anterior periphery OD, to regions of retinal nonperfusion.

## 2020-09-16 DIAGNOSIS — E119 Type 2 diabetes mellitus without complications: Secondary | ICD-10-CM | POA: Diagnosis not present

## 2020-09-16 DIAGNOSIS — I10 Essential (primary) hypertension: Secondary | ICD-10-CM | POA: Diagnosis not present

## 2020-09-16 DIAGNOSIS — Z20822 Contact with and (suspected) exposure to covid-19: Secondary | ICD-10-CM | POA: Diagnosis not present

## 2020-09-21 DIAGNOSIS — I452 Bifascicular block: Secondary | ICD-10-CM | POA: Diagnosis not present

## 2020-09-21 DIAGNOSIS — I2510 Atherosclerotic heart disease of native coronary artery without angina pectoris: Secondary | ICD-10-CM | POA: Diagnosis not present

## 2020-09-21 DIAGNOSIS — E113493 Type 2 diabetes mellitus with severe nonproliferative diabetic retinopathy without macular edema, bilateral: Secondary | ICD-10-CM | POA: Diagnosis not present

## 2020-09-21 DIAGNOSIS — Z89431 Acquired absence of right foot: Secondary | ICD-10-CM | POA: Diagnosis not present

## 2020-09-21 DIAGNOSIS — Z951 Presence of aortocoronary bypass graft: Secondary | ICD-10-CM | POA: Diagnosis not present

## 2020-09-21 DIAGNOSIS — Z48812 Encounter for surgical aftercare following surgery on the circulatory system: Secondary | ICD-10-CM | POA: Diagnosis not present

## 2020-09-21 DIAGNOSIS — T8789 Other complications of amputation stump: Secondary | ICD-10-CM | POA: Diagnosis not present

## 2020-09-21 DIAGNOSIS — E11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: Secondary | ICD-10-CM | POA: Diagnosis not present

## 2020-09-21 DIAGNOSIS — Z95820 Peripheral vascular angioplasty status with implants and grafts: Secondary | ICD-10-CM | POA: Diagnosis not present

## 2020-09-21 DIAGNOSIS — Z7984 Long term (current) use of oral hypoglycemic drugs: Secondary | ICD-10-CM | POA: Diagnosis not present

## 2020-09-21 DIAGNOSIS — Z87891 Personal history of nicotine dependence: Secondary | ICD-10-CM | POA: Diagnosis not present

## 2020-09-21 DIAGNOSIS — I70213 Atherosclerosis of native arteries of extremities with intermittent claudication, bilateral legs: Secondary | ICD-10-CM | POA: Diagnosis not present

## 2020-09-28 DIAGNOSIS — D649 Anemia, unspecified: Secondary | ICD-10-CM | POA: Diagnosis not present

## 2020-09-30 ENCOUNTER — Ambulatory Visit (HOSPITAL_COMMUNITY)
Admission: RE | Admit: 2020-09-30 | Discharge: 2020-09-30 | Disposition: A | Discharge status: Home / Self Care | Payer: Medicare Other | Source: Ambulatory Visit | Attending: Vascular Surgery | Admitting: Vascular Surgery

## 2020-09-30 ENCOUNTER — Ambulatory Visit (INDEPENDENT_AMBULATORY_CARE_PROVIDER_SITE_OTHER)
Admission: RE | Admit: 2020-09-30 | Discharge: 2020-09-30 | Disposition: A | Discharge status: Home / Self Care | Payer: Medicare Other | Source: Ambulatory Visit | Attending: Vascular Surgery | Admitting: Vascular Surgery

## 2020-09-30 ENCOUNTER — Other Ambulatory Visit (HOSPITAL_COMMUNITY): Payer: Self-pay | Admitting: Vascular Surgery

## 2020-09-30 ENCOUNTER — Other Ambulatory Visit: Payer: Self-pay

## 2020-09-30 ENCOUNTER — Ambulatory Visit (INDEPENDENT_AMBULATORY_CARE_PROVIDER_SITE_OTHER): Payer: Self-pay | Admitting: Vascular Surgery

## 2020-09-30 ENCOUNTER — Encounter (HOSPITAL_COMMUNITY): Payer: Medicare Other

## 2020-09-30 ENCOUNTER — Other Ambulatory Visit (HOSPITAL_COMMUNITY): Payer: Self-pay

## 2020-09-30 ENCOUNTER — Encounter: Payer: Self-pay | Admitting: Vascular Surgery

## 2020-09-30 ENCOUNTER — Other Ambulatory Visit (HOSPITAL_COMMUNITY): Payer: Medicare Other

## 2020-09-30 VITALS — BP 162/82 | HR 83 | Temp 98.1°F | Resp 20 | Ht 64.5 in | Wt 145.0 lb

## 2020-09-30 DIAGNOSIS — I739 Peripheral vascular disease, unspecified: Secondary | ICD-10-CM

## 2020-09-30 DIAGNOSIS — I70235 Atherosclerosis of native arteries of right leg with ulceration of other part of foot: Secondary | ICD-10-CM | POA: Diagnosis not present

## 2020-09-30 NOTE — Progress Notes (Signed)
    Subjective:     Patient ID: [name], female   DOB: 08/23/1941, 80 y.o.   MRN: [id number]  HPI 80 year old female is 6 weeks out from her exposure of her right common femoral artery with endarterectomy and right common femoral to above-knee popliteal artery bypass with graft.  She had a draining sinus on her right transmetatarsal amputation which is now healed she also had a saphenectomy site on her right medial leg which is now healed as well.  She does have some redness of her above-knee popliteal incision without any drainage or fevers.   Review of Systems Redness of right above-knee popliteal incision    Objective:   Physical Exam Vitals:   09/30/20 1556  BP: (!) 162/82  Pulse: 83  Resp: 20  Temp: 98.1 F (36.7 C)  SpO2: 98%   Awake alert oriented Nonlabored respirations Right groin incision well-healed Right above-knee amputation does have some blanching redness although no warmth or drainage 1+ pitting edema right leg below the knee Strong right peroneal signal Right leg medial saphenectomy site healed Well healed right transmetatarsal amputation    Assessment/plan     80 year old female status post right common femoral endarterectomy with femoropopliteal bypass with graft.  She does have some redness to the above-knee incision.  For this we will see her back in a few weeks.  She will otherwise follow-up with Dr.Berry.    Dago Jungwirth C. Donzetta Matters, MD [facility] Office: [phone] Pager: [phone]

## 2020-10-17 ENCOUNTER — Other Ambulatory Visit: Payer: Self-pay | Admitting: Endocrinology

## 2020-10-19 ENCOUNTER — Telehealth: Payer: Self-pay | Admitting: Cardiovascular Disease

## 2020-10-19 MED ORDER — PANTOPRAZOLE SODIUM 40 MG PO TBEC: 40.0000 mg | DELAYED_RELEASE_TABLET | Freq: Every day | ORAL | 1 refills | Status: DC

## 2020-10-19 NOTE — Telephone Encounter (Signed)
Rx has been sent to the pharmacy electronically. ° °

## 2020-10-19 NOTE — Telephone Encounter (Signed)
*  STAT* If patient is at the pharmacy, call can be transferred to refill team.   1. Which medications need to be refilled? (please list name of each medication and dose if known) pantoprazole (PROTONIX) 40 MG tablet  2. Which pharmacy/location (including street and city if local pharmacy) is medication to be sent to? [place], [place] - [address]  3. Do they need a 30 day or 90 day supply? 90 day   Patient is out of medication. She takes 1 tablet 3 times a week.

## 2020-11-07 DIAGNOSIS — H02831 Dermatochalasis of right upper eyelid: Secondary | ICD-10-CM | POA: Diagnosis not present

## 2020-11-07 DIAGNOSIS — H0102A Squamous blepharitis right eye, upper and lower eyelids: Secondary | ICD-10-CM | POA: Diagnosis not present

## 2020-11-07 DIAGNOSIS — H02834 Dermatochalasis of left upper eyelid: Secondary | ICD-10-CM | POA: Diagnosis not present

## 2020-11-07 DIAGNOSIS — H47011 Ischemic optic neuropathy, right eye: Secondary | ICD-10-CM | POA: Diagnosis not present

## 2020-11-07 DIAGNOSIS — Z961 Presence of intraocular lens: Secondary | ICD-10-CM | POA: Diagnosis not present

## 2020-11-07 DIAGNOSIS — H0102B Squamous blepharitis left eye, upper and lower eyelids: Secondary | ICD-10-CM | POA: Diagnosis not present

## 2020-11-11 ENCOUNTER — Ambulatory Visit: Payer: Medicare Other

## 2020-11-16 ENCOUNTER — Ambulatory Visit (HOSPITAL_COMMUNITY)
Admission: RE | Admit: 2020-11-16 | Discharge: 2020-11-16 | Disposition: A | Discharge status: Home / Self Care | Payer: Medicare Other | Source: Ambulatory Visit | Attending: Cardiovascular Disease | Admitting: Cardiovascular Disease

## 2020-11-16 ENCOUNTER — Other Ambulatory Visit (HOSPITAL_COMMUNITY): Payer: Self-pay | Admitting: Cardiovascular Disease

## 2020-11-16 ENCOUNTER — Other Ambulatory Visit: Payer: Self-pay

## 2020-11-16 DIAGNOSIS — I6523 Occlusion and stenosis of bilateral carotid arteries: Secondary | ICD-10-CM | POA: Diagnosis not present

## 2020-11-18 ENCOUNTER — Other Ambulatory Visit: Payer: Self-pay | Admitting: Endocrinology

## 2020-11-18 ENCOUNTER — Other Ambulatory Visit: Payer: Self-pay | Admitting: Cardiology

## 2020-12-14 DIAGNOSIS — L821 Other seborrheic keratosis: Secondary | ICD-10-CM | POA: Diagnosis not present

## 2020-12-14 DIAGNOSIS — D2271 Melanocytic nevi of right lower limb, including hip: Secondary | ICD-10-CM | POA: Diagnosis not present

## 2020-12-14 DIAGNOSIS — D1801 Hemangioma of skin and subcutaneous tissue: Secondary | ICD-10-CM | POA: Diagnosis not present

## 2020-12-14 DIAGNOSIS — D2262 Melanocytic nevi of left upper limb, including shoulder: Secondary | ICD-10-CM | POA: Diagnosis not present

## 2020-12-14 DIAGNOSIS — L718 Other rosacea: Secondary | ICD-10-CM | POA: Diagnosis not present

## 2020-12-14 DIAGNOSIS — D485 Neoplasm of uncertain behavior of skin: Secondary | ICD-10-CM | POA: Diagnosis not present

## 2020-12-14 DIAGNOSIS — D2261 Melanocytic nevi of right upper limb, including shoulder: Secondary | ICD-10-CM | POA: Diagnosis not present

## 2020-12-14 DIAGNOSIS — D2272 Melanocytic nevi of left lower limb, including hip: Secondary | ICD-10-CM | POA: Diagnosis not present

## 2020-12-14 DIAGNOSIS — D225 Melanocytic nevi of trunk: Secondary | ICD-10-CM | POA: Diagnosis not present

## 2020-12-14 DIAGNOSIS — Z85828 Personal history of other malignant neoplasm of skin: Secondary | ICD-10-CM | POA: Diagnosis not present

## 2020-12-20 ENCOUNTER — Encounter: Payer: Self-pay | Admitting: Cardiovascular Disease

## 2020-12-20 ENCOUNTER — Ambulatory Visit (INDEPENDENT_AMBULATORY_CARE_PROVIDER_SITE_OTHER): Payer: Medicare Other | Admitting: Cardiovascular Disease

## 2020-12-20 ENCOUNTER — Other Ambulatory Visit: Payer: Self-pay

## 2020-12-20 DIAGNOSIS — E785 Hyperlipidemia, unspecified: Secondary | ICD-10-CM

## 2020-12-20 DIAGNOSIS — I6523 Occlusion and stenosis of bilateral carotid arteries: Secondary | ICD-10-CM | POA: Diagnosis not present

## 2020-12-20 DIAGNOSIS — I10 Essential (primary) hypertension: Secondary | ICD-10-CM

## 2020-12-20 DIAGNOSIS — I739 Peripheral vascular disease, unspecified: Secondary | ICD-10-CM

## 2020-12-20 DIAGNOSIS — Z951 Presence of aortocoronary bypass graft: Secondary | ICD-10-CM | POA: Diagnosis not present

## 2020-12-20 NOTE — Progress Notes (Addendum)
08/22/2021 [name]   1941-06-17  [id number]  Primary Physician [name], MD Primary Cardiologist: [name] MD [name], [name]  HPI:  [name] is a 80 y.o.  mildly overweight widowed Caucasian female mother of 33, grandmother 7 grandchildren referred by Dr. Jacqualyn Posey, her podiatrist for critical limb ischemia.    I last saw her in the office   07/29/2020. She has a history of treated hypertension, diabetes and hyperlipidemia.  She is never had a heart attack or stroke.  She denies chest pain or shortness of breath.  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  She has some local trauma related to socks over Christmas and has developed ischemic appearing wounds on her right second and third toes with recent Dopplers performed 10/09/2018 revealing a right ABI 0.75, left 2.79 with moderately severe right SFA stenosis and an occluded right posterior tibial artery.  She is seeing Dr. Jacqualyn Posey back regularly for aggressive local wound care.    Her wounds continue to heal on her right and left second toes.  These sores are probably a result of local trauma.  Recent ABIs are in the 0.6  range with a high-grade right SFA stenosis and an occluded left popliteal with tibial vessel disease as well.  She does complain of some claudication.  She wishes to proceed with endovascular therapy for wound healing and lifestyle limiting claudication.   I performed angiography on her 06/15/2019 with a failed attempt at crossing the left SFA CTO.  She has one-vessel runoff bilaterally.  She did develop gangrenous right first and second toes.  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 Trula Slade who performed right common femoral endarterectomy with patch angioplasty 07/21/2019 with excellent result.  She did see Dr. Jacqualyn Posey back in the office late  last week who thought her wounds were stable.  The pain in her feet has improved since her endarterectomy.  The plan is to perform orbital atherectomy and PTA of her calcified right SFA as well as intervention on her right tibioperoneal trunk prior to right TMA by Dr. Earleen Newport for limb salvage.   I performed orbital atherectomy followed by drug-coated balloon angioplasty of her right SFA, popliteal and tibioperoneal trunk on 08/03/2019 with excellent result.  Her follow-up Dopplers performed 08/14/2019 revealed a widely patent SFA and tibioperoneal trunk.  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 Jacqualyn Posey on 08/07/2019 which almost completely healed with the assistance of hyperbaric oxygen.   She has been seeing Dr. Dellia Nims in the wound care clinic on a weekly basis.  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  She is on 2 IV antibiotics and continues to get hyperbaric oxygen.  Dr. Dellia Nims does not feel these will heal without inline flow.  The patient is a candidate for tibial pedal access of the left dorsalis pedis.  I reviewed her anatomy with Dr. Fletcher Anon who i performed tibial pedal intervention in March of this year resulting healing of her left foot wound. She did undergo right TMA by Dr. Jacqualyn Posey which healed nicely.   She was admitted with non-STEMI on 03/13/2020 and underwent cardiac catheterization by Dr. Martinique revealing three-vessel disease. 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 Julien Girt 03/24/2020 with an excellent result and was discharged 1  week later. She is recuperated nicely. She does have a small nonhealing wound and a vein harvest site in her right calf. She denies claudication. Recent Dopplers performed 06/08/2020 did reveal an occluded right SFA although she was asymptomatic from this.   Unfortunately she had developed breakdown of her right TMA healed wound now with a small open wound that has been draining.  She does get pain  in her leg when she walks.  Based on this I decided to proceed with urgent angiography potential endovascular therapy for limb salvage.  I angiogrammed her 08/01/2020 revealing an occluded right SFA from the origin to the adductor canal with one-vessel runoff via peroneal.  She did have an 80% tibioperoneal trunk stenosis and 80% restenosis in her prior common femoral endarterectomy site.  She underwent right common femoral endarterectomy with right femoral to above-the-knee popliteal bypass graft by Dr. Donzetta Matters.  She recuperated nicely.  Her TMA wound healed.  Her most recent Doppler studies performed 09/30/2020 revealed a widely patent graft.   Current Meds  Medication Sig   acetaminophen (TYLENOL) 500 MG tablet Take 1,000 mg by mouth every 6 (six) hours as needed for moderate pain or headache.   Ascorbic Acid (VITAMIN C) 100 MG tablet Take 100 mg by mouth daily with supper.    aspirin EC 81 MG tablet Take 81 mg by mouth at bedtime.    CINNAMON PO Take 2 tablets by mouth in the morning, at noon, and at bedtime.    pantoprazole (PROTONIX) 40 MG tablet Take 1 tablet (40 mg total) by mouth daily. (Patient taking differently: Take 40 mg by mouth See admin instructions. Takes 40 mg 3 times a week M- W - F)   Polyethyl Glycol-Propyl Glycol (SYSTANE OP) Place 1 drop into the left eye 2 (two) times daily as needed (Dry eyes).   Probiotic Product ([employer] SENIOR PROBIOTIC PO) Take 1 capsule by mouth daily with breakfast.   pyridoxine (B-6) 100 MG tablet Take 100 mg by mouth every evening.    vitamin B-12 (CYANOCOBALAMIN) 500 MCG tablet Take 500 mcg by mouth daily.   [DISCONTINUED] atorvastatin (LIPITOR) 80 MG tablet Take 80 mg by mouth daily.   [DISCONTINUED] Bacillus Coagulans-Inulin (PROBIOTIC) 1-250 BILLION-MG CAPS Take 1 tablet by mouth daily.   [DISCONTINUED] calcium carbonate (TUMS - DOSED IN MG ELEMENTAL CALCIUM) 500 MG chewable tablet Chew 2 tablets by mouth daily as needed for indigestion or heartburn.     [DISCONTINUED] canagliflozin (INVOKANA) 300 MG TABS tablet Take 300 mg by mouth daily before breakfast.   [DISCONTINUED] clopidogrel (PLAVIX) 75 MG tablet Take 1 tablet by mouth once daily (Patient taking differently: Take 75 mg by mouth daily.)   [DISCONTINUED] glipiZIDE (GLUCOTROL XL) 5 MG 24 hr tablet Take 1 tablet by mouth once daily with breakfast   [DISCONTINUED] hydrochlorothiazide (HYDRODIURIL) 12.5 MG tablet Take 1 tablet by mouth once daily   [DISCONTINUED] metFORMIN (GLUCOPHAGE) 1000 MG tablet Take 1 tablet by mouth twice daily   [DISCONTINUED] metoprolol tartrate (LOPRESSOR) 25 MG tablet Take 1.5 tablets (37.5 mg total) by mouth 2 (two) times daily.   [DISCONTINUED] telmisartan (MICARDIS) 80 MG tablet Take 1 tablet by mouth once daily (Patient taking differently: Take by mouth daily. 1 Tablet Daily)   Current Facility-Administered Medications for the 12/20/20 encounter (Office Visit) with [name], MD  Medication   sodium chloride flush (NS) 0.9 % injection 3 mL     Allergies  Allergen Reactions   Bactrim [Sulfamethoxazole-Trimethoprim] Other (See Comments)    ^  K+( elevated)    Demerol [Meperidine]     Delusional    Scopolamine     Delusional    Tramadol Other (See Comments)    Keeps awake   Versed [Midazolam] Anxiety    Frantic, out of my mind, agitated     Social History   Socioeconomic History   Marital status: Widowed    Spouse name: Not on file   Number of children: Not on file   Years of education: Not on file   Highest education level: Not on file  Occupational History   Not on file  Tobacco Use   Smoking status: Former    Types: Cigarettes    Quit date: 03/31/1972    Years since quitting: 49.4   Smokeless tobacco: Never  Vaping Use   Vaping Use: Never used  Substance and Sexual Activity   Alcohol use: No   Drug use: No   Sexual activity: Not on file  Other Topics Concern   Not on file  Social History Narrative   Not on file   Social  Determinants of Health   Financial Resource Strain: Not on file  Food Insecurity: Not on file  Transportation Needs: Not on file  Physical Activity: Not on file  Stress: Not on file  Social Connections: Not on file  Intimate Partner Violence: Not on file     Review of Systems: General: negative for chills, fever, night sweats or weight changes.  Cardiovascular: negative for chest pain, dyspnea on exertion, edema, orthopnea, palpitations, paroxysmal nocturnal dyspnea or shortness of breath Dermatological: negative for rash Respiratory: negative for cough or wheezing Urologic: negative for hematuria Abdominal: negative for nausea, vomiting, diarrhea, bright red blood per rectum, melena, or hematemesis Neurologic: negative for visual changes, syncope, or dizziness All other systems reviewed and are otherwise negative except as noted above.    Blood pressure 136/62, pulse 79, height 5' 4.5" (1.638 m), weight 144 lb (65.3 kg), SpO2 98 %.  General appearance: alert and no distress Neck: no adenopathy, no carotid bruit, no JVD, supple, symmetrical, trachea midline and thyroid not enlarged, symmetric, no tenderness/mass/nodules Lungs: clear to auscultation bilaterally Heart: regular rate and rhythm, S1, S2 normal, no murmur, click, rub or gallop Extremities: Right TMA Pulses: 2+ and symmetric Skin: Skin color, texture, turgor normal. No rashes or lesions Neurologic: Alert and oriented X 3, normal strength and tone. Normal symmetric reflexes. Normal coordination and gait  EKG sinus rhythm at 79 with right bundle branch block and left anterior fascicular block along with LVH with repull changes.  I personally reviewed this EKG.  ASSESSMENT AND PLAN:   Essential hypertension History of essential hypertension a blood pressure measured today at 136/62.  She is on hydrochlorothiazide, Micardis and metoprolol.  Dyslipidemia History of hyperlipidemia on atorvastatin 80 mg a day with lipid  profile performed 08/17/2020 revealing total cholesterol 77, LDL 33 and HDL 31.  Her most recent lipid profile performed by her PCP 07/31/2021 revealed total cholesterol of 120, LDL 57 and HDL of 37.  Carotid artery disease (HCC) History of carotid artery disease with Dopplers performed 11/16/2020 revealing moderate right and mild left ICA stenosis.  This will be repeated on annual basis.  PAD (peripheral artery disease) (HCC) History of PAD and critical limb ischemia status post proximal left SFA stent, with failed attempt across the left SFA CTO.  I did perform right SFA and popliteal intervention 08/03/2019.  Dr. Trula Slade had previously performed right common femoral endarterectomy and patch angioplasty  07/21/2019.  She came back in with critical limb ischemia with breakdown of her TMA wound.  I angiogrammed her 08/01/2020 revealing an occluded right SFA from the origin down to the adductor canal with an occluded AT and PT and 80% tibioperoneal trunk stenosis with an 80% restenosis is proximal to the prior endarterectomy site.  She underwent endarterectomy and patch angioplasty with fem-tib bypass grafting by Dr. Donzetta Matters.  Is resulted in healing of her wound.  Her most recent Doppler studies performed 09/30/2020 revealed a widely patent graft with a right ABI of 0.84 and a left of 0.59.  S/P CABG x 5 History of CAD status post non-STEMI 03/13/2020 with cardiac catheterization performed by Dr. Martinique revealing three-vessel disease.  She ultimately underwent CABG x5 by Dr. Julien Girt on 03/24/2020 with an excellent result.  She is recovered from this and is asymptomatic.      [name] MD FACP,FACC,FAHA, [facility] 08/22/2021 5:05 PM

## 2020-12-20 NOTE — Assessment & Plan Note (Signed)
History of PAD and critical limb ischemia status post proximal left SFA stent, with failed attempt across the left SFA CTO.  I did perform right SFA and popliteal intervention 08/03/2019.  Dr. Trula Slade had previously performed right common femoral endarterectomy and patch angioplasty 07/21/2019.  She came back in with critical limb ischemia with breakdown of her TMA wound.  I angiogrammed her 08/01/2020 revealing an occluded right SFA from the origin down to the adductor canal with an occluded AT and PT and 80% tibioperoneal trunk stenosis with an 80% restenosis is proximal to the prior endarterectomy site.  She underwent endarterectomy and patch angioplasty with fem-tib bypass grafting by Dr. Donzetta Matters.  Is resulted in healing of her wound.  Her most recent Doppler studies performed 09/30/2020 revealed a widely patent graft with a right ABI of 0.84 and a left of 0.59.

## 2020-12-20 NOTE — Assessment & Plan Note (Signed)
History of carotid artery disease with Dopplers performed 11/16/2020 revealing moderate right and mild left ICA stenosis.  This will be repeated on annual basis.

## 2020-12-20 NOTE — Assessment & Plan Note (Addendum)
History of hyperlipidemia on atorvastatin 80 mg a day with lipid profile performed 08/17/2020 revealing total cholesterol 77, LDL 33 and HDL 31.  Her most recent lipid profile performed by her PCP 07/31/2021 revealed total cholesterol of 120, LDL 57 and HDL of 37.

## 2020-12-20 NOTE — Assessment & Plan Note (Signed)
History of essential hypertension a blood pressure measured today at 136/62.  She is on hydrochlorothiazide, Micardis and metoprolol.

## 2020-12-20 NOTE — Patient Instructions (Signed)
Medication Instructions:  Your physician recommends that you continue on your current medications as directed. Please refer to the Current Medication list given to you today.  *If you need a refill on your cardiac medications before your next appointment, please call your pharmacy*  Testing/Procedures: Your physician has requested that you have a carotid duplex. This test is an ultrasound of the carotid arteries in your neck. It looks at blood flow through these arteries that supply the brain with blood. Allow one hour for this exam. There are no restrictions or special instructions. This procedure is done at [facility]. 2nd Floor. To be done Feb 2023.   Follow-Up: At [facility], you and your health needs are our priority.  As part of our continuing mission to provide you with exceptional heart care, we have created designated Provider Care Teams.  These Care Teams include your primary Cardiologist (physician) and Advanced Practice Providers (APPs -  Physician Assistants and Nurse Practitioners) who all work together to provide you with the care you need, when you need it.  We recommend signing up for the patient portal called "MyChart".  Sign up information is provided on this After Visit Summary.  MyChart is used to connect with patients for Virtual Visits (Telemedicine).  Patients are able to view lab/test results, encounter notes, upcoming appointments, etc.  Non-urgent messages can be sent to your provider as well.   To learn more about what you can do with MyChart, go to [email or web address].    Your next appointment:   12 month(s)  The format for your next appointment:   In Person  Provider:   [name], MD

## 2020-12-20 NOTE — Assessment & Plan Note (Signed)
History of CAD status post non-STEMI 03/13/2020 with cardiac catheterization performed by Dr. Martinique revealing three-vessel disease.  She ultimately underwent CABG x5 by Dr. Julien Girt on 03/24/2020 with an excellent result.  She is recovered from this and is asymptomatic.

## 2020-12-28 DIAGNOSIS — Z961 Presence of intraocular lens: Secondary | ICD-10-CM | POA: Diagnosis not present

## 2020-12-28 DIAGNOSIS — H0102A Squamous blepharitis right eye, upper and lower eyelids: Secondary | ICD-10-CM | POA: Diagnosis not present

## 2020-12-28 DIAGNOSIS — H0102B Squamous blepharitis left eye, upper and lower eyelids: Secondary | ICD-10-CM | POA: Diagnosis not present

## 2020-12-28 DIAGNOSIS — H47011 Ischemic optic neuropathy, right eye: Secondary | ICD-10-CM | POA: Diagnosis not present

## 2020-12-28 DIAGNOSIS — H02834 Dermatochalasis of left upper eyelid: Secondary | ICD-10-CM | POA: Diagnosis not present

## 2020-12-28 DIAGNOSIS — H02831 Dermatochalasis of right upper eyelid: Secondary | ICD-10-CM | POA: Diagnosis not present

## 2021-01-03 ENCOUNTER — Other Ambulatory Visit: Payer: Medicare Other

## 2021-01-06 ENCOUNTER — Ambulatory Visit: Payer: Medicare Other | Admitting: Endocrinology

## 2021-01-18 ENCOUNTER — Other Ambulatory Visit (INDEPENDENT_AMBULATORY_CARE_PROVIDER_SITE_OTHER): Payer: Medicare Other

## 2021-01-18 ENCOUNTER — Other Ambulatory Visit: Payer: Self-pay

## 2021-01-18 DIAGNOSIS — E1159 Type 2 diabetes mellitus with other circulatory complications: Secondary | ICD-10-CM

## 2021-01-18 LAB — BASIC METABOLIC PANEL
BUN: 29 mg/dL — ABNORMAL HIGH (ref 6–23)
CO2: 26 mEq/L (ref 19–32)
Calcium: 9.9 mg/dL (ref 8.4–10.5)
Chloride: 102 mEq/L (ref 96–112)
Creatinine, Ser: 1.18 mg/dL (ref 0.40–1.20)
GFR: 44.13 mL/min — ABNORMAL LOW (ref >60.00)
Glucose, Bld: 258 mg/dL — ABNORMAL HIGH (ref 70–99)
Potassium: 4.5 mEq/L (ref 3.5–5.1)
Sodium: 138 mEq/L (ref 135–145)

## 2021-01-18 LAB — HEMOGLOBIN A1C: Hgb A1c MFr Bld: 7.7 % — ABNORMAL HIGH (ref 4.6–6.5)

## 2021-01-23 ENCOUNTER — Encounter: Payer: Self-pay | Admitting: Endocrinology

## 2021-01-23 ENCOUNTER — Other Ambulatory Visit: Payer: Self-pay

## 2021-01-23 ENCOUNTER — Ambulatory Visit (INDEPENDENT_AMBULATORY_CARE_PROVIDER_SITE_OTHER): Payer: Medicare Other | Admitting: Endocrinology

## 2021-01-23 VITALS — BP 130/86 | HR 89 | Ht 65.0 in | Wt 150.6 lb

## 2021-01-23 DIAGNOSIS — I6523 Occlusion and stenosis of bilateral carotid arteries: Secondary | ICD-10-CM

## 2021-01-23 DIAGNOSIS — E1165 Type 2 diabetes mellitus with hyperglycemia: Secondary | ICD-10-CM | POA: Diagnosis not present

## 2021-01-23 MED ORDER — GLIMEPIRIDE 2 MG PO TABS: 2.0000 mg | ORAL_TABLET | Freq: Every day | ORAL | 3 refills | Status: DC

## 2021-01-23 NOTE — Progress Notes (Signed)
Patient ID: [name], female   DOB: [birth year], 80 y.o.   MRN: [id number]          Reason for Appointment: Follow-up for endocrinology problems   History of Present Illness:          Date of diagnosis of type 2 diabetes mellitus: 1984       Background history:   [name] has been on metformin since about the time of her diagnosis and has been on other drugs also but has not had many changes in her regimen in the last several years [name] thinks her blood sugar used to be fairly well controlled At some point [name] was given Byetta for about a year and a half which improved her control but this was subsequently start because of it being too expensive for her.  [name] had no side effects with this  Recent history:   Her A1c is 7.7, previously was down to 6.5  Non-insulin hypoglycemic regimen: Glipizide ER 5 mg daily, Invokana 300 mg daily,   metformin 1 g twice daily  Current management, blood sugar patterns and problems identified:   Unclear why her A1c is higher since her blood sugars fasting are averaging below 100  However her lab glucose was 258 although this was 45 minutes after eating breakfast  [name] has only 2 high readings at home, after lunch  Not checking readings after meals again especially in the evening  [name] is not able to do much physical exercise because of leg pain  [name] thinks that her diet is fairly good and [name] is trying to get protein usually although sometimes will eat cereal in the morning or oatmeal with peanuts  Weight is up 6 pounds recently  Taking glipizide in the evening         Side effects from medications have been: None  Compliance with the medical regimen: Good Hypoglycemia:   None  Glucose monitoring:  About 1 times a day         Glucometer: One Touch.       Blood Glucose readings    PRE-MEAL Fasting Lunch Dinner Bedtime Overall  Glucose range:  76-122      Mean/median:  98    108   POST-MEAL PC Breakfast PC Lunch PC Dinner  Glucose  range:   147, 184   Mean/median:        PREVIOUS AVERAGE 127, previously 120  BLOOD sugar range for the last 30 days 88-169   Self-care: The diet that the patient has been following is: tries to limit sweets and fried food.     Meal times are:  Breakfast is at 8:30 AM lunch: 1 PM, dinner: 6:30 PM  Typical meal intake: Breakfast is yogurt with fruit or egg/toast or oatmeal Trying to get some boost with protein  Lunch is usually soup with crackers, dinner is usually a low-fat meat with vegetables Snacks will be celery and ranch dressing               Dietician visit, most recent: 10/18                Weight history:  Wt Readings from Last 3 Encounters:  01/23/21 150 lb 9.6 oz (68.3 kg)  12/20/20 144 lb (65.3 kg)  09/30/20 145 lb (65.8 kg)    Glycemic control:   Lab Results  Component Value Date   HGBA1C 7.7 (H) 01/18/2021   HGBA1C 6.5 08/30/2020   HGBA1C 7.1 (H) 08/10/2020  Lab Results  Component Value Date   MICROALBUR 1.0 08/30/2020   LDLCALC 33 08/17/2020   CREATININE 1.18 01/18/2021   Lab Results  Component Value Date   MICRALBCREAT 2.0 08/30/2020    Lab Results  Component Value Date   FRUCTOSAMINE 243 03/18/2019   FRUCTOSAMINE 268 10/21/2018   FRUCTOSAMINE 267 05/30/2018      Allergies as of 01/23/2021      Reactions   Bactrim [sulfamethoxazole-trimethoprim] Other (See Comments)   ^ K+( elevated)    Demerol [meperidine]    Delusional    Scopolamine    Delusional    Versed [midazolam] Anxiety   Frantic, out of my mind, agitated       Medication List       Accurate as of Jan 23, 2021 10:48 AM. If you have any questions, ask your nurse or doctor.        acetaminophen 500 MG tablet Commonly known as: TYLENOL Take 1,000 mg by mouth every 6 (six) hours as needed for moderate pain or headache.   aspirin EC 81 MG tablet Take 81 mg by mouth at bedtime.   atorvastatin 80 MG tablet Commonly known as: LIPITOR Take 80 mg by mouth daily. Take 1  Tablet Daily   calcium carbonate 500 MG chewable tablet Commonly known as: TUMS - dosed in mg elemental calcium Chew 2 tablets by mouth daily as needed for indigestion or heartburn.   canagliflozin 300 MG Tabs tablet Commonly known as: INVOKANA Take 300 mg by mouth daily before breakfast.   CINNAMON PO Take 2 tablets by mouth in the morning, at noon, and at bedtime.   clopidogrel 75 MG tablet Commonly known as: PLAVIX Take 1 tablet by mouth once daily   [employer] SENIOR PROBIOTIC PO Take 1 capsule by mouth daily with breakfast.   glipiZIDE 5 MG 24 hr tablet Commonly known as: GLUCOTROL XL Take 1 tablet by mouth once daily with breakfast   hydrochlorothiazide 12.5 MG tablet Commonly known as: HYDRODIURIL Take 1 tablet by mouth once daily   metFORMIN 1000 MG tablet Commonly known as: GLUCOPHAGE Take 1 tablet by mouth twice daily   metoprolol tartrate 25 MG tablet Commonly known as: LOPRESSOR Take 1.5 tablets (37.5 mg total) by mouth 2 (two) times daily.   pantoprazole 40 MG tablet Commonly known as: PROTONIX Take 1 tablet (40 mg total) by mouth daily.   Probiotic 1-250 BILLION-MG Caps See admin instructions.   pyridoxine 100 MG tablet Commonly known as: B-6 Take 100 mg by mouth every evening.   SYSTANE OP Place 1 drop into both eyes 2 (two) times daily as needed (Dry eyes).   telmisartan 80 MG tablet Commonly known as: MICARDIS Take 1 tablet by mouth once daily What changed:   how much to take  additional instructions   vitamin B-12 500 MCG tablet Commonly known as: CYANOCOBALAMIN Take 500 mcg by mouth daily.   vitamin C 100 MG tablet Take 100 mg by mouth daily with supper.       Allergies:  Allergies  Allergen Reactions  . Bactrim [Sulfamethoxazole-Trimethoprim] Other (See Comments)    ^ K+( elevated)   . Demerol [Meperidine]     Delusional   . Scopolamine     Delusional   . Versed [Midazolam] Anxiety    Frantic, out of my mind, agitated      Past Medical History:  Diagnosis Date  . 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 Arthritis   .  Cancer ([place])    removal from nose - MOSE procedure   . Complication of anesthesia    VERSED- agitated, muscle spasms, "jerking" , frantic , (never had this occurence in the [place])   . Coronary artery disease   . Diabetes mellitus without complication ([place])   . Dysrhythmia    PVC's  . GERD (gastroesophageal reflux disease)   . Heart murmur   . History of hiatal hernia   . Hyperlipidemia   . Hypertension 11/20/11   ECHO- EF>55% Borderline concentric left ventricular hypertrophy. There is a small calcified mass in the L:A near the LA appendage. No valvular masses seen with associated mitral annular calcification. LA Volume/ BSA27.4 ml/m2 No AS. Right ventricular systolic pressure is elevated at 68mmHg.  . Myocardial infarction [facility])    June 2021  . Neuromuscular disorder (H[facility])    neuropathy in bilateral feet  . Peripheral vascular disease ([place])   . Pneumonia    not hosp.   . 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  PTCAs with drug coated balloon Lt ant tibial artery and Lt popliteal artery  11/26/2019    Past Surgical History:  Procedure Laterality Date  . ABDOMINAL AORTAGRAM  11/25/2019   ABDOMINAL AORTOGRAM W/LOWER EXTREMITY   . ABDOMINAL AORTOGRAM N/A 06/15/2019   Procedure: ABDOMINAL AORTOGRAM;  Surgeon: [name], MD;  Location: [place] [facility];  Service: Cardiovascular;  Laterality: N/A;  . ABDOMINAL AORTOGRAM W/LOWER EXTREMITY N/A 11/25/2019   Procedure: ABDOMINAL AORTOGRAM W/LOWER EXTREMITY;  Surgeon: [name], MD;  Location: [place] [facility];  Service: Cardiovascular;  Laterality: N/A;  Lt leg  . ABDOMINAL AORTOGRAM W/LOWER EXTREMITY N/A 08/01/2020   Procedure: ABDOMINAL AORTOGRAM W/LOWER EXTREMITY;  Surgeon: [name], MD;  Location: [place] [facility];  Service: Cardiovascular;  Laterality: N/A;  . APPENDECTOMY    . APPLICATION OF WOUND VAC  Right 07/21/2019   Procedure: Application Of Prevena Wound Vac Right Groin;  Surgeon: [name], MD;  Location: [place];  Service: Vascular;  Laterality: Right;  . CARDIAC CATHETERIZATION    . CARPAL TUNNEL RELEASE    . CARPAL TUNNEL RELEASE    . CESAREAN SECTION     x 2  . COLONOSCOPY    . CORONARY ARTERY BYPASS GRAFT N/A 03/24/2020   Procedure: CORONARY ARTERY BYPASS GRAFTING (CABG) using LIMA to LAD (m); RIMA to RAMUS; Endoscopic Right Greater Saphenous Vein: SVG to Diag1; SVG to PLB (right); and SVG to PL (left).;  Surgeon: [name], MD;  Location: [place];  Service: Open Heart Surgery;  Laterality: N/A;  BILATERAL IMA  . ENDARTERECTOMY FEMORAL Right 07/21/2019   Procedure: RIGHT ENDARTERECTOMY FEMORAL WITH PATCH ANGIOPLASTY;  Surgeon: [name], MD;  Location: [place];  Service: Vascular;  Laterality: Right;  . ENDARTERECTOMY FEMORAL Right 08/16/2020   Procedure: RIGHT FEMORAL ENDARTERECTOMY;  Surgeon: [name], MD;  Location: [facility];  Service: Vascular;  Laterality: Right;  . ENDOVEIN HARVEST OF GREATER SAPHENOUS VEIN Right 03/24/2020   Procedure: ENDOVEIN HARVEST OF [place];  Surgeon: [name], MD;  Location: [place];  Service: Open Heart Surgery;  Laterality: Right;  . EYE SURGERY     cataract removal bilaterally  . FEMORAL-POPLITEAL BYPASS GRAFT Right 08/16/2020   Procedure: BYPASS GRAFT FEMORAL-POPLITEAL ARTERY;  Surgeon: [name], MD;  Location: [place];  Service: Vascular;  Laterality: Right;  . INTRAVASCULAR LITHOTRIPSY  11/25/2019   Procedure: INTRAVASCULAR LITHOTRIPSY;  Surgeon: [name], MD;  Location: [place] [facility];  Service: Cardiovascular;;  LT. SFA  . LEFT HEART CATH AND CORONARY ANGIOGRAPHY N/A 03/22/2020   Procedure: LEFT HEART CATH AND CORONARY ANGIOGRAPHY;  Surgeon: Martinique, Peter M, MD;  Location: [place] [facility];  Service: Cardiovascular;  Laterality: N/A;  . LOWER EXTREMITY ANGIOGRAPHY  Bilateral 06/15/2019   Procedure: Lower Extremity Angiography;  Surgeon: [name], MD;  Location: [place] [facility];  Service: Cardiovascular;  Laterality: Bilateral;  . LOWER EXTREMITY ANGIOGRAPHY Right 08/03/2019   Procedure: LOWER EXTREMITY ANGIOGRAPHY;  Surgeon: [name], MD;  Location: [place] [facility];  Service: Cardiovascular;  Laterality: Right;  . PATCH ANGIOPLASTY Right 07/21/2019   Procedure: Patch Angioplasty Right Femoral Artery;  Surgeon: [name], MD;  Location: [place];  Service: Vascular;  Laterality: Right;  . PERIPHERAL VASCULAR ATHERECTOMY  08/03/2019   Procedure: PERIPHERAL VASCULAR ATHERECTOMY;  Surgeon: [name], MD;  Location: [place] [facility];  Service: Cardiovascular;;  right SFA, right TP trunk  . PERIPHERAL VASCULAR ATHERECTOMY  11/25/2019   Procedure: PERIPHERAL VASCULAR ATHERECTOMY;  Surgeon: [name], MD;  Location: [place] [facility];  Service: Cardiovascular;;  Lt.  POPLITEAL and AT  . PERIPHERAL VASCULAR BALLOON ANGIOPLASTY Left 06/15/2019   Procedure: PERIPHERAL VASCULAR BALLOON ANGIOPLASTY;  Surgeon: [name], MD;  Location: [place] [facility];  Service: Cardiovascular;  Laterality: Left;  SFA UNSUCCESSFUL UNABLE TO CROSS LESION  . PERIPHERAL VASCULAR BALLOON ANGIOPLASTY  08/03/2019   Procedure: PERIPHERAL VASCULAR BALLOON ANGIOPLASTY;  Surgeon: [name], MD;  Location: [place] [facility];  Service: Cardiovascular;;  right SFA, Right TP trunk  . TEE WITHOUT CARDIOVERSION N/A 03/24/2020   Procedure: TRANSESOPHAGEAL ECHOCARDIOGRAM (TEE);  Surgeon: [name], MD;  Location: [place];  Service: Open Heart Surgery;  Laterality: N/A;  . TONSILLECTOMY     and adenoidectomy  . 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 TUBAL LIGATION      Family History  Problem  Relation Age of Onset  . Cancer - Prostate Father   . Cancer - Colon Father   . Stroke Mother   . Hypertension Mother   . Hyperlipidemia Mother   . Melanoma Brother     Social History:  reports that [name] quit smoking about 48 years ago. [name] has never used smokeless tobacco. [name] reports that [name] does not drink alcohol and does not use drugs.   Review of Systems   Lipid history: Hypercholesterolemia by PCP with 20 mg Lipitor    Lab Results  Component Value Date   CHOL 77 08/17/2020   HDL 31 (L) 08/17/2020   LDLCALC 33 08/17/2020   TRIG 63 08/17/2020   CHOLHDL 2.5 08/17/2020           Hypertension: Present for several years, currently treated with  Micardis 40 mg, metoprolol, diltiazem and HCTZ, also followed by cardiologist Micardis was reduced in 06/2019 because of low normal blood pressure readings [name] monitors at home  BP Readings from Last 3 Encounters:  01/23/21 130/86  12/20/20 136/62  09/30/20 (!) 162/82   Lab Results  Component Value Date   CREATININE 1.18 01/18/2021   CREATININE 0.95 08/30/2020   CREATININE 0.95 08/18/2020     Most recent eye exam was in 4/21    Lab Results  Component Value  Date   HGB 7.0 (L) 08/18/2020     [name] has had significant PAD followed by vascular cardiologist   LABS:  Lab on 01/18/2021  Component Date Value Ref Range Status  . Sodium 01/18/2021 138  135 - 145 mEq/L Final  . Potassium 01/18/2021 4.5  3.5 - 5.1 mEq/L Final  . Chloride 01/18/2021 102  96 - 112 mEq/L Final  . CO2 01/18/2021 26  19 - 32 mEq/L Final  . Glucose, Bld 01/18/2021 258* 70 - 99 mg/dL Final  . BUN 01/18/2021 29* 6 - 23 mg/dL Final  . Creatinine, Ser 01/18/2021 1.18  0.40 - 1.20 mg/dL Final  . GFR 01/18/2021 44.13* >60.00 mL/min Final   Calculated using the CKD-EPI Creatinine Equation (2021)  . Calcium 01/18/2021 9.9  8.4 - 10.5 mg/dL Final  . Hgb A1c MFr Bld 01/18/2021 7.7* 4.6 - 6.5 % Final   Glycemic Control Guidelines for People with  Diabetes:Non Diabetic:  <6%Goal of Therapy: <7%Additional Action Suggested:  >8%     Physical Examination:  BP 130/86   Pulse 89   Ht 5\' 5"  (1.651 m)   Wt 150 lb 9.6 oz (68.3 kg)   SpO2 99%   BMI 25.06 kg/m       ASSESSMENT:  Diabetes type 2  See history of present illness for detailed discussion of current diabetes management, blood sugar patterns and problems identified  Her A1c is much higher at 7.7 Previously A1c 6.5 although may have been affected by anemia  Currently on Invokana 300 mg, Metformin and glipizide  Although her fasting blood sugars are excellent and may be low normal at times [name] may have high readings after meals which [name] does not monitor Also has gained weight  PLAN:    Trial of Amaryl instead of glipizide and [name] can take that before breakfast with her Invokana Consider Prandin if postprandial readings are consistently high Reminded her of the importance of checking blood sugars after meals consistently [name] will need to make sure [name] has some protein at breakfast because of tendency to high readings in the morning at least Follow-up in 2 months   There are no Patient Instructions on file for this visit.   Elayne Snare 01/23/2021, 10:48 AM   Note: This office note was prepared with Dragon voice recognition system technology. Any transcriptional errors that result from this process are unintentional.

## 2021-01-23 NOTE — Patient Instructions (Addendum)
Check blood sugars on waking up 2-3  days a week  Also check blood sugars about 2 hours after meals and do this after different meals by rotation  Recommended blood sugar levels on waking up are 90-130 and about 2 hours after meal is 130-160  Please bring your blood sugar monitor to each visit, thank you  Change Glipizide to Glimeperide in am

## 2021-02-08 DIAGNOSIS — Z89431 Acquired absence of right foot: Secondary | ICD-10-CM | POA: Diagnosis not present

## 2021-02-08 DIAGNOSIS — I10 Essential (primary) hypertension: Secondary | ICD-10-CM | POA: Diagnosis not present

## 2021-02-08 DIAGNOSIS — D509 Iron deficiency anemia, unspecified: Secondary | ICD-10-CM | POA: Diagnosis not present

## 2021-02-08 DIAGNOSIS — E11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: Secondary | ICD-10-CM | POA: Diagnosis not present

## 2021-02-08 DIAGNOSIS — N6321 Unspecified lump in the left breast, upper outer quadrant: Secondary | ICD-10-CM | POA: Diagnosis not present

## 2021-02-15 DIAGNOSIS — R922 Inconclusive mammogram: Secondary | ICD-10-CM | POA: Diagnosis not present

## 2021-02-15 DIAGNOSIS — N6321 Unspecified lump in the left breast, upper outer quadrant: Secondary | ICD-10-CM | POA: Diagnosis not present

## 2021-02-17 ENCOUNTER — Other Ambulatory Visit: Payer: Self-pay | Admitting: Cardiology

## 2021-02-17 ENCOUNTER — Other Ambulatory Visit: Payer: Self-pay | Admitting: Endocrinology

## 2021-03-08 ENCOUNTER — Encounter (INDEPENDENT_AMBULATORY_CARE_PROVIDER_SITE_OTHER): Payer: Self-pay | Admitting: Ophthalmology

## 2021-03-08 ENCOUNTER — Other Ambulatory Visit: Payer: Self-pay

## 2021-03-08 ENCOUNTER — Ambulatory Visit (INDEPENDENT_AMBULATORY_CARE_PROVIDER_SITE_OTHER): Payer: Medicare Other | Admitting: Ophthalmology

## 2021-03-08 DIAGNOSIS — H35372 Puckering of macula, left eye: Secondary | ICD-10-CM | POA: Diagnosis not present

## 2021-03-08 DIAGNOSIS — H47011 Ischemic optic neuropathy, right eye: Secondary | ICD-10-CM

## 2021-03-08 DIAGNOSIS — E113493 Type 2 diabetes mellitus with severe nonproliferative diabetic retinopathy without macular edema, bilateral: Secondary | ICD-10-CM

## 2021-03-08 NOTE — Assessment & Plan Note (Signed)
Detected clinically OS but not significant by OCT and no impact on acuity OS

## 2021-03-08 NOTE — Assessment & Plan Note (Signed)
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

## 2021-03-08 NOTE — Assessment & Plan Note (Signed)
Only judged by mild temporal atrophy in the p.m. bundle region

## 2021-03-08 NOTE — Progress Notes (Signed)
03/08/2021     CHIEF COMPLAINT Patient presents for Retina Follow Up (6 Mo F/U OU//Pt sts DVA OU has slightly decreased over time. No other new symptoms OU./A1c: 6.5 or 6.8 pr pt, 3 mo ago/LBS: 109 this AM)   HISTORY OF PRESENT ILLNESS: [name] is a 80 y.o. female who presents to the clinic today for:   HPI     Retina Follow Up           Diagnosis: Diabetic Retinopathy   Laterality: both eyes   Onset: 6 months ago   Severity: mild   Duration: 6 months   Course: gradually worsening   Comments: 6 Mo F/U OU  Pt sts DVA OU has slightly decreased over time. No other new symptoms OU. A1c: 6.5 or 6.8 pr pt, 3 mo ago LBS: 109 this AM       Last edited by Milly Jakob, [place] on 03/08/2021  8:29 AM.      Referring physician: [name], MD [place],  [place] [address]  HISTORICAL INFORMATION:   Selected notes from the MEDICAL RECORD NUMBER    Lab Results  Component Value Date   HGBA1C 7.7 (H) 01/18/2021     CURRENT MEDICATIONS: Current Outpatient Medications (Ophthalmic Drugs)  Medication Sig   brimonidine (ALPHAGAN) 0.2 % ophthalmic solution Place 1 drop into the right eye 2 (two) times daily.   Polyethyl Glycol-Propyl Glycol (SYSTANE OP) Place 1 drop into both eyes 2 (two) times daily as needed (Dry eyes).   No current facility-administered medications for this visit. (Ophthalmic Drugs)   Current Outpatient Medications (Other)  Medication Sig   acetaminophen (TYLENOL) 500 MG tablet Take 1,000 mg by mouth every 6 (six) hours as needed for moderate pain or headache.   Ascorbic Acid (VITAMIN C) 100 MG tablet Take 100 mg by mouth daily with supper.    aspirin EC 81 MG tablet Take 81 mg by mouth at bedtime.    atorvastatin (LIPITOR) 80 MG tablet Take 80 mg by mouth daily. Take 1 Tablet Daily   Bacillus Coagulans-Inulin (PROBIOTIC) 1-250 BILLION-MG CAPS See admin instructions.   calcium carbonate (TUMS - DOSED IN MG ELEMENTAL CALCIUM)  500 MG chewable tablet Chew 2 tablets by mouth daily as needed for indigestion or heartburn.    canagliflozin (INVOKANA) 300 MG TABS tablet Take 300 mg by mouth daily before breakfast.   CINNAMON PO Take 2 tablets by mouth in the morning, at noon, and at bedtime.    clopidogrel (PLAVIX) 75 MG tablet Take 1 tablet by mouth once daily (Patient taking differently: Take 75 mg by mouth daily.)   glimepiride (AMARYL) 2 MG tablet Take 1 tablet (2 mg total) by mouth daily before breakfast.   hydrochlorothiazide (HYDRODIURIL) 12.5 MG tablet Take 1 tablet by mouth once daily   metFORMIN (GLUCOPHAGE) 1000 MG tablet Take 1 tablet by mouth twice daily   metoprolol tartrate (LOPRESSOR) 25 MG tablet Take 1.5 tablets (37.5 mg total) by mouth 2 (two) times daily.   pantoprazole (PROTONIX) 40 MG tablet Take 1 tablet (40 mg total) by mouth daily.   Probiotic Product ([employer] SENIOR PROBIOTIC PO) Take 1 capsule by mouth daily with breakfast.   pyridoxine (B-6) 100 MG tablet Take 100 mg by mouth every evening.    telmisartan (MICARDIS) 80 MG tablet Take 1 tablet by mouth once daily (Patient taking differently: Take by mouth daily. 1 Tablet Daily)   vitamin B-12 (CYANOCOBALAMIN) 500 MCG tablet Take  500 mcg by mouth daily.   Current Facility-Administered Medications (Other)  Medication Route   sodium chloride flush (NS) 0.9 % injection 3 mL Intravenous      REVIEW OF SYSTEMS:    ALLERGIES Allergies  Allergen Reactions   Bactrim [Sulfamethoxazole-Trimethoprim] Other (See Comments)    ^ K+( elevated)    Demerol [Meperidine]     Delusional    Scopolamine     Delusional    Versed [Midazolam] Anxiety    Frantic, out of my mind, agitated     PAST MEDICAL HISTORY Past Medical History:  Diagnosis Date   Anemia    after CABG in june 2021   Arthritis    Cancer ([place])    removal from nose - MOSE procedure    Complication of anesthesia    VERSED- agitated, muscle spasms, "jerking" , frantic , (never had this  occurence in the pas)    Coronary artery disease    Diabetes mellitus without complication ([place])    Dysrhythmia    PVC's   GERD (gastroesophageal reflux disease)    Heart murmur    History of hiatal hernia    Hyperlipidemia    Hypertension 11/20/11   ECHO- EF>55% Borderline concentric left ventricular hypertrophy. There is a small calcified mass in the L:A near the LA appendage. No valvular masses seen with associated mitral annular calcification. LA Volume/ BSA27.4 ml/m2 No AS. Right ventricular systolic pressure is elevated at 69mmHg.   Myocardial infarction [facility])    June 2021   Neuromuscular disorder ([place])    neuropathy in bilateral feet   Peripheral vascular disease ([place])    Pneumonia    not hosp.    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  PTCAs with drug coated balloon Lt ant tibial artery and Lt popliteal artery  11/26/2019   Past Surgical History:  Procedure Laterality Date   ABDOMINAL AORTAGRAM  11/25/2019   ABDOMINAL AORTOGRAM W/LOWER EXTREMITY    ABDOMINAL AORTOGRAM N/A 06/15/2019   Procedure: ABDOMINAL AORTOGRAM;  Surgeon: [name], MD;  Location: [place] [facility];  Service: Cardiovascular;  Laterality: N/A;   ABDOMINAL AORTOGRAM W/LOWER EXTREMITY N/A 11/25/2019   Procedure: ABDOMINAL AORTOGRAM W/LOWER EXTREMITY;  Surgeon: [name], MD;  Location: [place] [facility];  Service: Cardiovascular;  Laterality: N/A;  Lt leg   ABDOMINAL AORTOGRAM W/LOWER EXTREMITY N/A 08/01/2020   Procedure: ABDOMINAL AORTOGRAM W/LOWER EXTREMITY;  Surgeon: [name], MD;  Location: [place] [facility];  Service: Cardiovascular;  Laterality: N/A;   APPENDECTOMY     APPLICATION OF WOUND VAC Right 07/21/2019   Procedure: Application Of Prevena Wound Vac Right Groin;  Surgeon: [name], MD;  Location: [facility];  Service: Vascular;  Laterality: Right;   CARDIAC CATHETERIZATION     CARPAL [place]     x 2    COLONOSCOPY     CORONARY ARTERY BYPASS GRAFT N/A 03/24/2020   Procedure: CORONARY ARTERY BYPASS GRAFTING (CABG) using LIMA to LAD (m); RIMA to RAMUS; Endoscopic Right Greater Saphenous Vein: SVG to Diag1; SVG to PLB (right); and SVG to PL (left).;  Surgeon: [name], MD;  Location: [facility];  Service: Open Heart Surgery;  Laterality: N/A;  BILATERAL IMA   ENDARTERECTOMY FEMORAL Right 07/21/2019   Procedure: RIGHT ENDARTERECTOMY FEMORAL WITH PATCH ANGIOPLASTY;  Surgeon: [name], MD;  Location: [facility];  Service: Vascular;  Laterality: Right;   ENDARTERECTOMY FEMORAL Right 08/16/2020   Procedure: RIGHT FEMORAL ENDARTERECTOMY;  Surgeon: [name], MD;  [facility];  Service: Vascular;  Laterality: Right;   ENDOVEIN HARVEST OF GREATER SAPHENOUS VEIN Right 03/24/2020   Procedure: ENDOVEIN HARVEST OF GREATER SAPHENOUS VEIN;  Surgeon: [name], MD;  Location: [facility];  Service: Open Heart Surgery;  Laterality: Right;   EYE SURGERY     cataract removal bilaterally   FEMORAL-POPLITEAL BYPASS GRAFT Right 08/16/2020   Procedure: BYPASS GRAFT FEMORAL-POPLITEAL ARTERY;  Surgeon: [name], MD;  Location: [facility];  Service: Vascular;  Laterality: Right;   INTRAVASCULAR LITHOTRIPSY  11/25/2019   Procedure: INTRAVASCULAR LITHOTRIPSY;  Surgeon: [name], MD;  Location: [facility];  Service: Cardiovascular;;  LT. SFA   LEFT HEART CATH AND CORONARY ANGIOGRAPHY N/A 03/22/2020   Procedure: LEFT HEART CATH AND CORONARY ANGIOGRAPHY;  Surgeon: Martinique, Peter M, MD;  Location: [facility];  Service: Cardiovascular;  Laterality: N/A;   LOWER EXTREMITY ANGIOGRAPHY Bilateral 06/15/2019   Procedure: Lower Extremity Angiography;  Surgeon: [name], MD;  Location: [place];  Service: Cardiovascular;  Laterality: Bilateral;   LOWER EXTREMITY ANGIOGRAPHY Right 08/03/2019   Procedure: LOWER EXTREMITY ANGIOGRAPHY;  Surgeon: [name],  MD;  Location: [facility];  Service: Cardiovascular;  Laterality: Right;   PATCH ANGIOPLASTY Right 07/21/2019   Procedure: Patch Angioplasty Right Femoral Artery;  Surgeon: [name], MD;  Location: [facility];  Service: Vascular;  Laterality: Right;   PERIPHERAL VASCULAR ATHERECTOMY  08/03/2019   Procedure: PERIPHERAL VASCULAR ATHERECTOMY;  Surgeon: [name], MD;  Location: [place] [facility];  Service: Cardiovascular;;  right SFA, right TP trunk   PERIPHERAL VASCULAR ATHERECTOMY  11/25/2019   Procedure: PERIPHERAL VASCULAR ATHERECTOMY;  Surgeon: [name], MD;  Location: [place];  Service: Cardiovascular;;  Lt.  POPLITEAL and AT   PERIPHERAL VASCULAR BALLOON ANGIOPLASTY Left 06/15/2019   Procedure: PERIPHERAL VASCULAR BALLOON ANGIOPLASTY;  Surgeon: [name], MD;  Location: [facility];  Service: Cardiovascular;  Laterality: Left;  SFA UNSUCCESSFUL UNABLE TO CROSS LESION   PERIPHERAL VASCULAR BALLOON ANGIOPLASTY  08/03/2019   Procedure: PERIPHERAL VASCULAR BALLOON ANGIOPLASTY;  Surgeon: [name], MD;  Location: [facility];  Service: Cardiovascular;;  right SFA, Right TP trunk   TEE WITHOUT CARDIOVERSION N/A 03/24/2020   Procedure: TRANSESOPHAGEAL ECHOCARDIOGRAM (TEE);  Surgeon: [name], MD;  Location: [facility];  Service: Open Heart Surgery;  Laterality: N/A;   TONSILLECTOMY     and adenoidectomy   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/2019   Procedure: TRANSMETATARSAL AMPUTATION;  Surgeon: Trula Slade, DPM;  Location: [facility];  Service: Podiatry;  Laterality: Right;   TUBAL LIGATION      FAMILY HISTORY Family History  Problem Relation Age of Onset   Cancer - Prostate Father    Cancer - [place] Father    Stroke Mother    Hypertension Mother    Hyperlipidemia Mother    Melanoma Brother     SOCIAL HISTORY Social History   Tobacco Use   Smoking status: Former    Pack years: 0.00    Types: Cigarettes    Quit  date: 03/31/1972    Years since quitting: 48.9   Smokeless tobacco: Never  Vaping Use   Vaping Use: Never used  Substance Use Topics   Alcohol use: No   Drug use: No  OPHTHALMIC EXAM:  Base Eye Exam     Visual Acuity (ETDRS)       Right Left   Dist [place] 20/25 +1 20/40   Dist ph [place]  20/25 +1         Tonometry (Tonopen, 8:34 AM)       Right Left   Pressure 17 14         Pupils       Pupils Dark Light Shape React APD   Right PERRL 2 2 Round Minimal None   Left PERRL 2 2 Round Minimal None         Visual Fields (Counting fingers)       Left Right    Full Full         Extraocular Movement       Right Left    Full Full         Neuro/Psych     Oriented x3: Yes   Mood/Affect: Normal         Dilation     Both eyes: 1.0% Mydriacyl, 2.5% Phenylephrine @ 8:34 AM           Slit Lamp and Fundus Exam     External Exam       Right Left   External Normal Normal         Slit Lamp Exam       Right Left   Lids/Lashes Normal Normal   Conjunctiva/Sclera White and quiet White and quiet   Cornea Clear Clear   Anterior Chamber Deep and quiet Deep and quiet   Iris Round and reactive Round and reactive   Lens Centered posterior chamber intraocular lens Centered posterior chamber intraocular lens   Anterior Vitreous Normal Normal         Fundus Exam       Right Left   Posterior Vitreous Posterior vitreous detachment Normal   Disc 1+ Pallor temporal Normal   C/D Ratio 0.0 0.0   Macula no macular thickening, Microaneurysms, no clinically significant macular edema no macular thickening, Microaneurysms, no clinically significant macular edema   Vessels NPDR-moderate to severe NPDR-moderate to severe   Periphery Dot-blot hemorrhage, less active temporally, in region of previous local scatter PRP in region of nonperfusion Normal pigmentary degeneration nasally            IMAGING AND PROCEDURES  Imaging and Procedures for  03/08/21  Color Fundus Photography Optos - OU - Both Eyes       Right Eye Progression has improved. Disc findings include increased cup to disc ratio, pallor. Macula : normal observations.   Left Eye Progression has improved. Disc findings include pallor. Macula : normal observations. Periphery : normal observations, degeneration.   Notes Large dot blot hemorrhages anterior retina inferiorly and temporally region of nonperfusion with severe NPDR OD, good temporal PRP in region of previous nonperfusion.  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 bundle region  Left epiretinal membrane Detected clinically OS but not significant by OCT and no impact on acuity OS  ICD-10-CM   1. 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 Left epiretinal membrane  H35.372       1.  OD with more advanced severe NPDR in the past, yet in the region of significant dot blot hemorrhage deposition with signs clinically of retinal nonperfusion in the temporal watershed region of the peripheral retina, local PRP now has quieted this disease in my opinion has halted vegF release and thus stabilized the peripheral retina observe here after  2.  Moderate to moderate to severe NPDR remains OU will observe  3.  Ophthalmic Meds Ordered this  visit:  No orders of the defined types were placed in this encounter.      Return in about 9 months (around 12/06/2021) for DILATE OU, COLOR FP, OCT.  There are no Patient Instructions on file for this visit.   Explained the diagnoses, plan, and follow up with the patient and they expressed understanding.  Patient expressed understanding of the importance of proper follow up care.   Clent Demark Briel Gallicchio M.D. Diseases & Surgery of the Retina and Vitreous Retina & Diabetic [place] 03/08/21     Abbreviations: M myopia (nearsighted); A astigmatism; H hyperopia (farsighted); P presbyopia; Mrx spectacle prescription;  CTL contact lenses; OD right eye; OS left eye; OU both eyes  XT exotropia; ET esotropia; PEK punctate epithelial keratitis; PEE punctate epithelial erosions; DES dry eye syndrome; MGD meibomian gland dysfunction; ATs artificial tears; PFAT's preservative free artificial tears; [place] nuclear sclerotic cataract; PSC posterior subcapsular cataract; ERM epi-retinal membrane; PVD posterior vitreous detachment; RD retinal detachment; DM diabetes mellitus; DR diabetic retinopathy; NPDR non-proliferative diabetic retinopathy; PDR proliferative diabetic retinopathy; CSME clinically significant macular edema; DME diabetic macular edema; dbh dot blot hemorrhages; CWS cotton wool spot; POAG primary open angle glaucoma; C/D cup-to-disc ratio; HVF humphrey visual field; GVF goldmann visual field; OCT optical coherence tomography; IOP intraocular pressure; BRVO Branch retinal vein occlusion; CRVO central retinal vein occlusion; CRAO central retinal artery occlusion; BRAO branch retinal artery occlusion; RT retinal tear; SB scleral buckle; PPV pars plana vitrectomy; VH Vitreous hemorrhage; PRP panretinal laser photocoagulation; IVK intravitreal kenalog; VMT vitreomacular traction; MH Macular hole;  NVD neovascularization of the disc; NVE neovascularization elsewhere; AREDS age related eye disease study; ARMD  age related macular degeneration; POAG primary open angle glaucoma; EBMD epithelial/anterior basement membrane dystrophy; ACIOL anterior chamber intraocular lens; IOL intraocular lens; PCIOL posterior chamber intraocular lens; Phaco/IOL phacoemulsification with intraocular lens placement; [place] photorefractive keratectomy; LASIK laser assisted in situ keratomileusis; HTN hypertension; DM diabetes mellitus; COPD chronic obstructive pulmonary disease

## 2021-03-15 ENCOUNTER — Encounter (INDEPENDENT_AMBULATORY_CARE_PROVIDER_SITE_OTHER): Payer: Medicare Other | Admitting: Ophthalmology

## 2021-03-29 ENCOUNTER — Other Ambulatory Visit: Payer: Self-pay

## 2021-03-29 ENCOUNTER — Other Ambulatory Visit (INDEPENDENT_AMBULATORY_CARE_PROVIDER_SITE_OTHER): Payer: Medicare Other

## 2021-03-29 DIAGNOSIS — E1165 Type 2 diabetes mellitus with hyperglycemia: Secondary | ICD-10-CM

## 2021-03-29 LAB — BASIC METABOLIC PANEL
BUN: 26 mg/dL — ABNORMAL HIGH (ref 6–23)
CO2: 28 mEq/L (ref 19–32)
Calcium: 10.7 mg/dL — ABNORMAL HIGH (ref 8.4–10.5)
Chloride: 104 mEq/L (ref 96–112)
Creatinine, Ser: 0.98 mg/dL (ref 0.40–1.20)
GFR: 55.08 mL/min — ABNORMAL LOW (ref >60.00)
Glucose, Bld: 163 mg/dL — ABNORMAL HIGH (ref 70–99)
Potassium: 4.6 mEq/L (ref 3.5–5.1)
Sodium: 140 mEq/L (ref 135–145)

## 2021-03-30 LAB — FRUCTOSAMINE: Fructosamine: 237 umol/L (ref 0–285)

## 2021-03-31 ENCOUNTER — Ambulatory Visit (INDEPENDENT_AMBULATORY_CARE_PROVIDER_SITE_OTHER): Payer: Medicare Other | Admitting: Endocrinology

## 2021-03-31 ENCOUNTER — Other Ambulatory Visit: Payer: Self-pay

## 2021-03-31 VITALS — BP 120/68 | HR 82 | Ht 64.5 in | Wt 151.0 lb

## 2021-03-31 DIAGNOSIS — E1165 Type 2 diabetes mellitus with hyperglycemia: Secondary | ICD-10-CM

## 2021-03-31 DIAGNOSIS — I6523 Occlusion and stenosis of bilateral carotid arteries: Secondary | ICD-10-CM | POA: Diagnosis not present

## 2021-03-31 NOTE — Progress Notes (Signed)
Patient ID: [name], female   DOB: [birth year], 80 y.o.   MRN: [id number]          Reason for Appointment: Follow-up for endocrinology problems   History of Present Illness:          Date of diagnosis of type 2 diabetes mellitus: 1984       Background history:   She has been on metformin since about the time of her diagnosis and has been on other drugs also but has not had many changes in her regimen in the last several years She thinks her blood sugar used to be fairly well controlled At some point she was given Byetta for about a year and a half which improved her control but this was subsequently start because of it being too expensive for her.  She had no side effects with this  Recent history:   Her A1c is last 7.7, previously was down to 6.5  Non-insulin hypoglycemic regimen: Amaryl 2 mg in a.m., Invokana 300 mg daily,   metformin 1 g twice daily  Current management, level of control, blood sugar patterns and problems identified:  Fructosamine is 237, improved  She is now on Amaryl instead of glipizide to help with postprandial hyperglycemia  Again difficult to assess her blood sugars accurately because of monitoring mostly in the mornings before or after breakfast  Her blood sugars after breakfast may be higher based on what she is eating and higher with oatmeal However her blood sugar 304 may have been associated with an error code She is still using an old One Touch ultra 2 Her lab glucose was 163 after breakfast  She is currently not exercising She thinks she is generally watching her diet with portions and carbohydrates and her weight is leveled off         Side effects from medications have been: None  Compliance with the medical regimen: Good Hypoglycemia:   None  Glucose monitoring:  About 1 times a day         Glucometer: One Touch.       Blood Glucose readings    PRE-MEAL Fasting Lunch Dinner Bedtime Overall  Glucose range: 65-134    65-304   Mean/median: 99    139   POST-MEAL PC Breakfast PC Lunch PC Dinner  Glucose range: 133-304    Mean/median: 185 127    Previously:  PRE-MEAL Fasting Lunch Dinner Bedtime Overall  Glucose range:  76-122      Mean/median:  98    108   POST-MEAL PC Breakfast PC Lunch PC Dinner  Glucose range:   147, 184   Mean/median:         Self-care: The diet that the patient has been following is: tries to limit sweets and fried food.     Meal times are:  Breakfast is at 8:30 AM lunch: 1 PM, dinner: 6:30 PM  Typical meal intake: Breakfast is yogurt with fruit or egg/toast or oatmeal Trying to get some boost with protein  Lunch is usually soup with crackers, dinner is usually a low-fat meat with vegetables Snacks will be celery and ranch dressing               Dietician visit, most recent: 10/18                Weight history:  Wt Readings from Last 3 Encounters:  03/31/21 151 lb (68.5 kg)  01/23/21 150 lb 9.6 oz (68.3 kg)  12/20/20 144  lb (65.3 kg)    Glycemic control:   Lab Results  Component Value Date   HGBA1C 7.7 (H) 01/18/2021   HGBA1C 6.5 08/30/2020   HGBA1C 7.1 (H) 08/10/2020   Lab Results  Component Value Date   MICROALBUR 1.0 08/30/2020   LDLCALC 33 08/17/2020   CREATININE 0.98 03/29/2021   Lab Results  Component Value Date   MICRALBCREAT 2.0 08/30/2020    Lab Results  Component Value Date   FRUCTOSAMINE 237 03/29/2021   FRUCTOSAMINE 243 03/18/2019   FRUCTOSAMINE 268 10/21/2018      Allergies as of 03/31/2021       Reactions   Bactrim [sulfamethoxazole-trimethoprim] Other (See Comments)   ^ K+( elevated)    Demerol [meperidine]    Delusional    Scopolamine    Delusional    Versed [midazolam] Anxiety   Frantic, out of my mind, agitated         Medication List        Accurate as of March 31, 2021  8:33 AM. If you have any questions, ask your nurse or doctor.          acetaminophen 500 MG tablet Commonly known as: TYLENOL Take 1,000 mg by  mouth every 6 (six) hours as needed for moderate pain or headache.   aspirin EC 81 MG tablet Take 81 mg by mouth at bedtime.   atorvastatin 80 MG tablet Commonly known as: LIPITOR Take 80 mg by mouth daily. Take 1 Tablet Daily   brimonidine 0.2 % ophthalmic solution Commonly known as: ALPHAGAN Place 1 drop into the right eye 2 (two) times daily.   calcium carbonate 500 MG chewable tablet Commonly known as: TUMS - dosed in mg elemental calcium Chew 2 tablets by mouth daily as needed for indigestion or heartburn.   canagliflozin 300 MG Tabs tablet Commonly known as: INVOKANA Take 300 mg by mouth daily before breakfast.   CINNAMON PO Take 2 tablets by mouth in the morning, at noon, and at bedtime.   clopidogrel 75 MG tablet Commonly known as: PLAVIX Take 1 tablet by mouth once daily   [employer] SENIOR PROBIOTIC PO Take 1 capsule by mouth daily with breakfast.   glimepiride 2 MG tablet Commonly known as: AMARYL Take 1 tablet (2 mg total) by mouth daily before breakfast.   hydrochlorothiazide 12.5 MG tablet Commonly known as: HYDRODIURIL Take 1 tablet by mouth once daily   metFORMIN 1000 MG tablet Commonly known as: GLUCOPHAGE Take 1 tablet by mouth twice daily   metoprolol tartrate 25 MG tablet Commonly known as: LOPRESSOR Take 1.5 tablets (37.5 mg total) by mouth 2 (two) times daily.   pantoprazole 40 MG tablet Commonly known as: PROTONIX Take 1 tablet (40 mg total) by mouth daily.   Probiotic 1-250 BILLION-MG Caps See admin instructions.   pyridoxine 100 MG tablet Commonly known as: B-6 Take 100 mg by mouth every evening.   SYSTANE OP Place 1 drop into both eyes 2 (two) times daily as needed (Dry eyes).   telmisartan 80 MG tablet Commonly known as: MICARDIS Take 1 tablet by mouth once daily What changed:  how much to take additional instructions   vitamin B-12 500 MCG tablet Commonly known as: CYANOCOBALAMIN Take 500 mcg by mouth daily.   vitamin C 100  MG tablet Take 100 mg by mouth daily with supper.        Allergies:  Allergies  Allergen Reactions   Bactrim [Sulfamethoxazole-Trimethoprim] Other (See Comments)    ^ K+(  elevated)    Demerol [Meperidine]     Delusional    Scopolamine     Delusional    Versed [Midazolam] Anxiety    Frantic, out of my mind, agitated     Past Medical History:  Diagnosis Date   Anemia    after CABG in june 2021   Arthritis    Cancer ([place])    removal from nose - MOSE procedure    Complication of anesthesia    VERSED- agitated, muscle spasms, "jerking" , frantic , (never had this occurence in the pas)    Coronary artery disease    Diabetes mellitus without complication ([place])    Dysrhythmia    PVC's   GERD (gastroesophageal reflux disease)    Heart murmur    History of hiatal hernia    Hyperlipidemia    Hypertension 11/20/11   ECHO- EF>55% Borderline concentric left ventricular hypertrophy. There is a small calcified mass in the L:A near the LA appendage. No valvular masses seen with associated mitral annular calcification. LA Volume/ BSA27.4 ml/m2 No AS. Right ventricular systolic pressure is elevated at 57mmHg.   Myocardial infarction [facility])    June 2021   Neuromuscular disorder ([place])    neuropathy in bilateral feet   Peripheral vascular disease ([place])    Pneumonia    not hosp.    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  PTCAs with drug coated balloon Lt ant tibial artery and Lt popliteal artery  11/26/2019    Past Surgical History:  Procedure Laterality Date   ABDOMINAL AORTAGRAM  11/25/2019   ABDOMINAL AORTOGRAM W/LOWER EXTREMITY    ABDOMINAL AORTOGRAM N/A 06/15/2019   Procedure: ABDOMINAL AORTOGRAM;  Surgeon: [name], MD;  Location: [place] [facility];  Service: Cardiovascular;  Laterality: N/A;   ABDOMINAL AORTOGRAM W/LOWER EXTREMITY N/A 11/25/2019   Procedure: ABDOMINAL AORTOGRAM W/LOWER EXTREMITY;  Surgeon: [name], MD;  Location: [place] [facility];   Service: Cardiovascular;  Laterality: N/A;  Lt leg   ABDOMINAL AORTOGRAM W/LOWER EXTREMITY N/A 08/01/2020   Procedure: ABDOMINAL AORTOGRAM W/LOWER EXTREMITY;  Surgeon: [name], MD;  Location: [place] [facility];  Service: Cardiovascular;  Laterality: N/A;   APPENDECTOMY     APPLICATION OF WOUND VAC Right 07/21/2019   Procedure: Application Of Prevena Wound Vac Right Groin;  Surgeon: [name], MD;  Location: [facility];  Service: Vascular;  Laterality: Right;   CARDIAC CATHETERIZATION     CARPAL [place]     x 2   COLONOSCOPY     CORONARY ARTERY BYPASS GRAFT N/A 03/24/2020   Procedure: CORONARY ARTERY BYPASS GRAFTING (CABG) using LIMA to LAD (m); RIMA to RAMUS; Endoscopic Right Greater Saphenous Vein: SVG to Diag1; SVG to PLB (right); and SVG to PL (left).;  Surgeon: [name], MD;  Location: [place];  Service: Open Heart Surgery;  Laterality: N/A;  BILATERAL IMA   ENDARTERECTOMY FEMORAL Right 07/21/2019   Procedure: RIGHT ENDARTERECTOMY FEMORAL WITH PATCH ANGIOPLASTY;  Surgeon: [name], MD;  Location: [place];  Service: Vascular;  Laterality: Right;   ENDARTERECTOMY FEMORAL Right 08/16/2020   Procedure: RIGHT FEMORAL ENDARTERECTOMY;  Surgeon: [name], MD;  Location: [place];  Service: Vascular;  Laterality: Right;   ENDOVEIN HARVEST OF GREATER SAPHENOUS VEIN Right 03/24/2020   Procedure: ENDOVEIN HARVEST OF GREATER SAPHENOUS VEIN;  Surgeon: [name], MD;  Location:  [place] OR;  Service: Open Heart Surgery;  Laterality: Right;   EYE SURGERY     cataract removal bilaterally   FEMORAL-POPLITEAL BYPASS GRAFT Right 08/16/2020   Procedure: BYPASS GRAFT FEMORAL-POPLITEAL ARTERY;  Surgeon: [name], MD;  Location: [place];  Service: Vascular;  Laterality: Right;   INTRAVASCULAR LITHOTRIPSY  11/25/2019   Procedure: INTRAVASCULAR LITHOTRIPSY;  Surgeon: [name], MD;  Location: [place] [facility];  Service: Cardiovascular;;  LT. SFA   LEFT HEART CATH AND CORONARY ANGIOGRAPHY N/A 03/22/2020   Procedure: LEFT HEART CATH AND CORONARY ANGIOGRAPHY;  Surgeon: Martinique, Peter M, MD;  Location: [place] [facility];  Service: Cardiovascular;  Laterality: N/A;   LOWER EXTREMITY ANGIOGRAPHY Bilateral 06/15/2019   Procedure: Lower Extremity Angiography;  Surgeon: [name], MD;  Location: [place] [facility];  Service: Cardiovascular;  Laterality: Bilateral;   LOWER EXTREMITY ANGIOGRAPHY Right 08/03/2019   Procedure: LOWER EXTREMITY ANGIOGRAPHY;  Surgeon: [name], MD;  Location: [place] [facility];  Service: Cardiovascular;  Laterality: Right;   PATCH ANGIOPLASTY Right 07/21/2019   Procedure: Patch Angioplasty Right Femoral Artery;  Surgeon: [name], MD;  Location: [place];  Service: Vascular;  Laterality: Right;   PERIPHERAL VASCULAR ATHERECTOMY  08/03/2019   Procedure: PERIPHERAL VASCULAR ATHERECTOMY;  Surgeon: [name], MD;  Location: [place] [facility];  Service: Cardiovascular;;  right SFA, right TP trunk   PERIPHERAL VASCULAR ATHERECTOMY  11/25/2019   Procedure: PERIPHERAL VASCULAR ATHERECTOMY;  Surgeon: [name], MD;  Location: [place] [facility];  Service: Cardiovascular;;  Lt.  POPLITEAL and AT   PERIPHERAL VASCULAR BALLOON ANGIOPLASTY Left 06/15/2019   Procedure: PERIPHERAL VASCULAR BALLOON ANGIOPLASTY;  Surgeon: [name], MD;  Location: [place] [facility];  Service: Cardiovascular;  Laterality: Left;  SFA UNSUCCESSFUL UNABLE TO CROSS LESION   PERIPHERAL VASCULAR BALLOON ANGIOPLASTY  08/03/2019   Procedure: PERIPHERAL VASCULAR BALLOON ANGIOPLASTY;  Surgeon: [name], MD;  Location: [place] [facility];  Service: Cardiovascular;;  right SFA, Right TP trunk   TEE WITHOUT CARDIOVERSION N/A 03/24/2020   Procedure: TRANSESOPHAGEAL ECHOCARDIOGRAM (TEE);  Surgeon: [name], MD;  Location: [place];  Service: Open Heart Surgery;   Laterality: N/A;   TONSILLECTOMY     and adenoidectomy   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 She has never used smokeless tobacco. She reports that she does not drink alcohol and does not use drugs.   Review of Systems   Lipid history: Hypercholesterolemia by PCP with 20 mg Lipitor    Lab Results  Component Value Date   CHOL 77 08/17/2020   HDL 31 (L) 08/17/2020   LDLCALC 33 08/17/2020   TRIG 63 08/17/2020   CHOLHDL 2.5 08/17/2020           Hypertension: Present for several years, currently treated with  Micardis 40 mg, metoprolol, diltiazem and HCTZ, also followed by cardiologist  She monitors at home Blood pressure appears to be generally well  controlled  BP Readings from Last 3 Encounters:  03/31/21 120/68  01/23/21 130/86  12/20/20 136/62   No recent change in renal function  Lab Results  Component Value Date   CREATININE 0.98 03/29/2021   CREATININE 1.18 01/18/2021   CREATININE 0.95 08/30/2020     Most recent eye exam was in 4/21    Lab Results  Component Value Date   HGB 7.0 (L) 08/18/2020     She has had significant PAD followed by vascular cardiologist   LABS:  Lab on 03/29/2021  Component Date Value Ref Range Status   Fructosamine 03/29/2021 237  0 - 285 umol/L Final   Comment: Published reference interval for apparently healthy subjects between age 66 and 37 is 72 - 285 umol/L and in a poorly controlled diabetic population is 228 - 563 umol/L with a mean of 396 umol/L.    Sodium 03/29/2021 140  135 - 145 mEq/L Final    Potassium 03/29/2021 4.6  3.5 - 5.1 mEq/L Final   Chloride 03/29/2021 104  96 - 112 mEq/L Final   CO2 03/29/2021 28  19 - 32 mEq/L Final   Glucose, Bld 03/29/2021 163 (A) 70 - 99 mg/dL Final   BUN 03/29/2021 26 (A) 6 - 23 mg/dL Final   Creatinine, Ser 03/29/2021 0.98  0.40 - 1.20 mg/dL Final   GFR 03/29/2021 55.08 (A) >60.00 mL/min Final   Calculated using the CKD-EPI Creatinine Equation (2021)   Calcium 03/29/2021 10.7 (A) 8.4 - 10.5 mg/dL Final    Physical Examination:  BP 120/68   Pulse 82   Ht 5' 4.5" (1.638 m)   Wt 151 lb (68.5 kg)   SpO2 98%   BMI 25.52 kg/m       ASSESSMENT:  Diabetes type 2  See history of present illness for detailed discussion of current diabetes management, blood sugar patterns and problems identified  Her A1c is last higher at 7.7 Fructosamine of 237 indicates overall good control  Her weight gain has slowed down She is now taking Amaryl instead of glipizide along with her metformin and Invokana  Only at times will have high readings after breakfast but no readings of postprandial values available after dinner  PLAN:    Cut back on the evening metformin to 500 mg to help reduce tendency to low normal fasting readings However she will let us know if she starts getting any hypoglycemia To also check blood sugars after lunch and dinner and let us know if they are consistently high She is trying to exercise with water aerobics and chair exercises  Periodically follow-up calcium levels as she has not had any hypercalcemia previously   There are no Patient Instructions on file for this visit.   Elayne Snare 03/31/2021, 8:33 AM   Note: This office note was prepared with Dragon voice recognition system technology. Any transcriptional errors that result from this process are unintentional.

## 2021-03-31 NOTE — Patient Instructions (Addendum)
Metformin 1/2 at dinner  Check blood sugars on waking up 3 days a week  Also check blood sugars about 2 hours after meals and do this after different meals by rotation  Recommended blood sugar levels on waking up are 80-120 and about 2 hours after meal is 130-160  Please bring your blood sugar monitor to each visit, thank you

## 2021-04-19 ENCOUNTER — Other Ambulatory Visit: Payer: Self-pay | Admitting: Cardiovascular Disease

## 2021-04-19 ENCOUNTER — Telehealth: Payer: Self-pay | Admitting: Endocrinology

## 2021-04-19 DIAGNOSIS — E1165 Type 2 diabetes mellitus with hyperglycemia: Secondary | ICD-10-CM

## 2021-04-19 MED ORDER — ONETOUCH ULTRASOFT LANCETS MISC: 12 refills | Status: DC

## 2021-04-19 MED ORDER — ONETOUCH VERIO VI STRP: ORAL_STRIP | 12 refills | Status: DC

## 2021-04-19 NOTE — Telephone Encounter (Signed)
Patient needs One Touch Verio flex testing strips and lancets sent to [facility]This is a new Rx for patient. She was advised to call once she finished old testing device

## 2021-04-24 ENCOUNTER — Other Ambulatory Visit: Payer: Self-pay

## 2021-04-24 DIAGNOSIS — E1165 Type 2 diabetes mellitus with hyperglycemia: Secondary | ICD-10-CM

## 2021-04-24 MED ORDER — ONETOUCH VERIO VI STRP: ORAL_STRIP | 5 refills | Status: DC

## 2021-04-24 MED ORDER — ONETOUCH DELICA PLUS LANCET33G MISC: 5 refills | Status: DC

## 2021-04-27 ENCOUNTER — Other Ambulatory Visit: Payer: Self-pay

## 2021-04-27 ENCOUNTER — Encounter (HOSPITAL_COMMUNITY): Payer: Self-pay | Admitting: Emergency Medicine

## 2021-04-27 ENCOUNTER — Inpatient Hospital Stay (HOSPITAL_COMMUNITY)
Admission: EM | Admit: 2021-04-27 | Discharge: 2021-05-15 | DRG: 853 | Disposition: A | Discharge status: Home Health | Payer: Medicare Other | Attending: Family Medicine | Admitting: Family Medicine

## 2021-04-27 ENCOUNTER — Emergency Department (HOSPITAL_COMMUNITY): Payer: Medicare Other

## 2021-04-27 DIAGNOSIS — M86172 Other acute osteomyelitis, left ankle and foot: Secondary | ICD-10-CM | POA: Diagnosis not present

## 2021-04-27 DIAGNOSIS — Z95820 Peripheral vascular angioplasty status with implants and grafts: Secondary | ICD-10-CM | POA: Diagnosis not present

## 2021-04-27 DIAGNOSIS — L03116 Cellulitis of left lower limb: Secondary | ICD-10-CM | POA: Diagnosis not present

## 2021-04-27 DIAGNOSIS — J90 Pleural effusion, not elsewhere classified: Secondary | ICD-10-CM | POA: Diagnosis not present

## 2021-04-27 DIAGNOSIS — R000 Tachycardia, unspecified: Secondary | ICD-10-CM | POA: Diagnosis not present

## 2021-04-27 DIAGNOSIS — I70229 Atherosclerosis of native arteries of extremities with rest pain, unspecified extremity: Secondary | ICD-10-CM | POA: Diagnosis not present

## 2021-04-27 DIAGNOSIS — I739 Peripheral vascular disease, unspecified: Secondary | ICD-10-CM | POA: Diagnosis not present

## 2021-04-27 DIAGNOSIS — E11649 Type 2 diabetes mellitus with hypoglycemia without coma: Secondary | ICD-10-CM | POA: Diagnosis not present

## 2021-04-27 DIAGNOSIS — D638 Anemia in other chronic diseases classified elsewhere: Secondary | ICD-10-CM | POA: Diagnosis present

## 2021-04-27 DIAGNOSIS — Z89431 Acquired absence of right foot: Secondary | ICD-10-CM | POA: Diagnosis not present

## 2021-04-27 DIAGNOSIS — Z823 Family history of stroke: Secondary | ICD-10-CM

## 2021-04-27 DIAGNOSIS — Z808 Family history of malignant neoplasm of other organs or systems: Secondary | ICD-10-CM

## 2021-04-27 DIAGNOSIS — N186 End stage renal disease: Secondary | ICD-10-CM | POA: Diagnosis not present

## 2021-04-27 DIAGNOSIS — M86072 Acute hematogenous osteomyelitis, left ankle and foot: Secondary | ICD-10-CM

## 2021-04-27 DIAGNOSIS — L03119 Cellulitis of unspecified part of limb: Secondary | ICD-10-CM

## 2021-04-27 DIAGNOSIS — I252 Old myocardial infarction: Secondary | ICD-10-CM

## 2021-04-27 DIAGNOSIS — N1831 Chronic kidney disease, stage 3a: Secondary | ICD-10-CM | POA: Diagnosis present

## 2021-04-27 DIAGNOSIS — D631 Anemia in chronic kidney disease: Secondary | ICD-10-CM | POA: Diagnosis not present

## 2021-04-27 DIAGNOSIS — R0689 Other abnormalities of breathing: Secondary | ICD-10-CM

## 2021-04-27 DIAGNOSIS — I745 Embolism and thrombosis of iliac artery: Secondary | ICD-10-CM | POA: Diagnosis not present

## 2021-04-27 DIAGNOSIS — Z7984 Long term (current) use of oral hypoglycemic drugs: Secondary | ICD-10-CM | POA: Diagnosis not present

## 2021-04-27 DIAGNOSIS — Z7982 Long term (current) use of aspirin: Secondary | ICD-10-CM

## 2021-04-27 DIAGNOSIS — E1152 Type 2 diabetes mellitus with diabetic peripheral angiopathy with gangrene: Secondary | ICD-10-CM | POA: Diagnosis present

## 2021-04-27 DIAGNOSIS — A4101 Sepsis due to Methicillin susceptible Staphylococcus aureus: Secondary | ICD-10-CM | POA: Diagnosis not present

## 2021-04-27 DIAGNOSIS — U071 COVID-19: Secondary | ICD-10-CM | POA: Diagnosis present

## 2021-04-27 DIAGNOSIS — Z7902 Long term (current) use of antithrombotics/antiplatelets: Secondary | ICD-10-CM

## 2021-04-27 DIAGNOSIS — Z79899 Other long term (current) drug therapy: Secondary | ICD-10-CM

## 2021-04-27 DIAGNOSIS — R0989 Other specified symptoms and signs involving the circulatory and respiratory systems: Secondary | ICD-10-CM

## 2021-04-27 DIAGNOSIS — I1310 Hypertensive heart and chronic kidney disease without heart failure, with stage 1 through stage 4 chronic kidney disease, or unspecified chronic kidney disease: Secondary | ICD-10-CM | POA: Diagnosis present

## 2021-04-27 DIAGNOSIS — S91102A Unspecified open wound of left great toe without damage to nail, initial encounter: Secondary | ICD-10-CM | POA: Diagnosis not present

## 2021-04-27 DIAGNOSIS — N189 Chronic kidney disease, unspecified: Secondary | ICD-10-CM

## 2021-04-27 DIAGNOSIS — I70262 Atherosclerosis of native arteries of extremities with gangrene, left leg: Secondary | ICD-10-CM | POA: Diagnosis present

## 2021-04-27 DIAGNOSIS — N179 Acute kidney failure, unspecified: Secondary | ICD-10-CM | POA: Diagnosis present

## 2021-04-27 DIAGNOSIS — E1142 Type 2 diabetes mellitus with diabetic polyneuropathy: Secondary | ICD-10-CM | POA: Diagnosis present

## 2021-04-27 DIAGNOSIS — E119 Type 2 diabetes mellitus without complications: Secondary | ICD-10-CM | POA: Diagnosis not present

## 2021-04-27 DIAGNOSIS — T8754 Necrosis of amputation stump, left lower extremity: Secondary | ICD-10-CM | POA: Diagnosis not present

## 2021-04-27 DIAGNOSIS — E11628 Type 2 diabetes mellitus with other skin complications: Secondary | ICD-10-CM

## 2021-04-27 DIAGNOSIS — M1990 Unspecified osteoarthritis, unspecified site: Secondary | ICD-10-CM | POA: Diagnosis present

## 2021-04-27 DIAGNOSIS — M7732 Calcaneal spur, left foot: Secondary | ICD-10-CM | POA: Diagnosis not present

## 2021-04-27 DIAGNOSIS — I708 Atherosclerosis of other arteries: Secondary | ICD-10-CM | POA: Diagnosis present

## 2021-04-27 DIAGNOSIS — I10 Essential (primary) hypertension: Secondary | ICD-10-CM | POA: Diagnosis not present

## 2021-04-27 DIAGNOSIS — E1122 Type 2 diabetes mellitus with diabetic chronic kidney disease: Secondary | ICD-10-CM | POA: Diagnosis present

## 2021-04-27 DIAGNOSIS — E785 Hyperlipidemia, unspecified: Secondary | ICD-10-CM | POA: Diagnosis not present

## 2021-04-27 DIAGNOSIS — I2510 Atherosclerotic heart disease of native coronary artery without angina pectoris: Secondary | ICD-10-CM | POA: Diagnosis present

## 2021-04-27 DIAGNOSIS — I96 Gangrene, not elsewhere classified: Secondary | ICD-10-CM | POA: Diagnosis not present

## 2021-04-27 DIAGNOSIS — E860 Dehydration: Secondary | ICD-10-CM | POA: Diagnosis present

## 2021-04-27 DIAGNOSIS — E1169 Type 2 diabetes mellitus with other specified complication: Secondary | ICD-10-CM | POA: Diagnosis not present

## 2021-04-27 DIAGNOSIS — E1165 Type 2 diabetes mellitus with hyperglycemia: Secondary | ICD-10-CM | POA: Diagnosis not present

## 2021-04-27 DIAGNOSIS — D509 Iron deficiency anemia, unspecified: Secondary | ICD-10-CM | POA: Diagnosis present

## 2021-04-27 DIAGNOSIS — R339 Retention of urine, unspecified: Secondary | ICD-10-CM | POA: Diagnosis not present

## 2021-04-27 DIAGNOSIS — R918 Other nonspecific abnormal finding of lung field: Secondary | ICD-10-CM | POA: Diagnosis not present

## 2021-04-27 DIAGNOSIS — K219 Gastro-esophageal reflux disease without esophagitis: Secondary | ICD-10-CM | POA: Diagnosis present

## 2021-04-27 DIAGNOSIS — E871 Hypo-osmolality and hyponatremia: Secondary | ICD-10-CM | POA: Diagnosis present

## 2021-04-27 DIAGNOSIS — D649 Anemia, unspecified: Secondary | ICD-10-CM | POA: Diagnosis present

## 2021-04-27 DIAGNOSIS — E11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: Secondary | ICD-10-CM | POA: Diagnosis not present

## 2021-04-27 DIAGNOSIS — Z951 Presence of aortocoronary bypass graft: Secondary | ICD-10-CM

## 2021-04-27 DIAGNOSIS — Z87891 Personal history of nicotine dependence: Secondary | ICD-10-CM

## 2021-04-27 DIAGNOSIS — R7989 Other specified abnormal findings of blood chemistry: Secondary | ICD-10-CM | POA: Diagnosis not present

## 2021-04-27 DIAGNOSIS — Z83438 Family history of other disorder of lipoprotein metabolism and other lipidemia: Secondary | ICD-10-CM

## 2021-04-27 DIAGNOSIS — Z8249 Family history of ischemic heart disease and other diseases of the circulatory system: Secondary | ICD-10-CM

## 2021-04-27 DIAGNOSIS — I214 Non-ST elevation (NSTEMI) myocardial infarction: Secondary | ICD-10-CM | POA: Diagnosis not present

## 2021-04-27 HISTORY — DX: Type 2 diabetes mellitus with other skin complications: L03.119

## 2021-04-27 HISTORY — DX: Other acute osteomyelitis, left ankle and foot: M86.172

## 2021-04-27 HISTORY — DX: Type 2 diabetes mellitus with other skin complications: E11.628

## 2021-04-27 LAB — CBC WITH DIFFERENTIAL/PLATELET
Abs Immature Granulocytes: 0.19 10*3/uL — ABNORMAL HIGH (ref 0.00–0.07)
Basophils Absolute: 0.1 10*3/uL (ref 0.0–0.1)
Basophils Relative: 0 %
Eosinophils Absolute: 0 10*3/uL (ref 0.0–0.5)
Eosinophils Relative: 0 %
HCT: 32.5 % — ABNORMAL LOW (ref 36.0–46.0)
Hemoglobin: 10.5 g/dL — ABNORMAL LOW (ref 12.0–15.0)
Immature Granulocytes: 1 %
Lymphocytes Relative: 6 %
Lymphs Abs: 1.2 10*3/uL (ref 0.7–4.0)
MCH: 29 pg (ref 26.0–34.0)
MCHC: 32.3 g/dL (ref 30.0–36.0)
MCV: 89.8 fL (ref 80.0–100.0)
Monocytes Absolute: 1.9 10*3/uL — ABNORMAL HIGH (ref 0.1–1.0)
Monocytes Relative: 9 %
Neutro Abs: 17.4 10*3/uL — ABNORMAL HIGH (ref 1.7–7.7)
Neutrophils Relative %: 84 %
Platelets: 238 10*3/uL (ref 150–400)
RBC: 3.62 MIL/uL — ABNORMAL LOW (ref 3.87–5.11)
RDW: 13.6 % (ref 11.5–15.5)
WBC: 20.8 10*3/uL — ABNORMAL HIGH (ref 4.0–10.5)
nRBC: 0 % (ref 0.0–0.2)

## 2021-04-27 LAB — RESP PANEL BY RT-PCR (FLU A&B, COVID) ARPGX2
Influenza A by PCR: NEGATIVE
Influenza B by PCR: NEGATIVE
SARS Coronavirus 2 by RT PCR: POSITIVE — AB

## 2021-04-27 LAB — C-REACTIVE PROTEIN: CRP: 27.3 mg/dL — ABNORMAL HIGH (ref <1.0)

## 2021-04-27 LAB — BASIC METABOLIC PANEL
Anion gap: 13 (ref 5–15)
BUN: 37 mg/dL — ABNORMAL HIGH (ref 8–23)
CO2: 22 mmol/L (ref 22–32)
Calcium: 9.4 mg/dL (ref 8.9–10.3)
Chloride: 97 mmol/L — ABNORMAL LOW (ref 98–111)
Creatinine, Ser: 1.41 mg/dL — ABNORMAL HIGH (ref 0.44–1.00)
GFR, Estimated: 38 mL/min — ABNORMAL LOW (ref >60)
Glucose, Bld: 165 mg/dL — ABNORMAL HIGH (ref 70–99)
Potassium: 3.8 mmol/L (ref 3.5–5.1)
Sodium: 132 mmol/L — ABNORMAL LOW (ref 135–145)

## 2021-04-27 LAB — LACTIC ACID, PLASMA
Lactic Acid, Venous: 1.8 mmol/L (ref 0.5–1.9)
Lactic Acid, Venous: 1.7 mmol/L (ref 0.5–1.9)

## 2021-04-27 LAB — CBG MONITORING, ED: Glucose-Capillary: 82 mg/dL (ref 70–99)

## 2021-04-27 LAB — SEDIMENTATION RATE: Sed Rate: 51 mm/hr — ABNORMAL HIGH (ref 0–22)

## 2021-04-27 LAB — GLUCOSE, CAPILLARY: Glucose-Capillary: 147 mg/dL — ABNORMAL HIGH (ref 70–99)

## 2021-04-27 LAB — PROTIME-INR
INR: 1.1 (ref 0.8–1.2)
Prothrombin Time: 14.3 seconds (ref 11.4–15.2)

## 2021-04-27 LAB — HEMOGLOBIN A1C
Hgb A1c MFr Bld: 7.2 % — ABNORMAL HIGH (ref 4.8–5.6)
Mean Plasma Glucose: 159.94 mg/dL

## 2021-04-27 LAB — PREALBUMIN: Prealbumin: 9.4 mg/dL — ABNORMAL LOW (ref 18–38)

## 2021-04-27 MED ORDER — PIPERACILLIN-TAZOBACTAM 3.375 G IVPB 30 MIN
3.3750 g | Freq: Three times a day (TID) | INTRAVENOUS | Status: DC
  Administered 2021-04-28 – 2021-05-04 (×19): 3.375 g via INTRAVENOUS
  Filled 2021-04-27 (×37): qty 50

## 2021-04-27 MED ORDER — SODIUM CHLORIDE 0.9 % IV SOLN: INTRAVENOUS | Status: DC

## 2021-04-27 MED ORDER — SODIUM CHLORIDE 0.9 % IV SOLN: INTRAVENOUS | Status: AC

## 2021-04-27 MED ORDER — PIPERACILLIN-TAZOBACTAM 3.375 G IVPB 30 MIN
3.3750 g | Freq: Once | INTRAVENOUS | Status: AC
  Administered 2021-04-27: 3.375 g via INTRAVENOUS
  Filled 2021-04-27: qty 50

## 2021-04-27 MED ORDER — CLOPIDOGREL BISULFATE 75 MG PO TABS
75.0000 mg | ORAL_TABLET | Freq: Every day | ORAL | Status: DC
  Administered 2021-04-28 – 2021-04-29 (×2): 75 mg via ORAL
  Filled 2021-04-27 (×2): qty 1

## 2021-04-27 MED ORDER — BRIMONIDINE TARTRATE 0.2 % OP SOLN
1.0000 [drp] | Freq: Two times a day (BID) | OPHTHALMIC | Status: DC
  Administered 2021-04-27 – 2021-05-15 (×35): 1 [drp] via OPHTHALMIC
  Filled 2021-04-27 (×4): qty 5

## 2021-04-27 MED ORDER — VANCOMYCIN HCL 1250 MG/250ML IV SOLN
1250.0000 mg | Freq: Once | INTRAVENOUS | Status: AC
  Administered 2021-04-27: 1250 mg via INTRAVENOUS
  Filled 2021-04-27: qty 250

## 2021-04-27 MED ORDER — ENOXAPARIN SODIUM 40 MG/0.4ML IJ SOSY
40.0000 mg | PREFILLED_SYRINGE | Freq: Every day | INTRAMUSCULAR | Status: DC
  Administered 2021-04-29 – 2021-05-01 (×3): 40 mg via SUBCUTANEOUS
  Filled 2021-04-27 (×3): qty 0.4

## 2021-04-27 MED ORDER — ASCORBIC ACID 500 MG PO TABS
250.0000 mg | ORAL_TABLET | Freq: Every day | ORAL | Status: DC
  Administered 2021-04-29 – 2021-05-14 (×15): 250 mg via ORAL
  Filled 2021-04-27 (×14): qty 1

## 2021-04-27 MED ORDER — PANTOPRAZOLE SODIUM 40 MG PO TBEC
40.0000 mg | DELAYED_RELEASE_TABLET | ORAL | Status: DC
  Administered 2021-04-28 – 2021-05-15 (×8): 40 mg via ORAL
  Filled 2021-04-27 (×6): qty 1

## 2021-04-27 MED ORDER — INSULIN ASPART 100 UNIT/ML IJ SOLN
0.0000 [IU] | Freq: Three times a day (TID) | INTRAMUSCULAR | Status: DC
  Administered 2021-04-29: 2 [IU] via SUBCUTANEOUS
  Administered 2021-04-29: 3 [IU] via SUBCUTANEOUS
  Administered 2021-04-30: 2 [IU] via SUBCUTANEOUS
  Administered 2021-04-30: 8 [IU] via SUBCUTANEOUS
  Administered 2021-05-01: 3 [IU] via SUBCUTANEOUS
  Administered 2021-05-01: 5 [IU] via SUBCUTANEOUS
  Administered 2021-05-02: 3 [IU] via SUBCUTANEOUS
  Administered 2021-05-02 – 2021-05-03 (×2): 2 [IU] via SUBCUTANEOUS
  Administered 2021-05-04: 8 [IU] via SUBCUTANEOUS
  Administered 2021-05-04 (×2): 5 [IU] via SUBCUTANEOUS
  Administered 2021-05-05: 3 [IU] via SUBCUTANEOUS
  Administered 2021-05-05 (×2): 5 [IU] via SUBCUTANEOUS
  Administered 2021-05-06 – 2021-05-07 (×4): 3 [IU] via SUBCUTANEOUS
  Administered 2021-05-07: 5 [IU] via SUBCUTANEOUS
  Administered 2021-05-07 – 2021-05-08 (×3): 3 [IU] via SUBCUTANEOUS
  Administered 2021-05-08 – 2021-05-09 (×2): 5 [IU] via SUBCUTANEOUS
  Administered 2021-05-09 – 2021-05-10 (×5): 3 [IU] via SUBCUTANEOUS
  Administered 2021-05-11: 2 [IU] via SUBCUTANEOUS
  Administered 2021-05-11: 3 [IU] via SUBCUTANEOUS
  Administered 2021-05-12 (×2): 2 [IU] via SUBCUTANEOUS
  Administered 2021-05-12 – 2021-05-14 (×5): 3 [IU] via SUBCUTANEOUS
  Administered 2021-05-14: 2 [IU] via SUBCUTANEOUS
  Administered 2021-05-14 – 2021-05-15 (×2): 3 [IU] via SUBCUTANEOUS
  Administered 2021-05-15: 2 [IU] via SUBCUTANEOUS

## 2021-04-27 MED ORDER — VITAMIN B-6 100 MG PO TABS
100.0000 mg | ORAL_TABLET | Freq: Every evening | ORAL | Status: DC
  Administered 2021-04-28 – 2021-05-14 (×16): 100 mg via ORAL
  Filled 2021-04-27 (×20): qty 1

## 2021-04-27 MED ORDER — SACCHAROMYCES BOULARDII 250 MG PO CAPS
250.0000 mg | ORAL_CAPSULE | Freq: Two times a day (BID) | ORAL | Status: DC
  Administered 2021-04-27 – 2021-05-15 (×36): 250 mg via ORAL
  Filled 2021-04-27 (×36): qty 1

## 2021-04-27 MED ORDER — METOPROLOL TARTRATE 25 MG PO TABS
37.5000 mg | ORAL_TABLET | Freq: Two times a day (BID) | ORAL | Status: DC
  Administered 2021-04-27 – 2021-05-15 (×34): 37.5 mg via ORAL
  Filled 2021-04-27 (×8): qty 1
  Filled 2021-04-27: qty 2
  Filled 2021-04-27 (×7): qty 1
  Filled 2021-04-27: qty 2
  Filled 2021-04-27 (×18): qty 1

## 2021-04-27 MED ORDER — ACETAMINOPHEN 325 MG PO TABS
650.0000 mg | ORAL_TABLET | Freq: Four times a day (QID) | ORAL | Status: DC | PRN
  Administered 2021-04-28 – 2021-05-08 (×8): 650 mg via ORAL
  Filled 2021-04-27 (×6): qty 2

## 2021-04-27 MED ORDER — ATORVASTATIN CALCIUM 80 MG PO TABS
80.0000 mg | ORAL_TABLET | Freq: Every day | ORAL | Status: DC
  Administered 2021-04-28 – 2021-05-15 (×18): 80 mg via ORAL
  Filled 2021-04-27 (×19): qty 1

## 2021-04-27 MED ORDER — POLYETHYL GLYCOL-PROPYL GLYCOL 0.4-0.3 % OP GEL: Freq: Two times a day (BID) | OPHTHALMIC | Status: DC | PRN

## 2021-04-27 MED ORDER — ASPIRIN EC 81 MG PO TBEC
81.0000 mg | DELAYED_RELEASE_TABLET | Freq: Every day | ORAL | Status: DC
  Administered 2021-04-27 – 2021-05-14 (×18): 81 mg via ORAL
  Filled 2021-04-27 (×16): qty 1

## 2021-04-27 MED ORDER — MUPIROCIN 2 % EX OINT
1.0000 "application " | TOPICAL_OINTMENT | Freq: Two times a day (BID) | CUTANEOUS | Status: AC
  Administered 2021-04-27 – 2021-05-02 (×10): 1 via NASAL
  Filled 2021-04-27 (×3): qty 22

## 2021-04-27 MED ORDER — ONDANSETRON HCL 4 MG PO TABS: 4.0000 mg | ORAL_TABLET | Freq: Four times a day (QID) | ORAL | Status: DC | PRN

## 2021-04-27 MED ORDER — ALBUTEROL SULFATE (2.5 MG/3ML) 0.083% IN NEBU
2.5000 mg | INHALATION_SOLUTION | Freq: Four times a day (QID) | RESPIRATORY_TRACT | Status: DC | PRN
  Administered 2021-05-13: 2.5 mg via RESPIRATORY_TRACT
  Filled 2021-04-27: qty 3

## 2021-04-27 MED ORDER — ONDANSETRON HCL 4 MG/2ML IJ SOLN: 4.0000 mg | Freq: Four times a day (QID) | INTRAMUSCULAR | Status: DC | PRN

## 2021-04-27 MED ORDER — ACETAMINOPHEN 650 MG RE SUPP: 650.0000 mg | Freq: Four times a day (QID) | RECTAL | Status: DC | PRN

## 2021-04-27 MED ORDER — VANCOMYCIN VARIABLE DOSE PER UNSTABLE RENAL FUNCTION (PHARMACIST DOSING): Status: DC

## 2021-04-27 MED ORDER — SODIUM CHLORIDE 0.9% FLUSH
3.0000 mL | Freq: Two times a day (BID) | INTRAVENOUS | Status: DC
Start: 2021-04-27 — End: 2021-05-03
  Administered 2021-04-27 – 2021-05-02 (×8): 3 mL via INTRAVENOUS

## 2021-04-27 NOTE — ED Provider Notes (Signed)
ATTENDING SUPERVISORY NOTE I have personally viewed the imaging studies performed. I have personally seen and examined the patient, and discussed the plan of care with the resident.  I have reviewed the documentation of the resident and agree.  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  Critical care was necessary to treat or prevent imminent or life-threatening deterioration.  Critical care was time spent personally by me on the following activities: development of treatment plan with patient and/or surrogate as well as nursing, discussions with consultants, evaluation of patient's response to treatment, examination of patient, obtaining history from patient or surrogate, ordering and performing treatments and interventions, ordering and review of laboratory studies, ordering and review of radiographic studies, pulse oximetry and re-evaluation of patient's condition.    Elnora Morrison, MD 04/28/21 [id number]

## 2021-04-27 NOTE — Consult Note (Signed)
Hospital Consult    Reason for Consult: Left third toe osteomyelitis Referring Physician: Dr. Tamala Julian MRN #:  [id number]  History of Present Illness: This is a 80 y.o. female extensive past vascular history followed by Dr. Gwenlyn Found.  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  She is wearing a dressing overlying it to protect her foot.  She also has previous attempted crossing of left SFA which was unsuccessful.  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  Injury occurred a few nights ago as low-grade trauma and has progressed quickly.  She denies any fevers or chills.  She was evaluated by her primary care doctor today who sent her immediately to the emergency department.  She has hypertension, diabetes as risk factors.  She is on aspirin, Plavix and a statin.  Past Medical History:  Diagnosis Date   Anemia    after CABG in june 2021   Arthritis    Cancer ([place])    removal from nose - MOSE procedure    Complication of anesthesia    VERSED- agitated, muscle spasms, "jerking" , frantic , (never had this occurence in the pas)    Coronary artery disease    Diabetes mellitus without complication ([place])    Dysrhythmia    PVC's   GERD (gastroesophageal reflux disease)    Heart murmur    History of hiatal hernia    Hyperlipidemia    Hypertension 11/20/11   ECHO- EF>55% Borderline concentric left ventricular hypertrophy. There is a small calcified mass in the L:A near the LA appendage. No valvular masses seen with associated mitral annular calcification. LA Volume/ BSA27.4 ml/m2 No AS. Right ventricular systolic pressure is elevated at 4mmHg.   Myocardial infarction [facility])    June 2021   Neuromuscular disorder ([place])    neuropathy in bilateral feet   Peripheral vascular disease ([place])    Pneumonia    not hosp.    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  PTCAs with drug coated balloon Lt ant  tibial artery and Lt popliteal artery  11/26/2019    Past Surgical History:  Procedure Laterality Date   ABDOMINAL AORTAGRAM  11/25/2019   ABDOMINAL AORTOGRAM W/LOWER EXTREMITY    ABDOMINAL AORTOGRAM N/A 06/15/2019   Procedure: ABDOMINAL AORTOGRAM;  Surgeon: [name], MD;  Location: [place] [facility];  Service: Cardiovascular;  Laterality: N/A;   ABDOMINAL AORTOGRAM W/LOWER EXTREMITY N/A 11/25/2019   Procedure: ABDOMINAL AORTOGRAM W/LOWER EXTREMITY;  Surgeon: [name], MD;  Location: [place] [facility];  Service: Cardiovascular;  Laterality: N/A;  Lt leg   ABDOMINAL AORTOGRAM W/LOWER EXTREMITY N/A 08/01/2020   Procedure: ABDOMINAL AORTOGRAM W/LOWER EXTREMITY;  Surgeon: [name], MD;  Location: [place] [facility];  Service: Cardiovascular;  Laterality: N/A;   APPENDECTOMY     APPLICATION OF WOUND VAC Right 07/21/2019   Procedure: Application Of Prevena Wound Vac Right Groin;  Surgeon: [name], MD;  Location: [facility];  Service: Vascular;  Laterality: Right;   CARDIAC CATHETERIZATION     CARPAL [facility]     x 2   COLONOSCOPY     CORONARY ARTERY BYPASS GRAFT N/A 03/24/2020   Procedure: CORONARY ARTERY BYPASS GRAFTING (CABG) using LIMA to LAD (m); RIMA to RAMUS; Endoscopic Right Greater Saphenous Vein: SVG to Diag1; SVG to PLB (right);  and SVG to PL (left).;  Surgeon: [name], MD;  Location: [place];  Service: Open Heart Surgery;  Laterality: N/A;  BILATERAL IMA   ENDARTERECTOMY FEMORAL Right 07/21/2019   Procedure: RIGHT ENDARTERECTOMY FEMORAL WITH PATCH ANGIOPLASTY;  Surgeon: [name], MD;  Location: [place];  Service: Vascular;  Laterality: Right;   ENDARTERECTOMY FEMORAL Right 08/16/2020   Procedure: RIGHT FEMORAL ENDARTERECTOMY;  Surgeon: [name], MD;  Location: [place];  Service: Vascular;  Laterality: Right;   ENDOVEIN HARVEST OF GREATER SAPHENOUS VEIN Right 03/24/2020    Procedure: ENDOVEIN HARVEST OF GREATER SAPHENOUS VEIN;  Surgeon: [name], MD;  Location: [place];  Service: Open Heart Surgery;  Laterality: Right;   EYE SURGERY     cataract removal bilaterally   FEMORAL-POPLITEAL BYPASS GRAFT Right 08/16/2020   Procedure: BYPASS GRAFT FEMORAL-POPLITEAL ARTERY;  Surgeon: [name], MD;  Location: [place];  Service: Vascular;  Laterality: Right;   INTRAVASCULAR LITHOTRIPSY  11/25/2019   Procedure: INTRAVASCULAR LITHOTRIPSY;  Surgeon: [name], MD;  Location: [place] [facility];  Service: Cardiovascular;;  LT. SFA   LEFT HEART CATH AND CORONARY ANGIOGRAPHY N/A 03/22/2020   Procedure: LEFT HEART CATH AND CORONARY ANGIOGRAPHY;  Surgeon: Martinique, Peter M, MD;  Location: [place] [facility];  Service: Cardiovascular;  Laterality: N/A;   LOWER EXTREMITY ANGIOGRAPHY Bilateral 06/15/2019   Procedure: Lower Extremity Angiography;  Surgeon: [name], MD;  Location: [place] [facility];  Service: Cardiovascular;  Laterality: Bilateral;   LOWER EXTREMITY ANGIOGRAPHY Right 08/03/2019   Procedure: LOWER EXTREMITY ANGIOGRAPHY;  Surgeon: [name], MD;  Location: [place] [facility];  Service: Cardiovascular;  Laterality: Right;   PATCH ANGIOPLASTY Right 07/21/2019   Procedure: Patch Angioplasty Right Femoral Artery;  Surgeon: [name], MD;  Location: [place];  Service: Vascular;  Laterality: Right;   PERIPHERAL VASCULAR ATHERECTOMY  08/03/2019   Procedure: PERIPHERAL VASCULAR ATHERECTOMY;  Surgeon: [name], MD;  Location: [place] [facility];  Service: Cardiovascular;;  right SFA, right TP trunk   PERIPHERAL VASCULAR ATHERECTOMY  11/25/2019   Procedure: PERIPHERAL VASCULAR ATHERECTOMY;  Surgeon: [name], MD;  Location: [place] [facility];  Service: Cardiovascular;;  Lt.  POPLITEAL and AT   PERIPHERAL VASCULAR BALLOON ANGIOPLASTY Left 06/15/2019   Procedure: PERIPHERAL VASCULAR BALLOON ANGIOPLASTY;  Surgeon: [name], MD;  Location: [place] [facility];  Service: Cardiovascular;  Laterality: Left;  SFA UNSUCCESSFUL UNABLE TO CROSS LESION   PERIPHERAL VASCULAR BALLOON ANGIOPLASTY  08/03/2019   Procedure: PERIPHERAL VASCULAR BALLOON ANGIOPLASTY;  Surgeon: [name], MD;  Location: [place] [facility];  Service: Cardiovascular;;  right SFA, Right TP trunk   TEE WITHOUT CARDIOVERSION N/A 03/24/2020   Procedure: TRANSESOPHAGEAL ECHOCARDIOGRAM (TEE);  Surgeon: [name], MD;  Location: [place];  Service: Open Heart Surgery;  Laterality: N/A;   TONSILLECTOMY     and adenoidectomy   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/2019   Procedure: TRANSMETATARSAL AMPUTATION;  Surgeon: Trula Slade, DPM;  Location: [place];  Service: Podiatry;  Laterality: Right;   TUBAL LIGATION      Allergies  Allergen Reactions   Bactrim [Sulfamethoxazole-Trimethoprim] Other (See Comments)    ^ K+( elevated)    Demerol [Meperidine]     Delusional    Scopolamine     Delusional    Tramadol Other (See Comments)    Keeps awake   Versed [Midazolam] Anxiety    Frantic,  out of my mind, agitated     Prior to Admission medications   Medication Sig Start Date End Date Taking? Authorizing Provider  acetaminophen (TYLENOL) 500 MG tablet Take 1,000 mg by mouth every 6 (six) hours as needed for moderate pain or headache.   Yes [provider]  Ascorbic Acid (VITAMIN C) 100 MG tablet Take 100 mg by mouth daily with supper.    Yes [provider]  aspirin EC 81 MG tablet Take 81 mg by mouth at bedtime.    Yes [provider]  atorvastatin (LIPITOR) 80 MG tablet Take 80 mg by mouth daily.   Yes [provider]  brimonidine (ALPHAGAN) 0.2 % ophthalmic solution Place 1 drop into the right eye 2 (two) times daily. 01/19/21  Yes [provider]  canagliflozin (INVOKANA) 300 MG TABS tablet Take 300 mg by mouth daily before breakfast.   Yes [provider]  CINNAMON PO Take  2 tablets by mouth in the morning, at noon, and at bedtime.    Yes [provider]  clopidogrel (PLAVIX) 75 MG tablet Take 1 tablet by mouth once daily Patient taking differently: Take 75 mg by mouth daily. 07/19/20  Yes [name], MD  glimepiride (AMARYL) 2 MG tablet Take 1 tablet (2 mg total) by mouth daily before breakfast. 01/23/21  Yes Elayne Snare, MD  hydrochlorothiazide (HYDRODIURIL) 12.5 MG tablet Take 1 tablet by mouth once daily Patient taking differently: Take 12.5 mg by mouth daily. 02/17/21  Yes Elayne Snare, MD  metFORMIN (GLUCOPHAGE) 1000 MG tablet Take 1 tablet by mouth twice daily Patient taking differently: Take 1,000 mg by mouth 2 (two) times daily with a meal. 02/17/21  Yes Isaiah Serge, NP  metoprolol tartrate (LOPRESSOR) 25 MG tablet Take 1.5 tablets (37.5 mg total) by mouth 2 (two) times daily. 04/06/20  Yes Lendon Colonel, NP  pantoprazole (PROTONIX) 40 MG tablet Take 1 tablet (40 mg total) by mouth daily. Patient taking differently: Take 40 mg by mouth See admin instructions. Takes 40 mg 3 times a week M- W - F 10/19/20  Yes [name], MD  Polyethyl Glycol-Propyl Glycol (SYSTANE OP) Place 1 drop into the left eye 2 (two) times daily as needed (Dry eyes).   Yes [provider]  Probiotic Product ([employer] SENIOR PROBIOTIC PO) Take 1 capsule by mouth daily with breakfast.   Yes [provider]  pyridoxine (B-6) 100 MG tablet Take 100 mg by mouth every evening.    Yes [provider]  telmisartan (MICARDIS) 80 MG tablet Take 1 tablet by mouth once daily Patient taking differently: Take 80 mg by mouth daily. 04/19/21  Yes [name], MD  vitamin B-12 (CYANOCOBALAMIN) 500 MCG tablet Take 500 mcg by mouth daily.   Yes [provider]  glucose blood (ONETOUCH VERIO) test strip Use to check blood sugars twice daily 04/24/21   Elayne Snare, MD  Lancets (ONETOUCH DELICA PLUS [id number]) MISC Use to check blood sugars twice  daily. 04/24/21   Elayne Snare, MD    Social History   Socioeconomic History   Marital status: Widowed    Spouse name: Not on file   Number of children: Not on file   Years of education: Not on file   Highest education level: Not on file  Occupational History   Not on file  Tobacco Use   Smoking status: Former    Types: Cigarettes    Quit date: 03/31/1972  Years since quitting: 49.1   Smokeless tobacco: Never  Vaping Use   Vaping Use: Never used  Substance and Sexual Activity   Alcohol use: No   Drug use: No   Sexual activity: Not on file  Other Topics Concern   Not on file  Social History Narrative   Not on file   Social Determinants of Health   Financial Resource Strain: Not on file  Food Insecurity: Not on file  Transportation Needs: Not on file  Physical Activity: Not on file  Stress: Not on file  Social Connections: Not on file  Intimate Partner Violence: Not on file    Family History  Problem Relation Age of Onset   Cancer - Prostate Father    Cancer - Colon Father    Stroke Mother    Hypertension Mother    Hyperlipidemia Mother    Melanoma Brother     ROS:  Cardiovascular: []  chest pain/pressure []  palpitations []  SOB lying flat []  DOE []  pain in legs while walking []  pain in legs at rest []  pain in legs at night [] x non-healing ulcers []  hx of DVT []  swelling in legs  Pulmonary: []  productive cough []  asthma/wheezing []  home O2  Neurologic: []  weakness in []  arms []  legs []  numbness in []  arms []  legs []  hx of CVA []  mini stroke [] difficulty speaking or slurred speech []  temporary loss of vision in one eye []  dizziness  Hematologic: []  hx of cancer []  bleeding problems []  problems with blood clotting easily  Endocrine:   []  diabetes []  thyroid disease  GI []  vomiting blood []  blood in stool  GU: []  CKD/renal failure []  HD--[]  M/W/F or []  T/T/S []  burning with urination []  blood in urine  Psychiatric: []  anxiety []   depression  Musculoskeletal: []  arthritis []  joint pain  Integumentary: []  rashes []  ulcers  Constitutional: []  fever []  chills   Physical Examination  Vitals:   04/27/21 1730 04/27/21 1815  BP: (!) 141/42 (!) 158/64  Pulse: 68 98  Resp: (!) 24 17  Temp:  99.8 F (37.7 C)  SpO2: 100% 100%   Body mass index is 25.52 kg/m.  General:  nad HENT: WNL, normocephalic Pulmonary: normal non-labored breathing Cardiac: Palpable bilateral common femoral pulses, palpable left popliteal pulse Abdomen: soft, NT/ND, no masses Extremities: Well-healed right TMA, gangrenous changes that are dry left third toe Neurologic: A&O X 3; Appropriate Affect ; SENSATION: normal; MOTOR FUNCTION:  moving all extremities equally. Speech is fluent/normal   CBC    Component Value Date/Time   WBC 20.8 (H) 04/27/2021 1228   RBC 3.62 (L) 04/27/2021 1228   HGB 10.5 (L) 04/27/2021 1228   HGB 12.2 07/29/2020 0942   HCT 32.5 (L) 04/27/2021 1228   HCT 37.4 07/29/2020 0942   PLT 238 04/27/2021 1228   PLT 304 07/29/2020 0942   MCV 89.8 04/27/2021 1228   MCV 84 07/29/2020 0942   MCH 29.0 04/27/2021 1228   MCHC 32.3 04/27/2021 1228   RDW 13.6 04/27/2021 1228   RDW 14.8 07/29/2020 0942   LYMPHSABS 1.2 04/27/2021 1228   MONOABS 1.9 (H) 04/27/2021 1228   EOSABS 0.0 04/27/2021 1228   BASOSABS 0.1 04/27/2021 1228    BMET    Component Value Date/Time   NA 132 (L) 04/27/2021 1228   NA 142 07/29/2020 0942   K 3.8 04/27/2021 1228   CL 97 (L) 04/27/2021 1228   CO2 22 04/27/2021 1228   GLUCOSE 165 (H) 04/27/2021 1228  BUN 37 (H) 04/27/2021 1228   BUN 17 07/29/2020 0942   CREATININE 1.41 (H) 04/27/2021 1228   CREATININE 0.86 12/09/2019 1136   CALCIUM 9.4 04/27/2021 1228   GFRNONAA 38 (L) 04/27/2021 1228   GFRAA 75 07/29/2020 0942    COAGS: Lab Results  Component Value Date   INR 1.1 04/27/2021   INR 1.0 08/10/2020   INR 1.8 (H) 03/24/2020     Non-Invasive Vascular Imaging:   Previous  ABIs right 0.84 monophasic and left 0.59 monophasic with toe pressure on the left 37.   ASSESSMENT/PLAN: This is a 80 y.o. female here with new left third toe gangrene.  I have ordered ABIs.  Suspect that these have worsened although prior was moderate to severe disease and there is a palpable popliteal pulse on the left today.  She will need left lower extremity arterial evaluation prior to her toe amputation I will coordinate with Dr. Gwenlyn Found tomorrow.  Kelsey Durflinger C. Donzetta Matters, MD [facility] Office: [phone] Pager: [phone]

## 2021-04-27 NOTE — Progress Notes (Signed)
Pharmacy Antibiotic Note  [name] is a 80 y.o. female admitted on 04/27/2021 with  osteomyelitis .  Pharmacy has been consulted for vancomycin dosing. Patient stubbed her toe on furniture on 8/1 and was seen by her PCP 8/4 who sent her here as her toe is necrotic. WBC 20.8, Temp 100, and LA 1.8.   Plan: Vancomycin 1250 x1 then vancomycin variable d/t current poor renal function (AKI) Zosyn 3.375 x1 Monitor renal function, clinical status, and abx plan.  Weight: 68.5 kg (151 lb 0.2 oz)  Temp (24hrs), Avg:100 F (37.8 C), Min:100 F (37.8 C), Max:100 F (37.8 C)  Recent Labs  Lab 04/27/21 1228  WBC 20.8*  CREATININE 1.41*  LATICACIDVEN 1.8    Estimated Creatinine Clearance: 31.1 mL/min (A) (by C-G formula based on SCr of 1.41 mg/dL (H)).    Allergies  Allergen Reactions   Bactrim [Sulfamethoxazole-Trimethoprim] Other (See Comments)    ^ K+( elevated)    Demerol [Meperidine]     Delusional    Scopolamine     Delusional    Tramadol Other (See Comments)    Keeps awake   Versed [Midazolam] Anxiety    Frantic, out of my mind, agitated     Antimicrobials this admission: Zosyn 8/4 x1 Vancomycin 8/4 >>  Dose adjustments this admission: Renal function is borderline at 31.1 mL/min, Scr 1.41.  Microbiology results: 8/4 BCx: in process   Thank you for allowing pharmacy to be a part of this patient's care.  Varney Daily, PharmD PGY1 Pharmacy Resident  Please check AMION for all [facility] pharmacy phone numbers After 10:00 PM call main pharmacy [phone]

## 2021-04-27 NOTE — ED Notes (Signed)
EKG given to ED provider.

## 2021-04-27 NOTE — ED Triage Notes (Signed)
Patient sent to ED by PCP for evaluation of wound on left foot, history of diabetes. Wound occurred on Monday this week when she scratched her foot on a piece of furniture.

## 2021-04-27 NOTE — ED Provider Notes (Signed)
ED ECG REPORT   Date: 04/27/2021  Rate: 103  Rhythm: sinus tachycardia  QRS Axis: left  Intervals: normal  ST/T Wave abnormalities: nonspecific ST/T changes  Conduction Disutrbances:none  Narrative Interpretation:   Old EKG Reviewed: none available BIGEMINY  I have personally reviewed the EKG tracing and agree with the computerized printout as noted.    Varney Biles, MD 04/27/21 [phone]

## 2021-04-27 NOTE — ED Provider Notes (Addendum)
Emergency Medicine Provider Triage Evaluation Note  [name] , a 80 y.o. female  was evaluated in triage.  Pt complains of left third toe pain, hit her toe on a piece of furniture on Monday, developed worsening swelling, pain, discoloration of the toe.  She also developed redness surrounding her left foot, she is a diabetic, denies systemic infection fevers or chills.  Saw her PCP today and sent here for further evaluation.  Review of Systems  Positive: Left third toe pain, discoloration Negative: Fevers, chills  Physical Exam  BP (!) 124/59   Pulse 92   Temp 100 F (37.8 C) (Oral)   Resp 14   SpO2 96%  Gen:   Awake, no distress   Resp:  Normal effort  MSK:   Moves extremities without difficulty  Other:  Left foot was visualized she has surrounding erythema on the plantar aspect of the foot, the left third toe is necrotic at the distal end warm to the touch, has full range of motion, decrease sensitivity.  Medical Decision Making  Medically screening exam initiated at 12:15 PM.  Appropriate orders placed.  [name] was informed that the remainder of the evaluation will be completed by another provider, this initial triage assessment does not replace that evaluation, and the importance of remaining in the ED until their evaluation is complete.  Presents with r left toe pain patient need further work-up.  Recommend increasing daily level and rooming immediately.   Marcello Fennel, PA-C 04/27/21 1217    Marcello Fennel, PA-C 04/27/21 1217    [place], [name], DO 04/28/21 [phone]

## 2021-04-27 NOTE — ED Provider Notes (Signed)
[facility] EMERGENCY DEPARTMENT Provider Note   CSN: [id number] Arrival date & time: 04/27/21  1140     History Chief Complaint  Patient presents with   Wound Check    [name] is a 80 y.o. female.  80 year old female with a history of PAD (s/p angioplasty with stent to the left SFA and proximal segment 11/2019), T2DM, CAD s/p CABG presenting with a wound on her left third toe.  Patient states that she scraped her toe against a chair this past Monday, 3 days ago.  She did not notice any bleeding with this.  She cleaned her foot with saline and applied Neosporin and an adhesive bandage.  Since then, she has noticed increased pain, swelling and discoloration to the toe.  She went to her PCP earlier today who was concerned about a wound infection so was sent here to the ED for evaluation.  Patient denies pain at the toe but does report some pain at the ball of her foot near the toe.  She has also noticed some redness in her foot extending past her ankle.  Denies fever or chills, numbness.  The history is provided by the patient. No language interpreter was used.  Wound Check This is a new problem. The current episode started more than 2 days ago. The problem occurs constantly. The problem has been gradually worsening. Pertinent negatives include no chest pain. Nothing aggravates the symptoms. Nothing relieves the symptoms.      Past Medical History:  Diagnosis Date   Anemia    after CABG in june 2021   Arthritis    Cancer ([place])    removal from nose - MOSE procedure    Complication of anesthesia    VERSED- agitated, muscle spasms, "jerking" , frantic , (never had this occurence in the pas)    Coronary artery disease    Diabetes mellitus without complication ([place])    Dysrhythmia    PVC's   GERD (gastroesophageal reflux disease)    Heart murmur    History of hiatal hernia    Hyperlipidemia    Hypertension 11/20/11   ECHO- EF>55% Borderline concentric left  ventricular hypertrophy. There is a small calcified mass in the L:A near the LA appendage. No valvular masses seen with associated mitral annular calcification. LA Volume/ BSA27.4 ml/m2 No AS. Right ventricular systolic pressure is elevated at 66mmHg.   Myocardial infarction [facility])    June 2021   Neuromuscular disorder ([place])    neuropathy in bilateral feet   Peripheral vascular disease ([place])    Pneumonia    not hosp.    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   Critical lower limb ischemia ([place]) 08/03/2019   Encounter for immunization 07/09/2016  Essential hypertension 03/31/2013   Dyslipidemia 03/31/2013   DM2 (diabetes mellitus, type 2) ([place]) 03/31/2013   Overweight (BMI 25.0-29.9) 03/31/2013   Carotid artery disease ([place]) 03/31/2013   PAD (peripheral artery disease) ([place]) 03/31/2013    Past Surgical History:  Procedure Laterality Date   ABDOMINAL AORTAGRAM  11/25/2019   ABDOMINAL AORTOGRAM W/LOWER EXTREMITY    ABDOMINAL AORTOGRAM N/A 06/15/2019   Procedure: ABDOMINAL AORTOGRAM;  Surgeon: [name], MD;  Location: [place] [facility];  Service: Cardiovascular;  Laterality: N/A;   ABDOMINAL AORTOGRAM W/LOWER EXTREMITY N/A 11/25/2019    Procedure: ABDOMINAL AORTOGRAM W/LOWER EXTREMITY;  Surgeon: [name], MD;  Location: [place] [facility];  Service: Cardiovascular;  Laterality: N/A;  Lt leg   ABDOMINAL AORTOGRAM W/LOWER EXTREMITY N/A 08/01/2020   Procedure: ABDOMINAL AORTOGRAM W/LOWER EXTREMITY;  Surgeon: [name], MD;  Location: [place] [facility];  Service: Cardiovascular;  Laterality: N/A;   APPENDECTOMY     APPLICATION OF WOUND VAC Right 07/21/2019   Procedure: Application Of Prevena Wound Vac Right Groin;  Surgeon: [name], MD;  Location: [facility];  Service: Vascular;  Laterality: Right;   CARDIAC CATHETERIZATION     CARPAL [place]     x 2   COLONOSCOPY     CORONARY ARTERY BYPASS GRAFT N/A 03/24/2020   Procedure: CORONARY ARTERY BYPASS GRAFTING (CABG) using LIMA to LAD (m); RIMA to RAMUS; Endoscopic Right Greater Saphenous Vein: SVG to Diag1; SVG to PLB (right); and SVG to PL (left).;  Surgeon: [name], MD;  Location: [place];  Service: Open Heart Surgery;  Laterality: N/A;  BILATERAL IMA   ENDARTERECTOMY FEMORAL Right 07/21/2019   Procedure: RIGHT ENDARTERECTOMY FEMORAL WITH PATCH ANGIOPLASTY;  Surgeon: [name], MD;  Location: [place];  Service: Vascular;  Laterality: Right;   ENDARTERECTOMY FEMORAL Right 08/16/2020   Procedure: RIGHT FEMORAL ENDARTERECTOMY;  Surgeon: [name], MD;  Location: [place];  Service: Vascular;  Laterality: Right;   ENDOVEIN HARVEST OF GREATER SAPHENOUS VEIN Right 03/24/2020   Procedure: ENDOVEIN HARVEST OF GREATER SAPHENOUS VEIN;  Surgeon: [name], MD;  Location: [place];  Service: Open Heart Surgery;  Laterality: Right;   EYE SURGERY     cataract removal bilaterally   FEMORAL-POPLITEAL BYPASS GRAFT Right 08/16/2020   Procedure: BYPASS GRAFT FEMORAL-POPLITEAL ARTERY;  Surgeon: [name], MD;  Location: [place];  Service: Vascular;  Laterality: Right;   INTRAVASCULAR  LITHOTRIPSY  11/25/2019   Procedure: INTRAVASCULAR LITHOTRIPSY;  Surgeon: [name], MD;  Location: [place] [facility];  Service: Cardiovascular;;  LT. SFA   LEFT HEART CATH AND CORONARY ANGIOGRAPHY N/A 03/22/2020   Procedure: LEFT HEART CATH AND CORONARY ANGIOGRAPHY;  Surgeon: Martin[place], Peter M, MD;  Location: [place] [facility];  Service: Cardiovascular;  Laterality: N/A;   LOWER EXTREMITY ANGIOGRAPHY Bilateral 06/15/2019   Procedure: Lower Extremity Angiography;  Surgeon: [name], MD;  Location: [place] [facility];  Service: Cardiovascular;  Laterality: Bilateral;   LOWER EXTREMITY ANGIOGRAPHY Right 08/03/2019   Procedure: LOWER EXTREMITY ANGIOGRAPHY;  Surgeon: [name], MD;  Location: [place] [facility];  Service: Cardiovascular;  Laterality: Right;   PATCH ANGIOPLASTY Right 07/21/2019   Procedure: Patch Angioplasty Right Femoral Artery;  Surgeon: [name], MD;  Location: [facility];  Service: Vascular;  Laterality: Right;   PERIPHERAL VASCULAR ATHERECTOMY  08/03/2019  Procedure: PERIPHERAL VASCULAR ATHERECTOMY;  Surgeon: [name], MD;  Location: [place] [facility];  Service: Cardiovascular;;  right SFA, right TP trunk   PERIPHERAL VASCULAR ATHERECTOMY  11/25/2019   Procedure: PERIPHERAL VASCULAR ATHERECTOMY;  Surgeon: [name], MD;  Location: [place] [facility];  Service: Cardiovascular;;  Lt.  POPLITEAL and AT   PERIPHERAL VASCULAR BALLOON ANGIOPLASTY Left 06/15/2019   Procedure: PERIPHERAL VASCULAR BALLOON ANGIOPLASTY;  Surgeon: [name], MD;  Location: [place] [facility];  Service: Cardiovascular;  Laterality: Left;  SFA UNSUCCESSFUL UNABLE TO CROSS LESION   PERIPHERAL VASCULAR BALLOON ANGIOPLASTY  08/03/2019   Procedure: PERIPHERAL VASCULAR BALLOON ANGIOPLASTY;  Surgeon: [name], MD;  Location: [place] [facility];  Service: Cardiovascular;;  right SFA, Right TP trunk   TEE WITHOUT CARDIOVERSION N/A 03/24/2020   Procedure:  TRANSESOPHAGEAL ECHOCARDIOGRAM (TEE);  Surgeon: [name], MD;  Location: [place];  Service: Open Heart Surgery;  Laterality: N/A;   TONSILLECTOMY     and adenoidectomy   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/2019   Procedure: TRANSMETATARSAL AMPUTATION;  Surgeon: Trula Slade, DPM;  Location: [place];  Service: Podiatry;  Laterality: Right;   TUBAL LIGATION       OB History   No obstetric history on file.     Family History  Problem Relation Age of Onset   Cancer - Prostate Father    Cancer - Colon Father    Stroke Mother    Hypertension Mother    Hyperlipidemia Mother    Melanoma Brother     Social History   Tobacco Use   Smoking status: Former    Types: Cigarettes    Quit date: 03/31/1972    Years since quitting: 49.1   Smokeless tobacco: Never  Vaping Use   Vaping Use: Never used  Substance Use Topics   Alcohol use: No   Drug use: No    Home Medications Prior to Admission medications   Medication Sig Start Date End Date Taking? Authorizing Provider  acetaminophen (TYLENOL) 500 MG tablet Take 1,000 mg by mouth every 6 (six) hours as needed for moderate pain or headache.    [provider]  Ascorbic Acid (VITAMIN C) 100 MG tablet Take 100 mg by mouth daily with supper.     [provider]  aspirin EC 81 MG tablet Take 81 mg by mouth at bedtime.     [provider]  atorvastatin (LIPITOR) 80 MG tablet Take 80 mg by mouth daily. Take 1 Tablet Daily    [provider]  Bacillus Coagulans-Inulin (PROBIOTIC) 1-250 BILLION-MG CAPS See admin instructions.    [provider]  brimonidine (ALPHAGAN) 0.2 % ophthalmic solution Place 1 drop into the right eye 2 (two) times daily. 01/19/21   [provider]  calcium carbonate (TUMS - DOSED IN MG ELEMENTAL CALCIUM) 500 MG chewable tablet Chew 2 tablets by mouth daily as needed for indigestion or heartburn.     [provider]  canagliflozin (INVOKANA) 300 MG  TABS tablet Take 300 mg by mouth daily before breakfast.    [provider]  CINNAMON PO Take 2 tablets by mouth in the morning, at noon, and at bedtime.     [provider]  clopidogrel (PLAVIX) 75 MG tablet Take 1 tablet by mouth once daily Patient taking differently: Take 75 mg by mouth daily. 07/19/20   [name], MD  glimepiride (AMARYL) 2 MG tablet Take 1 tablet (2 mg total)  by mouth daily before breakfast. 01/23/21   Elayne Snare, MD  glucose blood (ONETOUCH VERIO) test strip Use to check blood sugars twice daily 04/24/21   Elayne Snare, MD  hydrochlorothiazide (HYDRODIURIL) 12.5 MG tablet Take 1 tablet by mouth once daily 02/17/21   Elayne Snare, MD  Lancets (ONETOUCH DELICA PLUS [id number]) MISC Use to check blood sugars twice daily. 04/24/21   Elayne Snare, MD  metFORMIN (GLUCOPHAGE) 1000 MG tablet Take 1 tablet by mouth twice daily 02/17/21   Isaiah Serge, NP  metoprolol tartrate (LOPRESSOR) 25 MG tablet Take 1.5 tablets (37.5 mg total) by mouth 2 (two) times daily. 04/06/20   Lendon Colonel, NP  pantoprazole (PROTONIX) 40 MG tablet Take 1 tablet (40 mg total) by mouth daily. 10/19/20   [name], MD  Polyethyl Glycol-Propyl Glycol (SYSTANE OP) Place 1 drop into both eyes 2 (two) times daily as needed (Dry eyes).    [provider]  Probiotic Product ([employer] SENIOR PROBIOTIC PO) Take 1 capsule by mouth daily with breakfast.    [provider]  pyridoxine (B-6) 100 MG tablet Take 100 mg by mouth every evening.     [provider]  telmisartan (MICARDIS) 80 MG tablet Take 1 tablet by mouth once daily 04/19/21   [name], MD  vitamin B-12 (CYANOCOBALAMIN) 500 MCG tablet Take 500 mcg by mouth daily.    [provider]    Allergies    Bactrim [sulfamethoxazole-trimethoprim], Demerol [meperidine], Scopolamine, and Versed [midazolam]  Review of Systems   Review of Systems  Constitutional:  Negative for chills and fever.   Cardiovascular:  Negative for chest pain.  Skin:  Positive for wound.  Neurological:  Negative for numbness.   Physical Exam Updated Vital Signs BP (!) 162/57   Pulse (!) 47   Temp 100 F (37.8 C) (Oral)   Resp (!) 21   SpO2 100%   Physical Exam Vitals and nursing note reviewed.  Constitutional:      General: She is not in acute distress.    Appearance: Normal appearance. She is well-developed and normal weight.  HENT:     Head: Normocephalic and atraumatic.  Eyes:     Extraocular Movements: Extraocular movements intact.     Conjunctiva/sclera: Conjunctivae normal.  Cardiovascular:     Rate and Rhythm: Normal rate and regular rhythm.     Heart sounds: No murmur heard.    Comments: Unable to palpate left DP pulse.  1+ PT pulse on the left. Pulmonary:     Effort: Pulmonary effort is normal. No respiratory distress.     Breath sounds: Normal breath sounds.  Abdominal:     Palpations: Abdomen is soft.     Tenderness: There is no abdominal tenderness.  Musculoskeletal:     Cervical back: Neck supple.     Comments: Mild tenderness to palpation at the ball of the left foot proximal to third digit.  Skin:    General: Skin is warm and dry.     Comments: Left third toe necrotic with discoloration and scant amount of bleeding at the distal ankle.  Foot is erythematous extending just past the ankle.  Left foot is warm to the touch.  Neurological:     Mental Status: She is alert.      ED Results / Procedures / Treatments   Labs (all labs ordered are listed, but only abnormal results are displayed) Labs Reviewed  BASIC METABOLIC PANEL - Abnormal; Notable for the following components:  Result Value   Sodium 132 (*)    Chloride 97 (*)    Glucose, Bld 165 (*)    BUN 37 (*)    Creatinine, Ser 1.41 (*)    GFR, Estimated 38 (*)    All other components within normal limits  CBC WITH DIFFERENTIAL/PLATELET - Abnormal; Notable for the following components:   WBC 20.8 (*)     RBC 3.62 (*)    Hemoglobin 10.5 (*)    HCT 32.5 (*)    Neutro Abs 17.4 (*)    Monocytes Absolute 1.9 (*)    Abs Immature Granulocytes 0.19 (*)    All other components within normal limits  CULTURE, BLOOD (ROUTINE X 2)  CULTURE, BLOOD (ROUTINE X 2)  RESP PANEL BY RT-PCR (FLU A&B, COVID) ARPGX2  LACTIC ACID, PLASMA  PROTIME-INR  LACTIC ACID, PLASMA    EKG None  Radiology DG Foot Complete Left  Result Date: 04/27/2021 CLINICAL DATA:  Wound along the left third toe. History of diabetes. 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 Irregularity of the adjacent soft tissues noted. There is gas tracking in the soft tissues of the proximal toe around the proximal metaphysis of the proximal phalanx of the third toe, indicating soft tissue infection. There is shortening of the proximal phalanx of the fourth toe with absence of the prior head of the proximal phalanx related to the fractures shown on 09/04/2019. Old deformity of the fifth metatarsal compatible with old healed fracture. Accessory navicular. Plantar and Achilles calcaneal spurs. Atherosclerotic calcification. Dorsal midfoot spurring. IMPRESSION: 1. 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 Abnormal gas tracking in the soft tissues of the proximal third toe. 2. Old deformities of the proximal phalanx fourth toe and of the fifth metatarsal resulting from prior fractures. 3. Plantar and Achilles calcaneal spurs. 4. Atherosclerosis. Electronically Signed   By: Van Clines M.D.   On: 04/27/2021 13:17    Procedures Procedures   Medications Ordered in ED Medications - No data to display  ED Course  I have reviewed the triage vital signs and the nursing notes.  Pertinent labs & imaging results that were available during my care of the patient were reviewed by me and considered in my medical  decision making (see chart for details).    MDM Rules/Calculators/A&P                         80 year old female with underlying PAD and T2DM presenting with infected appearing wound on her left third toe, which is necrotic, consistent with osteomyelitis with concomitant cellulitis. Low grade temp 100.0 F here in the ED.  Labs notable for leukocytosis with WBC 20.8, normal lactate 1.8.  XR of the left foot with evidence of osteomyelitis of the distal phalanx of the third toe.  Blood cultures obtained, pending.  Also with elevated serum creatinine 1.41 (baseline appears to be around 1), mild hyponatremia with sodium 132, mild anemia with hemoglobin 10.5 (mild drop from most recent hemoglobin 11.9).  Given findings of osteomyelitis with cellulitis, patient will need admission for IV antibiotics and possible vascular/surgical intervention given her history.  We will go ahead and start IV vancomycin and piperacillin-tazobactam given concomitant cellulitis.  Low concern for acute limb ischemia at this time, foot is warm without significant pain.  Nonseptic appearing and with normal lactate. Will page  out to hospitalist and podiatry.  Dr. March Rummage with [facility] consulted, will see the patient this afternoon.  Discussed with Dr. Tamala Julian with [facility], patient to be admitted.  Final Clinical Impression(s) / ED Diagnoses Final diagnoses:  None    Rx / DC Orders ED Discharge Orders     None        Zola Button, MD 04/27/21 1615    Elnora Morrison, MD 04/28/21 2203

## 2021-04-27 NOTE — H&P (Signed)
History and Physical    [name] FWY:[id number] DOB: 12-Sep-1941 DOA: 04/27/2021  Referring MD/NP/PA: Zola Button, MD PCP: [name], MD  Patient coming from: Sent by PCP  Chief Complaint: Left third toe wound  I have personally briefly reviewed patient's old medical records in [place]   HPI: [name] is a 80 y.o. female with medical history significant of hypertension, hyperlipidemia, CAD s/p CABG in 2021, PVD s/p bypass grafting, diabetes mellitus type II, ray amputation of right foot presents with left third toe wound.  She had initially scraped her third toe on a barstool while she was playing pool 3 days ago.  At that time it did not bleed. She cleaned it with saline and put Iodoflex gauze around the wound.  However, patient noted that she had developed redness that was streaking up her left leg.  Associated symptoms included intermittent pain in affected toe as well as fever and chills yesterday.  Patient reports at home her fever got up to 102 F.  At home she has been taking Tylenol for her symptoms.  She is s/p right common femoral endarterectomy with femoral popliteal bypass graft on 08/16/2020.  And previously had angioplasty with stent continue to have the left SFA.  ED Course: Upon admission into the emergency department patient was seen to have a temperature of 100 F, pulse 93-98, respirations 14-21, blood pressures 124/59-162/57, and O2 saturation maintained on room air.  Labs significant for WBC 20.8, hemoglobin 10.5, sodium 132, BUN 37, creatinine 1.41, and lactic acid 1.8.  X-rays of the left foot noted osteomyelitis of the distal phalanx third toe with abnormal gas tracking in the soft tissues of the proximal third toe.  Blood cultures have been obtained and patient has been started on empiric antibiotics of vancomycin and Zosyn.  TRH called to admit.   Review of Systems  Constitutional:  Positive for chills and fever.  Eyes:  Negative for pain.   Respiratory:  Negative for shortness of breath.   Cardiovascular:  Negative for chest pain and leg swelling.  Gastrointestinal:  Negative for abdominal pain, nausea and vomiting.  Genitourinary:  Negative for dysuria.  Musculoskeletal:  Negative for falls.  Skin:        Positive for skin color change and wound of left toe  Neurological:  Negative for focal weakness and loss of consciousness.  Endo/Heme/Allergies:  Bruises/bleeds easily.  Psychiatric/Behavioral:  Negative for substance abuse.    Past Medical History:  Diagnosis Date   Anemia    after CABG in june 2021   Arthritis    Cancer ([place])    removal from nose - MOSE procedure    Complication of anesthesia    VERSED- agitated, muscle spasms, "jerking" , frantic , (never had this occurence in the pas)    Coronary artery disease    Diabetes mellitus without complication ([place])    Dysrhythmia    PVC's   GERD (gastroesophageal reflux disease)    Heart murmur    History of hiatal hernia    Hyperlipidemia    Hypertension 11/20/11   ECHO- EF>55% Borderline concentric left ventricular hypertrophy. There is a small calcified mass in the L:A near the LA appendage. No valvular masses seen with associated mitral annular calcification. LA Volume/ BSA27.4 ml/m2 No AS. Right ventricular systolic pressure is elevated at 34mHg.   Myocardial infarction ([facility]    June 2021   Neuromuscular disorder (HCC)    neuropathy in bilateral feet  Peripheral vascular disease ([place])    Pneumonia    not hosp.    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  PTCAs with drug coated balloon Lt ant tibial artery and Lt popliteal artery  11/26/2019    Past Surgical History:  Procedure Laterality Date   ABDOMINAL AORTAGRAM  11/25/2019   ABDOMINAL AORTOGRAM W/LOWER EXTREMITY    ABDOMINAL AORTOGRAM N/A 06/15/2019   Procedure: ABDOMINAL AORTOGRAM;  Surgeon: [name], MD;  Location: [place] [facility];  Service: Cardiovascular;  Laterality: N/A;    ABDOMINAL AORTOGRAM W/LOWER EXTREMITY N/A 11/25/2019   Procedure: ABDOMINAL AORTOGRAM W/LOWER EXTREMITY;  Surgeon: [name], MD;  Location: [place] [facility];  Service: Cardiovascular;  Laterality: N/A;  Lt leg   ABDOMINAL AORTOGRAM W/LOWER EXTREMITY N/A 08/01/2020   Procedure: ABDOMINAL AORTOGRAM W/LOWER EXTREMITY;  Surgeon: [name], MD;  Location: [place] [facility];  Service: Cardiovascular;  Laterality: N/A;   APPENDECTOMY     APPLICATION OF WOUND VAC Right 07/21/2019   Procedure: Application Of Prevena Wound Vac Right Groin;  Surgeon: [name], MD;  Location: [facility];  Service: Vascular;  Laterality: Right;   CARDIAC CATHETERIZATION     CARPAL [place]     x 2   COLONOSCOPY     CORONARY ARTERY BYPASS GRAFT N/A 03/24/2020   Procedure: CORONARY ARTERY BYPASS GRAFTING (CABG) using LIMA to LAD (m); RIMA to RAMUS; Endoscopic Right Greater Saphenous Vein: SVG to Diag1; SVG to PLB (right); and SVG to PL (left).;  Surgeon: [name], MD;  Location: [place];  Service: Open Heart Surgery;  Laterality: N/A;  BILATERAL IMA   ENDARTERECTOMY FEMORAL Right 07/21/2019   Procedure: RIGHT ENDARTERECTOMY FEMORAL WITH PATCH ANGIOPLASTY;  Surgeon: [name], MD;  Location: [place];  Service: Vascular;  Laterality: Right;   ENDARTERECTOMY FEMORAL Right 08/16/2020   Procedure: RIGHT FEMORAL ENDARTERECTOMY;  Surgeon: [name], MD;  Location: [place];  Service: Vascular;  Laterality: Right;   ENDOVEIN HARVEST OF GREATER SAPHENOUS VEIN Right 03/24/2020   Procedure: ENDOVEIN HARVEST OF GREATER SAPHENOUS VEIN;  Surgeon: [name], MD;  Location: [place];  Service: Open Heart Surgery;  Laterality: Right;   EYE SURGERY     cataract removal bilaterally   FEMORAL-POPLITEAL BYPASS GRAFT Right 08/16/2020   Procedure: BYPASS GRAFT FEMORAL-POPLITEAL ARTERY;  Surgeon: [name], MD;  Location: [place];   Service: Vascular;  Laterality: Right;   INTRAVASCULAR LITHOTRIPSY  11/25/2019   Procedure: INTRAVASCULAR LITHOTRIPSY;  Surgeon: [name], MD;  Location: [place] [facility];  Service: Cardiovascular;;  LT. SFA   LEFT HEART CATH AND CORONARY ANGIOGRAPHY N/A 03/22/2020   Procedure: LEFT HEART CATH AND CORONARY ANGIOGRAPHY;  Surgeon: Martinique, Peter M, MD;  Location: [place] [facility];  Service: Cardiovascular;  Laterality: N/A;   LOWER EXTREMITY ANGIOGRAPHY Bilateral 06/15/2019   Procedure: Lower Extremity Angiography;  Surgeon: [name], MD;  Location: [place] [facility];  Service: Cardiovascular;  Laterality: Bilateral;   LOWER EXTREMITY ANGIOGRAPHY Right 08/03/2019   Procedure: LOWER EXTREMITY ANGIOGRAPHY;  Surgeon: [name], MD;  Location: [place] [facility];  Service: Cardiovascular;  Laterality: Right;   PATCH ANGIOPLASTY Right 07/21/2019   Procedure: Patch Angioplasty Right Femoral Artery;  Surgeon: [name], MD;  Location: [facility] OR;  Service: Vascular;  Laterality: Right;   PERIPHERAL VASCULAR ATHERECTOMY  08/03/2019   Procedure: PERIPHERAL VASCULAR ATHERECTOMY;  Surgeon: [name], MD;  Location: [place] [facility];  Service: Cardiovascular;;  right SFA, right TP trunk   PERIPHERAL VASCULAR ATHERECTOMY  11/25/2019   Procedure: PERIPHERAL VASCULAR ATHERECTOMY;  Surgeon: [name], MD;  Location: [place] [facility];  Service: Cardiovascular;;  Lt.  POPLITEAL and AT   PERIPHERAL VASCULAR BALLOON ANGIOPLASTY Left 06/15/2019   Procedure: PERIPHERAL VASCULAR BALLOON ANGIOPLASTY;  Surgeon: [name], MD;  Location: [place] [facility];  Service: Cardiovascular;  Laterality: Left;  SFA UNSUCCESSFUL UNABLE TO CROSS LESION   PERIPHERAL VASCULAR BALLOON ANGIOPLASTY  08/03/2019   Procedure: PERIPHERAL VASCULAR BALLOON ANGIOPLASTY;  Surgeon: [name], MD;  Location: [place] [facility];  Service: Cardiovascular;;  right SFA, Right TP trunk   TEE  WITHOUT CARDIOVERSION N/A 03/24/2020   Procedure: TRANSESOPHAGEAL ECHOCARDIOGRAM (TEE);  Surgeon: [name], MD;  Location: [place];  Service: Open Heart Surgery;  Laterality: N/A;   TONSILLECTOMY     and adenoidectomy   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/2019   Procedure: TRANSMETATARSAL AMPUTATION;  Surgeon: Trula Slade, DPM;  Location: [place];  Service: Podiatry;  Laterality: Right;   TUBAL LIGATION       reports that she quit smoking about 49 years ago. She has never used smokeless tobacco. She reports that she does not drink alcohol and does not use drugs.  Allergies  Allergen Reactions   Bactrim [Sulfamethoxazole-Trimethoprim] Other (See Comments)    ^ K+( elevated)    Demerol [Meperidine]     Delusional    Scopolamine     Delusional    Tramadol Other (See Comments)    Keeps awake   Versed [Midazolam] Anxiety    Frantic, out of my mind, agitated     Family History  Problem Relation Age of Onset   Cancer - Prostate Father    Cancer - Colon Father    Stroke Mother    Hypertension Mother    Hyperlipidemia Mother    Melanoma Brother     Prior to Admission medications   Medication Sig Start Date End Date Taking? Authorizing Provider  acetaminophen (TYLENOL) 500 MG tablet Take 1,000 mg by mouth every 6 (six) hours as needed for moderate pain or headache.   Yes [provider]  Ascorbic Acid (VITAMIN C) 100 MG tablet Take 100 mg by mouth daily with supper.    Yes [provider]  aspirin EC 81 MG tablet Take 81 mg by mouth at bedtime.    Yes [provider]  atorvastatin (LIPITOR) 80 MG tablet Take 80 mg by mouth daily.   Yes [provider]  brimonidine (ALPHAGAN) 0.2 % ophthalmic solution Place 1 drop into the right eye 2 (two) times daily. 01/19/21  Yes [provider]  canagliflozin (INVOKANA) 300 MG TABS tablet Take 300 mg by mouth daily before breakfast.   Yes [provider]  CINNAMON PO Take 2 tablets  by mouth in the morning, at noon, and at bedtime.    Yes [provider]  clopidogrel (PLAVIX) 75 MG tablet Take 1 tablet by mouth once daily Patient taking differently: Take 75 mg by mouth daily. 07/19/20  Yes [name], MD  glimepiride (AMARYL) 2 MG tablet Take 1 tablet (2 mg total) by mouth daily before breakfast. 01/23/21  Yes Elayne Snare, MD  hydrochlorothiazide (HYDRODIURIL) 12.5 MG tablet Take 1 tablet by mouth once daily Patient taking differently: Take 12.5 mg  by mouth daily. 02/17/21  Yes Elayne Snare, MD  metFORMIN (GLUCOPHAGE) 1000 MG tablet Take 1 tablet by mouth twice daily Patient taking differently: Take 1,000 mg by mouth 2 (two) times daily with a meal. 02/17/21  Yes Isaiah Serge, NP  metoprolol tartrate (LOPRESSOR) 25 MG tablet Take 1.5 tablets (37.5 mg total) by mouth 2 (two) times daily. 04/06/20  Yes Lendon Colonel, NP  pantoprazole (PROTONIX) 40 MG tablet Take 1 tablet (40 mg total) by mouth daily. Patient taking differently: Take 40 mg by mouth See admin instructions. Takes 40 mg 3 times a week M- W - F 10/19/20  Yes [name], MD  Polyethyl Glycol-Propyl Glycol (SYSTANE OP) Place 1 drop into the left eye 2 (two) times daily as needed (Dry eyes).   Yes [provider]  Probiotic Product ([employer] SENIOR PROBIOTIC PO) Take 1 capsule by mouth daily with breakfast.   Yes [provider]  pyridoxine (B-6) 100 MG tablet Take 100 mg by mouth every evening.    Yes [provider]  telmisartan (MICARDIS) 80 MG tablet Take 1 tablet by mouth once daily Patient taking differently: Take 80 mg by mouth daily. 04/19/21  Yes [name], MD  vitamin B-12 (CYANOCOBALAMIN) 500 MCG tablet Take 500 mcg by mouth daily.   Yes [provider]  glucose blood (ONETOUCH VERIO) test strip Use to check blood sugars twice daily 04/24/21   Elayne Snare, MD  Lancets (ONETOUCH DELICA PLUS [id number]) MISC Use to check blood sugars twice daily.  04/24/21   Elayne Snare, MD    Physical Exam:  Constitutional: Elderly female currently in no acute distress Vitals:   04/27/21 1151 04/27/21 1323 04/27/21 1445 04/27/21 1500  BP: (!) 124/59 (!) 135/54 (!) 162/57   Pulse: 92 97 (!) 47   Resp: 14 18 (!) 21   Temp: 100 F (37.8 C)     TempSrc: Oral     SpO2: 96% 98% 100%   Weight:    68.5 kg   Eyes: PERRL, lids and conjunctivae normal ENMT: Mucous membranes are moist. Posterior pharynx clear of any exudate or lesions.Normal dentition.  Neck: normal, supple, no masses, no thyromegaly Respiratory: clear to auscultation bilaterally, no wheezing, no crackles. Normal respiratory effort. No accessory muscle use.  Cardiovascular: Regular rate and rhythm, no murmurs / rubs / gallops. No extremity edema. 1+ pedal pulses. No carotid bruits.  Abdomen: no tenderness, no masses palpated. No hepatosplenomegaly. Bowel sounds positive.  Musculoskeletal: no clubbing / cyanosis.  Transmetatarsal amputation of the right foot Skin: Necrotic appearance of the third toe with bleeding present, but this could  Neurologic: CN 2-12 grossly intact. Sensation intact, DTR normal. Strength 5/5 in all 4.  Psychiatric: Normal judgment and insight. Alert and oriented x 3. Normal mood.     Labs on Admission: I have personally reviewed following labs and imaging studies  CBC: Recent Labs  Lab 04/27/21 1228  WBC 20.8*  NEUTROABS 17.4*  HGB 10.5*  HCT 32.5*  MCV 89.8  PLT 706   Basic Metabolic Panel: Recent Labs  Lab 04/27/21 1228  NA 132*  K 3.8  CL 97*  CO2 22  GLUCOSE 165*  BUN 37*  CREATININE 1.41*  CALCIUM 9.4   GFR: Estimated Creatinine Clearance: 31.1 mL/min (A) (by C-G formula based on SCr of 1.41 mg/dL (H)). Liver Function Tests: No results for input(s): AST, ALT, ALKPHOS, BILITOT, PROT, ALBUMIN in the last 168 hours. No results for input(s): LIPASE, AMYLASE  in the last 168 hours. No results for input(s): AMMONIA in the last 168  hours. Coagulation Profile: Recent Labs  Lab 04/27/21 1228  INR 1.1   Cardiac Enzymes: No results for input(s): CKTOTAL, CKMB, CKMBINDEX, TROPONINI in the last 168 hours. BNP (last 3 results) No results for input(s): PROBNP in the last 8760 hours. HbA1C: No results for input(s): HGBA1C in the last 72 hours. CBG: No results for input(s): GLUCAP in the last 168 hours. Lipid Profile: No results for input(s): CHOL, HDL, LDLCALC, TRIG, CHOLHDL, LDLDIRECT in the last 72 hours. Thyroid Function Tests: No results for input(s): TSH, T4TOTAL, FREET4, T3FREE, THYROIDAB in the last 72 hours. Anemia Panel: No results for input(s): VITAMINB12, FOLATE, FERRITIN, TIBC, IRON, RETICCTPCT in the last 72 hours. Urine analysis:    Component Value Date/Time   COLORURINE YELLOW 08/10/2020 0952   APPEARANCEUR CLEAR 08/10/2020 0952   LABSPEC 1.018 08/10/2020 0952   PHURINE 5.0 08/10/2020 0952   GLUCOSEU >=500 (A) 08/10/2020 0952   HGBUR NEGATIVE 08/10/2020 0952   BILIRUBINUR NEGATIVE 08/10/2020 0952   KETONESUR NEGATIVE 08/10/2020 0952   PROTEINUR NEGATIVE 08/10/2020 0952   NITRITE NEGATIVE 08/10/2020 0952   LEUKOCYTESUR SMALL (A) 08/10/2020 0952   Sepsis Labs: No results found for this or any previous visit (from the past 240 hour(s)).   Radiological Exams on Admission: DG Foot Complete Left  Result Date: 04/27/2021 CLINICAL DATA:  Wound along the left third toe. History of diabetes. 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 Irregularity of the adjacent soft tissues noted. There is gas tracking in the soft tissues of the proximal toe around the proximal metaphysis of the proximal phalanx of the third toe, indicating soft tissue infection. There is shortening of the proximal phalanx of the fourth toe with absence of the prior head of the proximal phalanx related  to the fractures shown on 09/04/2019. Old deformity of the fifth metatarsal compatible with old healed fracture. Accessory navicular. Plantar and Achilles calcaneal spurs. Atherosclerotic calcification. Dorsal midfoot spurring. IMPRESSION: 1. 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 Abnormal gas tracking in the soft tissues of the proximal third toe. 2. Old deformities of the proximal phalanx fourth toe and of the fifth metatarsal resulting from prior fractures. 3. Plantar and Achilles calcaneal spurs. 4. Atherosclerosis. Electronically Signed   By: Van Clines M.D.   On: 04/27/2021 13:17    EKG: Independently reviewed.  Sinus tachycardia 103 bpm  with RBBB  Assessment/Plan Cellulitis and osteomyelitis of the left third toe: Acute.  Patient presents with worsening wound of the left third toe after scraping it on a barstool at home 3 days ago.  On physical exam patient with darken and necrotic leg appearance at the distal aspect of the left third toe with erythema streaking up left forefoot to the mid tibia.  X-rays revealed concern for osteomyelitis.  Suspecting osteomyelitis with cellulitis and possible gangrene of the left third toe. -Admit to a medical telemetry bed -Lower extremity wound care order set utilized -Follow-up blood cultures -Check ESR, CRP, prealbumin -ABI with/without TBI -Continue empiric antibiotics of vancomycin and Zosyn -Appreciate podiatry consultative services, we will follow-up for further recommendation -Appreciate vascular surgery consultative services we will follow-up for any further recommendations.  Acute kidney injury superimposed on chronic kidney disease stage IIIa: Patient presents with creatinine elevated up to 1.41 with BUN 37.  Her  baseline creatinine had been around 0.98. -Hold nephrotoxic agents -Normal saline IV fluids at 100 mL/h  Hyponatremia: Acute.  On admission sodium 132.  Suspect secondary to patient's diuretics and dehydration -Hold  diuretics -Normal saline IV fluids as noted above  Essential hypertension: Home blood pressure medications include Micardis 80 mg daily, hydrochlorothiazide 12.5 mg daily, and metoprolol 37.5 mg twice daily. -Continue metoprolol -Resume patient's other blood pressure medications when medically appropriate  Diabetes mellitus type 2: On admission patient presents with glucose of 165.  Last hemoglobin A1c was 7.7 on 01/18/21.  Home medications include Invokana 300 mg daily, Amaryl 2 mg daily, and metformin 1000 mg twice daily. -Hypoglycemic protocols -Hold home oral medication -Check hemoglobin A1c -CBGs before every meal with moderate SSI  Anemia of chronic disease: Hemoglobin 10.5 g/dL which appears improved from patient's baseline. -Continue to monitor  CAD: s/p CABG in 03/2020 -Continue statin, aspirin, Plavix  Peripheral vascular disease: Patient is status post right foot ray amputation back in 07/2019. -Continue statin, Plavix, and aspirin   Hyperlipidemia -Check lipid panel tomorrow -Continue atorvastatin   DVT prophylaxis: Lovenox Code Status: Full Family Communication: Family updated at bedside Disposition Plan: To be determined Consults called: Podiatry Admission status: Inpatient, require more than 2 midnight stay for IV antibiotic  Norval Morton MD [facility]   If 7PM-7AM, please contact night-coverage   04/27/2021, 4:11 PM

## 2021-04-27 NOTE — Consult Note (Signed)
Reason for Consult: OM Left 3rd toe Referring Physician: Dr. Zola Button  [name] is an 80 y.o. female.  HPI: This is a 80 year old female with a hx of right foot TMA, hx of PAD, who presents with new injury to the left 3rd toe. States the toe was injured on Monday, and has progressively worsened since then. Patient was at her PCP today who recommended admission given streaking up her leg.  Past Medical History:  Diagnosis Date   Anemia    after CABG in june 2021   Arthritis    Cancer ([place])    removal from nose - MOSE procedure    Complication of anesthesia    VERSED- agitated, muscle spasms, "jerking" , frantic , (never had this occurence in the pas)    Coronary artery disease    Diabetes mellitus without complication ([place])    Dysrhythmia    PVC's   GERD (gastroesophageal reflux disease)    Heart murmur    History of hiatal hernia    Hyperlipidemia    Hypertension 11/20/11   ECHO- EF>55% Borderline concentric left ventricular hypertrophy. There is a small calcified mass in the L:A near the LA appendage. No valvular masses seen with associated mitral annular calcification. LA Volume/ BSA27.4 ml/m2 No AS. Right ventricular systolic pressure is elevated at 32mHg.   Myocardial infarction ([facility]    June 2021   Neuromuscular disorder ([place]    neuropathy in bilateral feet   Peripheral vascular disease ([place]    Pneumonia    not hosp.    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  PTCAs with drug coated balloon Lt ant tibial artery and Lt popliteal artery  11/26/2019    Past Surgical History:  Procedure Laterality Date   ABDOMINAL AORTAGRAM  11/25/2019   ABDOMINAL AORTOGRAM W/LOWER EXTREMITY    ABDOMINAL AORTOGRAM N/A 06/15/2019   Procedure: ABDOMINAL AORTOGRAM;  Surgeon: BLorretta Harp MD;  [place];  Service: Cardiovascular;  Laterality: N/A;   ABDOMINAL AORTOGRAM W/LOWER EXTREMITY N/A 11/25/2019   Procedure: ABDOMINAL AORTOGRAM W/LOWER EXTREMITY;   Surgeon: AWellington Hampshire MD;  [place] [facility];  Service: Cardiovascular;  Laterality: N/A;  Lt leg   ABDOMINAL AORTOGRAM W/LOWER EXTREMITY N/A 08/01/2020   Procedure: ABDOMINAL AORTOGRAM W/LOWER EXTREMITY;  Surgeon: BLorretta Harp MD;  [place];  Service: Cardiovascular;  Laterality: N/A;   APPENDECTOMY     APPLICATION OF WOUND VAC Right 07/21/2019   Procedure: Application Of Prevena Wound Vac Right Groin;  Surgeon: BSerafina Mitchell MD;  [facility];  Service: Vascular;  Laterality: Right;   CARDIAC CATHETERIZATION     CARPAL [place]    x 2   COLONOSCOPY     CORONARY ARTERY BYPASS GRAFT N/A 03/24/2020   Procedure: CORONARY ARTERY BYPASS GRAFTING (CABG) using LIMA to LAD (m); RIMA to RAMUS; Endoscopic Right Greater Saphenous Vein: SVG to Diag1; SVG to PLB (right); and SVG to PL (left).;  Surgeon: AWonda Olds MD;  [place] [facility]  Service: Open Heart Surgery;  Laterality: N/A;  BILATERAL IMA   ENDARTERECTOMY FEMORAL Right 07/21/2019   Procedure: RIGHT ENDARTERECTOMY FEMORAL WITH PATCH ANGIOPLASTY;  Surgeon: BSerafina Mitchell MD;  [place]  Service: Vascular;  Laterality: Right;   ENDARTERECTOMY FEMORAL Right 08/16/2020   Procedure: RIGHT FEMORAL ENDARTERECTOMY;  Surgeon: CServando Snare  Harrell Gave, MD;  Location: [place];  Service: Vascular;  Laterality: Right;   ENDOVEIN HARVEST OF GREATER SAPHENOUS VEIN Right 03/24/2020   Procedure: ENDOVEIN HARVEST OF GREATER SAPHENOUS VEIN;  Surgeon: [name], MD;  Location: [place];  Service: Open Heart Surgery;  Laterality: Right;   EYE SURGERY     cataract removal bilaterally   FEMORAL-POPLITEAL BYPASS GRAFT Right 08/16/2020   Procedure: BYPASS GRAFT FEMORAL-POPLITEAL ARTERY;  Surgeon: [name], MD;  Location: [place];  Service: Vascular;  Laterality: Right;   INTRAVASCULAR LITHOTRIPSY  11/25/2019   Procedure: INTRAVASCULAR  LITHOTRIPSY;  Surgeon: [name], MD;  Location: [place] [facility];  Service: Cardiovascular;;  LT. SFA   LEFT HEART CATH AND CORONARY ANGIOGRAPHY N/A 03/22/2020   Procedure: LEFT HEART CATH AND CORONARY ANGIOGRAPHY;  Surgeon: Martinique, Peter M, MD;  Location: [place] [facility];  Service: Cardiovascular;  Laterality: N/A;   LOWER EXTREMITY ANGIOGRAPHY Bilateral 06/15/2019   Procedure: Lower Extremity Angiography;  Surgeon: [name], MD;  Location: [place] [facility];  Service: Cardiovascular;  Laterality: Bilateral;   LOWER EXTREMITY ANGIOGRAPHY Right 08/03/2019   Procedure: LOWER EXTREMITY ANGIOGRAPHY;  Surgeon: [name], MD;  Location: [place] [facility];  Service: Cardiovascular;  Laterality: Right;   PATCH ANGIOPLASTY Right 07/21/2019   Procedure: Patch Angioplasty Right Femoral Artery;  Surgeon: [name], MD;  Location: [place];  Service: Vascular;  Laterality: Right;   PERIPHERAL VASCULAR ATHERECTOMY  08/03/2019   Procedure: PERIPHERAL VASCULAR ATHERECTOMY;  Surgeon: [name], MD;  Location: [place] [facility];  Service: Cardiovascular;;  right SFA, right TP trunk   PERIPHERAL VASCULAR ATHERECTOMY  11/25/2019   Procedure: PERIPHERAL VASCULAR ATHERECTOMY;  Surgeon: [name], MD;  Location: [place] [facility];  Service: Cardiovascular;;  Lt.  POPLITEAL and AT   PERIPHERAL VASCULAR BALLOON ANGIOPLASTY Left 06/15/2019   Procedure: PERIPHERAL VASCULAR BALLOON ANGIOPLASTY;  Surgeon: [name], MD;  Location: [place] [facility];  Service: Cardiovascular;  Laterality: Left;  SFA UNSUCCESSFUL UNABLE TO CROSS LESION   PERIPHERAL VASCULAR BALLOON ANGIOPLASTY  08/03/2019   Procedure: PERIPHERAL VASCULAR BALLOON ANGIOPLASTY;  Surgeon: [name], MD;  Location: [place] [facility];  Service: Cardiovascular;;  right SFA, Right TP trunk   TEE WITHOUT CARDIOVERSION N/A 03/24/2020   Procedure: TRANSESOPHAGEAL ECHOCARDIOGRAM (TEE);  Surgeon: [name], MD;  Location: [place];  Service: Open Heart Surgery;  Laterality: N/A;   TONSILLECTOMY     and adenoidectomy   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/2019   Procedure: TRANSMETATARSAL AMPUTATION;  Surgeon: Trula Slade, DPM;  Location: [place];  Service: Podiatry;  Laterality: Right;   TUBAL LIGATION      Family History  Problem Relation Age of Onset   Cancer - Prostate Father    Cancer - Colon Father    Stroke Mother    Hypertension Mother    Hyperlipidemia Mother    Melanoma Brother     Social History:  reports that she quit smoking about 49 years ago. She has never used smokeless tobacco. She reports that she does not drink alcohol and does not use drugs.  Allergies:  Allergies  Allergen Reactions   Bactrim [Sulfamethoxazole-Trimethoprim] Other (See Comments)    ^ K+( elevated)    Demerol [Meperidine]     Delusional    Scopolamine     Delusional    Tramadol Other (See Comments)    Keeps awake   Versed [  Midazolam] Anxiety    Frantic, out of my mind, agitated     Medications: I have reviewed the patient's current medications.  Results for orders placed or performed during the hospital encounter of 04/27/21 (from the past 48 hour(s))  Basic metabolic panel     Status: Abnormal   Collection Time: 04/27/21 12:28 PM  Result Value Ref Range   Sodium 132 (L) 135 - 145 mmol/L   Potassium 3.8 3.5 - 5.1 mmol/L   Chloride 97 (L) 98 - 111 mmol/L   CO2 22 22 - 32 mmol/L   Glucose, Bld 165 (H) 70 - 99 mg/dL    Comment: Glucose reference range applies only to samples taken after fasting for at least 8 hours.   BUN 37 (H) 8 - 23 mg/dL   Creatinine, Ser 1.41 (H) 0.44 - 1.00 mg/dL   Calcium 9.4 8.9 - 10.3 mg/dL   GFR, Estimated 38 (L) >60 mL/min    Comment: (NOTE) Calculated using the CKD-EPI Creatinine Equation (2021)    Anion gap 13 5 - 15    Comment: Performed at [facility] [address] [place], [place] [address]  CBC with Differential     Status: Abnormal    Collection Time: 04/27/21 12:28 PM  Result Value Ref Range   WBC 20.8 (H) 4.0 - 10.5 K/uL   RBC 3.62 (L) 3.87 - 5.11 MIL/uL   Hemoglobin 10.5 (L) 12.0 - 15.0 g/dL   HCT 32.5 (L) 36.0 - 46.0 %   MCV 89.8 80.0 - 100.0 fL   MCH 29.0 26.0 - 34.0 pg   MCHC 32.3 30.0 - 36.0 g/dL   RDW 13.6 11.5 - 15.5 %   Platelets 238 150 - 400 K/uL    Comment: REPEATED TO VERIFY   nRBC 0.0 0.0 - 0.2 %   Neutrophils Relative % 84 %   Neutro Abs 17.4 (H) 1.7 - 7.7 K/uL   Lymphocytes Relative 6 %   Lymphs Abs 1.2 0.7 - 4.0 K/uL   Monocytes Relative 9 %   Monocytes Absolute 1.9 (H) 0.1 - 1.0 K/uL   Eosinophils Relative 0 %   Eosinophils Absolute 0.0 0.0 - 0.5 K/uL   Basophils Relative 0 %   Basophils Absolute 0.1 0.0 - 0.1 K/uL   Immature Granulocytes 1 %   Abs Immature Granulocytes 0.19 (H) 0.00 - 0.07 K/uL    Comment: Performed at [facility] [address], [place], [place] [address]  Lactic acid, plasma     Status: None   Collection Time: 04/27/21 12:28 PM  Result Value Ref Range   Lactic Acid, Venous 1.8 0.5 - 1.9 mmol/L    Comment: Performed at [facility] [address], [place], [place] [address]  Protime-INR     Status: None   Collection Time: 04/27/21 12:28 PM  Result Value Ref Range   Prothrombin Time 14.3 11.4 - 15.2 seconds   INR 1.1 0.8 - 1.2    Comment: (NOTE) INR goal varies based on device and disease states. Performed at [facility], [place] [address], [place], [place] [address]   Resp Panel by RT-PCR (Flu A&B, Covid) Nasopharyngeal Swab     Status: Abnormal   Collection Time: 04/27/21  4:40 PM   Specimen: Nasopharyngeal Swab; Nasopharyngeal(NP) swabs in vial transport medium  Result Value Ref Range   SARS Coronavirus 2 by RT PCR POSITIVE (A) NEGATIVE    Comment: RESULT CALLED TO, READ BACK BY AND  VERIFIED WITHTish Men RN [id number] 04/27/21 A BROWNING (NOTE) SARS-CoV-2 target nucleic acids are DETECTED.  The SARS-CoV-2 RNA is generally detectable in  upper respiratory specimens during the acute phase of infection. Positive results are indicative of the presence of the identified virus, but do not rule out bacterial infection or co-infection with other pathogens not detected by the test. Clinical correlation with patient history and other diagnostic information is necessary to determine patient infection status. The expected result is Negative.  Fact Sheet for Patients: EntrepreneurPulse.com.au  Fact Sheet for Healthcare Providers: IncredibleEmployment.be  This test is not yet approved or cleared by the [place] FDA and  has been authorized for detection and/or diagnosis of SARS-CoV-2 by FDA under an Emergency Use Authorization (EUA).  This EUA will remain in effect (meaning this test can  be used) for the duration of  the COVID-19 declaration under Section 564(b)(1) of the Act, 21 U.S.C. section 360bbb-3(b)(1), unless the authorization is terminated or revoked sooner.     Influenza A by PCR NEGATIVE NEGATIVE   Influenza B by PCR NEGATIVE NEGATIVE    Comment: (NOTE) The Xpert Xpress SARS-CoV-2/FLU/RSV plus assay is intended as an aid in the diagnosis of influenza from Nasopharyngeal swab specimens and should not be used as a sole basis for treatment. Nasal washings and aspirates are unacceptable for Xpert Xpress SARS-CoV-2/FLU/RSV testing.  Fact Sheet for Patients: EntrepreneurPulse.com.au  Fact Sheet for Healthcare Providers: IncredibleEmployment.be  This test is not yet approved or cleared by the [place] FDA and has been authorized for detection and/or diagnosis of SARS-CoV-2 by FDA under an Emergency Use Authorization (EUA). This EUA will remain in effect (meaning this test can be used) for the duration of the COVID-19 declaration under Section 564(b)(1) of the Act, 21 U.S.C. section 360bbb-3(b)(1), unless the authorization is terminated  or revoked.  Performed at [facility], [place] [address], [place], [place] [address]   CBG monitoring, ED     Status: None   Collection Time: 04/27/21  5:15 PM  Result Value Ref Range   Glucose-Capillary 82 70 - 99 mg/dL    Comment: Glucose reference range applies only to samples taken after fasting for at least 8 hours.  Lactic acid, plasma     Status: None   Collection Time: 04/27/21  6:11 PM  Result Value Ref Range   Lactic Acid, Venous 1.7 0.5 - 1.9 mmol/L    Comment: Performed at [facility] [address], [place], [place] [address]    DG Foot Complete Left  Result Date: 04/27/2021 CLINICAL DATA:  Wound along the left third toe. History of diabetes. 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 Irregularity of the adjacent soft tissues noted. There is gas tracking in the soft tissues of the proximal toe around the proximal metaphysis of the proximal phalanx of the third toe, indicating soft tissue infection. There is shortening of the proximal phalanx of the fourth toe with absence of the prior head of the proximal phalanx related to the fractures shown on 09/04/2019. Old deformity of the fifth metatarsal compatible with old healed fracture. Accessory navicular. Plantar and Achilles calcaneal spurs. Atherosclerotic calcification. Dorsal midfoot spurring. IMPRESSION: 1. 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 Abnormal gas tracking in the soft tissues of the proximal third toe. 2. Old deformities of the proximal phalanx fourth toe and  of the fifth metatarsal resulting from prior fractures. 3. Plantar and Achilles calcaneal spurs. 4. Atherosclerosis. Electronically Signed   By: Van Clines M.D.   On: 04/27/2021 13:17    ROS Blood pressure (!) 158/64, pulse 98, temperature 99.8 F (37.7 C), temperature source Oral, resp. rate 17,  weight 68.5 kg, SpO2 100 %.  Vitals:   04/27/21 1730 04/27/21 1815  BP: (!) 141/42 (!) 158/64  Pulse: 68 98  Resp: (!) 24 17  Temp:  99.8 F (37.7 C)  SpO2: 100% 100%    General AA&O x3. Normal mood and affect.  Vascular Dorsalis pedis 1+ bilat, and posterior tibial pulses  non-palp Pedal hair growth diminished.  Neurologic Epicritic sensation grossly present.  Dermatologic (Wound) Wound Location: 3rd toe Wound Base: necrotic Peri-wound: Reddened Exudate: Scant/small amount Serosanguinous exudate Streaking, lymphangitis, local erythema. No ST crepitus.  Orthopedic: Motor intact BLE.    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 Order for consent in. Case posted for around noon tomorrow. NPO after midnight.  Will continue to follow. Thank you for the consult.  Evelina Bucy 04/27/2021, 6:56 PM   Best available via secure chat for questions or concerns.

## 2021-04-27 NOTE — Plan of Care (Signed)
?  Problem: Clinical Measurements: ?Goal: Ability to maintain clinical measurements within normal limits will improve ?Outcome: Progressing ?Goal: Will remain free from infection ?Outcome: Progressing ?Goal: Diagnostic test results will improve ?Outcome: Progressing ?  ?

## 2021-04-28 ENCOUNTER — Inpatient Hospital Stay (HOSPITAL_COMMUNITY): Payer: Medicare Other

## 2021-04-28 ENCOUNTER — Inpatient Hospital Stay (HOSPITAL_COMMUNITY)
Admission: EM | Disposition: A | Discharge status: Home Health | Payer: Self-pay | Source: Home / Self Care | Attending: Internal Medicine

## 2021-04-28 ENCOUNTER — Encounter (HOSPITAL_COMMUNITY)
Admission: EM | Disposition: A | Discharge status: Home Health | Payer: Self-pay | Source: Home / Self Care | Attending: Internal Medicine

## 2021-04-28 ENCOUNTER — Encounter (HOSPITAL_COMMUNITY): Payer: Medicare Other

## 2021-04-28 DIAGNOSIS — I96 Gangrene, not elsewhere classified: Secondary | ICD-10-CM

## 2021-04-28 DIAGNOSIS — M86172 Other acute osteomyelitis, left ankle and foot: Secondary | ICD-10-CM

## 2021-04-28 DIAGNOSIS — I739 Peripheral vascular disease, unspecified: Secondary | ICD-10-CM

## 2021-04-28 HISTORY — PX: ABDOMINAL AORTOGRAM W/LOWER EXTREMITY: CATH118223

## 2021-04-28 LAB — POCT I-STAT, CHEM 8
BUN: 25 mg/dL — ABNORMAL HIGH (ref 8–23)
Calcium, Ion: 1.26 mmol/L (ref 1.15–1.40)
Chloride: 105 mmol/L (ref 98–111)
Creatinine, Ser: 1.2 mg/dL — ABNORMAL HIGH (ref 0.44–1.00)
Glucose, Bld: 106 mg/dL — ABNORMAL HIGH (ref 70–99)
HCT: 29 % — ABNORMAL LOW (ref 36.0–46.0)
Hemoglobin: 9.9 g/dL — ABNORMAL LOW (ref 12.0–15.0)
Potassium: 3 mmol/L — ABNORMAL LOW (ref 3.5–5.1)
Sodium: 138 mmol/L (ref 135–145)
TCO2: 22 mmol/L (ref 22–32)

## 2021-04-28 LAB — CBC
HCT: 28.2 % — ABNORMAL LOW (ref 36.0–46.0)
Hemoglobin: 9.4 g/dL — ABNORMAL LOW (ref 12.0–15.0)
MCH: 29.2 pg (ref 26.0–34.0)
MCHC: 33.3 g/dL (ref 30.0–36.0)
MCV: 87.6 fL (ref 80.0–100.0)
Platelets: 204 10*3/uL (ref 150–400)
RBC: 3.22 MIL/uL — ABNORMAL LOW (ref 3.87–5.11)
RDW: 13.6 % (ref 11.5–15.5)
WBC: 14.4 10*3/uL — ABNORMAL HIGH (ref 4.0–10.5)
nRBC: 0 % (ref 0.0–0.2)

## 2021-04-28 LAB — BASIC METABOLIC PANEL
Anion gap: 9 (ref 5–15)
BUN: 35 mg/dL — ABNORMAL HIGH (ref 8–23)
CO2: 23 mmol/L (ref 22–32)
Calcium: 8.6 mg/dL — ABNORMAL LOW (ref 8.9–10.3)
Chloride: 103 mmol/L (ref 98–111)
Creatinine, Ser: 1.25 mg/dL — ABNORMAL HIGH (ref 0.44–1.00)
GFR, Estimated: 44 mL/min — ABNORMAL LOW (ref >60)
Glucose, Bld: 102 mg/dL — ABNORMAL HIGH (ref 70–99)
Potassium: 3.4 mmol/L — ABNORMAL LOW (ref 3.5–5.1)
Sodium: 135 mmol/L (ref 135–145)

## 2021-04-28 LAB — SURGICAL PCR SCREEN
MRSA, PCR: NEGATIVE
Staphylococcus aureus: POSITIVE — AB

## 2021-04-28 LAB — LIPID PANEL
Cholesterol: 86 mg/dL (ref 0–200)
HDL: 26 mg/dL — ABNORMAL LOW (ref >40)
LDL Cholesterol: 42 mg/dL (ref 0–99)
Total CHOL/HDL Ratio: 3.3 RATIO
Triglycerides: 89 mg/dL (ref <150)
VLDL: 18 mg/dL (ref 0–40)

## 2021-04-28 LAB — GLUCOSE, CAPILLARY
Glucose-Capillary: 56 mg/dL — ABNORMAL LOW (ref 70–99)
Glucose-Capillary: 123 mg/dL — ABNORMAL HIGH (ref 70–99)
Glucose-Capillary: 48 mg/dL — ABNORMAL LOW (ref 70–99)
Glucose-Capillary: 56 mg/dL — ABNORMAL LOW (ref 70–99)
Glucose-Capillary: 125 mg/dL — ABNORMAL HIGH (ref 70–99)
Glucose-Capillary: 121 mg/dL — ABNORMAL HIGH (ref 70–99)

## 2021-04-28 LAB — POCT ACTIVATED CLOTTING TIME
Activated Clotting Time: 219 seconds
Activated Clotting Time: 144 seconds

## 2021-04-28 SURGERY — AMPUTATION, TOE
Anesthesia: Monitor Anesthesia Care | Site: Toe | Laterality: Left

## 2021-04-28 SURGERY — ABDOMINAL AORTOGRAM W/LOWER EXTREMITY
Anesthesia: LOCAL

## 2021-04-28 MED ORDER — DEXTROSE 5 % IV SOLN: INTRAVENOUS | Status: AC

## 2021-04-28 MED ORDER — SODIUM CHLORIDE 0.9% FLUSH
3.0000 mL | Freq: Two times a day (BID) | INTRAVENOUS | Status: DC
  Administered 2021-04-28 – 2021-05-15 (×23): 3 mL via INTRAVENOUS

## 2021-04-28 MED ORDER — PROTAMINE SULFATE 10 MG/ML IV SOLN
INTRAVENOUS | Status: AC
  Filled 2021-04-28: qty 5

## 2021-04-28 MED ORDER — OXYCODONE HCL 5 MG PO TABS
5.0000 mg | ORAL_TABLET | ORAL | Status: DC | PRN
  Administered 2021-05-01 – 2021-05-02 (×2): 10 mg via ORAL
  Administered 2021-05-03: 5 mg via ORAL
  Administered 2021-05-03 – 2021-05-14 (×12): 10 mg via ORAL
  Filled 2021-04-28 (×18): qty 2

## 2021-04-28 MED ORDER — HYDRALAZINE HCL 20 MG/ML IJ SOLN
5.0000 mg | INTRAMUSCULAR | Status: AC | PRN
  Administered 2021-05-11 – 2021-05-13 (×2): 5 mg via INTRAVENOUS
  Filled 2021-04-28 (×2): qty 1

## 2021-04-28 MED ORDER — SODIUM CHLORIDE 0.9 % IV SOLN: 250.0000 mL | INTRAVENOUS | Status: DC | PRN

## 2021-04-28 MED ORDER — MORPHINE SULFATE (PF) 2 MG/ML IV SOLN
2.0000 mg | INTRAVENOUS | Status: DC | PRN
Start: 2021-04-28 — End: 2021-05-15
  Administered 2021-05-03 – 2021-05-15 (×4): 2 mg via INTRAVENOUS
  Filled 2021-04-28 (×4): qty 1

## 2021-04-28 MED ORDER — HEPARIN SODIUM (PORCINE) 1000 UNIT/ML IJ SOLN
INTRAMUSCULAR | Status: AC
  Filled 2021-04-28: qty 1

## 2021-04-28 MED ORDER — SODIUM CHLORIDE 0.9% FLUSH: 3.0000 mL | INTRAVENOUS | Status: DC | PRN

## 2021-04-28 MED ORDER — DEXTROSE 50 % IV SOLN
25.0000 g | Freq: Once | INTRAVENOUS | Status: AC
  Administered 2021-04-28: 25 g via INTRAVENOUS

## 2021-04-28 MED ORDER — IODIXANOL 320 MG/ML IV SOLN
INTRAVENOUS | Status: DC | PRN
  Administered 2021-04-28: 165 mL via INTRA_ARTERIAL

## 2021-04-28 MED ORDER — SODIUM CHLORIDE 0.9 % IV SOLN: INTRAVENOUS | Status: AC

## 2021-04-28 MED ORDER — ACETAMINOPHEN 325 MG PO TABS
650.0000 mg | ORAL_TABLET | ORAL | Status: DC | PRN
  Administered 2021-04-29: 650 mg via ORAL
  Filled 2021-04-28 (×4): qty 2

## 2021-04-28 MED ORDER — DEXTROSE 50 % IV SOLN
25.0000 g | INTRAVENOUS | Status: AC
  Filled 2021-04-28: qty 50

## 2021-04-28 MED ORDER — DEXTROSE 50 % IV SOLN
INTRAVENOUS | Status: AC
  Administered 2021-04-28: 25 mL
  Filled 2021-04-28: qty 50

## 2021-04-28 MED ORDER — HEPARIN (PORCINE) IN NACL 1000-0.9 UT/500ML-% IV SOLN
INTRAVENOUS | Status: AC
  Filled 2021-04-28: qty 1000

## 2021-04-28 MED ORDER — HEPARIN SODIUM (PORCINE) 1000 UNIT/ML IJ SOLN
INTRAMUSCULAR | Status: DC | PRN
  Administered 2021-04-28: 7000 [IU] via INTRAVENOUS

## 2021-04-28 MED ORDER — ONDANSETRON HCL 4 MG/2ML IJ SOLN: 4.0000 mg | Freq: Four times a day (QID) | INTRAMUSCULAR | Status: DC | PRN

## 2021-04-28 MED ORDER — LABETALOL HCL 5 MG/ML IV SOLN
10.0000 mg | INTRAVENOUS | Status: AC | PRN
  Administered 2021-05-07 – 2021-05-11 (×3): 10 mg via INTRAVENOUS
  Filled 2021-04-28 (×3): qty 4

## 2021-04-28 MED ORDER — LIDOCAINE HCL (PF) 1 % IJ SOLN
INTRAMUSCULAR | Status: AC
  Filled 2021-04-28: qty 30

## 2021-04-28 MED ORDER — PROTAMINE SULFATE 10 MG/ML IV SOLN
INTRAVENOUS | Status: DC | PRN
Start: 2021-04-28 — End: 2021-04-28
  Administered 2021-04-28: 5 mg via INTRAVENOUS
  Administered 2021-04-28: 25 mg via INTRAVENOUS

## 2021-04-28 MED ORDER — VANCOMYCIN HCL 750 MG/150ML IV SOLN
750.0000 mg | INTRAVENOUS | Status: DC
  Administered 2021-04-28 – 2021-04-30 (×3): 750 mg via INTRAVENOUS
  Administered 2021-05-01: 1250 mg via INTRAVENOUS
  Administered 2021-05-02 – 2021-05-04 (×3): 750 mg via INTRAVENOUS
  Filled 2021-04-28 (×9): qty 150

## 2021-04-28 SURGICAL SUPPLY — 12 items
CATH ANGIO 5F PIGTAIL 65CM (CATHETERS) ×1 IMPLANT
GLIDEWIRE NITREX 0.018X80X5 (WIRE) ×2
GUIDEWIRE NITREX 0.018X80X5 (WIRE) IMPLANT
KIT MICROPUNCTURE NIT STIFF (SHEATH) ×1 IMPLANT
KIT PV (KITS) ×2 IMPLANT
SHEATH PINNACLE 5F 10CM (SHEATH) ×1 IMPLANT
SHEATH PINNACLE 6F 10CM (SHEATH) ×1 IMPLANT
SHEATH PROBE COVER 6X72 (BAG) ×1 IMPLANT
SYR MEDRAD MARK 7 150ML (SYRINGE) ×2 IMPLANT
TRANSDUCER W/STOPCOCK (MISCELLANEOUS) ×2 IMPLANT
TRAY PV CATH (CUSTOM PROCEDURE TRAY) ×2 IMPLANT
WIRE HI TORQ VERSACORE J 260CM (WIRE) ×1 IMPLANT

## 2021-04-28 NOTE — Progress Notes (Signed)
Patient to 4E16 from cath lab. On monitor CCMD notified. Vital signs stable. R groin level 0. CHG bath completed. Alert and oriented to room and call light. Call bell within reach.  Era Bumpers, RN

## 2021-04-28 NOTE — Progress Notes (Addendum)
Date and time results received: 04/28/21 0640   Test: CBG Critical Value: 56mg /dL  Pt denies any symptoms. RN will continue to monitor pt.  Name of Provider Notified: Kathryne Eriksson, NP  Orders Received? Or Actions Taken?: D50 administered. CBG will be checked in 62mins  0740 Pt CBG is 123mg /dL.

## 2021-04-28 NOTE — Progress Notes (Signed)
Pharmacy Antibiotic Note  [name] is a 80 y.o. female admitted on 04/27/2021 with  osteomyelitis .  Pharmacy has been consulted for vancomycin dosing; also on Zosyn. Patient stubbed her toe on furniture on 8/1 and was seen by her PCP 8/4 who sent her here as her toe is necrotic. WBC down 14.4, Tmax 100.7, and renal function has improved, SCr down 1.25. Plan is for angiogram today with possible amputation next week.  Plan: Vancomycin 750 mg IV q24h Goal AUC 400-550. Expected AUC: 488.9 SCr used: 1.25  Monitor renal function, clinical status, and abx plan.  Weight: 68.5 kg (151 lb 0.2 oz)  Temp (24hrs), Avg:99.7 F (37.6 C), Min:98.7 F (37.1 C), Max:100.7 F (38.2 C)  Recent Labs  Lab 04/27/21 1228 04/27/21 1811 04/28/21 0243  WBC 20.8*  --  14.4*  CREATININE 1.41*  --  1.25*  LATICACIDVEN 1.8 1.7  --      Estimated Creatinine Clearance: 35.1 mL/min (A) (by C-G formula based on SCr of 1.25 mg/dL (H)).    Allergies  Allergen Reactions   Bactrim [Sulfamethoxazole-Trimethoprim] Other (See Comments)    ^ K+( elevated)    Demerol [Meperidine]     Delusional    Scopolamine     Delusional    Tramadol Other (See Comments)    Keeps awake   Versed [Midazolam] Anxiety    Frantic, out of my mind, agitated     Antimicrobials this admission: Zosyn 8/4 >> Vancomycin 8/4 >>  Dose adjustments this admission:   Microbiology results: 8/4 BCx: ngtd   Thank you for involving pharmacy in this patient's care.  Renold Genta, PharmD, BCPS Clinical Pharmacist Clinical phone for 04/28/2021 until 3p is [phone] 04/28/2021 1:25 PM  **Pharmacist phone directory can be found on [email or web address] listed under [place]**

## 2021-04-28 NOTE — Progress Notes (Signed)
Left lower extremity arterial duplex completed. Refer to "CV Proc" under chart review to view preliminary results.  04/28/2021 12:53 PM Kelby Aline., MHA, RVT, RDCS, RDMS

## 2021-04-28 NOTE — Interval H&P Note (Signed)
History and Physical Interval Note:  04/28/2021 3:31 PM  [name]  has presented today for surgery, with the diagnosis of Osteomyelitis.  The various methods of treatment have been discussed with the patient and family. After consideration of risks, benefits and other options for treatment, the patient has consented to  Aortogram with runoff possible intervention.  The patient's history has been reviewed, patient examined, no change in status, stable for surgery.  I have reviewed the patient's chart and labs.  Questions were answered to the patient's satisfaction.     Ruta Hinds

## 2021-04-28 NOTE — Progress Notes (Signed)
PROGRESS NOTE    [name]  KDX:[id number] DOB: Jul 08, 1941 DOA: 04/27/2021 PCP: [name], MD  Brief Narrative:[name] is a 80 y.o. female with medical history significant of hypertension, hyperlipidemia, CAD s/p CABG in 2021, PVD s/p bypass grafting, diabetes mellitus type II, ray amputation of right foot presents with left third toe wound.  She had initially scraped her third toe on a barstool 3 days ago.  Subsequently developed redness pain and streaking going up her left leg associated with fevers and chills . She is s/p right common femoral endarterectomy with femoral popliteal bypass graft on 08/16/2020.  And previously had angioplasty with stent continue to have the left SFA. In the ED she was febrile, mildly tachycardic, WBC was 20 point 8K, x-ray of the left foot noted osteomyelitis of distal phalanx third toe     Assessment & Plan:   Left great toe gangrene Osteomyelitis Sepsis, poa -Continue vancomycin and Zosyn today -Follow-up blood cultures -VVS and podiatry following, plan for angiogram today, will likely need toe amputation early next week  Acute kidney injury -Prerenal, resolving, cut down IV fluids -Creatinine down to 1.2 today  SARS COVID-19 infection -Asymptomatic, monitor  Essential hypertension -Continue metoprolol, holding HCTZ and Micardis  Type 2 diabetes mellitus -Hemoglobin A1c was 7.7 in April -Oral hypoglycemics on hold, continue sliding scale insulin for now  Chronic anemia -Monitor  CAD: s/p CABG in 03/2020 -Continue statin, aspirin, Plavix   Peripheral vascular disease: Patient is status post right foot ray amputation back in 07/2019. -Continue statin, Plavix, and aspirin  DVT prophylaxis: Lovenox Code Status: Full code Family Communication: Discussed patient in detail, no family at bedside Disposition Plan:  Status is: Inpatient  Remains inpatient appropriate because:Inpatient level of care appropriate due to  severity of illness  Dispo: The patient is from: Home              Anticipated d/c is to: Home              Patient currently is not medically stable to d/c.   Difficult to place patient No   Consultants:  Podiatry Vascular surgery  Procedures:   Antimicrobials:    Subjective: -Feels better today, no further chills  Objective: Vitals:   04/27/21 1730 04/27/21 1815 04/27/21 2120 04/28/21 0737  BP: (!) 141/42 (!) 158/64 (!) 165/46 (!) 141/49  Pulse: 68 98 (!) 103 94  Resp: (!) 24 17  18   Temp:  99.8 F (37.7 C) 98.7 F (37.1 C) 99.6 F (37.6 C)  TempSrc:  Oral Oral   SpO2: 100% 100% 97% 96%  Weight:        Intake/Output Summary (Last 24 hours) at 04/28/2021 1057 Last data filed at 04/27/2021 2133 Gross per 24 hour  Intake 497.57 ml  Output --  Net 497.57 ml   Filed Weights   04/27/21 1500  Weight: 68.5 kg    Examination:    General exam: Appears calm and comfortable  Respiratory system: Clear to auscultation Cardiovascular system: S1 & S2 heard, RRR.  Abd: nondistended, soft and nontender.Normal bowel sounds heard. Central nervous system: Alert and oriented. No focal neurological deficits. Extremities: Black discoloration of the distal third toe, diminished dorsalis pedis pulses Skin: As above Psychiatry:  Mood & affect appropriate.     Data Reviewed:   CBC: Recent Labs  Lab 04/27/21 1228 04/28/21 0243  WBC 20.8* 14.4*  NEUTROABS 17.4*  --   HGB 10.5* 9.4*  HCT 32.5* 28.2*  MCV 89.8 87.6  PLT 238 829   Basic Metabolic Panel: Recent Labs  Lab 04/27/21 1228 04/28/21 0243  NA 132* 135  K 3.8 3.4*  CL 97* 103  CO2 22 23  GLUCOSE 165* 102*  BUN 37* 35*  CREATININE 1.41* 1.25*  CALCIUM 9.4 8.6*   GFR: Estimated Creatinine Clearance: 35.1 mL/min (A) (by C-G formula based on SCr of 1.25 mg/dL (H)). Liver Function Tests: No results for input(s): AST, ALT, ALKPHOS, BILITOT, PROT, ALBUMIN in the last 168 hours. No results for input(s):  LIPASE, AMYLASE in the last 168 hours. No results for input(s): AMMONIA in the last 168 hours. Coagulation Profile: Recent Labs  Lab 04/27/21 1228  INR 1.1   Cardiac Enzymes: No results for input(s): CKTOTAL, CKMB, CKMBINDEX, TROPONINI in the last 168 hours. BNP (last 3 results) No results for input(s): PROBNP in the last 8760 hours. HbA1C: Recent Labs    04/27/21 1811  HGBA1C 7.2*   CBG: Recent Labs  Lab 04/27/21 1715 04/27/21 2116 04/28/21 0634 04/28/21 0734  GLUCAP 82 147* 56* 123*   Lipid Profile: Recent Labs    04/28/21 0243  CHOL 86  HDL 26*  LDLCALC 42  TRIG 89  CHOLHDL 3.3   Thyroid Function Tests: No results for input(s): TSH, T4TOTAL, FREET4, T3FREE, THYROIDAB in the last 72 hours. Anemia Panel: No results for input(s): VITAMINB12, FOLATE, FERRITIN, TIBC, IRON, RETICCTPCT in the last 72 hours. Urine analysis:    Component Value Date/Time   COLORURINE YELLOW 08/10/2020 [place] 08/10/2020 0952   LABSPEC 1.018 08/10/2020 0952   PHURINE 5.0 08/10/2020 0952   GLUCOSEU >=500 (A) 08/10/2020 0952   HGBUR NEGATIVE 08/10/2020 0952   BILIRUBINUR NEGATIVE 08/10/2020 0952   KETONESUR NEGATIVE 08/10/2020 0952   PROTEINUR NEGATIVE 08/10/2020 0952   NITRITE NEGATIVE 08/10/2020 0952   LEUKOCYTESUR SMALL (A) 08/10/2020 0952   Sepsis Labs: @LABRCNTIP (procalcitonin:4,lacticidven:4)  ) Recent Results (from the past 240 hour(s))  Blood culture (routine x 2)     Status: None (Preliminary result)   Collection Time: 04/27/21 12:14 PM   Specimen: BLOOD RIGHT HAND  Result Value Ref Range Status   Specimen Description BLOOD RIGHT HAND  Final   Special Requests   Final    BOTTLES DRAWN AEROBIC AND ANAEROBIC Blood Culture adequate volume   Culture   Final    NO GROWTH < 24 HOURS Performed at [facility], [place] [address] [place], [place] [address]    Report Status PENDING  Incomplete  Blood culture (routine x 2)     Status: None  (Preliminary result)   Collection Time: 04/27/21 12:28 PM   Specimen: BLOOD LEFT ARM  Result Value Ref Range Status   Specimen Description BLOOD LEFT ARM  Final   Special Requests   Final    BOTTLES DRAWN AEROBIC AND ANAEROBIC Blood Culture adequate volume   Culture   Final    NO GROWTH < 24 HOURS Performed at [facility], [place] [address], [place], [place] [address]    Report Status PENDING  Incomplete  Resp Panel by RT-PCR (Flu A&B, Covid) Nasopharyngeal Swab     Status: Abnormal   Collection Time: 04/27/21  4:40 PM   Specimen: Nasopharyngeal Swab; Nasopharyngeal(NP) swabs in vial transport medium  Result Value Ref Range Status   SARS Coronavirus 2 by RT PCR POSITIVE (A) NEGATIVE Final    Comment: RESULT CALLED TO, READ BACK BY AND VERIFIED WITH: Tish Men RN [phone]  04/27/21 A BROWNING (NOTE) SARS-CoV-2 target nucleic acids are DETECTED.  The SARS-CoV-2 RNA is generally detectable in upper respiratory specimens during the acute phase of infection. Positive results are indicative of the presence of the identified virus, but do not rule out bacterial infection or co-infection with other pathogens not detected by the test. Clinical correlation with patient history and other diagnostic information is necessary to determine patient infection status. The expected result is Negative.  Fact Sheet for Patients: EntrepreneurPulse.com.au  Fact Sheet for Healthcare Providers: IncredibleEmployment.be  This test is not yet approved or cleared by the [place] FDA and  has been authorized for detection and/or diagnosis of SARS-CoV-2 by FDA under an Emergency Use Authorization (EUA).  This EUA will remain in effect (meaning this test can  be used) for the duration of  the COVID-19 declaration under Section 564(b)(1) of the Act, 21 U.S.C. section 360bbb-3(b)(1), unless the authorization is terminated or revoked sooner.     Influenza A by PCR  NEGATIVE NEGATIVE Final   Influenza B by PCR NEGATIVE NEGATIVE Final    Comment: (NOTE) The Xpert Xpress SARS-CoV-2/FLU/RSV plus assay is intended as an aid in the diagnosis of influenza from Nasopharyngeal swab specimens and should not be used as a sole basis for treatment. Nasal washings and aspirates are unacceptable for Xpert Xpress SARS-CoV-2/FLU/RSV testing.  Fact Sheet for Patients: EntrepreneurPulse.com.au  Fact Sheet for Healthcare Providers: IncredibleEmployment.be  This test is not yet approved or cleared by the [place] FDA and has been authorized for detection and/or diagnosis of SARS-CoV-2 by FDA under an Emergency Use Authorization (EUA). This EUA will remain in effect (meaning this test can be used) for the duration of the COVID-19 declaration under Section 564(b)(1) of the Act, 21 U.S.C. section 360bbb-3(b)(1), unless the authorization is terminated or revoked.  Performed at [facility], [place] [address] [place], [place] [address]   Surgical PCR screen     Status: Abnormal   Collection Time: 04/27/21  9:31 PM   Specimen: Nasal Mucosa; Nasal Swab  Result Value Ref Range Status   MRSA, PCR NEGATIVE NEGATIVE Final   Staphylococcus aureus POSITIVE (A) NEGATIVE Final    Comment: (NOTE) The Xpert SA Assay (FDA approved for NASAL specimens in patients 27 years of age and older), is one component of a comprehensive surveillance program. It is not intended to diagnose infection nor to guide or monitor treatment. Performed at [facility], [place] [address], [place], [place] [address]          Radiology Studies: DG Foot Complete Left  Result Date: 04/27/2021 CLINICAL DATA:  Wound along the left third toe. History of diabetes. 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  Irregularity of the adjacent soft tissues noted. There is gas tracking in the soft tissues of the proximal toe around the proximal metaphysis of the proximal phalanx of the third toe, indicating soft tissue infection. There is shortening of the proximal phalanx of the fourth toe with absence of the prior head of the proximal phalanx related to the fractures shown on 09/04/2019. Old deformity of the fifth metatarsal compatible with old healed fracture. Accessory navicular. Plantar and Achilles calcaneal spurs. Atherosclerotic calcification. Dorsal midfoot spurring. IMPRESSION: 1. 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 Abnormal gas tracking in the soft tissues of the proximal third toe. 2. Old deformities of  the proximal phalanx fourth toe and of the fifth metatarsal resulting from prior fractures. 3. Plantar and Achilles calcaneal spurs. 4. Atherosclerosis. Electronically Signed   By: Van Clines M.D.   On: 04/27/2021 13:17        Scheduled Meds:  ascorbic acid  250 mg Oral Q supper   aspirin EC  81 mg Oral QHS   atorvastatin  80 mg Oral Daily   brimonidine  1 drop Right Eye BID   clopidogrel  75 mg Oral Daily   enoxaparin (LOVENOX) injection  40 mg Subcutaneous q1600   insulin aspart  0-15 Units Subcutaneous TID WC   metoprolol tartrate  37.5 mg Oral BID   mupirocin ointment  1 application Nasal BID   pantoprazole  40 mg Oral Once per day on Mon Wed Fri   pyridoxine  100 mg Oral QPM   saccharomyces boulardii  250 mg Oral BID   sodium chloride flush  3 mL Intravenous Q12H   vancomycin variable dose per unstable renal function (pharmacist dosing)   Does not apply See admin instructions   Continuous Infusions:  piperacillin-tazobactam 3.375 g (04/28/21 1024)     LOS: 1 day    Time spent: 44min  Domenic Polite, MD [facility]   04/28/2021, 10:57 AM

## 2021-04-28 NOTE — Progress Notes (Addendum)
2218 Pt went into ventricular standstill for 8.4 seconds. CCMD notified RN. Pt stated nausea but resolved quickly. Clarene Essex, NP notified. Awaiting orders. RN will continue to monitor patients.  2220 Orders received.

## 2021-04-28 NOTE — Progress Notes (Signed)
  Progress Note    04/28/2021 7:34 AM Day of Surgery  Subjective: No overnight issues  Vitals:   04/27/21 1815 04/27/21 2120  BP: (!) 158/64 (!) 165/46  Pulse: 98 (!) 103  Resp: 17   Temp: 99.8 F (37.7 C) 98.7 F (37.1 C)  SpO2: 100% 97%    Physical Exam: Awake alert oriented Nonlabored respirations Left foot with gangrenous third toe  CBC    Component Value Date/Time   WBC 14.4 (H) 04/28/2021 0243   RBC 3.22 (L) 04/28/2021 0243   HGB 9.4 (L) 04/28/2021 0243   HGB 12.2 07/29/2020 0942   HCT 28.2 (L) 04/28/2021 0243   HCT 37.4 07/29/2020 0942   PLT 204 04/28/2021 0243   PLT 304 07/29/2020 0942   MCV 87.6 04/28/2021 0243   MCV 84 07/29/2020 0942   MCH 29.2 04/28/2021 0243   MCHC 33.3 04/28/2021 0243   RDW 13.6 04/28/2021 0243   RDW 14.8 07/29/2020 0942   LYMPHSABS 1.2 04/27/2021 1228   MONOABS 1.9 (H) 04/27/2021 1228   EOSABS 0.0 04/27/2021 1228   BASOSABS 0.1 04/27/2021 1228    BMET    Component Value Date/Time   NA 135 04/28/2021 0243   NA 142 07/29/2020 0942   K 3.4 (L) 04/28/2021 0243   CL 103 04/28/2021 0243   CO2 23 04/28/2021 0243   GLUCOSE 102 (H) 04/28/2021 0243   BUN 35 (H) 04/28/2021 0243   BUN 17 07/29/2020 0942   CREATININE 1.25 (H) 04/28/2021 0243   CREATININE 0.86 12/09/2019 1136   CALCIUM 8.6 (L) 04/28/2021 0243   GFRNONAA 44 (L) 04/28/2021 0243   GFRAA 75 07/29/2020 0942    INR    Component Value Date/Time   INR 1.1 04/27/2021 1228     Intake/Output Summary (Last 24 hours) at 04/28/2021 0734 Last data filed at 04/27/2021 2133 Gross per 24 hour  Intake 497.57 ml  Output --  Net 497.57 ml     Assessment/plan:  80 y.o. female is here with gangrenous changes of the left third toe.  She has a well-healed right transmetatarsal amputation after femoropopliteal bypass.  There is a palpable popliteal pulse on the left.  We will begin with angiography from a right common femoral approach possibly intervening on the left today.  ABIs  and duplex are pending hopefully will be performed prior to procedure.  Patient does have asymptomatic COVID will need to be the last case of the day.   Jennavecia Schwier C. Donzetta Matters, MD [facility] Office: [phone] Pager: [phone]  04/28/2021 7:34 AM

## 2021-04-28 NOTE — Progress Notes (Signed)
Inpatient Diabetes Program Recommendations  AACE/ADA: New Consensus Statement on Inpatient Glycemic Control (2015)  Target Ranges:  Prepandial:   less than 140 mg/dL      Peak postprandial:   less than 180 mg/dL (1-2 hours)      Critically ill patients:  140 - 180 mg/dL   Lab Results  Component Value Date   GLUCAP 123 (H) 04/28/2021   HGBA1C 7.2 (H) 04/27/2021    Review of Glycemic Control Results for [name], [name] (MRN [id number]) as of 04/28/2021 08:08  Ref. Range 04/28/2021 06:34  Glucose-Capillary Latest Ref Range: 70 - 99 mg/dL 56 (L)   Diabetes history: type 2 DM Outpatient Diabetes medications: Amaryl 2 mg QAM, Metformin 1000 mg BID, Invokana 300 mg QD Current orders for Inpatient glycemic control: Novolog 0-15 units TID  Inpatient Diabetes Program Recommendations:    Consider decreasing Novolog 0-6 units TID.   Thanks, Bronson Curb, MSN, RNC-OB Diabetes Coordinator [phone] (8a-5p)

## 2021-04-28 NOTE — Op Note (Addendum)
Procedure: Ultrasound right groin, abdominal aortogram with bilateral lower extremity runoff  Preoperative diagnosis: Gangrene left foot  Postoperative diagnosis: Same  Anesthesia: Local  Operative findings: 1.  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  80% stenosis distal right external iliac artery above existing femoral above-knee popliteal bypass runoff not well visualized secondary to sheath being occlusive in the right external iliac artery  Operative details: After obtaining informed consent, the patient was taken to the [place] lab.  The patient was placed in supine position on the angio table.  Both groins were prepped and draped in usual sterile fashion.  Ultrasound was used to identify the patient's right common femoral artery and a pre-existing right femoral-popliteal bypass.  Local anesthesia was infiltrated over the common femoral artery just proximal to the bypass.  Initially I tried to use a micropuncture needle to access this and I could not get the micropuncture wire advanced.  Direct pressure was held over the groin for about 5 minutes.  I then proceeded to use a standard introducer needle and was able to cannulate the artery and advanced an 035 versa core wire up into the abdominal aorta.  A 5 French sheath was then placed over the guidewire after dilating the tract with a 6 French dilator due to previous scar tissue.  5 French pigtail catheter was advanced up into the abdominal aorta under fluoroscopic guidance.  Abdominal aortogram was then obtained in AP projection.  Left and right renal arteries are patent.  Infrarenal abdominal aorta is patent.  The left external common internal iliac arteries are patent.  The right common iliac artery is patent.  There is a subtotal occlusion of the right external iliac artery.  The  right internal iliac artery is patent.  At this point since the sheath on the right side was intentionally occlusive we administered 7000 units of intravenous heparin.  I then obtained bilateral lower extremity runoff views through the pigtail catheter.  In the left lower extremity the left common femoral profunda femoris and superficial femoral femoral arteries are patent.  The proximal superficial femoral artery on the left side has a stent extending about 7 cm from the origin of the SFA which is patent.  The remainder of the distal left superficial femoral artery and proximal popliteal artery is diffusely diseased with multiple segments of 50 to 90% stenosis.  The popliteal artery is occluded.  The below-knee popliteal artery then reconstitutes and gives off two-vessel runoff via the anterior tibial and posterior tibial artery to the left foot.  In the right lower extremity the right common femoral has what appears to be an endarterectomy which is patent.  Initially the right leg did not fill any further down in the common femoral.  I then removed the pigtail catheter and using the sheath in the right groin obtained several films pulling the sheath back where it was just inside the vessel on the right side.  There is a 90% stenosis of the distal external iliac artery.  The common femoral artery does not fill as well as the profunda.  There is very delayed filling of the left femoral to above-knee popliteal bypass.  There is no flow below the distal anastomosis of the femoral above-knee popliteal bypass.  Currently there was not enough room due to the area of the puncture site just above the femoropopliteal to  have enough room to stent the external iliac artery.  I discussed the findings and showed the images to Dr. Stanford Breed who is on-call this weekend.  The patient's right leg was dusky and mottled in appearance at the conclusion of the procedure.  We decided to administer protamine to reverse her heparin and  remove the sheath to see if the right leg perfusion improves.  Hopefully this is secondary to the sheath being occlusive rather than the femoropopliteal now being occluded.  If not she will need an emergent operation later today to thrombectomize her right femoropopliteal and a right femoral endarterectomy versus stent.  As far as the left leg is concerned she will most likely need a left femoral to below-knee popliteal bypass and a toe amputation.  All of this was discussed with the patient and Dr. Standley Dakins will follow up with her after the sheath has been removed.  Ruta Hinds, MD [facility] [place] [facility] Office: [phone]

## 2021-04-28 NOTE — H&P (View-Only) (Signed)
  Progress Note    04/28/2021 7:34 AM Day of Surgery  Subjective: No overnight issues  Vitals:   04/27/21 1815 04/27/21 2120  BP: (!) 158/64 (!) 165/46  Pulse: 98 (!) 103  Resp: 17   Temp: 99.8 F (37.7 C) 98.7 F (37.1 C)  SpO2: 100% 97%    Physical Exam: Awake alert oriented Nonlabored respirations Left foot with gangrenous third toe  CBC    Component Value Date/Time   WBC 14.4 (H) 04/28/2021 0243   RBC 3.22 (L) 04/28/2021 0243   HGB 9.4 (L) 04/28/2021 0243   HGB 12.2 07/29/2020 0942   HCT 28.2 (L) 04/28/2021 0243   HCT 37.4 07/29/2020 0942   PLT 204 04/28/2021 0243   PLT 304 07/29/2020 0942   MCV 87.6 04/28/2021 0243   MCV 84 07/29/2020 0942   MCH 29.2 04/28/2021 0243   MCHC 33.3 04/28/2021 0243   RDW 13.6 04/28/2021 0243   RDW 14.8 07/29/2020 0942   LYMPHSABS 1.2 04/27/2021 1228   MONOABS 1.9 (H) 04/27/2021 1228   EOSABS 0.0 04/27/2021 1228   BASOSABS 0.1 04/27/2021 1228    BMET    Component Value Date/Time   NA 135 04/28/2021 0243   NA 142 07/29/2020 0942   K 3.4 (L) 04/28/2021 0243   CL 103 04/28/2021 0243   CO2 23 04/28/2021 0243   GLUCOSE 102 (H) 04/28/2021 0243   BUN 35 (H) 04/28/2021 0243   BUN 17 07/29/2020 0942   CREATININE 1.25 (H) 04/28/2021 0243   CREATININE 0.86 12/09/2019 1136   CALCIUM 8.6 (L) 04/28/2021 0243   GFRNONAA 44 (L) 04/28/2021 0243   GFRAA 75 07/29/2020 0942    INR    Component Value Date/Time   INR 1.1 04/27/2021 1228     Intake/Output Summary (Last 24 hours) at 04/28/2021 0734 Last data filed at 04/27/2021 2133 Gross per 24 hour  Intake 497.57 ml  Output --  Net 497.57 ml     Assessment/plan:  80 y.o. female is here with gangrenous changes of the left third toe.  She has a well-healed right transmetatarsal amputation after femoropopliteal bypass.  There is a palpable popliteal pulse on the left.  We will begin with angiography from a right common femoral approach possibly intervening on the left today.  ABIs  and duplex are pending hopefully will be performed prior to procedure.  Patient does have asymptomatic COVID will need to be the last case of the day.   Emmalyne Giacomo C. Donzetta Matters, MD [facility] [place] [facility] Office: [phone] Pager: [phone]  04/28/2021 7:34 AM

## 2021-04-29 ENCOUNTER — Inpatient Hospital Stay (HOSPITAL_COMMUNITY): Payer: Medicare Other

## 2021-04-29 DIAGNOSIS — N186 End stage renal disease: Secondary | ICD-10-CM | POA: Diagnosis not present

## 2021-04-29 DIAGNOSIS — M86172 Other acute osteomyelitis, left ankle and foot: Secondary | ICD-10-CM | POA: Diagnosis not present

## 2021-04-29 LAB — CBC
HCT: 28.1 % — ABNORMAL LOW (ref 36.0–46.0)
Hemoglobin: 9.3 g/dL — ABNORMAL LOW (ref 12.0–15.0)
MCH: 29.2 pg (ref 26.0–34.0)
MCHC: 33.1 g/dL (ref 30.0–36.0)
MCV: 88.4 fL (ref 80.0–100.0)
Platelets: 215 10*3/uL (ref 150–400)
RBC: 3.18 MIL/uL — ABNORMAL LOW (ref 3.87–5.11)
RDW: 13.8 % (ref 11.5–15.5)
WBC: 14.2 10*3/uL — ABNORMAL HIGH (ref 4.0–10.5)
nRBC: 0 % (ref 0.0–0.2)

## 2021-04-29 LAB — GLUCOSE, CAPILLARY
Glucose-Capillary: 133 mg/dL — ABNORMAL HIGH (ref 70–99)
Glucose-Capillary: 64 mg/dL — ABNORMAL LOW (ref 70–99)
Glucose-Capillary: 162 mg/dL — ABNORMAL HIGH (ref 70–99)
Glucose-Capillary: 146 mg/dL — ABNORMAL HIGH (ref 70–99)
Glucose-Capillary: 200 mg/dL — ABNORMAL HIGH (ref 70–99)

## 2021-04-29 LAB — BASIC METABOLIC PANEL
Anion gap: 9 (ref 5–15)
BUN: 26 mg/dL — ABNORMAL HIGH (ref 8–23)
CO2: 20 mmol/L — ABNORMAL LOW (ref 22–32)
Calcium: 8.4 mg/dL — ABNORMAL LOW (ref 8.9–10.3)
Chloride: 104 mmol/L (ref 98–111)
Creatinine, Ser: 1.24 mg/dL — ABNORMAL HIGH (ref 0.44–1.00)
GFR, Estimated: 44 mL/min — ABNORMAL LOW (ref >60)
Glucose, Bld: 95 mg/dL (ref 70–99)
Potassium: 3.1 mmol/L — ABNORMAL LOW (ref 3.5–5.1)
Sodium: 133 mmol/L — ABNORMAL LOW (ref 135–145)

## 2021-04-29 MED ORDER — POTASSIUM CHLORIDE CRYS ER 20 MEQ PO TBCR
40.0000 meq | EXTENDED_RELEASE_TABLET | Freq: Once | ORAL | Status: AC
  Administered 2021-04-29: 40 meq via ORAL
  Filled 2021-04-29: qty 2

## 2021-04-29 NOTE — Progress Notes (Addendum)
PROGRESS NOTE    CORRINNA KARAPETYAN  RSW:[id number] DOB: [birth year] DOA: 04/27/2021 PCP: [name], MD  Brief Narrative:[name] is a 80 y.o. female with medical history significant of hypertension, hyperlipidemia, CAD s/p CABG in 2021, PVD s/p bypass grafting, diabetes mellitus type II, ray amputation of right foot presents with left third toe wound.  She had initially scraped her third toe on a barstool 3 days ago.  Subsequently developed redness pain and streaking going up her left leg associated with fevers and chills . She is s/p right common femoral endarterectomy with femoral popliteal bypass graft on 08/16/2020.  And previously had angioplasty with stent continue to have the left SFA. In the ED she was febrile, mildly tachycardic, WBC was 20 point 8K, x-ray of the left foot noted osteomyelitis of distal phalanx third toe     Assessment & Plan:   Left great toe gangrene Osteomyelitis Sepsis, poa -Remains on IV vancomycin and Zosyn day 2 -Blood cultures are negative -VVS and podiatry following,  -Underwent angiogram yesterday noted to have severe left superficial femoral artery occlusive disease with 3 cm occlusion of left popliteal artery, 80% stenosis of distal right external iliac artery above existing femoropopliteal bypass  -Plan for left femoral to below-knee popliteal bypass and toe amputation next week -Will hold Plavix  Acute kidney injury -Prerenal, resolving, off IV fluids now -Creatinine down to 1.2 today  SARS COVID-19 infection -Asymptomatic, monitor  Essential hypertension -Continue metoprolol, holding HCTZ and Micardis  Type 2 diabetes mellitus -Hemoglobin A1c was 7.7 in April -Hypoglycemic yesterday, now on a diet -Oral hypoglycemics on hold, continue sliding scale insulin for now  Chronic anemia -Monitor  CAD: s/p CABG in 03/2020 -Continue statin, aspirin,  -Hold Plavix for surgery  Peripheral vascular disease: Patient is status post right  foot ray amputation back in 07/2019. -Continue statin, and aspirin -Hold Plavix  DVT prophylaxis: Lovenox Code Status: Full code Family Communication: Discussed patient in detail, no family at bedside Disposition Plan:  Status is: Inpatient  Remains inpatient appropriate because:Inpatient level of care appropriate due to severity of illness  Dispo: The patient is from: Home              Anticipated d/c is to: Home              Patient currently is not medically stable to d/c.   Difficult to place patient No   Consultants:  Podiatry Vascular surgery  Procedures: Dr. Oneida Alar Procedure: Ultrasound right groin, abdominal aortogram with bilateral lower extremity runoff  Preoperative diagnosis: Gangrene left foot  Postoperative diagnosis: Same  Anesthesia: Local  Operative findings: 1.  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  80% stenosis distal right external iliac artery above existing femoral above-knee popliteal bypass runoff not well visualized secondary to sheath being occlusive in the right external iliac artery  Antimicrobials:    Subjective: -Feels well, denies any complaints, underwent angiogram yesterday, had hypoglycemia as well  Objective: Vitals:   04/29/21 0413 04/29/21 0500 04/29/21 0600 04/29/21 0729  BP:    (!) 140/48  Pulse: 64 64 62 (!) 103  Resp:    18  Temp:    99.9 F (37.7 C)  TempSrc:    Oral  SpO2:    99%  Weight:       No intake or output data  in the 24 hours ending 04/29/21 1047  Filed Weights   04/27/21 1500  Weight: 68.5 kg    Examination:    General exam: Pleasant elderly female, laying in bed, AAOx3, no distress CVS: S1-S2, regular rate rhythm Lungs: Clear bilaterally Abdomen: Soft, nontender, bowel sounds present Extremities:  Black discoloration of the  distal third toe, diminished dorsalis pedis pulses Skin: As above Psychiatry:  Mood & affect appropriate.     Data Reviewed:   CBC: Recent Labs  Lab 04/27/21 1228 04/28/21 0243 04/28/21 1601 04/29/21 0155  WBC 20.8* 14.4*  --  14.2*  NEUTROABS 17.4*  --   --   --   HGB 10.5* 9.4* 9.9* 9.3*  HCT 32.5* 28.2* 29.0* 28.1*  MCV 89.8 87.6  --  88.4  PLT 238 204  --  332   Basic Metabolic Panel: Recent Labs  Lab 04/27/21 1228 04/28/21 0243 04/28/21 1601 04/29/21 0155  NA 132* 135 138 133*  K 3.8 3.4* 3.0* 3.1*  CL 97* 103 105 104  CO2 22 23  --  20*  GLUCOSE 165* 102* 106* 95  BUN 37* 35* 25* 26*  CREATININE 1.41* 1.25* 1.20* 1.24*  CALCIUM 9.4 8.6*  --  8.4*   GFR: Estimated Creatinine Clearance: 35.4 mL/min (A) (by C-G formula based on SCr of 1.24 mg/dL (H)). Liver Function Tests: No results for input(s): AST, ALT, ALKPHOS, BILITOT, PROT, ALBUMIN in the last 168 hours. No results for input(s): LIPASE, AMYLASE in the last 168 hours. No results for input(s): AMMONIA in the last 168 hours. Coagulation Profile: Recent Labs  Lab 04/27/21 1228  INR 1.1   Cardiac Enzymes: No results for input(s): CKTOTAL, CKMB, CKMBINDEX, TROPONINI in the last 168 hours. BNP (last 3 results) No results for input(s): PROBNP in the last 8760 hours. HbA1C: Recent Labs    04/27/21 1811  HGBA1C 7.2*   CBG: Recent Labs  Lab 04/28/21 1318 04/28/21 1359 04/28/21 2023 04/29/21 0632 04/29/21 0728  GLUCAP 56* 125* 121* 64* 133*   Lipid Profile: Recent Labs    04/28/21 0243  CHOL 86  HDL 26*  LDLCALC 42  TRIG 89  CHOLHDL 3.3   Thyroid Function Tests: No results for input(s): TSH, T4TOTAL, FREET4, T3FREE, THYROIDAB in the last 72 hours. Anemia Panel: No results for input(s): VITAMINB12, FOLATE, FERRITIN, TIBC, IRON, RETICCTPCT in the last 72 hours. Urine analysis:    Component Value Date/Time   COLORURINE YELLOW 08/10/2020 [place] 08/10/2020 0952    LABSPEC 1.018 08/10/2020 0952   PHURINE 5.0 08/10/2020 0952   GLUCOSEU >=500 (A) 08/10/2020 0952   HGBUR NEGATIVE 08/10/2020 0952   BILIRUBINUR NEGATIVE 08/10/2020 0952   KETONESUR NEGATIVE 08/10/2020 0952   PROTEINUR NEGATIVE 08/10/2020 0952   NITRITE NEGATIVE 08/10/2020 0952   LEUKOCYTESUR SMALL (A) 08/10/2020 0952   Sepsis Labs: @LABRCNTIP (procalcitonin:4,lacticidven:4)  ) Recent Results (from the past 240 hour(s))  Blood culture (routine x 2)     Status: None (Preliminary result)   Collection Time: 04/27/21 12:14 PM   Specimen: BLOOD RIGHT HAND  Result Value Ref Range Status   Specimen Description BLOOD RIGHT HAND  Final   Special Requests   Final    BOTTLES DRAWN AEROBIC AND ANAEROBIC Blood Culture adequate volume   Culture   Final    NO GROWTH 2 DAYS Performed at [facility], [place] [address], [place], [place] [address]    Report Status PENDING  Incomplete  Blood culture (routine x 2)  Status: None (Preliminary result)   Collection Time: 04/27/21 12:28 PM   Specimen: BLOOD LEFT ARM  Result Value Ref Range Status   Specimen Description BLOOD LEFT ARM  Final   Special Requests   Final    BOTTLES DRAWN AEROBIC AND ANAEROBIC Blood Culture adequate volume   Culture   Final    NO GROWTH 2 DAYS Performed at [facility], [address], [place], [place] [address]    Report Status PENDING  Incomplete  Resp Panel by RT-PCR (Flu A&B, Covid) Nasopharyngeal Swab     Status: Abnormal   Collection Time: 04/27/21  4:40 PM   Specimen: Nasopharyngeal Swab; Nasopharyngeal(NP) swabs in vial transport medium  Result Value Ref Range Status   SARS Coronavirus 2 by RT PCR POSITIVE (A) NEGATIVE Final    Comment: RESULT CALLED TO, READ BACK BY AND VERIFIED WITHTish Men RN 1806 04/27/21 A BROWNING (NOTE) SARS-CoV-2 target nucleic acids are DETECTED.  The SARS-CoV-2 RNA is generally detectable in upper respiratory specimens during the acute phase of infection. Positive  results are indicative of the presence of the identified virus, but do not rule out bacterial infection or co-infection with other pathogens not detected by the test. Clinical correlation with patient history and other diagnostic information is necessary to determine patient infection status. The expected result is Negative.  Fact Sheet for Patients: EntrepreneurPulse.com.au  Fact Sheet for Healthcare Providers: IncredibleEmployment.be  This test is not yet approved or cleared by the [place] FDA and  has been authorized for detection and/or diagnosis of SARS-CoV-2 by FDA under an Emergency Use Authorization (EUA).  This EUA will remain in effect (meaning this test can  be used) for the duration of  the COVID-19 declaration under Section 564(b)(1) of the Act, 21 U.S.C. section 360bbb-3(b)(1), unless the authorization is terminated or revoked sooner.     Influenza A by PCR NEGATIVE NEGATIVE Final   Influenza B by PCR NEGATIVE NEGATIVE Final    Comment: (NOTE) The Xpert Xpress SARS-CoV-2/FLU/RSV plus assay is intended as an aid in the diagnosis of influenza from Nasopharyngeal swab specimens and should not be used as a sole basis for treatment. Nasal washings and aspirates are unacceptable for Xpert Xpress SARS-CoV-2/FLU/RSV testing.  Fact Sheet for Patients: EntrepreneurPulse.com.au  Fact Sheet for Healthcare Providers: IncredibleEmployment.be  This test is not yet approved or cleared by the [place] FDA and has been authorized for detection and/or diagnosis of SARS-CoV-2 by FDA under an Emergency Use Authorization (EUA). This EUA will remain in effect (meaning this test can be used) for the duration of the COVID-19 declaration under Section 564(b)(1) of the Act, 21 U.S.C. section 360bbb-3(b)(1), unless the authorization is terminated or revoked.  Performed at [facility], [place]  [address] [place], [place] [address]   Surgical PCR screen     Status: Abnormal   Collection Time: 04/27/21  9:31 PM   Specimen: Nasal Mucosa; Nasal Swab  Result Value Ref Range Status   MRSA, PCR NEGATIVE NEGATIVE Final   Staphylococcus aureus POSITIVE (A) NEGATIVE Final    Comment: (NOTE) The Xpert SA Assay (FDA approved for NASAL specimens in patients 48 years of age and older), is one component of a comprehensive surveillance program. It is not intended to diagnose infection nor to guide or monitor treatment. Performed at [facility], [place] [address] [place], [place] [address]          Radiology Studies: PERIPHERAL VASCULAR CATHETERIZATION  Result  Date: 04/28/2021 Procedure: Ultrasound right groin, abdominal aortogram with bilateral lower extremity runoff Preoperative diagnosis: Gangrene left foot Postoperative diagnosis: Same Anesthesia: Local Operative findings: 1.  Severe left superficial femoral artery occlusive disease with 3 cm occlusion of left popliteal artery two-vessel runoff left foot moderate to severe disease below-knee popliteal artery and tibial disease 2.  80% stenosis distal right external iliac artery above existing femoral above-knee popliteal bypass runoff not well visualized secondary to sheath being occlusive in the right external iliac artery Operative details: After obtaining informed consent, the patient was taken to the [place] lab.  The patient was placed in supine position on the angio table.  Both groins were prepped and draped in usual sterile fashion.  Ultrasound was used to identify the patient's right common femoral artery and a pre-existing right femoral-popliteal bypass.  Local anesthesia was infiltrated over the common femoral artery just proximal to the bypass.  Initially I tried to use a micropuncture needle to access this and I could not get the micropuncture wire advanced.  Direct pressure was held over the groin for about 5 minutes.  I then proceeded  to use a standard introducer needle and was able to cannulate the artery and advanced an 035 versa core wire up into the abdominal aorta.  A 5 French sheath was then placed over the guidewire after dilating the tract with a 6 French dilator due to previous scar tissue.  5 French pigtail catheter was advanced up into the abdominal aorta under fluoroscopic guidance.  Abdominal aortogram was then obtained in AP projection.  Left and right renal arteries are patent.  Infrarenal abdominal aorta is patent.  The left external common internal iliac arteries are patent.  The right common iliac artery is patent.  There is a subtotal occlusion of the right external iliac artery.  The right internal iliac artery is patent.  At this point since the sheath on the right side was intentionally occlusive we administered 7000 units of intravenous heparin.  I then obtained bilateral lower extremity runoff views through the pigtail catheter. In the left lower extremity the left common femoral profunda femoris and superficial femoral femoral arteries are patent.  The proximal superficial femoral artery on the left side has a stent extending about 7 cm from the origin of the SFA which is patent.  The remainder of the distal left superficial femoral artery and proximal popliteal artery is diffusely diseased with multiple segments of 50 to 90% stenosis.  The popliteal artery is occluded.  The below-knee popliteal artery then reconstitutes and gives off two-vessel runoff via the anterior tibial and posterior tibial artery to the left foot. In the right lower extremity the right common femoral has what appears to be an endarterectomy which is patent.  Initially the right leg did not fill any further down in the common femoral.  I then removed the pigtail catheter and using the sheath in the right groin obtained several films pulling the sheath back where it was just inside the vessel on the right side.  There is a 90% stenosis of the distal  external iliac artery.  The common femoral artery does not fill as well as the profunda.  There is very delayed filling of the left femoral to above-knee popliteal bypass.  There is no flow below the distal anastomosis of the femoral above-knee popliteal bypass. Currently there was not enough room due to the area of the puncture site just above the femoropopliteal to have enough room to stent the  external iliac artery.  I discussed the findings and showed the images to Dr. Stanford Breed who is on-call this weekend.  The patient's right leg was dusky and mottled in appearance at the conclusion of the procedure.  We decided to administer protamine to reverse her heparin and remove the sheath to see if the right leg perfusion improves.  Hopefully this is secondary to the sheath being occlusive rather than the femoropopliteal now being occluded.  If not she will need an emergent operation later today to thrombectomize her right femoropopliteal and a right femoral endarterectomy versus stent.  As far as the left leg is concerned she will most likely need a left femoral to below-knee popliteal bypass and a toe amputation.  All of this was discussed with the patient and Dr. Jill Side will follow up with her after the sheath has been removed. Ruta Hinds, MD [facility] Office: [phone]   DG Foot Complete Left  Result Date: 04/27/2021 CLINICAL DATA:  Wound along the left third toe. History of diabetes. 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 Irregularity of the adjacent soft tissues noted. There is gas tracking in the soft tissues of the proximal toe around the proximal metaphysis of the proximal phalanx of the third toe, indicating soft tissue infection. There is shortening of the proximal phalanx of the fourth toe with absence of the prior head of  the proximal phalanx related to the fractures shown on 09/04/2019. Old deformity of the fifth metatarsal compatible with old healed fracture. Accessory navicular. Plantar and Achilles calcaneal spurs. Atherosclerotic calcification. Dorsal midfoot spurring. IMPRESSION: 1. 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 Abnormal gas tracking in the soft tissues of the proximal third toe. 2. Old deformities of the proximal phalanx fourth toe and of the fifth metatarsal resulting from prior fractures. 3. Plantar and Achilles calcaneal spurs. 4. Atherosclerosis. Electronically Signed   By: Van Clines M.D.   On: 04/27/2021 13:17   VAS [place] LOWER EXTREMITY ARTERIAL DUPLEX  Result Date: 04/28/2021 LOWER EXTREMITY ARTERIAL DUPLEX STUDY Patient Name:  NIRVANA BLANCHETT  Date of Exam:   04/28/2021 Medical Rec #: [id number]      Accession #:    [id number] Date of Birth: Sep 28, 1940      Patient Gender: F Patient Age:   23 years Exam Location:  [facility] Procedure:      VAS [place] LOWER EXTREMITY ARTERIAL DUPLEX Referring Phys: Servando Snare --------------------------------------------------------------------------------  Indications: Gangrene. High Risk Factors: Hypertension, hyperlipidemia, Diabetes, past history of                    smoking, prior MI, coronary artery disease.  Current ABI: Not obtained Limitations: color and pulsed wave Doppler artifact Comparison Study: 06/09/2020 lower extremity arterial duplex Performing Technologist: Maudry Mayhew MHA, RDMS, RVT, RDCS  Examination Guidelines: A complete evaluation includes B-mode imaging, spectral Doppler, color Doppler, and power Doppler as needed of all accessible portions of each vessel. Bilateral testing is considered an integral part of a complete examination. Limited examinations for reoccurring indications may be performed as noted.   +-----------+--------+-----+---------------+------------------+--------+ LEFT       PSV cm/sRatioStenosis        Waveform          Comments +-----------+--------+-----+---------------+------------------+--------+ CFA Distal 151          30-49% stenosisbiphasic                   +-----------+--------+-----+---------------+------------------+--------+  DFA        320          50-74% stenosisbiphasic                   +-----------+--------+-----+---------------+------------------+--------+ SFA Prox   157          30-49% stenosismonophasic                 +-----------+--------+-----+---------------+------------------+--------+ SFA Mid    318          50-74% stenosismonophasic                 +-----------+--------+-----+---------------+------------------+--------+ SFA Distal 215          50-74% stenosismonophasic                 +-----------+--------+-----+---------------+------------------+--------+ POP Distal 52                          monophasic                 +-----------+--------+-----+---------------+------------------+--------+ TP Trunk   80                          monophasic                 +-----------+--------+-----+---------------+------------------+--------+ ATA Prox   61                          monophasic                 +-----------+--------+-----+---------------+------------------+--------+ ATA Distal 9                           Thumped monophasic         +-----------+--------+-----+---------------+------------------+--------+ PTA Prox   30                          monophasic                 +-----------+--------+-----+---------------+------------------+--------+ PTA Distal 27                          monophasic                 +-----------+--------+-----+---------------+------------------+--------+ PERO Distal             occluded                                  +-----------+--------+-----+---------------+------------------+--------+ DP         61                                                      +-----------+--------+-----+---------------+------------------+--------+  Summary: Left: 30-49% stenosis noted in the common femoral artery. 50-74% stenosis noted in the deep femoral artery. 50-74% stenosis noted in the superficial femoral artery. Total occlusion noted in the peroneal artery.  See table(s) above for measurements and observations. Electronically signed by Jamelle Haring on 04/28/2021 at 5:36:03 PM.    Final         Scheduled Meds:  ascorbic acid  250 mg Oral Q  supper   aspirin EC  81 mg Oral QHS   atorvastatin  80 mg Oral Daily   brimonidine  1 drop Right Eye BID   clopidogrel  75 mg Oral Daily   enoxaparin (LOVENOX) injection  40 mg Subcutaneous q1600   insulin aspart  0-15 Units Subcutaneous TID WC   metoprolol tartrate  37.5 mg Oral BID   mupirocin ointment  1 application Nasal BID   pantoprazole  40 mg Oral Once per day on Mon Wed Fri   pyridoxine  100 mg Oral QPM   saccharomyces boulardii  250 mg Oral BID   sodium chloride flush  3 mL Intravenous Q12H   sodium chloride flush  3 mL Intravenous Q12H   Continuous Infusions:  sodium chloride     piperacillin-tazobactam 3.375 g (04/29/21 0821)   vancomycin 750 mg (04/28/21 1856)     LOS: 2 days    Time spent: 33min  Domenic Polite, MD [facility]   04/29/2021, 10:47 AM

## 2021-04-29 NOTE — Progress Notes (Addendum)
  Progress Note    04/29/2021 8:22 AM 1 Day Post-Op  Subjective:  Denies R foot rest pain   Vitals:   04/29/21 0600 04/29/21 0729  BP:  (!) 140/48  Pulse: 62 (!) 103  Resp:  18  Temp:  99.9 F (37.7 C)  SpO2:  99%   Physical Exam: Lungs:  non labored Incisions:  R groin cath site without hematoma Extremities:  R ATA soft by doppler; brisk L ATA by doppler Neurologic: a&O  CBC    Component Value Date/Time   WBC 14.2 (H) 04/29/2021 0155   RBC 3.18 (L) 04/29/2021 0155   HGB 9.3 (L) 04/29/2021 0155   HGB 12.2 07/29/2020 0942   HCT 28.1 (L) 04/29/2021 0155   HCT 37.4 07/29/2020 0942   PLT 215 04/29/2021 0155   PLT 304 07/29/2020 0942   MCV 88.4 04/29/2021 0155   MCV 84 07/29/2020 0942   MCH 29.2 04/29/2021 0155   MCHC 33.1 04/29/2021 0155   RDW 13.8 04/29/2021 0155   RDW 14.8 07/29/2020 0942   LYMPHSABS 1.2 04/27/2021 1228   MONOABS 1.9 (H) 04/27/2021 1228   EOSABS 0.0 04/27/2021 1228   BASOSABS 0.1 04/27/2021 1228    BMET    Component Value Date/Time   NA 133 (L) 04/29/2021 0155   NA 142 07/29/2020 0942   K 3.1 (L) 04/29/2021 0155   CL 104 04/29/2021 0155   CO2 20 (L) 04/29/2021 0155   GLUCOSE 95 04/29/2021 0155   BUN 26 (H) 04/29/2021 0155   BUN 17 07/29/2020 0942   CREATININE 1.24 (H) 04/29/2021 0155   CREATININE 0.86 12/09/2019 1136   CALCIUM 8.4 (L) 04/29/2021 0155   GFRNONAA 44 (L) 04/29/2021 0155   GFRAA 75 07/29/2020 0942    INR    Component Value Date/Time   INR 1.1 04/27/2021 1228    No intake or output data in the 24 hours ending 04/29/21 3154   Assessment/Plan:  80 y.o. female is s/p Aortogram 1 Day Post-Op   -There was concern for RLE ischemia following angiography; feet however are now symmetrically warm and she has a dopplerable R ATA signal -Based on angiogram findings, patient will be considered for L femoral to BK popliteal bypass with toe amputation -Check vein mapping of LLE   Dagoberto Ligas, PA-C [facility] [phone] 04/29/2021 8:22 AM  VASCULAR STAFF ADDENDUM: I have independently interviewed and examined the patient. I agree with the above.   Angiogram reviewed in detail with Dr. Oneida Alar yesterday. Her right leg improved completely once sheath was removed. She has two problems which will need to be addressed: LLE CLTI and RLE inflow stenosis.   Because the left leg is limb threatening, we will address this first. Later, we can do a R femoral and proximal anastomotic endarterectomy / patch angioplasty. Plan L CFA - TP trunk / AT origin bypass for limb salvage Wednesday 05/03/21.  Check vein mapping today. Patient does not need to remain in house after vein mapping is complete. We will follow along while in house.  [name]. Stanford Breed, MD [facility] Phone Number: [phone] 04/29/2021 9:32 AM

## 2021-04-29 NOTE — Progress Notes (Signed)
VASCULAR LAB    Upper extremity vein mapping has been performed.  See CV proc for preliminary results.   Madigan Rosensteel, RVT 04/29/2021, 6:33 PM

## 2021-04-30 DIAGNOSIS — M86172 Other acute osteomyelitis, left ankle and foot: Secondary | ICD-10-CM | POA: Diagnosis not present

## 2021-04-30 LAB — GLUCOSE, CAPILLARY
Glucose-Capillary: 106 mg/dL — ABNORMAL HIGH (ref 70–99)
Glucose-Capillary: 101 mg/dL — ABNORMAL HIGH (ref 70–99)
Glucose-Capillary: 264 mg/dL — ABNORMAL HIGH (ref 70–99)
Glucose-Capillary: 146 mg/dL — ABNORMAL HIGH (ref 70–99)
Glucose-Capillary: 160 mg/dL — ABNORMAL HIGH (ref 70–99)

## 2021-04-30 LAB — VANCOMYCIN, TROUGH: Vancomycin Tr: 14 ug/mL — ABNORMAL LOW (ref 15–20)

## 2021-04-30 LAB — VANCOMYCIN, PEAK: Vancomycin Pk: 33 ug/mL (ref 30–40)

## 2021-04-30 NOTE — Progress Notes (Addendum)
  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; R ATA and PT doppler signal; brisk L ATA signal Neurologic: A&O  CBC    Component Value Date/Time   WBC 14.2 (H) 04/29/2021 0155   RBC 3.18 (L) 04/29/2021 0155   HGB 9.3 (L) 04/29/2021 0155   HGB 12.2 07/29/2020 0942   HCT 28.1 (L) 04/29/2021 0155   HCT 37.4 07/29/2020 0942   PLT 215 04/29/2021 0155   PLT 304 07/29/2020 0942   MCV 88.4 04/29/2021 0155   MCV 84 07/29/2020 0942   MCH 29.2 04/29/2021 0155   MCHC 33.1 04/29/2021 0155   RDW 13.8 04/29/2021 0155   RDW 14.8 07/29/2020 0942   LYMPHSABS 1.2 04/27/2021 1228   MONOABS 1.9 (H) 04/27/2021 1228   EOSABS 0.0 04/27/2021 1228   BASOSABS 0.1 04/27/2021 1228    BMET    Component Value Date/Time   NA 133 (L) 04/29/2021 0155   NA 142 07/29/2020 0942   K 3.1 (L) 04/29/2021 0155   CL 104 04/29/2021 0155   CO2 20 (L) 04/29/2021 0155   GLUCOSE 95 04/29/2021 0155   BUN 26 (H) 04/29/2021 0155   BUN 17 07/29/2020 0942   CREATININE 1.24 (H) 04/29/2021 0155   CREATININE 0.86 12/09/2019 1136   CALCIUM 8.4 (L) 04/29/2021 0155   GFRNONAA 44 (L) 04/29/2021 0155   GFRAA 75 07/29/2020 0942    INR    Component Value Date/Time   INR 1.1 04/27/2021 1228     Intake/Output Summary (Last 24 hours) at 04/30/2021 0936 Last data filed at 04/30/2021 0800 Gross per 24 hour  Intake 362.61 ml  Output 400 ml  Net -37.39 ml     Assessment/Plan:  80 y.o. female is s/p diagnostic angio 2 Days Post-Op   Plan is for L femoral to popliteal bypass with 3rd toe amp on Wednesday 8/10 Once patient is recovered from bypass surgery; RLE bypass inflow will be addressed. Ok to discharge home prior to  surgery Wednesday   Dagoberto Ligas, PA-C [facility] [phone] 04/30/2021 9:36 AM  VASCULAR STAFF ADDENDUM: I have independently interviewed and examined the patient. I agree with the above.  Plan left femoral - popliteal bypass with right cephalic vein in OR Wednesday.  [name]. Stanford Breed, MD [facility] Phone Number: [phone] 04/30/2021 10:10 AM

## 2021-04-30 NOTE — Progress Notes (Signed)
PROGRESS NOTE    [name]  LZJ:[id number] DOB: 1940-10-02 DOA: 04/27/2021 PCP: [name], MD  Brief Narrative:[name] is a 80 y.o. female with medical history significant of hypertension, hyperlipidemia, CAD s/p CABG in 2021, PVD s/p bypass grafting, diabetes mellitus type II, ray amputation of right foot presents with left third toe wound.  She had initially scraped her third toe on a barstool 3 days ago.  Subsequently developed redness pain and streaking going up her left leg associated with fevers and chills . She is s/p right common femoral endarterectomy with femoral popliteal bypass graft on 08/16/2020.  And previously had angioplasty with stent continue to have the left SFA. In the ED she was febrile, mildly tachycardic, WBC was 20 point 8K, x-ray of the left foot noted osteomyelitis of distal phalanx third toe     Assessment & Plan:   Left great toe gangrene Osteomyelitis, cellulitis Sepsis, poa -Remains on IV vancomycin and Zosyn day 3 -Blood cultures are negative -VVS and podiatry following,  -Underwent angiogram 8/5 noted to have severe left superficial femoral artery occlusive disease with 3 cm occlusion of left popliteal artery, 80% stenosis of distal right external iliac artery above existing femoropopliteal bypass  -Plan for left femoral to below-knee popliteal bypass and toe amputation on wednesday -hold Plavix  Acute kidney injury -Prerenal, resolving, off IV fluids now -Creatinine down to 1.2 today  SARS COVID-19 infection -Asymptomatic, monitor -Airborne isolation X 10 days  Essential hypertension -Continue metoprolol, holding HCTZ and Micardis  Type 2 diabetes mellitus -Hemoglobin A1c was 7.7 in April -Hypoglycemic yesterday, now on a diet -Oral hypoglycemics on hold, continue sliding scale insulin for now  Chronic anemia -Monitor  CAD: s/p CABG in 03/2020 -Continue statin, aspirin,  -Hold Plavix for surgery  Peripheral vascular  disease: Patient is status post right foot ray amputation back in 07/2019. -Continue statin, and aspirin -Hold Plavix  DVT prophylaxis: Lovenox Code Status: Full code Family Communication: Discussed patient in detail, no family at bedside Disposition Plan:  Status is: Inpatient  Remains inpatient appropriate because:Inpatient level of care appropriate due to severity of illness  Dispo: The patient is from: Home              Anticipated d/c is to: Home              Patient currently is not medically stable to d/c.   Difficult to place patient No   Consultants:  Podiatry Vascular surgery  Procedures: Dr. Oneida Alar Procedure: Ultrasound right groin, abdominal aortogram with bilateral lower extremity runoff  Preoperative diagnosis: Gangrene left foot  Postoperative diagnosis: Same  Anesthesia: Local  Operative findings: 1.  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  80% stenosis distal right external iliac artery above existing femoral above-knee popliteal bypass runoff not well visualized secondary to sheath being occlusive in the right external iliac artery  Antimicrobials:    Subjective: -Feels okay, denies any complaints, given severity of infection going up her leg, does not feel comfortable going home on oral antibiotics for 2 days before surgery  Objective: Vitals:   04/29/21 2051 04/30/21 0016 04/30/21 0508 04/30/21 0846  BP: (!) 134/43 (!) 143/51 (!) 132/42 (!) 153/56  Pulse: 100 85  95  Resp:  17 12 16   Temp:  99.3 F (37.4 C) 98.3 F (36.8 C) 98.5  F (36.9 C)  TempSrc:  Oral Oral Oral  SpO2:  99% 99% 97%  Weight:        Intake/Output Summary (Last 24 hours) at 04/30/2021 1051 Last data filed at 04/30/2021 0800 Gross per 24 hour  Intake 362.61 ml  Output 400 ml  Net -37.39 ml    Filed  Weights   04/27/21 1500  Weight: 68.5 kg    Examination:    General exam: Pleasant elderly female laying in bed, AAOx3, no distress CVS: S1-S2, regular rate rhythm Lungs: Clear bilaterally Abdomen: Soft, nontender, bowel sounds present Extremities:  Black discoloration of the distal third toe, diminished dorsalis pedis pulses Skin: As above Psychiatry:  Mood & affect appropriate.     Data Reviewed:   CBC: Recent Labs  Lab 04/27/21 1228 04/28/21 0243 04/28/21 1601 04/29/21 0155  WBC 20.8* 14.4*  --  14.2*  NEUTROABS 17.4*  --   --   --   HGB 10.5* 9.4* 9.9* 9.3*  HCT 32.5* 28.2* 29.0* 28.1*  MCV 89.8 87.6  --  88.4  PLT 238 204  --  671   Basic Metabolic Panel: Recent Labs  Lab 04/27/21 1228 04/28/21 0243 04/28/21 1601 04/29/21 0155  NA 132* 135 138 133*  K 3.8 3.4* 3.0* 3.1*  CL 97* 103 105 104  CO2 22 23  --  20*  GLUCOSE 165* 102* 106* 95  BUN 37* 35* 25* 26*  CREATININE 1.41* 1.25* 1.20* 1.24*  CALCIUM 9.4 8.6*  --  8.4*   GFR: Estimated Creatinine Clearance: 35.4 mL/min (A) (by C-G formula based on SCr of 1.24 mg/dL (H)). Liver Function Tests: No results for input(s): AST, ALT, ALKPHOS, BILITOT, PROT, ALBUMIN in the last 168 hours. No results for input(s): LIPASE, AMYLASE in the last 168 hours. No results for input(s): AMMONIA in the last 168 hours. Coagulation Profile: Recent Labs  Lab 04/27/21 1228  INR 1.1   Cardiac Enzymes: No results for input(s): CKTOTAL, CKMB, CKMBINDEX, TROPONINI in the last 168 hours. BNP (last 3 results) No results for input(s): PROBNP in the last 8760 hours. HbA1C: Recent Labs    04/27/21 1811  HGBA1C 7.2*   CBG: Recent Labs  Lab 04/29/21 1151 04/29/21 1628 04/29/21 2113 04/30/21 0632 04/30/21 0823  GLUCAP 162* 146* 200* 106* 101*   Lipid Profile: Recent Labs    04/28/21 0243  CHOL 86  HDL 26*  LDLCALC 42  TRIG 89  CHOLHDL 3.3   Thyroid Function Tests: No results for input(s): TSH, T4TOTAL,  FREET4, T3FREE, THYROIDAB in the last 72 hours. Anemia Panel: No results for input(s): VITAMINB12, FOLATE, FERRITIN, TIBC, IRON, RETICCTPCT in the last 72 hours. Urine analysis:    Component Value Date/Time   COLORURINE YELLOW 08/10/2020 0952   APPEARANCEUR CLEAR 08/10/2020 0952   LABSPEC 1.018 08/10/2020 0952   PHURINE 5.0 08/10/2020 0952   GLUCOSEU >=500 (A) 08/10/2020 0952   HGBUR NEGATIVE 08/10/2020 0952   BILIRUBINUR NEGATIVE 08/10/2020 0952   KETONESUR NEGATIVE 08/10/2020 0952   PROTEINUR NEGATIVE 08/10/2020 0952   NITRITE NEGATIVE 08/10/2020 0952   LEUKOCYTESUR SMALL (A) 08/10/2020 0952   Sepsis Labs: @LABRCNTIP (procalcitonin:4,lacticidven:4)  ) Recent Results (from the past 240 hour(s))  Blood culture (routine x 2)     Status: None (Preliminary result)   Collection Time: 04/27/21 12:14 PM   Specimen: BLOOD RIGHT HAND  Result Value Ref Range Status   Specimen Description BLOOD RIGHT HAND  Final   Special Requests   Final  BOTTLES DRAWN AEROBIC AND ANAEROBIC Blood Culture adequate volume   Culture   Final    NO GROWTH 3 DAYS Performed at [facility], [place] [address] [place], [place] [address]    Report Status PENDING  Incomplete  Blood culture (routine x 2)     Status: None (Preliminary result)   Collection Time: 04/27/21 12:28 PM   Specimen: BLOOD LEFT ARM  Result Value Ref Range Status   Specimen Description BLOOD LEFT ARM  Final   Special Requests   Final    BOTTLES DRAWN AEROBIC AND ANAEROBIC Blood Culture adequate volume   Culture   Final    NO GROWTH 3 DAYS Performed at [facility], [place] [address] [place], [place] [address]    Report Status PENDING  Incomplete  Resp Panel by RT-PCR (Flu A&B, Covid) Nasopharyngeal Swab     Status: Abnormal   Collection Time: 04/27/21  4:40 PM   Specimen: Nasopharyngeal Swab; Nasopharyngeal(NP) swabs in vial transport medium  Result Value Ref Range Status   SARS Coronavirus 2 by RT PCR POSITIVE (A)  NEGATIVE Final    Comment: RESULT CALLED TO, READ BACK BY AND VERIFIED WITHTish Men RN 1806 04/27/21 A BROWNING (NOTE) SARS-CoV-2 target nucleic acids are DETECTED.  The SARS-CoV-2 RNA is generally detectable in upper respiratory specimens during the acute phase of infection. Positive results are indicative of the presence of the identified virus, but do not rule out bacterial infection or co-infection with other pathogens not detected by the test. Clinical correlation with patient history and other diagnostic information is necessary to determine patient infection status. The expected result is Negative.  Fact Sheet for Patients: EntrepreneurPulse.com.au  Fact Sheet for Healthcare Providers: IncredibleEmployment.be  This test is not yet approved or cleared by the [place] FDA and  has been authorized for detection and/or diagnosis of SARS-CoV-2 by FDA under an Emergency Use Authorization (EUA).  This EUA will remain in effect (meaning this test can  be used) for the duration of  the COVID-19 declaration under Section 564(b)(1) of the Act, 21 U.S.C. section 360bbb-3(b)(1), unless the authorization is terminated or revoked sooner.     Influenza A by PCR NEGATIVE NEGATIVE Final   Influenza B by PCR NEGATIVE NEGATIVE Final    Comment: (NOTE) The Xpert Xpress SARS-CoV-2/FLU/RSV plus assay is intended as an aid in the diagnosis of influenza from Nasopharyngeal swab specimens and should not be used as a sole basis for treatment. Nasal washings and aspirates are unacceptable for Xpert Xpress SARS-CoV-2/FLU/RSV testing.  Fact Sheet for Patients: EntrepreneurPulse.com.au  Fact Sheet for Healthcare Providers: IncredibleEmployment.be  This test is not yet approved or cleared by the [place] FDA and has been authorized for detection and/or diagnosis of SARS-CoV-2 by FDA under an Emergency Use  Authorization (EUA). This EUA will remain in effect (meaning this test can be used) for the duration of the COVID-19 declaration under Section 564(b)(1) of the Act, 21 U.S.C. section 360bbb-3(b)(1), unless the authorization is terminated or revoked.  Performed at [facility], [place] [address], [place], [place] [address]   Surgical PCR screen     Status: Abnormal   Collection Time: 04/27/21  9:31 PM   Specimen: Nasal Mucosa; Nasal Swab  Result Value Ref Range Status   MRSA, PCR NEGATIVE NEGATIVE Final   Staphylococcus aureus POSITIVE (A) NEGATIVE Final    Comment: (NOTE) The Xpert SA Assay (FDA approved for NASAL specimens in patients 22 years of  age and older), is one component of a comprehensive surveillance program. It is not intended to diagnose infection nor to guide or monitor treatment. Performed at [facility], [place] [address], [place], [place] [address]          Radiology Studies: PERIPHERAL VASCULAR CATHETERIZATION  Result Date: 04/28/2021 Procedure: Ultrasound right groin, abdominal aortogram with bilateral lower extremity runoff Preoperative diagnosis: Gangrene left foot Postoperative diagnosis: Same Anesthesia: Local Operative findings: 1.  Severe left superficial femoral artery occlusive disease with 3 cm occlusion of left popliteal artery two-vessel runoff left foot moderate to severe disease below-knee popliteal artery and tibial disease 2.  80% stenosis distal right external iliac artery above existing femoral above-knee popliteal bypass runoff not well visualized secondary to sheath being occlusive in the right external iliac artery Operative details: After obtaining informed consent, the patient was taken to the [facility] lab.  The patient was placed in supine position on the angio table.  Both groins were prepped and draped in usual sterile fashion.  Ultrasound was used to identify the patient's right common femoral artery and a pre-existing right  femoral-popliteal bypass.  Local anesthesia was infiltrated over the common femoral artery just proximal to the bypass.  Initially I tried to use a micropuncture needle to access this and I could not get the micropuncture wire advanced.  Direct pressure was held over the groin for about 5 minutes.  I then proceeded to use a standard introducer needle and was able to cannulate the artery and advanced an 035 versa core wire up into the abdominal aorta.  A 5 French sheath was then placed over the guidewire after dilating the tract with a 6 French dilator due to previous scar tissue.  5 French pigtail catheter was advanced up into the abdominal aorta under fluoroscopic guidance.  Abdominal aortogram was then obtained in AP projection.  Left and right renal arteries are patent.  Infrarenal abdominal aorta is patent.  The left external common internal iliac arteries are patent.  The right common iliac artery is patent.  There is a subtotal occlusion of the right external iliac artery.  The right internal iliac artery is patent.  At this point since the sheath on the right side was intentionally occlusive we administered 7000 units of intravenous heparin.  I then obtained bilateral lower extremity runoff views through the pigtail catheter. In the left lower extremity the left common femoral profunda femoris and superficial femoral femoral arteries are patent.  The proximal superficial femoral artery on the left side has a stent extending about 7 cm from the origin of the SFA which is patent.  The remainder of the distal left superficial femoral artery and proximal popliteal artery is diffusely diseased with multiple segments of 50 to 90% stenosis.  The popliteal artery is occluded.  The below-knee popliteal artery then reconstitutes and gives off two-vessel runoff via the anterior tibial and posterior tibial artery to the left foot. In the right lower extremity the right common femoral has what appears to be an  endarterectomy which is patent.  Initially the right leg did not fill any further down in the common femoral.  I then removed the pigtail catheter and using the sheath in the right groin obtained several films pulling the sheath back where it was just inside the vessel on the right side.  There is a 90% stenosis of the distal external iliac artery.  The common femoral artery does not fill as well as the profunda.  There is very delayed filling of the left femoral to above-knee popliteal bypass.  There is no flow below the distal anastomosis of the femoral above-knee popliteal bypass. Currently there was not enough room due to the area of the puncture site just above the femoropopliteal to have enough room to stent the external iliac artery.  I discussed the findings and showed the images to Dr. Stanford Breed who is on-call this weekend.  The patient's right leg was dusky and mottled in appearance at the conclusion of the procedure.  We decided to administer protamine to reverse her heparin and remove the sheath to see if the right leg perfusion improves.  Hopefully this is secondary to the sheath being occlusive rather than the femoropopliteal now being occluded.  If not she will need an emergent operation later today to thrombectomize her right femoropopliteal and a right femoral endarterectomy versus stent.  As far as the left leg is concerned she will most likely need a left femoral to below-knee popliteal bypass and a toe amputation.  All of this was discussed with the patient and Dr. Jill Side will follow up with her after the sheath has been removed. Ruta Hinds, MD [facility] Office: [phone]   VAS [place] LOWER EXTREMITY ARTERIAL DUPLEX  Result Date: 04/28/2021 LOWER EXTREMITY ARTERIAL DUPLEX STUDY Patient Name:  ENRICA CORLISS  Date of Exam:   04/28/2021 Medical Rec #: [id number]      Accession #:    [id number] Date of Birth: 07/17/1941      Patient Gender: F Patient Age:   69 years  Exam Location:  [place] [facility] Procedure:      VAS [place] LOWER EXTREMITY ARTERIAL DUPLEX Referring Phys: Servando Snare --------------------------------------------------------------------------------  Indications: Gangrene. High Risk Factors: Hypertension, hyperlipidemia, Diabetes, past history of                    smoking, prior MI, coronary artery disease.  Current ABI: Not obtained Limitations: color and pulsed wave Doppler artifact Comparison Study: 06/09/2020 lower extremity arterial duplex Performing Technologist: Maudry Mayhew MHA, RDMS, RVT, RDCS  Examination Guidelines: A complete evaluation includes B-mode imaging, spectral Doppler, color Doppler, and power Doppler as needed of all accessible portions of each vessel. Bilateral testing is considered an integral part of a complete examination. Limited examinations for reoccurring indications may be performed as noted.   +-----------+--------+-----+---------------+------------------+--------+ LEFT       PSV cm/sRatioStenosis       Waveform          Comments +-----------+--------+-----+---------------+------------------+--------+ CFA Distal 151          30-49% stenosisbiphasic                   +-----------+--------+-----+---------------+------------------+--------+ DFA        320          50-74% stenosisbiphasic                   +-----------+--------+-----+---------------+------------------+--------+ SFA Prox   157          30-49% stenosismonophasic                 +-----------+--------+-----+---------------+------------------+--------+ SFA Mid    318          50-74% stenosismonophasic                 +-----------+--------+-----+---------------+------------------+--------+ SFA Distal 215          50-74% stenosismonophasic                 +-----------+--------+-----+---------------+------------------+--------+  POP Distal 52                          monophasic                  +-----------+--------+-----+---------------+------------------+--------+ TP Trunk   80                          monophasic                 +-----------+--------+-----+---------------+------------------+--------+ ATA Prox   61                          monophasic                 +-----------+--------+-----+---------------+------------------+--------+ ATA Distal 9                           Thumped monophasic         +-----------+--------+-----+---------------+------------------+--------+ PTA Prox   30                          monophasic                 +-----------+--------+-----+---------------+------------------+--------+ PTA Distal 27                          monophasic                 +-----------+--------+-----+---------------+------------------+--------+ PERO Distal             occluded                                  +-----------+--------+-----+---------------+------------------+--------+ DP         61                                                     +-----------+--------+-----+---------------+------------------+--------+  Summary: Left: 30-49% stenosis noted in the common femoral artery. 50-74% stenosis noted in the deep femoral artery. 50-74% stenosis noted in the superficial femoral artery. Total occlusion noted in the peroneal artery.  See table(s) above for measurements and observations. Electronically signed by Jamelle Haring on 04/28/2021 at 5:36:03 PM.    Final    VAS [place] UPPER EXT VEIN MAPPING (PRE-OP AVF)  Result Date: 04/29/2021 UPPER EXTREMITY VEIN MAPPING Patient Name:  YOULANDA TOMASSETTI  Date of Exam:   04/29/2021 Medical Rec #: [id number]      Accession #:    [id number] Date of Birth: [birth year]      Patient Gender: F Patient Age:   31 years Exam Location:  [facility] Procedure:      VAS [place] UPPER EXT VEIN MAPPING (PRE-OP AVF) Referring Phys: Jamelle Haring --------------------------------------------------------------------------------   Indications: Pre-access. History: PAD.  Comparison Study: No prior upper extremity vein mapping. Prior lower extremity                   vein mapping done 11/21 Performing Technologist: Sharion Dove RVS  Examination Guidelines: A complete evaluation includes B-mode imaging, spectral Doppler, color Doppler, and power Doppler as needed of all  accessible portions of each vessel. Bilateral testing is considered an integral part of a complete examination. Limited examinations for reoccurring indications may be performed as noted. +-----------------+-------------+----------+---------+ Right Cephalic   Diameter (cm)Depth (cm)Findings  +-----------------+-------------+----------+---------+ Prox upper arm       0.30        0.79             +-----------------+-------------+----------+---------+ Mid upper arm        0.32        0.88   branching +-----------------+-------------+----------+---------+ Dist upper arm       0.30        0.70             +-----------------+-------------+----------+---------+ Antecubital fossa    0.38        0.58             +-----------------+-------------+----------+---------+ Prox forearm         0.24        0.17   branching +-----------------+-------------+----------+---------+ Mid forearm          0.29        0.52   branching +-----------------+-------------+----------+---------+ Dist forearm         0.29        0.36   branching +-----------------+-------------+----------+---------+ Wrist                0.29        0.35   thrombus  +-----------------+-------------+----------+---------+ +-----------------+-------------+----------+---------+ Left Cephalic    Diameter (cm)Depth (cm)Findings  +-----------------+-------------+----------+---------+ Prox upper arm       0.28        0.13             +-----------------+-------------+----------+---------+ Mid upper arm        0.33        0.93              +-----------------+-------------+----------+---------+ Dist upper arm       0.37        0.91             +-----------------+-------------+----------+---------+ Antecubital fossa    0.45        0.23             +-----------------+-------------+----------+---------+ Prox forearm         0.28        0.66   branching +-----------------+-------------+----------+---------+ Mid forearm          0.20        0.44             +-----------------+-------------+----------+---------+ Dist forearm         0.16        0.24             +-----------------+-------------+----------+---------+ Wrist                0.16        0.25             +-----------------+-------------+----------+---------+ +-----------------+-------------+----------+--------------+ Left Basilic     Diameter (cm)Depth (cm)   Findings    +-----------------+-------------+----------+--------------+ Prox upper arm       0.30        0.68                  +-----------------+-------------+----------+--------------+ Mid upper arm        0.33        0.70                  +-----------------+-------------+----------+--------------+ Dist upper arm  0.29        0.80                  +-----------------+-------------+----------+--------------+ Antecubital fossa    0.41        0.23                  +-----------------+-------------+----------+--------------+ Prox forearm         0.25        0.60     branching    +-----------------+-------------+----------+--------------+ Mid forearm          0.20        0.34                  +-----------------+-------------+----------+--------------+ Distal forearm       0.19        0.30                  +-----------------+-------------+----------+--------------+ Wrist                                   not visualized +-----------------+-------------+----------+--------------+ *See table(s) above for measurements and observations.  Diagnosing physician:    Preliminary          Scheduled Meds:  ascorbic acid  250 mg Oral Q supper   aspirin EC  81 mg Oral QHS   atorvastatin  80 mg Oral Daily   brimonidine  1 drop Right Eye BID   enoxaparin (LOVENOX) injection  40 mg Subcutaneous q1600   insulin aspart  0-15 Units Subcutaneous TID WC   metoprolol tartrate  37.5 mg Oral BID   mupirocin ointment  1 application Nasal BID   pantoprazole  40 mg Oral Once per day on Mon Wed Fri   pyridoxine  100 mg Oral QPM   saccharomyces boulardii  250 mg Oral BID   sodium chloride flush  3 mL Intravenous Q12H   sodium chloride flush  3 mL Intravenous Q12H   Continuous Infusions:  sodium chloride     piperacillin-tazobactam 3.375 g (04/30/21 0858)   vancomycin 750 mg (04/29/21 2029)     LOS: 3 days    Time spent: 72min  Domenic Polite, MD [facility]   04/30/2021, 10:51 AM

## 2021-04-30 NOTE — Progress Notes (Signed)
Pharmacy Antibiotic Note  [name] is a 80 y.o. female admitted on 04/27/2021 with  osteomyelitis .  Pharmacy has been consulted for vancomycin dosing; also on Zosyn. Patient stubbed her toe on furniture on 8/1 and was seen by her PCP 8/4 who sent her here as her toe is necrotic.   Planning for left femoral to below-knee popliteal bypass and toe amputation on Wednesday. WBC 14.2. Vanc levels were drawn today and are as follows:  Regimen: 750 mg IV Q 24 hours Vanc peak 33 Vanc trough 14 Calculated AUC 511   Plan: Continue Vancomycin 750 mg IV q24h Goal AUC 400-550. Continue Zosyn 3.375 gm IV Q 8 hours  Monitor renal function, clinical status, and abx plan.  Weight: 68.5 kg (151 lb 0.2 oz)  Temp (24hrs), Avg:99.4 F (37.4 C), Min:98.3 F (36.8 C), Max:101.3 F (38.5 C)  Recent Labs  Lab 04/27/21 1228 04/27/21 1811 04/28/21 0243 04/28/21 1601 04/29/21 0155 04/30/21 1742 04/30/21 2141  WBC 20.8*  --  14.4*  --  14.2*  --   --   CREATININE 1.41*  --  1.25* 1.20* 1.24*  --   --   LATICACIDVEN 1.8 1.7  --   --   --   --   --   VANCOTROUGH  --   --   --   --   --  14*  --   VANCOPEAK  --   --   --   --   --   --  33     Estimated Creatinine Clearance: 35.4 mL/min (A) (by C-G formula based on SCr of 1.24 mg/dL (H)).    Allergies  Allergen Reactions   Bactrim [Sulfamethoxazole-Trimethoprim] Other (See Comments)    ^ K+( elevated)    Demerol [Meperidine]     Delusional    Scopolamine     Delusional    Tramadol Other (See Comments)    Keeps awake   Versed [Midazolam] Anxiety    Frantic, out of my mind, agitated     Antimicrobials this admission: Zosyn 8/4 >> Vancomycin 8/4 >>  Dose adjustments this admission:   Microbiology results: 8/4 BCx: ngtd   Thank you for involving pharmacy in this patient's care.  Albertina Parr, PharmD., BCPS, BCCCP Clinical Pharmacist Please refer to [facility] for unit-specific pharmacist

## 2021-04-30 NOTE — Progress Notes (Signed)
  Subjective:  Patient ID: [name], female    DOB: 10/28/1940,  MRN: [id number]  Patient seen bedside. Denies complaints. Has vascular procedure planned for Wednesday Objective:   Vitals:   04/30/21 0846 04/30/21 1245  BP: (!) 153/56 (!) 119/44  Pulse: 95 79  Resp: 16 16  Temp: 98.5 F (36.9 C) 98.7 F (37.1 C)  SpO2: 97% 98%   General AA&O x3. Normal mood and affect.  Vascular Foot warm, faint DP pulse. Non-palp PT  Neurologic Epicritic sensation grossly intact.  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 Ascending cellulitis resolving.  Orthopedic: +Motor to toes   Assessment & Plan:  Patient was evaluated and treated and all questions answered.  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 -Appreciate vascular help with increasing inflow. Will defer to vascular for toe amputation if able to be performed Wednesday. Otherwise will be happy to plan for amputation separately.  -Will continue to follow peripherally. -Wound care: betadine and gauze to toe daily. Order placed.  Evelina Bucy, DPM  Accessible via secure chat for questions or concerns.

## 2021-05-01 ENCOUNTER — Encounter (HOSPITAL_COMMUNITY): Payer: Self-pay | Admitting: Vascular Surgery

## 2021-05-01 DIAGNOSIS — M86172 Other acute osteomyelitis, left ankle and foot: Secondary | ICD-10-CM | POA: Diagnosis not present

## 2021-05-01 LAB — GLUCOSE, CAPILLARY
Glucose-Capillary: 103 mg/dL — ABNORMAL HIGH (ref 70–99)
Glucose-Capillary: 241 mg/dL — ABNORMAL HIGH (ref 70–99)
Glucose-Capillary: 186 mg/dL — ABNORMAL HIGH (ref 70–99)
Glucose-Capillary: 194 mg/dL — ABNORMAL HIGH (ref 70–99)

## 2021-05-01 LAB — CBC
HCT: 26.2 % — ABNORMAL LOW (ref 36.0–46.0)
Hemoglobin: 8.6 g/dL — ABNORMAL LOW (ref 12.0–15.0)
MCH: 29.3 pg (ref 26.0–34.0)
MCHC: 32.8 g/dL (ref 30.0–36.0)
MCV: 89.1 fL (ref 80.0–100.0)
Platelets: 281 10*3/uL (ref 150–400)
RBC: 2.94 MIL/uL — ABNORMAL LOW (ref 3.87–5.11)
RDW: 14 % (ref 11.5–15.5)
WBC: 17 10*3/uL — ABNORMAL HIGH (ref 4.0–10.5)
nRBC: 0 % (ref 0.0–0.2)

## 2021-05-01 LAB — MAGNESIUM: Magnesium: 2 mg/dL (ref 1.7–2.4)

## 2021-05-01 LAB — BASIC METABOLIC PANEL
Anion gap: 11 (ref 5–15)
BUN: 23 mg/dL (ref 8–23)
CO2: 19 mmol/L — ABNORMAL LOW (ref 22–32)
Calcium: 8.2 mg/dL — ABNORMAL LOW (ref 8.9–10.3)
Chloride: 106 mmol/L (ref 98–111)
Creatinine, Ser: 1.43 mg/dL — ABNORMAL HIGH (ref 0.44–1.00)
GFR, Estimated: 37 mL/min — ABNORMAL LOW (ref >60)
Glucose, Bld: 156 mg/dL — ABNORMAL HIGH (ref 70–99)
Potassium: 3.3 mmol/L — ABNORMAL LOW (ref 3.5–5.1)
Sodium: 136 mmol/L (ref 135–145)

## 2021-05-01 MED ORDER — POTASSIUM CHLORIDE CRYS ER 20 MEQ PO TBCR
40.0000 meq | EXTENDED_RELEASE_TABLET | Freq: Once | ORAL | Status: AC
  Administered 2021-05-01: 40 meq via ORAL
  Filled 2021-05-01: qty 2

## 2021-05-01 MED FILL — Heparin Sod (Porcine)-NaCl IV Soln 1000 Unit/500ML-0.9%: INTRAVENOUS | Qty: 500 | Status: AC

## 2021-05-01 MED FILL — Lidocaine HCl Local Preservative Free (PF) Inj 1%: INTRAMUSCULAR | Qty: 30 | Status: AC

## 2021-05-01 NOTE — Progress Notes (Signed)
MD on call notified for pt having multiple PVCs, bigeminy and trigeminy. K 3.3. MD ordered 40 KCl and requested Mg level.

## 2021-05-01 NOTE — Progress Notes (Signed)
PV Navigator in place and have reviewed chart.  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  Patient is s/p abdominal aortogram with lower extremity run off 04/28/2021.  Scheduled for L femoral to below the knee popliteal artery bypass graft with R cephalic vein harvest and L 3rd toe amputation on 05/03/2021.  Followed by vascular and podiatry services.   Will continue to follow and be available for this patient should barriers arise.   Thank you, Cletis Media RN BSN CWS

## 2021-05-01 NOTE — Progress Notes (Addendum)
  Progress Note    05/01/2021 7:46 AM 3 Days Post-Op  Subjective:  Nerve pain in L foot but tolerable   Vitals:   04/30/21 2014 04/30/21 2307  BP:  (!) 144/54  Pulse:  87  Resp:  14  Temp: (!) 101 F (38.3 C) 99.4 F (37.4 C)  SpO2:  97%   Physical Exam: Lungs:  non labored Incisions:  R groin without hematoma Extremities:  palpable R femoral pulse; L 3rd toe gangrene with low intensity erythema of dorsal foot Neurologic: A&O  CBC    Component Value Date/Time   WBC 17.0 (H) 05/01/2021 0146   RBC 2.94 (L) 05/01/2021 0146   HGB 8.6 (L) 05/01/2021 0146   HGB 12.2 07/29/2020 0942   HCT 26.2 (L) 05/01/2021 0146   HCT 37.4 07/29/2020 0942   PLT 281 05/01/2021 0146   PLT 304 07/29/2020 0942   MCV 89.1 05/01/2021 0146   MCV 84 07/29/2020 0942   MCH 29.3 05/01/2021 0146   MCHC 32.8 05/01/2021 0146   RDW 14.0 05/01/2021 0146   RDW 14.8 07/29/2020 0942   LYMPHSABS 1.2 04/27/2021 1228   MONOABS 1.9 (H) 04/27/2021 1228   EOSABS 0.0 04/27/2021 1228   BASOSABS 0.1 04/27/2021 1228    BMET    Component Value Date/Time   NA 136 05/01/2021 0146   NA 142 07/29/2020 0942   K 3.3 (L) 05/01/2021 0146   CL 106 05/01/2021 0146   CO2 19 (L) 05/01/2021 0146   GLUCOSE 156 (H) 05/01/2021 0146   BUN 23 05/01/2021 0146   BUN 17 07/29/2020 0942   CREATININE 1.43 (H) 05/01/2021 0146   CREATININE 0.86 12/09/2019 1136   CALCIUM 8.2 (L) 05/01/2021 0146   GFRNONAA 37 (L) 05/01/2021 0146   GFRAA 75 07/29/2020 0942    INR    Component Value Date/Time   INR 1.1 04/27/2021 1228     Intake/Output Summary (Last 24 hours) at 05/01/2021 0746 Last data filed at 04/30/2021 0800 Gross per 24 hour  Intake --  Output 400 ml  Net -400 ml     Assessment/Plan:  80 y.o. female is s/p aortogram with BLE runoff 3 Days Post-Op   Plan is for L femoral to popliteal bypass with right cephalic vein on Wednesday Restrict RUE RLE bypass inflow will be addressed after she recovers from the  above Ok to continue IV antibiotics Patient plans to stay in house until Wednesday's surgery    Dagoberto Ligas, PA-C [facility] [phone] 05/01/2021 7:46 AM  VASCULAR STAFF ADDENDUM: I agree with the above.    [name]. Stanford Breed, MD [facility] Phone Number: [phone] 05/01/2021 12:56 PM

## 2021-05-01 NOTE — Progress Notes (Signed)
PROGRESS NOTE    [name]  POE:[id number] DOB: May 03, 1941 DOA: 04/27/2021 PCP: [name], MD  Brief Narrative:[name] is a 80 y.o. female with medical history significant of hypertension, hyperlipidemia, CAD s/p CABG in 2021, PVD s/p bypass grafting, diabetes mellitus type II, ray amputation of right foot presents with left third toe wound.  She had initially scraped her third toe on a barstool 3 days ago.  Subsequently developed redness pain and streaking going up her left leg associated with fevers and chills . She is s/p right common femoral endarterectomy with femoral popliteal bypass graft on 08/16/2020.  And previously had angioplasty with stent continue to have the left SFA. In the ED she was febrile, mildly tachycardic, WBC was 20 point 8K, x-ray of the left foot noted osteomyelitis of distal phalanx third toe     Assessment & Plan:   Left great toe gangrene Osteomyelitis, cellulitis Sepsis, poa -Remains on IV vancomycin and Zosyn day 4, febrile overnight -Blood cultures are negative -VVS and podiatry following,  -Underwent angiogram 8/5 noted to have severe left superficial femoral artery occlusive disease with 3 cm occlusion of left popliteal artery, 80% stenosis of distal right external iliac artery above existing femoropopliteal bypass  -Plan for left femoral to below-knee popliteal bypass and toe amputation on wednesday -remains stable, hold plavix  Acute kidney injury -Prerenal, resolving, off IV fluids now  SARS COVID-19 infection -Asymptomatic, monitor -Airborne isolation X 10 days  Essential hypertension -Continue metoprolol, holding HCTZ and Micardis  Type 2 diabetes mellitus -Hemoglobin A1c was 7.7 in April -Oral hypoglycemics on hold, continue sliding scale insulin for now  Chronic anemia -check anemia panel with am labs  CAD: s/p CABG in 03/2020 -Continue statin, aspirin,  -Hold Plavix for surgery  Peripheral vascular disease:  Patient is status post right foot ray amputation back in 07/2019. -Continue statin, and aspirin -Hold Plavix  DVT prophylaxis: Lovenox Code Status: Full code Family Communication: Discussed patient in detail, no family at bedside Disposition Plan:  Status is: Inpatient  Remains inpatient appropriate because:Inpatient level of care appropriate due to severity of illness  Dispo: The patient is from: Home              Anticipated d/c is to: Home              Patient currently is not medically stable to d/c.   Difficult to place patient No   Consultants:  Podiatry Vascular surgery  Procedures: Dr. Oneida Alar Procedure: Ultrasound right groin, abdominal aortogram with bilateral lower extremity runoff  Preoperative diagnosis: Gangrene left foot  Postoperative diagnosis: Same  Anesthesia: Local  Operative findings: 1.  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  80% stenosis distal right external iliac artery above existing femoral above-knee popliteal bypass runoff not well visualized secondary to sheath being occlusive in the right external iliac artery  Antimicrobials:    Subjective: -febrile to 101.3 overnight, feels ok today Objective: Vitals:   04/30/21 1929 04/30/21 2014 04/30/21 2307 05/01/21 0940  BP: (!) 152/57  (!) 144/54 (!) 171/78  Pulse: 98  87 61  Resp: 20  14 18   Temp: (!) 101.3 F (38.5 C) (!) 101 F (38.3 C) 99.4 F (37.4 C) 97.8 F (36.6 C)  TempSrc: Oral Oral Oral   SpO2: 99%  97% 95%  Weight:  Intake/Output Summary (Last 24 hours) at 05/01/2021 1034 Last data filed at 05/01/2021 7408 Gross per 24 hour  Intake --  Output 1000 ml  Net -1000 ml    Filed Weights   04/27/21 1500  Weight: 68.5 kg    Examination:  General exam: Gen: Awake, Alert, Oriented X 3,  HEENT: no  JVD Lungs: Good air movement bilaterally, CTAB CVS: S1S2/RRR Abd: soft, Non tender, non distended, BS present Extremities: Black discoloration of the distal third toe, diminished dorsalis pedis pulses Skin: as above Psychiatry:  Mood & affect appropriate.     Data Reviewed:   CBC: Recent Labs  Lab 04/27/21 1228 04/28/21 0243 04/28/21 1601 04/29/21 0155 05/01/21 0146  WBC 20.8* 14.4*  --  14.2* 17.0*  NEUTROABS 17.4*  --   --   --   --   HGB 10.5* 9.4* 9.9* 9.3* 8.6*  HCT 32.5* 28.2* 29.0* 28.1* 26.2*  MCV 89.8 87.6  --  88.4 89.1  PLT 238 204  --  215 144   Basic Metabolic Panel: Recent Labs  Lab 04/27/21 1228 04/28/21 0243 04/28/21 1601 04/29/21 0155 05/01/21 0146  NA 132* 135 138 133* 136  K 3.8 3.4* 3.0* 3.1* 3.3*  CL 97* 103 105 104 106  CO2 22 23  --  20* 19*  GLUCOSE 165* 102* 106* 95 156*  BUN 37* 35* 25* 26* 23  CREATININE 1.41* 1.25* 1.20* 1.24* 1.43*  CALCIUM 9.4 8.6*  --  8.4* 8.2*  MG  --   --   --   --  2.0   GFR: Estimated Creatinine Clearance: 30.7 mL/min (A) (by C-G formula based on SCr of 1.43 mg/dL (H)). Liver Function Tests: No results for input(s): AST, ALT, ALKPHOS, BILITOT, PROT, ALBUMIN in the last 168 hours. No results for input(s): LIPASE, AMYLASE in the last 168 hours. No results for input(s): AMMONIA in the last 168 hours. Coagulation Profile: Recent Labs  Lab 04/27/21 1228  INR 1.1   Cardiac Enzymes: No results for input(s): CKTOTAL, CKMB, CKMBINDEX, TROPONINI in the last 168 hours. BNP (last 3 results) No results for input(s): PROBNP in the last 8760 hours. HbA1C: No results for input(s): HGBA1C in the last 72 hours.  CBG: Recent Labs  Lab 04/30/21 0823 04/30/21 1246 04/30/21 1616 04/30/21 2042 05/01/21 0924  GLUCAP 101* 264* 146* 160* 103*   Lipid Profile: No results for input(s): CHOL, HDL, LDLCALC, TRIG, CHOLHDL, LDLDIRECT in the last 72 hours.  Thyroid Function Tests: No results for input(s): TSH, T4TOTAL,  FREET4, T3FREE, THYROIDAB in the last 72 hours. Anemia Panel: No results for input(s): VITAMINB12, FOLATE, FERRITIN, TIBC, IRON, RETICCTPCT in the last 72 hours. Urine analysis:    Component Value Date/Time   COLORURINE YELLOW 08/10/2020 [place] 08/10/2020 0952   LABSPEC 1.018 08/10/2020 0952   PHURINE 5.0 08/10/2020 0952   GLUCOSEU >=500 (A) 08/10/2020 0952   HGBUR NEGATIVE 08/10/2020 0952   BILIRUBINUR NEGATIVE 08/10/2020 0952   KETONESUR NEGATIVE 08/10/2020 0952   PROTEINUR NEGATIVE 08/10/2020 0952   NITRITE NEGATIVE 08/10/2020 0952   LEUKOCYTESUR SMALL (A) 08/10/2020 0952   Sepsis Labs: @LABRCNTIP (procalcitonin:4,lacticidven:4)  ) Recent Results (from the past 240 hour(s))  Blood culture (routine x 2)     Status: None (Preliminary result)   Collection Time: 04/27/21 12:14 PM   Specimen: BLOOD RIGHT HAND  Result Value Ref Range Status   Specimen Description BLOOD RIGHT HAND  Final   Special Requests   Final  BOTTLES DRAWN AEROBIC AND ANAEROBIC Blood Culture adequate volume   Culture   Final    NO GROWTH 3 DAYS Performed at [facility], [place] [address] [place], [place] [address]    Report Status PENDING  Incomplete  Blood culture (routine x 2)     Status: None (Preliminary result)   Collection Time: 04/27/21 12:28 PM   Specimen: BLOOD LEFT ARM  Result Value Ref Range Status   Specimen Description BLOOD LEFT ARM  Final   Special Requests   Final    BOTTLES DRAWN AEROBIC AND ANAEROBIC Blood Culture adequate volume   Culture   Final    NO GROWTH 3 DAYS Performed at [facility], [place] [address], [place], [place] [address]    Report Status PENDING  Incomplete  Resp Panel by RT-PCR (Flu A&B, Covid) Nasopharyngeal Swab     Status: Abnormal   Collection Time: 04/27/21  4:40 PM   Specimen: Nasopharyngeal Swab; Nasopharyngeal(NP) swabs in vial transport medium  Result Value Ref Range Status   SARS Coronavirus 2 by RT PCR POSITIVE (A)  NEGATIVE Final    Comment: RESULT CALLED TO, READ BACK BY AND VERIFIED WITHTish Men RN 1806 04/27/21 A BROWNING (NOTE) SARS-CoV-2 target nucleic acids are DETECTED.  The SARS-CoV-2 RNA is generally detectable in upper respiratory specimens during the acute phase of infection. Positive results are indicative of the presence of the identified virus, but do not rule out bacterial infection or co-infection with other pathogens not detected by the test. Clinical correlation with patient history and other diagnostic information is necessary to determine patient infection status. The expected result is Negative.  Fact Sheet for Patients: EntrepreneurPulse.com.au  Fact Sheet for Healthcare Providers: IncredibleEmployment.be  This test is not yet approved or cleared by the [place] FDA and  has been authorized for detection and/or diagnosis of SARS-CoV-2 by FDA under an Emergency Use Authorization (EUA).  This EUA will remain in effect (meaning this test can  be used) for the duration of  the COVID-19 declaration under Section 564(b)(1) of the Act, 21 U.S.C. section 360bbb-3(b)(1), unless the authorization is terminated or revoked sooner.     Influenza A by PCR NEGATIVE NEGATIVE Final   Influenza B by PCR NEGATIVE NEGATIVE Final    Comment: (NOTE) The Xpert Xpress SARS-CoV-2/FLU/RSV plus assay is intended as an aid in the diagnosis of influenza from Nasopharyngeal swab specimens and should not be used as a sole basis for treatment. Nasal washings and aspirates are unacceptable for Xpert Xpress SARS-CoV-2/FLU/RSV testing.  Fact Sheet for Patients: EntrepreneurPulse.com.au  Fact Sheet for Healthcare Providers: IncredibleEmployment.be  This test is not yet approved or cleared by the [place] FDA and has been authorized for detection and/or diagnosis of SARS-CoV-2 by FDA under an Emergency Use  Authorization (EUA). This EUA will remain in effect (meaning this test can be used) for the duration of the COVID-19 declaration under Section 564(b)(1) of the Act, 21 U.S.C. section 360bbb-3(b)(1), unless the authorization is terminated or revoked.  Performed at [facility], [place] [address], [place], [place] [address]   Surgical PCR screen     Status: Abnormal   Collection Time: 04/27/21  9:31 PM   Specimen: Nasal Mucosa; Nasal Swab  Result Value Ref Range Status   MRSA, PCR NEGATIVE NEGATIVE Final   Staphylococcus aureus POSITIVE (A) NEGATIVE Final    Comment: (NOTE) The Xpert SA Assay (FDA approved for NASAL specimens in patients 22 years of  age and older), is one component of a comprehensive surveillance program. It is not intended to diagnose infection nor to guide or monitor treatment. Performed at [facility], [place] [address] [place],  [address]          Radiology Studies: VAS [place] UPPER EXT VEIN MAPPING (PRE-OP AVF)  Result Date: 04/30/2021 UPPER EXTREMITY VEIN MAPPING Patient Name:  SHAHD OCCHIPINTI  Date of Exam:   04/29/2021 Medical Rec #: [id number]      Accession #:    [id number] Date of Birth: November 25, 1940      Patient Gender: F Patient Age:   52 years Exam Location:  [facility] Procedure:      VAS [place] UPPER EXT VEIN MAPPING (PRE-OP AVF) Referring Phys: Jamelle Haring --------------------------------------------------------------------------------  Indications: Pre-access. History: PAD.  Comparison Study: No prior upper extremity vein mapping. Prior lower extremity                   vein mapping done 11/21 Performing Technologist: Sharion Dove RVS  Examination Guidelines: A complete evaluation includes B-mode imaging, spectral Doppler, color Doppler, and power Doppler as needed of all accessible portions of each vessel. Bilateral testing is considered an integral part of a complete examination. Limited examinations for reoccurring indications may be  performed as noted. +-----------------+-------------+----------+---------+ Right Cephalic   Diameter (cm)Depth (cm)Findings  +-----------------+-------------+----------+---------+ Prox upper arm       0.30        0.79             +-----------------+-------------+----------+---------+ Mid upper arm        0.32        0.88   branching +-----------------+-------------+----------+---------+ Dist upper arm       0.30        0.70             +-----------------+-------------+----------+---------+ Antecubital fossa    0.38        0.58             +-----------------+-------------+----------+---------+ Prox forearm         0.24        0.17   branching +-----------------+-------------+----------+---------+ Mid forearm          0.29        0.52   branching +-----------------+-------------+----------+---------+ Dist forearm         0.29        0.36   branching +-----------------+-------------+----------+---------+ Wrist                0.29        0.35   thrombus  +-----------------+-------------+----------+---------+ +-----------------+-------------+----------+---------+ Left Cephalic    Diameter (cm)Depth (cm)Findings  +-----------------+-------------+----------+---------+ Prox upper arm       0.28        0.13             +-----------------+-------------+----------+---------+ Mid upper arm        0.33        0.93             +-----------------+-------------+----------+---------+ Dist upper arm       0.37        0.91             +-----------------+-------------+----------+---------+ Antecubital fossa    0.45        0.23             +-----------------+-------------+----------+---------+ Prox forearm         0.28        0.66  branching +-----------------+-------------+----------+---------+ Mid forearm          0.20        0.44             +-----------------+-------------+----------+---------+ Dist forearm         0.16        0.24              +-----------------+-------------+----------+---------+ Wrist                0.16        0.25             +-----------------+-------------+----------+---------+ +-----------------+-------------+----------+--------------+ Left Basilic     Diameter (cm)Depth (cm)   Findings    +-----------------+-------------+----------+--------------+ Prox upper arm       0.30        0.68                  +-----------------+-------------+----------+--------------+ Mid upper arm        0.33        0.70                  +-----------------+-------------+----------+--------------+ Dist upper arm       0.29        0.80                  +-----------------+-------------+----------+--------------+ Antecubital fossa    0.41        0.23                  +-----------------+-------------+----------+--------------+ Prox forearm         0.25        0.60     branching    +-----------------+-------------+----------+--------------+ Mid forearm          0.20        0.34                  +-----------------+-------------+----------+--------------+ Distal forearm       0.19        0.30                  +-----------------+-------------+----------+--------------+ Wrist                                   not visualized +-----------------+-------------+----------+--------------+ *See table(s) above for measurements and observations.  Diagnosing physician: Jamelle Haring Electronically signed by Jamelle Haring on 04/30/2021 at 2:01:19 PM.    Final         Scheduled Meds:  ascorbic acid  250 mg Oral Q supper   aspirin EC  81 mg Oral QHS   atorvastatin  80 mg Oral Daily   brimonidine  1 drop Right Eye BID   enoxaparin (LOVENOX) injection  40 mg Subcutaneous q1600   insulin aspart  0-15 Units Subcutaneous TID WC   metoprolol tartrate  37.5 mg Oral BID   mupirocin ointment  1 application Nasal BID   pantoprazole  40 mg Oral Once per day on Mon Wed Fri   pyridoxine  100 mg Oral QPM   saccharomyces  boulardii  250 mg Oral BID   sodium chloride flush  3 mL Intravenous Q12H   sodium chloride flush  3 mL Intravenous Q12H   Continuous Infusions:  sodium chloride     piperacillin-tazobactam 3.375 g (05/01/21 0927)   vancomycin 750 mg (04/30/21 2019)     LOS: 4 days    Time spent: 21min  Domenic Polite, MD [facility]   05/01/2021, 10:34 AM

## 2021-05-02 DIAGNOSIS — M86172 Other acute osteomyelitis, left ankle and foot: Secondary | ICD-10-CM | POA: Diagnosis not present

## 2021-05-02 LAB — FERRITIN: Ferritin: 197 ng/mL (ref 11–307)

## 2021-05-02 LAB — GLUCOSE, CAPILLARY
Glucose-Capillary: 122 mg/dL — ABNORMAL HIGH (ref 70–99)
Glucose-Capillary: 173 mg/dL — ABNORMAL HIGH (ref 70–99)
Glucose-Capillary: 116 mg/dL — ABNORMAL HIGH (ref 70–99)
Glucose-Capillary: 208 mg/dL — ABNORMAL HIGH (ref 70–99)

## 2021-05-02 LAB — RETICULOCYTES
Immature Retic Fract: 33.3 % — ABNORMAL HIGH (ref 2.3–15.9)
RBC.: 2.91 MIL/uL — ABNORMAL LOW (ref 3.87–5.11)
Retic Count, Absolute: 35.8 10*3/uL (ref 19.0–186.0)
Retic Ct Pct: 1.2 % (ref 0.4–3.1)

## 2021-05-02 LAB — CBC
HCT: 25 % — ABNORMAL LOW (ref 36.0–46.0)
Hemoglobin: 8.4 g/dL — ABNORMAL LOW (ref 12.0–15.0)
MCH: 29.2 pg (ref 26.0–34.0)
MCHC: 33.6 g/dL (ref 30.0–36.0)
MCV: 86.8 fL (ref 80.0–100.0)
Platelets: 328 10*3/uL (ref 150–400)
RBC: 2.88 MIL/uL — ABNORMAL LOW (ref 3.87–5.11)
RDW: 13.9 % (ref 11.5–15.5)
WBC: 17 10*3/uL — ABNORMAL HIGH (ref 4.0–10.5)
nRBC: 0 % (ref 0.0–0.2)

## 2021-05-02 LAB — CULTURE, BLOOD (ROUTINE X 2)
Culture: NO GROWTH
Culture: NO GROWTH
Special Requests: ADEQUATE
Special Requests: ADEQUATE

## 2021-05-02 LAB — IRON AND TIBC
Iron: 11 ug/dL — ABNORMAL LOW (ref 28–170)
Saturation Ratios: 6 % — ABNORMAL LOW (ref 10.4–31.8)
TIBC: 179 ug/dL — ABNORMAL LOW (ref 250–450)
UIBC: 168 ug/dL

## 2021-05-02 LAB — BASIC METABOLIC PANEL
Anion gap: 10 (ref 5–15)
BUN: 20 mg/dL (ref 8–23)
CO2: 18 mmol/L — ABNORMAL LOW (ref 22–32)
Calcium: 8.1 mg/dL — ABNORMAL LOW (ref 8.9–10.3)
Chloride: 105 mmol/L (ref 98–111)
Creatinine, Ser: 1.38 mg/dL — ABNORMAL HIGH (ref 0.44–1.00)
GFR, Estimated: 39 mL/min — ABNORMAL LOW (ref >60)
Glucose, Bld: 145 mg/dL — ABNORMAL HIGH (ref 70–99)
Potassium: 3.6 mmol/L (ref 3.5–5.1)
Sodium: 133 mmol/L — ABNORMAL LOW (ref 135–145)

## 2021-05-02 LAB — FOLATE: Folate: 11.1 ng/mL (ref >5.9)

## 2021-05-02 LAB — VITAMIN B12: Vitamin B-12: 592 pg/mL (ref 180–914)

## 2021-05-02 MED ORDER — POLYETHYLENE GLYCOL 3350 17 G PO PACK
17.0000 g | PACK | Freq: Every day | ORAL | Status: DC
  Administered 2021-05-02 – 2021-05-13 (×4): 17 g via ORAL
  Filled 2021-05-02 (×12): qty 1

## 2021-05-02 NOTE — Progress Notes (Signed)
[facility] [place] PROGRESS NOTE  ASSESSMENT / PLAN: [name] is a 80 y.o. female with left leg chronic limb threatening ischemia. Needs left fem-pop bypass. Right cephalic vein appears adequate for conduit. Plan OR tomorrow.   SUBJECTIVE: No complaints. Reviewed operative plan.  OBJECTIVE: BP (!) 159/54 (BP Location: Left Arm)   Pulse 90   Temp 98.7 F (37.1 C) (Oral)   Resp 15   Wt 68.5 kg   SpO2 97%   BMI 25.52 kg/m   Intake/Output Summary (Last 24 hours) at 05/02/2021 0830 Last data filed at 05/01/2021 1631 Gross per 24 hour  Intake --  Output 1600 ml  Net -1600 ml    No acute distress Unlabored breathing Unchanged appearance of L foot  CBC Latest Ref Rng & Units 05/02/2021 05/01/2021 04/29/2021  WBC 4.0 - 10.5 K/uL 17.0(H) 17.0(H) 14.2(H)  Hemoglobin 12.0 - 15.0 g/dL 8.4(L) 8.6(L) 9.3(L)  Hematocrit 36.0 - 46.0 % 25.0(L) 26.2(L) 28.1(L)  Platelets 150 - 400 K/uL 328 281 215     CMP Latest Ref Rng & Units 05/02/2021 05/01/2021 04/29/2021  Glucose 70 - 99 mg/dL 145(H) 156(H) 95  BUN 8 - 23 mg/dL 20 23 26(H)  Creatinine 0.44 - 1.00 mg/dL 1.38(H) 1.43(H) 1.24(H)  Sodium 135 - 145 mmol/L 133(L) 136 133(L)  Potassium 3.5 - 5.1 mmol/L 3.6 3.3(L) 3.1(L)  Chloride 98 - 111 mmol/L 105 106 104  CO2 22 - 32 mmol/L 18(L) 19(L) 20(L)  Calcium 8.9 - 10.3 mg/dL 8.1(L) 8.2(L) 8.4(L)  Total Protein 6.5 - 8.1 g/dL - - -  Total Bilirubin 0.3 - 1.2 mg/dL - - -  Alkaline Phos 38 - 126 U/L - - -  AST 15 - 41 U/L - - -  ALT 0 - 44 U/L - - -    Estimated Creatinine Clearance: 31.8 mL/min (A) (by C-G formula based on SCr of 1.38 mg/dL (H)).   [name]. Stanford Breed, MD [facility] Phone Number: [phone] 05/02/2021 8:30 AM

## 2021-05-02 NOTE — Care Management Important Message (Signed)
Important Message  Patient Details  Name: [name] MRN: [id number] Date of Birth: October 12, 1940   Medicare Important Message Given:  Yes     Shelda Altes 05/02/2021, 12:06 PM

## 2021-05-02 NOTE — Progress Notes (Signed)
PROGRESS NOTE    [name]  UYQ:[id number] DOB: 11/03/1940 DOA: 04/27/2021 PCP: [name], MD  Brief Narrative:[name] is a 80 y.o. female with medical history significant of hypertension, hyperlipidemia, CAD s/p CABG in 2021, PVD s/p bypass grafting, diabetes mellitus type II, ray amputation of right foot presented with left third toe wound.  developed redness pain and streaking going up her left leg associated with fevers and chills . She is s/p right common femoral endarterectomy with femoral popliteal bypass graft on 08/16/2020.  And previously had angioplasty with stent continue to have the left SFA. In the ED she was febrile, mildly tachycardic, WBC was 20.8K, x-ray of the left foot noted osteomyelitis of distal phalanx third toe. 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 with 3 cm occlusion of left popliteal artery, 80% stenosis of distal right external iliac artery above existing femoropopliteal bypass  -Plan for left femoral to below-knee popliteal bypass and toe amputation tomorrow -remains stable, hold plavix  Acute kidney injury -Prerenal, improved, creatinine stable  SARS COVID-19 infection -Asymptomatic, monitor -Airborne isolation X 10 days  Essential hypertension -Continue metoprolol, holding HCTZ and Micardis  Type 2 diabetes mellitus -Hemoglobin A1c was 7.7 in April -Oral hypoglycemics on hold, continue  sliding scale insulin for now  Chronic anemia -Anemia panel with severe iron deficiency, will give IV iron post op  CAD: s/p CABG in 03/2020 -Continue statin, aspirin,  -Hold Plavix for surgery  Peripheral vascular disease: Patient is status post right foot ray amputation back in 07/2019. -Continue statin, and aspirin -Hold Plavix  DVT prophylaxis: Hold Lovenox for surgery tomorrow Code Status: Full code Family Communication: Discussed patient in detail, no family at bedside Disposition Plan:  Status is: Inpatient  Remains inpatient appropriate because:Inpatient level of care appropriate due to severity of illness  Dispo: The patient is from: Home              Anticipated d/c is to: Home              Patient currently is not medically stable to d/c.   Difficult to place patient No   Consultants:  Podiatry Vascular surgery  Procedures: Dr. Oneida Alar Procedure: Ultrasound right groin, abdominal aortogram with bilateral lower extremity runoff  Preoperative diagnosis: Gangrene left foot  Postoperative diagnosis: Same  Anesthesia: Local  Operative findings: 1.  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  80% stenosis distal right external iliac artery above existing femoral above-knee popliteal bypass runoff not well visualized secondary to sheath being occlusive in the right external iliac artery  Antimicrobials:    Subjective: -Feels okay overall, febrile to 100.4 overnight, left foot pain is improving, toe is unchanged Objective: Vitals:   05/01/21 1127 05/01/21 1608 05/01/21 2330 05/02/21 0356  BP: (!) 134/52 (!) 153/49 (!) 142/47 (!) 159/54  Pulse: 86 96 88 90  Resp: 19 20 20  15  Temp: 98.3 F (36.8 C) 98.7 F (37.1 C) (!) 100.8 F (38.2 C) 98.7 F (37.1 C)  TempSrc: Oral Oral Oral Oral  SpO2: 98%  100% 99% 97%  Weight:        Intake/Output Summary (Last 24 hours) at 05/02/2021 1115 Last data filed at 05/01/2021 1631 Gross per 24 hour  Intake --  Output 600 ml  Net -600 ml    Filed Weights   04/27/21 1500  Weight: 68.5 kg    Examination:  General exam: Pleasant elderly female sitting up in bed, AAOx3, no distress HEENT: No JVD CVS: S1-S2, regular rate rhythm Lungs: Clear bilaterally Abdomen: Soft, nontender, bowel sounds present  Extremities: Black discoloration of the distal third toe, diminished dorsalis pedis pulses Skin: as above Psychiatry:  Mood & affect appropriate.     Data Reviewed:   CBC: Recent Labs  Lab 04/27/21 1228 04/28/21 0243 04/28/21 1601 04/29/21 0155 05/01/21 0146 05/02/21 0141  WBC 20.8* 14.4*  --  14.2* 17.0* 17.0*  NEUTROABS 17.4*  --   --   --   --   --   HGB 10.5* 9.4* 9.9* 9.3* 8.6* 8.4*  HCT 32.5* 28.2* 29.0* 28.1* 26.2* 25.0*  MCV 89.8 87.6  --  88.4 89.1 86.8  PLT 238 204  --  215 281 782   Basic Metabolic Panel: Recent Labs  Lab 04/27/21 1228 04/28/21 0243 04/28/21 1601 04/29/21 0155 05/01/21 0146 05/02/21 0141  NA 132* 135 138 133* 136 133*  K 3.8 3.4* 3.0* 3.1* 3.3* 3.6  CL 97* 103 105 104 106 105  CO2 22 23  --  20* 19* 18*  GLUCOSE 165* 102* 106* 95 156* 145*  BUN 37* 35* 25* 26* 23 20  CREATININE 1.41* 1.25* 1.20* 1.24* 1.43* 1.38*  CALCIUM 9.4 8.6*  --  8.4* 8.2* 8.1*  MG  --   --   --   --  2.0  --    GFR: Estimated Creatinine Clearance: 31.8 mL/min (A) (by C-G formula based on SCr of 1.38 mg/dL (H)). Liver Function Tests: No results for input(s): AST, ALT, ALKPHOS, BILITOT, PROT, ALBUMIN in the last 168 hours. No results for input(s): LIPASE, AMYLASE in the last 168 hours. No results for input(s): AMMONIA in the last 168 hours. Coagulation Profile: Recent Labs  Lab 04/27/21 1228  INR 1.1   Cardiac Enzymes: No results for input(s): CKTOTAL, CKMB, CKMBINDEX, TROPONINI in the last 168 hours. BNP  (last 3 results) No results for input(s): PROBNP in the last 8760 hours. HbA1C: No results for input(s): HGBA1C in the last 72 hours.  CBG: Recent Labs  Lab 05/01/21 0924 05/01/21 1123 05/01/21 1606 05/01/21 2028 05/02/21 0629  GLUCAP 103* 241* 186* 194* 122*   Lipid Profile: No results for input(s): CHOL, HDL, LDLCALC, TRIG, CHOLHDL, LDLDIRECT in the last 72 hours.  Thyroid Function Tests: No results for input(s): TSH, T4TOTAL, FREET4, T3FREE, THYROIDAB in the last 72 hours. Anemia Panel: Recent Labs    05/02/21 0141  VITAMINB12 592  FOLATE 11.1  FERRITIN 197  TIBC 179*  IRON 11*  RETICCTPCT 1.2   Urine analysis:    Component Value Date/Time   COLORURINE YELLOW 08/10/2020 [place] 08/10/2020 0952   LABSPEC 1.018 08/10/2020 0952   PHURINE 5.0 08/10/2020 0952   GLUCOSEU >=500 (A) 08/10/2020 0952   HGBUR NEGATIVE 08/10/2020 [place] 08/10/2020 [place] 08/10/2020 [place] 08/10/2020 9562  NITRITE NEGATIVE 08/10/2020 0952   LEUKOCYTESUR SMALL (A) 08/10/2020 0952   Sepsis Labs: @LABRCNTIP (procalcitonin:4,lacticidven:4)  ) Recent Results (from the past 240 hour(s))  Blood culture (routine x 2)     Status: None   Collection Time: 04/27/21 12:14 PM   Specimen: BLOOD RIGHT HAND  Result Value Ref Range Status   Specimen Description BLOOD RIGHT HAND  Final   Special Requests   Final    BOTTLES DRAWN AEROBIC AND ANAEROBIC Blood Culture adequate volume   Culture   Final    NO GROWTH 5 DAYS Performed at [facility], [place] [address], [place], [place] [address]    Report Status 05/02/2021 FINAL  Final  Blood culture (routine x 2)     Status: None   Collection Time: 04/27/21 12:28 PM   Specimen: BLOOD LEFT ARM  Result Value Ref Range Status   Specimen Description BLOOD LEFT ARM  Final   Special Requests   Final    BOTTLES DRAWN AEROBIC AND ANAEROBIC Blood Culture adequate volume    Culture   Final    NO GROWTH 5 DAYS Performed at [facility], [place] [address] [place], [place] [address]    Report Status 05/02/2021 FINAL  Final  Resp Panel by RT-PCR (Flu A&B, Covid) Nasopharyngeal Swab     Status: Abnormal   Collection Time: 04/27/21  4:40 PM   Specimen: Nasopharyngeal Swab; Nasopharyngeal(NP) swabs in vial transport medium  Result Value Ref Range Status   SARS Coronavirus 2 by RT PCR POSITIVE (A) NEGATIVE Final    Comment: RESULT CALLED TO, READ BACK BY AND VERIFIED WITHTish Men RN 1806 04/27/21 A BROWNING (NOTE) SARS-CoV-2 target nucleic acids are DETECTED.  The SARS-CoV-2 RNA is generally detectable in upper respiratory specimens during the acute phase of infection. Positive results are indicative of the presence of the identified virus, but do not rule out bacterial infection or co-infection with other pathogens not detected by the test. Clinical correlation with patient history and other diagnostic information is necessary to determine patient infection status. The expected result is Negative.  Fact Sheet for Patients: EntrepreneurPulse.com.au  Fact Sheet for Healthcare Providers: IncredibleEmployment.be  This test is not yet approved or cleared by the [place] FDA and  has been authorized for detection and/or diagnosis of SARS-CoV-2 by FDA under an Emergency Use Authorization (EUA).  This EUA will remain in effect (meaning this test can  be used) for the duration of  the COVID-19 declaration under Section 564(b)(1) of the Act, 21 U.S.C. section 360bbb-3(b)(1), unless the authorization is terminated or revoked sooner.     Influenza A by PCR NEGATIVE NEGATIVE Final   Influenza B by PCR NEGATIVE NEGATIVE Final    Comment: (NOTE) The Xpert Xpress SARS-CoV-2/FLU/RSV plus assay is intended as an aid in the diagnosis of influenza from Nasopharyngeal swab specimens and should not be used as a sole basis for  treatment. Nasal washings and aspirates are unacceptable for Xpert Xpress SARS-CoV-2/FLU/RSV testing.  Fact Sheet for Patients: EntrepreneurPulse.com.au  Fact Sheet for Healthcare Providers: IncredibleEmployment.be  This test is not yet approved or cleared by the [place] FDA and has been authorized for detection and/or diagnosis of SARS-CoV-2 by FDA under an Emergency Use Authorization (EUA). This EUA will remain in effect (meaning this test can be used) for the duration of the COVID-19 declaration under Section 564(b)(1) of the Act, 21 U.S.C. section 360bbb-3(b)(1), unless the authorization is terminated or revoked.  Performed at [facility], [place] [address] [place], [place] [address]   Surgical PCR screen     Status: Abnormal   Collection Time: 04/27/21  9:31 PM   Specimen: Nasal Mucosa; Nasal Swab  Result Value Ref Range Status   MRSA, PCR NEGATIVE NEGATIVE Final   Staphylococcus aureus POSITIVE (A) NEGATIVE Final    Comment: (NOTE) The Xpert SA Assay (FDA approved for NASAL specimens in patients 41 years of age and older), is one component of a comprehensive surveillance program. It is not intended to diagnose infection nor to guide or monitor treatment. Performed at [facility], [place] [address], [place], [place] [address]      Scheduled Meds:  ascorbic acid  250 mg Oral Q supper   aspirin EC  81 mg Oral QHS   atorvastatin  80 mg Oral Daily   brimonidine  1 drop Right Eye BID   enoxaparin (LOVENOX) injection  40 mg Subcutaneous q1600   insulin aspart  0-15 Units Subcutaneous TID WC   metoprolol tartrate  37.5 mg Oral BID   pantoprazole  40 mg Oral Once per day on Mon Wed Fri   polyethylene glycol  17 g Oral Daily   pyridoxine  100 mg Oral QPM   saccharomyces boulardii  250 mg Oral BID   sodium chloride flush  3 mL Intravenous Q12H   sodium chloride flush  3 mL Intravenous Q12H   Continuous Infusions:   sodium chloride     piperacillin-tazobactam 3.375 g (05/02/21 0806)   vancomycin 1,250 mg (05/01/21 2301)     LOS: 5 days    Time spent: 32min  Domenic Polite, MD [facility]   05/02/2021, 11:15 AM

## 2021-05-03 ENCOUNTER — Inpatient Hospital Stay (HOSPITAL_COMMUNITY): Payer: Medicare Other | Admitting: Certified Registered"

## 2021-05-03 ENCOUNTER — Encounter (HOSPITAL_COMMUNITY)
Admission: EM | Disposition: A | Discharge status: Home Health | Payer: Self-pay | Source: Home / Self Care | Attending: Internal Medicine

## 2021-05-03 DIAGNOSIS — M86172 Other acute osteomyelitis, left ankle and foot: Secondary | ICD-10-CM | POA: Diagnosis not present

## 2021-05-03 DIAGNOSIS — I70262 Atherosclerosis of native arteries of extremities with gangrene, left leg: Secondary | ICD-10-CM

## 2021-05-03 HISTORY — PX: AMPUTATION TOE: SHX6595

## 2021-05-03 HISTORY — PX: FEMORAL-POPLITEAL BYPASS GRAFT: SHX937

## 2021-05-03 LAB — GLUCOSE, CAPILLARY
Glucose-Capillary: 147 mg/dL — ABNORMAL HIGH (ref 70–99)
Glucose-Capillary: 110 mg/dL — ABNORMAL HIGH (ref 70–99)
Glucose-Capillary: 139 mg/dL — ABNORMAL HIGH (ref 70–99)
Glucose-Capillary: 152 mg/dL — ABNORMAL HIGH (ref 70–99)

## 2021-05-03 LAB — PREPARE RBC (CROSSMATCH)

## 2021-05-03 LAB — CBC
HCT: 29.7 % — ABNORMAL LOW (ref 36.0–46.0)
Hemoglobin: 9.7 g/dL — ABNORMAL LOW (ref 12.0–15.0)
MCH: 29.5 pg (ref 26.0–34.0)
MCHC: 32.7 g/dL (ref 30.0–36.0)
MCV: 90.3 fL (ref 80.0–100.0)
Platelets: 434 10*3/uL — ABNORMAL HIGH (ref 150–400)
RBC: 3.29 MIL/uL — ABNORMAL LOW (ref 3.87–5.11)
RDW: 14.2 % (ref 11.5–15.5)
WBC: 27.9 10*3/uL — ABNORMAL HIGH (ref 4.0–10.5)
nRBC: 0.1 % (ref 0.0–0.2)

## 2021-05-03 LAB — BASIC METABOLIC PANEL
Anion gap: 13 (ref 5–15)
BUN: 18 mg/dL (ref 8–23)
CO2: 19 mmol/L — ABNORMAL LOW (ref 22–32)
Calcium: 8.6 mg/dL — ABNORMAL LOW (ref 8.9–10.3)
Chloride: 103 mmol/L (ref 98–111)
Creatinine, Ser: 1.28 mg/dL — ABNORMAL HIGH (ref 0.44–1.00)
GFR, Estimated: 43 mL/min — ABNORMAL LOW (ref >60)
Glucose, Bld: 155 mg/dL — ABNORMAL HIGH (ref 70–99)
Potassium: 3.3 mmol/L — ABNORMAL LOW (ref 3.5–5.1)
Sodium: 135 mmol/L (ref 135–145)

## 2021-05-03 SURGERY — BYPASS GRAFT FEMORAL-POPLITEAL ARTERY
Anesthesia: General | Site: Toe | Laterality: Right

## 2021-05-03 MED ORDER — 0.9 % SODIUM CHLORIDE (POUR BTL) OPTIME
TOPICAL | Status: DC | PRN
  Administered 2021-05-03: 2000 mL

## 2021-05-03 MED ORDER — SODIUM CHLORIDE 0.9 % IV SOLN: INTRAVENOUS | Status: AC

## 2021-05-03 MED ORDER — PROPOFOL 10 MG/ML IV BOLUS
INTRAVENOUS | Status: AC
  Filled 2021-05-03: qty 20

## 2021-05-03 MED ORDER — LIDOCAINE 2% (20 MG/ML) 5 ML SYRINGE
INTRAMUSCULAR | Status: DC | PRN
  Administered 2021-05-03: 80 mg via INTRAVENOUS

## 2021-05-03 MED ORDER — LACTATED RINGERS IV SOLN: INTRAVENOUS | Status: DC | PRN

## 2021-05-03 MED ORDER — LABETALOL HCL 5 MG/ML IV SOLN
INTRAVENOUS | Status: AC
  Administered 2021-05-03: 10 mg via INTRAVENOUS
  Filled 2021-05-03: qty 4

## 2021-05-03 MED ORDER — FENTANYL CITRATE (PF) 100 MCG/2ML IJ SOLN: 25.0000 ug | INTRAMUSCULAR | Status: DC | PRN

## 2021-05-03 MED ORDER — FENTANYL CITRATE (PF) 250 MCG/5ML IJ SOLN
INTRAMUSCULAR | Status: AC
  Filled 2021-05-03: qty 5

## 2021-05-03 MED ORDER — GUAIFENESIN-DM 100-10 MG/5ML PO SYRP: 15.0000 mL | ORAL_SOLUTION | ORAL | Status: DC | PRN

## 2021-05-03 MED ORDER — DOCUSATE SODIUM 100 MG PO CAPS
100.0000 mg | ORAL_CAPSULE | Freq: Every day | ORAL | Status: DC
  Administered 2021-05-05 – 2021-05-15 (×6): 100 mg via ORAL
  Filled 2021-05-03 (×10): qty 1

## 2021-05-03 MED ORDER — SUGAMMADEX SODIUM 200 MG/2ML IV SOLN
INTRAVENOUS | Status: DC | PRN
  Administered 2021-05-03: 200 mg via INTRAVENOUS

## 2021-05-03 MED ORDER — PROPOFOL 10 MG/ML IV BOLUS
INTRAVENOUS | Status: DC | PRN
  Administered 2021-05-03: 100 mg via INTRAVENOUS

## 2021-05-03 MED ORDER — HEPARIN (PORCINE) 25000 UT/250ML-% IV SOLN
500.0000 [IU]/h | INTRAVENOUS | Status: DC
  Administered 2021-05-03 (×2): 500 [IU]/h via INTRAVENOUS
  Filled 2021-05-03: qty 250

## 2021-05-03 MED ORDER — SODIUM CHLORIDE 0.9 % IV SOLN
500.0000 mL | Freq: Once | INTRAVENOUS | Status: DC | PRN
Start: 2021-05-03 — End: 2021-05-15

## 2021-05-03 MED ORDER — CHLORHEXIDINE GLUCONATE CLOTH 2 % EX PADS
6.0000 | MEDICATED_PAD | Freq: Every day | CUTANEOUS | Status: DC
  Administered 2021-05-04 – 2021-05-14 (×7): 6 via TOPICAL

## 2021-05-03 MED ORDER — HEPARIN 6000 UNIT IRRIGATION SOLUTION
Status: AC
  Filled 2021-05-03: qty 500

## 2021-05-03 MED ORDER — ALUM & MAG HYDROXIDE-SIMETH 200-200-20 MG/5ML PO SUSP: 15.0000 mL | ORAL | Status: DC | PRN

## 2021-05-03 MED ORDER — PHENYLEPHRINE HCL-NACL 20-0.9 MG/250ML-% IV SOLN
INTRAVENOUS | Status: DC | PRN
  Administered 2021-05-03: 25 ug/min via INTRAVENOUS

## 2021-05-03 MED ORDER — AMISULPRIDE (ANTIEMETIC) 5 MG/2ML IV SOLN
10.0000 mg | Freq: Once | INTRAVENOUS | Status: DC | PRN
Start: 2021-05-03 — End: 2021-05-03

## 2021-05-03 MED ORDER — HEPARIN 6000 UNIT IRRIGATION SOLUTION
Status: DC | PRN
  Administered 2021-05-03: 1

## 2021-05-03 MED ORDER — HEPARIN SODIUM (PORCINE) 1000 UNIT/ML IJ SOLN
INTRAMUSCULAR | Status: DC | PRN
  Administered 2021-05-03: 7000 [IU] via INTRAVENOUS
  Administered 2021-05-03: 5000 [IU] via INTRAVENOUS

## 2021-05-03 MED ORDER — ALBUMIN HUMAN 5 % IV SOLN: INTRAVENOUS | Status: DC | PRN

## 2021-05-03 MED ORDER — SUCCINYLCHOLINE CHLORIDE 200 MG/10ML IV SOSY
PREFILLED_SYRINGE | INTRAVENOUS | Status: DC | PRN
  Administered 2021-05-03: 100 mg via INTRAVENOUS

## 2021-05-03 MED ORDER — ROCURONIUM BROMIDE 10 MG/ML (PF) SYRINGE
PREFILLED_SYRINGE | INTRAVENOUS | Status: DC | PRN
  Administered 2021-05-03: 50 mg via INTRAVENOUS

## 2021-05-03 MED ORDER — POTASSIUM CHLORIDE CRYS ER 20 MEQ PO TBCR: 20.0000 meq | EXTENDED_RELEASE_TABLET | Freq: Every day | ORAL | Status: DC | PRN

## 2021-05-03 MED ORDER — HEMOSTATIC AGENTS (NO CHARGE) OPTIME
TOPICAL | Status: DC | PRN
  Administered 2021-05-03: 1 via TOPICAL

## 2021-05-03 MED ORDER — ONDANSETRON HCL 4 MG/2ML IJ SOLN
INTRAMUSCULAR | Status: DC | PRN
  Administered 2021-05-03: 4 mg via INTRAVENOUS

## 2021-05-03 MED ORDER — MAGNESIUM SULFATE 2 GM/50ML IV SOLN
2.0000 g | Freq: Every day | INTRAVENOUS | Status: DC | PRN
Start: 2021-05-03 — End: 2021-05-15

## 2021-05-03 MED ORDER — FENTANYL CITRATE (PF) 100 MCG/2ML IJ SOLN
INTRAMUSCULAR | Status: DC | PRN
  Administered 2021-05-03 (×2): 100 ug via INTRAVENOUS

## 2021-05-03 MED ORDER — SODIUM CHLORIDE 0.9% IV SOLUTION: Freq: Once | INTRAVENOUS | Status: DC

## 2021-05-03 MED ORDER — PHENOL 1.4 % MT LIQD: 1.0000 | OROMUCOSAL | Status: DC | PRN

## 2021-05-03 MED ORDER — METOPROLOL TARTRATE 5 MG/5ML IV SOLN
2.0000 mg | INTRAVENOUS | Status: DC | PRN
  Administered 2021-05-13: 5 mg via INTRAVENOUS
  Filled 2021-05-03: qty 5

## 2021-05-03 SURGICAL SUPPLY — 42 items
BAG COUNTER SPONGE SURGICOUNT (BAG) ×4 IMPLANT
BNDG ELASTIC 4X5.8 VLCR STR LF (GAUZE/BANDAGES/DRESSINGS) ×4 IMPLANT
BNDG GAUZE ELAST 4 BULKY (GAUZE/BANDAGES/DRESSINGS) ×4 IMPLANT
CANISTER SUCT 3000ML PPV (MISCELLANEOUS) ×4 IMPLANT
CANNULA VESSEL 3MM 2 BLNT TIP (CANNULA) ×8 IMPLANT
CLIP VESOCCLUDE MED 24/CT (CLIP) ×4 IMPLANT
CLIP VESOCCLUDE SM WIDE 24/CT (CLIP) ×4 IMPLANT
DERMABOND ADVANCED (GAUZE/BANDAGES/DRESSINGS) ×3
DERMABOND ADVANCED .7 DNX12 (GAUZE/BANDAGES/DRESSINGS) ×9 IMPLANT
DRAIN PENROSE 1/4X12 LTX STRL (WOUND CARE) ×4 IMPLANT
DRAPE HALF SHEET 40X57 (DRAPES) ×4 IMPLANT
FILTER SMOKE EVAC ULPA (FILTER) ×4 IMPLANT
GAUZE SPONGE 4X4 12PLY STRL (GAUZE/BANDAGES/DRESSINGS) ×4 IMPLANT
GLOVE SURG POLYISO LF SZ8 (GLOVE) ×4 IMPLANT
GOWN STRL REUS W/ TWL LRG LVL3 (GOWN DISPOSABLE) ×6 IMPLANT
GOWN STRL REUS W/ TWL XL LVL3 (GOWN DISPOSABLE) ×3 IMPLANT
GOWN STRL REUS W/TWL LRG LVL3 (GOWN DISPOSABLE) ×8
GOWN STRL REUS W/TWL XL LVL3 (GOWN DISPOSABLE) ×4
GRAFT PROPATEN W/RING 6X80X60 (Vascular Products) ×4 IMPLANT
HEMOSTAT SNOW SURGICEL 2X4 (HEMOSTASIS) ×4 IMPLANT
KIT BASIN OR (CUSTOM PROCEDURE TRAY) ×4 IMPLANT
KIT TURNOVER KIT B (KITS) ×4 IMPLANT
MARKER GRAFT CORONARY BYPASS (MISCELLANEOUS) ×4 IMPLANT
NS IRRIG 1000ML POUR BTL (IV SOLUTION) ×8 IMPLANT
PACK PERIPHERAL VASCULAR (CUSTOM PROCEDURE TRAY) ×4 IMPLANT
PAD ARMBOARD 7.5X6 YLW CONV (MISCELLANEOUS) ×8 IMPLANT
PENCIL SMOKE EVACUATOR (MISCELLANEOUS) ×4 IMPLANT
SPONGE T-LAP 18X18 ~~LOC~~+RFID (SPONGE) ×4 IMPLANT
STRIP CLOSURE SKIN 1/2X4 (GAUZE/BANDAGES/DRESSINGS) ×12 IMPLANT
SUT MNCRL AB 4-0 PS2 18 (SUTURE) ×8 IMPLANT
SUT PROLENE 5 0 C 1 24 (SUTURE) ×16 IMPLANT
SUT PROLENE 6 0 BV (SUTURE) ×12 IMPLANT
SUT SILK 2 0 SH (SUTURE) ×4 IMPLANT
SUT VIC AB 2-0 CT1 27 (SUTURE) ×12
SUT VIC AB 2-0 CT1 TAPERPNT 27 (SUTURE) ×9 IMPLANT
SUT VIC AB 3-0 SH 27 (SUTURE) ×4
SUT VIC AB 3-0 SH 27X BRD (SUTURE) ×3 IMPLANT
TAPE UMBILICAL COTTON 1/8X30 (MISCELLANEOUS) ×4 IMPLANT
TOWEL GREEN STERILE (TOWEL DISPOSABLE) ×4 IMPLANT
TRAY FOLEY MTR SLVR 16FR STAT (SET/KITS/TRAYS/PACK) ×4 IMPLANT
UNDERPAD 30X36 HEAVY ABSORB (UNDERPADS AND DIAPERS) ×4 IMPLANT
WATER STERILE IRR 1000ML POUR (IV SOLUTION) ×4 IMPLANT

## 2021-05-03 NOTE — Transfer of Care (Signed)
Immediate Anesthesia Transfer of Care Note  Patient: [name]  Procedure(s) Performed: LEFT FEMORAL TO BELOW KNEE POPLITEAL ARTERY BYPASS GRAFTING WITH 6MMX80 PTFE GRAFT (Left: Leg Lower) AMPUTATION OF THIRD LEFT  TOE (Left: Toe)  Patient Location: PACU  Anesthesia Type:General  Level of Consciousness: awake, alert  and oriented  Airway & Oxygen Therapy: Patient Spontanous Breathing and Patient connected to nasal cannula oxygen  Post-op Assessment: Report given to RN and Post -op Vital signs reviewed and stable  Post vital signs: Reviewed and stable  Last Vitals:  Vitals Value Taken Time  BP 164/67 05/03/21 1835  Temp    Pulse 95 05/03/21 1838  Resp 17 05/03/21 1838  SpO2 100 % 05/03/21 1838  Vitals shown include unvalidated device data.  Last Pain:  Vitals:   05/03/21 1225  TempSrc: Oral  PainSc:       Patients Stated Pain Goal: 0 (82/88/33 7445)  Complications: No notable events documented.

## 2021-05-03 NOTE — Progress Notes (Signed)
PROGRESS NOTE    [name]   CHY:[id number]  DOB: [birth year]  PCP: [name], MD    DOA: 04/27/2021 LOS: 6   Assessment & Plan   Principal Problem:   Osteomyelitis of foot, left, acute ([place]) Active Problems:   Essential hypertension   PAD (peripheral artery disease) ([place])   Cellulitis in diabetic foot ([place])   Acute kidney injury superimposed on chronic kidney disease ([place])   Hyponatremia   Anemia, chronic disease   Cellulitis of left lower extremity   Left great toe gangrene Sepsis due to osteomyelitis, cellulitis -sepsis POA.  Sepsis physiology has improved. Status post angiogram on 8/5 showing severe left superficial femoral artery occlusive disease with 3 cm occlusion of the left popliteal artery, 80% stenosis of the distal right external iliac artery above existing femoropopliteal bypass --Continue IV vancomycin and Zosyn, day 6 --Blood cultures negative --Vascular surgery and podiatry were consulted, see their recs --Plan for left femoral to below-knee popliteal bypass today --Toe amputations plan as well --Hold Plavix  Acute kidney injury -due to prerenal azotemia, improved and creatinine now stable.   --Monitor BMP  COVID-19 infection -incidentally positive and asymptomatic. --Monitor clinically --Airborne isolation x10 days  Essential hypertension -continue metoprolol.  HCTZ and myocarditis are on hold.  Type 2 diabetes -uncontrolled with A1c 7.7 in April. --Hold oral meds --Sliding scale NovoLog for now  Chronic iron deficiency anemia -iron panel consistent with severe iron deficiency. -- Consider IV iron infusion postoperatively  CAD status post CABG in 03/2020 -continue aspirin and statin.  Plavix on hold.  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  Continue aspirin and statin.  Plavix on hold.   Patient BMI: Body mass index is 25.52 kg/m.   DVT prophylaxis:   Lovenox held for  surgery   Diet:  Diet Orders (From admission, onward)     Start     Ordered   05/03/21 0001  Diet NPO time specified  Diet effective midnight        05/02/21 1420              Code Status: Full Code   Brief Narrative / Hospital Course to Date:   "[name] is a 80 y.o. female with medical history significant of hypertension, hyperlipidemia, CAD s/p CABG in 2021, PVD s/p bypass grafting, diabetes mellitus type II, ray amputation of right foot presented with left third toe wound.  developed redness pain and streaking going up her left leg associated with fevers and chills . She is s/p right common femoral endarterectomy with femoral popliteal bypass graft on 08/16/2020.  And previously had angioplasty with stent continue to have the left SFA. In the ED she was febrile, mildly tachycardic, WBC was 20.8K, x-ray of the left foot noted osteomyelitis of distal phalanx third toe. -Was also incidentally positive for COVID, is asymptomatic -Vascular following, underwent angiogram on 8/5, noted to have severe left superficial femoral artery occlusive disease, plan for left fem-pop bypass tomorrow. Improving on antibiotics, still with intermittent fevers"  Subjective 05/03/21    Patient seen today at bedside prior to going to her procedure.  She reports being thirsty as she is NPO.  Otherwise is doing well and denies any respiratory symptoms including cough shortness of breath or sore throat.  No fevers or chills so far today.  No other acute complaints.   Disposition Plan & Communication   Status is: Inpatient  Remains inpatient appropriate because:  Further procedures are planned as outlined above  Dispo: The patient is from: Home              Anticipated d/c is to: Home              Patient currently is not medically stable to d/c.   Difficult to place patient No    Consults, Procedures, Significant Events   Consultants:  Vascular surgery Podiatry  Procedures:  Ultrasound  right groin,  abdominal aortogram with bilateral lower extremity runoff  Antimicrobials:  Anti-infectives (From admission, onward)    Start     Dose/Rate Route Frequency Ordered Stop   04/28/21 1800  vancomycin (VANCOREADY) IVPB 750 mg/150 mL        750 mg 150 mL/hr over 60 Minutes Intravenous Every 24 hours 04/28/21 1324     04/28/21 0030  piperacillin-tazobactam (ZOSYN) IVPB 3.375 g        3.375 g 12.5 mL/hr over 240 Minutes Intravenous Every 8 hours 04/27/21 1650     04/27/21 1603  vancomycin variable dose per unstable renal function (pharmacist dosing)  Status:  Discontinued         Does not apply See admin instructions 04/27/21 1604 04/28/21 1324   04/27/21 1545  piperacillin-tazobactam (ZOSYN) IVPB 3.375 g        3.375 g 100 mL/hr over 30 Minutes Intravenous  Once 04/27/21 1532 04/27/21 1707   04/27/21 1545  vancomycin (VANCOREADY) IVPB 1250 mg/250 mL        1,250 mg 166.7 mL/hr over 90 Minutes Intravenous  Once 04/27/21 1532 04/28/21 0305         Micro    Objective   Vitals:   05/03/21 0438 05/03/21 0832 05/03/21 1221 05/03/21 1225  BP: (!) 114/45 (!) 147/43 (!) 149/52   Pulse: 77 95 88   Resp: 14 16 15    Temp: 98.9 F (37.2 C) 98.1 F (36.7 C) 99.1 F (37.3 C)   TempSrc: Oral Oral Oral Oral  SpO2: 95% 99% 95%   Weight:      Height:    5' 4.5" (1.638 m)    Intake/Output Summary (Last 24 hours) at 05/03/2021 1507 Last data filed at 05/03/2021 0440 Gross per 24 hour  Intake --  Output 1 ml  Net -1 ml   Filed Weights   04/27/21 1500  Weight: 68.5 kg    Physical Exam:  General exam: awake, alert, no acute distress HEENT: atraumatic, clear conjunctiva, anicteric sclera, moist mucus membranes, hearing grossly normal  Respiratory system: CTAB, no wheezes, rales or rhonchi, normal respiratory effort. Cardiovascular system: normal S1/S2, RRR, no JVD, murmurs, rubs, gallops, no pedal edema.   Gastrointestinal system: soft, NT, ND, no HSM felt, +bowel  sounds. Central nervous system: A&O x4. no gross focal neurologic deficits, normal speech Extremities: Right foot ray amputation, no edema, normal tone Skin: dry, intact, normal temperaturePsychiatry: normal mood, congruent affect, judgement and insight appear normal  Labs   Data Reviewed: I have personally reviewed following labs and imaging studies  CBC: Recent Labs  Lab 04/27/21 1228 04/28/21 0243 04/28/21 1601 04/29/21 0155 05/01/21 0146 05/02/21 0141 05/03/21 0207  WBC 20.8* 14.4*  --  14.2* 17.0* 17.0* 27.9*  NEUTROABS 17.4*  --   --   --   --   --   --   HGB 10.5* 9.4* 9.9* 9.3* 8.6* 8.4* 9.7*  HCT 32.5* 28.2* 29.0* 28.1* 26.2* 25.0* 29.7*  MCV 89.8 87.6  --  88.4 89.1 86.8 90.3  PLT 238 204  --  215 281 328 383*   Basic Metabolic Panel: Recent Labs  Lab 04/28/21 0243 04/28/21 1601 04/29/21 0155 05/01/21 0146 05/02/21 0141 05/03/21 0207  NA 135 138 133* 136 133* 135  K 3.4* 3.0* 3.1* 3.3* 3.6 3.3*  CL 103 105 104 106 105 103  CO2 23  --  20* 19* 18* 19*  GLUCOSE 102* 106* 95 156* 145* 155*  BUN 35* 25* 26* 23 20 18   CREATININE 1.25* 1.20* 1.24* 1.43* 1.38* 1.28*  CALCIUM 8.6*  --  8.4* 8.2* 8.1* 8.6*  MG  --   --   --  2.0  --   --    GFR: Estimated Creatinine Clearance: 34.3 mL/min (A) (by C-G formula based on SCr of 1.28 mg/dL (H)). Liver Function Tests: No results for input(s): AST, ALT, ALKPHOS, BILITOT, PROT, ALBUMIN in the last 168 hours. No results for input(s): LIPASE, AMYLASE in the last 168 hours. No results for input(s): AMMONIA in the last 168 hours. Coagulation Profile: Recent Labs  Lab 04/27/21 1228  INR 1.1   Cardiac Enzymes: No results for input(s): CKTOTAL, CKMB, CKMBINDEX, TROPONINI in the last 168 hours. BNP (last 3 results) No results for input(s): PROBNP in the last 8760 hours. HbA1C: No results for input(s): HGBA1C in the last 72 hours. CBG: Recent Labs  Lab 05/02/21 1121 05/02/21 1809 05/02/21 2055 05/03/21 0655  05/03/21 1218  GLUCAP 173* 116* 208* 147* 110*   Lipid Profile: No results for input(s): CHOL, HDL, LDLCALC, TRIG, CHOLHDL, LDLDIRECT in the last 72 hours. Thyroid Function Tests: No results for input(s): TSH, T4TOTAL, FREET4, T3FREE, THYROIDAB in the last 72 hours. Anemia Panel: Recent Labs    05/02/21 0141  VITAMINB12 592  FOLATE 11.1  FERRITIN 197  TIBC 179*  IRON 11*  RETICCTPCT 1.2   Sepsis Labs: Recent Labs  Lab 04/27/21 1228 04/27/21 1811  LATICACIDVEN 1.8 1.7    Recent Results (from the past 240 hour(s))  Blood culture (routine x 2)     Status: None   Collection Time: 04/27/21 12:14 PM   Specimen: BLOOD RIGHT HAND  Result Value Ref Range Status   Specimen Description BLOOD RIGHT HAND  Final   Special Requests   Final    BOTTLES DRAWN AEROBIC AND ANAEROBIC Blood Culture adequate volume   Culture   Final    NO GROWTH 5 DAYS Performed at [facility], [place] [address], [place], [place] [address]    Report Status 05/02/2021 FINAL  Final  Blood culture (routine x 2)     Status: None   Collection Time: 04/27/21 12:28 PM   Specimen: BLOOD LEFT ARM  Result Value Ref Range Status   Specimen Description BLOOD LEFT ARM  Final   Special Requests   Final    BOTTLES DRAWN AEROBIC AND ANAEROBIC Blood Culture adequate volume   Culture   Final    NO GROWTH 5 DAYS Performed at [facility], [place] [address], [place], [place] [address]    Report Status 05/02/2021 FINAL  Final  Resp Panel by RT-PCR (Flu A&B, Covid) Nasopharyngeal Swab     Status: Abnormal   Collection Time: 04/27/21  4:40 PM   Specimen: Nasopharyngeal Swab; Nasopharyngeal(NP) swabs in vial transport medium  Result Value Ref Range Status   SARS Coronavirus 2 by RT PCR POSITIVE (A) NEGATIVE Final    Comment: RESULT CALLED TO, READ BACK BY AND VERIFIED WITHJohnette Abraham [facility] RN [phone] 04/27/21 A  BROWNING (NOTE) SARS-CoV-2 target nucleic acids are DETECTED.  The SARS-CoV-2 RNA is generally detectable in  upper respiratory specimens during the acute phase of infection. Positive results are indicative of the presence of the identified virus, but do not rule out bacterial infection or co-infection with other pathogens not detected by the test. Clinical correlation with patient history and other diagnostic information is necessary to determine patient infection status. The expected result is Negative.  Fact Sheet for Patients: EntrepreneurPulse.com.au  Fact Sheet for Healthcare Providers: IncredibleEmployment.be  This test is not yet approved or cleared by the [place] FDA and  has been authorized for detection and/or diagnosis of SARS-CoV-2 by FDA under an Emergency Use Authorization (EUA).  This EUA will remain in effect (meaning this test can  be used) for the duration of  the COVID-19 declaration under Section 564(b)(1) of the Act, 21 U.S.C. section 360bbb-3(b)(1), unless the authorization is terminated or revoked sooner.     Influenza A by PCR NEGATIVE NEGATIVE Final   Influenza B by PCR NEGATIVE NEGATIVE Final    Comment: (NOTE) The Xpert Xpress SARS-CoV-2/FLU/RSV plus assay is intended as an aid in the diagnosis of influenza from Nasopharyngeal swab specimens and should not be used as a sole basis for treatment. Nasal washings and aspirates are unacceptable for Xpert Xpress SARS-CoV-2/FLU/RSV testing.  Fact Sheet for Patients: EntrepreneurPulse.com.au  Fact Sheet for Healthcare Providers: IncredibleEmployment.be  This test is not yet approved or cleared by the [place] FDA and has been authorized for detection and/or diagnosis of SARS-CoV-2 by FDA under an Emergency Use Authorization (EUA). This EUA will remain in effect (meaning this test can be used) for the duration of the COVID-19 declaration under Section 564(b)(1) of the Act, 21 U.S.C. section 360bbb-3(b)(1), unless the authorization is  terminated or revoked.  Performed at [facility], [place] [address] [place], [place] [address]   Surgical PCR screen     Status: Abnormal   Collection Time: 04/27/21  9:31 PM   Specimen: Nasal Mucosa; Nasal Swab  Result Value Ref Range Status   MRSA, PCR NEGATIVE NEGATIVE Final   Staphylococcus aureus POSITIVE (A) NEGATIVE Final    Comment: (NOTE) The Xpert SA Assay (FDA approved for NASAL specimens in patients 42 years of age and older), is one component of a comprehensive surveillance program. It is not intended to diagnose infection nor to guide or monitor treatment. Performed at [facility], [place] [address], [place],  [address]       Imaging Studies   No results found.   Medications   Scheduled Meds:  ascorbic acid  250 mg Oral Q supper   aspirin EC  81 mg Oral QHS   atorvastatin  80 mg Oral Daily   brimonidine  1 drop Right Eye BID   insulin aspart  0-15 Units Subcutaneous TID WC   metoprolol tartrate  37.5 mg Oral BID   pantoprazole  40 mg Oral Once per day on Mon Wed Fri   polyethylene glycol  17 g Oral Daily   pyridoxine  100 mg Oral QPM   saccharomyces boulardii  250 mg Oral BID   sodium chloride flush  3 mL Intravenous Q12H   sodium chloride flush  3 mL Intravenous Q12H   Continuous Infusions:  sodium chloride     piperacillin-tazobactam 3.375 g (05/03/21 0843)   vancomycin 750 mg (05/02/21 2222)       LOS: 6 days    Time spent: 30 minutes  Ezekiel Slocumb, DO [facility]  05/03/2021, 3:07 PM      If 7PM-7AM, please contact night-coverage. How to contact the [name] Attending or Consulting provider [place] or covering provider during after hours [place], for this patient?    Check the care team in [facility] and look for a) attending/consulting [facility] provider listed and b) the [facility] team listed Log into [email or web address] and use [place]'s universal password to access. If you do not have the password, please contact the  hospital operator. Locate the [employer] provider you are looking for under [facility] and page to a number that you can be directly reached. If you still have difficulty reaching the provider, please page the [facility] (Director on Call) for the Hospitalists listed on amion for assistance.

## 2021-05-03 NOTE — Progress Notes (Signed)
[facility] [place] PROGRESS NOTE  ASSESSMENT / PLAN: [name] is a 80 y.o. female with left leg chronic limb threatening ischemia. For left fem-pop bypass and third toe amputation. Today in OR.   SUBJECTIVE: No complaints. Reviewed operative plan.  OBJECTIVE: BP (!) 149/52 (BP Location: Left Arm)   Pulse 88   Temp 99.1 F (37.3 C) (Oral)   Resp 15   Ht 5' 4.5" (1.638 m)   Wt 68.5 kg   SpO2 95%   BMI 25.52 kg/m   Intake/Output Summary (Last 24 hours) at 05/03/2021 1509 Last data filed at 05/03/2021 0440 Gross per 24 hour  Intake --  Output 1 ml  Net -1 ml     No acute distress Unlabored breathing Unchanged appearance of L foot  CBC Latest Ref Rng & Units 05/03/2021 05/02/2021 05/01/2021  WBC 4.0 - 10.5 K/uL 27.9(H) 17.0(H) 17.0(H)  Hemoglobin 12.0 - 15.0 g/dL 9.7(L) 8.4(L) 8.6(L)  Hematocrit 36.0 - 46.0 % 29.7(L) 25.0(L) 26.2(L)  Platelets 150 - 400 K/uL 434(H) 328 281     CMP Latest Ref Rng & Units 05/03/2021 05/02/2021 05/01/2021  Glucose 70 - 99 mg/dL 155(H) 145(H) 156(H)  BUN 8 - 23 mg/dL 18 20 23   Creatinine 0.44 - 1.00 mg/dL 1.28(H) 1.38(H) 1.43(H)  Sodium 135 - 145 mmol/L 135 133(L) 136  Potassium 3.5 - 5.1 mmol/L 3.3(L) 3.6 3.3(L)  Chloride 98 - 111 mmol/L 103 105 106  CO2 22 - 32 mmol/L 19(L) 18(L) 19(L)  Calcium 8.9 - 10.3 mg/dL 8.6(L) 8.1(L) 8.2(L)  Total Protein 6.5 - 8.1 g/dL - - -  Total Bilirubin 0.3 - 1.2 mg/dL - - -  Alkaline Phos 38 - 126 U/L - - -  AST 15 - 41 U/L - - -  ALT 0 - 44 U/L - - -    Estimated Creatinine Clearance: 34.3 mL/min (A) (by C-G formula based on SCr of 1.28 mg/dL (H)).   [name]. Stanford Breed, MD [facility] Phone Number: [phone] 05/03/2021 3:09 PM

## 2021-05-03 NOTE — Op Note (Signed)
DATE OF SERVICE: 05/03/2021  PATIENT:  [name]  80 y.o. 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  ANESTHESIA:   general  EBL: 130mL  BLOOD ADMINISTERED: 1u CC PRBC  DRAINS: none   LOCAL MEDICATIONS USED:  NONE  SPECIMEN:  culture / gram stain from left foot  COUNTS: confirmed correct.  TOURNIQUET:  none  PATIENT DISPOSITION:  PACU - hemodynamically stable.   Delay start of Pharmacological VTE agent (>24hrs) due to surgical blood loss or risk of bleeding: no  INDICATION FOR PROCEDURE: [name] is a 80 y.o. female with dry gangrene of left third toe with angiographic evidence of severe peripheral arterial disease. After careful discussion of risks, benefits, and alternatives the patient was offered femoral - TP trunk bypass. We specifically discussed risk of bypass failure. The patient understood and wished to proceed.  OPERATIVE FINDINGS: Exposure of the common femoral artery and its bifurcation via oblique incision.  Left below-knee pop / anterior tibial artery / tibioperoneal trunk exposed via the medial, proximal calf incision.  Both targets appeared appropriate for bypass.  A 6 mm ePTFE conduit was tunneled in anatomic position to connect the 2 exposures.  Upon completion the patient had a brisk Doppler signal in the anterior tibial artery at the foot.  The foot was hyperemic and pink.  Healthy bleeding tissue was encountered at the toe amputation margin.  Gross purulence was encountered at the time of rotation.  The toe amputation was left open.  DESCRIPTION  OF PROCEDURE: After identification of the patient in the pre-operative holding area, the patient was transferred to the operating room. The patient was positioned supine on the operating room table. Anesthesia was induced. The left leg was prepped and draped in standard fashion. A surgical pause was performed confirming correct patient, procedure, and operative location.  An oblique incision was made in the left groin to expose the common femoral artery.  This is carried down through subtenons tissue until the femoral sheath was encountered.  This was incised sharply.  The femoral arteries were encountered and skeletonized sharply.  The below-knee popliteal artery was exposed via a proximal medial left calf incision.  Incision was carried down through subtenons tissue until the superficial posterior fascia was encountered and divided.  The gastrocnemius was swept posteriorly.  The popliteal vascular bundle was identified.  Exposure was carried down on the popliteal artery to the level of the soleus.  The soleus was partially divided to allow exposure of the takeoff of the anterior tibial artery and the tibioperoneal trunk.  Crossing vein over the tibioperoneal trunk was divided and excellent exposure was achieved.  Sheath tunneling device was brought onto the field and used to create an anatomic tunnel connecting the 2 exposures.  A 6 mm externally supported E PTFE vascular graft was delivered through the sheath.  The patient was systemically heparinized.  Activated clotting time measurements were used throughout the case.    The femoral vessels were clamped.  An anterior arteriotomy was made using an 11 blade and extended with Potts scissors.  The  proximal end of the vascular graft was spatulated to allow end-to-side anastomosis to the arteriotomy.  This was performed using continuous running suture of 6-0 Prolene.  After completion the anastomosis was flushed down the open end of the graft.  Clamp was  applied to the anastomosis.  The leg was extended and the vascular graft cut and spatulated to allow tension and redundant free anastomosis with the tibioperoneal trunk.  The tibioperoneal trunk, anterior tibial artery, and below-knee popliteal artery were clamped.  An anterior medial arteriotomy was made with 11 blade from the popliteal artery, extending across the origin of the anterior tibial artery, and onto the tibioperoneal trunk.  The artery appeared adequate for anastomosis.  The graft was anastomosed end to side to the arteriotomy using continuous running suture of 6-0 Prolene.  Immediately prior to completion the anastomosis was flushed and de-aired.  The anastomosis was completed.  Clamps were released on the graft and arteries.  Hemostasis was achieved in the anastomoses.  The native arteries were interrogated with Doppler machine.  Brisk Doppler flow was noted immediately distal to the anastomosis.  Brisk Doppler flow was noted in the anterior tibial artery at the ankle.  The signals obliterated with occlusion of the bypass.  Hemostasis was achieved in the surgical beds and anastomoses.  The wounds were closed in layers using 2-0 Vicryl, 3-0 Vicryl, 4-0 Monocryl.  Dermabond was applied.   Satisfied with our revascularization we turned our attention to the toe. A tennis racquet style incision was made around the third toe.  Incision was carried down through skin and subcutaneous tissue.  Gross purulence was encountered.  The toe was removed in standard fashion.  The metatarsal was debrided with a rongeur.  Cultures were taken of the purulence.  The wound was debrided to healthy margins.  The wound was packed with a saline moistened 4 x 4.  A clean bandage was applied to the foot.  Upon completion of the case instrument and sharps counts were confirmed correct. The patient was transferred to the PACU in good condition. I was present for all portions of the procedure.  [name]. Stanford Breed,  MD [facility] Phone Number: [phone] 05/03/2021 5:58 PM

## 2021-05-03 NOTE — Anesthesia Procedure Notes (Signed)
Arterial Line Insertion Start/End8/06/2021 3:12 PM, 05/03/2021 3:33 PM Performed by: Nolon Nations, MD, anesthesiologist  Patient location: Pre-op. Preanesthetic checklist: patient identified, IV checked, site marked, risks and benefits discussed, surgical consent, monitors and equipment checked, pre-op evaluation, timeout performed and anesthesia consent Lidocaine 1% used for infiltration Left, radial was placed Catheter size: 20 G Hand hygiene performed  and maximum sterile barriers used   Attempts: 2 Procedure performed using ultrasound guided technique. Ultrasound Notes:anatomy identified, needle tip was noted to be adjacent to the nerve/plexus identified, no ultrasound evidence of intravascular and/or intraneural injection and image(s) printed for medical record Following insertion, dressing applied and Biopatch. Post procedure assessment: normal and unchanged  Patient tolerated the procedure with difficulty.

## 2021-05-03 NOTE — Anesthesia Postprocedure Evaluation (Signed)
Anesthesia Post Note  Patient: Zeena H Welby  Procedure(s) Performed: LEFT FEMORAL TO BELOW KNEE POPLITEAL ARTERY BYPASS GRAFTING WITH [id number] PTFE GRAFT (Left: Leg Lower) AMPUTATION OF THIRD LEFT  TOE (Left: Toe)     Patient location during evaluation: PACU Anesthesia Type: General Level of consciousness: sedated Pain management: pain level controlled Vital Signs Assessment: post-procedure vital signs reviewed and stable Respiratory status: spontaneous breathing and respiratory function stable Cardiovascular status: stable Postop Assessment: no apparent nausea or vomiting Anesthetic complications: no   No notable events documented.  Last Vitals:  Vitals:   05/03/21 1957 05/03/21 2012  BP: (!) 154/56 (!) 159/57  Pulse:  82  Resp:  19  Temp:    SpO2: 98% 100%    Last Pain:  Vitals:   05/03/21 1925  TempSrc:   PainSc: 3                  Zaydn Gutridge DANIEL

## 2021-05-03 NOTE — Progress Notes (Signed)
Pharmacy Antibiotic Note  [name] is a 80 y.o. female admitted on 04/27/2021 with  osteomyelitis .  Pharmacy has been consulted for vancomycin dosing; also on Zosyn. Patient stubbed her toe on furniture on 8/1 and was seen by her PCP 8/4 who sent her here as her toe is necrotic.   Planning for left femoral to below-knee popliteal bypass and toe amputation on 8/10.  -SCr= 1.2 (trend down), WBC 27.9 (up)   Plan: Continue Vancomycin 750 mg IV q24h Goal AUC 400-550. Continue Zosyn 3.375 gm IV Q 8 hours  Monitor renal function, clinical status, and abx plan.  Weight: 68.5 kg (151 lb 0.2 oz)  Temp (24hrs), Avg:98.2 F (36.8 C), Min:97.9 F (36.6 C), Max:98.9 F (37.2 C)  Recent Labs  Lab 04/27/21 1228 04/27/21 1811 04/28/21 0243 04/28/21 1601 04/29/21 0155 04/30/21 1742 04/30/21 2141 05/01/21 0146 05/02/21 0141 05/03/21 0207  WBC 20.8*  --  14.4*  --  14.2*  --   --  17.0* 17.0* 27.9*  CREATININE 1.41*  --  1.25* 1.20* 1.24*  --   --  1.43* 1.38* 1.28*  LATICACIDVEN 1.8 1.7  --   --   --   --   --   --   --   --   VANCOTROUGH  --   --   --   --   --  14*  --   --   --   --   VANCOPEAK  --   --   --   --   --   --  33  --   --   --      Estimated Creatinine Clearance: 34.3 mL/min (A) (by C-G formula based on SCr of 1.28 mg/dL (H)).    Allergies  Allergen Reactions   Bactrim [Sulfamethoxazole-Trimethoprim] Other (See Comments)    ^ K+( elevated)    Demerol [Meperidine]     Delusional    Scopolamine     Delusional    Tramadol Other (See Comments)    Keeps awake   Versed [Midazolam] Anxiety    Frantic, out of my mind, agitated     Antimicrobials this admission: Zosyn 8/4 >> Vancomycin 8/4 >>  Dose adjustments this admission:   Microbiology results: 8/4 BCx: neg   Thank you for involving pharmacy in this patient's care.  Hildred Laser, PharmD Clinical Pharmacist **Pharmacist phone directory can now be found on [email or web address] ([email or web address]).  Listed under [place].

## 2021-05-03 NOTE — Anesthesia Preprocedure Evaluation (Addendum)
Anesthesia Evaluation  Patient identified by MRN, date of birth, ID band Patient awake    Reviewed: Allergy & Precautions, NPO status , Patient's Chart, lab work & pertinent test results  History of Anesthesia Complications (+) history of anesthetic complications  Airway Mallampati: II  TM Distance: >3 FB Neck ROM: Full    Dental  (+) Dental Advisory Given, Teeth Intact   Pulmonary pneumonia, resolved, former smoker,  Quit smoking 1973   Pulmonary exam normal breath sounds clear to auscultation       Cardiovascular hypertension, Pt. on medications and Pt. on home beta blockers + CAD, + Past MI, + CABG and + Peripheral Vascular Disease  Normal cardiovascular exam+ dysrhythmias + Valvular Problems/Murmurs  Rhythm:Regular Rate:Normal  Echo 02/2020 1. Left ventricular ejection fraction, by estimation, is 60 to 65%. The left ventricle has normal function. The left ventricle has no regional wall motion abnormalities. Left ventricular diastolic parameters are consistent with Grade I diastolic dysfunction (impaired relaxation).  2. Right ventricular systolic function is normal. The right ventricular size is normal. There is normal pulmonary artery systolic pressure.  3. Left atrial size was mildly dilated.  4. The mitral valve is normal in structure. Trivial mitral valve regurgitation. No evidence of mitral stenosis.  5. The aortic valve is normal in structure. Aortic valve regurgitation is not visualized. No aortic stenosis is present.  6. The inferior vena cava is normal in size with greater than 50% respiratory variability, suggesting right atrial pressure of 3 mmHg.    Neuro/Psych  Neuromuscular disease negative psych ROS   GI/Hepatic Neg liver ROS, hiatal hernia, GERD  Controlled and Medicated,  Endo/Other  diabetes, Well Controlled, Type 2, Oral Hypoglycemic AgentsHyperlipidemia  Renal/GU Renal disease  negative  genitourinary   Musculoskeletal  (+) Arthritis , Osteoarthritis,    Abdominal   Peds  Hematology negative hematology ROS (+)   Anesthesia Other Findings Bad reaction to versed  Reproductive/Obstetrics negative OB ROS                           Anesthesia Physical  Anesthesia Plan  ASA: 3  Anesthesia Plan: General   Post-op Pain Management:    Induction: Intravenous  PONV Risk Score and Plan: 4 or greater and Treatment may vary due to age or medical condition, Ondansetron, Dexamethasone and Diphenhydramine  Airway Management Planned: Oral ETT  Additional Equipment: Arterial line  Intra-op Plan:   Post-operative Plan: Extubation in OR  Informed Consent: I have reviewed the patients History and Physical, chart, labs and discussed the procedure including the risks, benefits and alternatives for the proposed anesthesia with the patient or authorized representative who has indicated his/her understanding and acceptance.     Dental advisory given  Plan Discussed with: CRNA  Anesthesia Plan Comments: (2 x PIV  PAT note by [name], PA-C: Follows with cardiology for history of CAD status post recent non-STEMI June 2021, ultimately she underwent CABG x5 by Dr. Orvan Seen on 03/24/2020.  Per follow-up notes she has done very well with this, is participating in cardiac rehab remotely at home via an app.  She is also followed by Dr. Gwenlyn Found for history of PAD.  She has a history of right common femoral endarterectomy patch angioplasty 07/01/2019 by Dr. Trula Slade.  She has also had orbital atherectomy and drug-coated balloon angioplasty of the right SFA, popliteal and tibioperoneal trunk on 08/03/2019 by Dr. Gwenlyn Found.  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 Earleen Newport  08/07/2019.  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  Dr. Gwenlyn Found performed peripheral angiography on 08/01/2020 which showed high-grade right common femoral artery stenosis, occluded right  SFA at the origin reconstituting and adductor canal with one-vessel runoff via the peroneal artery.  It was determined she would need femoral endarterectomy and right femoral-popliteal bypass grafting.  Dr. Donzetta Matters was consulted with plan for expedited surgical revascularization for limb salvage.  Per Dr. Donzetta Matters, patient will stay on Plavix and aspirin perioperatively.  Preop labs reviewed, unremarkable.  DM2 reasonably well controlled with A1c 7.1.  TTE 03/23/20: 1. Left ventricular ejection fraction, by estimation, is 60 to 65%. The  left ventricle has normal function. The left ventricle has no regional  wall motion abnormalities. Left ventricular diastolic parameters are  consistent with Grade I diastolic  dysfunction (impaired relaxation).  2. Right ventricular systolic function is normal. The right ventricular  size is normal. There is normal pulmonary artery systolic pressure.  3. Left atrial size was mildly dilated.  4. The mitral valve is normal in structure. Trivial mitral valve  regurgitation. No evidence of mitral stenosis.  5. The aortic valve is normal in structure. Aortic valve regurgitation is  not visualized. No aortic stenosis is present.  6. The inferior vena cava is normal in size with greater than 50%  respiratory variability, suggesting right atrial pressure of 3 mmHg.   Spirometry 03/23/2020: FVC-Pre Latest Units: L 2.30 FVC-%Pred-Pre Latest Units: % 83 FEV1-Pre Latest Units: L 1.80 FEV1-%Pred-Pre Latest Units: % 87 Pre FEV1/FVC ratio Latest Units: % 78 FEV1FVC-%Pred-Pre Latest Units: % 105 FEF 25-75 Pre Latest Units: L/sec 1.60 FEF2575-%Pred-Pre Latest Units: % 104 FEV6-Pre Latest Units: L 2.29 FEV6-%Pred-Pre Latest Units: % 87 Pre FEV6/FVC Ratio Latest Units: % 100 FEV6FVC-%Pred-Pre Latest Units: % 105  )     Anesthesia Quick Evaluation

## 2021-05-04 ENCOUNTER — Encounter (HOSPITAL_COMMUNITY): Payer: Self-pay | Admitting: Vascular Surgery

## 2021-05-04 ENCOUNTER — Other Ambulatory Visit (HOSPITAL_COMMUNITY): Payer: Self-pay

## 2021-05-04 DIAGNOSIS — M86172 Other acute osteomyelitis, left ankle and foot: Secondary | ICD-10-CM | POA: Diagnosis not present

## 2021-05-04 LAB — BASIC METABOLIC PANEL
Anion gap: 8 (ref 5–15)
BUN: 25 mg/dL — ABNORMAL HIGH (ref 8–23)
CO2: 19 mmol/L — ABNORMAL LOW (ref 22–32)
Calcium: 7.7 mg/dL — ABNORMAL LOW (ref 8.9–10.3)
Chloride: 109 mmol/L (ref 98–111)
Creatinine, Ser: 1.4 mg/dL — ABNORMAL HIGH (ref 0.44–1.00)
GFR, Estimated: 38 mL/min — ABNORMAL LOW (ref >60)
Glucose, Bld: 222 mg/dL — ABNORMAL HIGH (ref 70–99)
Potassium: 4.4 mmol/L (ref 3.5–5.1)
Sodium: 136 mmol/L (ref 135–145)

## 2021-05-04 LAB — POCT I-STAT, CHEM 8
BUN: 16 mg/dL (ref 8–23)
Calcium, Ion: 1.23 mmol/L (ref 1.15–1.40)
Chloride: 107 mmol/L (ref 98–111)
Creatinine, Ser: 0.9 mg/dL (ref 0.44–1.00)
Glucose, Bld: 114 mg/dL — ABNORMAL HIGH (ref 70–99)
HCT: 23 % — ABNORMAL LOW (ref 36.0–46.0)
Hemoglobin: 7.8 g/dL — ABNORMAL LOW (ref 12.0–15.0)
Potassium: 3.6 mmol/L (ref 3.5–5.1)
Sodium: 137 mmol/L (ref 135–145)
TCO2: 23 mmol/L (ref 22–32)

## 2021-05-04 LAB — GLUCOSE, CAPILLARY
Glucose-Capillary: 202 mg/dL — ABNORMAL HIGH (ref 70–99)
Glucose-Capillary: 253 mg/dL — ABNORMAL HIGH (ref 70–99)
Glucose-Capillary: 226 mg/dL — ABNORMAL HIGH (ref 70–99)
Glucose-Capillary: 280 mg/dL — ABNORMAL HIGH (ref 70–99)

## 2021-05-04 LAB — CBC
HCT: 20.8 % — ABNORMAL LOW (ref 36.0–46.0)
Hemoglobin: 6.9 g/dL — CL (ref 12.0–15.0)
MCH: 29.7 pg (ref 26.0–34.0)
MCHC: 33.2 g/dL (ref 30.0–36.0)
MCV: 89.7 fL (ref 80.0–100.0)
Platelets: 312 10*3/uL (ref 150–400)
RBC: 2.32 MIL/uL — ABNORMAL LOW (ref 3.87–5.11)
RDW: 14.2 % (ref 11.5–15.5)
WBC: 21.1 10*3/uL — ABNORMAL HIGH (ref 4.0–10.5)
nRBC: 0 % (ref 0.0–0.2)

## 2021-05-04 LAB — HEPARIN LEVEL (UNFRACTIONATED): Heparin Unfractionated: <0.1 IU/mL — ABNORMAL LOW (ref 0.30–0.70)

## 2021-05-04 LAB — POCT ACTIVATED CLOTTING TIME
Activated Clotting Time: 178 seconds
Activated Clotting Time: 219 seconds

## 2021-05-04 LAB — HEMOGLOBIN AND HEMATOCRIT, BLOOD
HCT: 28 % — ABNORMAL LOW (ref 36.0–46.0)
Hemoglobin: 9.3 g/dL — ABNORMAL LOW (ref 12.0–15.0)

## 2021-05-04 MED ORDER — HEPARIN (PORCINE) 25000 UT/250ML-% IV SOLN
1700.0000 [IU]/h | INTRAVENOUS | Status: AC
  Administered 2021-05-04: 1000 [IU]/h via INTRAVENOUS
  Administered 2021-05-08 (×2): 1750 [IU]/h via INTRAVENOUS
  Administered 2021-05-09 – 2021-05-13 (×7): 1800 [IU]/h via INTRAVENOUS
  Administered 2021-05-14: 1700 [IU]/h via INTRAVENOUS
  Filled 2021-05-04 (×16): qty 250

## 2021-05-04 NOTE — Progress Notes (Signed)
Orthopedic Tech Progress Note Patient Details:  [name] 1941/08/11 [id number]  Ortho Devices Type of Ortho Device: Darco shoe Ortho Device/Splint Location: LE Ortho Device/Splint Interventions: Ordered      Danton Sewer A Jamiee Milholland 05/04/2021, 3:23 PM

## 2021-05-04 NOTE — Progress Notes (Signed)
PROGRESS NOTE    [name]   BWG:[id number]  DOB: April 22, 1941  PCP: [name], MD    DOA: 04/27/2021 LOS: 7   Assessment & Plan   Principal Problem:   Osteomyelitis of foot, left, acute ([place]) Active Problems:   Essential hypertension   PAD (peripheral artery disease) ([place])   Cellulitis in diabetic foot ([place])   Acute kidney injury superimposed on chronic kidney disease ([place])   Hyponatremia   Anemia, chronic disease   Cellulitis of left lower extremity   Left great toe gangrene Sepsis due to osteomyelitis, cellulitis -sepsis POA.  Sepsis physiology has improved. Status post angiogram on 8/5 showing severe left superficial femoral artery occlusive disease with 3 cm occlusion of the left popliteal artery, 80% stenosis of the distal right external iliac artery above existing femoropopliteal bypass --Continue IV vancomycin - day 7 --Stop Zosyn given cultures with GPC's (8/11) --Blood cultures negative --Vascular surgery and podiatry are consulted Vascular Recs: Wet-to-dry dressing changes BID starting tomorrow. Will transition to [facility] as soon as infection cleared. Cultures pending. Continue antibiotics x 5 days. PT / OT / OOB / Ambulate with heel weightbearing --Hold Plavix until okay from surgical standpoint  Acute kidney injury -due to prerenal azotemia, improved and creatinine now stable.   --Monitor BMP  COVID-19 infection -incidentally positive and asymptomatic. --Monitor clinically --Airborne isolation x10 days  Essential hypertension -continue metoprolol.  HCTZ and myocarditis are on hold.  Type 2 diabetes -uncontrolled with A1c 7.7 in April. --Hold oral meds --Sliding scale NovoLog for now  Chronic iron deficiency anemia -iron panel consistent with severe iron deficiency. -- Consider IV iron infusion postoperatively  CAD status post CABG in 03/2020 -continue aspirin and statin.  Plavix on hold.  Peripheral vascular disease -status post right  foot ray amputation back in November 2020.  Continue aspirin and statin.  Plavix on hold.   Patient BMI: Body mass index is 25.52 kg/m.   DVT prophylaxis: SCD's Start: 05/03/21 2005  Lovenox held for surgery   Diet:  Diet Orders (From admission, onward)     Start     Ordered   05/03/21 2300  Diet regular Room service appropriate? Yes; Fluid consistency: Thin  Diet effective now       Question Answer Comment  Room service appropriate? Yes   Fluid consistency: Thin      05/03/21 2302              Code Status: Full Code   Brief Narrative / Hospital Course to Date:   "[name] is a 80 y.o. female with medical history significant of hypertension, hyperlipidemia, CAD s/p CABG in 2021, PVD s/p bypass grafting, diabetes mellitus type II, ray amputation of right foot presented with left third toe wound.  developed redness pain and streaking going up her left leg associated with fevers and chills . She is s/p right common femoral endarterectomy with femoral popliteal bypass graft on 08/16/2020.  And previously had angioplasty with stent continue to have the left SFA. In the ED she was febrile, mildly tachycardic, WBC was 20.8K, x-ray of the left foot noted osteomyelitis of distal phalanx third toe. -Was also incidentally positive for COVID, is asymptomatic -Vascular following, underwent angiogram on 8/5, noted to have severe left superficial femoral artery occlusive disease, plan for left fem-pop bypass tomorrow. Improving on antibiotics, still with intermittent fevers"  Subjective 05/04/21    Patient seen today while working with therapy.  She had just transferred to the  bedside commode and having signficant pain at her groin incision.  Otherwise reports she's been feeling overall well.  Denies any respiratory or other covid symptoms, says never had any.     Disposition Plan & Communication   Status is: Inpatient  Remains inpatient appropriate because: Further procedures are  planned as outlined above  Dispo: The patient is from: Home              Anticipated d/c is to: Home              Patient currently is not medically stable to d/c.   Difficult to place patient No    Consults, Procedures, Significant Events   Consultants:  Vascular surgery Podiatry  Procedures:  8/5 - Ultrasound right groin 8/5 - Abdominal aortogram with bilateral lower extremity runoff 8/10 - left common femoral - tibioperoneal trunk bypass with 10mm ringed ePTFE in anatomic tunnel 8/10 - left third toe open amputation  Antimicrobials:  Anti-infectives (From admission, onward)    Start     Dose/Rate Route Frequency Ordered Stop   04/28/21 1800  vancomycin (VANCOREADY) IVPB 750 mg/150 mL        750 mg 150 mL/hr over 60 Minutes Intravenous Every 24 hours 04/28/21 1324     04/28/21 0030  piperacillin-tazobactam (ZOSYN) IVPB 3.375 g  Status:  Discontinued        3.375 g 12.5 mL/hr over 240 Minutes Intravenous Every 8 hours 04/27/21 1650 05/04/21 1358   04/27/21 1603  vancomycin variable dose per unstable renal function (pharmacist dosing)  Status:  Discontinued         Does not apply See admin instructions 04/27/21 1604 04/28/21 1324   04/27/21 1545  piperacillin-tazobactam (ZOSYN) IVPB 3.375 g        3.375 g 100 mL/hr over 30 Minutes Intravenous  Once 04/27/21 1532 04/27/21 1707   04/27/21 1545  vancomycin (VANCOREADY) IVPB 1250 mg/250 mL        1,250 mg 166.7 mL/hr over 90 Minutes Intravenous  Once 04/27/21 1532 04/28/21 0305         Micro    Objective   Vitals:   05/04/21 0657 05/04/21 0939 05/04/21 1013 05/04/21 1735  BP: (!) 129/45 (!) 152/44  (!) 123/46  Pulse: 68 87  62  Resp: 15 18  16   Temp: 97.6 F (36.4 C) 97.7 F (36.5 C) 97.6 F (36.4 C) (!) 97.5 F (36.4 C)  TempSrc: Oral Axillary Oral Oral  SpO2: 99% 99%  100%  Weight:      Height:        Intake/Output Summary (Last 24 hours) at 05/04/2021 1843 Last data filed at 05/04/2021 1700 Gross per 24  hour  Intake 2985.65 ml  Output 650 ml  Net 2335.65 ml   Filed Weights   04/27/21 1500  Weight: 68.5 kg    Physical Exam:  General exam: awake, alert, no mild distress due to pain Respiratory system: normal respiratory effort, on room air. Cardiovascular system: RRR, no pedal edema.   Central nervous system: A&O x4. no gross focal neurologic deficits, normal speech Skin: left groin incision and left medial lower leg incision are well approximated and without drainage or surrounding erythema or swelling Psychiatry: normal mood, congruent affect, judgement and insight appear normal  Labs   Data Reviewed: I have personally reviewed following labs and imaging studies  CBC: Recent Labs  Lab 04/29/21 0155 05/01/21 0146 05/02/21 0141 05/03/21 0207 05/03/21 1554 05/04/21 0443 05/04/21 1407  WBC 14.2*  17.0* 17.0* 27.9*  --  21.1*  --   HGB 9.3* 8.6* 8.4* 9.7* 7.8* 6.9* 9.3*  HCT 28.1* 26.2* 25.0* 29.7* 23.0* 20.8* 28.0*  MCV 88.4 89.1 86.8 90.3  --  89.7  --   PLT 215 281 328 434*  --  312  --    Basic Metabolic Panel: Recent Labs  Lab 04/29/21 0155 05/01/21 0146 05/02/21 0141 05/03/21 0207 05/03/21 1554 05/04/21 0443  NA 133* 136 133* 135 137 136  K 3.1* 3.3* 3.6 3.3* 3.6 4.4  CL 104 106 105 103 107 109  CO2 20* 19* 18* 19*  --  19*  GLUCOSE 95 156* 145* 155* 114* 222*  BUN 26* 23 20 18 16  25*  CREATININE 1.24* 1.43* 1.38* 1.28* 0.90 1.40*  CALCIUM 8.4* 8.2* 8.1* 8.6*  --  7.7*  MG  --  2.0  --   --   --   --    GFR: Estimated Creatinine Clearance: 31.3 mL/min (A) (by C-G formula based on SCr of 1.4 mg/dL (H)). Liver Function Tests: No results for input(s): AST, ALT, ALKPHOS, BILITOT, PROT, ALBUMIN in the last 168 hours. No results for input(s): LIPASE, AMYLASE in the last 168 hours. No results for input(s): AMMONIA in the last 168 hours. Coagulation Profile: No results for input(s): INR, PROTIME in the last 168 hours.  Cardiac Enzymes: No results for  input(s): CKTOTAL, CKMB, CKMBINDEX, TROPONINI in the last 168 hours. BNP (last 3 results) No results for input(s): PROBNP in the last 8760 hours. HbA1C: No results for input(s): HGBA1C in the last 72 hours. CBG: Recent Labs  Lab 05/03/21 1848 05/03/21 2118 05/04/21 0530 05/04/21 1317 05/04/21 1731  GLUCAP 139* 152* 202* 253* 226*   Lipid Profile: No results for input(s): CHOL, HDL, LDLCALC, TRIG, CHOLHDL, LDLDIRECT in the last 72 hours. Thyroid Function Tests: No results for input(s): TSH, T4TOTAL, FREET4, T3FREE, THYROIDAB in the last 72 hours. Anemia Panel: Recent Labs    05/02/21 0141  VITAMINB12 592  FOLATE 11.1  FERRITIN 197  TIBC 179*  IRON 11*  RETICCTPCT 1.2   Sepsis Labs: No results for input(s): PROCALCITON, LATICACIDVEN in the last 168 hours.   Recent Results (from the past 240 hour(s))  Blood culture (routine x 2)     Status: None   Collection Time: 04/27/21 12:14 PM   Specimen: BLOOD RIGHT HAND  Result Value [place] Status   Specimen Description BLOOD RIGHT HAND  Final   Special Requests   Final    BOTTLES DRAWN AEROBIC AND ANAEROBIC Blood Culture adequate volume   Culture   Final    NO GROWTH 5 DAYS Performed at [facility], [address] [place], [place] [address]    Report Status 05/02/2021 FINAL  Final  Blood culture (routine x 2)     Status: None   Collection Time: 04/27/21 12:28 PM   Specimen: BLOOD LEFT ARM  Result Value [place] Status   Specimen Description BLOOD LEFT ARM  Final   Special Requests   Final    BOTTLES DRAWN AEROBIC AND ANAEROBIC Blood Culture adequate volume   Culture   Final    NO GROWTH 5 DAYS Performed at [facility], [place] [address], [place], [place] [address]    Report Status 05/02/2021 FINAL  Final  Resp Panel by RT-PCR (Flu A&B, Covid) Nasopharyngeal Swab     Status: Abnormal   Collection Time: 04/27/21  4:40 PM   Specimen: Nasopharyngeal Swab;  Nasopharyngeal(NP) swabs in vial transport medium   Result Value [place] Status   SARS Coronavirus 2 by RT PCR POSITIVE (A) NEGATIVE Final    Comment: RESULT CALLED TO, READ BACK BY AND VERIFIED WITHTish Men RN 1806 04/27/21 A BROWNING (NOTE) SARS-CoV-2 target nucleic acids are DETECTED.  The SARS-CoV-2 RNA is generally detectable in upper respiratory specimens during the acute phase of infection. Positive results are indicative of the presence of the identified virus, but do not rule out bacterial infection or co-infection with other pathogens not detected by the test. Clinical correlation with patient history and other diagnostic information is necessary to determine patient infection status. The expected result is Negative.  Fact Sheet for Patients: EntrepreneurPulse.com.au  Fact Sheet for Healthcare Providers: IncredibleEmployment.be  This test is not yet approved or cleared by the [place] FDA and  has been authorized for detection and/or diagnosis of SARS-CoV-2 by FDA under an Emergency Use Authorization (EUA).  This EUA will remain in effect (meaning this test can  be used) for the duration of  the COVID-19 declaration under Section 564(b)(1) of the Act, 21 U.S.C. section 360bbb-3(b)(1), unless the authorization is terminated or revoked sooner.     Influenza A by PCR NEGATIVE NEGATIVE Final   Influenza B by PCR NEGATIVE NEGATIVE Final    Comment: (NOTE) The Xpert Xpress SARS-CoV-2/FLU/RSV plus assay is intended as an aid in the diagnosis of influenza from Nasopharyngeal swab specimens and should not be used as a sole basis for treatment. Nasal washings and aspirates are unacceptable for Xpert Xpress SARS-CoV-2/FLU/RSV testing.  Fact Sheet for Patients: EntrepreneurPulse.com.au  Fact Sheet for Healthcare Providers: IncredibleEmployment.be  This test is not yet approved or cleared by the [place] FDA and has been authorized for  detection and/or diagnosis of SARS-CoV-2 by FDA under an Emergency Use Authorization (EUA). This EUA will remain in effect (meaning this test can be used) for the duration of the COVID-19 declaration under Section 564(b)(1) of the Act, 21 U.S.C. section 360bbb-3(b)(1), unless the authorization is terminated or revoked.  Performed at [facility], [place] [address] [place], [place] [address]   Surgical PCR screen     Status: Abnormal   Collection Time: 04/27/21  9:31 PM   Specimen: Nasal Mucosa; Nasal Swab  Result Value [place] Status   MRSA, PCR NEGATIVE NEGATIVE Final   Staphylococcus aureus POSITIVE (A) NEGATIVE Final    Comment: (NOTE) The Xpert SA Assay (FDA approved for NASAL specimens in patients 20 years of age and older), is one component of a comprehensive surveillance program. It is not intended to diagnose infection nor to guide or monitor treatment. Performed at [facility], [place] [address], [place], [place] [address]   Aerobic/Anaerobic Culture w Gram Stain (surgical/deep wound)     Status: None (Preliminary result)   Collection Time: 05/03/21  5:42 PM   Specimen: PATH Other; Tissue  Result Value [place] Status   Specimen Description WOUND  Final   Special Requests LEFT THIRD TOE SWABS FOR CULTURE SPEC A  Final   Gram Stain   Final    RARE WBC PRESENT,BOTH PMN AND MONONUCLEAR RARE GRAM POSITIVE COCCI Performed at [facility], [place] [address] [place],  [address]    Culture PENDING  Incomplete   Report Status PENDING  Incomplete      Imaging Studies   No results found.   Medications   Scheduled Meds:  ascorbic acid  250 mg  Oral Q supper   aspirin EC  81 mg Oral QHS   atorvastatin  80 mg Oral Daily   brimonidine  1 drop Right Eye BID   Chlorhexidine Gluconate Cloth  6 each Topical Daily   docusate sodium  100 mg Oral Daily   insulin aspart  0-15 Units Subcutaneous TID WC   metoprolol tartrate  37.5 mg Oral BID    pantoprazole  40 mg Oral Once per day on Mon Wed Fri   polyethylene glycol  17 g Oral Daily   pyridoxine  100 mg Oral QPM   saccharomyces boulardii  250 mg Oral BID   sodium chloride flush  3 mL Intravenous Q12H   Continuous Infusions:  sodium chloride     sodium chloride 50 mL/hr at 05/04/21 0923   heparin 1,250 Units/hr (05/04/21 1836)   magnesium sulfate bolus IVPB     vancomycin Stopped (05/03/21 2310)       LOS: 7 days    Time spent: 25 minutes with > 50% spent at bedside and in coordination of care     Ezekiel Slocumb, DO [facility]  05/04/2021, 6:43 PM      If 7PM-7AM, please contact night-coverage. How to contact the [facility] Attending or Consulting provider [place] or covering provider during after hours [place], for this patient?    Check the care team in [facility] and look for a) attending/consulting [facility] provider listed and b) the [facility] team listed Log into [email or web address] and use [place]'s universal password to access. If you do not have the password, please contact the hospital operator. Locate the [facility] provider you are looking for under [facility] and page to a number that you can be directly reached. If you still have difficulty reaching the provider, please page the [facility] (Director on Call) for the Hospitalists listed on amion for assistance.

## 2021-05-04 NOTE — Progress Notes (Signed)
ANTICOAGULATION CONSULT NOTE - Initial Consult  Pharmacy Consult for heparin Indication: L common femoral-tibioperoneal trunk bypass   Allergies  Allergen Reactions   Bactrim [Sulfamethoxazole-Trimethoprim] Other (See Comments)    ^ K+( elevated)    Demerol [Meperidine]     Delusional    Scopolamine     Delusional    Tramadol Other (See Comments)    Keeps awake   Versed [Midazolam] Anxiety    Frantic, out of my mind, agitated     Patient Measurements: Height: 5' 4.5" (163.8 cm) Weight: 68.5 kg (151 lb 0.2 oz) IBW/kg (Calculated) : 55.85 Heparin Dosing Weight: 68kg  Vital Signs: Temp: 97.6 F (36.4 C) (08/11 1013) Temp Source: Oral (08/11 1013) BP: 152/44 (08/11 0939) Pulse Rate: 87 (08/11 0939)  Labs: Recent Labs    05/02/21 0141 05/03/21 0207 05/03/21 1554 05/04/21 0443 05/04/21 1407 05/04/21 1559  HGB 8.4* 9.7* 7.8* 6.9* 9.3*  --   HCT 25.0* 29.7* 23.0* 20.8* 28.0*  --   PLT 328 434*  --  312  --   --   HEPARINUNFRC  --   --   --   --   --  <0.10*  CREATININE 1.38* 1.28* 0.90 1.40*  --   --      Estimated Creatinine Clearance: 31.3 mL/min (A) (by C-G formula based on SCr of 1.4 mg/dL (H)).   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 There is a small calcified mass in the L:A near the LA appendage. No valvular masses seen with associated mitral annular calcification. LA Volume/ BSA27.4 ml/m2 No AS. Right ventricular  systolic pressure is elevated at 48mmHg.   Myocardial infarction [facility])    June 2021   Neuromuscular disorder ([place])    neuropathy in bilateral feet   Peripheral vascular disease ([place])    Pneumonia    not hosp.    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  PTCAs with drug coated balloon Lt ant tibial artery and Lt popliteal artery  11/26/2019     Assessment: 80 yo W admitted with toe gangrene now s/p left common femoral - tibioperoneal trunk bypass with 21mm ringed ePTFE in anatomic tunnel and toe amputation. Patient was on heparin 500 units/hr no titration postop. No anticoagulation prior to admission. Pharmacy consulted for therapeutic heparin to promote graft patency. Per VVS, planning eventual transition to [place].   Heparin level undetectable, no infusion issues per nursing.  Goal of Therapy:  Heparin level 0.3-0.7 units/ml Monitor platelets by anticoagulation protocol: Yes   Plan:  Increase heparin to 1250 units/h Recheck heparin level in 8h  Arrie Senate, PharmD, [place], [facility] Clinical Pharmacist [phone] Please check AMION for all [place] numbers 05/04/2021

## 2021-05-04 NOTE — Evaluation (Signed)
Physical Therapy Evaluation Patient Details Name: [name] MRN: [id number] DOB: [birth year] Today's Date: 05/04/2021   History of Present Illness  Pt adm 8/4 with lt third toe gangrene. Pt underwent abdominal aortogram with bilateral lower extremity runoff on 8/5. Pt underwent lt 3rd toe amputation  and lt common femoral - tibioperoneal trunk bypass on 8/10. Pt with incidental covid + finding. PMH - multiple LE vascular surgeries, rt transmetatarsal amputation, HTN, CABG, DM, PVD, diabetic neuropathy  Clinical Impression  Pt presents to PT with significant decr in mobility due to pain and weakness after surgery. Today pt didn't yet have Darco shoe so limited mobility just bed to chair. Will need to make steady progress to return home. Pt reports daughter can assist at DC.     Follow Up Recommendations Home health PT;Supervision for mobility/OOB (assuming pt progresses steadily)    Equipment Recommendations  None recommended by PT    Recommendations for Other Services       Precautions / Restrictions Precautions Precautions: Fall Required Braces or Orthoses: Other Brace Other Brace: Darco shoe on lt foot. Pt also wears Darco on lt foot. Restrictions Weight Bearing Restrictions: Yes Other Position/Activity Restrictions: weight bearing on heel      Mobility  Bed Mobility Overal bed mobility: Needs Assistance Bed Mobility: Supine to Sit     Supine to sit: Min assist     General bed mobility comments: Assist to elevate trunk into sitting    Transfers Overall transfer level: Needs assistance Equipment used: Rolling walker (2 wheeled) Transfers: Sit to/from [facility] Sit to Stand: Mod assist;+2 safety/equipment Stand pivot transfers: Mod assist;+2 safety/equipment       General transfer comment: Assist to bring hips up and for balance. Pt with small pivotal steps bed to bsc. Stood from [facility] and switched the [facility] with the recliner  Ambulation/Gait              General Gait Details: Did not attempt due to pt without Darco shoe for LLE  Stairs            Wheelchair Mobility    Modified Rankin (Stroke Patients Only)       Balance Overall balance assessment: Needs assistance Sitting-balance support: No upper extremity supported;Feet unsupported Sitting balance-Leahy Scale: Fair     Standing balance support: Bilateral upper extremity supported Standing balance-Leahy Scale: Poor Standing balance comment: walker and min to mod assist. Pt with flexed posture .               High Level Balance Comments: VSS on RA             Pertinent Vitals/Pain Pain Assessment: Faces Faces Pain Scale: Hurts even more Pain Location: LLE Pain Descriptors / Indicators: Tightness;Grimacing;Guarding Pain Intervention(s): Limited activity within patient's tolerance;Repositioned;Monitored during session    Home Living Family/patient expects to be discharged to:: Private residence Living Arrangements: Children Available Help at Discharge: Family;Available 24 hours/day Type of Home: House Home Access: Level entry     Home Layout: One level Home Equipment: Walker - 2 wheels;Bedside commode;Grab bars - tub/shower;Cane - single point      Prior Function Level of Independence: Independent               Hand Dominance   Dominant Hand: Right    Extremity/Trunk Assessment   Upper Extremity Assessment Upper Extremity Assessment: Defer to OT evaluation    Lower Extremity Assessment Lower Extremity Assessment: Generalized weakness;LLE deficits/detail LLE Deficits / Details: limited  by pain       Communication   Communication: No difficulties  Cognition Arousal/Alertness: Awake/alert Behavior During Therapy: WFL for tasks assessed/performed Overall Cognitive Status: Within Functional Limits for tasks assessed                                        General Comments      Exercises      Assessment/Plan    PT Assessment Patient needs continued PT services  PT Problem List Decreased strength;Decreased balance;Pain;Decreased mobility;Decreased activity tolerance       PT Treatment Interventions Gait training;Therapeutic exercise;Therapeutic activities;Functional mobility training;DME instruction;Balance training;Patient/family education    PT Goals (Current goals can be found in the Care Plan section)  Acute Rehab PT Goals Patient Stated Goal: return home PT Goal Formulation: With patient Time For Goal Achievement: 05/18/21 Potential to Achieve Goals: Fair    Frequency Min 3X/week   Barriers to discharge Decreased caregiver support      Co-evaluation               AM-PAC PT "6 Clicks" Mobility  Outcome Measure Help needed turning from your back to your side while in a flat bed without using bedrails?: A Little Help needed moving from lying on your back to sitting on the side of a flat bed without using bedrails?: A Little Help needed moving to and from a bed to a chair (including a wheelchair)?: A Lot Help needed standing up from a chair using your arms (e.g., wheelchair or bedside chair)?: A Lot Help needed to walk in hospital room?: Total Help needed climbing 3-5 steps with a railing? : Total 6 Click Score: 12    End of Session   Activity Tolerance: Patient limited by pain Patient left: in chair;with call bell/phone within reach;with chair alarm set Nurse Communication: Mobility status PT Visit Diagnosis: Unsteadiness on feet (R26.81);Other abnormalities of gait and mobility (R26.89);Pain Pain - Right/Left: Left Pain - part of body: Leg    Time: 9242-6834 PT Time Calculation (min) (ACUTE ONLY): 31 min   Charges:   PT Evaluation $PT Eval Moderate Complexity: 1 Mod PT Treatments $Therapeutic Activity: 8-22 mins        [place] Pager [phone] Office [place] 05/04/2021,  1:02 PM

## 2021-05-04 NOTE — Plan of Care (Signed)
  Problem: Education: Goal: Knowledge of General Education information will improve Description Including pain rating scale, medication(s)/side effects and non-pharmacologic comfort measures Outcome: Progressing   

## 2021-05-04 NOTE — Progress Notes (Addendum)
ANTICOAGULATION CONSULT NOTE - Initial Consult  Pharmacy Consult for heparin Indication: L common femoral-tibioperoneal trunk bypass   Allergies  Allergen Reactions   Bactrim [Sulfamethoxazole-Trimethoprim] Other (See Comments)    ^ K+( elevated)    Demerol [Meperidine]     Delusional    Scopolamine     Delusional    Tramadol Other (See Comments)    Keeps awake   Versed [Midazolam] Anxiety    Frantic, out of my mind, agitated     Patient Measurements: Height: 5' 4.5" (163.8 cm) Weight: 68.5 kg (151 lb 0.2 oz) IBW/kg (Calculated) : 55.85 Heparin Dosing Weight: 68kg  Vital Signs: Temp: 97.6 F (36.4 C) (08/11 0657) Temp Source: Oral (08/11 0657) BP: 129/45 (08/11 0657) Pulse Rate: 68 (08/11 0657)  Labs: Recent Labs    05/02/21 0141 05/03/21 0207 05/04/21 0443  HGB 8.4* 9.7* 6.9*  HCT 25.0* 29.7* 20.8*  PLT 328 434* 312  CREATININE 1.38* 1.28* 1.40*    Estimated Creatinine Clearance: 31.3 mL/min (A) (by C-G formula based on SCr of 1.4 mg/dL (H)).   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 There is a small calcified mass in the L:A near the LA appendage. No valvular masses seen with associated mitral annular calcification. LA Volume/ BSA27.4 ml/m2 No AS. Right ventricular systolic pressure is elevated at 32mmHg.   Myocardial infarction [facility])    June 2021   Neuromuscular disorder ([place])    neuropathy in bilateral feet    Peripheral vascular disease ([place])    Pneumonia    not hosp.    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  PTCAs with drug coated balloon Lt ant tibial artery and Lt popliteal artery  11/26/2019    Assessment: 80 yo W admitted with toe gangrene now s/p left common femoral - tibioperoneal trunk bypass with 31mm ringed ePTFE in anatomic tunnel and toe amputation. Patient was on heparin 500 units/hr no titration postop. No anticoagulation prior to admission. Pharmacy consulted for therapeutic heparin to promote graft patency. Per VVS, planning eventual transition to [place].   H/H drop postop, plt stable, no reported bleeding. Transfusing 1 unit RBC.   Goal of Therapy:  Heparin level 0.3-0.7 units/ml Monitor platelets by anticoagulation protocol: Yes   Plan:  Increase heparin to 1000 units/hr, no bolus  F/u 8hr HL  Monitor daily HL, CBC/plt Monitor for signs/symptoms of bleeding  F/u transition to [place] when H/H stable   Benetta Spar, PharmD, BCPS, [facility] Clinical Pharmacist  Please check AMION for all [place] phone numbers After 10:00 PM, call [place]

## 2021-05-04 NOTE — Progress Notes (Signed)
@  30 Dr. Nevada Crane, on-call for attending, text-paged regarding pt's critical Hgb of 6.9.  Page promptly returned and order received to transfuse 1 unit PRBCs. Will administer and continue to monitor.

## 2021-05-04 NOTE — Progress Notes (Addendum)
  Progress Note    05/04/2021 8:20 AM 1 Day Post-Op  Subjective:  States L foot feels better than before surgery   Vitals:   05/04/21 0642 05/04/21 0657  BP:  (!) 129/45  Pulse: 70 68  Resp: 15 15  Temp:  97.6 F (36.4 C)  SpO2: 100% 99%   Physical Exam: Lungs:  non labored Incisions:  L groin and calf c/d/i Extremities:  brisk L ATA signal by doppler; soft L PTA by doppler Neurologic: A&O  CBC    Component Value Date/Time   WBC 21.1 (H) 05/04/2021 0443   RBC 2.32 (L) 05/04/2021 0443   HGB 6.9 (LL) 05/04/2021 0443   HGB 12.2 07/29/2020 0942   HCT 20.8 (L) 05/04/2021 0443   HCT 37.4 07/29/2020 0942   PLT 312 05/04/2021 0443   PLT 304 07/29/2020 0942   MCV 89.7 05/04/2021 0443   MCV 84 07/29/2020 0942   MCH 29.7 05/04/2021 0443   MCHC 33.2 05/04/2021 0443   RDW 14.2 05/04/2021 0443   RDW 14.8 07/29/2020 0942   LYMPHSABS 1.2 04/27/2021 1228   MONOABS 1.9 (H) 04/27/2021 1228   EOSABS 0.0 04/27/2021 1228   BASOSABS 0.1 04/27/2021 1228    BMET    Component Value Date/Time   NA 136 05/04/2021 0443   NA 142 07/29/2020 0942   K 4.4 05/04/2021 0443   CL 109 05/04/2021 0443   CO2 19 (L) 05/04/2021 0443   GLUCOSE 222 (H) 05/04/2021 0443   BUN 25 (H) 05/04/2021 0443   BUN 17 07/29/2020 0942   CREATININE 1.40 (H) 05/04/2021 0443   CREATININE 0.86 12/09/2019 1136   CALCIUM 7.7 (L) 05/04/2021 0443   GFRNONAA 38 (L) 05/04/2021 0443   GFRAA 75 07/29/2020 0942    INR    Component Value Date/Time   INR 1.1 04/27/2021 1228     Intake/Output Summary (Last 24 hours) at 05/04/2021 0820 Last data filed at 05/04/2021 0657 Gross per 24 hour  Intake 4633.98 ml  Output 940 ml  Net 3693.98 ml     Assessment/Plan:  80 y.o. female is s/p L femoral to tp trunk bypass with PTFE 1 Day Post-Op   L LE well perfused with brisk ATA doppler signal Wet to dry packing of toe amp site starting tomorrow Darco shoe ordered; heel weightbearing only LLE PT/OT    Dagoberto Ligas, PA-C [facility] [phone] 05/04/2021 8:20 AM   VASCULAR STAFF ADDENDUM: I have independently interviewed and examined the patient. I agree with the above.  Good technical result from bypass yesterday. Wet-to-dry dressing changes BID starting tomorrow. Will transition to [facility] as soon as infection cleared. Cultures pending. Continue antibiotics x 5 days. PT / OT / OOB / Ambulate with heel weightbearing  [name]. Stanford Breed, MD [facility] Phone Number: [phone] 05/04/2021 9:32 AM

## 2021-05-04 NOTE — TOC Benefit Eligibility Note (Signed)
Patient Teacher, English as a foreign language completed.    The patient is currently admitted and upon discharge could be taking Eliquis 5 mg.  The current 30 day co-pay is, $133.39 due to being in Coverage Gap (donut hole).   The patient is insured through [employer] [place] Medicare Part D     Lyndel Safe, [place] Patient Advocate Specialist [place] Team Direct Number: [phone]  Fax: [phone]

## 2021-05-04 NOTE — Evaluation (Signed)
Occupational Therapy Evaluation Patient Details Name: [name] MRN: [id number] DOB: [birth year] Today's Date: 05/04/2021    History of Present Illness Pt adm 8/4 with lt third toe gangrene. Pt underwent abdominal aortogram with bilateral lower extremity runoff on 8/5. Pt underwent lt 3rd toe amputation  and lt common femoral - tibioperoneal trunk bypass on 8/10. Pt with incidental covid + finding. PMH - multiple LE vascular surgeries, rt transmetatarsal amputation, HTN, CABG, DM, PVD, diabetic neuropathy   Clinical Impression   PTA, pt lives with daughter and reports typically Independent with ADLs, IADLs and mobility though has been using cane recently. Pt presents with deficits in strength, standing balance and endurance. OT eval limited to transfers only due to L darco shoe not delivered to pt's room yet. Pt overall Setup for UB ADLs, Min A for LB ADLs and Min A for sit to stand transfers using RW. Anticipate pt to progress well enough to return home with [place]. Will continue to follow and hopefully progress activity when L darco shoe delivered.     Follow Up Recommendations  Home health OT;Supervision - Intermittent    Equipment Recommendations  None recommended by OT    Recommendations for Other Services       Precautions / Restrictions Precautions Precautions: Fall Required Braces or Orthoses: Other Brace Other Brace: Darco shoe on L foot. Pt also wears Darco on R foot (for almost 2 years) Restrictions Weight Bearing Restrictions: Yes Other Position/Activity Restrictions: weight bearing on heel      Mobility Bed Mobility Overal bed mobility: Needs Assistance Bed Mobility: Supine to Sit     Supine to sit: Min assist     General bed mobility comments: in chair on entry    Transfers Overall transfer level: Needs assistance Equipment used: Rolling walker (2 wheeled) Transfers: Sit to/from [facility] Sit to Stand: Min assist Stand pivot transfers:  Mod assist;+2 safety/equipment       General transfer comment: Min A for sit to stand from recliner with RW, increased time to power up and assist to steady    Balance Overall balance assessment: Needs assistance Sitting-balance support: No upper extremity supported;Feet unsupported Sitting balance-Leahy Scale: Good     Standing balance support: Bilateral upper extremity supported Standing balance-Leahy Scale: Poor Standing balance comment: Reliant on UE support and external support               High Level Balance Comments: VSS on RA           ADL either performed or assessed with clinical judgement   ADL Overall ADL's : Needs assistance/impaired Eating/Feeding: Independent;Sitting   Grooming: Set up;Sitting   Upper Body Bathing: Set up;Sitting   Lower Body Bathing: Minimal assistance;Sit to/from stand   Upper Body Dressing : Set up;Sitting   Lower Body Dressing: Minimal assistance;Sit to/from stand Lower Body Dressing Details (indicate cue type and reason): for balance in standing likely. able to manage darco shoe to R foot     Toileting- Clothing Manipulation and Hygiene: Minimal assistance;Sit to/from stand         General ADL Comments: Denies any pain, limited in further ADL mobility assessment due to no darco shoe for L foot on eval     Vision Baseline Vision/History: Wears glasses Wears Glasses: Reading only Patient Visual Report: No change from baseline Vision Assessment?: No apparent visual deficits     Perception     Praxis      Pertinent Vitals/Pain Pain Assessment: No/denies pain  Faces Pain Scale: Hurts even more Pain Location: LLE Pain Descriptors / Indicators: Tightness;Grimacing;Guarding Pain Intervention(s): Monitored during session     Hand Dominance Right   Extremity/Trunk Assessment Upper Extremity Assessment Upper Extremity Assessment: Overall WFL for tasks assessed   Lower Extremity Assessment Lower Extremity  Assessment: Defer to PT evaluation LLE Deficits / Details: limited by pain   Cervical / Trunk Assessment Cervical / Trunk Assessment: Kyphotic   Communication Communication Communication: No difficulties   Cognition Arousal/Alertness: Awake/alert Behavior During Therapy: WFL for tasks assessed/performed Overall Cognitive Status: Within Functional Limits for tasks assessed                                     General Comments       Exercises     Shoulder Instructions      Home Living Family/patient expects to be discharged to:: Private residence Living Arrangements: Children Available Help at Discharge: Family;Available PRN/intermittently (daughter works) Type of Home: House Home Access: Stairs to enter Technical brewer of Steps: 1   Home Layout: One level     Bathroom Shower/Tub: Teacher, early years/pre: Handicapped height     Home Equipment: Environmental consultant - 2 wheels;Bedside commode;Grab bars - tub/shower;Cane - single point          Prior Functioning/Environment Level of Independence: Independent with assistive device(s)        Comments: does not use AD typically at baseline but has been using a cane recently. Pt reports driving, doing ADLs, IADLs, etc. Pt reports wearing darco shoe to R foot for almost 2 years        OT Problem List: Decreased strength;Impaired balance (sitting and/or standing);Decreased activity tolerance      OT Treatment/Interventions: Self-care/ADL training;Therapeutic exercise;Energy conservation;DME and/or AE instruction;Therapeutic activities;Patient/family education;Balance training    OT Goals(Current goals can be found in the care plan section) Acute Rehab OT Goals Patient Stated Goal: return home OT Goal Formulation: With patient Time For Goal Achievement: 05/18/21 Potential to Achieve Goals: Good  OT Frequency: Min 2X/week   Barriers to D/C:            Co-evaluation              AM-PAC  OT "6 Clicks" Daily Activity     Outcome Measure Help from another person eating meals?: None Help from another person taking care of personal grooming?: A Little Help from another person toileting, which includes using toliet, bedpan, or urinal?: A Little Help from another person bathing (including washing, rinsing, drying)?: A Little Help from another person to put on and taking off regular upper body clothing?: A Little Help from another person to put on and taking off regular lower body clothing?: A Little 6 Click Score: 19   End of Session Equipment Utilized During Treatment: Rolling walker Nurse Communication: Mobility status  Activity Tolerance: Patient tolerated treatment well Patient left: in chair;with call bell/phone within reach;with chair alarm set  OT Visit Diagnosis: Unsteadiness on feet (R26.81);Other abnormalities of gait and mobility (R26.89)                Time: 9741-6384 OT Time Calculation (min): 30 min Charges:  OT General Charges $OT Visit: 1 Visit OT Evaluation $OT Eval Low Complexity: 1 Low OT Treatments $Therapeutic Activity: 8-22 mins  Malachy Chamber, OTR/L Acute Rehab Services Office: [phone]   Layla Maw 05/04/2021, 1:20 PM

## 2021-05-04 NOTE — Progress Notes (Signed)
Foley removed at 0700, bladder scan revealed 118 on the first scan and 38mL on the second scan. Pt denies urge to void. Purewick in place, will continue to monitor.   Pauletta Pickney M

## 2021-05-05 DIAGNOSIS — M86172 Other acute osteomyelitis, left ankle and foot: Secondary | ICD-10-CM | POA: Diagnosis not present

## 2021-05-05 LAB — BASIC METABOLIC PANEL
Anion gap: 9 (ref 5–15)
Anion gap: 10 (ref 5–15)
BUN: 37 mg/dL — ABNORMAL HIGH (ref 8–23)
BUN: 37 mg/dL — ABNORMAL HIGH (ref 8–23)
CO2: 18 mmol/L — ABNORMAL LOW (ref 22–32)
CO2: 17 mmol/L — ABNORMAL LOW (ref 22–32)
Calcium: 7.9 mg/dL — ABNORMAL LOW (ref 8.9–10.3)
Calcium: 8 mg/dL — ABNORMAL LOW (ref 8.9–10.3)
Chloride: 104 mmol/L (ref 98–111)
Chloride: 106 mmol/L (ref 98–111)
Creatinine, Ser: 1.6 mg/dL — ABNORMAL HIGH (ref 0.44–1.00)
Creatinine, Ser: 1.48 mg/dL — ABNORMAL HIGH (ref 0.44–1.00)
GFR, Estimated: 33 mL/min — ABNORMAL LOW (ref >60)
GFR, Estimated: 36 mL/min — ABNORMAL LOW (ref >60)
Glucose, Bld: 281 mg/dL — ABNORMAL HIGH (ref 70–99)
Glucose, Bld: 202 mg/dL — ABNORMAL HIGH (ref 70–99)
Potassium: 3.7 mmol/L (ref 3.5–5.1)
Potassium: 3.6 mmol/L (ref 3.5–5.1)
Sodium: 131 mmol/L — ABNORMAL LOW (ref 135–145)
Sodium: 133 mmol/L — ABNORMAL LOW (ref 135–145)

## 2021-05-05 LAB — CBC
HCT: 26.2 % — ABNORMAL LOW (ref 36.0–46.0)
HCT: 25.9 % — ABNORMAL LOW (ref 36.0–46.0)
Hemoglobin: 8.5 g/dL — ABNORMAL LOW (ref 12.0–15.0)
Hemoglobin: 8.5 g/dL — ABNORMAL LOW (ref 12.0–15.0)
MCH: 28.7 pg (ref 26.0–34.0)
MCH: 29.1 pg (ref 26.0–34.0)
MCHC: 32.4 g/dL (ref 30.0–36.0)
MCHC: 32.8 g/dL (ref 30.0–36.0)
MCV: 88.5 fL (ref 80.0–100.0)
MCV: 88.7 fL (ref 80.0–100.0)
Platelets: 350 10*3/uL (ref 150–400)
Platelets: 363 10*3/uL (ref 150–400)
RBC: 2.96 MIL/uL — ABNORMAL LOW (ref 3.87–5.11)
RBC: 2.92 MIL/uL — ABNORMAL LOW (ref 3.87–5.11)
RDW: 14.8 % (ref 11.5–15.5)
RDW: 14.6 % (ref 11.5–15.5)
WBC: 21.9 10*3/uL — ABNORMAL HIGH (ref 4.0–10.5)
WBC: 20.8 10*3/uL — ABNORMAL HIGH (ref 4.0–10.5)
nRBC: 0.1 % (ref 0.0–0.2)
nRBC: 0.4 % — ABNORMAL HIGH (ref 0.0–0.2)

## 2021-05-05 LAB — TYPE AND SCREEN
ABO/RH(D): O POS
Antibody Screen: NEGATIVE
Unit division: 0
Unit division: 0

## 2021-05-05 LAB — HEPARIN LEVEL (UNFRACTIONATED)
Heparin Unfractionated: 0.13 IU/mL — ABNORMAL LOW (ref 0.30–0.70)
Heparin Unfractionated: 0.15 IU/mL — ABNORMAL LOW (ref 0.30–0.70)
Heparin Unfractionated: 0.29 IU/mL — ABNORMAL LOW (ref 0.30–0.70)

## 2021-05-05 LAB — GLUCOSE, CAPILLARY
Glucose-Capillary: 249 mg/dL — ABNORMAL HIGH (ref 70–99)
Glucose-Capillary: 194 mg/dL — ABNORMAL HIGH (ref 70–99)
Glucose-Capillary: 195 mg/dL — ABNORMAL HIGH (ref 70–99)
Glucose-Capillary: 208 mg/dL — ABNORMAL HIGH (ref 70–99)
Glucose-Capillary: 198 mg/dL — ABNORMAL HIGH (ref 70–99)

## 2021-05-05 LAB — BPAM RBC
Blood Product Expiration Date: 202209062359
Blood Product Expiration Date: 202209102359
ISSUE DATE / TIME: 202208101648
ISSUE DATE / TIME: 202208110634
Unit Type and Rh: 5100
Unit Type and Rh: 5100

## 2021-05-05 MED ORDER — VANCOMYCIN HCL 1250 MG/250ML IV SOLN
1250.0000 mg | INTRAVENOUS | Status: DC
  Filled 2021-05-05: qty 250

## 2021-05-05 MED ORDER — HEPARIN SODIUM (PORCINE) 5000 UNIT/ML IJ SOLN
INTRAMUSCULAR | Status: AC
  Filled 2021-05-05: qty 1

## 2021-05-05 NOTE — Progress Notes (Signed)
Pharmacy Antibiotic Note  [name] is a 80 y.o. female admitted on 04/27/2021 with  osteomyelitis . She is also noted s/p left fem pop bypass.  Pharmacy has been consulted for vancomycin dosing (today is day 9). Plans noted for 5 days from 8/11 -WBC= 21.9, afebile -Wound cultures with staph aureus   Plan: Continue Vancomycin 750 mg IV q24h, Goal AUC 400-550 Monitor renal function, clinical status Will check vancomycin levels once culture sensitivity is known  Height: 5' 4.5" (163.8 cm) Weight: 68.5 kg (151 lb 0.2 oz) IBW/kg (Calculated) : 55.85  Temp (24hrs), Avg:97.9 F (36.6 C), Min:97.5 F (36.4 C), Max:98.2 F (36.8 C)  Recent Labs  Lab 04/30/21 1742 04/30/21 2141 05/01/21 0146 05/02/21 0141 05/03/21 0207 05/03/21 1554 05/04/21 0443 05/05/21 0247  WBC  --   --  17.0* 17.0* 27.9*  --  21.1* 21.9*  CREATININE  --   --  1.43* 1.38* 1.28* 0.90 1.40* 1.60*  VANCOTROUGH 14*  --   --   --   --   --   --   --   VANCOPEAK  --  33  --   --   --   --   --   --      Estimated Creatinine Clearance: 27.4 mL/min (A) (by C-G formula based on SCr of 1.6 mg/dL (H)).    Allergies  Allergen Reactions   Bactrim [Sulfamethoxazole-Trimethoprim] Other (See Comments)    ^ K+( elevated)    Demerol [Meperidine]     Delusional    Scopolamine     Delusional    Tramadol Other (See Comments)    Keeps awake   Versed [Midazolam] Anxiety    Frantic, out of my mind, agitated     Antimicrobials this admission: Zosyn 8/4 >> Vancomycin 8/4 >>  Dose adjustments this admission:   Microbiology results: 8/4 BCx: neg 8/10 wound swab - staph aueus   Thank you for involving pharmacy in this patient's care.  Hildred Laser, PharmD Clinical Pharmacist **Pharmacist phone directory can now be found on [email or web address] ([email or web address]).  Listed under [place].

## 2021-05-05 NOTE — Progress Notes (Signed)
MD on call notified about pt being unable to urinate and having 342mL in her bladder. Being that the pt has already required I&O one time today she would like for a foley to be placed.

## 2021-05-05 NOTE — Progress Notes (Signed)
[place] for heparin Indication: L common femoral-tibioperoneal trunk bypass   Allergies  Allergen Reactions   Bactrim [Sulfamethoxazole-Trimethoprim] Other (See Comments)    ^ K+( elevated)    Demerol [Meperidine]     Delusional    Scopolamine     Delusional    Tramadol Other (See Comments)    Keeps awake   Versed [Midazolam] Anxiety    Frantic, out of my mind, agitated     Patient Measurements: Height: 5' 4.5" (163.8 cm) Weight: 68.5 kg (151 lb 0.2 oz) IBW/kg (Calculated) : 55.85 Heparin Dosing Weight: 68kg  Vital Signs: Temp: 97.7 F (36.5 C) (08/12 1255) Temp Source: Oral (08/12 1255) BP: 150/55 (08/12 1255) Pulse Rate: 77 (08/12 1255)  Labs: Recent Labs    05/03/21 0207 05/03/21 1554 05/04/21 0443 05/04/21 1407 05/04/21 1559 05/05/21 0247 05/05/21 1148  HGB 9.7* 7.8* 6.9* 9.3*  --  8.5*  --   HCT 29.7* 23.0* 20.8* 28.0*  --  26.2*  --   PLT 434*  --  312  --   --  350  --   HEPARINUNFRC  --   --   --   --  <0.10* 0.13* 0.15*  CREATININE 1.28* 0.90 1.40*  --   --  1.60*  --      Estimated Creatinine Clearance: 27.4 mL/min (A) (by C-G formula based on SCr of 1.6 mg/dL (H)).   Medical History: Past Medical History:  Diagnosis Date   Anemia    after CABG in june 2021   Arthritis    Cancer ([place])    removal from nose - MOSE procedure    Complication of anesthesia    VERSED- agitated, muscle spasms, "jerking" , frantic , (never had this occurence in the pas)    Coronary artery disease    Diabetes mellitus without complication ([place])    Dysrhythmia    PVC's   GERD (gastroesophageal reflux disease)    Heart murmur    History of hiatal hernia    Hyperlipidemia    Hypertension 11/20/11   ECHO- EF>55% Borderline concentric left ventricular hypertrophy. There is a small calcified mass in the L:A near the LA appendage. No valvular masses seen with associated mitral annular calcification. LA Volume/ BSA27.4 ml/m2 No  AS. Right ventricular systolic pressure is elevated at 85mmHg.   Myocardial infarction [facility])    June 2021   Neuromuscular disorder ([place])    neuropathy in bilateral feet   Peripheral vascular disease ([place])    Pneumonia    not hosp.    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  PTCAs with drug coated balloon Lt ant tibial artery and Lt popliteal artery  11/26/2019     Assessment: 80 yo W admitted with toe gangrene now s/p left common femoral - tibioperoneal trunk bypass with 57mm ringed ePTFE in anatomic tunnel and toe amputation on 8/10. Patient was on heparin 500 units/hr no titration postop. No anticoagulation prior to admission. Pharmacy consulted for therapeutic heparin to promote graft patency. Per VVS, planning eventual transition to Xarelto -hg= 8.5 (noted 6.9 on 8/11)   Goal of Therapy:  Heparin level 0.3-0.7 units/ml Monitor platelets by anticoagulation protocol: Yes   Plan:  -Increase heparin to 1550 units/hr -Heparin level in 8 hours and daily wth CBC daily  Hildred Laser, PharmD Clinical Pharmacist **Pharmacist phone directory can now be found on [email or web address] ([email or web address]).  Listed under [place].

## 2021-05-05 NOTE — Progress Notes (Signed)
ANTICOAGULATION CONSULT NOTE Pharmacy Consult for heparin Indication: L common femoral-tibioperoneal trunk bypass   Allergies  Allergen Reactions   Bactrim [Sulfamethoxazole-Trimethoprim] Other (See Comments)    ^ K+( elevated)    Demerol [Meperidine]     Delusional    Scopolamine     Delusional    Tramadol Other (See Comments)    Keeps awake   Versed [Midazolam] Anxiety    Frantic, out of my mind, agitated     Patient Measurements: Height: 5' 4.5" (163.8 cm) Weight: 68.5 kg (151 lb 0.2 oz) IBW/kg (Calculated) : 55.85 Heparin Dosing Weight: 68kg  Vital Signs: Temp: 98.1 F (36.7 C) (08/12 2305) Temp Source: Oral (08/12 2305) BP: 148/39 (08/12 2305) Pulse Rate: 77 (08/12 1255)  Labs: Recent Labs    05/04/21 0443 05/04/21 1407 05/04/21 1559 05/05/21 0247 05/05/21 1148 05/05/21 2252  HGB 6.9* 9.3*  --  8.5*  --  8.5*  HCT 20.8* 28.0*  --  26.2*  --  25.9*  PLT 312  --   --  350  --  363  HEPARINUNFRC  --   --    < > 0.13* 0.15* 0.29*  CREATININE 1.40*  --   --  1.60*  --  1.48*   < > = values in this interval not displayed.     Estimated Creatinine Clearance: 29.6 mL/min (A) (by C-G formula based on SCr of 1.48 mg/dL (H)).    Assessment: 80 y.o. female with PVD s/p fem pop bypass for heparin  Goal of Therapy:  Heparin level 0.3-0.7 units/ml Monitor platelets by anticoagulation protocol: Yes   Plan:  .Increase Heparin  1650 units/hr  Phillis Knack, PharmD, BCPS

## 2021-05-05 NOTE — NC FL2 (Signed)
[place] MEDICAID FL2 LEVEL OF CARE SCREENING TOOL     IDENTIFICATION  Patient Name: [name] Birthdate: 1940/10/22 Sex: female Admission Date (Current Location): 04/27/2021  [facility] and [place] Number:  Herbalist and Address:  [facility] [place], [place] [address] [place],  [address]      Provider Number: [id number]  Attending Physician Name and Address:  Ezekiel Slocumb, DO  Relative Name and Phone Number:       Current Level of Care: Hospital Recommended Level of Care: [place] Prior Approval Number:    Date Approved/Denied:   PASRR Number:  [id number]  Discharge Plan: SNF    Current Diagnoses: Patient Active Problem List   Diagnosis Date Noted   Osteomyelitis of foot, left, acute ([place]) 04/27/2021   Cellulitis in diabetic foot ([place]) 04/27/2021   Acute kidney injury superimposed on chronic kidney disease ([place]) 04/27/2021   Hyponatremia 04/27/2021   Anemia, chronic disease 04/27/2021   Cellulitis of left lower extremity    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[place]) 08/07/2019   Critical lower limb ischemia ([place]) 08/03/2019   Encounter for immunization 07/09/2016   Essential hypertension 03/31/2013   Dyslipidemia 03/31/2013   DM2 (diabetes mellitus, type 2) ([place]) 03/31/2013   Overweight (BMI 25.0-29.9) 03/31/2013   Carotid artery disease ([place]) 03/31/2013   PAD (peripheral artery  disease) ([place]) 03/31/2013    Orientation RESPIRATION BLADDER Height & Weight     Self, Time, Situation, Place  Normal Continent, External catheter Weight: 68.5 kg Height:  5' 4.5" (163.8 cm)  BEHAVIORAL SYMPTOMS/MOOD NEUROLOGICAL BOWEL NUTRITION STATUS      Continent Diet  AMBULATORY STATUS COMMUNICATION OF NEEDS Skin   Limited Assist Verbally Surgical wounds (Amputation to left third toe)                       Personal Care Assistance Level of Assistance  Feeding, Bathing, Dressing, Total care Bathing Assistance: Limited assistance Feeding assistance: Independent (After setup) Dressing Assistance: Limited assistance Total Care Assistance: Limited assistance   Functional Limitations Info  Sight, Hearing, Speech Sight Info: Adequate Hearing Info: Impaired Speech Info: Adequate    SPECIAL CARE FACTORS FREQUENCY  PT (By licensed PT)     PT Frequency: 5x/week              Contractures Contractures Info: Not present    Additional Factors Info  Code Status, Allergies, Insulin Sliding Scale, Isolation Precautions Code Status Info: Full Allergies Info: Bactrim, Demerol, Scopolamine, Tramadol, Versed.   Insulin Sliding Scale Info: Novolog. See chart for dosage. Isolation Precautions Info: Airborne/ Contact     Current Medications (05/05/2021):  This is the current hospital active medication list Current Facility-Administered Medications  Medication Dose Route Frequency Provider Last Rate Last Admin   0.9 %  sodium chloride infusion  500 mL Intravenous Once PRN Dagoberto Ligas, PA-C       acetaminophen (TYLENOL) tablet 650 mg  650 mg Oral Q6H PRN Dagoberto Ligas, PA-C   650 mg at 05/03/21 2202   Or   acetaminophen (TYLENOL) suppository 650 mg  650 mg Rectal Q6H PRN Dagoberto Ligas, PA-C       albuterol (PROVENTIL) (2.5 MG/3ML) 0.083% nebulizer solution 2.5 mg  2.5 mg Nebulization Q6H PRN Dagoberto Ligas, PA-C       alum & mag hydroxide-simeth (MAALOX/MYLANTA)  200-200-20 MG/5ML suspension 15-30 mL  15-30 mL Oral Q2H PRN Dagoberto Ligas, PA-C       ascorbic acid (VITAMIN C) tablet 250 mg  250 mg Oral Q supper Dagoberto Ligas, PA-C   250 mg at 05/04/21 1836   aspirin EC tablet 81 mg  81 mg Oral QHS Dagoberto Ligas, PA-C   81 mg at 05/04/21 2106   atorvastatin (LIPITOR) tablet 80 mg  80 mg Oral Daily Dagoberto Ligas, PA-C   80 mg at 05/05/21 0913   brimonidine (ALPHAGAN) 0.2 % ophthalmic solution 1 drop  1 drop Right Eye BID Dagoberto Ligas, PA-C   1 drop at 05/05/21 0912   Chlorhexidine Gluconate Cloth 2 % PADS 6 each  6 each Topical Daily Nicole Kindred A, DO   6 each at 05/05/21 0913   docusate sodium (COLACE) capsule 100 mg  100 mg Oral Daily Dagoberto Ligas, PA-C   100 mg at 05/05/21 0913   guaiFENesin-dextromethorphan (ROBITUSSIN DM) 100-10 MG/5ML syrup 15 mL  15 mL Oral Q4H PRN Dagoberto Ligas, PA-C       heparin ADULT infusion 100 units/mL (25000 units/254mL)  1,550 Units/hr Intravenous Continuous Kris Mouton, RPH 13.5 mL/hr at 05/05/21 0411 1,350 Units/hr at 05/05/21 0411   hydrALAZINE (APRESOLINE) injection 5 mg  5 mg Intravenous Q20 Min PRN Dagoberto Ligas, PA-C       insulin aspart (novoLOG) injection 0-15 Units  0-15 Units Subcutaneous TID WC Dagoberto Ligas, PA-C   3 Units at 05/05/21 1337   labetalol (NORMODYNE) injection 10 mg  10 mg Intravenous Q10 min PRN Dagoberto Ligas, PA-C   10 mg at 05/03/21 1928   magnesium sulfate IVPB 2 g 50 mL  2 g Intravenous Daily PRN Dagoberto Ligas, PA-C       metoprolol tartrate (LOPRESSOR) injection 2-5 mg  2-5 mg Intravenous Q2H PRN Dagoberto Ligas, PA-C       metoprolol tartrate (LOPRESSOR) tablet 37.5 mg  37.5 mg Oral BID Dagoberto Ligas, PA-C   37.5 mg at 05/05/21 0913   morphine 2 MG/ML injection 2 mg  2 mg Intravenous Q1H PRN Dagoberto Ligas, PA-C   2 mg at 05/04/21 0013   ondansetron (ZOFRAN) injection 4 mg  4 mg Intravenous Q6H PRN Dagoberto Ligas, PA-C       ondansetron (ZOFRAN)  tablet 4 mg  4 mg Oral Q6H PRN Dagoberto Ligas, PA-C       oxyCODONE (Oxy IR/ROXICODONE) immediate release tablet 5-10 mg  5-10 mg Oral Q4H PRN Dagoberto Ligas, PA-C   10 mg at 05/03/21 2203   pantoprazole (PROTONIX) EC tablet 40 mg  40 mg Oral Once per day on Mon Wed Fri Dagoberto Ligas, PA-C   40 mg at 05/05/21 0912   phenol (CHLORASEPTIC) mouth spray 1 spray  1 spray Mouth/Throat PRN Dagoberto Ligas, PA-C       polyethylene glycol (MIRALAX / GLYCOLAX) packet 17 g  17 g Oral Daily Dagoberto Ligas, PA-C   17 g at 05/02/21 1235   polyethylene glycol 0.4% and propylene glycol 0.3% (SYSTANE) ophthalmic gel   Left Eye BID  PRN Dagoberto Ligas, PA-C       potassium chloride SA (KLOR-CON) CR tablet 20-40 mEq  20-40 mEq Oral Daily PRN Dagoberto Ligas, PA-C       pyridOXINE (VITAMIN B-6) tablet 100 mg  100 mg Oral QPM Dagoberto Ligas, PA-C   100 mg at 05/04/21 1836   saccharomyces boulardii (FLORASTOR) capsule 250 mg  250 mg Oral BID Dagoberto Ligas, PA-C   250 mg at 05/05/21 0913   sodium chloride flush (NS) 0.9 % injection 3 mL  3 mL Intravenous Q12H Dagoberto Ligas, PA-C   3 mL at 05/05/21 0913   sodium chloride flush (NS) 0.9 % injection 3 mL  3 mL Intravenous PRN Dagoberto Ligas, PA-C       vancomycin (VANCOREADY) IVPB 750 mg/150 mL  750 mg Intravenous Q24H Dagoberto Ligas, PA-C 150 mL/hr at 05/04/21 2110 750 mg at 05/04/21 2110     Discharge Medications: Please see discharge summary for a list of discharge medications.  Relevant Imaging Results:  Relevant Lab Results:   Additional Information SSN# [id number]  Tom-Johnson, Renea Ee, RN

## 2021-05-05 NOTE — Progress Notes (Signed)
Pt has had poor urinary output since foley removal on prior shift. Bladder scan shows >450cc. [place] Crane, MD paged. Verbal orders to in and out cath and continue to monitor with bladder scans.  In and out cath yielded 600cc of urine.

## 2021-05-05 NOTE — Progress Notes (Signed)
PROGRESS NOTE    [name]   GUR:[id number]  DOB: [birth year]  PCP: [name], MD    DOA: 04/27/2021 LOS: 8   Assessment & Plan   Principal Problem:   Osteomyelitis of foot, left, acute ([place]) Active Problems:   Essential hypertension   PAD (peripheral artery disease) ([place])   Cellulitis in diabetic foot ([place])   Acute kidney injury superimposed on chronic kidney disease ([place])   Hyponatremia   Anemia, chronic disease   Cellulitis of left lower extremity   Left great toe gangrene Sepsis due to osteomyelitis, cellulitis -sepsis POA.  Sepsis physiology has improved. Status post angiogram on 8/5 showing severe left superficial femoral artery occlusive disease with 3 cm occlusion of the left popliteal artery, 80% stenosis of the distal right external iliac artery above existing femoropopliteal bypass --Continue IV vancomycin  -- On heparin drip for graft patency --Stop Zosyn given cultures with GPC's (8/11) --Blood cultures negative --Vascular surgery and podiatry are consulted Vascular Recs: OOB/mobilize Wet-to-dry dressing changes BID  May transition to [facility] once infection cleared. Cultures pending. Continue antibiotics x 5 days. PT / OT / OOB / Ambulate with heel weightbearing --Hold Plavix until okay from surgical standpoint  Acute kidney injury -due to prerenal azotemia, had improved but creatinine now rising some again.  Suspect due to poor p.o. intake --Monitor BMP --Encourage p.o. intake and will run some fluids if needed tomorrow if still worsening  Hyponatremia -mild, sodium 131 on morning labs.  Suspect poor p.o. intake.  We will hold off on IV fluids for now and encourage p.o. intake.  IV fluids tomorrow if not improving.  COVID-19 infection -incidentally positive and asymptomatic. --Monitor clinically --Airborne isolation x10 days  Essential hypertension -continue metoprolol.  HCTZ and myocarditis are on hold.  Type 2 diabetes -uncontrolled with  A1c 7.7 in April. --Hold oral meds --Sliding scale NovoLog for now  Chronic iron deficiency anemia -iron panel consistent with severe iron deficiency. -- Consider IV iron infusion postoperatively  CAD status post CABG in 03/2020 -continue aspirin and statin.  Plavix on hold.  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  Continue aspirin and statin.  Plavix on hold.   Patient BMI: Body mass index is 25.52 kg/m.   DVT prophylaxis: SCD's Start: 05/03/21 2005  Lovenox held for surgery   Diet:  Diet Orders (From admission, onward)     Start     Ordered   05/03/21 2300  Diet regular Room service appropriate? Yes; Fluid consistency: Thin  Diet effective now       Question Answer Comment  Room service appropriate? Yes   Fluid consistency: Thin      05/03/21 2302              Code Status: Full Code   Brief Narrative / Hospital Course to Date:   "[name] is a 80 y.o. female with medical history significant of hypertension, hyperlipidemia, CAD s/p CABG in 2021, PVD s/p bypass grafting, diabetes mellitus type II, ray amputation of right foot presented with left third toe wound.  developed redness pain and streaking going up her left leg associated with fevers and chills . She is s/p right common femoral endarterectomy with femoral popliteal bypass graft on 08/16/2020.  And previously had angioplasty with stent continue to have the left SFA. In the ED she was febrile, mildly tachycardic, WBC was 20.8K, x-ray of the left foot noted osteomyelitis of distal phalanx  third toe. -Was also incidentally positive for COVID, is asymptomatic -Vascular following, underwent angiogram on 8/5, noted to have severe left superficial femoral artery occlusive disease, plan for left fem-pop bypass tomorrow. Improving on antibiotics, still with intermittent fevers"  Subjective 05/05/21    Patient up in recliner when seen today.  She reports not needing  any pain medications.  Only has significant pain with moving around but it resolves on its own.  She mentions that her daughter works and she may need to go to a SNF instead of home initially.   Disposition Plan & Communication   Status is: Inpatient  Remains inpatient appropriate because: Further procedures are planned as outlined above  Dispo: The patient is from: Home              Anticipated d/c is to: Home              Patient currently is not medically stable to d/c.   Difficult to place patient No    Consults, Procedures, Significant Events   Consultants:  Vascular surgery Podiatry  Procedures:  8/5 - Ultrasound right groin 8/5 - Abdominal aortogram with bilateral lower extremity runoff 8/10 - left common femoral - tibioperoneal trunk bypass with 38mm ringed ePTFE in anatomic tunnel 8/10 - left third toe open amputation  Antimicrobials:  Anti-infectives (From admission, onward)    Start     Dose/Rate Route Frequency Ordered Stop   05/06/21 0900  vancomycin (VANCOREADY) IVPB 1250 mg/250 mL        1,250 mg 166.7 mL/hr over 90 Minutes Intravenous Every 48 hours 05/05/21 1506     04/28/21 1800  vancomycin (VANCOREADY) IVPB 750 mg/150 mL  Status:  Discontinued        750 mg 150 mL/hr over 60 Minutes Intravenous Every 24 hours 04/28/21 1324 05/05/21 1506   04/28/21 0030  piperacillin-tazobactam (ZOSYN) IVPB 3.375 g  Status:  Discontinued        3.375 g 12.5 mL/hr over 240 Minutes Intravenous Every 8 hours 04/27/21 1650 05/04/21 1358   04/27/21 1603  vancomycin variable dose per unstable renal function (pharmacist dosing)  Status:  Discontinued         Does not apply See admin instructions 04/27/21 1604 04/28/21 1324   04/27/21 1545  piperacillin-tazobactam (ZOSYN) IVPB 3.375 g        3.375 g 100 mL/hr over 30 Minutes Intravenous  Once 04/27/21 1532 04/27/21 1707   04/27/21 1545  vancomycin (VANCOREADY) IVPB 1250 mg/250 mL        1,250 mg 166.7 mL/hr over 90 Minutes  Intravenous  Once 04/27/21 1532 04/28/21 0305         Micro    Objective   Vitals:   05/05/21 0025 05/05/21 0347 05/05/21 0914 05/05/21 1255  BP: (!) 128/48 (!) 134/47 (!) 132/96 (!) 150/55  Pulse: 77 78 98 77  Resp: 16 11 14 16   Temp: 98.2 F (36.8 C) 97.6 F (36.4 C) 98 F (36.7 C) 97.7 F (36.5 C)  TempSrc: Oral Oral  Oral  SpO2: 98% 96% 98% 100%  Weight:      Height:        Intake/Output Summary (Last 24 hours) at 05/05/2021 1510 Last data filed at 05/04/2021 1700 Gross per 24 hour  Intake 140.84 ml  Output --  Net 140.84 ml   Filed Weights   04/27/21 1500  Weight: 68.5 kg    Physical Exam:  General exam: in recliner, awake, alert, no mild distress  due to pain Respiratory system: CTAB, normal respiratory effort, on room air. Cardiovascular system: RRR, no pedal edema.   Central nervous system: A&O x4. no gross focal neurologic deficits, normal speech Skin: dry, intact, normal temp and color Psychiatry: normal mood, congruent affect, judgement and insight appear normal  Labs   Data Reviewed: I have personally reviewed following labs and imaging studies  CBC: Recent Labs  Lab 05/01/21 0146 05/02/21 0141 05/03/21 0207 05/03/21 1554 05/04/21 0443 05/04/21 1407 05/05/21 0247  WBC 17.0* 17.0* 27.9*  --  21.1*  --  21.9*  HGB 8.6* 8.4* 9.7* 7.8* 6.9* 9.3* 8.5*  HCT 26.2* 25.0* 29.7* 23.0* 20.8* 28.0* 26.2*  MCV 89.1 86.8 90.3  --  89.7  --  88.5  PLT 281 328 434*  --  312  --  664   Basic Metabolic Panel: Recent Labs  Lab 05/01/21 0146 05/02/21 0141 05/03/21 0207 05/03/21 1554 05/04/21 0443 05/05/21 0247  NA 136 133* 135 137 136 131*  K 3.3* 3.6 3.3* 3.6 4.4 3.7  CL 106 105 103 107 109 104  CO2 19* 18* 19*  --  19* 18*  GLUCOSE 156* 145* 155* 114* 222* 281*  BUN 23 20 18 16  25* 37*  CREATININE 1.43* 1.38* 1.28* 0.90 1.40* 1.60*  CALCIUM 8.2* 8.1* 8.6*  --  7.7* 7.9*  MG 2.0  --   --   --   --   --    GFR: Estimated Creatinine  Clearance: 27.4 mL/min (A) (by C-G formula based on SCr of 1.6 mg/dL (H)). Liver Function Tests: No results for input(s): AST, ALT, ALKPHOS, BILITOT, PROT, ALBUMIN in the last 168 hours. No results for input(s): LIPASE, AMYLASE in the last 168 hours. No results for input(s): AMMONIA in the last 168 hours. Coagulation Profile: No results for input(s): INR, PROTIME in the last 168 hours.  Cardiac Enzymes: No results for input(s): CKTOTAL, CKMB, CKMBINDEX, TROPONINI in the last 168 hours. BNP (last 3 results) No results for input(s): PROBNP in the last 8760 hours. HbA1C: No results for input(s): HGBA1C in the last 72 hours. CBG: Recent Labs  Lab 05/04/21 1731 05/04/21 2242 05/05/21 0601 05/05/21 0907 05/05/21 1251  GLUCAP 226* 280* 249* 194* 195*   Lipid Profile: No results for input(s): CHOL, HDL, LDLCALC, TRIG, CHOLHDL, LDLDIRECT in the last 72 hours. Thyroid Function Tests: No results for input(s): TSH, T4TOTAL, FREET4, T3FREE, THYROIDAB in the last 72 hours. Anemia Panel: No results for input(s): VITAMINB12, FOLATE, FERRITIN, TIBC, IRON, RETICCTPCT in the last 72 hours.  Sepsis Labs: No results for input(s): PROCALCITON, LATICACIDVEN in the last 168 hours.   Recent Results (from the past 240 hour(s))  Blood culture (routine x 2)     Status: None   Collection Time: 04/27/21 12:14 PM   Specimen: BLOOD RIGHT HAND  Result Value Ref Range Status   Specimen Description BLOOD RIGHT HAND  Final   Special Requests   Final    BOTTLES DRAWN AEROBIC AND ANAEROBIC Blood Culture adequate volume   Culture   Final    NO GROWTH 5 DAYS Performed at [facility], [address], [place], [place] [address]    Report Status 05/02/2021 FINAL  Final  Blood culture (routine x 2)     Status: None   Collection Time: 04/27/21 12:28 PM   Specimen: BLOOD LEFT ARM  Result Value Ref Range Status   Specimen Description BLOOD LEFT ARM  Final   Special Requests   Final  BOTTLES DRAWN  AEROBIC AND ANAEROBIC Blood Culture adequate volume   Culture   Final    NO GROWTH 5 DAYS Performed at [facility], [place] [address] [place], [place] [address]    Report Status 05/02/2021 FINAL  Final  Resp Panel by RT-PCR (Flu A&B, Covid) Nasopharyngeal Swab     Status: Abnormal   Collection Time: 04/27/21  4:40 PM   Specimen: Nasopharyngeal Swab; Nasopharyngeal(NP) swabs in vial transport medium  Result Value Ref Range Status   SARS Coronavirus 2 by RT PCR POSITIVE (A) NEGATIVE Final    Comment: RESULT CALLED TO, READ BACK BY AND VERIFIED WITHTish Men RN 1806 04/27/21 A BROWNING (NOTE) SARS-CoV-2 target nucleic acids are DETECTED.  The SARS-CoV-2 RNA is generally detectable in upper respiratory specimens during the acute phase of infection. Positive results are indicative of the presence of the identified virus, but do not rule out bacterial infection or co-infection with other pathogens not detected by the test. Clinical correlation with patient history and other diagnostic information is necessary to determine patient infection status. The expected result is Negative.  Fact Sheet for Patients: EntrepreneurPulse.com.au  Fact Sheet for Healthcare Providers: IncredibleEmployment.be  This test is not yet approved or cleared by the [place] FDA and  has been authorized for detection and/or diagnosis of SARS-CoV-2 by FDA under an Emergency Use Authorization (EUA).  This EUA will remain in effect (meaning this test can  be used) for the duration of  the COVID-19 declaration under Section 564(b)(1) of the Act, 21 U.S.C. section 360bbb-3(b)(1), unless the authorization is terminated or revoked sooner.     Influenza A by PCR NEGATIVE NEGATIVE Final   Influenza B by PCR NEGATIVE NEGATIVE Final    Comment: (NOTE) The Xpert Xpress SARS-CoV-2/FLU/RSV plus assay is intended as an aid in the diagnosis of influenza from Nasopharyngeal swab  specimens and should not be used as a sole basis for treatment. Nasal washings and aspirates are unacceptable for Xpert Xpress SARS-CoV-2/FLU/RSV testing.  Fact Sheet for Patients: EntrepreneurPulse.com.au  Fact Sheet for Healthcare Providers: IncredibleEmployment.be  This test is not yet approved or cleared by the [place] FDA and has been authorized for detection and/or diagnosis of SARS-CoV-2 by FDA under an Emergency Use Authorization (EUA). This EUA will remain in effect (meaning this test can be used) for the duration of the COVID-19 declaration under Section 564(b)(1) of the Act, 21 U.S.C. section 360bbb-3(b)(1), unless the authorization is terminated or revoked.  Performed at [facility], [place] [address] [place], [place] [address]   Surgical PCR screen     Status: Abnormal   Collection Time: 04/27/21  9:31 PM   Specimen: Nasal Mucosa; Nasal Swab  Result Value Ref Range Status   MRSA, PCR NEGATIVE NEGATIVE Final   Staphylococcus aureus POSITIVE (A) NEGATIVE Final    Comment: (NOTE) The Xpert SA Assay (FDA approved for NASAL specimens in patients 59 years of age and older), is one component of a comprehensive surveillance program. It is not intended to diagnose infection nor to guide or monitor treatment. Performed at [facility], [place] [address] [place], [place] [address]   Aerobic/Anaerobic Culture w Gram Stain (surgical/deep wound)     Status: None (Preliminary result)   Collection Time: 05/03/21  5:42 PM   Specimen: PATH Other; Tissue  Result Value Ref Range Status   Specimen Description WOUND  Final   Special Requests LEFT THIRD TOE SWABS FOR CULTURE SPEC A  Final   Gram Stain   Final    RARE WBC PRESENT,BOTH PMN AND MONONUCLEAR RARE GRAM POSITIVE COCCI    Culture   Final    FEW STAPHYLOCOCCUS AUREUS FEW STREPTOCOCCUS ANGINOSIS SUSCEPTIBILITIES TO FOLLOW Performed at [facility], [place] [address], [place], [place] [address]    Report Status PENDING  Incomplete      Imaging Studies   No results found.   Medications   Scheduled Meds:  ascorbic acid  250 mg Oral Q supper   aspirin EC  81 mg Oral QHS   atorvastatin  80 mg Oral Daily   brimonidine  1 drop Right Eye BID   Chlorhexidine Gluconate Cloth  6 each Topical Daily   docusate sodium  100 mg Oral Daily   insulin aspart  0-15 Units Subcutaneous TID WC   metoprolol tartrate  37.5 mg Oral BID   pantoprazole  40 mg Oral Once per day on Mon Wed Fri   polyethylene glycol  17 g Oral Daily   pyridoxine  100 mg Oral QPM   saccharomyces boulardii  250 mg Oral BID   sodium chloride flush  3 mL Intravenous Q12H   Continuous Infusions:  sodium chloride     heparin 1,350 Units/hr (05/05/21 0411)   magnesium sulfate bolus IVPB     [START ON 05/06/2021] vancomycin         LOS: 8 days    Time spent: 25 minutes with > 50% spent at bedside and in coordination of care     Ezekiel Slocumb, DO [facility]  05/05/2021, 3:10 PM      If 7PM-7AM, please contact night-coverage. How to contact the [facility] Attending or Consulting provider [place] or covering provider during after hours [place], for this patient?    Check the care team in [facility] and look for a) attending/consulting TR[facility] provider listed and b) the [facility] team listed Log into [email or web address] and use [place]'s universal password to access. If you do not have the password, please contact the hospital operator. Locate the [facility] provider you are looking for under [facility] and page to a number that you can be directly reached. If you still have difficulty reaching the provider, please page the [facility] (Director on Call) for the Hospitalists listed on amion for assistance.

## 2021-05-05 NOTE — Progress Notes (Addendum)
[facility]  Subjective  - foot feels ok   Objective (!) 134/47 78 97.6 F (36.4 C) (Oral) 11 96%  Intake/Output Summary (Last 24 hours) at 05/05/2021 0755 Last data filed at 05/04/2021 1700 Gross per 24 hour  Intake 961.67 ml  Output --  Net 961.67 ml   Left leg incisions clean no hematoma Left foot wound still with black streak on plantar aspect of foot some fibrinous exudate in wound no much granulation no pus Brisk AT doppler monophasic PT peroneal  Right foot monophasic PT doppler  Assessment/Planning: S/p left fem pop bypass patent graft Wound on left foot is marginal start wound care today, will need to reassess daily Oob ambulate today Leukocytosis continue antibiotics Rising creatinine trend for now encourage PO intake  [name] 05/05/2021 7:55 AM --  Laboratory Lab Results: Recent Labs    05/04/21 0443 05/04/21 1407 05/05/21 0247  WBC 21.1*  --  21.9*  HGB 6.9* 9.3* 8.5*  HCT 20.8* 28.0* 26.2*  PLT 312  --  350   BMET Recent Labs    05/04/21 0443 05/05/21 0247  NA 136 131*  K 4.4 3.7  CL 109 104  CO2 19* 18*  GLUCOSE 222* 281*  BUN 25* 37*  CREATININE 1.40* 1.60*  CALCIUM 7.7* 7.9*    COAG Lab Results  Component Value Date   INR 1.1 04/27/2021   INR 1.0 08/10/2020   INR 1.8 (H) 03/24/2020   No results found for: PTT

## 2021-05-05 NOTE — Plan of Care (Signed)
  Problem: Education: Goal: Knowledge of General Education information will improve Description Including pain rating scale, medication(s)/side effects and non-pharmacologic comfort measures Outcome: Progressing   

## 2021-05-05 NOTE — Progress Notes (Signed)
Pharmacy Antibiotic Note  [name] is a 80 y.o. female admitted on 04/27/2021 with  osteomyelitis . She is also noted s/p left fem pop bypass.  Pharmacy has been consulted for vancomycin dosing (today is day 9). Plans noted for 5 days from 8/11 -WBC= 21.9, afebile -SCr 1.4> a.6 -Wound cultures with staph aureus   Plan: Change vancomycin to 1250mg  IV q48hr (estimated AUC = 514) Monitor renal function, clinical status  Height: 5' 4.5" (163.8 cm) Weight: 68.5 kg (151 lb 0.2 oz) IBW/kg (Calculated) : 55.85  Temp (24hrs), Avg:97.8 F (36.6 C), Min:97.5 F (36.4 C), Max:98.2 F (36.8 C)  Recent Labs  Lab 04/30/21 1742 04/30/21 2141 05/01/21 0146 05/02/21 0141 05/03/21 0207 05/03/21 1554 05/04/21 0443 05/05/21 0247  WBC  --   --  17.0* 17.0* 27.9*  --  21.1* 21.9*  CREATININE  --   --  1.43* 1.38* 1.28* 0.90 1.40* 1.60*  VANCOTROUGH 14*  --   --   --   --   --   --   --   VANCOPEAK  --  33  --   --   --   --   --   --      Estimated Creatinine Clearance: 27.4 mL/min (A) (by C-G formula based on SCr of 1.6 mg/dL (H)).    Allergies  Allergen Reactions   Bactrim [Sulfamethoxazole-Trimethoprim] Other (See Comments)    ^ K+( elevated)    Demerol [Meperidine]     Delusional    Scopolamine     Delusional    Tramadol Other (See Comments)    Keeps awake   Versed [Midazolam] Anxiety    Frantic, out of my mind, agitated     Antimicrobials this admission: Zosyn 8/4 >> Vancomycin 8/4 >>  Dose adjustments this admission:   Microbiology results: 8/4 BCx: neg 8/10 wound swab - staph aureus, strep anginosis   Thank you for involving pharmacy in this patient's care.  Hildred Laser, PharmD Clinical Pharmacist **Pharmacist phone directory can now be found on [email or web address] ([email or web address]).  Listed under [place].

## 2021-05-05 NOTE — Progress Notes (Signed)
Physical Therapy Treatment Patient Details Name: [name] MRN: [id number] DOB: 12/28/1940 Today's Date: 05/05/2021    History of Present Illness Pt adm 8/4 with lt third toe gangrene. Pt underwent abdominal aortogram with bilateral lower extremity runoff on 8/5. Pt underwent lt 3rd toe amputation  and lt common femoral - tibioperoneal trunk bypass on 8/10. Pt with incidental covid + finding. PMH - multiple LE vascular surgeries, rt transmetatarsal amputation, HTN, CABG, DM, PVD, diabetic neuropathy    PT Comments    Pt making slow, steady progress. Pt reports that her daughter works full time so she will be unavailable during the day to assist. At this time do not feel pt can mobilize at home without assist and pt agreeable to explore SNF options.    Follow Up Recommendations  SNF     Equipment Recommendations  None recommended by PT    Recommendations for Other Services       Precautions / Restrictions Precautions Precautions: Fall Required Braces or Orthoses: Other Brace Other Brace: Darco shoe on L foot. Pt also wears Darco on R foot (for almost 2 years) Restrictions Weight Bearing Restrictions: Yes Other Position/Activity Restrictions: weight bearing on heel    Mobility  Bed Mobility Overal bed mobility: Needs Assistance Bed Mobility: Supine to Sit     Supine to sit: Supervision     General bed mobility comments: Supervision for lines    Transfers Overall transfer level: Needs assistance Equipment used: Rolling walker (2 wheeled) Transfers: Sit to/from Stand Sit to Stand: Mod assist         General transfer comment: Assist to bring hips up and for balance especially when transitioning hands to walker  Ambulation/Gait Ambulation/Gait assistance: Min assist Gait Distance (Feet): 20 Feet (5' x 1, 20' x 1) Assistive device: Rolling walker (2 wheeled) Gait Pattern/deviations: Step-to pattern;Decreased step length - right;Decreased stance time -  left;Decreased weight shift to left Gait velocity: decr Gait velocity interpretation: <1.31 ft/sec, indicative of household ambulator General Gait Details: Assist for balance and support. Verbal cues to incr rt step length   Stairs             Wheelchair Mobility    Modified Rankin (Stroke Patients Only)       Balance Overall balance assessment: Needs assistance Sitting-balance support: No upper extremity supported;Feet unsupported Sitting balance-Leahy Scale: Good     Standing balance support: Bilateral upper extremity supported Standing balance-Leahy Scale: Poor Standing balance comment: walker and min assist for static standing                            Cognition Arousal/Alertness: Awake/alert Behavior During Therapy: WFL for tasks assessed/performed Overall Cognitive Status: Within Functional Limits for tasks assessed                                        Exercises      General Comments General comments (skin integrity, edema, etc.): VSS on RA      Pertinent Vitals/Pain Pain Assessment: Faces Faces Pain Scale: Hurts little more Pain Location: LLE Pain Descriptors / Indicators: Tightness;Grimacing;Guarding Pain Intervention(s): Limited activity within patient's tolerance;Repositioned;Monitored during session    Home Living                      Prior Function  PT Goals (current goals can now be found in the care plan section) Progress towards PT goals: Progressing toward goals    Frequency    Min 3X/week      PT Plan Discharge plan needs to be updated    Co-evaluation              AM-PAC PT "6 Clicks" Mobility   Outcome Measure  Help needed turning from your back to your side while in a flat bed without using bedrails?: None Help needed moving from lying on your back to sitting on the side of a flat bed without using bedrails?: None Help needed moving to and from a bed to a chair  (including a wheelchair)?: A Little Help needed standing up from a chair using your arms (e.g., wheelchair or bedside chair)?: A Lot Help needed to walk in hospital room?: A Little Help needed climbing 3-5 steps with a railing? : Total 6 Click Score: 17    End of Session Equipment Utilized During Treatment: Gait belt Activity Tolerance: Patient tolerated treatment well Patient left: in chair;with call bell/phone within reach;with chair alarm set Nurse Communication: Mobility status PT Visit Diagnosis: Unsteadiness on feet (R26.81);Other abnormalities of gait and mobility (R26.89);Pain Pain - Right/Left: Left Pain - part of body: Leg     Time: 4332-9518 PT Time Calculation (min) (ACUTE ONLY): 25 min  Charges:  $Gait Training: 23-37 mins                     [place] Pager [phone] Office [place] 05/05/2021, 10:40 AM

## 2021-05-05 NOTE — Progress Notes (Signed)
[place] for heparin Indication: L common femoral-tibioperoneal trunk bypass   Allergies  Allergen Reactions   Bactrim [Sulfamethoxazole-Trimethoprim] Other (See Comments)    ^ K+( elevated)    Demerol [Meperidine]     Delusional    Scopolamine     Delusional    Tramadol Other (See Comments)    Keeps awake   Versed [Midazolam] Anxiety    Frantic, out of my mind, agitated     Patient Measurements: Height: 5' 4.5" (163.8 cm) Weight: 68.5 kg (151 lb 0.2 oz) IBW/kg (Calculated) : 55.85 Heparin Dosing Weight: 68kg  Vital Signs: Temp: 98.2 F (36.8 C) (08/12 0025) Temp Source: Oral (08/12 0025) BP: 128/48 (08/12 0025) Pulse Rate: 77 (08/12 0025)  Labs: Recent Labs    05/03/21 0207 05/03/21 1554 05/04/21 0443 05/04/21 1407 05/04/21 1559 05/05/21 0247  HGB 9.7* 7.8* 6.9* 9.3*  --  8.5*  HCT 29.7* 23.0* 20.8* 28.0*  --  26.2*  PLT 434*  --  312  --   --  350  HEPARINUNFRC  --   --   --   --  <0.10* 0.13*  CREATININE 1.28* 0.90 1.40*  --   --  1.60*     Estimated Creatinine Clearance: 27.4 mL/min (A) (by C-G formula based on SCr of 1.6 mg/dL (H)).   Medical History: Past Medical History:  Diagnosis Date   Anemia    after CABG in june 2021   Arthritis    Cancer ([place])    removal from nose - MOSE procedure    Complication of anesthesia    VERSED- agitated, muscle spasms, "jerking" , frantic , (never had this occurence in the pas)    Coronary artery disease    Diabetes mellitus without complication ([place])    Dysrhythmia    PVC's   GERD (gastroesophageal reflux disease)    Heart murmur    History of hiatal hernia    Hyperlipidemia    Hypertension 11/20/11   ECHO- EF>55% Borderline concentric left ventricular hypertrophy. There is a small calcified mass in the L:A near the LA appendage. No valvular masses seen with associated mitral annular calcification. LA Volume/ BSA27.4 ml/m2 No AS. Right ventricular systolic pressure is  elevated at 80mmHg.   Myocardial infarction [facility])    June 2021   Neuromuscular disorder ([place])    neuropathy in bilateral feet   Peripheral vascular disease ([place])    Pneumonia    not hosp.    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  PTCAs with drug coated balloon Lt ant tibial artery and Lt popliteal artery  11/26/2019     Assessment: 80 yo W admitted with toe gangrene now s/p left common femoral - tibioperoneal trunk bypass with 96mm ringed ePTFE in anatomic tunnel and toe amputation. Patient was on heparin 500 units/hr no titration postop. No anticoagulation prior to admission. Pharmacy consulted for therapeutic heparin to promote graft patency. Per [facility], planning eventual transition to [place].   8/12 AM update:  Heparin level sub-therapeutic but trending up  Goal of Therapy:  Heparin level 0.3-0.7 units/ml Monitor platelets by anticoagulation protocol: Yes   Plan:  Inc heparin to 1350 units/hr 1200 heparin level  Narda Bonds, PharmD, BCPS Clinical Pharmacist Phone: [phone]

## 2021-05-05 NOTE — TOC Initial Note (Addendum)
Transition of Care [facility]) - Initial/Assessment Note    Patient Details  Name: [name] MRN: [id number] Date of Birth: 1941-05-06  Transition of Care [facility]) CM/SW Contact:    Tom-Johnson, Renea Ee, RN Phone Number: 05/05/2021, 3:10 PM  Clinical Narrative:                 [facility] consulted for SNF placement at discharge. Patient states she is willing to go to SNF for rehab before going home. Agreed to be faxed out. FL2 done. Patient verified home address, lives with daughter who works during the day time. Verifies she has a cane, walker and bedside commode at home. Admitted for left to gangrene but stated she is having pains to her right foot. Advised her to notify nurse and MD. Patient has been vaccinated. TOC will continue to follow for discharge needs.  Expected Discharge Plan: Skilled Nursing Facility Barriers to Discharge: Continued Medical Work up   Patient Goals and CMS Choice Patient states their goals for this hospitalization and ongoing recovery are:: To get better and go home. CMS Medicare.gov Compare Post Acute Care list provided to:: Patient Choice offered to / list presented to : Patient  Expected Discharge Plan and Services Expected Discharge Plan: [place] In-house Referral: Clinical Social Work Discharge Planning Services: CM Consult Post Acute Care Choice: [place] Living arrangements for the past 2 months: [place]                 DME Arranged: N/A                    Prior Living Arrangements/Services Living arrangements for the past 2 months: Single Family Home Lives with:: Adult Children (daughter) Patient language and need for interpreter reviewed:: Yes Do you feel safe going back to the place where you live?: Yes      Need for Family Participation in Patient Care: Yes (Comment) Care giver support system in place?: Yes (comment) Current home services: DME Criminal Activity/Legal Involvement Pertinent  to Current Situation/Hospitalization: No - Comment as needed  Activities of Daily Living Home Assistive Devices/Equipment: Cane (specify quad or straight) ADL Screening (condition at time of admission) Patient's cognitive ability adequate to safely complete daily activities?: Yes Is the patient deaf or have difficulty hearing?: No Does the patient have difficulty seeing, even when wearing glasses/contacts?: No Does the patient have difficulty concentrating, remembering, or making decisions?: No Patient able to express need for assistance with ADLs?: Yes Does the patient have difficulty dressing or bathing?: No Independently performs ADLs?: Yes (appropriate for developmental age) Does the patient have difficulty walking or climbing stairs?: No Weakness of Legs: None Weakness of Arms/Hands: None  Permission Sought/Granted Permission sought to share information with : Case Manager, Customer service manager Permission granted to share information with : Yes, Verbal Permission Granted              Emotional Assessment Appearance:: Appears stated age Attitude/Demeanor/Rapport: Engaged Affect (typically observed): Accepting Orientation: : Oriented to Self, Oriented to Place, Oriented to  Time, Oriented to Situation   Psych Involvement: No (comment)  Admission diagnosis:  Cellulitis of left lower extremity [L03.116] Osteomyelitis of foot, left, acute ([place]) [M86.172] Acute hematogenous osteomyelitis of left foot ([place]) [M86.072] Patient Active Problem List   Diagnosis Date Noted   Osteomyelitis of foot, left, acute ([place]) 04/27/2021   Cellulitis in diabetic foot ([place]) 04/27/2021   Acute kidney injury superimposed on chronic kidney disease ([place]) 04/27/2021  Hyponatremia 04/27/2021   Anemia, chronic disease 04/27/2021   Cellulitis of left lower extremity    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) 03/31/2013   Overweight (BMI 25.0-29.9) 03/31/2013   Carotid artery disease ([place]) 03/31/2013   PAD (peripheral artery disease) ([place]) 03/31/2013   PCP:  [name], MD Pharmacy:   [facility] [address] [place] - [place] [address] [place] [place] [address] Phone: [phone] Fax: [phone]     Social Determinants of Health (SDOH) Interventions    Readmission Risk Interventions Readmission Risk Prevention Plan 07/22/2019  Post Dischage Appt Complete  Medication Screening Complete  Transportation Screening Complete  Some recent data might be hidden

## 2021-05-06 DIAGNOSIS — M86172 Other acute osteomyelitis, left ankle and foot: Secondary | ICD-10-CM | POA: Diagnosis not present

## 2021-05-06 LAB — GLUCOSE, CAPILLARY
Glucose-Capillary: 174 mg/dL — ABNORMAL HIGH (ref 70–99)
Glucose-Capillary: 170 mg/dL — ABNORMAL HIGH (ref 70–99)
Glucose-Capillary: 187 mg/dL — ABNORMAL HIGH (ref 70–99)

## 2021-05-06 LAB — HEPARIN LEVEL (UNFRACTIONATED)
Heparin Unfractionated: 0.51 IU/mL (ref 0.30–0.70)
Heparin Unfractionated: 0.6 IU/mL (ref 0.30–0.70)

## 2021-05-06 MED ORDER — VANCOMYCIN HCL 750 MG/150ML IV SOLN
750.0000 mg | INTRAVENOUS | Status: DC
  Administered 2021-05-06: 750 mg via INTRAVENOUS
  Filled 2021-05-06 (×2): qty 150

## 2021-05-06 NOTE — Progress Notes (Addendum)
Pharmacy Antibiotic Note  [name] is a 80 y.o. female admitted on 04/27/2021 with  osteomyelitis . She is also noted s/p left fem pop bypass.  Pharmacy has been consulted for vancomycin dosing (today is day 10).  Plans noted for 5 days from 8/11. AM labs drawn at 2300 - WBC trending down (20.8), afebrile. SCr back down 1.48. Wound cultures growing staph aureus, strep anginosis - pending susceptibilities.   Plan: Change vancomycin to 750 mg IV q24h (eAUC = 564, SCr 1.48)  Monitor renal function, clinical status Follow cultures and susceptibilities  Height: 5' 4.5" (163.8 cm) Weight: 68.5 kg (151 lb 0.2 oz) IBW/kg (Calculated) : 55.85  Temp (24hrs), Avg:97.9 F (36.6 C), Min:97.7 F (36.5 C), Max:98.1 F (36.7 C)  Recent Labs  Lab 04/30/21 1742 04/30/21 2141 05/01/21 0146 05/02/21 0141 05/03/21 0207 05/03/21 1554 05/04/21 0443 05/05/21 0247 05/05/21 2252  WBC  --   --    < > 17.0* 27.9*  --  21.1* 21.9* 20.8*  CREATININE  --   --    < > 1.38* 1.28* 0.90 1.40* 1.60* 1.48*  VANCOTROUGH 14*  --   --   --   --   --   --   --   --   VANCOPEAK  --  33  --   --   --   --   --   --   --    < > = values in this interval not displayed.     Estimated Creatinine Clearance: 29.6 mL/min (A) (by C-G formula based on SCr of 1.48 mg/dL (H)).    Allergies  Allergen Reactions   Bactrim [Sulfamethoxazole-Trimethoprim] Other (See Comments)    ^ K+( elevated)    Demerol [Meperidine]     Delusional    Scopolamine     Delusional    Tramadol Other (See Comments)    Keeps awake   Versed [Midazolam] Anxiety    Frantic, out of my mind, agitated     Antimicrobials this admission: Vancomycin 8/4 >> Zosyn 8/4 >> 8/11  Dose adjustments this admission:  Microbiology results: 8/4 BCx: neg 8/4 MRSA: + 8/10 Wound Cx - staph aureus, strep anginosis  Thank you for involving pharmacy in this patient's care.  Laurey Arrow, PharmD PGY1 Pharmacy Resident 05/06/2021  8:40 AM  Please check  [email or web address] for unit-specific pharmacy phone numbers.

## 2021-05-06 NOTE — Progress Notes (Signed)
PROGRESS NOTE    [name]   MPN:[id number]  DOB: 04-Jan-1941  PCP: [name], MD    DOA: 04/27/2021 LOS: 9   Assessment & Plan   Principal Problem:   Osteomyelitis of foot, left, acute ([place]) Active Problems:   Essential hypertension   PAD (peripheral artery disease) ([place])   Cellulitis in diabetic foot ([place])   Acute kidney injury superimposed on chronic kidney disease ([place])   Hyponatremia   Anemia, chronic disease   Cellulitis of left lower extremity   Left great toe gangrene Sepsis due to osteomyelitis, cellulitis -sepsis POA.  Sepsis physiology has improved. Status post angiogram on 8/5 showing severe left superficial femoral artery occlusive disease with 3 cm occlusion of the left popliteal artery, 80% stenosis of the distal right external iliac artery above existing femoropopliteal bypass --Continue IV vancomycin  -- On heparin drip for graft patency --Stop Zosyn given cultures with GPC's (8/11) --Blood cultures negative --Vascular surgery and podiatry are consulted Vascular Recs: OOB/mobilize Wet-to-dry dressing changes BID  May transition to [facility] once infection cleared. Cultures pending. Continue antibiotics x 5 days. PT / OT / OOB / Ambulate with heel weightbearing --Hold Plavix until okay from surgical standpoint  Acute kidney injury -due to prerenal azotemia, had improved but creatinine now rising some again.  Suspect due to poor p.o. intake --Monitor BMP --Encourage p.o. intake and will run some fluids if needed tomorrow if still worsening  Hyponatremia -mild, sodium 131 on morning labs.  Suspect poor p.o. intake.  We will hold off on IV fluids for now and encourage p.o. intake.  IV fluids tomorrow if not improving.  COVID-19 infection -incidentally positive and asymptomatic. --Monitor clinically --Airborne isolation x10 days  Essential hypertension -continue metoprolol.  HCTZ and myocarditis are on hold.  Type 2 diabetes -uncontrolled with  A1c 7.7 in April. --Hold oral meds --Sliding scale NovoLog for now  Chronic iron deficiency anemia -iron panel consistent with severe iron deficiency. -- Consider IV iron infusion postoperatively  CAD status post CABG in 03/2020 -continue aspirin and statin.  Plavix on hold.  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  Continue aspirin and statin.  Plavix on hold.   Patient BMI: Body mass index is 25.52 kg/m.   DVT prophylaxis: heparin 5000 UNIT/ML injection Start: 05/05/21 1705 SCD's Start: 05/03/21 2005  Lovenox held for surgery   Diet:  Diet Orders (From admission, onward)     Start     Ordered   05/05/21 2219  Diet Carb Modified Fluid consistency: Thin; Room service appropriate? Yes  Diet effective now       Question Answer Comment  Diet-HS Snack? Nothing   Calorie Level Medium 1600-2000   Fluid consistency: Thin   Room service appropriate? Yes      05/05/21 2218              Code Status: Full Code   Brief Narrative / Hospital Course to Date:   "[name] is a 80 y.o. female with medical history significant of hypertension, hyperlipidemia, CAD s/p CABG in 2021, PVD s/p bypass grafting, diabetes mellitus type II, ray amputation of right foot presented with left third toe wound.  developed redness pain and streaking going up her left leg associated with fevers and chills . She is s/p right common femoral endarterectomy with femoral popliteal bypass graft on 08/16/2020.  And previously had angioplasty with stent continue to have the left SFA. In the  ED she was febrile, mildly tachycardic, WBC was 20.8K, x-ray of the left foot noted osteomyelitis of distal phalanx third toe. -Was also incidentally positive for COVID, is asymptomatic -Vascular following, underwent angiogram on 8/5, noted to have severe left superficial femoral artery occlusive disease, plan for left fem-pop bypass tomorrow. Improving on antibiotics, still  with intermittent fevers"  Subjective 05/06/21    Patient up in recliner when seen today.  She reports not needing any pain medications.  Only has significant pain with moving around but it resolves on its own.  She mentions that her daughter works and she may need to go to a SNF instead of home initially.   Disposition Plan & Communication   Status is: Inpatient  Remains inpatient appropriate because: Further procedures are planned as outlined above  Dispo: The patient is from: Home              Anticipated d/c is to: Home              Patient currently is not medically stable to d/c.   Difficult to place patient No    Consults, Procedures, Significant Events   Consultants:  Vascular surgery Podiatry  Procedures:  8/5 - Ultrasound right groin 8/5 - Abdominal aortogram with bilateral lower extremity runoff 8/10 - left common femoral - tibioperoneal trunk bypass with 53mm ringed ePTFE in anatomic tunnel 8/10 - left third toe open amputation  Antimicrobials:  Anti-infectives (From admission, onward)    Start     Dose/Rate Route Frequency Ordered Stop   05/06/21 0930  vancomycin (VANCOREADY) IVPB 750 mg/150 mL        750 mg 150 mL/hr over 60 Minutes Intravenous Every 24 hours 05/06/21 0841     05/06/21 0900  vancomycin (VANCOREADY) IVPB 1250 mg/250 mL  Status:  Discontinued        1,250 mg 166.7 mL/hr over 90 Minutes Intravenous Every 48 hours 05/05/21 1506 05/06/21 0841   04/28/21 1800  vancomycin (VANCOREADY) IVPB 750 mg/150 mL  Status:  Discontinued        750 mg 150 mL/hr over 60 Minutes Intravenous Every 24 hours 04/28/21 1324 05/05/21 1506   04/28/21 0030  piperacillin-tazobactam (ZOSYN) IVPB 3.375 g  Status:  Discontinued        3.375 g 12.5 mL/hr over 240 Minutes Intravenous Every 8 hours 04/27/21 1650 05/04/21 1358   04/27/21 1603  vancomycin variable dose per unstable renal function (pharmacist dosing)  Status:  Discontinued         Does not apply See admin  instructions 04/27/21 1604 04/28/21 1324   04/27/21 1545  piperacillin-tazobactam (ZOSYN) IVPB 3.375 g        3.375 g 100 mL/hr over 30 Minutes Intravenous  Once 04/27/21 1532 04/27/21 1707   04/27/21 1545  vancomycin (VANCOREADY) IVPB 1250 mg/250 mL        1,250 mg 166.7 mL/hr over 90 Minutes Intravenous  Once 04/27/21 1532 04/28/21 0305         Micro    Objective   Vitals:   05/05/21 1945 05/05/21 2305 05/06/21 0520 05/06/21 0914  BP: (!) 158/57 (!) 148/39 (!) 127/51 (!) 166/73  Pulse:    97  Resp: 16 18 18 12   Temp: 98 F (36.7 C) 98.1 F (36.7 C) 97.7 F (36.5 C) 97.9 F (36.6 C)  TempSrc: Oral Oral Oral Oral  SpO2: 100% 97% 100% 99%  Weight:      Height:  Intake/Output Summary (Last 24 hours) at 05/06/2021 1450 Last data filed at 05/06/2021 3220 Gross per 24 hour  Intake 523.89 ml  Output 1575 ml  Net -1051.11 ml   Filed Weights   04/27/21 1500  Weight: 68.5 kg    Physical Exam:  General exam: in recliner, awake, alert, no mild distress due to pain Respiratory system: CTAB, normal respiratory effort, on room air. Cardiovascular system: RRR, no pedal edema.   Central nervous system: A&O x4. no gross focal neurologic deficits, normal speech Skin: dry, intact, normal temp and color Psychiatry: normal mood, congruent affect, judgement and insight appear normal  Labs   Data Reviewed: I have personally reviewed following labs and imaging studies  CBC: Recent Labs  Lab 05/02/21 0141 05/03/21 0207 05/03/21 1554 05/04/21 0443 05/04/21 1407 05/05/21 0247 05/05/21 2252  WBC 17.0* 27.9*  --  21.1*  --  21.9* 20.8*  HGB 8.4* 9.7* 7.8* 6.9* 9.3* 8.5* 8.5*  HCT 25.0* 29.7* 23.0* 20.8* 28.0* 26.2* 25.9*  MCV 86.8 90.3  --  89.7  --  88.5 88.7  PLT 328 434*  --  312  --  350 254   Basic Metabolic Panel: Recent Labs  Lab 05/01/21 0146 05/02/21 0141 05/03/21 0207 05/03/21 1554 05/04/21 0443 05/05/21 0247 05/05/21 2252  NA 136 133* 135 137 136  131* 133*  K 3.3* 3.6 3.3* 3.6 4.4 3.7 3.6  CL 106 105 103 107 109 104 106  CO2 19* 18* 19*  --  19* 18* 17*  GLUCOSE 156* 145* 155* 114* 222* 281* 202*  BUN 23 20 18 16  25* 37* 37*  CREATININE 1.43* 1.38* 1.28* 0.90 1.40* 1.60* 1.48*  CALCIUM 8.2* 8.1* 8.6*  --  7.7* 7.9* 8.0*  MG 2.0  --   --   --   --   --   --    GFR: Estimated Creatinine Clearance: 29.6 mL/min (A) (by C-G formula based on SCr of 1.48 mg/dL (H)). Liver Function Tests: No results for input(s): AST, ALT, ALKPHOS, BILITOT, PROT, ALBUMIN in the last 168 hours. No results for input(s): LIPASE, AMYLASE in the last 168 hours. No results for input(s): AMMONIA in the last 168 hours. Coagulation Profile: No results for input(s): INR, PROTIME in the last 168 hours.  Cardiac Enzymes: No results for input(s): CKTOTAL, CKMB, CKMBINDEX, TROPONINI in the last 168 hours. BNP (last 3 results) No results for input(s): PROBNP in the last 8760 hours. HbA1C: No results for input(s): HGBA1C in the last 72 hours. CBG: Recent Labs  Lab 05/05/21 0907 05/05/21 1251 05/05/21 1700 05/05/21 2307 05/06/21 0653  GLUCAP 194* 195* 208* 198* 174*   Lipid Profile: No results for input(s): CHOL, HDL, LDLCALC, TRIG, CHOLHDL, LDLDIRECT in the last 72 hours. Thyroid Function Tests: No results for input(s): TSH, T4TOTAL, FREET4, T3FREE, THYROIDAB in the last 72 hours. Anemia Panel: No results for input(s): VITAMINB12, FOLATE, FERRITIN, TIBC, IRON, RETICCTPCT in the last 72 hours.  Sepsis Labs: No results for input(s): PROCALCITON, LATICACIDVEN in the last 168 hours.   Recent Results (from the past 240 hour(s))  Blood culture (routine x 2)     Status: None   Collection Time: 04/27/21 12:14 PM   Specimen: BLOOD RIGHT HAND  Result Value Ref Range Status   Specimen Description BLOOD RIGHT HAND  Final   Special Requests   Final    BOTTLES DRAWN AEROBIC AND ANAEROBIC Blood Culture adequate volume   Culture   Final    NO GROWTH 5  DAYS Performed at [facility], [place] [address] [place], [place] [address]    Report Status 05/02/2021 FINAL  Final  Blood culture (routine x 2)     Status: None   Collection Time: 04/27/21 12:28 PM   Specimen: BLOOD LEFT ARM  Result Value Ref Range Status   Specimen Description BLOOD LEFT ARM  Final   Special Requests   Final    BOTTLES DRAWN AEROBIC AND ANAEROBIC Blood Culture adequate volume   Culture   Final    NO GROWTH 5 DAYS Performed at [facility], [place] [address] [place], [place] [address]    Report Status 05/02/2021 FINAL  Final  Resp Panel by RT-PCR (Flu A&B, Covid) Nasopharyngeal Swab     Status: Abnormal   Collection Time: 04/27/21  4:40 PM   Specimen: Nasopharyngeal Swab; Nasopharyngeal(NP) swabs in vial transport medium  Result Value Ref Range Status   SARS Coronavirus 2 by RT PCR POSITIVE (A) NEGATIVE Final    Comment: RESULT CALLED TO, READ BACK BY AND VERIFIED WITHTish Men RN 1806 04/27/21 A BROWNING (NOTE) SARS-CoV-2 target nucleic acids are DETECTED.  The SARS-CoV-2 RNA is generally detectable in upper respiratory specimens during the acute phase of infection. Positive results are indicative of the presence of the identified virus, but do not rule out bacterial infection or co-infection with other pathogens not detected by the test. Clinical correlation with patient history and other diagnostic information is necessary to determine patient infection status. The expected result is Negative.  Fact Sheet for Patients: EntrepreneurPulse.com.au  Fact Sheet for Healthcare Providers: IncredibleEmployment.be  This test is not yet approved or cleared by the [place] FDA and  has been authorized for detection and/or diagnosis of SARS-CoV-2 by FDA under an Emergency Use Authorization (EUA).  This EUA will remain in effect (meaning this test can  be used) for the duration of  the COVID-19 declaration under  Section 564(b)(1) of the Act, 21 U.S.C. section 360bbb-3(b)(1), unless the authorization is terminated or revoked sooner.     Influenza A by PCR NEGATIVE NEGATIVE Final   Influenza B by PCR NEGATIVE NEGATIVE Final    Comment: (NOTE) The Xpert Xpress SARS-CoV-2/FLU/RSV plus assay is intended as an aid in the diagnosis of influenza from Nasopharyngeal swab specimens and should not be used as a sole basis for treatment. Nasal washings and aspirates are unacceptable for Xpert Xpress SARS-CoV-2/FLU/RSV testing.  Fact Sheet for Patients: EntrepreneurPulse.com.au  Fact Sheet for Healthcare Providers: IncredibleEmployment.be  This test is not yet approved or cleared by the [place] FDA and has been authorized for detection and/or diagnosis of SARS-CoV-2 by FDA under an Emergency Use Authorization (EUA). This EUA will remain in effect (meaning this test can be used) for the duration of the COVID-19 declaration under Section 564(b)(1) of the Act, 21 U.S.C. section 360bbb-3(b)(1), unless the authorization is terminated or revoked.  Performed at [facility], [place] [address], [place], [place] [address]   Surgical PCR screen     Status: Abnormal   Collection Time: 04/27/21  9:31 PM   Specimen: Nasal Mucosa; Nasal Swab  Result Value Ref Range Status   MRSA, PCR NEGATIVE NEGATIVE Final   Staphylococcus aureus POSITIVE (A) NEGATIVE Final    Comment: (NOTE) The Xpert SA Assay (FDA approved for NASAL specimens in patients 67 years of age and older), is one component of a comprehensive surveillance program. It is not intended to diagnose infection nor to guide or  monitor treatment. Performed at [facility], [place] [address] [place], [place] [address]   Aerobic/Anaerobic Culture w Gram Stain (surgical/deep wound)     Status: None (Preliminary result)   Collection Time: 05/03/21  5:42 PM   Specimen: PATH Other; Tissue  Result Value Ref  Range Status   Specimen Description WOUND  Final   Special Requests LEFT THIRD TOE SWABS FOR CULTURE SPEC A  Final   Gram Stain   Final    RARE WBC PRESENT,BOTH PMN AND MONONUCLEAR RARE GRAM POSITIVE COCCI Performed at [facility], [place] [address], [place], [place] [address]    Culture   Final    FEW STAPHYLOCOCCUS AUREUS FEW STREPTOCOCCUS ANGINOSIS NO ANAEROBES ISOLATED; CULTURE IN PROGRESS FOR 5 DAYS    Report Status PENDING  Incomplete   Organism ID, Bacteria STAPHYLOCOCCUS AUREUS  Final   Organism ID, Bacteria STREPTOCOCCUS ANGINOSIS  Final      Susceptibility   Staphylococcus aureus - MIC*    CIPROFLOXACIN <=0.5 SENSITIVE Sensitive     ERYTHROMYCIN >=8 RESISTANT Resistant     GENTAMICIN <=0.5 SENSITIVE Sensitive     OXACILLIN 0.5 SENSITIVE Sensitive     TETRACYCLINE <=1 SENSITIVE Sensitive     VANCOMYCIN 1 SENSITIVE Sensitive     TRIMETH/SULFA <=10 SENSITIVE Sensitive     CLINDAMYCIN RESISTANT Resistant     RIFAMPIN <=0.5 SENSITIVE Sensitive     Inducible Clindamycin POSITIVE Resistant     * FEW STAPHYLOCOCCUS AUREUS   Streptococcus anginosis - MIC*    PENICILLIN <=0.06 SENSITIVE Sensitive     CEFTRIAXONE 0.25 SENSITIVE Sensitive     ERYTHROMYCIN <=0.12 SENSITIVE Sensitive     LEVOFLOXACIN 0.5 SENSITIVE Sensitive     VANCOMYCIN 0.5 SENSITIVE Sensitive     * FEW STREPTOCOCCUS ANGINOSIS      Imaging Studies   No results found.   Medications   Scheduled Meds:  ascorbic acid  250 mg Oral Q supper   aspirin EC  81 mg Oral QHS   atorvastatin  80 mg Oral Daily   brimonidine  1 drop Right Eye BID   Chlorhexidine Gluconate Cloth  6 each Topical Daily   docusate sodium  100 mg Oral Daily   insulin aspart  0-15 Units Subcutaneous TID WC   metoprolol tartrate  37.5 mg Oral BID   pantoprazole  40 mg Oral Once per day on Mon Wed Fri   polyethylene glycol  17 g Oral Daily   pyridoxine  100 mg Oral QPM   saccharomyces boulardii  250 mg Oral BID   sodium chloride  flush  3 mL Intravenous Q12H   Continuous Infusions:  sodium chloride     heparin 1,650 Units/hr (05/06/21 0628)   magnesium sulfate bolus IVPB     vancomycin 750 mg (05/06/21 1116)       LOS: 9 days    Time spent: 25 minutes with > 50% spent at bedside and in coordination of care     Ezekiel Slocumb, DO [facility]  05/06/2021, 2:50 PM      If 7PM-7AM, please contact night-coverage. How to contact the [facility] Attending or Consulting provider [place] or covering provider during after hours [place], for this patient?    Check the care team in [facility] and look for a) attending/consulting TR[facility] provider listed and b) the [facility] team listed Log into [email or web address] and use [place]'s universal password to access. If you do not have the password, please contact  the hospital operator. Locate the [facility] provider you are looking for under [facility] and page to a number that you can be directly reached. If you still have difficulty reaching the provider, please page the [facility] (Director on Call) for the Hospitalists listed on amion for assistance.  PROGRESS NOTE    [name]   BJS:[id number]  DOB: [birth year]  PCP: [name], MD    DOA: 04/27/2021 LOS: 9   Assessment & Plan   Principal Problem:   Osteomyelitis of foot, left, acute ([place]) Active Problems:   Essential hypertension   PAD (peripheral artery disease) ([place])   Cellulitis in diabetic foot ([place])   Acute kidney injury superimposed on chronic kidney disease ([place])   Hyponatremia   Anemia, chronic disease   Cellulitis of left lower extremity   Left great toe gangrene Sepsis due to osteomyelitis, cellulitis -sepsis POA.  Sepsis physiology has improved. Status post angiogram on 8/5 showing severe left superficial femoral artery occlusive disease with 3 cm occlusion of the left popliteal artery, 80% stenosis of the distal right external iliac artery above existing femoropopliteal bypass --Continue IV  vancomycin  -- On heparin drip for graft patency --Stop Zosyn given cultures with GPC's (8/11) --Blood cultures negative --Vascular surgery and podiatry are consulted Vascular Recs: OOB/mobilize Wet-to-dry dressing changes BID  May transition to [facility] once infection cleared. Cultures pending. Continue antibiotics x 5 days. PT / OT / OOB / Ambulate with heel weightbearing --Hold Plavix until okay from surgical standpoint  Acute kidney injury - due to prerenal azotemia, had improved but creatinine now rising some again.  Suspect due to poor p.o. intake.  Also having urinary retention.  Cr 8/13 improved somewhat after Foley placed 8/12. --Monitor BMP --Foley for now --Encourage p.o. intake and will run some fluids if needed tomorrow if still worsening  Acute urinary retention -Foley placed on 05/05/2021 after patient requiring and out cath and still having retention on bladder scan. -- Voiding trial in the next day or two  Hyponatremia -mild, sodium 131 on morning labs.  Suspect poor p.o. intake.  We will hold off on IV fluids for now and encourage p.o. intake.  IV fluids tomorrow if not improving.  COVID-19 infection -incidentally positive and asymptomatic. --Monitor clinically --Airborne isolation x10 days  Essential hypertension -continue metoprolol.  HCTZ and myocarditis are on hold.  Type 2 diabetes -uncontrolled with A1c 7.7 in April. --Hold oral meds --Sliding scale NovoLog for now  Chronic iron deficiency anemia -iron panel consistent with severe iron deficiency. -- Consider IV iron infusion postoperatively  CAD status post CABG in 03/2020 -continue aspirin and statin.  Plavix on hold.  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  Continue aspirin and statin.  Plavix on hold.   Patient BMI: Body mass index is 25.52 kg/m.   DVT prophylaxis: heparin 5000 UNIT/ML injection Start: 05/05/21 1705 SCD's Start: 05/03/21 2005  Lovenox  held for surgery   Diet:  Diet Orders (From admission, onward)     Start     Ordered   05/05/21 2219  Diet Carb Modified Fluid consistency: Thin; Room service appropriate? Yes  Diet effective now       Question Answer Comment  Diet-HS Snack? Nothing   Calorie Level Medium 1600-2000   Fluid consistency: Thin   Room service appropriate? Yes      05/05/21 2218              Code Status:  Full Code   Brief Narrative / Hospital Course to Date:   "[name] is a 79 y.o. female with medical history significant of hypertension, hyperlipidemia, CAD s/p CABG in 2021, PVD s/p bypass grafting, diabetes mellitus type II, ray amputation of right foot presented with left third toe wound.  developed redness pain and streaking going up her left leg associated with fevers and chills . She is s/p right common femoral endarterectomy with femoral popliteal bypass graft on 08/16/2020.  And previously had angioplasty with stent continue to have the left SFA. In the ED she was febrile, mildly tachycardic, WBC was 20.8K, x-ray of the left foot noted osteomyelitis of distal phalanx third toe. -Was also incidentally positive for COVID, is asymptomatic -Vascular following, underwent angiogram on 8/5, noted to have severe left superficial femoral artery occlusive disease, plan for left fem-pop bypass tomorrow. Improving on antibiotics, still with intermittent fevers"  Subjective 05/06/21    Patient awake laying in bed when seen this morning.  Says she is feeling fine today.  Has not had any significant pain.  Vascular surgeon this morning told her everything looked like doing well.  Regarding urinary retention, she says she lacks any sensation of the need to void.  No other acute complaints.   Disposition Plan & Communication   Status is: Inpatient  Remains inpatient appropriate because: Further procedures are planned as outlined above  Dispo: The patient is from: Home              Anticipated d/c is  to: Home              Patient currently is not medically stable to d/c.   Difficult to place patient No    Consults, Procedures, Significant Events   Consultants:  Vascular surgery Podiatry  Procedures:  8/5 - Ultrasound right groin 8/5 - Abdominal aortogram with bilateral lower extremity runoff 8/10 - left common femoral - tibioperoneal trunk bypass with 63mm ringed ePTFE in anatomic tunnel 8/10 - left third toe open amputation  Antimicrobials:  Anti-infectives (From admission, onward)    Start     Dose/Rate Route Frequency Ordered Stop   05/06/21 0930  vancomycin (VANCOREADY) IVPB 750 mg/150 mL        750 mg 150 mL/hr over 60 Minutes Intravenous Every 24 hours 05/06/21 0841     05/06/21 0900  vancomycin (VANCOREADY) IVPB 1250 mg/250 mL  Status:  Discontinued        1,250 mg 166.7 mL/hr over 90 Minutes Intravenous Every 48 hours 05/05/21 1506 05/06/21 0841   04/28/21 1800  vancomycin (VANCOREADY) IVPB 750 mg/150 mL  Status:  Discontinued        750 mg 150 mL/hr over 60 Minutes Intravenous Every 24 hours 04/28/21 1324 05/05/21 1506   04/28/21 0030  piperacillin-tazobactam (ZOSYN) IVPB 3.375 g  Status:  Discontinued        3.375 g 12.5 mL/hr over 240 Minutes Intravenous Every 8 hours 04/27/21 1650 05/04/21 1358   04/27/21 1603  vancomycin variable dose per unstable renal function (pharmacist dosing)  Status:  Discontinued         Does not apply See admin instructions 04/27/21 1604 04/28/21 1324   04/27/21 1545  piperacillin-tazobactam (ZOSYN) IVPB 3.375 g        3.375 g 100 mL/hr over 30 Minutes Intravenous  Once 04/27/21 1532 04/27/21 1707   04/27/21 1545  vancomycin (VANCOREADY) IVPB 1250 mg/250 mL        1,250 mg 166.7 mL/hr  over 90 Minutes Intravenous  Once 04/27/21 1532 04/28/21 0305         Micro    Objective   Vitals:   05/05/21 1945 05/05/21 2305 05/06/21 0520 05/06/21 0914  BP: (!) 158/57 (!) 148/39 (!) 127/51 (!) 166/73  Pulse:    97  Resp: 16 18 18 12    Temp: 98 F (36.7 C) 98.1 F (36.7 C) 97.7 F (36.5 C) 97.9 F (36.6 C)  TempSrc: Oral Oral Oral Oral  SpO2: 100% 97% 100% 99%  Weight:      Height:        Intake/Output Summary (Last 24 hours) at 05/06/2021 1506 Last data filed at 05/06/2021 0960 Gross per 24 hour  Intake 523.89 ml  Output 1575 ml  Net -1051.11 ml   Filed Weights   04/27/21 1500  Weight: 68.5 kg    Physical Exam:  General exam: laying in bed, awake, alert, no acute distress  Respiratory system: normal respiratory effort, on room air. Cardiovascular system: RRR, no pedal edema.   Central nervous system: A&O x4. no gross focal neurologic deficits, normal speech Extremities: moves all, left medial lower leg incision is healing well and without surrounding erythema or swelling  Labs   Data Reviewed: I have personally reviewed following labs and imaging studies  CBC: Recent Labs  Lab 05/02/21 0141 05/03/21 0207 05/03/21 1554 05/04/21 0443 05/04/21 1407 05/05/21 0247 05/05/21 2252  WBC 17.0* 27.9*  --  21.1*  --  21.9* 20.8*  HGB 8.4* 9.7* 7.8* 6.9* 9.3* 8.5* 8.5*  HCT 25.0* 29.7* 23.0* 20.8* 28.0* 26.2* 25.9*  MCV 86.8 90.3  --  89.7  --  88.5 88.7  PLT 328 434*  --  312  --  350 454   Basic Metabolic Panel: Recent Labs  Lab 05/01/21 0146 05/02/21 0141 05/03/21 0207 05/03/21 1554 05/04/21 0443 05/05/21 0247 05/05/21 2252  NA 136 133* 135 137 136 131* 133*  K 3.3* 3.6 3.3* 3.6 4.4 3.7 3.6  CL 106 105 103 107 109 104 106  CO2 19* 18* 19*  --  19* 18* 17*  GLUCOSE 156* 145* 155* 114* 222* 281* 202*  BUN 23 20 18 16  25* 37* 37*  CREATININE 1.43* 1.38* 1.28* 0.90 1.40* 1.60* 1.48*  CALCIUM 8.2* 8.1* 8.6*  --  7.7* 7.9* 8.0*  MG 2.0  --   --   --   --   --   --    GFR: Estimated Creatinine Clearance: 29.6 mL/min (A) (by C-G formula based on SCr of 1.48 mg/dL (H)). Liver Function Tests: No results for input(s): AST, ALT, ALKPHOS, BILITOT, PROT, ALBUMIN in the last 168 hours. No results  for input(s): LIPASE, AMYLASE in the last 168 hours. No results for input(s): AMMONIA in the last 168 hours. Coagulation Profile: No results for input(s): INR, PROTIME in the last 168 hours.  Cardiac Enzymes: No results for input(s): CKTOTAL, CKMB, CKMBINDEX, TROPONINI in the last 168 hours. BNP (last 3 results) No results for input(s): PROBNP in the last 8760 hours. HbA1C: No results for input(s): HGBA1C in the last 72 hours. CBG: Recent Labs  Lab 05/05/21 0907 05/05/21 1251 05/05/21 1700 05/05/21 2307 05/06/21 0653  GLUCAP 194* 195* 208* 198* 174*   Lipid Profile: No results for input(s): CHOL, HDL, LDLCALC, TRIG, CHOLHDL, LDLDIRECT in the last 72 hours. Thyroid Function Tests: No results for input(s): TSH, T4TOTAL, FREET4, T3FREE, THYROIDAB in the last 72 hours. Anemia Panel: No results for input(s): VITAMINB12, FOLATE, FERRITIN,  TIBC, IRON, RETICCTPCT in the last 72 hours.  Sepsis Labs: No results for input(s): PROCALCITON, LATICACIDVEN in the last 168 hours.   Recent Results (from the past 240 hour(s))  Blood culture (routine x 2)     Status: None   Collection Time: 04/27/21 12:14 PM   Specimen: BLOOD RIGHT HAND  Result Value Ref Range Status   Specimen Description BLOOD RIGHT HAND  Final   Special Requests   Final    BOTTLES DRAWN AEROBIC AND ANAEROBIC Blood Culture adequate volume   Culture   Final    NO GROWTH 5 DAYS Performed at [facility], [address], [place], [place] [address]    Report Status 05/02/2021 FINAL  Final  Blood culture (routine x 2)     Status: None   Collection Time: 04/27/21 12:28 PM   Specimen: BLOOD LEFT ARM  Result Value Ref Range Status   Specimen Description BLOOD LEFT ARM  Final   Special Requests   Final    BOTTLES DRAWN AEROBIC AND ANAEROBIC Blood Culture adequate volume   Culture   Final    NO GROWTH 5 DAYS Performed at [facility], [place] [address], [place], [place] [address]    Report Status 05/02/2021 FINAL   Final  Resp Panel by RT-PCR (Flu A&B, Covid) Nasopharyngeal Swab     Status: Abnormal   Collection Time: 04/27/21  4:40 PM   Specimen: Nasopharyngeal Swab; Nasopharyngeal(NP) swabs in vial transport medium  Result Value Ref Range Status   SARS Coronavirus 2 by RT PCR POSITIVE (A) NEGATIVE Final    Comment: RESULT CALLED TO, READ BACK BY AND VERIFIED WITHTish Men RN 1806 04/27/21 A BROWNING (NOTE) SARS-CoV-2 target nucleic acids are DETECTED.  The SARS-CoV-2 RNA is generally detectable in upper respiratory specimens during the acute phase of infection. Positive results are indicative of the presence of the identified virus, but do not rule out bacterial infection or co-infection with other pathogens not detected by the test. Clinical correlation with patient history and other diagnostic information is necessary to determine patient infection status. The expected result is Negative.  Fact Sheet for Patients: EntrepreneurPulse.com.au  Fact Sheet for Healthcare Providers: IncredibleEmployment.be  This test is not yet approved or cleared by the [place] FDA and  has been authorized for detection and/or diagnosis of SARS-CoV-2 by FDA under an Emergency Use Authorization (EUA).  This EUA will remain in effect (meaning this test can  be used) for the duration of  the COVID-19 declaration under Section 564(b)(1) of the Act, 21 U.S.C. section 360bbb-3(b)(1), unless the authorization is terminated or revoked sooner.     Influenza A by PCR NEGATIVE NEGATIVE Final   Influenza B by PCR NEGATIVE NEGATIVE Final    Comment: (NOTE) The Xpert Xpress SARS-CoV-2/FLU/RSV plus assay is intended as an aid in the diagnosis of influenza from Nasopharyngeal swab specimens and should not be used as a sole basis for treatment. Nasal washings and aspirates are unacceptable for Xpert Xpress SARS-CoV-2/FLU/RSV testing.  Fact Sheet for  Patients: EntrepreneurPulse.com.au  Fact Sheet for Healthcare Providers: IncredibleEmployment.be  This test is not yet approved or cleared by the [place] FDA and has been authorized for detection and/or diagnosis of SARS-CoV-2 by FDA under an Emergency Use Authorization (EUA). This EUA will remain in effect (meaning this test can be used) for the duration of the COVID-19 declaration under Section 564(b)(1) of the Act, 21 U.S.C. section 360bbb-3(b)(1), unless the authorization is terminated or  revoked.  Performed at [facility], [place] [address] [place], [place] [address]   Surgical PCR screen     Status: Abnormal   Collection Time: 04/27/21  9:31 PM   Specimen: Nasal Mucosa; Nasal Swab  Result Value Ref Range Status   MRSA, PCR NEGATIVE NEGATIVE Final   Staphylococcus aureus POSITIVE (A) NEGATIVE Final    Comment: (NOTE) The Xpert SA Assay (FDA approved for NASAL specimens in patients 51 years of age and older), is one component of a comprehensive surveillance program. It is not intended to diagnose infection nor to guide or monitor treatment. Performed at [facility], [place] [address] [place], [place] [address]   Aerobic/Anaerobic Culture w Gram Stain (surgical/deep wound)     Status: None (Preliminary result)   Collection Time: 05/03/21  5:42 PM   Specimen: PATH Other; Tissue  Result Value Ref Range Status   Specimen Description WOUND  Final   Special Requests LEFT THIRD TOE SWABS FOR CULTURE SPEC A  Final   Gram Stain   Final    RARE WBC PRESENT,BOTH PMN AND MONONUCLEAR RARE GRAM POSITIVE COCCI Performed at [facility], [place] [address], [place], [place] [address]    Culture   Final    FEW STAPHYLOCOCCUS AUREUS FEW STREPTOCOCCUS ANGINOSIS NO ANAEROBES ISOLATED; CULTURE IN PROGRESS FOR 5 DAYS    Report Status PENDING  Incomplete   Organism ID, Bacteria STAPHYLOCOCCUS AUREUS  Final   Organism ID, Bacteria  STREPTOCOCCUS ANGINOSIS  Final      Susceptibility   Staphylococcus aureus - MIC*    CIPROFLOXACIN <=0.5 SENSITIVE Sensitive     ERYTHROMYCIN >=8 RESISTANT Resistant     GENTAMICIN <=0.5 SENSITIVE Sensitive     OXACILLIN 0.5 SENSITIVE Sensitive     TETRACYCLINE <=1 SENSITIVE Sensitive     VANCOMYCIN 1 SENSITIVE Sensitive     TRIMETH/SULFA <=10 SENSITIVE Sensitive     CLINDAMYCIN RESISTANT Resistant     RIFAMPIN <=0.5 SENSITIVE Sensitive     Inducible Clindamycin POSITIVE Resistant     * FEW STAPHYLOCOCCUS AUREUS   Streptococcus anginosis - MIC*    PENICILLIN <=0.06 SENSITIVE Sensitive     CEFTRIAXONE 0.25 SENSITIVE Sensitive     ERYTHROMYCIN <=0.12 SENSITIVE Sensitive     LEVOFLOXACIN 0.5 SENSITIVE Sensitive     VANCOMYCIN 0.5 SENSITIVE Sensitive     * FEW STREPTOCOCCUS ANGINOSIS      Imaging Studies   No results found.   Medications   Scheduled Meds:  ascorbic acid  250 mg Oral Q supper   aspirin EC  81 mg Oral QHS   atorvastatin  80 mg Oral Daily   brimonidine  1 drop Right Eye BID   Chlorhexidine Gluconate Cloth  6 each Topical Daily   docusate sodium  100 mg Oral Daily   insulin aspart  0-15 Units Subcutaneous TID WC   metoprolol tartrate  37.5 mg Oral BID   pantoprazole  40 mg Oral Once per day on Mon Wed Fri   polyethylene glycol  17 g Oral Daily   pyridoxine  100 mg Oral QPM   saccharomyces boulardii  250 mg Oral BID   sodium chloride flush  3 mL Intravenous Q12H   Continuous Infusions:  sodium chloride     heparin 1,650 Units/hr (05/06/21 0628)   magnesium sulfate bolus IVPB     vancomycin 750 mg (05/06/21 1116)       LOS: 9 days    Time spent: 25  minutes with > 50% spent at bedside and in coordination of care     Ezekiel Slocumb, DO [facility]  05/06/2021, 3:06 PM      If 7PM-7AM, please contact night-coverage. How to contact the [facility] Attending or Consulting provider [place] or covering provider during after hours [place], for  this patient?    Check the care team in [facility] and look for a) attending/consulting TRH provider listed and b) the [facility] H team listed Log into [email or web address] and use [place]'s universal password to access. If you do not have the password, please contact the hospital operator. Locate the [facility] provider you are looking for under [facility] and page to a number that you can be directly reached. If you still have difficulty reaching the provider, please page the [facility] (Director on Call) for the Hospitalists listed on amion for assistance.

## 2021-05-06 NOTE — Progress Notes (Addendum)
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 Stanford Breed VSS. LLE well perfused. Amp site not yet granualting. Continue NS damp to dry dressing  changes. Continue high-intensity statin and aspirin  DVT prophy/on heparin infusion to promote graft patency. Platelet count normal. Plan to transition to rivaroxaban 2.5mg  BID when H and H stable. Tansfused additional unit PCs yesterday. Continue to monitor.  Leukocytosis. Remains afebrile. On vanc. (Cultures from left toe swabs grew staph and strep...sensitives pending.)  Dispo: SNF  SUBJECTIVE:   Pain when ambulating  PHYSICAL EXAM:   Vitals:   05/05/21 1945 05/05/21 2305 05/06/21 0520 05/06/21 0914  BP: (!) 158/57 (!) 148/39 (!) 127/51 (!) 166/73  Pulse:    97  Resp: 16 18 18 12   Temp: 98 F (36.7 C) 98.1 F (36.7 C) 97.7 F (36.5 C) 97.9 F (36.6 C)  TempSrc: Oral Oral Oral Oral  SpO2: 100% 97% 100% 99%  Weight:      Height:       General appearance: Awake, alert in no apparent distress Cardiac: Heart rate and rhythm are regular Respirations: Nonlabored Incisions: Right groin and lower leg incisions are all well approximated without bleeding or hematoma Extremities: Both feet are warm with intact sensation and motor function.  Left 3rd toe amp site not yet granulating. Pulse/Doppler exam: Brisk left dorsalis pedis artery Doppler signal       LABS:   Lab Results  Component Value Date   WBC 20.8 (H) 05/05/2021   HGB 8.5 (L) 05/05/2021   HCT 25.9 (L) 05/05/2021   MCV 88.7 05/05/2021   PLT 363 05/05/2021   Lab Results  Component Value Date   CREATININE 1.48 (H) 05/05/2021   Lab Results  Component Value Date   INR 1.1 04/27/2021   CBG (last 3)  Recent Labs    05/05/21 1700 05/05/21 2307  05/06/21 0653  GLUCAP 208* 198* 174*    PROBLEM LIST:    Principal Problem:   Osteomyelitis of foot, left, acute (HCC) Active Problems:   Essential hypertension   PAD (peripheral artery disease) (HCC)   Cellulitis in diabetic foot (HCC)   Acute kidney injury superimposed on chronic kidney disease (HCC)   Hyponatremia   Anemia, chronic disease   Cellulitis of left lower extremity   CURRENT MEDS:    ascorbic acid  250 mg Oral Q supper   aspirin EC  81 mg Oral QHS   atorvastatin  80 mg Oral Daily   brimonidine  1 drop Right Eye BID   Chlorhexidine Gluconate Cloth  6 each Topical Daily   docusate sodium  100 mg Oral Daily   insulin aspart  0-15 Units Subcutaneous TID WC   metoprolol tartrate  37.5 mg Oral BID   pantoprazole  40 mg Oral Once per day on Mon Wed Fri   polyethylene glycol  17 g Oral Daily   pyridoxine  100 mg Oral QPM   saccharomyces boulardii  250 mg Oral BID   sodium chloride flush  3 mL Intravenous Q12H   Barbie Banner, PA-C  Office: [phone] 05/06/2021   I agree with the above.  I have seen and evaluated the patient.  Her dressing was changed.  We will continue to monitor the wound.  At some point, she will  need intervention in her right iliac artery.  Annamarie Major

## 2021-05-06 NOTE — TOC Progression Note (Signed)
Transition of Care [facility]) - Progression Note    Patient Details  Name: TATYANNA CRONK MRN: [id number] Date of Birth: 05-Mar-1941  [facility] CM/SW [place], [place] Phone Number: [phone] 05/06/2021, 2:23 PM  Clinical Narrative:     CSW spoke with pt and provided her with her 5 bed offers. Pt choose [place] stating that was her first choice. CSW attempted to follow up with [facility] to confirm the bed offer however had to leave message.  TOC team will continue to assist with discharge planning needs.   Expected Discharge Plan: [place] Barriers to Discharge: Continued Medical Work up  Expected Discharge Plan and Services Expected Discharge Plan: [place] In-house Referral: Clinical Social Work Discharge Planning Services: CM Consult Post Acute Care Choice: [place] Living arrangements for the past 2 months: Single Family Home                 DME Arranged: N/A                     Social Determinants of Health (SDOH) Interventions    Readmission Risk Interventions Readmission Risk Prevention Plan 07/22/2019  Post Dischage Appt Complete  Medication Screening Complete  Transportation Screening Complete  Some recent data might be hidden

## 2021-05-06 NOTE — Progress Notes (Signed)
Occupational Therapy Treatment Patient Details Name: [name] MRN: [id number] DOB: 03/01/1941 Today's Date: 05/06/2021    History of present illness Pt adm 8/4 with lt third toe gangrene. Pt underwent abdominal aortogram with bilateral lower extremity runoff on 8/5. Pt underwent lt 3rd toe amputation  and lt common femoral - tibioperoneal trunk bypass on 8/10. Pt with incidental covid + finding. PMH - multiple LE vascular surgeries, rt transmetatarsal amputation, HTN, CABG, DM, PVD, diabetic neuropathy   OT comments  Pt. Seen for skilled OT treatment. Completes bed mobility S.  Min a for donning L darco shoe but manages the R without assistance.  Min guard a for transfer to/from bsc.  Moving well and denies pain.  Cont. To progress mobility and adls next session.   Follow Up Recommendations  Home health OT;Supervision - Intermittent    Equipment Recommendations  None recommended by OT    Recommendations for Other Services      Precautions / Restrictions Precautions Precautions: Fall Other Brace: Darco shoe on L foot. Pt also wears Darco on R foot (for almost 2 years) Restrictions Other Position/Activity Restrictions: weight bearing on heel       Mobility Bed Mobility Overal bed mobility: Needs Assistance Bed Mobility: Supine to Sit;Sit to Supine     Supine to sit: Supervision Sit to supine: Supervision   General bed mobility comments: Supervision for lines    Transfers Overall transfer level: Needs assistance Equipment used: None Transfers: Squat Pivot Transfers     Squat pivot transfers: Min guard     General transfer comment: able to perform squat pivot to L to bsc and back to bed with approx. 4 scoots to hob. maintains heel wt. bearing well without cues    Balance                                           ADL either performed or assessed with clinical judgement   ADL Overall ADL's : Needs assistance/impaired                      Lower Body Dressing: Minimal assistance;Sitting/lateral leans Lower Body Dressing Details (indicate cue type and reason): able to don R darco shoe but required assistance with the L Toilet Transfer: Min guard;Squat-pivot;BSC   Toileting- Clothing Manipulation and Hygiene: Minimal assistance;Sit to/from stand Toileting - Clothing Manipulation Details (indicate cue type and reason): simulated did not actually use the bsc just practiced the transfer sequence       General ADL Comments: pt. moving well.  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  Has traveled to 65 of the states with her family.  Has 3 children.  Very  pleasant and great story teller.      Pertinent Vitals/ Pain       Pain Assessment: No/denies pain  Home Living                                          Prior Functioning/Environment              Frequency  Min 2X/week        Progress Toward Goals  OT Goals(current goals can now be found in the care plan section)  Progress towards OT goals: Progressing toward goals     Plan Discharge plan remains appropriate    Co-evaluation                 AM-PAC OT "6 Clicks" Daily Activity     Outcome Measure   Help from another person eating meals?: None Help from another person taking care of personal grooming?: A Little Help from another person toileting, which includes using toliet, bedpan, or urinal?: A Little Help from another person bathing (including washing, rinsing, drying)?: A  Little Help from another person to put on and taking off regular upper body clothing?: A Little Help from another person to put on and taking off regular lower body clothing?: A Little 6 Click Score: 19    End of Session Equipment Utilized During Treatment: Gait belt  OT Visit Diagnosis: Unsteadiness on feet (R26.81);Other abnormalities of gait and mobility (R26.89)   Activity Tolerance Patient tolerated treatment well   Patient Left in bed;with call bell/phone within reach;with bed alarm set   Nurse Communication          Time: 3149-7026 OT Time Calculation (min): 30 min  Charges: OT General Charges $OT Visit: 1 Visit OT Treatments $Self Care/Home Management : 23-37 mins  Sonia Baller, COTA/L Acute Rehabilitation [phone]    Tanya Nones 05/06/2021, 2:10 PM

## 2021-05-06 NOTE — Progress Notes (Signed)
[place] for heparin Indication: L common femoral-tibioperoneal trunk bypass   Allergies  Allergen Reactions   Bactrim [Sulfamethoxazole-Trimethoprim] Other (See Comments)    ^ K+( elevated)    Demerol [Meperidine]     Delusional    Scopolamine     Delusional    Tramadol Other (See Comments)    Keeps awake   Versed [Midazolam] Anxiety    Frantic, out of my mind, agitated     Patient Measurements: Height: 5' 4.5" (163.8 cm) Weight: 68.5 kg (151 lb 0.2 oz) IBW/kg (Calculated) : 55.85 Heparin Dosing Weight: 68kg  Vital Signs: Temp: 97.7 F (36.5 C) (08/13 0520) Temp Source: Oral (08/13 0520) BP: 127/51 (08/13 0520)  Labs: Recent Labs    05/04/21 0443 05/04/21 1407 05/04/21 1559 05/05/21 0247 05/05/21 1148 05/05/21 2252  HGB 6.9* 9.3*  --  8.5*  --  8.5*  HCT 20.8* 28.0*  --  26.2*  --  25.9*  PLT 312  --   --  350  --  363  HEPARINUNFRC  --   --    < > 0.13* 0.15* 0.29*  CREATININE 1.40*  --   --  1.60*  --  1.48*   < > = values in this interval not displayed.     Estimated Creatinine Clearance: 29.6 mL/min (A) (by C-G formula based on SCr of 1.48 mg/dL (H)).    Assessment: 80 y.o. female admitted with osteo and grangrene of toe now s/p left common femoral bypass and toe amputation 8/10. No anticoagulation at home. Pharmacy consulted for heparin dosing to promote graft patency. Per VVS, planning eventual transition to Xarelto 2.5 mg BID when H/H stable.  Heparin level of 0.6 is therapeutic on 1650 units/hr. Hb 8.5 is low but stable, plt normal. No s/sx of bleeding noted.  Goal of Therapy:  Heparin level 0.3-0.7 units/ml Monitor platelets by anticoagulation protocol: Yes   Plan:  Continue heparin 1650 units/hr Daily heparin level and CBC Monitor for s/sx of bleeding F/u on transition to [place], PharmD PGY1 Pharmacy Resident 05/06/2021  2:09 PM  Please check [email or web address] for unit-specific pharmacy phone  numbers.

## 2021-05-07 DIAGNOSIS — M86172 Other acute osteomyelitis, left ankle and foot: Secondary | ICD-10-CM | POA: Diagnosis not present

## 2021-05-07 LAB — CBC
HCT: 24.1 % — ABNORMAL LOW (ref 36.0–46.0)
Hemoglobin: 7.7 g/dL — ABNORMAL LOW (ref 12.0–15.0)
MCH: 28.7 pg (ref 26.0–34.0)
MCHC: 32 g/dL (ref 30.0–36.0)
MCV: 89.9 fL (ref 80.0–100.0)
Platelets: 406 10*3/uL — ABNORMAL HIGH (ref 150–400)
RBC: 2.68 MIL/uL — ABNORMAL LOW (ref 3.87–5.11)
RDW: 14.7 % (ref 11.5–15.5)
WBC: 18.7 10*3/uL — ABNORMAL HIGH (ref 4.0–10.5)
nRBC: 0.5 % — ABNORMAL HIGH (ref 0.0–0.2)

## 2021-05-07 LAB — BASIC METABOLIC PANEL
Anion gap: 7 (ref 5–15)
BUN: 29 mg/dL — ABNORMAL HIGH (ref 8–23)
CO2: 21 mmol/L — ABNORMAL LOW (ref 22–32)
Calcium: 8.3 mg/dL — ABNORMAL LOW (ref 8.9–10.3)
Chloride: 111 mmol/L (ref 98–111)
Creatinine, Ser: 1.15 mg/dL — ABNORMAL HIGH (ref 0.44–1.00)
GFR, Estimated: 48 mL/min — ABNORMAL LOW (ref >60)
Glucose, Bld: 222 mg/dL — ABNORMAL HIGH (ref 70–99)
Potassium: 4.1 mmol/L (ref 3.5–5.1)
Sodium: 139 mmol/L (ref 135–145)

## 2021-05-07 LAB — GLUCOSE, CAPILLARY
Glucose-Capillary: 209 mg/dL — ABNORMAL HIGH (ref 70–99)
Glucose-Capillary: 216 mg/dL — ABNORMAL HIGH (ref 70–99)
Glucose-Capillary: 180 mg/dL — ABNORMAL HIGH (ref 70–99)
Glucose-Capillary: 212 mg/dL — ABNORMAL HIGH (ref 70–99)

## 2021-05-07 LAB — HEPARIN LEVEL (UNFRACTIONATED): Heparin Unfractionated: 0.26 IU/mL — ABNORMAL LOW (ref 0.30–0.70)

## 2021-05-07 MED ORDER — SODIUM CHLORIDE 0.9 % IV SOLN
2.0000 g | INTRAVENOUS | Status: DC
  Administered 2021-05-07 – 2021-05-09 (×3): 2 g via INTRAVENOUS
  Filled 2021-05-07 (×4): qty 20

## 2021-05-07 NOTE — Progress Notes (Signed)
[place] for heparin  Indication: graft patency s/p L common femoral-tibioperoneal trunk bypass   Allergies  Allergen Reactions   Bactrim [Sulfamethoxazole-Trimethoprim] Other (See Comments)    ^ K+( elevated)    Demerol [Meperidine]     Delusional    Scopolamine     Delusional    Tramadol Other (See Comments)    Keeps awake   Versed [Midazolam] Anxiety    Frantic, out of my mind, agitated     Patient Measurements: Height: 5' 4.5" (163.8 cm) Weight: 68.5 kg (151 lb 0.2 oz) IBW/kg (Calculated) : 55.85 Heparin Dosing Weight: 68kg  Vital Signs: Temp: 98.2 F (36.8 C) (08/14 0423) Temp Source: Oral (08/14 0423) BP: 145/56 (08/14 0423) Pulse Rate: 81 (08/14 0423)  Labs: Recent Labs    05/05/21 0247 05/05/21 1148 05/05/21 2252 05/06/21 1008 05/07/21 0039  HGB 8.5*  --  8.5*  --  7.7*  HCT 26.2*  --  25.9*  --  24.1*  PLT 350  --  363  --  406*  HEPARINUNFRC 0.13*   < > 0.29* 0.60 0.26*  CREATININE 1.60*  --  1.48*  --  1.15*   < > = values in this interval not displayed.     Estimated Creatinine Clearance: 38.1 mL/min (A) (by C-G formula based on SCr of 1.15 mg/dL (H)).    Assessment: 80 y.o. female admitted with osteo and grangrene of toe now s/p left common femoral bypass and toe amputation 8/10. No anticoagulation at home. Pharmacy consulted for heparin dosing to promote graft patency. Per VVS, planning eventual transition to Xarelto 2.5 mg BID when H/H stable.   Heparin level of 0.26 is subtherapeutic on 1650 units/hr. Hb trending down 8.5>7.7 but been low entire admission, plt normal. No s/sx of bleeding noted.    Goal of Therapy:  Heparin level 0.3-0.7 units/ml Monitor platelets by anticoagulation protocol: Yes   Plan:  Increase heparin to 1750 units/hr Daily heparin level and CBC with morning labs Monitor for s/sx of bleeding F/u on transition to [place], PharmD PGY1 Pharmacy Resident 05/07/2021  7:38  AM  Please check [email or web address] for unit-specific pharmacy phone numbers.

## 2021-05-07 NOTE — Progress Notes (Signed)
PROGRESS NOTE    [name]   BZJ:[id number]  DOB: 09/19/1941  PCP: [name], MD    DOA: 04/27/2021 LOS: 10   Assessment & Plan   Principal Problem:   Osteomyelitis of foot, left, acute ([place]) Active Problems:   Essential hypertension   PAD (peripheral artery disease) ([place])   Cellulitis in diabetic foot ([place])   Acute kidney injury superimposed on chronic kidney disease ([place])   Hyponatremia   Anemia, chronic disease   Cellulitis of left lower extremity   Left great toe gangrene Sepsis due to osteomyelitis, cellulitis -sepsis POA.  Sepsis physiology has improved. Status post angiogram on 8/5 showing severe left superficial femoral artery occlusive disease with 3 cm occlusion of the left popliteal artery, 80% stenosis of the distal right external iliac artery above existing femoropopliteal bypass --Continue IV vancomycin  -- On heparin drip for graft patency --Stop Zosyn given cultures with GPC's (8/11) --Blood cultures negative --Vascular surgery and podiatry are consulted Vascular Recs: OOB/mobilize Wet-to-dry dressing changes BID  May transition to [facility] once infection cleared. Cultures pending. Continue antibiotics x 5 days. PT / OT / OOB / Ambulate with heel weightbearing --Hold Plavix until okay from surgical standpoint  Acute kidney injury -due to prerenal azotemia, had improved but creatinine now rising some again.  Suspect due to poor p.o. intake --Monitor BMP --Encourage p.o. intake and will run some fluids if needed tomorrow if still worsening  Hyponatremia -mild, sodium 131 on morning labs.  Suspect poor p.o. intake.  We will hold off on IV fluids for now and encourage p.o. intake.  IV fluids tomorrow if not improving.  COVID-19 infection -incidentally positive and asymptomatic. --Monitor clinically --Airborne isolation x10 days  Essential hypertension -continue metoprolol.  HCTZ and myocarditis are on hold.  Type 2 diabetes -uncontrolled  with A1c 7.7 in April. --Hold oral meds --Sliding scale NovoLog for now  Chronic iron deficiency anemia -iron panel consistent with severe iron deficiency. -- Consider IV iron infusion postoperatively  CAD status post CABG in 03/2020 -continue aspirin and statin.  Plavix on hold.  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  Continue aspirin and statin.  Plavix on hold.   Patient BMI: Body mass index is 25.52 kg/m.   DVT prophylaxis: SCD's Start: 05/03/21 2005  Lovenox held for surgery   Diet:  Diet Orders (From admission, onward)     Start     Ordered   05/05/21 2219  Diet Carb Modified Fluid consistency: Thin; Room service appropriate? Yes  Diet effective now       Question Answer Comment  Diet-HS Snack? Nothing   Calorie Level Medium 1600-2000   Fluid consistency: Thin   Room service appropriate? Yes      05/05/21 2218              Code Status: Full Code   Brief Narrative / Hospital Course to Date:   "[name] is a 80 y.o. female with medical history significant of hypertension, hyperlipidemia, CAD s/p CABG in 2021, PVD s/p bypass grafting, diabetes mellitus type II, ray amputation of right foot presented with left third toe wound.  developed redness pain and streaking going up her left leg associated with fevers and chills . She is s/p right common femoral endarterectomy with femoral popliteal bypass graft on 08/16/2020.  And previously had angioplasty with stent continue to have the left SFA. In the ED she was febrile, mildly tachycardic, WBC  was 20.8K, x-ray of the left foot noted osteomyelitis of distal phalanx third toe. -Was also incidentally positive for COVID, is asymptomatic -Vascular following, underwent angiogram on 8/5, noted to have severe left superficial femoral artery occlusive disease, plan for left fem-pop bypass tomorrow. Improving on antibiotics, still with intermittent fevers"  Subjective 05/07/21     Patient up in recliner when seen today.  She reports not needing any pain medications.  Only has significant pain with moving around but it resolves on its own.  She mentions that her daughter works and she may need to go to a SNF instead of home initially.   Disposition Plan & Communication   Status is: Inpatient  Remains inpatient appropriate because: Further procedures are planned as outlined above  Dispo: The patient is from: Home              Anticipated d/c is to: Home              Patient currently is not medically stable to d/c.   Difficult to place patient No    Consults, Procedures, Significant Events   Consultants:  Vascular surgery Podiatry  Procedures:  8/5 - Ultrasound right groin 8/5 - Abdominal aortogram with bilateral lower extremity runoff 8/10 - left common femoral - tibioperoneal trunk bypass with 94mm ringed ePTFE in anatomic tunnel 8/10 - left third toe open amputation  Antimicrobials:  Anti-infectives (From admission, onward)    Start     Dose/Rate Route Frequency Ordered Stop   05/07/21 0845  cefTRIAXone (ROCEPHIN) 2 g in sodium chloride 0.9 % 100 mL IVPB        2 g 200 mL/hr over 30 Minutes Intravenous Every 24 hours 05/07/21 0755     05/06/21 0930  vancomycin (VANCOREADY) IVPB 750 mg/150 mL  Status:  Discontinued        750 mg 150 mL/hr over 60 Minutes Intravenous Every 24 hours 05/06/21 0841 05/07/21 0758   05/06/21 0900  vancomycin (VANCOREADY) IVPB 1250 mg/250 mL  Status:  Discontinued        1,250 mg 166.7 mL/hr over 90 Minutes Intravenous Every 48 hours 05/05/21 1506 05/06/21 0841   04/28/21 1800  vancomycin (VANCOREADY) IVPB 750 mg/150 mL  Status:  Discontinued        750 mg 150 mL/hr over 60 Minutes Intravenous Every 24 hours 04/28/21 1324 05/05/21 1506   04/28/21 0030  piperacillin-tazobactam (ZOSYN) IVPB 3.375 g  Status:  Discontinued        3.375 g 12.5 mL/hr over 240 Minutes Intravenous Every 8 hours 04/27/21 1650 05/04/21 1358    04/27/21 1603  vancomycin variable dose per unstable renal function (pharmacist dosing)  Status:  Discontinued         Does not apply See admin instructions 04/27/21 1604 04/28/21 1324   04/27/21 1545  piperacillin-tazobactam (ZOSYN) IVPB 3.375 g        3.375 g 100 mL/hr over 30 Minutes Intravenous  Once 04/27/21 1532 04/27/21 1707   04/27/21 1545  vancomycin (VANCOREADY) IVPB 1250 mg/250 mL        1,250 mg 166.7 mL/hr over 90 Minutes Intravenous  Once 04/27/21 1532 04/28/21 0305         Micro    Objective   Vitals:   05/06/21 2353 05/07/21 0423 05/07/21 0755 05/07/21 1157  BP: (!) 128/45 (!) 145/56 (!) 151/65 (!) 149/62  Pulse: 80 81 85 80  Resp: 16 19 19 16   Temp: 98.6 F (37 C) 98.2 F (36.8  C) 98.2 F (36.8 C) 98.8 F (37.1 C)  TempSrc: Oral Oral Oral Oral  SpO2: 98% 100% 99% 100%  Weight:      Height:        Intake/Output Summary (Last 24 hours) at 05/07/2021 1859 Last data filed at 05/07/2021 1800 Gross per 24 hour  Intake 1462.78 ml  Output 2500 ml  Net -1037.22 ml   Filed Weights   04/27/21 1500  Weight: 68.5 kg    Physical Exam:  General exam: in recliner, awake, alert, no mild distress due to pain Respiratory system: CTAB, normal respiratory effort, on room air. Cardiovascular system: RRR, no pedal edema.   Central nervous system: A&O x4. no gross focal neurologic deficits, normal speech Skin: dry, intact, normal temp and color Psychiatry: normal mood, congruent affect, judgement and insight appear normal  Labs   Data Reviewed: I have personally reviewed following labs and imaging studies  CBC: Recent Labs  Lab 05/03/21 0207 05/03/21 1554 05/04/21 0443 05/04/21 1407 05/05/21 0247 05/05/21 2252 05/07/21 0039  WBC 27.9*  --  21.1*  --  21.9* 20.8* 18.7*  HGB 9.7*   < > 6.9* 9.3* 8.5* 8.5* 7.7*  HCT 29.7*   < > 20.8* 28.0* 26.2* 25.9* 24.1*  MCV 90.3  --  89.7  --  88.5 88.7 89.9  PLT 434*  --  312  --  350 363 406*   < > = values in this  interval not displayed.   Basic Metabolic Panel: Recent Labs  Lab 05/01/21 0146 05/02/21 0141 05/03/21 0207 05/03/21 1554 05/04/21 0443 05/05/21 0247 05/05/21 2252 05/07/21 0039  NA 136   < > 135 137 136 131* 133* 139  K 3.3*   < > 3.3* 3.6 4.4 3.7 3.6 4.1  CL 106   < > 103 107 109 104 106 111  CO2 19*   < > 19*  --  19* 18* 17* 21*  GLUCOSE 156*   < > 155* 114* 222* 281* 202* 222*  BUN 23   < > 18 16 25* 37* 37* 29*  CREATININE 1.43*   < > 1.28* 0.90 1.40* 1.60* 1.48* 1.15*  CALCIUM 8.2*   < > 8.6*  --  7.7* 7.9* 8.0* 8.3*  MG 2.0  --   --   --   --   --   --   --    < > = values in this interval not displayed.   GFR: Estimated Creatinine Clearance: 38.1 mL/min (A) (by C-G formula based on SCr of 1.15 mg/dL (H)). Liver Function Tests: No results for input(s): AST, ALT, ALKPHOS, BILITOT, PROT, ALBUMIN in the last 168 hours. No results for input(s): LIPASE, AMYLASE in the last 168 hours. No results for input(s): AMMONIA in the last 168 hours. Coagulation Profile: No results for input(s): INR, PROTIME in the last 168 hours.  Cardiac Enzymes: No results for input(s): CKTOTAL, CKMB, CKMBINDEX, TROPONINI in the last 168 hours. BNP (last 3 results) No results for input(s): PROBNP in the last 8760 hours. HbA1C: No results for input(s): HGBA1C in the last 72 hours. CBG: Recent Labs  Lab 05/06/21 1818 05/06/21 2154 05/07/21 0546 05/07/21 0757 05/07/21 1200  GLUCAP 170* 187* 209* 216* 180*   Lipid Profile: No results for input(s): CHOL, HDL, LDLCALC, TRIG, CHOLHDL, LDLDIRECT in the last 72 hours. Thyroid Function Tests: No results for input(s): TSH, T4TOTAL, FREET4, T3FREE, THYROIDAB in the last 72 hours. Anemia Panel: No results for input(s): VITAMINB12, FOLATE, FERRITIN, TIBC,  IRON, RETICCTPCT in the last 72 hours.  Sepsis Labs: No results for input(s): PROCALCITON, LATICACIDVEN in the last 168 hours.   Recent Results (from the past 240 hour(s))  Surgical PCR  screen     Status: Abnormal   Collection Time: 04/27/21  9:31 PM   Specimen: Nasal Mucosa; Nasal Swab  Result Value Ref Range Status   MRSA, PCR NEGATIVE NEGATIVE Final   Staphylococcus aureus POSITIVE (A) NEGATIVE Final    Comment: (NOTE) The Xpert SA Assay (FDA approved for NASAL specimens in patients 22 years of age and older), is one component of a comprehensive surveillance program. It is not intended to diagnose infection nor to guide or monitor treatment. Performed at [facility], [place] [address] [place], [place] [address]   Aerobic/Anaerobic Culture w Gram Stain (surgical/deep wound)     Status: None (Preliminary result)   Collection Time: 05/03/21  5:42 PM   Specimen: PATH Other; Tissue  Result Value Ref Range Status   Specimen Description WOUND  Final   Special Requests LEFT THIRD TOE SWABS FOR CULTURE SPEC A  Final   Gram Stain   Final    RARE WBC PRESENT,BOTH PMN AND MONONUCLEAR RARE GRAM POSITIVE COCCI Performed at [facility], [place] [address], [place], [place] [address]    Culture   Final    FEW STAPHYLOCOCCUS AUREUS FEW STREPTOCOCCUS ANGINOSIS NO ANAEROBES ISOLATED; CULTURE IN PROGRESS FOR 5 DAYS    Report Status PENDING  Incomplete   Organism ID, Bacteria STAPHYLOCOCCUS AUREUS  Final   Organism ID, Bacteria STREPTOCOCCUS ANGINOSIS  Final      Susceptibility   Staphylococcus aureus - MIC*    CIPROFLOXACIN <=0.5 SENSITIVE Sensitive     ERYTHROMYCIN >=8 RESISTANT Resistant     GENTAMICIN <=0.5 SENSITIVE Sensitive     OXACILLIN 0.5 SENSITIVE Sensitive     TETRACYCLINE <=1 SENSITIVE Sensitive     VANCOMYCIN 1 SENSITIVE Sensitive     TRIMETH/SULFA <=10 SENSITIVE Sensitive     CLINDAMYCIN RESISTANT Resistant     RIFAMPIN <=0.5 SENSITIVE Sensitive     Inducible Clindamycin POSITIVE Resistant     * FEW STAPHYLOCOCCUS AUREUS   Streptococcus anginosis - MIC*    PENICILLIN <=0.06 SENSITIVE Sensitive     CEFTRIAXONE 0.25 SENSITIVE Sensitive      ERYTHROMYCIN <=0.12 SENSITIVE Sensitive     LEVOFLOXACIN 0.5 SENSITIVE Sensitive     VANCOMYCIN 0.5 SENSITIVE Sensitive     * FEW STREPTOCOCCUS ANGINOSIS      Imaging Studies   No results found.   Medications   Scheduled Meds:  ascorbic acid  250 mg Oral Q supper   aspirin EC  81 mg Oral QHS   atorvastatin  80 mg Oral Daily   brimonidine  1 drop Right Eye BID   Chlorhexidine Gluconate Cloth  6 each Topical Daily   docusate sodium  100 mg Oral Daily   insulin aspart  0-15 Units Subcutaneous TID WC   metoprolol tartrate  37.5 mg Oral BID   pantoprazole  40 mg Oral Once per day on Mon Wed Fri   polyethylene glycol  17 g Oral Daily   pyridoxine  100 mg Oral QPM   saccharomyces boulardii  250 mg Oral BID   sodium chloride flush  3 mL Intravenous Q12H   Continuous Infusions:  sodium chloride     cefTRIAXone (ROCEPHIN)  IV 2 g (05/07/21 0843)   heparin 1,750 Units/hr (05/07/21 1500)   magnesium sulfate bolus  IVPB         LOS: 10 days    Time spent: 25 minutes with > 50% spent at bedside and in coordination of care     Ezekiel Slocumb, DO [facility]  05/07/2021, 6:59 PM      If 7PM-7AM, please contact night-coverage. How to contact the [facility] Attending or Consulting provider [place] or covering provider during after hours [place], for this patient?    Check the care team in [facility] and look for a) attending/consulting [facility] provider listed and b) the [name] team listed Log into [email or web address] and use [place]'s universal password to access. If you do not have the password, please contact the hospital operator. Locate the [facility] provider you are looking for under [facility] and page to a number that you can be directly reached. If you still have difficulty reaching the provider, please page the [facility] (Director on Call) for the Hospitalists listed on amion for assistance.  PROGRESS NOTE    [name]   ZOX:[id number]  DOB: 11-27-1940  PCP: [name], MD    DOA: 04/27/2021 LOS: 10   Assessment & Plan   Principal Problem:   Osteomyelitis of foot, left, acute ([place]) Active Problems:   Essential hypertension   PAD (peripheral artery disease) ([place])   Cellulitis in diabetic foot ([place])   Acute kidney injury superimposed on chronic kidney disease ([place])   Hyponatremia   Anemia, chronic disease   Cellulitis of left lower extremity   Left great toe gangrene Sepsis due to osteomyelitis, cellulitis -sepsis POA.  Sepsis physiology has improved. Status post angiogram on 8/5 showing severe left superficial femoral artery occlusive disease with 3 cm occlusion of the left popliteal artery, 80% stenosis of the distal right external iliac artery above existing femoropopliteal bypass --Continue IV vancomycin  -- On heparin drip for graft patency --Stop Zosyn given cultures with GPC's (8/11) --Blood cultures negative --Vascular surgery and podiatry are consulted Vascular Recs: OOB/mobilize Wet-to-dry dressing changes BID  May transition to [facility] once infection cleared. Cultures pending. Continue antibiotics x 5 days. PT / OT / OOB / Ambulate with heel weightbearing --Hold Plavix until okay from surgical standpoint  Acute kidney injury - due to prerenal azotemia, had improved but creatinine now rising some again.  Suspect due to poor p.o. intake.  Also having urinary retention.  Cr 8/13 improved somewhat after Foley placed 8/12. --Monitor BMP --Foley for now --Encourage p.o. intake and will run some fluids if needed tomorrow if still worsening  Acute urinary retention -Foley placed on 05/05/2021 after patient requiring and out cath and still having retention on bladder scan. -- Voiding trial today -- Trial of Flomax or other medication to help if still retaining  Hyponatremia -mild, sodium 131 on morning labs.  Suspect poor p.o. intake.  We will hold off on IV fluids for now and encourage p.o. intake.  IV fluids tomorrow if not  improving.  COVID-19 infection -incidentally positive and asymptomatic. --Monitor clinically --Airborne isolation x10 days  Essential hypertension -continue metoprolol.  HCTZ and myocarditis are on hold.  Type 2 diabetes -uncontrolled with A1c 7.7 in April. --Hold oral meds --Sliding scale NovoLog for now  Chronic iron deficiency anemia -iron panel consistent with severe iron deficiency. -- Consider IV iron infusion postoperatively  CAD status post CABG in 03/2020 -continue aspirin and statin.  Plavix on hold.  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  Continue  aspirin and statin.  Plavix on hold.   Patient BMI: Body mass index is 25.52 kg/m.   DVT prophylaxis: SCD's Start: 05/03/21 2005  Lovenox held for surgery   Diet:  Diet Orders (From admission, onward)     Start     Ordered   05/05/21 2219  Diet Carb Modified Fluid consistency: Thin; Room service appropriate? Yes  Diet effective now       Question Answer Comment  Diet-HS Snack? Nothing   Calorie Level Medium 1600-2000   Fluid consistency: Thin   Room service appropriate? Yes      05/05/21 2218              Code Status: Full Code   Brief Narrative / Hospital Course to Date:   "KETARA CAVNESS is a 80 y.o. female with medical history significant of hypertension, hyperlipidemia, CAD s/p CABG in 2021, PVD s/p bypass grafting, diabetes mellitus type II, ray amputation of right foot presented with left third toe wound.  developed redness pain and streaking going up her left leg associated with fevers and chills . She is s/p right common femoral endarterectomy with femoral popliteal bypass graft on 08/16/2020.  And previously had angioplasty with stent continue to have the left SFA. In the ED she was febrile, mildly tachycardic, WBC was 20.8K, x-ray of the left foot noted osteomyelitis of distal phalanx third toe. -Was also incidentally positive for COVID, is  asymptomatic -Vascular following, underwent angiogram on 8/5, noted to have severe left superficial femoral artery occlusive disease, plan for left fem-pop bypass tomorrow. Improving on antibiotics, still with intermittent fevers"  Subjective 05/07/21    Patient awake laying in bed when seen this morning.  Reports she's feeling well.  No significant pain.  We discussed her BP elevated but lower DBP's.  Her ophthalmologist said her diastolic BP should not go too low or can worsen her vision due to underlying condition.  Declines to add additional BP medication at this time.  She would like to go to CIR for rehab where she will have closer medical care to ensure her wound is healing well.  I relayed this to TOC.   Disposition Plan & Communication   Status is: Inpatient  Remains inpatient appropriate because: Further procedures are planned as outlined above  Dispo: The patient is from: Home              Anticipated d/c is to: Home              Patient currently is not medically stable to d/c.   Difficult to place patient No    Consults, Procedures, Significant Events   Consultants:  Vascular surgery Podiatry  Procedures:  8/5 - Ultrasound right groin 8/5 - Abdominal aortogram with bilateral lower extremity runoff 8/10 - left common femoral - tibioperoneal trunk bypass with 46mm ringed ePTFE in anatomic tunnel 8/10 - left third toe open amputation  Antimicrobials:  Anti-infectives (From admission, onward)    Start     Dose/Rate Route Frequency Ordered Stop   05/07/21 0845  cefTRIAXone (ROCEPHIN) 2 g in sodium chloride 0.9 % 100 mL IVPB        2 g 200 mL/hr over 30 Minutes Intravenous Every 24 hours 05/07/21 0755     05/06/21 0930  vancomycin (VANCOREADY) IVPB 750 mg/150 mL  Status:  Discontinued        750 mg 150 mL/hr over 60 Minutes Intravenous Every 24 hours 05/06/21 0841 05/07/21 0758   05/06/21  0900  vancomycin (VANCOREADY) IVPB 1250 mg/250 mL  Status:  Discontinued         1,250 mg 166.7 mL/hr over 90 Minutes Intravenous Every 48 hours 05/05/21 1506 05/06/21 0841   04/28/21 1800  vancomycin (VANCOREADY) IVPB 750 mg/150 mL  Status:  Discontinued        750 mg 150 mL/hr over 60 Minutes Intravenous Every 24 hours 04/28/21 1324 05/05/21 1506   04/28/21 0030  piperacillin-tazobactam (ZOSYN) IVPB 3.375 g  Status:  Discontinued        3.375 g 12.5 mL/hr over 240 Minutes Intravenous Every 8 hours 04/27/21 1650 05/04/21 1358   04/27/21 1603  vancomycin variable dose per unstable renal function (pharmacist dosing)  Status:  Discontinued         Does not apply See admin instructions 04/27/21 1604 04/28/21 1324   04/27/21 1545  piperacillin-tazobactam (ZOSYN) IVPB 3.375 g        3.375 g 100 mL/hr over 30 Minutes Intravenous  Once 04/27/21 1532 04/27/21 1707   04/27/21 1545  vancomycin (VANCOREADY) IVPB 1250 mg/250 mL        1,250 mg 166.7 mL/hr over 90 Minutes Intravenous  Once 04/27/21 1532 04/28/21 0305         Micro    Objective   Vitals:   05/06/21 2353 05/07/21 0423 05/07/21 0755 05/07/21 1157  BP: (!) 128/45 (!) 145/56 (!) 151/65 (!) 149/62  Pulse: 80 81 85 80  Resp: 16 19 19 16   Temp: 98.6 F (37 C) 98.2 F (36.8 C) 98.2 F (36.8 C) 98.8 F (37.1 C)  TempSrc: Oral Oral Oral Oral  SpO2: 98% 100% 99% 100%  Weight:      Height:        Intake/Output Summary (Last 24 hours) at 05/07/2021 1859 Last data filed at 05/07/2021 1800 Gross per 24 hour  Intake 1462.78 ml  Output 2500 ml  Net -1037.22 ml   Filed Weights   04/27/21 1500  Weight: 68.5 kg    Physical Exam:  General exam: laying in bed, awake, alert, no acute distress  Respiratory system: normal respiratory effort, on room air. Cardiovascular system: RRR, no pedal edema.   Central nervous system: A&O x4. no gross focal neurologic deficits, normal speech Extremities: moves all, left medial lower leg incision is healing well and without surrounding erythema or swelling  Labs    Data Reviewed: I have personally reviewed following labs and imaging studies  CBC: Recent Labs  Lab 05/03/21 0207 05/03/21 1554 05/04/21 0443 05/04/21 1407 05/05/21 0247 05/05/21 2252 05/07/21 0039  WBC 27.9*  --  21.1*  --  21.9* 20.8* 18.7*  HGB 9.7*   < > 6.9* 9.3* 8.5* 8.5* 7.7*  HCT 29.7*   < > 20.8* 28.0* 26.2* 25.9* 24.1*  MCV 90.3  --  89.7  --  88.5 88.7 89.9  PLT 434*  --  312  --  350 363 406*   < > = values in this interval not displayed.   Basic Metabolic Panel: Recent Labs  Lab 05/01/21 0146 05/02/21 0141 05/03/21 0207 05/03/21 1554 05/04/21 0443 05/05/21 0247 05/05/21 2252 05/07/21 0039  NA 136   < > 135 137 136 131* 133* 139  K 3.3*   < > 3.3* 3.6 4.4 3.7 3.6 4.1  CL 106   < > 103 107 109 104 106 111  CO2 19*   < > 19*  --  19* 18* 17* 21*  GLUCOSE 156*   < >  155* 114* 222* 281* 202* 222*  BUN 23   < > 18 16 25* 37* 37* 29*  CREATININE 1.43*   < > 1.28* 0.90 1.40* 1.60* 1.48* 1.15*  CALCIUM 8.2*   < > 8.6*  --  7.7* 7.9* 8.0* 8.3*  MG 2.0  --   --   --   --   --   --   --    < > = values in this interval not displayed.   GFR: Estimated Creatinine Clearance: 38.1 mL/min (A) (by C-G formula based on SCr of 1.15 mg/dL (H)). Liver Function Tests: No results for input(s): AST, ALT, ALKPHOS, BILITOT, PROT, ALBUMIN in the last 168 hours. No results for input(s): LIPASE, AMYLASE in the last 168 hours. No results for input(s): AMMONIA in the last 168 hours. Coagulation Profile: No results for input(s): INR, PROTIME in the last 168 hours.  Cardiac Enzymes: No results for input(s): CKTOTAL, CKMB, CKMBINDEX, TROPONINI in the last 168 hours. BNP (last 3 results) No results for input(s): PROBNP in the last 8760 hours. HbA1C: No results for input(s): HGBA1C in the last 72 hours. CBG: Recent Labs  Lab 05/06/21 1818 05/06/21 2154 05/07/21 0546 05/07/21 0757 05/07/21 1200  GLUCAP 170* 187* 209* 216* 180*   Lipid Profile: No results for input(s):  CHOL, HDL, LDLCALC, TRIG, CHOLHDL, LDLDIRECT in the last 72 hours. Thyroid Function Tests: No results for input(s): TSH, T4TOTAL, FREET4, T3FREE, THYROIDAB in the last 72 hours. Anemia Panel: No results for input(s): VITAMINB12, FOLATE, FERRITIN, TIBC, IRON, RETICCTPCT in the last 72 hours.  Sepsis Labs: No results for input(s): PROCALCITON, LATICACIDVEN in the last 168 hours.   Recent Results (from the past 240 hour(s))  Surgical PCR screen     Status: Abnormal   Collection Time: 04/27/21  9:31 PM   Specimen: Nasal Mucosa; Nasal Swab  Result Value Ref Range Status   MRSA, PCR NEGATIVE NEGATIVE Final   Staphylococcus aureus POSITIVE (A) NEGATIVE Final    Comment: (NOTE) The Xpert SA Assay (FDA approved for NASAL specimens in patients 47 years of age and older), is one component of a comprehensive surveillance program. It is not intended to diagnose infection nor to guide or monitor treatment. Performed at [facility], [place] [address] [place], [place] [address]   Aerobic/Anaerobic Culture w Gram Stain (surgical/deep wound)     Status: None (Preliminary result)   Collection Time: 05/03/21  5:42 PM   Specimen: PATH Other; Tissue  Result Value Ref Range Status   Specimen Description WOUND  Final   Special Requests LEFT THIRD TOE SWABS FOR CULTURE SPEC A  Final   Gram Stain   Final    RARE WBC PRESENT,BOTH PMN AND MONONUCLEAR RARE GRAM POSITIVE COCCI Performed at [facility], [place] [address], [place], [place] [address]    Culture   Final    FEW STAPHYLOCOCCUS AUREUS FEW STREPTOCOCCUS ANGINOSIS NO ANAEROBES ISOLATED; CULTURE IN PROGRESS FOR 5 DAYS    Report Status PENDING  Incomplete   Organism ID, Bacteria STAPHYLOCOCCUS AUREUS  Final   Organism ID, Bacteria STREPTOCOCCUS ANGINOSIS  Final      Susceptibility   Staphylococcus aureus - MIC*    CIPROFLOXACIN <=0.5 SENSITIVE Sensitive     ERYTHROMYCIN >=8 RESISTANT Resistant     GENTAMICIN <=0.5 SENSITIVE Sensitive      OXACILLIN 0.5 SENSITIVE Sensitive     TETRACYCLINE <=1 SENSITIVE Sensitive     VANCOMYCIN 1 SENSITIVE Sensitive  TRIMETH/SULFA <=10 SENSITIVE Sensitive     CLINDAMYCIN RESISTANT Resistant     RIFAMPIN <=0.5 SENSITIVE Sensitive     Inducible Clindamycin POSITIVE Resistant     * FEW STAPHYLOCOCCUS AUREUS   Streptococcus anginosis - MIC*    PENICILLIN <=0.06 SENSITIVE Sensitive     CEFTRIAXONE 0.25 SENSITIVE Sensitive     ERYTHROMYCIN <=0.12 SENSITIVE Sensitive     LEVOFLOXACIN 0.5 SENSITIVE Sensitive     VANCOMYCIN 0.5 SENSITIVE Sensitive     * FEW STREPTOCOCCUS ANGINOSIS      Imaging Studies   No results found.   Medications   Scheduled Meds:  ascorbic acid  250 mg Oral Q supper   aspirin EC  81 mg Oral QHS   atorvastatin  80 mg Oral Daily   brimonidine  1 drop Right Eye BID   Chlorhexidine Gluconate Cloth  6 each Topical Daily   docusate sodium  100 mg Oral Daily   insulin aspart  0-15 Units Subcutaneous TID WC   metoprolol tartrate  37.5 mg Oral BID   pantoprazole  40 mg Oral Once per day on Mon Wed Fri   polyethylene glycol  17 g Oral Daily   pyridoxine  100 mg Oral QPM   saccharomyces boulardii  250 mg Oral BID   sodium chloride flush  3 mL Intravenous Q12H   Continuous Infusions:  sodium chloride     cefTRIAXone (ROCEPHIN)  IV 2 g (05/07/21 0843)   heparin 1,750 Units/hr (05/07/21 1500)   magnesium sulfate bolus IVPB         LOS: 10 days    Time spent: 25 minutes with > 50% spent at bedside and in coordination of care     Ezekiel Slocumb, DO [facility]  05/07/2021, 6:59 PM      If 7PM-7AM, please contact night-coverage. How to contact the [facility] Attending or Consulting provider [place] or covering provider during after hours [place], for this patient?    Check the care team in [facility] and look for a) attending/consulting [facility] provider listed and b) the [facility] team listed Log into [email or web address] and use [place]'s universal password to  access. If you do not have the password, please contact the hospital operator. Locate the [facility] provider you are looking for under [facility] and page to a number that you can be directly reached. If you still have difficulty reaching the provider, please page the [facility] (Director on Call) for the Hospitalists listed on amion for assistance.

## 2021-05-07 NOTE — Progress Notes (Signed)
Subjective  - POD #4  No complaints this morning Has been taken off of COVID restrictions   Physical Exam:  Dressing remains clean dry and intact.  Incisions healing nicely  Brisk pedal Doppler signals       Assessment/Plan:  POD #4  Continue wet-to-dry dressing changes to the amputation site Patient would like to be evaluated by inpatient rehab.  [name] 05/07/2021 2:37 PM --  Vitals:   05/07/21 0755 05/07/21 1157  BP: (!) 151/65 (!) 149/62  Pulse: 85 80  Resp: 19 16  Temp: 98.2 F (36.8 C) 98.8 F (37.1 C)  SpO2: 99% 100%    Intake/Output Summary (Last 24 hours) at 05/07/2021 1437 Last data filed at 05/07/2021 1200 Gross per 24 hour  Intake 150 ml  Output 2050 ml  Net -1900 ml     Laboratory CBC    Component Value Date/Time   WBC 18.7 (H) 05/07/2021 0039   HGB 7.7 (L) 05/07/2021 0039   HGB 12.2 07/29/2020 0942   HCT 24.1 (L) 05/07/2021 0039   HCT 37.4 07/29/2020 0942   PLT 406 (H) 05/07/2021 0039   PLT 304 07/29/2020 0942    BMET    Component Value Date/Time   NA 139 05/07/2021 0039   NA 142 07/29/2020 0942   K 4.1 05/07/2021 0039   CL 111 05/07/2021 0039   CO2 21 (L) 05/07/2021 0039   GLUCOSE 222 (H) 05/07/2021 0039   BUN 29 (H) 05/07/2021 0039   BUN 17 07/29/2020 0942   CREATININE 1.15 (H) 05/07/2021 0039   CREATININE 0.86 12/09/2019 1136   CALCIUM 8.3 (L) 05/07/2021 0039   GFRNONAA 48 (L) 05/07/2021 0039   GFRAA 75 07/29/2020 0942    COAG Lab Results  Component Value Date   INR 1.1 04/27/2021   INR 1.0 08/10/2020   INR 1.8 (H) 03/24/2020   No results found for: PTT  Antibiotics Anti-infectives (From admission, onward)    Start     Dose/Rate Route Frequency Ordered Stop   05/07/21 0845  cefTRIAXone (ROCEPHIN) 2 g in sodium chloride 0.9 % 100 mL IVPB        2 g 200 mL/hr over 30 Minutes Intravenous Every 24 hours 05/07/21 0755     05/06/21 0930  vancomycin (VANCOREADY) IVPB 750 mg/150 mL  Status:  Discontinued         750 mg 150 mL/hr over 60 Minutes Intravenous Every 24 hours 05/06/21 0841 05/07/21 0758   05/06/21 0900  vancomycin (VANCOREADY) IVPB 1250 mg/250 mL  Status:  Discontinued        1,250 mg 166.7 mL/hr over 90 Minutes Intravenous Every 48 hours 05/05/21 1506 05/06/21 0841   04/28/21 1800  vancomycin (VANCOREADY) IVPB 750 mg/150 mL  Status:  Discontinued        750 mg 150 mL/hr over 60 Minutes Intravenous Every 24 hours 04/28/21 1324 05/05/21 1506   04/28/21 0030  piperacillin-tazobactam (ZOSYN) IVPB 3.375 g  Status:  Discontinued        3.375 g 12.5 mL/hr over 240 Minutes Intravenous Every 8 hours 04/27/21 1650 05/04/21 1358   04/27/21 1603  vancomycin variable dose per unstable renal function (pharmacist dosing)  Status:  Discontinued         Does not apply See admin instructions 04/27/21 1604 04/28/21 1324   04/27/21 1545  piperacillin-tazobactam (ZOSYN) IVPB 3.375 g        3.375 g 100 mL/hr over 30 Minutes Intravenous  Once 04/27/21 1532 04/27/21  1707   04/27/21 1545  vancomycin (VANCOREADY) IVPB 1250 mg/250 mL        1,250 mg 166.7 mL/hr over 90 Minutes Intravenous  Once 04/27/21 1532 04/28/21 0305        V. Leia Alf, M.D., [facility] [place] Office: [phone] Pager:  [phone]

## 2021-05-08 DIAGNOSIS — M86172 Other acute osteomyelitis, left ankle and foot: Secondary | ICD-10-CM | POA: Diagnosis not present

## 2021-05-08 LAB — CBC
HCT: 24.4 % — ABNORMAL LOW (ref 36.0–46.0)
Hemoglobin: 7.7 g/dL — ABNORMAL LOW (ref 12.0–15.0)
MCH: 28.4 pg (ref 26.0–34.0)
MCHC: 31.6 g/dL (ref 30.0–36.0)
MCV: 90 fL (ref 80.0–100.0)
Platelets: 437 10*3/uL — ABNORMAL HIGH (ref 150–400)
RBC: 2.71 MIL/uL — ABNORMAL LOW (ref 3.87–5.11)
RDW: 14.9 % (ref 11.5–15.5)
WBC: 18.6 10*3/uL — ABNORMAL HIGH (ref 4.0–10.5)
nRBC: 0.4 % — ABNORMAL HIGH (ref 0.0–0.2)

## 2021-05-08 LAB — GLUCOSE, CAPILLARY
Glucose-Capillary: 179 mg/dL — ABNORMAL HIGH (ref 70–99)
Glucose-Capillary: 209 mg/dL — ABNORMAL HIGH (ref 70–99)
Glucose-Capillary: 169 mg/dL — ABNORMAL HIGH (ref 70–99)
Glucose-Capillary: 193 mg/dL — ABNORMAL HIGH (ref 70–99)

## 2021-05-08 LAB — HEPARIN LEVEL (UNFRACTIONATED): Heparin Unfractionated: 0.49 IU/mL (ref 0.30–0.70)

## 2021-05-08 MED ORDER — DAKINS (1/4 STRENGTH) 0.125 % EX SOLN
Freq: Two times a day (BID) | CUTANEOUS | Status: AC
  Administered 2021-05-10: 1
  Filled 2021-05-08: qty 473

## 2021-05-08 NOTE — Progress Notes (Signed)
Occupational Therapy Treatment Patient Details Name: [name] MRN: [id number] DOB: 16-May-1941 Today's Date: 05/08/2021    History of present illness Pt adm 8/4 with lt third toe gangrene. Pt underwent abdominal aortogram with bilateral lower extremity runoff on 8/5. Pt underwent lt 3rd toe amputation  and lt common femoral - tibioperoneal trunk bypass on 8/10. Pt with incidental covid + finding. PMH - multiple LE vascular surgeries, rt transmetatarsal amputation, HTN, CABG, DM, PVD, diabetic neuropathy   OT comments  Pt progressing well towards OT goals, able to ambulate across room to/from sink using RW. Pt able to maintain static standing balance at sink without support and no LOB when using RW for mobility. Pt does require light assist to manage top strap on L darco shoe. Pt reports if L LE able to be propped on a stool, could likely manage without assist. Collab with pt on safety strategies for ADLs at home. Pt also reports she has neighbors that can assist if daughter unable to. Continue to believe pt appropriate for DC home with intermittent supervision.   HR 112bpm with activity   Follow Up Recommendations  Home health OT;Supervision - Intermittent    Equipment Recommendations  None recommended by OT    Recommendations for Other Services      Precautions / Restrictions Precautions Precautions: Fall Required Braces or Orthoses: Other Brace Other Brace: Darco shoe on L foot. Pt also wears Darco on R foot (for almost 2 years) Restrictions Weight Bearing Restrictions: Yes LLE Weight Bearing: Partial weight bearing Other Position/Activity Restrictions: weight bearing on heel       Mobility Bed Mobility Overal bed mobility: Needs Assistance Bed Mobility: Supine to Sit     Supine to sit: Modified independent (Device/Increase time);HOB elevated     General bed mobility comments: light use of bedrail    Transfers Overall transfer level: Needs assistance Equipment  used: Rolling walker (2 wheeled) Transfers: Sit to/from Stand Sit to Stand: Min guard         General transfer comment: min guard with increased time to power up from bedside with RW    Balance Overall balance assessment: Needs assistance Sitting-balance support: No upper extremity supported;Feet unsupported Sitting balance-Leahy Scale: Good     Standing balance support: Bilateral upper extremity supported Standing balance-Leahy Scale: Fair Standing balance comment: fair static standing at sink, BUE for mobility                           ADL either performed or assessed with clinical judgement   ADL Overall ADL's : Needs assistance/impaired     Grooming: Supervision/safety;Standing;Wash/dry face Grooming Details (indicate cue type and reason): standing at sink, no LOB noted             Lower Body Dressing: Minimal assistance;Sitting/lateral leans Lower Body Dressing Details (indicate cue type and reason): able to don R darco shoe but required assistance with the L (top strap only). Pt reports L LE swelling and unable to successfully cross LEs at this time. Pt independently problem solved likely able to don L shoe if propped up on a stool as this is what she did with R LE to start with Toilet Transfer: Min guard;Ambulation;RW           Functional mobility during ADLs: Min guard;Rolling walker General ADL Comments: Very light assist for L darco shoe mgmt, able to ambulate with darco shoe using RW without LOB or safety concerns. Pt does  report feeling off balance with L darco shoe heel but able to manage well. Encouraged sponge bathing at home initially to ensure safety. When asked about daughter's work schedule, pt reports she has neighbors nearby that can also assist     Vision   Vision Assessment?: No apparent visual deficits   Perception     Praxis      Cognition Arousal/Alertness: Awake/alert Behavior During Therapy: WFL for tasks  assessed/performed Overall Cognitive Status: Within Functional Limits for tasks assessed                                          Exercises     Shoulder Instructions       General Comments      Pertinent Vitals/ Pain       Pain Assessment: No/denies pain Pain Intervention(s): Monitored during session  Home Living                                          Prior Functioning/Environment              Frequency  Min 2X/week        Progress Toward Goals  OT Goals(current goals can now be found in the care plan section)  Progress towards OT goals: Progressing toward goals  Acute Rehab OT Goals Patient Stated Goal: decide rehab vs home OT Goal Formulation: With patient Time For Goal Achievement: 05/18/21 Potential to Achieve Goals: Good ADL Goals Pt Will Perform Grooming: with modified independence;standing Pt Will Perform Lower Body Dressing: with modified independence;sit to/from stand Pt Will Transfer to Toilet: with modified independence;ambulating  Plan Discharge plan remains appropriate    Co-evaluation                 AM-PAC OT "6 Clicks" Daily Activity     Outcome Measure   Help from another person eating meals?: None Help from another person taking care of personal grooming?: A Little Help from another person toileting, which includes using toliet, bedpan, or urinal?: A Little Help from another person bathing (including washing, rinsing, drying)?: A Little Help from another person to put on and taking off regular upper body clothing?: None Help from another person to put on and taking off regular lower body clothing?: A Little 6 Click Score: 20    End of Session Equipment Utilized During Treatment: Rolling walker;Gait belt  OT Visit Diagnosis: Unsteadiness on feet (R26.81);Other abnormalities of gait and mobility (R26.89)   Activity Tolerance Patient tolerated treatment well   Patient Left in bed;with call  bell/phone within reach (sitting EOB)   Nurse Communication Mobility status        Time: 9811-9147 OT Time Calculation (min): 21 min  Charges: OT General Charges $OT Visit: 1 Visit OT Treatments $Self Care/Home Management : 8-22 mins  Malachy Chamber, OTR/L Acute Rehab Services Office: [phone]    Layla Maw 05/08/2021, 7:31 AM

## 2021-05-08 NOTE — Progress Notes (Signed)
[facility] for heparin  Indication: graft patency s/p L common femoral-tibioperoneal trunk bypass   Allergies  Allergen Reactions   Bactrim [Sulfamethoxazole-Trimethoprim] Other (See Comments)    ^ K+( elevated)    Demerol [Meperidine]     Delusional    Scopolamine     Delusional    Tramadol Other (See Comments)    Keeps awake   Versed [Midazolam] Anxiety    Frantic, out of my mind, agitated     Patient Measurements: Height: 5' 4.5" (163.8 cm) Weight: 68.5 kg (151 lb 0.2 oz) IBW/kg (Calculated) : 55.85 Heparin Dosing Weight: 68kg  Vital Signs: Temp: 99.3 F (37.4 C) (08/15 0346) Temp Source: Oral (08/15 0346) BP: 135/47 (08/15 0346) Pulse Rate: 77 (08/15 0346)  Labs: Recent Labs    05/05/21 2252 05/06/21 1008 05/07/21 0039 05/08/21 0133 05/08/21 0321  HGB 8.5*  --  7.7* 7.7*  --   HCT 25.9*  --  24.1* 24.4*  --   PLT 363  --  406* 437*  --   HEPARINUNFRC 0.29* 0.60 0.26*  --  0.49  CREATININE 1.48*  --  1.15*  --   --      Estimated Creatinine Clearance: 38.1 mL/min (A) (by C-G formula based on SCr of 1.15 mg/dL (H)).    Assessment: 80 y.o. female admitted with osteo and grangrene of toe now s/p left common femoral bypass and toe amputation 8/10. No anticoagulation at home. Pharmacy consulted for therapeutic heparin dosing to promote graft patency. Per VVS, planning eventual transition to Xarelto 2.5 mg BID when H/H stable.   Heparin level therapeutic at 0.49, H/H low but stable, pltc ok.  Goal of Therapy:  Heparin level 0.3-0.7 units/ml Monitor platelets by anticoagulation protocol: Yes   Plan:  Continue heparin 1750 units/h Daily heparin level and CBC F/U transition to PAD dose Xarelto   Arrie Senate, PharmD, BCPS, [facility] Clinical Pharmacist [phone] Please check AMION for all [facility] numbers 05/08/2021

## 2021-05-08 NOTE — Progress Notes (Addendum)
VASCULAR SURGERY ASSESSMENT & PLAN:   POD 5 PAD: atherosclerosis of native arteries with dry gangrene of left third toe S/p 1) left common femoral - tibioperoneal trunk bypass with 3mm ringed ePTFE in anatomic tunnel and left third toe open amputation on 05/03/2021 by Dr. Stanford Breed VSS. LLE well perfused. Amp site not yet granualting. Fibrinous exudate of majority of wound. Begin Dakins damp to dry dressing  changes BID Continue high-intensity statin and aspirin. Plavix on hold.   DVT prophy/on heparin infusion to promote graft patency. Platelet count normal. Plan to transition to rivaroxaban 2.5mg  BID when H and H stable. Hgb is 7.7 again today. Last tansfused additional unit PCs on Saturday. Continue to monitor.   Leukocytosis. Tmax 99.3. On vancomycin. Cultures from left toe swabs grew staph and strep...both sensative to vanc.   Dispo: SNF  Follow-up with Dr. Stanford Breed today.  SUBJECTIVE:   No complaints  PHYSICAL EXAM:   Vitals:   05/07/21 2017 05/07/21 2114 05/07/21 2313 05/08/21 0346  BP: (!) 177/72 (!) 180/59 (!) 141/42 (!) 135/47  Pulse: (!) 104 93 80 77  Resp: 11 15 14 16   Temp: 98.2 F (36.8 C) 98.5 F (36.9 C) 98.4 F (36.9 C) 99.3 F (37.4 C)  TempSrc: Oral Oral Oral Oral  SpO2: 100% 100% 94% 96%  Weight:      Height:       General appearance: Awake, alert in no apparent distress Cardiac: Heart rate and rhythm are regular Respirations: Nonlabored Incisions: Right groin and lower leg incisions are all well approximated without bleeding or hematoma Extremities: Both feet are warm with intact sensation and motor function.  Left 3rd toe amp site with fibrinous exudate; not yet granulating. Pulse/Doppler exam: + left dorsalis pedis, PT and peroneal artery Doppler signals        LABS:   Lab Results  Component Value Date   WBC 18.6 (H) 05/08/2021   HGB 7.7 (L) 05/08/2021   HCT 24.4 (L) 05/08/2021   MCV 90.0 05/08/2021   PLT 437 (H) 05/08/2021   Lab  Results  Component Value Date   CREATININE 1.15 (H) 05/07/2021   Lab Results  Component Value Date   INR 1.1 04/27/2021   CBG (last 3)  Recent Labs    05/07/21 1200 05/07/21 2109 05/08/21 0622  GLUCAP 180* 212* 179*   Susceptibility   Staphylococcus aureus Streptococcus anginosis    MIC MIC    CEFTRIAXONE   0.25 SENSIT... Sensitive    CIPROFLOXACIN <=0.5 SENSI... Sensitive      CLINDAMYCIN RESISTANT  Resistant      ERYTHROMYCIN >=8 RESISTANT  Resistant <=0.12 SENS... Sensitive    GENTAMICIN <=0.5 SENSI... Sensitive      Inducible Clindamycin POSITIVE  Resistant      LEVOFLOXACIN   0.5 SENSITIVE  Sensitive    OXACILLIN 0.5 SENSITIVE  Sensitive      PENICILLIN   <=0.06 SENS... Sensitive    RIFAMPIN <=0.5 SENSI... Sensitive      TETRACYCLINE <=1 SENSITIVE  Sensitive      TRIMETH/SULFA <=10 SENSIT... Sensitive      VANCOMYCIN 1 SENSITIVE  Sensitive 0.5 SENSITIVE  Sensitive             PROBLEM LIST:    Principal Problem:   Osteomyelitis of foot, left, acute (HCC) Active Problems:   Essential hypertension   PAD (peripheral artery disease) (HCC)   Cellulitis in diabetic foot (HCC)   Acute kidney injury superimposed on chronic kidney disease ([place])  Hyponatremia   Anemia, chronic disease   Cellulitis of left lower extremity   CURRENT MEDS:    ascorbic acid  250 mg Oral Q supper   aspirin EC  81 mg Oral QHS   atorvastatin  80 mg Oral Daily   brimonidine  1 drop Right Eye BID   Chlorhexidine Gluconate Cloth  6 each Topical Daily   docusate sodium  100 mg Oral Daily   insulin aspart  0-15 Units Subcutaneous TID WC   metoprolol tartrate  37.5 mg Oral BID   pantoprazole  40 mg Oral Once per day on Mon Wed Fri   polyethylene glycol  17 g Oral Daily   pyridoxine  100 mg Oral QPM   saccharomyces boulardii  250 mg Oral BID   sodium chloride flush  3 mL Intravenous Q12H    Barbie Banner, [place]  Office: [phone] 05/08/2021   VASCULAR STAFF ADDENDUM: I have  independently interviewed and examined the patient. I agree with the above.  Bypass appears to be working well. Severe foot infection may need further amputation / debridement.  Will continue local care for now - BID Dakins. Can transition to oral antibiotics (? Keflex if all agree). Continue anticoagulation for bypass patency.  OK to transition to Xarelto 2.5mg  PO BID. Disposition planning. May be ready for DC with [facility] or SNF in next 48 hours.  [name]. Stanford Breed, MD [facility] Phone Number: [phone] 05/08/2021 1:36 PM

## 2021-05-08 NOTE — Care Management Important Message (Signed)
Important Message  Patient Details  Name: CENIA ZARAGOSA MRN: [id number] Date of Birth: 06/25/1941   Medicare Important Message Given:  Yes     Shelda Altes 05/08/2021, 9:38 AM

## 2021-05-08 NOTE — Progress Notes (Signed)
Physical Therapy Treatment Patient Details Name: [name] MRN: [id number] DOB: January 04, 1941 Today's Date: 05/08/2021    History of Present Illness Pt adm 8/4 with lt third toe gangrene. Pt underwent abdominal aortogram with bilateral lower extremity runoff on 8/5. Pt underwent lt 3rd toe amputation  and lt common femoral - tibioperoneal trunk bypass on 8/10. Pt with incidental covid + finding. PMH - multiple LE vascular surgeries, rt transmetatarsal amputation, HTN, CABG, DM, PVD, diabetic neuropathy    PT Comments    Patient in bed, agrees to PT session. She reports she just ordered lunch. Requests to use BSC prior to walking. Requires assistance to don left post op shoe. She is mod independent with bed mobility, has some difficulty transferring from sit to stand. Increased time and effort to stand from bed and BSC and then poor eccentric control to sit back down. Ambulated 100 feet with RW and cues for heel weight bearing in shoe. Min guard. Patient will continue to benefit from skilled PT while here to improve safety, strength and independence with mobility.     Follow Up Recommendations  SNF;Supervision for mobility/OOB     Equipment Recommendations  None recommended by PT    Recommendations for Other Services       Precautions / Restrictions Precautions Precautions: Fall Other Brace: Darco shoe on L foot. Pt also wears Darco on R foot (for almost 2 years) Restrictions Weight Bearing Restrictions: Yes LLE Weight Bearing: Partial weight bearing Other Position/Activity Restrictions: weight bearing on heel    Mobility  Bed Mobility Overal bed mobility: Modified Independent Bed Mobility: Supine to Sit     Supine to sit: Modified independent (Device/Increase time);HOB elevated     General bed mobility comments: light use of bedrail    Transfers Overall transfer level: Needs assistance Equipment used: Rolling walker (2 wheeled) Transfers: Sit to/from [employer] Sit to Stand: Min assist Stand pivot transfers: Min guard       General transfer comment: increased time and effort to stand from bed and BSC.  Ambulation/Gait Ambulation/Gait assistance: Min guard Gait Distance (Feet): 100 Feet Assistive device: Rolling walker (2 wheeled) Gait Pattern/deviations: Step-to pattern;Decreased weight shift to left Gait velocity: decr   General Gait Details: Assist for balance and support. Cues to keep weight on heel of left foot. Reports legs fatigued after walking   Stairs             Wheelchair Mobility    Modified Rankin (Stroke Patients Only)       Balance Overall balance assessment: Needs assistance Sitting-balance support: Feet supported Sitting balance-Leahy Scale: Good     Standing balance support: Bilateral upper extremity supported;During functional activity Standing balance-Leahy Scale: Fair Standing balance comment: fair static standing at sink, BUE for mobility                            Cognition Arousal/Alertness: Awake/alert Behavior During Therapy: WFL for tasks assessed/performed Overall Cognitive Status: Within Functional Limits for tasks assessed                                        Exercises      General Comments        Pertinent Vitals/Pain Pain Assessment: Faces Faces Pain Scale: Hurts a little bit Pain Location: LLE Pain Descriptors / Indicators: Discomfort Pain Intervention(s): Monitored  during session    Home Living                      Prior Function            PT Goals (current goals can now be found in the care plan section) Acute Rehab PT Goals Patient Stated Goal: decide rehab vs home PT Goal Formulation: With patient Time For Goal Achievement: 05/18/21 Potential to Achieve Goals: Fair Progress towards PT goals: Progressing toward goals    Frequency    Min 3X/week      PT Plan Current plan remains appropriate     Co-evaluation              AM-PAC PT "6 Clicks" Mobility   Outcome Measure  Help needed turning from your back to your side while in a flat bed without using bedrails?: None Help needed moving from lying on your back to sitting on the side of a flat bed without using bedrails?: A Little Help needed moving to and from a bed to a chair (including a wheelchair)?: A Little Help needed standing up from a chair using your arms (e.g., wheelchair or bedside chair)?: A Little Help needed to walk in hospital room?: A Little Help needed climbing 3-5 steps with a railing? : A Lot 6 Click Score: 18    End of Session Equipment Utilized During Treatment: Gait belt Activity Tolerance: Patient tolerated treatment well;Patient limited by fatigue Patient left: in bed;with call bell/phone within reach Nurse Communication: Mobility status PT Visit Diagnosis: Unsteadiness on feet (R26.81);Other abnormalities of gait and mobility (R26.89);Pain;Muscle weakness (generalized) (M62.81);Difficulty in walking, not elsewhere classified (R26.2) Pain - Right/Left: Left Pain - part of body: Leg     Time: 1315-1336 PT Time Calculation (min) (ACUTE ONLY): 21 min  Charges:  $Gait Training: 8-22 mins                    Corlene Sabia, PT, GCS 05/08/21,2:15 PM

## 2021-05-08 NOTE — Progress Notes (Signed)
Inpatient Rehab Admissions Coordinator Note:   Per pt request, pt was screened for CIR candidacy by Shann Medal, PT, DPT.  At this time note mobilizing well with therapy and has 24/7 assistance at home from family/children.  We would not have a bed for this patient for at least several days, and given her high level of mobility, would not likely qualify by the time we were able to consider a possible admit.  Would not recommend a consult at this time.  SNF recommendations are appropriate.  Please contact me with questions.   Shann Medal, PT, DPT [phone] 05/08/21 4:17 PM

## 2021-05-08 NOTE — Progress Notes (Addendum)
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 --Continue ASA and statin --Plavix on hold --On heparin gtt for graft patency --plan for d/c on Xarelto 2.5 mg BID  Sepsis due to osteomyelitis, cellulitis -sepsis POA.  Sepsis physiology has improved.  Angiogram on 8/5 showing severe left superficial femoral artery occlusive disease with 3 cm occlusion of the left popliteal artery, 80% stenosis of the distal right external iliac artery above existing femoropopliteal bypass. 8/10 - left common femoral - tibioperoneal trunk bypass with 3mm ringed ePTFE in anatomic tunnel left third toe open amputation --Vascular surgery and podiatry are consulted --Continue IV vancomycin  -- On heparin drip for graft patency --Stop Zosyn given cultures with GPC's (8/11) --Blood cultures negative  Vascular Recs: OOB/mobilize Wet-to-dry dressing changes BID  May transition to [facility] once infection cleared. Cultures pending. Continue antibiotics x 5 days. PT / OT / OOB / Ambulate with heel weightbearing  Acute kidney injury -due to prerenal azotemia, had improved but creatinine now rising some again.  Suspect due to poor p.o. intake --Monitor BMP --Encourage p.o. intake and will run some fluids if needed tomorrow if still worsening  Hyponatremia -mild, sodium 131 on morning labs.  Suspect poor p.o. intake.  We will hold off  on IV fluids for now and encourage p.o. intake.  IV fluids tomorrow if not improving.  COVID-19 infection -incidentally positive and asymptomatic. --Monitor clinically --Airborne isolation x10 days  Essential hypertension -continue metoprolol.  HCTZ and myocarditis are on hold.  Type 2 diabetes -uncontrolled with A1c 7.7 in April. --Hold oral meds --Sliding scale NovoLog for now  Chronic iron deficiency anemia -iron panel consistent with severe iron deficiency. -- Consider IV iron infusion postoperatively  CAD status post CABG in 03/2020 -continue aspirin and statin.  Plavix on hold.   Patient BMI: Body mass index is 25.52 kg/m.   DVT prophylaxis: SCD's Start: 05/03/21 2005  Lovenox held for surgery   Diet:  Diet Orders (From admission, onward)     Start     Ordered   05/05/21 2219  Diet Carb Modified Fluid consistency: Thin; Room service appropriate? Yes  Diet effective now       Question Answer Comment  Diet-HS Snack? Nothing   Calorie Level Medium 1600-2000   Fluid consistency: Thin   Room service appropriate? Yes      05/05/21 2218              Code Status: Full Code   Brief Narrative / Hospital Course to Date:   "DELESA [name] is a 80 y.o. female with medical history significant of hypertension, hyperlipidemia, CAD s/p CABG in 2021, PVD s/p bypass grafting, diabetes mellitus type II, ray amputation of right foot presented with left third toe wound.  developed redness pain and streaking going up her left leg associated with fevers and chills . She is s/p  right common femoral endarterectomy with femoral popliteal bypass graft on 08/16/2020.  And previously had angioplasty with stent continue to have the left SFA. In the ED she was febrile, mildly tachycardic, WBC was 20.8K, x-ray of the left foot noted osteomyelitis of distal phalanx third toe. -Was also incidentally positive for COVID, is asymptomatic -Vascular following, underwent angiogram on 8/5, noted to have  severe left superficial femoral artery occlusive disease, plan for left fem-pop bypass tomorrow. Improving on antibiotics, still with intermittent fevers"  Subjective 05/08/21    Patient up in recliner when seen today.  She reports not needing any pain medications.  Only has significant pain with moving around but it resolves on its own.  She mentions that her daughter works and she may need to go to a SNF instead of home initially.   Disposition Plan & Communication   Status is: Inpatient  Remains inpatient appropriate because: Further procedures are planned as outlined above  Dispo: The patient is from: Home              Anticipated d/c is to: Home              Patient currently is not medically stable to d/c.   Difficult to place patient No    Consults, Procedures, Significant Events   Consultants:  Vascular surgery Podiatry  Procedures:  8/5 - Ultrasound right groin 8/5 - Abdominal aortogram with bilateral lower extremity runoff 8/10 - left common femoral - tibioperoneal trunk bypass with 66mm ringed ePTFE in anatomic tunnel 8/10 - left third toe open amputation  Antimicrobials:  Anti-infectives (From admission, onward)    Start     Dose/Rate Route Frequency Ordered Stop   05/07/21 0845  cefTRIAXone (ROCEPHIN) 2 g in sodium chloride 0.9 % 100 mL IVPB        2 g 200 mL/hr over 30 Minutes Intravenous Every 24 hours 05/07/21 0755     05/06/21 0930  vancomycin (VANCOREADY) IVPB 750 mg/150 mL  Status:  Discontinued        750 mg 150 mL/hr over 60 Minutes Intravenous Every 24 hours 05/06/21 0841 05/07/21 0758   05/06/21 0900  vancomycin (VANCOREADY) IVPB 1250 mg/250 mL  Status:  Discontinued        1,250 mg 166.7 mL/hr over 90 Minutes Intravenous Every 48 hours 05/05/21 1506 05/06/21 0841   04/28/21 1800  vancomycin (VANCOREADY) IVPB 750 mg/150 mL  Status:  Discontinued        750 mg 150 mL/hr over 60 Minutes Intravenous Every 24 hours 04/28/21 1324 05/05/21 1506    04/28/21 0030  piperacillin-tazobactam (ZOSYN) IVPB 3.375 g  Status:  Discontinued        3.375 g 12.5 mL/hr over 240 Minutes Intravenous Every 8 hours 04/27/21 1650 05/04/21 1358   04/27/21 1603  vancomycin variable dose per unstable renal function (pharmacist dosing)  Status:  Discontinued         Does not apply See admin instructions 04/27/21 1604 04/28/21 1324   04/27/21 1545  piperacillin-tazobactam (ZOSYN) IVPB 3.375 g        3.375 g 100 mL/hr over 30 Minutes Intravenous  Once 04/27/21 1532 04/27/21 1707   04/27/21 1545  vancomycin (VANCOREADY) IVPB 1250 mg/250 mL        1,250 mg 166.7 mL/hr over 90 Minutes Intravenous  Once 04/27/21 1532 04/28/21 0305         Micro    Objective   Vitals:   05/08/21 0346 05/08/21 0906 05/08/21 0912  05/08/21 1644  BP: (!) 135/47 (!) 173/55 (!) 160/53 (!) 144/52  Pulse: 77 98    Resp: 16  17 17   Temp: 99.3 F (37.4 C)  98 F (36.7 C) 98.6 F (37 C)  TempSrc: Oral  Oral Oral  SpO2: 96%  100% 100%  Weight:      Height:        Intake/Output Summary (Last 24 hours) at 05/08/2021 1754 Last data filed at 05/08/2021 0444 Gross per 24 hour  Intake 680.11 ml  Output 775 ml  Net -94.89 ml   Filed Weights   04/27/21 1500  Weight: 68.5 kg    Physical Exam:  General exam: in recliner, awake, alert, no mild distress due to pain Respiratory system: CTAB, normal respiratory effort, on room air. Cardiovascular system: RRR, no pedal edema.   Central nervous system: A&O x4. no gross focal neurologic deficits, normal speech Skin: dry, intact, normal temp and color Psychiatry: normal mood, congruent affect, judgement and insight appear normal  Labs   Data Reviewed: I have personally reviewed following labs and imaging studies  CBC: Recent Labs  Lab 05/04/21 0443 05/04/21 1407 05/05/21 0247 05/05/21 2252 05/07/21 0039 05/08/21 0133  WBC 21.1*  --  21.9* 20.8* 18.7* 18.6*  HGB 6.9* 9.3* 8.5* 8.5* 7.7* 7.7*  HCT 20.8* 28.0* 26.2*  25.9* 24.1* 24.4*  MCV 89.7  --  88.5 88.7 89.9 90.0  PLT 312  --  350 363 406* 712*   Basic Metabolic Panel: Recent Labs  Lab 05/03/21 0207 05/03/21 1554 05/04/21 0443 05/05/21 0247 05/05/21 2252 05/07/21 0039  NA 135 137 136 131* 133* 139  K 3.3* 3.6 4.4 3.7 3.6 4.1  CL 103 107 109 104 106 111  CO2 19*  --  19* 18* 17* 21*  GLUCOSE 155* 114* 222* 281* 202* 222*  BUN 18 16 25* 37* 37* 29*  CREATININE 1.28* 0.90 1.40* 1.60* 1.48* 1.15*  CALCIUM 8.6*  --  7.7* 7.9* 8.0* 8.3*   GFR: Estimated Creatinine Clearance: 38.1 mL/min (A) (by C-G formula based on SCr of 1.15 mg/dL (H)). Liver Function Tests: No results for input(s): AST, ALT, ALKPHOS, BILITOT, PROT, ALBUMIN in the last 168 hours. No results for input(s): LIPASE, AMYLASE in the last 168 hours. No results for input(s): AMMONIA in the last 168 hours. Coagulation Profile: No results for input(s): INR, PROTIME in the last 168 hours.  Cardiac Enzymes: No results for input(s): CKTOTAL, CKMB, CKMBINDEX, TROPONINI in the last 168 hours. BNP (last 3 results) No results for input(s): PROBNP in the last 8760 hours. HbA1C: No results for input(s): HGBA1C in the last 72 hours. CBG: Recent Labs  Lab 05/07/21 1200 05/07/21 2109 05/08/21 0622 05/08/21 1141 05/08/21 1608  GLUCAP 180* 212* 179* 209* 169*   Lipid Profile: No results for input(s): CHOL, HDL, LDLCALC, TRIG, CHOLHDL, LDLDIRECT in the last 72 hours. Thyroid Function Tests: No results for input(s): TSH, T4TOTAL, FREET4, T3FREE, THYROIDAB in the last 72 hours. Anemia Panel: No results for input(s): VITAMINB12, FOLATE, FERRITIN, TIBC, IRON, RETICCTPCT in the last 72 hours.  Sepsis Labs: No results for input(s): PROCALCITON, LATICACIDVEN in the last 168 hours.   Recent Results (from the past 240 hour(s))  Fungus Culture With Stain     Status: Abnormal (Preliminary result)   Collection Time: 05/03/21  5:42 PM   Specimen: PATH Other; Tissue  Result Value Ref  Range Status   Fungus Stain Final report (A)  Final    Comment: (NOTE) Performed  At: [facility] [place], [place] [id number] Rush Farmer MD [id number]    Fungus (Mycology) Culture PENDING  Incomplete   Fungal Source WOUND  Final    Comment: LEFT THIRD TOE SWABS FOR CULTURE SPEC A Performed at [facility], [place] [address] [place], [place] [address]   Aerobic/Anaerobic Culture w Gram Stain (surgical/deep wound)     Status: None (Preliminary result)   Collection Time: 05/03/21  5:42 PM   Specimen: PATH Other; Tissue  Result Value Ref Range Status   Specimen Description WOUND  Final   Special Requests LEFT THIRD TOE SWABS FOR CULTURE SPEC A  Final   Gram Stain   Final    RARE WBC PRESENT,BOTH PMN AND MONONUCLEAR RARE GRAM POSITIVE COCCI Performed at [facility], [place] [address], [place], [place] [address]    Culture   Final    FEW STAPHYLOCOCCUS AUREUS FEW STREPTOCOCCUS ANGINOSIS NO ANAEROBES ISOLATED; CULTURE IN PROGRESS FOR 5 DAYS    Report Status PENDING  Incomplete   Organism ID, Bacteria STAPHYLOCOCCUS AUREUS  Final   Organism ID, Bacteria STREPTOCOCCUS ANGINOSIS  Final      Susceptibility   Staphylococcus aureus - MIC*    CIPROFLOXACIN <=0.5 SENSITIVE Sensitive     ERYTHROMYCIN >=8 RESISTANT Resistant     GENTAMICIN <=0.5 SENSITIVE Sensitive     OXACILLIN 0.5 SENSITIVE Sensitive     TETRACYCLINE <=1 SENSITIVE Sensitive     VANCOMYCIN 1 SENSITIVE Sensitive     TRIMETH/SULFA <=10 SENSITIVE Sensitive     CLINDAMYCIN RESISTANT Resistant     RIFAMPIN <=0.5 SENSITIVE Sensitive     Inducible Clindamycin POSITIVE Resistant     * FEW STAPHYLOCOCCUS AUREUS   Streptococcus anginosis - MIC*    PENICILLIN <=0.06 SENSITIVE Sensitive     CEFTRIAXONE 0.25 SENSITIVE Sensitive     ERYTHROMYCIN <=0.12 SENSITIVE Sensitive     LEVOFLOXACIN 0.5 SENSITIVE Sensitive     VANCOMYCIN 0.5 SENSITIVE Sensitive     * FEW STREPTOCOCCUS ANGINOSIS  Fungus  Culture Result     Status: Abnormal   Collection Time: 05/03/21  5:42 PM  Result Value Ref Range Status   Result 1 Comment (A)  Final    Comment: (NOTE) Fungal elements, such as arthroconidia, hyphal fragments, chlamydoconidia, observed. Performed At: [facility] [place], [place] [id number] Rush Farmer MD [id number]       Imaging Studies   No results found.   Medications   Scheduled Meds:  ascorbic acid  250 mg Oral Q supper   aspirin EC  81 mg Oral QHS   atorvastatin  80 mg Oral Daily   brimonidine  1 drop Right Eye BID   Chlorhexidine Gluconate Cloth  6 each Topical Daily   docusate sodium  100 mg Oral Daily   insulin aspart  0-15 Units Subcutaneous TID WC   metoprolol tartrate  37.5 mg Oral BID   pantoprazole  40 mg Oral Once per day on Mon Wed Fri   polyethylene glycol  17 g Oral Daily   pyridoxine  100 mg Oral QPM   saccharomyces boulardii  250 mg Oral BID   sodium chloride flush  3 mL Intravenous Q12H   sodium hypochlorite   Irrigation BID   Continuous Infusions:  sodium chloride     cefTRIAXone (ROCEPHIN)  IV 2 g (05/08/21 0904)   heparin 1,750 Units/hr (05/08/21 0920)   magnesium sulfate bolus IVPB  LOS: 11 days    Time spent: 25 minutes with > 50% spent at bedside and in coordination of care     Ezekiel Slocumb, DO [facility]  05/08/2021, 5:54 PM      If 7PM-7AM, please contact night-coverage. How to contact the [facility] Attending or Consulting provider [place] or covering provider during after hours [place], for this patient?    Check the care team in [facility] and look for a) attending/consulting [facility] provider listed and b) the [facility] team listed Log into [email or web address] and use 's universal password to access. If you do not have the password, please contact the hospital operator. Locate the [facility] provider you are looking for under [facility] and page to a number that you can be directly  reached. If you still have difficulty reaching the provider, please page the [facility] (Director on Call) for the Hospitalists listed on amion for assistance.  PROGRESS NOTE    [name]   CZY:[id number]  DOB: Feb 15, 1941  PCP: [name], MD    DOA: 04/27/2021 LOS: 11   Assessment & Plan   Principal Problem:   Osteomyelitis of foot, left, acute ([place]) Active Problems:   Essential hypertension   PAD (peripheral artery disease) ([place])   Cellulitis in diabetic foot ([place])   Acute kidney injury superimposed on chronic kidney disease ([facility])   Hyponatremia   Anemia, chronic disease   Cellulitis of left lower extremity   Sepsis due to osteomyelitis, cellulitis -sepsis POA.  Sepsis physiology has improved. Status post angiogram on 8/5 showing severe left superficial femoral artery occlusive disease with 3 cm occlusion of the left popliteal artery, 80% stenosis of the distal right external iliac artery above existing femoropopliteal bypass --Continue IV vancomycin  --Stop Zosyn given cultures with GPC's (8/11) --Blood cultures negative --Vascular surgery and podiatry are consulted Vascular Recs: OOB/mobilize Wet-to-dry dressing changes BID  May transition to [facility] once infection cleared. Cultures pending. Continue antibiotics x 5 days. PT / OT / OOB / Ambulate with heel weightbearing --Hold Plavix until okay from surgical standpoint  Left great toe gangrene Peripheral arterial disease -- On heparin drip for graft patency --Plan to discharge on Xarelto 2.5 mg BID\ --Continue aspirin and statin --Hold Plavix  Acute kidney injury - due to prerenal azotemia, had improved but creatinine now rising some again.  Suspect due to poor p.o. intake.  Also having urinary retention.  Cr 8/13 improved somewhat after Foley placed 8/12. --Monitor BMP --Foley for now --Encourage p.o. intake and will run some fluids if needed tomorrow if still worsening  Acute urinary retention -Foley  placed on 05/05/2021 after patient requiring and out cath and still having retention on bladder scan. -- Voiding trial today -- Trial of Flomax or other medication to help if still retaining  Hyponatremia -mild, sodium 131 on morning labs.  Suspect poor p.o. intake.  We will hold off on IV fluids for now and encourage p.o. intake.  IV fluids tomorrow if not improving.  COVID-19 infection -incidentally positive and asymptomatic. --Monitor clinically --Airborne isolation x10 days  Essential hypertension -continue metoprolol.  HCTZ and myocarditis are on hold.  Type 2 diabetes -uncontrolled with A1c 7.7 in April. --Hold oral meds --Sliding scale NovoLog for now  Chronic iron deficiency anemia -iron panel consistent with severe iron deficiency. -- Consider IV iron infusion postoperatively  CAD status post CABG in 03/2020 -continue aspirin and statin.  Plavix on hold.  Peripheral vascular disease -status post right  foot ray amputation back in November 2020.  Continue aspirin and statin.  Plavix on hold.   Patient BMI: Body mass index is 25.52 kg/m.   DVT prophylaxis: SCD's Start: 05/03/21 2005  Lovenox held for surgery   Diet:  Diet Orders (From admission, onward)     Start     Ordered   05/05/21 2219  Diet Carb Modified Fluid consistency: Thin; Room service appropriate? Yes  Diet effective now       Question Answer Comment  Diet-HS Snack? Nothing   Calorie Level Medium 1600-2000   Fluid consistency: Thin   Room service appropriate? Yes      05/05/21 2218              Code Status: Full Code   Brief Narrative / Hospital Course to Date:   "[name] is a 80 y.o. female with medical history significant of hypertension, hyperlipidemia, CAD s/p CABG in 2021, PVD s/p bypass grafting, diabetes mellitus type II, ray amputation of right foot presented with left third toe wound.  developed redness pain and streaking going up her left leg associated with fevers and chills . She  is s/p right common femoral endarterectomy with femoral popliteal bypass graft on 08/16/2020.  And previously had angioplasty with stent continue to have the left SFA. In the ED she was febrile, mildly tachycardic, WBC was 20.8K, x-ray of the left foot noted osteomyelitis of distal phalanx third toe. -Was also incidentally positive for COVID, is asymptomatic -Vascular following, underwent angiogram on 8/5, noted to have severe left superficial femoral artery occlusive disease, plan for left fem-pop bypass tomorrow. Improving on antibiotics, still with intermittent fevers"  Subjective 05/08/21    Patient seated edge of bed when seen this morning.  She reports overall feeling well.  No fevers or chills, no cough or shortness of breath.  No acute complaints.     Disposition Plan & Communication   Status is: Inpatient  Remains inpatient appropriate because: Further procedures are planned as outlined above  Dispo: The patient is from: Home              Anticipated d/c is to: Home              Patient currently is not medically stable to d/c.   Difficult to place patient No    Consults, Procedures, Significant Events   Consultants:  Vascular surgery Podiatry  Procedures:  8/5 - Ultrasound right groin 8/5 - Abdominal aortogram with bilateral lower extremity runoff 8/10 - left common femoral - tibioperoneal trunk bypass with 33mm ringed ePTFE in anatomic tunnel 8/10 - left third toe open amputation  Antimicrobials:  Anti-infectives (From admission, onward)    Start     Dose/Rate Route Frequency Ordered Stop   05/07/21 0845  cefTRIAXone (ROCEPHIN) 2 g in sodium chloride 0.9 % 100 mL IVPB        2 g 200 mL/hr over 30 Minutes Intravenous Every 24 hours 05/07/21 0755     05/06/21 0930  vancomycin (VANCOREADY) IVPB 750 mg/150 mL  Status:  Discontinued        750 mg 150 mL/hr over 60 Minutes Intravenous Every 24 hours 05/06/21 0841 05/07/21 0758   05/06/21 0900  vancomycin (VANCOREADY)  IVPB 1250 mg/250 mL  Status:  Discontinued        1,250 mg 166.7 mL/hr over 90 Minutes Intravenous Every 48 hours 05/05/21 1506 05/06/21 0841   04/28/21 1800  vancomycin (VANCOREADY) IVPB 750 mg/150 mL  Status:  Discontinued        750 mg 150 mL/hr over 60 Minutes Intravenous Every 24 hours 04/28/21 1324 05/05/21 1506   04/28/21 0030  piperacillin-tazobactam (ZOSYN) IVPB 3.375 g  Status:  Discontinued        3.375 g 12.5 mL/hr over 240 Minutes Intravenous Every 8 hours 04/27/21 1650 05/04/21 1358   04/27/21 1603  vancomycin variable dose per unstable renal function (pharmacist dosing)  Status:  Discontinued         Does not apply See admin instructions 04/27/21 1604 04/28/21 1324   04/27/21 1545  piperacillin-tazobactam (ZOSYN) IVPB 3.375 g        3.375 g 100 mL/hr over 30 Minutes Intravenous  Once 04/27/21 1532 04/27/21 1707   04/27/21 1545  vancomycin (VANCOREADY) IVPB 1250 mg/250 mL        1,250 mg 166.7 mL/hr over 90 Minutes Intravenous  Once 04/27/21 1532 04/28/21 0305         Micro    Objective   Vitals:   05/08/21 0346 05/08/21 0906 05/08/21 0912 05/08/21 1644  BP: (!) 135/47 (!) 173/55 (!) 160/53 (!) 144/52  Pulse: 77 98    Resp: 16  17 17   Temp: 99.3 F (37.4 C)  98 F (36.7 C) 98.6 F (37 C)  TempSrc: Oral  Oral Oral  SpO2: 96%  100% 100%  Weight:      Height:        Intake/Output Summary (Last 24 hours) at 05/08/2021 1754 Last data filed at 05/08/2021 0444 Gross per 24 hour  Intake 680.11 ml  Output 775 ml  Net -94.89 ml   Filed Weights   04/27/21 1500  Weight: 68.5 kg    Physical Exam:  General exam: seated edge of bed, awake, alert, no acute distress  Respiratory system: CTAB, normal respiratory effort, on room air.  No wheezes rales or rhonchi. Cardiovascular system: RRR, no pedal edema.   Central nervous system: A&O x4. no gross focal neurologic deficits, normal speech Extremities: moves all, left medial lower leg incision is healing well and  without surrounding erythema or swelling, clean dry and intact bandage on the left foot  Labs   Data Reviewed: I have personally reviewed following labs and imaging studies  CBC: Recent Labs  Lab 05/04/21 0443 05/04/21 1407 05/05/21 0247 05/05/21 2252 05/07/21 0039 05/08/21 0133  WBC 21.1*  --  21.9* 20.8* 18.7* 18.6*  HGB 6.9* 9.3* 8.5* 8.5* 7.7* 7.7*  HCT 20.8* 28.0* 26.2* 25.9* 24.1* 24.4*  MCV 89.7  --  88.5 88.7 89.9 90.0  PLT 312  --  350 363 406* 191*   Basic Metabolic Panel: Recent Labs  Lab 05/03/21 0207 05/03/21 1554 05/04/21 0443 05/05/21 0247 05/05/21 2252 05/07/21 0039  NA 135 137 136 131* 133* 139  K 3.3* 3.6 4.4 3.7 3.6 4.1  CL 103 107 109 104 106 111  CO2 19*  --  19* 18* 17* 21*  GLUCOSE 155* 114* 222* 281* 202* 222*  BUN 18 16 25* 37* 37* 29*  CREATININE 1.28* 0.90 1.40* 1.60* 1.48* 1.15*  CALCIUM 8.6*  --  7.7* 7.9* 8.0* 8.3*   GFR: Estimated Creatinine Clearance: 38.1 mL/min (A) (by C-G formula based on SCr of 1.15 mg/dL (H)). Liver Function Tests: No results for input(s): AST, ALT, ALKPHOS, BILITOT, PROT, ALBUMIN in the last 168 hours. No results for input(s): LIPASE, AMYLASE in the last 168 hours. No results for input(s): AMMONIA in the last 168 hours. Coagulation Profile:  No results for input(s): INR, PROTIME in the last 168 hours.  Cardiac Enzymes: No results for input(s): CKTOTAL, CKMB, CKMBINDEX, TROPONINI in the last 168 hours. BNP (last 3 results) No results for input(s): PROBNP in the last 8760 hours. HbA1C: No results for input(s): HGBA1C in the last 72 hours. CBG: Recent Labs  Lab 05/07/21 1200 05/07/21 2109 05/08/21 0622 05/08/21 1141 05/08/21 1608  GLUCAP 180* 212* 179* 209* 169*   Lipid Profile: No results for input(s): CHOL, HDL, LDLCALC, TRIG, CHOLHDL, LDLDIRECT in the last 72 hours. Thyroid Function Tests: No results for input(s): TSH, T4TOTAL, FREET4, T3FREE, THYROIDAB in the last 72 hours. Anemia Panel: No  results for input(s): VITAMINB12, FOLATE, FERRITIN, TIBC, IRON, RETICCTPCT in the last 72 hours.  Sepsis Labs: No results for input(s): PROCALCITON, LATICACIDVEN in the last 168 hours.   Recent Results (from the past 240 hour(s))  Fungus Culture With Stain     Status: Abnormal (Preliminary result)   Collection Time: 05/03/21  5:42 PM   Specimen: PATH Other; Tissue  Result Value Ref Range Status   Fungus Stain Final report (A)  Final    Comment: (NOTE) Performed At: [facility] [place], [place] [id number] Rush Farmer MD [id number]    Fungus (Mycology) Culture PENDING  Incomplete   Fungal Source WOUND  Final    Comment: LEFT THIRD TOE SWABS FOR CULTURE SPEC A Performed at [facility], [place] 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; CULTURE IN PROGRESS FOR 5 DAYS    Report Status PENDING  Incomplete   Organism ID, Bacteria STAPHYLOCOCCUS AUREUS  Final   Organism ID, Bacteria STREPTOCOCCUS ANGINOSIS  Final      Susceptibility   Staphylococcus aureus - MIC*    CIPROFLOXACIN <=0.5 SENSITIVE Sensitive     ERYTHROMYCIN >=8 RESISTANT Resistant     GENTAMICIN <=0.5 SENSITIVE Sensitive     OXACILLIN 0.5 SENSITIVE Sensitive     TETRACYCLINE <=1 SENSITIVE Sensitive     VANCOMYCIN 1 SENSITIVE Sensitive     TRIMETH/SULFA <=10 SENSITIVE Sensitive     CLINDAMYCIN RESISTANT Resistant      RIFAMPIN <=0.5 SENSITIVE Sensitive     Inducible Clindamycin POSITIVE Resistant     * FEW STAPHYLOCOCCUS AUREUS   Streptococcus anginosis - MIC*    PENICILLIN <=0.06 SENSITIVE Sensitive     CEFTRIAXONE 0.25 SENSITIVE Sensitive     ERYTHROMYCIN <=0.12 SENSITIVE Sensitive     LEVOFLOXACIN 0.5 SENSITIVE Sensitive     VANCOMYCIN 0.5 SENSITIVE Sensitive     * FEW STREPTOCOCCUS ANGINOSIS  Fungus Culture Result     Status: Abnormal   Collection Time: 05/03/21  5:42 PM  Result Value Ref Range Status   Result 1 Comment (A)  Final    Comment: (NOTE) Fungal elements, such as arthroconidia, hyphal fragments, chlamydoconidia, observed. Performed At: [facility] [address]  [place], [place] [id number] Rush Farmer MD TL:[id number]       Imaging Studies   No results found.   Medications   Scheduled Meds:  ascorbic acid  250 mg Oral Q supper   aspirin EC  81 mg Oral QHS   atorvastatin  80 mg Oral Daily   brimonidine  1 drop Right Eye BID   Chlorhexidine Gluconate Cloth  6 each Topical Daily   docusate sodium  100 mg Oral Daily   insulin aspart  0-15 Units Subcutaneous TID WC   metoprolol tartrate  37.5 mg Oral BID   pantoprazole  40 mg Oral Once per day on Mon Wed Fri   polyethylene glycol  17 g Oral Daily   pyridoxine  100 mg Oral QPM   saccharomyces boulardii  250 mg Oral BID   sodium chloride flush  3 mL Intravenous Q12H   sodium hypochlorite   Irrigation BID   Continuous Infusions:  sodium chloride     cefTRIAXone (ROCEPHIN)  IV 2 g (05/08/21 0904)   heparin 1,750 Units/hr (05/08/21 0920)   magnesium sulfate bolus IVPB         LOS: 11 days    Time spent: 25 minutes with > 50% spent at bedside and in coordination of care     Ezekiel Slocumb, DO [facility]  05/08/2021, 5:54 PM      If 7PM-7AM, please contact night-coverage. How to contact the [facility] Attending or Consulting provider [place] or covering provider during after hours [place],  for this patient?    Check the care team in [facility] and look for a) attending/consulting [facility] provider listed and b) the [facility] team listed Log into [email or web address] and use [place]'s universal password to access. If you do not have the password, please contact the hospital operator. Locate the [facility] provider you are looking for under [facility] and page to a number that you can be directly reached. If you still have difficulty reaching the provider, please page the [facility] (Director on Call) for the Hospitalists listed on amion for assistance.

## 2021-05-09 DIAGNOSIS — M86172 Other acute osteomyelitis, left ankle and foot: Secondary | ICD-10-CM | POA: Diagnosis not present

## 2021-05-09 LAB — BASIC METABOLIC PANEL
Anion gap: 7 (ref 5–15)
BUN: 16 mg/dL (ref 8–23)
CO2: 21 mmol/L — ABNORMAL LOW (ref 22–32)
Calcium: 8.3 mg/dL — ABNORMAL LOW (ref 8.9–10.3)
Chloride: 110 mmol/L (ref 98–111)
Creatinine, Ser: 1.01 mg/dL — ABNORMAL HIGH (ref 0.44–1.00)
GFR, Estimated: 57 mL/min — ABNORMAL LOW (ref >60)
Glucose, Bld: 196 mg/dL — ABNORMAL HIGH (ref 70–99)
Potassium: 3.8 mmol/L (ref 3.5–5.1)
Sodium: 138 mmol/L (ref 135–145)

## 2021-05-09 LAB — GLUCOSE, CAPILLARY
Glucose-Capillary: 176 mg/dL — ABNORMAL HIGH (ref 70–99)
Glucose-Capillary: 201 mg/dL — ABNORMAL HIGH (ref 70–99)
Glucose-Capillary: 197 mg/dL — ABNORMAL HIGH (ref 70–99)
Glucose-Capillary: 187 mg/dL — ABNORMAL HIGH (ref 70–99)

## 2021-05-09 LAB — HEPARIN LEVEL (UNFRACTIONATED): Heparin Unfractionated: 0.28 IU/mL — ABNORMAL LOW (ref 0.30–0.70)

## 2021-05-09 LAB — CBC
HCT: 24.2 % — ABNORMAL LOW (ref 36.0–46.0)
Hemoglobin: 7.4 g/dL — ABNORMAL LOW (ref 12.0–15.0)
MCH: 28 pg (ref 26.0–34.0)
MCHC: 30.6 g/dL (ref 30.0–36.0)
MCV: 91.7 fL (ref 80.0–100.0)
Platelets: 482 10*3/uL — ABNORMAL HIGH (ref 150–400)
RBC: 2.64 MIL/uL — ABNORMAL LOW (ref 3.87–5.11)
RDW: 15.1 % (ref 11.5–15.5)
WBC: 18.2 10*3/uL — ABNORMAL HIGH (ref 4.0–10.5)
nRBC: 0.3 % — ABNORMAL HIGH (ref 0.0–0.2)

## 2021-05-09 LAB — AEROBIC/ANAEROBIC CULTURE W GRAM STAIN (SURGICAL/DEEP WOUND)

## 2021-05-09 MED ORDER — INSULIN DETEMIR 100 UNIT/ML ~~LOC~~ SOLN
6.0000 [IU] | Freq: Every day | SUBCUTANEOUS | Status: DC
  Administered 2021-05-09 – 2021-05-14 (×7): 6 [IU] via SUBCUTANEOUS
  Filled 2021-05-09 (×7): qty 0.06

## 2021-05-09 NOTE — Progress Notes (Addendum)
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 Stanford Breed VSS. LLE well perfused. Amp site not yet granualting. Fibrinous exudate of majority of wound. Some drainage from plantar aspect of wound. Continue Dakins damp to dry dressing changes BID Continue high-intensity statin and aspirin. On Plavix PTA.   DVT prophy/on heparin infusion to promote graft patency. Platelet count elevated to 482K.  Hgb is 7.4  today. Last tansfused additional unit PCs on Saturday. Continue to monitor.   Leukocytosis. Afebrile. On vancomycin. Cultures from left toe swabs grew staph and strep...both sensative to vanc. ? Keflex when discharged.   Dispo: SNF    SUBJECTIVE:   No complaints  PHYSICAL EXAM:   Vitals:   05/08/21 1644 05/08/21 2003 05/08/21 2355 05/09/21 0410  BP: (!) 144/52 (!) 160/53 (!) 130/50 (!) 151/70  Pulse:    74  Resp: 17 14 12 13   Temp: 98.6 F (37 C)  98.5 F (36.9 C) 98.7 F (37.1 C)  TempSrc: Oral  Oral Oral  SpO2: 100% 98% 99% 99%  Weight:      Height:       General appearance: Awake, alert in no apparent distress Cardiac: Heart rate and rhythm are regular Respirations: Nonlabored Incisions: Right groin and lower leg incisions are all well approximated without bleeding or hematoma Extremities: Both feet are warm with intact sensation and motor function.  Left 3rd toe amp site with fibrinous exudate; not yet granulating. I was able to express some drainage today from plantar aspect of wound.   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)   Acute kidney injury superimposed on chronic kidney disease (HCC)   Hyponatremia   Anemia, chronic disease   Cellulitis of left lower extremity   CURRENT MEDS:    ascorbic acid  250 mg Oral Q supper   aspirin EC  81 mg Oral QHS   atorvastatin  80 mg Oral Daily   brimonidine  1 drop Right Eye BID   Chlorhexidine Gluconate Cloth  6 each Topical Daily   docusate sodium  100 mg Oral Daily   insulin aspart  0-15 Units Subcutaneous TID WC   metoprolol tartrate  37.5 mg Oral BID   pantoprazole  40 mg Oral Once per day on Mon Wed Fri   polyethylene glycol  17 g Oral Daily   pyridoxine  100 mg Oral QPM   saccharomyces boulardii  250 mg Oral BID   sodium chloride flush  3 mL Intravenous Q12H   sodium hypochlorite   Irrigation BID    Barbie Banner, PA-C  Office: [phone]  05/09/2021   VASCULAR STAFF ADDENDUM: I have independently interviewed and examined the patient. I agree with the above.  I think she has a high risk of needing further foot amputation. Obviously we would like to try to avoid this.  She is otherwise ready for discharge from my standpoint. Recommend home health nurse to assist with daily dressing changes to the foot with Dakin's. Can transition to Keflex by mouth at any point. Will see her weekly in the office until wound is healed or wound declares itself as needing further amputation.  [name]. Stanford Breed, MD [facility] Phone Number: [phone] 05/09/2021 12:09 PM

## 2021-05-09 NOTE — Progress Notes (Signed)
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 --Continue ASA and statin --Plavix on hold --On heparin gtt for graft patency --plan for d/c on Xarelto 2.5 mg BID  Sepsis due to osteomyelitis, cellulitis -sepsis POA.  Sepsis physiology has improved.  Angiogram on 8/5 showing severe left superficial femoral artery occlusive disease with 3 cm occlusion of the left popliteal artery, 80% stenosis of the distal right external iliac artery above existing femoropopliteal bypass. 8/10 - left common femoral - tibioperoneal trunk bypass with 21mm ringed ePTFE in anatomic tunnel left third toe open amputation --Vascular surgery and podiatry are consulted --Continue IV vancomycin  -- On heparin drip for graft patency --Stop Zosyn given cultures with GPC's (8/11) --Blood cultures negative  Vascular Recs: OOB/mobilize Wet-to-dry dressing changes BID  May transition to [facility] once infection cleared. Cultures pending. Continue antibiotics x 5 days. PT / OT / OOB / Ambulate with heel weightbearing  Acute kidney injury -due to prerenal azotemia, had improved but creatinine now rising some again.  Suspect due to poor p.o. intake --Monitor BMP --Encourage p.o. intake and will run some fluids if needed tomorrow if still worsening  Hyponatremia -mild, sodium 131 on morning labs.  Suspect poor p.o. intake.  We will hold off  on IV fluids for now and encourage p.o. intake.  IV fluids tomorrow if not improving.  COVID-19 infection -incidentally positive and asymptomatic. --Monitor clinically --Airborne isolation x10 days  Essential hypertension -continue metoprolol.  HCTZ and myocarditis are on hold.  Type 2 diabetes -uncontrolled with A1c 7.7 in April. --Hold oral meds --Sliding scale NovoLog for now  Chronic iron deficiency anemia -iron panel consistent with severe iron deficiency. -- Consider IV iron infusion postoperatively  CAD status post CABG in 03/2020 -continue aspirin and statin.  Plavix on hold.   Patient BMI: Body mass index is 25.52 kg/m.   DVT prophylaxis: SCD's Start: 05/03/21 2005  Lovenox held for surgery   Diet:  Diet Orders (From admission, onward)     Start     Ordered   05/05/21 2219  Diet Carb Modified Fluid consistency: Thin; Room service appropriate? Yes  Diet effective now       Question Answer Comment  Diet-HS Snack? Nothing   Calorie Level Medium 1600-2000   Fluid consistency: Thin   Room service appropriate? Yes      05/05/21 2218              Code Status: Full Code   Brief Narrative / Hospital Course to Date:   "[name] is a 80 y.o. female with medical history significant of hypertension, hyperlipidemia, CAD s/p CABG in 2021, PVD s/p bypass grafting, diabetes mellitus type II, ray amputation of right foot presented with left third toe wound.  developed redness pain and streaking going up her left leg associated with fevers and chills . She is s/p  right common femoral endarterectomy with femoral popliteal bypass graft on 08/16/2020.  And previously had angioplasty with stent continue to have the left SFA. In the ED she was febrile, mildly tachycardic, WBC was 20.8K, x-ray of the left foot noted osteomyelitis of distal phalanx third toe. -Was also incidentally positive for COVID, is asymptomatic -Vascular following, underwent angiogram on 8/5, noted to have  severe left superficial femoral artery occlusive disease, plan for left fem-pop bypass tomorrow. Improving on antibiotics, still with intermittent fevers"  Subjective 05/09/21    Patient up in recliner when seen today.  She reports not needing any pain medications.  Only has significant pain with moving around but it resolves on its own.  She mentions that her daughter works and she may need to go to a SNF instead of home initially.   Disposition Plan & Communication   Status is: Inpatient  Remains inpatient appropriate because: Further procedures are planned as outlined above  Dispo: The patient is from: Home              Anticipated d/c is to: Home              Patient currently is not medically stable to d/c.   Difficult to place patient No    Consults, Procedures, Significant Events   Consultants:  Vascular surgery Podiatry  Procedures:  8/5 - Ultrasound right groin 8/5 - Abdominal aortogram with bilateral lower extremity runoff 8/10 - left common femoral - tibioperoneal trunk bypass with 54mm ringed ePTFE in anatomic tunnel 8/10 - left third toe open amputation  Antimicrobials:  Anti-infectives (From admission, onward)    Start     Dose/Rate Route Frequency Ordered Stop   05/07/21 0845  cefTRIAXone (ROCEPHIN) 2 g in sodium chloride 0.9 % 100 mL IVPB        2 g 200 mL/hr over 30 Minutes Intravenous Every 24 hours 05/07/21 0755     05/06/21 0930  vancomycin (VANCOREADY) IVPB 750 mg/150 mL  Status:  Discontinued        750 mg 150 mL/hr over 60 Minutes Intravenous Every 24 hours 05/06/21 0841 05/07/21 0758   05/06/21 0900  vancomycin (VANCOREADY) IVPB 1250 mg/250 mL  Status:  Discontinued        1,250 mg 166.7 mL/hr over 90 Minutes Intravenous Every 48 hours 05/05/21 1506 05/06/21 0841   04/28/21 1800  vancomycin (VANCOREADY) IVPB 750 mg/150 mL  Status:  Discontinued        750 mg 150 mL/hr over 60 Minutes Intravenous Every 24 hours 04/28/21 1324 05/05/21 1506    04/28/21 0030  piperacillin-tazobactam (ZOSYN) IVPB 3.375 g  Status:  Discontinued        3.375 g 12.5 mL/hr over 240 Minutes Intravenous Every 8 hours 04/27/21 1650 05/04/21 1358   04/27/21 1603  vancomycin variable dose per unstable renal function (pharmacist dosing)  Status:  Discontinued         Does not apply See admin instructions 04/27/21 1604 04/28/21 1324   04/27/21 1545  piperacillin-tazobactam (ZOSYN) IVPB 3.375 g        3.375 g 100 mL/hr over 30 Minutes Intravenous  Once 04/27/21 1532 04/27/21 1707   04/27/21 1545  vancomycin (VANCOREADY) IVPB 1250 mg/250 mL        1,250 mg 166.7 mL/hr over 90 Minutes Intravenous  Once 04/27/21 1532 04/28/21 0305         Micro    Objective   Vitals:   05/09/21 0740 05/09/21 0741 05/09/21 1112  05/09/21 1610  BP: (!) 155/76  (!) 161/58 (!) 152/70  Pulse: (!) 101  88 98  Resp: 14  19 20   Temp: 98.1 F (36.7 C)  98 F (36.7 C) 97.8 F (36.6 C)  TempSrc: Oral  Oral Oral  SpO2: 97% 97% 98% 100%  Weight:      Height:        Intake/Output Summary (Last 24 hours) at 05/09/2021 1822 Last data filed at 05/09/2021 0431 Gross per 24 hour  Intake 209 ml  Output 900 ml  Net -691 ml   Filed Weights   04/27/21 1500  Weight: 68.5 kg    Physical Exam:  General exam: in recliner, awake, alert, no mild distress due to pain Respiratory system: CTAB, normal respiratory effort, on room air. Cardiovascular system: RRR, no pedal edema.   Central nervous system: A&O x4. no gross focal neurologic deficits, normal speech Skin: dry, intact, normal temp and color Psychiatry: normal mood, congruent affect, judgement and insight appear normal  Labs   Data Reviewed: I have personally reviewed following labs and imaging studies  CBC: Recent Labs  Lab 05/05/21 0247 05/05/21 2252 05/07/21 0039 05/08/21 0133 05/09/21 0117  WBC 21.9* 20.8* 18.7* 18.6* 18.2*  HGB 8.5* 8.5* 7.7* 7.7* 7.4*  HCT 26.2* 25.9* 24.1* 24.4* 24.2*  MCV 88.5 88.7 89.9  90.0 91.7  PLT 350 363 406* 437* 099*   Basic Metabolic Panel: Recent Labs  Lab 05/04/21 0443 05/05/21 0247 05/05/21 2252 05/07/21 0039 05/09/21 0117  NA 136 131* 133* 139 138  K 4.4 3.7 3.6 4.1 3.8  CL 109 104 106 111 110  CO2 19* 18* 17* 21* 21*  GLUCOSE 222* 281* 202* 222* 196*  BUN 25* 37* 37* 29* 16  CREATININE 1.40* 1.60* 1.48* 1.15* 1.01*  CALCIUM 7.7* 7.9* 8.0* 8.3* 8.3*   GFR: Estimated Creatinine Clearance: 43.4 mL/min (A) (by C-G formula based on SCr of 1.01 mg/dL (H)). Liver Function Tests: No results for input(s): AST, ALT, ALKPHOS, BILITOT, PROT, ALBUMIN in the last 168 hours. No results for input(s): LIPASE, AMYLASE in the last 168 hours. No results for input(s): AMMONIA in the last 168 hours. Coagulation Profile: No results for input(s): INR, PROTIME in the last 168 hours.  Cardiac Enzymes: No results for input(s): CKTOTAL, CKMB, CKMBINDEX, TROPONINI in the last 168 hours. BNP (last 3 results) No results for input(s): PROBNP in the last 8760 hours. HbA1C: No results for input(s): HGBA1C in the last 72 hours. CBG: Recent Labs  Lab 05/08/21 1608 05/08/21 2122 05/09/21 0623 05/09/21 1112 05/09/21 1609  GLUCAP 169* 193* 176* 201* 197*   Lipid Profile: No results for input(s): CHOL, HDL, LDLCALC, TRIG, CHOLHDL, LDLDIRECT in the last 72 hours. Thyroid Function Tests: No results for input(s): TSH, T4TOTAL, FREET4, T3FREE, THYROIDAB in the last 72 hours. Anemia Panel: No results for input(s): VITAMINB12, FOLATE, FERRITIN, TIBC, IRON, RETICCTPCT in the last 72 hours.  Sepsis Labs: No results for input(s): PROCALCITON, LATICACIDVEN in the last 168 hours.   Recent Results (from the past 240 hour(s))  Fungus Culture With Stain     Status: Abnormal (Preliminary result)   Collection Time: 05/03/21  5:42 PM   Specimen: PATH Other; Tissue  Result Value Ref Range Status   Fungus Stain Final report (A)  Final    Comment: (NOTE) Performed At: [facility] [place], [place] [id number] Rush Farmer MD [id number]    Fungus (Mycology) Culture PENDING  Incomplete   Fungal Source  WOUND  Final    Comment: LEFT THIRD TOE SWABS FOR CULTURE SPEC A Performed at [facility], [place] [address] [place], [place] [address]   Aerobic/Anaerobic Culture w Gram Stain (surgical/deep wound)     Status: None   Collection Time: 05/03/21  5:42 PM   Specimen: PATH Other; Tissue  Result Value Ref Range Status   Specimen Description WOUND  Final   Special Requests LEFT THIRD TOE SWABS FOR CULTURE SPEC A  Final   Gram Stain   Final    RARE WBC PRESENT,BOTH PMN AND MONONUCLEAR RARE GRAM POSITIVE COCCI    Culture   Final    FEW STAPHYLOCOCCUS AUREUS FEW STREPTOCOCCUS ANGINOSIS NO ANAEROBES ISOLATED Performed at [facility], [place] [address], [place], [place] [address]    Report Status 05/09/2021 FINAL  Final   Organism ID, Bacteria STAPHYLOCOCCUS AUREUS  Final   Organism ID, Bacteria STREPTOCOCCUS ANGINOSIS  Final      Susceptibility   Staphylococcus aureus - MIC*    CIPROFLOXACIN <=0.5 SENSITIVE Sensitive     ERYTHROMYCIN >=8 RESISTANT Resistant     GENTAMICIN <=0.5 SENSITIVE Sensitive     OXACILLIN 0.5 SENSITIVE Sensitive     TETRACYCLINE <=1 SENSITIVE Sensitive     VANCOMYCIN 1 SENSITIVE Sensitive     TRIMETH/SULFA <=10 SENSITIVE Sensitive     CLINDAMYCIN RESISTANT Resistant     RIFAMPIN <=0.5 SENSITIVE Sensitive     Inducible Clindamycin POSITIVE Resistant     * FEW STAPHYLOCOCCUS AUREUS   Streptococcus anginosis - MIC*    PENICILLIN <=0.06 SENSITIVE Sensitive     CEFTRIAXONE 0.25 SENSITIVE Sensitive     ERYTHROMYCIN <=0.12 SENSITIVE Sensitive     LEVOFLOXACIN 0.5 SENSITIVE Sensitive     VANCOMYCIN 0.5 SENSITIVE Sensitive     * FEW STREPTOCOCCUS ANGINOSIS  Fungus Culture Result     Status: Abnormal   Collection Time: 05/03/21  5:42 PM  Result Value Ref Range Status   Result 1 Comment (A)  Final     Comment: (NOTE) Fungal elements, such as arthroconidia, hyphal fragments, chlamydoconidia, observed. Performed At: [facility] [place], [place] [id number] Rush Farmer MD [id number]       Imaging Studies   No results found.   Medications   Scheduled Meds:  ascorbic acid  250 mg Oral Q supper   aspirin EC  81 mg Oral QHS   atorvastatin  80 mg Oral Daily   brimonidine  1 drop Right Eye BID   Chlorhexidine Gluconate Cloth  6 each Topical Daily   docusate sodium  100 mg Oral Daily   insulin aspart  0-15 Units Subcutaneous TID WC   insulin detemir  6 Units Subcutaneous QHS   metoprolol tartrate  37.5 mg Oral BID   pantoprazole  40 mg Oral Once per day on Mon Wed Fri   polyethylene glycol  17 g Oral Daily   pyridoxine  100 mg Oral QPM   saccharomyces boulardii  250 mg Oral BID   sodium chloride flush  3 mL Intravenous Q12H   sodium hypochlorite   Irrigation BID   Continuous Infusions:  sodium chloride     cefTRIAXone (ROCEPHIN)  IV 2 g (05/09/21 0906)   heparin 1,800 Units/hr (05/09/21 1346)   magnesium sulfate bolus IVPB         LOS: 12 days    Time spent: 25 minutes with > 50% spent at bedside and in coordination of care  Ezekiel Sl[place]umb, DO [facility]  05/09/2021, 6:22 PM      If 7PM-7AM, please contact night-coverage. How to contact the [facility] Attending or Consulting provider [place] or covering provider during after hours [place], for this patient?    Check the care team in [facility] and look for a) attending/consulting TR[facility] provider listed and b) the [facility] team listed Log into [email or web address] and use [employer]'s universal password to access. If you do not have the password, please contact the hospital operator. Locate the [facility] provider you are looking for under [facility] and page to a number that you can be directly reached. If you still have difficulty reaching the provider, please page the [facility] (Director on Call) for  the Hospitalists listed on amion for assistance.  PROGRESS NOTE    [name]   WSF:[id number]  DOB: [birth year]  PCP: [name], MD    DOA: 04/27/2021 LOS: 12   Assessment & Plan   Principal Problem:   Osteomyelitis of foot, left, acute ([place]) Active Problems:   Essential hypertension   PAD (peripheral artery disease) ([place])   Cellulitis in diabetic foot ([place])   Acute kidney injury superimposed on chronic kidney disease ([facility])   Hyponatremia   Anemia, chronic disease   Cellulitis of left lower extremity   Sepsis due to osteomyelitis, cellulitis -sepsis POA.  Sepsis physiology has improved. Status post angiogram on 8/5 showing severe left superficial femoral artery occlusive disease with 3 cm occlusion of the left popliteal artery, 80% stenosis of the distal right external iliac artery above existing femoropopliteal bypass --Transitioned IV Vancomycin >> Rocephin --initially on Zosyn also, d/c'd 8/11 --Blood cultures negative --Vascular surgery and podiatry are consulted Vascular Recs: OOB/mobilize Wet-to-dry dressing changes BID  May transition to [facility] once infection cleared. Cultures pending. Continue antibiotics x 5 days. PT / OT / OOB / Ambulate with heel weightbearing --Hold Plavix until okay from surgical standpoint  Left great toe gangrene Peripheral arterial disease -- On heparin drip for graft patency --Plan to discharge on Xarelto 2.5 mg BID --Continue aspirin and statin --Hold Plavix  Acute kidney injury - due to prerenal azotemia, due to poor p.o. intake.  Also having urinary retention.   Cr 8/13 improved somewhat after Foley placed 8/12. --Monitor BMP --had Foley for a couple days & d/c'd 8/14, pt voiding --Encourage p.o. intake  Acute urinary retention -Foley placed on 05/05/2021 after patient requiring and out cath and still having retention on bladder scan. -- Voiding okay after Foley d/c'd --Continue bladder scans to monitor -- Trial of  Flomax or other medication to help if still retaining  Hyponatremia -Resolved. Suspect due to poor p.o. intake.   COVID-19 infection -incidentally positive and asymptomatic. --Monitor clinically --Airborne isolation x10 days  Essential hypertension -continue metoprolol.  HCTZ and myocarditis are on hold.  Type 2 diabetes -uncontrolled with A1c 7.7 in April. Hyperglycemia - pt concern for wound healing with sugars high --Hold oral meds --Sliding scale NovoLog for now --Add Levemir 6 units qHS  Chronic iron deficiency anemia -iron panel consistent with severe iron deficiency. -- Consider IV iron infusion postoperatively  CAD status post CABG in 03/2020 -continue aspirin and statin.  Plavix on hold.  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  Continue aspirin and statin.  Plavix on hold.   Patient BMI: Body mass index is 25.52 kg/m.   DVT prophylaxis: SCD's Start: 05/03/21 2005  Lovenox held for surgery  Diet:  Diet Orders (From admission, onward)     Start     Ordered   05/05/21 2219  Diet Carb Modified Fluid consistency: Thin; Room service appropriate? Yes  Diet effective now       Question Answer Comment  Diet-HS Snack? Nothing   Calorie Level Medium 1600-2000   Fluid consistency: Thin   Room service appropriate? Yes      05/05/21 2218              Code Status: Full Code   Brief Narrative / Hospital Course to Date:   "[name] is a 80 y.o. female with medical history significant of hypertension, hyperlipidemia, CAD s/p CABG in 2021, PVD s/p bypass grafting, diabetes mellitus type II, ray amputation of right foot presented with left third toe wound.  developed redness pain and streaking going up her left leg associated with fevers and chills . She is s/p right common femoral endarterectomy with femoral popliteal bypass graft on 08/16/2020.  And previously had angioplasty with stent continue to have the left  SFA. In the ED she was febrile, mildly tachycardic, WBC was 20.8K, x-ray of the left foot noted osteomyelitis of distal phalanx third toe. -Was also incidentally positive for COVID, is asymptomatic -Vascular following, underwent angiogram on 8/5, noted to have severe left superficial femoral artery occlusive disease, plan for left fem-pop bypass tomorrow. Improving on antibiotics, still with intermittent fevers"  Subjective 05/09/21    Patient states feeling well.  Didn't sleep well, was napping when I entered to see her.  She discussed plan now for home health with frequent close vascular follow up visits in clinic to ensure wound healing.  No fever chills pain or other complaints.    Disposition Plan & Communication   Status is: Inpatient  Remains inpatient appropriate because: Further procedures are planned as outlined above  Dispo: The patient is from: Home              Anticipated d/c is to: Home              Patient currently is not medically stable to d/c.   Difficult to place patient No    Consults, Procedures, Significant Events   Consultants:  Vascular surgery Podiatry  Procedures:  8/5 - Ultrasound right groin 8/5 - Abdominal aortogram with bilateral lower extremity runoff 8/10 - left common femoral - tibioperoneal trunk bypass with 73mm ringed ePTFE in anatomic tunnel 8/10 - left third toe open amputation  Antimicrobials:  Anti-infectives (From admission, onward)    Start     Dose/Rate Route Frequency Ordered Stop   05/07/21 0845  cefTRIAXone (ROCEPHIN) 2 g in sodium chloride 0.9 % 100 mL IVPB        2 g 200 mL/hr over 30 Minutes Intravenous Every 24 hours 05/07/21 0755     05/06/21 0930  vancomycin (VANCOREADY) IVPB 750 mg/150 mL  Status:  Discontinued        750 mg 150 mL/hr over 60 Minutes Intravenous Every 24 hours 05/06/21 0841 05/07/21 0758   05/06/21 0900  vancomycin (VANCOREADY) IVPB 1250 mg/250 mL  Status:  Discontinued        1,250 mg 166.7 mL/hr  over 90 Minutes Intravenous Every 48 hours 05/05/21 1506 05/06/21 0841   04/28/21 1800  vancomycin (VANCOREADY) IVPB 750 mg/150 mL  Status:  Discontinued        750 mg 150 mL/hr over 60 Minutes Intravenous Every 24 hours 04/28/21 1324 05/05/21 1506  04/28/21 0030  piperacillin-tazobactam (ZOSYN) IVPB 3.375 g  Status:  Discontinued        3.375 g 12.5 mL/hr over 240 Minutes Intravenous Every 8 hours 04/27/21 1650 05/04/21 1358   04/27/21 1603  vancomycin variable dose per unstable renal function (pharmacist dosing)  Status:  Discontinued         Does not apply See admin instructions 04/27/21 1604 04/28/21 1324   04/27/21 1545  piperacillin-tazobactam (ZOSYN) IVPB 3.375 g        3.375 g 100 mL/hr over 30 Minutes Intravenous  Once 04/27/21 1532 04/27/21 1707   04/27/21 1545  vancomycin (VANCOREADY) IVPB 1250 mg/250 mL        1,250 mg 166.7 mL/hr over 90 Minutes Intravenous  Once 04/27/21 1532 04/28/21 0305         Micro    Objective   Vitals:   05/09/21 0740 05/09/21 0741 05/09/21 1112 05/09/21 1610  BP: (!) 155/76  (!) 161/58 (!) 152/70  Pulse: (!) 101  88 98  Resp: 14  19 20   Temp: 98.1 F (36.7 C)  98 F (36.7 C) 97.8 F (36.6 C)  TempSrc: Oral  Oral Oral  SpO2: 97% 97% 98% 100%  Weight:      Height:        Intake/Output Summary (Last 24 hours) at 05/09/2021 1822 Last data filed at 05/09/2021 0431 Gross per 24 hour  Intake 209 ml  Output 900 ml  Net -691 ml   Filed Weights   04/27/21 1500  Weight: 68.5 kg    Physical Exam:  General exam: awake laying in bed, awake, alert, no acute distress  Respiratory system: normal respiratory effort, on room air Cardiovascular system: RRR, no pedal edema.   Central nervous system: A&O x4. no gross focal neurologic deficits, normal speech Extremities: no edema, left medial lower leg incision is healing well and without surrounding erythema or swelling, clean dry and intact bandage on the left foot  Labs   Data Reviewed: I  have personally reviewed following labs and imaging studies  CBC: Recent Labs  Lab 05/05/21 0247 05/05/21 2252 05/07/21 0039 05/08/21 0133 05/09/21 0117  WBC 21.9* 20.8* 18.7* 18.6* 18.2*  HGB 8.5* 8.5* 7.7* 7.7* 7.4*  HCT 26.2* 25.9* 24.1* 24.4* 24.2*  MCV 88.5 88.7 89.9 90.0 91.7  PLT 350 363 406* 437* 948*   Basic Metabolic Panel: Recent Labs  Lab 05/04/21 0443 05/05/21 0247 05/05/21 2252 05/07/21 0039 05/09/21 0117  NA 136 131* 133* 139 138  K 4.4 3.7 3.6 4.1 3.8  CL 109 104 106 111 110  CO2 19* 18* 17* 21* 21*  GLUCOSE 222* 281* 202* 222* 196*  BUN 25* 37* 37* 29* 16  CREATININE 1.40* 1.60* 1.48* 1.15* 1.01*  CALCIUM 7.7* 7.9* 8.0* 8.3* 8.3*   GFR: Estimated Creatinine Clearance: 43.4 mL/min (A) (by C-G formula based on SCr of 1.01 mg/dL (H)). Liver Function Tests: No results for input(s): AST, ALT, ALKPHOS, BILITOT, PROT, ALBUMIN in the last 168 hours. No results for input(s): LIPASE, AMYLASE in the last 168 hours. No results for input(s): AMMONIA in the last 168 hours. Coagulation Profile: No results for input(s): INR, PROTIME in the last 168 hours.  Cardiac Enzymes: No results for input(s): CKTOTAL, CKMB, CKMBINDEX, TROPONINI in the last 168 hours. BNP (last 3 results) No results for input(s): PROBNP in the last 8760 hours. HbA1C: No results for input(s): HGBA1C in the last 72 hours. CBG: Recent Labs  Lab 05/08/21 1608 05/08/21 2122  05/09/21 0623 05/09/21 1112 05/09/21 1609  GLUCAP 169* 193* 176* 201* 197*   Lipid Profile: No results for input(s): CHOL, HDL, LDLCALC, TRIG, CHOLHDL, LDLDIRECT in the last 72 hours. Thyroid Function Tests: No results for input(s): TSH, T4TOTAL, FREET4, T3FREE, THYROIDAB in the last 72 hours. Anemia Panel: No results for input(s): VITAMINB12, FOLATE, FERRITIN, TIBC, IRON, RETICCTPCT in the last 72 hours.  Sepsis Labs: No results for input(s): PROCALCITON, LATICACIDVEN in the last 168 hours.   Recent Results  (from the past 240 hour(s))  Fungus Culture With Stain     Status: Abnormal (Preliminary result)   Collection Time: 05/03/21  5:42 PM   Specimen: PATH Other; Tissue  Result Value Ref Range Status   Fungus Stain Final report (A)  Final    Comment: (NOTE) Performed At: [facility] [place], [place] [id number] Rush Farmer MD [id number]    Fungus (Mycology) Culture PENDING  Incomplete   Fungal Source WOUND  Final    Comment: LEFT THIRD TOE SWABS FOR CULTURE SPEC A Performed at [facility], [place] [address] [place], [place] [address]   Aerobic/Anaerobic Culture w Gram Stain (surgical/deep wound)     Status: None   Collection Time: 05/03/21  5:42 PM   Specimen: PATH Other; Tissue  Result Value Ref Range Status   Specimen Description WOUND  Final   Special Requests LEFT THIRD TOE SWABS FOR CULTURE SPEC A  Final   Gram Stain   Final    RARE WBC PRESENT,BOTH PMN AND MONONUCLEAR RARE GRAM POSITIVE COCCI    Culture   Final    FEW STAPHYLOCOCCUS AUREUS FEW STREPTOCOCCUS ANGINOSIS NO ANAEROBES ISOLATED Performed at [facility], [place] [address], [place], [place] [address]    Report Status 05/09/2021 FINAL  Final   Organism ID, Bacteria STAPHYLOCOCCUS AUREUS  Final   Organism ID, Bacteria STREPTOCOCCUS ANGINOSIS  Final      Susceptibility   Staphylococcus aureus - MIC*    CIPROFLOXACIN <=0.5 SENSITIVE Sensitive     ERYTHROMYCIN >=8 RESISTANT Resistant     GENTAMICIN <=0.5 SENSITIVE Sensitive     OXACILLIN 0.5 SENSITIVE Sensitive     TETRACYCLINE <=1 SENSITIVE Sensitive     VANCOMYCIN 1 SENSITIVE Sensitive     TRIMETH/SULFA <=10 SENSITIVE Sensitive     CLINDAMYCIN RESISTANT Resistant     RIFAMPIN <=0.5 SENSITIVE Sensitive     Inducible Clindamycin POSITIVE Resistant     * FEW STAPHYLOCOCCUS AUREUS   Streptococcus anginosis - MIC*    PENICILLIN <=0.06 SENSITIVE Sensitive     CEFTRIAXONE 0.25 SENSITIVE Sensitive     ERYTHROMYCIN <=0.12  SENSITIVE Sensitive     LEVOFLOXACIN 0.5 SENSITIVE Sensitive     VANCOMYCIN 0.5 SENSITIVE Sensitive     * FEW STREPTOCOCCUS ANGINOSIS  Fungus Culture Result     Status: Abnormal   Collection Time: 05/03/21  5:42 PM  Result Value Ref Range Status   Result 1 Comment (A)  Final    Comment: (NOTE) Fungal elements, such as arthroconidia, hyphal fragments, chlamydoconidia, observed. Performed At: [facility] [place], [place] [id number] Rush Farmer MD WV:[id number]       Imaging Studies   No results found.   Medications   Scheduled Meds:  ascorbic acid  250 mg Oral Q supper   aspirin EC  81 mg Oral QHS   atorvastatin  80 mg Oral Daily   brimonidine  1 drop Right Eye BID   Chlorhexidine Gluconate  Cloth  6 each Topical Daily   docusate sodium  100 mg Oral Daily   insulin aspart  0-15 Units Subcutaneous TID WC   insulin detemir  6 Units Subcutaneous QHS   metoprolol tartrate  37.5 mg Oral BID   pantoprazole  40 mg Oral Once per day on Mon Wed Fri   polyethylene glycol  17 g Oral Daily   pyridoxine  100 mg Oral QPM   saccharomyces boulardii  250 mg Oral BID   sodium chloride flush  3 mL Intravenous Q12H   sodium hypochlorite   Irrigation BID   Continuous Infusions:  sodium chloride     cefTRIAXone (ROCEPHIN)  IV 2 g (05/09/21 0906)   heparin 1,800 Units/hr (05/09/21 1346)   magnesium sulfate bolus IVPB         LOS: 12 days    Time spent: 25 minutes with > 50% spent at bedside and in coordination of care     Ezekiel Slocumb, DO [facility]  05/09/2021, 6:22 PM      If 7PM-7AM, please contact night-coverage. How to contact the [facility] Attending or Consulting provider [place] or covering provider during after hours [place], for this patient?    Check the care team in [facility] and look for a) attending/consulting TRH provider listed and b) the [facility] team listed Log into [email or web address] and use [place]'s universal password to access. If  you do not have the password, please contact the hospital operator. Locate the [facility] provider you are looking for under [facility] and page to a number that you can be directly reached. If you still have difficulty reaching the provider, please page the [facility] (Director on Call) for the Hospitalists listed on amion for assistance.

## 2021-05-09 NOTE — Progress Notes (Signed)
Inpatient Diabetes Program Recommendations  AACE/ADA: New Consensus Statement on Inpatient Glycemic Control (2015)  Target Ranges:  Prepandial:   less than 140 mg/dL      Peak postprandial:   less than 180 mg/dL (1-2 hours)      Critically ill patients:  140 - 180 mg/dL   Lab Results  Component Value Date   GLUCAP 197 (H) 05/09/2021   HGBA1C 7.2 (H) 04/27/2021    Spoke with patient at length regarding concern for hyperglycemia. Educated patient on changes in insulin resistance, glucose release during infection, oral agents not recommended while inpatient, impact of covid, target goals while inpatient for advanced age and basal vs short acting insulin.   Patient expresses frustration with blood sugars.  Reviewed patient's current A1c of 7.2% indicating good outpatient control. Explained what a A1c is and what it measures. Also reviewed goal A1c with patient, importance of good glucose control @ home, and blood sugar goals.  Reassurance given to patient.  At this time, could consider Levemir 6 units QHS. Secure chat sent to MD.   Thanks, Bronson Curb, MSN, RNC-OB Diabetes Coordinator [phone] (8a-5p)

## 2021-05-09 NOTE — Progress Notes (Signed)
[place] for heparin  Indication: graft patency s/p L common femoral-tibioperoneal trunk bypass   Allergies  Allergen Reactions   Bactrim [Sulfamethoxazole-Trimethoprim] Other (See Comments)    ^ K+( elevated)    Demerol [Meperidine]     Delusional    Scopolamine     Delusional    Tramadol Other (See Comments)    Keeps awake   Versed [Midazolam] Anxiety    Frantic, out of my mind, agitated     Patient Measurements: Height: 5' 4.5" (163.8 cm) Weight: 68.5 kg (151 lb 0.2 oz) IBW/kg (Calculated) : 55.85 Heparin Dosing Weight: 68kg  Vital Signs: Temp: 98.1 F (36.7 C) (08/16 0740) Temp Source: Oral (08/16 0740) BP: 155/76 (08/16 0740) Pulse Rate: 74 (08/16 0410)  Labs: Recent Labs    05/07/21 0039 05/08/21 0133 05/08/21 0321 05/09/21 0117  HGB 7.7* 7.7*  --  7.4*  HCT 24.1* 24.4*  --  24.2*  PLT 406* 437*  --  482*  HEPARINUNFRC 0.26*  --  0.49 0.28*  CREATININE 1.15*  --   --  1.01*     Estimated Creatinine Clearance: 43.4 mL/min (A) (by C-G formula based on SCr of 1.01 mg/dL (H)).    Assessment: 80 y.o. female admitted with osteo and grangrene of toe now s/p left common femoral bypass and toe amputation 8/10. No anticoagulation at home. Pharmacy consulted for therapeutic heparin dosing to promote graft patency. Per VVS, planning eventual transition to Xarelto 2.5 mg BID when H/H stable.   Heparin level just below goal this AM at 0.28.  Hgb drifting down, although no overt bleeding or complications noted.  Awaiting surgical plan.  Goal of Therapy:  Heparin level 0.3-0.7 units/ml Monitor platelets by anticoagulation protocol: Yes   Plan:  Increase IV heparin to 1800 units/hr. Daily heparin level and CBC F/U transition to PAD dose Xarelto eventually.  [place] Crane, Roylene Reason, BCCP Clinical Pharmacist  05/09/2021 7:49 AM   [facility] pharmacy phone numbers are listed on [email or web address]

## 2021-05-09 NOTE — Progress Notes (Signed)
Pt has been concerned throughout the day about her meals being delivered without her ordering it, carb count too high, cbg levels too high, etc. Called cafeteria and had order room service/ carb modified. Initially, on isolation so trays were carb mod house tray with disposable items. This was corrected. Pt wanted to speak to dietary concerning the amount of carb allowed for diabetic pts. DD Joy aware and contacted dietary to address. Md and diabetes coordinator aware of pt concerns. At supper pt upset that her cbg's are still too high and she wants to be addressed. Md aware and I called diabetes coordinator to speak with patient. Pt resting with call bell within reach.  Will continue to monitor. .mec

## 2021-05-09 NOTE — Telephone Encounter (Signed)
Per pharmacy medicare part B only accepts qd testing for non-insulin patients unless provide additional documentation. Is it ok to change to qd ?

## 2021-05-10 ENCOUNTER — Inpatient Hospital Stay (HOSPITAL_COMMUNITY): Payer: Medicare Other

## 2021-05-10 ENCOUNTER — Other Ambulatory Visit: Payer: Self-pay

## 2021-05-10 DIAGNOSIS — I10 Essential (primary) hypertension: Secondary | ICD-10-CM | POA: Diagnosis not present

## 2021-05-10 DIAGNOSIS — M86172 Other acute osteomyelitis, left ankle and foot: Secondary | ICD-10-CM | POA: Diagnosis not present

## 2021-05-10 DIAGNOSIS — E1165 Type 2 diabetes mellitus with hyperglycemia: Secondary | ICD-10-CM

## 2021-05-10 DIAGNOSIS — N179 Acute kidney failure, unspecified: Secondary | ICD-10-CM | POA: Diagnosis not present

## 2021-05-10 DIAGNOSIS — D638 Anemia in other chronic diseases classified elsewhere: Secondary | ICD-10-CM | POA: Diagnosis not present

## 2021-05-10 DIAGNOSIS — R0689 Other abnormalities of breathing: Secondary | ICD-10-CM

## 2021-05-10 LAB — CBC
HCT: 25 % — ABNORMAL LOW (ref 36.0–46.0)
Hemoglobin: 7.9 g/dL — ABNORMAL LOW (ref 12.0–15.0)
MCH: 28.9 pg (ref 26.0–34.0)
MCHC: 31.6 g/dL (ref 30.0–36.0)
MCV: 91.6 fL (ref 80.0–100.0)
Platelets: 513 10*3/uL — ABNORMAL HIGH (ref 150–400)
RBC: 2.73 MIL/uL — ABNORMAL LOW (ref 3.87–5.11)
RDW: 15.1 % (ref 11.5–15.5)
WBC: 16.9 10*3/uL — ABNORMAL HIGH (ref 4.0–10.5)
nRBC: 0.4 % — ABNORMAL HIGH (ref 0.0–0.2)

## 2021-05-10 LAB — BASIC METABOLIC PANEL
Anion gap: 5 (ref 5–15)
BUN: 14 mg/dL (ref 8–23)
CO2: 23 mmol/L (ref 22–32)
Calcium: 8.5 mg/dL — ABNORMAL LOW (ref 8.9–10.3)
Chloride: 111 mmol/L (ref 98–111)
Creatinine, Ser: 0.92 mg/dL (ref 0.44–1.00)
GFR, Estimated: >60 mL/min (ref >60)
Glucose, Bld: 197 mg/dL — ABNORMAL HIGH (ref 70–99)
Potassium: 4.2 mmol/L (ref 3.5–5.1)
Sodium: 139 mmol/L (ref 135–145)

## 2021-05-10 LAB — GLUCOSE, CAPILLARY
Glucose-Capillary: 178 mg/dL — ABNORMAL HIGH (ref 70–99)
Glucose-Capillary: 176 mg/dL — ABNORMAL HIGH (ref 70–99)
Glucose-Capillary: 179 mg/dL — ABNORMAL HIGH (ref 70–99)
Glucose-Capillary: 187 mg/dL — ABNORMAL HIGH (ref 70–99)

## 2021-05-10 LAB — HEPARIN LEVEL (UNFRACTIONATED): Heparin Unfractionated: 0.5 IU/mL (ref 0.30–0.70)

## 2021-05-10 MED ORDER — FUROSEMIDE 10 MG/ML IJ SOLN
20.0000 mg | Freq: Once | INTRAMUSCULAR | Status: AC
  Administered 2021-05-10: 20 mg via INTRAVENOUS
  Filled 2021-05-10: qty 2

## 2021-05-10 MED ORDER — ONETOUCH VERIO VI STRP: ORAL_STRIP | 5 refills | Status: DC

## 2021-05-10 MED ORDER — CEFAZOLIN SODIUM-DEXTROSE 2-4 GM/100ML-% IV SOLN
2.0000 g | Freq: Three times a day (TID) | INTRAVENOUS | Status: DC
  Administered 2021-05-10 – 2021-05-15 (×16): 2 g via INTRAVENOUS
  Filled 2021-05-10 (×20): qty 100

## 2021-05-10 NOTE — Progress Notes (Signed)
Occupational Therapy Treatment Patient Details Name: [name] MRN: [id number] DOB: 02-Dec-1940 Today's Date: 05/10/2021    History of present illness Pt adm 8/4 with lt third toe gangrene. Pt underwent abdominal aortogram with bilateral lower extremity runoff on 8/5. Pt underwent lt 3rd toe amputation  and lt common femoral - tibioperoneal trunk bypass on 8/10. Pt with incidental covid + finding. PMH - multiple LE vascular surgeries, rt transmetatarsal amputation, HTN, CABG, DM, PVD, diabetic neuropathy   OT comments  Pt progressing well with OT goals this session. She participated in functional mobility, ambulating in the room, working on transfers, grooming standing at the sink, and toileting while practicing weightbearing only on her RLE to prepare for potential NWB/TDWB s/p transmet amputation tomorrow (8/18). Overall pt at a supervision level with all functional mobility and most ADL's. Requiring minimal assist with donning/doffing L darko brace due to increased swelling and pain at this time. OT continuing to recommend HHOT once medically stable to discharge.   Follow Up Recommendations  Home health OT;Supervision - Intermittent    Equipment Recommendations  None recommended by OT    Recommendations for Other Services      Precautions / Restrictions Precautions Precautions: Fall Required Braces or Orthoses: Other Brace Other Brace: Darco shoe on L foot. Pt also wears Darco on R foot (for almost 2 years) Restrictions Weight Bearing Restrictions: Yes LLE Weight Bearing: Partial weight bearing Other Position/Activity Restrictions: weight bearing on heel       Mobility Bed Mobility Overal bed mobility: Modified Independent Bed Mobility: Supine to Sit     Supine to sit: Modified independent (Device/Increase time);HOB elevated Sit to supine: Modified independent (Device/Increase time)   General bed mobility comments: light use of bedrail    Transfers Overall transfer  level: Needs assistance Equipment used: Rolling walker (2 wheeled) Transfers: Sit to/from Stand Sit to Stand: Supervision         General transfer comment: No difficulties standing from recliner this session.    Balance Overall balance assessment: No apparent balance deficits (not formally assessed)                                         ADL either performed or assessed with clinical judgement   ADL Overall ADL's : Needs assistance/impaired     Grooming: Wash/dry face;Oral care;Supervision/safety;Standing Grooming Details (indicate cue type and reason): Standing at sink, working on 1 leg standing balance             Lower Body Dressing: Minimal assistance;Sitting/lateral leans Lower Body Dressing Details (indicate cue type and reason): able to don R darco shoe but required assistance with the L (top strap only). Pt reports L LE swelling and unable to successfully cross LEs at this time. Pt independently problem solved likely able to don L shoe if propped up on a stool as this is what she did with R LE to start with Toilet Transfer: Supervision/safety;Ambulation Toilet Transfer Details (indicate cue type and reason): ambulated to bathroom and back         Functional mobility during ADLs: Supervision/safety;Rolling walker General ADL Comments: Supervision for functional mobility with RW, pt beginning to work on 1 leg balance to prepare for potential NWB/TDWB s/p transmet ampuatation.     Vision   Vision Assessment?: No apparent visual deficits   Perception     Praxis      Cognition  Arousal/Alertness: Awake/alert Behavior During Therapy: WFL for tasks assessed/performed Overall Cognitive Status: Within Functional Limits for tasks assessed                                          Exercises     Shoulder Instructions       General Comments      Pertinent Vitals/ Pain       Pain Assessment: 0-10 Pain Score: 4  Pain  Location: LLE Pain Descriptors / Indicators: Discomfort Pain Intervention(s): Monitored during session;Repositioned  Home Living                                          Prior Functioning/Environment              Frequency  Min 2X/week        Progress Toward Goals  OT Goals(current goals can now be found in the care plan section)  Progress towards OT goals: Progressing toward goals  Acute Rehab OT Goals Patient Stated Goal: to improve L LE, get amputation done OT Goal Formulation: With patient Time For Goal Achievement: 05/18/21 Potential to Achieve Goals: Good ADL Goals Pt Will Perform Grooming: with modified independence;standing Pt Will Perform Lower Body Dressing: with modified independence;sit to/from stand Pt Will Transfer to Toilet: with modified independence;ambulating  Plan Discharge plan remains appropriate    Co-evaluation                 AM-PAC OT "6 Clicks" Daily Activity     Outcome Measure   Help from another person eating meals?: None Help from another person taking care of personal grooming?: A Little Help from another person toileting, which includes using toliet, bedpan, or urinal?: A Little Help from another person bathing (including washing, rinsing, drying)?: A Little Help from another person to put on and taking off regular upper body clothing?: None Help from another person to put on and taking off regular lower body clothing?: A Little 6 Click Score: 20    End of Session Equipment Utilized During Treatment: Rolling walker;Gait belt  OT Visit Diagnosis: Unsteadiness on feet (R26.81);Other abnormalities of gait and mobility (R26.89)   Activity Tolerance Patient tolerated treatment well   Patient Left in bed;with call bell/phone within reach   Nurse Communication Mobility status        Time: 1540-0867 OT Time Calculation (min): 18 min  Charges: OT General Charges $OT Visit: 1 Visit OT Treatments $Self  Care/Home Management : 8-22 mins  [name] H., OTR/L [place] 05/10/2021, 2:03 PM

## 2021-05-10 NOTE — Progress Notes (Signed)
PROGRESS NOTE    [name]  EZM:[id number] DOB: [birth year] DOA: 04/27/2021 PCP: [name], MD   Brief Narrative: [name] is a 80 y.o. female with a history of hypertension, hyperlipidemia, CAD status post CABG, PAD status post bypass grafting, right transmetatarsal amputation, diabetes mellitus type 2.  Patient presented secondary to left toe wound with associated osteomyelitis.  Patient was initially managed on antibiotics and underwent left fem-pop bypass in addition to third toe amputation.  Now plan for left transmetatarsal amputation.   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 Patient started empirically on Vancomycin and Zosyn. She underwent left common femoral to tibioperoneal trunk bypass and left third toe amputation on 10/8.  Wound cultures (8/10) initially significant for GPCs and she was transitioned to Vancomycin monotherapy, then to Ceftriaxone IV. Culture sensitivities significant for oxacillin/penicillin sensitive Staph aureus/Streptococcus anginosus. Left foot wound with continued purulent drainage -Transition to cefazolin IV possibly discontinue antibiotics depending on results of amputation -Vascular surgery recommendations: Plan for left transmetatarsal amputation on 8/18  Sepsis Present on admission.  Secondary to cellulitis/osteomyelitis.  Antibiotics as mentioned above.  Physiology currently resolved.  Peripheral artery disease Patient has a history of right transmetatarsal amputation in 2020.  Patient is on aspirin and Plavix as an outpatient in  addition to Lipitor.  Plavix discontinued secondary to need for surgical management and patient started on heparin IV. -Continue aspirin, Lipitor, heparin IV with plans to discharge on Xarelto 2.5 mg twice daily  AKI Baseline creatinine of about 1.  Creatinine of 1.41 on admission with a peak creatinine of 1.6.  Currently resolved.  Hyponatremia Mild.  Resolved.  COVID-19 infection  Decreased breath sounds History of right pleural effusion from 2021. Also history of grade 1 diastolic heart dysfunction. Asymptomatic. Recent COVID-19 infection. -Chest x-ray  CAD s/p CABG Patient is on telmisartan, Lipitor, Plavix, aspirin as an outpatient.  ARB held secondary to AKI.  Plavix held secondary to above.  Diabetes mellitus, type 2 Patient is on glimepiride and Invokana as an outpatient.  Hemoglobin A1c of 7.2%.  Patient started on Levemir 60 units daily and SSI while inpatient. -Continue Levemir and SSI  Chronic iron deficiency anemia Acute on chronic anemia It appears patient received 2 units of PRBC to date in setting of recent left femoral-below knee popliteal artery bypass grafting. Associated iron deficiency. Currently stable. -May give IV iron prior to discharge.    DVT prophylaxis: Heparin IV Code Status:   Code Status: Full Code Family Communication: None at bedside Disposition Plan: Discharge home likely in 2 to 3 days pending vascular surgery recommendations/management   Consultants:  Vascular surgery  Procedures:  LEFT COMMON FEMORAL - TIBIOPERONEAL TRUNK BYPASS (05/03/2021) LEFT THIRD TOE OPEN AMPUTATION (05/03/2021)  Antimicrobials: Vancomycin Cefepime Ceftriaxone Ancef    Subjective: Patient is aware of plan for transmetatarsal amputation on 8/18. Patient walked independently at home prior to admission. Lives at home with daughter. No chest pain, dyspnea, or coughing.  Objective: Vitals:   05/09/21 2033 05/09/21 2334 05/10/21 0350 05/10/21 0823  BP: (!)  178/66 (!) 142/59 Marland Kitchen)  148/55 (!) 169/62  Pulse: 98 76 87 99  Resp: 16 16 16 14   Temp: 98.7 F (37.1 C) 98.7 F (37.1 C) 98.2 F (36.8 C) 98.7 F (37.1 C)  TempSrc: Oral Oral Oral Oral  SpO2:  100% 100% 98%  Weight:      Height:        Intake/Output Summary (Last 24 hours) at 05/10/2021 0908 Last data filed at 05/10/2021 1517 Gross per 24 hour  Intake 260 ml  Output --  Net 260 ml   Filed Weights   04/27/21 1500  Weight: 68.5 kg    Examination:  General exam: Appears calm and comfortable Respiratory system: Clear to auscultation on left with diminished breath sounds/rales on right. Respiratory effort normal. Cardiovascular system: S1 & S2 heard, RRR. No murmurs, rubs, gallops or clicks. Gastrointestinal system: Abdomen is nondistended, soft and nontender. No organomegaly or masses felt. Normal bowel sounds heard. Central nervous system: Alert and oriented. No focal neurological deficits. Musculoskeletal: LLE 2+ pitting edema. No calf tenderness. Left foot in clean dressing Skin: No cyanosis. No rashes Psychiatry: Judgement and insight appear normal. Mood & affect appropriate.     Data Reviewed: I have personally reviewed following labs and imaging studies  CBC Lab Results  Component Value Date   WBC 16.9 (H) 05/10/2021   RBC 2.73 (L) 05/10/2021   HGB 7.9 (L) 05/10/2021   HCT 25.0 (L) 05/10/2021   MCV 91.6 05/10/2021   MCH 28.9 05/10/2021   PLT 513 (H) 05/10/2021   MCHC 31.6 05/10/2021   RDW 15.1 05/10/2021   LYMPHSABS 1.2 04/27/2021   MONOABS 1.9 (H) 04/27/2021   EOSABS 0.0 04/27/2021   BASOSABS 0.1 61/60/7371     Last metabolic panel Lab Results  Component Value Date   NA 139 05/10/2021   K 4.2 05/10/2021   CL 111 05/10/2021   CO2 23 05/10/2021   BUN 14 05/10/2021   CREATININE 0.92 05/10/2021   GLUCOSE 197 (H) 05/10/2021   GFRNONAA >60 05/10/2021   GFRAA 75 07/29/2020   CALCIUM 8.5 (L) 05/10/2021   PROT 7.1 08/10/2020   ALBUMIN 4.1 08/10/2020    BILITOT 0.5 08/10/2020   ALKPHOS 62 08/10/2020   AST 25 08/10/2020   ALT 30 08/10/2020   ANIONGAP 5 05/10/2021    CBG (last 3)  Recent Labs    05/09/21 1609 05/09/21 2131 05/10/21 0612  GLUCAP 197* 187* 178*     GFR: Estimated Creatinine Clearance: 47.7 mL/min (by C-G formula based on SCr of 0.92 mg/dL).  Coagulation Profile: No results for input(s): INR, PROTIME in the last 168 hours.  Recent Results (from the past 240 hour(s))  Fungus Culture With Stain     Status: Abnormal (Preliminary result)   Collection Time: 05/03/21  5:42 PM   Specimen: PATH Other; Tissue  Result Value Ref Range Status   Fungus Stain Final report (A)  Final    Comment: (NOTE) Performed At: [facility] [place], [place] [id number] Rush Farmer MD OE:[id number]    Fungus (Mycology) Culture PENDING  Incomplete   Fungal Source WOUND  Final    Comment: LEFT THIRD TOE SWABS FOR CULTURE SPEC A Performed at [facility], [place] [address], [place], [place] [address]   Aerobic/Anaerobic Culture w Gram Stain (surgical/deep wound)     Status: None   Collection Time: 05/03/21  5:42 PM   Specimen: PATH Other; Tissue  Result Value Ref Range Status   Specimen Description WOUND  Final  Special Requests LEFT THIRD TOE SWABS FOR CULTURE SPEC A  Final   Gram Stain   Final    RARE WBC PRESENT,BOTH PMN AND MONONUCLEAR RARE GRAM POSITIVE COCCI    Culture   Final    FEW STAPHYLOCOCCUS AUREUS FEW STREPTOCOCCUS ANGINOSIS NO ANAEROBES ISOLATED Performed at [facility], [place] [address], [place],  [address]    Report Status 05/09/2021 FINAL  Final   Organism ID, Bacteria STAPHYLOCOCCUS AUREUS  Final   Organism ID, Bacteria STREPTOCOCCUS ANGINOSIS  Final      Susceptibility   Staphylococcus aureus - MIC*    CIPROFLOXACIN <=0.5 SENSITIVE Sensitive     ERYTHROMYCIN >=8 RESISTANT Resistant     GENTAMICIN <=0.5 SENSITIVE Sensitive     OXACILLIN 0.5 SENSITIVE Sensitive      TETRACYCLINE <=1 SENSITIVE Sensitive     VANCOMYCIN 1 SENSITIVE Sensitive     TRIMETH/SULFA <=10 SENSITIVE Sensitive     CLINDAMYCIN RESISTANT Resistant     RIFAMPIN <=0.5 SENSITIVE Sensitive     Inducible Clindamycin POSITIVE Resistant     * FEW STAPHYLOCOCCUS AUREUS   Streptococcus anginosis - MIC*    PENICILLIN <=0.06 SENSITIVE Sensitive     CEFTRIAXONE 0.25 SENSITIVE Sensitive     ERYTHROMYCIN <=0.12 SENSITIVE Sensitive     LEVOFLOXACIN 0.5 SENSITIVE Sensitive     VANCOMYCIN 0.5 SENSITIVE Sensitive     * FEW STREPTOCOCCUS ANGINOSIS  Fungus Culture Result     Status: Abnormal   Collection Time: 05/03/21  5:42 PM  Result Value Ref Range Status   Result 1 Comment (A)  Final    Comment: (NOTE) Fungal elements, such as arthroconidia, hyphal fragments, chlamydoconidia, observed. Performed At: [facility] [place], [place] [id number] Rush Farmer MD ZL:[id number]         Radiology Studies: No results found.      Scheduled Meds:  ascorbic acid  250 mg Oral Q supper   aspirin EC  81 mg Oral QHS   atorvastatin  80 mg Oral Daily   brimonidine  1 drop Right Eye BID   Chlorhexidine Gluconate Cloth  6 each Topical Daily   docusate sodium  100 mg Oral Daily   insulin aspart  0-15 Units Subcutaneous TID WC   insulin detemir  6 Units Subcutaneous QHS   metoprolol tartrate  37.5 mg Oral BID   pantoprazole  40 mg Oral Once per day on Mon Wed Fri   polyethylene glycol  17 g Oral Daily   pyridoxine  100 mg Oral QPM   saccharomyces boulardii  250 mg Oral BID   sodium chloride flush  3 mL Intravenous Q12H   sodium hypochlorite   Irrigation BID   Continuous Infusions:  sodium chloride     heparin 1,800 Units/hr (05/10/21 0444)   magnesium sulfate bolus IVPB       LOS: 13 days     Cordelia Poche, MD [facility] 05/10/2021, 9:08 AM  If 7PM-7AM, please contact night-coverage [email or web address]

## 2021-05-10 NOTE — Progress Notes (Addendum)
  Progress Note    05/10/2021 8:05 AM 7 Days Post-Op  Subjective:  no complaints   Vitals:   05/09/21 2334 05/10/21 0350  BP: (!) 142/59 (!) 148/55  Pulse: 76 87  Resp: 16 16  Temp: 98.7 F (37.1 C) 98.2 F (36.8 C)  SpO2: 100% 100%   Physical Exam: Cardiac:  regular Lungs:  non labored Extremities:  LLE well perfused and warm. Left 3rd toe open amputation site with fibrinous exudate, minimal granulation tissue present only in dorsal aspect of wound. Drainage and tracking along plantar aspect of wound  Neurologic: alert and oriented  CBC    Component Value Date/Time   WBC 16.9 (H) 05/10/2021 0110   RBC 2.73 (L) 05/10/2021 0110   HGB 7.9 (L) 05/10/2021 0110   HGB 12.2 07/29/2020 0942   HCT 25.0 (L) 05/10/2021 0110   HCT 37.4 07/29/2020 0942   PLT 513 (H) 05/10/2021 0110   PLT 304 07/29/2020 0942   MCV 91.6 05/10/2021 0110   MCV 84 07/29/2020 0942   MCH 28.9 05/10/2021 0110   MCHC 31.6 05/10/2021 0110   RDW 15.1 05/10/2021 0110   RDW 14.8 07/29/2020 0942   LYMPHSABS 1.2 04/27/2021 1228   MONOABS 1.9 (H) 04/27/2021 1228   EOSABS 0.0 04/27/2021 1228   BASOSABS 0.1 04/27/2021 1228    BMET    Component Value Date/Time   NA 139 05/10/2021 0110   NA 142 07/29/2020 0942   K 4.2 05/10/2021 0110   CL 111 05/10/2021 0110   CO2 23 05/10/2021 0110   GLUCOSE 197 (H) 05/10/2021 0110   BUN 14 05/10/2021 0110   BUN 17 07/29/2020 0942   CREATININE 0.92 05/10/2021 0110   CREATININE 0.86 12/09/2019 1136   CALCIUM 8.5 (L) 05/10/2021 0110   GFRNONAA >60 05/10/2021 0110   GFRAA 75 07/29/2020 0942    INR    Component Value Date/Time   INR 1.1 04/27/2021 1228     Intake/Output Summary (Last 24 hours) at 05/10/2021 0805 Last data filed at 05/09/2021 2113 Gross per 24 hour  Intake 120 ml  Output --  Net 120 ml     Assessment/Plan:  80 y.o. female is s/p ) left common femoral - tibioperoneal trunk bypass with 8mm ringed ePTFE in anatomic tunnel and left third toe  open amputation on 05/03/2021 by Dr. Stanford Breed 7 Days Post-Op   LLE well perfused. Amp site not granulating well. There is drainage and tracking along plantar aspect of foot. Continue Dakins damp to dry dressings BID Patient seen with Dr. Stanford Breed. Recommendation is to proceed with left TMA as wound is not showing signs of healing VSS. Hgb 7.9 today. Continue to monitor Leukocytosis trending down. On vancomycin NPO after midnight Consent in chart  DVT prophylaxis: Heparin gtt   [name], PA-C [facility] [phone] 05/10/2021 8:05 AM  VASCULAR STAFF ADDENDUM: I have independently interviewed and examined the patient. I agree with the above.  No progress with wound healing. She and I would prefer to not wait and wants to proceed with TMA. Will plan to do this tomorrow.  [name]. Stanford Breed, MD [facility] Phone Number: [phone] 05/10/2021 2:15 PM

## 2021-05-10 NOTE — Progress Notes (Signed)
ANTICOAGULATION CONSULT NOTE  Pharmacy Consult:  Heparin + Ancef Indication: graft patency s/p L common femoral-tibioperoneal trunk bypass + toe infection  Allergies  Allergen Reactions   Bactrim [Sulfamethoxazole-Trimethoprim] Other (See Comments)    ^ K+( elevated)    Demerol [Meperidine]     Delusional    Scopolamine     Delusional    Tramadol Other (See Comments)    Keeps awake   Versed [Midazolam] Anxiety    Frantic, out of my mind, agitated     Patient Measurements: Height: 5' 4.5" (163.8 cm) Weight: 68.5 kg (151 lb 0.2 oz) IBW/kg (Calculated) : 55.85 Heparin Dosing Weight: 68kg  Vital Signs: Temp: 98.7 F (37.1 C) (08/17 0823) Temp Source: Oral (08/17 0823) BP: 169/62 (08/17 0823) Pulse Rate: 99 (08/17 0823)  Labs: Recent Labs    05/08/21 0133 05/08/21 0321 05/09/21 0117 05/10/21 0110  HGB 7.7*  --  7.4* 7.9*  HCT 24.4*  --  24.2* 25.0*  PLT 437*  --  482* 513*  HEPARINUNFRC  --  0.49 0.28* 0.50  CREATININE  --   --  1.01* 0.92     Estimated Creatinine Clearance: 47.7 mL/min (by C-G formula based on SCr of 0.92 mg/dL).    Assessment: 80 y.o. female admitted with osteo and grangrene of toe now s/p left common femoral bypass and toe amputation 8/10.  Pharmacy consulted for therapeutic heparin dosing to promote graft patency. Heparin level therapeutic; no overt bleeding or complications noted.   Pharmacy consulted to narrow from Rocephin to Ancef for MSSA and Strep anginosis toe infection.  Renal function improving, afebrile, WBC trending down.  Awaiting surgical plan.  Goal of Therapy:  Heparin level 0.3-0.7 units/ml Monitor platelets by anticoagulation protocol: Yes Resolution of infection   Plan:  Continue heparin gtt at 1800 units/hr Daily heparin level and CBC   Ancef 2gm IV Q8H Pharmacy will sign off and follow peripherally.  Thank you for the consult!  Rishik Tubby D. Mina Marble, PharmD, BCPS, [place] 05/10/2021, 9:23 AM

## 2021-05-10 NOTE — Progress Notes (Signed)
Physical Therapy Treatment Patient Details Name: [name] MRN: [id number] DOB: May 18, 1941 Today's Date: 05/10/2021    History of Present Illness Pt adm 8/4 with lt third toe gangrene. Pt underwent abdominal aortogram with bilateral lower extremity runoff on 8/5. Pt underwent lt 3rd toe amputation  and lt common femoral - tibioperoneal trunk bypass on 8/10. Pt with incidental covid + finding. PMH - multiple LE vascular surgeries, rt transmetatarsal amputation, HTN, CABG, DM, PVD, diabetic neuropathy    PT Comments    Patient agrees to PT session. She is aware of surgery tomorrow and is ready to get it done and infection handled. Patient is mod independent with bed mobility, requires min assist for sit to stand from bed and requires slightly elevated height. Patient ambulated 125 feet with RW and min guard. She will continue to benefit from skilled PT while here to improve mobility, safety and strength.     Follow Up Recommendations  SNF;Supervision for mobility/OOB     Equipment Recommendations  None recommended by PT    Recommendations for Other Services       Precautions / Restrictions Precautions Precautions: Fall Other Brace: Darco shoe on L foot. Pt also wears Darco on R foot (for almost 2 years) Restrictions Weight Bearing Restrictions: Yes LLE Weight Bearing: Partial weight bearing Other Position/Activity Restrictions: weight bearing on heel    Mobility  Bed Mobility Overal bed mobility: Modified Independent Bed Mobility: Supine to Sit     Supine to sit: Modified independent (Device/Increase time);HOB elevated     General bed mobility comments: light use of bedrail    Transfers Overall transfer level: Needs assistance Equipment used: Rolling walker (2 wheeled) Transfers: Sit to/from Stand Sit to Stand: Min assist;From elevated surface         General transfer comment: increased time and effort to stand from bed.  Ambulation/Gait Ambulation/Gait  assistance: Min guard Gait Distance (Feet): 125 Feet Assistive device: Rolling walker (2 wheeled) Gait Pattern/deviations: Step-to pattern;Decreased weight shift to left Gait velocity: decr   General Gait Details: Assist for balance and support. Cues to keep weight on heel of left foot.   Stairs             Wheelchair Mobility    Modified Rankin (Stroke Patients Only)       Balance Overall balance assessment: Modified Independent;Needs assistance Sitting-balance support: Feet supported Sitting balance-Leahy Scale: Normal     Standing balance support: Bilateral upper extremity supported;During functional activity Standing balance-Leahy Scale: Fair Standing balance comment: B UE support for ambulation, min guard                            Cognition Arousal/Alertness: Awake/alert Behavior During Therapy: WFL for tasks assessed/performed Overall Cognitive Status: Within Functional Limits for tasks assessed                                        Exercises      General Comments        Pertinent Vitals/Pain Pain Assessment: Faces Faces Pain Scale: Hurts little more Pain Location: LLE Pain Descriptors / Indicators: Discomfort Pain Intervention(s): Monitored during session;Limited activity within patient's tolerance;Repositioned    Home Living                      Prior Function  PT Goals (current goals can now be found in the care plan section) Acute Rehab PT Goals Patient Stated Goal: to improve L LE, get amputation done PT Goal Formulation: With patient Time For Goal Achievement: 05/18/21 Potential to Achieve Goals: Good Progress towards PT goals: Progressing toward goals    Frequency    Min 3X/week      PT Plan Current plan remains appropriate    Co-evaluation              AM-PAC PT "6 Clicks" Mobility   Outcome Measure  Help needed turning from your back to your side while in a flat  bed without using bedrails?: None Help needed moving from lying on your back to sitting on the side of a flat bed without using bedrails?: A Little Help needed moving to and from a bed to a chair (including a wheelchair)?: A Little Help needed standing up from a chair using your arms (e.g., wheelchair or bedside chair)?: A Little Help needed to walk in hospital room?: A Little Help needed climbing 3-5 steps with a railing? : A Lot 6 Click Score: 18    End of Session Equipment Utilized During Treatment: Gait belt Activity Tolerance: Patient tolerated treatment well;Patient limited by fatigue Patient left: in chair;with call bell/phone within reach Nurse Communication: Mobility status PT Visit Diagnosis: Unsteadiness on feet (R26.81);Other abnormalities of gait and mobility (R26.89);Pain;Muscle weakness (generalized) (M62.81);Difficulty in walking, not elsewhere classified (R26.2) Pain - Right/Left: Left Pain - part of body: Leg     Time: 0920-0943 PT Time Calculation (min) (ACUTE ONLY): 23 min  Charges:  $Gait Training: 23-37 mins                    [employer], PT, [employer] 05/10/21,10:06 AM

## 2021-05-10 NOTE — Progress Notes (Signed)
Inpatient Diabetes Program Recommendations  AACE/ADA: New Consensus Statement on Inpatient Glycemic Control   Target Ranges:  Prepandial:   less than 140 mg/dL      Peak postprandial:   less than 180 mg/dL (1-2 hours)      Critically ill patients:  140 - 180 mg/dL   Results for LAQUANNA, VEAZEY (MRN [id number]) as of 05/10/2021 11:36  Ref. Range 05/09/2021 06:23 05/09/2021 11:12 05/09/2021 16:09 05/09/2021 21:31 05/10/2021 06:12 05/10/2021 11:21  Glucose-Capillary Latest Ref Range: 70 - 99 mg/dL 176 (H) 201 (H) 197 (H) 187 (H) 178 (H) 176 (H)    Review of Glycemic Control  Diabetes history: DM2 Outpatient Diabetes medications: Amaryl 2 mg QAM, Metformin 1000 mg BID, Invokana 300 mg daily Current orders for Inpatient glycemic control: Levemir 6 units QHS, Novolog 0-15 units TID with meals  Inpatient Diabetes Program Recommendations:    Insulin: Please consider ordering Novolog 3 units TID with meals for meal coverage if patient eats at least 50% of meals.  Thanks, Barnie Alderman, RN, MSN, CDE Diabetes Coordinator Inpatient Diabetes Program [phone] (Team Pager from 8am to 5pm)

## 2021-05-10 NOTE — Telephone Encounter (Signed)
Updated Rx sent

## 2021-05-11 ENCOUNTER — Inpatient Hospital Stay (HOSPITAL_COMMUNITY): Payer: Medicare Other | Admitting: Anesthesiology

## 2021-05-11 ENCOUNTER — Encounter (HOSPITAL_COMMUNITY)
Admission: EM | Disposition: A | Discharge status: Home Health | Payer: Self-pay | Source: Home / Self Care | Attending: Internal Medicine

## 2021-05-11 DIAGNOSIS — T8754 Necrosis of amputation stump, left lower extremity: Secondary | ICD-10-CM

## 2021-05-11 DIAGNOSIS — N179 Acute kidney failure, unspecified: Secondary | ICD-10-CM | POA: Diagnosis not present

## 2021-05-11 DIAGNOSIS — D638 Anemia in other chronic diseases classified elsewhere: Secondary | ICD-10-CM | POA: Diagnosis not present

## 2021-05-11 DIAGNOSIS — M86172 Other acute osteomyelitis, left ankle and foot: Secondary | ICD-10-CM | POA: Diagnosis not present

## 2021-05-11 DIAGNOSIS — E11628 Type 2 diabetes mellitus with other skin complications: Secondary | ICD-10-CM | POA: Diagnosis not present

## 2021-05-11 HISTORY — PX: TRANSMETATARSAL AMPUTATION: SHX6197

## 2021-05-11 LAB — CBC
HCT: 24.9 % — ABNORMAL LOW (ref 36.0–46.0)
Hemoglobin: 7.9 g/dL — ABNORMAL LOW (ref 12.0–15.0)
MCH: 28.9 pg (ref 26.0–34.0)
MCHC: 31.7 g/dL (ref 30.0–36.0)
MCV: 91.2 fL (ref 80.0–100.0)
Platelets: 515 10*3/uL — ABNORMAL HIGH (ref 150–400)
RBC: 2.73 MIL/uL — ABNORMAL LOW (ref 3.87–5.11)
RDW: 15.8 % — ABNORMAL HIGH (ref 11.5–15.5)
WBC: 14.5 10*3/uL — ABNORMAL HIGH (ref 4.0–10.5)
nRBC: 0.3 % — ABNORMAL HIGH (ref 0.0–0.2)

## 2021-05-11 LAB — GLUCOSE, CAPILLARY
Glucose-Capillary: 147 mg/dL — ABNORMAL HIGH (ref 70–99)
Glucose-Capillary: 120 mg/dL — ABNORMAL HIGH (ref 70–99)
Glucose-Capillary: 148 mg/dL — ABNORMAL HIGH (ref 70–99)
Glucose-Capillary: 152 mg/dL — ABNORMAL HIGH (ref 70–99)
Glucose-Capillary: 184 mg/dL — ABNORMAL HIGH (ref 70–99)
Glucose-Capillary: 198 mg/dL — ABNORMAL HIGH (ref 70–99)

## 2021-05-11 LAB — HEPARIN LEVEL (UNFRACTIONATED): Heparin Unfractionated: 0.47 IU/mL (ref 0.30–0.70)

## 2021-05-11 SURGERY — AMPUTATION, FOOT, TRANSMETATARSAL
Anesthesia: Monitor Anesthesia Care | Site: Foot | Laterality: Left

## 2021-05-11 MED ORDER — LACTATED RINGERS IV SOLN: INTRAVENOUS | Status: DC | PRN

## 2021-05-11 MED ORDER — CHLORHEXIDINE GLUCONATE 0.12 % MT SOLN
OROMUCOSAL | Status: AC
  Administered 2021-05-11: 15 mL
  Filled 2021-05-11: qty 15

## 2021-05-11 MED ORDER — PROPOFOL 500 MG/50ML IV EMUL
INTRAVENOUS | Status: DC | PRN
  Administered 2021-05-11: 40 ug/kg/min via INTRAVENOUS

## 2021-05-11 MED ORDER — LIDOCAINE HCL (CARDIAC) PF 100 MG/5ML IV SOSY
PREFILLED_SYRINGE | INTRAVENOUS | Status: DC | PRN
  Administered 2021-05-11: 40 mg via INTRATRACHEAL

## 2021-05-11 MED ORDER — MIDAZOLAM HCL 2 MG/2ML IJ SOLN
INTRAMUSCULAR | Status: AC
  Filled 2021-05-11: qty 2

## 2021-05-11 MED ORDER — FENTANYL CITRATE (PF) 250 MCG/5ML IJ SOLN
INTRAMUSCULAR | Status: AC
  Filled 2021-05-11: qty 5

## 2021-05-11 MED ORDER — LIDOCAINE 2% (20 MG/ML) 5 ML SYRINGE
INTRAMUSCULAR | Status: AC
  Filled 2021-05-11: qty 5

## 2021-05-11 MED ORDER — PROPOFOL 10 MG/ML IV BOLUS
INTRAVENOUS | Status: DC | PRN
  Administered 2021-05-11: 30 mg via INTRAVENOUS

## 2021-05-11 MED ORDER — 0.9 % SODIUM CHLORIDE (POUR BTL) OPTIME
TOPICAL | Status: DC | PRN
  Administered 2021-05-11: 1000 mL

## 2021-05-11 MED ORDER — FENTANYL CITRATE (PF) 100 MCG/2ML IJ SOLN
100.0000 ug | Freq: Once | INTRAMUSCULAR | Status: AC
Start: 2021-05-11 — End: 2021-05-11

## 2021-05-11 MED ORDER — ROPIVACAINE HCL 5 MG/ML IJ SOLN
INTRAMUSCULAR | Status: DC | PRN
  Administered 2021-05-11: 30 mL via PERINEURAL

## 2021-05-11 MED ORDER — PROPOFOL 10 MG/ML IV BOLUS
INTRAVENOUS | Status: AC
  Filled 2021-05-11: qty 20

## 2021-05-11 MED ORDER — FENTANYL CITRATE (PF) 100 MCG/2ML IJ SOLN
INTRAMUSCULAR | Status: AC
  Administered 2021-05-11: 100 ug
  Filled 2021-05-11: qty 2

## 2021-05-11 SURGICAL SUPPLY — 24 items
BLADE SAW SGTL 81X20 HD (BLADE) ×2 IMPLANT
BNDG ELASTIC 4X5.8 VLCR STR LF (GAUZE/BANDAGES/DRESSINGS) ×2 IMPLANT
BNDG GAUZE ELAST 4 BULKY (GAUZE/BANDAGES/DRESSINGS) ×4 IMPLANT
CANISTER SUCT 3000ML PPV (MISCELLANEOUS) ×2 IMPLANT
COVER SURGICAL LIGHT HANDLE (MISCELLANEOUS) ×2 IMPLANT
DRAPE HALF SHEET 40X57 (DRAPES) ×2 IMPLANT
ELECT REM PT RETURN 9FT ADLT (ELECTROSURGICAL) ×2
ELECTRODE REM PT RTRN 9FT ADLT (ELECTROSURGICAL) ×1 IMPLANT
GAUZE SPONGE 4X4 12PLY STRL (GAUZE/BANDAGES/DRESSINGS) ×2 IMPLANT
GLOVE SURG ENC MOIS LTX SZ7.5 (GLOVE) ×2 IMPLANT
GOWN STRL REUS W/ TWL LRG LVL3 (GOWN DISPOSABLE) ×2 IMPLANT
GOWN STRL REUS W/ TWL XL LVL3 (GOWN DISPOSABLE) ×1 IMPLANT
GOWN STRL REUS W/TWL LRG LVL3 (GOWN DISPOSABLE) ×4
GOWN STRL REUS W/TWL XL LVL3 (GOWN DISPOSABLE) ×2
KIT BASIN OR (CUSTOM PROCEDURE TRAY) ×2 IMPLANT
KIT TURNOVER KIT B (KITS) ×2 IMPLANT
NS IRRIG 1000ML POUR BTL (IV SOLUTION) ×2 IMPLANT
PACK GENERAL/GYN (CUSTOM PROCEDURE TRAY) ×2 IMPLANT
PAD ARMBOARD 7.5X6 YLW CONV (MISCELLANEOUS) ×4 IMPLANT
SPECIMEN JAR SMALL (MISCELLANEOUS) ×2 IMPLANT
TOWEL GREEN STERILE (TOWEL DISPOSABLE) ×4 IMPLANT
TOWEL GREEN STERILE FF (TOWEL DISPOSABLE) ×2 IMPLANT
UNDERPAD 30X36 HEAVY ABSORB (UNDERPADS AND DIAPERS) ×2 IMPLANT
WATER STERILE IRR 1000ML POUR (IV SOLUTION) ×2 IMPLANT

## 2021-05-11 NOTE — Plan of Care (Signed)

## 2021-05-11 NOTE — Progress Notes (Signed)
  Progress Note    05/11/2021 10:38 AM Day of Surgery  Subjective:  no overnight issues  Vitals:   05/11/21 1012 05/11/21 1030  BP: (!) 173/62 (!) 197/77  Pulse: 100 (!) 101  Resp: 13 18  Temp:  98.1 F (36.7 C)  SpO2:  99%    Physical Exam: Awake alert oriented Left foot dressing clean dry intact  CBC    Component Value Date/Time   WBC 14.5 (H) 05/11/2021 0236   RBC 2.73 (L) 05/11/2021 0236   HGB 7.9 (L) 05/11/2021 0236   HGB 12.2 07/29/2020 0942   HCT 24.9 (L) 05/11/2021 0236   HCT 37.4 07/29/2020 0942   PLT 515 (H) 05/11/2021 0236   PLT 304 07/29/2020 0942   MCV 91.2 05/11/2021 0236   MCV 84 07/29/2020 0942   MCH 28.9 05/11/2021 0236   MCHC 31.7 05/11/2021 0236   RDW 15.8 (H) 05/11/2021 0236   RDW 14.8 07/29/2020 0942   LYMPHSABS 1.2 04/27/2021 1228   MONOABS 1.9 (H) 04/27/2021 1228   EOSABS 0.0 04/27/2021 1228   BASOSABS 0.1 04/27/2021 1228    BMET    Component Value Date/Time   NA 139 05/10/2021 0110   NA 142 07/29/2020 0942   K 4.2 05/10/2021 0110   CL 111 05/10/2021 0110   CO2 23 05/10/2021 0110   GLUCOSE 197 (H) 05/10/2021 0110   BUN 14 05/10/2021 0110   BUN 17 07/29/2020 0942   CREATININE 0.92 05/10/2021 0110   CREATININE 0.86 12/09/2019 1136   CALCIUM 8.5 (L) 05/10/2021 0110   GFRNONAA >60 05/10/2021 0110   GFRAA 75 07/29/2020 0942    INR    Component Value Date/Time   INR 1.1 04/27/2021 1228     Intake/Output Summary (Last 24 hours) at 05/11/2021 1038 Last data filed at 05/10/2021 2200 Gross per 24 hour  Intake 591.01 ml  Output 300 ml  Net 291.01 ml     Assessment/plan:  80 y.o. female is s/p left lower extremity revascularization with third toe amputation which is nonhealing.  Plans for left transmetatarsal amputation today in the OR.   Koda Defrank C. Donzetta Matters, MD [facility] [place] Office: [phone] Pager: [phone]  05/11/2021 10:38 AM

## 2021-05-11 NOTE — Anesthesia Procedure Notes (Signed)
Procedure Name: MAC Date/Time: 05/11/2021 11:15 AM Performed by: Michele Rockers, CRNA Pre-anesthesia Checklist: Patient identified, Emergency Drugs available, Suction available, Timeout performed and Patient being monitored Patient Re-evaluated:Patient Re-evaluated prior to induction Oxygen Delivery Method: Nasal Cannula

## 2021-05-11 NOTE — Anesthesia Preprocedure Evaluation (Addendum)
Anesthesia Evaluation  Patient identified by MRN, date of birth, ID band Patient awake    Reviewed: Allergy & Precautions, NPO status , Patient's Chart, lab work & pertinent test results  Airway Mallampati: II  TM Distance: >3 FB Neck ROM: Full    Dental  (+) Teeth Intact   Pulmonary neg pulmonary ROS, former smoker,    Pulmonary exam normal        Cardiovascular hypertension, Pt. on medications and Pt. 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 Bowel sounds: normal.  Peds  Hematology  (+) anemia ,   Anesthesia Other Findings   Reproductive/Obstetrics                           Anesthesia Physical Anesthesia Plan  ASA: 3  Anesthesia Plan: Regional and MAC   Post-op Pain Management:  Regional for Post-op pain   Induction: Intravenous  PONV Risk Score and Plan: 2 and Ondansetron, Dexamethasone, Treatment may vary due to age or medical condition and Propofol infusion  Airway Management Planned: Simple Face Mask, Natural Airway and Nasal Cannula  Additional Equipment: None  Intra-op Plan:   Post-operative Plan:   Informed Consent: I have reviewed the patients History and Physical, chart, labs and discussed the procedure including the risks, benefits and alternatives for the proposed anesthesia with the patient or authorized representative who has indicated his/her understanding and acceptance.     Dental advisory given  Plan Discussed with: CRNA  Anesthesia  Plan Comments: (Lab Results      Component                Value               Date                      WBC                      14.5 (H)            05/11/2021                HGB                      7.9 (L)             05/11/2021                HCT                      24.9 (L)            05/11/2021                MCV                      91.2                05/11/2021  PLT                      515 (H)             05/11/2021           Lab Results      Component                Value               Date                      NA                       139                 05/10/2021                K                        4.2                 05/10/2021                CO2                      23                  05/10/2021                GLUCOSE                  197 (H)             05/10/2021                BUN                      14                  05/10/2021                CREATININE               0.92                05/10/2021                CALCIUM                  8.5 (L)             05/10/2021                GFRNONAA                 >60                 05/10/2021                GFRAA                    75                  07/29/2020          )      Anesthesia Quick Evaluation

## 2021-05-11 NOTE — Anesthesia Procedure Notes (Signed)
Anesthesia Regional Block: Popliteal block   Pre-Anesthetic Checklist: , timeout performed,  Correct Patient, Correct Site, Correct Laterality,  Correct Procedure, Correct Position, site marked,  Risks and benefits discussed,  Surgical consent,  Pre-op evaluation,  At surgeon's request and post-op pain management  Laterality: Left  Prep: Dura Prep       Needles:  Injection technique: Single-shot  Needle Type: Echogenic Stimulator Needle     Needle Length: 10cm  Needle Gauge: 20     Additional Needles:   Procedures:,,,, ultrasound used (permanent image in chart),,    Narrative:  Start time: 05/11/2021 10:37 AM End time: 05/11/2021 10:41 AM Injection made incrementally with aspirations every 5 mL.  Performed by: Personally  Anesthesiologist: Darral Dash, DO  Additional Notes: Patient identified. Risks/Benefits/Options discussed with patient including but not limited to bleeding, infection, nerve damage, failed block, incomplete pain control. Patient expressed understanding and wished to proceed. All questions were answered. Sterile technique was used throughout the entire procedure. Please see nursing notes for vital signs. Aspirated in 5cc intervals with injection for negative confirmation. Patient was given instructions on fall risk and not to get out of bed. All questions and concerns addressed with instructions to call with any issues or inadequate analgesia.

## 2021-05-11 NOTE — Progress Notes (Signed)
PROGRESS NOTE    MISCHELL BRANFORD  JME:[id number] DOB: 05-18-1941 DOA: 04/27/2021 PCP: [name], MD   Brief Narrative: [name] is a 80 y.o. female with a history of hypertension, hyperlipidemia, CAD status post CABG, PAD status post bypass grafting, right transmetatarsal amputation, diabetes mellitus type 2.  Patient presented secondary to left toe wound with associated osteomyelitis.  Patient was initially managed on antibiotics and underwent left fem-pop bypass in addition to third toe amputation.  Now plan for left transmetatarsal amputation.   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 Patient started empirically on Vancomycin and Zosyn. She underwent left common femoral to tibioperoneal trunk bypass and left third toe amputation on 10/8.  Wound cultures (8/10) initially significant for GPCs and she was transitioned to Vancomycin monotherapy, then to Ceftriaxone IV. Culture sensitivities significant for oxacillin/penicillin sensitive Staph aureus/Streptococcus anginosus. Left foot wound with continued purulent drainage -Transition to cefazolin IV possibly discontinue antibiotics depending on results of amputation -Vascular surgery recommendations: Transmetatarsal performed today, incision left opened and packed  Sepsis Present on admission.  Secondary to cellulitis/osteomyelitis.  Antibiotics as mentioned above.  Physiology currently resolved.  Peripheral artery disease Patient has a history of right transmetatarsal amputation in 2020.  Patient is on aspirin and Plavix as  an outpatient in addition to Lipitor.  Plavix discontinued secondary to need for surgical management and patient started on heparin IV. -Continue aspirin, Lipitor, heparin IV with plans to discharge on Xarelto 2.5 mg twice daily  AKI Baseline creatinine of about 1.  Creatinine of 1.41 on admission with a peak creatinine of 1.6.  Currently resolved.  Hyponatremia Mild.  Resolved.  COVID-19 infection Asymptomatic. Incidental. Completed 10 day isolation.  Right pleural effusion History of right pleural effusion from 2021. Also history of grade 1 diastolic heart dysfunction. Asymptomatic. Recent COVID-19 infection. Chest x-ray with right sided pleural effusion. Given lasix IV x1.  CAD s/p CABG Patient is on telmisartan, Lipitor, Plavix, aspirin as an outpatient.  ARB held secondary to AKI.  Plavix held secondary to above.  Diabetes mellitus, type 2 Patient is on glimepiride and Invokana as an outpatient.  Hemoglobin A1c of 7.2%.  Patient started on Levemir 60 units daily and SSI while inpatient. -Continue Levemir and SSI  Chronic iron deficiency anemia Acute on chronic anemia It appears patient received 2 units of PRBC to date in setting of recent left femoral-below knee popliteal artery bypass grafting. Associated iron deficiency. Currently stable. -May give IV iron prior to discharge.   DVT prophylaxis: Heparin IV Code Status:   Code Status: Full Code Family Communication: None at bedside Disposition Plan: Discharge home likely in 2 to 3 days pending vascular surgery recommendations/management   Consultants:  Vascular surgery  Procedures:  LEFT COMMON FEMORAL - TIBIOPERONEAL TRUNK BYPASS (05/03/2021) LEFT THIRD TOE OPEN AMPUTATION (05/03/2021)  Antimicrobials: Vancomycin Cefepime Ceftriaxone Ancef    Subjective: No issues currently. No pain from recent left transmetatarsal amputation.   Objective: Vitals:   05/11/21 1050 05/11/21 1145 05/11/21 1200 05/11/21 1223  BP:  (!) 205/66 (!) 120/46 (!) 154/65  Pulse: 96 76 82   Resp: 20 13 13    Temp:  97.8 F (36.6 C)  97.7 F (36.5 C)  TempSrc:    Oral  SpO2: 99% 96% 98%   Weight:      Height:        Intake/Output Summary (Last 24 hours) at 05/11/2021 1514 Last data filed at 05/11/2021 1430 Gross per 24 hour  Intake 591.01 ml  Output 600 ml  Net -8.99 ml    Filed Weights   04/27/21 1500 05/11/21 0637  Weight: 68.5 kg 76.5 kg    Examination:  General exam: Appears calm and comfortable Respiratory system: Right lower lobe rales/diminished breath sounds. Respiratory effort normal. Cardiovascular system: S1 & S2 heard, RRR. No murmurs, rubs, gallops or clicks. Gastrointestinal system: Abdomen is nondistended, soft and nontender. No organomegaly or masses felt. Normal bowel sounds heard. Central nervous system: Alert and oriented. No focal neurological deficits. Musculoskeletal: LLE ankle edema. No calf tenderness. Left foot in bandage dressing Skin: No cyanosis. No rashes Psychiatry: Judgement and insight appear normal. Mood & affect appropriate.     Data Reviewed: I have personally reviewed following labs and imaging studies  CBC Lab Results  Component Value Date   WBC 14.5 (H) 05/11/2021   RBC 2.73 (L) 05/11/2021   HGB 7.9 (L) 05/11/2021   HCT 24.9 (L) 05/11/2021   MCV 91.2 05/11/2021   MCH 28.9 05/11/2021   PLT 515 (H) 05/11/2021   MCHC 31.7 05/11/2021   RDW 15.8 (H) 05/11/2021   LYMPHSABS 1.2 04/27/2021   MONOABS 1.9 (H) 04/27/2021   EOSABS 0.0 04/27/2021   BASOSABS 0.1 35/70/1779     Last metabolic panel Lab Results  Component Value Date   NA 139 05/10/2021   K 4.2 05/10/2021   CL 111 05/10/2021   CO2 23 05/10/2021   BUN 14 05/10/2021   CREATININE 0.92 05/10/2021   GLUCOSE 197 (H) 05/10/2021   GFRNONAA >60 05/10/2021   GFRAA 75 07/29/2020   CALCIUM 8.5 (L) 05/10/2021   PROT 7.1 08/10/2020   ALBUMIN 4.1 08/10/2020   BILITOT 0.5 08/10/2020   ALKPHOS 62 08/10/2020    AST 25 08/10/2020   ALT 30 08/10/2020   ANIONGAP 5 05/10/2021    CBG (last 3)  Recent Labs    05/11/21 1034 05/11/21 1144 05/11/21 1218  GLUCAP 120* 152* 148*      GFR: Estimated Creatinine Clearance: 50.2 mL/min (by C-G formula based on SCr of 0.92 mg/dL).  Coagulation Profile: No results for input(s): INR, PROTIME in the last 168 hours.  Recent Results (from the past 240 hour(s))  Fungus Culture With Stain     Status: Abnormal (Preliminary result)   Collection Time: 05/03/21  5:42 PM   Specimen: PATH Other; Tissue  Result Value Ref Range Status   Fungus Stain Final report (A)  Final    Comment: (NOTE) Performed At: [facility] [place], [place] [id number] Rush Farmer MD RA:[id number]    Fungus (Mycology) Culture PENDING  Incomplete   Fungal Source WOUND  Final    Comment: LEFT THIRD TOE SWABS FOR CULTURE SPEC A Performed at [facility], [place] [address] [place], [place] [address]   Aerobic/Anaerobic Culture w Gram Stain (surgical/deep wound)     Status: None   Collection Time: 05/03/21  5:42 PM   Specimen: PATH Other; Tissue  Result Value Ref Range Status   Specimen Description WOUND  Final   Special Requests LEFT THIRD TOE SWABS  FOR CULTURE SPEC A  Final   Gram Stain   Final    RARE WBC PRESENT,BOTH PMN AND MONONUCLEAR RARE GRAM POSITIVE COCCI    Culture   Final    FEW STAPHYLOCOCCUS AUREUS FEW STREPTOCOCCUS ANGINOSIS NO ANAEROBES ISOLATED Performed at [facility], [place] [address], [place], [place] [address]    Report Status 05/09/2021 FINAL  Final   Organism ID, Bacteria STAPHYLOCOCCUS AUREUS  Final   Organism ID, Bacteria STREPTOCOCCUS ANGINOSIS  Final      Susceptibility   Staphylococcus aureus - MIC*    CIPROFLOXACIN <=0.5 SENSITIVE Sensitive     ERYTHROMYCIN >=8 RESISTANT Resistant     GENTAMICIN <=0.5 SENSITIVE Sensitive     OXACILLIN 0.5 SENSITIVE Sensitive     TETRACYCLINE <=1 SENSITIVE Sensitive      VANCOMYCIN 1 SENSITIVE Sensitive     TRIMETH/SULFA <=10 SENSITIVE Sensitive     CLINDAMYCIN RESISTANT Resistant     RIFAMPIN <=0.5 SENSITIVE Sensitive     Inducible Clindamycin POSITIVE Resistant     * FEW STAPHYLOCOCCUS AUREUS   Streptococcus anginosis - MIC*    PENICILLIN <=0.06 SENSITIVE Sensitive     CEFTRIAXONE 0.25 SENSITIVE Sensitive     ERYTHROMYCIN <=0.12 SENSITIVE Sensitive     LEVOFLOXACIN 0.5 SENSITIVE Sensitive     VANCOMYCIN 0.5 SENSITIVE Sensitive     * FEW STREPTOCOCCUS ANGINOSIS  Fungus Culture Result     Status: Abnormal   Collection Time: 05/03/21  5:42 PM  Result Value Ref Range Status   Result 1 Comment (A)  Final    Comment: (NOTE) Fungal elements, such as arthroconidia, hyphal fragments, chlamydoconidia, observed. Performed At: [facility] [place], [place] [id number] Rush Farmer MD FG:[id number]          Radiology Studies: DG CHEST PORT 1 VIEW  Result Date: 05/10/2021 CLINICAL DATA:  Decreased right-sided breath sounds EXAM: PORTABLE CHEST 1 VIEW COMPARISON:  Chest x-ray dated April 04, 2020 FINDINGS: Cardiac and mediastinal contours are unchanged post median sternotomy. Small right pleural effusion. Mild bilateral interstitial opacities. No evidence of pneumothorax. IMPRESSION: Small right pleural effusion. Mild bilateral interstitial opacities, possibly due to pulmonary edema. Electronically Signed   By: Yetta Glassman M.D.   On: 05/10/2021 11:56        Scheduled Meds:  ascorbic acid  250 mg Oral Q supper   aspirin EC  81 mg Oral QHS   atorvastatin  80 mg Oral Daily   brimonidine  1 drop Right Eye BID   Chlorhexidine Gluconate Cloth  6 each Topical Daily   docusate sodium  100 mg Oral Daily   insulin aspart  0-15 Units Subcutaneous TID WC   insulin detemir  6 Units Subcutaneous QHS   metoprolol tartrate  37.5 mg Oral BID   pantoprazole  40 mg Oral Once per day on Mon Wed Fri   polyethylene glycol  17 g Oral Daily    pyridoxine  100 mg Oral QPM   saccharomyces boulardii  250 mg Oral BID   sodium chloride flush  3 mL Intravenous Q12H   sodium hypochlorite   Irrigation BID   Continuous Infusions:  sodium chloride      ceFAZolin (ANCEF) IV 2 g (05/11/21 0259)   heparin 1,800 Units/hr (05/11/21 1233)   magnesium sulfate bolus IVPB       LOS: 14 days     Cordelia Poche, MD [facility] 05/11/2021, 3:14 PM  If 7PM-7AM, please contact night-coverage [email or web address]

## 2021-05-11 NOTE — Anesthesia Postprocedure Evaluation (Signed)
Anesthesia Post Note  Patient: [name]  Procedure(s) Performed: LEFT TRANSMETATARSAL AMPUTATION (Left: Foot)     Patient location during evaluation: PACU Anesthesia Type: Regional and MAC Level of consciousness: awake and alert Pain management: pain level controlled Vital Signs Assessment: post-procedure vital signs reviewed and stable Respiratory status: spontaneous breathing, nonlabored ventilation, respiratory function stable and patient connected to nasal cannula oxygen Cardiovascular status: stable and blood pressure returned to baseline Postop Assessment: no apparent nausea or vomiting Anesthetic complications: no   No notable events documented.  Last Vitals:  Vitals:   05/11/21 1145 05/11/21 1200  BP: (!) 120/46 (!) 154/65  Pulse: 76 82  Resp: 13 13  Temp: 36.6 C   SpO2: 96% 98%    Last Pain:  Vitals:   05/11/21 1145  TempSrc:   PainSc: 0-No pain                 Belenda Cruise P Akeema Broder

## 2021-05-11 NOTE — Anesthesia Procedure Notes (Signed)
Anesthesia Regional Block: Adductor canal block   Pre-Anesthetic Checklist: , timeout performed,  Correct Patient, Correct Site, Correct Laterality,  Correct Procedure, Correct Position, site marked,  Risks and benefits discussed,  Surgical consent,  Pre-op evaluation,  At surgeon's request and post-op pain management  Laterality: Left  Prep: Dura Prep       Needles:  Injection technique: Single-shot  Needle Type: Echogenic Stimulator Needle     Needle Length: 10cm  Needle Gauge: 20     Additional Needles:   Procedures:,,,, ultrasound used (permanent image in chart),,    Narrative:  Start time: 05/11/2021 10:32 AM End time: 05/11/2021 10:36 AM Injection made incrementally with aspirations every 5 mL.  Performed by: Personally  Anesthesiologist: Darral Dash, DO  Additional Notes: Patient identified. Risks/Benefits/Options discussed with patient including but not limited to bleeding, infection, nerve damage, failed block, incomplete pain control. Patient expressed understanding and wished to proceed. All questions were answered. Sterile technique was used throughout the entire procedure. Please see nursing notes for vital signs. Aspirated in 5cc intervals with injection for negative confirmation. Patient was given instructions on fall risk and not to get out of bed. All questions and concerns addressed with instructions to call with any issues or inadequate analgesia.

## 2021-05-11 NOTE — Transfer of Care (Signed)
Immediate Anesthesia Transfer of Care Note  Patient: [name]  Procedure(s) Performed: LEFT TRANSMETATARSAL AMPUTATION (Left: Foot)  Patient Location: PACU  Anesthesia Type:MAC and Regional  Level of Consciousness: awake, alert  and oriented  Airway & Oxygen Therapy: Patient Spontanous Breathing and Patient connected to nasal cannula oxygen  Post-op Assessment: Report given to RN and Post -op Vital signs reviewed and stable  Post vital signs: Reviewed and stable  Last Vitals:  Vitals Value Taken Time  BP 120/46 05/11/21 1143  Temp    Pulse 76 05/11/21 1145  Resp 13 05/11/21 1145  SpO2 96 % 05/11/21 1145  Vitals shown include unvalidated device data.  Last Pain:  Vitals:   05/11/21 1030  TempSrc: Oral  PainSc:       Patients Stated Pain Goal: 0 (82/99/37 1696)  Complications: No notable events documented.

## 2021-05-11 NOTE — Op Note (Signed)
    Patient name: [name] MRN: [id number] DOB: May 11, 1941 Sex: female  05/11/2021 Pre-operative Diagnosis: Necrotic left third toe amputation site Post-operative diagnosis:  Same Surgeon:  Eda Paschal. Donzetta Matters, MD Procedure Performed: Open left transmetatarsal amputation  Indications: 80 year old female has undergone left lower extremity revascularization and left third toe amputation site which is failed to heal.  She is now indicated for transmetatarsal amputation.  Findings: The toes were all removed at the transmetatarsal level.  Unfortunately there was minimal capillary bleeding and the wound was packed open.  There was murky fluid that was all drained and the wound was thoroughly irrigated.   Procedure:  The patient was identified in the holding area and taken to the operating where she is placed supine operative table and MAC anesthesia was induced.  She was sterilely prepped draped in the left foot and ankle in usual fashion, antibiotics were administered and timeout was called.  A preoperative block was placed.  This was checked was noted to be intact.  A standard fishmouth type incision was made for transmetatarsal amputation.  We dissected sharply down to the bone with 10 blade.  The bones were transected with oscillating saw.  The toes were all removed en bloc with 10 blade.  There was minimal bleeding this was controlled with cautery.  The wound was thoroughly irrigated and all murky fluid removed.  The bones were smoothed with rasp.  I elected to pack the wound open with Betadine soaked Kerlix and a sterile dressing was placed.  She was awakened from anesthesia having tolerated well any complication.  All counts were correct at completion.  EBL: 10 cc    Daquawn Seelman C. Donzetta Matters, MD [facility] Office: [phone] Pager: [phone]

## 2021-05-11 NOTE — Progress Notes (Signed)
Patient arrived on unit alert and oriented. Vitals taken and heparin restarted per Dr. [name].  Left foot dressing clean, dry, intact.

## 2021-05-12 ENCOUNTER — Encounter (HOSPITAL_COMMUNITY): Payer: Self-pay | Admitting: Vascular Surgery

## 2021-05-12 LAB — CBC
HCT: 26.7 % — ABNORMAL LOW (ref 36.0–46.0)
Hemoglobin: 8.2 g/dL — ABNORMAL LOW (ref 12.0–15.0)
MCH: 28.7 pg (ref 26.0–34.0)
MCHC: 30.7 g/dL (ref 30.0–36.0)
MCV: 93.4 fL (ref 80.0–100.0)
Platelets: 584 10*3/uL — ABNORMAL HIGH (ref 150–400)
RBC: 2.86 MIL/uL — ABNORMAL LOW (ref 3.87–5.11)
RDW: 16.3 % — ABNORMAL HIGH (ref 11.5–15.5)
WBC: 16.1 10*3/uL — ABNORMAL HIGH (ref 4.0–10.5)
nRBC: 0.3 % — ABNORMAL HIGH (ref 0.0–0.2)

## 2021-05-12 LAB — GLUCOSE, CAPILLARY
Glucose-Capillary: 175 mg/dL — ABNORMAL HIGH (ref 70–99)
Glucose-Capillary: 158 mg/dL — ABNORMAL HIGH (ref 70–99)
Glucose-Capillary: 147 mg/dL — ABNORMAL HIGH (ref 70–99)
Glucose-Capillary: 186 mg/dL — ABNORMAL HIGH (ref 70–99)

## 2021-05-12 LAB — HEPARIN LEVEL (UNFRACTIONATED): Heparin Unfractionated: 0.47 IU/mL (ref 0.30–0.70)

## 2021-05-12 NOTE — Care Management Important Message (Signed)
Important Message  Patient Details  Name: [name] MRN: [id number] Date of Birth: 29-May-1941   Medicare Important Message Given:  Yes     Shelda Altes 05/12/2021, 9:10 AM

## 2021-05-12 NOTE — Evaluation (Addendum)
Physical Therapy Re-Evaluation Patient Details Name: [name] MRN: [id number] DOB: 11-28-1940 Today's Date: 05/12/2021   History of Present Illness  80 y.o. 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 8/5 abdominal aortogram with BLE runoff. 8/10 3rd toe amputation and fem-pop bypass. 8/11 COVID (+). 8/18 L transmet amputation PMH: multiple LE vascular surgeries, rt transmetatarsal amputation, HTN, CABG, DM, PVD, diabetic neuropathy   Clinical Impression  Pt tolerated treatment well with VSS throughout. Pt requires similar level of assist with transfers to previous sessions. Pt ambulated in room and was educated on how to ambulate with darco and maintain weight bearing on heel. Pt verbalized understanding and needs reinforcement. Goals remain appropriate. Pt would benefit from continued PT to address balance, mobility, and functional deficits. Recommend SNF upon d/c with supervision for mobility, as pt has intermittent support at home. Will follow acutely.    Follow Up Recommendations SNF;Supervision for mobility/OOB    Equipment Recommendations  None recommended by PT    Recommendations for Other Services       Precautions / Restrictions Precautions Precautions: Fall Required Braces or Orthoses: Other Brace Other Brace: Darco shoe on L and orthopedic shoe on R Restrictions Weight Bearing Restrictions: No Other Position/Activity Restrictions: weight bearing on heel      Mobility  Bed Mobility               General bed mobility comments: Pt seated EOB upon arrival    Transfers Overall transfer level: Needs assistance Equipment used: Rolling walker (2 wheeled) Transfers: Sit to/from Stand   Stand pivot transfers: Min assist       General transfer comment: Sit to stand x2 with min A to power up to standing. 1st trial pt felt unsteady in standing with Darco and collapsed back to seated position. 2nd trial with slow  and steady pace. Verbal cues provided for hand placement  Ambulation/Gait Ambulation/Gait assistance: Min guard Gait Distance (Feet): 35 Feet Assistive device: Rolling walker (2 wheeled) Gait Pattern/deviations: Step-to pattern;Antalgic Gait velocity: Decreased Gait velocity interpretation: 1.31 - 2.62 ft/sec, indicative of limited community ambulator General Gait Details: Pt cued and educated on how to walk with darco and maintaining precautions on L heel. Min guard for safety.  Stairs            Wheelchair Mobility    Modified Rankin (Stroke Patients Only)       Balance Overall balance assessment: Needs assistance   Sitting balance-Leahy Scale: Normal     Standing balance support: Bilateral upper extremity supported;During functional activity Standing balance-Leahy Scale: Poor Standing balance comment: BUE support on RW                             Pertinent Vitals/Pain Pain Score: 7  Pain Location: L foot Pain Descriptors / Indicators: Discomfort Pain Intervention(s): Monitored during session    Home Living Family/patient expects to be discharged to:: Private residence Living Arrangements: Children Available Help at Discharge: Family;Available PRN/intermittently (daughter works) Type of Home: House Home Access: Stairs to enter   Technical brewer of Steps: 1 Home Layout: One level Home Equipment: Environmental consultant - 2 wheels;Bedside commode;Grab bars - tub/shower;Cane - single point      Prior Function Level of Independence: Independent with assistive device(s)         Comments: does not use AD typically at baseline but has been using a cane recently. Pt reports driving,  doing ADLs, IADLs, etc. Pt reports wearing darco shoe to R foot for almost 2 years     Hand Dominance   Dominant Hand: Right    Extremity/Trunk Assessment   Upper Extremity Assessment Upper Extremity Assessment: Defer to OT evaluation    Lower Extremity  Assessment Lower Extremity Assessment: Generalized weakness    Cervical / Trunk Assessment Cervical / Trunk Assessment: Kyphotic  Communication   Communication: No difficulties  Cognition Arousal/Alertness: Awake/alert Behavior During Therapy: WFL for tasks assessed/performed Overall Cognitive Status: Within Functional Limits for tasks assessed                                        General Comments General comments (skin integrity, edema, etc.): VSS on RA    Exercises General Exercises - Lower Extremity Ankle Circles/Pumps: AROM;Both;10 reps;Seated Long Arc Quad: AROM;Both;15 reps;Seated   Assessment/Plan    PT Assessment Patient needs continued PT services  PT Problem List Pain;Decreased strength;Decreased balance;Decreased mobility;Decreased activity tolerance       PT Treatment Interventions Gait training;Therapeutic exercise;Therapeutic activities;Functional mobility training;DME instruction;Balance training;Patient/family education    PT Goals (Current goals can be found in the Care Plan section)  Acute Rehab PT Goals Patient Stated Goal: Improve LLE pain PT Goal Formulation: With patient Time For Goal Achievement: 05/18/21 Potential to Achieve Goals: Good    Frequency Min 3X/week   Barriers to discharge Decreased caregiver support      Co-evaluation               AM-PAC PT "6 Clicks" Mobility  Outcome Measure Help needed turning from your back to your side while in a flat bed without using bedrails?: None Help needed moving from lying on your back to sitting on the side of a flat bed without using bedrails?: A Little Help needed moving to and from a bed to a chair (including a wheelchair)?: A Little Help needed standing up from a chair using your arms (e.g., wheelchair or bedside chair)?: A Little Help needed to walk in hospital room?: A Little Help needed climbing 3-5 steps with a railing? : A Lot 6 Click Score: 18    End of  Session Equipment Utilized During Treatment: Gait belt Activity Tolerance: Patient tolerated treatment well;Patient limited by pain Patient left: in bed;with call bell/phone within reach Nurse Communication: Mobility status PT Visit Diagnosis: Unsteadiness on feet (R26.81);Other abnormalities of gait and mobility (R26.89);Muscle weakness (generalized) (M62.81);Difficulty in walking, not elsewhere classified (R26.2)    Time: 2956-2130 PT Time Calculation (min) (ACUTE ONLY): 23 min   Charges:   PT Evaluation $PT Re-evaluation: 1 Re-eval PT Treatments $Gait Training: 8-22 mins        Louie Casa, SPT Acute Rehab: [phone]    Domingo Dimes 05/12/2021, 4:10 PM

## 2021-05-12 NOTE — Progress Notes (Signed)
ANTICOAGULATION CONSULT NOTE  Pharmacy Consult:  Heparin + Ancef Indication: graft patency s/p L common femoral-tibioperoneal trunk bypass + toe infection  Allergies  Allergen Reactions   Bactrim [Sulfamethoxazole-Trimethoprim] Other (See Comments)    ^ K+( elevated)    Demerol [Meperidine]     Delusional    Scopolamine     Delusional    Tramadol Other (See Comments)    Keeps awake   Versed [Midazolam] Anxiety    Frantic, out of my mind, agitated     Patient Measurements: Height: 5' 4.5" (163.8 cm) Weight: 77.3 kg (170 lb 6.4 oz) IBW/kg (Calculated) : 55.85 Heparin Dosing Weight: 68kg  Vital Signs: Temp: 98 F (36.7 C) (08/19 1200) Temp Source: Oral (08/19 1200) BP: 148/55 (08/19 1200) Pulse Rate: 76 (08/19 1200)  Labs: Recent Labs    05/10/21 0110 05/11/21 0236 05/12/21 0228  HGB 7.9* 7.9* 8.2*  HCT 25.0* 24.9* 26.7*  PLT 513* 515* 584*  HEPARINUNFRC 0.50 0.47 0.47  CREATININE 0.92  --   --      Estimated Creatinine Clearance: 50.5 mL/min (by C-G formula based on SCr of 0.92 mg/dL).    Assessment: 80 y.o. female admitted with osteo and grangrene of toe now s/p left common femoral bypass and toe amputation 8/10.  Pharmacy consulted for therapeutic heparin dosing to promote graft patency. Heparin level therapeutic.  Pharmacy consulted to narrow from Rocephin to Ancef for MSSA and Strep anginosis toe infection.  Renal function improving, afebrile, WBC trending down.    Patient now POD1. Per discussion with VVS team, patient may need additional procedure in near future and will continue heparin gtt for the time being.   Per chart review, amputation site has active bloody drainage venous appearing, there does appear to be skin edge bleeding as well. H/h stable but due to concerns for bleeding will aim for lower end of therapeutic heparin goal for now.  Goal of Therapy:  Heparin level 0.3-0.5 units/ml Monitor platelets by anticoagulation protocol:  Yes Resolution of infection   Plan:  Continue heparin gtt at 1800 units/hr Daily heparin level and CBC   Ancef 2gm IV Q8H   Thank you for allowing pharmacy to be a part of this patient's care.  Ardyth Harps, PharmD Clinical Pharmacist

## 2021-05-12 NOTE — Progress Notes (Signed)
PROGRESS NOTE    [name]  PPJ:[id number] DOB: [name], 1941 DOA: 04/27/2021 PCP: [name], MD   Brief Narrative: [name] is a 80 y.o. female with a history of hypertension, hyperlipidemia, CAD status post CABG, PAD status post bypass grafting, right transmetatarsal amputation, diabetes mellitus type 2.  Patient presented secondary to left toe wound with associated osteomyelitis.  Patient was initially managed on antibiotics and underwent left fem-pop bypass in addition to third toe amputation.  Now plan for left transmetatarsal amputation.   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 Patient started empirically on Vancomycin and Zosyn. She underwent left common femoral to tibioperoneal trunk bypass and left third toe amputation on 10/8.  Wound cultures (8/10) initially significant for GPCs and she was transitioned to Vancomycin monotherapy, then to Ceftriaxone IV. Culture sensitivities significant for oxacillin/penicillin sensitive Staph aureus/Streptococcus anginosus. Left foot wound with continued purulent drainage and patient underwent left transmetatarsal amputation on 8/18. Stable WBC. Afebrile. -Continue Cefazolin IV -Vascular surgery recommendations: watch over the weekend, possible wound vac  Sepsis Present on admission.  Secondary to cellulitis/osteomyelitis.  Antibiotics as mentioned above.  Physiology currently resolved.  Peripheral artery disease Patient has a history of right transmetatarsal amputation in 2020.  Patient is on aspirin and Plavix as an  outpatient in addition to Lipitor.  Plavix discontinued secondary to need for surgical management and patient started on heparin IV. -Continue aspirin, Lipitor, heparin IV with plans to discharge on Xarelto 2.5 mg twice daily  AKI Baseline creatinine of about 1.  Creatinine of 1.41 on admission with a peak creatinine of 1.6.  Currently resolved.  Hyponatremia Mild.  Resolved.  COVID-19 infection Asymptomatic. Incidental. Completed 10 day isolation.  Right pleural effusion History of right pleural effusion from 2021. Also history of grade 1 diastolic heart dysfunction. Asymptomatic. Recent COVID-19 infection. Chest x-ray with right sided pleural effusion. Given lasix IV x1.  CAD s/p CABG Patient is on telmisartan, Lipitor, Plavix, aspirin as an outpatient.  ARB held secondary to AKI.  Plavix held secondary to above.  Diabetes mellitus, type 2 Patient is on glimepiride and Invokana as an outpatient.  Hemoglobin A1c of 7.2%.  Patient started on Levemir 60 units daily and SSI while inpatient. -Continue Levemir and SSI  Chronic iron deficiency anemia Acute on chronic anemia It appears patient received 2 units of PRBC to date in setting of recent left femoral-below knee popliteal artery bypass grafting. Associated iron deficiency. Currently stable. -May give IV iron prior to discharge.   DVT prophylaxis: Heparin IV Code Status:   Code Status: Full Code Family Communication: None at bedside Disposition Plan: Discharge home likely in 3+ days pending vascular surgery recommendations/management   Consultants:  Vascular surgery  Procedures:  LEFT COMMON FEMORAL - TIBIOPERONEAL TRUNK BYPASS (05/03/2021) LEFT THIRD TOE OPEN AMPUTATION (05/03/2021)  Antimicrobials: Vancomycin Cefepime Ceftriaxone Ancef    Subjective: Happy that she will be referred to the wound clinic for consideration of hyperbaric wound treatment. No issues overnight. No dyspnea or chest  pain.  Objective: Vitals:   05/11/21 2124 05/11/21 2331 05/12/21  0325 05/12/21 0621  BP: (!) 182/58 (!) 131/49 (!) 151/65   Pulse: 100 85 86   Resp: 16 15 16    Temp: 98.2 F (36.8 C) 99 F (37.2 C) 98.3 F (36.8 C)   TempSrc: Oral Oral Oral   SpO2:  95% 94%   Weight:    77.3 kg  Height:        Intake/Output Summary (Last 24 hours) at 05/12/2021 0935 Last data filed at 05/12/2021 1245 Gross per 24 hour  Intake 436 ml  Output 650 ml  Net -214 ml    Filed Weights   04/27/21 1500 05/11/21 0637 05/12/21 0621  Weight: 68.5 kg 76.5 kg 77.3 kg    Examination:  General exam: Appears calm and comfortable Respiratory system: RLL rales. Respiratory effort normal. Cardiovascular system: S1 & S2 heard, RRR. No murmurs, rubs, gallops or clicks. Gastrointestinal system: Abdomen is nondistended, soft and nontender. No organomegaly or masses felt. Normal bowel sounds heard. Central nervous system: Alert and oriented. No focal neurological deficits. Musculoskeletal: LLE 1+ pitting edema. No calf tenderness. Left foot in bandage dressing Skin: No cyanosis. No rashes Psychiatry: Judgement and insight appear normal. Mood & affect appropriate.     Data Reviewed: I have personally reviewed following labs and imaging studies  CBC Lab Results  Component Value Date   WBC 16.1 (H) 05/12/2021   RBC 2.86 (L) 05/12/2021   HGB 8.2 (L) 05/12/2021   HCT 26.7 (L) 05/12/2021   MCV 93.4 05/12/2021   MCH 28.7 05/12/2021   PLT 584 (H) 05/12/2021   MCHC 30.7 05/12/2021   RDW 16.3 (H) 05/12/2021   LYMPHSABS 1.2 04/27/2021   MONOABS 1.9 (H) 04/27/2021   EOSABS 0.0 04/27/2021   BASOSABS 0.1 80/99/8338     Last metabolic panel Lab Results  Component Value Date   NA 139 05/10/2021   K 4.2 05/10/2021   CL 111 05/10/2021   CO2 23 05/10/2021   BUN 14 05/10/2021   CREATININE 0.92 05/10/2021   GLUCOSE 197 (H) 05/10/2021   GFRNONAA >60 05/10/2021   GFRAA 75 07/29/2020   CALCIUM 8.5 (L)  05/10/2021   PROT 7.1 08/10/2020   ALBUMIN 4.1 08/10/2020   BILITOT 0.5 08/10/2020   ALKPHOS 62 08/10/2020   AST 25 08/10/2020   ALT 30 08/10/2020   ANIONGAP 5 05/10/2021    CBG (last 3)  Recent Labs    05/11/21 1653 05/11/21 2112 05/12/21 0617  GLUCAP 184* 198* 175*      GFR: Estimated Creatinine Clearance: 50.5 mL/min (by C-G formula based on SCr of 0.92 mg/dL).  Coagulation Profile: No results for input(s): INR, PROTIME in the last 168 hours.  Recent Results (from the past 240 hour(s))  Fungus Culture With Stain     Status: Abnormal (Preliminary result)   Collection Time: 05/03/21  5:42 PM   Specimen: PATH Other; Tissue  Result Value Ref Range Status   Fungus Stain Final report (A)  Final    Comment: (NOTE) Performed At: [facility] [place], [place] [id number] Rush Farmer MD [id number]    Fungus (Mycology) Culture PENDING  Incomplete   Fungal Source WOUND  Final    Comment: LEFT THIRD TOE SWABS FOR CULTURE SPEC A Performed at [facility], [place] [address], [place], [place] [address]   Aerobic/Anaerobic Culture w Gram Stain (surgical/deep wound)     Status: None   Collection Time: 05/03/21  5:42 PM   Specimen: PATH Other; Tissue  Result Value  Ref Range Status   Specimen Description WOUND  Final   Special Requests LEFT THIRD TOE SWABS FOR CULTURE SPEC A  Final   Gram Stain   Final    RARE WBC PRESENT,BOTH PMN AND MONONUCLEAR RARE GRAM POSITIVE COCCI    Culture   Final    FEW STAPHYLOCOCCUS AUREUS FEW STREPTOCOCCUS ANGINOSIS NO ANAEROBES ISOLATED Performed at [facility], [place] [address] [place], [place] [address]    Report Status 05/09/2021 FINAL  Final   Organism ID, Bacteria STAPHYLOCOCCUS AUREUS  Final   Organism ID, Bacteria STREPTOCOCCUS ANGINOSIS  Final      Susceptibility   Staphylococcus aureus - MIC*    CIPROFLOXACIN <=0.5 SENSITIVE Sensitive     ERYTHROMYCIN >=8 RESISTANT Resistant     GENTAMICIN  <=0.5 SENSITIVE Sensitive     OXACILLIN 0.5 SENSITIVE Sensitive     TETRACYCLINE <=1 SENSITIVE Sensitive     VANCOMYCIN 1 SENSITIVE Sensitive     TRIMETH/SULFA <=10 SENSITIVE Sensitive     CLINDAMYCIN RESISTANT Resistant     RIFAMPIN <=0.5 SENSITIVE Sensitive     Inducible Clindamycin POSITIVE Resistant     * FEW STAPHYLOCOCCUS AUREUS   Streptococcus anginosis - MIC*    PENICILLIN <=0.06 SENSITIVE Sensitive     CEFTRIAXONE 0.25 SENSITIVE Sensitive     ERYTHROMYCIN <=0.12 SENSITIVE Sensitive     LEVOFLOXACIN 0.5 SENSITIVE Sensitive     VANCOMYCIN 0.5 SENSITIVE Sensitive     * FEW STREPTOCOCCUS ANGINOSIS  Fungus Culture Result     Status: Abnormal   Collection Time: 05/03/21  5:42 PM  Result Value Ref Range Status   Result 1 Comment (A)  Final    Comment: (NOTE) Fungal elements, such as arthroconidia, hyphal fragments, chlamydoconidia, observed. Performed At: [facility] [place], [place] [id number] Rush Farmer MD [id number]          Radiology Studies: No results found.      Scheduled Meds:  ascorbic acid  250 mg Oral Q supper   aspirin EC  81 mg Oral QHS   atorvastatin  80 mg Oral Daily   brimonidine  1 drop Right Eye BID   Chlorhexidine Gluconate Cloth  6 each Topical Daily   docusate sodium  100 mg Oral Daily   insulin aspart  0-15 Units Subcutaneous TID WC   insulin detemir  6 Units Subcutaneous QHS   metoprolol tartrate  37.5 mg Oral BID   pantoprazole  40 mg Oral Once per day on Mon Wed Fri   polyethylene glycol  17 g Oral Daily   pyridoxine  100 mg Oral QPM   saccharomyces boulardii  250 mg Oral BID   sodium chloride flush  3 mL Intravenous Q12H   Continuous Infusions:  sodium chloride      ceFAZolin (ANCEF) IV 2 g (05/12/21 0125)   heparin 1,800 Units/hr (05/12/21 0323)   magnesium sulfate bolus IVPB       LOS: 15 days     Cordelia Poche, MD [facility] 05/12/2021, 9:35 AM  If 7PM-7AM, please contact  night-coverage [email or web address]

## 2021-05-12 NOTE — Progress Notes (Addendum)
[facility] [place]  Subjective  - No issues, she wants to do everything possible such as hyperbaric chamber if possible.   Objective (!) 151/65 86 98.3 F (36.8 C) (Oral) 16 94%  Intake/Output Summary (Last 24 hours) at 05/12/2021 0723 Last data filed at 05/12/2021 7622 Gross per 24 hour  Intake 436 ml  Output 650 ml  Net -214 ml    Active bloody drainage venous appearing, there does appear to be skin edge bleeding as well.  Wet to dry dressing reapplied. Heart RRR Lungs non labored breathing Left groin soft, leg incision healing well   Assessment/Planning: 80 y.o. female is s/p ) left common femoral - tibioperoneal trunk bypass with 7mm ringed ePTFE in anatomic tunnel and left third toe open amputation on 05/03/2021 by Dr. Stanford Breed 8 Days Post-Op  POD # 1 TMA left LE  Pending observation and healing of the TMA over the weekend Continue wet to dry dressing changes daily. CBC daily for blood loss.  HGB stable at 8.2 this am Leukocytosis trending up 16.1. On vancomycin DVT prophylaxis: Heparin gtt Pharmacy consulted for therapeutic heparin to promote graft patency. Per VVS, planning eventual transition to [place].    Roxy Horseman 05/12/2021 7:23 AM --  Laboratory Lab Results: Recent Labs    05/11/21 0236 05/12/21 0228  WBC 14.5* 16.1*  HGB 7.9* 8.2*  HCT 24.9* 26.7*  PLT 515* 584*   BMET Recent Labs    05/10/21 0110  NA 139  K 4.2  CL 111  CO2 23  GLUCOSE 197*  BUN 14  CREATININE 0.92  CALCIUM 8.5*    COAG Lab Results  Component Value Date   INR 1.1 04/27/2021   INR 1.0 08/10/2020   INR 1.8 (H) 03/24/2020   No results found for: PTT  VASCULAR STAFF ADDENDUM: I have independently interviewed and examined the patient. I agree with the above.  Doing well s/p open TMA.  High risk for limb loss. Patient strongly desires limb salvage. Continue wet-to-dry dressings over the weekend. Will transition to [facility] on Monday  if wound is cleaned up. Plan for DC with outpatient follow up with me and wound center for hyperbaric oxygen.  [name]. Stanford Breed, MD [facility] Phone Number: [phone] 05/12/2021 2:30 PM

## 2021-05-13 LAB — HEPARIN LEVEL (UNFRACTIONATED): Heparin Unfractionated: 0.55 IU/mL (ref 0.30–0.70)

## 2021-05-13 LAB — CBC
HCT: 24.8 % — ABNORMAL LOW (ref 36.0–46.0)
Hemoglobin: 7.8 g/dL — ABNORMAL LOW (ref 12.0–15.0)
MCH: 29.1 pg (ref 26.0–34.0)
MCHC: 31.5 g/dL (ref 30.0–36.0)
MCV: 92.5 fL (ref 80.0–100.0)
Platelets: 432 10*3/uL — ABNORMAL HIGH (ref 150–400)
RBC: 2.68 MIL/uL — ABNORMAL LOW (ref 3.87–5.11)
RDW: 16.8 % — ABNORMAL HIGH (ref 11.5–15.5)
WBC: 15.1 10*3/uL — ABNORMAL HIGH (ref 4.0–10.5)
nRBC: 0.2 % (ref 0.0–0.2)

## 2021-05-13 LAB — GLUCOSE, CAPILLARY
Glucose-Capillary: 163 mg/dL — ABNORMAL HIGH (ref 70–99)
Glucose-Capillary: 176 mg/dL — ABNORMAL HIGH (ref 70–99)
Glucose-Capillary: 170 mg/dL — ABNORMAL HIGH (ref 70–99)
Glucose-Capillary: 204 mg/dL — ABNORMAL HIGH (ref 70–99)

## 2021-05-13 LAB — BASIC METABOLIC PANEL
Anion gap: 9 (ref 5–15)
BUN: 11 mg/dL (ref 8–23)
CO2: 20 mmol/L — ABNORMAL LOW (ref 22–32)
Calcium: 8.8 mg/dL — ABNORMAL LOW (ref 8.9–10.3)
Chloride: 108 mmol/L (ref 98–111)
Creatinine, Ser: 0.75 mg/dL (ref 0.44–1.00)
GFR, Estimated: >60 mL/min (ref >60)
Glucose, Bld: 134 mg/dL — ABNORMAL HIGH (ref 70–99)
Potassium: 3.4 mmol/L — ABNORMAL LOW (ref 3.5–5.1)
Sodium: 137 mmol/L (ref 135–145)

## 2021-05-13 MED ORDER — HYDRALAZINE HCL 20 MG/ML IJ SOLN
5.0000 mg | INTRAMUSCULAR | Status: AC | PRN
Start: 2021-05-13 — End: 2021-05-13
  Administered 2021-05-13 (×2): 5 mg via INTRAVENOUS
  Filled 2021-05-13 (×2): qty 1

## 2021-05-13 MED ORDER — IRBESARTAN 300 MG PO TABS
300.0000 mg | ORAL_TABLET | Freq: Every day | ORAL | Status: DC
  Administered 2021-05-13 – 2021-05-15 (×3): 300 mg via ORAL
  Filled 2021-05-13 (×3): qty 1

## 2021-05-13 MED ORDER — HYDROCHLOROTHIAZIDE 25 MG PO TABS
12.5000 mg | ORAL_TABLET | Freq: Every day | ORAL | Status: DC
  Administered 2021-05-13 – 2021-05-15 (×3): 12.5 mg via ORAL
  Filled 2021-05-13 (×3): qty 1

## 2021-05-13 MED ORDER — LABETALOL HCL 5 MG/ML IV SOLN
10.0000 mg | Freq: Once | INTRAVENOUS | Status: AC
  Administered 2021-05-13: 10 mg via INTRAVENOUS
  Filled 2021-05-13: qty 4

## 2021-05-13 MED ORDER — LABETALOL HCL 5 MG/ML IV SOLN
10.0000 mg | Freq: Once | INTRAVENOUS | Status: DC
  Filled 2021-05-13: qty 4

## 2021-05-13 NOTE — Evaluation (Signed)
Occupational Therapy Re-Evaluation Patient Details Name: [name] MRN: [id number] DOB: December 19, 1940 Today's Date: 05/13/2021    History of Present Illness 80 y.o. 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 8/5 abdominal aortogram with BLE runoff. 8/10 3rd toe amputation and fem-pop bypass. 8/11 COVID (+). 8/18 L transmet amputation PMH: multiple LE vascular surgeries, rt transmetatarsal amputation, HTN, CABG, DM, PVD, diabetic neuropathy   Clinical Impression   Pt ordered for OT re-evaluation as she is s/p L transmet amputation 8/18. Pt continues to require supervision for functional mobility at RW level for in room mobility. She required minA to don/doff L darco shoe, supervision while standing at sink to complete grooming and minA to powerup into standing. Overall, pt with good adherence to weight bearing status. Continue to recommend HHOT at discharge. Will continue to follow acutely.     Follow Up Recommendations  Home health OT;Supervision - Intermittent    Equipment Recommendations  None recommended by OT    Recommendations for Other Services       Precautions / Restrictions Precautions Precautions: Fall Required Braces or Orthoses: Other Brace Other Brace: Darco shoe on L and orthopedic shoe on R Restrictions Weight Bearing Restrictions: No LLE Weight Bearing: Partial weight bearing Other Position/Activity Restrictions: weight bearing on heel      Mobility Bed Mobility Overal bed mobility: Modified Independent Bed Mobility: Supine to Sit;Sit to Supine     Supine to sit: Modified independent (Device/Increase time);HOB elevated Sit to supine: Modified independent (Device/Increase time)        Transfers Overall transfer level: Needs assistance Equipment used: Rolling walker (2 wheeled) Transfers: Sit to/from Stand Sit to Stand: Min assist Stand pivot transfers: Supervision       General transfer comment:  minA to powerup from bed, pt required slightly elevated surface    Balance Overall balance assessment: Needs assistance Sitting-balance support: Feet supported Sitting balance-Leahy Scale: Normal     Standing balance support: Bilateral upper extremity supported;During functional activity;Single extremity supported Standing balance-Leahy Scale: Poor Standing balance comment: reliant on at least single UE support on RW while grooming at sink               High Level Balance Comments: VSS on RA           ADL either performed or assessed with clinical judgement   ADL Overall ADL's : Needs assistance/impaired Eating/Feeding: Independent;Sitting   Grooming: Supervision/safety;Standing;Applying deodorant Grooming Details (indicate cue type and reason): Standing at sink, maintaining WB status     Lower Body Bathing: Minimal assistance;Sit to/from stand   Upper Body Dressing : Set up;Sitting   Lower Body Dressing: Minimal assistance;Sitting/lateral leans Lower Body Dressing Details (indicate cue type and reason): able to don R darco shoe but required assistance with the L (top strap only). Pt reports L LE swelling and unable to successfully cross LEs at this time. Pt independently problem solved likely able to don L shoe if propped up on a stool as this is what she did with R LE to start with Toilet Transfer: Supervision/safety;Ambulation Toilet Transfer Details (indicate cue type and reason): ambulated to sink and back Toileting- Clothing Manipulation and Hygiene: Minimal assistance;Sit to/from stand Toileting - Clothing Manipulation Details (indicate cue type and reason): simulated     Functional mobility during ADLs: Supervision/safety;Rolling walker General ADL Comments: supervision for safety, pt maintaining WB status throughout session     Vision Baseline Vision/History: Wears glasses Wears Glasses: Reading  only Patient Visual Report: No change from baseline Vision  Assessment?: No apparent visual deficits     Perception     Praxis      Pertinent Vitals/Pain Pain Assessment: 0-10 Pain Score: 5  Pain Location: L foot Pain Descriptors / Indicators: Discomfort Pain Intervention(s): Monitored during session;Limited activity within patient's tolerance     Hand Dominance Right   Extremity/Trunk Assessment Upper Extremity Assessment Upper Extremity Assessment: Overall WFL for tasks assessed   Lower Extremity Assessment Lower Extremity Assessment: Generalized weakness LLE Deficits / Details: limited by pain   Cervical / Trunk Assessment Cervical / Trunk Assessment: Kyphotic   Communication Communication Communication: No difficulties   Cognition Arousal/Alertness: Awake/alert Behavior During Therapy: WFL for tasks assessed/performed Overall Cognitive Status: Within Functional Limits for tasks assessed                                     General Comments       Exercises     Shoulder Instructions      Home Living Family/patient expects to be discharged to:: Private residence Living Arrangements: Children Available Help at Discharge: Family;Available PRN/intermittently (daughter works) Type of Home: House Home Access: Stairs to enter Technical brewer of Steps: 1   Home Layout: One level     Bathroom Shower/Tub: Teacher, early years/pre: Handicapped height     Home Equipment: Environmental consultant - 2 wheels;Bedside commode;Grab bars - tub/shower;Cane - single point          Prior Functioning/Environment Level of Independence: Independent with assistive device(s)        Comments: does not use AD typically at baseline but has been using a cane recently. Pt reports driving, doing ADLs, IADLs, etc. Pt reports wearing darco shoe to R foot for almost 2 years        OT Problem List: Decreased strength;Impaired balance (sitting and/or standing);Decreased activity tolerance      OT Treatment/Interventions:  Self-care/ADL training;Therapeutic exercise;Energy conservation;DME and/or AE instruction;Therapeutic activities;Patient/family education;Balance training    OT Goals(Current goals can be found in the care plan section) Acute Rehab OT Goals Patient Stated Goal: Improve LLE pain OT Goal Formulation: With patient Time For Goal Achievement: 05/18/21 Potential to Achieve Goals: Good  OT Frequency: Min 2X/week   Barriers to D/C:            Co-evaluation              AM-PAC OT "6 Clicks" Daily Activity     Outcome Measure Help from another person eating meals?: None Help from another person taking care of personal grooming?: A Little Help from another person toileting, which includes using toliet, bedpan, or urinal?: A Little Help from another person bathing (including washing, rinsing, drying)?: A Little Help from another person to put on and taking off regular upper body clothing?: None Help from another person to put on and taking off regular lower body clothing?: A Little 6 Click Score: 20   End of Session Equipment Utilized During Treatment: Rolling walker;Gait belt Nurse Communication: Mobility status  Activity Tolerance: Patient tolerated treatment well Patient left: in bed;with call bell/phone within reach  OT Visit Diagnosis: Unsteadiness on feet (R26.81);Other abnormalities of gait and mobility (R26.89)                Time: 5732-2025 OT Time Calculation (min): 18 min Charges:  OT General Charges $OT Visit: 1 Visit OT  Evaluation $OT Re-eval: 1 Re-eval  [facility] OTR/L Acute Rehabilitation Services Office: [place] 05/13/2021, 10:56 AM

## 2021-05-13 NOTE — Progress Notes (Signed)
Physical Therapy Treatment Patient Details Name: [name] MRN: [id number] DOB: Dec 23, 1940 Today's Date: 05/13/2021    History of Present Illness 80 y.o. 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 8/5 abdominal aortogram with BLE runoff. 8/10 3rd toe amputation and fem-pop bypass. 8/11 COVID (+). 8/18 L transmet amputation PMH: multiple LE vascular surgeries, rt transmetatarsal amputation, HTN, CABG, DM, PVD, diabetic neuropathy    PT Comments    The pt was seen for continued OOB mobility progression this afternoon. The pt is eager to continue progressing, but continues to require minA to complete sit-stand transfers due to deficits in LE strength and power. The pt was able to maintain LLE weight bearing through her heel well, but was limited to 35 ft ambulation at this time due to fatigue. Due to motivation and currently needing only minA, recommend we work towards increasing mobility acutely to progress to home with HHPT as pt is close to supervision level.    Follow Up Recommendations  Supervision for mobility/OOB;Home health PT     Equipment Recommendations  None recommended by PT    Recommendations for Other Services       Precautions / Restrictions Precautions Precautions: Fall Required Braces or Orthoses: Other Brace Other Brace: Darco shoe on L and orthopedic shoe on R Restrictions Weight Bearing Restrictions: Yes LLE Weight Bearing: Partial weight bearing LLE Partial Weight Bearing Percentage or Pounds: through heel only in darco shoe Other Position/Activity Restrictions: weight bearing on heel    Mobility  Bed Mobility Overal bed mobility: Modified Independent Bed Mobility: Supine to Sit     Supine to sit: Modified independent (Device/Increase time);HOB elevated Sit to supine: Modified independent (Device/Increase time)   General bed mobility comments: increased time, no physical assist given     Transfers Overall transfer level: Needs assistance Equipment used: Rolling walker (2 wheeled) Transfers: Sit to/from Stand Sit to Stand: Min assist;From elevated surface Stand pivot transfers: Supervision       General transfer comment: pt unable to generate hip clearance from low surface, minA to support during increased time to power up to standing  Ambulation/Gait Ambulation/Gait assistance: Min guard Gait Distance (Feet): 35 Feet Assistive device: Rolling walker (2 wheeled) Gait Pattern/deviations: Step-to pattern;Decreased stance time - left;Decreased stride length;Decreased weight shift to left;Antalgic Gait velocity: Decreased   General Gait Details: pt with LLE remaining in front, step-to pattern to maintain heel-only WB on LLE. no LOB, but pt reports fatigue folowing 20 ft ambulaiton in room. VSS      Balance Overall balance assessment: Needs assistance Sitting-balance support: Feet supported Sitting balance-Leahy Scale: Normal     Standing balance support: Bilateral upper extremity supported;During functional activity;Single extremity supported Standing balance-Leahy Scale: Poor Standing balance comment: reliant on at least single UE, BUE for gait               High Level Balance Comments: VSS on RA            Cognition Arousal/Alertness: Awake/alert Behavior During Therapy: WFL for tasks assessed/performed Overall Cognitive Status: Within Functional Limits for tasks assessed                                 General Comments: pt tearful through session, states she is feeling sorry for herself given the situation, but is easily distracted  Pertinent Vitals/Pain Pain Assessment: Faces Pain Score: 5  Faces Pain Scale: Hurts a little bit Pain Location: L foot Pain Descriptors / Indicators: Discomfort Pain Intervention(s): Limited activity within patient's tolerance;Monitored during session;Repositioned    Home Living  Family/patient expects to be discharged to:: Private residence Living Arrangements: Children Available Help at Discharge: Family;Available PRN/intermittently (daughter works) Type of Home: House Home Access: Stairs to enter   Home Layout: One level Home Equipment: Environmental consultant - 2 wheels;Bedside commode;Grab bars - tub/shower;Cane - single point      Prior Function Level of Independence: Independent with assistive device(s)      Comments: does not use AD typically at baseline but has been using a cane recently. Pt reports driving, doing ADLs, IADLs, etc. Pt reports wearing darco shoe to R foot for almost 2 years   PT Goals (current goals can now be found in the care plan section) Acute Rehab PT Goals Patient Stated Goal: Improve LLE pain PT Goal Formulation: With patient Time For Goal Achievement: 05/18/21 Potential to Achieve Goals: Good Progress towards PT goals: Progressing toward goals    Frequency    Min 3X/week      PT Plan Discharge plan needs to be updated       AM-PAC PT "6 Clicks" Mobility   Outcome Measure  Help needed turning from your back to your side while in a flat bed without using bedrails?: None Help needed moving from lying on your back to sitting on the side of a flat bed without using bedrails?: A Little Help needed moving to and from a bed to a chair (including a wheelchair)?: A Little Help needed standing up from a chair using your arms (e.g., wheelchair or bedside chair)?: A Little Help needed to walk in hospital room?: A Little Help needed climbing 3-5 steps with a railing? : A Lot 6 Click Score: 18    End of Session Equipment Utilized During Treatment: Gait belt Activity Tolerance: Patient tolerated treatment well;Patient limited by pain Patient left: in chair;with call bell/phone within reach;with chair alarm set;with family/visitor present Nurse Communication: Mobility status PT Visit Diagnosis: Unsteadiness on feet (R26.81);Other abnormalities  of gait and mobility (R26.89);Muscle weakness (generalized) (M62.81);Difficulty in walking, not elsewhere classified (R26.2) Pain - Right/Left: Left Pain - part of body: Leg     Time: 2549-8264 PT Time Calculation (min) (ACUTE ONLY): 22 min  Charges:  $Gait Training: 8-22 mins                     Inocencio Homes, PT, DPT   Acute Rehabilitation Department Pager #: [phone]   Sandra Cockayne 05/13/2021, 1:04 PM

## 2021-05-13 NOTE — Progress Notes (Signed)
ANTICOAGULATION CONSULT NOTE - Follow Up Consult  Pharmacy Consult for heparin Indication: graft patency s/p L common femoral-tibioperoneal trunk bypass  Allergies  Allergen Reactions   Bactrim [Sulfamethoxazole-Trimethoprim] Other (See Comments)    ^ K+( elevated)    Demerol [Meperidine]     Delusional    Scopolamine     Delusional    Tramadol Other (See Comments)    Keeps awake   Versed [Midazolam] Anxiety    Frantic, out of my mind, agitated     Patient Measurements: Height: 5' 4.5" (163.8 cm) Weight: 78 kg (171 lb 14.4 oz) IBW/kg (Calculated) : 55.85 Heparin Dosing Weight: 68 kg  Vital Signs: Temp: 97.5 F (36.4 C) (08/20 1136) Temp Source: Oral (08/20 1136) BP: 132/49 (08/20 1136) Pulse Rate: 80 (08/20 1136)  Labs: Recent Labs    05/11/21 0236 05/12/21 0228 05/13/21 0048 05/13/21 0815  HGB 7.9* 8.2*  --  7.8*  HCT 24.9* 26.7*  --  24.8*  PLT 515* 584*  --  432*  HEPARINUNFRC 0.47 0.47 0.55  --   CREATININE  --   --   --  0.75    Estimated Creatinine Clearance: 58.2 mL/min (by C-G formula based on SCr of 0.75 mg/dL).   Assessment: 80 y.o. female admitted with osteo and grangrene of toe now s/p left common femoral bypass and toe amputation 8/10.  Pharmacy consulted for therapeutic heparin dosing to promote graft patency. Per discussion with VVS team, patient may need additional procedure in near future and will continue heparin gtt for the time being.   HL 0.55 - slightly over goal so the heparin drip rate can be decreased. CBC stable    Goal of Therapy:  Heparin level 0.3-0.5 units/ml Monitor platelets by anticoagulation protocol: Yes   Plan:  Decrease heparin drip to 1700 units/hr Check 6-hour heparin level Daily heparin level and CBC  Thank you for including pharmacy in the care of this patient.  Zenaida Deed, PharmD PGY1 Acute Care Pharmacy Resident  Phone: [phone] 05/13/2021  3:40 PM  Please check [email or web address] for unit-specific  pharmacy phone numbers.

## 2021-05-13 NOTE — Progress Notes (Signed)
Pt Bp trended up after morning meds at 10am, PRN hydralazine administered.   Chrisandra Carota, RN 05/13/2021 3:54 PM

## 2021-05-13 NOTE — Progress Notes (Signed)
Patient transferred to room 4e01 with all belongings/\ Her room was needed for negative

## 2021-05-13 NOTE — Progress Notes (Signed)
Administered Prn hydrazine for second time today. MD notified.   Chrisandra Carota, RN 05/13/2021 6:46 PM

## 2021-05-13 NOTE — Progress Notes (Signed)
   05/13/21 0629  Vitals  BP (!) 177/67  MAP (mmHg) 97  BP Location Left Arm  BP Method Automatic  Patient Position (if appropriate) Lying  Pulse Rate (!) 114  Pulse Rate Source Monitor  Resp 16  Level of Consciousness  Level of Consciousness Alert  MEWS COLOR  MEWS Score Color Yellow  Oxygen Therapy  SpO2 96 %  O2 Device Room Air  MEWS Score  MEWS Temp 0  MEWS Systolic 0  MEWS Pulse 2  MEWS RR 0  MEWS LOC 0  MEWS Score 2  B.P. High Dr. Olevia Bowens aware  see orders for Labaterol 10 mg iv which was given

## 2021-05-13 NOTE — Progress Notes (Signed)
W u  PROGRESS NOTE    [name]  YKD:[id number] DOB: Jun 14, 1941 DOA: 04/27/2021 PCP: [name], MD   Brief Narrative: [name] is a 80 y.o. female with a history of hypertension, hyperlipidemia, CAD status post CABG, PAD status post bypass grafting, right transmetatarsal amputation, diabetes mellitus type 2.  Patient presented secondary to left toe wound with associated osteomyelitis.  Patient was initially managed on antibiotics and underwent left fem-pop bypass in addition to third toe amputation.  Now plan for left transmetatarsal amputation.   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 Patient started empirically on Vancomycin and Zosyn. She underwent left common femoral to tibioperoneal trunk bypass and left third toe amputation on 10/8.  Wound cultures (8/10) initially significant for GPCs and she was transitioned to Vancomycin monotherapy, then to Ceftriaxone IV. Culture sensitivities significant for oxacillin/penicillin sensitive Staph aureus/Streptococcus anginosus. Left foot wound with continued purulent drainage and patient underwent left transmetatarsal amputation on 8/18. Stable WBC. Afebrile. -Continue Cefazolin IV -Vascular surgery recommendations: watch over the weekend, possible wound vac  Sepsis Present on admission.  Secondary to cellulitis/osteomyelitis.  Antibiotics as mentioned above.  Physiology currently resolved.  Peripheral artery disease Patient has a history of right transmetatarsal amputation in 2020.  Patient is on aspirin and Plavix as an  outpatient in addition to Lipitor.  Plavix discontinued secondary to need for surgical management and patient started on heparin IV. -Continue aspirin, Lipitor, heparin IV with plans to discharge on Xarelto 2.5 mg twice daily  AKI Baseline creatinine of about 1.  Creatinine of 1.41 on admission with a peak creatinine of 1.6.  Currently resolved.  Hyponatremia Mild.  Resolved.  Primary hypertension Patient is on valsartan and hydrochlorothiazide as an outpatient which were held secondary to AKI. Blood pressure uncontrolled now. -Restart home valsartan (substituted with irbesartan while inpatient)  COVID-19 infection Asymptomatic. Incidental. Completed 10 day isolation.  Right pleural effusion History of right pleural effusion from 2021. Also history of grade 1 diastolic heart dysfunction. Asymptomatic. Recent COVID-19 infection. Chest x-ray with right sided pleural effusion. Given lasix IV x1.  CAD s/p CABG Patient is on telmisartan, Lipitor, Plavix, aspirin as an outpatient.  ARB held secondary to AKI.  Plavix held secondary to above.  Diabetes mellitus, type 2 Patient is on glimepiride and Invokana as an outpatient.  Hemoglobin A1c of 7.2%.  Patient started on Levemir 60 units daily and SSI while inpatient. -Continue Levemir and SSI  Chronic iron deficiency anemia Acute on chronic anemia It appears patient received 2 units of PRBC to date in setting of recent left femoral-below knee popliteal artery bypass grafting. Associated iron deficiency. Currently stable. -May give IV iron prior to discharge.   DVT prophylaxis: Heparin IV Code Status:   Code Status: Full Code Family Communication: None at bedside Disposition Plan: Discharge home likely in 3+ days pending vascular surgery recommendations/management   Consultants:  Vascular surgery  Procedures:  LEFT COMMON FEMORAL - TIBIOPERONEAL TRUNK BYPASS (05/03/2021) LEFT THIRD TOE OPEN AMPUTATION  (05/03/2021)  Antimicrobials: Vancomycin Cefepime Ceftriaxone Ancef    Subjective: No issues  overnight other than high BP. No symptoms.  Objective: Vitals:   05/13/21 0447 05/13/21 0455 05/13/21 0624 05/13/21 0629  BP: (!) 171/71  (!) 173/86 (!) 177/67  Pulse: 99   (!) 114  Resp: 16   16  Temp: 98.3 F (36.8 C)     TempSrc: Oral     SpO2: 97%   96%  Weight:  78 kg    Height:        Intake/Output Summary (Last 24 hours) at 05/13/2021 0719 Last data filed at 05/12/2021 2106 Gross per 24 hour  Intake 120 ml  Output 575 ml  Net -455 ml    Filed Weights   05/11/21 0637 05/12/21 0621 05/13/21 0455  Weight: 76.5 kg 77.3 kg 78 kg    Examination:  General exam: Appears calm and comfortable Respiratory system: Clear to anterior auscultation. Respiratory effort normal. Cardiovascular system: S1 & S2 heard, RRR. No murmurs, rubs, gallops or clicks. Gastrointestinal system: Abdomen is nondistended, soft and nontender. No organomegaly or masses felt. Normal bowel sounds heard. Central nervous system: Alert and oriented. No focal neurological deficits. Musculoskeletal: No calf tenderness. Left foot with bandage wrap Skin: No cyanosis. No rashes Psychiatry: Judgement and insight appear normal. Mood & affect appropriate.     Data Reviewed: I have personally reviewed following labs and imaging studies  CBC Lab Results  Component Value Date   WBC 16.1 (H) 05/12/2021   RBC 2.86 (L) 05/12/2021   HGB 8.2 (L) 05/12/2021   HCT 26.7 (L) 05/12/2021   MCV 93.4 05/12/2021   MCH 28.7 05/12/2021   PLT 584 (H) 05/12/2021   MCHC 30.7 05/12/2021   RDW 16.3 (H) 05/12/2021   LYMPHSABS 1.2 04/27/2021   MONOABS 1.9 (H) 04/27/2021   EOSABS 0.0 04/27/2021   BASOSABS 0.1 29/79/8921     Last metabolic panel Lab Results  Component Value Date   NA 139 05/10/2021   K 4.2 05/10/2021   CL 111 05/10/2021   CO2 23 05/10/2021   BUN 14 05/10/2021   CREATININE 0.92 05/10/2021   GLUCOSE  197 (H) 05/10/2021   GFRNONAA >60 05/10/2021   GFRAA 75 07/29/2020   CALCIUM 8.5 (L) 05/10/2021   PROT 7.1 08/10/2020   ALBUMIN 4.1 08/10/2020   BILITOT 0.5 08/10/2020   ALKPHOS 62 08/10/2020   AST 25 08/10/2020   ALT 30 08/10/2020   ANIONGAP 5 05/10/2021    CBG (last 3)  Recent Labs    05/12/21 1619 05/12/21 2137 05/13/21 0636  GLUCAP 147* 186* 163*      GFR: Estimated Creatinine Clearance: 50.6 mL/min (by C-G formula based on SCr of 0.92 mg/dL).  Coagulation Profile: No results for input(s): INR, PROTIME in the last 168 hours.  Recent Results (from the past 240 hour(s))  Fungus Culture With Stain     Status: Abnormal (Preliminary result)   Collection Time: 05/03/21  5:42 PM   Specimen: PATH Other; Tissue  Result Value Ref Range Status   Fungus Stain Final report (A)  Final    Comment: (NOTE) Performed At: [facility] [place], [place] [id number] Rush Farmer MD KG:[id number]    Fungus (Mycology) Culture PENDING  Incomplete   Fungal Source WOUND  Final    Comment: LEFT THIRD TOE SWABS FOR CULTURE SPEC A Performed at [facility], [place] [address], [place], [place] [address]   Aerobic/Anaerobic Culture w Gram Stain (surgical/deep wound)     Status: None   Collection Time: 05/03/21  5:42  PM   Specimen: PATH Other; Tissue  Result Value Ref Range Status   Specimen Description WOUND  Final   Special Requests LEFT THIRD TOE SWABS FOR CULTURE SPEC A  Final   Gram Stain   Final    RARE WBC PRESENT,BOTH PMN AND MONONUCLEAR RARE GRAM POSITIVE COCCI    Culture   Final    FEW STAPHYLOCOCCUS AUREUS FEW STREPTOCOCCUS ANGINOSIS NO ANAEROBES ISOLATED Performed at [facility], [place] [address] [place], [place] [address]    Report Status 05/09/2021 FINAL  Final   Organism ID, Bacteria STAPHYLOCOCCUS AUREUS  Final   Organism ID, Bacteria STREPTOCOCCUS ANGINOSIS  Final      Susceptibility   Staphylococcus aureus - MIC*    CIPROFLOXACIN  <=0.5 SENSITIVE Sensitive     ERYTHROMYCIN >=8 RESISTANT Resistant     GENTAMICIN <=0.5 SENSITIVE Sensitive     OXACILLIN 0.5 SENSITIVE Sensitive     TETRACYCLINE <=1 SENSITIVE Sensitive     VANCOMYCIN 1 SENSITIVE Sensitive     TRIMETH/SULFA <=10 SENSITIVE Sensitive     CLINDAMYCIN RESISTANT Resistant     RIFAMPIN <=0.5 SENSITIVE Sensitive     Inducible Clindamycin POSITIVE Resistant     * FEW STAPHYLOCOCCUS AUREUS   Streptococcus anginosis - MIC*    PENICILLIN <=0.06 SENSITIVE Sensitive     CEFTRIAXONE 0.25 SENSITIVE Sensitive     ERYTHROMYCIN <=0.12 SENSITIVE Sensitive     LEVOFLOXACIN 0.5 SENSITIVE Sensitive     VANCOMYCIN 0.5 SENSITIVE Sensitive     * FEW STREPTOCOCCUS ANGINOSIS  Fungus Culture Result     Status: Abnormal   Collection Time: 05/03/21  5:42 PM  Result Value Ref Range Status   Result 1 Comment (A)  Final    Comment: (NOTE) Fungal elements, such as arthroconidia, hyphal fragments, chlamydoconidia, observed. Performed At: [facility] [place], [place] [id number] Rush Farmer MD RX:[id number]          Radiology Studies: No results found.      Scheduled Meds:  ascorbic acid  250 mg Oral Q supper   aspirin EC  81 mg Oral QHS   atorvastatin  80 mg Oral Daily   brimonidine  1 drop Right Eye BID   Chlorhexidine Gluconate Cloth  6 each Topical Daily   docusate sodium  100 mg Oral Daily   insulin aspart  0-15 Units Subcutaneous TID WC   insulin detemir  6 Units Subcutaneous QHS   irbesartan  300 mg Oral Daily   metoprolol tartrate  37.5 mg Oral BID   pantoprazole  40 mg Oral Once per day on Mon Wed Fri   polyethylene glycol  17 g Oral Daily   pyridoxine  100 mg Oral QPM   saccharomyces boulardii  250 mg Oral BID   sodium chloride flush  3 mL Intravenous Q12H   Continuous Infusions:  sodium chloride      ceFAZolin (ANCEF) IV 2 g (05/13/21 0158)   heparin 1,800 Units/hr (05/12/21 1732)   magnesium sulfate bolus IVPB        LOS: 16 days     Cordelia Poche, MD [facility] 05/13/2021, 7:19 AM  If 7PM-7AM, please contact night-coverage [email or web address]

## 2021-05-13 NOTE — Progress Notes (Signed)
   05/13/21 0447  Vitals  Temp 98.3 F (36.8 C)  Temp Source Oral  BP (!) 171/71  MAP (mmHg) 98  BP Location Left Arm  BP Method Automatic  Patient Position (if appropriate) Lying  Pulse Rate 99  Pulse Rate Source Monitor  ECG Heart Rate 98  Resp 16  Level of Consciousness  Level of Consciousness Alert  MEWS COLOR  MEWS Score Color Green  Oxygen Therapy  SpO2 97 %  O2 Device Room Air  MEWS Score  MEWS Temp 0  MEWS Systolic 0  MEWS Pulse 0  MEWS RR 0  MEWS LOC 0  MEWS Score 0  B.P. high HTCZ given pper orders

## 2021-05-13 NOTE — Progress Notes (Signed)
[facility] night shift MedSurg coverage note.  The nursing staff reported that the patient's blood pressure was 173/86 mmHg and they have already used the allowed as needed doses of antihypertensives.  Labetalol 10 mg IVP x1 dose ordered.  The day team will be following and adjusting treatment later this morning.  Tennis Must, MD.

## 2021-05-13 NOTE — Progress Notes (Signed)
  Progress Note    05/13/2021 8:27 AM 2 Days Post-Op  Subjective:  says pain is well controlled   Vitals:   05/13/21 0629 05/13/21 0754  BP: (!) 177/67 (!) 139/51  Pulse: (!) 114 90  Resp: 16 16  Temp:  98.3 F (36.8 C)  SpO2: 96% 97%   Physical Exam: Cardiac:  regular Lungs:  non labored Incisions:  left TMA well appearing, good bleeding. Wet to dry dressings reapplied    Extremities:  left leg incisions are intact and healing well. Doppler AT, PT and peroneal signals Neurologic: alert and oriented  CBC    Component Value Date/Time   WBC 16.1 (H) 05/12/2021 0228   RBC 2.86 (L) 05/12/2021 0228   HGB 8.2 (L) 05/12/2021 0228   HGB 12.2 07/29/2020 0942   HCT 26.7 (L) 05/12/2021 0228   HCT 37.4 07/29/2020 0942   PLT 584 (H) 05/12/2021 0228   PLT 304 07/29/2020 0942   MCV 93.4 05/12/2021 0228   MCV 84 07/29/2020 0942   MCH 28.7 05/12/2021 0228   MCHC 30.7 05/12/2021 0228   RDW 16.3 (H) 05/12/2021 0228   RDW 14.8 07/29/2020 0942   LYMPHSABS 1.2 04/27/2021 1228   MONOABS 1.9 (H) 04/27/2021 1228   EOSABS 0.0 04/27/2021 1228   BASOSABS 0.1 04/27/2021 1228    BMET    Component Value Date/Time   NA 139 05/10/2021 0110   NA 142 07/29/2020 0942   K 4.2 05/10/2021 0110   CL 111 05/10/2021 0110   CO2 23 05/10/2021 0110   GLUCOSE 197 (H) 05/10/2021 0110   BUN 14 05/10/2021 0110   BUN 17 07/29/2020 0942   CREATININE 0.92 05/10/2021 0110   CREATININE 0.86 12/09/2019 1136   CALCIUM 8.5 (L) 05/10/2021 0110   GFRNONAA >60 05/10/2021 0110   GFRAA 75 07/29/2020 0942    INR    Component Value Date/Time   INR 1.1 04/27/2021 1228     Intake/Output Summary (Last 24 hours) at 05/13/2021 0827 Last data filed at 05/12/2021 2106 Gross per 24 hour  Intake 120 ml  Output 575 ml  Net -455 ml     Assessment/Plan:  80 y.o. female is s/p left common femoral - tibioperoneal trunk bypass with 59mm ringed ePTFE in anatomic tunnel and left third toe open amputation on  05/03/2021 by Dr. Stanford Breed  Left TMA  2 Days Post-Op   Left leg is well perfused with doppler PT/ AT/ Pero signals Left foot TMA well appearing. Continue wet to dry dressing changes Possible wound VAC on Monday Hemodynamically stable On Vanc IV Will need DOAC on d/c  Continue to mobilize as tolerated  DVT prophylaxis:  Heparin gtt   [name], PA-C [facility] [phone] 05/13/2021 8:27 AM

## 2021-05-14 LAB — GLUCOSE, CAPILLARY
Glucose-Capillary: 145 mg/dL — ABNORMAL HIGH (ref 70–99)
Glucose-Capillary: 162 mg/dL — ABNORMAL HIGH (ref 70–99)
Glucose-Capillary: 174 mg/dL — ABNORMAL HIGH (ref 70–99)
Glucose-Capillary: 208 mg/dL — ABNORMAL HIGH (ref 70–99)

## 2021-05-14 LAB — CBC
HCT: 25.4 % — ABNORMAL LOW (ref 36.0–46.0)
Hemoglobin: 8 g/dL — ABNORMAL LOW (ref 12.0–15.0)
MCH: 29.3 pg (ref 26.0–34.0)
MCHC: 31.5 g/dL (ref 30.0–36.0)
MCV: 93 fL (ref 80.0–100.0)
Platelets: 461 10*3/uL — ABNORMAL HIGH (ref 150–400)
RBC: 2.73 MIL/uL — ABNORMAL LOW (ref 3.87–5.11)
RDW: 17.2 % — ABNORMAL HIGH (ref 11.5–15.5)
WBC: 14.1 10*3/uL — ABNORMAL HIGH (ref 4.0–10.5)
nRBC: 0.2 % (ref 0.0–0.2)

## 2021-05-14 LAB — HEPARIN LEVEL (UNFRACTIONATED): Heparin Unfractionated: 0.47 IU/mL (ref 0.30–0.70)

## 2021-05-14 MED ORDER — RIVAROXABAN 2.5 MG PO TABS
2.5000 mg | ORAL_TABLET | Freq: Two times a day (BID) | ORAL | Status: DC
  Administered 2021-05-14 – 2021-05-15 (×2): 2.5 mg via ORAL
  Filled 2021-05-14 (×3): qty 1

## 2021-05-14 MED ORDER — FUROSEMIDE 10 MG/ML IJ SOLN
40.0000 mg | Freq: Once | INTRAMUSCULAR | Status: AC
  Administered 2021-05-14: 40 mg via INTRAVENOUS
  Filled 2021-05-14: qty 4

## 2021-05-14 NOTE — Progress Notes (Addendum)
W u  PROGRESS NOTE    [name]  TFT:[id number] DOB: [birth year] DOA: 04/27/2021 PCP: [name], MD   Brief Narrative: [name] is a 80 y.o. female with a history of hypertension, hyperlipidemia, CAD status post CABG, PAD status post bypass grafting, right transmetatarsal amputation, diabetes mellitus type 2.  Patient presented secondary to left toe wound with associated osteomyelitis.  Patient was initially managed on antibiotics and underwent left fem-pop bypass in addition to third toe amputation.  Now plan for left transmetatarsal amputation.   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 Patient started empirically on Vancomycin and Zosyn. She underwent left common femoral to tibioperoneal trunk bypass and left third toe amputation on 10/8.  Wound cultures (8/10) initially significant for GPCs and she was transitioned to Vancomycin monotherapy, then to Ceftriaxone IV. Culture sensitivities significant for oxacillin/penicillin sensitive Staph aureus/Streptococcus anginosus. Left foot wound with continued purulent drainage and patient underwent left transmetatarsal amputation on 8/18. Stable WBC. Afebrile. -Continue Cefazolin IV -Vascular surgery recommendations: watch over the weekend, wound vac  Sepsis Present on admission.  Secondary to cellulitis/osteomyelitis.  Antibiotics as mentioned above.  Physiology currently resolved.  Peripheral artery disease Patient has a history of right transmetatarsal amputation in 2020.  Patient is on aspirin and Plavix as an outpatient in  addition to Lipitor.  Plavix discontinued secondary to need for surgical management and patient started on heparin IV. -Continue aspirin, Lipitor, heparin IV with plans to discharge on Xarelto  AKI Baseline creatinine of about 1.  Creatinine of 1.41 on admission with a peak creatinine of 1.6.  Currently resolved.  Hyponatremia Mild.  Resolved.  Primary hypertension Patient is on valsartan and hydrochlorothiazide as an outpatient which were held secondary to AKI. Blood pressure uncontrolled now. -Continue valsartan (substituted with irbesartan while inpatient) -Hydrochlorothiazide restarted  COVID-19 infection Asymptomatic. Incidental. Completed 10 day isolation.  Right pleural effusion History of right pleural effusion from 2021. Also history of grade 1 diastolic heart dysfunction. Asymptomatic. Recent COVID-19 infection. Chest x-ray with right sided pleural effusion. Given lasix IV x1.  CAD s/p CABG Patient is on telmisartan, Lipitor, Plavix, aspirin as an outpatient.  ARB held secondary to AKI.  Plavix held secondary to above.  Diabetes mellitus, type 2 Patient is on glimepiride and Invokana as an outpatient.  Hemoglobin A1c of 7.2%.  Patient started on Levemir 60 units daily and SSI while inpatient. -Continue Levemir and SSI  Chronic iron deficiency anemia Acute on chronic anemia It appears patient received 2 units of PRBC to date in setting of recent left femoral-below knee popliteal artery bypass grafting. Associated iron deficiency. Currently stable. -May give IV iron prior to discharge.   DVT prophylaxis: Heparin IV Code Status:   Code Status: Full Code Family Communication: None at bedside Disposition Plan: Discharge home likely in 2-3 days pending vascular surgery recommendations/management   Consultants:  Vascular surgery  Procedures:  LEFT COMMON FEMORAL - TIBIOPERONEAL TRUNK BYPASS (05/03/2021) LEFT THIRD TOE OPEN AMPUTATION  (05/03/2021)  Antimicrobials: Vancomycin Cefepime Ceftriaxone Ancef    Subjective: No concerns this morning other than  continued high blood pressure.  Objective: Vitals:   05/13/21 1841 05/13/21 1920 05/14/21 0552 05/14/21 0800  BP: (!) 193/74 (!) 167/60 (!) 171/73 (!) 187/75  Pulse: (!) 110 (!) 109  (!) 106  Resp:   18 18  Temp:   98.2 F (36.8 C) 97.8 F (36.6 C)  TempSrc:   Oral Oral  SpO2: 99% 100%  98%  Weight:      Height:        Intake/Output Summary (Last 24 hours) at 05/14/2021 1042 Last data filed at 05/14/2021 0835 Gross per 24 hour  Intake 240 ml  Output --  Net 240 ml    Filed Weights   05/11/21 0637 05/12/21 0621 05/13/21 0455  Weight: 76.5 kg 77.3 kg 78 kg    Examination:  General exam: Appears calm and comfortable Respiratory system: Clear to auscultation. Respiratory effort normal. Cardiovascular system: S1 & S2 heard, RRR. No murmurs, rubs, gallops or clicks. Gastrointestinal system: Abdomen is nondistended, soft and nontender. No organomegaly or masses felt. Normal bowel sounds heard. Central nervous system: Alert and oriented. No focal neurological deficits. Musculoskeletal: 2+ BLE. No calf tenderness Skin: No cyanosis. No rashes Psychiatry: Judgement and insight appear normal. Mood & affect appropriate.    Data Reviewed: I have personally reviewed following labs and imaging studies  CBC Lab Results  Component Value Date   WBC 14.1 (H) 05/14/2021   RBC 2.73 (L) 05/14/2021   HGB 8.0 (L) 05/14/2021   HCT 25.4 (L) 05/14/2021   MCV 93.0 05/14/2021   MCH 29.3 05/14/2021   PLT 461 (H) 05/14/2021   MCHC 31.5 05/14/2021   RDW 17.2 (H) 05/14/2021   LYMPHSABS 1.2 04/27/2021   MONOABS 1.9 (H) 04/27/2021   EOSABS 0.0 04/27/2021   BASOSABS 0.1 95/28/4132     Last metabolic panel Lab Results  Component Value Date   NA 137 05/13/2021   K 3.4 (L) 05/13/2021   CL 108 05/13/2021   CO2 20 (L) 05/13/2021   BUN 11 05/13/2021   CREATININE  0.75 05/13/2021   GLUCOSE 134 (H) 05/13/2021   GFRNONAA >60 05/13/2021   GFRAA 75 07/29/2020   CALCIUM 8.8 (L) 05/13/2021   PROT 7.1 08/10/2020   ALBUMIN 4.1 08/10/2020   BILITOT 0.5 08/10/2020   ALKPHOS 62 08/10/2020   AST 25 08/10/2020   ALT 30 08/10/2020   ANIONGAP 9 05/13/2021    CBG (last 3)  Recent Labs    05/13/21 1634 05/13/21 2128 05/14/21 0536  GLUCAP 170* 204* 145*      GFR: Estimated Creatinine Clearance: 58.2 mL/min (by C-G formula based on SCr of 0.75 mg/dL).  Coagulation Profile: No results for input(s): INR, PROTIME in the last 168 hours.  No results found for this or any previous visit (from the past 240 hour(s)).        Radiology Studies: No results found.      Scheduled Meds:  ascorbic acid  250 mg Oral Q supper   aspirin EC  81 mg Oral QHS   atorvastatin  80 mg Oral Daily   brimonidine  1 drop Right Eye BID   Chlorhexidine Gluconate Cloth  6 each Topical Daily   docusate sodium  100 mg Oral Daily   hydrochlorothiazide  12.5 mg Oral Daily   insulin aspart  0-15 Units Subcutaneous TID WC   insulin detemir  6 Units Subcutaneous QHS   irbesartan  300 mg Oral Daily   labetalol  10 mg Intravenous Once   metoprolol tartrate  37.5 mg Oral BID   pantoprazole  40 mg Oral Once per day on Mon Wed Fri   polyethylene glycol  17 g Oral Daily   pyridoxine  100 mg Oral QPM   saccharomyces boulardii  250 mg Oral BID   sodium chloride flush  3 mL Intravenous Q12H   Continuous Infusions:  sodium chloride      ceFAZolin (ANCEF) IV 2 g (05/14/21 0917)   heparin 1,700 Units/hr (05/13/21 1557)   magnesium sulfate bolus IVPB       LOS: 17 days     Cordelia Poche, MD [facility] 05/14/2021, 10:42 AM  If 7PM-7AM, please contact night-coverage [email or web address]

## 2021-05-14 NOTE — Progress Notes (Addendum)
  Progress Note    05/14/2021 9:28 AM 3 Days Post-Op  Subjective:  very minimal pain, says foot is just very sensative   Vitals:   05/14/21 0552 05/14/21 0800  BP: (!) 171/73 (!) 187/75  Pulse:  (!) 106  Resp: 18 18  Temp: 98.2 F (36.8 C) 97.8 F (36.6 C)  SpO2:  98%   Physical Exam: Cardiac:  regular Lungs:  non labored Incisions:  left TMA well appearing, there is some duskiness of central plantar aspect of TMA as well as some tissue that does not appear as healthy, otherwise tissue appears viable.Good bleeding present in wound bed. Wet to dry dressings applied. Left leg incisions otherwise healing well. Dry eschar on the below knee incision unchanged    Extremities:  Brisk DP signal, PT and Pero signals present also. Foot warm. Neurologic: alert and oriented  CBC    Component Value Date/Time   WBC 14.1 (H) 05/14/2021 0204   RBC 2.73 (L) 05/14/2021 0204   HGB 8.0 (L) 05/14/2021 0204   HGB 12.2 07/29/2020 0942   HCT 25.4 (L) 05/14/2021 0204   HCT 37.4 07/29/2020 0942   PLT 461 (H) 05/14/2021 0204   PLT 304 07/29/2020 0942   MCV 93.0 05/14/2021 0204   MCV 84 07/29/2020 0942   MCH 29.3 05/14/2021 0204   MCHC 31.5 05/14/2021 0204   RDW 17.2 (H) 05/14/2021 0204   RDW 14.8 07/29/2020 0942   LYMPHSABS 1.2 04/27/2021 1228   MONOABS 1.9 (H) 04/27/2021 1228   EOSABS 0.0 04/27/2021 1228   BASOSABS 0.1 04/27/2021 1228    BMET    Component Value Date/Time   NA 137 05/13/2021 0815   NA 142 07/29/2020 0942   K 3.4 (L) 05/13/2021 0815   CL 108 05/13/2021 0815   CO2 20 (L) 05/13/2021 0815   GLUCOSE 134 (H) 05/13/2021 0815   BUN 11 05/13/2021 0815   BUN 17 07/29/2020 0942   CREATININE 0.75 05/13/2021 0815   CREATININE 0.86 12/09/2019 1136   CALCIUM 8.8 (L) 05/13/2021 0815   GFRNONAA >60 05/13/2021 0815   GFRAA 75 07/29/2020 0942    INR    Component Value Date/Time   INR 1.1 04/27/2021 1228     Intake/Output Summary (Last 24 hours) at 05/14/2021 0928 Last  data filed at 05/14/2021 0835 Gross per 24 hour  Intake 358 ml  Output 150 ml  Net 208 ml     Assessment/Plan:  80 y.o. female is s/p left common femoral - tibioperoneal trunk bypass with 51mm ringed ePTFE in anatomic tunnel and left third toe open amputation on 05/03/2021 by Dr. Stanford Breed  Left TMA  3 Days Post-Op   Left leg is well perfused with doppler PT/ AT/ Pero signals Left foot TMA well appearing. Continue wet to dry dressing changes Possible wound VAC tomorrow Hemodynamically stable On Vanc IV Okay to transition to oral anticoagulation  Continue to mobilize as tolerated   DVT prophylaxis:  heparin gtt   [name], PA-C [facility] [phone] 05/14/2021 9:28 AM  I have interviewed the patient and examined the patient. I agree with the findings by the PA.  Her open TMA looks excellent.  Plan is for placement of the [facility] tomorrow.  She has good Doppler signals in the right foot.  She can start her Xarelto.  Gae Gallop, MD

## 2021-05-14 NOTE — Progress Notes (Signed)
Patient has declined dressing change, she would like the PA to do change in the morning.

## 2021-05-14 NOTE — Progress Notes (Signed)
Mobility Specialist: Progress Note   05/14/21 1205  Mobility  Activity Ambulated in hall  Level of Assistance Standby assist, set-up cues, supervision of patient - no hands on  Assistive Device Front wheel walker  Distance Ambulated (ft) 100 ft  Mobility Ambulated with assistance in hallway  Mobility Response Tolerated well  Mobility performed by Mobility specialist  $Mobility charge 1 Mobility   Post-Mobility: 109 HR, 181/72 BP, 100% SpO2  Pt standby assist to stand from EOB after putting darco shoes on. Distance limited d/t general fatigue. Pt sitting EOB after walk and is set-up to eat her lunch. Pt has call bell and phone at her side.   [facility] Naiyana Barbian Mobility Specialist Mobility Specialist Phone: [phone]

## 2021-05-15 LAB — CBC
HCT: 26.8 % — ABNORMAL LOW (ref 36.0–46.0)
Hemoglobin: 8.2 g/dL — ABNORMAL LOW (ref 12.0–15.0)
MCH: 28.7 pg (ref 26.0–34.0)
MCHC: 30.6 g/dL (ref 30.0–36.0)
MCV: 93.7 fL (ref 80.0–100.0)
Platelets: 517 10*3/uL — ABNORMAL HIGH (ref 150–400)
RBC: 2.86 MIL/uL — ABNORMAL LOW (ref 3.87–5.11)
RDW: 17.5 % — ABNORMAL HIGH (ref 11.5–15.5)
WBC: 12.6 10*3/uL — ABNORMAL HIGH (ref 4.0–10.5)
nRBC: 0.2 % (ref 0.0–0.2)

## 2021-05-15 LAB — GLUCOSE, CAPILLARY
Glucose-Capillary: 132 mg/dL — ABNORMAL HIGH (ref 70–99)
Glucose-Capillary: 184 mg/dL — ABNORMAL HIGH (ref 70–99)

## 2021-05-15 LAB — BASIC METABOLIC PANEL
Anion gap: 6 (ref 5–15)
BUN: 8 mg/dL (ref 8–23)
CO2: 27 mmol/L (ref 22–32)
Calcium: 8.9 mg/dL (ref 8.9–10.3)
Chloride: 105 mmol/L (ref 98–111)
Creatinine, Ser: 0.8 mg/dL (ref 0.44–1.00)
GFR, Estimated: >60 mL/min (ref >60)
Glucose, Bld: 156 mg/dL — ABNORMAL HIGH (ref 70–99)
Potassium: 3.6 mmol/L (ref 3.5–5.1)
Sodium: 138 mmol/L (ref 135–145)

## 2021-05-15 MED ORDER — RIVAROXABAN 2.5 MG PO TABS: 2.5000 mg | ORAL_TABLET | Freq: Two times a day (BID) | ORAL | 0 refills | Status: DC

## 2021-05-15 MED ORDER — OXYCODONE HCL 5 MG PO TABS: 5.0000 mg | ORAL_TABLET | ORAL | 0 refills | Status: DC | PRN

## 2021-05-15 MED ORDER — CEPHALEXIN 500 MG PO CAPS: 500.0000 mg | ORAL_CAPSULE | Freq: Four times a day (QID) | ORAL | 0 refills | Status: AC

## 2021-05-15 NOTE — Discharge Summary (Addendum)
Physician Discharge Summary  [name] CXK:[id number] DOB: Oct 14, 1940 DOA: 04/27/2021  PCP: [name], MD  Admit date: 04/27/2021 Discharge date: 05/15/2021  Admitted From: Home Disposition: Home  Recommendations for Outpatient Follow-up:  Follow up with PCP in 1 week Follow up with vascular surgery Please obtain BMP/CBC in one week Please follow up on the following pending results: None  Home Health: PT, OT, Nursing Equipment/Devices: Wound vac  Discharge Condition: Stable CODE STATUS: Full code Diet recommendation: Regular   Brief/Interim Summary:  Admission HPI written by Norval Morton, MD   HPI: [name] is a 80 y.o. female with medical history significant of hypertension, hyperlipidemia, CAD s/p CABG in 2021, PVD s/p bypass grafting, diabetes mellitus type II, ray amputation of right foot presents with left third toe wound.  She had initially scraped her third toe on a barstool while she was playing pool 3 days ago.  At that time it did not bleed. She cleaned it with saline and put Iodoflex gauze around the wound.  However, patient noted that she had developed redness that was streaking up her left leg.  Associated symptoms included intermittent pain in affected toe as well as fever and chills yesterday.  Patient reports at home her fever got up to 102 F.  At home she has been taking Tylenol for her symptoms.  She is s/p right common femoral endarterectomy with femoral popliteal bypass graft on 08/16/2020.  And previously had angioplasty with stent continue to have the left SFA.   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 Patient started empirically on Vancomycin and Zosyn. She underwent left common femoral to tibioperoneal trunk bypass and left third toe amputation on 10/8.  Wound cultures (8/10) initially significant for GPCs and she was transitioned to Vancomycin monotherapy, then to  Ceftriaxone IV. Culture sensitivities significant for oxacillin/penicillin sensitive Staph aureus/Streptococcus anginosus. Left foot wound with continued purulent drainage and patient underwent left transmetatarsal amputation on 8/18. Stable WBC. Afebrile. Continue with Keflex to complete 5 days of antibiotics post-amputation. Wound vac ordered by vascular surgery with recommendation for outpatient follow-up. Home health nursing set up for patient.   Sepsis Present on admission.  Secondary to cellulitis/osteomyelitis.  Antibiotics as mentioned above.  Physiology currently resolved.   Peripheral artery disease Patient has a history of right transmetatarsal amputation in 2020.  Patient is on aspirin and Plavix as an outpatient in addition to Lipitor.  Plavix discontinued secondary to need for surgical management and patient started on heparin IV. Vascular surgery with recommendation to start patient on Xarelto 2.5 mg BID. Vascular surgery also recommended to continue aspirin and Plavix on discharge; confirmed prior to discharge. Prior to discharge, had further discussion and recommendation to discontinue Plavix on discharge; continue Aspirin and Xarelto   AKI Baseline creatinine of about 1.  Creatinine of 1.41 on admission with a peak creatinine of 1.6.  Currently resolved.   Hyponatremia Mild.  Resolved.   Primary hypertension Patient is on valsartan and hydrochlorothiazide as an outpatient which were held secondary to AKI. Blood pressure improved with resumption of home regimen. Continue home regimen on discharge.   COVID-19 infection Asymptomatic. Incidental. Completed 10 day isolation.   Right pleural effusion History of right pleural effusion from 2021. Also history of grade 1 diastolic heart dysfunction. Asymptomatic. Recent COVID-19 infection. Chest x-ray with right sided pleural effusion. Given lasix IV x2.    CAD s/p CABG Patient  is on telmisartan, Lipitor, Plavix, aspirin as an  outpatient.  ARB held secondary to AKI.  Plavix held secondary to above.   Diabetes mellitus, type 2 Patient is on glimepiride and Invokana as an outpatient.  Hemoglobin A1c of 7.2%.  Patient started on Levemir 60 units daily and SSI while inpatient. Resume home regimen on discharge.   Chronic iron deficiency anemia Acute on chronic anemia It appears patient received 2 units of PRBC to date in setting of recent left femoral-below knee popliteal artery bypass grafting. Associated iron deficiency. Currently stable.   Discharge Diagnoses:  Principal Problem:   Osteomyelitis of foot, left, acute ([place]) Active Problems:   Essential hypertension   PAD (peripheral artery disease) (HCC)   Cellulitis in diabetic foot (HCC)   Acute kidney injury superimposed on chronic kidney disease (HCC)   Hyponatremia   Anemia, chronic disease   Cellulitis of left lower extremity    Discharge Instructions   Allergies as of 05/15/2021       Reactions   Bactrim [sulfamethoxazole-trimethoprim] Other (See Comments)   ^ K+( elevated)    Demerol [meperidine]    Delusional    Scopolamine    Delusional    Tramadol Other (See Comments)   Keeps awake   Versed [midazolam] Anxiety   Frantic, out of my mind, agitated         Medication List     STOP taking these medications    clopidogrel 75 MG tablet Commonly known as: PLAVIX       TAKE these medications    acetaminophen 500 MG tablet Commonly known as: TYLENOL Take 1,000 mg by mouth every 6 (six) hours as needed for moderate pain or headache.   aspirin EC 81 MG tablet Take 81 mg by mouth at bedtime.   atorvastatin 80 MG tablet Commonly known as: LIPITOR Take 80 mg by mouth daily.   brimonidine 0.2 % ophthalmic solution Commonly known as: ALPHAGAN Place 1 drop into the right eye 2 (two) times daily.   canagliflozin 300 MG Tabs tablet Commonly known as: INVOKANA Take 300 mg by mouth daily before breakfast.   cephALEXin 500 MG  capsule Commonly known as: KEFLEX Take 1 capsule (500 mg total) by mouth 4 (four) times daily for 2 days.   CINNAMON PO Take 2 tablets by mouth in the morning, at noon, and at bedtime.   [employer] SENIOR PROBIOTIC PO Take 1 capsule by mouth daily with breakfast.   glimepiride 2 MG tablet Commonly known as: AMARYL Take 1 tablet (2 mg total) by mouth daily before breakfast.   hydrochlorothiazide 12.5 MG tablet Commonly known as: HYDRODIURIL Take 1 tablet by mouth once daily   metFORMIN 1000 MG tablet Commonly known as: GLUCOPHAGE Take 1 tablet by mouth twice daily What changed: when to take this   metoprolol tartrate 25 MG tablet Commonly known as: LOPRESSOR Take 1.5 tablets (37.5 mg total) by mouth 2 (two) times daily.   OneTouch Delica Plus [id number] Misc Use to check blood sugars twice daily.   OneTouch Verio test strip Generic drug: glucose blood Use to check blood sugars once daily What changed: additional instructions   oxyCODONE 5 MG immediate release tablet Commonly known as: Oxy IR/ROXICODONE Take 1-2 tablets (5-10 mg total) by mouth every 4 (four) hours as needed for moderate pain.   pantoprazole 40 MG tablet Commonly known as: PROTONIX Take 1 tablet (40 mg total) by mouth daily. What changed:  when to take this additional instructions   pyridoxine  100 MG tablet Commonly known as: B-6 Take 100 mg by mouth every evening.   rivaroxaban 2.5 MG Tabs tablet Commonly known as: XARELTO Take 1 tablet (2.5 mg total) by mouth 2 (two) times daily.   SYSTANE OP Place 1 drop into the left eye 2 (two) times daily as needed (Dry eyes).   telmisartan 80 MG tablet Commonly known as: MICARDIS Take 1 tablet by mouth once daily   vitamin B-12 500 MCG tablet Commonly known as: CYANOCOBALAMIN Take 500 mcg by mouth daily.   vitamin C 100 MG tablet Take 100 mg by mouth daily with supper.               Durable Medical Equipment  (From admission, onward)            Start     Ordered   05/15/21 1540  For home use only DME 4 wheeled rolling walker with seat  Once       Comments: Ht 5'4  Question:  Patient needs a walker to treat with the following condition  Answer:  Physical deconditioning   05/15/21 1540            Allergies  Allergen Reactions   Bactrim [Sulfamethoxazole-Trimethoprim] Other (See Comments)    ^ K+( elevated)    Demerol [Meperidine]     Delusional    Scopolamine     Delusional    Tramadol Other (See Comments)    Keeps awake   Versed [Midazolam] Anxiety    Frantic, out of my mind, agitated     Consultations: Vascular surgery   Procedures/Studies: PERIPHERAL VASCULAR CATHETERIZATION  Result Date: 04/28/2021 Procedure: Ultrasound right groin, abdominal aortogram with bilateral lower extremity runoff Preoperative diagnosis: Gangrene left foot Postoperative diagnosis: Same Anesthesia: Local Operative findings: 1.  Severe left superficial femoral artery occlusive disease with 3 cm occlusion of left popliteal artery two-vessel runoff left foot moderate to severe disease below-knee popliteal artery and tibial disease 2.  80% stenosis distal right external iliac artery above existing femoral above-knee popliteal bypass runoff not well visualized secondary to sheath being occlusive in the right external iliac artery Operative details: After obtaining informed consent, the patient was taken to the [facility] lab.  The patient was placed in supine position on the angio table.  Both groins were prepped and draped in usual sterile fashion.  Ultrasound was used to identify the patient's right common femoral artery and a pre-existing right femoral-popliteal bypass.  Local anesthesia was infiltrated over the common femoral artery just proximal to the bypass.  Initially I tried to use a micropuncture needle to access this and I could not get the micropuncture wire advanced.  Direct pressure was held over the groin for about 5 minutes.  I then  proceeded to use a standard introducer needle and was able to cannulate the artery and advanced an 035 versa core wire up into the abdominal aorta.  A 5 French sheath was then placed over the guidewire after dilating the tract with a 6 French dilator due to previous scar tissue.  5 French pigtail catheter was advanced up into the abdominal aorta under fluoroscopic guidance.  Abdominal aortogram was then obtained in AP projection.  Left and right renal arteries are patent.  Infrarenal abdominal aorta is patent.  The left external common internal iliac arteries are patent.  The right common iliac artery is patent.  There is a subtotal occlusion of the right external iliac artery.  The right internal iliac artery is patent.  At this point since the sheath on the right side was intentionally occlusive we administered 7000 units of intravenous heparin.  I then obtained bilateral lower extremity runoff views through the pigtail catheter. In the left lower extremity the left common femoral profunda femoris and superficial femoral femoral arteries are patent.  The proximal superficial femoral artery on the left side has a stent extending about 7 cm from the origin of the SFA which is patent.  The remainder of the distal left superficial femoral artery and proximal popliteal artery is diffusely diseased with multiple segments of 50 to 90% stenosis.  The popliteal artery is occluded.  The below-knee popliteal artery then reconstitutes and gives off two-vessel runoff via the anterior tibial and posterior tibial artery to the left foot. In the right lower extremity the right common femoral has what appears to be an endarterectomy which is patent.  Initially the right leg did not fill any further down in the common femoral.  I then removed the pigtail catheter and using the sheath in the right groin obtained several films pulling the sheath back where it was just inside the vessel on the right side.  There is a 90% stenosis of  the distal external iliac artery.  The common femoral artery does not fill as well as the profunda.  There is very delayed filling of the left femoral to above-knee popliteal bypass.  There is no flow below the distal anastomosis of the femoral above-knee popliteal bypass. Currently there was not enough room due to the area of the puncture site just above the femoropopliteal to have enough room to stent the external iliac artery.  I discussed the findings and showed the images to Dr. Stanford Breed who is on-call this weekend.  The patient's right leg was dusky and mottled in appearance at the conclusion of the procedure.  We decided to administer protamine to reverse her heparin and remove the sheath to see if the right leg perfusion improves.  Hopefully this is secondary to the sheath being occlusive rather than the femoropopliteal now being occluded.  If not she will need an emergent operation later today to thrombectomize her right femoropopliteal and a right femoral endarterectomy versus stent.  As far as the left leg is concerned she will most likely need a left femoral to below-knee popliteal bypass and a toe amputation.  All of this was discussed with the patient and Dr. Jill Side will follow up with her after the sheath has been removed. Ruta Hinds, MD [facility] Office: [phone]   DG CHEST PORT 1 VIEW  Result Date: 05/10/2021 CLINICAL DATA:  Decreased right-sided breath sounds EXAM: PORTABLE CHEST 1 VIEW COMPARISON:  Chest x-ray dated April 04, 2020 FINDINGS: Cardiac and mediastinal contours are unchanged post median sternotomy. Small right pleural effusion. Mild bilateral interstitial opacities. No evidence of pneumothorax. IMPRESSION: Small right pleural effusion. Mild bilateral interstitial opacities, possibly due to pulmonary edema. Electronically Signed   By: Yetta Glassman M.D.   On: 05/10/2021 11:56   DG Foot Complete Left  Result Date: 04/27/2021 CLINICAL  DATA:  Wound along the left third toe. History of diabetes. 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 Irregularity of the adjacent soft tissues noted. There is gas tracking in the soft tissues of the proximal toe around the proximal metaphysis of the proximal phalanx of the third  toe, indicating soft tissue infection. There is shortening of the proximal phalanx of the fourth toe with absence of the prior head of the proximal phalanx related to the fractures shown on 09/04/2019. Old deformity of the fifth metatarsal compatible with old healed fracture. Accessory navicular. Plantar and Achilles calcaneal spurs. Atherosclerotic calcification. Dorsal midfoot spurring. IMPRESSION: 1. 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 Abnormal gas tracking in the soft tissues of the proximal third toe. 2. Old deformities of the proximal phalanx fourth toe and of the fifth metatarsal resulting from prior fractures. 3. Plantar and Achilles calcaneal spurs. 4. Atherosclerosis. Electronically Signed   By: Van Clines M.D.   On: 04/27/2021 13:17   VAS [place] LOWER EXTREMITY ARTERIAL DUPLEX  Result Date: 04/28/2021 LOWER EXTREMITY ARTERIAL DUPLEX STUDY Patient Name:  [name]  Date of Exam:   04/28/2021 Medical Rec #: [id number]      Accession #:    [id number] Date of Birth: 1940-11-23      Patient Gender: F Patient Age:   27 years Exam Location:  [facility] Procedure:      VAS [place] LOWER EXTREMITY ARTERIAL DUPLEX Referring Phys: Servando Snare --------------------------------------------------------------------------------  Indications: Gangrene. High Risk Factors: Hypertension, hyperlipidemia, Diabetes, past history of                    smoking, prior MI, coronary artery disease.  Current ABI: Not obtained Limitations: color and pulsed wave Doppler artifact Comparison Study:  06/09/2020 lower extremity arterial duplex Performing Technologist: Maudry Mayhew MHA, RDMS, RVT, RDCS  Examination Guidelines: A complete evaluation includes B-mode imaging, spectral Doppler, color Doppler, and power Doppler as needed of all accessible portions of each vessel. Bilateral testing is considered an integral part of a complete examination. Limited examinations for reoccurring indications may be performed as noted.   +-----------+--------+-----+---------------+------------------+--------+ LEFT       PSV cm/sRatioStenosis       Waveform          Comments +-----------+--------+-----+---------------+------------------+--------+ CFA Distal 151          30-49% stenosisbiphasic                   +-----------+--------+-----+---------------+------------------+--------+ DFA        320          50-74% stenosisbiphasic                   +-----------+--------+-----+---------------+------------------+--------+ SFA Prox   157          30-49% stenosismonophasic                 +-----------+--------+-----+---------------+------------------+--------+ SFA Mid    318          50-74% stenosismonophasic                 +-----------+--------+-----+---------------+------------------+--------+ SFA Distal 215          50-74% stenosismonophasic                 +-----------+--------+-----+---------------+------------------+--------+ POP Distal 52                          monophasic                 +-----------+--------+-----+---------------+------------------+--------+ TP Trunk   80                          monophasic                 +-----------+--------+-----+---------------+------------------+--------+  ATA Prox   61                          monophasic                 +-----------+--------+-----+---------------+------------------+--------+ ATA Distal 9                           Thumped monophasic          +-----------+--------+-----+---------------+------------------+--------+ PTA Prox   30                          monophasic                 +-----------+--------+-----+---------------+------------------+--------+ PTA Distal 27                          monophasic                 +-----------+--------+-----+---------------+------------------+--------+ PERO Distal             occluded                                  +-----------+--------+-----+---------------+------------------+--------+ DP         61                                                     +-----------+--------+-----+---------------+------------------+--------+  Summary: Left: 30-49% stenosis noted in the common femoral artery. 50-74% stenosis noted in the deep femoral artery. 50-74% stenosis noted in the superficial femoral artery. Total occlusion noted in the peroneal artery.  See table(s) above for measurements and observations. Electronically signed by Jamelle Haring on 04/28/2021 at 5:36:03 PM.    Final    VAS [place] UPPER EXT VEIN MAPPING (PRE-OP AVF)  Result Date: 04/30/2021 UPPER EXTREMITY VEIN MAPPING Patient Name:  [name]  Date of Exam:   04/29/2021 Medical Rec #: [id number]      Accession #:    [id number] Date of Birth: Jul 13, 1941      Patient Gender: F Patient Age:   57 years Exam Location:  [facility] Procedure:      VAS [place] UPPER EXT VEIN MAPPING (PRE-OP AVF) Referring Phys: Jamelle Haring --------------------------------------------------------------------------------  Indications: Pre-access. History: PAD.  Comparison Study: No prior upper extremity vein mapping. Prior lower extremity                   vein mapping done 11/21 Performing Technologist: Sharion Dove RVS  Examination Guidelines: A complete evaluation includes B-mode imaging, spectral Doppler, color Doppler, and power Doppler as needed of all accessible portions of each vessel. Bilateral testing is considered an integral part of a complete  examination. Limited examinations for reoccurring indications may be performed as noted. +-----------------+-------------+----------+---------+ Right Cephalic   Diameter (cm)Depth (cm)Findings  +-----------------+-------------+----------+---------+ Prox upper arm       0.30        0.79             +-----------------+-------------+----------+---------+ Mid upper arm        0.32        0.88   branching +-----------------+-------------+----------+---------+ Dist upper arm  0.30        0.70             +-----------------+-------------+----------+---------+ Antecubital fossa    0.38        0.58             +-----------------+-------------+----------+---------+ Prox forearm         0.24        0.17   branching +-----------------+-------------+----------+---------+ Mid forearm          0.29        0.52   branching +-----------------+-------------+----------+---------+ Dist forearm         0.29        0.36   branching +-----------------+-------------+----------+---------+ Wrist                0.29        0.35   thrombus  +-----------------+-------------+----------+---------+ +-----------------+-------------+----------+---------+ Left Cephalic    Diameter (cm)Depth (cm)Findings  +-----------------+-------------+----------+---------+ Prox upper arm       0.28        0.13             +-----------------+-------------+----------+---------+ Mid upper arm        0.33        0.93             +-----------------+-------------+----------+---------+ Dist upper arm       0.37        0.91             +-----------------+-------------+----------+---------+ Antecubital fossa    0.45        0.23             +-----------------+-------------+----------+---------+ Prox forearm         0.28        0.66   branching +-----------------+-------------+----------+---------+ Mid forearm          0.20        0.44              +-----------------+-------------+----------+---------+ Dist forearm         0.16        0.24             +-----------------+-------------+----------+---------+ Wrist                0.16        0.25             +-----------------+-------------+----------+---------+ +-----------------+-------------+----------+--------------+ Left Basilic     Diameter (cm)Depth (cm)   Findings    +-----------------+-------------+----------+--------------+ Prox upper arm       0.30        0.68                  +-----------------+-------------+----------+--------------+ Mid upper arm        0.33        0.70                  +-----------------+-------------+----------+--------------+ Dist upper arm       0.29        0.80                  +-----------------+-------------+----------+--------------+ Antecubital fossa    0.41        0.23                  +-----------------+-------------+----------+--------------+ Prox forearm         0.25        0.60     branching    +-----------------+-------------+----------+--------------+ Mid forearm  0.20        0.34                  +-----------------+-------------+----------+--------------+ Distal forearm       0.19        0.30                  +-----------------+-------------+----------+--------------+ Wrist                                   not visualized +-----------------+-------------+----------+--------------+ *See table(s) above for measurements and observations.  Diagnosing physician: Jamelle Haring Electronically signed by Jamelle Haring on 04/30/2021 at 2:01:19 PM.    Final       Subjective: No concerns/issues overnight.  Discharge Exam: Vitals:   05/14/21 1607 05/15/21 0400  BP: (!) 155/74 (!) 155/70  Pulse: 99 73  Resp: 18   Temp: 97.6 F (36.4 C) 97.7 F (36.5 C)  SpO2: 100% 98%   Vitals:   05/14/21 1000 05/14/21 1126 05/14/21 1607 05/15/21 0400  BP: (!) 153/61 (!) 159/60 (!) 155/74 (!) 155/70  Pulse: 97 90 99  73  Resp:  18 18   Temp:  98 F (36.7 C) 97.6 F (36.4 C) 97.7 F (36.5 C)  TempSrc:  Oral Oral Oral  SpO2: 98% 99% 100% 98%  Weight:      Height:        General: Pt is alert, awake, not in acute distress Cardiovascular: RRR, S1/S2 +, no rubs, no gallops Respiratory: CTA bilaterally, no wheezing, no rhonchi Abdominal: Soft, NT, ND, bowel sounds + Extremities: 1+ pitting edema. Left foot in bandage wrap    The results of significant diagnostics from this hospitalization (including imaging, microbiology, ancillary and laboratory) are listed below for reference.     Labs:  Basic Metabolic Panel: Recent Labs  Lab 05/09/21 0117 05/10/21 0110 05/13/21 0815 05/15/21 0115  NA 138 139 137 138  K 3.8 4.2 3.4* 3.6  CL 110 111 108 105  CO2 21* 23 20* 27  GLUCOSE 196* 197* 134* 156*  BUN 16 14 11 8   CREATININE 1.01* 0.92 0.75 0.80  CALCIUM 8.3* 8.5* 8.8* 8.9   CBC: Recent Labs  Lab 05/11/21 0236 05/12/21 0228 05/13/21 0815 05/14/21 0204 05/15/21 0115  WBC 14.5* 16.1* 15.1* 14.1* 12.6*  HGB 7.9* 8.2* 7.8* 8.0* 8.2*  HCT 24.9* 26.7* 24.8* 25.4* 26.8*  MCV 91.2 93.4 92.5 93.0 93.7  PLT 515* 584* 432* 461* 517*   CBG: Recent Labs  Lab 05/14/21 0536 05/14/21 1128 05/14/21 1605 05/14/21 2006 05/15/21 0513  GLUCAP 145* 162* 174* 208* 132*     Time coordinating discharge: 35 minutes  SIGNED:   Cordelia Poche, MD [facility] 05/15/2021, 9:50 AM

## 2021-05-15 NOTE — Consult Note (Signed)
WOC Nurse Consult Note: First post op NPWT dressing change to left transmetatarsal amputation site.  Patient is premedicated with Morphine.   Reason for Consult: VAC change to surgical site left foot.  Wound type: surgical Pressure Injury POA: NA Measurement: 9 cm x 4 cm x 1 cm  Wound bed: beefy red, bleeds with cleansing Drainage (amount, consistency, odor) minimal serosanguinous  no odor.  Periwound:barrier ring to periwound to promote seal. Will bridge dressing to dorsal foot to offload pressure Dressing procedure/placement/frequency:  Barrier ring to periwound, 1 piece foam to wound bed, 1 piece foam to dorsal foot with skin protected with drape.  Seal achieved after reinforcing the borders .   Change Mon/Wed/Fri.  Will follow.  Domenic Moras MSN, RN, FNP-BC CWON Wound, Ostomy, Continence Nurse Pager [phone]

## 2021-05-15 NOTE — Progress Notes (Signed)
Mobility Specialist: Progress Note   05/15/21 1516  Mobility  Activity Ambulated in hall  Level of Assistance Standby assist, set-up cues, supervision of patient - no hands on  Assistive Device Four wheel walker  Distance Ambulated (ft) 100 ft  Mobility Ambulated with assistance in hallway  Mobility Response Tolerated well  Mobility performed by Mobility specialist  $Mobility charge 1 Mobility   Pre-Mobility: 92 HR, 155/61 BP, 95% SpO2 Post-Mobility: 111 HR, 170/63 BP, 98% SpO2  Pt standby assist to stand from the chair. Pt did well with rollator trial and said she would like a four wheel rollator for home. Distance limited d/t fatigue and pain, no rating given. Pt back to bed after walk with call bell and phone at her side.   [facility] Ravynn Hogate Mobility Specialist Mobility Specialist Phone: [phone]

## 2021-05-15 NOTE — Progress Notes (Addendum)
  Progress Note    05/15/2021 7:48 AM 4 Days Post-Op  Subjective:  no complaints   Vitals:   05/14/21 1607 05/15/21 0400  BP: (!) 155/74 (!) 155/70  Pulse: 99 73  Resp: 18   Temp: 97.6 F (36.4 C) 97.7 F (36.5 C)  SpO2: 100% 98%   Physical Exam: Cardiac:  regular Lungs:  non labored Incisions:  left groin and leg incisions are clean, dry and intact. 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 Left TMA wound dressings non changed Extremities:   Neurologic: alert and oriented  CBC    Component Value Date/Time   WBC 12.6 (H) 05/15/2021 0115   RBC 2.86 (L) 05/15/2021 0115   HGB 8.2 (L) 05/15/2021 0115   HGB 12.2 07/29/2020 0942   HCT 26.8 (L) 05/15/2021 0115   HCT 37.4 07/29/2020 0942   PLT 517 (H) 05/15/2021 0115   PLT 304 07/29/2020 0942   MCV 93.7 05/15/2021 0115   MCV 84 07/29/2020 0942   MCH 28.7 05/15/2021 0115   MCHC 30.6 05/15/2021 0115   RDW 17.5 (H) 05/15/2021 0115   RDW 14.8 07/29/2020 0942   LYMPHSABS 1.2 04/27/2021 1228   MONOABS 1.9 (H) 04/27/2021 1228   EOSABS 0.0 04/27/2021 1228   BASOSABS 0.1 04/27/2021 1228    BMET    Component Value Date/Time   NA 138 05/15/2021 0115   NA 142 07/29/2020 0942   K 3.6 05/15/2021 0115   CL 105 05/15/2021 0115   CO2 27 05/15/2021 0115   GLUCOSE 156 (H) 05/15/2021 0115   BUN 8 05/15/2021 0115   BUN 17 07/29/2020 0942   CREATININE 0.80 05/15/2021 0115   CREATININE 0.86 12/09/2019 1136   CALCIUM 8.9 05/15/2021 0115   GFRNONAA >60 05/15/2021 0115   GFRAA 75 07/29/2020 0942    INR    Component Value Date/Time   INR 1.1 04/27/2021 1228     Intake/Output Summary (Last 24 hours) at 05/15/2021 0748 Last data filed at 05/14/2021 1819 Gross per 24 hour  Intake 240 ml  Output 1400 ml  Net -1160 ml     Assessment/Plan:  80 y.o. female is s/p left common femoral - tibioperoneal trunk bypass with 31mm ringed ePTFE in anatomic tunnel and left third toe open amputation on 05/03/2021 by Dr. Stanford Breed  Left TMA    4 Days Post-Op   Left leg is well perfused with doppler PT/ AT/ Pero signals Left foot TMA did not change dressings this morning. Clean and dry.  wound VAC  application by wound care RN today Hemodynamically stable On Vanc IV On Xarelto 2.5 mg BID Continue to mobilize as tolerated Okay for discharge today once auth/ home wound VAC arranged  DVT prophylaxis:  heparin gtt   [name], PA-C [facility] [phone] 05/15/2021 7:48 AM  VASCULAR STAFF ADDENDUM: I have independently interviewed and examined the patient. I agree with the above.  Looks great. OK for discharge once home health / VAC set up. Will see her in close follow up in office (1-2 weeks).  [name]. Stanford Breed, MD [facility] Phone Number: [phone] 05/15/2021 11:44 AM

## 2021-05-15 NOTE — TOC Transition Note (Signed)
Transition of Care [facility]) - CM/SW Discharge Note   Patient Details  Name: [name] MRN: [id number] Date of Birth: Aug 20, 1941  [facility] of [facility] CM/SW Contact:  Bethena Roys, RN Phone Number: 05/15/2021, 12:33 PM   Clinical Narrative:  Risk for readmission assessment completed. Patient states she is from home with family support and the plan is to return home. Patient has durable medical equipment (DME) cane, bedside commode, and a regular rolling walker. Case Manager spoke with the patient regarding home health services. Patient was prearranged for home health services by the vascular office. Services include Registered Nurse for the Wound Vac, Occupational and Physical Therapy. Patient is aware of the agency and is agreeable to the services. Case Manager did call [facility] to make the agency aware that the plan is for d/c today once the wound vac arrives. Case Manager ordered the wound vac via [employer] and spoke with Traci, the order has been processed and the plan is to have the wound vac delivered to the hospital by 1700 today. MD and Staff RN are aware of the plan of care. Staff RN is aware to replace the hospital wound vac with the home wound vac prior to transition home. Patient had asked for a 3 wheeled rollator, [employer] does not have any in stock and call was placed to [employer] to check on DME as well. Case Manager did make the patient aware that she can order from [employer]. Patient declined the 4 wheeled rollator stating that it was too big to get into the home doorways. Daughter [name] will provide transportation home. No further needs from Case Manager at this time.     Final next level of care: [place] Barriers to Discharge: No Barriers Identified   Patient Goals and CMS Choice Patient states their goals for this hospitalization and ongoing recovery are:: to return home with home health services. CMS Medicare.gov Compare Post Acute Care list provided  to:: Patient Choice offered to / list presented to :  (Pre-arranged with the vascular office.)   Discharge Plan and Services In-house Referral: Clinical Social Work Discharge Planning Services: CM Consult Post Acute Care Choice: Home Health          DME Arranged: Negative pressure wound device DME Agency: [employer] (Wound Vac via [employer] M. Patient was looking for the 3 wheeled rollator and agencies do not have in stock.) Date DME Agency Contacted: 05/15/21 Time DME Agency Contacted: [phone] Representative spoke with at DME Agency: Traci HH Arranged: RN, Disease Management, PT, OT [place] Agency: [place] Date [place]: 05/15/21 Time [place]: 1200 Representative spoke with at [place]: Amy  Readmission Risk Interventions Readmission Risk Prevention Plan 05/15/2021 07/22/2019  Post Dischage Appt - Complete  Medication Screening - Complete  Transportation Screening Complete Complete  PCP or Specialist Appt within 3-5 Days Complete -  HRI or Home Care Consult Complete -  Social Work Consult for [place] Planning/Counseling Complete -  Palliative Care Screening Not Applicable -  Medication Review Press photographer) Complete -  Some recent data might be hidden

## 2021-05-15 NOTE — Care Management (Signed)
05-15-21 1547 Case Manager spoke with patient and she is now agreeable to a four wheeled rollator. Case Manager ordered the rollator via [employer], the office has none in stock at this time. Office will drop ship the rollator to the patient's home. Case Manager made the patient and staff RN aware of plan. No further needs at this time.

## 2021-05-15 NOTE — Care Management Important Message (Signed)
Important Message  Patient Details  Name: TAMEYA KUZNIA MRN: [id number] Date of Birth: [birth year]   Medicare Important Message Given:  Yes     Shelda Altes 05/15/2021, 10:29 AM

## 2021-05-15 NOTE — Discharge Instructions (Addendum)
[facility]  Discharge instructions  Lower Extremity Bypass Surgery  Please refer to the following instruction for your post-procedure care. Your surgeon or physician assistant will discuss any changes with you.  Activity  You are encouraged to walk as much as you can. You can slowly return to normal activities during the month after your surgery. Avoid strenuous activity and heavy lifting until your doctor tells you it's OK. Avoid activities such as vacuuming or swinging a golf club. Do not drive until your doctor give the OK and you are no longer taking prescription pain medications. It is also normal to have difficulty with sleep habits, eating and bowel movement after surgery. These will go away with time.  Bathing/Showering  Shower daily after you go home. Do not soak in a bathtub, hot tub, or swim until the incision heals completely.  Incision Care  Clean your incision with mild soap and water. Shower every day. Pat the area dry with a clean towel. You do not need a bandage unless otherwise instructed. Do not apply any ointments or creams to your incision. If you have open wounds you will be instructed how to care for them or a visiting nurse may be arranged for you. If you have staples or sutures along your incision they will be removed at your post-op appointment. You may have skin glue on your incision. Do not peel it off. It will come off on its own in about one week.  Wash the groin wound with soap and water daily and pat dry. (No tub bath-only shower)  Then put a dry gauze or washcloth in the groin to keep this area dry to help prevent wound infection.  Do this daily and as needed.  Do not use Vaseline or neosporin on your incisions.  Only use soap and water on your incisions and then protect and keep dry.  Diet  Resume your normal diet. There are no special food restrictions following this procedure. A low fat/ low cholesterol diet is  recommended for all patients with vascular disease. In order to heal from your surgery, it is CRITICAL to get adequate nutrition. Your body requires vitamins, minerals, and protein. Vegetables are the best source of vitamins and minerals. Vegetables also provide the perfect balance of protein. Processed food has little nutritional value, so try to avoid this.  Medications  Resume taking all your medications unless your doctor or physician assistant tells you not to. If your incision is causing pain, you may take over-the-counter pain relievers such as acetaminophen (Tylenol). If you were prescribed a stronger pain medication, please aware these medication can cause nausea and constipation. Prevent nausea by taking the medication with a snack or meal. Avoid constipation by drinking plenty of fluids and eating foods with high amount of fiber, such as fruits, vegetables, and grains. Take Colace 100 mg (an over-the-counter stool softener) twice a day as needed for constipation.   Do not take Tylenol if you are taking prescription pain medications.  Follow Up  Our office will schedule a follow up appointment 2-3 weeks following discharge.  Please call us immediately for any of the following conditions  Severe or worsening pain in your legs or feet while at rest or while walking Increase pain, redness, warmth, or drainage (pus) from your incision site(s) Fever of 101 degree or higher The swelling in your leg with the bypass suddenly worsens and becomes more painful than when you were in the hospital If you  have been instructed to feel your graft pulse then you should do so every day. If you can no longer feel this pulse, call the office immediately. Not all patients are given this instruction.  Leg swelling is common after leg bypass surgery.  The swelling should improve over a few months following surgery. To improve the swelling, you may elevate your legs above the level of your heart while you are  sitting or resting. Your surgeon or physician assistant may ask you to apply an ACE wrap or wear compression (TED) stockings to help to reduce swelling.  Reduce your risk of vascular disease  Stop smoking. If you would like help call [facility] at 1-800-QUIT-NOW 787-486-0476) or [place] at [phone].  Manage your cholesterol Maintain a desired weight Control your diabetes weight Control your diabetes Keep your blood pressure down  If you have any questions, please call the office at [phone]  ================================================================================================================= Information on my medicine - XARELTO (rivaroxaban)  WHY WAS [place]? Xarelto was prescribed for peripheral artery disease, to reduce the risk of blockages occurring in the arteries (blood vessels) of your legs.   What do you need to know about Xarelto? The dose is one 2.5 mg tablet taken TWICE daily.  It may be taken with or without food.  DO NOT stop taking Xarelto without talking to the health care provider who prescribed the medication.  Refill your prescription before you run out.  After discharge, you should have regular check-up appointments with your healthcare provider that is prescribing your Xarelto.  In the future your dose may need to be changed if your kidney function changes by a significant amount.  What do you do if you miss a dose? If you are taking Xarelto TWICE DAILY and you miss a dose, skip the missed dose and take your next dose at your usual time.   Important Safety Information Xarelto is a blood thinner medicine that can cause bleeding. You should call your healthcare provider right away if you experience any of the following: Bleeding from an injury or your nose that does not stop. Unusual colored urine (red or dark brown) or unusual colored stools (red or black). Unusual bruising for unknown reasons. A serious fall  or if you hit your head (even if there is no bleeding).  Some medicines may interact with Xarelto and might increase your risk of bleeding while on Xarelto. To help avoid this, consult your healthcare provider or pharmacist prior to using any new prescription or non-prescription medications, including herbals, vitamins, non-steroidal anti-inflammatory drugs (NSAIDs) and supplements.  This website has more information on Xarelto: [email or web address]

## 2021-05-15 NOTE — Progress Notes (Signed)
Occupational Therapy Treatment Patient Details Name: [name] MRN: [id number] DOB: Sep 06, 1941 Today's Date: 05/15/2021    History of present illness 80 y.o. 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 8/5 abdominal aortogram with BLE runoff. 8/10 3rd toe amputation and fem-pop bypass. 8/11 COVID (+). 8/18 L transmet amputation PMH: multiple LE vascular surgeries, rt transmetatarsal amputation, HTN, CABG, DM, PVD, diabetic neuropathy   OT comments  Pt progressing well towards OT goals. Pt with discomfort in L LE during mobility though still able to complete with Supervision using RW. Guided pt in sponge bathing task at sink in prep for DC with no more than Supervision assist required. Pt with good problem solving of compensatory strategies, as well as safety awareness. Continue to rec HHOT to maximize safety/independence with ADL routine in home environment.    Follow Up Recommendations  Home health OT;Supervision - Intermittent    Equipment Recommendations  None recommended by OT    Recommendations for Other Services      Precautions / Restrictions Precautions Precautions: Fall Required Braces or Orthoses: Other Brace Other Brace: Darco shoe on L and orthopedic shoe on R Restrictions Weight Bearing Restrictions: Yes LLE Weight Bearing: Partial weight bearing Other Position/Activity Restrictions: weight bearing on heel       Mobility Bed Mobility Overal bed mobility: Modified Independent Bed Mobility: Supine to Sit     Supine to sit: Modified independent (Device/Increase time);HOB elevated     General bed mobility comments: increased time, no physical assist given    Transfers Overall transfer level: Needs assistance Equipment used: Rolling walker (2 wheeled) Transfers: Sit to/from Stand Sit to Stand: Supervision         General transfer comment: Supervision, no physical assist needed to power up from bedside  or chair at sink (without armrests)    Balance Overall balance assessment: Needs assistance Sitting-balance support: Feet supported Sitting balance-Leahy Scale: Normal     Standing balance support: Bilateral upper extremity supported;During functional activity;Single extremity supported Standing balance-Leahy Scale: Poor Standing balance comment: reliant on at least single UE, BUE for gait                           ADL either performed or assessed with clinical judgement   ADL Overall ADL's : Needs assistance/impaired     Grooming: Modified independent;Sitting;Wash/dry face   Upper Body Bathing: Modified independent;Sitting   Lower Body Bathing: Supervison/ safety;Sit to/from stand Lower Body Bathing Details (indicate cue type and reason): for safety Upper Body Dressing : Modified independent;Sitting   Lower Body Dressing: Minimal assistance;Sitting/lateral leans Lower Body Dressing Details (indicate cue type and reason): able to don R darco shoe but required assistance with the L (top strap only). Pt reports L LE swelling and unable to successfully cross LEs at this time. Pt independently problem solved likely able to don L shoe if propped up on a stool as this is what she did with R LE to start with             Functional mobility during ADLs: Supervision/safety;Rolling walker General ADL Comments: Pt with minor pain with mobilty though able to walk to/from sink in room using RW and complete sponge bathing task without physical assist. Due to environmental limitations, still requires Min A for darco shoe mgmt though anticipate once using step stool at home to prop LE up - pt will be able to manage task  Vision   Vision Assessment?: No apparent visual deficits   Perception     Praxis      Cognition Arousal/Alertness: Awake/alert Behavior During Therapy: WFL for tasks assessed/performed Overall Cognitive Status: Within Functional Limits for tasks  assessed                                          Exercises     Shoulder Instructions       General Comments      Pertinent Vitals/ Pain       Pain Assessment: Faces Faces Pain Scale: Hurts little more Pain Location: L foot Pain Descriptors / Indicators: Grimacing;Guarding;Sore Pain Intervention(s): Monitored during session  Home Living                                          Prior Functioning/Environment              Frequency  Min 2X/week        Progress Toward Goals  OT Goals(current goals can now be found in the care plan section)  Progress towards OT goals: Progressing toward goals  Acute Rehab OT Goals Patient Stated Goal: Improve LLE pain OT Goal Formulation: With patient Time For Goal Achievement: 05/18/21 Potential to Achieve Goals: Good ADL Goals Pt Will Perform Grooming: with modified independence;standing Pt Will Perform Lower Body Dressing: with modified independence;sit to/from stand Pt Will Transfer to Toilet: with modified independence;ambulating  Plan Discharge plan remains appropriate    Co-evaluation                 AM-PAC OT "6 Clicks" Daily Activity     Outcome Measure   Help from another person eating meals?: None Help from another person taking care of personal grooming?: None Help from another person toileting, which includes using toliet, bedpan, or urinal?: A Little Help from another person bathing (including washing, rinsing, drying)?: A Little Help from another person to put on and taking off regular upper body clothing?: None Help from another person to put on and taking off regular lower body clothing?: A Little 6 Click Score: 21    End of Session Equipment Utilized During Treatment: Rolling walker  OT Visit Diagnosis: Unsteadiness on feet (R26.81);Other abnormalities of gait and mobility (R26.89)   Activity Tolerance Patient tolerated treatment well   Patient Left in  chair;with call bell/phone within reach   Nurse Communication Mobility status;Other (comment);Weight bearing status (may need assist with footrest level on recliner)        Time: 1062-6948 OT Time Calculation (min): 33 min  Charges: OT General Charges $OT Visit: 1 Visit OT Treatments $Self Care/Home Management : 23-37 mins  Malachy Chamber, OTR/L Acute Rehab Services Office: [phone]    Layla Maw 05/15/2021, 12:32 PM

## 2021-05-15 NOTE — Progress Notes (Signed)
PT Cancellation Note  Patient Details Name: ELEXUS BARMAN MRN: [id number] DOB: 1940/10/31   Cancelled Treatment:    Reason Eval/Treat Not Completed: Fatigue/lethargy limiting ability to participate. Patient had wound vac placed this am, requesting to just rest today. Politely declines PT at this time. Will re-attempt tomorrow. RN aware.    Millard Bautch 05/15/2021, 10:26 AM

## 2021-05-16 DIAGNOSIS — R262 Difficulty in walking, not elsewhere classified: Secondary | ICD-10-CM | POA: Diagnosis not present

## 2021-05-16 DIAGNOSIS — M86172 Other acute osteomyelitis, left ankle and foot: Secondary | ICD-10-CM | POA: Diagnosis not present

## 2021-05-16 DIAGNOSIS — Z7984 Long term (current) use of oral hypoglycemic drugs: Secondary | ICD-10-CM | POA: Diagnosis not present

## 2021-05-16 DIAGNOSIS — Z4801 Encounter for change or removal of surgical wound dressing: Secondary | ICD-10-CM | POA: Diagnosis not present

## 2021-05-16 DIAGNOSIS — D638 Anemia in other chronic diseases classified elsewhere: Secondary | ICD-10-CM | POA: Diagnosis not present

## 2021-05-16 DIAGNOSIS — I10 Essential (primary) hypertension: Secondary | ICD-10-CM | POA: Diagnosis not present

## 2021-05-16 DIAGNOSIS — I70202 Unspecified atherosclerosis of native arteries of extremities, left leg: Secondary | ICD-10-CM | POA: Diagnosis not present

## 2021-05-16 DIAGNOSIS — Z89422 Acquired absence of other left toe(s): Secondary | ICD-10-CM | POA: Diagnosis not present

## 2021-05-16 DIAGNOSIS — S90822D Blister (nonthermal), left foot, subsequent encounter: Secondary | ICD-10-CM | POA: Diagnosis not present

## 2021-05-16 DIAGNOSIS — Z89412 Acquired absence of left great toe: Secondary | ICD-10-CM | POA: Diagnosis not present

## 2021-05-16 DIAGNOSIS — U071 COVID-19: Secondary | ICD-10-CM | POA: Diagnosis not present

## 2021-05-16 DIAGNOSIS — M6281 Muscle weakness (generalized): Secondary | ICD-10-CM | POA: Diagnosis not present

## 2021-05-16 DIAGNOSIS — Z4781 Encounter for orthopedic aftercare following surgical amputation: Secondary | ICD-10-CM | POA: Diagnosis not present

## 2021-05-16 DIAGNOSIS — E11628 Type 2 diabetes mellitus with other skin complications: Secondary | ICD-10-CM | POA: Diagnosis not present

## 2021-05-16 DIAGNOSIS — L03116 Cellulitis of left lower limb: Secondary | ICD-10-CM | POA: Diagnosis not present

## 2021-05-17 DIAGNOSIS — L03116 Cellulitis of left lower limb: Secondary | ICD-10-CM | POA: Diagnosis not present

## 2021-05-17 DIAGNOSIS — D638 Anemia in other chronic diseases classified elsewhere: Secondary | ICD-10-CM | POA: Diagnosis not present

## 2021-05-17 DIAGNOSIS — I70202 Unspecified atherosclerosis of native arteries of extremities, left leg: Secondary | ICD-10-CM | POA: Diagnosis not present

## 2021-05-17 DIAGNOSIS — I10 Essential (primary) hypertension: Secondary | ICD-10-CM | POA: Diagnosis not present

## 2021-05-17 DIAGNOSIS — M86172 Other acute osteomyelitis, left ankle and foot: Secondary | ICD-10-CM | POA: Diagnosis not present

## 2021-05-17 DIAGNOSIS — E11628 Type 2 diabetes mellitus with other skin complications: Secondary | ICD-10-CM | POA: Diagnosis not present

## 2021-05-18 DIAGNOSIS — E11628 Type 2 diabetes mellitus with other skin complications: Secondary | ICD-10-CM | POA: Diagnosis not present

## 2021-05-18 DIAGNOSIS — M86172 Other acute osteomyelitis, left ankle and foot: Secondary | ICD-10-CM | POA: Diagnosis not present

## 2021-05-18 DIAGNOSIS — I70202 Unspecified atherosclerosis of native arteries of extremities, left leg: Secondary | ICD-10-CM | POA: Diagnosis not present

## 2021-05-18 DIAGNOSIS — L03116 Cellulitis of left lower limb: Secondary | ICD-10-CM | POA: Diagnosis not present

## 2021-05-18 DIAGNOSIS — D638 Anemia in other chronic diseases classified elsewhere: Secondary | ICD-10-CM | POA: Diagnosis not present

## 2021-05-18 DIAGNOSIS — I10 Essential (primary) hypertension: Secondary | ICD-10-CM | POA: Diagnosis not present

## 2021-05-19 DIAGNOSIS — I10 Essential (primary) hypertension: Secondary | ICD-10-CM | POA: Diagnosis not present

## 2021-05-19 DIAGNOSIS — E11628 Type 2 diabetes mellitus with other skin complications: Secondary | ICD-10-CM | POA: Diagnosis not present

## 2021-05-19 DIAGNOSIS — Z89432 Acquired absence of left foot: Secondary | ICD-10-CM | POA: Diagnosis not present

## 2021-05-19 DIAGNOSIS — I70202 Unspecified atherosclerosis of native arteries of extremities, left leg: Secondary | ICD-10-CM | POA: Diagnosis not present

## 2021-05-19 DIAGNOSIS — L03116 Cellulitis of left lower limb: Secondary | ICD-10-CM | POA: Diagnosis not present

## 2021-05-19 DIAGNOSIS — L98421 Non-pressure chronic ulcer of back limited to breakdown of skin: Secondary | ICD-10-CM | POA: Diagnosis not present

## 2021-05-19 DIAGNOSIS — D638 Anemia in other chronic diseases classified elsewhere: Secondary | ICD-10-CM | POA: Diagnosis not present

## 2021-05-19 DIAGNOSIS — D509 Iron deficiency anemia, unspecified: Secondary | ICD-10-CM | POA: Diagnosis not present

## 2021-05-19 DIAGNOSIS — M86172 Other acute osteomyelitis, left ankle and foot: Secondary | ICD-10-CM | POA: Diagnosis not present

## 2021-05-22 DIAGNOSIS — L03116 Cellulitis of left lower limb: Secondary | ICD-10-CM | POA: Diagnosis not present

## 2021-05-22 DIAGNOSIS — M86172 Other acute osteomyelitis, left ankle and foot: Secondary | ICD-10-CM | POA: Diagnosis not present

## 2021-05-22 DIAGNOSIS — I70202 Unspecified atherosclerosis of native arteries of extremities, left leg: Secondary | ICD-10-CM | POA: Diagnosis not present

## 2021-05-22 DIAGNOSIS — E11628 Type 2 diabetes mellitus with other skin complications: Secondary | ICD-10-CM | POA: Diagnosis not present

## 2021-05-22 DIAGNOSIS — I10 Essential (primary) hypertension: Secondary | ICD-10-CM | POA: Diagnosis not present

## 2021-05-22 DIAGNOSIS — D638 Anemia in other chronic diseases classified elsewhere: Secondary | ICD-10-CM | POA: Diagnosis not present

## 2021-05-24 DIAGNOSIS — I10 Essential (primary) hypertension: Secondary | ICD-10-CM | POA: Diagnosis not present

## 2021-05-24 DIAGNOSIS — L03116 Cellulitis of left lower limb: Secondary | ICD-10-CM | POA: Diagnosis not present

## 2021-05-24 DIAGNOSIS — I70202 Unspecified atherosclerosis of native arteries of extremities, left leg: Secondary | ICD-10-CM | POA: Diagnosis not present

## 2021-05-24 DIAGNOSIS — D638 Anemia in other chronic diseases classified elsewhere: Secondary | ICD-10-CM | POA: Diagnosis not present

## 2021-05-24 DIAGNOSIS — E11628 Type 2 diabetes mellitus with other skin complications: Secondary | ICD-10-CM | POA: Diagnosis not present

## 2021-05-24 DIAGNOSIS — M86172 Other acute osteomyelitis, left ankle and foot: Secondary | ICD-10-CM | POA: Diagnosis not present

## 2021-05-25 DIAGNOSIS — L03116 Cellulitis of left lower limb: Secondary | ICD-10-CM | POA: Diagnosis not present

## 2021-05-25 DIAGNOSIS — I70202 Unspecified atherosclerosis of native arteries of extremities, left leg: Secondary | ICD-10-CM | POA: Diagnosis not present

## 2021-05-25 DIAGNOSIS — D638 Anemia in other chronic diseases classified elsewhere: Secondary | ICD-10-CM | POA: Diagnosis not present

## 2021-05-25 DIAGNOSIS — E11628 Type 2 diabetes mellitus with other skin complications: Secondary | ICD-10-CM | POA: Diagnosis not present

## 2021-05-25 DIAGNOSIS — I10 Essential (primary) hypertension: Secondary | ICD-10-CM | POA: Diagnosis not present

## 2021-05-25 DIAGNOSIS — M86172 Other acute osteomyelitis, left ankle and foot: Secondary | ICD-10-CM | POA: Diagnosis not present

## 2021-05-26 DIAGNOSIS — L03116 Cellulitis of left lower limb: Secondary | ICD-10-CM | POA: Diagnosis not present

## 2021-05-26 DIAGNOSIS — E11628 Type 2 diabetes mellitus with other skin complications: Secondary | ICD-10-CM | POA: Diagnosis not present

## 2021-05-26 DIAGNOSIS — M86172 Other acute osteomyelitis, left ankle and foot: Secondary | ICD-10-CM | POA: Diagnosis not present

## 2021-05-26 DIAGNOSIS — I70202 Unspecified atherosclerosis of native arteries of extremities, left leg: Secondary | ICD-10-CM | POA: Diagnosis not present

## 2021-05-26 DIAGNOSIS — D638 Anemia in other chronic diseases classified elsewhere: Secondary | ICD-10-CM | POA: Diagnosis not present

## 2021-05-26 DIAGNOSIS — I10 Essential (primary) hypertension: Secondary | ICD-10-CM | POA: Diagnosis not present

## 2021-05-29 DIAGNOSIS — I70202 Unspecified atherosclerosis of native arteries of extremities, left leg: Secondary | ICD-10-CM | POA: Diagnosis not present

## 2021-05-29 DIAGNOSIS — I10 Essential (primary) hypertension: Secondary | ICD-10-CM | POA: Diagnosis not present

## 2021-05-29 DIAGNOSIS — E11628 Type 2 diabetes mellitus with other skin complications: Secondary | ICD-10-CM | POA: Diagnosis not present

## 2021-05-29 DIAGNOSIS — M86172 Other acute osteomyelitis, left ankle and foot: Secondary | ICD-10-CM | POA: Diagnosis not present

## 2021-05-29 DIAGNOSIS — D638 Anemia in other chronic diseases classified elsewhere: Secondary | ICD-10-CM | POA: Diagnosis not present

## 2021-05-29 DIAGNOSIS — L03116 Cellulitis of left lower limb: Secondary | ICD-10-CM | POA: Diagnosis not present

## 2021-05-31 DIAGNOSIS — E11628 Type 2 diabetes mellitus with other skin complications: Secondary | ICD-10-CM | POA: Diagnosis not present

## 2021-05-31 DIAGNOSIS — I70202 Unspecified atherosclerosis of native arteries of extremities, left leg: Secondary | ICD-10-CM | POA: Diagnosis not present

## 2021-05-31 DIAGNOSIS — I10 Essential (primary) hypertension: Secondary | ICD-10-CM | POA: Diagnosis not present

## 2021-05-31 DIAGNOSIS — L03116 Cellulitis of left lower limb: Secondary | ICD-10-CM | POA: Diagnosis not present

## 2021-05-31 DIAGNOSIS — D638 Anemia in other chronic diseases classified elsewhere: Secondary | ICD-10-CM | POA: Diagnosis not present

## 2021-05-31 DIAGNOSIS — M86172 Other acute osteomyelitis, left ankle and foot: Secondary | ICD-10-CM | POA: Diagnosis not present

## 2021-06-01 ENCOUNTER — Other Ambulatory Visit: Payer: Self-pay

## 2021-06-01 DIAGNOSIS — I70235 Atherosclerosis of native arteries of right leg with ulceration of other part of foot: Secondary | ICD-10-CM

## 2021-06-02 DIAGNOSIS — E11628 Type 2 diabetes mellitus with other skin complications: Secondary | ICD-10-CM | POA: Diagnosis not present

## 2021-06-02 DIAGNOSIS — I10 Essential (primary) hypertension: Secondary | ICD-10-CM | POA: Diagnosis not present

## 2021-06-02 DIAGNOSIS — M86172 Other acute osteomyelitis, left ankle and foot: Secondary | ICD-10-CM | POA: Diagnosis not present

## 2021-06-02 DIAGNOSIS — I70202 Unspecified atherosclerosis of native arteries of extremities, left leg: Secondary | ICD-10-CM | POA: Diagnosis not present

## 2021-06-02 DIAGNOSIS — L03116 Cellulitis of left lower limb: Secondary | ICD-10-CM | POA: Diagnosis not present

## 2021-06-02 DIAGNOSIS — D638 Anemia in other chronic diseases classified elsewhere: Secondary | ICD-10-CM | POA: Diagnosis not present

## 2021-06-05 DIAGNOSIS — I70202 Unspecified atherosclerosis of native arteries of extremities, left leg: Secondary | ICD-10-CM | POA: Diagnosis not present

## 2021-06-05 DIAGNOSIS — L03116 Cellulitis of left lower limb: Secondary | ICD-10-CM | POA: Diagnosis not present

## 2021-06-05 DIAGNOSIS — D638 Anemia in other chronic diseases classified elsewhere: Secondary | ICD-10-CM | POA: Diagnosis not present

## 2021-06-05 DIAGNOSIS — E11628 Type 2 diabetes mellitus with other skin complications: Secondary | ICD-10-CM | POA: Diagnosis not present

## 2021-06-05 DIAGNOSIS — M86172 Other acute osteomyelitis, left ankle and foot: Secondary | ICD-10-CM | POA: Diagnosis not present

## 2021-06-05 DIAGNOSIS — I10 Essential (primary) hypertension: Secondary | ICD-10-CM | POA: Diagnosis not present

## 2021-06-05 NOTE — Progress Notes (Signed)
POST OPERATIVE OFFICE NOTE    CC:  F/u for surgery  HPI:  This is a 80 y.o. 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 Stanford Breed.   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 Donzetta Matters.  She was discharged on 05/15/2021 on Xarelto 2.5mg  bid and wound vac to the left foot.  She did have doppler signals present left PT/AT/peroneal.   Pt returns today for follow up.  Pt states she is doing well.  She is tolerating the vac. She says she has an area on her heel that is due to the shoe but they have been putting a piece of foam to protect it.  She states it is not getting any worse.  She states that her right foot is doing well with only occasional nerve pains.  She denies any fevers.   Allergies  Allergen Reactions   Bactrim [Sulfamethoxazole-Trimethoprim] Other (See Comments)    ^ K+( elevated)    Demerol [Meperidine]     Delusional    Scopolamine     Delusional    Tramadol Other (See Comments)    Keeps awake   Versed [Midazolam] Anxiety    Frantic, out of my mind, agitated     Current Outpatient Medications  Medication Sig Dispense Refill   acetaminophen (TYLENOL) 500 MG tablet Take 1,000 mg by mouth every 6 (six) hours as needed for moderate pain or headache.     Ascorbic Acid (VITAMIN C) 100 MG tablet Take 100 mg by mouth daily with supper.      aspirin EC 81 MG tablet Take 81 mg by mouth at bedtime.      atorvastatin (LIPITOR) 80 MG tablet Take 80 mg by mouth daily.     brimonidine (ALPHAGAN) 0.2 % ophthalmic solution Place 1 drop into the right eye 2 (two) times daily.     canagliflozin (INVOKANA) 300 MG TABS tablet Take 300 mg by mouth daily before breakfast.     CINNAMON PO Take 2 tablets by mouth in the morning, at noon, and at bedtime.       glimepiride (AMARYL) 2 MG tablet Take 1 tablet (2 mg total) by mouth daily before breakfast. 30 tablet 3   glucose blood (ONETOUCH VERIO) test strip Use to check blood sugars once daily 100 each 5   hydrochlorothiazide (HYDRODIURIL) 12.5 MG tablet Take 1 tablet by mouth once daily (Patient taking differently: Take 12.5 mg by mouth daily.) 90 tablet 0   Lancets (ONETOUCH DELICA PLUS PPIRJJ88C) MISC Use to check blood sugars twice daily. 100 each 5   metFORMIN (GLUCOPHAGE) 1000 MG tablet Take 1 tablet by mouth twice daily (Patient taking differently: Take 1,000 mg by mouth 2 (two) times daily with a meal.) 180 tablet 3   metoprolol tartrate (LOPRESSOR) 25 MG tablet Take 1.5 tablets (37.5 mg total) by mouth 2 (two) times daily. 270 tablet 3   oxyCODONE (OXY IR/ROXICODONE) 5 MG immediate release tablet Take 1-2 tablets (5-10 mg total) by mouth every 4 (four) hours as needed for moderate pain. 20 tablet 0   pantoprazole (PROTONIX) 40 MG tablet Take 1 tablet (40 mg total) by mouth daily. (Patient taking differently: Take 40 mg by mouth See admin instructions. Takes 40 mg 3 times  a week M- W - F) 90 tablet 1   Polyethyl Glycol-Propyl Glycol (SYSTANE OP) Place 1 drop into the left eye 2 (two) times daily as needed (Dry eyes).     Probiotic Product ([employer] SENIOR PROBIOTIC PO) Take 1 capsule by mouth daily with breakfast.     pyridoxine (B-6) 100 MG tablet Take 100 mg by mouth every evening.      rivaroxaban (XARELTO) 2.5 MG TABS tablet Take 1 tablet (2.5 mg total) by mouth 2 (two) times daily. 60 tablet 0   telmisartan (MICARDIS) 80 MG tablet Take 1 tablet by mouth once daily (Patient taking differently: Take 80 mg by mouth daily.) 90 tablet 3   vitamin B-12 (CYANOCOBALAMIN) 500 MCG tablet Take 500 mcg by mouth daily.     Current Facility-Administered Medications  Medication Dose Route Frequency Provider Last Rate Last Admin   sodium chloride flush (NS) 0.9 % injection 3 mL  3 mL Intravenous Q12H [name], MD         ROS:  See HPI  Physical Exam:  Today's Vitals   06/06/21 1351  BP: (!) 154/69  Pulse: 93  Resp: 20  Temp: 98 F (36.7 C)  TempSrc: Temporal  SpO2: 98%  Weight: 135 lb 12.8 oz (61.6 kg)  Height: 5' 4.5" (1.638 m)   Body mass index is 22.95 kg/m.   Incision:  left groin and left lower leg incisions are healing nicely. Left TMA looks good and has nicely progressed compared to picture on 8/22.   Left heel   Extremities:  brisk doppler flow left peroneal>DP/PT and right monophasic peroneal and brisk biphasic AT.  Right TMA has healed nicely.     Non-invasive vascular studies: ABI 06/06/2021: +-------+-----------+-----------+------------+------------+  ABI/TBIToday's ABIToday's TBIPrevious ABIPrevious TBI  +-------+-----------+-----------+------------+------------+  Right  0.47       amputation 0.84        amputation    +-------+-----------+-----------+------------+------------+  Left   0.82       amputation 0.59        0.24          +-------+-----------+-----------+------------+------------+  LLE arterial duplex 06/06/2021: +----------+--------+-----+--------+--------+--------+  LEFT      PSV cm/sRatioStenosisWaveformComments  +----------+--------+-----+--------+--------+--------+  CFA Distal281                  biphasic          +----------+--------+-----+--------+--------+--------+      Left Graft #1: Fem-pop bypass graft  +--------------------+--------+---------------+----------+--------+                      PSV cm/sStenosis       Waveform  Comments  +--------------------+--------+---------------+----------+--------+  Inflow              161                    biphasic            +--------------------+--------+---------------+----------+--------+  Proximal Anastomosis323                    monophasic          +--------------------+--------+---------------+----------+--------+  Proximal  Graft      113                    monophasic          +--------------------+--------+---------------+----------+--------+  Mid Graft           69  monophasic          +--------------------+--------+---------------+----------+--------+  Distal Graft        58                     monophasic          +--------------------+--------+---------------+----------+--------+  Distal Anastomosis  82                     monophasic          +--------------------+--------+---------------+----------+--------+  Outflow             571     75-99% stenosismonophasic          +--------------------+--------+---------------+----------+--------+   Summary:  Left: 75-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 Stenosis at the  outflow of the left fem-pop bypass graft.   Assessment/Plan:  This is a 80 y.o. 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 Stanford Breed.   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 Donzetta Matters.    -pt's TMA healing nicely.  Continue wound vac.  Wet to dry dressing placed today. -ABI significantly improved from previous ABI on the left.  She has brisk doppler flow left foot and right AT and monophasic peroneal.   -continue to protect the left heel with foam with darco shoe. -she will f/u in 6 weeks for wound check and again in 3 months with repeat ABI and LLE duplex with Dr. Stanford Breed to discuss RLE PAD as well as wound check. She does have an elevated velocity at the outflow of the LLE bypass.  I discussed this with Dr. Stanford Breed and given she had ischemia of the RLE after her last angiogram, will hold off on this until she has healed more.   -She knows to call sooner if she has any issues.    Leontine Locket, [facility] [phone]   Clinic MD:  Stanford Breed

## 2021-06-06 ENCOUNTER — Ambulatory Visit (HOSPITAL_COMMUNITY)
Admission: RE | Admit: 2021-06-06 | Discharge: 2021-06-06 | Disposition: A | Discharge status: Home / Self Care | Payer: Medicare Other | Source: Ambulatory Visit | Attending: Vascular Surgery | Admitting: Vascular Surgery

## 2021-06-06 ENCOUNTER — Ambulatory Visit (INDEPENDENT_AMBULATORY_CARE_PROVIDER_SITE_OTHER)
Admission: RE | Admit: 2021-06-06 | Discharge: 2021-06-06 | Disposition: A | Discharge status: Home / Self Care | Payer: Medicare Other | Source: Ambulatory Visit | Attending: Vascular Surgery | Admitting: Vascular Surgery

## 2021-06-06 ENCOUNTER — Ambulatory Visit (INDEPENDENT_AMBULATORY_CARE_PROVIDER_SITE_OTHER): Payer: Medicare Other | Admitting: Physician Assistant

## 2021-06-06 ENCOUNTER — Other Ambulatory Visit: Payer: Self-pay

## 2021-06-06 VITALS — BP 154/69 | HR 93 | Temp 98.0°F | Resp 20 | Ht 64.5 in | Wt 135.8 lb

## 2021-06-06 DIAGNOSIS — I70235 Atherosclerosis of native arteries of right leg with ulceration of other part of foot: Secondary | ICD-10-CM

## 2021-06-07 ENCOUNTER — Telehealth: Payer: Self-pay

## 2021-06-07 DIAGNOSIS — I70202 Unspecified atherosclerosis of native arteries of extremities, left leg: Secondary | ICD-10-CM | POA: Diagnosis not present

## 2021-06-07 DIAGNOSIS — L03116 Cellulitis of left lower limb: Secondary | ICD-10-CM | POA: Diagnosis not present

## 2021-06-07 DIAGNOSIS — I10 Essential (primary) hypertension: Secondary | ICD-10-CM | POA: Diagnosis not present

## 2021-06-07 DIAGNOSIS — M86172 Other acute osteomyelitis, left ankle and foot: Secondary | ICD-10-CM | POA: Diagnosis not present

## 2021-06-07 DIAGNOSIS — D638 Anemia in other chronic diseases classified elsewhere: Secondary | ICD-10-CM | POA: Diagnosis not present

## 2021-06-07 DIAGNOSIS — E11628 Type 2 diabetes mellitus with other skin complications: Secondary | ICD-10-CM | POA: Diagnosis not present

## 2021-06-07 LAB — FUNGAL ORGANISM REFLEX

## 2021-06-07 LAB — FUNGUS CULTURE RESULT

## 2021-06-07 LAB — FUNGUS CULTURE WITH STAIN

## 2021-06-07 NOTE — Telephone Encounter (Signed)
Lorriane Shire from [facility] calls today to report some yellow slough from patient's wound bed on the TMA site. They are going to continue the wound vac and update [place] with changes.

## 2021-06-08 ENCOUNTER — Other Ambulatory Visit: Payer: Self-pay | Admitting: Endocrinology

## 2021-06-08 ENCOUNTER — Other Ambulatory Visit: Payer: Self-pay

## 2021-06-08 DIAGNOSIS — I70235 Atherosclerosis of native arteries of right leg with ulceration of other part of foot: Secondary | ICD-10-CM

## 2021-06-09 ENCOUNTER — Telehealth: Payer: Self-pay

## 2021-06-09 DIAGNOSIS — I70202 Unspecified atherosclerosis of native arteries of extremities, left leg: Secondary | ICD-10-CM | POA: Diagnosis not present

## 2021-06-09 DIAGNOSIS — M86172 Other acute osteomyelitis, left ankle and foot: Secondary | ICD-10-CM | POA: Diagnosis not present

## 2021-06-09 DIAGNOSIS — E11628 Type 2 diabetes mellitus with other skin complications: Secondary | ICD-10-CM | POA: Diagnosis not present

## 2021-06-09 DIAGNOSIS — I10 Essential (primary) hypertension: Secondary | ICD-10-CM | POA: Diagnosis not present

## 2021-06-09 DIAGNOSIS — D638 Anemia in other chronic diseases classified elsewhere: Secondary | ICD-10-CM | POA: Diagnosis not present

## 2021-06-09 DIAGNOSIS — L03116 Cellulitis of left lower limb: Secondary | ICD-10-CM | POA: Diagnosis not present

## 2021-06-09 NOTE — Telephone Encounter (Signed)
Benjamine Mola from [facility] calls today to report on patient. Patient is s/p L TMA on 8/18 - was seen in office on 9/13 - apparently since that time she has developed another wound on the bottom of her foot that is now connected to the surgical wound and covered in yellow slough. No fever or chills. RN is concerned and would like patient to be seen prior to follow up. Placed patient on [facility] schedule.

## 2021-06-12 DIAGNOSIS — I70202 Unspecified atherosclerosis of native arteries of extremities, left leg: Secondary | ICD-10-CM | POA: Diagnosis not present

## 2021-06-12 DIAGNOSIS — M86172 Other acute osteomyelitis, left ankle and foot: Secondary | ICD-10-CM | POA: Diagnosis not present

## 2021-06-12 DIAGNOSIS — I10 Essential (primary) hypertension: Secondary | ICD-10-CM | POA: Diagnosis not present

## 2021-06-12 DIAGNOSIS — L03116 Cellulitis of left lower limb: Secondary | ICD-10-CM | POA: Diagnosis not present

## 2021-06-12 DIAGNOSIS — D638 Anemia in other chronic diseases classified elsewhere: Secondary | ICD-10-CM | POA: Diagnosis not present

## 2021-06-12 DIAGNOSIS — E11628 Type 2 diabetes mellitus with other skin complications: Secondary | ICD-10-CM | POA: Diagnosis not present

## 2021-06-12 NOTE — Progress Notes (Signed)
[facility] PROGRESS NOTE  ASSESSMENT / PLAN: [name] is a 80 y.o. 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 Maintained on Xarelto 2.5mg  PO QD / ASA. VAC therapy no longer necessary to foot.  She has unfortunately developed a new wound extending over the plantar surface of her foot with fibrinous exudate.  We will plan to treat this topically with Santyl.  Continue offloading the heel and local wound care.  Bypass appears patent by exam today.  Follow-up with me weekly until she can get into the wound care center.  SUBJECTIVE: Returns to clinic because of variance of foot.  She has developed a new wound extending from the middle of the TMA to the plantar surface of her foot.  She is otherwise doing quite well and has no complaints.  OBJECTIVE: BP (!) 141/61 (BP Location: Left Arm, Patient Position: Sitting, Cuff Size: Normal)   Pulse 85   Temp 98.5 F (36.9 C)   Resp 20   Ht 5' 4.5" (1.638 m)   Wt 140 lb (63.5 kg)   SpO2 97%   BMI 23.66 kg/m   No acute distress TMA is healing nicely with heaped up granulation tissue New wound extending from the mid portion of the TMA over the plantar surface of the foot with fibrinous exudate.  This does not probe to bone. Heel ulceration very superficial and appears to be healing. Nearly triphasic flow in the anterior tibial artery.  CBC Latest Ref Rng & Units 05/15/2021 05/14/2021 05/13/2021  WBC 4.0 - 10.5 K/uL 12.6(H) 14.1(H) 15.1(H)  Hemoglobin 12.0 - 15.0 g/dL 8.2(L) 8.0(L) 7.8(L)  Hematocrit 36.0 - 46.0 % 26.8(L) 25.4(L) 24.8(L)  Platelets 150 - 400 K/uL 517(H) 461(H) 432(H)     CMP Latest Ref Rng & Units 05/15/2021 05/13/2021 05/10/2021  Glucose 70 - 99 mg/dL 156(H) 134(H) 197(H)  BUN 8 - 23 mg/dL 8 11 14   Creatinine 0.44 - 1.00 mg/dL 0.80 0.75 0.92  Sodium 135 - 145 mmol/L 138 137 139  Potassium 3.5 - 5.1 mmol/L 3.6  3.4(L) 4.2  Chloride 98 - 111 mmol/L 105 108 111  CO2 22 - 32 mmol/L 27 20(L) 23  Calcium 8.9 - 10.3 mg/dL 8.9 8.8(L) 8.5(L)  Total Protein 6.5 - 8.1 g/dL - - -  Total Bilirubin 0.3 - 1.2 mg/dL - - -  Alkaline Phos 38 - 126 U/L - - -  AST 15 - 41 U/L - - -  ALT 0 - 44 U/L - - -    [name] N. Stanford Breed, MD [facility] Phone Number: [phone] 06/13/2021 12:35 PM

## 2021-06-13 ENCOUNTER — Other Ambulatory Visit: Payer: Self-pay

## 2021-06-13 ENCOUNTER — Ambulatory Visit (INDEPENDENT_AMBULATORY_CARE_PROVIDER_SITE_OTHER): Payer: Self-pay | Admitting: Vascular Surgery

## 2021-06-13 ENCOUNTER — Encounter: Payer: Self-pay | Admitting: Vascular Surgery

## 2021-06-13 VITALS — BP 141/61 | HR 85 | Temp 98.5°F | Resp 20 | Ht 64.5 in | Wt 140.0 lb

## 2021-06-13 DIAGNOSIS — I739 Peripheral vascular disease, unspecified: Secondary | ICD-10-CM

## 2021-06-13 MED ORDER — RIVAROXABAN 2.5 MG PO TABS: 2.5000 mg | ORAL_TABLET | Freq: Two times a day (BID) | ORAL | 0 refills | Status: DC

## 2021-06-13 MED ORDER — COLLAGENASE 250 UNIT/GM EX OINT: 1.0000 "application " | TOPICAL_OINTMENT | Freq: Every day | CUTANEOUS | 1 refills | Status: DC

## 2021-06-14 ENCOUNTER — Telehealth: Payer: Self-pay | Admitting: *Deleted

## 2021-06-14 DIAGNOSIS — M86172 Other acute osteomyelitis, left ankle and foot: Secondary | ICD-10-CM | POA: Diagnosis not present

## 2021-06-14 DIAGNOSIS — D638 Anemia in other chronic diseases classified elsewhere: Secondary | ICD-10-CM | POA: Diagnosis not present

## 2021-06-14 DIAGNOSIS — L03116 Cellulitis of left lower limb: Secondary | ICD-10-CM | POA: Diagnosis not present

## 2021-06-14 DIAGNOSIS — E11628 Type 2 diabetes mellitus with other skin complications: Secondary | ICD-10-CM | POA: Diagnosis not present

## 2021-06-14 DIAGNOSIS — I70202 Unspecified atherosclerosis of native arteries of extremities, left leg: Secondary | ICD-10-CM | POA: Diagnosis not present

## 2021-06-14 DIAGNOSIS — I10 Essential (primary) hypertension: Secondary | ICD-10-CM | POA: Diagnosis not present

## 2021-06-14 NOTE — Telephone Encounter (Signed)
Patient called stating that she has appointment at the [facility] tomorrow, 06/15/2021 as per Dr Mora Appl note only see him weekly until appointment.  Wound care center will follow.

## 2021-06-15 ENCOUNTER — Other Ambulatory Visit: Payer: Self-pay

## 2021-06-15 ENCOUNTER — Encounter (HOSPITAL_BASED_OUTPATIENT_CLINIC_OR_DEPARTMENT_OTHER): Payer: Medicare Other | Attending: Internal Medicine | Admitting: Internal Medicine

## 2021-06-15 DIAGNOSIS — I2510 Atherosclerotic heart disease of native coronary artery without angina pectoris: Secondary | ICD-10-CM | POA: Diagnosis not present

## 2021-06-15 DIAGNOSIS — L97428 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: Secondary | ICD-10-CM | POA: Insufficient documentation

## 2021-06-15 DIAGNOSIS — Z885 Allergy status to narcotic agent status: Secondary | ICD-10-CM | POA: Diagnosis not present

## 2021-06-15 DIAGNOSIS — Z7984 Long term (current) use of oral hypoglycemic drugs: Secondary | ICD-10-CM | POA: Diagnosis not present

## 2021-06-15 DIAGNOSIS — I10 Essential (primary) hypertension: Secondary | ICD-10-CM | POA: Diagnosis not present

## 2021-06-15 DIAGNOSIS — E11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: Secondary | ICD-10-CM | POA: Insufficient documentation

## 2021-06-15 DIAGNOSIS — L97528 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ity: Secondary | ICD-10-CM | POA: Insufficient documentation

## 2021-06-15 DIAGNOSIS — R262 Difficulty in walking, not elsewhere classified: Secondary | ICD-10-CM | POA: Diagnosis not present

## 2021-06-15 DIAGNOSIS — E11628 Type 2 diabetes mellitus with other skin complications: Secondary | ICD-10-CM | POA: Diagnosis not present

## 2021-06-15 DIAGNOSIS — Z89422 Acquired absence of other left toe(s): Secondary | ICD-10-CM | POA: Diagnosis not present

## 2021-06-15 DIAGNOSIS — L89620 Pressure ulcer of left heel, unstageable: Secondary | ICD-10-CM | POA: Diagnosis not present

## 2021-06-15 DIAGNOSIS — E11621 Type 2 diabetes mellitus with foot ulcer: Secondary | ICD-10-CM | POA: Insufficient documentation

## 2021-06-15 DIAGNOSIS — M86172 Other acute osteomyelitis, left ankle and foot: Secondary | ICD-10-CM | POA: Diagnosis not present

## 2021-06-15 DIAGNOSIS — Z888 Allergy status to other drugs, medicaments and biological substances status: Secondary | ICD-10-CM | POA: Diagnosis not present

## 2021-06-15 DIAGNOSIS — Z4781 Encounter for orthopedic aftercare following surgical amputation: Secondary | ICD-10-CM | POA: Diagnosis not present

## 2021-06-15 DIAGNOSIS — M6281 Muscle weakness (generalized): Secondary | ICD-10-CM | POA: Diagnosis not present

## 2021-06-15 DIAGNOSIS — I70202 Unspecified atherosclerosis of native arteries of extremities, left leg: Secondary | ICD-10-CM | POA: Diagnosis not present

## 2021-06-15 DIAGNOSIS — L03116 Cellulitis of left lower limb: Secondary | ICD-10-CM | POA: Diagnosis not present

## 2021-06-15 DIAGNOSIS — T8131XA Disruption of external operation (surgical) wound, not elsewhere classified, initial encounter: Secondary | ICD-10-CM | POA: Insufficient documentation

## 2021-06-15 DIAGNOSIS — L97522 Non-pressure chronic ulcer of other part of left foot with fat layer exposed: Secondary | ICD-10-CM | POA: Diagnosis not present

## 2021-06-15 DIAGNOSIS — D638 Anemia in other chronic diseases classified elsewhere: Secondary | ICD-10-CM | POA: Diagnosis not present

## 2021-06-15 DIAGNOSIS — E1142 Type 2 diabetes mellitus with diabetic polyneuropathy: Secondary | ICD-10-CM | POA: Insufficient documentation

## 2021-06-15 DIAGNOSIS — Z882 Allergy status to sulfonamides status: Secondary | ICD-10-CM | POA: Diagnosis not present

## 2021-06-15 DIAGNOSIS — Z951 Presence of aortocoronary bypass graft: Secondary | ICD-10-CM | POA: Insufficient documentation

## 2021-06-15 DIAGNOSIS — Y835 Amputation of limb(s) as the cause of abnormal reaction of the patient, or of later complication, without mention of misadventure at the time of the procedure: Secondary | ICD-10-CM | POA: Diagnosis not present

## 2021-06-15 DIAGNOSIS — Z4801 Encounter for change or removal of surgical wound dressing: Secondary | ICD-10-CM | POA: Diagnosis not present

## 2021-06-15 DIAGNOSIS — S90822D Blister (nonthermal), left foot, subsequent encounter: Secondary | ICD-10-CM | POA: Diagnosis not present

## 2021-06-15 DIAGNOSIS — Z89412 Acquired absence of left great toe: Secondary | ICD-10-CM | POA: Diagnosis not present

## 2021-06-15 DIAGNOSIS — U071 COVID-19: Secondary | ICD-10-CM | POA: Diagnosis not present

## 2021-06-15 NOTE — Progress Notes (Signed)
[name], [name] ([id number]) Visit Report for 06/15/2021 Abuse/Suicide Risk Screen Details Patient Name: Date of Service: [name] 06/15/2021 8:35 A M Medical Record Number: [id number] Patient Account Number: [id number] Date of Birth/Sex: Treating RN: 11-Mar-1941 (80 y.o. [name] Primary Care [name]: [name] Other Clinician: Referring [name]: Treating [name]/[name]: [name] in Treatment: 0 Abuse/Suicide Risk Screen Items Answer ABUSE RISK SCREEN: Has anyone close to you tried to hurt or harm you recentlyo No Do you feel uncomfortable with anyone in your familyo No Has anyone forced you do things that you didnt want to doo No Electronic Signature(s) Signed: 06/15/2021 5:17:03 PM By: [name] RN, BSN Entered By: [name] on 06/15/2021 08:42:21 -------------------------------------------------------------------------------- Activities of Daily Living Details Patient Name: Date of Service: [name] 06/15/2021 8:35 A M Medical Record Number: [id number] Patient Account Number: [id number] Date of Birth/Sex: Treating RN: 1940/10/11 (80 y.o. [name] Primary Care [name]: [name] Other Clinician: Referring [name]: Treating Nana Vastine/[name]: [name] in Treatment: 0 Activities of Daily Living Items Answer Activities of Daily Living (Please select one for each item) Drive Automobile Completely Able T Medications ake Completely Able Use T elephone Completely Able Care for Appearance Completely Able Use T oilet Completely Able Bath / Shower Completely Able Dress Self Completely Able Feed Self Completely Able Walk Completely Able Get In / Out Bed Completely Able Housework Completely Able Prepare Meals Completely Able Handle Money Completely Able Shop for Self Completely Able Electronic Signature(s) Signed: 06/15/2021 5:17:03 PM By: [name] RN, BSN Entered By: [name] on 06/15/2021 08:43:05 -------------------------------------------------------------------------------- Education Screening Details Patient Name: Date of Service: [name], [name]. 06/15/2021 8:35 A M Medical Record Number: [id number] Patient Account Number: [id number] Date of Birth/Sex: Treating RN: 05-04-1941 (80 y.o. [name] Primary Care [name]: [name] Other Clinician: Referring [name]: Treating [name]/[name]: [name] in Treatment: 0 Primary Learner Assessed: Patient Learning Preferences/Education Level/Primary Language Learning Preference: Explanation, Demonstration, Printed Material Highest Education Level: College or Above Preferred Language: English Cognitive Barrier Language Barrier: No Translator Needed: No Memory Deficit: No Emotional Barrier: No Cultural/Religious Beliefs Affecting Medical Care: No Physical Barrier Impaired Vision: No Impaired Hearing: No Decreased Hand dexterity: No Knowledge/Comprehension Knowledge Level: High Comprehension Level: High Ability to understand written instructions: High Ability to understand verbal instructions: High Motivation Anxiety Level: Calm Cooperation: Cooperative Education Importance: Acknowledges Need Interest in Health Problems: Asks Questions Perception: Coherent Willingness to Engage in Self-Management High Activities: Readiness to Engage in Self-Management High Activities: Electronic Signature(s) Signed: 06/15/2021 5:17:03 PM By: [name] RN, BSN Entered By: [name] on 06/15/2021 08:43:27 -------------------------------------------------------------------------------- Fall Risk Assessment Details Patient Name: Date of Service: [name], [name]. 06/15/2021 8:35 A M Medical Record Number: [id number] Patient Account Number: [id number] Date of Birth/Sex: Treating RN: 1941-07-30 (80 y.o. [name] Primary Care Jeris [name]: [name] Other Clinician: Referring [name]: Treating [name]/[name]: [name] in Treatment: 0 Fall Risk Assessment Items Have you had 2 or more falls in the last 12 monthso 0 No Have you had any fall that resulted in injury in the last 12 monthso 0 No FALLS RISK SCREEN History of falling - immediate or within 3 months 0 No Secondary diagnosis (Do you have 2 or more medical diagnoseso) 15 Yes Ambulatory aid None/bed rest/wheelchair/nurse 0 No Crutches/cane/walker 15 Yes Furniture 0 No Intravenous therapy Access/Saline/Heparin Lock 0 No Gait/Transferring Normal/  bed rest/ wheelchair 0 Yes Weak (short steps with or without shuffle, stooped but able to lift head while walking, may seek 0 No support from furniture) Impaired (short steps with shuffle, may have difficulty arising from chair, head down, impaired 0 No balance) Mental Status Oriented to own ability 0 Yes Electronic Signature(s) Signed: 06/15/2021 5:17:03 PM By: [name] RN, BSN Entered By: [name] on 06/15/2021 08:43:46 -------------------------------------------------------------------------------- Foot Assessment Details Patient Name: Date of Service: [name], [name]. 06/15/2021 8:35 A M Medical Record Number: [id number] Patient Account Number: [id number] Date of Birth/Sex: Treating RN: Nov 05, 1940 (80 y.o. [name] Primary Care [name]: [name] Other Clinician: Referring [name]: Treating [name]/[name]: [name] in Treatment: 0 Foot Assessment Items Site Locations + = Sensation present, - = Sensation absent, C = Callus, U = Ulcer R = Redness, W = Warmth, M = Maceration, PU = Pre-ulcerative lesion F = Fissure, S = Swelling, D = Dryness Assessment Right: Left: Other Deformity: No No Prior Foot Ulcer: No No Prior Amputation: No No Charcot Joint: No  No Ambulatory Status: Ambulatory With Help Assistance Device: Walker GaitEnergy manager) Signed: 06/15/2021 5:17:03 PM By: [name] RN, BSN Entered By: [name] on 06/15/2021 08:49:10 -------------------------------------------------------------------------------- Nutrition Risk Screening Details Patient Name: Date of Service: [name], [name] 06/15/2021 8:35 A M Medical Record Number: [id number] Patient Account Number: [id number] Date of Birth/Sex: Treating RN: 03/07/1941 (80 y.o. [name] Primary Care Annelyse Rey: [name] Other Clinician: Referring Ramiah Helfrich: Treating Elyshia Kumagai/[name]: [name] in Treatment: 0 Height (in): 64 Weight (lbs): 135 Body Mass Index (BMI): 23.2 Nutrition Risk Screening Items Score Screening NUTRITION RISK SCREEN: I have an illness or condition that made me change the kind and/or amount of food I eat 2 Yes I eat fewer than two meals per day 0 No I eat few fruits and vegetables, or milk products 0 No I have three or more drinks of beer, liquor or wine almost every day 0 No I have tooth or mouth problems that make it hard for me to eat 0 No I don't always have enough money to buy the food I need 0 No I eat alone most of the time 0 No I take three or more different prescribed or over-the-counter drugs a day 1 Yes Without wanting to, I have lost or gained 10 pounds in the last six months 0 No I am not always physically able to shop, cook and/or feed myself 0 No Nutrition Protocols Good Risk Protocol Moderate Risk Protocol 0 Provide education on nutrition High Risk Proctocol Risk Level: Moderate Risk Score: 3 Electronic Signature(s) Signed: 06/15/2021 5:17:03 PM By: [name] RN, BSN Entered By: [name] on 06/15/2021 08:43:55

## 2021-06-16 DIAGNOSIS — I70202 Unspecified atherosclerosis of native arteries of extremities, left leg: Secondary | ICD-10-CM | POA: Diagnosis not present

## 2021-06-16 DIAGNOSIS — I10 Essential (primary) hypertension: Secondary | ICD-10-CM | POA: Diagnosis not present

## 2021-06-16 DIAGNOSIS — M86172 Other acute osteomyelitis, left ankle and foot: Secondary | ICD-10-CM | POA: Diagnosis not present

## 2021-06-16 DIAGNOSIS — E11628 Type 2 diabetes mellitus with other skin complications: Secondary | ICD-10-CM | POA: Diagnosis not present

## 2021-06-16 DIAGNOSIS — D638 Anemia in other chronic diseases classified elsewhere: Secondary | ICD-10-CM | POA: Diagnosis not present

## 2021-06-16 DIAGNOSIS — L03116 Cellulitis of left lower limb: Secondary | ICD-10-CM | POA: Diagnosis not present

## 2021-06-19 DIAGNOSIS — E11628 Type 2 diabetes mellitus with other skin complications: Secondary | ICD-10-CM | POA: Diagnosis not present

## 2021-06-19 DIAGNOSIS — M86172 Other acute osteomyelitis, left ankle and foot: Secondary | ICD-10-CM | POA: Diagnosis not present

## 2021-06-19 DIAGNOSIS — I70202 Unspecified atherosclerosis of native arteries of extremities, left leg: Secondary | ICD-10-CM | POA: Diagnosis not present

## 2021-06-19 DIAGNOSIS — L03116 Cellulitis of left lower limb: Secondary | ICD-10-CM | POA: Diagnosis not present

## 2021-06-19 DIAGNOSIS — I10 Essential (primary) hypertension: Secondary | ICD-10-CM | POA: Diagnosis not present

## 2021-06-19 DIAGNOSIS — D638 Anemia in other chronic diseases classified elsewhere: Secondary | ICD-10-CM | POA: Diagnosis not present

## 2021-06-20 ENCOUNTER — Ambulatory Visit: Payer: Medicare Other

## 2021-06-21 DIAGNOSIS — E11628 Type 2 diabetes mellitus with other skin complications: Secondary | ICD-10-CM | POA: Diagnosis not present

## 2021-06-21 DIAGNOSIS — L03116 Cellulitis of left lower limb: Secondary | ICD-10-CM | POA: Diagnosis not present

## 2021-06-21 DIAGNOSIS — D638 Anemia in other chronic diseases classified elsewhere: Secondary | ICD-10-CM | POA: Diagnosis not present

## 2021-06-21 DIAGNOSIS — I70202 Unspecified atherosclerosis of native arteries of extremities, left leg: Secondary | ICD-10-CM | POA: Diagnosis not present

## 2021-06-21 DIAGNOSIS — I10 Essential (primary) hypertension: Secondary | ICD-10-CM | POA: Diagnosis not present

## 2021-06-21 DIAGNOSIS — M86172 Other acute osteomyelitis, left ankle and foot: Secondary | ICD-10-CM | POA: Diagnosis not present

## 2021-06-21 NOTE — Progress Notes (Signed)
[name] ([id number]) Visit Report for 06/15/2021 Debridement Details Patient Name: Date of Service: [name] 06/15/2021 8:35 A M Medical Record Number: [id number] Patient Account Number: [id number] Date of Birth/Sex: Treating RN: Jul 24, 1941 (80 y.o. [name], [name] Primary Care Provider: [name] Other Clinician: Referring Provider: Treating Provider/[name]: Tressie Stalker in Treatment: 0 Debridement Performed for Assessment: Wound #13 Left Amputation Site - Transmetatarsal Performed By: Physician Ricard Dillon., MD Debridement Type: Debridement Severity of Tissue Pre Debridement: Fat layer exposed Level of Consciousness (Pre-procedure): Awake and Alert Pre-procedure Verification/Time Out Yes - 09:03 Taken: Start Time: 09:03 T Area Debrided (L x W): otal 6.5 (cm) x 7.5 (cm) = 48.75 (cm) Tissue and other material debrided: Viable, Non-Viable, Eschar, Slough, Subcutaneous, Slough Level: Skin/Subcutaneous Tissue Debridement Description: Excisional Instrument: Curette Bleeding: Moderate Hemostasis Achieved: Pressure End Time: 09:05 Procedural Pain: 0 Post Procedural Pain: 0 Response to Treatment: Procedure was tolerated well Level of Consciousness (Post- Awake and Alert procedure): Post Debridement Measurements of Total Wound Length: (cm) 6.5 Width: (cm) 7.5 Depth: (cm) 0.9 Volume: (cm) 34.459 Character of Wound/Ulcer Post Debridement: Requires Further Debridement Severity of Tissue Post Debridement: Fat layer exposed Post Procedure Diagnosis Same as Pre-procedure Electronic Signature(s) Signed: 06/15/2021 4:58:28 PM By: [name] MD Signed: 06/21/2021 5:21:18 PM By: Rhae Hammock RN Entered By: [name] on 06/15/2021 09:18:13 -------------------------------------------------------------------------------- Debridement Details Patient Name: Date of Service: [name], [name]. 06/15/2021 8:35 A  M Medical Record Number: [id number] Patient Account Number: [id number] Date of Birth/Sex: Treating RN: January 06, 1941 (80 y.o. [name], [name] Primary Care Provider: [name] Other Clinician: Referring Provider: Treating Provider/[name]: Tressie Stalker in Treatment: 0 Debridement Performed for Assessment: Wound #14 Left,Posterior Calcaneus Performed By: Clinician [name], RN Debridement Type: Chemical/Enzymatic/Mechanical Agent Used: Santyl Severity of Tissue Pre Debridement: Fat layer exposed Level of Consciousness (Pre-procedure): Awake and Alert Pre-procedure Verification/Time Out Yes - 09:15 Taken: Start Time: 09:15 Bleeding: None Procedural Pain: 0 Post Procedural Pain: 0 Response to Treatment: Procedure was tolerated well Level of Consciousness (Post- Awake and Alert procedure): Post Debridement Measurements of Total Wound Length: (cm) 1.1 Width: (cm) 1.6 Depth: (cm) 0.1 Volume: (cm) 0.138 Character of Wound/Ulcer Post Debridement: Requires Further Debridement Severity of Tissue Post Debridement: Fat layer exposed Post Procedure Diagnosis Same as Pre-procedure Electronic Signature(s) Signed: 06/15/2021 4:58:28 PM By: [name] MD Signed: 06/21/2021 5:21:18 PM By: Rhae Hammock RN Entered By: [name] on 06/15/2021 09:18:22 -------------------------------------------------------------------------------- HPI Details Patient Name: Date of Service: [name], [name]. 06/15/2021 8:35 A M Medical Record Number: [id number] Patient Account Number: [id number] Date of Birth/Sex: Treating RN: 08-Aug-1941 (80 y.o. [name], [name] Primary Care Provider: [name] Other Clinician: Referring Provider: Treating Provider/[name]: Tressie Stalker in Treatment: 0 History of Present Illness HPI Description: ADMISSION 07/13/2019 Patient is a 80 year old type II diabetic. She has  known PAD. She has been followed by Dr. Jacqualyn Posey of podiatry for blistering areas on her toes dating back to 04/30/2019 which at this was the left fourth toe. By 8/11 she had wounds on the right and left second toes. She underwent arterial studies by Dr. Alvester Chou on 7/31 that showed ABIs on the right of 0.60 TBI of 0.26 on the left ABI of 0.56 and a TBI of 0.25. By 9/10 she had ischemic-looking wounds per Dr. Earleen Newport on the right first, left first second and third. She has been using Medihoney and then mupirocin more recently  simply Betadine. The patient underwent angiography by Dr. Gwenlyn Found on 9/21. 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 Noted that there was 1 vessel runoff in the foot via the peroneal. On the left there was an 80% left SFA, 70% mid left SFA. There was a short segment calcified CTO distal less than SFA and above-knee popliteal artery reconstituting with two-vessel runoff. 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 It was felt that she had bilateral SFA disease as well as tibial vessel disease. An attempt was made to revascularize the left SFA but they were unable to cross because of the highly calcified nature of the lesion. 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 She has an area on the medial left fourth toe with raised horned callus on top of this. I am not certain what this represents. With regards to pain she has about a 2-1/2 I will claudication tolerance in the grocery store. She has some pain in night which is relieved by putting her feet down over the side of the bed. She is wearing Nitro-Dur patches on the top of her right foot. Past medical history includes type 2 diabetes with secondary PAD, neoplasm of the skin, diabetic retinopathy,  carotid artery stenosis, hyperlipidemia hypertension. 11/2; the last time the patient was here I spoke to Dr. Gwenlyn Found about revascularization options on the right. As I understand things currently Dr. Gwenlyn Found spoke with Dr. Trula Slade and ultimately the patient was taken to surgery on 10/27. She had a right iliofemoral endarterectomy with a bovine patch angioplasty. I think the plan now is for her to have a staged intervention on the right SFA by Dr. Alvester Chou. Per the patient's understanding Dr. Gwenlyn Found and Dr. Jacqualyn Posey are waiting to see when the gangrenous toes on the right foot can be amputated. The patient states her pain is a lot better and she is grateful for that. 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 Small area on the left fourth toe. She is using Betadine to the mummified areas on the right and Medihoney on the left 08/27/2019; the last time I saw this patient I discharged her from the clinic. She had been revascularized by Dr. Trula Slade and she had a right iliofemoral endarterectomy with a bovine angioplasty. 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 Jacqualyn Posey of podiatry on 08/07/2019. I note that she also had a intervention by Dr. Gwenlyn Found and he performed a directional atherectomy and drug-coated balloon angioplasty of the SFA and popliteal artery on the right. I am not certain of the date of Dr. Kennon Holter procedure as of the time of this dictation. She was referred back to [place] by Dr. Earleen Newport predominantly for follow-up of the left fourth toe. She still has sutures and stitches in the right TMA site. She states her pain is a lot better. She expresses concern about the condition of the amputation site at the TMA. She is on doxycycline I think prescribed by Dr. Earleen Newport. She is complaining of some pain at night 12/10; I spoke to Dr. Jacqualyn Posey last week. He removed the sutures on the right foot on Monday of this week. She has the  area on the left fourth toe  just proximal to the PIP and then the right TMA site. She is still on doxycycline and has enough through next week. Unfortunately the TMA site does not look good at all. Both on the lateral and medial part of the incisions are areas that probe to bone. There is purulence over the medial part which I have cultured. We will use silver alginate. Left fourth toe looks somewhat better but there was still exposed bone 12/17; patient has an MRI booked for 12/30. Culture I did last week showed Pseudomonas Serratia and Enterococcus. This was purulent drainage coming out of the medial part of her amputation site. I use cefdinir 300 twice daily for 10 days that started on 12/15. Her x-ray on the right showed limited evaluation for osteomyelitis. The findings could have been postoperative. There was subtle erosion in the distal first and distal fifth metatarsal. An MRI was suggested. On the left she had irregularity of the fourth middle and proximal phalanx consistent with a history of osteomyelitis. I do not know that she has a history of osteomyelitis in this area. She had a newly defined area on the plantar third toe 12/31; patient's MRI is listed below: MRI OF THE RIGHT FOREFOOT WITHOUT AND WITH CONTRAST TECHNIQUE: Multiplanar, multisequence MR imaging of the right forefoot was performed before and after the administration of intravenous contrast. CONTRAST 6 mL GADAVIST IV SOLN : COMPARISON: Plain films right foot 09/04/2019. FINDINGS: Bones/Joint/Cartilage The patient is status post transmetatarsal amputation as seen on the prior exam. Marrow edema and enhancement are seen in all of the distal metatarsals. In the first metatarsal, signal change extends 1.5 cm proximal to the stump and in the second metatarsal extends approximately 2 cm proximal to the stump. Edema and enhancement are seen in only the distal 0.7 cm of the third metatarsal stump and tips of the fourth and  fifth metatarsals. Bone marrow signal is otherwise unremarkable. A small focus of subchondral edema is seen in the lateral talus, likely degenerative. Ligaments Intact. Muscles and Tendons No intramuscular fluid collection. Soft tissues Skin ulceration is seen off the stump of the first metatarsal. A thin fluid collection tracks deep to the wound and over the anterior metatarsals worrisome for small abscess. Intense subcutaneous edema and enhancement are seen diffusely. 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 Cellulitis about the foot. Skin ulceration over the distal first metatarsal is identified with a thin fluid collection tracking anteriorly along the stump worrisome for abscess. Small focus of subchondral edema in the lateral talus is likely degenerative. Electronically Signed By: Inge Rise M.D. On: 09/23/2019 15:25 Patient arrives in clinic today not complaining any of any pain. 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 She does not describe claudication however her activity is very limited. 10/01/2019; since the patient was last here I have communicated with Dr. Gwenlyn Found after bypass by Dr. Trula Slade and addressing the right superficial femoral artery he states that she is widely patent through peroneal arteries to the ankle with collaterals to the dorsalis pedis. He states he is going to talk to colleagues about the feasibility of tibial pedal access. The patient seems infectious disease later this afternoon Dr. Baxter Flattery. in preparation for this she has been off antibiotics for 1 week and I went ahead and obtain pieces of the remanence of her first metatarsal for  pathology and CandS. The patient is a candidate for hyperbaric oxygen. 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 1/15; considerable improvement in the wounds on her feet. We are using silver collagen. She follows with Dr. Baxter Flattery of infectious disease and is on meropenem and daptomycin. She has been taught how to do this herself at home. Follows with Dr. Graylon Good at the end of treatment here. She has 2 wounds on the surgical TMA site 1 lateral and 1 medial the lateral 1 has regressed tremendously. The area medially still has some exposed bone although the base of this looks healthy. 1/22; 2 separate wounds on the surgical TMA site. Both of these look satisfactory. The medial area does not have any exposed bone. This is an improvement On the left side fourth toe dorsally over the proximal phalanx there is a deep punched out area that probes to bone. She has an area on the tip of the left third toe. She also tells [place] that in HBO she traumatized her left Achilles and this is left her with a superficial wound area 1/28; weekly visit along with HBO. She has 5 wounds. T our punched out areas on the original TMA site on the right. Both of these appear to have contracted. o The area on the right no longer has exposed bone. She has an area on the tip of the left third toe and on the DIP of the left fourth toe. Both of these had surface callus that I removed and unfortunately they have small areas that both go to bone. She has a traumatic wound on the left Achilles area that happened in HBO and that appears better. She is tolerating her IV antibiotics well at home. She has home health changing the dressings and she is doing it once on the weekends. We have been using silver collagen. She has been extensively revascularized on the right by Dr. Trula Slade and subsequently by Dr. Gwenlyn Found. 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 Baxter Flattery of  infectious disease on 2/17. Dr. Gwenlyn Found on 2/23 and according to her her antibiotics stopped on 2/23. She is tolerating hyperbarics well. She has made a tremendous improvement in the right forefoot with only 2 smaller open wounds. Using silver collagen. On the left foot she has the area on the tip of the left third toe and the medial part of the left fourth toe. These had exposed bone last week I did not sense any of that today 2/12; sees Dr. Graylon Good on 2/17 and Dr. Gwenlyn Found on 2/23. We are going to use Dermagraft on this today however the lateral part of her train TMA incision on the right is healed and the medial part is down so much that we just continue with the silver collagen She has wounds on the tip of her left third and the medial aspect of the left fourth. Both of these still have exposed bone I have not been able to get these to epithelialize. 2/19; she sees Dr. Gwenlyn Found on 2/23 and I have communicated with him about the left vascular supply. Looking at her angiogram from 08/03/2019 it looks as though that they could not cross the left CFA. Noted that she had one-vessel runoff bilaterally. She also apparently saw Dr. Graylon Good although I did not look up these results for  preparation of this record. 2/26; Dr. Gwenlyn Found is going to do an angiogram next week on Wednesday. I think they are going to try to go at this both retrograde and anterograde to see if anything can be done to revascularize the left lower limb. She continues to make nice progress on the remaining wound on the right medial foot on her TMA site and the toe it wounds on the left are responding nicely as well 3/12; the patient underwent an extensive and complicated revascularization on the left leg by Dr. Fletcher Anon on 3/3; she had an atherectomy on occluded left popliteal artery; anterior tibial artery followed by a balloon angioplasty. Atherectomy was also performed to the left SFA because there was still significant 50% stenosis in the left  popliteal artery they performed an intravascular lithotripsy which improved the residual stenosis to 20%. The same lithotripsy was used to dilate the proximal left SFA. She had a drug-eluting stent placed I believe in the SFA. The patient returned for hyperbarics this week. She has had some eye problems on the right which she tells me are secondary to diabetic retinopathy and she saw her eye doctor. She is really made excellent progress there is no open wounds on the left foot at all. She has one open area medially and her TMA site on the right lateral wound is closed. 3/19; the patient comes in today with the area on the medial transmet on the right improved. Some debris removed from the surface revealed the still open wound. Unfortunately she now has the open area on the third toe tip and the medial aspect of the dorsal fourth toe. These are in the same location as her previous wounds. These had actually closed up last week. 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 She is undergone complex revascularizations on the right by Dr. Trula Slade and Dr. Gwenlyn Found and I believe Dr. 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 She completed IVantibiotics as directed by infectious disease and I believe a course of oral antibiotics as well.she underwent a complete 40 treatments of hyperbarics. She comes in today with the vast majority of the right TMA site healed there is a small opening on the most medial aspect. Her left third and left fourth toes appear to have epithelialization although this has fluctuated somewhat. 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 She had a gaping wound on the right transmetatarsal  amputation L as well as deep wounds on the left third and left fourth toes. She underwent complex revascularizations on her bilateral lower legs which I described on her last visit. She completed IV antibiotics by infectious disease and 40 treatments of hyperbarics. Her left third and fourth toes are healed. The area on the right transmetatarsal amputation site is also closed today. READMISSION 08/04/2020 Mrs. [name] is now a 80 year old woman that we had for a complex stay in our clinic from October 2000 14 January 2020. 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 Her current problems with wounds began on October 30 when she developed a spontaneous  opening over the right transmet medial aspect over the first metatarsal head. She has been using collagen on this that she had leftover from her last stay in this clinic. She also has had an open area on the medial right calf from a vein harvest site from her CABG she. She said that this is never really closed since the vein harvest was done. With regards to her arterial status this is a major issue. She had an angiogram by Dr. Gwenlyn Found in September 2020. She developed a gangrenous right first and second toes. Ultimately she was referred to Dr. Trula Slade who did a right common femoral endarterectomy with patch angioplasty. This was on 07/21/2019 and she really had a good result. Dr. Gwenlyn Found did a atherectomy followed by a drug coated balloon angioplasty of her right SFA popliteal and tibial peroneal trunk on 08/03/2019 again with excellent excellent results. Follow-up.Dopplers in November revealed revealed a widely patent SFA and tibioperoneal trunk. UNFORTUNATELY her recent angiogram that was done on 08/01/2020 showed an 80% right common femoral artery stenosis just above the previous endarterectomy site occluded right right  SFA at the origin reconstituting the adductor canal by the profunda femoris collaterals. The profunda femoris is also diffusely diseased. There is and again another 80% tibioperoneal trunk stenosis and one-vessel runoff via the peroneal artery. She is seen Dr. Donzetta Matters and she is scheduled for a femoral endarterectomy and right femoral-popliteal bypass grafting with endarterectomy of her tibial peroneal trunk. The scheduled surgery is on 11/23 The patient does not complain of pain at rest. She does have 5-minute walking claudication. The wounds on her right first met head remanent was spontaneous she does not think she hit this. As mentioned in the area on her right medial calf is a vein harvest site Dr. Gwenlyn Found had texted me about getting her in the clinic and we have arranged this as soon as we can. I have told her that we will have to see how successful Dr. Donzetta Matters is before we know what can be done about the wounds in a precise fashion. Fortunately there does not seem to be any obvious infection involving the right foot although that may need to be looked at if things really deteriorate. She was treated with IV antibiotics and hyperbaric oxygen for her previous osteomyelitis 12/2; the patient underwent a right femoral endarterectomy and bypass femoral to popliteal artery. This was done on 08/16/2020. She is done remarkably well. She has a small area on the right anterior tibia and a small area in the medial part of her original right transmetatarsal amputation. She appears to have had an excellent response to surgery. 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 [name] is a now 80 year old woman who arrives for review of wounds on the left TMA site extending into her plantar foot as well as an area on the left posterior calcaneus. We had her extensively in 2021 for a failed  right TMA in the setting of type 2 diabetes with angiopathy and underlying osteomyelitis. She was extensively revascularized on the right at that time as noted above in our previous notes ended up receiving IV antibiotics and hyperbaric oxygen therapy. Miraculously this TMA site actually closed and she really has had not any trouble with this since. UNFORTUNATELY the same cannot be said of her left side. Her problem apparently started sometime in July when she hit her  left third toe on a barstool while playing pool. She developed an acute infection which eventually led to underlying osteomyelitis. She underwent an extensive hospitalization from 04/27/2021 through 05/15/2021. She received IV antibiotics. Also extensive intervention by Dr. Stanford Breed of vein and vascular which included a left common femoral to tibioperoneal trunk bypass as well as a left third toe amputation on 05/03/2021. Unfortunately the area did not heal she ended up requiring a left TMA. By her description this was left open. She has been using a wound VAC on this to earlier this week and then was switched to Santyl. I think because of the left forefoot off loader she is also developed an area on the left posterior calcaneus. She is not complaining of a lot of pain. She has her daughter at home to change the dressings. She has been prescribed Santyl and she has a tube that she is brought into our clinic. I cannot get a lot of history of claudication although she is really not that mobile at this point walking with a walker. 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 She has had multiple interventions on the right side by vein and vascular and also by Dr. Gwenlyn Found as well as a transmetatarsal amputation on the right. She has had follow-up noninvasive studies on 06/06/2021; on the left that showed an ABI of  0.47 with monophasic waveforms a dorsalis pedis ABI of 0.40 with monophasic waveforms. Her ANGIOGRAM which was initially done by Dr. Oneida Alar on 04/28/2021 showed severe left foot superficial femoral artery occlusive disease and an occlusion of the left popliteal artery with two-vessel runoff to the left foot with moderate to severe disease below the knee popliteal artery and tibial disease. She had 80% distal right external iliac artery above the existing femoral above-knee popliteal bypass but she does not have an open wound on the right foot She arrives in clinic with an extensive open area on the TMA site which in the mid aspect extends into the plantar foot. She also has a more superficial area on the upper part of her Achilles area of the left heel Electronic Signature(s) Signed: 06/15/2021 4:58:28 PM By: [name] MD Entered By: [name] on 06/15/2021 09:27:32 -------------------------------------------------------------------------------- Physical Exam Details Patient Name: Date of Service: [name], [name]. 06/15/2021 8:35 A M Medical Record Number: [id number] Patient Account Number: [id number] Date of Birth/Sex: Treating RN: 1941-01-02 (80 y.o. [name], [name] Primary Care Provider: [name] Other Clinician: Referring Provider: Treating Provider/[name]: Tressie Stalker in Treatment: 0 Constitutional Sitting or standing Blood Pressure is within target range for patient.. Pulse regular and within target range for patient.Marland Kitchen Respirations regular, non-labored and within target range.. Temperature is normal and within the target range for the patient.Marland Kitchen Appears in no distress. Respiratory work of breathing is normal. Cardiovascular Faint dorsalis pedis and posterior tibial pulses on the left. Integumentary (Hair, Skin) No erythema around the wound is visible. Psychiatric appears at normal baseline. Notes Wound exam; substantially  in the left TMA site with a deeper area extending into the left plantar foot connected with the TMA amputation. The deeper area on the plantar foot is completely necrotic I remove necrotic tissue with pickups and a #15 scalpel. Fortunately there appears to be healthy tissue underneath. On the left TMA wound a light surface debridement to remove necrotic tissue this does not  look that unhealthy. Marked bleeding as the patient is on Xarelto controlled with direct pressure Wound on the left foot is full-thickness. There is no exposed bone tendon however on the bottom of the foot clearly muscular tissue Electronic Signature(s) Signed: 06/15/2021 4:58:28 PM By: [name] MD Entered By: [name] on 06/15/2021 09:38:51 -------------------------------------------------------------------------------- Physician Orders Details Patient Name: Date of Service: [name], [name]. 06/15/2021 8:35 A M Medical Record Number: [id number] Patient Account Number: [id number] Date of Birth/Sex: Treating RN: 1940/11/01 (80 y.o. [name] Primary Care Provider: [name] Other Clinician: Referring Provider: Treating Provider/[name]: Tressie Stalker in Treatment: 0 Verbal / Phone Orders: No Diagnosis Coding Follow-up Appointments ppointment in 1 week. - with Dr. Dellia Nims Return A Bathing/ Shower/ Hygiene May shower and wash wound with soap and water. Off-Loading Open toe surgical shoe to: - left foot Home Health New wound care orders this week; continue Home Health for wound care. May utilize formulary equivalent dressing for wound treatment orders unless otherwise specified. - Change primary dressing to Santyl on left transmet and heel wounds Other Home Health Orders/Instructions: - [facility] 3x a week Wound Treatment Wound #13 - Amputation Site - Transmetatarsal Wound Laterality: Left Cleanser: Soap and Water 1 x Per Day/7 Days Discharge Instructions:  May shower and wash wound with dial antibacterial soap and water prior to dressing change. Cleanser: Wound Cleanser (Home Health) 1 x Per Day/7 Days Discharge Instructions: Cleanse the wound with wound cleanser or normal saline prior to applying a clean dressing using gauze sponges, not tissue or cotton balls. Prim Dressing: Santyl Ointment 1 x Per Day/7 Days ary Discharge Instructions: Apply nickel thick amount to wound bed as instructed Prim Dressing: Saline moistened gauze (Home Health) 1 x Per Day/7 Days ary Discharge Instructions: Apply saline moistened gauze over Santyl Secondary Dressing: Woven Gauze Sponge, Non-Sterile 4x4 in (Home Health) 1 x Per Day/7 Days Discharge Instructions: Apply over primary dressing as directed. Secondary Dressing: ABD Pad, 5x9 (Home Health) 1 x Per Day/7 Days Discharge Instructions: Apply over primary dressing as directed. Secured With: [employer], 4.5x3.1 (in/yd) (Home Health) 1 x Per Day/7 Days Discharge Instructions: Secure with Kerlix as directed. Secured With: Paper Tape, 2x10 (in/yd) (Home Health) 1 x Per Day/7 Days Discharge Instructions: Secure dressing with tape as directed. Wound #14 - Calcaneus Wound Laterality: Left, Posterior Cleanser: Soap and Water 1 x Per Day/7 Days Discharge Instructions: May shower and wash wound with dial antibacterial soap and water prior to dressing change. Cleanser: Wound Cleanser (Home Health) 1 x Per Day/7 Days Discharge Instructions: Cleanse the wound with wound cleanser or normal saline prior to applying a clean dressing using gauze sponges, not tissue or cotton balls. Prim Dressing: Santyl Ointment 1 x Per Day/7 Days ary Discharge Instructions: Apply nickel thick amount to wound bed as instructed Secondary Dressing: Woven Gauze Sponge, Non-Sterile 4x4 in (Home Health) 1 x Per Day/7 Days Discharge Instructions: Apply over primary dressing as directed. Secondary Dressing: ALLEVYN Heel 4 1/2in x 5 1/2in  / 10.5cm x 13.5cm (Home Health) 1 x Per Day/7 Days Discharge Instructions: Apply over primary dressing as directed. Secured With: [employer], 4.5x3.1 (in/yd) (Home Health) 1 x Per Day/7 Days Discharge Instructions: Secure with Kerlix as directed. Secured With: Paper Tape, 2x10 (in/yd) (Home Health) 1 x Per Day/7 Days Discharge Instructions: Secure dressing with tape as directed. Electronic Signature(s) Signed: 06/15/2021 4:58:28 PM By: [name] MD Signed: 06/15/2021 5:17:03 PM By: [name]  RN, BSN Entered By: [name] on 06/15/2021 09:12:13 -------------------------------------------------------------------------------- Problem List Details Patient Name: Date of Service: [name] 06/15/2021 8:35 A M Medical Record Number: [id number] Patient Account Number: [id number] Date of Birth/Sex: Treating RN: [birth year] (80 y.o. [name], [name] Primary Care Provider: [name] Other Clinician: Referring Provider: Treating Provider/[name]: Tressie Stalker in Treatment: 0 Active Problems ICD-10 Encounter Code Description Active Date MDM Diagnosis E11.621 Type 2 diabetes mellitus with foot ulcer 06/15/2021 No Yes T81.31XD Disruption of external operation (surgical) wound, not elsewhere classified, 06/15/2021 No Yes subsequent encounter E11.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 06/15/2021 No Yes L97.528 Non-pressure chronic ulcer of other part of left foot with other specified 06/15/2021 No Yes severity L97.428 Non-pressure chronic ulcer of left heel and midfoot with other specified 06/15/2021 No Yes severity Inactive Problems Resolved Problems Electronic Signature(s) Signed: 06/15/2021 4:58:28 PM By: [name] MD Entered By: [name] on 06/15/2021 09:17:43 -------------------------------------------------------------------------------- Progress Note  Details Patient Name: Date of Service: [name], [name]. 06/15/2021 8:35 A M Medical Record Number: [id number] Patient Account Number: [id number] Date of Birth/Sex: Treating RN: June 23, 1941 (80 y.o. [name], [name] Primary Care Provider: [name] Other Clinician: Referring Provider: Treating Provider/[name]: Tressie Stalker in Treatment: 0 Subjective History of Present Illness (HPI) ADMISSION 07/13/2019 Patient is a 80 year old type II diabetic. She has known PAD. She has been followed by Dr. Jacqualyn Posey of podiatry for blistering areas on her toes dating back to 04/30/2019 which at this was the left fourth toe. By 8/11 she had wounds on the right and left second toes. She underwent arterial studies by Dr. Alvester Chou on 7/31 that showed ABIs on the right of 0.60 TBI of 0.26 on the left ABI of 0.56 and a TBI of 0.25. By 9/10 she had ischemic-looking wounds per Dr. Earleen Newport on the right first, left first second and third. She has been using Medihoney and then mupirocin more recently simply Betadine. The patient underwent angiography by Dr. Gwenlyn Found on 9/21. 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 Noted that there was 1 vessel runoff in the foot via the peroneal. ooOn the left there was an 80% left SFA, 70% mid left SFA. There was a short segment calcified CTO distal less than SFA and above-knee popliteal artery reconstituting with two-vessel runoff. 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 It was felt that she had bilateral SFA disease as well as tibial vessel disease. An attempt was made to revascularize the left SFA but they were unable to cross because of the highly calcified nature of the lesion. 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 She has an area on the medial left fourth toe with raised horned callus on top of this. I am not certain what this represents. With regards to pain she has about a 2-1/2 I will claudication tolerance in the grocery store. She has some pain in night which is relieved by putting her feet down over the side of the bed. She is wearing Nitro-Dur patches on the top of her right foot. Past medical history includes type 2 diabetes with secondary PAD, neoplasm of the skin, diabetic retinopathy, carotid artery stenosis, hyperlipidemia hypertension. 11/2; the  last time the patient was here I spoke to Dr. Gwenlyn Found about revascularization options on the right. As I understand things currently Dr. Gwenlyn Found spoke with Dr. Trula Slade and ultimately the patient was taken to surgery on 10/27. She had a right iliofemoral endarterectomy with a bovine patch angioplasty. I think the plan now is for her to have a staged intervention on the right SFA by Dr. Alvester Chou. Per the patient's understanding Dr. Gwenlyn Found and Dr. Jacqualyn Posey are waiting to see when the gangrenous toes on the right foot can be amputated. The patient states her pain is a lot better and she is grateful for that. 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 Small area on the left fourth toe. She is using Betadine to the mummified areas on the right and Medihoney on the left 08/27/2019; the last time I saw this patient I discharged her from the clinic. She had been revascularized by Dr. Trula Slade and she had a right iliofemoral endarterectomy with a bovine angioplasty. 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 Jacqualyn Posey of podiatry on 08/07/2019. I note that she also had a intervention by Dr. Gwenlyn Found and he performed a directional atherectomy and drug-coated balloon angioplasty of the SFA and popliteal artery on the right. I am not certain of the date of Dr. Kennon Holter procedure as  of the time of this dictation. She was referred back to [place] by Dr. Earleen Newport predominantly for follow-up of the left fourth toe. She still has sutures and stitches in the right TMA site. She states her pain is a lot better. She expresses concern about the condition of the amputation site at the TMA. She is on doxycycline I think prescribed by Dr. Earleen Newport. She is complaining of some pain at night 12/10; I spoke to Dr. Jacqualyn Posey last week. He removed the sutures on the right foot on Monday of this week. She has the area on the left fourth toe just proximal to the PIP and then the right TMA site. She is still on doxycycline and has enough through next week. Unfortunately the TMA site does not look good at all. Both on the lateral and medial part of the incisions are areas that probe to bone. There is purulence over the medial part which I have cultured. We will use silver alginate. Left fourth toe looks somewhat better but there was still exposed bone 12/17; patient has an MRI booked for 12/30. Culture I did last week showed Pseudomonas Serratia and Enterococcus. This was purulent drainage coming out of the medial part of her amputation site. I use cefdinir 300 twice daily for 10 days that started on 12/15. Her x-ray on the right showed limited evaluation for osteomyelitis. The findings could have been postoperative. There was subtle erosion in the distal first and distal fifth metatarsal. An MRI was suggested. On the left she had irregularity of the fourth middle and proximal phalanx consistent with a history of osteomyelitis. I do not know that she has a history of osteomyelitis in this area. She had a newly defined area on the plantar third toe 12/31; patient's MRI is listed below: MRI OF THE RIGHT FOREFOOT WITHOUT AND WITH CONTRAST TECHNIQUE: Multiplanar, multisequence MR imaging of the right forefoot was performed before and after the administration of intravenous contrast. CONTRAST 6 mL GADAVIST IV  SOLN : COMPARISON: Plain films right foot 09/04/2019. FINDINGS: Bones/Joint/Cartilage The patient is status post transmetatarsal amputation as seen on  the prior exam. Marrow edema and enhancement are seen in all of the distal metatarsals. In the first metatarsal, signal change extends 1.5 cm proximal to the stump and in the second metatarsal extends approximately 2 cm proximal to the stump. Edema and enhancement are seen in only the distal 0.7 cm of the third metatarsal stump and tips of the fourth and fifth metatarsals. Bone marrow signal is otherwise unremarkable. A small focus of subchondral edema is seen in the lateral talus, likely degenerative. Ligaments Intact. Muscles and Tendons No intramuscular fluid collection. Soft tissues Skin ulceration is seen off the stump of the first metatarsal. A thin fluid collection tracks deep to the wound and over the anterior metatarsals worrisome for small abscess. Intense subcutaneous edema and enhancement are seen diffusely. 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 Cellulitis about the foot. Skin ulceration over the distal first metatarsal is identified with a thin fluid collection tracking anteriorly along the stump worrisome for abscess. Small focus of subchondral edema in the lateral talus is likely degenerative. Electronically Signed By: Inge Rise M.D. On: 09/23/2019 15:25 Patient arrives in clinic today not complaining any of any pain. 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 She does not describe claudication however her activity is very limited. 10/01/2019; since the patient was last here I have communicated with Dr. Gwenlyn Found after bypass by Dr. Trula Slade and addressing the right superficial femoral  artery he states that she is widely patent through peroneal arteries to the ankle with collaterals to the dorsalis pedis. He states he is going to talk to colleagues about the feasibility of tibial pedal access. The patient seems infectious disease later this afternoon Dr. Baxter Flattery. in preparation for this she has been off antibiotics for 1 week and I went ahead and obtain pieces of the remanence of her first metatarsal for pathology and CandS. The patient is a candidate for hyperbaric oxygen. 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 1/15; considerable improvement in the wounds on her feet. We are using silver collagen. She follows with Dr. Baxter Flattery of infectious disease and is on meropenem and daptomycin. She has been taught how to do this herself at home. Follows with Dr. Graylon Good at the end of treatment here. She has 2 wounds on the surgical TMA site 1 lateral and 1 medial the lateral 1 has regressed tremendously. The area medially still has some exposed bone although the base of this looks healthy. 1/22; 2 separate wounds on the surgical TMA site. Both of these look satisfactory. The medial area does not have any exposed bone. This is an improvement On the left side fourth toe dorsally over the proximal phalanx there is a deep punched out area that probes to bone. She has an area on the tip of the left third toe. She also tells [place] that in HBO she traumatized her left Achilles and this is left her with a superficial wound area 1/28; weekly visit along with HBO. She has 5 wounds. T our punched out areas on the original TMA site on the right. Both of these appear to have contracted. o The area on the right no longer has exposed bone. She has an area on the tip of the left third toe and on the DIP of the left fourth toe. Both of  these had surface callus that I removed and unfortunately they have small areas that both go to bone. She has a traumatic wound on the  left Achilles area that happened in HBO and that appears better. She is tolerating her IV antibiotics well at home. She has home health changing the dressings and she is doing it once on the weekends. We have been using silver collagen. She has been extensively revascularized on the right by Dr. Trula Slade and subsequently by Dr. Gwenlyn Found. 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 Baxter Flattery of infectious disease on 2/17. Dr. Gwenlyn Found on 2/23 and according to her her antibiotics stopped on 2/23. She is tolerating hyperbarics well. She has made a tremendous improvement in the right forefoot with only 2 smaller open wounds. Using silver collagen. On the left foot she has the area on the tip of the left third toe and the medial part of the left fourth toe. These had exposed bone last week I did not sense any of that today 2/12; sees Dr. Graylon Good on 2/17 and Dr. Gwenlyn Found on 2/23. We are going to use Dermagraft on this today however the lateral part of her train TMA incision on the right is healed and the medial part is down so much that we just continue with the silver collagen She has wounds on the tip of her left third and the medial aspect of the left fourth. Both of these still have exposed bone I have not been able to get these to epithelialize. 2/19; she sees Dr. Gwenlyn Found on 2/23 and I have communicated with him about the left vascular supply. Looking at her angiogram from 08/03/2019 it looks as though that they could not cross the left CFA. Noted that she had one-vessel runoff bilaterally. She also apparently saw Dr. Graylon Good although I did not look up these results for preparation of this record. 2/26; Dr. Gwenlyn Found is going to do an angiogram next week on Wednesday. I think they are going to try to go at this both retrograde and anterograde to see if anything can be done to revascularize the  left lower limb. She continues to make nice progress on the remaining wound on the right medial foot on her TMA site and the toe it wounds on the left are responding nicely as well 3/12; the patient underwent an extensive and complicated revascularization on the left leg by Dr. Fletcher Anon on 3/3; she had an atherectomy on occluded left popliteal artery; anterior tibial artery followed by a balloon angioplasty. Atherectomy was also performed to the left SFA because there was still significant 50% stenosis in the left popliteal artery they performed an intravascular lithotripsy which improved the residual stenosis to 20%. The same lithotripsy was used to dilate the proximal left SFA. She had a drug-eluting stent placed I believe in the SFA. The patient returned for hyperbarics this week. She has had some eye problems on the right which she tells me are secondary to diabetic retinopathy and she saw her eye doctor. She is really made excellent progress there is no open wounds on the left foot at all. She has one open area medially and her TMA site on the right lateral wound is closed. 3/19; the patient comes in today with the area on the medial transmet on the right improved. Some debris removed from the surface revealed the  still open wound. ooUnfortunately she now has the open area on the third toe tip and the medial aspect of the dorsal fourth toe. These are in the same location as her previous wounds. These had actually closed up last week. 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 She is undergone complex revascularizations on the right by Dr. Trula Slade and Dr. Gwenlyn Found and I believe Dr. Fletcher Anon worked on the tibial vessels on the left most recently.she had a complex transmetatarsal amputation wound with underlying osteomyelitis on presentation to the clinic and then developed deep wounds on the plantar left third toe tip and on the medial part of the left fourth toe  at the PIP. She completed IVantibiotics as directed by infectious disease and I believe a course of oral antibiotics as well.she underwent a complete 40 treatments of hyperbarics. She comes in today with the vast majority of the right TMA site healed there is a small opening on the most medial aspect. Her left third and left fourth toes appear to have epithelialization although this has fluctuated somewhat. 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 She had a gaping wound on the right transmetatarsal amputation L as well as deep wounds on the left third and left fourth toes. She underwent complex revascularizations on her bilateral lower legs which I described on her last visit. She completed IV antibiotics by infectious disease and 40 treatments of hyperbarics. Her left third and fourth toes are healed. The area on the right transmetatarsal amputation site is also closed today. READMISSION 08/04/2020 Mrs. [name] is now a 80 year old woman that we had for a complex stay in our clinic from October 2000 14 January 2020. 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 Her current problems with wounds began on October 30 when she developed a spontaneous opening over the right transmet medial aspect over the first metatarsal head. She has been using collagen on this that she had leftover from her last stay in this clinic. She also has had an open area on the medial right calf from a vein harvest site from her CABG she. She said that this is never really closed since the vein harvest was done. With regards to her arterial status this is a major issue. She had an angiogram by Dr. Gwenlyn Found in September 2020. She developed a gangrenous right first and second toes. Ultimately she was referred to Dr. Trula Slade who did a right common femoral endarterectomy with patch  angioplasty. This was on 07/21/2019 and she really had a good result. Dr. Gwenlyn Found did a atherectomy followed by a drug coated balloon angioplasty of her right SFA popliteal and tibial peroneal trunk on 08/03/2019 again with excellent excellent results. Follow-up.Dopplers in November revealed revealed a widely patent SFA and tibioperoneal trunk. UNFORTUNATELY her recent angiogram that was done on 08/01/2020 showed an 80% right common femoral artery stenosis just above the previous endarterectomy site occluded right right SFA at the origin reconstituting the adductor canal by the profunda femoris collaterals. The profunda femoris is also diffusely diseased. There is and again another 80% tibioperoneal trunk stenosis and one-vessel runoff via the peroneal artery. She is seen Dr. Donzetta Matters and she is scheduled for a femoral endarterectomy and right femoral-popliteal bypass grafting with endarterectomy of her tibial peroneal trunk. The scheduled surgery is on 11/23  The patient does not complain of pain at rest. She does have 5-minute walking claudication. The wounds on her right first met head remanent was spontaneous she does not think she hit this. As mentioned in the area on her right medial calf is a vein harvest site Dr. Gwenlyn Found had texted me about getting her in the clinic and we have arranged this as soon as we can. I have told her that we will have to see how successful Dr. Donzetta Matters is before we know what can be done about the wounds in a precise fashion. Fortunately there does not seem to be any obvious infection involving the right foot although that may need to be looked at if things really deteriorate. She was treated with IV antibiotics and hyperbaric oxygen for her previous osteomyelitis 12/2; the patient underwent a right femoral endarterectomy and bypass femoral to popliteal artery. This was done on 08/16/2020. She is done remarkably well. She has a small area on the right anterior tibia and a small area  in the medial part of her original right transmetatarsal amputation. She appears to have had an excellent response to surgery. 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 [name] is a now 80 year old woman who arrives for review of wounds on the left TMA site extending into her plantar foot as well as an area on the left posterior calcaneus. 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 She was extensively revascularized on the right at that time as noted above in our previous notes ended up receiving IV antibiotics and hyperbaric oxygen therapy. Miraculously this TMA site actually closed and she really has had not any trouble with this since. UNFORTUNATELY the same cannot be said of her left side. Her problem apparently started sometime in July when she hit her left third toe on a barstool while playing pool. She developed an acute infection which eventually led to underlying osteomyelitis. She underwent an extensive hospitalization from 04/27/2021 through 05/15/2021. She received IV antibiotics. Also extensive intervention by Dr. Stanford Breed of vein and vascular which included a left common femoral to tibioperoneal trunk bypass as well as a left third toe amputation on 05/03/2021. Unfortunately the area did not heal she ended up requiring a left TMA. By her description this was left open. She has been using a wound VAC on this to earlier this week and then was switched to Santyl. I think because of the left forefoot off loader she is also developed an area on the left posterior calcaneus. She is not complaining of a lot of pain. She has her daughter at home to change the dressings. She has been prescribed Santyl and she has a tube that she is brought into our clinic. I cannot get a lot of  history of claudication although she is really not that mobile at this point walking with a walker. 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 She has had multiple interventions on the right side by vein and vascular and also by Dr. Gwenlyn Found as well as a transmetatarsal amputation on the right. She has had follow-up noninvasive studies on 06/06/2021; on the left that showed an ABI of 0.47 with monophasic  waveforms a dorsalis pedis ABI of 0.40 with monophasic waveforms. Her ANGIOGRAM which was initially done by Dr. Oneida Alar on 04/28/2021 showed severe left foot superficial femoral artery occlusive disease and an occlusion of the left popliteal artery with two-vessel runoff to the left foot with moderate to severe disease below the knee popliteal artery and tibial disease. She had 80% distal right external iliac artery above the existing femoral above-knee popliteal bypass but she does not have an open wound on the right foot She arrives in clinic with an extensive open area on the TMA site which in the mid aspect extends into the plantar foot. She also has a more superficial area on the upper part of her Achilles area of the left heel Patient History Information obtained from Patient. Allergies Demerol, scopolamine, Bactrim (Reaction: increased K+), Versed (Reaction: agitation), tramadol (Reaction: kept awake) Family History Cancer - Mother, Diabetes - Maternal Grandparents, Hypertension - Mother, Stroke - Mother,Maternal Grandparents, No family history of Heart Disease, Hereditary Spherocytosis, Kidney Disease, Lung Disease, Seizures, Thyroid Problems, Tuberculosis. Social History Former smoker, Marital Status - Widowed, Alcohol Use - Never, Drug Use - No History, Caffeine Use - Daily. Medical History Eyes Denies history of Cataracts, Glaucoma, Optic  Neuritis Ear/Nose/Mouth/Throat Denies history of Chronic sinus problems/congestion, Middle ear problems Hematologic/Lymphatic Denies history of Anemia, Hemophilia, Human Immunodeficiency Virus, Lymphedema, Sickle Cell Disease Respiratory Denies history of Aspiration, Asthma, Chronic Obstructive Pulmonary Disease (COPD), Pneumothorax, Sleep Apnea, Tuberculosis Cardiovascular Patient has history of Coronary Artery Disease, Deep Vein Thrombosis, Hypertension, 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, Peripheral Arterial Disease Denies history of Angina, Arrhythmia, Congestive Heart Failure, Hypotension, Peripheral Venous Disease, Phlebitis, Vasculitis Gastrointestinal Denies history of Cirrhosis , Colitis, Crohnoos, Hepatitis A, Hepatitis B, Hepatitis C Endocrine Patient has history of Type II Diabetes Denies history of Type I Diabetes Genitourinary Denies history of End Stage Renal Disease Immunological Denies history of Lupus Erythematosus, Raynaudoos, Scleroderma Integumentary (Skin) Denies history of History of Burn Musculoskeletal Denies history of Gout, Rheumatoid Arthritis, Osteoarthritis, Osteomyelitis Neurologic Denies history of Dementia, Neuropathy, Quadriplegia, Paraplegia, Seizure Disorder Oncologic Denies history of Received Chemotherapy, Received Radiation Psychiatric Denies history of Anorexia/bulimia, Confinement Anxiety Medical A Surgical History Notes nd Cardiovascular CABG x 5 Review of Systems (ROS) Constitutional Symptoms (General Health) Denies complaints or symptoms of Fatigue, Fever, Chills, Marked Weight Change. Eyes Complains or has symptoms of Glasses / Contacts - glasses. Ear/Nose/Mouth/Throat Denies complaints or symptoms of Chronic sinus problems or rhinitis. Respiratory Denies complaints or symptoms of Chronic or frequent coughs, Shortness of Breath. Gastrointestinal Denies complaints or symptoms of Frequent diarrhea, Nausea,  Vomiting. Genitourinary Denies complaints or symptoms of Frequent urination. Integumentary (Skin) Complains or has symptoms of Wounds - wounds on left foot and heel. Musculoskeletal Denies complaints or symptoms of Muscle Pain, Muscle Weakness. Neurologic Denies complaints or symptoms of Numbness/parasthesias. Psychiatric Denies complaints or symptoms of Claustrophobia, Suicidal. Objective Constitutional Sitting or standing Blood Pressure is within target range for patient.. Pulse regular and within target range for patient.Marland Kitchen Respirations regular, non-labored and within target range.. Temperature is normal and within the target range for the patient.Marland Kitchen Appears in no distress. Vitals Time Taken: 8:30 AM, Height: 64 in, Source: Stated, Weight: 135 lbs, Source: Stated, BMI: 23.2, Temperature: 97.7 F, Pulse: 83 bpm, Respiratory Rate: 18 breaths/min, Blood Pressure: 122/67 mmHg, Capillary Blood Glucose: 105 mg/dl. Respiratory work of breathing is normal. Cardiovascular Faint dorsalis pedis and posterior tibial pulses on the left. Psychiatric appears at normal baseline. General Notes: Wound exam; substantially in the  left TMA site with a deeper area extending into the left plantar foot connected with the TMA amputation. The deeper area on the plantar foot is completely necrotic I remove necrotic tissue with pickups and a #15 scalpel. Fortunately there appears to be healthy tissue underneath. On the left TMA wound a light surface debridement to remove necrotic tissue this does not look that unhealthy. Marked bleeding as the patient is on Xarelto controlled with direct pressure Wound on the left foot is full-thickness. There is no exposed bone tendon however on the bottom of the foot clearly muscular tissue Integumentary (Hair, Skin) No erythema around the wound is visible. Wound #13 status is Open. Original cause of wound was Surgical Injury. The date acquired was: 05/11/2021. The wound is  located on the Left Amputation Site - Transmetatarsal. The wound measures 6.5cm length x 7.5cm width x 0.9cm depth; 38.288cm^2 area and 34.459cm^3 volume. There is Fat Layer (Subcutaneous Tissue) exposed. There is no tunneling or undermining noted. There is a medium amount of purulent drainage noted. The wound margin is flat and intact. There is medium (34-66%) red, pink granulation within the wound bed. There is a medium (34-66%) amount of necrotic tissue within the wound bed including Adherent Slough. Wound #14 status is Open. Original cause of wound was Pressure Injury. The date acquired was: 05/11/2021. The wound is located on the Left,Posterior Calcaneus. The wound measures 1.1cm length x 1.6cm width x 0.1cm depth; 1.382cm^2 area and 0.138cm^3 volume. There is no tunneling or undermining noted. There is a medium amount of purulent drainage noted. The wound margin is flat and intact. There is no granulation within the wound bed. There is a large (67-100%) amount of necrotic tissue within the wound bed including Adherent Slough. 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 There was a Excisional Skin/Subcutaneous Tissue Debridement with a total area of 48.75 sq cm performed by Ricard Dillon., MD. With the following instrument(s): Curette to remove Viable and Non-Viable tissue/material. Material removed  includes Eschar, Subcutaneous Tissue, and Slough. No specimens were taken. A time out was conducted at 09:03, prior to the start of the procedure. A Moderate amount of bleeding was controlled with Pressure. The procedure was tolerated well with a pain level of 0 throughout and a pain level of 0 following the procedure. Post Debridement Measurements: 6.5cm length x 7.5cm width x 0.9cm depth; 34.459cm^3 volume. Character of Wound/Ulcer Post Debridement requires further debridement. Severity of Tissue Post Debridement is: Fat layer exposed. Post procedure Diagnosis Wound #13: Same as Pre-Procedure Wound #14 Pre-procedure diagnosis of Wound #14 is a Diabetic Wound/Ulcer of the Lower Extremity located on the Left,Posterior Calcaneus .Severity of Tissue Pre Debridement is: Fat layer exposed. There was a Chemical/Enzymatic/Mechanical debridement performed by [name], RN.Marland Kitchen Agent used was [employer]. A time out was conducted at 09:15, prior to the start of the procedure. There was no bleeding. The procedure was tolerated well with a pain level of 0 throughout and a pain level of 0 following the procedure. Post  Debridement Measurements: 1.1cm length x 1.6cm width x 0.1cm depth; 0.138cm^3 volume. Character of Wound/Ulcer Post Debridement requires further debridement. Severity of Tissue Post Debridement is: Fat layer exposed. Post procedure Diagnosis Wound #14: Same as Pre-Procedure Plan Follow-up Appointments: Return Appointment in 1 week. - with Dr. Dellia Nims Bathing/ Shower/ Hygiene: May shower and wash wound with soap and water. Off-Loading: Open toe surgical shoe to: - left foot Home Health: New wound care orders this week; continue Home Health for wound care. May utilize formulary equivalent dressing for wound treatment orders unless otherwise specified. - Change primary dressing to Santyl on left transmet and heel wounds Other Home Health Orders/Instructions: - [facility] 3x a week WOUND #13: -  Amputation Site - Transmetatarsal Wound Laterality: Left Cleanser: Soap and Water 1 x Per Day/7 Days Discharge Instructions: May shower and wash wound with dial antibacterial soap and water prior to dressing change. Cleanser: Wound Cleanser (Home Health) 1 x Per Day/7 Days Discharge Instructions: Cleanse the wound with wound cleanser or normal saline prior to applying a clean dressing using gauze sponges, not tissue or cotton balls. Prim Dressing: Santyl Ointment 1 x Per Day/7 Days ary Discharge Instructions: Apply nickel thick amount to wound bed as instructed Prim Dressing: Saline moistened gauze (Home Health) 1 x Per Day/7 Days ary Discharge Instructions: Apply saline moistened gauze over Santyl Secondary Dressing: Woven Gauze Sponge, Non-Sterile 4x4 in (Home Health) 1 x Per Day/7 Days Discharge Instructions: Apply over primary dressing as directed. Secondary Dressing: ABD Pad, 5x9 (Home Health) 1 x Per Day/7 Days Discharge Instructions: Apply over primary dressing as directed. Secured With: [employer], 4.5x3.1 (in/yd) (Home Health) 1 x Per Day/7 Days Discharge Instructions: Secure with Kerlix as directed. Secured With: Paper T ape, 2x10 (in/yd) (Home Health) 1 x Per Day/7 Days Discharge Instructions: Secure dressing with tape as directed. WOUND #14: - Calcaneus Wound Laterality: Left, Posterior Cleanser: Soap and Water 1 x Per Day/7 Days Discharge Instructions: May shower and wash wound with dial antibacterial soap and water prior to dressing change. Cleanser: Wound Cleanser (Home Health) 1 x Per Day/7 Days Discharge Instructions: Cleanse the wound with wound cleanser or normal saline prior to applying a clean dressing using gauze sponges, not tissue or cotton balls. Prim Dressing: Santyl Ointment 1 x Per Day/7 Days ary Discharge Instructions: Apply nickel thick amount to wound bed as instructed Secondary Dressing: Woven Gauze Sponge, Non-Sterile 4x4 in (Home Health) 1 x Per  Day/7 Days Discharge Instructions: Apply over primary dressing as directed. Secondary Dressing: ALLEVYN Heel 4 1/2in x 5 1/2in / 10.5cm x 13.5cm (Home Health) 1 x Per Day/7 Days Discharge Instructions: Apply over primary dressing as directed. Secured With: [employer], 4.5x3.1 (in/yd) (Home Health) 1 x Per Day/7 Days Discharge Instructions: Secure with Kerlix as directed. Secured With: Paper T ape, 2x10 (in/yd) (Home Health) 1 x Per Day/7 Days Discharge Instructions: Secure dressing with tape as directed. 1. I agree with the Santyl that I think it been previously prescribed by Dr. Luan Pulling this will be changed daily. She already knows how to wrap this with gauze. She is padding the heel with a foam border. She has a left foot heel off loader she is going to be have to be careful about putting too much pressure on the heel. I encouraged her not to do an excessive amount of ambulation 2. I will look at the surface of the TMA and the associated plantar area next week with [facility] 3. The patient has  been revascularized. According to the patient her last review by Dr. Luan Pulling suggested that the left common femoral to tibioperoneal trunk bypass was patent. 4. I see no evidence of infection here. I did not image this area no cultures were obtained and no antibiotics were prescribed 5. Ultimately the patient is going to require an advanced treatment product. I would like to delay this until we are certain to have the wound bed prep properly. I am thinking epi fix I spent 45 minutes in review of this patient's past medical history, face-to-face evaluation and preparation of this record Electronic Signature(s) Signed: 06/15/2021 4:58:28 PM By: [name] MD Entered By: [name] on 06/15/2021 09:39:51 -------------------------------------------------------------------------------- HxROS Details Patient Name: Date of Service: [name], Martorell. 06/15/2021 8:35 A M Medical Record  Number: [id number] Patient Account Number: [id number] Date of Birth/Sex: Treating RN: 1941-01-19 (80 y.o. [name] Primary Care Provider: [name] Other Clinician: Referring Provider: Treating Provider/[name]: Tressie Stalker in Treatment: 0 Information Obtained From Patient Constitutional Symptoms (General Health) Complaints and Symptoms: Negative for: Fatigue; Fever; Chills; Marked Weight Change Eyes Complaints and Symptoms: Positive for: Glasses / Contacts - glasses Medical History: Negative for: Cataracts; Glaucoma; Optic Neuritis Ear/Nose/Mouth/Throat Complaints and Symptoms: Negative for: Chronic sinus problems or rhinitis Medical History: Negative for: Chronic sinus problems/congestion; Middle ear problems Respiratory Complaints and Symptoms: Negative for: Chronic or frequent coughs; Shortness of Breath Medical History: Negative for: Aspiration; Asthma; Chronic Obstructive Pulmonary Disease (COPD); Pneumothorax; Sleep Apnea; Tuberculosis Gastrointestinal Complaints and Symptoms: Negative for: Frequent diarrhea; Nausea; Vomiting Medical History: Negative for: Cirrhosis ; Colitis; Crohns; Hepatitis A; Hepatitis B; Hepatitis C Genitourinary Complaints and Symptoms: Negative for: Frequent urination Medical History: Negative for: End Stage Renal Disease Integumentary (Skin) Complaints and Symptoms: Positive for: Wounds - wounds on left foot and heel Medical History: Negative for: History of Burn Musculoskeletal Complaints and Symptoms: Negative for: Muscle Pain; Muscle Weakness Medical History: Negative for: Gout; Rheumatoid Arthritis; Osteoarthritis; Osteomyelitis Neurologic Complaints and Symptoms: Negative for: Numbness/parasthesias Medical History: Negative for: Dementia; Neuropathy; Quadriplegia; Paraplegia; Seizure Disorder Psychiatric Complaints and Symptoms: Negative for: Claustrophobia; Suicidal Medical  History: Negative for: Anorexia/bulimia; Confinement Anxiety Hematologic/Lymphatic Medical History: Negative for: Anemia; Hemophilia; Human Immunodeficiency Virus; Lymphedema; Sickle Cell Disease Cardiovascular Medical History: Positive for: Coronary Artery Disease; Deep Vein Thrombosis; Hypertension; 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; Peripheral Arterial Disease Negative for: Angina; Arrhythmia; Congestive Heart Failure; Hypotension; Peripheral Venous Disease; Phlebitis; Vasculitis Past Medical History Notes: CABG x 5 Endocrine Medical History: Positive for: Type II Diabetes Negative for: Type I Diabetes Time with diabetes: 73 Treated with: Oral agents Blood sugar tested every day: Yes Tested : daily Immunological Medical History: Negative for: Lupus Erythematosus; Raynauds; Scleroderma Oncologic Medical History: Negative for: Received Chemotherapy; Received Radiation Immunizations Pneumococcal Vaccine: Received Pneumococcal Vaccination: No Implantable Devices None Family and Social History Cancer: Yes - Mother; Diabetes: Yes - Maternal Grandparents; Heart Disease: No; Hereditary Spherocytosis: No; Hypertension: Yes - Mother; Kidney Disease: No; Lung Disease: No; Seizures: No; Stroke: Yes - Mother,Maternal Grandparents; Thyroid Problems: No; Tuberculosis: No; Former smoker; Marital Status - Widowed; Alcohol Use: Never; Drug Use: No History; Caffeine Use: Daily; Financial Concerns: No; Food, Clothing or Shelter Needs: No; Support System Lacking: No; Transportation Concerns: No Engineer, maintenance) Signed: 06/15/2021 4:58:28 PM By: [name] MD Signed: 06/15/2021 5:17:03 PM By: [name] RN, BSN Entered By: [name] on 06/15/2021 08:42:09 -------------------------------------------------------------------------------- [place] Details Patient Name: Date of Service: [name], [place]. 06/15/2021 Medical  Record Number: [id number] Patient Account Number:  [id number] Date of Birth/Sex: Treating RN: [birth year] (80 y.o. 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 Facility Procedures CPT4 Code: [id number] Description: 82423 - WOUND CARE VISIT-LEV 3 EST PT Modifier: 25 Quantity: 1 CPT4 Code: 53614431 Description: 54008 - DEBRIDE W/O ANES NON SELECT Modifier: Quantity: 1 CPT4 Code: [id number] Description: 11042 - DEB SUBQ TISSUE 20 SQ CM/< ICD-10 Diagnosis Description L97.528 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 Modifier: ity Quantity: 1 CPT4 Code: 32671245 Description: 80998 - DEB SUBQ TISS EA ADDL 20CM ICD-10 Diagnosis Description L97.528 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 Modifier: ity Quantity: 2 Physician Procedures : CPT4 Code Description Modifier [id number] 39767 - WC PHYS LEVEL 5 - EST PT 25 ICD-10 Diagnosis Description L97.528 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ity E11.621 Type 2 diabetes mellitus with foot ulcer  T81.31XD Disruption of external operation (surgical) wound, not elsewhere classified, subsequent encounter E11.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 Quantity: 1 : [id number] 11042 - WC PHYS SUBQ TISS 20 SQ CM ICD-10 Diagnosis Description L97.528  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ity Quantity: 1 : [id number] 11045 - WC PHYS SUBQ TISS EA ADDL 20 CM ICD-10 Diagnosis Description L97.528 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ity Quantity: 2 Electronic Signature(s) Signed: 06/15/2021 4:58:28 PM By: [name] MD Signed: 06/15/2021 5:17:03 PM By: [name] RN, BSN Entered By: [name] on 06/15/2021 11:45:45

## 2021-06-21 NOTE — Progress Notes (Signed)
[name], [place] ([id number]) Visit Report for 06/15/2021 Allergy List Details Patient Name: Date of Service: [name] 06/15/2021 8:35 A M Medical Record Number: [id number] Patient Account Number: [id number] Date of Birth/Sex: Treating RN: [birth year] (80 y.o. [name] Primary Care Corry Storie: [name] Other Clinician: Referring Trae Bovenzi: Treating Coltin Casher/[name]: Tressie Stalker in Treatment: 0 Allergies Active Allergies Demerol scopolamine Bactrim Reaction: increased K+ Versed Reaction: agitation tramadol Reaction: kept awake Allergy Notes Electronic Signature(s) Signed: 06/15/2021 5:17:03 PM By: [name] RN, BSN Entered By: [name] on 06/15/2021 08:46:13 -------------------------------------------------------------------------------- Arrival Information Details Patient Name: Date of Service: [name], [name]. 06/15/2021 8:35 A M Medical Record Number: [id number] Patient Account Number: [id number] Date of Birth/Sex: Treating RN: 1941-06-25 (80 y.o. [name], [name] Primary Care Melisia Leming: [name] Other Clinician: Referring Lyncoln Maskell: Treating Tarrin Lebow/[name]: Tressie Stalker in Treatment: 0 Visit Information Patient Arrived: Charlyn Minerva Time: 08:29 Accompanied By: self Transfer Assistance: None Patient Identification Verified: Yes Secondary Verification Process Completed: Yes Patient Has Alerts: Yes Patient Alerts: Patient on Blood Thinner L ABI: 0.82 R ABI: 0.47 Electronic Signature(s) Signed: 06/15/2021 5:17:03 PM By: [name] RN, BSN Entered By: Donnal Debar, History Since Last Visit Added or deleted any medications: No Any new allergies or adverse reactions: No Had a fall or experienced change in activities of daily living that may affect risk of falls: No Signs or symptoms of abuse/neglect since last visito No Hospitalized since last visit:  No Implantable device outside of the clinic excluding cellular tissue based products placed in the center since last visit: No Has Dressing in Place as Prescribed: Yes Pain Present Now: No -------------------------------------------------------------------------------- Clinic Level of Care Assessment Details Patient Name: Date of Service: [name] 06/15/2021 8:35 A M Medical Record Number: [id number] Patient Account Number: [id number] Date of Birth/Sex: Treating RN: 1941-02-18 (80 y.o. [name] Primary Care Praise Stennett: [name] Other Clinician: Referring Mystic Labo: Treating Lyn Deemer/[name]: Tressie Stalker in Treatment: 0 Clinic Level of Care Assessment Items TOOL 1 Quantity Score X- 1 0 Use when EandM and Procedure is performed on INITIAL visit ASSESSMENTS - Nursing Assessment / Reassessment X- 1 20 General Physical Exam (combine w/ comprehensive assessment (listed just below) when performed on new pt. evals) X- 1 25 Comprehensive Assessment (HX, ROS, Risk Assessments, Wounds Hx, etc.) ASSESSMENTS - Wound and Skin Assessment / Reassessment []  - 0 Dermatologic / Skin Assessment (not related to wound area) ASSESSMENTS - Ostomy and/or Continence Assessment and Care []  - 0 Incontinence Assessment and Management []  - 0 Ostomy Care Assessment and Management (repouching, etc.) PROCESS - Coordination of Care X - Simple Patient / Family Education for ongoing care 1 15 []  - 0 Complex (extensive) Patient / Family Education for ongoing care X- 1 10 Staff obtains Programmer, systems, Records, T Results / Process Orders est X- 1 10 Staff telephones HHA, Nursing Homes / Clarify orders / etc []  - 0 Routine Transfer to another Facility (non-emergent condition) []  - 0 Routine Hospital Admission (non-emergent condition) X- 1 15 New Admissions / Biomedical engineer / Ordering NPWT Apligraf, etc. , []  - 0 Emergency Hospital Admission  (emergent condition) PROCESS - Special Needs []  - 0 Pediatric / Minor Patient Management []  - 0 Isolation Patient Management []  - 0 Hearing / Language / Visual special needs []  - 0 Assessment of Community assistance (transportation, D/C planning, etc.) []  - 0 Additional assistance / Altered mentation []  - 0 Support Surface(s)  Assessment (bed, cushion, seat, etc.) INTERVENTIONS - Miscellaneous []  - 0 External ear exam []  - 0 Patient Transfer (multiple staff / Civil Service fast streamer / Similar devices) []  - 0 Simple Staple / Suture removal (25 or less) []  - 0 Complex Staple / Suture removal (26 or more) []  - 0 Hypo/Hyperglycemic Management (do not check if billed separately) []  - 0 Ankle / Brachial Index (ABI) - do not check if billed separately Has the patient been seen at the hospital within the last three years: Yes Total Score: 95 Level Of Care: New/Established - Level 3 Electronic Signature(s) Signed: 06/15/2021 5:17:03 PM By: [name] RN, BSN Entered By: [name] on 06/15/2021 09:15:17 -------------------------------------------------------------------------------- Encounter Discharge Information Details Patient Name: Date of Service: [name], [name]. 06/15/2021 8:35 A M Medical Record Number: [id number] Patient Account Number: [id number] Date of Birth/Sex: Treating RN: 06-20-1941 (80 y.o. [name] Primary Care Atalie Oros: [name] Other Clinician: Referring Destani Wamser: Treating Dequan Kindred/[name]: Tressie Stalker in Treatment: 0 Encounter Discharge Information Items Post Procedure Vitals Discharge Condition: Stable Temperature (F): 97.7 Ambulatory Status: Walker Pulse (bpm): 83 Discharge Destination: Home Respiratory Rate (breaths/min): 18 Transportation: Private Auto Blood Pressure (mmHg): 122/67 Accompanied By: alone Schedule Follow-up Appointment: Yes Clinical Summary of Care: Patient Declined Electronic  Signature(s) Signed: 06/15/2021 5:17:03 PM By: [name] RN, BSN Entered By: [name] on 06/15/2021 11:44:32 -------------------------------------------------------------------------------- Lower Extremity Assessment Details Patient Name: Date of Service: [name], [name] 06/15/2021 8:35 A M Medical Record Number: [id number] Patient Account Number: [id number] Date of Birth/Sex: Treating RN: 1941-01-25 (80 y.o. [name] Primary Care Chavonne Sforza: [name] Other Clinician: Referring Michalla Ringer: Treating Yaniv Lage/[name]: Tressie Stalker in Treatment: 0 Edema Assessment Assessed: Shirlyn Goltz: No] Patrice Paradise: No] Edema: [Left: N] [Right: o] Calf Left: Right: Point of Measurement: From Medial Instep 31 cm Ankle Left: Right: Point of Measurement: From Medial Instep 20 cm Knee To Floor Left: Right: From Medial Instep 30 cm Vascular Assessment Pulses: Dorsalis Pedis Palpable: [Left:Yes] Electronic Signature(s) Signed: 06/15/2021 5:17:03 PM By: [name] RN, BSN Entered By: [name] on 06/15/2021 09:00:27 -------------------------------------------------------------------------------- Multi Wound Chart Details Patient Name: Date of Service: [name], [name]. 06/15/2021 8:35 A M Medical Record Number: [id number] Patient Account Number: [id number] Date of Birth/Sex: Treating RN: [birth year] (80 y.o. [name], [name] Primary Care Jawara Latorre: [name] Other Clinician: Referring Dade Rodin: Treating Kelley Polinsky/[name]: Tressie Stalker in Treatment: 0 Vital Signs Height(in): 64 Capillary Blood Glucose(mg/dl): 105 Weight(lbs): 135 Pulse(bpm): 37 Body Mass Index(BMI): 23 Blood Pressure(mmHg): 122/67 Temperature(F): 97.7 Respiratory Rate(breaths/min): 18 Photos: [N/A:N/A] Left Amputation Site - Transmetatarsal Left, Posterior Calcaneus N/A Wound Location: Surgical Injury Pressure Injury  N/A Wounding Event: Diabetic Wound/Ulcer of the Lower Diabetic Wound/Ulcer of the Lower N/A Primary Etiology: Extremity Extremity Open Surgical Wound N/A N/A Secondary Etiology: Coronary Artery Disease, Deep Vein Coronary Artery Disease, Deep Vein N/A Comorbid History: Thrombosis, Hypertension, Myocardial Thrombosis, Hypertension, Myocardial Infarction, Peripheral Arterial Disease, Infarction, Peripheral Arterial Disease, Type II Diabetes Type II Diabetes 05/11/2021 05/11/2021 N/A Date Acquired: 0 0 N/A Weeks of Treatment: Open Open N/A Wound Status: 6.5x7.5x0.9 1.1x1.6x0.1 N/A Measurements L x W x D (cm) 38.288 1.382 N/A A (cm) : rea 34.459 0.138 N/A Volume (cm) : 0.00% 0.00% N/A % Reduction in A rea: 0.00% 0.00% N/A % Reduction in Volume: Grade 2 Unable to visualize wound bed N/A Classification: Medium Medium N/A Exudate A mount: Purulent Purulent N/A Exudate Type: yellow, brown, green yellow,  brown, green N/A Exudate Color: Flat and Intact Flat and Intact N/A Wound Margin: Medium (34-66%) None Present (0%) N/A Granulation A mount: Red, Pink N/A N/A Granulation Quality: Medium (34-66%) Large (67-100%) N/A Necrotic A mount: Fat Layer (Subcutaneous Tissue): Yes Fascia: No N/A Exposed Structures: Fascia: No Fat Layer (Subcutaneous Tissue): No Tendon: No Tendon: No Muscle: No Muscle: No Joint: No Joint: No Bone: No Bone: No None None N/A Epithelialization: Debridement - Excisional Chemical/Enzymatic/Mechanical N/A Debridement: 09:03 09:15 N/A Pre-procedure Verification/Time Out Taken: Subcutaneous, Slough N/A N/A Tissue Debrided: Skin/Subcutaneous Tissue N/A N/A Level: 48.75 N/A N/A Debridement A (sq cm): rea Curette N/A N/A Instrument: Moderate None N/A Bleeding: Pressure N/A N/A Hemostasis A chieved: 0 0 N/A Procedural Pain: 0 0 N/A Post Procedural Pain: Procedure was tolerated well Procedure was tolerated well N/A Debridement  Treatment Response: 6.5x7.5x0.9 1.1x1.6x0.1 N/A Post Debridement Measurements L x W x D (cm) 34.459 0.138 N/A Post Debridement Volume: (cm) Debridement Debridement N/A Procedures Performed: Treatment Notes Electronic Signature(s) Signed: 06/15/2021 4:58:28 PM By: [name] MD Signed: 06/21/2021 5:21:18 PM By: Rhae Hammock RN Entered By: [name] on 06/15/2021 09:17:55 -------------------------------------------------------------------------------- Multi-Disciplinary Care Plan Details Patient Name: Date of Service: [name], [name]. 06/15/2021 8:35 A M Medical Record Number: [id number] Patient Account Number: [id number] Date of Birth/Sex: Treating RN: 08-16-1941 (80 y.o. [name] Primary Care Airianna Kreischer: [name] Other Clinician: Referring Modest Draeger: Treating Tawny Raspberry/[name]: Tressie Stalker in Treatment: 0 Multidisciplinary Care Plan reviewed with physician Active Inactive Nutrition Nursing Diagnoses: Impaired glucose control: actual or potential Potential for alteratiion in Nutrition/Potential for imbalanced nutrition Goals: Patient/caregiver agrees to and verbalizes understanding of need to use nutritional supplements and/or vitamins as prescribed Date Initiated: 06/15/2021 Target Resolution Date: 07/14/2021 Goal Status: Active Patient/caregiver will maintain therapeutic glucose control Date Initiated: 06/15/2021 Target Resolution Date: 07/14/2021 Goal Status: Active Interventions: Assess HgA1c results as ordered upon admission and as needed Assess patient nutrition upon admission and as needed per policy Provide education on elevated blood sugars and impact on wound healing Provide education on nutrition Notes: Wound/Skin Impairment Nursing Diagnoses: Impaired tissue integrity Knowledge deficit related to ulceration/compromised skin integrity Goals: Patient/caregiver will verbalize understanding of skin  care regimen Date Initiated: 06/15/2021 Target Resolution Date: 07/14/2021 Goal Status: Active Interventions: Assess patient/caregiver ability to obtain necessary supplies Assess patient/caregiver ability to perform ulcer/skin care regimen upon admission and as needed Assess ulceration(s) every visit Provide education on ulcer and skin care Notes: Electronic Signature(s) Signed: 06/15/2021 5:17:03 PM By: [name] RN, BSN Entered By: [name] on 06/15/2021 09:03:34 -------------------------------------------------------------------------------- Pain Assessment Details Patient Name: Date of Service: [name], [name]. 06/15/2021 8:35 A M Medical Record Number: [id number] Patient Account Number: [id number] Date of Birth/Sex: Treating RN: 11/13/1940 (80 y.o. [name] Primary Care Linard Daft: [name] Other Clinician: Referring Thea Holshouser: Treating Keaten Mashek/[name]: Tressie Stalker in Treatment: 0 Active Problems Location of Pain Severity and Description of Pain Patient Has Paino No Site Locations Pain Management and Medication Current Pain Management: Electronic Signature(s) Signed: 06/15/2021 5:17:03 PM By: [name] RN, BSN Entered By: [name] on 06/15/2021 08:45:29 -------------------------------------------------------------------------------- Patient/Caregiver Education Details Patient Name: Date of Service: [name], [name] 9/22/2022andnbsp8:35 A M Medical Record Number: [id number] Patient Account Number: [id number] Date of Birth/Gender: Treating RN: 02-12-1941 (80 y.o. [name] Primary Care Physician: [name] Other Clinician: Referring Physician: Treating Physician/[name]: Tressie Stalker in Treatment: 0 Education Assessment Education Provided To: Patient  Education Topics Provided Elevated Blood Sugar/ Impact on Healing: Methods:  Explain/Verbal Responses: Return demonstration correctly Nutrition: Methods: Explain/Verbal Responses: State content correctly Wound/Skin Impairment: Methods: Explain/Verbal Responses: State content correctly Electronic Signature(s) Signed: 06/15/2021 5:17:03 PM By: [name] RN, BSN Entered By: [name] on 06/15/2021 09:04:34 -------------------------------------------------------------------------------- Wound Assessment Details Patient Name: Date of Service: [name], [name]. 06/15/2021 8:35 A M Medical Record Number: [id number] Patient Account Number: [id number] Date of Birth/Sex: Treating RN: March 30, 1941 (80 y.o. 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 Secondary Open Surgical Wound Wounding Event: Surgical Injury Etiology: Date Acquired: 05/11/2021 Wound Open Weeks Of Treatment: 0 Status: Clustered Wound: No Comorbid Coronary Artery Disease, Deep Vein Thrombosis, Hypertension, History: Myocardial Infarction, Peripheral Arterial Disease, Type II Diabetes Photos Wound Measurements Length: (cm) 6.5 Width: (cm) 7.5 Depth: (cm) 0.9 Area: (cm) 38.288 Volume: (cm) 34.459 % Reduction in Area: 0% % Reduction in Volume: 0% Epithelialization: None Tunneling: No Undermining: No Wound Description Classification: Grade 2 Wound Margin: Flat and Intact Exudate Amount: Medium Exudate Type: Purulent Exudate Color: yellow, brown, green Foul Odor After Cleansing: No Slough/Fibrino Yes Wound Bed Granulation Amount: Medium (34-66%) Exposed Structure Granulation Quality: Red, Pink Fascia Exposed: No Necrotic Amount: Medium (34-66%) Fat Layer (Subcutaneous Tissue) Exposed:  Yes Necrotic Quality: Adherent Slough Tendon Exposed: No Muscle Exposed: No Joint Exposed: No Bone Exposed: No Treatment Notes Wound #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 Wound Cleanser Discharge Instruction: Cleanse the wound with wound cleanser or normal saline prior to applying a clean dressing using gauze sponges, not tissue or cotton balls. Peri-Wound Care Topical Primary Dressing Santyl Ointment Discharge Instruction: Apply nickel thick amount to wound bed as instructed Saline moistened gauze Discharge Instruction: Apply saline moistened gauze over Santyl Secondary Dressing Woven Gauze Sponge, Non-Sterile 4x4 in Discharge Instruction: Apply over primary dressing as directed. ABD Pad, 5x9 Discharge Instruction: Apply over primary dressing as directed. Secured With [employer], 4.5x3.1 (in/yd) Discharge Instruction: Secure with Kerlix as directed. Paper Tape, 2x10 (in/yd) Discharge Instruction: Secure dressing with tape as directed. Compression Wrap Compression Stockings Add-Ons Electronic Signature(s) Signed: 06/15/2021 5:17:03 PM By: [name] RN, BSN Signed: 06/15/2021 5:22:33 PM By: Deon Pilling RN, BSN Entered By: [name] on 06/15/2021 08:53:20 -------------------------------------------------------------------------------- Wound Assessment Details Patient Name: Date of Service: [name], [name]. 06/15/2021 8:35 A M Medical Record Number: [id number] Patient Account Number: [id number] Date of Birth/Sex: Treating RN: 06-15-1941 (79 y.o. [name], [name] Primary Care Cicely Ortner: [name] Other Clinician: Referring Lache Dagher: Treating Geoffrey Hynes/[name]: Tressie Stalker in Treatment: 0 Wound Status Wound Number: 14 Primary Diabetic Wound/Ulcer of the  Lower Extremity Etiology: Wound Location: Left, Posterior Calcaneus Wound Open Wounding Event: Pressure Injury Status: Date Acquired: 05/11/2021 Comorbid Coronary Artery Disease, Deep Vein Thrombosis, Hypertension, Weeks Of Treatment: 0 History: Myocardial Infarction, Peripheral Arterial Disease, Type II Diabetes Clustered Wound: No Photos Wound Measurements Length: (cm) 1.1 Width: (cm) 1.6 Depth: (cm) 0.1 Area: (cm) 1.382 Volume: (cm) 0.138 % Reduction in Area: 0% % Reduction in Volume: 0% Epithelialization: None Tunneling: No Undermining: No Wound Description Classification: Unable to visualize wound bed Wound Margin: Flat and Intact Exudate Amount: Medium  Exudate Type: Purulent Exudate Color: yellow, brown, green Foul Odor After Cleansing: No Slough/Fibrino Yes Wound Bed Granulation Amount: None Present (0%) Exposed Structure Necrotic Amount: Large (67-100%) Fascia Exposed: No Necrotic Quality: Adherent Slough Fat Layer (Subcutaneous Tissue) Exposed: No Tendon Exposed: No Muscle Exposed: No Joint Exposed: No Bone Exposed: No Treatment Notes Wound #14 (Calcaneus) Wound Laterality: Left, Posterior Cleanser Soap and Water Discharge Instruction: May shower and wash wound with dial antibacterial soap and water prior to dressing change. Wound Cleanser Discharge Instruction: Cleanse the wound with wound cleanser or normal saline prior to applying a clean dressing using gauze sponges, not tissue or cotton balls. Peri-Wound Care Topical Primary Dressing Santyl Ointment Discharge Instruction: Apply nickel thick amount to wound bed as instructed Secondary Dressing Woven Gauze Sponge, Non-Sterile 4x4 in Discharge Instruction: Apply over primary dressing as directed. ALLEVYN Heel 4 1/2in x 5 1/2in / 10.5cm x 13.5cm Discharge Instruction: Apply over primary dressing as directed. Secured With [employer], 4.5x3.1 (in/yd) Discharge Instruction: Secure with  [employer] as directed. Paper Tape, 2x10 (in/yd) Discharge Instruction: Secure dressing with tape as directed. Compression Wrap Compression Stockings Add-Ons Electronic Signature(s) Signed: 06/15/2021 5:17:03 PM By: [name] RN, BSN Signed: 06/15/2021 5:22:33 PM By: Deon Pilling RN, BSN Entered By: [name] on 06/15/2021 08:54:01 -------------------------------------------------------------------------------- Vitals Details Patient Name: Date of Service: [name], [place]. 06/15/2021 8:35 A M Medical Record Number: [id number] Patient Account Number: [id number] Date of Birth/Sex: Treating RN: 24-Nov-1940 (80 y.o. [name], [name] Primary Care Lowell Makara: [name] Other Clinician: Referring Ashlye Oviedo: Treating Jakolby Sedivy/[name]: Tressie Stalker in Treatment: 0 Vital Signs Time Taken: 08:30 Temperature (F): 97.7 Height (in): 64 Pulse (bpm): 83 Source: Stated Respiratory Rate (breaths/min): 18 Weight (lbs): 135 Blood Pressure (mmHg): 122/67 Source: Stated Capillary Blood Glucose (mg/dl): 105 Body Mass Index (BMI): 23.2 Reference Range: 80 - 120 mg / dl Electronic Signature(s) Signed: 06/20/2021 12:59:59 PM By: Sandre Kitty Entered By: Sandre Kitty on 06/15/2021 08:31:14

## 2021-06-22 ENCOUNTER — Encounter (HOSPITAL_BASED_OUTPATIENT_CLINIC_OR_DEPARTMENT_OTHER): Payer: Medicare Other | Admitting: Internal Medicine

## 2021-06-22 ENCOUNTER — Other Ambulatory Visit: Payer: Self-pay

## 2021-06-22 DIAGNOSIS — T8131XA Disruption of external operation (surgical) wound, not elsewhere classified, initial encounter: Secondary | ICD-10-CM | POA: Diagnosis not present

## 2021-06-22 DIAGNOSIS — E11621 Type 2 diabetes mellitus with foot ulcer: Secondary | ICD-10-CM | POA: Diagnosis not present

## 2021-06-22 DIAGNOSIS — L97522 Non-pressure chronic ulcer of other part of left foot with fat layer exposed: Secondary | ICD-10-CM | POA: Diagnosis not present

## 2021-06-22 DIAGNOSIS — E11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: Secondary | ICD-10-CM | POA: Diagnosis not present

## 2021-06-22 DIAGNOSIS — I10 Essential (primary) hypertension: Secondary | ICD-10-CM | POA: Diagnosis not present

## 2021-06-22 DIAGNOSIS — L97428 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: Secondary | ICD-10-CM | POA: Diagnosis not present

## 2021-06-22 DIAGNOSIS — B965 Pseudomonas (aeruginosa) (mallei) (pseudomallei) as the cause of diseases classified elsewhere: Secondary | ICD-10-CM | POA: Diagnosis not present

## 2021-06-22 DIAGNOSIS — L97528 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ity: Secondary | ICD-10-CM | POA: Diagnosis not present

## 2021-06-23 DIAGNOSIS — M86172 Other acute osteomyelitis, left ankle and foot: Secondary | ICD-10-CM | POA: Diagnosis not present

## 2021-06-23 DIAGNOSIS — I70202 Unspecified atherosclerosis of native arteries of extremities, left leg: Secondary | ICD-10-CM | POA: Diagnosis not present

## 2021-06-23 DIAGNOSIS — D638 Anemia in other chronic diseases classified elsewhere: Secondary | ICD-10-CM | POA: Diagnosis not present

## 2021-06-23 DIAGNOSIS — E11628 Type 2 diabetes mellitus with other skin complications: Secondary | ICD-10-CM | POA: Diagnosis not present

## 2021-06-23 DIAGNOSIS — L03116 Cellulitis of left lower limb: Secondary | ICD-10-CM | POA: Diagnosis not present

## 2021-06-23 DIAGNOSIS — I10 Essential (primary) hypertension: Secondary | ICD-10-CM | POA: Diagnosis not present

## 2021-06-23 NOTE — Progress Notes (Signed)
[name], [name] ([id number]) Visit Report for 06/22/2021 Debridement Details Patient Name: Date of Service: Duard [name] 06/22/2021 8:30 A M Medical Record Number: [id number] Patient Account Number: [id number] Date of Birth/Sex: Treating RN: 09-29-1940 (80 y.o. [name], [name]: [name] Other Clinician: Referring Provider: Treating Provider/[name]: Tressie Stalker in Treatment: 1 Debridement Performed for Assessment: Wound #13 Left Amputation Site - Transmetatarsal Performed By: Physician Ricard Dillon., MD Debridement Type: Debridement Severity of Tissue Pre Debridement: Fat layer exposed Level of Consciousness (Pre-procedure): Awake and Alert Pre-procedure Verification/Time Out Yes - 09:05 Taken: Start Time: 09:06 Pain Control: Other : Benzocaine 20% T Area Debrided (L x W): otal 5.7 (cm) x 7.5 (cm) = 42.75 (cm) Tissue and other material debrided: Viable, Non-Viable, Slough, Subcutaneous, Skin: Dermis , Skin: Epidermis, Fibrin/Exudate, Slough Level: Skin/Subcutaneous Tissue Debridement Description: Excisional Instrument: Curette Bleeding: Moderate Hemostasis Achieved: Pressure End Time: 09:11 Procedural Pain: 0 Post Procedural Pain: 0 Response to Treatment: Procedure was tolerated well Level of Consciousness (Post- Awake and Alert procedure): Post Debridement Measurements of Total Wound Length: (cm) 5.7 Width: (cm) 7.5 Depth: (cm) 0.9 Volume: (cm) 30.218 Character of Wound/Ulcer Post Debridement: Improved Severity of Tissue Post Debridement: Fat layer exposed Post Procedure Diagnosis Same as Pre-procedure Electronic Signature(s) Signed: 06/22/2021 2:50:47 PM By: Deon Pilling RN, BSN Signed: 06/23/2021 3:46:44 PM By: [name] MD Entered By: [name] on 06/22/2021 09:18:51 -------------------------------------------------------------------------------- HPI Details Patient Name: Date  of Service: [name], [name] 06/22/2021 8:30 A M Medical Record Number: [id number] Patient Account Number: [id number] Date of Birth/Sex: Treating RN: 04-05-1941 (80 y.o. [name] Primary Care Provider: [name] Other Clinician: Referring Provider: Treating Provider/[name]: Tressie Stalker in Treatment: 1 History of Present Illness HPI Description: ADMISSION 07/13/2019 Patient is a 80 year old type II diabetic. She has known PAD. She has been followed by Dr. Jacqualyn Posey of podiatry for blistering areas on her toes dating back to 04/30/2019 which at this was the left fourth toe. By 8/11 she had wounds on the right and left second toes. She underwent arterial studies by Dr. Alvester Chou on 7/31 that showed ABIs on the right of 0.60 TBI of 0.26 on the left ABI of 0.56 and a TBI of 0.25. By 9/10 she had ischemic-looking wounds per Dr. Earleen Newport on the right first, left first second and third. She has been using Medihoney and then mupirocin more recently simply Betadine. The patient underwent angiography by Dr. Gwenlyn Found on 9/21. 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 Noted that there was 1 vessel runoff in the foot via the peroneal. On the left there was an 80% left SFA, 70% mid left SFA. There was a short segment calcified CTO distal less than SFA and above-knee popliteal artery reconstituting with two-vessel runoff. 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 It was felt that she had bilateral SFA disease as well as tibial vessel disease. An attempt was made to revascularize the left SFA but they were unable to cross because of the highly calcified nature of the lesion. 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 She has an area on  the medial left fourth toe with raised horned callus on top of this. I am  not certain what this represents. With regards to pain she has about a 2-1/2 I will claudication tolerance in the grocery store. She has some pain in night which is relieved by putting her feet down over the side of the bed. She is wearing Nitro-Dur patches on the top of her right foot. Past medical history includes type 2 diabetes with secondary PAD, neoplasm of the skin, diabetic retinopathy, carotid artery stenosis, hyperlipidemia hypertension. 11/2; the last time the patient was here I spoke to Dr. Gwenlyn Found about revascularization options on the right. As I understand things currently Dr. Gwenlyn Found spoke with Dr. Trula Slade and ultimately the patient was taken to surgery on 10/27. She had a right iliofemoral endarterectomy with a bovine patch angioplasty. I think the plan now is for her to have a staged intervention on the right SFA by Dr. Alvester Chou. Per the patient's understanding Dr. Gwenlyn Found and Dr. Jacqualyn Posey are waiting to see when the gangrenous toes on the right foot can be amputated. The patient states her pain is a lot better and she is grateful for that. 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 Small area on the left fourth toe. She is using Betadine to the mummified areas on the right and Medihoney on the left 08/27/2019; the last time I saw this patient I discharged her from the clinic. She had been revascularized by Dr. Trula Slade and she had a right iliofemoral endarterectomy with a bovine angioplasty. 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 Jacqualyn Posey of podiatry on 08/07/2019. I note that she also had a intervention by Dr. Gwenlyn Found and he performed a directional atherectomy and drug-coated balloon angioplasty of the SFA and popliteal artery on the right. I am not certain of the date of Dr. Kennon Holter procedure as of the time of this dictation. She was  referred back to [place] by Dr. Earleen Newport predominantly for follow-up of the left fourth toe. She still has sutures and stitches in the right TMA site. She states her pain is a lot better. She expresses concern about the condition of the amputation site at the TMA. She is on doxycycline I think prescribed by Dr. Earleen Newport. She is complaining of some pain at night 12/10; I spoke to Dr. Jacqualyn Posey last week. He removed the sutures on the right foot on Monday of this week. She has the area on the left fourth toe just proximal to the PIP and then the right TMA site. She is still on doxycycline and has enough through next week. Unfortunately the TMA site does not look good at all. Both on the lateral and medial part of the incisions are areas that probe to bone. There is purulence over the medial part which I have cultured. We will use silver alginate. Left fourth toe looks somewhat better but there was still exposed bone 12/17; patient has an MRI booked for 12/30. Culture I did last week showed Pseudomonas Serratia and Enterococcus. This was purulent drainage coming out of the medial part of her amputation site. I use cefdinir 300 twice daily for 10 days that started on 12/15. Her x-ray on the right showed limited evaluation for osteomyelitis. The findings could have been postoperative. There was subtle erosion in the distal first and distal fifth metatarsal. An MRI was suggested. On the left she had irregularity of the fourth middle and proximal phalanx consistent with a history of osteomyelitis. I do not know that she has a  history of osteomyelitis in this area. She had a newly defined area on the plantar third toe 12/31; patient's MRI is listed below: MRI OF THE RIGHT FOREFOOT WITHOUT AND WITH CONTRAST TECHNIQUE: Multiplanar, multisequence MR imaging of the right forefoot was performed before and after the administration of intravenous contrast. CONTRAST 6 mL GADAVIST IV SOLN : COMPARISON: Plain films right  foot 09/04/2019. FINDINGS: Bones/Joint/Cartilage The patient is status post transmetatarsal amputation as seen on the prior exam. Marrow edema and enhancement are seen in all of the distal metatarsals. In the first metatarsal, signal change extends 1.5 cm proximal to the stump and in the second metatarsal extends approximately 2 cm proximal to the stump. Edema and enhancement are seen in only the distal 0.7 cm of the third metatarsal stump and tips of the fourth and fifth metatarsals. Bone marrow signal is otherwise unremarkable. A small focus of subchondral edema is seen in the lateral talus, likely degenerative. Ligaments Intact. Muscles and Tendons No intramuscular fluid collection. Soft tissues Skin ulceration is seen off the stump of the first metatarsal. A thin fluid collection tracks deep to the wound and over the anterior metatarsals worrisome for small abscess. Intense subcutaneous edema and enhancement are seen diffusely. 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 Cellulitis about the foot. Skin ulceration over the distal first metatarsal is identified with a thin fluid collection tracking anteriorly along the stump worrisome for abscess. Small focus of subchondral edema in the lateral talus is likely degenerative. Electronically Signed By: Inge Rise M.D. On: 09/23/2019 15:25 Patient arrives in clinic today not complaining any of any pain. 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 She does not describe claudication however her activity is very limited. 10/01/2019; since the patient was last here I have communicated with Dr. Gwenlyn Found after bypass by Dr. Trula Slade and addressing the right superficial femoral artery he states that she is widely patent  through peroneal arteries to the ankle with collaterals to the dorsalis pedis. He states he is going to talk to colleagues about the feasibility of tibial pedal access. The patient seems infectious disease later this afternoon Dr. Baxter Flattery. in preparation for this she has been off antibiotics for 1 week and I went ahead and obtain pieces of the remanence of her first metatarsal for pathology and CandS. The patient is a candidate for hyperbaric oxygen. 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 1/15; considerable improvement in the wounds on her feet. We are using silver collagen. She follows with Dr. Baxter Flattery of infectious disease and is on meropenem and daptomycin. She has been taught how to do this herself at home. Follows with Dr. Graylon Good at the end of treatment here. She has 2 wounds on the surgical TMA site 1 lateral and 1 medial the lateral 1 has regressed tremendously. The area medially still has some exposed bone although the base of this looks healthy. 1/22; 2 separate wounds on the surgical TMA site. Both of these look satisfactory. The medial area does not have any exposed bone. This is an improvement On the left side fourth toe dorsally over the proximal phalanx there is a deep punched out area that probes to bone. She has an area on the tip of the left third toe. She also tells [place] that in HBO she traumatized  her left Achilles and this is left her with a superficial wound area 1/28; weekly visit along with HBO. She has 5 wounds. T our punched out areas on the original TMA site on the right. Both of these appear to have contracted. o The area on the right no longer has exposed bone. She has an area on the tip of the left third toe and on the DIP of the left fourth toe. Both of these had surface callus that I removed and unfortunately they have small areas that both go to bone. She has a traumatic wound on the left Achilles area that happened in HBO  and that appears better. She is tolerating her IV antibiotics well at home. She has home health changing the dressings and she is doing it once on the weekends. We have been using silver collagen. She has been extensively revascularized on the right by Dr. Trula Slade and subsequently by Dr. Gwenlyn Found. 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 Baxter Flattery of infectious disease on 2/17. Dr. Gwenlyn Found on 2/23 and according to her her antibiotics stopped on 2/23. She is tolerating hyperbarics well. She has made a tremendous improvement in the right forefoot with only 2 smaller open wounds. Using silver collagen. On the left foot she has the area on the tip of the left third toe and the medial part of the left fourth toe. These had exposed bone last week I did not sense any of that today 2/12; sees Dr. Graylon Good on 2/17 and Dr. Gwenlyn Found on 2/23. We are going to use Dermagraft on this today however the lateral part of her train TMA incision on the right is healed and the medial part is down so much that we just continue with the silver collagen She has wounds on the tip of her left third and the medial aspect of the left fourth. Both of these still have exposed bone I have not been able to get these to epithelialize. 2/19; she sees Dr. Gwenlyn Found on 2/23 and I have communicated with him about the left vascular supply. Looking at her angiogram from 08/03/2019 it looks as though that they could not cross the left CFA. Noted that she had one-vessel runoff bilaterally. She also apparently saw Dr. Graylon Good although I did not look up these results for preparation of this record. 2/26; Dr. Gwenlyn Found is going to do an angiogram next week on Wednesday. I think they are going to try to go at this both retrograde and anterograde to see if anything can be done to revascularize the left lower limb. She continues to make  nice progress on the remaining wound on the right medial foot on her TMA site and the toe it wounds on the left are responding nicely as well 3/12; the patient underwent an extensive and complicated revascularization on the left leg by Dr. Fletcher Anon on 3/3; she had an atherectomy on occluded left popliteal artery; anterior tibial artery followed by a balloon angioplasty. Atherectomy was also performed to the left SFA because there was still significant 50% stenosis in the left popliteal artery they performed an intravascular lithotripsy which improved the residual stenosis to 20%. The same lithotripsy was used to dilate the proximal left SFA. She had a drug-eluting stent placed I believe in the SFA. The patient returned for hyperbarics this week. She has had some eye problems  on the right which she tells me are secondary to diabetic retinopathy and she saw her eye doctor. She is really made excellent progress there is no open wounds on the left foot at all. She has one open area medially and her TMA site on the right lateral wound is closed. 3/19; the patient comes in today with the area on the medial transmet on the right improved. Some debris removed from the surface revealed the still open wound. Unfortunately she now has the open area on the third toe tip and the medial aspect of the dorsal fourth toe. These are in the same location as her previous wounds. These had actually closed up last week. 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 She is undergone complex revascularizations on the right by Dr. Trula Slade and Dr. Gwenlyn Found and I believe Dr. 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 She completed IVantibiotics as  directed by infectious disease and I believe a course of oral antibiotics as well.she underwent a complete 40 treatments of hyperbarics. She comes in today with the vast majority of the right TMA site healed there is a small opening on the most medial aspect. Her left third and left fourth toes appear to have epithelialization although this has fluctuated somewhat. 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 She had a gaping wound on the right transmetatarsal amputation L as well as deep wounds on the left third and left fourth toes. She underwent complex revascularizations on her bilateral lower legs which I described on her last visit. She completed IV antibiotics by infectious disease and 40 treatments of hyperbarics. Her left third and fourth toes are healed. The area on the right transmetatarsal amputation site is also closed today. READMISSION 08/04/2020 [name] is now a 80 year old woman that we had for a complex stay in our clinic from October 2000 14 January 2020. 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 Her current problems with wounds began on October 30 when she developed a spontaneous opening over the right transmet medial aspect over the first metatarsal head. She has been using collagen on this that she had leftover from her last stay in this clinic. She also has had an open area on the medial right calf from a vein harvest site from her CABG she. She said that this is never really closed since the vein harvest was done. With regards to her arterial status this is a major issue. She had an angiogram by Dr. Gwenlyn Found in September 2020. She developed a gangrenous right first and second toes. Ultimately she was referred to Dr. Trula Slade who did a right common femoral endarterectomy with patch angioplasty. This was on 07/21/2019 and she  really had a good result. Dr. Gwenlyn Found did a atherectomy followed by a drug coated balloon angioplasty of her right SFA popliteal and tibial peroneal trunk on 08/03/2019 again with excellent excellent results. Follow-up.Dopplers in November revealed revealed a widely patent SFA and tibioperoneal trunk. UNFORTUNATELY her recent angiogram that was done on 08/01/2020 showed an 80% right common femoral artery  stenosis just above the previous endarterectomy site occluded right right SFA at the origin reconstituting the adductor canal by the profunda femoris collaterals. The profunda femoris is also diffusely diseased. There is and again another 80% tibioperoneal trunk stenosis and one-vessel runoff via the peroneal artery. She is seen Dr. Donzetta Matters and she is scheduled for a femoral endarterectomy and right femoral-popliteal bypass grafting with endarterectomy of her tibial peroneal trunk. The scheduled surgery is on 11/23 The patient does not complain of pain at rest. She does have 5-minute walking claudication. The wounds on her right first met head remanent was spontaneous she does not think she hit this. As mentioned in the area on her right medial calf is a vein harvest site Dr. Gwenlyn Found had texted me about getting her in the clinic and we have arranged this as soon as we can. I have told her that we will have to see how successful Dr. Donzetta Matters is before we know what can be done about the wounds in a precise fashion. Fortunately there does not seem to be any obvious infection involving the right foot although that may need to be looked at if things really deteriorate. She was treated with IV antibiotics and hyperbaric oxygen for her previous osteomyelitis 12/2; the patient underwent a right femoral endarterectomy and bypass femoral to popliteal artery. This was done on 08/16/2020. She is done remarkably well. She has a small area on the right anterior tibia and a small area in the medial part of her original right  transmetatarsal amputation. She appears to have had an excellent response to surgery. 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 Boss is a now 80 year old woman who arrives for review of wounds on the left TMA site extending into her plantar foot as well as an area on the left posterior calcaneus. 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 She was extensively revascularized on the right at that time as noted above in our previous notes ended up receiving IV antibiotics and hyperbaric oxygen therapy. Miraculously this TMA site actually closed and she really has had not any trouble with this since. UNFORTUNATELY the same cannot be said of her left side. Her problem apparently started sometime in July when she hit her left third toe on a barstool while playing pool. She developed an acute infection which eventually led to underlying osteomyelitis. She underwent an extensive hospitalization from 04/27/2021 through 05/15/2021. She received IV antibiotics. Also extensive intervention by Dr. Stanford Breed of vein and vascular which included a left common femoral to tibioperoneal trunk bypass as well as a left third toe amputation on 05/03/2021. Unfortunately the area did not heal she ended up requiring a left TMA. By her description this was left open. She has been using a wound VAC on this to earlier this week and then was switched to Santyl. I think because of the left forefoot off loader she is also developed an area on the left posterior calcaneus. She is not complaining of a lot of pain. She has her daughter at home to change the dressings. She has been prescribed Santyl and she has a tube that she is brought into our clinic. I cannot get a lot of history of claudication although she is  really not that mobile at this point walking with  a walker. 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 She has had multiple interventions on the right side by vein and vascular and also by Dr. Gwenlyn Found as well as a transmetatarsal amputation on the right. She has had follow-up noninvasive studies on 06/06/2021; on the left that showed an ABI of 0.47 with monophasic waveforms a dorsalis pedis ABI of 0.40 with monophasic waveforms. Her ANGIOGRAM which was initially done by Dr. Oneida Alar on 04/28/2021 showed severe left foot superficial femoral artery occlusive disease and an occlusion of the left popliteal artery with two-vessel runoff to the left foot with moderate to severe disease below the knee popliteal artery and tibial disease. She had 80% distal right external iliac artery above the existing femoral above-knee popliteal bypass but she does not have an open wound on the right foot She arrives in clinic with an extensive open area on the TMA site which in the mid aspect extends into the plantar foot. She also has a more superficial area on the upper part of her Achilles area of the left heel 9/29; patient arrives in clinic today with slough over the TMA site. We use MolecuLight to look at the surface of this suggesting cyan and white discoloration over a large part of this wound also extending into the third metatarsal area. She also has the area on the left posterior calcaneus. This is slough covered we will switch to [facility] here as well. There is some warmth in the area and some erythema we will give her antibiotics as well Electronic Signature(s) Signed: 06/23/2021 3:46:44 PM By: [name] MD Entered By: [name] on 06/22/2021 09:20:20 -------------------------------------------------------------------------------- Physical Exam Details Patient  Name: Date of Service: [name], [name] 06/22/2021 8:30 A M Medical Record Number: [id number] Patient Account Number: [id number] Date of Birth/Sex: Treating RN: Feb 07, 1941 (80 y.o. [name] Primary Care Provider: [name] Other Clinician: Referring Provider: Treating Provider/[name]: Tressie Stalker in Treatment: 1 Constitutional Sitting or standing Blood Pressure is within target range for patient.. Pulse regular and within target range for patient.Marland Kitchen Respirations regular, non-labored and within target range.. Temperature is normal and within the target range for the patient.Marland Kitchen Appears in no distress. Notes Wound exam; substantial wound over the left TMA site. Using MolecuLight demonstrated cyan fluorescence along with white suggesting the presence of Pseudomonas. This extends into the bottom of the foot at the third toe amputation site I believe. Some warmth around the wound I was not particularly happy with this minimal erythema. I used a #5 curette to extensively debride the surface of the TMA wound. The right heel wound is 100% slough covered I did not debride this today but I will apply Santyl to this area Electronic Signature(s) Signed: 06/23/2021 3:46:44 PM By: [name] MD Entered By: [name] on 06/22/2021 09:21:49 -------------------------------------------------------------------------------- Physician Orders Details Patient Name: Date of Service: [name], [place]. 06/22/2021 8:30 A M Medical Record Number: [id number] Patient Account Number: [id number] Date of Birth/Sex: Treating RN: 02-01-1941 (80 y.o. 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 - with Dr. Dellia Nims Return A Pick up oral antibiotics from pharmacy. Bathing/ Shower/ Hygiene May shower and wash wound with soap and water. Off-Loading Open toe surgical shoe to: - right foot Wedge shoe to: - left foot Home Health New wound care orders this week; continue Home Health for wound care. May utilize formulary equivalent dressing for wound treatment orders unless otherwise specified. - Change primary dressing to Santyl on left transmet and heel wounds Other Home Health Orders/Instructions: - [facility] 3x a week Wound Treatment Wound #13 - Amputation Site - Transmetatarsal Wound Laterality: Left Cleanser: Soap and Water 1 x Per Day/7 Days Discharge Instructions: May shower and wash wound with dial antibacterial soap and water prior to dressing change. Cleanser: Wound Cleanser (Home Health) 1 x Per Day/7 Days Discharge Instructions: Cleanse the wound with wound cleanser or normal saline prior to applying a clean dressing using gauze sponges, not tissue or cotton balls. Prim Dressing: KerraCel Ag Gelling Fiber Dressing, 4x5 in (silver alginate) (Home Health) 1 x Per Day/7 Days ary Discharge Instructions: Apply silver alginate over the santyl. Prim Dressing: Santyl Ointment 1 x Per Day/7 Days ary Discharge Instructions: Apply nickel thick amount to wound bed as instructed Secondary Dressing: Woven Gauze Sponge, Non-Sterile 4x4 in (Home Health) 1 x Per Day/7 Days Discharge Instructions: Apply over primary dressing as directed. Secondary Dressing: ABD Pad, 5x9 (Home Health) 1 x  Per Day/7 Days Discharge Instructions: Apply over primary dressing as directed. Secured With: [employer], 4.5x3.1 (in/yd) (Home Health) 1 x Per Day/7 Days Discharge Instructions: Secure with Kerlix as directed. Secured With: Paper Tape, 2x10 (in/yd) (Home Health) 1 x Per Day/7 Days Discharge Instructions: Secure dressing with tape as directed. Wound #14 - Calcaneus Wound Laterality: Left, Posterior Cleanser: Soap and Water 1 x Per Day/7 Days Discharge Instructions: May shower and wash wound with dial antibacterial soap and water prior to dressing change. Cleanser: Wound Cleanser (Home Health) 1 x Per Day/7 Days Discharge Instructions: Cleanse the wound with wound cleanser or normal saline prior to applying a clean dressing using gauze sponges, not tissue or cotton balls. Prim Dressing: KerraCel Ag Gelling Fiber Dressing, 4x5 in (silver alginate) (Home Health) 1 x Per Day/7 Days ary Discharge Instructions: Apply silver alginate over santyl. Prim Dressing: Santyl Ointment 1 x Per Day/7 Days ary Discharge Instructions: Apply nickel thick amount to wound bed as instructed Secondary Dressing: Woven Gauze Sponge, Non-Sterile 4x4 in (Home Health) 1 x Per Day/7 Days Discharge Instructions: Apply over primary dressing as directed. Secondary Dressing: ALLEVYN Heel 4 1/2in x 5 1/2in / 10.5cm x 13.5cm (Home Health) 1 x Per Day/7 Days Discharge Instructions: Apply over primary dressing as directed. Secured With: [employer], 4.5x3.1 (in/yd) (Home Health) 1 x Per Day/7 Days Discharge Instructions: Secure with Kerlix as directed. Secured With: Paper Tape, 2x10 (in/yd) (Home Health) 1 x Per Day/7 Days Discharge Instructions: Secure dressing with tape as directed. Patient Medications llergies: Demerol, scopolamine, Bactrim, Versed, tramadol A Notifications Medication Indication Start End 06/22/2021 Cipro DOSE oral 500 mg tablet - 1 tablet oral BID x7 days Electronic  Signature(s) Signed: 06/22/2021 2:50:47 PM By: Deon Pilling RN, BSN Signed: 06/23/2021 3:46:44 PM By: [name] MD Entered By: Deon Pilling on 06/22/2021 09:32:58 Prescription 06/22/2021 -------------------------------------------------------------------------------- Armandina Stammer  Carlena Hurl MD Patient Name: Provider: 1940-10-16 [id number] Date of Birth: NPI#: [id number] Sex: DEA #: [phone] Phone #: License #: [place] Patient Address: [address] [place], [place] [address] [address], [place] [address] [phone] Allergies Demerol; scopolamine; Bactrim; Versed; tramadol Medication Medication: Route: Strength: Form: Cipro oral 500 mg tablet Class: QUINOLONE ANTIBIOTICS Dose: Frequency / Time: Indication: 1 tablet oral BID x7 days Number of Refills: Number of Units: 0 Generic Substitution: Start Date: End Date: Administered at Facility: Substitution Permitted 4/97/0263 No Note to Pharmacy: Hand Signature: Date(s): Electronic Signature(s) Signed: 06/22/2021 2:50:47 PM By: Deon Pilling RN, BSN Signed: 06/23/2021 3:46:44 PM By: [name] MD Entered By: Deon Pilling on 06/22/2021 09:32:58 -------------------------------------------------------------------------------- Problem List Details Patient Name: Date of Service: [name], [name] 06/22/2021 8:30 A M Medical Record Number: [id number] Patient Account Number: [id number] Date of Birth/Sex: Treating RN: [birth year] (80 y.o. [name], [name] Primary Care Provider: [name] Other Clinician: Referring Provider: Treating Provider/[name]: Tressie Stalker in Treatment: 1 Active Problems ICD-10 Encounter Code Description Active Date MDM Diagnosis E11.621 Type 2 diabetes mellitus with foot ulcer 06/15/2021 No Yes T81.31XD Disruption of external operation (surgical) wound, not  elsewhere classified, 06/15/2021 No Yes subsequent encounter E11.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 06/15/2021 No Yes L97.528 Non-pressure chronic ulcer of other part of left foot with other specified 06/15/2021 No Yes severity L97.428 Non-pressure chronic ulcer of left heel and midfoot with other specified 06/15/2021 No Yes severity Inactive Problems Resolved Problems Electronic Signature(s) Signed: 06/23/2021 3:46:44 PM By: [name] MD Entered By: [name] on 06/22/2021 09:18:26 -------------------------------------------------------------------------------- Progress Note Details Patient Name: Date of Service: [name], [name] 06/22/2021 8:30 A M Medical Record Number: [id number] Patient Account Number: [id number] Date of Birth/Sex: Treating RN: 1940/12/05 (80 y.o. [name] Primary Care Provider: [name] Other Clinician: Referring Provider: Treating Provider/[name]: Tressie Stalker in Treatment: 1 Subjective History of Present Illness (HPI) ADMISSION 07/13/2019 Patient is a 80 year old type II diabetic. She has known PAD. She has been followed by Dr. Jacqualyn Posey of podiatry for blistering areas on her toes dating back to 04/30/2019 which at this was the left fourth toe. By 8/11 she had wounds on the right and left second toes. She underwent arterial studies by Dr. Alvester Chou on 7/31 that showed ABIs on the right of 0.60 TBI of 0.26 on the left ABI of 0.56 and a TBI of 0.25. By 9/10 she had ischemic-looking wounds per Dr. Earleen Newport on the right first, left first second and third. She has been using Medihoney and then mupirocin more recently simply Betadine. The patient underwent angiography by Dr. Gwenlyn Found on 9/21. 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 Noted that there was 1  vessel runoff in the foot via the peroneal. ooOn the left there was an 80% left SFA, 70% mid left SFA. There was a short segment calcified CTO distal less than SFA and above-knee popliteal artery reconstituting with two-vessel runoff. 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 It was felt that she had bilateral SFA disease as well as tibial vessel disease. An attempt was made to revascularize the left SFA but they were unable to cross because of the highly calcified nature of the lesion. 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 She has an area on the medial left fourth toe with raised horned callus on top of this. I am not certain what this represents. With regards to pain she has about a 2-1/2 I will claudication tolerance in the grocery store. She has some pain in night which is relieved by putting her feet down over the side of the bed. She is wearing Nitro-Dur patches on the top of her right foot. Past medical history includes type 2 diabetes with secondary PAD, neoplasm of the skin, diabetic retinopathy, carotid artery stenosis, hyperlipidemia hypertension. 11/2; the last time the patient was here I spoke to Dr. Gwenlyn Found about revascularization options on the right. As I understand things currently Dr. Gwenlyn Found spoke with Dr. Trula Slade and ultimately the patient was taken to surgery on 10/27. She had a right iliofemoral endarterectomy with a bovine patch angioplasty. I think the plan now is for her to have a staged intervention on the right SFA by Dr. Alvester Chou. Per the patient's understanding Dr. Gwenlyn Found and Dr. Jacqualyn Posey are waiting to see when the gangrenous toes on the right foot can be amputated. The patient states her pain is a lot better and she is grateful for that. 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 Small area on the left fourth toe.  She is using Betadine to the mummified areas on the right and Medihoney on the left 08/27/2019; the last time I saw this patient I discharged her from the clinic. She had been revascularized by Dr. Trula Slade and she had a right iliofemoral endarterectomy with a bovine angioplasty. 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 Jacqualyn Posey of podiatry on 08/07/2019. I note that she also had a intervention by Dr. Gwenlyn Found and he performed a directional atherectomy and drug-coated balloon angioplasty of the SFA and popliteal artery on the right. I am not certain of the date of Dr. Kennon Holter procedure as of the time of this dictation. She was referred back to [place] by Dr. Earleen Newport predominantly for follow-up of the left fourth toe. She still has sutures and stitches in the right TMA site. She states her pain is a lot better. She expresses concern about the condition of the amputation site at the TMA. She is on doxycycline I think prescribed by Dr. Earleen Newport. She is complaining of some pain at night 12/10; I spoke to Dr. Jacqualyn Posey last week. He removed the sutures on the right foot on Monday of this week. She has the area on the left fourth toe just proximal to the PIP and then the right TMA site. She is still on doxycycline and has enough through next week. Unfortunately the TMA site does not look good at all. Both on the lateral and medial part of the incisions are areas that probe to bone. There is purulence over the medial part which I have cultured. We will use silver alginate. Left fourth toe looks somewhat better but there was still exposed bone 12/17; patient has an MRI booked for 12/30. Culture I did last week showed Pseudomonas Serratia and Enterococcus. This was purulent drainage coming out of the medial part of her amputation site. I use cefdinir 300 twice daily for 10 days that started on 12/15. Her x-ray on the right showed limited evaluation for osteomyelitis. The  findings could have been postoperative. There was subtle erosion in the distal first and distal fifth metatarsal. An MRI was suggested. On the left she had irregularity of the fourth middle and proximal phalanx consistent with a history of osteomyelitis. I do not know that she has a history of osteomyelitis in this area. She had a newly defined area on the plantar third toe 12/31; patient's MRI is listed below: MRI OF THE RIGHT FOREFOOT WITHOUT AND WITH CONTRAST TECHNIQUE: Multiplanar, multisequence MR imaging of the right forefoot was performed before and after the administration of intravenous contrast. CONTRAST 6 mL GADAVIST IV SOLN : COMPARISON: Plain films right foot 09/04/2019. FINDINGS: Bones/Joint/Cartilage The patient is status post transmetatarsal amputation as seen on the prior exam. Marrow edema and enhancement are seen in all of the distal metatarsals. In the first metatarsal, signal change extends 1.5 cm proximal to the stump and in the second metatarsal extends approximately 2 cm proximal to the stump. Edema and enhancement are seen in only the distal 0.7 cm of the third metatarsal stump and tips of the fourth and fifth metatarsals. Bone marrow signal is otherwise unremarkable. A small focus of subchondral edema is seen in the lateral talus, likely degenerative. Ligaments Intact. Muscles and Tendons No intramuscular fluid collection. Soft tissues Skin ulceration is seen off the stump of the first metatarsal. A thin fluid collection tracks deep to the wound and over the anterior metatarsals worrisome for small abscess. Intense subcutaneous edema and enhancement are seen diffusely. 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 Cellulitis about the foot. Skin ulceration over the distal first metatarsal is identified with a thin  fluid collection tracking anteriorly along the stump worrisome for abscess. Small focus of subchondral edema in the lateral talus is likely degenerative. Electronically Signed By: Inge Rise M.D. On: 09/23/2019 15:25 Patient arrives in clinic today not complaining any of any pain. 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 She does not describe claudication however her activity is very limited. 10/01/2019; since the patient was last here I have communicated with Dr. Gwenlyn Found after bypass by Dr. Trula Slade and addressing the right superficial femoral artery he states that she is widely patent through peroneal arteries to the ankle with collaterals to the dorsalis pedis. He states he is going to talk to colleagues about the feasibility of tibial pedal access. The patient seems infectious disease later this afternoon Dr. Baxter Flattery. in preparation for this she has been off antibiotics for 1 week and I went ahead and obtain pieces of the remanence of her first metatarsal for pathology and CandS. The patient is a candidate for hyperbaric oxygen. 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 1/15; considerable improvement in the wounds on her feet. We are using silver collagen. She follows with Dr. Baxter Flattery of infectious disease and is on meropenem and daptomycin. She has been taught how to do this herself at home. Follows with Dr. Graylon Good at the end of treatment here. She has 2 wounds on the surgical TMA site 1 lateral and 1 medial the lateral 1 has regressed tremendously. The area medially still has some exposed bone although the base of this looks healthy. 1/22; 2 separate wounds on the surgical TMA site. Both of these look satisfactory. The medial area does not have any exposed bone.  This is an improvement On the left side fourth toe dorsally over the proximal phalanx there is a deep  punched out area that probes to bone. She has an area on the tip of the left third toe. She also tells [place] that in HBO she traumatized her left Achilles and this is left her with a superficial wound area 1/28; weekly visit along with HBO. She has 5 wounds. T our punched out areas on the original TMA site on the right. Both of these appear to have contracted. o The area on the right no longer has exposed bone. She has an area on the tip of the left third toe and on the DIP of the left fourth toe. Both of these had surface callus that I removed and unfortunately they have small areas that both go to bone. She has a traumatic wound on the left Achilles area that happened in HBO and that appears better. She is tolerating her IV antibiotics well at home. She has home health changing the dressings and she is doing it once on the weekends. We have been using silver collagen. She has been extensively revascularized on the right by Dr. Trula Slade and subsequently by Dr. Gwenlyn Found. 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 Baxter Flattery of infectious disease on 2/17. Dr. Gwenlyn Found on 2/23 and according to her her antibiotics stopped on 2/23. She is tolerating hyperbarics well. She has made a tremendous improvement in the right forefoot with only 2 smaller open wounds. Using silver collagen. On the left foot she has the area on the tip of the left third toe and the medial part of the left fourth toe. These had exposed bone last week I did not sense any of that today 2/12; sees Dr. Graylon Good on 2/17 and Dr. Gwenlyn Found on 2/23. We are going to use Dermagraft on this today however the lateral part of her train TMA incision on the right is healed and the medial part is down so much that we just continue with the silver collagen She has wounds on the tip of her left third and the medial aspect of the left  fourth. Both of these still have exposed bone I have not been able to get these to epithelialize. 2/19; she sees Dr. Gwenlyn Found on 2/23 and I have communicated with him about the left vascular supply. Looking at her angiogram from 08/03/2019 it looks as though that they could not cross the left CFA. Noted that she had one-vessel runoff bilaterally. She also apparently saw Dr. Graylon Good although I did not look up these results for preparation of this record. 2/26; Dr. Gwenlyn Found is going to do an angiogram next week on Wednesday. I think they are going to try to go at this both retrograde and anterograde to see if anything can be done to revascularize the left lower limb. She continues to make nice progress on the remaining wound on the right medial foot on her TMA site and the toe it wounds on the left are responding nicely as well 3/12; the patient underwent an extensive and complicated revascularization on the left leg by Dr. Fletcher Anon on 3/3; she had an atherectomy on occluded left popliteal artery; anterior tibial artery followed by a balloon angioplasty. Atherectomy was also performed to the left SFA because there was still significant 50% stenosis in the left popliteal artery  they performed an intravascular lithotripsy which improved the residual stenosis to 20%. The same lithotripsy was used to dilate the proximal left SFA. She had a drug-eluting stent placed I believe in the SFA. The patient returned for hyperbarics this week. She has had some eye problems on the right which she tells me are secondary to diabetic retinopathy and she saw her eye doctor. She is really made excellent progress there is no open wounds on the left foot at all. She has one open area medially and her TMA site on the right lateral wound is closed. 3/19; the patient comes in today with the area on the medial transmet on the right improved. Some debris removed from the surface revealed the still open wound. ooUnfortunately she now has  the open area on the third toe tip and the medial aspect of the dorsal fourth toe. These are in the same location as her previous wounds. These had actually closed up last week. 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 She is undergone complex revascularizations on the right by Dr. Trula Slade and Dr. Gwenlyn Found and I believe Dr. 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 She completed IVantibiotics as directed by infectious disease and I believe a course of oral antibiotics as well.she underwent a complete 40 treatments of hyperbarics. She comes in today with the vast majority of the right TMA site healed there is a small opening on the most medial aspect. Her left third and left fourth toes appear to have epithelialization although this has fluctuated somewhat. 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 She had a gaping wound on the right transmetatarsal amputation L as well as deep wounds on the left third and left fourth toes. She underwent complex revascularizations on her bilateral lower legs which I described on her last visit. She completed IV antibiotics by infectious disease and 40 treatments of hyperbarics. Her left third and fourth toes are healed. The area on the right transmetatarsal amputation site is also closed today. READMISSION 08/04/2020 [name] is now a 80 year old woman that we had for a complex stay in our clinic from October 2000 14 January 2020. 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 Her current problems with wounds began on October 30  when she developed a spontaneous opening over the right transmet medial aspect over the first metatarsal head. She has been using collagen on this that she had leftover from her last stay in this clinic. She also has had an open area on the medial right calf from a vein harvest site from her CABG she. She said that this is never really closed since the vein harvest was done. With regards to her arterial status this is a major issue. She had an angiogram by Dr. Gwenlyn Found in September 2020. She developed a gangrenous right first and second toes. Ultimately she was referred to Dr. Trula Slade who did a right common femoral endarterectomy with patch angioplasty. This was on 07/21/2019 and she really had a good result. Dr. Gwenlyn Found did a atherectomy followed by a drug  coated balloon angioplasty of her right SFA popliteal and tibial peroneal trunk on 08/03/2019 again with excellent excellent results. Follow-up.Dopplers in November revealed revealed a widely patent SFA and tibioperoneal trunk. UNFORTUNATELY her recent angiogram that was done on 08/01/2020 showed an 80% right common femoral artery stenosis just above the previous endarterectomy site occluded right right SFA at the origin reconstituting the adductor canal by the profunda femoris collaterals. The profunda femoris is also diffusely diseased. There is and again another 80% tibioperoneal trunk stenosis and one-vessel runoff via the peroneal artery. She is seen Dr. Donzetta Matters and she is scheduled for a femoral endarterectomy and right femoral-popliteal bypass grafting with endarterectomy of her tibial peroneal trunk. The scheduled surgery is on 11/23 The patient does not complain of pain at rest. She does have 5-minute walking claudication. The wounds on her right first met head remanent was spontaneous she does not think she hit this. As mentioned in the area on her right medial calf is a vein harvest site Dr. Gwenlyn Found had texted me about getting her in the clinic and  we have arranged this as soon as we can. I have told her that we will have to see how successful Dr. Donzetta Matters is before we know what can be done about the wounds in a precise fashion. Fortunately there does not seem to be any obvious infection involving the right foot although that may need to be looked at if things really deteriorate. She was treated with IV antibiotics and hyperbaric oxygen for her previous osteomyelitis 12/2; the patient underwent a right femoral endarterectomy and bypass femoral to popliteal artery. This was done on 08/16/2020. She is done remarkably well. She has a small area on the right anterior tibia and a small area in the medial part of her original right transmetatarsal amputation. She appears to have had an excellent response to surgery. 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 Neira is a now 80 year old woman who arrives for review of wounds on the left TMA site extending into her plantar foot as well as an area on the left posterior calcaneus. 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 She was extensively revascularized on the right at that time as noted above in our previous notes ended up receiving IV antibiotics and hyperbaric oxygen therapy. Miraculously this TMA site actually closed and she really has had not any trouble with this since. UNFORTUNATELY the same cannot be said of her left side. Her problem apparently started sometime in July when she hit her left third toe on a barstool while playing pool. She developed an acute infection which eventually led to underlying osteomyelitis. She underwent an extensive hospitalization from 04/27/2021 through 05/15/2021. She received IV antibiotics. Also extensive intervention by Dr. Stanford Breed of vein and  vascular which included a left common femoral to tibioperoneal trunk bypass as well as a left third toe amputation on 05/03/2021. Unfortunately the area did not heal she ended up requiring a left TMA. By her description this was left open. She has been using a wound VAC on this to earlier this week and then was switched to Santyl. I think because of the left forefoot off loader she is also developed an area on the left posterior calcaneus. She is not complaining of a lot of pain. She  has her daughter at home to change the dressings. She has been prescribed Santyl and she has a tube that she is brought into our clinic. I cannot get a lot of history of claudication although she is really not that mobile at this point walking with a walker. 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 She has had multiple interventions on the right side by vein and vascular and also by Dr. Gwenlyn Found as well as a transmetatarsal amputation on the right. She has had follow-up noninvasive studies on 06/06/2021; on the left that showed an ABI of 0.47 with monophasic waveforms a dorsalis pedis ABI of 0.40 with monophasic waveforms. Her ANGIOGRAM which was initially done by Dr. Oneida Alar on 04/28/2021 showed severe left foot superficial femoral artery occlusive disease and an occlusion of the left popliteal artery with two-vessel runoff to the left foot with moderate to severe disease below the knee popliteal artery and tibial disease. She had 80% distal right external iliac artery above the existing femoral above-knee popliteal bypass but she does not have an open wound on the right foot She arrives in clinic with an extensive open area on the TMA site which in the mid aspect extends into the plantar foot. She also has a more superficial area on the upper part of her Achilles area of the left  heel 9/29; patient arrives in clinic today with slough over the TMA site. We use MolecuLight to look at the surface of this suggesting cyan and white discoloration over a large part of this wound also extending into the third metatarsal area. She also has the area on the left posterior calcaneus. This is slough covered we will switch to [facility] here as well. There is some warmth in the area and some erythema we will give her antibiotics as well Objective Constitutional Sitting or standing Blood Pressure is within target range for patient.. Pulse regular and within target range for patient.Marland Kitchen Respirations regular, non-labored and within target range.. Temperature is normal and within the target range for the patient.Marland Kitchen Appears in no distress. Vitals Time Taken: 8:30 AM, Height: 64 in, Weight: 135 lbs, BMI: 23.2, Temperature: 97.5 F, Pulse: 90 bpm, Respiratory Rate: 20 breaths/min, Blood Pressure: 157/56 mmHg, Capillary Blood Glucose: 90 mg/dl. General Notes: Wound exam; substantial wound over the left TMA site. Using MolecuLight demonstrated cyan fluorescence along with white suggesting the presence of Pseudomonas. This extends into the bottom of the foot at the third toe amputation site I believe. Some warmth around the wound I was not particularly happy with this minimal erythema. I used a #5 curette to extensively debride the surface of the TMA wound. oo The right heel wound is 100% slough covered I did not debride this today but I will apply Santyl to this area Integumentary (Hair, Skin) Wound #13 status is Open. Original cause of wound was Surgical Injury. The date acquired was: 05/11/2021. The wound has been in treatment 1 weeks. The wound is located on the Left Amputation Site - Transmetatarsal. The wound measures 5.7cm length x 7.5cm width x 0.9cm depth; 33.576cm^2 area and 30.218cm^3 volume. There is Fat Layer (Subcutaneous Tissue) exposed. There is no tunneling or undermining noted. There is  a large amount of purulent drainage noted. The wound margin is flat and intact. There is medium (34-66%) red, pink granulation within the wound bed. There is a medium (34-66%)  amount of necrotic tissue within the wound bed including Adherent Slough. Wound #14 status is Open. Original cause of wound was Pressure Injury. The date acquired was: 05/11/2021. The wound has been in treatment 1 weeks. The wound is located on the Left,Posterior Calcaneus. The wound measures 0.9cm length x 1.1cm width x 0.1cm depth; 0.778cm^2 area and 0.078cm^3 volume. There is Fat Layer (Subcutaneous Tissue) exposed. There is no tunneling or undermining noted. There is a medium amount of purulent drainage noted. The wound margin is flat and intact. There is no granulation within the wound bed. There is a large (67-100%) amount of necrotic tissue within the wound bed including Adherent Slough. 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 There was a Excisional Skin/Subcutaneous Tissue Debridement with a total area of 42.75 sq cm performed by Ricard Dillon., MD. With the following instrument(s): Curette to remove Viable and Non-Viable tissue/material. Material removed includes Subcutaneous Tissue, Slough, Skin: Dermis, Skin: Epidermis, and Fibrin/Exudate after achieving pain control using Other (Benzocaine 20%). A time out was conducted  at 09:05, prior to the start of the procedure. A Moderate amount of bleeding was controlled with Pressure. The procedure was tolerated well with a pain level of 0 throughout and a pain level of 0 following the procedure. Post Debridement Measurements: 5.7cm length x 7.5cm width x 0.9cm depth; 30.218cm^3 volume. Character of Wound/Ulcer Post Debridement is improved. Severity of Tissue Post Debridement is: Fat layer exposed. Post procedure Diagnosis Wound #13: Same as Pre-Procedure Plan Follow-up Appointments: Return Appointment in 1 week. - with Dr. Doyne Keel up oral antibiotics from pharmacy. Bathing/ Shower/ Hygiene: May shower and wash wound with soap and water. Off-Loading: Open toe surgical shoe to: - right foot Wedge shoe to: - left foot Home Health: New wound care orders this week; continue Home Health for wound care. May utilize formulary equivalent dressing for wound treatment orders unless otherwise specified. - Change primary dressing to Santyl on left transmet and heel wounds Other Home Health Orders/Instructions: - [facility] 3x a week WOUND #13: - Amputation Site - Transmetatarsal Wound Laterality: Left Cleanser: Soap and Water 1 x Per Day/7 Days Discharge Instructions: May shower and wash wound with dial antibacterial soap and water prior to dressing change. Cleanser: Wound Cleanser (Home Health) 1 x Per Day/7 Days Discharge Instructions: Cleanse the wound with wound cleanser or normal saline prior to applying a clean dressing using gauze sponges, not tissue or cotton balls. Prim Dressing: KerraCel Ag Gelling Fiber Dressing, 4x5 in (silver alginate) (Home Health) 1 x Per Day/7 Days ary Discharge Instructions: Apply silver alginate over the santyl. Prim Dressing: Santyl Ointment 1 x Per Day/7 Days ary Discharge Instructions: Apply nickel thick amount to wound bed as instructed Secondary Dressing: Woven Gauze Sponge, Non-Sterile 4x4 in (Home Health) 1 x Per Day/7  Days Discharge Instructions: Apply over primary dressing as directed. Secondary Dressing: ABD Pad, 5x9 (Home Health) 1 x Per Day/7 Days Discharge Instructions: Apply over primary dressing as directed. Secured With: [employer], 4.5x3.1 (in/yd) (  Home Health) 1 x Per Day/7 Days Discharge Instructions: Secure with Kerlix as directed. Secured With: Paper T ape, 2x10 (in/yd) (Home Health) 1 x Per Day/7 Days Discharge Instructions: Secure dressing with tape as directed. WOUND #14: - Calcaneus Wound Laterality: Left, Posterior Cleanser: Soap and Water 1 x Per Day/7 Days Discharge Instructions: May shower and wash wound with dial antibacterial soap and water prior to dressing change. Cleanser: Wound Cleanser (Home Health) 1 x Per Day/7 Days Discharge Instructions: Cleanse the wound with wound cleanser or normal saline prior to applying a clean dressing using gauze sponges, not tissue or cotton balls. Prim Dressing: KerraCel Ag Gelling Fiber Dressing, 4x5 in (silver alginate) (Home Health) 1 x Per Day/7 Days ary Discharge Instructions: Apply silver alginate over santyl. Prim Dressing: Santyl Ointment 1 x Per Day/7 Days ary Discharge Instructions: Apply nickel thick amount to wound bed as instructed Secondary Dressing: Woven Gauze Sponge, Non-Sterile 4x4 in (Home Health) 1 x Per Day/7 Days Discharge Instructions: Apply over primary dressing as directed. Secondary Dressing: ALLEVYN Heel 4 1/2in x 5 1/2in / 10.5cm x 13.5cm (Home Health) 1 x Per Day/7 Days Discharge Instructions: Apply over primary dressing as directed. Secured With: [employer], 4.5x3.1 (in/yd) (Home Health) 1 x Per Day/7 Days Discharge Instructions: Secure with Kerlix as directed. Secured With: Paper T ape, 2x10 (in/yd) (Home Health) 1 x Per Day/7 Days Discharge Instructions: Secure dressing with tape as directed. 1. MolecuLight suggested the presence of Pseudomonas over the wound aggressive debridement done Santyl  and silver alginate to this area and to the area on the heel. 2. The area on the posterior heel will require debridement next week 3. Empiric ciprofloxacin 500 twice daily for 7 days cannot rule out a topical antibiotic next week I would like to repeat the MolecuLight when she comes in next week as well. Deep tissue cultures also possible. MolecuLight DX: 1st Scanned Wound Fluorescence bacterial imaging was medically necessary today due to Wound shows signs of deterioration such as increased drainage/slough/odor or (Indication): an increase in size MolecuLight Results Cyan Colors., Green Colors The indicated colors were noted in the following area(s). In the periphery of the wound, In the center of the wound MolecuLight DX: 2nd Scanned Wound As a result of todays scan, the following treatment plans were put in place. Aggressive debridement, oral antibiotics MolecuLight Procedure The MolecularLight DX device was cleaned with a disinfectant wipe prior to use., The correct patient profile was confirmed and correct wound was verified., Range finder sensor used to ensure appropriate distance selected The following was completed: between imaging unit and wound bed, Room lights were turned off and the ambient light sensor was checked., Blue circle appeared around the lightbulb., The fluorescence icon was selected. Screen was tapped to enhance focus and the image was captured. Additional drapes were used to ensure adequate darknesso No Potential ICD-10 Codes ICD-10 B96.5 pseudomonas (Cyan Color) Additional Scanned Wounds Did you scan any additional Woundso No Electronic Signature(s) Signed: 06/23/2021 3:46:44 PM By: [name] MD Entered By: [name] on 06/22/2021 09:31:46 -------------------------------------------------------------------------------- SuperBill Details Patient Name: Date of Service: [name], [name] 06/22/2021 Medical Record Number: [id number] Patient Account  Number: [id number] Date of Birth/Sex: Treating RN: January 07, 1941 (80 y.o. 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 Facility Procedures CPT4 Code: 20100712 Description: 19758 - DEB SUBQ TISSUE 20 SQ CM/< ICD-10 Diagnosis Description L97.528 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i T81.31XD Disruption of external operation (surgical) wound, not elsewhere classified,  subs Modifier: ty equent encounter Quantity: 1 CPT4 Code: 83254982 Description: 11045 - DEB SUBQ TISS EA ADDL 20CM ICD-10 Diagnosis Description L97.528 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i T81.31XD Disruption of external operation (surgical) wound, not elsewhere classified,  subs Modifier: ty equent encounter Quantity: 2 CPT4 Code: 64158309 Description: 0598T NONCNTACT RT FLORO WND 1ST STE ICD-10 Diagnosis Description L97.528 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i T81.31XD Disruption of external operation (surgical) wound, not elsewhere classified,  subs Modifier: 59 ty equent encounter Quantity: 1 Physician Procedures : CPT4 Code Description Modifier [id number] 11042 - WC PHYS SUBQ TISS 20 SQ CM ICD-10 Diagnosis Description L97.528 Non-pressure chronic ulcer of other part of left foot with other  specified severity T81.31XD Disruption of external operation (surgical)  wound, not elsewhere classified, subsequent encounter Quantity: 1 : [id number] 94585 - WC PHYS SUBQ TISS EA ADDL 20 CM ICD-10 Diagnosis Description L97.528 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ity T81.31XD Disruption of external operation (surgical) wound, not elsewhere  classified, subsequent encounter Quantity: 2 : [id number] NONCNTACT RT FLORO WND 1ST STE 59 ICD-10 Diagnosis Description L97.528 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ity T81.31XD Disruption of external operation (surgical) wound, not elsewhere classified,  subsequent encounter Quantity: 1 Electronic Signature(s) Signed: 06/23/2021 3:46:44 PM By: [name] MD Entered By: [name] on 06/22/2021 09:26:04

## 2021-06-23 NOTE — Progress Notes (Signed)
[name], [name] ([id number]) Visit Report for 06/22/2021 Arrival Information Details Patient Name: Date of Service: [name] 06/22/2021 8:30 A M Medical Record Number: [id number] Patient Account Number: [id number] Date of Birth/Sex: Treating RN: 08-07-1941 (80 y.o. [name], [name] Primary Care Demya Scruggs: [name] Other Clinician: Referring Jessiah Wojnar: Treating Maurita Havener/[name]: Tressie Stalker in Treatment: 1 Visit Information History Since Last Visit Added or deleted any medications: No Patient Arrived: [name]: No Arrival Time: 08:26 Had a fall or experienced change in No Accompanied By: self activities of daily living that may affect Transfer Assistance: None risk of falls: Patient Identification Verified: Yes Signs or symptoms of abuse/neglect since No Secondary Verification Process Completed: Yes last visito Patient Requires Transmission-Based Precautions: No Hospitalized since last visit: No Patient Has Alerts: Yes Implantable device outside of the clinic No Patient Alerts: Patient on Blood Thinner excluding L ABI: 0.82 cellular tissue based products placed in the R ABI: 0.47 center since last visit: Has Dressing in Place as Prescribed: Yes Has Footwear/Offloading in Place as Yes Prescribed: Left: Surgical [name] with Pressure Relief Insole Right: Wedge [name] Pain Present Now: No Electronic Signature(s) Signed: 06/22/2021 2:50:47 PM By: Deon Pilling RN, BSN Entered By: Deon Pilling on 06/22/2021 08:26:47 -------------------------------------------------------------------------------- Encounter Discharge Information Details Patient Name: Date of Service: [name], [name]. 06/22/2021 8:30 A M Medical Record Number: [id number] Patient Account Number: [id number] Date of Birth/Sex: Treating RN: 1941/08/07 (80 y.o. [name] Primary Care Orion Mole: [name] Other  Clinician: Referring Charlea Nardo: Treating [name]/[name]: Tressie Stalker in Treatment: 1 Encounter Discharge Information Items Post Procedure Vitals Discharge Condition: Stable Temperature (F): 97.5 Ambulatory Status: Walker Pulse (bpm): 90 Discharge Destination: Home Respiratory Rate (breaths/min): 20 Transportation: Private Auto Blood Pressure (mmHg): 157/56 Accompanied By: self Schedule Follow-up Appointment: Yes Clinical Summary of Care: Electronic Signature(s) Signed: 06/22/2021 2:50:47 PM By: Deon Pilling RN, BSN Entered By: Deon Pilling on 06/22/2021 09:17:50 -------------------------------------------------------------------------------- Lower Extremity Assessment Details Patient Name: Date of Service: [name], [name] 06/22/2021 8:30 A M Medical Record Number: [id number] Patient Account Number: [id number] Date of Birth/Sex: Treating RN: Dec 18, 1940 (80 y.o. [name], [name] Primary Care [name]: [name] Other Clinician: Referring [name]: Treating Tyyne Cliett/[name]: Tressie Stalker in Treatment: 1 Edema Assessment Assessed: Shirlyn Goltz: Yes] Patrice Paradise: No] Edema: [Left: N] [Right: o] Calf Left: Right: Point of Measurement: From Medial Instep 31 cm Ankle Left: Right: Point of Measurement: From Medial Instep 20 cm Vascular Assessment Pulses: Dorsalis Pedis Palpable: [Left:Yes] Electronic Signature(s) Signed: 06/22/2021 2:50:47 PM By: Deon Pilling RN, BSN Entered By: Deon Pilling on 06/22/2021 08:35:32 -------------------------------------------------------------------------------- Multi Wound Chart Details Patient Name: Date of Service: [name], [name] 06/22/2021 8:30 A M Medical Record Number: [id number] Patient Account Number: [id number] Date of Birth/Sex: Treating RN: 1940-12-13 (80 y.o. [name], [name] Primary Care Meekah Math: [name] Other Clinician: Referring  Marti Acebo: Treating Jendayi Berling/[name]: Tressie Stalker in Treatment: 1 Vital Signs Height(in): 62 Capillary Blood Glucose(mg/dl): 90 Weight(lbs): 135 Pulse(bpm): 59 Body Mass Index(BMI): 23 Blood Pressure(mmHg): 157/56 Temperature(F): 97.5 Respiratory Rate(breaths/min): 20 Photos: [13:No Photos Left Amputation Site - Transmetatarsal Left, Posterior Calcaneus] [14:No Photos] [N/A:N/A N/A] Wound Location: [13:Surgical Injury] [14:Pressure Injury] [N/A:N/A] Wounding Event: [13:Diabetic Wound/Ulcer of the Lower] [14:Diabetic Wound/Ulcer of the Lower] [N/A:N/A] Primary Etiology: [13:Extremity Open Surgical Wound] [14:Extremity N/A] [N/A:N/A] Secondary Etiology: [13:Coronary Artery Disease, Deep Vein Coronary Artery Disease, Deep Vein N/A] Comorbid History: [13:Thrombosis, Hypertension,  Myocardial Thrombosis, Hypertension, Myocardial Infarction, Peripheral Arterial Disease, Infarction, Peripheral Arterial Disease, Type II Diabetes 05/11/2021] [14:Type II Diabetes 05/11/2021] [N/A:N/A] Date Acquired: [13:1] [14:1] [N/A:N/A] Weeks of Treatment: [13:Open] [14:Open] [N/A:N/A] Wound Status: [13:5.7x7.5x0.9] [14:0.9x1.1x0.1] [N/A:N/A] Measurements L x W x D (cm) [13:33.576] [14:0.778] [N/A:N/A] A (cm) : rea [13:30.218] [14:0.078] [N/A:N/A] Volume (cm) : [13:12.30%] [14:43.70%] [N/A:N/A] % Reduction in A [13:rea: 12.30%] [14:43.50%] [N/A:N/A] % Reduction in Volume: [13:Grade 2] [14:Unable to visualize wound bed] [N/A:N/A] Classification: [13:Large] [14:Medium] [N/A:N/A] Exudate A mount: [13:Purulent] [14:Purulent] [N/A:N/A] Exudate Type: [13:yellow, brown, green] [14:yellow, brown, green] [N/A:N/A] Exudate Color: [13:Flat and Intact] [14:Flat and Intact] [N/A:N/A] Wound Margin: [13:Medium (34-66%)] [14:None Present (0%)] [N/A:N/A] Granulation A mount: [13:Red, Pink] [14:N/A] [N/A:N/A] Granulation Quality: [13:Medium (34-66%)] [14:Large (67-100%)] [N/A:N/A] Necrotic A  mount: [13:Fat Layer (Subcutaneous Tissue): Yes Fat Layer (Subcutaneous Tissue): Yes N/A] Exposed Structures: [13:Fascia: No Tendon: No Muscle: No Joint: No Bone: No None] [14:Fascia: No Tendon: No Muscle: No Joint: No Bone: No None] [N/A:N/A] Epithelialization: [13:Debridement - Excisional] [14:N/A] [N/A:N/A] Debridement: Pre-procedure Verification/Time Out 09:05 [14:N/A] [N/A:N/A] Taken: [13:Other] [14:N/A] [N/A:N/A] Pain Control: [13:Subcutaneous, Slough] [14:N/A] [N/A:N/A] Tissue Debrided: [13:Skin/Subcutaneous Tissue] [14:N/A] [N/A:N/A] Level: [13:42.75] [14:N/A] [N/A:N/A] Debridement A (sq cm): [13:rea Curette] [14:N/A] [N/A:N/A] Instrument: [13:Moderate] [14:N/A] [N/A:N/A] Bleeding: [13:Pressure] [14:N/A] [N/A:N/A] Hemostasis A chieved: [13:0] [14:N/A] [N/A:N/A] Procedural Pain: [13:0] [14:N/A] [N/A:N/A] Post Procedural Pain: [13:Procedure was tolerated well] [14:N/A] [N/A:N/A] Debridement Treatment Response: [13:5.7x7.5x0.9] [14:N/A] [N/A:N/A] Post Debridement Measurements L x W x D (cm) [13:30.218] [14:N/A] [N/A:N/A] Post Debridement Volume: (cm) [13:Debridement] [14:N/A] [N/A:N/A] Treatment Notes Wound #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 Wound Cleanser Discharge Instruction: Cleanse the wound with wound cleanser or normal saline prior to applying a clean dressing using gauze sponges, not tissue or cotton balls. Peri-Wound Care Topical Primary Dressing KerraCel Ag Gelling Fiber Dressing, 4x5 in (silver alginate) Discharge Instruction: Apply silver alginate over the santyl. Santyl Ointment Discharge Instruction: Apply nickel thick amount to wound bed as instructed Secondary Dressing Woven Gauze Sponge, Non-Sterile 4x4 in Discharge Instruction: Apply over primary dressing as directed. ABD Pad, 5x9 Discharge  Instruction: Apply over primary dressing as directed. Secured With [employer], 4.5x3.1 (in/yd) Discharge Instruction: Secure with Kerlix as directed. Paper Tape, 2x10 (in/yd) Discharge Instruction: Secure dressing with tape as directed. Compression Wrap Compression Stockings Add-Ons Wound #14 (Calcaneus) Wound Laterality: Left, Posterior Cleanser Soap and Water Discharge Instruction: May shower and wash wound with dial antibacterial soap and water prior to dressing change. Wound Cleanser Discharge Instruction: Cleanse the wound with wound cleanser or normal saline prior to applying a clean dressing using gauze sponges, not tissue or cotton balls. Peri-Wound Care Topical Primary Dressing KerraCel Ag Gelling Fiber Dressing, 4x5 in (silver alginate) Discharge Instruction: Apply silver alginate over santyl. Santyl Ointment Discharge Instruction: Apply nickel thick amount to wound bed as instructed Secondary Dressing Woven Gauze Sponge, Non-Sterile 4x4 in Discharge Instruction: Apply over primary dressing as directed. ALLEVYN Heel 4 1/2in x 5 1/2in / 10.5cm x 13.5cm Discharge Instruction: Apply over primary dressing as directed. Secured With [employer], 4.5x3.1 (in/yd) Discharge Instruction: Secure with Kerlix as directed. Paper Tape, 2x10 (in/yd) Discharge Instruction: Secure dressing with tape as directed. Compression Wrap Compression Stockings Add-Ons Electronic Signature(s) Signed: 06/22/2021 2:50:47 PM By: Deon Pilling RN, BSN Signed: 06/23/2021 3:46:44 PM By: [name] MD Entered By: [name] on 06/22/2021 09:18:38 -------------------------------------------------------------------------------- Multi-Disciplinary  Care Plan Details Patient Name: Date of Service: [name] 06/22/2021 8:30 A M Medical Record Number: [id number] Patient Account Number: [id number] Date of Birth/Sex: Treating RN: [birth year] (80 y.o. [name],  [name] Primary Care Glorious Flicker: [name] Other Clinician: Referring Metta Koranda: Treating Olsen Mccutchan/[name]: Tressie Stalker in Treatment: 1 [place] reviewed with physician Active Inactive Nutrition Nursing Diagnoses: Impaired glucose control: actual or potential Potential for alteratiion in Nutrition/Potential for imbalanced nutrition Goals: Patient/caregiver agrees to and verbalizes understanding of need to use nutritional supplements and/or vitamins as prescribed Date Initiated: 06/15/2021 Target Resolution Date: 07/14/2021 Goal Status: Active Patient/caregiver will maintain therapeutic glucose control Date Initiated: 06/15/2021 Target Resolution Date: 07/14/2021 Goal Status: Active Interventions: Assess HgA1c results as ordered upon admission and as needed Assess patient nutrition upon admission and as needed per policy Provide education on elevated blood sugars and impact on wound healing Provide education on nutrition Treatment Activities: Education provided on Nutrition : 06/15/2021 Notes: Wound/Skin Impairment Nursing Diagnoses: Impaired tissue integrity Knowledge deficit related to ulceration/compromised skin integrity Goals: Patient/caregiver will verbalize understanding of skin care regimen Date Initiated: 06/15/2021 Target Resolution Date: 07/14/2021 Goal Status: Active Interventions: Assess patient/caregiver ability to obtain necessary supplies Assess patient/caregiver ability to perform ulcer/skin care regimen upon admission and as needed Assess ulceration(s) every visit Provide education on ulcer and skin care Notes: Electronic Signature(s) Signed: 06/22/2021 2:50:47 PM By: Deon Pilling RN, BSN Entered By: Deon Pilling on 06/22/2021 08:40:17 -------------------------------------------------------------------------------- Pain Assessment Details Patient Name: Date of Service: [name], Elberta Leatherwood H. 06/22/2021  8:30 A M Medical Record Number: [id number] Patient Account Number: [id number] Date of Birth/Sex: Treating RN: [birth year] (80 y.o. [name] Primary Care Kye Silverstein: [name] Other Clinician: Referring Adrianne Shackleton: Treating Kya Mayfield/[name]: Tressie Stalker in Treatment: 1 Active Problems Location of Pain Severity and Description of Pain Patient Has Paino No Site Locations Rate the pain. Rate the pain. Current Pain Level: 0 Pain Management and Medication Current Pain Management: Medication: No Cold Application: No Rest: No Massage: No Activity: No T.E.N.S.: No Heat Application: No Leg drop or elevation: No Is the Current Pain Management Adequate: Adequate How does your wound impact your activities of daily livingo Sleep: No Bathing: No Appetite: No Relationship With Others: No Bladder Continence: No Emotions: No Bowel Continence: No Work: No Toileting: No Drive: No Dressing: No Hobbies: No Engineer, maintenance) Signed: 06/22/2021 2:50:47 PM By: Deon Pilling RN, BSN Entered By: Deon Pilling on 06/22/2021 08:26:59 -------------------------------------------------------------------------------- Patient/Caregiver Education Details Patient Name: Date of Service: [name], [name] 9/29/2022andnbsp8:30 A M Medical Record Number: [id number] Patient Account Number: [id number] Date of Birth/Gender: Treating RN: 1940-10-22 (80 y.o. [name] Primary Care Physician: [name] Other Clinician: Referring Physician: Treating Physician/[name]: Tressie Stalker in Treatment: 1 Education Assessment Education Provided To: Patient Education Topics Provided Elevated Blood Sugar/ Impact on Healing: Handouts: Elevated Blood Sugars: How Do They Affect Wound Healing Methods: Explain/Verbal Responses: Reinforcements needed Electronic Signature(s) Signed: 06/22/2021 2:50:47 PM By: Deon Pilling  RN, BSN Entered By: Deon Pilling on 06/22/2021 08:40:28 -------------------------------------------------------------------------------- Wound Assessment Details Patient Name: Date of Service: [name], [place]. 06/22/2021 8:30 A M Medical Record Number: [id number] Patient Account Number: [id number] Date of Birth/Sex: Treating RN: December 05, 1940 (80 y.o. 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 Secondary Open Surgical Wound Wounding Event: Surgical Injury Etiology: Date Acquired: 05/11/2021 Wound Open Weeks Of Treatment: 1 Status: Clustered Wound: No Comorbid Coronary Artery Disease, Deep Vein Thrombosis, Hypertension, History: Myocardial Infarction, Peripheral Arterial Disease, Type II Diabetes Photos Wound Measurements Length: (cm) 5.7 Width: (cm) 7.5 Depth: (cm) 0.9 Area: (cm) 33.576 Volume: (cm) 30.218 % Reduction in Area: 12.3% % Reduction in Volume: 12.3% Epithelialization: None Tunneling: No Undermining: No Wound Description Classification: Grade 2 Wound Margin: Flat and Intact Exudate Amount: Large Exudate Type: Purulent Exudate Color: yellow, brown, green Foul Odor After Cleansing: No Slough/Fibrino Yes Wound Bed Granulation Amount: Medium (34-66%) Exposed Structure Granulation Quality: Red, Pink Fascia Exposed: No Necrotic Amount: Medium (34-66%) Fat Layer (Subcutaneous Tissue) Exposed: Yes Necrotic Quality: Adherent Slough Tendon Exposed: No Muscle Exposed: No Joint Exposed: No Bone Exposed: No Treatment Notes Wound #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 Wound Cleanser Discharge Instruction: Cleanse the wound with wound cleanser or normal saline prior to applying a clean dressing using gauze sponges, not tissue or cotton balls. Peri-Wound Care Topical Primary Dressing KerraCel Ag Gelling Fiber Dressing, 4x5 in (silver alginate) Discharge Instruction: Apply silver alginate over the santyl. Santyl Ointment Discharge Instruction: Apply nickel thick amount to wound bed as instructed Secondary Dressing Woven Gauze Sponge, Non-Sterile 4x4 in Discharge Instruction: Apply over primary dressing as directed. ABD Pad, 5x9 Discharge Instruction: Apply over primary dressing as directed. Secured With [employer], 4.5x3.1 (in/yd) Discharge Instruction: Secure with Kerlix as directed. Paper Tape, 2x10 (in/yd) Discharge Instruction: Secure dressing with tape as directed. Compression Wrap Compression Stockings Add-Ons Electronic Signature(s) Signed: 06/22/2021 2:50:47 PM By: Deon Pilling RN, BSN Entered By: Deon Pilling on 06/22/2021 09:20:34 -------------------------------------------------------------------------------- Wound Assessment Details Patient Name: Date of Service: [name], [place]. 06/22/2021 8:30 A M Medical Record Number: [id number] Patient Account Number: [id number] Date of Birth/Sex: Treating RN: 16-Jan-1941 (80 y.o. [name], [name] Primary Care Wei Poplaski: [name] Other Clinician: Referring Julias Mould: Treating Shatisha Falter/[name]: Tressie Stalker in Treatment: 1 Wound Status Wound Number: 14 Primary Diabetic Wound/Ulcer of the Lower Extremity Etiology: Wound Location: Left, Posterior Calcaneus Wound Open Wounding Event: Pressure Injury Status: Date Acquired: 05/11/2021 Comorbid Coronary Artery Disease, Deep Vein Thrombosis, Hypertension, Weeks Of Treatment: 1 History: Myocardial Infarction, Peripheral Arterial Disease, Type  II Diabetes Clustered Wound: No Photos Wound Measurements Length: (cm) 0.9 Width: (cm) 1.1 Depth: (cm) 0.1 Area: (cm) 0.778 Volume: (cm) 0.078 % Reduction in Area: 43.7% % Reduction in Volume: 43.5% Epithelialization: None Tunneling: No Undermining: No Wound Description Classification: Unable to visualize wound bed Wound Margin: Flat and Intact Exudate Amount: Medium Exudate Type: Purulent Exudate Color: yellow, brown, green Foul Odor After Cleansing: No Slough/Fibrino Yes Wound Bed Granulation Amount: None Present (0%) Exposed Structure Necrotic Amount: Large (67-100%) Fascia Exposed: No Necrotic Quality: Adherent Slough Fat Layer (Subcutaneous Tissue) Exposed: Yes Tendon Exposed: No Muscle Exposed: No Joint Exposed: No Bone Exposed: No Treatment Notes Wound #14 (Calcaneus) Wound Laterality: Left, Posterior Cleanser Soap and Water Discharge Instruction: May shower and wash wound with dial antibacterial soap and water prior to dressing change. Wound Cleanser Discharge Instruction: Cleanse the wound with wound cleanser or normal saline prior to applying a clean dressing using gauze sponges, not tissue or cotton balls. Peri-Wound Care Topical Primary Dressing KerraCel Ag Gelling Fiber Dressing, 4x5  in (silver alginate) Discharge Instruction: Apply silver alginate over santyl. Santyl Ointment Discharge Instruction: Apply nickel thick amount to wound bed as instructed Secondary Dressing Woven Gauze Sponge, Non-Sterile 4x4 in Discharge Instruction: Apply over primary dressing as directed. ALLEVYN Heel 4 1/2in x 5 1/2in / 10.5cm x 13.5cm Discharge Instruction: Apply over primary dressing as directed. Secured With [employer], 4.5x3.1 (in/yd) Discharge Instruction: Secure with Kerlix as directed. Paper Tape, 2x10 (in/yd) Discharge Instruction: Secure dressing with tape as directed. Compression Wrap Compression Stockings Add-Ons Electronic  Signature(s) Signed: 06/22/2021 2:50:47 PM By: Deon Pilling RN, BSN Signed: 06/22/2021 6:20:31 PM By: Lorrin Jackson Entered By: Lorrin Jackson on 06/22/2021 09:26:46 -------------------------------------------------------------------------------- Vitals Details Patient Name: Date of Service: [name], [name] 06/22/2021 8:30 A M Medical Record Number: [id number] Patient Account Number: [id number] Date of Birth/Sex: Treating RN: 02-Jun-1941 (80 y.o. [name], [name] Primary Care Kymir Coles: [name] Other Clinician: Referring Deanette Tullius: Treating Allex Lapoint/[name]: Tressie Stalker in Treatment: 1 Vital Signs Time Taken: 08:30 Temperature (F): 97.5 Height (in): 64 Pulse (bpm): 90 Weight (lbs): 135 Respiratory Rate (breaths/min): 20 Body Mass Index (BMI): 23.2 Blood Pressure (mmHg): 157/56 Capillary Blood Glucose (mg/dl): 90 Reference Range: 80 - 120 mg / dl Electronic Signature(s) Signed: 06/22/2021 2:50:47 PM By: Deon Pilling RN, BSN Entered By: Deon Pilling on 06/22/2021 08:30:59

## 2021-06-26 DIAGNOSIS — I10 Essential (primary) hypertension: Secondary | ICD-10-CM | POA: Diagnosis not present

## 2021-06-26 DIAGNOSIS — M86172 Other acute osteomyelitis, left ankle and foot: Secondary | ICD-10-CM | POA: Diagnosis not present

## 2021-06-26 DIAGNOSIS — L03116 Cellulitis of left lower limb: Secondary | ICD-10-CM | POA: Diagnosis not present

## 2021-06-26 DIAGNOSIS — E11628 Type 2 diabetes mellitus with other skin complications: Secondary | ICD-10-CM | POA: Diagnosis not present

## 2021-06-26 DIAGNOSIS — I70202 Unspecified atherosclerosis of native arteries of extremities, left leg: Secondary | ICD-10-CM | POA: Diagnosis not present

## 2021-06-26 DIAGNOSIS — D638 Anemia in other chronic diseases classified elsewhere: Secondary | ICD-10-CM | POA: Diagnosis not present

## 2021-06-28 ENCOUNTER — Other Ambulatory Visit (INDEPENDENT_AMBULATORY_CARE_PROVIDER_SITE_OTHER): Payer: Medicare Other

## 2021-06-28 ENCOUNTER — Other Ambulatory Visit: Payer: Self-pay

## 2021-06-28 DIAGNOSIS — D638 Anemia in other chronic diseases classified elsewhere: Secondary | ICD-10-CM | POA: Diagnosis not present

## 2021-06-28 DIAGNOSIS — E11628 Type 2 diabetes mellitus with other skin complications: Secondary | ICD-10-CM | POA: Diagnosis not present

## 2021-06-28 DIAGNOSIS — E1165 Type 2 diabetes mellitus with hyperglycemia: Secondary | ICD-10-CM | POA: Diagnosis not present

## 2021-06-28 DIAGNOSIS — M86172 Other acute osteomyelitis, left ankle and foot: Secondary | ICD-10-CM | POA: Diagnosis not present

## 2021-06-28 DIAGNOSIS — I70202 Unspecified atherosclerosis of native arteries of extremities, left leg: Secondary | ICD-10-CM | POA: Diagnosis not present

## 2021-06-28 DIAGNOSIS — I10 Essential (primary) hypertension: Secondary | ICD-10-CM | POA: Diagnosis not present

## 2021-06-28 DIAGNOSIS — L03116 Cellulitis of left lower limb: Secondary | ICD-10-CM | POA: Diagnosis not present

## 2021-06-28 LAB — BASIC METABOLIC PANEL
BUN: 29 mg/dL — ABNORMAL HIGH (ref 6–23)
CO2: 24 mEq/L (ref 19–32)
Calcium: 10.5 mg/dL (ref 8.4–10.5)
Chloride: 103 mEq/L (ref 96–112)
Creatinine, Ser: 1.26 mg/dL — ABNORMAL HIGH (ref 0.40–1.20)
GFR: 40.67 mL/min — ABNORMAL LOW (ref >60.00)
Glucose, Bld: 203 mg/dL — ABNORMAL HIGH (ref 70–99)
Potassium: 4.7 mEq/L (ref 3.5–5.1)
Sodium: 139 mEq/L (ref 135–145)

## 2021-06-28 LAB — HEMOGLOBIN A1C: Hgb A1c MFr Bld: 5.8 % (ref 4.6–6.5)

## 2021-06-29 ENCOUNTER — Encounter (HOSPITAL_BASED_OUTPATIENT_CLINIC_OR_DEPARTMENT_OTHER): Payer: Medicare Other | Attending: Internal Medicine | Admitting: Internal Medicine

## 2021-06-29 ENCOUNTER — Encounter (HOSPITAL_BASED_OUTPATIENT_CLINIC_OR_DEPARTMENT_OTHER): Payer: Medicare Other | Admitting: Internal Medicine

## 2021-06-29 ENCOUNTER — Other Ambulatory Visit: Payer: Medicare Other

## 2021-06-29 DIAGNOSIS — I2510 Atherosclerotic heart disease of native coronary artery without angina pectoris: Secondary | ICD-10-CM | POA: Insufficient documentation

## 2021-06-29 DIAGNOSIS — E11621 Type 2 diabetes mellitus with foot ulcer: Secondary | ICD-10-CM | POA: Insufficient documentation

## 2021-06-29 DIAGNOSIS — I10 Essential (primary) hypertension: Secondary | ICD-10-CM | POA: Diagnosis not present

## 2021-06-29 DIAGNOSIS — E1142 Type 2 diabetes mellitus with diabetic polyneuropathy: Secondary | ICD-10-CM | POA: Diagnosis not present

## 2021-06-29 DIAGNOSIS — L97522 Non-pressure chronic ulcer of other part of left foot with fat layer exposed: Secondary | ICD-10-CM | POA: Diagnosis not present

## 2021-06-29 DIAGNOSIS — L97429 Non-pressure chronic ulcer of left heel and midfoot with unspecified severity: Secondary | ICD-10-CM | POA: Diagnosis not present

## 2021-06-29 DIAGNOSIS — L97529 Non-pressure chronic ulcer of other part of left foot with unspecified severity: Secondary | ICD-10-CM | POA: Insufficient documentation

## 2021-06-29 DIAGNOSIS — E1169 Type 2 diabetes mellitus with other specified complication: Secondary | ICD-10-CM | POA: Diagnosis not present

## 2021-06-29 DIAGNOSIS — L89620 Pressure ulcer of left heel, unstageable: Secondary | ICD-10-CM | POA: Diagnosis not present

## 2021-06-29 DIAGNOSIS — E11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: Secondary | ICD-10-CM | POA: Insufficient documentation

## 2021-06-29 NOTE — Progress Notes (Signed)
[name], [name] ([id number]) Visit Report for 06/29/2021 Arrival Information Details Patient Name: Date of Service: [name] 06/29/2021 9:45 A M Medical Record Number: [id number] Patient Account Number: [id number] Date of Birth/Sex: Treating RN: [birth year] (80 y.o. [name] Primary Care Mason Burleigh: [name] Other Clinician: Referring Amiere Cawley: Treating Kersten Salmons/[name]: Tressie Stalker in Treatment: 2 Visit Information History Since Last Visit Added or deleted any medications: No Patient Arrived: [name] Any new allergies or adverse reactions: No Arrival Time: 09:40 Had a fall or experienced change in No Accompanied By: alone activities of daily living that may affect Transfer Assistance: None risk of falls: Patient Identification Verified: Yes Signs or symptoms of abuse/neglect since last visito No Secondary Verification Process Completed: Yes Hospitalized since last visit: No Patient Requires Transmission-Based Precautions: No Implantable device outside of the clinic excluding No Patient Has Alerts: Yes cellular tissue based products placed in the center Patient Alerts: Patient on Blood Thinner since last visit: L ABI: 0.82 Has Dressing in Place as Prescribed: Yes R ABI: 0.47 Pain Present Now: No Electronic Signature(s) Signed: 06/29/2021 6:21:03 PM By: [name] RN, BSN Entered By: [name] on 06/29/2021 09:54:08 -------------------------------------------------------------------------------- Encounter Discharge Information Details Patient Name: Date of Service: [name], [name]. 06/29/2021 9:45 A M Medical Record Number: [id number] Patient Account Number: [id number] Date of Birth/Sex: Treating RN: [birth year] (80 y.o. [name] Primary Care Shykeem Resurreccion: [name] Other Clinician: Referring Dequarius Jeffries: Treating [name] Everetts/[name]: Tressie Stalker in Treatment: 2 Encounter  Discharge Information Items Post Procedure Vitals Discharge Condition: Stable Temperature (F): 97.8 Ambulatory Status: Walker Pulse (bpm): 81 Discharge Destination: Home Respiratory Rate (breaths/min): 16 Transportation: Private Auto Blood Pressure (mmHg): 97/38 Accompanied By: alone Schedule Follow-up Appointment: Yes Clinical Summary of Care: Patient Declined Electronic Signature(s) Signed: 06/29/2021 6:21:03 PM By: [name] RN, BSN Entered By: [name] on 06/29/2021 16:35:40 -------------------------------------------------------------------------------- Lower Extremity Assessment Details Patient Name: Date of Service: [name], [name]. 06/29/2021 9:45 A M Medical Record Number: [id number] Patient Account Number: [id number] Date of Birth/Sex: Treating RN: [birth year] (80 y.o. [name] Primary Care Curtiss Mahmood: [name] Other Clinician: Referring Osiah Haring: Treating Louanna Vanliew/[name]: Tressie Stalker in Treatment: 2 Edema Assessment Assessed: [name]: No] [name]: No] Edema: [Left: N] [Right: o] Calf Left: Right: Point of Measurement: From Medial Instep 30.5 cm Ankle Left: Right: Point of Measurement: From Medial Instep 20 cm Vascular Assessment Pulses: Dorsalis Pedis Palpable: [Left:Yes] Electronic Signature(s) Signed: 06/29/2021 6:21:03 PM By: [name] RN, BSN Entered By: [name] on 06/29/2021 09:54:55 -------------------------------------------------------------------------------- Multi Wound Chart Details Patient Name: Date of Service: [name], [name]. 06/29/2021 9:45 A M Medical Record Number: [id number] Patient Account Number: [id number] Date of Birth/Sex: Treating RN: November 01, 1940 (80 y.o. [name], [name] Primary Care Cloud Graham: [name] Other Clinician: Referring Aaiden Depoy: Treating Kaileia Flow/[name]: Tressie Stalker in Treatment: 2 Vital Signs Height(in):  21 Capillary Blood Glucose(mg/dl): 60 Weight(lbs): 135 Pulse(bpm): 67 Body Mass Index(BMI): 23 Blood Pressure(mmHg): 97/38 Temperature(F): 97.8 Respiratory Rate(breaths/min): 16 Photos: [13:Left Amputation Site - Transmetatarsal Left, Posterior Calcaneus] [N/A:N/A N/A] Wound Location: [13:Surgical Injury] [14:Pressure Injury] [N/A:N/A] Wounding Event: [13:Diabetic Wound/Ulcer of the Lower] [14:Diabetic Wound/Ulcer of the Lower] [N/A:N/A] Primary Etiology: [13:Extremity Open Surgical Wound] [14:Extremity N/A] [N/A:N/A] Secondary Etiology: [13:Coronary Artery Disease, Deep Vein Coronary Artery Disease, Deep Vein N/A] Comorbid History: [13:Thrombosis, Hypertension, Myocardial Thrombosis, Hypertension, Myocardial Infarction, Peripheral Arterial Disease, Infarction, Peripheral Arterial Disease, Type II Diabetes 05/11/2021] [14:Type II Diabetes  05/11/2021] [N/A:N/A] Date Acquired: [13:2] [14:2] [N/A:N/A] Weeks of Treatment: [13:Open] [14:Open] [N/A:N/A] Wound Status: [13:5.5x7.4x0.6] [14:1.3x1.4x0.1] [N/A:N/A] Measurements L x W x D (cm) [13:31.966] [14:1.429] [N/A:N/A] A (cm) : rea [13:19.179] [14:0.143] [N/A:N/A] Volume (cm) : [13:16.50%] [14:-3.40%] [N/A:N/A] % Reduction in A [13:rea: 44.30%] [14:-3.60%] [N/A:N/A] % Reduction in Volume: [13:Grade 2] [14:Unable to visualize wound bed] [N/A:N/A] Classification: [13:Large] [14:Medium] [N/A:N/A] Exudate A mount: [13:Purulent] [14:Serous] [N/A:N/A] Exudate Type: [13:yellow, brown, green] [14:amber] [N/A:N/A] Exudate Color: [13:Flat and Intact] [14:Flat and Intact] [N/A:N/A] Wound Margin: [13:Medium (34-66%)] [14:None Present (0%)] [N/A:N/A] Granulation A mount: [13:Red, Pink] [14:N/A] [N/A:N/A] Granulation Quality: [13:Medium (34-66%)] [14:Large (67-100%)] [N/A:N/A] Necrotic A mount: [13:Fat Layer (Subcutaneous Tissue): Yes Fat Layer (Subcutaneous Tissue): Yes N/A] Exposed Structures: [13:Fascia: No Tendon: No Muscle: No Joint: No Bone: No  None] [14:Fascia: No Tendon: No Muscle: No Joint: No Bone: No None] [N/A:N/A] Epithelialization: [13:Debridement - Excisional] [14:Chemical/Enzymatic/Mechanical] [N/A:N/A] Debridement: Pre-procedure Verification/Time Out 10:12 [14:10:20] [N/A:N/A] Taken: [13:Subcutaneous, Slough] [14:N/A] [N/A:N/A] Tissue Debrided: [13:Skin/Subcutaneous Tissue] [14:N/A] [N/A:N/A] Level: [13:40.7] [14:N/A] [N/A:N/A] Debridement A (sq cm): [13:rea Curette] [14:N/A] [N/A:N/A] Instrument: [13:Minimum] [14:None] [N/A:N/A] Bleeding: [13:Pressure] [14:N/A] [N/A:N/A] Hemostasis A chieved: [13:0] [14:0] [N/A:N/A] Procedural Pain: [13:0] [14:0] [N/A:N/A] Post Procedural Pain: [13:Procedure was tolerated well] [14:Procedure was tolerated well] [N/A:N/A] Debridement Treatment Response: [13:5.5x7.4x0.6] [14:1.3x1.4x0.1] [N/A:N/A] Post Debridement Measurements L x W x D (cm) [13:19.179] [14:0.143] [N/A:N/A] Post Debridement Volume: (cm) [13:Debridement] [14:Debridement] [N/A:N/A] Treatment Notes Electronic Signature(s) Signed: 06/29/2021 5:45:05 PM By: [name] MD Signed: 06/29/2021 6:25:33 PM By: Deon Pilling RN, BSN Entered By: [name] on 06/29/2021 10:43:19 -------------------------------------------------------------------------------- Multi-Disciplinary Care Plan Details Patient Name: Date of Service: [name], [name]. 06/29/2021 9:45 A M Medical Record Number: [id number] Patient Account Number: [id number] Date of Birth/Sex: Treating RN: 11/08/1940 (80 y.o. [name] Primary Care Lance Huaracha: [name] Other Clinician: Referring Presly Steinruck: Treating Alleigh Mollica/[name]: Tressie Stalker in Treatment: 2 Multidisciplinary Care Plan reviewed with physician Active Inactive Nutrition Nursing Diagnoses: Impaired glucose control: actual or potential Potential for alteratiion in Nutrition/Potential for imbalanced nutrition Goals: Patient/caregiver agrees to  and verbalizes understanding of need to use nutritional supplements and/or vitamins as prescribed Date Initiated: 06/15/2021 Target Resolution Date: 07/14/2021 Goal Status: Active Patient/caregiver will maintain therapeutic glucose control Date Initiated: 06/15/2021 Target Resolution Date: 07/14/2021 Goal Status: Active Interventions: Assess HgA1c results as ordered upon admission and as needed Assess patient nutrition upon admission and as needed per policy Provide education on elevated blood sugars and impact on wound healing Provide education on nutrition Treatment Activities: Education provided on Nutrition : 06/15/2021 Notes: Wound/Skin Impairment Nursing Diagnoses: Impaired tissue integrity Knowledge deficit related to ulceration/compromised skin integrity Goals: Patient/caregiver will verbalize understanding of skin care regimen Date Initiated: 06/15/2021 Target Resolution Date: 07/14/2021 Goal Status: Active Interventions: Assess patient/caregiver ability to obtain necessary supplies Assess patient/caregiver ability to perform ulcer/skin care regimen upon admission and as needed Assess ulceration(s) every visit Provide education on ulcer and skin care Notes: Electronic Signature(s) Signed: 06/29/2021 6:21:03 PM By: [name] RN, BSN Entered By: [name] on 06/29/2021 10:02:42 -------------------------------------------------------------------------------- Pain Assessment Details Patient Name: Date of Service: [name], [name]. 06/29/2021 9:45 A M Medical Record Number: [id number] Patient Account Number: [id number] Date of Birth/Sex: Treating RN: 10/24/1940 (80 y.o. [name] Primary Care Psalm Arman: [name] Other Clinician: Referring Jahiem Franzoni: Treating Hollie Wojahn/[name]: Tressie Stalker in Treatment: 2 Active Problems Location of Pain Severity and Description of Pain Patient Has Paino No Site Locations Pain  Management and  Medication Current Pain Management: Electronic Signature(s) Signed: 06/29/2021 6:21:03 PM By: [name] RN, BSN Entered By: [name] on 06/29/2021 09:54:43 -------------------------------------------------------------------------------- Patient/Caregiver Education Details Patient Name: Date of Service: [name], [name] 10/6/2022andnbsp9:45 A M Medical Record Number: [id number] Patient Account Number: [id number] Date of Birth/Gender: Treating RN: January 14, 1941 (80 y.o. [name] Primary Care Physician: [name] Other Clinician: Referring Physician: Treating Physician/[name]: Tressie Stalker in Treatment: 2 Education Assessment Education Provided To: Patient Education Topics Provided Wound/Skin Impairment: Methods: Explain/Verbal Responses: State content correctly Electronic Signature(s) Signed: 06/29/2021 6:21:03 PM By: [name] RN, BSN Entered By: [name] on 06/29/2021 10:02:54 -------------------------------------------------------------------------------- Wound Assessment Details Patient Name: Date of Service: [name], [name]. 06/29/2021 9:45 A M Medical Record Number: [id number] Patient Account Number: [id number] Date of Birth/Sex: Treating RN: July 21, 1941 (80 y.o. 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 Secondary Open Surgical Wound Wounding Event: Surgical Injury Etiology: Date Acquired: 05/11/2021 Wound Open Weeks Of Treatment: 2 Status: Clustered Wound: No Comorbid Coronary Artery Disease, Deep Vein Thrombosis, Hypertension, History: Myocardial Infarction, Peripheral Arterial Disease, Type II  Diabetes Photos Wound Measurements Length: (cm) 5.5 Width: (cm) 7.4 Depth: (cm) 0.6 Area: (cm) 31.966 Volume: (cm) 19.179 % Reduction in Area: 16.5% % Reduction in Volume: 44.3% Epithelialization: None Tunneling: No Undermining: No Wound Description Classification: Grade 2 Wound Margin: Flat and Intact Exudate Amount: Large Exudate Type: Purulent Exudate Color: yellow, brown, green Foul Odor After Cleansing: No Slough/Fibrino Yes Wound Bed Granulation Amount: Medium (34-66%) Exposed Structure Granulation Quality: Red, Pink Fascia Exposed: No Necrotic Amount: Medium (34-66%) Fat Layer (Subcutaneous Tissue) Exposed: Yes Necrotic Quality: Adherent Slough Tendon Exposed: No Muscle Exposed: No Joint Exposed: No Bone Exposed: No Treatment Notes Wound #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 Wound Cleanser Discharge Instruction: Cleanse the wound with wound cleanser or normal saline prior to applying a clean dressing using gauze sponges, not tissue or cotton balls. Peri-Wound Care Topical Primary Dressing KerraCel Ag Gelling Fiber Dressing, 4x5 in (silver alginate) Discharge Instruction: Apply silver alginate over the santyl. Santyl Ointment Discharge Instruction: Apply nickel thick amount to wound bed as instructed Secondary Dressing Woven Gauze Sponge, Non-Sterile 4x4 in Discharge Instruction: Apply over primary dressing as directed. ABD Pad, 5x9 Discharge Instruction: Apply over primary dressing as directed. Secured With [employer], 4.5x3.1 (in/yd) Discharge Instruction: Secure with Kerlix as directed. Paper Tape, 2x10 (in/yd) Discharge Instruction: Secure dressing with tape as directed. Compression Wrap Compression Stockings Add-Ons Electronic Signature(s) Signed: 06/29/2021 6:21:03 PM By:  [name] RN, BSN Entered By: [name] on 06/29/2021 09:50:47 -------------------------------------------------------------------------------- Wound Assessment Details Patient Name: Date of Service: [name], [name]. 06/29/2021 9:45 A M Medical Record Number: [id number] Patient Account Number: [id number] Date of Birth/Sex: Treating RN: 24-Feb-1941 (80 y.o. [name] Primary Care Bedie Dominey: [name] Other Clinician: Referring Koree Staheli: Treating Braden Deloach/[name]: Tressie Stalker in Treatment: 2 Wound Status Wound Number: 14 Primary Diabetic Wound/Ulcer of the Lower Extremity Etiology: Wound Location: Left, Posterior Calcaneus Wound Open Wounding Event: Pressure Injury Status: Date Acquired: 05/11/2021 Comorbid Coronary Artery Disease, Deep Vein Thrombosis, Hypertension, Weeks Of Treatment: 2 History: Myocardial  Infarction, Peripheral Arterial Disease, Type II Diabetes Clustered Wound: No Photos Wound Measurements Length: (cm) 1.3 Width: (cm) 1.4 Depth: (cm) 0.1 Area: (cm) 1.429 Volume: (cm) 0.143 % Reduction in Area: -3.4% % Reduction in Volume: -3.6% Epithelialization: None Tunneling: No Undermining: No Wound Description Classification: Unable to visualize wound bed Wound Margin: Flat and Intact Exudate Amount: Medium Exudate Type: Serous Exudate Color: amber Wound Bed Granulation Amount: None Present (0%) Necrotic Amount: Large (67-100%) Foul Odor After Cleansing: No Slough/Fibrino Yes Exposed Structure Fascia Exposed: No Fat Layer (Subcutaneous Tissue) Exposed: Yes Tendon Exposed: No Muscle Exposed: No Joint Exposed: No Bone Exposed: No Treatment Notes Wound #14 (Calcaneus) Wound Laterality: Left, Posterior Cleanser Soap and Water Discharge Instruction: May shower and wash wound with dial antibacterial soap and water prior to dressing change. Wound Cleanser Discharge Instruction: Cleanse the wound  with wound cleanser or normal saline prior to applying a clean dressing using gauze sponges, not tissue or cotton balls. Peri-Wound Care Topical Primary Dressing KerraCel Ag Gelling Fiber Dressing, 4x5 in (silver alginate) Discharge Instruction: Apply silver alginate over santyl. Santyl Ointment Discharge Instruction: Apply nickel thick amount to wound bed as instructed Secondary Dressing Woven Gauze Sponge, Non-Sterile 4x4 in Discharge Instruction: Apply over primary dressing as directed. ALLEVYN Heel 4 1/2in x 5 1/2in / 10.5cm x 13.5cm Discharge Instruction: Apply over primary dressing as directed. Secured With [employer], 4.5x3.1 (in/yd) Discharge Instruction: Secure with Kerlix as directed. Paper Tape, 2x10 (in/yd) Discharge Instruction: Secure dressing with tape as directed. Compression Wrap Compression Stockings Add-Ons Electronic Signature(s) Signed: 06/29/2021 6:21:03 PM By: [name] RN, BSN Entered By: [name] on 06/29/2021 10:22:38 -------------------------------------------------------------------------------- Vitals Details Patient Name: Date of Service: [name], [name]. 06/29/2021 9:45 A M Medical Record Number: [id number] Patient Account Number: [id number] Date of Birth/Sex: Treating RN: [birth year] (80 y.o. [name] Primary Care Taylynn Easton: [name] Other Clinician: Referring Neda Willenbring: Treating Talise Sligh/[name]: Tressie Stalker in Treatment: 2 Vital Signs Time Taken: 09:40 Temperature (F): 97.8 Height (in): 64 Pulse (bpm): 81 Weight (lbs): 135 Respiratory Rate (breaths/min): 16 Body Mass Index (BMI): 23.2 Blood Pressure (mmHg): 97/38 Capillary Blood Glucose (mg/dl): 88 Reference Range: 80 - 120 mg / dl Notes glucose per pt report this am Electronic Signature(s) Signed: 06/29/2021 6:21:03 PM By: [name] RN, BSN Entered By: [name] on 06/29/2021 09:54:37

## 2021-06-29 NOTE — Progress Notes (Signed)
SHAKA, ZECH ([id number]) Visit Report for 06/29/2021 Debridement Details Patient Name: Date of Service: [name] 06/29/2021 9:45 A M Medical Record Number: [id number] Patient Account Number: [id number] Date of Birth/Sex: Treating RN: March 16, 1941 (80 y.o. [name] Primary Care Provider: [name] Other Clinician: Referring Provider: Treating Provider/[name]: Tressie Stalker in Treatment: 2 Debridement Performed for Assessment: Wound #14 Left,Posterior Calcaneus Performed By: Clinician [name], RN Debridement Type: Chemical/Enzymatic/Mechanical Agent Used: Santyl Severity of Tissue Pre Debridement: Fat layer exposed Level of Consciousness (Pre-procedure): Awake and Alert Pre-procedure Verification/Time Out Yes - 10:20 Taken: Start Time: 10:20 Bleeding: None Procedural Pain: 0 Post Procedural Pain: 0 Response to Treatment: Procedure was tolerated well Level of Consciousness (Post- Awake and Alert procedure): Post Debridement Measurements of Total Wound Length: (cm) 1.3 Width: (cm) 1.4 Depth: (cm) 0.1 Volume: (cm) 0.143 Character of Wound/Ulcer Post Debridement: Requires Further Debridement Severity of Tissue Post Debridement: Fat layer exposed Post Procedure Diagnosis Same as Pre-procedure Electronic Signature(s) Signed: 06/29/2021 5:45:05 PM By: [name] MD Signed: 06/29/2021 6:21:03 PM By: [name] RN, BSN Entered By: [name] on 06/29/2021 10:22:28 -------------------------------------------------------------------------------- Debridement Details Patient Name: Date of Service: [name], [place]. 06/29/2021 9:45 A M Medical Record Number: [id number] Patient Account Number: [id number] Date of Birth/Sex: Treating RN: September 12, 1941 (80 y.o. [name], [name] Primary Care Provider: [name] Other Clinician: Referring Provider: Treating Provider/[name]: Tressie Stalker in Treatment: 2 Debridement Performed for Assessment: Wound #13 Left Amputation Site - Transmetatarsal Performed By: Physician Ricard Dillon., MD Debridement Type: Debridement Severity of Tissue Pre Debridement: Fat layer exposed Level of Consciousness (Pre-procedure): Awake and Alert Pre-procedure Verification/Time Out Yes - 10:12 Taken: Start Time: 10:12 T Area Debrided (L x W): otal 5.5 (cm) x 7.4 (cm) = 40.7 (cm) Tissue and other material debrided: Viable, Non-Viable, Slough, Subcutaneous, Biofilm, Slough Level: Skin/Subcutaneous Tissue Debridement Description: Excisional Instrument: Curette Bleeding: Minimum Hemostasis Achieved: Pressure End Time: 10:13 Procedural Pain: 0 Post Procedural Pain: 0 Response to Treatment: Procedure was tolerated well Level of Consciousness (Post- Awake and Alert procedure): Post Debridement Measurements of Total Wound Length: (cm) 5.5 Width: (cm) 7.4 Depth: (cm) 0.6 Volume: (cm) 19.179 Character of Wound/Ulcer Post Debridement: Requires Further Debridement Severity of Tissue Post Debridement: Fat layer exposed Post Procedure Diagnosis Same as Pre-procedure Electronic Signature(s) Signed: 06/29/2021 5:45:05 PM By: [name] MD Signed: 06/29/2021 6:25:33 PM By: Deon Pilling RN, BSN Entered By: [name] on 06/29/2021 10:43:30 -------------------------------------------------------------------------------- HPI Details Patient Name: Date of Service: [name], [name]. 06/29/2021 9:45 A M Medical Record Number: [id number] Patient Account Number: [id number] Date of Birth/Sex: Treating RN: Mar 19, 1941 (80 y.o. [name] Primary Care Provider: [name] Other Clinician: Referring Provider: Treating Provider/[name]: Tressie Stalker in Treatment: 2 History of Present Illness HPI Description: ADMISSION 07/13/2019 Patient is a 80 year old type II diabetic. She has known  PAD. She has been followed by Dr. Jacqualyn Posey of podiatry for blistering areas on her toes dating back to 04/30/2019 which at this was the left fourth toe. By 8/11 she had wounds on the right and left second toes. She underwent arterial studies by Dr. Alvester Chou on 7/31 that showed ABIs on the right of 0.60 TBI of 0.26 on the left ABI of 0.56 and a TBI of 0.25. By 9/10 she had ischemic-looking wounds per Dr. Earleen Newport on the right first, left first second and third. She has been using Medihoney and then mupirocin  more recently simply Betadine. The patient underwent angiography by Dr. Gwenlyn Found on 9/21. 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 Noted that there was 1 vessel runoff in the foot via the peroneal. On the left there was an 80% left SFA, 70% mid left SFA. There was a short segment calcified CTO distal less than SFA and above-knee popliteal artery reconstituting with two-vessel runoff. 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 It was felt that she had bilateral SFA disease as well as tibial vessel disease. An attempt was made to revascularize the left SFA but they were unable to cross because of the highly calcified nature of the lesion. 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 She has an area on the medial left fourth toe with raised horned callus on top of this. I am not certain what this represents. With regards to pain she has about a 2-1/2 I will claudication tolerance in the grocery store. She has some pain in night which is relieved by putting her feet down over the side of the bed. She is wearing Nitro-Dur patches on the top of her right foot. Past medical history includes type 2 diabetes with secondary PAD, neoplasm of the skin, diabetic retinopathy, carotid  artery stenosis, hyperlipidemia hypertension. 11/2; the last time the patient was here I spoke to Dr. Gwenlyn Found about revascularization options on the right. As I understand things currently Dr. Gwenlyn Found spoke with Dr. Trula Slade and ultimately the patient was taken to surgery on 10/27. She had a right iliofemoral endarterectomy with a bovine patch angioplasty. I think the plan now is for her to have a staged intervention on the right SFA by Dr. Alvester Chou. Per the patient's understanding Dr. Gwenlyn Found and Dr. Jacqualyn Posey are waiting to see when the gangrenous toes on the right foot can be amputated. The patient states her pain is a lot better and she is grateful for that. 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 Small area on the left fourth toe. She is using Betadine to the mummified areas on the right and Medihoney on the left 08/27/2019; the last time I saw this patient I discharged her from the clinic. She had been revascularized by Dr. Trula Slade and she had a right iliofemoral endarterectomy with a bovine angioplasty. 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 Jacqualyn Posey of podiatry on 08/07/2019. I note that she also had a intervention by Dr. Gwenlyn Found and he performed a directional atherectomy and drug-coated balloon angioplasty of the SFA and popliteal artery on the right. I am not certain of the date of Dr. Kennon Holter procedure as of the time of this dictation. She was referred back to [place] by Dr. Earleen Newport predominantly for follow-up of the left fourth toe. She still has sutures and stitches in the right TMA site. She states her pain is a lot better. She expresses concern about the condition of the amputation site at the TMA. She is on doxycycline I think prescribed by Dr. Earleen Newport. She is complaining of some pain at night 12/10; I spoke to Dr. Jacqualyn Posey last week. He removed the sutures on the right foot on Monday of this week. She has the area on  the left  fourth toe just proximal to the PIP and then the right TMA site. She is still on doxycycline and has enough through next week. Unfortunately the TMA site does not look good at all. Both on the lateral and medial part of the incisions are areas that probe to bone. There is purulence over the medial part which I have cultured. We will use silver alginate. Left fourth toe looks somewhat better but there was still exposed bone 12/17; patient has an MRI booked for 12/30. Culture I did last week showed Pseudomonas Serratia and Enterococcus. This was purulent drainage coming out of the medial part of her amputation site. I use cefdinir 300 twice daily for 10 days that started on 12/15. Her x-ray on the right showed limited evaluation for osteomyelitis. The findings could have been postoperative. There was subtle erosion in the distal first and distal fifth metatarsal. An MRI was suggested. On the left she had irregularity of the fourth middle and proximal phalanx consistent with a history of osteomyelitis. I do not know that she has a history of osteomyelitis in this area. She had a newly defined area on the plantar third toe 12/31; patient's MRI is listed below: MRI OF THE RIGHT FOREFOOT WITHOUT AND WITH CONTRAST TECHNIQUE: Multiplanar, multisequence MR imaging of the right forefoot was performed before and after the administration of intravenous contrast. CONTRAST 6 mL GADAVIST IV SOLN : COMPARISON: Plain films right foot 09/04/2019. FINDINGS: Bones/Joint/Cartilage The patient is status post transmetatarsal amputation as seen on the prior exam. Marrow edema and enhancement are seen in all of the distal metatarsals. In the first metatarsal, signal change extends 1.5 cm proximal to the stump and in the second metatarsal extends approximately 2 cm proximal to the stump. Edema and enhancement are seen in only the distal 0.7 cm of the third metatarsal stump and tips of the fourth and fifth  metatarsals. Bone marrow signal is otherwise unremarkable. A small focus of subchondral edema is seen in the lateral talus, likely degenerative. Ligaments Intact. Muscles and Tendons No intramuscular fluid collection. Soft tissues Skin ulceration is seen off the stump of the first metatarsal. A thin fluid collection tracks deep to the wound and over the anterior metatarsals worrisome for small abscess. Intense subcutaneous edema and enhancement are seen diffusely. 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 Cellulitis about the foot. Skin ulceration over the distal first metatarsal is identified with a thin fluid collection tracking anteriorly along the stump worrisome for abscess. Small focus of subchondral edema in the lateral talus is likely degenerative. Electronically Signed By: Inge Rise M.D. On: 09/23/2019 15:25 Patient arrives in clinic today not complaining any of any pain. 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 She does not describe claudication however her activity is very limited. 10/01/2019; since the patient was last here I have communicated with Dr. Gwenlyn Found after bypass by Dr. Trula Slade and addressing the right superficial femoral artery he states that she is widely patent through peroneal arteries to the ankle with collaterals to the dorsalis pedis. He states he is going to talk to colleagues about the feasibility of tibial pedal access. The patient seems infectious disease later this afternoon Dr. Baxter Flattery. in preparation for this she has been off antibiotics for 1 week and I went ahead and obtain pieces of the remanence of her first  metatarsal for pathology and CandS. The patient is a candidate for hyperbaric oxygen. 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 1/15; considerable improvement in the wounds on her feet. We are using silver collagen. She follows with Dr. Baxter Flattery of infectious disease and is on meropenem and daptomycin. She has been taught how to do this herself at home. Follows with Dr. Graylon Good at the end of treatment here. She has 2 wounds on the surgical TMA site 1 lateral and 1 medial the lateral 1 has regressed tremendously. The area medially still has some exposed bone although the base of this looks healthy. 1/22; 2 separate wounds on the surgical TMA site. Both of these look satisfactory. The medial area does not have any exposed bone. This is an improvement On the left side fourth toe dorsally over the proximal phalanx there is a deep punched out area that probes to bone. She has an area on the tip of the left third toe. She also tells [place] that in HBO she traumatized her left Achilles and this is left her with a superficial wound area 1/28; weekly visit along with HBO. She has 5 wounds. T our punched out areas on the original TMA site on the right. Both of these appear to have contracted. o The area on the right no longer has exposed bone. She has an area on the tip of the left third toe and on the DIP of the left fourth toe. Both of these had surface callus that I removed and unfortunately they have small areas that both go to bone. She has a traumatic wound on the left Achilles area that happened in HBO and that appears better. She is tolerating her IV antibiotics well at home. She has home health changing the dressings and she is doing it once on the weekends. We have been using silver collagen. She has been extensively revascularized on the right by Dr. Trula Slade and subsequently by Dr. Gwenlyn Found. 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 Baxter Flattery of  infectious disease on 2/17. Dr. Gwenlyn Found on 2/23 and according to her her antibiotics stopped on 2/23. She is tolerating hyperbarics well. She has made a tremendous improvement in the right forefoot with only 2 smaller open wounds. Using silver collagen. On the left foot she has the area on the tip of the left third toe and the medial part of the left fourth toe. These had exposed bone last week I did not sense any of that today 2/12; sees Dr. Graylon Good on 2/17 and Dr. Gwenlyn Found on 2/23. We are going to use Dermagraft on this today however the lateral part of her train TMA incision on the right is healed and the medial part is down so much that we just continue with the silver collagen She has wounds on the tip of her left third and the medial aspect of the left fourth. Both of these still have exposed bone I have not been able to get these to epithelialize. 2/19; she sees Dr. Gwenlyn Found on 2/23 and I have communicated with him about the left vascular supply. Looking at her angiogram from 08/03/2019 it looks as though that they could not cross the left CFA. Noted that she had one-vessel runoff bilaterally. She also apparently saw Dr. Graylon Good although I did not look up these  results for preparation of this record. 2/26; Dr. Gwenlyn Found is going to do an angiogram next week on Wednesday. I think they are going to try to go at this both retrograde and anterograde to see if anything can be done to revascularize the left lower limb. She continues to make nice progress on the remaining wound on the right medial foot on her TMA site and the toe it wounds on the left are responding nicely as well 3/12; the patient underwent an extensive and complicated revascularization on the left leg by Dr. Fletcher Anon on 3/3; she had an atherectomy on occluded left popliteal artery; anterior tibial artery followed by a balloon angioplasty. Atherectomy was also performed to the left SFA because there was still significant 50% stenosis in the left  popliteal artery they performed an intravascular lithotripsy which improved the residual stenosis to 20%. The same lithotripsy was used to dilate the proximal left SFA. She had a drug-eluting stent placed I believe in the SFA. The patient returned for hyperbarics this week. She has had some eye problems on the right which she tells me are secondary to diabetic retinopathy and she saw her eye doctor. She is really made excellent progress there is no open wounds on the left foot at all. She has one open area medially and her TMA site on the right lateral wound is closed. 3/19; the patient comes in today with the area on the medial transmet on the right improved. Some debris removed from the surface revealed the still open wound. Unfortunately she now has the open area on the third toe tip and the medial aspect of the dorsal fourth toe. These are in the same location as her previous wounds. These had actually closed up last week. 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 She is undergone complex revascularizations on the right by Dr. Trula Slade and Dr. Gwenlyn Found and I believe Dr. 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 She completed IVantibiotics as directed by infectious disease and I believe a course of oral antibiotics as well.she underwent a complete 40 treatments of hyperbarics. She comes in today with the vast majority of the right TMA site healed there is a small opening on the most medial aspect. Her left third and left fourth toes appear to have epithelialization although this has fluctuated somewhat. 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 She had a gaping wound on the right transmetatarsal  amputation L as well as deep wounds on the left third and left fourth toes. She underwent complex revascularizations on her bilateral lower legs which I described on her last visit. She completed IV antibiotics by infectious disease and 40 treatments of hyperbarics. Her left third and fourth toes are healed. The area on the right transmetatarsal amputation site is also closed today. READMISSION 08/04/2020 Mrs. [name] is now a 80 year old woman that we had for a complex stay in our clinic from October 2000 14 January 2020. 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 Her current problems with wounds began on October 30 when she developed  a spontaneous opening over the right transmet medial aspect over the first metatarsal head. She has been using collagen on this that she had leftover from her last stay in this clinic. She also has had an open area on the medial right calf from a vein harvest site from her CABG she. She said that this is never really closed since the vein harvest was done. With regards to her arterial status this is a major issue. She had an angiogram by Dr. Gwenlyn Found in September 2020. She developed a gangrenous right first and second toes. Ultimately she was referred to Dr. Trula Slade who did a right common femoral endarterectomy with patch angioplasty. This was on 07/21/2019 and she really had a good result. Dr. Gwenlyn Found did a atherectomy followed by a drug coated balloon angioplasty of her right SFA popliteal and tibial peroneal trunk on 08/03/2019 again with excellent excellent results. Follow-up.Dopplers in November revealed revealed a widely patent SFA and tibioperoneal trunk. UNFORTUNATELY her recent angiogram that was done on 08/01/2020 showed an 80% right common femoral artery stenosis just above the previous endarterectomy site occluded right right  SFA at the origin reconstituting the adductor canal by the profunda femoris collaterals. The profunda femoris is also diffusely diseased. There is and again another 80% tibioperoneal trunk stenosis and one-vessel runoff via the peroneal artery. She is seen Dr. Donzetta Matters and she is scheduled for a femoral endarterectomy and right femoral-popliteal bypass grafting with endarterectomy of her tibial peroneal trunk. The scheduled surgery is on 11/23 The patient does not complain of pain at rest. She does have 5-minute walking claudication. The wounds on her right first met head remanent was spontaneous she does not think she hit this. As mentioned in the area on her right medial calf is a vein harvest site Dr. Gwenlyn Found had texted me about getting her in the clinic and we have arranged this as soon as we can. I have told her that we will have to see how successful Dr. Donzetta Matters is before we know what can be done about the wounds in a precise fashion. Fortunately there does not seem to be any obvious infection involving the right foot although that may need to be looked at if things really deteriorate. She was treated with IV antibiotics and hyperbaric oxygen for her previous osteomyelitis 12/2; the patient underwent a right femoral endarterectomy and bypass femoral to popliteal artery. This was done on 08/16/2020. She is done remarkably well. She has a small area on the right anterior tibia and a small area in the medial part of her original right transmetatarsal amputation. She appears to have had an excellent response to surgery. 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 Snooks is a now 80 year old woman who arrives for review of wounds on the left TMA site extending into her plantar foot as well as an area on the left posterior calcaneus. We had her extensively in 2021 for a failed  right TMA in the setting of type 2 diabetes with angiopathy and underlying osteomyelitis. She was extensively revascularized on the right at that time as noted above in our previous notes ended up receiving IV antibiotics and hyperbaric oxygen therapy. Miraculously this TMA site actually closed and she really has had not any trouble with this since. UNFORTUNATELY the same cannot be said of her left side. Her problem apparently started sometime in July when she  hit her left third toe on a barstool while playing pool. She developed an acute infection which eventually led to underlying osteomyelitis. She underwent an extensive hospitalization from 04/27/2021 through 05/15/2021. She received IV antibiotics. Also extensive intervention by Dr. Stanford Breed of vein and vascular which included a left common femoral to tibioperoneal trunk bypass as well as a left third toe amputation on 05/03/2021. Unfortunately the area did not heal she ended up requiring a left TMA. By her description this was left open. She has been using a wound VAC on this to earlier this week and then was switched to Santyl. I think because of the left forefoot off loader she is also developed an area on the left posterior calcaneus. She is not complaining of a lot of pain. She has her daughter at home to change the dressings. She has been prescribed Santyl and she has a tube that she is brought into our clinic. I cannot get a lot of history of claudication although she is really not that mobile at this point walking with a walker. 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 She has had multiple interventions on the right side by vein and vascular and also by Dr. Gwenlyn Found as well as a transmetatarsal amputation on the right. She has had follow-up noninvasive studies on 06/06/2021; on the left that showed an ABI of  0.47 with monophasic waveforms a dorsalis pedis ABI of 0.40 with monophasic waveforms. Her ANGIOGRAM which was initially done by Dr. Oneida Alar on 04/28/2021 showed severe left foot superficial femoral artery occlusive disease and an occlusion of the left popliteal artery with two-vessel runoff to the left foot with moderate to severe disease below the knee popliteal artery and tibial disease. She had 80% distal right external iliac artery above the existing femoral above-knee popliteal bypass but she does not have an open wound on the right foot She arrives in clinic with an extensive open area on the TMA site which in the mid aspect extends into the plantar foot. She also has a more superficial area on the upper part of her Achilles area of the left heel 9/29; patient arrives in clinic today with slough over the TMA site. We use MolecuLight to look at the surface of this suggesting cyan and white discoloration over a large part of this wound also extending into the third metatarsal area. She also has the area on the left posterior calcaneus. This is slough covered we will switch to [facility] here as well. There is some warmth in the area and some erythema we will give her antibiotics as well 10/6; completely necrotic surface once again on the left TMA site. We have been using Santyl and Hydrofera Blue. She has an area on the left posterior Achilles and actually states this has been more painful this week indeed there is some erythema around this area. She also describes I think claudication at rest which is relieved by dipping her left leg over the side of the bed at night. Electronic Signature(s) Signed: 06/29/2021 5:45:05 PM By: [name] MD Entered By: [name] on 06/29/2021 10:45:26 -------------------------------------------------------------------------------- Physical Exam Details Patient Name: Date of Service: [name], [name]. 06/29/2021 9:45 A M Medical Record Number:  [id number] Patient Account Number: [id number] Date of Birth/Sex: Treating RN: [birth year] (80 y.o. [name] Primary Care Provider: [name] Other Clinician: Referring Provider:  Treating Provider/[name]: Tressie Stalker in Treatment: 2 Constitutional Patient is hypotensive. However she looks well. Pulse regular and within target range for patient.Marland Kitchen Respirations regular, non-labored and within target range.. Temperature is normal and within the target range for the patient.Marland Kitchen Appears in no distress. Cardiovascular Markedly reduced pedal pulses although they are faintly palpable at both the dorsalis pedis and posterior tibial. Notes Wound exam;'s is substantial wound over the left TMA site probing down to the plantar foot. Completely covered in 100% very adherent necrotic material aggressively debrided. I am able to get most of this off except on the plantar part of the wound. I did not repeat MolecuLight. The posterior heel wound on the left again has a nonviable surface but most remarkable today for very surrounding erythema Electronic Signature(s) Signed: 06/29/2021 5:45:05 PM By: [name] MD Entered By: [name] on 06/29/2021 10:47:24 -------------------------------------------------------------------------------- Physician Orders Details Patient Name: Date of Service: [name], [name]. 06/29/2021 9:45 A M Medical Record Number: [id number] Patient Account Number: [id number] Date of Birth/Sex: Treating RN: 1941/03/12 (80 y.o. 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 - with Dr. Dellia Nims Return A Bathing/ Shower/ Hygiene May shower and wash wound with soap and water. Off-Loading Open toe surgical shoe to: - right foot Wedge shoe to: - left foot Home Health No change in wound care orders this week; continue Home Health for wound care. May utilize formulary equivalent dressing for wound treatment orders unless otherwise specified. Other Home Health Orders/Instructions: - [facility] 3x a week Wound Treatment Wound #13 - Amputation Site - Transmetatarsal Wound Laterality: Left Cleanser: Soap and Water 1 x Per Day/7 Days Discharge Instructions: May shower and wash wound with dial antibacterial soap and water prior to dressing change. Cleanser: Wound Cleanser (Home Health) 1 x Per Day/7 Days Discharge Instructions: Cleanse the wound with wound cleanser or normal saline prior to applying a clean dressing using gauze sponges, not tissue or cotton balls. Prim Dressing: KerraCel Ag Gelling Fiber Dressing, 4x5 in (silver alginate) (Home Health) 1 x Per Day/7 Days ary Discharge Instructions: Apply silver alginate over the santyl. Prim Dressing: Santyl Ointment 1 x Per Day/7 Days ary Discharge Instructions: Apply nickel thick amount to wound bed as instructed Secondary Dressing: Woven Gauze Sponge, Non-Sterile 4x4 in (Home Health) 1 x Per Day/7 Days Discharge Instructions: Apply over primary dressing as directed. Secondary Dressing: ABD Pad, 5x9 (Home Health) 1 x Per Day/7 Days Discharge Instructions: Apply over primary dressing as directed. Secured With: [employer], 4.5x3.1 (in/yd) (Home Health) 1 x Per Day/7 Days Discharge Instructions:  Secure with Kerlix as directed. Secured With: Paper Tape, 2x10 (in/yd) (Home Health) 1 x Per Day/7 Days Discharge Instructions: Secure dressing with tape as directed. Wound #14 - Calcaneus Wound Laterality: Left, Posterior Cleanser: Soap and Water 1 x Per Day/7 Days Discharge Instructions: May shower and wash wound with dial antibacterial soap and water prior to dressing change. Cleanser: Wound Cleanser (Home Health)  1 x Per Day/7 Days Discharge Instructions: Cleanse the wound with wound cleanser or normal saline prior to applying a clean dressing using gauze sponges, not tissue or cotton balls. Prim Dressing: KerraCel Ag Gelling Fiber Dressing, 4x5 in (silver alginate) (Home Health) 1 x Per Day/7 Days ary Discharge Instructions: Apply silver alginate over santyl. Prim Dressing: Santyl Ointment 1 x Per Day/7 Days ary Discharge Instructions: Apply nickel thick amount to wound bed as instructed Secondary Dressing: Woven Gauze Sponge, Non-Sterile 4x4 in (Home Health) 1 x Per Day/7 Days Discharge Instructions: Apply over primary dressing as directed. Secondary Dressing: ALLEVYN Heel 4 1/2in x 5 1/2in / 10.5cm x 13.5cm (Home Health) 1 x Per Day/7 Days Discharge Instructions: Apply over primary dressing as directed. Secured With: [employer], 4.5x3.1 (in/yd) (Home Health) 1 x Per Day/7 Days Discharge Instructions: Secure with Kerlix as directed. Secured With: Paper Tape, 2x10 (in/yd) (Home Health) 1 x Per Day/7 Days Discharge Instructions: Secure dressing with tape as directed. Patient Medications llergies: Demerol, scopolamine, Bactrim, Versed, tramadol A Notifications Medication Indication Start End cellulitis left heel 06/29/2021 doxycycline monohydrate DOSE oral 100 mg capsule - 1 capsule oral bid for 7 days Electronic Signature(s) Signed: 06/29/2021 10:42:43 AM By: [name] MD Entered By: [name] on 06/29/2021  10:42:42 -------------------------------------------------------------------------------- Problem List Details Patient Name: Date of Service: [name], [name]. 06/29/2021 9:45 A M Medical Record Number: [id number] Patient Account Number: [id number] Date of Birth/Sex: Treating RN: [birth year] (80 y.o. [name], [name] Primary Care Provider: [name] Other Clinician: Referring Provider: Treating Provider/[name]: Tressie Stalker in Treatment: 2 Active Problems ICD-10 Encounter Code Description Active Date MDM Diagnosis E11.621 Type 2 diabetes mellitus with foot ulcer 06/15/2021 No Yes T81.31XD Disruption of external operation (surgical) wound, not elsewhere classified, 06/15/2021 No Yes subsequent encounter E11.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 06/15/2021 No Yes L97.528 Non-pressure chronic ulcer of other part of left foot with other specified 06/15/2021 No Yes severity L97.428 Non-pressure chronic ulcer of left heel and midfoot with other specified 06/15/2021 No Yes severity Inactive Problems Resolved Problems Electronic Signature(s) Signed: 06/29/2021 5:45:05 PM By: [name] MD Entered By: [name] on 06/29/2021 10:43:11 -------------------------------------------------------------------------------- Progress Note Details Patient Name: Date of Service: [name], [name]. 06/29/2021 9:45 A M Medical Record Number: [id number] Patient Account Number: [id number] Date of Birth/Sex: Treating RN: [birth year] (80 y.o. [name] Primary Care Provider: [name] Other Clinician: Referring Provider: Treating Provider/[name]: Tressie Stalker in Treatment: 2 Subjective History of Present Illness (HPI) ADMISSION 07/13/2019 Patient is a 80 year old type II diabetic. She has known PAD. She has been followed by Dr. Jacqualyn Posey of podiatry for blistering areas  on her toes dating back to 04/30/2019 which at this was the left fourth toe. By 8/11 she had wounds on the right and left second toes. She underwent arterial studies by Dr. Alvester Chou on 7/31 that showed ABIs on the right of 0.60 TBI of 0.26 on the left ABI of 0.56 and a TBI of 0.25. By 9/10 she had ischemic-looking wounds per Dr. Earleen Newport on the right first, left first second and third. She has been using Medihoney and then mupirocin more recently simply Betadine. The patient underwent angiography by Dr. Gwenlyn Found on 9/21. 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 Noted that there was 1 vessel  runoff in the foot via the peroneal. ooOn the left there was an 80% left SFA, 70% mid left SFA. There was a short segment calcified CTO distal less than SFA and above-knee popliteal artery reconstituting with two-vessel runoff. 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 It was felt that she had bilateral SFA disease as well as tibial vessel disease. An attempt was made to revascularize the left SFA but they were unable to cross because of the highly calcified nature of the lesion. 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 She has an area on the medial left fourth toe with raised horned callus on top of this. I am not certain what this represents. With regards to pain she has about a 2-1/2 I will claudication tolerance in the grocery store. She has some pain in night which is relieved by putting her feet down over the side of the bed. She is wearing Nitro-Dur patches on the top of her right foot. Past medical history includes type 2 diabetes with secondary PAD, neoplasm of the skin, diabetic retinopathy, carotid artery stenosis, hyperlipidemia hypertension. 11/2; the last time the  patient was here I spoke to Dr. Gwenlyn Found about revascularization options on the right. As I understand things currently Dr. Gwenlyn Found spoke with Dr. Trula Slade and ultimately the patient was taken to surgery on 10/27. She had a right iliofemoral endarterectomy with a bovine patch angioplasty. I think the plan now is for her to have a staged intervention on the right SFA by Dr. Alvester Chou. Per the patient's understanding Dr. Gwenlyn Found and Dr. Jacqualyn Posey are waiting to see when the gangrenous toes on the right foot can be amputated. The patient states her pain is a lot better and she is grateful for that. 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 Small area on the left fourth toe. She is using Betadine to the mummified areas on the right and Medihoney on the left 08/27/2019; the last time I saw this patient I discharged her from the clinic. She had been revascularized by Dr. Trula Slade and she had a right iliofemoral endarterectomy with a bovine angioplasty. 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 Jacqualyn Posey of podiatry on 08/07/2019. I note that she also had a intervention by Dr. Gwenlyn Found and he performed a directional atherectomy and drug-coated balloon angioplasty of the SFA and popliteal artery on the right. I am not certain of the date of Dr. Kennon Holter procedure as of the time of this dictation. She was referred back to [place] by Dr. Earleen Newport predominantly for follow-up of the left fourth toe. She still has sutures and stitches in the right TMA site. She states her pain is a lot better. She expresses concern about the condition of the amputation site at the TMA. She is on doxycycline I think prescribed by Dr. Earleen Newport. She is complaining of some pain at night 12/10; I spoke to Dr. Jacqualyn Posey last week. He removed the sutures on the right foot on Monday of this week. She has the area on the left fourth toe just proximal to the PIP and then the right TMA site.  She is still on doxycycline and has enough through next week. Unfortunately the TMA site does not look good at all. Both on the lateral and medial part of the incisions are areas that  probe to bone. There is purulence over the medial part which I have cultured. We will use silver alginate. Left fourth toe looks somewhat better but there was still exposed bone 12/17; patient has an MRI booked for 12/30. Culture I did last week showed Pseudomonas Serratia and Enterococcus. This was purulent drainage coming out of the medial part of her amputation site. I use cefdinir 300 twice daily for 10 days that started on 12/15. Her x-ray on the right showed limited evaluation for osteomyelitis. The findings could have been postoperative. There was subtle erosion in the distal first and distal fifth metatarsal. An MRI was suggested. On the left she had irregularity of the fourth middle and proximal phalanx consistent with a history of osteomyelitis. I do not know that she has a history of osteomyelitis in this area. She had a newly defined area on the plantar third toe 12/31; patient's MRI is listed below: MRI OF THE RIGHT FOREFOOT WITHOUT AND WITH CONTRAST TECHNIQUE: Multiplanar, multisequence MR imaging of the right forefoot was performed before and after the administration of intravenous contrast. CONTRAST 6 mL GADAVIST IV SOLN : COMPARISON: Plain films right foot 09/04/2019. FINDINGS: Bones/Joint/Cartilage The patient is status post transmetatarsal amputation as seen on the prior exam. Marrow edema and enhancement are seen in all of the distal metatarsals. In the first metatarsal, signal change extends 1.5 cm proximal to the stump and in the second metatarsal extends approximately 2 cm proximal to the stump. Edema and enhancement are seen in only the distal 0.7 cm of the third metatarsal stump and tips of the fourth and fifth metatarsals. Bone marrow signal is otherwise unremarkable. A small focus of  subchondral edema is seen in the lateral talus, likely degenerative. Ligaments Intact. Muscles and Tendons No intramuscular fluid collection. Soft tissues Skin ulceration is seen off the stump of the first metatarsal. A thin fluid collection tracks deep to the wound and over the anterior metatarsals worrisome for small abscess. Intense subcutaneous edema and enhancement are seen diffusely. 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 Cellulitis about the foot. Skin ulceration over the distal first metatarsal is identified with a thin fluid collection tracking anteriorly along the stump worrisome for abscess. Small focus of subchondral edema in the lateral talus is likely degenerative. Electronically Signed By: Inge Rise M.D. On: 09/23/2019 15:25 Patient arrives in clinic today not complaining any of any pain. 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 She does not describe claudication however her activity is very limited. 10/01/2019; since the patient was last here I have communicated with Dr. Gwenlyn Found after bypass by Dr. Trula Slade and addressing the right superficial femoral artery he states that she is widely patent through peroneal arteries to the ankle with collaterals to the dorsalis pedis. He states he is going to talk to colleagues about the feasibility of tibial pedal access. The patient seems infectious disease later this afternoon Dr. Baxter Flattery. in preparation for this she has been off antibiotics for 1 week and I went ahead and obtain pieces of the remanence of her first metatarsal for pathology and CandS. The patient is a candidate for hyperbaric oxygen. 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 1/15; considerable  improvement in the wounds on her feet. We are using silver collagen. She follows with Dr. Baxter Flattery  of infectious disease and is on meropenem and daptomycin. She has been taught how to do this herself at home. Follows with Dr. Graylon Good at the end of treatment here. She has 2 wounds on the surgical TMA site 1 lateral and 1 medial the lateral 1 has regressed tremendously. The area medially still has some exposed bone although the base of this looks healthy. 1/22; 2 separate wounds on the surgical TMA site. Both of these look satisfactory. The medial area does not have any exposed bone. This is an improvement On the left side fourth toe dorsally over the proximal phalanx there is a deep punched out area that probes to bone. She has an area on the tip of the left third toe. She also tells [place] that in HBO she traumatized her left Achilles and this is left her with a superficial wound area 1/28; weekly visit along with HBO. She has 5 wounds. T our punched out areas on the original TMA site on the right. Both of these appear to have contracted. o The area on the right no longer has exposed bone. She has an area on the tip of the left third toe and on the DIP of the left fourth toe. Both of these had surface callus that I removed and unfortunately they have small areas that both go to bone. She has a traumatic wound on the left Achilles area that happened in HBO and that appears better. She is tolerating her IV antibiotics well at home. She has home health changing the dressings and she is doing it once on the weekends. We have been using silver collagen. She has been extensively revascularized on the right by Dr. Trula Slade and subsequently by Dr. Gwenlyn Found. 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 Baxter Flattery of infectious disease on 2/17. Dr. Gwenlyn Found on 2/23 and according to her her  antibiotics stopped on 2/23. She is tolerating hyperbarics well. She has made a tremendous improvement in the right forefoot with only 2 smaller open wounds. Using silver collagen. On the left foot she has the area on the tip of the left third toe and the medial part of the left fourth toe. These had exposed bone last week I did not sense any of that today 2/12; sees Dr. Graylon Good on 2/17 and Dr. Gwenlyn Found on 2/23. We are going to use Dermagraft on this today however the lateral part of her train TMA incision on the right is healed and the medial part is down so much that we just continue with the silver collagen She has wounds on the tip of her left third and the medial aspect of the left fourth. Both of these still have exposed bone I have not been able to get these to epithelialize. 2/19; she sees Dr. Gwenlyn Found on 2/23 and I have communicated with him about the left vascular supply. Looking at her angiogram from 08/03/2019 it looks as though that they could not cross the left CFA. Noted that she had one-vessel runoff bilaterally. She also apparently saw Dr. Graylon Good although I did not look up these results for preparation of this record. 2/26; Dr. Gwenlyn Found is going to do an angiogram next week on Wednesday. I think they are going to try to go at this both retrograde and anterograde to see if anything can be done to revascularize the left lower limb.  She continues to make nice progress on the remaining wound on the right medial foot on her TMA site and the toe it wounds on the left are responding nicely as well 3/12; the patient underwent an extensive and complicated revascularization on the left leg by Dr. Fletcher Anon on 3/3; she had an atherectomy on occluded left popliteal artery; anterior tibial artery followed by a balloon angioplasty. Atherectomy was also performed to the left SFA because there was still significant 50% stenosis in the left popliteal artery they performed an intravascular lithotripsy which improved  the residual stenosis to 20%. The same lithotripsy was used to dilate the proximal left SFA. She had a drug-eluting stent placed I believe in the SFA. The patient returned for hyperbarics this week. She has had some eye problems on the right which she tells me are secondary to diabetic retinopathy and she saw her eye doctor. She is really made excellent progress there is no open wounds on the left foot at all. She has one open area medially and her TMA site on the right lateral wound is closed. 3/19; the patient comes in today with the area on the medial transmet on the right improved. Some debris removed from the surface revealed the still open wound. ooUnfortunately she now has the open area on the third toe tip and the medial aspect of the dorsal fourth toe. These are in the same location as her previous wounds. These had actually closed up last week. 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 She is undergone complex revascularizations on the right by Dr. Trula Slade and Dr. Gwenlyn Found and I believe Dr. 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 She completed IVantibiotics as directed by infectious disease and I believe a course of oral antibiotics as well.she underwent a complete 40 treatments of hyperbarics. She comes in today with the vast majority of the right TMA site healed there is a small opening on the most medial aspect. Her left third and left fourth toes appear to have epithelialization although this has fluctuated somewhat. 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 She had a gaping wound on the right transmetatarsal amputation L as well as deep wounds on the left third and left fourth toes.  She underwent complex revascularizations on her bilateral lower legs which I described on her last visit. She completed IV antibiotics by infectious disease and 40 treatments of hyperbarics. Her left third and fourth toes are healed. The area on the right transmetatarsal amputation site is also closed today. READMISSION 08/04/2020 Mrs. [name] is now a 80 year old woman that we had for a complex stay in our clinic from October 2000 14 January 2020. 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 Her current problems with wounds began on October 30 when she developed a spontaneous opening over the right transmet medial aspect over the first metatarsal head. She has been using collagen on this that she had leftover from her last stay in this clinic. She also has had an open area on the medial right calf from a vein  harvest site from her CABG she. She said that this is never really closed since the vein harvest was done. With regards to her arterial status this is a major issue. She had an angiogram by Dr. Gwenlyn Found in September 2020. She developed a gangrenous right first and second toes. Ultimately she was referred to Dr. Trula Slade who did a right common femoral endarterectomy with patch angioplasty. This was on 07/21/2019 and she really had a good result. Dr. Gwenlyn Found did a atherectomy followed by a drug coated balloon angioplasty of her right SFA popliteal and tibial peroneal trunk on 08/03/2019 again with excellent excellent results. Follow-up.Dopplers in November revealed revealed a widely patent SFA and tibioperoneal trunk. 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 The profunda femoris is also diffusely diseased. There is and again another 80% tibioperoneal trunk stenosis and one-vessel runoff via the peroneal artery. She is seen Dr. Donzetta Matters and she is scheduled for a femoral endarterectomy and right femoral-popliteal bypass grafting with endarterectomy of her tibial peroneal trunk. The scheduled surgery is on 11/23 The patient does not complain of pain at rest. She does have 5-minute walking claudication. The wounds on her right first met head remanent was spontaneous she does not think she hit this. As mentioned in the area on her right medial calf is a vein harvest site Dr. Gwenlyn Found had texted me about getting her in the clinic and we have arranged this as soon as we can. I have told her that we will have to see how successful Dr. Donzetta Matters is before we know what can be done about the wounds in a precise fashion. Fortunately there does not seem to be any obvious infection involving the right foot although that may need to be looked at if things really deteriorate. She was treated with IV antibiotics and hyperbaric oxygen for her previous osteomyelitis 12/2; the patient underwent a right femoral endarterectomy and bypass femoral to popliteal artery. This was done on 08/16/2020. She is done remarkably well. She has a small area on the right anterior tibia and a small area in the medial part of her original right transmetatarsal amputation. She appears to have had an excellent response to surgery. 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 Restivo is a now 80 year old woman who arrives for review of wounds on the left TMA site extending into her plantar foot as well as an area on the left posterior calcaneus. 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 She was extensively revascularized on the right at that time as noted above in our previous notes ended up receiving IV antibiotics and hyperbaric oxygen therapy. Miraculously this TMA site actually closed and she really has had not any trouble with this since. UNFORTUNATELY the same cannot be said of her left side. Her problem apparently started sometime in July when she hit her left third toe on a barstool while playing pool. She developed an acute infection which eventually led to underlying osteomyelitis. She underwent an extensive hospitalization from 04/27/2021 through 05/15/2021. She received IV antibiotics. Also extensive intervention by Dr. Stanford Breed of vein and vascular which included  a left common femoral to tibioperoneal trunk bypass as well as a left third toe amputation on 05/03/2021. Unfortunately the area did not heal she ended up requiring a left TMA. By her description this was left open. She has been using a wound VAC on this to earlier this week and then was switched to Santyl. I think because of the left forefoot off loader she is also developed an area on the left posterior calcaneus. She is not complaining of a lot of pain. She has her daughter at home to change the dressings. She has been prescribed Santyl and she has a tube that she is brought into our clinic. I cannot get a lot of history of claudication although she is really not that mobile at this point walking with a walker. 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 She has had multiple interventions on the right side by vein and vascular and also by Dr. Gwenlyn Found as well as a transmetatarsal amputation on the right. She has had follow-up noninvasive studies on 06/06/2021; on the left that showed an ABI of 0.47 with monophasic waveforms a dorsalis pedis ABI of 0.40  with monophasic waveforms. Her ANGIOGRAM which was initially done by Dr. Oneida Alar on 04/28/2021 showed severe left foot superficial femoral artery occlusive disease and an occlusion of the left popliteal artery with two-vessel runoff to the left foot with moderate to severe disease below the knee popliteal artery and tibial disease. She had 80% distal right external iliac artery above the existing femoral above-knee popliteal bypass but she does not have an open wound on the right foot She arrives in clinic with an extensive open area on the TMA site which in the mid aspect extends into the plantar foot. She also has a more superficial area on the upper part of her Achilles area of the left heel 9/29; patient arrives in clinic today with slough over the TMA site. We use MolecuLight to look at the surface of this suggesting cyan and white discoloration over a large part of this wound also extending into the third metatarsal area. She also has the area on the left posterior calcaneus. This is slough covered we will switch to [facility] here as well. There is some warmth in the area and some erythema we will give her antibiotics as well 10/6; completely necrotic surface once again on the left TMA site. We have been using Santyl and Hydrofera Blue. She has an area on the left posterior Achilles and actually states this has been more painful this week indeed there is some erythema around this area. She also describes I think claudication at rest which is relieved by dipping her left leg over the side of the bed at night. Objective Constitutional Patient is hypotensive. However she looks well. Pulse regular and within target range for patient.Marland Kitchen Respirations regular, non-labored and within target range.. Temperature is normal and within the target range for the patient.Marland Kitchen Appears in no distress. Vitals Time Taken: 9:40 AM, Height: 64 in, Weight: 135 lbs, BMI: 23.2, Temperature: 97.8 F, Pulse: 81 bpm, Respiratory  Rate: 16 breaths/min, Blood Pressure: 97/38 mmHg, Capillary Blood Glucose: 88 mg/dl. General Notes: glucose per pt report this am Cardiovascular Markedly reduced pedal pulses although they are faintly palpable at both the dorsalis pedis and posterior tibial. General Notes: Wound exam;'s is substantial wound over the left TMA site probing  down to the plantar foot. Completely covered in 100% very adherent necrotic material aggressively debrided. I am able to get most of this off except on the plantar part of the wound. I did not repeat MolecuLight. oo The posterior heel wound on the left again has a nonviable surface but most remarkable today for very surrounding erythema Integumentary (Hair, Skin) Wound #13 status is Open. Original cause of wound was Surgical Injury. The date acquired was: 05/11/2021. The wound has been in treatment 2 weeks. The wound is located on the Left Amputation Site - Transmetatarsal. The wound measures 5.5cm length x 7.4cm width x 0.6cm depth; 31.966cm^2 area and 19.179cm^3 volume. There is Fat Layer (Subcutaneous Tissue) exposed. There is no tunneling or undermining noted. There is a large amount of purulent drainage noted. The wound margin is flat and intact. There is medium (34-66%) red, pink granulation within the wound bed. There is a medium (34-66%) amount of necrotic tissue within the wound bed including Adherent Slough. Wound #14 status is Open. Original cause of wound was Pressure Injury. The date acquired was: 05/11/2021. The wound has been in treatment 2 weeks. The wound is located on the Left,Posterior Calcaneus. The wound measures 1.3cm length x 1.4cm width x 0.1cm depth; 1.429cm^2 area and 0.143cm^3 volume. There is Fat Layer (Subcutaneous Tissue) exposed. There is no tunneling or undermining noted. There is a medium amount of serous drainage noted. The wound margin is flat and intact. There is no granulation within the wound bed. There is a large (67-100%)  amount of necrotic tissue within the wound bed. 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 There was a Excisional Skin/Subcutaneous Tissue Debridement with a total area of 40.7 sq cm performed by Ricard Dillon., MD. With the following instrument(s): Curette to remove Viable and Non-Viable tissue/material. Material removed includes Subcutaneous Tissue, Slough, and Biofilm. No specimens were taken. A time out was conducted at 10:12, prior to the start of the procedure. A Minimum amount of bleeding was controlled with Pressure. The procedure was tolerated well with a pain level of 0 throughout and a pain level of 0 following the procedure. Post Debridement Measurements: 5.5cm length x 7.4cm width x 0.6cm depth; 19.179cm^3 volume. Character of Wound/Ulcer Post Debridement requires further debridement. Severity of Tissue Post Debridement is: Fat layer exposed. Post procedure Diagnosis Wound #13: Same as Pre-Procedure Wound #14 Pre-procedure diagnosis of Wound #14 is a Diabetic Wound/Ulcer of the Lower Extremity located on the Left,Posterior Calcaneus .Severity of Tissue Pre Debridement is: Fat layer exposed. There was a Chemical/Enzymatic/Mechanical debridement performed by [name], RN.Marland Kitchen Agent used was [employer]. A time out was conducted at 10:20, prior to the start of the procedure. There was no bleeding.  The procedure was tolerated well with a pain level of 0 throughout and a pain level of 0 following the procedure. Post Debridement Measurements: 1.3cm length x 1.4cm width x 0.1cm depth; 0.143cm^3 volume. Character of Wound/Ulcer Post Debridement requires further debridement. Severity of Tissue Post Debridement is: Fat layer exposed.  Post procedure Diagnosis Wound #14: Same as Pre-Procedure Plan Follow-up Appointments: Return Appointment in 1 week. - with Dr. Dellia Nims Bathing/ Shower/ Hygiene: May shower and wash wound with soap and water. Off-Loading: Open toe surgical shoe to: - right foot Wedge shoe to: - left foot Home Health: No change in wound care orders this week; continue Home Health for wound care. May utilize formulary equivalent dressing for wound treatment orders unless otherwise specified. Other Home Health Orders/Instructions: - [facility] 3x a week The following medication(s) was prescribed: doxycycline monohydrate oral 100 mg capsule 1 capsule oral bid for 7 days for cellulitis left heel starting 06/29/2021 WOUND #13: - Amputation Site - Transmetatarsal Wound Laterality: Left Cleanser: Soap and Water 1 x Per Day/7 Days Discharge Instructions: May shower and wash wound with dial antibacterial soap and water prior to dressing change. Cleanser: Wound Cleanser (Home Health) 1 x Per Day/7 Days Discharge Instructions: Cleanse the wound with wound cleanser or normal saline prior to applying a clean dressing using gauze sponges, not tissue or cotton balls. Prim Dressing: KerraCel Ag Gelling Fiber Dressing, 4x5 in (silver alginate) (Home Health) 1 x Per Day/7 Days ary Discharge Instructions: Apply silver alginate over the santyl. Prim Dressing: Santyl Ointment 1 x Per Day/7 Days ary Discharge Instructions: Apply nickel thick amount to wound bed as instructed Secondary Dressing: Woven Gauze Sponge, Non-Sterile 4x4 in (Home Health) 1 x Per Day/7 Days Discharge Instructions: Apply over  primary dressing as directed. Secondary Dressing: ABD Pad, 5x9 (Home Health) 1 x Per Day/7 Days Discharge Instructions: Apply over primary dressing as directed. Secured With: [employer], 4.5x3.1 (in/yd) (Home Health) 1 x Per Day/7 Days Discharge Instructions: Secure with Kerlix as directed. Secured With: Paper T ape, 2x10 (in/yd) (Home Health) 1 x Per Day/7 Days Discharge Instructions: Secure dressing with tape as directed. WOUND #14: - Calcaneus Wound Laterality: Left, Posterior Cleanser: Soap and Water 1 x Per Day/7 Days Discharge Instructions: May shower and wash wound with dial antibacterial soap and water prior to dressing change. Cleanser: Wound Cleanser (Home Health) 1 x Per Day/7 Days Discharge Instructions: Cleanse the wound with wound cleanser or normal saline prior to applying a clean dressing using gauze sponges, not tissue or cotton balls. Prim Dressing: KerraCel Ag Gelling Fiber Dressing, 4x5 in (silver alginate) (Home Health) 1 x Per Day/7 Days ary Discharge Instructions: Apply silver alginate over santyl. Prim Dressing: Santyl Ointment 1 x Per Day/7 Days ary Discharge Instructions: Apply nickel thick amount to wound bed as instructed Secondary Dressing: Woven Gauze Sponge, Non-Sterile 4x4 in (Home Health) 1 x Per Day/7 Days Discharge Instructions: Apply over primary dressing as directed. Secondary Dressing: ALLEVYN Heel 4 1/2in x 5 1/2in / 10.5cm x 13.5cm (Home Health) 1 x Per Day/7 Days Discharge Instructions: Apply over primary dressing as directed. Secured With: [employer], 4.5x3.1 (in/yd) (Home Health) 1 x Per Day/7 Days Discharge Instructions: Secure with Kerlix as directed. Secured With: Paper T ape, 2x10 (in/yd) (Home Health) 1 x Per Day/7 Days Discharge Instructions: Secure dressing with tape as directed. 1. Largely ischemic wounds on the left foot. 2. She completed the ciprofloxacin I gave her last week for the cyan fluorescence on  MolecuLight. 3. Erythema which is tender around the wound on the Achilles heel I gave her doxycycline today 100 twice daily x7 days no cultures or nothing seemed amenable to a culture 4. I managed to clean up the TMA site except for the plantar aspect really quite well. A more aggressive  debridement today. 5. I am going to continue the Santyl with the covering silver alginate for another week I may consider changing the dressing at that point 6. Consider repeating MolecuLight depending on the condition of the TMA wound 7. I think this patient has claudication at rest. I will need to see when her follow-up is with vein and vascular. I know there is probably not a lot of options for revascularization Electronic Signature(s) Signed: 06/29/2021 5:45:05 PM By: [name] MD Entered By: [name] on 06/29/2021 10:51:48 -------------------------------------------------------------------------------- SuperBill Details Patient Name: Date of Service: [name], [name] 06/29/2021 Medical Record Number: [id number] Patient Account Number: [id number] Date of Birth/Sex: Treating RN: 1940-12-13 (80 y.o. 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 Facility Procedures CPT4 Code: 12224114 Description: 64314 - DEBRIDE W/O ANES NON SELECT Modifier: Quantity: 1 CPT4 Code: 27670110 Description:  03496 - DEB SUBQ TISSUE 20 SQ CM/< ICD-10 Diagnosis Description E11.621 Type 2 diabetes mellitus with foot ulcer L97.528 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 Modifier: ity Quantity: 1 CPT4 Code: [id number] Description: 11045 - DEB SUBQ TISS EA ADDL 20CM ICD-10 Diagnosis Description E11.621 Type 2 diabetes mellitus with foot ulcer L97.528 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 Modifier: ity Quantity: 2 Physician Procedures : CPT4 Code Description Modifier [id number] 62194 - WC PHYS SUBQ TISS 20 SQ CM ICD-10 Diagnosis Description E11.621 Type 2 diabetes mellitus with foot ulcer L97.528 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ity Quantity: 1 : [id number] 29290 - WC PHYS SUBQ TISS EA ADDL 20 CM ICD-10 Diagnosis Description E11.621 Type 2 diabetes mellitus with foot ulcer L97.528 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ity Quantity: 2 Electronic Signature(s) Signed: 06/29/2021 5:45:05 PM By: [name] MD Entered By: [name] on 06/29/2021 10:52:18

## 2021-06-30 DIAGNOSIS — D638 Anemia in other chronic diseases classified elsewhere: Secondary | ICD-10-CM | POA: Diagnosis not present

## 2021-06-30 DIAGNOSIS — L03116 Cellulitis of left lower limb: Secondary | ICD-10-CM | POA: Diagnosis not present

## 2021-06-30 DIAGNOSIS — I70202 Unspecified atherosclerosis of native arteries of extremities, left leg: Secondary | ICD-10-CM | POA: Diagnosis not present

## 2021-06-30 DIAGNOSIS — M86172 Other acute osteomyelitis, left ankle and foot: Secondary | ICD-10-CM | POA: Diagnosis not present

## 2021-06-30 DIAGNOSIS — I10 Essential (primary) hypertension: Secondary | ICD-10-CM | POA: Diagnosis not present

## 2021-06-30 DIAGNOSIS — E11628 Type 2 diabetes mellitus with other skin complications: Secondary | ICD-10-CM | POA: Diagnosis not present

## 2021-07-03 ENCOUNTER — Ambulatory Visit (INDEPENDENT_AMBULATORY_CARE_PROVIDER_SITE_OTHER): Payer: Medicare Other | Admitting: Endocrinology

## 2021-07-03 ENCOUNTER — Encounter: Payer: Self-pay | Admitting: Endocrinology

## 2021-07-03 ENCOUNTER — Other Ambulatory Visit: Payer: Self-pay

## 2021-07-03 VITALS — BP 144/56 | HR 58 | Ht 64.5 in | Wt 141.4 lb

## 2021-07-03 DIAGNOSIS — D638 Anemia in other chronic diseases classified elsewhere: Secondary | ICD-10-CM | POA: Diagnosis not present

## 2021-07-03 DIAGNOSIS — I10 Essential (primary) hypertension: Secondary | ICD-10-CM | POA: Diagnosis not present

## 2021-07-03 DIAGNOSIS — M86172 Other acute osteomyelitis, left ankle and foot: Secondary | ICD-10-CM | POA: Diagnosis not present

## 2021-07-03 DIAGNOSIS — I70202 Unspecified atherosclerosis of native arteries of extremities, left leg: Secondary | ICD-10-CM | POA: Diagnosis not present

## 2021-07-03 DIAGNOSIS — I6523 Occlusion and stenosis of bilateral carotid arteries: Secondary | ICD-10-CM | POA: Diagnosis not present

## 2021-07-03 DIAGNOSIS — E11628 Type 2 diabetes mellitus with other skin complications: Secondary | ICD-10-CM | POA: Diagnosis not present

## 2021-07-03 DIAGNOSIS — E1165 Type 2 diabetes mellitus with hyperglycemia: Secondary | ICD-10-CM | POA: Diagnosis not present

## 2021-07-03 DIAGNOSIS — L03116 Cellulitis of left lower limb: Secondary | ICD-10-CM | POA: Diagnosis not present

## 2021-07-03 NOTE — Progress Notes (Signed)
Patient ID: [name], female   DOB: Oct 17, 1940, 80 y.o.   MRN: [id number]          Reason for Appointment: Follow-up for endocrinology problems   History of Present Illness:          Date of diagnosis of type 2 diabetes mellitus: 1984       Background history:   She has been on metformin since about the time of her diagnosis and has been on other drugs also but has not had many changes in her regimen in the last several years She thinks her blood sugar used to be fairly well controlled At some point she was given Byetta for about a year and a half which improved her control but this was subsequently start because of it being too expensive for her.  She had no side effects with this  Recent history:   Her A1c is 5.8, also has had significant anemia recently  Non-insulin hypoglycemic regimen: Amaryl 2 mg in a.m., Invokana 300 mg daily,   metformin 1 g  twice daily  Current management, level of control, blood sugar patterns and problems identified:  Fructosamine is 237, improved  She is checking her blood sugars mostly in the mornings and not regularly  She was told to take only half a tablet of metformin in the evening but is taking the full 1000 mg tablet  With this her blood sugars in the mornings are usually low normal including a reading of 63  She has only sporadic high blood sugars but not monitoring after meals  Despite reminders she is still sometimes eating cereal and fruit or oatmeal without any protein in the mornings  She may also have some boost in the mornings Is quite inactive because of recent foot infection and toe amputation  Weight has fluctuated and she is down significantly since her last visit because of intercurrent illness and somewhat decreased appetite         Side effects from medications have been: None  Compliance with the medical regimen: Good Hypoglycemia:   None  Glucose monitoring:  About 1 times a day         Glucometer: One Touch.        Blood Glucose readings    PRE-MEAL Fasting Lunch Dinner Bedtime Overall  Glucose range: 63-1 05    63-192  Mean/median: 88    118   POST-MEAL PC Breakfast PC Lunch PC Dinner  Glucose range:   191  Mean/median:      Previously:   PRE-MEAL Fasting Lunch Dinner Bedtime Overall  Glucose range: 65-134    65-304  Mean/median: 99    139   POST-MEAL PC Breakfast PC Lunch PC Dinner  Glucose range: 133-304    Mean/median: 185 127         Self-care: The diet that the patient has been following is: tries to limit sweets and fried food.     Meal times are:  Breakfast is at 8:30 AM lunch: 1 PM, dinner: 6:30 PM  Typical meal intake: Breakfast is yogurt/cereal with fruit or egg/toast or oatmeal Sometimes drinking boost  Lunch is usually soup with crackers, dinner is usually a low-fat meat with vegetables Snacks will be celery and ranch dressing               Dietician visit, most recent: 10/18                Weight history:  Wt Readings from Last  3 Encounters:  07/03/21 141 lb 6.4 oz (64.1 kg)  06/13/21 140 lb (63.5 kg)  06/06/21 135 lb 12.8 oz (61.6 kg)    Glycemic control:   Lab Results  Component Value Date   HGBA1C 5.8 06/28/2021   HGBA1C 7.2 (H) 04/27/2021   HGBA1C 7.7 (H) 01/18/2021   Lab Results  Component Value Date   MICROALBUR 1.0 08/30/2020   LDLCALC 42 04/28/2021   CREATININE 1.26 (H) 06/28/2021   Lab Results  Component Value Date   MICRALBCREAT 2.0 08/30/2020    Lab Results  Component Value Date   FRUCTOSAMINE 237 03/29/2021   FRUCTOSAMINE 243 03/18/2019   FRUCTOSAMINE 268 10/21/2018   Lab Results  Component Value Date   HGB 8.2 (L) 05/15/2021      Allergies as of 07/03/2021       Reactions   Bactrim [sulfamethoxazole-trimethoprim] Other (See Comments)   ^ K+( elevated)    Demerol [meperidine]    Delusional    Scopolamine    Delusional    Tramadol Other (See Comments)   Keeps awake   Versed [midazolam] Anxiety   Frantic, out of  my mind, agitated         Medication List        Accurate as of July 03, 2021  3:18 PM. If you have any questions, ask your nurse or doctor.          acetaminophen 500 MG tablet Commonly known as: TYLENOL Take 1,000 mg by mouth every 6 (six) hours as needed for moderate pain or headache.   aspirin EC 81 MG tablet Take 81 mg by mouth at bedtime.   atorvastatin 80 MG tablet Commonly known as: LIPITOR Take 80 mg by mouth daily.   brimonidine 0.2 % ophthalmic solution Commonly known as: ALPHAGAN Place 1 drop into the right eye 2 (two) times daily.   canagliflozin 300 MG Tabs tablet Commonly known as: INVOKANA Take 300 mg by mouth daily before breakfast.   CINNAMON PO Take 2 tablets by mouth in the morning, at noon, and at bedtime.   collagenase ointment Commonly known as: SANTYL Apply 1 application topically daily.   [employer] SENIOR PROBIOTIC PO Take 1 capsule by mouth daily with breakfast.   doxycycline 100 MG capsule Commonly known as: MONODOX Take 100 mg by mouth 2 (two) times daily.   glimepiride 2 MG tablet Commonly known as: AMARYL TAKE 1 TABLET BY MOUTH ONCE DAILY BEFORE BREAKFAST   hydrochlorothiazide 12.5 MG tablet Commonly known as: HYDRODIURIL Take 1 tablet by mouth once daily   metFORMIN 1000 MG tablet Commonly known as: GLUCOPHAGE Take 1 tablet by mouth twice daily What changed: when to take this   metoprolol tartrate 25 MG tablet Commonly known as: LOPRESSOR Take 1.5 tablets (37.5 mg total) by mouth 2 (two) times daily.   OneTouch Delica Plus [id number] Misc Use to check blood sugars twice daily.   OneTouch Verio test strip Generic drug: glucose blood Use to check blood sugars once daily   oxyCODONE 5 MG immediate release tablet Commonly known as: Oxy IR/ROXICODONE Take 1-2 tablets (5-10 mg total) by mouth every 4 (four) hours as needed for moderate pain.   pantoprazole 40 MG tablet Commonly known as: PROTONIX Take 1 tablet (40 mg  total) by mouth daily. What changed:  when to take this additional instructions   pyridoxine 100 MG tablet Commonly known as: B-6 Take 100 mg by mouth every evening.   rivaroxaban 2.5 MG Tabs tablet Commonly known  as: XARELTO Take 1 tablet (2.5 mg total) by mouth 2 (two) times daily.   SYSTANE OP Place 1 drop into the left eye 2 (two) times daily as needed (Dry eyes).   telmisartan 80 MG tablet Commonly known as: MICARDIS Take 1 tablet by mouth once daily   vitamin B-12 500 MCG tablet Commonly known as: CYANOCOBALAMIN Take 500 mcg by mouth daily.   vitamin C 100 MG tablet Take 100 mg by mouth daily with supper.        Allergies:  Allergies  Allergen Reactions   Bactrim [Sulfamethoxazole-Trimethoprim] Other (See Comments)    ^ K+( elevated)    Demerol [Meperidine]     Delusional    Scopolamine     Delusional    Tramadol Other (See Comments)    Keeps awake   Versed [Midazolam] Anxiety    Frantic, out of my mind, agitated     Past Medical History:  Diagnosis Date   Anemia    after CABG in june 2021   Arthritis    Cancer ([place])    removal from nose - MOSE procedure    Complication of anesthesia    VERSED- agitated, muscle spasms, "jerking" , frantic , (never had this occurence in the pas)    Coronary artery disease    Diabetes mellitus without complication ([place])    Dysrhythmia    PVC's   GERD (gastroesophageal reflux disease)    Heart murmur    History of hiatal hernia    Hyperlipidemia    Hypertension 11/20/11   ECHO- EF>55% Borderline concentric left ventricular hypertrophy. There is a small calcified mass in the L:A near the LA appendage. No valvular masses seen with associated mitral annular calcification. LA Volume/ BSA27.4 ml/m2 No AS. Right ventricular systolic pressure is elevated at 88mmHg.   Myocardial infarction [facility])    June 2021   Neuromuscular disorder ([place])    neuropathy in bilateral feet   Peripheral vascular disease ([place])    Pneumonia     not hosp.    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  PTCAs with drug coated balloon Lt ant tibial artery and Lt popliteal artery  11/26/2019    Past Surgical History:  Procedure Laterality Date   ABDOMINAL AORTAGRAM  11/25/2019   ABDOMINAL AORTOGRAM W/LOWER EXTREMITY    ABDOMINAL AORTOGRAM N/A 06/15/2019   Procedure: ABDOMINAL AORTOGRAM;  Surgeon: [name], MD;  Location: [place] [facility];  Service: Cardiovascular;  Laterality: N/A;   ABDOMINAL AORTOGRAM W/LOWER EXTREMITY N/A 11/25/2019   Procedure: ABDOMINAL AORTOGRAM W/LOWER EXTREMITY;  Surgeon: [name], MD;  Location: [place] [facility];  Service: Cardiovascular;  Laterality: N/A;  Lt leg   ABDOMINAL AORTOGRAM W/LOWER EXTREMITY N/A 08/01/2020   Procedure: ABDOMINAL AORTOGRAM W/LOWER EXTREMITY;  Surgeon: [name], MD;  Location: [place] [facility];  Service: Cardiovascular;  Laterality: N/A;   ABDOMINAL AORTOGRAM W/LOWER EXTREMITY N/A 04/28/2021   Procedure: ABDOMINAL AORTOGRAM W/LOWER EXTREMITY;  Surgeon: [name], MD;  Location: [place] [facility];  Service: Cardiovascular;  Laterality: N/A;   AMPUTATION TOE Left 05/03/2021   Procedure: AMPUTATION OF THIRD LEFT  TOE;  Surgeon: [name], MD;  Location: [facility] OR;  Service: Vascular;  Laterality: Left;   APPENDECTOMY     APPLICATION OF WOUND VAC Right 07/21/2019   Procedure: Application Of Prevena Wound Vac Right Groin;  Surgeon: [name], MD;  Location: [place];  Service: Vascular;  Laterality: Right;  CARDIAC CATHETERIZATION     CARPAL TUNNEL RELEASE     CARPAL TUNNEL RELEASE     CESAREAN SECTION     x 2   COLONOSCOPY     CORONARY ARTERY BYPASS GRAFT N/A 03/24/2020   Procedure: CORONARY ARTERY BYPASS GRAFTING (CABG) using LIMA to LAD (m); RIMA to RAMUS; Endoscopic Right Greater Saphenous Vein: SVG to Diag1; SVG to PLB (right); and SVG to PL (left).;  Surgeon: [name], MD;  Location: [place];  Service: Open Heart Surgery;   Laterality: N/A;  BILATERAL IMA   ENDARTERECTOMY FEMORAL Right 07/21/2019   Procedure: RIGHT ENDARTERECTOMY FEMORAL WITH PATCH ANGIOPLASTY;  Surgeon: [name], MD;  Location: [place];  Service: Vascular;  Laterality: Right;   ENDARTERECTOMY FEMORAL Right 08/16/2020   Procedure: RIGHT FEMORAL ENDARTERECTOMY;  Surgeon: [name], MD;  Location: [place];  Service: Vascular;  Laterality: Right;   ENDOVEIN HARVEST OF GREATER SAPHENOUS VEIN Right 03/24/2020   Procedure: ENDOVEIN HARVEST OF GREATER SAPHENOUS VEIN;  Surgeon: [name], MD;  Location: [place];  Service: Open Heart Surgery;  Laterality: Right;   EYE SURGERY     cataract removal bilaterally   FEMORAL-POPLITEAL BYPASS GRAFT Right 08/16/2020   Procedure: BYPASS GRAFT FEMORAL-POPLITEAL ARTERY;  Surgeon: [name], MD;  Location: [place];  Service: Vascular;  Laterality: Right;   FEMORAL-POPLITEAL BYPASS GRAFT Left 05/03/2021   Procedure: LEFT FEMORAL TO BELOW KNEE POPLITEAL ARTERY BYPASS GRAFTING WITH 6MMX80 PTFE GRAFT;  Surgeon: [name], MD;  Location: [place];  Service: Vascular;  Laterality: Left;   INTRAVASCULAR LITHOTRIPSY  11/25/2019   Procedure: INTRAVASCULAR LITHOTRIPSY;  Surgeon: [name], MD;  Location: [place] [facility];  Service: Cardiovascular;;  LT. SFA   LEFT HEART CATH AND CORONARY ANGIOGRAPHY N/A 03/22/2020   Procedure: LEFT HEART CATH AND CORONARY ANGIOGRAPHY;  Surgeon: Martinique, Peter M, MD;  Location: [place] [facility];  Service: Cardiovascular;  Laterality: N/A;   LOWER EXTREMITY ANGIOGRAPHY Bilateral 06/15/2019   Procedure: Lower Extremity Angiography;  Surgeon: [name], MD;  Location: [place] [facility];  Service: Cardiovascular;  Laterality: Bilateral;   LOWER EXTREMITY ANGIOGRAPHY Right 08/03/2019   Procedure: LOWER EXTREMITY ANGIOGRAPHY;  Surgeon: [name], MD;  Location: [place] [facility];  Service: Cardiovascular;  Laterality: Right;   PATCH  ANGIOPLASTY Right 07/21/2019   Procedure: Patch Angioplasty Right Femoral Artery;  Surgeon: [name], MD;  Location: [place];  Service: Vascular;  Laterality: Right;   PERIPHERAL VASCULAR ATHERECTOMY  08/03/2019   Procedure: PERIPHERAL VASCULAR ATHERECTOMY;  Surgeon: [name], MD;  Location: [place] [facility];  Service: Cardiovascular;;  right SFA, right TP trunk   PERIPHERAL VASCULAR ATHERECTOMY  11/25/2019   Procedure: PERIPHERAL VASCULAR ATHERECTOMY;  Surgeon: [name], MD;  Location: [place] [facility];  Service: Cardiovascular;;  Lt.  POPLITEAL and AT   PERIPHERAL VASCULAR BALLOON ANGIOPLASTY Left 06/15/2019   Procedure: PERIPHERAL VASCULAR BALLOON ANGIOPLASTY;  Surgeon: [name], MD;  Location: [place] [facility];  Service: Cardiovascular;  Laterality: Left;  SFA UNSUCCESSFUL UNABLE TO CROSS LESION   PERIPHERAL VASCULAR BALLOON ANGIOPLASTY  08/03/2019   Procedure: PERIPHERAL VASCULAR BALLOON ANGIOPLASTY;  Surgeon: [name], MD;  Location: [place] [facility];  Service: Cardiovascular;;  right SFA, Right TP trunk   TEE WITHOUT CARDIOVERSION N/A 03/24/2020   Procedure: TRANSESOPHAGEAL ECHOCARDIOGRAM (TEE);  Surgeon: [name], MD;  Location: [place];  Service:  Open Heart Surgery;  Laterality: N/A;   TONSILLECTOMY     and adenoidectomy   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/2019   Procedure: TRANSMETATARSAL AMPUTATION;  Surgeon: Trula Slade, DPM;  Location: [place];  Service: Podiatry;  Laterality: Right;   TRANSMETATARSAL AMPUTATION Left 05/11/2021   Procedure: LEFT TRANSMETATARSAL AMPUTATION;  Surgeon: [name], MD;  Location: [facility] OR;  Service: Vascular;  Laterality: Left;   TUBAL LIGATION      Family History  Problem Relation Age of Onset   Cancer - Prostate Father    Cancer - Colon Father    Stroke Mother    Hypertension Mother    Hyperlipidemia Mother    Melanoma Brother     Social History:  reports that she quit  smoking about 49 years ago. Her smoking use included cigarettes. She has never used smokeless tobacco. She reports that she does not drink alcohol and does not use drugs.   Review of Systems   Lipid history: Hypercholesterolemia by PCP with 20 mg Lipitor    Lab Results  Component Value Date   CHOL 86 04/28/2021   HDL 26 (L) 04/28/2021   LDLCALC 42 04/28/2021   TRIG 89 04/28/2021   CHOLHDL 3.3 04/28/2021           Hypertension: Present for several years, currently treated with  Micardis 40 mg, metoprolol, diltiazem and HCTZ, also followed by cardiologist  She monitors at home    BP Readings from Last 3 Encounters:  07/03/21 (!) 144/56  06/13/21 (!) 141/61  06/06/21 (!) 154/69   Renal function history:  Lab Results  Component Value Date   CREATININE 1.26 (H) 06/28/2021   CREATININE 0.80 05/15/2021   CREATININE 0.75 05/13/2021     Most recent eye exam was in 4/21    Lab Results  Component Value Date   HGB 8.2 (L) 05/15/2021     She has had significant PAD followed by vascular cardiologist   LABS:  Lab on 06/28/2021  Component Date Value Ref Range Status   Sodium 06/28/2021 139  135 - 145 mEq/L Final   Potassium 06/28/2021 4.7  3.5 - 5.1 mEq/L Final   Chloride 06/28/2021 103  96 - 112 mEq/L Final   CO2 06/28/2021 24  19 - 32 mEq/L Final   Glucose, Bld 06/28/2021 203 (A) 70 - 99 mg/dL Final   BUN 06/28/2021 29 (A) 6 - 23 mg/dL Final   Creatinine, Ser 06/28/2021 1.26 (A) 0.40 - 1.20 mg/dL Final   GFR 06/28/2021 40.67 (A) >60.00 mL/min Final   Calculated using the CKD-EPI Creatinine Equation (2021)   Calcium 06/28/2021 10.5  8.4 - 10.5 mg/dL Final   Hgb A1c MFr Bld 06/28/2021 5.8  4.6 - 6.5 % Final   Glycemic Control Guidelines for People with Diabetes:Non Diabetic:  <6%Goal of Therapy: <7%Additional Action Suggested:  >8%     Physical Examination:  BP (!) 144/56   Pulse (!) 58   Ht 5' 4.5" (1.638 m)   Wt 141 lb 6.4 oz (64.1 kg)   SpO2 98%   BMI  23.90 kg/m       ASSESSMENT:  Diabetes type 2  See history of present illness for detailed discussion of current diabetes management, blood sugar patterns and problems identified  Her A1c is likely falsely low at 5.8 because of anemia However her blood sugars at home are fairly good although may be higher after meals especially breakfast Her lab glucose was 203 but she has not checked  readings after breakfast when she is getting more carbohydrate and less protein  She has lost weight because of intercurrent illness Also fasting readings are low normal or slightly low  PLAN:    She will reduce the evening metformin to 500 mg to help reduce tendency to low normal fasting readings She needs to make sure she has some protein at breakfast She can have her boost drink at lunch or in between meals rather than at breakfast She needs to check readings couple hours after one of her meals more regularly and less in the morning Check fructosamine on her next visit   There are no Patient Instructions on file for this visit.   Elayne Snare 07/03/2021, 3:18 PM   Note: This office note was prepared with Dragon voice recognition system technology. Any transcriptional errors that result from this process are unintentional.

## 2021-07-03 NOTE — Patient Instructions (Addendum)
Take Metformin 1 in am and 1/2 in pm.  Check blood sugars on waking up 2-3 days a week  Also check blood sugars about 2 hours after meals and do this after different meals by rotation  Recommended blood sugar levels on waking up are 90-130 and about 2 hours after meal is 130-160  Please bring your blood sugar monitor to each visit, thank you  Protein at Breakfast

## 2021-07-05 ENCOUNTER — Ambulatory Visit: Payer: Medicare Other | Admitting: Endocrinology

## 2021-07-05 DIAGNOSIS — I70202 Unspecified atherosclerosis of native arteries of extremities, left leg: Secondary | ICD-10-CM | POA: Diagnosis not present

## 2021-07-05 DIAGNOSIS — D638 Anemia in other chronic diseases classified elsewhere: Secondary | ICD-10-CM | POA: Diagnosis not present

## 2021-07-05 DIAGNOSIS — M86172 Other acute osteomyelitis, left ankle and foot: Secondary | ICD-10-CM | POA: Diagnosis not present

## 2021-07-05 DIAGNOSIS — E11628 Type 2 diabetes mellitus with other skin complications: Secondary | ICD-10-CM | POA: Diagnosis not present

## 2021-07-05 DIAGNOSIS — L03116 Cellulitis of left lower limb: Secondary | ICD-10-CM | POA: Diagnosis not present

## 2021-07-05 DIAGNOSIS — I10 Essential (primary) hypertension: Secondary | ICD-10-CM | POA: Diagnosis not present

## 2021-07-06 ENCOUNTER — Other Ambulatory Visit: Payer: Self-pay

## 2021-07-06 ENCOUNTER — Encounter (HOSPITAL_BASED_OUTPATIENT_CLINIC_OR_DEPARTMENT_OTHER): Payer: Medicare Other | Admitting: Internal Medicine

## 2021-07-06 DIAGNOSIS — I2510 Atherosclerotic heart disease of native coronary artery without angina pectoris: Secondary | ICD-10-CM | POA: Diagnosis not present

## 2021-07-06 DIAGNOSIS — I10 Essential (primary) hypertension: Secondary | ICD-10-CM | POA: Diagnosis not present

## 2021-07-06 DIAGNOSIS — E11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: Secondary | ICD-10-CM | POA: Diagnosis not present

## 2021-07-06 DIAGNOSIS — L97522 Non-pressure chronic ulcer of other part of left foot with fat layer exposed: Secondary | ICD-10-CM | POA: Diagnosis not present

## 2021-07-06 DIAGNOSIS — L97529 Non-pressure chronic ulcer of other part of left foot with unspecified severity: Secondary | ICD-10-CM | POA: Diagnosis not present

## 2021-07-06 DIAGNOSIS — L97429 Non-pressure chronic ulcer of left heel and midfoot with unspecified severity: Secondary | ICD-10-CM | POA: Diagnosis not present

## 2021-07-06 DIAGNOSIS — L97422 Non-pressure chronic ulcer of left heel and midfoot with fat layer exposed: Secondary | ICD-10-CM | POA: Diagnosis not present

## 2021-07-06 DIAGNOSIS — E11621 Type 2 diabetes mellitus with foot ulcer: Secondary | ICD-10-CM | POA: Diagnosis not present

## 2021-07-06 NOTE — Progress Notes (Signed)
[name], [name] ([id number]) Visit Report for 07/06/2021 Debridement Details Patient Name: Date of Service: [name] 07/06/2021 9:45 A M Medical Record Number: [id number] Patient Account Number: [id number] Date of Birth/Sex: Treating RN: June 15, 1941 (80 y.o. [name], [name] Primary Care Provider: [name] Other Clinician: Referring Provider: Treating Provider/[name]: Tressie Stalker in Treatment: 3 Debridement Performed for Assessment: Wound #13 Left Amputation Site - Transmetatarsal Performed By: Physician Ricard Dillon., MD Debridement Type: Debridement Severity of Tissue Pre Debridement: Fat layer exposed Level of Consciousness (Pre-procedure): Awake and Alert Pre-procedure Verification/Time Out Yes - 10:55 Taken: Start Time: 10:58 Pain Control: Other : benzocaine 20% spray T Area Debrided (L x W): otal 3 (cm) x 0.8 (cm) = 2.4 (cm) Tissue and other material debrided: Viable, Non-Viable, Slough, Subcutaneous, Slough Level: Skin/Subcutaneous Tissue Debridement Description: Excisional Instrument: Curette Bleeding: Minimum Hemostasis Achieved: Pressure Procedural Pain: 2 Post Procedural Pain: 0 Response to Treatment: Procedure was tolerated well Level of Consciousness (Post- Awake and Alert procedure): Post Debridement Measurements of Total Wound Length: (cm) 5.5 Width: (cm) 6.8 Depth: (cm) 0.8 Volume: (cm) 23.499 Character of Wound/Ulcer Post Debridement: Improved Severity of Tissue Post Debridement: Fat layer exposed Post Procedure Diagnosis Same as Pre-procedure Electronic Signature(s) Signed: 07/06/2021 4:49:53 PM By: [name] MD Signed: 07/06/2021 5:46:14 PM By: Deon Pilling RN, BSN Entered By: [name] on 07/06/2021 11:17:31 -------------------------------------------------------------------------------- Debridement Details Patient Name: Date of Service: [name], [name]. 07/06/2021 9:45 A  M Medical Record Number: [id number] Patient Account Number: [id number] Date of Birth/Sex: Treating RN: Feb 05, 1941 (80 y.o. [name], [name] Primary Care Provider: [name] Other Clinician: Referring Provider: Treating Provider/[name]: Tressie Stalker in Treatment: 3 Debridement Performed for Assessment: Wound #14 Left,Posterior Calcaneus Performed By: Physician Ricard Dillon., MD Debridement Type: Debridement Severity of Tissue Pre Debridement: Fat layer exposed Level of Consciousness (Pre-procedure): Awake and Alert Pre-procedure Verification/Time Out Yes - 10:55 Taken: Start Time: 10:58 Pain Control: Other : benzocaine 20% spray T Area Debrided (L x W): otal 1.3 (cm) x 1.3 (cm) = 1.69 (cm) Tissue and other material debrided: Viable, Non-Viable, Slough, Subcutaneous, Slough Level: Skin/Subcutaneous Tissue Debridement Description: Excisional Instrument: Curette Bleeding: Minimum Hemostasis Achieved: Pressure End Time: 11:03 Procedural Pain: 2 Post Procedural Pain: 0 Response to Treatment: Procedure was tolerated well Level of Consciousness (Post- Awake and Alert procedure): Post Debridement Measurements of Total Wound Length: (cm) 1.3 Width: (cm) 1.3 Depth: (cm) 0.1 Volume: (cm) 0.133 Character of Wound/Ulcer Post Debridement: Improved Severity of Tissue Post Debridement: Fat layer exposed Post Procedure Diagnosis Same as Pre-procedure Electronic Signature(s) Signed: 07/06/2021 4:49:53 PM By: [name] MD Signed: 07/06/2021 5:46:14 PM By: Deon Pilling RN, BSN Entered By: [name] on 07/06/2021 11:17:45 -------------------------------------------------------------------------------- HPI Details Patient Name: Date of Service: [name], [name]. 07/06/2021 9:45 A M Medical Record Number: [id number] Patient Account Number: [id number] Date of Birth/Sex: Treating RN: 1941-01-01 (80 y.o. [name] Primary Care  Provider: [name] Other Clinician: Referring Provider: Treating Provider/[name]: Tressie Stalker in Treatment: 3 History of Present Illness HPI Description: ADMISSION 07/13/2019 Patient is a 80 year old type II diabetic. She has known PAD. She has been followed by Dr. Jacqualyn Posey of podiatry for blistering areas on her toes dating back to 04/30/2019 which at this was the left fourth toe. By 8/11 she had wounds on the right and left second toes. She underwent arterial studies by Dr. Alvester Chou on 7/31 that showed ABIs on the  right of 0.60 TBI of 0.26 on the left ABI of 0.56 and a TBI of 0.25. By 9/10 she had ischemic-looking wounds per Dr. Earleen Newport on the right first, left first second and third. She has been using Medihoney and then mupirocin more recently simply Betadine. The patient underwent angiography by Dr. Gwenlyn Found on 9/21. 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 Noted that there was 1 vessel runoff in the foot via the peroneal. On the left there was an 80% left SFA, 70% mid left SFA. There was a short segment calcified CTO distal less than SFA and above-knee popliteal artery reconstituting with two-vessel runoff. 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 It was felt that she had bilateral SFA disease as well as tibial vessel disease. An attempt was made to revascularize the left SFA but they were unable to cross because of the highly calcified nature of the lesion. 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 She has an area on the medial left fourth toe with raised horned callus on top of this. I am not certain what this represents. With regards to pain she has about a 2-1/2 I will claudication tolerance in the grocery  store. She has some pain in night which is relieved by putting her feet down over the side of the bed. She is wearing Nitro-Dur patches on the top of her right foot. Past medical history includes type 2 diabetes with secondary PAD, neoplasm of the skin, diabetic retinopathy, carotid artery stenosis, hyperlipidemia hypertension. 11/2; the last time the patient was here I spoke to Dr. Gwenlyn Found about revascularization options on the right. As I understand things currently Dr. Gwenlyn Found spoke with Dr. Trula Slade and ultimately the patient was taken to surgery on 10/27. She had a right iliofemoral endarterectomy with a bovine patch angioplasty. I think the plan now is for her to have a staged intervention on the right SFA by Dr. Alvester Chou. Per the patient's understanding Dr. Gwenlyn Found and Dr. Jacqualyn Posey are waiting to see when the gangrenous toes on the right foot can be amputated. The patient states her pain is a lot better and she is grateful for that. 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 Small area on the left fourth toe. She is using Betadine to the mummified areas on the right and Medihoney on the left 08/27/2019; the last time I saw this patient I discharged her from the clinic. She had been revascularized by Dr. Trula Slade and she had a right iliofemoral endarterectomy with a bovine angioplasty. 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 Jacqualyn Posey of podiatry on 08/07/2019. I note that she also had a intervention by Dr. Gwenlyn Found and he performed a directional atherectomy and drug-coated balloon angioplasty of the SFA and popliteal artery on the right. I am not certain of the date of Dr. Kennon Holter procedure as of the time of this dictation. She was referred back to [place] by Dr. Earleen Newport predominantly for follow-up of the left fourth toe. She still has sutures and stitches in the right TMA site. She states her pain is a lot better. She expresses  concern about the condition of the amputation site at the TMA. She is on  doxycycline I think prescribed by Dr. Earleen Newport. She is complaining of some pain at night 12/10; I spoke to Dr. Jacqualyn Posey last week. He removed the sutures on the right foot on Monday of this week. She has the area on the left fourth toe just proximal to the PIP and then the right TMA site. She is still on doxycycline and has enough through next week. Unfortunately the TMA site does not look good at all. Both on the lateral and medial part of the incisions are areas that probe to bone. There is purulence over the medial part which I have cultured. We will use silver alginate. Left fourth toe looks somewhat better but there was still exposed bone 12/17; patient has an MRI booked for 12/30. Culture I did last week showed Pseudomonas Serratia and Enterococcus. This was purulent drainage coming out of the medial part of her amputation site. I use cefdinir 300 twice daily for 10 days that started on 12/15. Her x-ray on the right showed limited evaluation for osteomyelitis. The findings could have been postoperative. There was subtle erosion in the distal first and distal fifth metatarsal. An MRI was suggested. On the left she had irregularity of the fourth middle and proximal phalanx consistent with a history of osteomyelitis. I do not know that she has a history of osteomyelitis in this area. She had a newly defined area on the plantar third toe 12/31; patient's MRI is listed below: MRI OF THE RIGHT FOREFOOT WITHOUT AND WITH CONTRAST TECHNIQUE: Multiplanar, multisequence MR imaging of the right forefoot was performed before and after the administration of intravenous contrast. CONTRAST 6 mL GADAVIST IV SOLN : COMPARISON: Plain films right foot 09/04/2019. FINDINGS: Bones/Joint/Cartilage The patient is status post transmetatarsal amputation as seen on the prior exam. Marrow edema and enhancement are seen in all of the distal  metatarsals. In the first metatarsal, signal change extends 1.5 cm proximal to the stump and in the second metatarsal extends approximately 2 cm proximal to the stump. Edema and enhancement are seen in only the distal 0.7 cm of the third metatarsal stump and tips of the fourth and fifth metatarsals. Bone marrow signal is otherwise unremarkable. A small focus of subchondral edema is seen in the lateral talus, likely degenerative. Ligaments Intact. Muscles and Tendons No intramuscular fluid collection. Soft tissues Skin ulceration is seen off the stump of the first metatarsal. A thin fluid collection tracks deep to the wound and over the anterior metatarsals worrisome for small abscess. Intense subcutaneous edema and enhancement are seen diffusely. 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 Cellulitis about the foot. Skin ulceration over the distal first metatarsal is identified with a thin fluid collection tracking anteriorly along the stump worrisome for abscess. Small focus of subchondral edema in the lateral talus is likely degenerative. Electronically Signed By: Inge Rise M.D. On: 09/23/2019 15:25 Patient arrives in clinic today not complaining any of any pain. 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 She does not describe claudication however her activity is very limited. 10/01/2019; since the patient was last here I have communicated with Dr. Gwenlyn Found after bypass by Dr. Trula Slade and addressing the right superficial femoral artery he states that she is widely patent through peroneal arteries to the ankle with collaterals to the dorsalis pedis. He states he is going to talk to  colleagues about the feasibility of tibial pedal access. The patient seems  infectious disease later this afternoon Dr. Baxter Flattery. in preparation for this she has been off antibiotics for 1 week and I went ahead and obtain pieces of the remanence of her first metatarsal for pathology and CandS. The patient is a candidate for hyperbaric oxygen. 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 1/15; considerable improvement in the wounds on her feet. We are using silver collagen. She follows with Dr. Baxter Flattery of infectious disease and is on meropenem and daptomycin. She has been taught how to do this herself at home. Follows with Dr. Graylon Good at the end of treatment here. She has 2 wounds on the surgical TMA site 1 lateral and 1 medial the lateral 1 has regressed tremendously. The area medially still has some exposed bone although the base of this looks healthy. 1/22; 2 separate wounds on the surgical TMA site. Both of these look satisfactory. The medial area does not have any exposed bone. This is an improvement On the left side fourth toe dorsally over the proximal phalanx there is a deep punched out area that probes to bone. She has an area on the tip of the left third toe. She also tells [place] that in HBO she traumatized her left Achilles and this is left her with a superficial wound area 1/28; weekly visit along with HBO. She has 5 wounds. T our punched out areas on the original TMA site on the right. Both of these appear to have contracted. o The area on the right no longer has exposed bone. She has an area on the tip of the left third toe and on the DIP of the left fourth toe. Both of these had surface callus that I removed and unfortunately they have small areas that both go to bone. She has a traumatic wound on the left Achilles area that happened in HBO and that appears better. She is tolerating her IV antibiotics well at home. She has home health changing the dressings and she is doing it once on the weekends. We have been using silver  collagen. She has been extensively revascularized on the right by Dr. Trula Slade and subsequently by Dr. Gwenlyn Found. 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 Baxter Flattery of infectious disease on 2/17. Dr. Gwenlyn Found on 2/23 and according to her her antibiotics stopped on 2/23. She is tolerating hyperbarics well. She has made a tremendous improvement in the right forefoot with only 2 smaller open wounds. Using silver collagen. On the left foot she has the area on the tip of the left third toe and the medial part of the left fourth toe. These had exposed bone last week I did not sense any of that today 2/12; sees Dr. Graylon Good on 2/17 and Dr. Gwenlyn Found on 2/23. We are going to use Dermagraft on this today however the lateral part of her train TMA incision on the right is healed and the medial part is down so much that we just continue with the silver collagen She has wounds on the tip of her left third and the medial aspect of the left fourth. Both of these still have exposed bone I have not been able to get these to epithelialize. 2/19; she sees Dr. Gwenlyn Found on 2/23 and I have communicated with him  about the left vascular supply. Looking at her angiogram from 08/03/2019 it looks as though that they could not cross the left CFA. Noted that she had one-vessel runoff bilaterally. She also apparently saw Dr. Graylon Good although I did not look up these results for preparation of this record. 2/26; Dr. Gwenlyn Found is going to do an angiogram next week on Wednesday. I think they are going to try to go at this both retrograde and anterograde to see if anything can be done to revascularize the left lower limb. She continues to make nice progress on the remaining wound on the right medial foot on her TMA site and the toe it wounds on the left are responding nicely as well 3/12; the patient underwent an extensive and  complicated revascularization on the left leg by Dr. Fletcher Anon on 3/3; she had an atherectomy on occluded left popliteal artery; anterior tibial artery followed by a balloon angioplasty. Atherectomy was also performed to the left SFA because there was still significant 50% stenosis in the left popliteal artery they performed an intravascular lithotripsy which improved the residual stenosis to 20%. The same lithotripsy was used to dilate the proximal left SFA. She had a drug-eluting stent placed I believe in the SFA. The patient returned for hyperbarics this week. She has had some eye problems on the right which she tells me are secondary to diabetic retinopathy and she saw her eye doctor. She is really made excellent progress there is no open wounds on the left foot at all. She has one open area medially and her TMA site on the right lateral wound is closed. 3/19; the patient comes in today with the area on the medial transmet on the right improved. Some debris removed from the surface revealed the still open wound. Unfortunately she now has the open area on the third toe tip and the medial aspect of the dorsal fourth toe. These are in the same location as her previous wounds. These had actually closed up last week. 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 She is undergone complex revascularizations on the right by Dr. Trula Slade and Dr. Gwenlyn Found and I believe Dr. 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 She completed IVantibiotics as directed by infectious disease and I believe a course of oral antibiotics as well.she underwent a complete 40 treatments of hyperbarics. She comes in today with the vast majority of the  right TMA site healed there is a small opening on the most medial aspect. Her left third and left fourth toes appear to have epithelialization although this has fluctuated somewhat. 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 She had a gaping wound on the right transmetatarsal amputation L as well as deep wounds on the left third and left fourth toes. She underwent complex revascularizations on her bilateral lower legs which I described on her last visit. She completed IV antibiotics by infectious disease and 40 treatments of hyperbarics. Her left third and fourth toes are healed. The area on the right transmetatarsal amputation site is also closed today. READMISSION 08/04/2020 [name] is now a 80 year old woman that we had for a complex stay in our clinic from October 2000 14 January 2020. She is a type II  diabetic with PAD. She has undergone a right transmetatarsal amputation. 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 Her current problems with wounds began on October 30 when she developed a spontaneous opening over the right transmet medial aspect over the first metatarsal head. She has been using collagen on this that she had leftover from her last stay in this clinic. She also has had an open area on the medial right calf from a vein harvest site from her CABG she. She said that this is never really closed since the vein harvest was done. With regards to her arterial status this is a major issue. She had an angiogram by Dr. Gwenlyn Found in September 2020. She developed a gangrenous right first and second toes. Ultimately she was referred to Dr. Trula Slade who did a right common femoral endarterectomy with patch angioplasty. This was on 07/21/2019 and she really had a good result. Dr. Gwenlyn Found did a atherectomy followed by a drug coated balloon angioplasty of her right SFA popliteal and tibial peroneal trunk on 08/03/2019 again with excellent  excellent results. Follow-up.Dopplers in November revealed revealed a widely patent SFA and tibioperoneal trunk. UNFORTUNATELY her recent angiogram that was done on 08/01/2020 showed an 80% right common femoral artery stenosis just above the previous endarterectomy site occluded right right SFA at the origin reconstituting the adductor canal by the profunda femoris collaterals. The profunda femoris is also diffusely diseased. There is and again another 80% tibioperoneal trunk stenosis and one-vessel runoff via the peroneal artery. She is seen Dr. Donzetta Matters and she is scheduled for a femoral endarterectomy and right femoral-popliteal bypass grafting with endarterectomy of her tibial peroneal trunk. The scheduled surgery is on 11/23 The patient does not complain of pain at rest. She does have 5-minute walking claudication. The wounds on her right first met head remanent was spontaneous she does not think she hit this. As mentioned in the area on her right medial calf is a vein harvest site Dr. Gwenlyn Found had texted me about getting her in the clinic and we have arranged this as soon as we can. I have told her that we will have to see how successful Dr. Donzetta Matters is before we know what can be done about the wounds in a precise fashion. Fortunately there does not seem to be any obvious infection involving the right foot although that may need to be looked at if things really deteriorate. She was treated with IV antibiotics and hyperbaric oxygen for her previous osteomyelitis 12/2; the patient underwent a right femoral endarterectomy and bypass femoral to popliteal artery. This was done on 08/16/2020. She is done remarkably well. She has a small area on the right anterior tibia and a small area in the medial part of her original right transmetatarsal amputation. She appears to have had an excellent response to surgery. 12/16; the patient's area on the right tibia and the small area on the medial part of her original right  transmetatarsal amputation is totally healed READMISSION 06/15/2021 [name] is a now 80 year old woman who arrives for review of wounds on the left TMA site extending into her plantar foot as well as an area on the left posterior calcaneus. 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 She was extensively revascularized on the right at that time as noted above in our previous notes ended up  receiving IV antibiotics and hyperbaric oxygen therapy. Miraculously this TMA site actually closed and she really has had not any trouble with this since. UNFORTUNATELY the same cannot be said of her left side. Her problem apparently started sometime in July when she hit her left third toe on a barstool while playing pool. She developed an acute infection which eventually led to underlying osteomyelitis. She underwent an extensive hospitalization from 04/27/2021 through 05/15/2021. She received IV antibiotics. Also extensive intervention by Dr. Stanford Breed of vein and vascular which included a left common femoral to tibioperoneal trunk bypass as well as a left third toe amputation on 05/03/2021. Unfortunately the area did not heal she ended up requiring a left TMA. By her description this was left open. She has been using a wound VAC on this to earlier this week and then was switched to Santyl. I think because of the left forefoot off loader she is also developed an area on the left posterior calcaneus. She is not complaining of a lot of pain. She has her daughter at home to change the dressings. She has been prescribed Santyl and she has a tube that she is brought into our clinic. I cannot get a lot of history of claudication although she is really not that mobile at this point walking with a walker. 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 She has had multiple interventions on the right side by vein and vascular and also by Dr. Gwenlyn Found as well as a transmetatarsal amputation on the right. She has had follow-up noninvasive studies on 06/06/2021; on the left that showed an ABI of 0.47 with monophasic waveforms a dorsalis pedis ABI of 0.40 with monophasic waveforms. Her ANGIOGRAM which was initially done by Dr. Oneida Alar on 04/28/2021 showed severe left foot superficial femoral artery occlusive disease and an occlusion of the left popliteal artery with two-vessel runoff to the left foot with moderate to severe disease below the knee popliteal artery and tibial disease. She had 80% distal right external iliac artery above the existing femoral above-knee popliteal bypass but she does not have an open wound on the right foot She arrives in clinic with an extensive open area on the TMA site which in the mid aspect extends into the plantar foot. She also has a more superficial area on the upper part of her Achilles area of the left heel 9/29; patient arrives in clinic today with slough over the TMA site. We use MolecuLight to look at the surface of this suggesting cyan and white discoloration over a large part of this wound also extending into the third metatarsal area. She also has the area on the left posterior calcaneus. This is slough covered we will switch to [facility] here as well. There is some warmth in the area and some erythema we will give her antibiotics as well 10/6; completely necrotic surface once again on the left TMA site. We have been using Santyl and Hydrofera Blue. She has an area on the left posterior Achilles and actually states this has been more painful this week indeed there is some erythema around this area. She also describes I think claudication at rest which is relieved by dipping her left leg over the side of the bed at night. 10/13; our intake nurse was able to brush some  slough off the surface of the left TMA hence this did  not require debridement that is better than last week. The area heading towards the plantar part of her foot required an aggressive debridement to clean this up. This is quite a sizable divot but does not go to bone. The area on the right Achilles heel is also covered in a high 100% nonviable tissue we have been using Santyl and silver alginate Electronic Signature(s) Signed: 07/06/2021 4:49:53 PM By: [name] MD Entered By: [name] on 07/06/2021 11:22:52 -------------------------------------------------------------------------------- Physical Exam Details Patient Name: Date of Service: [name], [name]. 07/06/2021 9:45 A M Medical Record Number: [id number] Patient Account Number: [id number] Date of Birth/Sex: Treating RN: 12-14-1940 (80 y.o. [name] Primary Care Provider: [name] Other Clinician: Referring Provider: Treating Provider/[name]: Tressie Stalker in Treatment: 3 Constitutional Sitting or standing Blood Pressure is within target range for patient.. Pulse regular and within target range for patient.Marland Kitchen Respirations regular, non-labored and within target range.Marland Kitchen Appears in no distress. Notes Wound exam; substantial wound over the left TMA this actually looks better than last week no debridement however the area coming off the mid part of this wound required a substantial debridement. This is a difficult divot. I remove necrotic subcutaneous tissue The area on the right posterior Achilles also required debridement with the 100% adherent surface. Electronic Signature(s) Signed: 07/06/2021 4:49:53 PM By: [name] MD Entered By: [name] on 07/06/2021 11:20:41 -------------------------------------------------------------------------------- Physician Orders Details Patient Name: Date of Service: [name], [name]. 07/06/2021 9:45 A M Medical Record  Number: [id number] Patient Account Number: [id number] Date of Birth/Sex: Treating RN: [birth year] (80 y.o. 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 - with Dr. Dellia Nims Return A Cellular or Tissue Based Products Cellular or Tissue Based Product Type: - run IVR for apligraf Bathing/ Shower/ Hygiene May shower and wash wound with soap and water. Off-Loading Open toe surgical [name] to: - right foot Wedge [name] to: - left foot Home Health No change in wound care orders this week; continue Home Health for wound care. May utilize formulary equivalent dressing for wound treatment orders unless otherwise specified. Other Home Health Orders/Instructions: - [facility] 3x a week Wound Treatment Wound #13 - Amputation Site - Transmetatarsal Wound Laterality: Left Cleanser: Soap and Water 1 x Per Day/7 Days Discharge Instructions: May shower and wash wound with dial antibacterial soap and water prior to dressing change. Cleanser: Wound Cleanser (Home Health) 1 x Per Day/7 Days Discharge Instructions: Cleanse the wound with wound cleanser or normal saline prior to applying a clean dressing using gauze sponges, not tissue or cotton balls. Prim Dressing: KerraCel Ag Gelling Fiber Dressing, 4x5 in (silver alginate) (Home Health) 1  x Per Day/7 Days ary Discharge Instructions: Apply silver alginate over the santyl. Prim Dressing: Santyl Ointment 1 x Per Day/7 Days ary Discharge Instructions: Apply nickel thick amount to wound bed as instructed Secondary Dressing: Woven Gauze Sponge, Non-Sterile 4x4 in (Home Health) 1 x Per Day/7 Days Discharge Instructions: Apply  over primary dressing as directed. Secondary Dressing: ABD Pad, 5x9 (Home Health) 1 x Per Day/7 Days Discharge Instructions: Apply over primary dressing as directed. Secured With: [employer], 4.5x3.1 (in/yd) (Home Health) 1 x Per Day/7 Days Discharge Instructions: Secure with Kerlix as directed. Secured With: Paper Tape, 2x10 (in/yd) (Home Health) 1 x Per Day/7 Days Discharge Instructions: Secure dressing with tape as directed. Wound #14 - Calcaneus Wound Laterality: Left, Posterior Cleanser: Soap and Water 1 x Per Day/7 Days Discharge Instructions: May shower and wash wound with dial antibacterial soap and water prior to dressing change. Cleanser: Wound Cleanser (Home Health) 1 x Per Day/7 Days Discharge Instructions: Cleanse the wound with wound cleanser or normal saline prior to applying a clean dressing using gauze sponges, not tissue or cotton balls. Prim Dressing: KerraCel Ag Gelling Fiber Dressing, 4x5 in (silver alginate) (Home Health) 1 x Per Day/7 Days ary Discharge Instructions: Apply silver alginate over santyl. Prim Dressing: Santyl Ointment 1 x Per Day/7 Days ary Discharge Instructions: Apply nickel thick amount to wound bed as instructed Secondary Dressing: Woven Gauze Sponge, Non-Sterile 4x4 in (Home Health) 1 x Per Day/7 Days Discharge Instructions: Apply over primary dressing as directed. Secondary Dressing: ALLEVYN Heel 4 1/2in x 5 1/2in / 10.5cm x 13.5cm (Home Health) 1 x Per Day/7 Days Discharge Instructions: Apply over primary dressing as directed. Secured With: [employer], 4.5x3.1 (in/yd) (Home Health) 1 x Per Day/7  Days Discharge Instructions: Secure with Kerlix as directed. Secured With: Paper Tape, 2x10 (in/yd) (Home Health) 1 x Per Day/7 Days Discharge Instructions: Secure dressing with tape as directed. Electronic Signature(s) Signed: 07/06/2021 4:48:42 PM By: Baruch Gouty RN, BSN Signed: 07/06/2021 4:49:53 PM By: [name] MD Entered By: Baruch Gouty on 07/06/2021 11:06:08 -------------------------------------------------------------------------------- Problem List Details Patient Name: Date of Service: [name], [name]. 07/06/2021 9:45 A M Medical Record Number: [id number] Patient Account Number: [id number] Date of Birth/Sex: Treating RN: 26-Nov-1940 (80 y.o. [name], [name] Primary Care Provider: [name] Other Clinician: Referring Provider: Treating Provider/[name]: Tressie Stalker in Treatment: 3 Active Problems ICD-10 Encounter Code Description Active Date MDM Diagnosis E11.621 Type 2 diabetes mellitus with foot ulcer 06/15/2021 No Yes T81.31XD Disruption of external operation (surgical) wound, not elsewhere classified, 06/15/2021 No Yes subsequent encounter E11.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 06/15/2021 No Yes L97.528 Non-pressure chronic ulcer of other part of left foot with other specified 06/15/2021 No Yes severity L97.428 Non-pressure chronic ulcer of left heel and midfoot with other specified 06/15/2021 No Yes severity Inactive Problems Resolved Problems Electronic Signature(s) Signed: 07/06/2021 4:49:53 PM By: [name] MD Entered By: [name] on 07/06/2021 11:17:09 -------------------------------------------------------------------------------- Progress Note Details Patient Name: Date of Service: [name], [name]. 07/06/2021 9:45 A M Medical Record Number: [id number] Patient Account Number: [id number] Date of Birth/Sex: Treating RN: 1940/09/25 (80 y.o. [name] Primary Care Provider: [name] Other Clinician: Referring Provider: Treating Provider/[name]: Tressie Stalker in Treatment: 3 Subjective History of Present Illness (HPI) ADMISSION 07/13/2019 Patient is a 80 year old type II diabetic. She has known PAD. She has been followed by Dr. Jacqualyn Posey of podiatry for blistering areas on her toes dating back to 04/30/2019 which at this was the left fourth toe. By 8/11 she had wounds on the right and left second toes. She underwent arterial studies by Dr. Alvester Chou on 7/31 that showed ABIs on the right of 0.60 TBI of 0.26 on the  left ABI of 0.56 and a TBI of 0.25. By 9/10 she had ischemic-looking wounds per Dr. Earleen Newport on the right first, left first second and third. She has been using Medihoney and then mupirocin more recently simply Betadine. The patient underwent angiography by Dr. Gwenlyn Found on 9/21. 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 Noted that there was 1 vessel runoff in the foot via the peroneal. ooOn the left there was an 80% left SFA, 70% mid left SFA. There was a short segment calcified CTO distal less than SFA and above-knee popliteal artery reconstituting with two-vessel runoff. 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 It was felt that she had bilateral SFA disease as well as tibial vessel disease. An attempt was made to revascularize the left SFA but they were unable to cross because of the highly calcified nature of the lesion. 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 She has an area on the medial left fourth toe with raised horned callus on top of this. I am not certain what this represents. With regards to pain she has about a 2-1/2 I will  claudication tolerance in the grocery store. She has some pain in night which is relieved by putting her feet down over the side of the bed. She is wearing Nitro-Dur patches on the top of her right foot. Past medical history includes type 2 diabetes with secondary PAD, neoplasm of the skin, diabetic retinopathy, carotid artery stenosis, hyperlipidemia hypertension. 11/2; the last time the patient was here I spoke to Dr. Gwenlyn Found about revascularization options on the right. As I understand things currently Dr. Gwenlyn Found spoke with Dr. Trula Slade and ultimately the patient was taken to surgery on 10/27. She had a right iliofemoral endarterectomy with a bovine patch angioplasty. I think the plan now is for her to have a staged intervention on the right SFA by Dr. Alvester Chou. Per the patient's understanding Dr. Gwenlyn Found and Dr. Jacqualyn Posey are waiting to see when the gangrenous toes on the right foot can be amputated. The patient states her pain is a lot better and she is grateful for that. 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 Small area on the left fourth toe. She is using Betadine to the mummified areas on the right and Medihoney on the left 08/27/2019; the last time I saw this patient I discharged her from the clinic. She had been revascularized by Dr. Trula Slade and she had a right iliofemoral endarterectomy with a bovine angioplasty. 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 Jacqualyn Posey of podiatry on 08/07/2019. I note that she also had a intervention by Dr. Gwenlyn Found and he performed a directional atherectomy and drug-coated balloon angioplasty of the SFA and popliteal artery on the right. I am not certain of the date of Dr. Kennon Holter procedure as of the time of this dictation. She was referred back to [place] by Dr. Earleen Newport predominantly for follow-up of the left fourth toe. She still has sutures and stitches in the right TMA site. She states her  pain is a lot better. She expresses concern about the condition of the amputation site at the TMA. She is on doxycycline I think prescribed by Dr. Earleen Newport. She  is complaining of some pain at night 12/10; I spoke to Dr. Jacqualyn Posey last week. He removed the sutures on the right foot on Monday of this week. She has the area on the left fourth toe just proximal to the PIP and then the right TMA site. She is still on doxycycline and has enough through next week. Unfortunately the TMA site does not look good at all. Both on the lateral and medial part of the incisions are areas that probe to bone. There is purulence over the medial part which I have cultured. We will use silver alginate. Left fourth toe looks somewhat better but there was still exposed bone 12/17; patient has an MRI booked for 12/30. Culture I did last week showed Pseudomonas Serratia and Enterococcus. This was purulent drainage coming out of the medial part of her amputation site. I use cefdinir 300 twice daily for 10 days that started on 12/15. Her x-ray on the right showed limited evaluation for osteomyelitis. The findings could have been postoperative. There was subtle erosion in the distal first and distal fifth metatarsal. An MRI was suggested. On the left she had irregularity of the fourth middle and proximal phalanx consistent with a history of osteomyelitis. I do not know that she has a history of osteomyelitis in this area. She had a newly defined area on the plantar third toe 12/31; patient's MRI is listed below: MRI OF THE RIGHT FOREFOOT WITHOUT AND WITH CONTRAST TECHNIQUE: Multiplanar, multisequence MR imaging of the right forefoot was performed before and after the administration of intravenous contrast. CONTRAST 6 mL GADAVIST IV SOLN : COMPARISON: Plain films right foot 09/04/2019. FINDINGS: Bones/Joint/Cartilage The patient is status post transmetatarsal amputation as seen on the prior exam. Marrow edema and enhancement  are seen in all of the distal metatarsals. In the first metatarsal, signal change extends 1.5 cm proximal to the stump and in the second metatarsal extends approximately 2 cm proximal to the stump. Edema and enhancement are seen in only the distal 0.7 cm of the third metatarsal stump and tips of the fourth and fifth metatarsals. Bone marrow signal is otherwise unremarkable. A small focus of subchondral edema is seen in the lateral talus, likely degenerative. Ligaments Intact. Muscles and Tendons No intramuscular fluid collection. Soft tissues Skin ulceration is seen off the stump of the first metatarsal. A thin fluid collection tracks deep to the wound and over the anterior metatarsals worrisome for small abscess. Intense subcutaneous edema and enhancement are seen diffusely. 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 Cellulitis about the foot. Skin ulceration over the distal first metatarsal is identified with a thin fluid collection tracking anteriorly along the stump worrisome for abscess. Small focus of subchondral edema in the lateral talus is likely degenerative. Electronically Signed By: Inge Rise M.D. On: 09/23/2019 15:25 Patient arrives in clinic today not complaining any of any pain. 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 She does not describe claudication however her activity is very limited. 10/01/2019; since the patient was last here I have communicated with Dr. Gwenlyn Found after bypass by Dr. Trula Slade and addressing the right superficial femoral artery he states that she is widely patent through peroneal arteries to the ankle with collaterals to the dorsalis pedis. He states he is going to talk to colleagues about the feasibility of tibial pedal  access. The patient seems infectious disease later this afternoon Dr. Baxter Flattery. in preparation for this she has been off antibiotics for 1 week and I went ahead and obtain pieces of the remanence of her first metatarsal for pathology and CandS. The patient is a candidate for hyperbaric oxygen. 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 1/15; considerable improvement in the wounds on her feet. We are using silver collagen. She follows with Dr. Baxter Flattery of infectious disease and is on meropenem and daptomycin. She has been taught how to do this herself at home. Follows with Dr. Graylon Good at the end of treatment here. She has 2 wounds on the surgical TMA site 1 lateral and 1 medial the lateral 1 has regressed tremendously. The area medially still has some exposed bone although the base of this looks healthy. 1/22; 2 separate wounds on the surgical TMA site. Both of these look satisfactory. The medial area does not have any exposed bone. This is an improvement On the left side fourth toe dorsally over the proximal phalanx there is a deep punched out area that probes to bone. She has an area on the tip of the left third toe. She also tells [place] that in HBO she traumatized her left Achilles and this is left her with a superficial wound area 1/28; weekly visit along with HBO. She has 5 wounds. T our punched out areas on the original TMA site on the right. Both of these appear to have contracted. o The area on the right no longer has exposed bone. She has an area on the tip of the left third toe and on the DIP of the left fourth toe. Both of these had surface callus that I removed and unfortunately they have small areas that both go to bone. She has a traumatic wound on the left Achilles area that happened in HBO and that appears better. She is tolerating her IV antibiotics well at home. She has home health changing the dressings and she is doing it once on the weekends. We  have been using silver collagen. She has been extensively revascularized on the right by Dr. Trula Slade and subsequently by Dr. Gwenlyn Found. 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 Baxter Flattery of infectious disease on 2/17. Dr. Gwenlyn Found on 2/23 and according to her her antibiotics stopped on 2/23. She is tolerating hyperbarics well. She has made a tremendous improvement in the right forefoot with only 2 smaller open wounds. Using silver collagen. On the left foot she has the area on the tip of the left third toe and the medial part of the left fourth toe. These had exposed bone last week I did not sense any of that today 2/12; sees Dr. Graylon Good on 2/17 and Dr. Gwenlyn Found on 2/23. We are going to use Dermagraft on this today however the lateral part of her train TMA incision on the right is healed and the medial part is down so much that we just continue with the silver collagen She has wounds on the tip of her left third and the medial aspect of the left fourth. Both of these still have exposed bone I have not been able to get these to epithelialize. 2/19; she sees Dr. Gwenlyn Found on 2/23 and I have communicated with him about the left vascular supply. Looking at her  angiogram from 08/03/2019 it looks as though that they could not cross the left CFA. Noted that she had one-vessel runoff bilaterally. She also apparently saw Dr. Graylon Good although I did not look up these results for preparation of this record. 2/26; Dr. Gwenlyn Found is going to do an angiogram next week on Wednesday. I think they are going to try to go at this both retrograde and anterograde to see if anything can be done to revascularize the left lower limb. She continues to make nice progress on the remaining wound on the right medial foot on her TMA site and the toe it wounds on the left are responding nicely as well 3/12; the patient  underwent an extensive and complicated revascularization on the left leg by Dr. Fletcher Anon on 3/3; she had an atherectomy on occluded left popliteal artery; anterior tibial artery followed by a balloon angioplasty. Atherectomy was also performed to the left SFA because there was still significant 50% stenosis in the left popliteal artery they performed an intravascular lithotripsy which improved the residual stenosis to 20%. The same lithotripsy was used to dilate the proximal left SFA. She had a drug-eluting stent placed I believe in the SFA. The patient returned for hyperbarics this week. She has had some eye problems on the right which she tells me are secondary to diabetic retinopathy and she saw her eye doctor. She is really made excellent progress there is no open wounds on the left foot at all. She has one open area medially and her TMA site on the right lateral wound is closed. 3/19; the patient comes in today with the area on the medial transmet on the right improved. Some debris removed from the surface revealed the still open wound. ooUnfortunately she now has the open area on the third toe tip and the medial aspect of the dorsal fourth toe. These are in the same location as her previous wounds. These had actually closed up last week. 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 She is undergone complex revascularizations on the right by Dr. Trula Slade and Dr. Gwenlyn Found and I believe Dr. 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 She completed IVantibiotics as directed by infectious disease and I believe a course of oral antibiotics as well.she underwent a complete 40 treatments of hyperbarics. She comes in today  with the vast majority of the right TMA site healed there is a small opening on the most medial aspect. Her left third and left fourth toes appear to have epithelialization although this has fluctuated somewhat. 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 She had a gaping wound on the right transmetatarsal amputation L as well as deep wounds on the left third and left fourth toes. She underwent complex revascularizations on her bilateral lower legs which I described on her last visit. She completed IV antibiotics by infectious disease and 40 treatments of hyperbarics. Her left third and fourth toes are healed. The area on the right transmetatarsal amputation site is also closed today. READMISSION 08/04/2020 [name] is now a 80 year old woman that we had for a complex stay in our clinic from October 2000 14 January 2020. She is a type II diabetic with PAD. She has undergone a right  transmetatarsal amputation. 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 Her current problems with wounds began on October 30 when she developed a spontaneous opening over the right transmet medial aspect over the first metatarsal head. She has been using collagen on this that she had leftover from her last stay in this clinic. She also has had an open area on the medial right calf from a vein harvest site from her CABG she. She said that this is never really closed since the vein harvest was done. With regards to her arterial status this is a major issue. She had an angiogram by Dr. Gwenlyn Found in September 2020. She developed a gangrenous right first and second toes. Ultimately she was referred to Dr. Trula Slade who did a right common femoral endarterectomy with patch angioplasty. This was on 07/21/2019 and she really had a good result. Dr. Gwenlyn Found did a atherectomy followed by a drug coated balloon angioplasty of her right SFA popliteal and tibial peroneal trunk  on 08/03/2019 again with excellent excellent results. Follow-up.Dopplers in November revealed revealed a widely patent SFA and tibioperoneal trunk. UNFORTUNATELY her recent angiogram that was done on 08/01/2020 showed an 80% right common femoral artery stenosis just above the previous endarterectomy site occluded right right SFA at the origin reconstituting the adductor canal by the profunda femoris collaterals. The profunda femoris is also diffusely diseased. There is and again another 80% tibioperoneal trunk stenosis and one-vessel runoff via the peroneal artery. She is seen Dr. Donzetta Matters and she is scheduled for a femoral endarterectomy and right femoral-popliteal bypass grafting with endarterectomy of her tibial peroneal trunk. The scheduled surgery is on 11/23 The patient does not complain of pain at rest. She does have 5-minute walking claudication. The wounds on her right first met head remanent was spontaneous she does not think she hit this. As mentioned in the area on her right medial calf is a vein harvest site Dr. Gwenlyn Found had texted me about getting her in the clinic and we have arranged this as soon as we can. I have told her that we will have to see how successful Dr. Donzetta Matters is before we know what can be done about the wounds in a precise fashion. Fortunately there does not seem to be any obvious infection involving the right foot although that may need to be looked at if things really deteriorate. She was treated with IV antibiotics and hyperbaric oxygen for her previous osteomyelitis 12/2; the patient underwent a right femoral endarterectomy and bypass femoral to popliteal artery. This was done on 08/16/2020. She is done remarkably well. She has a small area on the right anterior tibia and a small area in the medial part of her original right transmetatarsal amputation. She appears to have had an excellent response to surgery. 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 She was extensively revascularized on the right at that time as noted above in our previous notes ended up receiving IV antibiotics and hyperbaric oxygen therapy. Miraculously  this TMA site actually closed and she really has had not any trouble with this since. UNFORTUNATELY the same cannot be said of her left side. Her problem apparently started sometime in July when she hit her left third toe on a barstool while playing pool. She developed an acute infection which eventually led to underlying osteomyelitis. She underwent an extensive hospitalization from 04/27/2021 through 05/15/2021. She received IV antibiotics. Also extensive intervention by Dr. Stanford Breed of vein and vascular which included a left common femoral to tibioperoneal trunk bypass as well as a left third toe amputation on 05/03/2021. Unfortunately the area did not heal she ended up requiring a left TMA. By her description this was left open. She has been using a wound VAC on this to earlier this week and then was switched to Santyl. I think because of the left forefoot off loader she is also developed an area on the left posterior calcaneus. She is not complaining of a lot of pain. She has her daughter at home to change the dressings. She has been prescribed Santyl and she has a tube that she is brought into our clinic. I cannot get a lot of history of claudication although she is really not that mobile at this point walking with a walker. 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 She has had multiple interventions on the right side by vein and vascular and also by Dr. Gwenlyn Found as well as a transmetatarsal amputation on the right. She has had follow-up noninvasive studies on 06/06/2021; on the left that showed an ABI of 0.47 with monophasic waveforms a dorsalis pedis ABI of 0.40 with monophasic waveforms. Her ANGIOGRAM which was initially done by Dr. Oneida Alar on 04/28/2021 showed severe left foot superficial femoral artery occlusive disease and an occlusion of the left popliteal artery with two-vessel runoff to the left foot with moderate to severe disease below the knee popliteal artery and tibial disease. She had 80% distal right external iliac artery above the existing femoral above-knee popliteal bypass but she does not have an open wound on the right foot She arrives in clinic with an extensive open area on the TMA site which in the mid aspect extends into the plantar foot. She also has a more superficial area on the upper part of her Achilles area of the left heel 9/29; patient arrives in clinic today with slough over the TMA site. We use Molecu[employer] to look at the surface of this suggesting cyan and white discoloration over a large part of this wound also extending into the third metatarsal area. She also has the area on the left posterior calcaneus. This is slough covered we will switch to [facility] here as well. There is some warmth in the area and some erythema we will give her antibiotics as well 10/6; completely necrotic surface once again on the left TMA site. We have been using Santyl and Hydrofera Blue. She has an area on the left posterior Achilles and actually states this has been more painful this week indeed there is some erythema around this area. She also describes I think claudication at rest which is relieved by dipping her left leg over the side of the bed at night. 10/13; our  intake nurse was able to brush some slough off the surface of the left TMA hence this did not require debridement that is better than last  week. The area heading towards the plantar part of her foot required an aggressive debridement to clean this up. This is quite a sizable divot but does not go to bone. The area on the right Achilles heel is also covered in a high 100% nonviable tissue we have been using Santyl and Hydrofera Blue Objective Constitutional Sitting or standing Blood Pressure is within target range for patient.. Pulse regular and within target range for patient.Marland Kitchen Respirations regular, non-labored and within target range.Marland Kitchen Appears in no distress. Vitals Time Taken: 10:05 AM, Height: 64 in, Source: Stated, Weight: 135 lbs, Source: Stated, BMI: 23.2, Temperature: 97.9 F, Pulse: 84 bpm, Respiratory Rate: 18 breaths/min, Blood Pressure: 133/69 mmHg, Capillary Blood Glucose: 72 mg/dl. General Notes: glucose per pt report this am, hgb A1C 5.8 on Monday General Notes: Wound exam; substantial wound over the left TMA this actually looks better than last week no debridement however the area coming off the mid part of this wound required a substantial debridement. This is a difficult divot. I remove necrotic subcutaneous tissue oo The area on the right posterior Achilles also required debridement with the 100% adherent surface. Integumentary (Hair, Skin) Wound #13 status is Open. Original cause of wound was Surgical Injury. The date acquired was: 05/11/2021. The wound has been in treatment 3 weeks. The wound is located on the Left Amputation Site - Transmetatarsal. The wound measures 5.5cm length x 6.8cm width x 0.8cm depth; 29.374cm^2 area and 23.499cm^3 volume. There is Fat Layer (Subcutaneous Tissue) exposed. There is no tunneling or undermining noted. There is a medium amount of purulent drainage noted. The wound margin is distinct with the outline attached to the wound base. There is large  (67-100%) red granulation within the wound bed. There is a small (1-33%) amount of necrotic tissue within the wound bed including Adherent Slough. Wound #14 status is Open. Original cause of wound was Pressure Injury. The date acquired was: 05/11/2021. The wound has been in treatment 3 weeks. The wound is located on the Left,Posterior Calcaneus. The wound measures 1.3cm length x 1.3cm width x 0.1cm depth; 1.327cm^2 area and 0.133cm^3 volume. There is Fat Layer (Subcutaneous Tissue) exposed. There is no tunneling or undermining noted. There is a medium amount of serous drainage noted. The wound margin is flat and intact. There is small (1-33%) pink granulation within the wound bed. There is a large (67-100%) amount of necrotic tissue within the wound bed including Adherent Slough. 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 There was a Excisional Skin/Subcutaneous Tissue Debridement with a total area of 2.4 sq cm performed by Ricard Dillon., MD. With the following instrument(s): Curette to remove Viable and Non-Viable tissue/material. Material removed includes Subcutaneous Tissue and Slough and after achieving pain control using Other (benzocaine 20% spray). A time out was conducted at 10:55, prior to the start of the procedure. A Minimum amount of bleeding was controlled with Pressure. The procedure was  tolerated well with a pain level of 2 throughout and a pain level of 0  following the procedure. Post Debridement Measurements: 5.5cm length x 6.8cm width x 0.8cm depth; 23.499cm^3 volume. Character of Wound/Ulcer Post Debridement is improved. Severity of Tissue Post Debridement is: Fat layer exposed. Post procedure Diagnosis Wound #13: Same as Pre-Procedure Wound #14 Pre-procedure diagnosis of Wound #14 is a Diabetic Wound/Ulcer of the Lower Extremity located on the Left,Posterior Calcaneus .Severity of Tissue Pre Debridement is: Fat layer exposed. There was a Excisional Skin/Subcutaneous Tissue Debridement with a total area of 1.69 sq cm performed by Ricard Dillon., MD. With the following instrument(s): Curette to remove Viable and Non-Viable tissue/material. Material removed includes Subcutaneous Tissue and Slough and after achieving pain control using Other (benzocaine 20% spray). A time out was conducted at 10:55, prior to the start of the procedure. A Minimum amount of bleeding was controlled with Pressure. The procedure was tolerated well with a pain level of 2 throughout and a pain level of 0 following the procedure. Post Debridement Measurements: 1.3cm length x 1.3cm width x 0.1cm depth; 0.133cm^3 volume. Character of Wound/Ulcer Post Debridement is improved. Severity of Tissue Post Debridement is: Fat layer exposed. Post procedure Diagnosis Wound #14: Same as Pre-Procedure Plan Follow-up Appointments: Return Appointment in 1 week. - with Dr. Dellia Nims Cellular or Tissue Based Products: Cellular or Tissue Based Product Type: - run IVR for apligraf Bathing/ Shower/ Hygiene: May shower and wash wound with soap and water. Off-Loading: Open toe surgical [name] to: - right foot Wedge [name] to: - left foot Home Health: No change in wound care orders this week; continue Home Health for wound care. May utilize formulary equivalent dressing for wound treatment orders unless otherwise  specified. Other Home Health Orders/Instructions: - [facility] 3x a week WOUND #13: - Amputation Site - Transmetatarsal Wound Laterality: Left Cleanser: Soap and Water 1 x Per Day/7 Days Discharge Instructions: May shower and wash wound with dial antibacterial soap and water prior to dressing change. Cleanser: Wound Cleanser (Home Health) 1 x Per Day/7 Days Discharge Instructions: Cleanse the wound with wound cleanser or normal saline prior to applying a clean dressing using gauze sponges, not tissue or cotton balls. Prim Dressing: KerraCel Ag Gelling Fiber Dressing, 4x5 in (silver alginate) (Home Health) 1 x Per Day/7 Days ary Discharge Instructions: Apply silver alginate over the santyl. Prim Dressing: Santyl Ointment 1 x Per Day/7 Days ary Discharge Instructions: Apply nickel thick amount to wound bed as instructed Secondary Dressing: Woven Gauze Sponge, Non-Sterile 4x4 in (Home Health) 1 x Per Day/7 Days Discharge Instructions: Apply over primary dressing as directed. Secondary Dressing: ABD Pad, 5x9 (Home Health) 1 x Per Day/7 Days Discharge Instructions: Apply over primary dressing as directed. Secured With: [employer], 4.5x3.1 (in/yd) (Home Health) 1 x Per Day/7 Days Discharge Instructions: Secure with Kerlix as directed. Secured With: Paper T ape, 2x10 (in/yd) (Home Health) 1 x Per Day/7 Days Discharge Instructions: Secure dressing with tape as directed. WOUND #14: - Calcaneus Wound Laterality: Left, Posterior Cleanser: Soap and Water 1 x Per Day/7 Days Discharge Instructions: May shower and wash wound with dial antibacterial soap and water prior to dressing change. Cleanser: Wound Cleanser (Home Health) 1 x Per Day/7 Days Discharge Instructions: Cleanse the wound with wound cleanser or normal saline prior to applying a clean dressing using gauze sponges, not tissue or cotton balls. Prim Dressing: KerraCel Ag Gelling Fiber Dressing, 4x5 in (silver alginate) (Home Health) 1 x  Per Day/7 Days ary Discharge Instructions: Apply silver alginate over santyl. Prim Dressing: Santyl Ointment 1 x Per  Day/7 Days ary Discharge Instructions: Apply nickel thick amount to wound bed as instructed Secondary Dressing: Woven Gauze Sponge, Non-Sterile 4x4 in (Home Health) 1 x Per Day/7 Days Discharge Instructions: Apply over primary dressing as directed. Secondary Dressing: ALLEVYN Heel 4 1/2in x 5 1/2in / 10.5cm x 13.5cm (Home Health) 1 x Per Day/7 Days Discharge Instructions: Apply over primary dressing as directed. Secured With: [employer], 4.5x3.1 (in/yd) (Home Health) 1 x Per Day/7 Days Discharge Instructions: Secure with Kerlix as directed. Secured With: Paper T ape, 2x10 (in/yd) (Home Health) 1 x Per Day/7 Days Discharge Instructions: Secure dressing with tape as directed. #1 we continued with Santyl and silver alginate. I continue to debride these areas to get to a surface that is viable. 2. She sees vein and vascular on 10/25 Dr. Stanford Breed. I do not expect there to be any additional interventions therefore I am expecting to have to operate on these wounds without any additional blood flow. I am not sure exactly where this leaves [place] however I will run Apligraf through her insurance. Electronic Signature(s) Signed: 07/06/2021 4:49:53 PM By: [name] MD Entered By: [name] on 07/06/2021 11:22:21 -------------------------------------------------------------------------------- SuperBill Details Patient Name: Date of Service: [name], [name] 07/06/2021 Medical Record Number: [id number] Patient Account Number: [id number] Date of Birth/Sex: Treating RN: 02/14/1941 (80 y.o. 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 Facility Procedures CPT4 Code: [id number] Description: 54627 - DEB SUBQ TISSUE 20 SQ CM/< ICD-10 Diagnosis Description L97.528 Non-pressure chronic ulcer of other part of left foot with other specified seve L97.428 Non-pressure chronic ulcer of left heel and midfoot with other specified severi Modifier: rity ty Quantity: 1 Physician Procedures : CPT4 Code Description Modifier [id number] 11042 - WC PHYS SUBQ TISS 20 SQ CM ICD-10 Diagnosis Description L97.528 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ity L97.428 Non-pressure chronic ulcer of left heel and  midfoot with other specified severity Quantity: 1 Electronic Signature(s) Signed: 07/06/2021 4:49:53 PM By: [name] MD Entered By: [name] on 07/06/2021 11:23:13

## 2021-07-07 DIAGNOSIS — H02831 Dermatochalasis of right upper eyelid: Secondary | ICD-10-CM | POA: Diagnosis not present

## 2021-07-07 DIAGNOSIS — L03116 Cellulitis of left lower limb: Secondary | ICD-10-CM | POA: Diagnosis not present

## 2021-07-07 DIAGNOSIS — Z961 Presence of intraocular lens: Secondary | ICD-10-CM | POA: Diagnosis not present

## 2021-07-07 DIAGNOSIS — M86172 Other acute osteomyelitis, left ankle and foot: Secondary | ICD-10-CM | POA: Diagnosis not present

## 2021-07-07 DIAGNOSIS — H02834 Dermatochalasis of left upper eyelid: Secondary | ICD-10-CM | POA: Diagnosis not present

## 2021-07-07 DIAGNOSIS — H0102A Squamous blepharitis right eye, upper and lower eyelids: Secondary | ICD-10-CM | POA: Diagnosis not present

## 2021-07-07 DIAGNOSIS — I70202 Unspecified atherosclerosis of native arteries of extremities, left leg: Secondary | ICD-10-CM | POA: Diagnosis not present

## 2021-07-07 DIAGNOSIS — H47011 Ischemic optic neuropathy, right eye: Secondary | ICD-10-CM | POA: Diagnosis not present

## 2021-07-07 DIAGNOSIS — E11628 Type 2 diabetes mellitus with other skin complications: Secondary | ICD-10-CM | POA: Diagnosis not present

## 2021-07-07 DIAGNOSIS — D638 Anemia in other chronic diseases classified elsewhere: Secondary | ICD-10-CM | POA: Diagnosis not present

## 2021-07-07 DIAGNOSIS — I10 Essential (primary) hypertension: Secondary | ICD-10-CM | POA: Diagnosis not present

## 2021-07-07 DIAGNOSIS — H0102B Squamous blepharitis left eye, upper and lower eyelids: Secondary | ICD-10-CM | POA: Diagnosis not present

## 2021-07-10 ENCOUNTER — Other Ambulatory Visit: Payer: Self-pay | Admitting: Adult Health

## 2021-07-10 ENCOUNTER — Other Ambulatory Visit: Payer: Self-pay | Admitting: Vascular Surgery

## 2021-07-10 ENCOUNTER — Other Ambulatory Visit: Payer: Self-pay | Admitting: Endocrinology

## 2021-07-10 DIAGNOSIS — I10 Essential (primary) hypertension: Secondary | ICD-10-CM | POA: Diagnosis not present

## 2021-07-10 DIAGNOSIS — I70202 Unspecified atherosclerosis of native arteries of extremities, left leg: Secondary | ICD-10-CM | POA: Diagnosis not present

## 2021-07-10 DIAGNOSIS — D638 Anemia in other chronic diseases classified elsewhere: Secondary | ICD-10-CM | POA: Diagnosis not present

## 2021-07-10 DIAGNOSIS — L03116 Cellulitis of left lower limb: Secondary | ICD-10-CM | POA: Diagnosis not present

## 2021-07-10 DIAGNOSIS — M86172 Other acute osteomyelitis, left ankle and foot: Secondary | ICD-10-CM | POA: Diagnosis not present

## 2021-07-10 DIAGNOSIS — E11628 Type 2 diabetes mellitus with other skin complications: Secondary | ICD-10-CM | POA: Diagnosis not present

## 2021-07-12 DIAGNOSIS — I10 Essential (primary) hypertension: Secondary | ICD-10-CM | POA: Diagnosis not present

## 2021-07-12 DIAGNOSIS — E11628 Type 2 diabetes mellitus with other skin complications: Secondary | ICD-10-CM | POA: Diagnosis not present

## 2021-07-12 DIAGNOSIS — D638 Anemia in other chronic diseases classified elsewhere: Secondary | ICD-10-CM | POA: Diagnosis not present

## 2021-07-12 DIAGNOSIS — I70202 Unspecified atherosclerosis of native arteries of extremities, left leg: Secondary | ICD-10-CM | POA: Diagnosis not present

## 2021-07-12 DIAGNOSIS — M86172 Other acute osteomyelitis, left ankle and foot: Secondary | ICD-10-CM | POA: Diagnosis not present

## 2021-07-12 DIAGNOSIS — L03116 Cellulitis of left lower limb: Secondary | ICD-10-CM | POA: Diagnosis not present

## 2021-07-13 ENCOUNTER — Other Ambulatory Visit: Payer: Self-pay

## 2021-07-13 ENCOUNTER — Encounter (HOSPITAL_BASED_OUTPATIENT_CLINIC_OR_DEPARTMENT_OTHER): Payer: Medicare Other | Admitting: Internal Medicine

## 2021-07-13 DIAGNOSIS — L97529 Non-pressure chronic ulcer of other part of left foot with unspecified severity: Secondary | ICD-10-CM | POA: Diagnosis not present

## 2021-07-13 DIAGNOSIS — L97422 Non-pressure chronic ulcer of left heel and midfoot with fat layer exposed: Secondary | ICD-10-CM | POA: Diagnosis not present

## 2021-07-13 DIAGNOSIS — L97429 Non-pressure chronic ulcer of left heel and midfoot with unspecified severity: Secondary | ICD-10-CM | POA: Diagnosis not present

## 2021-07-13 DIAGNOSIS — E11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: Secondary | ICD-10-CM | POA: Diagnosis not present

## 2021-07-13 DIAGNOSIS — I2510 Atherosclerotic heart disease of native coronary artery without angina pectoris: Secondary | ICD-10-CM | POA: Diagnosis not present

## 2021-07-13 DIAGNOSIS — I10 Essential (primary) hypertension: Secondary | ICD-10-CM | POA: Diagnosis not present

## 2021-07-13 DIAGNOSIS — E11621 Type 2 diabetes mellitus with foot ulcer: Secondary | ICD-10-CM | POA: Diagnosis not present

## 2021-07-13 NOTE — Progress Notes (Signed)
[name], [place] ([id number]) Visit Report for 07/13/2021 Debridement Details Patient Name: Date of Service: [name] 07/13/2021 8:30 A M Medical Record Number: [id number] Patient Account Number: [id number] Date of Birth/Sex: Treating RN: 1941-04-13 (80 y.o. [name], [name]: [name] Other Clinician: Referring Provider: Treating Provider/[name]: Tressie Stalker in Treatment: 4 Debridement Performed for Assessment: Wound #13 Left Amputation Site - Transmetatarsal Performed By: Clinician Deon Pilling, RN Debridement Type: Chemical/Enzymatic/Mechanical Agent Used: Santyl Severity of Tissue Pre Debridement: Fat layer exposed Level of Consciousness (Pre-procedure): Awake and Alert Pre-procedure Verification/Time Out No Taken: Bleeding: None Response to Treatment: Procedure was tolerated well Level of Consciousness (Post- Awake and Alert procedure): Post Debridement Measurements of Total Wound Length: (cm) 4.5 Width: (cm) 6.6 Depth: (cm) 0.5 Volume: (cm) 11.663 Character of Wound/Ulcer Post Debridement: Requires Further Debridement Severity of Tissue Post Debridement: Fat layer exposed Post Procedure Diagnosis Same as Pre-procedure Electronic Signature(s) Signed: 07/13/2021 5:12:42 PM By: [name] MD Signed: 07/13/2021 5:40:08 PM By: Deon Pilling RN, BSN Entered By: Deon Pilling on 07/13/2021 08:53:18 -------------------------------------------------------------------------------- Debridement Details Patient Name: Date of Service: [name], [name] LLY H. 07/13/2021 8:30 A M Medical Record Number: [id number] Patient Account Number: [id number] Date of Birth/Sex: Treating RN: 1940-10-09 (80 y.o. [name], [name]: [name] Other Clinician: Referring Provider: Treating Provider/[name]: Tressie Stalker in Treatment: 4 Debridement Performed for  Assessment: Wound #14 Left,Posterior Calcaneus Performed By: Physician Ricard Dillon., MD Debridement Type: Debridement Severity of Tissue Pre Debridement: Fat layer exposed Level of Consciousness (Pre-procedure): Awake and Alert Pre-procedure Verification/Time Out Yes - 08:48 Taken: Start Time: 08:49 Pain Control: Other : Benzocaine 20% T Area Debrided (L x W): otal 0.5 (cm) x 1.3 (cm) = 0.65 (cm) Tissue and other material debrided: Viable, Non-Viable, Slough, Subcutaneous, Skin: Dermis , Skin: Epidermis, Fibrin/Exudate, Slough Level: Skin/Subcutaneous Tissue Debridement Description: Excisional Instrument: Curette Bleeding: Minimum Hemostasis Achieved: Pressure End Time: 08:52 Procedural Pain: 0 Post Procedural Pain: 0 Response to Treatment: Procedure was tolerated well Level of Consciousness (Post- Awake and Alert procedure): Post Debridement Measurements of Total Wound Length: (cm) 0.5 Width: (cm) 1.3 Depth: (cm) 0.2 Volume: (cm) 0.102 Character of Wound/Ulcer Post Debridement: Requires Further Debridement Severity of Tissue Post Debridement: Fat layer exposed Post Procedure Diagnosis Same as Pre-procedure Electronic Signature(s) Signed: 07/13/2021 5:12:42 PM By: [name] MD Signed: 07/13/2021 5:40:08 PM By: Deon Pilling RN, BSN Entered By: [name] on 07/13/2021 08:58:28 -------------------------------------------------------------------------------- HPI Details Patient Name: Date of Service: [name], [name] LLY H. 07/13/2021 8:30 A M Medical Record Number: [id number] Patient Account Number: [id number] Date of Birth/Sex: Treating RN: [birth year] (80 y.o. [name] Primary Care Provider: [name] Other Clinician: Referring Provider: Treating Provider/[name]: Tressie Stalker in Treatment: 4 History of Present Illness HPI Description: ADMISSION 07/13/2019 Patient is a 80 year old type II diabetic. She has  known PAD. She has been followed by Dr. Jacqualyn Posey of podiatry for blistering areas on her toes dating back to 04/30/2019 which at this was the left fourth toe. By 8/11 she had wounds on the right and left second toes. She underwent arterial studies by Dr. Alvester Chou on 7/31 that showed ABIs on the right of 0.60 TBI of 0.26 on the left ABI of 0.56 and a TBI of 0.25. By 9/10 she had ischemic-looking wounds per Dr. Earleen Newport on the right first, left first second and third. She has been using Medihoney and then mupirocin more  recently simply Betadine. The patient underwent angiography by Dr. Gwenlyn Found on 9/21. 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 Noted that there was 1 vessel runoff in the foot via the peroneal. On the left there was an 80% left SFA, 70% mid left SFA. There was a short segment calcified CTO distal less than SFA and above-knee popliteal artery reconstituting with two-vessel runoff. 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 It was felt that she had bilateral SFA disease as well as tibial vessel disease. An attempt was made to revascularize the left SFA but they were unable to cross because of the highly calcified nature of the lesion. 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 She has an area on the medial left fourth toe with raised horned callus on top of this. I am not certain what this represents. With regards to pain she has about a 2-1/2 I will claudication tolerance in the grocery store. She has some pain in night which is relieved by putting her feet down over the side of the bed. She is wearing Nitro-Dur patches on the top of her right foot. Past medical history includes type 2 diabetes with secondary PAD, neoplasm of the skin, diabetic retinopathy,  carotid artery stenosis, hyperlipidemia hypertension. 11/2; the last time the patient was here I spoke to Dr. Gwenlyn Found about revascularization options on the right. As I understand things currently Dr. Gwenlyn Found spoke with Dr. Trula Slade and ultimately the patient was taken to surgery on 10/27. She had a right iliofemoral endarterectomy with a bovine patch angioplasty. I think the plan now is for her to have a staged intervention on the right SFA by Dr. Alvester Chou. Per the patient's understanding Dr. Gwenlyn Found and Dr. Jacqualyn Posey are waiting to see when the gangrenous toes on the right foot can be amputated. The patient states her pain is a lot better and she is grateful for that. 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 Small area on the left fourth toe. She is using Betadine to the mummified areas on the right and Medihoney on the left 08/27/2019; the last time I saw this patient I discharged her from the clinic. She had been revascularized by Dr. Trula Slade and she had a right iliofemoral endarterectomy with a bovine angioplasty. 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 Jacqualyn Posey of podiatry on 08/07/2019. I note that she also had a intervention by Dr. Gwenlyn Found and he performed a directional atherectomy and drug-coated balloon angioplasty of the SFA and popliteal artery on the right. I am not certain of the date of Dr. Kennon Holter procedure as of the time of this dictation. She was referred back to [place] by Dr. Earleen Newport predominantly for follow-up of the left fourth toe. She still has sutures and stitches in the right TMA site. She states her pain is a lot better. She expresses concern about the condition of the amputation site at the TMA. She is on doxycycline I think prescribed by Dr. Earleen Newport. She is complaining of some pain at night 12/10; I spoke to Dr. Jacqualyn Posey last week. He removed the sutures on the right foot on Monday of this week. She has the  area on the left fourth  toe just proximal to the PIP and then the right TMA site. She is still on doxycycline and has enough through next week. Unfortunately the TMA site does not look good at all. Both on the lateral and medial part of the incisions are areas that probe to bone. There is purulence over the medial part which I have cultured. We will use silver alginate. Left fourth toe looks somewhat better but there was still exposed bone 12/17; patient has an MRI booked for 12/30. Culture I did last week showed Pseudomonas Serratia and Enterococcus. This was purulent drainage coming out of the medial part of her amputation site. I use cefdinir 300 twice daily for 10 days that started on 12/15. Her x-ray on the right showed limited evaluation for osteomyelitis. The findings could have been postoperative. There was subtle erosion in the distal first and distal fifth metatarsal. An MRI was suggested. On the left she had irregularity of the fourth middle and proximal phalanx consistent with a history of osteomyelitis. I do not know that she has a history of osteomyelitis in this area. She had a newly defined area on the plantar third toe 12/31; patient's MRI is listed below: MRI OF THE RIGHT FOREFOOT WITHOUT AND WITH CONTRAST TECHNIQUE: Multiplanar, multisequence MR imaging of the right forefoot was performed before and after the administration of intravenous contrast. CONTRAST 6 mL GADAVIST IV SOLN : COMPARISON: Plain films right foot 09/04/2019. FINDINGS: Bones/Joint/Cartilage The patient is status post transmetatarsal amputation as seen on the prior exam. Marrow edema and enhancement are seen in all of the distal metatarsals. In the first metatarsal, signal change extends 1.5 cm proximal to the stump and in the second metatarsal extends approximately 2 cm proximal to the stump. Edema and enhancement are seen in only the distal 0.7 cm of the third metatarsal stump and tips of the fourth and  fifth metatarsals. Bone marrow signal is otherwise unremarkable. A small focus of subchondral edema is seen in the lateral talus, likely degenerative. Ligaments Intact. Muscles and Tendons No intramuscular fluid collection. Soft tissues Skin ulceration is seen off the stump of the first metatarsal. A thin fluid collection tracks deep to the wound and over the anterior metatarsals worrisome for small abscess. Intense subcutaneous edema and enhancement are seen diffusely. 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 Cellulitis about the foot. Skin ulceration over the distal first metatarsal is identified with a thin fluid collection tracking anteriorly along the stump worrisome for abscess. Small focus of subchondral edema in the lateral talus is likely degenerative. Electronically Signed By: Inge Rise M.D. On: 09/23/2019 15:25 Patient arrives in clinic today not complaining any of any pain. 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 She does not describe claudication however her activity is very limited. 10/01/2019; since the patient was last here I have communicated with Dr. Gwenlyn Found after bypass by Dr. Trula Slade and addressing the right superficial femoral artery he states that she is widely patent through peroneal arteries to the ankle with collaterals to the dorsalis pedis. He states he is going to talk to colleagues about the feasibility of tibial pedal access. The patient seems infectious disease later this afternoon Dr. Baxter Flattery. in preparation for this she has been off antibiotics for 1 week and I went ahead and obtain pieces of the remanence of her first metatarsal  for pathology and CandS. The patient is a candidate for hyperbaric oxygen. 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 1/15; considerable improvement in the wounds on her feet. We are using silver collagen. She follows with Dr. Baxter Flattery of infectious disease and is on meropenem and daptomycin. She has been taught how to do this herself at home. Follows with Dr. Graylon Good at the end of treatment here. She has 2 wounds on the surgical TMA site 1 lateral and 1 medial the lateral 1 has regressed tremendously. The area medially still has some exposed bone although the base of this looks healthy. 1/22; 2 separate wounds on the surgical TMA site. Both of these look satisfactory. The medial area does not have any exposed bone. This is an improvement On the left side fourth toe dorsally over the proximal phalanx there is a deep punched out area that probes to bone. She has an area on the tip of the left third toe. She also tells [place] that in HBO she traumatized her left Achilles and this is left her with a superficial wound area 1/28; weekly visit along with HBO. She has 5 wounds. T our punched out areas on the original TMA site on the right. Both of these appear to have contracted. o The area on the right no longer has exposed bone. She has an area on the tip of the left third toe and on the DIP of the left fourth toe. Both of these had surface callus that I removed and unfortunately they have small areas that both go to bone. She has a traumatic wound on the left Achilles area that happened in HBO and that appears better. She is tolerating her IV antibiotics well at home. She has home health changing the dressings and she is doing it once on the weekends. We have been using silver collagen. She has been extensively revascularized on the right by Dr. Trula Slade and subsequently by Dr. Gwenlyn Found. 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 Baxter Flattery of  infectious disease on 2/17. Dr. Gwenlyn Found on 2/23 and according to her her antibiotics stopped on 2/23. She is tolerating hyperbarics well. She has made a tremendous improvement in the right forefoot with only 2 smaller open wounds. Using silver collagen. On the left foot she has the area on the tip of the left third toe and the medial part of the left fourth toe. These had exposed bone last week I did not sense any of that today 2/12; sees Dr. Graylon Good on 2/17 and Dr. Gwenlyn Found on 2/23. We are going to use Dermagraft on this today however the lateral part of her train TMA incision on the right is healed and the medial part is down so much that we just continue with the silver collagen She has wounds on the tip of her left third and the medial aspect of the left fourth. Both of these still have exposed bone I have not been able to get these to epithelialize. 2/19; she sees Dr. Gwenlyn Found on 2/23 and I have communicated with him about the left vascular supply. Looking at her angiogram from 08/03/2019 it looks as though that they could not cross the left CFA. Noted that she had one-vessel runoff bilaterally. She also apparently saw Dr. Graylon Good although I did not look up these results  for preparation of this record. 2/26; Dr. Gwenlyn Found is going to do an angiogram next week on Wednesday. I think they are going to try to go at this both retrograde and anterograde to see if anything can be done to revascularize the left lower limb. She continues to make nice progress on the remaining wound on the right medial foot on her TMA site and the toe it wounds on the left are responding nicely as well 3/12; the patient underwent an extensive and complicated revascularization on the left leg by Dr. Fletcher Anon on 3/3; she had an atherectomy on occluded left popliteal artery; anterior tibial artery followed by a balloon angioplasty. Atherectomy was also performed to the left SFA because there was still significant 50% stenosis in the left  popliteal artery they performed an intravascular lithotripsy which improved the residual stenosis to 20%. The same lithotripsy was used to dilate the proximal left SFA. She had a drug-eluting stent placed I believe in the SFA. The patient returned for hyperbarics this week. She has had some eye problems on the right which she tells me are secondary to diabetic retinopathy and she saw her eye doctor. She is really made excellent progress there is no open wounds on the left foot at all. She has one open area medially and her TMA site on the right lateral wound is closed. 3/19; the patient comes in today with the area on the medial transmet on the right improved. Some debris removed from the surface revealed the still open wound. Unfortunately she now has the open area on the third toe tip and the medial aspect of the dorsal fourth toe. These are in the same location as her previous wounds. These had actually closed up last week. 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 She is undergone complex revascularizations on the right by Dr. Trula Slade and Dr. Gwenlyn Found and I believe Dr. 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 She completed IVantibiotics as directed by infectious disease and I believe a course of oral antibiotics as well.she underwent a complete 40 treatments of hyperbarics. She comes in today with the vast majority of the right TMA site healed there is a small opening on the most medial aspect. Her left third and left fourth toes appear to have epithelialization although this has fluctuated somewhat. 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 She had a gaping wound on the right transmetatarsal  amputation L as well as deep wounds on the left third and left fourth toes. She underwent complex revascularizations on her bilateral lower legs which I described on her last visit. She completed IV antibiotics by infectious disease and 40 treatments of hyperbarics. Her left third and fourth toes are healed. The area on the right transmetatarsal amputation site is also closed today. READMISSION 08/04/2020 [name] is now a 80 year old woman that we had for a complex stay in our clinic from October 2000 14 January 2020. 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 Her current problems with wounds began on October 30 when she developed a  spontaneous opening over the right transmet medial aspect over the first metatarsal head. She has been using collagen on this that she had leftover from her last stay in this clinic. She also has had an open area on the medial right calf from a vein harvest site from her CABG she. She said that this is never really closed since the vein harvest was done. With regards to her arterial status this is a major issue. She had an angiogram by Dr. Gwenlyn Found in September 2020. She developed a gangrenous right first and second toes. Ultimately she was referred to Dr. Trula Slade who did a right common femoral endarterectomy with patch angioplasty. This was on 07/21/2019 and she really had a good result. Dr. Gwenlyn Found did a atherectomy followed by a drug coated balloon angioplasty of her right SFA popliteal and tibial peroneal trunk on 08/03/2019 again with excellent excellent results. Follow-up.Dopplers in November revealed revealed a widely patent SFA and tibioperoneal trunk. UNFORTUNATELY her recent angiogram that was done on 08/01/2020 showed an 80% right common femoral artery stenosis just above the previous endarterectomy site occluded right right  SFA at the origin reconstituting the adductor canal by the profunda femoris collaterals. The profunda femoris is also diffusely diseased. There is and again another 80% tibioperoneal trunk stenosis and one-vessel runoff via the peroneal artery. She is seen Dr. Donzetta Matters and she is scheduled for a femoral endarterectomy and right femoral-popliteal bypass grafting with endarterectomy of her tibial peroneal trunk. The scheduled surgery is on 11/23 The patient does not complain of pain at rest. She does have 5-minute walking claudication. The wounds on her right first met head remanent was spontaneous she does not think she hit this. As mentioned in the area on her right medial calf is a vein harvest site Dr. Gwenlyn Found had texted me about getting her in the clinic and we have arranged this as soon as we can. I have told her that we will have to see how successful Dr. Donzetta Matters is before we know what can be done about the wounds in a precise fashion. Fortunately there does not seem to be any obvious infection involving the right foot although that may need to be looked at if things really deteriorate. She was treated with IV antibiotics and hyperbaric oxygen for her previous osteomyelitis 12/2; the patient underwent a right femoral endarterectomy and bypass femoral to popliteal artery. This was done on 08/16/2020. She is done remarkably well. She has a small area on the right anterior tibia and a small area in the medial part of her original right transmetatarsal amputation. She appears to have had an excellent response to surgery. 12/16; the patient's area on the right tibia and the small area on the medial part of her original right transmetatarsal amputation is totally healed READMISSION 06/15/2021 [name] is a now 80 year old woman who arrives for review of wounds on the left TMA site extending into her plantar foot as well as an area on the left posterior calcaneus. We had her extensively in 2021 for a failed  right TMA in the setting of type 2 diabetes with angiopathy and underlying osteomyelitis. She was extensively revascularized on the right at that time as noted above in our previous notes ended up receiving IV antibiotics and hyperbaric oxygen therapy. Miraculously this TMA site actually closed and she really has had not any trouble with this since. UNFORTUNATELY the same cannot be said of her left side. Her problem apparently started sometime in July when she hit  her left third toe on a barstool while playing pool. She developed an acute infection which eventually led to underlying osteomyelitis. She underwent an extensive hospitalization from 04/27/2021 through 05/15/2021. She received IV antibiotics. Also extensive intervention by Dr. Stanford Breed of vein and vascular which included a left common femoral to tibioperoneal trunk bypass as well as a left third toe amputation on 05/03/2021. Unfortunately the area did not heal she ended up requiring a left TMA. By her description this was left open. She has been using a wound VAC on this to earlier this week and then was switched to Santyl. I think because of the left forefoot off loader she is also developed an area on the left posterior calcaneus. She is not complaining of a lot of pain. She has her daughter at home to change the dressings. She has been prescribed Santyl and she has a tube that she is brought into our clinic. I cannot get a lot of history of claudication although she is really not that mobile at this point walking with a walker. 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 She has had multiple interventions on the right side by vein and vascular and also by Dr. Gwenlyn Found as well as a transmetatarsal amputation on the right. She has had follow-up noninvasive studies on 06/06/2021; on the left that showed an ABI of  0.47 with monophasic waveforms a dorsalis pedis ABI of 0.40 with monophasic waveforms. Her ANGIOGRAM which was initially done by Dr. Oneida Alar on 04/28/2021 showed severe left foot superficial femoral artery occlusive disease and an occlusion of the left popliteal artery with two-vessel runoff to the left foot with moderate to severe disease below the knee popliteal artery and tibial disease. She had 80% distal right external iliac artery above the existing femoral above-knee popliteal bypass but she does not have an open wound on the right foot She arrives in clinic with an extensive open area on the TMA site which in the mid aspect extends into the plantar foot. She also has a more superficial area on the upper part of her Achilles area of the left heel 9/29; patient arrives in clinic today with slough over the TMA site. We use MolecuLight to look at the surface of this suggesting cyan and white discoloration over a large part of this wound also extending into the third metatarsal area. She also has the area on the left posterior calcaneus. This is slough covered we will switch to [facility] here as well. There is some warmth in the area and some erythema we will give her antibiotics as well 10/6; completely necrotic surface once again on the left TMA site. We have been using Santyl and Hydrofera Blue. She has an area on the left posterior Achilles and actually states this has been more painful this week indeed there is some erythema around this area. She also describes I think claudication at rest which is relieved by dipping her left leg over the side of the bed at night. 10/13; our intake nurse was able to brush some slough off the surface of the left TMA hence this did not require debridement that is better than last week. The area heading towards the plantar part of her foot required an aggressive debridement to clean this up. This is quite a sizable divot but does not go to bone. The area on the right  Achilles heel is also covered in a high 100% nonviable tissue we have been using Santyl and silver alginate 10/20; the area on the left TMA looks much better. Even the area is spreading into the midfoot looks improved. The right Achilles still is covered and nonviable surface 100%. We have been using Santyl and silver alginate and this will continue for this week The patient is approved for 5 Apligraf's and I think she is good enough to start using these next week Electronic Signature(s) Signed: 07/13/2021 5:12:42 PM By: [name] MD Entered By: [name] on 07/13/2021 08:59:29 -------------------------------------------------------------------------------- Physical Exam Details Patient Name: Date of Service: [name], Adin. 07/13/2021 8:30 A M Medical Record Number: [id number] Patient Account Number: [id number] Date of Birth/Sex: Treating RN: October 18, 1940 (80 y.o. [name] Primary Care Provider: [name] Other Clinician: Referring Provider: Treating Provider/[name]: Tressie Stalker in Treatment: 4 Constitutional Patient is hypertensive.. Pulse regular and within target range for patient.Marland Kitchen Respirations regular, non-labored and within target range.. Temperature is normal and within the target range for the patient.Marland Kitchen Appears in no distress. Notes Wound exam; substantial wound over the left TMA continues to look better from a surface point of view healthy looking tissue. Even the area in the mid part of this that goes into the midfoot looks considerably better. The area on the Achilles heel still 100% surface debris I used a #3 curette to gently debride this [not a lot of tissue over bone]. Electronic Signature(s) Signed: 07/13/2021 5:12:42 PM By: [name] MD Entered By: [name] on 07/13/2021 09:01:50 -------------------------------------------------------------------------------- Physician Orders Details Patient  Name: Date of Service: [name], [name] Mourning. 07/13/2021 8:30 A M Medical Record Number: [id number] Patient Account Number: [id number] Date of Birth/Sex: Treating RN: 05/01/1941 (80 y.o. 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 - with Dr. Dellia Nims Return A Cellular or Tissue Based Products Cellular or Tissue Based Product Type: - [place] to order apligraf and to be applied next week. Bathing/ Shower/ Hygiene May shower and wash wound with soap and water. Off-Loading Open toe surgical [name] to: - right foot Wedge [name] to: - left foot Home Health No change in wound care orders this week; continue Home Health for wound care. May utilize formulary equivalent dressing for wound treatment orders unless otherwise specified. Other Home Health Orders/Instructions: - [facility] 3x a week Wound Treatment Wound #13 - Amputation Site - Transmetatarsal Wound Laterality: Left Cleanser: Soap and Water 1 x Per Day/7 Days Discharge Instructions: May shower and wash wound with dial antibacterial soap and water prior to dressing change. Cleanser: Wound Cleanser (Home Health) 1 x Per Day/7 Days Discharge Instructions: Cleanse the wound with wound cleanser or normal saline prior to applying a clean dressing using gauze sponges, not  tissue or cotton balls. Prim Dressing: KerraCel Ag Gelling Fiber Dressing, 4x5 in (silver alginate) (Home Health) 1 x Per Day/7 Days ary Discharge Instructions: Apply silver alginate over the santyl. Prim  Dressing: Santyl Ointment 1 x Per Day/7 Days ary Discharge Instructions: Apply nickel thick amount to wound bed as instructed Secondary Dressing: Woven Gauze Sponge, Non-Sterile 4x4 in (Home Health) 1 x Per Day/7 Days Discharge Instructions: Apply over primary dressing as directed. Secondary Dressing: ABD Pad, 5x9 (Home Health) 1 x Per Day/7 Days Discharge Instructions: Apply over primary dressing as directed. Secured With: [employer], 4.5x3.1 (in/yd) (Home Health) 1 x Per Day/7 Days Discharge Instructions: Secure with Kerlix as directed. Secured With: Paper Tape, 2x10 (in/yd) (Home Health) 1 x Per Day/7 Days Discharge Instructions: Secure dressing with tape as directed. Wound #14 - Calcaneus Wound Laterality: Left, Posterior Cleanser: Soap and Water 1 x Per Day/7 Days Discharge Instructions: May shower and wash wound with dial antibacterial soap and water prior to dressing change. Cleanser: Wound Cleanser (Home Health) 1 x Per Day/7 Days Discharge Instructions: Cleanse the wound with wound cleanser or normal saline prior to applying a clean dressing using gauze sponges, not tissue or cotton balls. Prim Dressing: KerraCel Ag Gelling Fiber Dressing, 4x5 in (silver alginate) (Home Health) 1 x Per Day/7 Days ary Discharge Instructions: Apply silver alginate over santyl. Prim Dressing: Santyl Ointment 1 x Per Day/7 Days ary Discharge Instructions: Apply nickel thick amount to wound bed as instructed Secondary Dressing: Woven Gauze Sponge, Non-Sterile 4x4 in (Home Health) 1 x Per Day/7 Days Discharge Instructions: Apply over primary dressing as directed. Secondary Dressing: ALLEVYN Heel 4 1/2in x 5 1/2in / 10.5cm x 13.5cm (Home Health) 1 x Per Day/7 Days Discharge Instructions:  Apply over primary dressing as directed. Secured With: [employer], 4.5x3.1 (in/yd) (Home Health) 1 x Per Day/7 Days Discharge Instructions: Secure with Kerlix as directed. Secured With: Paper Tape, 2x10 (in/yd) (Home Health) 1 x Per Day/7 Days Discharge Instructions: Secure dressing with tape as directed. Electronic Signature(s) Signed: 07/13/2021 5:12:42 PM By: [name] MD Signed: 07/13/2021 5:40:08 PM By: Deon Pilling RN, BSN Entered By: Deon Pilling on 07/13/2021 08:54:09 -------------------------------------------------------------------------------- Problem List Details Patient Name: Date of Service: [name], [place]. 07/13/2021 8:30 A M Medical Record Number: [id number] Patient Account Number: [id number] Date of Birth/Sex: Treating RN: 05/08/1941 (80 y.o. [name], [name] Primary Care Provider: [name] Other Clinician: Referring Provider: Treating Provider/[name]: Tressie Stalker in Treatment: 4 Active Problems ICD-10 Encounter Code Description Active Date MDM Diagnosis E11.621 Type 2 diabetes mellitus with foot ulcer 06/15/2021 No Yes T81.31XD Disruption of external operation (surgical) wound, not elsewhere classified, 06/15/2021 No Yes subsequent encounter E11.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 06/15/2021 No Yes L97.528 Non-pressure chronic ulcer of other part of left foot with other specified 06/15/2021 No Yes severity L97.428 Non-pressure chronic ulcer of left heel and midfoot with other specified 06/15/2021 No Yes severity Inactive Problems Resolved Problems Electronic Signature(s) Signed: 07/13/2021 5:12:42 PM By: [name] MD Entered By: [name] on 07/13/2021 08:57:20 -------------------------------------------------------------------------------- Progress Note Details Patient Name: Date of Service: [name], [name] Mourning. 07/13/2021 8:30 A M Medical  Record Number: [id number] Patient Account Number: [id number] Date of Birth/Sex: Treating RN: 16-Feb-1941 (80 y.o. [name] Primary Care Provider: [name] Other Clinician: Referring Provider: Treating Provider/[name]: Tressie Stalker in Treatment: 4 Subjective History of Present Illness (HPI) ADMISSION 07/13/2019 Patient is a 80 year old type II diabetic. She has known PAD. She has been followed by Dr. Jacqualyn Posey of podiatry for blistering areas on her toes dating back to 04/30/2019 which at this was  the left fourth toe. By 8/11 she had wounds on the right and left second toes. She underwent arterial studies by Dr. Alvester Chou on 7/31 that showed ABIs on the right of 0.60 TBI of 0.26 on the left ABI of 0.56 and a TBI of 0.25. By 9/10 she had ischemic-looking wounds per Dr. Earleen Newport on the right first, left first second and third. She has been using Medihoney and then mupirocin more recently simply Betadine. The patient underwent angiography by Dr. Gwenlyn Found on 9/21. 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 Noted that there was 1 vessel runoff in the foot via the peroneal. ooOn the left there was an 80% left SFA, 70% mid left SFA. There was a short segment calcified CTO distal less than SFA and above-knee popliteal artery reconstituting with two-vessel runoff. 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 It was felt that she had bilateral SFA disease as well as tibial vessel disease. An attempt was made to revascularize the left SFA but they were unable to cross because of the highly calcified nature of the lesion. 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 She has an area on the medial left fourth toe with raised horned callus  on top of this. I am not certain what this represents. With regards to pain she has about a 2-1/2 I will claudication tolerance in the grocery store. She has some pain in night which is relieved by putting her feet down over the side of the bed. She is wearing Nitro-Dur patches on the top of her right foot. Past medical history includes type 2 diabetes with secondary PAD, neoplasm of the skin, diabetic retinopathy, carotid artery stenosis, hyperlipidemia hypertension. 11/2; the last time the patient was here I spoke to Dr. Gwenlyn Found about revascularization options on the right. As I understand things currently Dr. Gwenlyn Found spoke with Dr. Trula Slade and ultimately the patient was taken to surgery on 10/27. She had a right iliofemoral endarterectomy with a bovine patch angioplasty. I think the plan now is for her to have a staged intervention on the right SFA by Dr. Alvester Chou. Per the patient's understanding Dr. Gwenlyn Found and Dr. Jacqualyn Posey are waiting to see when the gangrenous toes on the right foot can be amputated. The patient states her pain is a lot better and she is grateful for that. 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 Small area on the left fourth toe. She is using Betadine to the mummified areas on the right and Medihoney on the left 08/27/2019; the last time I saw this patient I discharged her from the clinic. She had been revascularized by Dr. Trula Slade and she had a right iliofemoral endarterectomy with a bovine angioplasty. 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 Jacqualyn Posey of podiatry on 08/07/2019. I note that she also had a intervention by Dr. Gwenlyn Found and he performed a directional atherectomy and drug-coated balloon angioplasty of the SFA and popliteal artery on the right. I am not certain of the date of Dr. Kennon Holter procedure as of the time of this dictation. She was referred back to [place] by Dr. Earleen Newport predominantly for  follow-up of the left fourth toe. She still has sutures and  stitches in the right TMA site. She states her pain is a lot better. She expresses concern about the condition of the amputation site at the TMA. She is on doxycycline I think prescribed by Dr. Earleen Newport. She is complaining of some pain at night 12/10; I spoke to Dr. Jacqualyn Posey last week. He removed the sutures on the right foot on Monday of this week. She has the area on the left fourth toe just proximal to the PIP and then the right TMA site. She is still on doxycycline and has enough through next week. Unfortunately the TMA site does not look good at all. Both on the lateral and medial part of the incisions are areas that probe to bone. There is purulence over the medial part which I have cultured. We will use silver alginate. Left fourth toe looks somewhat better but there was still exposed bone 12/17; patient has an MRI booked for 12/30. Culture I did last week showed Pseudomonas Serratia and Enterococcus. This was purulent drainage coming out of the medial part of her amputation site. I use cefdinir 300 twice daily for 10 days that started on 12/15. Her x-ray on the right showed limited evaluation for osteomyelitis. The findings could have been postoperative. There was subtle erosion in the distal first and distal fifth metatarsal. An MRI was suggested. On the left she had irregularity of the fourth middle and proximal phalanx consistent with a history of osteomyelitis. I do not know that she has a history of osteomyelitis in this area. She had a newly defined area on the plantar third toe 12/31; patient's MRI is listed below: MRI OF THE RIGHT FOREFOOT WITHOUT AND WITH CONTRAST TECHNIQUE: Multiplanar, multisequence MR imaging of the right forefoot was performed before and after the administration of intravenous contrast. CONTRAST 6 mL GADAVIST IV SOLN : COMPARISON: Plain films right foot 09/04/2019. FINDINGS: Bones/Joint/Cartilage The  patient is status post transmetatarsal amputation as seen on the prior exam. Marrow edema and enhancement are seen in all of the distal metatarsals. In the first metatarsal, signal change extends 1.5 cm proximal to the stump and in the second metatarsal extends approximately 2 cm proximal to the stump. Edema and enhancement are seen in only the distal 0.7 cm of the third metatarsal stump and tips of the fourth and fifth metatarsals. Bone marrow signal is otherwise unremarkable. A small focus of subchondral edema is seen in the lateral talus, likely degenerative. Ligaments Intact. Muscles and Tendons No intramuscular fluid collection. Soft tissues Skin ulceration is seen off the stump of the first metatarsal. A thin fluid collection tracks deep to the wound and over the anterior metatarsals worrisome for small abscess. Intense subcutaneous edema and enhancement are seen diffusely. 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 Cellulitis about the foot. Skin ulceration over the distal first metatarsal is identified with a thin fluid collection tracking anteriorly along the stump worrisome for abscess. Small focus of subchondral edema in the lateral talus is likely degenerative. Electronically Signed By: Inge Rise M.D. On: 09/23/2019 15:25 Patient arrives in clinic today not complaining any of any pain. 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 She does not describe claudication however her activity is very limited. 10/01/2019; since the patient was last here I have communicated with Dr. Gwenlyn Found after bypass by Dr. Trula Slade and addressing the right  superficial femoral artery he states that she is widely patent through peroneal arteries to the ankle with collaterals to  the dorsalis pedis. He states he is going to talk to colleagues about the feasibility of tibial pedal access. The patient seems infectious disease later this afternoon Dr. Baxter Flattery. in preparation for this she has been off antibiotics for 1 week and I went ahead and obtain pieces of the remanence of her first metatarsal for pathology and CandS. The patient is a candidate for hyperbaric oxygen. 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 1/15; considerable improvement in the wounds on her feet. We are using silver collagen. She follows with Dr. Baxter Flattery of infectious disease and is on meropenem and daptomycin. She has been taught how to do this herself at home. Follows with Dr. Graylon Good at the end of treatment here. She has 2 wounds on the surgical TMA site 1 lateral and 1 medial the lateral 1 has regressed tremendously. The area medially still has some exposed bone although the base of this looks healthy. 1/22; 2 separate wounds on the surgical TMA site. Both of these look satisfactory. The medial area does not have any exposed bone. This is an improvement On the left side fourth toe dorsally over the proximal phalanx there is a deep punched out area that probes to bone. She has an area on the tip of the left third toe. She also tells [place] that in HBO she traumatized her left Achilles and this is left her with a superficial wound area 1/28; weekly visit along with HBO. She has 5 wounds. T our punched out areas on the original TMA site on the right. Both of these appear to have contracted. o The area on the right no longer has exposed bone. She has an area on the tip of the left third toe and on the DIP of the left fourth toe. Both of these had surface callus that I removed and unfortunately they have small areas that both go to bone. She has a traumatic wound on the left Achilles area that happened in HBO and that appears better. She is tolerating her IV  antibiotics well at home. She has home health changing the dressings and she is doing it once on the weekends. We have been using silver collagen. She has been extensively revascularized on the right by Dr. Trula Slade and subsequently by Dr. Gwenlyn Found. 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 Baxter Flattery of infectious disease on 2/17. Dr. Gwenlyn Found on 2/23 and according to her her antibiotics stopped on 2/23. She is tolerating hyperbarics well. She has made a tremendous improvement in the right forefoot with only 2 smaller open wounds. Using silver collagen. On the left foot she has the area on the tip of the left third toe and the medial part of the left fourth toe. These had exposed bone last week I did not sense any of that today 2/12; sees Dr. Graylon Good on 2/17 and Dr. Gwenlyn Found on 2/23. We are going to use Dermagraft on this today however the lateral part of her train TMA incision on the right is healed and the medial part is down so much that we just continue with the silver collagen She has wounds on the tip of her left third and the medial aspect of the left fourth.  Both of these still have exposed bone I have not been able to get these to epithelialize. 2/19; she sees Dr. Gwenlyn Found on 2/23 and I have communicated with him about the left vascular supply. Looking at her angiogram from 08/03/2019 it looks as though that they could not cross the left CFA. Noted that she had one-vessel runoff bilaterally. She also apparently saw Dr. Graylon Good although I did not look up these results for preparation of this record. 2/26; Dr. Gwenlyn Found is going to do an angiogram next week on Wednesday. I think they are going to try to go at this both retrograde and anterograde to see if anything can be done to revascularize the left lower limb. She continues to make nice progress on the remaining wound on the right  medial foot on her TMA site and the toe it wounds on the left are responding nicely as well 3/12; the patient underwent an extensive and complicated revascularization on the left leg by Dr. Fletcher Anon on 3/3; she had an atherectomy on occluded left popliteal artery; anterior tibial artery followed by a balloon angioplasty. Atherectomy was also performed to the left SFA because there was still significant 50% stenosis in the left popliteal artery they performed an intravascular lithotripsy which improved the residual stenosis to 20%. The same lithotripsy was used to dilate the proximal left SFA. She had a drug-eluting stent placed I believe in the SFA. The patient returned for hyperbarics this week. She has had some eye problems on the right which she tells me are secondary to diabetic retinopathy and she saw her eye doctor. She is really made excellent progress there is no open wounds on the left foot at all. She has one open area medially and her TMA site on the right lateral wound is closed. 3/19; the patient comes in today with the area on the medial transmet on the right improved. Some debris removed from the surface revealed the still open wound. ooUnfortunately she now has the open area on the third toe tip and the medial aspect of the dorsal fourth toe. These are in the same location as her previous wounds. These had actually closed up last week. 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 She is undergone complex revascularizations on the right by Dr. Trula Slade and Dr. Gwenlyn Found and I believe Dr. 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 She completed IVantibiotics as directed by infectious disease and I believe a  course of oral antibiotics as well.she underwent a complete 40 treatments of hyperbarics. She comes in today with the vast majority of the right TMA site healed there is a small opening on the most medial aspect. Her left third and left fourth toes appear to have epithelialization although this has fluctuated somewhat. 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 She had a gaping wound on the right transmetatarsal amputation L as well as deep wounds on the left third and left fourth toes. She underwent complex revascularizations on her bilateral lower legs which I described on her last visit. She completed IV antibiotics by infectious disease and 40 treatments of hyperbarics. Her left third and fourth toes are healed. The area on the right transmetatarsal amputation site is also closed today.  READMISSION 08/04/2020 [name] is now a 80 year old woman that we had for a complex stay in our clinic from October 2000 14 January 2020. 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 Her current problems with wounds began on October 30 when she developed a spontaneous opening over the right transmet medial aspect over the first metatarsal head. She has been using collagen on this that she had leftover from her last stay in this clinic. She also has had an open area on the medial right calf from a vein harvest site from her CABG she. She said that this is never really closed since the vein harvest was done. With regards to her arterial status this is a major issue. She had an angiogram by Dr. Gwenlyn Found in September 2020. She developed a gangrenous right first and second toes. Ultimately she was referred to Dr. Trula Slade who did a right common femoral endarterectomy with patch angioplasty. This was on 07/21/2019 and she really had a good result. Dr. Gwenlyn Found did a  atherectomy followed by a drug coated balloon angioplasty of her right SFA popliteal and tibial peroneal trunk on 08/03/2019 again with excellent excellent results. Follow-up.Dopplers in November revealed revealed a widely patent SFA and tibioperoneal trunk. UNFORTUNATELY her recent angiogram that was done on 08/01/2020 showed an 80% right common femoral artery stenosis just above the previous endarterectomy site occluded right right SFA at the origin reconstituting the adductor canal by the profunda femoris collaterals. The profunda femoris is also diffusely diseased. There is and again another 80% tibioperoneal trunk stenosis and one-vessel runoff via the peroneal artery. She is seen Dr. Donzetta Matters and she is scheduled for a femoral endarterectomy and right femoral-popliteal bypass grafting with endarterectomy of her tibial peroneal trunk. The scheduled surgery is on 11/23 The patient does not complain of pain at rest. She does have 5-minute walking claudication. The wounds on her right first met head remanent was spontaneous she does not think she hit this. As mentioned in the area on her right medial calf is a vein harvest site Dr. Gwenlyn Found had texted me about getting her in the clinic and we have arranged this as soon as we can. I have told her that we will have to see how successful Dr. Donzetta Matters is before we know what can be done about the wounds in a precise fashion. Fortunately there does not seem to be any obvious infection involving the right foot although that may need to be looked at if things really deteriorate. She was treated with IV antibiotics and hyperbaric oxygen for her previous osteomyelitis 12/2; the patient underwent a right femoral endarterectomy and bypass femoral to popliteal artery. This was done on 08/16/2020. She is done remarkably well. She has a small area on the right anterior tibia and a small area in the medial part of her original right transmetatarsal amputation. She appears to  have had an excellent response to surgery. 12/16; the patient's area on the right tibia and the small area on the medial part of her original right transmetatarsal amputation is totally healed READMISSION 06/15/2021 [name] is a now 80 year old woman who arrives for review of wounds on the left TMA site extending into her plantar foot as well as an area on the left posterior calcaneus. 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 She was extensively revascularized on the right at that time as noted above in our previous notes ended up receiving IV antibiotics and hyperbaric oxygen therapy. Miraculously this TMA site actually closed and she really has had not any trouble with this since. UNFORTUNATELY the same cannot be said of her left side. Her problem apparently started sometime in July when she hit her left third toe on a barstool while playing pool. She developed an acute infection which eventually led to underlying osteomyelitis. She underwent an extensive hospitalization from 04/27/2021 through 05/15/2021. She received IV antibiotics. Also extensive intervention by Dr. Stanford Breed of vein and vascular which included a left common femoral to tibioperoneal trunk bypass as well as a left third toe amputation on 05/03/2021. Unfortunately the area did not heal she ended up requiring a left TMA. By her description this was left open. She has been using a wound VAC on this to earlier this week and then was switched to Santyl. I think because of the left forefoot off loader she is also developed an area on the left posterior calcaneus. She is not complaining of a lot of pain. She has her daughter at home to change the dressings. She has been prescribed Santyl and she has a tube that she is brought into our clinic. I cannot get a lot of history of claudication although she is really not that mobile at this point walking  with a walker. 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 She has had multiple interventions on the right side by vein and vascular and also by Dr. Gwenlyn Found as well as a transmetatarsal amputation on the right. She has had follow-up noninvasive studies on 06/06/2021; on the left that showed an ABI of 0.47 with monophasic waveforms a dorsalis pedis ABI of 0.40 with monophasic waveforms. Her ANGIOGRAM which was initially done by Dr. Oneida Alar on 04/28/2021 showed severe left foot superficial femoral artery occlusive disease and an occlusion of the left popliteal artery with two-vessel runoff to the left foot with moderate to severe disease below the knee popliteal artery and tibial disease. She had 80% distal right external iliac artery above the existing femoral above-knee popliteal bypass but she does not have an open wound on the right foot She arrives in clinic with an extensive open area on the TMA site which in the mid aspect extends into the plantar foot. She also has a more superficial area on the upper part of her Achilles area of the left heel 9/29; patient arrives in clinic today with slough over the TMA site. We use Molecu[employer] to look at the surface of this suggesting cyan and white discoloration over a large part of this wound also extending into the third metatarsal area. She also has the area on the left posterior calcaneus. This is slough covered we will switch to [facility] here as well. There is some warmth in the area and some erythema we will give her antibiotics as well 10/6; completely necrotic surface once again on the left TMA site. We have been using Santyl and Hydrofera Blue. She has an area on the left posterior Achilles and actually states this has been more painful this week indeed there is some erythema around this area. She also describes I  think claudication at rest which is relieved by dipping her  left leg over the side of the bed at night. 10/13; our intake nurse was able to brush some slough off the surface of the left TMA hence this did not require debridement that is better than last week. The area heading towards the plantar part of her foot required an aggressive debridement to clean this up. This is quite a sizable divot but does not go to bone. The area on the right Achilles heel is also covered in a high 100% nonviable tissue we have been using Santyl and silver alginate 10/20; the area on the left TMA looks much better. Even the area is spreading into the midfoot looks improved. The right Achilles still is covered and nonviable surface 100%. We have been using Santyl and silver alginate and this will continue for this week The patient is approved for 5 Apligraf's and I think she is good enough to start using these next week Objective Constitutional Patient is hypertensive.. Pulse regular and within target range for patient.Marland Kitchen Respirations regular, non-labored and within target range.. Temperature is normal and within the target range for the patient.Marland Kitchen Appears in no distress. Vitals Time Taken: 8:29 AM, Height: 64 in, Weight: 135 lbs, BMI: 23.2, Temperature: 97.5 F, Pulse: 103 bpm, Respiratory Rate: 16 breaths/min, Blood Pressure: 170/74 mmHg, Capillary Blood Glucose: 78 mg/dl. General Notes: Wound exam; substantial wound over the left TMA continues to look better from a surface point of view healthy looking tissue. Even the area in the mid part of this that goes into the midfoot looks considerably better. oo The area on the Achilles heel still 100% surface debris I used a #3 curette to gently debride this [not a lot of tissue over bone]. Integumentary (Hair, Skin) Wound #13 status is Open. Original cause of wound was Surgical Injury. The date acquired was: 05/11/2021. The wound has been in treatment 4 weeks. The wound is  located on the Left Amputation Site - Transmetatarsal. The wound measures 4.6cm length x 6.6cm width x 0.5cm depth; 23.845cm^2 area and 11.922cm^3 volume. There is Fat Layer (Subcutaneous Tissue) exposed. There is no tunneling or undermining noted. There is a medium amount of serosanguineous drainage noted. The wound margin is distinct with the outline attached to the wound base. There is large (67-100%) red granulation within the wound bed. There is a small (1-33%) amount of necrotic tissue within the wound bed including Adherent Slough. Wound #14 status is Open. Original cause of wound was Pressure Injury. The date acquired was: 05/11/2021. The wound has been in treatment 4 weeks. The wound is located on the Left,Posterior Calcaneus. The wound measures 0.5cm length x 1.3cm width x 0.2cm depth; 0.511cm^2 area and 0.102cm^3 volume. There is Fat Layer (Subcutaneous Tissue) exposed. There is no tunneling or undermining noted. There is a medium amount of serous drainage noted. The wound margin is flat and intact. There is small (1-33%) pink granulation within the wound bed. There is a large (67-100%) amount of necrotic tissue within the wound bed including Adherent Slough. 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 There was a Chemical/Enzymatic/Mechanical debridement performed by Deon Pilling, RN.Marland Kitchen Agent used  was [employer]. There was no bleeding. The procedure was tolerated well. Post Debridement Measurements: 4.5cm length x 6.6cm width x 0.5cm depth; 11.663cm^3 volume. Character of Wound/Ulcer Post Debridement requires further debridement. Severity of Tissue Post Debridement is: Fat layer exposed. Post procedure Diagnosis Wound #13: Same as Pre-Procedure Wound #14 Pre-procedure diagnosis of Wound #14 is a Diabetic Wound/Ulcer of the Lower Extremity located on the Left,Posterior Calcaneus .Severity of Tissue Pre Debridement is: Fat layer exposed. There was a Excisional Skin/Subcutaneous Tissue Debridement with a total area of 0.65 sq cm performed by Ricard Dillon., MD. With the following instrument(s): Curette to remove Viable and Non-Viable tissue/material. Material removed includes Subcutaneous Tissue, Slough, Skin: Dermis, Skin: Epidermis, and Fibrin/Exudate after achieving pain control using Other (Benzocaine 20%). A time out was conducted at 08:48, prior to the start of the procedure. A Minimum amount of bleeding was controlled with Pressure. The procedure was tolerated well with a pain level of 0 throughout and a pain level of 0 following the procedure. Post Debridement Measurements: 0.5cm length x 1.3cm width x 0.2cm depth; 0.102cm^3 volume. Character of Wound/Ulcer Post Debridement requires further debridement. Severity of Tissue Post Debridement is: Fat layer exposed. Post procedure Diagnosis Wound #14: Same as Pre-Procedure Plan Follow-up Appointments: Return Appointment in 1 week. - with Dr. Dellia Nims Cellular or Tissue Based Products: Cellular or Tissue Based Product Type: - [employer] to order apligraf and to be applied next week. Bathing/ Shower/ Hygiene: May shower and wash wound with soap and water. Off-Loading: Open toe surgical [name] to: - right foot Wedge [name] to: - left foot Home Health: No change in wound care orders this week; continue Home Health for wound care. May utilize  formulary equivalent dressing for wound treatment orders unless otherwise specified. Other Home Health Orders/Instructions: - [facility] 3x a week WOUND #13: - Amputation Site - Transmetatarsal Wound Laterality: Left Cleanser: Soap and Water 1 x Per Day/7 Days Discharge Instructions: May shower and wash wound with dial antibacterial soap and water prior to dressing change. Cleanser: Wound Cleanser (Home Health) 1 x Per Day/7 Days Discharge Instructions: Cleanse the wound with wound cleanser or normal saline prior to applying a clean dressing using gauze sponges, not tissue or cotton balls. Prim Dressing: KerraCel Ag Gelling Fiber Dressing, 4x5 in (silver alginate) (Home Health) 1 x Per Day/7 Days ary Discharge Instructions: Apply silver alginate over the santyl. Prim Dressing: Santyl Ointment 1 x Per Day/7 Days ary Discharge Instructions: Apply nickel thick amount to wound bed as instructed Secondary Dressing: Woven Gauze Sponge, Non-Sterile 4x4 in (Home Health) 1 x Per Day/7 Days Discharge Instructions: Apply over primary dressing as directed. Secondary Dressing: ABD Pad, 5x9 (Home Health) 1 x Per Day/7 Days Discharge Instructions: Apply over primary dressing as directed. Secured With: [employer], 4.5x3.1 (in/yd) (Home Health) 1 x Per Day/7 Days Discharge Instructions: Secure with Kerlix as directed. Secured With: Paper T ape, 2x10 (in/yd) (Home Health) 1 x Per Day/7 Days Discharge Instructions: Secure dressing with tape as directed. WOUND #14: - Calcaneus Wound Laterality: Left, Posterior Cleanser: Soap and Water 1 x Per Day/7 Days Discharge Instructions: May shower and wash wound with dial antibacterial soap and water prior to dressing change. Cleanser: Wound Cleanser (Home Health) 1 x Per Day/7 Days Discharge Instructions: Cleanse the wound with wound cleanser or normal saline prior to applying a clean dressing using gauze sponges, not  tissue or cotton balls. Prim Dressing:  KerraCel Ag Gelling Fiber Dressing, 4x5 in (silver alginate) (Home Health) 1 x Per Day/7 Days ary Discharge Instructions: Apply silver alginate over santyl. Prim Dressing: Santyl Ointment 1 x Per Day/7 Days ary Discharge Instructions: Apply nickel thick amount to wound bed as instructed Secondary Dressing: Woven Gauze Sponge, Non-Sterile 4x4 in (Home Health) 1 x Per Day/7 Days Discharge Instructions: Apply over primary dressing as directed. Secondary Dressing: ALLEVYN Heel 4 1/2in x 5 1/2in / 10.5cm x 13.5cm (Home Health) 1 x Per Day/7 Days Discharge Instructions: Apply over primary dressing as directed. Secured With: [employer], 4.5x3.1 (in/yd) (Home Health) 1 x Per Day/7 Days Discharge Instructions: Secure with Kerlix as directed. Secured With: Paper T ape, 2x10 (in/yd) (Home Health) 1 x Per Day/7 Days Discharge Instructions: Secure dressing with tape as directed. #1 Apligraf ordered for next week 2. Continue silver alginate and Santyl this week 3. I am hopeful the area on the Achilles heel will be good enough in terms of his surface to allow me to put a small amount of Apligraf on this as well 4. Although she has severe PAD her tissue looks viable enough for the trial of Apligraf. Hopefully we believe successful here. Electronic Signature(s) Signed: 07/13/2021 5:12:42 PM By: [name] MD Entered By: [name] on 07/13/2021 09:03:06 -------------------------------------------------------------------------------- SuperBill Details Patient Name: Date of Service: [name], [name] Mourning 07/13/2021 Medical Record Number: [id number] Patient Account Number: [id number] Date of Birth/Sex: Treating RN: 1941/06/20 (80 y.o. 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 Facility Procedures CPT4 Code: [id number] Description: 53748 - DEBRIDE W/O ANES NON SELECT Modifier: 59 Quantity: 1 CPT4 Code: 27078675 Description: 44920 - DEB SUBQ TISSUE 20 SQ CM/< ICD-10 Diagnosis Description L97.428 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

## 2021-07-14 ENCOUNTER — Other Ambulatory Visit: Payer: Self-pay | Admitting: Endocrinology

## 2021-07-14 DIAGNOSIS — M86172 Other acute osteomyelitis, left ankle and foot: Secondary | ICD-10-CM | POA: Diagnosis not present

## 2021-07-14 DIAGNOSIS — I10 Essential (primary) hypertension: Secondary | ICD-10-CM | POA: Diagnosis not present

## 2021-07-14 DIAGNOSIS — E11628 Type 2 diabetes mellitus with other skin complications: Secondary | ICD-10-CM | POA: Diagnosis not present

## 2021-07-14 DIAGNOSIS — D638 Anemia in other chronic diseases classified elsewhere: Secondary | ICD-10-CM | POA: Diagnosis not present

## 2021-07-14 DIAGNOSIS — L03116 Cellulitis of left lower limb: Secondary | ICD-10-CM | POA: Diagnosis not present

## 2021-07-14 DIAGNOSIS — I70202 Unspecified atherosclerosis of native arteries of extremities, left leg: Secondary | ICD-10-CM | POA: Diagnosis not present

## 2021-07-15 DIAGNOSIS — R262 Difficulty in walking, not elsewhere classified: Secondary | ICD-10-CM | POA: Diagnosis not present

## 2021-07-15 DIAGNOSIS — E785 Hyperlipidemia, unspecified: Secondary | ICD-10-CM | POA: Diagnosis not present

## 2021-07-15 DIAGNOSIS — Z7982 Long term (current) use of aspirin: Secondary | ICD-10-CM | POA: Diagnosis not present

## 2021-07-15 DIAGNOSIS — I10 Essential (primary) hypertension: Secondary | ICD-10-CM | POA: Diagnosis not present

## 2021-07-15 DIAGNOSIS — Z89412 Acquired absence of left great toe: Secondary | ICD-10-CM | POA: Diagnosis not present

## 2021-07-15 DIAGNOSIS — U071 COVID-19: Secondary | ICD-10-CM | POA: Diagnosis not present

## 2021-07-15 DIAGNOSIS — M6281 Muscle weakness (generalized): Secondary | ICD-10-CM | POA: Diagnosis not present

## 2021-07-15 DIAGNOSIS — Z7901 Long term (current) use of anticoagulants: Secondary | ICD-10-CM | POA: Diagnosis not present

## 2021-07-15 DIAGNOSIS — Z4801 Encounter for change or removal of surgical wound dressing: Secondary | ICD-10-CM | POA: Diagnosis not present

## 2021-07-15 DIAGNOSIS — L89620 Pressure ulcer of left heel, unstageable: Secondary | ICD-10-CM | POA: Diagnosis not present

## 2021-07-15 DIAGNOSIS — Z89422 Acquired absence of other left toe(s): Secondary | ICD-10-CM | POA: Diagnosis not present

## 2021-07-15 DIAGNOSIS — D638 Anemia in other chronic diseases classified elsewhere: Secondary | ICD-10-CM | POA: Diagnosis not present

## 2021-07-15 DIAGNOSIS — L03116 Cellulitis of left lower limb: Secondary | ICD-10-CM | POA: Diagnosis not present

## 2021-07-15 DIAGNOSIS — E11628 Type 2 diabetes mellitus with other skin complications: Secondary | ICD-10-CM | POA: Diagnosis not present

## 2021-07-15 DIAGNOSIS — M86172 Other acute osteomyelitis, left ankle and foot: Secondary | ICD-10-CM | POA: Diagnosis not present

## 2021-07-15 DIAGNOSIS — I70202 Unspecified atherosclerosis of native arteries of extremities, left leg: Secondary | ICD-10-CM | POA: Diagnosis not present

## 2021-07-15 DIAGNOSIS — Z4781 Encounter for orthopedic aftercare following surgical amputation: Secondary | ICD-10-CM | POA: Diagnosis not present

## 2021-07-15 DIAGNOSIS — H409 Unspecified glaucoma: Secondary | ICD-10-CM | POA: Diagnosis not present

## 2021-07-15 DIAGNOSIS — Z7984 Long term (current) use of oral hypoglycemic drugs: Secondary | ICD-10-CM | POA: Diagnosis not present

## 2021-07-17 ENCOUNTER — Telehealth: Payer: Self-pay | Admitting: Endocrinology

## 2021-07-17 ENCOUNTER — Other Ambulatory Visit: Payer: Self-pay

## 2021-07-17 DIAGNOSIS — M86172 Other acute osteomyelitis, left ankle and foot: Secondary | ICD-10-CM | POA: Diagnosis not present

## 2021-07-17 DIAGNOSIS — Z4781 Encounter for orthopedic aftercare following surgical amputation: Secondary | ICD-10-CM | POA: Diagnosis not present

## 2021-07-17 DIAGNOSIS — I70202 Unspecified atherosclerosis of native arteries of extremities, left leg: Secondary | ICD-10-CM | POA: Diagnosis not present

## 2021-07-17 DIAGNOSIS — Z4801 Encounter for change or removal of surgical wound dressing: Secondary | ICD-10-CM | POA: Diagnosis not present

## 2021-07-17 DIAGNOSIS — D638 Anemia in other chronic diseases classified elsewhere: Secondary | ICD-10-CM | POA: Diagnosis not present

## 2021-07-17 DIAGNOSIS — L89620 Pressure ulcer of left heel, unstageable: Secondary | ICD-10-CM | POA: Diagnosis not present

## 2021-07-17 MED ORDER — CANAGLIFLOZIN 300 MG PO TABS: 300.0000 mg | ORAL_TABLET | Freq: Every day | ORAL | 3 refills | Status: DC

## 2021-07-17 NOTE — Telephone Encounter (Signed)
Rx has been sent to pharmacy and patient notified

## 2021-07-17 NOTE — Progress Notes (Signed)
[facility] [place] PROGRESS NOTE  ASSESSMENT / PLAN: [name] is a 80 y.o. 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 Maintained on Xarelto 2.5mg  PO QD / ASA. Her left open TMA is healing nicely. The bypass appears patent on exam today. Continue wound care with the [facility] - they are taking excellent care of her foot. Will see her again in December to repeat non-invasive testing. She was encouraged to follow up if her foot deteriorates.   SUBJECTIVE: Returns to clinic for wound check.  She is doing quite well.  Her wound seems to be healing.  The wound center has been treating her with Santyl and Aquacel with good result.  And replacement therapy is due to be initiated soon.  OBJECTIVE: BP 128/88 (BP Location: Left Arm, Patient Position: Sitting, Cuff Size: Normal)   Pulse 70   Temp 98.8 F (37.1 C)   Resp 20   Ht 5' 4.5" (1.638 m)   Wt 141 lb (64 kg)   SpO2 99%   BMI 23.83 kg/m   No acute distress TMA is healing nicely with heaped up granulation tissue Wounds about the feet appear to be healing; healthy granulation tissue at the base of the wounds.  Mild fibrinous exudate at the edges of the wounds. nearly triphasic flow in the anterior tibial artery.  CBC Latest Ref Rng & Units 05/15/2021 05/14/2021 05/13/2021  WBC 4.0 - 10.5 K/uL 12.6(H) 14.1(H) 15.1(H)  Hemoglobin 12.0 - 15.0 g/dL 8.2(L) 8.0(L) 7.8(L)  Hematocrit 36.0 - 46.0 % 26.8(L) 25.4(L) 24.8(L)  Platelets 150 - 400 K/uL 517(H) 461(H) 432(H)     CMP Latest Ref Rng & Units 06/28/2021 05/15/2021 05/13/2021  Glucose 70 - 99 mg/dL 203(H) 156(H) 134(H)  BUN 6 - 23 mg/dL 29(H) 8 11  Creatinine 0.40 - 1.20 mg/dL 1.26(H) 0.80 0.75  Sodium 135 - 145 mEq/L 139 138 137  Potassium 3.5 - 5.1 mEq/L 4.7 3.6 3.4(L)  Chloride 96 - 112 mEq/L 103 105 108  CO2 19 - 32 mEq/L 24 27 20(L)  Calcium 8.4 - 10.5 mg/dL 10.5 8.9  8.8(L)  Total Protein 6.5 - 8.1 g/dL - - -  Total Bilirubin 0.3 - 1.2 mg/dL - - -  Alkaline Phos 38 - 126 U/L - - -  AST 15 - 41 U/L - - -  ALT 0 - 44 U/L - - -    [name] N. Stanford Breed, MD [facility] Phone Number: [phone] 07/18/2021 11:16 AM

## 2021-07-17 NOTE — Telephone Encounter (Signed)
Pt calling wondering why she was taken off canagliflozin (INVOKANA) 300 MG TABS tablet or why it wasn't refilled and if she was taken off, why wasn't patient notified.   Pt contact [phone]

## 2021-07-17 NOTE — Progress Notes (Signed)
LEVANA, MINETTI ([id number]) Visit Report for 07/13/2021 Arrival Information Details Patient Name: Date of Service: [name] 07/13/2021 8:30 A M Medical Record Number: [id number] Patient Account Number: [id number] Date of Birth/Sex: Treating RN: [birth year] (80 y.o. [name], [name] Primary Care Eastyn Dattilo: [name] Other Clinician: Referring Jeronimo Hellberg: Treating Harmani Neto/[name]: Tressie Stalker in Treatment: 4 Visit Information History Since Last Visit Added or deleted any medications: No Patient Arrived: [name] Any new allergies or adverse reactions: No Arrival Time: 08:25 Had a fall or experienced change in No Accompanied By: self activities of daily living that may affect Transfer Assistance: None risk of falls: Patient Identification Verified: Yes Signs or symptoms of abuse/neglect since last visito No Secondary Verification Process Completed: Yes Hospitalized since last visit: No Patient Requires Transmission-Based Precautions: No Implantable device outside of the clinic excluding No Patient Has Alerts: Yes cellular tissue based products placed in the center Patient Alerts: Patient on Blood Thinner since last visit: L ABI: 0.82 Has Dressing in Place as Prescribed: Yes R ABI: 0.47 Has Footwear/Offloading in Place as Prescribed: Yes Left: Wedge Shoe Pain Present Now: Yes Electronic Signature(s) Signed: 07/13/2021 5:40:08 PM By: Deon Pilling RN, BSN Entered By: Deon Pilling on 07/13/2021 08:26:28 -------------------------------------------------------------------------------- Encounter Discharge Information Details Patient Name: Date of Service: [name], [place]. 07/13/2021 8:30 A M Medical Record Number: [id number] Patient Account Number: [id number] Date of Birth/Sex: Treating RN: 1941/04/28 (80 y.o. [name] Primary Care Josey Dettmann: [name] Other Clinician: Referring Timya Trimmer: Treating Aseneth Hack/[name]:  Tressie Stalker in Treatment: 4 Encounter Discharge Information Items Post Procedure Vitals Discharge Condition: Stable Temperature (F): 97.5 Ambulatory Status: Walker Pulse (bpm): 103 Discharge Destination: Home Respiratory Rate (breaths/min): 16 Transportation: Private Auto Blood Pressure (mmHg): 170/74 Accompanied By: self Schedule Follow-up Appointment: Yes Clinical Summary of Care: Electronic Signature(s) Signed: 07/13/2021 5:40:08 PM By: Deon Pilling RN, BSN Entered By: Deon Pilling on 07/13/2021 08:55:36 -------------------------------------------------------------------------------- Lower Extremity Assessment Details Patient Name: Date of Service: [name], [name]. 07/13/2021 8:30 A M Medical Record Number: [id number] Patient Account Number: [id number] Date of Birth/Sex: Treating RN: 04-16-1941 (80 y.o. [name], [name] Primary Care Tyronn Golda: [name] Other Clinician: Referring Shaquoia Miers: Treating Maxamus Colao/[name]: Tressie Stalker in Treatment: 4 Edema Assessment Assessed: Shirlyn Goltz: Yes] Patrice Paradise: No] Edema: [Left: N] [Right: o] Calf Left: Right: Point of Measurement: From Medial Instep 32.8 cm Ankle Left: Right: Point of Measurement: From Medial Instep 20 cm Vascular Assessment Pulses: Dorsalis Pedis Palpable: [Left:Yes] Electronic Signature(s) Signed: 07/13/2021 5:40:08 PM By: Deon Pilling RN, BSN Entered By: Deon Pilling on 07/13/2021 08:31:23 -------------------------------------------------------------------------------- Multi Wound Chart Details Patient Name: Date of Service: [name], [name] LLY H. 07/13/2021 8:30 A M Medical Record Number: [id number] Patient Account Number: [id number] Date of Birth/Sex: Treating RN: Aug 06, 1941 (80 y.o. [name], [name] Primary Care Venesha Petraitis: [name] Other Clinician: Referring Hollyanne Schloesser: Treating Rhys Lichty/[name]: Tressie Stalker in Treatment: 4 Vital Signs Height(in): 66 Capillary Blood Glucose(mg/dl): 36 Weight(lbs): 135 Pulse(bpm): 68 Body Mass Index(BMI): 23 Blood Pressure(mmHg): 170/74 Temperature(F): 97.5 Respiratory Rate(breaths/min): 16 Photos: [13:Left Amputation Site - Transmetatarsal Left, Posterior Calcaneus] [N/A:N/A N/A] Wound Location: [13:Surgical Injury] [14:Pressure Injury] [N/A:N/A] Wounding Event: [13:Diabetic Wound/Ulcer of the Lower] [14:Diabetic Wound/Ulcer of the Lower] [N/A:N/A] Primary Etiology: [13:Extremity Open Surgical Wound] [14:Extremity N/A] [N/A:N/A] Secondary Etiology: [13:Coronary Artery Disease, Deep Vein Coronary Artery Disease, Deep Vein N/A] Comorbid History: [13:Thrombosis, Hypertension, Myocardial Thrombosis, Hypertension, Myocardial Infarction, Peripheral Arterial Disease, Infarction, Peripheral Arterial  Disease, Type II Diabetes 05/11/2021] [14:Type II Diabetes 05/11/2021] [N/A:N/A] Date Acquired: [13:4] [14:4] [N/A:N/A] Weeks of Treatment: [13:Open] [14:Open] [N/A:N/A] Wound Status: [13:4.6x6.6x0.5] [14:0.5x1.3x0.2] [N/A:N/A] Measurements L x W x D (cm) [13:23.845] [14:0.511] [N/A:N/A] A (cm) : rea [13:11.922] [14:0.102] [N/A:N/A] Volume (cm) : [13:37.70%] [14:63.00%] [N/A:N/A] % Reduction in A [13:rea: 65.40%] [14:26.10%] [N/A:N/A] % Reduction in Volume: [13:Grade 2] [14:Unable to visualize wound bed] [N/A:N/A] Classification: [13:Medium] [14:Medium] [N/A:N/A] Exudate A mount: [13:Serosanguineous] [14:Serous] [N/A:N/A] Exudate Type: [13:red, brown] [14:amber] [N/A:N/A] Exudate Color: [13:Distinct, outline attached] [14:Flat and Intact] [N/A:N/A] Wound Margin: [13:Large (67-100%)] [14:Small (1-33%)] [N/A:N/A] Granulation A mount: [13:Red] [14:Pink] [N/A:N/A] Granulation Quality: [13:Small (1-33%)] [14:Large (67-100%)] [N/A:N/A] Necrotic A mount: [13:Fat Layer (Subcutaneous Tissue): Yes Fat Layer (Subcutaneous Tissue): Yes N/A] Exposed  Structures: [13:Fascia: No Tendon: No Muscle: No Joint: No Bone: No Small (1-33%)] [14:Fascia: No Tendon: No Muscle: No Joint: No Bone: No None] [N/A:N/A] Epithelialization: [13:Chemical/Enzymatic/Mechanical] [14:Debridement - Excisional] [N/A:N/A] Debridement: Pre-procedure Verification/Time Out N/A [14:08:48] [N/A:N/A] Taken: [13:N/A] [14:Other] [N/A:N/A] Pain Control: [13:N/A] [14:Subcutaneous, Slough] [N/A:N/A] Tissue Debrided: [13:N/A] [14:Skin/Subcutaneous Tissue] [N/A:N/A] Level: [13:N/A] [14:0.65] [N/A:N/A] Debridement A (sq cm): [13:rea N/A] [14:Curette] [N/A:N/A] Instrument: [13:None] [14:Minimum] [N/A:N/A] Bleeding: [13:N/A] [14:Pressure] [N/A:N/A] Hemostasis A chieved: [13:N/A] [14:0] [N/A:N/A] Procedural Pain: [13:N/A] [14:0] [N/A:N/A] Post Procedural Pain: [13:Procedure was tolerated well] [14:Procedure was tolerated well] [N/A:N/A] Debridement Treatment Response: [13:4.5x6.6x0.5] [14:0.5x1.3x0.2] [N/A:N/A] Post Debridement Measurements L x W x D (cm) [13:11.663] [14:0.102] [N/A:N/A] Post Debridement Volume: (cm) [13:Debridement] [14:Debridement] [N/A:N/A] Treatment Notes Wound #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 Wound Cleanser Discharge Instruction: Cleanse the wound with wound cleanser or normal saline prior to applying a clean dressing using gauze sponges, not tissue or cotton balls. Peri-Wound Care Topical Primary Dressing KerraCel Ag Gelling Fiber Dressing, 4x5 in (silver alginate) Discharge Instruction: Apply silver alginate over the santyl. Santyl Ointment Discharge Instruction: Apply nickel thick amount to wound bed as instructed Secondary Dressing Woven Gauze Sponge, Non-Sterile 4x4 in Discharge Instruction: Apply over primary dressing as directed. ABD Pad, 5x9 Discharge Instruction: Apply  over primary dressing as directed. Secured With [employer], 4.5x3.1 (in/yd) Discharge Instruction: Secure with Kerlix as directed. Paper Tape, 2x10 (in/yd) Discharge Instruction: Secure dressing with tape as directed. Compression Wrap Compression Stockings Add-Ons Wound #14 (Calcaneus) Wound Laterality: Left, Posterior Cleanser Soap and Water Discharge Instruction: May shower and wash wound with dial antibacterial soap and water prior to dressing change. Wound Cleanser Discharge Instruction: Cleanse the wound with wound cleanser or normal saline prior to applying a clean dressing using gauze sponges, not tissue or cotton balls. Peri-Wound Care Topical Primary Dressing KerraCel Ag Gelling Fiber Dressing, 4x5 in (silver alginate) Discharge Instruction: Apply silver alginate over santyl. Santyl Ointment Discharge Instruction: Apply nickel thick amount to wound bed as instructed Secondary Dressing Woven Gauze Sponge, Non-Sterile 4x4 in Discharge Instruction: Apply over primary dressing as directed. ALLEVYN Heel 4 1/2in x 5 1/2in / 10.5cm x 13.5cm Discharge Instruction: Apply over primary dressing as directed. Secured With [employer], 4.5x3.1 (in/yd) Discharge Instruction: Secure with Kerlix as directed. Paper Tape, 2x10 (in/yd) Discharge Instruction: Secure dressing with tape as directed. Compression Wrap Compression Stockings Add-Ons Electronic Signature(s) Signed: 07/13/2021 5:12:42 PM By: [name] MD Signed: 07/13/2021 5:40:08 PM By: Deon Pilling RN, BSN Entered By: [name] on 07/13/2021 08:57:34 -------------------------------------------------------------------------------- Multi-Disciplinary Care Plan Details Patient Name: Date of Service: [name], [name] LLY  H. 07/13/2021 8:30 A M Medical Record Number: [id number] Patient Account Number: [id number] Date of Birth/Sex: Treating RN: 10/31/1940 (80 y.o. [name], [name] Primary Care Arrion Broaddus:  [name] Other Clinician: Referring Heydi Swango: Treating Isael Stille/[name]: Tressie Stalker in Treatment: 4 Multidisciplinary Care Plan reviewed with physician Active Inactive Nutrition Nursing Diagnoses: Impaired glucose control: actual or potential Potential for alteratiion in Nutrition/Potential for imbalanced nutrition Goals: Patient/caregiver agrees to and verbalizes understanding of need to use nutritional supplements and/or vitamins as prescribed Date Initiated: 06/15/2021 Target Resolution Date: 08/18/2021 Goal Status: Active Patient/caregiver will maintain therapeutic glucose control Date Initiated: 06/15/2021 Target Resolution Date: 08/10/2021 Goal Status: Active Interventions: Assess HgA1c results as ordered upon admission and as needed Assess patient nutrition upon admission and as needed per policy Provide education on elevated blood sugars and impact on wound healing Provide education on nutrition Treatment Activities: Education provided on Nutrition : 06/15/2021 Notes: Wound/Skin Impairment Nursing Diagnoses: Impaired tissue integrity Knowledge deficit related to ulceration/compromised skin integrity Goals: Patient/caregiver will verbalize understanding of skin care regimen Date Initiated: 06/15/2021 Target Resolution Date: 08/10/2021 Goal Status: Active Interventions: Assess patient/caregiver ability to obtain necessary supplies Assess patient/caregiver ability to perform ulcer/skin care regimen upon admission and as needed Assess ulceration(s) every visit Provide education on ulcer and skin care Notes: Electronic Signature(s) Signed: 07/13/2021 5:40:08 PM By: Deon Pilling RN, BSN Entered By: Deon Pilling on 07/13/2021 08:40:51 -------------------------------------------------------------------------------- Pain Assessment Details Patient Name: Date of Service: [name], [place]ber[place] H. 07/13/2021 8:30 A M Medical Record  Number: [id number] Patient Account Number: [id number] Date of Birth/Sex: Treating RN: 09-09-1941 (80 y.o. [name] Primary Care Yukari Flax: [name] Other Clinician: Referring Oiva Dibari: Treating Bodee Lafoe/[name]: Tressie Stalker in Treatment: 4 Active Problems Location of Pain Severity and Description of Pain Patient Has Paino Yes Site Locations Pain Location: Pain Location: Pain in Ulcers Rate the pain. Current Pain Level: 5 Worst Pain Level: 10 Least Pain Level: 0 Tolerable Pain Level: 8 Pain Management and Medication Current Pain Management: Medication: No Cold Application: No Rest: No Massage: No Activity: No T.E.N.S.: No Heat Application: No Leg drop or elevation: No Is the Current Pain Management Adequate: Adequate How does your wound impact your activities of daily livingo Sleep: No Bathing: No Appetite: No Relationship With Others: No Bladder Continence: No Emotions: No Bowel Continence: No Work: No Toileting: No Drive: No Dressing: No Hobbies: No Engineer, maintenance) Signed: 07/13/2021 5:40:08 PM By: Deon Pilling RN, BSN Entered By: Deon Pilling on 07/13/2021 08:26:55 -------------------------------------------------------------------------------- Patient/Caregiver Education Details Patient Name: Date of Service: [name], [name] 10/20/2022andnbsp8:30 A M Medical Record Number: [id number] Patient Account Number: [id number] Date of Birth/Gender: Treating RN: Sep 18, 1941 (80 y.o. [name] Primary Care Physician: [name] Other Clinician: Referring Physician: Treating Physician/[name]: Tressie Stalker in Treatment: 4 Education Assessment Education Provided To: Patient Education Topics Provided Wound/Skin Impairment: Handouts: Skin Care Do's and Dont's Methods: Explain/Verbal Responses: Reinforcements needed Electronic Signature(s) Signed:  07/13/2021 5:40:08 PM By: Deon Pilling RN, BSN Entered By: Deon Pilling on 07/13/2021 08:41:05 -------------------------------------------------------------------------------- Wound Assessment Details Patient Name: Date of Service: [name], [place]. 07/13/2021 8:30 A M Medical Record Number: [id number] Patient Account Number: [id number] Date of Birth/Sex: Treating RN: Sep 01, 1941 (80 y.o. 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 Secondary Open Surgical Wound Wounding Event: Surgical Injury Etiology: Date Acquired: 05/11/2021 Wound Open Weeks Of Treatment: 4 Status: Clustered Wound: No Comorbid Coronary Artery Disease, Deep Vein Thrombosis, Hypertension, History: Myocardial Infarction, Peripheral Arterial Disease, Type II Diabetes Photos Wound Measurements Length: (cm) 4.6 Width: (cm) 6.6 Depth: (cm) 0.5 Area: (cm) 23.845 Volume: (cm) 11.922 % Reduction in Area: 37.7% % Reduction in Volume: 65.4% Epithelialization: Small (1-33%) Tunneling: No Undermining: No Wound Description Classification: Grade 2 Wound Margin: Distinct, outline attached Exudate Amount: Medium Exudate Type: Serosanguineous Exudate Color: red, brown Foul Odor After Cleansing: No Slough/Fibrino Yes Wound Bed Granulation Amount: Large (67-100%) Exposed Structure Granulation Quality: Red Fascia Exposed: No Necrotic Amount: Small (1-33%) Fat Layer (Subcutaneous Tissue) Exposed: Yes Necrotic Quality: Adherent Slough Tendon Exposed: No Muscle Exposed: No Joint Exposed: No Bone Exposed: No Treatment Notes Wound #13 (Amputation Site - Transmetatarsal) Wound Laterality: Left Cleanser Soap and Water Discharge  Instruction: May shower and wash wound with dial antibacterial soap and water prior to dressing change. Wound Cleanser Discharge Instruction: Cleanse the wound with wound cleanser or normal saline prior to applying a clean dressing using gauze sponges, not tissue or cotton balls. Peri-Wound Care Topical Primary Dressing KerraCel Ag Gelling Fiber Dressing, 4x5 in (silver alginate) Discharge Instruction: Apply silver alginate over the santyl. Santyl Ointment Discharge Instruction: Apply nickel thick amount to wound bed as instructed Secondary Dressing Woven Gauze Sponge, Non-Sterile 4x4 in Discharge Instruction: Apply over primary dressing as directed. ABD Pad, 5x9 Discharge Instruction: Apply over primary dressing as directed. Secured With [employer], 4.5x3.1 (in/yd) Discharge Instruction: Secure with Kerlix as directed. Paper Tape, 2x10 (in/yd) Discharge Instruction: Secure dressing with tape as directed. Compression Wrap Compression Stockings Add-Ons Electronic Signature(s) Signed: 07/13/2021 5:40:08 PM By: Deon Pilling RN, BSN Signed: 07/17/2021 5:18:13 PM By: [name] RN, BSN Entered By: [name] on 07/13/2021 08:41:53 -------------------------------------------------------------------------------- Wound Assessment Details Patient Name: Date of Service: [name], [place]. 07/13/2021 8:30 A M Medical Record Number: [id number] Patient Account Number: [id number] Date of Birth/Sex: Treating RN: 08/31/1941 (80 y.o. [name], [name] Primary Care Shaneca Orne: [name] Other Clinician: Referring Leara Rawl: Treating Wendelyn Kiesling/[name]: Tressie Stalker in Treatment: 4 Wound Status Wound Number: 14 Primary Diabetic Wound/Ulcer of the Lower Extremity Etiology: Wound Location: Left, Posterior Calcaneus Wound Open Wounding Event: Pressure Injury Status: Date Acquired: 05/11/2021 Comorbid Coronary Artery Disease, Deep Vein  Thrombosis, Hypertension, Weeks Of Treatment: 4 History: Myocardial Infarction, Peripheral Arterial Disease, Type II Diabetes Clustered Wound: No Photos Wound Measurements Length: (cm) 0.5 Width: (cm) 1.3 Depth: (cm) 0.2 Area: (cm) 0.511 Volume: (cm) 0.102 % Reduction in Area: 63% % Reduction in Volume: 26.1% Epithelialization: None Tunneling: No Undermining: No Wound Description Classification: Unable to visualize wound bed Wound Margin: Flat and Intact Exudate Amount: Medium Exudate Type: Serous Exudate Color: amber Foul Odor After Cleansing: No Slough/Fibrino Yes Wound Bed Granulation Amount: Small (1-33%) Exposed Structure Granulation Quality: Pink Fascia Exposed: No Necrotic Amount: Large (67-100%) Fat Layer (Subcutaneous Tissue) Exposed: Yes Necrotic Quality: Adherent Slough Tendon Exposed: No Muscle Exposed: No Joint Exposed: No Bone Exposed: No Treatment Notes Wound #14 (Calcaneus) Wound Laterality: Left, Posterior Cleanser Soap and Water Discharge Instruction: May shower and wash wound with dial antibacterial soap and water prior to dressing change. Wound Cleanser Discharge Instruction: Cleanse the wound with wound cleanser or normal saline prior to applying a clean dressing using gauze sponges, not tissue or cotton balls. Peri-Wound Care Topical Primary Dressing KerraCel Ag  Gelling Fiber Dressing, 4x5 in (silver alginate) Discharge Instruction: Apply silver alginate over santyl. Santyl Ointment Discharge Instruction: Apply nickel thick amount to wound bed as instructed Secondary Dressing Woven Gauze Sponge, Non-Sterile 4x4 in Discharge Instruction: Apply over primary dressing as directed. ALLEVYN Heel 4 1/2in x 5 1/2in / 10.5cm x 13.5cm Discharge Instruction: Apply over primary dressing as directed. Secured With [employer], 4.5x3.1 (in/yd) Discharge Instruction: Secure with Kerlix as directed. Paper Tape, 2x10 (in/yd) Discharge  Instruction: Secure dressing with tape as directed. Compression Wrap Compression Stockings Add-Ons Electronic Signature(s) Signed: 07/13/2021 5:40:08 PM By: Deon Pilling RN, BSN Signed: 07/17/2021 5:18:13 PM By: [name] RN, BSN Entered By: [name] on 07/13/2021 08:42:28 -------------------------------------------------------------------------------- Vitals Details Patient Name: Date of Service: [name], [name] [place]. 07/13/2021 8:30 A M Medical Record Number: [id number] Patient Account Number: [id number] Date of Birth/Sex: Treating RN: 17-Sep-1941 (80 y.o. [name], [name] Primary Care Nigel Ericsson: [name] Other Clinician: Referring Falesha Schommer: Treating Lura Falor/[name]: Tressie Stalker in Treatment: 4 Vital Signs Time Taken: 08:29 Temperature (F): 97.5 Height (in): 64 Pulse (bpm): 103 Weight (lbs): 135 Respiratory Rate (breaths/min): 16 Body Mass Index (BMI): 23.2 Blood Pressure (mmHg): 170/74 Capillary Blood Glucose (mg/dl): 78 Reference Range: 80 - 120 mg / dl Electronic Signature(s) Signed: 07/13/2021 5:40:08 PM By: Deon Pilling RN, BSN Entered By: Deon Pilling on 07/13/2021 72:15:87

## 2021-07-18 ENCOUNTER — Encounter: Payer: Self-pay | Admitting: Vascular Surgery

## 2021-07-18 ENCOUNTER — Other Ambulatory Visit: Payer: Self-pay

## 2021-07-18 ENCOUNTER — Ambulatory Visit: Payer: Medicare Other | Admitting: Vascular Surgery

## 2021-07-18 ENCOUNTER — Ambulatory Visit: Payer: Medicare Other

## 2021-07-18 VITALS — BP 128/88 | HR 70 | Temp 98.8°F | Resp 20 | Ht 64.5 in | Wt 141.0 lb

## 2021-07-18 DIAGNOSIS — Z48812 Encounter for surgical aftercare following surgery on the circulatory system: Secondary | ICD-10-CM

## 2021-07-18 NOTE — Progress Notes (Signed)
BRENT, NOTO ([id number]) Visit Report for 07/06/2021 Arrival Information Details Patient Name: Date of Service: [name] 07/06/2021 9:45 A M Medical Record Number: [id number] Patient Account Number: [id number] Date of Birth/Sex: Treating RN: 1940/12/21 (80 y.o. [name] Primary Care Keats Kingry: [name] Other Clinician: Referring Garvis Downum: Treating Deva Ron/[name]: Tressie Stalker in Treatment: 3 Visit Information History Since Last Visit Added or deleted any medications: No Patient Arrived: [name] Any new allergies or adverse reactions: No Arrival Time: 10:09 Had a fall or experienced change in No Accompanied By: self activities of daily living that may affect Transfer Assistance: None risk of falls: Patient Identification Verified: Yes Signs or symptoms of abuse/neglect since No Secondary Verification Process Completed: Yes last visito Patient Requires Transmission-Based Precautions: No Hospitalized since last visit: No Patient Has Alerts: Yes Implantable device outside of the clinic No Patient Alerts: Patient on Blood Thinner excluding L ABI: 0.82 cellular tissue based products placed in the R ABI: 0.47 center since last visit: Has Dressing in Place as Prescribed: Yes Has Footwear/Offloading in Place as Yes Prescribed: Left: Wedge Shoe Right: Surgical Shoe with Pressure Relief Insole Pain Present Now: Yes Electronic Signature(s) Signed: 07/06/2021 4:48:42 PM By: Baruch Gouty RN, BSN Entered By: Baruch Gouty on 07/06/2021 10:10:07 -------------------------------------------------------------------------------- Encounter Discharge Information Details Patient Name: Date of Service: [name], [name]. 07/06/2021 9:45 A M Medical Record Number: [id number] Patient Account Number: [id number] Date of Birth/Sex: Treating RN: 08/30/1941 (80 y.o. [name] Primary Care Jasmin Trumbull: [name] Other  Clinician: Referring Loretta Kluender: Treating Johnatha Zeidman/[name]: Tressie Stalker in Treatment: 3 Encounter Discharge Information Items Post Procedure Vitals Discharge Condition: Stable Temperature (F): 97.9 Ambulatory Status: Walker Pulse (bpm): 84 Discharge Destination: Home Respiratory Rate (breaths/min): 18 Transportation: Private Auto Blood Pressure (mmHg): 133/69 Accompanied By: self Schedule Follow-up Appointment: Yes Clinical Summary of Care: Patient Declined Electronic Signature(s) Signed: 07/06/2021 4:48:42 PM By: Baruch Gouty RN, BSN Entered By: Baruch Gouty on 07/06/2021 11:42:57 -------------------------------------------------------------------------------- Lower Extremity Assessment Details Patient Name: Date of Service: [name], [name] 07/06/2021 9:45 A M Medical Record Number: [id number] Patient Account Number: [id number] Date of Birth/Sex: Treating RN: 1941/02/05 (80 y.o. [name] Primary Care Teniyah Seivert: [name] Other Clinician: Referring Porsche Noguchi: Treating Katelinn Justice/[name]: Tressie Stalker in Treatment: 3 Edema Assessment Assessed: Shirlyn Goltz: No] [Right: No] Edema: [Left: N] [Right: o] Calf Left: Right: Point of Measurement: From Medial Instep 30.5 cm Ankle Left: Right: Point of Measurement: From Medial Instep 20 cm Vascular Assessment Pulses: Dorsalis Pedis Palpable: [Left:No] Electronic Signature(s) Signed: 07/06/2021 4:48:42 PM By: Baruch Gouty RN, BSN Entered By: Baruch Gouty on 07/06/2021 10:19:18 -------------------------------------------------------------------------------- Multi Wound Chart Details Patient Name: Date of Service: [name], [name]. 07/06/2021 9:45 A M Medical Record Number: [id number] Patient Account Number: [id number] Date of Birth/Sex: Treating RN: [birth year] (80 y.o. [name], [name] Primary Care Rani Idler: [name] Other  Clinician: Referring Rodriquez Thorner: Treating Loralee Weitzman/[name]: Tressie Stalker in Treatment: 3 Vital Signs Height(in): 17 Capillary Blood Glucose(mg/dl): 48 Weight(lbs): 135 Pulse(bpm): 44 Body Mass Index(BMI): 23 Blood Pressure(mmHg): 133/69 Temperature(F): 97.9 Respiratory Rate(breaths/min): 18 Photos: [13:Left Amputation Site - Transmetatarsal Left, Posterior Calcaneus] [N/A:N/A N/A] Wound Location: [13:Surgical Injury] [14:Pressure Injury] [N/A:N/A] Wounding Event: [13:Diabetic Wound/Ulcer of the Lower] [14:Diabetic Wound/Ulcer of the Lower] [N/A:N/A] Primary Etiology: [13:Extremity Open Surgical Wound] [14:Extremity N/A] [N/A:N/A] Secondary Etiology: [13:Coronary Artery Disease, Deep Vein Coronary Artery Disease, Deep Vein N/A] Comorbid History: [13:Thrombosis, Hypertension, Myocardial Thrombosis,  Hypertension, Myocardial Infarction, Peripheral Arterial Disease, Infarction, Peripheral Arterial Disease, Type II Diabetes 05/11/2021] [14:Type II Diabetes 05/11/2021] [N/A:N/A] Date Acquired: [13:3] [14:3] [N/A:N/A] Weeks of Treatment: [13:Open] [14:Open] [N/A:N/A] Wound Status: [13:5.5x6.8x0.8] [14:1.3x1.3x0.1] [N/A:N/A] Measurements L x W x D (cm) [13:29.374] [14:1.327] [N/A:N/A] A (cm) : rea [13:23.499] [14:0.133] [N/A:N/A] Volume (cm) : [13:23.30%] [14:4.00%] [N/A:N/A] % Reduction in A [13:rea: 31.80%] [14:3.60%] [N/A:N/A] % Reduction in Volume: [13:Grade 2] [14:Unable to visualize wound bed] [N/A:N/A] Classification: [13:Medium] [14:Medium] [N/A:N/A] Exudate A mount: [13:Purulent] [14:Serous] [N/A:N/A] Exudate Type: [13:yellow, brown, green] [14:amber] [N/A:N/A] Exudate Color: [13:Distinct, outline attached] [14:Flat and Intact] [N/A:N/A] Wound Margin: [13:Large (67-100%)] [14:Small (1-33%)] [N/A:N/A] Granulation A mount: [13:Red] [14:Pink] [N/A:N/A] Granulation Quality: [13:Small (1-33%)] [14:Large (67-100%)] [N/A:N/A] Necrotic A mount: [13:Fat Layer  (Subcutaneous Tissue): Yes Fat Layer (Subcutaneous Tissue): Yes N/A] Exposed Structures: [13:Fascia: No Tendon: No Muscle: No Joint: No Bone: No Small (1-33%)] [14:Fascia: No Tendon: No Muscle: No Joint: No Bone: No None] [N/A:N/A] Epithelialization: [13:Debridement - Excisional] [14:Debridement - Excisional] [N/A:N/A] Debridement: Pre-procedure Verification/Time Out 10:55 [14:10:55] [N/A:N/A] Taken: [13:Other] [14:Other] [N/A:N/A] Pain Control: [13:Subcutaneous, Slough] [14:Subcutaneous, Slough] [N/A:N/A] Tissue Debrided: [13:Skin/Subcutaneous Tissue] [14:Skin/Subcutaneous Tissue] [N/A:N/A] Level: [13:2.4] [14:1.69] [N/A:N/A] Debridement A (sq cm): [13:rea Curette] [14:Curette] [N/A:N/A] Instrument: [13:Minimum] [14:Minimum] [N/A:N/A] Bleeding: [13:Pressure] [14:Pressure] [N/A:N/A] Hemostasis A chieved: [13:2] [14:2] [N/A:N/A] Procedural Pain: [13:0] [14:0] [N/A:N/A] Post Procedural Pain: [13:Procedure was tolerated well] [14:Procedure was tolerated well] [N/A:N/A] Debridement Treatment Response: [13:5.5x6.8x0.8] [14:1.3x1.3x0.1] [N/A:N/A] Post Debridement Measurements L x W x D (cm) [13:23.499] [14:0.133] [N/A:N/A] Post Debridement Volume: (cm) [13:Debridement] [14:Debridement] [N/A:N/A] Treatment Notes Electronic Signature(s) Signed: 07/06/2021 4:49:53 PM By: [name] MD Signed: 07/06/2021 5:46:14 PM By: Deon Pilling RN, BSN Entered By: [name] on 07/06/2021 11:17:16 -------------------------------------------------------------------------------- Multi-Disciplinary Care Plan Details Patient Name: Date of Service: [name], [name]. 07/06/2021 9:45 A M Medical Record Number: [id number] Patient Account Number: [id number] Date of Birth/Sex: Treating RN: 06-28-1941 (80 y.o. [name] Primary Care Candee Hoon: [name] Other Clinician: Referring Shelbi Vaccaro: Treating Earle Troiano/[name]: Tressie Stalker in Treatment:  3 Multidisciplinary Care Plan reviewed with physician Active Inactive Nutrition Nursing Diagnoses: Impaired glucose control: actual or potential Potential for alteratiion in Nutrition/Potential for imbalanced nutrition Goals: Patient/caregiver agrees to and verbalizes understanding of need to use nutritional supplements and/or vitamins as prescribed Date Initiated: 06/15/2021 Target Resolution Date: 07/14/2021 Goal Status: Active Patient/caregiver will maintain therapeutic glucose control Date Initiated: 06/15/2021 Target Resolution Date: 07/14/2021 Goal Status: Active Interventions: Assess HgA1c results as ordered upon admission and as needed Assess patient nutrition upon admission and as needed per policy Provide education on elevated blood sugars and impact on wound healing Provide education on nutrition Treatment Activities: Education provided on Nutrition : 06/15/2021 Notes: Wound/Skin Impairment Nursing Diagnoses: Impaired tissue integrity Knowledge deficit related to ulceration/compromised skin integrity Goals: Patient/caregiver will verbalize understanding of skin care regimen Date Initiated: 06/15/2021 Target Resolution Date: 07/14/2021 Goal Status: Active Interventions: Assess patient/caregiver ability to obtain necessary supplies Assess patient/caregiver ability to perform ulcer/skin care regimen upon admission and as needed Assess ulceration(s) every visit Provide education on ulcer and skin care Notes: Electronic Signature(s) Signed: 07/06/2021 4:48:42 PM By: Baruch Gouty RN, BSN Entered By: Baruch Gouty on 07/06/2021 10:26:40 -------------------------------------------------------------------------------- Pain Assessment Details Patient Name: Date of Service: [name], [name]. 07/06/2021 9:45 A M Medical Record Number: [id number] Patient Account Number: [id number] Date of Birth/Sex: Treating RN: 12/24/1940 (80 y.o. [name] Primary Care  Nonna Renninger: [name] Other Clinician: Referring Lewie Deman: Treating Burgandy Hackworth/[name]: [name]  [name] Weeks in Treatment: 3 Active Problems Location of Pain Severity and Description of Pain Patient Has Paino Yes Site Locations Pain Location: Pain Location: Generalized Pain, Pain in Ulcers With Dressing Change: No Duration of the Pain. Constant / Intermittento Intermittent Rate the pain. Current Pain Level: 0 Worst Pain Level: 4 Least Pain Level: 0 Character of Pain Describe the Pain: Other: nerve pain Pain Management and Medication Current Pain Management: Other: time Is the Current Pain Management Adequate: Adequate How does your wound impact your activities of daily livingo Sleep: No Bathing: No Appetite: No Relationship With Others: No Bladder Continence: No Emotions: No Bowel Continence: No Work: No Toileting: No Drive: No Dressing: No Hobbies: No Electronic Signature(s) Signed: 07/06/2021 4:48:42 PM By: Baruch Gouty RN, BSN Entered By: Baruch Gouty on 07/06/2021 10:10:52 -------------------------------------------------------------------------------- Patient/Caregiver Education Details Patient Name: Date of Service: [name] 10/13/2022andnbsp9:45 A M Medical Record Number: [id number] Patient Account Number: [id number] Date of Birth/Gender: Treating RN: Jan 26, 1941 (80 y.o. [name] Primary Care Physician: [name] Other Clinician: Referring Physician: Treating Physician/[name]: Tressie Stalker in Treatment: 3 Education Assessment Education Provided To: Patient Education Topics Provided Elevated Blood Sugar/ Impact on Healing: Methods: Explain/Verbal Responses: Reinforcements needed, State content correctly Offloading: Methods: Explain/Verbal Responses: Reinforcements needed, State content correctly Wound/Skin Impairment: Methods: Explain/Verbal Responses:  Reinforcements needed, State content correctly Electronic Signature(s) Signed: 07/06/2021 4:48:42 PM By: Baruch Gouty RN, BSN Entered By: Baruch Gouty on 07/06/2021 10:27:12 -------------------------------------------------------------------------------- Wound Assessment Details Patient Name: Date of Service: [name], [name]. 07/06/2021 9:45 A M Medical Record Number: [id number] Patient Account Number: [id number] Date of Birth/Sex: Treating RN: 1941-06-25 (80 y.o. [name] Primary Care Keaun Schnabel: [name] Other Clinician: Referring Samina Weekes: Treating Taner Rzepka/[name]: Tressie Stalker in Treatment: 3 Wound Status Wound Number: 13 Primary Diabetic Wound/Ulcer of the Lower Extremity Etiology: Wound Location: Left Amputation Site - Transmetatarsal Secondary Open Surgical Wound Wounding Event: Surgical Injury Etiology: Date Acquired: 05/11/2021 Wound Open Weeks Of Treatment: 3 Status: Clustered Wound: No Comorbid Coronary Artery Disease, Deep Vein Thrombosis, Hypertension, History: Myocardial Infarction, Peripheral Arterial Disease, Type II Diabetes Photos Wound Measurements Length: (cm) 5.5 Width: (cm) 6.8 Depth: (cm) 0.8 Area: (cm) 29.374 Volume: (cm) 23.499 % Reduction in Area: 23.3% % Reduction in Volume: 31.8% Epithelialization: Small (1-33%) Tunneling: No Undermining: No Wound Description Classification: Grade 2 Wound Margin: Distinct, outline attached Exudate Amount: Medium Exudate Type: Purulent Exudate Color: yellow, brown, green Foul Odor After Cleansing: No Slough/Fibrino Yes Wound Bed Granulation Amount: Large (67-100%) Exposed Structure Granulation Quality: Red Fascia Exposed: No Necrotic Amount: Small (1-33%) Fat Layer (Subcutaneous Tissue) Exposed: Yes Necrotic Quality: Adherent Slough Tendon Exposed: No Muscle Exposed: No Joint Exposed: No Bone Exposed: No Electronic Signature(s) Signed:  07/06/2021 4:48:42 PM By: Baruch Gouty RN, BSN Signed: 07/18/2021 2:37:05 PM By: Sandre Kitty Entered By: Sandre Kitty on 07/06/2021 10:24:35 -------------------------------------------------------------------------------- Wound Assessment Details Patient Name: Date of Service: [name], [name]. 07/06/2021 9:45 A M Medical Record Number: [id number] Patient Account Number: [id number] Date of Birth/Sex: Treating RN: 09/28/1940 (80 y.o. [name] Primary Care Alekai Pocock: [name] Other Clinician: Referring Tristian Bouska: Treating Latoyna Hird/[name]: Tressie Stalker in Treatment: 3 Wound Status Wound Number: 14 Primary Diabetic Wound/Ulcer of the Lower Extremity Etiology: Wound Location: Left, Posterior Calcaneus Wound Open Wounding Event: Pressure Injury Status: Date Acquired: 05/11/2021 Comorbid Coronary Artery Disease, Deep Vein Thrombosis, Hypertension, Weeks Of Treatment: 3 History: Myocardial Infarction, Peripheral Arterial  Disease, Type II Diabetes Clustered Wound: No Photos Wound Measurements Length: (cm) 1.3 Width: (cm) 1.3 Depth: (cm) 0.1 Area: (cm) 1.327 Volume: (cm) 0.133 % Reduction in Area: 4% % Reduction in Volume: 3.6% Epithelialization: None Tunneling: No Undermining: No Wound Description Classification: Unable to visualize wound bed Wound Margin: Flat and Intact Exudate Amount: Medium Exudate Type: Serous Exudate Color: amber Foul Odor After Cleansing: No Slough/Fibrino Yes Wound Bed Granulation Amount: Small (1-33%) Exposed Structure Granulation Quality: Pink Fascia Exposed: No Necrotic Amount: Large (67-100%) Fat Layer (Subcutaneous Tissue) Exposed: Yes Necrotic Quality: Adherent Slough Tendon Exposed: No Muscle Exposed: No Joint Exposed: No Bone Exposed: No Electronic Signature(s) Signed: 07/06/2021 4:48:42 PM By: Baruch Gouty RN, BSN Signed: 07/18/2021 2:37:05 PM By: Sandre Kitty Entered By: Sandre Kitty on 07/06/2021 10:25:38 -------------------------------------------------------------------------------- Vitals Details Patient Name: Date of Service: [name], [name]. 07/06/2021 9:45 A M Medical Record Number: [id number] Patient Account Number: [id number] Date of Birth/Sex: Treating RN: [birth year] (80 y.o. [name] Primary Care Antinette Keough: [name] Other Clinician: Referring Pantelis Elgersma: Treating Lyal Husted/[name]: Tressie Stalker in Treatment: 3 Vital Signs Time Taken: 10:05 Temperature (F): 97.9 Height (in): 64 Pulse (bpm): 84 Source: Stated Respiratory Rate (breaths/min): 18 Weight (lbs): 135 Blood Pressure (mmHg): 133/69 Source: Stated Capillary Blood Glucose (mg/dl): 72 Body Mass Index (BMI): 23.2 Reference Range: 80 - 120 mg / dl Notes glucose per pt report this am, hgb A1C 5.8 on Monday Electronic Signature(s) Signed: 07/06/2021 4:48:42 PM By: Baruch Gouty RN, BSN Entered By: Baruch Gouty on 07/06/2021 10:12:11

## 2021-07-19 DIAGNOSIS — I70202 Unspecified atherosclerosis of native arteries of extremities, left leg: Secondary | ICD-10-CM | POA: Diagnosis not present

## 2021-07-19 DIAGNOSIS — Z4801 Encounter for change or removal of surgical wound dressing: Secondary | ICD-10-CM | POA: Diagnosis not present

## 2021-07-19 DIAGNOSIS — Z4781 Encounter for orthopedic aftercare following surgical amputation: Secondary | ICD-10-CM | POA: Diagnosis not present

## 2021-07-19 DIAGNOSIS — M86172 Other acute osteomyelitis, left ankle and foot: Secondary | ICD-10-CM | POA: Diagnosis not present

## 2021-07-19 DIAGNOSIS — D638 Anemia in other chronic diseases classified elsewhere: Secondary | ICD-10-CM | POA: Diagnosis not present

## 2021-07-19 DIAGNOSIS — L89620 Pressure ulcer of left heel, unstageable: Secondary | ICD-10-CM | POA: Diagnosis not present

## 2021-07-20 ENCOUNTER — Other Ambulatory Visit: Payer: Self-pay

## 2021-07-20 ENCOUNTER — Encounter (HOSPITAL_BASED_OUTPATIENT_CLINIC_OR_DEPARTMENT_OTHER): Payer: Medicare Other | Admitting: Internal Medicine

## 2021-07-20 DIAGNOSIS — I10 Essential (primary) hypertension: Secondary | ICD-10-CM | POA: Diagnosis not present

## 2021-07-20 DIAGNOSIS — L97422 Non-pressure chronic ulcer of left heel and midfoot with fat layer exposed: Secondary | ICD-10-CM | POA: Diagnosis not present

## 2021-07-20 DIAGNOSIS — E11621 Type 2 diabetes mellitus with foot ulcer: Secondary | ICD-10-CM | POA: Diagnosis not present

## 2021-07-20 DIAGNOSIS — I2510 Atherosclerotic heart disease of native coronary artery without angina pectoris: Secondary | ICD-10-CM | POA: Diagnosis not present

## 2021-07-20 DIAGNOSIS — E11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: Secondary | ICD-10-CM | POA: Diagnosis not present

## 2021-07-20 DIAGNOSIS — L97529 Non-pressure chronic ulcer of other part of left foot with unspecified severity: Secondary | ICD-10-CM | POA: Diagnosis not present

## 2021-07-20 DIAGNOSIS — L97429 Non-pressure chronic ulcer of left heel and midfoot with unspecified severity: Secondary | ICD-10-CM | POA: Diagnosis not present

## 2021-07-20 DIAGNOSIS — L97522 Non-pressure chronic ulcer of other part of left foot with fat layer exposed: Secondary | ICD-10-CM | POA: Diagnosis not present

## 2021-07-24 DIAGNOSIS — M86172 Other acute osteomyelitis, left ankle and foot: Secondary | ICD-10-CM | POA: Diagnosis not present

## 2021-07-24 DIAGNOSIS — D638 Anemia in other chronic diseases classified elsewhere: Secondary | ICD-10-CM | POA: Diagnosis not present

## 2021-07-24 DIAGNOSIS — Z4801 Encounter for change or removal of surgical wound dressing: Secondary | ICD-10-CM | POA: Diagnosis not present

## 2021-07-24 DIAGNOSIS — I70202 Unspecified atherosclerosis of native arteries of extremities, left leg: Secondary | ICD-10-CM | POA: Diagnosis not present

## 2021-07-24 DIAGNOSIS — L89620 Pressure ulcer of left heel, unstageable: Secondary | ICD-10-CM | POA: Diagnosis not present

## 2021-07-24 DIAGNOSIS — Z4781 Encounter for orthopedic aftercare following surgical amputation: Secondary | ICD-10-CM | POA: Diagnosis not present

## 2021-07-25 NOTE — Progress Notes (Signed)
LYNITA, GROSECLOSE ([id number]) Visit Report for 07/20/2021 Cellular or Tissue Based Product Details Patient Name: Date of Service: [name] 07/20/2021 9:00 A M Medical Record Number: [id number] Patient Account Number: [id number] Date of Birth/Sex: Treating RN: [birth year] (80 y.o. [name] Primary Care Provider: [name] Other Clinician: Referring Provider: Treating Provider/[name]: Tressie Stalker in Treatment: 5 Cellular or Tissue Based Product Type Wound #13 Left Amputation Site - Transmetatarsal Applied to: Performed By: Physician Ricard Dillon., MD Cellular or Tissue Based Product Type: Apligraf Level of Consciousness (Pre-procedure): Awake and Alert Pre-procedure Verification/Time Out Yes - 10:05 Taken: Location: genitalia / hands / feet / multiple digits Wound Size (sq cm): 30.55 Product Size (sq cm): 40 Waste Size (sq cm): 0 Amount of Product Applied (sq cm): 40 Instrument Used: Forceps, Scissors Lot #: [id number] Order #: 1 Expiration Date: 07/27/2021 Fenestrated: Yes Instrument: Blade Reconstituted: Yes Solution Type: SALINE Solution Amount: 3 ml Lot #: [id number] Solution Expiration Date: 05/25/2022 Secured: Yes Secured With: Steri-Strips Dressing Applied: Yes Primary Dressing: adaptic Procedural Pain: 0 Post Procedural Pain: 0 Response to Treatment: Procedure was tolerated well Level of Consciousness (Post- Awake and Alert procedure): Post Procedure Diagnosis Same as Pre-procedure Electronic Signature(s) Signed: 07/25/2021 4:29:54 PM By: [name] MD Entered By: [name] on 07/20/2021 10:31:07 -------------------------------------------------------------------------------- Cellular or Tissue Based Product Details Patient Name: Date of Service: [name], [place]. 07/20/2021 9:00 A M Medical Record Number: [id number] Patient Account Number: [id number] Date of Birth/Sex: Treating  RN: Dec 20, 1940 (80 y.o. [name] Primary Care Provider: [name] Other Clinician: Referring Provider: Treating Provider/[name]: Tressie Stalker in Treatment: 5 Cellular or Tissue Based Product Type Wound #14 Left,Posterior Calcaneus Applied to: Performed By: Physician Ricard Dillon., MD Cellular or Tissue Based Product Type: Apligraf Level of Consciousness (Pre-procedure): Awake and Alert Pre-procedure Verification/Time Out Yes - 10:05 Taken: Location: genitalia / hands / feet / multiple digits Wound Size (sq cm): 0.7 Product Size (sq cm): 4 Waste Size (sq cm): 0 Amount of Product Applied (sq cm): 4 Instrument Used: Forceps, Scissors Lot #: [id number] Order #: 1 Expiration Date: 07/27/2021 Fenestrated: Yes Instrument: Blade Reconstituted: Yes Solution Type: SALINE Solution Amount: 3 ml Lot #: [id number] Solution Expiration Date: 05/25/2022 Secured: Yes Secured With: Steri-Strips Dressing Applied: Yes Primary Dressing: adaptic Procedural Pain: 0 Post Procedural Pain: 0 Response to Treatment: Procedure was tolerated well Level of Consciousness (Post- Awake and Alert procedure): Post Procedure Diagnosis Same as Pre-procedure Electronic Signature(s) Signed: 07/25/2021 4:29:54 PM By: [name] MD Entered By: [name] on 07/20/2021 10:31:28 -------------------------------------------------------------------------------- HPI Details Patient Name: Date of Service: [name], [place]. 07/20/2021 9:00 A M Medical Record Number: [id number] Patient Account Number: [id number] Date of Birth/Sex: Treating RN: 28-May-1941 (80 y.o. [name] Primary Care Provider: [name] Other Clinician: Referring Provider: Treating Provider/[name]: Tressie Stalker in Treatment: 5 History of Present Illness HPI Description: ADMISSION 07/13/2019 Patient is a 80 year old type II  diabetic. She has known PAD. She has been followed by Dr. Jacqualyn Posey of podiatry for blistering areas on her toes dating back to 04/30/2019 which at this was the left fourth toe. By 8/11 she had wounds on the right and left second toes. She underwent arterial studies by Dr. Alvester Chou on 7/31 that showed ABIs on the right of 0.60 TBI of 0.26 on the left ABI of 0.56 and a TBI of 0.25. By 9/10 she had ischemic-looking wounds  per Dr. Earleen Newport on the right first, left first second and third. She has been using Medihoney and then mupirocin more recently simply Betadine. The patient underwent angiography by Dr. Gwenlyn Found on 9/21. 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 Noted that there was 1 vessel runoff in the foot via the peroneal. On the left there was an 80% left SFA, 70% mid left SFA. There was a short segment calcified CTO distal less than SFA and above-knee popliteal artery reconstituting with two-vessel runoff. 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 It was felt that she had bilateral SFA disease as well as tibial vessel disease. An attempt was made to revascularize the left SFA but they were unable to cross because of the highly calcified nature of the lesion. 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 She has an area on the medial left fourth toe with raised horned callus on top of this. I am not certain what this represents. With regards to pain she has about a 2-1/2 I will claudication tolerance in the grocery store. She has some pain in night which is relieved by putting her feet down over the side of the bed. She is wearing Nitro-Dur patches on the top of her right foot. Past medical history includes type 2 diabetes with secondary PAD, neoplasm of the skin, diabetic  retinopathy, carotid artery stenosis, hyperlipidemia hypertension. 11/2; the last time the patient was here I spoke to Dr. Gwenlyn Found about revascularization options on the right. As I understand things currently Dr. Gwenlyn Found spoke with Dr. Trula Slade and ultimately the patient was taken to surgery on 10/27. She had a right iliofemoral endarterectomy with a bovine patch angioplasty. I think the plan now is for her to have a staged intervention on the right SFA by Dr. Alvester Chou. Per the patient's understanding Dr. Gwenlyn Found and Dr. Jacqualyn Posey are waiting to see when the gangrenous toes on the right foot can be amputated. The patient states her pain is a lot better and she is grateful for that. 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 Small area on the left fourth toe. She is using Betadine to the mummified areas on the right and Medihoney on the left 08/27/2019; the last time I saw this patient I discharged her from the clinic. She had been revascularized by Dr. Trula Slade and she had a right iliofemoral endarterectomy with a bovine angioplasty. 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 Jacqualyn Posey of podiatry on 08/07/2019. I note that she also had a intervention by Dr. Gwenlyn Found and he performed a directional atherectomy and drug-coated balloon angioplasty of the SFA and popliteal artery on the right. I am not certain of the date of Dr. Kennon Holter procedure as of the time of this dictation. She was referred back to [place] by Dr. Earleen Newport predominantly for follow-up of the left fourth toe. She still has sutures and stitches in the right TMA site. She states her pain is a lot better. She expresses concern about the condition of the amputation site at the TMA. She is on doxycycline I think prescribed by Dr. Earleen Newport. She is complaining of some pain at night 12/10; I spoke to Dr. Jacqualyn Posey last week. He  removed the sutures on the right foot on Monday of this week.  She has the area on the left fourth toe just proximal to the PIP and then the right TMA site. She is still on doxycycline and has enough through next week. Unfortunately the TMA site does not look good at all. Both on the lateral and medial part of the incisions are areas that probe to bone. There is purulence over the medial part which I have cultured. We will use silver alginate. Left fourth toe looks somewhat better but there was still exposed bone 12/17; patient has an MRI booked for 12/30. Culture I did last week showed Pseudomonas Serratia and Enterococcus. This was purulent drainage coming out of the medial part of her amputation site. I use cefdinir 300 twice daily for 10 days that started on 12/15. Her x-ray on the right showed limited evaluation for osteomyelitis. The findings could have been postoperative. There was subtle erosion in the distal first and distal fifth metatarsal. An MRI was suggested. On the left she had irregularity of the fourth middle and proximal phalanx consistent with a history of osteomyelitis. I do not know that she has a history of osteomyelitis in this area. She had a newly defined area on the plantar third toe 12/31; patient's MRI is listed below: MRI OF THE RIGHT FOREFOOT WITHOUT AND WITH CONTRAST TECHNIQUE: Multiplanar, multisequence MR imaging of the right forefoot was performed before and after the administration of intravenous contrast. CONTRAST 6 mL GADAVIST IV SOLN : COMPARISON: Plain films right foot 09/04/2019. FINDINGS: Bones/Joint/Cartilage The patient is status post transmetatarsal amputation as seen on the prior exam. Marrow edema and enhancement are seen in all of the distal metatarsals. In the first metatarsal, signal change extends 1.5 cm proximal to the stump and in the second metatarsal extends approximately 2 cm proximal to the stump. Edema and enhancement are seen in only the distal 0.7 cm of the third metatarsal stump and tips of  the fourth and fifth metatarsals. Bone marrow signal is otherwise unremarkable. A small focus of subchondral edema is seen in the lateral talus, likely degenerative. Ligaments Intact. Muscles and Tendons No intramuscular fluid collection. Soft tissues Skin ulceration is seen off the stump of the first metatarsal. A thin fluid collection tracks deep to the wound and over the anterior metatarsals worrisome for small abscess. Intense subcutaneous edema and enhancement are seen diffusely. 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 Cellulitis about the foot. Skin ulceration over the distal first metatarsal is identified with a thin fluid collection tracking anteriorly along the stump worrisome for abscess. Small focus of subchondral edema in the lateral talus is likely degenerative. Electronically Signed By: Inge Rise M.D. On: 09/23/2019 15:25 Patient arrives in clinic today not complaining any of any pain. 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 She does not describe claudication however her activity is very limited. 10/01/2019; since the patient was last here I have communicated with Dr. Gwenlyn Found after bypass by Dr. Trula Slade and addressing the right superficial femoral artery he states that she is widely patent through peroneal arteries to the ankle with collaterals to the dorsalis pedis. He states he is going to talk to colleagues about the feasibility of tibial pedal access. The patient seems infectious disease later this afternoon Dr. Baxter Flattery. in preparation for this she  has been off antibiotics for 1 week and I went ahead and obtain pieces of the remanence of her first metatarsal for pathology and CandS. The patient is a candidate for hyperbaric oxygen. 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 1/15; considerable improvement in the wounds on her feet. We are using silver collagen. She follows with Dr. Baxter Flattery of infectious disease and is on meropenem and daptomycin. She has been taught how to do this herself at home. Follows with Dr. Graylon Good at the end of treatment here. She has 2 wounds on the surgical TMA site 1 lateral and 1 medial the lateral 1 has regressed tremendously. The area medially still has some exposed bone although the base of this looks healthy. 1/22; 2 separate wounds on the surgical TMA site. Both of these look satisfactory. The medial area does not have any exposed bone. This is an improvement On the left side fourth toe dorsally over the proximal phalanx there is a deep punched out area that probes to bone. She has an area on the tip of the left third toe. She also tells [place] that in HBO she traumatized her left Achilles and this is left her with a superficial wound area 1/28; weekly visit along with HBO. She has 5 wounds. T our punched out areas on the original TMA site on the right. Both of these appear to have contracted. o The area on the right no longer has exposed bone. She has an area on the tip of the left third toe and on the DIP of the left fourth toe. Both of these had surface callus that I removed and unfortunately they have small areas that both go to bone. She has a traumatic wound on the left Achilles area that happened in HBO and that appears better. She is tolerating her IV antibiotics well at home. She has home health changing the dressings and she is doing it once on the weekends. We have been using silver collagen. She has been extensively revascularized on the right by Dr. Trula Slade and subsequently by Dr. Gwenlyn Found. According to her she has severe PAD on the left but there was nothing that could be done to revascularize this I will need to review these notes 2/5; the patient will  see Dr. Baxter Flattery of infectious disease on 2/17. Dr. Gwenlyn Found on 2/23 and according to her her antibiotics stopped on 2/23. She is tolerating hyperbarics well. She has made a tremendous improvement in the right forefoot with only 2 smaller open wounds. Using silver collagen. On the left foot she has the area on the tip of the left third toe and the medial part of the left fourth toe. These had exposed bone last week I did not sense any of that today 2/12; sees Dr. Graylon Good on 2/17 and Dr. Gwenlyn Found on 2/23. We are going to use Dermagraft on this today however the lateral part of her train TMA incision on the right is healed and the medial part is down so much that we just continue with the silver collagen She has wounds on the tip of her left third and the medial aspect of the left fourth. Both of these still have exposed bone I have not been able to get these to epithelialize. 2/19; she sees Dr. Gwenlyn Found on 2/23 and I have communicated with him about the left vascular supply. Looking at her angiogram from 08/03/2019 it looks as though that they could not cross the left CFA.  Noted that she had one-vessel runoff bilaterally. She also apparently saw Dr. Graylon Good although I did not look up these results for preparation of this record. 2/26; Dr. Gwenlyn Found is going to do an angiogram next week on Wednesday. I think they are going to try to go at this both retrograde and anterograde to see if anything can be done to revascularize the left lower limb. She continues to make nice progress on the remaining wound on the right medial foot on her TMA site and the toe it wounds on the left are responding nicely as well 3/12; the patient underwent an extensive and complicated revascularization on the left leg by Dr. Fletcher Anon on 3/3; she had an atherectomy on occluded left popliteal artery; anterior tibial artery followed by a balloon angioplasty. Atherectomy was also performed to the left SFA because there was still significant 50% stenosis  in the left popliteal artery they performed an intravascular lithotripsy which improved the residual stenosis to 20%. The same lithotripsy was used to dilate the proximal left SFA. She had a drug-eluting stent placed I believe in the SFA. The patient returned for hyperbarics this week. She has had some eye problems on the right which she tells me are secondary to diabetic retinopathy and she saw her eye doctor. She is really made excellent progress there is no open wounds on the left foot at all. She has one open area medially and her TMA site on the right lateral wound is closed. 3/19; the patient comes in today with the area on the medial transmet on the right improved. Some debris removed from the surface revealed the still open wound. Unfortunately she now has the open area on the third toe tip and the medial aspect of the dorsal fourth toe. These are in the same location as her previous wounds. These had actually closed up last week. 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 She is undergone complex revascularizations on the right by Dr. Trula Slade and Dr. Gwenlyn Found and I believe Dr. 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 She completed IVantibiotics as directed by infectious disease and I believe a course of oral antibiotics as well.she underwent a complete 40 treatments of hyperbarics. She comes in today with the vast majority of the right TMA site healed there is a small opening on the most medial aspect. Her left third and left fourth toes appear to have epithelialization although this has fluctuated somewhat. 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 She had a gaping wound on the right  transmetatarsal amputation L as well as deep wounds on the left third and left fourth toes. She underwent complex revascularizations on her bilateral lower legs which I described on her last visit. She completed IV antibiotics by infectious disease and 40 treatments of hyperbarics. Her left third and fourth toes are healed. The area on the right transmetatarsal amputation site is also closed today. READMISSION 08/04/2020 [name] is now a 80 year old woman that we had for a complex stay in our clinic from October 2000 14 January 2020. 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 Since she was last in clinic she had an MRI in June of  this year and underwent a CABG on 03/24/20. Her current problems with wounds began on October 30 when she developed a spontaneous opening over the right transmet medial aspect over the first metatarsal head. She has been using collagen on this that she had leftover from her last stay in this clinic. She also has had an open area on the medial right calf from a vein harvest site from her CABG she. She said that this is never really closed since the vein harvest was done. With regards to her arterial status this is a major issue. She had an angiogram by Dr. Gwenlyn Found in September 2020. She developed a gangrenous right first and second toes. Ultimately she was referred to Dr. Trula Slade who did a right common femoral endarterectomy with patch angioplasty. This was on 07/21/2019 and she really had a good result. Dr. Gwenlyn Found did a atherectomy followed by a drug coated balloon angioplasty of her right SFA popliteal and tibial peroneal trunk on 08/03/2019 again with excellent excellent results. Follow-up.Dopplers in November revealed revealed a widely patent SFA and tibioperoneal trunk. UNFORTUNATELY her recent angiogram that was done on 08/01/2020 showed an 80% right common femoral artery stenosis just above the previous endarterectomy site  occluded right right SFA at the origin reconstituting the adductor canal by the profunda femoris collaterals. The profunda femoris is also diffusely diseased. There is and again another 80% tibioperoneal trunk stenosis and one-vessel runoff via the peroneal artery. She is seen Dr. Donzetta Matters and she is scheduled for a femoral endarterectomy and right femoral-popliteal bypass grafting with endarterectomy of her tibial peroneal trunk. The scheduled surgery is on 11/23 The patient does not complain of pain at rest. She does have 5-minute walking claudication. The wounds on her right first met head remanent was spontaneous she does not think she hit this. As mentioned in the area on her right medial calf is a vein harvest site Dr. Gwenlyn Found had texted me about getting her in the clinic and we have arranged this as soon as we can. I have told her that we will have to see how successful Dr. Donzetta Matters is before we know what can be done about the wounds in a precise fashion. Fortunately there does not seem to be any obvious infection involving the right foot although that may need to be looked at if things really deteriorate. She was treated with IV antibiotics and hyperbaric oxygen for her previous osteomyelitis 12/2; the patient underwent a right femoral endarterectomy and bypass femoral to popliteal artery. This was done on 08/16/2020. She is done remarkably well. She has a small area on the right anterior tibia and a small area in the medial part of her original right transmetatarsal amputation. She appears to have had an excellent response to surgery. 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 Everly is a now 80 year old woman who arrives for review of wounds on the left TMA site extending into her plantar foot as well as an area on the left posterior calcaneus. We had her extensively  in 2021 for a failed right TMA in the setting of type 2 diabetes with angiopathy and underlying osteomyelitis. She was extensively revascularized on the right at that time as noted above in our previous notes ended up receiving IV antibiotics and hyperbaric oxygen therapy. Miraculously this TMA site actually closed and she really has had not any trouble with this  since. UNFORTUNATELY the same cannot be said of her left side. Her problem apparently started sometime in July when she hit her left third toe on a barstool while playing pool. She developed an acute infection which eventually led to underlying osteomyelitis. She underwent an extensive hospitalization from 04/27/2021 through 05/15/2021. She received IV antibiotics. Also extensive intervention by Dr. Stanford Breed of vein and vascular which included a left common femoral to tibioperoneal trunk bypass as well as a left third toe amputation on 05/03/2021. Unfortunately the area did not heal she ended up requiring a left TMA. By her description this was left open. She has been using a wound VAC on this to earlier this week and then was switched to Santyl. I think because of the left forefoot off loader she is also developed an area on the left posterior calcaneus. She is not complaining of a lot of pain. She has her daughter at home to change the dressings. She has been prescribed Santyl and she has a tube that she is brought into our clinic. I cannot get a lot of history of claudication although she is really not that mobile at this point walking with a walker. 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 She has had multiple interventions on the right side by vein and vascular and also by Dr. Gwenlyn Found as well as a transmetatarsal amputation on the right. She has had follow-up noninvasive studies on 06/06/2021; on the left  that showed an ABI of 0.47 with monophasic waveforms a dorsalis pedis ABI of 0.40 with monophasic waveforms. Her ANGIOGRAM which was initially done by Dr. Oneida Alar on 04/28/2021 showed severe left foot superficial femoral artery occlusive disease and an occlusion of the left popliteal artery with two-vessel runoff to the left foot with moderate to severe disease below the knee popliteal artery and tibial disease. She had 80% distal right external iliac artery above the existing femoral above-knee popliteal bypass but she does not have an open wound on the right foot She arrives in clinic with an extensive open area on the TMA site which in the mid aspect extends into the plantar foot. She also has a more superficial area on the upper part of her Achilles area of the left heel 9/29; patient arrives in clinic today with slough over the TMA site. We use MolecuLight to look at the surface of this suggesting cyan and white discoloration over a large part of this wound also extending into the third metatarsal area. She also has the area on the left posterior calcaneus. This is slough covered we will switch to [facility] here as well. There is some warmth in the area and some erythema we will give her antibiotics as well 10/6; completely necrotic surface once again on the left TMA site. We have been using Santyl and Hydrofera Blue. She has an area on the left posterior Achilles and actually states this has been more painful this week indeed there is some erythema around this area. She also describes I think claudication at rest which is relieved by dipping her left leg over the side of the bed at night. 10/13; our intake nurse was able to brush some slough off the surface of the left TMA hence this did not require debridement that is better than last week. The area heading towards the plantar part of her foot required an aggressive debridement to  clean this up. This is quite a sizable divot but does not go to bone.  The area on the right Achilles heel is also covered in a high 100% nonviable tissue we have been using Santyl and silver alginate 10/20; the area on the left TMA looks much better. Even the area is spreading into the midfoot looks improved. The right Achilles still is covered and nonviable surface 100%. We have been using Santyl and silver alginate and this will continue for this week The patient is approved for 5 Apligraf's and I think she is good enough to start using these next week 10/27; the patient's wounds actually look quite good. We have been using Santyl and silver alginate. We applied Apligraf #1 today Saw Dr. Stanford Breed her vascular surgeon on 10/25 he told her TMA was healing nicely. She will follow-up with them in December to repeat noninvasive testing. Electronic Signature(s) Signed: 07/25/2021 4:29:54 PM By: [name] MD Entered By: [name] on 07/20/2021 10:32:25 -------------------------------------------------------------------------------- Physical Exam Details Patient Name: Date of Service: [name], [name] 07/20/2021 9:00 A M Medical Record Number: [id number] Patient Account Number: [id number] Date of Birth/Sex: Treating RN: 1941/06/16 (80 y.o. [name] Primary Care Provider: [name] Other Clinician: Referring Provider: Treating Provider/[name]: Tressie Stalker in Treatment: 5 Constitutional Sitting or standing Blood Pressure is within target range for patient.. Pulse regular and within target range for patient.Marland Kitchen Respirations regular, non-labored and within target range.. Temperature is normal and within the target range for the patient.Marland Kitchen Appears in no distress. Notes Wound exam; substantial wound of the left TMA extending into the midfoot plantar aspect. Surfaces of these wounds looks good. Vigorously debrided with Anasept and gauze Apligraf #1 applied Electronic Signature(s) Signed: 07/25/2021 4:29:54 PM By:  [name] MD Entered By: [name] on 07/20/2021 10:33:15 -------------------------------------------------------------------------------- Physician Orders Details Patient Name: Date of Service: [name], [place]. 07/20/2021 9:00 A M Medical Record Number: [id number] Patient Account Number: [id number] Date of Birth/Sex: Treating RN: 08/09/1941 (80 y.o. [name] Primary Care Provider: [name] Other Clinician: Referring Provider: Treating Provider/[name]: Tressie Stalker in Treatment: 5 Verbal / Phone Orders: No Diagnosis Coding Follow-up Appointments ppointment in 1 week. - with Dr. Dellia Nims Return A Cellular or Tissue Based Products Cellular or Tissue Based Product Type: - Apligraf #1 applied 07/20/21 daptic or Mepitel. (DO NOT REMOVE). - May Cellular or Tissue Based Product applied to wound bed, secured with steri-strips, cover with A change secondary dressing only as needed for drainage. DO NOT REMOVE steristrips or clean wound bed Bathing/ Shower/ Hygiene May shower with protection but do not get wound dressing(s) wet. Off-Loading Open toe surgical shoe to: - right foot Wedge shoe to: - left foot Home Health No change in wound care orders this week; continue Home Health for wound care. May utilize formulary equivalent dressing for wound treatment orders unless otherwise specified. New wound care orders this week; continue Home Health for wound care. May utilize formulary equivalent dressing for wound treatment orders unless otherwise specified. - apligraf applied. May change secondary dressing only as needed for drainage Other Home Health Orders/Instructions: - [facility] 3x a week Wound Treatment Wound #13 - Amputation Site - Transmetatarsal Wound Laterality: Left Cleanser: Soap and Water 1 x Per Week/7 Days Discharge Instructions: May shower and wash wound with dial antibacterial soap and water prior to dressing  change. Cleanser: Wound Cleanser (Home Health) 1 x Per Week/7 Days Discharge Instructions: Cleanse the  wound with wound cleanser or normal saline prior to applying a clean dressing using gauze sponges, not tissue or cotton balls. Prim Dressing: Apligraf ary 1 x Per Week/7 Days Secondary Dressing: Woven Gauze Sponge, Non-Sterile 4x4 in (Home Health) 1 x Per Week/7 Days Discharge Instructions: Apply over primary dressing as directed. Secondary Dressing: ABD Pad, 5x9 (Home Health) 1 x Per Week/7 Days Discharge Instructions: Apply over primary dressing as directed. Secondary Dressing: ADAPTIC TOUCH 3x4.25 in 1 x Per Week/7 Days Discharge Instructions: Apply over primary dressing and secured with steristrips Secured With: Kerlix Roll Sterile, 4.5x3.1 (in/yd) (Home Health) 1 x Per Week/7 Days Discharge Instructions: Secure with Kerlix as directed. Secured With: Paper Tape, 2x10 (in/yd) (Home Health) 1 x Per Week/7 Days Discharge Instructions: Secure dressing with tape as directed. Wound #14 - Calcaneus Wound Laterality: Left, Posterior Cleanser: Soap and Water 1 x Per Week/7 Days Discharge Instructions: May shower and wash wound with dial antibacterial soap and water prior to dressing change. Cleanser: Wound Cleanser (Home Health) 1 x Per Week/7 Days Discharge Instructions: Cleanse the wound with wound cleanser or normal saline prior to applying a clean dressing using gauze sponges, not tissue or cotton balls. Prim Dressing: Apligraf ary 1 x Per Week/7 Days Secondary Dressing: Woven Gauze Sponge, Non-Sterile 4x4 in (Home Health) 1 x Per Week/7 Days Discharge Instructions: Apply over primary dressing as directed. Secondary Dressing: ALLEVYN Heel 4 1/2in x 5 1/2in / 10.5cm x 13.5cm (Home Health) 1 x Per Week/7 Days Discharge Instructions: Apply over primary dressing as directed. Secondary Dressing: ADAPTIC TOUCH 3x4.25 in 1 x Per Week/7 Days Discharge Instructions: Apply over primary dressing  and secured with steristrips Secured With: Kerlix Roll Sterile, 4.5x3.1 (in/yd) (Home Health) 1 x Per Week/7 Days Discharge Instructions: Secure with Kerlix as directed. Secured With: Paper Tape, 2x10 (in/yd) (Home Health) 1 x Per Week/7 Days Discharge Instructions: Secure dressing with tape as directed. Electronic Signature(s) Signed: 07/20/2021 5:29:08 PM By: Baruch Gouty RN, BSN Signed: 07/25/2021 4:29:54 PM By: [name] MD Entered By: Baruch Gouty on 07/20/2021 09:57:09 -------------------------------------------------------------------------------- Problem List Details Patient Name: Date of Service: [name], [place]. 07/20/2021 9:00 A M Medical Record Number: [id number] Patient Account Number: [id number] Date of Birth/Sex: Treating RN: [birth year] (80 y.o. [name], [name] Primary Care Provider: [name] Other Clinician: Referring Provider: Treating Provider/[name]: Tressie Stalker in Treatment: 5 Active Problems ICD-10 Encounter Code Description Active Date MDM Diagnosis E11.621 Type 2 diabetes mellitus with foot ulcer 06/15/2021 No Yes T81.31XD Disruption of external operation (surgical) wound, not elsewhere classified, 06/15/2021 No Yes subsequent encounter E11.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 06/15/2021 No Yes L97.528 Non-pressure chronic ulcer of other part of left foot with other specified 06/15/2021 No Yes severity L97.428 Non-pressure chronic ulcer of left heel and midfoot with other specified 06/15/2021 No Yes severity Inactive Problems Resolved Problems Electronic Signature(s) Signed: 07/25/2021 4:29:54 PM By: [name] MD Entered By: [name] on 07/20/2021 10:30:20 -------------------------------------------------------------------------------- Progress Note Details Patient Name: Date of Service: [name], [place]. 07/20/2021 9:00 A M Medical Record  Number: [id number] Patient Account Number: [id number] Date of Birth/Sex: Treating RN: 09-Jul-1941 (80 y.o. [name] Primary Care Provider: [name] Other Clinician: Referring Provider: Treating Provider/[name]: Tressie Stalker in Treatment: 5 Subjective History of Present Illness (HPI) ADMISSION 07/13/2019 Patient is a 80 year old type II diabetic. She has known PAD. She has been followed by Dr. Jacqualyn Posey of podiatry  for blistering areas on her toes dating back to 04/30/2019 which at this was the left fourth toe. By 8/11 she had wounds on the right and left second toes. She underwent arterial studies by Dr. Alvester Chou on 7/31 that showed ABIs on the right of 0.60 TBI of 0.26 on the left ABI of 0.56 and a TBI of 0.25. By 9/10 she had ischemic-looking wounds per Dr. Earleen Newport on the right first, left first second and third. She has been using Medihoney and then mupirocin more recently simply Betadine. The patient underwent angiography by Dr. Gwenlyn Found on 9/21. 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 Noted that there was 1 vessel runoff in the foot via the peroneal. ooOn the left there was an 80% left SFA, 70% mid left SFA. There was a short segment calcified CTO distal less than SFA and above-knee popliteal artery reconstituting with two-vessel runoff. 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 It was felt that she had bilateral SFA disease as well as tibial vessel disease. An attempt was made to revascularize the left SFA but they were unable to cross because of the highly calcified nature of the lesion. 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 She has an area on the medial left fourth toe with raised horned callus on top  of this. I am not certain what this represents. With regards to pain she has about a 2-1/2 I will claudication tolerance in the grocery store. She has some pain in night which is relieved by putting her feet down over the side of the bed. She is wearing Nitro-Dur patches on the top of her right foot. Past medical history includes type 2 diabetes with secondary PAD, neoplasm of the skin, diabetic retinopathy, carotid artery stenosis, hyperlipidemia hypertension. 11/2; the last time the patient was here I spoke to Dr. Gwenlyn Found about revascularization options on the right. As I understand things currently Dr. Gwenlyn Found spoke with Dr. Trula Slade and ultimately the patient was taken to surgery on 10/27. She had a right iliofemoral endarterectomy with a bovine patch angioplasty. I think the plan now is for her to have a staged intervention on the right SFA by Dr. Alvester Chou. Per the patient's understanding Dr. Gwenlyn Found and Dr. Jacqualyn Posey are waiting to see when the gangrenous toes on the right foot can be amputated. The patient states her pain is a lot better and she is grateful for that. 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 Small area on the left fourth toe. She is using Betadine to the mummified areas on the right and Medihoney on the left 08/27/2019; the last time I saw this patient I discharged her from the clinic. She had been revascularized by Dr. Trula Slade and she had a right iliofemoral endarterectomy with a bovine angioplasty. 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 Jacqualyn Posey of podiatry on 08/07/2019. I note that she also had a intervention by Dr. Gwenlyn Found and he performed a directional atherectomy and drug-coated balloon angioplasty of the SFA and popliteal artery on the right. I am not certain of the date of Dr. Kennon Holter procedure as of the time of this dictation. She was referred back to [place] by Dr. Earleen Newport  predominantly for follow-up  of the left fourth toe. She still has sutures and stitches in the right TMA site. She states her pain is a lot better. She expresses concern about the condition of the amputation site at the TMA. She is on doxycycline I think prescribed by Dr. Earleen Newport. She is complaining of some pain at night 12/10; I spoke to Dr. Jacqualyn Posey last week. He removed the sutures on the right foot on Monday of this week. She has the area on the left fourth toe just proximal to the PIP and then the right TMA site. She is still on doxycycline and has enough through next week. Unfortunately the TMA site does not look good at all. Both on the lateral and medial part of the incisions are areas that probe to bone. There is purulence over the medial part which I have cultured. We will use silver alginate. Left fourth toe looks somewhat better but there was still exposed bone 12/17; patient has an MRI booked for 12/30. Culture I did last week showed Pseudomonas Serratia and Enterococcus. This was purulent drainage coming out of the medial part of her amputation site. I use cefdinir 300 twice daily for 10 days that started on 12/15. Her x-ray on the right showed limited evaluation for osteomyelitis. The findings could have been postoperative. There was subtle erosion in the distal first and distal fifth metatarsal. An MRI was suggested. On the left she had irregularity of the fourth middle and proximal phalanx consistent with a history of osteomyelitis. I do not know that she has a history of osteomyelitis in this area. She had a newly defined area on the plantar third toe 12/31; patient's MRI is listed below: MRI OF THE RIGHT FOREFOOT WITHOUT AND WITH CONTRAST TECHNIQUE: Multiplanar, multisequence MR imaging of the right forefoot was performed before and after the administration of intravenous contrast. CONTRAST 6 mL GADAVIST IV SOLN : COMPARISON: Plain films right foot 09/04/2019. FINDINGS: Bones/Joint/Cartilage The patient  is status post transmetatarsal amputation as seen on the prior exam. Marrow edema and enhancement are seen in all of the distal metatarsals. In the first metatarsal, signal change extends 1.5 cm proximal to the stump and in the second metatarsal extends approximately 2 cm proximal to the stump. Edema and enhancement are seen in only the distal 0.7 cm of the third metatarsal stump and tips of the fourth and fifth metatarsals. Bone marrow signal is otherwise unremarkable. A small focus of subchondral edema is seen in the lateral talus, likely degenerative. Ligaments Intact. Muscles and Tendons No intramuscular fluid collection. Soft tissues Skin ulceration is seen off the stump of the first metatarsal. A thin fluid collection tracks deep to the wound and over the anterior metatarsals worrisome for small abscess. Intense subcutaneous edema and enhancement are seen diffusely. 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 Cellulitis about the foot. Skin ulceration over the distal first metatarsal is identified with a thin fluid collection tracking anteriorly along the stump worrisome for abscess. Small focus of subchondral edema in the lateral talus is likely degenerative. Electronically Signed By: Inge Rise M.D. On: 09/23/2019 15:25 Patient arrives in clinic today not complaining any of any pain. 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 She does not describe claudication however her activity is very limited. 10/01/2019; since the patient was last here I  have communicated with Dr. Gwenlyn Found after bypass by Dr. Trula Slade and addressing the right superficial femoral artery he states that she is widely patent through peroneal arteries to the ankle with collaterals to the  dorsalis pedis. He states he is going to talk to colleagues about the feasibility of tibial pedal access. The patient seems infectious disease later this afternoon Dr. Baxter Flattery. in preparation for this she has been off antibiotics for 1 week and I went ahead and obtain pieces of the remanence of her first metatarsal for pathology and CandS. The patient is a candidate for hyperbaric oxygen. 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 1/15; considerable improvement in the wounds on her feet. We are using silver collagen. She follows with Dr. Baxter Flattery of infectious disease and is on meropenem and daptomycin. She has been taught how to do this herself at home. Follows with Dr. Graylon Good at the end of treatment here. She has 2 wounds on the surgical TMA site 1 lateral and 1 medial the lateral 1 has regressed tremendously. The area medially still has some exposed bone although the base of this looks healthy. 1/22; 2 separate wounds on the surgical TMA site. Both of these look satisfactory. The medial area does not have any exposed bone. This is an improvement On the left side fourth toe dorsally over the proximal phalanx there is a deep punched out area that probes to bone. She has an area on the tip of the left third toe. She also tells [place] that in HBO she traumatized her left Achilles and this is left her with a superficial wound area 1/28; weekly visit along with HBO. She has 5 wounds. T our punched out areas on the original TMA site on the right. Both of these appear to have contracted. o The area on the right no longer has exposed bone. She has an area on the tip of the left third toe and on the DIP of the left fourth toe. Both of these had surface callus that I removed and unfortunately they have small areas that both go to bone. She has a traumatic wound on the left Achilles area that happened in HBO and that appears better. She is tolerating her IV antibiotics  well at home. She has home health changing the dressings and she is doing it once on the weekends. We have been using silver collagen. She has been extensively revascularized on the right by Dr. Trula Slade and subsequently by Dr. Gwenlyn Found. 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 Baxter Flattery of infectious disease on 2/17. Dr. Gwenlyn Found on 2/23 and according to her her antibiotics stopped on 2/23. She is tolerating hyperbarics well. She has made a tremendous improvement in the right forefoot with only 2 smaller open wounds. Using silver collagen. On the left foot she has the area on the tip of the left third toe and the medial part of the left fourth toe. These had exposed bone last week I did not sense any of that today 2/12; sees Dr. Graylon Good on 2/17 and Dr. Gwenlyn Found on 2/23. We are going to use Dermagraft on this today however the lateral part of her train TMA incision on the right is healed and the medial part is down so much that we just continue with the silver collagen She has wounds on  the tip of her left third and the medial aspect of the left fourth. Both of these still have exposed bone I have not been able to get these to epithelialize. 2/19; she sees Dr. Gwenlyn Found on 2/23 and I have communicated with him about the left vascular supply. Looking at her angiogram from 08/03/2019 it looks as though that they could not cross the left CFA. Noted that she had one-vessel runoff bilaterally. She also apparently saw Dr. Graylon Good although I did not look up these results for preparation of this record. 2/26; Dr. Gwenlyn Found is going to do an angiogram next week on Wednesday. I think they are going to try to go at this both retrograde and anterograde to see if anything can be done to revascularize the left lower limb. She continues to make nice progress on the remaining wound on the right medial foot on  her TMA site and the toe it wounds on the left are responding nicely as well 3/12; the patient underwent an extensive and complicated revascularization on the left leg by Dr. Fletcher Anon on 3/3; she had an atherectomy on occluded left popliteal artery; anterior tibial artery followed by a balloon angioplasty. Atherectomy was also performed to the left SFA because there was still significant 50% stenosis in the left popliteal artery they performed an intravascular lithotripsy which improved the residual stenosis to 20%. The same lithotripsy was used to dilate the proximal left SFA. She had a drug-eluting stent placed I believe in the SFA. The patient returned for hyperbarics this week. She has had some eye problems on the right which she tells me are secondary to diabetic retinopathy and she saw her eye doctor. She is really made excellent progress there is no open wounds on the left foot at all. She has one open area medially and her TMA site on the right lateral wound is closed. 3/19; the patient comes in today with the area on the medial transmet on the right improved. Some debris removed from the surface revealed the still open wound. ooUnfortunately she now has the open area on the third toe tip and the medial aspect of the dorsal fourth toe. These are in the same location as her previous wounds. These had actually closed up last week. 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 She is undergone complex revascularizations on the right by Dr. Trula Slade and Dr. Gwenlyn Found and I believe Dr. 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 She completed IVantibiotics as directed by infectious disease and I believe a course of oral  antibiotics as well.she underwent a complete 40 treatments of hyperbarics. She comes in today with the vast majority of the right TMA site healed there is a small opening on the most medial aspect. Her left third and left fourth toes appear to have epithelialization although this has fluctuated somewhat. 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 She had a gaping wound on the right transmetatarsal amputation L as well as deep wounds on the left third and left fourth toes. She underwent complex revascularizations on her bilateral lower legs which I described on her last visit. She completed IV antibiotics by infectious disease and 40 treatments of hyperbarics. Her left third and fourth toes  are healed. The area on the right transmetatarsal amputation site is also closed today. READMISSION 08/04/2020 [name] is now a 80 year old woman that we had for a complex stay in our clinic from October 2000 14 January 2020. 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 Her current problems with wounds began on October 30 when she developed a spontaneous opening over the right transmet medial aspect over the first metatarsal head. She has been using collagen on this that she had leftover from her last stay in this clinic. She also has had an open area on the medial right calf from a vein harvest site from her CABG she. She said that this is never really closed since the vein harvest was done. With regards to her arterial status this is a major issue. She had an angiogram by Dr. Gwenlyn Found in September 2020. She developed a gangrenous right first and second toes. Ultimately she was referred to Dr. Trula Slade who did a right common femoral endarterectomy with patch angioplasty. This was on 07/21/2019 and she really had a good result. Dr. Gwenlyn Found did a atherectomy followed by a  drug coated balloon angioplasty of her right SFA popliteal and tibial peroneal trunk on 08/03/2019 again with excellent excellent results. Follow-up.Dopplers in November revealed revealed a widely patent SFA and tibioperoneal trunk. UNFORTUNATELY her recent angiogram that was done on 08/01/2020 showed an 80% right common femoral artery stenosis just above the previous endarterectomy site occluded right right SFA at the origin reconstituting the adductor canal by the profunda femoris collaterals. The profunda femoris is also diffusely diseased. There is and again another 80% tibioperoneal trunk stenosis and one-vessel runoff via the peroneal artery. She is seen Dr. Donzetta Matters and she is scheduled for a femoral endarterectomy and right femoral-popliteal bypass grafting with endarterectomy of her tibial peroneal trunk. The scheduled surgery is on 11/23 The patient does not complain of pain at rest. She does have 5-minute walking claudication. The wounds on her right first met head remanent was spontaneous she does not think she hit this. As mentioned in the area on her right medial calf is a vein harvest site Dr. Gwenlyn Found had texted me about getting her in the clinic and we have arranged this as soon as we can. I have told her that we will have to see how successful Dr. Donzetta Matters is before we know what can be done about the wounds in a precise fashion. Fortunately there does not seem to be any obvious infection involving the right foot although that may need to be looked at if things really deteriorate. She was treated with IV antibiotics and hyperbaric oxygen for her previous osteomyelitis 12/2; the patient underwent a right femoral endarterectomy and bypass femoral to popliteal artery. This was done on 08/16/2020. She is done remarkably well. She has a small area on the right anterior tibia and a small area in the medial part of her original right transmetatarsal amputation. She appears to have had an excellent  response to surgery. 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 Selner is a now 80 year old woman who arrives for review of wounds on the left TMA site extending into her plantar foot as well as an area on the left  posterior calcaneus. 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 She was extensively revascularized on the right at that time as noted above in our previous notes ended up receiving IV antibiotics and hyperbaric oxygen therapy. Miraculously this TMA site actually closed and she really has had not any trouble with this since. UNFORTUNATELY the same cannot be said of her left side. Her problem apparently started sometime in July when she hit her left third toe on a barstool while playing pool. She developed an acute infection which eventually led to underlying osteomyelitis. She underwent an extensive hospitalization from 04/27/2021 through 05/15/2021. She received IV antibiotics. Also extensive intervention by Dr. Stanford Breed of vein and vascular which included a left common femoral to tibioperoneal trunk bypass as well as a left third toe amputation on 05/03/2021. Unfortunately the area did not heal she ended up requiring a left TMA. By her description this was left open. She has been using a wound VAC on this to earlier this week and then was switched to Santyl. I think because of the left forefoot off loader she is also developed an area on the left posterior calcaneus. She is not complaining of a lot of pain. She has her daughter at home to change the dressings. She has been prescribed Santyl and she has a tube that she is brought into our clinic. I cannot get a lot of history of claudication although she is really not that mobile at this point walking with a walker. 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 She has had multiple interventions on the right side by vein and vascular and also by Dr. Gwenlyn Found as well as a transmetatarsal amputation on the right. She has had follow-up noninvasive studies on 06/06/2021; on the left that showed an ABI of 0.47 with monophasic waveforms a dorsalis pedis ABI of 0.40 with monophasic waveforms. Her ANGIOGRAM which was initially done by Dr. Oneida Alar on 04/28/2021 showed severe left foot superficial femoral artery occlusive disease and an occlusion of the left popliteal artery with two-vessel runoff to the left foot with moderate to severe disease below the knee popliteal artery and tibial disease. She had 80% distal right external iliac artery above the existing femoral above-knee popliteal bypass but she does not have an open wound on the right foot She arrives in clinic with an extensive open area on the TMA site which in the mid aspect extends into the plantar foot. She also has a more superficial area on the upper part of her Achilles area of the left heel 9/29; patient arrives in clinic today with slough over the TMA site. We use MolecuLight to look at the surface of this suggesting cyan and white discoloration over a large part of this wound also extending into the third metatarsal area. She also has the area on the left posterior calcaneus. This is slough covered we will switch to [facility] here as well. There is some warmth in the area and some erythema we will give her antibiotics as well 10/6; completely necrotic surface once again on the left TMA site. We have been using Santyl and Hydrofera Blue. She has an area on the left posterior Achilles and actually states this has been more painful this week indeed there is some erythema around this area. She  also describes I think claudication at  rest which is relieved by dipping her left leg over the side of the bed at night. 10/13; our intake nurse was able to brush some slough off the surface of the left TMA hence this did not require debridement that is better than last week. The area heading towards the plantar part of her foot required an aggressive debridement to clean this up. This is quite a sizable divot but does not go to bone. The area on the right Achilles heel is also covered in a high 100% nonviable tissue we have been using Santyl and silver alginate 10/20; the area on the left TMA looks much better. Even the area is spreading into the midfoot looks improved. The right Achilles still is covered and nonviable surface 100%. We have been using Santyl and silver alginate and this will continue for this week The patient is approved for 5 Apligraf's and I think she is good enough to start using these next week 10/27; the patient's wounds actually look quite good. We have been using Santyl and silver alginate. We applied Apligraf #1 today Saw Dr. Stanford Breed her vascular surgeon on 10/25 he told her TMA was healing nicely. She will follow-up with them in December to repeat noninvasive testing. Objective Constitutional Sitting or standing Blood Pressure is within target range for patient.. Pulse regular and within target range for patient.Marland Kitchen Respirations regular, non-labored and within target range.. Temperature is normal and within the target range for the patient.Marland Kitchen Appears in no distress. Vitals Time Taken: 9:27 AM, Height: 64 in, Source: Stated, Weight: 135 lbs, Source: Stated, BMI: 23.2, Temperature: 98.0 F, Pulse: 81 bpm, Respiratory Rate: 18 breaths/min, Blood Pressure: 132/78 mmHg, Capillary Blood Glucose: 116 mg/dl. General Notes: glucose per pt report this am General Notes: Wound exam; substantial wound of the left TMA extending into the midfoot plantar aspect. Surfaces of these wounds looks good. Vigorously debrided with  Anasept and gauze Apligraf #1 applied Integumentary (Hair, Skin) Wound #13 status is Open. Original cause of wound was Surgical Injury. The date acquired was: 05/11/2021. The wound has been in treatment 5 weeks. The wound is located on the Left Amputation Site - Transmetatarsal. The wound measures 4.7cm length x 6.5cm width x 0.5cm depth; 23.994cm^2 area and 11.997cm^3 volume. There is Fat Layer (Subcutaneous Tissue) exposed. There is no tunneling or undermining noted. There is a medium amount of serosanguineous drainage noted. The wound margin is distinct with the outline attached to the wound base. There is large (67-100%) red granulation within the wound bed. There is a small (1-33%) amount of necrotic tissue within the wound bed including Adherent Slough. Wound #14 status is Open. Original cause of wound was Pressure Injury. The date acquired was: 05/11/2021. The wound has been in treatment 5 weeks. The wound is located on the Left,Posterior Calcaneus. The wound measures 0.5cm length x 1.4cm width x 0.1cm depth; 0.55cm^2 area and 0.055cm^3 volume. There is Fat Layer (Subcutaneous Tissue) exposed. There is no tunneling or undermining noted. There is a medium amount of serous drainage noted. The wound margin is flat and intact. There is small (1-33%) pink granulation within the wound bed. There is a large (67-100%) amount of necrotic tissue within the wound bed including Adherent Slough. 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 A skin graft procedure using a bioengineered skin substitute/cellular or tissue based product was performed by Ricard Dillon., MD with the following instrument(s): Forceps and Scissors. Apligraf was applied and secured with Steri-Strips. 40 sq cm of product was utilized and 0 sq cm was wasted. Post Application, adaptic was applied. A Time Out was conducted at 10:05, prior to the start of the procedure. The procedure was tolerated well with a pain level of 0 throughout and a pain level of 0 following the procedure. Post procedure Diagnosis Wound #13: Same as Pre-Procedure . Wound #14 Pre-procedure diagnosis of Wound #14 is a Diabetic Wound/Ulcer of the Lower Extremity located on the Left,Posterior Calcaneus. A skin graft procedure using a bioengineered skin substitute/cellular or tissue based product was performed by Ricard Dillon., MD with the following instrument(s): Forceps and Scissors. Apligraf was applied and secured with Steri-Strips. 4 sq cm of product was utilized and 0 sq cm was wasted. Post Application, adaptic was applied. A Time Out was conducted at 10:05, prior to the start of the procedure. The procedure was tolerated well with a pain level of 0 throughout and a pain level of 0 following the procedure. Post procedure Diagnosis Wound #14: Same as Pre-Procedure . Plan Follow-up Appointments: Return Appointment in 1 week. - with Dr. Dellia Nims Cellular or Tissue Based Products: Cellular or Tissue Based Product Type: - Apligraf #1 applied 07/20/21 Cellular or Tissue Based Product applied to wound bed, secured with steri-strips, cover with Adaptic or Mepitel. (DO NOT REMOVE). - May change secondary dressing only as needed for drainage. DO NOT REMOVE steristrips or clean wound bed Bathing/ Shower/ Hygiene: May shower with protection but do not get wound dressing(s) wet. Off-Loading: Open toe surgical shoe to: -  right foot Wedge shoe to: - left foot Home Health: No change in wound care orders this week; continue Home Health for wound care. May utilize formulary equivalent dressing for wound treatment orders unless otherwise specified. New wound care orders this week; continue Home Health for wound care. May utilize formulary equivalent dressing for wound treatment orders unless otherwise specified. - apligraf applied. May change secondary dressing only as needed for drainage Other Home Health Orders/Instructions: - [facility] 3x a week WOUND #13: - Amputation Site - Transmetatarsal Wound Laterality: Left Cleanser: Soap and Water 1 x Per Week/7 Days Discharge Instructions: May shower and wash wound with dial antibacterial soap and water prior to dressing change. Cleanser: Wound Cleanser (Home Health) 1 x Per Week/7 Days Discharge Instructions: Cleanse the wound with wound cleanser or normal saline prior to applying a clean dressing using gauze sponges, not tissue or cotton balls. Prim Dressing: Apligraf 1 x Per Week/7 Days ary Secondary Dressing: Woven Gauze Sponge, Non-Sterile 4x4 in (Home Health) 1 x Per Week/7 Days Discharge Instructions: Apply over primary dressing as directed. Secondary Dressing: ABD Pad, 5x9 (Home Health) 1 x Per Week/7 Days Discharge Instructions: Apply over primary dressing as directed. Secondary Dressing: ADAPTIC TOUCH 3x4.25 in 1 x Per Week/7 Days Discharge Instructions: Apply over primary dressing and secured with steristrips Secured With: Kerlix Roll Sterile, 4.5x3.1 (in/yd) (Home Health) 1 x Per Week/7 Days Discharge Instructions: Secure with Kerlix as directed. Secured With: Paper T ape, 2x10 (in/yd) ([place]) 1 x Per  Week/7 Days Discharge Instructions: Secure dressing with tape as directed. WOUND #14: - Calcaneus Wound Laterality: Left, Posterior Cleanser: Soap and Water 1 x Per Week/7 Days Discharge Instructions: May shower and wash wound with dial antibacterial  soap and water prior to dressing change. Cleanser: Wound Cleanser (Home Health) 1 x Per Week/7 Days Discharge Instructions: Cleanse the wound with wound cleanser or normal saline prior to applying a clean dressing using gauze sponges, not tissue or cotton balls. Prim Dressing: Apligraf 1 x Per Week/7 Days ary Secondary Dressing: Woven Gauze Sponge, Non-Sterile 4x4 in (Home Health) 1 x Per Week/7 Days Discharge Instructions: Apply over primary dressing as directed. Secondary Dressing: ALLEVYN Heel 4 1/2in x 5 1/2in / 10.5cm x 13.5cm (Home Health) 1 x Per Week/7 Days Discharge Instructions: Apply over primary dressing as directed. Secondary Dressing: ADAPTIC TOUCH 3x4.25 in 1 x Per Week/7 Days Discharge Instructions: Apply over primary dressing and secured with steristrips Secured With: Kerlix Roll Sterile, 4.5x3.1 (in/yd) (Home Health) 1 x Per Week/7 Days Discharge Instructions: Secure with Kerlix as directed. Secured With: Paper T ape, 2x10 (in/yd) (Home Health) 1 x Per Week/7 Days Discharge Instructions: Secure dressing with tape as directed. 1. Apligraf #1 applied in the standard fashion 2. We will see her back in 2 weeks. Home health will change the exterior dressings next week Electronic Signature(s) Signed: 07/25/2021 4:29:54 PM By: [name] MD Entered By: [name] on 07/20/2021 10:34:05 -------------------------------------------------------------------------------- SuperBill Details Patient Name: Date of Service: [name], [name] 07/20/2021 Medical Record Number: [id number] Patient Account Number: [id number] Date of Birth/Sex: Treating RN: 30-Dec-1940 (80 y.o. 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 Facility Procedures CPT4 Code: 11941740 Description: (Facility Use Only) Apligraf 1 SQ CM Modifier: Quantity: [place] Code: [id number] Description: 63149 - SKIN SUB GRAFT FACE/NK/HF/G ICD-10 Diagnosis Description L97.428 Non-pressure chronic ulcer of left heel and midfoot with other specified severit Modifier: y Quantity: 1 CPT4 Code: [id number] Description: 88502 - SKIN SUB GRAFT F/N/HF/G ADDL ICD-10 Diagnosis Description L97.528 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 Modifier: ity Quantity: 1 Physician Procedures : CPT4 Code Description Modifier [id number] - WC PHYS SKIN SUB GRAFT FACE/NK/HF/G ICD-10 Diagnosis Description L97.428 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

## 2021-07-25 NOTE — Progress Notes (Signed)
FAREN, FLORENCE ([id number]) Visit Report for 07/20/2021 Arrival Information Details Patient Name: Date of Service: [name] 07/20/2021 9:00 A M Medical Record Number: [id number] Patient Account Number: [id number] Date of Birth/Sex: Treating RN: Jun 23, 1941 (80 y.o. [name], [name] Primary Care Kalen Neidert: [name] Other Clinician: Referring Quinnley Colasurdo: Treating Kaye Mitro/[name]: Tressie Stalker in Treatment: 5 Visit Information History Since Last Visit All ordered tests and consults were completed: Yes Patient Arrived: [name] Added or deleted any medications: No Arrival Time: 09:24 Any new allergies or adverse reactions: No Accompanied By: self Had a fall or experienced change in No Transfer Assistance: None activities of daily living that may affect Patient Identification Verified: Yes risk of falls: Secondary Verification Process Completed: Yes Signs or symptoms of abuse/neglect since last visito No Patient Requires Transmission-Based Precautions: No Hospitalized since last visit: No Patient Has Alerts: Yes Implantable device outside of the clinic excluding No Patient Alerts: Patient on Blood Thinner cellular tissue based products placed in the center L ABI: 0.82 since last visit: R ABI: 0.47 Has Dressing in Place as Prescribed: Yes Pain Present Now: Yes Electronic Signature(s) Signed: 07/20/2021 5:29:08 PM By: Baruch Gouty RN, BSN Entered By: Baruch Gouty on 07/20/2021 09:27:28 -------------------------------------------------------------------------------- Encounter Discharge Information Details Patient Name: Date of Service: [name], [name]. 07/20/2021 9:00 A M Medical Record Number: [id number] Patient Account Number: [id number] Date of Birth/Sex: Treating RN: 26-May-1941 (80 y.o. [name] Primary Care Brogan England: [name] Other Clinician: Referring Sharnee Douglass: Treating [name] [name]/[name]: Tressie Stalker in Treatment: 5 Encounter Discharge Information Items Post Procedure Vitals Discharge Condition: Stable Temperature (F): 98 Ambulatory Status: Walker Pulse (bpm): 81 Discharge Destination: Home Respiratory Rate (breaths/min): 18 Transportation: Private Auto Blood Pressure (mmHg): 132/78 Accompanied By: self Schedule Follow-up Appointment: Yes Clinical Summary of Care: Patient Declined Electronic Signature(s) Signed: 07/20/2021 5:29:08 PM By: Baruch Gouty RN, BSN Entered By: Baruch Gouty on 07/20/2021 10:29:57 -------------------------------------------------------------------------------- Lower Extremity Assessment Details Patient Name: Date of Service: [name], [place]. 07/20/2021 9:00 A M Medical Record Number: [id number] Patient Account Number: [id number] Date of Birth/Sex: Treating RN: [birth year] (80 y.o. [name] Primary Care Joey Hudock: [name] Other Clinician: Referring Alexi Geibel: Treating Luka Stohr/[name]: Tressie Stalker in Treatment: 5 Edema Assessment Assessed: Shirlyn Goltz: No] [Right: No] Edema: [Left: N] [Right: o] Calf Left: Right: Point of Measurement: From Medial Instep 33.5 cm Ankle Left: Right: Point of Measurement: From Medial Instep 20 cm Vascular Assessment Pulses: Dorsalis Pedis Palpable: [Left:Yes] Electronic Signature(s) Signed: 07/20/2021 5:29:08 PM By: Baruch Gouty RN, BSN Entered By: Baruch Gouty on 07/20/2021 09:35:33 -------------------------------------------------------------------------------- Multi Wound Chart Details Patient Name: Date of Service: [name], [place]. 07/20/2021 9:00 A M Medical Record Number: [id number] Patient Account Number: [id number] Date of Birth/Sex: Treating RN: 1941-08-31 (80 y.o. [name] Primary Care Omah Dewalt: [name] Other Clinician: Referring Yaretsi Humphres: Treating [place] Pettengill/[name]: Tressie Stalker in Treatment: 5 Vital Signs Height(in): 64 Capillary Blood Glucose(mg/dl): 116 Weight(lbs): 135 Pulse(bpm): 38 Body Mass Index(BMI): 23 Blood Pressure(mmHg): 132/78 Temperature(F): 98.0 Respiratory Rate(breaths/min): 18 Photos: [N/A:N/A] Left Amputation Site - Transmetatarsal Left, Posterior Calcaneus N/A Wound Location: Surgical Injury Pressure Injury N/A Wounding Event: Diabetic Wound/Ulcer of the Lower Diabetic Wound/Ulcer of the Lower N/A Primary Etiology: Extremity Extremity Open Surgical Wound N/A N/A Secondary Etiology: Coronary Artery Disease, Deep Vein Coronary Artery Disease, Deep Vein N/A Comorbid History: Thrombosis, Hypertension, Myocardial Thrombosis, Hypertension, Myocardial Infarction, Peripheral Arterial Disease, Infarction, Peripheral Arterial  Disease, Type II Diabetes Type II Diabetes 05/11/2021 05/11/2021 N/A Date Acquired: 5 5 N/A Weeks of Treatment: Open Open N/A Wound Status: 4.7x6.5x0.5 0.5x1.4x0.1 N/A Measurements L x W x D (cm) 23.994 0.55 N/A A (cm) : rea 11.997 0.055 N/A Volume (cm) : 37.30% 60.20% N/A % Reduction in A rea: 65.20% 60.10% N/A % Reduction in Volume: Grade 2 Grade 1 N/A Classification: Medium Medium N/A Exudate A mount: Serosanguineous Serous N/A Exudate Type: red, brown amber N/A Exudate Color: Distinct, outline attached Flat and Intact N/A Wound Margin: Large (67-100%) Small (1-33%) N/A Granulation A mount: Red Pink N/A Granulation Quality: Small (1-33%) Large (67-100%) N/A Necrotic A mount: Fat Layer (Subcutaneous Tissue): Yes Fat Layer (Subcutaneous Tissue): Yes N/A Exposed Structures: Fascia: No Fascia: No Tendon: No Tendon: No Muscle: No Muscle: No Joint: No Joint: No Bone: No Bone: No Small (1-33%) None N/A Epithelialization: Cellular or Tissue Based Product Cellular or Tissue Based Product N/A Procedures Performed: Treatment Notes Wound #13 (Amputation  Site - Transmetatarsal) Wound Laterality: Left Cleanser Soap and Water Discharge Instruction: May shower and wash wound with dial antibacterial soap and water prior to dressing change. Wound Cleanser Discharge Instruction: Cleanse the wound with wound cleanser or normal saline prior to applying a clean dressing using gauze sponges, not tissue or cotton balls. Peri-Wound Care Topical Primary Dressing Apligraf Secondary Dressing Woven Gauze Sponge, Non-Sterile 4x4 in Discharge Instruction: Apply over primary dressing as directed. ABD Pad, 5x9 Discharge Instruction: Apply over primary dressing as directed. ADAPTIC TOUCH 3x4.25 in Discharge Instruction: Apply over primary dressing and secured with steristrips Secured With Kerlix Roll Sterile, 4.5x3.1 (in/yd) Discharge Instruction: Secure with Kerlix as directed. Paper Tape, 2x10 (in/yd) Discharge Instruction: Secure dressing with tape as directed. Compression Wrap Compression Stockings Add-Ons Wound #14 (Calcaneus) Wound Laterality: Left, Posterior Cleanser Soap and Water Discharge Instruction: May shower and wash wound with dial antibacterial soap and water prior to dressing change. Wound Cleanser Discharge Instruction: Cleanse the wound with wound cleanser or normal saline prior to applying a clean dressing using gauze sponges, not tissue or cotton balls. Peri-Wound Care Topical Primary Dressing Apligraf Secondary Dressing Woven Gauze Sponge, Non-Sterile 4x4 in Discharge Instruction: Apply over primary dressing as directed. ALLEVYN Heel 4 1/2in x 5 1/2in / 10.5cm x 13.5cm Discharge Instruction: Apply over primary dressing as directed. ADAPTIC TOUCH 3x4.25 in Discharge Instruction: Apply over primary dressing and secured with steristrips Secured With Kerlix Roll Sterile, 4.5x3.1 (in/yd) Discharge Instruction: Secure with Kerlix as directed. Paper Tape, 2x10 (in/yd) Discharge Instruction: Secure dressing with tape as  directed. Compression Wrap Compression Stockings Add-Ons Electronic Signature(s) Signed: 07/20/2021 5:29:08 PM By: Baruch Gouty RN, BSN Signed: 07/25/2021 4:29:54 PM By: [name] MD Entered By: [name] on 07/20/2021 10:30:43 -------------------------------------------------------------------------------- Multi-Disciplinary Care Plan Details Patient Name: Date of Service: [name], [name] Mourning. 07/20/2021 9:00 A M Medical Record Number: [id number] Patient Account Number: [id number] Date of Birth/Sex: Treating RN: [birth year] (80 y.o. [name] Primary Care Shakara Tweedy: [name] Other Clinician: Referring Genever Hentges: Treating Kyce Ging/[name]: Tressie Stalker in Treatment: 5 Multidisciplinary Care Plan reviewed with physician Active Inactive Nutrition Nursing Diagnoses: Impaired glucose control: actual or potential Potential for alteratiion in Nutrition/Potential for imbalanced nutrition Goals: Patient/caregiver agrees to and verbalizes understanding of need to use nutritional supplements and/or vitamins as prescribed Date Initiated: 06/15/2021 Target Resolution Date: 08/18/2021 Goal Status: Active Patient/caregiver will maintain therapeutic glucose control Date Initiated: 06/15/2021 Target Resolution Date: 08/10/2021 Goal Status: Active Interventions: Assess HgA1c results  as ordered upon admission and as needed Assess patient nutrition upon admission and as needed per policy Provide education on elevated blood sugars and impact on wound healing Provide education on nutrition Treatment Activities: Education provided on Nutrition : 06/15/2021 Notes: Wound/Skin Impairment Nursing Diagnoses: Impaired tissue integrity Knowledge deficit related to ulceration/compromised skin integrity Goals: Patient/caregiver will verbalize understanding of skin care regimen Date Initiated: 06/15/2021 Target Resolution Date: 08/10/2021 Goal  Status: Active Interventions: Assess patient/caregiver ability to obtain necessary supplies Assess patient/caregiver ability to perform ulcer/skin care regimen upon admission and as needed Assess ulceration(s) every visit Provide education on ulcer and skin care Notes: Electronic Signature(s) Signed: 07/20/2021 5:29:08 PM By: Baruch Gouty RN, BSN Entered By: Baruch Gouty on 07/20/2021 09:41:40 -------------------------------------------------------------------------------- Pain Assessment Details Patient Name: Date of Service: [name], [place]. 07/20/2021 9:00 A M Medical Record Number: [id number] Patient Account Number: [id number] Date of Birth/Sex: Treating RN: Dec 21, 1940 (80 y.o. [name] Primary Care Lynore Coscia: [name] Other Clinician: Referring Nikeshia Keetch: Treating Neetu Carrozza/[name]: Tressie Stalker in Treatment: 5 Active Problems Location of Pain Severity and Description of Pain Patient Has Paino Yes Site Locations Pain Location: Pain in Ulcers With Dressing Change: No Duration of the Pain. Constant / Intermittento Intermittent Rate the pain. Current Pain Level: 0 Worst Pain Level: 4 Least Pain Level: 0 Character of Pain Describe the Pain: Sharp Pain Management and Medication Current Pain Management: Other: time Is the Current Pain Management Adequate: Adequate How does your wound impact your activities of daily livingo Sleep: Yes Bathing: No Appetite: No Relationship With Others: No Bladder Continence: No Emotions: No Bowel Continence: No Work: No Toileting: No Drive: No Dressing: No Hobbies: No Electronic Signature(s) Signed: 07/20/2021 5:29:08 PM By: Baruch Gouty RN, BSN Entered By: Baruch Gouty on 07/20/2021 09:30:15 -------------------------------------------------------------------------------- Patient/Caregiver Education Details Patient Name: Date of Service: [name], [name] Mourning  10/27/2022andnbsp9:00 A M Medical Record Number: [id number] Patient Account Number: [id number] Date of Birth/Gender: Treating RN: 1940-09-26 (80 y.o. [name] Primary Care Physician: [name] Other Clinician: Referring Physician: Treating Physician/[name]: Tressie Stalker in Treatment: 5 Education Assessment Education Provided To: Patient Education Topics Provided Elevated Blood Sugar/ Impact on Healing: Methods: Explain/Verbal Responses: Reinforcements needed, State content correctly Offloading: Methods: Explain/Verbal Responses: Reinforcements needed, State content correctly Wound/Skin Impairment: Methods: Explain/Verbal Responses: Reinforcements needed, State content correctly Electronic Signature(s) Signed: 07/20/2021 5:29:08 PM By: Baruch Gouty RN, BSN Entered By: Baruch Gouty on 07/20/2021 09:44:01 -------------------------------------------------------------------------------- Wound Assessment Details Patient Name: Date of Service: [name], [place]. 07/20/2021 9:00 A M Medical Record Number: [id number] Patient Account Number: [id number] Date of Birth/Sex: Treating RN: [birth year] (80 y.o. [name] Primary Care Ezekial Arns: [name] Other Clinician: Referring Navjot Pilgrim: Treating Japneet Staggs/[name]: Tressie Stalker in Treatment: 5 Wound Status Wound Number: 13 Primary Diabetic Wound/Ulcer of the Lower Extremity Etiology: Etiology: Wound Location: Left Amputation Site - Transmetatarsal Secondary Open Surgical Wound Wounding Event: Surgical Injury Etiology: Date Acquired: 05/11/2021 Wound Open Weeks Of Treatment: 5 Status: Clustered Wound: No Comorbid Coronary Artery Disease, Deep Vein Thrombosis, Hypertension, History: Myocardial Infarction, Peripheral Arterial Disease, Type II Diabetes Photos Wound Measurements Length: (cm) 4.7 Width: (cm) 6.5 Depth: (cm)  0.5 Area: (cm) 23.994 Volume: (cm) 11.997 % Reduction in Area: 37.3% % Reduction in Volume: 65.2% Epithelialization: Small (1-33%) Tunneling: No Undermining: No Wound Description Classification: Grade 2 Wound Margin: Distinct, outline attached Exudate Amount: Medium Exudate Type: Serosanguineous Exudate Color: red, brown Foul Odor After  Cleansing: No Slough/Fibrino Yes Wound Bed Granulation Amount: Large (67-100%) Exposed Structure Granulation Quality: Red Fascia Exposed: No Necrotic Amount: Small (1-33%) Fat Layer (Subcutaneous Tissue) Exposed: Yes Necrotic Quality: Adherent Slough Tendon Exposed: No Muscle Exposed: No Joint Exposed: No Bone Exposed: No Treatment Notes Wound #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 Wound Cleanser Discharge Instruction: Cleanse the wound with wound cleanser or normal saline prior to applying a clean dressing using gauze sponges, not tissue or cotton balls. Peri-Wound Care Topical Primary Dressing Apligraf Secondary Dressing Woven Gauze Sponge, Non-Sterile 4x4 in Discharge Instruction: Apply over primary dressing as directed. ABD Pad, 5x9 Discharge Instruction: Apply over primary dressing as directed. ADAPTIC TOUCH 3x4.25 in Discharge Instruction: Apply over primary dressing and secured with steristrips Secured With Kerlix Roll Sterile, 4.5x3.1 (in/yd) Discharge Instruction: Secure with Kerlix as directed. Paper Tape, 2x10 (in/yd) Discharge Instruction: Secure dressing with tape as directed. Compression Wrap Compression Stockings Add-Ons Electronic Signature(s) Signed: 07/20/2021 5:29:08 PM By: Baruch Gouty RN, BSN Signed: 07/20/2021 5:43:20 PM By: Deon Pilling RN, BSN Entered By: Deon Pilling on 07/20/2021  09:40:33 -------------------------------------------------------------------------------- Wound Assessment Details Patient Name: Date of Service: [name], [name] 07/20/2021 9:00 A M Medical Record Number: [id number] Patient Account Number: [id number] Date of Birth/Sex: Treating RN: 12-14-1940 (80 y.o. [name] Primary Care Farrah Skoda: [name] Other Clinician: Referring Natascha Edmonds: Treating Jonique Kulig/[name]: Tressie Stalker in Treatment: 5 Wound Status Wound Number: 14 Primary Diabetic Wound/Ulcer of the Lower Extremity Etiology: Wound Location: Left, Posterior Calcaneus Wound Open Wounding Event: Pressure Injury Status: Date Acquired: 05/11/2021 Comorbid Coronary Artery Disease, Deep Vein Thrombosis, Hypertension, Weeks Of Treatment: 5 History: Myocardial Infarction, Peripheral Arterial Disease, Type II Diabetes Clustered Wound: No Photos Wound Measurements Length: (cm) 0.5 Width: (cm) 1.4 Depth: (cm) 0.1 Area: (cm) 0.55 Volume: (cm) 0.055 % Reduction in Area: 60.2% % Reduction in Volume: 60.1% Epithelialization: None Tunneling: No Undermining: No Wound Description Classification: Grade 1 Wound Margin: Flat and Intact Exudate Amount: Medium Exudate Type: Serous Exudate Color: amber Foul Odor After Cleansing: No Slough/Fibrino Yes Wound Bed Granulation Amount: Small (1-33%) Exposed Structure Granulation Quality: Pink Fascia Exposed: No Necrotic Amount: Large (67-100%) Fat Layer (Subcutaneous Tissue) Exposed: Yes Necrotic Quality: Adherent Slough Tendon Exposed: No Muscle Exposed: No Joint Exposed: No Bone Exposed: No Treatment Notes Wound #14 (Calcaneus) Wound Laterality: Left, Posterior Cleanser Soap and Water Discharge Instruction: May shower and wash wound with dial antibacterial soap and water prior to dressing change. Wound Cleanser Discharge Instruction: Cleanse the wound with wound cleanser or normal  saline prior to applying a clean dressing using gauze sponges, not tissue or cotton balls. Peri-Wound Care Topical Primary Dressing Apligraf Secondary Dressing Woven Gauze Sponge, Non-Sterile 4x4 in Discharge Instruction: Apply over primary dressing as directed. ALLEVYN Heel 4 1/2in x 5 1/2in / 10.5cm x 13.5cm Discharge Instruction: Apply over primary dressing as directed. ADAPTIC TOUCH 3x4.25 in Discharge Instruction: Apply over primary dressing and secured with steristrips Secured With Kerlix Roll Sterile, 4.5x3.1 (in/yd) Discharge Instruction: Secure with Kerlix as directed. Paper Tape, 2x10 (in/yd) Discharge Instruction: Secure dressing with tape as directed. Compression Wrap Compression Stockings Add-Ons Electronic Signature(s) Signed: 07/20/2021 5:29:08 PM By: Baruch Gouty RN, BSN Signed: 07/20/2021 5:43:20 PM By: Deon Pilling RN, BSN Entered By: Deon Pilling on 07/20/2021 09:41:21 -------------------------------------------------------------------------------- Vitals Details Patient Name: Date of Service: [name], [place]  H. 07/20/2021 9:00 A M Medical Record Number: [id number] Patient Account Number: [id number] Date of Birth/Sex: Treating RN: [birth year] (80 y.o. [name] Primary Care Arieonna Medine: [name] Other Clinician: Referring Tyshawn Ciullo: Treating Josephina Melcher/[name]: Tressie Stalker in Treatment: 5 Vital Signs Time Taken: 09:27 Temperature (F): 98.0 Height (in): 64 Pulse (bpm): 81 Source: Stated Respiratory Rate (breaths/min): 18 Weight (lbs): 135 Blood Pressure (mmHg): 132/78 Source: Stated Capillary Blood Glucose (mg/dl): 116 Body Mass Index (BMI): 23.2 Reference Range: 80 - 120 mg / dl Notes glucose per pt report this am Electronic Signature(s) Signed: 07/20/2021 5:29:08 PM By: Baruch Gouty RN, BSN Signed: 07/20/2021 5:29:08 PM By: Baruch Gouty RN, BSN Entered By: Baruch Gouty on 07/20/2021  09:28:35

## 2021-07-26 DIAGNOSIS — D638 Anemia in other chronic diseases classified elsewhere: Secondary | ICD-10-CM | POA: Diagnosis not present

## 2021-07-26 DIAGNOSIS — Z4801 Encounter for change or removal of surgical wound dressing: Secondary | ICD-10-CM | POA: Diagnosis not present

## 2021-07-26 DIAGNOSIS — I70202 Unspecified atherosclerosis of native arteries of extremities, left leg: Secondary | ICD-10-CM | POA: Diagnosis not present

## 2021-07-26 DIAGNOSIS — M86172 Other acute osteomyelitis, left ankle and foot: Secondary | ICD-10-CM | POA: Diagnosis not present

## 2021-07-26 DIAGNOSIS — Z4781 Encounter for orthopedic aftercare following surgical amputation: Secondary | ICD-10-CM | POA: Diagnosis not present

## 2021-07-26 DIAGNOSIS — L89620 Pressure ulcer of left heel, unstageable: Secondary | ICD-10-CM | POA: Diagnosis not present

## 2021-07-31 DIAGNOSIS — D638 Anemia in other chronic diseases classified elsewhere: Secondary | ICD-10-CM | POA: Diagnosis not present

## 2021-07-31 DIAGNOSIS — I739 Peripheral vascular disease, unspecified: Secondary | ICD-10-CM | POA: Diagnosis not present

## 2021-07-31 DIAGNOSIS — E113393 Type 2 diabetes mellitus with moderate nonproliferative diabetic retinopathy without macular edema, bilateral: Secondary | ICD-10-CM | POA: Diagnosis not present

## 2021-07-31 DIAGNOSIS — M86172 Other acute osteomyelitis, left ankle and foot: Secondary | ICD-10-CM | POA: Diagnosis not present

## 2021-07-31 DIAGNOSIS — D649 Anemia, unspecified: Secondary | ICD-10-CM | POA: Diagnosis not present

## 2021-07-31 DIAGNOSIS — Z0000 Encounter for general adult medical examination without abnormal findings: Secondary | ICD-10-CM | POA: Diagnosis not present

## 2021-07-31 DIAGNOSIS — E11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: Secondary | ICD-10-CM | POA: Diagnosis not present

## 2021-07-31 DIAGNOSIS — Z23 Encounter for immunization: Secondary | ICD-10-CM | POA: Diagnosis not present

## 2021-07-31 DIAGNOSIS — Z4781 Encounter for orthopedic aftercare following surgical amputation: Secondary | ICD-10-CM | POA: Diagnosis not present

## 2021-07-31 DIAGNOSIS — Z4801 Encounter for change or removal of surgical wound dressing: Secondary | ICD-10-CM | POA: Diagnosis not present

## 2021-07-31 DIAGNOSIS — L89620 Pressure ulcer of left heel, unstageable: Secondary | ICD-10-CM | POA: Diagnosis not present

## 2021-07-31 DIAGNOSIS — E7800 Pure hypercholesterolemia, unspecified: Secondary | ICD-10-CM | POA: Diagnosis not present

## 2021-07-31 DIAGNOSIS — M8580 Other specified disorders of bone density and structure, unspecified site: Secondary | ICD-10-CM | POA: Diagnosis not present

## 2021-07-31 DIAGNOSIS — I70723 Atherosclerosis of other type of bypass graft(s) of the extremities with rest pain, bilateral legs: Secondary | ICD-10-CM | POA: Diagnosis not present

## 2021-07-31 DIAGNOSIS — I10 Essential (primary) hypertension: Secondary | ICD-10-CM | POA: Diagnosis not present

## 2021-07-31 DIAGNOSIS — Z89439 Acquired absence of unspecified foot: Secondary | ICD-10-CM | POA: Diagnosis not present

## 2021-07-31 DIAGNOSIS — I70202 Unspecified atherosclerosis of native arteries of extremities, left leg: Secondary | ICD-10-CM | POA: Diagnosis not present

## 2021-08-02 DIAGNOSIS — I70202 Unspecified atherosclerosis of native arteries of extremities, left leg: Secondary | ICD-10-CM | POA: Diagnosis not present

## 2021-08-02 DIAGNOSIS — Z4781 Encounter for orthopedic aftercare following surgical amputation: Secondary | ICD-10-CM | POA: Diagnosis not present

## 2021-08-02 DIAGNOSIS — M86172 Other acute osteomyelitis, left ankle and foot: Secondary | ICD-10-CM | POA: Diagnosis not present

## 2021-08-02 DIAGNOSIS — Z4801 Encounter for change or removal of surgical wound dressing: Secondary | ICD-10-CM | POA: Diagnosis not present

## 2021-08-02 DIAGNOSIS — L89620 Pressure ulcer of left heel, unstageable: Secondary | ICD-10-CM | POA: Diagnosis not present

## 2021-08-02 DIAGNOSIS — D638 Anemia in other chronic diseases classified elsewhere: Secondary | ICD-10-CM | POA: Diagnosis not present

## 2021-08-03 ENCOUNTER — Encounter (HOSPITAL_BASED_OUTPATIENT_CLINIC_OR_DEPARTMENT_OTHER): Payer: Medicare Other | Attending: Internal Medicine | Admitting: Internal Medicine

## 2021-08-03 ENCOUNTER — Other Ambulatory Visit: Payer: Self-pay

## 2021-08-03 DIAGNOSIS — E11621 Type 2 diabetes mellitus with foot ulcer: Secondary | ICD-10-CM | POA: Diagnosis not present

## 2021-08-03 DIAGNOSIS — E11319 Type 2 diabetes mellitus with unspecified diabetic retinopathy without macular edema: Secondary | ICD-10-CM | POA: Diagnosis not present

## 2021-08-03 DIAGNOSIS — L97422 Non-pressure chronic ulcer of left heel and midfoot with fat layer exposed: Secondary | ICD-10-CM | POA: Diagnosis not present

## 2021-08-03 DIAGNOSIS — I2510 Atherosclerotic heart disease of native coronary artery without angina pectoris: Secondary | ICD-10-CM | POA: Insufficient documentation

## 2021-08-03 DIAGNOSIS — E1142 Type 2 diabetes mellitus with diabetic polyneuropathy: Secondary | ICD-10-CM | POA: Diagnosis not present

## 2021-08-03 DIAGNOSIS — L97428 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: Secondary | ICD-10-CM | POA: Insufficient documentation

## 2021-08-03 DIAGNOSIS — L97522 Non-pressure chronic ulcer of other part of left foot with fat layer exposed: Secondary | ICD-10-CM | POA: Diagnosis not present

## 2021-08-03 DIAGNOSIS — Z951 Presence of aortocoronary bypass graft: Secondary | ICD-10-CM | POA: Insufficient documentation

## 2021-08-03 DIAGNOSIS — E1152 Type 2 diabetes mellitus with diabetic peripheral angiopathy with gangrene: Secondary | ICD-10-CM | POA: Insufficient documentation

## 2021-08-03 DIAGNOSIS — M869 Osteomyelitis, unspecified: Secondary | ICD-10-CM | POA: Insufficient documentation

## 2021-08-03 NOTE — Progress Notes (Signed)
JULIET, VASBINDER ([id number]) Visit Report for 08/03/2021 Cellular or Tissue Based Product Details Patient Name: Date of Service: [name] 08/03/2021 8:30 A M Medical Record Number: [id number] Patient Account Number: [id number] Date of Birth/Sex: Treating RN: [birth year] (80 y.o. [name] Primary Care Provider: [name] Other Clinician: Referring Provider: Treating Provider/[name]: Tressie Stalker in Treatment: 7 Cellular or Tissue Based Product Type Wound #13 Left Amputation Site - Transmetatarsal Applied to: Performed By: Physician Ricard Dillon., MD Cellular or Tissue Based Product Type: Apligraf Level of Consciousness (Pre-procedure): Awake and Alert Pre-procedure Verification/Time Out Yes - 09:07 Taken: Location: genitalia / hands / feet / multiple digits Wound Size (sq cm): 23.4 Product Size (sq cm): 40 Waste Size (sq cm): 0 Amount of Product Applied (sq cm): 40 Instrument Used: Blade, Forceps, Scissors Lot #: [id number] Order #: 2 Expiration Date: 08/12/2021 Fenestrated: Yes Instrument: Blade Reconstituted: Yes Solution Type: saline Solution Amount: 5 ml Lot #: [id number] Solution Expiration Date: 05/25/2022 Secured: Yes Secured With: Steri-Strips Dressing Applied: Yes Primary Dressing: adaptic gauze Procedural Pain: 0 Post Procedural Pain: 0 Response to Treatment: Procedure was tolerated well Level of Consciousness (Post- Awake and Alert procedure): Post Procedure Diagnosis Same as Pre-procedure Electronic Signature(s) Signed: 08/03/2021 5:10:51 PM By: [name] MD Entered By: [name] on 08/03/2021 09:32:21 -------------------------------------------------------------------------------- Cellular or Tissue Based Product Details Patient Name: Date of Service: [name], [place]. 08/03/2021 8:30 A M Medical Record Number: [id number] Patient Account Number: [id number] Date of Birth/Sex:  Treating RN: 1941/01/01 (80 y.o. [name] Primary Care Provider: [name] Other Clinician: Referring Provider: Treating Provider/[name]: Tressie Stalker in Treatment: 7 Cellular or Tissue Based Product Type Wound #14 Left,Posterior Calcaneus Applied to: Performed By: Physician Ricard Dillon., MD Cellular or Tissue Based Product Type: Apligraf Level of Consciousness (Pre-procedure): Awake and Alert Pre-procedure Verification/Time Out Yes - 09:07 Taken: Location: genitalia / hands / feet / multiple digits Wound Size (sq cm): 0.96 Product Size (sq cm): 4 Waste Size (sq cm): 0 Amount of Product Applied (sq cm): 4 Instrument Used: Blade, Forceps, Scissors Lot #: [id number] Order #: 2 Expiration Date: 08/12/2021 Fenestrated: Yes Instrument: Blade Reconstituted: Yes Solution Type: saline Solution Amount: 5 ml Lot #: [id number] Solution Expiration Date: 05/25/2022 Secured: Yes Secured With: Steri-Strips Dressing Applied: Yes Primary Dressing: adaptic gauze Procedural Pain: 0 Post Procedural Pain: 0 Response to Treatment: Procedure was tolerated well Level of Consciousness (Post- Awake and Alert procedure): Post Procedure Diagnosis Same as Pre-procedure Electronic Signature(s) Signed: 08/03/2021 5:10:51 PM By: [name] MD Entered By: [name] on 08/03/2021 09:32:30 -------------------------------------------------------------------------------- HPI Details Patient Name: Date of Service: [name], [place]. 08/03/2021 8:30 A M Medical Record Number: [id number] Patient Account Number: [id number] Date of Birth/Sex: Treating RN: 16-May-1941 (80 y.o. [name] Primary Care Provider: [name] Other Clinician: Referring Provider: Treating Provider/[name]: Tressie Stalker in Treatment: 7 History of Present Illness HPI Description: ADMISSION 07/13/2019 Patient is a  80 year old type II diabetic. She has known PAD. She has been followed by Dr. Jacqualyn Posey of podiatry for blistering areas on her toes dating back to 04/30/2019 which at this was the left fourth toe. By 8/11 she had wounds on the right and left second toes. She underwent arterial studies by Dr. Alvester Chou on 7/31 that showed ABIs on the right of 0.60 TBI of 0.26 on the left ABI of 0.56 and a TBI of 0.25. By 9/10  she had ischemic-looking wounds per Dr. Earleen Newport on the right first, left first second and third. She has been using Medihoney and then mupirocin more recently simply Betadine. The patient underwent angiography by Dr. Gwenlyn Found on 9/21. 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 Noted that there was 1 vessel runoff in the foot via the peroneal. On the left there was an 80% left SFA, 70% mid left SFA. There was a short segment calcified CTO distal less than SFA and above-knee popliteal artery reconstituting with two-vessel runoff. 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 It was felt that she had bilateral SFA disease as well as tibial vessel disease. An attempt was made to revascularize the left SFA but they were unable to cross because of the highly calcified nature of the lesion. 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 She has an area on the medial left fourth toe with raised horned callus on top of this. I am not certain what this represents. With regards to pain she has about a 2-1/2 I will claudication tolerance in the grocery store. She has some pain in night which is relieved by putting her feet down over the side of the bed. She is wearing Nitro-Dur patches on the top of her right foot. Past medical history includes type 2 diabetes with secondary PAD, neoplasm  of the skin, diabetic retinopathy, carotid artery stenosis, hyperlipidemia hypertension. 11/2; the last time the patient was here I spoke to Dr. Gwenlyn Found about revascularization options on the right. As I understand things currently Dr. Gwenlyn Found spoke with Dr. Trula Slade and ultimately the patient was taken to surgery on 10/27. She had a right iliofemoral endarterectomy with a bovine patch angioplasty. I think the plan now is for her to have a staged intervention on the right SFA by Dr. Alvester Chou. Per the patient's understanding Dr. Gwenlyn Found and Dr. Jacqualyn Posey are waiting to see when the gangrenous toes on the right foot can be amputated. The patient states her pain is a lot better and she is grateful for that. 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 Small area on the left fourth toe. She is using Betadine to the mummified areas on the right and Medihoney on the left 08/27/2019; the last time I saw this patient I discharged her from the clinic. She had been revascularized by Dr. Trula Slade and she had a right iliofemoral endarterectomy with a bovine angioplasty. 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 Jacqualyn Posey of podiatry on 08/07/2019. I note that she also had a intervention by Dr. Gwenlyn Found and he performed a directional atherectomy and drug-coated balloon angioplasty of the SFA and popliteal artery on the right. I am not certain of the date of Dr. Kennon Holter procedure as of the time of this dictation. She was referred back to [place] by Dr. Earleen Newport predominantly for follow-up of the left fourth toe. She still has sutures and stitches in the right TMA site. She states her pain is a lot better. She expresses concern about the condition of the amputation site at the TMA. She is on doxycycline I think prescribed by Dr. Earleen Newport. She is complaining of some pain at night 12/10; I spoke to Dr.  Wagoner last week. He removed the sutures on the right foot on  Monday of this week. She has the area on the left fourth toe just proximal to the PIP and then the right TMA site. She is still on doxycycline and has enough through next week. Unfortunately the TMA site does not look good at all. Both on the lateral and medial part of the incisions are areas that probe to bone. There is purulence over the medial part which I have cultured. We will use silver alginate. Left fourth toe looks somewhat better but there was still exposed bone 12/17; patient has an MRI booked for 12/30. Culture I did last week showed Pseudomonas Serratia and Enterococcus. This was purulent drainage coming out of the medial part of her amputation site. I use cefdinir 300 twice daily for 10 days that started on 12/15. Her x-ray on the right showed limited evaluation for osteomyelitis. The findings could have been postoperative. There was subtle erosion in the distal first and distal fifth metatarsal. An MRI was suggested. On the left she had irregularity of the fourth middle and proximal phalanx consistent with a history of osteomyelitis. I do not know that she has a history of osteomyelitis in this area. She had a newly defined area on the plantar third toe 12/31; patient's MRI is listed below: MRI OF THE RIGHT FOREFOOT WITHOUT AND WITH CONTRAST TECHNIQUE: Multiplanar, multisequence MR imaging of the right forefoot was performed before and after the administration of intravenous contrast. CONTRAST 6 mL GADAVIST IV SOLN : COMPARISON: Plain films right foot 09/04/2019. FINDINGS: Bones/Joint/Cartilage The patient is status post transmetatarsal amputation as seen on the prior exam. Marrow edema and enhancement are seen in all of the distal metatarsals. In the first metatarsal, signal change extends 1.5 cm proximal to the stump and in the second metatarsal extends approximately 2 cm proximal to the stump. Edema and enhancement are seen in only the distal 0.7 cm of the third metatarsal  stump and tips of the fourth and fifth metatarsals. Bone marrow signal is otherwise unremarkable. A small focus of subchondral edema is seen in the lateral talus, likely degenerative. Ligaments Intact. Muscles and Tendons No intramuscular fluid collection. Soft tissues Skin ulceration is seen off the stump of the first metatarsal. A thin fluid collection tracks deep to the wound and over the anterior metatarsals worrisome for small abscess. Intense subcutaneous edema and enhancement are seen diffusely. 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 Cellulitis about the foot. Skin ulceration over the distal first metatarsal is identified with a thin fluid collection tracking anteriorly along the stump worrisome for abscess. Small focus of subchondral edema in the lateral talus is likely degenerative. Electronically Signed By: Inge Rise M.D. On: 09/23/2019 15:25 Patient arrives in clinic today not complaining any of any pain. 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 She does not describe claudication however her activity is very limited. 10/01/2019; since the patient was last here I have communicated with Dr. Gwenlyn Found after bypass by Dr. Trula Slade and addressing the right superficial femoral artery he states that she is widely patent through peroneal arteries to the ankle with collaterals to the dorsalis pedis. He states he is going to talk to colleagues about the feasibility of tibial pedal access. The patient seems infectious disease later this afternoon Dr. Baxter Flattery. in  preparation for this she has been off antibiotics for 1 week and I went ahead and obtain pieces of the remanence of her first metatarsal for pathology and CandS. The patient is a candidate for hyperbaric  oxygen. 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 1/15; considerable improvement in the wounds on her feet. We are using silver collagen. She follows with Dr. Baxter Flattery of infectious disease and is on meropenem and daptomycin. She has been taught how to do this herself at home. Follows with Dr. Graylon Good at the end of treatment here. She has 2 wounds on the surgical TMA site 1 lateral and 1 medial the lateral 1 has regressed tremendously. The area medially still has some exposed bone although the base of this looks healthy. 1/22; 2 separate wounds on the surgical TMA site. Both of these look satisfactory. The medial area does not have any exposed bone. This is an improvement On the left side fourth toe dorsally over the proximal phalanx there is a deep punched out area that probes to bone. She has an area on the tip of the left third toe. She also tells [place] that in HBO she traumatized her left Achilles and this is left her with a superficial wound area 1/28; weekly visit along with HBO. She has 5 wounds. T our punched out areas on the original TMA site on the right. Both of these appear to have contracted. o The area on the right no longer has exposed bone. She has an area on the tip of the left third toe and on the DIP of the left fourth toe. Both of these had surface callus that I removed and unfortunately they have small areas that both go to bone. She has a traumatic wound on the left Achilles area that happened in HBO and that appears better. She is tolerating her IV antibiotics well at home. She has home health changing the dressings and she is doing it once on the weekends. We have been using silver collagen. She has been extensively revascularized on the right by Dr. Trula Slade and subsequently by Dr. Gwenlyn Found. According to her she has severe PAD on the left but there was nothing that could be done to revascularize this I will need to review these notes 2/5;  the patient will see Dr. Baxter Flattery of infectious disease on 2/17. Dr. Gwenlyn Found on 2/23 and according to her her antibiotics stopped on 2/23. She is tolerating hyperbarics well. She has made a tremendous improvement in the right forefoot with only 2 smaller open wounds. Using silver collagen. On the left foot she has the area on the tip of the left third toe and the medial part of the left fourth toe. These had exposed bone last week I did not sense any of that today 2/12; sees Dr. Graylon Good on 2/17 and Dr. Gwenlyn Found on 2/23. We are going to use Dermagraft on this today however the lateral part of her train TMA incision on the right is healed and the medial part is down so much that we just continue with the silver collagen She has wounds on the tip of her left third and the medial aspect of the left fourth. Both of these still have exposed bone I have not been able to get these to epithelialize. 2/19; she sees Dr. Gwenlyn Found on 2/23 and I have communicated with him about the left vascular supply. Looking at her angiogram from 08/03/2019 it looks as though that they could not  cross the left CFA. Noted that she had one-vessel runoff bilaterally. She also apparently saw Dr. Graylon Good although I did not look up these results for preparation of this record. 2/26; Dr. Gwenlyn Found is going to do an angiogram next week on Wednesday. I think they are going to try to go at this both retrograde and anterograde to see if anything can be done to revascularize the left lower limb. She continues to make nice progress on the remaining wound on the right medial foot on her TMA site and the toe it wounds on the left are responding nicely as well 3/12; the patient underwent an extensive and complicated revascularization on the left leg by Dr. Fletcher Anon on 3/3; she had an atherectomy on occluded left popliteal artery; anterior tibial artery followed by a balloon angioplasty. Atherectomy was also performed to the left SFA because there was still  significant 50% stenosis in the left popliteal artery they performed an intravascular lithotripsy which improved the residual stenosis to 20%. The same lithotripsy was used to dilate the proximal left SFA. She had a drug-eluting stent placed I believe in the SFA. The patient returned for hyperbarics this week. She has had some eye problems on the right which she tells me are secondary to diabetic retinopathy and she saw her eye doctor. She is really made excellent progress there is no open wounds on the left foot at all. She has one open area medially and her TMA site on the right lateral wound is closed. 3/19; the patient comes in today with the area on the medial transmet on the right improved. Some debris removed from the surface revealed the still open wound. Unfortunately she now has the open area on the third toe tip and the medial aspect of the dorsal fourth toe. These are in the same location as her previous wounds. These had actually closed up last week. 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 She is undergone complex revascularizations on the right by Dr. Trula Slade and Dr. Gwenlyn Found and I believe Dr. 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 She completed IVantibiotics as directed by infectious disease and I believe a course of oral antibiotics as well.she underwent a complete 40 treatments of hyperbarics. She comes in today with the vast majority of the right TMA site healed there is a small opening on the most medial aspect. Her left third and left fourth toes appear to have epithelialization although this has fluctuated somewhat. 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 She had a gaping  wound on the right transmetatarsal amputation L as well as deep wounds on the left third and left fourth toes. She underwent complex revascularizations on her bilateral lower legs which I described on her last visit. She completed IV antibiotics by infectious disease and 40 treatments of hyperbarics. Her left third and fourth toes are healed. The area on the right transmetatarsal amputation site is also closed today. READMISSION 08/04/2020 [name] is now a 80 year old woman that we had for a complex stay in our clinic from October 2000 14 January 2020. 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 Since she was last in clinic she had an  MRI in June of this year and underwent a CABG on 03/24/20. Her current problems with wounds began on October 30 when she developed a spontaneous opening over the right transmet medial aspect over the first metatarsal head. She has been using collagen on this that she had leftover from her last stay in this clinic. She also has had an open area on the medial right calf from a vein harvest site from her CABG she. She said that this is never really closed since the vein harvest was done. With regards to her arterial status this is a major issue. She had an angiogram by Dr. Gwenlyn Found in September 2020. She developed a gangrenous right first and second toes. Ultimately she was referred to Dr. Trula Slade who did a right common femoral endarterectomy with patch angioplasty. This was on 07/21/2019 and she really had a good result. Dr. Gwenlyn Found did a atherectomy followed by a drug coated balloon angioplasty of her right SFA popliteal and tibial peroneal trunk on 08/03/2019 again with excellent excellent results. Follow-up.Dopplers in November revealed revealed a widely patent SFA and tibioperoneal trunk. UNFORTUNATELY her recent angiogram that was done on 08/01/2020 showed an 80% right common femoral artery stenosis just above the  previous endarterectomy site occluded right right SFA at the origin reconstituting the adductor canal by the profunda femoris collaterals. The profunda femoris is also diffusely diseased. There is and again another 80% tibioperoneal trunk stenosis and one-vessel runoff via the peroneal artery. She is seen Dr. Donzetta Matters and she is scheduled for a femoral endarterectomy and right femoral-popliteal bypass grafting with endarterectomy of her tibial peroneal trunk. The scheduled surgery is on 11/23 The patient does not complain of pain at rest. She does have 5-minute walking claudication. The wounds on her right first met head remanent was spontaneous she does not think she hit this. As mentioned in the area on her right medial calf is a vein harvest site Dr. Gwenlyn Found had texted me about getting her in the clinic and we have arranged this as soon as we can. I have told her that we will have to see how successful Dr. Donzetta Matters is before we know what can be done about the wounds in a precise fashion. Fortunately there does not seem to be any obvious infection involving the right foot although that may need to be looked at if things really deteriorate. She was treated with IV antibiotics and hyperbaric oxygen for her previous osteomyelitis 12/2; the patient underwent a right femoral endarterectomy and bypass femoral to popliteal artery. This was done on 08/16/2020. She is done remarkably well. She has a small area on the right anterior tibia and a small area in the medial part of her original right transmetatarsal amputation. She appears to have had an excellent response to surgery. 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 Santarelli is a now 80 year old woman who arrives for review of wounds on the left TMA site extending into her plantar foot as well as an area on the left posterior  calcaneus. 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 She was extensively revascularized on the right at that time as noted above in our previous notes ended up receiving IV antibiotics and hyperbaric oxygen therapy. Miraculously this TMA site actually closed and she really has had not  any trouble with this since. UNFORTUNATELY the same cannot be said of her left side. Her problem apparently started sometime in July when she hit her left third toe on a barstool while playing pool. She developed an acute infection which eventually led to underlying osteomyelitis. She underwent an extensive hospitalization from 04/27/2021 through 05/15/2021. She received IV antibiotics. Also extensive intervention by Dr. Stanford Breed of vein and vascular which included a left common femoral to tibioperoneal trunk bypass as well as a left third toe amputation on 05/03/2021. Unfortunately the area did not heal she ended up requiring a left TMA. By her description this was left open. She has been using a wound VAC on this to earlier this week and then was switched to Santyl. I think because of the left forefoot off loader she is also developed an area on the left posterior calcaneus. She is not complaining of a lot of pain. She has her daughter at home to change the dressings. She has been prescribed Santyl and she has a tube that she is brought into our clinic. I cannot get a lot of history of claudication although she is really not that mobile at this point walking with a walker. 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 She has had multiple interventions on the right side by vein and vascular and also by Dr. Gwenlyn Found as well as a transmetatarsal amputation on the right. She has had follow-up noninvasive  studies on 06/06/2021; on the left that showed an ABI of 0.47 with monophasic waveforms a dorsalis pedis ABI of 0.40 with monophasic waveforms. Her ANGIOGRAM which was initially done by Dr. Oneida Alar on 04/28/2021 showed severe left foot superficial femoral artery occlusive disease and an occlusion of the left popliteal artery with two-vessel runoff to the left foot with moderate to severe disease below the knee popliteal artery and tibial disease. She had 80% distal right external iliac artery above the existing femoral above-knee popliteal bypass but she does not have an open wound on the right foot She arrives in clinic with an extensive open area on the TMA site which in the mid aspect extends into the plantar foot. She also has a more superficial area on the upper part of her Achilles area of the left heel 9/29; patient arrives in clinic today with slough over the TMA site. We use MolecuLight to look at the surface of this suggesting cyan and white discoloration over a large part of this wound also extending into the third metatarsal area. She also has the area on the left posterior calcaneus. This is slough covered we will switch to [facility] here as well. There is some warmth in the area and some erythema we will give her antibiotics as well 10/6; completely necrotic surface once again on the left TMA site. We have been using Santyl and Hydrofera Blue. She has an area on the left posterior Achilles and actually states this has been more painful this week indeed there is some erythema around this area. She also describes I think claudication at rest which is relieved by dipping her left leg over the side of the bed at night. 10/13; our intake nurse was able to brush some slough off the surface of the left TMA hence this did not require debridement that is better than last week. The area heading towards the plantar part of her foot required  an aggressive debridement to clean this up. This is quite a  sizable divot but does not go to bone. The area on the right Achilles heel is also covered in a high 100% nonviable tissue we have been using Santyl and silver alginate 10/20; the area on the left TMA looks much better. Even the area is spreading into the midfoot looks improved. The right Achilles still is covered and nonviable surface 100%. We have been using Santyl and silver alginate and this will continue for this week The patient is approved for 5 Apligraf's and I think she is good enough to start using these next week 10/27; the patient's wounds actually look quite good. We have been using Santyl and silver alginate. We applied Apligraf #1 today Saw Dr. Stanford Breed her vascular surgeon on 10/25 he told her TMA was healing nicely. She will follow-up with them in December to repeat noninvasive testing. Electronic Signature(s) Signed: 08/03/2021 5:10:51 PM By: [name] MD Entered By: [name] on 08/03/2021 09:33:49 -------------------------------------------------------------------------------- Physical Exam Details Patient Name: Date of Service: [name], [name] 08/03/2021 8:30 A M Medical Record Number: [id number] Patient Account Number: [id number] Date of Birth/Sex: Treating RN: 1941-02-10 (80 y.o. [name] Primary Care Provider: [name] Other Clinician: Referring Provider: Treating Provider/[name]: Tressie Stalker in Treatment: 7 Constitutional Patient is hypertensive.. Pulse regular and within target range for patient.Marland Kitchen Respirations regular, non-labored and within target range.. Temperature is normal and within the target range for the patient.Marland Kitchen Appears in no distress. Notes Wound exam; substantial wound to the left TMA is measuring better. He has a satellite area that extends into the middle plantar aspect of his foot. Both of these look substantially better. Tissue is very healthy. There is no evidence of infection I  applied Apligraf to these areas Paradoxically the area on the left plantar heel no real improvement. I applied Apligraf to this area as well Electronic Signature(s) Signed: 08/03/2021 5:10:51 PM By: [name] MD Entered By: [name] on 08/03/2021 09:35:20 -------------------------------------------------------------------------------- Physician Orders Details Patient Name: Date of Service: [name], [place]. 08/03/2021 8:30 A M Medical Record Number: [id number] Patient Account Number: [id number] Date of Birth/Sex: Treating RN: [birth year] (80 y.o. 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 - Dr. Dellia Nims Return A Cellular or Tissue Based Products Cellular or Tissue Based Product Type: - Apligraf #2 applied daptic or Mepitel. (DO NOT REMOVE). - May Cellular or Tissue Based Product applied to wound bed, secured with steri-strips, cover with A change secondary dressing only as needed for drainage. DO NOT REMOVE steristrips or clean wound bed Bathing/ Shower/ Hygiene May shower with protection but do not get wound dressing(s) wet. Off-Loading Open toe surgical [name] to: - right foot Wedge [name] to: - left foot Home Health No change in wound care orders this week; continue Home Health for wound care. May  utilize formulary equivalent dressing for wound treatment orders unless otherwise specified. - apligraf applied.  May change secondary dressing only as needed for drainage Other Home Health Orders/Instructions: - [facility] 3x a week Wound Treatment Wound #13 - Amputation Site - Transmetatarsal Wound Laterality: Left Cleanser: Soap and Water 1 x Per Week/7 Days Discharge Instructions: May shower and wash wound with dial antibacterial soap and water prior to dressing change. Cleanser: Wound Cleanser (Home Health) 1 x Per Week/7 Days Discharge Instructions: Cleanse the wound with wound cleanser or normal saline prior to applying a clean dressing using gauze sponges, not tissue or cotton balls. Prim Dressing: Apligraf ary 1 x Per Week/7 Days Secondary Dressing: Woven Gauze Sponge, Non-Sterile 4x4 in (Home Health) 1 x Per Week/7 Days Discharge Instructions: Apply over primary dressing as directed. Secondary Dressing: ABD Pad, 5x9 (Home Health) 1 x Per Week/7 Days Discharge Instructions: Apply over primary dressing as directed. Secondary Dressing: ADAPTIC TOUCH 3x4.25 in 1 x Per Week/7 Days Discharge Instructions: Apply over primary dressing and secured with steristrips Secured With: Kerlix Roll Sterile, 4.5x3.1 (in/yd) (Home Health) 1 x Per Week/7 Days Discharge Instructions: Secure with Kerlix as directed. Secured With: Paper Tape, 2x10 (in/yd) (Home Health) 1 x Per Week/7 Days Discharge Instructions: Secure dressing with tape as directed. Wound #14 - Calcaneus Wound Laterality: Left, Posterior Cleanser: Soap and Water 1 x Per Week/7 Days Discharge Instructions: May shower and wash wound with dial antibacterial soap and water prior to dressing change. Cleanser: Wound Cleanser (Home Health) 1 x Per Week/7 Days Discharge Instructions: Cleanse the wound with wound cleanser or normal saline prior to applying a clean dressing using gauze sponges, not tissue or cotton balls. Prim Dressing:  Apligraf ary 1 x Per Week/7 Days Secondary Dressing: Woven Gauze Sponge, Non-Sterile 4x4 in (Home Health) 1 x Per Week/7 Days Discharge Instructions: Apply over primary dressing as directed. Secondary Dressing: ALLEVYN Heel 4 1/2in x 5 1/2in / 10.5cm x 13.5cm (Home Health) 1 x Per Week/7 Days Discharge Instructions: Apply over primary dressing as directed. Secondary Dressing: ADAPTIC TOUCH 3x4.25 in 1 x Per Week/7 Days Discharge Instructions: Apply over primary dressing and secured with steristrips Secured With: Kerlix Roll Sterile, 4.5x3.1 (in/yd) (Home Health) 1 x Per Week/7 Days Discharge Instructions: Secure with Kerlix as directed. Secured With: Paper Tape, 2x10 (in/yd) (Home Health) 1 x Per Week/7 Days Discharge Instructions: Secure dressing with tape as directed. Electronic Signature(s) Signed: 08/03/2021 5:10:51 PM By: [name] MD Signed: 08/03/2021 5:16:08 PM By: Deon Pilling RN, BSN Entered By: Deon Pilling on 08/03/2021 09:12:23 -------------------------------------------------------------------------------- Problem List Details Patient Name: Date of Service: [name], [place]. 08/03/2021 8:30 A M Medical Record Number: [id number] Patient Account Number: [id number] Date of Birth/Sex: Treating RN: [birth year] (80 y.o. [name], [name] Primary Care Provider: [name] Other Clinician: Referring Provider: Treating Provider/[name]: Tressie Stalker in Treatment: 7 Active Problems ICD-10 Encounter Code Description Active Date MDM Diagnosis E11.621 Type 2 diabetes mellitus with foot ulcer 06/15/2021 No Yes T81.31XD Disruption of external operation (surgical) wound, not elsewhere classified, 06/15/2021 No Yes subsequent encounter E11.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 06/15/2021 No Yes L97.528 Non-pressure chronic ulcer of other part of left foot with other specified 06/15/2021 No  Yes severity L97.428 Non-pressure chronic ulcer of left heel and midfoot with other specified 06/15/2021 No Yes severity Inactive Problems Resolved Problems Electronic Signature(s) Signed: 08/03/2021 5:10:51 PM By: [name] MD Entered By: [name] on 08/03/2021 09:31:54 -------------------------------------------------------------------------------- Progress Note Details Patient Name: Date of Service: [name], [place].  08/03/2021 8:30 A M Medical Record Number: [id number] Patient Account Number: [id number] Date of Birth/Sex: Treating RN: July 30, 1941 (80 y.o. [name] Primary Care Provider: [name] Other Clinician: Referring Provider: Treating Provider/[name]: Tressie Stalker in Treatment: 7 Subjective History of Present Illness (HPI) ADMISSION 07/13/2019 Patient is a 80 year old type II diabetic. She has known PAD. She has been followed by Dr. Jacqualyn Posey of podiatry for blistering areas on her toes dating back to 04/30/2019 which at this was the left fourth toe. By 8/11 she had wounds on the right and left second toes. She underwent arterial studies by Dr. Alvester Chou on 7/31 that showed ABIs on the right of 0.60 TBI of 0.26 on the left ABI of 0.56 and a TBI of 0.25. By 9/10 she had ischemic-looking wounds per Dr. Earleen Newport on the right first, left first second and third. She has been using Medihoney and then mupirocin more recently simply Betadine. The patient underwent angiography by Dr. Gwenlyn Found on 9/21. 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 Noted that there was 1 vessel runoff in the foot via the peroneal. ooOn the left there was an 80% left SFA, 70% mid left SFA. There was a short segment calcified CTO distal less than SFA and above-knee popliteal artery reconstituting with two-vessel runoff. The posterior tibial  artery was occluded. It was felt that she had bilateral SFA disease as well as tibial vessel disease. An attempt was made to revascularize the left SFA but they were unable to cross because of the highly calcified nature of the lesion. 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 She has an area on the medial left fourth toe with raised horned callus on top of this. I am not certain what this represents. With regards to pain she has about a 2-1/2 I will claudication tolerance in the grocery store. She has some pain in night which is relieved by putting her feet down over the side of the bed. She is wearing Nitro-Dur patches on the top of her right foot. Past medical history includes type 2 diabetes with secondary PAD, neoplasm of the skin, diabetic retinopathy, carotid artery stenosis, hyperlipidemia hypertension. 11/2; the last time the patient was here I spoke to Dr. Gwenlyn Found about revascularization options on the right. As I understand things currently Dr. Gwenlyn Found spoke with Dr. Trula Slade and ultimately the patient was taken to surgery on 10/27. She had a right iliofemoral endarterectomy with a bovine patch angioplasty. I think the plan now is for her to have a staged intervention on the right SFA by Dr. Alvester Chou. Per the patient's understanding Dr. Gwenlyn Found and Dr. Jacqualyn Posey are waiting to see when the gangrenous toes on the right foot can be amputated. The patient states her pain is a lot better and she is grateful for that. 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 Small area on the left fourth toe. She is using Betadine to the mummified areas on the right and Medihoney on the left 08/27/2019; the last time I saw this patient I discharged her from the clinic. She had been revascularized by Dr. Trula Slade and she had a right iliofemoral endarterectomy with  a bovine angioplasty. 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 Jacqualyn Posey of podiatry on 08/07/2019. I note that she also had a intervention by Dr. Gwenlyn Found and he performed a directional atherectomy and drug-coated balloon angioplasty of the SFA and popliteal artery on the right. I am not certain of the date of Dr. Kennon Holter procedure as of the time of this dictation. She was referred back to [place] by Dr. Earleen Newport predominantly for follow-up of the left fourth toe. She still has sutures and stitches in the right TMA site. She states her pain is a lot better. She expresses concern about the condition of the amputation site at the TMA. She is on doxycycline I think prescribed by Dr. Earleen Newport. She is complaining of some pain at night 12/10; I spoke to Dr. Jacqualyn Posey last week. He removed the sutures on the right foot on Monday of this week. She has the area on the left fourth toe just proximal to the PIP and then the right TMA site. She is still on doxycycline and has enough through next week. Unfortunately the TMA site does not look good at all. Both on the lateral and medial part of the incisions are areas that probe to bone. There is purulence over the medial part which I have cultured. We will use silver alginate. Left fourth toe looks somewhat better but there was still exposed bone 12/17; patient has an MRI booked for 12/30. Culture I did last week showed Pseudomonas Serratia and Enterococcus. This was purulent drainage coming out of the medial part of her amputation site. I use cefdinir 300 twice daily for 10 days that started on 12/15. Her x-ray on the right showed limited evaluation for osteomyelitis. The findings could have been postoperative. There was subtle erosion in the distal first and distal fifth metatarsal. An MRI was suggested. On the left she had irregularity of the fourth middle and proximal phalanx consistent with a history of osteomyelitis. I do not  know that she has a history of osteomyelitis in this area. She had a newly defined area on the plantar third toe 12/31; patient's MRI is listed below: MRI OF THE RIGHT FOREFOOT WITHOUT AND WITH CONTRAST TECHNIQUE: Multiplanar, multisequence MR imaging of the right forefoot was performed before and after the administration of intravenous contrast. CONTRAST 6 mL GADAVIST IV SOLN : COMPARISON: Plain films right foot 09/04/2019. FINDINGS: Bones/Joint/Cartilage The patient is status post transmetatarsal amputation as seen on the prior exam. Marrow edema and enhancement are seen in all of the distal metatarsals. In the first metatarsal, signal change extends 1.5 cm proximal to the stump and in the second metatarsal extends approximately 2 cm proximal to the stump. Edema and enhancement are seen in only the distal 0.7 cm of the third metatarsal stump and tips of the fourth and fifth metatarsals. Bone marrow signal is otherwise unremarkable. A small focus of subchondral edema is seen in the lateral talus, likely degenerative. Ligaments Intact. Muscles and Tendons No intramuscular fluid collection. Soft tissues Skin ulceration is seen off the stump of the first metatarsal. A thin fluid collection tracks deep to the wound and over the anterior metatarsals worrisome for small abscess. Intense subcutaneous edema and enhancement are seen diffusely. 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 Cellulitis about the foot. Skin ulceration over the distal first metatarsal is identified with a thin fluid collection tracking anteriorly along the stump worrisome for abscess.  Small focus of subchondral edema in the lateral talus is likely degenerative. Electronically Signed By: Inge Rise M.D. On: 09/23/2019 15:25 Patient arrives in clinic today not  complaining any of any pain. 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 She does not describe claudication however her activity is very limited. 10/01/2019; since the patient was last here I have communicated with Dr. Gwenlyn Found after bypass by Dr. Trula Slade and addressing the right superficial femoral artery he states that she is widely patent through peroneal arteries to the ankle with collaterals to the dorsalis pedis. He states he is going to talk to colleagues about the feasibility of tibial pedal access. The patient seems infectious disease later this afternoon Dr. Baxter Flattery. in preparation for this she has been off antibiotics for 1 week and I went ahead and obtain pieces of the remanence of her first metatarsal for pathology and CandS. The patient is a candidate for hyperbaric oxygen. 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 1/15; considerable improvement in the wounds on her feet. We are using silver collagen. She follows with Dr. Baxter Flattery of infectious disease and is on meropenem and daptomycin. She has been taught how to do this herself at home. Follows with Dr. Graylon Good at the end of treatment here. She has 2 wounds on the surgical TMA site 1 lateral and 1 medial the lateral 1 has regressed tremendously. The area medially still has some exposed bone although the base of this looks healthy. 1/22; 2 separate wounds on the surgical TMA site. Both of these look satisfactory. The medial area does not have any exposed bone. This is an improvement On the left side fourth toe dorsally over the proximal phalanx there is a deep punched out area that probes to bone. She has an area on the tip of the left third toe. She also tells [place] that in HBO she traumatized her left Achilles and this is left her with a superficial wound area 1/28; weekly visit along with HBO. She has 5 wounds. T our punched  out areas on the original TMA site on the right. Both of these appear to have contracted. o The area on the right no longer has exposed bone. She has an area on the tip of the left third toe and on the DIP of the left fourth toe. Both of these had surface callus that I removed and unfortunately they have small areas that both go to bone. She has a traumatic wound on the left Achilles area that happened in HBO and that appears better. She is tolerating her IV antibiotics well at home. She has home health changing the dressings and she is doing it once on the weekends. We have been using silver collagen. She has been extensively revascularized on the right by Dr. Trula Slade and subsequently by Dr. Gwenlyn Found. 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 Baxter Flattery of infectious disease on 2/17. Dr. Gwenlyn Found on 2/23 and according to her her antibiotics stopped on 2/23. She is tolerating hyperbarics well. She has made a tremendous improvement in the right forefoot with only 2 smaller open wounds. Using silver collagen. On the left foot she has the area on the tip of the left third toe and the medial part of the left  fourth toe. These had exposed bone last week I did not sense any of that today 2/12; sees Dr. Graylon Good on 2/17 and Dr. Gwenlyn Found on 2/23. We are going to use Dermagraft on this today however the lateral part of her train TMA incision on the right is healed and the medial part is down so much that we just continue with the silver collagen She has wounds on the tip of her left third and the medial aspect of the left fourth. Both of these still have exposed bone I have not been able to get these to epithelialize. 2/19; she sees Dr. Gwenlyn Found on 2/23 and I have communicated with him about the left vascular supply. Looking at her angiogram from 08/03/2019 it looks as though that they could not  cross the left CFA. Noted that she had one-vessel runoff bilaterally. She also apparently saw Dr. Graylon Good although I did not look up these results for preparation of this record. 2/26; Dr. Gwenlyn Found is going to do an angiogram next week on Wednesday. I think they are going to try to go at this both retrograde and anterograde to see if anything can be done to revascularize the left lower limb. She continues to make nice progress on the remaining wound on the right medial foot on her TMA site and the toe it wounds on the left are responding nicely as well 3/12; the patient underwent an extensive and complicated revascularization on the left leg by Dr. Fletcher Anon on 3/3; she had an atherectomy on occluded left popliteal artery; anterior tibial artery followed by a balloon angioplasty. Atherectomy was also performed to the left SFA because there was still significant 50% stenosis in the left popliteal artery they performed an intravascular lithotripsy which improved the residual stenosis to 20%. The same lithotripsy was used to dilate the proximal left SFA. She had a drug-eluting stent placed I believe in the SFA. The patient returned for hyperbarics this week. She has had some eye problems on the right which she tells me are secondary to diabetic retinopathy and she saw her eye doctor. She is really made excellent progress there is no open wounds on the left foot at all. She has one open area medially and her TMA site on the right lateral wound is closed. 3/19; the patient comes in today with the area on the medial transmet on the right improved. Some debris removed from the surface revealed the still open wound. ooUnfortunately she now has the open area on the third toe tip and the medial aspect of the dorsal fourth toe. These are in the same location as her previous wounds. These had actually closed up last week. 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 She is  undergone complex revascularizations on the right by Dr. Trula Slade and Dr. Gwenlyn Found and I believe Dr. 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 She completed IVantibiotics as directed by infectious disease and I believe a course of oral antibiotics as well.she underwent a complete 40 treatments of hyperbarics. She comes in today with the vast majority of the right TMA site healed there is a small opening on the most medial aspect. Her left third and left fourth toes appear to have epithelialization although  this has fluctuated somewhat. 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 She had a gaping wound on the right transmetatarsal amputation L as well as deep wounds on the left third and left fourth toes. She underwent complex revascularizations on her bilateral lower legs which I described on her last visit. She completed IV antibiotics by infectious disease and 40 treatments of hyperbarics. Her left third and fourth toes are healed. The area on the right transmetatarsal amputation site is also closed today. READMISSION 08/04/2020 [name] is now a 80 year old woman that we had for a complex stay in our clinic from October 2000 14 January 2020. 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 Her current problems with wounds began on October 30 when she developed a spontaneous opening over the right transmet medial aspect over the first metatarsal head. She has been using collagen on this that she had leftover from her last stay in this clinic. She also has had an open area on the medial right calf from a vein  harvest site from her CABG she. She said that this is never really closed since the vein harvest was done. With regards to her arterial status this is a major issue. She had an angiogram by Dr. Gwenlyn Found in September 2020. She developed a gangrenous right first and second toes. Ultimately she was referred to Dr. Trula Slade who did a right common femoral endarterectomy with patch angioplasty. This was on 07/21/2019 and she really had a good result. Dr. Gwenlyn Found did a atherectomy followed by a drug coated balloon angioplasty of her right SFA popliteal and tibial peroneal trunk on 08/03/2019 again with excellent excellent results. Follow-up.Dopplers in November revealed revealed a widely patent SFA and tibioperoneal trunk. UNFORTUNATELY her recent angiogram that was done on 08/01/2020 showed an 80% right common femoral artery stenosis just above the previous endarterectomy site occluded right right SFA at the origin reconstituting the adductor canal by the profunda femoris collaterals. The profunda femoris is also diffusely diseased. There is and again another 80% tibioperoneal trunk stenosis and one-vessel runoff via the peroneal artery. She is seen Dr. Donzetta Matters and she is scheduled for a femoral endarterectomy and right femoral-popliteal bypass grafting with endarterectomy of her tibial peroneal trunk. The scheduled surgery is on 11/23 The patient does not complain of pain at rest. She does have 5-minute walking claudication. The wounds on her right first met head remanent was spontaneous she does not think she hit this. As mentioned in the area on her right medial calf is a vein harvest site Dr. Gwenlyn Found had texted me about getting her in the clinic and we have arranged this as soon as we can. I have told her that we will have to see how successful Dr. Donzetta Matters is before we know what can be done about the wounds in a precise fashion. Fortunately there does not seem to be any obvious infection involving the right foot  although that may need to be looked at if things really deteriorate. She was treated with IV antibiotics and hyperbaric oxygen for her previous osteomyelitis 12/2; the patient underwent a right femoral endarterectomy and bypass femoral to popliteal artery. This was done on 08/16/2020. She is done remarkably well. She has a small area on the right anterior tibia and a small area in the medial part of her original right  transmetatarsal amputation. She appears to have had an excellent response to surgery. 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 Heidel is a now 80 year old woman who arrives for review of wounds on the left TMA site extending into her plantar foot as well as an area on the left posterior calcaneus. 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 She was extensively revascularized on the right at that time as noted above in our previous notes ended up receiving IV antibiotics and hyperbaric oxygen therapy. Miraculously this TMA site actually closed and she really has had not any trouble with this since. UNFORTUNATELY the same cannot be said of her left side. Her problem apparently started sometime in July when she hit her left third toe on a barstool while playing pool. She developed an acute infection which eventually led to underlying osteomyelitis. She underwent an extensive hospitalization from 04/27/2021 through 05/15/2021. She received IV antibiotics. Also extensive intervention by Dr. Stanford Breed of vein and vascular which included a left common femoral to tibioperoneal trunk bypass as well as a left third toe amputation on 05/03/2021. Unfortunately the area did not heal she ended up requiring a left TMA. By her description this was left open. She has been using a wound VAC  on this to earlier this week and then was switched to Santyl. I think because of the left forefoot off loader she is also developed an area on the left posterior calcaneus. She is not complaining of a lot of pain. She has her daughter at home to change the dressings. She has been prescribed Santyl and she has a tube that she is brought into our clinic. I cannot get a lot of history of claudication although she is really not that mobile at this point walking with a walker. 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 She has had multiple interventions on the right side by vein and vascular and also by Dr. Gwenlyn Found as well as a transmetatarsal amputation on the right. She has had follow-up noninvasive studies on 06/06/2021; on the left that showed an ABI of 0.47 with monophasic waveforms a dorsalis pedis ABI of 0.40 with monophasic waveforms. Her ANGIOGRAM which was initially done by Dr. Oneida Alar on 04/28/2021 showed severe left foot superficial femoral artery occlusive disease and an occlusion of the left popliteal artery with two-vessel runoff to the left foot with moderate to severe disease below the knee popliteal artery and tibial disease. She had 80% distal right external iliac artery above the existing femoral above-knee popliteal bypass but she does not have an open wound on the right foot She arrives in clinic with an extensive open area on the TMA site which in the mid aspect extends into the plantar foot. She also has a more superficial area on the upper part of her Achilles area of the left heel 9/29; patient arrives in clinic today with slough over the TMA site. We use MolecuLight to look at the surface of this suggesting cyan and white discoloration over a large part of this wound also extending into the third metatarsal area. She also has the area on the left  posterior calcaneus. This is slough covered we  will switch to [facility] here as well. There is some warmth in the area and some erythema we will give her antibiotics as well 10/6; completely necrotic surface once again on the left TMA site. We have been using Santyl and Hydrofera Blue. She has an area on the left posterior Achilles and actually states this has been more painful this week indeed there is some erythema around this area. She also describes I think claudication at rest which is relieved by dipping her left leg over the side of the bed at night. 10/13; our intake nurse was able to brush some slough off the surface of the left TMA hence this did not require debridement that is better than last week. The area heading towards the plantar part of her foot required an aggressive debridement to clean this up. This is quite a sizable divot but does not go to bone. The area on the right Achilles heel is also covered in a high 100% nonviable tissue we have been using Santyl and silver alginate 10/20; the area on the left TMA looks much better. Even the area is spreading into the midfoot looks improved. The right Achilles still is covered and nonviable surface 100%. We have been using Santyl and silver alginate and this will continue for this week The patient is approved for 5 Apligraf's and I think she is good enough to start using these next week 10/27; the patient's wounds actually look quite good. We have been using Santyl and silver alginate. We applied Apligraf #1 today Saw Dr. Stanford Breed her vascular surgeon on 10/25 he told her TMA was healing nicely. She will follow-up with them in December to repeat noninvasive testing. Objective Constitutional Patient is hypertensive.. Pulse regular and within target range for patient.Marland Kitchen Respirations regular, non-labored and within target range.. Temperature is normal and within the target range for the patient.Marland Kitchen Appears in no distress. Vitals Time Taken: 8:40  AM, Height: 64 in, Weight: 135 lbs, BMI: 23.2, Temperature: 97.7 F, Pulse: 79 bpm, Respiratory Rate: 16 breaths/min, Blood Pressure: 154/79 mmHg, Capillary Blood Glucose: 95 mg/dl. General Notes: Wound exam; substantial wound to the left TMA is measuring better. He has a satellite area that extends into the middle plantar aspect of his foot. Both of these look substantially better. Tissue is very healthy. There is no evidence of infection I applied Apligraf to these areas oo Paradoxically the area on the left plantar heel no real improvement. I applied Apligraf to this area as well Integumentary (Hair, Skin) Wound #13 status is Open. Original cause of wound was Surgical Injury. The date acquired was: 05/11/2021. The wound has been in treatment 7 weeks. The wound is located on the Left Amputation Site - Transmetatarsal. The wound measures 4.5cm length x 5.2cm width x 0.5cm depth; 18.378cm^2 area and 9.189cm^3 volume. There is Fat Layer (Subcutaneous Tissue) exposed. There is no tunneling or undermining noted. There is a medium amount of serosanguineous drainage noted. The wound margin is distinct with the outline attached to the wound base. There is large (67-100%) red granulation within the wound bed. There is a small (1-33%) amount of necrotic tissue within the wound bed including Adherent Slough. Wound #14 status is Open. Original cause of wound was Pressure Injury. The date acquired was: 05/11/2021. The wound has been in treatment 7 weeks. The wound is located on the Left,Posterior Calcaneus. The wound measures 0.8cm length x 1.2cm width x 0.1cm depth; 0.754cm^2 area and 0.075cm^3 volume. There is Fat Layer (Subcutaneous Tissue) exposed.  There is no tunneling or undermining noted. There is a medium amount of serous drainage noted. The wound margin is flat and intact. There is medium (34-66%) red granulation within the wound bed. There is a medium (34-66%) amount of necrotic tissue within the wound  bed including Adherent Slough. 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 A skin graft procedure using a bioengineered skin substitute/cellular or tissue based product was performed by Ricard Dillon., MD with the following instrument(s): Blade, Forceps, and Scissors. Apligraf was applied and secured with Steri-Strips. 40 sq cm of product was utilized and 0 sq cm was wasted. Post Application, adaptic gauze was applied. A Time Out was conducted at 09:07, prior to the start of the procedure. The procedure was tolerated well with a pain level of 0 throughout and a pain level of 0 following the procedure. Post procedure Diagnosis Wound #13: Same as Pre-Procedure . Wound #14 Pre-procedure diagnosis of Wound #14 is a Diabetic Wound/Ulcer of the Lower Extremity located on the Left,Posterior Calcaneus. A skin graft procedure using a bioengineered skin substitute/cellular or tissue based product was performed by Ricard Dillon., MD with the following instrument(s): Blade, Forceps, and Scissors. Apligraf was applied and secured with Steri-Strips. 4 sq cm of product was utilized and 0 sq cm was wasted. Post Application, adaptic gauze was applied. A Time Out was conducted at 09:07, prior to the start of the procedure. The procedure was tolerated well with a pain level of 0 throughout and a pain level of 0 following the procedure. Post procedure Diagnosis Wound #14: Same as  Pre-Procedure . Plan Follow-up Appointments: Return Appointment in 2 weeks. - Dr. Dellia Nims Cellular or Tissue Based Products: Cellular or Tissue Based Product Type: - Apligraf #2 applied Cellular or Tissue Based Product applied to wound bed, secured with steri-strips, cover with Adaptic or Mepitel. (DO NOT REMOVE). - May change secondary dressing only as needed for drainage. DO NOT REMOVE steristrips or clean wound bed Bathing/ Shower/ Hygiene: May shower with protection but do not get wound dressing(s) wet. Off-Loading: Open toe surgical [name] to: - right foot Wedge [name] to: - left foot Home Health: No change in wound care orders this week; continue Home Health for wound care. May utilize formulary equivalent dressing for wound treatment orders unless otherwise specified. - apligraf applied. May change secondary dressing only as needed for drainage Other Home Health Orders/Instructions: - [facility] 3x a week WOUND #13: - Amputation Site - Transmetatarsal Wound Laterality: Left Cleanser: Soap and Water 1 x Per Week/7 Days Discharge Instructions: May shower and wash wound with dial antibacterial soap and water prior to dressing change. Cleanser: Wound Cleanser (Home Health) 1 x Per Week/7 Days Discharge Instructions: Cleanse the wound with wound cleanser or normal saline prior to applying a clean dressing using gauze sponges, not tissue or cotton balls. Prim Dressing: Apligraf 1 x Per Week/7 Days ary Secondary Dressing: Woven Gauze Sponge, Non-Sterile 4x4 in (Home Health) 1 x Per Week/7 Days Discharge Instructions: Apply over primary dressing as directed. Secondary Dressing: ABD Pad, 5x9 (Home Health)  1 x Per Week/7 Days Discharge Instructions: Apply over primary dressing as directed. Secondary Dressing: ADAPTIC TOUCH 3x4.25 in 1 x Per Week/7 Days Discharge Instructions: Apply over primary dressing and secured with steristrips Secured With: Kerlix Roll Sterile, 4.5x3.1 (in/yd) (Home Health)  1 x Per Week/7 Days Discharge Instructions: Secure with Kerlix as directed. Secured With: Paper T ape, 2x10 (in/yd) (Home Health) 1 x Per Week/7 Days Discharge Instructions: Secure dressing with tape as directed. WOUND #14: - Calcaneus Wound Laterality: Left, Posterior Cleanser: Soap and Water 1 x Per Week/7 Days Discharge Instructions: May shower and wash wound with dial antibacterial soap and water prior to dressing change. Cleanser: Wound Cleanser (Home Health) 1 x Per Week/7 Days Discharge Instructions: Cleanse the wound with wound cleanser or normal saline prior to applying a clean dressing using gauze sponges, not tissue or cotton balls. Prim Dressing: Apligraf 1 x Per Week/7 Days ary Secondary Dressing: Woven Gauze Sponge, Non-Sterile 4x4 in (Home Health) 1 x Per Week/7 Days Discharge Instructions: Apply over primary dressing as directed. Secondary Dressing: ALLEVYN Heel 4 1/2in x 5 1/2in / 10.5cm x 13.5cm (Home Health) 1 x Per Week/7 Days Discharge Instructions: Apply over primary dressing as directed. Secondary Dressing: ADAPTIC TOUCH 3x4.25 in 1 x Per Week/7 Days Discharge Instructions: Apply over primary dressing and secured with steristrips Secured With: Kerlix Roll Sterile, 4.5x3.1 (in/yd) (Home Health) 1 x Per Week/7 Days Discharge Instructions: Secure with Kerlix as directed. Secured With: Paper T ape, 2x10 (in/yd) (Home Health) 1 x Per Week/7 Days Discharge Instructions: Secure dressing with tape as directed. 1. Apligraf #2 applied in standard fashion 2. Her substantial wound on the left TMA including the area in the mid incision that goes towards the bottom part of her plantar foot all look to be considerably improved this includes condition of the surface as well as some measurement. The area on the left posterior heel not quite as good. May require debridement here Electronic Signature(s) Signed: 08/03/2021 5:10:51 PM By: [name] MD Entered By: [name]  on 08/03/2021 09:36:15 -------------------------------------------------------------------------------- SuperBill Details Patient Name: Date of Service: [name], [name] 08/03/2021 Medical Record Number: [id number] Patient Account Number: [id number] Date of Birth/Sex: Treating RN: 1941-01-31 (80 y.o. 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 Facility Procedures CPT4 Code: [id number] Description: (Facility Use Only) Apligraf 1 SQ CM Modifier: Quantity: [place] Code: [id number] Description: 00349 - SKIN SUB GRAFT FACE/NK/HF/G ICD-10 Diagnosis Description T81.31XD Disruption of external operation (surgical) wound, not elsewhere classified, subs L97.428 Non-pressure chronic ulcer of left heel and midfoot with other specified  severity Modifier: equent encounter Quantity: 1 Physician Procedures : CPT4 Code Description Modifier [id number] - WC PHYS SKIN SUB GRAFT FACE/NK/HF/G ICD-10 Diagnosis Description T81.31XD Disruption of external operation (surgical) wound, not elsewhere classified, subsequent encounter L97.428 Non-pressure chronic  ulcer of left heel and midfoot with other specified severity Quantity: 1 Electronic Signature(s) Signed: 08/03/2021 5:10:51 PM By: [name] MD Entered By: [name] on 08/03/2021 09:36:30

## 2021-08-03 NOTE — Progress Notes (Signed)
[name] ([id number]) Visit Report for 08/03/2021 Arrival Information Details Patient Name: Date of Service: [name] 08/03/2021 8:30 A M Medical Record Number: [id number] Patient Account Number: [id number] Date of Birth/Sex: Treating RN: 02/01/1941 (80 y.o. [name], [name] Primary Care Moselle Rister: [name] Other Clinician: Referring Alan Drummer: Treating Demarqus Jocson/[name]: Tressie Stalker in Treatment: 7 Visit Information History Since Last Visit Added or deleted any medications: No Patient Arrived: [name]: No Arrival Time: 08:40 Had a fall or experienced change in No Accompanied By: self activities of daily living that may affect Transfer Assistance: None risk of falls: Patient Identification Verified: Yes Signs or symptoms of abuse/neglect since last visito No Secondary Verification Process Completed: Yes Hospitalized since last visit: No Patient Requires Transmission-Based Precautions: No Implantable device outside of the clinic excluding No Patient Has Alerts: Yes cellular tissue based products placed in the center Patient Alerts: Patient on Blood Thinner since last visit: L ABI: 0.82 Has Dressing in Place as Prescribed: Yes R ABI: 0.47 Has Footwear/Offloading in Place as Prescribed: Yes Left: Wedge Shoe Pain Present Now: No Electronic Signature(s) Signed: 08/03/2021 5:16:08 PM By: Deon Pilling RN, BSN Entered By: Deon Pilling on 08/03/2021 08:44:47 -------------------------------------------------------------------------------- Encounter Discharge Information Details Patient Name: Date of Service: [name], [place]. 08/03/2021 8:30 A M Medical Record Number: [id number] Patient Account Number: [id number] Date of Birth/Sex: Treating RN: 05-13-1941 (80 y.o. [name] Primary Care Demetric Dunnaway: [name] Other Clinician: Referring Chayse Zatarain: Treating Daniel Johndrow/[name]:  Tressie Stalker in Treatment: 7 Encounter Discharge Information Items Post Procedure Vitals Discharge Condition: Stable Temperature (F): 97.7 Ambulatory Status: Walker Pulse (bpm): 79 Discharge Destination: Home Respiratory Rate (breaths/min): 18 Transportation: Private Auto Blood Pressure (mmHg): 154/79 Accompanied By: SELF Schedule Follow-up Appointment: Yes Clinical Summary of Care: Patient Declined Electronic Signature(s) Signed: 08/03/2021 5:16:08 PM By: Deon Pilling RN, BSN Entered By: Deon Pilling on 08/03/2021 09:27:37 -------------------------------------------------------------------------------- Lower Extremity Assessment Details Patient Name: Date of Service: [name], [place]. 08/03/2021 8:30 A M Medical Record Number: [id number] Patient Account Number: [id number] Date of Birth/Sex: Treating RN: November 10, 1940 (80 y.o. [name], [name] Primary Care Abshir Paolini: [name] Other Clinician: Referring Rodderick Holtzer: Treating Charliene Inoue/[name]: Tressie Stalker in Treatment: 7 Edema Assessment Assessed: Shirlyn Goltz: No] [Right: No] Edema: [Left: N] [Right: o] Calf Left: Right: Point of Measurement: From Medial Instep 33.5 cm Ankle Left: Right: Point of Measurement: From Medial Instep 20 cm Vascular Assessment Pulses: Dorsalis Pedis Palpable: [Left:No] Electronic Signature(s) Signed: 08/03/2021 5:16:08 PM By: Deon Pilling RN, BSN Entered By: Deon Pilling on 08/03/2021 08:58:15 -------------------------------------------------------------------------------- Multi Wound Chart Details Patient Name: Date of Service: [name], [place]. 08/03/2021 8:30 A M Medical Record Number: [id number] Patient Account Number: [id number] Date of Birth/Sex: Treating RN: 21-Oct-1940 (80 y.o. [name] Primary Care Reshonda Koerber: [name] Other Clinician: Referring Averyanna Sax: Treating Leanndra Pember/[name]: Tressie Stalker in Treatment: 7 Vital Signs Height(in): 89 Capillary Blood Glucose(mg/dl): 95 Weight(lbs): 135 Pulse(bpm): 23 Body Mass Index(BMI): 23 Blood Pressure(mmHg): 154/79 Temperature(F): 97.7 Respiratory Rate(breaths/min): 16 Photos: [13:Left Amputation Site - Transmetatarsal Left, Posterior Calcaneus] [N/A:N/A N/A] Wound Location: [13:Surgical Injury] [14:Pressure Injury] [N/A:N/A] Wounding Event: [13:Diabetic Wound/Ulcer of the Lower] [14:Diabetic Wound/Ulcer of the Lower] [N/A:N/A] Primary Etiology: [13:Extremity Open Surgical Wound] [14:Extremity N/A] [N/A:N/A] Secondary Etiology: [13:Coronary Artery Disease, Deep Vein Coronary Artery Disease, Deep Vein N/A] Comorbid History: [13:Thrombosis, Hypertension, Myocardial Thrombosis, Hypertension, Myocardial Infarction, Peripheral Arterial Disease, Infarction,  Peripheral Arterial Disease, Type II Diabetes 05/11/2021] [14:Type II Diabetes 05/11/2021] [N/A:N/A] Date Acquired: [13:7] [14:7] [N/A:N/A] Weeks of Treatment: [13:Open] [14:Open] [N/A:N/A] Wound Status: [13:4.5x5.2x0.5] [14:0.8x1.2x0.1] [N/A:N/A] Measurements L x W x D (cm) [13:18.378] [14:0.754] [N/A:N/A] A (cm) : rea [13:9.189] [14:0.075] [N/A:N/A] Volume (cm) : [13:52.00%] [14:45.40%] [N/A:N/A] % Reduction in A rea: [13:73.30%] [14:45.70%] [N/A:N/A] % Reduction in Volume: [13:Grade 2] [14:Grade 1] [N/A:N/A] Classification: [13:Medium] [14:Medium] [N/A:N/A] Exudate A mount: [13:Serosanguineous] [14:Serous] [N/A:N/A] Exudate Type: [13:red, brown] [14:amber] [N/A:N/A] Exudate Color: [13:Distinct, outline attached] [14:Flat and Intact] [N/A:N/A] Wound Margin: [13:Large (67-100%)] [14:Medium (34-66%)] [N/A:N/A] Granulation A mount: [13:Red] [14:Red] [N/A:N/A] Granulation Quality: [13:Small (1-33%)] [14:Medium (34-66%)] [N/A:N/A] Necrotic A mount: [13:Fat Layer (Subcutaneous Tissue): Yes Fat Layer (Subcutaneous Tissue): Yes N/A] Exposed  Structures: [13:Fascia: No Tendon: No Muscle: No Joint: No Bone: No Small (1-33%)] [14:Fascia: No Tendon: No Muscle: No Joint: No Bone: No Small (1-33%)] [N/A:N/A] Epithelialization: [13:Cellular or Tissue Based Product] [14:Cellular or Tissue Based Product] [N/A:N/A] Treatment Notes Wound #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 Wound Cleanser Discharge Instruction: Cleanse the wound with wound cleanser or normal saline prior to applying a clean dressing using gauze sponges, not tissue or cotton balls. Peri-Wound Care Topical Primary Dressing Apligraf Secondary Dressing Woven Gauze Sponge, Non-Sterile 4x4 in Discharge Instruction: Apply over primary dressing as directed. ABD Pad, 5x9 Discharge Instruction: Apply over primary dressing as directed. ADAPTIC TOUCH 3x4.25 in Discharge Instruction: Apply over primary dressing and secured with steristrips Secured With Kerlix Roll Sterile, 4.5x3.1 (in/yd) Discharge Instruction: Secure with Kerlix as directed. Paper Tape, 2x10 (in/yd) Discharge Instruction: Secure dressing with tape as directed. Compression Wrap Compression Stockings Add-Ons Wound #14 (Calcaneus) Wound Laterality: Left, Posterior Cleanser Soap and Water Discharge Instruction: May shower and wash wound with dial antibacterial soap and water prior to dressing change. Wound Cleanser Discharge Instruction: Cleanse the wound with wound cleanser or normal saline prior to applying a clean dressing using gauze sponges, not tissue or cotton balls. Peri-Wound Care Topical Primary Dressing Apligraf Secondary Dressing Woven Gauze Sponge, Non-Sterile 4x4 in Discharge Instruction: Apply over primary dressing as directed. ALLEVYN Heel 4 1/2in x 5 1/2in / 10.5cm x 13.5cm Discharge Instruction: Apply over primary dressing  as directed. ADAPTIC TOUCH 3x4.25 in Discharge Instruction: Apply over primary dressing and secured with steristrips Secured With Kerlix Roll Sterile, 4.5x3.1 (in/yd) Discharge Instruction: Secure with Kerlix as directed. Paper Tape, 2x10 (in/yd) Discharge Instruction: Secure dressing with tape as directed. Compression Wrap Compression Stockings Add-Ons Electronic Signature(s) Signed: 08/03/2021 5:10:51 PM By: [name] MD Signed: 08/03/2021 5:26:52 PM By: Baruch Gouty RN, BSN Entered By: [name] on 08/03/2021 09:32:09 -------------------------------------------------------------------------------- Multi-Disciplinary Care Plan Details Patient Name: Date of Service: [name], [name] 08/03/2021 8:30 A M Medical Record Number: [id number] Patient Account Number: [id number] Date of Birth/Sex: Treating RN: 21-Sep-1941 (80 y.o. [name], [name] Primary Care Rashied Corallo: [name] Other Clinician: Referring Adryana Mogensen: Treating Hibah Odonnell/[name]: Tressie Stalker in Treatment: 7 Multidisciplinary Care Plan reviewed with physician Active Inactive Nutrition Nursing Diagnoses: Impaired glucose control: actual or potential Potential for alteratiion in Nutrition/Potential for imbalanced nutrition Goals: Patient/caregiver agrees to and verbalizes understanding of need to use nutritional supplements and/or vitamins as prescribed Date Initiated: 06/15/2021 Target Resolution Date: 08/18/2021 Goal Status: Active Patient/caregiver will maintain therapeutic glucose control Date Initiated: 06/15/2021 Target Resolution Date: 08/10/2021 Goal Status: Active Interventions: Assess HgA1c  results as ordered upon admission and as needed Assess patient nutrition upon admission and as needed per policy Provide education on elevated blood sugars and impact on wound healing Provide education on nutrition Treatment Activities: Education provided on  Nutrition : 06/15/2021 Notes: Wound/Skin Impairment Nursing Diagnoses: Impaired tissue integrity Knowledge deficit related to ulceration/compromised skin integrity Goals: Patient/caregiver will verbalize understanding of skin care regimen Date Initiated: 06/15/2021 Target Resolution Date: 08/10/2021 Goal Status: Active Interventions: Assess patient/caregiver ability to obtain necessary supplies Assess patient/caregiver ability to perform ulcer/skin care regimen upon admission and as needed Assess ulceration(s) every visit Provide education on ulcer and skin care Notes: Electronic Signature(s) Signed: 08/03/2021 5:16:08 PM By: Deon Pilling RN, BSN Entered By: Deon Pilling on 08/03/2021 09:00:52 -------------------------------------------------------------------------------- Pain Assessment Details Patient Name: Date of Service: [name], [place]. 08/03/2021 8:30 A M Medical Record Number: [id number] Patient Account Number: [id number] Date of Birth/Sex: Treating RN: 1941-01-19 (80 y.o. [name] Primary Care Lilyanna Lunt: [name] Other Clinician: Referring Adaly Puder: Treating Dyanna Seiter/[name]: Tressie Stalker in Treatment: 7 Active Problems Location of Pain Severity and Description of Pain Patient Has Paino No Site Locations Pain Management and Medication Current Pain Management: Electronic Signature(s) Signed: 08/03/2021 5:16:08 PM By: Deon Pilling RN, BSN Entered By: Deon Pilling on 08/03/2021 08:48:28 -------------------------------------------------------------------------------- Patient/Caregiver Education Details Patient Name: Date of Service: [name], [name] 11/10/2022andnbsp8:30 A M Medical Record Number: [id number] Patient Account Number: [id number] Date of Birth/Gender: Treating RN: October 04, 1940 (80 y.o. [name] Primary Care Physician: [name] Other Clinician: Referring Physician: Treating  Physician/[name]: Tressie Stalker in Treatment: 7 Education Assessment Education Provided To: Patient Education Topics Provided Elevated Blood Sugar/ Impact on Healing: Methods: Explain/Verbal Responses: Reinforcements needed, State content correctly Wound/Skin Impairment: Methods: Explain/Verbal Responses: Reinforcements needed, State content correctly Electronic Signature(s) Signed: 08/03/2021 5:16:08 PM By: Deon Pilling RN, BSN Entered By: Deon Pilling on 08/03/2021 09:01:13 -------------------------------------------------------------------------------- Wound Assessment Details Patient Name: Date of Service: [name], [place]. 08/03/2021 8:30 A M Medical Record Number: [id number] Patient Account Number: [id number] Date of Birth/Sex: Treating RN: 1941-03-25 (80 y.o. 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 Secondary Open Surgical Wound Wounding Event: Surgical Injury Etiology: Date Acquired: 05/11/2021 Wound Open Weeks Of Treatment: 7 Status: Clustered Wound: No Comorbid Coronary Artery Disease, Deep Vein Thrombosis, Hypertension, History: Myocardial Infarction, Peripheral Arterial Disease, Type II Diabetes Photos Wound Measurements Length: (cm) 4.5 Width: (cm) 5.2 Depth: (cm) 0.5 Area: (cm) 18.378 Volume: (cm) 9.189 % Reduction in Area: 52% % Reduction in Volume: 73.3% Epithelialization: Small (1-33%) Tunneling: No Undermining: No Wound Description Classification: Grade 2 Wound Margin: Distinct, outline attached Exudate Amount: Medium Exudate Type: Serosanguineous Exudate Color: red, brown Foul Odor After Cleansing: No Slough/Fibrino  Yes Wound Bed Granulation Amount: Large (67-100%) Exposed Structure Granulation Quality: Red Fascia Exposed: No Necrotic Amount: Small (1-33%) Fat Layer (Subcutaneous Tissue) Exposed: Yes Necrotic Quality: Adherent Slough Tendon Exposed: No Muscle Exposed: No Joint Exposed: No Bone Exposed: No Treatment Notes Wound #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 Wound Cleanser Discharge Instruction: Cleanse the wound with wound cleanser or normal saline prior to applying a clean dressing  using gauze sponges, not tissue or cotton balls. Peri-Wound Care Topical Primary Dressing Apligraf Secondary Dressing Woven Gauze Sponge, Non-Sterile 4x4 in Discharge Instruction: Apply over primary dressing as directed. ABD Pad, 5x9 Discharge Instruction: Apply over primary dressing as directed. ADAPTIC TOUCH 3x4.25 in Discharge Instruction: Apply over primary dressing and secured with steristrips Secured With Kerlix Roll Sterile, 4.5x3.1 (in/yd) Discharge Instruction: Secure with Kerlix as directed. Paper Tape, 2x10 (in/yd) Discharge Instruction: Secure dressing with tape as directed. Compression Wrap Compression Stockings Add-Ons Electronic Signature(s) Signed: 08/03/2021 5:16:08 PM By: Deon Pilling RN, BSN Entered By: Deon Pilling on 08/03/2021 09:00:21 -------------------------------------------------------------------------------- Wound Assessment Details Patient Name: Date of Service: [name], [name] 08/03/2021 8:30 A M Medical Record Number: [id number] Patient Account Number: [id number] Date of Birth/Sex: Treating RN: June 28, 1941 (80 y.o. [name], [name] Primary Care Alfie Rideaux: [name] Other Clinician: Referring Kambri Dismore: Treating Loa Idler/[name]: Tressie Stalker in Treatment:  7 Wound Status Wound Number: 14 Primary Diabetic Wound/Ulcer of the Lower Extremity Etiology: Wound Location: Left, Posterior Calcaneus Wound Open Wounding Event: Pressure Injury Status: Date Acquired: 05/11/2021 Comorbid Coronary Artery Disease, Deep Vein Thrombosis, Hypertension, Weeks Of Treatment: 7 History: Myocardial Infarction, Peripheral Arterial Disease, Type II Diabetes Clustered Wound: No Photos Wound Measurements Length: (cm) 0.8 Width: (cm) 1.2 Depth: (cm) 0.1 Area: (cm) 0.754 Volume: (cm) 0.075 % Reduction in Area: 45.4% % Reduction in Volume: 45.7% Epithelialization: Small (1-33%) Tunneling: No Undermining: No Wound Description Classification: Grade 1 Wound Margin: Flat and Intact Exudate Amount: Medium Exudate Type: Serous Exudate Color: amber Foul Odor After Cleansing: No Slough/Fibrino Yes Wound Bed Granulation Amount: Medium (34-66%) Exposed Structure Granulation Quality: Red Fascia Exposed: No Necrotic Amount: Medium (34-66%) Fat Layer (Subcutaneous Tissue) Exposed: Yes Necrotic Quality: Adherent Slough Tendon Exposed: No Muscle Exposed: No Joint Exposed: No Bone Exposed: No Treatment Notes Wound #14 (Calcaneus) Wound Laterality: Left, Posterior Cleanser Soap and Water Discharge Instruction: May shower and wash wound with dial antibacterial soap and water prior to dressing change. Wound Cleanser Discharge Instruction: Cleanse the wound with wound cleanser or normal saline prior to applying a clean dressing using gauze sponges, not tissue or cotton balls. Peri-Wound Care Topical Primary Dressing Apligraf Secondary Dressing Woven Gauze Sponge, Non-Sterile 4x4 in Discharge Instruction: Apply over primary dressing as directed. ALLEVYN Heel 4 1/2in x 5 1/2in / 10.5cm x 13.5cm Discharge Instruction: Apply over primary dressing as directed. ADAPTIC TOUCH 3x4.25 in Discharge Instruction: Apply over primary dressing and secured with  steristrips Secured With Kerlix Roll Sterile, 4.5x3.1 (in/yd) Discharge Instruction: Secure with Kerlix as directed. Paper Tape, 2x10 (in/yd) Discharge Instruction: Secure dressing with tape as directed. Compression Wrap Compression Stockings Add-Ons Electronic Signature(s) Signed: 08/03/2021 5:16:08 PM By: Deon Pilling RN, BSN Entered By: Deon Pilling on 08/03/2021 08:58:44 -------------------------------------------------------------------------------- Vitals Details Patient Name: Date of Service: [name], [place]. 08/03/2021 8:30 A M Medical Record Number: [id number] Patient Account Number: [id number] Date of Birth/Sex: Treating RN: 11/08/1940 (80 y.o. [name], [name] Primary Care Kyana Aicher: [name] Other Clinician: Referring Jamille Fisher: Treating Shaneya Taketa/[name]: Tressie Stalker in Treatment: 7 Vital Signs Time Taken: 08:40 Temperature (F): 97.7 Height (in): 64 Pulse (bpm): 79 Weight (lbs): 135 Respiratory Rate (breaths/min): 16 Body Mass Index (BMI): 23.2 Blood Pressure (mmHg): 154/79 Capillary Blood Glucose (mg/dl): 95 Reference Range: 80 - 120 mg / dl Electronic Signature(s) Signed: 08/03/2021 5:16:08 PM By: Deon Pilling RN, BSN Entered By: Deon Pilling on 08/03/2021 08:48:23

## 2021-08-07 DIAGNOSIS — Z4781 Encounter for orthopedic aftercare following surgical amputation: Secondary | ICD-10-CM | POA: Diagnosis not present

## 2021-08-07 DIAGNOSIS — L89620 Pressure ulcer of left heel, unstageable: Secondary | ICD-10-CM | POA: Diagnosis not present

## 2021-08-07 DIAGNOSIS — Z4801 Encounter for change or removal of surgical wound dressing: Secondary | ICD-10-CM | POA: Diagnosis not present

## 2021-08-07 DIAGNOSIS — I70202 Unspecified atherosclerosis of native arteries of extremities, left leg: Secondary | ICD-10-CM | POA: Diagnosis not present

## 2021-08-07 DIAGNOSIS — D638 Anemia in other chronic diseases classified elsewhere: Secondary | ICD-10-CM | POA: Diagnosis not present

## 2021-08-07 DIAGNOSIS — M86172 Other acute osteomyelitis, left ankle and foot: Secondary | ICD-10-CM | POA: Diagnosis not present

## 2021-08-09 DIAGNOSIS — I70202 Unspecified atherosclerosis of native arteries of extremities, left leg: Secondary | ICD-10-CM | POA: Diagnosis not present

## 2021-08-09 DIAGNOSIS — D638 Anemia in other chronic diseases classified elsewhere: Secondary | ICD-10-CM | POA: Diagnosis not present

## 2021-08-09 DIAGNOSIS — Z4801 Encounter for change or removal of surgical wound dressing: Secondary | ICD-10-CM | POA: Diagnosis not present

## 2021-08-09 DIAGNOSIS — M86172 Other acute osteomyelitis, left ankle and foot: Secondary | ICD-10-CM | POA: Diagnosis not present

## 2021-08-09 DIAGNOSIS — Z4781 Encounter for orthopedic aftercare following surgical amputation: Secondary | ICD-10-CM | POA: Diagnosis not present

## 2021-08-09 DIAGNOSIS — L89620 Pressure ulcer of left heel, unstageable: Secondary | ICD-10-CM | POA: Diagnosis not present

## 2021-08-11 ENCOUNTER — Other Ambulatory Visit: Payer: Self-pay | Admitting: Cardiovascular Disease

## 2021-08-11 ENCOUNTER — Other Ambulatory Visit: Payer: Self-pay | Admitting: Endocrinology

## 2021-08-11 ENCOUNTER — Other Ambulatory Visit: Payer: Self-pay | Admitting: Vascular Surgery

## 2021-08-11 DIAGNOSIS — L89620 Pressure ulcer of left heel, unstageable: Secondary | ICD-10-CM | POA: Diagnosis not present

## 2021-08-11 DIAGNOSIS — D638 Anemia in other chronic diseases classified elsewhere: Secondary | ICD-10-CM | POA: Diagnosis not present

## 2021-08-11 DIAGNOSIS — I70202 Unspecified atherosclerosis of native arteries of extremities, left leg: Secondary | ICD-10-CM | POA: Diagnosis not present

## 2021-08-11 DIAGNOSIS — Z4801 Encounter for change or removal of surgical wound dressing: Secondary | ICD-10-CM | POA: Diagnosis not present

## 2021-08-11 DIAGNOSIS — M86172 Other acute osteomyelitis, left ankle and foot: Secondary | ICD-10-CM | POA: Diagnosis not present

## 2021-08-11 DIAGNOSIS — Z4781 Encounter for orthopedic aftercare following surgical amputation: Secondary | ICD-10-CM | POA: Diagnosis not present

## 2021-08-14 DIAGNOSIS — I10 Essential (primary) hypertension: Secondary | ICD-10-CM | POA: Diagnosis not present

## 2021-08-14 DIAGNOSIS — U071 COVID-19: Secondary | ICD-10-CM | POA: Diagnosis not present

## 2021-08-14 DIAGNOSIS — R262 Difficulty in walking, not elsewhere classified: Secondary | ICD-10-CM | POA: Diagnosis not present

## 2021-08-14 DIAGNOSIS — L03116 Cellulitis of left lower limb: Secondary | ICD-10-CM | POA: Diagnosis not present

## 2021-08-14 DIAGNOSIS — Z4781 Encounter for orthopedic aftercare following surgical amputation: Secondary | ICD-10-CM | POA: Diagnosis not present

## 2021-08-14 DIAGNOSIS — Z4801 Encounter for change or removal of surgical wound dressing: Secondary | ICD-10-CM | POA: Diagnosis not present

## 2021-08-14 DIAGNOSIS — E785 Hyperlipidemia, unspecified: Secondary | ICD-10-CM | POA: Diagnosis not present

## 2021-08-14 DIAGNOSIS — L89620 Pressure ulcer of left heel, unstageable: Secondary | ICD-10-CM | POA: Diagnosis not present

## 2021-08-14 DIAGNOSIS — Z7982 Long term (current) use of aspirin: Secondary | ICD-10-CM | POA: Diagnosis not present

## 2021-08-14 DIAGNOSIS — E11628 Type 2 diabetes mellitus with other skin complications: Secondary | ICD-10-CM | POA: Diagnosis not present

## 2021-08-14 DIAGNOSIS — M6281 Muscle weakness (generalized): Secondary | ICD-10-CM | POA: Diagnosis not present

## 2021-08-14 DIAGNOSIS — H409 Unspecified glaucoma: Secondary | ICD-10-CM | POA: Diagnosis not present

## 2021-08-14 DIAGNOSIS — Z7901 Long term (current) use of anticoagulants: Secondary | ICD-10-CM | POA: Diagnosis not present

## 2021-08-14 DIAGNOSIS — D638 Anemia in other chronic diseases classified elsewhere: Secondary | ICD-10-CM | POA: Diagnosis not present

## 2021-08-14 DIAGNOSIS — Z7984 Long term (current) use of oral hypoglycemic drugs: Secondary | ICD-10-CM | POA: Diagnosis not present

## 2021-08-14 DIAGNOSIS — Z89412 Acquired absence of left great toe: Secondary | ICD-10-CM | POA: Diagnosis not present

## 2021-08-14 DIAGNOSIS — I70202 Unspecified atherosclerosis of native arteries of extremities, left leg: Secondary | ICD-10-CM | POA: Diagnosis not present

## 2021-08-14 DIAGNOSIS — M86172 Other acute osteomyelitis, left ankle and foot: Secondary | ICD-10-CM | POA: Diagnosis not present

## 2021-08-14 DIAGNOSIS — Z89422 Acquired absence of other left toe(s): Secondary | ICD-10-CM | POA: Diagnosis not present

## 2021-08-16 ENCOUNTER — Other Ambulatory Visit: Payer: Self-pay

## 2021-08-16 ENCOUNTER — Encounter (HOSPITAL_BASED_OUTPATIENT_CLINIC_OR_DEPARTMENT_OTHER): Payer: Medicare Other | Admitting: Physician Assistant

## 2021-08-16 DIAGNOSIS — E1152 Type 2 diabetes mellitus with diabetic peripheral angiopathy with gangrene: Secondary | ICD-10-CM | POA: Diagnosis not present

## 2021-08-16 DIAGNOSIS — M869 Osteomyelitis, unspecified: Secondary | ICD-10-CM | POA: Diagnosis not present

## 2021-08-16 DIAGNOSIS — L97428 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: Secondary | ICD-10-CM | POA: Diagnosis not present

## 2021-08-16 DIAGNOSIS — E11621 Type 2 diabetes mellitus with foot ulcer: Secondary | ICD-10-CM | POA: Diagnosis not present

## 2021-08-16 DIAGNOSIS — E1142 Type 2 diabetes mellitus with diabetic polyneuropathy: Secondary | ICD-10-CM | POA: Diagnosis not present

## 2021-08-16 DIAGNOSIS — L97522 Non-pressure chronic ulcer of other part of left foot with fat layer exposed: Secondary | ICD-10-CM | POA: Diagnosis not present

## 2021-08-16 DIAGNOSIS — E11319 Type 2 diabetes mellitus with unspecified diabetic retinopathy without macular edema: Secondary | ICD-10-CM | POA: Diagnosis not present

## 2021-08-16 DIAGNOSIS — L97422 Non-pressure chronic ulcer of left heel and midfoot with fat layer exposed: Secondary | ICD-10-CM | POA: Diagnosis not present

## 2021-08-16 NOTE — Progress Notes (Signed)
DERYA, DETTMANN ([id number]) Visit Report for 08/16/2021 Arrival Information Details Patient Name: Date of Service: [name] 08/16/2021 8:30 A M Medical Record Number: [id number] Patient Account Number: [id number] Date of Birth/Sex: Treating RN: June 13, 1941 (80 y.o. [name], [name] Primary Care Philo Kurtz: [name] Other Clinician: Referring Tanaja Ganger: Treating Aden Youngman/[name]: Waynette Buttery in Treatment: 8 Visit Information History Since Last Visit Added or deleted any medications: No Patient Arrived: [name] Any new allergies or adverse reactions: No Arrival Time: 08:39 Had a fall or experienced change in No Accompanied By: self activities of daily living that may affect Transfer Assistance: None risk of falls: Patient Identification Verified: Yes Signs or symptoms of abuse/neglect since last visito No Secondary Verification Process Completed: Yes Hospitalized since last visit: No Patient Requires Transmission-Based Precautions: No Implantable device outside of the clinic excluding No Patient Has Alerts: Yes cellular tissue based products placed in the center Patient Alerts: Patient on Blood Thinner since last visit: L ABI: 0.82 Has Dressing in Place as Prescribed: Yes R ABI: 0.47 Pain Present Now: No Electronic Signature(s) Signed: 08/16/2021 3:00:15 PM By: Rhae Hammock RN Entered By: Rhae Hammock on 08/16/2021 08:39:47 -------------------------------------------------------------------------------- Encounter Discharge Information Details Patient Name: Date of Service: [name], [name] 08/16/2021 8:30 A M Medical Record Number: [id number] Patient Account Number: [id number] Date of Birth/Sex: Treating RN: [birth year] (80 y.o. [name], [name] Primary Care Carianna Lague: [name] Other Clinician: Referring Demitris Pokorny: Treating Kanisha Duba/[name]: Waynette Buttery in Treatment:  8 Encounter Discharge Information Items Post Procedure Vitals Discharge Condition: Stable Temperature (F): 97.4 Ambulatory Status: Ambulatory Pulse (bpm): 74 Discharge Destination: Home Respiratory Rate (breaths/min): 17 Transportation: Private Auto Blood Pressure (mmHg): 120/74 Accompanied By: self Schedule Follow-up Appointment: Yes Clinical Summary of Care: Patient Declined Electronic Signature(s) Signed: 08/16/2021 3:00:15 PM By: Rhae Hammock RN Entered By: Rhae Hammock on 08/16/2021 09:32:50 -------------------------------------------------------------------------------- Lower Extremity Assessment Details Patient Name: Date of Service: [name], [name] 08/16/2021 8:30 A M Medical Record Number: [id number] Patient Account Number: [id number] Date of Birth/Sex: Treating RN: Nov 24, 1940 (80 y.o. [name], [name] Primary Care Mussa Groesbeck: [name] Other Clinician: Referring Marqus Macphee: Treating Samella Lucchetti/[name]: Waynette Buttery in Treatment: 8 Edema Assessment Assessed: [Left: Yes] [Right: No] Edema: [Left: N] [Right: o] Calf Left: Right: Point of Measurement: From Medial Instep 33.5 cm Ankle Left: Right: Point of Measurement: From Medial Instep 20 cm Vascular Assessment Pulses: Dorsalis Pedis Palpable: [Left:Yes] Posterior Tibial Palpable: [Left:Yes] Electronic Signature(s) Signed: 08/16/2021 3:00:15 PM By: Rhae Hammock RN Entered By: Rhae Hammock on 08/16/2021 08:47:10 -------------------------------------------------------------------------------- Multi-Disciplinary Care Plan Details Patient Name: Date of Service: [name], [name]. 08/16/2021 8:30 A M Medical Record Number: [id number] Patient Account Number: [id number] Date of Birth/Sex: Treating RN: 10/09/1940 (80 y.o. [name], [name] Primary Care Yeraldy Spike: [name] Other Clinician: Referring Norwood Quezada: Treating Lya Holben/[name]:  Waynette Buttery in Treatment: [place] reviewed with physician Active Inactive Nutrition Nursing Diagnoses: Impaired glucose control: actual or potential Potential for alteratiion in Nutrition/Potential for imbalanced nutrition Goals: Patient/caregiver agrees to and verbalizes understanding of need to use nutritional supplements and/or vitamins as prescribed Date Initiated: 06/15/2021 Target Resolution Date: 08/18/2021 Goal Status: Active Patient/caregiver will maintain therapeutic glucose control Date Initiated: 06/15/2021 Target Resolution Date: 08/18/2021 Goal Status: Active Interventions: Assess HgA1c results as ordered upon admission and as needed Assess patient nutrition upon admission and as needed per policy Provide education on elevated blood sugars  and impact on wound healing Provide education on nutrition Treatment Activities: Education provided on Nutrition : 06/15/2021 Notes: Wound/Skin Impairment Nursing Diagnoses: Impaired tissue integrity Knowledge deficit related to ulceration/compromised skin integrity Goals: Patient/caregiver will verbalize understanding of skin care regimen Date Initiated: 06/15/2021 Target Resolution Date: 08/18/2021 Goal Status: Active Interventions: Assess patient/caregiver ability to obtain necessary supplies Assess patient/caregiver ability to perform ulcer/skin care regimen upon admission and as needed Assess ulceration(s) every visit Provide education on ulcer and skin care Notes: Electronic Signature(s) Signed: 08/16/2021 3:00:15 PM By: Rhae Hammock RN Entered By: Rhae Hammock on 08/16/2021 09:22:48 -------------------------------------------------------------------------------- Pain Assessment Details Patient Name: Date of Service: [name], [place]. 08/16/2021 8:30 A M Medical Record Number: [id number] Patient Account Number: [id number] Date of Birth/Sex: Treating  RN: October 19, 1940 (80 y.o. [name], [name] Primary Care Pheonix Clinkscale: [name] Other Clinician: Referring Lois Ostrom: Treating Mathan Darroch/[name]: Waynette Buttery in Treatment: 8 Active Problems Location of Pain Severity and Description of Pain Patient Has Paino No Site Locations Pain Management and Medication Current Pain Management: Electronic Signature(s) Signed: 08/16/2021 3:00:15 PM By: Rhae Hammock RN Entered By: Rhae Hammock on 08/16/2021 08:44:49 -------------------------------------------------------------------------------- Patient/Caregiver Education Details Patient Name: Date of Service: [name], [name] 11/23/2022andnbsp8:30 [place] Record Number: [id number] Patient Account Number: [id number] Date of Birth/Gender: Treating RN: 01-Jul-1941 (80 y.o. [name], [name] Primary Care Physician: [name] Other Clinician: Referring Physician: Treating Physician/[name]: Waynette Buttery in Treatment: 8 Education Assessment Education Provided To: Patient Education Topics Provided Wound/Skin Impairment: Methods: Explain/Verbal Responses: State content correctly Electronic Signature(s) Signed: 08/16/2021 3:00:15 PM By: Rhae Hammock RN Entered By: Rhae Hammock on 08/16/2021 09:23:27 -------------------------------------------------------------------------------- Wound Assessment Details Patient Name: Date of Service: [name], [place]. 08/16/2021 8:30 A M Medical Record Number: [id number] Patient Account Number: [id number] Date of Birth/Sex: Treating RN: 08/06/1941 (80 y.o. 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 Secondary Open Surgical Wound Wounding Event: Surgical Injury Etiology: Date Acquired: 05/11/2021 Wound Open Weeks Of Treatment: 8 Status: Clustered Wound: No Comorbid Coronary Artery Disease, Deep Vein Thrombosis, Hypertension, History: Myocardial Infarction, Peripheral Arterial Disease, Type II Diabetes Photos Wound Measurements Length: (cm) 4.3 Width: (cm) 3.9 Depth: (cm) 0.5 Area: (cm) 13.171 Volume: (cm) 6.586 % Reduction in Area: 65.6% % Reduction in Volume: 80.9% Epithelialization: Small (1-33%) Tunneling: No Undermining: No Wound Description Classification: Grade 2 Wound Margin: Distinct, outline attached Exudate Amount: Medium Exudate Type: Serosanguineous Exudate Color: red, brown Foul Odor After Cleansing: No Slough/Fibrino Yes Wound Bed Granulation Amount: Large (67-100%) Exposed Structure Granulation Quality: Red Fascia Exposed: No Necrotic Amount: Small (1-33%) Fat Layer (Subcutaneous Tissue) Exposed: Yes Necrotic Quality: Adherent Slough Tendon Exposed: No Muscle Exposed: No Joint Exposed: No Bone Exposed: No Treatment Notes Wound #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 Wound Cleanser Discharge Instruction: Cleanse the wound with wound cleanser or normal saline prior to applying a clean dressing using gauze sponges, not tissue or cotton balls. Peri-Wound Care Topical Primary Dressing Apligraf Discharge Instruction: 3 Secondary Dressing Woven Gauze Sponge, Non-Sterile 4x4 in Discharge Instruction: Apply over primary dressing as directed. ABD Pad, 5x9 Discharge Instruction: Apply over primary  dressing as directed. ADAPTIC TOUCH 3x4.25 in Discharge Instruction: Apply over primary dressing and secured with steristrips Secured With Kerlix Roll  Sterile, 4.5x3.1 (in/yd) Discharge Instruction: Secure with Kerlix as directed. Paper Tape, 2x10 (in/yd) Discharge Instruction: Secure dressing with tape as directed. Compression Wrap Compression Stockings Add-Ons Electronic Signature(s) Signed: 08/16/2021 3:00:15 PM By: Rhae Hammock RN Entered By: Rhae Hammock on 08/16/2021 08:56:32 -------------------------------------------------------------------------------- Wound Assessment Details Patient Name: Date of Service: [name], [name] Calamity LLY H. 08/16/2021 8:30 A M Medical Record Number: [id number] Patient Account Number: [id number] Date of Birth/Sex: Treating RN: 1941-02-05 (80 y.o. [name], [name] Primary Care Spencer Cardinal: [name] Other Clinician: Referring Alberto Pina: Treating Keontay Vora/[name]: Waynette Buttery in Treatment: 8 Wound Status Wound Number: 14 Primary Diabetic Wound/Ulcer of the Lower Extremity Etiology: Wound Location: Left, Posterior Calcaneus Wound Open Wounding Event: Pressure Injury Status: Date Acquired: 05/11/2021 Comorbid Coronary Artery Disease, Deep Vein Thrombosis, Hypertension, Weeks Of Treatment: 8 History: Myocardial Infarction, Peripheral Arterial Disease, Type II Diabetes Clustered Wound: No Photos Wound Measurements Length: (cm) 0.6 Width: (cm) 1.4 Depth: (cm) 0.2 Area: (cm) 0.66 Volume: (cm) 0.132 % Reduction in Area: 52.2% % Reduction in Volume: 4.3% Epithelialization: Small (1-33%) Tunneling: No Undermining: No Wound Description Classification: Grade 2 Wound Margin: Flat and Intact Exudate Amount: Medium Exudate Type: Serous Exudate Color: amber Foul Odor After Cleansing: No Slough/Fibrino Yes Wound Bed Granulation Amount: Medium (34-66%) Exposed Structure Granulation Quality: Red Fascia Exposed: No Necrotic Amount: Medium (34-66%) Fat Layer (Subcutaneous Tissue) Exposed: Yes Necrotic Quality: Adherent Slough Tendon Exposed:  No Muscle Exposed: No Joint Exposed: No Bone Exposed: No Treatment Notes Wound #14 (Calcaneus) Wound Laterality: Left, Posterior Cleanser Soap and Water Discharge Instruction: May shower and wash wound with dial antibacterial soap and water prior to dressing change. Wound Cleanser Discharge Instruction: Cleanse the wound with wound cleanser or normal saline prior to applying a clean dressing using gauze sponges, not tissue or cotton balls. Peri-Wound Care Topical Primary Dressing Apligraf Discharge Instruction: 3 Secondary Dressing Woven Gauze Sponge, Non-Sterile 4x4 in Discharge Instruction: Apply over primary dressing as directed. Zetuvit Plus 4x8 in Discharge Instruction: Add zetuvit to heel for extra protection from pt. rubbing heel againts back of her shoe. Apply over primary dressing as directed. ADAPTIC TOUCH 3x4.25 in Discharge Instruction: Apply over primary dressing and secured with steristrips Secured With Kerlix Roll Sterile, 4.5x3.1 (in/yd) Discharge Instruction: Secure with Kerlix as directed. Paper Tape, 2x10 (in/yd) Discharge Instruction: Secure dressing with tape as directed. Compression Wrap Compression Stockings Add-Ons Electronic Signature(s) Signed: 08/16/2021 3:00:15 PM By: Rhae Hammock RN Entered By: Rhae Hammock on 08/16/2021 09:00:27 -------------------------------------------------------------------------------- Vitals Details Patient Name: Date of Service: [name], [place]. 08/16/2021 8:30 A M Medical Record Number: [id number] Patient Account Number: [id number] Date of Birth/Sex: Treating RN: 1941/04/10 (80 y.o. [name], [name] Primary Care Anelisse Jacobson: [name] Other Clinician: Referring [name]: Treating Dejohn Ibarra/[name]: Waynette Buttery in Treatment: 8 Vital Signs Time Taken: 08:40 Temperature (F): 97.4 Height (in): 64 Pulse (bpm): 89 Weight (lbs): 135 Respiratory Rate  (breaths/min): 17 Body Mass Index (BMI): 23.2 Blood Pressure (mmHg): 160/74 Capillary Blood Glucose (mg/dl): 106 Reference Range: 80 - 120 mg / dl Electronic Signature(s) Signed: 08/16/2021 3:00:15 PM By: Rhae Hammock RN Entered By: Rhae Hammock on 08/16/2021 08:44:43

## 2021-08-16 NOTE — Progress Notes (Addendum)
[name], [name] ([id number]) Visit Report for 08/16/2021 Chief Complaint Document Details Patient Name: Date of Service: [name] 08/16/2021 8:30 A M Medical Record Number: [id number] Patient Account Number: [id number] Date of Birth/Sex: Treating RN: [birth year] (80 y.o. [name] Primary Care Provider: [name] Other Clinician: Referring Provider: Treating Provider/[name]: Waynette Buttery in Treatment: 8 Information Obtained from: Patient Chief Complaint 07/13/2019; patient is here for review wounds on the toes of her right foot and the left fourth toe 08/04/2020; patient is here for review of wounds at the anterior part of her right first met head/TMA site and another area on the right medial calf at a vein harvest site. Electronic Signature(s) Signed: 08/16/2021 9:04:53 AM By: Worthy Keeler PA-C Entered By: Worthy Keeler on 08/16/2021 09:04:53 -------------------------------------------------------------------------------- Cellular or Tissue Based Product Details Patient Name: Date of Service: [name], [place]. 08/16/2021 8:30 A M Medical Record Number: [id number] Patient Account Number: [id number] Date of Birth/Sex: Treating RN: 02/14/1941 (80 y.o. [name] Primary Care Provider: [name] Other Clinician: Referring Provider: Treating Provider/[name]: Waynette Buttery in Treatment: 8 Cellular or Tissue Based Product Type Wound #13 Left Amputation Site - Transmetatarsal Applied to: Performed By: Physician Worthy Keeler, PA Cellular or Tissue Based Product Type: Apligraf Level of Consciousness (Pre-procedure): Awake and Alert Pre-procedure Verification/Time Out Yes - 09:19 Taken: Location: genitalia / hands / feet / multiple digits Wound Size (sq cm): 16.77 Product Size (sq cm): 44 Waste Size (sq cm): 0 Amount of Product Applied (sq cm): 44 Instrument Used: Blade,  Forceps, Scissors Lot #: [id number] Expiration Date: 08/24/2021 Fenestrated: Yes Instrument: Blade Reconstituted: Yes Solution Type: NS Solution Amount: 10 ML Lot #: [id number] Solution Expiration Date: 05/25/2022 Secured: Yes Secured With: Steri-Strips Dressing Applied: Yes Primary Dressing: adaptic Procedural Pain: 0 Post Procedural Pain: 0 Response to Treatment: Procedure was tolerated well Level of Consciousness (Post- Awake and Alert procedure): Post Procedure Diagnosis Same as Pre-procedure Electronic Signature(s) Signed: 08/16/2021 3:00:15 PM By: Rhae Hammock RN Signed: 08/16/2021 5:33:12 PM By: Worthy Keeler PA-C Entered By: Rhae Hammock on 08/16/2021 09:28:21 -------------------------------------------------------------------------------- Debridement Details Patient Name: Date of Service: [name], [name] 08/16/2021 8:30 A M Medical Record Number: [id number] Patient Account Number: [id number] Date of Birth/Sex: Treating RN: 29-Oct-1940 (80 y.o. [name] Primary Care Provider: [name] Other Clinician: Referring Provider: Treating Provider/[name]: Waynette Buttery in Treatment: 8 Debridement Performed for Assessment: Wound #13 Left Amputation Site - Transmetatarsal Performed By: Physician Worthy Keeler, PA Debridement Type: Debridement Severity of Tissue Pre Debridement: Fat layer exposed Level of Consciousness (Pre-procedure): Awake and Alert Pre-procedure Verification/Time Out Yes - 09:17 Taken: Start Time: 09:17 Pain Control: Lidocaine T Area Debrided (L x W): otal 4.3 (cm) x 3.9 (cm) = 16.77 (cm) Tissue and other material debrided: Viable, Non-Viable, Slough, Subcutaneous, Skin: Dermis , Skin: Epidermis, Biofilm, Slough Level: Skin/Subcutaneous Tissue Debridement Description: Excisional Instrument: Curette Bleeding: Minimum Hemostasis Achieved: Pressure End Time: 09:17 Procedural Pain:  0 Post Procedural Pain: 0 Response to Treatment: Procedure was tolerated well Level of Consciousness (Post- Awake and Alert procedure): Post Debridement Measurements of Total Wound Length: (cm) 4.3 Width: (cm) 3.9 Depth: (cm) 0.5 Volume: (cm) 6.586 Character of Wound/Ulcer Post Debridement: Improved Severity of Tissue Post Debridement: Fat layer exposed Post Procedure Diagnosis Same as Pre-procedure Electronic Signature(s) Signed: 08/16/2021 3:00:15 PM By: Rhae Hammock RN Signed: 08/16/2021 5:33:12 PM By:  Melburn Hake, Jazae Gandolfi PA-C Entered By: Rhae Hammock on 08/16/2021 09:25:30 -------------------------------------------------------------------------------- Debridement Details Patient Name: Date of Service: [name] 08/16/2021 8:30 A M Medical Record Number: [id number] Patient Account Number: [id number] Date of Birth/Sex: Treating RN: 1941-05-25 (80 y.o. [name] Primary Care Provider: [name] Other Clinician: Referring Provider: Treating Provider/[name]: Waynette Buttery in Treatment: 8 Debridement Performed for Assessment: Wound #14 Left,Posterior Calcaneus Performed By: Physician Worthy Keeler, PA Debridement Type: Debridement Severity of Tissue Pre Debridement: Fat layer exposed Level of Consciousness (Pre-procedure): Awake and Alert Pre-procedure Verification/Time Out Yes - 09:18 Taken: Start Time: 09:18 Pain Control: Lidocaine T Area Debrided (L x W): otal 0.6 (cm) x 1.4 (cm) = 0.84 (cm) Tissue and other material debrided: Viable, Non-Viable, Slough, Subcutaneous, Skin: Dermis , Skin: Epidermis, Biofilm, Slough Level: Skin/Subcutaneous Tissue Debridement Description: Excisional Instrument: Curette Bleeding: Minimum Hemostasis Achieved: Pressure End Time: 09:18 Procedural Pain: 0 Post Procedural Pain: 0 Response to Treatment: Procedure was tolerated well Level of Consciousness (Post- Awake  and Alert procedure): Post Debridement Measurements of Total Wound Length: (cm) 0.6 Width: (cm) 1.4 Depth: (cm) 0.2 Volume: (cm) 0.132 Character of Wound/Ulcer Post Debridement: Improved Severity of Tissue Post Debridement: Fat layer exposed Post Procedure Diagnosis Same as Pre-procedure Electronic Signature(s) Signed: 08/16/2021 3:00:15 PM By: Rhae Hammock RN Signed: 08/16/2021 5:33:12 PM By: Worthy Keeler PA-C Entered By: Rhae Hammock on 08/16/2021 09:26:19 -------------------------------------------------------------------------------- HPI Details Patient Name: Date of Service: [name], [name] 08/16/2021 8:30 A M Medical Record Number: [id number] Patient Account Number: [id number] Date of Birth/Sex: Treating RN: Feb 17, 1941 (80 y.o. [name] Primary Care Provider: [name] Other Clinician: Referring Provider: Treating Provider/[name]: Waynette Buttery in Treatment: 8 History of Present Illness HPI Description: ADMISSION 07/13/2019 Patient is a 80 year old type II diabetic. She has known PAD. She has been followed by Dr. Jacqualyn Posey of podiatry for blistering areas on her toes dating back to 04/30/2019 which at this was the left fourth toe. By 8/11 she had wounds on the right and left second toes. She underwent arterial studies by Dr. Alvester Chou on 7/31 that showed ABIs on the right of 0.60 TBI of 0.26 on the left ABI of 0.56 and a TBI of 0.25. By 9/10 she had ischemic-looking wounds per Dr. Earleen Newport on the right first, left first second and third. She has been using Medihoney and then mupirocin more recently simply Betadine. The patient underwent angiography by Dr. Gwenlyn Found on 9/21. 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 Noted that there was 1 vessel runoff in the foot via the peroneal. On the left there was an  80% left SFA, 70% mid left SFA. There was a short segment calcified CTO distal less than SFA and above-knee popliteal artery reconstituting with two-vessel runoff. 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 It was felt that she had bilateral SFA disease as well as tibial vessel disease. An attempt was made to revascularize the left SFA but they were unable to cross because of the highly calcified nature of the lesion. 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 She has an area on the medial left fourth toe with raised horned callus on top of this. I am  not certain what this represents. With regards to pain she has about a 2-1/2 I will claudication tolerance in the grocery store. She has some pain in night which is relieved by putting her feet down over the side of the bed. She is wearing Nitro-Dur patches on the top of her right foot. Past medical history includes type 2 diabetes with secondary PAD, neoplasm of the skin, diabetic retinopathy, carotid artery stenosis, hyperlipidemia hypertension. 11/2; the last time the patient was here I spoke to Dr. Gwenlyn Found about revascularization options on the right. As I understand things currently Dr. Gwenlyn Found spoke with Dr. Trula Slade and ultimately the patient was taken to surgery on 10/27. She had a right iliofemoral endarterectomy with a bovine patch angioplasty. I think the plan now is for her to have a staged intervention on the right SFA by Dr. Alvester Chou. Per the patient's understanding Dr. Gwenlyn Found and Dr. Jacqualyn Posey are waiting to see when the gangrenous toes on the right foot can be amputated. The patient states her pain is a lot better and she is grateful for that. 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 Small area on the left fourth toe. She is using Betadine to the mummified areas on the right and Medihoney  on the left 08/27/2019; the last time I saw this patient I discharged her from the clinic. She had been revascularized by Dr. Trula Slade and she had a right iliofemoral endarterectomy with a bovine angioplasty. 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 Jacqualyn Posey of podiatry on 08/07/2019. I note that she also had a intervention by Dr. Gwenlyn Found and he performed a directional atherectomy and drug-coated balloon angioplasty of the SFA and popliteal artery on the right. I am not certain of the date of Dr. Kennon Holter procedure as of the time of this dictation. She was referred back to [place] by Dr. Earleen Newport predominantly for follow-up of the left fourth toe. She still has sutures and stitches in the right TMA site. She states her pain is a lot better. She expresses concern about the condition of the amputation site at the TMA. She is on doxycycline I think prescribed by Dr. Earleen Newport. She is complaining of some pain at night 12/10; I spoke to Dr. Jacqualyn Posey last week. He removed the sutures on the right foot on Monday of this week. She has the area on the left fourth toe just proximal to the PIP and then the right TMA site. She is still on doxycycline and has enough through next week. Unfortunately the TMA site does not look good at all. Both on the lateral and medial part of the incisions are areas that probe to bone. There is purulence over the medial part which I have cultured. We will use silver alginate. Left fourth toe looks somewhat better but there was still exposed bone 12/17; patient has an MRI booked for 12/30. Culture I did last week showed Pseudomonas Serratia and Enterococcus. This was purulent drainage coming out of the medial part of her amputation site. I use cefdinir 300 twice daily for 10 days that started on 12/15. Her x-ray on the right showed limited evaluation for osteomyelitis. The findings could have been postoperative. There was subtle erosion in the distal  first and distal fifth metatarsal. An MRI was suggested. On the left she had irregularity of the fourth middle and proximal phalanx consistent with a history of osteomyelitis. I do not know that she has  a history of osteomyelitis in this area. She had a newly defined area on the plantar third toe 12/31; patient's MRI is listed below: MRI OF THE RIGHT FOREFOOT WITHOUT AND WITH CONTRAST TECHNIQUE: Multiplanar, multisequence MR imaging of the right forefoot was performed before and after the administration of intravenous contrast. CONTRAST 6 mL GADAVIST IV SOLN : COMPARISON: Plain films right foot 09/04/2019. FINDINGS: Bones/Joint/Cartilage The patient is status post transmetatarsal amputation as seen on the prior exam. Marrow edema and enhancement are seen in all of the distal metatarsals. In the first metatarsal, signal change extends 1.5 cm proximal to the stump and in the second metatarsal extends approximately 2 cm proximal to the stump. Edema and enhancement are seen in only the distal 0.7 cm of the third metatarsal stump and tips of the fourth and fifth metatarsals. Bone marrow signal is otherwise unremarkable. A small focus of subchondral edema is seen in the lateral talus, likely degenerative. Ligaments Intact. Muscles and Tendons No intramuscular fluid collection. Soft tissues Skin ulceration is seen off the stump of the first metatarsal. A thin fluid collection tracks deep to the wound and over the anterior metatarsals worrisome for small abscess. Intense subcutaneous edema and enhancement are seen diffusely. 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 Cellulitis about the foot. Skin ulceration over the distal first metatarsal is identified with a thin fluid collection tracking anteriorly along the stump worrisome for  abscess. Small focus of subchondral edema in the lateral talus is likely degenerative. Electronically Signed By: Inge Rise M.D. On: 09/23/2019 15:25 Patient arrives in clinic today not complaining any of any pain. 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 She does not describe claudication however her activity is very limited. 10/01/2019; since the patient was last here I have communicated with Dr. Gwenlyn Found after bypass by Dr. Trula Slade and addressing the right superficial femoral artery he states that she is widely patent through peroneal arteries to the ankle with collaterals to the dorsalis pedis. He states he is going to talk to colleagues about the feasibility of tibial pedal access. The patient seems infectious disease later this afternoon Dr. Baxter Flattery. in preparation for this she has been off antibiotics for 1 week and I went ahead and obtain pieces of the remanence of her first metatarsal for pathology and CandS. The patient is a candidate for hyperbaric oxygen. 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 1/15; considerable improvement in the wounds on her feet. We are using silver collagen. She follows with Dr. Baxter Flattery of infectious disease and is on meropenem and daptomycin. She has been taught how to do this herself at home. Follows with Dr. Graylon Good at the end of treatment here. She has 2 wounds on the surgical TMA site 1 lateral and 1 medial the lateral 1 has regressed tremendously. The area medially still has some exposed bone although the base of this looks healthy. 1/22; 2 separate wounds on the surgical TMA site. Both of these look satisfactory. The medial area does not have any exposed bone. This is an improvement On the left side fourth toe dorsally over the proximal phalanx there is a deep punched out area that probes to bone. She has an area on the tip of the  left third toe. She also tells [place] that in HBO she  traumatized her left Achilles and this is left her with a superficial wound area 1/28; weekly visit along with HBO. She has 5 wounds. T our punched out areas on the original TMA site on the right. Both of these appear to have contracted. o The area on the right no longer has exposed bone. She has an area on the tip of the left third toe and on the DIP of the left fourth toe. Both of these had surface callus that I removed and unfortunately they have small areas that both go to bone. She has a traumatic wound on the left Achilles area that happened in HBO and that appears better. She is tolerating her IV antibiotics well at home. She has home health changing the dressings and she is doing it once on the weekends. We have been using silver collagen. She has been extensively revascularized on the right by Dr. Trula Slade and subsequently by Dr. Gwenlyn Found. 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 Baxter Flattery of infectious disease on 2/17. Dr. Gwenlyn Found on 2/23 and according to her her antibiotics stopped on 2/23. She is tolerating hyperbarics well. She has made a tremendous improvement in the right forefoot with only 2 smaller open wounds. Using silver collagen. On the left foot she has the area on the tip of the left third toe and the medial part of the left fourth toe. These had exposed bone last week I did not sense any of that today 2/12; sees Dr. Graylon Good on 2/17 and Dr. Gwenlyn Found on 2/23. We are going to use Dermagraft on this today however the lateral part of her train TMA incision on the right is healed and the medial part is down so much that we just continue with the silver collagen She has wounds on the tip of her left third and the medial aspect of the left fourth. Both of these still have exposed bone I have not been able to get  these to epithelialize. 2/19; she sees Dr. Gwenlyn Found on 2/23 and I have communicated with him about the left vascular supply. Looking at her angiogram from 08/03/2019 it looks as though that they could not cross the left CFA. Noted that she had one-vessel runoff bilaterally. She also apparently saw Dr. Graylon Good although I did not look up these results for preparation of this record. 2/26; Dr. Gwenlyn Found is going to do an angiogram next week on Wednesday. I think they are going to try to go at this both retrograde and anterograde to see if anything can be done to revascularize the left lower limb. She continues to make nice progress on the remaining wound on the right medial foot on her TMA site and the toe it wounds on the left are responding nicely as well 3/12; the patient underwent an extensive and complicated revascularization on the left leg by Dr. Fletcher Anon on 3/3; she had an atherectomy on occluded left popliteal artery; anterior tibial artery followed by a balloon angioplasty. Atherectomy was also performed to the left SFA because there was still significant 50% stenosis in the left popliteal artery they performed an intravascular lithotripsy which improved the residual stenosis to 20%. The same lithotripsy was used to dilate the proximal left SFA. She had a drug-eluting stent placed I believe in the SFA. The patient returned for hyperbarics this week. She has had some eye problems  on the right which she tells me are secondary to diabetic retinopathy and she saw her eye doctor. She is really made excellent progress there is no open wounds on the left foot at all. She has one open area medially and her TMA site on the right lateral wound is closed. 3/19; the patient comes in today with the area on the medial transmet on the right improved. Some debris removed from the surface revealed the still open wound. Unfortunately she now has the open area on the third toe tip and the medial aspect of the dorsal fourth  toe. These are in the same location as her previous wounds. These had actually closed up last week. 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 She is undergone complex revascularizations on the right by Dr. Trula Slade and Dr. Gwenlyn Found and I believe Dr. 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 She completed IVantibiotics as directed by infectious disease and I believe a course of oral antibiotics as well.she underwent a complete 40 treatments of hyperbarics. She comes in today with the vast majority of the right TMA site healed there is a small opening on the most medial aspect. Her left third and left fourth toes appear to have epithelialization although this has fluctuated somewhat. 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 She had a gaping wound on the right transmetatarsal amputation L as well as deep wounds on the left third and left fourth toes. She underwent complex revascularizations on her bilateral lower legs which I described on her last visit. She completed IV antibiotics by infectious disease and 40 treatments of hyperbarics. Her left third and fourth toes are healed. The area on the right transmetatarsal amputation site is also closed today. READMISSION 08/04/2020 [name] is now a 80 year old woman that we had for a complex stay in our clinic from October 2000 14 January 2020. 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 Her current problems with wounds began on October 30 when she developed a spontaneous opening over the right transmet medial aspect  over the first metatarsal head. She has been using collagen on this that she had leftover from her last stay in this clinic. She also has had an open area on the medial right calf from a vein harvest site from her CABG she. She said that this is never really closed since the vein harvest was done. With regards to her arterial status this is a major issue. She had an angiogram by Dr. Gwenlyn Found in September 2020. She developed a gangrenous right first and second toes. Ultimately she was referred to Dr. Trula Slade who did a right common femoral endarterectomy with patch angioplasty. This was on 07/21/2019 and she really had a good result. Dr. Gwenlyn Found did a atherectomy followed by a drug coated balloon angioplasty of her right SFA popliteal and tibial peroneal trunk on 08/03/2019 again with excellent excellent results. Follow-up.Dopplers in November revealed revealed a widely patent SFA and tibioperoneal trunk. UNFORTUNATELY her recent angiogram that was done on 08/01/2020 showed an 80% right common femoral artery  stenosis just above the previous endarterectomy site occluded right right SFA at the origin reconstituting the adductor canal by the profunda femoris collaterals. The profunda femoris is also diffusely diseased. There is and again another 80% tibioperoneal trunk stenosis and one-vessel runoff via the peroneal artery. She is seen Dr. Donzetta Matters and she is scheduled for a femoral endarterectomy and right femoral-popliteal bypass grafting with endarterectomy of her tibial peroneal trunk. The scheduled surgery is on 11/23 The patient does not complain of pain at rest. She does have 5-minute walking claudication. The wounds on her right first met head remanent was spontaneous she does not think she hit this. As mentioned in the area on her right medial calf is a vein harvest site Dr. Gwenlyn Found had texted me about getting her in the clinic and we have arranged this as soon as we can. I have told her that we will have to  see how successful Dr. Donzetta Matters is before we know what can be done about the wounds in a precise fashion. Fortunately there does not seem to be any obvious infection involving the right foot although that may need to be looked at if things really deteriorate. She was treated with IV antibiotics and hyperbaric oxygen for her previous osteomyelitis 12/2; the patient underwent a right femoral endarterectomy and bypass femoral to popliteal artery. This was done on 08/16/2020. She is done remarkably well. She has a small area on the right anterior tibia and a small area in the medial part of her original right transmetatarsal amputation. She appears to have had an excellent response to surgery. 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 Tomassi is a now 80 year old woman who arrives for review of wounds on the left TMA site extending into her plantar foot as well as an area on the left posterior calcaneus. 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 She was extensively revascularized on the right at that time as noted above in our previous notes ended up receiving IV antibiotics and hyperbaric oxygen therapy. Miraculously this TMA site actually closed and she really has had not any trouble with this since. UNFORTUNATELY the same cannot be said of her left side. Her problem apparently started sometime in July when she hit her left third toe on a barstool while playing pool. She developed an acute infection which eventually led to underlying osteomyelitis. She underwent an extensive hospitalization from 04/27/2021 through 05/15/2021. She received IV antibiotics. Also extensive intervention by Dr. Stanford Breed of vein and vascular which included a left common femoral to tibioperoneal trunk bypass as  well as a left third toe amputation on 05/03/2021. Unfortunately the area did not heal she ended up requiring a left TMA. By her description this was left open. She has been using a wound VAC on this to earlier this week and then was switched to Santyl. I think because of the left forefoot off loader she is also developed an area on the left posterior calcaneus. She is not complaining of a lot of pain. She has her daughter at home to change the dressings. She has been prescribed Santyl and she has a tube that she is brought into our clinic. I cannot get a lot of history of claudication although she is really not that mobile at this point walking with  a walker. 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 She has had multiple interventions on the right side by vein and vascular and also by Dr. Gwenlyn Found as well as a transmetatarsal amputation on the right. She has had follow-up noninvasive studies on 06/06/2021; on the left that showed an ABI of 0.47 with monophasic waveforms a dorsalis pedis ABI of 0.40 with monophasic waveforms. Her ANGIOGRAM which was initially done by Dr. Oneida Alar on 04/28/2021 showed severe left foot superficial femoral artery occlusive disease and an occlusion of the left popliteal artery with two-vessel runoff to the left foot with moderate to severe disease below the knee popliteal artery and tibial disease. She had 80% distal right external iliac artery above the existing femoral above-knee popliteal bypass but she does not have an open wound on the right foot She arrives in clinic with an extensive open area on the TMA site which in the mid aspect extends into the plantar foot. She also has a more superficial area on the upper part of her Achilles area of the left heel 9/29; patient arrives in clinic today with slough over the TMA site. We use  MolecuLight to look at the surface of this suggesting cyan and white discoloration over a large part of this wound also extending into the third metatarsal area. She also has the area on the left posterior calcaneus. This is slough covered we will switch to [facility] here as well. There is some warmth in the area and some erythema we will give her antibiotics as well 10/6; completely necrotic surface once again on the left TMA site. We have been using Santyl and Hydrofera Blue. She has an area on the left posterior Achilles and actually states this has been more painful this week indeed there is some erythema around this area. She also describes I think claudication at rest which is relieved by dipping her left leg over the side of the bed at night. 10/13; our intake nurse was able to brush some slough off the surface of the left TMA hence this did not require debridement that is better than last week. The area heading towards the plantar part of her foot required an aggressive debridement to clean this up. This is quite a sizable divot but does not go to bone. The area on the right Achilles heel is also covered in a high 100% nonviable tissue we have been using Santyl and silver alginate 10/20; the area on the left TMA looks much better. Even the area is spreading into the midfoot looks improved. The right Achilles still is covered and nonviable surface 100%. We have been using Santyl and silver alginate and this will continue for this week The patient is approved for 5 Apligraf's and I think she is good enough to start using these next week 10/27; the patient's wounds actually look quite good. We have been using Santyl and silver alginate. We applied Apligraf #1 today Saw Dr. Stanford Breed her vascular surgeon on 10/25 he told her TMA was healing nicely. She will follow-up with them in December to repeat noninvasive testing. 08/16/2021 upon evaluation today patient appears to be doing much better in regard to  her wounds. Both are showing signs of improvement which is great news. I am going to perform some debridement to clear away some of the necrotic debris currently this will just be a light debridement.  Nonetheless other than that I feel like that the Apligraf is doing quite well working and reapply today and this will be #3 as far as the application is concerned. Electronic Signature(s) Signed: 08/16/2021 12:54:47 PM By: Worthy Keeler PA-C Entered By: Worthy Keeler on 08/16/2021 12:54:46 -------------------------------------------------------------------------------- Physical Exam Details Patient Name: Date of Service: [name]. 08/16/2021 8:30 A M Medical Record Number: [id number] Patient Account Number: [id number] Date of Birth/Sex: Treating RN: Oct 04, 1940 (79 y.o. [name] Primary Care Provider: [name] Other Clinician: Referring Provider: Treating Provider/[name]: Waynette Buttery in Treatment: 8 Constitutional Well-nourished and well-hydrated in no acute distress. Respiratory normal breathing without difficulty. Psychiatric this patient is able to make decisions and demonstrates good insight into disease process. Alert and Oriented x 3. pleasant and cooperative. Notes Upon inspection patient's wound bed showed signs of good granulation epithelization at this point. Fortunately I do not see any signs of active infection locally nor systemically which is great news. No fevers, chills, nausea, vomiting, or diarrhea. Electronic Signature(s) Signed: 08/16/2021 12:55:32 PM By: Worthy Keeler PA-C Entered By: Worthy Keeler on 08/16/2021 12:55:32 -------------------------------------------------------------------------------- Physician Orders Details Patient Name: Date of Service: [name], [name] 08/16/2021 8:30 A M Medical Record Number: [id number] Patient Account Number: [id number] Date of Birth/Sex: Treating  RN: [birth year] (80 y.o. [name] Primary Care Provider: [name] Other Clinician: Referring Provider: Treating Provider/[name]: Waynette Buttery in Treatment: 8 Verbal / Phone Orders: No Diagnosis Coding ICD-10 Coding Code Description E11.621 Type 2 diabetes mellitus with foot ulcer T81.31XD Disruption of external operation (surgical) wound, not elsewhere classified, subsequent encounter E11.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 L97.528 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ity L97.428 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 Follow-up Appointments ppointment in 2 weeks. - Dr. Dellia Nims Return A Cellular or Tissue Based Products Cellular or Tissue Based Product Type: - Apligraf #3 applied daptic or Mepitel. (DO NOT REMOVE). - May Cellular or Tissue Based Product applied to wound bed, secured with steri-strips, cover with A change secondary dressing only as needed for drainage. DO NOT REMOVE steristrips or clean wound bed Bathing/ Shower/ Hygiene May shower with protection but do not get wound dressing(s) wet. Off-Loading Open toe surgical shoe to: - right foot Wedge shoe to: - left foot Home Health No change in wound care orders this week; continue Home Health for wound care. May utilize formulary equivalent dressing for wound treatment orders unless otherwise specified. - apligraf applied. May change secondary dressing only as needed for drainage Other Home Health Orders/Instructions: - [facility] 3x a week Wound Treatment Wound #13 - Amputation Site - Transmetatarsal Wound Laterality: Left Cleanser: Soap and Water 1 x Per Week/7 Days Discharge Instructions: May shower and wash wound with dial antibacterial soap and water prior to dressing change. Cleanser: Wound Cleanser (Home Health) 1 x Per Week/7  Days Discharge Instructions: Cleanse the wound with wound cleanser or normal saline prior to applying a clean dressing using gauze sponges, not tissue or cotton balls. Prim Dressing: Apligraf 1 x Per Week/7 Days ary Discharge Instructions: 3 Secondary Dressing: Woven Gauze Sponge, Non-Sterile 4x4 in (Home Health) 1 x Per Week/7 Days Discharge Instructions: Apply over primary dressing as directed. Secondary Dressing: ABD Pad, 5x9 (Home Health) 1 x Per Week/7 Days Discharge Instructions: Apply over primary dressing as directed. Secondary  Dressing: ADAPTIC TOUCH 3x4.25 in 1 x Per Week/7 Days Discharge Instructions: Apply over primary dressing and secured with steristrips Secured With: Kerlix Roll Sterile, 4.5x3.1 (in/yd) (Home Health) 1 x Per Week/7 Days Discharge Instructions: Secure with Kerlix as directed. Secured With: Paper Tape, 2x10 (in/yd) (Home Health) 1 x Per Week/7 Days Discharge Instructions: Secure dressing with tape as directed. Wound #14 - Calcaneus Wound Laterality: Left, Posterior Cleanser: Soap and Water 1 x Per Week/7 Days Discharge Instructions: May shower and wash wound with dial antibacterial soap and water prior to dressing change. Cleanser: Wound Cleanser (Home Health) 1 x Per Week/7 Days Discharge Instructions: Cleanse the wound with wound cleanser or normal saline prior to applying a clean dressing using gauze sponges, not tissue or cotton balls. Prim Dressing: Apligraf 1 x Per Week/7 Days ary Discharge Instructions: 3 Secondary Dressing: Woven Gauze Sponge, Non-Sterile 4x4 in (Home Health) 1 x Per Week/7 Days Discharge Instructions: Apply over primary dressing as directed. Secondary Dressing: Zetuvit Plus 4x8 in 1 x Per Week/7 Days Discharge Instructions: Add zetuvit to heel for extra protection from pt. rubbing heel againts back of her shoe. Apply over primary dressing as directed. Secondary Dressing: ADAPTIC TOUCH 3x4.25 in 1 x Per Week/7 Days Discharge  Instructions: Apply over primary dressing and secured with steristrips Secured With: Kerlix Roll Sterile, 4.5x3.1 (in/yd) (Home Health) 1 x Per Week/7 Days Discharge Instructions: Secure with Kerlix as directed. Secured With: Paper Tape, 2x10 (in/yd) (Home Health) 1 x Per Week/7 Days Discharge Instructions: Secure dressing with tape as directed. Electronic Signature(s) Signed: 08/16/2021 3:00:15 PM By: Rhae Hammock RN Signed: 08/16/2021 5:33:12 PM By: Worthy Keeler PA-C Entered By: Rhae Hammock on 08/16/2021 09:21:45 -------------------------------------------------------------------------------- Problem List Details Patient Name: Date of Service: [name], [place]. 08/16/2021 8:30 A M Medical Record Number: [id number] Patient Account Number: [id number] Date of Birth/Sex: Treating RN: [birth year] (80 y.o. [name], [name] Primary Care Provider: [name] Other Clinician: Referring Provider: Treating Provider/[name]: Waynette Buttery in Treatment: 8 Active Problems ICD-10 Encounter Code Description Active Date MDM Diagnosis E11.621 Type 2 diabetes mellitus with foot ulcer 06/15/2021 No Yes T81.31XD Disruption of external operation (surgical) wound, not elsewhere classified, 06/15/2021 No Yes subsequent encounter E11.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 06/15/2021 No Yes L97.528 Non-pressure chronic ulcer of other part of left foot with other specified 06/15/2021 No Yes severity L97.428 Non-pressure chronic ulcer of left heel and midfoot with other specified 06/15/2021 No Yes severity Inactive Problems Resolved Problems Electronic Signature(s) Signed: 08/16/2021 9:04:45 AM By: Worthy Keeler PA-C Entered By: Worthy Keeler on 08/16/2021 09:04:45 -------------------------------------------------------------------------------- Progress Note Details Patient Name: Date of Service: [name], [place]. 08/16/2021 8:30 A M Medical Record Number: [id number] Patient Account Number: [id number] Date of Birth/Sex: Treating RN: 12-06-1940 (80 y.o. [name] Primary Care Provider: [name] Other Clinician: Referring Provider: Treating Provider/[name]: Waynette Buttery in Treatment: 8 Subjective Chief Complaint Information obtained from Patient 07/13/2019; patient is here for review wounds on the toes of her right foot and the left fourth toe 08/04/2020; patient is here for review of wounds at the anterior part of her right first met head/TMA site and another area on the right medial calf at a vein harvest site. History of Present Illness (HPI) ADMISSION 07/13/2019 Patient is a 80 year old type II diabetic. She has known PAD. She has been followed by Dr. Jacqualyn Posey of podiatry for blistering areas  on her toes dating back to 04/30/2019 which at this was the left fourth toe. By 8/11 she had wounds on the right and left second toes. She underwent arterial studies by Dr. Alvester Chou on 7/31 that showed ABIs on the right of 0.60 TBI of 0.26 on the left ABI of 0.56 and a TBI of 0.25. By 9/10 she had ischemic-looking wounds per Dr. Earleen Newport on the right first, left first second and third. She has been using Medihoney and then mupirocin more recently simply Betadine. The patient underwent angiography by Dr. Gwenlyn Found on 9/21. 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 Noted that there was 1 vessel runoff in the foot via the peroneal. ooOn the left there was an 80% left SFA, 70% mid left SFA. There was a short segment calcified CTO distal less than SFA and above-knee popliteal artery reconstituting with two-vessel runoff. 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 It was felt that she had bilateral SFA disease as well as tibial vessel disease. An attempt  was made to revascularize the left SFA but they were unable to cross because of the highly calcified nature of the lesion. 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 She has an area on the medial left fourth toe with raised horned callus on top of this. I am not certain what this represents. With regards to pain she has about a 2-1/2 I will claudication tolerance in the grocery store. She has some pain in night which is relieved by putting her feet down over the side of the bed. She is wearing Nitro-Dur patches on the top of her right foot. Past medical history includes type 2 diabetes with secondary PAD, neoplasm of the skin, diabetic retinopathy, carotid artery stenosis, hyperlipidemia hypertension. 11/2; the last time the patient was here I spoke to Dr. Gwenlyn Found about revascularization options on the right. As I understand things currently Dr. Gwenlyn Found spoke with Dr. Trula Slade and ultimately the patient was taken to surgery on 10/27. She had a right iliofemoral endarterectomy with a bovine patch angioplasty. I think the plan now is for her to have a staged intervention on the right SFA by Dr. Alvester Chou. Per the patient's understanding Dr. Gwenlyn Found and Dr. Jacqualyn Posey are waiting to see when the gangrenous toes on the right foot can be amputated. The patient states her pain is a lot better and she is grateful for that. 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 Small area on the left fourth toe. She is using Betadine to the mummified areas on the right and Medihoney on the left 08/27/2019; the last time I saw this patient I discharged her from the clinic. She had been revascularized by Dr. Trula Slade and she had a right iliofemoral endarterectomy with a bovine angioplasty. 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  Jacqualyn Posey of podiatry on 08/07/2019. I note that she also had a intervention by Dr. Gwenlyn Found and he performed a directional atherectomy and drug-coated balloon angioplasty of the SFA and popliteal artery on the right. I am not certain of the date of Dr. Kennon Holter procedure as of the time of this dictation. She was referred back to [place] by Dr. Earleen Newport predominantly for  follow-up of the left fourth toe. She still has sutures and stitches in the right TMA site. She states her pain is a lot better. She expresses concern about the condition of the amputation site at the TMA. She is on doxycycline I think prescribed by Dr. Earleen Newport. She is complaining of some pain at night 12/10; I spoke to Dr. Jacqualyn Posey last week. He removed the sutures on the right foot on Monday of this week. She has the area on the left fourth toe just proximal to the PIP and then the right TMA site. She is still on doxycycline and has enough through next week. Unfortunately the TMA site does not look good at all. Both on the lateral and medial part of the incisions are areas that probe to bone. There is purulence over the medial part which I have cultured. We will use silver alginate. Left fourth toe looks somewhat better but there was still exposed bone 12/17; patient has an MRI booked for 12/30. Culture I did last week showed Pseudomonas Serratia and Enterococcus. This was purulent drainage coming out of the medial part of her amputation site. I use cefdinir 300 twice daily for 10 days that started on 12/15. Her x-ray on the right showed limited evaluation for osteomyelitis. The findings could have been postoperative. There was subtle erosion in the distal first and distal fifth metatarsal. An MRI was suggested. On the left she had irregularity of the fourth middle and proximal phalanx consistent with a history of osteomyelitis. I do not know that she has a history of osteomyelitis in this area. She had a newly defined area on the plantar third  toe 12/31; patient's MRI is listed below: MRI OF THE RIGHT FOREFOOT WITHOUT AND WITH CONTRAST TECHNIQUE: Multiplanar, multisequence MR imaging of the right forefoot was performed before and after the administration of intravenous contrast. CONTRAST 6 mL GADAVIST IV SOLN : COMPARISON: Plain films right foot 09/04/2019. FINDINGS: Bones/Joint/Cartilage The patient is status post transmetatarsal amputation as seen on the prior exam. Marrow edema and enhancement are seen in all of the distal metatarsals. In the first metatarsal, signal change extends 1.5 cm proximal to the stump and in the second metatarsal extends approximately 2 cm proximal to the stump. Edema and enhancement are seen in only the distal 0.7 cm of the third metatarsal stump and tips of the fourth and fifth metatarsals. Bone marrow signal is otherwise unremarkable. A small focus of subchondral edema is seen in the lateral talus, likely degenerative. Ligaments Intact. Muscles and Tendons No intramuscular fluid collection. Soft tissues Skin ulceration is seen off the stump of the first metatarsal. A thin fluid collection tracks deep to the wound and over the anterior metatarsals worrisome for small abscess. Intense subcutaneous edema and enhancement are seen diffusely. 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 Cellulitis about the foot. Skin ulceration over the distal first metatarsal is identified with a thin fluid collection tracking anteriorly along the stump worrisome for abscess. Small focus of subchondral edema in the lateral talus is likely degenerative. Electronically Signed By: Inge Rise M.D. On: 09/23/2019 15:25 Patient arrives in clinic today not complaining any of any pain. We have been using silver alginate to the predominant areas in the surgical site on  her right transmetatarsal amputation. She does not describe claudication however her activity is very limited. 10/01/2019; since the patient was last here I have communicated  with Dr. Gwenlyn Found after bypass by Dr. Trula Slade and addressing the right superficial femoral artery he states that she is widely patent through peroneal arteries to the ankle with collaterals to the dorsalis pedis. He states he is going to talk to colleagues about the feasibility of tibial pedal access. The patient seems infectious disease later this afternoon Dr. Baxter Flattery. in preparation for this she has been off antibiotics for 1 week and I went ahead and obtain pieces of the remanence of her first metatarsal for pathology and CandS. The patient is a candidate for hyperbaric oxygen. 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 1/15; considerable improvement in the wounds on her feet. We are using silver collagen. She follows with Dr. Baxter Flattery of infectious disease and is on meropenem and daptomycin. She has been taught how to do this herself at home. Follows with Dr. Graylon Good at the end of treatment here. She has 2 wounds on the surgical TMA site 1 lateral and 1 medial the lateral 1 has regressed tremendously. The area medially still has some exposed bone although the base of this looks healthy. 1/22; 2 separate wounds on the surgical TMA site. Both of these look satisfactory. The medial area does not have any exposed bone. This is an improvement On the left side fourth toe dorsally over the proximal phalanx there is a deep punched out area that probes to bone. She has an area on the tip of the left third toe. She also tells [place] that in HBO she traumatized her left Achilles and this is left her with a superficial wound area 1/28; weekly visit along with HBO. She has 5 wounds. T our punched out areas on the original TMA site on the right. Both of these appear to have contracted. o The area on the  right no longer has exposed bone. She has an area on the tip of the left third toe and on the DIP of the left fourth toe. Both of these had surface callus that I removed and unfortunately they have small areas that both go to bone. She has a traumatic wound on the left Achilles area that happened in HBO and that appears better. She is tolerating her IV antibiotics well at home. She has home health changing the dressings and she is doing it once on the weekends. We have been using silver collagen. She has been extensively revascularized on the right by Dr. Trula Slade and subsequently by Dr. Gwenlyn Found. 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 Baxter Flattery of infectious disease on 2/17. Dr. Gwenlyn Found on 2/23 and according to her her antibiotics stopped on 2/23. She is tolerating hyperbarics well. She has made a tremendous improvement in the right forefoot with only 2 smaller open wounds. Using silver collagen. On the left foot she has the area on the tip of the left third toe and the medial part of the left fourth toe. These had exposed bone last week I did not sense any of that today 2/12; sees Dr. Graylon Good on 2/17 and Dr. Gwenlyn Found on 2/23. We are going to use Dermagraft on this today however the lateral part of her train TMA incision on the right is healed and the medial part is down so much that we just continue with the silver collagen She has wounds on the tip of  her left third and the medial aspect of the left fourth. Both of these still have exposed bone I have not been able to get these to epithelialize. 2/19; she sees Dr. Gwenlyn Found on 2/23 and I have communicated with him about the left vascular supply. Looking at her angiogram from 08/03/2019 it looks as though that they could not cross the left CFA. Noted that she had one-vessel runoff bilaterally. She also apparently saw Dr. Graylon Good  although I did not look up these results for preparation of this record. 2/26; Dr. Gwenlyn Found is going to do an angiogram next week on Wednesday. I think they are going to try to go at this both retrograde and anterograde to see if anything can be done to revascularize the left lower limb. She continues to make nice progress on the remaining wound on the right medial foot on her TMA site and the toe it wounds on the left are responding nicely as well 3/12; the patient underwent an extensive and complicated revascularization on the left leg by Dr. Fletcher Anon on 3/3; she had an atherectomy on occluded left popliteal artery; anterior tibial artery followed by a balloon angioplasty. Atherectomy was also performed to the left SFA because there was still significant 50% stenosis in the left popliteal artery they performed an intravascular lithotripsy which improved the residual stenosis to 20%. The same lithotripsy was used to dilate the proximal left SFA. She had a drug-eluting stent placed I believe in the SFA. The patient returned for hyperbarics this week. She has had some eye problems on the right which she tells me are secondary to diabetic retinopathy and she saw her eye doctor. She is really made excellent progress there is no open wounds on the left foot at all. She has one open area medially and her TMA site on the right lateral wound is closed. 3/19; the patient comes in today with the area on the medial transmet on the right improved. Some debris removed from the surface revealed the still open wound. ooUnfortunately she now has the open area on the third toe tip and the medial aspect of the dorsal fourth toe. These are in the same location as her previous wounds. These had actually closed up last week. 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 She is undergone complex revascularizations on the right by Dr. Trula Slade and Dr. Gwenlyn Found and I believe Dr. 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 She completed IVantibiotics as directed by infectious disease and I believe a course of oral antibiotics as well.she underwent a complete 40 treatments of hyperbarics. She comes in today with the vast majority of the right TMA site healed there is a small opening on the most medial aspect. Her left third and left fourth toes appear to have epithelialization although this has fluctuated somewhat. 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 She had a gaping wound on the right transmetatarsal amputation L as well as deep wounds on the left third and left fourth toes. She underwent complex revascularizations on her bilateral lower legs which I described on her last visit. She completed IV antibiotics by infectious disease and 40 treatments of hyperbarics. Her left third and fourth toes are healed. The  area on the right transmetatarsal amputation site is also closed today. READMISSION 08/04/2020 [name] is now a 80 year old woman that we had for a complex stay in our clinic from October 2000 14 January 2020. 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 Her current problems with wounds began on October 30 when she developed a spontaneous opening over the right transmet medial aspect over the first metatarsal head. She has been using collagen on this that she had leftover from her last stay in this clinic. She also has had an open area on the medial right calf from a vein harvest site from her CABG she. She said that this is never really closed since the vein harvest was done. With  regards to her arterial status this is a major issue. She had an angiogram by Dr. Gwenlyn Found in September 2020. She developed a gangrenous right first and second toes. Ultimately she was referred to Dr. Trula Slade who did a right common femoral endarterectomy with patch angioplasty. This was on 07/21/2019 and she really had a good result. Dr. Gwenlyn Found did a atherectomy followed by a drug coated balloon angioplasty of her right SFA popliteal and tibial peroneal trunk on 08/03/2019 again with excellent excellent results. Follow-up.Dopplers in November revealed revealed a widely patent SFA and tibioperoneal trunk. UNFORTUNATELY her recent angiogram that was done on 08/01/2020 showed an 80% right common femoral artery stenosis just above the previous endarterectomy site occluded right right SFA at the origin reconstituting the adductor canal by the profunda femoris collaterals. The profunda femoris is also diffusely diseased. There is and again another 80% tibioperoneal trunk stenosis and one-vessel runoff via the peroneal artery. She is seen Dr. Donzetta Matters and she is scheduled for a femoral endarterectomy and right femoral-popliteal bypass grafting with endarterectomy of her tibial peroneal trunk. The scheduled surgery is on 11/23 The patient does not complain of pain at rest. She does have 5-minute walking claudication. The wounds on her right first met head remanent was spontaneous she does not think she hit this. As mentioned in the area on her right medial calf is a vein harvest site Dr. Gwenlyn Found had texted me about getting her in the clinic and we have arranged this as soon as we can. I have told her that we will have to see how successful Dr. Donzetta Matters is before we know what can be done about the wounds in a precise fashion. Fortunately there does not seem to be any obvious infection involving the right foot although that may need to be looked at if things really deteriorate. She was treated with IV antibiotics and hyperbaric  oxygen for her previous osteomyelitis 12/2; the patient underwent a right femoral endarterectomy and bypass femoral to popliteal artery. This was done on 08/16/2020. She is done remarkably well. She has a small area on the right anterior tibia and a small area in the medial part of her original right transmetatarsal amputation. She appears to have had an excellent response to surgery. 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 Gieselman is a now 80 year old woman who arrives for review of wounds on the left TMA site extending into her plantar foot as well as an area on the left posterior calcaneus. We  had her extensively in 2021 for a failed right TMA in the setting of type 2 diabetes with angiopathy and underlying osteomyelitis. She was extensively revascularized on the right at that time as noted above in our previous notes ended up receiving IV antibiotics and hyperbaric oxygen therapy. Miraculously this TMA site actually closed and she really has had not any trouble with this since. UNFORTUNATELY the same cannot be said of her left side. Her problem apparently started sometime in July when she hit her left third toe on a barstool while playing pool. She developed an acute infection which eventually led to underlying osteomyelitis. She underwent an extensive hospitalization from 04/27/2021 through 05/15/2021. She received IV antibiotics. Also extensive intervention by Dr. Stanford Breed of vein and vascular which included a left common femoral to tibioperoneal trunk bypass as well as a left third toe amputation on 05/03/2021. Unfortunately the area did not heal she ended up requiring a left TMA. By her description this was left open. She has been using a wound VAC on this to earlier this week and then was switched to Santyl. I think because of the left forefoot off loader she is  also developed an area on the left posterior calcaneus. She is not complaining of a lot of pain. She has her daughter at home to change the dressings. She has been prescribed Santyl and she has a tube that she is brought into our clinic. I cannot get a lot of history of claudication although she is really not that mobile at this point walking with a walker. 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 She has had multiple interventions on the right side by vein and vascular and also by Dr. Gwenlyn Found as well as a transmetatarsal amputation on the right. She has had follow-up noninvasive studies on 06/06/2021; on the left that showed an ABI of 0.47 with monophasic waveforms a dorsalis pedis ABI of 0.40 with monophasic waveforms. Her ANGIOGRAM which was initially done by Dr. Oneida Alar on 04/28/2021 showed severe left foot superficial femoral artery occlusive disease and an occlusion of the left popliteal artery with two-vessel runoff to the left foot with moderate to severe disease below the knee popliteal artery and tibial disease. She had 80% distal right external iliac artery above the existing femoral above-knee popliteal bypass but she does not have an open wound on the right foot She arrives in clinic with an extensive open area on the TMA site which in the mid aspect extends into the plantar foot. She also has a more superficial area on the upper part of her Achilles area of the left heel 9/29; patient arrives in clinic today with slough over the TMA site. We use MolecuLight to look at the surface of this suggesting cyan and white discoloration over a large part of this wound also extending into the third metatarsal area. She also has the area on the left posterior calcaneus. This is slough covered we will switch to [facility] here as well. There is some warmth in the area  and some erythema we will give her antibiotics as well 10/6; completely necrotic surface once again on the left TMA site. We have been using Santyl and Hydrofera Blue. She has an area on the left posterior Achilles and actually states this has been more painful this week indeed there is some erythema around this area. She also describes  I think claudication at rest which is relieved by dipping her left leg over the side of the bed at night. 10/13; our intake nurse was able to brush some slough off the surface of the left TMA hence this did not require debridement that is better than last week. The area heading towards the plantar part of her foot required an aggressive debridement to clean this up. This is quite a sizable divot but does not go to bone. The area on the right Achilles heel is also covered in a high 100% nonviable tissue we have been using Santyl and silver alginate 10/20; the area on the left TMA looks much better. Even the area is spreading into the midfoot looks improved. The right Achilles still is covered and nonviable surface 100%. We have been using Santyl and silver alginate and this will continue for this week The patient is approved for 5 Apligraf's and I think she is good enough to start using these next week 10/27; the patient's wounds actually look quite good. We have been using Santyl and silver alginate. We applied Apligraf #1 today Saw Dr. Stanford Breed her vascular surgeon on 10/25 he told her TMA was healing nicely. She will follow-up with them in December to repeat noninvasive testing. 08/16/2021 upon evaluation today patient appears to be doing much better in regard to her wounds. Both are showing signs of improvement which is great news. I am going to perform some debridement to clear away some of the necrotic debris currently this will just be a light debridement. Nonetheless other than that I feel like that the Apligraf is doing quite well working and reapply today and  this will be #3 as far as the application is concerned. Objective Constitutional Well-nourished and well-hydrated in no acute distress. Vitals Time Taken: 8:40 AM, Height: 64 in, Weight: 135 lbs, BMI: 23.2, Temperature: 97.4 F, Pulse: 89 bpm, Respiratory Rate: 17 breaths/min, Blood Pressure: 160/74 mmHg, Capillary Blood Glucose: 106 mg/dl. Respiratory normal breathing without difficulty. Psychiatric this patient is able to make decisions and demonstrates good insight into disease process. Alert and Oriented x 3. pleasant and cooperative. General Notes: Upon inspection patient's wound bed showed signs of good granulation epithelization at this point. Fortunately I do not see any signs of active infection locally nor systemically which is great news. No fevers, chills, nausea, vomiting, or diarrhea. Integumentary (Hair, Skin) Wound #13 status is Open. Original cause of wound was Surgical Injury. The date acquired was: 05/11/2021. The wound has been in treatment 8 weeks. The wound is located on the Left Amputation Site - Transmetatarsal. The wound measures 4.3cm length x 3.9cm width x 0.5cm depth; 13.171cm^2 area and 6.586cm^3 volume. There is Fat Layer (Subcutaneous Tissue) exposed. There is no tunneling or undermining noted. There is a medium amount of serosanguineous drainage noted. The wound margin is distinct with the outline attached to the wound base. There is large (67-100%) red granulation within the wound bed. There is a small (1-33%) amount of necrotic tissue within the wound bed including Adherent Slough. Wound #14 status is Open. Original cause of wound was Pressure Injury. The date acquired was: 05/11/2021. The wound has been in treatment 8 weeks. The wound is located on the Left,Posterior Calcaneus. The wound measures 0.6cm length x 1.4cm width x 0.2cm depth; 0.66cm^2 area and 0.132cm^3 volume. There is Fat Layer (Subcutaneous Tissue) exposed. There is no tunneling or undermining  noted. There is a medium amount of serous drainage noted. The wound margin is flat  and intact. There is medium (34-66%) red granulation within the wound bed. There is a medium (34-66%) amount of necrotic tissue within the wound bed including Adherent Slough. 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 There was a Excisional Skin/Subcutaneous Tissue Debridement with a total area of 16.77 sq cm performed by Worthy Keeler, PA. With the following instrument(s): Curette to remove Viable and Non-Viable tissue/material. Material removed includes Subcutaneous Tissue, Slough, Skin: Dermis, Skin: Epidermis, and Biofilm after achieving pain control using Lidocaine. No specimens were taken. A time out was conducted at 09:17, prior to the start of the procedure. A Minimum amount of bleeding was controlled with Pressure. The procedure was tolerated well with a pain level of 0 throughout and a pain level of 0 following the procedure. Post Debridement Measurements: 4.3cm length x 3.9cm width x 0.5cm depth; 6.586cm^3 volume. Character of Wound/Ulcer Post Debridement is improved. Severity of Tissue Post Debridement is: Fat layer exposed. Post procedure Diagnosis Wound #13: Same as Pre-Procedure Pre-procedure diagnosis of Wound #13 is a Diabetic Wound/Ulcer of the Lower Extremity located on the Left Amputation Site -  Transmetatarsal. A skin graft procedure using a bioengineered skin substitute/cellular or tissue based product was performed by Worthy Keeler, PA with the following instrument(s): Blade, Forceps, and Scissors. Apligraf was applied and secured with Steri-Strips. 44 sq cm of product was utilized and 0 sq cm was wasted. Post Application, adaptic was applied. A Time Out was conducted at 09:19, prior to the start of the procedure. The procedure was tolerated well with a pain level of 0 throughout and a pain level of 0 following the procedure. Post procedure Diagnosis Wound #13: Same as Pre-Procedure . Wound #14 Pre-procedure diagnosis of Wound #14 is a Diabetic Wound/Ulcer of the Lower Extremity located on the Left,Posterior Calcaneus .Severity of Tissue Pre Debridement is: Fat layer exposed. There was a Excisional Skin/Subcutaneous Tissue Debridement with a total area of 0.84 sq cm performed by Worthy Keeler, PA. With the following instrument(s): Curette to remove Viable and Non-Viable tissue/material. Material removed includes Subcutaneous Tissue, Slough, Skin: Dermis, Skin: Epidermis, and Biofilm after achieving pain control using Lidocaine. No specimens were taken. A time out was conducted at 09:18, prior to the start of the procedure. A Minimum amount of bleeding was controlled with Pressure. The procedure was tolerated well with a pain level of 0 throughout and a pain level of 0 following the procedure. Post Debridement Measurements: 0.6cm length x 1.4cm width x 0.2cm depth; 0.132cm^3 volume. Character of Wound/Ulcer Post Debridement is improved. Severity of Tissue Post Debridement is: Fat layer exposed. Post procedure Diagnosis Wound #14: Same as Pre-Procedure Plan Follow-up Appointments: Return Appointment in 2 weeks. - Dr. Dellia Nims Cellular or Tissue Based Products: Cellular or Tissue Based Product Type: - Apligraf #3 applied Cellular or Tissue Based Product applied to wound bed, secured  with steri-strips, cover with Adaptic or Mepitel. (DO NOT REMOVE). - May change secondary dressing only as needed for drainage. DO NOT REMOVE steristrips or clean wound  bed Bathing/ Shower/ Hygiene: May shower with protection but do not get wound dressing(s) wet. Off-Loading: Open toe surgical shoe to: - right foot Wedge shoe to: - left foot Home Health: No change in wound care orders this week; continue Home Health for wound care. May utilize formulary equivalent dressing for wound treatment orders unless otherwise specified. - apligraf applied. May change secondary dressing only as needed for drainage Other Home Health Orders/Instructions: - [facility] 3x a week WOUND #13: - Amputation Site - Transmetatarsal Wound Laterality: Left Cleanser: Soap and Water 1 x Per Week/7 Days Discharge Instructions: May shower and wash wound with dial antibacterial soap and water prior to dressing change. Cleanser: Wound Cleanser (Home Health) 1 x Per Week/7 Days Discharge Instructions: Cleanse the wound with wound cleanser or normal saline prior to applying a clean dressing using gauze sponges, not tissue or cotton balls. Prim Dressing: Apligraf 1 x Per Week/7 Days ary Discharge Instructions: 3 Secondary Dressing: Woven Gauze Sponge, Non-Sterile 4x4 in (Home Health) 1 x Per Week/7 Days Discharge Instructions: Apply over primary dressing as directed. Secondary Dressing: ABD Pad, 5x9 (Home Health) 1 x Per Week/7 Days Discharge Instructions: Apply over primary dressing as directed. Secondary Dressing: ADAPTIC TOUCH 3x4.25 in 1 x Per Week/7 Days Discharge Instructions: Apply over primary dressing and secured with steristrips Secured With: Kerlix Roll Sterile, 4.5x3.1 (in/yd) (Home Health) 1 x Per Week/7 Days Discharge Instructions: Secure with Kerlix as directed. Secured With: Paper T ape, 2x10 (in/yd) (Home Health) 1 x Per Week/7 Days Discharge Instructions: Secure dressing with tape as directed. WOUND #14:  - Calcaneus Wound Laterality: Left, Posterior Cleanser: Soap and Water 1 x Per Week/7 Days Discharge Instructions: May shower and wash wound with dial antibacterial soap and water prior to dressing change. Cleanser: Wound Cleanser (Home Health) 1 x Per Week/7 Days Discharge Instructions: Cleanse the wound with wound cleanser or normal saline prior to applying a clean dressing using gauze sponges, not tissue or cotton balls. Prim Dressing: Apligraf 1 x Per Week/7 Days ary Discharge Instructions: 3 Secondary Dressing: Woven Gauze Sponge, Non-Sterile 4x4 in (Home Health) 1 x Per Week/7 Days Discharge Instructions: Apply over primary dressing as directed. Secondary Dressing: Zetuvit Plus 4x8 in 1 x Per Week/7 Days Discharge Instructions: Add zetuvit to heel for extra protection from pt. rubbing heel againts back of her shoe. Apply over primary dressing as directed. Secondary Dressing: ADAPTIC TOUCH 3x4.25 in 1 x Per Week/7 Days Discharge Instructions: Apply over primary dressing and secured with steristrips Secured With: Kerlix Roll Sterile, 4.5x3.1 (in/yd) (Home Health) 1 x Per Week/7 Days Discharge Instructions: Secure with Kerlix as directed. Secured With: Paper T ape, 2x10 (in/yd) (Home Health) 1 x Per Week/7 Days Discharge Instructions: Secure dressing with tape as directed. 1. Would recommend currently that we go ahead and continue with the Apligraf #3 was applied today and she was secured with Adaptic and Steri-Strips. 2. I am also can recommend that she continue with the offloading shoe we did discuss padding this little bit more to try to keep it from rubbing the back of her heel she is definitely can do that were also using a Zetuvit over the heel to try to provide some padding underneath the wrap. 3. I am also can recommend that we continue to use roll gauze to secure in place. We will see patient back for reevaluation in 2 weeks here in the clinic. If anything worsens or changes  patient will contact our office for additional recommendations. Electronic  Signature(s) Signed: 08/16/2021 12:55:59 PM By: Worthy Keeler PA-C Entered By: Worthy Keeler on 08/16/2021 12:55:58 -------------------------------------------------------------------------------- SuperBill Details Patient Name: Date of Service: [name], [name] 08/16/2021 Medical Record Number: [id number] Patient Account Number: [id number] Date of Birth/Sex: Treating RN: [id number] (80 y.o. 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 Facility Procedures CPT4 Code: [id number] Description: (Facility Use Only) Apligraf 1 SQ CM Modifier: Quantity: [place] Code: [id number] Description: 64383 - SKIN SUB GRAFT FACE/NK/HF/G ICD-10 Diagnosis Description L97.528 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ity Modifier: Quantity: 1 Physician Procedures : CPT4 Code Description Modifier [id number] - WC PHYS SKIN SUB GRAFT FACE/NK/HF/G ICD-10 Diagnosis Description L97.528 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ity Quantity: 1 Electronic Signature(s) Signed: 08/16/2021 12:56:19 PM By: Worthy Keeler PA-C Entered By: Worthy Keeler on 08/16/2021 67:70:34

## 2021-08-21 DIAGNOSIS — Z4801 Encounter for change or removal of surgical wound dressing: Secondary | ICD-10-CM | POA: Diagnosis not present

## 2021-08-21 DIAGNOSIS — Z4781 Encounter for orthopedic aftercare following surgical amputation: Secondary | ICD-10-CM | POA: Diagnosis not present

## 2021-08-21 DIAGNOSIS — M86172 Other acute osteomyelitis, left ankle and foot: Secondary | ICD-10-CM | POA: Diagnosis not present

## 2021-08-21 DIAGNOSIS — D638 Anemia in other chronic diseases classified elsewhere: Secondary | ICD-10-CM | POA: Diagnosis not present

## 2021-08-21 DIAGNOSIS — L89620 Pressure ulcer of left heel, unstageable: Secondary | ICD-10-CM | POA: Diagnosis not present

## 2021-08-21 DIAGNOSIS — I70202 Unspecified atherosclerosis of native arteries of extremities, left leg: Secondary | ICD-10-CM | POA: Diagnosis not present

## 2021-08-23 DIAGNOSIS — L89620 Pressure ulcer of left heel, unstageable: Secondary | ICD-10-CM | POA: Diagnosis not present

## 2021-08-23 DIAGNOSIS — M86172 Other acute osteomyelitis, left ankle and foot: Secondary | ICD-10-CM | POA: Diagnosis not present

## 2021-08-23 DIAGNOSIS — I70202 Unspecified atherosclerosis of native arteries of extremities, left leg: Secondary | ICD-10-CM | POA: Diagnosis not present

## 2021-08-23 DIAGNOSIS — D638 Anemia in other chronic diseases classified elsewhere: Secondary | ICD-10-CM | POA: Diagnosis not present

## 2021-08-23 DIAGNOSIS — Z4781 Encounter for orthopedic aftercare following surgical amputation: Secondary | ICD-10-CM | POA: Diagnosis not present

## 2021-08-23 DIAGNOSIS — Z4801 Encounter for change or removal of surgical wound dressing: Secondary | ICD-10-CM | POA: Diagnosis not present

## 2021-08-28 DIAGNOSIS — L89620 Pressure ulcer of left heel, unstageable: Secondary | ICD-10-CM | POA: Diagnosis not present

## 2021-08-28 DIAGNOSIS — M86172 Other acute osteomyelitis, left ankle and foot: Secondary | ICD-10-CM | POA: Diagnosis not present

## 2021-08-28 DIAGNOSIS — I70202 Unspecified atherosclerosis of native arteries of extremities, left leg: Secondary | ICD-10-CM | POA: Diagnosis not present

## 2021-08-28 DIAGNOSIS — Z4781 Encounter for orthopedic aftercare following surgical amputation: Secondary | ICD-10-CM | POA: Diagnosis not present

## 2021-08-28 DIAGNOSIS — D638 Anemia in other chronic diseases classified elsewhere: Secondary | ICD-10-CM | POA: Diagnosis not present

## 2021-08-28 DIAGNOSIS — Z4801 Encounter for change or removal of surgical wound dressing: Secondary | ICD-10-CM | POA: Diagnosis not present

## 2021-08-30 ENCOUNTER — Encounter (HOSPITAL_BASED_OUTPATIENT_CLINIC_OR_DEPARTMENT_OTHER): Payer: Medicare Other | Admitting: Internal Medicine

## 2021-08-30 DIAGNOSIS — Z4781 Encounter for orthopedic aftercare following surgical amputation: Secondary | ICD-10-CM | POA: Diagnosis not present

## 2021-08-30 DIAGNOSIS — L89620 Pressure ulcer of left heel, unstageable: Secondary | ICD-10-CM | POA: Diagnosis not present

## 2021-08-30 DIAGNOSIS — D638 Anemia in other chronic diseases classified elsewhere: Secondary | ICD-10-CM | POA: Diagnosis not present

## 2021-08-30 DIAGNOSIS — M86172 Other acute osteomyelitis, left ankle and foot: Secondary | ICD-10-CM | POA: Diagnosis not present

## 2021-08-30 DIAGNOSIS — I70202 Unspecified atherosclerosis of native arteries of extremities, left leg: Secondary | ICD-10-CM | POA: Diagnosis not present

## 2021-08-30 DIAGNOSIS — Z4801 Encounter for change or removal of surgical wound dressing: Secondary | ICD-10-CM | POA: Diagnosis not present

## 2021-08-31 ENCOUNTER — Encounter (HOSPITAL_BASED_OUTPATIENT_CLINIC_OR_DEPARTMENT_OTHER): Payer: Medicare Other | Attending: Internal Medicine | Admitting: Internal Medicine

## 2021-08-31 ENCOUNTER — Other Ambulatory Visit: Payer: Self-pay

## 2021-08-31 DIAGNOSIS — L97514 Non-pressure chronic ulcer of other part of right foot with necrosis of bone: Secondary | ICD-10-CM | POA: Diagnosis not present

## 2021-08-31 DIAGNOSIS — E11621 Type 2 diabetes mellitus with foot ulcer: Secondary | ICD-10-CM | POA: Diagnosis not present

## 2021-08-31 DIAGNOSIS — T8131XD Disruption of external operation (surgical) wound, not elsewhere classified, subsequent encounter: Secondary | ICD-10-CM | POA: Diagnosis not present

## 2021-08-31 DIAGNOSIS — L97428 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: Secondary | ICD-10-CM | POA: Diagnosis not present

## 2021-08-31 NOTE — Progress Notes (Signed)
BENICIA, BERGEVIN ([id number]) Visit Report for 08/31/2021 Arrival Information Details Patient Name: Date of Service: [name] 08/31/2021 8:30 A M Medical Record Number: [id number] Patient Account Number: [id number] Date of Birth/Sex: Treating RN: 1941/06/28 (80 y.o. [name] Primary Care Vastie Douty: [name] Other Clinician: Referring Unity Luepke: Treating Dupree Givler/[name]: Tressie Stalker in Treatment: 11 Visit Information History Since Last Visit Added or deleted any medications: No Patient Arrived: [name] Any new allergies or adverse reactions: No Arrival Time: 08:37 Had a fall or experienced change in No Accompanied By: self activities of daily living that may affect Transfer Assistance: None risk of falls: Patient Identification Verified: Yes Signs or symptoms of abuse/neglect since last visito No Secondary Verification Process Completed: Yes Hospitalized since last visit: No Patient Requires Transmission-Based Precautions: No Implantable device outside of the clinic excluding No Patient Has Alerts: Yes cellular tissue based products placed in the center Patient Alerts: Patient on Blood Thinner since last visit: L ABI: 0.82 Has Dressing in Place as Prescribed: Yes R ABI: 0.47 Has Footwear/Offloading in Place as Prescribed: Yes Left: Wedge Shoe Pain Present Now: No Electronic Signature(s) Signed: 08/31/2021 4:26:43 PM By: Baruch Gouty RN, BSN Entered By: Baruch Gouty on 08/31/2021 08:39:09 -------------------------------------------------------------------------------- Clinic Level of Care Assessment Details Patient Name: Date of Service: [name] 08/31/2021 8:30 A M Medical Record Number: [id number] Patient Account Number: [id number] Date of Birth/Sex: Treating RN: 1941/02/02 (80 y.o. [name] Primary Care Yasemin Rabon: [name] Other Clinician: Referring Jamille Yoshino: Treating  Shyana Kulakowski/[name]: Tressie Stalker in Treatment: 11 Clinic Level of Care Assessment Items TOOL 4 Quantity Score []  - 0 Use when only an EandM is performed on FOLLOW-UP visit ASSESSMENTS - Nursing Assessment / Reassessment X- 1 10 Reassessment of Co-morbidities (includes updates in patient status) X- 1 5 Reassessment of Adherence to Treatment Plan ASSESSMENTS - Wound and Skin A ssessment / Reassessment []  - 0 Simple Wound Assessment / Reassessment - one wound X- 2 5 Complex Wound Assessment / Reassessment - multiple wounds []  - 0 Dermatologic / Skin Assessment (not related to wound area) ASSESSMENTS - Focused Assessment []  - 0 Circumferential Edema Measurements - multi extremities []  - 0 Nutritional Assessment / Counseling / Intervention []  - 0 Lower Extremity Assessment (monofilament, tuning fork, pulses) []  - 0 Peripheral Arterial Disease Assessment (using hand held doppler) ASSESSMENTS - Ostomy and/or Continence Assessment and Care []  - 0 Incontinence Assessment and Management []  - 0 Ostomy Care Assessment and Management (repouching, etc.) PROCESS - Coordination of Care X - Simple Patient / Family Education for ongoing care 1 15 []  - 0 Complex (extensive) Patient / Family Education for ongoing care X- 1 10 Staff obtains Programmer, systems, Records, T Results / Process Orders est X- 1 10 Staff telephones HHA, Nursing Homes / Clarify orders / etc []  - 0 Routine Transfer to another Facility (non-emergent condition) []  - 0 Routine Hospital Admission (non-emergent condition) []  - 0 New Admissions / Biomedical engineer / Ordering NPWT Apligraf, etc. , []  - 0 Emergency Hospital Admission (emergent condition) X- 1 10 Simple Discharge Coordination []  - 0 Complex (extensive) Discharge Coordination PROCESS - Special Needs []  - 0 Pediatric / Minor Patient Management []  - 0 Isolation Patient Management []  - 0 Hearing / Language / Visual special  needs []  - 0 Assessment of Community assistance (transportation, D/C planning, etc.) []  - 0 Additional assistance / Altered mentation []  - 0 Support Surface(s) Assessment (bed, cushion, seat, etc.)  INTERVENTIONS - Wound Cleansing / Measurement []  - 0 Simple Wound Cleansing - one wound X- 2 5 Complex Wound Cleansing - multiple wounds []  - 0 Wound Imaging (photographs - any number of wounds) []  - 0 Wound Tracing (instead of photographs) []  - 0 Simple Wound Measurement - one wound []  - 0 Complex Wound Measurement - multiple wounds INTERVENTIONS - Wound Dressings X - Small Wound Dressing one or multiple wounds 2 10 []  - 0 Medium Wound Dressing one or multiple wounds []  - 0 Large Wound Dressing one or multiple wounds []  - 0 Application of Medications - topical []  - 0 Application of Medications - injection INTERVENTIONS - Miscellaneous []  - 0 External ear exam []  - 0 Specimen Collection (cultures, biopsies, blood, body fluids, etc.) []  - 0 Specimen(s) / Culture(s) sent or taken to Lab for analysis []  - 0 Patient Transfer (multiple staff / Civil Service fast streamer / Similar devices) []  - 0 Simple Staple / Suture removal (25 or less) []  - 0 Complex Staple / Suture removal (26 or more) []  - 0 Hypo / Hyperglycemic Management (close monitor of Blood Glucose) []  - 0 Ankle / Brachial Index (ABI) - do not check if billed separately X- 1 5 Vital Signs Has the patient been seen at the hospital within the last three years: Yes Total Score: 105 Level Of Care: New/Established - Level 3 Electronic Signature(s) Signed: 08/31/2021 4:26:43 PM By: Baruch Gouty RN, BSN Entered By: Baruch Gouty on 08/31/2021 09:06:00 -------------------------------------------------------------------------------- Encounter Discharge Information Details Patient Name: Date of Service: [name], [name] 08/31/2021 8:30 A M Medical Record Number: [id number] Patient Account Number: [id number] Date of Birth/Sex:  Treating RN: 1941/01/31 (80 y.o. [name] Primary Care Hristopher Missildine: [name] Other Clinician: Referring Perkins Molina: Treating Marc Leichter/[name]: Tressie Stalker in Treatment: 11 Encounter Discharge Information Items Discharge Condition: Stable Ambulatory Status: Cane Discharge Destination: Home Transportation: Private Auto Accompanied By: self Schedule Follow-up Appointment: Yes Clinical Summary of Care: Patient Declined Electronic Signature(s) Signed: 08/31/2021 4:26:43 PM By: Baruch Gouty RN, BSN Entered By: Baruch Gouty on 08/31/2021 09:06:57 -------------------------------------------------------------------------------- Patient/Caregiver Education Details Patient Name: Date of Service: [name], [name] 12/8/2022andnbsp8:30 A M Medical Record Number: [id number] Patient Account Number: [id number] Date of Birth/Gender: Treating RN: [birth year] (80 y.o. [name] Primary Care Physician: [name] Other Clinician: Referring Physician: Treating Physician/[name]: Tressie Stalker in Treatment: 11 Education Assessment Education Provided To: Patient Education Topics Provided Offloading: Methods: Explain/Verbal Responses: Reinforcements needed, State content correctly Wound/Skin Impairment: Methods: Explain/Verbal Responses: Reinforcements needed, State content correctly Electronic Signature(s) Signed: 08/31/2021 4:26:43 PM By: Baruch Gouty RN, BSN Entered By: Baruch Gouty on 08/31/2021 09:06:41 -------------------------------------------------------------------------------- Wound Assessment Details Patient Name: Date of Service: [name], [place]. 08/31/2021 8:30 A M Medical Record Number: [id number] Patient Account Number: [id number] Date of Birth/Sex: Treating RN: Oct 16, 1940 (80 y.o. 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 Secondary Open Surgical Wound Wounding Event: Surgical Injury Etiology: Date Acquired: 05/11/2021 Wound Open Weeks Of Treatment: 11 Status: Clustered Wound: No Comorbid Coronary Artery Disease, Deep Vein Thrombosis, Hypertension, History: Myocardial Infarction, Peripheral Arterial Disease, Type II Diabetes Wound Measurements Length: (cm) 4.3 Width: (cm) 3.9 Depth: (cm) 0.5 Area: (cm) 13.171 Volume: (cm) 6.586 % Reduction in Area: 65.6% % Reduction  in Volume: 80.9% Epithelialization: Medium (34-66%) Tunneling: No Undermining: No Wound Description Classification: Grade 2 Wound Margin: Distinct, outline attached Exudate Amount: Medium Exudate Type: Serosanguineous Exudate Color: red, brown Foul Odor After Cleansing: No Slough/Fibrino Yes Wound Bed Granulation Amount: Large (67-100%) Exposed Structure Granulation Quality: Red Fascia Exposed: No Necrotic Amount: None Present (0%) Fat Layer (Subcutaneous Tissue) Exposed: Yes Tendon Exposed: No Muscle Exposed: No Joint Exposed: No Bone Exposed: No Treatment Notes Wound #13 (Amputation Site - Transmetatarsal) Wound Laterality: Left Cleanser Peri-Wound Care Topical Primary Dressing Hydrofera Blue Ready Foam, 2.5 x2.5 in Discharge Instruction: Apply to wound bed as instructed Secondary Dressing Woven Gauze Sponge, Non-Sterile 4x4 in Discharge Instruction: Apply over primary dressing as directed. ABD Pad, 5x9 Discharge Instruction: Apply over primary dressing as directed. Secured With [employer], 4.5x3.1 (in/yd) Discharge Instruction: Secure with Kerlix as directed. Paper Tape, 2x10 (in/yd) Discharge Instruction: Secure dressing with tape as directed. Compression  Wrap Compression Stockings Add-Ons Electronic Signature(s) Signed: 08/31/2021 4:26:43 PM By: Baruch Gouty RN, BSN Entered By: Baruch Gouty on 08/31/2021 09:00:09 -------------------------------------------------------------------------------- Wound Assessment Details Patient Name: Date of Service: [name], [name] 08/31/2021 8:30 A M Medical Record Number: [id number] Patient Account Number: [id number] Date of Birth/Sex: Treating RN: 09-01-1941 (80 y.o. [name] Primary Care Bradyn Vassey: [name] Other Clinician: Referring Lenox Ladouceur: Treating Toneisha Savary/[name]: Tressie Stalker in Treatment: 11 Wound Status Wound Number: 14 Primary Diabetic Wound/Ulcer of the Lower Extremity Etiology: Wound Location: Left, Posterior Calcaneus Wound Open Wounding Event: Pressure Injury Status: Date Acquired: 05/11/2021 Comorbid Coronary Artery Disease, Deep Vein Thrombosis, Hypertension, Weeks Of Treatment: 11 History: Myocardial Infarction, Peripheral Arterial Disease, Type II Diabetes Clustered Wound: No Wound Measurements Length: (cm) 0.6 Width: (cm) 1.4 Depth: (cm) 0.2 Area: (cm) 0.66 Volume: (cm) 0.132 % Reduction in Area: 52.2% % Reduction in Volume: 4.3% Epithelialization: Small (1-33%) Tunneling: No Undermining: No Wound Description Classification: Grade 2 Wound Margin: Flat and Intact Exudate Amount: Medium Exudate Type: Serous Exudate Color: amber Foul Odor After Cleansing: No Slough/Fibrino Yes Wound Bed Granulation Amount: Large (67-100%) Exposed Structure Granulation Quality: Red Fascia Exposed: No Necrotic Amount: None Present (0%) Fat Layer (Subcutaneous Tissue) Exposed: Yes Tendon Exposed: No Muscle Exposed: No Joint Exposed: No Bone Exposed: No Treatment Notes Wound #14 (Calcaneus) Wound Laterality: Left, Posterior Cleanser Peri-Wound Care Topical Primary Dressing Hydrofera Blue Ready Foam, 2.5 x2.5  in Discharge Instruction: Apply to wound bed as instructed Secondary Dressing Woven Gauze Sponge, Non-Sterile 4x4 in Discharge Instruction: Apply over primary dressing as directed. ABD Pad, 5x9 Discharge Instruction: Apply over primary dressing as directed. Secured With [employer], 4.5x3.1 (in/yd) Discharge Instruction: Secure with Kerlix as directed. Paper Tape, 2x10 (in/yd) Discharge Instruction: Secure dressing with tape as directed. Compression Wrap Compression Stockings Add-Ons Electronic Signature(s) Signed: 08/31/2021 4:26:43 PM By: Baruch Gouty RN, BSN Entered By: Baruch Gouty on 08/31/2021 09:00:28 -------------------------------------------------------------------------------- Vitals Details Patient Name: Date of Service: [name], [place]. 08/31/2021 8:30 A M Medical Record Number: [id number] Patient Account Number: [id number] Date of Birth/Sex: Treating RN: 02/12/1941 (80 y.o. [name] Primary Care Lashala Laser: [name] Other Clinician: Referring Jamarques Pinedo: Treating Gavyn Ybarra/[name]: Tressie Stalker in Treatment: 11 Vital Signs Time Taken: 08:41 Temperature (F): 97.6 Height (in): 64 Pulse (bpm): 79 Weight (lbs): 135 Respiratory Rate (breaths/min): 18 Body Mass Index (BMI): 23.2 Blood Pressure (mmHg): 137/62 Reference Range: 80 - 120 mg / dl Notes has not checked blood sugar in 5 days, waiting on a  new meter Electronic Signature(s) Signed: 08/31/2021 4:26:43 PM By: Baruch Gouty RN, BSN Entered By: Baruch Gouty on 08/31/2021 08:42:15

## 2021-08-31 NOTE — Progress Notes (Signed)
[name], [name] ([id number]) Visit Report for 08/31/2021 Physician Orders Details Patient Name: Date of Service: [name] 08/31/2021 8:30 A M Medical Record Number: [id number] Patient Account Number: [id number] Date of Birth/Sex: Treating RN: [birth year] (80 y.o. [name] Primary Care Provider: [name] Other Clinician: Referring Provider: Treating Provider/[name]: Tressie Stalker in Treatment: 11 Verbal / Phone Orders: Yes Clinician: Baruch Gouty Read Back and Verified: Yes Diagnosis Coding Follow-up Appointments ppointment in: - Wed 12/14 with Dr. Dellia Nims for apligraf application Return [facility] wound care orders this week; continue Home Health for wound care. May utilize formulary equivalent dressing for wound treatment orders unless otherwise specified. - [facility] to change dressing with hydrofera blue on Monday 12/12 Wound Treatment Wound #13 - Amputation Site - Transmetatarsal Wound Laterality: Left Prim Dressing: Hydrofera Blue Ready Foam, 2.5 x2.5 in 2 x Per Week/15 Days ary Discharge Instructions: Apply to wound bed as instructed Secondary Dressing: Woven Gauze Sponge, Non-Sterile 4x4 in 2 x Per Week/15 Days Discharge Instructions: Apply over primary dressing as directed. Secondary Dressing: ABD Pad, 5x9 2 x Per Week/15 Days Discharge Instructions: Apply over primary dressing as directed. Secured With: [employer], 4.5x3.1 (in/yd) 2 x Per Week/15 Days Discharge Instructions: Secure with Kerlix as directed. Secured With: Paper Tape, 2x10 (in/yd) 2 x Per Week/15 Days Discharge Instructions: Secure dressing with tape as directed. Wound #14 - Calcaneus Wound Laterality: Left, Posterior Prim Dressing: Hydrofera Blue Ready Foam, 2.5 x2.5 in 2 x Per Week/15 Days ary Discharge Instructions: Apply to wound bed as instructed Secondary Dressing: Woven Gauze Sponge, Non-Sterile 4x4 in 2 x Per Week/15  Days Discharge Instructions: Apply over primary dressing as directed. Secondary Dressing: ABD Pad, 5x9 2 x Per Week/15 Days Discharge Instructions: Apply over primary dressing as directed. Secured With: [employer], 4.5x3.1 (in/yd) 2 x Per Week/15 Days Discharge Instructions: Secure with Kerlix as directed. Secured With: Paper Tape, 2x10 (in/yd) 2 x Per Week/15 Days Discharge Instructions: Secure dressing with tape as directed. Electronic Signature(s) Signed: 08/31/2021 4:05:05 PM By: [name] MD Signed: 08/31/2021 4:26:43 PM By: Baruch Gouty RN, BSN Entered By: Baruch Gouty on 08/31/2021 09:03:24 -------------------------------------------------------------------------------- [place] Details Patient Name: Date of Service: [name], [name]. 08/31/2021 Medical Record Number: [id number] Patient Account Number: [id number] Date of Birth/Sex: Treating RN: 25-Jun-1941 (80 y.o. 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

## 2021-09-04 DIAGNOSIS — L89620 Pressure ulcer of left heel, unstageable: Secondary | ICD-10-CM | POA: Diagnosis not present

## 2021-09-04 DIAGNOSIS — D638 Anemia in other chronic diseases classified elsewhere: Secondary | ICD-10-CM | POA: Diagnosis not present

## 2021-09-04 DIAGNOSIS — Z4801 Encounter for change or removal of surgical wound dressing: Secondary | ICD-10-CM | POA: Diagnosis not present

## 2021-09-04 DIAGNOSIS — M86172 Other acute osteomyelitis, left ankle and foot: Secondary | ICD-10-CM | POA: Diagnosis not present

## 2021-09-04 DIAGNOSIS — Z4781 Encounter for orthopedic aftercare following surgical amputation: Secondary | ICD-10-CM | POA: Diagnosis not present

## 2021-09-04 DIAGNOSIS — I70202 Unspecified atherosclerosis of native arteries of extremities, left leg: Secondary | ICD-10-CM | POA: Diagnosis not present

## 2021-09-04 NOTE — Progress Notes (Signed)
[facility] [place] PROGRESS NOTE  ASSESSMENT / PLAN: [name] is a 80 y.o. 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 Maintained on Xarelto 2.5mg  PO QD / ASA. Her left open TMA is healing nicely. The bypass appears patent on exam today, but is threatened based on velocity measurements. Her ABI has dropped. Will plan for angiogram Thursday afternoon with Dr. Carlis Abbott.   SUBJECTIVE: Returns to clinic for wound check.  She is doing quite well.  Her wound seems to be healing.  The wound center has been treating her with good result.    OBJECTIVE: BP 116/72 (BP Location: Left Arm, Patient Position: Sitting, Cuff Size: Normal)   Pulse 68   Temp 98.3 F (36.8 C)   Resp 20   Ht 5' 4.5" (1.638 m)   Wt 146 lb (66.2 kg)   SpO2 97%   BMI 24.67 kg/m   No acute distress TMA is healing nicely with heaped up granulation tissue Wounds about the feet appear to be healing; healthy granulation tissue at the base of the wounds.  Mild fibrinous exudate at the edges of the wounds. nearly triphasic flow in the anterior tibial artery.  CBC Latest Ref Rng & Units 05/15/2021 05/14/2021 05/13/2021  WBC 4.0 - 10.5 K/uL 12.6(H) 14.1(H) 15.1(H)  Hemoglobin 12.0 - 15.0 g/dL 8.2(L) 8.0(L) 7.8(L)  Hematocrit 36.0 - 46.0 % 26.8(L) 25.4(L) 24.8(L)  Platelets 150 - 400 K/uL 517(H) 461(H) 432(H)     CMP Latest Ref Rng & Units 06/28/2021 05/15/2021 05/13/2021  Glucose 70 - 99 mg/dL 203(H) 156(H) 134(H)  BUN 6 - 23 mg/dL 29(H) 8 11  Creatinine 0.40 - 1.20 mg/dL 1.26(H) 0.80 0.75  Sodium 135 - 145 mEq/L 139 138 137  Potassium 3.5 - 5.1 mEq/L 4.7 3.6 3.4(L)  Chloride 96 - 112 mEq/L 103 105 108  CO2 19 - 32 mEq/L 24 27 20(L)  Calcium 8.4 - 10.5 mg/dL 10.5 8.9 8.8(L)  Total Protein 6.5 - 8.1 g/dL - - -  Total Bilirubin 0.3 - 1.2 mg/dL - - -  Alkaline Phos 38 - 126 U/L - - -  AST 15 - 41 U/L - - -  ALT 0 -  44 U/L - - -    Kadin Bera N. Stanford Breed, MD [facility] Phone Number: [phone] 09/04/2021 6:55 PM

## 2021-09-05 ENCOUNTER — Encounter (HOSPITAL_BASED_OUTPATIENT_CLINIC_OR_DEPARTMENT_OTHER): Payer: Medicare Other | Admitting: Internal Medicine

## 2021-09-05 ENCOUNTER — Ambulatory Visit (INDEPENDENT_AMBULATORY_CARE_PROVIDER_SITE_OTHER): Payer: Medicare Other | Admitting: Vascular Surgery

## 2021-09-05 ENCOUNTER — Ambulatory Visit (HOSPITAL_COMMUNITY)
Admission: RE | Admit: 2021-09-05 | Discharge: 2021-09-05 | Disposition: A | Discharge status: Home / Self Care | Payer: Medicare Other | Source: Ambulatory Visit | Attending: Vascular Surgery | Admitting: Vascular Surgery

## 2021-09-05 ENCOUNTER — Other Ambulatory Visit: Payer: Self-pay

## 2021-09-05 ENCOUNTER — Encounter: Payer: Self-pay | Admitting: Vascular Surgery

## 2021-09-05 ENCOUNTER — Ambulatory Visit (INDEPENDENT_AMBULATORY_CARE_PROVIDER_SITE_OTHER)
Admission: RE | Admit: 2021-09-05 | Discharge: 2021-09-05 | Disposition: A | Discharge status: Home / Self Care | Payer: Medicare Other | Source: Ambulatory Visit | Attending: Vascular Surgery | Admitting: Vascular Surgery

## 2021-09-05 VITALS — BP 116/72 | HR 68 | Temp 98.3°F | Resp 20 | Ht 64.5 in | Wt 146.0 lb

## 2021-09-05 DIAGNOSIS — I70235 Atherosclerosis of native arteries of right leg with ulceration of other part of foot: Secondary | ICD-10-CM

## 2021-09-05 DIAGNOSIS — I739 Peripheral vascular disease, unspecified: Secondary | ICD-10-CM | POA: Diagnosis not present

## 2021-09-06 DIAGNOSIS — I70202 Unspecified atherosclerosis of native arteries of extremities, left leg: Secondary | ICD-10-CM | POA: Diagnosis not present

## 2021-09-06 DIAGNOSIS — M86172 Other acute osteomyelitis, left ankle and foot: Secondary | ICD-10-CM | POA: Diagnosis not present

## 2021-09-06 DIAGNOSIS — Z4801 Encounter for change or removal of surgical wound dressing: Secondary | ICD-10-CM | POA: Diagnosis not present

## 2021-09-06 DIAGNOSIS — D638 Anemia in other chronic diseases classified elsewhere: Secondary | ICD-10-CM | POA: Diagnosis not present

## 2021-09-06 DIAGNOSIS — Z4781 Encounter for orthopedic aftercare following surgical amputation: Secondary | ICD-10-CM | POA: Diagnosis not present

## 2021-09-06 DIAGNOSIS — L89620 Pressure ulcer of left heel, unstageable: Secondary | ICD-10-CM | POA: Diagnosis not present

## 2021-09-07 ENCOUNTER — Encounter (HOSPITAL_BASED_OUTPATIENT_CLINIC_OR_DEPARTMENT_OTHER): Payer: Medicare Other | Admitting: Internal Medicine

## 2021-09-07 ENCOUNTER — Ambulatory Visit (HOSPITAL_COMMUNITY)
Admission: RE | Admit: 2021-09-07 | Discharge: 2021-09-07 | Disposition: A | Discharge status: Home / Self Care | Payer: Medicare Other | Attending: Vascular Surgery | Admitting: Vascular Surgery

## 2021-09-07 ENCOUNTER — Encounter (HOSPITAL_COMMUNITY)
Admission: RE | Disposition: A | Discharge status: Home / Self Care | Payer: Self-pay | Source: Home / Self Care | Attending: Vascular Surgery

## 2021-09-07 DIAGNOSIS — I70212 Atherosclerosis of native arteries of extremities with intermittent claudication, left leg: Secondary | ICD-10-CM | POA: Diagnosis not present

## 2021-09-07 DIAGNOSIS — Z7901 Long term (current) use of anticoagulants: Secondary | ICD-10-CM | POA: Diagnosis not present

## 2021-09-07 DIAGNOSIS — Z7982 Long term (current) use of aspirin: Secondary | ICD-10-CM | POA: Insufficient documentation

## 2021-09-07 DIAGNOSIS — Z89432 Acquired absence of left foot: Secondary | ICD-10-CM | POA: Insufficient documentation

## 2021-09-07 DIAGNOSIS — T82858A Stenosis of vascular prosthetic devices, implants and grafts, initial encounter: Secondary | ICD-10-CM | POA: Diagnosis not present

## 2021-09-07 HISTORY — PX: PERIPHERAL VASCULAR BALLOON ANGIOPLASTY: CATH118281

## 2021-09-07 HISTORY — PX: LOWER EXTREMITY ANGIOGRAPHY: CATH118251

## 2021-09-07 LAB — POCT I-STAT, CHEM 8
BUN: 28 mg/dL — ABNORMAL HIGH (ref 8–23)
Calcium, Ion: 1.3 mmol/L (ref 1.15–1.40)
Chloride: 102 mmol/L (ref 98–111)
Creatinine, Ser: 1.2 mg/dL — ABNORMAL HIGH (ref 0.44–1.00)
Glucose, Bld: 164 mg/dL — ABNORMAL HIGH (ref 70–99)
HCT: 31 % — ABNORMAL LOW (ref 36.0–46.0)
Hemoglobin: 10.5 g/dL — ABNORMAL LOW (ref 12.0–15.0)
Potassium: 4.2 mmol/L (ref 3.5–5.1)
Sodium: 139 mmol/L (ref 135–145)
TCO2: 29 mmol/L (ref 22–32)

## 2021-09-07 LAB — GLUCOSE, CAPILLARY: Glucose-Capillary: 148 mg/dL — ABNORMAL HIGH (ref 70–99)

## 2021-09-07 LAB — POCT ACTIVATED CLOTTING TIME: Activated Clotting Time: 221 seconds

## 2021-09-07 SURGERY — LOWER EXTREMITY ANGIOGRAPHY
Anesthesia: LOCAL

## 2021-09-07 MED ORDER — SODIUM CHLORIDE 0.9% FLUSH: 3.0000 mL | INTRAVENOUS | Status: DC | PRN

## 2021-09-07 MED ORDER — SODIUM CHLORIDE 0.9 % IV SOLN: 250.0000 mL | INTRAVENOUS | Status: DC | PRN

## 2021-09-07 MED ORDER — ONDANSETRON HCL 4 MG/2ML IJ SOLN: 4.0000 mg | Freq: Four times a day (QID) | INTRAMUSCULAR | Status: DC | PRN

## 2021-09-07 MED ORDER — IODIXANOL 320 MG/ML IV SOLN
INTRAVENOUS | Status: DC | PRN
  Administered 2021-09-07: 110 mL

## 2021-09-07 MED ORDER — HEPARIN (PORCINE) IN NACL 2000-0.9 UNIT/L-% IV SOLN
INTRAVENOUS | Status: DC | PRN
  Administered 2021-09-07: 1000 mL

## 2021-09-07 MED ORDER — SODIUM CHLORIDE 0.9% FLUSH: 3.0000 mL | Freq: Two times a day (BID) | INTRAVENOUS | Status: DC

## 2021-09-07 MED ORDER — HEPARIN SODIUM (PORCINE) 1000 UNIT/ML IJ SOLN
INTRAMUSCULAR | Status: AC
  Filled 2021-09-07: qty 10

## 2021-09-07 MED ORDER — SODIUM CHLORIDE 0.9 % IV SOLN: INTRAVENOUS | Status: AC

## 2021-09-07 MED ORDER — HEPARIN (PORCINE) IN NACL 2000-0.9 UNIT/L-% IV SOLN
INTRAVENOUS | Status: AC
  Filled 2021-09-07: qty 1000

## 2021-09-07 MED ORDER — HYDRALAZINE HCL 20 MG/ML IJ SOLN: 5.0000 mg | INTRAMUSCULAR | Status: DC | PRN

## 2021-09-07 MED ORDER — FENTANYL CITRATE (PF) 100 MCG/2ML IJ SOLN
INTRAMUSCULAR | Status: DC | PRN
  Administered 2021-09-07: 25 ug via INTRAVENOUS

## 2021-09-07 MED ORDER — LIDOCAINE HCL (PF) 1 % IJ SOLN
INTRAMUSCULAR | Status: DC | PRN
  Administered 2021-09-07: 12 mL

## 2021-09-07 MED ORDER — LABETALOL HCL 5 MG/ML IV SOLN: 10.0000 mg | INTRAVENOUS | Status: DC | PRN

## 2021-09-07 MED ORDER — LIDOCAINE HCL (PF) 1 % IJ SOLN
INTRAMUSCULAR | Status: AC
  Filled 2021-09-07: qty 30

## 2021-09-07 MED ORDER — ACETAMINOPHEN 325 MG PO TABS: 650.0000 mg | ORAL_TABLET | ORAL | Status: DC | PRN

## 2021-09-07 MED ORDER — HEPARIN SODIUM (PORCINE) 1000 UNIT/ML IJ SOLN
INTRAMUSCULAR | Status: DC | PRN
  Administered 2021-09-07: 7000 [IU] via INTRAVENOUS

## 2021-09-07 MED ORDER — SODIUM CHLORIDE 0.9 % IV SOLN: INTRAVENOUS | Status: DC

## 2021-09-07 MED ORDER — FENTANYL CITRATE (PF) 100 MCG/2ML IJ SOLN
INTRAMUSCULAR | Status: AC
  Filled 2021-09-07: qty 2

## 2021-09-07 SURGICAL SUPPLY — 21 items
BALLN STERLING OTW 3X40X150 (BALLOONS) ×3
BALLOON STERLING OTW 3X40X150 (BALLOONS) IMPLANT
CATH OMNI FLUSH 5F 65CM (CATHETERS) ×1 IMPLANT
CATH QUICKCROSS .018X135CM (MICROCATHETER) ×1 IMPLANT
CATH SOFT-VU 4F 65 STRAIGHT (CATHETERS) IMPLANT
CATH SOFT-VU STRAIGHT 4F 65CM (CATHETERS) ×3
GLIDEWIRE ANGLED NITR .018X260 (WIRE) ×1 IMPLANT
GUIDEWIRE ANGLED .035X150CM (WIRE) ×1 IMPLANT
KIT ENCORE 26 ADVANTAGE (KITS) ×1 IMPLANT
KIT MICROPUNCTURE NIT STIFF (SHEATH) ×1 IMPLANT
KIT PV (KITS) ×3 IMPLANT
SHEATH FLEX ANSEL ANG 5F 45CM (SHEATH) ×1 IMPLANT
SHEATH PINNACLE 5F 10CM (SHEATH) ×2 IMPLANT
SHEATH PROBE COVER 6X72 (BAG) ×1 IMPLANT
SYR MEDRAD MARK V 150ML (SYRINGE) ×1 IMPLANT
TRANSDUCER W/STOPCOCK (MISCELLANEOUS) ×3 IMPLANT
TRAY PV CATH (CUSTOM PROCEDURE TRAY) ×3 IMPLANT
WIRE BENTSON .035X145CM (WIRE) ×1 IMPLANT
WIRE G V18X300CM (WIRE) ×1 IMPLANT
WIRE ROSEN-J .035X260CM (WIRE) ×1 IMPLANT
WIRE TORQFLEX AUST .018X40CM (WIRE) ×1 IMPLANT

## 2021-09-07 NOTE — Progress Notes (Signed)
Vitals monitored and stable prior to sheath pull. Sheath removed at 14:32. Level 0. No hematoma present. Bedrest starting 14:55. Pt denies complaints. Pulse present with doppler L DP. Pt tx to short stay with belongings. Jerald Kief, RN

## 2021-09-07 NOTE — H&P (Signed)
History and Physical Interval Note:  09/07/2021 10:27 AM  [name]  has presented today for surgery, with the diagnosis of pad.  The various methods of treatment have been discussed with the patient and family. After consideration of risks, benefits and other options for treatment, the patient has consented to  Procedure(s): LOWER EXTREMITY ANGIOGRAPHY (N/A) as a surgical intervention.  The patient's history has been reviewed, patient examined, no change in status, stable for surgery.  I have reviewed the patient's chart and labs.  Questions were answered to the patient's satisfaction.     [name]  [facility] [place] PROGRESS NOTE   ASSESSMENT / PLAN: [name] is a 80 y.o. 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 Maintained on Xarelto 2.5mg  PO QD / ASA. Her left open TMA is healing nicely. The bypass appears patent on exam today, but is threatened based on velocity measurements. Her ABI has dropped. Will plan for angiogram Thursday afternoon with Dr. Carlis Abbott.    SUBJECTIVE: Returns to clinic for wound check.  She is doing quite well.  Her wound seems to be healing.  The wound center has been treating her with good result.     OBJECTIVE: BP 116/72 (BP Location: Left Arm, Patient Position: Sitting, Cuff Size: Normal)    Pulse 68    Temp 98.3 F (36.8 C)    Resp 20    Ht 5' 4.5" (1.638 m)    Wt 146 lb (66.2 kg)    SpO2 97%    BMI 24.67 kg/m    No acute distress TMA is healing nicely with heaped up granulation tissue Wounds about the feet appear to be healing; healthy granulation tissue at the base of the wounds.  Mild fibrinous exudate at the edges of the wounds. nearly triphasic flow in the anterior tibial artery.   CBC Latest Ref Rng & Units 05/15/2021 05/14/2021 05/13/2021  WBC 4.0 - 10.5 K/uL 12.6(H) 14.1(H) 15.1(H)  Hemoglobin 12.0 - 15.0 g/dL 8.2(L)  8.0(L) 7.8(L)  Hematocrit 36.0 - 46.0 % 26.8(L) 25.4(L) 24.8(L)  Platelets 150 - 400 K/uL 517(H) 461(H) 432(H)      CMP Latest Ref Rng & Units 06/28/2021 05/15/2021 05/13/2021  Glucose 70 - 99 mg/dL 203(H) 156(H) 134(H)  BUN 6 - 23 mg/dL 29(H) 8 11  Creatinine 0.40 - 1.20 mg/dL 1.26(H) 0.80 0.75  Sodium 135 - 145 mEq/L 139 138 137  Potassium 3.5 - 5.1 mEq/L 4.7 3.6 3.4(L)  Chloride 96 - 112 mEq/L 103 105 108  CO2 19 - 32 mEq/L 24 27 20(L)  Calcium 8.4 - 10.5 mg/dL 10.5 8.9 8.8(L)  Total Protein 6.5 - 8.1 g/dL - - -  Total Bilirubin 0.3 - 1.2 mg/dL - - -  Alkaline Phos 38 - 126 U/L - - -  AST 15 - 41 U/L - - -  ALT 0 - 44 U/L - - -      Thomas N. Stanford Breed, MD [facility] Phone Number: [phone] 09/04/2021 6:55 PM

## 2021-09-07 NOTE — Op Note (Signed)
Patient name: [name] MRN: [id number] DOB: 02-24-1941 Sex: female  09/07/2021 Pre-operative Diagnosis: Threatened left lower extremity bypass Post-operative diagnosis:  Same Surgeon:  Marty Heck, MD Procedure Performed: 1.  Ultrasound-guided access right common femoral artery 2.  Aortogram with catheter selection of aorta 3.  Left lower extremity arteriogram with selection of third order branches  4.  Angioplasty of distal bypass anastomosis and anterior tibial artery (3 mm Sterling) 5.  Right femoral angiogram  Indications: 80 year old female that underwent a left lower extremity bypass by Dr. Stanford Breed with PTFE on 05/03/21 from the left common femoral artery to the TP trunk and was recently seen in the office for surveillance and had sluggish velocities in the bypass.  She presents today for intervention to try and maintain patency of the bypass after risk and benefits discussed.  Findings: Aortogram showed no flow-limiting stenosis in the aortoiliac segment on the left which is the side of interest.  She does have some calcification in the left common iliac artery but this is not flow-limiting.  The left common femoral and profunda are patent.  Left lower extremity bypass had pretty brisk flow with no significant proximal anastomotic stenosis on the common femoral artery.  Distally there was a high-grade stenosis where the bypass anastomosis was performed with dominant outflow down the anterior tibial artery.  I was able to get a wire down the anterior tibial and the distal bypass as well as the anterior tibial was angioplastied with 3 mm Sterling with good results.     Procedure:  The patient was identified in the holding area and taken to room 8.  The patient was then placed supine on the table and prepped and draped in the usual sterile fashion.  A time out was called.  Ultrasound was used to evaluate the right common femoral artery.  It was patent .  A digital ultrasound  image was acquired.  A micropuncture needle was used to access the right common femoral artery under ultrasound guidance.  An 018 wire was advanced without resistance and a micropuncture sheath was placed.  The 018 wire was removed and a benson wire was placed.  The micropuncture sheath was exchanged for a 5 french sheath.  An omniflush catheter was advanced over the wire to the level of L-1.  An abdominal angiogram was obtained.  Next, using the omniflush catheter and a benson wire, the aortic bifurcation was crossed and the catheter was placed into the left external iliac artery and left runoff was obtained.  After evaluating images we elected to intervene on the distal anastomosis at the takeoff of the anterior tibial where there was a high-grade stenosis.  We were able to get a wire down the bypass with a Constance Holster for support and placed a long 5 [place] Ansell sheath in the right groin over the aortic bifurcation.  Patient was given 100 units/kg IV heparin.  We then used a V 18 with a quick cross catheter to get down the bypass and tried to cross the high-grade stenosis at the takeoff of the anterior tibial which appear to be the dominant outflow of the bypass.  Ultimately had to use a 018 Glidewire in order to cross this stenosis and then we exchanged for a V 18 for more support.  The distal bypass stenosis and proximal anterior tibial artery was then angioplastied with a 3 mm Sterling to nominal pressure for 2 minutes with excellent results.  Preserved runoff in the anterior tibial  into the foot.  Wires and catheters removed.  A short 5 French sheath was placed in the right groin.  We did get dedicated imaging on the right that showed a diseased common femoral elected not to close the patient.  She was taken to holding to have the sheath removed.   Plan: Patient can continue aspirin and statin.  I will have her restart her Xarelto tomorrow.  We will arrange follow-up in 1 month with left leg arterial duplex and  ABIs for continued surveillance  Marty Heck, MD [facility] Office: [place]

## 2021-09-08 ENCOUNTER — Encounter (HOSPITAL_COMMUNITY): Payer: Self-pay | Admitting: Vascular Surgery

## 2021-09-08 DIAGNOSIS — D638 Anemia in other chronic diseases classified elsewhere: Secondary | ICD-10-CM | POA: Diagnosis not present

## 2021-09-08 DIAGNOSIS — L89620 Pressure ulcer of left heel, unstageable: Secondary | ICD-10-CM | POA: Diagnosis not present

## 2021-09-08 DIAGNOSIS — Z4801 Encounter for change or removal of surgical wound dressing: Secondary | ICD-10-CM | POA: Diagnosis not present

## 2021-09-08 DIAGNOSIS — I70202 Unspecified atherosclerosis of native arteries of extremities, left leg: Secondary | ICD-10-CM | POA: Diagnosis not present

## 2021-09-08 DIAGNOSIS — Z4781 Encounter for orthopedic aftercare following surgical amputation: Secondary | ICD-10-CM | POA: Diagnosis not present

## 2021-09-08 DIAGNOSIS — M86172 Other acute osteomyelitis, left ankle and foot: Secondary | ICD-10-CM | POA: Diagnosis not present

## 2021-09-08 LAB — POCT ACTIVATED CLOTTING TIME
Activated Clotting Time: 191 seconds
Activated Clotting Time: 161 seconds

## 2021-09-11 DIAGNOSIS — I70202 Unspecified atherosclerosis of native arteries of extremities, left leg: Secondary | ICD-10-CM | POA: Diagnosis not present

## 2021-09-11 DIAGNOSIS — D638 Anemia in other chronic diseases classified elsewhere: Secondary | ICD-10-CM | POA: Diagnosis not present

## 2021-09-11 DIAGNOSIS — Z4781 Encounter for orthopedic aftercare following surgical amputation: Secondary | ICD-10-CM | POA: Diagnosis not present

## 2021-09-11 DIAGNOSIS — M86172 Other acute osteomyelitis, left ankle and foot: Secondary | ICD-10-CM | POA: Diagnosis not present

## 2021-09-11 DIAGNOSIS — L89620 Pressure ulcer of left heel, unstageable: Secondary | ICD-10-CM | POA: Diagnosis not present

## 2021-09-11 DIAGNOSIS — Z4801 Encounter for change or removal of surgical wound dressing: Secondary | ICD-10-CM | POA: Diagnosis not present

## 2021-09-12 ENCOUNTER — Other Ambulatory Visit: Payer: Self-pay

## 2021-09-12 ENCOUNTER — Encounter (HOSPITAL_BASED_OUTPATIENT_CLINIC_OR_DEPARTMENT_OTHER): Payer: Medicare Other | Admitting: Internal Medicine

## 2021-09-12 DIAGNOSIS — E11621 Type 2 diabetes mellitus with foot ulcer: Secondary | ICD-10-CM | POA: Diagnosis not present

## 2021-09-12 DIAGNOSIS — L97428 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: Secondary | ICD-10-CM | POA: Diagnosis not present

## 2021-09-12 DIAGNOSIS — T8131XD Disruption of external operation (surgical) wound, not elsewhere classified, subsequent encounter: Secondary | ICD-10-CM | POA: Diagnosis not present

## 2021-09-12 DIAGNOSIS — L97514 Non-pressure chronic ulcer of other part of right foot with necrosis of bone: Secondary | ICD-10-CM | POA: Diagnosis not present

## 2021-09-12 DIAGNOSIS — L97522 Non-pressure chronic ulcer of other part of left foot with fat layer exposed: Secondary | ICD-10-CM | POA: Diagnosis not present

## 2021-09-13 DIAGNOSIS — U071 COVID-19: Secondary | ICD-10-CM | POA: Diagnosis not present

## 2021-09-13 DIAGNOSIS — D638 Anemia in other chronic diseases classified elsewhere: Secondary | ICD-10-CM | POA: Diagnosis not present

## 2021-09-13 DIAGNOSIS — L89620 Pressure ulcer of left heel, unstageable: Secondary | ICD-10-CM | POA: Diagnosis not present

## 2021-09-13 DIAGNOSIS — Z89422 Acquired absence of other left toe(s): Secondary | ICD-10-CM | POA: Diagnosis not present

## 2021-09-13 DIAGNOSIS — K219 Gastro-esophageal reflux disease without esophagitis: Secondary | ICD-10-CM | POA: Diagnosis not present

## 2021-09-13 DIAGNOSIS — E785 Hyperlipidemia, unspecified: Secondary | ICD-10-CM | POA: Diagnosis not present

## 2021-09-13 DIAGNOSIS — Z89412 Acquired absence of left great toe: Secondary | ICD-10-CM | POA: Diagnosis not present

## 2021-09-13 DIAGNOSIS — M6281 Muscle weakness (generalized): Secondary | ICD-10-CM | POA: Diagnosis not present

## 2021-09-13 DIAGNOSIS — H409 Unspecified glaucoma: Secondary | ICD-10-CM | POA: Diagnosis not present

## 2021-09-13 DIAGNOSIS — Z7984 Long term (current) use of oral hypoglycemic drugs: Secondary | ICD-10-CM | POA: Diagnosis not present

## 2021-09-13 DIAGNOSIS — E11628 Type 2 diabetes mellitus with other skin complications: Secondary | ICD-10-CM | POA: Diagnosis not present

## 2021-09-13 DIAGNOSIS — I70202 Unspecified atherosclerosis of native arteries of extremities, left leg: Secondary | ICD-10-CM | POA: Diagnosis not present

## 2021-09-13 DIAGNOSIS — Z4781 Encounter for orthopedic aftercare following surgical amputation: Secondary | ICD-10-CM | POA: Diagnosis not present

## 2021-09-13 DIAGNOSIS — I10 Essential (primary) hypertension: Secondary | ICD-10-CM | POA: Diagnosis not present

## 2021-09-13 DIAGNOSIS — Z7982 Long term (current) use of aspirin: Secondary | ICD-10-CM | POA: Diagnosis not present

## 2021-09-13 DIAGNOSIS — R262 Difficulty in walking, not elsewhere classified: Secondary | ICD-10-CM | POA: Diagnosis not present

## 2021-09-13 DIAGNOSIS — Z7901 Long term (current) use of anticoagulants: Secondary | ICD-10-CM | POA: Diagnosis not present

## 2021-09-14 DIAGNOSIS — D2262 Melanocytic nevi of left upper limb, including shoulder: Secondary | ICD-10-CM | POA: Diagnosis not present

## 2021-09-14 DIAGNOSIS — Z4781 Encounter for orthopedic aftercare following surgical amputation: Secondary | ICD-10-CM | POA: Diagnosis not present

## 2021-09-14 DIAGNOSIS — D2239 Melanocytic nevi of other parts of face: Secondary | ICD-10-CM | POA: Diagnosis not present

## 2021-09-14 DIAGNOSIS — I10 Essential (primary) hypertension: Secondary | ICD-10-CM | POA: Diagnosis not present

## 2021-09-14 DIAGNOSIS — I70202 Unspecified atherosclerosis of native arteries of extremities, left leg: Secondary | ICD-10-CM | POA: Diagnosis not present

## 2021-09-14 DIAGNOSIS — D2261 Melanocytic nevi of right upper limb, including shoulder: Secondary | ICD-10-CM | POA: Diagnosis not present

## 2021-09-14 DIAGNOSIS — D692 Other nonthrombocytopenic purpura: Secondary | ICD-10-CM | POA: Diagnosis not present

## 2021-09-14 DIAGNOSIS — D638 Anemia in other chronic diseases classified elsewhere: Secondary | ICD-10-CM | POA: Diagnosis not present

## 2021-09-14 DIAGNOSIS — D2271 Melanocytic nevi of right lower limb, including hip: Secondary | ICD-10-CM | POA: Diagnosis not present

## 2021-09-14 DIAGNOSIS — D1801 Hemangioma of skin and subcutaneous tissue: Secondary | ICD-10-CM | POA: Diagnosis not present

## 2021-09-14 DIAGNOSIS — E11628 Type 2 diabetes mellitus with other skin complications: Secondary | ICD-10-CM | POA: Diagnosis not present

## 2021-09-14 DIAGNOSIS — D225 Melanocytic nevi of trunk: Secondary | ICD-10-CM | POA: Diagnosis not present

## 2021-09-14 DIAGNOSIS — D2272 Melanocytic nevi of left lower limb, including hip: Secondary | ICD-10-CM | POA: Diagnosis not present

## 2021-09-14 DIAGNOSIS — Z85828 Personal history of other malignant neoplasm of skin: Secondary | ICD-10-CM | POA: Diagnosis not present

## 2021-09-14 DIAGNOSIS — L89620 Pressure ulcer of left heel, unstageable: Secondary | ICD-10-CM | POA: Diagnosis not present

## 2021-09-14 DIAGNOSIS — D224 Melanocytic nevi of scalp and neck: Secondary | ICD-10-CM | POA: Diagnosis not present

## 2021-09-17 ENCOUNTER — Other Ambulatory Visit: Payer: Self-pay | Admitting: Vascular Surgery

## 2021-09-17 ENCOUNTER — Other Ambulatory Visit: Payer: Self-pay | Admitting: Endocrinology

## 2021-09-20 ENCOUNTER — Encounter (HOSPITAL_BASED_OUTPATIENT_CLINIC_OR_DEPARTMENT_OTHER): Payer: Medicare Other | Admitting: Internal Medicine

## 2021-09-20 ENCOUNTER — Other Ambulatory Visit: Payer: Self-pay

## 2021-09-20 DIAGNOSIS — L97512 Non-pressure chronic ulcer of other part of right foot with fat layer exposed: Secondary | ICD-10-CM | POA: Diagnosis not present

## 2021-09-20 DIAGNOSIS — E11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: Secondary | ICD-10-CM | POA: Diagnosis not present

## 2021-09-20 DIAGNOSIS — L97428 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: Secondary | ICD-10-CM | POA: Diagnosis not present

## 2021-09-20 DIAGNOSIS — L97514 Non-pressure chronic ulcer of other part of right foot with necrosis of bone: Secondary | ICD-10-CM | POA: Diagnosis not present

## 2021-09-20 DIAGNOSIS — T8131XD Disruption of external operation (surgical) wound, not elsewhere classified, subsequent encounter: Secondary | ICD-10-CM | POA: Diagnosis not present

## 2021-09-20 DIAGNOSIS — E11621 Type 2 diabetes mellitus with foot ulcer: Secondary | ICD-10-CM | POA: Diagnosis not present

## 2021-09-20 DIAGNOSIS — T8131XA Disruption of external operation (surgical) wound, not elsewhere classified, initial encounter: Secondary | ICD-10-CM | POA: Diagnosis not present

## 2021-09-20 NOTE — Progress Notes (Signed)
[name], [name] ([id number]) Visit Report for 09/20/2021 Debridement Details Patient Name: Date of Service: [name] 09/20/2021 2:15 PM Medical Record Number: [id number] Patient Account Number: [id number] Date of Birth/Sex: Treating RN: 10-01-1940 (80 y.o. F) Primary Care Provider: [name] Other Clinician: Referring Provider: Treating Provider/[name]: Tressie Stalker in Treatment: 13 Debridement Performed for Assessment: 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 Performed By: Physician Ricard Dillon., MD Debridement Type: Debridement Severity of Tissue Pre Debridement: Fat layer exposed Level of Consciousness (Pre-procedure): Awake and Alert Pre-procedure Verification/Time Out Yes - 14:47 Taken: Start Time: 14:48 Pain Control: Other : Benzocaine T Area Debrided (L x W): otal 0.5 (cm) x 1 (cm) = 0.5 (cm) Tissue and other material debrided: Non-Viable, Bone, Subcutaneous Level: Skin/Subcutaneous Tissue/Muscle/Bone Debridement Description: Excisional Instrument: Curette Specimen: Tissue Culture Number of Specimens T aken: 1 Bleeding: Moderate Hemostasis Achieved: Silver Nitrate End Time: 14:53 Response to Treatment: Procedure was tolerated well Level of Consciousness (Post- Awake and Alert procedure): Post Debridement Measurements of Total Wound Length: (cm) 0.5 Width: (cm) 1 Depth: (cm) 0.3 Volume: (cm) 0.118 Character of Wound/Ulcer Post Debridement: Stable Severity of Tissue Post Debridement: Fat layer exposed Post Procedure Diagnosis Same as Pre-procedure Electronic Signature(s) Signed: 09/20/2021 3:46:55 PM By: [name] MD Entered By: [name] on 09/20/2021 15:04:58 -------------------------------------------------------------------------------- HPI Details Patient Name: Date of Service: [name], [name]. 09/20/2021 2:15 PM Medical Record Number: [id number] Patient Account Number:  [id number] Date of Birth/Sex: Treating RN: May 01, 1941 (81 y.o. F) Primary Care Provider: [name] Other Clinician: Referring Provider: Treating Provider/[name]: Tressie Stalker in Treatment: 13 History of Present Illness HPI Description: ADMISSION 07/13/2019 Patient is a 80 year old type II diabetic. She has known PAD. She has been followed by Dr. Jacqualyn Posey of podiatry for blistering areas on her toes dating back to 04/30/2019 which at this was the left fourth toe. By 8/11 she had wounds on the right and left second toes. She underwent arterial studies by Dr. Alvester Chou on 7/31 that showed ABIs on the right of 0.60 TBI of 0.26 on the left ABI of 0.56 and a TBI of 0.25. By 9/10 she had ischemic-looking wounds per Dr. Earleen Newport on the right first, left first second and third. She has been using Medihoney and then mupirocin more recently simply Betadine. The patient underwent angiography by Dr. Gwenlyn Found on 9/21. 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 Noted that there was 1 vessel runoff in the foot via the peroneal. On the left there was an 80% left SFA, 70% mid left SFA. There was a short segment calcified CTO distal less than SFA and above-knee popliteal artery reconstituting with two-vessel runoff. 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 It was felt that she had bilateral SFA disease as well as tibial vessel disease. An attempt was made to revascularize the left SFA but they were unable to cross because of the highly calcified nature of the lesion. 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 She has an area on the medial left fourth toe with raised horned callus on top of this. I am not certain what this represents. With regards  to pain she has about a 2-1/2 I will claudication tolerance in the grocery  store. She has some pain in night which is relieved by putting her feet down over the side of the bed. She is wearing Nitro-Dur patches on the top of her right foot. Past medical history includes type 2 diabetes with secondary PAD, neoplasm of the skin, diabetic retinopathy, carotid artery stenosis, hyperlipidemia hypertension. 11/2; the last time the patient was here I spoke to Dr. Gwenlyn Found about revascularization options on the right. As I understand things currently Dr. Gwenlyn Found spoke with Dr. Trula Slade and ultimately the patient was taken to surgery on 10/27. She had a right iliofemoral endarterectomy with a bovine patch angioplasty. I think the plan now is for her to have a staged intervention on the right SFA by Dr. Alvester Chou. Per the patient's understanding Dr. Gwenlyn Found and Dr. Jacqualyn Posey are waiting to see when the gangrenous toes on the right foot can be amputated. The patient states her pain is a lot better and she is grateful for that. 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 Small area on the left fourth toe. She is using Betadine to the mummified areas on the right and Medihoney on the left 08/27/2019; the last time I saw this patient I discharged her from the clinic. She had been revascularized by Dr. Trula Slade and she had a right iliofemoral endarterectomy with a bovine angioplasty. 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 Jacqualyn Posey of podiatry on 08/07/2019. I note that she also had a intervention by Dr. Gwenlyn Found and he performed a directional atherectomy and drug-coated balloon angioplasty of the SFA and popliteal artery on the right. I am not certain of the date of Dr. Kennon Holter procedure as of the time of this dictation. She was referred back to [place] by Dr. Earleen Newport predominantly for follow-up of the left fourth toe. She still has sutures and stitches in  the right TMA site. She states her pain is a lot better. She expresses concern about the condition of the amputation site at the TMA. She is on doxycycline I think prescribed by Dr. Earleen Newport. She is complaining of some pain at night 12/10; I spoke to Dr. Jacqualyn Posey last week. He removed the sutures on the right foot on Monday of this week. She has the area on the left fourth toe just proximal to the PIP and then the right TMA site. She is still on doxycycline and has enough through next week. Unfortunately the TMA site does not look good at all. Both on the lateral and medial part of the incisions are areas that probe to bone. There is purulence over the medial part which I have cultured. We will use silver alginate. Left fourth toe looks somewhat better but there was still exposed bone 12/17; patient has an MRI booked for 12/30. Culture I did last week showed Pseudomonas Serratia and Enterococcus. This was purulent drainage coming out of the medial part of her amputation site. I use cefdinir 300 twice daily for 10 days that started on 12/15. Her x-ray on the right showed limited evaluation for osteomyelitis. The findings could have been postoperative. There was subtle erosion in the distal first and distal fifth metatarsal. An MRI was suggested. On the left she had irregularity of the fourth middle and proximal phalanx consistent with a history of osteomyelitis. I do not know that she has a history of osteomyelitis in this area. She had a newly defined area on the plantar third toe 12/31; patient's MRI is  listed below: MRI OF THE RIGHT FOREFOOT WITHOUT AND WITH CONTRAST TECHNIQUE: Multiplanar, multisequence MR imaging of the right forefoot was performed before and after the administration of intravenous contrast. CONTRAST 6 mL GADAVIST IV SOLN : COMPARISON: Plain films right foot 09/04/2019. FINDINGS: Bones/Joint/Cartilage The patient is status post transmetatarsal amputation as seen on the prior  exam. Marrow edema and enhancement are seen in all of the distal metatarsals. In the first metatarsal, signal change extends 1.5 cm proximal to the stump and in the second metatarsal extends approximately 2 cm proximal to the stump. Edema and enhancement are seen in only the distal 0.7 cm of the third metatarsal stump and tips of the fourth and fifth metatarsals. Bone marrow signal is otherwise unremarkable. A small focus of subchondral edema is seen in the lateral talus, likely degenerative. Ligaments Intact. Muscles and Tendons No intramuscular fluid collection. Soft tissues Skin ulceration is seen off the stump of the first metatarsal. A thin fluid collection tracks deep to the wound and over the anterior metatarsals worrisome for small abscess. Intense subcutaneous edema and enhancement are seen diffusely. 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 Cellulitis about the foot. Skin ulceration over the distal first metatarsal is identified with a thin fluid collection tracking anteriorly along the stump worrisome for abscess. Small focus of subchondral edema in the lateral talus is likely degenerative. Electronically Signed By: Inge Rise M.D. On: 09/23/2019 15:25 Patient arrives in clinic today not complaining any of any pain. 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 She does not describe claudication however her activity is very limited. 10/01/2019; since the patient was last here I have communicated with Dr. Gwenlyn Found after bypass by Dr. Trula Slade and addressing the right superficial femoral artery he states that she is widely patent through peroneal arteries to the ankle with collaterals to the dorsalis pedis. He states he is going to talk to colleagues  about the feasibility of tibial pedal access. The patient seems infectious disease later this afternoon Dr. Baxter Flattery. in preparation for this she has been off antibiotics for 1 week and I went ahead and obtain pieces of the remanence of her first metatarsal for pathology and CandS. The patient is a candidate for hyperbaric oxygen. 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 1/15; considerable improvement in the wounds on her feet. We are using silver collagen. She follows with Dr. Baxter Flattery of infectious disease and is on meropenem and daptomycin. She has been taught how to do this herself at home. Follows with Dr. Graylon Good at the end of treatment here. She has 2 wounds on the surgical TMA site 1 lateral and 1 medial the lateral 1 has regressed tremendously. The area medially still has some exposed bone although the base of this looks healthy. 1/22; 2 separate wounds on the surgical TMA site. Both of these look satisfactory. The medial area does not have any exposed bone. This is an improvement On the left side fourth toe dorsally over the proximal phalanx there is a deep punched out area that probes to bone. She has an area on the tip of the left third toe. She also tells [place] that in HBO she traumatized her left Achilles and this is left her with a superficial wound area 1/28; weekly visit along with HBO. She has  5 wounds. T our punched out areas on the original TMA site on the right. Both of these appear to have contracted. o The area on the right no longer has exposed bone. She has an area on the tip of the left third toe and on the DIP of the left fourth toe. Both of these had surface callus that I removed and unfortunately they have small areas that both go to bone. She has a traumatic wound on the left Achilles area that happened in HBO and that appears better. She is tolerating her IV antibiotics well at home. She has home health changing the dressings and  she is doing it once on the weekends. We have been using silver collagen. She has been extensively revascularized on the right by Dr. Trula Slade and subsequently by Dr. Gwenlyn Found. 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 Baxter Flattery of infectious disease on 2/17. Dr. Gwenlyn Found on 2/23 and according to her her antibiotics stopped on 2/23. She is tolerating hyperbarics well. She has made a tremendous improvement in the right forefoot with only 2 smaller open wounds. Using silver collagen. On the left foot she has the area on the tip of the left third toe and the medial part of the left fourth toe. These had exposed bone last week I did not sense any of that today 2/12; sees Dr. Graylon Good on 2/17 and Dr. Gwenlyn Found on 2/23. We are going to use Dermagraft on this today however the lateral part of her train TMA incision on the right is healed and the medial part is down so much that we just continue with the silver collagen She has wounds on the tip of her left third and the medial aspect of the left fourth. Both of these still have exposed bone I have not been able to get these to epithelialize. 2/19; she sees Dr. Gwenlyn Found on 2/23 and I have communicated with him about the left vascular supply. Looking at her angiogram from 08/03/2019 it looks as though that they could not cross the left CFA. Noted that she had one-vessel runoff bilaterally. She also apparently saw Dr. Graylon Good although I did not look up these results for preparation of this record. 2/26; Dr. Gwenlyn Found is going to do an angiogram next week on Wednesday. I think they are going to try to go at this both retrograde and anterograde to see if anything can be done to revascularize the left lower limb. She continues to make nice progress on the remaining wound on the right medial foot on her TMA site and the toe it wounds on the left are  responding nicely as well 3/12; the patient underwent an extensive and complicated revascularization on the left leg by Dr. Fletcher Anon on 3/3; she had an atherectomy on occluded left popliteal artery; anterior tibial artery followed by a balloon angioplasty. Atherectomy was also performed to the left SFA because there was still significant 50% stenosis in the left popliteal artery they performed an intravascular lithotripsy which improved the residual stenosis to 20%. The same lithotripsy was used to dilate the proximal left SFA. She had a drug-eluting stent placed I believe in the SFA. The patient returned for hyperbarics this week. She has had some eye problems on the right which she tells me are secondary to diabetic retinopathy and she saw her eye doctor. She is really  made excellent progress there is no open wounds on the left foot at all. She has one open area medially and her TMA site on the right lateral wound is closed. 3/19; the patient comes in today with the area on the medial transmet on the right improved. Some debris removed from the surface revealed the still open wound. Unfortunately she now has the open area on the third toe tip and the medial aspect of the dorsal fourth toe. These are in the same location as her previous wounds. These had actually closed up last week. 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 She is undergone complex revascularizations on the right by Dr. Trula Slade and Dr. Gwenlyn Found and I believe Dr. 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 She completed IVantibiotics as directed by infectious disease and I believe a course of oral antibiotics as well.she underwent a complete  40 treatments of hyperbarics. She comes in today with the vast majority of the right TMA site healed there is a small opening on the most medial aspect. Her left third and left fourth toes appear to have epithelialization although this has fluctuated somewhat. 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 She had a gaping wound on the right transmetatarsal amputation L as well as deep wounds on the left third and left fourth toes. She underwent complex revascularizations on her bilateral lower legs which I described on her last visit. She completed IV antibiotics by infectious disease and 40 treatments of hyperbarics. Her left third and fourth toes are healed. The area on the right transmetatarsal amputation site is also closed today. READMISSION 08/04/2020 [name] is now a 80 year old woman that we had for a complex stay in our clinic from October 2000 14 January 2020. 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 Her current problems with wounds began on October 30 when she developed a spontaneous opening over the right transmet medial aspect over the first metatarsal head. She has been using collagen on this that she had leftover from her last stay in this clinic. She also has had an open area on the medial right calf from a vein harvest site from her CABG she. She said that this is never really closed since the vein harvest was done. With regards to her arterial status this is a major issue. She had an angiogram by Dr. Gwenlyn Found in September 2020. She developed a gangrenous right first and second toes. Ultimately she was referred to Dr. Trula Slade who did a right common femoral endarterectomy with patch angioplasty. This was on 07/21/2019 and she really had a good result. Dr. Gwenlyn Found did a atherectomy followed by a drug coated balloon angioplasty of her right  SFA popliteal and tibial peroneal trunk on 08/03/2019 again with excellent excellent results. Follow-up.Dopplers in November revealed revealed a widely patent SFA and tibioperoneal trunk. UNFORTUNATELY her recent angiogram that was done on 08/01/2020 showed an 80% right common femoral artery stenosis just above the previous endarterectomy site occluded right right SFA at the origin reconstituting the adductor canal by the profunda  femoris collaterals. The profunda femoris is also diffusely diseased. There is and again another 80% tibioperoneal trunk stenosis and one-vessel runoff via the peroneal artery. She is seen Dr. Donzetta Matters and she is scheduled for a femoral endarterectomy and right femoral-popliteal bypass grafting with endarterectomy of her tibial peroneal trunk. The scheduled surgery is on 11/23 The patient does not complain of pain at rest. She does have 5-minute walking claudication. The wounds on her right first met head remanent was spontaneous she does not think she hit this. As mentioned in the area on her right medial calf is a vein harvest site Dr. Gwenlyn Found had texted me about getting her in the clinic and we have arranged this as soon as we can. I have told her that we will have to see how successful Dr. Donzetta Matters is before we know what can be done about the wounds in a precise fashion. Fortunately there does not seem to be any obvious infection involving the right foot although that may need to be looked at if things really deteriorate. She was treated with IV antibiotics and hyperbaric oxygen for her previous osteomyelitis 12/2; the patient underwent a right femoral endarterectomy and bypass femoral to popliteal artery. This was done on 08/16/2020. She is done remarkably well. She has a small area on the right anterior tibia and a small area in the medial part of her original right transmetatarsal amputation. She appears to have had an excellent response to surgery. 12/16; the patient's area on  the right tibia and the small area on the medial part of her original right transmetatarsal amputation is totally healed READMISSION 06/15/2021 Mrs. Jolicoeur is a now 80 year old woman who arrives for review of wounds on the left TMA site extending into her plantar foot as well as an area on the left posterior calcaneus. 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 She was extensively revascularized on the right at that time as noted above in our previous notes ended up receiving IV antibiotics and hyperbaric oxygen therapy. Miraculously this TMA site actually closed and she really has had not any trouble with this since. UNFORTUNATELY the same cannot be said of her left side. Her problem apparently started sometime in July when she hit her left third toe on a barstool while playing pool. She developed an acute infection which eventually led to underlying osteomyelitis. She underwent an extensive hospitalization from 04/27/2021 through 05/15/2021. She received IV antibiotics. Also extensive intervention by Dr. Stanford Breed of vein and vascular which included a left common femoral to tibioperoneal trunk bypass as well as a left third toe amputation on 05/03/2021. Unfortunately the area did not heal she ended up requiring a left TMA. By her description this was left open. She has been using a wound VAC on this to earlier this week and then was switched to Santyl. I think because of the left forefoot off loader she is also developed an area on the left posterior calcaneus. She is not complaining of a lot of pain. She has her daughter at home to change the dressings. She has been prescribed Santyl and she has a tube that she is brought into our clinic. I cannot get a lot of history of claudication although she is really not that mobile at this point walking with a walker. 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 She has had multiple interventions on the right side by vein and vascular and also by Dr. Gwenlyn Found as well as a transmetatarsal amputation on the right. She has had follow-up noninvasive studies on 06/06/2021; on the left that showed an ABI of 0.47 with monophasic waveforms a dorsalis pedis ABI of 0.40 with monophasic waveforms. Her ANGIOGRAM which was initially done by Dr. Oneida Alar on 04/28/2021 showed severe left foot superficial femoral artery occlusive disease and an occlusion of the left popliteal artery with two-vessel runoff to the left foot with moderate to severe disease below the knee popliteal artery and tibial disease. She had 80% distal right external iliac artery above the existing femoral above-knee popliteal bypass but she does not have an open wound on the right foot She arrives in clinic with an extensive open area on the TMA site which in the mid aspect extends into the plantar foot. She also has a more superficial area on the upper part of her Achilles area of the left heel 9/29; patient arrives in clinic today with slough over the TMA site. We use MolecuLight to look at the surface of this suggesting cyan and white discoloration over a large part of this wound also extending into the third metatarsal area. She also has the area on the left posterior calcaneus. This is slough covered we will switch to [facility] here as well. There is some warmth in the area and some erythema we will give her antibiotics as well 10/6; completely necrotic surface once again on the left TMA site. We have been using Santyl and Hydrofera Blue. She has an area on the left posterior Achilles and actually states this has been more painful this week indeed there is some erythema around this area. She also describes I think claudication at rest which is relieved by dipping her left leg over the  side of the bed at night. 10/13; our intake nurse was able to brush some slough off the surface of the left TMA hence this did not require debridement that is better than last week. The area heading towards the plantar part of her foot required an aggressive debridement to clean this up. This is quite a sizable divot but does not go to bone. The area on the right Achilles heel is also covered in a high 100% nonviable tissue we have been using Santyl and silver alginate 10/20; the area on the left TMA looks much better. Even the area is spreading into the midfoot looks improved. The right Achilles still is covered and nonviable surface 100%. We have been using Santyl and silver alginate and this will continue for this week The patient is approved for 5 Apligraf's and I think she is good enough to start using these next week 10/27; the patient's wounds actually look quite good. We have been using Santyl and silver alginate. We applied Apligraf #1 today Saw Dr. Stanford Breed her vascular surgeon on 10/25 he told her TMA was healing nicely. She will follow-up with them in December to repeat noninvasive testing. 08/16/2021 upon evaluation today patient appears to be doing much better in regard to her wounds. Both are showing signs of improvement which is great news. I am going to perform some debridement to clear away some of the necrotic debris currently this will just be a light debridement. Nonetheless other than that I feel like that the Apligraf is doing quite well working and reapply today and this will be #  3 as far as the application is concerned. 12/20; since the patient was last here she was seen by Dr. Stanford Breed who felt that her Doppler flow was sluggish in the bypass. She therefore went underwent a urgent angiogram on 09/07/2021 by Dr. Carlis Abbott. She had an angioplasty of the distal anastomosis and the anterior tibial artery. She tolerated this well. She had no flow-limiting stenosis in the aortoiliac segment  on the left. Some calcification in the left common iliac artery but it was not flow-limiting the common femoral and profunda were patent. The left lower extremity bypass had brisk flow with no significant proximal anastomosis on the common femoral artery. Distally there was a high-grade stenosis where the bypass anastomosis was performed. This was angioplastied as well as the anterior tibial artery. 12/28; the patient comes in early because of a wound over the tip of her right first metatarsal head. She is not really sure how this happened. Says she noticed it when she took her sock off on Friday before she got in the shower. She has been using Hydrofera Blue to this area. This is the original wounded site we dealt with in the setting of a previous right TMA. The patient has PA I researched this on [place] link. As it turns out she has had recent arterial studies noninvasive on 09/05/2021. On the right she had dampened monophasic waveforms at the popliteal absent waveforms at the PTA and dorsalis pedis. An ABI could not be obtained previously at 0.47. The comment states that they were unable to detect Doppler waveforms of the posterior tibial and dorsalis pedis arteries. Electronic Signature(s) Signed: 09/20/2021 3:46:55 PM By: [name] MD Entered By: [name] on 09/20/2021 15:12:12 -------------------------------------------------------------------------------- Physical Exam Details Patient Name: Date of Service: [name], [name]. 09/20/2021 2:15 PM Medical Record Number: [id number] Patient Account Number: [id number] Date of Birth/Sex: Treating RN: 02-Nov-1940 (80 y.o. F) Primary Care Provider: [name] Other Clinician: Referring Provider: Treating Provider/[name]: Tressie Stalker in Treatment: 101 Constitutional Patient is hypertensive.. Pulse regular and within target range for patient.Marland Kitchen Respirations regular, non-labored and within  target range.. Temperature is normal and within the target range for the patient.Marland Kitchen Appears in no distress. Notes Wound exam; we have been following her for an area on her left TMA site making good progress. She had a recent intervention here. The new wound area is on the right over what was her first metatarsal head. Small wound however it probes straight to bone. I used a #3 curette bone scraping for culture Electronic Signature(s) Signed: 09/20/2021 3:46:55 PM By: [name] MD Entered By: [name] on 09/20/2021 15:13:28 -------------------------------------------------------------------------------- Physician Orders Details Patient Name: Date of Service: [name], [name]. 09/20/2021 2:15 PM Medical Record Number: [id number] Patient Account Number: [id number] Date of Birth/Sex: Treating RN: 28-Mar-1941 (80 y.o. 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 - with Dr. Dellia Nims Return A Cellular or Tissue Based Products Cellular or Tissue Based  Product Type: - 09/12/21 Apligraf#4 Bathing/ Shower/ Hygiene May shower with protection but do not get wound dressing(s) wet. - CANNOT GET DRESSING WET AT ALL!!!! Edema Control - Lymphedema / SCD / Other Elevate legs to the level of the heart or above for 30  minutes daily and/or when sitting, a frequency of: Avoid standing for long periods of time. Home Health New wound care orders this week; continue Home Health for wound care. May utilize formulary equivalent dressing for wound treatment orders unless otherwise specified. - [facility] to change M,W,F DO NOT REMOVE ADAPTIC AND STERISTRIPS New wound to right foot Wound Treatment Wound #13 - Amputation Site - Transmetatarsal Wound Laterality: Left Cleanser: Wound Cleanser (Home Health) 2 x Per Week/15 Days Discharge Instructions: Cleanse the wound with wound cleanser prior to applying a clean dressing using gauze sponges, not tissue or cotton balls. Prim Dressing: Apligraf #4 (Home Health) 2 x Per Week/15 Days ary Secondary Dressing: Woven Gauze Sponge, Non-Sterile 4x4 in (Home Health) 2 x Per Week/15 Days Discharge Instructions: Apply over primary dressing as directed. Secondary Dressing: ABD Pad, 5x9 (Home Health) 2 x Per Week/15 Days Discharge Instructions: Apply over primary dressing as directed. Secured With: [employer], 4.5x3.1 (in/yd) (Home Health) 2 x Per Week/15 Days Discharge Instructions: Secure with Kerlix as directed. Secured With: Paper Tape, 2x10 (in/yd) (Home Health) 2 x Per Week/15 Days Discharge Instructions: Secure dressing with tape as directed. Wound #14 - Calcaneus Wound Laterality: Left, Posterior Cleanser: Wound Cleanser (Home Health) 2 x Per Week/15 Days Discharge Instructions: Cleanse the wound with wound cleanser prior to applying a clean dressing using gauze sponges, not tissue or cotton balls. Prim Dressing: Apligraf#4 (Home Health) 2 x Per Week/15 Days ary Secondary Dressing: Woven Gauze Sponge, Non-Sterile 4x4 in (Home Health) 2 x Per Week/15 Days Discharge Instructions: Apply over primary dressing as directed. Secondary Dressing: ABD Pad, 5x9 (Home Health) 2 x Per Week/15 Days Discharge Instructions: Apply over primary dressing as directed. Secured  With: [employer], 4.5x3.1 (in/yd) (Home Health) 2 x Per Week/15 Days Discharge Instructions: Secure with Kerlix as directed. Secured With: Paper Tape, 2x10 (in/yd) (Home Health) 2 x Per Week/15 Days Discharge Instructions: Secure dressing with tape as directed. Wound #15 - Amputation Site - Transmetatarsal Wound Laterality: Right Cleanser: Soap and Water [facility]) Every Other Day/30 Days Discharge Instructions: May shower and wash wound with dial antibacterial soap and water prior to dressing change. Prim Dressing: Promogran Prisma Matrix, 4.34 (sq in) (silver collagen) (Home Health) Every Other Day/30 Days ary Discharge Instructions: Moisten collagen with [place] Jelly Secondary Dressing: Woven Gauze Sponges 2x2 in [facility]) Every Other Day/30 Days Discharge Instructions: Apply over primary dressing as directed. Secured With: [employer], 4.5x3.1 (in/yd) [facility]) Every Other Day/30 Days Discharge Instructions: Secure with Kerlix as directed. Secured With: Transpore Surgical Tape, 2x10 (in/yd) [facility]) Every Other Day/30 Days Discharge Instructions: Secure dressing with tape as directed. Secured With: Netting Every Other Day/30 Days Laboratory erobe culture (MICRO) - Right Foot Bacteria identified in Unspecified specimen by A LOINC Code: 017-4 Convenience Name: Areobic culture-specimen not specified Electronic Signature(s) Signed: 09/20/2021 3:30:35 PM By: Lorrin Jackson Signed: 09/20/2021 3:46:55 PM By: [name] MD Entered By: Lorrin Jackson on 09/20/2021 15:02:28 -------------------------------------------------------------------------------- Problem List Details Patient Name: Date of Service: [name], [name]. 09/20/2021 2:15 PM Medical Record Number: [id number] Patient Account Number: [id number] Date of Birth/Sex: Treating RN: Jul 29, 1941 (80 y.o. [name] Primary Care Provider: Lindell Noe,  Curt Bears Other Clinician: Referring  Provider: Treating Provider/[name]: Tressie Stalker in Treatment: 13 Active Problems ICD-10 Encounter Code Description Active Date MDM Diagnosis E11.621 Type 2 diabetes mellitus with foot ulcer 06/15/2021 No Yes T81.31XD Disruption of external operation (surgical) wound, not elsewhere classified, 06/15/2021 No Yes subsequent encounter L97.514 Non-pressure chronic ulcer of other part of right foot with necrosis of bone 09/20/2021 No Yes E11.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 06/15/2021 No Yes L97.528 Non-pressure chronic ulcer of other part of left foot with other specified 06/15/2021 No Yes severity L97.428 Non-pressure chronic ulcer of left heel and midfoot with other specified 06/15/2021 No Yes severity Inactive Problems Resolved Problems Electronic Signature(s) Signed: 09/20/2021 3:46:55 PM By: [name] MD Entered By: [name] on 09/20/2021 15:04:25 -------------------------------------------------------------------------------- Progress Note Details Patient Name: Date of Service: [name], [name]. 09/20/2021 2:15 PM Medical Record Number: [id number] Patient Account Number: [id number] Date of Birth/Sex: Treating RN: 02-14-1941 (80 y.o. F) Primary Care Provider: [name] Other Clinician: Referring Provider: Treating Provider/[name]: Tressie Stalker in Treatment: 13 Subjective History of Present Illness (HPI) ADMISSION 07/13/2019 Patient is a 80 year old type II diabetic. She has known PAD. She has been followed by Dr. Jacqualyn Posey of podiatry for blistering areas on her toes dating back to 04/30/2019 which at this was the left fourth toe. By 8/11 she had wounds on the right and left second toes. She underwent arterial studies by Dr. Alvester Chou on 7/31 that showed ABIs on the right of 0.60 TBI of 0.26 on the left ABI of 0.56 and a TBI of 0.25. By 9/10 she had  ischemic-looking wounds per Dr. Earleen Newport on the right first, left first second and third. She has been using Medihoney and then mupirocin more recently simply Betadine. The patient underwent angiography by Dr. Gwenlyn Found on 9/21. 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 Noted that there was 1 vessel runoff in the foot via the peroneal. ooOn the left there was an 80% left SFA, 70% mid left SFA. There was a short segment calcified CTO distal less than SFA and above-knee popliteal artery reconstituting with two-vessel runoff. 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 It was felt that she had bilateral SFA disease as well as tibial vessel disease. An attempt was made to revascularize the left SFA but they were unable to cross because of the highly calcified nature of the lesion. 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 She has an area on the medial left fourth toe with raised horned callus on top of this. I am not certain what this represents. With regards to pain she has about a 2-1/2 I will claudication tolerance in the grocery store. She has some pain in night which is relieved by putting her feet down over the side of the bed. She is wearing Nitro-Dur patches on the top of her right foot. Past medical history includes type 2 diabetes with secondary PAD, neoplasm of the skin, diabetic retinopathy, carotid artery stenosis, hyperlipidemia hypertension. 11/2; the last time the patient was here I spoke to Dr. Gwenlyn Found about revascularization options on the right. As I understand things currently Dr. Gwenlyn Found spoke with Dr. Trula Slade and ultimately the patient was taken to surgery  on 10/27. She had a right iliofemoral endarterectomy with a bovine patch angioplasty. I think  the plan now is for her to have a staged intervention on the right SFA by Dr. Alvester Chou. Per the patient's understanding Dr. Gwenlyn Found and Dr. Jacqualyn Posey are waiting to see when the gangrenous toes on the right foot can be amputated. The patient states her pain is a lot better and she is grateful for that. 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 Small area on the left fourth toe. She is using Betadine to the mummified areas on the right and Medihoney on the left 08/27/2019; the last time I saw this patient I discharged her from the clinic. She had been revascularized by Dr. Trula Slade and she had a right iliofemoral endarterectomy with a bovine angioplasty. 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 Jacqualyn Posey of podiatry on 08/07/2019. I note that she also had a intervention by Dr. Gwenlyn Found and he performed a directional atherectomy and drug-coated balloon angioplasty of the SFA and popliteal artery on the right. I am not certain of the date of Dr. Kennon Holter procedure as of the time of this dictation. She was referred back to [place] by Dr. Earleen Newport predominantly for follow-up of the left fourth toe. She still has sutures and stitches in the right TMA site. She states her pain is a lot better. She expresses concern about the condition of the amputation site at the TMA. She is on doxycycline I think prescribed by Dr. Earleen Newport. She is complaining of some pain at night 12/10; I spoke to Dr. Jacqualyn Posey last week. He removed the sutures on the right foot on Monday of this week. She has the area on the left fourth toe just proximal to the PIP and then the right TMA site. She is still on doxycycline and has enough through next week. Unfortunately the TMA site does not look good at all. Both on the lateral and medial part of the incisions are areas that probe to bone. There is purulence over the medial part which I have cultured. We will use silver alginate.  Left fourth toe looks somewhat better but there was still exposed bone 12/17; patient has an MRI booked for 12/30. Culture I did last week showed Pseudomonas Serratia and Enterococcus. This was purulent drainage coming out of the medial part of her amputation site. I use cefdinir 300 twice daily for 10 days that started on 12/15. Her x-ray on the right showed limited evaluation for osteomyelitis. The findings could have been postoperative. There was subtle erosion in the distal first and distal fifth metatarsal. An MRI was suggested. On the left she had irregularity of the fourth middle and proximal phalanx consistent with a history of osteomyelitis. I do not know that she has a history of osteomyelitis in this area. She had a newly defined area on the plantar third toe 12/31; patient's MRI is listed below: MRI OF THE RIGHT FOREFOOT WITHOUT AND WITH CONTRAST TECHNIQUE: Multiplanar, multisequence MR imaging of the right forefoot was performed before and after the administration of intravenous contrast. CONTRAST 6 mL GADAVIST IV SOLN : COMPARISON: Plain films right foot 09/04/2019. FINDINGS: Bones/Joint/Cartilage The patient is status post transmetatarsal amputation as seen on the prior exam. Marrow edema and enhancement are seen in all of the distal metatarsals. In the first metatarsal, signal change extends 1.5 cm proximal to the stump and in the second metatarsal extends approximately 2  cm proximal to the stump. Edema and enhancement are seen in only the distal 0.7 cm of the third metatarsal stump and tips of the fourth and fifth metatarsals. Bone marrow signal is otherwise unremarkable. A small focus of subchondral edema is seen in the lateral talus, likely degenerative. Ligaments Intact. Muscles and Tendons No intramuscular fluid collection. Soft tissues Skin ulceration is seen off the stump of the first metatarsal. A thin fluid collection tracks deep to the wound and over the  anterior metatarsals worrisome for small abscess. Intense subcutaneous edema and enhancement are seen diffusely. 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 Cellulitis about the foot. Skin ulceration over the distal first metatarsal is identified with a thin fluid collection tracking anteriorly along the stump worrisome for abscess. Small focus of subchondral edema in the lateral talus is likely degenerative. Electronically Signed By: Inge Rise M.D. On: 09/23/2019 15:25 Patient arrives in clinic today not complaining any of any pain. 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 She does not describe claudication however her activity is very limited. 10/01/2019; since the patient was last here I have communicated with Dr. Gwenlyn Found after bypass by Dr. Trula Slade and addressing the right superficial femoral artery he states that she is widely patent through peroneal arteries to the ankle with collaterals to the dorsalis pedis. He states he is going to talk to colleagues about the feasibility of tibial pedal access. The patient seems infectious disease later this afternoon Dr. Baxter Flattery. in preparation for this she has been off antibiotics for 1 week and I went ahead and obtain pieces of the remanence of her first metatarsal for pathology and CandS. The patient is a candidate for hyperbaric oxygen. 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 1/15; considerable improvement in the wounds on her feet. We are using silver collagen. She follows with Dr. Baxter Flattery of infectious disease and is on meropenem and daptomycin. She has been taught how to do this herself at home. Follows with Dr. Graylon Good at the end of treatment here. She has 2 wounds on the  surgical TMA site 1 lateral and 1 medial the lateral 1 has regressed tremendously. The area medially still has some exposed bone although the base of this looks healthy. 1/22; 2 separate wounds on the surgical TMA site. Both of these look satisfactory. The medial area does not have any exposed bone. This is an improvement On the left side fourth toe dorsally over the proximal phalanx there is a deep punched out area that probes to bone. She has an area on the tip of the left third toe. She also tells [place] that in HBO she traumatized her left Achilles and this is left her with a superficial wound area 1/28; weekly visit along with HBO. She has 5 wounds. T our punched out areas on the original TMA site on the right. Both of these appear to have contracted. o The area on the right no longer has exposed bone. She has an area on the tip of the left third toe and on the DIP of the left fourth toe. Both of these had surface callus that I removed and unfortunately they have small areas that both go to bone. She has a traumatic wound on the left Achilles area that happened in HBO and that appears  better. She is tolerating her IV antibiotics well at home. She has home health changing the dressings and she is doing it once on the weekends. We have been using silver collagen. She has been extensively revascularized on the right by Dr. Trula Slade and subsequently by Dr. Gwenlyn Found. 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 Baxter Flattery of infectious disease on 2/17. Dr. Gwenlyn Found on 2/23 and according to her her antibiotics stopped on 2/23. She is tolerating hyperbarics well. She has made a tremendous improvement in the right forefoot with only 2 smaller open wounds. Using silver collagen. On the left foot she has the area on the tip of the left third toe and the medial part of the left fourth toe.  These had exposed bone last week I did not sense any of that today 2/12; sees Dr. Graylon Good on 2/17 and Dr. Gwenlyn Found on 2/23. We are going to use Dermagraft on this today however the lateral part of her train TMA incision on the right is healed and the medial part is down so much that we just continue with the silver collagen She has wounds on the tip of her left third and the medial aspect of the left fourth. Both of these still have exposed bone I have not been able to get these to epithelialize. 2/19; she sees Dr. Gwenlyn Found on 2/23 and I have communicated with him about the left vascular supply. Looking at her angiogram from 08/03/2019 it looks as though that they could not cross the left CFA. Noted that she had one-vessel runoff bilaterally. She also apparently saw Dr. Graylon Good although I did not look up these results for preparation of this record. 2/26; Dr. Gwenlyn Found is going to do an angiogram next week on Wednesday. I think they are going to try to go at this both retrograde and anterograde to see if anything can be done to revascularize the left lower limb. She continues to make nice progress on the remaining wound on the right medial foot on her TMA site and the toe it wounds on the left are responding nicely as well 3/12; the patient underwent an extensive and complicated revascularization on the left leg by Dr. Fletcher Anon on 3/3; she had an atherectomy on occluded left popliteal artery; anterior tibial artery followed by a balloon angioplasty. Atherectomy was also performed to the left SFA because there was still significant 50% stenosis in the left popliteal artery they performed an intravascular lithotripsy which improved the residual stenosis to 20%. The same lithotripsy was used to dilate the proximal left SFA. She had a drug-eluting stent placed I believe in the SFA. The patient returned for hyperbarics this week. She has had some eye problems on the right which she tells me are secondary to diabetic  retinopathy and she saw her eye doctor. She is really made excellent progress there is no open wounds on the left foot at all. She has one open area medially and her TMA site on the right lateral wound is closed. 3/19; the patient comes in today with the area on the medial transmet on the right improved. Some debris removed from the surface revealed the still open wound. ooUnfortunately she now has the open area on the third toe tip and the medial aspect of the dorsal fourth toe. These are in the same location as her previous wounds. These  had actually closed up last week. 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 She is undergone complex revascularizations on the right by Dr. Trula Slade and Dr. Gwenlyn Found and I believe Dr. 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 She completed IVantibiotics as directed by infectious disease and I believe a course of oral antibiotics as well.she underwent a complete 40 treatments of hyperbarics. She comes in today with the vast majority of the right TMA site healed there is a small opening on the most medial aspect. Her left third and left fourth toes appear to have epithelialization although this has fluctuated somewhat. 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 She had a gaping wound on the right transmetatarsal amputation L as well as deep wounds on the left third and left fourth toes. She underwent complex revascularizations on her bilateral lower legs which I described on her last visit. She completed IV antibiotics by infectious disease and 40 treatments of hyperbarics. Her left third and fourth toes are healed. The area on the right transmetatarsal amputation site  is also closed today. READMISSION 08/04/2020 [name] is now a 80 year old woman that we had for a complex stay in our clinic from October 2000 14 January 2020. 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 Her current problems with wounds began on October 30 when she developed a spontaneous opening over the right transmet medial aspect over the first metatarsal head. She has been using collagen on this that she had leftover from her last stay in this clinic. She also has had an open area on the medial right calf from a vein harvest site from her CABG she. She said that this is never really closed since the vein harvest was done. With regards to her arterial status this is a major issue. She had an angiogram by Dr. Gwenlyn Found in September 2020. She developed a gangrenous right first and second toes. Ultimately she was referred to Dr. Trula Slade who did a right common femoral endarterectomy with patch angioplasty. This was on 07/21/2019 and she really had a good result. Dr. Gwenlyn Found did a atherectomy followed by a drug coated balloon angioplasty of her right SFA popliteal and tibial peroneal trunk on 08/03/2019 again with excellent excellent results. Follow-up.Dopplers in November revealed revealed a widely patent SFA and tibioperoneal trunk. UNFORTUNATELY her recent angiogram that was done on 08/01/2020 showed an 80% right common femoral artery stenosis just above the previous endarterectomy site occluded right right SFA at the origin reconstituting the adductor canal by the profunda femoris collaterals. The profunda femoris is also diffusely diseased. There is and again another 80% tibioperoneal trunk stenosis and one-vessel runoff via the peroneal artery. She is seen Dr. Donzetta Matters and she is scheduled for a femoral endarterectomy and right femoral-popliteal bypass grafting with  endarterectomy of her tibial peroneal trunk. The scheduled surgery is on 11/23 The patient does not complain of pain at rest. She does have 5-minute walking claudication. The wounds on her right first met head remanent was spontaneous she does not think she hit this. As mentioned  in the area on her right medial calf is a vein harvest site Dr. Gwenlyn Found had texted me about getting her in the clinic and we have arranged this as soon as we can. I have told her that we will have to see how successful Dr. Donzetta Matters is before we know what can be done about the wounds in a precise fashion. Fortunately there does not seem to be any obvious infection involving the right foot although that may need to be looked at if things really deteriorate. She was treated with IV antibiotics and hyperbaric oxygen for her previous osteomyelitis 12/2; the patient underwent a right femoral endarterectomy and bypass femoral to popliteal artery. This was done on 08/16/2020. She is done remarkably well. She has a small area on the right anterior tibia and a small area in the medial part of her original right transmetatarsal amputation. She appears to have had an excellent response to surgery. 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 Rochon is a now 80 year old woman who arrives for review of wounds on the left TMA site extending into her plantar foot as well as an area on the left posterior calcaneus. 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 She was extensively revascularized on the right at that time as noted above in our previous notes ended up receiving IV antibiotics and hyperbaric oxygen therapy. Miraculously this TMA site actually closed and she really has had not any trouble with this since. UNFORTUNATELY  the same cannot be said of her left side. Her problem apparently started sometime in July when she hit her left third toe on a barstool while playing pool. She developed an acute infection which eventually led to underlying osteomyelitis. She underwent an extensive hospitalization from 04/27/2021 through 05/15/2021. She received IV antibiotics. Also extensive intervention by Dr. Stanford Breed of vein and vascular which included a left common femoral to tibioperoneal trunk bypass as well as a left third toe amputation on 05/03/2021. Unfortunately the area did not heal she ended up requiring a left TMA. By her description this was left open. She has been using a wound VAC on this to earlier this week and then was switched to Santyl. I think because of the left forefoot off loader she is also developed an area on the left posterior calcaneus. She is not complaining of a lot of pain. She has her daughter at home to change the dressings. She has been prescribed Santyl and she has a tube that she is brought into our clinic. I cannot get a lot of history of claudication although she is really not that mobile at this point walking with a walker. 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 She has had multiple interventions on the right side by vein and vascular and also by Dr. Gwenlyn Found as well as a transmetatarsal amputation on the right. She has had follow-up noninvasive studies on 06/06/2021; on the left that showed an ABI of 0.47 with monophasic waveforms a dorsalis pedis ABI of 0.40 with monophasic waveforms. Her ANGIOGRAM which was initially done by Dr. Oneida Alar on 04/28/2021 showed severe left foot superficial femoral artery occlusive disease and an occlusion of the left  popliteal artery with two-vessel runoff to the left foot with moderate to severe disease below the knee popliteal  artery and tibial disease. She had 80% distal right external iliac artery above the existing femoral above-knee popliteal bypass but she does not have an open wound on the right foot She arrives in clinic with an extensive open area on the TMA site which in the mid aspect extends into the plantar foot. She also has a more superficial area on the upper part of her Achilles area of the left heel 9/29; patient arrives in clinic today with slough over the TMA site. We use MolecuLight to look at the surface of this suggesting cyan and white discoloration over a large part of this wound also extending into the third metatarsal area. She also has the area on the left posterior calcaneus. This is slough covered we will switch to [facility] here as well. There is some warmth in the area and some erythema we will give her antibiotics as well 10/6; completely necrotic surface once again on the left TMA site. We have been using Santyl and Hydrofera Blue. She has an area on the left posterior Achilles and actually states this has been more painful this week indeed there is some erythema around this area. She also describes I think claudication at rest which is relieved by dipping her left leg over the side of the bed at night. 10/13; our intake nurse was able to brush some slough off the surface of the left TMA hence this did not require debridement that is better than last week. The area heading towards the plantar part of her foot required an aggressive debridement to clean this up. This is quite a sizable divot but does not go to bone. The area on the right Achilles heel is also covered in a high 100% nonviable tissue we have been using Santyl and silver alginate 10/20; the area on the left TMA looks much better. Even the area is spreading into the midfoot looks improved. The right Achilles still is covered and nonviable surface 100%. We have been using Santyl and silver alginate and this will continue for this  week The patient is approved for 5 Apligraf's and I think she is good enough to start using these next week 10/27; the patient's wounds actually look quite good. We have been using Santyl and silver alginate. We applied Apligraf #1 today Saw Dr. Stanford Breed her vascular surgeon on 10/25 he told her TMA was healing nicely. She will follow-up with them in December to repeat noninvasive testing. 08/16/2021 upon evaluation today patient appears to be doing much better in regard to her wounds. Both are showing signs of improvement which is great news. I am going to perform some debridement to clear away some of the necrotic debris currently this will just be a light debridement. Nonetheless other than that I feel like that the Apligraf is doing quite well working and reapply today and this will be #3 as far as the application is concerned. 12/20; since the patient was last here she was seen by Dr. Stanford Breed who felt that her Doppler flow was sluggish in the bypass. She therefore went underwent a urgent angiogram on 09/07/2021 by Dr. Carlis Abbott. She had an angioplasty of the distal anastomosis and the anterior tibial artery. She tolerated this well. She had no flow-limiting stenosis in the aortoiliac segment on the left. Some calcification in the left common iliac artery but it was not flow-limiting the common femoral and profunda  were patent. The left lower extremity bypass had brisk flow with no significant proximal anastomosis on the common femoral artery. Distally there was a high-grade stenosis where the bypass anastomosis was performed. This was angioplastied as well as the anterior tibial artery. 12/28; the patient comes in early because of a wound over the tip of her right first metatarsal head. She is not really sure how this happened. Says she noticed it when she took her sock off on Friday before she got in the shower. She has been using Hydrofera Blue to this area. This is the original wounded site we dealt  with in the setting of a previous right TMA. The patient has PA I researched this on [place] link. As it turns out she has had recent arterial studies noninvasive on 09/05/2021. On the right she had dampened monophasic waveforms at the popliteal absent waveforms at the PTA and dorsalis pedis. An ABI could not be obtained previously at 0.47. The comment states that they were unable to detect Doppler waveforms of the posterior tibial and dorsalis pedis arteries. Objective Constitutional Patient is hypertensive.. Pulse regular and within target range for patient.Marland Kitchen Respirations regular, non-labored and within target range.. Temperature is normal and within the target range for the patient.Marland Kitchen Appears in no distress. Vitals Time Taken: 2:27 PM, Height: 64 in, Weight: 135 lbs, BMI: 23.2, Temperature: 97.8 F, Pulse: 91 bpm, Respiratory Rate: 16 breaths/min, Blood Pressure: 163/61 mmHg, Capillary Blood Glucose: 112 mg/dl. General Notes: Wound exam; we have been following her for an area on her left TMA site making good progress. She had a recent intervention here. The new wound area is on the right over what was her first metatarsal head. Small wound however it probes straight to bone. I used a #3 curette bone scraping for culture Integumentary (Hair, Skin) Wound #15 status is Open. Original cause of wound was Other Lesion. The date acquired was: 09/15/2021. 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 The wound measures 0.5cm length x 1cm width x 0.3cm depth; 0.393cm^2 area and 0.118cm^3 volume. There is Fat Layer (Subcutaneous Tissue) exposed. There is no tunneling or undermining noted. There is a medium amount of purulent drainage noted. The wound margin is distinct with the outline attached to the wound base. There is large (67-100%) pink, pale granulation within the wound bed. There is a small (1-33%) amount of necrotic tissue within the wound bed including Adherent  Slough. 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 There was a Excisional Skin/Subcutaneous Tissue/Muscle/Bone Debridement with a total area of 0.5 sq cm performed by Ricard Dillon., MD. With the following instrument(s): Curette to remove Non-Viable tissue/material. Material removed includes Bone,Subcutaneous Tissue and after achieving pain control using Other (Benzocaine). 1 specimen was taken by a Tissue Culture and sent to the lab per facility protocol. A time out was conducted at 14:47, prior to the start of the procedure. A Moderate amount  of bleeding was controlled with Silver Nitrate. The procedure was tolerated well. Post Debridement Measurements: 0.5cm length x 1cm width x 0.3cm depth; 0.118cm^3 volume. Character of Wound/Ulcer Post Debridement is stable. Severity of Tissue Post Debridement is: Fat layer exposed. Post procedure Diagnosis Wound #15: Same as Pre-Procedure Plan Follow-up Appointments: Return Appointment in 1 week. - with Dr. Dellia Nims Cellular or Tissue Based Products: Cellular or Tissue Based Product Type: - 09/12/21 Apligraf#4 Bathing/ Shower/ Hygiene: May shower with protection but do not get wound dressing(s) wet. - CANNOT GET DRESSING WET AT ALL!!!! Edema Control - Lymphedema / SCD / Other: Elevate  legs to the level of the heart or above for 30 minutes daily and/or when sitting, a frequency of: Avoid standing for long periods of time. Home Health: New wound care orders this week; continue Home Health for wound care. May utilize formulary equivalent dressing for wound treatment orders unless otherwise specified. - [facility] to change M,W,F DO NOT REMOVE ADAPTIC AND STERISTRIPS New wound to right foot Laboratory ordered were: Areobic culture-specimen not specified - Right Foot WOUND #13: - Amputation Site - Transmetatarsal Wound Laterality: Left Cleanser: Wound Cleanser (Home Health) 2 x Per Week/15 Days Discharge Instructions: Cleanse the wound with wound cleanser prior to applying a clean dressing using gauze sponges, not tissue or cotton balls. Prim Dressing: Apligraf #4 (Home Health) 2 x Per Week/15 Days ary Secondary Dressing: Woven Gauze Sponge, Non-Sterile 4x4 in (Home Health) 2 x Per Week/15 Days Discharge Instructions: Apply over primary dressing as directed. Secondary Dressing: ABD Pad, 5x9 (Home Health) 2 x Per Week/15 Days Discharge Instructions: Apply over primary dressing as directed. Secured With: [employer], 4.5x3.1 (in/yd) (Home Health) 2 x Per Week/15 Days Discharge Instructions: Secure with Kerlix as directed. Secured With: Paper T ape, 2x10 (in/yd) (Home Health) 2 x Per Week/15 Days Discharge Instructions: Secure dressing with tape as directed. WOUND #14: - Calcaneus Wound Laterality: Left, Posterior Cleanser: Wound Cleanser (Home Health) 2 x Per Week/15 Days Discharge Instructions: Cleanse the wound with wound cleanser prior to applying a clean dressing using gauze sponges, not tissue or cotton balls. Prim Dressing: Apligraf#4 (Home Health) 2 x Per Week/15 Days ary Secondary Dressing: Woven Gauze Sponge, Non-Sterile 4x4 in (Home Health) 2 x Per Week/15 Days Discharge Instructions: Apply over primary dressing as directed. Secondary Dressing: ABD Pad, 5x9  (Home Health) 2 x Per Week/15 Days Discharge Instructions: Apply over primary dressing as directed. Secured With: [employer], 4.5x3.1 (in/yd) (Home Health) 2 x Per Week/15 Days Discharge Instructions: Secure with Kerlix as directed. Secured With: Paper T ape, 2x10 (in/yd) (Home Health) 2 x Per Week/15 Days Discharge Instructions: Secure dressing with tape as directed. WOUND #15: - Amputation Site - Transmetatarsal Wound Laterality: Right Cleanser: Soap and Water [facility]) Every Other Day/30 Days Discharge Instructions: May shower and wash wound with dial antibacterial soap and water prior to dressing change. Prim Dressing: Promogran Prisma Matrix, 4.34 (sq in) (silver collagen) (Home Health) Every Other Day/30 Days ary Discharge Instructions: Moisten collagen with [place] Jelly Secondary Dressing: Woven Gauze Sponges 2x2 in [facility]) Every Other Day/30 Days Discharge Instructions: Apply over primary dressing as directed. Secured With: [employer], 4.5x3.1 (in/yd) [facility]) Every Other Day/30 Days Discharge Instructions: Secure with Kerlix as directed. Secured With: [employer] T ape, 2x10 (in/yd) (Home Health) Every Other Day/30 Days Discharge Instructions: Secure dressing with tape as directed. Secured With: Netting Every Other Day/30 Days 1. The cause of this wound  was not really clear. There was no trauma. It is quite likely that this is at least complicated by her severe known PAD. 2. Bone scraping culture for CandS but no empiric antibiotics 3. Moistened silver collagen 4. It is quite likely she is going to need to see Dr. Carlis Abbott at [facility]. She has faintly palpable posterior tibial and dorsalis pedis pulses although her recent noninvasive studies seemed very worrisome Electronic Signature(s) Signed: 09/20/2021 3:46:55 PM By: [name] MD Entered By: [name] on 09/20/2021  15:16:01 -------------------------------------------------------------------------------- SuperBill Details Patient Name: Date of Service: [name], [name] 09/20/2021 Medical Record Number: [id number] Patient Account Number: [id number] Date of Birth/Sex: Treating RN: 08/14/1941 (80 y.o. [name] Primary Care Provider: [name] Other Clinician: Referring Provider: Treating Provider/[name]: Tressie Stalker in Treatment: 13 Diagnosis Coding ICD-10 Codes Code Description [phone] Type 2 diabetes mellitus with foot ulcer T81.31XD Disruption of external operation (surgical) wound, not elsewhere classified, subsequent encounter L97.514 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 L97.528 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ity L97.428 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 Facility Procedures CPT4 Code: [id number] IC Description: 51700 - DEB BONE 20 SQ CM/< ICD-10 Diagnosis Description D-10 Diagnosis Description L97.514 Non-pressure chronic ulcer of other part of right foot with necrosis of bone Modifier: Quantity: 1 Physician Procedures CPT4: Description Modifier Code [id number] Debridement; bone (includes epidermis, dermis, subQ tissue, muscle and/or fascia, if performed) 1st 20 sqcm or less ICD-10 Diagnosis Description L97.514 Non-pressure chronic ulcer of other part of right foot with  necrosis of bone Quantity: 1 Electronic Signature(s) Signed: 09/20/2021 3:46:55 PM By: [name] MD Entered By: [name] on 09/20/2021 15:16:10

## 2021-09-20 NOTE — Progress Notes (Addendum)
JUNO, ALERS ([id number]) Visit Report for 09/20/2021 Arrival Information Details Patient Name: Date of Service: [name] 09/20/2021 2:15 PM Medical Record Number: [id number] Patient Account Number: [id number] Date of Birth/Sex: Treating RN: 02/08/1941 (80 y.o. [name] Primary Care [name]: [name] Other Clinician: Referring [name]: Treating [name]/[name]: [name] in Treatment: 7 Visit Information History Since Last Visit Added or deleted any medications: No Patient Arrived: [name] Any new allergies or adverse reactions: No Arrival Time: 14:26 Had a fall or experienced change in No Transfer Assistance: None activities of daily living that may affect Patient Identification Verified: Yes risk of falls: Secondary Verification Process Completed: Yes Signs or symptoms of abuse/neglect since No Patient Requires Transmission-Based Precautions: No last visito Patient Has Alerts: Yes Hospitalized since last visit: No Patient Alerts: Patient on Blood Thinner Implantable device outside of the clinic No L ABI: 0.82 excluding R ABI: 0.47 cellular tissue based products placed in the center since last visit: Has Dressing in Place as Prescribed: Yes Has Footwear/Offloading in Place as Yes Prescribed: Left: Surgical Shoe with Pressure Relief Insole Right: Surgical Shoe with Pressure Relief Insole Pain Present Now: No Electronic Signature(s) Signed: 09/20/2021 3:30:35 PM By: [name] Entered By: [name] on 09/20/2021 14:27:36 -------------------------------------------------------------------------------- Complex / Palliative Patient Assessment Details Patient Name: Date of Service: [name], [name] 09/20/2021 2:15 PM Medical Record Number: [id number] Patient Account Number: [id number] Date of Birth/Sex: Treating RN: 1940/10/08 (80 y.o. [name] Primary Care Chalee Hirota: [name] Other  Clinician: Referring Taj Arteaga: Treating Hailei Besser/[name]: [name] in Treatment: 13 Palliative Management Criteria Complex Wound Management Criteria Patient has remarkable or complex co-morbidities requiring medications or treatments that extend wound healing times. Examples: Diabetes mellitus with chronic renal failure or end stage renal disease requiring dialysis Advanced or poorly controlled rheumatoid arthritis Diabetes mellitus and end stage chronic obstructive pulmonary disease Active cancer with current chemo- or radiation therapy Type 2 Diabetes, PAD, CAD, HTN Care Approach Wound Care Plan: Complex Wound Management Electronic Signature(s) Signed: 09/21/2021 5:16:25 PM By: [name] Ham MD Signed: 09/22/2021 2:54:09 PM By: [name] Hurst RN, BSN Entered By: [name] on 09/21/2021 08:56:27 -------------------------------------------------------------------------------- Encounter Discharge Information Details Patient Name: Date of Service: [name], [name]. 09/20/2021 2:15 PM Medical Record Number: [id number] Patient Account Number: [id number] Date of Birth/Sex: Treating RN: 11/08/1940 (80 y.o. [name] Primary Care Shaynah Hund: [name] Other Clinician: Referring Aison Malveaux: Treating Rashi Giuliani/[name]: [name] in Treatment: 13 Encounter Discharge Information Items Post Procedure Vitals Discharge Condition: Stable Temperature (F): 97.8 Ambulatory Status: Cane Pulse (bpm): 91 Discharge Destination: Home Respiratory Rate (breaths/min): 16 Transportation: Private Auto Blood Pressure (mmHg): 163/61 Schedule Follow-up Appointment: Yes Clinical Summary of Care: Provided on 09/20/2021 Form Type Recipient Paper Patient Patient Electronic Signature(s) Signed: 09/20/2021 3:30:35 PM By: [name] Entered By: [name] on 09/20/2021  15:13:23 -------------------------------------------------------------------------------- Lower Extremity Assessment Details Patient Name: Date of Service: [name] 09/20/2021 2:15 PM Medical Record Number: [id number] Patient Account Number: [id number] Date of Birth/Sex: Treating RN: [birth year] (80 y.o. [name] Primary Care Keshona Kartes: [name] Other Clinician: Referring Louie Flenner: Treating Basim Bartnik/[name]: [name] in Treatment: 13 Edema Assessment Assessed: [name]: Yes] [name]: No] Edema: [Left: N] [Right: o] Calf Left: Right: Point of Measurement: From Medial Instep 33 cm Ankle Left: Right: Point of Measurement: From Medial Instep 20 cm Vascular Assessment Pulses: Dorsalis Pedis Palpable: [Left:Yes] Electronic Signature(s) Signed:  09/20/2021 3:30:35 PM By: [name] Entered By: [name] on 09/20/2021 14:30:56 -------------------------------------------------------------------------------- Multi Wound Chart Details Patient Name: Date of Service: [name], [name]. 09/20/2021 2:15 PM Medical Record Number: [id number] Patient Account Number: [id number] Date of Birth/Sex: Treating RN: April 18, 1941 (80 y.o. F) Primary Care Markel Mergenthaler: [name] Other Clinician: Referring Sufyaan Palma: Treating Domingo Fuson/[name]: [name] in Treatment: 13 Vital Signs Height(in): 64 Capillary Blood Glucose(mg/dl): 112 Weight(lbs): 135 Pulse(bpm): 91 Body Mass Index(BMI): 23 Blood Pressure(mmHg): 163/61 Temperature(F): 97.8 Respiratory Rate(breaths/min): 16 Photos: [15:No Photos Right Amputation Site -] [N/A:N/A N/A] Wound Location: [15:Transmetatarsal Other Lesion] [N/A:N/A] Wounding Event: [15:Diabetic Wound/Ulcer of the Lower] [N/A:N/A] Primary Etiology: [15:Extremity Coronary Artery Disease, Deep Vein N/A] Comorbid History: [15:Thrombosis, Hypertension, Myocardial  Infarction, Peripheral Arterial Disease, Type II Diabetes 09/15/2021] [N/A:N/A] Date Acquired: [15:0] [N/A:N/A] Weeks of Treatment: [15:Open] [N/A:N/A] Wound Status: [15:0.5x1x0.3] [N/A:N/A] Measurements L x W x D (cm) [15:0.393] [N/A:N/A] A (cm) : rea [15:0.118] [N/A:N/A] Volume (cm) : [15:Grade 1] [N/A:N/A] Classification: [15:Medium] [N/A:N/A] Exudate A mount: [15:Purulent] [N/A:N/A] Exudate Type: [15:yellow, brown, green] [N/A:N/A] Exudate Color: [15:Distinct, outline attached] [N/A:N/A] Wound Margin: [15:Large (67-100%)] [N/A:N/A] Granulation A mount: [15:Pink, Pale] [N/A:N/A] Granulation Quality: [15:Small (1-33%)] [N/A:N/A] Necrotic A mount: [15:Fat Layer (Subcutaneous Tissue): Yes N/A] Exposed Structures: [15:Fascia: No Tendon: No Muscle: No Joint: No Bone: No None] [N/A:N/A] Epithelialization: [15:Debridement - Excisional] [N/A:N/A] Debridement: Pre-procedure Verification/Time Out 14:47 [N/A:N/A] Taken: [15:Other] [N/A:N/A] Pain Control: [15:Subcutaneous] [N/A:N/A] Tissue Debrided: [15:Skin/Subcutaneous Tissue] [N/A:N/A] Level: [15:0.5] [N/A:N/A] Debridement A (sq cm): [15:rea Curette] [N/A:N/A] Instrument: [15:Moderate] [N/A:N/A] Bleeding: [15:Silver Nitrate] [N/A:N/A] Hemostasis A chieved: [15:Procedure was tolerated well] [N/A:N/A] Debridement Treatment Response: [15:0.5x1x0.3] [N/A:N/A] Post Debridement Measurements L x W x D (cm) [15:0.118] [N/A:N/A] Post Debridement Volume: (cm) [15:Debridement] [N/A:N/A] Treatment Notes Electronic Signature(s) Signed: 09/20/2021 3:46:55 PM By: [name] Ham MD Entered By: [name] on 09/20/2021 15:04:34 -------------------------------------------------------------------------------- Multi-Disciplinary Care Plan Details Patient Name: Date of Service: [name], [name]. 09/20/2021 2:15 PM Medical Record Number: [id number] Patient Account Number: [id number] Date of Birth/Sex: Treating RN: [birth year] (80 y.o. [name] Primary Care Ovila Lepage: [name] Other Clinician: Referring Everlie Eble: Treating Yacoub Diltz/[name]: [name] in Treatment: [place] reviewed with physician Active Inactive Nutrition Nursing Diagnoses: Impaired glucose control: actual or potential Potential for alteratiion in Nutrition/Potential for imbalanced nutrition Goals: Patient/caregiver agrees to and verbalizes understanding of need to use nutritional supplements and/or vitamins as prescribed Date Initiated: 06/15/2021 Date Inactivated: 09/20/2021 Target Resolution Date: 09/23/2021 Goal Status: Met Patient/caregiver will maintain therapeutic glucose control Date Initiated: 06/15/2021 Target Resolution Date: 10/17/2021 Goal Status: Active Interventions: Assess HgA1c results as ordered upon admission and as needed Assess patient nutrition upon admission and as needed per policy Provide education on elevated blood sugars and impact on wound healing Provide education on nutrition Treatment Activities: Education provided on Nutrition : 06/15/2021 Notes: 09/20/21: Glucose control ongoing. Wound/Skin Impairment Nursing Diagnoses: Impaired tissue integrity Knowledge deficit related to ulceration/compromised skin integrity Goals: Patient/caregiver will verbalize understanding of skin care regimen Date Initiated: 06/15/2021 Target Resolution Date: 10/17/2021 Goal Status: Active Interventions: Assess patient/caregiver ability to obtain necessary supplies Assess patient/caregiver ability to perform ulcer/skin care regimen upon admission and as needed Assess ulceration(s) every visit Provide education on ulcer and skin care Notes: 09/20/21: Wound care regimen continues Electronic Signature(s) Signed: 09/20/2021 3:30:35 PM By: [name] Entered By: [name] on 09/20/2021  14:41:19 -------------------------------------------------------------------------------- Pain Assessment Details Patient Name: Date of Service: [place], [place]. 09/20/2021  2:15 PM Medical Record Number: [id number] Patient Account Number: [id number] Date of Birth/Sex: Treating RN: Jun 29, 1941 (80 y.o. [name] Primary Care Baylie Drakes: [name] Other Clinician: Referring Noelani Harbach: Treating Jayveon Convey/[name]: [name] in Treatment: 13 Active Problems Location of Pain Severity and Description of Pain Patient Has Paino No Site Locations Pain Management and Medication Current Pain Management: Electronic Signature(s) Signed: 09/20/2021 3:30:35 PM By: [name] Entered By: [name] on 09/20/2021 14:30:41 -------------------------------------------------------------------------------- Patient/Caregiver Education Details Patient Name: Date of Service: [name] 12/28/2022andnbsp2:15 PM Medical Record Number: [id number] Patient Account Number: [id number] Date of Birth/Gender: Treating RN: Aug 05, 1941 (80 y.o. [name] Primary Care Physician: [name] Other Clinician: Referring Physician: Treating Physician/[name]: [name] in Treatment: 13 Education Assessment Education Provided To: Patient Education Topics Provided Elevated Blood Sugar/ Impact on Healing: Methods: Explain/Verbal, Printed Responses: State content correctly Wound/Skin Impairment: Methods: Demonstration, Explain/Verbal, Printed Responses: State content correctly Electronic Signature(s) Signed: 09/20/2021 3:30:35 PM By: [name] Entered By: [name] on 09/20/2021 14:42:15 -------------------------------------------------------------------------------- Wound Assessment Details Patient Name: Date of Service: [name], [name] 09/20/2021 2:15 PM Medical Record Number:  [id number] Patient Account Number: [id number] Date of Birth/Sex: Treating RN: Dec 24, 1940 (80 y.o. 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 Wound Open Wounding Event: Other Lesion Status: Date Acquired: 09/15/2021 Comorbid Coronary Artery Disease, Deep Vein Thrombosis, Hypertension, Weeks Of Treatment: 0 History: Myocardial Infarction, Peripheral Arterial Disease, Type II Diabetes Clustered Wound: No Wound Measurements Length: (cm) 0.5 Width: (cm) 1 Depth: (cm) 0.3 Area: (cm) 0.393 Volume: (cm) 0.118 % Reduction in Area: % Reduction in Volume: Epithelialization: None Tunneling: No Undermining: No Wound Description Classification: Grade 1 Wound Margin: Distinct, outline attached Exudate Amount: Medium Exudate Type: Purulent Exudate Color: yellow, brown, green Foul Odor After Cleansing: No Slough/Fibrino Yes Wound Bed Granulation Amount: Large (67-100%) Exposed Structure Granulation Quality: Pink, Pale Fascia Exposed: No Necrotic Amount: Small (1-33%) Fat Layer (Subcutaneous Tissue) Exposed: Yes Necrotic Quality: Adherent Slough Tendon Exposed: No Muscle Exposed: No Joint Exposed: No Bone Exposed: No Treatment Notes 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 Peri-Wound Care Topical Primary Dressing Promogran Prisma Matrix, 4.34 (sq in) (silver collagen) Discharge Instruction: Moisten collagen with [place] Jelly Secondary Dressing Woven Gauze Sponges 2x2 in Discharge Instruction: Apply over primary dressing as  directed. Secured With [employer], 4.5x3.1 (in/yd) Discharge Instruction: Secure with Kerlix as directed. Transpore Surgical Tape, 2x10 (in/yd) Discharge Instruction: Secure dressing with tape as directed. Netting Compression Wrap Compression Stockings Add-Ons Electronic Signature(s) Signed: 09/20/2021 3:30:35 PM By: [name] Entered By: [name] on 09/20/2021 14:38:28 -------------------------------------------------------------------------------- Vitals Details Patient Name: Date of Service: [name], [place]. 09/20/2021 2:15 PM Medical Record Number: [id number] Patient Account Number: [id number] Date of Birth/Sex: Treating RN: 01/24/1941 (80 y.o. [name] Primary Care Jonta Gastineau: [name] Other Clinician: Referring Elzabeth Mcquerry: Treating Seamus Warehime/[name]: [name] in Treatment: 13 Vital Signs Time Taken: 14:27 Temperature (F): 97.8 Height (in): 64 Pulse (bpm): 91 Weight (lbs): 135 Respiratory Rate (breaths/min): 16 Body Mass Index (BMI): 23.2 Blood Pressure (mmHg): 163/61 Capillary Blood Glucose (mg/dl): 112 Reference Range: 80 -  120 mg / dl Electronic Signature(s) Signed: 09/20/2021 3:30:35 PM By: [name] Entered By: [name] on 09/20/2021 14:30:32

## 2021-09-21 DIAGNOSIS — D638 Anemia in other chronic diseases classified elsewhere: Secondary | ICD-10-CM | POA: Diagnosis not present

## 2021-09-21 DIAGNOSIS — Z4781 Encounter for orthopedic aftercare following surgical amputation: Secondary | ICD-10-CM | POA: Diagnosis not present

## 2021-09-21 DIAGNOSIS — I70202 Unspecified atherosclerosis of native arteries of extremities, left leg: Secondary | ICD-10-CM | POA: Diagnosis not present

## 2021-09-21 DIAGNOSIS — L89620 Pressure ulcer of left heel, unstageable: Secondary | ICD-10-CM | POA: Diagnosis not present

## 2021-09-21 DIAGNOSIS — I10 Essential (primary) hypertension: Secondary | ICD-10-CM | POA: Diagnosis not present

## 2021-09-21 DIAGNOSIS — E11628 Type 2 diabetes mellitus with other skin complications: Secondary | ICD-10-CM | POA: Diagnosis not present

## 2021-09-26 ENCOUNTER — Ambulatory Visit (HOSPITAL_COMMUNITY)
Admission: RE | Admit: 2021-09-26 | Discharge: 2021-09-26 | Disposition: A | Discharge status: Home / Self Care | Payer: Medicare Other | Source: Ambulatory Visit | Attending: Internal Medicine | Admitting: Internal Medicine

## 2021-09-26 ENCOUNTER — Other Ambulatory Visit: Payer: Self-pay

## 2021-09-26 ENCOUNTER — Other Ambulatory Visit (HOSPITAL_COMMUNITY): Payer: Self-pay | Admitting: Internal Medicine

## 2021-09-26 ENCOUNTER — Encounter (HOSPITAL_BASED_OUTPATIENT_CLINIC_OR_DEPARTMENT_OTHER): Payer: Medicare Other | Attending: Internal Medicine | Admitting: Internal Medicine

## 2021-09-26 DIAGNOSIS — L97422 Non-pressure chronic ulcer of left heel and midfoot with fat layer exposed: Secondary | ICD-10-CM | POA: Diagnosis not present

## 2021-09-26 DIAGNOSIS — E11621 Type 2 diabetes mellitus with foot ulcer: Secondary | ICD-10-CM | POA: Diagnosis not present

## 2021-09-26 DIAGNOSIS — L97514 Non-pressure chronic ulcer of other part of right foot with necrosis of bone: Secondary | ICD-10-CM | POA: Insufficient documentation

## 2021-09-26 DIAGNOSIS — I2510 Atherosclerotic heart disease of native coronary artery without angina pectoris: Secondary | ICD-10-CM | POA: Insufficient documentation

## 2021-09-26 DIAGNOSIS — E1142 Type 2 diabetes mellitus with diabetic polyneuropathy: Secondary | ICD-10-CM | POA: Insufficient documentation

## 2021-09-26 DIAGNOSIS — E11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: Secondary | ICD-10-CM | POA: Insufficient documentation

## 2021-09-26 DIAGNOSIS — M869 Osteomyelitis, unspecified: Secondary | ICD-10-CM

## 2021-09-26 DIAGNOSIS — L97509 Non-pressure chronic ulcer of other part of unspecified foot with unspecified severity: Secondary | ICD-10-CM | POA: Diagnosis not present

## 2021-09-26 DIAGNOSIS — E1169 Type 2 diabetes mellitus with other specified complication: Secondary | ICD-10-CM | POA: Diagnosis not present

## 2021-09-26 DIAGNOSIS — M7989 Other specified soft tissue disorders: Secondary | ICD-10-CM | POA: Diagnosis not present

## 2021-09-26 DIAGNOSIS — L97522 Non-pressure chronic ulcer of other part of left foot with fat layer exposed: Secondary | ICD-10-CM | POA: Diagnosis not present

## 2021-09-26 DIAGNOSIS — T8131XA Disruption of external operation (surgical) wound, not elsewhere classified, initial encounter: Secondary | ICD-10-CM | POA: Diagnosis not present

## 2021-09-26 DIAGNOSIS — T8131XD Disruption of external operation (surgical) wound, not elsewhere classified, subsequent encounter: Secondary | ICD-10-CM | POA: Insufficient documentation

## 2021-09-26 DIAGNOSIS — Z951 Presence of aortocoronary bypass graft: Secondary | ICD-10-CM | POA: Insufficient documentation

## 2021-09-26 DIAGNOSIS — L97428 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: Secondary | ICD-10-CM | POA: Insufficient documentation

## 2021-09-26 LAB — AEROBIC/ANAEROBIC CULTURE W GRAM STAIN (SURGICAL/DEEP WOUND): Gram Stain: NONE SEEN

## 2021-09-26 NOTE — Progress Notes (Signed)
MARCIANNE, OZBUN ([id number]) Visit Report for 09/26/2021 Cellular or Tissue Based Product Details Patient Name: Date of Service: [name] 09/26/2021 8:45 A M Medical Record Number: [id number] Patient Account Number: [id number] Date of Birth/Sex: Treating RN: [birth year] (81 y.o. [name] Primary Care Provider: [name] Other Clinician: Referring Provider: Treating Provider/[name]: Tressie Stalker in Treatment: 14 Cellular or Tissue Based Product Type Wound #13 Left Amputation Site - Transmetatarsal Applied to: Performed By: Physician Ricard Dillon., MD Cellular or Tissue Based Product Type: Apligraf Level of Consciousness (Pre-procedure): Awake and Alert Pre-procedure Verification/Time Out Yes - 09:20 Taken: Location: genitalia / hands / feet / multiple digits Wound Size (sq cm): 0.32 Product Size (sq cm): 22 Waste Size (sq cm): 10 Waste Reason: wound size Amount of Product Applied (sq cm): 12 Lot #: [id number] Order #: 5 Expiration Date: 09/30/2021 Fenestrated: Yes Instrument: Blade Reconstituted: Yes Solution Type: normal saline Solution Amount: 6 ml Lot #: [id number] Solution Expiration Date: 05/25/2022 Secured: Yes Secured With: Steri-Strips Dressing Applied: Yes Primary Dressing: gauze, abd, kerlix Procedural Pain: 0 Post Procedural Pain: 0 Response to Treatment: Procedure was tolerated well Level of Consciousness (Post- Awake and Alert procedure): Post Procedure Diagnosis Same as Pre-procedure Electronic Signature(s) Signed: 09/26/2021 3:57:23 PM By: [name] MD Entered By: [name] on 09/26/2021 09:43:48 -------------------------------------------------------------------------------- Cellular or Tissue Based Product Details Patient Name: Date of Service: [name], [name]. 09/26/2021 8:45 A M Medical Record Number: [id number] Patient Account Number: [id number] Date of Birth/Sex: Treating  RN: 07-10-1941 (81 y.o. [name] Primary Care Provider: [name] Other Clinician: Referring Provider: Treating Provider/[name]: Tressie Stalker in Treatment: 14 Cellular or Tissue Based Product Type Wound #14 Left,Posterior Calcaneus Applied to: Performed By: Physician Ricard Dillon., MD Cellular or Tissue Based Product Type: Apligraf Level of Consciousness (Pre-procedure): Awake and Alert Pre-procedure Verification/Time Out Yes - 09:20 Taken: Location: genitalia / hands / feet / multiple digits Wound Size (sq cm): 0.16 Product Size (sq cm): 22 Waste Size (sq cm): 15 Waste Reason: wound size Amount of Product Applied (sq cm): 7 Lot #: [id number] Order #: 5 Expiration Date: 09/30/2021 Fenestrated: Yes Instrument: Blade Reconstituted: Yes Solution Type: normal saline Solution Amount: 6 ml Lot #: [id number] Solution Expiration Date: 05/25/2022 Secured: Yes Secured With: Steri-Strips Dressing Applied: Yes Primary Dressing: gauze, abd, kerlix Procedural Pain: 0 Post Procedural Pain: 0 Response to Treatment: Procedure was tolerated well Level of Consciousness (Post- Awake and Alert procedure): Post Procedure Diagnosis Same as Pre-procedure Electronic Signature(s) Signed: 09/26/2021 3:57:23 PM By: [name] MD Entered By: [name] on 09/26/2021 09:44:01 -------------------------------------------------------------------------------- HPI Details Patient Name: Date of Service: [name], [name]. 09/26/2021 8:45 A M Medical Record Number: [id number] Patient Account Number: [id number] Date of Birth/Sex: Treating RN: 1941-05-31 (81 y.o. [name] Primary Care Provider: [name] Other Clinician: Referring Provider: Treating Provider/[name]: Tressie Stalker in Treatment: 14 History of Present Illness HPI Description: ADMISSION 07/13/2019 Patient is a 81 year old type II  diabetic. She has known PAD. She has been followed by Dr. Jacqualyn Posey of podiatry for blistering areas on her toes dating back to 04/30/2019 which at this was the left fourth toe. By 8/11 she had wounds on the right and left second toes. She underwent arterial studies by Dr. Alvester Chou on 7/31 that showed ABIs on the right of 0.60 TBI of 0.26 on the left ABI of 0.56 and a TBI of 0.25.  By 9/10 she had ischemic-looking wounds per Dr. Earleen Newport on the right first, left first second and third. She has been using Medihoney and then mupirocin more recently simply Betadine. The patient underwent angiography by Dr. Gwenlyn Found on 9/21. 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 Noted that there was 1 vessel runoff in the foot via the peroneal. On the left there was an 80% left SFA, 70% mid left SFA. There was a short segment calcified CTO distal less than SFA and above-knee popliteal artery reconstituting with two-vessel runoff. 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 It was felt that she had bilateral SFA disease as well as tibial vessel disease. An attempt was made to revascularize the left SFA but they were unable to cross because of the highly calcified nature of the lesion. 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 She has an area on the medial left fourth toe with raised horned callus on top of this. I am not certain what this represents. With regards to pain she has about a 2-1/2 I will claudication tolerance in the grocery store. She has some pain in night which is relieved by putting her feet down over the side of the bed. She is wearing Nitro-Dur patches on the top of her right foot. Past medical history includes type 2 diabetes with secondary PAD, neoplasm of the skin, diabetic  retinopathy, carotid artery stenosis, hyperlipidemia hypertension. 11/2; the last time the patient was here I spoke to Dr. Gwenlyn Found about revascularization options on the right. As I understand things currently Dr. Gwenlyn Found spoke with Dr. Trula Slade and ultimately the patient was taken to surgery on 10/27. She had a right iliofemoral endarterectomy with a bovine patch angioplasty. I think the plan now is for her to have a staged intervention on the right SFA by Dr. Alvester Chou. Per the patient's understanding Dr. Gwenlyn Found and Dr. Jacqualyn Posey are waiting to see when the gangrenous toes on the right foot can be amputated. The patient states her pain is a lot better and she is grateful for that. 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 Small area on the left fourth toe. She is using Betadine to the mummified areas on the right and Medihoney on the left 08/27/2019; the last time I saw this patient I discharged her from the clinic. She had been revascularized by Dr. Trula Slade and she had a right iliofemoral endarterectomy with a bovine angioplasty. 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 Jacqualyn Posey of podiatry on 08/07/2019. I note that she also had a intervention by Dr. Gwenlyn Found and he performed a directional atherectomy and drug-coated balloon angioplasty of the SFA and popliteal artery on the right. I am not certain of the date of Dr. Kennon Holter procedure as of the time of this dictation. She was referred back to [place] by Dr. Earleen Newport predominantly for follow-up of the left fourth toe. She still has sutures and stitches in the right TMA site. She states her pain is a lot better. She expresses concern about the condition of the amputation site at the TMA. She is on doxycycline I think prescribed by Dr. Earleen Newport. She is complaining of some pain at night 12/10; I spoke  to Dr. Jacqualyn Posey last week. He removed the sutures on the right foot on Monday of this week.  She has the area on the left fourth toe just proximal to the PIP and then the right TMA site. She is still on doxycycline and has enough through next week. Unfortunately the TMA site does not look good at all. Both on the lateral and medial part of the incisions are areas that probe to bone. There is purulence over the medial part which I have cultured. We will use silver alginate. Left fourth toe looks somewhat better but there was still exposed bone 12/17; patient has an MRI booked for 12/30. Culture I did last week showed Pseudomonas Serratia and Enterococcus. This was purulent drainage coming out of the medial part of her amputation site. I use cefdinir 300 twice daily for 10 days that started on 12/15. Her x-ray on the right showed limited evaluation for osteomyelitis. The findings could have been postoperative. There was subtle erosion in the distal first and distal fifth metatarsal. An MRI was suggested. On the left she had irregularity of the fourth middle and proximal phalanx consistent with a history of osteomyelitis. I do not know that she has a history of osteomyelitis in this area. She had a newly defined area on the plantar third toe 12/31; patient's MRI is listed below: MRI OF THE RIGHT FOREFOOT WITHOUT AND WITH CONTRAST TECHNIQUE: Multiplanar, multisequence MR imaging of the right forefoot was performed before and after the administration of intravenous contrast. CONTRAST 6 mL GADAVIST IV SOLN : COMPARISON: Plain films right foot 09/04/2019. FINDINGS: Bones/Joint/Cartilage The patient is status post transmetatarsal amputation as seen on the prior exam. Marrow edema and enhancement are seen in all of the distal metatarsals. In the first metatarsal, signal change extends 1.5 cm proximal to the stump and in the second metatarsal extends approximately 2 cm proximal to the stump. Edema and enhancement are seen in only the distal 0.7 cm of the third metatarsal stump and tips of  the fourth and fifth metatarsals. Bone marrow signal is otherwise unremarkable. A small focus of subchondral edema is seen in the lateral talus, likely degenerative. Ligaments Intact. Muscles and Tendons No intramuscular fluid collection. Soft tissues Skin ulceration is seen off the stump of the first metatarsal. A thin fluid collection tracks deep to the wound and over the anterior metatarsals worrisome for small abscess. Intense subcutaneous edema and enhancement are seen diffusely. 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 Cellulitis about the foot. Skin ulceration over the distal first metatarsal is identified with a thin fluid collection tracking anteriorly along the stump worrisome for abscess. Small focus of subchondral edema in the lateral talus is likely degenerative. Electronically Signed By: Inge Rise M.D. On: 09/23/2019 15:25 Patient arrives in clinic today not complaining any of any pain. 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 She does not describe claudication however her activity is very limited. 10/01/2019; since the patient was last here I have communicated with Dr. Gwenlyn Found after bypass by Dr. Trula Slade and addressing the right superficial femoral artery he states that she is widely patent through peroneal arteries to the ankle with collaterals to the dorsalis pedis. He states he is going to talk to colleagues about the feasibility of tibial pedal access. The patient seems infectious disease later this afternoon Dr.  Snider. in preparation for this she has been off antibiotics for 1 week and I went ahead and obtain pieces of the remanence of her first metatarsal for pathology and CandS. The patient is a candidate for hyperbaric oxygen. 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 1/15; considerable improvement in the wounds on her feet. We are using silver collagen. She follows with Dr. Baxter Flattery of infectious disease and is on meropenem and daptomycin. She has been taught how to do this herself at home. Follows with Dr. Graylon Good at the end of treatment here. She has 2 wounds on the surgical TMA site 1 lateral and 1 medial the lateral 1 has regressed tremendously. The area medially still has some exposed bone although the base of this looks healthy. 1/22; 2 separate wounds on the surgical TMA site. Both of these look satisfactory. The medial area does not have any exposed bone. This is an improvement On the left side fourth toe dorsally over the proximal phalanx there is a deep punched out area that probes to bone. She has an area on the tip of the left third toe. She also tells [place] that in HBO she traumatized her left Achilles and this is left her with a superficial wound area 1/28; weekly visit along with HBO. She has 5 wounds. T our punched out areas on the original TMA site on the right. Both of these appear to have contracted. o The area on the right no longer has exposed bone. She has an area on the tip of the left third toe and on the DIP of the left fourth toe. Both of these had surface callus that I removed and unfortunately they have small areas that both go to bone. She has a traumatic wound on the left Achilles area that happened in HBO and that appears better. She is tolerating her IV antibiotics well at home. She has home health changing the dressings and she is doing it once on the weekends. We have been using silver collagen. She has been extensively revascularized on the right by Dr. Trula Slade and subsequently by Dr. Gwenlyn Found. According to her she has severe PAD on the left but there was nothing that could be done to revascularize this I will need to review these notes 2/5; the patient will  see Dr. Baxter Flattery of infectious disease on 2/17. Dr. Gwenlyn Found on 2/23 and according to her her antibiotics stopped on 2/23. She is tolerating hyperbarics well. She has made a tremendous improvement in the right forefoot with only 2 smaller open wounds. Using silver collagen. On the left foot she has the area on the tip of the left third toe and the medial part of the left fourth toe. These had exposed bone last week I did not sense any of that today 2/12; sees Dr. Graylon Good on 2/17 and Dr. Gwenlyn Found on 2/23. We are going to use Dermagraft on this today however the lateral part of her train TMA incision on the right is healed and the medial part is down so much that we just continue with the silver collagen She has wounds on the tip of her left third and the medial aspect of the left fourth. Both of these still have exposed bone I have not been able to get these to epithelialize. 2/19; she sees Dr. Gwenlyn Found on 2/23 and I have communicated with him about the left vascular supply. Looking at her angiogram from 08/03/2019 it looks as though that they  could not cross the left CFA. Noted that she had one-vessel runoff bilaterally. She also apparently saw Dr. Graylon Good although I did not look up these results for preparation of this record. 2/26; Dr. Gwenlyn Found is going to do an angiogram next week on Wednesday. I think they are going to try to go at this both retrograde and anterograde to see if anything can be done to revascularize the left lower limb. She continues to make nice progress on the remaining wound on the right medial foot on her TMA site and the toe it wounds on the left are responding nicely as well 3/12; the patient underwent an extensive and complicated revascularization on the left leg by Dr. Fletcher Anon on 3/3; she had an atherectomy on occluded left popliteal artery; anterior tibial artery followed by a balloon angioplasty. Atherectomy was also performed to the left SFA because there was still significant 50% stenosis  in the left popliteal artery they performed an intravascular lithotripsy which improved the residual stenosis to 20%. The same lithotripsy was used to dilate the proximal left SFA. She had a drug-eluting stent placed I believe in the SFA. The patient returned for hyperbarics this week. She has had some eye problems on the right which she tells me are secondary to diabetic retinopathy and she saw her eye doctor. She is really made excellent progress there is no open wounds on the left foot at all. She has one open area medially and her TMA site on the right lateral wound is closed. 3/19; the patient comes in today with the area on the medial transmet on the right improved. Some debris removed from the surface revealed the still open wound. Unfortunately she now has the open area on the third toe tip and the medial aspect of the dorsal fourth toe. These are in the same location as her previous wounds. These had actually closed up last week. 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 She is undergone complex revascularizations on the right by Dr. Trula Slade and Dr. Gwenlyn Found and I believe Dr. 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 She completed IVantibiotics as directed by infectious disease and I believe a course of oral antibiotics as well.she underwent a complete 40 treatments of hyperbarics. She comes in today with the vast majority of the right TMA site healed there is a small opening on the most medial aspect. Her left third and left fourth toes appear to have epithelialization although this has fluctuated somewhat. 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 She had a gaping wound on the right  transmetatarsal amputation L as well as deep wounds on the left third and left fourth toes. She underwent complex revascularizations on her bilateral lower legs which I described on her last visit. She completed IV antibiotics by infectious disease and 40 treatments of hyperbarics. Her left third and fourth toes are healed. The area on the right transmetatarsal amputation site is also closed today. READMISSION 08/04/2020 [name] is now a 81 year old woman that we had for a complex stay in our clinic from October 2000 14 January 2020. 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 Her current problems with wounds began on October 30 when she developed a spontaneous opening over the right transmet medial aspect over the first metatarsal head. She has been using collagen on this that she had leftover from her last stay in this clinic. She also has had an open area on the medial right calf from a vein harvest site from her CABG she. She said that this is never really closed since the vein harvest was done. With regards to her arterial status this is a major issue. She had an angiogram by Dr. Gwenlyn Found in September 2020. She developed a gangrenous right first and second toes. Ultimately she was referred to Dr. Trula Slade who did a right common femoral endarterectomy with patch angioplasty. This was on 07/21/2019 and she really had a good result. Dr. Gwenlyn Found did a atherectomy followed by a drug coated balloon angioplasty of her right SFA popliteal and tibial peroneal trunk on 08/03/2019 again with excellent excellent results. Follow-up.Dopplers in November revealed revealed a widely patent SFA and tibioperoneal trunk. UNFORTUNATELY her recent angiogram that was done on 08/01/2020 showed an 80% right common femoral artery stenosis just above the previous endarterectomy site  occluded right right SFA at the origin reconstituting the adductor canal by the profunda femoris collaterals. The profunda femoris is also diffusely diseased. There is and again another 80% tibioperoneal trunk stenosis and one-vessel runoff via the peroneal artery. She is seen Dr. Donzetta Matters and she is scheduled for a femoral endarterectomy and right femoral-popliteal bypass grafting with endarterectomy of her tibial peroneal trunk. The scheduled surgery is on 11/23 The patient does not complain of pain at rest. She does have 5-minute walking claudication. The wounds on her right first met head remanent was spontaneous she does not think she hit this. As mentioned in the area on her right medial calf is a vein harvest site Dr. Gwenlyn Found had texted me about getting her in the clinic and we have arranged this as soon as we can. I have told her that we will have to see how successful Dr. Donzetta Matters is before we know what can be done about the wounds in a precise fashion. Fortunately there does not seem to be any obvious infection involving the right foot although that may need to be looked at if things really deteriorate. She was treated with IV antibiotics and hyperbaric oxygen for her previous osteomyelitis 12/2; the patient underwent a right femoral endarterectomy and bypass femoral to popliteal artery. This was done on 08/16/2020. She is done remarkably well. She has a small area on the right anterior tibia and a small area in the medial part of her original right transmetatarsal amputation. She appears to have had an excellent response to surgery. 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 Raffo is a now 81 year old woman who arrives for review of wounds on the left TMA site extending into her plantar foot as well as an area on the left posterior calcaneus. We had her extensively  in 2021 for a failed right TMA in the setting of type 2 diabetes with angiopathy and underlying osteomyelitis. She was extensively revascularized on the right at that time as noted above in our previous notes ended up receiving IV antibiotics and hyperbaric oxygen therapy. Miraculously this TMA site actually closed and she really has  had not any trouble with this since. UNFORTUNATELY the same cannot be said of her left side. Her problem apparently started sometime in July when she hit her left third toe on a barstool while playing pool. She developed an acute infection which eventually led to underlying osteomyelitis. She underwent an extensive hospitalization from 04/27/2021 through 05/15/2021. She received IV antibiotics. Also extensive intervention by Dr. Stanford Breed of vein and vascular which included a left common femoral to tibioperoneal trunk bypass as well as a left third toe amputation on 05/03/2021. Unfortunately the area did not heal she ended up requiring a left TMA. By her description this was left open. She has been using a wound VAC on this to earlier this week and then was switched to Santyl. I think because of the left forefoot off loader she is also developed an area on the left posterior calcaneus. She is not complaining of a lot of pain. She has her daughter at home to change the dressings. She has been prescribed Santyl and she has a tube that she is brought into our clinic. I cannot get a lot of history of claudication although she is really not that mobile at this point walking with a walker. 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 She has had multiple interventions on the right side by vein and vascular and also by Dr. Gwenlyn Found as well as a transmetatarsal amputation on the right. She has had follow-up noninvasive studies on 06/06/2021; on the left  that showed an ABI of 0.47 with monophasic waveforms a dorsalis pedis ABI of 0.40 with monophasic waveforms. Her ANGIOGRAM which was initially done by Dr. Oneida Alar on 04/28/2021 showed severe left foot superficial femoral artery occlusive disease and an occlusion of the left popliteal artery with two-vessel runoff to the left foot with moderate to severe disease below the knee popliteal artery and tibial disease. She had 80% distal right external iliac artery above the existing femoral above-knee popliteal bypass but she does not have an open wound on the right foot She arrives in clinic with an extensive open area on the TMA site which in the mid aspect extends into the plantar foot. She also has a more superficial area on the upper part of her Achilles area of the left heel 9/29; patient arrives in clinic today with slough over the TMA site. We use MolecuLight to look at the surface of this suggesting cyan and white discoloration over a large part of this wound also extending into the third metatarsal area. She also has the area on the left posterior calcaneus. This is slough covered we will switch to [facility] here as well. There is some warmth in the area and some erythema we will give her antibiotics as well 10/6; completely necrotic surface once again on the left TMA site. We have been using Santyl and Hydrofera Blue. She has an area on the left posterior Achilles and actually states this has been more painful this week indeed there is some erythema around this area. She also describes I think claudication at rest which is relieved by dipping her left leg over the side of the bed at night. 10/13; our intake nurse was able to brush some slough off the surface of the left TMA hence this did not require debridement that is better than last week. The area heading towards the plantar part of her  foot required an aggressive debridement to clean this up. This is quite a sizable divot but does not go to bone.  The area on the right Achilles heel is also covered in a high 100% nonviable tissue we have been using Santyl and silver alginate 10/20; the area on the left TMA looks much better. Even the area is spreading into the midfoot looks improved. The right Achilles still is covered and nonviable surface 100%. We have been using Santyl and silver alginate and this will continue for this week The patient is approved for 5 Apligraf's and I think she is good enough to start using these next week 10/27; the patient's wounds actually look quite good. We have been using Santyl and silver alginate. We applied Apligraf #1 today Saw Dr. Stanford Breed her vascular surgeon on 10/25 he told her TMA was healing nicely. She will follow-up with them in December to repeat noninvasive testing. 08/16/2021 upon evaluation today patient appears to be doing much better in regard to her wounds. Both are showing signs of improvement which is great news. I am going to perform some debridement to clear away some of the necrotic debris currently this will just be a light debridement. Nonetheless other than that I feel like that the Apligraf is doing quite well working and reapply today and this will be #3 as far as the application is concerned. 12/20; since the patient was last here she was seen by Dr. Stanford Breed who felt that her Doppler flow was sluggish in the bypass. She therefore went underwent a urgent angiogram on 09/07/2021 by Dr. Carlis Abbott. She had an angioplasty of the distal anastomosis and the anterior tibial artery. She tolerated this well. She had no flow-limiting stenosis in the aortoiliac segment on the left. Some calcification in the left common iliac artery but it was not flow-limiting the common femoral and profunda were patent. The left lower extremity bypass had brisk flow with no significant proximal anastomosis on the common femoral artery. Distally there was a high-grade stenosis where the bypass anastomosis was performed.  This was angioplastied as well as the anterior tibial artery. 12/28; the patient comes in early because of a wound over the tip of her right first metatarsal head. She is not really sure how this happened. Says she noticed it when she took her sock off on Friday before she got in the shower. She has been using Hydrofera Blue to this area. This is the original wounded site we dealt with in the setting of a previous right TMA. The patient has PA I researched this on [place] link. As it turns out she has had recent arterial studies noninvasive on 09/05/2021. On the right she had dampened monophasic waveforms at the popliteal absent waveforms at the PTA and dorsalis pedis. An ABI could not be obtained previously at 0.47. The comment states that they were unable to detect Doppler waveforms of the posterior tibial and dorsalis pedis arteries. 2023 09/26/2021; her original left TMA wound is almost closed. I reapplied Apligraf to the remaining area. The area on the plantar foot is also closed as far as I can tell HOWEVER she comes in with a second wound on the right first metatarsal head remanent both of these probe to bone. I did a bone scraping of this last week which only showed Corynebacterium unlikely to be a true pathogen. Nevertheless I am going to put her on Augmentin today probably for 2 weeks. X-ray will be done today. She has an appointment  with Dr. Gwenlyn Saran at [facility] next Tuesday I will send him a note about my concerns about the right TMA Electronic Signature(s) Signed: 09/26/2021 3:57:23 PM By: [name] MD Entered By: [name] on 09/26/2021 09:47:48 -------------------------------------------------------------------------------- Physical Exam Details Patient Name: Date of Service: [name], [name]. 09/26/2021 8:45 A M Medical Record Number: [id number] Patient Account Number: [id number] Date of Birth/Sex: Treating RN: [birth year] (81 y.o. [name] Primary Care  Provider: [name] Other Clinician: Referring Provider: Treating Provider/[name]: Tressie Stalker in Treatment: 20 Constitutional Patient is hypertensive.. Pulse regular and within target range for patient.Marland Kitchen Respirations regular, non-labored and within target range.. Temperature is normal and within the target range for the patient.Marland Kitchen Appears in no distress. Cardiovascular Pedal pulses Absent on the right foot but present at the popliteal. Notes Wound exam; her left TMA wound both on the tip of the surgery and also the area going into her plantar foot in the mid aspect are all just about closed. I cannot see anything open on her plantar foot in the area on the tip has a small wound remaining. I reapplied Apligraf to what is open here. I also applied Apligraf to the left heel However she comes in with a concerning area on the right first met head remanent. New area which also probes to bone her original area is about the same. There is no overt infection here although I am concerned Electronic Signature(s) Signed: 09/26/2021 3:57:23 PM By: [name] MD Entered By: [name] on 09/26/2021 09:49:54 -------------------------------------------------------------------------------- Physician Orders Details Patient Name: Date of Service: [name], [name]. 09/26/2021 8:45 A M Medical Record Number: [id number] Patient Account Number: [id number] Date of Birth/Sex: Treating RN: 1941-09-05 (81 y.o. [name] Primary Care Provider: [name] Other Clinician: Referring Provider: Treating Provider/[name]: Tressie Stalker in Treatment: 14 Verbal / Phone Orders: No Diagnosis Coding ICD-10 Coding Code Description E11.621 Type 2 diabetes mellitus with foot ulcer T81.31XD Disruption of external operation (surgical) wound, not elsewhere classified, subsequent encounter L97.514 Non-pressure chronic ulcer of other  part of right foot with necrosis of bone E11.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 L97.528 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ity L97.428 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 Follow-up Appointments ppointment in 1 week. - with Dr. Dellia Nims Return A Cellular or Tissue Based Products Cellular or Tissue Based Product Type: - Apligraf #5 Cellular or Tissue Based Product applied to wound bed, secured with steri-strips, cover with Adaptic or Mepitel. (DO NOT REMOVE). Bathing/ Shower/ Hygiene May shower with protection but do not get wound dressing(s) wet. - CANNOT GET DRESSING WET AT ALL!!!! Edema Control - Lymphedema / SCD / Other Elevate legs to the level of the heart or above for 30 minutes daily and/or when sitting, a frequency of: - throughout the day Avoid standing for long periods of time. Home Health No change in wound care orders this week; continue Home Health for wound care. May utilize formulary equivalent dressing for wound treatment orders unless otherwise specified. Dressing changes to be completed by [facility] on Monday / Wednesday / Friday except when patient has scheduled visit at [facility]. Other Home Health Orders/Instructions: - [facility] Wound Treatment Wound #13 - Amputation Site - Transmetatarsal Wound Laterality: Left Cleanser: Wound Cleanser (Home Health) 2 x Per Week/15 Days Discharge Instructions: Cleanse the wound with wound  cleanser prior to applying a clean dressing using gauze sponges, not tissue or cotton balls. Prim Dressing: Apligraf #5 (Home Health) 2 x Per Week/15 Days ary Secondary Dressing: Woven Gauze Sponge, Non-Sterile 4x4 in (Home Health) 2 x Per Week/15 Days Discharge Instructions: Apply over primary dressing as directed. Secondary Dressing: ABD Pad, 5x9 (Home Health) 2 x Per Week/15 Days Discharge Instructions:  Apply over primary dressing as directed. Secured With: [employer], 4.5x3.1 (in/yd) (Home Health) 2 x Per Week/15 Days Discharge Instructions: Secure with Kerlix as directed. Secured With: Paper Tape, 2x10 (in/yd) (Home Health) 2 x Per Week/15 Days Discharge Instructions: Secure dressing with tape as directed. Wound #14 - Calcaneus Wound Laterality: Left, Posterior Cleanser: Wound Cleanser (Home Health) 2 x Per Week/15 Days Discharge Instructions: Cleanse the wound with wound cleanser prior to applying a clean dressing using gauze sponges, not tissue or cotton balls. Prim Dressing: Apligraf #5 (Home Health) 2 x Per Week/15 Days ary Secondary Dressing: Woven Gauze Sponge, Non-Sterile 4x4 in (Home Health) 2 x Per Week/15 Days Discharge Instructions: Apply over primary dressing as directed. Secondary Dressing: ABD Pad, 5x9 (Home Health) 2 x Per Week/15 Days Discharge Instructions: Apply over primary dressing as directed. Secured With: [employer], 4.5x3.1 (in/yd) (Home Health) 2 x Per Week/15 Days Discharge Instructions: Secure with Kerlix as directed. Secured With: Paper Tape, 2x10 (in/yd) (Home Health) 2 x Per Week/15 Days Discharge Instructions: Secure dressing with tape as directed. Wound #15 - Amputation Site - Transmetatarsal Wound Laterality: Right Cleanser: Soap and Water [facility]) Every Other Day/30 Days Discharge Instructions: May shower and wash wound with dial antibacterial soap and water prior to dressing change. Prim Dressing: Promogran Prisma Matrix, 4.34 (sq in) (silver collagen) (Home Health) Every Other Day/30 Days ary Discharge Instructions: Moisten collagen with [place] Jelly Secondary Dressing: Woven Gauze Sponges 2x2 in [facility]) Every Other Day/30 Days Discharge Instructions: Apply over primary dressing as directed. Secured With: [employer], 4.5x3.1 (in/yd) [facility]) Every Other Day/30 Days Discharge Instructions: Secure with Kerlix as  directed. Secured With: Transpore Surgical Tape, 2x10 (in/yd) [facility]) Every Other Day/30 Days Discharge Instructions: Secure dressing with tape as directed. Secured With: Netting Every Other Day/30 Days Wound #16 - Foot Wound Laterality: Plantar, Right Cleanser: Soap and Water [facility]) Every Other Day/30 Days Discharge Instructions: May shower and wash wound with dial antibacterial soap and water prior to dressing change. Prim Dressing: Promogran Prisma Matrix, 4.34 (sq in) (silver collagen) (Home Health) Every Other Day/30 Days ary Discharge Instructions: Moisten collagen with [place] Jelly Secondary Dressing: Woven Gauze Sponges 2x2 in [facility]) Every Other Day/30 Days Discharge Instructions: Apply over primary dressing as directed. Secured With: [employer], 4.5x3.1 (in/yd) [facility]) Every Other Day/30 Days Discharge Instructions: Secure with Kerlix as directed. Secured With: Transpore Surgical Tape, 2x10 (in/yd) [facility]) Every Other Day/30 Days Discharge Instructions: Secure dressing with tape as directed. Secured With: Netting Every Other Day/30 Days Radiology X-ray, right foot, complete view - Non healing wounds on right foot - (ICD10 E11.621 - Type 2 diabetes mellitus with foot ulcer) Patient Medications llergies: Demerol, scopolamine, Bactrim, Versed, tramadol A Notifications Medication Indication Start End wound infection right 09/26/2021 Augmentin foot DOSE oral 875 mg-125 mg tablet - 1 tablet oral bid for 2 weeks Electronic Signature(s) Signed: 09/26/2021 9:57:07 AM By: [name] MD Entered By: [name] on 09/26/2021 09:57:00 Prescription 09/26/2021 -------------------------------------------------------------------------------- Adella Nissen MD Patient Name: Provider: [birth year] [id number] Date of  Birth: NPI#Wanda Plump [id number] Sex: DEA #: [phone] Phone #: License #: [place] Patient Address: [address], [place] [address] [place],  [address] [phone] Allergies Demerol; scopolamine; Bactrim; Versed; tramadol Provider's Orders X-ray, right foot, complete view - ICD10: E11.621 - Non healing wounds on right foot Hand Signature: Date(s): Electronic Signature(s) Signed: 09/26/2021 3:57:23 PM By: [name] MD Entered By: [name] on 09/26/2021 09:57:09 -------------------------------------------------------------------------------- Problem List Details Patient Name: Date of Service: [name], [name]. 09/26/2021 8:45 A M Medical Record Number: [id number] Patient Account Number: [id number] Date of Birth/Sex: Treating RN: [birth year] (81 y.o. [name] Primary Care Provider: [name] Other Clinician: Referring Provider: Treating Provider/[name]: Tressie Stalker in Treatment: 14 Active Problems ICD-10 Encounter Code Description Active Date MDM Diagnosis E11.621 Type 2 diabetes mellitus with foot ulcer 06/15/2021 No Yes T81.31XD Disruption of external operation (surgical) wound, not elsewhere classified, 06/15/2021 No Yes subsequent encounter L97.514 Non-pressure chronic ulcer of other part of right foot with necrosis of bone 09/20/2021 No Yes E11.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 06/15/2021 No Yes L97.528 Non-pressure chronic ulcer of other part of left foot with other specified 06/15/2021 No Yes severity L97.428 Non-pressure chronic ulcer of left heel and midfoot with other specified 06/15/2021 No Yes severity Inactive Problems Resolved Problems Electronic Signature(s) Signed: 09/26/2021 3:57:23 PM By: [name] MD Entered By: [name] on 09/26/2021 09:43:20 -------------------------------------------------------------------------------- Progress Note Details Patient Name: Date of  Service: [name], [name]. 09/26/2021 8:45 A M Medical Record Number: [id number] Patient Account Number: [id number] Date of Birth/Sex: Treating RN: 24-Feb-1941 (81 y.o. [name] Primary Care Provider: [name] Other Clinician: Referring Provider: Treating Provider/[name]: Tressie Stalker in Treatment: 14 Subjective History of Present Illness (HPI) ADMISSION 07/13/2019 Patient is a 81 year old type II diabetic. She has known PAD. She has been followed by Dr. Jacqualyn Posey of podiatry for blistering areas on her toes dating back to 04/30/2019 which at this was the left fourth toe. By 8/11 she had wounds on the right and left second toes. She underwent arterial studies by Dr. Alvester Chou on 7/31 that showed ABIs on the right of 0.60 TBI of 0.26 on the left ABI of 0.56 and a TBI of 0.25. By 9/10 she had ischemic-looking wounds per Dr. Earleen Newport on the right first, left first second and third. She has been using Medihoney and then mupirocin more recently simply Betadine. The patient underwent angiography by Dr. Gwenlyn Found on 9/21. 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 Noted that there was 1 vessel runoff in the foot via the peroneal. ooOn the left there was an 80% left SFA, 70% mid left SFA. There was a short segment calcified CTO distal less than SFA and above-knee popliteal artery reconstituting with two-vessel runoff. 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 It was felt that she had bilateral SFA disease as well as tibial vessel disease. An attempt was made to revascularize the left SFA but they were unable to cross because of the highly calcified nature of the lesion. 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 She has an area  on  the medial left fourth toe with raised horned callus on top of this. I am not certain what this represents. With regards to pain she has about a 2-1/2 I will claudication tolerance in the grocery store. She has some pain in night which is relieved by putting her feet down over the side of the bed. She is wearing Nitro-Dur patches on the top of her right foot. Past medical history includes type 2 diabetes with secondary PAD, neoplasm of the skin, diabetic retinopathy, carotid artery stenosis, hyperlipidemia hypertension. 11/2; the last time the patient was here I spoke to Dr. Gwenlyn Found about revascularization options on the right. As I understand things currently Dr. Gwenlyn Found spoke with Dr. Trula Slade and ultimately the patient was taken to surgery on 10/27. She had a right iliofemoral endarterectomy with a bovine patch angioplasty. I think the plan now is for her to have a staged intervention on the right SFA by Dr. Alvester Chou. Per the patient's understanding Dr. Gwenlyn Found and Dr. Jacqualyn Posey are waiting to see when the gangrenous toes on the right foot can be amputated. The patient states her pain is a lot better and she is grateful for that. 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 Small area on the left fourth toe. She is using Betadine to the mummified areas on the right and Medihoney on the left 08/27/2019; the last time I saw this patient I discharged her from the clinic. She had been revascularized by Dr. Trula Slade and she had a right iliofemoral endarterectomy with a bovine angioplasty. 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 Jacqualyn Posey of podiatry on 08/07/2019. I note that she also had a intervention by Dr. Gwenlyn Found and he performed a directional atherectomy and drug-coated balloon angioplasty of the SFA and popliteal artery on the right. I am not certain of the date of Dr. Kennon Holter procedure as of the time of this dictation. She was  referred back to [place] by Dr. Earleen Newport predominantly for follow-up of the left fourth toe. She still has sutures and stitches in the right TMA site. She states her pain is a lot better. She expresses concern about the condition of the amputation site at the TMA. She is on doxycycline I think prescribed by Dr. Earleen Newport. She is complaining of some pain at night 12/10; I spoke to Dr. Jacqualyn Posey last week. He removed the sutures on the right foot on Monday of this week. She has the area on the left fourth toe just proximal to the PIP and then the right TMA site. She is still on doxycycline and has enough through next week. Unfortunately the TMA site does not look good at all. Both on the lateral and medial part of the incisions are areas that probe to bone. There is purulence over the medial part which I have cultured. We will use silver alginate. Left fourth toe looks somewhat better but there was still exposed bone 12/17; patient has an MRI booked for 12/30. Culture I did last week showed Pseudomonas Serratia and Enterococcus. This was purulent drainage coming out of the medial part of her amputation site. I use cefdinir 300 twice daily for 10 days that started on 12/15. Her x-ray on the right showed limited evaluation for osteomyelitis. The findings could have been postoperative. There was subtle erosion in the distal first and distal fifth metatarsal. An MRI was suggested. On the left she had irregularity of the fourth middle and proximal  phalanx consistent with a history of osteomyelitis. I do not know that she has a history of osteomyelitis in this area. She had a newly defined area on the plantar third toe 12/31; patient's MRI is listed below: MRI OF THE RIGHT FOREFOOT WITHOUT AND WITH CONTRAST TECHNIQUE: Multiplanar, multisequence MR imaging of the right forefoot was performed before and after the administration of intravenous contrast. CONTRAST 6 mL GADAVIST IV SOLN : COMPARISON: Plain films right  foot 09/04/2019. FINDINGS: Bones/Joint/Cartilage The patient is status post transmetatarsal amputation as seen on the prior exam. Marrow edema and enhancement are seen in all of the distal metatarsals. In the first metatarsal, signal change extends 1.5 cm proximal to the stump and in the second metatarsal extends approximately 2 cm proximal to the stump. Edema and enhancement are seen in only the distal 0.7 cm of the third metatarsal stump and tips of the fourth and fifth metatarsals. Bone marrow signal is otherwise unremarkable. A small focus of subchondral edema is seen in the lateral talus, likely degenerative. Ligaments Intact. Muscles and Tendons No intramuscular fluid collection. Soft tissues Skin ulceration is seen off the stump of the first metatarsal. A thin fluid collection tracks deep to the wound and over the anterior metatarsals worrisome for small abscess. Intense subcutaneous edema and enhancement are seen diffusely. 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 Cellulitis about the foot. Skin ulceration over the distal first metatarsal is identified with a thin fluid collection tracking anteriorly along the stump worrisome for abscess. Small focus of subchondral edema in the lateral talus is likely degenerative. Electronically Signed By: Inge Rise M.D. On: 09/23/2019 15:25 Patient arrives in clinic today not complaining any of any pain. 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 She does not describe claudication however her activity is very limited. 10/01/2019; since the patient was last here I have communicated with Dr. Gwenlyn Found after bypass by Dr. Trula Slade and addressing the right superficial femoral artery he states that she is widely patent  through peroneal arteries to the ankle with collaterals to the dorsalis pedis. He states he is going to talk to colleagues about the feasibility of tibial pedal access. The patient seems infectious disease later this afternoon Dr. Baxter Flattery. in preparation for this she has been off antibiotics for 1 week and I went ahead and obtain pieces of the remanence of her first metatarsal for pathology and CandS. The patient is a candidate for hyperbaric oxygen. 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 1/15; considerable improvement in the wounds on her feet. We are using silver collagen. She follows with Dr. Baxter Flattery of infectious disease and is on meropenem and daptomycin. She has been taught how to do this herself at home. Follows with Dr. Graylon Good at the end of treatment here. She has 2 wounds on the surgical TMA site 1 lateral and 1 medial the lateral 1 has regressed tremendously. The area medially still has some exposed bone although the base of this looks healthy. 1/22; 2 separate wounds on the surgical TMA site. Both of these look satisfactory. The medial area does not have any exposed bone. This is an improvement On the left side fourth toe dorsally over the proximal phalanx there is a deep punched out area that probes to bone. She has an area on the  tip of the left third toe. She also tells [place] that in HBO she traumatized her left Achilles and this is left her with a superficial wound area 1/28; weekly visit along with HBO. She has 5 wounds. T our punched out areas on the original TMA site on the right. Both of these appear to have contracted. o The area on the right no longer has exposed bone. She has an area on the tip of the left third toe and on the DIP of the left fourth toe. Both of these had surface callus that I removed and unfortunately they have small areas that both go to bone. She has a traumatic wound on the left Achilles area that happened in HBO  and that appears better. She is tolerating her IV antibiotics well at home. She has home health changing the dressings and she is doing it once on the weekends. We have been using silver collagen. She has been extensively revascularized on the right by Dr. Trula Slade and subsequently by Dr. Gwenlyn Found. 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 Baxter Flattery of infectious disease on 2/17. Dr. Gwenlyn Found on 2/23 and according to her her antibiotics stopped on 2/23. She is tolerating hyperbarics well. She has made a tremendous improvement in the right forefoot with only 2 smaller open wounds. Using silver collagen. On the left foot she has the area on the tip of the left third toe and the medial part of the left fourth toe. These had exposed bone last week I did not sense any of that today 2/12; sees Dr. Graylon Good on 2/17 and Dr. Gwenlyn Found on 2/23. We are going to use Dermagraft on this today however the lateral part of her train TMA incision on the right is healed and the medial part is down so much that we just continue with the silver collagen She has wounds on the tip of her left third and the medial aspect of the left fourth. Both of these still have exposed bone I have not been able to get these to epithelialize. 2/19; she sees Dr. Gwenlyn Found on 2/23 and I have communicated with him about the left vascular supply. Looking at her angiogram from 08/03/2019 it looks as though that they could not cross the left CFA. Noted that she had one-vessel runoff bilaterally. She also apparently saw Dr. Graylon Good although I did not look up these results for preparation of this record. 2/26; Dr. Gwenlyn Found is going to do an angiogram next week on Wednesday. I think they are going to try to go at this both retrograde and anterograde to see if anything can be done to revascularize the left lower limb. She continues to make  nice progress on the remaining wound on the right medial foot on her TMA site and the toe it wounds on the left are responding nicely as well 3/12; the patient underwent an extensive and complicated revascularization on the left leg by Dr. Fletcher Anon on 3/3; she had an atherectomy on occluded left popliteal artery; anterior tibial artery followed by a balloon angioplasty. Atherectomy was also performed to the left SFA because there was still significant 50% stenosis in the left popliteal artery they performed an intravascular lithotripsy which improved the residual stenosis to 20%. The same lithotripsy was used to dilate the proximal left SFA. She had a drug-eluting stent placed I believe in  the SFA. The patient returned for hyperbarics this week. She has had some eye problems on the right which she tells me are secondary to diabetic retinopathy and she saw her eye doctor. She is really made excellent progress there is no open wounds on the left foot at all. She has one open area medially and her TMA site on the right lateral wound is closed. 3/19; the patient comes in today with the area on the medial transmet on the right improved. Some debris removed from the surface revealed the still open wound. ooUnfortunately she now has the open area on the third toe tip and the medial aspect of the dorsal fourth toe. These are in the same location as her previous wounds. These had actually closed up last week. 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 She is undergone complex revascularizations on the right by Dr. Trula Slade and Dr. Gwenlyn Found and I believe Dr. 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 She completed IVantibiotics  as directed by infectious disease and I believe a course of oral antibiotics as well.she underwent a complete 40 treatments of hyperbarics. She comes in today with the vast majority of the right TMA site healed there is a small opening on the most medial aspect. Her left third and left fourth toes appear to have epithelialization although this has fluctuated somewhat. 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 She had a gaping wound on the right transmetatarsal amputation L as well as deep wounds on the left third and left fourth toes. She underwent complex revascularizations on her bilateral lower legs which I described on her last visit. She completed IV antibiotics by infectious disease and 40 treatments of hyperbarics. Her left third and fourth toes are healed. The area on the right transmetatarsal amputation site is also closed today. READMISSION 08/04/2020 [name] is now a 81 year old woman that we had for a complex stay in our clinic from October 2000 14 January 2020. 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 Her current problems with wounds began on October 30 when she developed a spontaneous opening over the right transmet medial aspect over the first metatarsal head. She has been using collagen on this that she had leftover from her last stay in this clinic. She also has had an open area on the medial right calf from a vein harvest site from her CABG she. She said that this is never really closed since the vein harvest was done. With regards to her arterial status this is a major issue. She had an angiogram by Dr. Gwenlyn Found in September 2020. She developed a gangrenous right first and second toes. Ultimately she was referred to Dr. Trula Slade who did a right common femoral endarterectomy with patch angioplasty. This was on 07/21/2019 and she  really had a good result. Dr. Gwenlyn Found did a atherectomy followed by a drug coated balloon angioplasty of her right SFA popliteal and tibial peroneal trunk on 08/03/2019 again with excellent excellent results. Follow-up.Dopplers in November revealed revealed a widely patent SFA and tibioperoneal trunk. UNFORTUNATELY  her recent angiogram that was done on 08/01/2020 showed an 80% right common femoral artery stenosis just above the previous endarterectomy site occluded right right SFA at the origin reconstituting the adductor canal by the profunda femoris collaterals. The profunda femoris is also diffusely diseased. There is and again another 80% tibioperoneal trunk stenosis and one-vessel runoff via the peroneal artery. She is seen Dr. Donzetta Matters and she is scheduled for a femoral endarterectomy and right femoral-popliteal bypass grafting with endarterectomy of her tibial peroneal trunk. The scheduled surgery is on 11/23 The patient does not complain of pain at rest. She does have 5-minute walking claudication. The wounds on her right first met head remanent was spontaneous she does not think she hit this. As mentioned in the area on her right medial calf is a vein harvest site Dr. Gwenlyn Found had texted me about getting her in the clinic and we have arranged this as soon as we can. I have told her that we will have to see how successful Dr. Donzetta Matters is before we know what can be done about the wounds in a precise fashion. Fortunately there does not seem to be any obvious infection involving the right foot although that may need to be looked at if things really deteriorate. She was treated with IV antibiotics and hyperbaric oxygen for her previous osteomyelitis 12/2; the patient underwent a right femoral endarterectomy and bypass femoral to popliteal artery. This was done on 08/16/2020. She is done remarkably well. She has a small area on the right anterior tibia and a small area in the medial part of her original right  transmetatarsal amputation. She appears to have had an excellent response to surgery. 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 Ostrum is a now 81 year old woman who arrives for review of wounds on the left TMA site extending into her plantar foot as well as an area on the left posterior calcaneus. 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 She was extensively revascularized on the right at that time as noted above in our previous notes ended up receiving IV antibiotics and hyperbaric oxygen therapy. Miraculously this TMA site actually closed and she really has had not any trouble with this since. UNFORTUNATELY the same cannot be said of her left side. Her problem apparently started sometime in July when she hit her left third toe on a barstool while playing pool. She developed an acute infection which eventually led to underlying osteomyelitis. She underwent an extensive hospitalization from 04/27/2021 through 05/15/2021. She received IV antibiotics. Also extensive intervention by Dr. Stanford Breed of vein and vascular which included a left common femoral to tibioperoneal trunk bypass as well as a left third toe amputation on 05/03/2021. Unfortunately the area did not heal she ended up requiring a left TMA. By her description this was left open. She has been using a wound VAC on this to earlier this week and then was switched to Santyl. I think because of the left forefoot off loader she is also developed an area on the left posterior calcaneus. She is not complaining of a lot of pain. She has her daughter at home to change the dressings. She has been prescribed Santyl and she has a tube that she is brought into our clinic. I cannot get a lot of history  of claudication although she is  really not that mobile at this point walking with a walker. 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 She has had multiple interventions on the right side by vein and vascular and also by Dr. Gwenlyn Found as well as a transmetatarsal amputation on the right. She has had follow-up noninvasive studies on 06/06/2021; on the left that showed an ABI of 0.47 with monophasic waveforms a dorsalis pedis ABI of 0.40 with monophasic waveforms. Her ANGIOGRAM which was initially done by Dr. Oneida Alar on 04/28/2021 showed severe left foot superficial femoral artery occlusive disease and an occlusion of the left popliteal artery with two-vessel runoff to the left foot with moderate to severe disease below the knee popliteal artery and tibial disease. She had 80% distal right external iliac artery above the existing femoral above-knee popliteal bypass but she does not have an open wound on the right foot She arrives in clinic with an extensive open area on the TMA site which in the mid aspect extends into the plantar foot. She also has a more superficial area on the upper part of her Achilles area of the left heel 9/29; patient arrives in clinic today with slough over the TMA site. We use MolecuLight to look at the surface of this suggesting cyan and white discoloration over a large part of this wound also extending into the third metatarsal area. She also has the area on the left posterior calcaneus. This is slough covered we will switch to [facility] here as well. There is some warmth in the area and some erythema we will give her antibiotics as well 10/6; completely necrotic surface once again on the left TMA site. We have been using Santyl and Hydrofera Blue. She has an area on the left posterior Achilles and actually states this has been more painful this week indeed there is some  erythema around this area. She also describes I think claudication at rest which is relieved by dipping her left leg over the side of the bed at night. 10/13; our intake nurse was able to brush some slough off the surface of the left TMA hence this did not require debridement that is better than last week. The area heading towards the plantar part of her foot required an aggressive debridement to clean this up. This is quite a sizable divot but does not go to bone. The area on the right Achilles heel is also covered in a high 100% nonviable tissue we have been using Santyl and silver alginate 10/20; the area on the left TMA looks much better. Even the area is spreading into the midfoot looks improved. The right Achilles still is covered and nonviable surface 100%. We have been using Santyl and silver alginate and this will continue for this week The patient is approved for 5 Apligraf's and I think she is good enough to start using these next week 10/27; the patient's wounds actually look quite good. We have been using Santyl and silver alginate. We applied Apligraf #1 today Saw Dr. Stanford Breed her vascular surgeon on 10/25 he told her TMA was healing nicely. She will follow-up with them in December to repeat noninvasive testing. 08/16/2021 upon evaluation today patient appears to be doing much better in regard to her wounds. Both are showing signs of improvement which is great news. I am going to perform some debridement to clear  away some of the necrotic debris currently this will just be a light debridement. Nonetheless other than that I feel like that the Apligraf is doing quite well working and reapply today and this will be #3 as far as the application is concerned. 12/20; since the patient was last here she was seen by Dr. Stanford Breed who felt that her Doppler flow was sluggish in the bypass. She therefore went underwent a urgent angiogram on 09/07/2021 by Dr. Carlis Abbott. She had an angioplasty of the distal  anastomosis and the anterior tibial artery. She tolerated this well. She had no flow-limiting stenosis in the aortoiliac segment on the left. Some calcification in the left common iliac artery but it was not flow-limiting the common femoral and profunda were patent. The left lower extremity bypass had brisk flow with no significant proximal anastomosis on the common femoral artery. Distally there was a high-grade stenosis where the bypass anastomosis was performed. This was angioplastied as well as the anterior tibial artery. 12/28; the patient comes in early because of a wound over the tip of her right first metatarsal head. She is not really sure how this happened. Says she noticed it when she took her sock off on Friday before she got in the shower. She has been using Hydrofera Blue to this area. This is the original wounded site we dealt with in the setting of a previous right TMA. The patient has PA I researched this on [place] link. As it turns out she has had recent arterial studies noninvasive on 09/05/2021. On the right she had dampened monophasic waveforms at the popliteal absent waveforms at the PTA and dorsalis pedis. An ABI could not be obtained previously at 0.47. The comment states that they were unable to detect Doppler waveforms of the posterior tibial and dorsalis pedis arteries. 2023 09/26/2021; her original left TMA wound is almost closed. I reapplied Apligraf to the remaining area. The area on the plantar foot is also closed as far as I can tell HOWEVER she comes in with a second wound on the right first metatarsal head remanent both of these probe to bone. I did a bone scraping of this last week which only showed Corynebacterium unlikely to be a true pathogen. Nevertheless I am going to put her on Augmentin today probably for 2 weeks. X-ray will be done today. She has an appointment with Dr. Gwenlyn Saran at vein [facility] vascular next Tuesday I will send him a note about my concerns about  the right TMA Objective Constitutional Patient is hypertensive.. Pulse regular and within target range for patient.Marland Kitchen Respirations regular, non-labored and within target range.. Temperature is normal and within the target range for the patient.Marland Kitchen Appears in no distress. Vitals Time Taken: 8:51 AM, Height: 64 in, Weight: 135 lbs, BMI: 23.2, Temperature: 97.9 F, Pulse: 91 bpm, Respiratory Rate: 16 breaths/min, Blood Pressure: 145/61 mmHg, Capillary Blood Glucose: 88 mg/dl. General Notes: glucose per pt report this AM Cardiovascular Pedal pulses Absent on the right foot but present at the popliteal. General Notes: Wound exam; her left TMA wound both on the tip of the surgery and also the area going into her plantar foot in the mid aspect are all just about closed. I cannot see anything open on her plantar foot in the area on the tip has a small wound remaining. I reapplied Apligraf to what is open here. I also applied Apligraf to the left heel However she comes in with a concerning area on the right first met  head remanent. New area which also probes to bone her original area is about the same. There is no overt infection here although I am concerned Integumentary (Hair, Skin) Wound #13 status is Open. Original cause of wound was Surgical Injury. The date acquired was: 05/11/2021. The wound has been in treatment 14 weeks. The wound is located on the Left Amputation Site - Transmetatarsal. The wound measures 0.4cm length x 0.8cm width x 0.1cm depth; 0.251cm^2 area and 0.025cm^3 volume. There is Fat Layer (Subcutaneous Tissue) exposed. There is no tunneling or undermining noted. There is a medium amount of serosanguineous drainage noted. The wound margin is distinct with the outline attached to the wound base. There is large (67-100%) red granulation within the wound bed. There is no necrotic tissue within the wound bed. Wound #14 status is Open. Original cause of wound was Pressure Injury. The date  acquired was: 05/11/2021. The wound has been in treatment 14 weeks. The wound is located on the Left,Posterior Calcaneus. The wound measures 0.4cm length x 0.4cm width x 0.1cm depth; 0.126cm^2 area and 0.013cm^3 volume. There is Fat Layer (Subcutaneous Tissue) exposed. There is no tunneling or undermining noted. There is a medium amount of serosanguineous drainage noted. The wound margin is flat and intact. There is large (67-100%) red granulation within the wound bed. There is a small (1-33%) amount of necrotic tissue within the wound bed including Adherent Slough. Wound #15 status is Open. Original cause of wound was Other Lesion. The date acquired was: 09/15/2021. 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 The wound measures 0.5cm length x 1.5cm width x 0.4cm depth; 0.589cm^2 area and 0.236cm^3 volume. There is bone and Fat Layer (Subcutaneous Tissue) exposed. There is no tunneling or undermining noted. There is a medium amount of serosanguineous drainage noted. The wound margin is distinct with the outline attached to the wound base. There is small (1-33%) pink, pale granulation within the wound bed. There is a large (67-100%) amount of necrotic tissue within the wound bed including Adherent Slough. Wound #16 status is Open. Original cause of wound was Gradually Appeared. The date acquired was: 09/26/2021. The wound is located on the [place]. The wound measures 0.3cm length x 0.5cm width x 0.2cm depth; 0.118cm^2 area and 0.024cm^3 volume. There is bone and Fat Layer (Subcutaneous Tissue) exposed. There is no tunneling or undermining noted. There is a medium amount of serosanguineous drainage noted. The wound margin is flat and intact. There is large (67-100%) pink granulation within the wound bed. There is a small (1-33%) amount of necrotic tissue within the wound bed including Adherent Slough. 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 A skin graft procedure using a bioengineered skin substitute/cellular or tissue based product was performed by Ricard Dillon., MD. Apligraf was applied and secured with Steri-Strips. 12 sq cm of product was utilized and 10 sq cm was wasted due to wound size. Post Application, gauze, abd, kerlix was applied. A Time Out was  conducted at 09:20, prior to the start of the procedure. The procedure was tolerated well with a pain level of 0 throughout and a pain level of 0 following the procedure. Post procedure Diagnosis Wound #13: Same as Pre-Procedure . Wound #14 Pre-procedure diagnosis of Wound #14 is a Diabetic Wound/Ulcer of the Lower Extremity located on the Left,Posterior Calcaneus. A skin graft procedure using a bioengineered skin substitute/cellular or tissue based product was performed by Ricard Dillon., MD with the following instrument(s): N/A. Apligraf was applied and secured with Steri-Strips. 7 sq cm of product was utilized and 15 sq cm was wasted due to wound size. Post Application, gauze, abd, kerlix was applied. A Time Out was conducted at 09:20, prior to the start of the procedure. The procedure was tolerated well with a pain level of 0 throughout and a pain level of 0 following the procedure. Post procedure Diagnosis Wound #14: Same as Pre-Procedure . Plan Follow-up Appointments: Return  Appointment in 1 week. - with Dr. Dellia Nims Cellular or Tissue Based Products: Cellular or Tissue Based Product Type: - Apligraf #5 Cellular or Tissue Based Product applied to wound bed, secured with steri-strips, cover with Adaptic or Mepitel. (DO NOT REMOVE). Bathing/ Shower/ Hygiene: May shower with protection but do not get wound dressing(s) wet. - CANNOT GET DRESSING WET AT ALL!!!! Edema Control - Lymphedema / SCD / Other: Elevate legs to the level of the heart or above for 30 minutes daily and/or when sitting, a frequency of: - throughout the day Avoid standing for long periods of time. Home Health: No change in wound care orders this week; continue Home Health for wound care. May utilize formulary equivalent dressing for wound treatment orders unless otherwise specified. Dressing changes to be completed by [place] on Monday / Wednesday / Friday except when patient has scheduled visit at [facility]. Other Home Health Orders/Instructions: - [id number] Radiology ordered were: X-ray, right foot, complete view - Non healing wounds on right foot WOUND #13: - Amputation Site - Transmetatarsal Wound Laterality: Left Cleanser: Wound Cleanser (Home Health) 2 x Per Week/15 Days Discharge Instructions: Cleanse the wound with wound cleanser prior to applying a clean dressing using gauze sponges, not tissue or cotton balls. Prim Dressing: Apligraf #5 (Home Health) 2 x Per Week/15 Days ary Secondary Dressing: Woven Gauze Sponge, Non-Sterile 4x4 in (Home Health) 2 x Per Week/15 Days Discharge Instructions: Apply over primary dressing as directed. Secondary Dressing: ABD Pad, 5x9 (Home Health) 2 x Per Week/15 Days Discharge Instructions: Apply over primary dressing as directed. Secured With: [employer], 4.5x3.1 (in/yd) (Home Health) 2 x Per Week/15 Days Discharge Instructions: Secure with Kerlix as directed. Secured With: Paper T ape, 2x10 (in/yd) (Home Health) 2 x Per Week/15  Days Discharge Instructions: Secure dressing with tape as directed. WOUND #14: - Calcaneus Wound Laterality: Left, Posterior Cleanser: Wound Cleanser (Home Health) 2 x Per Week/15 Days Discharge Instructions: Cleanse the wound with wound cleanser prior to applying a clean dressing using gauze sponges, not tissue or cotton balls. Prim Dressing: Apligraf #5 (Home Health) 2 x Per Week/15 Days ary Secondary Dressing: Woven Gauze Sponge, Non-Sterile 4x4 in (Home Health) 2 x Per Week/15 Days Discharge Instructions: Apply over primary dressing as directed. Secondary Dressing: ABD Pad, 5x9 (Home Health) 2 x Per Week/15 Days Discharge Instructions: Apply over primary dressing as directed. Secured With: [employer], 4.5x3.1 (in/yd) (Home Health) 2 x Per Week/15 Days Discharge Instructions: Secure with Kerlix as directed. Secured With: Paper  T ape, 2x10 (in/yd) (Home Health) 2 x Per Week/15 Days Discharge Instructions: Secure dressing with tape as directed. 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 Prim Dressing: Promogran Prisma Matrix, 4.34 (sq in) (silver collagen) (Home Health) Every Other Day/30 Days ary Discharge Instructions: Moisten collagen with [place] Jelly Secondary Dressing: Woven Gauze Sponges 2x2 in [facility]) Every Other Day/30 Days Discharge Instructions: Apply over primary dressing as directed. Secured With: [employer], 4.5x3.1 (in/yd) [facility]) Every Other Day/30 Days Discharge Instructions: Secure with Kerlix as directed. Secured With: [employer] T ape, 2x10 (in/yd) (Home Health) Every Other Day/30 Days Discharge Instructions: Secure dressing with tape as directed. Secured With: Netting Every Other Day/30 Days WOUND #16: - Foot Wound Laterality:  Plantar, Right Cleanser: Soap and Water [facility]) Every Other Day/30 Days Discharge Instructions: May shower and wash wound with dial antibacterial soap and water prior to dressing change. Prim Dressing: Promogran Prisma Matrix, 4.34 (sq in) (silver collagen) (Home Health) Every Other Day/30 Days ary Discharge Instructions: Moisten collagen with [place] Jelly Secondary Dressing: Woven Gauze Sponges 2x2 in [facility]) Every Other Day/30 Days Discharge Instructions: Apply over primary dressing as directed. Secured With: [employer], 4.5x3.1 (in/yd) [facility]) Every Other Day/30 Days Discharge Instructions: Secure with Kerlix as directed. Secured With: [employer] T ape, 2x10 (in/yd) (Home Health) Every Other Day/30 Days Discharge Instructions: Secure dressing with tape as directed. Secured With: Netting Every Other Day/30 Days 1. The area on the right foot is concerning. I am going to give her empiric Augmentin even though my culture last week was negative. Plain x-ray ordered 2. I am going to send a message to Dr. Donzetta Matters I suspect she is going to require an angiogram on the right. 3. Her original wound on the left TMA considerably better. I cannot show that anything on the plantar aspect of this original surgical wound is open 4. We are using Prisma on the right and we reapplied Apligraf and on the left but I think that should be done with regards to Apligraf after this application 5. I have asked her to get off her right foot while we check various potential causes including her vascularity, x-ray etc. Electronic Signature(s) Signed: 09/26/2021 3:57:23 PM By: [name] MD Entered By: [name] on 09/26/2021 09:53:22 -------------------------------------------------------------------------------- SuperBill Details Patient Name: Date of Service: [name], [name] 09/26/2021 Medical Record Number: [id number] Patient Account Number: [id number] Date of  Birth/Sex: Treating RN: May 29, 1941 (81 y.o. 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 Facility Procedures CPT4 Code: [id number] Description: (Facility Use Only) Apligraf 1 SQ CM Modifier: Quantity: [place] Code: [id number] Description: 93570 - SKIN SUB GRAFT FACE/NK/HF/G ICD-10 Diagnosis Description L97.528 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  L97.428 Non-pressure chronic ulcer of left heel and midfoot with other specified  severit Modifier: ity y Quantity: 1 Physician Procedures : CPT4 Code Description Modifier [id number] - WC PHYS SKIN SUB GRAFT FACE/NK/HF/G ICD-10 Diagnosis Description L97.528 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ity L97.428 Non-pressure chronic ulcer of left heel  and midfoot with other specified severity Quantity: 1 Electronic Signature(s) Signed: 09/26/2021 3:57:23 PM By: [name] MD Entered By: [name] on 09/26/2021 09:54:16

## 2021-09-26 NOTE — Progress Notes (Signed)
L[place], NONAKA ([id number]) Visit Report for 09/26/2021 Arrival Information Details Patient Name: Date of Service: [name] 09/26/2021 8:45 A M Medical Record Number: [id number] Patient Account Number: [id number] Date of Birth/Sex: Treating RN: 1941/04/16 (81 y.o. [name] Primary Care Joelys Staubs: [name] Other Clinician: Referring Seydina Holliman: Treating Angelee Bahr/[name]: Tressie Stalker in Treatment: 87 Visit Information History Since Last Visit Added or deleted any medications: No Patient Arrived: [name] Any new allergies or adverse reactions: No Arrival Time: 08:51 Had a fall or experienced change in No Accompanied By: alone activities of daily living that may affect Transfer Assistance: None risk of falls: Patient Identification Verified: Yes Signs or symptoms of abuse/neglect since last visito No Secondary Verification Process Completed: Yes Hospitalized since last visit: No Patient Requires Transmission-Based Precautions: No Implantable device outside of the clinic excluding No Patient Has Alerts: Yes cellular tissue based products placed in the center Patient Alerts: Patient on Blood Thinner since last visit: L ABI: 0.82 Has Dressing in Place as Prescribed: Yes R ABI: 0.47 Pain Present Now: No Electronic Signature(s) Signed: 09/26/2021 5:47:12 PM By: [name] RN, BSN Entered By: [name] on 09/26/2021 08:52:20 -------------------------------------------------------------------------------- Encounter Discharge Information Details Patient Name: Date of Service: [name], [name]. 09/26/2021 8:45 A M Medical Record Number: [id number] Patient Account Number: [id number] Date of Birth/Sex: Treating RN: July 03, 1941 (81 y.o. [name] Primary Care Jennea Rager: [name] Other Clinician: Referring Prarthana Parlin: Treating Hayleen Clinkscales/[name]: Tressie Stalker in Treatment: 14 Encounter  Discharge Information Items Post Procedure Vitals Discharge Condition: Stable Temperature (F): 97.9 Ambulatory Status: Cane Pulse (bpm): 91 Discharge Destination: Home Respiratory Rate (breaths/min): 16 Transportation: Private Auto Blood Pressure (mmHg): 145/61 Accompanied By: alone Schedule Follow-up Appointment: Yes Clinical Summary of Care: Patient Declined Electronic Signature(s) Signed: 09/26/2021 5:47:12 PM By: [name] RN, BSN Entered By: [name] on 09/26/2021 11:18:12 -------------------------------------------------------------------------------- Multi Wound Chart Details Patient Name: Date of Service: [name], [name]. 09/26/2021 8:45 A M Medical Record Number: [id number] Patient Account Number: [id number] Date of Birth/Sex: Treating RN: [birth year] (81 y.o. [name] Primary Care Earnest Mcgillis: [name] Other Clinician: Referring Kamaria Lucia: Treating Mirel Hundal/[name]: Tressie Stalker in Treatment: 14 Vital Signs Height(in): 10 Capillary Blood Glucose(mg/dl): 77 Weight(lbs): 135 Pulse(bpm): 61 Body Mass Index(BMI): 23 Blood Pressure(mmHg): 145/61 Temperature(F): 97.9 Respiratory Rate(breaths/min): 16 Photos: Left Amputation Site - Transmetatarsal Left, Posterior Calcaneus Right Amputation Site - Wound Location: Transmetatarsal Surgical Injury Pressure Injury Other Lesion Wounding Event: Diabetic Wound/Ulcer of the Lower Diabetic Wound/Ulcer of the Lower Diabetic Wound/Ulcer of the Lower Primary Etiology: Extremity Extremity Extremity Open Surgical Wound N/A N/A Secondary Etiology: Coronary Artery Disease, Deep Vein Coronary Artery Disease, Deep Vein Coronary Artery Disease, Deep Vein Comorbid History: Thrombosis, Hypertension, Myocardial Thrombosis, Hypertension, Myocardial Thrombosis, Hypertension, Myocardial Infarction, Peripheral Arterial Disease, Infarction, Peripheral Arterial Disease, Infarction,  Peripheral Arterial Disease, Type II Diabetes Type II Diabetes Type II Diabetes 05/11/2021 05/11/2021 09/15/2021 Date Acquired: 70 14 0 Weeks of Treatment: Open Open Open Wound Status: 0.4x0.8x0.1 0.4x0.4x0.1 0.5x1.5x0.4 Measurements L x W x D (cm) 0.251 0.126 0.589 A (cm) : rea 0.025 0.013 0.236 Volume (cm) : 99.30% 90.90% -49.90% % Reduction in A rea: 99.90% 90.60% -100.00% % Reduction in Volume: Grade 2 Grade 2 Grade 2 Classification: Medium Medium Medium Exudate A mount: Serosanguineous Serosanguineous Serosanguineous Exudate Type: red, brown red, brown red, brown Exudate Color: Distinct, outline attached Flat and Intact Distinct, outline attached Wound Margin: Large (67-100%) Large (  67-100%) Small (1-33%) Granulation A mount: Red Red Pink, Pale Granulation Quality: None Present (0%) Small (1-33%) Large (67-100%) Necrotic A mount: Fat Layer (Subcutaneous Tissue): Yes Fat Layer (Subcutaneous Tissue): Yes Fat Layer (Subcutaneous Tissue): Yes Exposed Structures: Fascia: No Fascia: No Bone: Yes Tendon: No Tendon: No Fascia: No Muscle: No Muscle: No Tendon: No Joint: No Joint: No Muscle: No Bone: No Bone: No Joint: No Large (67-100%) Medium (34-66%) None Epithelialization: Cellular or Tissue Based Product Cellular or Tissue Based Product N/A Procedures Performed: Wound Number: 16 N/A N/A Photos: N/A N/A Right, Plantar Foot N/A N/A Wound Location: Gradually Appeared N/A N/A Wounding Event: Diabetic Wound/Ulcer of the Lower N/A N/A Primary Etiology: Extremity N/A N/A N/A Secondary Etiology: Coronary Artery Disease, Deep Vein N/A N/A Comorbid History: Thrombosis, Hypertension, Myocardial Infarction, Peripheral Arterial Disease, Type II Diabetes 09/26/2021 N/A N/A Date Acquired: 0 N/A N/A Weeks of Treatment: Open N/A N/A Wound Status: 0.3x0.5x0.2 N/A N/A Measurements L x W x D (cm) 0.118 N/A N/A A (cm) : rea 0.024 N/A N/A Volume (cm)  : 0.00% N/A N/A % Reduction in A rea: 0.00% N/A N/A % Reduction in Volume: Grade 2 N/A N/A Classification: Medium N/A N/A Exudate A mount: Serosanguineous N/A N/A Exudate Type: red, brown N/A N/A Exudate Color: Flat and Intact N/A N/A Wound Margin: Large (67-100%) N/A N/A Granulation A mount: Pink N/A N/A Granulation Quality: Small (1-33%) N/A N/A Necrotic A mount: Fat Layer (Subcutaneous Tissue): Yes N/A N/A Exposed Structures: Bone: Yes Fascia: No Tendon: No Muscle: No Joint: No None N/A N/A Epithelialization: N/A N/A N/A Procedures Performed: Treatment Notes Electronic Signature(s) Signed: 09/26/2021 3:57:23 PM By: [name] MD Signed: 09/26/2021 5:47:12 PM By: [name] RN, BSN Entered By: [name] on 09/26/2021 09:43:31 -------------------------------------------------------------------------------- Multi-Disciplinary Care Plan Details Patient Name: Date of Service: [name], [name]. 09/26/2021 8:45 A M Medical Record Number: [id number] Patient Account Number: [id number] Date of Birth/Sex: Treating RN: 10-29-1940 (81 y.o. [name] Primary Care Oris Calmes: [name] Other Clinician: Referring Emaline Karnes: Treating Salaya Holtrop/[name]: Tressie Stalker in Treatment: [place] reviewed with physician Active Inactive Nutrition Nursing Diagnoses: Impaired glucose control: actual or potential Potential for alteratiion in Nutrition/Potential for imbalanced nutrition Goals: Patient/caregiver agrees to and verbalizes understanding of need to use nutritional supplements and/or vitamins as prescribed Date Initiated: 06/15/2021 Date Inactivated: 09/20/2021 Target Resolution Date: 09/23/2021 Goal Status: Met Patient/caregiver will maintain therapeutic glucose control Date Initiated: 06/15/2021 Target Resolution Date: 10/17/2021 Goal Status: Active Interventions: Assess HgA1c results as  ordered upon admission and as needed Assess patient nutrition upon admission and as needed per policy Provide education on elevated blood sugars and impact on wound healing Provide education on nutrition Treatment Activities: Education provided on Nutrition : 06/15/2021 Notes: 09/20/21: Glucose control ongoing. Wound/Skin Impairment Nursing Diagnoses: Impaired tissue integrity Knowledge deficit related to ulceration/compromised skin integrity Goals: Patient/caregiver will verbalize understanding of skin care regimen Date Initiated: 06/15/2021 Target Resolution Date: 10/17/2021 Goal Status: Active Interventions: Assess patient/caregiver ability to obtain necessary supplies Assess patient/caregiver ability to perform ulcer/skin care regimen upon admission and as needed Assess ulceration(s) every visit Provide education on ulcer and skin care Notes: 09/20/21: Wound care regimen continues Electronic Signature(s) Signed: 09/26/2021 5:47:12 PM By: [name] RN, BSN Entered By: [name] on 09/26/2021 11:16:03 -------------------------------------------------------------------------------- Pain Assessment Details Patient Name: Date of Service: [name], [name]. 09/26/2021 8:45 A M Medical Record Number: [id number] Patient Account Number: [id number] Date of Birth/Sex: Treating RN:  [birth year] (81 y.o. [name] Primary Care Joua Bake: [name] Other Clinician: Referring Ajla Mcgeachy: Treating Shilo Philipson/[name]: Tressie Stalker in Treatment: 14 Active Problems Location of Pain Severity and Description of Pain Patient Has Paino No Site Locations Pain Management and Medication Current Pain Management: Electronic Signature(s) Signed: 09/26/2021 5:47:12 PM By: [name] RN, BSN Entered By: [name] on 09/26/2021 08:53:12 -------------------------------------------------------------------------------- Patient/Caregiver Education  Details Patient Name: Date of Service: [name], [name] 1/3/2023andnbsp8:45 A M Medical Record Number: [id number] Patient Account Number: [id number] Date of Birth/Gender: Treating RN: Sep 20, 1941 (81 y.o. [name] Primary Care Physician: [name] Other Clinician: Referring Physician: Treating Physician/[name]: Tressie Stalker in Treatment: 14 Education Assessment Education Provided To: Patient Education Topics Provided Wound/Skin Impairment: Methods: Explain/Verbal Responses: State content correctly [employer]) Signed: 09/26/2021 5:47:12 PM By: [name] RN, BSN Entered By: [name] on 09/26/2021 11:16:21 -------------------------------------------------------------------------------- Wound Assessment Details Patient Name: Date of Service: [name], [name]. 09/26/2021 8:45 A M Medical Record Number: [id number] Patient Account Number: [id number] Date of Birth/Sex: Treating RN: 03-30-1941 (81 y.o. 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 Secondary Open Surgical Wound Wounding Event: Surgical Injury Etiology: Date Acquired: 05/11/2021 Wound Open Weeks Of Treatment: 14 Status: Clustered Wound: No Comorbid Coronary Artery Disease, Deep Vein Thrombosis, Hypertension, History: Myocardial Infarction, Peripheral Arterial Disease, Type II Diabetes Photos Wound Measurements Length: (cm) 0.4 Width: (cm) 0.8 Depth: (cm) 0.1 Area: (cm) 0.251 Volume: (cm) 0.025 % Reduction in Area: 99.3% % Reduction in Volume: 99.9% Epithelialization: Large (67-100%) Tunneling: No Undermining: No Wound Description Classification:  Grade 2 Wound Margin: Distinct, outline attached Exudate Amount: Medium Exudate Type: Serosanguineous Exudate Color: red, brown Foul Odor After Cleansing: No Slough/Fibrino No Wound Bed Granulation Amount: Large (67-100%) Exposed Structure Granulation Quality: Red Fascia Exposed: No Necrotic Amount: None Present (0%) Fat Layer (Subcutaneous Tissue) Exposed: Yes Tendon Exposed: No Muscle Exposed: No Joint Exposed: No Bone Exposed: No Treatment Notes Wound #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 Peri-Wound Care Topical Primary Dressing Apligraf #5 Secondary Dressing Woven Gauze Sponge, Non-Sterile 4x4 in Discharge Instruction: Apply over primary dressing as directed. ABD Pad, 5x9 Discharge Instruction: Apply over primary dressing as directed. Secured With [employer], 4.5x3.1 (in/yd) Discharge Instruction: Secure with Kerlix as directed. Paper Tape, 2x10 (in/yd) Discharge Instruction: Secure dressing with tape as directed. Compression Wrap Compression Stockings Add-Ons Electronic Signature(s) Signed: 09/26/2021 4:35:58 PM By: Rhae Hammock RN Signed: 09/26/2021 5:47:12 PM By: [name] RN, BSN Entered By: Rhae Hammock on 09/26/2021 09:07:49 -------------------------------------------------------------------------------- Wound Assessment Details Patient Name: Date of Service: [name], [name]. 09/26/2021 8:45 A M Medical Record Number: [id number] Patient Account Number: [id number] Date of Birth/Sex: Treating RN: 01-Oct-1940 (81 y.o. [name] Primary Care Amnah Breuer: [name] Other Clinician: Referring Josephene Marrone: Treating Coen Miyasato/[name]: Tressie Stalker in Treatment: 14 Wound Status Wound Number: 14 Primary Diabetic  Wound/Ulcer of the Lower Extremity Etiology: Wound Location: Left, Posterior Calcaneus Wound Open Wounding Event: Pressure Injury Status: Date Acquired: 05/11/2021 Comorbid Coronary Artery Disease, Deep Vein Thrombosis, Hypertension, Weeks Of Treatment: 14 History: Myocardial Infarction, Peripheral  Arterial Disease, Type II Diabetes Clustered Wound: No Photos Wound Measurements Length: (cm) 0.4 Width: (cm) 0.4 Depth: (cm) 0.1 Area: (cm) 0.126 Volume: (cm) 0.013 % Reduction in Area: 90.9% % Reduction in Volume: 90.6% Epithelialization: Medium (34-66%) Tunneling: No Undermining: No Wound Description Classification: Grade 2 Wound Margin: Flat and Intact Exudate Amount: Medium Exudate Type: Serosanguineous Exudate Color: red, brown Foul Odor After Cleansing: No Slough/Fibrino Yes Wound Bed Granulation Amount: Large (67-100%) Exposed Structure Granulation Quality: Red Fascia Exposed: No Necrotic Amount: Small (1-33%) Fat Layer (Subcutaneous Tissue) Exposed: Yes Necrotic Quality: Adherent Slough Tendon Exposed: No Muscle Exposed: No Joint Exposed: No Bone Exposed: No Treatment Notes Wound #14 (Calcaneus) Wound Laterality: Left, Posterior Cleanser Wound Cleanser Discharge Instruction: Cleanse the wound with wound cleanser prior to applying a clean dressing using gauze sponges, not tissue or cotton balls. Peri-Wound Care Topical Primary Dressing Apligraf #5 Secondary Dressing Woven Gauze Sponge, Non-Sterile 4x4 in Discharge Instruction: Apply over primary dressing as directed. ABD Pad, 5x9 Discharge Instruction: Apply over primary dressing as directed. Secured With [employer], 4.5x3.1 (in/yd) Discharge Instruction: Secure with Kerlix as directed. Paper Tape, 2x10 (in/yd) Discharge Instruction: Secure dressing with tape as directed. Compression Wrap Compression Stockings Add-Ons Electronic Signature(s) Signed: 09/26/2021 4:35:58 PM By: Rhae Hammock  RN Signed: 09/26/2021 5:47:12 PM By: [name] RN, BSN Entered By: Rhae Hammock on 09/26/2021 09:08:48 -------------------------------------------------------------------------------- Wound Assessment Details Patient Name: Date of Service: [name], [name]. 09/26/2021 8:45 A M Medical Record Number: [id number] Patient Account Number: [id number] Date of Birth/Sex: Treating RN: [birth year] (81 y.o. [name] Primary Care Jacklyn Branan: [name] Other Clinician: Referring Tachina Spoonemore: Treating Sheera Illingworth/[name]: Tressie Stalker in Treatment: 14 Wound Status Wound Number: 15 Primary Diabetic Wound/Ulcer of the Lower Extremity Etiology: Wound Location: Right Amputation Site - Transmetatarsal Wound Open Wounding Event: Other Lesion Status: Date Acquired: 09/15/2021 Comorbid Coronary Artery Disease, Deep Vein Thrombosis, Hypertension, Weeks Of Treatment: 0 History: Myocardial Infarction, Peripheral Arterial Disease, Type II Diabetes Clustered Wound: No Photos Wound Measurements Length: (cm) 0.5 Width: (cm) 1.5 Depth: (cm) 0.4 Area: (cm) 0.589 Volume: (cm) 0.236 % Reduction in Area: -49.9% % Reduction in Volume: -100% Epithelialization: None Tunneling: No Undermining: No Wound Description Classification: Grade 2 Wound Margin: Distinct, outline attached Exudate Amount: Medium Exudate Type: Serosanguineous Exudate Color: red, brown Foul Odor After Cleansing: No Slough/Fibrino Yes Wound Bed Granulation Amount: Small (1-33%) Exposed Structure Granulation Quality: Pink, Pale Fascia Exposed: No Necrotic Amount: Large (67-100%) Fat Layer (Subcutaneous Tissue) Exposed: Yes Necrotic Quality: Adherent Slough Tendon Exposed: No Muscle Exposed: No Joint Exposed: No Bone Exposed: Yes Treatment Notes Wound #15 (Amputation Site - Transmetatarsal) Wound Laterality: Right Cleanser Soap and Water Discharge Instruction: May shower and  wash wound with dial antibacterial soap and water prior to dressing change. Peri-Wound Care Topical Primary Dressing Promogran Prisma Matrix, 4.34 (sq in) (silver collagen) Discharge Instruction: Moisten collagen with [place] Jelly Secondary Dressing Woven Gauze Sponges 2x2 in Discharge Instruction: Apply over primary dressing as directed. Secured With [employer], 4.5x3.1 (in/yd) Discharge Instruction: Secure with Kerlix as directed. Transpore Surgical Tape, 2x10 (in/yd) Discharge Instruction: Secure dressing with tape as directed. Netting Compression Wrap Compression Stockings Add-Ons Electronic Signature(s) Signed: 09/26/2021 5:47:12 PM By: [name] RN, BSN Entered By: [name] on 09/26/2021 09:14:27 -------------------------------------------------------------------------------- Wound Assessment Details Patient Name: Date of Service: [name], [name]. 09/26/2021 8:45 A M Medical Record Number: [id number] Patient Account Number: [id number] Date of Birth/Sex: Treating RN: 1941-03-26 (80  y.o. [name] Primary Care Brodan Grewell: [name] Other Clinician: Referring Midori Dado: Treating Frankee Gritz/[name]: Tressie Stalker in Treatment: 14 Wound Status Wound Number: 16 Primary Diabetic Wound/Ulcer of the Lower Extremity Etiology: Wound Location: Right, Plantar Foot Wound Open Wounding Event: Gradually Appeared Status: Date Acquired: 09/26/2021 Comorbid Coronary Artery Disease, Deep Vein Thrombosis, Hypertension, Weeks Of Treatment: 0 History: Myocardial Infarction, Peripheral Arterial Disease, Type II Diabetes Clustered Wound: No Photos Wound Measurements Length: (cm) 0.3 Width: (cm) 0.5 Depth: (cm) 0.2 Area: (cm) 0.118 Volume: (cm) 0.024 % Reduction in Area: 0% % Reduction in Volume: 0% Epithelialization: None Tunneling: No Undermining: No Wound Description Classification: Grade 2 Wound Margin: Flat and  Intact Exudate Amount: Medium Exudate Type: Serosanguineous Exudate Color: red, brown Foul Odor After Cleansing: No Slough/Fibrino Yes Wound Bed Granulation Amount: Large (67-100%) Exposed Structure Granulation Quality: Pink Fascia Exposed: No Necrotic Amount: Small (1-33%) Fat Layer (Subcutaneous Tissue) Exposed: Yes Necrotic Quality: Adherent Slough Tendon Exposed: No Muscle Exposed: No Joint Exposed: No Bone Exposed: Yes Treatment Notes Wound #16 (Foot) Wound Laterality: Plantar, Right Cleanser Soap and Water Discharge Instruction: May shower and wash wound with dial antibacterial soap and water prior to dressing change. Peri-Wound Care Topical Primary Dressing Promogran Prisma Matrix, 4.34 (sq in) (silver collagen) Discharge Instruction: Moisten collagen with [place] Jelly Secondary Dressing Woven Gauze Sponges 2x2 in Discharge Instruction: Apply over primary dressing as directed. Secured With [employer], 4.5x3.1 (in/yd) Discharge Instruction: Secure with Kerlix as directed. Transpore Surgical Tape, 2x10 (in/yd) Discharge Instruction: Secure dressing with tape as directed. Netting Compression Wrap Compression Stockings Add-Ons Electronic Signature(s) Signed: 09/26/2021 5:47:12 PM By: [name] RN, BSN Entered By: [name] on 09/26/2021 09:14:05 -------------------------------------------------------------------------------- Vitals Details Patient Name: Date of Service: [name], [place]. 09/26/2021 8:45 A M Medical Record Number: [id number] Patient Account Number: [id number] Date of Birth/Sex: Treating RN: Jan 29, 1941 (81 y.o. [name] Primary Care Aziah Brostrom: [name] Other Clinician: Referring Jurnie Garritano: Treating Arcadio Cope/[name]: Tressie Stalker in Treatment: 14 Vital Signs Time Taken: 08:51 Temperature (F): 97.9 Height (in): 64 Pulse (bpm): 91 Weight (lbs): 135 Respiratory Rate (breaths/min):  16 Body Mass Index (BMI): 23.2 Blood Pressure (mmHg): 145/61 Capillary Blood Glucose (mg/dl): 88 Reference Range: 80 - 120 mg / dl Notes glucose per pt report this AM Electronic Signature(s) Signed: 09/26/2021 5:47:12 PM By: [name] RN, BSN Entered By: [name] on 09/26/2021 08:53:04

## 2021-09-27 ENCOUNTER — Other Ambulatory Visit: Payer: Self-pay

## 2021-09-27 DIAGNOSIS — I739 Peripheral vascular disease, unspecified: Secondary | ICD-10-CM

## 2021-09-28 DIAGNOSIS — D638 Anemia in other chronic diseases classified elsewhere: Secondary | ICD-10-CM | POA: Diagnosis not present

## 2021-09-28 DIAGNOSIS — Z4781 Encounter for orthopedic aftercare following surgical amputation: Secondary | ICD-10-CM | POA: Diagnosis not present

## 2021-09-28 DIAGNOSIS — I70202 Unspecified atherosclerosis of native arteries of extremities, left leg: Secondary | ICD-10-CM | POA: Diagnosis not present

## 2021-09-28 DIAGNOSIS — L89620 Pressure ulcer of left heel, unstageable: Secondary | ICD-10-CM | POA: Diagnosis not present

## 2021-09-28 DIAGNOSIS — I10 Essential (primary) hypertension: Secondary | ICD-10-CM | POA: Diagnosis not present

## 2021-09-28 DIAGNOSIS — E11628 Type 2 diabetes mellitus with other skin complications: Secondary | ICD-10-CM | POA: Diagnosis not present

## 2021-09-29 ENCOUNTER — Other Ambulatory Visit (INDEPENDENT_AMBULATORY_CARE_PROVIDER_SITE_OTHER): Payer: Medicare Other

## 2021-09-29 ENCOUNTER — Other Ambulatory Visit: Payer: Self-pay

## 2021-09-29 ENCOUNTER — Other Ambulatory Visit: Payer: Self-pay | Admitting: Endocrinology

## 2021-09-29 DIAGNOSIS — D638 Anemia in other chronic diseases classified elsewhere: Secondary | ICD-10-CM

## 2021-09-29 DIAGNOSIS — E1165 Type 2 diabetes mellitus with hyperglycemia: Secondary | ICD-10-CM

## 2021-09-29 DIAGNOSIS — D649 Anemia, unspecified: Secondary | ICD-10-CM

## 2021-09-29 LAB — BASIC METABOLIC PANEL
BUN: 34 mg/dL — ABNORMAL HIGH (ref 6–23)
CO2: 28 mEq/L (ref 19–32)
Calcium: 10.1 mg/dL (ref 8.4–10.5)
Chloride: 103 mEq/L (ref 96–112)
Creatinine, Ser: 0.97 mg/dL (ref 0.40–1.20)
GFR: 55.56 mL/min — ABNORMAL LOW (ref >60.00)
Glucose, Bld: 202 mg/dL — ABNORMAL HIGH (ref 70–99)
Potassium: 5.6 mEq/L — ABNORMAL HIGH (ref 3.5–5.1)
Sodium: 138 mEq/L (ref 135–145)

## 2021-09-29 LAB — MICROALBUMIN / CREATININE URINE RATIO
Creatinine,U: 23.4 mg/dL
Microalb Creat Ratio: 16.3 mg/g (ref 0.0–30.0)
Microalb, Ur: 3.8 mg/dL — ABNORMAL HIGH (ref 0.0–1.9)

## 2021-09-29 LAB — HEMOGLOBIN A1C: Hgb A1c MFr Bld: 7.6 % — ABNORMAL HIGH (ref 4.6–6.5)

## 2021-09-29 LAB — CBC
HCT: 36.6 % (ref 36.0–46.0)
Hemoglobin: 11.9 g/dL — ABNORMAL LOW (ref 12.0–15.0)
MCHC: 32.4 g/dL (ref 30.0–36.0)
MCV: 90 fl (ref 78.0–100.0)
Platelets: 258 10*3/uL (ref 150.0–400.0)
RBC: 4.07 Mil/uL (ref 3.87–5.11)
RDW: 16.8 % — ABNORMAL HIGH (ref 11.5–15.5)
WBC: 9.9 10*3/uL (ref 4.0–10.5)

## 2021-09-30 LAB — FRUCTOSAMINE: Fructosamine: 275 umol/L (ref 0–285)

## 2021-10-02 DIAGNOSIS — E11628 Type 2 diabetes mellitus with other skin complications: Secondary | ICD-10-CM | POA: Diagnosis not present

## 2021-10-02 DIAGNOSIS — I10 Essential (primary) hypertension: Secondary | ICD-10-CM | POA: Diagnosis not present

## 2021-10-02 DIAGNOSIS — D638 Anemia in other chronic diseases classified elsewhere: Secondary | ICD-10-CM | POA: Diagnosis not present

## 2021-10-02 DIAGNOSIS — I70202 Unspecified atherosclerosis of native arteries of extremities, left leg: Secondary | ICD-10-CM | POA: Diagnosis not present

## 2021-10-02 DIAGNOSIS — Z4781 Encounter for orthopedic aftercare following surgical amputation: Secondary | ICD-10-CM | POA: Diagnosis not present

## 2021-10-02 DIAGNOSIS — L89620 Pressure ulcer of left heel, unstageable: Secondary | ICD-10-CM | POA: Diagnosis not present

## 2021-10-03 ENCOUNTER — Other Ambulatory Visit: Payer: Self-pay

## 2021-10-03 ENCOUNTER — Encounter (HOSPITAL_BASED_OUTPATIENT_CLINIC_OR_DEPARTMENT_OTHER): Payer: Medicare Other | Admitting: Internal Medicine

## 2021-10-03 ENCOUNTER — Other Ambulatory Visit: Payer: Self-pay | Admitting: Internal Medicine

## 2021-10-03 ENCOUNTER — Other Ambulatory Visit (HOSPITAL_COMMUNITY): Payer: Self-pay | Admitting: Internal Medicine

## 2021-10-03 DIAGNOSIS — L97428 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: Secondary | ICD-10-CM | POA: Diagnosis not present

## 2021-10-03 DIAGNOSIS — L97514 Non-pressure chronic ulcer of other part of right foot with necrosis of bone: Secondary | ICD-10-CM | POA: Diagnosis not present

## 2021-10-03 DIAGNOSIS — L97509 Non-pressure chronic ulcer of other part of unspecified foot with unspecified severity: Secondary | ICD-10-CM

## 2021-10-03 DIAGNOSIS — Z951 Presence of aortocoronary bypass graft: Secondary | ICD-10-CM | POA: Diagnosis not present

## 2021-10-03 DIAGNOSIS — E1142 Type 2 diabetes mellitus with diabetic polyneuropathy: Secondary | ICD-10-CM | POA: Diagnosis not present

## 2021-10-03 DIAGNOSIS — L988 Other specified disorders of the skin and subcutaneous tissue: Secondary | ICD-10-CM | POA: Diagnosis not present

## 2021-10-03 DIAGNOSIS — T8131XD Disruption of external operation (surgical) wound, not elsewhere classified, subsequent encounter: Secondary | ICD-10-CM | POA: Diagnosis not present

## 2021-10-03 DIAGNOSIS — M869 Osteomyelitis, unspecified: Secondary | ICD-10-CM | POA: Diagnosis not present

## 2021-10-03 DIAGNOSIS — I2510 Atherosclerotic heart disease of native coronary artery without angina pectoris: Secondary | ICD-10-CM | POA: Diagnosis not present

## 2021-10-03 DIAGNOSIS — L89620 Pressure ulcer of left heel, unstageable: Secondary | ICD-10-CM | POA: Diagnosis not present

## 2021-10-03 DIAGNOSIS — E11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: Secondary | ICD-10-CM | POA: Diagnosis not present

## 2021-10-03 DIAGNOSIS — E11621 Type 2 diabetes mellitus with foot ulcer: Secondary | ICD-10-CM | POA: Diagnosis not present

## 2021-10-03 NOTE — Progress Notes (Signed)
[name] ([id number]) Visit Report for 10/03/2021 HPI Details Patient Name: Date of Service: [name] 10/03/2021 9:30 A M Medical Record Number: [id number] Patient Account Number: [id number] Date of Birth/Sex: Treating RN: Mar 27, 1941 (81 y.o. [name] Primary Care Provider: [name] Other Clinician: Referring Provider: Treating Provider/[name]: Tressie Stalker in Treatment: 15 History of Present Illness HPI Description: ADMISSION 07/13/2019 Patient is an 81 year old type II diabetic. She has known PAD. She has been followed by Dr. Jacqualyn Posey of podiatry for blistering areas on her toes dating back to 04/30/2019 which at this was the left fourth toe. By 8/11 she had wounds on the right and left second toes. She underwent arterial studies by Dr. Alvester Chou on 7/31 that showed ABIs on the right of 0.60 TBI of 0.26 on the left ABI of 0.56 and a TBI of 0.25. By 9/10 she had ischemic-looking wounds per Dr. Earleen Newport on the right first, left first second and third. She has been using Medihoney and then mupirocin more recently simply Betadine. The patient underwent angiography by Dr. Gwenlyn Found on 9/21. 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 Noted that there was 1 vessel runoff in the foot via the peroneal. On the left there was an 80% left SFA, 70% mid left SFA. There was a short segment calcified CTO distal less than SFA and above-knee popliteal artery reconstituting with two-vessel runoff. 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 It was felt that she had bilateral SFA disease as well as tibial vessel disease. An attempt was made to revascularize the left SFA but they were unable to cross because of the highly calcified nature of the lesion. 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 She has an area on the medial left fourth toe with raised horned callus on top of this. I am not certain what this represents. With regards to pain she has about a 2-1/2 I will claudication tolerance in the grocery store. She has some pain in night which is relieved by putting her feet down over the side of the bed. She is wearing Nitro-Dur patches on the top of her right foot. Past medical history includes type 2 diabetes with secondary PAD, neoplasm of the skin, diabetic retinopathy, carotid artery stenosis, hyperlipidemia hypertension. 11/2; the last time the patient was here I spoke to Dr. Gwenlyn Found about revascularization options on the right. As I understand things currently Dr. Gwenlyn Found spoke with Dr. Trula Slade and ultimately the patient was taken to surgery on 10/27. She had a right iliofemoral endarterectomy with a bovine patch angioplasty. I think the plan now is for her to have a staged intervention on the right SFA by Dr. Alvester Chou. Per the patient's understanding Dr. Gwenlyn Found and Dr. Jacqualyn Posey are waiting to see when the gangrenous toes on the right foot can be amputated. The patient states her pain is a lot better and she is grateful for that. 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 Small area on the left fourth toe. She is using Betadine to the mummified areas on the right and Medihoney on the left 08/27/2019; the last time I saw this patient I discharged her from the clinic. She had been revascularized by  Dr. Trula Slade and she had a right iliofemoral endarterectomy with a bovine angioplasty. 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 Jacqualyn Posey of podiatry on 08/07/2019. I note that she also had a intervention by Dr. Gwenlyn Found and he performed a directional atherectomy and drug-coated balloon angioplasty of the SFA and popliteal artery on the right. I am  not certain of the date of Dr. Kennon Holter procedure as of the time of this dictation. She was referred back to [place] by Dr. Earleen Newport predominantly for follow-up of the left fourth toe. She still has sutures and stitches in the right TMA site. She states her pain is a lot better. She expresses concern about the condition of the amputation site at the TMA. She is on doxycycline I think prescribed by Dr. Earleen Newport. She is complaining of some pain at night 12/10; I spoke to Dr. Jacqualyn Posey last week. He removed the sutures on the right foot on Monday of this week. She has the area on the left fourth toe just proximal to the PIP and then the right TMA site. She is still on doxycycline and has enough through next week. Unfortunately the TMA site does not look good at all. Both on the lateral and medial part of the incisions are areas that probe to bone. There is purulence over the medial part which I have cultured. We will use silver alginate. Left fourth toe looks somewhat better but there was still exposed bone 12/17; patient has an MRI booked for 12/30. Culture I did last week showed Pseudomonas Serratia and Enterococcus. This was purulent drainage coming out of the medial part of her amputation site. I use cefdinir 300 twice daily for 10 days that started on 12/15. Her x-ray on the right showed limited evaluation for osteomyelitis. The findings could have been postoperative. There was subtle erosion in the distal first and distal fifth metatarsal. An MRI was suggested. On the left she had irregularity of the fourth middle and proximal phalanx consistent with a history of osteomyelitis. I do not know that she has a history of osteomyelitis in this area. She had a newly defined area on the plantar third toe 12/31; patient's MRI is listed below: MRI OF THE RIGHT FOREFOOT WITHOUT AND WITH CONTRAST TECHNIQUE: Multiplanar, multisequence MR imaging of the right forefoot was performed before and after the administration of  intravenous contrast. CONTRAST 6 mL GADAVIST IV SOLN : COMPARISON: Plain films right foot 09/04/2019. FINDINGS: Bones/Joint/Cartilage The patient is status post transmetatarsal amputation as seen on the prior exam. Marrow edema and enhancement are seen in all of the distal metatarsals. In the first metatarsal, signal change extends 1.5 cm proximal to the stump and in the second metatarsal extends approximately 2 cm proximal to the stump. Edema and enhancement are seen in only the distal 0.7 cm of the third metatarsal stump and tips of the fourth and fifth metatarsals. Bone marrow signal is otherwise unremarkable. A small focus of subchondral edema is seen in the lateral talus, likely degenerative. Ligaments Intact. Muscles and Tendons No intramuscular fluid collection. Soft tissues Skin ulceration is seen off the stump of the first metatarsal. A thin fluid collection tracks deep to the wound and over the anterior metatarsals worrisome for small abscess. Intense subcutaneous edema and enhancement are seen diffusely. 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 Cellulitis about the foot. Skin ulceration  over the distal first metatarsal is identified with a thin fluid collection tracking anteriorly along the stump worrisome for abscess. Small focus of subchondral edema in the lateral talus is likely degenerative. Electronically Signed By: Inge Rise M.D. On: 09/23/2019 15:25 Patient arrives in clinic today not complaining any of any pain. 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 She does not describe claudication however her activity is very limited. 10/01/2019; since the patient was last here I have communicated with Dr. Gwenlyn Found after bypass by Dr. Trula Slade and  addressing the right superficial femoral artery he states that she is widely patent through peroneal arteries to the ankle with collaterals to the dorsalis pedis. He states he is going to talk to colleagues about the feasibility of tibial pedal access. The patient seems infectious disease later this afternoon Dr. Baxter Flattery. in preparation for this she has been off antibiotics for 1 week and I went ahead and obtain pieces of the remanence of her first metatarsal for pathology and CandS. The patient is a candidate for hyperbaric oxygen. 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 1/15; considerable improvement in the wounds on her feet. We are using silver collagen. She follows with Dr. Baxter Flattery of infectious disease and is on meropenem and daptomycin. She has been taught how to do this herself at home. Follows with Dr. Graylon Good at the end of treatment here. She has 2 wounds on the surgical TMA site 1 lateral and 1 medial the lateral 1 has regressed tremendously. The area medially still has some exposed bone although the base of this looks healthy. 1/22; 2 separate wounds on the surgical TMA site. Both of these look satisfactory. The medial area does not have any exposed bone. This is an improvement On the left side fourth toe dorsally over the proximal phalanx there is a deep punched out area that probes to bone. She has an area on the tip of the left third toe. She also tells [place] that in HBO she traumatized her left Achilles and this is left her with a superficial wound area 1/28; weekly visit along with HBO. She has 5 wounds. T our punched out areas on the original TMA site on the right. Both of these appear to have contracted. o The area on the right no longer has exposed bone. She has an area on the tip of the left third toe and on the DIP of the left fourth toe. Both of these had surface callus that I removed and unfortunately they have small areas that both go  to bone. She has a traumatic wound on the left Achilles area that happened in HBO and that appears better. She is tolerating her IV antibiotics well at home. She has home health changing the dressings and she is doing it once on the weekends. We have been using silver collagen. She has been extensively revascularized on the right by Dr. Trula Slade and subsequently by Dr. Gwenlyn Found. 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 Baxter Flattery of infectious disease on 2/17. Dr. Gwenlyn Found on 2/23 and according to her her antibiotics stopped on 2/23. She is tolerating hyperbarics well. She has made a tremendous improvement in the right forefoot with only 2 smaller open wounds. Using silver collagen. On the left  foot she has the area on the tip of the left third toe and the medial part of the left fourth toe. These had exposed bone last week I did not sense any of that today 2/12; sees Dr. Graylon Good on 2/17 and Dr. Gwenlyn Found on 2/23. We are going to use Dermagraft on this today however the lateral part of her train TMA incision on the right is healed and the medial part is down so much that we just continue with the silver collagen She has wounds on the tip of her left third and the medial aspect of the left fourth. Both of these still have exposed bone I have not been able to get these to epithelialize. 2/19; she sees Dr. Gwenlyn Found on 2/23 and I have communicated with him about the left vascular supply. Looking at her angiogram from 08/03/2019 it looks as though that they could not cross the left CFA. Noted that she had one-vessel runoff bilaterally. She also apparently saw Dr. Graylon Good although I did not look up these results for preparation of this record. 2/26; Dr. Gwenlyn Found is going to do an angiogram next week on Wednesday. I think they are going to try to go at this both retrograde and anterograde to see  if anything can be done to revascularize the left lower limb. She continues to make nice progress on the remaining wound on the right medial foot on her TMA site and the toe it wounds on the left are responding nicely as well 3/12; the patient underwent an extensive and complicated revascularization on the left leg by Dr. Fletcher Anon on 3/3; she had an atherectomy on occluded left popliteal artery; anterior tibial artery followed by a balloon angioplasty. Atherectomy was also performed to the left SFA because there was still significant 50% stenosis in the left popliteal artery they performed an intravascular lithotripsy which improved the residual stenosis to 20%. The same lithotripsy was used to dilate the proximal left SFA. She had a drug-eluting stent placed I believe in the SFA. The patient returned for hyperbarics this week. She has had some eye problems on the right which she tells me are secondary to diabetic retinopathy and she saw her eye doctor. She is really made excellent progress there is no open wounds on the left foot at all. She has one open area medially and her TMA site on the right lateral wound is closed. 3/19; the patient comes in today with the area on the medial transmet on the right improved. Some debris removed from the surface revealed the still open wound. Unfortunately she now has the open area on the third toe tip and the medial aspect of the dorsal fourth toe. These are in the same location as her previous wounds. These had actually closed up last week. 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 She is undergone complex revascularizations on the right by Dr. Trula Slade and Dr. Gwenlyn Found and I believe Dr. Fletcher Anon worked on the tibial vessels on the left most recently.she had a complex transmetatarsal amputation wound with underlying osteomyelitis on presentation to the clinic and then developed deep wounds on the plantar left third toe tip and  on the medial part of the left fourth toe at the PIP. She completed IVantibiotics as directed by infectious disease and I believe a course of oral antibiotics as well.she underwent a complete 40 treatments of hyperbarics. She comes in today with the vast majority of the right TMA site healed there is  a small opening on the most medial aspect. Her left third and left fourth toes appear to have epithelialization although this has fluctuated somewhat. 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 She had a gaping wound on the right transmetatarsal amputation L as well as deep wounds on the left third and left fourth toes. She underwent complex revascularizations on her bilateral lower legs which I described on her last visit. She completed IV antibiotics by infectious disease and 40 treatments of hyperbarics. Her left third and fourth toes are healed. The area on the right transmetatarsal amputation site is also closed today. READMISSION 08/04/2020 [name] is now a 81 year old woman that we had for a complex stay in our clinic from October 2000 14 January 2020. 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 Her current problems with wounds began on October 30 when she developed a spontaneous opening over the right transmet medial aspect over the first metatarsal head. She has been using collagen on this that she had leftover from her last stay in this clinic. She also has had an open area on the medial right calf from a vein harvest site from her CABG she. She said that this is never really closed since the vein harvest was done. With regards to her arterial status this is a major issue. She had an angiogram by Dr. Gwenlyn Found in September 2020. She developed a gangrenous right first and second toes. Ultimately she was referred to Dr. Trula Slade who did a right  common femoral endarterectomy with patch angioplasty. This was on 07/21/2019 and she really had a good result. Dr. Gwenlyn Found did a atherectomy followed by a drug coated balloon angioplasty of her right SFA popliteal and tibial peroneal trunk on 08/03/2019 again with excellent excellent results. Follow-up.Dopplers in November revealed revealed a widely patent SFA and tibioperoneal trunk. UNFORTUNATELY her recent angiogram that was done on 08/01/2020 showed an 80% right common femoral artery stenosis just above the previous endarterectomy site occluded right right SFA at the origin reconstituting the adductor canal by the profunda femoris collaterals. The profunda femoris is also diffusely diseased. There is and again another 80% tibioperoneal trunk stenosis and one-vessel runoff via the peroneal artery. She is seen Dr. Donzetta Matters and she is scheduled for a femoral endarterectomy and right femoral-popliteal bypass grafting with endarterectomy of her tibial peroneal trunk. The scheduled surgery is on 11/23 The patient does not complain of pain at rest. She does have 5-minute walking claudication. The wounds on her right first met head remanent was spontaneous she does not think she hit this. As mentioned in the area on her right medial calf is a vein harvest site Dr. Gwenlyn Found had texted me about getting her in the clinic and we have arranged this as soon as we can. I have told her that we will have to see how successful Dr. Donzetta Matters is before we know what can be done about the wounds in a precise fashion. Fortunately there does not seem to be any obvious infection involving the right foot although that may need to be looked at if things really deteriorate. She was treated with IV antibiotics and hyperbaric oxygen for her previous osteomyelitis 12/2; the patient underwent a right femoral endarterectomy and bypass femoral to popliteal artery. This was done on 08/16/2020. She is done remarkably well. She has a  small area on  the right anterior tibia and a small area in the medial part of her original right transmetatarsal amputation. She appears to have had an excellent response to surgery. 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 Mumpower is a now 81 year old woman who arrives for review of wounds on the left TMA site extending into her plantar foot as well as an area on the left posterior calcaneus. 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 She was extensively revascularized on the right at that time as noted above in our previous notes ended up receiving IV antibiotics and hyperbaric oxygen therapy. Miraculously this TMA site actually closed and she really has had not any trouble with this since. UNFORTUNATELY the same cannot be said of her left side. Her problem apparently started sometime in July when she hit her left third toe on a barstool while playing pool. She developed an acute infection which eventually led to underlying osteomyelitis. She underwent an extensive hospitalization from 04/27/2021 through 05/15/2021. She received IV antibiotics. Also extensive intervention by Dr. Stanford Breed of vein and vascular which included a left common femoral to tibioperoneal trunk bypass as well as a left third toe amputation on 05/03/2021. Unfortunately the area did not heal she ended up requiring a left TMA. By her description this was left open. She has been using a wound VAC on this to earlier this week and then was switched to Santyl. I think because of the left forefoot off loader she is also developed an area on the left posterior calcaneus. She is not complaining of a lot of pain. She has her daughter at home to change the dressings. She has been prescribed Santyl and she has a tube that she is brought  into our clinic. I cannot get a lot of history of claudication although she is really not that mobile at this point walking with a walker. 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 She has had multiple interventions on the right side by vein and vascular and also by Dr. Gwenlyn Found as well as a transmetatarsal amputation on the right. She has had follow-up noninvasive studies on 06/06/2021; on the left that showed an ABI of 0.47 with monophasic waveforms a dorsalis pedis ABI of 0.40 with monophasic waveforms. Her ANGIOGRAM which was initially done by Dr. Oneida Alar on 04/28/2021 showed severe left foot superficial femoral artery occlusive disease and an occlusion of the left popliteal artery with two-vessel runoff to the left foot with moderate to severe disease below the knee popliteal artery and tibial disease. She had 80% distal right external iliac artery above the existing femoral above-knee popliteal bypass but she does not have an open wound on the right foot She arrives in clinic with an extensive open area on the TMA site which in the mid aspect extends into the plantar foot. She also has a more superficial area on the upper part of her Achilles area of the left heel 9/29; patient arrives in clinic today with slough over the TMA site. We use MolecuLight to look at the surface of this suggesting cyan and white discoloration over a large part of this wound also extending into  the third metatarsal area. She also has the area on the left posterior calcaneus. This is slough covered we will switch to [facility] here as well. There is some warmth in the area and some erythema we will give her antibiotics as well 10/6; completely necrotic surface once again on the left TMA site. We have been using Santyl and Hydrofera Blue. She has an area on the left posterior Achilles and  actually states this has been more painful this week indeed there is some erythema around this area. She also describes I think claudication at rest which is relieved by dipping her left leg over the side of the bed at night. 10/13; our intake nurse was able to brush some slough off the surface of the left TMA hence this did not require debridement that is better than last week. The area heading towards the plantar part of her foot required an aggressive debridement to clean this up. This is quite a sizable divot but does not go to bone. The area on the right Achilles heel is also covered in a high 100% nonviable tissue we have been using Santyl and silver alginate 10/20; the area on the left TMA looks much better. Even the area is spreading into the midfoot looks improved. The right Achilles still is covered and nonviable surface 100%. We have been using Santyl and silver alginate and this will continue for this week The patient is approved for 5 Apligraf's and I think she is good enough to start using these next week 10/27; the patient's wounds actually look quite good. We have been using Santyl and silver alginate. We applied Apligraf #1 today Saw Dr. Stanford Breed her vascular surgeon on 10/25 he told her TMA was healing nicely. She will follow-up with them in December to repeat noninvasive testing. 08/16/2021 upon evaluation today patient appears to be doing much better in regard to her wounds. Both are showing signs of improvement which is great news. I am going to perform some debridement to clear away some of the necrotic debris currently this will just be a light debridement. Nonetheless other than that I feel like that the Apligraf is doing quite well working and reapply today and this will be #3 as far as the application is concerned. 12/20; since the patient was last here she was seen by Dr. Stanford Breed who felt that her Doppler flow was sluggish in the bypass. She therefore went underwent a urgent  angiogram on 09/07/2021 by Dr. Carlis Abbott. She had an angioplasty of the distal anastomosis and the anterior tibial artery. She tolerated this well. She had no flow-limiting stenosis in the aortoiliac segment on the left. Some calcification in the left common iliac artery but it was not flow-limiting the common femoral and profunda were patent. The left lower extremity bypass had brisk flow with no significant proximal anastomosis on the common femoral artery. Distally there was a high-grade stenosis where the bypass anastomosis was performed. This was angioplastied as well as the anterior tibial artery. 12/28; the patient comes in early because of a wound over the tip of her right first metatarsal head. She is not really sure how this happened. Says she noticed it when she took her sock off on Friday before she got in the shower. She has been using Hydrofera Blue to this area. This is the original wounded site we dealt with in the setting of a previous right TMA. The patient has PA I researched this on [place] link. As it turns out  she has had recent arterial studies noninvasive on 09/05/2021. On the right she had dampened monophasic waveforms at the popliteal absent waveforms at the PTA and dorsalis pedis. An ABI could not be obtained previously at 0.47. The comment states that they were unable to detect Doppler waveforms of the posterior tibial and dorsalis pedis arteries. 2023 09/26/2021; her original left TMA wound is almost closed. I reapplied Apligraf to the remaining area. The area on the plantar foot is also closed as far as I can tell HOWEVER she comes in with a second wound on the right first metatarsal head remanent both of these probe to bone. I did a bone scraping of this last week which only showed Corynebacterium unlikely to be a true pathogen. Nevertheless I am going to put her on Augmentin today probably for 2 weeks. X-ray will be done today. She has an appointment with Dr. Gwenlyn Saran at [facility] vascular next Tuesday I will send him a note about my concerns about the right TM 1/10; we inadvertently remove the Apligraf from the left surgical site however everything looks closed here. She still has 2 probing wounds over the remanent of her first metatarsal head both go to bone although there is less erythema and swelling in the area. She does not have an appointment with Dr. Donzetta Matters until next Tuesday. I am going to go ahead and order an MRI of the right foot I had originally planned to have or at least consider HBO if the patient has underlying osteomyelitis in the right foot however she tells me that she lost a significant amount of her visual field during her last run with HBO in the right eye nasal aspect. She saw Dr. Zadie Rhine who is a retinal specialist I will see if I can look at his records. She is still on Augmentin as finishes on Monday. I will see if she has had the MRI before considering additional antibiotics on Tuesday Electronic Signature(s) Signed: 10/03/2021 4:28:08 PM By: [name] MD Entered By: [name] on 10/03/2021 11:27:54 -------------------------------------------------------------------------------- Physical Exam Details Patient Name: Date of Service: [name], [place]. 10/03/2021 9:30 A M Medical Record Number: [id number] Patient Account Number: [id number] Date of Birth/Sex: Treating RN: 1941/07/27 (81 y.o. [name] Primary Care Provider: [name] Other Clinician: Referring Provider: Treating Provider/[name]: Tressie Stalker in Treatment: 15 Constitutional Sitting or standing Blood Pressure is within target range for patient.. Pulse regular and within target range for patient.Marland Kitchen Respirations regular, non-labored and within target range.. Temperature is normal and within the target range for the patient.Marland Kitchen Appears in no distress. Notes Wound exam; as mentioned we remove the Apligraf thoroughly from her  surgical site on the left TMA however I cannot identify any wounded area. There is a divot going into the center part of the wound at roughly the third metatarsal head but opening this there is nothing but epithelialization. The major areas on the right first metatarsal head remanent 2 areas that probes to bone. My original culture of this area was negative Electronic Signature(s) Signed: 10/03/2021 4:28:08 PM By: [name] MD Entered By: [name] on 10/03/2021 11:29:05 -------------------------------------------------------------------------------- Physician Orders Details Patient Name: Date of Service: [name], [name] 10/03/2021 9:30 A M Medical Record Number: [id number] Patient Account Number: [id number] Date of Birth/Sex: Treating RN: 1941-08-20 (81 y.o. [name], [name] Primary Care Provider: [name] Other Clinician: Referring Provider: Treating Provider/[name]: Tressie Stalker in Treatment: 15 Verbal /  Phone Orders: No Diagnosis Coding Follow-up Appointments ppointment in 1 week. - with Dr. Dellia Nims Return A Bathing/ Shower/ Hygiene May shower with protection but do not get wound dressing(s) wet. Edema Control - Lymphedema / SCD / Other Elevate legs to the level of the heart or above for 30 minutes daily and/or when sitting, a frequency of: - throughout the day Avoid standing for long periods of time. Home Health New wound care orders this week; continue Home Health for wound care. May utilize formulary equivalent dressing for wound treatment orders unless otherwise specified. Dressing changes to be completed by [place] on Monday / Wednesday / Friday except when patient has scheduled visit at [facility]. Other Home Health Orders/Instructions: - [facility] Wound Treatment Wound #13 - Amputation Site - Transmetatarsal Wound Laterality: Left Cleanser: Wound Cleanser (Home Health) 2 x Per Week/15 Days Discharge  Instructions: Cleanse the wound with wound cleanser prior to applying a clean dressing using gauze sponges, not tissue or cotton balls. Prim Dressing: Promogran Prisma Matrix, 4.34 (sq in) (silver collagen) (Home Health) 2 x Per Week/15 Days ary Discharge Instructions: Moisten collagen with saline or hydrogel Secondary Dressing: Woven Gauze Sponge, Non-Sterile 4x4 in (Home Health) 2 x Per Week/15 Days Discharge Instructions: Apply over primary dressing as directed. Secondary Dressing: ABD Pad, 5x9 (Home Health) 2 x Per Week/15 Days Discharge Instructions: Apply over primary dressing as directed. Secured With: [employer], 4.5x3.1 (in/yd) (Home Health) 2 x Per Week/15 Days Discharge Instructions: Secure with Kerlix as directed. Secured With: Paper Tape, 2x10 (in/yd) (Home Health) 2 x Per Week/15 Days Discharge Instructions: Secure dressing with tape as directed. Wound #14 - Calcaneus Wound Laterality: Left, Posterior Cleanser: Wound Cleanser (Home Health) 2 x Per Week/15 Days Discharge Instructions: Cleanse the wound with wound cleanser prior to applying a clean dressing using gauze sponges, not tissue or cotton balls. Prim Dressing: Promogran Prisma Matrix, 4.34 (sq in) (silver collagen) 2 x Per Week/15 Days ary Discharge Instructions: Moisten collagen with saline or hydrogel Secondary Dressing: Woven Gauze Sponge, Non-Sterile 4x4 in (Home Health) 2 x Per Week/15 Days Discharge Instructions: Apply over primary dressing as directed. Secondary Dressing: ABD Pad, 5x9 (Home Health) 2 x Per Week/15 Days Discharge Instructions: Apply over primary dressing as directed. Secured With: [employer], 4.5x3.1 (in/yd) (Home Health) 2 x Per Week/15 Days Discharge Instructions: Secure with Kerlix as directed. Secured With: Paper Tape, 2x10 (in/yd) (Home Health) 2 x Per Week/15 Days Discharge Instructions: Secure dressing with tape as directed. Wound #15 - Amputation Site - Transmetatarsal  Wound Laterality: Right Cleanser: Soap and Water [facility]) Every Other Day/30 Days Discharge Instructions: May shower and wash wound with dial antibacterial soap and water prior to dressing change. Prim Dressing: Promogran Prisma Matrix, 4.34 (sq in) (silver collagen) (Home Health) Every Other Day/30 Days ary Discharge Instructions: Moisten collagen with [place] Jelly Secondary Dressing: Woven Gauze Sponges 2x2 in [facility]) Every Other Day/30 Days Discharge Instructions: Apply over primary dressing as directed. Secured With: [employer], 4.5x3.1 (in/yd) [facility]) Every Other Day/30 Days Discharge Instructions: Secure with Kerlix as directed. Secured With: Transpore Surgical Tape, 2x10 (in/yd) [facility]) Every Other Day/30 Days Discharge Instructions: Secure dressing with tape as directed. Secured With: Netting Every Other Day/30 Days Wound #16 - Foot Wound Laterality: Plantar, Right Cleanser: Soap and Water [facility]) Every Other Day/30 Days Discharge Instructions: May shower and wash wound with dial antibacterial soap and water prior to dressing change. Prim Dressing: Promogran  Prisma Matrix, 4.34 (sq in) (silver collagen) (Home Health) Every Other Day/30 Days ary Discharge Instructions: Moisten collagen with [place] Jelly Secondary Dressing: Woven Gauze Sponges 2x2 in [facility]) Every Other Day/30 Days Discharge Instructions: Apply over primary dressing as directed. Secured With: [employer], 4.5x3.1 (in/yd) [facility]) Every Other Day/30 Days Discharge Instructions: Secure with Kerlix as directed. Secured With: Transpore Surgical Tape, 2x10 (in/yd) [facility]) Every Other Day/30 Days Discharge Instructions: Secure dressing with tape as directed. Secured With: Netting Every Other Day/30 Days Radiology Magnetic Resonance Imaging (MRI) - W/ and W/O CONTRAST RIGHT FOOT ICD 10: CPT ; . : Electronic Signature(s) Signed: 10/03/2021 4:28:08 PM By: [name] MD Signed: 10/03/2021 4:42:21 PM By: Rhae Hammock RN Entered By: Rhae Hammock on 10/03/2021 10:47:56 Prescription 10/03/2021 -------------------------------------------------------------------------------- Adella Nissen MD Patient Name: Provider: 1941-05-12 [id number] Date of Birth: NPI#Wanda Plump [id number] Sex: DEA #: [phone] [id number] Phone #: License #: [place] Patient Address: [address], [place] [address] [place], [place] [address] [phone] Allergies Demerol; scopolamine; Bactrim; Versed; tramadol Provider's Orders Magnetic Resonance Imaging (MRI) - W/ and W/O CONTRAST RIGHT FOOT ICD 10: CPT ; . : Hand Signature: Date(s): Electronic Signature(s) Signed: 10/03/2021 4:28:08 PM By: [name] MD Signed: 10/03/2021 4:42:21 PM By: Rhae Hammock RN Entered By: Rhae Hammock on 10/03/2021 10:47:59 -------------------------------------------------------------------------------- Problem List Details Patient Name: Date of Service: [name], [place]. 10/03/2021 9:30 A M Medical Record Number: [id number] Patient Account Number: [id number] Date of Birth/Sex: Treating RN: Dec 03, 1940 (81 y.o. [name] Primary Care Provider: [name] Other Clinician: Referring Provider: Treating Provider/[name]: Tressie Stalker in Treatment: 15 Active Problems ICD-10 Encounter Code Description Active Date MDM Diagnosis E11.621 Type 2 diabetes mellitus with foot ulcer 06/15/2021 No Yes T81.31XD Disruption of external operation (surgical) wound, not elsewhere classified, 06/15/2021 No Yes subsequent encounter L97.514 Non-pressure chronic ulcer of other part of right foot with necrosis of bone 09/20/2021 No Yes E11.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 06/15/2021 No Yes L97.528  Non-pressure chronic ulcer of other part of left foot with other specified 06/15/2021 No Yes severity L97.428 Non-pressure chronic ulcer of left heel and midfoot with other specified 06/15/2021 No Yes severity Inactive Problems Resolved Problems Electronic Signature(s) Signed: 10/03/2021 4:28:08 PM By: [name] MD Entered By: [name] on 10/03/2021 11:24:50 -------------------------------------------------------------------------------- Progress Note Details Patient Name: Date of Service: [name], [name] 10/03/2021 9:30 A M Medical Record Number: [id number] Patient Account Number: [id number] Date of Birth/Sex: Treating RN: [birth year] (81 y.o. [name] Primary Care Provider: [name] Other Clinician: Referring Provider: Treating Provider/[name]: Tressie Stalker in Treatment: 15 Subjective History of Present Illness (HPI) ADMISSION 07/13/2019 Patient is a 81 year old type II diabetic. She has known PAD. She has been followed by Dr. Jacqualyn Posey of podiatry for blistering areas on her toes dating back to 04/30/2019 which at this was the left fourth toe. By 8/11 she had wounds on the right and left second toes. She underwent arterial studies by Dr. Alvester Chou on 7/31 that showed ABIs on the right of 0.60 TBI of 0.26 on the left ABI of 0.56 and a TBI of 0.25. By 9/10 she had ischemic-looking wounds per Dr. Earleen Newport on the right first, left first second and third. She has been using Medihoney and then mupirocin more recently simply Betadine. The patient underwent angiography  by Dr. Gwenlyn Found on 9/21. 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 Noted that there was 1 vessel runoff in the foot via the peroneal. ooOn the left there was an 80% left SFA, 70% mid left SFA. There was a short segment calcified CTO distal less than SFA and  above-knee popliteal artery reconstituting with two-vessel runoff. 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 It was felt that she had bilateral SFA disease as well as tibial vessel disease. An attempt was made to revascularize the left SFA but they were unable to cross because of the highly calcified nature of the lesion. 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 She has an area on the medial left fourth toe with raised horned callus on top of this. I am not certain what this represents. With regards to pain she has about a 2-1/2 I will claudication tolerance in the grocery store. She has some pain in night which is relieved by putting her feet down over the side of the bed. She is wearing Nitro-Dur patches on the top of her right foot. Past medical history includes type 2 diabetes with secondary PAD, neoplasm of the skin, diabetic retinopathy, carotid artery stenosis, hyperlipidemia hypertension. 11/2; the last time the patient was here I spoke to Dr. Gwenlyn Found about revascularization options on the right. As I understand things currently Dr. Gwenlyn Found spoke with Dr. Trula Slade and ultimately the patient was taken to surgery on 10/27. She had a right iliofemoral endarterectomy with a bovine patch angioplasty. I think the plan now is for her to have a staged intervention on the right SFA by Dr. Alvester Chou. Per the patient's understanding Dr. Gwenlyn Found and Dr. Jacqualyn Posey are waiting to see when the gangrenous toes on the right foot can be amputated. The patient states her pain is a lot better and she is grateful for that. 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 Small area on the left fourth toe. She is using Betadine to the mummified areas on the right and Medihoney on the left 08/27/2019; the last time I saw this patient I discharged her from the clinic. She had  been revascularized by Dr. Trula Slade and she had a right iliofemoral endarterectomy with a bovine angioplasty. 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 Jacqualyn Posey of podiatry on 08/07/2019. I note that she also had a intervention by Dr. Gwenlyn Found and he performed a directional atherectomy and drug-coated balloon angioplasty of the SFA and popliteal artery on the right. I am not certain of the date of Dr. Kennon Holter procedure as of the time of this dictation. She was referred back to [place] by Dr. Earleen Newport predominantly for follow-up of the left fourth toe. She still has sutures and stitches in the right TMA site. She states her pain is a lot better. She expresses concern about the condition of the amputation site at the TMA. She is on doxycycline I think prescribed by Dr. Earleen Newport. She is complaining of some pain at night 12/10; I spoke to Dr. Jacqualyn Posey last week. He removed the sutures on the right foot on Monday of this week. She has the area on the left fourth toe just proximal to the PIP and  then the right TMA site. She is still on doxycycline and has enough through next week. Unfortunately the TMA site does not look good at all. Both on the lateral and medial part of the incisions are areas that probe to bone. There is purulence over the medial part which I have cultured. We will use silver alginate. Left fourth toe looks somewhat better but there was still exposed bone 12/17; patient has an MRI booked for 12/30. Culture I did last week showed Pseudomonas Serratia and Enterococcus. This was purulent drainage coming out of the medial part of her amputation site. I use cefdinir 300 twice daily for 10 days that started on 12/15. Her x-ray on the right showed limited evaluation for osteomyelitis. The findings could have been postoperative. There was subtle erosion in the distal first and distal fifth metatarsal. An MRI was suggested. On the left she had irregularity of the  fourth middle and proximal phalanx consistent with a history of osteomyelitis. I do not know that she has a history of osteomyelitis in this area. She had a newly defined area on the plantar third toe 12/31; patient's MRI is listed below: MRI OF THE RIGHT FOREFOOT WITHOUT AND WITH CONTRAST TECHNIQUE: Multiplanar, multisequence MR imaging of the right forefoot was performed before and after the administration of intravenous contrast. CONTRAST 6 mL GADAVIST IV SOLN : COMPARISON: Plain films right foot 09/04/2019. FINDINGS: Bones/Joint/Cartilage The patient is status post transmetatarsal amputation as seen on the prior exam. Marrow edema and enhancement are seen in all of the distal metatarsals. In the first metatarsal, signal change extends 1.5 cm proximal to the stump and in the second metatarsal extends approximately 2 cm proximal to the stump. Edema and enhancement are seen in only the distal 0.7 cm of the third metatarsal stump and tips of the fourth and fifth metatarsals. Bone marrow signal is otherwise unremarkable. A small focus of subchondral edema is seen in the lateral talus, likely degenerative. Ligaments Intact. Muscles and Tendons No intramuscular fluid collection. Soft tissues Skin ulceration is seen off the stump of the first metatarsal. A thin fluid collection tracks deep to the wound and over the anterior metatarsals worrisome for small abscess. Intense subcutaneous edema and enhancement are seen diffusely. 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 Cellulitis about the foot. Skin ulceration over the distal first metatarsal is identified with a thin fluid collection tracking anteriorly along the stump worrisome for abscess. Small focus of subchondral edema in the lateral talus is likely degenerative. Electronically  Signed By: Inge Rise M.D. On: 09/23/2019 15:25 Patient arrives in clinic today not complaining any of any pain. 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 She does not describe claudication however her activity is very limited. 10/01/2019; since the patient was last here I have communicated with Dr. Gwenlyn Found after bypass by Dr. Trula Slade and addressing the right superficial femoral artery he states that she is widely patent through peroneal arteries to the ankle with collaterals to the dorsalis pedis. He states he is going to talk to colleagues about the feasibility of tibial pedal access. The patient seems infectious disease later this afternoon Dr. Baxter Flattery. in preparation for this she has been off antibiotics for 1 week and I went ahead and obtain pieces of the remanence of her first metatarsal for pathology and CandS. The patient  is a candidate for hyperbaric oxygen. 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 1/15; considerable improvement in the wounds on her feet. We are using silver collagen. She follows with Dr. Baxter Flattery of infectious disease and is on meropenem and daptomycin. She has been taught how to do this herself at home. Follows with Dr. Graylon Good at the end of treatment here. She has 2 wounds on the surgical TMA site 1 lateral and 1 medial the lateral 1 has regressed tremendously. The area medially still has some exposed bone although the base of this looks healthy. 1/22; 2 separate wounds on the surgical TMA site. Both of these look satisfactory. The medial area does not have any exposed bone. This is an improvement On the left side fourth toe dorsally over the proximal phalanx there is a deep punched out area that probes to bone. She has an area on the tip of the left third toe. She also tells [place] that in HBO she traumatized her left Achilles and this is left her with  a superficial wound area 1/28; weekly visit along with HBO. She has 5 wounds. T our punched out areas on the original TMA site on the right. Both of these appear to have contracted. o The area on the right no longer has exposed bone. She has an area on the tip of the left third toe and on the DIP of the left fourth toe. Both of these had surface callus that I removed and unfortunately they have small areas that both go to bone. She has a traumatic wound on the left Achilles area that happened in HBO and that appears better. She is tolerating her IV antibiotics well at home. She has home health changing the dressings and she is doing it once on the weekends. We have been using silver collagen. She has been extensively revascularized on the right by Dr. Trula Slade and subsequently by Dr. Gwenlyn Found. 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 Baxter Flattery of infectious disease on 2/17. Dr. Gwenlyn Found on 2/23 and according to her her antibiotics stopped on 2/23. She is tolerating hyperbarics well. She has made a tremendous improvement in the right forefoot with only 2 smaller open wounds. Using silver collagen. On the left foot she has the area on the tip of the left third toe and the medial part of the left fourth toe. These had exposed bone last week I did not sense any of that today 2/12; sees Dr. Graylon Good on 2/17 and Dr. Gwenlyn Found on 2/23. We are going to use Dermagraft on this today however the lateral part of her train TMA incision on the right is healed and the medial part is down so much that we just continue with the silver collagen She has wounds on the tip of her left third and the medial aspect of the left fourth. Both of these still have exposed bone I have not been able to get these to epithelialize. 2/19; she sees Dr. Gwenlyn Found on 2/23 and I have communicated with him about the left  vascular supply. Looking at her angiogram from 08/03/2019 it looks as though that they could not cross the left CFA. Noted that she had one-vessel runoff bilaterally. She also apparently saw Dr. Graylon Good although I did not look up these results for preparation of this record. 2/26;  Dr. Gwenlyn Found is going to do an angiogram next week on Wednesday. I think they are going to try to go at this both retrograde and anterograde to see if anything can be done to revascularize the left lower limb. She continues to make nice progress on the remaining wound on the right medial foot on her TMA site and the toe it wounds on the left are responding nicely as well 3/12; the patient underwent an extensive and complicated revascularization on the left leg by Dr. Fletcher Anon on 3/3; she had an atherectomy on occluded left popliteal artery; anterior tibial artery followed by a balloon angioplasty. Atherectomy was also performed to the left SFA because there was still significant 50% stenosis in the left popliteal artery they performed an intravascular lithotripsy which improved the residual stenosis to 20%. The same lithotripsy was used to dilate the proximal left SFA. She had a drug-eluting stent placed I believe in the SFA. The patient returned for hyperbarics this week. She has had some eye problems on the right which she tells me are secondary to diabetic retinopathy and she saw her eye doctor. She is really made excellent progress there is no open wounds on the left foot at all. She has one open area medially and her TMA site on the right lateral wound is closed. 3/19; the patient comes in today with the area on the medial transmet on the right improved. Some debris removed from the surface revealed the still open wound. ooUnfortunately she now has the open area on the third toe tip and the medial aspect of the dorsal fourth toe. These are in the same location as her previous wounds. These had actually closed up last  week. 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 She is undergone complex revascularizations on the right by Dr. Trula Slade and Dr. Gwenlyn Found and I believe Dr. 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 She completed IVantibiotics as directed by infectious disease and I believe a course of oral antibiotics as well.she underwent a complete 40 treatments of hyperbarics. She comes in today with the vast majority of the right TMA site healed there is a small opening on the most medial aspect. Her left third and left fourth toes appear to have epithelialization although this has fluctuated somewhat. 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 She had a gaping wound on the right transmetatarsal amputation L as well as deep wounds on the left third and left fourth toes. She underwent complex revascularizations on her bilateral lower legs which I described on her last visit. She completed IV antibiotics by infectious disease and 40 treatments of hyperbarics. Her left third and fourth toes are healed. The area on the right transmetatarsal amputation site is also closed today. READMISSION 08/04/2020 [name] is now a 81 year old woman that we had for a complex stay in our clinic from October 2000 14 January 2020. 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 Her current problems with wounds began on October 30 when she developed a spontaneous opening over the right transmet medial  aspect over the first metatarsal head. She has been using collagen on this that she had leftover from  her last stay in this clinic. She also has had an open area on the medial right calf from a vein harvest site from her CABG she. She said that this is never really closed since the vein harvest was done. With regards to her arterial status this is a major issue. She had an angiogram by Dr. Gwenlyn Found in September 2020. She developed a gangrenous right first and second toes. Ultimately she was referred to Dr. Trula Slade who did a right common femoral endarterectomy with patch angioplasty. This was on 07/21/2019 and she really had a good result. Dr. Gwenlyn Found did a atherectomy followed by a drug coated balloon angioplasty of her right SFA popliteal and tibial peroneal trunk on 08/03/2019 again with excellent excellent results. Follow-up.Dopplers in November revealed revealed a widely patent SFA and tibioperoneal trunk. UNFORTUNATELY her recent angiogram that was done on 08/01/2020 showed an 80% right common femoral artery stenosis just above the previous endarterectomy site occluded right right SFA at the origin reconstituting the adductor canal by the profunda femoris collaterals. The profunda femoris is also diffusely diseased. There is and again another 80% tibioperoneal trunk stenosis and one-vessel runoff via the peroneal artery. She is seen Dr. Donzetta Matters and she is scheduled for a femoral endarterectomy and right femoral-popliteal bypass grafting with endarterectomy of her tibial peroneal trunk. The scheduled surgery is on 11/23 The patient does not complain of pain at rest. She does have 5-minute walking claudication. The wounds on her right first met head remanent was spontaneous she does not think she hit this. As mentioned in the area on her right medial calf is a vein harvest site Dr. Gwenlyn Found had texted me about getting her in the clinic and we have arranged this as soon as we can. I have told her that we will have to see how successful Dr. Donzetta Matters is before we know what can be done about the wounds in a precise  fashion. Fortunately there does not seem to be any obvious infection involving the right foot although that may need to be looked at if things really deteriorate. She was treated with IV antibiotics and hyperbaric oxygen for her previous osteomyelitis 12/2; the patient underwent a right femoral endarterectomy and bypass femoral to popliteal artery. This was done on 08/16/2020. She is done remarkably well. She has a small area on the right anterior tibia and a small area in the medial part of her original right transmetatarsal amputation. She appears to have had an excellent response to surgery. 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 Drinkard is a now 81 year old woman who arrives for review of wounds on the left TMA site extending into her plantar foot as well as an area on the left posterior calcaneus. 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 She was extensively revascularized on the right at that time as noted above in our previous notes ended up receiving IV antibiotics and hyperbaric oxygen therapy. Miraculously this TMA site actually closed and she really has had not any trouble with this since. UNFORTUNATELY the same cannot be said of her left side. Her problem apparently started sometime in July when she hit her left third toe on a barstool  while playing pool. She developed an acute infection which eventually led to underlying osteomyelitis. She underwent an extensive hospitalization from 04/27/2021 through 05/15/2021. She received IV antibiotics. Also extensive intervention by Dr. Stanford Breed of vein and vascular which included a left common femoral to tibioperoneal trunk bypass as well as a left third toe amputation on 05/03/2021. Unfortunately the area did not heal she ended  up requiring a left TMA. By her description this was left open. She has been using a wound VAC on this to earlier this week and then was switched to Santyl. I think because of the left forefoot off loader she is also developed an area on the left posterior calcaneus. She is not complaining of a lot of pain. She has her daughter at home to change the dressings. She has been prescribed Santyl and she has a tube that she is brought into our clinic. I cannot get a lot of history of claudication although she is really not that mobile at this point walking with a walker. 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 She has had multiple interventions on the right side by vein and vascular and also by Dr. Gwenlyn Found as well as a transmetatarsal amputation on the right. She has had follow-up noninvasive studies on 06/06/2021; on the left that showed an ABI of 0.47 with monophasic waveforms a dorsalis pedis ABI of 0.40 with monophasic waveforms. Her ANGIOGRAM which was initially done by Dr. Oneida Alar on 04/28/2021 showed severe left foot superficial femoral artery occlusive disease and an occlusion of the left popliteal artery with two-vessel runoff to the left foot with moderate to severe disease below the knee popliteal artery and tibial disease. She had 80% distal right external iliac artery above the existing femoral above-knee popliteal bypass but she does not have an open wound on the right foot She arrives in clinic with an extensive open area on the TMA site which in the mid aspect extends into the plantar foot. She also has a more superficial area on the upper part of her Achilles area of the left heel 9/29; patient arrives in clinic today with slough over the TMA site. We use MolecuLight to look at the surface of this suggesting cyan and white discoloration over a large  part of this wound also extending into the third metatarsal area. She also has the area on the left posterior calcaneus. This is slough covered we will switch to [facility] here as well. There is some warmth in the area and some erythema we will give her antibiotics as well 10/6; completely necrotic surface once again on the left TMA site. We have been using Santyl and Hydrofera Blue. She has an area on the left posterior Achilles and actually states this has been more painful this week indeed there is some erythema around this area. She also describes I think claudication at rest which is relieved by dipping her left leg over the side of the bed at night. 10/13; our intake nurse was able to brush some slough off the surface of the left TMA hence this did not require debridement that is better than last week. The area heading towards the plantar part of her foot required an aggressive debridement to clean this up. This is quite a sizable divot but does not go to bone. The area on the right Achilles heel is also covered in a  high 100% nonviable tissue we have been using Santyl and silver alginate 10/20; the area on the left TMA looks much better. Even the area is spreading into the midfoot looks improved. The right Achilles still is covered and nonviable surface 100%. We have been using Santyl and silver alginate and this will continue for this week The patient is approved for 5 Apligraf's and I think she is good enough to start using these next week 10/27; the patient's wounds actually look quite good. We have been using Santyl and silver alginate. We applied Apligraf #1 today Saw Dr. Stanford Breed her vascular surgeon on 10/25 he told her TMA was healing nicely. She will follow-up with them in December to repeat noninvasive testing. 08/16/2021 upon evaluation today patient appears to be doing much better in regard to her wounds. Both are showing signs of improvement which is great news. I am going to perform  some debridement to clear away some of the necrotic debris currently this will just be a light debridement. Nonetheless other than that I feel like that the Apligraf is doing quite well working and reapply today and this will be #3 as far as the application is concerned. 12/20; since the patient was last here she was seen by Dr. Stanford Breed who felt that her Doppler flow was sluggish in the bypass. She therefore went underwent a urgent angiogram on 09/07/2021 by Dr. Carlis Abbott. She had an angioplasty of the distal anastomosis and the anterior tibial artery. She tolerated this well. She had no flow-limiting stenosis in the aortoiliac segment on the left. Some calcification in the left common iliac artery but it was not flow-limiting the common femoral and profunda were patent. The left lower extremity bypass had brisk flow with no significant proximal anastomosis on the common femoral artery. Distally there was a high-grade stenosis where the bypass anastomosis was performed. This was angioplastied as well as the anterior tibial artery. 12/28; the patient comes in early because of a wound over the tip of her right first metatarsal head. She is not really sure how this happened. Says she noticed it when she took her sock off on Friday before she got in the shower. She has been using Hydrofera Blue to this area. This is the original wounded site we dealt with in the setting of a previous right TMA. The patient has PA I researched this on [facility] link. As it turns out she has had recent arterial studies noninvasive on 09/05/2021. On the right she had dampened monophasic waveforms at the popliteal absent waveforms at the PTA and dorsalis pedis. An ABI could not be obtained previously at 0.47. The comment states that they were unable to detect Doppler waveforms of the posterior tibial and dorsalis pedis arteries. 2023 09/26/2021; her original left TMA wound is almost closed. I reapplied Apligraf to the remaining  area. The area on the plantar foot is also closed as far as I can tell HOWEVER she comes in with a second wound on the right first metatarsal head remanent both of these probe to bone. I did a bone scraping of this last week which only showed Corynebacterium unlikely to be a true pathogen. Nevertheless I am going to put her on Augmentin today probably for 2 weeks. X-ray will be done today. She has an appointment with Dr. Gwenlyn Saran at vein [facility] vascular next Tuesday I will send him a note about my concerns about the right TM 1/10; we inadvertently remove the Apligraf from the left surgical site  however everything looks closed here. She still has 2 probing wounds over the remanent of her first metatarsal head both go to bone although there is less erythema and swelling in the area. She does not have an appointment with Dr. Donzetta Matters until next Tuesday. I am going to go ahead and order an MRI of the right foot I had originally planned to have or at least consider HBO if the patient has underlying osteomyelitis in the right foot however she tells me that she lost a significant amount of her visual field during her last run with HBO in the right eye nasal aspect. She saw Dr. Zadie Rhine who is a retinal specialist I will see if I can look at his records. She is still on Augmentin as finishes on Monday. I will see if she has had the MRI before considering additional antibiotics on Tuesday Objective Constitutional Sitting or standing Blood Pressure is within target range for patient.. Pulse regular and within target range for patient.Marland Kitchen Respirations regular, non-labored and within target range.. Temperature is normal and within the target range for the patient.Marland Kitchen Appears in no distress. Vitals Time Taken: 10:05 AM, Height: 64 in, Weight: 135 lbs, BMI: 23.2, Temperature: 97.6 F, Pulse: 69 bpm, Respiratory Rate: 16 breaths/min, Blood Pressure: 109/31 mmHg, Capillary Blood Glucose: 91 mg/dl. General Notes: Wound exam; as  mentioned we remove the Apligraf thoroughly from her surgical site on the left TMA however I cannot identify any wounded area. There is a divot going into the center part of the wound at roughly the third metatarsal head but opening this there is nothing but epithelialization. oo The major areas on the right first metatarsal head remanent 2 areas that probes to bone. My original culture of this area was negative Integumentary (Hair, Skin) Wound #13 status is Open. Original cause of wound was Surgical Injury. The date acquired was: 05/11/2021. The wound has been in treatment 15 weeks. The wound is located on the Left Amputation Site - Transmetatarsal. The wound measures 0.3cm length x 0.3cm width x 0.1cm depth; 0.071cm^2 area and 0.007cm^3 volume. There is Fat Layer (Subcutaneous Tissue) exposed. There is a medium amount of serosanguineous drainage noted. The wound margin is distinct with the outline attached to the wound base. There is large (67-100%) red granulation within the wound bed. There is no necrotic tissue within the wound bed. Wound #14 status is Open. Original cause of wound was Pressure Injury. The date acquired was: 05/11/2021. The wound has been in treatment 15 weeks. The wound is located on the Left,Posterior Calcaneus. The wound measures 0.2cm length x 0.2cm width x 0.2cm depth; 0.031cm^2 area and 0.006cm^3 volume. There is Fat Layer (Subcutaneous Tissue) exposed. There is a medium amount of serosanguineous drainage noted. The wound margin is flat and intact. There is large (67-100%) red granulation within the wound bed. There is a small (1-33%) amount of necrotic tissue within the wound bed including Adherent Slough. Wound #15 status is Open. Original cause of wound was Other Lesion. The date acquired was: 09/15/2021. The wound has been in treatment 1 weeks. 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 The wound measures 0.5cm length x 1.4cm width x 0.3cm depth;  0.55cm^2 area and 0.165cm^3 volume. There is bone and Fat Layer (Subcutaneous Tissue) exposed. There is a medium amount of serosanguineous drainage noted. The wound margin is distinct with the outline attached to the wound base. There is small (1-33%) pink, pale granulation within the wound bed. There is  a large (67-100%) amount of necrotic tissue within the wound bed including Adherent Slough. Wound #16 status is Open. Original cause of wound was Gradually Appeared. The date acquired was: 09/26/2021. The wound has been in treatment 1 weeks. The wound is located on the [place]. The wound measures 0.3cm length x 0.5cm width x 0.2cm depth; 0.118cm^2 area and 0.024cm^3 volume. There is bone and Fat Layer (Subcutaneous Tissue) exposed. There is a medium amount of serosanguineous drainage noted. The wound margin is flat and intact. There is large (67-100%) pink granulation within the wound bed. There is a small (1-33%) amount of necrotic tissue within the wound bed including Adherent Slough. 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 - with Dr. Arcola Jansky Shower/ Hygiene: May shower with protection but do not get wound dressing(s) wet. Edema Control - Lymphedema / SCD / Other: Elevate legs to the level of the heart or above for 30 minutes daily and/or when sitting, a frequency of: - throughout the day Avoid standing for long periods of time. Home Health: New wound care orders this  week; continue Home Health for wound care. May utilize formulary equivalent dressing for wound treatment orders unless otherwise specified. Dressing changes to be completed by [place] on Monday / Wednesday / Friday except when patient has scheduled visit at [facility]. Other Home Health Orders/Instructions: - DTOIZTI Radiology ordered were: Magnetic Resonance Imaging (MRI) - W/ and W/O CONTRAST RIGHT FOOT ICD 10: CPT ; . : WOUND #13: - Amputation Site - Transmetatarsal Wound Laterality: Left Cleanser: Wound Cleanser (Home Health) 2 x Per Week/15 Days Discharge Instructions: Cleanse the wound with wound cleanser prior to applying a clean dressing using gauze sponges, not tissue or cotton balls. Prim Dressing: Promogran Prisma Matrix, 4.34 (sq in) (silver collagen) (Home Health) 2 x Per Week/15 Days ary Discharge Instructions: Moisten collagen with saline or hydrogel Secondary Dressing: Woven Gauze Sponge, Non-Sterile 4x4 in (Home Health) 2 x Per Week/15 Days Discharge Instructions: Apply over primary dressing as directed. Secondary Dressing: ABD Pad, 5x9 (Home Health) 2 x Per Week/15 Days Discharge Instructions: Apply over primary dressing as directed. Secured With: [employer], 4.5x3.1 (in/yd) (Home Health) 2 x Per Week/15 Days Discharge Instructions: Secure with Kerlix as directed. Secured With: Paper T ape, 2x10 (in/yd) (Home Health) 2 x Per Week/15 Days Discharge Instructions: Secure dressing with tape as directed. WOUND #14: - Calcaneus Wound Laterality: Left, Posterior Cleanser: Wound Cleanser (Home Health) 2 x Per Week/15 Days Discharge Instructions: Cleanse the wound with wound cleanser prior to applying a clean dressing using gauze sponges, not tissue or cotton balls. Prim Dressing: Promogran Prisma Matrix, 4.34 (sq in) (silver collagen) 2 x Per Week/15 Days ary Discharge Instructions: Moisten collagen with saline or hydrogel Secondary Dressing: Woven Gauze  Sponge, Non-Sterile 4x4 in (Home Health) 2 x Per Week/15 Days Discharge Instructions: Apply over primary dressing as directed. Secondary Dressing: ABD Pad, 5x9 (Home Health) 2 x Per Week/15 Days Discharge Instructions: Apply over primary dressing as directed. Secured With: [employer], 4.5x3.1 (in/yd) (Home Health) 2 x Per  Week/15 Days Discharge Instructions: Secure with Kerlix as directed. Secured With: Paper T ape, 2x10 (in/yd) (Home Health) 2 x Per Week/15 Days Discharge Instructions: Secure dressing with tape as directed. WOUND #15: - Amputation Site - Transmetatarsal Wound Laterality: Right Cleanser: Soap and Water [facility]) Every Other Day/30 Days Discharge Instructions: May shower and wash wound with dial antibacterial soap and water prior to dressing change. Prim Dressing: Promogran Prisma Matrix, 4.34 (sq in) (silver collagen) (Home Health) Every Other Day/30 Days ary Discharge Instructions: Moisten collagen with [place] Jelly Secondary Dressing: Woven Gauze Sponges 2x2 in [facility]) Every Other Day/30 Days Discharge Instructions: Apply over primary dressing as directed. Secured With: [employer], 4.5x3.1 (in/yd) [employer]) Every Other Day/30 Days Discharge Instructions: Secure with Kerlix as directed. Secured With: [employer] T ape, 2x10 (in/yd) (Home Health) Every Other Day/30 Days Discharge Instructions: Secure dressing with tape as directed. Secured With: Netting Every Other Day/30 Days WOUND #16: - Foot Wound Laterality: Plantar, Right Cleanser: Soap and Water [facility]) Every Other Day/30 Days Discharge Instructions: May shower and wash wound with dial antibacterial soap and water prior to dressing change. Prim Dressing: Promogran Prisma Matrix, 4.34 (sq in) (silver collagen) (Home Health) Every Other Day/30 Days ary Discharge Instructions: Moisten collagen with [place] Jelly Secondary Dressing: Woven Gauze Sponges 2x2 in [facility]) Every Other  Day/30 Days Discharge Instructions: Apply over primary dressing as directed. Secured With: [employer], 4.5x3.1 (in/yd) [facility]) Every Other Day/30 Days Discharge Instructions: Secure with Kerlix as directed. Secured With: [employer] T ape, 2x10 (in/yd) (Home Health) Every Other Day/30 Days Discharge Instructions: Secure dressing with tape as directed. Secured With: Netting Every Other Day/30 Days 1. Silver collagen to all wound areas on both feet. The left is for all intents and purposes healed 2. I have ordered an MRI of the right foot with contrast. Based on this further antibiotics and/or consideration of an infectious disease consult 3. Sees Dr. Donzetta Matters on Tuesday. I suspect she will need an angiogram on the right Electronic Signature(s) Signed: 10/03/2021 4:28:08 PM By: [name] MD Entered By: [name] on 10/03/2021 11:30:19 -------------------------------------------------------------------------------- SuperBill Details Patient Name: Date of Service: [name], [name] 10/03/2021 Medical Record Number: [id number] Patient Account Number: [id number] Date of Birth/Sex: Treating RN: Nov 22, 1940 (81 y.o. [name], [name] Primary Care Provider: [name] Other Clinician: Referring Provider: Treating Provider/[name]: Tressie Stalker in Treatment: 15 Diagnosis Coding ICD-10 Codes Code Description [id number] Type 2 diabetes mellitus with foot ulcer T81.31XD Disruption of external operation (surgical) wound, not elsewhere classified, subsequent encounter L97.514 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 L97.528 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ity L97.428 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 Facility  Procedures CPT4 Code: [id number] Description: 44628 - WOUND CARE VISIT-LEV 5 EST PT Modifier: Quantity: 1 Physician Procedures : CPT4 Code Description Modifier [id number] - WC PHYS LEVEL 4 - EST PT ICD-10 Diagnosis Description 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

## 2021-10-03 NOTE — Progress Notes (Signed)
[name], [name] ([id number]) Visit Report for 10/03/2021 Arrival Information Details Patient Name: Date of Service: [name] 10/03/2021 9:30 A M Medical Record Number: [id number] Patient Account Number: [id number] Date of Birth/Sex: Treating RN: [birth year] (81 y.o. [name] Primary Care Rashi Granier: [name] Other Clinician: Referring Demarrion Meiklejohn: Treating Sirus Labrie/[name]: Tressie Stalker in Treatment: 15 Visit Information History Since Last Visit Added or deleted any medications: No Patient Arrived: [name] Any new allergies or adverse reactions: No Arrival Time: 10:03 Had a fall or experienced change in No Accompanied By: self activities of daily living that may affect Transfer Assistance: None risk of falls: Patient Identification Verified: Yes Signs or symptoms of abuse/neglect since last visito No Secondary Verification Process Completed: Yes Hospitalized since last visit: No Patient Requires Transmission-Based Precautions: No Implantable device outside of the clinic excluding No Patient Has Alerts: Yes cellular tissue based products placed in the center Patient Alerts: Patient on Blood Thinner since last visit: L ABI: 0.82 Has Dressing in Place as Prescribed: Yes R ABI: 0.47 Pain Present Now: Yes Electronic Signature(s) Signed: 10/03/2021 3:01:20 PM By: Sandre Kitty Entered By: Sandre Kitty on 10/03/2021 10:03:52 -------------------------------------------------------------------------------- Clinic Level of Care Assessment Details Patient Name: Date of Service: [name] 10/03/2021 9:30 A M Medical Record Number: [id number] Patient Account Number: [id number] Date of Birth/Sex: Treating RN: November 12, 1940 (81 y.o. [name] Primary Care Miarose Lippert: [name] Other Clinician: Referring Azuree Minish: Treating Winthrop Shannahan/[name]: Tressie Stalker in Treatment: 15 Clinic  Level of Care Assessment Items TOOL 4 Quantity Score X- 1 0 Use when only an EandM is performed on FOLLOW-UP visit ASSESSMENTS - Nursing Assessment / Reassessment X- 1 10 Reassessment of Co-morbidities (includes updates in patient status) X- 1 5 Reassessment of Adherence to Treatment Plan ASSESSMENTS - Wound and Skin A ssessment / Reassessment []  - 0 Simple Wound Assessment / Reassessment - one wound X- 4 5 Complex Wound Assessment / Reassessment - multiple wounds []  - 0 Dermatologic / Skin Assessment (not related to wound area) ASSESSMENTS - Focused Assessment X- 1 5 Circumferential Edema Measurements - multi extremities []  - 0 Nutritional Assessment / Counseling / Intervention []  - 0 Lower Extremity Assessment (monofilament, tuning fork, pulses) []  - 0 Peripheral Arterial Disease Assessment (using hand held doppler) ASSESSMENTS - Ostomy and/or Continence Assessment and Care []  - 0 Incontinence Assessment and Management []  - 0 Ostomy Care Assessment and Management (repouching, etc.) PROCESS - Coordination of Care []  - 0 Simple Patient / Family Education for ongoing care X- 1 20 Complex (extensive) Patient / Family Education for ongoing care X- 1 10 Staff obtains Programmer, systems, Records, T Results / Process Orders est X- 1 10 Staff telephones HHA, Nursing Homes / Clarify orders / etc []  - 0 Routine Transfer to another Facility (non-emergent condition) []  - 0 Routine Hospital Admission (non-emergent condition) []  - 0 New Admissions / Biomedical engineer / Ordering NPWT Apligraf, etc. , []  - 0 Emergency Hospital Admission (emergent condition) []  - 0 Simple Discharge Coordination X- 1 15 Complex (extensive) Discharge Coordination PROCESS - Special Needs []  - 0 Pediatric / Minor Patient Management []  - 0 Isolation Patient Management []  - 0 Hearing / Language / Visual special needs []  - 0 Assessment of Community assistance (transportation, D/C planning,  etc.) []  - 0 Additional assistance / Altered mentation []  - 0 Support Surface(s) Assessment (bed, cushion, seat, etc.) INTERVENTIONS - Wound Cleansing / Measurement []  - 0 Simple Wound Cleansing -  one wound X- 4 5 Complex Wound Cleansing - multiple wounds X- 1 5 Wound Imaging (photographs - any number of wounds) []  - 0 Wound Tracing (instead of photographs) []  - 0 Simple Wound Measurement - one wound X- 4 5 Complex Wound Measurement - multiple wounds INTERVENTIONS - Wound Dressings []  - 0 Small Wound Dressing one or multiple wounds []  - 0 Medium Wound Dressing one or multiple wounds X- 4 20 Large Wound Dressing one or multiple wounds X- 1 5 Application of Medications - topical []  - 0 Application of Medications - injection INTERVENTIONS - Miscellaneous []  - 0 External ear exam []  - 0 Specimen Collection (cultures, biopsies, blood, body fluids, etc.) []  - 0 Specimen(s) / Culture(s) sent or taken to Lab for analysis []  - 0 Patient Transfer (multiple staff / Civil Service fast streamer / Similar devices) []  - 0 Simple Staple / Suture removal (25 or less) []  - 0 Complex Staple / Suture removal (26 or more) []  - 0 Hypo / Hyperglycemic Management (close monitor of Blood Glucose) []  - 0 Ankle / Brachial Index (ABI) - do not check if billed separately X- 1 5 Vital Signs Has the patient been seen at the hospital within the last three years: Yes Total Score: 230 Level Of Care: New/Established - Level 5 Electronic Signature(s) Signed: 10/03/2021 4:42:21 PM By: Rhae Hammock RN Entered By: Rhae Hammock on 10/03/2021 10:51:22 -------------------------------------------------------------------------------- Encounter Discharge Information Details Patient Name: Date of Service: [name], [name] 10/03/2021 9:30 A M Medical Record Number: [id number] Patient Account Number: [id number] Date of Birth/Sex: Treating RN: 1941/02/24 (81 y.o. [name] Primary Care Sanya Kobrin:  [name] Other Clinician: Referring Doak Mah: Treating Deashia Soule/[name]: Tressie Stalker in Treatment: 15 Encounter Discharge Information Items Discharge Condition: Stable Ambulatory Status: Ambulatory Discharge Destination: Home Transportation: Private Auto Accompanied By: self Schedule Follow-up Appointment: Yes Clinical Summary of Care: Patient Declined Electronic Signature(s) Signed: 10/03/2021 4:42:21 PM By: Rhae Hammock RN Entered By: Rhae Hammock on 10/03/2021 10:52:24 -------------------------------------------------------------------------------- Lower Extremity Assessment Details Patient Name: Date of Service: [name], [name] 10/03/2021 9:30 A M Medical Record Number: [id number] Patient Account Number: [id number] Date of Birth/Sex: Treating RN: 03/22/1941 (81 y.o. [name] Primary Care Aubrielle Stroud: [name] Other Clinician: Referring Enma Maeda: Treating Shametra Cumberland/[name]: Tressie Stalker in Treatment: 15 Edema Assessment Assessed: Shirlyn Goltz: Yes] Patrice Paradise: Yes] Edema: [Left: No] [Right: Yes] Calf Left: Right: Point of Measurement: From Medial Instep 33 cm 33 cm Ankle Left: Right: Point of Measurement: From Medial Instep 20 cm 20 cm Vascular Assessment Pulses: Dorsalis Pedis Palpable: [Right:Yes] Posterior Tibial Palpable: [Right:Yes] Electronic Signature(s) Signed: 10/03/2021 4:42:21 PM By: Rhae Hammock RN Entered By: Rhae Hammock on 10/03/2021 10:43:59 -------------------------------------------------------------------------------- Multi Wound Chart Details Patient Name: Date of Service: [name], [name] 10/03/2021 9:30 A M Medical Record Number: [id number] Patient Account Number: [id number] Date of Birth/Sex: Treating RN: [birth year] (81 y.o. [name] Primary Care Mckaila Duffus: [name] Other Clinician: Referring Pegah Segel: Treating  Amayrany Cafaro/[name]: Tressie Stalker in Treatment: 15 Vital Signs Height(in): 45 Capillary Blood Glucose(mg/dl): 39 Weight(lbs): 135 Pulse(bpm): 39 Body Mass Index(BMI): 23 Blood Pressure(mmHg): 109/31 Temperature(F): 97.6 Respiratory Rate(breaths/min): 16 Photos: Left Amputation Site - Transmetatarsal Left, Posterior Calcaneus Right Amputation Site - Wound Location: Transmetatarsal Surgical Injury Pressure Injury Other Lesion Wounding Event: Diabetic Wound/Ulcer of the Lower Diabetic Wound/Ulcer of the Lower Diabetic Wound/Ulcer of the Lower Primary Etiology: Extremity Extremity Extremity Open Surgical Wound N/A N/A Secondary Etiology: Coronary  Artery Disease, Deep Vein Coronary Artery Disease, Deep Vein Coronary Artery Disease, Deep Vein Comorbid History: Thrombosis, Hypertension, Myocardial Thrombosis, Hypertension, Myocardial Thrombosis, Hypertension, Myocardial Infarction, Peripheral Arterial Disease, Infarction, Peripheral Arterial Disease, Infarction, Peripheral Arterial Disease, Type II Diabetes Type II Diabetes Type II Diabetes 05/11/2021 05/11/2021 09/15/2021 Date Acquired: 15 15 1  Weeks of Treatment: Open Open Open Wound Status: 0.3x0.3x0.1 0.2x0.2x0.2 0.5x1.4x0.3 Measurements L x W x D (cm) 0.071 0.031 0.55 A (cm) : rea 0.007 0.006 0.165 Volume (cm) : 99.80% 97.80% -39.90% % Reduction in A rea: 100.00% 95.70% -39.80% % Reduction in Volume: Grade 2 Grade 2 Grade 2 Classification: Medium Medium Medium Exudate A mount: Serosanguineous Serosanguineous Serosanguineous Exudate Type: red, brown red, brown red, brown Exudate Color: Distinct, outline attached Flat and Intact Distinct, outline attached Wound Margin: Large (67-100%) Large (67-100%) Small (1-33%) Granulation A mount: Red Red Pink, Pale Granulation Quality: None Present (0%) Small (1-33%) Large (67-100%) Necrotic A mount: Fat Layer (Subcutaneous Tissue): Yes Fat  Layer (Subcutaneous Tissue): Yes Fat Layer (Subcutaneous Tissue): Yes Exposed Structures: Fascia: No Fascia: No Bone: Yes Tendon: No Tendon: No Fascia: No Muscle: No Muscle: No Tendon: No Joint: No Joint: No Muscle: No Bone: No Bone: No Joint: No Large (67-100%) Medium (34-66%) None Epithelialization: Wound Number: 16 N/A N/A Photos: N/A N/A Right, Plantar Foot N/A N/A Wound Location: Gradually Appeared N/A N/A Wounding Event: Diabetic Wound/Ulcer of the Lower N/A N/A Primary Etiology: Extremity N/A N/A N/A Secondary Etiology: Coronary Artery Disease, Deep Vein N/A N/A Comorbid History: Thrombosis, Hypertension, Myocardial Infarction, Peripheral Arterial Disease, Type II Diabetes 09/26/2021 N/A N/A Date Acquired: 1 N/A N/A Weeks of Treatment: Open N/A N/A Wound Status: 0.3x0.5x0.2 N/A N/A Measurements L x W x D (cm) 0.118 N/A N/A A (cm) : rea 0.024 N/A N/A Volume (cm) : 0.00% N/A N/A % Reduction in A rea: 0.00% N/A N/A % Reduction in Volume: Grade 2 N/A N/A Classification: Medium N/A N/A Exudate A mount: Serosanguineous N/A N/A Exudate Type: red, brown N/A N/A Exudate Color: Flat and Intact N/A N/A Wound Margin: Large (67-100%) N/A N/A Granulation A mount: Pink N/A N/A Granulation Quality: Small (1-33%) N/A N/A Necrotic A mount: Fat Layer (Subcutaneous Tissue): Yes N/A N/A Exposed Structures: Bone: Yes Fascia: No Tendon: No Muscle: No Joint: No None N/A N/A Epithelialization: Treatment Notes Wound #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 Peri-Wound Care Topical Primary Dressing Promogran Prisma Matrix, 4.34 (sq in) (silver collagen) Discharge Instruction: Moisten collagen with saline or hydrogel Secondary Dressing Woven Gauze Sponge,  Non-Sterile 4x4 in Discharge Instruction: Apply over primary dressing as directed. ABD Pad, 5x9 Discharge Instruction: Apply over primary dressing as directed. Secured With [employer], 4.5x3.1 (in/yd) Discharge Instruction: Secure with Kerlix as directed. Paper Tape, 2x10 (in/yd) Discharge Instruction: Secure dressing with tape as directed. Compression Wrap Compression Stockings Add-Ons Wound #14 (Calcaneus) Wound Laterality: Left, Posterior Cleanser Wound Cleanser Discharge Instruction: Cleanse the wound with wound cleanser prior to applying a clean dressing using gauze sponges, not tissue or cotton balls. Peri-Wound Care Topical Primary Dressing Promogran Prisma Matrix, 4.34 (sq in) (silver collagen) Discharge Instruction: Moisten collagen with saline or hydrogel Secondary Dressing Woven Gauze Sponge, Non-Sterile 4x4 in Discharge Instruction: Apply over primary dressing as directed. ABD Pad, 5x9 Discharge Instruction: Apply over primary dressing as directed. Secured With [employer], 4.5x3.1 (in/yd) Discharge  Instruction: Secure with Kerlix as directed. Paper Tape, 2x10 (in/yd) Discharge Instruction: Secure dressing with tape as directed. Compression Wrap Compression Stockings Add-Ons 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 Peri-Wound Care Topical Primary Dressing Promogran Prisma Matrix, 4.34 (sq in) (silver collagen) Discharge Instruction: Moisten collagen with [place] Jelly Secondary Dressing Woven Gauze Sponges 2x2 in Discharge Instruction: Apply over primary dressing as directed. Secured With [employer], 4.5x3.1 (in/yd) Discharge Instruction: Secure with Kerlix as directed. Transpore Surgical Tape, 2x10 (in/yd) Discharge Instruction: Secure dressing with tape as  directed. Netting Compression Wrap Compression Stockings Add-Ons Wound #16 (Foot) Wound Laterality: Plantar, Right Cleanser Soap and Water Discharge Instruction: May shower and wash wound with dial antibacterial soap and water prior to dressing change. Peri-Wound Care Topical Primary Dressing Promogran Prisma Matrix, 4.34 (sq in) (silver collagen) Discharge Instruction: Moisten collagen with [place] Jelly Secondary Dressing Woven Gauze Sponges 2x2 in Discharge Instruction: Apply over primary dressing as directed. Secured With [employer], 4.5x3.1 (in/yd) Discharge Instruction: Secure with Kerlix as directed. Transpore Surgical Tape, 2x10 (in/yd) Discharge Instruction: Secure dressing with tape as directed. Netting Compression Wrap Compression Stockings Add-Ons Electronic Signature(s) Signed: 10/03/2021 4:28:08 PM By: [name] MD Signed: 10/03/2021 5:00:37 PM By: [name] RN, BSN Entered By: [name] on 10/03/2021 11:25:07 -------------------------------------------------------------------------------- Multi-Disciplinary Care Plan Details Patient Name: Date of Service: [name], [name] 10/03/2021 9:30 A M Medical Record Number: [id number] Patient Account Number: [id number] Date of Birth/Sex: Treating RN: 1941-05-27 (81 y.o. [name] Primary Care Leland Staszewski: [name] Other Clinician: Referring Nolah Krenzer: Treating Kayron Hicklin/[name]: Tressie Stalker in Treatment: [place] reviewed with physician Active Inactive Nutrition Nursing Diagnoses: Impaired glucose control: actual or potential Potential for alteratiion in Nutrition/Potential for imbalanced nutrition Goals: Patient/caregiver agrees to and verbalizes understanding of need to use nutritional supplements and/or vitamins as prescribed Date Initiated: 06/15/2021 Date Inactivated: 09/20/2021 Target Resolution Date:  09/23/2021 Goal Status: Met Patient/caregiver will maintain therapeutic glucose control Date Initiated: 06/15/2021 Target Resolution Date: 10/17/2021 Goal Status: Active Interventions: Assess HgA1c results as ordered upon admission and as needed Assess patient nutrition upon admission and as needed per policy Provide education on elevated blood sugars and impact on wound healing Provide education on nutrition Treatment Activities: Education provided on Nutrition : 06/15/2021 Notes: 09/20/21: Glucose control ongoing. Wound/Skin Impairment Nursing Diagnoses: Impaired tissue integrity Knowledge deficit related to ulceration/compromised skin integrity Goals: Patient/caregiver will verbalize understanding of skin care regimen Date Initiated: 06/15/2021 Target Resolution Date: 10/17/2021 Goal Status: Active Interventions: Assess patient/caregiver ability to obtain necessary supplies Assess patient/caregiver ability to perform ulcer/skin care regimen upon admission and as needed Assess ulceration(s) every visit Provide education on ulcer and skin care Notes: 09/20/21: Wound care regimen continues Electronic Signature(s) Signed: 10/03/2021 4:42:21 PM By: Rhae Hammock RN Entered By: Rhae Hammock on 10/03/2021 10:48:30 -------------------------------------------------------------------------------- Pain Assessment Details Patient Name: Date of Service: [name], [name] 10/03/2021 9:30 A M Medical Record Number: [id number] Patient Account Number: [id number] Date of Birth/Sex: Treating RN: 08-27-1941 (81 y.o. [name] Primary Care Jozeph Persing: [name] Other Clinician: Referring Krystl Wickware: Treating Jayliani Wanner/[name]: Tressie Stalker in Treatment: 15 Active Problems Location of Pain Severity and Description of Pain Patient Has Paino Yes Site Locations Rate the pain. Current Pain Level: 4 Pain Management and Medication Current Pain  Management: Electronic Signature(s) Signed:  10/03/2021 3:01:20 PM By: Sandre Kitty Signed: 10/03/2021 5:00:37 PM By: [name] RN, BSN Entered By: Sandre Kitty on 10/03/2021 10:04:04 -------------------------------------------------------------------------------- Patient/Caregiver Education Details Patient Name: Date of Service: [name], [name] 1/10/2023andnbsp9:30 A M Medical Record Number: [id number] Patient Account Number: [id number] Date of Birth/Gender: Treating RN: 05/30/1941 (81 y.o. [name] Primary Care Physician: [name] Other Clinician: Referring Physician: Treating Physician/[name]: Tressie Stalker in Treatment: 15 Education Assessment Education Provided To: Patient Education Topics Provided Wound/Skin Impairment: Methods: Explain/Verbal Responses: State content correctly [employer]) Signed: 10/03/2021 4:42:21 PM By: Rhae Hammock RN Entered By: Rhae Hammock on 10/03/2021 10:49:31 -------------------------------------------------------------------------------- Wound Assessment Details Patient Name: Date of Service: [name], [name] 10/03/2021 9:30 A M Medical Record Number: [id number] Patient Account Number: [id number] Date of Birth/Sex: Treating RN: 05/11/1941 (81 y.o. [name] Primary Care Marcial Pless: [name] Other Clinician: Referring [name]: Treating Tykeisha Peer/[name]: Tressie Stalker in Treatment: 15 Wound Status Wound Number: 13 Primary Diabetic Wound/Ulcer of the Lower Extremity Etiology: Wound Location: Left Amputation Site - Transmetatarsal Secondary Open Surgical Wound Wounding Event: Surgical Injury Etiology: Date Acquired: 05/11/2021 Wound Open Weeks Of Treatment: 15 Status: Clustered Wound: No Comorbid Coronary Artery Disease, Deep Vein Thrombosis, Hypertension, History: Myocardial Infarction, Peripheral  Arterial Disease, Type II Diabetes Photos Wound Measurements Length: (cm) 0.3 Width: (cm) 0.3 Depth: (cm) 0.1 Area: (cm) 0.071 Volume: (cm) 0.007 % Reduction in Area: 99.8% % Reduction in Volume: 100% Epithelialization: Large (67-100%) Wound Description Classification: Grade 2 Wound Margin: Distinct, outline attached Exudate Amount: Medium Exudate Type: Serosanguineous Exudate Color: red, brown Foul Odor After Cleansing: No Slough/Fibrino No Wound Bed Granulation Amount: Large (67-100%) Exposed Structure Granulation Quality: Red Fascia Exposed: No Necrotic Amount: None Present (0%) Fat Layer (Subcutaneous Tissue) Exposed: Yes Tendon Exposed: No Muscle Exposed: No Joint Exposed: No Bone Exposed: No Treatment Notes Wound #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 Peri-Wound Care Topical Primary Dressing Promogran Prisma Matrix, 4.34 (sq in) (silver collagen) Discharge Instruction: Moisten collagen with saline or hydrogel Secondary Dressing Woven Gauze Sponge, Non-Sterile 4x4 in Discharge Instruction: Apply over primary dressing as directed. ABD Pad, 5x9 Discharge Instruction: Apply over primary dressing as directed. Secured With [employer], 4.5x3.1 (in/yd) Discharge Instruction: Secure with Kerlix as directed. Paper Tape, 2x10 (in/yd) Discharge Instruction: Secure dressing with tape as directed. Compression Wrap Compression Stockings Add-Ons Electronic Signature(s) Signed: 10/03/2021 3:01:20 PM By: Sandre Kitty Signed: 10/03/2021 5:00:37 PM By: [name] RN, BSN Entered By: Sandre Kitty on 10/03/2021 10:14:35 -------------------------------------------------------------------------------- Wound Assessment Details Patient Name: Date of Service: [name],  [place]. 10/03/2021 9:30 A M Medical Record Number: [id number] Patient Account Number: [id number] Date of Birth/Sex: Treating RN: 03/28/1941 (81 y.o. [name] Primary Care Malick Netz: [name] Other Clinician: Referring Teancum Brule: Treating Dawn Kiper/[name]: Tressie Stalker in Treatment: 15 Wound Status Wound Number: 14 Primary Diabetic Wound/Ulcer of the Lower Extremity Etiology: Wound Location: Left, Posterior Calcaneus Wound Open Wounding Event: Pressure Injury Status: Date Acquired: 05/11/2021 Comorbid Coronary Artery Disease, Deep Vein Thrombosis, Hypertension, Weeks Of Treatment: 15 History: Myocardial Infarction, Peripheral Arterial Disease, Type II Diabetes Clustered Wound: No Photos Wound Measurements Length: (cm) 0.2 Width: (cm) 0.2 Depth: (cm) 0.2 Area: (cm) 0.031 Volume: (cm) 0.006 % Reduction in Area: 97.8% % Reduction in Volume: 95.7% Epithelialization:  Medium (34-66%) Wound Description Classification: Grade 2 Wound Margin: Flat and Intact Exudate Amount: Medium Exudate Type: Serosanguineous Exudate Color: red, brown Foul Odor After Cleansing: No Slough/Fibrino Yes Wound Bed Granulation Amount: Large (67-100%) Exposed Structure Granulation Quality: Red Fascia Exposed: No Necrotic Amount: Small (1-33%) Fat Layer (Subcutaneous Tissue) Exposed: Yes Necrotic Quality: Adherent Slough Tendon Exposed: No Muscle Exposed: No Joint Exposed: No Bone Exposed: No Treatment Notes Wound #14 (Calcaneus) Wound Laterality: Left, Posterior Cleanser Wound Cleanser Discharge Instruction: Cleanse the wound with wound cleanser prior to applying a clean dressing using gauze sponges, not tissue or cotton balls. Peri-Wound Care Topical Primary Dressing Promogran Prisma Matrix, 4.34 (sq in) (silver collagen) Discharge Instruction: Moisten collagen with saline or hydrogel Secondary Dressing Woven Gauze Sponge, Non-Sterile 4x4  in Discharge Instruction: Apply over primary dressing as directed. ABD Pad, 5x9 Discharge Instruction: Apply over primary dressing as directed. Secured With [employer], 4.5x3.1 (in/yd) Discharge Instruction: Secure with Kerlix as directed. Paper Tape, 2x10 (in/yd) Discharge Instruction: Secure dressing with tape as directed. Compression Wrap Compression Stockings Add-Ons Electronic Signature(s) Signed: 10/03/2021 3:01:20 PM By: Sandre Kitty Signed: 10/03/2021 5:00:37 PM By: [name] RN, BSN Entered By: Sandre Kitty on 10/03/2021 10:15:18 -------------------------------------------------------------------------------- Wound Assessment Details Patient Name: Date of Service: [name], [place]. 10/03/2021 9:30 A M Medical Record Number: [id number] Patient Account Number: [id number] Date of Birth/Sex: Treating RN: 26-Dec-1940 (81 y.o. [name] Primary Care Faron Whitelock: [name] Other Clinician: Referring Obediah Welles: Treating Jazlynne Milliner/[name]: Tressie Stalker in Treatment: 15 Wound Status Wound Number: 15 Primary Diabetic Wound/Ulcer of the Lower Extremity Etiology: Wound Location: Right Amputation Site - Transmetatarsal Wound Open Wounding Event: Other Lesion Status: Date Acquired: 09/15/2021 Comorbid Coronary Artery Disease, Deep Vein Thrombosis, Hypertension, Weeks Of Treatment: 1 History: Myocardial Infarction, Peripheral Arterial Disease, Type II Diabetes Clustered Wound: No Photos Wound Measurements Length: (cm) 0.5 Width: (cm) 1.4 Depth: (cm) 0.3 Area: (cm) 0.55 Volume: (cm) 0.165 % Reduction in Area: -39.9% % Reduction in Volume: -39.8% Epithelialization: None Wound Description Classification: Grade 2 Wound Margin: Distinct, outline attached Exudate Amount: Medium Exudate Type: Serosanguineous Exudate Color: red, brown Foul Odor After Cleansing: No Slough/Fibrino Yes Wound Bed Granulation  Amount: Small (1-33%) Exposed Structure Granulation Quality: Pink, Pale Fascia Exposed: No Necrotic Amount: Large (67-100%) Fat Layer (Subcutaneous Tissue) Exposed: Yes Necrotic Quality: Adherent Slough Tendon Exposed: No Muscle Exposed: No Joint Exposed: No Bone Exposed: Yes Treatment Notes 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 Peri-Wound Care Topical Primary Dressing Promogran Prisma Matrix, 4.34 (sq in) (silver collagen) Discharge Instruction: Moisten collagen with [place] Jelly Secondary Dressing Woven Gauze Sponges 2x2 in Discharge Instruction: Apply over primary dressing as directed. Secured With [employer], 4.5x3.1 (in/yd) Discharge Instruction: Secure with Kerlix as directed. Transpore Surgical Tape, 2x10 (in/yd) Discharge Instruction: Secure dressing with tape as directed. Netting Compression Wrap Compression Stockings Add-Ons Electronic Signature(s) Signed: 10/03/2021 3:01:20 PM By: Sandre Kitty Signed: 10/03/2021 5:00:37 PM By: [name] RN, BSN Entered By: Sandre Kitty on 10/03/2021 10:16:37 -------------------------------------------------------------------------------- Wound Assessment Details Patient Name: Date of Service: [name], [name] 10/03/2021 9:30 A M Medical Record Number: [id number] Patient Account Number: [id number] Date of Birth/Sex: Treating RN: 02-12-1941 (81 y.o. [name] Primary Care Ramaj Frangos: [name] Other Clinician: Referring Aikeem Lilley: Treating Zaion Hreha/[name]: Tressie Stalker in Treatment: 15 Wound Status  Wound Number: 16 Primary Diabetic Wound/Ulcer of the Lower Extremity Etiology: Wound Location: Right, Plantar Foot Wound Open Wounding Event: Gradually Appeared Status: Date Acquired:  09/26/2021 Comorbid Coronary Artery Disease, Deep Vein Thrombosis, Hypertension, Weeks Of Treatment: 1 History: Myocardial Infarction, Peripheral Arterial Disease, Type II Diabetes Clustered Wound: No Photos Wound Measurements Length: (cm) 0.3 Width: (cm) 0.5 Depth: (cm) 0.2 Area: (cm) 0.118 Volume: (cm) 0.024 % Reduction in Area: 0% % Reduction in Volume: 0% Epithelialization: None Wound Description Classification: Grade 2 Wound Margin: Flat and Intact Exudate Amount: Medium Exudate Type: Serosanguineous Exudate Color: red, brown Foul Odor After Cleansing: No Slough/Fibrino Yes Wound Bed Granulation Amount: Large (67-100%) Exposed Structure Granulation Quality: Pink Fascia Exposed: No Necrotic Amount: Small (1-33%) Fat Layer (Subcutaneous Tissue) Exposed: Yes Necrotic Quality: Adherent Slough Tendon Exposed: No Muscle Exposed: No Joint Exposed: No Bone Exposed: Yes Treatment Notes Wound #16 (Foot) Wound Laterality: Plantar, Right Cleanser Soap and Water Discharge Instruction: May shower and wash wound with dial antibacterial soap and water prior to dressing change. Peri-Wound Care Topical Primary Dressing Promogran Prisma Matrix, 4.34 (sq in) (silver collagen) Discharge Instruction: Moisten collagen with [place] Jelly Secondary Dressing Woven Gauze Sponges 2x2 in Discharge Instruction: Apply over primary dressing as directed. Secured With [employer], 4.5x3.1 (in/yd) Discharge Instruction: Secure with Kerlix as directed. Transpore Surgical Tape, 2x10 (in/yd) Discharge Instruction: Secure dressing with tape as directed. Netting Compression Wrap Compression Stockings Add-Ons Electronic Signature(s) Signed: 10/03/2021 3:01:20 PM By: Sandre Kitty Signed: 10/03/2021 5:00:37 PM By: [name] RN, BSN Entered By: Sandre Kitty on 10/03/2021 10:17:15 -------------------------------------------------------------------------------- Vitals  Details Patient Name: Date of Service: [name], [place]. 10/03/2021 9:30 A M Medical Record Number: [id number] Patient Account Number: [id number] Date of Birth/Sex: Treating RN: 1940/10/09 (81 y.o. [name] Primary Care Romario Tith: [name] Other Clinician: Referring Terecia Plaut: Treating Kadon Andrus/[name]: Tressie Stalker in Treatment: 15 Vital Signs Time Taken: 10:05 Temperature (F): 97.6 Height (in): 64 Pulse (bpm): 69 Weight (lbs): 135 Respiratory Rate (breaths/min): 16 Body Mass Index (BMI): 23.2 Blood Pressure (mmHg): 109/31 Capillary Blood Glucose (mg/dl): 91 Reference Range: 80 - 120 mg / dl Electronic Signature(s) Signed: 10/03/2021 3:01:20 PM By: Sandre Kitty Entered By: Sandre Kitty on 10/03/2021 10:05:59

## 2021-10-05 ENCOUNTER — Other Ambulatory Visit: Payer: Self-pay

## 2021-10-05 ENCOUNTER — Encounter: Payer: Self-pay | Admitting: Endocrinology

## 2021-10-05 ENCOUNTER — Ambulatory Visit (INDEPENDENT_AMBULATORY_CARE_PROVIDER_SITE_OTHER): Payer: Medicare Other | Admitting: Endocrinology

## 2021-10-05 VITALS — BP 142/58 | HR 92 | Ht 64.5 in | Wt 148.6 lb

## 2021-10-05 DIAGNOSIS — L89620 Pressure ulcer of left heel, unstageable: Secondary | ICD-10-CM | POA: Diagnosis not present

## 2021-10-05 DIAGNOSIS — I10 Essential (primary) hypertension: Secondary | ICD-10-CM

## 2021-10-05 DIAGNOSIS — I70202 Unspecified atherosclerosis of native arteries of extremities, left leg: Secondary | ICD-10-CM | POA: Diagnosis not present

## 2021-10-05 DIAGNOSIS — D638 Anemia in other chronic diseases classified elsewhere: Secondary | ICD-10-CM | POA: Diagnosis not present

## 2021-10-05 DIAGNOSIS — E875 Hyperkalemia: Secondary | ICD-10-CM

## 2021-10-05 DIAGNOSIS — E1165 Type 2 diabetes mellitus with hyperglycemia: Secondary | ICD-10-CM

## 2021-10-05 DIAGNOSIS — Z4781 Encounter for orthopedic aftercare following surgical amputation: Secondary | ICD-10-CM | POA: Diagnosis not present

## 2021-10-05 DIAGNOSIS — E11628 Type 2 diabetes mellitus with other skin complications: Secondary | ICD-10-CM | POA: Diagnosis not present

## 2021-10-05 MED ORDER — REPAGLINIDE 1 MG PO TABS: 1.0000 mg | ORAL_TABLET | Freq: Every day | ORAL | 3 refills | Status: DC

## 2021-10-05 MED ORDER — EMPAGLIFLOZIN 25 MG PO TABS: 25.0000 mg | ORAL_TABLET | Freq: Every day | ORAL | 2 refills | Status: DC

## 2021-10-05 NOTE — Patient Instructions (Addendum)
Take  1 mg Prandin before Breakfast  Change Invokana  Stop METFORMIN IN PM  Foods high in potassium include bananas, oranges, tomatoes, spinach and other green leafy vegetables, melons, peas and beans, yogurt and potatoes.

## 2021-10-05 NOTE — Progress Notes (Signed)
Patient ID: [name], female   DOB: [birth year], 81 y.o.   MRN: [id number]          Reason for Appointment: Follow-up for endocrinology problems   History of Present Illness:          Date of diagnosis of type 2 diabetes mellitus: 1984       Background history:   She has been on metformin since about the time of her diagnosis and has been on other drugs also but has not had many changes in her regimen in the last several years She thinks her blood sugar used to be fairly well controlled At some point she was given Byetta for about a year and a half which improved her control but this was subsequently start because of it being too expensive for her.  She had no side effects with this  Recent history:   Her A1c is 7.6 compared to 5.8  Non-insulin hypoglycemic regimen: Amaryl 2 mg in a.m., Invokana 300 mg daily,   metformin 1 gdaily  Current management, level of control, blood sugar patterns and problems identified:  Fructosamine previously 237, now 275  Her A1c previously was lower because of anemia which has almost resolved  she is checking her blood sugars mostly in the mornings and not regularly  She was told to take only half a tablet of metformin in the evening but fasting readings are still as low as 68 although on an average is little higher Takes Invokana in the morning also She has only sporadic high blood sugars mostly mid morning and midday Despite adding more protein like eggs and cheese in the morning she still has high readings after breakfast She does take Amaryl before eating in the morning She has had some foot infection again but not severe        Side effects from medications have been: None  Compliance with the medical regimen: Good Hypoglycemia:   None  Glucose monitoring:  About 1 times a day         Glucometer: One Touch.       Blood Glucose data:    PRE-MEAL Fasting Lunch Dinner Bedtime Overall  Glucose range: 68-166 172-215  129 68-215   Mean/median: 102    128   POST-MEAL PC Breakfast PC Lunch PC Dinner  Glucose range:     Mean/median:      Previous data:  PRE-MEAL Fasting Lunch Dinner Bedtime Overall  Glucose range: 63-1 05    63-192  Mean/median: 88    118   POST-MEAL PC Breakfast PC Lunch PC Dinner  Glucose range:   191  Mean/median:        Self-care: The diet that the patient has been following is: tries to limit sweets and fried food.     Meal times are:  Breakfast is at 8:30 AM lunch: 1 PM, dinner: 6:30 PM  Typical meal intake: Breakfast is yogurt/cereal with fruit or egg/toast or oatmeal Sometimes drinking boost  Lunch is usually soup with crackers, dinner is usually a low-fat meat with vegetables Snacks will be celery and ranch dressing               Dietician visit, most recent: 10/18                Weight history:  Wt Readings from Last 3 Encounters:  10/05/21 148 lb 9.6 oz (67.4 kg)  09/07/21 140 lb (63.5 kg)  09/05/21 146 lb (66.2 kg)  Glycemic control:   Lab Results  Component Value Date   HGBA1C 7.6 (H) 09/29/2021   HGBA1C 5.8 06/28/2021   HGBA1C 7.2 (H) 04/27/2021   Lab Results  Component Value Date   MICROALBUR 3.8 (H) 09/29/2021   LDLCALC 42 04/28/2021   CREATININE 0.97 09/29/2021   Lab Results  Component Value Date   MICRALBCREAT 16.3 09/29/2021    Lab Results  Component Value Date   FRUCTOSAMINE 275 09/29/2021   FRUCTOSAMINE 237 03/29/2021   FRUCTOSAMINE 243 03/18/2019   Lab Results  Component Value Date   HGB 11.9 (L) 09/29/2021      Allergies as of 10/05/2021       Reactions   Bactrim [sulfamethoxazole-trimethoprim] Other (See Comments)   ^ K+( elevated)    Demerol [meperidine]    Delusional    Scopolamine    Delusional    Tramadol Other (See Comments)   Keeps awake   Versed [midazolam] Anxiety   Frantic, out of my mind, agitated         Medication List        Accurate as of October 05, 2021  8:27 PM. If you have any questions, ask your  nurse or doctor.          STOP taking these medications    canagliflozin 300 MG Tabs tablet Commonly known as: INVOKANA Stopped by: Elayne Snare, MD       TAKE these medications    acetaminophen 500 MG tablet Commonly known as: TYLENOL Take 1,000 mg by mouth every 6 (six) hours as needed for moderate pain or headache.   amoxicillin-clavulanate 875-125 MG tablet Commonly known as: AUGMENTIN Take 1 tablet by mouth 2 (two) times daily.   aspirin EC 81 MG tablet Take 81 mg by mouth at bedtime.   atorvastatin 80 MG tablet Commonly known as: LIPITOR Take 1 tablet (80 mg total) by mouth daily. Patient must keep upcoming appointment to receive future refills.   brimonidine 0.2 % ophthalmic solution Commonly known as: ALPHAGAN Place 1 drop into the right eye 2 (two) times daily.   CINNAMON PO Take 2 tablets by mouth in the morning, at noon, and at bedtime.   [employer] SENIOR PROBIOTIC PO Take 1 capsule by mouth daily with breakfast.   empagliflozin 25 MG Tabs tablet Commonly known as: Jardiance Take 1 tablet (25 mg total) by mouth daily before breakfast. Started by: Elayne Snare, MD   Ferocon capsule Generic drug: ferrous [id number] C-folic acid Take 1 capsule by mouth 2 (two) times daily.   glimepiride 2 MG tablet Commonly known as: AMARYL TAKE 1 TABLET BY MOUTH ONCE DAILY BEFORE BREAKFAST   hydrochlorothiazide 12.5 MG tablet Commonly known as: HYDRODIURIL Take 1 tablet by mouth once daily   metFORMIN 1000 MG tablet Commonly known as: GLUCOPHAGE Take 1 tablet by mouth twice daily What changed: when to take this   metoprolol tartrate 25 MG tablet Commonly known as: LOPRESSOR TAKE 1 & 1/2 (ONE & ONE-HALF) TABLETS BY MOUTH TWICE DAILY   OneTouch Delica Plus [id number] Misc Use to check blood sugars twice daily.   OneTouch Verio test strip Generic drug: glucose blood Use to check blood sugars once daily   pantoprazole 40 MG tablet Commonly known as:  PROTONIX Take 1 tablet (40 mg total) by mouth daily. What changed:  when to take this additional instructions   pyridoxine 100 MG tablet Commonly known as: B-6 Take 100 mg by mouth every evening.   repaglinide 1 MG tablet Commonly  known as: Prandin Take 1 tablet (1 mg total) by mouth daily before breakfast. Started by: Elayne Snare, MD   SYSTANE OP Place 1 drop into the left eye 2 (two) times daily as needed (Dry eyes).   telmisartan 80 MG tablet Commonly known as: MICARDIS Take 1 tablet by mouth once daily   Xarelto 2.5 MG Tabs tablet Generic drug: rivaroxaban Take 1 tablet by mouth twice daily        Allergies:  Allergies  Allergen Reactions   Bactrim [Sulfamethoxazole-Trimethoprim] Other (See Comments)    ^ K+( elevated)    Demerol [Meperidine]     Delusional    Scopolamine     Delusional    Tramadol Other (See Comments)    Keeps awake   Versed [Midazolam] Anxiety    Frantic, out of my mind, agitated     Past Medical History:  Diagnosis Date   Anemia    after CABG in june 2021   Arthritis    Cancer ([place])    removal from nose - MOSE procedure    Complication of anesthesia    VERSED- agitated, muscle spasms, "jerking" , frantic , (never had this occurence in the pas)    Coronary artery disease    Diabetes mellitus without complication ([place])    Dysrhythmia    PVC's   GERD (gastroesophageal reflux disease)    Heart murmur    History of hiatal hernia    Hyperlipidemia    Hypertension 11/20/11   ECHO- EF>55% Borderline concentric left ventricular hypertrophy. There is a small calcified mass in the L:A near the LA appendage. No valvular masses seen with associated mitral annular calcification. LA Volume/ BSA27.4 ml/m2 No AS. Right ventricular systolic pressure is elevated at 27mmHg.   Myocardial infarction [facility])    June 2021   Neuromuscular disorder ([place])    neuropathy in bilateral feet   Peripheral vascular disease ([place])    Pneumonia    not hosp.     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  PTCAs with drug coated balloon Lt ant tibial artery and Lt popliteal artery  11/26/2019    Past Surgical History:  Procedure Laterality Date   ABDOMINAL AORTAGRAM  11/25/2019   ABDOMINAL AORTOGRAM W/LOWER EXTREMITY    ABDOMINAL AORTOGRAM N/A 06/15/2019   Procedure: ABDOMINAL AORTOGRAM;  Surgeon: [name], MD;  Location: [place] [facility];  Service: Cardiovascular;  Laterality: N/A;   ABDOMINAL AORTOGRAM W/LOWER EXTREMITY N/A 11/25/2019   Procedure: ABDOMINAL AORTOGRAM W/LOWER EXTREMITY;  Surgeon: [name], MD;  Location: [place] [facility];  Service: Cardiovascular;  Laterality: N/A;  Lt leg   ABDOMINAL AORTOGRAM W/LOWER EXTREMITY N/A 08/01/2020   Procedure: ABDOMINAL AORTOGRAM W/LOWER EXTREMITY;  Surgeon: [name], MD;  Location: [place] [facility];  Service: Cardiovascular;  Laterality: N/A;   ABDOMINAL AORTOGRAM W/LOWER EXTREMITY N/A 04/28/2021   Procedure: ABDOMINAL AORTOGRAM W/LOWER EXTREMITY;  Surgeon: [name], MD;  Location: [place] [facility];  Service: Cardiovascular;  Laterality: N/A;   AMPUTATION TOE Left 05/03/2021   Procedure: AMPUTATION OF THIRD LEFT  TOE;  Surgeon: [name], MD;  Location: [facility] OR;  Service: Vascular;  Laterality: Left;   APPENDECTOMY     APPLICATION OF WOUND VAC Right 07/21/2019   Procedure: Application Of Prevena Wound Vac Right Groin;  Surgeon: [name], MD;  Location: [facility] OR;  Service: Vascular;  Laterality: Right;   [place]  CARPAL TUNNEL RELEASE     CESAREAN SECTION     x 2   COLONOSCOPY     CORONARY ARTERY BYPASS GRAFT N/A 03/24/2020   Procedure: CORONARY ARTERY BYPASS GRAFTING (CABG) using LIMA to LAD (m); RIMA to RAMUS; Endoscopic Right Greater Saphenous Vein: SVG to Diag1; SVG to PLB (right); and SVG to PL (left).;  Surgeon: [name], MD;  Location: [place];  Service: Open Heart Surgery;  Laterality: N/A;   BILATERAL IMA   ENDARTERECTOMY FEMORAL Right 07/21/2019   Procedure: RIGHT ENDARTERECTOMY FEMORAL WITH PATCH ANGIOPLASTY;  Surgeon: [name], MD;  Location: [place];  Service: Vascular;  Laterality: Right;   ENDARTERECTOMY FEMORAL Right 08/16/2020   Procedure: RIGHT FEMORAL ENDARTERECTOMY;  Surgeon: [name], MD;  Location: [place];  Service: Vascular;  Laterality: Right;   ENDOVEIN HARVEST OF GREATER SAPHENOUS VEIN Right 03/24/2020   Procedure: ENDOVEIN HARVEST OF GREATER SAPHENOUS VEIN;  Surgeon: [name], MD;  Location: [place];  Service: Open Heart Surgery;  Laterality: Right;   EYE SURGERY     cataract removal bilaterally   FEMORAL-POPLITEAL BYPASS GRAFT Right 08/16/2020   Procedure: BYPASS GRAFT FEMORAL-POPLITEAL ARTERY;  Surgeon: [name], MD;  Location: [place];  Service: Vascular;  Laterality: Right;   FEMORAL-POPLITEAL BYPASS GRAFT Left 05/03/2021   Procedure: LEFT FEMORAL TO BELOW KNEE POPLITEAL ARTERY BYPASS GRAFTING WITH 6MMX80 PTFE GRAFT;  Surgeon: [name], MD;  Location: [place];  Service: Vascular;  Laterality: Left;   INTRAVASCULAR LITHOTRIPSY  11/25/2019   Procedure: INTRAVASCULAR LITHOTRIPSY;  Surgeon: [name], MD;  Location: [place] [facility];  Service: Cardiovascular;;  LT. SFA   LEFT HEART CATH AND CORONARY ANGIOGRAPHY N/A 03/22/2020   Procedure: LEFT HEART CATH AND CORONARY ANGIOGRAPHY;  Surgeon: Martinique, Peter M, MD;  Location: [place] [facility];  Service: Cardiovascular;  Laterality: N/A;   LOWER EXTREMITY ANGIOGRAPHY Bilateral 06/15/2019   Procedure: Lower Extremity Angiography;  Surgeon: [name], MD;  Location: [place] [facility];  Service: Cardiovascular;  Laterality: Bilateral;   LOWER EXTREMITY ANGIOGRAPHY Right 08/03/2019   Procedure: LOWER EXTREMITY ANGIOGRAPHY;  Surgeon: [name], MD;  Location: [place] [facility];  Service: Cardiovascular;  Laterality: Right;   LOWER EXTREMITY ANGIOGRAPHY  N/A 09/07/2021   Procedure: LOWER EXTREMITY ANGIOGRAPHY;  Surgeon: Marty Heck, MD;  Location: [place] [facility];  Service: Cardiovascular;  Laterality: N/A;   PATCH ANGIOPLASTY Right 07/21/2019   Procedure: Patch Angioplasty Right Femoral Artery;  Surgeon: [name], MD;  Location: [place];  Service: Vascular;  Laterality: Right;   PERIPHERAL VASCULAR ATHERECTOMY  08/03/2019   Procedure: PERIPHERAL VASCULAR ATHERECTOMY;  Surgeon: [name], MD;  Location: [place] [facility];  Service: Cardiovascular;;  right SFA, right TP trunk   PERIPHERAL VASCULAR ATHERECTOMY  11/25/2019   Procedure: PERIPHERAL VASCULAR ATHERECTOMY;  Surgeon: [name], MD;  Location: [place] [facility];  Service: Cardiovascular;;  Lt.  POPLITEAL and AT   PERIPHERAL VASCULAR BALLOON ANGIOPLASTY Left 06/15/2019   Procedure: PERIPHERAL VASCULAR BALLOON ANGIOPLASTY;  Surgeon: [name], MD;  Location: [place] [facility];  Service: Cardiovascular;  Laterality: Left;  SFA UNSUCCESSFUL UNABLE TO CROSS LESION   PERIPHERAL VASCULAR BALLOON ANGIOPLASTY  08/03/2019   Procedure: PERIPHERAL VASCULAR BALLOON ANGIOPLASTY;  Surgeon: [name], MD;  Location: [place] [facility];  Service: Cardiovascular;;  right SFA, Right TP trunk   PERIPHERAL VASCULAR BALLOON ANGIOPLASTY  09/07/2021   Procedure: PERIPHERAL VASCULAR BALLOON ANGIOPLASTY;  Surgeon: Marty Heck, MD;  Location: [place] [facility];  Service: Cardiovascular;;   TEE WITHOUT CARDIOVERSION N/A 03/24/2020   Procedure: TRANSESOPHAGEAL ECHOCARDIOGRAM (TEE);  Surgeon: [name], MD;  Location: [place];  Service: Open Heart Surgery;  Laterality: N/A;   TONSILLECTOMY     and adenoidectomy   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/2019   Procedure: TRANSMETATARSAL AMPUTATION;  Surgeon: Trula Slade, DPM;  Location: [place];  Service: Podiatry;  Laterality: Right;   TRANSMETATARSAL AMPUTATION Left 05/11/2021   Procedure: LEFT  TRANSMETATARSAL AMPUTATION;  Surgeon: [name], MD;  Location[facility] OR;  Service: Vascular;  Laterality: Left;   TUBAL LIGATION      Family History  Problem Relation Age of Onset   Cancer - Prostate Father    Cancer - Colon Father    Stroke Mother    Hypertension Mother    Hyperlipidemia Mother    Melanoma Brother     Social History:  reports that she quit smoking about 49 years ago. Her smoking use included cigarettes. She has never used smokeless tobacco. She reports that she does not drink alcohol and does not use drugs.   Review of Systems   Lipid history: Hypercholesterolemia by PCP with 20 mg Lipitor    Lab Results  Component Value Date   CHOL 86 04/28/2021   HDL 26 (L) 04/28/2021   LDLCALC 42 04/28/2021   TRIG 89 04/28/2021   CHOLHDL 3.3 04/28/2021           Hypertension: Present for several years, currently treated with  Micardis 80 mg, metoprolol, diltiazem and HCTZ, also followed by cardiologist  She monitors at home: Recent reading about 135/60   BP Readings from Last 3 Encounters:  10/05/21 (!) 142/58  09/07/21 (!) 132/54  09/05/21 116/72   Renal function history:  Lab Results  Component Value Date   CREATININE 0.97 09/29/2021   CREATININE 1.20 (H) 09/07/2021   CREATININE 1.26 (H) 06/28/2021     Most recent eye exam was in 4/21  Her anemia has resolved   Lab Results  Component Value Date   HGB 11.9 (L) 09/29/2021     She has had significant PAD followed by vascular cardiologist   LABS:  Lab on 09/29/2021  Component Date Value Ref Range Status   Hgb A1c MFr Bld 09/29/2021 7.6 (H)  4.6 - 6.5 % Final   Glycemic Control Guidelines for People with Diabetes:Non Diabetic:  <6%Goal of Therapy: <7%Additional Action Suggested:  >8%    Microalb, Ur 09/29/2021 3.8 (H)  0.0 - 1.9 mg/dL Final   Creatinine,U 09/29/2021 23.4  mg/dL Final   Microalb Creat Ratio 09/29/2021 16.3  0.0 - 30.0 mg/g Final   Fructosamine 09/29/2021 275  0 -  285 umol/L Final   Comment: Published reference interval for apparently healthy subjects between age 56 and 78 is 13 - 285 umol/L and in a poorly controlled diabetic population is 228 - 563 umol/L with a mean of 396 umol/L.    Sodium 09/29/2021 138  135 - 145 mEq/L Final   Potassium 09/29/2021 5.6 No hemolysis seen (H)  3.5 - 5.1 mEq/L Final   Chloride 09/29/2021 103  96 - 112 mEq/L Final   CO2 09/29/2021 28  19 - 32 mEq/L Final   Glucose, Bld 09/29/2021 202 (H)  70 - 99 mg/dL Final   BUN 09/29/2021 34 (H)  6 - 23 mg/dL Final   Creatinine,  Ser 09/29/2021 0.97  0.40 - 1.20 mg/dL Final   GFR 09/29/2021 55.56 (L)  >60.00 mL/min Final   Calculated using the CKD-EPI Creatinine Equation (2021)   Calcium 09/29/2021 10.1  8.4 - 10.5 mg/dL Final   WBC 09/29/2021 9.9  4.0 - 10.5 K/uL Final   RBC 09/29/2021 4.07  3.87 - 5.11 Mil/uL Final   Platelets 09/29/2021 258.0  150.0 - 400.0 K/uL Final   Hemoglobin 09/29/2021 11.9 (L)  12.0 - 15.0 g/dL Final   HCT 09/29/2021 36.6  36.0 - 46.0 % Final   MCV 09/29/2021 90.0  78.0 - 100.0 fl Final   MCHC 09/29/2021 32.4  30.0 - 36.0 g/dL Final   RDW 09/29/2021 16.8 (H)  11.5 - 15.5 % Final    Physical Examination:  BP (!) 142/58    Pulse 92    Ht 5' 4.5" (1.638 m)    Wt 148 lb 9.6 oz (67.4 kg)    SpO2 98%    BMI 25.11 kg/m       ASSESSMENT:  Diabetes type 2 non-insulin-dependent  See history of present illness for detailed discussion of current diabetes management, blood sugar patterns and problems identified  She is on Invokana, metformin and Amaryl  Her A1c is mildly increased at 7.6  She likely has fairly consistent postprandial hyperglycemia especially after breakfast which she does not monitor Also not clear if she has consistent control after lunch and dinner However considering her age and comorbid conditions her level of control is still reasonably good Fasting readings have been as low as 68 Although she thinks Invokana has a label  regarding amputations discussed that this is not borne out in recent studies  HYPERTENSION: Blood pressure is relatively higher today but he appears to have some whitecoat syndrome and better at home  HYPERKALEMIA: This is likely to be from a combination of Invokana and ARB drug No recent change in renal function  PLAN:    To control her readings after breakfast will try to add Prandin 1 mg before breakfast while continuing the same dose of Amaryl for now She will stop the evening metformin to avoid tendency to low normal readings overnight Reminded her to take Amaryl before eating breakfast in the morning She needs to make sure she has consistent protein at breakfast She does need to check more readings after meals She can check morning readings every other day  Change INVOKANA to Jardiance 25 mg daily Patient information on Jardiance given  Stop eating high potassium foods especially oranges Follow-up in 2 weeks for repeat potassium but may consider reducing ARB drug if potassium still high  Follow-up with PCP regarding management of iron deficiency anemia   Patient Instructions  Take  1 mg Prandin before Breakfast  Change Invokana  Stop METFORMIN IN PM  Foods high in potassium include bananas, oranges, tomatoes, spinach and other green leafy vegetables, melons, peas and beans, yogurt and potatoes.   Elayne Snare 10/05/2021, 8:27 PM   Note: This office note was prepared with Dragon voice recognition system technology. Any transcriptional errors that result from this process are unintentional.

## 2021-10-07 ENCOUNTER — Ambulatory Visit (HOSPITAL_COMMUNITY)
Admission: RE | Admit: 2021-10-07 | Discharge: 2021-10-07 | Disposition: A | Discharge status: Home / Self Care | Payer: Medicare Other | Source: Ambulatory Visit | Attending: Internal Medicine | Admitting: Internal Medicine

## 2021-10-07 DIAGNOSIS — L97509 Non-pressure chronic ulcer of other part of unspecified foot with unspecified severity: Secondary | ICD-10-CM | POA: Diagnosis not present

## 2021-10-07 DIAGNOSIS — E11621 Type 2 diabetes mellitus with foot ulcer: Secondary | ICD-10-CM | POA: Diagnosis not present

## 2021-10-07 DIAGNOSIS — M869 Osteomyelitis, unspecified: Secondary | ICD-10-CM | POA: Insufficient documentation

## 2021-10-07 DIAGNOSIS — R600 Localized edema: Secondary | ICD-10-CM | POA: Diagnosis not present

## 2021-10-07 DIAGNOSIS — E1169 Type 2 diabetes mellitus with other specified complication: Secondary | ICD-10-CM | POA: Diagnosis not present

## 2021-10-07 MED ORDER — GADOBUTROL 1 MMOL/ML IV SOLN
7.0000 mL | Freq: Once | INTRAVENOUS | Status: AC | PRN
  Administered 2021-10-07: 7 mL via INTRAVENOUS

## 2021-10-09 DIAGNOSIS — I70202 Unspecified atherosclerosis of native arteries of extremities, left leg: Secondary | ICD-10-CM | POA: Diagnosis not present

## 2021-10-09 DIAGNOSIS — Z4781 Encounter for orthopedic aftercare following surgical amputation: Secondary | ICD-10-CM | POA: Diagnosis not present

## 2021-10-09 DIAGNOSIS — I10 Essential (primary) hypertension: Secondary | ICD-10-CM | POA: Diagnosis not present

## 2021-10-09 DIAGNOSIS — L89620 Pressure ulcer of left heel, unstageable: Secondary | ICD-10-CM | POA: Diagnosis not present

## 2021-10-09 DIAGNOSIS — E11628 Type 2 diabetes mellitus with other skin complications: Secondary | ICD-10-CM | POA: Diagnosis not present

## 2021-10-09 DIAGNOSIS — D638 Anemia in other chronic diseases classified elsewhere: Secondary | ICD-10-CM | POA: Diagnosis not present

## 2021-10-10 ENCOUNTER — Other Ambulatory Visit: Payer: Self-pay

## 2021-10-10 ENCOUNTER — Ambulatory Visit (INDEPENDENT_AMBULATORY_CARE_PROVIDER_SITE_OTHER)
Admit: 2021-10-10 | Discharge: 2021-10-10 | Disposition: A | Discharge status: Home / Self Care | Payer: Medicare Other | Attending: Vascular Surgery | Admitting: Vascular Surgery

## 2021-10-10 ENCOUNTER — Ambulatory Visit (INDEPENDENT_AMBULATORY_CARE_PROVIDER_SITE_OTHER): Payer: Medicare Other | Admitting: Physician Assistant

## 2021-10-10 ENCOUNTER — Ambulatory Visit (HOSPITAL_COMMUNITY)
Admission: RE | Admit: 2021-10-10 | Discharge: 2021-10-10 | Disposition: A | Discharge status: Home / Self Care | Payer: Medicare Other | Source: Ambulatory Visit | Attending: Vascular Surgery | Admitting: Vascular Surgery

## 2021-10-10 ENCOUNTER — Encounter (HOSPITAL_BASED_OUTPATIENT_CLINIC_OR_DEPARTMENT_OTHER): Payer: Medicare Other | Admitting: Internal Medicine

## 2021-10-10 VITALS — BP 146/55 | HR 79 | Temp 98.4°F | Resp 18 | Ht 64.5 in | Wt 142.0 lb

## 2021-10-10 DIAGNOSIS — L89620 Pressure ulcer of left heel, unstageable: Secondary | ICD-10-CM | POA: Diagnosis not present

## 2021-10-10 DIAGNOSIS — L988 Other specified disorders of the skin and subcutaneous tissue: Secondary | ICD-10-CM | POA: Diagnosis not present

## 2021-10-10 DIAGNOSIS — I739 Peripheral vascular disease, unspecified: Secondary | ICD-10-CM

## 2021-10-10 DIAGNOSIS — T8131XA Disruption of external operation (surgical) wound, not elsewhere classified, initial encounter: Secondary | ICD-10-CM | POA: Diagnosis not present

## 2021-10-10 DIAGNOSIS — M86271 Subacute osteomyelitis, right ankle and foot: Secondary | ICD-10-CM | POA: Diagnosis not present

## 2021-10-10 DIAGNOSIS — L97512 Non-pressure chronic ulcer of other part of right foot with fat layer exposed: Secondary | ICD-10-CM | POA: Diagnosis not present

## 2021-10-10 NOTE — Progress Notes (Addendum)
[name], [name] ([id number]) Visit Report for 10/10/2021 HPI Details Patient Name: Date of Service: [name] 10/10/2021 10:30 A M Medical Record Number: [id number] Patient Account Number: [id number] Date of Birth/Sex: Treating RN: 1941/02/25 (81 y.o. [name] Primary Care Provider: [name] Other Clinician: Referring Provider: Treating Provider/[name]: Tressie Stalker in Treatment: 16 History of Present Illness HPI Description: ADMISSION 07/13/2019 Patient is a 81 year old type II diabetic. She has known PAD. She has been followed by Dr. Jacqualyn Posey of podiatry for blistering areas on her toes dating back to 04/30/2019 which at this was the left fourth toe. By 8/11 she had wounds on the right and left second toes. She underwent arterial studies by Dr. Alvester Chou on 7/31 that showed ABIs on the right of 0.60 TBI of 0.26 on the left ABI of 0.56 and a TBI of 0.25. By 9/10 she had ischemic-looking wounds per Dr. Earleen Newport on the right first, left first second and third. She has been using Medihoney and then mupirocin more recently simply Betadine. The patient underwent angiography by Dr. Gwenlyn Found on 9/21. 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 Noted that there was 1 vessel runoff in the foot via the peroneal. On the left there was an 80% left SFA, 70% mid left SFA. There was a short segment calcified CTO distal less than SFA and above-knee popliteal artery reconstituting with two-vessel runoff. 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 It was felt that she had bilateral SFA disease as well as tibial vessel disease. An attempt was made to revascularize the left SFA but they were unable to cross because of the highly calcified nature of the lesion. 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 She has an area on the medial left fourth toe with raised horned callus on top of this. I am not certain what this represents. With regards to pain she has about a 2-1/2 I will claudication tolerance in the grocery store. She has some pain in night which is relieved by putting her feet down over the side of the bed. She is wearing Nitro-Dur patches on the top of her right foot. Past medical history includes type 2 diabetes with secondary PAD, neoplasm of the skin, diabetic retinopathy, carotid artery stenosis, hyperlipidemia hypertension. 11/2; the last time the patient was here I spoke to Dr. Gwenlyn Found about revascularization options on the right. As I understand things currently Dr. Gwenlyn Found spoke with Dr. Trula Slade and ultimately the patient was taken to surgery on 10/27. She had a right iliofemoral endarterectomy with a bovine patch angioplasty. I think the plan now is for her to have a staged intervention on the right SFA by Dr. Alvester Chou. Per the patient's understanding Dr. Gwenlyn Found and Dr. Jacqualyn Posey are waiting to see when the gangrenous toes on the right foot can be amputated. The patient states her pain is a lot better and she is grateful for that. 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 Small area on the left fourth toe. She is using Betadine to the mummified areas on the right and Medihoney on the left 08/27/2019; the last time I saw this patient I discharged her from the clinic. She had been revascularized by  Dr. Trula Slade and she had a right iliofemoral endarterectomy with a bovine angioplasty. 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 Jacqualyn Posey of podiatry on 08/07/2019. I note that she also had a intervention by Dr. Gwenlyn Found and he performed a directional atherectomy and drug-coated balloon angioplasty of the SFA and popliteal artery on the right. I am  not certain of the date of Dr. Kennon Holter procedure as of the time of this dictation. She was referred back to [place] by Dr. Earleen Newport predominantly for follow-up of the left fourth toe. She still has sutures and stitches in the right TMA site. She states her pain is a lot better. She expresses concern about the condition of the amputation site at the TMA. She is on doxycycline I think prescribed by Dr. Earleen Newport. She is complaining of some pain at night 12/10; I spoke to Dr. Jacqualyn Posey last week. He removed the sutures on the right foot on Monday of this week. She has the area on the left fourth toe just proximal to the PIP and then the right TMA site. She is still on doxycycline and has enough through next week. Unfortunately the TMA site does not look good at all. Both on the lateral and medial part of the incisions are areas that probe to bone. There is purulence over the medial part which I have cultured. We will use silver alginate. Left fourth toe looks somewhat better but there was still exposed bone 12/17; patient has an MRI booked for 12/30. Culture I did last week showed Pseudomonas Serratia and Enterococcus. This was purulent drainage coming out of the medial part of her amputation site. I use cefdinir 300 twice daily for 10 days that started on 12/15. Her x-ray on the right showed limited evaluation for osteomyelitis. The findings could have been postoperative. There was subtle erosion in the distal first and distal fifth metatarsal. An MRI was suggested. On the left she had irregularity of the fourth middle and proximal phalanx consistent with a history of osteomyelitis. I do not know that she has a history of osteomyelitis in this area. She had a newly defined area on the plantar third toe 12/31; patient's MRI is listed below: MRI OF THE RIGHT FOREFOOT WITHOUT AND WITH CONTRAST TECHNIQUE: Multiplanar, multisequence MR imaging of the right forefoot was performed before and after the administration of  intravenous contrast. CONTRAST 6 mL GADAVIST IV SOLN : COMPARISON: Plain films right foot 09/04/2019. FINDINGS: Bones/Joint/Cartilage The patient is status post transmetatarsal amputation as seen on the prior exam. Marrow edema and enhancement are seen in all of the distal metatarsals. In the first metatarsal, signal change extends 1.5 cm proximal to the stump and in the second metatarsal extends approximately 2 cm proximal to the stump. Edema and enhancement are seen in only the distal 0.7 cm of the third metatarsal stump and tips of the fourth and fifth metatarsals. Bone marrow signal is otherwise unremarkable. A small focus of subchondral edema is seen in the lateral talus, likely degenerative. Ligaments Intact. Muscles and Tendons No intramuscular fluid collection. Soft tissues Skin ulceration is seen off the stump of the first metatarsal. A thin fluid collection tracks deep to the wound and over the anterior metatarsals worrisome for small abscess. Intense subcutaneous edema and enhancement are seen diffusely. 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 Cellulitis about the foot. Skin ulceration  over the distal first metatarsal is identified with a thin fluid collection tracking anteriorly along the stump worrisome for abscess. Small focus of subchondral edema in the lateral talus is likely degenerative. Electronically Signed By: Inge Rise M.D. On: 09/23/2019 15:25 Patient arrives in clinic today not complaining any of any pain. 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 She does not describe claudication however her activity is very limited. 10/01/2019; since the patient was last here I have communicated with Dr. Gwenlyn Found after bypass by Dr. Trula Slade and  addressing the right superficial femoral artery he states that she is widely patent through peroneal arteries to the ankle with collaterals to the dorsalis pedis. He states he is going to talk to colleagues about the feasibility of tibial pedal access. The patient seems infectious disease later this afternoon Dr. Baxter Flattery. in preparation for this she has been off antibiotics for 1 week and I went ahead and obtain pieces of the remanence of her first metatarsal for pathology and CandS. The patient is a candidate for hyperbaric oxygen. 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 1/15; considerable improvement in the wounds on her feet. We are using silver collagen. She follows with Dr. Baxter Flattery of infectious disease and is on meropenem and daptomycin. She has been taught how to do this herself at home. Follows with Dr. Graylon Good at the end of treatment here. She has 2 wounds on the surgical TMA site 1 lateral and 1 medial the lateral 1 has regressed tremendously. The area medially still has some exposed bone although the base of this looks healthy. 1/22; 2 separate wounds on the surgical TMA site. Both of these look satisfactory. The medial area does not have any exposed bone. This is an improvement On the left side fourth toe dorsally over the proximal phalanx there is a deep punched out area that probes to bone. She has an area on the tip of the left third toe. She also tells [place] that in HBO she traumatized her left Achilles and this is left her with a superficial wound area 1/28; weekly visit along with HBO. She has 5 wounds. T our punched out areas on the original TMA site on the right. Both of these appear to have contracted. o The area on the right no longer has exposed bone. She has an area on the tip of the left third toe and on the DIP of the left fourth toe. Both of these had surface callus that I removed and unfortunately they have small areas that both go  to bone. She has a traumatic wound on the left Achilles area that happened in HBO and that appears better. She is tolerating her IV antibiotics well at home. She has home health changing the dressings and she is doing it once on the weekends. We have been using silver collagen. She has been extensively revascularized on the right by Dr. Trula Slade and subsequently by Dr. Gwenlyn Found. 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 Baxter Flattery of infectious disease on 2/17. Dr. Gwenlyn Found on 2/23 and according to her her antibiotics stopped on 2/23. She is tolerating hyperbarics well. She has made a tremendous improvement in the right forefoot with only 2 smaller open wounds. Using silver collagen. On the left  foot she has the area on the tip of the left third toe and the medial part of the left fourth toe. These had exposed bone last week I did not sense any of that today 2/12; sees Dr. Graylon Good on 2/17 and Dr. Gwenlyn Found on 2/23. We are going to use Dermagraft on this today however the lateral part of her train TMA incision on the right is healed and the medial part is down so much that we just continue with the silver collagen She has wounds on the tip of her left third and the medial aspect of the left fourth. Both of these still have exposed bone I have not been able to get these to epithelialize. 2/19; she sees Dr. Gwenlyn Found on 2/23 and I have communicated with him about the left vascular supply. Looking at her angiogram from 08/03/2019 it looks as though that they could not cross the left CFA. Noted that she had one-vessel runoff bilaterally. She also apparently saw Dr. Graylon Good although I did not look up these results for preparation of this record. 2/26; Dr. Gwenlyn Found is going to do an angiogram next week on Wednesday. I think they are going to try to go at this both retrograde and anterograde to see  if anything can be done to revascularize the left lower limb. She continues to make nice progress on the remaining wound on the right medial foot on her TMA site and the toe it wounds on the left are responding nicely as well 3/12; the patient underwent an extensive and complicated revascularization on the left leg by Dr. Fletcher Anon on 3/3; she had an atherectomy on occluded left popliteal artery; anterior tibial artery followed by a balloon angioplasty. Atherectomy was also performed to the left SFA because there was still significant 50% stenosis in the left popliteal artery they performed an intravascular lithotripsy which improved the residual stenosis to 20%. The same lithotripsy was used to dilate the proximal left SFA. She had a drug-eluting stent placed I believe in the SFA. The patient returned for hyperbarics this week. She has had some eye problems on the right which she tells me are secondary to diabetic retinopathy and she saw her eye doctor. She is really made excellent progress there is no open wounds on the left foot at all. She has one open area medially and her TMA site on the right lateral wound is closed. 3/19; the patient comes in today with the area on the medial transmet on the right improved. Some debris removed from the surface revealed the still open wound. Unfortunately she now has the open area on the third toe tip and the medial aspect of the dorsal fourth toe. These are in the same location as her previous wounds. These had actually closed up last week. 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 She is undergone complex revascularizations on the right by Dr. Trula Slade and Dr. Gwenlyn Found and I believe Dr. Fletcher Anon worked on the tibial vessels on the left most recently.she had a complex transmetatarsal amputation wound with underlying osteomyelitis on presentation to the clinic and then developed deep wounds on the plantar left third toe tip and  on the medial part of the left fourth toe at the PIP. She completed IVantibiotics as directed by infectious disease and I believe a course of oral antibiotics as well.she underwent a complete 40 treatments of hyperbarics. She comes in today with the vast majority of the right TMA site healed there is  a small opening on the most medial aspect. Her left third and left fourth toes appear to have epithelialization although this has fluctuated somewhat. 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 She had a gaping wound on the right transmetatarsal amputation L as well as deep wounds on the left third and left fourth toes. She underwent complex revascularizations on her bilateral lower legs which I described on her last visit. She completed IV antibiotics by infectious disease and 40 treatments of hyperbarics. Her left third and fourth toes are healed. The area on the right transmetatarsal amputation site is also closed today. READMISSION 08/04/2020 [name] is now a 81 year old woman that we had for a complex stay in our clinic from October 2000 14 January 2020. 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 Her current problems with wounds began on October 30 when she developed a spontaneous opening over the right transmet medial aspect over the first metatarsal head. She has been using collagen on this that she had leftover from her last stay in this clinic. She also has had an open area on the medial right calf from a vein harvest site from her CABG she. She said that this is never really closed since the vein harvest was done. With regards to her arterial status this is a major issue. She had an angiogram by Dr. Gwenlyn Found in September 2020. She developed a gangrenous right first and second toes. Ultimately she was referred to Dr. Trula Slade who did a right  common femoral endarterectomy with patch angioplasty. This was on 07/21/2019 and she really had a good result. Dr. Gwenlyn Found did a atherectomy followed by a drug coated balloon angioplasty of her right SFA popliteal and tibial peroneal trunk on 08/03/2019 again with excellent excellent results. Follow-up.Dopplers in November revealed revealed a widely patent SFA and tibioperoneal trunk. UNFORTUNATELY her recent angiogram that was done on 08/01/2020 showed an 80% right common femoral artery stenosis just above the previous endarterectomy site occluded right right SFA at the origin reconstituting the adductor canal by the profunda femoris collaterals. The profunda femoris is also diffusely diseased. There is and again another 80% tibioperoneal trunk stenosis and one-vessel runoff via the peroneal artery. She is seen Dr. Donzetta Matters and she is scheduled for a femoral endarterectomy and right femoral-popliteal bypass grafting with endarterectomy of her tibial peroneal trunk. The scheduled surgery is on 11/23 The patient does not complain of pain at rest. She does have 5-minute walking claudication. The wounds on her right first met head remanent was spontaneous she does not think she hit this. As mentioned in the area on her right medial calf is a vein harvest site Dr. Gwenlyn Found had texted me about getting her in the clinic and we have arranged this as soon as we can. I have told her that we will have to see how successful Dr. Donzetta Matters is before we know what can be done about the wounds in a precise fashion. Fortunately there does not seem to be any obvious infection involving the right foot although that may need to be looked at if things really deteriorate. She was treated with IV antibiotics and hyperbaric oxygen for her previous osteomyelitis 12/2; the patient underwent a right femoral endarterectomy and bypass femoral to popliteal artery. This was done on 08/16/2020. She is done remarkably well. She has a  small area on  the right anterior tibia and a small area in the medial part of her original right transmetatarsal amputation. She appears to have had an excellent response to surgery. 12/16; the patient's area on the right tibia and the small area on the medial part of her original right transmetatarsal amputation is totally healed READMISSION 06/15/2021 [name] is a now 81 year old woman who arrives for review of wounds on the left TMA site extending into her plantar foot as well as an area on the left posterior calcaneus. 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 She was extensively revascularized on the right at that time as noted above in our previous notes ended up receiving IV antibiotics and hyperbaric oxygen therapy. Miraculously this TMA site actually closed and she really has had not any trouble with this since. UNFORTUNATELY the same cannot be said of her left side. Her problem apparently started sometime in July when she hit her left third toe on a barstool while playing pool. She developed an acute infection which eventually led to underlying osteomyelitis. She underwent an extensive hospitalization from 04/27/2021 through 05/15/2021. She received IV antibiotics. Also extensive intervention by Dr. Stanford Breed of vein and vascular which included a left common femoral to tibioperoneal trunk bypass as well as a left third toe amputation on 05/03/2021. Unfortunately the area did not heal she ended up requiring a left TMA. By her description this was left open. She has been using a wound VAC on this to earlier this week and then was switched to Santyl. I think because of the left forefoot off loader she is also developed an area on the left posterior calcaneus. She is not complaining of a lot of pain. She has her daughter at home to change the dressings. She has been prescribed Santyl and she has a tube that she is brought  into our clinic. I cannot get a lot of history of claudication although she is really not that mobile at this point walking with a walker. 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 She has had multiple interventions on the right side by vein and vascular and also by Dr. Gwenlyn Found as well as a transmetatarsal amputation on the right. She has had follow-up noninvasive studies on 06/06/2021; on the left that showed an ABI of 0.47 with monophasic waveforms a dorsalis pedis ABI of 0.40 with monophasic waveforms. Her ANGIOGRAM which was initially done by Dr. Oneida Alar on 04/28/2021 showed severe left foot superficial femoral artery occlusive disease and an occlusion of the left popliteal artery with two-vessel runoff to the left foot with moderate to severe disease below the knee popliteal artery and tibial disease. She had 80% distal right external iliac artery above the existing femoral above-knee popliteal bypass but she does not have an open wound on the right foot She arrives in clinic with an extensive open area on the TMA site which in the mid aspect extends into the plantar foot. She also has a more superficial area on the upper part of her Achilles area of the left heel 9/29; patient arrives in clinic today with slough over the TMA site. We use MolecuLight to look at the surface of this suggesting cyan and white discoloration over a large part of this wound also extending into  the third metatarsal area. She also has the area on the left posterior calcaneus. This is slough covered we will switch to [facility] here as well. There is some warmth in the area and some erythema we will give her antibiotics as well 10/6; completely necrotic surface once again on the left TMA site. We have been using Santyl and Hydrofera Blue. She has an area on the left posterior Achilles and  actually states this has been more painful this week indeed there is some erythema around this area. She also describes I think claudication at rest which is relieved by dipping her left leg over the side of the bed at night. 10/13; our intake nurse was able to brush some slough off the surface of the left TMA hence this did not require debridement that is better than last week. The area heading towards the plantar part of her foot required an aggressive debridement to clean this up. This is quite a sizable divot but does not go to bone. The area on the right Achilles heel is also covered in a high 100% nonviable tissue we have been using Santyl and silver alginate 10/20; the area on the left TMA looks much better. Even the area is spreading into the midfoot looks improved. The right Achilles still is covered and nonviable surface 100%. We have been using Santyl and silver alginate and this will continue for this week The patient is approved for 5 Apligraf's and I think she is good enough to start using these next week 10/27; the patient's wounds actually look quite good. We have been using Santyl and silver alginate. We applied Apligraf #1 today Saw Dr. Stanford Breed her vascular surgeon on 10/25 he told her TMA was healing nicely. She will follow-up with them in December to repeat noninvasive testing. 08/16/2021 upon evaluation today patient appears to be doing much better in regard to her wounds. Both are showing signs of improvement which is great news. I am going to perform some debridement to clear away some of the necrotic debris currently this will just be a light debridement. Nonetheless other than that I feel like that the Apligraf is doing quite well working and reapply today and this will be #3 as far as the application is concerned. 12/20; since the patient was last here she was seen by Dr. Stanford Breed who felt that her Doppler flow was sluggish in the bypass. She therefore went underwent a urgent  angiogram on 09/07/2021 by Dr. Carlis Abbott. She had an angioplasty of the distal anastomosis and the anterior tibial artery. She tolerated this well. She had no flow-limiting stenosis in the aortoiliac segment on the left. Some calcification in the left common iliac artery but it was not flow-limiting the common femoral and profunda were patent. The left lower extremity bypass had brisk flow with no significant proximal anastomosis on the common femoral artery. Distally there was a high-grade stenosis where the bypass anastomosis was performed. This was angioplastied as well as the anterior tibial artery. 12/28; the patient comes in early because of a wound over the tip of her right first metatarsal head. She is not really sure how this happened. Says she noticed it when she took her sock off on Friday before she got in the shower. She has been using Hydrofera Blue to this area. This is the original wounded site we dealt with in the setting of a previous right TMA. The patient has PA I researched this on [place] link. As it turns out  she has had recent arterial studies noninvasive on 09/05/2021. On the right she had dampened monophasic waveforms at the popliteal absent waveforms at the PTA and dorsalis pedis. An ABI could not be obtained previously at 0.47. The comment states that they were unable to detect Doppler waveforms of the posterior tibial and dorsalis pedis arteries. 2023 09/26/2021; her original left TMA wound is almost closed. I reapplied Apligraf to the remaining area. The area on the plantar foot is also closed as far as I can tell HOWEVER she comes in with a second wound on the right first metatarsal head remanent both of these probe to bone. I did a bone scraping of this last week which only showed Corynebacterium unlikely to be a true pathogen. Nevertheless I am going to put her on Augmentin today probably for 2 weeks. X-ray will be done today. She has an appointment with Dr. Gwenlyn Saran at vein  and vascular next Tuesday I will send him a note about my concerns about the right TM 1/10; we inadvertently remove the Apligraf from the left surgical site however everything looks closed here. She still has 2 probing wounds over the remanent of her first metatarsal head both go to bone although there is less erythema and swelling in the area. She does not have an appointment with Dr. Donzetta Matters until next Tuesday. I am going to go ahead and order an MRI of the right foot I had originally planned to have or at least consider HBO if the patient has underlying osteomyelitis in the right foot however she tells me that she lost a significant amount of her visual field during her last run with HBO in the right eye nasal aspect. She saw Dr. Zadie Rhine who is a retinal specialist I will see if I can look at his records. She is still on Augmentin as finishes on Monday. 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 She has been on Augmentin with some improvement in the surrounding erythema although the small wound on the tip and the plantar aspect still probe easily to bone and there does not appear to be any viable tissue. UNFORTUNATELY arrives in clinic today with new areas on the right lateral foot neither 1 of these look particularly healthy. No debridement. The patient has an appoint with Dr. Donzetta Matters this morning my question is angiography of the right lego. I also wonder about an infectious disease appointment and I will continue her Augmentin but refer her for consideration. I did not get a culture that was positive even from a bone scraping however. 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 She saw her retina doctor Dr. Deloria Lair and her interpretation of this was that the hyperbarics may have contributed to a blockage. I wonder whether this was a central retinal vein or artery occlusion. I am not sure that Dr. Zadie Rhine documents in epic I will need to see if I can find his note Electronic Signature(s) Signed: 10/10/2021 4:48:32 PM By: [name] MD Entered By: [name] on 10/10/2021 12:41:44 -------------------------------------------------------------------------------- Physical Exam Details Patient Name: Date of Service: [name], [name]  10/10/2021 10:30 A M Medical Record Number: [id number] Patient Account Number: [id number] Date of Birth/Sex: Treating RN: 1941-07-16 (81 y.o. [name] Primary Care Provider: [name] Other Clinician: Referring Provider: Treating Provider/[name]: Tressie Stalker in Treatment: 32 Constitutional Patient is hypertensive.. Pulse regular and within target range for patient.Marland Kitchen Respirations regular, non-labored and within target range.. Temperature is normal and within the target range for the patient.Marland Kitchen Appears in no distress. Notes Wound exam The area on the left transmit site is totally closed including the divot in the middle of the wound towards the center of her foot. This is epithelialized if you spread this open. The small area on the left posterior heel is just about closed The 2 areas on the right first met head still probes to bone there is less erythema around the area on the Augmentin She has new wounds on the right lateral foot one of them with eschar and another with necrotic tissue. These are small areas no obvious infection here. Electronic Signature(s) Signed: 10/10/2021 4:48:32 PM By: [name] MD Entered By: [name] on 10/10/2021  12:47:03 -------------------------------------------------------------------------------- Physician Orders Details Patient Name: Date of Service: [name], [place]. 10/10/2021 10:30 A M Medical Record Number: [id number] Patient Account Number: [id number] Date of Birth/Sex: Treating RN: 01-Dec-1940 (81 y.o. [name] Primary Care Provider: [name] Other Clinician: Referring Provider: Treating Provider/[name]: Tressie Stalker in Treatment: [phone] Verbal / Phone Orders: No Diagnosis Coding Follow-up Appointments ppointment in 1 week. - with Dr. Dellia Nims Return A Bathing/ Shower/ Hygiene May shower with protection but do not get wound dressing(s) wet. Edema Control - Lymphedema / SCD / Other Elevate legs to the level of the heart or above for 30 minutes daily and/or when sitting, a frequency of: - throughout the day Avoid standing for long periods of time. Home Health New wound care orders this week; continue Home Health for wound care. May utilize formulary equivalent dressing for wound treatment orders unless otherwise specified. Dressing changes to be completed by [place] on Monday / Wednesday / Friday except when patient has scheduled visit at [facility]. Other Home Health Orders/Instructions: - [facility] Wound Treatment Wound #14 - Calcaneus Wound Laterality: Left, Posterior Cleanser: Wound Cleanser (Home Health) 2 x Per Week/15 Days Discharge Instructions: Cleanse the wound with wound cleanser prior to applying a clean dressing using gauze sponges, not tissue or cotton balls. Prim Dressing: Promogran Prisma Matrix, 4.34 (sq in) (silver collagen) 2 x Per Week/15 Days ary Discharge Instructions: Moisten collagen with saline or hydrogel Secondary Dressing: Woven Gauze Sponge, Non-Sterile 4x4 in (Home Health) 2 x Per Week/15 Days Discharge Instructions: Apply over primary dressing as directed. Secondary Dressing: ABD Pad, 5x9 (Home  Health) 2 x Per Week/15 Days Discharge Instructions: Apply over primary dressing as directed. Secured With: [employer], 4.5x3.1 (in/yd) (Home Health) 2 x Per Week/15 Days Discharge Instructions: Secure with Kerlix as directed. Secured With: Paper Tape, 2x10 (in/yd) (Home Health) 2 x Per Week/15 Days Discharge Instructions: Secure dressing with tape as directed. Wound #15 - Amputation Site - Transmetatarsal Wound Laterality: Right Cleanser: Soap and Water [facility]) Every Other Day/30 Days Discharge Instructions: May shower and wash wound with dial antibacterial soap and water prior to dressing change. Prim Dressing: Promogran Prisma Matrix, 4.34 (sq in) (silver collagen) (Home Health) Every Other Day/30 Days ary Discharge Instructions: Moisten collagen with [place] Jelly Secondary Dressing: Woven Gauze Sponges 2x2 in [facility]) Every Other  Day/30 Days Discharge Instructions: Apply over primary dressing as directed. Secured With: [employer], 4.5x3.1 (in/yd) [facility]) Every Other Day/30 Days Discharge Instructions: Secure with Kerlix as directed. Secured With: Transpore Surgical Tape, 2x10 (in/yd) [facility]) Every Other Day/30 Days Discharge Instructions: Secure dressing with tape as directed. Secured With: Netting Every Other Day/30 Days Wound #16 - Foot Wound Laterality: Plantar, Right Cleanser: Soap and Water [facility]) Every Other Day/30 Days Discharge Instructions: May shower and wash wound with dial antibacterial soap and water prior to dressing change. Prim Dressing: Promogran Prisma Matrix, 4.34 (sq in) (silver collagen) (Home Health) Every Other Day/30 Days ary Discharge Instructions: Moisten collagen with [place] Jelly Secondary Dressing: Woven Gauze Sponges 2x2 in [facility]) Every Other Day/30 Days Discharge Instructions: Apply over primary dressing as directed. Secured With: [employer], 4.5x3.1 (in/yd) [facility]) Every Other Day/30  Days Discharge Instructions: Secure with Kerlix as directed. Secured With: Transpore Surgical Tape, 2x10 (in/yd) [facility]) Every Other Day/30 Days Discharge Instructions: Secure dressing with tape as directed. Secured With: Netting Every Other Day/30 Days Consults Infectious Disease - Urgent******** Pt. 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 Patient Medications llergies: Demerol, scopolamine, Bactrim, Versed, tramadol A Notifications Medication Indication Start End wound infection right 10/10/2021 Augmentin foot DOSE oral 875 mg-125 mg tablet - 1 tablet oral bid for 10 days Electronic Signature(s) Signed: 10/10/2021 12:53:11 PM By: [name] MD Entered By: [name] on 10/10/2021 12:53:10 Prescription 10/10/2021 -------------------------------------------------------------------------------- Adella Nissen MD Patient Name: Provider: 09/30/1940 [id number] Date of Birth: NPI#Jesse Sans Sex: DEA #: [phone] [id number] Phone #: License #: [place] Patient Address: [address] [place], [place] [address] [place], [place] [address] [phone] Allergies Demerol; scopolamine; Bactrim; Versed; tramadol Provider's Orders Infectious Disease - Urgent******** Pt. 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 Hand Signature: Date(s): Electronic Signature(s) Signed: 10/10/2021 5:55:40 PM By: Dellie Catholic RN Signed: 10/11/2021 3:43:46 PM By: [name] MD Previous Signature: 10/10/2021 4:48:32 PM Version By: [name] MD Entered By: Dellie Catholic on 10/10/2021 17:32:15 -------------------------------------------------------------------------------- Problem List Details Patient Name: Date of Service: [name], [name] 10/10/2021 10:30 A M Medical Record Number: [id number] Patient Account Number: [id number] Date of Birth/Sex: Treating RN: 1941/05/01 (81 y.o. [name] Primary Care Provider: [name] Other Clinician: Referring Provider: Treating Provider/[name]: Tressie Stalker in Treatment: 16 Active Problems ICD-10 Encounter Code Description Active Date MDM Diagnosis E11.621 Type 2 diabetes mellitus with foot ulcer 06/15/2021 No Yes T81.31XD Disruption of external operation (surgical) wound, not elsewhere classified, 06/15/2021 No Yes subsequent encounter L97.514 Non-pressure chronic ulcer of other part of right foot with necrosis of bone 09/20/2021 No Yes L97.428 Non-pressure chronic ulcer of left heel and midfoot with other specified 06/15/2021 No Yes severity E11.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 06/15/2021 No Yes Inactive Problems ICD-10 Code Description Active Date Inactive Date L97.528 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ity 06/15/2021 06/15/2021 Resolved Problems Electronic Signature(s) Signed: 10/10/2021 4:48:32 PM By: [name] MD Entered By: [name] on 10/10/2021 12:35:37 -------------------------------------------------------------------------------- Progress Note Details Patient Name: Date of Service: [name], [name] 10/10/2021 10:30 A M Medical Record Number: [id number] Patient Account Number: [id number] Date of Birth/Sex: Treating RN: 30-Jul-1941 (81 y.o. [name] Primary Care Provider: [name] Other Clinician: Referring Provider: Treating Provider/[name]: Tressie Stalker  in Treatment: 16 Subjective History of Present Illness (HPI) ADMISSION 07/13/2019 Patient is a 81 year old type II diabetic. She has known PAD. She has been followed by Dr. Jacqualyn Posey of podiatry for blistering areas on her toes dating back to 04/30/2019 which at this was the left fourth toe. By 8/11 she had wounds on the right and left second toes. She underwent  arterial studies by Dr. Alvester Chou on 7/31 that showed ABIs on the right of 0.60 TBI of 0.26 on the left ABI of 0.56 and a TBI of 0.25. By 9/10 she had ischemic-looking wounds per Dr. Earleen Newport on the right first, left first second and third. She has been using Medihoney and then mupirocin more recently simply Betadine. The patient underwent angiography by Dr. Gwenlyn Found on 9/21. 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 Noted that there was 1 vessel runoff in the foot via the peroneal. ooOn the left there was an 80% left SFA, 70% mid left SFA. There was a short segment calcified CTO distal less than SFA and above-knee popliteal artery reconstituting with two-vessel runoff. 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 It was felt that she had bilateral SFA disease as well as tibial vessel disease. An attempt was made to revascularize the left SFA but they were unable to cross because of the highly calcified nature of the lesion. 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 She has an area on the medial left fourth toe with raised horned callus on top of this. I am not certain what this represents. With regards to pain she has about a 2-1/2 I will claudication tolerance in the grocery store. She has some pain in night which is relieved by putting her feet down over the side of the bed. She is wearing Nitro-Dur patches on the top of her right foot. Past medical history includes type 2 diabetes with secondary PAD, neoplasm of the skin, diabetic retinopathy, carotid artery stenosis, hyperlipidemia hypertension. 11/2; the last time the patient was here I spoke to Dr. Gwenlyn Found about revascularization options on the right. As I understand things currently Dr. Gwenlyn Found spoke with Dr. Trula Slade  and ultimately the patient was taken to surgery on 10/27. She had a right iliofemoral endarterectomy with a bovine patch angioplasty. I think the plan now is for her to have a staged intervention on the right SFA by Dr. Alvester Chou. Per the patient's understanding Dr. Gwenlyn Found and Dr. Jacqualyn Posey are waiting to see when the gangrenous toes on the right foot can be amputated. The patient states her pain is a lot better and she is grateful for that. 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 Small area on the left fourth toe. She is using Betadine to the mummified areas on the right and Medihoney on the left 08/27/2019; the last time I saw this patient I discharged her from the clinic. She had been revascularized by Dr. Trula Slade and she had a right iliofemoral endarterectomy with a bovine angioplasty. 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 Jacqualyn Posey of podiatry on 08/07/2019. I note that she also had a intervention by Dr. Gwenlyn Found and he performed a directional atherectomy and drug-coated balloon angioplasty of the SFA and  popliteal artery on the right. I am not certain of the date of Dr. Kennon Holter procedure as of the time of this dictation. She was referred back to [place] by Dr. Earleen Newport predominantly for follow-up of the left fourth toe. She still has sutures and stitches in the right TMA site. She states her pain is a lot better. She expresses concern about the condition of the amputation site at the TMA. She is on doxycycline I think prescribed by Dr. Earleen Newport. She is complaining of some pain at night 12/10; I spoke to Dr. Jacqualyn Posey last week. He removed the sutures on the right foot on Monday of this week. She has the area on the left fourth toe just proximal to the PIP and then the right TMA site. She is still on doxycycline and has enough through next week. Unfortunately the TMA site does not look good at all. Both on the lateral and medial  part of the incisions are areas that probe to bone. There is purulence over the medial part which I have cultured. We will use silver alginate. Left fourth toe looks somewhat better but there was still exposed bone 12/17; patient has an MRI booked for 12/30. Culture I did last week showed Pseudomonas Serratia and Enterococcus. This was purulent drainage coming out of the medial part of her amputation site. I use cefdinir 300 twice daily for 10 days that started on 12/15. Her x-ray on the right showed limited evaluation for osteomyelitis. The findings could have been postoperative. There was subtle erosion in the distal first and distal fifth metatarsal. An MRI was suggested. On the left she had irregularity of the fourth middle and proximal phalanx consistent with a history of osteomyelitis. I do not know that she has a history of osteomyelitis in this area. She had a newly defined area on the plantar third toe 12/31; patient's MRI is listed below: MRI OF THE RIGHT FOREFOOT WITHOUT AND WITH CONTRAST TECHNIQUE: Multiplanar, multisequence MR imaging of the right forefoot was performed before and after the administration of intravenous contrast. CONTRAST 6 mL GADAVIST IV SOLN : COMPARISON: Plain films right foot 09/04/2019. FINDINGS: Bones/Joint/Cartilage The patient is status post transmetatarsal amputation as seen on the prior exam. Marrow edema and enhancement are seen in all of the distal metatarsals. In the first metatarsal, signal change extends 1.5 cm proximal to the stump and in the second metatarsal extends approximately 2 cm proximal to the stump. Edema and enhancement are seen in only the distal 0.7 cm of the third metatarsal stump and tips of the fourth and fifth metatarsals. Bone marrow signal is otherwise unremarkable. A small focus of subchondral edema is seen in the lateral talus, likely degenerative. Ligaments Intact. Muscles and Tendons No intramuscular fluid  collection. Soft tissues Skin ulceration is seen off the stump of the first metatarsal. A thin fluid collection tracks deep to the wound and over the anterior metatarsals worrisome for small abscess. Intense subcutaneous edema and enhancement are seen diffusely. 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 Cellulitis about the foot. Skin ulceration over the distal first metatarsal is identified with a thin fluid collection tracking anteriorly along the stump worrisome for abscess. Small focus of subchondral edema in the lateral talus is likely degenerative. Electronically Signed By: Inge Rise M.D. On: 09/23/2019 15:25 Patient arrives in clinic today not complaining any of any pain. We have been using silver alginate  to the predominant areas in the surgical site on her right transmetatarsal amputation. She does not describe claudication however her activity is very limited. 10/01/2019; since the patient was last here I have communicated with Dr. Gwenlyn Found after bypass by Dr. Trula Slade and addressing the right superficial femoral artery he states that she is widely patent through peroneal arteries to the ankle with collaterals to the dorsalis pedis. He states he is going to talk to colleagues about the feasibility of tibial pedal access. The patient seems infectious disease later this afternoon Dr. Baxter Flattery. in preparation for this she has been off antibiotics for 1 week and I went ahead and obtain pieces of the remanence of her first metatarsal for pathology and CandS. The patient is a candidate for hyperbaric oxygen. 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 1/15; considerable improvement in the wounds on her feet. We are using silver collagen. She follows with Dr. Baxter Flattery of infectious disease and is  on meropenem and daptomycin. She has been taught how to do this herself at home. Follows with Dr. Graylon Good at the end of treatment here. She has 2 wounds on the surgical TMA site 1 lateral and 1 medial the lateral 1 has regressed tremendously. The area medially still has some exposed bone although the base of this looks healthy. 1/22; 2 separate wounds on the surgical TMA site. Both of these look satisfactory. The medial area does not have any exposed bone. This is an improvement On the left side fourth toe dorsally over the proximal phalanx there is a deep punched out area that probes to bone. She has an area on the tip of the left third toe. She also tells [place] that in HBO she traumatized her left Achilles and this is left her with a superficial wound area 1/28; weekly visit along with HBO. She has 5 wounds. T our punched out areas on the original TMA site on the right. Both of these appear to have contracted. o The area on the right no longer has exposed bone. She has an area on the tip of the left third toe and on the DIP of the left fourth toe. Both of these had surface callus that I removed and unfortunately they have small areas that both go to bone. She has a traumatic wound on the left Achilles area that happened in HBO and that appears better. She is tolerating her IV antibiotics well at home. She has home health changing the dressings and she is doing it once on the weekends. We have been using silver collagen. She has been extensively revascularized on the right by Dr. Trula Slade and subsequently by Dr. Gwenlyn Found. 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 Baxter Flattery of infectious disease on 2/17. Dr. Gwenlyn Found on 2/23 and according to her her antibiotics stopped on 2/23. She is tolerating hyperbarics well. She has made a tremendous improvement in the right forefoot with only  2 smaller open wounds. Using silver collagen. On the left foot she has the area on the tip of the left third toe and the medial part of the left fourth toe. These had exposed bone last week I did not sense any of that today 2/12; sees Dr. Graylon Good on 2/17 and Dr. Gwenlyn Found on 2/23. We are going to use Dermagraft on this today however the  lateral part of her train TMA incision on the right is healed and the medial part is down so much that we just continue with the silver collagen She has wounds on the tip of her left third and the medial aspect of the left fourth. Both of these still have exposed bone I have not been able to get these to epithelialize. 2/19; she sees Dr. Gwenlyn Found on 2/23 and I have communicated with him about the left vascular supply. Looking at her angiogram from 08/03/2019 it looks as though that they could not cross the left CFA. Noted that she had one-vessel runoff bilaterally. She also apparently saw Dr. Graylon Good although I did not look up these results for preparation of this record. 2/26; Dr. Gwenlyn Found is going to do an angiogram next week on Wednesday. I think they are going to try to go at this both retrograde and anterograde to see if anything can be done to revascularize the left lower limb. She continues to make nice progress on the remaining wound on the right medial foot on her TMA site and the toe it wounds on the left are responding nicely as well 3/12; the patient underwent an extensive and complicated revascularization on the left leg by Dr. Fletcher Anon on 3/3; she had an atherectomy on occluded left popliteal artery; anterior tibial artery followed by a balloon angioplasty. Atherectomy was also performed to the left SFA because there was still significant 50% stenosis in the left popliteal artery they performed an intravascular lithotripsy which improved the residual stenosis to 20%. The same lithotripsy was used to dilate the proximal left SFA. She had a drug-eluting stent placed I  believe in the SFA. The patient returned for hyperbarics this week. She has had some eye problems on the right which she tells me are secondary to diabetic retinopathy and she saw her eye doctor. She is really made excellent progress there is no open wounds on the left foot at all. She has one open area medially and her TMA site on the right lateral wound is closed. 3/19; the patient comes in today with the area on the medial transmet on the right improved. Some debris removed from the surface revealed the still open wound. ooUnfortunately she now has the open area on the third toe tip and the medial aspect of the dorsal fourth toe. These are in the same location as her previous wounds. These had actually closed up last week. 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 She is undergone complex revascularizations on the right by Dr. Trula Slade and Dr. Gwenlyn Found and I believe Dr. 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 She completed IVantibiotics as directed by infectious disease and I believe a course of oral antibiotics as well.she underwent a complete 40 treatments of hyperbarics. She comes in today with the vast majority of the right TMA site healed there is a small opening on the most medial aspect. Her left third and left fourth toes appear to have epithelialization although this has fluctuated somewhat. 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 She had a gaping wound on the right transmetatarsal amputation L as well as deep wounds on the left third and left fourth toes. She underwent complex  revascularizations on her bilateral lower legs which I described on her last visit. She completed IV  antibiotics by infectious disease and 40 treatments of hyperbarics. Her left third and fourth toes are healed. The area on the right transmetatarsal amputation site is also closed today. READMISSION 08/04/2020 [name] is now a 81 year old woman that we had for a complex stay in our clinic from October 2000 14 January 2020. 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 Her current problems with wounds began on October 30 when she developed a spontaneous opening over the right transmet medial aspect over the first metatarsal head. She has been using collagen on this that she had leftover from her last stay in this clinic. She also has had an open area on the medial right calf from a vein harvest site from her CABG she. She said that this is never really closed since the vein harvest was done. With regards to her arterial status this is a major issue. She had an angiogram by Dr. Gwenlyn Found in September 2020. She developed a gangrenous right first and second toes. Ultimately she was referred to Dr. Trula Slade who did a right common femoral endarterectomy with patch angioplasty. This was on 07/21/2019 and she really had a good result. Dr. Gwenlyn Found did a atherectomy followed by a drug coated balloon angioplasty of her right SFA popliteal and tibial peroneal trunk on 08/03/2019 again with excellent excellent results. Follow-up.Dopplers in November revealed revealed a widely patent SFA and tibioperoneal trunk. UNFORTUNATELY her recent angiogram that was done on 08/01/2020 showed an 80% right common femoral artery stenosis just above the previous endarterectomy site occluded right right SFA at the origin reconstituting the adductor canal by the profunda femoris collaterals. The profunda femoris is also diffusely diseased. There is and again another 80% tibioperoneal trunk stenosis  and one-vessel runoff via the peroneal artery. She is seen Dr. Donzetta Matters and she is scheduled for a femoral endarterectomy and right femoral-popliteal bypass grafting with endarterectomy of her tibial peroneal trunk. The scheduled surgery is on 11/23 The patient does not complain of pain at rest. She does have 5-minute walking claudication. The wounds on her right first met head remanent was spontaneous she does not think she hit this. As mentioned in the area on her right medial calf is a vein harvest site Dr. Gwenlyn Found had texted me about getting her in the clinic and we have arranged this as soon as we can. I have told her that we will have to see how successful Dr. Donzetta Matters is before we know what can be done about the wounds in a precise fashion. Fortunately there does not seem to be any obvious infection involving the right foot although that may need to be looked at if things really deteriorate. She was treated with IV antibiotics and hyperbaric oxygen for her previous osteomyelitis 12/2; the patient underwent a right femoral endarterectomy and bypass femoral to popliteal artery. This was done on 08/16/2020. She is done remarkably well. She has a small area on the right anterior tibia and a small area in the medial part of her original right transmetatarsal amputation. She appears to have had an excellent response to surgery. 12/16; the patient's area on the right tibia and the small area on the medial part of her original right transmetatarsal amputation is totally healed READMISSION 06/15/2021  [name] is a now 81 year old woman who arrives for review of wounds on the left TMA site extending into her plantar foot as well as an area on the left posterior calcaneus. 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 She was extensively revascularized on the right at that time as noted above in our previous notes ended up  receiving IV antibiotics and hyperbaric oxygen therapy. Miraculously this TMA site actually closed and she really has had not any trouble with this since. UNFORTUNATELY the same cannot be said of her left side. Her problem apparently started sometime in July when she hit her left third toe on a barstool while playing pool. She developed an acute infection which eventually led to underlying osteomyelitis. She underwent an extensive hospitalization from 04/27/2021 through 05/15/2021. She received IV antibiotics. Also extensive intervention by Dr. Stanford Breed of vein and vascular which included a left common femoral to tibioperoneal trunk bypass as well as a left third toe amputation on 05/03/2021. Unfortunately the area did not heal she ended up requiring a left TMA. By her description this was left open. She has been using a wound VAC on this to earlier this week and then was switched to Santyl. I think because of the left forefoot off loader she is also developed an area on the left posterior calcaneus. She is not complaining of a lot of pain. She has her daughter at home to change the dressings. She has been prescribed Santyl and she has a tube that she is brought into our clinic. I cannot get a lot of history of claudication although she is really not that mobile at this point walking with a walker. 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 She has had multiple interventions on the right side by vein and vascular and also by Dr. Gwenlyn Found as well as a transmetatarsal amputation on the right. She has had follow-up noninvasive studies on 06/06/2021; on the left that showed an ABI of 0.47 with monophasic waveforms a dorsalis pedis ABI of 0.40 with monophasic waveforms. Her ANGIOGRAM which was initially done by Dr. Oneida Alar on 04/28/2021 showed severe left foot superficial femoral  artery occlusive disease and an occlusion of the left popliteal artery with two-vessel runoff to the left foot with moderate to severe disease below the knee popliteal artery and tibial disease. She had 80% distal right external iliac artery above the existing femoral above-knee popliteal bypass but she does not have an open wound on the right foot She arrives in clinic with an extensive open area on the TMA site which in the mid aspect extends into the plantar foot. She also has a more superficial area on the upper part of her Achilles area of the left heel 9/29; patient arrives in clinic today with slough over the TMA site. We use [employer] to look at the surface of this suggesting cyan and white discoloration over a large part of this wound also extending into the third metatarsal area. She also has the area on the left posterior calcaneus. This is slough covered we will switch to [facility] here as well. There is some warmth in the area and some erythema we will give her antibiotics as well 10/6; completely necrotic surface once again on the left TMA site. We have been using  Santyl and Hydrofera Blue. She has an area on the left posterior Achilles and actually states this has been more painful this week indeed there is some erythema around this area. She also describes I think claudication at rest which is relieved by dipping her left leg over the side of the bed at night. 10/13; our intake nurse was able to brush some slough off the surface of the left TMA hence this did not require debridement that is better than last week. The area heading towards the plantar part of her foot required an aggressive debridement to clean this up. This is quite a sizable divot but does not go to bone. The area on the right Achilles heel is also covered in a high 100% nonviable tissue we have been using Santyl and silver alginate 10/20; the area on the left TMA looks much better. Even the area is spreading into the  midfoot looks improved. The right Achilles still is covered and nonviable surface 100%. We have been using Santyl and silver alginate and this will continue for this week The patient is approved for 5 Apligraf's and I think she is good enough to start using these next week 10/27; the patient's wounds actually look quite good. We have been using Santyl and silver alginate. We applied Apligraf #1 today Saw Dr. Stanford Breed her vascular surgeon on 10/25 he told her TMA was healing nicely. She will follow-up with them in December to repeat noninvasive testing. 08/16/2021 upon evaluation today patient appears to be doing much better in regard to her wounds. Both are showing signs of improvement which is great news. I am going to perform some debridement to clear away some of the necrotic debris currently this will just be a light debridement. Nonetheless other than that I feel like that the Apligraf is doing quite well working and reapply today and this will be #3 as far as the application is concerned. 12/20; since the patient was last here she was seen by Dr. Stanford Breed who felt that her Doppler flow was sluggish in the bypass. She therefore went underwent a urgent angiogram on 09/07/2021 by Dr. Carlis Abbott. She had an angioplasty of the distal anastomosis and the anterior tibial artery. She tolerated this well. She had no flow-limiting stenosis in the aortoiliac segment on the left. Some calcification in the left common iliac artery but it was not flow-limiting the common femoral and profunda were patent. The left lower extremity bypass had brisk flow with no significant proximal anastomosis on the common femoral artery. Distally there was a high-grade stenosis where the bypass anastomosis was performed. This was angioplastied as well as the anterior tibial artery. 12/28; the patient comes in early because of a wound over the tip of her right first metatarsal head. She is not really sure how this happened. Says  she noticed it when she took her sock off on Friday before she got in the shower. She has been using Hydrofera Blue to this area. This is the original wounded site we dealt with in the setting of a previous right TMA. The patient has PA I researched this on [employer] link. As it turns out she has had recent arterial studies noninvasive on 09/05/2021. On the right she had dampened monophasic waveforms at the popliteal absent waveforms at the PTA and dorsalis pedis. An ABI could not be obtained previously at 0.47. The comment states that they were unable to detect Doppler waveforms of the posterior tibial and dorsalis pedis arteries. 2023 09/26/2021;  her original left TMA wound is almost closed. I reapplied Apligraf to the remaining area. The area on the plantar foot is also closed as far as I can tell HOWEVER she comes in with a second wound on the right first metatarsal head remanent both of these probe to bone. I did a bone scraping of this last week which only showed Corynebacterium unlikely to be a true pathogen. Nevertheless I am going to put her on Augmentin today probably for 2 weeks. X-ray will be done today. She has an appointment with Dr. Gwenlyn Saran at vein and vascular next Tuesday I will send him a note about my concerns about the right TM 1/10; we inadvertently remove the Apligraf from the left surgical site however everything looks closed here. She still has 2 probing wounds over the remanent of her first metatarsal head both go to bone although there is less erythema and swelling in the area. She does not have an appointment with Dr. Donzetta Matters until next Tuesday. I am going to go ahead and order an MRI of the right foot I had originally planned to have or at least consider HBO if the patient has underlying osteomyelitis in the right foot however she tells me that she lost a significant amount of her visual field during her last run with HBO in the right eye nasal aspect. She saw Dr. Zadie Rhine who is a  retinal specialist I will see if I can look at his records. She is still on Augmentin as finishes on Monday. 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 She has been on Augmentin with some improvement in the surrounding erythema although the small wound on the tip and the plantar aspect still probe easily to bone and there does not appear to be any viable tissue. UNFORTUNATELY arrives in clinic today with new areas on the right lateral foot neither 1 of these look particularly healthy. No debridement. The patient has an appoint with Dr. Donzetta Matters this morning my question is angiography of the right lego. I also wonder about an infectious disease appointment and I will continue her Augmentin but refer her for consideration. I did not get a culture that was positive even from a bone scraping however. 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 She saw her retina doctor Dr. Deloria Lair and her interpretation of this was that the hyperbarics may have contributed to a blockage. I wonder whether this was a central retinal vein or artery occlusion. I am not sure that Dr. Zadie Rhine documents in epic I will need to see if I can find his note Objective Constitutional Patient is hypertensive.. Pulse regular and within target range for patient.Marland Kitchen Respirations regular, non-labored and within target range.. Temperature is normal and within the target range for the patient.Marland Kitchen Appears in no distress. Vitals Time Taken: 10:43 AM, Height: 64 in, Weight: 135 lbs, BMI: 23.2, Temperature: 97.6 F, Pulse: 82 bpm, Respiratory Rate: 18 breaths/min,  Blood Pressure: 162/68 mmHg. General Notes: Wound exam oo The area on the left transmit site is totally closed including the divot in the middle of the wound towards the center  of her foot. This is epithelialized if you spread this open. oo The small area on the left posterior heel is just about closed oo The 2 areas on the right first met head still probes to bone there is less erythema around the area on the Augmentin oo She has new wounds on the right lateral foot one of them with eschar and another with necrotic tissue. These are small areas no obvious infection here. Integumentary (Hair, Skin) Wound #13 status is Healed - Epithelialized. Original cause of wound was Surgical Injury. The date acquired was: 05/11/2021. The wound has been in treatment 16 weeks. The wound is located on the Left Amputation Site - Transmetatarsal. The wound measures 0cm length x 0cm width x 0cm depth; 0cm^2 area and 0cm^3 volume. There is Fat Layer (Subcutaneous Tissue) exposed. There is a medium amount of serosanguineous drainage noted. The wound margin is distinct with the outline attached to the wound base. There is no granulation within the wound bed. There is no necrotic tissue within the wound bed. Wound #14 status is Open. Original cause of wound was Pressure Injury. The date acquired was: 05/11/2021. The wound has been in treatment 16 weeks. The wound is located on the Left,Posterior Calcaneus. The wound measures 0.1cm length x 0.1cm width x 0.1cm depth; 0.008cm^2 area and 0.001cm^3 volume. There is Fat Layer (Subcutaneous Tissue) exposed. There is a medium amount of serosanguineous drainage noted. The wound margin is flat and intact. There is large (67-100%) red granulation within the wound bed. There is a small (1-33%) amount of necrotic tissue within the wound bed including Adherent Slough. Wound #15 status is Open. Original cause of wound was Other Lesion. The date acquired was: 09/15/2021. The wound has  been in treatment 2 weeks. 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 The wound measures 0.5cm length x 1.3cm width x 0.2cm depth; 0.511cm^2 area and 0.102cm^3 volume. There is Fat Layer (Subcutaneous Tissue) exposed. There is a medium amount of serosanguineous drainage noted. The wound margin is distinct with the outline attached to the wound base. There is small (1-33%) granulation within the wound bed. There is a large (67-100%) amount of necrotic tissue within the wound bed including Adherent Slough. Wound #16 status is Open. Original cause of wound was Gradually Appeared. The date acquired was: 09/26/2021. The wound has been in treatment 2 weeks. The wound is located on the [place]. The wound measures 0.2cm length x 0.2cm width x 0.1cm depth; 0.031cm^2 area and 0.003cm^3 volume. There is Fat Layer (Subcutaneous Tissue) exposed. There is a medium amount of serosanguineous drainage noted. The wound margin is flat and intact. There is large (67-100%) pink granulation within the wound bed. There is no necrotic tissue within the wound bed. Wound #17 status is Open. Original cause of wound was Not Known. The date acquired was: 10/10/2021. The wound is located on the Right,Lateral Foot. The wound measures 0.4cm length x 0.5cm width x 0.1cm depth; 0.157cm^2 area and 0.016cm^3 volume. There is Fat Layer (Subcutaneous Tissue) exposed. There is a medium amount of serosanguineous drainage noted. There is medium (34-66%) red granulation within the wound bed. There is a medium (34-66%) amount of necrotic tissue within the wound bed including Adherent Slough. 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 - with Dr. Arcola Jansky Shower/ Hygiene: May shower with protection but do not get wound dressing(s) wet. Edema Control - Lymphedema / SCD / Other: Elevate legs to the level of the heart or above for 30 minutes daily and/or when sitting, a frequency of: - throughout the day Avoid standing for long periods of time. Home Health: New wound care orders this week; continue Home Health for wound care. May utilize formulary equivalent dressing for wound treatment orders unless otherwise specified. Dressing changes to be completed by [place] on Monday / Wednesday / Friday except when patient has scheduled visit at [facility]. Other Home Health Orders/Instructions: - [facility] Consults ordered were: Infectious Disease - Urgent******** Pt. 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 WOUND #14: - Calcaneus Wound Laterality: Left, Posterior Cleanser: Wound Cleanser (Home Health) 2 x Per Week/15 Days Discharge Instructions: Cleanse the wound with wound cleanser prior to applying a clean dressing using gauze sponges, not tissue or cotton balls. Prim Dressing: Promogran Prisma Matrix, 4.34 (sq in) (silver collagen) 2 x Per Week/15 Days ary Discharge Instructions: Moisten collagen with saline or hydrogel Secondary Dressing: Woven Gauze Sponge, Non-Sterile 4x4 in (Home Health) 2 x Per Week/15 Days Discharge Instructions: Apply over primary dressing as directed. Secondary Dressing: ABD Pad, 5x9 (Home Health) 2 x Per Week/15 Days Discharge Instructions: Apply over primary dressing as directed. Secured With: [employer], 4.5x3.1 (in/yd) (Home Health) 2 x Per Week/15 Days Discharge Instructions: Secure with Kerlix as directed. Secured With: Paper T ape, 2x10 (in/yd) (Home Health) 2 x Per Week/15 Days Discharge Instructions: Secure  dressing with tape as directed. WOUND #15: - Amputation Site - Transmetatarsal Wound Laterality: Right Cleanser: Soap and Water [facility]) Every Other Day/30 Days Discharge Instructions: May shower and wash wound with dial antibacterial soap and water prior to dressing change. Prim Dressing: Promogran Prisma Matrix, 4.34 (sq in) (silver collagen) (Home Health) Every Other Day/30 Days ary Discharge Instructions: Moisten collagen with [place] Jelly Secondary Dressing: Woven Gauze Sponges 2x2 in [facility]) Every Other Day/30 Days Discharge Instructions: Apply over primary dressing as directed. Secured With: [employer], 4.5x3.1 (in/yd) [facility]) Every Other Day/30 Days Discharge Instructions: Secure with Kerlix as directed. Secured With: [employer] T ape, 2x10 (in/yd) (Home Health) Every Other Day/30 Days Discharge Instructions: Secure dressing with tape as directed. Secured With: Netting Every Other Day/30 Days WOUND #16: - Foot Wound Laterality: Plantar, Right Cleanser: Soap and Water [facility]) Every Other Day/30 Days Discharge Instructions: May shower and wash wound with dial antibacterial soap and water prior to dressing change. Prim Dressing: Promogran Prisma Matrix, 4.34 (sq in) (silver collagen) (Home Health) Every Other Day/30 Days ary Discharge Instructions: Moisten collagen with [place] Jelly Secondary Dressing: Woven Gauze Sponges 2x2 in [facility]) Every Other Day/30 Days Discharge Instructions: Apply over primary dressing as directed. Secured With: [employer], 4.5x3.1 (in/yd) [facility] [facility] Every Other Day/30 Days Discharge Instructions: Secure with Kerlix as directed. Secured With: Transpore Surgical T ape, 2x10 (in/yd) (Home Health) Every Other Day/30 Days Discharge Instructions: Secure dressing with tape as directed. Secured With: Netting Every Other Day/30 Days 1. I am still using silver collagen on all areas. The left heel is just about  closed 2. The healed area which was the left transmit wound from August does not need a specific dressing which will  need to be protected 3. 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 Both wounds in this area probes to bone. Scrapings I did were negative I have continued to use silver collagen. 4. I will represcribe her Augmentin until we can get her reviewed by infectious disease 5. Sees Dr. Donzetta Matters this afternoon 6. 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 She states that Dr. Zadie Rhine told her that hyperbarics may have contributed to her visual loss. I found a note from him from June 2021 but nowhere do I see that he stated anything about hyperbaric oxygen. He stated she had severe diabetic retinopathy. I do not suspect she would agree to hyperbarics unless I talked to Dr. 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 10/10/2021 Medical Record Number: [id number] Patient Account Number: [id number] Date of Birth/Sex: Treating RN: 11-06-1940 (81 y.o. [name] Primary Care Provider: [name] Other Clinician: Referring Provider: Treating Provider/[name]: Tressie Stalker in Treatment: 16 Diagnosis Coding ICD-10 Codes Code Description [id number] Type 2 diabetes mellitus with foot ulcer T81.31XD Disruption of external operation (surgical) wound, not elsewhere classified, subsequent encounter L97.514 Non-pressure  chronic ulcer of other part of right foot with necrosis of bone L97.428 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 E11.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 Facility Procedures CPT4 Code: [id number] Description: 07622 - WOUND CARE VISIT-LEV 5 EST PT Modifier: Quantity: 1 Physician Procedures : CPT4 Code Description Modifier [id number] - WC PHYS LEVEL 4 - EST PT ICD-10 Diagnosis Description 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

## 2021-10-10 NOTE — H&P (View-Only) (Signed)
[facility] [place] HISTORY AND PHYSICAL   History of Present Illness:  Patient is a 81 y.o. year old female who presents for evaluation of Known PAD symptoms with multiple interventions on B LE.  S/P left common femoral - tibioperoneal trunk bypass with 35mm ringed ePTFE in anatomic tunnel and left third toe amputation by Dr. Stanford Breed 05/03/21.  The third toe amputation site failed to heal and she required left TMA.     TMA has fully healed and she is asymptomatic for claudication, non healing wounds or rest pain on the left LE.  She was most recently seen with left LE bypass stenosis and elevated ABI's.  She underwent angiogram with Angioplasty of distal bypass anastomosis and anterior tibial artery (3 mm [place]) by Dr. Carlis Abbott on 09/07/21.    She denies issues on the left LE.  S/P Right external iliac, common femoral, profundofemoral, and superficial femoral endarterectomy with bovine pericardial patch angioplasty by Dr. Trula Slade for right toe gangrene.  Then TMA right foot by Dr. Earleen Newport DPM.  She went on the require Right   common femoral to above-knee popliteal artery bypass with 6 mm ringed PTFE.  Followed by Dr. Donzetta Matters 08/16/20.       Today she states she has had right TMA wound development medial over the residual first MT and lateral over the 5th MT area. Dr. Dellia Nims ordered an MRI which showed Transmetatarsal amputation. 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 No drainable fluid collection to suggest an abscess.  She has been placed on Augmentin and silver collagen dressing changes.  She denies fever and chills.   She has continued Xarelto, ASA and Statin for maximum medical management.   Past Medical History:  Diagnosis Date   Anemia    after CABG in june 2021   Arthritis    Cancer ([place])    removal from nose - MOSE procedure    Complication of anesthesia    VERSED- agitated, muscle spasms, "jerking" , frantic , (never had this  occurence in the pas)    Coronary artery disease    Diabetes mellitus without complication ([place])    Dysrhythmia    PVC's   GERD (gastroesophageal reflux disease)    Heart murmur    History of hiatal hernia    Hyperlipidemia    Hypertension 11/20/11   ECHO- EF>55% Borderline concentric left ventricular hypertrophy. There is a small calcified mass in the L:A near the LA appendage. No valvular masses seen with associated mitral annular calcification. LA Volume/ BSA27.4 ml/m2 No AS. Right ventricular systolic pressure is elevated at 3mmHg.   Myocardial infarction [facility])    June 2021   Neuromuscular disorder ([place])    neuropathy in bilateral feet   Peripheral vascular disease ([place])    Pneumonia    not hosp.    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  PTCAs with drug coated balloon Lt ant tibial artery and Lt popliteal artery  11/26/2019    Past Surgical History:  Procedure Laterality Date   ABDOMINAL AORTAGRAM  11/25/2019   ABDOMINAL AORTOGRAM W/LOWER EXTREMITY    ABDOMINAL AORTOGRAM N/A 06/15/2019   Procedure: ABDOMINAL AORTOGRAM;  Surgeon: [name], MD;  Location: [place] [facility];  Service: Cardiovascular;  Laterality: N/A;   ABDOMINAL AORTOGRAM W/LOWER EXTREMITY N/A 11/25/2019   Procedure: ABDOMINAL AORTOGRAM W/LOWER EXTREMITY;  Surgeon: [name], MD;  Location: [place] [facility];  Service: Cardiovascular;  Laterality: N/A;  Lt leg   ABDOMINAL AORTOGRAM W/LOWER EXTREMITY N/A 08/01/2020   Procedure: ABDOMINAL AORTOGRAM W/LOWER EXTREMITY;  Surgeon: [name], MD;  Location: [place] [facility];  Service: Cardiovascular;  Laterality: N/A;   ABDOMINAL AORTOGRAM W/LOWER EXTREMITY N/A 04/28/2021   Procedure: ABDOMINAL AORTOGRAM W/LOWER EXTREMITY;  Surgeon: [name], MD;  Location: [place] [facility];  Service: Cardiovascular;  Laterality: N/A;   AMPUTATION TOE Left 05/03/2021   Procedure: AMPUTATION OF THIRD LEFT  TOE;  Surgeon: [name], MD;   Location: [facility] OR;  Service: Vascular;  Laterality: Left;   APPENDECTOMY     APPLICATION OF WOUND VAC Right 07/21/2019   Procedure: Application Of Prevena Wound Vac Right Groin;  Surgeon: [name], MD;  Location: [facility] OR;  Service: Vascular;  Laterality: Right;   CARDIAC CATHETERIZATION     CARPAL Romeo     x 2   COLONOSCOPY     CORONARY ARTERY BYPASS GRAFT N/A 03/24/2020   Procedure: CORONARY ARTERY BYPASS GRAFTING (CABG) using LIMA to LAD (m); RIMA to RAMUS; Endoscopic Right Greater Saphenous Vein: SVG to Diag1; SVG to PLB (right); and SVG to PL (left).;  Surgeon: [name], MD;  Location: [place];  Service: Open Heart Surgery;  Laterality: N/A;  BILATERAL IMA   ENDARTERECTOMY FEMORAL Right 07/21/2019   Procedure: RIGHT ENDARTERECTOMY FEMORAL WITH PATCH ANGIOPLASTY;  Surgeon: [name], MD;  Location: [place];  Service: Vascular;  Laterality: Right;   ENDARTERECTOMY FEMORAL Right 08/16/2020   Procedure: RIGHT FEMORAL ENDARTERECTOMY;  Surgeon: [name], MD;  Location: [place];  Service: Vascular;  Laterality: Right;   ENDOVEIN HARVEST OF GREATER SAPHENOUS VEIN Right 03/24/2020   Procedure: ENDOVEIN HARVEST OF GREATER SAPHENOUS VEIN;  Surgeon: [name], MD;  Location: [place];  Service: Open Heart Surgery;  Laterality: Right;   EYE SURGERY     cataract removal bilaterally   FEMORAL-POPLITEAL BYPASS GRAFT Right 08/16/2020   Procedure: BYPASS GRAFT FEMORAL-POPLITEAL ARTERY;  Surgeon: [name], MD;  Location: [place];  Service: Vascular;  Laterality: Right;   FEMORAL-POPLITEAL BYPASS GRAFT Left 05/03/2021   Procedure: LEFT FEMORAL TO BELOW KNEE POPLITEAL ARTERY BYPASS GRAFTING WITH 6MMX80 PTFE GRAFT;  Surgeon: [name], MD;  Location: [place];  Service: Vascular;  Laterality: Left;   INTRAVASCULAR LITHOTRIPSY  11/25/2019   Procedure: INTRAVASCULAR LITHOTRIPSY;  Surgeon: [name],  MD;  Location: [place] [facility];  Service: Cardiovascular;;  LT. SFA   LEFT HEART CATH AND CORONARY ANGIOGRAPHY N/A 03/22/2020   Procedure: LEFT HEART CATH AND CORONARY ANGIOGRAPHY;  Surgeon: Martinique, Peter M, MD;  Location: [place] [facility];  Service: Cardiovascular;  Laterality: N/A;   LOWER EXTREMITY ANGIOGRAPHY Bilateral 06/15/2019   Procedure: Lower Extremity Angiography;  Surgeon: [name], MD;  Location: [place] [facility];  Service: Cardiovascular;  Laterality: Bilateral;   LOWER EXTREMITY ANGIOGRAPHY Right 08/03/2019   Procedure: LOWER EXTREMITY ANGIOGRAPHY;  Surgeon: [name], MD;  Location: [place] [facility];  Service: Cardiovascular;  Laterality: Right;   LOWER EXTREMITY ANGIOGRAPHY N/A 09/07/2021   Procedure: LOWER EXTREMITY ANGIOGRAPHY;  Surgeon: Marty Heck, MD;  Location: [place] [facility];  Service: Cardiovascular;  Laterality: N/A;   PATCH ANGIOPLASTY Right 07/21/2019   Procedure: Patch Angioplasty Right Femoral Artery;  Surgeon: [name], MD;  Location: [place];  Service: Vascular;  Laterality: Right;   PERIPHERAL VASCULAR ATHERECTOMY  08/03/2019   Procedure: PERIPHERAL VASCULAR ATHERECTOMY;  Surgeon: [name], MD;  Location: [place] [facility];  Service: Cardiovascular;;  right SFA, right TP trunk   PERIPHERAL VASCULAR ATHERECTOMY  11/25/2019   Procedure: PERIPHERAL VASCULAR ATHERECTOMY;  Surgeon: [name], MD;  Location: [place] [facility];  Service: Cardiovascular;;  Lt.  POPLITEAL and AT   PERIPHERAL VASCULAR BALLOON ANGIOPLASTY Left 06/15/2019   Procedure: PERIPHERAL VASCULAR BALLOON ANGIOPLASTY;  Surgeon: [name], MD;  Location: [place] [facility];  Service: Cardiovascular;  Laterality: Left;  SFA UNSUCCESSFUL UNABLE TO CROSS LESION   PERIPHERAL VASCULAR BALLOON ANGIOPLASTY  08/03/2019   Procedure: PERIPHERAL VASCULAR BALLOON ANGIOPLASTY;  Surgeon: [name], MD;  Location: [place] [facility];  Service:  Cardiovascular;;  right SFA, Right TP trunk   PERIPHERAL VASCULAR BALLOON ANGIOPLASTY  09/07/2021   Procedure: PERIPHERAL VASCULAR BALLOON ANGIOPLASTY;  Surgeon: Marty Heck, MD;  Location: [place] [facility];  Service: Cardiovascular;;   TEE WITHOUT CARDIOVERSION N/A 03/24/2020   Procedure: TRANSESOPHAGEAL ECHOCARDIOGRAM (TEE);  Surgeon: [name], MD;  Location: [place];  Service: Open Heart Surgery;  Laterality: N/A;   TONSILLECTOMY     and adenoidectomy   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/2019   Procedure: TRANSMETATARSAL AMPUTATION;  Surgeon: Trula Slade, DPM;  Location: [place];  Service: Podiatry;  Laterality: Right;   TRANSMETATARSAL AMPUTATION Left 05/11/2021   Procedure: LEFT TRANSMETATARSAL AMPUTATION;  Surgeon: [name], MD;  Location: [place];  Service: Vascular;  Laterality: Left;   TUBAL LIGATION      ROS:   General:  No weight loss, Fever, chills  HEENT: No recent headaches, no nasal bleeding, no visual changes, no sore throat  Neurologic: No dizziness, blackouts, seizures. No recent symptoms of stroke or mini- stroke. No recent episodes of slurred speech, or temporary blindness.  Cardiac: No recent episodes of chest pain/pressure, no shortness of breath at rest.  No shortness of breath with exertion.  Denies history of atrial fibrillation or irregular heartbeat  Vascular: No history of rest pain in feet.  No history of claudication.  No history of non-healing ulcer, No history of DVT   Pulmonary: No home oxygen, no productive cough, no hemoptysis,  No asthma or wheezing  Musculoskeletal:  [ ]  Arthritis, [ ]  Low back pain,  [ ]  Joint pain  Hematologic:No history of hypercoagulable state.  No history of easy bleeding.  No history of anemia  Gastrointestinal: No hematochezia or melena,  No gastroesophageal reflux, no trouble swallowing  Urinary: [ ]  chronic Kidney disease, [ ]  on HD - [ ]  MWF or [ ]  TTHS, [ ]  Burning with urination,  [ ]  Frequent urination, [ ]  Difficulty urinating;   Skin: No rashes  Psychological: No history of anxiety,  No history of depression  Social History Social History   Tobacco Use   Smoking status: Former    Types: Cigarettes    Quit date: 03/31/1972    Years since quitting: 49.5   Smokeless tobacco: Never  Vaping Use   Vaping Use: Never used  Substance Use Topics   Alcohol use: No   Drug use: No    Family History Family History  Problem Relation Age of Onset   Cancer - Prostate Father    Cancer - Colon Father    Stroke Mother    Hypertension Mother    Hyperlipidemia Mother    Melanoma Brother  Allergies  Allergies  Allergen Reactions   Bactrim [Sulfamethoxazole-Trimethoprim] Other (See Comments)    ^ K+( elevated)    Demerol [Meperidine]     Delusional    Scopolamine     Delusional    Tramadol Other (See Comments)    Keeps awake   Versed [Midazolam] Anxiety    Frantic, out of my mind, agitated      Current Outpatient Medications  Medication Sig Dispense Refill   acetaminophen (TYLENOL) 500 MG tablet Take 1,000 mg by mouth every 6 (six) hours as needed for moderate pain or headache.     amoxicillin-clavulanate (AUGMENTIN) 875-125 MG tablet Take 1 tablet by mouth 2 (two) times daily.     aspirin EC 81 MG tablet Take 81 mg by mouth at bedtime.      atorvastatin (LIPITOR) 80 MG tablet Take 1 tablet (80 mg total) by mouth daily. Patient must keep upcoming appointment to receive future refills. 90 tablet 0   brimonidine (ALPHAGAN) 0.2 % ophthalmic solution Place 1 drop into the right eye 2 (two) times daily.     CINNAMON PO Take 2 tablets by mouth in the morning, at noon, and at bedtime.      empagliflozin (JARDIANCE) 25 MG TABS tablet Take 1 tablet (25 mg total) by mouth daily before breakfast. 30 tablet 2   FEROCON capsule Take 1 capsule by mouth 2 (two) times daily.     glimepiride (AMARYL) 2 MG tablet TAKE 1 TABLET BY MOUTH ONCE DAILY BEFORE BREAKFAST 30  tablet 0   glucose blood (ONETOUCH VERIO) test strip Use to check blood sugars once daily 100 each 5   hydrochlorothiazide (HYDRODIURIL) 12.5 MG tablet Take 1 tablet by mouth once daily 90 tablet 0   Lancets (ONETOUCH DELICA PLUS GLOVFI43P) MISC Use to check blood sugars twice daily. 100 each 5   metFORMIN (GLUCOPHAGE) 1000 MG tablet Take 1 tablet by mouth twice daily (Patient taking differently: Take 1,000 mg by mouth 2 (two) times daily with a meal.) 180 tablet 3   metoprolol tartrate (LOPRESSOR) 25 MG tablet TAKE 1 & 1/2 (ONE & ONE-HALF) TABLETS BY MOUTH TWICE DAILY 270 tablet 2   pantoprazole (PROTONIX) 40 MG tablet Take 1 tablet (40 mg total) by mouth daily. (Patient taking differently: Take 40 mg by mouth See admin instructions. Takes 40 mg 4 times a week M- W - F- Sunday) 90 tablet 1   Polyethyl Glycol-Propyl Glycol (SYSTANE OP) Place 1 drop into the left eye 2 (two) times daily as needed (Dry eyes).     Probiotic Product ([employer] SENIOR PROBIOTIC PO) Take 1 capsule by mouth daily with breakfast.     pyridoxine (B-6) 100 MG tablet Take 100 mg by mouth every evening.      repaglinide (PRANDIN) 1 MG tablet Take 1 tablet (1 mg total) by mouth daily before breakfast. 30 tablet 3   telmisartan (MICARDIS) 80 MG tablet Take 1 tablet by mouth once daily (Patient taking differently: Take 80 mg by mouth daily.) 90 tablet 3   XARELTO 2.5 MG TABS tablet Take 1 tablet by mouth twice daily 60 tablet 0   Current Facility-Administered Medications  Medication Dose Route Frequency Provider Last Rate Last Admin   sodium chloride flush (NS) 0.9 % injection 3 mL  3 mL Intravenous Q12H [name], MD        Physical Examination  Vitals:   10/10/21 1358  BP: (!) 146/55  Pulse: 79  Resp: 18  Temp: 98.4 F (  36.9 C)  TempSrc: Temporal  SpO2: 98%  Weight: 142 lb (64.4 kg)  Height: 5' 4.5" (1.638 m)    Body mass index is 24 kg/m.  General:  Alert and oriented, no acute distress HEENT:  Normal Neck: No bruit or JVD Pulmonary: Clear to auscultation bilaterally Cardiac: Regular Rate and Rhythm without murmur Abdomen: Soft, non-tender, non-distended, no mass, no scars Skin: No rash Left TMA well healed.  Right TMA with erythema and opening in the TMA both medial and laterally.  No drainage or malodor.   Musculoskeletal: No deformity or edema  Neurologic: Upper and lower extremity motor grossly intact and symmetric  DATA:     ABI Findings:  +--------+------------------+-----+--------+------------------+   Right    Rt Pressure (mmHg) Index Waveform Comment              +--------+------------------+-----+--------+------------------+   Brachial 148                                                    +--------+------------------+-----+--------+------------------+   PTA                               absent   unable to insonate   +--------+------------------+-----+--------+------------------+   DP                                absent   unable to insonate   +--------+------------------+-----+--------+------------------+   +--------+------------------+-----+--------+-------+   Left     Lt Pressure (mmHg) Index Waveform Comment   +--------+------------------+-----+--------+-------+   Brachial 153                                         +--------+------------------+-----+--------+-------+   PTA      100                0.65                     +--------+------------------+-----+--------+-------+   DP       132                0.86                     +--------+------------------+-----+--------+-------+   +-------+-----------+-----------+------------+------------+   ABI/TBI Today's ABI Today's TBI Previous ABI Previous TBI   +-------+-----------+-----------+------------+------------+   Right   0           amputated   0            amputated      +-------+-----------+-----------+------------+------------+   Left    0.86        amputated   0.71         amputated       +-------+-----------+-----------+------------+------------+      +-----------+--------+-----+--------+----------+--------+   LEFT        PSV cm/s Ratio Stenosis Waveform   Comments   +-----------+--------+-----+--------+----------+--------+   CFA Distal  159                     triphasic             +-----------+--------+-----+--------+----------+--------+  DFA         185                     biphasic              +-----------+--------+-----+--------+----------+--------+   ATA Prox    169                     monophasic            +-----------+--------+-----+--------+----------+--------+   ATA Mid     135                     monophasic            +-----------+--------+-----+--------+----------+--------+   ATA Distal  116                     monophasic            +-----------+--------+-----+--------+----------+--------+   PTA Distal  22                      monophasic            +-----------+--------+-----+--------+----------+--------+   PERO Distal 43                      monophasic            +-----------+--------+-----+--------+----------+--------+      Left Graft #1: Femoral-popliteal  +--------------------+--------+-------------+----------+--------+                        PSV cm/s Stenosis      Waveform   Comments   +--------------------+--------+-------------+----------+--------+   Inflow               167                    biphasic              +--------------------+--------+-------------+----------+--------+   Proximal Anastomosis 349      >70% stenosis monophasic            +--------------------+--------+-------------+----------+--------+   Proximal Graft       90                     monophasic            +--------------------+--------+-------------+----------+--------+   Mid Graft            70                     monophasic            +--------------------+--------+-------------+----------+--------+   Distal Graft         71                     monophasic             +--------------------+--------+-------------+----------+--------+   Distal Anastomosis   88                     monophasic            +--------------------+--------+-------------+----------+--------+   Outflow              85                     monophasic            +--------------------+--------+-------------+----------+--------+  Summary:  Left: Patent left femoral-popliteal bypass graft with elevated velocities  in the proximal anastomosis suggesting >70% stenosis.   ASSESSMENT/PLAN:  Improved left LE bypass flow with PSV of 88 cm/s distally.  Residual inflow stenosis PSV 348 cm/s > 70 % stenosis.  Left ABI index now 0.86.  The right LE ABI has been absent since 09/05/21 without new symptoms until today.  She will continue dressing changes and PO antibiotics.  I discussed the case with DR. And he saw the patient in conjunction with me today.  We have scheduled her for angiogram with right LE runoff and possible intervention.    She states that Dr. Dellia Nims will contact Infectious Disease  to help form an antibiotic plan of care.        Roxy Horseman PA-C [facility] [place] Office: [phone]  MD in clinic [place]

## 2021-10-10 NOTE — Progress Notes (Signed)
[facility] [place] HISTORY AND PHYSICAL   History of Present Illness:  Patient is a 81 y.o. year old female who presents for evaluation of Known PAD symptoms with multiple interventions on B LE.  S/P left common femoral - tibioperoneal trunk bypass with 67mm ringed ePTFE in anatomic tunnel and left third toe amputation by Dr. Stanford Breed 05/03/21.  The third toe amputation site failed to heal and she required left TMA.     TMA has fully healed and she is asymptomatic for claudication, non healing wounds or rest pain on the left LE.  She was most recently seen with left LE bypass stenosis and elevated ABI's.  She underwent angiogram with Angioplasty of distal bypass anastomosis and anterior tibial artery (3 mm [place]) by Dr. Carlis Abbott on 09/07/21.    She denies issues on the left LE.  S/P Right external iliac, common femoral, profundofemoral, and superficial femoral endarterectomy with bovine pericardial patch angioplasty by Dr. Trula Slade for right toe gangrene.  Then TMA right foot by Dr. Earleen Newport DPM.  She went on the require Right   common femoral to above-knee popliteal artery bypass with 6 mm ringed PTFE.  Followed by Dr. Donzetta Matters 08/16/20.       Today she states she has had right TMA wound development medial over the residual first MT and lateral over the 5th MT area. Dr. Dellia Nims ordered an MRI which showed Transmetatarsal amputation. 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 No drainable fluid collection to suggest an abscess.  She has been placed on Augmentin and silver collagen dressing changes.  She denies fever and chills.   She has continued Xarelto, ASA and Statin for maximum medical management.   Past Medical History:  Diagnosis Date   Anemia    after CABG in june 2021   Arthritis    Cancer ([place])    removal from nose - MOSE procedure    Complication of anesthesia    VERSED- agitated, muscle spasms, "jerking" , frantic , (never had this  occurence in the pas)    Coronary artery disease    Diabetes mellitus without complication ([place])    Dysrhythmia    PVC's   GERD (gastroesophageal reflux disease)    Heart murmur    History of hiatal hernia    Hyperlipidemia    Hypertension 11/20/11   ECHO- EF>55% Borderline concentric left ventricular hypertrophy. There is a small calcified mass in the L:A near the LA appendage. No valvular masses seen with associated mitral annular calcification. LA Volume/ BSA27.4 ml/m2 No AS. Right ventricular systolic pressure is elevated at 23mmHg.   Myocardial infarction [facility])    June 2021   Neuromuscular disorder ([place])    neuropathy in bilateral feet   Peripheral vascular disease ([place])    Pneumonia    not hosp.    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  PTCAs with drug coated balloon Lt ant tibial artery and Lt popliteal artery  11/26/2019    Past Surgical History:  Procedure Laterality Date   ABDOMINAL AORTAGRAM  11/25/2019   ABDOMINAL AORTOGRAM W/LOWER EXTREMITY    ABDOMINAL AORTOGRAM N/A 06/15/2019   Procedure: ABDOMINAL AORTOGRAM;  Surgeon: [name], MD;  Location: [place] [facility];  Service: Cardiovascular;  Laterality: N/A;   ABDOMINAL AORTOGRAM W/LOWER EXTREMITY N/A 11/25/2019   Procedure: ABDOMINAL AORTOGRAM W/LOWER EXTREMITY;  Surgeon: [name], MD;  Location: [place] [facility];  Service: Cardiovascular;  Laterality: N/A;  Lt leg   ABDOMINAL AORTOGRAM W/LOWER EXTREMITY N/A 08/01/2020   Procedure: ABDOMINAL AORTOGRAM W/LOWER EXTREMITY;  Surgeon: [name], MD;  Location: [place] [facility];  Service: Cardiovascular;  Laterality: N/A;   ABDOMINAL AORTOGRAM W/LOWER EXTREMITY N/A 04/28/2021   Procedure: ABDOMINAL AORTOGRAM W/LOWER EXTREMITY;  Surgeon: [name], MD;  Location: [place] [facility];  Service: Cardiovascular;  Laterality: N/A;   AMPUTATION TOE Left 05/03/2021   Procedure: AMPUTATION OF THIRD LEFT  TOE;  Surgeon: [name], MD;   Location[facility];  Service: Vascular;  Laterality: Left;   APPENDECTOMY     APPLICATION OF WOUND VAC Right 07/21/2019   Procedure: Application Of Prevena Wound Vac Right Groin;  Surgeon: [name], MD;  Location: [facility] OR;  Service: Vascular;  Laterality: Right;   CARDIAC CATHETERIZATION     CARPAL [place]     x 2   COLONOSCOPY     CORONARY ARTERY BYPASS GRAFT N/A 03/24/2020   Procedure: CORONARY ARTERY BYPASS GRAFTING (CABG) using LIMA to LAD (m); RIMA to RAMUS; Endoscopic Right Greater Saphenous Vein: SVG to Diag1; SVG to PLB (right); and SVG to PL (left).;  Surgeon: [name], MD;  Location: [place];  Service: Open Heart Surgery;  Laterality: N/A;  BILATERAL IMA   ENDARTERECTOMY FEMORAL Right 07/21/2019   Procedure: RIGHT ENDARTERECTOMY FEMORAL WITH PATCH ANGIOPLASTY;  Surgeon: [name], MD;  Location: [place];  Service: Vascular;  Laterality: Right;   ENDARTERECTOMY FEMORAL Right 08/16/2020   Procedure: RIGHT FEMORAL ENDARTERECTOMY;  Surgeon: [name], MD;  Location: [place];  Service: Vascular;  Laterality: Right;   ENDOVEIN HARVEST OF GREATER SAPHENOUS VEIN Right 03/24/2020   Procedure: ENDOVEIN HARVEST OF GREATER SAPHENOUS VEIN;  Surgeon: [name], MD;  Location: [place];  Service: Open Heart Surgery;  Laterality: Right;   EYE SURGERY     cataract removal bilaterally   FEMORAL-POPLITEAL BYPASS GRAFT Right 08/16/2020   Procedure: BYPASS GRAFT FEMORAL-POPLITEAL ARTERY;  Surgeon: [name], MD;  Location: [place];  Service: Vascular;  Laterality: Right;   FEMORAL-POPLITEAL BYPASS GRAFT Left 05/03/2021   Procedure: LEFT FEMORAL TO BELOW KNEE POPLITEAL ARTERY BYPASS GRAFTING WITH 6MMX80 PTFE GRAFT;  Surgeon: [name], MD;  Location: [place];  Service: Vascular;  Laterality: Left;   INTRAVASCULAR LITHOTRIPSY  11/25/2019   Procedure: INTRAVASCULAR LITHOTRIPSY;  Surgeon: [name],  MD;  Location: [place] [facility];  Service: Cardiovascular;;  LT. SFA   LEFT HEART CATH AND CORONARY ANGIOGRAPHY N/A 03/22/2020   Procedure: LEFT HEART CATH AND CORONARY ANGIOGRAPHY;  Surgeon: Martinique, Peter M, MD;  Location: [place] [facility];  Service: Cardiovascular;  Laterality: N/A;   LOWER EXTREMITY ANGIOGRAPHY Bilateral 06/15/2019   Procedure: Lower Extremity Angiography;  Surgeon: [name], MD;  Location: [place] [facility];  Service: Cardiovascular;  Laterality: Bilateral;   LOWER EXTREMITY ANGIOGRAPHY Right 08/03/2019   Procedure: LOWER EXTREMITY ANGIOGRAPHY;  Surgeon: [name], MD;  Location: [place] [facility];  Service: Cardiovascular;  Laterality: Right;   LOWER EXTREMITY ANGIOGRAPHY N/A 09/07/2021   Procedure: LOWER EXTREMITY ANGIOGRAPHY;  Surgeon: Marty Heck, MD;  Location: [place] [facility];  Service: Cardiovascular;  Laterality: N/A;   PATCH ANGIOPLASTY Right 07/21/2019   Procedure: Patch Angioplasty Right Femoral Artery;  Surgeon: [name], MD;  Location: [place];  Service: Vascular;  Laterality: Right;   PERIPHERAL VASCULAR ATHERECTOMY  08/03/2019   Procedure: PERIPHERAL VASCULAR ATHERECTOMY;  Surgeon: [name], MD;  Location: [place] [facility];  Service: Cardiovascular;;  right SFA, right TP trunk   PERIPHERAL VASCULAR ATHERECTOMY  11/25/2019   Procedure: PERIPHERAL VASCULAR ATHERECTOMY;  Surgeon: [name], MD;  Location: [place] [facility];  Service: Cardiovascular;;  Lt.  POPLITEAL and AT   PERIPHERAL VASCULAR BALLOON ANGIOPLASTY Left 06/15/2019   Procedure: PERIPHERAL VASCULAR BALLOON ANGIOPLASTY;  Surgeon: [name], MD;  Location: [place] [facility];  Service: Cardiovascular;  Laterality: Left;  SFA UNSUCCESSFUL UNABLE TO CROSS LESION   PERIPHERAL VASCULAR BALLOON ANGIOPLASTY  08/03/2019   Procedure: PERIPHERAL VASCULAR BALLOON ANGIOPLASTY;  Surgeon: [name], MD;  Location: [place] [facility];  Service:  Cardiovascular;;  right SFA, Right TP trunk   PERIPHERAL VASCULAR BALLOON ANGIOPLASTY  09/07/2021   Procedure: PERIPHERAL VASCULAR BALLOON ANGIOPLASTY;  Surgeon: Marty Heck, MD;  Location: [place] [facility];  Service: Cardiovascular;;   TEE WITHOUT CARDIOVERSION N/A 03/24/2020   Procedure: TRANSESOPHAGEAL ECHOCARDIOGRAM (TEE);  Surgeon: [name], MD;  Location: [place];  Service: Open Heart Surgery;  Laterality: N/A;   TONSILLECTOMY     and adenoidectomy   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/2019   Procedure: TRANSMETATARSAL AMPUTATION;  Surgeon: Trula Slade, DPM;  Location: [place];  Service: Podiatry;  Laterality: Right;   TRANSMETATARSAL AMPUTATION Left 05/11/2021   Procedure: LEFT TRANSMETATARSAL AMPUTATION;  Surgeon: [name], MD;  Location: [place];  Service: Vascular;  Laterality: Left;   TUBAL LIGATION      ROS:   General:  No weight loss, Fever, chills  HEENT: No recent headaches, no nasal bleeding, no visual changes, no sore throat  Neurologic: No dizziness, blackouts, seizures. No recent symptoms of stroke or mini- stroke. No recent episodes of slurred speech, or temporary blindness.  Cardiac: No recent episodes of chest pain/pressure, no shortness of breath at rest.  No shortness of breath with exertion.  Denies history of atrial fibrillation or irregular heartbeat  Vascular: No history of rest pain in feet.  No history of claudication.  No history of non-healing ulcer, No history of DVT   Pulmonary: No home oxygen, no productive cough, no hemoptysis,  No asthma or wheezing  Musculoskeletal:  [ ]  Arthritis, [ ]  Low back pain,  [ ]  Joint pain  Hematologic:No history of hypercoagulable state.  No history of easy bleeding.  No history of anemia  Gastrointestinal: No hematochezia or melena,  No gastroesophageal reflux, no trouble swallowing  Urinary: [ ]  chronic Kidney disease, [ ]  on HD - [ ]  MWF or [ ]  TTHS, [ ]  Burning with urination,  [ ]  Frequent urination, [ ]  Difficulty urinating;   Skin: No rashes  Psychological: No history of anxiety,  No history of depression  Social History Social History   Tobacco Use   Smoking status: Former    Types: Cigarettes    Quit date: 03/31/1972    Years since quitting: 49.5   Smokeless tobacco: Never  Vaping Use   Vaping Use: Never used  Substance Use Topics   Alcohol use: No   Drug use: No    Family History Family History  Problem Relation Age of Onset   Cancer - Prostate Father    Cancer - Colon Father    Stroke Mother    Hypertension Mother    Hyperlipidemia Mother    Melanoma Brother  Allergies  Allergies  Allergen Reactions   Bactrim [Sulfamethoxazole-Trimethoprim] Other (See Comments)    ^ K+( elevated)    Demerol [Meperidine]     Delusional    Scopolamine     Delusional    Tramadol Other (See Comments)    Keeps awake   Versed [Midazolam] Anxiety    Frantic, out of my mind, agitated      Current Outpatient Medications  Medication Sig Dispense Refill   acetaminophen (TYLENOL) 500 MG tablet Take 1,000 mg by mouth every 6 (six) hours as needed for moderate pain or headache.     amoxicillin-clavulanate (AUGMENTIN) 875-125 MG tablet Take 1 tablet by mouth 2 (two) times daily.     aspirin EC 81 MG tablet Take 81 mg by mouth at bedtime.      atorvastatin (LIPITOR) 80 MG tablet Take 1 tablet (80 mg total) by mouth daily. Patient must keep upcoming appointment to receive future refills. 90 tablet 0   brimonidine (ALPHAGAN) 0.2 % ophthalmic solution Place 1 drop into the right eye 2 (two) times daily.     CINNAMON PO Take 2 tablets by mouth in the morning, at noon, and at bedtime.      empagliflozin (JARDIANCE) 25 MG TABS tablet Take 1 tablet (25 mg total) by mouth daily before breakfast. 30 tablet 2   FEROCON capsule Take 1 capsule by mouth 2 (two) times daily.     glimepiride (AMARYL) 2 MG tablet TAKE 1 TABLET BY MOUTH ONCE DAILY BEFORE BREAKFAST 30  tablet 0   glucose blood (ONETOUCH VERIO) test strip Use to check blood sugars once daily 100 each 5   hydrochlorothiazide (HYDRODIURIL) 12.5 MG tablet Take 1 tablet by mouth once daily 90 tablet 0   Lancets (ONETOUCH DELICA PLUS [id number]) MISC Use to check blood sugars twice daily. 100 each 5   metFORMIN (GLUCOPHAGE) 1000 MG tablet Take 1 tablet by mouth twice daily (Patient taking differently: Take 1,000 mg by mouth 2 (two) times daily with a meal.) 180 tablet 3   metoprolol tartrate (LOPRESSOR) 25 MG tablet TAKE 1 & 1/2 (ONE & ONE-HALF) TABLETS BY MOUTH TWICE DAILY 270 tablet 2   pantoprazole (PROTONIX) 40 MG tablet Take 1 tablet (40 mg total) by mouth daily. (Patient taking differently: Take 40 mg by mouth See admin instructions. Takes 40 mg 4 times a week M- W - F- Sunday) 90 tablet 1   Polyethyl Glycol-Propyl Glycol (SYSTANE OP) Place 1 drop into the left eye 2 (two) times daily as needed (Dry eyes).     Probiotic Product ([employer] SENIOR PROBIOTIC PO) Take 1 capsule by mouth daily with breakfast.     pyridoxine (B-6) 100 MG tablet Take 100 mg by mouth every evening.      repaglinide (PRANDIN) 1 MG tablet Take 1 tablet (1 mg total) by mouth daily before breakfast. 30 tablet 3   telmisartan (MICARDIS) 80 MG tablet Take 1 tablet by mouth once daily (Patient taking differently: Take 80 mg by mouth daily.) 90 tablet 3   XARELTO 2.5 MG TABS tablet Take 1 tablet by mouth twice daily 60 tablet 0   Current Facility-Administered Medications  Medication Dose Route Frequency Provider Last Rate Last Admin   sodium chloride flush (NS) 0.9 % injection 3 mL  3 mL Intravenous Q12H [name], MD        Physical Examination  Vitals:   10/10/21 1358  BP: (!) 146/55  Pulse: 79  Resp: 18  Temp: 98.4 F (  36.9 C)  TempSrc: Temporal  SpO2: 98%  Weight: 142 lb (64.4 kg)  Height: 5' 4.5" (1.638 m)    Body mass index is 24 kg/m.  General:  Alert and oriented, no acute distress HEENT:  Normal Neck: No bruit or JVD Pulmonary: Clear to auscultation bilaterally Cardiac: Regular Rate and Rhythm without murmur Abdomen: Soft, non-tender, non-distended, no mass, no scars Skin: No rash Left TMA well healed.  Right TMA with erythema and opening in the TMA both medial and laterally.  No drainage or malodor.   Musculoskeletal: No deformity or edema  Neurologic: Upper and lower extremity motor grossly intact and symmetric  DATA:     ABI Findings:  +--------+------------------+-----+--------+------------------+   Right    Rt Pressure (mmHg) Index Waveform Comment              +--------+------------------+-----+--------+------------------+   Brachial 148                                                    +--------+------------------+-----+--------+------------------+   PTA                               absent   unable to insonate   +--------+------------------+-----+--------+------------------+   DP                                absent   unable to insonate   +--------+------------------+-----+--------+------------------+   +--------+------------------+-----+--------+-------+   Left     Lt Pressure (mmHg) Index Waveform Comment   +--------+------------------+-----+--------+-------+   Brachial 153                                         +--------+------------------+-----+--------+-------+   PTA      100                0.65                     +--------+------------------+-----+--------+-------+   DP       132                0.86                     +--------+------------------+-----+--------+-------+   +-------+-----------+-----------+------------+------------+   ABI/TBI Today's ABI Today's TBI Previous ABI Previous TBI   +-------+-----------+-----------+------------+------------+   Right   0           amputated   0            amputated      +-------+-----------+-----------+------------+------------+   Left    0.86        amputated   0.71         amputated       +-------+-----------+-----------+------------+------------+      +-----------+--------+-----+--------+----------+--------+   LEFT        PSV cm/s Ratio Stenosis Waveform   Comments   +-----------+--------+-----+--------+----------+--------+   CFA Distal  159                     triphasic             +-----------+--------+-----+--------+----------+--------+  DFA         185                     biphasic              +-----------+--------+-----+--------+----------+--------+   ATA Prox    169                     monophasic            +-----------+--------+-----+--------+----------+--------+   ATA Mid     135                     monophasic            +-----------+--------+-----+--------+----------+--------+   ATA Distal  116                     monophasic            +-----------+--------+-----+--------+----------+--------+   PTA Distal  22                      monophasic            +-----------+--------+-----+--------+----------+--------+   PERO Distal 43                      monophasic            +-----------+--------+-----+--------+----------+--------+      Left Graft #1: Femoral-popliteal  +--------------------+--------+-------------+----------+--------+                        PSV cm/s Stenosis      Waveform   Comments   +--------------------+--------+-------------+----------+--------+   Inflow               167                    biphasic              +--------------------+--------+-------------+----------+--------+   Proximal Anastomosis 349      >70% stenosis monophasic            +--------------------+--------+-------------+----------+--------+   Proximal Graft       90                     monophasic            +--------------------+--------+-------------+----------+--------+   Mid Graft            70                     monophasic            +--------------------+--------+-------------+----------+--------+   Distal Graft         71                     monophasic             +--------------------+--------+-------------+----------+--------+   Distal Anastomosis   88                     monophasic            +--------------------+--------+-------------+----------+--------+   Outflow              85                     monophasic            +--------------------+--------+-------------+----------+--------+  Summary:  Left: Patent left femoral-popliteal bypass graft with elevated velocities  in the proximal anastomosis suggesting >70% stenosis.   ASSESSMENT/PLAN:  Improved left LE bypass flow with PSV of 88 cm/s distally.  Residual inflow stenosis PSV 348 cm/s > 70 % stenosis.  Left ABI index now 0.86.  The right LE ABI has been absent since 09/05/21 without new symptoms until today.  She will continue dressing changes and PO antibiotics.  I discussed the case with DR. And he saw the patient in conjunction with me today.  We have scheduled her for angiogram with right LE runoff and possible intervention.    She states that Dr. Dellia Nims will contact Infectious Disease  to help form an antibiotic plan of care.        Roxy Horseman PA-C [facility] Office: [phone]  MD in clinic [place]

## 2021-10-11 NOTE — Progress Notes (Signed)
LIZZA, HUFFAKER ([id number]) Visit Report for 10/10/2021 Arrival Information Details Patient Name: Date of Service: [name] 10/10/2021 10:30 A M Medical Record Number: [id number] Patient Account Number: [id number] Date of Birth/Sex: Treating RN: [birth year] (81 y.o. [name] Primary Care Pranika Finks: [name] Other Clinician: Referring Jahan Friedlander: Treating Maddax Palinkas/[name]: Tressie Stalker in Treatment: 13 Visit Information History Since Last Visit Added or deleted any medications: No Patient Arrived: [name] Any new allergies or adverse reactions: No Arrival Time: 10:42 Had a fall or experienced change in No Accompanied By: self activities of daily living that may affect Transfer Assistance: None risk of falls: Patient Identification Verified: Yes Signs or symptoms of abuse/neglect since last visito No Patient Requires Transmission-Based Precautions: No Hospitalized since last visit: No Patient Has Alerts: Yes Implantable device outside of the clinic excluding No Patient Alerts: Patient on Blood Thinner cellular tissue based products placed in the center L ABI: 0.82 since last visit: R ABI: 0.47 Has Dressing in Place as Prescribed: Yes Pain Present Now: No Electronic Signature(s) Signed: 10/10/2021 5:55:40 PM By: Dellie Catholic RN Entered By: Dellie Catholic on 10/10/2021 10:43:14 -------------------------------------------------------------------------------- Clinic Level of Care Assessment Details Patient Name: Date of Service: [name] 10/10/2021 10:30 A M Medical Record Number: [id number] Patient Account Number: [id number] Date of Birth/Sex: Treating RN: 12/27/1940 (81 y.o. [name] Primary Care Kirubel Aja: [name] Other Clinician: Referring Olsen Mccutchan: Treating Arrie Borrelli/[name]: Tressie Stalker in Treatment: 16 Clinic Level of Care Assessment Items TOOL 4 Quantity  Score X- 1 0 Use when only an EandM is performed on FOLLOW-UP visit ASSESSMENTS - Nursing Assessment / Reassessment X- 1 10 Reassessment of Co-morbidities (includes updates in patient status) X- 1 5 Reassessment of Adherence to Treatment Plan ASSESSMENTS - Wound and Skin A ssessment / Reassessment []  - 0 Simple Wound Assessment / Reassessment - one wound X- 4 5 Complex Wound Assessment / Reassessment - multiple wounds []  - 0 Dermatologic / Skin Assessment (not related to wound area) ASSESSMENTS - Focused Assessment X- 2 5 Circumferential Edema Measurements - multi extremities []  - 0 Nutritional Assessment / Counseling / Intervention []  - 0 Lower Extremity Assessment (monofilament, tuning fork, pulses) []  - 0 Peripheral Arterial Disease Assessment (using hand held doppler) ASSESSMENTS - Ostomy and/or Continence Assessment and Care []  - 0 Incontinence Assessment and Management []  - 0 Ostomy Care Assessment and Management (repouching, etc.) PROCESS - Coordination of Care []  - 0 Simple Patient / Family Education for ongoing care X- 1 20 Complex (extensive) Patient / Family Education for ongoing care X- 1 10 Staff obtains Programmer, systems, Records, T Results / Process Orders est []  - 0 Staff telephones HHA, Nursing Homes / Clarify orders / etc []  - 0 Routine Transfer to another Facility (non-emergent condition) []  - 0 Routine Hospital Admission (non-emergent condition) []  - 0 New Admissions / Biomedical engineer / Ordering NPWT Apligraf, etc. , []  - 0 Emergency Hospital Admission (emergent condition) []  - 0 Simple Discharge Coordination X- 1 15 Complex (extensive) Discharge Coordination PROCESS - Special Needs []  - 0 Pediatric / Minor Patient Management []  - 0 Isolation Patient Management []  - 0 Hearing / Language / Visual special needs []  - 0 Assessment of Community assistance (transportation, D/C planning, etc.) []  - 0 Additional assistance / Altered  mentation []  - 0 Support Surface(s) Assessment (bed, cushion, seat, etc.) INTERVENTIONS - Wound Cleansing / Measurement []  - 0 Simple Wound Cleansing - one wound X- 4  5 Complex Wound Cleansing - multiple wounds X- 1 5 Wound Imaging (photographs - any number of wounds) []  - 0 Wound Tracing (instead of photographs) []  - 0 Simple Wound Measurement - one wound X- 4 5 Complex Wound Measurement - multiple wounds INTERVENTIONS - Wound Dressings []  - 0 Small Wound Dressing one or multiple wounds X- 4 15 Medium Wound Dressing one or multiple wounds []  - 0 Large Wound Dressing one or multiple wounds []  - 0 Application of Medications - topical []  - 0 Application of Medications - injection INTERVENTIONS - Miscellaneous []  - 0 External ear exam []  - 0 Specimen Collection (cultures, biopsies, blood, body fluids, etc.) []  - 0 Specimen(s) / Culture(s) sent or taken to Lab for analysis []  - 0 Patient Transfer (multiple staff / Civil Service fast streamer / Similar devices) []  - 0 Simple Staple / Suture removal (25 or less) []  - 0 Complex Staple / Suture removal (26 or more) []  - 0 Hypo / Hyperglycemic Management (close monitor of Blood Glucose) []  - 0 Ankle / Brachial Index (ABI) - do not check if billed separately X- 1 5 Vital Signs Has the patient been seen at the hospital within the last three years: Yes Total Score: 200 Level Of Care: New/Established - Level 5 Electronic Signature(s) Signed: 10/10/2021 5:55:40 PM By: Dellie Catholic RN Entered By: Dellie Catholic on 10/10/2021 17:27:37 -------------------------------------------------------------------------------- Encounter Discharge Information Details Patient Name: Date of Service: [name], [name] Calamity LLY H. 10/10/2021 10:30 A M Medical Record Number: [id number] Patient Account Number: [id number] Date of Birth/Sex: Treating RN: July 15, 1941 (81 y.o. [name], [name] Primary Care Tyshae Stair: [name] Other Clinician: Referring  Mersadie Kavanaugh: Treating Billey Wojciak/[name]: Tressie Stalker in Treatment: 16 Encounter Discharge Information Items Discharge Condition: Stable Ambulatory Status: Ambulatory Discharge Destination: Home Transportation: Private Auto Accompanied By: self Schedule Follow-up Appointment: Yes Clinical Summary of Care: Patient Declined Electronic Signature(s) Signed: 10/11/2021 4:57:53 PM By: Rhae Hammock RN Entered By: Rhae Hammock on 10/10/2021 17:30:30 -------------------------------------------------------------------------------- Lower Extremity Assessment Details Patient Name: Date of Service: [name], [name] 10/10/2021 10:30 A M Medical Record Number: [id number] Patient Account Number: [id number] Date of Birth/Sex: Treating RN: August 28, 1941 (80 y.o. [name] Primary Care Chidi Shirer: [name] Other Clinician: Referring Elleah Hemsley: Treating Perlita Forbush/[name]: Tressie Stalker in Treatment: 16 Edema Assessment Assessed: Shirlyn Goltz: No] Patrice Paradise: No] Edema: [Left: No] [Right: Yes] Calf Left: Right: Point of Measurement: From Medial Instep 33 cm 33 cm Ankle Left: Right: Point of Measurement: From Medial Instep 20 cm 20 cm Electronic Signature(s) Signed: 10/10/2021 5:55:40 PM By: Dellie Catholic RN Entered By: Dellie Catholic on 10/10/2021 11:00:19 -------------------------------------------------------------------------------- Multi Wound Chart Details Patient Name: Date of Service: [name], [name]. 10/10/2021 10:30 A M Medical Record Number: [id number] Patient Account Number: [id number] Date of Birth/Sex: Treating RN: [birth year] (81 y.o. [name] Primary Care Joselyn Edling: [name] Other Clinician: Referring Daveion Robar: Treating [name] [name]/[name]: Tressie Stalker in Treatment: 16 Vital Signs Height(in): 46 Pulse(bpm): 71 Weight(lbs): 135 Blood Pressure(mmHg):  162/68 Body Mass Index(BMI): 23 Temperature(F): 97.6 Respiratory Rate(breaths/min): 18 Photos: [13:No Photos Left Amputation Site - Transmetatarsal Left, Posterior Calcaneus] [14:No Photos] [15:No Photos Right Amputation Site -] Wound Location: [13:Surgical Injury] [14:Pressure Injury] [15:Transmetatarsal Other Lesion] Wounding Event: [13:Diabetic Wound/Ulcer of the Lower] [14:Diabetic Wound/Ulcer of the Lower] [15:Diabetic Wound/Ulcer of the Lower] Primary Etiology: [13:Extremity Open Surgical Wound] [14:Extremity N/A] [15:Extremity N/A] Secondary Etiology: [13:Coronary Artery Disease, Deep Vein Coronary Artery Disease, Deep Vein Coronary Artery Disease, Deep  Vein] Comorbid History: [13:Thrombosis, Hypertension, Myocardial Thrombosis, Hypertension, Myocardial Thrombosis, Hypertension, Myocardial Infarction, Peripheral Arterial Disease, Infarction, Peripheral Arterial Disease, Infarction, Peripheral Arterial  Disease, Type II Diabetes 05/11/2021] [14:Type II Diabetes 05/11/2021] [15:Type II Diabetes 09/15/2021] Date Acquired: [13:16] [14:16] [15:2] Weeks of Treatment: [13:Healed - Epithelialized] [14:Open] [15:Open] Wound Status: [13:0x0x0] [14:0.1x0.1x0.1] [15:0.5x1.3x0.2] Measurements L x W x D (cm) [13:0] [14:0.008] [15:0.511] A (cm) : rea [13:0] [14:0.001] [15:0.102] Volume (cm) : [13:100.00%] [14:99.40%] [15:-30.00%] % Reduction in A rea: [13:100.00%] [14:99.30%] [15:13.60%] % Reduction in Volume: [13:Grade 2] [14:Grade 2] [15:Grade 2] Classification: [13:Medium] [14:Medium] [15:Medium] Exudate A mount: [13:Serosanguineous] [14:Serosanguineous] [15:Serosanguineous] Exudate Type: [13:red, brown] [14:red, brown] [15:red, brown] Exudate Color: [13:Distinct, outline attached] [14:Flat and Intact] [15:Distinct, outline attached] Wound Margin: [13:None Present (0%)] [14:Large (67-100%)] [15:Small (1-33%)] Granulation A mount: [13:N/A] [14:Red] [15:N/A] Granulation Quality: [13:None Present  (0%)] [14:Small (1-33%)] [15:Large (67-100%)] Necrotic A mount: [13:Fat Layer (Subcutaneous Tissue): Yes Fat Layer (Subcutaneous Tissue): Yes Fat Layer (Subcutaneous Tissue): Yes] Exposed Structures: [13:Fascia: No Tendon: No Muscle: No Joint: No Bone: No Large (67-100%)] [14:Fascia: No Tendon: No Muscle: No Joint: No Bone: No Medium (34-66%)] [15:Fascia: No Tendon: No Muscle: No Joint: No Bone: No Small (1-33%)] Wound Number: [id number] N/A Photos: No Photos No Photos N/A Right, Plantar Foot Right, Lateral Foot N/A Wound Location: Gradually Appeared Not Known N/A Wounding Event: Diabetic Wound/Ulcer of the Lower Abrasion N/A Primary Etiology: Extremity N/A N/A N/A Secondary Etiology: Coronary Artery Disease, Deep Vein Coronary Artery Disease, Deep Vein N/A Comorbid History: Thrombosis, Hypertension, Myocardial Thrombosis, Hypertension, Myocardial Infarction, Peripheral Arterial Disease, Infarction, Peripheral Arterial Disease, Type II Diabetes Type II Diabetes 09/26/2021 10/10/2021 N/A Date Acquired: 2 0 N/A Weeks of Treatment: Open Open N/A Wound Status: 0.2x0.2x0.1 0.4x0.5x0.1 N/A Measurements L x W x D (cm) 0.031 0.157 N/A A (cm) : rea 0.003 0.016 N/A Volume (cm) : 73.70% N/A N/A % Reduction in Area: 87.50% N/A N/A % Reduction in Volume: Grade 2 Full Thickness Without Exposed N/A Classification: Support Structures Medium Medium N/A Exudate Amount: Serosanguineous Serosanguineous N/A Exudate Type: red, brown red, brown N/A Exudate Color: Flat and Intact N/A N/A Wound Margin: Large (67-100%) Medium (34-66%) N/A Granulation Amount: Pink Red N/A Granulation Quality: None Present (0%) Medium (34-66%) N/A Necrotic Amount: Fat Layer (Subcutaneous Tissue): Yes Fat Layer (Subcutaneous Tissue): Yes N/A Exposed Structures: Fascia: No Fascia: No Tendon: No Tendon: No Muscle: No Muscle: No Joint: No Joint: No Bone: No Bone: No None Small (1-33%)  N/A Epithelialization: Treatment Notes Electronic Signature(s) Signed: 10/10/2021 4:48:32 PM By: [name] MD Signed: 10/11/2021 5:10:09 PM By: [name] RN, BSN Entered By: [name] on 10/10/2021 12:35:48 -------------------------------------------------------------------------------- Multi-Disciplinary Care Plan Details Patient Name: Date of Service: [name], [name] LLY H. 10/10/2021 10:30 A M Medical Record Number: [id number] Patient Account Number: [id number] Date of Birth/Sex: Treating RN: 12/17/1940 (80 y.o. [name] Primary Care Gaylin Osoria: [name] Other Clinician: Referring Denali Sharma: Treating Eddith Mentor/[name]: Tressie Stalker in Treatment: [place] reviewed with physician Active Inactive Nutrition Nursing Diagnoses: Impaired glucose control: actual or potential Potential for alteratiion in Nutrition/Potential for imbalanced nutrition Goals: Patient/caregiver agrees to and verbalizes understanding of need to use nutritional supplements and/or vitamins as prescribed Date Initiated: 06/15/2021 Date Inactivated: 09/20/2021 Target Resolution Date: 09/23/2021 Goal Status: Met Patient/caregiver will maintain therapeutic glucose control Date Initiated: 06/15/2021 Target Resolution Date: 10/17/2021 Goal Status: Active Interventions: Assess HgA1c results as ordered upon admission and as needed Assess patient nutrition upon admission and  as needed per policy Provide education on elevated blood sugars and impact on wound healing Provide education on nutrition Treatment Activities: Education provided on Nutrition : 06/15/2021 Notes: 09/20/21: Glucose control ongoing. Wound/Skin Impairment Nursing Diagnoses: Impaired tissue integrity Knowledge deficit related to ulceration/compromised skin integrity Goals: Patient/caregiver will verbalize understanding of skin care regimen Date Initiated:  06/15/2021 Target Resolution Date: 10/17/2021 Goal Status: Active Interventions: Assess patient/caregiver ability to obtain necessary supplies Assess patient/caregiver ability to perform ulcer/skin care regimen upon admission and as needed Assess ulceration(s) every visit Provide education on ulcer and skin care Notes: 09/20/21: Wound care regimen continues Electronic Signature(s) Signed: 10/10/2021 5:55:40 PM By: Dellie Catholic RN Entered By: Dellie Catholic on 10/10/2021 11:29:13 -------------------------------------------------------------------------------- Pain Assessment Details Patient Name: Date of Service: [name] 10/10/2021 10:30 A M Medical Record Number: [id number] Patient Account Number: [id number] Date of Birth/Sex: Treating RN: [birth year] (81 y.o. [name] Primary Care Dian Laprade: [name] Other Clinician: Referring Kane Kusek: Treating Saurabh Hettich/[name]: Tressie Stalker in Treatment: 16 Active Problems Location of Pain Severity and Description of Pain Patient Has Paino No Site Locations Pain Management and Medication Current Pain Management: Electronic Signature(s) Signed: 10/10/2021 5:55:40 PM By: Dellie Catholic RN Entered By: Dellie Catholic on 10/10/2021 10:47:12 -------------------------------------------------------------------------------- Patient/Caregiver Education Details Patient Name: Date of Service: [name], [name] 1/17/2023andnbsp10:30 A M Medical Record Number: [id number] Patient Account Number: [id number] Date of Birth/Gender: Treating RN: 18-Jun-1941 (81 y.o. [name] Primary Care Physician: [name] Other Clinician: Referring Physician: Treating Physician/[name]: Tressie Stalker in Treatment: 16 Education Assessment Education Provided To: Patient Education Topics Provided Wound/Skin Impairment: Methods: Explain/Verbal Responses:  Return demonstration correctly Electronic Signature(s) Signed: 10/10/2021 5:55:40 PM By: Dellie Catholic RN Entered By: Dellie Catholic on 10/10/2021 11:30:37 -------------------------------------------------------------------------------- Wound Assessment Details Patient Name: Date of Service: [name] 10/10/2021 10:30 A M Medical Record Number: [id number] Patient Account Number: [id number] Date of Birth/Sex: Treating RN: 1941/05/18 (81 y.o. 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 Secondary Open Surgical Wound Wounding Event: Surgical Injury Etiology: Date Acquired: 05/11/2021 Wound Healed - Epithelialized Weeks Of Treatment: 16 Status: Clustered Wound: No Comorbid Coronary Artery Disease, Deep Vein Thrombosis, Hypertension, History: Myocardial Infarction, Peripheral Arterial Disease, Type II Diabetes Photos Photo Uploaded By: [name] on 10/10/2021 18:27:22 Wound Measurements Length: (cm) Width: (cm) Depth: (cm) Area: (cm) Volume: (cm) 0 % Reduction in Area: 100% 0 % Reduction in Volume: 100% 0 Epithelialization: Large (67-100%) 0 0 Wound Description Classification: Grade 2 Wound Margin: Distinct, outline attached Exudate Amount: Medium Exudate Type: Serosanguineous Exudate Color: red, brown Foul Odor After Cleansing: No Slough/Fibrino No Wound Bed Granulation Amount: None Present (0%) Exposed Structure Necrotic Amount: None Present (0%) Fascia Exposed: No Fat Layer (Subcutaneous Tissue) Exposed: Yes Tendon Exposed: No Muscle Exposed: No Joint Exposed: No Bone Exposed: No Treatment Notes Wound #13 (Amputation Site - Transmetatarsal) Wound  Laterality: Left Cleanser Peri-Wound Care Topical Primary Dressing Secondary Dressing Secured With Compression Wrap Compression Stockings Add-Ons Electronic Signature(s) Signed: 10/10/2021 5:55:40 PM By: Dellie Catholic RN Entered By: Dellie Catholic on 10/10/2021 11:34:12 -------------------------------------------------------------------------------- Wound Assessment Details Patient Name: Date of Service: [name] 10/10/2021 10:30 A M Medical Record Number: [id number] Patient Account Number: [id number] Date of Birth/Sex: Treating RN:  [birth year] (81 y.o. [name] Primary Care Joshoa Shawler: [name] Other Clinician: Referring Adriyana Greenbaum: Treating Terion Hedman/[name]: Tressie Stalker in Treatment: 16 Wound Status Wound Number: 14 Primary Diabetic Wound/Ulcer of the Lower Extremity Etiology: Wound Location: Left, Posterior Calcaneus Wound Open Wounding Event: Pressure Injury Status: Date Acquired: 05/11/2021 Comorbid Coronary Artery Disease, Deep Vein Thrombosis, Hypertension, Weeks Of Treatment: 16 History: Myocardial Infarction, Peripheral Arterial Disease, Type II Diabetes Clustered Wound: No Wound Measurements Length: (cm) 0.1 Width: (cm) 0.1 Depth: (cm) 0.1 Area: (cm) 0.008 Volume: (cm) 0.001 % Reduction in Area: 99.4% % Reduction in Volume: 99.3% Epithelialization: Medium (34-66%) Wound Description Classification: Grade 2 Wound Margin: Flat and Intact Exudate Amount: Medium Exudate Type: Serosanguineous Exudate Color: red, brown Foul Odor After Cleansing: No Slough/Fibrino Yes Wound Bed Granulation Amount: Large (67-100%) Exposed Structure Granulation Quality: Red Fascia Exposed: No Necrotic Amount: Small (1-33%) Fat Layer (Subcutaneous Tissue) Exposed: Yes Necrotic Quality: Adherent Slough Tendon Exposed: No Muscle Exposed: No Joint Exposed: No Bone Exposed: No Treatment Notes Wound #14 (Calcaneus)  Wound Laterality: Left, Posterior Cleanser Wound Cleanser Discharge Instruction: Cleanse the wound with wound cleanser prior to applying a clean dressing using gauze sponges, not tissue or cotton balls. Peri-Wound Care Topical Primary Dressing Promogran Prisma Matrix, 4.34 (sq in) (silver collagen) Discharge Instruction: Moisten collagen with saline or hydrogel Secondary Dressing Woven Gauze Sponge, Non-Sterile 4x4 in Discharge Instruction: Apply over primary dressing as directed. ABD Pad, 5x9 Discharge Instruction: Apply over primary dressing as directed. Secured With [employer], 4.5x3.1 (in/yd) Discharge Instruction: Secure with Kerlix as directed. Paper Tape, 2x10 (in/yd) Discharge Instruction: Secure dressing with tape as directed. Compression Wrap Compression Stockings Add-Ons Electronic Signature(s) Signed: 10/10/2021 5:55:40 PM By: Dellie Catholic RN Entered By: Dellie Catholic on 10/10/2021 11:06:38 -------------------------------------------------------------------------------- Wound Assessment Details Patient Name: Date of Service: [name] 10/10/2021 10:30 A M Medical Record Number: [id number] Patient Account Number: [id number] Date of Birth/Sex: Treating RN: 1941-07-06 (81 y.o. [name] Primary Care Lulla Linville: [name] Other Clinician: Referring Ronold Hardgrove: Treating Rashelle Ireland/[name]: Tressie Stalker in Treatment: 16 Wound Status Wound Number: 15 Primary Diabetic Wound/Ulcer of the Lower Extremity Etiology: Wound Location: Right Amputation Site - Transmetatarsal Wound Open Wounding Event: Other Lesion Status: Date Acquired: 09/15/2021 Comorbid Coronary Artery Disease, Deep Vein Thrombosis, Hypertension, Weeks Of Treatment: 2 History: Myocardial Infarction, Peripheral Arterial Disease, Type II Diabetes Clustered Wound: No Wound Measurements Length: (cm) 0.5 Width: (cm) 1.3 Depth: (cm)  0.2 Area: (cm) 0.511 Volume: (cm) 0.102 % Reduction in Area: -30% % Reduction in Volume: 13.6% Epithelialization: Small (1-33%) Wound Description Classification: Grade 2 Wound Margin: Distinct, outline attached Exudate Amount: Medium Exudate Type: Serosanguineous Exudate Color: red, brown Foul Odor After Cleansing: No Slough/Fibrino Yes Wound Bed Granulation Amount: Small (1-33%) Exposed Structure Necrotic Amount: Large (67-100%) Fascia Exposed: No Necrotic Quality: Adherent Slough Fat Layer (Subcutaneous Tissue) Exposed: Yes Tendon Exposed: No Muscle Exposed: No Joint Exposed: No Bone Exposed: No Treatment Notes 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 Peri-Wound Care Topical Primary Dressing Promogran Prisma Matrix, 4.34 (sq in) (silver collagen) Discharge Instruction: Moisten collagen with [place] Jelly Secondary Dressing Woven Gauze Sponges 2x2 in Discharge Instruction: Apply over primary dressing as directed. Secured With [employer], 4.5x3.1 (in/yd) Discharge Instruction: Secure with Kerlix as directed. Transpore Surgical Tape, 2x10 (in/yd) Discharge Instruction: Secure dressing  with tape as directed. Netting Compression Wrap Compression Stockings Add-Ons Electronic Signature(s) Signed: 10/10/2021 5:55:40 PM By: Dellie Catholic RN Entered By: Dellie Catholic on 10/10/2021 11:04:41 -------------------------------------------------------------------------------- Wound Assessment Details Patient Name: Date of Service: [name] 10/10/2021 10:30 A M Medical Record Number: [id number] Patient Account Number: [id number] Date of Birth/Sex: Treating RN: [birth year] (81 y.o. [name] Primary Care Annamaria Salah: [name] Other Clinician: Referring Xaniyah Buchholz: Treating  Timoteo Carreiro/[name]: Tressie Stalker in Treatment: 16 Wound Status Wound Number: 16 Primary Diabetic Wound/Ulcer of the Lower Extremity Etiology: Wound Location: Right, Plantar Foot Wound Open Wounding Event: Gradually Appeared Status: Date Acquired: 09/26/2021 Comorbid Coronary Artery Disease, Deep Vein Thrombosis, Hypertension, Weeks Of Treatment: 2 History: Myocardial Infarction, Peripheral Arterial Disease, Type II Diabetes Clustered Wound: No Wound Measurements Length: (cm) 0.2 Width: (cm) 0.2 Depth: (cm) 0.1 Area: (cm) 0.031 Volume: (cm) 0.003 % Reduction in Area: 73.7% % Reduction in Volume: 87.5% Epithelialization: None Wound Description Classification: Grade 2 Wound Margin: Flat and Intact Exudate Amount: Medium Exudate Type: Serosanguineous Exudate Color: red, brown Foul Odor After Cleansing: No Slough/Fibrino Yes Wound Bed Granulation Amount: Large (67-100%) Exposed Structure Granulation Quality: Pink Fascia Exposed: No Necrotic Amount: None Present (0%) Fat Layer (Subcutaneous Tissue) Exposed: Yes Tendon Exposed: No Muscle Exposed: No Joint Exposed: No Bone Exposed: No Treatment Notes Wound #16 (Foot) Wound Laterality: Plantar, Right Cleanser Soap and Water Discharge Instruction: May shower and wash wound with dial antibacterial soap and water prior to dressing change. Peri-Wound Care Topical Primary Dressing Promogran Prisma Matrix, 4.34 (sq in) (silver collagen) Discharge Instruction: Moisten collagen with [place] Jelly Secondary Dressing Woven Gauze Sponges 2x2 in Discharge Instruction: Apply over primary dressing as directed. Secured With [employer], 4.5x3.1 (in/yd) Discharge Instruction: Secure with Kerlix as directed. Transpore Surgical Tape, 2x10 (in/yd) Discharge Instruction: Secure dressing with tape as directed. Netting Compression Wrap Compression Stockings Add-Ons Electronic Signature(s) Signed:  10/10/2021 5:55:40 PM By: Dellie Catholic RN Entered By: Dellie Catholic on 10/10/2021 11:11:33 -------------------------------------------------------------------------------- Wound Assessment Details Patient Name: Date of Service: [name] 10/10/2021 10:30 A M Medical Record Number: [id number] Patient Account Number: [id number] Date of Birth/Sex: Treating RN: 02-19-1941 (81 y.o. [name] Primary Care Levora Werden: [name] Other Clinician: Referring Lachrisha Ziebarth: Treating Julitza Rickles/[name]: Tressie Stalker in Treatment: 16 Wound Status Wound Number: 17 Primary Abrasion Etiology: Wound Location: Right, Lateral Foot Wound Open Wounding Event: Not Known Status: Date Acquired: 10/10/2021 Comorbid Coronary Artery Disease, Deep Vein Thrombosis, Hypertension, Weeks Of Treatment: 0 History: Myocardial Infarction, Peripheral Arterial Disease, Type II Diabetes Clustered Wound: No Wound Measurements Length: (cm) 0.4 Width: (cm) 0.5 Depth: (cm) 0.1 Area: (cm) 0.157 Volume: (cm) 0.016 % Reduction in Area: % Reduction in Volume: Epithelialization: Small (1-33%) Wound Description Classification: Full Thickness Without Exposed Support Structures Exudate Amount: Medium Exudate Type: Serosanguineous Exudate Color: red, brown Foul Odor After Cleansing: No Slough/Fibrino Yes Wound Bed Granulation Amount: Medium (34-66%) Exposed Structure Granulation Quality: Red Fascia Exposed: No Necrotic Amount: Medium (34-66%) Fat Layer (Subcutaneous Tissue) Exposed: Yes Necrotic Quality: Adherent Slough Tendon Exposed: No Muscle Exposed: No Joint Exposed: No Bone Exposed: No Treatment Notes Wound #17 (Foot) Wound Laterality: Right, Lateral Cleanser Peri-Wound Care Topical Primary Dressing Secondary Dressing Secured With Compression Wrap Compression Stockings Add-Ons Electronic Signature(s) Signed: 10/10/2021 5:55:40 PM By: Dellie Catholic RN Entered By: Dellie Catholic on 10/10/2021 11:18:00 -------------------------------------------------------------------------------- Vitals Details Patient Name: Date of Service: [name], [name] LLY H. 10/10/2021 10:30 A  M Medical Record Number: [id number] Patient Account Number: [id number] Date of Birth/Sex: Treating RN: 1941-07-01 (81 y.o. [name] Primary Care Ivonne [place]: [name] Other Clinician: Referring Lisaann Atha: Treating Nathanal Hermiz/[name]: Tressie Stalker in Treatment: 16 Vital Signs Time Taken: 10:43 Temperature (F): 97.6 Height (in): 64 Pulse (bpm): 82 Weight (lbs): 135 Respiratory Rate (breaths/min): 18 Body Mass Index (BMI): 23.2 Blood Pressure (mmHg): 162/68 Reference Range: 80 - 120 mg / dl Electronic Signature(s) Signed: 10/10/2021 5:55:40 PM By: Dellie Catholic RN Entered By: Dellie Catholic on 10/10/2021 11:12:41

## 2021-10-12 ENCOUNTER — Other Ambulatory Visit: Payer: Self-pay

## 2021-10-12 ENCOUNTER — Ambulatory Visit (INDEPENDENT_AMBULATORY_CARE_PROVIDER_SITE_OTHER): Payer: Medicare Other | Admitting: Internal Medicine

## 2021-10-12 ENCOUNTER — Telehealth: Payer: Self-pay

## 2021-10-12 VITALS — BP 182/68 | HR 69 | Resp 16 | Ht 64.4 in | Wt 143.0 lb

## 2021-10-12 DIAGNOSIS — M869 Osteomyelitis, unspecified: Secondary | ICD-10-CM

## 2021-10-12 DIAGNOSIS — D638 Anemia in other chronic diseases classified elsewhere: Secondary | ICD-10-CM | POA: Diagnosis not present

## 2021-10-12 DIAGNOSIS — E11628 Type 2 diabetes mellitus with other skin complications: Secondary | ICD-10-CM | POA: Diagnosis not present

## 2021-10-12 DIAGNOSIS — E1169 Type 2 diabetes mellitus with other specified complication: Secondary | ICD-10-CM | POA: Diagnosis not present

## 2021-10-12 DIAGNOSIS — I10 Essential (primary) hypertension: Secondary | ICD-10-CM

## 2021-10-12 DIAGNOSIS — I70202 Unspecified atherosclerosis of native arteries of extremities, left leg: Secondary | ICD-10-CM | POA: Diagnosis not present

## 2021-10-12 DIAGNOSIS — E11621 Type 2 diabetes mellitus with foot ulcer: Secondary | ICD-10-CM | POA: Diagnosis not present

## 2021-10-12 DIAGNOSIS — Z4781 Encounter for orthopedic aftercare following surgical amputation: Secondary | ICD-10-CM | POA: Diagnosis not present

## 2021-10-12 DIAGNOSIS — Z23 Encounter for immunization: Secondary | ICD-10-CM

## 2021-10-12 DIAGNOSIS — I739 Peripheral vascular disease, unspecified: Secondary | ICD-10-CM | POA: Diagnosis not present

## 2021-10-12 DIAGNOSIS — L89620 Pressure ulcer of left heel, unstageable: Secondary | ICD-10-CM | POA: Diagnosis not present

## 2021-10-12 DIAGNOSIS — L97509 Non-pressure chronic ulcer of other part of unspecified foot with unspecified severity: Secondary | ICD-10-CM | POA: Diagnosis not present

## 2021-10-12 NOTE — Progress Notes (Signed)
Patient ID: [name], female   DOB: Nov 23, 1940, 81 y.o.   MRN: [id number]  HPI  Kinsie is a 81yo F with vascular disease, DM, hx of PsA DFU-osteomyelitis s/p right TMA. She is S/P Right external iliac, common femoral, profundofemoral, and superficial femoral endarterectomy with bovine pericardial patch angioplasty by Dr. Trula Slade for right toe gangrene.  Then TMA right foot by Dr. Earleen Newport DPM.  She went on the require Right   common femoral to above-knee popliteal artery bypass with 6 mm ringed PTFE.  Followed by Dr. Donzetta Matters 08/16/20. She has developed right TMA wound over medial aspect of previous TMA amputation and slightly on lateral aspect over 5th MT area. She follows with dr Dellia Nims, where she undergoes wound care. 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 The Patient noticed that she started to has some dehiscence of wound with surrounding erythema to dorsum of foot. She was placed on amox/clav and silver collagen dressing changes for which she reports some improvement with erythema. 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  She has upcoming angiogram tomorrow to see if can improve vascularization of right leg.  Outpatient Encounter Medications as of 10/12/2021  Medication Sig   acetaminophen (TYLENOL) 500 MG tablet Take 1,000 mg by mouth every 6 (six) hours as needed for moderate pain or headache.   amoxicillin-clavulanate (AUGMENTIN) 875-125 MG tablet Take 1 tablet by mouth 2 (two) times daily.   aspirin EC 81 MG tablet Take 81 mg by mouth at bedtime.    atorvastatin (LIPITOR) 80 MG tablet Take 1 tablet (80 mg total) by mouth daily. Patient must keep upcoming appointment to receive future refills. (Patient taking differently: Take 80 mg by mouth every evening. Patient must keep upcoming appointment to receive future refills.)   brimonidine (ALPHAGAN) 0.2 % ophthalmic solution Place 1 drop  into the right eye 2 (two) times daily.   CINNAMON PO Take 2 tablets by mouth in the morning, at noon, and at bedtime.    empagliflozin (JARDIANCE) 25 MG TABS tablet Take 1 tablet (25 mg total) by mouth daily before breakfast.   FEROCON capsule Take 1 capsule by mouth 2 (two) times daily.   glimepiride (AMARYL) 2 MG tablet TAKE 1 TABLET BY MOUTH ONCE DAILY BEFORE BREAKFAST   glucose blood (ONETOUCH VERIO) test strip Use to check blood sugars once daily   hydrochlorothiazide (HYDRODIURIL) 12.5 MG tablet Take 1 tablet by mouth once daily   Lancets (ONETOUCH DELICA PLUS [id number]) MISC Use to check blood sugars twice daily.   metFORMIN (GLUCOPHAGE) 1000 MG tablet Take 1 tablet by mouth twice daily (Patient taking differently: Take 1,000 mg by mouth in the morning.)   metoprolol tartrate (LOPRESSOR) 25 MG tablet TAKE 1 & 1/2 (ONE & ONE-HALF) TABLETS BY MOUTH TWICE DAILY   pantoprazole (PROTONIX) 40 MG tablet Take 1 tablet (40 mg total) by mouth daily. (Patient taking differently: Take 40 mg by mouth every Monday, Wednesday, and Friday.)   Probiotic Product ([employer] SENIOR PROBIOTIC PO) Take 1 capsule by mouth every evening.   Propylene Glycol (SYSTANE BALANCE) 0.6 % SOLN Place 1 drop into the left eye 3 (three) times daily as needed (dry/irritated eyes).   pyridoxine (B-6) 100 MG tablet Take 100 mg by mouth every evening.    repaglinide (PRANDIN) 1 MG tablet Take 1 tablet (1 mg total) by mouth daily before breakfast.  telmisartan (MICARDIS) 80 MG tablet Take 1 tablet by mouth once daily   XARELTO 2.5 MG TABS tablet Take 1 tablet by mouth twice daily   Facility-Administered Encounter Medications as of 10/12/2021  Medication   sodium chloride flush (NS) 0.9 % injection 3 mL     Patient Active Problem List   Diagnosis Date Noted   Osteomyelitis of foot, left, acute ([place]) 04/27/2021   Cellulitis in diabetic foot ([place]) 04/27/2021   Acute kidney injury superimposed on chronic kidney disease ([place])  04/27/2021   Hyponatremia 04/27/2021   Anemia, chronic disease 04/27/2021   Cellulitis of left lower extremity    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[place]) 08/07/2019   Critical lower limb ischemia ([place]) 08/03/2019   Encounter for immunization 07/09/2016   Essential hypertension 03/31/2013   Dyslipidemia 03/31/2013   DM2 (diabetes mellitus, type 2) ([place]) 03/31/2013   Overweight (BMI 25.0-29.9) 03/31/2013   Carotid artery disease ([place]) 03/31/2013   PAD (peripheral artery disease) ([place]) 03/31/2013     Health Maintenance Due  Topic Date Due   Hepatitis C Screening  Never done   FOOT EXAM  05/02/2019   COVID-19 Vaccine (4 - Booster for Pfizer series) 09/08/2020   TETANUS/TDAP  01/21/2021   INFLUENZA VACCINE  04/24/2021     Review of Systems 12 point ros is negative except what is mentioned above Physical Exam  BP (!) 182/68    Pulse 69    Resp 16    Ht 5' 4.4" (1.636 m)    Wt 143 lb (64.9 kg)    SpO2 98%    BMI 24.24 kg/m  Physical Exam  Constitutional:  oriented to person, place, and time. appears well-developed and well-nourished. No distress.  HENT: [name]/AT, PERRLA, no scleral icterus Mouth/Throat: Oropharynx is clear and moist. No oropharyngeal exudate.  Cardiovascular: Normal rate, regular rhythm and normal heart sounds. Exam reveals no gallop and no friction rub.  No murmur heard.  Pulmonary/Chest: Effort normal and breath sounds normal. No  respiratory distress.  has no wheezes.  Neck = supple, no nuchal rigidity Ext: right foot with TMA, small opening about medial incision trace surrounding erythema to dorsum of foot. Yellowish drainage of dressing Neurological: alert and oriented to person, place, and time.  Skin: Skin is warm and dry. No rash noted. No erythema.  Psychiatric: a normal mood and affect.  behavior is normal.    CBC Lab Results  Component Value Date   WBC 9.9 09/29/2021   RBC 4.07 09/29/2021   HGB 11.9 (L) 09/29/2021   HCT 36.6 09/29/2021   PLT 258.0 09/29/2021   MCV 90.0 09/29/2021   MCH 28.7 05/15/2021   MCHC 32.4 09/29/2021   RDW 16.8 (H) 09/29/2021   LYMPHSABS 1.2 04/27/2021   MONOABS 1.9 (H) 04/27/2021   EOSABS 0.0 04/27/2021    BMET Lab Results  Component Value Date   NA 138 09/29/2021   K 5.6 No hemolysis seen (H) 09/29/2021   CL 103  09/29/2021   CO2 28 09/29/2021   GLUCOSE 202 (H) 09/29/2021   BUN 34 (H) 09/29/2021   CREATININE 0.97 09/29/2021   CALCIUM 10.1 09/29/2021   GFRNONAA >60 05/15/2021   GFRAA 75 07/29/2020      Assessment and Plan   Hx of hyperkalemia = unclear if amox/clav - will stop it and recheck bmp. Give advice with labs tomorrow  Acute on chronic osteo = will check sed rate, crp, and cbc. Given they are increased plus clinical history/presentation. She will likely to need picc line plus iv meropenem plus dapto @ 8mg /kg/day.  Will have staff arrange for picc line and iv abtx   Hypertension = undergoing angiogram. Likely to have serial vitals to see if her regimen for htn needs to be changed  Health maintenance =will give bivalent covid vaccine today

## 2021-10-12 NOTE — Telephone Encounter (Signed)
Rcvd Covid Bivalent and waited 15 min with no reaction to vaccine. Documented in Immunization but could not add to Covid schedule. Have messaged team to see if this can be edited for [facility] tracking.

## 2021-10-12 NOTE — Progress Notes (Signed)
[name], [name] ([id number]) Visit Report for 09/12/2021 Cellular or Tissue Based Product Details Patient Name: Date of Service: [name] 09/12/2021 1:00 PM Medical Record Number: [id number] Patient Account Number: [id number] Date of Birth/Sex: Treating RN: [birth year] (81 y.o. [name], [name]: [name] Other Clinician: Referring Provider: Treating Provider/[name]: Tressie Stalker in Treatment: 12 Cellular or Tissue Based Product Type Wound #13 Left Amputation Site - Transmetatarsal Applied to: Performed By: Physician Ricard Dillon., MD Cellular or Tissue Based Product Type: Apligraf Level of Consciousness (Pre-procedure): Awake and Alert Pre-procedure Verification/Time Out No Taken: Location: genitalia / hands / feet / multiple digits Wound Size (sq cm): 1.4 Product Size (sq cm): 44 Waste Size (sq cm): 0 Amount of Product Applied (sq cm): 44 Instrument Used: Forceps, Scissors Lot #: [id number] Expiration Date: 09/15/2021 Fenestrated: Yes Instrument: Blade Reconstituted: Yes Solution Type: NS Solution Amount: 10 ML Lot #: [id number] Solution Expiration Date: 05/25/2022 Secured: Yes Secured With: Steri-Strips Dressing Applied: Yes Primary Dressing: AQDAPTIC Procedural Pain: 0 Post Procedural Pain: 0 Response to Treatment: Procedure was tolerated well Level of Consciousness (Post- Awake and Alert procedure): Post Procedure Diagnosis Same as Pre-procedure Electronic Signature(s) Signed: 09/12/2021 4:29:35 PM By: [name] MD Entered By: [name] on 09/12/2021 13:50:58 -------------------------------------------------------------------------------- HPI Details Patient Name: Date of Service: [name], [place]. 09/12/2021 1:00 PM Medical Record Number: [id number] Patient Account Number: [id number] Date of Birth/Sex: Treating RN: 1940/09/26 (81 y.o. [name] Primary Care  Provider: [name] Other Clinician: Referring Provider: Treating Provider/[name]: Tressie Stalker in Treatment: 12 History of Present Illness HPI Description: ADMISSION 07/13/2019 Patient is a 81 year old type II diabetic. She has known PAD. She has been followed by Dr. Jacqualyn Posey of podiatry for blistering areas on her toes dating back to 04/30/2019 which at this was the left fourth toe. By 8/11 she had wounds on the right and left second toes. She underwent arterial studies by Dr. Alvester Chou on 7/31 that showed ABIs on the right of 0.60 TBI of 0.26 on the left ABI of 0.56 and a TBI of 0.25. By 9/10 she had ischemic-looking wounds per Dr. Earleen Newport on the right first, left first second and third. She has been using Medihoney and then mupirocin more recently simply Betadine. The patient underwent angiography by Dr. Gwenlyn Found on 9/21. 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 Noted that there was 1 vessel runoff in the foot via the peroneal. On the left there was an 80% left SFA, 70% mid left SFA. There was a short segment calcified CTO distal less than SFA and above-knee popliteal artery reconstituting with two-vessel runoff. 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 It was felt that she had bilateral SFA disease as well as tibial vessel disease. An attempt was made to revascularize the left SFA but they were unable to cross because of the highly calcified nature of the lesion. 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 She has an area on the medial left fourth toe with raised horned callus on top of this. I am not certain what this represents. With regards to pain she has about a 2-1/2 I will claudication tolerance in the grocery  store. She has some  pain in night which is relieved by putting her feet down over the side of the bed. She is wearing Nitro-Dur patches on the top of her right foot. Past medical history includes type 2 diabetes with secondary PAD, neoplasm of the skin, diabetic retinopathy, carotid artery stenosis, hyperlipidemia hypertension. 11/2; the last time the patient was here I spoke to Dr. Gwenlyn Found about revascularization options on the right. As I understand things currently Dr. Gwenlyn Found spoke with Dr. Trula Slade and ultimately the patient was taken to surgery on 10/27. She had a right iliofemoral endarterectomy with a bovine patch angioplasty. I think the plan now is for her to have a staged intervention on the right SFA by Dr. Alvester Chou. Per the patient's understanding Dr. Gwenlyn Found and Dr. Jacqualyn Posey are waiting to see when the gangrenous toes on the right foot can be amputated. The patient states her pain is a lot better and she is grateful for that. 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 Small area on the left fourth toe. She is using Betadine to the mummified areas on the right and Medihoney on the left 08/27/2019; the last time I saw this patient I discharged her from the clinic. She had been revascularized by Dr. Trula Slade and she had a right iliofemoral endarterectomy with a bovine angioplasty. 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 Jacqualyn Posey of podiatry on 08/07/2019. I note that she also had a intervention by Dr. Gwenlyn Found and he performed a directional atherectomy and drug-coated balloon angioplasty of the SFA and popliteal artery on the right. I am not certain of the date of Dr. Kennon Holter procedure as of the time of this dictation. She was referred back to [place] by Dr. Earleen Newport predominantly for follow-up of the left fourth toe. She still has sutures and stitches in the right TMA site. She states her pain is a lot better. She expresses  concern about the condition of the amputation site at the TMA. She is on doxycycline I think prescribed by Dr. Earleen Newport. She is complaining of some pain at night 12/10; I spoke to Dr. Jacqualyn Posey last week. He removed the sutures on the right foot on Monday of this week. She has the area on the left fourth toe just proximal to the PIP and then the right TMA site. She is still on doxycycline and has enough through next week. Unfortunately the TMA site does not look good at all. Both on the lateral and medial part of the incisions are areas that probe to bone. There is purulence over the medial part which I have cultured. We will use silver alginate. Left fourth toe looks somewhat better but there was still exposed bone 12/17; patient has an MRI booked for 12/30. Culture I did last week showed Pseudomonas Serratia and Enterococcus. This was purulent drainage coming out of the medial part of her amputation site. I use cefdinir 300 twice daily for 10 days that started on 12/15. Her x-ray on the right showed limited evaluation for osteomyelitis. The findings could have been postoperative. There was subtle erosion in the distal first and distal fifth metatarsal. An MRI was suggested. On the left she had irregularity of the fourth middle and proximal phalanx consistent with a history of osteomyelitis. I do not know that she has a history of osteomyelitis in this area. She had a newly defined area on the plantar third toe 12/31; patient's MRI is listed below: MRI OF  THE RIGHT FOREFOOT WITHOUT AND WITH CONTRAST TECHNIQUE: Multiplanar, multisequence MR imaging of the right forefoot was performed before and after the administration of intravenous contrast. CONTRAST 6 mL GADAVIST IV SOLN : COMPARISON: Plain films right foot 09/04/2019. FINDINGS: Bones/Joint/Cartilage The patient is status post transmetatarsal amputation as seen on the prior exam. Marrow edema and enhancement are seen in all of the distal  metatarsals. In the first metatarsal, signal change extends 1.5 cm proximal to the stump and in the second metatarsal extends approximately 2 cm proximal to the stump. Edema and enhancement are seen in only the distal 0.7 cm of the third metatarsal stump and tips of the fourth and fifth metatarsals. Bone marrow signal is otherwise unremarkable. A small focus of subchondral edema is seen in the lateral talus, likely degenerative. Ligaments Intact. Muscles and Tendons No intramuscular fluid collection. Soft tissues Skin ulceration is seen off the stump of the first metatarsal. A thin fluid collection tracks deep to the wound and over the anterior metatarsals worrisome for small abscess. Intense subcutaneous edema and enhancement are seen diffusely. 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 Cellulitis about the foot. Skin ulceration over the distal first metatarsal is identified with a thin fluid collection tracking anteriorly along the stump worrisome for abscess. Small focus of subchondral edema in the lateral talus is likely degenerative. Electronically Signed By: Inge Rise M.D. On: 09/23/2019 15:25 Patient arrives in clinic today not complaining any of any pain. 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 She does not describe claudication however her activity is very limited. 10/01/2019; since the patient was last here I have communicated with Dr. Gwenlyn Found after bypass by Dr. Trula Slade and addressing the right superficial femoral artery he states that she is widely patent through peroneal arteries to the ankle with collaterals to the dorsalis pedis. He states he is going to talk to colleagues about the feasibility of tibial pedal access. The patient seems  infectious disease later this afternoon Dr. Baxter Flattery. in preparation for this she has been off antibiotics for 1 week and I went ahead and obtain pieces of the remanence of her first metatarsal for pathology and CandS. The patient is a candidate for hyperbaric oxygen. 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 1/15; considerable improvement in the wounds on her feet. We are using silver collagen. She follows with Dr. Baxter Flattery of infectious disease and is on meropenem and daptomycin. She has been taught how to do this herself at home. Follows with Dr. Graylon Good at the end of treatment here. She has 2 wounds on the surgical TMA site 1 lateral and 1 medial the lateral 1 has regressed tremendously. The area medially still has some exposed bone although the base of this looks healthy. 1/22; 2 separate wounds on the surgical TMA site. Both of these look satisfactory. The medial area does not have any exposed bone. This is an improvement On the left side fourth toe dorsally over the proximal phalanx there is a deep punched out area that probes to bone. She has an area on the tip of the left third toe. She also tells [place] that in HBO she traumatized her left Achilles and this is left her with a superficial wound area 1/28; weekly visit along with HBO. She has 5 wounds. T our  punched out areas on the original TMA site on the right. Both of these appear to have contracted. o The area on the right no longer has exposed bone. She has an area on the tip of the left third toe and on the DIP of the left fourth toe. Both of these had surface callus that I removed and unfortunately they have small areas that both go to bone. She has a traumatic wound on the left Achilles area that happened in HBO and that appears better. She is tolerating her IV antibiotics well at home. She has home health changing the dressings and she is doing it once on the weekends. We have been using silver  collagen. She has been extensively revascularized on the right by Dr. Trula Slade and subsequently by Dr. Gwenlyn Found. 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 Baxter Flattery of infectious disease on 2/17. Dr. Gwenlyn Found on 2/23 and according to her her antibiotics stopped on 2/23. She is tolerating hyperbarics well. She has made a tremendous improvement in the right forefoot with only 2 smaller open wounds. Using silver collagen. On the left foot she has the area on the tip of the left third toe and the medial part of the left fourth toe. These had exposed bone last week I did not sense any of that today 2/12; sees Dr. Graylon Good on 2/17 and Dr. Gwenlyn Found on 2/23. We are going to use Dermagraft on this today however the lateral part of her train TMA incision on the right is healed and the medial part is down so much that we just continue with the silver collagen She has wounds on the tip of her left third and the medial aspect of the left fourth. Both of these still have exposed bone I have not been able to get these to epithelialize. 2/19; she sees Dr. Gwenlyn Found on 2/23 and I have communicated with him about the left vascular supply. Looking at her angiogram from 08/03/2019 it looks as though that they could not cross the left CFA. Noted that she had one-vessel runoff bilaterally. She also apparently saw Dr. Graylon Good although I did not look up these results for preparation of this record. 2/26; Dr. Gwenlyn Found is going to do an angiogram next week on Wednesday. I think they are going to try to go at this both retrograde and anterograde to see if anything can be done to revascularize the left lower limb. She continues to make nice progress on the remaining wound on the right medial foot on her TMA site and the toe it wounds on the left are responding nicely as well 3/12; the patient underwent an extensive and  complicated revascularization on the left leg by Dr. Fletcher Anon on 3/3; she had an atherectomy on occluded left popliteal artery; anterior tibial artery followed by a balloon angioplasty. Atherectomy was also performed to the left SFA because there was still significant 50% stenosis in the left popliteal artery they performed an intravascular lithotripsy which improved the residual stenosis to 20%. The same lithotripsy was used to dilate the proximal left SFA. She had a drug-eluting stent placed I believe in the SFA. The patient returned for hyperbarics this week. She has had some eye problems on the right which she tells me are secondary to diabetic retinopathy and she saw her eye doctor. She is really made excellent progress there  is no open wounds on the left foot at all. She has one open area medially and her TMA site on the right lateral wound is closed. 3/19; the patient comes in today with the area on the medial transmet on the right improved. Some debris removed from the surface revealed the still open wound. Unfortunately she now has the open area on the third toe tip and the medial aspect of the dorsal fourth toe. These are in the same location as her previous wounds. These had actually closed up last week. 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 She is undergone complex revascularizations on the right by Dr. Trula Slade and Dr. Gwenlyn Found and I believe Dr. 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 She completed IVantibiotics as directed by infectious disease and I believe a course of oral antibiotics as well.she underwent a complete 40 treatments of hyperbarics. She comes in today with the vast majority of the  right TMA site healed there is a small opening on the most medial aspect. Her left third and left fourth toes appear to have epithelialization although this has fluctuated somewhat. 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 She had a gaping wound on the right transmetatarsal amputation L as well as deep wounds on the left third and left fourth toes. She underwent complex revascularizations on her bilateral lower legs which I described on her last visit. She completed IV antibiotics by infectious disease and 40 treatments of hyperbarics. Her left third and fourth toes are healed. The area on the right transmetatarsal amputation site is also closed today. READMISSION 08/04/2020 [name] is now a 81 year old woman that we had for a complex stay in our clinic from October 2000 14 January 2020. 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 Her current problems with wounds began on October 30 when she developed a spontaneous opening over the right transmet medial aspect over the first metatarsal head. She has been using collagen on this that she had leftover from her last stay in this clinic. She also has had an open area on the medial right calf from a vein harvest site from her CABG she. She said that this is never really closed since the vein harvest was done. With regards to her arterial status this is a major issue. She had an angiogram by Dr. Gwenlyn Found in September 2020. She developed a gangrenous right first and second toes. Ultimately she was referred to Dr. Trula Slade who did a right common femoral endarterectomy with patch angioplasty. This was on 07/21/2019 and she really had a good result. Dr. Gwenlyn Found did a atherectomy followed by a drug coated balloon angioplasty of her right SFA popliteal and tibial peroneal trunk on 08/03/2019 again with excellent  excellent results. Follow-up.Dopplers in November revealed revealed a widely patent SFA and tibioperoneal trunk. UNFORTUNATELY her recent angiogram that was done on 08/01/2020 showed an 80% right common femoral artery stenosis just above the previous endarterectomy site occluded right right SFA at the origin reconstituting the adductor canal by the profunda femoris collaterals. The profunda  femoris is also diffusely diseased. There is and again another 80% tibioperoneal trunk stenosis and one-vessel runoff via the peroneal artery. She is seen Dr. Donzetta Matters and she is scheduled for a femoral endarterectomy and right femoral-popliteal bypass grafting with endarterectomy of her tibial peroneal trunk. The scheduled surgery is on 11/23 The patient does not complain of pain at rest. She does have 5-minute walking claudication. The wounds on her right first met head remanent was spontaneous she does not think she hit this. As mentioned in the area on her right medial calf is a vein harvest site Dr. Gwenlyn Found had texted me about getting her in the clinic and we have arranged this as soon as we can. I have told her that we will have to see how successful Dr. Donzetta Matters is before we know what can be done about the wounds in a precise fashion. Fortunately there does not seem to be any obvious infection involving the right foot although that may need to be looked at if things really deteriorate. She was treated with IV antibiotics and hyperbaric oxygen for her previous osteomyelitis 12/2; the patient underwent a right femoral endarterectomy and bypass femoral to popliteal artery. This was done on 08/16/2020. She is done remarkably well. She has a small area on the right anterior tibia and a small area in the medial part of her original right transmetatarsal amputation. She appears to have had an excellent response to surgery. 12/16; the patient's area on the right tibia and the small area on the medial part of her original right  transmetatarsal amputation is totally healed READMISSION 06/15/2021 [name] is a now 81 year old woman who arrives for review of wounds on the left TMA site extending into her plantar foot as well as an area on the left posterior calcaneus. 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 She was extensively revascularized on the right at that time as noted above in our previous notes ended up receiving IV antibiotics and hyperbaric oxygen therapy. Miraculously this TMA site actually closed and she really has had not any trouble with this since. UNFORTUNATELY the same cannot be said of her left side. Her problem apparently started sometime in July when she hit her left third toe on a barstool while playing pool. She developed an acute infection which eventually led to underlying osteomyelitis. She underwent an extensive hospitalization from 04/27/2021 through 05/15/2021. She received IV antibiotics. Also extensive intervention by Dr. Stanford Breed of vein and vascular which included a left common femoral to tibioperoneal trunk bypass as well as a left third toe amputation on 05/03/2021. Unfortunately the area did not heal she ended up requiring a left TMA. By her description this was left open. She has been using a wound VAC on this to earlier this week and then was switched to Santyl. I think because of the left forefoot off loader she is also developed an area on the left posterior calcaneus. She is not complaining of a lot of pain. She has her daughter at home to change the dressings. She has been prescribed Santyl and she has a tube that she is brought into our clinic. I cannot get a lot of history of claudication although she is really not that mobile at this point walking with a walker. 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 She has had multiple interventions on the right side by vein and vascular and also by Dr. Gwenlyn Found as well as a transmetatarsal amputation on the right. She has had follow-up noninvasive studies on 06/06/2021; on the left that showed an ABI of 0.47 with monophasic waveforms a dorsalis pedis ABI of 0.40 with monophasic waveforms. Her ANGIOGRAM which was initially done by Dr. Oneida Alar on 04/28/2021 showed severe left foot superficial femoral artery occlusive disease and an occlusion of the left popliteal artery with two-vessel runoff to the left foot with moderate to severe disease below the knee popliteal artery and tibial disease. She had 80% distal right external iliac artery above the existing femoral above-knee popliteal bypass but she does not have an open wound on the right foot She arrives in clinic with an extensive open area on the TMA site which in the mid aspect extends into the plantar foot. She also has a more superficial area on the upper part of her Achilles area of the left heel 9/29; patient arrives in clinic today with slough over the TMA site. We use MolecuLight to look at the surface of this suggesting cyan and white discoloration over a large part of this wound also extending into the third metatarsal area. She also has the area on the left posterior calcaneus. This is slough covered we will switch to [facility] here as well. There is some warmth in the area and some erythema we will give her antibiotics as well 10/6; completely necrotic surface once again on the left TMA site. We have been using Santyl and Hydrofera Blue. She has an area on the left posterior Achilles and actually states this has been more painful this week indeed there is some erythema around this area. She also describes I think claudication at rest which is relieved by dipping her left leg over the side of the bed at night. 10/13; our intake nurse was able to brush some  slough off the surface of the left TMA hence this did not require debridement that is better than last week. The area heading towards the plantar part of her foot required an aggressive debridement to clean this up. This is quite a sizable divot but does not go to bone. The area on the right Achilles heel is also covered in a high 100% nonviable tissue we have been using Santyl and silver alginate 10/20; the area on the left TMA looks much better. Even the area is spreading into the midfoot looks improved. The right Achilles still is covered and nonviable surface 100%. We have been using Santyl and silver alginate and this will continue for this week The patient is approved for 5 Apligraf's and I think she is good enough to start using these next week 10/27; the patient's wounds actually look quite good. We have been using Santyl and silver alginate. We applied Apligraf #1 today Saw Dr. Stanford Breed her vascular surgeon on 10/25 he told her TMA was healing nicely. She will follow-up with them in December to repeat noninvasive testing. 08/16/2021 upon evaluation today patient appears to be doing much better in regard to her wounds. Both are showing signs of improvement which is great news. I am going to perform some debridement to clear away some of the necrotic debris currently this will just be a light debridement. Nonetheless other than that I feel like that the Apligraf is doing quite well working and reapply today and this will be #3 as far as  the application is concerned. 12/20; since the patient was last here she was seen by Dr. Stanford Breed who felt that her Doppler flow was sluggish in the bypass. She therefore went underwent a urgent angiogram on 09/07/2021 by Dr. Carlis Abbott. She had an angioplasty of the distal anastomosis and the anterior tibial artery. She tolerated this well. She had no flow-limiting stenosis in the aortoiliac segment on the left. Some calcification in the left common iliac artery but it  was not flow-limiting the common femoral and profunda were patent. The left lower extremity bypass had brisk flow with no significant proximal anastomosis on the common femoral artery. Distally there was a high-grade stenosis where the bypass anastomosis was performed. This was angioplastied as well as the anterior tibial artery Electronic Signature(s) Signed: 09/12/2021 4:29:35 PM By: [name] MD Entered By: [name] on 09/12/2021 13:52:46 -------------------------------------------------------------------------------- Physical Exam Details Patient Name: Date of Service: [name], [name] 09/12/2021 1:00 PM Medical Record Number: [id number] Patient Account Number: [id number] Date of Birth/Sex: Treating RN: 04-13-1941 (81 y.o. [name] Primary Care Provider: [name] Other Clinician: Referring Provider: Treating Provider/[name]: Tressie Stalker in Treatment: 12 Constitutional Patient is hypertensive.. Pulse regular and within target range for patient.Marland Kitchen Respirations regular, non-labored and within target range.. Temperature is normal and within the target range for the patient.Marland Kitchen Appears in no distress. Cardiovascular Pedal pulses are palpable but reduced at the dorsalis pedis and posterior tibial. Notes Wound exam; the area on the TMA site has contracted. The plantar surface wound I think is fully epithelialized nevertheless I applied Apligraf here as well. Electronic Signature(s) Signed: 09/12/2021 4:29:35 PM By: [name] MD Entered By: [name] on 09/12/2021 13:54:20 -------------------------------------------------------------------------------- Physician Orders Details Patient Name: Date of Service: [name], [place]. 09/12/2021 1:00 PM Medical Record Number: [id number] Patient Account Number: [id number] Date of Birth/Sex: Treating RN: 25-Sep-1940 (81 y.o. [name], [name] Primary Care Provider: [name] Other Clinician: Referring Provider: Treating Provider/[name]: Tressie Stalker in Treatment: 12 Verbal / Phone Orders: No Diagnosis Coding Follow-up Appointments ppointment in 2 weeks. - Dr. Dellia Nims Return A Cellular or Tissue Based Products Cellular or Tissue Based Product Type: - Apligraf#4 Bathing/ Shower/ Hygiene May shower with protection but do not get wound dressing(s) wet. - CANNOT GET DRESSING WET AT ALL!!!! Edema Control - Lymphedema / SCD / Other Elevate legs to the level of the heart or above for 30 minutes daily and/or when sitting, a frequency of: Avoid standing for long periods of time. Home Health New wound care orders this week; continue Home Health for wound care. May utilize formulary equivalent dressing for wound treatment orders unless otherwise specified. - [facility] to change M,W,F DO NOT REMOVE ADAPTIC AND STERISTRIPS Wound Treatment Wound #13 - Amputation Site - Transmetatarsal Wound Laterality: Left Cleanser: Wound Cleanser (Home Health) 2 x Per Week/15 Days Discharge Instructions: Cleanse the wound with wound cleanser prior to applying a clean dressing using gauze sponges, not tissue or cotton balls. Prim Dressing: Apligraf #4 (Home Health) 2 x Per Week/15 Days ary Secondary Dressing: Woven Gauze Sponge, Non-Sterile 4x4 in (Home Health) 2 x Per Week/15 Days Discharge Instructions: Apply over primary dressing as directed. Secondary Dressing: ABD Pad, 5x9 (Home Health) 2 x Per Week/15 Days Discharge Instructions: Apply over primary dressing as directed. Secured With: [employer], 4.5x3.1 (in/yd) (Home Health) 2 x Per Week/15 Days Discharge Instructions: Secure with Kerlix as directed. Secured With: Paper Tape, 2x10 (  in/yd) (Home Health) 2 x Per Week/15 Days Discharge Instructions: Secure dressing with tape as directed. Wound #14 - Calcaneus Wound Laterality: Left, Posterior Cleanser: Wound Cleanser (Home Health) 2  x Per Week/15 Days Discharge Instructions: Cleanse the wound with wound cleanser prior to applying a clean dressing using gauze sponges, not tissue or cotton balls. Prim Dressing: Apligraf#4 (Home Health) 2 x Per Week/15 Days ary Secondary Dressing: Woven Gauze Sponge, Non-Sterile 4x4 in (Home Health) 2 x Per Week/15 Days Discharge Instructions: Apply over primary dressing as directed. Secondary Dressing: ABD Pad, 5x9 (Home Health) 2 x Per Week/15 Days Discharge Instructions: Apply over primary dressing as directed. Secured With: [employer], 4.5x3.1 (in/yd) (Home Health) 2 x Per Week/15 Days Discharge Instructions: Secure with Kerlix as directed. Secured With: Paper Tape, 2x10 (in/yd) (Home Health) 2 x Per Week/15 Days Discharge Instructions: Secure dressing with tape as directed. Electronic Signature(s) Signed: 09/12/2021 4:29:35 PM By: [name] MD Signed: 10/12/2021 12:37:40 PM By: Rhae Hammock RN Entered By: Rhae Hammock on 09/12/2021 13:46:51 -------------------------------------------------------------------------------- Problem List Details Patient Name: Date of Service: [name], [place]. 09/12/2021 1:00 PM Medical Record Number: [id number] Patient Account Number: [id number] Date of Birth/Sex: Treating RN: 06-17-1941 (81 y.o. [name], [name] Primary Care Provider: [name] Other Clinician: Referring Provider: Treating Provider/[name]: Tressie Stalker in Treatment: 12 Active Problems ICD-10 Encounter Code Description Active Date MDM Diagnosis E11.621 Type 2 diabetes mellitus with foot ulcer 06/15/2021 No Yes T81.31XD Disruption of external operation (surgical) wound, not elsewhere classified, 06/15/2021 No Yes subsequent encounter E11.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 06/15/2021 No Yes L97.528 Non-pressure chronic ulcer of other part of left foot with other specified  06/15/2021 No Yes severity L97.428 Non-pressure chronic ulcer of left heel and midfoot with other specified 06/15/2021 No Yes severity Inactive Problems Resolved Problems Electronic Signature(s) Signed: 09/12/2021 4:29:35 PM By: [name] MD Entered By: [name] on 09/12/2021 13:50:33 -------------------------------------------------------------------------------- Progress Note Details Patient Name: Date of Service: [name], [name] 09/12/2021 1:00 PM Medical Record Number: [id number] Patient Account Number: [id number] Date of Birth/Sex: Treating RN: 04/23/1941 (81 y.o. [name] Primary Care Provider: [name] Other Clinician: Referring Provider: Treating Provider/[name]: Tressie Stalker in Treatment: 12 Subjective History of Present Illness (HPI) ADMISSION 07/13/2019 Patient is a 81 year old type II diabetic. She has known PAD. She has been followed by Dr. Jacqualyn Posey of podiatry for blistering areas on her toes dating back to 04/30/2019 which at this was the left fourth toe. By 8/11 she had wounds on the right and left second toes. She underwent arterial studies by Dr. Alvester Chou on 7/31 that showed ABIs on the right of 0.60 TBI of 0.26 on the left ABI of 0.56 and a TBI of 0.25. By 9/10 she had ischemic-looking wounds per Dr. Earleen Newport on the right first, left first second and third. She has been using Medihoney and then mupirocin more recently simply Betadine. The patient underwent angiography by Dr. Gwenlyn Found on 9/21. 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 Noted that there was 1 vessel runoff in the foot via the peroneal. ooOn the left there was an 80% left SFA, 70% mid left SFA. There was a short segment calcified CTO distal less than SFA and above-knee popliteal artery reconstituting with two-vessel runoff. The  posterior tibial artery was  occluded. It was felt that she had bilateral SFA disease as well as tibial vessel disease. An attempt was made to revascularize the left SFA but they were unable to cross because of the highly calcified nature of the lesion. 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 She has an area on the medial left fourth toe with raised horned callus on top of this. I am not certain what this represents. With regards to pain she has about a 2-1/2 I will claudication tolerance in the grocery store. She has some pain in night which is relieved by putting her feet down over the side of the bed. She is wearing Nitro-Dur patches on the top of her right foot. Past medical history includes type 2 diabetes with secondary PAD, neoplasm of the skin, diabetic retinopathy, carotid artery stenosis, hyperlipidemia hypertension. 11/2; the last time the patient was here I spoke to Dr. Gwenlyn Found about revascularization options on the right. As I understand things currently Dr. Gwenlyn Found spoke with Dr. Trula Slade and ultimately the patient was taken to surgery on 10/27. She had a right iliofemoral endarterectomy with a bovine patch angioplasty. I think the plan now is for her to have a staged intervention on the right SFA by Dr. Alvester Chou. Per the patient's understanding Dr. Gwenlyn Found and Dr. Jacqualyn Posey are waiting to see when the gangrenous toes on the right foot can be amputated. The patient states her pain is a lot better and she is grateful for that. 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 Small area on the left fourth toe. She is using Betadine to the mummified areas on the right and Medihoney on the left 08/27/2019; the last time I saw this patient I discharged her from the clinic. She had been revascularized by Dr. Trula Slade and she had a right  iliofemoral endarterectomy with a bovine angioplasty. 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 Jacqualyn Posey of podiatry on 08/07/2019. I note that she also had a intervention by Dr. Gwenlyn Found and he performed a directional atherectomy and drug-coated balloon angioplasty of the SFA and popliteal artery on the right. I am not certain of the date of Dr. Kennon Holter procedure as of the time of this dictation. She was referred back to [place] by Dr. Earleen Newport predominantly for follow-up of the left fourth toe. She still has sutures and stitches in the right TMA site. She states her pain is a lot better. She expresses concern about the condition of the amputation site at the TMA. She is on doxycycline I think prescribed by Dr. Earleen Newport. She is complaining of some pain at night 12/10; I spoke to Dr. Jacqualyn Posey last week. He removed the sutures on the right foot on Monday of this week. She has the area on the left fourth toe just proximal to the PIP and then the right TMA site. She is still on doxycycline and has enough through next week. Unfortunately the TMA site does not look good at all. Both on the lateral and medial part of the incisions are areas that probe to bone. There is purulence over the medial part which I have cultured. We will use silver alginate. Left fourth toe looks somewhat better but there was still exposed bone 12/17; patient has an MRI booked for 12/30. Culture I did last week showed  Pseudomonas Serratia and Enterococcus. This was purulent drainage coming out of the medial part of her amputation site. I use cefdinir 300 twice daily for 10 days that started on 12/15. Her x-ray on the right showed limited evaluation for osteomyelitis. The findings could have been postoperative. There was subtle erosion in the distal first and distal fifth metatarsal. An MRI was suggested. On the left she had irregularity of the fourth middle and proximal phalanx consistent with a  history of osteomyelitis. I do not know that she has a history of osteomyelitis in this area. She had a newly defined area on the plantar third toe 12/31; patient's MRI is listed below: MRI OF THE RIGHT FOREFOOT WITHOUT AND WITH CONTRAST TECHNIQUE: Multiplanar, multisequence MR imaging of the right forefoot was performed before and after the administration of intravenous contrast. CONTRAST 6 mL GADAVIST IV SOLN : COMPARISON: Plain films right foot 09/04/2019. FINDINGS: Bones/Joint/Cartilage The patient is status post transmetatarsal amputation as seen on the prior exam. Marrow edema and enhancement are seen in all of the distal metatarsals. In the first metatarsal, signal change extends 1.5 cm proximal to the stump and in the second metatarsal extends approximately 2 cm proximal to the stump. Edema and enhancement are seen in only the distal 0.7 cm of the third metatarsal stump and tips of the fourth and fifth metatarsals. Bone marrow signal is otherwise unremarkable. A small focus of subchondral edema is seen in the lateral talus, likely degenerative. Ligaments Intact. Muscles and Tendons No intramuscular fluid collection. Soft tissues Skin ulceration is seen off the stump of the first metatarsal. A thin fluid collection tracks deep to the wound and over the anterior metatarsals worrisome for small abscess. Intense subcutaneous edema and enhancement are seen diffusely. 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 Cellulitis about the foot. Skin ulceration over the distal first metatarsal is identified with a thin fluid collection tracking anteriorly along the stump worrisome for abscess. Small focus of subchondral edema in the lateral talus is likely degenerative. Electronically Signed By: Inge Rise M.D. On: 09/23/2019  15:25 Patient arrives in clinic today not complaining any of any pain. 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 She does not describe claudication however her activity is very limited. 10/01/2019; since the patient was last here I have communicated with Dr. Gwenlyn Found after bypass by Dr. Trula Slade and addressing the right superficial femoral artery he states that she is widely patent through peroneal arteries to the ankle with collaterals to the dorsalis pedis. He states he is going to talk to colleagues about the feasibility of tibial pedal access. The patient seems infectious disease later this afternoon Dr. Baxter Flattery. in preparation for this she has been off antibiotics for 1 week and I went ahead and obtain pieces of the remanence of her first metatarsal for pathology and CandS. The patient is a candidate for hyperbaric oxygen. 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 1/15; considerable improvement in the wounds on her feet. We are using silver collagen. She follows with Dr. Baxter Flattery of infectious disease and is on meropenem and daptomycin. She has been taught how to do this herself at home. Follows with Dr. Graylon Good at the end of treatment here. She has 2 wounds on the surgical TMA site 1 lateral and 1 medial the  lateral 1 has regressed tremendously. The area medially still has some exposed bone although the base of this looks healthy. 1/22; 2 separate wounds on the surgical TMA site. Both of these look satisfactory. The medial area does not have any exposed bone. This is an improvement On the left side fourth toe dorsally over the proximal phalanx there is a deep punched out area that probes to bone. She has an area on the tip of the left third toe. She also tells [place] that in HBO she traumatized her left Achilles and this is left her with a superficial wound area 1/28; weekly visit  along with HBO. She has 5 wounds. T our punched out areas on the original TMA site on the right. Both of these appear to have contracted. o The area on the right no longer has exposed bone. She has an area on the tip of the left third toe and on the DIP of the left fourth toe. Both of these had surface callus that I removed and unfortunately they have small areas that both go to bone. She has a traumatic wound on the left Achilles area that happened in HBO and that appears better. She is tolerating her IV antibiotics well at home. She has home health changing the dressings and she is doing it once on the weekends. We have been using silver collagen. She has been extensively revascularized on the right by Dr. Trula Slade and subsequently by Dr. Gwenlyn Found. 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 Baxter Flattery of infectious disease on 2/17. Dr. Gwenlyn Found on 2/23 and according to her her antibiotics stopped on 2/23. She is tolerating hyperbarics well. She has made a tremendous improvement in the right forefoot with only 2 smaller open wounds. Using silver collagen. On the left foot she has the area on the tip of the left third toe and the medial part of the left fourth toe. These had exposed bone last week I did not sense any of that today 2/12; sees Dr. Graylon Good on 2/17 and Dr. Gwenlyn Found on 2/23. We are going to use Dermagraft on this today however the lateral part of her train TMA incision on the right is healed and the medial part is down so much that we just continue with the silver collagen She has wounds on the tip of her left third and the medial aspect of the left fourth. Both of these still have exposed bone I have not been able to get these to epithelialize. 2/19; she sees Dr. Gwenlyn Found on 2/23 and I have communicated with him about the left vascular supply. Looking at her angiogram from  08/03/2019 it looks as though that they could not cross the left CFA. Noted that she had one-vessel runoff bilaterally. She also apparently saw Dr. Graylon Good although I did not look up these results for preparation of this record. 2/26; Dr. Gwenlyn Found is going to do an angiogram next week on Wednesday. I think they are going to try to go at this both retrograde and anterograde to see if anything can be done to revascularize the left lower limb. She continues to make nice progress on the remaining wound on the right medial foot on her TMA site and the toe it wounds on the left are responding nicely as well 3/12; the patient underwent an extensive and complicated revascularization on the left  leg by Dr. Fletcher Anon on 3/3; she had an atherectomy on occluded left popliteal artery; anterior tibial artery followed by a balloon angioplasty. Atherectomy was also performed to the left SFA because there was still significant 50% stenosis in the left popliteal artery they performed an intravascular lithotripsy which improved the residual stenosis to 20%. The same lithotripsy was used to dilate the proximal left SFA. She had a drug-eluting stent placed I believe in the SFA. The patient returned for hyperbarics this week. She has had some eye problems on the right which she tells me are secondary to diabetic retinopathy and she saw her eye doctor. She is really made excellent progress there is no open wounds on the left foot at all. She has one open area medially and her TMA site on the right lateral wound is closed. 3/19; the patient comes in today with the area on the medial transmet on the right improved. Some debris removed from the surface revealed the still open wound. ooUnfortunately she now has the open area on the third toe tip and the medial aspect of the dorsal fourth toe. These are in the same location as her previous wounds. These had actually closed up last week. 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 She is undergone complex revascularizations on the right by Dr. Trula Slade and Dr. Gwenlyn Found and I believe Dr. 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 She completed IVantibiotics as directed by infectious disease and I believe a course of oral antibiotics as well.she underwent a complete 40 treatments of hyperbarics. She comes in today with the vast majority of the right TMA site healed there is a small opening on the most medial aspect. Her left third and left fourth toes appear to have epithelialization although this has fluctuated somewhat. 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 She had a gaping wound on the right transmetatarsal amputation L as well as deep wounds on the left third and left fourth toes. She underwent complex revascularizations on her bilateral lower legs which I described on her last visit. She completed IV antibiotics by infectious disease and 40 treatments of hyperbarics. Her left third and fourth toes are healed. The area on the right transmetatarsal amputation site is also closed today. READMISSION 08/04/2020 [name] is now a 81 year old woman that we had for a complex stay in our clinic from October 2000 14 January 2020. 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 Her current problems with wounds began on October 30 when she developed a spontaneous opening over the right transmet medial aspect over the first metatarsal head. She has been using collagen on this that she had leftover from her last stay in this clinic. She also has had an  open area on the medial right calf from a vein harvest site from her CABG she. She said that this is never really closed since the vein harvest was done. With regards to her arterial status this is a major issue. She had an angiogram by Dr. Gwenlyn Found in September 2020. She developed a  gangrenous right first and second toes. Ultimately she was referred to Dr. Trula Slade who did a right common femoral endarterectomy with patch angioplasty. This was on 07/21/2019 and she really had a good result. Dr. Gwenlyn Found did a atherectomy followed by a drug coated balloon angioplasty of her right SFA popliteal and tibial peroneal trunk on 08/03/2019 again with excellent excellent results. Follow-up.Dopplers in November revealed revealed a widely patent SFA and tibioperoneal trunk. UNFORTUNATELY her recent angiogram that was done on 08/01/2020 showed an 80% right common femoral artery stenosis just above the previous endarterectomy site occluded right right SFA at the origin reconstituting the adductor canal by the profunda femoris collaterals. The profunda femoris is also diffusely diseased. There is and again another 80% tibioperoneal trunk stenosis and one-vessel runoff via the peroneal artery. She is seen Dr. Donzetta Matters and she is scheduled for a femoral endarterectomy and right femoral-popliteal bypass grafting with endarterectomy of her tibial peroneal trunk. The scheduled surgery is on 11/23 The patient does not complain of pain at rest. She does have 5-minute walking claudication. The wounds on her right first met head remanent was spontaneous she does not think she hit this. As mentioned in the area on her right medial calf is a vein harvest site Dr. Gwenlyn Found had texted me about getting her in the clinic and we have arranged this as soon as we can. I have told her that we will have to see how successful Dr. Donzetta Matters is before we know what can be done about the wounds in a precise fashion. Fortunately there does not seem to be any  obvious infection involving the right foot although that may need to be looked at if things really deteriorate. She was treated with IV antibiotics and hyperbaric oxygen for her previous osteomyelitis 12/2; the patient underwent a right femoral endarterectomy and bypass femoral to popliteal artery. This was done on 08/16/2020. She is done remarkably well. She has a small area on the right anterior tibia and a small area in the medial part of her original right transmetatarsal amputation. She appears to have had an excellent response to surgery. 12/16; the patient's area on the right tibia and the small area on the medial part of her original right transmetatarsal amputation is totally healed READMISSION 06/15/2021 [name] is a now 81 year old woman who arrives for review of wounds on the left TMA site extending into her plantar foot as well as an area on the left posterior calcaneus. 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 She was extensively revascularized on the right at that time as noted above in our previous notes ended up receiving IV antibiotics and hyperbaric oxygen therapy. Miraculously this TMA site actually closed and she really has had not any trouble with this since. UNFORTUNATELY the same cannot be said of her left side. Her problem apparently started sometime in July when she hit her left third toe on a barstool while playing pool. She developed an acute infection which eventually led to underlying osteomyelitis. She underwent an extensive hospitalization from 04/27/2021 through 05/15/2021. She received IV antibiotics. Also extensive intervention by Dr. Stanford Breed of vein and vascular which included a left common femoral to tibioperoneal trunk bypass as well as a left third toe amputation on 05/03/2021. Unfortunately the area did not heal she ended up requiring a left TMA. By her description this  was left open. She has been using a wound  VAC on this to earlier this week and then was switched to [facility]. I think because of the left forefoot off loader she is also developed an area on the left posterior calcaneus. She is not complaining of a lot of pain. She has her daughter at home to change the dressings. She has been prescribed Santyl and she has a tube that she is brought into our clinic. I cannot get a lot of history of claudication although she is really not that mobile at this point walking with a walker. 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 She has had multiple interventions on the right side by vein and vascular and also by Dr. Gwenlyn Found as well as a transmetatarsal amputation on the right. She has had follow-up noninvasive studies on 06/06/2021; on the left that showed an ABI of 0.47 with monophasic waveforms a dorsalis pedis ABI of 0.40 with monophasic waveforms. Her ANGIOGRAM which was initially done by Dr. Oneida Alar on 04/28/2021 showed severe left foot superficial femoral artery occlusive disease and an occlusion of the left popliteal artery with two-vessel runoff to the left foot with moderate to severe disease below the knee popliteal artery and tibial disease. She had 80% distal right external iliac artery above the existing femoral above-knee popliteal bypass but she does not have an open wound on the right foot She arrives in clinic with an extensive open area on the TMA site which in the mid aspect extends into the plantar foot. She also has a more superficial area on the upper part of her Achilles area of the left heel 9/29; patient arrives in clinic today with slough over the TMA site. We use Molecu[employer] to look at the surface of this suggesting cyan and white discoloration over a large part of this wound also extending into the third  metatarsal area. She also has the area on the left posterior calcaneus. This is slough covered we will switch to [facility] here as well. There is some warmth in the area and some erythema we will give her antibiotics as well 10/6; completely necrotic surface once again on the left TMA site. We have been using Santyl and Hydrofera Blue. She has an area on the left posterior Achilles and actually states this has been more painful this week indeed there is some erythema around this area. She also describes I think claudication at rest which is relieved by dipping her left leg over the side of the bed at night. 10/13; our intake nurse was able to brush some slough off the surface of the left TMA hence this did not require debridement that is better than last week. The area heading towards the plantar part of her foot required an aggressive debridement to clean this up. This is quite a sizable divot but does not go to bone. The area on the right Achilles heel is also covered in a high 100% nonviable tissue we have been using Santyl and silver alginate 10/20; the area on the left TMA looks much better. Even the area is spreading into the midfoot looks improved. The right Achilles still is covered and nonviable surface 100%. We have been using Santyl and silver alginate and this will continue for this week The patient is approved for 5 Apligraf's and I think she is good enough to start using these next week 10/27; the patient's wounds actually look quite  good. We have been using Santyl and silver alginate. We applied Apligraf #1 today Saw Dr. Stanford Breed her vascular surgeon on 10/25 he told her TMA was healing nicely. She will follow-up with them in December to repeat noninvasive testing. 08/16/2021 upon evaluation today patient appears to be doing much better in regard to her wounds. Both are showing signs of improvement which is great news. I am going to perform some debridement to clear away some of the necrotic  debris currently this will just be a light debridement. Nonetheless other than that I feel like that the Apligraf is doing quite well working and reapply today and this will be #3 as far as the application is concerned. 12/20; since the patient was last here she was seen by Dr. Stanford Breed who felt that her Doppler flow was sluggish in the bypass. She therefore went underwent a urgent angiogram on 09/07/2021 by Dr. Carlis Abbott. She had an angioplasty of the distal anastomosis and the anterior tibial artery. She tolerated this well. She had no flow-limiting stenosis in the aortoiliac segment on the left. Some calcification in the left common iliac artery but it was not flow-limiting the common femoral and profunda were patent. The left lower extremity bypass had brisk flow with no significant proximal anastomosis on the common femoral artery. Distally there was a high-grade stenosis where the bypass anastomosis was performed. This was angioplastied as well as the anterior tibial artery Objective Constitutional Patient is hypertensive.. Pulse regular and within target range for patient.Marland Kitchen Respirations regular, non-labored and within target range.. Temperature is normal and within the target range for the patient.Marland Kitchen Appears in no distress. Vitals Time Taken: 12:58 PM, Height: 64 in, Weight: 135 lbs, BMI: 23.2, Temperature: 97.7 F, Pulse: 74 bpm, Respiratory Rate: 17 breaths/min, Blood Pressure: 173/74 mmHg, Capillary Blood Glucose: 110 mg/dl. Cardiovascular Pedal pulses are palpable but reduced at the dorsalis pedis and posterior tibial. General Notes: Wound exam; the area on the TMA site has contracted. The plantar surface wound I think is fully epithelialized nevertheless I applied Apligraf here as well. Integumentary (Hair, Skin) Wound #13 status is Open. Original cause of wound was Surgical Injury. The date acquired was: 05/11/2021. The wound has been in treatment 12 weeks. The wound is located on the Left  Amputation Site - Transmetatarsal. The wound measures 0.7cm length x 2cm width x 0.2cm depth; 1.1cm^2 area and 0.22cm^3 volume. There is Fat Layer (Subcutaneous Tissue) exposed. There is no tunneling or undermining noted. There is a medium amount of serosanguineous drainage noted. The wound margin is distinct with the outline attached to the wound base. There is large (67-100%) red granulation within the wound bed. There is no necrotic tissue within the wound bed. Wound #14 status is Open. Original cause of wound was Pressure Injury. The date acquired was: 05/11/2021. The wound has been in treatment 12 weeks. The wound is located on the Left,Posterior Calcaneus. The wound measures 0.5cm length x 1cm width x 0.2cm depth; 0.393cm^2 area and 0.079cm^3 volume. There is Fat Layer (Subcutaneous Tissue) exposed. There is no tunneling or undermining noted. There is a medium amount of serous drainage noted. The wound margin is flat and intact. There is medium (34-66%) red granulation within the wound bed. There is a medium (34-66%) amount of necrotic tissue within the wound bed. 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 A skin graft procedure using a bioengineered skin substitute/cellular or tissue based product was performed by Ricard Dillon., MD with the following instrument(s): Forceps and Scissors. Apligraf was applied and secured with Steri-Strips. 44 sq cm of product  was utilized and 0 sq cm was wasted. Post Application, AQDAPTIC was applied. The procedure was tolerated well with a pain level of 0 throughout and a pain level of 0 following the procedure. Post procedure Diagnosis Wound #13: Same as Pre-Procedure . Plan Follow-up Appointments: Return Appointment in 2 weeks. - Dr. Dellia Nims Cellular or Tissue Based Products: Cellular or Tissue Based Product Type: - Apligraf#4 Bathing/ Shower/ Hygiene: May shower with protection but do not get wound dressing(s) wet. - CANNOT GET DRESSING WET AT ALL!!!! Edema Control - Lymphedema / SCD / Other: Elevate legs to the level of the heart or above for 30 minutes daily and/or when sitting, a frequency of: Avoid standing for long periods of time. Home Health: New wound care orders this week; continue Home Health for wound care. May utilize formulary equivalent dressing for wound treatment orders unless otherwise specified. - [facility] to change M,W,F DO NOT REMOVE ADAPTIC AND STERISTRIPS WOUND #13: - Amputation Site - Transmetatarsal Wound Laterality: Left Cleanser: Wound Cleanser (Home Health) 2 x Per Week/15 Days Discharge Instructions: Cleanse the wound with wound cleanser prior to applying a clean dressing using gauze sponges, not tissue or cotton balls. Prim Dressing: Apligraf #4 (Home Health) 2 x Per Week/15 Days ary Secondary Dressing: Woven Gauze Sponge, Non-Sterile 4x4 in (Home Health) 2 x Per Week/15 Days Discharge Instructions: Apply over primary dressing as directed. Secondary Dressing: ABD Pad, 5x9 (Home Health) 2 x Per Week/15 Days Discharge Instructions: Apply over primary dressing as directed. Secured With: [employer], 4.5x3.1 (in/yd) (Home Health) 2 x Per Week/15 Days Discharge Instructions: Secure with Kerlix as directed. Secured With: Paper T ape, 2x10 (in/yd) (Home Health) 2 x Per Week/15 Days Discharge Instructions: Secure dressing with tape as directed. WOUND #14: - Calcaneus Wound  Laterality: Left, Posterior Cleanser: Wound Cleanser (Home Health) 2 x Per Week/15 Days Discharge Instructions: Cleanse the wound with wound cleanser prior to applying a clean dressing using gauze sponges, not tissue or cotton balls. Prim Dressing: Apligraf#4 (Home Health) 2 x Per Week/15 Days ary Secondary Dressing: Woven Gauze Sponge, Non-Sterile 4x4 in (Home Health) 2 x Per Week/15 Days Discharge Instructions: Apply over primary dressing as directed. Secondary Dressing: ABD Pad, 5x9 (Home Health) 2 x Per Week/15 Days Discharge Instructions: Apply over primary dressing as directed. Secured With: [employer], 4.5x3.1 (in/yd) (Home Health) 2 x Per Week/15 Days Discharge Instructions: Secure with Kerlix as directed. Secured With: Paper T ape, 2x10 (in/yd) (Home Health) 2 x Per Week/15 Days Discharge Instructions: Secure dressing with tape as directed. 1. Apligraf #4 applied to the TMA site. The remanent of the surgical wound on the plantar foot as well as the area on her Achilles heel. All of this on the left Electronic Signature(s) Signed: 09/12/2021 4:29:35 PM By: [name] MD Entered By: [name] on 09/12/2021 13:55:10 -------------------------------------------------------------------------------- SuperBill Details Patient Name: Date of Service: [name], [name] 09/12/2021 Medical Record Number: [id number] Patient Account Number: [id number] Date  of Birth/Sex: Treating RN: 1941-02-24 (81 y.o. 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 Facility Procedures CPT4 Code: [id number] Description: (Facility Use Only) Apligraf 1 SQ CM Modifier: Quantity: [place] Code: [id number] Description: [id number] - Skin Sub 1ST 25 SQCM SM AREA H/F/T/F/S ICD-10 Diagnosis Description L97.528 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it Modifier: y Quantity: 1 Physician Procedures : CPT4 Code Description Modifier [id number] - Skin Sub 1ST 25 SQCM SM AREA H/F/T/F/S ICD-10 Diagnosis Description L97.528 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ity Quantity: 1 Electronic Signature(s) Signed: 09/12/2021 4:29:35 PM By: [name] MD Signed: 10/12/2021 12:37:40 PM By: Rhae Hammock RN Entered By: Rhae Hammock on 09/12/2021 14:11:49

## 2021-10-12 NOTE — Progress Notes (Signed)
LAMEES, [name] ([id number]) Visit Report for 09/12/2021 Arrival Information Details Patient Name: Date of Service: [name] 09/12/2021 1:00 PM Medical Record Number: [id number] Patient Account Number: [id number] Date of Birth/Sex: Treating RN: [name] (81 y.o. [name], [name] Primary Care [name]: [name] Other Clinician: Referring [name]: Treating [name]: [name] in Treatment: 12 Visit Information History Since Last Visit Added or deleted any medications: No Patient Arrived: [name] Any new allergies or adverse reactions: No Arrival Time: 12:56 Had a fall or experienced change in No Accompanied By: self activities of daily living that [name] affect Transfer Assistance: None risk of falls: Patient Identification Verified: Yes Signs or symptoms of abuse/neglect since last visito No Secondary Verification Process Completed: Yes Hospitalized since last visit: No Patient Requires Transmission-Based Precautions: No Implantable device outside of the clinic excluding No Patient Has Alerts: Yes cellular tissue based products placed in the center Patient Alerts: Patient on Blood Thinner since last visit: L ABI: 0.82 Has Dressing in Place as Prescribed: Yes R ABI: 0.47 Pain Present Now: No Electronic Signature(s) Signed: 10/12/2021 12:37:40 PM By: [name] Hammock RN Entered By: [name] on 09/12/2021 12:58:55 -------------------------------------------------------------------------------- Encounter Discharge Information Details Patient Name: Date of Service: [name], [name]. 09/12/2021 1:00 PM Medical Record Number: [id number] Patient Account Number: [id number] Date of Birth/Sex: Treating RN: [name] (81 y.o. [name], [name] Primary Care Melanee Cordial: [name] Other Clinician: Referring [name]: Treating Danial Hlavac/[name]: [name] in Treatment:  12 Encounter Discharge Information Items Post Procedure Vitals Discharge Condition: Stable Temperature (F): 97.4 Ambulatory Status: Ambulatory Pulse (bpm): 74 Discharge Destination: Home Respiratory Rate (breaths/min): 17 Transportation: Private Auto Blood Pressure (mmHg): 134/73 Accompanied By: self Schedule Follow-up Appointment: Yes Clinical Summary of Care: Patient Declined Electronic Signature(s) Signed: 10/12/2021 12:37:40 PM By: [name] Hammock RN Entered By: [name] on 09/12/2021 14:13:04 -------------------------------------------------------------------------------- Lower Extremity Assessment Details Patient Name: Date of Service: [name], [name] 09/12/2021 1:00 PM Medical Record Number: [id number] Patient Account Number: [id number] Date of Birth/Sex: Treating RN: [name] (81 y.o. [name], [name] Primary Care Koraline Phillipson: [name] Other Clinician: Referring Alaijah Gibler: Treating [name]/[name]: [name] in Treatment: 12 Edema Assessment Assessed: Shirlyn Goltz: Yes] Patrice Paradise: No] Edema: [Left: N] [Right: o] Calf Left: Right: Point of Measurement: From Medial Instep 33.5 cm Ankle Left: Right: Point of Measurement: From Medial Instep 20 cm Vascular Assessment Pulses: Dorsalis Pedis Palpable: [Left:Yes] Posterior Tibial Palpable: [Left:Yes] Electronic Signature(s) Signed: 10/12/2021 12:37:40 PM By: [name] Hammock RN Entered By: [name] on 09/12/2021 13:01:10 -------------------------------------------------------------------------------- Multi Wound Chart Details Patient Name: Date of Service: [name], [name] 09/12/2021 1:00 PM Medical Record Number: [id number] Patient Account Number: [id number] Date of Birth/Sex: Treating RN: [name] (81 y.o. [name], [name] Primary Care [name]: [name] Other Clinician: Referring [name]: Treating [name]/[name]: [name] in Treatment: 12 Vital Signs Height(in): 64 Capillary Blood Glucose(mg/dl): 110 Weight(lbs): 135 Pulse(bpm): 58 Body Mass Index(BMI): 23 Blood Pressure(mmHg): 173/74 Temperature(F): 97.7 Respiratory Rate(breaths/min): 17 Photos: [13:Left Amputation Site - Transmetatarsal Left, Posterior Calcaneus] [N/A:N/A N/A] Wound Location: [13:Surgical Injury] [14:Pressure Injury] [N/A:N/A] Wounding Event: [13:Diabetic Wound/Ulcer of the Lower] [14:Diabetic Wound/Ulcer of the Lower] [N/A:N/A] Primary Etiology: [13:Extremity Open Surgical Wound] [14:Extremity N/A] [N/A:N/A] Secondary Etiology: [13:Coronary Artery Disease, Deep Vein Coronary Artery Disease, Deep Vein N/A] Comorbid History: [13:Thrombosis, Hypertension, Myocardial Thrombosis, Hypertension, Myocardial Infarction, Peripheral Arterial Disease, Infarction, Peripheral Arterial Disease, Type II Diabetes 05/11/2021] [14:Type II Diabetes 05/11/2021] [N/A:N/A] Date  Acquired: [13:12] [14:12] [N/A:N/A] Weeks of Treatment: [13:Open] [14:Open] [N/A:N/A] Wound Status: [13:0.7x2x0.2] [14:0.5x1x0.2] [N/A:N/A] Measurements L x W x D (cm) [13:1.1] [14:0.393] [N/A:N/A] A (cm) : rea [13:0.22] [28:7.867] [N/A:N/A] Volume (cm) : [13:97.10%] [14:71.60%] [N/A:N/A] % Reduction in A rea: [13:99.40%] [14:42.80%] [N/A:N/A] % Reduction in Volume: [13:Grade 2] [14:Grade 2] [N/A:N/A] Classification: [13:Medium] [14:Medium] [N/A:N/A] Exudate A mount: [13:Serosanguineous] [14:Serous] [N/A:N/A] Exudate Type: [13:red, brown] [14:amber] [N/A:N/A] Exudate Color: [13:Distinct, outline attached] [14:Flat and Intact] [N/A:N/A] Wound Margin: [13:Large (67-100%)] [14:Medium (34-66%)] [N/A:N/A] Granulation A mount: [13:Red] [14:Red] [N/A:N/A] Granulation Quality: [13:None Present (0%)] [14:Medium (34-66%)] [N/A:N/A] Necrotic A mount: [13:Fat Layer (Subcutaneous Tissue): Yes Fat Layer (Subcutaneous Tissue): Yes N/A] Exposed  Structures: [13:Fascia: No Tendon: No Muscle: No Joint: No Bone: No Medium (34-66%)] [14:Fascia: No Tendon: No Muscle: No Joint: No Bone: No Small (1-33%)] [N/A:N/A] Epithelialization: [13:Cellular or Tissue Based Product] [14:N/A] [N/A:N/A] Treatment Notes Electronic Signature(s) Signed: 09/12/2021 4:29:35 PM By: [name] MD Signed: 09/12/2021 5:06:43 PM By: Deon Pilling RN, BSN Entered By: [name] on 09/12/2021 13:50:46 -------------------------------------------------------------------------------- Multi-Disciplinary Care Plan Details Patient Name: Date of Service: [name], [name] 09/12/2021 1:00 PM Medical Record Number: [id number] Patient Account Number: [id number] Date of Birth/Sex: Treating RN: [name] (81 y.o. [name], [name] Primary Care Esequiel Kleinfelter: [name] Other Clinician: Referring Addilee Neu: Treating Savana Spina/[name]: [name] in Treatment: Jack reviewed with physician Active Inactive Nutrition Nursing Diagnoses: Impaired glucose control: actual or potential Potential for alteratiion in Nutrition/Potential for imbalanced nutrition Goals: Patient/caregiver agrees to and verbalizes understanding of need to use nutritional supplements and/or vitamins as prescribed Date Initiated: 06/15/2021 Target Resolution Date: 09/23/2021 Goal Status: Active Patient/caregiver will maintain therapeutic glucose control Date Initiated: 06/15/2021 Target Resolution Date: 09/23/2021 Goal Status: Active Interventions: Assess HgA1c results as ordered upon admission and as needed Assess patient nutrition upon admission and as needed per policy Provide education on elevated blood sugars and impact on wound healing Provide education on nutrition Treatment Activities: Education provided on Nutrition : 06/15/2021 Notes: Wound/Skin Impairment Nursing Diagnoses: Impaired tissue integrity Knowledge  deficit related to ulceration/compromised skin integrity Goals: Patient/caregiver will verbalize understanding of skin care regimen Date Initiated: 06/15/2021 Target Resolution Date: 09/23/2021 Goal Status: Active Interventions: Assess patient/caregiver ability to obtain necessary supplies Assess patient/caregiver ability to perform ulcer/skin care regimen upon admission and as needed Assess ulceration(s) every visit Provide education on ulcer and skin care Notes: Electronic Signature(s) Signed: 10/12/2021 12:37:40 PM By: [name] Hammock RN Entered By: [name] on 09/12/2021 13:47:19 -------------------------------------------------------------------------------- Pain Assessment Details Patient Name: Date of Service: [name], [name] 09/12/2021 1:00 PM Medical Record Number: [id number] Patient Account Number: [id number] Date of Birth/Sex: Treating RN: [birth year] (81 y.o. [name], [name] Primary Care Lizbet Cirrincione: [name] Other Clinician: Referring Swayzee Wadley: Treating Lorea Kupfer/[name]: [name] in Treatment: 12 Active Problems Location of Pain Severity and Description of Pain Patient Has Paino No Site Locations Pain Management and Medication Current Pain Management: Electronic Signature(s) Signed: 10/12/2021 12:37:40 PM By: [name] Hammock RN Entered By: [name] on 09/12/2021 13:00:59 -------------------------------------------------------------------------------- Patient/Caregiver Education Details Patient Name: Date of Service: [name], [name] 12/20/2022andnbsp1:00 PM Medical Record Number: [id number] Patient Account Number: [id number] Date of Birth/Gender: Treating RN: [birth year] [name] (81 y.o. Benjaman Lobe Primary Care Physician: [name] Other Clinician: Referring Physician: Treating Physician/[name]: [name] in Treatment: 12 Education  Assessment Education Provided To: Patient Education Topics Provided Basic Hygiene: Methods: Explain/Verbal Responses: State content correctly Electronic  Signature(s) Signed: 10/12/2021 12:37:40 PM By: [name] Hammock RN Entered By: [name] on 09/12/2021 14:01:18 -------------------------------------------------------------------------------- Wound Assessment Details Patient Name: Date of Service: [name], [name] 09/12/2021 1:00 PM Medical Record Number: [id number] Patient Account Number: [id number] Date of Birth/Sex: Treating RN: [birth year] [name] (82 y.o. [name], [name] Primary Care Onelia Cadmus: [name] Other Clinician: Referring Topher Buenaventura: Treating Tiari Andringa/[name]: [name] in Treatment: 12 Wound Status Wound Number: 13 Primary Diabetic Wound/Ulcer of the Lower Extremity Etiology: Wound Location: Left Amputation Site - Transmetatarsal Secondary Open Surgical Wound Wounding Event: Surgical Injury Etiology: Date Acquired: 05/11/2021 Wound Open Weeks Of Treatment: 12 Status: Clustered Wound: No Comorbid Coronary Artery Disease, Deep Vein Thrombosis, Hypertension, History: Myocardial Infarction, Peripheral Arterial Disease, Type II Diabetes Photos Wound Measurements Length: (cm) 0.7 Width: (cm) 2 Depth: (cm) 0.2 Area: (cm) 1.1 Volume: (cm) 0.22 % Reduction in Area: 97.1% % Reduction in Volume: 99.4% Epithelialization: Medium (34-66%) Tunneling: No Undermining: No Wound Description Classification: Grade 2 Wound Margin: Distinct, outline attached Exudate Amount: Medium Exudate Type: Serosanguineous Exudate Color: red, brown Foul Odor After Cleansing: No Slough/Fibrino Yes Wound Bed Granulation Amount: Large (67-100%) Exposed Structure Granulation Quality: Red Fascia Exposed: No Necrotic Amount: None Present (0%) Fat Layer (Subcutaneous Tissue) Exposed: Yes Tendon Exposed: No Muscle Exposed: No Joint  Exposed: No Bone Exposed: No Electronic Signature(s) Signed: 10/12/2021 12:37:40 PM By: [name] Hammock RN Entered By: [name] on 09/12/2021 13:18:16 -------------------------------------------------------------------------------- Wound Assessment Details Patient Name: Date of Service: [name], [name] 09/12/2021 1:00 PM Medical Record Number: [id number] Patient Account Number: [id number] Date of Birth/Sex: Treating RN: [name] (81 y.o. [name], [name] Primary Care Wynonia Medero: [name] Other Clinician: Referring Petrona Wyeth: Treating Theo Reither/[name]: [name] in Treatment: 12 Wound Status Wound Number: 14 Primary Diabetic Wound/Ulcer of the Lower Extremity Etiology: Wound Location: Left, Posterior Calcaneus Wound Open Wounding Event: Pressure Injury Status: Date Acquired: 05/11/2021 Comorbid Coronary Artery Disease, Deep Vein Thrombosis, Hypertension, Weeks Of Treatment: 12 History: Myocardial Infarction, Peripheral Arterial Disease, Type II Diabetes Clustered Wound: No Photos Wound Measurements Length: (cm) 0.5 Width: (cm) 1 Depth: (cm) 0.2 Area: (cm) 0.393 Volume: (cm) 0.079 % Reduction in Area: 71.6% % Reduction in Volume: 42.8% Epithelialization: Small (1-33%) Tunneling: No Undermining: No Wound Description Classification: Grade 2 Wound Margin: Flat and Intact Exudate Amount: Medium Exudate Type: Serous Exudate Color: amber Foul Odor After Cleansing: No Slough/Fibrino Yes Wound Bed Granulation Amount: Medium (34-66%) Exposed Structure Granulation Quality: Red Fascia Exposed: No Necrotic Amount: Medium (34-66%) Fat Layer (Subcutaneous Tissue) Exposed: Yes Tendon Exposed: No Muscle Exposed: No Joint Exposed: No Bone Exposed: No Electronic Signature(s) Signed: 10/12/2021 12:37:40 PM By: [name] Hammock RN Entered By: [name] on 09/12/2021  13:18:43 -------------------------------------------------------------------------------- Vitals Details Patient Name: Date of Service: [name], [name] 09/12/2021 1:00 PM Medical Record Number: [id number] Patient Account Number: [id number] Date of Birth/Sex: Treating RN: [name] (81 y.o. [name], [name] Primary Care Rainn Bullinger: [name] Other Clinician: Referring [place] Warriner: Treating Myrian Botello/[name]: [name] in Treatment: 12 Vital Signs Time Taken: 12:58 Temperature (F): 97.7 Height (in): 64 Pulse (bpm): 74 Weight (lbs): 135 Respiratory Rate (breaths/min): 17 Body Mass Index (BMI): 23.2 Blood Pressure (mmHg): 173/74 Capillary Blood Glucose (mg/dl): 110 Reference Range: 80 - 120 mg / dl Electronic Signature(s) Signed: 10/12/2021 12:37:40 PM By: [name] Hammock RN Entered By: [name] on 09/12/2021 13:00:49

## 2021-10-13 ENCOUNTER — Observation Stay: Payer: Self-pay

## 2021-10-13 ENCOUNTER — Observation Stay (HOSPITAL_COMMUNITY): Payer: Medicare Other

## 2021-10-13 ENCOUNTER — Ambulatory Visit (HOSPITAL_COMMUNITY)
Admission: RE | Disposition: A | Discharge status: Home / Self Care | Payer: Self-pay | Source: Home / Self Care | Attending: Vascular Surgery

## 2021-10-13 ENCOUNTER — Other Ambulatory Visit: Payer: Self-pay

## 2021-10-13 ENCOUNTER — Telehealth: Payer: Self-pay

## 2021-10-13 ENCOUNTER — Observation Stay (HOSPITAL_COMMUNITY)
Admission: RE | Admit: 2021-10-13 | Discharge: 2021-10-14 | Disposition: A | Discharge status: Home / Self Care | Payer: Medicare Other | Attending: Vascular Surgery | Admitting: Vascular Surgery

## 2021-10-13 DIAGNOSIS — R509 Fever, unspecified: Secondary | ICD-10-CM | POA: Diagnosis not present

## 2021-10-13 DIAGNOSIS — E11621 Type 2 diabetes mellitus with foot ulcer: Secondary | ICD-10-CM | POA: Diagnosis not present

## 2021-10-13 DIAGNOSIS — L89620 Pressure ulcer of left heel, unstageable: Secondary | ICD-10-CM | POA: Diagnosis not present

## 2021-10-13 DIAGNOSIS — Z7901 Long term (current) use of anticoagulants: Secondary | ICD-10-CM | POA: Diagnosis not present

## 2021-10-13 DIAGNOSIS — Z87891 Personal history of nicotine dependence: Secondary | ICD-10-CM | POA: Insufficient documentation

## 2021-10-13 DIAGNOSIS — Z7982 Long term (current) use of aspirin: Secondary | ICD-10-CM | POA: Insufficient documentation

## 2021-10-13 DIAGNOSIS — M869 Osteomyelitis, unspecified: Secondary | ICD-10-CM

## 2021-10-13 DIAGNOSIS — Z89412 Acquired absence of left great toe: Secondary | ICD-10-CM | POA: Diagnosis not present

## 2021-10-13 DIAGNOSIS — Z7984 Long term (current) use of oral hypoglycemic drugs: Secondary | ICD-10-CM | POA: Insufficient documentation

## 2021-10-13 DIAGNOSIS — I70235 Atherosclerosis of native arteries of right leg with ulceration of other part of foot: Secondary | ICD-10-CM | POA: Diagnosis not present

## 2021-10-13 DIAGNOSIS — Z79899 Other long term (current) drug therapy: Secondary | ICD-10-CM | POA: Diagnosis not present

## 2021-10-13 DIAGNOSIS — K219 Gastro-esophageal reflux disease without esophagitis: Secondary | ICD-10-CM | POA: Diagnosis not present

## 2021-10-13 DIAGNOSIS — Z89432 Acquired absence of left foot: Secondary | ICD-10-CM | POA: Diagnosis not present

## 2021-10-13 DIAGNOSIS — I70238 Atherosclerosis of native arteries of right leg with ulceration of other part of lower right leg: Secondary | ICD-10-CM | POA: Insufficient documentation

## 2021-10-13 DIAGNOSIS — I10 Essential (primary) hypertension: Secondary | ICD-10-CM | POA: Insufficient documentation

## 2021-10-13 DIAGNOSIS — I70535 Atherosclerosis of nonautologous biological bypass graft(s) of the right leg with ulceration of other part of foot: Secondary | ICD-10-CM | POA: Diagnosis not present

## 2021-10-13 DIAGNOSIS — M7731 Calcaneal spur, right foot: Secondary | ICD-10-CM | POA: Diagnosis not present

## 2021-10-13 DIAGNOSIS — Z4781 Encounter for orthopedic aftercare following surgical amputation: Secondary | ICD-10-CM | POA: Diagnosis not present

## 2021-10-13 DIAGNOSIS — I70202 Unspecified atherosclerosis of native arteries of extremities, left leg: Secondary | ICD-10-CM | POA: Diagnosis not present

## 2021-10-13 DIAGNOSIS — R262 Difficulty in walking, not elsewhere classified: Secondary | ICD-10-CM | POA: Diagnosis not present

## 2021-10-13 DIAGNOSIS — H409 Unspecified glaucoma: Secondary | ICD-10-CM | POA: Diagnosis not present

## 2021-10-13 DIAGNOSIS — E785 Hyperlipidemia, unspecified: Secondary | ICD-10-CM | POA: Diagnosis not present

## 2021-10-13 DIAGNOSIS — Z20822 Contact with and (suspected) exposure to covid-19: Secondary | ICD-10-CM | POA: Diagnosis not present

## 2021-10-13 DIAGNOSIS — L97519 Non-pressure chronic ulcer of other part of right foot with unspecified severity: Secondary | ICD-10-CM | POA: Diagnosis not present

## 2021-10-13 DIAGNOSIS — U071 COVID-19: Secondary | ICD-10-CM | POA: Diagnosis not present

## 2021-10-13 DIAGNOSIS — I739 Peripheral vascular disease, unspecified: Secondary | ICD-10-CM

## 2021-10-13 DIAGNOSIS — Z9889 Other specified postprocedural states: Secondary | ICD-10-CM | POA: Diagnosis present

## 2021-10-13 DIAGNOSIS — Z89431 Acquired absence of right foot: Secondary | ICD-10-CM | POA: Diagnosis not present

## 2021-10-13 DIAGNOSIS — M6281 Muscle weakness (generalized): Secondary | ICD-10-CM | POA: Diagnosis not present

## 2021-10-13 DIAGNOSIS — Z89422 Acquired absence of other left toe(s): Secondary | ICD-10-CM | POA: Diagnosis not present

## 2021-10-13 DIAGNOSIS — E11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: Secondary | ICD-10-CM | POA: Diagnosis not present

## 2021-10-13 DIAGNOSIS — M86171 Other acute osteomyelitis, right ankle and foot: Secondary | ICD-10-CM | POA: Diagnosis not present

## 2021-10-13 DIAGNOSIS — E11628 Type 2 diabetes mellitus with other skin complications: Secondary | ICD-10-CM | POA: Diagnosis not present

## 2021-10-13 DIAGNOSIS — T8789 Other complications of amputation stump: Secondary | ICD-10-CM | POA: Diagnosis not present

## 2021-10-13 DIAGNOSIS — D638 Anemia in other chronic diseases classified elsewhere: Secondary | ICD-10-CM | POA: Diagnosis not present

## 2021-10-13 HISTORY — PX: ABDOMINAL AORTOGRAM W/LOWER EXTREMITY: CATH118223

## 2021-10-13 HISTORY — PX: PERIPHERAL VASCULAR BALLOON ANGIOPLASTY: CATH118281

## 2021-10-13 LAB — BASIC METABOLIC PANEL
BUN/Creatinine Ratio: 27 (calc) — ABNORMAL HIGH (ref 6–22)
BUN: 26 mg/dL — ABNORMAL HIGH (ref 7–25)
CO2: 28 mmol/L (ref 20–32)
Calcium: 10.2 mg/dL (ref 8.6–10.4)
Chloride: 106 mmol/L (ref 98–110)
Creat: 0.96 mg/dL (ref 0.60–1.00)
Glucose, Bld: 248 mg/dL — ABNORMAL HIGH (ref 65–99)
Potassium: 4.1 mmol/L (ref 3.5–5.3)
Sodium: 143 mmol/L (ref 135–146)

## 2021-10-13 LAB — CBC WITH DIFFERENTIAL/PLATELET
Absolute Monocytes: 662 cells/uL (ref 200–950)
Basophils Absolute: 46 cells/uL (ref 0–200)
Basophils Relative: 0.5 %
Eosinophils Absolute: 193 cells/uL (ref 15–500)
Eosinophils Relative: 2.1 %
HCT: 36.1 % (ref 35.0–45.0)
Hemoglobin: 11.8 g/dL (ref 11.7–15.5)
Lymphs Abs: 1803 cells/uL (ref 850–3900)
MCH: 29.4 pg (ref 27.0–33.0)
MCHC: 32.7 g/dL (ref 32.0–36.0)
MCV: 90 fL (ref 80.0–100.0)
MPV: 13 fL — ABNORMAL HIGH (ref 7.5–12.5)
Monocytes Relative: 7.2 %
Neutro Abs: 6495 cells/uL (ref 1500–7800)
Neutrophils Relative %: 70.6 %
Platelets: 212 10*3/uL (ref 140–400)
RBC: 4.01 10*6/uL (ref 3.80–5.10)
RDW: 14.4 % (ref 11.0–15.0)
Total Lymphocyte: 19.6 %
WBC: 9.2 10*3/uL (ref 3.8–10.8)

## 2021-10-13 LAB — COMPREHENSIVE METABOLIC PANEL
ALT: 21 U/L (ref 0–44)
AST: 32 U/L (ref 15–41)
Albumin: 2.8 g/dL — ABNORMAL LOW (ref 3.5–5.0)
Alkaline Phosphatase: 33 U/L — ABNORMAL LOW (ref 38–126)
Anion gap: 8 (ref 5–15)
BUN: 24 mg/dL — ABNORMAL HIGH (ref 8–23)
CO2: 25 mmol/L (ref 22–32)
Calcium: 8.6 mg/dL — ABNORMAL LOW (ref 8.9–10.3)
Chloride: 106 mmol/L (ref 98–111)
Creatinine, Ser: 1.21 mg/dL — ABNORMAL HIGH (ref 0.44–1.00)
GFR, Estimated: 46 mL/min — ABNORMAL LOW (ref >60)
Glucose, Bld: 235 mg/dL — ABNORMAL HIGH (ref 70–99)
Potassium: 3.7 mmol/L (ref 3.5–5.1)
Sodium: 139 mmol/L (ref 135–145)
Total Bilirubin: 0.6 mg/dL (ref 0.3–1.2)
Total Protein: 4.8 g/dL — ABNORMAL LOW (ref 6.5–8.1)

## 2021-10-13 LAB — GLUCOSE, CAPILLARY
Glucose-Capillary: 155 mg/dL — ABNORMAL HIGH (ref 70–99)
Glucose-Capillary: 195 mg/dL — ABNORMAL HIGH (ref 70–99)

## 2021-10-13 LAB — PROTIME-INR
INR: 1.2 (ref 0.8–1.2)
Prothrombin Time: 15.4 seconds — ABNORMAL HIGH (ref 11.4–15.2)

## 2021-10-13 LAB — CBC
HCT: 28.2 % — ABNORMAL LOW (ref 36.0–46.0)
Hemoglobin: 9.4 g/dL — ABNORMAL LOW (ref 12.0–15.0)
MCH: 30.2 pg (ref 26.0–34.0)
MCHC: 33.3 g/dL (ref 30.0–36.0)
MCV: 90.7 fL (ref 80.0–100.0)
Platelets: 152 10*3/uL (ref 150–400)
RBC: 3.11 MIL/uL — ABNORMAL LOW (ref 3.87–5.11)
RDW: 15.6 % — ABNORMAL HIGH (ref 11.5–15.5)
WBC: 9.8 10*3/uL (ref 4.0–10.5)
nRBC: 0 % (ref 0.0–0.2)

## 2021-10-13 LAB — POCT I-STAT, CHEM 8
BUN: 27 mg/dL — ABNORMAL HIGH (ref 8–23)
Calcium, Ion: 1.2 mmol/L (ref 1.15–1.40)
Chloride: 106 mmol/L (ref 98–111)
Creatinine, Ser: 0.8 mg/dL (ref 0.44–1.00)
Glucose, Bld: 130 mg/dL — ABNORMAL HIGH (ref 70–99)
HCT: 37 % (ref 36.0–46.0)
Hemoglobin: 12.6 g/dL (ref 12.0–15.0)
Potassium: 3.6 mmol/L (ref 3.5–5.1)
Sodium: 141 mmol/L (ref 135–145)
TCO2: 25 mmol/L (ref 22–32)

## 2021-10-13 LAB — SARS CORONAVIRUS 2 (TAT 6-24 HRS): SARS Coronavirus 2: NEGATIVE

## 2021-10-13 LAB — SEDIMENTATION RATE: Sed Rate: 2 mm/h (ref 0–30)

## 2021-10-13 LAB — C-REACTIVE PROTEIN: CRP: 4.3 mg/L (ref <8.0)

## 2021-10-13 SURGERY — ABDOMINAL AORTOGRAM W/LOWER EXTREMITY
Anesthesia: LOCAL | Laterality: Right

## 2021-10-13 MED ORDER — METOPROLOL TARTRATE 5 MG/5ML IV SOLN: 2.0000 mg | INTRAVENOUS | Status: DC | PRN

## 2021-10-13 MED ORDER — SODIUM CHLORIDE 0.9% FLUSH: 10.0000 mL | Freq: Two times a day (BID) | INTRAVENOUS | Status: DC

## 2021-10-13 MED ORDER — FENTANYL CITRATE (PF) 100 MCG/2ML IJ SOLN
INTRAMUSCULAR | Status: AC
  Filled 2021-10-13: qty 2

## 2021-10-13 MED ORDER — IODIXANOL 320 MG/ML IV SOLN
INTRAVENOUS | Status: DC | PRN
  Administered 2021-10-13: 115 mL via INTRA_ARTERIAL

## 2021-10-13 MED ORDER — ACETAMINOPHEN 325 MG PO TABS: 650.0000 mg | ORAL_TABLET | ORAL | Status: DC | PRN

## 2021-10-13 MED ORDER — FENTANYL CITRATE (PF) 100 MCG/2ML IJ SOLN
INTRAMUSCULAR | Status: DC | PRN
  Administered 2021-10-13 (×2): 50 ug via INTRAVENOUS

## 2021-10-13 MED ORDER — POTASSIUM CHLORIDE CRYS ER 20 MEQ PO TBCR
20.0000 meq | EXTENDED_RELEASE_TABLET | Freq: Once | ORAL | Status: AC
  Administered 2021-10-13: 20 meq via ORAL
  Filled 2021-10-13: qty 1

## 2021-10-13 MED ORDER — LABETALOL HCL 5 MG/ML IV SOLN: 10.0000 mg | INTRAVENOUS | Status: DC | PRN

## 2021-10-13 MED ORDER — GUAIFENESIN-DM 100-10 MG/5ML PO SYRP
15.0000 mL | ORAL_SOLUTION | ORAL | Status: DC | PRN
Start: 2021-10-13 — End: 2021-10-14

## 2021-10-13 MED ORDER — LIDOCAINE HCL (PF) 1 % IJ SOLN
INTRAMUSCULAR | Status: DC | PRN
  Administered 2021-10-13: 15 mL via INTRADERMAL

## 2021-10-13 MED ORDER — DIAZEPAM 2 MG PO TABS
ORAL_TABLET | ORAL | Status: DC | PRN
  Administered 2021-10-13: 5 mg via ORAL

## 2021-10-13 MED ORDER — SODIUM CHLORIDE 0.9 % IV SOLN: INTRAVENOUS | Status: DC

## 2021-10-13 MED ORDER — DIAZEPAM 5 MG PO TABS: 5.0000 mg | ORAL_TABLET | Freq: Once | ORAL | Status: DC

## 2021-10-13 MED ORDER — ONDANSETRON HCL 4 MG/2ML IJ SOLN: 4.0000 mg | Freq: Four times a day (QID) | INTRAMUSCULAR | Status: DC | PRN

## 2021-10-13 MED ORDER — SODIUM CHLORIDE 0.9% FLUSH: 3.0000 mL | INTRAVENOUS | Status: DC | PRN

## 2021-10-13 MED ORDER — LIDOCAINE HCL (PF) 1 % IJ SOLN
INTRAMUSCULAR | Status: AC
  Filled 2021-10-13: qty 30

## 2021-10-13 MED ORDER — HYDRALAZINE HCL 20 MG/ML IJ SOLN: 5.0000 mg | INTRAMUSCULAR | Status: DC | PRN

## 2021-10-13 MED ORDER — ACETAMINOPHEN 325 MG PO TABS
650.0000 mg | ORAL_TABLET | Freq: Four times a day (QID) | ORAL | Status: DC
  Administered 2021-10-13 – 2021-10-14 (×2): 650 mg via ORAL
  Filled 2021-10-13 (×3): qty 2

## 2021-10-13 MED ORDER — HEPARIN SODIUM (PORCINE) 1000 UNIT/ML IJ SOLN
INTRAMUSCULAR | Status: DC | PRN
  Administered 2021-10-13: 1000 [IU] via INTRAVENOUS
  Administered 2021-10-13: 7000 [IU] via INTRAVENOUS

## 2021-10-13 MED ORDER — HEPARIN SODIUM (PORCINE) 1000 UNIT/ML IJ SOLN
INTRAMUSCULAR | Status: AC
  Filled 2021-10-13: qty 10

## 2021-10-13 MED ORDER — LORAZEPAM 2 MG/ML IJ SOLN
INTRAMUSCULAR | Status: AC
  Filled 2021-10-13: qty 1

## 2021-10-13 MED ORDER — ONDANSETRON HCL 4 MG/2ML IJ SOLN
INTRAMUSCULAR | Status: AC
  Filled 2021-10-13: qty 2

## 2021-10-13 MED ORDER — SODIUM CHLORIDE 0.9% FLUSH
3.0000 mL | Freq: Two times a day (BID) | INTRAVENOUS | Status: DC
  Administered 2021-10-14: 3 mL via INTRAVENOUS

## 2021-10-13 MED ORDER — HEPARIN SODIUM (PORCINE) 5000 UNIT/ML IJ SOLN
5000.0000 [IU] | Freq: Three times a day (TID) | INTRAMUSCULAR | Status: DC
  Administered 2021-10-13 – 2021-10-14 (×2): 5000 [IU] via SUBCUTANEOUS
  Filled 2021-10-13 (×2): qty 1

## 2021-10-13 MED ORDER — DIAZEPAM 5 MG PO TABS
ORAL_TABLET | ORAL | Status: AC
  Filled 2021-10-13: qty 2

## 2021-10-13 MED ORDER — PANTOPRAZOLE SODIUM 40 MG PO TBEC
40.0000 mg | DELAYED_RELEASE_TABLET | Freq: Every day | ORAL | Status: DC
  Administered 2021-10-13 – 2021-10-14 (×2): 40 mg via ORAL
  Filled 2021-10-13 (×2): qty 1

## 2021-10-13 MED ORDER — PHENOL 1.4 % MT LIQD: 1.0000 | OROMUCOSAL | Status: DC | PRN

## 2021-10-13 MED ORDER — CHLORHEXIDINE GLUCONATE CLOTH 2 % EX PADS
6.0000 | MEDICATED_PAD | Freq: Every day | CUTANEOUS | Status: DC
  Administered 2021-10-13: 6 via TOPICAL

## 2021-10-13 MED ORDER — SODIUM CHLORIDE 0.9 % IV SOLN: 250.0000 mL | INTRAVENOUS | Status: DC | PRN

## 2021-10-13 MED ORDER — HEPARIN (PORCINE) IN NACL 1000-0.9 UT/500ML-% IV SOLN
INTRAVENOUS | Status: AC
  Filled 2021-10-13: qty 1000

## 2021-10-13 MED ORDER — LACTATED RINGERS IV BOLUS
1000.0000 mL | Freq: Once | INTRAVENOUS | Status: AC
  Administered 2021-10-13: 1000 mL via INTRAVENOUS

## 2021-10-13 MED ORDER — SODIUM CHLORIDE 0.9% FLUSH: 10.0000 mL | INTRAVENOUS | Status: DC | PRN

## 2021-10-13 MED ORDER — HEPARIN (PORCINE) IN NACL 1000-0.9 UT/500ML-% IV SOLN
INTRAVENOUS | Status: DC | PRN
  Administered 2021-10-13 (×2): 500 mL

## 2021-10-13 MED ORDER — SODIUM CHLORIDE 0.9 % WEIGHT BASED INFUSION: 1.0000 mL/kg/h | INTRAVENOUS | Status: AC

## 2021-10-13 MED ORDER — ALUM & MAG HYDROXIDE-SIMETH 200-200-20 MG/5ML PO SUSP: 15.0000 mL | ORAL | Status: DC | PRN

## 2021-10-13 MED ORDER — LORAZEPAM 2 MG/ML IJ SOLN
1.0000 mg | Freq: Once | INTRAMUSCULAR | Status: AC
  Administered 2021-10-13: 1 mg via INTRAVENOUS

## 2021-10-13 MED ORDER — LORAZEPAM 1 MG PO TABS
1.0000 mg | ORAL_TABLET | Freq: Two times a day (BID) | ORAL | Status: DC
  Filled 2021-10-13: qty 1

## 2021-10-13 MED ORDER — ONDANSETRON HCL 4 MG/2ML IJ SOLN
INTRAMUSCULAR | Status: DC | PRN
  Administered 2021-10-13: 4 mg via INTRAVENOUS

## 2021-10-13 MED ORDER — INSULIN ASPART 100 UNIT/ML IJ SOLN: 0.0000 [IU] | Freq: Three times a day (TID) | INTRAMUSCULAR | Status: DC

## 2021-10-13 SURGICAL SUPPLY — 27 items
BALLN MUSTANG 6.0X40 75 (BALLOONS) ×3
BALLN STERLING OTW 3X60X150 (BALLOONS) ×3
BALLN STERLING OTW 4X220X150 (BALLOONS) ×3
BALLOON MUSTANG 6.0X40 75 (BALLOONS) IMPLANT
BALLOON STERLING OTW 3X60X150 (BALLOONS) IMPLANT
BALLOON STERLING OTW 4X220X150 (BALLOONS) IMPLANT
CATH OMNI FLUSH 5F 65CM (CATHETERS) ×1 IMPLANT
CATH QUICKCROSS .035X135CM (MICROCATHETER) ×1 IMPLANT
CLOSURE PERCLOSE PROSTYLE (VASCULAR PRODUCTS) ×1 IMPLANT
DCB RANGER 4.0X150 150 (BALLOONS) IMPLANT
DCB RANGER 7.0X40 135 (BALLOONS) IMPLANT
DEVICE CLOSURE MYNXGRIP 6/7F (Vascular Products) ×1 IMPLANT
GLIDEWIRE ADV .035X260CM (WIRE) ×1 IMPLANT
KIT ENCORE 26 ADVANTAGE (KITS) ×1 IMPLANT
KIT MICROPUNCTURE NIT STIFF (SHEATH) ×1 IMPLANT
KIT PV (KITS) ×3 IMPLANT
RANGER DCB 4.0X150 150 (BALLOONS) ×3
RANGER DCB 7.0X40 135 (BALLOONS) ×3
SHEATH FLEX ANSEL ANG 6F 45CM (SHEATH) ×1 IMPLANT
SHEATH PINNACLE 5F 10CM (SHEATH) ×1 IMPLANT
SHEATH PINNACLE 6F 10CM (SHEATH) ×1 IMPLANT
SHEATH PINNACLE ST 6F 45CM (SHEATH) ×1 IMPLANT
SYR MEDRAD MARK V 150ML (SYRINGE) ×1 IMPLANT
TRANSDUCER W/STOPCOCK (MISCELLANEOUS) ×3 IMPLANT
TRAY PV CATH (CUSTOM PROCEDURE TRAY) ×3 IMPLANT
WIRE BENTSON .035X145CM (WIRE) ×1 IMPLANT
WIRE G V18X300CM (WIRE) ×2 IMPLANT

## 2021-10-13 NOTE — Telephone Encounter (Signed)
Per Dr. Baxter Flattery patient will need picc line placement. I have spoke with Dr. Baxter Flattery and she is ok with [place] working on getting this scheduled on Monday. I have sent a secured chat to [place] with IR to start the process with getting the patient scheduled for picc line placement. I will follow up on Monday 10/16/21

## 2021-10-13 NOTE — Telephone Encounter (Signed)
Anderson [place] with IR sent message stating patient could be scheduled on 10/19/21 . Per Dr. Baxter Flattery patient will get daptomycin and meropenem. Patient has had both antibiotics in the past and should not need short stay. I will follow up with patient on Monday with appointment information. Patient is currently at the hospital having an angiogram

## 2021-10-13 NOTE — Progress Notes (Signed)
Patient temperature checked at 96.1 temporal, patient however feels hot to the touch. Temp rechecked with different temporal thermometer and resulted at 101 F.  Temperature also checked orally and resulted also at 101.   BP 408-144 systolic, but systolic is low showing  between 81-85 diastolic.  Dr. Stanford Breed notified.

## 2021-10-13 NOTE — Progress Notes (Signed)
Peripherally Inserted Central Catheter Placement  The IV Nurse has discussed with the patient and/or persons authorized to consent for the patient, the purpose of this procedure and the potential benefits and risks involved with this procedure.  The benefits include less needle sticks, lab draws from the catheter, and the patient may be discharged home with the catheter. Risks include, but not limited to, infection, bleeding, blood clot (thrombus formation), and puncture of an artery; nerve damage and irregular heartbeat and possibility to perform a PICC exchange if needed/ordered by physician.  Alternatives to this procedure were also discussed.  Bard Power PICC patient education guide, fact sheet on infection prevention and patient information card has been provided to patient /or left at bedside.    PICC Placement Documentation  PICC Single Lumen 10/13/21 Right Brachial 34 cm 0 cm (Active)  Indication for Insertion or Continuance of Line Home intravenous therapies (PICC only) 10/13/21 2055  Exposed Catheter (cm) 0 cm 10/13/21 2055  Site Assessment Clean;Dry;Intact 10/13/21 2055  Line Status Flushed;Saline locked;Blood return noted 10/13/21 2055  Dressing Type Transparent;Securing device 10/13/21 2055  Dressing Status Clean;Dry;Intact 10/13/21 2055  Antimicrobial disc in place? Yes 10/13/21 2055  Safety Lock Not Applicable 26/20/35 5974  Dressing Intervention New dressing 10/13/21 2055  Dressing Change Due 10/20/21 10/13/21 2055       Shon Hale 10/13/2021, 9:00 PM

## 2021-10-13 NOTE — Interval H&P Note (Signed)
History and Physical Interval Note:  10/13/2021 11:54 AM  [name]  has presented today for surgery, with the diagnosis of pad.  The various methods of treatment have been discussed with the patient and family. After consideration of risks, benefits and other options for treatment, the patient has consented to  Procedure(s): ABDOMINAL AORTOGRAM W/LOWER EXTREMITY (N/A) PERIPHERAL VASCULAR BALLOON ANGIOPLASTY (Right) as a surgical intervention.  The patient's history has been reviewed, patient examined, no change in status, stable for surgery.  I have reviewed the patient's chart and labs.  Questions were answered to the patient's satisfaction.     [name]

## 2021-10-13 NOTE — Op Note (Addendum)
DATE OF SERVICE: 10/13/2021  PATIENT:  [name]  81 y.o. 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 Stanford Breed, MD  ASSISTANT: none  ANESTHESIA:   local and IV sedation  ESTIMATED BLOOD LOSS: minimal  LOCAL MEDICATIONS USED:  LIDOCAINE   COUNTS: confirmed correct.  PATIENT DISPOSITION:  PACU - hemodynamically stable.   Delay start of Pharmacological VTE agent (>24hrs) due to surgical blood loss or risk of bleeding: no  INDICATION FOR PROCEDURE: [name] is a 81 y.o. female with complex past vascular history:  07/21/2019: Right iliofemoral endarterectomy 08/16/2020: Right redo femoral endarterectomy and femoral to above-knee popliteal artery bypass grafting with 6 mm PTFE. 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  Preoperatively, she had an ABI of 0. After careful discussion of risks, benefits, and alternatives the patient was offered angiography with possible intervention. The patient understood and wished to proceed.  OPERATIVE FINDINGS:  Left lower extremity: External iliac and common femoral artery: Critical stenosis in the distalmost  external iliac artery and common femoral artery. Profunda femoris artery: Diminutive, fills mostly retrograde from pelvic collateralization. Superficial femoral artery: Occluded Femoral to above-knee popliteal artery bypass: Patent Popliteal artery: Heavily diseased.  Bypass remains patent through retrograde flow into the above-knee popliteal artery and superficial femoral artery.  The remainder of the popliteal artery is patent but heavily stenosed (greater than 95%). Anterior tibial artery: Occluded Tibioperoneal trunk: Patent, several areas of heavy stenosis in the TP trunk and peroneal artery (approximately 80%) Peroneal artery: Patent, several areas of heavy stenosis in the TP trunk and peroneal artery (approximately 80%) Posterior tibial artery: occluded Pedal circulation: fills via arborization at the ankle from the peroneal.  DESCRIPTION OF PROCEDURE: After identification of the patient in the pre-operative holding area, the patient was transferred to the operating room. The patient was positioned supine on the operating room table. Anesthesia was induced. The groins was prepped and draped in standard fashion. A surgical pause was performed confirming correct patient, procedure, and operative location.  The left groin was anesthetized with subcutaneous injection of 1% lidocaine. Using ultrasound guidance, the right common femoral artery was accessed with micropuncture technique. Fluoroscopy was used to confirm cannulation over the femoral head. The 22F sheath was upsized to 68F.   The right common iliac artery was selected with a glidewire advantage guidewire. The wire was advanced into the common femoral artery. Over the wire the omni flush catheter was advanced into the external iliac artery. Selective angiography was performed - see above for details.   The decision was made to intervene. The patient was heparinized with 7,000 units of heparin. The 68F sheath was exchanged for a 6Fx45cm  sheath. Selective angiography of the left lower extremity was performed prior to intervention.   The lesions were treated with: External iliac / common femoral stenosis - drug  coated balloon angioplasty (6x33mm Ranger) Popliteal stenosis - drug coated balloon angioplasty (4x151mm Ranger) TP trunk / peroneal stenosis - angioplasty (3x70mm Sterling)  Completion angiography revealed:  Resolution of stenosis  A mynx device was used to close the arteriotomy. Hemostasis was excellent upon completion.  Upon completion of the case instrument and sharps counts were confirmed correct. The patient was transferred to the PACU in good condition. I was present for all portions of the procedure.  PLAN: Good technical result from endovascular intervention.  She is vascularly optimized in the right lower extremity.  We will check a plain x-ray today in the recovery area to look for evidence of osteomyelitis.  Follow-up with me in 2 weeks for wound check.  We will repeat ABI and duplex at that time.  Continue aspirin 81 mg by mouth daily.  Continue Xarelto 2.5 mg by mouth twice daily.  Continue high intensity statin therapy  [name]. Stanford Breed, MD [facility] Phone Number: [phone] 10/13/2021 11:34 AM

## 2021-10-13 NOTE — Progress Notes (Signed)
Patient in holding area post procedure. Patient somewhat confused. States she feels tired. Patient moans at times. No pain complaints. 12lead EKG performed.  Ekg shown to Dr. Gwenlyn Found.   Grip strength strong and equal in both hands. Smile is symetrical.   Bilateral dp and pt pulses present with doppler, right dp faint but present.  Left groin site level 0.

## 2021-10-13 NOTE — Progress Notes (Signed)
Eating a [place] sandwich lunch box. Warm and dry; no c/o discomfort

## 2021-10-14 DIAGNOSIS — E11621 Type 2 diabetes mellitus with foot ulcer: Secondary | ICD-10-CM | POA: Diagnosis not present

## 2021-10-14 DIAGNOSIS — E11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: Secondary | ICD-10-CM | POA: Diagnosis not present

## 2021-10-14 DIAGNOSIS — Z89431 Acquired absence of right foot: Secondary | ICD-10-CM | POA: Diagnosis not present

## 2021-10-14 DIAGNOSIS — Z20822 Contact with and (suspected) exposure to covid-19: Secondary | ICD-10-CM | POA: Diagnosis not present

## 2021-10-14 DIAGNOSIS — I70238 Atherosclerosis of native arteries of right leg with ulceration of other part of lower right leg: Secondary | ICD-10-CM | POA: Diagnosis not present

## 2021-10-14 DIAGNOSIS — L97519 Non-pressure chronic ulcer of other part of right foot with unspecified severity: Secondary | ICD-10-CM | POA: Diagnosis not present

## 2021-10-14 LAB — LIPID PANEL
Cholesterol: 97 mg/dL (ref 0–200)
HDL: 29 mg/dL — ABNORMAL LOW (ref >40)
LDL Cholesterol: 50 mg/dL (ref 0–99)
Total CHOL/HDL Ratio: 3.3 RATIO
Triglycerides: 89 mg/dL (ref <150)
VLDL: 18 mg/dL (ref 0–40)

## 2021-10-14 LAB — URINALYSIS, ROUTINE W REFLEX MICROSCOPIC
Bilirubin Urine: NEGATIVE
Glucose, UA: >=500 mg/dL — AB
Ketones, ur: NEGATIVE mg/dL
Nitrite: NEGATIVE
Protein, ur: 30 mg/dL — AB
Specific Gravity, Urine: 1.025 (ref 1.005–1.030)
WBC, UA: >50 WBC/hpf — ABNORMAL HIGH (ref 0–5)
pH: 5 (ref 5.0–8.0)

## 2021-10-14 LAB — GLUCOSE, CAPILLARY: Glucose-Capillary: 111 mg/dL — ABNORMAL HIGH (ref 70–99)

## 2021-10-14 MED ORDER — OXYCODONE-ACETAMINOPHEN 5-325 MG PO TABS
1.0000 | ORAL_TABLET | Freq: Four times a day (QID) | ORAL | 0 refills | Status: DC | PRN
Start: 2021-10-14 — End: 2021-11-14

## 2021-10-14 NOTE — Progress Notes (Signed)
PHARMACIST LIPID MONITORING   [name] is a 81 y.o. female admitted on 10/13/2021 with arteriography and RLE intervention.  Pharmacy has been consulted to optimize lipid-lowering therapy with the indication of secondary prevention for clinical ASCVD.  Recent Labs:  Lipid Panel (last 6 months):   Lab Results  Component Value Date   CHOL 97 10/14/2021   TRIG 89 10/14/2021   HDL 29 (L) 10/14/2021   CHOLHDL 3.3 10/14/2021   VLDL 18 10/14/2021   LDLCALC 50 10/14/2021    Hepatic function panel (last 6 months):   Lab Results  Component Value Date   AST 32 10/13/2021   ALT 21 10/13/2021   ALKPHOS 33 (L) 10/13/2021   BILITOT 0.6 10/13/2021    SCr (since admission):   Serum creatinine: 1.21 mg/dL (H) 10/13/21 1948 Estimated creatinine clearance: 33.3 mL/min (A)  Current therapy and lipid therapy tolerance Current lipid-lowering therapy: atorvastatin 80 mg  Previous lipid-lowering therapies (if applicable): Has been on lower doses of atorvastatin (10-40 mg) previously and simvastatin 20 mg  Documented or reported allergies or intolerances to lipid-lowering therapies (if applicable): N/A  Assessment:   Patient is at goal with LDL 50 today.  Plan:    1.Statin intensity (high intensity recommended for all patients regardless of the LDL):  No statin changes. The patient is already on a high intensity statin.  2.Add ezetimibe (if any one of the following):   Not indicated at this time.  3.Refer to lipid clinic:   No  4.Follow-up with:  Primary care provider - [name], MD  5.Follow-up labs after discharge:  No changes in lipid therapy, repeat a lipid panel in one year.      Antonietta Jewel, PharmD, [place] Clinical Pharmacist  Phone: [phone] 10/14/2021 9:48 AM  Please check AMION for all [place] phone numbers After 10:00 PM, call [place] [phone]

## 2021-10-14 NOTE — Discharge Summary (Incomplete)
Discharge Summary  Patient ID: [name] [id number] 80 y.o. [birth year]  Admit date: 10/13/2021  Discharge date and time: No discharge date for patient encounter.   Admitting Physician: [name], MD   Discharge Physician: ***  Admission Diagnoses: S/P angiogram of extremity [Z98.890]  Discharge Diagnoses: ***  Admission Condition: {condition:18240}  Discharged Condition: {condition:18240}  Indication for Admission: [facility] Course: ***  Consults: {consultation:18241}  Treatments: {Tx:18249}  Discharge Exam: *** Vitals:   10/14/21 0306 10/14/21 0749  BP: (!) 116/40 (!) 119/41  Pulse: 77 100  Resp: 14 12  Temp: 98 F (36.7 C) 99.7 F (37.6 C)  SpO2: 99% 90%   Cardiac:  *** Lungs:  *** Incisions:  *** Extremities:  *** Abdomen:  *** Neurologic: ***   Disposition: Discharge disposition: 01-Home or Self Care       Patient Instructions:  Allergies as of 10/14/2021       Reactions   Bactrim [sulfamethoxazole-trimethoprim] Other (See Comments)   ^ K+( elevated)    Demerol [meperidine]    Delusional    Scopolamine    Delusional    Tramadol Other (See Comments)   Keeps awake   Versed [midazolam] Anxiety   Frantic, out of my mind, agitated         Medication List     TAKE these medications    acetaminophen 500 MG tablet Commonly known as: TYLENOL Take 1,000 mg by mouth every 6 (six) hours as needed for moderate pain or headache.   amoxicillin-clavulanate 875-125 MG tablet Commonly known as: AUGMENTIN Take 1 tablet by mouth 2 (two) times daily.   aspirin EC 81 MG tablet Take 81 mg by mouth at bedtime.   atorvastatin 80 MG tablet Commonly known as: LIPITOR Take 1 tablet (80 mg total) by mouth daily. Patient must keep upcoming appointment to receive future refills. What changed: when to take this   brimonidine 0.2 % ophthalmic solution Commonly known as: ALPHAGAN Place 1 drop into the right eye 2 (two) times daily.    CINNAMON PO Take 2 tablets by mouth in the morning, at noon, and at bedtime.   [employer] SENIOR PROBIOTIC PO Take 1 capsule by mouth every evening.   empagliflozin 25 MG Tabs tablet Commonly known as: Jardiance Take 1 tablet (25 mg total) by mouth daily before breakfast.   Ferocon capsule Generic drug: ferrous [id number] C-folic acid Take 1 capsule by mouth 2 (two) times daily.   glimepiride 2 MG tablet Commonly known as: AMARYL TAKE 1 TABLET BY MOUTH ONCE DAILY BEFORE BREAKFAST   hydrochlorothiazide 12.5 MG tablet Commonly known as: HYDRODIURIL Take 1 tablet by mouth once daily   metFORMIN 1000 MG tablet Commonly known as: GLUCOPHAGE Take 1 tablet by mouth twice daily What changed: when to take this   metoprolol tartrate 25 MG tablet Commonly known as: LOPRESSOR TAKE 1 & 1/2 (ONE & ONE-HALF) TABLETS BY MOUTH TWICE DAILY   OneTouch Delica Plus [id number] Misc Use to check blood sugars twice daily.   OneTouch Verio test strip Generic drug: glucose blood Use to check blood sugars once daily   oxyCODONE-acetaminophen 5-325 MG tablet Commonly known as: Percocet Take 1 tablet by mouth every 6 (six) hours as needed for severe pain.   pantoprazole 40 MG tablet Commonly known as: PROTONIX Take 1 tablet (40 mg total) by mouth daily. What changed: when to take this   pyridoxine 100 MG tablet Commonly known as: B-6 Take 100 mg by mouth every evening.  repaglinide 1 MG tablet Commonly known as: Prandin Take 1 tablet (1 mg total) by mouth daily before breakfast.   Systane Balance 0.6 % Soln Generic drug: Propylene Glycol Place 1 drop into the left eye 3 (three) times daily as needed (dry/irritated eyes).   telmisartan 80 MG tablet Commonly known as: MICARDIS Take 1 tablet by mouth once daily   Xarelto 2.5 MG Tabs tablet Generic drug: rivaroxaban Take 1 tablet by mouth twice daily               Discharge Care Instructions  (From admission, onward)            Start     Ordered   10/14/21 0000  Discharge wound care:       Comments: Left groin catheter site keep dressing on for 24 hours. May remove after that and do no need to apply any dressing. Wash as normal with mild soap and water, pat dry. Do not soak in bathtub. For right foot continue daily dressing changes as instructed   10/14/21 0857           Activity: {discharge activity:18261} Diet: {diet:18262} Wound Care: {wound care:18263}  Follow-up with *** in {0-10:33138} {units:11}.  SignedKaroline Caldwell, PA-C 10/14/2021 11:10 AM [facility] Office: [phone]

## 2021-10-14 NOTE — Progress Notes (Signed)
° °  VASCULAR SURGERY ASSESSMENT & PLAN:   S/P ARTERIOGRAPHY AND RIGHT LOWER EXTREMITY INTERVENTION: The patient underwent successful angioplasty of the right external iliac, common femoral, popliteal, and tibial peroneal trunk arteries.  She has good Doppler flow in the right foot.  Her foot x-ray did not show any evidence of osteomyelitis.  The PICC line is placed yesterday.  Currently I do not see a specific infection to treat.  We will discharge her with the PICC line and if the decision is made to start outpatient intravenous antibiotics then this can certainly be arranged as an outpatient.  Plan discharge today.  SUBJECTIVE:   No specific complaints this morning  PHYSICAL EXAM:   Vitals:   10/13/21 1820 10/13/21 1947 10/13/21 2344 10/14/21 0306  BP: (!) 108/40 (!) 121/38 (!) 104/37 (!) 116/40  Pulse: 92 88 86 77  Resp: 13 12 14 14   Temp:  98.4 F (36.9 C) 98.6 F (37 C) 98 F (36.7 C)  TempSrc:  Oral Oral Oral  SpO2: 92% 95% 97% 99%  Weight:      Height:       She has a brisk posterior tibial signal with a Doppler and a monophasic anterior tibial signal. No hematoma left groin.  LABS:   Lab Results  Component Value Date   WBC 9.8 10/13/2021   HGB 9.4 (L) 10/13/2021   HCT 28.2 (L) 10/13/2021   MCV 90.7 10/13/2021   PLT 152 10/13/2021   Lab Results  Component Value Date   CREATININE 1.21 (H) 10/13/2021    CBG (last 3)  Recent Labs    10/13/21 1242 10/13/21 2133 10/14/21 0629  GLUCAP 155* 195* 111*    PROBLEM LIST:    Principal Problem:   S/P angiogram of extremity   CURRENT MEDS:    acetaminophen  650 mg Oral Q6H   Chlorhexidine Gluconate Cloth  6 each Topical Daily   diazepam  5 mg Oral Once   heparin  5,000 Units Subcutaneous Q8H   insulin aspart  0-15 Units Subcutaneous TID WC   LORazepam  1 mg Oral BID   pantoprazole  40 mg Oral Daily   sodium chloride flush  10-40 mL Intracatheter Q12H   sodium chloride flush  3 mL Intravenous Q12H     Deitra Mayo Office: [phone] 10/14/2021

## 2021-10-16 ENCOUNTER — Telehealth: Payer: Self-pay | Admitting: Internal Medicine

## 2021-10-16 ENCOUNTER — Encounter (HOSPITAL_COMMUNITY): Payer: Self-pay | Admitting: Vascular Surgery

## 2021-10-16 DIAGNOSIS — D638 Anemia in other chronic diseases classified elsewhere: Secondary | ICD-10-CM | POA: Diagnosis not present

## 2021-10-16 DIAGNOSIS — I70202 Unspecified atherosclerosis of native arteries of extremities, left leg: Secondary | ICD-10-CM | POA: Diagnosis not present

## 2021-10-16 DIAGNOSIS — L89620 Pressure ulcer of left heel, unstageable: Secondary | ICD-10-CM | POA: Diagnosis not present

## 2021-10-16 DIAGNOSIS — E11628 Type 2 diabetes mellitus with other skin complications: Secondary | ICD-10-CM | POA: Diagnosis not present

## 2021-10-16 DIAGNOSIS — Z4781 Encounter for orthopedic aftercare following surgical amputation: Secondary | ICD-10-CM | POA: Diagnosis not present

## 2021-10-16 DIAGNOSIS — I10 Essential (primary) hypertension: Secondary | ICD-10-CM | POA: Diagnosis not present

## 2021-10-16 LAB — POCT I-STAT, CHEM 8
BUN: 41 mg/dL — ABNORMAL HIGH (ref 8–23)
Calcium, Ion: 1.18 mmol/L (ref 1.15–1.40)
Chloride: 104 mmol/L (ref 98–111)
Creatinine, Ser: 0.9 mg/dL (ref 0.44–1.00)
Glucose, Bld: 124 mg/dL — ABNORMAL HIGH (ref 70–99)
HCT: 36 % (ref 36.0–46.0)
Hemoglobin: 12.2 g/dL (ref 12.0–15.0)
Potassium: 6.7 mmol/L (ref 3.5–5.1)
Sodium: 138 mmol/L (ref 135–145)
TCO2: 29 mmol/L (ref 22–32)

## 2021-10-16 MED FILL — Diazepam Tab 5 MG: ORAL | Qty: 1 | Status: AC

## 2021-10-16 NOTE — Discharge Summary (Signed)
Discharge Summary  Patient ID: [name] [id number] 80 y.o. Jan 18, 1941  Admit date: 10/13/2021  Discharge date and time: 10/14/2021 11:03 AM   Admitting Physician: [name], MD   Discharge Physician: Deitra Mayo, MD  Admission Diagnoses: S/P angiogram of extremity [Z98.890]  Discharge Diagnoses: Peripheral Artery disease  Admission Condition: fair  Discharged Condition: good  Indication for Admission: JAUNITA MIKELS is a 81 y.o. female with complex past vascular history. She most recently had undergone left common femoral to below knee popliteal artery bypass with third toe amputation. This was non healing so subsequently had a left open TMA. 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  Preoperatively, she had an ABI of 0. After careful discussion of risks, benefits, and alternatives the patient was offered angiography with possible intervention. The patient understood and wished to proceed.  Hospital Course: Charlissa Petros was admitted on 10/13/21 and underwent right lower extremity angiogram, right iliofemoral drug coated balloon angioplasty, right popliteal drug coated balloon angioplasty and right tibioperoneal trunk and peroneal angioplasty by Dr. Stanford Breed. She tolerated the procedure well and was transferred to recovery room in stable condition. She was admitted for overnight observation  By post operative day 1, she remained stable overnight. Her left femoral access site was without swelling or hematoma. Right leg well perfused following intervention with Doppler AT signal. Radiograph showed no evidence of osteomyelitis. PICC line was placed for possible long term antibiotics. PDMP was reviewed and pain medication was sent to her pharmacy. She will continue Aspirin 81 mg and Xarelto 2.5 mg twice daily as well as statin. She has follow up arranged in 2 weeks with ABI and  arterial duplex.  Consults: None  Treatments: antibiotics: Augmentin, anticoagulation: ASA and Xarelto, and procedures: PICC line    Disposition: Discharge disposition: 01-Home or Self Care       Patient Instructions:  Allergies as of 10/14/2021       Reactions   Bactrim [sulfamethoxazole-trimethoprim] Other (See Comments)   ^ K+( elevated)    Demerol [meperidine]    Delusional    Scopolamine    Delusional    Tramadol Other (See Comments)   Keeps awake   Versed [midazolam] Anxiety   Frantic, out of my mind, agitated         Medication List     TAKE these medications    acetaminophen 500 MG tablet Commonly known as: TYLENOL Take 1,000 mg by mouth every 6 (six) hours as needed for moderate pain or headache.   amoxicillin-clavulanate 875-125 MG tablet Commonly known as: AUGMENTIN Take 1 tablet by mouth 2 (two) times daily.   aspirin EC 81 MG tablet Take 81 mg by mouth at bedtime.   atorvastatin 80 MG tablet Commonly known as: LIPITOR Take 1 tablet (80 mg total) by mouth daily. Patient must keep upcoming appointment to receive future refills. What changed: when to take this   brimonidine 0.2 % ophthalmic solution Commonly known as: ALPHAGAN Place 1 drop into the right eye 2 (two) times daily.   CINNAMON PO Take 2 tablets by mouth in the morning, at noon, and at bedtime.   [employer] SENIOR PROBIOTIC PO Take 1 capsule by mouth every evening.   empagliflozin 25 MG Tabs tablet Commonly known as: Jardiance Take 1 tablet (25 mg total) by mouth daily before breakfast.   Ferocon capsule Generic drug: ferrous [id number] C-folic acid Take 1 capsule by mouth 2 (  two) times daily.   glimepiride 2 MG tablet Commonly known as: AMARYL TAKE 1 TABLET BY MOUTH ONCE DAILY BEFORE BREAKFAST   hydrochlorothiazide 12.5 MG tablet Commonly known as: HYDRODIURIL Take 1 tablet by mouth once daily   metFORMIN 1000 MG tablet Commonly known as: GLUCOPHAGE Take 1  tablet by mouth twice daily What changed: when to take this   metoprolol tartrate 25 MG tablet Commonly known as: LOPRESSOR TAKE 1 & 1/2 (ONE & ONE-HALF) TABLETS BY MOUTH TWICE DAILY   OneTouch Delica Plus [id number] Misc Use to check blood sugars twice daily.   OneTouch Verio test strip Generic drug: glucose blood Use to check blood sugars once daily   oxyCODONE-acetaminophen 5-325 MG tablet Commonly known as: Percocet Take 1 tablet by mouth every 6 (six) hours as needed for severe pain.   pantoprazole 40 MG tablet Commonly known as: PROTONIX Take 1 tablet (40 mg total) by mouth daily. What changed: when to take this   pyridoxine 100 MG tablet Commonly known as: B-6 Take 100 mg by mouth every evening.   repaglinide 1 MG tablet Commonly known as: Prandin Take 1 tablet (1 mg total) by mouth daily before breakfast.   Systane Balance 0.6 % Soln Generic drug: Propylene Glycol Place 1 drop into the left eye 3 (three) times daily as needed (dry/irritated eyes).   telmisartan 80 MG tablet Commonly known as: MICARDIS Take 1 tablet by mouth once daily   Xarelto 2.5 MG Tabs tablet Generic drug: rivaroxaban Take 1 tablet by mouth twice daily               Discharge Care Instructions  (From admission, onward)           Start     Ordered   10/14/21 0000  Discharge wound care:       Comments: Left groin catheter site keep dressing on for 24 hours. May remove after that and do no need to apply any dressing. Wash as normal with mild soap and water, pat dry. Do not soak in bathtub. For right foot continue daily dressing changes as instructed   10/14/21 0857           Activity: activity as tolerated, no driving while on analgesics, and no heavy lifting for 4 weeks Diet: cardiac diet Wound Care:  keep access site clean and dry  Follow-up with Dr. Stanford Breed in 2 weeks.  SignedKaroline Caldwell, PA-C 10/16/2021 7:46 AM [facility] Office: [phone]

## 2021-10-16 NOTE — Telephone Encounter (Signed)
Thank you for coordinating.

## 2021-10-16 NOTE — Telephone Encounter (Signed)
Patient is to start iv abtx for foot osteomyelitis. Had picc line placed on 1/20. 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 Start initially then follow per protocol  Duration: 6 wk [place] Date[place] 6th  [place]  Home health RN for IV administration and teaching; PICC line care and labs.    Labs weekly while on IV antibiotics: _x_ CBC with differential _x_ BMP  _x_ CRP _x_ ESR  _x_ CK  _x_ Please pull PIC at completion of IV antibiotics   Fax weekly labs to [phone]  Clinic Follow Up Appt: 2-3 weeks  @ [facility]

## 2021-10-16 NOTE — Telephone Encounter (Addendum)
Patient called this morning stating her picc line was placed Friday evening after her angiogram. I have reached out to [facility] with [facility] to let her know the picc line has been placed and patient will need teaching on administering IV antibiotics and maintaining picc line. Dr. Baxter Flattery has also put in opat orders for [facility] to go ahead and get medication out to the patient. Patient is already establish with [facility] as well. Pam will follow up with our office once she get everything setup for the patient. Kamilah Correia T Brooks Sailors

## 2021-10-17 DIAGNOSIS — Z4781 Encounter for orthopedic aftercare following surgical amputation: Secondary | ICD-10-CM | POA: Diagnosis not present

## 2021-10-17 DIAGNOSIS — E11628 Type 2 diabetes mellitus with other skin complications: Secondary | ICD-10-CM | POA: Diagnosis not present

## 2021-10-17 DIAGNOSIS — I70202 Unspecified atherosclerosis of native arteries of extremities, left leg: Secondary | ICD-10-CM | POA: Diagnosis not present

## 2021-10-17 DIAGNOSIS — L89620 Pressure ulcer of left heel, unstageable: Secondary | ICD-10-CM | POA: Diagnosis not present

## 2021-10-17 DIAGNOSIS — D638 Anemia in other chronic diseases classified elsewhere: Secondary | ICD-10-CM | POA: Diagnosis not present

## 2021-10-17 DIAGNOSIS — I10 Essential (primary) hypertension: Secondary | ICD-10-CM | POA: Diagnosis not present

## 2021-10-18 ENCOUNTER — Other Ambulatory Visit: Payer: Self-pay

## 2021-10-18 ENCOUNTER — Encounter (HOSPITAL_BASED_OUTPATIENT_CLINIC_OR_DEPARTMENT_OTHER): Payer: Medicare Other | Admitting: Internal Medicine

## 2021-10-18 DIAGNOSIS — L97514 Non-pressure chronic ulcer of other part of right foot with necrosis of bone: Secondary | ICD-10-CM | POA: Diagnosis not present

## 2021-10-18 DIAGNOSIS — E11621 Type 2 diabetes mellitus with foot ulcer: Secondary | ICD-10-CM | POA: Diagnosis not present

## 2021-10-18 DIAGNOSIS — E11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: Secondary | ICD-10-CM | POA: Diagnosis not present

## 2021-10-18 DIAGNOSIS — I2510 Atherosclerotic heart disease of native coronary artery without angina pectoris: Secondary | ICD-10-CM | POA: Diagnosis not present

## 2021-10-18 DIAGNOSIS — L97428 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: Secondary | ICD-10-CM | POA: Diagnosis not present

## 2021-10-18 DIAGNOSIS — T8131XD Disruption of external operation (surgical) wound, not elsewhere classified, subsequent encounter: Secondary | ICD-10-CM | POA: Diagnosis not present

## 2021-10-18 DIAGNOSIS — L97512 Non-pressure chronic ulcer of other part of right foot with fat layer exposed: Secondary | ICD-10-CM | POA: Diagnosis not present

## 2021-10-18 DIAGNOSIS — E10622 Type 1 diabetes mellitus with other skin ulcer: Secondary | ICD-10-CM | POA: Diagnosis not present

## 2021-10-19 ENCOUNTER — Other Ambulatory Visit: Payer: Self-pay | Admitting: Vascular Surgery

## 2021-10-19 ENCOUNTER — Other Ambulatory Visit: Payer: Self-pay | Admitting: Endocrinology

## 2021-10-19 ENCOUNTER — Other Ambulatory Visit (INDEPENDENT_AMBULATORY_CARE_PROVIDER_SITE_OTHER): Payer: Medicare Other

## 2021-10-19 ENCOUNTER — Ambulatory Visit (HOSPITAL_COMMUNITY): Payer: Medicare Other

## 2021-10-19 ENCOUNTER — Other Ambulatory Visit: Payer: Self-pay

## 2021-10-19 DIAGNOSIS — E875 Hyperkalemia: Secondary | ICD-10-CM | POA: Diagnosis not present

## 2021-10-19 LAB — BASIC METABOLIC PANEL
BUN: 14 mg/dL (ref 6–23)
CO2: 29 mEq/L (ref 19–32)
Calcium: 9.9 mg/dL (ref 8.4–10.5)
Chloride: 104 mEq/L (ref 96–112)
Creatinine, Ser: 1.02 mg/dL (ref 0.40–1.20)
GFR: 52.29 mL/min — ABNORMAL LOW (ref >60.00)
Glucose, Bld: 167 mg/dL — ABNORMAL HIGH (ref 70–99)
Potassium: 4.9 mEq/L (ref 3.5–5.1)
Sodium: 143 mEq/L (ref 135–145)

## 2021-10-19 NOTE — Progress Notes (Signed)
BRIASIA, FLINDERS ([id number]) Visit Report for 10/18/2021 Arrival Information Details Patient Name: Date of Service: [name] 10/18/2021 9:00 A M Medical Record Number: [id number] Patient Account Number: [id number] Date of Birth/Sex: Treating RN: 10/11/1940 (81 y.o. [name] Primary Care Carlesha Seiple: [name] Other Clinician: Referring Zyier Dykema: Treating Emmylou Bieker/[name]: Tressie Stalker in Treatment: 80 Visit Information History Since Last Visit Added or deleted any medications: No Patient Arrived: [name] Any new allergies or adverse reactions: No Arrival Time: 09:07 Had a fall or experienced change in No Accompanied By: self activities of daily living that may affect Transfer Assistance: None risk of falls: Patient Requires Transmission-Based Precautions: No Signs or symptoms of abuse/neglect since last visito No Patient Has Alerts: Yes Hospitalized since last visit: No Patient Alerts: Patient on Blood Thinner Implantable device outside of the clinic excluding No L ABI: 0.82 cellular tissue based products placed in the center R ABI: 0.47 since last visit: Has Dressing in Place as Prescribed: Yes Pain Present Now: No Electronic Signature(s) Signed: 10/18/2021 5:52:04 PM By: Dellie Catholic RN Entered By: Dellie Catholic on 10/18/2021 09:18:17 -------------------------------------------------------------------------------- Encounter Discharge Information Details Patient Name: Date of Service: [name], [place]. 10/18/2021 9:00 A M Medical Record Number: [id number] Patient Account Number: [id number] Date of Birth/Sex: Treating RN: [birth year] (81 y.o. [name] Primary Care Tallie Hevia: [name] Other Clinician: Referring Thessaly Mccullers: Treating Torunn Chancellor/[name]: Tressie Stalker in Treatment: 17 Encounter Discharge Information Items Post Procedure Vitals Discharge Condition:  Stable Temperature (F): 97.7 Ambulatory Status: Ambulatory Pulse (bpm): 75 Discharge Destination: Home Respiratory Rate (breaths/min): 16 Transportation: Private Auto Blood Pressure (mmHg): 150/69 Accompanied By: self Schedule Follow-up Appointment: Yes Clinical Summary of Care: Patient Declined Electronic Signature(s) Signed: 10/18/2021 5:52:04 PM By: Dellie Catholic RN Entered By: Dellie Catholic on 10/18/2021 17:31:12 -------------------------------------------------------------------------------- Lower Extremity Assessment Details Patient Name: Date of Service: [name] 10/18/2021 9:00 A M Medical Record Number: [id number] Patient Account Number: [id number] Date of Birth/Sex: Treating RN: Feb 13, 1941 (81 y.o. [name] Primary Care Ankur Snowdon: [name] Other Clinician: Referring Rahma Meller: Treating Mishti Swanton/[name]: Tressie Stalker in Treatment: 17 Edema Assessment Assessed: Shirlyn Goltz: No] Patrice Paradise: No] Edema: [Left: No] [Right: Yes] Calf Left: Right: Point of Measurement: From Medial Instep 31.5 cm 31.3 cm Ankle Left: Right: Point of Measurement: From Medial Instep 20.3 cm 20.3 cm Electronic Signature(s) Signed: 10/18/2021 5:52:04 PM By: Dellie Catholic RN Entered By: Dellie Catholic on 10/18/2021 09:33:12 -------------------------------------------------------------------------------- Multi Wound Chart Details Patient Name: Date of Service: [name], [name] 10/18/2021 9:00 A M Medical Record Number: [id number] Patient Account Number: [id number] Date of Birth/Sex: Treating RN: 01-22-1941 (81 y.o. [name], [name] Primary Care Linah Klapper: [name] Other Clinician: Referring Tennille Montelongo: Treating Jayden Kratochvil/[name]: Tressie Stalker in Treatment: 17 Vital Signs Height(in): 64 Pulse(bpm): 37 Weight(lbs): 135 Blood Pressure(mmHg): 150/69 Body Mass Index(BMI): 23.2 Temperature(F):  97.7 Respiratory Rate(breaths/min): 16 Photos: Left, Posterior Calcaneus Right Amputation Site - Right, Plantar Foot Wound Location: Transmetatarsal Pressure Injury Other Lesion Gradually Appeared Wounding Event: Diabetic Wound/Ulcer of the Lower Diabetic Wound/Ulcer of the Lower Diabetic Wound/Ulcer of the Lower Primary Etiology: Extremity Extremity Extremity Coronary Artery Disease, Deep Vein Coronary Artery Disease, Deep Vein Coronary Artery Disease, Deep Vein Comorbid History: Thrombosis, Hypertension, Myocardial Thrombosis, Hypertension, Myocardial Thrombosis, Hypertension, Myocardial Infarction, Peripheral Arterial Disease, Infarction, Peripheral Arterial Disease, Infarction, Peripheral Arterial Disease, Type II Diabetes Type II Diabetes Type II Diabetes 05/11/2021 09/15/2021 09/26/2021 Date Acquired: 17  4 3 Weeks of Treatment: Healed - Epithelialized Open Open Wound Status: No No No Wound Recurrence: 0x0x0 0.3x1.3x0.2 0.1x0.1x0.2 Measurements L x W x D (cm) 0 0.306 0.008 A (cm) : rea 0 0.061 0.002 Volume (cm) : 100.00% 22.10% 93.20% % Reduction in A rea: 100.00% 48.30% 91.70% % Reduction in Volume: Grade 2 Grade 2 Grade 2 Classification: Medium Medium Medium Exudate A mount: Serosanguineous Serosanguineous Serosanguineous Exudate Type: red, brown red, brown red, brown Exudate Color: Flat and Intact Distinct, outline attached Flat and Intact Wound Margin: None Present (0%) None Present (0%) Small (1-33%) Granulation A mount: N/A N/A Pink Granulation Quality: None Present (0%) Large (67-100%) Large (67-100%) Necrotic A mount: Fascia: No Fat Layer (Subcutaneous Tissue): Yes Fat Layer (Subcutaneous Tissue): Yes Exposed Structures: Fat Layer (Subcutaneous Tissue): No Fascia: No Fascia: No Tendon: No Tendon: No Tendon: No Muscle: No Muscle: No Muscle: No Joint: No Joint: No Joint: No Bone: No Bone: No Bone: No Large (67-100%) None  None Epithelialization: N/A Debridement - Selective/Open Wound N/A Debridement: Pre-procedure Verification/Time Out N/A 10:00 N/A Taken: N/A Other N/A Pain Control: N/A Slough N/A Tissue Debrided: N/A Non-Viable Tissue N/A Level: N/A 0.39 N/A Debridement A (sq cm): rea N/A Curette N/A Instrument: N/A Minimum N/A Bleeding: N/A Pressure N/A Hemostasis A chieved: N/A 2 N/A Procedural Pain: N/A 1 N/A Post Procedural Pain: N/A Procedure was tolerated well N/A Debridement Treatment Response: N/A 0.3x1.3x0.2 N/A Post Debridement Measurements L x W x D (cm) N/A 0.061 N/A Post Debridement Volume: (cm) N/A Debridement N/A Procedures Performed: Wound Number: 17 N/A N/A Photos: N/A N/A Right, Lateral Foot N/A N/A Wound Location: Not Known N/A N/A Wounding Event: Abrasion N/A N/A Primary Etiology: Coronary Artery Disease, Deep Vein N/A N/A Comorbid History: Thrombosis, Hypertension, Myocardial Infarction, Peripheral Arterial Disease, Type II Diabetes 10/10/2021 N/A N/A Date Acquired: 1 N/A N/A Weeks of Treatment: Open N/A N/A Wound Status: No N/A N/A Wound Recurrence: 0.1x0.1x0.1 N/A N/A Measurements L x W x D (cm) 0.008 N/A N/A A (cm) : rea 0.001 N/A N/A Volume (cm) : 94.90% N/A N/A % Reduction in Area: 93.80% N/A N/A % Reduction in Volume: Full Thickness Without Exposed N/A N/A Classification: Support Structures Medium N/A N/A Exudate Amount: Serosanguineous N/A N/A Exudate Type: red, brown N/A N/A Exudate Color: N/A N/A N/A Wound Margin: Medium (34-66%) N/A N/A Granulation Amount: Red N/A N/A Granulation Quality: Medium (34-66%) N/A N/A Necrotic Amount: Fat Layer (Subcutaneous Tissue): Yes N/A N/A Exposed Structures: Fascia: No Tendon: No Muscle: No Joint: No Bone: No Small (1-33%) N/A N/A Epithelialization: Debridement - Selective/Open Wound N/A N/A Debridement: 10:00 N/A N/A Pre-procedure Verification/Time Out Taken: Other N/A  N/A Pain Control: Slough N/A N/A Tissue Debrided: Non-Viable Tissue N/A N/A Level: 0.01 N/A N/A Debridement A (sq cm): rea Curette N/A N/A Instrument: Minimum N/A N/A Bleeding: Pressure N/A N/A Hemostasis A chieved: 2 N/A N/A Procedural Pain: 1 N/A N/A Post Procedural Pain: Procedure was tolerated well N/A N/A Debridement Treatment Response: 0.1x0.1x0.1 N/A N/A Post Debridement Measurements L x W x D (cm) 0.001 N/A N/A Post Debridement Volume: (cm) Debridement N/A N/A Procedures Performed: Treatment Notes Electronic Signature(s) Signed: 10/18/2021 4:28:28 PM By: [name] MD Signed: 10/19/2021 5:37:29 PM By: Rhae Hammock RN Entered By: [name] on 10/18/2021 10:08:03 -------------------------------------------------------------------------------- Multi-Disciplinary Care Plan Details Patient Name: Date of Service: [name], [name] 10/18/2021 9:00 A M Medical Record Number: [id number] Patient Account Number: [id number] Date of Birth/Sex: Treating RN: [birth year] (81 y.o. F) [place],  Mechele Claude Primary Care Braydon Kullman: [name] Other Clinician: Referring Jaquane Boughner: Treating Teresia Myint/[name]: Tressie Stalker in Treatment: [place] reviewed with physician Active Inactive Nutrition Nursing Diagnoses: Impaired glucose control: actual or potential Potential for alteratiion in Nutrition/Potential for imbalanced nutrition Goals: Patient/caregiver agrees to and verbalizes understanding of need to use nutritional supplements and/or vitamins as prescribed Date Initiated: 06/15/2021 Date Inactivated: 09/20/2021 Target Resolution Date: 09/23/2021 Goal Status: Met Patient/caregiver will maintain therapeutic glucose control Date Initiated: 06/15/2021 Target Resolution Date: 11/17/2021 Goal Status: Active Interventions: Assess HgA1c results as ordered upon admission and as needed Assess patient nutrition  upon admission and as needed per policy Provide education on elevated blood sugars and impact on wound healing Provide education on nutrition Treatment Activities: Education provided on Nutrition : 06/15/2021 Notes: 09/20/21: Glucose control ongoing. Wound/Skin Impairment Nursing Diagnoses: Impaired tissue integrity Knowledge deficit related to ulceration/compromised skin integrity Goals: Patient/caregiver will verbalize understanding of skin care regimen Date Initiated: 06/15/2021 Target Resolution Date: 11/17/2021 Goal Status: Active Interventions: Assess patient/caregiver ability to obtain necessary supplies Assess patient/caregiver ability to perform ulcer/skin care regimen upon admission and as needed Assess ulceration(s) every visit Provide education on ulcer and skin care Notes: 09/20/21: Wound care regimen continues Electronic Signature(s) Signed: 10/18/2021 5:52:04 PM By: Dellie Catholic RN Entered By: Dellie Catholic on 10/18/2021 09:57:55 -------------------------------------------------------------------------------- Pain Assessment Details Patient Name: Date of Service: [name] 10/18/2021 9:00 A M Medical Record Number: [id number] Patient Account Number: [id number] Date of Birth/Sex: Treating RN: Aug 31, 1941 (81 y.o. [name] Primary Care Melburn Treiber: [name] Other Clinician: Referring Cozy Veale: Treating Antino Mayabb/[name]: Tressie Stalker in Treatment: 17 Active Problems Location of Pain Severity and Description of Pain Patient Has Paino No Site Locations Pain Management and Medication Current Pain Management: Electronic Signature(s) Signed: 10/18/2021 5:52:04 PM By: Dellie Catholic RN Entered By: Dellie Catholic on 10/18/2021 09:19:11 -------------------------------------------------------------------------------- Patient/Caregiver Education Details Patient Name: Date of Service: [name], [name]  1/25/2023andnbsp9:00 A M Medical Record Number: [id number] Patient Account Number: [id number] Date of Birth/Gender: Treating RN: 1941/01/08 (81 y.o. [name] Primary Care Physician: [name] Other Clinician: Referring Physician: Treating Physician/[name]: Tressie Stalker in Treatment: 17 Education Assessment Education Provided To: Patient Education Topics Provided Wound/Skin Impairment: Methods: Explain/Verbal Responses: Return demonstration correctly Electronic Signature(s) Signed: 10/18/2021 5:52:04 PM By: Dellie Catholic RN Entered By: Dellie Catholic on 10/18/2021 17:29:41 -------------------------------------------------------------------------------- Wound Assessment Details Patient Name: Date of Service: [name] 10/18/2021 9:00 A M Medical Record Number: [id number] Patient Account Number: [id number] Date of Birth/Sex: Treating RN: 03/31/1941 (81 y.o. [name] Primary Care Hadli Vandemark: [name] Other Clinician: Referring Shadoe Cryan: Treating Ozro Russett/[name]: Tressie Stalker in Treatment: 17 Wound Status Wound Number: 14 Primary Diabetic Wound/Ulcer of the Lower Extremity Etiology: Wound Location: Left, Posterior Calcaneus Wound Healed - Epithelialized Wounding Event: Pressure Injury Status: Date Acquired: 05/11/2021 Comorbid Coronary Artery Disease, Deep Vein Thrombosis, Hypertension, Weeks Of Treatment: 17 History: Myocardial Infarction, Peripheral Arterial Disease, Type II Diabetes Clustered Wound: No Photos Wound Measurements Length: (cm) Width: (cm) Depth: (cm) Area: (cm) Volume: (cm) 0 % Reduction in Area: 100% 0 % Reduction in Volume: 100% 0 Epithelialization: Large (67-100%) 0 0 Wound Description Classification: Grade 2 Wound Margin: Flat and Intact Exudate Amount: Medium Exudate Type: Serosanguineous Exudate Color: red, brown Foul Odor After  Cleansing: No Slough/Fibrino No Wound Bed Granulation Amount: None Present (0%) Exposed Structure Necrotic Amount: None Present (0%) Fascia Exposed:  No Fat Layer (Subcutaneous Tissue) Exposed: No Tendon Exposed: No Muscle Exposed: No Joint Exposed: No Bone Exposed: No Treatment Notes Wound #14 (Calcaneus) Wound Laterality: Left, Posterior Cleanser Peri-Wound Care Topical Primary Dressing Secondary Dressing Secured With Compression Wrap Compression Stockings Add-Ons Electronic Signature(s) Signed: 10/18/2021 5:52:04 PM By: Dellie Catholic RN Entered By: Dellie Catholic on 10/18/2021 09:45:26 -------------------------------------------------------------------------------- Wound Assessment Details Patient Name: Date of Service: [name] 10/18/2021 9:00 A M Medical Record Number: [id number] Patient Account Number: [id number] Date of Birth/Sex: Treating RN: 03-Jan-1941 (81 y.o. 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 Wound Open Wounding Event: Other Lesion Status: Date Acquired: 09/15/2021 Comorbid Coronary Artery Disease, Deep Vein Thrombosis, Hypertension, Weeks Of Treatment: 4 History: Myocardial Infarction, Peripheral Arterial Disease, Type II Diabetes Clustered Wound: No Photos Wound Measurements Length: (cm) 0.3 Width: (cm) 1.3 Depth: (cm) 0.2 Area: (cm) 0.306 Volume: (cm) 0.061 % Reduction in Area: 22.1% % Reduction in Volume: 48.3% Epithelialization: None Tunneling: No Undermining: No Wound Description Classification: Grade 2 Wound Margin: Distinct, outline attached Exudate Amount: Medium Exudate Type: Serosanguineous Exudate Color: red, brown Foul  Odor After Cleansing: No Slough/Fibrino Yes Wound Bed Granulation Amount: None Present (0%) Exposed Structure Necrotic Amount: Large (67-100%) Fascia Exposed: No Necrotic Quality: Adherent Slough Fat Layer (Subcutaneous Tissue) Exposed: Yes Tendon Exposed: No Muscle Exposed: No Joint Exposed: No Bone Exposed: No Treatment Notes 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 Peri-Wound Care Topical Primary Dressing Promogran Prisma Matrix, 4.34 (sq in) (silver collagen) Discharge Instruction: Moisten collagen with [place] Jelly Secondary Dressing Woven Gauze Sponges 2x2 in Discharge Instruction: Apply over primary dressing as directed. Secured With [employer], 4.5x3.1 (in/yd) Discharge Instruction: Secure with Kerlix as directed. Transpore Surgical Tape, 2x10 (in/yd) Discharge Instruction: Secure dressing with tape as directed. Netting Compression Wrap Compression Stockings Add-Ons Electronic Signature(s) Signed: 10/18/2021 5:52:04 PM By: Dellie Catholic RN Entered By: Dellie Catholic on 10/18/2021 09:48:56 -------------------------------------------------------------------------------- Wound Assessment Details Patient Name: Date of Service: [name] 10/18/2021 9:00 A M Medical Record Number: [id number] Patient Account Number: [id number] Date of Birth/Sex: Treating RN: December 09, 1940 (81 y.o. [name] Primary Care Sari Cogan: [name] Other Clinician: Referring Sevastian Witczak: Treating Ellouise Mcwhirter/[name]: Tressie Stalker in Treatment: 17 Wound Status Wound Number: 16 Primary Diabetic Wound/Ulcer of the Lower Extremity Etiology: Wound Location: Right, Plantar Foot Wound Open Wounding Event: Gradually Appeared Status: Date Acquired: 09/26/2021 Comorbid Coronary  Artery Disease, Deep Vein Thrombosis, Hypertension, Weeks Of Treatment: 3 History: Myocardial Infarction, Peripheral Arterial Disease, Type II Diabetes Clustered Wound: No Photos Wound Measurements Length: (cm) 0.1 Width: (cm) 0.1 Depth: (cm) 0.2 Area: (cm) 0.008 Volume: (cm) 0.002 % Reduction in Area: 93.2% % Reduction in Volume: 91.7% Epithelialization: None Tunneling: No Undermining: No Wound Description Classification: Grade 2 Wound Margin: Flat and Intact Exudate Amount: Medium Exudate Type: Serosanguineous Exudate Color: red, brown Foul Odor After Cleansing: No Slough/Fibrino Yes Wound Bed Granulation Amount: Small (1-33%) Exposed Structure Granulation Quality: Pink Fascia Exposed: No Necrotic Amount: Large (67-100%) Fat Layer (Subcutaneous Tissue) Exposed: Yes Tendon Exposed: No Muscle Exposed: No Joint Exposed: No Bone Exposed: No Treatment Notes Wound #16 (Foot) Wound Laterality:  Plantar, Right Cleanser Soap and Water Discharge Instruction: May shower and wash wound with dial antibacterial soap and water prior to dressing change. Peri-Wound Care Topical Primary Dressing Promogran Prisma Matrix, 4.34 (sq in) (silver collagen) Discharge Instruction: Moisten collagen with [place] Jelly Secondary Dressing Woven Gauze Sponges 2x2 in Discharge Instruction: Apply over primary dressing as directed. Secured With [employer], 4.5x3.1 (in/yd) Discharge Instruction: Secure with Kerlix as directed. Transpore Surgical Tape, 2x10 (in/yd) Discharge Instruction: Secure dressing with tape as directed. Netting Compression Wrap Compression Stockings Add-Ons Electronic Signature(s) Signed: 10/18/2021 5:52:04 PM By: Dellie Catholic RN Entered By: Dellie Catholic on 10/18/2021 09:53:11 -------------------------------------------------------------------------------- Wound Assessment Details Patient Name: Date of Service: [name] 10/18/2021 9:00 A  M Medical Record Number: [id number] Patient Account Number: [id number] Date of Birth/Sex: Treating RN: 07-Aug-1941 (81 y.o. [name] Primary Care Ulla Mckiernan: [name] Other Clinician: Referring Rolf Fells: Treating Arilla Hice/[name]: Tressie Stalker in Treatment: 17 Wound Status Wound Number: 17 Primary Abrasion Etiology: Wound Location: Right, Lateral Foot Wound Open Wounding Event: Not Known Status: Date Acquired: 10/10/2021 Comorbid Coronary Artery Disease, Deep Vein Thrombosis, Hypertension, Weeks Of Treatment: 1 History: Myocardial Infarction, Peripheral Arterial Disease, Type II Diabetes Clustered Wound: No Photos Wound Measurements Length: (cm) 0.1 Width: (cm) 0.1 Depth: (cm) 0.1 Area: (cm) 0.008 Volume: (cm) 0.001 % Reduction in Area: 94.9% % Reduction in Volume: 93.8% Epithelialization: Small (1-33%) Tunneling: No Undermining: No Wound Description Classification: Full Thickness Without Exposed Support Structu Exudate Amount: Medium Exudate Type: Serosanguineous Exudate Color: red, brown Wound Bed Granulation Amount: Medium (34-66%) Granulation Quality: Red Necrotic Amount: Medium (34-66%) Necrotic Quality: Adherent Slough res Foul Odor After Cleansing: No Slough/Fibrino Yes Exposed Structure Fascia Exposed: No Fat Layer (Subcutaneous Tissue) Exposed: Yes Tendon Exposed: No Muscle Exposed: No Joint Exposed: No Bone Exposed: No Treatment Notes Wound #17 (Foot) Wound Laterality: Right, Lateral Cleanser Peri-Wound Care Topical Primary Dressing Secondary Dressing Secured With Compression Wrap Compression Stockings Add-Ons Electronic Signature(s) Signed: 10/18/2021 5:52:04 PM By: Dellie Catholic RN Entered By: Dellie Catholic on 10/18/2021 09:44:56 -------------------------------------------------------------------------------- Vitals Details Patient Name: Date of Service: [name], [name] 10/18/2021  9:00 A M Medical Record Number: [id number] Patient Account Number: [id number] Date of Birth/Sex: Treating RN: 06-22-1941 (80 y.o. [name] Primary Care Joellyn Grandt: [name] Other Clinician: Referring Jecenia Leamer: Treating Breezy Hertenstein/[name]: Tressie Stalker in Treatment: 17 Vital Signs Time Taken: 09:18 Temperature (F): 97.7 Height (in): 64 Pulse (bpm): 75 Weight (lbs): 135 Respiratory Rate (breaths/min): 16 Body Mass Index (BMI): 23.2 Blood Pressure (mmHg): 150/69 Reference Range: 80 - 120 mg / dl Electronic Signature(s) Signed: 10/18/2021 5:52:04 PM By: Dellie Catholic RN Entered By: Dellie Catholic on 10/18/2021 09:19:00

## 2021-10-19 NOTE — Progress Notes (Addendum)
CHARDONNAY, HOLZMANN ([id number]) Visit Report for 10/18/2021 Debridement Details Patient Name: Date of Service: [name] 10/18/2021 9:00 A M Medical Record Number: [id number] Patient Account Number: [id number] Date of Birth/Sex: Treating RN: 1941-09-23 (81 y.o. [name], [name] Primary Care Provider: [name] Other Clinician: Referring Provider: Treating Provider/[name]: Tressie Stalker in Treatment: 17 Debridement Performed for Assessment: 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 Performed By: Physician Ricard Dillon., MD Debridement Type: Debridement Severity of Tissue Pre Debridement: Fat layer exposed Level of Consciousness (Pre-procedure): Awake and Alert Pre-procedure Verification/Time Out Yes - 10:00 Taken: Start Time: 10:00 Pain Control: Other : T Area Debrided (L x W): otal 0.3 (cm) x 1.3 (cm) = 0.39 (cm) Tissue and other material debrided: Non-Viable, Slough, Subcutaneous, Slough Level: Skin/Subcutaneous Tissue Debridement Description: Excisional Instrument: Curette Bleeding: Minimum Hemostasis Achieved: Pressure End Time: 10:02 Procedural Pain: 2 Post Procedural Pain: 1 Response to Treatment: Procedure was tolerated well Level of Consciousness (Post- Awake and Alert procedure): Post Debridement Measurements of Total Wound Length: (cm) 0.3 Width: (cm) 1.3 Depth: (cm) 0.2 Volume: (cm) 0.061 Character of Wound/Ulcer Post Debridement: Improved Severity of Tissue Post Debridement: Fat layer exposed Post Procedure Diagnosis Same as Pre-procedure Electronic Signature(s) Signed: 10/18/2021 4:28:28 PM By: [name] MD Signed: 10/19/2021 5:37:29 PM By: Rhae Hammock RN Entered By: [name] on 10/18/2021 10:15:50 -------------------------------------------------------------------------------- Debridement Details Patient Name: Date of Service: [name], [name] 10/18/2021 9:00 A M Medical  Record Number: [id number] Patient Account Number: [id number] Date of Birth/Sex: Treating RN: May 27, 1941 (81 y.o. [name], [name] Primary Care Provider: [name] Other Clinician: Referring Provider: Treating Provider/[name]: Tressie Stalker in Treatment: 17 Debridement Performed for Assessment: Wound #17 Right,Lateral Foot Performed By: Physician Ricard Dillon., MD Debridement Type: Debridement Level of Consciousness (Pre-procedure): Awake and Alert Pre-procedure Verification/Time Out Yes - 10:00 Taken: Start Time: 10:00 Pain Control: Other : T Area Debrided (L x W): otal 0.1 (cm) x 0.1 (cm) = 0.01 (cm) Tissue and other material debrided: Non-Viable, Slough, Subcutaneous, Slough Level: Skin/Subcutaneous Tissue Debridement Description: Excisional Instrument: Curette Bleeding: Minimum Hemostasis Achieved: Pressure End Time: 10:02 Procedural Pain: 2 Post Procedural Pain: 1 Response to Treatment: Procedure was tolerated well Level of Consciousness (Post- Awake and Alert procedure): Post Debridement Measurements of Total Wound Length: (cm) 0.1 Width: (cm) 0.1 Depth: (cm) 0.1 Volume: (cm) 0.001 Character of Wound/Ulcer Post Debridement: Improved Post Procedure Diagnosis Same as Pre-procedure Electronic Signature(s) Signed: 10/18/2021 4:28:28 PM By: [name] MD Signed: 10/19/2021 5:37:29 PM By: Rhae Hammock RN Entered By: [name] on 10/18/2021 10:16:01 -------------------------------------------------------------------------------- HPI Details Patient Name: Date of Service: [name], [name] 10/18/2021 9:00 A M Medical Record Number: [id number] Patient Account Number: [id number] Date of Birth/Sex: Treating RN: [birth year] (81 y.o. [name], [name] Primary Care Provider: [name] Other Clinician: Referring Provider: Treating Provider/[name]: Tressie Stalker in  Treatment: 61 History of Present Illness HPI Description: ADMISSION 07/13/2019 Patient is a 81 year old type II diabetic. She has known PAD. She has been followed by Dr. Jacqualyn Posey of podiatry for blistering areas on her toes dating back to 04/30/2019 which at this was the left fourth toe. By 8/11 she had wounds on the right and left second toes. She underwent arterial studies by Dr. Alvester Chou on 7/31 that showed ABIs on the right of 0.60 TBI of 0.26 on the left ABI of 0.56 and a TBI of 0.25. By 9/10 she had ischemic-looking wounds  per Dr. Earleen Newport on the right first, left first second and third. She has been using Medihoney and then mupirocin more recently simply Betadine. The patient underwent angiography by Dr. Gwenlyn Found on 9/21. 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 Noted that there was 1 vessel runoff in the foot via the peroneal. On the left there was an 80% left SFA, 70% mid left SFA. There was a short segment calcified CTO distal less than SFA and above-knee popliteal artery reconstituting with two-vessel runoff. 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 It was felt that she had bilateral SFA disease as well as tibial vessel disease. An attempt was made to revascularize the left SFA but they were unable to cross because of the highly calcified nature of the lesion. 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 She has an area on the medial left fourth toe with raised horned callus on top of this. I am not certain what this represents. With regards to pain she has about a 2-1/2 I will claudication tolerance in the grocery store. She has some pain in night which is relieved by putting her feet down over the side of the bed. She is wearing Nitro-Dur patches on the top  of her right foot. Past medical history includes type 2 diabetes with secondary PAD, neoplasm of the skin, diabetic retinopathy, carotid artery stenosis, hyperlipidemia hypertension. 11/2; the last time the patient was here I spoke to Dr. Gwenlyn Found about revascularization options on the right. As I understand things currently Dr. Gwenlyn Found spoke with Dr. Trula Slade and ultimately the patient was taken to surgery on 10/27. She had a right iliofemoral endarterectomy with a bovine patch angioplasty. I think the plan now is for her to have a staged intervention on the right SFA by Dr. Alvester Chou. Per the patient's understanding Dr. Gwenlyn Found and Dr. Jacqualyn Posey are waiting to see when the gangrenous toes on the right foot can be amputated. The patient states her pain is a lot better and she is grateful for that. 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 Small area on the left fourth toe. She is using Betadine to the mummified areas on the right and Medihoney on the left 08/27/2019; the last time I saw this patient I discharged her from the clinic. She had been revascularized by Dr. Trula Slade and she had a right iliofemoral endarterectomy with a bovine angioplasty. 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 Jacqualyn Posey of podiatry on 08/07/2019. I note that she also had a intervention by Dr. Gwenlyn Found and he performed a directional atherectomy and drug-coated balloon angioplasty of the SFA and popliteal artery on the right. I am not certain of the date of Dr. Kennon Holter procedure as of the time of this dictation. She was referred back to [place] by Dr. Earleen Newport predominantly for follow-up of the left fourth toe. She still has sutures and stitches in the right TMA site. She states her pain is a lot better. She expresses concern about the condition of the amputation site at the TMA. She is on doxycycline I think prescribed by Dr. Earleen Newport. She is complaining of some pain  at night 12/10; I spoke to Dr. Jacqualyn Posey last week.  He removed the sutures on the right foot on Monday of this week. She has the area on the left fourth toe just proximal to the PIP and then the right TMA site. She is still on doxycycline and has enough through next week. Unfortunately the TMA site does not look good at all. Both on the lateral and medial part of the incisions are areas that probe to bone. There is purulence over the medial part which I have cultured. We will use silver alginate. Left fourth toe looks somewhat better but there was still exposed bone 12/17; patient has an MRI booked for 12/30. Culture I did last week showed Pseudomonas Serratia and Enterococcus. This was purulent drainage coming out of the medial part of her amputation site. I use cefdinir 300 twice daily for 10 days that started on 12/15. Her x-ray on the right showed limited evaluation for osteomyelitis. The findings could have been postoperative. There was subtle erosion in the distal first and distal fifth metatarsal. An MRI was suggested. On the left she had irregularity of the fourth middle and proximal phalanx consistent with a history of osteomyelitis. I do not know that she has a history of osteomyelitis in this area. She had a newly defined area on the plantar third toe 12/31; patient's MRI is listed below: MRI OF THE RIGHT FOREFOOT WITHOUT AND WITH CONTRAST TECHNIQUE: Multiplanar, multisequence MR imaging of the right forefoot was performed before and after the administration of intravenous contrast. CONTRAST 6 mL GADAVIST IV SOLN : COMPARISON: Plain films right foot 09/04/2019. FINDINGS: Bones/Joint/Cartilage The patient is status post transmetatarsal amputation as seen on the prior exam. Marrow edema and enhancement are seen in all of the distal metatarsals. In the first metatarsal, signal change extends 1.5 cm proximal to the stump and in the second metatarsal extends approximately 2 cm proximal  to the stump. Edema and enhancement are seen in only the distal 0.7 cm of the third metatarsal stump and tips of the fourth and fifth metatarsals. Bone marrow signal is otherwise unremarkable. A small focus of subchondral edema is seen in the lateral talus, likely degenerative. Ligaments Intact. Muscles and Tendons No intramuscular fluid collection. Soft tissues Skin ulceration is seen off the stump of the first metatarsal. A thin fluid collection tracks deep to the wound and over the anterior metatarsals worrisome for small abscess. Intense subcutaneous edema and enhancement are seen diffusely. 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 Cellulitis about the foot. Skin ulceration over the distal first metatarsal is identified with a thin fluid collection tracking anteriorly along the stump worrisome for abscess. Small focus of subchondral edema in the lateral talus is likely degenerative. Electronically Signed By: Inge Rise M.D. On: 09/23/2019 15:25 Patient arrives in clinic today not complaining any of any pain. 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 She does not describe claudication however her activity is very limited. 10/01/2019; since the patient was last here I have communicated with Dr. Gwenlyn Found after bypass by Dr. Trula Slade and addressing the right superficial femoral artery he states that she is widely patent through peroneal arteries to the ankle with collaterals to the dorsalis pedis. He states he is going to talk to colleagues about the feasibility of tibial pedal access. The patient seems infectious disease later this afternoon Dr. Baxter Flattery. in preparation for this she  has been off antibiotics for 1 week and I went ahead and obtain pieces of the  remanence of her first metatarsal for pathology and CandS. The patient is a candidate for hyperbaric oxygen. 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 1/15; considerable improvement in the wounds on her feet. We are using silver collagen. She follows with Dr. Baxter Flattery of infectious disease and is on meropenem and daptomycin. She has been taught how to do this herself at home. Follows with Dr. Graylon Good at the end of treatment here. She has 2 wounds on the surgical TMA site 1 lateral and 1 medial the lateral 1 has regressed tremendously. The area medially still has some exposed bone although the base of this looks healthy. 1/22; 2 separate wounds on the surgical TMA site. Both of these look satisfactory. The medial area does not have any exposed bone. This is an improvement On the left side fourth toe dorsally over the proximal phalanx there is a deep punched out area that probes to bone. She has an area on the tip of the left third toe. She also tells [place] that in HBO she traumatized her left Achilles and this is left her with a superficial wound area 1/28; weekly visit along with HBO. She has 5 wounds. T our punched out areas on the original TMA site on the right. Both of these appear to have contracted. o The area on the right no longer has exposed bone. She has an area on the tip of the left third toe and on the DIP of the left fourth toe. Both of these had surface callus that I removed and unfortunately they have small areas that both go to bone. She has a traumatic wound on the left Achilles area that happened in HBO and that appears better. She is tolerating her IV antibiotics well at home. She has home health changing the dressings and she is doing it once on the weekends. We have been using silver collagen. She has been extensively revascularized on the right by Dr. Trula Slade and subsequently by Dr. Gwenlyn Found. According to her she has severe PAD on the left  but there was nothing that could be done to revascularize this I will need to review these notes 2/5; the patient will see Dr. Baxter Flattery of infectious disease on 2/17. Dr. Gwenlyn Found on 2/23 and according to her her antibiotics stopped on 2/23. She is tolerating hyperbarics well. She has made a tremendous improvement in the right forefoot with only 2 smaller open wounds. Using silver collagen. On the left foot she has the area on the tip of the left third toe and the medial part of the left fourth toe. These had exposed bone last week I did not sense any of that today 2/12; sees Dr. Graylon Good on 2/17 and Dr. Gwenlyn Found on 2/23. We are going to use Dermagraft on this today however the lateral part of her train TMA incision on the right is healed and the medial part is down so much that we just continue with the silver collagen She has wounds on the tip of her left third and the medial aspect of the left fourth. Both of these still have exposed bone I have not been able to get these to epithelialize. 2/19; she sees Dr. Gwenlyn Found on 2/23 and I have communicated with him about the left vascular supply. Looking at her angiogram from 08/03/2019 it looks as though that they could not cross the left CFA.  Noted that she had one-vessel runoff bilaterally. She also apparently saw Dr. Graylon Good although I did not look up these results for preparation of this record. 2/26; Dr. Gwenlyn Found is going to do an angiogram next week on Wednesday. I think they are going to try to go at this both retrograde and anterograde to see if anything can be done to revascularize the left lower limb. She continues to make nice progress on the remaining wound on the right medial foot on her TMA site and the toe it wounds on the left are responding nicely as well 3/12; the patient underwent an extensive and complicated revascularization on the left leg by Dr. Fletcher Anon on 3/3; she had an atherectomy on occluded left popliteal artery; anterior tibial artery followed  by a balloon angioplasty. Atherectomy was also performed to the left SFA because there was still significant 50% stenosis in the left popliteal artery they performed an intravascular lithotripsy which improved the residual stenosis to 20%. The same lithotripsy was used to dilate the proximal left SFA. She had a drug-eluting stent placed I believe in the SFA. The patient returned for hyperbarics this week. She has had some eye problems on the right which she tells me are secondary to diabetic retinopathy and she saw her eye doctor. She is really made excellent progress there is no open wounds on the left foot at all. She has one open area medially and her TMA site on the right lateral wound is closed. 3/19; the patient comes in today with the area on the medial transmet on the right improved. Some debris removed from the surface revealed the still open wound. Unfortunately she now has the open area on the third toe tip and the medial aspect of the dorsal fourth toe. These are in the same location as her previous wounds. These had actually closed up last week. 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 She is undergone complex revascularizations on the right by Dr. Trula Slade and Dr. Gwenlyn Found and I believe Dr. 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 She completed IVantibiotics as directed by infectious disease and I believe a course of oral antibiotics as well.she underwent a complete 40 treatments of hyperbarics. She comes in today with the vast majority of the right TMA site healed there is a small opening on the most medial aspect. Her left third and left fourth toes appear to have epithelialization although this  has fluctuated somewhat. 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 She had a gaping wound on the right transmetatarsal amputation L as well as deep wounds on the left third and left fourth toes. She underwent complex revascularizations on her bilateral lower legs which I described on her last visit. She completed IV antibiotics by infectious disease and 40 treatments of hyperbarics. Her left third and fourth toes are healed. The area on the right transmetatarsal amputation site is also closed today. READMISSION 08/04/2020 [name] is now a 81 year old woman that we had for a complex stay in our clinic from October 2000 14 January 2020. 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 Since she was last in clinic she had an MRI in June of  this year and underwent a CABG on 03/24/20. Her current problems with wounds began on October 30 when she developed a spontaneous opening over the right transmet medial aspect over the first metatarsal head. She has been using collagen on this that she had leftover from her last stay in this clinic. She also has had an open area on the medial right calf from a vein harvest site from her CABG she. She said that this is never really closed since the vein harvest was done. With regards to her arterial status this is a major issue. She had an angiogram by Dr. Gwenlyn Found in September 2020. She developed a gangrenous right first and second toes. Ultimately she was referred to Dr. Trula Slade who did a right common femoral endarterectomy with patch angioplasty. This was on 07/21/2019 and she really had a good result. Dr. Gwenlyn Found did a atherectomy followed by a drug coated balloon angioplasty of her right SFA popliteal and tibial peroneal trunk on 08/03/2019 again with excellent excellent results. Follow-up.Dopplers in November revealed revealed a widely patent SFA and tibioperoneal trunk. UNFORTUNATELY her recent angiogram that was done  on 08/01/2020 showed an 80% right common femoral artery stenosis just above the previous endarterectomy site occluded right right SFA at the origin reconstituting the adductor canal by the profunda femoris collaterals. The profunda femoris is also diffusely diseased. There is and again another 80% tibioperoneal trunk stenosis and one-vessel runoff via the peroneal artery. She is seen Dr. Donzetta Matters and she is scheduled for a femoral endarterectomy and right femoral-popliteal bypass grafting with endarterectomy of her tibial peroneal trunk. The scheduled surgery is on 11/23 The patient does not complain of pain at rest. She does have 5-minute walking claudication. The wounds on her right first met head remanent was spontaneous she does not think she hit this. As mentioned in the area on her right medial calf is a vein harvest site Dr. Gwenlyn Found had texted me about getting her in the clinic and we have arranged this as soon as we can. I have told her that we will have to see how successful Dr. Donzetta Matters is before we know what can be done about the wounds in a precise fashion. Fortunately there does not seem to be any obvious infection involving the right foot although that may need to be looked at if things really deteriorate. She was treated with IV antibiotics and hyperbaric oxygen for her previous osteomyelitis 12/2; the patient underwent a right femoral endarterectomy and bypass femoral to popliteal artery. This was done on 08/16/2020. She is done remarkably well. She has a small area on the right anterior tibia and a small area in the medial part of her original right transmetatarsal amputation. She appears to have had an excellent response to surgery. 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 Stantz is a now 81 year old woman who arrives for review of wounds on the left TMA site  extending into her plantar foot as well as an area on the left posterior calcaneus. 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 She was extensively revascularized on the right at that time as noted above in our previous notes ended up receiving IV antibiotics and hyperbaric oxygen therapy. Miraculously this TMA site actually closed and she really has had not any trouble with this  since. UNFORTUNATELY the same cannot be said of her left side. Her problem apparently started sometime in July when she hit her left third toe on a barstool while playing pool. She developed an acute infection which eventually led to underlying osteomyelitis. She underwent an extensive hospitalization from 04/27/2021 through 05/15/2021. She received IV antibiotics. Also extensive intervention by Dr. Stanford Breed of vein and vascular which included a left common femoral to tibioperoneal trunk bypass as well as a left third toe amputation on 05/03/2021. Unfortunately the area did not heal she ended up requiring a left TMA. By her description this was left open. She has been using a wound VAC on this to earlier this week and then was switched to Santyl. I think because of the left forefoot off loader she is also developed an area on the left posterior calcaneus. She is not complaining of a lot of pain. She has her daughter at home to change the dressings. She has been prescribed Santyl and she has a tube that she is brought into our clinic. I cannot get a lot of history of claudication although she is really not that mobile at this point walking with a walker. 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 She has had multiple interventions on the right side by vein and vascular and also by Dr. Gwenlyn Found as well as a  transmetatarsal amputation on the right. She has had follow-up noninvasive studies on 06/06/2021; on the left that showed an ABI of 0.47 with monophasic waveforms a dorsalis pedis ABI of 0.40 with monophasic waveforms. Her ANGIOGRAM which was initially done by Dr. Oneida Alar on 04/28/2021 showed severe left foot superficial femoral artery occlusive disease and an occlusion of the left popliteal artery with two-vessel runoff to the left foot with moderate to severe disease below the knee popliteal artery and tibial disease. She had 80% distal right external iliac artery above the existing femoral above-knee popliteal bypass but she does not have an open wound on the right foot She arrives in clinic with an extensive open area on the TMA site which in the mid aspect extends into the plantar foot. She also has a more superficial area on the upper part of her Achilles area of the left heel 9/29; patient arrives in clinic today with slough over the TMA site. We use MolecuLight to look at the surface of this suggesting cyan and white discoloration over a large part of this wound also extending into the third metatarsal area. She also has the area on the left posterior calcaneus. This is slough covered we will switch to [facility] here as well. There is some warmth in the area and some erythema we will give her antibiotics as well 10/6; completely necrotic surface once again on the left TMA site. We have been using Santyl and Hydrofera Blue. She has an area on the left posterior Achilles and actually states this has been more painful this week indeed there is some erythema around this area. She also describes I think claudication at rest which is relieved by dipping her left leg over the side of the bed at night. 10/13; our intake nurse was able to brush some slough off the surface of the left TMA hence this did not require debridement that is better than last week. The area heading towards the plantar part of her  foot required an aggressive debridement  to clean this up. This is quite a sizable divot but does not go to bone. The area on the right Achilles heel is also covered in a high 100% nonviable tissue we have been using Santyl and silver alginate 10/20; the area on the left TMA looks much better. Even the area is spreading into the midfoot looks improved. The right Achilles still is covered and nonviable surface 100%. We have been using Santyl and silver alginate and this will continue for this week The patient is approved for 5 Apligraf's and I think she is good enough to start using these next week 10/27; the patient's wounds actually look quite good. We have been using Santyl and silver alginate. We applied Apligraf #1 today Saw Dr. Stanford Breed her vascular surgeon on 10/25 he told her TMA was healing nicely. She will follow-up with them in December to repeat noninvasive testing. 08/16/2021 upon evaluation today patient appears to be doing much better in regard to her wounds. Both are showing signs of improvement which is great news. I am going to perform some debridement to clear away some of the necrotic debris currently this will just be a light debridement. Nonetheless other than that I feel like that the Apligraf is doing quite well working and reapply today and this will be #3 as far as the application is concerned. 12/20; since the patient was last here she was seen by Dr. Stanford Breed who felt that her Doppler flow was sluggish in the bypass. She therefore went underwent a urgent angiogram on 09/07/2021 by Dr. Carlis Abbott. She had an angioplasty of the distal anastomosis and the anterior tibial artery. She tolerated this well. She had no flow-limiting stenosis in the aortoiliac segment on the left. Some calcification in the left common iliac artery but it was not flow-limiting the common femoral and profunda were patent. The left lower extremity bypass had brisk flow with no significant proximal anastomosis on  the common femoral artery. Distally there was a high-grade stenosis where the bypass anastomosis was performed. This was angioplastied as well as the anterior tibial artery. 12/28; the patient comes in early because of a wound over the tip of her right first metatarsal head. She is not really sure how this happened. Says she noticed it when she took her sock off on Friday before she got in the shower. She has been using Hydrofera Blue to this area. This is the original wounded site we dealt with in the setting of a previous right TMA. The patient has PA I researched this on [place] link. As it turns out she has had recent arterial studies noninvasive on 09/05/2021. On the right she had dampened monophasic waveforms at the popliteal absent waveforms at the PTA and dorsalis pedis. An ABI could not be obtained previously at 0.47. The comment states that they were unable to detect Doppler waveforms of the posterior tibial and dorsalis pedis arteries. 2023 09/26/2021; her original left TMA wound is almost closed. I reapplied Apligraf to the remaining area. The area on the plantar foot is also closed as far as I can tell HOWEVER she comes in with a second wound on the right first metatarsal head remanent both of these probe to bone. I did a bone scraping of this last week which only showed Corynebacterium unlikely to be a true pathogen. Nevertheless I am going to put her on Augmentin today probably for 2 weeks. X-ray will be done today. She has an appointment with Dr. Gwenlyn Saran at vein and  vascular next Tuesday I will send him a note about my concerns about the right TM 1/10; we inadvertently remove the Apligraf from the left surgical site however everything looks closed here. She still has 2 probing wounds over the remanent of her first metatarsal head both go to bone although there is less erythema and swelling in the area. She does not have an appointment with Dr. Donzetta Matters until next Tuesday. I am going to go  ahead and order an MRI of the right foot I had originally planned to have or at least consider HBO if the patient has underlying osteomyelitis in the right foot however she tells me that she lost a significant amount of her visual field during her last run with HBO in the right eye nasal aspect. She saw Dr. Zadie Rhine who is a retinal specialist I will see if I can look at his records. She is still on Augmentin as finishes on Monday. 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 She has been on Augmentin with some improvement in the surrounding erythema although the small wound on the tip and the plantar aspect still probe easily to bone and there does not appear to be any viable tissue. UNFORTUNATELY arrives in clinic today with new areas on the right lateral foot neither 1 of these look particularly healthy. No debridement. The patient has an appoint with Dr. Donzetta Matters this morning my question is angiography of the right lego. I also wonder about an infectious disease appointment and I will continue her Augmentin but refer her for consideration. I did not get a culture that was positive even from a bone scraping however. 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 She saw her retina doctor Dr. Deloria Lair and her interpretation of this was that the hyperbarics may have contributed to a blockage. I wonder whether this was a central retinal vein or artery occlusion. I am not sure that Dr. Zadie Rhine documents in epic I will need to see if I can find his note 1/25; this is [place] returns to clinic having a very eventful week. Firstly  she underwent angiography by Dr. Stanford Breed of vein and vascular.. Her preintervention ABI was 0. She underwent a right iliofemoral drug-coated balloon angioplasty, right popliteal drug-coated balloon angioplasty and a right tibial peroneal trunk and peroneal angioplasty by Dr. Stanford Breed. She tolerated this well. She definitely has better blood flow to the right foot. She saw Dr. Doren Custard in follow-up who felt her Doppler flow in the right foot was good. An x-ray did not show any evidence of osteomyelitis however I noted a previous MRI certainly did. She also saw infectious diseases been started on IV daptomycin and ertapenem. She had the PICC line placed. I had started her on Augmentin on February 3 and that had some improvement in the erythema and swelling over the remanent of her first metatarsal head. As far as I am aware she did not have a specific organism cultured at least not by me although I did try. Everything on the left is closed including the left transmit amputation site and the area on her left posterior  heel/Achilles. We are using silver collagen on the right foot now which has 2 open wounds on the right first metatarsal head and the right fifth metatarsal head Electronic Signature(s) Signed: 10/18/2021 4:28:28 PM By: [name] MD Entered By: [name] on 10/18/2021 10:12:12 -------------------------------------------------------------------------------- Physical Exam Details Patient Name: Date of Service: [name], [name] 10/18/2021 9:00 A M Medical Record Number: [id number] Patient Account Number: [id number] Date of Birth/Sex: Treating RN: November 08, 1940 (81 y.o. Benjaman Lobe Primary Care Provider: [name] Other Clinician: Referring Provider: Treating Provider/[name]: Tressie Stalker in Treatment: 60 Constitutional Patient is hypertensive.. Pulse regular and within target range for patient.Marland Kitchen Respirations regular, non-labored and  within target range.. Temperature is normal and within the target range for the patient.Marland Kitchen Appears in no distress. Notes Wound exam; the area on the left transmit and the left Achilles are both healed. The secondary areas which were also ischemic on the right first metatarsal head remanent x2 and on the lateral aspect probably her fifth metatarsal remnant x2 as well. I debrided the larger of the area over the first metatarsal head as well as into eschared areas laterally. Noteworthy that her peripheral pulses were faintly palpable in the right foot today including the posterior tibia and dorsalis pedis. The right first metatarsal head looks a lot better degree of erythema is considerably better Electronic Signature(s) Signed: 10/18/2021 4:28:28 PM By: [name] MD Entered By: [name] on 10/18/2021 10:13:29 -------------------------------------------------------------------------------- Physician Orders Details Patient Name: Date of Service: [name], [place]. 10/18/2021 9:00 A M Medical Record Number: [id number] Patient Account Number: [id number] Date of Birth/Sex: Treating RN: 10-Feb-1941 (81 y.o. [name] Primary Care Provider: [name] Other Clinician: Referring Provider: Treating Provider/[name]: Tressie Stalker in Treatment: [phone] Verbal / Phone Orders: No Diagnosis Coding Follow-up Appointments ppointment in 1 week. - with Dr. Dellia Nims Return A Bathing/ Shower/ Hygiene May shower with protection but do not get wound dressing(s) wet. Edema Control - Lymphedema / SCD / Other Elevate legs to the level of the heart or above for 30 minutes daily and/or when sitting, a frequency of: - throughout the day Avoid standing for long periods of time. Home Health New wound care orders this week; continue Home Health for wound care. May utilize formulary equivalent dressing for wound treatment orders unless otherwise specified. Dressing  changes to be completed by [place] on Monday / Wednesday / Friday except when patient has scheduled visit at [facility]. Other Home Health Orders/Instructions: - [facility] Wound Treatment Wound #15 - Amputation Site - Transmetatarsal Wound Laterality: Right Cleanser: Soap and Water [facility]) Every Other Day/30 Days Discharge Instructions: May shower and wash wound with dial antibacterial soap and water prior to dressing change. Prim Dressing: Promogran Prisma Matrix, 4.34 (sq in) (silver collagen) (Home Health) Every Other Day/30 Days ary Discharge Instructions: Moisten collagen with [place] Jelly Secondary Dressing: Woven Gauze Sponges 2x2 in [facility]) Every Other Day/30 Days Discharge Instructions: Apply over primary dressing as directed. Secured With: [employer], 4.5x3.1 (in/yd) [facility]) Every Other Day/30 Days Discharge Instructions: Secure with Kerlix as directed. Secured With: Transpore Surgical Tape, 2x10 (in/yd) [facility]) Every Other Day/30 Days Discharge Instructions: Secure dressing with tape as directed. Secured With: Netting Every Other Day/30 Days Wound #16 - Foot Wound Laterality: Plantar, Right Cleanser: Soap and Water [facility]) Every Other Day/30 Days Discharge Instructions: May shower and wash wound with dial antibacterial soap and water prior  to dressing change. Prim Dressing: Promogran Prisma Matrix, 4.34 (sq in) (silver collagen) (Home Health) Every Other Day/30 Days ary Discharge Instructions: Moisten collagen with [place] Jelly Secondary Dressing: Woven Gauze Sponges 2x2 in [facility]) Every Other Day/30 Days Discharge Instructions: Apply over primary dressing as directed. Secured With: [employer], 4.5x3.1 (in/yd) [facility]) Every Other Day/30 Days Discharge Instructions: Secure with Kerlix as directed. Secured With: Transpore Surgical Tape, 2x10 (in/yd) [facility]) Every Other Day/30 Days Discharge Instructions: Secure  dressing with tape as directed. Secured With: Netting Every Other Day/30 Days Electronic Signature(s) Signed: 10/18/2021 4:28:28 PM By: [name] MD Signed: 10/18/2021 5:52:04 PM By: Dellie Catholic RN Entered By: Dellie Catholic on 10/18/2021 10:10:20 -------------------------------------------------------------------------------- Problem List Details Patient Name: Date of Service: [name], [place]. 10/18/2021 9:00 A M Medical Record Number: [id number] Patient Account Number: [id number] Date of Birth/Sex: Treating RN: [birth year] (81 y.o. [name], [name] Primary Care Provider: [name] Other Clinician: Referring Provider: Treating Provider/[name]: Tressie Stalker in Treatment: 17 Active Problems ICD-10 Encounter Code Description Active Date MDM Diagnosis E11.621 Type 2 diabetes mellitus with foot ulcer 06/15/2021 No Yes T81.31XD Disruption of external operation (surgical) wound, not elsewhere classified, 06/15/2021 No Yes subsequent encounter L97.514 Non-pressure chronic ulcer of other part of right foot with necrosis of bone 09/20/2021 No Yes L97.428 Non-pressure chronic ulcer of left heel and midfoot with other specified 06/15/2021 No Yes severity E11.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 06/15/2021 No Yes Inactive Problems ICD-10 Code Description Active Date Inactive Date L97.528 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ity 06/15/2021 06/15/2021 Resolved Problems Electronic Signature(s) Signed: 10/18/2021 4:28:28 PM By: [name] MD Entered By: [name] on 10/18/2021 10:07:54 -------------------------------------------------------------------------------- Progress Note Details Patient Name: Date of Service: [name], [name] 10/18/2021 9:00 A M Medical Record Number: [id number] Patient Account Number: [id number] Date of Birth/Sex: Treating  RN: [birth year] (81 y.o. [name], [name] Primary Care Provider: [name] Other Clinician: Referring Provider: Treating Provider/[name]: Tressie Stalker in Treatment: 17 Subjective History of Present Illness (HPI) ADMISSION 07/13/2019 Patient is a 81 year old type II diabetic. She has known PAD. She has been followed by Dr. Jacqualyn Posey of podiatry for blistering areas on her toes dating back to 04/30/2019 which at this was the left fourth toe. By 8/11 she had wounds on the right and left second toes. She underwent arterial studies by Dr. Alvester Chou on 7/31 that showed ABIs on the right of 0.60 TBI of 0.26 on the left ABI of 0.56 and a TBI of 0.25. By 9/10 she had ischemic-looking wounds per Dr. Earleen Newport on the right first, left first second and third. She has been using Medihoney and then mupirocin more recently simply Betadine. The patient underwent angiography by Dr. Gwenlyn Found on 9/21. 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 Noted that there was 1 vessel runoff in the foot via the peroneal. ooOn the left there was an 80% left SFA, 70% mid left SFA. There was a short segment calcified CTO distal less than SFA and above-knee popliteal artery reconstituting with two-vessel runoff. 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 It was felt that she had bilateral SFA disease as well as tibial vessel disease. An attempt was made to revascularize the left SFA but they were unable to cross because  of the highly calcified nature of the lesion. 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 She has an area on the medial left fourth toe with raised horned callus on top of this. I am not certain what this represents. With regards to pain she  has about a 2-1/2 I will claudication tolerance in the grocery store. She has some pain in night which is relieved by putting her feet down over the side of the bed. She is wearing Nitro-Dur patches on the top of her right foot. Past medical history includes type 2 diabetes with secondary PAD, neoplasm of the skin, diabetic retinopathy, carotid artery stenosis, hyperlipidemia hypertension. 11/2; the last time the patient was here I spoke to Dr. Gwenlyn Found about revascularization options on the right. As I understand things currently Dr. Gwenlyn Found spoke with Dr. Trula Slade and ultimately the patient was taken to surgery on 10/27. She had a right iliofemoral endarterectomy with a bovine patch angioplasty. I think the plan now is for her to have a staged intervention on the right SFA by Dr. Alvester Chou. Per the patient's understanding Dr. Gwenlyn Found and Dr. Jacqualyn Posey are waiting to see when the gangrenous toes on the right foot can be amputated. The patient states her pain is a lot better and she is grateful for that. 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 Small area on the left fourth toe. She is using Betadine to the mummified areas on the right and Medihoney on the left 08/27/2019; the last time I saw this patient I discharged her from the clinic. She had been revascularized by Dr. Trula Slade and she had a right iliofemoral endarterectomy with a bovine angioplasty. 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 Jacqualyn Posey of podiatry on 08/07/2019. I note that she also had a intervention by Dr. Gwenlyn Found and he performed a directional atherectomy and drug-coated balloon angioplasty of the SFA and popliteal artery on the right. I am not certain of the date of Dr. Kennon Holter procedure as of the time of this dictation. She was referred back to [place] by Dr. Earleen Newport predominantly for follow-up of the left fourth toe. She still has sutures and stitches in the right  TMA site. She states her pain is a lot better. She expresses concern about the condition of the amputation site at the TMA. She is on doxycycline I think prescribed by Dr. Earleen Newport. She is complaining of some pain at night 12/10; I spoke to Dr. Jacqualyn Posey last week. He removed the sutures on the right foot on Monday of this week. She has the area on the left fourth toe just proximal to the PIP and then the right TMA site. She is still on doxycycline and has enough through next week. Unfortunately the TMA site does not look good at all. Both on the lateral and medial part of the incisions are areas that probe to bone. There is purulence over the medial part which I have cultured. We will use silver alginate. Left fourth toe looks somewhat better but there was still exposed bone 12/17; patient has an MRI booked for 12/30. Culture I did last week showed Pseudomonas Serratia and Enterococcus. This was purulent drainage coming out of the medial part of her amputation site. I use cefdinir 300 twice daily for 10 days that started on 12/15.  Her x-ray on the right showed limited evaluation for osteomyelitis. The findings could have been postoperative. There was subtle erosion in the distal first and distal fifth metatarsal. An MRI was suggested. On the left she had irregularity of the fourth middle and proximal phalanx consistent with a history of osteomyelitis. I do not know that she has a history of osteomyelitis in this area. She had a newly defined area on the plantar third toe 12/31; patient's MRI is listed below: MRI OF THE RIGHT FOREFOOT WITHOUT AND WITH CONTRAST TECHNIQUE: Multiplanar, multisequence MR imaging of the right forefoot was performed before and after the administration of intravenous contrast. CONTRAST 6 mL GADAVIST IV SOLN : COMPARISON: Plain films right foot 09/04/2019. FINDINGS: Bones/Joint/Cartilage The patient is status post transmetatarsal amputation as seen on the prior exam.  Marrow edema and enhancement are seen in all of the distal metatarsals. In the first metatarsal, signal change extends 1.5 cm proximal to the stump and in the second metatarsal extends approximately 2 cm proximal to the stump. Edema and enhancement are seen in only the distal 0.7 cm of the third metatarsal stump and tips of the fourth and fifth metatarsals. Bone marrow signal is otherwise unremarkable. A small focus of subchondral edema is seen in the lateral talus, likely degenerative. Ligaments Intact. Muscles and Tendons No intramuscular fluid collection. Soft tissues Skin ulceration is seen off the stump of the first metatarsal. A thin fluid collection tracks deep to the wound and over the anterior metatarsals worrisome for small abscess. Intense subcutaneous edema and enhancement are seen diffusely. 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 Cellulitis about the foot. Skin ulceration over the distal first metatarsal is identified with a thin fluid collection tracking anteriorly along the stump worrisome for abscess. Small focus of subchondral edema in the lateral talus is likely degenerative. Electronically Signed By: Inge Rise M.D. On: 09/23/2019 15:25 Patient arrives in clinic today not complaining any of any pain. 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 She does not describe claudication however her activity is very limited. 10/01/2019; since the patient was last here I have communicated with Dr. Gwenlyn Found after bypass by Dr. Trula Slade and addressing the right superficial femoral artery he states that she is widely patent through peroneal arteries to the ankle with collaterals to the dorsalis pedis. He states he is going to talk to colleagues about the  feasibility of tibial pedal access. The patient seems infectious disease later this afternoon Dr. Baxter Flattery. in preparation for this she has been off antibiotics for 1 week and I went ahead and obtain pieces of the remanence of her first metatarsal for pathology and CandS. The patient is a candidate for hyperbaric oxygen. 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 1/15; considerable improvement in the wounds on her feet. We are using silver collagen. She follows with Dr. Baxter Flattery of infectious disease and is on meropenem and daptomycin. She has been taught how to do this herself at home. Follows with Dr. Graylon Good at the end of treatment here. She has 2 wounds on the surgical TMA site 1 lateral and 1 medial the lateral 1 has regressed tremendously. The area medially still has some exposed bone although the base of this looks healthy. 1/22; 2 separate wounds on the surgical TMA site. Both of  these look satisfactory. The medial area does not have any exposed bone. This is an improvement On the left side fourth toe dorsally over the proximal phalanx there is a deep punched out area that probes to bone. She has an area on the tip of the left third toe. She also tells [place] that in HBO she traumatized her left Achilles and this is left her with a superficial wound area 1/28; weekly visit along with HBO. She has 5 wounds. T our punched out areas on the original TMA site on the right. Both of these appear to have contracted. o The area on the right no longer has exposed bone. She has an area on the tip of the left third toe and on the DIP of the left fourth toe. Both of these had surface callus that I removed and unfortunately they have small areas that both go to bone. She has a traumatic wound on the left Achilles area that happened in HBO and that appears better. She is tolerating her IV antibiotics well at home. She has home health changing the dressings and she is doing  it once on the weekends. We have been using silver collagen. She has been extensively revascularized on the right by Dr. Trula Slade and subsequently by Dr. Gwenlyn Found. 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 Baxter Flattery of infectious disease on 2/17. Dr. Gwenlyn Found on 2/23 and according to her her antibiotics stopped on 2/23. She is tolerating hyperbarics well. She has made a tremendous improvement in the right forefoot with only 2 smaller open wounds. Using silver collagen. On the left foot she has the area on the tip of the left third toe and the medial part of the left fourth toe. These had exposed bone last week I did not sense any of that today 2/12; sees Dr. Graylon Good on 2/17 and Dr. Gwenlyn Found on 2/23. We are going to use Dermagraft on this today however the lateral part of her train TMA incision on the right is healed and the medial part is down so much that we just continue with the silver collagen She has wounds on the tip of her left third and the medial aspect of the left fourth. Both of these still have exposed bone I have not been able to get these to epithelialize. 2/19; she sees Dr. Gwenlyn Found on 2/23 and I have communicated with him about the left vascular supply. Looking at her angiogram from 08/03/2019 it looks as though that they could not cross the left CFA. Noted that she had one-vessel runoff bilaterally. She also apparently saw Dr. Graylon Good although I did not look up these results for preparation of this record. 2/26; Dr. Gwenlyn Found is going to do an angiogram next week on Wednesday. I think they are going to try to go at this both retrograde and anterograde to see if anything can be done to revascularize the left lower limb. She continues to make nice progress on the remaining wound on the right medial foot on her TMA site and the toe it wounds on the left are responding nicely as  well 3/12; the patient underwent an extensive and complicated revascularization on the left leg by Dr. Fletcher Anon on 3/3; she had an atherectomy on occluded left popliteal artery; anterior tibial artery followed by a balloon angioplasty. Atherectomy was also performed to the left SFA  because there was still significant 50% stenosis in the left popliteal artery they performed an intravascular lithotripsy which improved the residual stenosis to 20%. The same lithotripsy was used to dilate the proximal left SFA. She had a drug-eluting stent placed I believe in the SFA. The patient returned for hyperbarics this week. She has had some eye problems on the right which she tells me are secondary to diabetic retinopathy and she saw her eye doctor. She is really made excellent progress there is no open wounds on the left foot at all. She has one open area medially and her TMA site on the right lateral wound is closed. 3/19; the patient comes in today with the area on the medial transmet on the right improved. Some debris removed from the surface revealed the still open wound. ooUnfortunately she now has the open area on the third toe tip and the medial aspect of the dorsal fourth toe. These are in the same location as her previous wounds. These had actually closed up last week. 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 She is undergone complex revascularizations on the right by Dr. Trula Slade and Dr. Gwenlyn Found and I believe Dr. 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 She completed IVantibiotics as directed by infectious disease and I believe a course of oral antibiotics as well.she underwent a complete 40 treatments of  hyperbarics. She comes in today with the vast majority of the right TMA site healed there is a small opening on the most medial aspect. Her left third and left fourth toes appear to have epithelialization although this has fluctuated somewhat. 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 She had a gaping wound on the right transmetatarsal amputation L as well as deep wounds on the left third and left fourth toes. She underwent complex revascularizations on her bilateral lower legs which I described on her last visit. She completed IV antibiotics by infectious disease and 40 treatments of hyperbarics. Her left third and fourth toes are healed. The area on the right transmetatarsal amputation site is also closed today. READMISSION 08/04/2020 [name] is now a 81 year old woman that we had for a complex stay in our clinic from October 2000 14 January 2020. 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 Her current problems with wounds began on October 30 when she developed a spontaneous opening over the right transmet medial aspect over the first metatarsal head. She has been using collagen on this that she had leftover from her last stay in this clinic. She also has had an open area on the medial right calf from a vein harvest site from her CABG she. She said that this is never really closed since the vein harvest was done. With regards to her arterial status this is a major issue. She had an angiogram by Dr. Gwenlyn Found in September 2020. She developed a gangrenous right first and second toes. Ultimately she was referred to Dr. Trula Slade who did a right common femoral endarterectomy with patch angioplasty. This was on 07/21/2019 and she really had a  good result. Dr. Gwenlyn Found did a atherectomy followed by a drug coated balloon angioplasty of her right SFA popliteal and  tibial peroneal trunk on 08/03/2019 again with excellent excellent results. Follow-up.Dopplers in November revealed revealed a widely patent SFA and tibioperoneal trunk. UNFORTUNATELY her recent angiogram that was done on 08/01/2020 showed an 80% right common femoral artery stenosis just above the previous endarterectomy site occluded right right SFA at the origin reconstituting the adductor canal by the profunda femoris collaterals. The profunda femoris is also diffusely diseased. There is and again another 80% tibioperoneal trunk stenosis and one-vessel runoff via the peroneal artery. She is seen Dr. Donzetta Matters and she is scheduled for a femoral endarterectomy and right femoral-popliteal bypass grafting with endarterectomy of her tibial peroneal trunk. The scheduled surgery is on 11/23 The patient does not complain of pain at rest. She does have 5-minute walking claudication. The wounds on her right first met head remanent was spontaneous she does not think she hit this. As mentioned in the area on her right medial calf is a vein harvest site Dr. Gwenlyn Found had texted me about getting her in the clinic and we have arranged this as soon as we can. I have told her that we will have to see how successful Dr. Donzetta Matters is before we know what can be done about the wounds in a precise fashion. Fortunately there does not seem to be any obvious infection involving the right foot although that may need to be looked at if things really deteriorate. She was treated with IV antibiotics and hyperbaric oxygen for her previous osteomyelitis 12/2; the patient underwent a right femoral endarterectomy and bypass femoral to popliteal artery. This was done on 08/16/2020. She is done remarkably well. She has a small area on the right anterior tibia and a small area in the medial part of her original right transmetatarsal amputation. She appears to have had an excellent response to surgery. 12/16; the patient's area on the right tibia  and the small area on the medial part of her original right transmetatarsal amputation is totally healed READMISSION 06/15/2021 Mrs. Wire is a now 81 year old woman who arrives for review of wounds on the left TMA site extending into her plantar foot as well as an area on the left posterior calcaneus. 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 She was extensively revascularized on the right at that time as noted above in our previous notes ended up receiving IV antibiotics and hyperbaric oxygen therapy. Miraculously this TMA site actually closed and she really has had not any trouble with this since. UNFORTUNATELY the same cannot be said of her left side. Her problem apparently started sometime in July when she hit her left third toe on a barstool while playing pool. She developed an acute infection which eventually led to underlying osteomyelitis. She underwent an extensive hospitalization from 04/27/2021 through 05/15/2021. She received IV antibiotics. Also extensive intervention by Dr. Stanford Breed of vein and vascular which included a left common femoral to tibioperoneal trunk bypass as well as a left third toe amputation on 05/03/2021. Unfortunately the area did not heal she ended up requiring a left TMA. By her description this was left open. She has been using a wound VAC on this to earlier this week and then was switched to Santyl. I think because of the left forefoot off loader she is also developed an area on the left posterior  calcaneus. She is not complaining of a lot of pain. She has her daughter at home to change the dressings. She has been prescribed Santyl and she has a tube that she is brought into our clinic. I cannot get a lot of history of claudication although she is really not that mobile at this point walking with a walker. 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 She has had multiple interventions on the right side by vein and vascular and also by Dr. Gwenlyn Found as well as a transmetatarsal amputation on the right. She has had follow-up noninvasive studies on 06/06/2021; on the left that showed an ABI of 0.47 with monophasic waveforms a dorsalis pedis ABI of 0.40 with monophasic waveforms. Her ANGIOGRAM which was initially done by Dr. Oneida Alar on 04/28/2021 showed severe left foot superficial femoral artery occlusive disease and an occlusion of the left popliteal artery with two-vessel runoff to the left foot with moderate to severe disease below the knee popliteal artery and tibial disease. She had 80% distal right external iliac artery above the existing femoral above-knee popliteal bypass but she does not have an open wound on the right foot She arrives in clinic with an extensive open area on the TMA site which in the mid aspect extends into the plantar foot. She also has a more superficial area on the upper part of her Achilles area of the left heel 9/29; patient arrives in clinic today with slough over the TMA site. We use MolecuLight to look at the surface of this suggesting cyan and white discoloration over a large part of this wound also extending into the third metatarsal area. She also has the area on the left posterior calcaneus. This is slough covered we will switch to [facility] here as well. There is some warmth in the area and some erythema we will give her antibiotics as well 10/6; completely necrotic surface once again on the left TMA site. We have been using Santyl and Hydrofera Blue. She has an area on the left posterior Achilles and actually states this has been more painful this week indeed there is some erythema around this area. She also describes I think claudication at rest which is relieved by dipping her left leg over the side of the bed  at night. 10/13; our intake nurse was able to brush some slough off the surface of the left TMA hence this did not require debridement that is better than last week. The area heading towards the plantar part of her foot required an aggressive debridement to clean this up. This is quite a sizable divot but does not go to bone. The area on the right Achilles heel is also covered in a high 100% nonviable tissue we have been using Santyl and silver alginate 10/20; the area on the left TMA looks much better. Even the area is spreading into the midfoot looks improved. The right Achilles still is covered and nonviable surface 100%. We have been using Santyl and silver alginate and this will continue for this week The patient is approved for 5 Apligraf's and I think she is good enough to start using these next week 10/27; the patient's wounds actually look quite good. We have been using Santyl and silver alginate. We applied Apligraf #1 today Saw Dr. Stanford Breed her vascular surgeon on 10/25 he told her TMA was healing nicely. She will  follow-up with them in December to repeat noninvasive testing. 08/16/2021 upon evaluation today patient appears to be doing much better in regard to her wounds. Both are showing signs of improvement which is great news. I am going to perform some debridement to clear away some of the necrotic debris currently this will just be a light debridement. Nonetheless other than that I feel like that the Apligraf is doing quite well working and reapply today and this will be #3 as far as the application is concerned. 12/20; since the patient was last here she was seen by Dr. Stanford Breed who felt that her Doppler flow was sluggish in the bypass. She therefore went underwent a urgent angiogram on 09/07/2021 by Dr. Carlis Abbott. She had an angioplasty of the distal anastomosis and the anterior tibial artery. She tolerated this well. She had no flow-limiting stenosis in the aortoiliac segment on the left.  Some calcification in the left common iliac artery but it was not flow-limiting the common femoral and profunda were patent. The left lower extremity bypass had brisk flow with no significant proximal anastomosis on the common femoral artery. Distally there was a high-grade stenosis where the bypass anastomosis was performed. This was angioplastied as well as the anterior tibial artery. 12/28; the patient comes in early because of a wound over the tip of her right first metatarsal head. She is not really sure how this happened. Says she noticed it when she took her sock off on Friday before she got in the shower. She has been using Hydrofera Blue to this area. This is the original wounded site we dealt with in the setting of a previous right TMA. The patient has PA I researched this on [employer] link. As it turns out she has had recent arterial studies noninvasive on 09/05/2021. On the right she had dampened monophasic waveforms at the popliteal absent waveforms at the PTA and dorsalis pedis. An ABI could not be obtained previously at 0.47. The comment states that they were unable to detect Doppler waveforms of the posterior tibial and dorsalis pedis arteries. 2023 09/26/2021; her original left TMA wound is almost closed. I reapplied Apligraf to the remaining area. The area on the plantar foot is also closed as far as I can tell HOWEVER she comes in with a second wound on the right first metatarsal head remanent both of these probe to bone. I did a bone scraping of this last week which only showed Corynebacterium unlikely to be a true pathogen. Nevertheless I am going to put her on Augmentin today probably for 2 weeks. X-ray will be done today. She has an appointment with Dr. Gwenlyn Saran at vein and vascular next Tuesday I will send him a note about my concerns about the right TM 1/10; we inadvertently remove the Apligraf from the left surgical site however everything looks closed here. She still has 2  probing wounds over the remanent of her first metatarsal head both go to bone although there is less erythema and swelling in the area. She does not have an appointment with Dr. Donzetta Matters until next Tuesday. I am going to go ahead and order an MRI of the right foot I had originally planned to have or at least consider HBO if the patient has underlying osteomyelitis in the right foot however she tells me that she lost a significant amount of her visual field during her last run with HBO in the right eye nasal aspect. She saw Dr. Zadie Rhine who is a retinal specialist  I will see if I can look at his records. She is still on Augmentin as finishes on Monday. 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 She has been on Augmentin with some improvement in the surrounding erythema although the small wound on the tip and the plantar aspect still probe easily to bone and there does not appear to be any viable tissue. UNFORTUNATELY arrives in clinic today with new areas on the right lateral foot neither 1 of these look particularly healthy. No debridement. The patient has an appoint with Dr. Donzetta Matters this morning my question is angiography of the right lego. I also wonder about an infectious disease appointment and I will continue her Augmentin but refer her for consideration. I did not get a culture that was positive even from a bone scraping however. 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 She saw her retina doctor Dr. Deloria Lair and her interpretation of this was that the hyperbarics may have contributed to a blockage. I wonder whether  this was a central retinal vein or artery occlusion. I am not sure that Dr. Zadie Rhine documents in epic I will need to see if I can find his note 1/25; this is Fryberger returns to clinic having a very eventful week. Firstly she underwent angiography by Dr. Stanford Breed of vein and vascular.. Her preintervention ABI was 0. She underwent a right iliofemoral drug-coated balloon angioplasty, right popliteal drug-coated balloon angioplasty and a right tibial peroneal trunk and peroneal angioplasty by Dr. Stanford Breed. She tolerated this well. She definitely has better blood flow to the right foot. She saw Dr. Doren Custard in follow-up who felt her Doppler flow in the right foot was good. An x-ray did not show any evidence of osteomyelitis however I noted a previous MRI certainly did. She also saw infectious diseases been started on IV daptomycin and ertapenem. She had the PICC line placed. I had started her on Augmentin on February 3 and that had some improvement in the erythema and swelling over the remanent of her first metatarsal head. As far as I am aware she did not have a specific organism cultured at least not by me although I did try. Everything on the left is closed including the left transmit amputation site and the area on her left posterior heel/Achilles. We are using silver collagen on the right foot now which has 2 open wounds on the right first metatarsal head and the right fifth metatarsal head Objective Constitutional Patient is hypertensive.. Pulse regular and within target range for patient.Marland Kitchen Respirations regular, non-labored and within target range.. Temperature is normal and within the target range for the patient.Marland Kitchen Appears in no distress. Vitals Time Taken: 9:18 AM, Height: 64 in, Weight: 135 lbs, BMI: 23.2, Temperature: 97.7 F, Pulse: 75 bpm, Respiratory Rate: 16 breaths/min, Blood Pressure: 150/69 mmHg. General Notes: Wound exam; the area on the left transmit and the left Achilles are both healed. oo  The secondary areas which were also ischemic on the right first metatarsal head remanent x2 and on the lateral aspect probably her fifth metatarsal remnant x2 as well. I debrided the larger of the  area over the first metatarsal head as well as into eschared areas laterally. Noteworthy that her peripheral pulses were faintly palpable in the right foot today including the posterior tibia and dorsalis pedis. oo The right first metatarsal head looks a lot better degree of erythema is considerably better Integumentary (Hair, Skin) Wound #14 status is Healed - Epithelialized. Original cause of wound was Pressure Injury. The date acquired was: 05/11/2021. The wound has been in treatment 17 weeks. The wound is located on the Left,Posterior Calcaneus. The wound measures 0cm length x 0cm width x 0cm depth; 0cm^2 area and 0cm^3 volume. There is a medium amount of serosanguineous drainage noted. The wound margin is flat and intact. There is no granulation within the wound bed. There is no necrotic tissue within the wound bed. Wound #15 status is Open. Original cause of wound was Other Lesion. The date acquired was: 09/15/2021. The wound has been in treatment 4 weeks. 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 The wound measures 0.3cm length x 1.3cm width x 0.2cm depth; 0.306cm^2 area and 0.061cm^3 volume. There is Fat Layer (Subcutaneous Tissue) exposed. There is no tunneling or undermining noted. There is a medium amount of serosanguineous drainage noted. The wound margin is distinct with the outline attached to the wound base. There is no granulation within the wound bed. There is a large (67-100%) amount of necrotic tissue within the wound bed including Adherent Slough. Wound #16 status is Open. Original cause of wound was Gradually Appeared. The date acquired was: 09/26/2021. The wound has been in treatment 3 weeks. The wound is located on the [place]. The wound measures  0.1cm length x 0.1cm width x 0.2cm depth; 0.008cm^2 area and 0.002cm^3 volume. There is Fat Layer (Subcutaneous Tissue) exposed. There is no tunneling or undermining noted. There is a medium amount of serosanguineous drainage noted. The wound margin is flat and intact. There is small (1-33%) pink granulation within the wound bed. There is a large (67-100%) amount of necrotic tissue within the wound bed. Wound #17 status is Open. Original cause of wound was Not Known. The date acquired was: 10/10/2021. The wound has been in treatment 1 weeks. The wound is located on the Right,Lateral Foot. The wound measures 0.1cm length x 0.1cm width x 0.1cm depth; 0.008cm^2 area and 0.001cm^3 volume. There is Fat Layer (Subcutaneous Tissue) exposed. There is no tunneling or undermining noted. There is a medium amount of serosanguineous drainage noted. There is medium (34-66%) red granulation within the wound bed. There is a medium (34-66%) amount of necrotic tissue within the wound bed including Adherent Slough. 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 There was a Excisional Skin/Subcutaneous Tissue Debridement with a total area of 0.39 sq cm performed by Ricard Dillon., MD. With the following instrument(s): Curette to remove Non-Viable tissue/material. Material removed includes Subcutaneous Tissue and Slough and after achieving  pain control using Other. No specimens were taken. A time out was conducted at 10:00, prior to the start of  the procedure. A Minimum amount of bleeding was controlled with Pressure. The procedure was tolerated well with a pain level of 2 throughout and a pain level of 1 following the procedure. Post Debridement Measurements: 0.3cm length x 1.3cm width x 0.2cm depth; 0.061cm^3 volume. Character of Wound/Ulcer Post Debridement is improved. Severity of Tissue Post Debridement is: Fat layer exposed. Post procedure Diagnosis Wound #15: Same as Pre-Procedure Wound #17 Pre-procedure diagnosis of Wound #17 is an Abrasion located on the Right,Lateral Foot . There was a Excisional Skin/Subcutaneous Tissue Debridement with a total area of 0.01 sq cm performed by Ricard Dillon., MD. With the following instrument(s): Curette to remove Non-Viable tissue/material. Material removed includes Subcutaneous Tissue and Slough and after achieving pain control using Other. No specimens were taken. A time out was conducted at 10:00, prior to the start of the procedure. A Minimum amount of bleeding was controlled with Pressure. The procedure was tolerated well with a pain level of 2 throughout and a pain level of 1 following the procedure. Post Debridement Measurements: 0.1cm length x 0.1cm width x 0.1cm depth; 0.001cm^3 volume. Character of Wound/Ulcer Post Debridement is improved. Post procedure Diagnosis Wound #17: Same as Pre-Procedure Plan Follow-up Appointments: Return Appointment in 1 week. - with Dr. Arcola Jansky Shower/ Hygiene: May shower with protection but do not get wound dressing(s) wet. Edema Control - Lymphedema / SCD / Other: Elevate legs to the level of the heart or above for 30 minutes daily and/or when sitting, a frequency of: - throughout the day Avoid standing for long periods of time. Home Health: New wound care orders this week; continue Home Health for wound care. May utilize formulary  equivalent dressing for wound treatment orders unless otherwise specified. Dressing changes to be completed by [place] on Monday / Wednesday / Friday except when patient has scheduled visit at [facility]. Other Home Health Orders/Instructions: - XTGGYIR WOUND #15: - Amputation Site - Transmetatarsal Wound Laterality: Right Cleanser: Soap and Water [facility]) Every Other Day/30 Days Discharge Instructions: May shower and wash wound with dial antibacterial soap and water prior to dressing change. Prim Dressing: Promogran Prisma Matrix, 4.34 (sq in) (silver collagen) (Home Health) Every Other Day/30 Days ary Discharge Instructions: Moisten collagen with [place] Jelly Secondary Dressing: Woven Gauze Sponges 2x2 in [facility]) Every Other Day/30 Days Discharge Instructions: Apply over primary dressing as directed. Secured With: [employer], 4.5x3.1 (in/yd) [facility]) Every Other Day/30 Days Discharge Instructions: Secure with Kerlix as directed. Secured With: Trans[employer] Surgical T ape, 2x10 (in/yd) (Home Health) Every Other Day/30 Days Discharge Instructions: Secure dressing with tape as directed. Secured With: Netting Every Other Day/30 Days WOUND #16: - Foot Wound Laterality: Plantar, Right Cleanser: Soap and Water [facility]) Every Other Day/30 Days Discharge Instructions: May shower and wash wound with dial antibacterial soap and water prior to dressing change. Prim Dressing: Promogran Prisma Matrix, 4.34 (sq in) (silver collagen) (Home Health) Every Other Day/30 Days ary Discharge Instructions: Moisten collagen with [place] Jelly Secondary Dressing: Woven Gauze Sponges 2x2 in [facility]) Every Other Day/30 Days Discharge Instructions: Apply over primary dressing as directed. Secured With: [employer], 4.5x3.1 (in/yd) [facility]) Every Other Day/30 Days Discharge Instructions: Secure with Kerlix as directed. Secured With: Transpore Surgical T ape, 2x10 (in/yd)  (Home Health) Every Other Day/30 Days Discharge Instructions: Secure dressing with tape as directed. Secured With: Netting Every Other Day/30 Days 1. We continued with silver collagen on the right foot 2. 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 She saw a retinal specialist Dr. Zadie Rhine but his note did not comment on this the patient is still concerned I will try to reach out to Dr. Zadie Rhine to see if he has any comments. Other than that I think she would be a good candidate for hyperbaric oxygen therapy 3. Now that she has been revascularized I thought it was time to get the wounds ready to hopefully granulate. She is bleeding well from the wounds on her right foot in 2 different locations Electronic Signature(s) Signed: 10/24/2021 4:28:46 PM By: [name] MD Signed: 10/24/2021 5:37:15 PM By: [name] RN, BSN Previous Signature: 10/18/2021 4:28:28 PM Version By: [name] MD Entered By: [name] on 10/23/2021 10:17:02 -------------------------------------------------------------------------------- SuperBill Details Patient Name: Date of Service: [name], [name] 10/18/2021 Medical Record Number: [id number] Patient Account Number: [id number] Date of Birth/Sex: Treating RN: 1940/10/30 (81 y.o. 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 Facility Procedures CPT4 Code: [id number] Description: 63943 - DEB SUBQ TISSUE 20 SQ CM/< ICD-10 Diagnosis Description L97.514 Non-pressure chronic ulcer of other part of right foot with necrosis of bone Modifier: Quantity: 1 Physician Procedures : CPT4 Code Description Modifier [id number] 11042 - WC PHYS SUBQ TISS 20 SQ CM ICD-10 Diagnosis Description L97.514 Non-pressure chronic ulcer of other part of right foot with necrosis of bone Quantity: 1 Electronic Signature(s) Signed: 10/18/2021 4:28:28 PM By: [name] MD Entered By: [name] on 10/18/2021 10:16:25

## 2021-10-20 ENCOUNTER — Telehealth: Payer: Self-pay | Admitting: Endocrinology

## 2021-10-20 DIAGNOSIS — L89620 Pressure ulcer of left heel, unstageable: Secondary | ICD-10-CM | POA: Diagnosis not present

## 2021-10-20 DIAGNOSIS — I70202 Unspecified atherosclerosis of native arteries of extremities, left leg: Secondary | ICD-10-CM | POA: Diagnosis not present

## 2021-10-20 DIAGNOSIS — U071 COVID-19: Secondary | ICD-10-CM | POA: Diagnosis not present

## 2021-10-20 DIAGNOSIS — D638 Anemia in other chronic diseases classified elsewhere: Secondary | ICD-10-CM | POA: Diagnosis not present

## 2021-10-20 DIAGNOSIS — Z7984 Long term (current) use of oral hypoglycemic drugs: Secondary | ICD-10-CM | POA: Diagnosis not present

## 2021-10-20 DIAGNOSIS — E11628 Type 2 diabetes mellitus with other skin complications: Secondary | ICD-10-CM | POA: Diagnosis not present

## 2021-10-20 DIAGNOSIS — I10 Essential (primary) hypertension: Secondary | ICD-10-CM | POA: Diagnosis not present

## 2021-10-20 DIAGNOSIS — Z4781 Encounter for orthopedic aftercare following surgical amputation: Secondary | ICD-10-CM | POA: Diagnosis not present

## 2021-10-20 DIAGNOSIS — E785 Hyperlipidemia, unspecified: Secondary | ICD-10-CM | POA: Diagnosis not present

## 2021-10-20 NOTE — Progress Notes (Signed)
Please call to let patient know that the potassium and kidney results are normal, continue Jardiance

## 2021-10-20 NOTE — Telephone Encounter (Signed)
Pt returned the call to the office. 

## 2021-10-23 DIAGNOSIS — D638 Anemia in other chronic diseases classified elsewhere: Secondary | ICD-10-CM | POA: Diagnosis not present

## 2021-10-23 DIAGNOSIS — Z4781 Encounter for orthopedic aftercare following surgical amputation: Secondary | ICD-10-CM | POA: Diagnosis not present

## 2021-10-23 DIAGNOSIS — E11628 Type 2 diabetes mellitus with other skin complications: Secondary | ICD-10-CM | POA: Diagnosis not present

## 2021-10-23 DIAGNOSIS — I10 Essential (primary) hypertension: Secondary | ICD-10-CM | POA: Diagnosis not present

## 2021-10-23 DIAGNOSIS — I70202 Unspecified atherosclerosis of native arteries of extremities, left leg: Secondary | ICD-10-CM | POA: Diagnosis not present

## 2021-10-23 DIAGNOSIS — L89620 Pressure ulcer of left heel, unstageable: Secondary | ICD-10-CM | POA: Diagnosis not present

## 2021-10-25 ENCOUNTER — Encounter (HOSPITAL_BASED_OUTPATIENT_CLINIC_OR_DEPARTMENT_OTHER): Payer: Medicare Other | Attending: Internal Medicine | Admitting: Internal Medicine

## 2021-10-25 ENCOUNTER — Other Ambulatory Visit: Payer: Self-pay

## 2021-10-25 ENCOUNTER — Encounter (HOSPITAL_COMMUNITY): Payer: Self-pay | Admitting: Vascular Surgery

## 2021-10-25 DIAGNOSIS — L97514 Non-pressure chronic ulcer of other part of right foot with necrosis of bone: Secondary | ICD-10-CM | POA: Diagnosis not present

## 2021-10-25 DIAGNOSIS — E11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: Secondary | ICD-10-CM | POA: Insufficient documentation

## 2021-10-25 DIAGNOSIS — L97528 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ity: Secondary | ICD-10-CM | POA: Insufficient documentation

## 2021-10-25 DIAGNOSIS — Z89431 Acquired absence of right foot: Secondary | ICD-10-CM | POA: Insufficient documentation

## 2021-10-25 DIAGNOSIS — M86671 Other chronic osteomyelitis, right ankle and foot: Secondary | ICD-10-CM | POA: Diagnosis not present

## 2021-10-25 DIAGNOSIS — L97428 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: Secondary | ICD-10-CM | POA: Insufficient documentation

## 2021-10-25 DIAGNOSIS — E11621 Type 2 diabetes mellitus with foot ulcer: Secondary | ICD-10-CM | POA: Insufficient documentation

## 2021-10-25 DIAGNOSIS — L97512 Non-pressure chronic ulcer of other part of right foot with fat layer exposed: Secondary | ICD-10-CM | POA: Diagnosis not present

## 2021-10-25 NOTE — Telephone Encounter (Signed)
Patient was being called for lab results but she has already received and understood

## 2021-10-25 NOTE — Progress Notes (Signed)
PATRECIA, [name] ([id number]) Visit Report for 10/25/2021 Arrival Information Details Patient Name: Date of Service: [name] 10/25/2021 8:30 A M Medical Record Number: [id number] Patient Account Number: [id number] Date of Birth/Sex: Treating RN: Mar 21, 1941 (81 y.o. [name] Primary Care Dariya Gainer: [name] Other Clinician: Referring Tayonna Bacha: Treating Layali Freund/[name]: Tressie Stalker in Treatment: 3 Visit Information History Since Last Visit Added or deleted any medications: No Patient Arrived: [name] Any new allergies or adverse reactions: No Arrival Time: 08:45 Had a fall or experienced change in No Accompanied By: alone activities of daily living that may affect Transfer Assistance: None risk of falls: Patient Identification Verified: Yes Signs or symptoms of abuse/neglect since last visito No Secondary Verification Process Completed: Yes Hospitalized since last visit: No Patient Requires Transmission-Based Precautions: No Implantable device outside of the clinic excluding No Patient Has Alerts: Yes cellular tissue based products placed in the center Patient Alerts: Patient on Blood Thinner since last visit: L ABI: 0.82 Has Dressing in Place as Prescribed: Yes R ABI: 0.47 Pain Present Now: No Electronic Signature(s) Signed: 10/25/2021 5:56:18 PM By: [name] RN, BSN Entered By: [name] on 10/25/2021 08:47:58 -------------------------------------------------------------------------------- Encounter Discharge Information Details Patient Name: Date of Service: [name], [name]. 10/25/2021 8:30 A M Medical Record Number: [id number] Patient Account Number: [id number] Date of Birth/Sex: Treating RN: July 21, 1941 (81 y.o. [name] Primary Care Stoy Fenn: [name] Other Clinician: Referring Alauna Hayden: Treating Duana Benedict/[name]: Tressie Stalker in Treatment: 18 Encounter  Discharge Information Items Post Procedure Vitals Discharge Condition: Stable Temperature (F): 97.5 Ambulatory Status: Cane Pulse (bpm): 99 Discharge Destination: Home Respiratory Rate (breaths/min): 16 Transportation: Private Auto Blood Pressure (mmHg): 189/84 Accompanied By: alone Schedule Follow-up Appointment: Yes Clinical Summary of Care: Patient Declined Electronic Signature(s) Signed: 10/25/2021 5:56:18 PM By: [name] RN, BSN Entered By: [name] on 10/25/2021 15:52:22 -------------------------------------------------------------------------------- Lower Extremity Assessment Details Patient Name: Date of Service: [name], [place]. 10/25/2021 8:30 A M Medical Record Number: [id number] Patient Account Number: [id number] Date of Birth/Sex: Treating RN: 29-Dec-1940 (81 y.o. [name] Primary Care Cassady Turano: [name] Other Clinician: Referring Eulalio Reamy: Treating Keiry Kowal/[name]: Tressie Stalker in Treatment: 18 Edema Assessment Assessed: Shirlyn Goltz: No] Patrice Paradise: No] Edema: [Left: No] [Right: Yes] Calf Left: Right: Point of Measurement: From Medial Instep 31.5 cm 31.3 cm Ankle Left: Right: Point of Measurement: From Medial Instep 20.3 cm 20.3 cm Vascular Assessment Pulses: Dorsalis Pedis Palpable: [Left:Yes] [Right:Yes] Electronic Signature(s) Signed: 10/25/2021 5:56:18 PM By: [name] RN, BSN Entered By: [name] on 10/25/2021 08:48:40 -------------------------------------------------------------------------------- Multi Wound Chart Details Patient Name: Date of Service: [name], [name] 10/25/2021 8:30 A M Medical Record Number: [id number] Patient Account Number: [id number] Date of Birth/Sex: Treating RN: 1941-04-09 (81 y.o. [name], [name] Primary Care Carmin Dibartolo: [name] Other Clinician: Referring [name]: Treating Ayson Cherubini/[name]: Tressie Stalker in  Treatment: 18 Vital Signs Height(in): 64 Capillary Blood Glucose(mg/dl): 117 Weight(lbs): 135 Pulse(bpm): 95 Body Mass Index(BMI): 23.2 Blood Pressure(mmHg): 189/84 Temperature(F): 97.5 Respiratory Rate(breaths/min): 16 Photos: [15:Right Amputation Site -] [16:Right, Plantar Foot] [17:Right, Lateral Foot] Wound Location: [15:Transmetatarsal Other Lesion] [16:Gradually Appeared] [17:Not Known] Wounding Event: [15:Diabetic Wound/Ulcer of the Lower] [16:Diabetic Wound/Ulcer of the Lower] [17:Abrasion] Primary Etiology: [15:Extremity Coronary Artery Disease, Deep Vein] [16:Extremity Coronary Artery Disease, Deep Vein] Comorbid History: [15:Thrombosis, Hypertension, Myocardial Infarction, Peripheral Arterial Disease, Type II Diabetes 09/15/2021] [16:Thrombosis, Hypertension, Myocardial Infarction, Peripheral Arterial Disease, Type II Diabetes 09/26/2021] [  17:Type II  Diabetes 10/10/2021] Date Acquired: [15:5] [16:4] [17:2] Weeks of Treatment: [15:Open] [16:Open] [17:Open] Wound Status: [15:No] [16:No] [17:No] Wound Recurrence: [15:0.4x1x0.2] [16:0x0x0] [17:0.1x0.2x0.1] Measurements L x W x D (cm) [15:0.314] [16:0] [17:0.016] A (cm) : rea [15:0.063] [16:0] [17:0.002] Volume (cm) : [15:20.10%] [16:100.00%] [17:89.80%] % Reduction in A [15:rea: 46.60%] [16:100.00%] [17:87.50%] % Reduction in Volume: [15:Grade 3] [16:Grade 2] [17:Full Thickness Without Exposed] Classification: [15:Medium] [16:None Present] [17:Support Structures Medium] Exudate A mount: [15:Serosanguineous] [16:N/A] [17:Serosanguineous] Exudate Type: [15:red, brown] [16:N/A] [17:red, brown] Exudate Color: [15:Distinct, outline attached] [16:Flat and Intact] [17:Flat and Intact] Wound Margin: [15:Small (1-33%)] [16:None Present (0%)] [17:Large (67-100%)] Granulation A mount: [15:Pale] [16:N/A] [17:Pink, Pale] Granulation Quality: [15:Large (67-100%)] [16:None Present (0%)] [17:None Present (0%)] Necrotic A mount: [15:Fat  Layer (Subcutaneous Tissue): Yes Fascia: No] [17:Fat Layer (Subcutaneous Tissue): Yes] Exposed Structures: [15:Fascia: No Tendon: No Muscle: No Joint: No Bone: No Small (1-33%)] [16:Fat Layer (Subcutaneous Tissue): No Fascia: No Tendon: No Muscle: No Joint: No Bone: No Large (67-100%)] [17:Tendon: No Muscle: No Joint: No Bone: No Medium (34-66%)] Epithelialization: [15:Debridement - Excisional] [16:N/A] [17:Debridement - Excisional] Debridement: Pre-procedure Verification/Time Out 09:27 [16:N/A] [17:09:27] Taken: [15:Subcutaneous, Slough] [16:N/A] [17:Subcutaneous, Slough] Tissue Debrided: [15:Skin/Subcutaneous Tissue] [16:N/A] [17:Skin/Subcutaneous Tissue] Level: [15:0.4] [16:N/A] [17:0.02] Debridement A (sq cm): [15:rea Curette] [16:N/A] [17:Curette] Instrument: [15:Minimum] [16:N/A] [17:Minimum] Bleeding: [15:Pressure] [16:N/A] [17:Pressure] Hemostasis A chieved: [15:0] [16:N/A] [17:0] Procedural Pain: [15:0] [16:N/A] [17:0] Post Procedural Pain: [15:Procedure was tolerated well] [16:N/A] [17:Procedure was tolerated well] Debridement Treatment Response: [15:0.4x1x0.2] [16:N/A] [17:0.1x0.2x0.1] Post Debridement Measurements L x W x D (cm) [15:0.063] [16:N/A] [17:0.002] Post Debridement Volume: (cm) [15:Debridement] [16:N/A] [17:Debridement] Wound Number: 18 N/A N/A Photos: N/A N/A Right, Medial Lower Leg N/A N/A Wound Location: Gradually Appeared N/A N/A Wounding Event: Diabetic Wound/Ulcer of the Lower N/A N/A Primary Etiology: Extremity Coronary Artery Disease, Deep Vein N/A N/A Comorbid History: Thrombosis, Hypertension, Myocardial Infarction, Peripheral Arterial Disease, Type II Diabetes 10/22/2021 N/A N/A Date Acquired: 0 N/A N/A Weeks of Treatment: Open N/A N/A Wound Status: No N/A N/A Wound Recurrence: 0.5x0.5x0.1 N/A N/A Measurements L x W x D (cm) 0.196 N/A N/A A (cm) : rea 0.02 N/A N/A Volume (cm) : 0.00% N/A N/A % Reduction in A rea: 0.00% N/A N/A %  Reduction in Volume: Grade 2 N/A N/A Classification: Medium N/A N/A Exudate A mount: Serous N/A N/A Exudate Type: amber N/A N/A Exudate Color: Flat and Intact N/A N/A Wound Margin: Large (67-100%) N/A N/A Granulation A mount: Pink N/A N/A Granulation Quality: None Present (0%) N/A N/A Necrotic Amount: Fat Layer (Subcutaneous Tissue): Yes N/A N/A Exposed Structures: Fascia: No Tendon: No Muscle: No Joint: No Bone: No None N/A N/A Epithelialization: N/A N/A N/A Debridement: N/A N/A N/A Tissue Debrided: N/A N/A N/A Level: N/A N/A N/A Debridement A (sq cm): rea N/A N/A N/A Instrument: N/A N/A N/A Bleeding: N/A N/A N/A Hemostasis A chieved: N/A N/A N/A Procedural Pain: N/A N/A N/A Post Procedural Pain: Debridement Treatment Response: N/A N/A N/A Post Debridement Measurements L x N/A N/A N/A W x D (cm) N/A N/A N/A Post Debridement Volume: (cm) N/A N/A N/A Procedures Performed: Treatment Notes Electronic Signature(s) Signed: 10/25/2021 4:31:34 PM By: [name] MD Signed: 10/25/2021 4:37:24 PM By: Rhae Hammock RN Entered By: [name] on 10/25/2021 10:20:10 -------------------------------------------------------------------------------- Multi-Disciplinary Care Plan Details Patient Name: Date of Service: [name], [name] 10/25/2021 8:30 A M Medical Record Number: [id number] Patient Account Number: [id number] Date of Birth/Sex: Treating RN: Jul 12, 1941 (81 y.o. F) Donnal Debar,  [place] Primary Care Corrissa Martello: [name] Other Clinician: Referring Joevanni Roddey: Treating Branch Pacitti/[name]: Tressie Stalker in Treatment: [place] reviewed with physician Active Inactive Nutrition Nursing Diagnoses: Impaired glucose control: actual or potential Potential for alteratiion in Nutrition/Potential for imbalanced nutrition Goals: Patient/caregiver agrees to and verbalizes understanding of need to use  nutritional supplements and/or vitamins as prescribed Date Initiated: 06/15/2021 Date Inactivated: 09/20/2021 Target Resolution Date: 09/23/2021 Goal Status: Met Patient/caregiver will maintain therapeutic glucose control Date Initiated: 06/15/2021 Target Resolution Date: 11/17/2021 Goal Status: Active Interventions: Assess HgA1c results as ordered upon admission and as needed Assess patient nutrition upon admission and as needed per policy Provide education on elevated blood sugars and impact on wound healing Provide education on nutrition Treatment Activities: Education provided on Nutrition : 06/15/2021 Notes: 09/20/21: Glucose control ongoing. Wound/Skin Impairment Nursing Diagnoses: Impaired tissue integrity Knowledge deficit related to ulceration/compromised skin integrity Goals: Patient/caregiver will verbalize understanding of skin care regimen Date Initiated: 06/15/2021 Target Resolution Date: 11/17/2021 Goal Status: Active Interventions: Assess patient/caregiver ability to obtain necessary supplies Assess patient/caregiver ability to perform ulcer/skin care regimen upon admission and as needed Assess ulceration(s) every visit Provide education on ulcer and skin care Notes: 09/20/21: Wound care regimen continues Electronic Signature(s) Signed: 10/25/2021 5:56:18 PM By: [name] RN, BSN Entered By: [name] on 10/25/2021 09:09:06 -------------------------------------------------------------------------------- Pain Assessment Details Patient Name: Date of Service: [name], [place]. 10/25/2021 8:30 A M Medical Record Number: [id number] Patient Account Number: [id number] Date of Birth/Sex: Treating RN: 1941/02/17 (80 y.o. [name] Primary Care Felton Buczynski: [name] Other Clinician: Referring Caylen Yardley: Treating Senovia Gauer/[name]: Tressie Stalker in Treatment: 18 Active Problems Location of Pain Severity and  Description of Pain Patient Has Paino No Site Locations Pain Management and Medication Current Pain Management: Electronic Signature(s) Signed: 10/25/2021 5:56:18 PM By: [name] RN, BSN Entered By: [name] on 10/25/2021 08:48:33 -------------------------------------------------------------------------------- Patient/Caregiver Education Details Patient Name: Date of Service: [name], [name] 2/1/2023andnbsp8:30 A M Medical Record Number: [id number] Patient Account Number: [id number] Date of Birth/Gender: Treating RN: January 14, 1941 (81 y.o. [name] Primary Care Physician: [name] Other Clinician: Referring Physician: Treating Physician/[name]: Tressie Stalker in Treatment: 18 Education Assessment Education Provided To: Patient Education Topics Provided Wound/Skin Impairment: Methods: Explain/Verbal Responses: State content correctly [employer]) Signed: 10/25/2021 5:56:18 PM By: [name] RN, BSN Entered By: [name] on 10/25/2021 09:09:21 -------------------------------------------------------------------------------- Wound Assessment Details Patient Name: Date of Service: [name], [place]ber[place] H. 10/25/2021 8:30 A M Medical Record Number: [id number] Patient Account Number: [id number] Date of Birth/Sex: Treating RN: August 08, 1941 (81 y.o. 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 Wound Open Wounding Event: Other Lesion Status: Date Acquired: 09/15/2021 Comorbid Coronary Artery Disease, Deep Vein Thrombosis, Hypertension, Weeks Of Treatment: 5 History: Myocardial Infarction, Peripheral Arterial Disease, Type II  Diabetes Clustered Wound: No Photos Wound Measurements Length: (cm) 0.4 Width: (cm) 1 Depth: (cm) 0.2 Area: (cm) 0.314 Volume: (cm) 0.063 % Reduction in Area: 20.1% % Reduction in Volume: 46.6% Epithelialization: Small (1-33%) Tunneling: No Undermining: No Wound Description Classification: Grade 3 Wound Margin: Distinct, outline attached Exudate Amount: Medium Exudate Type: Serosanguineous Exudate Color: red, brown Foul Odor After Cleansing: No Slough/Fibrino Yes Wound Bed Granulation Amount: Small (1-33%) Exposed Structure Granulation  Quality: Pale Fascia Exposed: No Necrotic Amount: Large (67-100%) Fat Layer (Subcutaneous Tissue) Exposed: Yes Necrotic Quality: Adherent Slough Tendon Exposed: No Muscle Exposed: No Joint Exposed: No Bone Exposed: No Treatment Notes 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 Wound Cleanser Discharge Instruction: Cleanse the wound with wound cleanser prior to applying a clean dressing using gauze sponges, not tissue or cotton balls. Peri-Wound Care Topical Primary Dressing Promogran Prisma Matrix, 4.34 (sq in) (silver collagen) Discharge Instruction: Moisten collagen with [place] Jelly Secondary Dressing Woven Gauze Sponges 2x2 in Discharge Instruction: Apply over primary dressing as directed. Secured With [employer], 4.5x3.1 (in/yd) Discharge Instruction: Secure with Kerlix as directed. Transpore Surgical Tape, 2x10 (in/yd) Discharge Instruction: Secure dressing with tape as directed. Compression Wrap Compression Stockings Add-Ons Electronic Signature(s) Signed: 10/25/2021 5:56:18 PM By: [name] RN, BSN Entered By: [name] on 10/25/2021 09:01:12 -------------------------------------------------------------------------------- Wound Assessment  Details Patient Name: Date of Service: [name], [place] 10/25/2021 8:30 A M Medical Record Number: [id number] Patient Account Number: [id number] Date of Birth/Sex: Treating RN: 03/06/1941 (81 y.o. [name] Primary Care Gennaro Lizotte: [name] Other Clinician: Referring Asencion Loveday: Treating Garvis Downum/[name]: Tressie Stalker in Treatment: 18 Wound Status Wound Number: 16 Primary Diabetic Wound/Ulcer of the Lower Extremity Etiology: Wound Location: Right, Plantar Foot Wound Open Wounding Event: Gradually Appeared Status: Date Acquired: 09/26/2021 Comorbid Coronary Artery Disease, Deep Vein Thrombosis, Hypertension, Weeks Of Treatment: 4 History: Myocardial Infarction, Peripheral Arterial Disease, Type II Diabetes Clustered Wound: No Photos Wound Measurements Length: (cm) Width: (cm) Depth: (cm) Area: (cm) Volume: (cm) 0 % Reduction in Area: 100% 0 % Reduction in Volume: 100% 0 Epithelialization: Large (67-100%) 0 Tunneling: No 0 Undermining: No Wound Description Classification: Grade 2 Wound Margin: Flat and Intact Exudate Amount: None Present Foul Odor After Cleansing: No Slough/Fibrino No Wound Bed Granulation Amount: None Present (0%) Exposed Structure Necrotic Amount: None Present (0%) Fascia Exposed: No Fat Layer (Subcutaneous Tissue) Exposed: No Tendon Exposed: No Muscle Exposed: No Joint Exposed: No Bone Exposed: No Electronic Signature(s) Signed: 10/25/2021 5:56:18 PM By: [name] RN, BSN Entered By: [name] on 10/25/2021 09:01:50 -------------------------------------------------------------------------------- Wound Assessment Details Patient Name: Date of Service: [name], Elberta [place] H. 10/25/2021 8:30 A M Medical Record Number: [id number] Patient Account Number: [id number] Date of Birth/Sex: Treating RN: [birth year] (81 y.o. [name] Primary Care Ariatna Jester: [name] Other  Clinician: Referring Benigna Delisi: Treating Henrique Parekh/[name]: Tressie Stalker in Treatment: 18 Wound Status Wound Number: 17 Primary Abrasion Etiology: Wound Location: Right, Lateral Foot Wound Open Wounding Event: Not Known Status: Date Acquired: 10/10/2021 Comorbid Coronary Artery Disease, Deep Vein Thrombosis, Hypertension, Weeks Of Treatment: 2 History: Myocardial Infarction, Peripheral Arterial Disease, Type II Diabetes Clustered Wound: No Photos Wound Measurements Length: (cm) 0.1 Width: (cm) 0.2 Depth: (cm) 0.1 Area: (cm) 0.016 Volume: (cm) 0.002 % Reduction in Area: 89.8% % Reduction in Volume: 87.5% Epithelialization: Medium (34-66%) Tunneling: No Undermining: No Wound Description Classification: Full Thickness Without Exposed Support Structures Wound Margin: Flat and Intact Exudate Amount: Medium Exudate Type: Serosanguineous Exudate Color: red, brown Foul Odor After Cleansing: No Slough/Fibrino No Wound Bed Granulation Amount: Large (67-100%) Exposed Structure Granulation Quality: Pink, Pale Fascia Exposed: No Necrotic Amount: None Present (0%) Fat Layer (Subcutaneous Tissue) Exposed: Yes Tendon Exposed: No Muscle Exposed: No Joint Exposed: No Bone Exposed: No Treatment Notes  Wound #17 (Foot) Wound Laterality: Right, Lateral Cleanser Soap and Water Discharge Instruction: May shower and wash wound with dial antibacterial soap and water prior to dressing change. Wound Cleanser Discharge Instruction: Cleanse the wound with wound cleanser prior to applying a clean dressing using gauze sponges, not tissue or cotton balls. Peri-Wound Care Topical Primary Dressing Promogran Prisma Matrix, 4.34 (sq in) (silver collagen) Discharge Instruction: Moisten collagen with [place] Jelly Secondary Dressing Woven Gauze Sponges 2x2 in Discharge Instruction: Apply over primary dressing as directed. Secured With [employer], 4.5x3.1  (in/yd) Discharge Instruction: Secure with Kerlix as directed. Transpore Surgical Tape, 2x10 (in/yd) Discharge Instruction: Secure dressing with tape as directed. Compression Wrap Compression Stockings Add-Ons Electronic Signature(s) Signed: 10/25/2021 5:56:18 PM By: [name] RN, BSN Entered By: [name] on 10/25/2021 09:02:20 -------------------------------------------------------------------------------- Wound Assessment Details Patient Name: Date of Service: [name], [place]. 10/25/2021 8:30 A M Medical Record Number: [id number] Patient Account Number: [id number] Date of Birth/Sex: Treating RN: [birth year] (81 y.o. [name] Primary Care Shaquana Buel: [name] Other Clinician: Referring Suzie Vandam: Treating Ediberto Sens/[name]: Tressie Stalker in Treatment: 18 Wound Status Wound Number: 18 Primary Diabetic Wound/Ulcer of the Lower Extremity Etiology: Wound Location: Right, Medial Lower Leg Wound Open Wounding Event: Gradually Appeared Status: Date Acquired: 10/22/2021 Comorbid Coronary Artery Disease, Deep Vein Thrombosis, Hypertension, Weeks Of Treatment: 0 History: Myocardial Infarction, Peripheral Arterial Disease, Type II Diabetes Clustered Wound: No Photos Wound Measurements Length: (cm) 0.5 Width: (cm) 0.5 Depth: (cm) 0.1 Area: (cm) 0.196 Volume: (cm) 0.02 % Reduction in Area: 0% % Reduction in Volume: 0% Epithelialization: None Tunneling: No Undermining: No Wound Description Classification: Grade 2 Wound Margin: Flat and Intact Exudate Amount: Medium Exudate Type: Serous Exudate Color: amber Foul Odor After Cleansing: No Slough/Fibrino No Wound Bed Granulation Amount: Large (67-100%) Exposed Structure Granulation Quality: Pink Fascia Exposed: No Necrotic Amount: None Present (0%) Fat Layer (Subcutaneous Tissue) Exposed: Yes Tendon Exposed: No Muscle Exposed: No Joint Exposed: No Bone Exposed:  No Treatment Notes Wound #18 (Lower Leg) Wound Laterality: Right, Medial Cleanser Soap and Water Discharge Instruction: May shower and wash wound with dial antibacterial soap and water prior to dressing change. Wound Cleanser Discharge Instruction: Cleanse the wound with wound cleanser prior to applying a clean dressing using gauze sponges, not tissue or cotton balls. Peri-Wound Care Topical Primary Dressing KerraCel Ag Gelling Fiber Dressing, 2x2 in (silver alginate) Discharge Instruction: Apply silver alginate to wound bed as instructed Secondary Dressing Bordered Gauze, 2x2 in Discharge Instruction: Apply over primary dressing as directed. Secured With Compression Wrap Compression Stockings Environmental education officer) Signed: 10/25/2021 5:56:18 PM By: [name] RN, BSN Entered By: [name] on 10/25/2021 09:02:47 -------------------------------------------------------------------------------- Vitals Details Patient Name: Date of Service: [name], [place]. 10/25/2021 8:30 A M Medical Record Number: [id number] Patient Account Number: [id number] Date of Birth/Sex: Treating RN: [birth year] (81 y.o. [name] Primary Care Uriyah Raska: [name] Other Clinician: Referring Ronasia Isola: Treating Lyne Khurana/[name]: Tressie Stalker in Treatment: 18 Vital Signs Time Taken: 08:45 Temperature (F): 97.5 Height (in): 64 Pulse (bpm): 99 Weight (lbs): 135 Respiratory Rate (breaths/min): 16 Body Mass Index (BMI): 23.2 Blood Pressure (mmHg): 189/84 Capillary Blood Glucose (mg/dl): 117 Reference Range: 80 - 120 mg / dl Notes glucose per pt report this AM Electronic Signature(s) Signed: 10/25/2021 5:56:18 PM By: [name] RN, BSN Entered By: [name] on 10/25/2021 08:48:27

## 2021-10-25 NOTE — Progress Notes (Signed)
LACHANDA, BUCZEK ([id number]) Visit Report for 10/25/2021 Debridement Details Patient Name: Date of Service: [name] 10/25/2021 8:30 A M Medical Record Number: [id number] Patient Account Number: [id number] Date of Birth/Sex: Treating RN: May 06, 1941 (81 y.o. [name], [name] Primary Care Provider: [name] Other Clinician: Referring Provider: Treating Provider/[name]: Tressie Stalker in Treatment: 18 Debridement Performed for Assessment: 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 Performed By: Physician Ricard Dillon., MD Debridement Type: Debridement Severity of Tissue Pre Debridement: Fat layer exposed Level of Consciousness (Pre-procedure): Awake and Alert Pre-procedure Verification/Time Out Yes - 09:27 Taken: Start Time: 09:27 T Area Debrided (L x W): otal 0.4 (cm) x 1 (cm) = 0.4 (cm) Tissue and other material debrided: Viable, Non-Viable, Slough, Subcutaneous, Slough Level: Skin/Subcutaneous Tissue Debridement Description: Excisional Instrument: Curette Bleeding: Minimum Hemostasis Achieved: Pressure End Time: 09:28 Procedural Pain: 0 Post Procedural Pain: 0 Response to Treatment: Procedure was tolerated well Level of Consciousness (Post- Awake and Alert procedure): Post Debridement Measurements of Total Wound Length: (cm) 0.4 Width: (cm) 1 Depth: (cm) 0.2 Volume: (cm) 0.063 Character of Wound/Ulcer Post Debridement: Improved Severity of Tissue Post Debridement: Fat layer exposed Post Procedure Diagnosis Same as Pre-procedure Electronic Signature(s) Signed: 10/25/2021 4:31:34 PM By: [name] MD Signed: 10/25/2021 4:37:24 PM By: Rhae Hammock RN Entered By: [name] on 10/25/2021 10:20:27 -------------------------------------------------------------------------------- Debridement Details Patient Name: Date of Service: [name], [name] LLY H. 10/25/2021 8:30 A M Medical Record Number:  [id number] Patient Account Number: [id number] Date of Birth/Sex: Treating RN: 30-Sep-1940 (81 y.o. [name], [name] Primary Care Provider: [name] Other Clinician: Referring Provider: Treating Provider/[name]: Tressie Stalker in Treatment: 18 Debridement Performed for Assessment: Wound #17 Right,Lateral Foot Performed By: Physician Ricard Dillon., MD Debridement Type: Debridement Level of Consciousness (Pre-procedure): Awake and Alert Pre-procedure Verification/Time Out Yes - 09:27 Taken: Start Time: 09:27 T Area Debrided (L x W): otal 0.1 (cm) x 0.2 (cm) = 0.02 (cm) Tissue and other material debrided: Viable, Non-Viable, Slough, Subcutaneous, Slough Level: Skin/Subcutaneous Tissue Debridement Description: Excisional Instrument: Curette Bleeding: Minimum Hemostasis Achieved: Pressure End Time: 09:28 Procedural Pain: 0 Post Procedural Pain: 0 Response to Treatment: Procedure was tolerated well Level of Consciousness (Post- Awake and Alert procedure): Post Debridement Measurements of Total Wound Length: (cm) 0.1 Width: (cm) 0.2 Depth: (cm) 0.1 Volume: (cm) 0.002 Character of Wound/Ulcer Post Debridement: Improved Post Procedure Diagnosis Same as Pre-procedure Electronic Signature(s) Signed: 10/25/2021 4:31:34 PM By: [name] MD Signed: 10/25/2021 4:37:24 PM By: Rhae Hammock RN Entered By: [name] on 10/25/2021 10:20:39 -------------------------------------------------------------------------------- HPI Details Patient Name: Date of Service: [name], [name] LLY H. 10/25/2021 8:30 A M Medical Record Number: [id number] Patient Account Number: [id number] Date of Birth/Sex: Treating RN: [birth year] (81 y.o. [name], [name] Primary Care Provider: [name] Other Clinician: Referring Provider: Treating Provider/[name]: Tressie Stalker in Treatment: 18 History of Present  Illness HPI Description: ADMISSION 07/13/2019 Patient is a 81 year old type II diabetic. She has known PAD. She has been followed by Dr. Jacqualyn Posey of podiatry for blistering areas on her toes dating back to 04/30/2019 which at this was the left fourth toe. By 8/11 she had wounds on the right and left second toes. She underwent arterial studies by Dr. Alvester Chou on 7/31 that showed ABIs on the right of 0.60 TBI of 0.26 on the left ABI of 0.56 and a TBI of 0.25. By 9/10 she had ischemic-looking wounds per Dr. Earleen Newport on the right  first, left first second and third. She has been using Medihoney and then mupirocin more recently simply Betadine. The patient underwent angiography by Dr. Gwenlyn Found on 9/21. 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 Noted that there was 1 vessel runoff in the foot via the peroneal. On the left there was an 80% left SFA, 70% mid left SFA. There was a short segment calcified CTO distal less than SFA and above-knee popliteal artery reconstituting with two-vessel runoff. 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 It was felt that she had bilateral SFA disease as well as tibial vessel disease. An attempt was made to revascularize the left SFA but they were unable to cross because of the highly calcified nature of the lesion. 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 She has an area on the medial left fourth toe with raised horned callus on top of this. I am not certain what this represents. With regards to pain she has about a 2-1/2 I will claudication tolerance in the grocery store. She has some pain in night which is relieved by putting her feet down over the side of the bed. She is wearing Nitro-Dur patches on the top of her right foot. Past medical  history includes type 2 diabetes with secondary PAD, neoplasm of the skin, diabetic retinopathy, carotid artery stenosis, hyperlipidemia hypertension. 11/2; the last time the patient was here I spoke to Dr. Gwenlyn Found about revascularization options on the right. As I understand things currently Dr. Gwenlyn Found spoke with Dr. Trula Slade and ultimately the patient was taken to surgery on 10/27. She had a right iliofemoral endarterectomy with a bovine patch angioplasty. I think the plan now is for her to have a staged intervention on the right SFA by Dr. Alvester Chou. Per the patient's understanding Dr. Gwenlyn Found and Dr. Jacqualyn Posey are waiting to see when the gangrenous toes on the right foot can be amputated. The patient states her pain is a lot better and she is grateful for that. 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 Small area on the left fourth toe. She is using Betadine to the mummified areas on the right and Medihoney on the left 08/27/2019; the last time I saw this patient I discharged her from the clinic. She had been revascularized by Dr. Trula Slade and she had a right iliofemoral endarterectomy with a bovine angioplasty. 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 Jacqualyn Posey of podiatry on 08/07/2019. I note that she also had a intervention by Dr. Gwenlyn Found and he performed a directional atherectomy and drug-coated balloon angioplasty of the SFA and popliteal artery on the right. I am not certain of the date of Dr. Kennon Holter procedure as of the time of this dictation. She was referred back to [place] by Dr. Earleen Newport predominantly for follow-up of the left fourth toe. She still has sutures and stitches in the right TMA site. She states her pain is a lot better. She expresses concern about the condition of the amputation site at the TMA. She is on doxycycline I think prescribed by Dr. Earleen Newport. She is complaining of some pain at night 12/10; I spoke to Dr.  Jacqualyn Posey last week. He removed the sutures on the  right foot on Monday of this week. She has the area on the left fourth toe just proximal to the PIP and then the right TMA site. She is still on doxycycline and has enough through next week. Unfortunately the TMA site does not look good at all. Both on the lateral and medial part of the incisions are areas that probe to bone. There is purulence over the medial part which I have cultured. We will use silver alginate. Left fourth toe looks somewhat better but there was still exposed bone 12/17; patient has an MRI booked for 12/30. Culture I did last week showed Pseudomonas Serratia and Enterococcus. This was purulent drainage coming out of the medial part of her amputation site. I use cefdinir 300 twice daily for 10 days that started on 12/15. Her x-ray on the right showed limited evaluation for osteomyelitis. The findings could have been postoperative. There was subtle erosion in the distal first and distal fifth metatarsal. An MRI was suggested. On the left she had irregularity of the fourth middle and proximal phalanx consistent with a history of osteomyelitis. I do not know that she has a history of osteomyelitis in this area. She had a newly defined area on the plantar third toe 12/31; patient's MRI is listed below: MRI OF THE RIGHT FOREFOOT WITHOUT AND WITH CONTRAST TECHNIQUE: Multiplanar, multisequence MR imaging of the right forefoot was performed before and after the administration of intravenous contrast. CONTRAST 6 mL GADAVIST IV SOLN : COMPARISON: Plain films right foot 09/04/2019. FINDINGS: Bones/Joint/Cartilage The patient is status post transmetatarsal amputation as seen on the prior exam. Marrow edema and enhancement are seen in all of the distal metatarsals. In the first metatarsal, signal change extends 1.5 cm proximal to the stump and in the second metatarsal extends approximately 2 cm proximal to the stump. Edema and  enhancement are seen in only the distal 0.7 cm of the third metatarsal stump and tips of the fourth and fifth metatarsals. Bone marrow signal is otherwise unremarkable. A small focus of subchondral edema is seen in the lateral talus, likely degenerative. Ligaments Intact. Muscles and Tendons No intramuscular fluid collection. Soft tissues Skin ulceration is seen off the stump of the first metatarsal. A thin fluid collection tracks deep to the wound and over the anterior metatarsals worrisome for small abscess. Intense subcutaneous edema and enhancement are seen diffusely. 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 Cellulitis about the foot. Skin ulceration over the distal first metatarsal is identified with a thin fluid collection tracking anteriorly along the stump worrisome for abscess. Small focus of subchondral edema in the lateral talus is likely degenerative. Electronically Signed By: Inge Rise M.D. On: 09/23/2019 15:25 Patient arrives in clinic today not complaining any of any pain. 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 She does not describe claudication however her activity is very limited. 10/01/2019; since the patient was last here I have communicated with Dr. Gwenlyn Found after bypass by Dr. Trula Slade and addressing the right superficial femoral artery he states that she is widely patent through peroneal arteries to the ankle with collaterals to the dorsalis pedis. He states he is going to talk to colleagues about the feasibility of tibial pedal access. The patient seems infectious disease later this afternoon Dr. Baxter Flattery. in preparation for this she has been off antibiotics for 1  week and I went ahead and obtain pieces of the remanence of her first  metatarsal for pathology and CandS. The patient is a candidate for hyperbaric oxygen. 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 1/15; considerable improvement in the wounds on her feet. We are using silver collagen. She follows with Dr. Baxter Flattery of infectious disease and is on meropenem and daptomycin. She has been taught how to do this herself at home. Follows with Dr. Graylon Good at the end of treatment here. She has 2 wounds on the surgical TMA site 1 lateral and 1 medial the lateral 1 has regressed tremendously. The area medially still has some exposed bone although the base of this looks healthy. 1/22; 2 separate wounds on the surgical TMA site. Both of these look satisfactory. The medial area does not have any exposed bone. This is an improvement On the left side fourth toe dorsally over the proximal phalanx there is a deep punched out area that probes to bone. She has an area on the tip of the left third toe. She also tells [place] that in HBO she traumatized her left Achilles and this is left her with a superficial wound area 1/28; weekly visit along with HBO. She has 5 wounds. T our punched out areas on the original TMA site on the right. Both of these appear to have contracted. o The area on the right no longer has exposed bone. She has an area on the tip of the left third toe and on the DIP of the left fourth toe. Both of these had surface callus that I removed and unfortunately they have small areas that both go to bone. She has a traumatic wound on the left Achilles area that happened in HBO and that appears better. She is tolerating her IV antibiotics well at home. She has home health changing the dressings and she is doing it once on the weekends. We have been using silver collagen. She has been extensively revascularized on the right by Dr. Trula Slade and subsequently by Dr. Gwenlyn Found. According to her she has severe PAD on the left but there was nothing  that could be done to revascularize this I will need to review these notes 2/5; the patient will see Dr. Baxter Flattery of infectious disease on 2/17. Dr. Gwenlyn Found on 2/23 and according to her her antibiotics stopped on 2/23. She is tolerating hyperbarics well. She has made a tremendous improvement in the right forefoot with only 2 smaller open wounds. Using silver collagen. On the left foot she has the area on the tip of the left third toe and the medial part of the left fourth toe. These had exposed bone last week I did not sense any of that today 2/12; sees Dr. Graylon Good on 2/17 and Dr. Gwenlyn Found on 2/23. We are going to use Dermagraft on this today however the lateral part of her train TMA incision on the right is healed and the medial part is down so much that we just continue with the silver collagen She has wounds on the tip of her left third and the medial aspect of the left fourth. Both of these still have exposed bone I have not been able to get these to epithelialize. 2/19; she sees Dr. Gwenlyn Found on 2/23 and I have communicated with him about the left vascular supply. Looking at her angiogram from 08/03/2019 it looks as though that they could not cross the left CFA. Noted that she had one-vessel runoff  bilaterally. She also apparently saw Dr. Graylon Good although I did not look up these results for preparation of this record. 2/26; Dr. Gwenlyn Found is going to do an angiogram next week on Wednesday. I think they are going to try to go at this both retrograde and anterograde to see if anything can be done to revascularize the left lower limb. She continues to make nice progress on the remaining wound on the right medial foot on her TMA site and the toe it wounds on the left are responding nicely as well 3/12; the patient underwent an extensive and complicated revascularization on the left leg by Dr. Fletcher Anon on 3/3; she had an atherectomy on occluded left popliteal artery; anterior tibial artery followed by a balloon  angioplasty. Atherectomy was also performed to the left SFA because there was still significant 50% stenosis in the left popliteal artery they performed an intravascular lithotripsy which improved the residual stenosis to 20%. The same lithotripsy was used to dilate the proximal left SFA. She had a drug-eluting stent placed I believe in the SFA. The patient returned for hyperbarics this week. She has had some eye problems on the right which she tells me are secondary to diabetic retinopathy and she saw her eye doctor. She is really made excellent progress there is no open wounds on the left foot at all. She has one open area medially and her TMA site on the right lateral wound is closed. 3/19; the patient comes in today with the area on the medial transmet on the right improved. Some debris removed from the surface revealed the still open wound. Unfortunately she now has the open area on the third toe tip and the medial aspect of the dorsal fourth toe. These are in the same location as her previous wounds. These had actually closed up last week. 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 She is undergone complex revascularizations on the right by Dr. Trula Slade and Dr. Gwenlyn Found and I believe Dr. 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 She completed IVantibiotics as directed by infectious disease and I believe a course of oral antibiotics as well.she underwent a complete 40 treatments of hyperbarics. She comes in today with the vast majority of the right TMA site healed there is a small opening on the most medial aspect. Her left third and left fourth toes appear to have epithelialization although this has fluctuated  somewhat. 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 She had a gaping wound on the right transmetatarsal amputation L as well as deep wounds on the left third and left fourth toes. She underwent complex revascularizations on her bilateral lower legs which I described on her last visit. She completed IV antibiotics by infectious disease and 40 treatments of hyperbarics. Her left third and fourth toes are healed. The area on the right transmetatarsal amputation site is also closed today. READMISSION 08/04/2020 [name] is now a 81 year old woman that we had for a complex stay in our clinic from October 2000 14 January 2020. 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 Since she was last in clinic she had an MRI in June of this year and underwent a CABG  on 03/24/20. Her current problems with wounds began on October 30 when she developed a spontaneous opening over the right transmet medial aspect over the first metatarsal head. She has been using collagen on this that she had leftover from her last stay in this clinic. She also has had an open area on the medial right calf from a vein harvest site from her CABG she. She said that this is never really closed since the vein harvest was done. With regards to her arterial status this is a major issue. She had an angiogram by Dr. Gwenlyn Found in September 2020. She developed a gangrenous right first and second toes. Ultimately she was referred to Dr. Trula Slade who did a right common femoral endarterectomy with patch angioplasty. This was on 07/21/2019 and she really had a good result. Dr. Gwenlyn Found did a atherectomy followed by a drug coated balloon angioplasty of her right SFA popliteal and tibial peroneal trunk on 08/03/2019 again with excellent excellent results. Follow-up.Dopplers in November revealed revealed a widely patent SFA and tibioperoneal trunk. UNFORTUNATELY her recent angiogram that was done on 08/01/2020  showed an 80% right common femoral artery stenosis just above the previous endarterectomy site occluded right right SFA at the origin reconstituting the adductor canal by the profunda femoris collaterals. The profunda femoris is also diffusely diseased. There is and again another 80% tibioperoneal trunk stenosis and one-vessel runoff via the peroneal artery. She is seen Dr. Donzetta Matters and she is scheduled for a femoral endarterectomy and right femoral-popliteal bypass grafting with endarterectomy of her tibial peroneal trunk. The scheduled surgery is on 11/23 The patient does not complain of pain at rest. She does have 5-minute walking claudication. The wounds on her right first met head remanent was spontaneous she does not think she hit this. As mentioned in the area on her right medial calf is a vein harvest site Dr. Gwenlyn Found had texted me about getting her in the clinic and we have arranged this as soon as we can. I have told her that we will have to see how successful Dr. Donzetta Matters is before we know what can be done about the wounds in a precise fashion. Fortunately there does not seem to be any obvious infection involving the right foot although that may need to be looked at if things really deteriorate. She was treated with IV antibiotics and hyperbaric oxygen for her previous osteomyelitis 12/2; the patient underwent a right femoral endarterectomy and bypass femoral to popliteal artery. This was done on 08/16/2020. She is done remarkably well. She has a small area on the right anterior tibia and a small area in the medial part of her original right transmetatarsal amputation. She appears to have had an excellent response to surgery. 12/16; the patient's area on the right tibia and the small area on the medial part of her original right transmetatarsal amputation is totally healed READMISSION 06/15/2021 [name] is a now 81 year old woman who arrives for review of wounds on the left TMA site extending  into her plantar foot as well as an area on the left posterior calcaneus. 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 She was extensively revascularized on the right at that time as noted above in our previous notes ended up receiving IV antibiotics and hyperbaric oxygen therapy. Miraculously this TMA site actually closed and she really has had not any trouble with this since. UNFORTUNATELY the same cannot be  said of her left side. Her problem apparently started sometime in July when she hit her left third toe on a barstool while playing pool. She developed an acute infection which eventually led to underlying osteomyelitis. She underwent an extensive hospitalization from 04/27/2021 through 05/15/2021. She received IV antibiotics. Also extensive intervention by Dr. Stanford Breed of vein and vascular which included a left common femoral to tibioperoneal trunk bypass as well as a left third toe amputation on 05/03/2021. Unfortunately the area did not heal she ended up requiring a left TMA. By her description this was left open. She has been using a wound VAC on this to earlier this week and then was switched to Santyl. I think because of the left forefoot off loader she is also developed an area on the left posterior calcaneus. She is not complaining of a lot of pain. She has her daughter at home to change the dressings. She has been prescribed Santyl and she has a tube that she is brought into our clinic. I cannot get a lot of history of claudication although she is really not that mobile at this point walking with a walker. 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 She has had multiple interventions on the right side by vein and vascular and also by Dr. Gwenlyn Found as well as a  transmetatarsal amputation on the right. She has had follow-up noninvasive studies on 06/06/2021; on the left that showed an ABI of 0.47 with monophasic waveforms a dorsalis pedis ABI of 0.40 with monophasic waveforms. Her ANGIOGRAM which was initially done by Dr. Oneida Alar on 04/28/2021 showed severe left foot superficial femoral artery occlusive disease and an occlusion of the left popliteal artery with two-vessel runoff to the left foot with moderate to severe disease below the knee popliteal artery and tibial disease. She had 80% distal right external iliac artery above the existing femoral above-knee popliteal bypass but she does not have an open wound on the right foot She arrives in clinic with an extensive open area on the TMA site which in the mid aspect extends into the plantar foot. She also has a more superficial area on the upper part of her Achilles area of the left heel 9/29; patient arrives in clinic today with slough over the TMA site. We use MolecuLight to look at the surface of this suggesting cyan and white discoloration over a large part of this wound also extending into the third metatarsal area. She also has the area on the left posterior calcaneus. This is slough covered we will switch to [facility] here as well. There is some warmth in the area and some erythema we will give her antibiotics as well 10/6; completely necrotic surface once again on the left TMA site. We have been using Santyl and Hydrofera Blue. She has an area on the left posterior Achilles and actually states this has been more painful this week indeed there is some erythema around this area. She also describes I think claudication at rest which is relieved by dipping her left leg over the side of the bed at night. 10/13; our intake nurse was able to brush some slough off the surface of the left TMA hence this did not require debridement that is better than last week. The area heading towards the plantar part of her  foot required an aggressive debridement to clean this up. This is  quite a sizable divot but does not go to bone. The area on the right Achilles heel is also covered in a high 100% nonviable tissue we have been using Santyl and silver alginate 10/20; the area on the left TMA looks much better. Even the area is spreading into the midfoot looks improved. The right Achilles still is covered and nonviable surface 100%. We have been using Santyl and silver alginate and this will continue for this week The patient is approved for 5 Apligraf's and I think she is good enough to start using these next week 10/27; the patient's wounds actually look quite good. We have been using Santyl and silver alginate. We applied Apligraf #1 today Saw Dr. Stanford Breed her vascular surgeon on 10/25 he told her TMA was healing nicely. She will follow-up with them in December to repeat noninvasive testing. 08/16/2021 upon evaluation today patient appears to be doing much better in regard to her wounds. Both are showing signs of improvement which is great news. I am going to perform some debridement to clear away some of the necrotic debris currently this will just be a light debridement. Nonetheless other than that I feel like that the Apligraf is doing quite well working and reapply today and this will be #3 as far as the application is concerned. 12/20; since the patient was last here she was seen by Dr. Stanford Breed who felt that her Doppler flow was sluggish in the bypass. She therefore went underwent a urgent angiogram on 09/07/2021 by Dr. Carlis Abbott. She had an angioplasty of the distal anastomosis and the anterior tibial artery. She tolerated this well. She had no flow-limiting stenosis in the aortoiliac segment on the left. Some calcification in the left common iliac artery but it was not flow-limiting the common femoral and profunda were patent. The left lower extremity bypass had brisk flow with no significant proximal anastomosis on  the common femoral artery. Distally there was a high-grade stenosis where the bypass anastomosis was performed. This was angioplastied as well as the anterior tibial artery. 12/28; the patient comes in early because of a wound over the tip of her right first metatarsal head. She is not really sure how this happened. Says she noticed it when she took her sock off on Friday before she got in the shower. She has been using Hydrofera Blue to this area. This is the original wounded site we dealt with in the setting of a previous right TMA. The patient has PA I researched this on [facility]. As it turns out she has had recent arterial studies noninvasive on 09/05/2021. On the right she had dampened monophasic waveforms at the popliteal absent waveforms at the PTA and dorsalis pedis. An ABI could not be obtained previously at 0.47. The comment states that they were unable to detect Doppler waveforms of the posterior tibial and dorsalis pedis arteries. 2023 09/26/2021; her original left TMA wound is almost closed. I reapplied Apligraf to the remaining area. The area on the plantar foot is also closed as far as I can tell HOWEVER she comes in with a second wound on the right first metatarsal head remanent both of these probe to bone. I did a bone scraping of this last week which only showed Corynebacterium unlikely to be a true pathogen. Nevertheless I am going to put her on Augmentin today probably for 2 weeks. X-ray will be done today. She has an appointment with Dr. Gwenlyn Saran at vein and vascular next Tuesday I will send  him a note about my concerns about the right TM 1/10; we inadvertently remove the Apligraf from the left surgical site however everything looks closed here. She still has 2 probing wounds over the remanent of her first metatarsal head both go to bone although there is less erythema and swelling in the area. She does not have an appointment with Dr. Donzetta Matters until next Tuesday. I am going to go  ahead and order an MRI of the right foot I had originally planned to have or at least consider HBO if the patient has underlying osteomyelitis in the right foot however she tells me that she lost a significant amount of her visual field during her last run with HBO in the right eye nasal aspect. She saw Dr. Zadie Rhine who is a retinal specialist I will see if I can look at his records. She is still on Augmentin as finishes on Monday. 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 She has been on Augmentin with some improvement in the surrounding erythema although the small wound on the tip and the plantar aspect still probe easily to bone and there does not appear to be any viable tissue. UNFORTUNATELY arrives in clinic today with new areas on the right lateral foot neither 1 of these look particularly healthy. No debridement. The patient has an appoint with Dr. Donzetta Matters this morning my question is angiography of the right lego. I also wonder about an infectious disease appointment and I will continue her Augmentin but refer her for consideration. I did not get a culture that was positive even from a bone scraping however. 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 She saw her retina doctor Dr. Deloria Lair and her interpretation of this was that the hyperbarics may have contributed to a blockage. I wonder whether this was a central retinal vein or artery occlusion. I am not sure that Dr. Zadie Rhine documents in epic I will need to see if I can find his note 1/25; this is [place] returns to clinic having a very eventful week. Firstly  she underwent angiography by Dr. Stanford Breed of vein and vascular.. Her preintervention ABI was 0. She underwent a right iliofemoral drug-coated balloon angioplasty, right popliteal drug-coated balloon angioplasty and a right tibial peroneal trunk and peroneal angioplasty by Dr. Stanford Breed. She tolerated this well. She definitely has better blood flow to the right foot. She saw Dr. Doren Custard in follow-up who felt her Doppler flow in the right foot was good. An x-ray did not show any evidence of osteomyelitis however I noted a previous MRI certainly did. She also saw infectious diseases been started on IV daptomycin and ertapenem. She had the PICC line placed. I had started her on Augmentin on February 3 and that had some improvement in the erythema and swelling over the remanent of her first metatarsal head. As far as I am aware she did not have a specific organism cultured at least not by me although I did try. Everything on the left is closed including the left transmit amputation site and the area on her left posterior heel/Achilles. We are using silver collagen  on the right foot now which has 2 open wounds on the right first metatarsal head and the right fifth metatarsal head 2/1; the patient's left foot transmit site and the area on her heel remain closed. Some eschar but I do not think this requires debridement She has areas on the right first and right fifth met heads underlying osteomyelitis she is on IV daptomycin and ertapenem. The areas are small but probe to bone. I have debrided with a #3 curette hemostasis with direct pressure I am able to get to some decent looking surrounding granulation tissue on the more worrisome areas on the right first metatarsal head Electronic Signature(s) Signed: 10/25/2021 4:31:34 PM By: [name] MD Entered By: [name] on 10/25/2021 10:21:52 -------------------------------------------------------------------------------- Physical Exam Details Patient Name:  Date of Service: [name], [place]. 10/25/2021 8:30 A M Medical Record Number: [id number] Patient Account Number: [id number] Date of Birth/Sex: Treating RN: [birth year] (81 y.o. Benjaman Lobe Primary Care Provider: [name] Other Clinician: Referring Provider: Treating Provider/[name]: Tressie Stalker in Treatment: 71 Constitutional Patient is hypertensive.. Pulse regular and within target range for patient.Marland Kitchen Respirations regular, non-labored and within target range.. Temperature is normal and within the target range for the patient.Marland Kitchen Appears in no distress. Notes Wound exam; left transmit and Achilles remain healed. The problematic area is over the right first metatarsal head remanent. I used a #3 curette to gently debride necrotic tissue and get the granulation surface bigger of these wounds still probes to bone. On the lateral part also required a debridement. This is not an ominous area. Erythema and swelling in the area seems better to me. Electronic Signature(s) Signed: 10/25/2021 4:31:34 PM By: [name] MD Entered By: [name] on 10/25/2021 10:23:18 -------------------------------------------------------------------------------- Physician Orders Details Patient Name: Date of Service: [name], [place]. 10/25/2021 8:30 A M Medical Record Number: [id number] Patient Account Number: [id number] Date of Birth/Sex: Treating RN: [birth year] (81 y.o. [name] Primary Care Provider: [name] Other Clinician: Referring Provider: Treating Provider/[name]: Tressie Stalker in Treatment: [phone] Verbal / Phone Orders: No Diagnosis Coding ICD-10 Coding Code Description E11.621 Type 2 diabetes mellitus with foot ulcer T81.31XD Disruption of external operation (surgical) wound, not elsewhere classified, subsequent encounter L97.514 Non-pressure chronic ulcer of other part of right foot with necrosis  of bone L97.428 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 E11.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 Follow-up Appointments ppointment in 1 week. - with Dr. Dellia Nims Return A Bathing/ Shower/ Hygiene May shower with protection but do not get wound dressing(s) wet. Edema Control - Lymphedema / SCD / Other Elevate legs to the level of the heart or above for 30 minutes daily and/or when sitting, a frequency of: - throughout the day Avoid standing for long periods of time. Home Health No change in wound care orders this week; continue Home Health for wound care. May utilize formulary equivalent dressing for wound treatment orders unless otherwise specified. Dressing changes to be completed by [facility] on Monday / Wednesday / Friday except when patient has scheduled visit at [facility]. Other Home Health Orders/Instructions: - [facility] Wound Treatment Wound #15 - Amputation Site - Transmetatarsal Wound Laterality: Right Cleanser: Soap and Water Every Other Day/30 Days Discharge Instructions: May shower and wash wound with dial antibacterial soap and water prior to dressing change. Cleanser: Wound Cleanser [facility]) Every Other Day/30 Days Discharge Instructions: Cleanse  the wound with wound cleanser prior to applying a clean dressing using gauze sponges, not tissue or cotton balls. Prim Dressing: Promogran Prisma Matrix, 4.34 (sq in) (silver collagen) (Home Health) Every Other Day/30 Days ary Discharge Instructions: Moisten collagen with [place] Jelly Secondary Dressing: Woven Gauze Sponges 2x2 in [facility]) Every Other Day/30 Days Discharge Instructions: Apply over primary dressing as directed. Secured With: [employer], 4.5x3.1 (in/yd) [facility]) Every Other Day/30 Days Discharge Instructions: Secure with Kerlix as directed. Secured With: Transpore Surgical Tape, 2x10 (in/yd) [facility])  Every Other Day/30 Days Discharge Instructions: Secure dressing with tape as directed. Wound #17 - Foot Wound Laterality: Right, Lateral Cleanser: Soap and Water Every Other Day/30 Days Discharge Instructions: May shower and wash wound with dial antibacterial soap and water prior to dressing change. Cleanser: Wound Cleanser [facility]) Every Other Day/30 Days Discharge Instructions: Cleanse the wound with wound cleanser prior to applying a clean dressing using gauze sponges, not tissue or cotton balls. Prim Dressing: Promogran Prisma Matrix, 4.34 (sq in) (silver collagen) (Home Health) Every Other Day/30 Days ary Discharge Instructions: Moisten collagen with [place] Jelly Secondary Dressing: Woven Gauze Sponges 2x2 in [facility]) Every Other Day/30 Days Discharge Instructions: Apply over primary dressing as directed. Secured With: [employer], 4.5x3.1 (in/yd) [facility]) Every Other Day/30 Days Discharge Instructions: Secure with Kerlix as directed. Secured With: Transpore Surgical Tape, 2x10 (in/yd) [facility]) Every Other Day/30 Days Discharge Instructions: Secure dressing with tape as directed. Wound #18 - Lower Leg Wound Laterality: Right, Medial Cleanser: Soap and Water Every Other Day/30 Days Discharge Instructions: May shower and wash wound with dial antibacterial soap and water prior to dressing change. Cleanser: Wound Cleanser [facility]) Every Other Day/30 Days Discharge Instructions: Cleanse the wound with wound cleanser prior to applying a clean dressing using gauze sponges, not tissue or cotton balls. Prim Dressing: KerraCel Ag Gelling Fiber Dressing, 2x2 in (silver alginate) (Home Health) (Generic) Every Other Day/30 Days ary Discharge Instructions: Apply silver alginate to wound bed as instructed Secondary Dressing: Bordered Gauze, 2x2 in ([facility]) (Generic) Every Other Day/30 Days Discharge Instructions: Apply over primary dressing as directed. Electronic  Signature(s) Signed: 10/25/2021 4:31:34 PM By: [name] MD Signed: 10/25/2021 5:56:18 PM By: [name] RN, BSN Entered By: [name] on 10/25/2021 09:32:55 -------------------------------------------------------------------------------- Problem List Details Patient Name: Date of Service: [name], [name] LLY H. 10/25/2021 8:30 A M Medical Record Number: [id number] Patient Account Number: [id number] Date of Birth/Sex: Treating RN: [birth year] (81 y.o. [name] Primary Care Provider: [name] Other Clinician: Referring Provider: Treating Provider/[name]: Tressie Stalker in Treatment: 18 Active Problems ICD-10 Encounter Code Description Active Date MDM Diagnosis E11.621 Type 2 diabetes mellitus with foot ulcer 06/15/2021 No Yes T81.31XD Disruption of external operation (surgical) wound, not elsewhere classified, 06/15/2021 No Yes subsequent encounter L97.514 Non-pressure chronic ulcer of other part of right foot with necrosis of bone 09/20/2021 No Yes L97.428 Non-pressure chronic ulcer of left heel and midfoot with other specified 06/15/2021 No Yes severity E11.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 06/15/2021 No Yes 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, right ankle and foot 10/25/2021 No Yes Inactive Problems ICD-10 Code Description Active Date Inactive Date L97.528 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ity 06/15/2021 06/15/2021 Resolved Problems Electronic Signature(s) Signed: 10/25/2021 4:31:34 PM By: [name] MD Entered By: [name] on 10/25/2021 10:27:41 -------------------------------------------------------------------------------- Progress Note Details Patient Name: Date of  Service: [name] 10/25/2021 8:30 A M Medical Record Number: [id number] Patient Account Number: [id number] Date of Birth/Sex: Treating RN: 1941/05/01 (81  y.o. [name], [name] Primary Care Provider: [name] Other Clinician: Referring Provider: Treating Provider/[name]: Tressie Stalker in Treatment: 18 Subjective History of Present Illness (HPI) ADMISSION 07/13/2019 Patient is a 81 year old type II diabetic. She has known PAD. She has been followed by Dr. Jacqualyn Posey of podiatry for blistering areas on her toes dating back to 04/30/2019 which at this was the left fourth toe. By 8/11 she had wounds on the right and left second toes. She underwent arterial studies by Dr. Alvester Chou on 7/31 that showed ABIs on the right of 0.60 TBI of 0.26 on the left ABI of 0.56 and a TBI of 0.25. By 9/10 she had ischemic-looking wounds per Dr. Earleen Newport on the right first, left first second and third. She has been using Medihoney and then mupirocin more recently simply Betadine. The patient underwent angiography by Dr. Gwenlyn Found on 9/21. 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 Noted that there was 1 vessel runoff in the foot via the peroneal. ooOn the left there was an 80% left SFA, 70% mid left SFA. There was a short segment calcified CTO distal less than SFA and above-knee popliteal artery reconstituting with two-vessel runoff. 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 It was felt that she had bilateral SFA disease as well as tibial vessel disease. An attempt was made to revascularize the left SFA but they were unable to cross because of the highly calcified nature of the lesion. 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 She has an area on the medial left fourth toe with raised horned callus on top of this. I am not certain what this represents. With regards to pain she has about a 2-1/2 I  will claudication tolerance in the grocery store. She has some pain in night which is relieved by putting her feet down over the side of the bed. She is wearing Nitro-Dur patches on the top of her right foot. Past medical history includes type 2 diabetes with secondary PAD, neoplasm of the skin, diabetic retinopathy, carotid artery stenosis, hyperlipidemia hypertension. 11/2; the last time the patient was here I spoke to Dr. Gwenlyn Found about revascularization options on the right. As I understand things currently Dr. Gwenlyn Found spoke with Dr. Trula Slade and ultimately the patient was taken to surgery on 10/27. She had a right iliofemoral endarterectomy with a bovine patch angioplasty. I think the plan now is for her to have a staged intervention on the right SFA by Dr. Alvester Chou. Per the patient's understanding Dr. Gwenlyn Found and Dr. Jacqualyn Posey are waiting to see when the gangrenous toes on the right foot can be amputated. The patient states her pain is a lot better and she is grateful for that. 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 Small area on the left fourth toe. She is using Betadine to the mummified areas on the right and Medihoney on the left 08/27/2019; the last time I saw this patient I discharged her from the clinic. She had been revascularized by Dr. Trula Slade and she had a right iliofemoral endarterectomy with a bovine angioplasty.  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 Jacqualyn Posey of podiatry on 08/07/2019. I note that she also had a intervention by Dr. Gwenlyn Found and he performed a directional atherectomy and drug-coated balloon angioplasty of the SFA and popliteal artery on the right. I am not certain of the date of Dr. Kennon Holter procedure as of the time of this dictation. She was referred back to [place] by Dr. Earleen Newport predominantly for follow-up of the left fourth toe. She still has sutures and stitches in the right TMA site. She states  her pain is a lot better. She expresses concern about the condition of the amputation site at the TMA. She is on doxycycline I think prescribed by Dr. Earleen Newport. She is complaining of some pain at night 12/10; I spoke to Dr. Jacqualyn Posey last week. He removed the sutures on the right foot on Monday of this week. She has the area on the left fourth toe just proximal to the PIP and then the right TMA site. She is still on doxycycline and has enough through next week. Unfortunately the TMA site does not look good at all. Both on the lateral and medial part of the incisions are areas that probe to bone. There is purulence over the medial part which I have cultured. We will use silver alginate. Left fourth toe looks somewhat better but there was still exposed bone 12/17; patient has an MRI booked for 12/30. Culture I did last week showed Pseudomonas Serratia and Enterococcus. This was purulent drainage coming out of the medial part of her amputation site. I use cefdinir 300 twice daily for 10 days that started on 12/15. Her x-ray on the right showed limited evaluation for osteomyelitis. The findings could have been postoperative. There was subtle erosion in the distal first and distal fifth metatarsal. An MRI was suggested. On the left she had irregularity of the fourth middle and proximal phalanx consistent with a history of osteomyelitis. I do not know that she has a history of osteomyelitis in this area. She had a newly defined area on the plantar third toe 12/31; patient's MRI is listed below: MRI OF THE RIGHT FOREFOOT WITHOUT AND WITH CONTRAST TECHNIQUE: Multiplanar, multisequence MR imaging of the right forefoot was performed before and after the administration of intravenous contrast. CONTRAST 6 mL GADAVIST IV SOLN : COMPARISON: Plain films right foot 09/04/2019. FINDINGS: Bones/Joint/Cartilage The patient is status post transmetatarsal amputation as seen on the prior exam. Marrow edema and  enhancement are seen in all of the distal metatarsals. In the first metatarsal, signal change extends 1.5 cm proximal to the stump and in the second metatarsal extends approximately 2 cm proximal to the stump. Edema and enhancement are seen in only the distal 0.7 cm of the third metatarsal stump and tips of the fourth and fifth metatarsals. Bone marrow signal is otherwise unremarkable. A small focus of subchondral edema is seen in the lateral talus, likely degenerative. Ligaments Intact. Muscles and Tendons No intramuscular fluid collection. Soft tissues Skin ulceration is seen off the stump of the first metatarsal. A thin fluid collection tracks deep to the wound and over the anterior metatarsals worrisome for small abscess. Intense subcutaneous edema and enhancement are seen diffusely. 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 Cellulitis about the foot. Skin ulceration over the distal first metatarsal is identified with a thin fluid collection tracking  anteriorly along the stump worrisome for abscess. Small focus of subchondral edema in the lateral talus is likely degenerative. Electronically Signed By: Inge Rise M.D. On: 09/23/2019 15:25 Patient arrives in clinic today not complaining any of any pain. 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 She does not describe claudication however her activity is very limited. 10/01/2019; since the patient was last here I have communicated with Dr. Gwenlyn Found after bypass by Dr. Trula Slade and addressing the right superficial femoral artery he states that she is widely patent through peroneal arteries to the ankle with collaterals to the dorsalis pedis. He states he is going to talk to colleagues about the feasibility of tibial  pedal access. The patient seems infectious disease later this afternoon Dr. Baxter Flattery. in preparation for this she has been off antibiotics for 1 week and I went ahead and obtain pieces of the remanence of her first metatarsal for pathology and CandS. The patient is a candidate for hyperbaric oxygen. 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 1/15; considerable improvement in the wounds on her feet. We are using silver collagen. She follows with Dr. Baxter Flattery of infectious disease and is on meropenem and daptomycin. She has been taught how to do this herself at home. Follows with Dr. Graylon Good at the end of treatment here. She has 2 wounds on the surgical TMA site 1 lateral and 1 medial the lateral 1 has regressed tremendously. The area medially still has some exposed bone although the base of this looks healthy. 1/22; 2 separate wounds on the surgical TMA site. Both of these look satisfactory. The medial area does not have any exposed bone. This is an improvement On the left side fourth toe dorsally over the proximal phalanx there is a deep punched out area that probes to bone. She has an area on the tip of the left third toe. She also tells [place] that in HBO she traumatized her left Achilles and this is left her with a superficial wound area 1/28; weekly visit along with HBO. She has 5 wounds. T our punched out areas on the original TMA site on the right. Both of these appear to have contracted. o The area on the right no longer has exposed bone. She has an area on the tip of the left third toe and on the DIP of the left fourth toe. Both of these had surface callus that I removed and unfortunately they have small areas that both go to bone. She has a traumatic wound on the left Achilles area that happened in HBO and that appears better. She is tolerating her IV antibiotics well at home. She has home health changing the dressings and she is doing it once on the  weekends. We have been using silver collagen. She has been extensively revascularized on the right by Dr. Trula Slade and subsequently by Dr. Gwenlyn Found. 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 Baxter Flattery of infectious disease on 2/17. Dr. Gwenlyn Found on 2/23 and according to her her antibiotics stopped on 2/23. She is tolerating hyperbarics well. She has made a tremendous improvement in the right forefoot with only 2 smaller open wounds. Using silver collagen. On the left foot she has the area on the tip of the left third toe  and the medial part of the left fourth toe. These had exposed bone last week I did not sense any of that today 2/12; sees Dr. Graylon Good on 2/17 and Dr. Gwenlyn Found on 2/23. We are going to use Dermagraft on this today however the lateral part of her train TMA incision on the right is healed and the medial part is down so much that we just continue with the silver collagen She has wounds on the tip of her left third and the medial aspect of the left fourth. Both of these still have exposed bone I have not been able to get these to epithelialize. 2/19; she sees Dr. Gwenlyn Found on 2/23 and I have communicated with him about the left vascular supply. Looking at her angiogram from 08/03/2019 it looks as though that they could not cross the left CFA. Noted that she had one-vessel runoff bilaterally. She also apparently saw Dr. Graylon Good although I did not look up these results for preparation of this record. 2/26; Dr. Gwenlyn Found is going to do an angiogram next week on Wednesday. I think they are going to try to go at this both retrograde and anterograde to see if anything can be done to revascularize the left lower limb. She continues to make nice progress on the remaining wound on the right medial foot on her TMA site and the toe it wounds on the left are responding nicely as well 3/12; the  patient underwent an extensive and complicated revascularization on the left leg by Dr. Fletcher Anon on 3/3; she had an atherectomy on occluded left popliteal artery; anterior tibial artery followed by a balloon angioplasty. Atherectomy was also performed to the left SFA because there was still significant 50% stenosis in the left popliteal artery they performed an intravascular lithotripsy which improved the residual stenosis to 20%. The same lithotripsy was used to dilate the proximal left SFA. She had a drug-eluting stent placed I believe in the SFA. The patient returned for hyperbarics this week. She has had some eye problems on the right which she tells me are secondary to diabetic retinopathy and she saw her eye doctor. She is really made excellent progress there is no open wounds on the left foot at all. She has one open area medially and her TMA site on the right lateral wound is closed. 3/19; the patient comes in today with the area on the medial transmet on the right improved. Some debris removed from the surface revealed the still open wound. ooUnfortunately she now has the open area on the third toe tip and the medial aspect of the dorsal fourth toe. These are in the same location as her previous wounds. These had actually closed up last week. 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 She is undergone complex revascularizations on the right by Dr. Trula Slade and Dr. Gwenlyn Found and I believe Dr. 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 She completed IVantibiotics as directed by infectious disease and I believe a course of oral antibiotics as well.she underwent a complete 40 treatments of hyperbarics. She comes in  today with the vast majority of the right TMA site healed there is a small opening on the most medial aspect. Her left third and left  fourth toes appear to have epithelialization although this has fluctuated somewhat. 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 She had a gaping wound on the right transmetatarsal amputation L as well as deep wounds on the left third and left fourth toes. She underwent complex revascularizations on her bilateral lower legs which I described on her last visit. She completed IV antibiotics by infectious disease and 40 treatments of hyperbarics. Her left third and fourth toes are healed. The area on the right transmetatarsal amputation site is also closed today. READMISSION 08/04/2020 [name] is now a 81 year old woman that we had for a complex stay in our clinic from October 2000 14 January 2020. 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 Her current problems with wounds began on October 30 when she developed a spontaneous opening over the right transmet medial aspect over the first metatarsal head. She has been using collagen on this that she had leftover from her last stay in this clinic. She also has had an open area on the medial right calf from a vein harvest site from her CABG she. She said that this is never really closed since the vein harvest was done. With regards to her arterial status this is a major issue. She had an angiogram by Dr. Gwenlyn Found in September 2020. She developed a gangrenous right first and second toes. Ultimately she was referred to Dr. Trula Slade who did a right common femoral endarterectomy with patch angioplasty. This was on 07/21/2019 and she really had a good result. Dr. Gwenlyn Found did a atherectomy followed by a drug coated balloon angioplasty of her right SFA popliteal and tibial peroneal trunk  on 08/03/2019 again with excellent excellent results. Follow-up.Dopplers in November revealed revealed a widely patent SFA and tibioperoneal trunk. UNFORTUNATELY her recent angiogram that was done on 08/01/2020 showed an 80% right common femoral artery stenosis just above the previous endarterectomy site occluded right right SFA at the origin reconstituting the adductor canal by the profunda femoris collaterals. The profunda femoris is also diffusely diseased. There is and again another 80% tibioperoneal trunk stenosis and one-vessel runoff via the peroneal artery. She is seen Dr. Donzetta Matters and she is scheduled for a femoral endarterectomy and right femoral-popliteal bypass grafting with endarterectomy of her tibial peroneal trunk. The scheduled surgery is on 11/23 The patient does not complain of pain at rest. She does have 5-minute walking claudication. The wounds on her right first met head remanent was spontaneous she does not think she hit this. As mentioned in the area on her right medial calf is a vein harvest site Dr. Gwenlyn Found had texted me about getting her in the clinic and we have arranged this as soon as we can. I have told her that we will have to see how successful Dr. Donzetta Matters is before we know what can be done about the wounds in a precise fashion. Fortunately there does not seem to be any obvious infection involving the right foot although that may need to be looked at if things really deteriorate. She was treated with IV antibiotics and hyperbaric oxygen for her previous osteomyelitis 12/2; the patient underwent a right femoral endarterectomy and bypass femoral to popliteal artery. This was done on 08/16/2020. She is done remarkably well. She has a small area on the right anterior tibia and a small area in the  medial part of her original right transmetatarsal amputation. She appears to have had an excellent response to surgery. 12/16; the patient's area on the right tibia and the small area on  the medial part of her original right transmetatarsal amputation is totally healed READMISSION 06/15/2021 [name] is a now 81 year old woman who arrives for review of wounds on the left TMA site extending into her plantar foot as well as an area on the left posterior calcaneus. 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 She was extensively revascularized on the right at that time as noted above in our previous notes ended up receiving IV antibiotics and hyperbaric oxygen therapy. Miraculously this TMA site actually closed and she really has had not any trouble with this since. UNFORTUNATELY the same cannot be said of her left side. Her problem apparently started sometime in July when she hit her left third toe on a barstool while playing pool. She developed an acute infection which eventually led to underlying osteomyelitis. She underwent an extensive hospitalization from 04/27/2021 through 05/15/2021. She received IV antibiotics. Also extensive intervention by Dr. Stanford Breed of vein and vascular which included a left common femoral to tibioperoneal trunk bypass as well as a left third toe amputation on 05/03/2021. Unfortunately the area did not heal she ended up requiring a left TMA. By her description this was left open. She has been using a wound VAC on this to earlier this week and then was switched to Santyl. I think because of the left forefoot off loader she is also developed an area on the left posterior calcaneus. She is not complaining of a lot of pain. She has her daughter at home to change the dressings. She has been prescribed Santyl and she has a tube that she is brought into our clinic. I cannot get a lot of history of claudication although she is really not that mobile at this point walking with a walker. 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 She has had multiple interventions on the right side by vein and vascular and also by Dr. Gwenlyn Found as well as a transmetatarsal amputation on the right. She has had follow-up noninvasive studies on 06/06/2021; on the left that showed an ABI of 0.47 with monophasic waveforms a dorsalis pedis ABI of 0.40 with monophasic waveforms. Her ANGIOGRAM which was initially done by Dr. Oneida Alar on 04/28/2021 showed severe left foot superficial femoral artery occlusive disease and an occlusion of the left popliteal artery with two-vessel runoff to the left foot with moderate to severe disease below the knee popliteal artery and tibial disease. She had 80% distal right external iliac artery above the existing femoral above-knee popliteal bypass but she does not have an open wound on the right foot She arrives in clinic with an extensive open area on the TMA site which in the mid aspect extends into the plantar foot. She also has a more superficial area on the upper part of her Achilles area of the left heel 9/29; patient arrives in clinic today with slough over the TMA site. We use MolecuLight to look at the surface of this suggesting cyan and white discoloration over a large part of this wound also extending into the third metatarsal area. She also has the area on the left posterior  calcaneus. This is slough covered we will switch to [facility] here as well. There is some warmth in the area and some erythema we will give her antibiotics as well 10/6; completely necrotic surface once again on the left TMA site. We have been using Santyl and Hydrofera Blue. She has an area on the left posterior Achilles and actually states this has been more painful this week indeed there is some erythema around this area. She also describes I think claudication at rest which is relieved by dipping her left leg over the side of the bed at night. 10/13; our  intake nurse was able to brush some slough off the surface of the left TMA hence this did not require debridement that is better than last week. The area heading towards the plantar part of her foot required an aggressive debridement to clean this up. This is quite a sizable divot but does not go to bone. The area on the right Achilles heel is also covered in a high 100% nonviable tissue we have been using Santyl and silver alginate 10/20; the area on the left TMA looks much better. Even the area is spreading into the midfoot looks improved. The right Achilles still is covered and nonviable surface 100%. We have been using Santyl and silver alginate and this will continue for this week The patient is approved for 5 Apligraf's and I think she is good enough to start using these next week 10/27; the patient's wounds actually look quite good. We have been using Santyl and silver alginate. We applied Apligraf #1 today Saw Dr. Stanford Breed her vascular surgeon on 10/25 he told her TMA was healing nicely. She will follow-up with them in December to repeat noninvasive testing. 08/16/2021 upon evaluation today patient appears to be doing much better in regard to her wounds. Both are showing signs of improvement which is great news. I am going to perform some debridement to clear away some of the necrotic debris currently this will just be a light debridement. Nonetheless other than that I feel like that the Apligraf is doing quite well working and reapply today and this will be #3 as far as the application is concerned. 12/20; since the patient was last here she was seen by Dr. Stanford Breed who felt that her Doppler flow was sluggish in the bypass. She therefore went underwent a urgent angiogram on 09/07/2021 by Dr. Carlis Abbott. She had an angioplasty of the distal anastomosis and the anterior tibial artery. She tolerated this well. She had no flow-limiting stenosis in the aortoiliac segment on the left. Some calcification in  the left common iliac artery but it was not flow-limiting the common femoral and profunda were patent. The left lower extremity bypass had brisk flow with no significant proximal anastomosis on the common femoral artery. Distally there was a high-grade stenosis where the bypass anastomosis was performed. This was angioplastied as well as the anterior tibial artery. 12/28; the patient comes in early because of a wound over the tip of her right first metatarsal head. She is not really sure how this happened. Says she noticed it when she took her sock off on Friday before she got in the shower. She has been using Hydrofera Blue to this area. This is the original wounded site we dealt with in the setting of a previous right TMA. The patient has PA I researched this on [place] link. As it turns out she has had recent arterial studies noninvasive on 09/05/2021. On the right she  had dampened monophasic waveforms at the popliteal absent waveforms at the PTA and dorsalis pedis. An ABI could not be obtained previously at 0.47. The comment states that they were unable to detect Doppler waveforms of the posterior tibial and dorsalis pedis arteries. 2023 09/26/2021; her original left TMA wound is almost closed. I reapplied Apligraf to the remaining area. The area on the plantar foot is also closed as far as I can tell HOWEVER she comes in with a second wound on the right first metatarsal head remanent both of these probe to bone. I did a bone scraping of this last week which only showed Corynebacterium unlikely to be a true pathogen. Nevertheless I am going to put her on Augmentin today probably for 2 weeks. X-ray will be done today. She has an appointment with Dr. Gwenlyn Saran at vein and vascular next Tuesday I will send him a note about my concerns about the right TM 1/10; we inadvertently remove the Apligraf from the left surgical site however everything looks closed here. She still has 2 probing wounds over the  remanent of her first metatarsal head both go to bone although there is less erythema and swelling in the area. She does not have an appointment with Dr. Donzetta Matters until next Tuesday. I am going to go ahead and order an MRI of the right foot I had originally planned to have or at least consider HBO if the patient has underlying osteomyelitis in the right foot however she tells me that she lost a significant amount of her visual field during her last run with HBO in the right eye nasal aspect. She saw Dr. Zadie Rhine who is a retinal specialist I will see if I can look at his records. She is still on Augmentin as finishes on Monday. 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 She has been on Augmentin with some improvement in the surrounding erythema although the small wound on the tip and the plantar aspect still probe easily to bone and there does not appear to be any viable tissue. UNFORTUNATELY arrives in clinic today with new areas on the right lateral foot neither 1 of these look particularly healthy. No debridement. The patient has an appoint with Dr. Donzetta Matters this morning my question is angiography of the right lego. I also wonder about an infectious disease appointment and I will continue her Augmentin but refer her for consideration. I did not get a culture that was positive even from a bone scraping however. 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 She saw her retina doctor Dr. Deloria Lair and her interpretation of this was that the hyperbarics may have contributed to a blockage. I wonder whether this was a central  retinal vein or artery occlusion. I am not sure that Dr. Zadie Rhine documents in epic I will need to see if I can find his note 1/25; this is [name] returns to clinic having a very eventful week. Firstly she underwent angiography by Dr. Stanford Breed of vein and vascular.. Her preintervention ABI was 0. She underwent a right iliofemoral drug-coated balloon angioplasty, right popliteal drug-coated balloon angioplasty and a right  tibial peroneal trunk and peroneal angioplasty by Dr. Stanford Breed. She tolerated this well. She definitely has better blood flow to the right foot. She saw Dr. Doren Custard in follow-up who felt her Doppler flow in the right foot was good. An x-ray did not show any evidence of osteomyelitis however I noted a previous MRI certainly did. She also saw infectious diseases been started on IV daptomycin and ertapenem. She had the PICC line placed. I had started her on Augmentin on February 3 and that had some improvement in the erythema and swelling over the remanent of her first metatarsal head. As far as I am aware she did not have a specific organism cultured at least not by me although I did try. Everything on the left is closed including the left transmit amputation site and the area on her left posterior heel/Achilles. We are using silver collagen on the right foot now which has 2 open wounds on the right first metatarsal head and the right fifth metatarsal head 2/1; the patient's left foot transmit site and the area on her heel remain closed. Some eschar but I do not think this requires debridement She has areas on the right first and right fifth met heads underlying osteomyelitis she is on IV daptomycin and ertapenem. The areas are small but probe to bone. I have debrided with a #3 curette hemostasis with direct pressure I am able to get to some decent looking surrounding granulation tissue on the more worrisome areas on the right first metatarsal head Objective Constitutional Patient is  hypertensive.. Pulse regular and within target range for patient.Marland Kitchen Respirations regular, non-labored and within target range.. Temperature is normal and within the target range for the patient.Marland Kitchen Appears in no distress. Vitals Time Taken: 8:45 AM, Height: 64 in, Weight: 135 lbs, BMI: 23.2, Temperature: 97.5 F, Pulse: 99 bpm, Respiratory Rate: 16 breaths/min, Blood Pressure: 189/84 mmHg, Capillary Blood Glucose: 117 mg/dl. General Notes: glucose per pt report this AM General Notes: Wound exam; left transmit and Achilles remain healed. The problematic area is over the right first metatarsal head remanent. I used a #3 curette to gently debride necrotic tissue and get the granulation surface bigger of these wounds still probes to bone. On the lateral part also required a debridement. This is not an ominous area. Erythema and swelling in the area seems better to me. Integumentary (Hair, Skin) Wound #15 status is Open. Original cause of wound was Other Lesion. The date acquired was: 09/15/2021. The wound has been in treatment 5 weeks. 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 The wound measures 0.4cm length x 1cm width x 0.2cm depth; 0.314cm^2 area and 0.063cm^3 volume. There is Fat Layer (Subcutaneous Tissue) exposed. There is no tunneling or undermining noted. There is a medium amount of serosanguineous drainage noted. The wound margin is distinct with the outline attached to the wound base. There is small (1-33%) pale granulation within the wound bed. There is a large (67-100%) amount of necrotic tissue within the wound bed including Adherent Slough. Wound #16 status is Open. Original cause of wound was Gradually Appeared. The date acquired was: 09/26/2021. The wound has been in treatment 4 weeks. The wound is located on the [place]. The wound measures 0cm length x 0cm width x 0cm depth; 0cm^2 area and 0cm^3 volume. There is no tunneling or undermining noted. There  is a none present amount of drainage noted. The wound margin is flat and intact. There is no granulation within the  wound bed. There is no necrotic tissue within the wound bed. Wound #17 status is Open. Original cause of wound was Not Known. The date acquired was: 10/10/2021. The wound has been in treatment 2 weeks. The wound is located on the Right,Lateral Foot. The wound measures 0.1cm length x 0.2cm width x 0.1cm depth; 0.016cm^2 area and 0.002cm^3 volume. There is Fat Layer (Subcutaneous Tissue) exposed. There is no tunneling or undermining noted. There is a medium amount of serosanguineous drainage noted. The wound margin is flat and intact. There is large (67-100%) pink, pale granulation within the wound bed. There is no necrotic tissue within the wound bed. Wound #18 status is Open. Original cause of wound was Gradually Appeared. The date acquired was: 10/22/2021. The wound is located on the Right,Medial Lower Leg. The wound measures 0.5cm length x 0.5cm width x 0.1cm depth; 0.196cm^2 area and 0.02cm^3 volume. There is Fat Layer (Subcutaneous Tissue) exposed. There is no tunneling or undermining noted. There is a medium amount of serous drainage noted. The wound margin is flat and intact. There is large (67-100%) pink granulation within the wound bed. There is no necrotic tissue within the wound bed. 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 There was a Excisional Skin/Subcutaneous Tissue Debridement with a total area of 0.4 sq cm performed by Ricard Dillon., MD. With the following instrument(s): Curette to remove Viable and Non-Viable tissue/material. Material removed includes Subcutaneous Tissue and Slough and. No specimens were taken. A time out was conducted at 09:27, prior to the start of the procedure. A Minimum amount of bleeding was controlled with Pressure. The procedure was tolerated well with a pain level of 0 throughout and a pain level of 0 following the procedure. Post Debridement Measurements: 0.4cm length x 1cm width x 0.2cm depth; 0.063cm^3 volume. Character of Wound/Ulcer Post Debridement is improved. Severity of Tissue Post Debridement is: Fat layer exposed. Post procedure Diagnosis Wound #15: Same as Pre-Procedure Wound #17 Pre-procedure diagnosis of Wound #17 is an Abrasion located on the Right,Lateral Foot . There was a Excisional Skin/Subcutaneous Tissue Debridement with a total area of 0.02 sq cm performed by Ricard Dillon., MD. With the following instrument(s): Curette to remove Viable and Non-Viable tissue/material. Material removed includes Subcutaneous Tissue and Slough and. No specimens were taken. A time out was conducted at 09:27, prior to the start of the procedure. A Minimum amount of bleeding was controlled with Pressure. The procedure was tolerated well with a pain level of 0 throughout and a pain level of 0 following the procedure. Post Debridement Measurements: 0.1cm length x 0.2cm width x 0.1cm depth; 0.002cm^3 volume. Character of Wound/Ulcer Post Debridement is improved. Post procedure Diagnosis Wound #17: Same as Pre-Procedure Plan Follow-up Appointments: Return Appointment in 1 week. - with Dr. Arcola Jansky Shower/ Hygiene: May shower with protection but do not get wound dressing(s) wet. Edema  Control - Lymphedema / SCD / Other: Elevate legs to the level of the heart or  above for 30 minutes daily and/or when sitting, a frequency of: - throughout the day Avoid standing for long periods of time. Home Health: No change in wound care orders this week; continue Home Health for wound care. May utilize formulary equivalent dressing for wound treatment orders unless otherwise specified. Dressing changes to be completed by [place] on Monday / Wednesday / Friday except when patient has scheduled visit at [facility]. Other Home Health Orders/Instructions: - [facility] WOUND #15: - Amputation Site - Transmetatarsal Wound Laterality: Right Cleanser: Soap and Water Every Other Day/30 Days Discharge Instructions: May shower and wash wound with dial antibacterial soap and water prior to dressing change. Cleanser: Wound Cleanser [facility]) Every Other Day/30 Days Discharge Instructions: Cleanse the wound with wound cleanser prior to applying a clean dressing using gauze sponges, not tissue or cotton balls. Prim Dressing: Promogran Prisma Matrix, 4.34 (sq in) (silver collagen) (Home Health) Every Other Day/30 Days ary Discharge Instructions: Moisten collagen with [place] Jelly Secondary Dressing: Woven Gauze Sponges 2x2 in [facility]) Every Other Day/30 Days Discharge Instructions: Apply over primary dressing as directed. Secured With: [employer], 4.5x3.1 (in/yd) [facility]) Every Other Day/30 Days Discharge Instructions: Secure with Kerlix as directed. Secured With: Transpore Surgical T ape, 2x10 (in/yd) (Home Health) Every Other Day/30 Days Discharge Instructions: Secure dressing with tape as directed. WOUND #17: - Foot Wound Laterality: Right, Lateral Cleanser: Soap and Water Every Other Day/30 Days Discharge Instructions: May shower and wash wound with dial antibacterial soap and water prior to dressing change. Cleanser: Wound Cleanser [employer]) Every Other Day/30  Days Discharge Instructions: Cleanse the wound with wound cleanser prior to applying a clean dressing using gauze sponges, not tissue or cotton balls. Prim Dressing: Promogran Prisma Matrix, 4.34 (sq in) (silver collagen) (Home Health) Every Other Day/30 Days ary Discharge Instructions: Moisten collagen with [place] Jelly Secondary Dressing: Woven Gauze Sponges 2x2 in [facility]) Every Other Day/30 Days Discharge Instructions: Apply over primary dressing as directed. Secured With: [employer], 4.5x3.1 (in/yd) [facility]) Every Other Day/30 Days Discharge Instructions: Secure with Kerlix as directed. Secured With: Transpore Surgical T ape, 2x10 (in/yd) (Home Health) Every Other Day/30 Days Discharge Instructions: Secure dressing with tape as directed. WOUND #18: - Lower Leg Wound Laterality: Right, Medial Cleanser: Soap and Water Every Other Day/30 Days Discharge Instructions: May shower and wash wound with dial antibacterial soap and water prior to dressing change. Cleanser: Wound Cleanser [facility]) Every Other Day/30 Days Discharge Instructions: Cleanse the wound with wound cleanser prior to applying a clean dressing using gauze sponges, not tissue or cotton balls. Prim Dressing: KerraCel Ag Gelling Fiber Dressing, 2x2 in (silver alginate) (Home Health) (Generic) Every Other Day/30 Days ary Discharge Instructions: Apply silver alginate to wound bed as instructed Secondary Dressing: Bordered Gauze, 2x2 in ([place]) (Generic) Every Other Day/30 Days Discharge Instructions: Apply over primary dressing as directed. 1. 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 The larger and more problematic area is on the first metatarsal head. Debrided the surrounding areas to see if we can stimulate some granulation 2. She is on IV antibiotics as directed by infectious disease 3. I will see about putting her  through for hyperbaric oxygen in order to treat the underlying osteomyelitis. 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 She was seen by her own ophthalmologist as well as Dr. Deloria Lair who was a local retinal specialist. I reviewed Dr. 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 The patient is concerned that this could have had something to do with her hyperbaric oxygen treatment. If so I am not really familiar with this concern. I put in a call to Dr. Dahlia Bailiff office to discuss. Unless Dr. Zadie Rhine is able to tell me the cause-and-effect area I will probably try and put her through for hyperbaric oxygen therapy at 2 atm Electronic Signature(s) Signed: 10/25/2021 10:28:19 AM By: [name] MD Entered By: [name] on 10/25/2021 10:28:19 -------------------------------------------------------------------------------- SuperBill Details Patient Name: Date of Service: [name], [name] 10/25/2021 Medical Record Number: [id number] Patient Account Number: [id number] Date of Birth/Sex: Treating RN: 22-Aug-1941 (81 y.o. 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 E11.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 Facility Procedures CPT4 Code: [id number]  1 Description: 8472 - DEB SUBQ TISSUE 20 SQ CM/< ICD-10 Diagnosis Description L97.514 Non-pressure chronic ulcer of other part of right foot with necrosis of bone E11.51 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

## 2021-10-27 ENCOUNTER — Emergency Department (HOSPITAL_COMMUNITY)
Admission: EM | Admit: 2021-10-27 | Discharge: 2021-10-27 | Disposition: A | Discharge status: Home / Self Care | Payer: Medicare Other | Attending: Emergency Medicine | Admitting: Emergency Medicine

## 2021-10-27 ENCOUNTER — Emergency Department: Payer: Self-pay

## 2021-10-27 ENCOUNTER — Encounter (HOSPITAL_COMMUNITY): Payer: Self-pay | Admitting: *Deleted

## 2021-10-27 ENCOUNTER — Emergency Department (HOSPITAL_BASED_OUTPATIENT_CLINIC_OR_DEPARTMENT_OTHER): Admit: 2021-10-27 | Discharge: 2021-10-27 | Disposition: A | Discharge status: Home / Self Care | Payer: Medicare Other

## 2021-10-27 ENCOUNTER — Other Ambulatory Visit: Payer: Self-pay

## 2021-10-27 DIAGNOSIS — Z7901 Long term (current) use of anticoagulants: Secondary | ICD-10-CM | POA: Diagnosis not present

## 2021-10-27 DIAGNOSIS — T82898A Other specified complication of vascular prosthetic devices, implants and grafts, initial encounter: Secondary | ICD-10-CM | POA: Diagnosis not present

## 2021-10-27 DIAGNOSIS — E119 Type 2 diabetes mellitus without complications: Secondary | ICD-10-CM | POA: Insufficient documentation

## 2021-10-27 DIAGNOSIS — I10 Essential (primary) hypertension: Secondary | ICD-10-CM | POA: Diagnosis not present

## 2021-10-27 DIAGNOSIS — E11628 Type 2 diabetes mellitus with other skin complications: Secondary | ICD-10-CM | POA: Diagnosis not present

## 2021-10-27 DIAGNOSIS — L89620 Pressure ulcer of left heel, unstageable: Secondary | ICD-10-CM | POA: Diagnosis not present

## 2021-10-27 DIAGNOSIS — Z79899 Other long term (current) drug therapy: Secondary | ICD-10-CM | POA: Diagnosis not present

## 2021-10-27 DIAGNOSIS — Z7984 Long term (current) use of oral hypoglycemic drugs: Secondary | ICD-10-CM | POA: Diagnosis not present

## 2021-10-27 DIAGNOSIS — Z4781 Encounter for orthopedic aftercare following surgical amputation: Secondary | ICD-10-CM | POA: Diagnosis not present

## 2021-10-27 DIAGNOSIS — Y848 Other medical procedures as the cause of abnormal reaction of the patient, or of later complication, without mention of misadventure at the time of the procedure: Secondary | ICD-10-CM | POA: Insufficient documentation

## 2021-10-27 DIAGNOSIS — M868X7 Other osteomyelitis, ankle and foot: Secondary | ICD-10-CM | POA: Insufficient documentation

## 2021-10-27 DIAGNOSIS — T82594A Other mechanical complication of infusion catheter, initial encounter: Secondary | ICD-10-CM

## 2021-10-27 DIAGNOSIS — M869 Osteomyelitis, unspecified: Secondary | ICD-10-CM | POA: Diagnosis not present

## 2021-10-27 DIAGNOSIS — Z7982 Long term (current) use of aspirin: Secondary | ICD-10-CM | POA: Insufficient documentation

## 2021-10-27 DIAGNOSIS — D638 Anemia in other chronic diseases classified elsewhere: Secondary | ICD-10-CM | POA: Diagnosis not present

## 2021-10-27 DIAGNOSIS — I70202 Unspecified atherosclerosis of native arteries of extremities, left leg: Secondary | ICD-10-CM | POA: Diagnosis not present

## 2021-10-27 LAB — CBC WITH DIFFERENTIAL/PLATELET
Abs Immature Granulocytes: 0.03 10*3/uL (ref 0.00–0.07)
Basophils Absolute: 0.1 10*3/uL (ref 0.0–0.1)
Basophils Relative: 1 %
Eosinophils Absolute: 0.5 10*3/uL (ref 0.0–0.5)
Eosinophils Relative: 5 %
HCT: 33.1 % — ABNORMAL LOW (ref 36.0–46.0)
Hemoglobin: 10.4 g/dL — ABNORMAL LOW (ref 12.0–15.0)
Immature Granulocytes: 0 %
Lymphocytes Relative: 21 %
Lymphs Abs: 2 10*3/uL (ref 0.7–4.0)
MCH: 29.9 pg (ref 26.0–34.0)
MCHC: 31.4 g/dL (ref 30.0–36.0)
MCV: 95.1 fL (ref 80.0–100.0)
Monocytes Absolute: 0.6 10*3/uL (ref 0.1–1.0)
Monocytes Relative: 6 %
Neutro Abs: 6.4 10*3/uL (ref 1.7–7.7)
Neutrophils Relative %: 67 %
Platelets: 285 10*3/uL (ref 150–400)
RBC: 3.48 MIL/uL — ABNORMAL LOW (ref 3.87–5.11)
RDW: 15.9 % — ABNORMAL HIGH (ref 11.5–15.5)
WBC: 9.5 10*3/uL (ref 4.0–10.5)
nRBC: 0 % (ref 0.0–0.2)

## 2021-10-27 LAB — BASIC METABOLIC PANEL
Anion gap: 7 (ref 5–15)
BUN: 16 mg/dL (ref 8–23)
CO2: 26 mmol/L (ref 22–32)
Calcium: 9.8 mg/dL (ref 8.9–10.3)
Chloride: 104 mmol/L (ref 98–111)
Creatinine, Ser: 0.94 mg/dL (ref 0.44–1.00)
GFR, Estimated: >60 mL/min (ref >60)
Glucose, Bld: 232 mg/dL — ABNORMAL HIGH (ref 70–99)
Potassium: 3.9 mmol/L (ref 3.5–5.1)
Sodium: 137 mmol/L (ref 135–145)

## 2021-10-27 LAB — APTT: aPTT: 36 seconds (ref 24–36)

## 2021-10-27 LAB — PROTIME-INR
INR: 1.1 (ref 0.8–1.2)
Prothrombin Time: 13.8 seconds (ref 11.4–15.2)

## 2021-10-27 MED ORDER — SODIUM CHLORIDE 0.9% FLUSH: 10.0000 mL | INTRAVENOUS | Status: DC | PRN

## 2021-10-27 MED ORDER — SODIUM CHLORIDE 0.9% FLUSH: 10.0000 mL | Freq: Two times a day (BID) | INTRAVENOUS | Status: DC

## 2021-10-27 MED ORDER — CHLORHEXIDINE GLUCONATE CLOTH 2 % EX PADS: 6.0000 | MEDICATED_PAD | Freq: Every day | CUTANEOUS | Status: DC

## 2021-10-27 NOTE — Discharge Instructions (Signed)
You were seen today for an occluded PICC line. The line was replaced during the encounter. You may resume your home IV medication regimen. Return to the ED if you have further issues or develop life threats

## 2021-10-27 NOTE — ED Notes (Signed)
Vascular access team at bedside.

## 2021-10-27 NOTE — Progress Notes (Signed)
At bedside to assess PICC line for occlusion. Line found to be 3-4 cm out of place. Paged Cherlynn June, PA. New order received for PICC exchange.

## 2021-10-27 NOTE — Progress Notes (Signed)
Peripherally Inserted Central Catheter Placement  The IV Nurse has discussed with the patient and/or persons authorized to consent for the patient, the purpose of this procedure and the potential benefits and risks involved with this procedure.  The benefits include less needle sticks, lab draws from the catheter, and the patient may be discharged home with the catheter. Risks include, but not limited to, infection, bleeding, blood clot (thrombus formation), and puncture of an artery; nerve damage and irregular heartbeat and possibility to perform a PICC exchange if needed/ordered by physician.  Alternatives to this procedure were also discussed.  Bard Power PICC patient education guide, fact sheet on infection prevention and patient information card has been provided to patient /or left at bedside.    PICC Placement Documentation  PICC Single Lumen 10/27/21 Right Brachial 35 cm 0 cm (Active)  Indication for Insertion or Continuance of Line Home intravenous therapies (PICC only) 10/27/21 2009  Exposed Catheter (cm) 0 cm 10/27/21 2009  Site Assessment Clean;Dry;Intact 10/27/21 2009  Line Status Flushed;Blood return noted;Saline locked 10/27/21 2009  Dressing Type Transparent 10/27/21 2009  Dressing Status Clean;Dry;Intact 10/27/21 2009  Antimicrobial disc in place? Yes 10/27/21 2009  Dressing Change Due 11/03/21 10/27/21 2009       Scotty Court 10/27/2021, 8:12 PM

## 2021-10-27 NOTE — ED Provider Notes (Signed)
[facility] EMERGENCY DEPARTMENT Provider Note   CSN: [id number] Arrival date & time: 10/27/21  1633     History  Chief Complaint  Patient presents with   Vascular Access Problem    [name] is a 81 y.o. female.  Patient presents to the emergency department this afternoon with concerns about a clogged PICC line.  She has a PICC line in her upper right extremity for home administration of meropenem and daptomycin due to osteomyelitis in the left foot.  The patient was able to push her meropenem this morning but was unable to push daptomycin at approximately 1 PM this afternoon.  Home health was unable to flush the line.The patient has no other concerns at this time  HPI     Home Medications Prior to Admission medications   Medication Sig Start Date End Date Taking? Authorizing Provider  acetaminophen (TYLENOL) 500 MG tablet Take 1,000 mg by mouth every 6 (six) hours as needed for moderate pain or headache.    [provider]  amoxicillin-clavulanate (AUGMENTIN) 875-125 MG tablet Take 1 tablet by mouth 2 (two) times daily. 09/26/21   [provider]  aspirin EC 81 MG tablet Take 81 mg by mouth at bedtime.     [provider]  atorvastatin (LIPITOR) 80 MG tablet Take 1 tablet (80 mg total) by mouth daily. Patient must keep upcoming appointment to receive future refills. Patient taking differently: Take 80 mg by mouth every evening. Patient must keep upcoming appointment to receive future refills. 08/11/21   [name], MD  brimonidine (ALPHAGAN) 0.2 % ophthalmic solution Place 1 drop into the right eye 2 (two) times daily. 01/19/21   [provider]  CINNAMON PO Take 2 tablets by mouth in the morning, at noon, and at bedtime.     [provider]  empagliflozin (JARDIANCE) 25 MG TABS tablet Take 1 tablet (25 mg total) by mouth daily before breakfast. 10/05/21   Elayne Snare, MD  FEROCON capsule Take 1 capsule by mouth 2  (two) times daily. 08/15/21   [provider]  glimepiride (AMARYL) 2 MG tablet TAKE 1 TABLET BY MOUTH ONCE DAILY BEFORE BREAKFAST 10/19/21   Elayne Snare, MD  glucose blood (ONETOUCH VERIO) test strip Use to check blood sugars once daily 05/10/21   Elayne Snare, MD  hydrochlorothiazide (HYDRODIURIL) 12.5 MG tablet Take 1 tablet by mouth once daily 09/19/21   Elayne Snare, MD  Lancets (ONETOUCH DELICA PLUS [id number]) MISC Use to check blood sugars twice daily. 04/24/21   Elayne Snare, MD  metFORMIN (GLUCOPHAGE) 1000 MG tablet Take 1 tablet by mouth twice daily Patient taking differently: Take 1,000 mg by mouth in the morning. 02/17/21   Isaiah Serge, NP  metoprolol tartrate (LOPRESSOR) 25 MG tablet TAKE 1 & 1/2 (ONE & ONE-HALF) TABLETS BY MOUTH TWICE DAILY 07/11/21   [name], MD  oxyCODONE-acetaminophen (PERCOCET) 5-325 MG tablet Take 1 tablet by mouth every 6 (six) hours as needed for severe pain. 10/14/21 10/14/22  Baglia, Corrina, PA-C  pantoprazole (PROTONIX) 40 MG tablet Take 1 tablet (40 mg total) by mouth daily. Patient taking differently: Take 40 mg by mouth every Monday, Wednesday, and Friday. 10/19/20   [name], MD  Probiotic Product ([employer] SENIOR PROBIOTIC PO) Take 1 capsule by mouth every evening.    [provider]  Propylene Glycol (SYSTANE BALANCE) 0.6 % SOLN Place 1 drop into the left eye 3 (three) times daily as needed (dry/irritated  eyes).    [provider]  pyridoxine (B-6) 100 MG tablet Take 100 mg by mouth every evening.     [provider]  repaglinide (PRANDIN) 1 MG tablet Take 1 tablet (1 mg total) by mouth daily before breakfast. 10/05/21   Elayne Snare, MD  telmisartan (MICARDIS) 80 MG tablet Take 1 tablet by mouth once daily 04/19/21   [name], MD  XARELTO 2.5 MG TABS tablet Take 1 tablet by mouth twice daily 10/20/21   [name], MD      Allergies    Bactrim [sulfamethoxazole-trimethoprim], Demerol  [meperidine], Scopolamine, Tramadol, and Versed [midazolam]    Review of Systems   Review of Systems  Constitutional:  Negative for chills and fever.  Respiratory:  Negative for shortness of breath.   Cardiovascular:  Negative for chest pain.   Physical Exam Updated Vital Signs BP 120/64    Pulse 70    Temp 97.9 F (36.6 C) (Esophageal)    Resp 18    Ht 5' 0.5" (1.537 m)    Wt 65.3 kg    SpO2 99%    BMI 27.65 kg/m  Physical Exam Constitutional:      General: She is not in acute distress. HENT:     Head: Normocephalic.  Eyes:     Conjunctiva/sclera: Conjunctivae normal.  Cardiovascular:     Rate and Rhythm: Normal rate and regular rhythm.     Heart sounds: Normal heart sounds.  Pulmonary:     Effort: Pulmonary effort is normal.     Breath sounds: Normal breath sounds.  Musculoskeletal:     Cervical back: Normal range of motion.  Neurological:     Mental Status: She is alert.    ED Results / Procedures / Treatments   Labs (all labs ordered are listed, but only abnormal results are displayed) Labs Reviewed  BASIC METABOLIC PANEL - Abnormal; Notable for the following components:      Result Value   Glucose, Bld 232 (*)    All other components within normal limits  CBC WITH DIFFERENTIAL/PLATELET - Abnormal; Notable for the following components:   RBC 3.48 (*)    Hemoglobin 10.4 (*)    HCT 33.1 (*)    RDW 15.9 (*)    All other components within normal limits  PROTIME-INR  APTT    EKG None  Radiology UE VENOUS DUPLEX (7am - 7pm)  Result Date: 10/27/2021 UPPER VENOUS STUDY  Patient Name:  [name]  Date of Exam:   10/27/2021 Medical Rec #: [id number]      Accession #:    [id number] Date of Birth: 01/31/1941      Patient Gender: F Patient Age:   64 years Exam Location:  [facility] Procedure:      VAS [place] UPPER EXTREMITY VENOUS DUPLEX Referring Phys: [place] --------------------------------------------------------------------------------  Indications: PICC  line problem Risk Factors: Right PICC line. Comparison Study: No prior studies. Performing Technologist: Oliver Hum RVT  Examination Guidelines: A complete evaluation includes B-mode imaging, spectral Doppler, color Doppler, and power Doppler as needed of all accessible portions of each vessel. Bilateral testing is considered an integral part of a complete examination. Limited examinations for reoccurring indications may be performed as noted.  Right Findings: +----------+------------+---------+-----------+----------+-------+  RIGHT      Compressible Phasicity Spontaneous Properties Summary  +----------+------------+---------+-----------+----------+-------+  IJV            Full        Yes  Yes                         +----------+------------+---------+-----------+----------+-------+  Subclavian     Full        Yes        Yes                         +----------+------------+---------+-----------+----------+-------+  Axillary       Full        Yes        Yes                         +----------+------------+---------+-----------+----------+-------+  Brachial       Full        Yes        Yes                         +----------+------------+---------+-----------+----------+-------+  Radial         Full                                               +----------+------------+---------+-----------+----------+-------+  Ulnar          Full                                               +----------+------------+---------+-----------+----------+-------+  Cephalic       Full                                               +----------+------------+---------+-----------+----------+-------+  Basilic        Full                                               +----------+------------+---------+-----------+----------+-------+  Left Findings: +----------+------------+---------+-----------+----------+-------+  LEFT       Compressible Phasicity Spontaneous Properties Summary   +----------+------------+---------+-----------+----------+-------+  Subclavian     Full        Yes        Yes                         +----------+------------+---------+-----------+----------+-------+  Summary:  Right: No evidence of deep vein thrombosis in the upper extremity. No evidence of superficial vein thrombosis in the upper extremity.  Left: No evidence of thrombosis in the subclavian.  *See table(s) above for measurements and observations.    Preliminary    [place] EKG SITE RITE  Result Date: 10/27/2021 If [facility] image not attached, placement could not be confirmed due to current cardiac rhythm.   Procedures Procedures    Medications Ordered in ED Medications  sodium chloride flush (NS) 0.9 % injection 10-40 mL (has no administration in time range)  sodium chloride flush (NS) 0.9 % injection 10-40 mL (has no administration in time range)  Chlorhexidine Gluconate Cloth 2 % PADS 6 each (has no administration  in time range)    ED Course/ Medical Decision Making/ A&P                           Medical Decision Making  This patient presents to the ED for concern of a malfunctioning PICC line. The line is unable to be flushed. The patient requires this line for home administration of IV meropenem and daptomycin for her osteomyelitis.   Co morbidities that complicate the patient evaluation  None   Additional history obtained:   External records from outside source obtained and reviewed including hospital charts from 10/13/21   Lab Tests:  I Ordered, and personally interpreted labs.  The pertinent results include:  HgB 10.4, improved from last visit. Glucose 232, in range with previous results   Imaging Studies ordered:  I ordered imaging studies including upper extremity venous duplex  I independently visualized and interpreted imaging which showed no evidence of DVT I agree with the radiologist interpretation     Medicines ordered and prescription drug  management:  No medications administered during the visit I have reviewed the patients home medicines and have made adjustments as needed      Consultations Obtained:  None     Reevaluation:  After the interventions noted above, I reevaluated the patient and found that they have :resolved     Dispostion:  After consideration of the diagnostic results and the patients response to treatment, I feel that the patent would benefit from discharge home. The IV team was able to replace the patient's PICC line. She may resume her home medication regimen. Return precautions provided   Final Clinical Impression(s) / ED Diagnoses Final diagnoses:  Occlusion of peripherally inserted central catheter (PICC) line, initial encounter [facility])    Rx / [place] Orders ED Discharge Orders     None         Dorothyann Peng, [place] 10/27/21 2121    Noemi Chapel, MD 10/28/21 2121

## 2021-10-27 NOTE — Progress Notes (Signed)
Right upper extremity venous duplex has been completed. Preliminary results can be found in CV Proc through chart review.  Results were given to [facility] PA.  10/27/21 5:18 PM Carlos Levering RVT

## 2021-10-27 NOTE — ED Triage Notes (Signed)
The pt has a picc line in her rt upper arm and today the piss appears to be clotted off  a home health nurse was unable to unblock it  she is getting 2 meds through it for osteomyelitis

## 2021-10-27 NOTE — ED Provider Triage Note (Signed)
Emergency Medicine Provider Triage Evaluation Note  [name] , a 81 y.o. female  was evaluated in triage.  Pt complains of vascular access problem onset today.  She has a PICC line in place to her right upper extremity that she uses to get meropenem and daptomycin through for treatment for osteomyelitis.  She had her home health nurse come out today was unable to unblock the clot even with heparin.  Her home health nurse also notes that the PICC line was pulled out and externally pulled out 3.5 cm instead of 1 cm.  Patient denies chest pain, trouble breathing, fever, chills, redness.  She takes Xarelto.  Review of Systems  Positive: As per HPI above Negative: Chest pain, shortness of breath  Physical Exam  BP (!) 190/68 (BP Location: Left Arm)    Pulse 85    Temp 97.9 F (36.6 C) (Oral)    Resp 16    SpO2 99%  Gen:   Awake, no distress   Resp:  Normal effort  MSK:   Moves extremities without difficulty  Other:  PICC line in place to right upper extremity without overlying skin changes or drainage.  No tenderness to palpation noted to the area.  Radial pulses intact bilaterally, with mild decrease on right.   Medical Decision Making  Medically screening exam initiated at 4:44 PM.  Appropriate orders placed.  [name] was informed that the remainder of the evaluation will be completed by another provider, this initial triage assessment does not replace that evaluation, and the importance of remaining in the ED until their evaluation is complete.   Burak Zerbe A, PA-C 10/27/21 1648

## 2021-10-30 DIAGNOSIS — L89620 Pressure ulcer of left heel, unstageable: Secondary | ICD-10-CM | POA: Diagnosis not present

## 2021-10-30 DIAGNOSIS — Z4781 Encounter for orthopedic aftercare following surgical amputation: Secondary | ICD-10-CM | POA: Diagnosis not present

## 2021-10-30 DIAGNOSIS — I70202 Unspecified atherosclerosis of native arteries of extremities, left leg: Secondary | ICD-10-CM | POA: Diagnosis not present

## 2021-10-30 DIAGNOSIS — E11628 Type 2 diabetes mellitus with other skin complications: Secondary | ICD-10-CM | POA: Diagnosis not present

## 2021-10-30 DIAGNOSIS — I10 Essential (primary) hypertension: Secondary | ICD-10-CM | POA: Diagnosis not present

## 2021-10-30 DIAGNOSIS — D638 Anemia in other chronic diseases classified elsewhere: Secondary | ICD-10-CM | POA: Diagnosis not present

## 2021-10-31 ENCOUNTER — Encounter (HOSPITAL_COMMUNITY): Payer: Medicare Other

## 2021-11-01 ENCOUNTER — Other Ambulatory Visit: Payer: Self-pay

## 2021-11-01 ENCOUNTER — Encounter (HOSPITAL_BASED_OUTPATIENT_CLINIC_OR_DEPARTMENT_OTHER): Payer: Medicare Other | Admitting: Internal Medicine

## 2021-11-01 DIAGNOSIS — L97428 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: Secondary | ICD-10-CM | POA: Diagnosis not present

## 2021-11-01 DIAGNOSIS — L97528 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ity: Secondary | ICD-10-CM | POA: Diagnosis not present

## 2021-11-01 DIAGNOSIS — L97514 Non-pressure chronic ulcer of other part of right foot with necrosis of bone: Secondary | ICD-10-CM | POA: Diagnosis not present

## 2021-11-01 DIAGNOSIS — M86671 Other chronic osteomyelitis, right ankle and foot: Secondary | ICD-10-CM | POA: Diagnosis not present

## 2021-11-01 DIAGNOSIS — E11621 Type 2 diabetes mellitus with foot ulcer: Secondary | ICD-10-CM | POA: Diagnosis not present

## 2021-11-01 DIAGNOSIS — L988 Other specified disorders of the skin and subcutaneous tissue: Secondary | ICD-10-CM | POA: Diagnosis not present

## 2021-11-01 DIAGNOSIS — E11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: Secondary | ICD-10-CM | POA: Diagnosis not present

## 2021-11-01 DIAGNOSIS — L97512 Non-pressure chronic ulcer of other part of right foot with fat layer exposed: Secondary | ICD-10-CM | POA: Diagnosis not present

## 2021-11-01 NOTE — Progress Notes (Signed)
[name], [name] ([id number]) Visit Report for 11/01/2021 Arrival Information Details Patient Name: Date of Service: [name] 11/01/2021 8:30 A M Medical Record Number: [id number] Patient Account Number: [id number] Date of Birth/Sex: Treating RN: [birth year] (81 y.o. [name] Primary Care Shealynn Saulnier: [name] Other Clinician: Referring Coalton Arch: Treating Jalayla Chrismer/[name]: Tressie Stalker in Treatment: 44 Visit Information History Since Last Visit Added or deleted any medications: No Patient Arrived: [name] Any new allergies or adverse reactions: No Arrival Time: 08:27 Had a fall or experienced change in No Accompanied By: alone activities of daily living that may affect Transfer Assistance: None risk of falls: Patient Identification Verified: Yes Signs or symptoms of abuse/neglect since last visito No Secondary Verification Process Completed: Yes Hospitalized since last visit: No Patient Requires Transmission-Based Precautions: No Implantable device outside of the clinic excluding No Patient Has Alerts: Yes cellular tissue based products placed in the center Patient Alerts: Patient on Blood Thinner since last visit: L ABI: 0.82 Has Dressing in Place as Prescribed: Yes R ABI: 0.47 Pain Present Now: No Electronic Signature(s) Signed: 11/01/2021 6:23:21 PM By: [name] RN, BSN Entered By: [name] on 11/01/2021 08:28:26 -------------------------------------------------------------------------------- Clinic Level of Care Assessment Details Patient Name: Date of Service: [name], [name] 11/01/2021 8:30 A M Medical Record Number: [id number] Patient Account Number: [id number] Date of Birth/Sex: Treating RN: [birth year] (81 y.o. [name] Primary Care Adaley Kiene: [name] Other Clinician: Referring Darivs Lunden: Treating Hannalee Castor/[name]: Tressie Stalker in Treatment: 19 Clinic Level  of Care Assessment Items TOOL 4 Quantity Score X- 1 0 Use when only an EandM is performed on FOLLOW-UP visit ASSESSMENTS - Nursing Assessment / Reassessment X- 1 10 Reassessment of Co-morbidities (includes updates in patient status) X- 1 5 Reassessment of Adherence to Treatment Plan ASSESSMENTS - Wound and Skin A ssessment / Reassessment _0  - 0 Simple Wound Assessment / Reassessment - one wound X- 2 5 Complex Wound Assessment / Reassessment - multiple wounds _1  - 0 Dermatologic / Skin Assessment (not related to wound area) ASSESSMENTS - Focused Assessment _2  - 0 Circumferential Edema Measurements - multi extremities _3  - 0 Nutritional Assessment / Counseling / Intervention X- 1 5 Lower Extremity Assessment (monofilament, tuning fork, pulses) _4  - 0 Peripheral Arterial Disease Assessment (using hand held doppler) ASSESSMENTS - Ostomy and/or Continence Assessment and Care _5  - 0 Incontinence Assessment and Management _6  - 0 Ostomy Care Assessment and Management (repouching, etc.) PROCESS - Coordination of Care X - Simple Patient / Family Education for ongoing care 1 15 _7  - 0 Complex (extensive) Patient / Family Education for ongoing care X- 1 10 Staff obtains Programmer, systems, Records, T Results / Process Orders est X- 1 10 Staff telephones HHA, Nursing Homes / Clarify orders / etc _8  - 0 Routine Transfer to another Facility (non-emergent condition) _9  - 0 Routine Hospital Admission (non-emergent condition) _10  - 0 New Admissions / Biomedical engineer / Ordering NPWT Apligraf, etc. , _11  - 0 Emergency Hospital Admission (emergent condition) X- 1 10 Simple Discharge Coordination _12  - 0 Complex (extensive) Discharge Coordination PROCESS - Special Needs _13  - 0 Pediatric / Minor Patient Management _14  - 0 Isolation Patient Management _15  - 0 Hearing / Language / Visual special needs _16  - 0 Assessment of Community assistance (transportation, D/C planning, etc.) _17  -  0 Additional assistance / Altered mentation _18  - 0 Support Surface(s) Assessment (bed, cushion, seat, etc.) INTERVENTIONS - Wound Cleansing / Measurement _19  - 0 Simple  Wound Cleansing - one wound X- 2 5 Complex Wound Cleansing - multiple wounds X- 1 5 Wound Imaging (photographs - any number of wounds) _0  - 0 Wound Tracing (instead of photographs) _1  - 0 Simple Wound Measurement - one wound X- 2 5 Complex Wound Measurement - multiple wounds INTERVENTIONS - Wound Dressings _2  - 0 Small Wound Dressing one or multiple wounds X- 1 15 Medium Wound Dressing one or multiple wounds _3  - 0 Large Wound Dressing one or multiple wounds <UEAVWUJWJXBJYNWG>_9<\/FAOZHYQMVHQIONGE>_9  - 0 Application of Medications - topical <BMWUXLKGMWNUUVOZ>_3<\/GUYQIHKVQQVZDGLO>_7  - 0 Application of Medications - injection INTERVENTIONS - Miscellaneous _6  - 0 External ear exam _7  - 0 Specimen Collection (cultures, biopsies, blood, body fluids, etc.) _8  - 0 Specimen(s) / Culture(s) sent or taken to Lab for analysis _9  - 0 Patient Transfer (multiple staff / Civil Service fast streamer / Similar devices) _10  - 0 Simple Staple / Suture removal (25 or less) _11  - 0 Complex Staple / Suture removal (26 or more) _12  - 0 Hypo / Hyperglycemic Management (close monitor of Blood Glucose) _13  - 0 Ankle / Brachial Index (ABI) - do not check if billed separately X- 1 5 Vital Signs Has the patient been seen at the hospital within the last three years: Yes Total Score: 120 Level Of Care: New/Established - Level 4 Electronic Signature(s) Signed: 11/01/2021 6:23:21 PM By: [name] RN, BSN Entered By: [name] on 11/01/2021 08:59:26 -------------------------------------------------------------------------------- Encounter Discharge Information Details Patient Name: Date of Service: [name], [name] 11/01/2021 8:30 A M Medical Record Number: [id number] Patient Account Number: [id number] Date of Birth/Sex: Treating RN: Feb 25, 1941 (81 y.o. [name] Primary Care Laura Radilla: [name] Other  Clinician: Referring Auren Valdes: Treating Emelio Schneller/[name]: Tressie Stalker in Treatment: 19 Encounter Discharge Information Items Discharge Condition: Stable Ambulatory Status: Cane Discharge Destination: Home Transportation: Private Auto Accompanied By: alone Schedule Follow-up Appointment: Yes Clinical Summary of Care: Patient Declined Electronic Signature(s) Signed: 11/01/2021 6:23:21 PM By: [name] RN, BSN Entered By: [name] on 11/01/2021 08:59:59 -------------------------------------------------------------------------------- Lower Extremity Assessment Details Patient Name: Date of Service: [name], [name] 11/01/2021 8:30 A M Medical Record Number: [id number] Patient Account Number: [id number] Date of Birth/Sex: Treating RN: Jul 08, 1941 (81 y.o. [name] Primary Care Keithen Capo: [name] Other Clinician: Referring Haile Bosler: Treating Alainna Stawicki/[name]: Tressie Stalker in Treatment: 19 Edema Assessment Assessed: Shirlyn Goltz: No] Patrice Paradise: No] Edema: [Left: No] [Right: Yes] Calf Left: Right: Point of Measurement: From Medial Instep 31.5 cm 31 cm Ankle Left: Right: Point of Measurement: From Medial Instep 20.3 cm 20 cm Vascular Assessment Pulses: Dorsalis Pedis Palpable: [Right:Yes] Electronic Signature(s) Signed: 11/01/2021 6:23:21 PM By: [name] RN, BSN Entered By: [name] on 11/01/2021 08:44:03 -------------------------------------------------------------------------------- Multi Wound Chart Details Patient Name: Date of Service: [name], [place]. 11/01/2021 8:30 A M Medical Record Number: [id number] Patient Account Number: [id number] Date of Birth/Sex: Treating RN: 1941/03/04 (81 y.o. [name] Primary Care Sire Poet: [name] Other Clinician: Referring Peace Jost: Treating Evelene Roussin/[name]: Tressie Stalker in Treatment: 19 Vital  Signs Height(in): 64 Capillary Blood Glucose(mg/dl): 117 Weight(lbs): 135 Pulse(bpm): 59 Body Mass Index(BMI): 23.2 Blood Pressure(mmHg): 174/74 Temperature(F): 98.0 Respiratory Rate(breaths/min): 16 Photos: Right Amputation Site - Right, Lateral Foot Right, Medial Lower Leg Wound Location: Transmetatarsal Other Lesion Not Known Gradually Appeared Wounding Event: Diabetic Wound/Ulcer of the Lower Abrasion Diabetic Wound/Ulcer of the Lower Primary Etiology: Extremity Extremity Coronary Artery Disease, Deep Vein Coronary Artery Disease, Deep Vein Coronary Artery  Disease, Deep Vein Comorbid History: Thrombosis, Hypertension, Myocardial Thrombosis, Hypertension, Myocardial Thrombosis, Hypertension, Myocardial Infarction, Peripheral Arterial Disease, Infarction, Peripheral Arterial Disease, Infarction, Peripheral Arterial Disease, Type II Diabetes Type II Diabetes Type II Diabetes 09/15/2021 10/10/2021 10/22/2021 Date Acquired: _0 Weeks of Treatment: Open Open Open Wound Status: No No No Wound Recurrence: 0.3x1.1x0.3 0.2x0.3x0.1 0x0x0 Measurements L x W x D (cm) 0.259 0.047 0 A (cm) : rea 0.078 0.005 0 Volume (cm) : 34.10% 70.10% 100.00% % Reduction in Area: 33.90% 68.80% 100.00% % Reduction in Volume: Grade 3 Full Thickness Without Exposed Grade 2 Classification: Support Structures Medium Medium None Present Exudate Amount: Serosanguineous Serosanguineous N/A Exudate Type: red, brown red, brown N/A Exudate Color: Distinct, outline attached Flat and Intact Flat and Intact Wound Margin: Small (1-33%) Large (67-100%) None Present (0%) Granulation Amount: Pale Pink N/A Granulation Quality: Large (67-100%) None Present (0%) None Present (0%) Necrotic Amount: Fat Layer (Subcutaneous Tissue): Yes Fat Layer (Subcutaneous Tissue): Yes Fascia: No Exposed Structures: Fascia: No Fascia: No Fat Layer (Subcutaneous Tissue): No Tendon: No Tendon: No Tendon:  No Muscle: No Muscle: No Muscle: No Joint: No Joint: No Joint: No Bone: No Bone: No Bone: No Small (1-33%) Medium (34-66%) Large (67-100%) Epithelialization: Treatment Notes Electronic Signature(s) Signed: 11/01/2021 4:37:10 PM By: [name] MD Signed: 11/01/2021 6:23:21 PM By: [name] RN, BSN Entered By: [name] on 11/01/2021 09:25:38 -------------------------------------------------------------------------------- Multi-Disciplinary Care Plan Details Patient Name: Date of Service: [name], [name] 11/01/2021 8:30 A M Medical Record Number: [id number] Patient Account Number: [id number] Date of Birth/Sex: Treating RN: November 21, 1940 (81 y.o. [name] Primary Care Jazlene Bares: [name] Other Clinician: Referring Lavanna Rog: Treating Archit Leger/[name]: Tressie Stalker in Treatment: [place] reviewed with physician Active Inactive Nutrition Nursing Diagnoses: Impaired glucose control: actual or potential Potential for alteratiion in Nutrition/Potential for imbalanced nutrition Goals: Patient/caregiver agrees to and verbalizes understanding of need to use nutritional supplements and/or vitamins as prescribed Date Initiated: 06/15/2021 Date Inactivated: 09/20/2021 Target Resolution Date: 09/23/2021 Goal Status: Met Patient/caregiver will maintain therapeutic glucose control Date Initiated: 06/15/2021 Target Resolution Date: 11/17/2021 Goal Status: Active Interventions: Assess HgA1c results as ordered upon admission and as needed Assess patient nutrition upon admission and as needed per policy Provide education on elevated blood sugars and impact on wound healing Provide education on nutrition Treatment Activities: Education provided on Nutrition : 06/15/2021 Notes: 09/20/21: Glucose control ongoing. Wound/Skin Impairment Nursing Diagnoses: Impaired tissue integrity Knowledge deficit related to  ulceration/compromised skin integrity Goals: Patient/caregiver will verbalize understanding of skin care regimen Date Initiated: 06/15/2021 Target Resolution Date: 11/17/2021 Goal Status: Active Interventions: Assess patient/caregiver ability to obtain necessary supplies Assess patient/caregiver ability to perform ulcer/skin care regimen upon admission and as needed Assess ulceration(s) every visit Provide education on ulcer and skin care Notes: 09/20/21: Wound care regimen continues Electronic Signature(s) Signed: 11/01/2021 6:23:21 PM By: [name] RN, BSN Entered By: [name] on 11/01/2021 08:45:33 -------------------------------------------------------------------------------- Pain Assessment Details Patient Name: Date of Service: [name], [place]. 11/01/2021 8:30 A M Medical Record Number: [id number] Patient Account Number: [id number] Date of Birth/Sex: Treating RN: [birth year] (81 y.o. [name] Primary Care Cartel Mauss: [name] Other Clinician: Referring Ekta Dancer: Treating Keliah Harned/[name]: Tressie Stalker in Treatment: 19 Active Problems Location of Pain Severity and Description of Pain Patient Has Paino No Site Locations Pain Management and Medication Current Pain Management: Electronic Signature(s) Signed: 11/01/2021 6:23:21 PM By: [name] RN, BSN Entered By: Donnal Debar,  [name] on 11/01/2021 08:30:20 -------------------------------------------------------------------------------- Patient/Caregiver Education Details Patient Name: Date of Service: [name] 2/8/2023andnbsp8:30 A M Medical Record Number: [id number] Patient Account Number: [id number] Date of Birth/Gender: Treating RN: 03/29/1941 (81 y.o. [name] Primary Care Physician: [name] Other Clinician: Referring Physician: Treating Physician/[name]: Tressie Stalker in Treatment: 19 Education  Assessment Education Provided To: Patient Education Topics Provided Wound/Skin Impairment: Methods: Explain/Verbal Responses: State content correctly [employer]) Signed: 11/01/2021 6:23:21 PM By: [name] RN, BSN Entered By: [name] on 11/01/2021 08:45:47 -------------------------------------------------------------------------------- Wound Assessment Details Patient Name: Date of Service: [name], [name] 11/01/2021 8:30 A M Medical Record Number: [id number] Patient Account Number: [id number] Date of Birth/Sex: Treating RN: 17-Aug-1941 (81 y.o. 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 Wound Open Wounding Event: Other Lesion Status: Date Acquired: 09/15/2021 Comorbid Coronary Artery Disease, Deep Vein Thrombosis, Hypertension, Weeks Of Treatment: 6 History: Myocardial Infarction, Peripheral Arterial Disease, Type II Diabetes Clustered Wound: No Photos Wound Measurements Length: (cm) 0.3 Width: (cm) 1.1 Depth: (cm) 0.3 Area: (cm) 0.259 Volume: (cm) 0.078 % Reduction in Area: 34.1% % Reduction in Volume: 33.9% Epithelialization: Small (1-33%) Tunneling: No Undermining: No Wound Description Classification: Grade 3 Wound Margin: Distinct, outline attached Exudate Amount: Medium Exudate Type: Serosanguineous Exudate Color: red, brown Foul Odor After Cleansing: No Slough/Fibrino Yes Wound Bed Granulation Amount: Small (1-33%) Exposed Structure Granulation Quality: Pale Fascia Exposed: No Necrotic Amount: Large (67-100%) Fat Layer (Subcutaneous Tissue) Exposed: Yes Necrotic Quality: Adherent Slough Tendon Exposed: No Muscle Exposed: No Joint Exposed:  No Bone Exposed: No Treatment Notes 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 Wound Cleanser Discharge Instruction: Cleanse the wound with wound cleanser prior to applying a clean dressing using gauze sponges, not tissue or cotton balls. Peri-Wound Care Topical Primary Dressing Promogran Prisma Matrix, 4.34 (sq in) (silver collagen) Discharge Instruction: Moisten collagen with [place] Jelly Secondary Dressing Woven Gauze Sponges 2x2 in Discharge Instruction: Apply over primary dressing as directed. Secured With [employer], 4.5x3.1 (in/yd) Discharge Instruction: Secure with Kerlix as directed. Transpore Surgical Tape, 2x10 (in/yd) Discharge Instruction: Secure dressing with tape as directed. Compression Wrap Compression Stockings Add-Ons Electronic Signature(s) Signed: 11/01/2021 6:23:21 PM By: [name] RN, BSN Entered By: [name] on 11/01/2021 08:41:32 -------------------------------------------------------------------------------- Wound Assessment Details Patient Name: Date of Service: [name], [place]. 11/01/2021 8:30 A M Medical Record Number: [id number] Patient Account Number: [id number] Date of Birth/Sex: Treating RN: 1941/08/18 (81 y.o. [name] Primary Care Odalys Win: [name] Other Clinician: Referring Emera Bussie: Treating Tien Spooner/[name]: Tressie Stalker in Treatment: 19 Wound Status Wound Number: 17 Primary Abrasion Etiology: Wound Location: Right, Lateral Foot Wound Open Wounding Event: Not Known Status: Date Acquired: 10/10/2021 Comorbid Coronary Artery Disease, Deep Vein Thrombosis, Hypertension, Weeks Of Treatment: 3 History: Myocardial Infarction, Peripheral Arterial Disease, Type II Diabetes Clustered Wound: No Photos Wound  Measurements Length: (cm) 0.2 Width: (cm) 0.3 Depth: (cm) 0.1 Area: (cm) 0.047 Volume: (cm) 0.005 % Reduction in Area: 70.1% % Reduction in Volume: 68.8% Epithelialization: Medium (34-66%) Tunneling: No Undermining: No Wound Description Classification: Full Thickness Without Exposed Support Structures Wound Margin: Flat  and Intact Exudate Amount: Medium Exudate Type: Serosanguineous Exudate Color: red, brown Foul Odor After Cleansing: No Slough/Fibrino No Wound Bed Granulation Amount: Large (67-100%) Exposed Structure Granulation Quality: Pink Fascia Exposed: No Necrotic Amount: None Present (0%) Fat Layer (Subcutaneous Tissue) Exposed: Yes Tendon Exposed: No Muscle Exposed: No Joint Exposed: No Bone Exposed: No Treatment Notes Wound #17 (Foot) Wound Laterality: Right, Lateral Cleanser Soap and Water Discharge Instruction: May shower and wash wound with dial antibacterial soap and water prior to dressing change. Wound Cleanser Discharge Instruction: Cleanse the wound with wound cleanser prior to applying a clean dressing using gauze sponges, not tissue or cotton balls. Peri-Wound Care Topical Primary Dressing Promogran Prisma Matrix, 4.34 (sq in) (silver collagen) Discharge Instruction: Moisten collagen with [place] Jelly Secondary Dressing Woven Gauze Sponges 2x2 in Discharge Instruction: Apply over primary dressing as directed. Secured With [employer], 4.5x3.1 (in/yd) Discharge Instruction: Secure with Kerlix as directed. Transpore Surgical Tape, 2x10 (in/yd) Discharge Instruction: Secure dressing with tape as directed. Compression Wrap Compression Stockings Add-Ons Electronic Signature(s) Signed: 11/01/2021 6:23:21 PM By: [name] RN, BSN Entered By: [name] on 11/01/2021 08:41:58 -------------------------------------------------------------------------------- Wound Assessment Details Patient Name: Date of Service: [name], [name]  11/01/2021 8:30 A M Medical Record Number: [id number] Patient Account Number: [id number] Date of Birth/Sex: Treating RN: November 19, 1940 (81 y.o. [name] Primary Care French Kendra: [name] Other Clinician: Referring [name] [name]: Treating Tondalaya Perren/[name]: Tressie Stalker in Treatment: 19 Wound Status Wound Number: 18 Primary Diabetic Wound/Ulcer of the Lower Extremity Etiology: Wound Location: Right, Medial Lower Leg Wound Open Wounding Event: Gradually Appeared Status: Date Acquired: 10/22/2021 Comorbid Coronary Artery Disease, Deep Vein Thrombosis, Hypertension, Weeks Of Treatment: 1 History: Myocardial Infarction, Peripheral Arterial Disease, Type II Diabetes Clustered Wound: No Photos Wound Measurements Length: (cm) Width: (cm) Depth: (cm) Area: (cm) Volume: (cm) 0 % Reduction in Area: 100% 0 % Reduction in Volume: 100% 0 Epithelialization: Large (67-100%) 0 Tunneling: No 0 Undermining: No Wound Description Classification: Grade 2 Wound Margin: Flat and Intact Exudate Amount: None Present Foul Odor After Cleansing: No Slough/Fibrino No Wound Bed Granulation Amount: None Present (0%) Exposed Structure Necrotic Amount: None Present (0%) Fascia Exposed: No Fat Layer (Subcutaneous Tissue) Exposed: No Tendon Exposed: No Muscle Exposed: No Joint Exposed: No Bone Exposed: No Electronic Signature(s) Signed: 11/01/2021 6:23:21 PM By: [name] RN, BSN Entered By: [name] on 11/01/2021 08:42:27 -------------------------------------------------------------------------------- Vitals Details Patient Name: Date of Service: [name], [name] 11/01/2021 8:30 A M Medical Record Number: [id number] Patient Account Number: [id number] Date of Birth/Sex: Treating RN: 18-May-1941 (81 y.o. [name] Primary Care Rhen Dossantos: [name] Other Clinician: Referring Kaedence Connelly: Treating Charice Zuno/[name]: Tressie Stalker in Treatment: 19 Vital Signs Time Taken: 08:28 Temperature (F): 98.0 Height (in): 64 Pulse (bpm): 88 Weight (lbs): 135 Respiratory Rate (breaths/min): 16 Body Mass Index (BMI): 23.2 Blood Pressure (mmHg): 174/74 Capillary Blood Glucose (mg/dl): 117 Reference Range: 80 - 120 mg / dl Notes glucose per pt report Electronic Signature(s) Signed: 11/01/2021 6:23:21 PM By: [name] RN, BSN Entered By: [name] on 11/01/2021 08:30:13

## 2021-11-01 NOTE — Progress Notes (Addendum)
[name], [name] ([id number]) Visit Report for 11/01/2021 HPI Details Patient Name: Date of Service: [name] 11/01/2021 8:30 A M Medical Record Number: [id number] Patient Account Number: [id number] Date of Birth/Sex: Treating RN: 12/04/1940 (81 y.o. [name] Primary Care Provider: [name] Other Clinician: Referring Provider: Treating Provider/[name]: Tressie Stalker in Treatment: 1 History of Present Illness HPI Description: ADMISSION 07/13/2019 Patient is a 81 year old type II diabetic. She has known PAD. She has been followed by Dr. Jacqualyn Posey of podiatry for blistering areas on her toes dating back to 04/30/2019 which at this was the left fourth toe. By 8/11 she had wounds on the right and left second toes. She underwent arterial studies by Dr. Alvester Chou on 7/31 that showed ABIs on the right of 0.60 TBI of 0.26 on the left ABI of 0.56 and a TBI of 0.25. By 9/10 she had ischemic-looking wounds per Dr. Earleen Newport on the right first, left first second and third. She has been using Medihoney and then mupirocin more recently simply Betadine. The patient underwent angiography by Dr. Gwenlyn Found on 9/21. 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 Noted that there was 1 vessel runoff in the foot via the peroneal. On the left there was an 80% left SFA, 70% mid left SFA. There was a short segment calcified CTO distal less than SFA and above-knee popliteal artery reconstituting with two-vessel runoff. 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 It was felt that she had bilateral SFA disease as well as tibial vessel disease. An attempt was made to revascularize the left SFA but they were unable to cross because of the highly calcified nature of the lesion. 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 She has an area on the medial left fourth toe with raised horned callus on top of this. I am not certain what this represents. With regards to pain she has about a 2-1/2 I will claudication tolerance in the grocery store. She has some pain in night which is relieved by putting her feet down over the side of the bed. She is wearing Nitro-Dur patches on the top of her right foot. Past medical history includes type 2 diabetes with secondary PAD, neoplasm of the skin, diabetic retinopathy, carotid artery stenosis, hyperlipidemia hypertension. 11/2; the last time the patient was here I spoke to Dr. Gwenlyn Found about revascularization options on the right. As I understand things currently Dr. Gwenlyn Found spoke with Dr. Trula Slade and ultimately the patient was taken to surgery on 10/27. She had a right iliofemoral endarterectomy with a bovine patch angioplasty. I think the plan now is for her to have a staged intervention on the right SFA by Dr. Alvester Chou. Per the patient's understanding Dr. Gwenlyn Found and Dr. Jacqualyn Posey are waiting to see when the gangrenous toes on the right foot can be amputated. The patient states her pain is a lot better and she is grateful for that. 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 Small area on the left fourth toe. She is using Betadine to the mummified areas on the right and Medihoney on the left 08/27/2019; the last time I saw this patient I discharged her from the clinic. She had been revascularized by  Dr. Trula Slade and she had a right iliofemoral endarterectomy with a bovine angioplasty. 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 Jacqualyn Posey of podiatry on 08/07/2019. I note that she also had a intervention by Dr. Gwenlyn Found and he performed a directional atherectomy and drug-coated balloon angioplasty of the SFA and popliteal artery on the right. I am not  certain of the date of Dr. Kennon Holter procedure as of the time of this dictation. She was referred back to [place] by Dr. Earleen Newport predominantly for follow-up of the left fourth toe. She still has sutures and stitches in the right TMA site. She states her pain is a lot better. She expresses concern about the condition of the amputation site at the TMA. She is on doxycycline I think prescribed by Dr. Earleen Newport. She is complaining of some pain at night 12/10; I spoke to Dr. Jacqualyn Posey last week. He removed the sutures on the right foot on Monday of this week. She has the area on the left fourth toe just proximal to the PIP and then the right TMA site. She is still on doxycycline and has enough through next week. Unfortunately the TMA site does not look good at all. Both on the lateral and medial part of the incisions are areas that probe to bone. There is purulence over the medial part which I have cultured. We will use silver alginate. Left fourth toe looks somewhat better but there was still exposed bone 12/17; patient has an MRI booked for 12/30. Culture I did last week showed Pseudomonas Serratia and Enterococcus. This was purulent drainage coming out of the medial part of her amputation site. I use cefdinir 300 twice daily for 10 days that started on 12/15. Her x-ray on the right showed limited evaluation for osteomyelitis. The findings could have been postoperative. There was subtle erosion in the distal first and distal fifth metatarsal. An MRI was suggested. On the left she had irregularity of the fourth middle and proximal phalanx consistent with a history of osteomyelitis. I do not know that she has a history of osteomyelitis in this area. She had a newly defined area on the plantar third toe 12/31; patient's MRI is listed below: MRI OF THE RIGHT FOREFOOT WITHOUT AND WITH CONTRAST TECHNIQUE: Multiplanar, multisequence MR imaging of the right forefoot was performed before and after the administration of  intravenous contrast. CONTRAST 6 mL GADAVIST IV SOLN : COMPARISON: Plain films right foot 09/04/2019. FINDINGS: Bones/Joint/Cartilage The patient is status post transmetatarsal amputation as seen on the prior exam. Marrow edema and enhancement are seen in all of the distal metatarsals. In the first metatarsal, signal change extends 1.5 cm proximal to the stump and in the second metatarsal extends approximately 2 cm proximal to the stump. Edema and enhancement are seen in only the distal 0.7 cm of the third metatarsal stump and tips of the fourth and fifth metatarsals. Bone marrow signal is otherwise unremarkable. A small focus of subchondral edema is seen in the lateral talus, likely degenerative. Ligaments Intact. Muscles and Tendons No intramuscular fluid collection. Soft tissues Skin ulceration is seen off the stump of the first metatarsal. A thin fluid collection tracks deep to the wound and over the anterior metatarsals worrisome for small abscess. Intense subcutaneous edema and enhancement are seen diffusely. 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 Cellulitis about the foot. Skin ulceration  over the distal first metatarsal is identified with a thin fluid collection tracking anteriorly along the stump worrisome for abscess. Small focus of subchondral edema in the lateral talus is likely degenerative. Electronically Signed By: Inge Rise M.D. On: 09/23/2019 15:25 Patient arrives in clinic today not complaining any of any pain. 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 She does not describe claudication however her activity is very limited. 10/01/2019; since the patient was last here I have communicated with Dr. Gwenlyn Found after bypass by Dr. Trula Slade and  addressing the right superficial femoral artery he states that she is widely patent through peroneal arteries to the ankle with collaterals to the dorsalis pedis. He states he is going to talk to colleagues about the feasibility of tibial pedal access. The patient seems infectious disease later this afternoon Dr. Baxter Flattery. in preparation for this she has been off antibiotics for 1 week and I went ahead and obtain pieces of the remanence of her first metatarsal for pathology and CandS. The patient is a candidate for hyperbaric oxygen. 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 1/15; considerable improvement in the wounds on her feet. We are using silver collagen. She follows with Dr. Baxter Flattery of infectious disease and is on meropenem and daptomycin. She has been taught how to do this herself at home. Follows with Dr. Graylon Good at the end of treatment here. She has 2 wounds on the surgical TMA site 1 lateral and 1 medial the lateral 1 has regressed tremendously. The area medially still has some exposed bone although the base of this looks healthy. 1/22; 2 separate wounds on the surgical TMA site. Both of these look satisfactory. The medial area does not have any exposed bone. This is an improvement On the left side fourth toe dorsally over the proximal phalanx there is a deep punched out area that probes to bone. She has an area on the tip of the left third toe. She also tells [place] that in HBO she traumatized her left Achilles and this is left her with a superficial wound area 1/28; weekly visit along with HBO. She has 5 wounds. T our punched out areas on the original TMA site on the right. Both of these appear to have contracted. o The area on the right no longer has exposed bone. She has an area on the tip of the left third toe and on the DIP of the left fourth toe. Both of these had surface callus that I removed and unfortunately they have small areas that both go  to bone. She has a traumatic wound on the left Achilles area that happened in HBO and that appears better. She is tolerating her IV antibiotics well at home. She has home health changing the dressings and she is doing it once on the weekends. We have been using silver collagen. She has been extensively revascularized on the right by Dr. Trula Slade and subsequently by Dr. Gwenlyn Found. 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 Baxter Flattery of infectious disease on 2/17. Dr. Gwenlyn Found on 2/23 and according to her her antibiotics stopped on 2/23. She is tolerating hyperbarics well. She has made a tremendous improvement in the right forefoot with only 2 smaller open wounds. Using silver collagen. On the left  foot she has the area on the tip of the left third toe and the medial part of the left fourth toe. These had exposed bone last week I did not sense any of that today 2/12; sees Dr. Graylon Good on 2/17 and Dr. Gwenlyn Found on 2/23. We are going to use Dermagraft on this today however the lateral part of her train TMA incision on the right is healed and the medial part is down so much that we just continue with the silver collagen She has wounds on the tip of her left third and the medial aspect of the left fourth. Both of these still have exposed bone I have not been able to get these to epithelialize. 2/19; she sees Dr. Gwenlyn Found on 2/23 and I have communicated with him about the left vascular supply. Looking at her angiogram from 08/03/2019 it looks as though that they could not cross the left CFA. Noted that she had one-vessel runoff bilaterally. She also apparently saw Dr. Graylon Good although I did not look up these results for preparation of this record. 2/26; Dr. Gwenlyn Found is going to do an angiogram next week on Wednesday. I think they are going to try to go at this both retrograde and anterograde to see  if anything can be done to revascularize the left lower limb. She continues to make nice progress on the remaining wound on the right medial foot on her TMA site and the toe it wounds on the left are responding nicely as well 3/12; the patient underwent an extensive and complicated revascularization on the left leg by Dr. Fletcher Anon on 3/3; she had an atherectomy on occluded left popliteal artery; anterior tibial artery followed by a balloon angioplasty. Atherectomy was also performed to the left SFA because there was still significant 50% stenosis in the left popliteal artery they performed an intravascular lithotripsy which improved the residual stenosis to 20%. The same lithotripsy was used to dilate the proximal left SFA. She had a drug-eluting stent placed I believe in the SFA. The patient returned for hyperbarics this week. She has had some eye problems on the right which she tells me are secondary to diabetic retinopathy and she saw her eye doctor. She is really made excellent progress there is no open wounds on the left foot at all. She has one open area medially and her TMA site on the right lateral wound is closed. 3/19; the patient comes in today with the area on the medial transmet on the right improved. Some debris removed from the surface revealed the still open wound. Unfortunately she now has the open area on the third toe tip and the medial aspect of the dorsal fourth toe. These are in the same location as her previous wounds. These had actually closed up last week. 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 She is undergone complex revascularizations on the right by Dr. Trula Slade and Dr. Gwenlyn Found and I believe Dr. Fletcher Anon worked on the tibial vessels on the left most recently.she had a complex transmetatarsal amputation wound with underlying osteomyelitis on presentation to the clinic and then developed deep wounds on the plantar left third toe tip and  on the medial part of the left fourth toe at the PIP. She completed IVantibiotics as directed by infectious disease and I believe a course of oral antibiotics as well.she underwent a complete 40 treatments of hyperbarics. She comes in today with the vast majority of the right TMA site healed there is  a small opening on the most medial aspect. Her left third and left fourth toes appear to have epithelialization although this has fluctuated somewhat. 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 She had a gaping wound on the right transmetatarsal amputation L as well as deep wounds on the left third and left fourth toes. She underwent complex revascularizations on her bilateral lower legs which I described on her last visit. She completed IV antibiotics by infectious disease and 40 treatments of hyperbarics. Her left third and fourth toes are healed. The area on the right transmetatarsal amputation site is also closed today. READMISSION 08/04/2020 Mrs. [name] is now a 81 year old woman that we had for a complex stay in our clinic from October 2000 14 January 2020. 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 Her current problems with wounds began on October 30 when she developed a spontaneous opening over the right transmet medial aspect over the first metatarsal head. She has been using collagen on this that she had leftover from her last stay in this clinic. She also has had an open area on the medial right calf from a vein harvest site from her CABG she. She said that this is never really closed since the vein harvest was done. With regards to her arterial status this is a major issue. She had an angiogram by Dr. Gwenlyn Found in September 2020. She developed a gangrenous right first and second toes. Ultimately she was referred to Dr. Trula Slade who did a right  common femoral endarterectomy with patch angioplasty. This was on 07/21/2019 and she really had a good result. Dr. Gwenlyn Found did a atherectomy followed by a drug coated balloon angioplasty of her right SFA popliteal and tibial peroneal trunk on 08/03/2019 again with excellent excellent results. Follow-up.Dopplers in November revealed revealed a widely patent SFA and tibioperoneal trunk. UNFORTUNATELY her recent angiogram that was done on 08/01/2020 showed an 80% right common femoral artery stenosis just above the previous endarterectomy site occluded right right SFA at the origin reconstituting the adductor canal by the profunda femoris collaterals. The profunda femoris is also diffusely diseased. There is and again another 80% tibioperoneal trunk stenosis and one-vessel runoff via the peroneal artery. She is seen Dr. Donzetta Matters and she is scheduled for a femoral endarterectomy and right femoral-popliteal bypass grafting with endarterectomy of her tibial peroneal trunk. The scheduled surgery is on 11/23 The patient does not complain of pain at rest. She does have 5-minute walking claudication. The wounds on her right first met head remanent was spontaneous she does not think she hit this. As mentioned in the area on her right medial calf is a vein harvest site Dr. Gwenlyn Found had texted me about getting her in the clinic and we have arranged this as soon as we can. I have told her that we will have to see how successful Dr. Donzetta Matters is before we know what can be done about the wounds in a precise fashion. Fortunately there does not seem to be any obvious infection involving the right foot although that may need to be looked at if things really deteriorate. She was treated with IV antibiotics and hyperbaric oxygen for her previous osteomyelitis 12/2; the patient underwent a right femoral endarterectomy and bypass femoral to popliteal artery. This was done on 08/16/2020. She is done remarkably well. She has a  small area on  the right anterior tibia and a small area in the medial part of her original right transmetatarsal amputation. She appears to have had an excellent response to surgery. 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 [name] is a now 81 year old woman who arrives for review of wounds on the left TMA site extending into her plantar foot as well as an area on the left posterior calcaneus. 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 She was extensively revascularized on the right at that time as noted above in our previous notes ended up receiving IV antibiotics and hyperbaric oxygen therapy. Miraculously this TMA site actually closed and she really has had not any trouble with this since. UNFORTUNATELY the same cannot be said of her left side. Her problem apparently started sometime in July when she hit her left third toe on a barstool while playing pool. She developed an acute infection which eventually led to underlying osteomyelitis. She underwent an extensive hospitalization from 04/27/2021 through 05/15/2021. She received IV antibiotics. Also extensive intervention by Dr. Stanford Breed of vein and vascular which included a left common femoral to tibioperoneal trunk bypass as well as a left third toe amputation on 05/03/2021. Unfortunately the area did not heal she ended up requiring a left TMA. By her description this was left open. She has been using a wound VAC on this to earlier this week and then was switched to Santyl. I think because of the left forefoot off loader she is also developed an area on the left posterior calcaneus. She is not complaining of a lot of pain. She has her daughter at home to change the dressings. She has been prescribed Santyl and she has a tube that she is brought  into our clinic. I cannot get a lot of history of claudication although she is really not that mobile at this point walking with a walker. 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 She has had multiple interventions on the right side by vein and vascular and also by Dr. Gwenlyn Found as well as a transmetatarsal amputation on the right. She has had follow-up noninvasive studies on 06/06/2021; on the left that showed an ABI of 0.47 with monophasic waveforms a dorsalis pedis ABI of 0.40 with monophasic waveforms. Her ANGIOGRAM which was initially done by Dr. Oneida Alar on 04/28/2021 showed severe left foot superficial femoral artery occlusive disease and an occlusion of the left popliteal artery with two-vessel runoff to the left foot with moderate to severe disease below the knee popliteal artery and tibial disease. She had 80% distal right external iliac artery above the existing femoral above-knee popliteal bypass but she does not have an open wound on the right foot She arrives in clinic with an extensive open area on the TMA site which in the mid aspect extends into the plantar foot. She also has a more superficial area on the upper part of her Achilles area of the left heel 9/29; patient arrives in clinic today with slough over the TMA site. We use MolecuLight to look at the surface of this suggesting cyan and white discoloration over a large part of this wound also extending into  the third metatarsal area. She also has the area on the left posterior calcaneus. This is slough covered we will switch to [facility] here as well. There is some warmth in the area and some erythema we will give her antibiotics as well 10/6; completely necrotic surface once again on the left TMA site. We have been using Santyl and Hydrofera Blue. She has an area on the left posterior Achilles and  actually states this has been more painful this week indeed there is some erythema around this area. She also describes I think claudication at rest which is relieved by dipping her left leg over the side of the bed at night. 10/13; our intake nurse was able to brush some slough off the surface of the left TMA hence this did not require debridement that is better than last week. The area heading towards the plantar part of her foot required an aggressive debridement to clean this up. This is quite a sizable divot but does not go to bone. The area on the right Achilles heel is also covered in a high 100% nonviable tissue we have been using Santyl and silver alginate 10/20; the area on the left TMA looks much better. Even the area is spreading into the midfoot looks improved. The right Achilles still is covered and nonviable surface 100%. We have been using Santyl and silver alginate and this will continue for this week The patient is approved for 5 Apligraf's and I think she is good enough to start using these next week 10/27; the patient's wounds actually look quite good. We have been using Santyl and silver alginate. We applied Apligraf #1 today Saw Dr. Stanford Breed her vascular surgeon on 10/25 he told her TMA was healing nicely. She will follow-up with them in December to repeat noninvasive testing. 08/16/2021 upon evaluation today patient appears to be doing much better in regard to her wounds. Both are showing signs of improvement which is great news. I am going to perform some debridement to clear away some of the necrotic debris currently this will just be a light debridement. Nonetheless other than that I feel like that the Apligraf is doing quite well working and reapply today and this will be #3 as far as the application is concerned. 12/20; since the patient was last here she was seen by Dr. Stanford Breed who felt that her Doppler flow was sluggish in the bypass. She therefore went underwent a urgent  angiogram on 09/07/2021 by Dr. Carlis Abbott. She had an angioplasty of the distal anastomosis and the anterior tibial artery. She tolerated this well. She had no flow-limiting stenosis in the aortoiliac segment on the left. Some calcification in the left common iliac artery but it was not flow-limiting the common femoral and profunda were patent. The left lower extremity bypass had brisk flow with no significant proximal anastomosis on the common femoral artery. Distally there was a high-grade stenosis where the bypass anastomosis was performed. This was angioplastied as well as the anterior tibial artery. 12/28; the patient comes in early because of a wound over the tip of her right first metatarsal head. She is not really sure how this happened. Says she noticed it when she took her sock off on Friday before she got in the shower. She has been using Hydrofera Blue to this area. This is the original wounded site we dealt with in the setting of a previous right TMA. The patient has PA I researched this on [place] link. As it turns out  she has had recent arterial studies noninvasive on 09/05/2021. On the right she had dampened monophasic waveforms at the popliteal absent waveforms at the PTA and dorsalis pedis. An ABI could not be obtained previously at 0.47. The comment states that they were unable to detect Doppler waveforms of the posterior tibial and dorsalis pedis arteries. 2023 09/26/2021; her original left TMA wound is almost closed. I reapplied Apligraf to the remaining area. The area on the plantar foot is also closed as far as I can tell HOWEVER she comes in with a second wound on the right first metatarsal head remanent both of these probe to bone. I did a bone scraping of this last week which only showed Corynebacterium unlikely to be a true pathogen. Nevertheless I am going to put her on Augmentin today probably for 2 weeks. X-ray will be done today. She has an appointment with Dr. Gwenlyn Saran at vein  and vascular next Tuesday I will send him a note about my concerns about the right TM 1/10; we inadvertently remove the Apligraf from the left surgical site however everything looks closed here. She still has 2 probing wounds over the remanent of her first metatarsal head both go to bone although there is less erythema and swelling in the area. She does not have an appointment with Dr. Donzetta Matters until next Tuesday. I am going to go ahead and order an MRI of the right foot I had originally planned to have or at least consider HBO if the patient has underlying osteomyelitis in the right foot however she tells me that she lost a significant amount of her visual field during her last run with HBO in the right eye nasal aspect. She saw Dr. Zadie Rhine who is a retinal specialist I will see if I can look at his records. She is still on Augmentin as finishes on Monday. 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 She has been on Augmentin with some improvement in the surrounding erythema although the small wound on the tip and the plantar aspect still probe easily to bone and there does not appear to be any viable tissue. UNFORTUNATELY arrives in clinic today with new areas on the right lateral foot neither 1 of these look particularly healthy. No debridement. The patient has an appoint with Dr. Donzetta Matters this morning my question is angiography of the right lego. I also wonder about an infectious disease appointment and I will continue her Augmentin but refer her for consideration. I did not get a culture that was positive even from a bone scraping however. 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 She saw her retina doctor Dr. Deloria Lair and her interpretation of this was that the hyperbarics may have contributed to a blockage. I wonder whether this was a central retinal vein or artery occlusion. I am not sure that Dr. Zadie Rhine documents in epic I will need to see if I can find his note 1/25; this is [name] returns to clinic having a very eventful week. Firstly she underwent angiography by Dr. Stanford Breed of vein and vascular.. Her preintervention ABI was 0. She underwent a  right iliofemoral drug-coated balloon angioplasty, right popliteal drug-coated balloon angioplasty and a right tibial peroneal trunk and peroneal angioplasty by Dr. Stanford Breed. She tolerated this well. She definitely has better blood flow to the right foot. She saw Dr. Doren Custard in follow-up who felt her Doppler flow in the right foot was good. An x-ray did not show any evidence of osteomyelitis however I noted a previous MRI certainly did. She also saw infectious diseases been started on IV daptomycin and ertapenem. She had the PICC line placed. I had started her on Augmentin on February 3 and that had some improvement in the erythema and swelling over the remanent of her first metatarsal head. As far as I am aware she did not have a specific organism cultured at least not by me although I did try. Everything on the left is closed including the left transmit amputation site and the area on her left posterior heel/Achilles. We are using silver collagen on the right foot now which has 2 open wounds on the right first metatarsal head and the right fifth metatarsal head 2/1; the patient's left foot transmit site and the area on her heel remain closed. Some eschar but I do not think this requires debridement She has areas on the right first and right fifth met heads underlying osteomyelitis she is on IV daptomycin and ertapenem. The areas are small but probe to bone. I have debrided  with a #3 curette hemostasis with direct pressure I am able to get to some decent looking surrounding granulation tissue on the more worrisome areas on the right first metatarsal head 2/8; left foot transverse metatarsal amputation site which was the original wound we were dealing with this in the clinic remains closed looks very healthy even that he part that extended towards her plantar foot in the mid aspect of the incision. Her right transmetatarsal amputation site appears less erythematous and less swollen however the wound on the first met head probes to bone. The bone itself does not feel that vibrant. The wounds on the fifth metatarsal head also on the right have eschar on the surface I did not consider debriding this today. 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 Dr. 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 We will therefore pursue hyperbaric oxygen therapy and treatment of the underlying refractory osteomyelitis Electronic Signature(s) Signed: 11/01/2021 4:37:10 PM By: [name] MD Entered By: [name] on 11/01/2021 09:28:10 -------------------------------------------------------------------------------- Physical Exam Details Patient Name: Date of Service: [name], [place]. 11/01/2021 8:30 A M Medical Record Number: [id number] Patient Account Number: [id number] Date of Birth/Sex: Treating RN: [birth year] (81 y.o. [name] Primary Care Provider: [name] Other Clinician: Referring Provider: Treating Provider/[name]: Tressie Stalker in Treatment: 19 Cardiovascular Faint dorsalis pedis pulseHowever her foot is warm. Notes Wound exam; the area of  concern is now the right first metatarsal head remanent in the setting of a right transmetatarsal amputation also of the right fifth metatarsal head. On the first met head the wound probes easily to bone although I cannot see the bone to palpate this does not feel that vibrant. Nevertheless the swelling and erythema on this area is improved. On the fifth met head the wound here does not probe  to bone any longer however there are 3 areas that look threatened. Covered in eschared surfaces all of these are small no debridement was done on any of the areas Fortunately the area on the left transmet which is the original wound we were treating on this day in the clinic remains healthy epithelialized and healed Electronic Signature(s) Signed: 11/01/2021 4:37:10 PM By: [name] MD Entered By: [name] on 11/01/2021 09:30:00 -------------------------------------------------------------------------------- Physician Orders Details Patient Name: Date of Service: [name], [place]. 11/01/2021 8:30 A M Medical Record Number: [id number] Patient Account Number: [id number] Date of Birth/Sex: Treating RN: 1940/09/27 (81 y.o. [name] Primary Care Provider: [name] Other Clinician: Referring Provider: Treating Provider/[name]: Tressie Stalker in Treatment: 8 Verbal / Phone Orders: No Diagnosis Coding ICD-10 Coding Code Description E11.621 Type 2 diabetes mellitus with foot ulcer T81.31XD Disruption of external operation (surgical) wound, not elsewhere classified, subsequent encounter L97.514 Non-pressure chronic ulcer of other part of right foot with necrosis of bone L97.428 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 E11.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 -  with Dr. Dellia Nims Return A Bathing/ Shower/ Hygiene May shower with protection but do not get wound dressing(s) wet. Edema Control - Lymphedema / SCD / Other Elevate legs to the level of the heart or above for 30 minutes daily and/or when sitting, a frequency of: - throughout the day Avoid standing for long periods of time. Home Health No change in wound care orders this week; continue Home Health for wound care. May utilize formulary equivalent dressing for wound treatment orders unless otherwise specified. Dressing changes to be completed by [place] on Monday / Wednesday / Friday except when patient has scheduled visit at [facility]. 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 Follow Hyperbaric Oxygen Glycemia Protocol Afrin (Oxymetazoline HCL) 0.05% nasal spray - 1 spray in both nostrils daily as needed prior to HBO treatment for difficulty clearing ears Wound Treatment Wound #15 - Amputation Site - Transmetatarsal Wound Laterality: Right Cleanser: Soap and Water Every Other Day/30 Days Discharge Instructions: May shower and wash wound with dial antibacterial soap and water prior to dressing change. Cleanser: Wound Cleanser [facility]) Every Other Day/30 Days Discharge Instructions: Cleanse the wound with wound cleanser prior to applying a clean dressing using gauze sponges, not tissue or cotton balls. Prim Dressing: Promogran Prisma Matrix, 4.34 (sq in) (silver collagen) (Home Health) Every Other Day/30 Days ary Discharge Instructions: Moisten collagen with [place] Jelly Secondary Dressing: Woven Gauze Sponges 2x2 in [facility]) Every Other Day/30 Days Discharge Instructions: Apply over primary dressing as directed. Secured With: [employer], 4.5x3.1 (in/yd) [facility]) Every Other Day/30 Days Discharge Instructions: Secure with Kerlix as directed. Secured With: Transpore Surgical Tape, 2x10 (in/yd) [facility]) Every Other Day/30 Days Discharge Instructions: Secure dressing with tape as directed. Wound #17 - Foot Wound Laterality: Right, Lateral Cleanser: Soap and Water Every Other Day/30 Days Discharge Instructions: May shower and wash wound with dial antibacterial soap and water prior to dressing  change. Cleanser: Wound Cleanser [facility]) Every Other Day/30 Days Discharge Instructions: Cleanse the wound with wound cleanser prior to applying a clean dressing using gauze sponges, not tissue or cotton balls. Prim Dressing: Promogran Prisma Matrix, 4.34 (sq in) (silver collagen) (Home Health) Every Other Day/30 Days ary Discharge Instructions: Moisten collagen with [place] Jelly Secondary Dressing: Woven Gauze Sponges 2x2 in [facility]) Every Other Day/30 Days Discharge Instructions: Apply over primary dressing as directed. Secured With: [employer], 4.5x3.1 (in/yd) [facility]) Every Other Day/30 Days Discharge Instructions: Secure with Kerlix as directed. Secured With: Transpore Surgical Tape, 2x10 (in/yd) [facility]) Every Other Day/30 Days Discharge Instructions: Secure dressing with tape as directed. GLYCEMIA INTERVENTIONS PROTOCOL PRE-HBO GLYCEMIA INTERVENTIONS ACTION INTERVENTION Obtain pre-HBO capillary blood glucose (ensure 1 physician order is in chart). A. Notify HBO physician and await physician orders. 2 If result is 70 mg/dl or below: B. If the result meets the hospital definition of a critical result, follow hospital policy. A. Give patient an 8 ounce Glucerna Shake, an 8 ounce Ensure, or 8 ounces of a Glucerna/Ensure equivalent dietary supplement*. B. Wait  30 minutes. If result is 71 mg/dl to 130 mg/dl: C. Retest patients capillary blood glucose (CBG). D. If result greater than or equal to 110 mg/dl, proceed with HBO. If result less than 110 mg/dl, notify HBO physician and consider holding HBO. If result is 131 mg/dl to 249 mg/dl: A. Proceed with HBO. A. Notify HBO physician and await physician orders. B. It is recommended to hold HBO and do If result is 250 mg/dl or greater: blood/urine ketone testing. C. If the result meets the hospital definition of a critical result, follow hospital policy. POST-HBO GLYCEMIA INTERVENTIONS ACTION INTERVENTION Obtain post HBO capillary blood glucose (ensure 1 physician order is in chart). A. Notify HBO physician and await physician orders. 2 If result is 70 mg/dl or below: B. If the result meets the hospital definition of a critical result, follow hospital policy. A. Give patient an 8 ounce Glucerna Shake, an 8 ounce Ensure, or 8 ounces of a Glucerna/Ensure equivalent dietary supplement*. B. Wait 15 minutes for symptoms of If result is 71 mg/dl to 100 mg/dl: hypoglycemia (i.e. nervousness, anxiety, sweating, chills, clamminess, irritability, confusion, tachycardia or dizziness). C. If patient asymptomatic, discharge patient. If patient symptomatic, repeat capillary blood glucose (CBG) and notify HBO physician. If result is 101 mg/dl to 249 mg/dl: A. Discharge patient. A. Notify HBO physician and await physician orders. B. It is recommended to do blood/urine ketone If result is 250 mg/dl or greater: testing. C. If the result meets the hospital definition of a critical result, follow hospital policy. *Juice or candies are NOT equivalent products. If patient refuses the Glucerna or Ensure, please consult the hospital dietitian for an appropriate substitute. Electronic Signature(s) Signed: 11/02/2021 5:29:10 PM By: [name] MD Signed: 11/02/2021 6:05:40 PM By: [name] RN,  BSN Previous Signature: 11/01/2021 9:31:03 AM Version By: [name] MD Entered By: [name] on 11/02/2021 17:25:40 -------------------------------------------------------------------------------- Problem List Details Patient Name: Date of Service: [name], [place]. 11/01/2021 8:30 A M Medical Record Number: [id number] Patient Account Number: [id number] Date of Birth/Sex: Treating RN: [birth year] (81 y.o. [name] Primary Care Provider: [name] Other Clinician: Referring Provider: Treating Provider/[name]: Tressie Stalker in Treatment: 19 Active Problems ICD-10 Encounter Code Description Active Date MDM Diagnosis E11.621 Type 2 diabetes mellitus with foot ulcer 06/15/2021 No Yes T81.31XD Disruption of external operation (surgical) wound, not  elsewhere classified, 06/15/2021 No Yes subsequent encounter L97.514 Non-pressure chronic ulcer of other part of right foot with necrosis of bone 09/20/2021 No Yes E11.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 06/15/2021 No Yes 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, right ankle and foot 10/25/2021 No Yes Inactive Problems ICD-10 Code Description Active Date Inactive Date L97.528 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ity 06/15/2021 06/15/2021 L97.428 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 06/15/2021 06/15/2021 Resolved Problems Electronic Signature(s) Signed: 11/01/2021 4:37:10 PM By: [name] MD Entered By: [name] on 11/01/2021 09:34:04 -------------------------------------------------------------------------------- Progress Note Details Patient Name: Date of Service: [name], [name]. 11/01/2021 8:30 A M Medical Record Number: [id number] Patient Account Number: [id number] Date of Birth/Sex: Treating RN: [birth year] (81 y.o. [name] Primary Care  Provider: [name] Other Clinician: Referring Provider: Treating Provider/[name]: Tressie Stalker in Treatment: 19 Subjective History of Present Illness (HPI) ADMISSION 07/13/2019 Patient is a 81 year old type II diabetic. She has known PAD. She has been followed by Dr. Jacqualyn Posey of podiatry for blistering areas on her toes dating back to 04/30/2019 which at this was the left fourth toe. By 8/11 she had wounds on the right and left second toes. She underwent arterial studies by Dr. Alvester Chou on 7/31 that showed ABIs on the right of 0.60 TBI of 0.26 on the left ABI of 0.56 and a TBI of 0.25. By 9/10 she had ischemic-looking wounds per Dr. Earleen Newport on the right first, left first second and third. She has been using Medihoney and then mupirocin more recently simply Betadine. The patient underwent angiography by Dr. Gwenlyn Found on 9/21. 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 Noted that there was 1 vessel runoff in the foot via the peroneal. ooOn the left there was an 80% left SFA, 70% mid left SFA. There was a short segment calcified CTO distal less than SFA and above-knee popliteal artery reconstituting with two-vessel runoff. 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 It was felt that she had bilateral SFA disease as well as tibial vessel disease. An attempt was made to revascularize the left SFA but they were unable to cross because of the highly calcified nature of the lesion. 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 She has an area on the medial left fourth toe with raised horned callus on top of this. I am not certain what this represents. With regards to pain she has about a 2-1/2 I will claudication tolerance in the grocery  store. She has some pain in night which is relieved by putting her feet down over the side of the bed. She is wearing Nitro-Dur patches on the top of her right foot. Past medical history includes type 2 diabetes with secondary PAD, neoplasm of the skin, diabetic retinopathy, carotid artery stenosis, hyperlipidemia hypertension. 11/2; the last time the patient was here I spoke to Dr. Gwenlyn Found about revascularization options on the right. As I understand things currently Dr. Gwenlyn Found spoke with Dr. Trula Slade and ultimately the patient was taken to surgery on 10/27. She had a right iliofemoral endarterectomy with a bovine patch angioplasty. I think the plan now is for her to have a staged  intervention on the right SFA by Dr. Alvester Chou. Per the patient's understanding Dr. Gwenlyn Found and Dr. Jacqualyn Posey are waiting to see when the gangrenous toes on the right foot can be amputated. The patient states her pain is a lot better and she is grateful for that. 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 Small area on the left fourth toe. She is using Betadine to the mummified areas on the right and Medihoney on the left 08/27/2019; the last time I saw this patient I discharged her from the clinic. She had been revascularized by Dr. Trula Slade and she had a right iliofemoral endarterectomy with a bovine angioplasty. 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 Jacqualyn Posey of podiatry on 08/07/2019. I note that she also had a intervention by Dr. Gwenlyn Found and he performed a directional atherectomy and drug-coated balloon angioplasty of the SFA and popliteal artery on the right. I am not certain of the date of Dr. Kennon Holter procedure as of the time of this dictation. She was referred back to [place] by Dr. Earleen Newport predominantly for follow-up of the left fourth toe. She still has sutures and stitches in the right TMA site. She states her pain is a lot better. She expresses  concern about the condition of the amputation site at the TMA. She is on doxycycline I think prescribed by Dr. Earleen Newport. She is complaining of some pain at night 12/10; I spoke to Dr. Jacqualyn Posey last week. He removed the sutures on the right foot on Monday of this week. She has the area on the left fourth toe just proximal to the PIP and then the right TMA site. She is still on doxycycline and has enough through next week. Unfortunately the TMA site does not look good at all. Both on the lateral and medial part of the incisions are areas that probe to bone. There is purulence over the medial part which I have cultured. We will use silver alginate. Left fourth toe looks somewhat better but there was still exposed bone 12/17; patient has an MRI booked for 12/30. Culture I did last week showed Pseudomonas Serratia and Enterococcus. This was purulent drainage coming out of the medial part of her amputation site. I use cefdinir 300 twice daily for 10 days that started on 12/15. Her x-ray on the right showed limited evaluation for osteomyelitis. The findings could have been postoperative. There was subtle erosion in the distal first and distal fifth metatarsal. An MRI was suggested. On the left she had irregularity of the fourth middle and proximal phalanx consistent with a history of osteomyelitis. I do not know that she has a history of osteomyelitis in this area. She had a newly defined area on the plantar third toe 12/31; patient's MRI is listed below: MRI OF THE RIGHT FOREFOOT WITHOUT AND WITH CONTRAST TECHNIQUE: Multiplanar, multisequence MR imaging of the right forefoot was performed before and after the administration of intravenous contrast. CONTRAST 6 mL GADAVIST IV SOLN : COMPARISON: Plain films right foot 09/04/2019. FINDINGS: Bones/Joint/Cartilage The patient is status post transmetatarsal amputation as seen on the prior exam. Marrow edema and enhancement are seen in all of the distal  metatarsals. In the first metatarsal, signal change extends 1.5 cm proximal to the stump and in the second metatarsal extends approximately 2 cm proximal to the stump. Edema and enhancement are seen in only the distal 0.7 cm of the third metatarsal stump and tips of the  fourth and fifth metatarsals. Bone marrow signal is otherwise unremarkable. A small focus of subchondral edema is seen in the lateral talus, likely degenerative. Ligaments Intact. Muscles and Tendons No intramuscular fluid collection. Soft tissues Skin ulceration is seen off the stump of the first metatarsal. A thin fluid collection tracks deep to the wound and over the anterior metatarsals worrisome for small abscess. Intense subcutaneous edema and enhancement are seen diffusely. 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 Cellulitis about the foot. Skin ulceration over the distal first metatarsal is identified with a thin fluid collection tracking anteriorly along the stump worrisome for abscess. Small focus of subchondral edema in the lateral talus is likely degenerative. Electronically Signed By: Inge Rise M.D. On: 09/23/2019 15:25 Patient arrives in clinic today not complaining any of any pain. 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 She does not describe claudication however her activity is very limited. 10/01/2019; since the patient was last here I have communicated with Dr. Gwenlyn Found after bypass by Dr. Trula Slade and addressing the right superficial femoral artery he states that she is widely patent through peroneal arteries to the ankle with collaterals to the dorsalis pedis. He states he is going to talk to colleagues about the feasibility of tibial pedal access. The patient seems  infectious disease later this afternoon Dr. Baxter Flattery. in preparation for this she has been off antibiotics for 1 week and I went ahead and obtain pieces of the remanence of her first metatarsal for pathology and CandS. The patient is a candidate for hyperbaric oxygen. 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 1/15; considerable improvement in the wounds on her feet. We are using silver collagen. She follows with Dr. Baxter Flattery of infectious disease and is on meropenem and daptomycin. She has been taught how to do this herself at home. Follows with Dr. Graylon Good at the end of treatment here. She has 2 wounds on the surgical TMA site 1 lateral and 1 medial the lateral 1 has regressed tremendously. The area medially still has some exposed bone although the base of this looks healthy. 1/22; 2 separate wounds on the surgical TMA site. Both of these look satisfactory. The medial area does not have any exposed bone. This is an improvement On the left side fourth toe dorsally over the proximal phalanx there is a deep punched out area that probes to bone. She has an area on the tip of the left third toe. She also tells [place] that in HBO she traumatized her left Achilles and this is left her with a superficial wound area 1/28; weekly visit along with HBO. She has 5 wounds. T our punched out areas on the original TMA site on the right. Both of these appear to have contracted. o The area on the right no longer has exposed bone. She has an area on the tip of the left third toe and on the DIP of the left fourth toe. Both of these had surface callus that I removed and unfortunately they have small areas that both go to bone. She has a traumatic wound on the left Achilles area that happened in HBO and that appears better. She is tolerating her IV antibiotics well at home. She has home health changing the dressings and she is doing it once on the weekends.  We have been using silver  collagen. She has been extensively revascularized on the right by Dr. Trula Slade and subsequently by Dr. Gwenlyn Found. 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 Baxter Flattery of infectious disease on 2/17. Dr. Gwenlyn Found on 2/23 and according to her her antibiotics stopped on 2/23. She is tolerating hyperbarics well. She has made a tremendous improvement in the right forefoot with only 2 smaller open wounds. Using silver collagen. On the left foot she has the area on the tip of the left third toe and the medial part of the left fourth toe. These had exposed bone last week I did not sense any of that today 2/12; sees Dr. Graylon Good on 2/17 and Dr. Gwenlyn Found on 2/23. We are going to use Dermagraft on this today however the lateral part of her train TMA incision on the right is healed and the medial part is down so much that we just continue with the silver collagen She has wounds on the tip of her left third and the medial aspect of the left fourth. Both of these still have exposed bone I have not been able to get these to epithelialize. 2/19; she sees Dr. Gwenlyn Found on 2/23 and I have communicated with him about the left vascular supply. Looking at her angiogram from 08/03/2019 it looks as though that they could not cross the left CFA. Noted that she had one-vessel runoff bilaterally. She also apparently saw Dr. Graylon Good although I did not look up these results for preparation of this record. 2/26; Dr. Gwenlyn Found is going to do an angiogram next week on Wednesday. I think they are going to try to go at this both retrograde and anterograde to see if anything can be done to revascularize the left lower limb. She continues to make nice progress on the remaining wound on the right medial foot on her TMA site and the toe it wounds on the left are responding nicely as well 3/12; the patient underwent an extensive and  complicated revascularization on the left leg by Dr. Fletcher Anon on 3/3; she had an atherectomy on occluded left popliteal artery; anterior tibial artery followed by a balloon angioplasty. Atherectomy was also performed to the left SFA because there was still significant 50% stenosis in the left popliteal artery they performed an intravascular lithotripsy which improved the residual stenosis to 20%. The same lithotripsy was used to dilate the proximal left SFA. She had a drug-eluting stent placed I believe in the SFA. The patient returned for hyperbarics this week. She has had some eye problems on the right which she tells me are secondary to diabetic retinopathy and she saw her eye doctor. She is really made excellent progress there is no open wounds on the left foot at all. She has one open area medially and her TMA site on the right lateral wound is closed. 3/19; the patient comes in today with the area on the medial transmet on the right improved. Some debris removed from the surface revealed the still open wound. ooUnfortunately she now has the open area on the third toe tip and the medial aspect of the dorsal fourth toe. These are in the same location as her previous wounds. These had actually closed up last week. 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 She is undergone complex revascularizations on the  right by Dr. Trula Slade and Dr. Gwenlyn Found and I believe Dr. 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 She completed IVantibiotics as directed by infectious disease and I believe a course of oral antibiotics as well.she underwent a complete 40 treatments of hyperbarics. She comes in today with the vast majority of the  right TMA site healed there is a small opening on the most medial aspect. Her left third and left fourth toes appear to have epithelialization although this has fluctuated somewhat. 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 She had a gaping wound on the right transmetatarsal amputation L as well as deep wounds on the left third and left fourth toes. She underwent complex revascularizations on her bilateral lower legs which I described on her last visit. She completed IV antibiotics by infectious disease and 40 treatments of hyperbarics. Her left third and fourth toes are healed. The area on the right transmetatarsal amputation site is also closed today. READMISSION 08/04/2020 Mrs. [name] is now a 81 year old woman that we had for a complex stay in our clinic from October 2000 14 January 2020. 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 Her current problems with wounds began on October 30 when she developed a spontaneous opening over the right transmet medial aspect over the first metatarsal head. She has been using collagen on this that she had leftover from her last stay in this clinic. She also has had an open area on the medial right calf from a vein harvest site from her CABG she. She said that this is never really closed since the vein harvest was done. With regards to her arterial status this is a major issue. She had an angiogram by Dr. Gwenlyn Found in September 2020. She developed a gangrenous right first and second toes. Ultimately she was referred to Dr. Trula Slade who did a right common femoral endarterectomy with patch angioplasty. This was on 07/21/2019 and she really had a good result. Dr. Gwenlyn Found did a atherectomy followed by a drug coated balloon angioplasty of her right SFA popliteal and tibial peroneal trunk on 08/03/2019 again with excellent  excellent results. Follow-up.Dopplers in November revealed revealed a widely patent SFA and tibioperoneal trunk. UNFORTUNATELY her recent angiogram that was done on 08/01/2020 showed an 80% right common femoral artery stenosis just above the previous endarterectomy site occluded right right SFA at the origin reconstituting the adductor canal by the profunda femoris collaterals. The profunda femoris is also diffusely diseased. There is and again another 80% tibioperoneal trunk stenosis and one-vessel runoff via the peroneal artery. She is seen Dr. Donzetta Matters and she is scheduled for a femoral endarterectomy and right femoral-popliteal bypass grafting with endarterectomy of her tibial peroneal trunk. The scheduled surgery is on 11/23 The patient does not complain of pain at rest. She does have 5-minute walking claudication. The wounds on her right first met head remanent was spontaneous she does not think she hit this. As mentioned in the area on her right medial calf is a vein harvest site Dr. Gwenlyn Found had texted me about getting her in the clinic and we  have arranged this as soon as we can. I have told her that we will have to see how successful Dr. Donzetta Matters is before we know what can be done about the wounds in a precise fashion. Fortunately there does not seem to be any obvious infection involving the right foot although that may need to be looked at if things really deteriorate. She was treated with IV antibiotics and hyperbaric oxygen for her previous osteomyelitis 12/2; the patient underwent a right femoral endarterectomy and bypass femoral to popliteal artery. This was done on 08/16/2020. She is done remarkably well. She has a small area on the right anterior tibia and a small area in the medial part of her original right transmetatarsal amputation. She appears to have had an excellent response to surgery. 12/16; the patient's area on the right tibia and the small area on the medial part of her original right  transmetatarsal amputation is totally healed READMISSION 06/15/2021 Mrs. [name] is a now 81 year old woman who arrives for review of wounds on the left TMA site extending into her plantar foot as well as an area on the left posterior calcaneus. 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 She was extensively revascularized on the right at that time as noted above in our previous notes ended up receiving IV antibiotics and hyperbaric oxygen therapy. Miraculously this TMA site actually closed and she really has had not any trouble with this since. UNFORTUNATELY the same cannot be said of her left side. Her problem apparently started sometime in July when she hit her left third toe on a barstool while playing pool. She developed an acute infection which eventually led to underlying osteomyelitis. She underwent an extensive hospitalization from 04/27/2021 through 05/15/2021. She received IV antibiotics. Also extensive intervention by Dr. Stanford Breed of vein and vascular which included a left common femoral to tibioperoneal trunk bypass as well as a left third toe amputation on 05/03/2021. Unfortunately the area did not heal she ended up requiring a left TMA. By her description this was left open. She has been using a wound VAC on this to earlier this week and then was switched to Santyl. I think because of the left forefoot off loader she is also developed an area on the left posterior calcaneus. She is not complaining of a lot of pain. She has her daughter at home to change the dressings. She has been prescribed Santyl and she has a tube that she is brought into our clinic. I cannot get a lot of history of claudication although she is really not that mobile at this point walking with a walker. 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 She has had multiple interventions on the right side by vein and vascular and also by Dr. Gwenlyn Found as well as a transmetatarsal amputation on the right. She has had follow-up noninvasive studies on 06/06/2021; on the left that showed an ABI of 0.47 with monophasic waveforms a dorsalis pedis ABI of 0.40 with monophasic waveforms. Her ANGIOGRAM which was initially done by Dr. Oneida Alar on 04/28/2021 showed severe left foot superficial femoral artery occlusive disease and an occlusion of the left popliteal artery with two-vessel runoff to the left foot with moderate to severe disease below the knee popliteal artery and tibial disease. She had 80%  distal right external iliac artery above the existing femoral above-knee popliteal bypass but she does not have an open wound on the right foot She arrives in clinic with an extensive open area on the TMA site which in the mid aspect extends into the plantar foot. She also has a more superficial area on the upper part of her Achilles area of the left heel 9/29; patient arrives in clinic today with slough over the TMA site. We use MolecuLight to look at the surface of this suggesting cyan and white discoloration over a large part of this wound also extending into the third metatarsal area. She also has the area on the left posterior calcaneus. This is slough covered we will switch to [facility] here as well. There is some warmth in the area and some erythema we will give her antibiotics as well 10/6; completely necrotic surface once again on the left TMA site. We have been using Santyl and Hydrofera Blue. She has an area on the left posterior Achilles and actually states this has been more painful this week indeed there is some erythema around this area. She also describes I think claudication at rest which is relieved by dipping her left leg over the side of the bed at night. 10/13; our intake nurse was able to brush some  slough off the surface of the left TMA hence this did not require debridement that is better than last week. The area heading towards the plantar part of her foot required an aggressive debridement to clean this up. This is quite a sizable divot but does not go to bone. The area on the right Achilles heel is also covered in a high 100% nonviable tissue we have been using Santyl and silver alginate 10/20; the area on the left TMA looks much better. Even the area is spreading into the midfoot looks improved. The right Achilles still is covered and nonviable surface 100%. We have been using Santyl and silver alginate and this will continue for this week The patient is approved for 5 Apligraf's and I think she is good enough to start using these next week 10/27; the patient's wounds actually look quite good. We have been using Santyl and silver alginate. We applied Apligraf #1 today Saw Dr. Stanford Breed her vascular surgeon on 10/25 he told her TMA was healing nicely. She will follow-up with them in December to repeat noninvasive testing. 08/16/2021 upon evaluation today patient appears to be doing much better in regard to her wounds. Both are showing signs of improvement which is great news. I am going to perform some debridement to clear away some of the necrotic debris currently this will just be a light debridement. Nonetheless other than that I feel like that the Apligraf is doing quite well working and reapply today and this will be #3 as far as the application is concerned. 12/20; since the patient was last here she was seen by Dr. Stanford Breed who felt that her Doppler flow was sluggish in the bypass. She therefore went underwent a urgent angiogram on 09/07/2021 by Dr. Carlis Abbott. She had an angioplasty of the distal anastomosis and the anterior tibial artery. She tolerated this well. She had no flow-limiting stenosis in the aortoiliac segment on the left. Some calcification in the left common iliac artery but it  was not flow-limiting the common femoral and profunda were patent. The left lower extremity bypass had brisk flow with no significant proximal anastomosis on the common femoral artery. Distally there was a high-grade  stenosis where the bypass anastomosis was performed. This was angioplastied as well as the anterior tibial artery. 12/28; the patient comes in early because of a wound over the tip of her right first metatarsal head. She is not really sure how this happened. Says she noticed it when she took her sock off on Friday before she got in the shower. She has been using Hydrofera Blue to this area. This is the original wounded site we dealt with in the setting of a previous right TMA. The patient has PA I researched this on [employer] link. As it turns out she has had recent arterial studies noninvasive on 09/05/2021. On the right she had dampened monophasic waveforms at the popliteal absent waveforms at the PTA and dorsalis pedis. An ABI could not be obtained previously at 0.47. The comment states that they were unable to detect Doppler waveforms of the posterior tibial and dorsalis pedis arteries. 2023 09/26/2021; her original left TMA wound is almost closed. I reapplied Apligraf to the remaining area. The area on the plantar foot is also closed as far as I can tell HOWEVER she comes in with a second wound on the right first metatarsal head remanent both of these probe to bone. I did a bone scraping of this last week which only showed Corynebacterium unlikely to be a true pathogen. Nevertheless I am going to put her on Augmentin today probably for 2 weeks. X-ray will be done today. She has an appointment with Dr. Gwenlyn Saran at vein and vascular next Tuesday I will send him a note about my concerns about the right TM 1/10; we inadvertently remove the Apligraf from the left surgical site however everything looks closed here. She still has 2 probing wounds over the remanent of her first metatarsal head  both go to bone although there is less erythema and swelling in the area. She does not have an appointment with Dr. Donzetta Matters until next Tuesday. I am going to go ahead and order an MRI of the right foot I had originally planned to have or at least consider HBO if the patient has underlying osteomyelitis in the right foot however she tells me that she lost a significant amount of her visual field during her last run with HBO in the right eye nasal aspect. She saw Dr. Zadie Rhine who is a retinal specialist I will see if I can look at his records. She is still on Augmentin as finishes on Monday. 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 She has been on Augmentin with some improvement in the surrounding erythema although the small wound on the tip and the plantar aspect still probe easily to bone and there does not appear to be any viable tissue. UNFORTUNATELY arrives in clinic today with new areas on the right lateral foot neither 1 of these look particularly healthy. No debridement. The patient has an appoint with Dr. Donzetta Matters this morning my question is angiography of the right lego. I also wonder about an infectious disease appointment and I will continue her Augmentin but refer her for consideration. I did not get a culture that was positive even from a bone scraping however. After ID and vascular I will consider hyperbarics although she tells me that after her last round of hyperbarics which was 2 or 3 years ago where she had a bad wound  on the original right transmit amputation site she developed visual loss in the right eye. She saw her retina doctor Dr. Deloria Lair and her interpretation of this was that the hyperbarics may have contributed to a blockage. I wonder whether this was a central retinal vein or artery occlusion. I am not  sure that Dr. Zadie Rhine documents in epic I will need to see if I can find his note 1/25; this is [place] returns to clinic having a very eventful week. Firstly she underwent angiography by Dr. Stanford Breed of vein and vascular.. Her preintervention ABI was 0. She underwent a right iliofemoral drug-coated balloon angioplasty, right popliteal drug-coated balloon angioplasty and a right tibial peroneal trunk and peroneal angioplasty by Dr. Stanford Breed. She tolerated this well. She definitely has better blood flow to the right foot. She saw Dr. Doren Custard in follow-up who felt her Doppler flow in the right foot was good. An x-ray did not show any evidence of osteomyelitis however I noted a previous MRI certainly did. She also saw infectious diseases been started on IV daptomycin and ertapenem. She had the PICC line placed. I had started her on Augmentin on February 3 and that had some improvement in the erythema and swelling over the remanent of her first metatarsal head. As far as I am aware she did not have a specific organism cultured at least not by me although I did try. Everything on the left is closed including the left transmit amputation site and the area on her left posterior heel/Achilles. We are using silver collagen on the right foot now which has 2 open wounds on the right first metatarsal head and the right fifth metatarsal head 2/1; the patient's left foot transmit site and the area on her heel remain closed. Some eschar but I do not think this requires debridement She has areas on the right first and right fifth met heads underlying osteomyelitis she is on IV daptomycin and ertapenem. The areas are small but probe to bone. I have debrided with a #3 curette hemostasis with direct pressure I am able to get to some decent looking surrounding granulation tissue on the more worrisome areas on the right first metatarsal head 2/8; left foot transverse metatarsal amputation site which was the original wound we were  dealing with this in the clinic remains closed looks very healthy even that he part that extended towards her plantar foot in the mid aspect of the incision. Her right transmetatarsal amputation site appears less erythematous and less swollen however the wound on the first met head probes to bone. The bone itself does not feel that vibrant. The wounds on the fifth metatarsal head also on the right have eschar on the surface I did not consider debriding this today. 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 Dr. 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 We will therefore pursue hyperbaric oxygen therapy and treatment of the underlying refractory osteomyelitis Objective Constitutional Vitals Time Taken: 8:28 AM, Height: 64 in, Weight: 135 lbs, BMI: 23.2, Temperature: 98.0 F, Pulse: 88 bpm, Respiratory Rate: 16 breaths/min, Blood Pressure: 174/74 mmHg, Capillary Blood Glucose: 117 mg/dl. General Notes: glucose per pt report Cardiovascular Faint dorsalis pedis pulseHowever her foot is warm. General Notes: Wound exam; the area of  concern is now the right first metatarsal head remanent in the setting of a right transmetatarsal amputation also of the right fifth metatarsal head. On the first met head the wound probes easily to bone although I cannot see the bone to palpate this does not feel that vibrant. Nevertheless the swelling and erythema on this area is improved. On the fifth met head the wound here does not probe to bone any longer however there are 3 areas that look threatened. Covered in eschared surfaces all of these are small no debridement was done on any of the areas Fortunately the area on the left transmet which is  the original wound we were treating on this day in the clinic remains healthy epithelialized and healed Integumentary (Hair, Skin) Wound #15 status is Open. Original cause of wound was Other Lesion. The date acquired was: 09/15/2021. The wound has been in treatment 6 weeks. 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 The wound measures 0.3cm length x 1.1cm width x 0.3cm depth; 0.259cm^2 area and 0.078cm^3 volume. There is Fat Layer (Subcutaneous Tissue) exposed. There is no tunneling or undermining noted. There is a medium amount of serosanguineous drainage noted. The wound margin is distinct with the outline attached to the wound base. There is small (1-33%) pale granulation within the wound bed. There is a large (67-100%) amount of necrotic tissue within the wound bed including Adherent Slough. Wound #17 status is Open. Original cause of wound was Not Known. The date acquired was: 10/10/2021. The wound has been in treatment 3 weeks. The wound is located on the Right,Lateral Foot. The wound measures 0.2cm length x 0.3cm width x 0.1cm depth; 0.047cm^2 area and 0.005cm^3 volume. There is Fat Layer (Subcutaneous Tissue) exposed. There is no tunneling or undermining noted. There is a medium amount of serosanguineous drainage noted. The wound margin is flat and intact. There is large (67-100%) pink granulation within the wound bed. There is no necrotic tissue within the wound bed. Wound #18 status is Open. Original cause of wound was Gradually Appeared. The date acquired was: 10/22/2021. The wound has been in treatment 1 weeks. The wound is located on the Right,Medial Lower Leg. The wound measures 0cm length x 0cm width x 0cm depth; 0cm^2 area and 0cm^3 volume. There is no tunneling or undermining noted. There is a none present amount of drainage noted. The wound margin is flat and intact. There is no granulation within the wound bed. There is no necrotic tissue within the wound  bed. 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 Her most recent intervention was on 10/13/2021 on the right side this included drug-coated balloon angioplasty of the external iliac and common femoral arteries. Angioplasty of the popliteal artery the tibioperoneal trunk and the peroneal artery. At that point she was felt to be optimized in terms of the vascular supply in her right lower extremity. From the clinical point of view she has had a good response her foot is warm and peripheral pulses are palpable at the dorsalis pedis Nutritional management No clinical evidence of significant protein calorie malnutrition. Weight 135 pounds BMI 23.2 Glucose control Most recent hemoglobin A1c  7.6 on 09/29/2021 Debridement wound bed management Both wound areas on the right first and right fifth metatarsal head remanence probe to bone. No recent debridement has been performed in this area Offloading Compliant with the surgical shoe Control of infection The patient has been on antibiotics since January 10/10/21. Initially started on Augmentin empirically by our clinic. She was referred to infectious disease and was seen by Dr. Baxter Flattery. Started on IV meropenem and daptomycin which she remains on to this date MRI results MRI was performed on 10/07/2021 showing mild marrow edema in the first metatarsal stump concerning for osteomyelitis. No drainable fluid collection to suggest an abscess. 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 This happened in our clinic while we are working on a surgical wound on her left foot since healed. She is currently on IV antibiotics as directed by infectious disease. She has been revascularized. Her wounds probe to bone with the underlying bone in an unhealthy state at the bedside. In addition to IV antibiotics and aggressive wound care. Hyperbaric oxygen at 2 atm 30 treatments, no air breaks, have been ordered. This is a treatment depth that she has previously tolerated. Goal of therapy will be closure of the wounds on her right foot, aid in eradication of the underlying osteomyelitis specifically targeting angiogenesis. This is a limb threatening situation. She has had a previous TMA bilaterally. She is at extreme risk for limb loss Plan Follow-up Appointments: Return Appointment in 1 week. - with Dr. Arcola Jansky Shower/ Hygiene: May shower with protection but do not get wound dressing(s) wet. Edema Control - Lymphedema / SCD / Other: Elevate legs to the level of the heart or above for 30 minutes daily and/or when sitting, a frequency of: - throughout the day Avoid standing for long periods of time. Home Health: No change in wound care orders this week; continue Home Health for wound care. May utilize formulary equivalent dressing for wound treatment orders unless otherwise specified. Dressing changes to be completed by [place] on Monday / Wednesday / Friday except when patient has scheduled  visit at [facility]. Other Home Health Orders/Instructions: - [facility] Hyperbaric Oxygen Therapy: Evaluate for HBO Therapy Indication: - Wagner 3 diabetic ulcer of right foot 2.0 ATA for 90 Minutes without Air Breaks T Number of Treatments: - 30 otal One treatments per day (delivered Monday through Friday unless otherwise specified in Special Instructions below): Finger stick Blood Glucose Pre- and Post- HBOT Treatment. Follow Hyperbaric Oxygen Glycemia Protocol Afrin (Oxymetazoline HCL) 0.05% nasal spray - 1 spray in both nostrils daily as needed prior to HBO treatment for difficulty clearing ears WOUND #15: - Amputation Site - Transmetatarsal Wound Laterality: Right Cleanser: Soap and Water Every Other Day/30 Days Discharge Instructions: May shower and wash wound with dial antibacterial soap and water prior to dressing change. Cleanser: Wound Cleanser [facility]) Every Other Day/30 Days Discharge Instructions: Cleanse the wound with wound cleanser prior to applying a clean dressing using gauze sponges, not tissue or cotton balls. Prim Dressing: Promogran Prisma Matrix,  4.34 (sq in) (silver collagen) (Home Health) Every Other Day/30 Days ary Discharge Instructions: Moisten collagen with [place] Jelly Secondary Dressing: Woven Gauze Sponges 2x2 in [facility]) Every Other Day/30 Days Discharge Instructions: Apply over primary dressing as directed. Secured With: [employer], 4.5x3.1 (in/yd) [facility]) Every Other Day/30 Days Discharge Instructions: Secure with Kerlix as directed. Secured With: [employer] T ape, 2x10 (in/yd) (Home Health) Every Other Day/30 Days Discharge Instructions: Secure dressing with tape as directed. WOUND #17: - Foot Wound Laterality: Right, Lateral Cleanser: Soap and Water Every Other Day/30 Days Discharge Instructions: May shower and wash wound with dial antibacterial soap and water prior to dressing change. Cleanser: Wound Cleanser [facility]) Every Other Day/30 Days Discharge Instructions: Cleanse the wound with wound cleanser prior to applying a clean dressing using gauze sponges, not tissue or cotton balls. Prim Dressing: Promogran Prisma Matrix, 4.34 (sq in) (silver collagen) (Home Health) Every Other Day/30 Days ary Discharge Instructions: Moisten collagen with [place] Jelly Secondary Dressing: Woven Gauze Sponges 2x2 in [facility]) Every Other Day/30 Days Discharge Instructions: Apply over primary dressing as directed. Secured With: [employer], 4.5x3.1 (in/yd) [facility]) Every Other Day/30 Days Discharge Instructions: Secure with Kerlix as directed. Secured With: [employer] T ape, 2x10 (in/yd) (Home Health) Every Other Day/30 Days Discharge Instructions: Secure dressing with tape as directed. 1. Osteomyelitis of the right foot as described In the setting of type 2 diabetes with angiopathy [Wagner 3] 2. She is on daptomycin and ertapenem. This has been directed by infectious disease. Her last C-reactive protein was 4.3 on 10/12/2021 3. I would not like to initiate hyperbaric oxygen at 2 atm for 30 treatments no air breaks. Goal of therapy would be to aid in eradicating the osteomyelitis and allow healing of the wounds on her foot. 4. This is a limb threatening situation and the patient is aware of this. 5 the wound on the right foot occurred while the patient was being treated for a large wound at the recent left TMA site. This has since healed Electronic Signature(s) Signed: 11/03/2021 12:22:17 PM By: [name] MD Previous Signature: 11/02/2021 5:26:18 PM Version By: [name] MD Previous Signature: 11/01/2021 4:37:10 PM Version By: [name] MD Entered By: [name] on 11/03/2021 12:22:17 -------------------------------------------------------------------------------- SuperBill Details Patient Name: Date of Service: [name], [name] 11/01/2021 Medical Record Number:  [id number] Patient Account Number: [id number] Date of Birth/Sex: Treating RN: 1940/10/03 (81 y.o. 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

## 2021-11-03 DIAGNOSIS — I70202 Unspecified atherosclerosis of native arteries of extremities, left leg: Secondary | ICD-10-CM | POA: Diagnosis not present

## 2021-11-03 DIAGNOSIS — Z4781 Encounter for orthopedic aftercare following surgical amputation: Secondary | ICD-10-CM | POA: Diagnosis not present

## 2021-11-03 DIAGNOSIS — E11628 Type 2 diabetes mellitus with other skin complications: Secondary | ICD-10-CM | POA: Diagnosis not present

## 2021-11-03 DIAGNOSIS — I10 Essential (primary) hypertension: Secondary | ICD-10-CM | POA: Diagnosis not present

## 2021-11-03 DIAGNOSIS — L89620 Pressure ulcer of left heel, unstageable: Secondary | ICD-10-CM | POA: Diagnosis not present

## 2021-11-03 DIAGNOSIS — M869 Osteomyelitis, unspecified: Secondary | ICD-10-CM | POA: Diagnosis not present

## 2021-11-03 DIAGNOSIS — D638 Anemia in other chronic diseases classified elsewhere: Secondary | ICD-10-CM | POA: Diagnosis not present

## 2021-11-06 ENCOUNTER — Other Ambulatory Visit: Payer: Self-pay

## 2021-11-06 DIAGNOSIS — I739 Peripheral vascular disease, unspecified: Secondary | ICD-10-CM

## 2021-11-06 DIAGNOSIS — L89620 Pressure ulcer of left heel, unstageable: Secondary | ICD-10-CM | POA: Diagnosis not present

## 2021-11-06 DIAGNOSIS — Z4781 Encounter for orthopedic aftercare following surgical amputation: Secondary | ICD-10-CM | POA: Diagnosis not present

## 2021-11-06 DIAGNOSIS — I10 Essential (primary) hypertension: Secondary | ICD-10-CM | POA: Diagnosis not present

## 2021-11-06 DIAGNOSIS — I70202 Unspecified atherosclerosis of native arteries of extremities, left leg: Secondary | ICD-10-CM | POA: Diagnosis not present

## 2021-11-06 DIAGNOSIS — D638 Anemia in other chronic diseases classified elsewhere: Secondary | ICD-10-CM | POA: Diagnosis not present

## 2021-11-06 DIAGNOSIS — E11628 Type 2 diabetes mellitus with other skin complications: Secondary | ICD-10-CM | POA: Diagnosis not present

## 2021-11-08 ENCOUNTER — Other Ambulatory Visit: Payer: Self-pay

## 2021-11-08 ENCOUNTER — Encounter (HOSPITAL_BASED_OUTPATIENT_CLINIC_OR_DEPARTMENT_OTHER): Payer: Medicare Other | Admitting: Internal Medicine

## 2021-11-08 DIAGNOSIS — E11621 Type 2 diabetes mellitus with foot ulcer: Secondary | ICD-10-CM | POA: Diagnosis not present

## 2021-11-08 DIAGNOSIS — L97528 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ity: Secondary | ICD-10-CM | POA: Diagnosis not present

## 2021-11-08 DIAGNOSIS — L97512 Non-pressure chronic ulcer of other part of right foot with fat layer exposed: Secondary | ICD-10-CM | POA: Diagnosis not present

## 2021-11-08 DIAGNOSIS — L97428 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: Secondary | ICD-10-CM | POA: Diagnosis not present

## 2021-11-08 DIAGNOSIS — E11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: Secondary | ICD-10-CM | POA: Diagnosis not present

## 2021-11-08 DIAGNOSIS — L97514 Non-pressure chronic ulcer of other part of right foot with necrosis of bone: Secondary | ICD-10-CM | POA: Diagnosis not present

## 2021-11-08 DIAGNOSIS — M86671 Other chronic osteomyelitis, right ankle and foot: Secondary | ICD-10-CM | POA: Diagnosis not present

## 2021-11-08 LAB — GLUCOSE, CAPILLARY: Glucose-Capillary: 137 mg/dL — ABNORMAL HIGH (ref 70–99)

## 2021-11-08 NOTE — Progress Notes (Signed)
MALISA, RUGGIERO ([id number]) Visit Report for 11/08/2021 HPI Details Patient Name: Date of Service: [name] 11/08/2021 12:30 PM Medical Record Number: [id number] Patient Account Number: [id number] Date of Birth/Sex: Treating RN: [birth year] (81 y.o. [name] Primary Care Provider: [name] Other Clinician: Referring Provider: Treating Provider/[name]: Tressie Stalker in Treatment: 20 History of Present Illness HPI Description: ADMISSION 07/13/2019 Patient is a 81 year old type II diabetic. She has known PAD. She has been followed by Dr. Jacqualyn Posey of podiatry for blistering areas on her toes dating back to 04/30/2019 which at this was the left fourth toe. By 8/11 she had wounds on the right and left second toes. She underwent arterial studies by Dr. Alvester Chou on 7/31 that showed ABIs on the right of 0.60 TBI of 0.26 on the left ABI of 0.56 and a TBI of 0.25. By 9/10 she had ischemic-looking wounds per Dr. Earleen Newport on the right first, left first second and third. She has been using Medihoney and then mupirocin more recently simply Betadine. The patient underwent angiography by Dr. Gwenlyn Found on 9/21. 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 Noted that there was 1 vessel runoff in the foot via the peroneal. On the left there was an 80% left SFA, 70% mid left SFA. There was a short segment calcified CTO distal less than SFA and above-knee popliteal artery reconstituting with two-vessel runoff. 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 It was felt that she had bilateral SFA disease as well as tibial vessel disease. An attempt was made to revascularize the left SFA but they were unable to cross because of the highly calcified nature of the lesion. 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 She has an area on the medial left fourth toe with raised horned callus on top of this. I am not certain what this represents. With regards to pain she has about a 2-1/2 I will claudication tolerance in the grocery store. She has some pain in night which is relieved by putting her feet down over the side of the bed. She is wearing Nitro-Dur patches on the top of her right foot. Past medical history includes type 2 diabetes with secondary PAD, neoplasm of the skin, diabetic retinopathy, carotid artery stenosis, hyperlipidemia hypertension. 11/2; the last time the patient was here I spoke to Dr. Gwenlyn Found about revascularization options on the right. As I understand things currently Dr. Gwenlyn Found spoke with Dr. Trula Slade and ultimately the patient was taken to surgery on 10/27. She had a right iliofemoral endarterectomy with a bovine patch angioplasty. I think the plan now is for her to have a staged intervention on the right SFA by Dr. Alvester Chou. Per the patient's understanding Dr. Gwenlyn Found and Dr. Jacqualyn Posey are waiting to see when the gangrenous toes on the right foot can be amputated. The patient states her pain is a lot better and she is grateful for that. 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 Small area on the left fourth toe. She is using Betadine to the mummified areas on the right and Medihoney on the left 08/27/2019; the last time I saw this patient I discharged her from the clinic. She had been revascularized by Dr.  Brabham and she had a right iliofemoral endarterectomy with a bovine angioplasty. 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 Jacqualyn Posey of podiatry on 08/07/2019. I note that she also had a intervention by Dr. Gwenlyn Found and he performed a directional atherectomy and drug-coated balloon angioplasty of the SFA and popliteal artery on the right. I am  not certain of the date of Dr. Kennon Holter procedure as of the time of this dictation. She was referred back to [place] by Dr. Earleen Newport predominantly for follow-up of the left fourth toe. She still has sutures and stitches in the right TMA site. She states her pain is a lot better. She expresses concern about the condition of the amputation site at the TMA. She is on doxycycline I think prescribed by Dr. Earleen Newport. She is complaining of some pain at night 12/10; I spoke to Dr. Jacqualyn Posey last week. He removed the sutures on the right foot on Monday of this week. She has the area on the left fourth toe just proximal to the PIP and then the right TMA site. She is still on doxycycline and has enough through next week. Unfortunately the TMA site does not look good at all. Both on the lateral and medial part of the incisions are areas that probe to bone. There is purulence over the medial part which I have cultured. We will use silver alginate. Left fourth toe looks somewhat better but there was still exposed bone 12/17; patient has an MRI booked for 12/30. Culture I did last week showed Pseudomonas Serratia and Enterococcus. This was purulent drainage coming out of the medial part of her amputation site. I use cefdinir 300 twice daily for 10 days that started on 12/15. Her x-ray on the right showed limited evaluation for osteomyelitis. The findings could have been postoperative. There was subtle erosion in the distal first and distal fifth metatarsal. An MRI was suggested. On the left she had irregularity of the fourth middle and proximal phalanx consistent with a history of osteomyelitis. I do not know that she has a history of osteomyelitis in this area. She had a newly defined area on the plantar third toe 12/31; patient's MRI is listed below: MRI OF THE RIGHT FOREFOOT WITHOUT AND WITH CONTRAST TECHNIQUE: Multiplanar, multisequence MR imaging of the right forefoot was performed before and after the administration of  intravenous contrast. CONTRAST 6 mL GADAVIST IV SOLN : COMPARISON: Plain films right foot 09/04/2019. FINDINGS: Bones/Joint/Cartilage The patient is status post transmetatarsal amputation as seen on the prior exam. Marrow edema and enhancement are seen in all of the distal metatarsals. In the first metatarsal, signal change extends 1.5 cm proximal to the stump and in the second metatarsal extends approximately 2 cm proximal to the stump. Edema and enhancement are seen in only the distal 0.7 cm of the third metatarsal stump and tips of the fourth and fifth metatarsals. Bone marrow signal is otherwise unremarkable. A small focus of subchondral edema is seen in the lateral talus, likely degenerative. Ligaments Intact. Muscles and Tendons No intramuscular fluid collection. Soft tissues Skin ulceration is seen off the stump of the first metatarsal. A thin fluid collection tracks deep to the wound and over the anterior metatarsals worrisome for small abscess. Intense subcutaneous edema and enhancement are seen diffusely. 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 Cellulitis about the foot. Skin ulceration over  the distal first metatarsal is identified with a thin fluid collection tracking anteriorly along the stump worrisome for abscess. Small focus of subchondral edema in the lateral talus is likely degenerative. Electronically Signed By: Inge Rise M.D. On: 09/23/2019 15:25 Patient arrives in clinic today not complaining any of any pain. 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 She does not describe claudication however her activity is very limited. 10/01/2019; since the patient was last here I have communicated with Dr. Gwenlyn Found after bypass by Dr. Trula Slade and  addressing the right superficial femoral artery he states that she is widely patent through peroneal arteries to the ankle with collaterals to the dorsalis pedis. He states he is going to talk to colleagues about the feasibility of tibial pedal access. The patient seems infectious disease later this afternoon Dr. Baxter Flattery. in preparation for this she has been off antibiotics for 1 week and I went ahead and obtain pieces of the remanence of her first metatarsal for pathology and CandS. The patient is a candidate for hyperbaric oxygen. 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 1/15; considerable improvement in the wounds on her feet. We are using silver collagen. She follows with Dr. Baxter Flattery of infectious disease and is on meropenem and daptomycin. She has been taught how to do this herself at home. Follows with Dr. Graylon Good at the end of treatment here. She has 2 wounds on the surgical TMA site 1 lateral and 1 medial the lateral 1 has regressed tremendously. The area medially still has some exposed bone although the base of this looks healthy. 1/22; 2 separate wounds on the surgical TMA site. Both of these look satisfactory. The medial area does not have any exposed bone. This is an improvement On the left side fourth toe dorsally over the proximal phalanx there is a deep punched out area that probes to bone. She has an area on the tip of the left third toe. She also tells [place] that in HBO she traumatized her left Achilles and this is left her with a superficial wound area 1/28; weekly visit along with HBO. She has 5 wounds. T our punched out areas on the original TMA site on the right. Both of these appear to have contracted. o The area on the right no longer has exposed bone. She has an area on the tip of the left third toe and on the DIP of the left fourth toe. Both of these had surface callus that I removed and unfortunately they have small areas that both go  to bone. She has a traumatic wound on the left Achilles area that happened in HBO and that appears better. She is tolerating her IV antibiotics well at home. She has home health changing the dressings and she is doing it once on the weekends. We have been using silver collagen. She has been extensively revascularized on the right by Dr. Trula Slade and subsequently by Dr. Gwenlyn Found. 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 Baxter Flattery of infectious disease on 2/17. Dr. Gwenlyn Found on 2/23 and according to her her antibiotics stopped on 2/23. She is tolerating hyperbarics well. She has made a tremendous improvement in the right forefoot with only 2 smaller open wounds. Using silver collagen. On the left foot  she has the area on the tip of the left third toe and the medial part of the left fourth toe. These had exposed bone last week I did not sense any of that today 2/12; sees Dr. Graylon Good on 2/17 and Dr. Gwenlyn Found on 2/23. We are going to use Dermagraft on this today however the lateral part of her train TMA incision on the right is healed and the medial part is down so much that we just continue with the silver collagen She has wounds on the tip of her left third and the medial aspect of the left fourth. Both of these still have exposed bone I have not been able to get these to epithelialize. 2/19; she sees Dr. Gwenlyn Found on 2/23 and I have communicated with him about the left vascular supply. Looking at her angiogram from 08/03/2019 it looks as though that they could not cross the left CFA. Noted that she had one-vessel runoff bilaterally. She also apparently saw Dr. Graylon Good although I did not look up these results for preparation of this record. 2/26; Dr. Gwenlyn Found is going to do an angiogram next week on Wednesday. I think they are going to try to go at this both retrograde and anterograde to see  if anything can be done to revascularize the left lower limb. She continues to make nice progress on the remaining wound on the right medial foot on her TMA site and the toe it wounds on the left are responding nicely as well 3/12; the patient underwent an extensive and complicated revascularization on the left leg by Dr. Fletcher Anon on 3/3; she had an atherectomy on occluded left popliteal artery; anterior tibial artery followed by a balloon angioplasty. Atherectomy was also performed to the left SFA because there was still significant 50% stenosis in the left popliteal artery they performed an intravascular lithotripsy which improved the residual stenosis to 20%. The same lithotripsy was used to dilate the proximal left SFA. She had a drug-eluting stent placed I believe in the SFA. The patient returned for hyperbarics this week. She has had some eye problems on the right which she tells me are secondary to diabetic retinopathy and she saw her eye doctor. She is really made excellent progress there is no open wounds on the left foot at all. She has one open area medially and her TMA site on the right lateral wound is closed. 3/19; the patient comes in today with the area on the medial transmet on the right improved. Some debris removed from the surface revealed the still open wound. Unfortunately she now has the open area on the third toe tip and the medial aspect of the dorsal fourth toe. These are in the same location as her previous wounds. These had actually closed up last week. 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 She is undergone complex revascularizations on the right by Dr. Trula Slade and Dr. Gwenlyn Found and I believe Dr. Fletcher Anon worked on the tibial vessels on the left most recently.she had a complex transmetatarsal amputation wound with underlying osteomyelitis on presentation to the clinic and then developed deep wounds on the plantar left third toe tip and  on the medial part of the left fourth toe at the PIP. She completed IVantibiotics as directed by infectious disease and I believe a course of oral antibiotics as well.she underwent a complete 40 treatments of hyperbarics. She comes in today with the vast majority of the right TMA site healed there is a  small opening on the most medial aspect. Her left third and left fourth toes appear to have epithelialization although this has fluctuated somewhat. 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 She had a gaping wound on the right transmetatarsal amputation L as well as deep wounds on the left third and left fourth toes. She underwent complex revascularizations on her bilateral lower legs which I described on her last visit. She completed IV antibiotics by infectious disease and 40 treatments of hyperbarics. Her left third and fourth toes are healed. The area on the right transmetatarsal amputation site is also closed today. READMISSION 08/04/2020 [name] is now a 81 year old woman that we had for a complex stay in our clinic from October 2000 14 January 2020. 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 Her current problems with wounds began on October 30 when she developed a spontaneous opening over the right transmet medial aspect over the first metatarsal head. She has been using collagen on this that she had leftover from her last stay in this clinic. She also has had an open area on the medial right calf from a vein harvest site from her CABG she. She said that this is never really closed since the vein harvest was done. With regards to her arterial status this is a major issue. She had an angiogram by Dr. Gwenlyn Found in September 2020. She developed a gangrenous right first and second toes. Ultimately she was referred to Dr. Trula Slade who did a right  common femoral endarterectomy with patch angioplasty. This was on 07/21/2019 and she really had a good result. Dr. Gwenlyn Found did a atherectomy followed by a drug coated balloon angioplasty of her right SFA popliteal and tibial peroneal trunk on 08/03/2019 again with excellent excellent results. Follow-up.Dopplers in November revealed revealed a widely patent SFA and tibioperoneal trunk. UNFORTUNATELY her recent angiogram that was done on 08/01/2020 showed an 80% right common femoral artery stenosis just above the previous endarterectomy site occluded right right SFA at the origin reconstituting the adductor canal by the profunda femoris collaterals. The profunda femoris is also diffusely diseased. There is and again another 80% tibioperoneal trunk stenosis and one-vessel runoff via the peroneal artery. She is seen Dr. Donzetta Matters and she is scheduled for a femoral endarterectomy and right femoral-popliteal bypass grafting with endarterectomy of her tibial peroneal trunk. The scheduled surgery is on 11/23 The patient does not complain of pain at rest. She does have 5-minute walking claudication. The wounds on her right first met head remanent was spontaneous she does not think she hit this. As mentioned in the area on her right medial calf is a vein harvest site Dr. Gwenlyn Found had texted me about getting her in the clinic and we have arranged this as soon as we can. I have told her that we will have to see how successful Dr. Donzetta Matters is before we know what can be done about the wounds in a precise fashion. Fortunately there does not seem to be any obvious infection involving the right foot although that may need to be looked at if things really deteriorate. She was treated with IV antibiotics and hyperbaric oxygen for her previous osteomyelitis 12/2; the patient underwent a right femoral endarterectomy and bypass femoral to popliteal artery. This was done on 08/16/2020. She is done remarkably well. She has a small  area on  the right anterior tibia and a small area in the medial part of her original right transmetatarsal amputation. She appears to have had an excellent response to surgery. 12/16; the patient's area on the right tibia and the small area on the medial part of her original right transmetatarsal amputation is totally healed READMISSION 06/15/2021 [name] is a now 81 year old woman who arrives for review of wounds on the left TMA site extending into her plantar foot as well as an area on the left posterior calcaneus. 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 She was extensively revascularized on the right at that time as noted above in our previous notes ended up receiving IV antibiotics and hyperbaric oxygen therapy. Miraculously this TMA site actually closed and she really has had not any trouble with this since. UNFORTUNATELY the same cannot be said of her left side. Her problem apparently started sometime in July when she hit her left third toe on a barstool while playing pool. She developed an acute infection which eventually led to underlying osteomyelitis. She underwent an extensive hospitalization from 04/27/2021 through 05/15/2021. She received IV antibiotics. Also extensive intervention by Dr. Stanford Breed of vein and vascular which included a left common femoral to tibioperoneal trunk bypass as well as a left third toe amputation on 05/03/2021. Unfortunately the area did not heal she ended up requiring a left TMA. By her description this was left open. She has been using a wound VAC on this to earlier this week and then was switched to Santyl. I think because of the left forefoot off loader she is also developed an area on the left posterior calcaneus. She is not complaining of a lot of pain. She has her daughter at home to change the dressings. She has been prescribed Santyl and she has a tube that she is brought  into our clinic. I cannot get a lot of history of claudication although she is really not that mobile at this point walking with a walker. 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 She has had multiple interventions on the right side by vein and vascular and also by Dr. Gwenlyn Found as well as a transmetatarsal amputation on the right. She has had follow-up noninvasive studies on 06/06/2021; on the left that showed an ABI of 0.47 with monophasic waveforms a dorsalis pedis ABI of 0.40 with monophasic waveforms. Her ANGIOGRAM which was initially done by Dr. Oneida Alar on 04/28/2021 showed severe left foot superficial femoral artery occlusive disease and an occlusion of the left popliteal artery with two-vessel runoff to the left foot with moderate to severe disease below the knee popliteal artery and tibial disease. She had 80% distal right external iliac artery above the existing femoral above-knee popliteal bypass but she does not have an open wound on the right foot She arrives in clinic with an extensive open area on the TMA site which in the mid aspect extends into the plantar foot. She also has a more superficial area on the upper part of her Achilles area of the left heel 9/29; patient arrives in clinic today with slough over the TMA site. We use MolecuLight to look at the surface of this suggesting cyan and white discoloration over a large part of this wound also extending into the  third metatarsal area. She also has the area on the left posterior calcaneus. This is slough covered we will switch to [facility] here as well. There is some warmth in the area and some erythema we will give her antibiotics as well 10/6; completely necrotic surface once again on the left TMA site. We have been using Santyl and Hydrofera Blue. She has an area on the left posterior Achilles and  actually states this has been more painful this week indeed there is some erythema around this area. She also describes I think claudication at rest which is relieved by dipping her left leg over the side of the bed at night. 10/13; our intake nurse was able to brush some slough off the surface of the left TMA hence this did not require debridement that is better than last week. The area heading towards the plantar part of her foot required an aggressive debridement to clean this up. This is quite a sizable divot but does not go to bone. The area on the right Achilles heel is also covered in a high 100% nonviable tissue we have been using Santyl and silver alginate 10/20; the area on the left TMA looks much better. Even the area is spreading into the midfoot looks improved. The right Achilles still is covered and nonviable surface 100%. We have been using Santyl and silver alginate and this will continue for this week The patient is approved for 5 Apligraf's and I think she is good enough to start using these next week 10/27; the patient's wounds actually look quite good. We have been using Santyl and silver alginate. We applied Apligraf #1 today Saw Dr. Stanford Breed her vascular surgeon on 10/25 he told her TMA was healing nicely. She will follow-up with them in December to repeat noninvasive testing. 08/16/2021 upon evaluation today patient appears to be doing much better in regard to her wounds. Both are showing signs of improvement which is great news. I am going to perform some debridement to clear away some of the necrotic debris currently this will just be a light debridement. Nonetheless other than that I feel like that the Apligraf is doing quite well working and reapply today and this will be #3 as far as the application is concerned. 12/20; since the patient was last here she was seen by Dr. Stanford Breed who felt that her Doppler flow was sluggish in the bypass. She therefore went underwent a urgent  angiogram on 09/07/2021 by Dr. Carlis Abbott. She had an angioplasty of the distal anastomosis and the anterior tibial artery. She tolerated this well. She had no flow-limiting stenosis in the aortoiliac segment on the left. Some calcification in the left common iliac artery but it was not flow-limiting the common femoral and profunda were patent. The left lower extremity bypass had brisk flow with no significant proximal anastomosis on the common femoral artery. Distally there was a high-grade stenosis where the bypass anastomosis was performed. This was angioplastied as well as the anterior tibial artery. 12/28; the patient comes in early because of a wound over the tip of her right first metatarsal head. She is not really sure how this happened. Says she noticed it when she took her sock off on Friday before she got in the shower. She has been using Hydrofera Blue to this area. This is the original wounded site we dealt with in the setting of a previous right TMA. The patient has PA I researched this on [employer] link. As it turns out she  has had recent arterial studies noninvasive on 09/05/2021. On the right she had dampened monophasic waveforms at the popliteal absent waveforms at the PTA and dorsalis pedis. An ABI could not be obtained previously at 0.47. The comment states that they were unable to detect Doppler waveforms of the posterior tibial and dorsalis pedis arteries. 2023 09/26/2021; her original left TMA wound is almost closed. I reapplied Apligraf to the remaining area. The area on the plantar foot is also closed as far as I can tell HOWEVER she comes in with a second wound on the right first metatarsal head remanent both of these probe to bone. I did a bone scraping of this last week which only showed Corynebacterium unlikely to be a true pathogen. Nevertheless I am going to put her on Augmentin today probably for 2 weeks. X-ray will be done today. She has an appointment with Dr. Gwenlyn Saran at vein  and vascular next Tuesday I will send him a note about my concerns about the right TM 1/10; we inadvertently remove the Apligraf from the left surgical site however everything looks closed here. She still has 2 probing wounds over the remanent of her first metatarsal head both go to bone although there is less erythema and swelling in the area. She does not have an appointment with Dr. Donzetta Matters until next Tuesday. I am going to go ahead and order an MRI of the right foot I had originally planned to have or at least consider HBO if the patient has underlying osteomyelitis in the right foot however she tells me that she lost a significant amount of her visual field during her last run with HBO in the right eye nasal aspect. She saw Dr. Zadie Rhine who is a retinal specialist I will see if I can look at his records. She is still on Augmentin as finishes on Monday. 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 She has been on Augmentin with some improvement in the surrounding erythema although the small wound on the tip and the plantar aspect still probe easily to bone and there does not appear to be any viable tissue. UNFORTUNATELY arrives in clinic today with new areas on the right lateral foot neither 1 of these look particularly healthy. No debridement. The patient has an appoint with Dr. Donzetta Matters this morning my question is angiography of the right lego. I also wonder about an infectious disease appointment and I will continue her Augmentin but refer her for consideration. I did not get a culture that was positive even from a bone scraping however. 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 She saw her retina doctor Dr. Deloria Lair and her interpretation of this was that the hyperbarics may have contributed to a blockage. I wonder whether this was a central retinal vein or artery occlusion. I am not sure that Dr. Zadie Rhine documents in epic I will need to see if I can find his note 1/25; this is [name] returns to clinic having a very eventful week. Firstly she underwent angiography by Dr. Stanford Breed of vein and vascular.. Her preintervention ABI was 0. She underwent a right  iliofemoral drug-coated balloon angioplasty, right popliteal drug-coated balloon angioplasty and a right tibial peroneal trunk and peroneal angioplasty by Dr. Stanford Breed. She tolerated this well. She definitely has better blood flow to the right foot. She saw Dr. Doren Custard in follow-up who felt her Doppler flow in the right foot was good. An x-ray did not show any evidence of osteomyelitis however I noted a previous MRI certainly did. She also saw infectious diseases been started on IV daptomycin and ertapenem. She had the PICC line placed. I had started her on Augmentin on February 3 and that had some improvement in the erythema and swelling over the remanent of her first metatarsal head. As far as I am aware she did not have a specific organism cultured at least not by me although I did try. Everything on the left is closed including the left transmit amputation site and the area on her left posterior heel/Achilles. We are using silver collagen on the right foot now which has 2 open wounds on the right first metatarsal head and the right fifth metatarsal head 2/1; the patient's left foot transmit site and the area on her heel remain closed. Some eschar but I do not think this requires debridement She has areas on the right first and right fifth met heads underlying osteomyelitis she is on IV daptomycin and ertapenem. The areas are small but probe to bone. I have debrided  with a #3 curette hemostasis with direct pressure I am able to get to some decent looking surrounding granulation tissue on the more worrisome areas on the right first metatarsal head 2/8; left foot transverse metatarsal amputation site which was the original wound we were dealing with this in the clinic remains closed looks very healthy even that he part that extended towards her plantar foot in the mid aspect of the incision. Her right transmetatarsal amputation site appears less erythematous and less swollen however the wound on the first met head probes to bone. The bone itself does not feel that vibrant. The wounds on the fifth metatarsal head also on the right have eschar on the surface I did not consider debriding this today. 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 Dr. 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 We will therefore pursue hyperbaric oxygen therapy and treatment of the underlying refractory osteomyelitis 2/15; the patient's right foot actually looks a lot better 2 areas over the fifth met head are closed. 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 She is on IV antibiotics and has been approved for hyperbarics. In the meantime the whole right first met head remanent looks a lot better with antibiotics less swelling and less erythema but still with the open area probing to bone Electronic Signature(s) Signed: 11/08/2021 4:36:32 PM By: [name] MD Entered By: [name] on 11/08/2021 13:26:58 -------------------------------------------------------------------------------- Physical Exam Details Patient Name: Date of Service: [name], [name] Calamity LLY H. 11/08/2021 12:30  PM Medical Record Number: [id number] Patient Account Number: [id number] Date of Birth/Sex: Treating RN: [birth year] (81 y.o. [name] Primary Care Provider: [name] Other Clinician: Referring Provider: Treating Provider/[name]: Tressie Stalker in Treatment: 65 Constitutional Patient is hypertensive.. Pulse regular and within target range  for patient.Marland Kitchen Respirations regular, non-labored and within target range.. Temperature is normal and within the target range for the patient.Marland Kitchen Appears in no distress. Notes Wound exam; right first metatarsal head less swelling less erythema still with a wound probing to bone Electronic Signature(s) Signed: 11/08/2021 4:36:32 PM By: [name] MD Entered By: [name] on 11/08/2021 13:29:01 -------------------------------------------------------------------------------- Physician Orders Details Patient Name: Date of Service: [name], [name]. 11/08/2021 12:30 PM Medical Record Number: [id number] Patient Account Number: [id number] Date of Birth/Sex: Treating RN: 20-Mar-1941 (81 y.o. 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 - with Dr. Dellia Nims room 2 - 1230 prior to HBO Return A Bathing/ Shower/ Hygiene May shower with protection but do not get wound  dressing(s) wet. Edema Control - Lymphedema / SCD / Other Elevate legs to the level of the heart or above for 30 minutes daily and/or when sitting, a frequency of: - throughout the day Avoid standing for long periods of time. Home Health No change in wound care orders this week; continue Home Health for wound care. May utilize formulary equivalent dressing for wound treatment orders unless otherwise specified. Dressing changes to be completed by [facility] on Monday / Wednesday / Friday except when patient has scheduled visit at [facility] 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 Follow Hyperbaric Oxygen Glycemia Protocol Afrin (Oxymetazoline HCL) 0.05% nasal spray - 1 spray in both nostrils daily as needed prior to HBO treatment for difficulty clearing ears Wound Treatment Wound #15 - Amputation Site - Transmetatarsal Wound Laterality: Right Cleanser: Soap and Water Every Other Day/30 Days Discharge Instructions: May shower and wash wound with dial antibacterial soap and water prior to dressing change. Cleanser: Wound Cleanser [facility]) Every Other Day/30 Days Discharge Instructions: Cleanse the wound with wound cleanser prior to applying a clean dressing using gauze sponges, not tissue or cotton balls. Prim Dressing: Promogran Prisma Matrix, 4.34 (sq in) (silver collagen) (Home Health) Every Other Day/30 Days ary Discharge Instructions: Moisten collagen with [place] Jelly Secondary Dressing: Woven Gauze Sponges 2x2 in [facility]) Every Other Day/30 Days Discharge Instructions: Apply over primary dressing as  directed. Secured With: [employer], 4.5x3.1 (in/yd) [facility]) Every Other Day/30 Days Discharge Instructions: Secure with Kerlix as directed. Secured With: Transpore Surgical Tape, 2x10 (in/yd) [facility]) Every Other Day/30 Days Discharge Instructions: Secure dressing with tape as directed. GLYCEMIA INTERVENTIONS PROTOCOL PRE-HBO GLYCEMIA INTERVENTIONS ACTION INTERVENTION Obtain pre-HBO capillary blood glucose (ensure 1 physician order is in chart). A. Notify HBO physician and await physician orders. 2 If result is 70 mg/dl or below: B. If the result meets the hospital definition of a critical result, follow hospital policy. A. Give  patient an 8 ounce Glucerna Shake, an 8 ounce Ensure, or 8 ounces of a Glucerna/Ensure equivalent dietary supplement*. B. Wait 30 minutes. If result is 71 mg/dl to 130 mg/dl: C. Retest patients capillary blood glucose (CBG). D. If result greater than or equal to 110 mg/dl, proceed with HBO. If result less than 110 mg/dl, notify HBO physician and consider holding HBO. If result is 131 mg/dl to 249 mg/dl: A. Proceed with HBO. A. Notify HBO physician and await physician orders. B. It is recommended to hold HBO and do If result is 250 mg/dl or greater: blood/urine ketone testing. C. If the result meets the hospital definition of a critical result, follow hospital policy. POST-HBO GLYCEMIA INTERVENTIONS ACTION INTERVENTION Obtain post HBO capillary blood glucose (ensure 1 physician order is in chart). A. Notify HBO physician and await physician orders. 2 If result is 70 mg/dl or below: B. If the result meets the hospital definition of a critical result, follow hospital policy. A. Give patient an 8 ounce Glucerna Shake, an 8 ounce Ensure, or 8 ounces of a Glucerna/Ensure equivalent dietary supplement*. B. Wait 15 minutes for symptoms of If result is 71 mg/dl to 100 mg/dl: hypoglycemia (i.e. nervousness,  anxiety, sweating, chills, clamminess, irritability, confusion, tachycardia or dizziness). C. If patient asymptomatic, discharge patient. If patient symptomatic, repeat capillary blood glucose (CBG) and notify HBO physician. If result is 101 mg/dl to 249 mg/dl: A. Discharge patient. A. Notify HBO physician and await physician orders. B. It is recommended to do blood/urine ketone B. It is recommended to do blood/urine ketone If result is 250 mg/dl or greater: testing. C. If the result meets the hospital definition of a critical result, follow hospital policy. *Juice or candies are NOT equivalent products. If patient refuses the Glucerna or Ensure, please consult the hospital dietitian for an appropriate substitute. Electronic Signature(s) Signed: 11/08/2021 4:36:32 PM By: [name] MD Signed: 11/08/2021 5:14:15 PM By: Baruch Gouty RN, BSN Entered By: Baruch Gouty on 11/08/2021 13:06:07 -------------------------------------------------------------------------------- Problem List Details Patient Name: Date of Service: [name], [name] 11/08/2021 12:30 PM Medical Record Number: [id number] Patient Account Number: [id number] Date of Birth/Sex: Treating RN: Oct 30, 1940 (81 y.o. [name] Primary Care Provider: [name] Other Clinician: Referring Provider: Treating Provider/[name]: Tressie Stalker in Treatment: 20 Active Problems ICD-10 Encounter Code Description Active Date MDM Diagnosis E11.621 Type 2 diabetes mellitus with foot ulcer 06/15/2021 No Yes T81.31XD Disruption of external operation (surgical) wound, not elsewhere classified, 06/15/2021 No Yes subsequent encounter L97.514 Non-pressure chronic ulcer of other part of right foot with necrosis of bone 09/20/2021 No Yes E11.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 06/15/2021 No Yes 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, right ankle  and foot 10/25/2021 No Yes Inactive Problems ICD-10 Code Description Active Date Inactive Date L97.428 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 11/08/2021 12:30 PM Medical Record Number: [id number] Patient Account Number: [id number] Date of Birth/Sex: Treating RN: 23-Jun-1941 (81 y.o. [name] Primary Care Provider: [name] Other Clinician: Referring Provider: Treating Provider/[name]: Tressie Stalker in Treatment: 20 Subjective History of Present Illness (HPI) ADMISSION 07/13/2019 Patient is a  81 year old type II diabetic. She has known PAD. She has been followed by Dr. Jacqualyn Posey of podiatry for blistering areas on her toes dating back to 04/30/2019 which at this was the left fourth toe. By 8/11 she had wounds on the right and left second toes. She underwent arterial studies by Dr. Alvester Chou on 7/31 that showed ABIs on the right of 0.60 TBI of 0.26 on the left ABI of 0.56 and a TBI of 0.25. By 9/10 she had ischemic-looking wounds per Dr. Earleen Newport on the right first, left first second and third. She has been using Medihoney and then mupirocin more recently simply Betadine. The patient underwent angiography by Dr. Gwenlyn Found on 9/21. 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 Noted that there was 1 vessel runoff in the foot via the peroneal. ooOn the left there was an 80% left  SFA, 70% mid left SFA. There was a short segment calcified CTO distal less than SFA and above-knee popliteal artery reconstituting with two-vessel runoff. 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 It was felt that she had bilateral SFA disease as well as tibial vessel disease. An attempt was made to revascularize the left SFA but they were unable to cross because of the highly calcified nature of the lesion. 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 She has an area on the medial left fourth toe with raised horned callus on top of this. I am not certain what this represents. With regards to pain she has about a 2-1/2 I will claudication tolerance in the grocery store. She has some pain in night which is relieved by putting her feet down over the side of the bed. She is wearing Nitro-Dur patches on the top of her right foot. Past medical history includes type 2 diabetes with secondary PAD, neoplasm of the skin, diabetic retinopathy, carotid artery stenosis, hyperlipidemia hypertension. 11/2; the last time the patient was here I spoke to Dr. Gwenlyn Found about revascularization options on the right. As I understand things currently Dr. Gwenlyn Found spoke with Dr. Trula Slade and ultimately the patient was taken to surgery on 10/27. She had a right iliofemoral endarterectomy with a bovine patch angioplasty. I think the plan now is for her to have a staged intervention on the right SFA by Dr. Alvester Chou. Per the patient's understanding Dr. Gwenlyn Found and Dr. Jacqualyn Posey are waiting to see when the gangrenous toes on the right foot can be amputated. The patient states her pain is a lot better and she is grateful for that. 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 Small area on the left fourth toe. She is using Betadine to the mummified areas on the right and Medihoney on the  left 08/27/2019; the last time I saw this patient I discharged her from the clinic. She had been revascularized by Dr. Trula Slade and she had a right iliofemoral endarterectomy with a bovine angioplasty. 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 Jacqualyn Posey of podiatry on 08/07/2019. I note that she also had a intervention by Dr. Gwenlyn Found and he performed a directional atherectomy and drug-coated balloon angioplasty of the SFA and popliteal artery on the right. I am not certain of the date of Dr.  Berry's procedure as of the time of this dictation. She was referred back to [place] by Dr. Earleen Newport predominantly for follow-up of the left fourth toe. She still has sutures and stitches in the right TMA site. She states her pain is a lot better. She expresses concern about the condition of the amputation site at the TMA. She is on doxycycline I think prescribed by Dr. Earleen Newport. She is complaining of some pain at night 12/10; I spoke to Dr. Jacqualyn Posey last week. He removed the sutures on the right foot on Monday of this week. She has the area on the left fourth toe just proximal to the PIP and then the right TMA site. She is still on doxycycline and has enough through next week. Unfortunately the TMA site does not look good at all. Both on the lateral and medial part of the incisions are areas that probe to bone. There is purulence over the medial part which I have cultured. We will use silver alginate. Left fourth toe looks somewhat better but there was still exposed bone 12/17; patient has an MRI booked for 12/30. Culture I did last week showed Pseudomonas Serratia and Enterococcus. This was purulent drainage coming out of the medial part of her amputation site. I use cefdinir 300 twice daily for 10 days that started on 12/15. Her x-ray on the right showed limited evaluation for osteomyelitis. The findings could have been postoperative. There was subtle erosion in the distal first  and distal fifth metatarsal. An MRI was suggested. On the left she had irregularity of the fourth middle and proximal phalanx consistent with a history of osteomyelitis. I do not know that she has a history of osteomyelitis in this area. She had a newly defined area on the plantar third toe 12/31; patient's MRI is listed below: MRI OF THE RIGHT FOREFOOT WITHOUT AND WITH CONTRAST TECHNIQUE: Multiplanar, multisequence MR imaging of the right forefoot was performed before and after the administration of intravenous contrast. CONTRAST 6 mL GADAVIST IV SOLN : COMPARISON: Plain films right foot 09/04/2019. FINDINGS: Bones/Joint/Cartilage The patient is status post transmetatarsal amputation as seen on the prior exam. Marrow edema and enhancement are seen in all of the distal metatarsals. In the first metatarsal, signal change extends 1.5 cm proximal to the stump and in the second metatarsal extends approximately 2 cm proximal to the stump. Edema and enhancement are seen in only the distal 0.7 cm of the third metatarsal stump and tips of the fourth and fifth metatarsals. Bone marrow signal is otherwise unremarkable. A small focus of subchondral edema is seen in the lateral talus, likely degenerative. Ligaments Intact. Muscles and Tendons No intramuscular fluid collection. Soft tissues Skin ulceration is seen off the stump of the first metatarsal. A thin fluid collection tracks deep to the wound and over the anterior metatarsals worrisome for small abscess. Intense subcutaneous edema and enhancement are seen diffusely. 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 Cellulitis about the foot. Skin ulceration over the distal first metatarsal is identified with a thin fluid collection tracking anteriorly along the stump worrisome for abscess. Small  focus of subchondral edema in the lateral talus is likely degenerative. Electronically Signed By: Inge Rise M.D. On: 09/23/2019 15:25 Patient arrives in clinic today not complaining any of any pain. 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 She  does not describe claudication however her activity is very limited. 10/01/2019; since the patient was last here I have communicated with Dr. Gwenlyn Found after bypass by Dr. Trula Slade and addressing the right superficial femoral artery he states that she is widely patent through peroneal arteries to the ankle with collaterals to the dorsalis pedis. He states he is going to talk to colleagues about the feasibility of tibial pedal access. The patient seems infectious disease later this afternoon Dr. Baxter Flattery. in preparation for this she has been off antibiotics for 1 week and I went ahead and obtain pieces of the remanence of her first metatarsal for pathology and CandS. The patient is a candidate for hyperbaric oxygen. 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 1/15; considerable improvement in the wounds on her feet. We are using silver collagen. She follows with Dr. Baxter Flattery of infectious disease and is on meropenem and daptomycin. She has been taught how to do this herself at home. Follows with Dr. Graylon Good at the end of treatment here. She has 2 wounds on the surgical TMA site 1 lateral and 1 medial the lateral 1 has regressed tremendously. The area medially still has some exposed bone although the base of this looks healthy. 1/22; 2 separate wounds on the surgical TMA site. Both of these look satisfactory. The medial area does not have any exposed bone. This is an improvement On the left side fourth toe dorsally over the proximal phalanx there is a deep punched out area that probes to bone. She has an area on the tip of the left third toe.  She also tells [place] that in HBO she traumatized her left Achilles and this is left her with a superficial wound area 1/28; weekly visit along with HBO. She has 5 wounds. T our punched out areas on the original TMA site on the right. Both of these appear to have contracted. o The area on the right no longer has exposed bone. She has an area on the tip of the left third toe and on the DIP of the left fourth toe. Both of these had surface callus that I removed and unfortunately they have small areas that both go to bone. She has a traumatic wound on the left Achilles area that happened in HBO and that appears better. She is tolerating her IV antibiotics well at home. She has home health changing the dressings and she is doing it once on the weekends. We have been using silver collagen. She has been extensively revascularized on the right by Dr. Trula Slade and subsequently by Dr. Gwenlyn Found. 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 Baxter Flattery of infectious disease on 2/17. Dr. Gwenlyn Found on 2/23 and according to her her antibiotics stopped on 2/23. She is tolerating hyperbarics well. She has made a tremendous improvement in the right forefoot with only 2 smaller open wounds. Using silver collagen. On the left foot she has the area on the tip of the left third toe and the medial part of the left fourth toe. These had exposed bone last week I did not sense any of that today 2/12; sees Dr. Graylon Good on 2/17 and Dr. Gwenlyn Found on 2/23. We are going to use Dermagraft on this today however the lateral part of her train TMA incision on the right is healed and the medial  part is down so much that we just continue with the silver collagen She has wounds on the tip of her left third and the medial aspect of the left fourth. Both of these still have exposed bone I have not been able to get these  to epithelialize. 2/19; she sees Dr. Gwenlyn Found on 2/23 and I have communicated with him about the left vascular supply. Looking at her angiogram from 08/03/2019 it looks as though that they could not cross the left CFA. Noted that she had one-vessel runoff bilaterally. She also apparently saw Dr. Graylon Good although I did not look up these results for preparation of this record. 2/26; Dr. Gwenlyn Found is going to do an angiogram next week on Wednesday. I think they are going to try to go at this both retrograde and anterograde to see if anything can be done to revascularize the left lower limb. She continues to make nice progress on the remaining wound on the right medial foot on her TMA site and the toe it wounds on the left are responding nicely as well 3/12; the patient underwent an extensive and complicated revascularization on the left leg by Dr. Fletcher Anon on 3/3; she had an atherectomy on occluded left popliteal artery; anterior tibial artery followed by a balloon angioplasty. Atherectomy was also performed to the left SFA because there was still significant 50% stenosis in the left popliteal artery they performed an intravascular lithotripsy which improved the residual stenosis to 20%. The same lithotripsy was used to dilate the proximal left SFA. She had a drug-eluting stent placed I believe in the SFA. The patient returned for hyperbarics this week. She has had some eye problems on the right which she tells me are secondary to diabetic retinopathy and she saw her eye doctor. She is really made excellent progress there is no open wounds on the left foot at all. She has one open area medially and her TMA site on the right lateral wound is closed. 3/19; the patient comes in today with the area on the medial transmet on the right improved. Some debris removed from the surface revealed the still open wound. ooUnfortunately she now has the open area on the third toe tip and the medial aspect of the dorsal fourth  toe. These are in the same location as her previous wounds. These had actually closed up last week. 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 She is undergone complex revascularizations on the right by Dr. Trula Slade and Dr. Gwenlyn Found and I believe Dr. 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 She completed IVantibiotics as directed by infectious disease and I believe a course of oral antibiotics as well.she underwent a complete 40 treatments of hyperbarics. She comes in today with the vast majority of the right TMA site healed there is a small opening on the most medial aspect. Her left third and left fourth toes appear to have epithelialization although this has fluctuated somewhat. 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 She had a gaping wound on the right transmetatarsal amputation L as well as deep wounds on the left third and left fourth toes. She underwent complex revascularizations on her bilateral lower legs which I described on her last visit. She  completed IV antibiotics by infectious disease and 40 treatments of hyperbarics. Her left third and fourth toes are healed. The area on the right transmetatarsal amputation site is also closed today. READMISSION 08/04/2020 [name] is now a 81 year old woman that we had for a complex stay in our clinic from October 2000 14 January 2020. 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 Her current problems with wounds began on October 30 when she developed a spontaneous opening over the right transmet medial aspect  over the first metatarsal head. She has been using collagen on this that she had leftover from her last stay in this clinic. She also has had an open area on the medial right calf from a vein harvest site from her CABG she. She said that this is never really closed since the vein harvest was done. With regards to her arterial status this is a major issue. She had an angiogram by Dr. Gwenlyn Found in September 2020. She developed a gangrenous right first and second toes. Ultimately she was referred to Dr. Trula Slade who did a right common femoral endarterectomy with patch angioplasty. This was on 07/21/2019 and she really had a good result. Dr. Gwenlyn Found did a atherectomy followed by a drug coated balloon angioplasty of her right SFA popliteal and tibial peroneal trunk on 08/03/2019 again with excellent excellent results. Follow-up.Dopplers in November revealed revealed a widely patent SFA and tibioperoneal trunk. UNFORTUNATELY her recent angiogram that was done on 08/01/2020 showed an 80% right common femoral artery stenosis just above the previous endarterectomy site occluded right right SFA at the origin reconstituting the adductor canal by the profunda femoris collaterals. The profunda femoris is also diffusely diseased. There is and again another 80% tibioperoneal trunk stenosis and one-vessel runoff via the peroneal artery. She is seen Dr. Donzetta Matters and she is scheduled for a femoral endarterectomy and right femoral-popliteal bypass grafting with endarterectomy of her tibial peroneal trunk. The scheduled surgery is on 11/23 The patient does not complain of pain at rest. She does have 5-minute walking claudication. The wounds on her right first met head remanent was spontaneous she does not think she hit this. As mentioned in the area on her right medial calf is a vein harvest site Dr. Gwenlyn Found had texted me about getting her in the clinic and we have arranged this as soon as we can. I have told her that we will have to  see how successful Dr. Donzetta Matters is before we know what can be done about the wounds in a precise fashion. Fortunately there does not seem to be any obvious infection involving the right foot although that may need to be looked at if things really deteriorate. She was treated with IV antibiotics and hyperbaric oxygen for her previous osteomyelitis 12/2; the patient underwent a right femoral endarterectomy and bypass femoral to popliteal artery. This was done on 08/16/2020. She is done remarkably well. She has a small area on the right anterior tibia and a small area in the medial part of her original right transmetatarsal amputation. She appears to have had an excellent response to surgery. 12/16; the patient's area on the right tibia and the small area on the medial part of her original right transmetatarsal amputation is totally healed READMISSION 06/15/2021 [name] is a now 81 year old woman who arrives for review of wounds on  the left TMA site extending into her plantar foot as well as an area on the left posterior calcaneus. 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 She was extensively revascularized on the right at that time as noted above in our previous notes ended up receiving IV antibiotics and hyperbaric oxygen therapy. Miraculously this TMA site actually closed and she really has had not any trouble with this since. UNFORTUNATELY the same cannot be said of her left side. Her problem apparently started sometime in July when she hit her left third toe on a barstool while playing pool. She developed an acute infection which eventually led to underlying osteomyelitis. She underwent an extensive hospitalization from 04/27/2021 through 05/15/2021. She received IV antibiotics. Also extensive intervention by Dr. Stanford Breed of vein and vascular which included a left common femoral to tibioperoneal trunk bypass as  well as a left third toe amputation on 05/03/2021. Unfortunately the area did not heal she ended up requiring a left TMA. By her description this was left open. She has been using a wound VAC on this to earlier this week and then was switched to Santyl. I think because of the left forefoot off loader she is also developed an area on the left posterior calcaneus. She is not complaining of a lot of pain. She has her daughter at home to change the dressings. She has been prescribed Santyl and she has a tube that she is brought into our clinic. I cannot get a lot of history of claudication although she is really not that mobile at this point walking with a walker. 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 She has had multiple interventions on the right side by vein and vascular and also by Dr. Gwenlyn Found as well as a transmetatarsal amputation on the right. She has had follow-up noninvasive studies on 06/06/2021; on the left that showed an ABI of 0.47 with monophasic waveforms a dorsalis pedis ABI of 0.40 with monophasic waveforms. Her ANGIOGRAM which was initially done by Dr. Oneida Alar on 04/28/2021 showed severe left foot superficial femoral artery occlusive disease and an occlusion of the left popliteal artery with two-vessel runoff to the left foot with moderate to severe disease below the knee popliteal artery and tibial disease. She had 80% distal right external iliac artery above the existing femoral above-knee popliteal bypass but she does not have an open wound on the right foot She arrives in clinic with an extensive open area on the TMA site which in the mid aspect extends into the plantar foot. She also has a more superficial area on the upper part of her Achilles area of the left heel 9/29; patient arrives in clinic today with slough over the TMA site. We use  MolecuLight to look at the surface of this suggesting cyan and white discoloration over a large part of this wound also extending into the third metatarsal area. She also has the area on the left posterior calcaneus. This is slough covered we will switch to [facility] here as well. There is some warmth in the area and some erythema we will give her antibiotics as well 10/6; completely necrotic surface once again on the left TMA site. We have been using Santyl and Hydrofera Blue. She has an area on the left posterior Achilles and  actually states this has been more painful this week indeed there is some erythema around this area. She also describes I think claudication at rest which is relieved by dipping her left leg over the side of the bed at night. 10/13; our intake nurse was able to brush some slough off the surface of the left TMA hence this did not require debridement that is better than last week. The area heading towards the plantar part of her foot required an aggressive debridement to clean this up. This is quite a sizable divot but does not go to bone. The area on the right Achilles heel is also covered in a high 100% nonviable tissue we have been using Santyl and silver alginate 10/20; the area on the left TMA looks much better. Even the area is spreading into the midfoot looks improved. The right Achilles still is covered and nonviable surface 100%. We have been using Santyl and silver alginate and this will continue for this week The patient is approved for 5 Apligraf's and I think she is good enough to start using these next week 10/27; the patient's wounds actually look quite good. We have been using Santyl and silver alginate. We applied Apligraf #1 today Saw Dr. Stanford Breed her vascular surgeon on 10/25 he told her TMA was healing nicely. She will follow-up with them in December to repeat noninvasive testing. 08/16/2021 upon evaluation today patient appears to be doing much better in regard to  her wounds. Both are showing signs of improvement which is great news. I am going to perform some debridement to clear away some of the necrotic debris currently this will just be a light debridement. Nonetheless other than that I feel like that the Apligraf is doing quite well working and reapply today and this will be #3 as far as the application is concerned. 12/20; since the patient was last here she was seen by Dr. Stanford Breed who felt that her Doppler flow was sluggish in the bypass. She therefore went underwent a urgent angiogram on 09/07/2021 by Dr. Carlis Abbott. She had an angioplasty of the distal anastomosis and the anterior tibial artery. She tolerated this well. She had no flow-limiting stenosis in the aortoiliac segment on the left. Some calcification in the left common iliac artery but it was not flow-limiting the common femoral and profunda were patent. The left lower extremity bypass had brisk flow with no significant proximal anastomosis on the common femoral artery. Distally there was a high-grade stenosis where the bypass anastomosis was performed. This was angioplastied as well as the anterior tibial artery. 12/28; the patient comes in early because of a wound over the tip of her right first metatarsal head. She is not really sure how this happened. Says she noticed it when she took her sock off on Friday before she got in the shower. She has been using Hydrofera Blue to this area. This is the original wounded site we dealt with in the setting of a previous right TMA. The patient has PA I researched this on [place] link. As it turns out she has had recent arterial studies noninvasive on 09/05/2021. On the right she had dampened monophasic waveforms at the popliteal absent waveforms at the PTA and dorsalis pedis. An ABI could not be obtained previously at 0.47. The comment states that they were unable to detect Doppler waveforms of the posterior tibial and dorsalis pedis  arteries. 2023 09/26/2021; her original left TMA wound is almost closed. I reapplied Apligraf to the remaining  area. The area on the plantar foot is also closed as far as I can tell HOWEVER she comes in with a second wound on the right first metatarsal head remanent both of these probe to bone. I did a bone scraping of this last week which only showed Corynebacterium unlikely to be a true pathogen. Nevertheless I am going to put her on Augmentin today probably for 2 weeks. X-ray will be done today. She has an appointment with Dr. Gwenlyn Saran at vein and vascular next Tuesday I will send him a note about my concerns about the right TM 1/10; we inadvertently remove the Apligraf from the left surgical site however everything looks closed here. She still has 2 probing wounds over the remanent of her first metatarsal head both go to bone although there is less erythema and swelling in the area. She does not have an appointment with Dr. Donzetta Matters until next Tuesday. I am going to go ahead and order an MRI of the right foot I had originally planned to have or at least consider HBO if the patient has underlying osteomyelitis in the right foot however she tells me that she lost a significant amount of her visual field during her last run with HBO in the right eye nasal aspect. She saw Dr. Zadie Rhine who is a retinal specialist I will see if I can look at his records. She is still on Augmentin as finishes on Monday. 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 She has been on Augmentin with some improvement in the surrounding erythema although the small wound on the tip and the plantar aspect still probe easily to bone and there does not appear to be any viable tissue. UNFORTUNATELY arrives in clinic today with new areas on the right lateral foot neither 1  of these look particularly healthy. No debridement. The patient has an appoint with Dr. Donzetta Matters this morning my question is angiography of the right lego. I also wonder about an infectious disease appointment and I will continue her Augmentin but refer her for consideration. I did not get a culture that was positive even from a bone scraping however. 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 She saw her retina doctor Dr. Deloria Lair and her interpretation of this was that the hyperbarics may have contributed to a blockage. I wonder whether this was a central retinal vein or artery occlusion. I am not sure that Dr. Zadie Rhine documents in epic I will need to see if I can find his note 1/25; this is Hamme returns to clinic having a very eventful week. Firstly she underwent angiography by Dr. Stanford Breed of vein and vascular.. Her preintervention ABI was 0. She underwent a right iliofemoral drug-coated balloon angioplasty, right popliteal drug-coated balloon angioplasty and a right tibial peroneal trunk and peroneal angioplasty by Dr. Stanford Breed. She tolerated this well. She definitely has better blood flow to the right foot. She saw Dr. Doren Custard in follow-up who felt her Doppler flow in the right foot was good. An x-ray did not show any evidence of osteomyelitis however I noted a previous MRI certainly did. She also saw  infectious diseases been started on IV daptomycin and ertapenem. She had the PICC line placed. I had started her on Augmentin on February 3 and that had some improvement in the erythema and swelling over the remanent of her first metatarsal head. As far as I am aware she did not have a specific organism cultured at least not by me although I did try. Everything on the left is closed including  the left transmit amputation site and the area on her left posterior heel/Achilles. We are using silver collagen on the right foot now which has 2 open wounds on the right first metatarsal head and the right fifth metatarsal head 2/1; the patient's left foot transmit site and the area on her heel remain closed. Some eschar but I do not think this requires debridement She has areas on the right first and right fifth met heads underlying osteomyelitis she is on IV daptomycin and ertapenem. The areas are small but probe to bone. 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 Her right transmetatarsal amputation site appears less erythematous and less swollen however the wound on the first met head probes to bone. The bone itself does not feel that vibrant. The wounds on the fifth metatarsal head also on the right have eschar on the surface I did not consider debriding this today. 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 Dr. 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 We will therefore pursue hyperbaric oxygen therapy and treatment of the underlying refractory osteomyelitis 2/15; the patient's right foot actually looks a lot better 2 areas over the fifth met head are closed. Still with the area on the first met head remanent from her TMA is  open to bone. She is on IV antibiotics and has been approved for hyperbarics. In the meantime the whole right first met head remanent looks a lot better with antibiotics less swelling and less erythema but still with the open area probing to bone Objective Constitutional Patient is hypertensive.. Pulse regular and within target range for patient.Marland Kitchen Respirations regular, non-labored and within target range.. Temperature is normal and within the target range for the patient.Marland Kitchen Appears in no distress. Vitals Time Taken: 12:49 PM, Height: 64 in, Weight: 135 lbs, BMI: 23.2, Temperature: 97.7 F, Pulse: 74 bpm, Respiratory Rate: 17 breaths/min, Blood Pressure: 184/74 mmHg, Capillary Blood Glucose: 102 mg/dl. General Notes: Wound exam; right first metatarsal head less swelling less erythema still with a wound probing to bone Integumentary (Hair, Skin) Wound #15 status is Open. Original cause of wound was Other Lesion. The date acquired was: 09/15/2021. The wound has been in treatment 7 weeks. 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 The wound measures 0.3cm length x 1cm width x 0.2cm depth; 0.236cm^2 area and 0.047cm^3 volume. There is Fat Layer (Subcutaneous Tissue) exposed. There is no tunneling or undermining  noted. There is a medium amount of serosanguineous drainage noted. The wound margin is distinct with the outline attached to the wound base. There is large (67-100%) pale granulation within the wound bed. There is a small (1-33%) amount of necrotic tissue within the wound bed including Adherent Slough. Wound #17 status is Healed - Epithelialized. Original cause of wound was Not Known. The date acquired was: 10/10/2021. The wound has been in treatment 4 weeks. The wound is located on the Right,Lateral Foot. The wound measures 0cm length x 0cm width x 0cm depth; 0cm^2 area and 0cm^3 volume. There is Fat Layer (Subcutaneous Tissue) exposed. There is no tunneling noted. There  is a medium amount of serosanguineous drainage noted. The wound margin is flat and intact. There is large (67-100%) pink granulation within the wound bed. There is no necrotic tissue within the wound bed. 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 - with Dr. Dellia Nims room 2 - 1230 prior to HBO Bathing/ Shower/ Hygiene: May shower with protection but do not get wound dressing(s) wet. Edema Control - Lymphedema / SCD / Other: Elevate legs to the level of the heart or above for 30 minutes daily and/or when sitting, a frequency of: - throughout the day Avoid standing for long periods of time. Home Health: No change in wound care orders this week; continue Home Health for wound care. May utilize formulary equivalent dressing for wound treatment orders unless otherwise specified. Dressing changes to be completed by [facility] on Monday / Wednesday / Friday except when patient has scheduled visit at [facility]. Other Home Health Orders/Instructions: - [facility] Hyperbaric Oxygen Therapy: Evaluate for HBO Therapy Indication: - Wagner 3 diabetic ulcer of right foot 2.0 ATA for 90 Minutes without Air Breaks T Number of Treatments: - 30 otal One treatments per day (delivered Monday through Friday unless otherwise specified in Special Instructions below): Finger stick Blood Glucose Pre- and Post- HBOT Treatment. 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 Cleanser: Wound Cleanser [facility]) Every Other Day/30 Days Discharge Instructions: Cleanse the wound with wound cleanser prior to applying a clean dressing using gauze sponges, not tissue or cotton balls. Prim Dressing: Promogran Prisma Matrix, 4.34 (sq in) (silver collagen) (Home Health) Every Other Day/30 Days ary Discharge Instructions: Moisten collagen with [place] Jelly Secondary Dressing: Woven Gauze Sponges 2x2 in [facility]) Every Other Day/30 Days Discharge Instructions: Apply over primary dressing as directed. Secured With: [employer], 4.5x3.1 (in/yd) [facility]) Every Other Day/30 Days Discharge Instructions: Secure with Kerlix as directed. Secured With: [employer] T ape, 2x10 (in/yd) (Home Health) Every Other Day/30 Days Discharge Instructions: Secure dressing with tape as directed. 1. The patient is doing well. 2. Begins HBO today. 3. Continue silver collagen on the wound 4. Still on IV antibiotics as directed by infectious disease Electronic Signature(s) Signed: 11/08/2021 4:36:32 PM  By: [name] MD Entered By: [name] on 11/08/2021 13:29:59 -------------------------------------------------------------------------------- SuperBill Details Patient Name: Date of Service: [name], [name] 11/08/2021 Medical Record Number: [id number] Patient Account Number: [id number] Date of Birth/Sex: Treating RN: 28-Mar-1941 (81 y.o. [name] Primary Care Provider: [name] Other Clinician: Referring Provider: Treating Provider/[name]: Tressie Stalker in Treatment: 20 Diagnosis Coding ICD-10 Codes Code Description [phone] Type 2 diabetes mellitus with foot ulcer T81.31XD Disruption of  external operation (surgical) wound, not elsewhere classified, subsequent encounter L97.514 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, right ankle and foot Facility Procedures CPT4 Code: [id number] Description: 00867 - WOUND CARE VISIT-LEV 3 EST PT Modifier: Quantity: 1 Electronic Signature(s) Signed: 11/08/2021 4:36:32 PM By: [name] MD Entered By: [name] on 11/08/2021 13:30:14

## 2021-11-09 ENCOUNTER — Encounter (HOSPITAL_BASED_OUTPATIENT_CLINIC_OR_DEPARTMENT_OTHER): Payer: Medicare Other | Admitting: Internal Medicine

## 2021-11-09 DIAGNOSIS — L97428 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: Secondary | ICD-10-CM | POA: Diagnosis not present

## 2021-11-09 DIAGNOSIS — E11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: Secondary | ICD-10-CM | POA: Diagnosis not present

## 2021-11-09 DIAGNOSIS — I10 Essential (primary) hypertension: Secondary | ICD-10-CM | POA: Diagnosis not present

## 2021-11-09 DIAGNOSIS — E11621 Type 2 diabetes mellitus with foot ulcer: Secondary | ICD-10-CM | POA: Diagnosis not present

## 2021-11-09 DIAGNOSIS — E11628 Type 2 diabetes mellitus with other skin complications: Secondary | ICD-10-CM | POA: Diagnosis not present

## 2021-11-09 DIAGNOSIS — L97512 Non-pressure chronic ulcer of other part of right foot with fat layer exposed: Secondary | ICD-10-CM | POA: Diagnosis not present

## 2021-11-09 DIAGNOSIS — M869 Osteomyelitis, unspecified: Secondary | ICD-10-CM | POA: Diagnosis not present

## 2021-11-09 DIAGNOSIS — L97528 Non-pressure chronic ulcer of other part of left foot with other specified severity: Secondary | ICD-10-CM | POA: Diagnosis not present

## 2021-11-09 DIAGNOSIS — Z4781 Encounter for orthopedic aftercare following surgical amputation: Secondary | ICD-10-CM | POA: Diagnosis not present

## 2021-11-09 DIAGNOSIS — L97514 Non-pressure chronic ulcer of other part of right foot with necrosis of bone: Secondary | ICD-10-CM | POA: Diagnosis not present

## 2021-11-09 DIAGNOSIS — M86671 Other chronic osteomyelitis, right ankle and foot: Secondary | ICD-10-CM | POA: Diagnosis not present

## 2021-11-09 DIAGNOSIS — D638 Anemia in other chronic diseases classified elsewhere: Secondary | ICD-10-CM | POA: Diagnosis not present

## 2021-11-09 DIAGNOSIS — L89620 Pressure ulcer of left heel, unstageable: Secondary | ICD-10-CM | POA: Diagnosis not present

## 2021-11-09 DIAGNOSIS — I70202 Unspecified atherosclerosis of native arteries of extremities, left leg: Secondary | ICD-10-CM | POA: Diagnosis not present

## 2021-11-09 LAB — GLUCOSE, CAPILLARY
Glucose-Capillary: 74 mg/dL (ref 70–99)
Glucose-Capillary: 234 mg/dL — ABNORMAL HIGH (ref 70–99)
Glucose-Capillary: 142 mg/dL — ABNORMAL HIGH (ref 70–99)

## 2021-11-09 NOTE — Progress Notes (Signed)
[name] ([id number]) Visit Report for 11/09/2021 Arrival Information Details Patient Name: Date of Service: [name] 11/09/2021 1:00 PM Medical Record Number: [id number] Patient Account Number: [id number] Date of Birth/Sex: Treating RN: May 16, 1941 (81 y.o. [name], [name] Primary Care Muntaha Vermette: [name] Other Clinician: [name] Referring Emmalynn Pinkham: Treating Carsten Carstarphen/[name]: Tressie Stalker in Treatment: 21 Visit Information History Since Last Visit All ordered tests and consults were completed: Yes Patient Arrived: Ambulatory Added or deleted any medications: No Arrival Time: 12:42 Any new allergies or adverse reactions: No Accompanied By: self Had a fall or experienced change in No Transfer Assistance: None activities of daily living that may affect Patient Identification Verified: Yes risk of falls: Secondary Verification Process Completed: Yes Signs or symptoms of abuse/neglect since last visito No Patient Requires Transmission-Based Precautions: No Hospitalized since last visit: No Patient Has Alerts: Yes Implantable device outside of the clinic excluding No Patient Alerts: Patient on Blood Thinner cellular tissue based products placed in the center L ABI: 0.82 since last visit: R ABI: 0.47 Pain Present Now: No Electronic Signature(s) Signed: 11/09/2021 4:32:54 PM By: [name] CHT EMT BS , , Entered By: [name] on 11/09/2021 14:53:08 -------------------------------------------------------------------------------- Encounter Discharge Information Details Patient Name: Date of Service: [name], [name]. 11/09/2021 1:00 PM Medical Record Number: [id number] Patient Account Number: [id number] Date of Birth/Sex: Treating RN: June 25, 1941 (81 y.o. [name] Primary Care Tryniti Laatsch: [name] Other Clinician: [name] Referring Lucien Budney: Treating Ketrick Matney/[name]: Tressie Stalker in Treatment: 21 Encounter Discharge Information Items Discharge Condition: Stable Ambulatory Status: Ambulatory Discharge Destination: Home Transportation: Private Auto Accompanied By: self Schedule Follow-up Appointment: No Clinical Summary of Care: Electronic Signature(s) Signed: 11/09/2021 4:32:54 PM By: [name] CHT EMT BS , , Entered By: [name] on 11/09/2021 16:02:21 -------------------------------------------------------------------------------- Vitals Details Patient Name: Date of Service: [place]. 11/09/2021 1:00 PM Medical Record Number: [id number] Patient Account Number: [id number] Date of Birth/Sex: Treating RN: 10/22/1940 (81 y.o. [name] Primary Care Bartlett Enke: [name] Other Clinician: [name] Referring Saoirse Legere: Treating Sherlyn Ebbert/[name]: Tressie Stalker in Treatment: 21 Vital Signs Time Taken: 12:47 Temperature (F): 97.7 Height (in): 64 Pulse (bpm): 79 Weight (lbs): 135 Respiratory Rate (breaths/min): 16 Body Mass Index (BMI): 23.2 Blood Pressure (mmHg): 150/62 Capillary Blood Glucose (mg/dl): 234 Reference Range: 80 - 120 mg / dl Electronic Signature(s) Signed: 11/09/2021 4:32:54 PM By: [name] CHT EMT BS , , Entered By: [name] on 11/09/2021 14:53:35

## 2021-11-09 NOTE — Progress Notes (Signed)
[name] ([id number]) Visit Report for 11/08/2021 HBO Details Patient Name: Date of Service: [name] 11/08/2021 1:30 PM Medical Record Number: [id number] Patient Account Number: [id number] Date of Birth/Sex: Treating RN: [birth year] (81 y.o. 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 HBO Treatment Details Treatment Number: 1 Patient Type: Outpatient Chamber Type: Monoplace Chamber Serial #: [id number] Treatment Protocol: 2.0 ATA with 90 minutes oxygen, and no air breaks Treatment Details Compression Rate Down: 1.0 psi / minute De-Compression Rate Up: 1.0 psi / minute Air breaks and breathing Decompress Decompress Compress Tx Pressure Begins Reached periods Begins Ends (leave unused spaces blank) Chamber Pressure (ATA 1 2 ------2 1 ) Clock Time (24 hr) 14:06 14:27 - - - - - - 15:57 16:15 Treatment Length: 129 (minutes) Treatment Segments: 4 Vital Signs Capillary Blood Glucose Reference Range: 80 - 120 mg / dl HBO Diabetic Blood Glucose Intervention Range: <131 mg/dl or >249 mg/dl Type: Time Vitals Blood Pulse: Respiratory Temperature: Capillary Blood Glucose Pulse Action Taken: Pressure: Rate: Glucose (mg/dl): Meter #: Oximetry (%) Taken: Pre 13:40 184/74 74 17 97.7 137 2 Post 16:15 184/71 81 18 98.2 74 2 8 oz Glucerna Shake given, wait 15 minutes Post 16:22 170/70 manual BP Treatment Response Treatment Toleration: Well Treatment Completion Status:  Treatment Completed without Adverse Event Treatment Notes Chamber was compressed at a rate of 1.0 psi/min. Patient did express that she was comfortable with a faster rate next time. Chamber was decompressed at an average rate of 1.2 psi/min. Patient tolerated well. Post treatment:Patient drank an 8 oz Glucerna 1619. After 15 minutes (1634) she was asymptomatic for hypoglycemia at which time she was discharged. Additional Procedure Documentation Tissue Sevierity: Fat layer exposed [name] Notes First treatment for osteomyelitis of the right first met head. She tolerated the treatment well. Physical exam showed normal tympanic membranes. Respiratory and cardiac exams were clear Physician HBO Attestation: I certify that I supervised this HBO treatment in accordance with Medicare guidelines. A trained emergency response team is readily available per Yes hospital policies and procedures. Continue HBOT as ordered. Yes Electronic Signature(s) Signed: 11/09/2021 4:32:54 PM By: [name] CHT EMT BS , , Signed: 11/09/2021 5:55:17 PM By: [name] MD Previous Signature: 11/08/2021 4:36:32 PM Version By: [name] MD Entered By: [name] on 11/09/2021 16:07:36 -------------------------------------------------------------------------------- HBO Safety Checklist Details Patient Name: Date of Service: [name], [name]. 11/08/2021 1:30 PM Medical Record Number: [id number] Patient Account Number: [id number] Date of Birth/Sex: Treating RN: 1940-11-28 (81 y.o. [name] Primary Care Jomayra Novitsky: [name] Other Clinician: [name] Referring Ondine Gemme: Treating Kimble Delaurentis/[name]: Tressie Stalker in Treatment: 20 HBO Safety Checklist Items Safety Checklist Consent Form Signed Patient voided / foley secured and emptied When did you last eato 1215 Last dose of injectable or oral agent 0815 Ostomy pouch emptied and vented if  applicable NA All implantable devices assessed, documented and approved NA Intravenous access site secured and place Right Forearm PICC Valuables secured Linens and cotton and cotton/polyester blend (less than 51% polyester) Personal oil-based products / skin lotions / body lotions removed  Wigs or hairpieces removed Patient removed. Smoking or tobacco materials removed NA Books / newspapers / magazines / loose paper removed Cologne, aftershave, perfume and deodorant removed Jewelry removed (may wrap wedding band) Make-up removed Hair care products removed Battery operated devices (external) removed Heating patches and chemical warmers removed Titanium eyewear removed Eyewear removed today. Nail polish cured greater than 10 hours Patient states nails are done Saturdays. Casting material cured greater than 10 hours NA Hearing aids removed NA Loose dentures or partials removed NA Prosthetics have been removed NA Patient demonstrates correct use of air break device (if applicable) Patient concerns have been addressed Patient grounding bracelet on and cord attached to chamber Specifics for Inpatients (complete in addition to above) Medication sheet sent with patient NA Intravenous medications needed or due during therapy sent with patient NA Drainage tubes (e.g. nasogastric tube or chest tube secured and vented) NA Endotracheal or Tracheotomy tube secured NA Cuff deflated of air and inflated with saline NA Airway suctioned NA Notes Paper version used prior to treatment start. Electronic Signature(s) Signed: 11/09/2021 4:32:54 PM By: [name] CHT EMT BS , , Entered By: [name] on 11/08/2021 14:48:05

## 2021-11-09 NOTE — Progress Notes (Signed)
[name] ([id number]) Visit Report for 11/08/2021 Arrival Information Details Patient Name: Date of Service: [name] 11/08/2021 12:30 PM Medical Record Number: [id number] Patient Account Number: [id number] Date of Birth/Sex: Treating RN: 1940/12/24 (81 y.o. [name], [name] Primary Care Mujahid Jalomo: [name] Other Clinician: Referring Korrine Sicard: Treating Ranell Skibinski/[name]: Tressie Stalker in Treatment: 69 Visit Information History Since Last Visit Added or deleted any medications: No Patient Arrived: Ambulatory Any new allergies or adverse reactions: No Arrival Time: 12:49 Had a fall or experienced change in No Accompanied By: self activities of daily living that may affect Transfer Assistance: None risk of falls: Patient Identification Verified: Yes Signs or symptoms of abuse/neglect since last visito No Secondary Verification Process Completed: Yes Hospitalized since last visit: No Patient Requires Transmission-Based Precautions: No Implantable device outside of the clinic excluding No Patient Has Alerts: Yes cellular tissue based products placed in the center Patient Alerts: Patient on Blood Thinner since last visit: L ABI: 0.82 Has Dressing in Place as Prescribed: Yes R ABI: 0.47 Pain Present Now: No Electronic Signature(s) Signed: 11/09/2021 5:08:54 PM By: Rhae Hammock RN Entered By: Rhae Hammock on 11/08/2021 12:49:48 -------------------------------------------------------------------------------- Clinic Level of Care Assessment Details Patient Name: Date of Service: [name] 11/08/2021 12:30 PM Medical Record Number: [id number] Patient Account Number: [id number] Date of Birth/Sex: Treating RN: 1940-09-27 (81 y.o. [name] Primary Care Murial Beam: [name] Other Clinician: Referring Armoni Kludt: Treating Deonna Krummel/[name]: Tressie Stalker in Treatment:  20 Clinic Level of Care Assessment Items TOOL 4 Quantity Score []  - 0 Use when only an EandM is performed on FOLLOW-UP visit ASSESSMENTS - Nursing Assessment / Reassessment X- 1 10 Reassessment of Co-morbidities (includes updates in patient status) X- 1 5 Reassessment of Adherence to Treatment Plan ASSESSMENTS - Wound and Skin A ssessment / Reassessment X - Simple Wound Assessment / Reassessment - one wound 1 5 []  - 0 Complex Wound Assessment / Reassessment - multiple wounds []  - 0 Dermatologic / Skin Assessment (not related to wound area) ASSESSMENTS - Focused Assessment X- 1 5 Circumferential Edema Measurements - multi extremities []  - 0 Nutritional Assessment / Counseling / Intervention X- 1 5 Lower Extremity Assessment (monofilament, tuning fork, pulses) []  - 0 Peripheral Arterial Disease Assessment (using hand held doppler) ASSESSMENTS - Ostomy and/or Continence Assessment and Care []  - 0 Incontinence Assessment and Management []  - 0 Ostomy Care Assessment and Management (repouching, etc.) PROCESS - Coordination of Care X - Simple Patient / Family Education for ongoing care 1 15 []  - 0 Complex (extensive) Patient / Family Education for ongoing care X- 1 10 Staff obtains Programmer, systems, Records, T Results / Process Orders est X- 1 10 Staff telephones HHA, Nursing Homes / Clarify orders / etc []  - 0 Routine Transfer to another Facility (non-emergent condition) []  - 0 Routine Hospital Admission (non-emergent condition) []  - 0 New Admissions / Biomedical engineer / Ordering NPWT Apligraf, etc. , []  - 0 Emergency Hospital Admission (emergent condition) X- 1 10 Simple Discharge Coordination []  - 0 Complex (extensive) Discharge Coordination PROCESS - Special Needs []  - 0 Pediatric / Minor Patient Management []  - 0 Isolation Patient Management []  - 0 Hearing / Language / Visual special needs []  - 0 Assessment of Community assistance (transportation, D/C  planning, etc.) []  - 0 Additional assistance / Altered mentation []  - 0 Support Surface(s) Assessment (bed, cushion, seat, etc.) INTERVENTIONS - Wound Cleansing / Measurement X - Simple Wound Cleansing -  one wound 1 5 []  - 0 Complex Wound Cleansing - multiple wounds X- 1 5 Wound Imaging (photographs - any number of wounds) []  - 0 Wound Tracing (instead of photographs) X- 1 5 Simple Wound Measurement - one wound []  - 0 Complex Wound Measurement - multiple wounds INTERVENTIONS - Wound Dressings X - Small Wound Dressing one or multiple wounds 1 10 []  - 0 Medium Wound Dressing one or multiple wounds []  - 0 Large Wound Dressing one or multiple wounds []  - 0 Application of Medications - topical []  - 0 Application of Medications - injection INTERVENTIONS - Miscellaneous []  - 0 External ear exam []  - 0 Specimen Collection (cultures, biopsies, blood, body fluids, etc.) []  - 0 Specimen(s) / Culture(s) sent or taken to Lab for analysis []  - 0 Patient Transfer (multiple staff / Civil Service fast streamer / Similar devices) []  - 0 Simple Staple / Suture removal (25 or less) []  - 0 Complex Staple / Suture removal (26 or more) []  - 0 Hypo / Hyperglycemic Management (close monitor of Blood Glucose) []  - 0 Ankle / Brachial Index (ABI) - do not check if billed separately X- 1 5 Vital Signs Has the patient been seen at the hospital within the last three years: Yes Total Score: 105 Level Of Care: New/Established - Level 3 Electronic Signature(s) Signed: 11/08/2021 5:14:15 PM By: Baruch Gouty RN, BSN Entered By: Baruch Gouty on 11/08/2021 13:07:04 -------------------------------------------------------------------------------- Encounter Discharge Information Details Patient Name: Date of Service: [name], [name] Calamity LLY H. 11/08/2021 12:30 PM Medical Record Number: [id number] Patient Account Number: [id number] Date of Birth/Sex: Treating RN: 1941/08/24 (81 y.o. [name] Primary Care  Nardos Putnam: [name] Other Clinician: Referring Cynda Soule: Treating Marlon Vonruden/[name]: Tressie Stalker in Treatment: 20 Encounter Discharge Information Items Discharge Condition: Stable Ambulatory Status: Cane Discharge Destination: Home Transportation: Private Auto Accompanied By: self Schedule Follow-up Appointment: Yes Clinical Summary of Care: Patient Declined Electronic Signature(s) Signed: 11/08/2021 5:14:15 PM By: Baruch Gouty RN, BSN Entered By: Baruch Gouty on 11/08/2021 13:14:45 -------------------------------------------------------------------------------- Lower Extremity Assessment Details Patient Name: Date of Service: [name] 11/08/2021 12:30 PM Medical Record Number: [id number] Patient Account Number: [id number] Date of Birth/Sex: Treating RN: 22-Apr-1941 (81 y.o. [name], [name] Primary Care Janisha Bueso: [name] Other Clinician: Referring Soliyana Mcchristian: Treating Stiles Maxcy/[name]: Tressie Stalker in Treatment: 20 Edema Assessment Assessed: Shirlyn Goltz: No] Patrice Paradise: Yes] Edema: [Left: No] [Right: Yes] Calf Left: Right: Point of Measurement: From Medial Instep 30 cm Ankle Left: Right: Point of Measurement: From Medial Instep 23 cm Vascular Assessment Pulses: Dorsalis Pedis Palpable: [Right:Yes] Posterior Tibial Palpable: [Right:Yes] Electronic Signature(s) Signed: 11/09/2021 5:08:54 PM By: Rhae Hammock RN Entered By: Rhae Hammock on 11/08/2021 12:55:53 -------------------------------------------------------------------------------- Multi Wound Chart Details Patient Name: Date of Service: [name], [name] 11/08/2021 12:30 PM Medical Record Number: [id number] Patient Account Number: [id number] Date of Birth/Sex: Treating RN: October 13, 1940 (81 y.o. [name] Primary Care Korri Ask: [name] Other Clinician: Referring Breyden Jeudy: Treating Gwenn Teodoro/[name]:  Tressie Stalker in Treatment: 20 Vital Signs Height(in): 64 Capillary Blood Glucose(mg/dl): 102 Weight(lbs): 135 Pulse(bpm): 49 Body Mass Index(BMI): 23.2 Blood Pressure(mmHg): 184/74 Temperature(F): 97.7 Respiratory Rate(breaths/min): 17 Photos: [17:No Photos] [N/A:N/A] Right Amputation Site - Right, Lateral Foot N/A Wound Location: Transmetatarsal Other Lesion Not Known N/A Wounding Event: Diabetic Wound/Ulcer of the Lower Abrasion N/A Primary Etiology: Extremity Coronary Artery Disease, Deep Vein Coronary Artery Disease, Deep Vein N/A Comorbid History: Thrombosis, Hypertension, Myocardial Thrombosis, Hypertension, Myocardial Infarction, Peripheral Arterial  Disease, Infarction, Peripheral Arterial Disease, Type II Diabetes Type II Diabetes 09/15/2021 10/10/2021 N/A Date Acquired: 7 4 N/A Weeks of Treatment: Open Healed - Epithelialized N/A Wound Status: No No N/A Wound Recurrence: 0.3x1x0.2 0x0x0 N/A Measurements L x W x D (cm) 0.236 0 N/A A (cm) : rea 0.047 0 N/A Volume (cm) : 39.90% 100.00% N/A % Reduction in Area: 60.20% 100.00% N/A % Reduction in Volume: Grade 3 Full Thickness Without Exposed N/A Classification: Support Structures Medium Medium N/A Exudate Amount: Serosanguineous Serosanguineous N/A Exudate Type: red, brown red, brown N/A Exudate Color: Distinct, outline attached Flat and Intact N/A Wound Margin: Large (67-100%) Large (67-100%) N/A Granulation Amount: Pale Pink N/A Granulation Quality: Small (1-33%) None Present (0%) N/A Necrotic Amount: Fat Layer (Subcutaneous Tissue): Yes Fat Layer (Subcutaneous Tissue): Yes N/A Exposed Structures: Fascia: No Fascia: No Tendon: No Tendon: No Muscle: No Muscle: No Joint: No Joint: No Bone: No Bone: No Small (1-33%) Medium (34-66%) N/A Epithelialization: Treatment Notes Wound #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 Wound Cleanser Discharge Instruction: Cleanse the wound with wound cleanser prior to applying a clean dressing using gauze sponges, not tissue or cotton balls. Peri-Wound Care Topical Primary Dressing Promogran Prisma Matrix, 4.34 (sq in) (silver collagen) Discharge Instruction: Moisten collagen with [place] Jelly Secondary Dressing Woven Gauze Sponges 2x2 in Discharge Instruction: Apply over primary dressing as directed. Secured With [employer], 4.5x3.1 (in/yd) Discharge Instruction: Secure with Kerlix as directed. Transpore Surgical Tape, 2x10 (in/yd) Discharge Instruction: Secure dressing with tape as directed. Compression Wrap Compression Stockings Add-Ons Wound #17 (Foot) Wound Laterality: Right, Lateral Cleanser Peri-Wound Care Topical Primary Dressing Secondary Dressing Secured With Compression Wrap Compression Stockings Add-Ons Electronic Signature(s) Signed: 11/08/2021 4:36:32 PM By: [name] MD Signed: 11/09/2021 6:28:13 PM By: [name] RN, BSN Entered By: [name] on 11/08/2021 13:25:55 -------------------------------------------------------------------------------- Multi-Disciplinary Care Plan Details Patient Name: Date of Service: [name], [name] 11/08/2021 12:30 PM Medical Record Number: [id number] Patient Account Number: [id number] Date of Birth/Sex: Treating RN: May 30, 1941 (81 y.o. [name] Primary Care Irelynd Zumstein: [name] Other Clinician: Referring Hyacinth Marcelli: Treating Kayli Beal/[name]: Tressie Stalker in Treatment: Lawrence reviewed with physician Active Inactive Nutrition Nursing Diagnoses: Impaired glucose control: actual or potential Potential for alteratiion in Nutrition/Potential for imbalanced nutrition Goals: Patient/caregiver  agrees to and verbalizes understanding of need to use nutritional supplements and/or vitamins as prescribed Date Initiated: 06/15/2021 Date Inactivated: 09/20/2021 Target Resolution Date: 09/23/2021 Goal Status: Met Patient/caregiver will maintain therapeutic glucose control Date Initiated: 06/15/2021 Target Resolution Date: 11/17/2021 Goal Status: Active Interventions: Assess HgA1c results as ordered upon admission and as needed Assess patient nutrition upon admission and as needed per policy Provide education on elevated blood sugars and impact on wound healing Provide education on nutrition Treatment Activities: Education provided on Nutrition : 06/15/2021 Notes: 09/20/21: Glucose control ongoing. Wound/Skin Impairment Nursing Diagnoses: Impaired tissue integrity Knowledge deficit related to ulceration/compromised skin integrity Goals: Patient/caregiver will verbalize understanding of skin care regimen Date Initiated: 06/15/2021 Target Resolution Date: 11/17/2021 Goal Status: Active Interventions: Assess patient/caregiver ability to obtain necessary supplies Assess patient/caregiver ability to perform ulcer/skin care regimen upon admission and as needed Assess ulceration(s) every visit Provide education on ulcer and skin care Notes: 09/20/21: Wound care regimen continues Electronic Signature(s) Signed: 11/08/2021 5:14:15 PM By: Baruch Gouty RN, BSN Entered By: Johna Roles,  [name] on 11/08/2021 12:59:38 -------------------------------------------------------------------------------- Pain Assessment Details Patient Name: Date of Service: [name] 11/08/2021 12:30 PM Medical Record Number: [id number] Patient Account Number: [id number] Date of Birth/Sex: Treating RN: 04-12-1941 (81 y.o. [name], [name] Primary Care Jamarii Banks: [name] Other Clinician: Referring Shealeigh Dunstan: Treating Ginette Bradway/[name]: Tressie Stalker in Treatment:  20 Active Problems Location of Pain Severity and Description of Pain Patient Has Paino No Site Locations Pain Management and Medication Current Pain Management: Electronic Signature(s) Signed: 11/09/2021 5:08:54 PM By: Rhae Hammock RN Entered By: Rhae Hammock on 11/08/2021 12:51:02 -------------------------------------------------------------------------------- Patient/Caregiver Education Details Patient Name: Date of Service: [name] 2/15/2023andnbsp12:30 PM Medical Record Number: [id number] Patient Account Number: [id number] Date of Birth/Gender: Treating RN: [birth year] (81 y.o. [name] Primary Care Physician: [name] Other Clinician: Referring Physician: Treating Physician/[name]: Tressie Stalker in Treatment: 20 Education Assessment Education Provided To: Patient Education Topics Provided Infection: Methods: Explain/Verbal Responses: Reinforcements needed, State content correctly Wound/Skin Impairment: Methods: Explain/Verbal Responses: Reinforcements needed, State content correctly Electronic Signature(s) Signed: 11/08/2021 5:14:15 PM By: Baruch Gouty RN, BSN Entered By: Baruch Gouty on 11/08/2021 13:00:06 -------------------------------------------------------------------------------- Wound Assessment Details Patient Name: Date of Service: [name], [name] 11/08/2021 12:30 PM Medical Record Number: [id number] Patient Account Number: [id number] Date of Birth/Sex: Treating RN: 1940-12-28 (81 y.o. [name], [name] Primary Care Pebbles Zeiders: [name] Other Clinician: Referring Manoj Enriquez: Treating Tong Pieczynski/[name]: Tressie Stalker in Treatment: 20 Wound Status Wound Number: 15 Primary Diabetic Wound/Ulcer of the Lower Extremity Etiology: Wound Location: Right Amputation Site - Transmetatarsal Wound Open Wounding Event: Other Lesion Status: Date  Acquired: 09/15/2021 Comorbid Coronary Artery Disease, Deep Vein Thrombosis, Hypertension, Weeks Of Treatment: 7 History: Myocardial Infarction, Peripheral Arterial Disease, Type II Diabetes Clustered Wound: No Photos Wound Measurements Length: (cm) 0.3 Width: (cm) 1 Depth: (cm) 0.2 Area: (cm) 0.236 Volume: (cm) 0.047 % Reduction in Area: 39.9% % Reduction in Volume: 60.2% Epithelialization: Small (1-33%) Tunneling: No Undermining: No Wound Description Classification: Grade 3 Wound Margin: Distinct, outline attached Exudate Amount: Medium Exudate Type: Serosanguineous Exudate Color: red, brown Foul Odor After Cleansing: No Slough/Fibrino Yes Wound Bed Granulation Amount: Large (67-100%) Exposed Structure Granulation Quality: Pale Fascia Exposed: No Necrotic Amount: Small (1-33%) Fat Layer (Subcutaneous Tissue) Exposed: Yes Necrotic Quality: Adherent Slough Tendon Exposed: No Muscle Exposed: No Joint Exposed: No Bone Exposed: No Treatment Notes 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 Wound Cleanser Discharge Instruction: Cleanse the wound with wound cleanser prior to applying a clean dressing using gauze sponges, not tissue or cotton balls. Peri-Wound Care Topical Primary Dressing Promogran Prisma Matrix, 4.34 (sq in) (silver collagen) Discharge Instruction: Moisten collagen with [place] Jelly Secondary Dressing Woven Gauze Sponges 2x2 in Discharge Instruction: Apply over primary dressing as directed. Secured With [employer], 4.5x3.1 (in/yd) Discharge Instruction: Secure with Kerlix as directed. Transpore Surgical Tape, 2x10 (in/yd) Discharge Instruction: Secure dressing with tape as directed. Compression Wrap Compression Stockings Add-Ons Electronic Signature(s) Signed: 11/09/2021  5:08:54 PM By: Rhae Hammock RN Entered By: Rhae Hammock on 11/08/2021 12:59:20 -------------------------------------------------------------------------------- Wound Assessment Details Patient Name: Date of Service: [name], [name] 11/08/2021 12:30 PM Medical Record Number: [id number] Patient Account Number: [id number] Date of Birth/Sex: Treating RN: [birth year] (81 y.o. [name], [name] Primary Care Yazaira Speas: [name] Other Clinician: Referring  Carmack: Treating Luxe Cuadros/[name]:  Marcy Panning Weeks in Treatment: 20 Wound Status Wound Number: 17 Primary Abrasion Etiology: Wound Location: Right, Lateral Foot Wound Healed - Epithelialized Wounding Event: Not Known Status: Date Acquired: 10/10/2021 Comorbid Coronary Artery Disease, Deep Vein Thrombosis, Hypertension, Weeks Of Treatment: 4 History: Myocardial Infarction, Peripheral Arterial Disease, Type II Diabetes Clustered Wound: No Wound Measurements Length: (cm) Width: (cm) Depth: (cm) Area: (cm) Volume: (cm) 0 % Reduction in Area: 100% 0 % Reduction in Volume: 100% 0 Epithelialization: Medium (34-66%) 0 Tunneling: No 0 Wound Description Classification: Full Thickness Without Exposed Support Structures Wound Margin: Flat and Intact Exudate Amount: Medium Exudate Type: Serosanguineous Exudate Color: red, brown Foul Odor After Cleansing: No Slough/Fibrino No Wound Bed Granulation Amount: Large (67-100%) Exposed Structure Granulation Quality: Pink Fascia Exposed: No Necrotic Amount: None Present (0%) Fat Layer (Subcutaneous Tissue) Exposed: Yes Tendon Exposed: No Muscle Exposed: No Joint Exposed: No Bone Exposed: No Treatment Notes Wound #17 (Foot) Wound Laterality: Right, Lateral Cleanser Peri-Wound Care Topical Primary Dressing Secondary Dressing Secured With Compression Wrap Compression Stockings Add-Ons Electronic Signature(s) Signed: 11/09/2021  5:08:54 PM By: Rhae Hammock RN Entered By: Rhae Hammock on 11/08/2021 12:58:08 -------------------------------------------------------------------------------- Vitals Details Patient Name: Date of Service: [name], [name] 11/08/2021 12:30 PM Medical Record Number: [id number] Patient Account Number: [id number] Date of Birth/Sex: Treating RN: 05/17/1941 (81 y.o. [name], [name] Primary Care Trennon Torbeck: [name] Other Clinician: Referring Kenlee Vogt: Treating Elridge Stemm/[name]: Tressie Stalker in Treatment: 20 Vital Signs Time Taken: 12:49 Temperature (F): 97.7 Height (in): 64 Pulse (bpm): 74 Weight (lbs): 135 Respiratory Rate (breaths/min): 17 Body Mass Index (BMI): 23.2 Blood Pressure (mmHg): 184/74 Capillary Blood Glucose (mg/dl): 102 Reference Range: 80 - 120 mg / dl Electronic Signature(s) Signed: 11/09/2021 5:08:54 PM By: Rhae Hammock RN Entered By: Rhae Hammock on 11/08/2021 12:50:41

## 2021-11-09 NOTE — Progress Notes (Addendum)
[name], [name] ([id number]) Visit Report for 11/08/2021 SuperBill Details Patient Name: Date of Service: [name] 11/08/2021 Medical Record Number: [id number] Patient Account Number: [id number] Date of Birth/Sex: Treating RN: 29-Mar-1941 (81 y.o. 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, right ankle and foot Facility Procedures CPT4 Code Description Modifier Quantity [id number] G0277-(Facility Use Only) HBOT full body chamber, 29min , 4 ICD-10 Diagnosis Description 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, right ankle and foot Physician Procedures Quantity CPT4 Code Description Modifier [id number] - WC PHYS HYPERBARIC OXYGEN THERAPY 1 ICD-10 Diagnosis Description 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

## 2021-11-09 NOTE — Progress Notes (Addendum)
[name], [name] ([id number]) Visit Report for 11/09/2021 SuperBill Details Patient Name: Date of Service: [name] 11/09/2021 Medical Record Number: [id number] Patient Account Number: [id number] Date of Birth/Sex: Treating RN: [birth year] (81 y.o. 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, right ankle and foot Facility Procedures CPT4 Code Description Modifier Quantity [id number] G0277-(Facility Use Only) HBOT full body chamber, 75min , 4 ICD-10 Diagnosis Description 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, right ankle and foot Physician Procedures Quantity CPT4 Code Description Modifier [id number] 21975 - WC PHYS HYPERBARIC OXYGEN THERAPY 1 ICD-10 Diagnosis Description 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

## 2021-11-09 NOTE — Progress Notes (Signed)
NILAM, QUAKENBUSH ([id number]) Visit Report for 11/09/2021 HBO Details Patient Name: Date of Service: [name] 11/09/2021 1:00 PM Medical Record Number: [id number] Patient Account Number: [id number] Date of Birth/Sex: Treating RN: [birth year] (81 y.o. 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 HBO Treatment Details Treatment Number: 2 Patient Type: Outpatient Chamber Type: Monoplace Chamber Serial #: [id number] Treatment Protocol: 2.0 ATA with 90 minutes oxygen, and no air breaks Treatment Details Compression Rate Down: 1.0 psi / minute De-Compression Rate Up: 2.0 psi / minute Air breaks and breathing Decompress Decompress Compress Tx Pressure Begins Reached periods Begins Ends (leave unused spaces blank) Chamber Pressure (ATA 1 2 ------2 1 ) Clock Time (24 hr) 13:20 13:33 - - - - - - 15:03 15:16 Treatment Length: 116 (minutes) Treatment Segments: 4 Vital Signs Capillary Blood Glucose Reference Range: 80 - 120 mg / dl HBO Diabetic Blood Glucose Intervention Range: <131 mg/dl or >249 mg/dl Time Vitals Blood Respiratory Capillary Blood Glucose Pulse Action Type: Pulse: Temperature: Taken: Pressure: Rate: Glucose (mg/dl): Meter #: Oximetry (%) Taken: Pre 12:47 150/62 79 16 97.7 234 2 Post 15:21 182/59 64 16 97.4 142 2 Treatment Response Treatment Toleration: Well Treatment Completion Status: Treatment Completed without Adverse Event Treatment Notes Patient was  placed in the chamber after completing safety checklist. Chamber was pressurized at a rate of 1.0 psi/min until reaching 5 psi. Upon reaching 5 psi, compression rate was increased to 1.5 psi/min. Decompression of the chamber was at a rate of 1.5 psi/min until reaching 5 psi at which time the rate was slowed to 1.0 psi/min. Patient tolerated this travel rate well. Patient departed before re-checking her post-treatment blood pressure as >094 mmHg for systolic pressure is considered high per [facility] protocol. Manual BP was taken yesterday as a secondary measurement and it had lowered to 170/70 within approximately 5 minutes. 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 A trained emergency response team is readily available per Yes hospital policies and procedures. Continue HBOT as ordered. Yes Electronic Signature(s) Signed: 11/09/2021 5:55:17 PM By: [name] MD Previous Signature: 11/09/2021 4:32:54 PM Version By: [name] CHT EMT BS , , Entered By: [name] on 11/09/2021 17:52:10 -------------------------------------------------------------------------------- HBO Safety Checklist Details Patient Name: Date of Service: [name] 11/09/2021 1:00 PM Medical Record Number: [id number] Patient Account Number: [id number] Date of Birth/Sex: Treating RN: 01/19/1941 (81 y.o. [name] Primary Care Catriona Dillenbeck: [name] Other Clinician: [name] Referring Lucia Harm: Treating Arsenia Goracke/[name]: Tressie Stalker in Treatment: 21 HBO Safety Checklist Items Safety Checklist Consent Form Signed Patient voided / foley secured and emptied When did you last eato 1230 Last dose of injectable or oral agent 0830 Ostomy pouch emptied and  vented if applicable NA All implantable devices assessed, documented and approved NA Intravenous access site secured and place Right Forearm PICC Valuables secured Linens and cotton and cotton/polyester blend (less than 51% polyester) Personal oil-based products / skin lotions /  body lotions removed Wigs or hairpieces removed Patient removed item. Smoking or tobacco materials removed NA Books / newspapers / magazines / loose paper removed Cologne, aftershave, perfume and deodorant removed Jewelry removed (may wrap wedding band) Make-up removed Hair care products removed Battery operated devices (external) removed Heating patches and chemical warmers removed Titanium eyewear removed Nail polish cured greater than 10 hours Casting material cured greater than 10 hours NA Hearing aids removed NA Loose dentures or partials removed NA Prosthetics have been removed NA Patient demonstrates correct use of air break device (if applicable) Patient concerns have been addressed Patient grounding bracelet on and cord attached to chamber Specifics for Inpatients (complete in addition to above) Medication sheet sent with patient NA Intravenous medications needed or due during therapy sent with patient NA Drainage tubes (e.g. nasogastric tube or chest tube secured and vented) NA Endotracheal or Tracheotomy tube secured NA Cuff deflated of air and inflated with saline NA Airway suctioned NA Notes Paper version used prior to treatment. Electronic Signature(s) Signed: 11/09/2021 4:32:54 PM By: [name] CHT EMT BS , , Entered By: [name] on 11/09/2021 14:55:23

## 2021-11-09 NOTE — Progress Notes (Signed)
RYEN, HEITMEYER ([id number]) Visit Report for 11/08/2021 Arrival Information Details Patient Name: Date of Service: [name] 11/08/2021 1:30 PM Medical Record Number: [id number] Patient Account Number: [id number] Date of Birth/Sex: Treating RN: 1941/09/23 (81 y.o. [name], [name] Primary Care Vena Bassinger: [name] Other Clinician: [name] Referring Kali Ambler: Treating Khyleigh Furney/[name]: Tressie Stalker in Treatment: 27 Visit Information History Since Last Visit All ordered tests and consults were completed: Yes Patient Arrived: Ambulatory Added or deleted any medications: No Arrival Time: 13:10 Any new allergies or adverse reactions: No Accompanied By: self Had a fall or experienced change in No Transfer Assistance: None activities of daily living that may affect Patient Identification Verified: Yes risk of falls: Secondary Verification Process Completed: Yes Signs or symptoms of abuse/neglect since last visito No Patient Requires Transmission-Based Precautions: No Hospitalized since last visit: No Patient Has Alerts: Yes Implantable device outside of the clinic excluding No Patient Alerts: Patient on Blood Thinner cellular tissue based products placed in the center L ABI: 0.82 since last visit: R ABI: 0.47 Pain Present Now: No Electronic Signature(s) Signed: 11/09/2021 4:32:54 PM By: [name] CHT EMT BS , , Entered By: [name] on 11/08/2021 14:39:54 -------------------------------------------------------------------------------- Vitals Details Patient Name: Date of Service: [name], [name] LLY H. 11/08/2021 1:30 PM Medical Record Number: [id number] Patient Account Number: [id number] Date of Birth/Sex: Treating RN: [birth year] (81 y.o. [name] Primary Care Samari Bittinger: [name] Other Clinician: [name] Referring Markeem Noreen: Treating Naomi Fitton/[name]: Tressie Stalker in Treatment: 20 Vital Signs Time Taken: 13:40 Temperature (F): 97.7 Height (in): 64 Pulse (bpm): 74 Weight (lbs): 135 Respiratory Rate (breaths/min): 17 Body Mass Index (BMI): 23.2 Blood Pressure (mmHg): 184/74 Capillary Blood Glucose (mg/dl): 137 Reference Range: 80 - 120 mg / dl Notes Vitals are from wound care encounter. Blood Glucose measurement was taken at 1340. Electronic Signature(s) Signed: 11/09/2021 4:32:54 PM By: [name] CHT EMT BS , , Entered By: [name] on 11/08/2021 14:45:18

## 2021-11-10 ENCOUNTER — Encounter (HOSPITAL_BASED_OUTPATIENT_CLINIC_OR_DEPARTMENT_OTHER): Payer: Medicare Other | Admitting: Internal Medicine

## 2021-11-10 ENCOUNTER — Other Ambulatory Visit: Payer: Self-pay

## 2021-11-10 DIAGNOSIS — L97528 Non-pressure chronic ulcer of other part of left foot with other specified severity: Secondary | ICD-10-CM | POA: Diagnosis not present

## 2021-11-10 DIAGNOSIS — M86671 Other chronic osteomyelitis, right ankle and foot: Secondary | ICD-10-CM | POA: Diagnosis not present

## 2021-11-10 DIAGNOSIS — E11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: Secondary | ICD-10-CM | POA: Diagnosis not present

## 2021-11-10 DIAGNOSIS — L97514 Non-pressure chronic ulcer of other part of right foot with necrosis of bone: Secondary | ICD-10-CM

## 2021-11-10 DIAGNOSIS — E11621 Type 2 diabetes mellitus with foot ulcer: Secondary | ICD-10-CM

## 2021-11-10 DIAGNOSIS — L97428 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: Secondary | ICD-10-CM | POA: Diagnosis not present

## 2021-11-10 LAB — GLUCOSE, CAPILLARY
Glucose-Capillary: 213 mg/dL — ABNORMAL HIGH (ref 70–99)
Glucose-Capillary: 126 mg/dL — ABNORMAL HIGH (ref 70–99)

## 2021-11-10 NOTE — Progress Notes (Addendum)
SHARLOT, STURKEY ([id number]) Visit Report for 11/10/2021 HBO Details Patient Name: Date of Service: [name] 11/10/2021 10:00 A M Medical Record Number: [id number] Patient Account Number: [id number] Date of Birth/Sex: Treating RN: 01-11-1941 (81 y.o. 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 HBO Treatment Details Treatment Number: 3 Patient Type: Outpatient Chamber Type: Monoplace Chamber Serial #: [id number] Treatment Protocol: 2.0 ATA with 90 minutes oxygen, and no air breaks Treatment Details Compression Rate Down: 1.5 psi / minute De-Compression Rate Up: 1.5 psi / minute Air breaks and breathing Decompress Decompress Compress Tx Pressure Begins Reached periods Begins Ends (leave unused spaces blank) Chamber Pressure (ATA 1 2 ------2 1 ) Clock Time (24 hr) 11:05 11:17 - - - - - - 12:47 12:58 Treatment Length: 113 (minutes) Treatment Segments: 4 Vital Signs Capillary Blood Glucose Reference Range: 80 - 120 mg / dl HBO Diabetic Blood Glucose Intervention Range: <131 mg/dl or >249 mg/dl Time Vitals Blood Respiratory Capillary Blood Glucose Pulse Action Type: Pulse: Temperature: Taken: Pressure: Rate: Glucose (mg/dl): Meter #: Oximetry (%) Taken: Pre 10:30 175/72 78 16 97.7 213 Post 13:00 148/78 68 16 98.2 126 Treatment Response Treatment Toleration: Well Treatment Completion Status: Treatment Completed without Adverse Event Additional Procedure  Documentation 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 A trained emergency response team is readily available per Yes hospital policies and procedures. Continue HBOT as ordered. Yes Electronic Signature(s) Signed: 11/13/2021 8:58:25 AM By: [name] DO Previous Signature: 11/10/2021 1:10:25 PM Version By: [name] EMT Entered By: [name] on 11/13/2021 08:57:18 -------------------------------------------------------------------------------- HBO Safety Checklist Details Patient Name: Date of Service: [name], [name] 11/10/2021 10:00 A M Medical Record Number: [id number] Patient Account Number: [id number] Date of Birth/Sex: Treating RN: 08-24-1941 (81 y.o. [name] Primary Care Zeta Bucy: [name] Other Clinician: [name] Referring Ralpheal Zappone: Treating Madolyn Ackroyd/[name]: Jimmy Footman in Treatment: 21 HBO Safety Checklist Items Safety Checklist Consent Form Signed Patient voided / foley secured and emptied When did you last eato 0800 Last dose of injectable or oral agent 0830 Ostomy pouch emptied and vented if applicable NA All implantable devices assessed, documented and approved NA Intravenous access site secured and place Valuables secured Linens and cotton and cotton/polyester blend (less than 51% polyester) Personal oil-based products / skin lotions / body lotions removed Wigs or hairpieces removed Smoking or tobacco materials removed Books / newspapers / magazines / loose paper removed Cologne, aftershave, perfume and deodorant removed Jewelry removed (may wrap wedding band) Make-up removed Hair care products removed Battery operated devices (external) removed Heating patches and chemical warmers removed Titanium eyewear removed NA Nail polish cured greater than 10 hours Casting  material cured greater than 10 hours NA Hearing aids removed NA Loose dentures or partials removed NA Prosthetics have been removed NA Patient demonstrates correct use of air break device (if applicable) Patient concerns have been addressed Patient grounding bracelet on and cord attached to chamber Specifics for Inpatients (complete in addition to above) Medication sheet sent with  patient NA Intravenous medications needed or due during therapy sent with patient NA Drainage tubes (e.g. nasogastric tube or chest tube secured and vented) NA Endotracheal or Tracheotomy tube secured NA Cuff deflated of air and inflated with saline NA Airway suctioned NA Notes Paper version used prior to treatment start. Electronic Signature(s) Signed: 11/10/2021 1:07:49 PM By: [name] EMT Previous Signature: 11/10/2021 11:55:44 AM Version By: [name] EMT Entered By: [name] on 11/10/2021 13:07:49

## 2021-11-10 NOTE — Progress Notes (Addendum)
[name], [name] ([id number]) Visit Report for 11/10/2021 Arrival Information Details Patient Name: Date of Service: [name] 11/10/2021 10:00 A M Medical Record Number: [id number] Patient Account Number: [id number] Date of Birth/Sex: Treating RN: March 08, 1941 (81 y.o. [name] Primary Care Dawnell Bryant: [name] Other Clinician: [name] Referring Serenitie Vinton: Treating Zyshawn Bohnenkamp/[name]: Jimmy Footman in Treatment: 21 Visit Information History Since Last Visit All ordered tests and consults were completed: Yes Patient Arrived: Ambulatory Added or deleted any medications: No Arrival Time: 09:47 Any new allergies or adverse reactions: No Accompanied By: None Had a fall or experienced change in No Transfer Assistance: None activities of daily living that may affect Patient Requires Transmission-Based Precautions: No risk of falls: Patient Has Alerts: Yes Signs or symptoms of abuse/neglect since last visito No Patient Alerts: Patient on Blood Thinner Hospitalized since last visit: No L ABI: 0.82 Implantable device outside of the clinic excluding No R ABI: 0.47 cellular tissue based products placed in the center since last visit: Pain Present Now: No Notes Paper version used prior to treatment start. Electronic Signature(s) Signed: 11/10/2021 11:50:19 AM By: [name] EMT Entered By: [name] on 11/10/2021 11:50:19 -------------------------------------------------------------------------------- Encounter Discharge Information Details Patient Name: Date of Service: [name], [name]. 11/10/2021 10:00 A M Medical Record Number: [id number] Patient Account Number: [id number] Date of Birth/Sex: Treating RN: Feb 10, 1941 (81 y.o. [name] Primary Care Matthe Sloane: [name] Other Clinician: [name] Referring Roneka Gilpin: Treating Sheyna Pettibone/[name]: Jimmy Footman in  Treatment: 21 Encounter Discharge Information Items Discharge Condition: Stable Ambulatory Status: Ambulatory Discharge Destination: Home Transportation: Private Auto Accompanied By: None Schedule Follow-up Appointment: Yes Clinical Summary of Care: Electronic Signature(s) Signed: 11/10/2021 1:26:13 PM By: [name] EMT Entered By: [name] on 11/10/2021 13:26:12 -------------------------------------------------------------------------------- Patient/Caregiver Education Details Patient Name: Date of Service: [name], [name] 2/17/2023andnbsp10:00 [place] Record Number: [id number] Patient Account Number: [id number] Date of Birth/Gender: Treating RN: 1941/05/04 (81 y.o. [name] Primary Care Physician: [name] Other Clinician: [name] Referring Physician: Treating Physician/[name]: Jimmy Footman in Treatment: 21 Education Assessment Education Provided To: Patient Education Topics Provided Electronic Signature(s) Signed: 11/10/2021 3:20:23 PM By: [name] EMT Entered By: [name] on 11/10/2021 13:11:17 -------------------------------------------------------------------------------- Vitals Details Patient Name: Date of Service: [name], [name] 11/10/2021 10:00 A M Medical Record Number: [id number] Patient Account Number: [id number] Date of Birth/Sex: Treating RN: March 07, 1941 (81 y.o. [name] Primary Care Maat Kafer: [name] Other Clinician: [name] Referring Lennon Richins: Treating Kynzee Devinney/[name]: Jimmy Footman in Treatment: 21 Vital Signs Time Taken: 10:30 Temperature (F): 97.7 Height (in): 64 Pulse (bpm): 78 Weight (lbs): 135 Respiratory Rate (breaths/min): 16 Body Mass Index (BMI): 23.2 Blood Pressure (mmHg): 175/72 Capillary Blood Glucose (mg/dl): 213 Reference Range: 80 - 120 mg / dl Notes Paper version used prior to  treatment start. Electronic Signature(s) Signed: 11/10/2021 11:52:33 AM By: [name] EMT Entered By: [name] on 11/10/2021 11:52:33

## 2021-11-12 DIAGNOSIS — I10 Essential (primary) hypertension: Secondary | ICD-10-CM | POA: Diagnosis not present

## 2021-11-12 DIAGNOSIS — E785 Hyperlipidemia, unspecified: Secondary | ICD-10-CM | POA: Diagnosis not present

## 2021-11-12 DIAGNOSIS — U071 COVID-19: Secondary | ICD-10-CM | POA: Diagnosis not present

## 2021-11-12 DIAGNOSIS — R262 Difficulty in walking, not elsewhere classified: Secondary | ICD-10-CM | POA: Diagnosis not present

## 2021-11-12 DIAGNOSIS — Z7901 Long term (current) use of anticoagulants: Secondary | ICD-10-CM | POA: Diagnosis not present

## 2021-11-12 DIAGNOSIS — Z792 Long term (current) use of antibiotics: Secondary | ICD-10-CM | POA: Diagnosis not present

## 2021-11-12 DIAGNOSIS — I70202 Unspecified atherosclerosis of native arteries of extremities, left leg: Secondary | ICD-10-CM | POA: Diagnosis not present

## 2021-11-12 DIAGNOSIS — D638 Anemia in other chronic diseases classified elsewhere: Secondary | ICD-10-CM | POA: Diagnosis not present

## 2021-11-12 DIAGNOSIS — Z89412 Acquired absence of left great toe: Secondary | ICD-10-CM | POA: Diagnosis not present

## 2021-11-12 DIAGNOSIS — Z452 Encounter for adjustment and management of vascular access device: Secondary | ICD-10-CM | POA: Diagnosis not present

## 2021-11-12 DIAGNOSIS — Z7984 Long term (current) use of oral hypoglycemic drugs: Secondary | ICD-10-CM | POA: Diagnosis not present

## 2021-11-12 DIAGNOSIS — H409 Unspecified glaucoma: Secondary | ICD-10-CM | POA: Diagnosis not present

## 2021-11-12 DIAGNOSIS — Z89422 Acquired absence of other left toe(s): Secondary | ICD-10-CM | POA: Diagnosis not present

## 2021-11-12 DIAGNOSIS — Z7982 Long term (current) use of aspirin: Secondary | ICD-10-CM | POA: Diagnosis not present

## 2021-11-12 DIAGNOSIS — M6281 Muscle weakness (generalized): Secondary | ICD-10-CM | POA: Diagnosis not present

## 2021-11-12 DIAGNOSIS — K219 Gastro-esophageal reflux disease without esophagitis: Secondary | ICD-10-CM | POA: Diagnosis not present

## 2021-11-12 DIAGNOSIS — E11628 Type 2 diabetes mellitus with other skin complications: Secondary | ICD-10-CM | POA: Diagnosis not present

## 2021-11-12 DIAGNOSIS — Z4781 Encounter for orthopedic aftercare following surgical amputation: Secondary | ICD-10-CM | POA: Diagnosis not present

## 2021-11-13 ENCOUNTER — Other Ambulatory Visit: Payer: Self-pay

## 2021-11-13 ENCOUNTER — Telehealth: Payer: Self-pay | Admitting: Cardiovascular Disease

## 2021-11-13 ENCOUNTER — Encounter (HOSPITAL_BASED_OUTPATIENT_CLINIC_OR_DEPARTMENT_OTHER): Payer: Medicare Other | Admitting: Internal Medicine

## 2021-11-13 DIAGNOSIS — E11621 Type 2 diabetes mellitus with foot ulcer: Secondary | ICD-10-CM

## 2021-11-13 DIAGNOSIS — I10 Essential (primary) hypertension: Secondary | ICD-10-CM | POA: Diagnosis not present

## 2021-11-13 DIAGNOSIS — E11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: Secondary | ICD-10-CM | POA: Diagnosis not present

## 2021-11-13 DIAGNOSIS — I6523 Occlusion and stenosis of bilateral carotid arteries: Secondary | ICD-10-CM

## 2021-11-13 DIAGNOSIS — Z89412 Acquired absence of left great toe: Secondary | ICD-10-CM | POA: Diagnosis not present

## 2021-11-13 DIAGNOSIS — I70202 Unspecified atherosclerosis of native arteries of extremities, left leg: Secondary | ICD-10-CM | POA: Diagnosis not present

## 2021-11-13 DIAGNOSIS — D638 Anemia in other chronic diseases classified elsewhere: Secondary | ICD-10-CM | POA: Diagnosis not present

## 2021-11-13 DIAGNOSIS — M86671 Other chronic osteomyelitis, right ankle and foot: Secondary | ICD-10-CM

## 2021-11-13 DIAGNOSIS — T8131XD Disruption of external operation (surgical) wound, not elsewhere classified, subsequent encounter: Secondary | ICD-10-CM

## 2021-11-13 DIAGNOSIS — L97514 Non-pressure chronic ulcer of other part of right foot with necrosis of bone: Secondary | ICD-10-CM

## 2021-11-13 DIAGNOSIS — Z4781 Encounter for orthopedic aftercare following surgical amputation: Secondary | ICD-10-CM | POA: Diagnosis not present

## 2021-11-13 DIAGNOSIS — L97528 Non-pressure chronic ulcer of other part of left foot with other specified severity: Secondary | ICD-10-CM | POA: Diagnosis not present

## 2021-11-13 DIAGNOSIS — L97428 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: Secondary | ICD-10-CM | POA: Diagnosis not present

## 2021-11-13 DIAGNOSIS — E11628 Type 2 diabetes mellitus with other skin complications: Secondary | ICD-10-CM | POA: Diagnosis not present

## 2021-11-13 LAB — GLUCOSE, CAPILLARY
Glucose-Capillary: 156 mg/dL — ABNORMAL HIGH (ref 70–99)
Glucose-Capillary: 151 mg/dL — ABNORMAL HIGH (ref 70–99)

## 2021-11-13 NOTE — Telephone Encounter (Signed)
Order placed

## 2021-11-13 NOTE — Progress Notes (Addendum)
[name], [name] ([id number]) Visit Report for 11/13/2021 HBO Details Patient Name: Date of Service: [name] 11/13/2021 1:00 PM Medical Record Number: [id number] Patient Account Number: [id number] Date of Birth/Sex: Treating RN: 1941/08/17 (81 y.o. 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 HBO Treatment Details Treatment Number: 4 Patient Type: Outpatient Chamber Type: Monoplace Chamber Serial #: [id number] Treatment Protocol: 2.0 ATA with 90 minutes oxygen, and no air breaks Treatment Details Compression Rate Down: 1.5 psi / minute De-Compression Rate Up: 1.5 psi / minute Air breaks and breathing Decompress Decompress Compress Tx Pressure Begins Reached periods Begins Ends (leave unused spaces blank) Chamber Pressure (ATA 1 2 ------2 1 ) Clock Time (24 hr) 13:12 13:24 - - - - - - 14:54 15:02 Treatment Length: 110 (minutes) Treatment Segments: 4 Vital Signs Capillary Blood Glucose Reference Range: 80 - 120 mg / dl HBO Diabetic Blood Glucose Intervention Range: <131 mg/dl or >249 mg/dl Time Vitals Blood Respiratory Capillary Blood Glucose Pulse Action Type: Pulse: Temperature: Taken: Pressure: Rate: Glucose (mg/dl): Meter #: Oximetry (%) Taken: Pre 13:21 124/60 73 12 97.5 156 2 100 Post 15:10 148/62 68 12 97.7 151 2 100 Treatment Response Treatment Toleration: Well Treatment Completion Status: Treatment Completed without Adverse Event Additional Procedure  Documentation 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 A trained emergency response team is readily available per Yes hospital policies and procedures. Continue HBOT as ordered. Yes Electronic Signature(s) Signed: 11/13/2021 4:35:28 PM By: [name] DO Previous Signature: 11/13/2021 4:19:59 PM Version By: [name] CHT EMT BS , , Previous Signature: 11/13/2021 4:19:39 PM Version By: [name] CHT EMT BS , , Previous Signature: 11/13/2021 4:19:39 PM Version By: [name] CHT EMT BS , , Entered By: [name] on 11/13/2021 16:30:37 -------------------------------------------------------------------------------- HBO Safety Checklist Details Patient Name: Date of Service: [name] 11/13/2021 1:00 PM Medical Record Number: [id number] Patient Account Number: [id number] Date of Birth/Sex: Treating RN: Sep 27, 1940 (81 y.o. [name] Primary Care Wanona Stare: [name] Other Clinician: [name] Referring George Haggart: Treating Kong Packett/[name]: Jimmy Footman in Treatment: 21 HBO Safety Checklist Items Safety Checklist Consent Form Signed Patient voided / foley secured and emptied When did you last eato 1200 Last dose of injectable or oral agent 0800 Ostomy pouch emptied and vented if applicable NA All implantable devices assessed, documented and approved NA Intravenous access site secured and place Right Forearm PICC Valuables secured Linens and cotton and cotton/polyester blend (less than 51% polyester) Personal oil-based products / skin lotions / body lotions removed Wigs or hairpieces removed NA Smoking or tobacco materials removed NA Books / newspapers / magazines / loose paper removed Cologne, aftershave, perfume and deodorant removed Jewelry removed (may wrap wedding  band) Make-up removed Hair care products removed Battery operated devices (external) removed Heating patches and chemical warmers removed Titanium eyewear removed Nail polish cured greater than 10 hours Casting material cured greater than 10 hours NA Hearing aids removed NA Loose dentures or partials removed NA Prosthetics have been  removed NA Patient demonstrates correct use of air break device (if applicable) Patient concerns have been addressed Patient grounding bracelet on and cord attached to chamber Specifics for Inpatients (complete in addition to above) Medication sheet sent with patient NA Intravenous medications needed or due during therapy sent with patient NA Drainage tubes (e.g. nasogastric tube or chest tube secured and vented) NA Endotracheal or Tracheotomy tube secured NA Cuff deflated of air and inflated with saline NA Airway suctioned NA Notes Paper version used prior to treatment start Electronic Signature(s) Signed: 11/17/2021 10:22:35 AM By: [name] CHT EMT BS , , Previous Signature: 11/13/2021 4:28:55 PM Version By: [name] CHT EMT BS , , Entered By: [name] on 11/16/2021 13:21:38

## 2021-11-13 NOTE — Progress Notes (Signed)
ICEY, TELLO ([id number]) Visit Report for 11/10/2021 Problem List Details Patient Name: Date of Service: [name] 11/10/2021 10:00 A M Medical Record Number: [id number] Patient Account Number: [id number] Date of Birth/Sex: Treating RN: 1941/07/10 (81 y.o. 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 06/15/2021 No Yes 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, right ankle and foot 10/25/2021 No Yes Inactive Problems ICD-10 Code Description Active Date Inactive Date L97.428 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 06/15/2021 06/15/2021 L97.528 Non-pressure chronic ulcer of other part of left foot with other specified severity 06/15/2021 06/15/2021 Resolved Problems Electronic Signature(s) Signed: 11/10/2021 1:10:59 PM By: [name] EMT Signed: 11/13/2021 8:58:25 AM By: [name] DO Entered By: [name] on 11/10/2021 13:10:59 -------------------------------------------------------------------------------- SuperBill Details Patient Name: Date of Service: [name], [name] 11/10/2021 Medical Record Number: [id number] Patient Account Number: [id number] Date of Birth/Sex: Treating RN: 1940/09/30 (81 y.o. 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, right ankle and foot Facility Procedures CPT4 Code: [id number] Description: G0277-(Facility Use Only) HBOT full body chamber, 59min , ICD-10 Diagnosis Description E11.621 Type 2 diabetes mellitus with foot ulcer T81.31XD Disruption of external operation (surgical) wound, not elsewhere classified, subse L97.514  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 Modifier: quent encounter Quantity: 4 Physician Procedures : CPT4 Code Description Modifier [id number] - WC PHYS HYPERBARIC OXYGEN THERAPY ICD-10 Diagnosis Description E11.621 Type 2 diabetes mellitus with foot ulcer T81.31XD Disruption of external operation (surgical) wound, not elsewhere classified,  subsequent encounter 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

## 2021-11-13 NOTE — Progress Notes (Signed)
[name] ([id number]) Visit Report for 11/13/2021 Arrival Information Details Patient Name: Date of Service: [name] 11/13/2021 1:00 PM Medical Record Number: [id number] Patient Account Number: [id number] Date of Birth/Sex: Treating RN: [birth year] (81 y.o. [name] Primary Care Demarr Kluever: [name] Other Clinician: [name] Referring Kitrina Maurin: Treating Florina Glas/[name]: Jimmy Footman in Treatment: 21 Visit Information History Since Last Visit All ordered tests and consults were completed: Yes Patient Arrived: Ambulatory Added or deleted any medications: No Arrival Time: 12:44 Any new allergies or adverse reactions: No Accompanied By: Self Had a fall or experienced change in No Transfer Assistance: None activities of daily living that may affect Patient Identification Verified: Yes risk of falls: Secondary Verification Process Completed: Yes Signs or symptoms of abuse/neglect since last visito No Patient Requires Transmission-Based Precautions: No Hospitalized since last visit: No Patient Has Alerts: Yes Implantable device outside of the clinic excluding Yes Patient Alerts: Patient on Blood Thinner cellular tissue based products placed in the center L ABI: 0.82 since last visit: R ABI: 0.47 Pain Present Now: No Electronic Signature(s) Signed: 11/13/2021 4:28:55 PM By: [name] CHT EMT BS , , Entered By: [name] on 11/13/2021 14:05:33 -------------------------------------------------------------------------------- Encounter Discharge Information Details Patient Name: Date of Service: [name] Dupes, [name] 11/13/2021 1:00 PM Medical Record Number: [id number] Patient Account Number: [id number] Date of Birth/Sex: Treating RN: 1940-11-29 (81 y.o. [name] Primary Care Seleen Walter: [name] Other Clinician: [name] Referring [name] [name]: Treating Analeya Luallen/[name]:  Jimmy Footman in Treatment: 21 Encounter Discharge Information Items Discharge Condition: Stable Ambulatory Status: Ambulatory Discharge Destination: Home Transportation: Private Auto Accompanied By: self Schedule Follow-up Appointment: No Clinical Summary of Care: Electronic Signature(s) Signed: 11/13/2021 4:21:21 PM By: [name] CHT EMT BS , , Entered By: [name] on 11/13/2021 16:21:21 -------------------------------------------------------------------------------- Vitals Details Patient Name: Date of Service: [name] Dupes, [name] 11/13/2021 1:00 PM Medical Record Number: [id number] Patient Account Number: [id number] Date of Birth/Sex: Treating RN: [birth year] (81 y.o. [name] Primary Care Tijana Walder: [name] Other Clinician: [name] Referring Shakora Nordquist: Treating Kyrielle Urbanski/[name]: Jimmy Footman in Treatment: 21 Vital Signs Time Taken: 13:21 Temperature (F): 97.5 Height (in): 64 Pulse (bpm): 73 Weight (lbs): 135 Respiratory Rate (breaths/min): 12 Body Mass Index (BMI): 23.2 Blood Pressure (mmHg): 124/60 Capillary Blood Glucose (mg/dl): 156 Reference Range: 80 - 120 mg / dl Airway Pulse Oximetry (%): 100 Electronic Signature(s) Signed: 11/13/2021 4:28:55 PM By: [name] CHT EMT BS , , Entered By: [name] on 11/13/2021 14:08:08

## 2021-11-13 NOTE — Progress Notes (Signed)
[facility] [place]  ASSESSMENT / PLAN: 81 y.o. female with resolving right foot ulceration.  Good result from recent endovascular intervention.  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  She is getting IV antibiotics for this.  She is getting hyperbaric oxygen therapy.  I am hopeful that she will improve without further intervention.  I will see her again in several months with a repeat ABI to evaluate the foot.  If she has persistent ulceration, she may need further foot debridement.  CHIEF COMPLAINT: Status post endovascular intervention in the right lower extremity  HISTORY OF PRESENT ILLNESS: [name] is a 81 y.o. female very well-known to me.  She returns to clinic after endovascular intervention in her right lower extremity to salvage her right femoral-popliteal bypass graft.  She tolerated this very well.  She did receive Versed, which he has a poor reaction to in the past.  She required observation overnight.  She was discharged next day without difficulty.  She is getting IV antibiotics.  She is getting hyperbaric oxygen therapy.  These both seem to be helping her foot.  She feels well overall.  VASCULAR SURGICAL HISTORY:  07/21/19: Right external iliac, common femoral, profundofemoral, and superficial femoral endarterectomy with bovine pericardial patch angioplasty 08/16/20: Right common femoral endarterectomy with bovine pericardial patch angioplasty; Right common femoral to above-knee popliteal artery bypass with 6 mm ringed PTFE 04/28/21: LLE angiogram 05/03/21: left common femoral - tibioperoneal trunk bypass with 54mm ringed ePTFE in anatomic tunnel; left third toe open amputation 05/11/21: Open left transmetatarsal amputation 09/07/21: Angioplasty of left lower extremity distal bypass anastomosis and anterior tibial artery (3 mm [place]) 10/13/21: Right iliofemoral drug-coated angioplasty (6x37mm [place]); Right popliteal drug-coated angioplasty (4x144mm  [place]); Right tibioperoneal trunk and peroneal angioplasty (3x51mm [place])  Past Medical History:  Diagnosis Date   Anemia    after CABG in june 2021   Arthritis    Cancer ([place])    removal from nose - MOSE procedure    Complication of anesthesia    VERSED- agitated, muscle spasms, "jerking" , frantic , (never had this occurence in the pas)    Coronary artery disease    Diabetes mellitus without complication ([place])    Dysrhythmia    PVC's   GERD (gastroesophageal reflux disease)    Heart murmur    History of hiatal hernia    Hyperlipidemia    Hypertension 11/20/11   ECHO- EF>55% Borderline concentric left ventricular hypertrophy. There is a small calcified mass in the L:A near the LA appendage. No valvular masses seen with associated mitral annular calcification. LA Volume/ BSA27.4 ml/m2 No AS. Right ventricular systolic pressure is elevated at 65mmHg.   Myocardial infarction [facility])    June 2021   Neuromuscular disorder ([place])    neuropathy in bilateral feet   Peripheral vascular disease ([place])    Pneumonia    not hosp.    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  PTCAs with drug coated balloon Lt ant tibial artery and Lt popliteal artery  11/26/2019    Past Surgical History:  Procedure Laterality Date   ABDOMINAL AORTAGRAM  11/25/2019   ABDOMINAL AORTOGRAM W/LOWER EXTREMITY    ABDOMINAL AORTOGRAM N/A 06/15/2019   Procedure: ABDOMINAL AORTOGRAM;  Surgeon: [name], MD;  Location: [place] [facility];  Service: Cardiovascular;  Laterality: N/A;   ABDOMINAL AORTOGRAM W/LOWER EXTREMITY N/A 11/25/2019   Procedure: ABDOMINAL AORTOGRAM W/LOWER EXTREMITY;  Surgeon: [name], MD;  Location: [place] [facility];  Service: Cardiovascular;  Laterality: N/A;  Lt leg   ABDOMINAL AORTOGRAM W/LOWER EXTREMITY N/A 08/01/2020   Procedure: ABDOMINAL AORTOGRAM W/LOWER EXTREMITY;  Surgeon: [name], MD;  Location: [place] [facility];  Service: Cardiovascular;   Laterality: N/A;   ABDOMINAL AORTOGRAM W/LOWER EXTREMITY N/A 04/28/2021   Procedure: ABDOMINAL AORTOGRAM W/LOWER EXTREMITY;  Surgeon: [name], MD;  Location: [place] [facility];  Service: Cardiovascular;  Laterality: N/A;   ABDOMINAL AORTOGRAM W/LOWER EXTREMITY N/A 10/13/2021   Procedure: ABDOMINAL AORTOGRAM W/LOWER EXTREMITY;  Surgeon: [name], MD;  Location: [place] [facility];  Service: Cardiovascular;  Laterality: N/A;   AMPUTATION TOE Left 05/03/2021   Procedure: AMPUTATION OF THIRD LEFT  TOE;  Surgeon: [name], MD;  Location[facility];  Service: Vascular;  Laterality: Left;   APPENDECTOMY     APPLICATION OF WOUND VAC Right 07/21/2019   Procedure: Application Of Prevena Wound Vac Right Groin;  Surgeon: [name], MD;  Location: [facility] OR;  Service: Vascular;  Laterality: Right;   CARDIAC CATHETERIZATION     CARPAL [place]     x 2   COLONOSCOPY     CORONARY ARTERY BYPASS GRAFT N/A 03/24/2020   Procedure: CORONARY ARTERY BYPASS GRAFTING (CABG) using LIMA to LAD (m); RIMA to RAMUS; Endoscopic Right Greater Saphenous Vein: SVG to Diag1; SVG to PLB (right); and SVG to PL (left).;  Surgeon: [name], MD;  Location: [place];  Service: Open Heart Surgery;  Laterality: N/A;  BILATERAL IMA   ENDARTERECTOMY FEMORAL Right 07/21/2019   Procedure: RIGHT ENDARTERECTOMY FEMORAL WITH PATCH ANGIOPLASTY;  Surgeon: [name], MD;  Location: [place];  Service: Vascular;  Laterality: Right;   ENDARTERECTOMY FEMORAL Right 08/16/2020   Procedure: RIGHT FEMORAL ENDARTERECTOMY;  Surgeon: [name], MD;  Location: [place];  Service: Vascular;  Laterality: Right;   ENDOVEIN HARVEST OF GREATER SAPHENOUS VEIN Right 03/24/2020   Procedure: ENDOVEIN HARVEST OF GREATER SAPHENOUS VEIN;  Surgeon: [name], MD;  Location: [place];  Service: Open Heart Surgery;  Laterality: Right;   EYE SURGERY     cataract removal  bilaterally   FEMORAL-POPLITEAL BYPASS GRAFT Right 08/16/2020   Procedure: BYPASS GRAFT FEMORAL-POPLITEAL ARTERY;  Surgeon: [name], MD;  Location: [place];  Service: Vascular;  Laterality: Right;   FEMORAL-POPLITEAL BYPASS GRAFT Left 05/03/2021   Procedure: LEFT FEMORAL TO BELOW KNEE POPLITEAL ARTERY BYPASS GRAFTING WITH 6MMX80 PTFE GRAFT;  Surgeon: [name], MD;  Location: [place];  Service: Vascular;  Laterality: Left;   INTRAVASCULAR LITHOTRIPSY  11/25/2019   Procedure: INTRAVASCULAR LITHOTRIPSY;  Surgeon: [name], MD;  Location: [place] [facility];  Service: Cardiovascular;;  LT. SFA   LEFT HEART CATH AND CORONARY ANGIOGRAPHY N/A 03/22/2020   Procedure: LEFT HEART CATH AND CORONARY ANGIOGRAPHY;  Surgeon: Martinique, Peter M, MD;  Location: [place] [facility];  Service: Cardiovascular;  Laterality: N/A;   LOWER EXTREMITY ANGIOGRAPHY Bilateral 06/15/2019   Procedure: Lower Extremity Angiography;  Surgeon: [name], MD;  Location: [place] [facility];  Service: Cardiovascular;  Laterality: Bilateral;   LOWER EXTREMITY ANGIOGRAPHY Right 08/03/2019   Procedure: LOWER EXTREMITY ANGIOGRAPHY;  Surgeon: [name], MD;  Location: [place] [facility];  Service: Cardiovascular;  Laterality: Right;   LOWER EXTREMITY ANGIOGRAPHY N/A 09/07/2021   Procedure: LOWER EXTREMITY ANGIOGRAPHY;  Surgeon: Marty Heck, MD;  Location: [place] [facility];  Service: Cardiovascular;  Laterality: N/A;   PATCH ANGIOPLASTY Right 07/21/2019   Procedure: Patch Angioplasty Right Femoral Artery;  Surgeon: [name], MD;  Location: [place];  Service: Vascular;  Laterality: Right;   PERIPHERAL VASCULAR ATHERECTOMY  08/03/2019   Procedure: PERIPHERAL VASCULAR ATHERECTOMY;  Surgeon: [name], MD;  Location: [place] [facility];  Service: Cardiovascular;;  right SFA, right TP trunk   PERIPHERAL VASCULAR ATHERECTOMY  11/25/2019   Procedure: PERIPHERAL VASCULAR ATHERECTOMY;   Surgeon: [name], MD;  Location: [place] [facility];  Service: Cardiovascular;;  Lt.  POPLITEAL and AT   PERIPHERAL VASCULAR BALLOON ANGIOPLASTY Left 06/15/2019   Procedure: PERIPHERAL VASCULAR BALLOON ANGIOPLASTY;  Surgeon: [name], MD;  Location: [place] [facility];  Service: Cardiovascular;  Laterality: Left;  SFA UNSUCCESSFUL UNABLE TO CROSS LESION   PERIPHERAL VASCULAR BALLOON ANGIOPLASTY  08/03/2019   Procedure: PERIPHERAL VASCULAR BALLOON ANGIOPLASTY;  Surgeon: [name], MD;  Location: [place] [facility];  Service: Cardiovascular;;  right SFA, Right TP trunk   PERIPHERAL VASCULAR BALLOON ANGIOPLASTY  09/07/2021   Procedure: PERIPHERAL VASCULAR BALLOON ANGIOPLASTY;  Surgeon: Marty Heck, MD;  Location: [place] [facility];  Service: Cardiovascular;;   PERIPHERAL VASCULAR BALLOON ANGIOPLASTY Right 10/13/2021   Procedure: PERIPHERAL VASCULAR BALLOON ANGIOPLASTY;  Surgeon: [name], MD;  Location: [place] [facility];  Service: Cardiovascular;  Laterality: Right;   TEE WITHOUT CARDIOVERSION N/A 03/24/2020   Procedure: TRANSESOPHAGEAL ECHOCARDIOGRAM (TEE);  Surgeon: [name], MD;  Location: [place];  Service: Open Heart Surgery;  Laterality: N/A;   TONSILLECTOMY     and adenoidectomy   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/2019   Procedure: TRANSMETATARSAL AMPUTATION;  Surgeon: Trula Slade, DPM;  Location: [place];  Service: Podiatry;  Laterality: Right;   TRANSMETATARSAL AMPUTATION Left 05/11/2021   Procedure: LEFT TRANSMETATARSAL AMPUTATION;  Surgeon: [name], MD;  Location: [facility] OR;  Service: Vascular;  Laterality: Left;   TUBAL LIGATION      Family History  Problem Relation Age of Onset   Cancer - Prostate Father    Cancer - Colon Father    Stroke Mother    Hypertension Mother    Hyperlipidemia Mother    Melanoma Brother     Social History   Socioeconomic History   Marital status: Widowed    Spouse name: Not on  file   Number of children: Not on file   Years of education: Not on file   Highest education level: Not on file  Occupational History   Not on file  Tobacco Use   Smoking status: Former    Types: Cigarettes    Quit date: 03/31/1972    Years since quitting: 49.6   Smokeless tobacco: Never  Vaping Use   Vaping Use: Never used  Substance and Sexual Activity   Alcohol use: No   Drug use: No   Sexual activity: Not on file  Other Topics Concern   Not on file  Social History Narrative   Not on file   Social Determinants of Health   Financial Resource Strain: Not on file  Food Insecurity: Not on file  Transportation Needs: Not on file  Physical Activity: Not on file  Stress: Not on file  Social Connections: Not on file  Intimate Partner Violence: Not on file    Allergies  Allergen Reactions   Augmentin [Amoxicillin-Pot Clavulanate] Other (See  Comments)    Elevates potassium level   Bactrim [Sulfamethoxazole-Trimethoprim] Other (See Comments)    ^ K+( elevated)    Demerol [Meperidine]     Delusional    Scopolamine     Delusional    Tramadol Other (See Comments)    Keeps awake   Versed [Midazolam] Anxiety    Frantic, out of my mind, agitated     Current Outpatient Medications  Medication Sig Dispense Refill   acetaminophen (TYLENOL) 500 MG tablet Take 1,000 mg by mouth every 6 (six) hours as needed for moderate pain or headache.     aspirin EC 81 MG tablet Take 81 mg by mouth at bedtime.      atorvastatin (LIPITOR) 80 MG tablet Take 1 tablet (80 mg total) by mouth daily. Patient must keep upcoming appointment to receive future refills. (Patient taking differently: Take 80 mg by mouth every evening. Patient must keep upcoming appointment to receive future refills.) 90 tablet 0   brimonidine (ALPHAGAN) 0.2 % ophthalmic solution Place 1 drop into the right eye 2 (two) times daily.     CINNAMON PO Take 2 tablets by mouth in the morning, at noon, and at bedtime.       empagliflozin (JARDIANCE) 25 MG TABS tablet Take 1 tablet (25 mg total) by mouth daily before breakfast. 30 tablet 2   FEROCON capsule Take 1 capsule by mouth 2 (two) times daily.     glimepiride (AMARYL) 2 MG tablet TAKE 1 TABLET BY MOUTH ONCE DAILY BEFORE BREAKFAST 30 tablet 0   glucose blood (ONETOUCH VERIO) test strip Use to check blood sugars once daily 100 each 5   hydrochlorothiazide (HYDRODIURIL) 12.5 MG tablet Take 1 tablet by mouth once daily 90 tablet 0   Lancets (ONETOUCH DELICA PLUS [id number]) MISC Use to check blood sugars twice daily. 100 each 5   metFORMIN (GLUCOPHAGE) 1000 MG tablet Take 1 tablet by mouth twice daily (Patient taking differently: Take 1,000 mg by mouth in the morning.) 180 tablet 3   metoprolol tartrate (LOPRESSOR) 25 MG tablet TAKE 1 & 1/2 (ONE & ONE-HALF) TABLETS BY MOUTH TWICE DAILY 270 tablet 2   pantoprazole (PROTONIX) 40 MG tablet Take 1 tablet (40 mg total) by mouth daily. (Patient taking differently: Take 40 mg by mouth every Monday, Wednesday, and Friday.) 90 tablet 1   Probiotic Product ([employer] SENIOR PROBIOTIC PO) Take 1 capsule by mouth every evening.     Propylene Glycol (SYSTANE BALANCE) 0.6 % SOLN Place 1 drop into the left eye 3 (three) times daily as needed (dry/irritated eyes).     pyridoxine (B-6) 100 MG tablet Take 100 mg by mouth every evening.      repaglinide (PRANDIN) 1 MG tablet Take 1 tablet (1 mg total) by mouth daily before breakfast. 30 tablet 3   telmisartan (MICARDIS) 80 MG tablet Take 1 tablet by mouth once daily 90 tablet 3   XARELTO 2.5 MG TABS tablet Take 1 tablet by mouth twice daily 60 tablet 0   Current Facility-Administered Medications  Medication Dose Route Frequency Provider Last Rate Last Admin   sodium chloride flush (NS) 0.9 % injection 3 mL  3 mL Intravenous Q12H [name], MD        PHYSICAL EXAM Vitals:   11/14/21 1017  BP: (!) 168/66  Pulse: 76  Resp: 20  Temp: 97.7 F (36.5 C)  SpO2: 100%  Weight: 150  lb (68 kg)  Height: 5' (1.524 m)    Constitutional: well  appearing. no distress. Appears well nourished.  Neurologic: CN intact. no focal findings. no sensory loss. Psychiatric:  Mood and affect symmetric and appropriate. Eyes:  No icterus. No conjunctival pallor. Ears, nose, throat:  mucous membranes moist. Midline trachea.  Cardiac: regular rate and rhythm.  Respiratory:  unlabored. Abdominal:  soft, non-tender, non-distended.  Peripheral vascular: no palpable pedal pulses Extremity: no edema. no cyanosis. no pallor.  Skin: resolving ulceration of right TMA stump.  Lymphatic: no Stemmer's sign. n palpable lymphadenopathy.  PERTINENT LABORATORY AND RADIOLOGIC DATA  Most recent CBC CBC Latest Ref Rng & Units 10/27/2021 10/13/2021 10/13/2021  WBC 4.0 - 10.5 K/uL 9.5 9.8 -  Hemoglobin 12.0 - 15.0 g/dL 10.4(L) 9.4(L) 12.6  Hematocrit 36.0 - 46.0 % 33.1(L) 28.2(L) 37.0  Platelets 150 - 400 K/uL 285 152 -     Most recent CMP CMP Latest Ref Rng & Units 10/27/2021 10/19/2021 10/13/2021  Glucose 70 - 99 mg/dL 232(H) 167(H) 235(H)  BUN 8 - 23 mg/dL 16 14 24(H)  Creatinine 0.44 - 1.00 mg/dL 0.94 1.02 1.21(H)  Sodium 135 - 145 mmol/L 137 143 139  Potassium 3.5 - 5.1 mmol/L 3.9 4.9 3.7  Chloride 98 - 111 mmol/L 104 104 106  CO2 22 - 32 mmol/L 26 29 25   Calcium 8.9 - 10.3 mg/dL 9.8 9.9 8.6(L)  Total Protein 6.5 - 8.1 g/dL - - 4.8(L)  Total Bilirubin 0.3 - 1.2 mg/dL - - 0.6  Alkaline Phos 38 - 126 U/L - - 33(L)  AST 15 - 41 U/L - - 32  ALT 0 - 44 U/L - - 21    Renal function Estimated Creatinine Clearance: 41.8 mL/min (by C-G formula based on SCr of 0.94 mg/dL).  Hgb A1c MFr Bld (%)  Date Value  09/29/2021 7.6 (H)    LDL (calc)  Date Value Ref Range Status  04/06/2013 87 <100 mg/dL Final    Comment:    LDL-C is inaccurate if patient is nonfasting.   Reference Range: ---------------- Optimal:            <100 Near/Above Optimal: 100-129 Borderline High:    130-159 High:                160-189 Very High:          >=190       LDL Chol Calc (NIH)  Date Value Ref Range Status  07/28/2020 54 0 - 99 mg/dL Final   LDL Cholesterol  Date Value Ref Range Status  10/14/2021 50 0 - 99 mg/dL Final    Comment:           Total Cholesterol/HDL:CHD Risk Coronary Heart Disease Risk Table                     Men   Women  1/2 Average Risk   3.4   3.3  Average Risk       5.0   4.4  2 X Average Risk   9.6   7.1  3 X Average Risk  23.4   11.0        Use the calculated Patient Ratio above and the CHD Risk Table to determine the patient's CHD Risk.        ATP III CLASSIFICATION (LDL):  <100     mg/dL   Optimal  100-129  mg/dL   Near or Above  Optimal  130-159  mg/dL   Borderline  160-189  mg/dL   High  >190     mg/dL   Very High Performed at [facility] [address] [place], [place] [address]      Vascular Imaging:  +-------+-----------+-----------+------------+------------+   ABI/TBI Today's ABI Today's TBI Previous ABI Previous TBI   +-------+-----------+-----------+------------+------------+   Right   0.97        amputated   0                           +-------+-----------+-----------+------------+------------+   Left    0.63        amputated   0.86                        +-------+-----------+-----------+------------+------------+  \  Arterial duplex of right lower extremity personally reviewed.  No evidence of hemodynamically significant stenosis in the right lower extremity.  [name]. Stanford Breed, MD [facility] Phone Number: [phone] 11/14/2021 2:52 PM  Total time spent on preparing this encounter including chart review, data review, collecting history, examining the patient, coordinating care for this established patient, 30 minutes.  Portions of this report may have been transcribed using voice recognition software.  Every effort has been made to ensure accuracy; however,  inadvertent computerized transcription errors may still be present.

## 2021-11-13 NOTE — Telephone Encounter (Signed)
Patient called to reschedule 2/23 carotid due to a scheduling conflict and patient needing a morning, but we were unable to reschedule because order expires on 2/23. Please place new order.

## 2021-11-13 NOTE — Progress Notes (Signed)
[name], [name] ([id number]) Visit Report for 11/13/2021 SuperBill Details Patient Name: Date of Service: [name] 11/13/2021 Medical Record Number: [id number] Patient Account Number: [id number] Date of Birth/Sex: Treating RN: 23-Nov-1940 (81 y.o. 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, right ankle and foot Facility Procedures CPT4 Code Description Modifier Quantity [id number]-(Facility Use Only) HBOT full body chamber, 46min , 4 ICD-10 Diagnosis Description 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, 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 T81.31XD Disruption of external operation (surgical) wound, not elsewhere classified, subsequent encounter E11.621 Type 2 diabetes mellitus with foot ulcer Physician Procedures Quantity CPT4 Code Description Modifier [id number] - WC PHYS HYPERBARIC OXYGEN THERAPY 1 ICD-10 Diagnosis Description 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

## 2021-11-14 ENCOUNTER — Ambulatory Visit (HOSPITAL_COMMUNITY)
Admission: RE | Admit: 2021-11-14 | Discharge: 2021-11-14 | Disposition: A | Discharge status: Home / Self Care | Payer: Medicare Other | Source: Ambulatory Visit | Attending: Vascular Surgery | Admitting: Vascular Surgery

## 2021-11-14 ENCOUNTER — Encounter: Payer: Self-pay | Admitting: Vascular Surgery

## 2021-11-14 ENCOUNTER — Encounter (HOSPITAL_BASED_OUTPATIENT_CLINIC_OR_DEPARTMENT_OTHER): Payer: Medicare Other | Admitting: Internal Medicine

## 2021-11-14 ENCOUNTER — Ambulatory Visit (INDEPENDENT_AMBULATORY_CARE_PROVIDER_SITE_OTHER): Payer: Medicare Other | Admitting: Vascular Surgery

## 2021-11-14 ENCOUNTER — Ambulatory Visit (INDEPENDENT_AMBULATORY_CARE_PROVIDER_SITE_OTHER)
Admission: RE | Admit: 2021-11-14 | Discharge: 2021-11-14 | Disposition: A | Discharge status: Home / Self Care | Payer: Medicare Other | Source: Ambulatory Visit | Attending: Vascular Surgery | Admitting: Vascular Surgery

## 2021-11-14 VITALS — BP 168/66 | HR 76 | Temp 97.7°F | Resp 20 | Ht 60.0 in | Wt 150.0 lb

## 2021-11-14 DIAGNOSIS — L97512 Non-pressure chronic ulcer of other part of right foot with fat layer exposed: Secondary | ICD-10-CM | POA: Diagnosis not present

## 2021-11-14 DIAGNOSIS — M86671 Other chronic osteomyelitis, right ankle and foot: Secondary | ICD-10-CM | POA: Diagnosis not present

## 2021-11-14 DIAGNOSIS — I739 Peripheral vascular disease, unspecified: Secondary | ICD-10-CM

## 2021-11-14 DIAGNOSIS — L97514 Non-pressure chronic ulcer of other part of right foot with necrosis of bone: Secondary | ICD-10-CM | POA: Diagnosis not present

## 2021-11-14 DIAGNOSIS — E11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: Secondary | ICD-10-CM | POA: Diagnosis not present

## 2021-11-14 DIAGNOSIS — L97528 Non-pressure chronic ulcer of other part of left foot with other specified severity: Secondary | ICD-10-CM | POA: Diagnosis not present

## 2021-11-14 DIAGNOSIS — E11621 Type 2 diabetes mellitus with foot ulcer: Secondary | ICD-10-CM | POA: Diagnosis not present

## 2021-11-14 DIAGNOSIS — L97428 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: Secondary | ICD-10-CM | POA: Diagnosis not present

## 2021-11-14 LAB — GLUCOSE, CAPILLARY
Glucose-Capillary: 126 mg/dL — ABNORMAL HIGH (ref 70–99)
Glucose-Capillary: 132 mg/dL — ABNORMAL HIGH (ref 70–99)
Glucose-Capillary: 91 mg/dL (ref 70–99)

## 2021-11-14 NOTE — Progress Notes (Addendum)
[name], [name] ([id number]) Visit Report for 11/14/2021 HBO Details Patient Name: Date of Service: [name] 11/14/2021 1:00 PM Medical Record Number: [id number] Patient Account Number: [id number] Date of Birth/Sex: Treating RN: May 22, 1941 (81 y.o. 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 HBO Treatment Details Treatment Number: 5 Patient Type: Outpatient Chamber Type: Monoplace Chamber Serial #: [id number] Treatment Protocol: 2.0 ATA with 90 minutes oxygen, and no air breaks Treatment Details Compression Rate Down: 1.5 psi / minute De-Compression Rate Up: 1.5 psi / minute Air breaks and breathing Decompress Decompress Compress Tx Pressure Begins Reached periods Begins Ends (leave unused spaces blank) Chamber Pressure (ATA 1 2 ------2 1 ) Clock Time (24 hr) 13:35 13:46 - - - - - - 15:16 15:25 Treatment Length: 110 (minutes) Treatment Segments: 4 Vital Signs Capillary Blood Glucose Reference Range: 80 - 120 mg / dl HBO Diabetic Blood Glucose Intervention Range: <131 mg/dl or >249 mg/dl Type: Time Vitals Blood Pulse: Respiratory Temperature: Capillary Blood Glucose Pulse Action Taken: Pressure: Rate: Glucose (mg/dl): Meter #: Oximetry (%) Taken: Pre 13:05 142/62 76 16 97.5 126 2 100 Patient given 8 oz Glucerna Shake Post 15:35 148/62 75 16 98.4 91 2 Patient given 8 oz Glucerna Shake, per protocol. Pre 13:31 132 2 100 Treatment Response Treatment Completion  Status: Treatment Completed without Adverse Event Treatment Notes Per protocol patient was given an 8 oz Glucerna after a post treatment glucose level of 91 mg/dL. After 15 minutes patient was asymptomatic for hypoglycemia. Patient was discharged per protocol. Additional Procedure Documentation Tissue Sevierity: Fat layer exposed Thelton Graca Notes no concerns with treatment given Physician HBO Attestation: I certify that I supervised this HBO treatment in accordance with Medicare guidelines. A trained emergency response team is readily available per Yes hospital policies and procedures. Continue HBOT as ordered. Yes Electronic Signature(s) Signed: 11/14/2021 4:44:59 PM By: [name] MD Previous Signature: 11/14/2021 4:00:30 PM Version By: [name] CHT EMT BS , , Previous Signature: 11/14/2021 4:00:06 PM Version By: [name] CHT EMT BS , , Entered By: [name] on 11/14/2021 16:05:35 -------------------------------------------------------------------------------- HBO Safety Checklist Details Patient Name: Date of Service: [place]. 11/14/2021 1:00 PM Medical Record Number: [id number] Patient Account Number: [id number] Date of Birth/Sex: Treating RN: 1940/10/02 (81 y.o. [name], [name] Primary Care Salley Boxley: [name] Other Clinician: [name] Referring Fionna Merriott: Treating Tyia Binford/[name]: Tressie Stalker in Treatment: 21 HBO Safety Checklist Items Safety Checklist Consent Form Signed Patient voided / foley secured and emptied When did you last eato 1215 Last dose of injectable or oral agent 0800 Ostomy pouch emptied and vented if applicable NA All implantable devices assessed, documented and approved NA Intravenous access site secured and place Right Forearm PICC Valuables secured Linens and cotton and cotton/polyester blend (less than 51% polyester) Personal oil-based products / skin lotions  / body lotions removed Wigs or hairpieces removed Patient removed item. Smoking or tobacco materials removed NA Books / newspapers / magazines / loose paper removed Cologne, aftershave, perfume and deodorant removed Jewelry removed (may wrap wedding band) Make-up removed Hair care products  removed Battery operated devices (external) removed Heating patches and chemical warmers removed Titanium eyewear removed Nail polish cured greater than 10 hours Casting material cured greater than 10 hours NA Hearing aids removed NA Loose dentures or partials removed NA Prosthetics have been removed NA Patient demonstrates correct use of air break device (if applicable) Patient concerns have been addressed Patient grounding bracelet on and cord attached to chamber Specifics for Inpatients (complete in addition to above) Medication sheet sent with patient NA Intravenous medications needed or due during therapy sent with patient NA Drainage tubes (e.g. nasogastric tube or chest tube secured and vented) NA Endotracheal or Tracheotomy tube secured NA Cuff deflated of air and inflated with saline NA Airway suctioned NA Notes Paper version used prior to treatment start. Electronic Signature(s) Signed: 11/17/2021 10:22:35 AM By: [name] CHT EMT BS , , Signed: 11/17/2021 10:22:35 AM By: [name] CHT EMT BS , , Previous Signature: 11/14/2021 5:47:05 PM Version By: [name] CHT EMT BS , , Entered By: [name] on 11/16/2021 13:21:10

## 2021-11-14 NOTE — Progress Notes (Signed)
[name], MACKIE ([id number]) Visit Report for 11/14/2021 Arrival Information Details Patient Name: Date of Service: [name] 11/14/2021 1:00 PM Medical Record Number: [id number] Patient Account Number: [id number] Date of Birth/Sex: Treating RN: Dec 15, 1940 (81 y.o. [name], [name] Primary Care Jakaiden Fill: [name] Other Clinician: [name] Referring Malania Gawthrop: Treating Shereka Lafortune/[name]: Tressie Stalker in Treatment: 21 Visit Information History Since Last Visit All ordered tests and consults were completed: Yes Patient Arrived: Ambulatory Added or deleted any medications: No Arrival Time: 12:43 Any new allergies or adverse reactions: No Accompanied By: self Had a fall or experienced change in No Transfer Assistance: None activities of daily living that may affect Patient Identification Verified: Yes risk of falls: Secondary Verification Process Completed: Yes Signs or symptoms of abuse/neglect since last visito No Patient Requires Transmission-Based Precautions: No Hospitalized since last visit: No Patient Has Alerts: Yes Implantable device outside of the clinic excluding No Patient Alerts: Patient on Blood Thinner cellular tissue based products placed in the center L ABI: 0.82 since last visit: R ABI: 0.47 Pain Present Now: No Electronic Signature(s) Signed: 11/14/2021 5:47:05 PM By: [name] CHT EMT BS , , Entered By: [name] on 11/14/2021 13:50:12 -------------------------------------------------------------------------------- Encounter Discharge Information Details Patient Name: Date of Service: [name], [name] 11/14/2021 1:00 PM Medical Record Number: [id number] Patient Account Number: [id number] Date of Birth/Sex: Treating RN: 1941-07-12 (81 y.o. [name] Primary Care Jaquetta Currier: [name] Other Clinician: [name] Referring Nereida Schepp: Treating Lakota Schweppe/[name]:  Tressie Stalker in Treatment: 21 Encounter Discharge Information Items Discharge Condition: Stable Ambulatory Status: Ambulatory Discharge Destination: Home Transportation: Private Auto Accompanied By: self Schedule Follow-up Appointment: No Clinical Summary of Care: Electronic Signature(s) Signed: 11/14/2021 4:01:33 PM By: [name] CHT EMT BS , , Entered By: [name] on 11/14/2021 16:01:33 -------------------------------------------------------------------------------- Vitals Details Patient Name: Date of Service: [name], [name] 11/14/2021 1:00 PM Medical Record Number: [id number] Patient Account Number: [id number] Date of Birth/Sex: Treating RN: 1941/07/24 (81 y.o. [name] Primary Care Khylee Algeo: [name] Other Clinician: [name] Referring Valinda Fedie: Treating Cornelius Marullo/[name]: Tressie Stalker in Treatment: 21 Vital Signs Time Taken: 13:05 Temperature (F): 97.5 Height (in): 64 Pulse (bpm): 76 Weight (lbs): 135 Respiratory Rate (breaths/min): 16 Body Mass Index (BMI): 23.2 Blood Pressure (mmHg): 142/62 Capillary Blood Glucose (mg/dl): 126 Reference Range: 80 - 120 mg / dl Airway Pulse Oximetry (%): 100 Electronic Signature(s) Signed: 11/14/2021 5:47:05 PM By: [name] CHT EMT BS , , Entered By: [name] on 11/14/2021 13:50:38

## 2021-11-14 NOTE — Progress Notes (Addendum)
ROSELIND, KLUS ([id number]) Visit Report for 11/14/2021 SuperBill Details Patient Name: Date of Service: [name] 11/14/2021 Medical Record Number: [id number] Patient Account Number: [id number] Date of Birth/Sex: Treating RN: Feb 18, 1941 (81 y.o. 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, right ankle and foot Facility Procedures CPT4 Code Description Modifier Quantity [id number]-(Facility Use Only) HBOT full body chamber, 51min , 4 ICD-10 Diagnosis Description 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, right ankle and foot Physician Procedures Quantity CPT4 Code Description Modifier [id number] - WC PHYS HYPERBARIC OXYGEN THERAPY 1 ICD-10 Diagnosis Description 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

## 2021-11-15 ENCOUNTER — Encounter (HOSPITAL_BASED_OUTPATIENT_CLINIC_OR_DEPARTMENT_OTHER): Payer: Medicare Other | Admitting: Internal Medicine

## 2021-11-15 ENCOUNTER — Encounter (HOSPITAL_BASED_OUTPATIENT_CLINIC_OR_DEPARTMENT_OTHER): Payer: Medicare Other | Admitting: Physician Assistant

## 2021-11-15 ENCOUNTER — Other Ambulatory Visit: Payer: Self-pay

## 2021-11-15 DIAGNOSIS — E11621 Type 2 diabetes mellitus with foot ulcer: Secondary | ICD-10-CM | POA: Diagnosis not present

## 2021-11-15 DIAGNOSIS — L97512 Non-pressure chronic ulcer of other part of right foot with fat layer exposed: Secondary | ICD-10-CM | POA: Diagnosis not present

## 2021-11-15 DIAGNOSIS — L97428 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: Secondary | ICD-10-CM | POA: Diagnosis not present

## 2021-11-15 DIAGNOSIS — L97514 Non-pressure chronic ulcer of other part of right foot with necrosis of bone: Secondary | ICD-10-CM | POA: Diagnosis not present

## 2021-11-15 DIAGNOSIS — L97528 Non-pressure chronic ulcer of other part of left foot with other specified severity: Secondary | ICD-10-CM | POA: Diagnosis not present

## 2021-11-15 DIAGNOSIS — E11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: Secondary | ICD-10-CM | POA: Diagnosis not present

## 2021-11-15 DIAGNOSIS — M86671 Other chronic osteomyelitis, right ankle and foot: Secondary | ICD-10-CM | POA: Diagnosis not present

## 2021-11-15 LAB — GLUCOSE, CAPILLARY: Glucose-Capillary: 165 mg/dL — ABNORMAL HIGH (ref 70–99)

## 2021-11-15 NOTE — Progress Notes (Signed)
Rohner, Tristy H. ([id number]) °Visit Report for 11/15/2021 °Arrival Information Details °Patient Name: Date of Service: °[name], [name] 11/15/2021 12:30 PM °Medical Record Number: [id number] °Patient Account Number: [id number] °Date of Birth/Sex: Treating RN: °07/10/1941 (80 y.o. F) Scotton, Joanne °Primary Care Provider: Timberlake, [name] Other Clinician: °Referring Provider: °Treating Provider/[name]: [name], Michael °Timberlake, [name] °Weeks in Treatment: 21 °Visit Information History Since Last Visit °Added or deleted any medications: No °Patient Arrived: Ambulatory °Any new allergies or adverse reactions: No °Arrival Time: 12:49 °Had a fall or experienced change in No °Accompanied By: self °activities of daily living that may affect °Transfer Assistance: None °risk of falls: °Patient Identification Verified: Yes °Signs or symptoms of abuse/neglect since last visito No °Patient Requires Transmission-Based Precautions: No °Hospitalized since last visit: No °Patient Has Alerts: Yes °Implantable device outside of the clinic excluding No °Patient Alerts: Patient on Blood Thinner °cellular tissue based products placed in the center °L ABI: 0.82 °since last visit: °R ABI: 0.47 °Has Dressing in Place as Prescribed: Yes °Pain Present Now: No °Electronic Signature(s) °Signed: 11/15/2021 5:22:54 PM By: Scotton, Joanne RN °Entered By: Scotton, Joanne on 11/15/2021 12:54:07 °-------------------------------------------------------------------------------- °Encounter Discharge Information Details °Patient Name: Date of Service: °[name], [name] 11/15/2021 12:30 PM °Medical Record Number: [id number] °Patient Account Number: [id number] °Date of Birth/Sex: Treating RN: °07/27/1941 (80 y.o. F) Scotton, Joanne °Primary Care Provider: Timberlake, [name] Other Clinician: °Referring Provider: °Treating Provider/[name]: [name], Michael °Timberlake, [name] °Weeks in Treatment: 21 °Encounter Discharge Information Items Post Procedure  Vitals °Discharge Condition: Stable °Temperature (F): 97.8 °Ambulatory Status: Ambulatory °Pulse (bpm): 83 °Discharge Destination: Home °Respiratory Rate (breaths/min): 16 °Transportation: Private Auto °Blood Pressure (mmHg): 146/66 °Accompanied By: self °Schedule Follow-up Appointment: Yes °Clinical Summary of Care: Patient Declined °Electronic Signature(s) °Signed: 11/15/2021 5:22:54 PM By: Scotton, Joanne RN °Entered By: Scotton, Joanne on 11/15/2021 17:22:18 °-------------------------------------------------------------------------------- °Lower Extremity Assessment Details °Patient Name: °Date of Service: °[name], [name] 11/15/2021 12:30 PM °Medical Record Number: [id number] °Patient Account Number: [id number] °Date of Birth/Sex: °Treating RN: °01/07/1941 (80 y.o. F) Scotton, Joanne °Primary Care Provider: Timberlake, [name] °Other Clinician: °Referring Provider: °Treating Provider/[name]: [name], Michael °Timberlake, [name] °Weeks in Treatment: 21 °Edema Assessment °Assessed: [Left: No] [Right: No] °Edema: [Left: No] [Right: Yes] °Calf °Left: Right: °Point of Measurement: From Medial Instep 30.2 cm °Ankle °Left: Right: °Point of Measurement: From Medial Instep 23.1 cm °Electronic Signature(s) °Signed: 11/15/2021 5:22:54 PM By: Scotton, Joanne RN °Entered By: Scotton, Joanne on 11/15/2021 13:06:16 °-------------------------------------------------------------------------------- °Multi Wound Chart Details °Patient Name: °Date of Service: °[name], [name] 11/15/2021 12:30 PM °Medical Record Number: [id number] °Patient Account Number: [id number] °Date of Birth/Sex: °Treating RN: °01/14/1941 (80 y.o. 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 °o N/A °Primary Etiology: °Extremity °Coronary Artery Disease, Deep Vein Coronary Artery Disease, Deep Vein N/A °Comorbid History: °Thrombosis, Hypertension, Myocardial Thrombosis, Hypertension, Myocardial °Infarction, Peripheral Arterial Disease, °Infarction, Peripheral Arterial Disease, °Type II Diabetes Type II Diabetes °09/15/2021 11/15/2021 N/A °Date Acquired: °8 0 N/A °Weeks of Treatment: °Open Open N/A °Wound Status: °No No N/A °Wound Recurrence: °0.2x0.5x0.2 0.2x0.2x0.2 N/A °Measurements L x W x D (cm) °  0.079 0.031 N/A °A (cm²) : °rea °0.016 0.006 N/A °Volume (cm³) : °79.90% N/A N/A °% Reduction in Area: °86.40% N/A N/A °% Reduction in Volume: °Grade 3 Full Thickness Without Exposed N/A °Classification: °Support Structures °Medium Medium N/A °Exudate A mount: °Serosanguineous Serosanguineous N/A °Exudate Type: °red, brown red, brown N/A °Exudate Color: °Distinct, outline attached N/A N/A °Wound Margin: °Small (1-33%) Small (1-33%) N/A °Granulation Amount: °Pale Red N/A °Granulation Quality: °Large (67-100%) Large (67-100%) N/A °Necrotic Amount: °Adherent Slough Eschar N/A °Necrotic Tissue: °Fat Layer (Subcutaneous Tissue): Yes Fat Layer (Subcutaneous Tissue): Yes N/A °Exposed Structures: °Fascia: No °Fascia: No °Tendon: No °Tendon: No °Muscle: No °Muscle: No °Joint: No °Joint: No °Bone: No °Bone: No °Small (1-33%) None N/A °Epithelialization: °Treatment Notes °Electronic Signature(s) °Signed: 11/15/2021 4:06:18 PM By: [name], Michael MD °Entered By: [name], Michael on 11/15/2021 13:35:43 °-------------------------------------------------------------------------------- °Multi-Disciplinary Care Plan Details °Patient Name: °Date  of Service: °[name], [name] 11/15/2021 12:30 PM °Medical Record Number: [id number] °Patient Account Number: [id number] °Date of Birth/Sex: °Treating RN: °04/23/1941 (80 y.o. F) Scotton, Joanne °Primary Care Provider: Timberlake, [name] °Other Clinician: °Referring Provider: °Treating Provider/[name]: [name], Michael °Timberlake, [name] °Weeks in Treatment: 21 °Multidisciplinary Care Plan reviewed with physician °Active Inactive °Nutrition °Nursing Diagnoses: °Impaired glucose control: actual or potential °Potential for alteratiion in Nutrition/Potential for imbalanced nutrition °Goals: °Patient/caregiver agrees to and verbalizes understanding of need to use nutritional supplements and/or vitamins as prescribed °Date Initiated: 06/15/2021 °Date Inactivated: 09/20/2021 °Target Resolution Date: 09/23/2021 °Goal Status: Met °Patient/caregiver will maintain therapeutic glucose control °Date Initiated: 06/15/2021 °Target Resolution Date: 12/18/2021 °Goal Status: Active °Interventions: °Assess HgA1c results as ordered upon admission and as needed °Assess patient nutrition upon admission and as needed per policy °Provide education on elevated blood sugars and impact on wound healing °Provide education on nutrition °Treatment Activities: °Education provided on Nutrition : 06/15/2021 °Notes: °09/20/21: Glucose control ongoing. °Wound/Skin Impairment °Nursing Diagnoses: °Impaired tissue integrity °Knowledge deficit related to ulceration/compromised skin integrity °Goals: °Patient/caregiver will verbalize understanding of skin care regimen °Date Initiated: 06/15/2021 °Target Resolution Date: 12/18/2021 °Goal Status: Active °Interventions: °Assess patient/caregiver ability to obtain necessary supplies °Assess patient/caregiver ability to perform ulcer/skin care regimen upon admission and as needed °Assess ulceration(s) every visit °Provide education on ulcer and skin care °Notes: °09/20/21: Wound care regimen continues °Electronic  Signature(s) °Signed: 11/15/2021 5:22:54 PM By: Scotton, Joanne RN °Entered By: Scotton, Joanne on 11/15/2021 13:21:25 °-------------------------------------------------------------------------------- °Pain Assessment Details °Patient Name: °Date of Service: °[name], [name] 11/15/2021 12:30 PM °Medical Record Number: [id number] °Patient Account Number: [id number] °Date of Birth/Sex: °Treating RN: °11/24/1940 (80 y.o. F) Scotton, Joanne °Primary Care Provider: Timberlake, [name] °Other Clinician: °Referring Provider: °Treating Provider/[name]: [name], Michael °Timberlake, [name] °Weeks in Treatment: 21 °Active Problems °Location of Pain Severity and Description of Pain °Patient Has Paino No °Site Locations °Pain Management and Medication °Current Pain Management: °Electronic Signature(s) °Signed: 11/15/2021 5:22:54 PM By: Scotton, Joanne RN °Entered By: Scotton, Joanne on 11/15/2021 12:59:16 °-------------------------------------------------------------------------------- °Patient/Caregiver Education Details °Patient Name: °Date of Service: °[name], [name] 2/22/2023andnbsp12:30 PM °Medical Record Number: [id number] °Patient Account Number: [id number] °Date of Birth/Gender: °Treating RN: °05/21/1941 (80 y.o. F) Scotton, Joanne °Primary Care Physician: Timberlake, [name] °Other Clinician: °Referring Physician: °Treating Physician/[name]: [name], Michael °Timberlake, [name] °Weeks in Treatment: 21 °Education Assessment °Education Provided To: °Patient °Education Topics Provided °Wound/Skin Impairment: °Methods: Explain/Verbal °Responses: Return demonstration correctly °Electronic Signature(s) °Signed: 11/15/2021 5:22:54 PM By: Scotton, Joanne RN °Entered By: Scotton, Joanne on 11/15/2021 13:22:01 °-------------------------------------------------------------------------------- °Wound Assessment Details °Patient Name: °Date   of Service: °[name], [name] 11/15/2021 12:30 PM °Medical Record Number: [id number] °Patient  Account Number: [id number] °Date of Birth/Sex: °Treating RN: °05/18/1941 (80 y.o. 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 °Wound Open °Wounding Event: Other Lesion °Status: °Date Acquired: 09/15/2021 °Comorbid Coronary Artery Disease, Deep Vein Thrombosis, Hypertension, °Weeks Of Treatment: 8 °History: Myocardial Infarction, Peripheral Arterial Disease, Type II Diabetes °Clustered Wound: No °Photos °Wound Measurements °Length: (cm) 0.2 °Width: (cm) 0.5 °Depth: (cm) 0.2 °Area: (cm²) 0.079 °Volume: (cm³) 0.016 °% Reduction in Area: 79.9% °% Reduction in Volume: 86.4% °Epithelialization: Small (1-33%) °Tunneling: No °Undermining: No °Wound Description °Classification: Grade 3 °Wound Margin: Distinct, outline attached °Exudate Amount: Medium °Exudate Type: Serosanguineous °Exudate Color: red, brown °Foul Odor After Cleansing: No °Slough/Fibrino Yes °Wound Bed °Granulation Amount: Small (1-33%) Exposed Structure °Granulation Quality: Pale °Fascia Exposed: No °Necrotic Amount: Large (67-100%) °Fat Layer (Subcutaneous Tissue) Exposed: Yes °Necrotic Quality: Adherent Slough °Tendon Exposed: No °Muscle Exposed: No °Joint Exposed: No °Bone Exposed: No °Treatment Notes °Wound #15 (Amputation Site - Transmetatarsal) Wound Laterality: Right °Cleanser °Soap and Water °Discharge Instruction: May shower and wash wound with dial antibacterial soap and water prior to dressing change. °Wound Cleanser °Discharge Instruction: Cleanse the wound with wound cleanser prior to applying a clean dressing using gauze sponges, not tissue or cotton balls. °Peri-Wound Care °Topical °Primary Dressing °Promogran Prisma Matrix, 4.34 (sq in) (silver  collagen) °Discharge Instruction: Moisten collagen with [place] Jelly °Secondary Dressing °Woven Gauze Sponges 2x2 in °Discharge Instruction: Apply over primary dressing as directed. °Secured With °Kerlix Roll Sterile, 4.5x3.1 (in/yd) °Discharge Instruction: Secure with Kerlix as directed. °Transpore Surgical Tape, 2x10 (in/yd) °Discharge Instruction: Secure dressing with tape as directed. °Compression Wrap °Compression Stockings °Add-Ons °Electronic Signature(s) °Signed: 11/15/2021 5:22:54 PM By: Scotton, Joanne RN °Entered By: Scotton, Joanne on 11/15/2021 13:12:46 °-------------------------------------------------------------------------------- °Wound Assessment Details °Patient Name: °Date of Service: °[name], [name] 11/15/2021 12:30 PM °Medical Record Number: [id number] °Patient Account Number: [id number] °Date of Birth/Sex: °Treating RN: °01/28/1941 (80 y.o. F) Scotton, Joanne °Primary Care Provider: Timberlake, [name] °Other Clinician: °Referring Provider: °Treating Provider/[name]: [name], Michael °Timberlake, [name] °Weeks in Treatment: 21 °Wound Status °Wound Number: 19 Primary T be determined °o °Etiology: °Wound Location: Right, Distal Amputation Site - Transmetatarsal °Wound Open °Wounding Event: Gradually Appeared °Status: °Date Acquired: 11/15/2021 °Comorbid Coronary Artery Disease, Deep Vein Thrombosis, Hypertension, °Weeks Of Treatment: 0 °History: Myocardial Infarction, Peripheral Arterial Disease, Type II Diabetes °Clustered Wound: No °Photos °Wound Measurements °Length: (cm) 0.2 °Width: (cm) 0.2 °Depth: (cm) 0.2 °Area: (cm²) 0.031 °Volume: (cm³) 0.006 °% Reduction in Area: 0% °% Reduction in Volume: 0% °Epithelialization: None °Tunneling: No °Undermining: No °Wound Description °Classification: Full Thickness Without Exposed Support Str °Exudate Amount: Medium °Exudate Type: Serosanguineous °Exudate Color: red, brown °uctures °Wound Bed °Granulation Amount: Small (1-33%) Exposed Structure °Granulation  Quality: Red °Fascia Exposed: No °Necrotic Amount: Large (67-100%) °Fat Layer (Subcutaneous Tissue) Exposed: Yes °Necrotic Quality: Eschar °Tendon Exposed: No °Muscle Exposed: No °Joint Exposed: No °Bone Exposed: No °Treatment Notes °Wound #19 (Amputation Site - Transmetatarsal) Wound Laterality: Right, Distal °Cleanser °Soap and Water °Discharge Instruction: May shower and wash wound with dial antibacterial soap and water prior to dressing change. °Wound Cleanser °Discharge Instruction: Cleanse the wound with wound cleanser prior to applying a clean dressing using gauze sponges,   not tissue or cotton balls. Peri-Wound Care Topical Primary Dressing Promogran Prisma Matrix, 4.34 (sq in) (silver collagen) Discharge Instruction: Moisten collagen with [place] Jelly Secondary Dressing Woven Gauze Sponges 2x2 in Discharge Instruction: Apply over primary dressing as directed. Secured With [employer], 4.5x3.1 (in/yd) Discharge Instruction: Secure with Kerlix as directed. Transpore Surgical Tape, 2x10 (in/yd) Discharge Instruction: Secure dressing with tape as directed. Compression Wrap Compression Stockings Add-Ons Electronic Signature(s) Signed: 11/15/2021 5:22:54 PM By: Dellie Catholic RN Entered By: Dellie Catholic on 11/15/2021 13:37:55 -------------------------------------------------------------------------------- Vitals Details Patient Name: Date of Service: [name], [name] 11/15/2021 12:30 PM Medical Record Number: [id number] Patient Account Number: [id number] Date of Birth/Sex: Treating RN: December 05, 1940 (81 y.o. [name] Primary Care Camera Krienke: [name] Other Clinician: Referring Kyrianna Barletta: Treating Deon Duer/[name]: Tressie Stalker in Treatment: 21 Vital Signs Time Taken: 12:53 Temperature (F): 97.8 Height (in): 64 Pulse (bpm): 83 Weight (lbs): 135 Respiratory Rate (breaths/min): 16 Body Mass Index (BMI): 23.2 Blood Pressure  (mmHg): 146/66 Reference Range: 80 - 120 mg / dl Electronic Signature(s) Signed: 11/15/2021 5:22:54 PM By: Dellie Catholic RN Entered By: Dellie Catholic on 11/15/2021 13:52:07

## 2021-11-15 NOTE — Progress Notes (Addendum)
ENSLIE, SAHOTA ([id number]) Visit Report for 11/15/2021 Debridement Details Patient Name: Date of Service: [name] 11/15/2021 12:30 PM Medical Record Number: [id number] Patient Account Number: [id number] Date of Birth/Sex: Treating RN: 06/13/1941 (81 y.o. [name] Primary Care Provider: [name] Other Clinician: Referring Provider: Treating Provider/[name]: Tressie Stalker in Treatment: 21 Debridement Performed for Assessment: 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 Performed By: Physician Ricard Dillon., MD Debridement Type: Debridement Severity of Tissue Pre Debridement: Fat layer exposed Level of Consciousness (Pre-procedure): Awake and Alert Pre-procedure Verification/Time Out Yes - 13:26 Taken: Start Time: 13:26 T Area Debrided (L x W): otal 0.2 (cm) x 0.5 (cm) = 0.1 (cm) Tissue and other material debrided: Non-Viable, Eschar Level: Non-Viable Tissue Debridement Description: Selective/Open Wound Instrument: Curette Bleeding: Minimum Hemostasis Achieved: Pressure End Time: 13:29 Procedural Pain: 0 Post Procedural Pain: 0 Response to Treatment: Procedure was tolerated well Level of Consciousness (Post- Awake and Alert procedure): Post Debridement Measurements of Total Wound Length: (cm) 0.2 Width: (cm) 0.5 Depth: (cm) 0.2 Volume: (cm) 0.016 Character of Wound/Ulcer Post Debridement: Improved Severity of Tissue Post Debridement: Fat layer exposed Post Procedure Diagnosis Same as Pre-procedure Electronic Signature(s) Signed: 11/15/2021 4:06:18 PM By: [name] MD Signed: 11/15/2021 5:22:54 PM By: Dellie Catholic RN Entered By: Dellie Catholic on 11/15/2021 13:49:45 -------------------------------------------------------------------------------- Debridement Details Patient Name: Date of Service: [name], [name] 11/15/2021 12:30 PM Medical Record Number: [id number] Patient Account Number:  [id number] Date of Birth/Sex: Treating RN: [birth year] (81 y.o. [name] Primary Care Provider: [name] Other Clinician: Referring Provider: Treating Provider/[name]: Tressie Stalker in Treatment: 21 Debridement Performed for Assessment: Wound #19 Right,Distal Amputation Site - Transmetatarsal Performed By: Physician Ricard Dillon., MD Debridement Type: Debridement Level of Consciousness (Pre-procedure): Awake and Alert Pre-procedure Verification/Time Out Yes - 13:26 Taken: Start Time: 13:26 Pain Control: Other : Benzocaine 20% T Area Debrided (L x W): otal 0.2 (cm) x 0.2 (cm) = 0.04 (cm) Tissue and other material debrided: Non-Viable, Eschar Level: Non-Viable Tissue Debridement Description: Selective/Open Wound Instrument: Curette Bleeding: Minimum Hemostasis Achieved: Pressure End Time: 13:29 Procedural Pain: 0 Post Procedural Pain: 0 Response to Treatment: Procedure was tolerated well Level of Consciousness (Post- Awake and Alert procedure): Post Debridement Measurements of Total Wound Length: (cm) 0.2 Width: (cm) 0.2 Depth: (cm) 0.2 Volume: (cm) 0.006 Character of Wound/Ulcer Post Debridement: Improved Post Procedure Diagnosis Same as Pre-procedure Electronic Signature(s) Signed: 11/15/2021 4:06:18 PM By: [name] MD Signed: 11/15/2021 5:22:54 PM By: Dellie Catholic RN Entered By: Dellie Catholic on 11/15/2021 13:51:41 -------------------------------------------------------------------------------- HPI Details Patient Name: Date of Service: [name], [name] 11/15/2021 12:30 PM Medical Record Number: [id number] Patient Account Number: [id number] Date of Birth/Sex: Treating RN: August 31, 1941 (81 y.o. F) Primary Care Provider: [name] Other Clinician: Referring Provider: Treating Provider/[name]: Tressie Stalker in Treatment: 21 History of Present Illness HPI  Description: ADMISSION 07/13/2019 Patient is a 81 year old type II diabetic. She has known PAD. She has been followed by Dr. Jacqualyn Posey of podiatry for blistering areas on her toes dating back to 04/30/2019 which at this was the left fourth toe. By 8/11 she had wounds on the right and left second toes. She underwent arterial studies by Dr. Alvester Chou on 7/31 that showed ABIs on the right of 0.60 TBI of 0.26 on the left ABI of 0.56 and a TBI of 0.25. By 9/10 she had ischemic-looking wounds per Dr. Earleen Newport on the right  first, left first second and third. She has been using Medihoney and then mupirocin more recently simply Betadine. The patient underwent angiography by Dr. Gwenlyn Found on 9/21. 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 Noted that there was 1 vessel runoff in the foot via the peroneal. On the left there was an 80% left SFA, 70% mid left SFA. There was a short segment calcified CTO distal less than SFA and above-knee popliteal artery reconstituting with two-vessel runoff. 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 It was felt that she had bilateral SFA disease as well as tibial vessel disease. An attempt was made to revascularize the left SFA but they were unable to cross because of the highly calcified nature of the lesion. 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 She has an area on the medial left fourth toe with raised horned callus on top of this. I am not certain what this represents. With regards to pain she has about a 2-1/2 I will claudication tolerance in the grocery store. She has some pain in night which is relieved by putting her feet down over the side of the bed. She is wearing Nitro-Dur patches on the top of her right foot. Past medical history  includes type 2 diabetes with secondary PAD, neoplasm of the skin, diabetic retinopathy, carotid artery stenosis, hyperlipidemia hypertension. 11/2; the last time the patient was here I spoke to Dr. Gwenlyn Found about revascularization options on the right. As I understand things currently Dr. Gwenlyn Found spoke with Dr. Trula Slade and ultimately the patient was taken to surgery on 10/27. She had a right iliofemoral endarterectomy with a bovine patch angioplasty. I think the plan now is for her to have a staged intervention on the right SFA by Dr. Alvester Chou. Per the patient's understanding Dr. Gwenlyn Found and Dr. Jacqualyn Posey are waiting to see when the gangrenous toes on the right foot can be amputated. The patient states her pain is a lot better and she is grateful for that. 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 Small area on the left fourth toe. She is using Betadine to the mummified areas on the right and Medihoney on the left 08/27/2019; the last time I saw this patient I discharged her from the clinic. She had been revascularized by Dr. Trula Slade and she had a right iliofemoral endarterectomy with a bovine angioplasty. 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 Jacqualyn Posey of podiatry on 08/07/2019. I note that she also had a intervention by Dr. Gwenlyn Found and he performed a directional atherectomy and drug-coated balloon angioplasty of the SFA and popliteal artery on the right. I am not certain of the date of Dr. Kennon Holter procedure as of the time of this dictation. She was referred back to [place] by Dr. Earleen Newport predominantly for follow-up of the left fourth toe. She still has sutures and stitches in the right TMA site. She states her pain is a lot better. She expresses concern about the condition of the amputation site at the TMA. She is on doxycycline I think prescribed by Dr. Earleen Newport. She is complaining of some pain at night 12/10; I spoke to Dr. Jacqualyn Posey  last week. He removed the sutures on the  right foot on Monday of this week. She has the area on the left fourth toe just proximal to the PIP and then the right TMA site. She is still on doxycycline and has enough through next week. Unfortunately the TMA site does not look good at all. Both on the lateral and medial part of the incisions are areas that probe to bone. There is purulence over the medial part which I have cultured. We will use silver alginate. Left fourth toe looks somewhat better but there was still exposed bone 12/17; patient has an MRI booked for 12/30. Culture I did last week showed Pseudomonas Serratia and Enterococcus. This was purulent drainage coming out of the medial part of her amputation site. I use cefdinir 300 twice daily for 10 days that started on 12/15. Her x-ray on the right showed limited evaluation for osteomyelitis. The findings could have been postoperative. There was subtle erosion in the distal first and distal fifth metatarsal. An MRI was suggested. On the left she had irregularity of the fourth middle and proximal phalanx consistent with a history of osteomyelitis. I do not know that she has a history of osteomyelitis in this area. She had a newly defined area on the plantar third toe 12/31; patient's MRI is listed below: MRI OF THE RIGHT FOREFOOT WITHOUT AND WITH CONTRAST TECHNIQUE: Multiplanar, multisequence MR imaging of the right forefoot was performed before and after the administration of intravenous contrast. CONTRAST 6 mL GADAVIST IV SOLN : COMPARISON: Plain films right foot 09/04/2019. FINDINGS: Bones/Joint/Cartilage The patient is status post transmetatarsal amputation as seen on the prior exam. Marrow edema and enhancement are seen in all of the distal metatarsals. In the first metatarsal, signal change extends 1.5 cm proximal to the stump and in the second metatarsal extends approximately 2 cm proximal to the stump. Edema and enhancement  are seen in only the distal 0.7 cm of the third metatarsal stump and tips of the fourth and fifth metatarsals. Bone marrow signal is otherwise unremarkable. A small focus of subchondral edema is seen in the lateral talus, likely degenerative. Ligaments Intact. Muscles and Tendons No intramuscular fluid collection. Soft tissues Skin ulceration is seen off the stump of the first metatarsal. A thin fluid collection tracks deep to the wound and over the anterior metatarsals worrisome for small abscess. Intense subcutaneous edema and enhancement are seen diffusely. 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 Cellulitis about the foot. Skin ulceration over the distal first metatarsal is identified with a thin fluid collection tracking anteriorly along the stump worrisome for abscess. Small focus of subchondral edema in the lateral talus is likely degenerative. Electronically Signed By: Inge Rise M.D. On: 09/23/2019 15:25 Patient arrives in clinic today not complaining any of any pain. 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 She does not describe claudication however her activity is very limited. 10/01/2019; since the patient was last here I have communicated with Dr. Gwenlyn Found after bypass by Dr. Trula Slade and addressing the right superficial femoral artery he states that she is widely patent through peroneal arteries to the ankle with collaterals to the dorsalis pedis. He states he is going to talk to colleagues about the feasibility of tibial pedal access. The patient seems infectious disease later this afternoon Dr. Baxter Flattery. in preparation for this she has been off antibiotics for 1  week and I went ahead and obtain pieces of the remanence of her first metatarsal for  pathology and CandS. The patient is a candidate for hyperbaric oxygen. 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 1/15; considerable improvement in the wounds on her feet. We are using silver collagen. She follows with Dr. Baxter Flattery of infectious disease and is on meropenem and daptomycin. She has been taught how to do this herself at home. Follows with Dr. Graylon Good at the end of treatment here. She has 2 wounds on the surgical TMA site 1 lateral and 1 medial the lateral 1 has regressed tremendously. The area medially still has some exposed bone although the base of this looks healthy. 1/22; 2 separate wounds on the surgical TMA site. Both of these look satisfactory. The medial area does not have any exposed bone. This is an improvement On the left side fourth toe dorsally over the proximal phalanx there is a deep punched out area that probes to bone. She has an area on the tip of the left third toe. She also tells [place] that in HBO she traumatized her left Achilles and this is left her with a superficial wound area 1/28; weekly visit along with HBO. She has 5 wounds. T our punched out areas on the original TMA site on the right. Both of these appear to have contracted. o The area on the right no longer has exposed bone. She has an area on the tip of the left third toe and on the DIP of the left fourth toe. Both of these had surface callus that I removed and unfortunately they have small areas that both go to bone. She has a traumatic wound on the left Achilles area that happened in HBO and that appears better. She is tolerating her IV antibiotics well at home. She has home health changing the dressings and she is doing it once on the weekends. We have been using silver collagen. She has been extensively revascularized on the right by Dr. Trula Slade and subsequently by Dr. Gwenlyn Found. According to her she has severe PAD on the left but there was nothing that could be done  to revascularize this I will need to review these notes 2/5; the patient will see Dr. Baxter Flattery of infectious disease on 2/17. Dr. Gwenlyn Found on 2/23 and according to her her antibiotics stopped on 2/23. She is tolerating hyperbarics well. She has made a tremendous improvement in the right forefoot with only 2 smaller open wounds. Using silver collagen. On the left foot she has the area on the tip of the left third toe and the medial part of the left fourth toe. These had exposed bone last week I did not sense any of that today 2/12; sees Dr. Graylon Good on 2/17 and Dr. Gwenlyn Found on 2/23. We are going to use Dermagraft on this today however the lateral part of her train TMA incision on the right is healed and the medial part is down so much that we just continue with the silver collagen She has wounds on the tip of her left third and the medial aspect of the left fourth. Both of these still have exposed bone I have not been able to get these to epithelialize. 2/19; she sees Dr. Gwenlyn Found on 2/23 and I have communicated with him about the left vascular supply. Looking at her angiogram from 08/03/2019 it looks as though that they could not cross the left CFA. Noted that she had one-vessel runoff  bilaterally. She also apparently saw Dr. Graylon Good although I did not look up these results for preparation of this record. 2/26; Dr. Gwenlyn Found is going to do an angiogram next week on Wednesday. I think they are going to try to go at this both retrograde and anterograde to see if anything can be done to revascularize the left lower limb. She continues to make nice progress on the remaining wound on the right medial foot on her TMA site and the toe it wounds on the left are responding nicely as well 3/12; the patient underwent an extensive and complicated revascularization on the left leg by Dr. Fletcher Anon on 3/3; she had an atherectomy on occluded left popliteal artery; anterior tibial artery followed by a balloon angioplasty. Atherectomy was  also performed to the left SFA because there was still significant 50% stenosis in the left popliteal artery they performed an intravascular lithotripsy which improved the residual stenosis to 20%. The same lithotripsy was used to dilate the proximal left SFA. She had a drug-eluting stent placed I believe in the SFA. The patient returned for hyperbarics this week. She has had some eye problems on the right which she tells me are secondary to diabetic retinopathy and she saw her eye doctor. She is really made excellent progress there is no open wounds on the left foot at all. She has one open area medially and her TMA site on the right lateral wound is closed. 3/19; the patient comes in today with the area on the medial transmet on the right improved. Some debris removed from the surface revealed the still open wound. Unfortunately she now has the open area on the third toe tip and the medial aspect of the dorsal fourth toe. These are in the same location as her previous wounds. These had actually closed up last week. 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 She is undergone complex revascularizations on the right by Dr. Trula Slade and Dr. Gwenlyn Found and I believe Dr. 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 She completed IVantibiotics as directed by infectious disease and I believe a course of oral antibiotics as well.she underwent a complete 40 treatments of hyperbarics. She comes in today with the vast majority of the right TMA site healed there is a small opening on the most medial aspect. Her left third and left fourth toes appear to have epithelialization although this has fluctuated somewhat. 4/9; type 2  diabetes with severe diabetic angiopathy. She had a gaping wound on the right transmetatarsal amputation L as well as deep wounds on the left third and left fourth toes. She underwent complex revascularizations on her bilateral lower legs which I described on her last visit. She completed IV antibiotics by infectious disease and 40 treatments of hyperbarics. Her left third and fourth toes are healed. The area on the right transmetatarsal amputation site is also closed today. READMISSION 08/04/2020 [name] is now a 81 year old woman that we had for a complex stay in our clinic from October 2000 14 January 2020. 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 Since she was last in clinic she had an MRI in June of this year and underwent a CABG  on 03/24/20. Her current problems with wounds began on October 30 when she developed a spontaneous opening over the right transmet medial aspect over the first metatarsal head. She has been using collagen on this that she had leftover from her last stay in this clinic. She also has had an open area on the medial right calf from a vein harvest site from her CABG she. She said that this is never really closed since the vein harvest was done. With regards to her arterial status this is a major issue. She had an angiogram by Dr. Gwenlyn Found in September 2020. She developed a gangrenous right first and second toes. Ultimately she was referred to Dr. Trula Slade who did a right common femoral endarterectomy with patch angioplasty. This was on 07/21/2019 and she really had a good result. Dr. Gwenlyn Found did a atherectomy followed by a drug coated balloon angioplasty of her right SFA popliteal and tibial peroneal trunk on 08/03/2019 again with excellent excellent results. Follow-up.Dopplers in November revealed revealed a widely patent SFA and tibioperoneal trunk. UNFORTUNATELY her recent angiogram that was done on 08/01/2020 showed an 80% right  common femoral artery stenosis just above the previous endarterectomy site occluded right right SFA at the origin reconstituting the adductor canal by the profunda femoris collaterals. The profunda femoris is also diffusely diseased. There is and again another 80% tibioperoneal trunk stenosis and one-vessel runoff via the peroneal artery. She is seen Dr. Donzetta Matters and she is scheduled for a femoral endarterectomy and right femoral-popliteal bypass grafting with endarterectomy of her tibial peroneal trunk. The scheduled surgery is on 11/23 The patient does not complain of pain at rest. She does have 5-minute walking claudication. The wounds on her right first met head remanent was spontaneous she does not think she hit this. As mentioned in the area on her right medial calf is a vein harvest site Dr. Gwenlyn Found had texted me about getting her in the clinic and we have arranged this as soon as we can. I have told her that we will have to see how successful Dr. Donzetta Matters is before we know what can be done about the wounds in a precise fashion. Fortunately there does not seem to be any obvious infection involving the right foot although that may need to be looked at if things really deteriorate. She was treated with IV antibiotics and hyperbaric oxygen for her previous osteomyelitis 12/2; the patient underwent a right femoral endarterectomy and bypass femoral to popliteal artery. This was done on 08/16/2020. She is done remarkably well. She has a small area on the right anterior tibia and a small area in the medial part of her original right transmetatarsal amputation. She appears to have had an excellent response to surgery. 12/16; the patient's area on the right tibia and the small area on the medial part of her original right transmetatarsal amputation is totally healed READMISSION 06/15/2021 [name] is a now 81 year old woman who arrives for review of wounds on the left TMA site extending into her plantar foot  as well as an area on the left posterior calcaneus. 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 She was extensively revascularized on the right at that time as noted above in our previous notes ended up receiving IV antibiotics and hyperbaric oxygen therapy. Miraculously this TMA site actually closed and she really has had not any trouble with this since. UNFORTUNATELY the same cannot be  said of her left side. Her problem apparently started sometime in July when she hit her left third toe on a barstool while playing pool. She developed an acute infection which eventually led to underlying osteomyelitis. She underwent an extensive hospitalization from 04/27/2021 through 05/15/2021. She received IV antibiotics. Also extensive intervention by Dr. Stanford Breed of vein and vascular which included a left common femoral to tibioperoneal trunk bypass as well as a left third toe amputation on 05/03/2021. Unfortunately the area did not heal she ended up requiring a left TMA. By her description this was left open. She has been using a wound VAC on this to earlier this week and then was switched to Santyl. I think because of the left forefoot off loader she is also developed an area on the left posterior calcaneus. She is not complaining of a lot of pain. She has her daughter at home to change the dressings. She has been prescribed Santyl and she has a tube that she is brought into our clinic. I cannot get a lot of history of claudication although she is really not that mobile at this point walking with a walker. 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 She has had multiple interventions on the right side by vein and vascular and also by Dr. Gwenlyn Found as well as a transmetatarsal amputation on the  right. She has had follow-up noninvasive studies on 06/06/2021; on the left that showed an ABI of 0.47 with monophasic waveforms a dorsalis pedis ABI of 0.40 with monophasic waveforms. Her ANGIOGRAM which was initially done by Dr. Oneida Alar on 04/28/2021 showed severe left foot superficial femoral artery occlusive disease and an occlusion of the left popliteal artery with two-vessel runoff to the left foot with moderate to severe disease below the knee popliteal artery and tibial disease. She had 80% distal right external iliac artery above the existing femoral above-knee popliteal bypass but she does not have an open wound on the right foot She arrives in clinic with an extensive open area on the TMA site which in the mid aspect extends into the plantar foot. She also has a more superficial area on the upper part of her Achilles area of the left heel 9/29; patient arrives in clinic today with slough over the TMA site. We use MolecuLight to look at the surface of this suggesting cyan and white discoloration over a large part of this wound also extending into the third metatarsal area. She also has the area on the left posterior calcaneus. This is slough covered we will switch to [facility] here as well. There is some warmth in the area and some erythema we will give her antibiotics as well 10/6; completely necrotic surface once again on the left TMA site. We have been using Santyl and Hydrofera Blue. She has an area on the left posterior Achilles and actually states this has been more painful this week indeed there is some erythema around this area. She also describes I think claudication at rest which is relieved by dipping her left leg over the side of the bed at night. 10/13; our intake nurse was able to brush some slough off the surface of the left TMA hence this did not require debridement that is better than last week. The area heading towards the plantar part of her foot required an aggressive  debridement to clean this up. This is  quite a sizable divot but does not go to bone. The area on the right Achilles heel is also covered in a high 100% nonviable tissue we have been using Santyl and silver alginate 10/20; the area on the left TMA looks much better. Even the area is spreading into the midfoot looks improved. The right Achilles still is covered and nonviable surface 100%. We have been using Santyl and silver alginate and this will continue for this week The patient is approved for 5 Apligraf's and I think she is good enough to start using these next week 10/27; the patient's wounds actually look quite good. We have been using Santyl and silver alginate. We applied Apligraf #1 today Saw Dr. Stanford Breed her vascular surgeon on 10/25 he told her TMA was healing nicely. She will follow-up with them in December to repeat noninvasive testing. 08/16/2021 upon evaluation today patient appears to be doing much better in regard to her wounds. Both are showing signs of improvement which is great news. I am going to perform some debridement to clear away some of the necrotic debris currently this will just be a light debridement. Nonetheless other than that I feel like that the Apligraf is doing quite well working and reapply today and this will be #3 as far as the application is concerned. 12/20; since the patient was last here she was seen by Dr. Stanford Breed who felt that her Doppler flow was sluggish in the bypass. She therefore went underwent a urgent angiogram on 09/07/2021 by Dr. Carlis Abbott. She had an angioplasty of the distal anastomosis and the anterior tibial artery. She tolerated this well. She had no flow-limiting stenosis in the aortoiliac segment on the left. Some calcification in the left common iliac artery but it was not flow-limiting the common femoral and profunda were patent. The left lower extremity bypass had brisk flow with no significant proximal anastomosis on the common femoral artery.  Distally there was a high-grade stenosis where the bypass anastomosis was performed. This was angioplastied as well as the anterior tibial artery. 12/28; the patient comes in early because of a wound over the tip of her right first metatarsal head. She is not really sure how this happened. Says she noticed it when she took her sock off on Friday before she got in the shower. She has been using Hydrofera Blue to this area. This is the original wounded site we dealt with in the setting of a previous right TMA. The patient has PA I researched this on [facility] link. As it turns out she has had recent arterial studies noninvasive on 09/05/2021. On the right she had dampened monophasic waveforms at the popliteal absent waveforms at the PTA and dorsalis pedis. An ABI could not be obtained previously at 0.47. The comment states that they were unable to detect Doppler waveforms of the posterior tibial and dorsalis pedis arteries. 2023 09/26/2021; her original left TMA wound is almost closed. I reapplied Apligraf to the remaining area. The area on the plantar foot is also closed as far as I can tell HOWEVER she comes in with a second wound on the right first metatarsal head remanent both of these probe to bone. I did a bone scraping of this last week which only showed Corynebacterium unlikely to be a true pathogen. Nevertheless I am going to put her on Augmentin today probably for 2 weeks. X-ray will be done today. She has an appointment with Dr. Gwenlyn Saran at vein and vascular next Tuesday I will send  him a note about my concerns about the right TM 1/10; we inadvertently remove the Apligraf from the left surgical site however everything looks closed here. She still has 2 probing wounds over the remanent of her first metatarsal head both go to bone although there is less erythema and swelling in the area. She does not have an appointment with Dr. Donzetta Matters until next Tuesday. I am going to go ahead and order an MRI of  the right foot I had originally planned to have or at least consider HBO if the patient has underlying osteomyelitis in the right foot however she tells me that she lost a significant amount of her visual field during her last run with HBO in the right eye nasal aspect. She saw Dr. Zadie Rhine who is a retinal specialist I will see if I can look at his records. She is still on Augmentin as finishes on Monday. 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 She has been on Augmentin with some improvement in the surrounding erythema although the small wound on the tip and the plantar aspect still probe easily to bone and there does not appear to be any viable tissue. UNFORTUNATELY arrives in clinic today with new areas on the right lateral foot neither 1 of these look particularly healthy. No debridement. The patient has an appoint with Dr. Donzetta Matters this morning my question is angiography of the right lego. I also wonder about an infectious disease appointment and I will continue her Augmentin but refer her for consideration. I did not get a culture that was positive even from a bone scraping however. 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 She saw her retina doctor Dr. Deloria Lair and her interpretation of this was that the hyperbarics may have contributed to a blockage. I wonder whether this was a central retinal vein or artery occlusion. I am not sure that Dr. Zadie Rhine documents in epic I will need to see if I can find his note 1/25; this is [name] returns to clinic having a very eventful week. Firstly she underwent angiography by  Dr. Stanford Breed of vein and vascular.. Her preintervention ABI was 0. She underwent a right iliofemoral drug-coated balloon angioplasty, right popliteal drug-coated balloon angioplasty and a right tibial peroneal trunk and peroneal angioplasty by Dr. Stanford Breed. She tolerated this well. She definitely has better blood flow to the right foot. She saw Dr. Doren Custard in follow-up who felt her Doppler flow in the right foot was good. An x-ray did not show any evidence of osteomyelitis however I noted a previous MRI certainly did. She also saw infectious diseases been started on IV daptomycin and ertapenem. She had the PICC line placed. I had started her on Augmentin on February 3 and that had some improvement in the erythema and swelling over the remanent of her first metatarsal head. As far as I am aware she did not have a specific organism cultured at least not by me although I did try. Everything on the left is closed including the left transmit amputation site and the area on her left posterior heel/Achilles. We are using silver collagen  on the right foot now which has 2 open wounds on the right first metatarsal head and the right fifth metatarsal head 2/1; the patient's left foot transmit site and the area on her heel remain closed. Some eschar but I do not think this requires debridement She has areas on the right first and right fifth met heads underlying osteomyelitis she is on IV daptomycin and ertapenem. The areas are small but probe to bone. 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 Her right transmetatarsal  amputation site appears less erythematous and less swollen however the wound on the first met head probes to bone. The bone itself does not feel that vibrant. The wounds on the fifth metatarsal head also on the right have eschar on the surface I did not consider debriding this today. 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 Dr. 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 We will therefore pursue hyperbaric oxygen therapy and treatment of the underlying refractory osteomyelitis 2/15; the patient's right foot actually looks a lot better 2 areas over the fifth met head are closed. 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 She is on IV antibiotics and has been approved for hyperbarics. In the meantime the whole right first met head remanent looks a lot better with antibiotics less swelling and less erythema but still with the open area probing to bone 2/22; saw Dr. Luan Pulling and then intravascular yesterday. She apparently had her arterial studies done. I have not reviewed these today however he stated he was well satisfied. She is still on IV antibiotics and tolerating hyperbaric oxygen well. Her largest wound is a small open area of the probes the bone over her right fifth metatarsal head remnant. She's been using silver collagen Electronic Signature(s) Signed: 11/15/2021 4:06:18 PM By: [name] MD Entered By: [name] on 11/15/2021 13:37:41 -------------------------------------------------------------------------------- Physical Exam Details Patient Name: Date of Service: [name], [name]. 11/15/2021 12:30 PM Medical Record Number: [id number] Patient Account Number:  [id number] Date of Birth/Sex: Treating RN: [birth year] (81 y.o. F) Primary Care Provider: [name] Other Clinician: Referring Provider: Treating Provider/[name]: Tressie Stalker in Treatment: 21 Constitutional Pulse regular and within target range for patient.. Temperature is normal and within the target range for the patient.. Notes would exam; right first metatarsal head remnant. The major albeit small wound on this area probed to bone but I'm having difficulty proving that today. I used a #3 curet to remove eschar and debris from the surface rings didn't look too bad and it looks like it's contracting. HOWEVER the patient has a new injury below this which looks like a hemorrhagic callus. A used to #3 curet to remove this and underneath that S tissue looks viable however we'll have to see how that goes. There is  no evidence of infection in this area with marked improvement with IV antibiotics and hyperbaric oxygen the tissue is no longer erythematous and much less swollen Electronic Signature(s) Signed: 11/15/2021 4:06:18 PM By: [name] MD Entered By: [name] on 11/15/2021 13:40:42 -------------------------------------------------------------------------------- Physician Orders Details Patient Name: Date of Service: [name], [name]. 11/15/2021 12:30 PM Medical Record Number: [id number] Patient Account Number: [id number] Date of Birth/Sex: Treating RN: August 14, 1941 (81 y.o. 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 - with Dr. Dellia Nims - 1230 prior to HBO Return A Bathing/ Shower/ Hygiene May shower with protection but do not get wound dressing(s) wet. Edema Control - Lymphedema / SCD / Other Elevate legs to the level of the heart or above for 30 minutes daily and/or when sitting, a frequency of: - throughout the day Avoid standing for long periods of time. Home Health No change in wound care orders this week; continue Home Health for wound care. May utilize formulary equivalent dressing for wound treatment orders unless otherwise specified. Dressing changes to be completed by [place] on Monday / Wednesday / Friday except when patient has scheduled visit at [facility]. 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 Follow Hyperbaric Oxygen Glycemia Protocol Afrin (Oxymetazoline HCL) 0.05% nasal spray - 1 spray in both nostrils daily as needed prior to HBO treatment for difficulty clearing ears Wound Treatment Wound #15 - Amputation Site - Transmetatarsal Wound Laterality: Right Cleanser: Soap and Water Every Other Day/30 Days Discharge Instructions: May shower and wash wound with dial antibacterial soap and water prior to dressing change. Cleanser: Wound Cleanser [facility]) Every Other Day/30 Days Discharge Instructions: Cleanse the wound with wound cleanser prior to applying a clean dressing using gauze sponges, not  tissue or cotton balls. Prim Dressing: Promogran Prisma Matrix, 4.34 (sq in) (silver collagen) (Home Health) Every Other Day/30 Days ary Discharge Instructions: Moisten collagen with [place] Jelly Secondary Dressing: Woven Gauze Sponges 2x2 in [facility]) Every Other Day/30 Days Discharge Instructions: Apply over primary dressing as directed. Secured With: [employer], 4.5x3.1 (in/yd) [facility]) Every Other Day/30 Days Discharge Instructions: Secure with Kerlix as directed. Secured With: Transpore Surgical Tape, 2x10 (in/yd) [facility]) Every Other Day/30 Days Discharge Instructions: Secure dressing with tape as directed. Wound #19 - Amputation Site - Transmetatarsal Wound Laterality: Right, Distal Cleanser: Soap and Water Every Other Day/30 Days Discharge Instructions: May shower and wash wound with dial antibacterial soap and water prior to dressing change. Cleanser: Wound Cleanser [facility]) Every Other Day/30 Days Discharge Instructions: Cleanse the wound with wound cleanser prior to applying a clean dressing using gauze sponges, not tissue or cotton balls. Prim Dressing: Promogran Prisma  Matrix, 4.34 (sq in) (silver collagen) (Home Health) Every Other Day/30 Days ary Discharge Instructions: Moisten collagen with [place] Jelly Secondary Dressing: Woven Gauze Sponges 2x2 in [facility]) Every Other Day/30 Days Discharge Instructions: Apply over primary dressing as directed. Secured With: [employer], 4.5x3.1 (in/yd) [facility]) Every Other Day/30 Days Discharge Instructions: Secure with Kerlix as directed. Secured With: Transpore Surgical Tape, 2x10 (in/yd) [facility]) Every Other Day/30 Days Discharge Instructions: Secure dressing with tape as directed. GLYCEMIA INTERVENTIONS PROTOCOL PRE-HBO GLYCEMIA INTERVENTIONS ACTION INTERVENTION Obtain pre-HBO capillary blood glucose (ensure 1 physician order is in chart). A. Notify HBO physician and await  physician orders. 2 If result is 70 mg/dl or below: B. If the result meets the hospital definition of a critical result, follow hospital policy. A. Give patient an 8 ounce Glucerna Shake, an 8 ounce Ensure, or 8 ounces of a Glucerna/Ensure equivalent dietary supplement*. B. Wait 30 minutes. If result is 71 mg/dl to 130 mg/dl: C. Retest patients capillary blood glucose (CBG). D. If result greater than or equal to 110 mg/dl, proceed with HBO. If result less than 110 mg/dl, notify HBO physician and consider holding HBO. If result is 131 mg/dl to 249 mg/dl: A. Proceed with HBO. A. Notify HBO physician and await physician orders. B. It is recommended to hold HBO and do If result is 250 mg/dl or greater: blood/urine ketone testing. C. If the result meets the hospital definition of a critical result, follow hospital policy. POST-HBO GLYCEMIA INTERVENTIONS ACTION INTERVENTION Obtain post HBO capillary blood glucose (ensure 1 physician order is in chart). A. Notify HBO physician and await physician orders. 2 If result is 70 mg/dl or below: B. If the result meets the hospital definition of a critical result, follow hospital policy. A. Give patient an 8 ounce Glucerna Shake, an 8 ounce Ensure, or 8 ounces of a Glucerna/Ensure equivalent dietary supplement*. B. Wait 15 minutes for symptoms of If result is 71 mg/dl to 100 mg/dl: hypoglycemia (i.e. nervousness, anxiety, sweating, chills, clamminess, irritability, confusion, tachycardia or dizziness). C. If patient asymptomatic, discharge patient. If patient symptomatic, repeat capillary blood glucose (CBG) and notify HBO physician. If result is 101 mg/dl to 249 mg/dl: A. Discharge patient. A. Notify HBO physician and await physician orders. B. It is recommended to do blood/urine ketone If result is 250 mg/dl or greater: testing. C. If the result meets the hospital definition of a critical result, follow hospital  policy. *Juice or candies are NOT equivalent products. If patient refuses the Glucerna or Ensure, please consult the hospital dietitian for an appropriate substitute. Electronic Signature(s) Signed: 11/15/2021 4:06:18 PM By: [name] MD Signed: 11/15/2021 5:22:54 PM By: Dellie Catholic RN Entered By: Dellie Catholic on 11/15/2021 13:46:57 -------------------------------------------------------------------------------- Problem List Details Patient Name: Date of Service: [name], [name] 11/15/2021 12:30 PM Medical Record Number: [id number] Patient Account Number: [id number] Date of Birth/Sex: Treating RN: 21-Apr-1941 (81 y.o. F) Primary Care Provider: [name] Other Clinician: Referring Provider: Treating Provider/[name]: Tressie Stalker in Treatment: 21 Active Problems ICD-10 Encounter Code Description Active Date MDM Diagnosis E11.621 Type 2 diabetes mellitus with foot ulcer 06/15/2021 No Yes T81.31XD Disruption of external operation (surgical) wound, not elsewhere classified, 06/15/2021 No Yes subsequent encounter L97.514 Non-pressure chronic ulcer of other part of right foot with necrosis of bone 09/20/2021 No Yes E11.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 06/15/2021 No Yes 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, right ankle and foot 10/25/2021 No Yes Inactive  Problems ICD-10 Code Description Active Date Inactive Date L97.428 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 11/15/2021 12:30 PM Medical Record Number: [id number] Patient Account Number: [id number] Date of Birth/Sex: Treating RN: 1941-09-18 (81 y.o. F) Primary Care Provider: [name] Other Clinician: Referring Provider: Treating Provider/[name]: Tressie Stalker in Treatment: 21 Subjective History of Present Illness (HPI) ADMISSION 07/13/2019 Patient is a 81 year old type II diabetic. She has known PAD. She has been followed by Dr. Jacqualyn Posey of podiatry for blistering areas on her toes dating back to 04/30/2019 which at this was the left fourth toe. By 8/11 she had wounds on the right and left second toes. She underwent arterial studies by Dr. Alvester Chou on 7/31 that showed ABIs on the right of 0.60 TBI of 0.26 on the left ABI of 0.56 and a TBI of 0.25. By 9/10 she had ischemic-looking wounds per Dr. Earleen Newport on the right first, left first second and third. She has been using Medihoney and then mupirocin more recently simply Betadine. The patient underwent angiography by Dr. Gwenlyn Found on 9/21. 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 Noted that there was 1 vessel runoff in the foot via the peroneal. ooOn the left there was an 80% left SFA, 70% mid left SFA. There was a short segment calcified CTO distal less than SFA and above-knee popliteal artery reconstituting with two-vessel runoff. 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 It was felt that she had bilateral SFA disease as well as tibial vessel disease. An attempt was made to revascularize the left SFA but they were unable to cross because of the highly calcified nature of the lesion. 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 She has an  area on the medial left fourth toe with raised horned callus on top of this. I am not certain what this represents. With regards to pain she has about a 2-1/2 I will claudication tolerance in the grocery store. She has some pain in night which is relieved by putting her feet down over the side of the bed. She is wearing Nitro-Dur patches on the top of her right foot. Past medical history includes type 2 diabetes with secondary PAD, neoplasm of the skin, diabetic retinopathy, carotid artery stenosis, hyperlipidemia hypertension. 11/2; the last time the patient was here I spoke to Dr. Gwenlyn Found about revascularization options on the right. As I understand things currently Dr. Gwenlyn Found spoke with Dr. Trula Slade and ultimately the patient was taken to surgery on 10/27. She had a right iliofemoral endarterectomy with a bovine patch angioplasty. I think the plan now is for her to have a staged intervention on the right SFA by Dr. Alvester Chou. Per the patient's understanding Dr. Gwenlyn Found and Dr. Jacqualyn Posey are waiting to see when the gangrenous toes on the right foot can be amputated. The patient states her pain is a lot better and she is grateful for that. 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 Small area on the left fourth toe. She is using Betadine to the mummified areas on the right and Medihoney on the left 08/27/2019; the last time I saw this patient I discharged her from the clinic. She had been revascularized by Dr. Trula Slade and she had a right iliofemoral endarterectomy with a bovine angioplasty. 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 Jacqualyn Posey of podiatry on 08/07/2019. I note that she also had a intervention by Dr. Gwenlyn Found and he performed a directional atherectomy and drug-coated balloon angioplasty of the SFA and popliteal artery on the right. I am not certain of the date of Dr. Kennon Holter procedure as of the time of this dictation. She  was referred back to [place] by Dr. Earleen Newport predominantly for follow-up of the left fourth toe. She still has sutures and stitches in the right TMA site. She states her pain is a lot better. She expresses concern about the condition of the amputation site at the TMA. She is on doxycycline I think prescribed by Dr. Earleen Newport. She is complaining of some pain at night 12/10; I spoke to Dr. Jacqualyn Posey last week. He removed the sutures on the right foot on Monday of this week. She has the area on the left fourth toe just proximal to the PIP and then the right TMA site. She is still on doxycycline and has enough through next week. Unfortunately the TMA site does not look good at all. Both on the lateral and medial part of the incisions are areas that probe to bone. There is purulence over the medial part which I have cultured. We will use silver alginate. Left fourth toe looks somewhat better but there was still exposed bone 12/17; patient has an MRI booked for 12/30. Culture I did last week showed Pseudomonas Serratia and Enterococcus. This was purulent drainage coming out of the medial part of her amputation site. I use cefdinir 300 twice daily for 10 days that started on 12/15. Her x-ray on the right showed limited evaluation for osteomyelitis. The findings could have been postoperative. There was subtle erosion in the distal first and distal fifth metatarsal. An MRI was suggested. On the left she had irregularity of the fourth middle and proximal phalanx consistent with a history of osteomyelitis. I do not know that she has a history of osteomyelitis in this area. She had a newly defined area on the plantar third toe 12/31; patient's MRI is listed below: MRI OF THE RIGHT FOREFOOT WITHOUT AND WITH CONTRAST TECHNIQUE: Multiplanar, multisequence MR imaging of the right forefoot was performed before and after the administration of intravenous contrast. CONTRAST 6 mL GADAVIST IV SOLN : COMPARISON: Plain films  right foot 09/04/2019. FINDINGS: Bones/Joint/Cartilage The patient is status post transmetatarsal amputation as seen on the prior exam. Marrow edema and enhancement are seen in all of the distal metatarsals. In the first metatarsal, signal change extends 1.5 cm proximal to the stump and in the second metatarsal extends approximately 2 cm proximal to the stump. Edema and enhancement are seen in only the distal 0.7 cm of the third metatarsal stump and tips of the fourth and fifth metatarsals. Bone marrow signal is otherwise unremarkable. A small focus of subchondral edema is seen in the lateral talus, likely degenerative. Ligaments Intact. Muscles and Tendons No intramuscular fluid collection. Soft tissues Skin ulceration is seen off the stump of the first metatarsal. A thin fluid collection tracks deep to  the wound and over the anterior metatarsals worrisome for small abscess. Intense subcutaneous edema and enhancement are seen diffusely. 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 Cellulitis about the foot. Skin ulceration over the distal first metatarsal is identified with a thin fluid collection tracking anteriorly along the stump worrisome for abscess. Small focus of subchondral edema in the lateral talus is likely degenerative. Electronically Signed By: Inge Rise M.D. On: 09/23/2019 15:25 Patient arrives in clinic today not complaining any of any pain. 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 She does not describe claudication however her activity is very limited. 10/01/2019; since the patient was last here I have communicated with Dr. Gwenlyn Found after bypass by Dr. Trula Slade and addressing the right superficial femoral artery he states that she is widely  patent through peroneal arteries to the ankle with collaterals to the dorsalis pedis. He states he is going to talk to colleagues about the feasibility of tibial pedal access. The patient seems infectious disease later this afternoon Dr. Baxter Flattery. in preparation for this she has been off antibiotics for 1 week and I went ahead and obtain pieces of the remanence of her first metatarsal for pathology and CandS. The patient is a candidate for hyperbaric oxygen. 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 1/15; considerable improvement in the wounds on her feet. We are using silver collagen. She follows with Dr. Baxter Flattery of infectious disease and is on meropenem and daptomycin. She has been taught how to do this herself at home. Follows with Dr. Graylon Good at the end of treatment here. She has 2 wounds on the surgical TMA site 1 lateral and 1 medial the lateral 1 has regressed tremendously. The area medially still has some exposed bone although the base of this looks healthy. 1/22; 2 separate wounds on the surgical TMA site. Both of these look satisfactory. The medial area does not have any exposed bone. This is an improvement On the left side fourth toe dorsally over the proximal phalanx there is a deep punched out area that probes to bone. She has an area on the tip of the left third toe. She also tells [place] that in HBO she traumatized her left Achilles and this is left her with a superficial wound area 1/28; weekly visit along with HBO. She has 5 wounds. T our punched out areas on the original TMA site on the right. Both of these appear to have contracted. o The area on the right no longer has exposed bone. She has an area on the tip of the left third toe and on the DIP of the left fourth toe. Both of these had surface callus that I removed and unfortunately they have small areas that both go to bone. She has a traumatic wound on the left Achilles area that happened in  HBO and that appears better. She is tolerating her IV antibiotics well at home. She has home health changing the dressings and she is doing it once on the weekends. We have been using silver collagen. She has been extensively revascularized on the right by Dr. Trula Slade and subsequently by Dr. Gwenlyn Found. 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 Baxter Flattery of infectious disease on 2/17. Dr. Gwenlyn Found on 2/23 and according to her her antibiotics stopped on 2/23. She is tolerating hyperbarics well. She has made a tremendous improvement in the right forefoot with only 2 smaller open wounds. Using silver collagen. On the left foot she has the area on the tip of the left third toe and the medial part of the left fourth toe. These had exposed bone last week I did not sense any of that today 2/12; sees Dr. Graylon Good on 2/17 and Dr. Gwenlyn Found on 2/23. We are going to use Dermagraft on this today however the lateral part of her train TMA incision on the right is healed and the medial part is down so much that we just continue with the silver collagen She has wounds on the tip of her left third and the medial aspect of the left fourth. Both of these still have exposed bone I have not been able to get these to epithelialize. 2/19; she sees Dr. Gwenlyn Found on 2/23 and I have communicated with him about the left vascular supply. Looking at her angiogram from 08/03/2019 it looks as though that they could not cross the left CFA. Noted that she had one-vessel runoff bilaterally. She also apparently saw Dr. Graylon Good although I did not look up these results for preparation of this record. 2/26; Dr. Gwenlyn Found is going to do an angiogram next week on Wednesday. I think they are going to try to go at this both retrograde and anterograde to see if anything can be done to revascularize the left lower limb. She continues to  make nice progress on the remaining wound on the right medial foot on her TMA site and the toe it wounds on the left are responding nicely as well 3/12; the patient underwent an extensive and complicated revascularization on the left leg by Dr. Fletcher Anon on 3/3; she had an atherectomy on occluded left popliteal artery; anterior tibial artery followed by a balloon angioplasty. Atherectomy was also performed to the left SFA because there was still significant 50% stenosis in the left popliteal artery they performed an intravascular lithotripsy which improved the residual stenosis to 20%. The same lithotripsy was used to dilate the proximal left SFA. She had a drug-eluting stent placed I believe in the SFA. The patient returned for hyperbarics this week. She has had some eye problems on the right which she tells me are secondary to diabetic retinopathy and she saw her eye doctor. She is really made excellent progress there is no open wounds on the left foot at all. She has one open area medially and her TMA site on the right lateral wound is closed. 3/19; the patient comes in today with the area on the medial transmet on the right improved. Some debris removed from the surface revealed the still open wound. ooUnfortunately she now has the open area on the third toe tip and the medial aspect of the dorsal fourth toe. These are in the same location as her previous wounds. These had actually closed up last week. 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 She is undergone complex revascularizations on the right by Dr. Trula Slade and Dr. Gwenlyn Found and I believe Dr. Fletcher Anon worked on the tibial vessels on the left most recently.she had a complex transmetatarsal amputation wound with underlying osteomyelitis on presentation to the clinic and then developed deep wounds on the plantar left third toe tip and on the medial part  of the left fourth toe at the PIP. She completed  IVantibiotics as directed by infectious disease and I believe a course of oral antibiotics as well.she underwent a complete 40 treatments of hyperbarics. She comes in today with the vast majority of the right TMA site healed there is a small opening on the most medial aspect. Her left third and left fourth toes appear to have epithelialization although this has fluctuated somewhat. 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 She had a gaping wound on the right transmetatarsal amputation L as well as deep wounds on the left third and left fourth toes. She underwent complex revascularizations on her bilateral lower legs which I described on her last visit. She completed IV antibiotics by infectious disease and 40 treatments of hyperbarics. Her left third and fourth toes are healed. The area on the right transmetatarsal amputation site is also closed today. READMISSION 08/04/2020 [name] is now a 81 year old woman that we had for a complex stay in our clinic from October 2000 14 January 2020. 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 Her current problems with wounds began on October 30 when she developed a spontaneous opening over the right transmet medial aspect over the first metatarsal head. She has been using collagen on this that she had leftover from her last stay in this clinic. She also has had an open area on the medial right calf from a vein harvest site from her CABG she. She said that this is never really closed since the vein harvest was done. With regards to her arterial status this is a major issue. She had an angiogram by Dr. Gwenlyn Found in September 2020. She developed a gangrenous right first and second toes. Ultimately she was referred to Dr. Trula Slade who did a right common femoral endarterectomy with patch angioplasty. This was on  07/21/2019 and she really had a good result. Dr. Gwenlyn Found did a atherectomy followed by a drug coated balloon angioplasty of her right SFA popliteal and tibial peroneal trunk on 08/03/2019 again with excellent excellent results. Follow-up.Dopplers in November revealed revealed a widely patent SFA and tibioperoneal trunk. UNFORTUNATELY her recent angiogram that was done on 08/01/2020 showed an 80% right common femoral artery stenosis just above the previous endarterectomy site occluded right right SFA at the origin reconstituting the adductor canal by the profunda femoris collaterals. The profunda femoris is also diffusely diseased. There is and again another 80% tibioperoneal trunk stenosis and one-vessel runoff via the peroneal artery. She is seen Dr. Donzetta Matters and she is scheduled for a femoral endarterectomy and right femoral-popliteal bypass grafting with endarterectomy of her tibial peroneal trunk. The scheduled surgery is on 11/23 The patient does not complain of pain at rest. She does have 5-minute walking claudication. The wounds on her right first met head remanent was spontaneous she does not think she hit this. As mentioned in the area on her right medial calf is a vein harvest site Dr. Gwenlyn Found had texted me about getting her in the clinic and we have arranged this as soon as we can. I have told her that we will have to see how successful Dr. Donzetta Matters is before we know what can be done about the wounds in a precise fashion. Fortunately there does not seem to be any obvious infection involving the right foot although that  may need to be looked at if things really deteriorate. She was treated with IV antibiotics and hyperbaric oxygen for her previous osteomyelitis 12/2; the patient underwent a right femoral endarterectomy and bypass femoral to popliteal artery. This was done on 08/16/2020. She is done remarkably well. She has a small area on the right anterior tibia and a small area in the medial part of her  original right transmetatarsal amputation. She appears to have had an excellent response to surgery. 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 Zahradnik is a now 81 year old woman who arrives for review of wounds on the left TMA site extending into her plantar foot as well as an area on the left posterior calcaneus. 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 She was extensively revascularized on the right at that time as noted above in our previous notes ended up receiving IV antibiotics and hyperbaric oxygen therapy. Miraculously this TMA site actually closed and she really has had not any trouble with this since. UNFORTUNATELY the same cannot be said of her left side. Her problem apparently started sometime in July when she hit her left third toe on a barstool while playing pool. She developed an acute infection which eventually led to underlying osteomyelitis. She underwent an extensive hospitalization from 04/27/2021 through 05/15/2021. She received IV antibiotics. Also extensive intervention by Dr. Stanford Breed of vein and vascular which included a left common femoral to tibioperoneal trunk bypass as well as a left third toe amputation on 05/03/2021. Unfortunately the area did not heal she ended up requiring a left TMA. By her description this was left open. She has been using a wound VAC on this to earlier this week and then was switched to Santyl. I think because of the left forefoot off loader she is also developed an area on the left posterior calcaneus. She is not complaining of a lot of pain. She has her daughter at home to change the dressings. She has been prescribed Santyl and she has a tube that she is brought into our clinic. I cannot get a lot of history of  claudication although she is really not that mobile at this point walking with a walker. 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 She has had multiple interventions on the right side by vein and vascular and also by Dr. Gwenlyn Found as well as a transmetatarsal amputation on the right. She has had follow-up noninvasive studies on 06/06/2021; on the left that showed an ABI of 0.47 with monophasic waveforms a dorsalis pedis ABI of 0.40 with monophasic waveforms. Her ANGIOGRAM which was initially done by Dr. Oneida Alar on 04/28/2021 showed severe left foot superficial femoral artery occlusive disease and an occlusion of the left popliteal artery with two-vessel runoff to the left foot with moderate to severe disease below the knee popliteal artery and tibial disease. She had 80% distal right external iliac artery above the existing femoral above-knee popliteal bypass but she does not have an open wound on the right foot She arrives in clinic with an extensive open area on the TMA site which in the mid aspect extends into the plantar foot. She also has a more superficial  area on the upper part of her Achilles area of the left heel 9/29; patient arrives in clinic today with slough over the TMA site. We use MolecuLight to look at the surface of this suggesting cyan and white discoloration over a large part of this wound also extending into the third metatarsal area. She also has the area on the left posterior calcaneus. This is slough covered we will switch to [facility] [place] here as well. There is some warmth in the area and some erythema we will give her antibiotics as well 10/6; completely necrotic surface once again on the left TMA site. We have been using Santyl and Hydrofera Blue. She has an area on the left posterior Achilles and actually states this has been more painful this  week indeed there is some erythema around this area. She also describes I think claudication at rest which is relieved by dipping her left leg over the side of the bed at night. 10/13; our intake nurse was able to brush some slough off the surface of the left TMA hence this did not require debridement that is better than last week. The area heading towards the plantar part of her foot required an aggressive debridement to clean this up. This is quite a sizable divot but does not go to bone. The area on the right Achilles heel is also covered in a high 100% nonviable tissue we have been using Santyl and silver alginate 10/20; the area on the left TMA looks much better. Even the area is spreading into the midfoot looks improved. The right Achilles still is covered and nonviable surface 100%. We have been using Santyl and silver alginate and this will continue for this week The patient is approved for 5 Apligraf's and I think she is good enough to start using these next week 10/27; the patient's wounds actually look quite good. We have been using Santyl and silver alginate. We applied Apligraf #1 today Saw Dr. Stanford Breed her vascular surgeon on 10/25 he told her TMA was healing nicely. She will follow-up with them in December to repeat noninvasive testing. 08/16/2021 upon evaluation today patient appears to be doing much better in regard to her wounds. Both are showing signs of improvement which is great news. I am going to perform some debridement to clear away some of the necrotic debris currently this will just be a light debridement. Nonetheless other than that I feel like that the Apligraf is doing quite well working and reapply today and this will be #3 as far as the application is concerned. 12/20; since the patient was last here she was seen by Dr. Stanford Breed who felt that her Doppler flow was sluggish in the bypass. She therefore went underwent a urgent angiogram on 09/07/2021 by Dr. Carlis Abbott. She had an  angioplasty of the distal anastomosis and the anterior tibial artery. She tolerated this well. She had no flow-limiting stenosis in the aortoiliac segment on the left. Some calcification in the left common iliac artery but it was not flow-limiting the common femoral and profunda were patent. The left lower extremity bypass had brisk flow with no significant proximal anastomosis on the common femoral artery. Distally there was a high-grade stenosis where the bypass anastomosis was performed. This was angioplastied as well as the anterior tibial artery. 12/28; the patient comes in early because of a wound over the tip of her right first metatarsal head. She is not really sure how this happened. Says she noticed it when she took her sock  off on Friday before she got in the shower. She has been using Hydrofera Blue to this area. This is the original wounded site we dealt with in the setting of a previous right TMA. The patient has PA I researched this on  link. As it turns out she has had recent arterial studies noninvasive on 09/05/2021. On the right she had dampened monophasic waveforms at the popliteal absent waveforms at the PTA and dorsalis pedis. An ABI could not be obtained previously at 0.47. The comment states that they were unable to detect Doppler waveforms of the posterior tibial and dorsalis pedis arteries. 2023 09/26/2021; her original left TMA wound is almost closed. I reapplied Apligraf to the remaining area. The area on the plantar foot is also closed as far as I can tell HOWEVER she comes in with a second wound on the right first metatarsal head remanent both of these probe to bone. I did a bone scraping of this last week which only showed Corynebacterium unlikely to be a true pathogen. Nevertheless I am going to put her on Augmentin today probably for 2 weeks. X-ray will be done today. She has an appointment with Dr. Gwenlyn Saran at vein and vascular next Tuesday I will send him a note  about my concerns about the right TM 1/10; we inadvertently remove the Apligraf from the left surgical site however everything looks closed here. She still has 2 probing wounds over the remanent of her first metatarsal head both go to bone although there is less erythema and swelling in the area. She does not have an appointment with Dr. Donzetta Matters until next Tuesday. I am going to go ahead and order an MRI of the right foot I had originally planned to have or at least consider HBO if the patient has underlying osteomyelitis in the right foot however she tells me that she lost a significant amount of her visual field during her last run with HBO in the right eye nasal aspect. She saw Dr. Zadie Rhine who is a retinal specialist I will see if I can look at his records. She is still on Augmentin as finishes on Monday. 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 She has been on Augmentin with some improvement in the surrounding erythema although the small wound on the tip and the plantar aspect still probe easily to bone and there does not appear to be any viable tissue. UNFORTUNATELY arrives in clinic today with new areas on the right lateral foot neither 1 of these look particularly healthy. No debridement. The patient has an appoint with Dr. Donzetta Matters this morning my question is angiography of the right lego. I also wonder about an infectious disease appointment and I will continue her Augmentin but refer her for consideration. I did not get a culture that was positive even from a bone scraping however. After ID and vascular I will consider hyperbarics although she tells me that after her last round of hyperbarics which was 2 or 3 years ago where she had a bad wound on the original right transmit amputation site she developed visual loss in the  right eye. She saw her retina doctor Dr. Deloria Lair and her interpretation of this was that the hyperbarics may have contributed to a blockage. I wonder whether this was a central retinal vein or artery occlusion. I am  not sure that Dr. Zadie Rhine documents in epic I will need to see if I can find his note 1/25; this is Brinker returns to clinic having a very eventful week. Firstly she underwent angiography by Dr. Stanford Breed of vein and vascular.. Her preintervention ABI was 0. She underwent a right iliofemoral drug-coated balloon angioplasty, right popliteal drug-coated balloon angioplasty and a right tibial peroneal trunk and peroneal angioplasty by Dr. Stanford Breed. She tolerated this well. She definitely has better blood flow to the right foot. She saw Dr. Doren Custard in follow-up who felt her Doppler flow in the right foot was good. An x-ray did not show any evidence of osteomyelitis however I noted a previous MRI certainly did. She also saw infectious diseases been started on IV daptomycin and ertapenem. She had the PICC line placed. I had started her on Augmentin on February 3 and that had some improvement in the erythema and swelling over the remanent of her first metatarsal head. As far as I am aware she did not have a specific organism cultured at least not by me although I did try. Everything on the left is closed including the left transmit amputation site and the area on her left posterior heel/Achilles. We are using silver collagen on the right foot now which has 2 open wounds on the right first metatarsal head and the right fifth metatarsal head 2/1; the patient's left foot transmit site and the area on her heel remain closed. Some eschar but I do not think this requires debridement She has areas on the right first and right fifth met heads underlying osteomyelitis she is on IV daptomycin and ertapenem. The areas are small but probe to bone. I have debrided with a #3 curette hemostasis with direct pressure I  am able to get to some decent looking surrounding granulation tissue on the more worrisome areas on the right first metatarsal head 2/8; left foot transverse metatarsal amputation site which was the original wound we were dealing with this in the clinic remains closed looks very healthy even that he part that extended towards her plantar foot in the mid aspect of the incision. Her right transmetatarsal amputation site appears less erythematous and less swollen however the wound on the first met head probes to bone. The bone itself does not feel that vibrant. The wounds on the fifth metatarsal head also on the right have eschar on the surface I did not consider debriding this today. 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 Dr. 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 We will therefore pursue hyperbaric oxygen therapy and treatment of the underlying refractory osteomyelitis 2/15; the patient's right foot actually looks a lot better 2 areas over the fifth met head are closed. 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 She is on IV antibiotics and has been approved for hyperbarics. In the meantime the whole right first met head remanent looks a lot better with antibiotics less swelling and less erythema but still with the open area probing to bone 2/22; saw Dr. Luan Pulling and then intravascular yesterday. She apparently had her arterial studies done. I have not reviewed these today however he stated he was well satisfied. She is still on  IV antibiotics and tolerating hyperbaric oxygen well. Her largest wound is a small open area of the probes the bone over her right fifth metatarsal head remnant.  She's been using silver collagen Objective Constitutional Pulse regular and within target range for patient.. Temperature is normal and within the target range for the patient.. Vitals Time Taken: 12:53 PM, Height: 64 in, Weight: 135 lbs, BMI: 23.2, Temperature: 97.8 F, Pulse: 83 bpm, Respiratory Rate: 16 breaths/min, Blood Pressure: 146/66 mmHg. General Notes: would exam; right first metatarsal head remnant. The major albeit small wound on this area probed to bone but I'm having difficulty proving that today. I used a #3 curet to remove eschar and debris from the surface rings didn't look too bad and it looks like it's contracting. HOWEVER the patient has a new injury below this which looks like a hemorrhagic callus. A used to #3 curet to remove this and underneath that S tissue looks viable however we'll have to see how that goes. There is no evidence of infection in this area with marked improvement with IV antibiotics and hyperbaric oxygen the tissue is no longer erythematous and much less swollen Integumentary (Hair, Skin) Wound #15 status is Open. Original cause of wound was Other Lesion. The date acquired was: 09/15/2021. The wound has been in treatment 8 weeks. 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 The wound measures 0.2cm length x 0.5cm width x 0.2cm depth; 0.079cm^2 area and 0.016cm^3 volume. There is Fat Layer (Subcutaneous Tissue) exposed. There is no tunneling or undermining noted. There is a medium amount of serosanguineous drainage noted. The wound margin is distinct with the outline attached to the wound base. There is small (1-33%) pale granulation within the wound bed. There is a large (67-100%) amount of necrotic tissue within the wound bed including Adherent Slough. Wound #19 status is Open. Original cause of wound was Gradually Appeared. The date acquired was: 11/15/2021. The wound is located on the Right,Distal Amputation Site - Transmetatarsal.  The wound measures 0.2cm length x 0.2cm width x 0.2cm depth; 0.031cm^2 area and 0.006cm^3 volume. There is Fat Layer (Subcutaneous Tissue) exposed. There is no tunneling or undermining noted. There is a medium amount of serosanguineous drainage noted. There is small (1-33%) red granulation within the wound bed. There is a large (67-100%) amount of necrotic tissue within the wound bed including Eschar. 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 There was a Selective/Open Wound Non-Viable Tissue Debridement with a total area of 0.1 sq cm performed by Ricard Dillon., MD. With the following instrument(s): Curette to remove Non-Viable tissue/material. Material removed includes Eschar. A time out was conducted at 13:26, prior to the start of the procedure. A Minimum amount of bleeding was controlled with Pressure. The procedure was tolerated well with a pain level of 0 throughout and a pain level of 0 following the procedure. Post Debridement Measurements: 0.2cm length x 0.5cm width x 0.2cm depth; 0.016cm^3 volume. Character of Wound/Ulcer Post Debridement is improved. Severity of Tissue Post Debridement is: Fat layer  exposed. Post procedure Diagnosis Wound #15: Same as Pre-Procedure Wound #19 Pre-procedure diagnosis of Wound #19 is a T be determined located on the Right,Distal Amputation Site - Transmetatarsal . There was a Selective/Open o Wound  Non-Viable Tissue Debridement with a total area of 0.04 sq cm performed by Ricard Dillon., MD. With the following instrument(s): Curette to remove Non-Viable tissue/material. Material removed includes Eschar after achieving pain control using Other (Benzocaine 20%). No specimens were taken. A time out was conducted at 13:26, prior to the start of the procedure. A Minimum amount of bleeding was controlled with Pressure. The procedure was tolerated well with a pain level of 0 throughout and a pain level of 0 following the procedure. Post Debridement Measurements: 0.2cm length x 0.2cm width x 0.2cm depth; 0.006cm^3 volume. Character of Wound/Ulcer Post Debridement is improved. Post procedure Diagnosis Wound #19: Same as Pre-Procedure Plan Follow-up Appointments: Return Appointment in 1 week. - with Dr. Dellia Nims - 1230 prior to HBO Bathing/ Shower/ Hygiene: May shower with protection but do not get wound dressing(s) wet. Edema Control - Lymphedema / SCD / Other: Elevate legs to the level of the heart or above for 30 minutes daily and/or when sitting, a frequency of: - throughout the day Avoid standing for long periods of time. Home Health: No change in wound care orders this week; continue Home Health for wound care. May utilize formulary equivalent dressing for wound treatment orders unless otherwise specified. Dressing changes to be completed by [facility] on Monday / Wednesday / Friday except when patient has scheduled visit at [facility]. Other Home Health Orders/Instructions: - [facility] Hyperbaric Oxygen Therapy: Evaluate for HBO Therapy Indication: - Wagner 3 diabetic ulcer of right foot 2.0 ATA for 90 Minutes without Air Breaks T Number of Treatments: - 30 otal One treatments per day (delivered Monday through Friday unless otherwise specified in Special Instructions below): Finger stick Blood Glucose Pre- and Post- HBOT Treatment. Follow Hyperbaric Oxygen Glycemia  Protocol Afrin (Oxymetazoline HCL) 0.05% nasal spray - 1 spray in both nostrils daily as needed prior to HBO treatment for difficulty clearing ears WOUND #15: - Amputation Site - Transmetatarsal Wound Laterality: Right Cleanser: Soap and Water Every Other Day/30 Days Discharge Instructions: May shower and wash wound with dial antibacterial soap and water prior to dressing change. Cleanser: Wound Cleanser [facility]) Every Other Day/30 Days Discharge Instructions: Cleanse the wound with wound cleanser prior to applying a clean dressing using gauze sponges, not tissue or cotton balls. Prim Dressing: Promogran Prisma Matrix, 4.34 (sq in) (silver collagen) (Home Health) Every Other Day/30 Days ary Discharge Instructions: Moisten collagen with [place] Jelly Secondary Dressing: Woven Gauze Sponges 2x2 in [facility]) Every Other Day/30 Days Discharge Instructions: Apply over primary dressing as directed. Secured With: [employer], 4.5x3.1 (in/yd) [facility]) Every Other Day/30 Days Discharge Instructions: Secure with Kerlix as directed. Secured With: [employer] T ape, 2x10 (in/yd) (Home Health) Every Other Day/30 Days Discharge Instructions: Secure dressing with tape as directed. WOUND #19: - Amputation Site - Transmetatarsal Wound Laterality: Right, Distal Cleanser: Soap and Water Every Other Day/30 Days Discharge Instructions: May shower and wash wound with dial antibacterial soap and water prior to dressing change. Cleanser: Wound Cleanser [facility]) Every Other Day/30 Days Discharge Instructions: Cleanse the wound with wound cleanser prior to applying a clean dressing using gauze sponges, not tissue or cotton balls. Prim Dressing: Promogran Prisma Matrix, 4.34 (sq in) (silver collagen) (Home Health) Every Other Day/30 Days ary  Discharge Instructions: Moisten collagen with [place] Jelly Secondary Dressing: Woven Gauze Sponges 2x2 in [facility]) Every Other Day/30 Days Discharge  Instructions: Apply over primary dressing as directed. Secured With: [employer], 4.5x3.1 (in/yd) [facility]) Every Other Day/30 Days Discharge Instructions: Secure with Kerlix as directed. Secured With: [employer] T ape, 2x10 (in/yd) (Home Health) Every Other Day/30 Days Discharge Instructions: Secure dressing with tape as directed. #1 continue with moistened silver collagen with hydrogel to both wound areas. Original wound seems better to me however the hemorrhagic callus small area underneath this was a bit concerning #2 she continues on IV antibiotics the erythema and edema in this area that was so prevalent has largely resolved. She is on hyperbaric oxygen under the diabetic foot ulcer indication so if her wounds closed that we'll need to stop otherwise like to get 30 treatments at least Electronic Signature(s) Signed: 11/21/2021 6:18:01 PM By: [name] RN, BSN Signed: 12/01/2021 12:53:18 PM By: [name] MD Previous Signature: 11/15/2021 4:06:18 PM Version By: [name] MD Entered By: [name] on 11/21/2021 18:16:01 -------------------------------------------------------------------------------- SuperBill Details Patient Name: Date of Service: [name], [name] 11/15/2021 Medical Record Number: [id number] Patient Account Number: [id number] Date of Birth/Sex: Treating RN: 27-Apr-1941 (80 y.o. F) Primary Care Provider: [name] Other Clinician: Referring Provider: Treating Provider/[name]: Tressie Stalker in Treatment: 21 Diagnosis Coding ICD-10 Codes Code Description E11.621 Type 2 diabetes mellitus with foot ulcer T81.31XD Disruption of external operation (surgical) wound, not elsewhere classified, subsequent encounter L97.514 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 M86.671 Other  chronic osteomyelitis, right ankle and foot Facility Procedures CPT4 Code: 35456256 Description: 38937 - DEBRIDE WOUND 1ST 20 SQ CM OR < ICD-10 Diagnosis Description L97.514 Non-pressure chronic ulcer of other part of right foot with necrosis of bone Modifier: Quantity: 1 Physician Procedures : CPT4 Code Description Modifier [id number] - WC PHYS DEBR WO ANESTH 20 SQ CM 1 ICD-10 Diagnosis Description L97.514 Non-pressure chronic ulcer of other part of right foot with necrosis of bone Quantity: Electronic Signature(s) Signed: 11/15/2021 4:06:18 PM By: [name] MD Entered By: [name] on 11/15/2021 13:53:41

## 2021-11-16 ENCOUNTER — Ambulatory Visit (HOSPITAL_COMMUNITY)
Admission: RE | Admit: 2021-11-16 | Payer: Medicare Other | Source: Ambulatory Visit | Attending: Cardiovascular Disease | Admitting: Cardiovascular Disease

## 2021-11-16 ENCOUNTER — Encounter (HOSPITAL_BASED_OUTPATIENT_CLINIC_OR_DEPARTMENT_OTHER): Payer: Medicare Other | Admitting: Internal Medicine

## 2021-11-16 DIAGNOSIS — E11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: Secondary | ICD-10-CM | POA: Diagnosis not present

## 2021-11-16 DIAGNOSIS — L97428 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: Secondary | ICD-10-CM | POA: Diagnosis not present

## 2021-11-16 DIAGNOSIS — L97512 Non-pressure chronic ulcer of other part of right foot with fat layer exposed: Secondary | ICD-10-CM | POA: Diagnosis not present

## 2021-11-16 DIAGNOSIS — L97528 Non-pressure chronic ulcer of other part of left foot with other specified severity: Secondary | ICD-10-CM | POA: Diagnosis not present

## 2021-11-16 DIAGNOSIS — E11621 Type 2 diabetes mellitus with foot ulcer: Secondary | ICD-10-CM | POA: Diagnosis not present

## 2021-11-16 DIAGNOSIS — L97514 Non-pressure chronic ulcer of other part of right foot with necrosis of bone: Secondary | ICD-10-CM | POA: Diagnosis not present

## 2021-11-16 DIAGNOSIS — M86671 Other chronic osteomyelitis, right ankle and foot: Secondary | ICD-10-CM | POA: Diagnosis not present

## 2021-11-16 LAB — GLUCOSE, CAPILLARY
Glucose-Capillary: 121 mg/dL — ABNORMAL HIGH (ref 70–99)
Glucose-Capillary: 158 mg/dL — ABNORMAL HIGH (ref 70–99)
Glucose-Capillary: 194 mg/dL — ABNORMAL HIGH (ref 70–99)

## 2021-11-17 ENCOUNTER — Encounter (HOSPITAL_BASED_OUTPATIENT_CLINIC_OR_DEPARTMENT_OTHER): Payer: Medicare Other | Admitting: General Surgery

## 2021-11-17 ENCOUNTER — Other Ambulatory Visit: Payer: Self-pay

## 2021-11-17 DIAGNOSIS — E11621 Type 2 diabetes mellitus with foot ulcer: Secondary | ICD-10-CM | POA: Diagnosis not present

## 2021-11-17 DIAGNOSIS — E11628 Type 2 diabetes mellitus with other skin complications: Secondary | ICD-10-CM | POA: Diagnosis not present

## 2021-11-17 DIAGNOSIS — L97514 Non-pressure chronic ulcer of other part of right foot with necrosis of bone: Secondary | ICD-10-CM | POA: Diagnosis not present

## 2021-11-17 DIAGNOSIS — Z89412 Acquired absence of left great toe: Secondary | ICD-10-CM | POA: Diagnosis not present

## 2021-11-17 DIAGNOSIS — L97512 Non-pressure chronic ulcer of other part of right foot with fat layer exposed: Secondary | ICD-10-CM | POA: Diagnosis not present

## 2021-11-17 DIAGNOSIS — I70202 Unspecified atherosclerosis of native arteries of extremities, left leg: Secondary | ICD-10-CM | POA: Diagnosis not present

## 2021-11-17 DIAGNOSIS — Z4781 Encounter for orthopedic aftercare following surgical amputation: Secondary | ICD-10-CM | POA: Diagnosis not present

## 2021-11-17 DIAGNOSIS — M869 Osteomyelitis, unspecified: Secondary | ICD-10-CM | POA: Diagnosis not present

## 2021-11-17 DIAGNOSIS — D638 Anemia in other chronic diseases classified elsewhere: Secondary | ICD-10-CM | POA: Diagnosis not present

## 2021-11-17 DIAGNOSIS — L97528 Non-pressure chronic ulcer of other part of left foot with other specified severity: Secondary | ICD-10-CM | POA: Diagnosis not present

## 2021-11-17 DIAGNOSIS — E11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: Secondary | ICD-10-CM | POA: Diagnosis not present

## 2021-11-17 DIAGNOSIS — L97428 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: Secondary | ICD-10-CM | POA: Diagnosis not present

## 2021-11-17 DIAGNOSIS — I10 Essential (primary) hypertension: Secondary | ICD-10-CM | POA: Diagnosis not present

## 2021-11-17 DIAGNOSIS — M86671 Other chronic osteomyelitis, right ankle and foot: Secondary | ICD-10-CM | POA: Diagnosis not present

## 2021-11-17 LAB — GLUCOSE, CAPILLARY
Glucose-Capillary: 171 mg/dL — ABNORMAL HIGH (ref 70–99)
Glucose-Capillary: 147 mg/dL — ABNORMAL HIGH (ref 70–99)

## 2021-11-17 NOTE — Progress Notes (Signed)
[name], [name] ([id number]) Visit Report for 11/15/2021 Arrival Information Details Patient Name: Date of Service: [name] 11/15/2021 1:30 PM Medical Record Number: [id number] Patient Account Number: [id number] Date of Birth/Sex: Treating RN: 01-15-1941 (81 y.o. [name] Primary Care Lindalou Soltis: [name] Other Clinician: [name] Referring Adison Jerger: Treating Kylan Veach/[name]: Waynette Buttery in Treatment: 21 Visit Information History Since Last Visit All ordered tests and consults were completed: Yes Patient Arrived: Ambulatory Added or deleted any medications: No Arrival Time: 12:45 Any new allergies or adverse reactions: No Accompanied By: self Had a fall or experienced change in No Transfer Assistance: None activities of daily living that may affect Patient Requires Transmission-Based Precautions: No risk of falls: Patient Has Alerts: Yes Signs or symptoms of abuse/neglect since last visito No Patient Alerts: Patient on Blood Thinner Hospitalized since last visit: No L ABI: 0.82 Implantable device outside of the clinic excluding No R ABI: 0.47 cellular tissue based products placed in the center since last visit: Pain Present Now: No Electronic Signature(s) Signed: 11/17/2021 10:22:35 AM By: [name] CHT EMT BS , , Entered By: [name] on 11/15/2021 14:17:08 -------------------------------------------------------------------------------- Encounter Discharge Information Details Patient Name: Date of Service: [name], [name]. 11/15/2021 1:30 PM Medical Record Number: [id number] Patient Account Number: [id number] Date of Birth/Sex: Treating RN: December 16, 1940 (81 y.o. [name] Primary Care Haytham Maher: [name] Other Clinician: [name] Referring Ajooni Karam: Treating Cristian Grieves/[name]: Waynette Buttery in Treatment: 21 Encounter Discharge  Information Items Discharge Condition: Stable Ambulatory Status: Ambulatory Discharge Destination: Home Transportation: Private Auto Accompanied By: self Schedule Follow-up Appointment: No Clinical Summary of Care: Electronic Signature(s) Signed: 11/17/2021 10:22:35 AM By: [name] CHT EMT BS , , Entered By: [name] on 11/15/2021 16:26:51 -------------------------------------------------------------------------------- Vitals Details Patient Name: Date of Service: [name], [place]. 11/15/2021 1:30 PM Medical Record Number: [id number] Patient Account Number: [id number] Date of Birth/Sex: Treating RN: [birth year] (81 y.o. [name] Primary Care [name]: [name] Other Clinician: [name] Referring Veronia Laprise: Treating Bernadett Milian/[name]: Waynette Buttery in Treatment: 21 Vital Signs Time Taken: 12:53 Temperature (F): 97.8 Height (in): 64 Pulse (bpm): 83 Weight (lbs): 135 Respiratory Rate (breaths/min): 16 Body Mass Index (BMI): 23.2 Blood Pressure (mmHg): 146/66 Capillary Blood Glucose (mg/dl): 165 Reference Range: 80 - 120 mg / dl Notes Glucose taken at 1324 Electronic Signature(s) Signed: 11/17/2021 10:22:35 AM By: [name] CHT EMT BS , , Entered By: [name] on 11/15/2021 14:18:19

## 2021-11-17 NOTE — Progress Notes (Signed)
MIYANI, CRONIC ([id number]) Visit Report for 11/16/2021 Arrival Information Details Patient Name: Date of Service: [name] 11/16/2021 1:00 PM Medical Record Number: [id number] Patient Account Number: [id number] Date of Birth/Sex: Treating RN: [birth year] (81 y.o. [name], [name] Primary Care Nitza Schmid: [name] Other Clinician: [name] Referring Anderia Lorenzo: Treating Mirca Yale/[name]: Tressie Stalker in Treatment: 96 Visit Information History Since Last Visit All ordered tests and consults were completed: Yes Patient Arrived: Ambulatory Added or deleted any medications: No Arrival Time: 12:58 Any new allergies or adverse reactions: No Accompanied By: self Had a fall or experienced change in No Transfer Assistance: None activities of daily living that may affect Patient Identification Verified: Yes risk of falls: Secondary Verification Process Completed: Yes Signs or symptoms of abuse/neglect since last visito No Patient Requires Transmission-Based Precautions: No Hospitalized since last visit: No Patient Has Alerts: Yes Implantable device outside of the clinic excluding No Patient Alerts: Patient on Blood Thinner cellular tissue based products placed in the center L ABI: 0.82 since last visit: R ABI: 0.47 Pain Present Now: No Electronic Signature(s) Signed: 11/17/2021 10:22:35 AM By: [name] CHT EMT BS , , Entered By: [name] on 11/16/2021 13:13:26 -------------------------------------------------------------------------------- Encounter Discharge Information Details Patient Name: Date of Service: [name], [name] 11/16/2021 1:00 PM Medical Record Number: [id number] Patient Account Number: [id number] Date of Birth/Sex: Treating RN: 03/18/1941 (81 y.o. [name] Primary Care Jonai Weyland: [name] Other Clinician: [name] Referring Estefanie Cornforth: Treating Jannae Fagerstrom/[name]:  Tressie Stalker in Treatment: 17 Encounter Discharge Information Items Discharge Condition: Stable Ambulatory Status: Ambulatory Discharge Destination: Home Transportation: Private Auto Accompanied By: self Schedule Follow-up Appointment: No Clinical Summary of Care: Electronic Signature(s) Signed: 11/17/2021 10:22:35 AM By: [name] CHT EMT BS , , Entered By: [name] on 11/16/2021 15:36:12 -------------------------------------------------------------------------------- Vitals Details Patient Name: Date of Service: [name], [name] 11/16/2021 1:00 PM Medical Record Number: [id number] Patient Account Number: [id number] Date of Birth/Sex: Treating RN: 1941-07-14 (81 y.o. [name] Primary Care [name]: [name] Other Clinician: [name] Referring Aristidis Talerico: Treating Ettamae Barkett/[name]: Tressie Stalker in Treatment: 22 Vital Signs Time Taken: 12:58 Temperature (F): 97.8 Height (in): 64 Pulse (bpm): 69 Weight (lbs): 135 Respiratory Rate (breaths/min): 14 Body Mass Index (BMI): 23.2 Blood Pressure (mmHg): 124/48 Capillary Blood Glucose (mg/dl): 158 Reference Range: 80 - 120 mg / dl Electronic Signature(s) Signed: 11/17/2021 10:22:35 AM By: [name] CHT EMT BS , , Entered By: [name] on 11/16/2021 13:13:59

## 2021-11-17 NOTE — Progress Notes (Signed)
[name], PINA ([id number]) Visit Report for 11/15/2021 HBO Details Patient Name: Date of Service: [name] 11/15/2021 1:30 PM Medical Record Number: [id number] Patient Account Number: [id number] Date of Birth/Sex: Treating RN: 1941/01/08 (81 y.o. 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 HBO Treatment Details Treatment Number: 6 Patient Type: Outpatient Chamber Type: Monoplace Chamber Serial #: [id number] Treatment Protocol: 2.0 ATA with 90 minutes oxygen, and no air breaks Treatment Details Compression Rate Down: 1.5 psi / minute De-Compression Rate Up: 2.0 psi / minute Air breaks and breathing Decompress Decompress Compress Tx Pressure Begins Reached periods Begins Ends (leave unused spaces blank) Chamber Pressure (ATA 1 2 ------2 1 ) Clock Time (24 hr) 14:08 14:18 - - - - - - 15:48 15:56 Treatment Length: 108 (minutes) Treatment Segments: 4 Vital Signs Capillary Blood Glucose Reference Range: 80 - 120 mg / dl HBO Diabetic Blood Glucose Intervention Range: <131 mg/dl or >249 mg/dl Type: Time Vitals Blood Pulse: Respiratory Temperature: Capillary Blood Glucose Pulse Action Taken: Pressure: Rate: Glucose (mg/dl): Meter #: Oximetry (%) Taken: Pre 12:53 146/66 83 16 97.8 165 Post 15:59 175/62 72 18 98.1 121 discharge per protocol >101 mg/dL Treatment Response Treatment Toleration: Well Treatment Completion Status: Treatment Completed without Adverse  Event Additional Procedure Documentation Tissue Sevierity: Fat layer exposed Electronic Signature(s) Signed: 11/15/2021 4:57:32 PM By: Worthy Keeler PA-C Signed: 11/17/2021 10:22:35 AM By: [name] CHT EMT BS , , Entered By: [name] on 11/15/2021 16:15:57 -------------------------------------------------------------------------------- HBO Safety Checklist Details Patient Name: Date of Service: [name], [name]. 11/15/2021 1:30 PM Medical Record Number: [id number] Patient Account Number: [id number] Date of Birth/Sex: Treating RN: 21-Feb-1941 (81 y.o. [name], [name] Primary Care Kylie Gros: [name] Other Clinician: [name] Referring Eldrick Penick: Treating Saveah Bahar/[name]: Waynette Buttery in Treatment: 21 HBO Safety Checklist Items Safety Checklist Consent Form Signed Patient voided / foley secured and emptied When did you last eato 1230 Last dose of injectable or oral agent 0800 Ostomy pouch emptied and vented if applicable NA All implantable devices assessed, documented and approved NA Intravenous access site secured and place Right Forearm PICC Valuables secured Linens and cotton and cotton/polyester blend (less than 51% polyester) Personal oil-based products / skin lotions / body lotions removed Wigs or hairpieces removed Patient removed item. Smoking or tobacco materials removed NA Books / newspapers / magazines / loose paper removed Cologne, aftershave, perfume and deodorant removed Jewelry removed (may wrap wedding band) Make-up removed Hair care products removed Battery operated devices (external) removed Heating patches and chemical warmers removed Titanium eyewear removed Nail polish cured greater than 10 hours Casting material cured greater than 10 hours NA Hearing aids removed NA Loose dentures or partials removed NA Prosthetics have been removed NA Patient demonstrates correct use of air  break device (if applicable) Patient concerns have been addressed Patient grounding bracelet on and cord attached to chamber Specifics for Inpatients (complete in addition to above) Medication sheet sent with patient Intravenous medications needed or due during therapy sent with patient Drainage tubes (e.g. nasogastric tube or chest tube secured and  vented) Endotracheal or Tracheotomy tube secured Cuff deflated of air and inflated with saline Airway suctioned Electronic Signature(s) Signed: 11/17/2021 10:22:35 AM By: [name] CHT EMT BS , , Entered By: [name] on 11/16/2021 13:17:53

## 2021-11-17 NOTE — Progress Notes (Signed)
ZIYON, CEDOTAL ([id number]) Visit Report for 11/16/2021 SuperBill Details Patient Name: Date of Service: [name] 11/16/2021 Medical Record Number: [id number] Patient Account Number: [id number] Date of Birth/Sex: Treating RN: 1941-01-06 (81 y.o. 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, right ankle and foot Facility Procedures CPT4 Code Description Modifier Quantity [id number] G0277-(Facility Use Only) HBOT full body chamber, 28min , 4 ICD-10 Diagnosis Description E11.621 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, right ankle and foot T81.31XD Disruption of external operation (surgical) wound, not elsewhere classified, subsequent encounter L97.514 Non-pressure chronic ulcer of other part of right foot with necrosis of bone Physician Procedures Quantity CPT4 Code Description Modifier [id number] - WC PHYS HYPERBARIC OXYGEN THERAPY 1 ICD-10 Diagnosis Description E11.621 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

## 2021-11-17 NOTE — Progress Notes (Signed)
LEOTHA, WESTERMEYER ([id number]) Visit Report for 11/17/2021 SuperBill Details Patient Name: Date of Service: [name] 11/17/2021 Medical Record Number: [id number] Patient Account Number: [id number] Date of Birth/Sex: Treating RN: 1941-06-20 (81 y.o. 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, right ankle and foot Facility Procedures CPT4 Code Description Modifier Quantity [id number] G0277-(Facility Use Only) HBOT full body chamber, 110min , 4 ICD-10 Diagnosis Description 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 Physician Procedures Quantity CPT4 Code Description Modifier [id number] - WC PHYS HYPERBARIC OXYGEN THERAPY 1 ICD-10 Diagnosis Description 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

## 2021-11-17 NOTE — Progress Notes (Signed)
[name] ([id number]) Visit Report for 11/16/2021 HBO Details Patient Name: Date of Service: [name] 11/16/2021 1:00 PM Medical Record Number: [id number] Patient Account Number: [id number] Date of Birth/Sex: Treating RN: [birth year] (81 y.o. 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 HBO Treatment Details Treatment Number: 7 Patient Type: Outpatient Chamber Type: Monoplace Chamber Serial #: [id number] Treatment Protocol: 2.0 ATA with 90 minutes oxygen, and no air breaks Treatment Details Compression Rate Down: 1.5 psi / minute De-Compression Rate Up: 2.0 psi / minute Air breaks and breathing Decompress Decompress Compress Tx Pressure Begins Reached periods Begins Ends (leave unused spaces blank) Chamber Pressure (ATA 1 2 ------2 1 ) Clock Time (24 hr) 13:07 13:15 - - - - - - 14:45 14:53 Treatment Length: 106 (minutes) Treatment Segments: 4 Vital Signs Capillary Blood Glucose Reference Range: 80 - 120 mg / dl HBO Diabetic Blood Glucose Intervention Range: <131 mg/dl or >249 mg/dl Time Vitals Blood Respiratory Capillary Blood Glucose Pulse Action Type: Pulse: Temperature: Taken: Pressure: Rate: Glucose (mg/dl): Meter #: Oximetry (%) Taken: Pre 12:58 124/48 69 14 97.8 158 2 100 Post 14:57 118/62 68 12 98.1 194 2 99 Treatment Response Treatment Toleration: Well Treatment Completion Status: Treatment Completed without Adverse Event Additional Procedure  Documentation Tissue Sevierity: Fat layer exposed Reyan Helle Notes no concerns with treatment given Physician HBO Attestation: I certify that I supervised this HBO treatment in accordance with Medicare guidelines. A trained emergency response team is readily available per Yes hospital policies and procedures. Continue HBOT as ordered. Yes Electronic Signature(s) Signed: 11/16/2021 5:03:55 PM By: [name] MD Signed: 11/17/2021 10:22:35 AM By: [name] CHT EMT BS , , Entered By: [name] on 11/16/2021 15:31:43 -------------------------------------------------------------------------------- HBO Safety Checklist Details Patient Name: Date of Service: [place]. 11/16/2021 1:00 PM Medical Record Number: [id number] Patient Account Number: [id number] Date of Birth/Sex: Treating RN: 06-26-1941 (81 y.o. [name], [name] Primary Care Shakeyla Giebler: [name] Other Clinician: [name] Referring Alivya Wegman: Treating Shahara Hartsfield/[name]: Tressie Stalker in Treatment: 75 HBO Safety Checklist Items Safety Checklist Consent Form Signed Patient voided / foley secured and emptied When did you last eato 1220 Last dose of injectable or oral agent 0900 Ostomy pouch emptied and vented if applicable NA All implantable devices assessed, documented and approved NA Intravenous access site secured and place Right Forearm PICC Valuables secured Linens and cotton and cotton/polyester blend (less than 51% polyester) Personal oil-based products / skin lotions / body lotions removed Wigs or hairpieces removed Patient removed item. Smoking or tobacco materials removed NA Books / newspapers / magazines / loose paper removed NA Cologne, aftershave, perfume and deodorant removed Jewelry removed (may wrap wedding band) Make-up removed Hair care products removed Battery operated devices (external) removed Heating patches and chemical  warmers removed Titanium eyewear removed Nail polish cured greater than 10 hours Casting material cured greater than 10 hours NA Hearing aids removed NA Loose dentures or partials removed NA Prosthetics have been removed NA Patient demonstrates correct use of air break device (if applicable) Patient concerns have been addressed Patient grounding bracelet  on and cord attached to chamber Specifics for Inpatients (complete in addition to above) Medication sheet sent with patient NA Intravenous medications needed or due during therapy sent with patient NA Drainage tubes (e.g. nasogastric tube or chest tube secured and vented) NA Endotracheal or Tracheotomy tube secured NA Cuff deflated of air and inflated with saline NA Airway suctioned NA Notes Paper version used prior to treatment. Electronic Signature(s) Signed: 11/17/2021 10:22:35 AM By: [name] CHT EMT BS , , Entered By: [name] on 11/16/2021 13:16:59

## 2021-11-20 ENCOUNTER — Other Ambulatory Visit: Payer: Self-pay

## 2021-11-20 ENCOUNTER — Encounter (HOSPITAL_BASED_OUTPATIENT_CLINIC_OR_DEPARTMENT_OTHER): Payer: Medicare Other | Admitting: Internal Medicine

## 2021-11-20 DIAGNOSIS — L97528 Non-pressure chronic ulcer of other part of left foot with other specified severity: Secondary | ICD-10-CM | POA: Diagnosis not present

## 2021-11-20 DIAGNOSIS — E11621 Type 2 diabetes mellitus with foot ulcer: Secondary | ICD-10-CM

## 2021-11-20 DIAGNOSIS — L97428 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: Secondary | ICD-10-CM | POA: Diagnosis not present

## 2021-11-20 DIAGNOSIS — Z4781 Encounter for orthopedic aftercare following surgical amputation: Secondary | ICD-10-CM | POA: Diagnosis not present

## 2021-11-20 DIAGNOSIS — D638 Anemia in other chronic diseases classified elsewhere: Secondary | ICD-10-CM | POA: Diagnosis not present

## 2021-11-20 DIAGNOSIS — I70202 Unspecified atherosclerosis of native arteries of extremities, left leg: Secondary | ICD-10-CM | POA: Diagnosis not present

## 2021-11-20 DIAGNOSIS — L97514 Non-pressure chronic ulcer of other part of right foot with necrosis of bone: Secondary | ICD-10-CM | POA: Diagnosis not present

## 2021-11-20 DIAGNOSIS — M86671 Other chronic osteomyelitis, right ankle and foot: Secondary | ICD-10-CM | POA: Diagnosis not present

## 2021-11-20 DIAGNOSIS — E11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: Secondary | ICD-10-CM | POA: Diagnosis not present

## 2021-11-20 DIAGNOSIS — I10 Essential (primary) hypertension: Secondary | ICD-10-CM | POA: Diagnosis not present

## 2021-11-20 DIAGNOSIS — Z89412 Acquired absence of left great toe: Secondary | ICD-10-CM | POA: Diagnosis not present

## 2021-11-20 DIAGNOSIS — E11628 Type 2 diabetes mellitus with other skin complications: Secondary | ICD-10-CM | POA: Diagnosis not present

## 2021-11-20 LAB — GLUCOSE, CAPILLARY
Glucose-Capillary: 158 mg/dL — ABNORMAL HIGH (ref 70–99)
Glucose-Capillary: 136 mg/dL — ABNORMAL HIGH (ref 70–99)

## 2021-11-20 NOTE — Progress Notes (Addendum)
EFRATA, BRUNNER ([id number]) Visit Report for 11/20/2021 HBO Details Patient Name: Date of Service: [name] 11/20/2021 1:00 PM Medical Record Number: [id number] Patient Account Number: [id number] Date of Birth/Sex: Treating RN: 06/27/1941 (81 y.o. 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 HBO Treatment Details Treatment Number: 9 Patient Type: Outpatient Chamber Type: Monoplace Chamber Serial #: [id number] Treatment Protocol: 2.0 ATA with 90 minutes oxygen, and no air breaks Treatment Details Compression Rate Down: 2.0 psi / minute De-Compression Rate Up: 2.0 psi / minute Air breaks and breathing Decompress Decompress Compress Tx Pressure Begins Reached periods Begins Ends (leave unused spaces blank) Chamber Pressure (ATA 1 2 ------2 1 ) Clock Time (24 hr) 13:17 13:28 - - - - - - 14:58 15:07 Treatment Length: 110 (minutes) Treatment Segments: 4 Vital Signs Capillary Blood Glucose Reference Range: 80 - 120 mg / dl HBO Diabetic Blood Glucose Intervention Range: <131 mg/dl or >249 mg/dl Time Vitals Blood Respiratory Capillary Blood Glucose Pulse Action Type: Pulse: Temperature: Taken: Pressure: Rate: Glucose (mg/dl): Meter #: Oximetry (%) Taken: Pre 13:05 148/58 72 14 97.5 158 2 100 Post 15:10 138/62 70 14 97.9 136 2 100 Treatment Response Treatment Toleration: Well Treatment Completion Status: Treatment Completed without Adverse Event Additional Procedure  Documentation 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 A trained emergency response team is readily available per Yes hospital policies and procedures. Continue HBOT as ordered. Yes Electronic Signature(s) Signed: 11/21/2021 9:07:20 AM By: [name] DO Signed: 11/22/2021 11:45:39 AM By: [name] CHT EMT BS , , Signed: 11/22/2021 11:45:39 AM By: [name] CHT EMT BS , , Entered By: [name] on 11/20/2021 15:42:16 -------------------------------------------------------------------------------- HBO Safety Checklist Details Patient Name: Date of Service: [place]. 11/20/2021 1:00 PM Medical Record Number: [id number] Patient Account Number: [id number] Date of Birth/Sex: Treating RN: 07-21-1941 (81 y.o. [name] Primary Care [name]: [name] Other Clinician: [name] Referring Wenonah Milo: Treating Jamile Sivils/[name]: Jimmy Footman in Treatment: 22 HBO Safety Checklist Items Safety Checklist Consent Form Signed Patient voided / foley secured and emptied When did you last eato 1215 Last dose of injectable or oral agent 0815 Ostomy pouch emptied and vented if applicable NA All implantable devices assessed, documented and approved NA Intravenous access site secured and place Right Forearm PICC Valuables secured Linens and cotton and cotton/polyester blend (less than 51% polyester) Personal oil-based products / skin lotions / body lotions removed Wigs or hairpieces removed Patient removed item. Smoking or tobacco materials removed NA Books / newspapers / magazines / loose paper removed Cologne, aftershave, perfume and deodorant removed Jewelry removed (may wrap wedding band) Make-up removed Hair care products removed Battery operated devices (external) removed Heating patches and  chemical warmers removed Titanium eyewear removed NA Nail polish cured greater than 10 hours Casting material cured greater than 10 hours NA Hearing aids removed NA Loose dentures or partials removed NA Prosthetics have been removed NA Patient demonstrates correct use of air break device (if applicable) Patient concerns have  been addressed Patient grounding bracelet on and cord attached to chamber Specifics for Inpatients (complete in addition to above) Medication sheet sent with patient NA Intravenous medications needed or due during therapy sent with patient NA Drainage tubes (e.g. nasogastric tube or chest tube secured and vented) NA Endotracheal or Tracheotomy tube secured NA Cuff deflated of air and inflated with saline NA Airway suctioned NA Notes Paper version used prior to treatment. Electronic Signature(s) Signed: 11/20/2021 2:10:41 PM By: [name] CHT EMT BS , , Entered By: [name] on 11/20/2021 13:57:41

## 2021-11-20 NOTE — Progress Notes (Signed)
[name], [name] ([id number]) Visit Report for 11/17/2021 HBO Details Patient Name: Date of Service: [name] 11/17/2021 1:00 PM Medical Record Number: [id number] Patient Account Number: [id number] Date of Birth/Sex: Treating RN: August 28, 1941 (81 y.o. 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 HBO Treatment Details Treatment Number: 8 Patient Type: Outpatient Chamber Type: Monoplace Chamber Serial #: [id number] Treatment Protocol: 2.0 ATA with 90 minutes oxygen, and no air breaks Treatment Details Compression Rate Down: 2.0 psi / minute De-Compression Rate Up: 2.0 psi / minute Air breaks and breathing Decompress Decompress Compress Tx Pressure Begins Reached periods Begins Ends (leave unused spaces blank) Chamber Pressure (ATA 1 2 ------2 1 ) Clock Time (24 hr) 12:41 12:50 - - - - - - 14:20 14:28 Treatment Length: 107 (minutes) Treatment Segments: 4 Vital Signs Capillary Blood Glucose Reference Range: 80 - 120 mg / dl HBO Diabetic Blood Glucose Intervention Range: <131 mg/dl or >249 mg/dl Time Vitals Blood Respiratory Capillary Blood Glucose Pulse Action Type: Pulse: Temperature: Taken: Pressure: Rate: Glucose (mg/dl): Meter #: Oximetry (%) Taken: Pre 12:19 138/60 66 16 97.7 171 2 100 Post 14:30 148/60 74 16 98 147 2 100 Treatment Response Treatment Toleration: Well Treatment Completion Status: Treatment Completed without Adverse Event Additional Procedure  Documentation 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 A trained emergency response team is readily available per Yes hospital policies and procedures. Continue HBOT as ordered. Yes Electronic Signature(s) Signed: 11/17/2021 4:43:02 PM By: [name] MD FACS Previous Signature: 11/17/2021 3:07:58 PM Version By: [name] CHT EMT BS , , Entered By: [name] on 11/17/2021 16:43:02 -------------------------------------------------------------------------------- HBO Safety Checklist Details Patient Name: Date of Service: [name], [name]. 11/17/2021 1:00 PM Medical Record Number: [id number] Patient Account Number: [id number] Date of Birth/Sex: Treating RN: Sep 25, 1940 (81 y.o. [name] Primary Care Hesham Womac: [name] Other Clinician: [name] Referring Alexas Basulto: Treating Marquette Blodgett/[name]: Mardi Mainland in Treatment: 1 HBO Safety Checklist Items Safety Checklist Consent Form Signed Patient voided / foley secured and emptied When did you last eato 1150 Last dose of injectable or oral agent 0830 Ostomy pouch emptied and vented if applicable NA All implantable devices assessed, documented and approved NA Intravenous access site secured and place Right Forearm PICC Valuables secured Linens and cotton and cotton/polyester blend (less than 51% polyester) Personal oil-based products / skin lotions / body lotions removed Wigs or hairpieces removed Patient removed item. Smoking or tobacco materials removed NA Books / newspapers / magazines / loose paper removed Cologne, aftershave, perfume and deodorant removed Jewelry removed (may wrap wedding band) Make-up removed Hair care products removed Battery operated devices (external) removed Heating patches and chemical warmers removed Titanium eyewear  removed Nail polish cured greater than 10 hours Casting material cured greater than 10 hours NA Hearing aids removed NA Loose dentures or partials removed NA Prosthetics have been removed NA Patient demonstrates correct use of air break device (if applicable) Patient concerns have been addressed Patient grounding bracelet on and cord attached to  chamber Specifics for Inpatients (complete in addition to above) Medication sheet sent with patient NA Intravenous medications needed or due during therapy sent with patient NA Drainage tubes (e.g. nasogastric tube or chest tube secured and vented) NA Endotracheal or Tracheotomy tube secured NA Cuff deflated of air and inflated with saline NA Airway suctioned NA Notes Paper version used prior to treatment. Electronic Signature(s) Signed: 11/20/2021 2:10:41 PM By: [name] CHT EMT BS , , Entered By: [name] on 11/17/2021 12:59:10

## 2021-11-20 NOTE — Progress Notes (Signed)
[name], [name] ([id number]) Visit Report for 11/17/2021 Arrival Information Details Patient Name: Date of Service: [name] 11/17/2021 1:00 PM Medical Record Number: [id number] Patient Account Number: [id number] Date of Birth/Sex: Treating RN: [birth year] (81 y.o. [name], [name] Primary Care Rossy Virag: [name] Other Clinician: [name] Referring Saleah Rishel: Treating Avery Eustice/[name]: Mardi Mainland in Treatment: 71 Visit Information History Since Last Visit All ordered tests and consults were completed: Yes Patient Arrived: Ambulatory Added or deleted any medications: No Arrival Time: 12:15 Any new allergies or adverse reactions: No Accompanied By: self Had a fall or experienced change in No Transfer Assistance: None activities of daily living that may affect Patient Identification Verified: Yes risk of falls: Secondary Verification Process Completed: Yes Signs or symptoms of abuse/neglect since last visito No Patient Requires Transmission-Based Precautions: No Hospitalized since last visit: No Patient Has Alerts: Yes Implantable device outside of the clinic excluding No Patient Alerts: Patient on Blood Thinner cellular tissue based products placed in the center L ABI: 0.82 since last visit: R ABI: 0.47 Pain Present Now: No Electronic Signature(s) Signed: 11/20/2021 2:10:41 PM By: [name] CHT EMT BS , , Entered By: [name] on 11/17/2021 12:57:19 -------------------------------------------------------------------------------- Encounter Discharge Information Details Patient Name: Date of Service: [name], [name] 11/17/2021 1:00 PM Medical Record Number: [id number] Patient Account Number: [id number] Date of Birth/Sex: Treating RN: 10-15-1940 (81 y.o. [name] Primary Care Senay Sistrunk: [name] Other Clinician: [name] Referring Murrell Dome: Treating Vanshika Jastrzebski/[name]: Mardi Mainland in Treatment: 32 Encounter Discharge Information Items Discharge Condition: Stable Ambulatory Status: Ambulatory Discharge Destination: Home Transportation: Private Auto Accompanied By: self Schedule Follow-up Appointment: No Clinical Summary of Care: Electronic Signature(s) Signed: 11/17/2021 3:09:00 PM By: [name] CHT EMT BS , , Entered By: [name] on 11/17/2021 15:09:00 -------------------------------------------------------------------------------- Vitals Details Patient Name: Date of Service: [name] Park. 11/17/2021 1:00 PM Medical Record Number: [id number] Patient Account Number: [id number] Date of Birth/Sex: Treating RN: 28-Mar-1941 (81 y.o. [name] Primary Care Burnie Therien: [name] Other Clinician: [name] Referring Patty Leitzke: Treating Vanice Rappa/[name]: Mardi Mainland in Treatment: 22 Vital Signs Time Taken: 12:19 Temperature (F): 97.7 Height (in): 64 Pulse (bpm): 66 Weight (lbs): 135 Respiratory Rate (breaths/min): 16 Body Mass Index (BMI): 23.2 Blood Pressure (mmHg): 138/60 Capillary Blood Glucose (mg/dl): 171 Reference Range: 80 - 120 mg / dl Airway Pulse Oximetry (%): 100 Electronic Signature(s) Signed: 11/20/2021 2:10:41 PM By: [name] CHT EMT BS , , Entered By: [name] on 11/17/2021 12:57:56

## 2021-11-20 NOTE — Progress Notes (Addendum)
[name], [name] ([id number]) Visit Report for 11/20/2021 Arrival Information Details Patient Name: Date of Service: [name] 11/20/2021 1:00 PM Medical Record Number: [id number] Patient Account Number: [id number] Date of Birth/Sex: Treating RN: [birth year] (81 y.o. [name], [name] Primary Care Tzion Wedel: [name] Other Clinician: [name] Referring Syrah Daughtrey: Treating Yaqueline Gutter/[name]: Jimmy Footman in Treatment: 28 Visit Information History Since Last Visit All ordered tests and consults were completed: Yes Patient Arrived: Ambulatory Added or deleted any medications: No Arrival Time: 12:43 Any new allergies or adverse reactions: No Accompanied By: self Had a fall or experienced change in No Transfer Assistance: None activities of daily living that may affect Patient Identification Verified: Yes risk of falls: Secondary Verification Process Completed: Yes Signs or symptoms of abuse/neglect since last visito No Patient Requires Transmission-Based Precautions: No Hospitalized since last visit: No Patient Has Alerts: Yes Implantable device outside of the clinic excluding No Patient Alerts: Patient on Blood Thinner cellular tissue based products placed in the center L ABI: 0.82 since last visit: R ABI: 0.47 Pain Present Now: No Electronic Signature(s) Signed: 11/20/2021 2:10:41 PM By: [name] CHT EMT BS , , Entered By: [name] on 11/20/2021 13:55:51 -------------------------------------------------------------------------------- Encounter Discharge Information Details Patient Name: Date of Service: [name], [name] 11/20/2021 1:00 PM Medical Record Number: [id number] Patient Account Number: [id number] Date of Birth/Sex: Treating RN: 10-10-1940 (81 y.o. [name] Primary Care Donterius Filley: [name] Other Clinician: [name] Referring Evonte Prestage: Treating Jaken Fregia/[name]:  Jimmy Footman in Treatment: 22 Encounter Discharge Information Items Discharge Condition: Stable Ambulatory Status: Ambulatory Discharge Destination: Home Transportation: Private Auto Accompanied By: self Schedule Follow-up Appointment: No Clinical Summary of Care: Electronic Signature(s) Signed: 11/22/2021 11:45:39 AM By: [name] CHT EMT BS , , Entered By: [name] on 11/20/2021 15:45:28 -------------------------------------------------------------------------------- Vitals Details Patient Name: Date of Service: [name], [name] 11/20/2021 1:00 PM Medical Record Number: [id number] Patient Account Number: [id number] Date of Birth/Sex: Treating RN: 1941-07-17 (81 y.o. [name] Primary Care Jersey Espinoza: [name] Other Clinician: [name] Referring Cabella Kimm: Treating Quantavis Obryant/[name]: Jimmy Footman in Treatment: 22 Vital Signs Time Taken: 13:05 Temperature (F): 97.5 Height (in): 64 Pulse (bpm): 72 Weight (lbs): 135 Respiratory Rate (breaths/min): 14 Body Mass Index (BMI): 23.2 Blood Pressure (mmHg): 148/58 Capillary Blood Glucose (mg/dl): 158 Reference Range: 80 - 120 mg / dl Airway Pulse Oximetry (%): 100 Electronic Signature(s) Signed: 11/20/2021 2:10:41 PM By: [name] CHT EMT BS , , Entered By: [name] on 11/20/2021 13:56:18

## 2021-11-21 ENCOUNTER — Encounter (HOSPITAL_BASED_OUTPATIENT_CLINIC_OR_DEPARTMENT_OTHER): Payer: Medicare Other | Admitting: Internal Medicine

## 2021-11-21 ENCOUNTER — Other Ambulatory Visit: Payer: Self-pay | Admitting: *Deleted

## 2021-11-21 DIAGNOSIS — M86671 Other chronic osteomyelitis, right ankle and foot: Secondary | ICD-10-CM

## 2021-11-21 DIAGNOSIS — E11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: Secondary | ICD-10-CM | POA: Diagnosis not present

## 2021-11-21 DIAGNOSIS — L97528 Non-pressure chronic ulcer of other part of left foot with other specified severity: Secondary | ICD-10-CM | POA: Diagnosis not present

## 2021-11-21 DIAGNOSIS — L97514 Non-pressure chronic ulcer of other part of right foot with necrosis of bone: Secondary | ICD-10-CM | POA: Diagnosis not present

## 2021-11-21 DIAGNOSIS — L97428 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: Secondary | ICD-10-CM | POA: Diagnosis not present

## 2021-11-21 DIAGNOSIS — E11621 Type 2 diabetes mellitus with foot ulcer: Secondary | ICD-10-CM

## 2021-11-21 DIAGNOSIS — I739 Peripheral vascular disease, unspecified: Secondary | ICD-10-CM

## 2021-11-21 LAB — GLUCOSE, CAPILLARY
Glucose-Capillary: 181 mg/dL — ABNORMAL HIGH (ref 70–99)
Glucose-Capillary: 167 mg/dL — ABNORMAL HIGH (ref 70–99)

## 2021-11-21 NOTE — Progress Notes (Signed)
LASHELLE, KOY ([id number]) Visit Report for 11/15/2021 SuperBill Details Patient Name: Date of Service: [name] 11/15/2021 Medical Record Number: [id number] Patient Account Number: [id number] Date of Birth/Sex: Treating RN: 22-Sep-1941 (81 y.o. 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, right ankle and foot Facility Procedures CPT4 Code Description Modifier Quantity 25427062 G0277-(Facility Use Only) HBOT full body chamber, 7min , 4 ICD-10 Diagnosis Description 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, right ankle and foot Physician Procedures Quantity CPT4 Code Description Modifier [id number] 51761 - WC PHYS HYPERBARIC OXYGEN THERAPY 1 ICD-10 Diagnosis Description 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

## 2021-11-22 ENCOUNTER — Other Ambulatory Visit: Payer: Self-pay

## 2021-11-22 ENCOUNTER — Encounter (HOSPITAL_BASED_OUTPATIENT_CLINIC_OR_DEPARTMENT_OTHER): Payer: Medicare Other | Admitting: General Surgery

## 2021-11-22 ENCOUNTER — Encounter (HOSPITAL_BASED_OUTPATIENT_CLINIC_OR_DEPARTMENT_OTHER): Payer: Medicare Other | Attending: Internal Medicine | Admitting: General Surgery

## 2021-11-22 ENCOUNTER — Other Ambulatory Visit: Payer: Self-pay | Admitting: Vascular Surgery

## 2021-11-22 ENCOUNTER — Other Ambulatory Visit: Payer: Self-pay | Admitting: Endocrinology

## 2021-11-22 ENCOUNTER — Other Ambulatory Visit: Payer: Self-pay | Admitting: Cardiovascular Disease

## 2021-11-22 DIAGNOSIS — T8131XD Disruption of external operation (surgical) wound, not elsewhere classified, subsequent encounter: Secondary | ICD-10-CM | POA: Insufficient documentation

## 2021-11-22 DIAGNOSIS — L97514 Non-pressure chronic ulcer of other part of right foot with necrosis of bone: Secondary | ICD-10-CM | POA: Insufficient documentation

## 2021-11-22 DIAGNOSIS — M86671 Other chronic osteomyelitis, right ankle and foot: Secondary | ICD-10-CM | POA: Insufficient documentation

## 2021-11-22 DIAGNOSIS — L97512 Non-pressure chronic ulcer of other part of right foot with fat layer exposed: Secondary | ICD-10-CM | POA: Diagnosis not present

## 2021-11-22 DIAGNOSIS — E11621 Type 2 diabetes mellitus with foot ulcer: Secondary | ICD-10-CM | POA: Insufficient documentation

## 2021-11-22 DIAGNOSIS — E11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: Secondary | ICD-10-CM | POA: Insufficient documentation

## 2021-11-22 DIAGNOSIS — X58XXXD Exposure to other specified factors, subsequent encounter: Secondary | ICD-10-CM | POA: Diagnosis not present

## 2021-11-22 LAB — GLUCOSE, CAPILLARY
Glucose-Capillary: 188 mg/dL — ABNORMAL HIGH (ref 70–99)
Glucose-Capillary: 137 mg/dL — ABNORMAL HIGH (ref 70–99)

## 2021-11-22 NOTE — Progress Notes (Signed)
NAYANNA, SEABORN ([id number]) Visit Report for 11/20/2021 SuperBill Details Patient Name: Date of Service: [name] 11/20/2021 Medical Record Number: [id number] Patient Account Number: [id number] Date of Birth/Sex: Treating RN: Feb 17, 1941 (81 y.o. 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, right ankle and foot Facility Procedures CPT4 Code Description Modifier Quantity [id number] G0277-(Facility Use Only) HBOT full body chamber, 59min , 4 ICD-10 Diagnosis Description 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, right ankle and foot T81.31XD Disruption of external operation (surgical) wound, not elsewhere classified, subsequent encounter Physician Procedures Quantity CPT4 Code Description Modifier [id number] 81188 - WC PHYS HYPERBARIC OXYGEN THERAPY 1 ICD-10 Diagnosis Description 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

## 2021-11-22 NOTE — Progress Notes (Signed)
[name], [name] ([id number]) ?Visit Report for 11/22/2021 ?HBO Details ?Patient Name: Date of Service: ?[name], [name] 11/22/2021 1:00 PM ?Medical Record Number: [id number] ?Patient Account Number: [id number] ?Date of Birth/Sex: Treating RN: ?1940/11/29 (81 y.o. 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 ?HBO Treatment Details ?Treatment Number: 12 ?Patient Type: Outpatient ?Chamber Type: Monoplace ?Chamber Serial #: [id number] ?Treatment Protocol: 2.0 ATA with 90 minutes oxygen, and no air breaks ?Treatment Details ?Compression Rate Down: 2.0 psi / minute ?De-Compression Rate Up: 2.0 psi / minute ?Air ?breaks ?and ?breathing Decompress Decompress ?Compress Tx Pressure ?Begins Reached periods Begins Ends ?(leave ?unused ?spaces ?blank) ?Chamber Pressure (ATA 1 2 ------2 1 ?) ?Clock Time (24 hr) 12:57 13:07 - - - - - - 14:37 14:47 ?Treatment Length: 110 (minutes) ?Treatment Segments: 4 ?Vital Signs ?Capillary Blood Glucose Reference Range: 80 - 120 mg / dl ?HBO Diabetic Blood Glucose Intervention Range: <131 mg/dl or >249 mg/dl ?Time Vitals Blood Respiratory Capillary Blood Glucose Pulse Action ?Type: ?Pulse: Temperature: ?Taken: ?Pressure: ?Rate: ?Glucose (mg/dl): ?Meter #: Oximetry (%) Taken: ?Pre 12:50 134/56 68 16 97.9 188 ?Post 14:49 138/60 70 16 98.2 137 2 100 manual BP ?Treatment Response ?Treatment Toleration: Well ?Treatment Completion Status: Treatment Completed without Adverse Event ?Treatment  Notes ?Patient traveled at a rate of 1 psi/min until reaching 2 psi at which time patient confirmed proper equalization of ears and travel rate was increased to 2 psi/min ?until reaching treatment depth of 2 ATA. ?Patient traveled at 2 psi/min during the decompression phase of treatment until reaching ambient pressure. Patient states she feels well. ?Additional Procedure Documentation ?Tissue Sevierity: Fat layer exposed ?Physician HBO Attestation: ?I certify that I supervised this HBO treatment in accordance with Medicare ?guidelines. A trained emergency response team is readily available per Yes ?hospital policies and procedures. ?Continue HBOT as ordered. Yes ?Electronic Signature(s) ?Signed: 11/22/2021 4:56:19 PM By: [name] MD FACS ?Previous Signature: 11/22/2021 4:37:08 PM Version By: [name] CHT EMT BS ?, , ?Entered By: [name] on 11/22/2021 16:56:19 ?-------------------------------------------------------------------------------- ?HBO Safety Checklist Details ?Patient Name: ?Date of Service: ?[name], [name] 11/22/2021 1:00 PM ?Medical Record Number: [id number] ?Patient Account Number: [id number] ?Date of Birth/Sex: ?Treating RN: ?1941/08/03 (81 y.o. [name], [name] ?Primary Care Aayliah Rotenberry: [name] ?Other Clinician: [name] ?Referring Zhi Geier: ?Treating Mckenley Birenbaum/[name]: [name] ?[name] ?Weeks in Treatment: 22 ?HBO Safety Checklist Items ?Safety Checklist ?Consent Form Signed ?Patient voided / foley secured and emptied ?When did you last eato 1215 ?Last dose of injectable or oral agent 0815 ?Ostomy pouch emptied and vented if applicable ?NA ?All implantable devices assessed, documented and approved ?NA ?Intravenous access site secured and place Right Arm PICC ?Valuables secured ?Linens and cotton and cotton/polyester blend (less than 51% polyester) ?Personal oil-based products / skin lotions / body lotions removed ?Wigs or hairpieces removed  Patient removed. ?Smoking or tobacco materials removed ?NA ?Books / newspapers / magazines / loose paper removed ?Cologne, aftershave, perfume and deodorant removed ?Jewelry removed (may wrap wedding band) ?Make-up removed ?Hair care products removed ?Battery operated devices (external) removed ?Heating patches and chemical warmers  removed ?Titanium eyewear removed ?Nail polish cured greater than 10 hours ?Casting material cured greater than 10 hours ?NA ?Hearing aids removed ?NA ?Loose dentures or partials removed ?NA ?Prosthetics have been removed ?NA ?Patient demonstrates correct use of air break device (if applicable) ?Patient concerns have been addressed ?Patient grounding bracelet on and cord attached to chamber ?Specifics for Inpatients (complete in addition to above) ?Medication sheet sent with patient ?NA ?Intravenous medications needed or due during therapy sent with patient ?NA ?Drainage tubes (e.g. nasogastric tube or chest tube secured and vented) ?NA ?Endotracheal or Tracheotomy tube secured ?NA ?Cuff deflated of air and inflated with saline ?NA ?Airway suctioned ?NA ?Notes ?Transcribed from paper version used prior to treatment. ?Electronic Signature(s) ?Signed: 11/22/2021 4:34:02 PM By: [name] CHT EMT BS ?, , ?Entered By: [name] on 11/22/2021 16:34:02 ?

## 2021-11-22 NOTE — Progress Notes (Signed)
CHELISE, HANGER ([id number]) Visit Report for 11/22/2021 Chief Complaint Document Details Patient Name: Date of Service: [name] 11/22/2021 3:00 PM Medical Record Number: [id number] Patient Account Number: [id number] Date of Birth/Sex: Treating RN: 02-24-1941 (81 y.o. F) Primary Care Provider: [name] Other Clinician: Referring Provider: Treating Provider/[name]: Mardi Mainland in Treatment: 22 Information Obtained from: Patient Chief Complaint 07/13/2019; patient is here for review wounds on the toes of her right foot and the left fourth toe 08/04/2020; patient is here for review of wounds at the anterior part of her right first met head/TMA site and another area on the right medial calf at a vein harvest site. Electronic Signature(s) Signed: 11/22/2021 3:49:23 PM By: [name] MD FACS Entered By: [name] on 11/22/2021 15:49:23 -------------------------------------------------------------------------------- Debridement Details Patient Name: Date of Service: [name], [name] 11/22/2021 3:00 PM Medical Record Number: [id number] Patient Account Number: [id number] Date of Birth/Sex: Treating RN: 01-Jul-1941 (81 y.o. [name] Primary Care Provider: [name] Other Clinician: Referring Provider: Treating Provider/[name]: Mardi Mainland in Treatment: 22 Debridement Performed for Assessment: 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 Performed By: Physician [name], MD Debridement Type: Debridement Severity of Tissue Pre Debridement: Fat layer exposed Level of Consciousness (Pre-procedure): Awake and Alert Pre-procedure Verification/Time Out Yes - 15:43 Taken: Start Time: 15:43 T Area Debrided (L x W): otal 0.4 (cm) x 0.8 (cm) = 0.32 (cm) Tissue and other material debrided: Non-Viable, Callus, Skin: Epidermis, Biofilm Level: Skin/Epidermis Debridement  Description: Selective/Open Wound Instrument: Curette Bleeding: None End Time: 15:45 Procedural Pain: 0 Post Procedural Pain: 0 Response to Treatment: Procedure was tolerated well Level of Consciousness (Post- Awake and Alert procedure): Post Debridement Measurements of Total Wound Length: (cm) 0.4 Width: (cm) 0.8 Depth: (cm) 0.1 Volume: (cm) 0.025 Character of Wound/Ulcer Post Debridement: Improved Severity of Tissue Post Debridement: Fat layer exposed Post Procedure Diagnosis Same as Pre-procedure Electronic Signature(s) Signed: 11/22/2021 3:55:59 PM By: [name] MD FACS Signed: 11/22/2021 5:58:06 PM By: [name] RN, BSN Entered By: [name] on 11/22/2021 15:45:53 -------------------------------------------------------------------------------- HPI Details Patient Name: Date of Service: [name], [name] 11/22/2021 3:00 PM Medical Record Number: [id number] Patient Account Number: [id number] Date of Birth/Sex: Treating RN: April 24, 1941 (81 y.o. F) Primary Care Provider: [name] Other Clinician: Referring Provider: Treating Provider/[name]: Mardi Mainland in Treatment: 81 History of Present Illness HPI Description: ADMISSION 07/13/2019 Patient is a 81 year old type II diabetic. She has known PAD. She has been followed by Dr. Jacqualyn Posey of podiatry for blistering areas on her toes dating back to 04/30/2019 which at this was the left fourth toe. By 8/11 she had wounds on the right and left second toes. She underwent arterial studies by Dr. Alvester Chou on 7/31 that showed ABIs on the right of 0.60 TBI of 0.26 on the left ABI of 0.56 and a TBI of 0.25. By 9/10 she had ischemic-looking wounds per Dr. Earleen Newport on the right first, left first second and third. She has been using Medihoney and then mupirocin more recently simply Betadine. The patient underwent angiography by Dr. Gwenlyn Found on 9/21. On the right this showed 90 to 95% calcified distal  right common femoral artery stenosis and a 95% focal mid SFA stenosis followed by an 80% segmental stenosis. Noted that there was 1 vessel runoff in the foot via the peroneal. On the left there was an 80% left SFA, 70% mid left SFA. There was a short  segment calcified CTO distal less than SFA and above-knee popliteal artery reconstituting with two-vessel runoff. 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 It was felt that she had bilateral SFA disease as well as tibial vessel disease. An attempt was made to revascularize the left SFA but they were unable to cross because of the highly calcified nature of the lesion. 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 She has an area on the medial left fourth toe with raised horned callus on top of this. I am not certain what this represents. With regards to pain she has about a 2-1/2 I will claudication tolerance in the grocery store. She has some pain in night which is relieved by putting her feet down over the side of the bed. She is wearing Nitro-Dur patches on the top of her right foot. Past medical history includes type 2 diabetes with secondary PAD, neoplasm of the skin, diabetic retinopathy, carotid artery stenosis, hyperlipidemia hypertension. 11/2; the last time the patient was here I spoke to Dr. Gwenlyn Found about revascularization options on the right. As I understand things currently Dr. Gwenlyn Found spoke with Dr. Trula Slade and ultimately the patient was taken to surgery on 10/27. She had a right iliofemoral endarterectomy with a bovine patch angioplasty. I think the plan now is for her to have a staged intervention on the right SFA by Dr. Alvester Chou. Per the patient's understanding Dr. Gwenlyn Found and Dr. Jacqualyn Posey are waiting to see when the gangrenous toes on the right foot can be amputated. The patient states her pain is a lot better and she is  grateful for that. 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 Small area on the left fourth toe. She is using Betadine to the mummified areas on the right and Medihoney on the left 08/27/2019; the last time I saw this patient I discharged her from the clinic. She had been revascularized by Dr. Trula Slade and she had a right iliofemoral endarterectomy with a bovine angioplasty. 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 Jacqualyn Posey of podiatry on 08/07/2019. I note that she also had a intervention by Dr. Gwenlyn Found and he performed a directional atherectomy and drug-coated balloon angioplasty of the SFA and popliteal artery on the right. I am not certain of the date of Dr. Kennon Holter procedure as of the time of this dictation. She was referred back to [place] by Dr. Earleen Newport predominantly for follow-up of the left fourth toe. She still has sutures and stitches in the right TMA site. She states her pain is a lot better. She expresses concern about the condition of the amputation site at the TMA. She is on doxycycline I think prescribed by Dr. Earleen Newport. She is complaining of some pain at night 12/10; I spoke to Dr. Jacqualyn Posey last week. He removed the sutures on the right foot on Monday of this week. She has the area on the left fourth toe just proximal to the PIP and then the right TMA site. She is still on doxycycline and has enough through next week. Unfortunately the TMA site does not look good at all. Both on the lateral and medial part of the incisions are areas that probe to bone. There is purulence over the medial part which I have cultured. We will use silver alginate. Left fourth toe looks somewhat better  but there was still exposed bone 12/17; patient has an MRI booked for 12/30. Culture I did last week showed Pseudomonas Serratia and Enterococcus. This was purulent drainage coming out of the medial part of her amputation site. I use  cefdinir 300 twice daily for 10 days that started on 12/15. Her x-ray on the right showed limited evaluation for osteomyelitis. The findings could have been postoperative. There was subtle erosion in the distal first and distal fifth metatarsal. An MRI was suggested. On the left she had irregularity of the fourth middle and proximal phalanx consistent with a history of osteomyelitis. I do not know that she has a history of osteomyelitis in this area. She had a newly defined area on the plantar third toe 12/31; patient's MRI is listed below: MRI OF THE RIGHT FOREFOOT WITHOUT AND WITH CONTRAST TECHNIQUE: Multiplanar, multisequence MR imaging of the right forefoot was performed before and after the administration of intravenous contrast. CONTRAST 6 mL GADAVIST IV SOLN : COMPARISON: Plain films right foot 09/04/2019. FINDINGS: Bones/Joint/Cartilage The patient is status post transmetatarsal amputation as seen on the prior exam. Marrow edema and enhancement are seen in all of the distal metatarsals. In the first metatarsal, signal change extends 1.5 cm proximal to the stump and in the second metatarsal extends approximately 2 cm proximal to the stump. Edema and enhancement are seen in only the distal 0.7 cm of the third metatarsal stump and tips of the fourth and fifth metatarsals. Bone marrow signal is otherwise unremarkable. A small focus of subchondral edema is seen in the lateral talus, likely degenerative. Ligaments Intact. Muscles and Tendons No intramuscular fluid collection. Soft tissues Skin ulceration is seen off the stump of the first metatarsal. A thin fluid collection tracks deep to the wound and over the anterior metatarsals worrisome for small abscess. Intense subcutaneous edema and enhancement are seen diffusely. IMPRESSION: Status post transmetatarsal amputation. Findings consistent with osteomyelitis are seen in the distal metatarsals, most extensive in the first and  second as described above. Cellulitis about the foot. Skin ulceration over the distal first metatarsal is identified with a thin fluid collection tracking anteriorly along the stump worrisome for abscess. Small focus of subchondral edema in the lateral talus is likely degenerative. Electronically Signed By: Inge Rise M.D. On: 09/23/2019 15:25 Patient arrives in clinic today not complaining any of any pain. 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 She does not describe claudication however her activity is very limited. 10/01/2019; since the patient was last here I have communicated with Dr. Gwenlyn Found after bypass by Dr. Trula Slade and addressing the right superficial femoral artery he states that she is widely patent through peroneal arteries to the ankle with collaterals to the dorsalis pedis. He states he is going to talk to colleagues about the feasibility of tibial pedal access. The patient seems infectious disease later this afternoon Dr. Baxter Flattery. in preparation for this she has been off antibiotics for 1 week and I went ahead and obtain pieces of the remanence of her first metatarsal for pathology and CandS. The patient is a candidate for hyperbaric oxygen. 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 1/15; considerable improvement in the wounds on her feet. We are using silver collagen. She follows with Dr. Baxter Flattery of infectious disease and is on meropenem and daptomycin. She has been taught how to do this herself at home. Follows with Dr. Graylon Good at  the end of treatment here. She has 2 wounds on the surgical TMA site 1 lateral and 1 medial the lateral 1 has regressed tremendously. The area medially still has some exposed bone although the base of this looks healthy. 1/22; 2 separate wounds on the surgical TMA site. Both of these look satisfactory. The medial area does not  have any exposed bone. This is an improvement On the left side fourth toe dorsally over the proximal phalanx there is a deep punched out area that probes to bone. She has an area on the tip of the left third toe. She also tells [place] that in HBO she traumatized her left Achilles and this is left her with a superficial wound area 1/28; weekly visit along with HBO. She has 5 wounds. T our punched out areas on the original TMA site on the right. Both of these appear to have contracted. o The area on the right no longer has exposed bone. She has an area on the tip of the left third toe and on the DIP of the left fourth toe. Both of these had surface callus that I removed and unfortunately they have small areas that both go to bone. She has a traumatic wound on the left Achilles area that happened in HBO and that appears better. She is tolerating her IV antibiotics well at home. She has home health changing the dressings and she is doing it once on the weekends. We have been using silver collagen. She has been extensively revascularized on the right by Dr. Trula Slade and subsequently by Dr. Gwenlyn Found. 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 Baxter Flattery of infectious disease on 2/17. Dr. Gwenlyn Found on 2/23 and according to her her antibiotics stopped on 2/23. She is tolerating hyperbarics well. She has made a tremendous improvement in the right forefoot with only 2 smaller open wounds. Using silver collagen. On the left foot she has the area on the tip of the left third toe and the medial part of the left fourth toe. These had exposed bone last week I did not sense any of that today 2/12; sees Dr. Graylon Good on 2/17 and Dr. Gwenlyn Found on 2/23. We are going to use Dermagraft on this today however the lateral part of her train TMA incision on the right is healed and the medial part is down so much that  we just continue with the silver collagen She has wounds on the tip of her left third and the medial aspect of the left fourth. Both of these still have exposed bone I have not been able to get these to epithelialize. 2/19; she sees Dr. Gwenlyn Found on 2/23 and I have communicated with him about the left vascular supply. Looking at her angiogram from 08/03/2019 it looks as though that they could not cross the left CFA. Noted that she had one-vessel runoff bilaterally. She also apparently saw Dr. Graylon Good although I did not look up these results for preparation of this record. 2/26; Dr. Gwenlyn Found is going to do an angiogram next week on Wednesday. I think they are going to try to go at this both retrograde and anterograde to see if anything can be done to revascularize the left lower limb. She continues to make nice progress on the remaining wound on the right medial foot on her TMA site and the toe it wounds  on the left are responding nicely as well 3/12; the patient underwent an extensive and complicated revascularization on the left leg by Dr. Fletcher Anon on 3/3; she had an atherectomy on occluded left popliteal artery; anterior tibial artery followed by a balloon angioplasty. Atherectomy was also performed to the left SFA because there was still significant 50% stenosis in the left popliteal artery they performed an intravascular lithotripsy which improved the residual stenosis to 20%. The same lithotripsy was used to dilate the proximal left SFA. She had a drug-eluting stent placed I believe in the SFA. The patient returned for hyperbarics this week. She has had some eye problems on the right which she tells me are secondary to diabetic retinopathy and she saw her eye doctor. She is really made excellent progress there is no open wounds on the left foot at all. She has one open area medially and her TMA site on the right lateral wound is closed. 3/19; the patient comes in today with the area on the medial transmet  on the right improved. Some debris removed from the surface revealed the still open wound. Unfortunately she now has the open area on the third toe tip and the medial aspect of the dorsal fourth toe. These are in the same location as her previous wounds. These had actually closed up last week. 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 She is undergone complex revascularizations on the right by Dr. Trula Slade and Dr. Gwenlyn Found and I believe Dr. 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 She completed IVantibiotics as directed by infectious disease and I believe a course of oral antibiotics as well.she underwent a complete 40 treatments of hyperbarics. She comes in today with the vast majority of the right TMA site healed there is a small opening on the most medial aspect. Her left third and left fourth toes appear to have epithelialization although this has fluctuated somewhat. 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 She had a gaping wound on the right transmetatarsal amputation L as well as deep wounds on the left third and left fourth toes. She underwent complex revascularizations on her bilateral lower legs which I described on her last visit. She completed IV antibiotics by infectious disease and 40 treatments of hyperbarics. Her left third and fourth toes are healed. The area on the right transmetatarsal amputation site is also closed today. READMISSION 08/04/2020 [name] is now a 81 year old woman that we had for a complex stay in our clinic from October 2000 14 January 2020. 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 Her current problems with wounds began on October 30 when she developed a spontaneous opening over the right transmet medial aspect over the first metatarsal head. She has been using collagen on this that she had leftover from her last stay in this clinic. She also has had an open area on the medial right calf from a vein harvest site from her CABG she. She said that this is never really closed since the vein harvest was done. With regards to her  arterial status this is a major issue. She had an angiogram by Dr. Gwenlyn Found in September 2020. She developed a gangrenous right first and second toes. Ultimately she was referred to Dr. Trula Slade who did a right common femoral endarterectomy with patch angioplasty. This was on 07/21/2019 and she really had a good result. Dr. Gwenlyn Found did a atherectomy followed by a drug coated balloon angioplasty of her right SFA popliteal and tibial peroneal trunk on 08/03/2019 again with excellent excellent results. Follow-up.Dopplers in November revealed revealed a widely patent SFA and tibioperoneal trunk. UNFORTUNATELY her recent angiogram that was done on 08/01/2020 showed an 80% right common femoral artery stenosis just above the previous endarterectomy site occluded right right SFA at the origin reconstituting the adductor canal by the profunda femoris collaterals. The profunda femoris is also diffusely diseased. There is and again another 80% tibioperoneal trunk stenosis and one-vessel runoff via the peroneal artery. She is seen Dr. Donzetta Matters and she is scheduled for a femoral endarterectomy and right femoral-popliteal bypass grafting with endarterectomy of her tibial peroneal trunk. The scheduled surgery is on 11/23 The patient does not complain of pain at rest. She does have 5-minute walking claudication. The wounds on her right first met head remanent was spontaneous she does not think she hit this. As mentioned in  the area on her right medial calf is a vein harvest site Dr. Gwenlyn Found had texted me about getting her in the clinic and we have arranged this as soon as we can. I have told her that we will have to see how successful Dr. Donzetta Matters is before we know what can be done about the wounds in a precise fashion. Fortunately there does not seem to be any obvious infection involving the right foot although that may need to be looked at if things really deteriorate. She was treated with IV antibiotics and hyperbaric oxygen for her previous osteomyelitis 12/2; the patient underwent a right femoral endarterectomy and bypass femoral to popliteal artery. This was done on 08/16/2020. She is done remarkably well. She has a small area on the right anterior tibia and a small area in the medial part of her original right transmetatarsal amputation. She appears to have had an excellent response to surgery. 12/16; the patient's area on the right tibia and the small area on the medial part of her original right transmetatarsal amputation is totally healed READMISSION 06/15/2021 [name] is a now 81 year old woman who arrives for review of wounds on the left TMA site extending into her plantar foot as well as an area on the left posterior calcaneus. 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 She was extensively revascularized on the right at that time as noted above in our previous notes ended up receiving IV antibiotics and hyperbaric oxygen therapy. Miraculously this TMA site actually closed and she really has had not any trouble with this since. UNFORTUNATELY the same cannot be said of her left side. Her problem apparently started sometime in July when she hit her left third toe on a barstool while playing pool. She developed an acute infection which eventually led to underlying osteomyelitis. She underwent an extensive hospitalization  from 04/27/2021 through 05/15/2021. She received IV antibiotics. Also extensive intervention by Dr. Stanford Breed of vein and vascular which included a left common femoral to tibioperoneal trunk bypass as well as a left third toe amputation on 05/03/2021. Unfortunately the area did not heal  she ended up requiring a left TMA. By her description this was left open. She has been using a wound VAC on this to earlier this week and then was switched to Santyl. I think because of the left forefoot off loader she is also developed an area on the left posterior calcaneus. She is not complaining of a lot of pain. She has her daughter at home to change the dressings. She has been prescribed Santyl and she has a tube that she is brought into our clinic. I cannot get a lot of history of claudication although she is really not that mobile at this point walking with a walker. 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 She has had multiple interventions on the right side by vein and vascular and also by Dr. Gwenlyn Found as well as a transmetatarsal amputation on the right. She has had follow-up noninvasive studies on 06/06/2021; on the left that showed an ABI of 0.47 with monophasic waveforms a dorsalis pedis ABI of 0.40 with monophasic waveforms. Her ANGIOGRAM which was initially done by Dr. Oneida Alar on 04/28/2021 showed severe left foot superficial femoral artery occlusive disease and an occlusion of the left popliteal artery with two-vessel runoff to the left foot with moderate to severe disease below the knee popliteal artery and tibial disease. She had 80% distal right external iliac artery above the existing femoral above-knee popliteal bypass but she does not have an open wound on the right foot She arrives in clinic with an extensive open area on the TMA site which in the mid aspect extends  into the plantar foot. She also has a more superficial area on the upper part of her Achilles area of the left heel 9/29; patient arrives in clinic today with slough over the TMA site. We use MolecuLight to look at the surface of this suggesting cyan and white discoloration over a large part of this wound also extending into the third metatarsal area. She also has the area on the left posterior calcaneus. This is slough covered we will switch to [facility] here as well. There is some warmth in the area and some erythema we will give her antibiotics as well 10/6; completely necrotic surface once again on the left TMA site. We have been using Santyl and Hydrofera Blue. She has an area on the left posterior Achilles and actually states this has been more painful this week indeed there is some erythema around this area. She also describes I think claudication at rest which is relieved by dipping her left leg over the side of the bed at night. 10/13; our intake nurse was able to brush some slough off the surface of the left TMA hence this did not require debridement that is better than last week. The area heading towards the plantar part of her foot required an aggressive debridement to clean this up. This is quite a sizable divot but does not go to bone. The area on the right Achilles heel is also covered in a high 100% nonviable tissue we have been using Santyl and silver alginate 10/20; the area on the left TMA looks much better. Even the area is spreading into the midfoot looks improved. The right Achilles still is covered and nonviable surface 100%. We have been using Santyl and silver alginate and this will continue for this week The patient is approved for 5 Apligraf's  and I think she is good enough to start using these next week 10/27; the patient's wounds actually look quite good. We have been using Santyl and silver alginate. We applied Apligraf #1 today Saw Dr. Stanford Breed her vascular surgeon on 10/25  he told her TMA was healing nicely. She will follow-up with them in December to repeat noninvasive testing. 08/16/2021 upon evaluation today patient appears to be doing much better in regard to her wounds. Both are showing signs of improvement which is great news. I am going to perform some debridement to clear away some of the necrotic debris currently this will just be a light debridement. Nonetheless other than that I feel like that the Apligraf is doing quite well working and reapply today and this will be #3 as far as the application is concerned. 12/20; since the patient was last here she was seen by Dr. Stanford Breed who felt that her Doppler flow was sluggish in the bypass. She therefore went underwent a urgent angiogram on 09/07/2021 by Dr. Carlis Abbott. She had an angioplasty of the distal anastomosis and the anterior tibial artery. She tolerated this well. She had no flow-limiting stenosis in the aortoiliac segment on the left. Some calcification in the left common iliac artery but it was not flow-limiting the common femoral and profunda were patent. The left lower extremity bypass had brisk flow with no significant proximal anastomosis on the common femoral artery. Distally there was a high-grade stenosis where the bypass anastomosis was performed. This was angioplastied as well as the anterior tibial artery. 12/28; the patient comes in early because of a wound over the tip of her right first metatarsal head. She is not really sure how this happened. Says she noticed it when she took her sock off on Friday before she got in the shower. She has been using Hydrofera Blue to this area. This is the original wounded site we dealt with in the setting of a previous right TMA. The patient has PA I researched this on [place] link. As it turns out she has had recent arterial studies noninvasive on 09/05/2021. On the right she had dampened monophasic waveforms at the popliteal absent waveforms at the PTA and  dorsalis pedis. An ABI could not be obtained previously at 0.47. The comment states that they were unable to detect Doppler waveforms of the posterior tibial and dorsalis pedis arteries. 2023 09/26/2021; her original left TMA wound is almost closed. I reapplied Apligraf to the remaining area. The area on the plantar foot is also closed as far as I can tell HOWEVER she comes in with a second wound on the right first metatarsal head remanent both of these probe to bone. I did a bone scraping of this last week which only showed Corynebacterium unlikely to be a true pathogen. Nevertheless I am going to put her on Augmentin today probably for 2 weeks. X-ray will be done today. She has an appointment with Dr. Gwenlyn Saran at vein and vascular next Tuesday I will send him a note about my concerns about the right TM 1/10; we inadvertently remove the Apligraf from the left surgical site however everything looks closed here. She still has 2 probing wounds over the remanent of her first metatarsal head both go to bone although there is less erythema and swelling in the area. She does not have an appointment with Dr. Donzetta Matters until next Tuesday. I am going to go ahead and order an MRI of the right foot I had originally planned to  have or at least consider HBO if the patient has underlying osteomyelitis in the right foot however she tells me that she lost a significant amount of her visual field during her last run with HBO in the right eye nasal aspect. She saw Dr. Zadie Rhine who is a retinal specialist I will see if I can look at his records. She is still on Augmentin as finishes on Monday. 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 She has been on Augmentin with some improvement in the surrounding erythema although the small wound on the tip and the  plantar aspect still probe easily to bone and there does not appear to be any viable tissue. UNFORTUNATELY arrives in clinic today with new areas on the right lateral foot neither 1 of these look particularly healthy. No debridement. The patient has an appoint with Dr. Donzetta Matters this morning my question is angiography of the right lego. I also wonder about an infectious disease appointment and I will continue her Augmentin but refer her for consideration. I did not get a culture that was positive even from a bone scraping however. 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 She saw her retina doctor Dr. Deloria Lair and her interpretation of this was that the hyperbarics may have contributed to a blockage. I wonder whether this was a central retinal vein or artery occlusion. I am not sure that Dr. Zadie Rhine documents in epic I will need to see if I can find his note 1/25; this is [place] returns to clinic having a very eventful week. Firstly she underwent angiography by Dr. Stanford Breed of vein and vascular.. Her preintervention ABI was 0. She underwent a right iliofemoral drug-coated balloon angioplasty, right popliteal drug-coated balloon angioplasty and a right tibial peroneal trunk and peroneal angioplasty by Dr. Stanford Breed. She tolerated this well. She definitely has better blood flow to the right foot. She saw Dr. Doren Custard in follow-up who felt her Doppler flow in the right foot was good. An x-ray did not show any evidence of osteomyelitis however I noted a previous MRI certainly did. She also saw infectious diseases been started on IV daptomycin and ertapenem. She had the PICC line placed. I had started her on Augmentin on February 3 and that had some improvement in the erythema and swelling over the remanent  of her first metatarsal head. As far as I am aware she did not have a specific organism cultured at least not by me although I did try. Everything on the left is closed including the left transmit amputation site and the area on her left posterior heel/Achilles. We are using silver collagen on the right foot now which has 2 open wounds on the right first metatarsal head and the right fifth metatarsal head 2/1; the patient's left foot transmit site and the area on her heel remain closed. Some eschar but I do not think this requires debridement She has areas on the right first and right fifth met heads underlying osteomyelitis she is on IV daptomycin and ertapenem. The areas are small but probe to bone. I have debrided with a #3 curette hemostasis with  direct pressure I am able to get to some decent looking surrounding granulation tissue on the more worrisome areas on the right first metatarsal head 2/8; left foot transverse metatarsal amputation site which was the original wound we were dealing with this in the clinic remains closed looks very healthy even that he part that extended towards her plantar foot in the mid aspect of the incision. Her right transmetatarsal amputation site appears less erythematous and less swollen however the wound on the first met head probes to bone. The bone itself does not feel that vibrant. The wounds on the fifth metatarsal head also on the right have eschar on the surface I did not consider debriding this today. 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 Dr. 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 We will therefore pursue hyperbaric oxygen therapy and treatment of the underlying refractory  osteomyelitis 2/15; the patient's right foot actually looks a lot better 2 areas over the fifth met head are closed. 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 She is on IV antibiotics and has been approved for hyperbarics. In the meantime the whole right first met head remanent looks a lot better with antibiotics less swelling and less erythema but still with the open area probing to bone 2/22; saw Dr. Luan Pulling and then intravascular yesterday. She apparently had her arterial studies done. I have not reviewed these today however he stated he was well satisfied. She is still on IV antibiotics and tolerating hyperbaric oxygen well. Her largest wound is a small open area of the probes the bone over her right fifth metatarsal head remnant. She's been using silver collagen 11/22/2021: She continues on IV antibiotics but states that her last dose will be next Monday. She is tolerating hyperbaric oxygen therapy without difficulty. The wound over her first metatarsal head continues to contract. There is crusty nonviable tissue overlying this. This was debrided with a curette. 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 I removed this and was relieved that there is no open wound underneath. Electronic Signature(s) Signed: 11/22/2021 3:52:04 PM By: [name] MD FACS Entered By: [name] on 11/22/2021 15:52:04 -------------------------------------------------------------------------------- Physical Exam Details Patient Name: Date of Service: [name], [name] 11/22/2021 3:00 PM Medical Record Number: [id number] Patient Account Number: [id number] Date of Birth/Sex: Treating RN: [birth year] (81 y.o. F) Primary Care Provider: [name] Other Clinician: Referring Provider: Treating Provider/[name]: Mardi Mainland in  Treatment: 22 Constitutional . . . No acute distress. Respiratory Normal work of breathing on room air. Notes 11/22/2021: Wound examthe hemorrhagic callus that was seen last week has closed. The right first metatarsal head remnant wound no longer probes to bone. No evidence of infection and the wound continues to contract. Electronic Signature(s) Signed: 11/22/2021 3:53:46 PM By: [name] MD FACS Entered By: [name] on 11/22/2021 15:53:46 -------------------------------------------------------------------------------- Physician Orders Details Patient Name: Date of Service: [name], [name] 11/22/2021 3:00 PM Medical Record Number: [id number] Patient Account Number: [id number] Date of Birth/Sex: Treating RN: 1941/09/04 (81 y.o. 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 - Dr. Celine Ahr - Prior to HBO Return A Bathing/ Shower/ Hygiene May shower with protection but do not get wound dressing(s) wet. Edema Control - Lymphedema / SCD / Other Elevate legs to the level of the heart or above for 30 minutes daily and/or when sitting, a frequency of: - throughout the day Avoid standing for long periods of time. Home Health No change in wound care orders this week; continue Home Health for wound care. May utilize formulary equivalent dressing  for wound treatment orders unless otherwise specified. Dressing changes to be completed by [place] on Monday / Wednesday / Friday except when patient has scheduled visit at [facility]. 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 Follow Hyperbaric Oxygen Glycemia Protocol Afrin (Oxymetazoline HCL) 0.05% nasal spray - 1 spray in both nostrils daily as needed prior to HBO treatment for difficulty clearing ears Wound Treatment Wound #15 - Amputation Site - Transmetatarsal Wound Laterality: Right Cleanser: Soap and Water Every Other Day/30 Days Discharge Instructions: May shower and wash wound with dial antibacterial soap and water prior to dressing change. Cleanser: Wound Cleanser [facility]) Every Other Day/30 Days Discharge Instructions: Cleanse the wound with wound cleanser prior to applying a clean dressing using gauze sponges, not tissue or cotton balls. Prim Dressing: Promogran Prisma Matrix, 4.34 (sq in) (silver collagen) (Home Health) Every Other Day/30 Days ary Discharge Instructions: Moisten collagen with [place] Jelly Secondary Dressing: Woven Gauze Sponges 2x2 in [facility]) Every Other Day/30 Days Discharge Instructions: Apply over primary dressing as directed. Secured With: [employer], 4.5x3.1 (in/yd) [facility]) Every Other Day/30 Days Discharge Instructions: Secure with Kerlix as directed. Secured With: Transpore Surgical Tape, 2x10 (in/yd) [facility]) Every Other Day/30 Days Discharge Instructions: Secure dressing with tape as directed. GLYCEMIA INTERVENTIONS PROTOCOL PRE-HBO GLYCEMIA  INTERVENTIONS ACTION INTERVENTION Obtain pre-HBO capillary blood glucose (ensure 1 physician order is in chart). A. Notify HBO physician and await physician orders. 2 If result is 70 mg/dl or below: B. If the result meets the hospital definition of a critical result, follow hospital policy. A. Give patient an 8 ounce Glucerna Shake, an 8 ounce Ensure, or 8 ounces of a Glucerna/Ensure equivalent dietary supplement*. B. Wait 30 minutes. If result is 71 mg/dl to 130 mg/dl: C. Retest patients capillary blood glucose (CBG). D. If result greater than or equal to 110 mg/dl, proceed with HBO. If result less than 110 mg/dl, notify HBO physician and consider holding HBO. If result is 131 mg/dl to 249 mg/dl: A. Proceed with HBO. A. Notify HBO physician and await physician orders. B. It is recommended to hold HBO and do If result is 250 mg/dl or greater: blood/urine ketone  testing. C. If the result meets the hospital definition of a critical result, follow hospital policy. POST-HBO GLYCEMIA INTERVENTIONS ACTION INTERVENTION Obtain post HBO capillary blood glucose (ensure 1 physician order is in chart). A. Notify HBO physician and await physician orders. 2 If result is 70 mg/dl or below: B. If the result meets the hospital definition of a critical result, follow hospital policy. A. Give patient an 8 ounce Glucerna Shake, an 8 ounce Ensure, or 8 ounces of a Glucerna/Ensure equivalent dietary supplement*. B. Wait 15 minutes for symptoms of If result is 71 mg/dl to 100 mg/dl: hypoglycemia (i.e. nervousness, anxiety, sweating, chills, clamminess, irritability, confusion, tachycardia or dizziness). C. If patient asymptomatic, discharge patient. If patient symptomatic, repeat capillary blood glucose (CBG) and notify HBO physician. If result is 101 mg/dl to 249 mg/dl: A. Discharge patient. A. Notify HBO physician and await physician orders. B. It is recommended to do  blood/urine ketone If result is 250 mg/dl or greater: testing. C. If the result meets the hospital definition of a critical result, follow hospital policy. *Juice or candies are NOT equivalent products. If patient refuses the Glucerna or Ensure, please consult the hospital dietitian for an appropriate substitute. Electronic Signature(s) Signed: 11/22/2021 3:55:59 PM By: [name] MD FACS Entered By: [name] on 11/22/2021 15:54:14 -------------------------------------------------------------------------------- Problem List Details Patient Name: Date of Service: [name], [name] 11/22/2021 3:00 PM Medical Record Number: [id number] Patient Account Number: [id number] Date of Birth/Sex: Treating RN: Jun 19, 1941 (81 y.o. [name] Primary Care Provider: [name] Other Clinician: Referring Provider: Treating Provider/[name]: Mardi Mainland in Treatment: 22 Active Problems ICD-10 Encounter Code Description Active Date MDM Diagnosis E11.621 Type 2 diabetes mellitus with foot ulcer 06/15/2021 No Yes T81.31XD Disruption of external operation (surgical) wound, not elsewhere classified, 06/15/2021 No Yes subsequent encounter L97.514 Non-pressure chronic ulcer of other part of right foot with necrosis of bone 09/20/2021 No Yes E11.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 06/15/2021 No Yes 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, right ankle and foot 10/25/2021 No Yes Inactive Problems ICD-10 Code Description Active Date Inactive Date L97.428 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 11/22/2021 3:00 PM Medical Record Number: [id number] Patient Account Number: [id number] Date of Birth/Sex: Treating RN: 09/10/1941 (81 y.o. F) Primary Care Provider: [name] Other Clinician: Referring Provider: Treating Provider/[name]: Mardi Mainland in Treatment: 22 Subjective Chief Complaint Information obtained from Patient 07/13/2019; patient is here for review wounds on the toes of her right foot and the left fourth toe 08/04/2020; patient is here for review of wounds at the anterior part of her right first met head/TMA site and another area on the right medial calf at a vein harvest site. History of Present Illness (HPI) ADMISSION 07/13/2019 Patient is a 81 year old type II diabetic. She has known PAD. She has been followed by Dr. Jacqualyn Posey of podiatry for blistering areas on her toes dating back to 04/30/2019 which at this was the left fourth toe. By 8/11 she had wounds on the right and left second toes. She underwent arterial studies by Dr. Alvester Chou on 7/31 that showed ABIs on the right  of 0.60 TBI of 0.26 on the left ABI of 0.56 and a TBI of 0.25. By 9/10 she had ischemic-looking wounds per Dr. Earleen Newport on the right first, left first second and third. She has been using Medihoney and then mupirocin more recently simply Betadine. The patient underwent angiography by Dr. Gwenlyn Found on 9/21. 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 Noted that there was 1 vessel runoff in the foot via the peroneal. ooOn the left there was an 80% left SFA, 70% mid left SFA. There was a short segment calcified CTO distal less than SFA and above-knee popliteal artery reconstituting with two-vessel  runoff. 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 It was felt that she had bilateral SFA disease as well as tibial vessel disease. An attempt was made to revascularize the left SFA but they were unable to cross because of the highly calcified nature of the lesion. 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 She has an area on the medial left fourth toe with raised horned callus on top of this. I am not certain what this represents. With regards to pain she has about a 2-1/2 I will claudication tolerance in the grocery store. She has some pain in night which is relieved by putting her feet down over the side of the bed. She is wearing Nitro-Dur patches on the top of her right foot. Past medical history includes type 2 diabetes with secondary PAD, neoplasm of the skin, diabetic retinopathy, carotid artery stenosis, hyperlipidemia hypertension. 11/2; the last time the patient was here I spoke to Dr. Gwenlyn Found about revascularization options on the right. As I understand things currently Dr. Gwenlyn Found spoke with Dr. Trula Slade and ultimately the patient was taken to surgery on 10/27. She had a right iliofemoral endarterectomy with a bovine patch angioplasty. I think the plan now is for her to have a staged intervention on the right SFA by Dr. Alvester Chou. Per the patient's understanding Dr. Gwenlyn Found and Dr. Jacqualyn Posey are waiting to see when the gangrenous toes on the right foot can be amputated. The patient states her pain is a lot better and she is grateful for that. 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 Small area on the left fourth toe. She is using Betadine to the mummified areas on the right and Medihoney on the left 08/27/2019; the last time I saw this patient I discharged her from the clinic. She had been revascularized by Dr. Trula Slade and she had a right  iliofemoral endarterectomy with a bovine angioplasty. 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 Jacqualyn Posey of podiatry on 08/07/2019. I note that she also had a intervention by Dr. Gwenlyn Found and he performed a directional atherectomy and drug-coated balloon angioplasty of the SFA and popliteal artery on the right. I am not certain of the date of Dr. Kennon Holter procedure as of the time of this dictation. She was referred back to [place] by Dr. Earleen Newport predominantly for follow-up of the left fourth toe. She still has sutures and stitches in the right TMA site. She states her pain is a lot better. She expresses concern about the condition of the amputation site at the TMA. She is on doxycycline  I think prescribed by Dr. Earleen Newport. She is complaining of some pain at night 12/10; I spoke to Dr. Jacqualyn Posey last week. He removed the sutures on the right foot on Monday of this week. She has the area on the left fourth toe just proximal to the PIP and then the right TMA site. She is still on doxycycline and has enough through next week. Unfortunately the TMA site does not look good at all. Both on the lateral and medial part of the incisions are areas that probe to bone. There is purulence over the medial part which I have cultured. We will use silver alginate. Left fourth toe looks somewhat better but there was still exposed bone 12/17; patient has an MRI booked for 12/30. Culture I did last week showed Pseudomonas Serratia and Enterococcus. This was purulent drainage coming out of the medial part of her amputation site. I use cefdinir 300 twice daily for 10 days that started on 12/15. Her x-ray on the right showed limited evaluation for osteomyelitis. The findings could have been postoperative. There was subtle erosion in the distal first and distal fifth metatarsal. An MRI was suggested. On the left she had irregularity of the fourth middle and proximal phalanx consistent with a  history of osteomyelitis. I do not know that she has a history of osteomyelitis in this area. She had a newly defined area on the plantar third toe 12/31; patient's MRI is listed below: MRI OF THE RIGHT FOREFOOT WITHOUT AND WITH CONTRAST TECHNIQUE: Multiplanar, multisequence MR imaging of the right forefoot was performed before and after the administration of intravenous contrast. CONTRAST 6 mL GADAVIST IV SOLN : COMPARISON: Plain films right foot 09/04/2019. FINDINGS: Bones/Joint/Cartilage The patient is status post transmetatarsal amputation as seen on the prior exam. Marrow edema and enhancement are seen in all of the distal metatarsals. In the first metatarsal, signal change extends 1.5 cm proximal to the stump and in the second metatarsal extends approximately 2 cm proximal to the stump. Edema and enhancement are seen in only the distal 0.7 cm of the third metatarsal stump and tips of the fourth and fifth metatarsals. Bone marrow signal is otherwise unremarkable. A small focus of subchondral edema is seen in the lateral talus, likely degenerative. Ligaments Intact. Muscles and Tendons No intramuscular fluid collection. Soft tissues Skin ulceration is seen off the stump of the first metatarsal. A thin fluid collection tracks deep to the wound and over the anterior metatarsals worrisome for small abscess. Intense subcutaneous edema and enhancement are seen diffusely. 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 Cellulitis about the foot. Skin ulceration over the distal first metatarsal is identified with a thin fluid collection tracking anteriorly along the stump worrisome for abscess. Small focus of subchondral edema in the lateral talus is likely degenerative. Electronically Signed By: Inge Rise M.D. On: 09/23/2019  15:25 Patient arrives in clinic today not complaining any of any pain. 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 She does not describe claudication however her activity is very limited. 10/01/2019; since the patient was last here I have communicated with Dr. Gwenlyn Found after bypass by Dr. Trula Slade and addressing the right superficial femoral artery he states that she is widely patent through peroneal arteries to the ankle with collaterals to the dorsalis pedis. He states he is going to talk to  colleagues about the feasibility of tibial pedal access. The patient seems infectious disease later this afternoon Dr. Baxter Flattery. in preparation for this she has been off antibiotics for 1 week and I went ahead and obtain pieces of the remanence of her first metatarsal for pathology and CandS. The patient is a candidate for hyperbaric oxygen. 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 1/15; considerable improvement in the wounds on her feet. We are using silver collagen. She follows with Dr. Baxter Flattery of infectious disease and is on meropenem and daptomycin. She has been taught how to do this herself at home. Follows with Dr. Graylon Good at the end of treatment here. She has 2 wounds on the surgical TMA site 1 lateral and 1 medial the lateral 1 has regressed tremendously. The area medially still has some exposed bone although the base of this looks healthy. 1/22; 2 separate wounds on the surgical TMA site. Both of these look satisfactory. The medial area does not have any exposed bone. This is an improvement On the left side fourth toe dorsally over the proximal phalanx there is a deep punched out area that probes to bone. She has an area on the tip of the left third toe. She also tells [place] that in HBO she traumatized her left Achilles and this is left her with a superficial wound area 1/28; weekly visit  along with HBO. She has 5 wounds. T our punched out areas on the original TMA site on the right. Both of these appear to have contracted. o The area on the right no longer has exposed bone. She has an area on the tip of the left third toe and on the DIP of the left fourth toe. Both of these had surface callus that I removed and unfortunately they have small areas that both go to bone. She has a traumatic wound on the left Achilles area that happened in HBO and that appears better. She is tolerating her IV antibiotics well at home. She has home health changing the dressings and she is doing it once on the weekends. We have been using silver collagen. She has been extensively revascularized on the right by Dr. Trula Slade and subsequently by Dr. Gwenlyn Found. 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 Baxter Flattery of infectious disease on 2/17. Dr. Gwenlyn Found on 2/23 and according to her her antibiotics stopped on 2/23. She is tolerating hyperbarics well. She has made a tremendous improvement in the right forefoot with only 2 smaller open wounds. Using silver collagen. On the left foot she has the area on the tip of the left third toe and the medial part of the left fourth toe. These had exposed bone last week I did not sense any of that today 2/12; sees Dr. Graylon Good on 2/17 and Dr. Gwenlyn Found on 2/23. We are going to use Dermagraft on this today however the lateral part of her train TMA incision on the right is healed and the medial part is down so much that we just continue with the silver collagen She has wounds on the tip of her left third and the medial aspect of the left fourth. Both of these still have exposed bone I have not been able to get these to epithelialize. 2/19; she sees Dr. Gwenlyn Found on 2/23 and I have communicated with him about  the left vascular supply. Looking at her angiogram from  08/03/2019 it looks as though that they could not cross the left CFA. Noted that she had one-vessel runoff bilaterally. She also apparently saw Dr. Graylon Good although I did not look up these results for preparation of this record. 2/26; Dr. Gwenlyn Found is going to do an angiogram next week on Wednesday. I think they are going to try to go at this both retrograde and anterograde to see if anything can be done to revascularize the left lower limb. She continues to make nice progress on the remaining wound on the right medial foot on her TMA site and the toe it wounds on the left are responding nicely as well 3/12; the patient underwent an extensive and complicated revascularization on the left leg by Dr. Fletcher Anon on 3/3; she had an atherectomy on occluded left popliteal artery; anterior tibial artery followed by a balloon angioplasty. Atherectomy was also performed to the left SFA because there was still significant 50% stenosis in the left popliteal artery they performed an intravascular lithotripsy which improved the residual stenosis to 20%. The same lithotripsy was used to dilate the proximal left SFA. She had a drug-eluting stent placed I believe in the SFA. The patient returned for hyperbarics this week. She has had some eye problems on the right which she tells me are secondary to diabetic retinopathy and she saw her eye doctor. She is really made excellent progress there is no open wounds on the left foot at all. She has one open area medially and her TMA site on the right lateral wound is closed. 3/19; the patient comes in today with the area on the medial transmet on the right improved. Some debris removed from the surface revealed the still open wound. ooUnfortunately she now has the open area on the third toe tip and the medial aspect of the dorsal fourth toe. These are in the same location as her previous wounds. These had actually closed up last week. 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 She is undergone complex revascularizations on the right by Dr. Trula Slade and Dr. Gwenlyn Found and I believe Dr. 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 She completed IVantibiotics as directed by infectious disease and I believe a course of oral antibiotics as well.she underwent a complete 40 treatments of hyperbarics. She comes in today with the vast majority of the right TMA site healed there is a small opening on the most medial aspect. Her left third and left fourth toes appear to have epithelialization although this has fluctuated somewhat. 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 She had a gaping wound on the right transmetatarsal amputation L as well as deep wounds on the left third and left fourth toes. She underwent complex revascularizations on her bilateral lower legs which I described on her last visit. She completed IV antibiotics by infectious disease and 40 treatments of hyperbarics. Her left third and fourth toes are healed. The area on the right transmetatarsal amputation site is also closed today. READMISSION 08/04/2020 [name] is now a 81 year old woman that we had for a complex stay in our clinic from October 2000 14 January 2020. She is a type II diabetic  with PAD. She has undergone a right transmetatarsal amputation. 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 Her current problems with wounds began on October 30 when she developed a spontaneous opening over the right transmet medial aspect over the first metatarsal head. She has been using collagen on this that she had leftover from her last stay in this clinic. She also has had an  open area on the medial right calf from a vein harvest site from her CABG she. She said that this is never really closed since the vein harvest was done. With regards to her arterial status this is a major issue. She had an angiogram by Dr. Gwenlyn Found in September 2020. She developed a gangrenous right first and second toes. Ultimately she was referred to Dr. Trula Slade who did a right common femoral endarterectomy with patch angioplasty. This was on 07/21/2019 and she really had a good result. Dr. Gwenlyn Found did a atherectomy followed by a drug coated balloon angioplasty of her right SFA popliteal and tibial peroneal trunk on 08/03/2019 again with excellent excellent results. Follow-up.Dopplers in November revealed revealed a widely patent SFA and tibioperoneal trunk. UNFORTUNATELY her recent angiogram that was done on 08/01/2020 showed an 80% right common femoral artery stenosis just above the previous endarterectomy site occluded right right SFA at the origin reconstituting the adductor canal by the profunda femoris collaterals. The profunda femoris is also diffusely diseased. There is and again another 80% tibioperoneal trunk stenosis and one-vessel runoff via the peroneal artery. She is seen Dr. Donzetta Matters and she is scheduled for a femoral endarterectomy and right femoral-popliteal bypass grafting with endarterectomy of her tibial peroneal trunk. The scheduled surgery is on 11/23 The patient does not complain of pain at rest. She does have 5-minute walking claudication. The wounds on her right first met head remanent was spontaneous she does not think she hit this. As mentioned in the area on her right medial calf is a vein harvest site Dr. Gwenlyn Found had texted me about getting her in the clinic and we have arranged this as soon as we can. I have told her that we will have to see how successful Dr. Donzetta Matters is before we know what can be done about the wounds in a precise fashion. Fortunately there does not seem to be any  obvious infection involving the right foot although that may need to be looked at if things really deteriorate. She was treated with IV antibiotics and hyperbaric oxygen for her previous osteomyelitis 12/2; the patient underwent a right femoral endarterectomy and bypass femoral to popliteal artery. This was done on 08/16/2020. She is done remarkably well. She has a small area on the right anterior tibia and a small area in the medial part of her original right transmetatarsal amputation. She appears to have had an excellent response to surgery. 12/16; the patient's area on the right tibia and the small area on the medial part of her original right transmetatarsal amputation is totally healed READMISSION 06/15/2021 [name] is a now 81 year old woman who arrives for review of wounds on the left TMA site extending into her plantar foot as well as an area on the left posterior calcaneus. 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 She was extensively revascularized on the right at that time as noted above in our previous notes ended up receiving  IV antibiotics and hyperbaric oxygen therapy. Miraculously this TMA site actually closed and she really has had not any trouble with this since. UNFORTUNATELY the same cannot be said of her left side. Her problem apparently started sometime in July when she hit her left third toe on a barstool while playing pool. She developed an acute infection which eventually led to underlying osteomyelitis. She underwent an extensive hospitalization from 04/27/2021 through 05/15/2021. She received IV antibiotics. Also extensive intervention by Dr. Stanford Breed of vein and vascular which included a left common femoral to tibioperoneal trunk bypass as well as a left third toe amputation on 05/03/2021. Unfortunately the area did not heal she ended up requiring a left TMA. By her description this  was left open. She has been using a wound VAC on this to earlier this week and then was switched to Santyl. I think because of the left forefoot off loader she is also developed an area on the left posterior calcaneus. She is not complaining of a lot of pain. She has her daughter at home to change the dressings. She has been prescribed Santyl and she has a tube that she is brought into our clinic. I cannot get a lot of history of claudication although she is really not that mobile at this point walking with a walker. 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 She has had multiple interventions on the right side by vein and vascular and also by Dr. Gwenlyn Found as well as a transmetatarsal amputation on the right. She has had follow-up noninvasive studies on 06/06/2021; on the left that showed an ABI of 0.47 with monophasic waveforms a dorsalis pedis ABI of 0.40 with monophasic waveforms. Her ANGIOGRAM which was initially done by Dr. Oneida Alar on 04/28/2021 showed severe left foot superficial femoral artery occlusive disease and an occlusion of the left popliteal artery with two-vessel runoff to the left foot with moderate to severe disease below the knee popliteal artery and tibial disease. She had 80% distal right external iliac artery above the existing femoral above-knee popliteal bypass but she does not have an open wound on the right foot She arrives in clinic with an extensive open area on the TMA site which in the mid aspect extends into the plantar foot. She also has a more superficial area on the upper part of her Achilles area of the left heel 9/29; patient arrives in clinic today with slough over the TMA site. We use MolecuLight to look at the surface of this suggesting cyan and white discoloration over a large part of this wound also extending into the third  metatarsal area. She also has the area on the left posterior calcaneus. This is slough covered we will switch to [facility] here as well. There is some warmth in the area and some erythema we will give her antibiotics as well 10/6; completely necrotic surface once again on the left TMA site. We have been using Santyl and Hydrofera Blue. She has an area on the left posterior Achilles and actually states this has been more painful this week indeed there is some erythema around this area. She also describes I think claudication at rest which is relieved by dipping her left leg over the side of the bed at night. 10/13; our intake nurse was able to brush some slough off the surface of the left TMA hence this did not  require debridement that is better than last week. The area heading towards the plantar part of her foot required an aggressive debridement to clean this up. This is quite a sizable divot but does not go to bone. The area on the right Achilles heel is also covered in a high 100% nonviable tissue we have been using Santyl and silver alginate 10/20; the area on the left TMA looks much better. Even the area is spreading into the midfoot looks improved. The right Achilles still is covered and nonviable surface 100%. We have been using Santyl and silver alginate and this will continue for this week The patient is approved for 5 Apligraf's and I think she is good enough to start using these next week 10/27; the patient's wounds actually look quite good. We have been using Santyl and silver alginate. We applied Apligraf #1 today Saw Dr. Stanford Breed her vascular surgeon on 10/25 he told her TMA was healing nicely. She will follow-up with them in December to repeat noninvasive testing. 08/16/2021 upon evaluation today patient appears to be doing much better in regard to her wounds. Both are showing signs of improvement which is great news. I am going to perform some debridement to clear away some of the necrotic  debris currently this will just be a light debridement. Nonetheless other than that I feel like that the Apligraf is doing quite well working and reapply today and this will be #3 as far as the application is concerned. 12/20; since the patient was last here she was seen by Dr. Stanford Breed who felt that her Doppler flow was sluggish in the bypass. She therefore went underwent a urgent angiogram on 09/07/2021 by Dr. Carlis Abbott. She had an angioplasty of the distal anastomosis and the anterior tibial artery. She tolerated this well. She had no flow-limiting stenosis in the aortoiliac segment on the left. Some calcification in the left common iliac artery but it was not flow-limiting the common femoral and profunda were patent. The left lower extremity bypass had brisk flow with no significant proximal anastomosis on the common femoral artery. Distally there was a high-grade stenosis where the bypass anastomosis was performed. This was angioplastied as well as the anterior tibial artery. 12/28; the patient comes in early because of a wound over the tip of her right first metatarsal head. She is not really sure how this happened. Says she noticed it when she took her sock off on Friday before she got in the shower. She has been using Hydrofera Blue to this area. This is the original wounded site we dealt with in the setting of a previous right TMA. The patient has PA I researched this on [place] link. As it turns out she has had recent arterial studies noninvasive on 09/05/2021. On the right she had dampened monophasic waveforms at the popliteal absent waveforms at the PTA and dorsalis pedis. An ABI could not be obtained previously at 0.47. The comment states that they were unable to detect Doppler waveforms of the posterior tibial and dorsalis pedis arteries. 2023 09/26/2021; her original left TMA wound is almost closed. I reapplied Apligraf to the remaining area. The area on the plantar foot is also closed as  far as I can tell HOWEVER she comes in with a second wound on the right first metatarsal head remanent both of these probe to bone. I did a bone scraping of this last week which only showed Corynebacterium unlikely to be a true pathogen. Nevertheless I am going to put  her on Augmentin today probably for 2 weeks. X-ray will be done today. She has an appointment with Dr. Gwenlyn Saran at vein and vascular next Tuesday I will send him a note about my concerns about the right TM 1/10; we inadvertently remove the Apligraf from the left surgical site however everything looks closed here. She still has 2 probing wounds over the remanent of her first metatarsal head both go to bone although there is less erythema and swelling in the area. She does not have an appointment with Dr. Donzetta Matters until next Tuesday. I am going to go ahead and order an MRI of the right foot I had originally planned to have or at least consider HBO if the patient has underlying osteomyelitis in the right foot however she tells me that she lost a significant amount of her visual field during her last run with HBO in the right eye nasal aspect. She saw Dr. Zadie Rhine who is a retinal specialist I will see if I can look at his records. She is still on Augmentin as finishes on Monday. 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 She has been on Augmentin with some improvement in the surrounding erythema although the small wound on the tip and the plantar aspect still probe easily to bone and there does not appear to be any viable tissue. UNFORTUNATELY arrives in clinic today with new areas on the right lateral foot neither 1 of these look particularly healthy. No debridement. The patient has an appoint with Dr. Donzetta Matters this morning my question is angiography of the right lego. I also  wonder about an infectious disease appointment and I will continue her Augmentin but refer her for consideration. I did not get a culture that was positive even from a bone scraping however. 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 She saw her retina doctor Dr. Deloria Lair and her interpretation of this was that the hyperbarics may have contributed to a blockage. I wonder whether this was a central retinal vein or artery occlusion. I am not sure that Dr. Zadie Rhine documents in epic I will need to see if I can find his note 1/25; this is [name] returns to clinic having a very eventful week. Firstly she underwent angiography by Dr. Stanford Breed of vein and vascular.. Her preintervention ABI was 0. She underwent a right iliofemoral drug-coated balloon angioplasty, right popliteal drug-coated balloon angioplasty and a right tibial peroneal trunk and peroneal angioplasty by Dr. Stanford Breed. She tolerated this well. She definitely has better blood flow to the right foot. She saw Dr. Doren Custard in follow-up who felt her Doppler flow in the right foot was good. An x-ray did not show any evidence of osteomyelitis however I noted a previous MRI certainly did. She also saw infectious diseases been started on IV daptomycin and ertapenem. She had the PICC line placed. I had started her on Augmentin on February 3 and that had some improvement in the erythema and swelling over the remanent of her first metatarsal head. As far as I am aware she did not have a specific organism cultured at least not by me although  I did try. Everything on the left is closed including the left transmit amputation site and the area on her left posterior heel/Achilles. We are using silver collagen on the right foot now which has 2 open wounds on  the right first metatarsal head and the right fifth metatarsal head 2/1; the patient's left foot transmit site and the area on her heel remain closed. Some eschar but I do not think this requires debridement She has areas on the right first and right fifth met heads underlying osteomyelitis she is on IV daptomycin and ertapenem. The areas are small but probe to bone. 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 Her right transmetatarsal amputation site appears less erythematous and less swollen however the wound on the first met head probes to bone. The bone itself does not feel that vibrant. The wounds on the fifth metatarsal head also on the right have eschar on the surface I did not consider debriding this today. 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 Dr. 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 We will therefore pursue hyperbaric oxygen therapy and treatment of the underlying refractory osteomyelitis 2/15; the patient's right foot actually looks a lot better 2 areas over the fifth met head are closed. 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 She is on IV antibiotics and has been approved for hyperbarics. In the meantime the whole right first met head remanent looks a lot better with  antibiotics less swelling and less erythema but still with the open area probing to bone 2/22; saw Dr. Luan Pulling and then intravascular yesterday. She apparently had her arterial studies done. I have not reviewed these today however he stated he was well satisfied. She is still on IV antibiotics and tolerating hyperbaric oxygen well. Her largest wound is a small open area of the probes the bone over her right fifth metatarsal head remnant. She's been using silver collagen 11/22/2021: She continues on IV antibiotics but states that her last dose will be next Monday. She is tolerating hyperbaric oxygen therapy without difficulty. The wound over her first metatarsal head continues to contract. There is crusty nonviable tissue overlying this. This was debrided with a curette. 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 I removed this and was relieved that there is no open wound underneath. Patient History Information obtained from Patient. Family History Cancer - Mother, Diabetes - Maternal Grandparents, Hypertension - Mother, Stroke - Mother,Maternal Grandparents, No family history of Heart Disease, Hereditary Spherocytosis, Kidney Disease, Lung Disease, Seizures, Thyroid Problems, Tuberculosis. Social History Former smoker, Marital Status - Widowed, Alcohol Use - Never, Drug Use - No History, Caffeine Use - Daily. 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, Peripheral Arterial Disease Denies history of Angina, Arrhythmia, Congestive Heart Failure, Hypotension, Peripheral Venous Disease, Phlebitis, Vasculitis Gastrointestinal Denies history of Cirrhosis , Colitis, Crohnoos, Hepatitis A, Hepatitis B, Hepatitis C Endocrine Patient has history of Type II Diabetes Denies history of Type I Diabetes Genitourinary Denies history of End Stage Renal Disease Immunological Denies history of Lupus Erythematosus, Raynaudoos, Scleroderma Integumentary (Skin) Denies history of History of Burn Musculoskeletal Denies history of Gout, Rheumatoid Arthritis, Osteoarthritis, Osteomyelitis Neurologic Denies history of Dementia, Neuropathy, Quadriplegia, Paraplegia, Seizure Disorder Oncologic Denies history of Received Chemotherapy, Received Radiation Psychiatric Denies history of Anorexia/bulimia, Confinement Anxiety Medical A Surgical History Notes nd Cardiovascular CABG x 5 Objective Constitutional No acute distress. Vitals Time Taken: 3:08 PM, Height: 64 in, Weight: 135 lbs, BMI: 23.2, Temperature: 98.2 F, Pulse: 71 bpm, Respiratory Rate: 18 breaths/min, Blood Pressure: 138/60 mmHg, Capillary Blood Glucose: 137 mg/dl. Respiratory Normal work of breathing on room air. General Notes: 11/22/2021: Wound examoothe hemorrhagic callus that was seen last week has closed. The right first metatarsal head remnant wound no longer probes to bone. No evidence of infection and the wound continues to contract. Integumentary (Hair, Skin) Wound #15 status is Open. Original cause of wound was Other Lesion. The date acquired was: 09/15/2021. The wound has been in treatment 9 weeks. 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 The wound measures 0.4cm length x 0.8cm width x 0.1cm depth; 0.251cm^2 area and 0.025cm^3 volume. There is Fat Layer (Subcutaneous Tissue)  exposed. There is no tunneling or undermining noted. There is a medium amount of serosanguineous drainage noted. The wound margin is distinct with the outline attached to the wound base. There is small (1-33%) pale granulation within the wound bed. There is a large (67-100%) amount of necrotic tissue within the wound bed including Adherent Slough. Wound #19 status is Healed - Epithelialized. Original cause of wound was Gradually Appeared. The date acquired was: 11/15/2021. The wound has been in treatment 1 weeks. The wound is located on the Right,Distal Amputation Site - Transmetatarsal. The wound measures 0cm length x 0cm width x 0cm depth; 0cm^2 area and 0cm^3 volume. There is no tunneling or undermining noted. There is a none present amount of drainage noted. There is no granulation within the wound bed. There is no necrotic tissue within the wound bed. 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 There was a Selective/Open Wound Skin/Epidermis Debridement with a total area of 0.32 sq cm performed by [name], MD. With the following instrument(s): Curette to remove Non-Viable tissue/material. Material removed includes Callus, Skin: Epidermis, and Biofilm. No specimens were taken. A time out was conducted at 15:43, prior to the start of the procedure. There  was no bleeding. The procedure was tolerated well with a pain level of 0 throughout and  a pain level of 0 following the procedure. Post Debridement Measurements: 0.4cm length x 0.8cm width x 0.1cm depth; 0.025cm^3 volume. Character of Wound/Ulcer Post Debridement is improved. Severity of Tissue Post Debridement is: Fat layer exposed. Post procedure Diagnosis Wound #15: Same as Pre-Procedure Plan Follow-up Appointments: Return Appointment in 2 weeks. - Dr. Celine Ahr - [facility] Bathing/ Shower/ Hygiene: May shower with protection but do not get wound dressing(s) wet. Edema Control - Lymphedema / SCD / Other: Elevate legs to the level of the heart or above for 30 minutes daily and/or when sitting, a frequency of: - throughout the day Avoid standing for long periods of time. Home Health: No change in wound care orders this week; continue Home Health for wound care. May utilize formulary equivalent dressing for wound treatment orders unless otherwise specified. Dressing changes to be completed by [place] on Monday / Wednesday / Friday except when patient has scheduled visit at [facility]. Other Home Health Orders/Instructions: - [facility] Hyperbaric Oxygen Therapy: Evaluate for HBO Therapy Indication: - Wagner 3 diabetic ulcer of right foot 2.0 ATA for 90 Minutes without Air Breaks T Number of Treatments: - 30 otal One treatments per day (delivered Monday through Friday unless otherwise specified in Special Instructions below): Finger stick Blood Glucose Pre- and Post- HBOT Treatment. Follow Hyperbaric Oxygen Glycemia Protocol Afrin (Oxymetazoline HCL) 0.05% nasal spray - 1 spray in both nostrils daily as needed prior to HBO treatment for difficulty clearing ears WOUND #15: - Amputation Site - Transmetatarsal Wound Laterality: Right Cleanser: Soap and Water Every Other Day/30 Days Discharge Instructions: May shower and wash wound with dial antibacterial soap and water prior to dressing change. Cleanser: Wound Cleanser [facility]) Every Other Day/30 Days Discharge  Instructions: Cleanse the wound with wound cleanser prior to applying a clean dressing using gauze sponges, not tissue or cotton balls. Prim Dressing: Promogran Prisma Matrix, 4.34 (sq in) (silver collagen) (Home Health) Every Other Day/30 Days ary Discharge Instructions: Moisten collagen with [place] Jelly Secondary Dressing: Woven Gauze Sponges 2x2 in [facility]) Every Other Day/30 Days Discharge Instructions: Apply over primary dressing as directed. Secured With: [employer], 4.5x3.1 (in/yd) [facility]) Every Other Day/30 Days Discharge Instructions: Secure with Kerlix as directed. Secured With: [employer] T ape, 2x10 (in/yd) (Home Health) Every Other Day/30 Days Discharge Instructions: Secure dressing with tape as directed. 11/22/2021: Really nice improvement under hyperbaric oxygen and IV antibiotic therapy. The right first metatarsal head wound is closing in nicely. She has no other open wounds at this time. Continue Prisma and hyperbaric therapy. She will complete her course of IV antibiotics this coming Monday. I would like to see her in 2 weeks' time. Electronic Signature(s) Signed: 11/22/2021 3:55:16 PM By: [name] MD FACS Entered By: [name] on 11/22/2021 15:55:15 -------------------------------------------------------------------------------- HxROS Details Patient Name: Date of Service: [name], [name] 11/22/2021 3:00 PM Medical Record Number: [id number] Patient Account Number: [id number] Date of Birth/Sex: Treating RN: Sep 14, 1941 (81 y.o. F) Primary Care Provider: [name] Other Clinician: Referring Provider: Treating Provider/[name]: Mardi Mainland in Treatment: 32 Information Obtained From Patient Eyes Medical History: Negative for: Cataracts; Glaucoma; Optic Neuritis Ear/Nose/Mouth/Throat Medical History: Negative for: Chronic sinus problems/congestion; Middle ear  problems Hematologic/Lymphatic Medical History: Negative for: Anemia; Hemophilia; Human Immunodeficiency Virus; Lymphedema; Sickle Cell Disease Respiratory Medical History: Negative for: Aspiration; Asthma; Chronic Obstructive Pulmonary  Disease (COPD); Pneumothorax; Sleep Apnea; Tuberculosis Cardiovascular Medical History: Positive for: Coronary Artery Disease; Deep Vein Thrombosis; Hypertension; 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; Peripheral Arterial Disease Negative for: Angina; Arrhythmia; Congestive Heart Failure; Hypotension; Peripheral Venous Disease; Phlebitis; Vasculitis Past Medical History Notes: CABG x 5 Gastrointestinal Medical History: Negative for: Cirrhosis ; Colitis; Crohns; Hepatitis A; Hepatitis B; Hepatitis C Endocrine Medical History: Positive for: Type II Diabetes Negative for: Type I Diabetes Time with diabetes: 68 Treated with: Oral agents Blood sugar tested every day: Yes Tested : daily Genitourinary Medical History: Negative for: End Stage Renal Disease Immunological Medical History: Negative for: Lupus Erythematosus; Raynauds; Scleroderma Integumentary (Skin) Medical History: Negative for: History of Burn Musculoskeletal Medical History: Negative for: Gout; Rheumatoid Arthritis; Osteoarthritis; Osteomyelitis Neurologic Medical History: Negative for: Dementia; Neuropathy; Quadriplegia; Paraplegia; Seizure Disorder Oncologic Medical History: Negative for: Received Chemotherapy; Received Radiation Psychiatric Medical History: Negative for: Anorexia/bulimia; Confinement Anxiety Immunizations Pneumococcal Vaccine: Received Pneumococcal Vaccination: No Implantable Devices None Family and Social History Cancer: Yes - Mother; Diabetes: Yes - Maternal Grandparents; Heart Disease: No; Hereditary Spherocytosis: No; Hypertension: Yes - Mother; Kidney Disease: No; Lung Disease: No; Seizures: No; Stroke: Yes - Mother,Maternal Grandparents; Thyroid  Problems: No; Tuberculosis: No; Former smoker; Marital Status - Widowed; Alcohol Use: Never; Drug Use: No History; Caffeine Use: Daily; Financial Concerns: No; Food, Clothing or Shelter Needs: No; Support System Lacking: No; Transportation Concerns: No Physician Affirmation I have reviewed and agree with the above information. Electronic Signature(s) Signed: 11/22/2021 3:55:59 PM By: [name] MD FACS Entered By: [name] on 11/22/2021 15:52:18 -------------------------------------------------------------------------------- SuperBill Details Patient Name: Date of Service: [name], [name] 11/22/2021 Medical Record Number: [id number] Patient Account Number: [id number] Date of Birth/Sex: Treating RN: 09/11/1941 (81 y.o. F) Primary Care Provider: [name] Other Clinician: Referring Provider: Treating Provider/[name]: Mardi Mainland in Treatment: 22 Diagnosis Coding ICD-10 Codes Code Description E11.621 Type 2 diabetes mellitus with foot ulcer T81.31XD Disruption of external operation (surgical) wound, not elsewhere classified, subsequent encounter L97.514 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, right ankle and foot Facility Procedures CPT4 Code: [id number] Description: 44619 - DEBRIDE WOUND 1ST 20 SQ CM OR < ICD-10 Diagnosis Description L97.514 Non-pressure chronic ulcer of other part of right foot with necrosis of bone Modifier: Quantity: 1 Physician Procedures : CPT4 Code Description Modifier [id number] 14643 - WC PHYS DEBR WO ANESTH 20 SQ CM ICD-10 Diagnosis Description L97.514 Non-pressure chronic ulcer of other part of right foot with necrosis of bone Quantity: 1 Electronic Signature(s) Signed: 11/22/2021 3:55:33 PM By: [name] MD FACS Entered By: [name]  on 11/22/2021 15:55:33

## 2021-11-22 NOTE — Progress Notes (Signed)
[name], [name] ([id number]) ?Visit Report for 11/22/2021 ?SuperBill Details ?Patient Name: Date of Service: ?[name], [name] 11/22/2021 ?Medical Record Number: [id number] ?Patient Account Number: [id number] ?Date of Birth/Sex: Treating RN: ?1941/05/14 (81 y.o. [name], [name] ?Primary Care Provider: [name] ?Other Clinician: [name] ?Referring Provider: ?Treating Provider/[name]: [name] ?[name] ?Weeks in Treatment: 22 ?Diagnosis Coding ?ICD-10 Codes ?Code Description ?E11.621 Type 2 diabetes mellitus with foot ulcer ?T81.31XD Disruption of external operation (surgical) wound, not elsewhere classified, subsequent encounter ?L97.514 Non-pressure chronic ulcer of other part of right foot with necrosis of bone ?E11.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 ?T46.219 Other chronic osteomyelitis, right ankle and foot ?Facility Procedures ?CPT4 Code Description Modifier Quantity ?[id number] G0277-(Facility Use Only) HBOT full body chamber, 84min ?, 4 ?ICD-10 Diagnosis Description ?E11.621 Type 2 diabetes mellitus with foot ulcer ?L97.514 Non-pressure chronic ulcer of other part of right foot with necrosis of bone ?S92.909 Other chronic osteomyelitis, right ankle and foot ?T81.31XD Disruption of external operation (surgical) wound, not elsewhere classified, subsequent encounter ?Physician Procedures ?Quantity ?CPT4 Code Description Modifier ?[id number] 69249 - WC PHYS HYPERBARIC OXYGEN THERAPY 1 ?ICD-10 Diagnosis Description ?E11.621 Type 2 diabetes mellitus with foot ulcer ?L97.514 Non-pressure chronic ulcer of other part of right foot with necrosis of bone ?J24.199 Other chronic osteomyelitis, right ankle and foot ?E11.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 ?Electronic Signature(s) ?Signed: 11/22/2021 4:56:33 PM By: [name] MD FACS ?Previous Signature: 11/22/2021 4:37:33 PM Version By: [name]  CHT EMT BS ?, , ?Entered By: [name] on 11/22/2021 16:56:32 ?

## 2021-11-22 NOTE — Progress Notes (Signed)
ARIAL, GALLIGAN ([id number]) Visit Report for 11/21/2021 SuperBill Details Patient Name: Date of Service: [name] 11/21/2021 Medical Record Number: [id number] Patient Account Number: [id number] Date of Birth/Sex: Treating RN: 12-Oct-1940 (81 y.o. [name] Primary Care Provider: [name] Other Clinician: Referring Provider: Treating Provider/[name]: Jimmy Footman in Treatment: 22 Diagnosis Coding ICD-10 Codes Code Description [phone] Type 2 diabetes mellitus with foot ulcer T81.31XD Disruption of external operation (surgical) wound, not elsewhere classified, subsequent encounter L97.514 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, right ankle and foot Facility Procedures CPT4 Code Description Modifier Quantity [id number] G0277-(Facility Use Only) HBOT full body chamber, 44min , 4 ICD-10 Diagnosis Description 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, right ankle and foot T81.31XD Disruption of external operation (surgical) wound, not elsewhere classified, subsequent encounter Physician Procedures Quantity CPT4 Code Description Modifier [id number] 81103 - WC PHYS HYPERBARIC OXYGEN THERAPY 1 ICD-10 Diagnosis Description 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, right ankle and foot T81.31XD Disruption of external operation (surgical) wound, not elsewhere classified, subsequent encounter Electronic Signature(s) Signed: 11/21/2021 4:25:55 PM By: [name] CHT EMT BS , , Signed: 11/22/2021 2:56:46 PM By: [name]  DO Entered By: [name] on 11/21/2021 16:25:55

## 2021-11-22 NOTE — Progress Notes (Signed)
AHONESTY, WOODFIN ([id number]) ?Visit Report for 11/22/2021 ?Arrival Information Details ?Patient Name: Date of Service: ?[name], [name] 11/22/2021 1:00 PM ?Medical Record Number: [id number] ?Patient Account Number: [id number] ?Date of Birth/Sex: Treating RN: ?[birth year] (81 y.o. [name], [name] ?Primary Care Ugonna Keirsey: [name] ?Other Clinician: [name] ?Referring Byanka Landrus: ?Treating Vina Byrd/[name]: [name] ?[name] ?Weeks in Treatment: 22 ?Visit Information History Since Last Visit ?All ordered tests and consults were completed: Yes ?Patient Arrived: Ambulatory ?Added or deleted any medications: No ?Arrival Time: 12:35 ?Any new allergies or adverse reactions: No ?Accompanied By: self ?Had a fall or experienced change in No ?Transfer Assistance: None ?activities of daily living that may affect ?Patient Identification Verified: Yes ?risk of falls: ?Secondary Verification Process Completed: Yes ?Signs or symptoms of abuse/neglect since last visito No ?Patient Requires Transmission-Based Precautions: No ?Hospitalized since last visit: No ?Patient Has Alerts: Yes ?Implantable device outside of the clinic excluding No ?Patient Alerts: Patient on Blood Thinner cellular tissue based products placed in the center ?L ABI: 0.82 since last visit: ?R ABI: 0.47 ?Pain Present Now: No ?Electronic Signature(s) ?Signed: 11/22/2021 4:32:36 PM By: [name] CHT EMT BS ?, , ?Previous Signature: 11/22/2021 4:31:10 PM Version By: [name] CHT EMT BS ?, , ?Entered By: [name] on 11/22/2021 16:32:36 ?-------------------------------------------------------------------------------- ?Encounter Discharge Information Details ?Patient Name: Date of Service: ?[name], [name] 11/22/2021 1:00 PM ?Medical Record Number: [id number] ?Patient Account Number: [id number] ?Date of Birth/Sex: Treating RN: ?1941-01-13 (81 y.o. [name], [name] ?Primary Care Shyvonne Chastang: [name] ?Other  Clinician: [name] ?Referring Izza Bickle: ?Treating Gentry Seeber/[name]: [name] ?[name] ?Weeks in Treatment: 22 ?Encounter Discharge Information Items ?Discharge Condition: Stable ?Ambulatory Status: Ambulatory ?Discharge Destination: Home ?Transportation: Private Auto ?Accompanied By: self ?Schedule Follow-up Appointment: No ?Clinical Summary of Care: ?Electronic Signature(s) ?Signed: 11/22/2021 4:38:14 PM By: [name] CHT EMT BS ?, , ?Entered By: [name] on 11/22/2021 16:38:13 ?-------------------------------------------------------------------------------- ?Vitals Details ?Patient Name: ?Date of Service: ?[name], [name] 11/22/2021 1:00 PM ?Medical Record Number: [id number] ?Patient Account Number: [id number] ?Date of Birth/Sex: ?Treating RN: ?Oct 23, 1940 (81 y.o. [name], [name] ?Primary Care Eliyohu Class: [name] ?Other Clinician: [name] ?Referring Namir Neto: ?Treating Koree Schopf/[name]: [name] ?[name] ?Weeks in Treatment: 22 ?Vital Signs ?Time Taken: 12:50 ?Temperature (??F): 97.9 ?Height (in): 64 ?Pulse (bpm): 68 ?Weight (lbs): 135 ?Respiratory Rate (breaths/min): 16 ?Body Mass Index (BMI): 23.2 ?Blood Pressure (mmHg): 134/56 ?Capillary Blood Glucose (mg/dl): 188 ?Reference Range: 80 - 120 mg / dl ?Electronic Signature(s) ?Signed: 11/22/2021 4:32:17 PM By: [name] CHT EMT BS ?, , ?Entered By: [name] on 11/22/2021 16:32:17 ?

## 2021-11-22 NOTE — Progress Notes (Signed)
[name], [name] ([id number]) Visit Report for 11/21/2021 Arrival Information Details Patient Name: Date of Service: [name] 11/21/2021 1:00 PM Medical Record Number: [id number] Patient Account Number: [id number] Date of Birth/Sex: Treating RN: 03/29/1941 (81 y.o. [name], [name] Primary Care Verita Kuroda: [name] Other Clinician: [name] Referring Leman Martinek: Treating Cassanda Walmer/[name]: Jimmy Footman in Treatment: 65 Visit Information History Since Last Visit All ordered tests and consults were completed: Yes Patient Arrived: Ambulatory Added or deleted any medications: No Arrival Time: 12:37 Any new allergies or adverse reactions: No Accompanied By: self Had a fall or experienced change in No Transfer Assistance: None activities of daily living that may affect Patient Identification Verified: Yes risk of falls: Secondary Verification Process Completed: Yes Signs or symptoms of abuse/neglect since last visito No Patient Requires Transmission-Based Precautions: No Hospitalized since last visit: No Patient Has Alerts: Yes Implantable device outside of the clinic excluding No Patient Alerts: Patient on Blood Thinner cellular tissue based products placed in the center L ABI: 0.82 since last visit: R ABI: 0.47 Pain Present Now: No Electronic Signature(s) Signed: 11/22/2021 11:45:39 AM By: [name] CHT EMT BS , , Entered By: [name] on 11/21/2021 14:04:32 -------------------------------------------------------------------------------- Encounter Discharge Information Details Patient Name: Date of Service: [name], [name] 11/21/2021 1:00 PM Medical Record Number: [id number] Patient Account Number: [id number] Date of Birth/Sex: Treating RN: [birth year] (81 y.o. [name] Primary Care Yuvonne Lanahan: [name] Other Clinician: Referring Maveryck Bahri: Treating Maribel Luis/[name]: Jimmy Footman in Treatment: 22 Encounter Discharge Information Items Discharge Condition: Stable Ambulatory Status: Ambulatory Discharge Destination: Home Transportation: Private Auto Accompanied By: self Schedule Follow-up Appointment: No Clinical Summary of Care: Electronic Signature(s) Signed: 11/21/2021 4:26:20 PM By: [name] CHT EMT BS , , Entered By: [name] on 11/21/2021 16:26:20 -------------------------------------------------------------------------------- Vitals Details Patient Name: Date of Service: [name], [name] 11/21/2021 1:00 PM Medical Record Number: [id number] Patient Account Number: [id number] Date of Birth/Sex: Treating RN: Jul 20, 1941 (81 y.o. [name] Primary Care Eriel Doyon: [name] Other Clinician: [name] Referring Tenlee Wollin: Treating Kahron Kauth/[name]: Jimmy Footman in Treatment: 22 Vital Signs Time Taken: 12:49 Temperature (F): 98.4 Height (in): 64 Pulse (bpm): 77 Weight (lbs): 135 Respiratory Rate (breaths/min): 16 Body Mass Index (BMI): 23.2 Blood Pressure (mmHg): 120/54 Capillary Blood Glucose (mg/dl): 181 Reference Range: 80 - 120 mg / dl Airway Pulse Oximetry (%): 99 Electronic Signature(s) Signed: 11/22/2021 11:45:39 AM By: [name] CHT EMT BS , , Entered By: [name] on 11/21/2021 14:05:10

## 2021-11-22 NOTE — Progress Notes (Addendum)
[name], [name] ([id number]) Visit Report for 11/21/2021 HBO Details Patient Name: Date of Service: [name] 11/21/2021 1:00 PM Medical Record Number: [id number] Patient Account Number: [id number] Date of Birth/Sex: Treating RN: 1940-10-13 (81 y.o. 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 HBO Treatment Details Treatment Number: 10 Patient Type: Outpatient Chamber Type: Monoplace Chamber Serial #: [id number] Treatment Protocol: 2.0 ATA with 90 minutes oxygen, and no air breaks Treatment Details Compression Rate Down: 2.0 psi / minute De-Compression Rate Up: 2.0 psi / minute Air breaks and breathing Decompress Decompress Compress Tx Pressure Begins Reached periods Begins Ends (leave unused spaces blank) Chamber Pressure (ATA 1 2 ------2 1 ) Clock Time (24 hr) 13:00 13:10 - - - - - - 14:40 14:50 Treatment Length: 110 (minutes) Treatment Segments: 4 Vital Signs Capillary Blood Glucose Reference Range: 80 - 120 mg / dl HBO Diabetic Blood Glucose Intervention Range: <131 mg/dl or >249 mg/dl Time Vitals Blood Respiratory Capillary Blood Glucose Pulse Action Type: Pulse: Temperature: Taken: Pressure: Rate: Glucose (mg/dl): Meter #: Oximetry (%) Taken: Pre 12:49 120/54 77 16 98.4 181 2 99 manual BP Post 14:53 162/62 76 16 98.5 167 2 100 manual BP Treatment Response Treatment Toleration: Well Treatment Completion Status: Treatment Completed without Adverse  Event Additional Procedure Documentation 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 A trained emergency response team is readily available per Yes hospital policies and procedures. Continue HBOT as ordered. Yes Electronic Signature(s) Signed: 11/23/2021 1:35:26 PM By: [name] Shan DO Signed: 11/24/2021 1:01:28 PM By: [name] CHT EMT BS , , Signed: 11/24/2021 1:01:28 PM By: [name] CHT EMT BS , , Previous Signature: 11/21/2021 4:25:25 PM Version By: [name] CHT EMT BS , , Previous Signature: 11/22/2021 2:56:46 PM Version By: [name] Shan DO Previous Signature: 11/21/2021 3:53:03 PM Version By: [name] Shan DO Entered By: [name] on 11/23/2021 13:17:26 -------------------------------------------------------------------------------- HBO Safety Checklist Details Patient Name: Date of Service: [name] 11/21/2021 1:00 PM Medical Record Number: [id number] Patient Account Number: [id number] Date of Birth/Sex: Treating RN: Mar 27, 1941 (81 y.o. [name], [name] Primary Care [name]: [name] Other Clinician: Valeria [name] Referring [name]: Treating [name]/[name]: [name] in Treatment: 22 HBO Safety Checklist Items Safety Checklist Consent Form Signed Patient voided / foley secured and emptied When did you last eato 1215 Last dose of injectable or oral agent 0830 Ostomy pouch emptied and vented if applicable All implantable devices assessed, documented and approved NA Intravenous access site secured and place Right Forearm PICC Valuables secured NA Linens and cotton and cotton/polyester blend (less than 51% polyester) Personal oil-based products / skin lotions / body lotions removed Wigs or hairpieces removed Patient removed item. Smoking or tobacco materials  removed NA Books / newspapers / magazines / loose paper removed Cologne, aftershave, perfume and deodorant removed Jewelry removed (may wrap wedding band) Make-up removed Hair care products removed Battery operated devices (external) removed Heating patches and chemical warmers removed Titanium eyewear removed Nail polish cured greater than 10  hours Casting material cured greater than 10 hours NA Hearing aids removed NA Loose dentures or partials removed NA Prosthetics have been removed NA Patient demonstrates correct use of air break device (if applicable) Patient concerns have been addressed Patient grounding bracelet on and cord attached to chamber Specifics for Inpatients (complete in addition to above) Medication sheet sent with patient NA Intravenous medications needed or due during therapy sent with patient NA Drainage tubes (e.g. nasogastric tube or chest tube secured and vented) NA Endotracheal or Tracheotomy tube secured NA Cuff deflated of air and inflated with saline NA Airway suctioned NA Notes Paper version used prior to treatment. Electronic Signature(s) Signed: 11/22/2021 11:45:39 AM By: [name] CHT EMT BS , , Entered By: [name] on 11/21/2021 14:11:17

## 2021-11-23 ENCOUNTER — Encounter (HOSPITAL_BASED_OUTPATIENT_CLINIC_OR_DEPARTMENT_OTHER): Payer: Medicare Other | Admitting: General Surgery

## 2021-11-23 DIAGNOSIS — M869 Osteomyelitis, unspecified: Secondary | ICD-10-CM | POA: Diagnosis not present

## 2021-11-23 DIAGNOSIS — I10 Essential (primary) hypertension: Secondary | ICD-10-CM | POA: Diagnosis not present

## 2021-11-23 DIAGNOSIS — T8131XD Disruption of external operation (surgical) wound, not elsewhere classified, subsequent encounter: Secondary | ICD-10-CM | POA: Diagnosis not present

## 2021-11-23 DIAGNOSIS — Z89412 Acquired absence of left great toe: Secondary | ICD-10-CM | POA: Diagnosis not present

## 2021-11-23 DIAGNOSIS — E11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: Secondary | ICD-10-CM | POA: Diagnosis not present

## 2021-11-23 DIAGNOSIS — I70202 Unspecified atherosclerosis of native arteries of extremities, left leg: Secondary | ICD-10-CM | POA: Diagnosis not present

## 2021-11-23 DIAGNOSIS — E11621 Type 2 diabetes mellitus with foot ulcer: Secondary | ICD-10-CM | POA: Diagnosis not present

## 2021-11-23 DIAGNOSIS — Z4781 Encounter for orthopedic aftercare following surgical amputation: Secondary | ICD-10-CM | POA: Diagnosis not present

## 2021-11-23 DIAGNOSIS — L97514 Non-pressure chronic ulcer of other part of right foot with necrosis of bone: Secondary | ICD-10-CM | POA: Diagnosis not present

## 2021-11-23 DIAGNOSIS — E11628 Type 2 diabetes mellitus with other skin complications: Secondary | ICD-10-CM | POA: Diagnosis not present

## 2021-11-23 DIAGNOSIS — D638 Anemia in other chronic diseases classified elsewhere: Secondary | ICD-10-CM | POA: Diagnosis not present

## 2021-11-23 DIAGNOSIS — L97512 Non-pressure chronic ulcer of other part of right foot with fat layer exposed: Secondary | ICD-10-CM | POA: Diagnosis not present

## 2021-11-23 DIAGNOSIS — M86671 Other chronic osteomyelitis, right ankle and foot: Secondary | ICD-10-CM | POA: Diagnosis not present

## 2021-11-23 LAB — GLUCOSE, CAPILLARY
Glucose-Capillary: 188 mg/dL — ABNORMAL HIGH (ref 70–99)
Glucose-Capillary: 137 mg/dL — ABNORMAL HIGH (ref 70–99)

## 2021-11-23 NOTE — Progress Notes (Signed)
[name], [name] ([id number]) Visit Report for 11/22/2021 Arrival Information Details Patient Name: Date of Service: [name] 11/22/2021 3:00 PM Medical Record Number: [id number] Patient Account Number: [id number] Date of Birth/Sex: Treating RN: 1940-10-10 (81 y.o. Donalda Ewings Primary Care Roman Dubuc: [name] Other Clinician: Referring Ventura Leggitt: Treating Karelly Dewalt/[name]: Mardi Mainland in Treatment: 67 Visit Information History Since Last Visit Added or deleted any medications: No Patient Arrived: Ambulatory Any new allergies or adverse reactions: No Arrival Time: 15:03 Had a fall or experienced change in No Transfer Assistance: None activities of daily living that may affect Patient Identification Verified: Yes risk of falls: Secondary Verification Process Completed: Yes Signs or symptoms of abuse/neglect since last visito No Patient Requires Transmission-Based Precautions: No Hospitalized since last visit: No Patient Has Alerts: Yes Has Dressing in Place as Prescribed: Yes Patient Alerts: Patient on Blood Thinner Pain Present Now: No L ABI: 0.82 R ABI: 0.47 Electronic Signature(s) Signed: 11/23/2021 5:02:38 PM By: Sharyn Creamer RN, BSN Entered By: Sharyn Creamer on 11/22/2021 15:08:01 -------------------------------------------------------------------------------- Encounter Discharge Information Details Patient Name: Date of Service: [name], [name] LLY H. 11/22/2021 3:00 PM Medical Record Number: [id number] Patient Account Number: [id number] Date of Birth/Sex: Treating RN: 1940-11-22 (81 y.o. [name] Primary Care Darian Cansler: [name] Other Clinician: Referring Esmirna Ravan: Treating Luisana Lutzke/[name]: Mardi Mainland in Treatment: 22 Encounter Discharge Information Items Post Procedure Vitals Discharge Condition: Stable Temperature (F): 98.2 Ambulatory Status: Cane Pulse (bpm):  71 Discharge Destination: Home Respiratory Rate (breaths/min): 18 Transportation: Private Auto Blood Pressure (mmHg): 138/60 Accompanied By: alone Schedule Follow-up Appointment: Yes Clinical Summary of Care: Patient Declined Electronic Signature(s) Signed: 11/22/2021 5:58:06 PM By: [name] RN, BSN Entered By: [name] on 11/22/2021 17:26:51 -------------------------------------------------------------------------------- Lower Extremity Assessment Details Patient Name: Date of Service: [name], [name] LLY H. 11/22/2021 3:00 PM Medical Record Number: [id number] Patient Account Number: [id number] Date of Birth/Sex: Treating RN: May 26, 1941 (81 y.o. Donalda Ewings Primary Care Deborahann Poteat: [name] Other Clinician: Referring Emeli Goguen: Treating Zyquan Crotty/[name]: Mardi Mainland in Treatment: 22 Edema Assessment Assessed: Shirlyn Goltz: No] [Right: No] Edema: [Left: No] [Right: Yes] Calf Left: Right: Point of Measurement: From Medial Instep 30 cm 31.5 cm Ankle Left: Right: Point of Measurement: From Medial Instep 19.4 cm 19.5 cm Vascular Assessment Pulses: Dorsalis Pedis Palpable: [Left:Yes] [Right:Yes] Electronic Signature(s) Signed: 11/23/2021 5:02:38 PM By: Sharyn Creamer RN, BSN Entered By: Sharyn Creamer on 11/22/2021 15:15:57 -------------------------------------------------------------------------------- Multi Wound Chart Details Patient Name: Date of Service: [name], [name] LLY H. 11/22/2021 3:00 PM Medical Record Number: [id number] Patient Account Number: [id number] Date of Birth/Sex: Treating RN: 12/25/1940 (81 y.o. F) Primary Care Shaylea Ucci: [name] Other Clinician: Referring Kyden Potash: Treating Rhet Rorke/[name]: Mardi Mainland in Treatment: 22 Vital Signs Height(in): 64 Capillary Blood Glucose(mg/dl): 137 Weight(lbs): 135 Pulse(bpm): 59 Body Mass Index(BMI): 23.2 Blood Pressure(mmHg):  138/60 Temperature(F): 98.2 Respiratory Rate(breaths/min): 18 Photos: [N/A:N/A] Right Amputation Site - Right, Distal Amputation Site - N/A Wound Location: Transmetatarsal Transmetatarsal Other Lesion Gradually Appeared N/A Wounding Event: Diabetic Wound/Ulcer of the Lower T be determined o N/A Primary Etiology: Extremity Coronary Artery Disease, Deep Vein Coronary Artery Disease, Deep Vein N/A Comorbid History: Thrombosis, Hypertension, Myocardial Thrombosis, Hypertension, Myocardial Infarction, Peripheral Arterial Disease, Infarction, Peripheral Arterial Disease, Type II Diabetes Type II Diabetes 09/15/2021 11/15/2021 N/A Date Acquired: 9 1 N/A Weeks of Treatment: Open Healed - Epithelialized N/A Wound Status: No No N/A Wound Recurrence: 0.4x0.8x0.1 0x0x0 N/A Measurements L x W  x D (cm) 0.251 0 N/A A (cm) : rea 0.025 0 N/A Volume (cm) : 36.10% 100.00% N/A % Reduction in A rea: 78.80% 100.00% N/A % Reduction in Volume: Grade 3 Full Thickness Without Exposed N/A Classification: Support Structures Medium None Present N/A Exudate A mount: Serosanguineous N/A N/A Exudate Type: red, brown N/A N/A Exudate Color: Distinct, outline attached N/A N/A Wound Margin: Small (1-33%) None Present (0%) N/A Granulation A mount: Pale N/A N/A Granulation Quality: Large (67-100%) None Present (0%) N/A Necrotic A mount: Fat Layer (Subcutaneous Tissue): Yes Fascia: No N/A Exposed Structures: Fascia: No Fat Layer (Subcutaneous Tissue): No Tendon: No Tendon: No Muscle: No Muscle: No Joint: No Joint: No Bone: No Bone: No Large (67-100%) Large (67-100%) N/A Epithelialization: Debridement - Selective/Open Wound N/A N/A Debridement: Pre-procedure Verification/Time Out 15:43 N/A N/A Taken: Callus N/A N/A Tissue Debrided: Skin/Epidermis N/A N/A Level: 0.32 N/A N/A Debridement A (sq cm): rea Curette N/A N/A Instrument: None N/A N/A Bleeding: 0 N/A  N/A Procedural Pain: 0 N/A N/A Post Procedural Pain: Procedure was tolerated well N/A N/A Debridement Treatment Response: 0.4x0.8x0.1 N/A N/A Post Debridement Measurements L x W x D (cm) 0.025 N/A N/A Post Debridement Volume: (cm) Debridement N/A N/A Procedures Performed: Treatment Notes Electronic Signature(s) Signed: 11/22/2021 3:49:11 PM By: [name] MD FACS Entered By: [name] on 11/22/2021 15:49:11 -------------------------------------------------------------------------------- Multi-Disciplinary Care Plan Details Patient Name: Date of Service: [name], [name] LLY H. 11/22/2021 3:00 PM Medical Record Number: [id number] Patient Account Number: [id number] Date of Birth/Sex: Treating RN: 09-02-1941 (81 y.o. [name] Primary Care Maie Kesinger: [name] Other Clinician: Referring Merland Holness: Treating Rogue Pautler/[name]: Mardi Mainland in Treatment: Harrells reviewed with physician Active Inactive Nutrition Nursing Diagnoses: Impaired glucose control: actual or potential Potential for alteratiion in Nutrition/Potential for imbalanced nutrition Goals: Patient/caregiver agrees to and verbalizes understanding of need to use nutritional supplements and/or vitamins as prescribed Date Initiated: 06/15/2021 Date Inactivated: 09/20/2021 Target Resolution Date: 09/23/2021 Goal Status: Met Patient/caregiver will maintain therapeutic glucose control Date Initiated: 06/15/2021 Target Resolution Date: 12/18/2021 Goal Status: Active Interventions: Assess HgA1c results as ordered upon admission and as needed Assess patient nutrition upon admission and as needed per policy Provide education on elevated blood sugars and impact on wound healing Provide education on nutrition Treatment Activities: Education provided on Nutrition : 06/15/2021 Notes: 09/20/21: Glucose control ongoing. Wound/Skin Impairment Nursing  Diagnoses: Impaired tissue integrity Knowledge deficit related to ulceration/compromised skin integrity Goals: Patient/caregiver will verbalize understanding of skin care regimen Date Initiated: 06/15/2021 Target Resolution Date: 12/18/2021 Goal Status: Active Interventions: Assess patient/caregiver ability to obtain necessary supplies Assess patient/caregiver ability to perform ulcer/skin care regimen upon admission and as needed Assess ulceration(s) every visit Provide education on ulcer and skin care Notes: 09/20/21: Wound care regimen continues Electronic Signature(s) Signed: 11/22/2021 5:58:06 PM By: [name] RN, BSN Entered By: [name] on 11/22/2021 17:24:49 -------------------------------------------------------------------------------- Pain Assessment Details Patient Name: Date of Service: [name], [name] LLY H. 11/22/2021 3:00 PM Medical Record Number: [id number] Patient Account Number: [id number] Date of Birth/Sex: Treating RN: 03/12/1941 (81 y.o. Donalda Ewings Primary Care Loreley Schwall: [name] Other Clinician: Referring Dorota Heinrichs: Treating Denice Cardon/[name]: Mardi Mainland in Treatment: 22 Active Problems Location of Pain Severity and Description of Pain Patient Has Paino No Site Locations Pain Management and Medication Current Pain Management: Electronic Signature(s) Signed: 11/23/2021 5:02:38 PM By: Sharyn Creamer RN, BSN Entered By: Sharyn Creamer on 11/22/2021 15:08:48 -------------------------------------------------------------------------------- Patient/Caregiver Education Details Patient Name:  Date of Service: [name] 3/1/2023andnbsp3:00 PM Medical Record Number: [id number] Patient Account Number: [id number] Date of Birth/Gender: Treating RN: [birth year] (81 y.o. [name] Primary Care Physician: [name] Other Clinician: Referring Physician: Treating Physician/[name]: Mardi Mainland in Treatment: 22 Education Assessment Education Provided To: Patient Education Topics Provided Wound/Skin Impairment: Methods: Explain/Verbal Responses: State content correctly [employer]) Signed: 11/22/2021 5:58:06 PM By: [name] RN, BSN Entered By: [name] on 11/22/2021 17:25:07 -------------------------------------------------------------------------------- Wound Assessment Details Patient Name: Date of Service: [name], [name] LLY H. 11/22/2021 3:00 PM Medical Record Number: [id number] Patient Account Number: [id number] Date of Birth/Sex: Treating RN: 29-Oct-1940 (81 y.o. Donalda Ewings Primary Care [name] Petteway: [name] Other Clinician: Referring Iren Whipp: Treating Natavia Sublette/[name]: Mardi Mainland in Treatment: 22 Wound Status Wound Number: 15 Primary Diabetic Wound/Ulcer of the Lower Extremity Etiology: Wound Location: Right Amputation Site - Transmetatarsal Wound Open Wounding Event: Other Lesion Status: Date Acquired: 09/15/2021 Comorbid Coronary Artery Disease, Deep Vein Thrombosis, Hypertension, Weeks Of Treatment: 9 History: Myocardial Infarction, Peripheral Arterial Disease, Type II Diabetes Clustered Wound: No Photos Wound Measurements Length: (cm) 0.4 Width: (cm) 0.8 Depth: (cm) 0.1 Area: (cm) 0.251 Volume: (cm) 0.025 % Reduction in Area: 36.1% % Reduction in Volume: 78.8% Epithelialization: Large (67-100%) Tunneling: No Undermining: No Wound Description Classification: Grade 3 Wound Margin: Distinct, outline attached Exudate Amount: Medium Exudate Type: Serosanguineous Exudate Color: red, brown Foul Odor After Cleansing: No Slough/Fibrino Yes Wound Bed Granulation Amount: Small (1-33%) Exposed Structure Granulation Quality: Pale Fascia Exposed: No Necrotic Amount: Large (67-100%) Fat Layer (Subcutaneous Tissue) Exposed: Yes Necrotic Quality:  Adherent Slough Tendon Exposed: No Muscle Exposed: No Joint Exposed: No Bone Exposed: No Treatment Notes 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 Wound Cleanser Discharge Instruction: Cleanse the wound with wound cleanser prior to applying a clean dressing using gauze sponges, not tissue or cotton balls. Peri-Wound Care Topical Primary Dressing Promogran Prisma Matrix, 4.34 (sq in) (silver collagen) Discharge Instruction: Moisten collagen with [place] Jelly Secondary Dressing Woven Gauze Sponges 2x2 in Discharge Instruction: Apply over primary dressing as directed. Secured With [employer], 4.5x3.1 (in/yd) Discharge Instruction: Secure with Kerlix as directed. Transpore Surgical Tape, 2x10 (in/yd) Discharge Instruction: Secure dressing with tape as directed. Compression Wrap Compression Stockings Add-Ons Electronic Signature(s) Signed: 11/23/2021 5:02:38 PM By: Sharyn Creamer RN, BSN Entered By: Sharyn Creamer on 11/22/2021 15:21:16 -------------------------------------------------------------------------------- Wound Assessment Details Patient Name: Date of Service: [name], [name] LLY H. 11/22/2021 3:00 PM Medical Record Number: [id number] Patient Account Number: [id number] Date of Birth/Sex: Treating RN: December 31, 1940 (81 y.o. Donalda Ewings Primary Care Roylee Chaffin: [name] Other Clinician: Referring Keondria Siever: Treating Latona Krichbaum/[name]: Mardi Mainland in Treatment: 22 Wound Status Wound Number: 19 Primary T be determined o Etiology: Wound Location: Right, Distal Amputation Site - Transmetatarsal Wound Healed - Epithelialized Wounding Event: Gradually Appeared Status: Date Acquired: 11/15/2021 Comorbid Coronary Artery Disease, Deep Vein Thrombosis,  Hypertension, Weeks Of Treatment: 1 History: Myocardial Infarction, Peripheral Arterial Disease, Type II Diabetes Clustered Wound: No Photos Wound Measurements Length: (cm) Width: (cm) Depth: (cm) Area: (cm) Volume: (cm) 0 % Reduction in Area: 100% 0 % Reduction in Volume: 100% 0 Epithelialization: Large (67-100%) 0 Tunneling: No 0 Undermining: No Wound Description Classification: Full Thickness Without Exposed Support Structures Exudate Amount: None Present Foul Odor After Cleansing:  No Slough/Fibrino No Wound Bed Granulation Amount: None Present (0%) Exposed Structure Necrotic Amount: None Present (0%) Fascia Exposed: No Fat Layer (Subcutaneous Tissue) Exposed: No Tendon Exposed: No Muscle Exposed: No Joint Exposed: No Bone Exposed: No Electronic Signature(s) Signed: 11/22/2021 5:58:06 PM By: [name] RN, BSN Signed: 11/23/2021 5:02:38 PM By: Sharyn Creamer RN, BSN Entered By: [name] on 11/22/2021 15:43:50 -------------------------------------------------------------------------------- Vitals Details Patient Name: Date of Service: [name], [name] LLY H. 11/22/2021 3:00 PM Medical Record Number: [id number] Patient Account Number: [id number] Date of Birth/Sex: Treating RN: [birth year] (81 y.o. Donalda Ewings Primary Care Innocence Schlotzhauer: [name] Other Clinician: Referring Maicee Ullman: Treating Nishka Heide/[name]: Mardi Mainland in Treatment: 22 Vital Signs Time Taken: 15:08 Temperature (F): 98.2 Height (in): 64 Pulse (bpm): 71 Weight (lbs): 135 Respiratory Rate (breaths/min): 18 Body Mass Index (BMI): 23.2 Blood Pressure (mmHg): 138/60 Capillary Blood Glucose (mg/dl): 137 Reference Range: 80 - 120 mg / dl Electronic Signature(s) Signed: 11/23/2021 5:02:38 PM By: Sharyn Creamer RN, BSN Entered By: Sharyn Creamer on 11/22/2021 15:08:39

## 2021-11-23 NOTE — Progress Notes (Signed)
[name], [name] ([id number]) ?Visit Report for 11/23/2021 ?SuperBill Details ?Patient Name: Date of Service: ?[name], [name] 11/23/2021 ?Medical Record Number: [id number] ?Patient Account Number: [id number] ?Date of Birth/Sex: Treating RN: ?July 25, 1941 (81 y.o. [name], [name] ?Primary Care Provider: [name] ?Other Clinician: [name] ?Referring Provider: ?Treating Provider/[name]: [name] ?[name] ?Weeks in Treatment: 23 ?Diagnosis Coding ?ICD-10 Codes ?Code Description ?E11.621 Type 2 diabetes mellitus with foot ulcer ?T81.31XD Disruption of external operation (surgical) wound, not elsewhere classified, subsequent encounter ?L97.514 Non-pressure chronic ulcer of other part of right foot with necrosis of bone ?E11.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 ?T02.409 Other chronic osteomyelitis, right ankle and foot ?Facility Procedures ?CPT4 Code Description Modifier Quantity ?[id number] G0277-(Facility Use Only) HBOT full body chamber, 79min ?, 4 ?ICD-10 Diagnosis Description ?E11.621 Type 2 diabetes mellitus with foot ulcer ?L97.514 Non-pressure chronic ulcer of other part of right foot with necrosis of bone ?T81.31XD Disruption of external operation (surgical) wound, not elsewhere classified, subsequent encounter ?E26.834 Other chronic osteomyelitis, right ankle and foot ?Physician Procedures ?Quantity ?CPT4 Code Description Modifier ?[id number] - WC PHYS HYPERBARIC OXYGEN THERAPY 1 ?ICD-10 Diagnosis Description ?E11.621 Type 2 diabetes mellitus with foot ulcer ?L97.514 Non-pressure chronic ulcer of other part of right foot with necrosis of bone ?T81.31XD Disruption of external operation (surgical) wound, not elsewhere classified, subsequent encounter ?J19.417 Other chronic osteomyelitis, right ankle and foot ?Electronic Signature(s) ?Signed: 11/23/2021 4:19:57 PM By: [name] MD FACS ?Previous Signature: 11/23/2021 4:00:22 PM  Version By: [name] CHT EMT BS ?, , ?Entered By: [name] on 11/23/2021 16:19:56 ?

## 2021-11-24 ENCOUNTER — Encounter (HOSPITAL_BASED_OUTPATIENT_CLINIC_OR_DEPARTMENT_OTHER): Payer: Medicare Other | Admitting: General Surgery

## 2021-11-24 NOTE — Progress Notes (Signed)
[name], EGNER ([id number]) ?Visit Report for 11/23/2021 ?HBO Details ?Patient Name: Date of Service: ?[name], [name] 11/23/2021 1:00 PM ?Medical Record Number: [id number] ?Patient Account Number: [id number] ?Date of Birth/Sex: Treating RN: ?August 01, 1941 (81 y.o. [name], [name] ?Primary Care Rebekka Lobello: [name] ?Other Clinician: [name] ?Referring Demonte Dobratz: ?Treating Arnika Larzelere/[name]: [name] ?[name] ?Weeks in Treatment: 23 ?HBO Treatment Course Details ?Treatment Course Number: 2 ?Ordering Gwendolyn Mclees: [name] ?T Treatments Ordered: ?otal 30 HBO Treatment Start Date: 11/08/2021 ?HBO Indication: ?Diabetic Ulcer(s) of the Lower Extremity ?Standard/Conservative Wound Care tried and failed greater than or equal to ?30 days ?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 ?HBO Treatment Details ?Treatment Number: 12 ?Patient Type: Outpatient ?Chamber Type: Monoplace ?Chamber Serial #: [id number] ?Treatment Protocol: 2.0 ATA with 90 minutes oxygen, and no air breaks ?Treatment Details ?Compression Rate Down: 2.0 psi / minute ?De-Compression Rate Up: 2.0 psi / minute ?Air ?breaks ?and ?breathing Decompress Decompress ?Compress Tx Pressure ?Begins Reached periods Begins Ends ?(leave ?unused ?spaces ?blank) ?Chamber Pressure (ATA 1 2 ------2 1 ?) ?Clock Time (24 hr) 13:06 13:14 - - - - - - 14:44 14:54 ?Treatment Length: 108 (minutes) ?Treatment Segments: 4 ?Vital Signs ?Capillary Blood Glucose Reference Range: 80 - 120 mg / dl ?HBO Diabetic Blood Glucose Intervention Range: <131 mg/dl or >249 mg/dl ?Time Vitals Blood Respiratory Capillary Blood Glucose Pulse Action ?Type: ?Pulse: Temperature: ?Taken: ?Pressure: ?Rate: ?Glucose (mg/dl): ?Meter #: Oximetry (%) Taken: ?Pre 13:00 124/60 63 14 98.3 188 100 ?Post 14:56 138/72 72 14 98.1 137 2 100 ?Treatment Response ?Treatment Toleration: Well ?Treatment Completion Status: Treatment Completed without Adverse Event ?Additional Procedure  Documentation ?Tissue Sevierity: Fat layer exposed ?Physician HBO Attestation: ?I certify that I supervised this HBO treatment in accordance with Medicare ?guidelines. A trained emergency response team is readily available per Yes ?hospital policies and procedures. ?Continue HBOT as ordered. Yes ?Electronic Signature(s) ?Signed: 11/23/2021 4:19:41 PM By: [name] MD FACS ?Previous Signature: 11/23/2021 3:59:02 PM Version By: [name] CHT EMT BS ?, , ?Previous Signature: 11/23/2021 3:27:59 PM Version By: [name] MD FACS ?Previous Signature: 11/23/2021 3:27:59 PM Version By: [name] MD FACS ?Entered By: [name] on 11/23/2021 16:19:41 ?-------------------------------------------------------------------------------- ?HBO Safety Checklist Details ?Patient Name: ?Date of Service: ?[name], [name] 11/23/2021 1:00 PM ?Medical Record Number: [id number] ?Patient Account Number: [id number] ?Date of Birth/Sex: ?Treating RN: ?[birth year] (81 y.o. [name], [name] ?Primary Care Lashena Signer: [name] ?Other Clinician: [name] ?Referring Kurstin Dimarzo: ?Treating Glenroy Crossen/[name]: [name] ?[name] ?Weeks in Treatment: 23 ?HBO Safety Checklist Items ?Safety Checklist ?Consent Form Signed ?Patient voided / foley secured and emptied ?When did you last eato 1210 ?Last dose of injectable or oral agent 0900 ?Ostomy pouch emptied and vented if applicable ?NA ?All implantable devices assessed, documented and approved ?NA ?Intravenous access site secured and place Right Forearm PICC ?Valuables secured ?Linens and cotton and cotton/polyester blend (less than 51% polyester) ?Personal oil-based products / skin lotions / body lotions removed ?Wigs or hairpieces removed Patient removed item. ?Smoking or tobacco materials removed ?NA ?Books / newspapers / magazines / loose paper removed ?Cologne, aftershave, perfume and deodorant removed ?Jewelry removed (may wrap wedding  band) ?Make-up removed ?Hair care products removed ?Battery operated devices (external) removed ?Heating patches and chemical warmers removed ?Titanium eyewear removed ?Nail polish cured greater than 10 hours ?Casting material cured greater than 10 hours ?NA ?Hearing aids removed ?NA ?Loose dentures or partials removed ?NA ?Prosthetics have been removed ?NA ?Patient demonstrates  correct use of air break device (if applicable) ?Patient concerns have been addressed ?Patient grounding bracelet on and cord attached to chamber ?Specifics for Inpatients (complete in addition to above) ?Medication sheet sent with patient ?NA ?Intravenous medications needed or due during therapy sent with patient ?NA ?Drainage tubes (e.g. nasogastric tube or chest tube secured and vented) ?NA ?Endotracheal or Tracheotomy tube secured ?NA ?Cuff deflated of air and inflated with saline ?NA ?Airway suctioned ?NA ?Notes ?Paper version used prior to treatment. ?Electronic Signature(s) ?Signed: 11/24/2021 1:01:28 PM By: [name] CHT EMT BS ?, , ?Entered By: [name] on 11/23/2021 13:29:33 ?

## 2021-11-24 NOTE — Progress Notes (Signed)
[name], [name] ([id number]) ?Visit Report for 11/23/2021 ?Arrival Information Details ?Patient Name: Date of Service: ?[name], [name] 11/23/2021 1:00 PM ?Medical Record Number: [id number] ?Patient Account Number: [id number] ?Date of Birth/Sex: Treating RN: ?November 29, 1940 (81 y.o. [name], [name] ?Primary Care [name]: [name] ?Other Clinician: Donavan [name] ?Referring [name]: ?Treating [name]: [name] ?[name] ?Weeks in Treatment: 23 ?Visit Information History Since Last Visit ?All ordered tests and consults were completed: Yes ?Patient Arrived: Ambulatory ?Added or deleted any medications: No ?Arrival Time: 12:39 ?Any new allergies or adverse reactions: No ?Accompanied By: self ?Had a fall or experienced change in No ?Transfer Assistance: None ?activities of daily living that may affect ?Patient Identification Verified: Yes ?risk of falls: ?Secondary Verification Process Completed: Yes ?Signs or symptoms of abuse/neglect since last visito No ?Patient Requires Transmission-Based Precautions: No ?Hospitalized since last visit: No ?Patient Has Alerts: Yes ?Implantable device outside of the clinic excluding No ?Patient Alerts: Patient on Blood Thinner cellular tissue based products placed in the center ?L ABI: 0.82 since last visit: ?R ABI: 0.47 ?Pain Present Now: No ?Electronic Signature(s) ?Signed: 11/24/2021 1:01:28 PM By: [name] CHT EMT BS ?, , ?Entered By: [name] on 11/23/2021 13:20:51 ?-------------------------------------------------------------------------------- ?Vitals Details ?Patient Name: Date of Service: ?[name], [name] 11/23/2021 1:00 PM ?Medical Record Number: [id number] ?Patient Account Number: [id number] ?Date of Birth/Sex: Treating RN: ?1941/01/19 (81 y.o. [name], [name] ?Primary Care [name]: [name] ?Other Clinician: Donavan [name] ?Referring [name]: ?Treating Roseanna Koplin/[name]: [name] ?[name] ?Weeks in Treatment: 23 ?Vital Signs ?Time Taken: 13:00 ?Temperature (??F): 98.3 ?Height (in): 64 ?Pulse (bpm): 63 ?Weight (lbs): 135 ?Respiratory Rate (breaths/min): 14 ?Body Mass Index (BMI): 23.2 ?Blood Pressure (mmHg): 124/60 ?Capillary Blood Glucose (mg/dl): 188 ?Reference Range: 80 - 120 mg / dl ?Airway ?Pulse Oximetry (%): 100 ?Electronic Signature(s) ?Signed: 11/24/2021 1:01:28 PM By: [name] CHT EMT BS ?, , ?Entered By: [name] on 11/23/2021 13:28:16 ?

## 2021-11-27 ENCOUNTER — Encounter: Payer: Self-pay | Admitting: Internal Medicine

## 2021-11-27 ENCOUNTER — Other Ambulatory Visit: Payer: Self-pay

## 2021-11-27 ENCOUNTER — Ambulatory Visit (INDEPENDENT_AMBULATORY_CARE_PROVIDER_SITE_OTHER): Payer: Medicare Other | Admitting: Internal Medicine

## 2021-11-27 ENCOUNTER — Telehealth: Payer: Self-pay

## 2021-11-27 ENCOUNTER — Encounter (HOSPITAL_BASED_OUTPATIENT_CLINIC_OR_DEPARTMENT_OTHER): Payer: Medicare Other | Admitting: Internal Medicine

## 2021-11-27 VITALS — BP 183/74 | HR 86 | Temp 97.8°F | Ht 64.0 in | Wt 152.0 lb

## 2021-11-27 DIAGNOSIS — M86671 Other chronic osteomyelitis, right ankle and foot: Secondary | ICD-10-CM

## 2021-11-27 DIAGNOSIS — E1169 Type 2 diabetes mellitus with other specified complication: Secondary | ICD-10-CM

## 2021-11-27 DIAGNOSIS — L97509 Non-pressure chronic ulcer of other part of unspecified foot with unspecified severity: Secondary | ICD-10-CM

## 2021-11-27 DIAGNOSIS — I739 Peripheral vascular disease, unspecified: Secondary | ICD-10-CM

## 2021-11-27 DIAGNOSIS — E11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: Secondary | ICD-10-CM | POA: Diagnosis not present

## 2021-11-27 DIAGNOSIS — L97514 Non-pressure chronic ulcer of other part of right foot with necrosis of bone: Secondary | ICD-10-CM

## 2021-11-27 DIAGNOSIS — E11621 Type 2 diabetes mellitus with foot ulcer: Secondary | ICD-10-CM

## 2021-11-27 DIAGNOSIS — I6523 Occlusion and stenosis of bilateral carotid arteries: Secondary | ICD-10-CM

## 2021-11-27 DIAGNOSIS — M869 Osteomyelitis, unspecified: Secondary | ICD-10-CM | POA: Diagnosis not present

## 2021-11-27 DIAGNOSIS — T8131XD Disruption of external operation (surgical) wound, not elsewhere classified, subsequent encounter: Secondary | ICD-10-CM | POA: Diagnosis not present

## 2021-11-27 LAB — GLUCOSE, CAPILLARY
Glucose-Capillary: 172 mg/dL — ABNORMAL HIGH (ref 70–99)
Glucose-Capillary: 115 mg/dL — ABNORMAL HIGH (ref 70–99)

## 2021-11-27 MED ORDER — FLUCONAZOLE 150 MG PO TABS: 150.0000 mg | ORAL_TABLET | Freq: Every day | ORAL | 0 refills | Status: DC

## 2021-11-27 NOTE — Progress Notes (Unsigned)
RFV: follow up o DFU/osteomyelitis  Patient ID: [name], female   DOB: 01-Sep-1941, 81 y.o.   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 She has been on daptomycin plus meropenem for 6 wk with end date today. She also follows at wound care center where she has been getting hbo  [name] is a 81yo F with vascular disease, DM, hx of PsA DFU-osteomyelitis s/p right TMA. She is S/P Right external iliac, common femoral, profundofemoral, and superficial femoral endarterectomy with bovine pericardial patch angioplasty by Dr. Trula Slade for right toe gangrene.  Then TMA right foot by Dr. Earleen Newport DPM.  She went on the require Right   common femoral to above-knee popliteal artery bypass with 6 mm ringed PTFE.  Followed by Dr. Donzetta Matters 08/16/20. She has developed right TMA wound over medial aspect of previous TMA amputation and slightly on lateral aspect over 5th MT area. She follows with dr Dellia Nims, where she undergoes wound care.  Home health labs show crp 4 and esr of 2  On exam still slight redness to proximal region  Ends hbo on the 30th   Outpatient Encounter Medications as of 11/27/2021  Medication Sig   acetaminophen (TYLENOL) 500 MG tablet Take 1,000 mg by mouth every 6 (six) hours as needed for moderate pain or headache.   aspirin EC 81 MG tablet Take 81 mg by mouth at bedtime.    atorvastatin (LIPITOR) 80 MG tablet TAKE 1 TABLET BY MOUTH ONCE DAILY. PATIENT  MUST  KEEP  UPCOMING  APPOINTMENT  FOR  FUTURE  REFILLS   brimonidine (ALPHAGAN) 0.2 % ophthalmic solution Place 1 drop into the right eye 2 (two) times daily.   CINNAMON PO Take 2 tablets by mouth in the morning, at noon, and at bedtime.    empagliflozin (JARDIANCE) 25 MG TABS tablet Take 1 tablet (25 mg total) by mouth daily before breakfast.   FEROCON capsule Take 1 capsule by mouth 2 (two) times daily.   glimepiride (AMARYL) 2 MG tablet TAKE 1 TABLET BY MOUTH ONCE  DAILY BEFORE BREAKFAST   glucose blood (ONETOUCH VERIO) test strip Use to check blood sugars once daily   hydrochlorothiazide (HYDRODIURIL) 12.5 MG tablet Take 1 tablet by mouth once daily   Lancets (ONETOUCH DELICA PLUS [id number]) MISC Use to check blood sugars twice daily.   metFORMIN (GLUCOPHAGE) 1000 MG tablet Take 1 tablet by mouth twice daily (Patient taking differently: Take 1,000 mg by mouth in the morning.)   metoprolol tartrate (LOPRESSOR) 25 MG tablet TAKE 1 & 1/2 (ONE & ONE-HALF) TABLETS BY MOUTH TWICE DAILY   NON FORMULARY Prisma topical ointment for right foot   pantoprazole (PROTONIX) 40 MG tablet Take 1 tablet (40 mg total) by mouth daily. (Patient taking differently: Take 40 mg by mouth every Monday, Wednesday, and Friday.)   Probiotic Product ([employer] SENIOR PROBIOTIC PO) Take 1 capsule by mouth every evening.   Propylene Glycol (SYSTANE BALANCE) 0.6 % SOLN Place 1 drop into the left eye 3 (three) times daily as needed (dry/irritated eyes).   pyridoxine (B-6) 100 MG tablet Take 100 mg by mouth every evening.    repaglinide (PRANDIN) 1 MG tablet Take 1 tablet (1 mg total) by mouth daily before breakfast.   telmisartan (MICARDIS) 80 MG tablet Take 1 tablet by mouth once daily   XARELTO 2.5 MG TABS tablet Take 1 tablet by mouth twice daily   Facility-Administered Encounter Medications as  of 11/27/2021  Medication   sodium chloride flush (NS) 0.9 % injection 3 mL     Patient Active Problem List   Diagnosis Date Noted   S/P angiogram of extremity 10/13/2021   Osteomyelitis of foot, left, acute ([place]) 04/27/2021   Cellulitis in diabetic foot ([place]) 04/27/2021   Acute kidney injury superimposed on chronic kidney disease ([place]) 04/27/2021   Hyponatremia 04/27/2021   Anemia, chronic disease 04/27/2021   Cellulitis of left lower extremity    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 Pt states dynamap reads high, manual BP more accurate for her   Pulse 86    Temp 97.8 F (36.6 C) (Oral)    Ht _0  (1.626 m)    Wt 152 lb (68.9 kg)    SpO2 100%    BMI 26.09 kg/m    No results found for: CD4TCELL No results found for: CD4TABS No results found for: HIV1RNAQUANT No results found for: HEPBSAB No results found for: RPR, LABRPR  CBC Lab Results  Component Value Date   WBC 9.5 10/27/2021   RBC 3.48 (L) 10/27/2021   HGB 10.4 (L) 10/27/2021   HCT 33.1 (L) 10/27/2021   PLT 285 10/27/2021   MCV 95.1 10/27/2021   MCH 29.9 10/27/2021   MCHC 31.4 10/27/2021   RDW 15.9 (H) 10/27/2021   LYMPHSABS 2.0 10/27/2021   MONOABS 0.6 10/27/2021   EOSABS 0.5 10/27/2021    BMET Lab Results  Component Value Date   NA 137 10/27/2021   K 3.9 10/27/2021   CL 104 10/27/2021   CO2 26 10/27/2021   GLUCOSE 232 (H) 10/27/2021   BUN 16 10/27/2021   CREATININE 0.94 10/27/2021   CALCIUM 9.8  10/27/2021   GFRNONAA >60 10/27/2021   GFRAA 75 07/29/2020      Assessment and Plan  Appears improving. Would recommend to continue daptomycin for addn 2 wks stlil some erythema and wound slow to heal.  Will d/c meropenem - no recent GNR  See back in 2-3 wk  ------------ Opat order:  Diagnosis: ***  Culture Result: ***  Allergies  Allergen Reactions   Augmentin [Amoxicillin-Pot Clavulanate] Other (See Comments)    Elevates potassium level   Bactrim [Sulfamethoxazole-Trimethoprim] Other (See Comments)    ^ K+( elevated)    Demerol [Meperidine]     Delusional    Scopolamine  Delusional    Tramadol Other (See Comments)    Keeps awake   Versed [Midazolam] Anxiety    Frantic, out of my mind, agitated     OPAT Orders Discharge antibiotics to be given via PICC line Discharge antibiotics: Per pharmacy protocol daptomycin at 4m/kg/day  Duration: 2 wk End Date: 12/11/21  [facility]:  Home health RN for IV administration and teaching; PICC line care and labs.    Labs weekly while on IV antibiotics: _x_ CBC with differential __x BMP  _x_ CRP _x_ ESR  _x_ CK  _x_ Please pull PIC at completion of IV antibiotics   Fax weekly labs to [phone]  Clinic Follow Up Appt: 2-3 wk  @ [facility]

## 2021-11-27 NOTE — Telephone Encounter (Signed)
Spoke with Mirna Mires at [facility]. Confirmed receipt of updated OPAT orders. Per Dr. Baxter Flattery, discontinue meropenem today (11/27/21) and extend daptomycin with new end date of 12/11/21. ? ?Binnie Kand, RN  ?

## 2021-11-27 NOTE — Progress Notes (Signed)
[name], [name] ([id number]) ?Visit Report for 11/27/2021 ?Problem List Details ?Patient Name: Date of Service: ?[name], [name] 11/27/2021 1:00 PM ?Medical Record Number: [id number] ?Patient Account Number: [id number] ?Date of Birth/Sex: Treating RN: ?01/18/1941 (81 y.o. [name], [name] ?Primary Care Provider: [name] Other Clinician: ?Referring Provider: ?Treating Provider/[name]: [name] ?[name] ?Weeks in Treatment: 23 ?Active Problems ?ICD-10 ?Encounter ?Code Description Active Date MDM ?Diagnosis ?E11.621 Type 2 diabetes mellitus with foot ulcer 06/15/2021 No Yes ?T81.31XD Disruption of external operation (surgical) wound, not elsewhere classified, 06/15/2021 No Yes ?subsequent encounter ?L97.514 Non-pressure chronic ulcer of other part of right foot with necrosis of bone 09/20/2021 No Yes ?E11.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 06/15/2021 No Yes ?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, right ankle and foot 10/25/2021 No Yes ?Inactive Problems ?ICD-10 ?Code Description Active Date Inactive Date ?L97.428 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 06/15/2021 06/15/2021 ?L97.528 Non-pressure chronic ulcer of other part of left foot with other specified severity 06/15/2021 06/15/2021 ?Resolved Problems ?Electronic Signature(s) ?Signed: 11/27/2021 2:56:03 PM By: [name] EMT ?Signed: 11/27/2021 4:23:17 PM By: [name] DO ?Entered By: [name] on 11/27/2021 14:56:03 ?-------------------------------------------------------------------------------- ?SuperBill Details ?Patient Name: ?Date of Service: ?[name], [name] 11/27/2021 ?Medical Record Number: [id number] ?Patient Account Number: [id number] ?Date of Birth/Sex: ?Treating RN: ?03-26-1941 (81 y.o. [name], [name] ?Primary Care Provider: [name] ?Other Clinician: ?Referring Provider: ?Treating Provider/[name]: [name] ?[name] ?Weeks in Treatment: 23 ?Diagnosis Coding ?ICD-10 Codes ?Code Description ?E11.621 Type 2 diabetes mellitus with foot ulcer ?T81.31XD Disruption of external operation (surgical) wound, not elsewhere classified, subsequent encounter ?L97.514 Non-pressure chronic ulcer of other part of right foot with necrosis of bone ?E11.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 ?I69.629 Other chronic osteomyelitis, right ankle and foot ?Facility Procedures ?CPT4 Code: 52841324 ?Description: G0277-(Facility Use Only) HBOT full body chamber, 23mn , ICD-10 Diagnosis Description 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 Disruption of external operation (surgical) wound, not elsewhere classified, subsequent encounter 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, right ankle and foot ?Quantity: 1 ?Electronic Signature(s) ?Signed: 11/27/2021 2:55:57 PM By: GValeria BatmanEMT ?Signed: 11/27/2021 4:23:17 PM By: HKalman ShanDO ?Entered By: GValeria Batmanon 11/27/2021 14:55:56 ?

## 2021-11-28 ENCOUNTER — Other Ambulatory Visit: Payer: Self-pay | Admitting: Endocrinology

## 2021-11-28 ENCOUNTER — Other Ambulatory Visit: Payer: Self-pay

## 2021-11-28 ENCOUNTER — Other Ambulatory Visit (INDEPENDENT_AMBULATORY_CARE_PROVIDER_SITE_OTHER): Payer: Medicare Other

## 2021-11-28 ENCOUNTER — Encounter (HOSPITAL_BASED_OUTPATIENT_CLINIC_OR_DEPARTMENT_OTHER): Payer: Medicare Other | Admitting: Internal Medicine

## 2021-11-28 DIAGNOSIS — E1165 Type 2 diabetes mellitus with hyperglycemia: Secondary | ICD-10-CM

## 2021-11-28 DIAGNOSIS — T8131XD Disruption of external operation (surgical) wound, not elsewhere classified, subsequent encounter: Secondary | ICD-10-CM | POA: Diagnosis not present

## 2021-11-28 DIAGNOSIS — M86671 Other chronic osteomyelitis, right ankle and foot: Secondary | ICD-10-CM

## 2021-11-28 DIAGNOSIS — E11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: Secondary | ICD-10-CM | POA: Diagnosis not present

## 2021-11-28 DIAGNOSIS — E11621 Type 2 diabetes mellitus with foot ulcer: Secondary | ICD-10-CM

## 2021-11-28 DIAGNOSIS — L97514 Non-pressure chronic ulcer of other part of right foot with necrosis of bone: Secondary | ICD-10-CM | POA: Diagnosis not present

## 2021-11-28 LAB — BASIC METABOLIC PANEL
BUN: 31 mg/dL — ABNORMAL HIGH (ref 6–23)
CO2: 31 mEq/L (ref 19–32)
Calcium: 10.3 mg/dL (ref 8.4–10.5)
Chloride: 102 mEq/L (ref 96–112)
Creatinine, Ser: 1.02 mg/dL (ref 0.40–1.20)
GFR: 52.25 mL/min — ABNORMAL LOW (ref >60.00)
Glucose, Bld: 174 mg/dL — ABNORMAL HIGH (ref 70–99)
Potassium: 4.4 mEq/L (ref 3.5–5.1)
Sodium: 141 mEq/L (ref 135–145)

## 2021-11-28 LAB — GLUCOSE, CAPILLARY
Glucose-Capillary: 163 mg/dL — ABNORMAL HIGH (ref 70–99)
Glucose-Capillary: 131 mg/dL — ABNORMAL HIGH (ref 70–99)

## 2021-11-28 NOTE — Progress Notes (Signed)
CLASSIE, WENG ([id number]) ?Visit Report for 11/27/2021 ?Arrival Information Details ?Patient Name: Date of Service: ?[name], [name] 11/27/2021 1:00 PM ?Medical Record Number: [id number] ?Patient Account Number: [id number] ?Date of Birth/Sex: Treating RN: ?1941/01/31 (81 y.o. [name], [name] ?Primary Care Alexus Michael: [name] ?Other Clinician: [name] ?Referring Janeah Kovacich: ?Treating Rad Gramling/[name]: [name] ?[name] ?Weeks in Treatment: 23 ?Visit Information History Since Last Visit ?All ordered tests and consults were completed: Yes ?Patient Arrived: Ambulatory ?Added or deleted any medications: No ?Arrival Time: 12:50 ?Any new allergies or adverse reactions: No ?Accompanied By: self ?Had a fall or experienced change in No ?Transfer Assistance: None ?activities of daily living that may affect ?Patient Identification Verified: Yes ?risk of falls: ?Secondary Verification Process Completed: Yes ?Signs or symptoms of abuse/neglect since last visito No ?Patient Requires Transmission-Based Precautions: No ?Hospitalized since last visit: No ?Patient Has Alerts: Yes ?Implantable device outside of the clinic excluding No ?Patient Alerts: Patient on Blood Thinner cellular tissue based products placed in the center ?L ABI: 0.82 since last visit: ?R ABI: 0.47 ?Pain Present Now: No ?Electronic Signature(s) ?Signed: 11/28/2021 1:56:43 PM By: [name] CHT EMT BS ?, , ?Entered By: [name] on 11/27/2021 13:23:09 ?-------------------------------------------------------------------------------- ?Encounter Discharge Information Details ?Patient Name: Date of Service: ?[name], [name] 11/27/2021 1:00 PM ?Medical Record Number: [id number] ?Patient Account Number: [id number] ?Date of Birth/Sex: Treating RN: ?11-24-1940 (81 y.o. [name], [name] ?Primary Care Miyu Fenderson: [name] ?Other Clinician: [name] ?Referring Fabiano Ginley: ?Treating Traylen Eckels/[name]: [name] ?[name] ?Weeks in Treatment: 23 ?Encounter Discharge Information Items ?Discharge Condition: Stable ?Ambulatory Status: Ambulatory ?Discharge Destination: Home ?Transportation: Private Auto ?Accompanied By: self ?Schedule Follow-up Appointment: No ?Clinical Summary of Care: ?Electronic Signature(s) ?Signed: 11/27/2021 4:11:56 PM By: [name] CHT EMT BS ?, , ?Entered By: [name] on 11/27/2021 16:11:56 ?-------------------------------------------------------------------------------- ?Vitals Details ?Patient Name: ?Date of Service: ?[name], [name] 11/27/2021 1:00 PM ?Medical Record Number: [id number] ?Patient Account Number: [id number] ?Date of Birth/Sex: ?Treating RN: ?Oct 10, 1940 (81 y.o. [name], [name] ?Primary Care Collins Dimaria: [name] ?Other Clinician: [name] ?Referring Vanassa Penniman: ?Treating Adhira Jamil/[name]: [name] ?[name] ?Weeks in Treatment: 23 ?Vital Signs ?Time Taken: 12:50 ?Temperature (??F): 97.8 ?Height (in): 64 ?Pulse (bpm): 72 ?Weight (lbs): 135 ?Respiratory Rate (breaths/min): 12 ?Body Mass Index (BMI): 23.2 ?Blood Pressure (mmHg): 158/60 ?Capillary Blood Glucose (mg/dl): 172 ?Reference Range: 80 - 120 mg / dl ?Airway ?Pulse Oximetry (%): 99 ?Electronic Signature(s) ?Signed: 11/27/2021 1:23:59 PM By: [name] CHT EMT BS ?, , ?Entered By: [name] on 11/27/2021 13:23:59 ?

## 2021-11-28 NOTE — Progress Notes (Signed)
GRANT, HENKES ([id number]) ?Visit Report for 11/27/2021 ?HBO Details ?Patient Name: Date of Service: ?[name], [name] 11/27/2021 1:00 PM ?Medical Record Number: [id number] ?Patient Account Number: [id number] ?Date of Birth/Sex: Treating RN: ?September 03, 1941 (81 y.o. 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 ?HBO Treatment Details ?Treatment Number: 63 ?Patient Type: Outpatient ?Chamber Type: Monoplace ?Chamber Serial #: [id number] ?Treatment Protocol: 2.0 ATA with 90 minutes oxygen, and no air breaks ?Treatment Details ?Compression Rate Down: 2.0 psi / minute ?De-Compression Rate Up: 2.0 psi / minute ?Air ?breaks ?and ?breathing Decompress Decompress ?Compress Tx Pressure ?Begins Reached periods Begins Ends ?(leave ?unused ?spaces ?blank) ?Chamber Pressure (ATA 1 2 ------2 1 ?) ?Clock Time (24 hr) 12:55 13:04 - - - - - - 14:35 14:42 ?Treatment Length: 107 (minutes) ?Treatment Segments: 4 ?Vital Signs ?Capillary Blood Glucose Reference Range: 80 - 120 mg / dl ?HBO Diabetic Blood Glucose Intervention Range: <131 mg/dl or >249 mg/dl ?Time Vitals Blood Respiratory Capillary Blood Glucose Pulse Action ?Type: ?Pulse: Temperature: ?Taken: ?Pressure: ?Rate: ?Glucose (mg/dl): ?Meter #: Oximetry (%) Taken: ?Pre 12:50 158/60 72 12 97.8 172 99 ?Post 14:50 157/58 73 14 97.9 115 100 ?Treatment Response ?Treatment Toleration: Well ?Treatment Completion Status: Treatment Completed without Adverse Event ?Treatment Notes ?The patient  did well today with her treatment. ?Additional Procedure Documentation ?Tissue Sevierity: Fat layer exposed ?Physician HBO Attestation: ?I certify that I supervised this HBO treatment in accordance with Medicare ?guidelines. A trained emergency response team is readily available per Yes ?hospital policies and procedures. ?Continue HBOT as ordered. Yes ?Electronic Signature(s) ?Signed: 11/27/2021 4:23:17 PM By: [name] DO ?Previous Signature: 11/27/2021 4:11:11 PM Version By: [name] CHT EMT BS ?, , ?Previous Signature: 11/27/2021 2:55:05 PM Version By: [name] EMT ?Entered By: [name] on 11/27/2021 16:22:25 ?-------------------------------------------------------------------------------- ?HBO Safety Checklist Details ?Patient Name: ?Date of Service: ?[name], [name] 11/27/2021 1:00 PM ?Medical Record Number: [id number] ?Patient Account Number: [id number] ?Date of Birth/Sex: ?Treating RN: ?Aug 29, 1941 (81 y.o. [name], [name] ?Primary Care Karima Carrell: [name] ?Other Clinician: [name] ?Referring Timoty Bourke: ?Treating Tamme Mozingo/[name]: [name] ?[name] ?Weeks in Treatment: 23 ?HBO Safety Checklist Items ?Safety Checklist ?Consent Form Signed ?Patient voided / foley secured and emptied ?When did you last eato 0830 ?Last dose of injectable or oral agent 0830 ?Ostomy pouch emptied and vented if applicable ?NA ?All implantable devices assessed, documented and approved ?NA ?Intravenous access site secured and place ?Valuables secured ?Linens and cotton and cotton/polyester blend (less than 51% polyester) ?Personal oil-based products / skin lotions / body lotions removed ?Wigs or hairpieces removed ?Smoking or tobacco materials removed ?NA ?Books / newspapers / magazines / loose paper removed ?Cologne, aftershave, perfume and deodorant removed ?Jewelry removed (may wrap wedding band) ?Make-up removed ?Hair care products removed ?Battery operated devices  (external) removed ?Heating patches and chemical warmers removed ?Titanium eyewear removed ?Nail polish cured greater than 10 hours ?Casting material cured greater than 10 hours ?NA ?Hearing aids removed ?NA ?Loose dentures or partials removed ?NA ?Prosthetics have been removed ?NA ?Patient demonstrates correct use of air break device (if applicable) ?Patient concerns  have been addressed ?Patient grounding bracelet on and cord attached to chamber ?Specifics for Inpatients (complete in addition to above) ?Medication sheet sent with patient ?NA ?Intravenous medications needed or due during therapy sent with patient ?NA ?Drainage tubes (e.g. nasogastric tube or chest tube secured and vented) ?NA ?Endotracheal or Tracheotomy tube secured ?NA ?Cuff deflated of air and inflated with saline ?NA ?Airway suctioned ?NA ?Notes ?Paper version used prior to treatment. ?Electronic Signature(s) ?Signed: 11/28/2021 1:56:43 PM By: [name] CHT EMT BS ?, , ?Entered By: [name] on 11/27/2021 13:25:33 ?

## 2021-11-29 ENCOUNTER — Encounter (HOSPITAL_BASED_OUTPATIENT_CLINIC_OR_DEPARTMENT_OTHER): Payer: Medicare Other | Admitting: Internal Medicine

## 2021-11-29 ENCOUNTER — Telehealth: Payer: Self-pay

## 2021-11-29 ENCOUNTER — Encounter (HOSPITAL_BASED_OUTPATIENT_CLINIC_OR_DEPARTMENT_OTHER): Payer: Medicare Other | Admitting: General Surgery

## 2021-11-29 DIAGNOSIS — D638 Anemia in other chronic diseases classified elsewhere: Secondary | ICD-10-CM | POA: Diagnosis not present

## 2021-11-29 DIAGNOSIS — I70202 Unspecified atherosclerosis of native arteries of extremities, left leg: Secondary | ICD-10-CM | POA: Diagnosis not present

## 2021-11-29 DIAGNOSIS — Z4781 Encounter for orthopedic aftercare following surgical amputation: Secondary | ICD-10-CM | POA: Diagnosis not present

## 2021-11-29 DIAGNOSIS — I10 Essential (primary) hypertension: Secondary | ICD-10-CM | POA: Diagnosis not present

## 2021-11-29 DIAGNOSIS — L97514 Non-pressure chronic ulcer of other part of right foot with necrosis of bone: Secondary | ICD-10-CM | POA: Diagnosis not present

## 2021-11-29 DIAGNOSIS — Z89412 Acquired absence of left great toe: Secondary | ICD-10-CM | POA: Diagnosis not present

## 2021-11-29 DIAGNOSIS — E11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: Secondary | ICD-10-CM | POA: Diagnosis not present

## 2021-11-29 DIAGNOSIS — L97512 Non-pressure chronic ulcer of other part of right foot with fat layer exposed: Secondary | ICD-10-CM | POA: Diagnosis not present

## 2021-11-29 DIAGNOSIS — T8131XD Disruption of external operation (surgical) wound, not elsewhere classified, subsequent encounter: Secondary | ICD-10-CM | POA: Diagnosis not present

## 2021-11-29 DIAGNOSIS — E11621 Type 2 diabetes mellitus with foot ulcer: Secondary | ICD-10-CM | POA: Diagnosis not present

## 2021-11-29 DIAGNOSIS — E11628 Type 2 diabetes mellitus with other skin complications: Secondary | ICD-10-CM | POA: Diagnosis not present

## 2021-11-29 DIAGNOSIS — M86671 Other chronic osteomyelitis, right ankle and foot: Secondary | ICD-10-CM | POA: Diagnosis not present

## 2021-11-29 LAB — FRUCTOSAMINE: Fructosamine: 261 umol/L (ref 0–285)

## 2021-11-29 LAB — GLUCOSE, CAPILLARY
Glucose-Capillary: 141 mg/dL — ABNORMAL HIGH (ref 70–99)
Glucose-Capillary: 68 mg/dL — ABNORMAL LOW (ref 70–99)

## 2021-11-29 NOTE — Progress Notes (Signed)
MILLETTE, HALBERSTAM ([id number]) ?Visit Report for 11/28/2021 ?Arrival Information Details ?Patient Name: Date of Service: ?[name], [name] 11/28/2021 1:00 PM ?Medical Record Number: [id number] ?Patient Account Number: [id number] ?Date of Birth/Sex: Treating RN: ?[birth year] (81 y.o. F) [name], [name] ?Primary Care Sergio Zawislak: [name] ?Other Clinician: [name] ?Referring Davaughn Hillyard: ?Treating Kamyah Wilhelmsen/[name]: [name] ?[name] ?Weeks in Treatment: 23 ?Visit Information History Since Last Visit ?All ordered tests and consults were completed: Yes ?Patient Arrived: Ambulatory ?Added or deleted any medications: No ?Arrival Time: 12:44 ?Any new allergies or adverse reactions: No ?Accompanied By: self ?Had a fall or experienced change in No ?Transfer Assistance: None ?activities of daily living that may affect ?Patient Identification Verified: Yes ?risk of falls: ?Secondary Verification Process Completed: Yes ?Signs or symptoms of abuse/neglect since last visito No ?Patient Requires Transmission-Based Precautions: No ?Hospitalized since last visit: No ?Patient Has Alerts: Yes ?Implantable device outside of the clinic excluding No ?Patient Alerts: Patient on Blood Thinner cellular tissue based products placed in the center ?L ABI: 0.82 since last visit: ?R ABI: 0.47 ?Pain Present Now: No ?Electronic Signature(s) ?Signed: 11/29/2021 4:57:38 PM By: [name] CHT EMT BS ?, , ?Entered By: [name] on 11/28/2021 14:34:15 ?-------------------------------------------------------------------------------- ?Vitals Details ?Patient Name: Date of Service: ?[name], [name] 11/28/2021 1:00 PM ?Medical Record Number: [id number] ?Patient Account Number: [id number] ?Date of Birth/Sex: Treating RN: ?07-23-1941 (81 y.o. F) [name], [name] ?Primary Care Tamella Tuccillo: [name] ?Other Clinician: [name] ?Referring Kynadee Dam: ?Treating Hiroto Saltzman/[name]: [name] ?[name] ?Weeks in Treatment: 23 ?Vital Signs ?Time Taken: 12:49 ?Temperature (??F): 97.5 ?Height (in): 64 ?Pulse (bpm): 72 ?Weight (lbs): 135 ?Respiratory Rate (breaths/min): 18 ?Body Mass Index (BMI): 23.2 ?Blood Pressure (mmHg): 142/58 ?Capillary Blood Glucose (mg/dl): 163 ?Reference Range: 80 - 120 mg / dl ?Airway ?Pulse Oximetry (%): 100 ?Electronic Signature(s) ?Signed: 11/29/2021 4:57:38 PM By: [name] CHT EMT BS ?, , ?Entered By: [name] on 11/28/2021 14:34:51 ?

## 2021-11-29 NOTE — Telephone Encounter (Signed)
Left voicemail asking patient to return my call. Dr.Snider agreeable to patient being seen on 12/18/2021. Picc line is to be removed on 12/11/2021  ? ? ?Yasmene Salomone P Tylisa Alcivar, CMA  ?

## 2021-11-29 NOTE — Progress Notes (Signed)
[name], [name] ([id number]) ?Visit Report for 11/28/2021 ?HBO Details ?Patient Name: Date of Service: ?[name], [name] 11/28/2021 1:00 PM ?Medical Record Number: [id number] ?Patient Account Number: [id number] ?Date of Birth/Sex: Treating RN: ?[birth year] (81 y.o. 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 ?HBO Treatment Details ?Treatment Number: 14 ?Patient Type: Outpatient ?Chamber Type: Monoplace ?Chamber Serial #: [id number] ?Treatment Protocol: 2.0 ATA with 90 minutes oxygen, and no air breaks ?Treatment Details ?Compression Rate Down: 2.0 psi / minute ?De-Compression Rate Up: 2.0 psi / minute ?Air ?breaks ?and ?breathing Decompress Decompress ?Compress Tx Pressure ?Begins Reached periods Begins Ends ?(leave ?unused ?spaces ?blank) ?Chamber Pressure (ATA 1 2 ------2 1 ?) ?Clock Time (24 hr) 13:13 13:22 - - - - - - 14:52 15:00 ?Treatment Length: 107 (minutes) ?Treatment Segments: 4 ?Vital Signs ?Capillary Blood Glucose Reference Range: 80 - 120 mg / dl ?HBO Diabetic Blood Glucose Intervention Range: <131 mg/dl or >249 mg/dl ?Time Vitals Blood Respiratory Capillary Blood Glucose Pulse Action ?Type: ?Pulse: Temperature: ?Taken: ?Pressure: ?Rate: ?Glucose (mg/dl): ?Meter #: Oximetry (%) Taken: ?Pre 12:49 142/58 72 18 97.5 163 2 100 ?Post 15:04 138/58 67 18 98.2 131 2 100 ?Treatment Response ?Treatment Toleration: Well ?Treatment Completion Status: Treatment Completed without Adverse Event ?Additional Procedure  Documentation ?Tissue Sevierity: Fat layer exposed ?Physician HBO Attestation: ?I certify that I supervised this HBO treatment in accordance with Medicare ?guidelines. A trained emergency response team is readily available per Yes ?hospital policies and procedures. ?Continue HBOT as ordered. Yes ?Electronic Signature(s) ?Signed: 11/28/2021 4:29:59 PM By: [name] DO ?Entered By: Kalman [name] on 11/28/2021 16:11:30 ?-------------------------------------------------------------------------------- ?HBO Safety Checklist Details ?Patient Name: ?Date of Service: ?[name], [name] 11/28/2021 1:00 PM ?Medical Record Number: [id number] ?Patient Account Number: [id number] ?Date of Birth/Sex: ?Treating RN: ?1940/10/13 (81 y.o. F) [name] ?Primary Care [name]: [name] ?Other Clinician: Donavan [name] ?Referring [name]: ?Treating [name]/[name]: Kalman [name] ?[name] ?Weeks in Treatment: 23 ?HBO Safety Checklist Items ?Safety Checklist ?Consent Form Signed ?Patient voided / foley secured and emptied ?When did you last eato 1200 ?Last dose of injectable or oral agent 0830 ?Ostomy pouch emptied and vented if applicable ?NA ?All implantable devices assessed, documented and approved ?NA ?Intravenous access site secured and place Right Forearm PICC ?Valuables secured ?Linens and cotton and cotton/polyester blend (less than 51% polyester) ?Personal oil-based products / skin lotions / body lotions removed ?Wigs or hairpieces removed Patient removed item. ?Smoking or tobacco materials removed ?NA ?Books / newspapers / magazines / loose paper removed ?Cologne, aftershave, perfume and deodorant removed ?Jewelry removed (may wrap wedding band) ?Make-up removed ?Hair care products removed ?Battery operated devices (external) removed ?Heating patches and chemical warmers removed ?Titanium eyewear removed ?Nail polish cured greater than 10 hours ?Casting material cured greater than 10  hours ?NA ?Hearing aids removed ?NA ?Loose dentures or partials removed ?NA ?Prosthetics have been removed ?NA ?Patient demonstrates correct use of air break device (if applicable) ?Patient concerns have been addressed ?Patient grounding bracelet on and cord attached to chamber ?Specifics for Inpatients (complete in addition to above) ?Medication sheet sent with patient ?NA ?  Intravenous medications needed or due during therapy sent with patient ?NA ?Drainage tubes (e.g. nasogastric tube or chest tube secured and vented) ?NA ?Endotracheal or Tracheotomy tube secured ?NA ?Cuff deflated of air and inflated with saline ?NA ?Airway suctioned ?NA ?Notes ?Paper version used prior to treatment. ?Electronic Signature(s) ?Signed: 11/29/2021 4:57:38 PM By: [name] CHT EMT BS ?, , ?Entered By: [name] on 11/28/2021 14:36:38 ?

## 2021-11-29 NOTE — Progress Notes (Signed)
ANIYLA, HARLING ([id number]) ?Visit Report for 11/29/2021 ?Physician Orders Details ?Patient Name: Date of Service: ?[name], [name] 11/29/2021 1:15 PM ?Medical Record Number: [id number] ?Patient Account Number: [id number] ?Date of Birth/Sex: Treating RN: ?September 05, 1941 (81 y.o. 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 11/29/2021 1:15 PM ?Medical Record Number: [id number] ?Patient Account Number: [id number] ?Date of Birth/Sex: Treating RN: ?[birth year] (81 y.o. [name], [name] ?Primary Care Provider: Sela [name] ?Other Clinician: [name] ?Referring Provider: ?Treating Provider/[name]: [name] ?[name] ?Weeks in Treatment: 23 ?Active Problems ?ICD-10 ?Encounter ?Code Description Active Date MDM ?Diagnosis ?E11.621 Type 2 diabetes mellitus with foot ulcer 06/15/2021 No Yes ?T81.31XD Disruption of external operation (surgical) wound, not elsewhere classified, 06/15/2021 No Yes ?subsequent encounter ?L97.514 Non-pressure chronic ulcer of other part of right foot with  necrosis of bone 09/20/2021 No Yes ?E11.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 06/15/2021 No Yes ?F09.323 Other chronic osteomyelitis, right ankle and foot 10/25/2021 No Yes ?Inactive Problems ?ICD-10 ?Code Description Active Date Inactive Date ?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 11/29/2021 ?Medical Record Number: [id number] ?Patient Account Number: [id number] ?Date of Birth/Sex: ?Treating RN: ?[birth year] (81 y.o. 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 ?K27.062 Other chronic osteomyelitis, right ankle and foot ?Facility Procedures ?CPT4 Code: 37628315 ?Description: G0277-(Facility Use Only) HBOT full body chamber, 70mn , ICD-10 Diagnosis Description L97.514 Non-pressure chronic ulcer of other part of right foot with necrosis of bone 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, right ankle and  foot T81.31XD  ?Disruption of external operation (surgical) wound, not elsewhere classified, subse E11.621 Type 2 diabetes mellitus with foot ulcer ?Modifier: quent encounter ?Quantity: 4 ?Physician Procedures ?: CPT4 Code Description Modifier [id number] 37106- WC PHYS HYPERBARIC OXYGEN THERAPY ICD-10 Diagnosis Description L97.514 Non-pressure chronic ulcer of other part of right foot with necrosis of bone 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, right ankle and foot ? T81.31XD Disruption of external operation (surgical) wound, not elsewhere classified, subsequent encounter E11.621 Type 2 diabetes mellitus with foot ulcer ?Quantity: 1 ?Electronic Signature(s) ?Signed: 11/29/2021  4:54:50 PM By: [name] MD FACS ?Previous Signature: 11/29/2021 3:21:09 PM Version By: [name] EMT ?Entered By: [name] on 11/29/2021 16:54:50 ?

## 2021-11-29 NOTE — Progress Notes (Signed)
[name], [name] ([id number]) ?Visit Report for 11/28/2021 ?SuperBill Details ?Patient Name: Date of Service: ?[name], [name] 11/28/2021 ?Medical Record Number: [id number] ?Patient Account Number: [id number] ?Date of Birth/Sex: Treating RN: ?1940/10/25 (81 y.o. 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 ?O37.858 Other chronic osteomyelitis, right ankle and foot ?Facility Procedures ?CPT4 Code Description Modifier Quantity ?[id number] G0277-(Facility Use Only) HBOT full body chamber, 64mn ?, 4 ?ICD-10 Diagnosis Description ?E11.621 Type 2 diabetes mellitus with foot ulcer ?L97.514 Non-pressure chronic ulcer of other part of right foot with necrosis of bone ?MO87.867Other chronic osteomyelitis, right ankle and foot ?T81.31XD Disruption of external operation (surgical) wound, not elsewhere classified, subsequent encounter ?Physician Procedures ?Quantity ?CPT4 Code Description Modifier ?[id number] PHYS HYPERBARIC OXYGEN THERAPY 1 ?ICD-10 Diagnosis Description ?E11.621 Type 2 diabetes mellitus with foot ulcer ?L97.514 Non-pressure chronic ulcer of other part of right foot with necrosis of bone ?MZ66.294Other chronic osteomyelitis, right ankle and foot ?T81.31XD Disruption of external operation (surgical) wound, not elsewhere classified, subsequent encounter ?Electronic Signature(s) ?Signed: 11/28/2021 4:29:59 PM By: HKalman ShanDO ?Signed: 11/29/2021 4:57:38 PM By: SDonavan Burnet CHT EMT BS ?, , ?Entered By: SDonavan Burneton 11/28/2021 15:20:54 ?

## 2021-11-29 NOTE — Progress Notes (Addendum)
PRECILLA, PURNELL ([id number]) ?Visit Report for 11/29/2021 ?Arrival Information Details ?Patient Name: Date of Service: ?[name], [name] 11/29/2021 1:15 PM ?Medical Record Number: [id number] ?Patient Account Number: [id number] ?Date of Birth/Sex: Treating RN: ?1940-11-19 (81 y.o. [name], [name] ?Primary Care Marvella Jenning: [name] ?Other Clinician: [name] ?Referring Briel Gallicchio: ?Treating Lashauna Arpin/[name]: [name] ?[name] ?Weeks in Treatment: 23 ?Visit Information History Since Last Visit ?All ordered tests and consults were completed: Yes ?Patient Arrived: Ambulatory ?Added or deleted any medications: No ?Arrival Time: 12:34 ?Any new allergies or adverse reactions: No ?Accompanied By: None ?Had a fall or experienced change in No ?Transfer Assistance: None ?activities of daily living that may affect ?Patient Identification Verified: Yes ?risk of falls: ?Secondary Verification Process Completed: Yes ?Signs or symptoms of abuse/neglect since last visito No ?Patient Requires Transmission-Based Precautions: No ?Hospitalized since last visit: No ?Patient Has Alerts: Yes ?Implantable device outside of the clinic excluding No ?Patient Alerts: Patient on Blood Thinner cellular tissue based products placed in the center ?since last visit: ?Pain Present Now: No ?Notes ?Paper version used prior to treatment. ?Electronic Signature(s) ?Signed: 11/29/2021 2:21:07 PM By: [name] EMT ?Entered By: [name] on 11/29/2021 14:21:07 ?-------------------------------------------------------------------------------- ?Encounter Discharge Information Details ?Patient Name: Date of Service: ?[name], [name] 11/29/2021 1:15 PM ?Medical Record Number: [id number] ?Patient Account Number: [id number] ?Date of Birth/Sex: Treating RN: ?15-Apr-1941 (81 y.o. [name], [name] ?Primary Care Neeta Storey: [name] ?Other Clinician: [name] ?Referring Minal Stuller: ?Treating Maureena Dabbs/[name]: [name] ?[name] ?Weeks in Treatment: 23 ?Encounter Discharge Information Items ?Discharge Condition: Stable ?Ambulatory Status: Ambulatory ?Discharge Destination: Home ?Transportation: Private Auto ?Accompanied By: None ?Schedule Follow-up Appointment: Yes ?Clinical Summary of Care: ?Notes ?The patient waited 30 min after drinking the Glucerna. I asked the patient if she was ok. She stated "I fine." ?Electronic Signature(s) ?Signed: 11/29/2021 3:29:44 PM By: [name] EMT ?Entered By: [name] on 11/29/2021 15:29:43 ?-------------------------------------------------------------------------------- ?Patient/Caregiver Education Details ?Patient Name: ?Date of Service: ?[name], [name] 3/8/2023andnbsp1:15 PM ?Medical Record Number: [id number] ?Patient Account Number: [id number] ?Date of Birth/Gender: ?Treating RN: ?[birth year] (81 y.o. [name], [name] ?Primary Care Physician: [name] ?Other Clinician: [name] ?Referring Physician: ?Treating Physician/[name]: [name] ?[name] ?Weeks in Treatment: 23 ?Education Assessment ?Education Provided To: ?Patient ?Education Topics Provided ?Electronic Signature(s) ?Signed: 11/29/2021 5:20:23 PM By: [name] EMT ?Entered By: [name] on 11/29/2021 15:21:43 ?-------------------------------------------------------------------------------- ?Vitals Details ?Patient Name: ?Date of Service: ?[name], [name] 11/29/2021 1:15 PM ?Medical Record Number: [id number] ?Patient Account Number: [id number] ?Date of Birth/Sex: ?Treating RN: ?02-05-1941 (81 y.o. [name], [name] ?Primary Care Madelaine Whipple: [name] ?Other Clinician: [name] ?Referring Sabastien Tyler: ?Treating Tabathia Knoche/[name]: [name] ?[name] ?Weeks in Treatment: 23 ?Vital Signs ?Time Taken: 12:38 ?Temperature (??F): 97.7 ?Height (in): 64 ?Pulse (bpm): 77 ?Weight (lbs): 135 ?Respiratory Rate (breaths/min): 16 ?Body Mass Index  (BMI): 23.2 ?Blood Pressure (mmHg): 148/60 ?Capillary Blood Glucose (mg/dl): 141 ?Reference Range: 80 - 120 mg / dl ?Electronic Signature(s) ?Signed: 11/29/2021 2:21:56 PM By: [name] EMT ?Entered By: [name] on 11/29/2021 14:21:56 ?

## 2021-11-29 NOTE — Telephone Encounter (Signed)
-----   Message from [place] sent at 11/28/2021  4:50 PM EST ----- ?Patient called in regards to getting her 2 week follow up scheduled with Dr. Baxter Flattery, scheduled patient for Dr. Storm Frisk first available which is on the 27th, patient stated that she will end her IV antibiotics on the 20th.. She wanted to check to see if the appointment on the 27th was good..  ? ?

## 2021-11-29 NOTE — Progress Notes (Addendum)
MONICA, CODD ([id number]) ?Visit Report for 11/29/2021 ?HBO Details ?Patient Name: Date of Service: ?[name], [name] 11/29/2021 1:15 PM ?Medical Record Number: [id number] ?Patient Account Number: [id number] ?Date of Birth/Sex: Treating RN: ?Apr 26, 1941 (81 y.o. 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 ?HBO Treatment Details ?Treatment Number: 15 ?Patient Type: Outpatient ?Chamber Type: Monoplace ?Chamber Serial #: [id number] ?Treatment Protocol: 2.0 ATA with 90 minutes oxygen, and no air breaks ?Treatment Details ?Compression Rate Down: 2.0 psi / minute De-Compression Rate Up: ?Air ?breaks ?and ?breathing Decompress Decompress ?Compress Tx Pressure ?Begins Reached periods Begins Ends ?(leave ?unused ?spaces ?blank) ?Chamber Pressure (ATA 1 2 ------2 1 ?) ?Clock Time (24 hr) 13:02 13:10 - - - - - - 14:40 14:49 ?Treatment Length: 107 (minutes) ?Treatment Segments: 4 ?Vital Signs ?Capillary Blood Glucose Reference Range: 80 - 120 mg / dl ?HBO Diabetic Blood Glucose Intervention Range: <131 mg/dl or >249 mg/dl ?Type: Time Vitals Blood Pulse: Respiratory Temperature: Capillary Blood Glucose Pulse Action ?Taken: ?Pressure: ?Rate: ?Glucose (mg/dl): Meter #: Oximetry (%) Taken: ?Pre 12:38 148/60 77 16 97.7 141 ?Post 14:51 163/64 77 14 98 68 Patient given 1 Glucerna to drink. ?Treatment Response ?Treatment Toleration: Well ?Treatment Completion Status: Treatment Completed without Adverse Event ?Treatment Notes ?The  patient was given 1- 8oz Glucerna to drink. ?Additional Procedure Documentation ?Tissue Sevierity: Fat layer exposed ?Physician HBO Attestation: ?I certify that I supervised this HBO treatment in accordance with Medicare ?guidelines. A trained emergency response team is readily available per Yes ?hospital policies and procedures. ?Continue HBOT as ordered. Yes ?Electronic Signature(s) ?Signed: 11/29/2021 4:54:40 PM By: [name] Maudlin MD FACS ?Previous Signature: 11/29/2021 3:20:22 PM Version By: [name] EMT ?Previous Signature: 11/29/2021 3:01:27 PM Version By: [name] EMT ?Previous Signature: 11/29/2021 2:25:45 PM Version By: [name] EMT ?Entered By: [name] on 11/29/2021 16:54:39 ?-------------------------------------------------------------------------------- ?HBO Safety Checklist Details ?Patient Name: ?Date of Service: ?[name], [name] 11/29/2021 1:15 PM ?Medical Record Number: [id number] ?Patient Account Number: [id number] ?Date of Birth/Sex: ?Treating RN: ?Feb 10, 1941 (81 y.o. [name], [name] ?Primary Care [name]: [name] ?Other Clinician: Valeria [name] ?Referring [name]: ?Treating [name]/[name]: [name] ?[name] ?Weeks in Treatment: 23 ?HBO Safety Checklist Items ?Safety Checklist ?Consent Form Signed ?Patient voided / foley secured and emptied ?When did you last eato 1145 ?Last dose of injectable or oral agent 0900 ?Ostomy pouch emptied and vented if applicable ?NA ?All implantable devices assessed, documented and approved ?NA ?Intravenous access site secured and place ?Valuables secured ?Linens and cotton and cotton/polyester blend (less than 51% polyester) ?Personal oil-based products / skin lotions / body lotions removed ?Wigs or hairpieces removed ?Smoking or tobacco materials removed ?Books / newspapers / magazines / loose paper removed ?Cologne, aftershave, perfume and deodorant removed ?Jewelry removed (may wrap wedding  band) ?NA ?Make-up removed ?Hair care products removed ?Battery operated devices (external) removed ?Heating patches and chemical warmers removed ?Titanium eyewear removed ?NA ?Nail polish cured greater than 10 hours ?Casting material cured greater than 10 hours ?NA ?Hearing aids removed ?NA ?Loose dentures or partials removed ?NA ?Prosthetics have been removed ?NA ?Patient demonstrates correct  use of air break device (if applicable) ?Patient concerns have been addressed ?Patient grounding bracelet on and cord attached to chamber ?Specifics for Inpatients (complete in addition to above) ?Medication sheet sent with patient ?NA ?Intravenous medications needed or due during therapy sent with patient ?NA ?Drainage tubes (e.g. nasogastric tube or chest tube secured and vented) ?NA ?Endotracheal or Tracheotomy tube secured ?NA ?Cuff deflated of air and inflated with saline ?NA ?Airway suctioned ?NA ?Notes ?Paper version used prior to treatment. ?Electronic Signature(s) ?Signed: 11/29/2021 2:28:59 PM By: [name] EMT ?Previous Signature: 11/29/2021 2:23:38 PM Version By: [name] EMT ?Entered By: [name] on 11/29/2021 14:28:59 ?

## 2021-11-30 ENCOUNTER — Encounter (HOSPITAL_BASED_OUTPATIENT_CLINIC_OR_DEPARTMENT_OTHER): Payer: Medicare Other | Admitting: General Surgery

## 2021-11-30 ENCOUNTER — Other Ambulatory Visit: Payer: Self-pay

## 2021-11-30 ENCOUNTER — Ambulatory Visit (HOSPITAL_COMMUNITY)
Admission: RE | Admit: 2021-11-30 | Discharge: 2021-11-30 | Disposition: A | Discharge status: Home / Self Care | Payer: Medicare Other | Source: Ambulatory Visit | Attending: Internal Medicine | Admitting: Internal Medicine

## 2021-11-30 DIAGNOSIS — E11621 Type 2 diabetes mellitus with foot ulcer: Secondary | ICD-10-CM | POA: Diagnosis not present

## 2021-11-30 DIAGNOSIS — T8131XD Disruption of external operation (surgical) wound, not elsewhere classified, subsequent encounter: Secondary | ICD-10-CM | POA: Diagnosis not present

## 2021-11-30 DIAGNOSIS — E11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: Secondary | ICD-10-CM | POA: Diagnosis not present

## 2021-11-30 DIAGNOSIS — L97514 Non-pressure chronic ulcer of other part of right foot with necrosis of bone: Secondary | ICD-10-CM | POA: Diagnosis not present

## 2021-11-30 DIAGNOSIS — L97512 Non-pressure chronic ulcer of other part of right foot with fat layer exposed: Secondary | ICD-10-CM | POA: Diagnosis not present

## 2021-11-30 DIAGNOSIS — I6523 Occlusion and stenosis of bilateral carotid arteries: Secondary | ICD-10-CM | POA: Diagnosis not present

## 2021-11-30 DIAGNOSIS — M86671 Other chronic osteomyelitis, right ankle and foot: Secondary | ICD-10-CM | POA: Diagnosis not present

## 2021-11-30 LAB — GLUCOSE, CAPILLARY
Glucose-Capillary: 180 mg/dL — ABNORMAL HIGH (ref 70–99)
Glucose-Capillary: 116 mg/dL — ABNORMAL HIGH (ref 70–99)

## 2021-11-30 NOTE — Progress Notes (Signed)
[name], [name] ([id number]) ?Visit Report for 11/30/2021 ?Problem List Details ?Patient Name: Date of Service: ?[name], [name] LLY H. 11/30/2021 1:00 PM ?Medical Record Number: [id number] ?Patient Account Number: [id number] ?Date of Birth/Sex: Treating RN: ?Mar 08, 1941 (81 y.o. 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 06/15/2021 No Yes ?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, right ankle and foot 10/25/2021 No Yes ?Inactive Problems ?ICD-10 ?Code Description Active Date Inactive Date ?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 11/30/2021 ?Medical Record Number: [id number] ?Patient Account Number: [id number] ?Date of Birth/Sex: ?Treating RN: ?Sep 28, 1940 (81 y.o. 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 ?O53.664 Other chronic osteomyelitis, right ankle and foot ?Facility Procedures ?CPT4 Code: 40347425 ?Description: G0277-(Facility Use Only) HBOT full body chamber, 43mn , ICD-10 Diagnosis Description 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 M86.671 Other  ?chronic osteomyelitis, right ankle and foot T81.31XD Disruption of external operation (surgical) wound, not elsewhere classified, subse ?Modifier: quent encounter ?Quantity: 4 ?Physician Procedures ?: CPT4 Code Description Modifier [id number] 64332- WC PHYS HYPERBARIC OXYGEN THERAPY ICD-10 Diagnosis Description 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

## 2021-11-30 NOTE — Progress Notes (Signed)
[name], [name] ([id number]) ?Visit Report for 11/30/2021 ?HBO Details ?Patient Name: Date of Service: ?[name], [name] 11/30/2021 1:00 PM ?Medical Record Number: [id number] ?Patient Account Number: [id number] ?Date of Birth/Sex: Treating RN: ?[birth year] (81 y.o. 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 ?HBO Treatment Details ?Treatment Number: 78 ?Patient Type: Outpatient ?Chamber Type: Monoplace ?Chamber Serial #: [id number] ?Treatment Protocol: 2.0 ATA with 90 minutes oxygen, and no air breaks ?Treatment Details ?Compression Rate Down: 2.0 psi / minute ?De-Compression Rate Up: 2.0 psi / minute ?Air ?breaks ?and ?breathing Decompress Decompress ?Compress Tx Pressure ?Begins Reached periods Begins Ends ?(leave ?unused ?spaces ?blank) ?Chamber Pressure (ATA 1 2 ------2 1 ?) ?Clock Time (24 hr) 13:02 13:10 - - - - - - 14:40 14:48 ?Treatment Length: 106 (minutes) ?Treatment Segments: 4 ?Vital Signs ?Capillary Blood Glucose Reference Range: 80 - 120 mg / dl ?HBO Diabetic Blood Glucose Intervention Range: <131 mg/dl or >249 mg/dl ?Time Vitals Blood Respiratory Capillary Blood Glucose Pulse Action ?Type: ?Pulse: Temperature: ?Taken: ?Pressure: ?Rate: ?Glucose (mg/dl): ?Meter #: Oximetry (%) Taken: ?Pre 12:58 138/62 73 16 97.6 180 2 100 ?Post 14:53 162/62 78 16 97.8 116 2 100 ?Treatment Response ?Treatment Toleration: Well ?Treatment Completion Status: Treatment Completed without Adverse Event ?Additional Procedure  Documentation ?Tissue Sevierity: Fat layer exposed ?Physician HBO Attestation: ?I certify that I supervised this HBO treatment in accordance with Medicare ?guidelines. A trained emergency response team is readily available per Yes ?hospital policies and procedures. ?Continue HBOT as ordered. Yes ?Electronic Signature(s) ?Signed: 11/30/2021 5:19:13 PM By: [name] MD FACS ?Previous Signature: 11/30/2021 1:47:50 PM Version By: [name] EMT ?Entered By: [name] on 11/30/2021 17:19:13 ?-------------------------------------------------------------------------------- ?HBO Safety Checklist Details ?Patient Name: ?Date of Service: ?[name], [name] 11/30/2021 1:00 PM ?Medical Record Number: [id number] ?Patient Account Number: [id number] ?Date of Birth/Sex: ?Treating RN: ?1941/08/10 (81 y.o. F) [name], [name] ?Primary Care Cornell Gaber: [name] ?Other Clinician: [name] ?Referring Leylani Duley: ?Treating Caileen Veracruz/[name]: [name] ?[name] ?Weeks in Treatment: 24 ?HBO Safety Checklist Items ?Safety Checklist ?Consent Form Signed ?Patient voided / foley secured and emptied ?When did you last eato 1200 ?Last dose of injectable or oral agent 0800 ?Ostomy pouch emptied and vented if applicable ?NA ?All implantable devices assessed, documented and approved ?NA ?Intravenous access site secured and place Right Forearm PICC ?Valuables secured ?Linens and cotton and cotton/polyester blend (less than 51% polyester) ?Personal oil-based products / skin lotions / body lotions removed ?Wigs or hairpieces removed Patient removed item. ?Smoking or tobacco materials removed ?NA ?Books / newspapers / magazines / loose paper removed ?Cologne, aftershave, perfume and deodorant removed ?Jewelry removed (may wrap wedding band) ?Make-up removed ?Hair care products removed ?Battery operated devices (external) removed ?Heating patches and chemical warmers removed ?Titanium eyewear removed ?NA ?Nail  polish cured greater than 10 hours ?Casting material cured greater than 10 hours ?NA ?Hearing aids removed ?NA ?Loose dentures or partials removed ?NA ?Prosthetics have been removed ?NA ?Patient demonstrates correct use of air break device (if applicable) ?Patient concerns have been addressed ?Patient grounding bracelet on and cord attached to chamber ?Specifics for  Inpatients (complete in addition to above) ?Medication sheet sent with patient ?NA ?Intravenous medications needed or due during therapy sent with patient ?NA ?Drainage tubes (e.g. nasogastric tube or chest tube secured and vented) ?NA ?Endotracheal or Tracheotomy tube secured ?NA ?Cuff deflated of air and inflated with saline ?NA ?Airway suctioned ?NA ?Notes ?Paper version used prior to treatment. ?Electronic Signature(s) ?Signed: 11/30/2021 1:47:30 PM By: [name] EMT ?Entered By: [name] on 11/30/2021 13:47:30 ?

## 2021-11-30 NOTE — Telephone Encounter (Signed)
Patient aware and to call us if she has any issues prior to appointment on 3/27. ? ? ?Innocence Schlotzhauer P Michai Dieppa, CMA ? ?

## 2021-12-01 ENCOUNTER — Encounter (HOSPITAL_BASED_OUTPATIENT_CLINIC_OR_DEPARTMENT_OTHER): Payer: Medicare Other | Admitting: Internal Medicine

## 2021-12-01 DIAGNOSIS — M86671 Other chronic osteomyelitis, right ankle and foot: Secondary | ICD-10-CM | POA: Diagnosis not present

## 2021-12-01 DIAGNOSIS — E11621 Type 2 diabetes mellitus with foot ulcer: Secondary | ICD-10-CM | POA: Diagnosis not present

## 2021-12-01 DIAGNOSIS — E11628 Type 2 diabetes mellitus with other skin complications: Secondary | ICD-10-CM | POA: Diagnosis not present

## 2021-12-01 DIAGNOSIS — Z4781 Encounter for orthopedic aftercare following surgical amputation: Secondary | ICD-10-CM | POA: Diagnosis not present

## 2021-12-01 DIAGNOSIS — Z89412 Acquired absence of left great toe: Secondary | ICD-10-CM | POA: Diagnosis not present

## 2021-12-01 DIAGNOSIS — E11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: Secondary | ICD-10-CM | POA: Diagnosis not present

## 2021-12-01 DIAGNOSIS — M869 Osteomyelitis, unspecified: Secondary | ICD-10-CM | POA: Diagnosis not present

## 2021-12-01 DIAGNOSIS — L97512 Non-pressure chronic ulcer of other part of right foot with fat layer exposed: Secondary | ICD-10-CM | POA: Diagnosis not present

## 2021-12-01 DIAGNOSIS — D638 Anemia in other chronic diseases classified elsewhere: Secondary | ICD-10-CM | POA: Diagnosis not present

## 2021-12-01 DIAGNOSIS — I10 Essential (primary) hypertension: Secondary | ICD-10-CM | POA: Diagnosis not present

## 2021-12-01 DIAGNOSIS — T8131XD Disruption of external operation (surgical) wound, not elsewhere classified, subsequent encounter: Secondary | ICD-10-CM | POA: Diagnosis not present

## 2021-12-01 DIAGNOSIS — I70202 Unspecified atherosclerosis of native arteries of extremities, left leg: Secondary | ICD-10-CM | POA: Diagnosis not present

## 2021-12-01 DIAGNOSIS — L97514 Non-pressure chronic ulcer of other part of right foot with necrosis of bone: Secondary | ICD-10-CM | POA: Diagnosis not present

## 2021-12-01 LAB — GLUCOSE, CAPILLARY
Glucose-Capillary: 183 mg/dL — ABNORMAL HIGH (ref 70–99)
Glucose-Capillary: 147 mg/dL — ABNORMAL HIGH (ref 70–99)

## 2021-12-01 NOTE — Progress Notes (Addendum)
[name], [name] ([id number]) ?Visit Report for 12/01/2021 ?Arrival Information Details ?Patient Name: Date of Service: ?[name], [name] 12/01/2021 9:30 A M ?Medical Record Number: [id number] ?Patient Account Number: [id number] ?Date of Birth/Sex: Treating RN: ?Feb 10, 1941 (81 y.o. [name], [name] ?Primary Care Desi Rowe: [name] ?Other Clinician: [name] ?Referring Hermen Mario: ?Treating Kedar Sedano/[name]: [name] ?[name] ?Weeks in Treatment: 24 ?Visit Information History Since Last Visit ?All ordered tests and consults were completed: Yes ?Patient Arrived: Ambulatory ?Added or deleted any medications: No ?Arrival Time: 09:25 ?Any new allergies or adverse reactions: No ?Accompanied By: None ?Had a fall or experienced change in No ?Transfer Assistance: None ?activities of daily living that may affect ?Patient Identification Verified: Yes ?risk of falls: ?Secondary Verification Process Completed: Yes ?Signs or symptoms of abuse/neglect since last visito No ?Patient Requires Transmission-Based Precautions: No ?Hospitalized since last visit: No ?Patient Has Alerts: Yes ?Implantable device outside of the clinic excluding No ?Patient Alerts: Patient on Blood Thinner cellular tissue based products placed in the center ?since last visit: ?Pain Present Now: No ?Notes ?Paper version was used prior to the treatment. ?Electronic Signature(s) ?Signed: 12/01/2021 10:33:55 AM By: [name] EMT ?Entered By: [name] on 12/01/2021 10:33:55 ?-------------------------------------------------------------------------------- ?Encounter Discharge Information Details ?Patient Name: Date of Service: ?[name], [name] 12/01/2021 9:30 A M ?Medical Record Number: [id number] ?Patient Account Number: [id number] ?Date of Birth/Sex: Treating RN: ?[birth year] (81 y.o. [name], [name] ?Primary Care Munachimso Rigdon: [name] ?Other Clinician: [name] ?Referring Bralen Wiltgen: ?Treating  Karita Dralle/[name]: [name] ?[name] ?Weeks in Treatment: 24 ?Encounter Discharge Information Items ?Discharge Condition: Stable ?Ambulatory Status: Ambulatory ?Discharge Destination: Home ?Transportation: Private Auto ?Accompanied By: None ?Schedule Follow-up Appointment: Yes ?Clinical Summary of Care: ?Electronic Signature(s) ?Signed: 12/01/2021 12:04:36 PM By: [name] EMT ?Entered By: [name] on 12/01/2021 12:04:36 ?-------------------------------------------------------------------------------- ?Patient/Caregiver Education Details ?Patient Name: ?Date of Service: ?[name], [name] 3/10/2023andnbsp9:30 A M ?Medical Record Number: [id number] ?Patient Account Number: [id number] ?Date of Birth/Gender: ?Treating RN: ?1940-12-18 (81 y.o. [name], [name] ?Primary Care Physician: [name] ?Other Clinician: [name] ?Referring Physician: ?Treating Physician/[name]: [name] ?[name] ?Weeks in Treatment: 24 ?Education Assessment ?Education Provided To: ?Patient ?Education Topics Provided ?Electronic Signature(s) ?Signed: 12/01/2021 12:42:56 PM By: [name] EMT ?Entered By: [name] on 12/01/2021 12:03:13 ?-------------------------------------------------------------------------------- ?Vitals Details ?Patient Name: ?Date of Service: ?[name], [name] 12/01/2021 9:30 A M ?Medical Record Number: [id number] ?Patient Account Number: [id number] ?Date of Birth/Sex: ?Treating RN: ?[birth year] (81 y.o. [name], [name] ?Primary Care Tawan Corkern: [name] ?Other Clinician: [name] ?Referring Fatime Biswell: ?Treating Leonette Tischer/[name]: [name] ?[name] ?Weeks in Treatment: 24 ?Vital Signs ?Time Taken: 09:47 ?Temperature (??F): 97.6 ?Height (in): 64 ?Pulse (bpm): 78 ?Weight (lbs): 135 ?Respiratory Rate (breaths/min): 16 ?Body Mass Index (BMI): 23.2 ?Blood Pressure (mmHg): 132/54 ?Capillary Blood Glucose (mg/dl):  183 ?Reference Range: 80 - 120 mg / dl ?Notes ?Paper version was used prior to the treatment. The patient was asked if she had any problems with her today before treatment started. She stated no. ?Electronic Signature(s) ?Signed: 12/01/2021 10:34:56 AM By: [name] EMT ?Entered By: [name] on 12/01/2021 10:34:56 ?

## 2021-12-01 NOTE — Progress Notes (Addendum)
JIMI, GIZA ([id number]) ?Visit Report for 12/01/2021 ?HBO Details ?Patient Name: Date of Service: ?[name], [name] 12/01/2021 9:30 A M ?Medical Record Number: [id number] ?Patient Account Number: [id number] ?Date of Birth/Sex: Treating RN: ?[birth year] (81 y.o. 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 ?HBO Treatment Details ?Treatment Number: 41 ?Patient Type: Outpatient ?Chamber Type: Monoplace ?Chamber Serial #: [id number] ?Treatment Protocol: 2.0 ATA with 90 minutes oxygen, and no air breaks ?Treatment Details ?Compression Rate Down: 2.0 psi / minute ?De-Compression Rate Up: 2.0 psi / minute ?Air ?breaks ?and ?breathing Decompress Decompress ?Compress Tx Pressure ?Begins Reached periods Begins Ends ?(leave ?unused ?spaces ?blank) ?Chamber Pressure (ATA 1 2 ------2 1 ?) ?Clock Time (24 hr) 09:57 10:05 - - - - - - 11:35 11:46 ?Treatment Length: 109 (minutes) ?Treatment Segments: 4 ?Vital Signs ?Capillary Blood Glucose Reference Range: 80 - 120 mg / dl ?HBO Diabetic Blood Glucose Intervention Range: <131 mg/dl or >249 mg/dl ?Time Vitals Blood Respiratory Capillary Blood Glucose Pulse Action ?Type: ?Pulse: Temperature: ?Taken: ?Pressure: ?Rate: ?Glucose (mg/dl): ?Meter #: Oximetry (%) Taken: ?Pre 09:47 132/54 78 16 97.6 183 ?Post 11:53 127/56 73 16 97.8 147 ?Treatment Response ?Treatment Toleration: Well ?Treatment Completion Status: Treatment Completed without Adverse Event ?Treatment Notes ?The patient did  well today with her treatment. ?Additional Procedure Documentation ?Tissue Sevierity: Fat layer exposed ?Physician HBO Attestation: ?I certify that I supervised this HBO treatment in accordance with Medicare ?guidelines. A trained emergency response team is readily available per Yes ?hospital policies and procedures. ?Continue HBOT as ordered. Yes ?Electronic Signature(s) ?Signed: 12/04/2021 7:32:14 AM By: [name] MD FACS ?Previous Signature: 12/01/2021 12:02:06 PM Version By: [name] EMT ?Entered By: [name] on 12/04/2021 07:32:14 ?-------------------------------------------------------------------------------- ?HBO Safety Checklist Details ?Patient Name: ?Date of Service: ?[name], [name] 12/01/2021 9:30 A M ?Medical Record Number: [id number] ?Patient Account Number: [id number] ?Date of Birth/Sex: ?Treating RN: ?31-Oct-1940 (81 y.o. [name], [name] ?Primary Care Gurpreet Mariani: [name] ?Other Clinician: [name] ?Referring Chelsae Zanella: ?Treating Raahil Ong/[name]: [name] ?[name] ?Weeks in Treatment: 24 ?HBO Safety Checklist Items ?Safety Checklist ?Consent Form Signed ?Patient voided / foley secured and emptied ?When did you last eato 0730 ?Last dose of injectable or oral agent 0740 ?Ostomy pouch emptied and vented if applicable ?NA ?All implantable devices assessed, documented and approved ?NA ?Intravenous access site secured and place ?Valuables secured ?Linens and cotton and cotton/polyester blend (less than 51% polyester) ?Personal oil-based products / skin lotions / body lotions removed ?Wigs or hairpieces removed ?Smoking or tobacco materials removed ?Books / newspapers / magazines / loose paper removed ?Cologne, aftershave, perfume and deodorant removed ?Jewelry removed (may wrap wedding band) ?Make-up removed ?Hair care products removed ?Battery operated devices (external) removed ?Heating patches and chemical warmers removed ?Titanium eyewear  removed ?NA ?Nail polish cured greater than 10 hours ?NA ?Casting material cured greater than 10 hours ?NA ?Hearing aids removed ?NA ?Loose dentures or partials removed ?NA ?Prosthetics have been removed ?NA ?Patient demonstrates correct use of air break device (if applicable) ?Patient concerns have been addressed ?Patient grounding bracelet on and cord attached to chamber ?  Specifics for Inpatients (complete in addition to above) ?Medication sheet sent with patient ?NA ?Intravenous medications needed or due during therapy sent with patient ?NA ?Drainage tubes (e.g. nasogastric tube or chest tube secured and vented) ?NA ?Endotracheal or Tracheotomy tube secured ?NA ?Cuff deflated of air and inflated with saline ?NA ?Airway suctioned ?NA ?Notes ?Paper version used prior to treatment. ?Electronic Signature(s) ?Signed: 12/01/2021 10:46:49 AM By: [name] EMT ?Entered By: [name] on 12/01/2021 10:46:49 ?

## 2021-12-04 ENCOUNTER — Other Ambulatory Visit: Payer: Self-pay

## 2021-12-04 ENCOUNTER — Encounter (HOSPITAL_BASED_OUTPATIENT_CLINIC_OR_DEPARTMENT_OTHER): Payer: Medicare Other | Admitting: General Surgery

## 2021-12-04 DIAGNOSIS — L97514 Non-pressure chronic ulcer of other part of right foot with necrosis of bone: Secondary | ICD-10-CM | POA: Diagnosis not present

## 2021-12-04 DIAGNOSIS — Z4781 Encounter for orthopedic aftercare following surgical amputation: Secondary | ICD-10-CM | POA: Diagnosis not present

## 2021-12-04 DIAGNOSIS — T8131XD Disruption of external operation (surgical) wound, not elsewhere classified, subsequent encounter: Secondary | ICD-10-CM | POA: Diagnosis not present

## 2021-12-04 DIAGNOSIS — I70202 Unspecified atherosclerosis of native arteries of extremities, left leg: Secondary | ICD-10-CM | POA: Diagnosis not present

## 2021-12-04 DIAGNOSIS — E11621 Type 2 diabetes mellitus with foot ulcer: Secondary | ICD-10-CM | POA: Diagnosis not present

## 2021-12-04 DIAGNOSIS — L97512 Non-pressure chronic ulcer of other part of right foot with fat layer exposed: Secondary | ICD-10-CM | POA: Diagnosis not present

## 2021-12-04 DIAGNOSIS — E11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: Secondary | ICD-10-CM | POA: Diagnosis not present

## 2021-12-04 DIAGNOSIS — D638 Anemia in other chronic diseases classified elsewhere: Secondary | ICD-10-CM | POA: Diagnosis not present

## 2021-12-04 DIAGNOSIS — M86671 Other chronic osteomyelitis, right ankle and foot: Secondary | ICD-10-CM | POA: Diagnosis not present

## 2021-12-04 DIAGNOSIS — Z89412 Acquired absence of left great toe: Secondary | ICD-10-CM | POA: Diagnosis not present

## 2021-12-04 DIAGNOSIS — E11628 Type 2 diabetes mellitus with other skin complications: Secondary | ICD-10-CM | POA: Diagnosis not present

## 2021-12-04 DIAGNOSIS — I10 Essential (primary) hypertension: Secondary | ICD-10-CM | POA: Diagnosis not present

## 2021-12-04 LAB — GLUCOSE, CAPILLARY
Glucose-Capillary: 141 mg/dL — ABNORMAL HIGH (ref 70–99)
Glucose-Capillary: 179 mg/dL — ABNORMAL HIGH (ref 70–99)

## 2021-12-04 NOTE — Progress Notes (Signed)
EMRYN, FLANERY ([id number]) ?Visit Report for 12/01/2021 ?Problem List Details ?Patient Name: Date of Service: ?[name], [name] 12/01/2021 9:30 A M ?Medical Record Number: [id number] ?Patient Account Number: [id number] ?Date of Birth/Sex: Treating RN: ?1940/09/29 (81 y.o. [name], [name] ?Primary Care Provider: [name] ?Other Clinician: [name] ?Referring Provider: ?Treating Provider/[name]: [name] ?[name] ?Weeks in Treatment: 24 ?Active Problems ?ICD-10 ?Encounter ?Code Description Active Date MDM ?Diagnosis ?E11.621 Type 2 diabetes mellitus with foot ulcer 06/15/2021 No Yes ?T81.31XD Disruption of external operation (surgical) wound, not elsewhere classified, 06/15/2021 No Yes ?subsequent encounter ?L97.514 Non-pressure chronic ulcer of other part of right foot with necrosis of bone 09/20/2021 No Yes ?E11.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 06/15/2021 No Yes ?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, right ankle and foot 10/25/2021 No Yes ?Inactive Problems ?ICD-10 ?Code Description Active Date Inactive Date ?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 12/01/2021 ?Medical Record Number: [id number] ?Patient Account Number: [id number] ?Date of Birth/Sex: ?Treating RN: ?1941-04-29 (81 y.o. [name], [name] ?Primary Care Provider: [name] ?Other Clinician: [name] ?Referring  Provider: ?Treating Provider/[name]: [name] ?[name] ?Weeks in Treatment: 24 ?Diagnosis Coding ?ICD-10 Codes ?Code Description ?E11.621 Type 2 diabetes mellitus with foot ulcer ?T81.31XD Disruption of external operation (surgical) wound, not elsewhere classified, subsequent encounter ?L97.514 Non-pressure chronic ulcer of other part of right foot with necrosis of bone ?E11.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 ?Y64.158 Other chronic osteomyelitis, right ankle and foot ?Facility Procedures ?CPT4 Code: 30940768 ?Description: G0277-(Facility Use Only) HBOT full body chamber, 82mn , ICD-10 Diagnosis Description 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 Disruption of external operation (surgical) wound, not elsewhere classified, subsequent encounter 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, right ankle and foot ?Quantity: 1 ?Electronic Signature(s) ?Signed: 12/04/2021 7:32:24 AM By: CFredirick MaudlinMD FACS ?Previous Signature: 12/01/2021 12:02:42 PM Version By: GValeria BatmanEMT ?Entered By: CFredirick Maudlinon 12/04/2021 07:32:23 ?

## 2021-12-04 NOTE — Progress Notes (Signed)
LANYAH, SPENGLER ([id number]) ?Visit Report for 11/30/2021 ?Arrival Information Details ?Patient Name: Date of Service: ?[name], [name] 11/30/2021 1:00 PM ?Medical Record Number: [id number] ?Patient Account Number: [id number] ?Date of Birth/Sex: Treating RN: ?1941-01-27 (81 y.o. F) [name], [name] ?Primary Care Garnett Nunziata: [name] ?Other Clinician: [name] ?Referring Angelik Walls: ?Treating Dyanara Cozza/[name]: [name] ?[name] ?Weeks in Treatment: 24 ?Visit Information History Since Last Visit ?All ordered tests and consults were completed: Yes ?Patient Arrived: Ambulatory ?Added or deleted any medications: No ?Arrival Time: 12:42 ?Any new allergies or adverse reactions: No ?Accompanied By: self ?Had a fall or experienced change in No ?Transfer Assistance: None ?activities of daily living that may affect ?Patient Identification Verified: Yes ?risk of falls: ?Secondary Verification Process Completed: Yes ?Signs or symptoms of abuse/neglect since last visito No ?Patient Requires Transmission-Based Precautions: No ?Hospitalized since last visit: No ?Patient Has Alerts: Yes ?Implantable device outside of the clinic excluding No ?Patient Alerts: Patient on Blood Thinner cellular tissue based products placed in the center ?since last visit: ?Pain Present Now: No ?Electronic Signature(s) ?Signed: 12/04/2021 12:11:07 PM By: [name] CHT EMT BS ?, , ?Entered By: [name] on 11/30/2021 13:23:40 ?-------------------------------------------------------------------------------- ?Encounter Discharge Information Details ?Patient Name: Date of Service: ?[name], [name] 11/30/2021 1:00 PM ?Medical Record Number: [id number] ?Patient Account Number: [id number] ?Date of Birth/Sex: Treating RN: ?06-13-1941 (81 y.o. F) [name], [name] ?Primary Care Alik Mawson: [name] ?Other Clinician: [name] ?Referring Raquell Richer: ?Treating Jachob Mcclean/[name]: [name] ?[name] ?Weeks in Treatment: 24 ?Encounter Discharge Information Items ?Discharge Condition: Stable ?Ambulatory Status: Ambulatory ?Discharge Destination: Home ?Transportation: Private Auto ?Accompanied By: self ?Schedule Follow-up Appointment: No ?Clinical Summary of Care: ?Electronic Signature(s) ?Signed: 12/04/2021 12:11:07 PM By: [name] CHT EMT BS ?, , ?Entered By: [name] on 11/30/2021 15:14:41 ?-------------------------------------------------------------------------------- ?Vitals Details ?Patient Name: ?Date of Service: ?[name], [name] 11/30/2021 1:00 PM ?Medical Record Number: [id number] ?Patient Account Number: [id number] ?Date of Birth/Sex: ?Treating RN: ?08-Jan-1941 (81 y.o. F) [name], [name] ?Primary Care Marcha Licklider: [name] ?Other Clinician: [name] ?Referring Geralda Baumgardner: ?Treating Audon Heymann/[name]: [name] ?[name] ?Weeks in Treatment: 24 ?Vital Signs ?Time Taken: 12:58 ?Temperature (??F): 97.6 ?Height (in): 64 ?Pulse (bpm): 73 ?Weight (lbs): 135 ?Respiratory Rate (breaths/min): 16 ?Body Mass Index (BMI): 23.2 ?Blood Pressure (mmHg): 138/62 ?Reference Range: 80 - 120 mg / dl ?Airway ?Pulse Oximetry (%): 100 ?Electronic Signature(s) ?Signed: 12/04/2021 12:11:07 PM By: [name] CHT EMT BS ?, , ?Entered By: [name] on 11/30/2021 13:24:48 ?

## 2021-12-04 NOTE — Progress Notes (Signed)
ARCOLA, FRESHOUR ([id number]) ?Visit Report for 12/04/2021 ?Problem List Details ?Patient Name: Date of Service: ?[name], [name] 12/04/2021 12:30 PM ?Medical Record Number: [id number] ?Patient Account Number: [id number] ?Date of Birth/Sex: Treating RN: ?03/08/1941 (81 y.o. 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 06/15/2021 No Yes ?J88.416 Other chronic osteomyelitis, right ankle and foot 10/25/2021 No Yes ?Inactive Problems ?ICD-10 ?Code Description Active Date Inactive Date ?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 12/04/2021 ?Medical Record Number: [id number] ?Patient Account Number: [id number] ?Date of Birth/Sex: ?Treating RN: ?17-Oct-1940 (81 y.o. 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 ?U93.235 Other chronic osteomyelitis, right ankle and foot ?Facility Procedures ?CPT4 Code: 57322025 ?Description: G0277-(Facility Use Only) HBOT full body chamber, 40mn , ICD-10 Diagnosis Description 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 M86.671 Other  ?chronic osteomyelitis, right ankle and foot T81.31XD Disruption of external operation (surgical) wound, not elsewhere classified, subse ?Modifier: quent encounter ?Quantity: 4 ?Physician Procedures ?: CPT4 Code Description Modifier [id number] 76283- WC PHYS HYPERBARIC OXYGEN THERAPY ICD-10 Diagnosis Description 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

## 2021-12-05 ENCOUNTER — Other Ambulatory Visit: Payer: Self-pay

## 2021-12-05 ENCOUNTER — Encounter (HOSPITAL_BASED_OUTPATIENT_CLINIC_OR_DEPARTMENT_OTHER): Payer: Medicare Other | Admitting: General Surgery

## 2021-12-05 ENCOUNTER — Ambulatory Visit (INDEPENDENT_AMBULATORY_CARE_PROVIDER_SITE_OTHER): Payer: Medicare Other | Admitting: Endocrinology

## 2021-12-05 ENCOUNTER — Encounter: Payer: Self-pay | Admitting: Endocrinology

## 2021-12-05 VITALS — BP 138/70 | HR 55 | Ht 65.0 in | Wt 148.6 lb

## 2021-12-05 DIAGNOSIS — E1165 Type 2 diabetes mellitus with hyperglycemia: Secondary | ICD-10-CM

## 2021-12-05 DIAGNOSIS — E11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: Secondary | ICD-10-CM | POA: Diagnosis not present

## 2021-12-05 DIAGNOSIS — I10 Essential (primary) hypertension: Secondary | ICD-10-CM | POA: Diagnosis not present

## 2021-12-05 DIAGNOSIS — L97512 Non-pressure chronic ulcer of other part of right foot with fat layer exposed: Secondary | ICD-10-CM | POA: Diagnosis not present

## 2021-12-05 DIAGNOSIS — L97514 Non-pressure chronic ulcer of other part of right foot with necrosis of bone: Secondary | ICD-10-CM | POA: Diagnosis not present

## 2021-12-05 DIAGNOSIS — E11621 Type 2 diabetes mellitus with foot ulcer: Secondary | ICD-10-CM | POA: Diagnosis not present

## 2021-12-05 DIAGNOSIS — T8131XD Disruption of external operation (surgical) wound, not elsewhere classified, subsequent encounter: Secondary | ICD-10-CM | POA: Diagnosis not present

## 2021-12-05 DIAGNOSIS — I6523 Occlusion and stenosis of bilateral carotid arteries: Secondary | ICD-10-CM

## 2021-12-05 DIAGNOSIS — M86671 Other chronic osteomyelitis, right ankle and foot: Secondary | ICD-10-CM | POA: Diagnosis not present

## 2021-12-05 DIAGNOSIS — I6529 Occlusion and stenosis of unspecified carotid artery: Secondary | ICD-10-CM

## 2021-12-05 LAB — GLUCOSE, CAPILLARY
Glucose-Capillary: 187 mg/dL — ABNORMAL HIGH (ref 70–99)
Glucose-Capillary: 180 mg/dL — ABNORMAL HIGH (ref 70–99)

## 2021-12-05 NOTE — Progress Notes (Addendum)
LEANNE, SISLER ([id number]) ?Visit Report for 12/05/2021 ?Arrival Information Details ?Patient Name: Date of Service: ?[name], [name] 12/05/2021 1:00 PM ?Medical Record Number: [id number] ?Patient Account Number: [id number] ?Date of Birth/Sex: Treating RN: ?May 28, 1941 (81 y.o. [name], [name] ?Primary Care Rondi Ivy: [name] ?Other Clinician: [name] ?Referring Saadiq Poche: ?Treating Marquon Alcala/[name]: [name] ?[name] ?Weeks in Treatment: 24 ?Visit Information History Since Last Visit ?All ordered tests and consults were completed: Yes ?Patient Arrived: Ambulatory ?Added or deleted any medications: No ?Arrival Time: 12:48 ?Any new allergies or adverse reactions: No ?Accompanied By: None ?Had a fall or experienced change in No ?Transfer Assistance: None ?activities of daily living that may affect ?Patient Requires Transmission-Based Precautions: No ?risk of falls: ?Patient Has Alerts: Yes ?Signs or symptoms of abuse/neglect since last visito No ?Patient Alerts: Patient on Blood Thinner ?Hospitalized since last visit: No ?Implantable device outside of the clinic excluding No ?cellular tissue based products placed in the center ?since last visit: ?Pain Present Now: No ?Notes ?Paper version used prior to the treatment. ?Electronic Signature(s) ?Signed: 12/05/2021 1:22:25 PM By: [name] EMT ?Entered By: [name] on 12/05/2021 13:22:24 ?-------------------------------------------------------------------------------- ?Encounter Discharge Information Details ?Patient Name: Date of Service: ?[name], [name] 12/05/2021 1:00 PM ?Medical Record Number: [id number] ?Patient Account Number: [id number] ?Date of Birth/Sex: Treating RN: ?03-15-1941 (81 y.o. [name], [name] ?Primary Care Jung Ingerson: [name] ?Other Clinician: [name] ?Referring Kristeen Lantz: ?Treating Alexi Dorminey/[name]: [name] ?[name] ?Weeks in Treatment: 24 ?Encounter Discharge  Information Items ?Discharge Condition: Stable ?Ambulatory Status: [name] ?Discharge Destination: Home ?Transportation: Private Auto ?Accompanied By: None ?Schedule Follow-up Appointment: Yes ?Clinical Summary of Care: ?Electronic Signature(s) ?Signed: 12/05/2021 3:28:55 PM By: [name] EMT ?Entered By: [name] on 12/05/2021 15:28:55 ?-------------------------------------------------------------------------------- ?Patient/Caregiver Education Details ?Patient Name: ?Date of Service: ?[name], [name] 3/14/2023andnbsp1:00 PM ?Medical Record Number: [id number] ?Patient Account Number: [id number] ?Date of Birth/Gender: ?Treating RN: ?May 10, 1941 (81 y.o. [name], [name] ?Primary Care Physician: [name] ?Other Clinician: [name] ?Referring Physician: ?Treating Physician/[name]: [name] ?[name] ?Weeks in Treatment: 24 ?Education Assessment ?Education Provided To: ?Patient ?Education Topics Provided ?Electronic Signature(s) ?Signed: 12/05/2021 6:12:46 PM By: [name] EMT ?Entered By: [name] on 12/05/2021 15:28:31 ?-------------------------------------------------------------------------------- ?Vitals Details ?Patient Name: ?Date of Service: ?[name], [name] 12/05/2021 1:00 PM ?Medical Record Number: [id number] ?Patient Account Number: [id number] ?Date of Birth/Sex: ?Treating RN: ?11-05-1940 (81 y.o. [name], [name] ?Primary Care Deniqua Perry: [name] ?Other Clinician: [name] ?Referring Janetta Vandoren: ?Treating Reeshemah Nazaryan/[name]: [name] ?[name] ?Weeks in Treatment: 24 ?Vital Signs ?Time Taken: 12:57 ?Temperature (??F): 97.5 ?Height (in): 64 ?Pulse (bpm): 87 ?Weight (lbs): 135 ?Respiratory Rate (breaths/min): 16 ?Body Mass Index (BMI): 23.2 ?Blood Pressure (mmHg): 162/58 ?Capillary Blood Glucose (mg/dl): 187 ?Reference Range: 80 - 120 mg / dl ?Notes ?Paper version used prior to the treatment. ?Electronic Signature(s) ?Signed:  12/05/2021 1:24:12 PM By: [name] EMT ?Entered By: [name] on 12/05/2021 13:24:12 ?

## 2021-12-05 NOTE — Progress Notes (Signed)
Patient ID: [name], female   DOB: 05-28-1941, 81 y.o.   MRN: [id number] ?       ? ? ?Reason for Appointment: Follow-up for endocrinology problems ? ? ?History of Present Illness:  ?        ?Date of diagnosis of type 2 diabetes mellitus: 1984      ? ?Background history:  ? ?She has been on metformin since about the time of her diagnosis and has been on other drugs also but has not had many changes in her regimen in the last several years ?She thinks her blood sugar used to be fairly well controlled ?At some point she was given Byetta for about a year and a half which improved her control but this was subsequently start because of it being too expensive for her.  She had no side effects with this ? ?Recent history:  ? ?Her A1c is last 7.6  ?Fructosamine is 261 ? ?Non-insulin hypoglycemic regimen: Amaryl 2 mg in a.m., Jardiance 25 mg daily,   metformin 1 g daily, Prandin 1 mg before breakfast ? ?Current management, level of control, blood sugar patterns and problems identified: ? ?Fructosamine previously 237, now 275 ? ?Her blood sugars are relatively better after breakfast with adding Prandin in the morning  ?Although she is checking her blood sugars very infrequently after dinner they appear to be mostly over 180 when checked  ?This is without any change in her diet overall  ?Previously was taking Invokana which was changed because of hyperkalemia and she is taking Jardiance along with her Amaryl and Prandin in the morning  ?She is asking about how soon before breakfast she should take her medications  ?She is still trying to get some protein with breakfast ?As before she is not able to do any exercise because of her foot infection ?She has had some foot infection and is getting in the hyperbaric chamber for this ?With this her blood sugar may sometimes get lower and once was in the 60s, she usually goes for her treatment in the afternoon after lunch ? ?      ?Side effects from medications have been:  None ? ?Compliance with the medical regimen: Good ?Hypoglycemia:   None ? ?Glucose monitoring:  About 1 times a day         Glucometer: One Touch. ?      ?Blood Glucose data: ?  ? ?PRE-MEAL Fasting Lunch Dinner Bedtime Overall  ?Glucose range: 95-174    84-208  ?Mean/median: 130    137  ? ?POST-MEAL 11 AM-2 PM PC Lunch PC Dinner  ?Glucose range: 95-173 101-175 181-208  ?Mean/median:  136 190  ? ?Previously: ? ?PRE-MEAL Fasting Lunch Dinner Bedtime Overall  ?Glucose range: 68-166 172-215  129 68-215  ?Mean/median: 102    128  ? ?POST-MEAL PC Breakfast PC Lunch PC Dinner  ?Glucose range:     ?Mean/median:     ? ?Previous data: ? ?PRE-MEAL Fasting Lunch Dinner Bedtime Overall  ?Glucose range: 63-1 05    63-192  ?Mean/median: 88    118  ? ?POST-MEAL PC Breakfast PC Lunch PC Dinner  ?Glucose range:   191  ?Mean/median:     ? ?  ?Self-care: The diet that the patient has been following is: tries to limit sweets and fried food.     ?Meal times are:  Breakfast is at 8:30 AM lunch: 1 PM, dinner: 6:30 PM ? ?Typical meal intake: Breakfast is yogurt/cereal with fruit or egg/toast  or oatmeal ?Sometimes drinking boost ? ?Lunch is usually soup with crackers, dinner is usually a low-fat meat with vegetables ?Snacks will be celery and ranch dressing ?              ?Dietician visit, most recent: 10/18 ?              ? ?Weight history: ? ?Wt Readings from Last 3 Encounters:  ?12/05/21 148 lb 9.6 oz (67.4 kg)  ?11/27/21 152 lb (68.9 kg)  ?11/14/21 150 lb (68 kg)  ? ? ?Glycemic control: ?  ?Lab Results  ?Component Value Date  ? HGBA1C 7.6 (H) 09/29/2021  ? HGBA1C 5.8 06/28/2021  ? HGBA1C 7.2 (H) 04/27/2021  ? ?Lab Results  ?Component Value Date  ? MICROALBUR 3.8 (H) 09/29/2021  ? Knierim 50 10/14/2021  ? CREATININE 1.02 11/28/2021  ? ?Lab Results  ?Component Value Date  ? MICRALBCREAT 16.3 09/29/2021  ? ? ?Lab Results  ?Component Value Date  ? FRUCTOSAMINE 261 11/28/2021  ? FRUCTOSAMINE 275 09/29/2021  ? FRUCTOSAMINE 237 03/29/2021   ? ?Lab Results  ?Component Value Date  ? HGB 10.4 (L) 10/27/2021  ? ? ? ? ?Allergies as of 12/05/2021   ? ?   Reactions  ? Augmentin [amoxicillin-pot Clavulanate] Other (See Comments)  ? Elevates potassium level  ? Bactrim [sulfamethoxazole-trimethoprim] Other (See Comments)  ? ^ K+( elevated)   ? Demerol [meperidine]   ? Delusional   ? Scopolamine   ? Delusional   ? Tramadol Other (See Comments)  ? Keeps awake  ? Versed [midazolam] Anxiety  ? Frantic, out of my mind, agitated   ? ?  ? ?  ?Medication List  ?  ? ?  ? Accurate as of December 05, 2021 11:53 AM. If you have any questions, ask your nurse or doctor.  ?  ?  ? ?  ? ?acetaminophen 500 MG tablet ?Commonly known as: TYLENOL ?Take 1,000 mg by mouth every 6 (six) hours as needed for moderate pain or headache. ?  ?aspirin EC 81 MG tablet ?Take 81 mg by mouth at bedtime. ?  ?atorvastatin 80 MG tablet ?Commonly known as: LIPITOR ?TAKE 1 TABLET BY MOUTH ONCE DAILY. PATIENT  MUST  KEEP  UPCOMING  APPOINTMENT  FOR  FUTURE  REFILLS ?  ?brimonidine 0.2 % ophthalmic solution ?Commonly known as: ALPHAGAN ?Place 1 drop into the right eye 2 (two) times daily. ?  ?CINNAMON PO ?Take 2 tablets by mouth in the morning, at noon, and at bedtime. ?  ?[facility] SENIOR PROBIOTIC PO ?Take 1 capsule by mouth every evening. ?  ?empagliflozin 25 MG Tabs tablet ?Commonly known as: Jardiance ?Take 1 tablet (25 mg total) by mouth daily before breakfast. ?  ?Ferocon capsule ?Generic drug: ferrous [id number] C-folic acid ?Take 1 capsule by mouth 2 (two) times daily. ?  ?fluconazole 150 MG tablet ?Commonly known as: DIFLUCAN ?Take 1 tablet (150 mg total) by mouth daily. Can take 1 tab every 3 days if still having symptoms ?  ?glimepiride 2 MG tablet ?Commonly known as: AMARYL ?TAKE 1 TABLET BY MOUTH ONCE DAILY BEFORE BREAKFAST ?  ?hydrochlorothiazide 12.5 MG tablet ?Commonly known as: HYDRODIURIL ?Take 1 tablet by mouth once daily ?  ?metFORMIN 1000 MG tablet ?Commonly known as:  GLUCOPHAGE ?Take 1 tablet by mouth twice daily ?What changed: when to take this ?  ?metoprolol tartrate 25 MG tablet ?Commonly known as: LOPRESSOR ?TAKE 1 & 1/2 (ONE & ONE-HALF) TABLETS BY MOUTH TWICE DAILY ?  ?  NON FORMULARY ?Prisma topical ointment for right foot ?  ?OneTouch Delica Plus [id number] Misc ?Use to check blood sugars twice daily. ?  ?OneTouch Verio test strip ?Generic drug: glucose blood ?Use to check blood sugars once daily ?  ?pantoprazole 40 MG tablet ?Commonly known as: PROTONIX ?Take 1 tablet (40 mg total) by mouth daily. ?What changed: when to take this ?  ?pyridoxine 100 MG tablet ?Commonly known as: B-6 ?Take 100 mg by mouth every evening. ?  ?repaglinide 1 MG tablet ?Commonly known as: Prandin ?Take 1 tablet (1 mg total) by mouth daily before breakfast. ?  ?Systane Balance 0.6 % Soln ?Generic drug: Propylene Glycol ?Place 1 drop into the left eye 3 (three) times daily as needed (dry/irritated eyes). ?  ?telmisartan 80 MG tablet ?Commonly known as: MICARDIS ?Take 1 tablet by mouth once daily ?  ?Xarelto 2.5 MG Tabs tablet ?Generic drug: rivaroxaban ?Take 1 tablet by mouth twice daily ?  ? ?  ? ? ?Allergies:  ?Allergies  ?Allergen Reactions  ? Augmentin [Amoxicillin-Pot Clavulanate] Other (See Comments)  ?  Elevates potassium level  ? Bactrim [Sulfamethoxazole-Trimethoprim] Other (See Comments)  ?  ^ K+( elevated)   ? Demerol [Meperidine]   ?  Delusional   ? Scopolamine   ?  Delusional   ? Tramadol Other (See Comments)  ?  Keeps awake  ? Versed [Midazolam] Anxiety  ?  Frantic, out of my mind, agitated   ? ? ?Past Medical History:  ?Diagnosis Date  ? Anemia   ? 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 Arthritis   ? Cancer [facility])   ? 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 Complication of anesthesia   ? VERSED- agitated, muscle spasms, "jerking" , frantic , (never had this occurence in the pas)   ? Coronary artery disease   ? Diabetes mellitus without complication ([place])   ? Dysrhythmia   ? PVC's  ? GERD  (gastroesophageal reflux disease)   ? Heart murmur   ? History of hiatal hernia   ? Hyperlipidemia   ? Hypertension 11/20/11  ? ECHO- EF>55% Borderline concentric left ventricular hypertrophy. There is a small calcified mass in the L:A ne

## 2021-12-05 NOTE — Progress Notes (Signed)
[name], [name] ([id number]) ?Visit Report for 12/05/2021 ?Problem List Details ?Patient Name: Date of Service: ?[name], [name] 12/05/2021 1:00 PM ?Medical Record Number: [id number] ?Patient Account Number: [id number] ?Date of Birth/Sex: Treating RN: ?[birth year] (81 y.o. [name], [name] ?Primary Care Provider: [name] ?Other Clinician: [name] ?Referring Provider: ?Treating Provider/[name]: [name] ?[name] ?Weeks in Treatment: 24 ?Active Problems ?ICD-10 ?Encounter ?Code Description Active Date MDM ?Diagnosis ?E11.621 Type 2 diabetes mellitus with foot ulcer 06/15/2021 No Yes ?T81.31XD Disruption of external operation (surgical) wound, not elsewhere classified, 06/15/2021 No Yes ?subsequent encounter ?L97.514 Non-pressure chronic ulcer of other part of right foot with necrosis of bone 09/20/2021 No Yes ?E11.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 06/15/2021 No Yes ?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, right ankle and foot 10/25/2021 No Yes ?Inactive Problems ?ICD-10 ?Code Description Active Date Inactive Date ?L97.428 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 06/15/2021 06/15/2021 ?L97.528 Non-pressure chronic ulcer of other part of left foot with other specified severity 06/15/2021 06/15/2021 ?Resolved Problems ?Electronic Signature(s) ?Signed: 12/05/2021 4:54:26 PM By: [name] MD FACS ?Previous Signature: 12/05/2021 3:28:04 PM Version By: [name] EMT ?Entered By: [name] on 12/05/2021 16:54:26 ?-------------------------------------------------------------------------------- ?SuperBill Details ?Patient Name: ?Date of Service: ?[name], [name] 12/05/2021 ?Medical Record Number: [id number] ?Patient Account Number: [id number] ?Date of Birth/Sex: ?Treating RN: ?[birth year] (81 y.o. [name], [name] ?Primary Care Provider: [name] ?Other Clinician: [name] ?Referring  Provider: ?Treating Provider/[name]: [name] ?[name] ?Weeks in Treatment: 24 ?Diagnosis Coding ?ICD-10 Codes ?Code Description ?E11.621 Type 2 diabetes mellitus with foot ulcer ?T81.31XD Disruption of external operation (surgical) wound, not elsewhere classified, subsequent encounter ?L97.514 Non-pressure chronic ulcer of other part of right foot with necrosis of bone ?E11.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 ?X21.194 Other chronic osteomyelitis, right ankle and foot ?Facility Procedures ?CPT4 Code: 17408144 ?Description: G0277-(Facility Use Only) HBOT full body chamber, 72mn , ICD-10 Diagnosis Description T81.31XD Disruption of external operation (surgical) wound, not elsewhere classified, subse L97.514 Non-pressure chronic ulcer of other part of right foot ? with necrosis of bone 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, right ankle and foot E11.621 Type 2 diabetes mellitus with foot ulcer ?Modifier: quent encounter ?Quantity: 4 ?Physician Procedures ?: CPT4 Code Description Modifier [id number] PHYS HYPERBARIC OXYGEN THERAPY ICD-10 Diagnosis Description T81.31XD Disruption of external operation (surgical) wound, not elsewhere classified, subsequent encounter L97.514 Non-pressure chronic ulcer  ?of other part of right foot with necrosis of bone 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, right ankle and foot E11.621 Type 2 diabetes mellitus with foot ulcer ?Quantity: 1 ?Electronic Signature(s) ?Signed: 12/05/2021 4:54:10 PM By: CFredirick MaudlinMD FACS ?Previous Signature: 12/05/2021 3:27:49 PM Version By: GValeria BatmanEMT ?Entered By: CFredirick Maudlinon 12/05/2021 16:54:10 ?

## 2021-12-05 NOTE — Progress Notes (Signed)
VESTA, WHEELAND ([id number]) ?Visit Report for 12/04/2021 ?HBO Details ?Patient Name: Date of Service: ?[name], [name] 12/04/2021 12:30 PM ?Medical Record Number: [id number] ?Patient Account Number: [id number] ?Date of Birth/Sex: Treating RN: ?May 26, 1941 (81 y.o. 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 ?HBO Treatment Details ?Treatment Number: 18 ?Patient Type: Outpatient ?Chamber Type: Monoplace ?Chamber Serial #: [id number] ?Treatment Protocol: 2.0 ATA with 90 minutes oxygen, and no air breaks ?Treatment Details ?Compression Rate Down: 2.0 psi / minute ?De-Compression Rate Up: 2.0 psi / minute ?Air ?breaks ?and ?breathing Decompress Decompress ?Compress Tx Pressure ?Begins Reached periods Begins Ends ?(leave ?unused ?spaces ?blank) ?Chamber Pressure (ATA 1 2 ------2 1 ?) ?Clock Time (24 hr) 13:10 13:19 - - - - - - 14:50 15:00 ?Treatment Length: 110 (minutes) ?Treatment Segments: 4 ?Vital Signs ?Capillary Blood Glucose Reference Range: 80 - 120 mg / dl ?HBO Diabetic Blood Glucose Intervention Range: <131 mg/dl or >249 mg/dl ?Time Vitals Blood Respiratory Capillary Blood Glucose Pulse Action ?Type: ?Pulse: Temperature: ?Taken: ?Pressure: ?Rate: ?Glucose (mg/dl): ?Meter #: Oximetry (%) Taken: ?Pre 13:02 144/62 80 12 97.9 141 2 ?Post 15:00 152/62 28 12 97.8 179 2 ?Treatment Response ?Treatment Toleration: Well ?Treatment Completion Status: Treatment Completed without Adverse Event ?Treatment Notes ?Patient's  heart rate as measured by the pulse oximeter was 28 bpm. Manual measurement was 60 bpm irregular with a normal beat followed by one to two faint ?or lighter beats. Patient states that she is not experiencing any problematic symptoms. Patient states that she experiences arrhythmia on a normal basis. ?Reported finding to physician. ?Additional Procedure Documentation ?Tissue Sevierity: Fat layer exposed ?Physician HBO Attestation: ?I certify that I supervised this HBO treatment in accordance with Medicare ?guidelines. A trained emergency response team is readily available per Yes ?hospital policies and procedures. ?Continue HBOT as ordered. Yes ?Electronic Signature(s) ?Signed: 12/04/2021 5:15:15 PM By: [name] MD FACS ?Entered By: [name] on 12/04/2021 17:15:15 ?-------------------------------------------------------------------------------- ?HBO Safety Checklist Details ?Patient Name: ?Date of Service: ?[name], [name] 12/04/2021 12:30 PM ?Medical Record Number: [id number] ?Patient Account Number: [id number] ?Date of Birth/Sex: ?Treating RN: ?03/16/1941 (81 y.o. [name], [name] ?Primary Care Jagdeep Ancheta: [name] ?Other Clinician: [name] ?Referring Chastity Noland: ?Treating Cyncere Sontag/[name]: [name] ?[name] ?Weeks in Treatment: 24 ?HBO Safety Checklist Items ?Safety Checklist ?Consent Form Signed ?Patient voided / foley secured and emptied ?When did you last eato 1215 ?Last dose of injectable or oral agent 0830 ?Ostomy pouch emptied and vented if applicable ?NA ?All implantable devices assessed, documented and approved ?NA ?Intravenous access site secured and place ?NA ?Valuables secured ?Linens and cotton and cotton/polyester blend (less than 51% polyester) ?Personal oil-based products / skin lotions / body lotions removed ?Wigs or hairpieces removed Patient removed item. ?Smoking or tobacco materials removed ?NA ?Books / newspapers / magazines / loose paper  removed ?Cologne, aftershave, perfume and deodorant removed ?Jewelry removed (may wrap wedding band) ?Make-up removed ?Hair care products removed ?Battery operated devices (external) removed ?Heating patches and chemical warmers removed ?Titanium eyewear removed ?Nail polish cured greater than 10 hours This past Saturday. ?Casting material cured greater  than 10 hours ?NA ?Hearing aids removed ?NA ?Loose dentures or partials removed ?NA ?Prosthetics have been removed ?NA ?Patient demonstrates correct use of air break device (if applicable) ?Patient concerns have been addressed ?Patient grounding bracelet on and cord attached to chamber ?Specifics for Inpatients (complete in addition to above) ?Medication sheet sent with patient ?NA ?Intravenous medications needed or due during therapy sent with patient ?NA ?Drainage tubes (e.g. nasogastric tube or chest tube secured and vented) ?NA ?Endotracheal or Tracheotomy tube secured ?NA ?Cuff deflated of air and inflated with saline ?NA ?Airway suctioned ?NA ?Notes ?Paper version used prior to treatment. ?Electronic Signature(s) ?Signed: 12/05/2021 10:07:48 AM By: [name] CHT EMT BS ?, , ?Entered By: [name] on 12/04/2021 14:01:48 ?

## 2021-12-05 NOTE — Progress Notes (Signed)
[name], [name] ([id number]) ?Visit Report for 12/04/2021 ?Arrival Information Details ?Patient Name: Date of Service: ?[name], [name] 12/04/2021 12:30 PM ?Medical Record Number: [id number] ?Patient Account Number: [id number] ?Date of Birth/Sex: Treating RN: ?04-Jan-1941 (81 y.o. [name], [name] ?Primary Care [name]: [name] ?Other Clinician: Donavan [name] ?Referring [name]: ?Treating [name]/[name]: [name] ?[name] ?Weeks in Treatment: 24 ?Visit Information History Since Last Visit ?All ordered tests and consults were completed: Yes ?Patient Arrived: [name] ?Added or deleted any medications: No ?Arrival Time: 12:48 ?Any new allergies or adverse reactions: No ?Accompanied By: self ?Had a fall or experienced change in No ?Transfer Assistance: None ?activities of daily living that may affect ?Patient Identification Verified: Yes ?risk of falls: ?Secondary Verification Process Completed: Yes ?Signs or symptoms of abuse/neglect since last visito No ?Patient Requires Transmission-Based Precautions: No ?Hospitalized since last visit: No ?Patient Has Alerts: Yes ?Implantable device outside of the clinic excluding No ?Patient Alerts: Patient on Blood Thinner cellular tissue based products placed in the center ?since last visit: ?Pain Present Now: No ?Electronic Signature(s) ?Signed: 12/05/2021 10:07:48 AM By: [name] CHT EMT BS ?, , ?Entered By: [name] on 12/04/2021 15:56:44 ?-------------------------------------------------------------------------------- ?Encounter Discharge Information Details ?Patient Name: Date of Service: ?[name], [name] 12/04/2021 12:30 PM ?Medical Record Number: [id number] ?Patient Account Number: [id number] ?Date of Birth/Sex: Treating RN: ?1940/10/14 (81 y.o. [name], [name] ?Primary Care [name]: [name] ?Other Clinician: Donavan [name] ?Referring Konor Noren: ?Treating [name]/[name]: [name] ?[name] ?Weeks in Treatment: 24 ?Encounter Discharge Information Items ?Discharge Condition: Stable ?Ambulatory Status: [name] ?Discharge Destination: Home ?Transportation: Private Auto ?Accompanied By: self ?Schedule Follow-up Appointment: No ?Clinical Summary of Care: ?Electronic Signature(s) ?Signed: 12/05/2021 10:07:48 AM By: [name] CHT EMT BS ?, , ?Entered By: [name] on 12/04/2021 15:56:32 ?-------------------------------------------------------------------------------- ?Vitals Details ?Patient Name: ?Date of Service: ?[name], [name] 12/04/2021 12:30 PM ?Medical Record Number: [id number] ?Patient Account Number: [id number] ?Date of Birth/Sex: ?Treating RN: ?[birth year] (81 y.o. [name], [name] ?Primary Care [name]: [name] ?Other Clinician: Donavan [name] ?Referring [name]: ?Treating [name]/[name]: [name] ?[name] ?Weeks in Treatment: 24 ?Vital Signs ?Time Taken: 13:02 ?Temperature (??F): 97.9 ?Height (in): 64 ?Pulse (bpm): 80 ?Weight (lbs): 135 ?Respiratory Rate (breaths/min): 12 ?Body Mass Index (BMI): 23.2 ?Blood Pressure (mmHg): 144/62 ?Capillary Blood Glucose (mg/dl): 141 ?Reference Range: 80 - 120 mg / dl ?Electronic Signature(s) ?Signed: 12/05/2021 10:07:48 AM By: [name] CHT EMT BS ?, , ?Entered By: [name] on 12/04/2021 13:59:28 ?

## 2021-12-05 NOTE — Progress Notes (Addendum)
[name], [name] ([id number]) ?Visit Report for 12/05/2021 ?HBO Details ?Patient Name: Date of Service: ?[name], [name] 12/05/2021 1:00 PM ?Medical Record Number: [id number] ?Patient Account Number: [id number] ?Date of Birth/Sex: Treating RN: ?1940/11/21 (81 y.o. 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 ?HBO Treatment Details ?Treatment Number: 01 ?Patient Type: Outpatient ?Chamber Type: Monoplace ?Chamber Serial #: [id number] ?Treatment Protocol: 2.0 ATA with 90 minutes oxygen, and no air breaks ?Treatment Details ?Compression Rate Down: 2.0 psi / minute ?De-Compression Rate Up: 2.0 psi / minute ?Air ?breaks ?and ?breathing Decompress Decompress ?Compress Tx Pressure ?Begins Reached periods Begins Ends ?(leave ?unused ?spaces ?blank) ?Chamber Pressure (ATA 1 2 ------2 1 ?) ?Clock Time (24 hr) 13:05 13:15 - - - - - - 11:45 14:53 ?Treatment Length: 108 (minutes) ?Treatment Segments: 4 ?Vital Signs ?Capillary Blood Glucose Reference Range: 80 - 120 mg / dl ?HBO Diabetic Blood Glucose Intervention Range: <131 mg/dl or >249 mg/dl ?Time Vitals Blood Respiratory Capillary Blood Glucose Pulse Action ?Type: ?Pulse: Temperature: ?Taken: ?Pressure: ?Rate: ?Glucose (mg/dl): ?Meter #: Oximetry (%) Taken: ?Pre 12:57 162/58 87 16 97.5 187 100 ?Post 14:56 154/58 73 16 97.6 180 100 ?Treatment Response ?Treatment Toleration: Well ?Treatment Completion Status: Treatment Completed without Adverse Event ?Treatment Notes ?Paper  version used prior to the treatment. Treatment started compression rate of 1.5 PSI/min to 2 PSI. Asked if she was able to clear her ears. She stated ?yes. Compression great changed from 1.5 PSI/min to 2.0 PSI/min to 2ATA. No complaints of any ear problems. After treatment today, I asked if she had any ?ear problems. she stated no. ?Additional Procedure Documentation ?Tissue Sevierity: Fat layer exposed ?Physician HBO Attestation: ?I certify that I supervised this HBO treatment in accordance with Medicare ?guidelines. A trained emergency response team is readily available per Yes ?hospital policies and procedures. ?Continue HBOT as ordered. Yes ?Electronic Signature(s) ?Signed: 12/05/2021 4:54:00 PM By: [name] MD FACS ?Previous Signature: 12/05/2021 3:25:43 PM Version By: [name] EMT ?Previous Signature: 12/05/2021 1:30:15 PM Version By: [name] EMT ?Entered By: [name] on 12/05/2021 16:54:00 ?-------------------------------------------------------------------------------- ?HBO Safety Checklist Details ?Patient Name: ?Date of Service: ?[name], [name] 12/05/2021 1:00 PM ?Medical Record Number: [id number] ?Patient Account Number: [id number] ?Date of Birth/Sex: ?Treating RN: ?1941/08/13 (81 y.o. [name], [name] ?Primary Care [name]: [name] ?Other Clinician: [name] ?Referring Skie Vitrano: ?Treating Janese Radabaugh/[name]: [name] ?[name] ?Weeks in Treatment: 24 ?HBO Safety Checklist Items ?Safety Checklist ?Consent Form Signed ?Patient voided / foley secured and emptied ?When did you last eato 1230 ?Last dose of injectable or oral agent 0800 ?Ostomy pouch emptied and vented if applicable ?NA ?All implantable devices assessed, documented and approved ?NA ?Intravenous access site secured and place ?Valuables secured ?Linens and cotton and cotton/polyester blend (less than 51% polyester) ?Personal oil-based products / skin lotions / body lotions  removed ?Wigs or hairpieces removed ?Smoking or tobacco materials removed ?Books / newspapers / magazines / loose paper removed ?Cologne, aftershave, perfume and deodorant removed ?Jewelry removed (may wrap wedding band) ?NA ?Make-up removed ?Hair care products removed ?Battery operated devices (external) removed ?Heating patches  and chemical warmers removed ?Titanium eyewear removed ?NA ?Nail polish cured greater than 10 hours Saturday 12/02/2021 ?Casting material cured greater than 10 hours ?NA ?Hearing aids removed ?NA ?Loose dentures or partials removed ?NA ?Prosthetics have been removed ?NA ?Patient demonstrates correct use of air break device (if applicable) ?Patient concerns have been addressed ?Patient grounding bracelet on and cord attached to chamber ?Specifics for Inpatients (complete in addition to above) ?Medication sheet sent with patient ?NA ?Intravenous medications needed or due during therapy sent with patient ?NA ?Drainage tubes (e.g. nasogastric tube or chest tube secured and vented) ?NA ?Endotracheal or Tracheotomy tube secured ?NA ?Cuff deflated of air and inflated with saline ?NA ?Airway suctioned ?NA ?Notes ?Paper version used prior to the treatment. ?Electronic Signature(s) ?Signed: 12/05/2021 1:26:21 PM By: [name] EMT ?Entered By: [name] on 12/05/2021 13:26:20 ?

## 2021-12-05 NOTE — Patient Instructions (Signed)
Take '1mg'$  Repeglanide before supper also ? ?Reduce Glimeperide to 1/2 in am only ? ?Check blood sugars on waking up 3 days a week ? ?Also check blood sugars about 2 hours after meals and do this after different meals by rotation ? ?Recommended blood sugar levels on waking up are 90-130 and about 2 hours after meal is 130-160 ? ?Please bring your blood sugar monitor to each visit, thank you ? ? ? ?

## 2021-12-06 ENCOUNTER — Encounter (HOSPITAL_BASED_OUTPATIENT_CLINIC_OR_DEPARTMENT_OTHER): Payer: Medicare Other | Admitting: General Surgery

## 2021-12-06 ENCOUNTER — Ambulatory Visit (INDEPENDENT_AMBULATORY_CARE_PROVIDER_SITE_OTHER): Payer: Medicare Other | Admitting: Ophthalmology

## 2021-12-06 ENCOUNTER — Encounter (INDEPENDENT_AMBULATORY_CARE_PROVIDER_SITE_OTHER): Payer: Medicare Other | Admitting: Ophthalmology

## 2021-12-06 ENCOUNTER — Encounter (INDEPENDENT_AMBULATORY_CARE_PROVIDER_SITE_OTHER): Payer: Self-pay | Admitting: Ophthalmology

## 2021-12-06 DIAGNOSIS — M86671 Other chronic osteomyelitis, right ankle and foot: Secondary | ICD-10-CM | POA: Diagnosis not present

## 2021-12-06 DIAGNOSIS — H47011 Ischemic optic neuropathy, right eye: Secondary | ICD-10-CM | POA: Diagnosis not present

## 2021-12-06 DIAGNOSIS — L97512 Non-pressure chronic ulcer of other part of right foot with fat layer exposed: Secondary | ICD-10-CM | POA: Diagnosis not present

## 2021-12-06 DIAGNOSIS — E11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: Secondary | ICD-10-CM | POA: Diagnosis not present

## 2021-12-06 DIAGNOSIS — E113493 Type 2 diabetes mellitus with severe nonproliferative diabetic retinopathy without macular edema, bilateral: Secondary | ICD-10-CM

## 2021-12-06 DIAGNOSIS — E11621 Type 2 diabetes mellitus with foot ulcer: Secondary | ICD-10-CM | POA: Diagnosis not present

## 2021-12-06 DIAGNOSIS — I6523 Occlusion and stenosis of bilateral carotid arteries: Secondary | ICD-10-CM | POA: Diagnosis not present

## 2021-12-06 DIAGNOSIS — T8131XD Disruption of external operation (surgical) wound, not elsewhere classified, subsequent encounter: Secondary | ICD-10-CM | POA: Diagnosis not present

## 2021-12-06 DIAGNOSIS — L97514 Non-pressure chronic ulcer of other part of right foot with necrosis of bone: Secondary | ICD-10-CM | POA: Diagnosis not present

## 2021-12-06 LAB — GLUCOSE, CAPILLARY
Glucose-Capillary: 180 mg/dL — ABNORMAL HIGH (ref 70–99)
Glucose-Capillary: 156 mg/dL — ABNORMAL HIGH (ref 70–99)

## 2021-12-06 NOTE — Assessment & Plan Note (Signed)
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

## 2021-12-06 NOTE — Progress Notes (Addendum)
[name], [name] ([id number]) ?Visit Report for 12/06/2021 ?Arrival Information Details ?Patient Name: Date of Service: ?[name], [name] 12/06/2021 3:00 PM ?Medical Record Number: [id number] ?Patient Account Number: [id number] ?Date of Birth/Sex: Treating RN: ?1941/05/21 (81 y.o. F) Sharyn Creamer ?Primary Care Leopold Smyers: [name] Other Clinician: ?Referring Evanee Lubrano: ?Treating Grizel Vesely/[name]: [name] ?[name] ?Weeks in Treatment: 24 ?Visit Information History Since Last Visit ?Added or deleted any medications: No ?Patient Arrived: [name] ?Any new allergies or adverse reactions: No ?Arrival Time: 15:07 ?Had a fall or experienced change in No ?Transfer Assistance: None ?activities of daily living that may affect ?Patient Requires Transmission-Based Precautions: No ?risk of falls: ?Patient Has Alerts: Yes ?Signs or symptoms of abuse/neglect since last visito No ?Patient Alerts: Patient on Blood Thinner ?Hospitalized since last visit: No ?Implantable device outside of the clinic excluding No ?cellular tissue based products placed in the center ?since last visit: ?Has Dressing in Place as Prescribed: Yes ?Pain Present Now: No ?Electronic Signature(s) ?Signed: 12/06/2021 5:01:39 PM By: Sharyn Creamer RN, BSN ?Entered By: Sharyn Creamer on 12/06/2021 15:08:03 ?-------------------------------------------------------------------------------- ?Encounter Discharge Information Details ?Patient Name: Date of Service: ?[name], [name] 12/06/2021 3:00 PM ?Medical Record Number: [id number] ?Patient Account Number: [id number] ?Date of Birth/Sex: Treating RN: ?[birth year] (81 y.o. [name] ?Primary Care Nicholaos Schippers: [name] Other Clinician: ?Referring Dimitri Dsouza: ?Treating Kennia Vanvorst/[name]: [name] ?[name] ?Weeks in Treatment: 24 ?Encounter Discharge Information Items Post Procedure Vitals ?Discharge Condition: Stable ?Temperature (F): 97.8 ?Ambulatory Status:  Ambulatory ?Pulse (bpm): 78 ?Discharge Destination: Home ?Respiratory Rate (breaths/min): 18 ?Transportation: Private Auto ?Blood Pressure (mmHg): 122/72 ?Schedule Follow-up Appointment: Yes ?Clinical Summary of Care: Provided on 12/06/2021 ?Form Type Recipient ?Paper Patient Patient ?Electronic Signature(s) ?Signed: 12/06/2021 4:26:34 PM By: Lorrin Jackson ?Entered By: Lorrin Jackson on 12/06/2021 16:26:33 ?-------------------------------------------------------------------------------- ?Lower Extremity Assessment Details ?Patient Name: ?Date of Service: ?[name], [name] 12/06/2021 3:00 PM ?Medical Record Number: [id number] ?Patient Account Number: [id number] ?Date of Birth/Sex: ?Treating RN: ?[birth year] (81 y.o. F) Sharyn Creamer ?Primary Care Zaya Kessenich: [name] ?Other Clinician: ?Referring Corie Allis: ?Treating Graig Hessling/[name]: [name] ?[name] ?Weeks in Treatment: 24 ?Edema Assessment ?Assessed: [Left: No] [Right: No] ?Edema: [Left: No] [Right: Yes] ?Calf ?Left: Right: ?Point of Measurement: From Medial Instep 31 cm 30 cm ?Ankle ?Left: Right: ?Point of Measurement: From Medial Instep 19 cm 19.4 cm ?Vascular Assessment ?Pulses: ?Dorsalis Pedis ?Palpable: [Left:Yes] [Right:Yes] ?Electronic Signature(s) ?Signed: 12/06/2021 5:01:39 PM By: Sharyn Creamer RN, BSN ?Entered By: Sharyn Creamer on 12/06/2021 15:19:23 ?-------------------------------------------------------------------------------- ?Multi Wound Chart Details ?Patient Name: ?Date of Service: ?[name], [name] 12/06/2021 3:00 PM ?Medical Record Number: [id number] ?Patient Account Number: [id number] ?Date of Birth/Sex: ?Treating RN: ?December 03, 1940 (81 y.o. F) ?Primary Care [name] Proto: [name] ?Other Clinician: ?Referring Mikie Misner: ?Treating [name] Chavarria/[name]: [name] ?[name] ?Weeks in Treatment: 24 ?Vital Signs ?Height(in): 64 ?Capillary Blood Glucose(mg/dl): 156 ?Weight(lbs): 135 ?Pulse(bpm): 78 ?Body Mass  Index(BMI): 23.2 ?Blood Pressure(mmHg): 122/72 ?Temperature(??F): 97.8 ?Respiratory Rate(breaths/min): 18 ?Photos: [13:Left Amputation Site - Transmetatarsal Right Amputation Site -] [N/A:N/A N/A] ?Wound Location: [13:Surgical Injury] [15:Transmetatarsal Other Lesion] [N/A:N/A] ?Wounding Event: [13:Diabetic Wound/Ulcer of the Lower] [15:Diabetic Wound/Ulcer of the Lower] [N/A:N/A] ?Primary Etiology: [13:Extremity Open Surgical Wound] [15:Extremity N/A] [N/A:N/A] ?Secondary Etiology: [13:Coronary Artery Disease, Deep Vein Coronary Artery Disease, Deep Vein N/A] ?Comorbid History: [13:Thrombosis, Hypertension, Myocardial Thrombosis, Hypertension, Myocardial Infarction, Peripheral Arterial Disease, Infarction, Peripheral Arterial Disease, Type II Diabetes 05/11/2021] [15:Type II Diabetes 09/15/2021] [N/A:N/A] ?Date Acquired: [13:24] [15:11] [N/A:N/A] ?Weeks of Treatment: [13:Open] [15:Open] [N/A:N/A] ?Wound Status: [  13:No] [15:No] [N/A:N/A] ?Wound Recurrence: [13:0.2x0.2x0.1] [15:0.4x0.8x0.1] [N/A:N/A] ?Measurements L x W x D (cm) [13:0.031] [15:0.251] [N/A:N/A] ?A (cm?) : ?rea [13:0.003] [15:0.025] [N/A:N/A] ?Volume (cm?) : [13:99.90%] [15:36.10%] [N/A:N/A] ?% Reduction in A [13:rea: 100.00%] [15:78.80%] [N/A:N/A] ?% Reduction in Volume: [13:Grade 2] [15:Grade 3] [N/A:N/A] ?Classification: [13:Medium] [15:Medium] [N/A:N/A] ?Exudate A mount: [13:Serosanguineous] [15:Serosanguineous] [N/A:N/A] ?Exudate Type: [13:red, brown] [15:red, brown] [N/A:N/A] ?Exudate Color: [13:Distinct, outline attached] [15:Distinct, outline attached] [N/A:N/A] ?Wound Margin: [13:Large (67-100%)] [15:Small (1-33%)] [N/A:N/A] ?Granulation A mount: [13:Red, Pink] [15:Pale] [N/A:N/A] ?Granulation Quality: [13:None Present (0%)] [15:Large (67-100%)] [N/A:N/A] ?Necrotic A mount: ?[13:Fat Layer (Subcutaneous Tissue): Yes Fat Layer (Subcutaneous Tissue): Yes N/A] ?Exposed Structures: ?[13:Fascia: No Tendon: No Muscle: No Joint: No Bone: No Large  (67-100%)] [15:Fascia: No Tendon: No Muscle: No Joint: No Bone: No Large (67-100%)] [N/A:N/A] ?Epithelialization: [13:N/A] [15:Debridement - Selective/Open Wound N/A] ?Debridement: ?Pre-procedure Verification/Time Out N/A [15:15:34] [N/A:N/A] ?Taken: [13:N/A] [15:Callus] [N/A:N/A] ?Tissue Debrided: [13:N/A] [15:Skin/Dermis] [N/A:N/A] ?Level: [13:N/A] [15:0.32] [N/A:N/A] ?Debridement A (sq cm): [13:rea N/A] [15:Curette] [N/A:N/A] ?Instrument: [13:N/A] [15:Minimum] [N/A:N/A] ?Bleeding: [13:N/A] [15:Pressure] [N/A:N/A] ?Hemostasis A chieved: [13:N/A] [15:Procedure was tolerated well] [N/A:N/A] ?Debridement Treatment Response: [13:N/A] [15:0.4x0.8x0.1] [N/A:N/A] ?Post Debridement Measurements L x ?W x D (cm) [13:N/A] [15:0.025] [N/A:N/A] ?Post Debridement Volume: (cm?) [13:N/A] [15:Debridement] [N/A:N/A] ?Treatment Notes ?Wound #13 (Amputation Site - Transmetatarsal) Wound Laterality: Left ?Cleanser ?Soap and Water ?Discharge Instruction: May shower and wash wound with dial antibacterial soap and water prior to dressing change. ?Wound Cleanser ?Discharge Instruction: Cleanse the wound with wound cleanser prior to applying a clean dressing using gauze sponges, not tissue or cotton balls. ?Peri-Wound Care ?Topical ?Primary Dressing ?Promogran Prisma Matrix, 4.34 (sq in) (silver collagen) ?Discharge Instruction: Moisten collagen with KY Jelly ?Secondary Dressing ?ABD Pad, 5x9 ?Discharge Instruction: Apply over primary dressing as directed. ?Secured With ?Kerlix Roll Sterile, 4.5x3.1 (in/yd) ?Discharge Instruction: Secure with Kerlix as directed. ?Transpore Surgical Tape, 2x10 (in/yd) ?Discharge Instruction: Secure dressing with tape as directed. ?Compression Wrap ?Compression Stockings ?Add-Ons ?Wound #15 (Amputation Site - Transmetatarsal) Wound Laterality: Right ?Cleanser ?Soap and Water ?Discharge Instruction: May shower and wash wound with dial antibacterial soap and water prior to dressing change. ?Wound  Cleanser ?Discharge Instruction: Cleanse the wound with wound cleanser prior to applying a clean dressing using gauze sponges, not tissue or cotton balls. ?Peri-Wound Care ?Topical ?Primary Dressing ?Promogran Prisma Matrix, 4.3

## 2021-12-06 NOTE — Progress Notes (Signed)
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 female who presents to the clinic today for:   HPI   9 mos fu OU OCT FP. Pt states "I have noticed a clarity change in my left eye in my vision. I have an appointment with Dr. Marchelle Gearing coming up." Pt uses Brimonidine OD BID. Pt states she had an Angiogram 6 weeks ago for her leg/ foot. Last edited by Nelva Nay on 12/06/2021  8:09 AM.      Referring physician: Sallye Lat, MD [address] [place],  [place] [address]  HISTORICAL INFORMATION:   Selected notes from the MEDICAL RECORD NUMBER    Lab Results  Component Value Date   HGBA1C 7.6 (H) 09/29/2021     CURRENT MEDICATIONS: Current Outpatient Medications (Ophthalmic Drugs)  Medication Sig   brimonidine (ALPHAGAN) 0.2 % ophthalmic solution Place 1 drop into the right eye 2 (two) times daily.   Propylene Glycol (SYSTANE BALANCE) 0.6 % SOLN Place 1 drop into the left eye 3 (three) times daily as needed (dry/irritated eyes).   No current facility-administered medications for this visit. (Ophthalmic Drugs)   Current Outpatient Medications (Other)  Medication Sig   acetaminophen (TYLENOL) 500 MG tablet Take 1,000 mg by mouth every 6 (six) hours as needed for moderate pain or headache.   aspirin EC 81 MG tablet Take 81 mg by mouth at bedtime.    atorvastatin (LIPITOR) 80 MG tablet TAKE 1 TABLET BY MOUTH ONCE DAILY. PATIENT  MUST  KEEP  UPCOMING  APPOINTMENT  FOR  FUTURE  REFILLS   CINNAMON PO Take 2 tablets by mouth in the morning, at noon, and at bedtime.    empagliflozin (JARDIANCE) 25 MG TABS tablet Take 1 tablet (25 mg total) by mouth daily before breakfast.   FEROCON capsule Take 1 capsule by mouth 2 (two) times daily.   fluconazole (DIFLUCAN) 150 MG tablet Take 1 tablet (150 mg total) by mouth  daily. Can take 1 tab every 3 days if still having symptoms (Patient not taking: Reported on 12/05/2021)   glimepiride (AMARYL) 2 MG tablet TAKE 1 TABLET BY MOUTH ONCE DAILY BEFORE BREAKFAST   glucose blood (ONETOUCH VERIO) test strip Use to check blood sugars once daily   hydrochlorothiazide (HYDRODIURIL) 12.5 MG tablet Take 1 tablet by mouth once daily   Lancets (ONETOUCH DELICA PLUS LANCET33G) MISC Use to check blood sugars twice daily.   metFORMIN (GLUCOPHAGE) 1000 MG tablet Take 1 tablet by mouth twice daily (Patient taking differently: Take 1,000 mg by mouth in the morning.)   metoprolol tartrate (LOPRESSOR) 25 MG tablet TAKE 1 & 1/2 (ONE & ONE-HALF) TABLETS BY MOUTH TWICE DAILY   NON FORMULARY Prisma topical ointment for right foot   pantoprazole (PROTONIX) 40 MG tablet Take 1 tablet (40 mg total) by mouth daily. (Patient taking differently: Take 40 mg by mouth every Monday, Wednesday, and Friday.)   Probiotic Product ([employer] SENIOR PROBIOTIC PO) Take 1 capsule by mouth every evening.   pyridoxine (B-6) 100 MG tablet Take 100 mg by mouth every evening.    repaglinide (PRANDIN) 1 MG tablet Take 1 tablet (1 mg total) by mouth daily before breakfast.   telmisartan (MICARDIS) 80 MG tablet Take 1 tablet by mouth once daily   XARELTO 2.5  MG TABS tablet Take 1 tablet by mouth twice daily   Current Facility-Administered Medications (Other)  Medication Route   sodium chloride flush (NS) 0.9 % injection 3 mL Intravenous      REVIEW OF SYSTEMS: ROS   Negative for: Constitutional, Gastrointestinal, Neurological, Skin, Genitourinary, Musculoskeletal, HENT, Endocrine, Cardiovascular, Eyes, Respiratory, Psychiatric, Allergic/Imm, Heme/Lymph Last edited by Edmon Crape, MD on 12/06/2021  9:02 AM.       ALLERGIES Allergies  Allergen Reactions   Augmentin [Amoxicillin-Pot Clavulanate] Other (See Comments)    Elevates potassium level   Bactrim [Sulfamethoxazole-Trimethoprim] Other (See Comments)     ^ K+( elevated)    Demerol [Meperidine]     Delusional    Scopolamine     Delusional    Tramadol Other (See Comments)    Keeps awake   Versed [Midazolam] Anxiety    Frantic, out of my mind, agitated     PAST MEDICAL HISTORY Past Medical History:  Diagnosis Date   Anemia    after CABG in june 2021   Arthritis    Cancer (HCC)    removal from nose - MOSE procedure    Complication of anesthesia    VERSED- agitated, muscle spasms, "jerking" , frantic , (never had this occurence in the pas)    Coronary artery disease    Diabetes mellitus without complication (HCC)    Dysrhythmia    PVC's   GERD (gastroesophageal reflux disease)    Heart murmur    History of hiatal hernia    Hyperlipidemia    Hypertension 11/20/11   ECHO- EF>55% Borderline concentric left ventricular hypertrophy. There is a small calcified mass in the L:A near the LA appendage. No valvular masses seen with associated mitral annular calcification. LA Volume/ BSA27.4 ml/m2 No AS. Right ventricular systolic pressure is elevated at .   Myocardial infarction [facility])    June 2021   Neuromuscular disorder (HCC)    neuropathy in bilateral feet   Peripheral vascular disease (HCC)    Pneumonia    not hosp.    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  PTCAs with drug coated balloon Lt ant tibial artery and Lt popliteal artery  11/26/2019   Past Surgical History:  Procedure Laterality Date   ABDOMINAL AORTAGRAM  11/25/2019   ABDOMINAL AORTOGRAM W/LOWER EXTREMITY    ABDOMINAL AORTOGRAM N/A 06/15/2019   Procedure: ABDOMINAL AORTOGRAM;  Surgeon: Runell Gess, MD;  [facility]: [facility];  Service: Cardiovascular;  Laterality: N/A;   ABDOMINAL AORTOGRAM W/LOWER EXTREMITY N/A 11/25/2019   Procedure: ABDOMINAL AORTOGRAM W/LOWER EXTREMITY;  Surgeon: Iran Ouch, MD;  Location: [facility];  Service: Cardiovascular;  Laterality: N/A;  Lt leg   ABDOMINAL AORTOGRAM W/LOWER EXTREMITY N/A  08/01/2020   Procedure: ABDOMINAL AORTOGRAM W/LOWER EXTREMITY;  Surgeon: Runell Gess, MD;  [facility]: [facility];  Service: Cardiovascular;  Laterality: N/A;   ABDOMINAL AORTOGRAM W/LOWER EXTREMITY N/A 04/28/2021   Procedure: ABDOMINAL AORTOGRAM W/LOWER EXTREMITY;  Surgeon: Sherren Kerns, MD;  [facility]: [facility];  Service: Cardiovascular;  Laterality: N/A;   ABDOMINAL AORTOGRAM W/LOWER EXTREMITY N/A 10/13/2021   Procedure: ABDOMINAL AORTOGRAM W/LOWER EXTREMITY;  Surgeon: Leonie Douglas, MD;  [facility]: [facility];  Service: Cardiovascular;  Laterality: N/A;   AMPUTATION TOE Left 05/03/2021   Procedure: AMPUTATION OF THIRD LEFT  TOE;  Surgeon: Leonie Douglas, MD;  Location: [facility] OR;  Service: Vascular;  Laterality: Left;   APPENDECTOMY  APPLICATION OF WOUND VAC Right 07/21/2019   Procedure: Application Of Prevena Wound Vac Right Groin;  Surgeon: Nada Libman, MD;  Location: [facility];  Service: Vascular;  Laterality: Right;   CARDIAC CATHETERIZATION     CARPAL TUNNEL RELEASE     CARPAL TUNNEL RELEASE     CESAREAN SECTION     x 2   COLONOSCOPY     CORONARY ARTERY BYPASS GRAFT N/A 03/24/2020   Procedure: CORONARY ARTERY BYPASS GRAFTING (CABG) using LIMA to LAD (m); RIMA to RAMUS; Endoscopic Right Greater Saphenous Vein: SVG to Diag1; SVG to PLB (right); and SVG to PL (left).;  Surgeon: Linden Dolin, MD;  Location: [facility];  Service: Open Heart Surgery;  Laterality: N/A;  BILATERAL IMA   ENDARTERECTOMY FEMORAL Right 07/21/2019   Procedure: RIGHT ENDARTERECTOMY FEMORAL WITH PATCH ANGIOPLASTY;  Surgeon: Nada Libman, MD;  Location: [facility];  Service: Vascular;  Laterality: Right;   ENDARTERECTOMY FEMORAL Right 08/16/2020   Procedure: RIGHT FEMORAL ENDARTERECTOMY;  Surgeon: Maeola Harman, MD;  Location: [facility] OR;  Service: Vascular;  Laterality: Right;   ENDOVEIN HARVEST OF GREATER SAPHENOUS VEIN Right 03/24/2020   Procedure: ENDOVEIN HARVEST OF GREATER  SAPHENOUS VEIN;  Surgeon: Linden Dolin, MD;  Location: [facility];  Service: Open Heart Surgery;  Laterality: Right;   EYE SURGERY     cataract removal bilaterally   FEMORAL-POPLITEAL BYPASS GRAFT Right 08/16/2020   Procedure: BYPASS GRAFT FEMORAL-POPLITEAL ARTERY;  Surgeon: Maeola Harman, MD;  Location: [facility] OR;  Service: Vascular;  Laterality: Right;   FEMORAL-POPLITEAL BYPASS GRAFT Left 05/03/2021   Procedure: LEFT FEMORAL TO BELOW KNEE POPLITEAL ARTERY BYPASS GRAFTING WITH 6MMX80 PTFE GRAFT;  Surgeon: Leonie Douglas, MD;  Location: [facility] OR;  Service: Vascular;  Laterality: Left;   INTRAVASCULAR LITHOTRIPSY  11/25/2019   Procedure: INTRAVASCULAR LITHOTRIPSY;  Surgeon: Iran Ouch, MD;  [facility];  Service: Cardiovascular;;  LT. SFA   LEFT HEART CATH AND CORONARY ANGIOGRAPHY N/A 03/22/2020   Procedure: LEFT HEART CATH AND CORONARY ANGIOGRAPHY;  Surgeon: Swaziland, Peter M, MD;  Location: [facility] INVASIVE CV LAB;  Service: Cardiovascular;  Laterality: N/A;   LOWER EXTREMITY ANGIOGRAPHY Bilateral 06/15/2019   Procedure: Lower Extremity Angiography;  Surgeon: Runell Gess, MD;  Location: [facility] INVASIVE CV LAB;  Service: Cardiovascular;  Laterality: Bilateral;   LOWER EXTREMITY ANGIOGRAPHY Right 08/03/2019   Procedure: LOWER EXTREMITY ANGIOGRAPHY;  Surgeon: Runell Gess, MD;  [facility];  Service: Cardiovascular;  Laterality: Right;   LOWER EXTREMITY ANGIOGRAPHY N/A 09/07/2021   Procedure: LOWER EXTREMITY ANGIOGRAPHY;  Surgeon: Cephus Shelling, MD;  [facility];  Service: Cardiovascular;  Laterality: N/A;   PATCH ANGIOPLASTY Right 07/21/2019   Procedure: Patch Angioplasty Right Femoral Artery;  Surgeon: Nada Libman, MD;  Location: [facility] OR;  Service: Vascular;  Laterality: Right;   PERIPHERAL VASCULAR ATHERECTOMY  08/03/2019   Procedure: PERIPHERAL VASCULAR ATHERECTOMY;  Surgeon: Runell Gess, MD;  [facility];  Service: Cardiovascular;;  right SFA, right TP trunk   PERIPHERAL VASCULAR ATHERECTOMY  11/25/2019   Procedure: PERIPHERAL VASCULAR ATHERECTOMY;  Surgeon: Iran Ouch, MD;  [facility];  Service: Cardiovascular;;  Lt.  POPLITEAL and AT   PERIPHERAL VASCULAR BALLOON ANGIOPLASTY Left 06/15/2019   Procedure: PERIPHERAL VASCULAR BALLOON ANGIOPLASTY;  Surgeon: Runell Gess, MD;  [facility];  Service: Cardiovascular;  Laterality: Left;  SFA UNSUCCESSFUL  UNABLE TO CROSS LESION   PERIPHERAL VASCULAR BALLOON ANGIOPLASTY  08/03/2019   Procedure: PERIPHERAL VASCULAR BALLOON ANGIOPLASTY;  Surgeon: Runell Gess, MD;  [facility];  Service: Cardiovascular;;  right SFA, Right TP trunk   PERIPHERAL VASCULAR BALLOON ANGIOPLASTY  09/07/2021   Procedure: PERIPHERAL VASCULAR BALLOON ANGIOPLASTY;  Surgeon: Cephus Shelling, MD;  [facility];  Service: Cardiovascular;;   PERIPHERAL VASCULAR BALLOON ANGIOPLASTY Right 10/13/2021   Procedure: PERIPHERAL VASCULAR BALLOON ANGIOPLASTY;  Surgeon: Leonie Douglas, MD;  [facility];  Service: Cardiovascular;  Laterality: Right;   TEE WITHOUT CARDIOVERSION N/A 03/24/2020   Procedure: TRANSESOPHAGEAL ECHOCARDIOGRAM (TEE);  Surgeon: Linden Dolin, MD;  Location: [facility] OR;  Service: Open Heart Surgery;  Laterality: N/A;   TONSILLECTOMY     and adenoidectomy   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/2019   Procedure: TRANSMETATARSAL AMPUTATION;  Surgeon: Vivi Barrack, DPM;  Location: [facility];  Service: Podiatry;  Laterality: Right;   TRANSMETATARSAL AMPUTATION Left 05/11/2021   Procedure: LEFT TRANSMETATARSAL AMPUTATION;  Surgeon: Maeola Harman, MD;  Location: [facility] OR;  Service: Vascular;  Laterality: Left;   TUBAL LIGATION      FAMILY HISTORY Family History  Problem Relation Age of Onset   Cancer - Prostate Father    Cancer - Colon Father    Stroke Mother     Hypertension Mother    Hyperlipidemia Mother    Melanoma Brother     SOCIAL HISTORY Social History   Tobacco Use   Smoking status: Former    Types: Cigarettes    Quit date: 03/31/1972    Years since quitting: 49.7   Smokeless tobacco: Never  Vaping Use   Vaping Use: Never used  Substance Use Topics   Alcohol use: No   Drug use: No         OPHTHALMIC EXAM:  Base Eye Exam     Visual Acuity (ETDRS)       Right Left   Dist [place] 20/25 20/50 -2   Dist ph [place]  20/25         Tonometry (Tonopen, 8:12 AM)       Right Left   Pressure 17 15         Pupils       Pupils Dark Light Shape React APD   Right PERRL 3 2 Round Brisk None   Left PERRL 3 2 Round Brisk None         Visual Fields (Counting fingers)       Left Right    Full Full         Extraocular Movement       Right Left    Full Full         Neuro/Psych     Oriented x3: Yes   Mood/Affect: Normal         Dilation     Both eyes: 1.0% Mydriacyl, 2.5% Phenylephrine @ 8:12 AM           Slit Lamp and Fundus Exam     External Exam       Right Left   External Normal Normal         Slit Lamp Exam       Right Left   Lids/Lashes Normal Normal   Conjunctiva/Sclera White and quiet White and quiet   Cornea Clear Clear   Anterior Chamber Deep and quiet Deep and quiet   Iris Round and reactive Round  and reactive   Lens Centered posterior chamber intraocular lens Centered posterior chamber intraocular lens   Anterior Vitreous Normal Normal         Fundus Exam       Right Left   Posterior Vitreous Posterior vitreous detachment Normal   Disc 1+ Pallor temporal Normal   C/D Ratio 0.0 0.0   Macula no macular thickening, Microaneurysms, no clinically significant macular edema no macular thickening, Microaneurysms, no clinically significant macular edema   Vessels NPDR-moderate to severe, no progression NPDR-moderate to severe   Periphery Dot-blot hemorrhage, less active  temporally, in region of previous local scatter PRP in region of nonperfusion Normal, pigmentary degeneration nasally, with bone spicule like changes but most likely this is benign reticulated RPE degeneration            IMAGING AND PROCEDURES  Imaging and Procedures for 12/06/21  OCT, Retina - OU - Both Eyes       Right Eye Quality was good. Scan locations included subfoveal. Central Foveal Thickness: 229. Progression has been stable. Findings include abnormal foveal contour.   Left Eye Quality was good. Scan locations included subfoveal. Central Foveal Thickness: 228. Progression has been stable. Findings include normal foveal contour.   Notes Diffuse macular atrophy OD, accompanies the temporal allergy to the optic nerve OD  OU no sign of active maculopathy     Color Fundus Photography Optos - OU - Both Eyes       Right Eye Progression has been stable. Disc findings include pallor. Macula : normal observations, microaneurysms. Periphery : normal observations.   Left Eye Progression has been stable. Disc findings include normal observations. Macula : microaneurysms. Periphery : normal observations.   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 Anterior  ischemic optic neuropathy of right eye  H47.011       1.  OU no signs of progression of NPDR in fact now only moderate to severe changes noted on peripheral retinal examination.  Region that was present temporally in the right eye of large dot blot hemorrhages signifying a regional nonperfusion have been well treated with sectorial local PRP temporally OD  2.  OS no sign of progression we will continue to monitor and observe  3.  OD with temporal optic pallor, compromised optic nerve perfusion likely from prior AI ON, follow-up per Dr. Marchelle Gearing as scheduled  Ophthalmic Meds Ordered this visit:  No orders of the defined types were placed in this encounter.      Return in about 1 year (around 12/07/2022) for DILATE OU, COLOR FP, OCT.  There are no Patient Instructions on file for this visit.   Explained the diagnoses, plan, and follow up with  the patient and they expressed understanding.  Patient expressed understanding of the importance of proper follow up care.   Alford Highland Ellarose Brandi M.D. Diseases & Surgery of the Retina and Vitreous [facility] 12/06/21     Abbreviations: M myopia (nearsighted); A astigmatism; H hyperopia (farsighted); P presbyopia; Mrx spectacle prescription;  CTL contact lenses; OD right eye; OS left eye; OU both eyes  XT exotropia; ET esotropia; PEK punctate epithelial keratitis; PEE punctate epithelial erosions; DES dry eye syndrome; MGD meibomian gland dysfunction; ATs artificial tears; PFAT's preservative free artificial tears; NSC nuclear sclerotic cataract; PSC posterior subcapsular cataract; ERM epi-retinal membrane; PVD posterior vitreous detachment; RD retinal detachment; DM diabetes mellitus; DR diabetic retinopathy; NPDR non-proliferative diabetic retinopathy; PDR proliferative diabetic retinopathy; CSME clinically significant macular edema; DME diabetic macular edema; dbh dot blot hemorrhages; CWS cotton wool spot; POAG primary open angle  glaucoma; C/D cup-to-disc ratio; HVF humphrey visual field; GVF goldmann visual field; OCT optical coherence tomography; IOP intraocular pressure; BRVO Branch retinal vein occlusion; CRVO central retinal vein occlusion; CRAO central retinal artery occlusion; BRAO branch retinal artery occlusion; RT retinal tear; SB scleral buckle; PPV pars plana vitrectomy; VH Vitreous hemorrhage; PRP panretinal laser photocoagulation; IVK intravitreal kenalog; VMT vitreomacular traction; MH Macular hole;  NVD neovascularization of the disc; NVE neovascularization elsewhere; AREDS age related eye disease study; ARMD age related macular degeneration; POAG primary open angle glaucoma; EBMD epithelial/anterior basement membrane dystrophy; ACIOL anterior chamber intraocular lens; IOL intraocular lens; PCIOL posterior chamber intraocular lens; Phaco/IOL phacoemulsification with intraocular lens placement; PRK photorefractive keratectomy; LASIK laser assisted in situ keratomileusis; HTN hypertension; DM diabetes mellitus; COPD chronic obstructive pulmonary disease

## 2021-12-06 NOTE — Assessment & Plan Note (Signed)
History of no sign of progression of visual field as per Dr. Warden Fillers ?

## 2021-12-06 NOTE — Progress Notes (Signed)
AZARI, JANSSENS ([id number]) ?Visit Report for 12/06/2021 ?Arrival Information Details ?Patient Name: Date of Service: ?[name], [name] 12/06/2021 1:00 PM ?Medical Record Number: [id number] ?Patient Account Number: [id number] ?Date of Birth/Sex: Treating RN: ?10-23-1940 (81 y.o. [name], [name] ?Primary Care Deborahann Poteat: [name] ?Other Clinician: [name] ?Referring Corrina Steffensen: ?Treating Naija Troost/[name]: [name] ?[name] ?Weeks in Treatment: 24 ?Visit Information History Since Last Visit ?All ordered tests and consults were completed: Yes ?Patient Arrived: [name] ?Added or deleted any medications: No ?Arrival Time: 12:33 ?Any new allergies or adverse reactions: No ?Accompanied By: self ?Had a fall or experienced change in No ?Transfer Assistance: None ?activities of daily living that may affect ?Patient Identification Verified: Yes ?risk of falls: ?Secondary Verification Process Completed: Yes ?Signs or symptoms of abuse/neglect since last visito No ?Patient Requires Transmission-Based Precautions: No ?Hospitalized since last visit: No ?Patient Has Alerts: Yes ?Implantable device outside of the clinic excluding No ?Patient Alerts: Patient on Blood Thinner cellular tissue based products placed in the center ?since last visit: ?Pain Present Now: No ?Electronic Signature(s) ?Signed: 12/06/2021 4:09:02 PM By: [name] CHT EMT BS ?, , ?Entered By: [name] on 12/06/2021 13:12:07 ?-------------------------------------------------------------------------------- ?Encounter Discharge Information Details ?Patient Name: Date of Service: ?[name], [name] 12/06/2021 1:00 PM ?Medical Record Number: [id number] ?Patient Account Number: [id number] ?Date of Birth/Sex: Treating RN: ?02-27-1941 (81 y.o. [name], [name] ?Primary Care Tiffeny Minchew: [name] ?Other Clinician: [name] ?Referring Christin Mccreedy: ?Treating Nyles Mitton/[name]: [name] ?[name] ?Weeks in Treatment: 24 ?Encounter Discharge Information Items ?Discharge Condition: Stable ?Ambulatory Status: [name] ?Discharge Destination: Other (Note Required) ?Transportation: Other ?Accompanied By: self ?Schedule Follow-up Appointment: No ?Clinical Summary of Care: ?Notes ?Patient has wound care encounter after treatment today. ?Electronic Signature(s) ?Signed: 12/06/2021 4:02:31 PM By: [name] CHT EMT BS ?, , ?Entered By: [name] on 12/06/2021 16:02:31 ?-------------------------------------------------------------------------------- ?Vitals Details ?Patient Name: ?Date of Service: ?[name], [name] 12/06/2021 1:00 PM ?Medical Record Number: [id number] ?Patient Account Number: [id number] ?Date of Birth/Sex: ?Treating RN: ?07/20/1941 (81 y.o. [name], [name] ?Primary Care Adelei Scobey: [name] ?Other Clinician: [name] ?Referring Jhordyn Hoopingarner: ?Treating Alezander Dimaano/[name]: [name] ?[name] ?Weeks in Treatment: 24 ?Vital Signs ?Time Taken: 12:51 ?Temperature (??F): 97.3 ?Height (in): 64 ?Pulse (bpm): 74 ?Weight (lbs): 135 ?Respiratory Rate (breaths/min): 16 ?Body Mass Index (BMI): 23.2 ?Blood Pressure (mmHg): 154/58 ?Capillary Blood Glucose (mg/dl): 180 ?Reference Range: 80 - 120 mg / dl ?Airway ?Pulse Oximetry (%): 100 ?Electronic Signature(s) ?Signed: 12/06/2021 4:09:02 PM By: [name] CHT EMT BS ?, , ?Entered By: [name] on 12/06/2021 13:35:58 ?

## 2021-12-06 NOTE — Progress Notes (Signed)
[name], [name] ([id number]) ?Visit Report for 12/06/2021 ?Problem List Details ?Patient Name: Date of Service: ?[name], [name] 12/06/2021 1:00 PM ?Medical Record Number: [id number] ?Patient Account Number: [id number] ?Date of Birth/Sex: Treating RN: ?[birth year] (81 y.o. [name], [name] ?Primary Care Provider: [name] ?Other Clinician: [name] ?Referring Provider: ?Treating Provider/[name]: [name] ?[name] ?Weeks in Treatment: 24 ?Active Problems ?ICD-10 ?Encounter ?Code Description Active Date MDM ?Diagnosis ?E11.621 Type 2 diabetes mellitus with foot ulcer 06/15/2021 No Yes ?T81.31XD Disruption of external operation (surgical) wound, not elsewhere classified, 06/15/2021 No Yes ?subsequent encounter ?L97.514 Non-pressure chronic ulcer of other part of right foot with necrosis of bone 09/20/2021 No Yes ?E11.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 06/15/2021 No Yes ?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, right ankle and foot 10/25/2021 No Yes ?Inactive Problems ?ICD-10 ?Code Description Active Date Inactive Date ?L97.428 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 06/15/2021 06/15/2021 ?L97.528 Non-pressure chronic ulcer of other part of left foot with other specified severity 06/15/2021 06/15/2021 ?Resolved Problems ?Electronic Signature(s) ?Signed: 12/06/2021 5:31:28 PM By: [name] MD FACS ?Entered By: [name] on 12/06/2021 17:31:28 ?-------------------------------------------------------------------------------- ?SuperBill Details ?Patient Name: ?Date of Service: ?[name], [name] 12/06/2021 ?Medical Record Number: [id number] ?Patient Account Number: [id number] ?Date of Birth/Sex: ?Treating RN: ?May 15, 1941 (81 y.o. [name], [name] ?Primary Care Provider: [name] ?Other Clinician: [name] ?Referring Provider: ?Treating Provider/[name]: [name] ?[name] ?Weeks in Treatment: 24 ?Diagnosis Coding ?ICD-10 Codes ?Code Description ?E11.621 Type 2 diabetes mellitus with foot ulcer ?T81.31XD Disruption of external operation (surgical) wound, not elsewhere classified, subsequent encounter ?L97.514 Non-pressure chronic ulcer of other part of right foot with necrosis of bone ?E11.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 ?P50.932 Other chronic osteomyelitis, right ankle and foot ?Facility Procedures ?CPT4 Code: 67124580 ?Description: G0277-(Facility Use Only) HBOT full body chamber, 56mn , ICD-10 Diagnosis Description 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 M86.671 Other  ?chronic osteomyelitis, right ankle and foot T81.31XD Disruption of external operation (surgical) wound, not elsewhere classified, subse ?Modifier: quent encounter ?Quantity: 4 ?Physician Procedures ?: CPT4 Code Description Modifier [id number]- WC PHYS HYPERBARIC OXYGEN THERAPY ICD-10 Diagnosis Description 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

## 2021-12-06 NOTE — Progress Notes (Signed)
TECLA, MAILLOUX ([id number]) ?Visit Report for 12/06/2021 ?HBO Details ?Patient Name: Date of Service: ?[name], [name] 12/06/2021 1:00 PM ?Medical Record Number: [id number] ?Patient Account Number: [id number] ?Date of Birth/Sex: Treating RN: ?1941-02-04 (81 y.o. 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 ?HBO Treatment Details ?Treatment Number: 20 ?Patient Type: Outpatient ?Chamber Type: Monoplace ?Chamber Serial #: [id number] ?Treatment Protocol: 2.0 ATA with 90 minutes oxygen, and no air breaks ?Treatment Details ?Compression Rate Down: 2.0 psi / minute ?De-Compression Rate Up: 2.0 psi / minute ?Air ?breaks ?and ?breathing Decompress Decompress ?Compress Tx Pressure ?Begins Reached periods Begins Ends ?(leave ?unused ?spaces ?blank) ?Chamber Pressure (ATA 1 2 ------2 1 ?) ?Clock Time (24 hr) 12:57 13:06 - - - - - - 14:36 14:43 ?Treatment Length: 106 (minutes) ?Treatment Segments: 4 ?Vital Signs ?Capillary Blood Glucose Reference Range: 80 - 120 mg / dl ?HBO Diabetic Blood Glucose Intervention Range: <131 mg/dl or >249 mg/dl ?Time Vitals Blood Respiratory Capillary Blood Glucose Pulse Action ?Type: ?Pulse: Temperature: ?Taken: ?Pressure: ?Rate: ?Glucose (mg/dl): ?Meter #: Oximetry (%) Taken: ?Pre 12:51 154/58 74 16 97.3 180 2 100 ?Post 14:51 122/72 77 16 97.8 156 2 ?Treatment Response ?Treatment Toleration: Well ?Treatment Completion Status: Treatment Completed without Adverse Event ?Treatment  Notes ?Patient tolerated travel well and had no issues during treatment. Patient has wound care encounter after HBO treatment. ?Additional Procedure Documentation ?Tissue Sevierity: Fat layer exposed ?Physician HBO Attestation: ?I certify that I supervised this HBO treatment in accordance with Medicare ?guidelines. A trained emergency response team is readily available per Yes ?hospital policies and procedures. ?Continue HBOT as ordered. Yes ?Electronic Signature(s) ?Signed: 12/06/2021 5:31:05 PM By: [name] MD FACS ?Previous Signature: 12/06/2021 4:01:45 PM Version By: [name] CHT EMT BS ?, , ?Entered By: [name] on 12/06/2021 17:31:04 ?-------------------------------------------------------------------------------- ?HBO Safety Checklist Details ?Patient Name: ?Date of Service: ?[name], [name] 12/06/2021 1:00 PM ?Medical Record Number: [id number] ?Patient Account Number: [id number] ?Date of Birth/Sex: ?Treating RN: ?[birth year] (81 y.o. [name], [name] ?Primary Care [name]: [name] ?Other Clinician: [name] ?Referring Kerigan Narvaez: ?Treating Millee Denise/[name]: [name] ?[name] ?Weeks in Treatment: 24 ?HBO Safety Checklist Items ?Safety Checklist ?Consent Form Signed ?Patient voided / foley secured and emptied ?When did you last eato 1215 ?Last dose of injectable or oral agent 0730 ?Ostomy pouch emptied and vented if applicable ?NA ?All implantable devices assessed, documented and approved ?NA ?Intravenous access site secured and place Right Forearm PICC ?Valuables secured ?Linens and cotton and cotton/polyester blend (less than 51% polyester) ?Personal oil-based products / skin lotions / body lotions removed ?Wigs or hairpieces removed Patient removed item. ?Smoking or tobacco materials removed ?NA ?Books / newspapers / magazines / loose paper removed ?Cologne, aftershave, perfume and deodorant removed ?Jewelry removed (may wrap wedding  band) ?Make-up removed ?Hair care products removed ?Battery operated devices (external) removed ?Heating patches and chemical warmers removed ?Titanium eyewear removed ?Nail polish cured greater than 10 hours This past Saturday. ?Casting material cured greater than 10 hours ?NA ?Hearing aids removed ?NA ?Loose dentures or partials removed ?NA ?Prosthetics have been removed ?NA ?Patient  demonstrates correct use of air break device (if applicable) ?Patient concerns have been addressed ?Patient grounding bracelet on and cord attached to chamber ?Specifics for Inpatients (complete in addition to above) ?Medication sheet sent with patient ?NA ?Intravenous medications needed or due during therapy sent with patient ?NA ?Drainage tubes (e.g. nasogastric tube or chest tube secured and vented) ?NA ?Endotracheal or Tracheotomy tube secured ?NA ?Cuff deflated of air and inflated with saline ?NA ?Airway suctioned ?NA ?Notes ?Paper version used prior to treatment. ?Electronic Signature(s) ?Signed: 12/06/2021 4:09:02 PM By: [name] CHT EMT BS ?, , ?Entered By: [name] on 12/06/2021 13:27:17 ?

## 2021-12-06 NOTE — Progress Notes (Signed)
PHYLIS, JAVED ([id number]) ?Visit Report for 12/06/2021 ?Chief Complaint Document Details ?Patient Name: Date of Service: ?[name], [name] 12/06/2021 3:00 PM ?Medical Record Number: [id number] ?Patient Account Number: [id number] ?Date of Birth/Sex: Treating RN: ?11-Feb-1941 (81 y.o. F) ?Primary Care Provider: [name] Other Clinician: ?Referring Provider: ?Treating Provider/[name]: [name] ?[name] ?Weeks in Treatment: 24 ?Information Obtained from: Patient ?Chief Complaint ?07/13/2019; patient is here for review wounds on the toes of her right foot and the left fourth toe ?08/04/2020; patient is here for review of wounds at the anterior part of her right first met head/TMA site and another area on the right medial calf at a vein ?harvest site. ?Electronic Signature(s) ?Signed: 12/06/2021 5:59:50 PM By: [name] MD FACS ?Entered By: [name] on 12/06/2021 17:59:50 ?-------------------------------------------------------------------------------- ?Debridement Details ?Patient Name: Date of Service: ?[name], [name] 12/06/2021 3:00 PM ?Medical Record Number: [id number] ?Patient Account Number: [id number] ?Date of Birth/Sex: Treating RN: ?December 28, 1940 (81 y.o. 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 ?Performed By: Physician [name], MD ?Debridement Type: Debridement ?Severity of Tissue Pre Debridement: Fat layer exposed ?Level of Consciousness (Pre-procedure): Awake and Alert ?Pre-procedure Verification/Time Out Yes - 15:34 ?Taken: ?Start Time: 15:35 ?T Area Debrided (L x W): ?otal 0.4 (cm) x 0.8 (cm) = 0.32 (cm?) ?Tissue and other material debrided: ?Non-Viable, Callus, Skin: Dermis ?Level: Skin/Dermis ?Debridement Description:  Selective/Open Wound ?Instrument: Curette ?Bleeding: Minimum ?Hemostasis Achieved: Pressure ?End Time: 15:40 ?Response to Treatment: Procedure was tolerated well ?Level of Consciousness (Post- Awake and Alert ?procedure): ?Post Debridement Measurements of Total Wound ?Length: (cm) 0.4 ?Width: (cm) 0.8 ?Depth: (cm) 0.1 ?Volume: (cm?) 0.025 ?Character of Wound/Ulcer Post Debridement: Stable ?Severity of Tissue Post Debridement: Fat layer exposed ?Post Procedure Diagnosis ?Same as Pre-procedure ?Electronic Signature(s) ?Signed: 12/06/2021 5:05:04 PM By: Lorrin Jackson ?Signed: 12/06/2021 6:42:44 PM By: [name] MD FACS ?Entered By: Lorrin Jackson on 12/06/2021 15:42:19 ?-------------------------------------------------------------------------------- ?HPI Details ?Patient Name: Date of Service: ?[name], [name] 12/06/2021 3:00 PM ?Medical Record Number: [id number] ?Patient Account Number: [id number] ?Date of Birth/Sex: Treating RN: ?01/05/1941 (81 y.o. F) ?Primary Care Provider: [name] Other Clinician: ?Referring Provider: ?Treating Provider/[name]: [name] ?[name] ?Weeks in Treatment: 24 ?History of Present Illness ?HPI Description: ADMISSION ?07/13/2019 ?Patient is a 81 year old type II diabetic. She has known PAD. She has been followed by Dr. Jacqualyn Posey of podiatry for blistering areas on her toes dating back to ?04/30/2019 which at this was the left fourth toe. By 8/11 she had wounds on the right and left second toes. She underwent arterial studies by Dr. Alvester Chou on 7/31 ?that showed ABIs on the right of 0.60 TBI of 0.26 on the left ABI of 0.56 and a TBI of 0.25. By 9/10 she had ischemic-looking wounds per Dr. Earleen Newport on the ?right first, left first second and third. She has been using Medihoney and then mupirocin more recently simply Betadine. ?The patient underwent angiography by Dr. Gwenlyn Found on 9/21. On the right this showed 90 to 95% calcified distal right common femoral artery  stenosis and a 95% ?focal mid SFA stenosis followed by an 80% segmental stenosis. Noted that there was 1 vessel runoff in the foot via the peroneal. ?On the left there was an 80% left SFA, 70% mid left SFA. There was a short segment calcified CTO distal less than SFA  and above-knee popliteal artery ?reconstituting with two-vessel runoff. 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 It was felt that she had bilateral SFA disease as well as tibial vessel disease. An ?attempt was made to revascularize the left SFA but they were unable to cross because of the highly calcified nature of the lesion. 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 She has an area on the medial left fourth toe with raised horned callus on top of this. I am not certain what this represents. With regards to pain she has ?about a 2-1/2 I will claudication tolerance in the grocery store. She has some pain in night which is relieved by putting her feet down over the side of the bed. ?She is wearing Nitro-Dur patches on the top of her right foot. ?Past medical history includes type 2 diabetes with secondary PAD, neoplasm of the skin, diabetic retinopathy, carotid artery stenosis, hyperlipidemia ?hypertension. ?11/2; the last time the patient was here I spoke to Dr. Gwenlyn Found about revascularization options on the right. As I understand things currently Dr. Gwenlyn Found spoke with ?Dr. Trula Slade and ultimately the patient was taken to surgery on 10/27. She had a right iliofemoral endarterectomy with a bovine patch angioplasty. I think the ?plan now is for her to have a staged intervention on the right SFA by Dr. Alvester Chou. Per the patient's understanding Dr. Gwenlyn Found and Dr. Jacqualyn Posey are waiting to see ?when the gangrenous toes on the right foot can be amputated. ?The patient states her pain is a lot better and she is grateful for that. 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 Small area ?on the left fourth toe. She is using Betadine to the mummified areas on the right and Medihoney on the left ?08/27/2019; the last time I saw this patient I discharged her from the clinic. She had been revascularized by Dr. Trula Slade and she had a right iliofemoral ?endarterectomy with a bovine angioplasty. 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 Jacqualyn Posey of podiatry on ?08/07/2019. I note that she also had a intervention by Dr. Gwenlyn Found and he performed a directional atherectomy and drug-coated balloon angioplasty of the SFA ?and popliteal artery on the right. I am not certain of the date of Dr. Kennon Holter procedure as of the time of this dictation. She was referred back to [place] by Dr. ?Earleen Newport predominantly for follow-up of the left fourth toe. She still has sutures and stitches in the right TMA site. She states her pain is a lot better. She ?expresses concern about the condition of the amputation site at the TMA. She is on doxycycline I think prescribed by Dr. Earleen Newport. She is complaining of some ?pain at night ?12/10; I spoke to Dr. Jacqualyn Posey last week. He removed the sutures on the right foot on Monday of this week. She has the area on the left fourth toe just ?proximal to the PIP and then the right TMA site. She is still on doxycycline and has enough through next week. ?Unfortunately the TMA site does not look good at all. Both on the lateral and medial part of the incisions are areas that probe to bone. There is purulence over ?the medial part which I have cultured. We will use silver alginate. Left fourth toe looks somewhat better but there was still exposed bone ?12/17;  patient has an MRI booked for 12/30. Culture I did last week showed Pseudomonas Serratia and Enterococcus. This was purulent drainage coming out of ?the medial part of her amputation site. I use cefdinir 300 twice daily for 10 days  that started on 12/15. Her x-ray on the right showed limited evaluation for ?osteomyelitis. The findings could have been postoperative. There was subtle erosion in the distal first and distal fifth metatarsal.

## 2021-12-07 ENCOUNTER — Encounter (HOSPITAL_BASED_OUTPATIENT_CLINIC_OR_DEPARTMENT_OTHER): Payer: Medicare Other | Admitting: General Surgery

## 2021-12-07 ENCOUNTER — Other Ambulatory Visit: Payer: Self-pay

## 2021-12-07 DIAGNOSIS — E11621 Type 2 diabetes mellitus with foot ulcer: Secondary | ICD-10-CM | POA: Diagnosis not present

## 2021-12-07 DIAGNOSIS — L97512 Non-pressure chronic ulcer of other part of right foot with fat layer exposed: Secondary | ICD-10-CM | POA: Diagnosis not present

## 2021-12-07 DIAGNOSIS — E11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: Secondary | ICD-10-CM | POA: Diagnosis not present

## 2021-12-07 DIAGNOSIS — T8131XD Disruption of external operation (surgical) wound, not elsewhere classified, subsequent encounter: Secondary | ICD-10-CM | POA: Diagnosis not present

## 2021-12-07 DIAGNOSIS — L97514 Non-pressure chronic ulcer of other part of right foot with necrosis of bone: Secondary | ICD-10-CM | POA: Diagnosis not present

## 2021-12-07 DIAGNOSIS — M86671 Other chronic osteomyelitis, right ankle and foot: Secondary | ICD-10-CM | POA: Diagnosis not present

## 2021-12-07 LAB — GLUCOSE, CAPILLARY
Glucose-Capillary: 207 mg/dL — ABNORMAL HIGH (ref 70–99)
Glucose-Capillary: 99 mg/dL (ref 70–99)

## 2021-12-07 NOTE — Progress Notes (Signed)
MADALENE, MICKLER ([id number]) ?Visit Report for 12/07/2021 ?Physician Orders Details ?Patient Name: Date of Service: ?[name], [name] 12/07/2021 1:00 PM ?Medical Record Number: [id number] ?Patient Account Number: [id number] ?Date of Birth/Sex: Treating RN: ?May 28, 1941 (81 y.o. 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 12/07/2021 1:00 PM ?Medical Record Number: [id number] ?Patient Account Number: [id number] ?Date of Birth/Sex: Treating RN: ?Oct 24, 1940 (81 y.o. [name], [name] ?Primary Care Provider: [name] Other Clinician: ?Referring Provider: ?Treating Provider/[name]: [name] ?[name] ?Weeks in Treatment: 25 ?Active Problems ?ICD-10 ?Encounter ?Code Description Active Date MDM ?Diagnosis ?E11.621 Type 2 diabetes mellitus with foot ulcer 06/15/2021 No Yes ?T81.31XD Disruption of external operation (surgical) wound, not elsewhere classified, 06/15/2021 No Yes ?subsequent encounter ?L97.514 Non-pressure chronic ulcer of other part of right foot with necrosis of bone 09/20/2021 No  Yes ?E11.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 06/15/2021 No Yes ?G92.119 Other chronic osteomyelitis, right ankle and foot 10/25/2021 No Yes ?Inactive Problems ?ICD-10 ?Code Description Active Date Inactive Date ?L97.428 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 06/15/2021 06/15/2021 ?L97.528 Non-pressure chronic ulcer of other part of left foot with other specified severity 06/15/2021 06/15/2021 ?Resolved Problems ?Electronic Signature(s) ?Signed: 12/07/2021 4:42:29 PM By: [name] MD FACS ?Previous Signature: 12/07/2021 2:53:12 PM Version By: [name] EMT ?Entered By: [name] on 12/07/2021 16:42:28 ?-------------------------------------------------------------------------------- ?SuperBill Details ?Patient Name: ?Date of Service: ?[name], [name] 12/07/2021 ?Medical Record Number: [id number] ?Patient Account Number: [id number] ?Date of Birth/Sex: ?Treating RN: ?1941-02-14 (81 y.o. [name], [name] ?Primary Care Provider: [name] ?Other Clinician: ?Referring Provider: ?Treating Provider/[name]: [name] ?[name] ?Weeks in Treatment: 25 ?Diagnosis Coding ?ICD-10 Codes ?Code Description ?E11.621 Type 2 diabetes mellitus with foot ulcer ?T81.31XD Disruption of external operation (surgical) wound, not elsewhere classified, subsequent encounter ?L97.514 Non-pressure chronic ulcer of other part of right foot with necrosis of bone ?E11.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 ?Y18.563 Other chronic osteomyelitis, right ankle and foot ?Facility Procedures ?CPT4 Code: 14970263 ?Description: G0277-(Facility Use Only) HBOT full body chamber, 33mn , ICD-10 Diagnosis Description E11.621 Type 2 diabetes mellitus with foot ulcer T81.31XD Disruption of external operation (surgical) wound, not elsewhere classified, subse M86.671 Other ? 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

## 2021-12-08 ENCOUNTER — Encounter (HOSPITAL_BASED_OUTPATIENT_CLINIC_OR_DEPARTMENT_OTHER): Payer: Medicare Other | Admitting: Internal Medicine

## 2021-12-08 DIAGNOSIS — E11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: Secondary | ICD-10-CM | POA: Diagnosis not present

## 2021-12-08 DIAGNOSIS — M86671 Other chronic osteomyelitis, right ankle and foot: Secondary | ICD-10-CM | POA: Diagnosis not present

## 2021-12-08 DIAGNOSIS — I10 Essential (primary) hypertension: Secondary | ICD-10-CM | POA: Diagnosis not present

## 2021-12-08 DIAGNOSIS — Z89412 Acquired absence of left great toe: Secondary | ICD-10-CM | POA: Diagnosis not present

## 2021-12-08 DIAGNOSIS — L97514 Non-pressure chronic ulcer of other part of right foot with necrosis of bone: Secondary | ICD-10-CM

## 2021-12-08 DIAGNOSIS — Z4781 Encounter for orthopedic aftercare following surgical amputation: Secondary | ICD-10-CM | POA: Diagnosis not present

## 2021-12-08 DIAGNOSIS — I70202 Unspecified atherosclerosis of native arteries of extremities, left leg: Secondary | ICD-10-CM | POA: Diagnosis not present

## 2021-12-08 DIAGNOSIS — E11621 Type 2 diabetes mellitus with foot ulcer: Secondary | ICD-10-CM | POA: Diagnosis not present

## 2021-12-08 DIAGNOSIS — D638 Anemia in other chronic diseases classified elsewhere: Secondary | ICD-10-CM | POA: Diagnosis not present

## 2021-12-08 DIAGNOSIS — E11628 Type 2 diabetes mellitus with other skin complications: Secondary | ICD-10-CM | POA: Diagnosis not present

## 2021-12-08 DIAGNOSIS — T8131XD Disruption of external operation (surgical) wound, not elsewhere classified, subsequent encounter: Secondary | ICD-10-CM | POA: Diagnosis not present

## 2021-12-08 DIAGNOSIS — M869 Osteomyelitis, unspecified: Secondary | ICD-10-CM | POA: Diagnosis not present

## 2021-12-08 LAB — GLUCOSE, CAPILLARY
Glucose-Capillary: 182 mg/dL — ABNORMAL HIGH (ref 70–99)
Glucose-Capillary: 160 mg/dL — ABNORMAL HIGH (ref 70–99)

## 2021-12-08 NOTE — Progress Notes (Signed)
[name], [name] ([id number]) ?Visit Report for 12/08/2021 ?Arrival Information Details ?Patient Name: Date of Service: ?[name]. 12/08/2021 10:30 A M ?Medical Record Number: [id number] ?Patient Account Number: [id number] ?Date of Birth/Sex: Treating RN: ?01-Apr-1941 (81 y.o. [name], [name] ?Primary Care Ahlaya Ende: [name] ?Other Clinician: [name] ?Referring Daylee Delahoz: ?Treating Kory Rains/[name]: [name] ?[name] ?Weeks in Treatment: 25 ?Visit Information History Since Last Visit ?All ordered tests and consults were completed: Yes ?Patient Arrived: Ambulatory ?Added or deleted any medications: No ?Arrival Time: 10:22 ?Any new allergies or adverse reactions: No ?Accompanied By: None ?Had a fall or experienced change in No ?Transfer Assistance: None ?activities of daily living that may affect ?Patient Identification Verified: Yes ?risk of falls: ?Secondary Verification Process Completed: Yes ?Signs or symptoms of abuse/neglect since last visito No ?Patient Requires Transmission-Based Precautions: No ?Hospitalized since last visit: No ?Patient Has Alerts: Yes ?Implantable device outside of the clinic excluding No ?Patient Alerts: Patient on Blood Thinner cellular tissue based products placed in the center ?since last visit: ?Pain Present Now: No ?Notes ?Paper version used prior to treatment. ?Electronic Signature(s) ?Signed: 12/08/2021 2:08:33 PM By: [name] EMT ?Entered By: [name] on 12/08/2021 14:08:33 ?-------------------------------------------------------------------------------- ?Encounter Discharge Information Details ?Patient Name: Date of Service: ?[name]. 12/08/2021 10:30 A M ?Medical Record Number: [id number] ?Patient Account Number: [id number] ?Date of Birth/Sex: Treating RN: ?[birth year] (81 y.o. [name], [name] ?Primary Care Calli Bashor: [name] ?Other Clinician: [name] ?Referring Bobby Barton: ?Treating Ailsa Mireles/[name]:  [name] ?[name] ?Weeks in Treatment: 25 ?Encounter Discharge Information Items ?Discharge Condition: Stable ?Ambulatory Status: [name] ?Discharge Destination: Home ?Transportation: Other ?Accompanied By: None ?Schedule Follow-up Appointment: Yes ?Clinical Summary of Care: ?Electronic Signature(s) ?Signed: 12/08/2021 2:17:11 PM By: [name] EMT ?Entered By: [name] on 12/08/2021 14:17:11 ?-------------------------------------------------------------------------------- ?Vitals Details ?Patient Name: ?Date of Service: ?[name]. 12/08/2021 10:30 A M ?Medical Record Number: [id number] ?Patient Account Number: [id number] ?Date of Birth/Sex: ?Treating RN: ?1941-08-05 (81 y.o. [name], [name] ?Primary Care Destony Prevost: [name] ?Other Clinician: [name] ?Referring Knut Rondinelli: ?Treating Chany Woolworth/[name]: [name] ?[name] ?Weeks in Treatment: 25 ?Vital Signs ?Time Taken: 10:25 ?Temperature (??F): 97.8 ?Height (in): 64 ?Pulse (bpm): 90 ?Weight (lbs): 135 ?Respiratory Rate (breaths/min): 16 ?Body Mass Index (BMI): 23.2 ?Blood Pressure (mmHg): 128/64 ?Capillary Blood Glucose (mg/dl): 182 ?Reference Range: 80 - 120 mg / dl ?Notes ?Paper version used prior to treatment. I asked the patient if she had any problems today with her ears or sinus. She stated no. ?Electronic Signature(s) ?Signed: 12/08/2021 2:09:49 PM By: [name] EMT ?Entered By: [name] on 12/08/2021 14:09:49 ?

## 2021-12-08 NOTE — Progress Notes (Signed)
YU, CRAGUN ([id number]) ?Visit Report for 12/07/2021 ?Arrival Information Details ?Patient Name: Date of Service: ?[name], [name] 12/07/2021 1:00 PM ?Medical Record Number: [id number] ?Patient Account Number: [id number] ?Date of Birth/Sex: Treating RN: ?[birth year] (81 y.o. [name], [name] ?Primary Care Lylith Bebeau: [name] ?Other Clinician: [name] ?Referring Luzmaria Devaux: ?Treating Ivin Rosenbloom/[name]: [name] ?[name] ?Weeks in Treatment: 25 ?Visit Information History Since Last Visit ?All ordered tests and consults were completed: Yes ?Patient Arrived: [name] ?Added or deleted any medications: No ?Arrival Time: 12:24 ?Any new allergies or adverse reactions: No ?Accompanied By: self ?Had a fall or experienced change in No ?Transfer Assistance: None ?activities of daily living that may affect ?Patient Identification Verified: Yes ?risk of falls: ?Secondary Verification Process Completed: Yes ?Signs or symptoms of abuse/neglect since last visito No ?Patient Requires Transmission-Based Precautions: No ?Hospitalized since last visit: No ?Patient Has Alerts: Yes ?Implantable device outside of the clinic excluding No ?Patient Alerts: Patient on Blood Thinner cellular tissue based products placed in the center ?since last visit: ?Pain Present Now: No ?Electronic Signature(s) ?Signed: 12/08/2021 2:03:43 PM By: [name] CHT EMT BS ?, , ?Entered By: [name] on 12/07/2021 13:31:37 ?-------------------------------------------------------------------------------- ?Encounter Discharge Information Details ?Patient Name: Date of Service: ?[name], [name] 12/07/2021 1:00 PM ?Medical Record Number: [id number] ?Patient Account Number: [id number] ?Date of Birth/Sex: Treating RN: ?12-Jun-1941 (81 y.o. [name], [name] ?Primary Care Lonney Revak: [name] Other Clinician: ?Referring Shemuel Harkleroad: ?Treating Dorance Spink/[name]: [name] ?[name] ?Weeks in  Treatment: 25 ?Encounter Discharge Information Items ?Discharge Condition: Stable ?Ambulatory Status: Ambulatory ?Discharge Destination: Home ?Transportation: Private Auto ?Accompanied By: None ?Schedule Follow-up Appointment: Yes ?Clinical Summary of Care: ?Electronic Signature(s) ?Signed: 12/07/2021 3:15:26 PM By: [name] EMT ?Entered By: [name] on 12/07/2021 15:15:26 ?-------------------------------------------------------------------------------- ?Vitals Details ?Patient Name: ?Date of Service: ?[name], [name] 12/07/2021 1:00 PM ?Medical Record Number: [id number] ?Patient Account Number: [id number] ?Date of Birth/Sex: ?Treating RN: ?[birth year] (81 y.o. [name], [name] ?Primary Care Eimi Viney: [name] ?Other Clinician: [name] ?Referring Lorraina Spring: ?Treating Therin Vetsch/[name]: [name] ?[name] ?Weeks in Treatment: 25 ?Vital Signs ?Time Taken: 12:33 ?Temperature (??F): 97.7 ?Height (in): 64 ?Pulse (bpm): 82 ?Weight (lbs): 135 ?Respiratory Rate (breaths/min): 16 ?Body Mass Index (BMI): 23.2 ?Blood Pressure (mmHg): 178/60 ?Capillary Blood Glucose (mg/dl): 207 ?Reference Range: 80 - 120 mg / dl ?Airway ?Pulse Oximetry (%): 100 ?Electronic Signature(s) ?Signed: 12/08/2021 2:03:43 PM By: [name] CHT EMT BS ?, , ?Entered By: [name] on 12/07/2021 13:32:12 ?

## 2021-12-08 NOTE — Progress Notes (Addendum)
[name], [name] ([id number]) ?Visit Report for 12/08/2021 ?HBO Details ?Patient Name: Date of Service: ?[name], [name] 12/08/2021 10:30 A M ?Medical Record Number: [id number] ?Patient Account Number: [id number] ?Date of Birth/Sex: Treating RN: ?Apr 04, 1941 (81 y.o. 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 ?HBO Treatment Details ?Treatment Number: 22 ?Patient Type: Outpatient ?Chamber Type: Monoplace ?Chamber Serial #: [id number] ?Treatment Protocol: 2.0 ATA with 90 minutes oxygen, and no air breaks ?Treatment Details ?Compression Rate Down: 2.0 psi / minute ?De-Compression Rate Up: 2.0 psi / minute ?Air ?breaks ?and ?breathing Decompress Decompress ?Compress Tx Pressure ?Begins Reached periods Begins Ends ?(leave ?unused ?spaces ?blank) ?Chamber Pressure (ATA 1 2 ------2 1 ?) ?Clock Time (24 hr) 11:01 11:10 - - - - - - 12:40 12:47 ?Treatment Length: 106 (minutes) ?Treatment Segments: 4 ?Vital Signs ?Capillary Blood Glucose Reference Range: 80 - 120 mg / dl ?HBO Diabetic Blood Glucose Intervention Range: <131 mg/dl or >249 mg/dl ?Time Vitals Blood Respiratory Capillary Blood Glucose Pulse Action ?Type: ?Pulse: Temperature: ?Taken: ?Pressure: ?Rate: ?Glucose (mg/dl): ?Meter #: Oximetry (%) Taken: ?Pre 10:25 128/64 90 16 97.8 182 ?Post 12:53 151/83 79 16 97.8 160 ?Treatment Response ?Treatment Toleration: Well ?Treatment Completion Status: Treatment Completed without Adverse Event ?Additional Procedure  Documentation ?Tissue Sevierity: Fat layer exposed ?Physician HBO Attestation: ?I certify that I supervised this HBO treatment in accordance with Medicare ?guidelines. A trained emergency response team is readily available per Yes ?hospital policies and procedures. ?Continue HBOT as ordered. Yes ?Electronic Signature(s) ?Signed: 12/11/2021 10:41:02 AM By: [name] DO ?Previous Signature: 12/08/2021 2:15:08 PM Version By: [name] EMT ?Entered By: [name] on 12/11/2021 10:40:14 ?-------------------------------------------------------------------------------- ?HBO Safety Checklist Details ?Patient Name: ?Date of Service: ?[name], [name] 12/08/2021 10:30 A M ?Medical Record Number: [id number] ?Patient Account Number: [id number] ?Date of Birth/Sex: ?Treating RN: ?[birth year] (81 y.o. [name], [name] ?Primary Care Shalana Jardin: [name] ?Other Clinician: [name] ?Referring Tamera Pingley: ?Treating Elleni Mozingo/[name]: [name] ?[name] ?Weeks in Treatment: 25 ?HBO Safety Checklist Items ?Safety Checklist ?Consent Form Signed ?Patient voided / foley secured and emptied ?When did you last eato 0850 ?Last dose of injectable or oral agent 0830 ?Ostomy pouch emptied and vented if applicable ?NA ?All implantable devices assessed, documented and approved ?NA ?Intravenous access site secured and place ?Valuables secured ?Linens and cotton and cotton/polyester blend (less than 51% polyester) ?Personal oil-based products / skin lotions / body lotions removed ?Wigs or hairpieces removed ?Smoking or tobacco materials removed ?Books / newspapers / magazines / loose paper removed ?Cologne, aftershave, perfume and deodorant removed ?Jewelry removed (may wrap wedding band) ?NA ?Make-up removed ?Hair care products removed ?Battery operated devices (external) removed ?Heating patches and chemical warmers removed ?Titanium eyewear removed ?NA ?Nail polish cured greater than 10 hours Saturday  12/02/2021 ?Casting material cured greater than 10 hours ?NA ?Hearing aids removed ?NA ?Loose dentures or partials removed ?NA ?Prosthetics have been removed ?NA ?Patient demonstrates correct use of air break device (if applicable) ?Patient concerns have been addressed ?Patient grounding bracelet on and cord attached to chamber ?Specifics for Inpatients (complete in addition to above) ?Medication  sheet sent with patient ?NA ?Intravenous medications needed or due during therapy sent with patient ?NA ?Drainage tubes (e.g. nasogastric tube or chest tube secured and vented) ?NA ?Endotracheal or Tracheotomy tube secured ?NA ?Cuff deflated of air and inflated with saline ?NA ?Airway suctioned ?NA ?Notes ?Paper version used prior to treatment. ?Electronic Signature(s) ?Signed: 12/08/2021 2:12:28 PM By: [name] EMT ?Entered By: [name] on 12/08/2021 14:12:28 ?

## 2021-12-08 NOTE — Progress Notes (Addendum)
[name], [name] ([id number]) ?Visit Report for 12/07/2021 ?HBO Details ?Patient Name: Date of Service: ?[name], [name] 12/07/2021 1:00 PM ?Medical Record Number: [id number] ?Patient Account Number: [id number] ?Date of Birth/Sex: Treating RN: ?1941-04-27 (81 y.o. 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 ?HBO Treatment Details ?Treatment Number: 21 ?Patient Type: Outpatient ?Chamber Type: Monoplace ?Chamber Serial #: [id number] ?Treatment Protocol: 2.0 ATA with 90 minutes oxygen, and no air breaks ?Treatment Details ?Compression Rate Down: 2.0 psi / minute ?De-Compression Rate Up: 2.0 psi / minute ?Air ?breaks ?and ?breathing Decompress Decompress ?Compress Tx Pressure ?Begins Reached periods Begins Ends ?(leave ?unused ?spaces ?blank) ?Chamber Pressure (ATA 1 2 ------2 1 ?) ?Clock Time (24 hr) 12:48 12:57 - - - - - - 14:27 14:34 ?Treatment Length: 106 (minutes) ?Treatment Segments: 4 ?Vital Signs ?Capillary Blood Glucose Reference Range: 80 - 120 mg / dl ?HBO Diabetic Blood Glucose Intervention Range: <131 mg/dl or >249 mg/dl ?Type: Time Vitals Blood Pulse: Respiratory Temperature: Capillary Blood Glucose Pulse Action ?Taken: ?Pressure: ?Rate: ?Glucose (mg/dl): Meter #: Oximetry (%) Taken: ?Pre 12:33 178/60 82 16 97.7 207 100 ?Post 14:41 153/52 74 16 98.2 99 Patient given 1 Glucerna to drink. ?Treatment Response ?Treatment Toleration: Well ?Treatment Completion Status: Treatment Completed without Adverse  Event ?Treatment Notes ?Paper version used during the treatment. THe patient CBG was 99 after treatment. The patient was given 1 Glucerna to drink. ?Additional Procedure Documentation ?Tissue Sevierity: Fat layer exposed ?Physician HBO Attestation: ?I certify that I supervised this HBO treatment in accordance with Medicare ?guidelines. A trained emergency response team is readily available per Yes ?hospital policies and procedures. ?Continue HBOT as ordered. Yes ?Electronic Signature(s) ?Signed: 12/08/2021 2:20:39 PM By: [name] EMT ?Signed: 12/11/2021 8:03:00 AM By: [name] MD FACS ?Previous Signature: 12/07/2021 4:42:11 PM Version By: [name] MD FACS ?Previous Signature: 12/07/2021 2:52:19 PM Version By: [name] EMT ?Entered By: [name] on 12/08/2021 14:20:39 ?-------------------------------------------------------------------------------- ?HBO Safety Checklist Details ?Patient Name: ?Date of Service: ?[name], [name] 12/07/2021 1:00 PM ?Medical Record Number: [id number] ?Patient Account Number: [id number] ?Date of Birth/Sex: ?Treating RN: ?[birth year] (81 y.o. [name], [name] ?Primary Care Modean Mccullum: [name] ?Other Clinician: [name] ?Referring Rollie Hynek: ?Treating Abby Tucholski/[name]: [name] ?[name] ?Weeks in Treatment: 25 ?HBO Safety Checklist Items ?Safety Checklist ?Consent Form Signed ?Patient voided / foley secured and emptied ?When did you last eato 0830 ?Last dose of injectable or oral agent 0900 ?Ostomy pouch emptied and vented if applicable ?NA ?All implantable devices assessed, documented and approved ?NA ?Intravenous access site secured and place Right Forearm PICC ?Valuables secured ?Linens and cotton and cotton/polyester blend (less than 51% polyester) ?Personal oil-based products / skin lotions / body lotions removed ?Wigs or hairpieces removed Patient removed item. ?Smoking or tobacco materials removed ?NA ?Books /  newspapers / magazines / loose paper removed ?Cologne, aftershave, perfume and deodorant removed ?Jewelry removed (may wrap wedding band) ?Make-up removed ?Hair care products removed ?Battery operated devices (external) removed ?Heating patches and chemical warmers removed ?Titanium eyewear removed ?Nail polish cured greater than 10 hours ?Casting material cured greater than  10 hours ?NA ?Hearing aids removed ?NA ?Loose dentures or partials removed ?NA ?Prosthetics have been removed ?NA ?Patient demonstrates correct use of air break device (if applicable) ?Patient concerns have been addressed ?Patient grounding bracelet on and cord attached to chamber ?Specifics for Inpatients (complete in addition to above) ?Medication sheet sent with patient ?NA ?Intravenous medications needed or due during therapy sent with patient ?NA ?Drainage tubes (e.g. nasogastric tube or chest tube secured and vented) ?NA ?Endotracheal or Tracheotomy tube secured ?NA ?Cuff deflated of air and inflated with saline ?NA ?Airway suctioned ?NA ?Notes ?Paper version used prior to treatment. ?Electronic Signature(s) ?Signed: 12/08/2021 2:03:43 PM By: [name] CHT EMT BS ?, , ?Entered By: [name] on 12/07/2021 13:33:36 ?

## 2021-12-11 ENCOUNTER — Encounter (HOSPITAL_BASED_OUTPATIENT_CLINIC_OR_DEPARTMENT_OTHER): Payer: Medicare Other | Admitting: Internal Medicine

## 2021-12-11 ENCOUNTER — Other Ambulatory Visit: Payer: Self-pay

## 2021-12-11 DIAGNOSIS — M86671 Other chronic osteomyelitis, right ankle and foot: Secondary | ICD-10-CM | POA: Diagnosis not present

## 2021-12-11 DIAGNOSIS — Z4781 Encounter for orthopedic aftercare following surgical amputation: Secondary | ICD-10-CM | POA: Diagnosis not present

## 2021-12-11 DIAGNOSIS — T8131XD Disruption of external operation (surgical) wound, not elsewhere classified, subsequent encounter: Secondary | ICD-10-CM | POA: Diagnosis not present

## 2021-12-11 DIAGNOSIS — I10 Essential (primary) hypertension: Secondary | ICD-10-CM | POA: Diagnosis not present

## 2021-12-11 DIAGNOSIS — Z89412 Acquired absence of left great toe: Secondary | ICD-10-CM | POA: Diagnosis not present

## 2021-12-11 DIAGNOSIS — E11628 Type 2 diabetes mellitus with other skin complications: Secondary | ICD-10-CM | POA: Diagnosis not present

## 2021-12-11 DIAGNOSIS — L97514 Non-pressure chronic ulcer of other part of right foot with necrosis of bone: Secondary | ICD-10-CM | POA: Diagnosis not present

## 2021-12-11 DIAGNOSIS — E11621 Type 2 diabetes mellitus with foot ulcer: Secondary | ICD-10-CM | POA: Diagnosis not present

## 2021-12-11 DIAGNOSIS — D638 Anemia in other chronic diseases classified elsewhere: Secondary | ICD-10-CM | POA: Diagnosis not present

## 2021-12-11 DIAGNOSIS — I70202 Unspecified atherosclerosis of native arteries of extremities, left leg: Secondary | ICD-10-CM | POA: Diagnosis not present

## 2021-12-11 DIAGNOSIS — E11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: Secondary | ICD-10-CM | POA: Diagnosis not present

## 2021-12-11 LAB — GLUCOSE, CAPILLARY
Glucose-Capillary: 176 mg/dL — ABNORMAL HIGH (ref 70–99)
Glucose-Capillary: 129 mg/dL — ABNORMAL HIGH (ref 70–99)

## 2021-12-11 NOTE — Progress Notes (Signed)
[name], [name] ([id number]) ?Visit Report for 12/08/2021 ?Problem List Details ?Patient Name: Date of Service: ?[name], [name] 12/08/2021 10:30 A M ?Medical Record Number: [id number] ?Patient Account Number: [id number] ?Date of Birth/Sex: Treating RN: ?06-28-1941 (81 y.o. 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 06/15/2021 No Yes ?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, right ankle and foot 10/25/2021 No Yes ?Inactive Problems ?ICD-10 ?Code Description Active Date Inactive Date ?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 12/08/2021 ?Medical Record Number: [id number] ?Patient Account Number: [id number] ?Date of Birth/Sex: ?Treating RN: ?1941-01-16 (81 y.o. 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

## 2021-12-12 ENCOUNTER — Encounter (HOSPITAL_BASED_OUTPATIENT_CLINIC_OR_DEPARTMENT_OTHER): Payer: Medicare Other | Admitting: Internal Medicine

## 2021-12-12 DIAGNOSIS — L97514 Non-pressure chronic ulcer of other part of right foot with necrosis of bone: Secondary | ICD-10-CM

## 2021-12-12 DIAGNOSIS — Z89412 Acquired absence of left great toe: Secondary | ICD-10-CM | POA: Diagnosis not present

## 2021-12-12 DIAGNOSIS — I10 Essential (primary) hypertension: Secondary | ICD-10-CM | POA: Diagnosis not present

## 2021-12-12 DIAGNOSIS — Z7982 Long term (current) use of aspirin: Secondary | ICD-10-CM | POA: Diagnosis not present

## 2021-12-12 DIAGNOSIS — Z452 Encounter for adjustment and management of vascular access device: Secondary | ICD-10-CM | POA: Diagnosis not present

## 2021-12-12 DIAGNOSIS — E11621 Type 2 diabetes mellitus with foot ulcer: Secondary | ICD-10-CM

## 2021-12-12 DIAGNOSIS — Z7984 Long term (current) use of oral hypoglycemic drugs: Secondary | ICD-10-CM | POA: Diagnosis not present

## 2021-12-12 DIAGNOSIS — E11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: Secondary | ICD-10-CM | POA: Diagnosis not present

## 2021-12-12 DIAGNOSIS — H409 Unspecified glaucoma: Secondary | ICD-10-CM | POA: Diagnosis not present

## 2021-12-12 DIAGNOSIS — E785 Hyperlipidemia, unspecified: Secondary | ICD-10-CM | POA: Diagnosis not present

## 2021-12-12 DIAGNOSIS — T8131XD Disruption of external operation (surgical) wound, not elsewhere classified, subsequent encounter: Secondary | ICD-10-CM | POA: Diagnosis not present

## 2021-12-12 DIAGNOSIS — M86671 Other chronic osteomyelitis, right ankle and foot: Secondary | ICD-10-CM | POA: Diagnosis not present

## 2021-12-12 DIAGNOSIS — K219 Gastro-esophageal reflux disease without esophagitis: Secondary | ICD-10-CM | POA: Diagnosis not present

## 2021-12-12 DIAGNOSIS — Z792 Long term (current) use of antibiotics: Secondary | ICD-10-CM | POA: Diagnosis not present

## 2021-12-12 DIAGNOSIS — M6281 Muscle weakness (generalized): Secondary | ICD-10-CM | POA: Diagnosis not present

## 2021-12-12 DIAGNOSIS — I70202 Unspecified atherosclerosis of native arteries of extremities, left leg: Secondary | ICD-10-CM | POA: Diagnosis not present

## 2021-12-12 DIAGNOSIS — E11628 Type 2 diabetes mellitus with other skin complications: Secondary | ICD-10-CM | POA: Diagnosis not present

## 2021-12-12 DIAGNOSIS — Z89422 Acquired absence of other left toe(s): Secondary | ICD-10-CM | POA: Diagnosis not present

## 2021-12-12 DIAGNOSIS — Z4781 Encounter for orthopedic aftercare following surgical amputation: Secondary | ICD-10-CM | POA: Diagnosis not present

## 2021-12-12 DIAGNOSIS — U071 COVID-19: Secondary | ICD-10-CM | POA: Diagnosis not present

## 2021-12-12 DIAGNOSIS — R262 Difficulty in walking, not elsewhere classified: Secondary | ICD-10-CM | POA: Diagnosis not present

## 2021-12-12 DIAGNOSIS — D638 Anemia in other chronic diseases classified elsewhere: Secondary | ICD-10-CM | POA: Diagnosis not present

## 2021-12-12 DIAGNOSIS — Z7901 Long term (current) use of anticoagulants: Secondary | ICD-10-CM | POA: Diagnosis not present

## 2021-12-12 LAB — GLUCOSE, CAPILLARY
Glucose-Capillary: 201 mg/dL — ABNORMAL HIGH (ref 70–99)
Glucose-Capillary: 141 mg/dL — ABNORMAL HIGH (ref 70–99)

## 2021-12-12 NOTE — Progress Notes (Signed)
LESHEA, JAGGERS ([id number]) ?Visit Report for 12/11/2021 ?HBO Details ?Patient Name: Date of Service: ?[name], [name] 12/11/2021 1:00 PM ?Medical Record Number: [id number] ?Patient Account Number: [id number] ?Date of Birth/Sex: Treating RN: ?Aug 15, 1941 (81 y.o. 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 ?HBO Treatment Details ?Treatment Number: 02 ?Patient Type: Outpatient ?Chamber Type: Monoplace ?Chamber Serial #: [id number] ?Treatment Protocol: 2.0 ATA with 90 minutes oxygen, and no air breaks ?Treatment Details ?Compression Rate Down: 2.0 psi / minute ?De-Compression Rate Up: 2.0 psi / minute ?Air ?breaks ?and ?breathing Decompress Decompress ?Compress Tx Pressure ?Begins Reached periods Begins Ends ?(leave ?unused ?spaces ?blank) ?Chamber Pressure (ATA 1 2 ------2 1 ?) ?Clock Time (24 hr) 12:53 13:04 - - - - - - 14:35 14:44 ?Treatment Length: 111 (minutes) ?Treatment Segments: 4 ?Vital Signs ?Capillary Blood Glucose Reference Range: 80 - 120 mg / dl ?HBO Diabetic Blood Glucose Intervention Range: <131 mg/dl or >249 mg/dl ?Type: Time Vitals Blood Pulse: Respiratory Temperature: Capillary Blood Glucose Pulse Action ?Taken: Pressure: Rate: Glucose (mg/dl): Meter #: Oximetry (%) Taken: ?Pre 12:44 170/62 80 16 97.5 176 2 100 ?Post 14:47 172/58 83 16 98.2 129 2 Patient discharged per [facility] protocol. ?Treatment Response ?Treatment Toleration: Well ?Treatment Completion Status: Treatment Completed without Adverse  Event ?Additional Procedure Documentation ?Tissue Sevierity: Fat layer exposed ?Physician HBO Attestation: ?I certify that I supervised this HBO treatment in accordance with Medicare ?guidelines. A trained emergency response team is readily available per Yes ?hospital policies and procedures. ?Continue HBOT as ordered. Yes ?Electronic Signature(s) ?Signed: 12/12/2021 10:16:49 AM By: [name] DO ?Previous Signature: 12/11/2021 5:06:33 PM Version By: [name] CHT EMT BS ?, , ?Entered By: [name] on 12/12/2021 10:15:16 ?-------------------------------------------------------------------------------- ?HBO Safety Checklist Details ?Patient Name: ?Date of Service: ?[name], [name] 12/11/2021 1:00 PM ?Medical Record Number: [id number] ?Patient Account Number: [id number] ?Date of Birth/Sex: ?Treating RN: ?[birth year] (81 y.o. [name], [name] ?Primary Care Jerilee Space: [name] ?Other Clinician: [name] ?Referring Laconya Clere: ?Treating Demarcus Thielke/[name]: [name] ?[name] ?Weeks in Treatment: 25 ?HBO Safety Checklist Items ?Safety Checklist ?Consent Form Signed ?Patient voided / foley secured and emptied ?When did you last eato 1200 ?Last dose of injectable or oral agent 0830 ?Ostomy pouch emptied and vented if applicable ?NA ?All implantable devices assessed, documented and approved ?NA ?Intravenous access site secured and place ?NA PICC Removed since last visit. ?Valuables secured ?Linens and cotton and cotton/polyester blend (less than 51% polyester) ?Personal oil-based products / skin lotions / body lotions removed ?Wigs or hairpieces removed Patient removed item ?Smoking or tobacco materials removed ?NA ?Books / newspapers / magazines / loose paper removed ?Cologne, aftershave, perfume and deodorant removed ?Jewelry removed (may wrap wedding band) ?Make-up removed ?Hair care products removed ?Battery operated devices (external) removed ?Heating patches and  chemical warmers removed ?Titanium eyewear removed ?Nail polish cured greater than 10 hours ?Casting material cured greater than 10 hours ?NA ?Hearing aids removed ?NA ?Loose dentures or partials removed ?NA ?Prosthetics have been removed ?NA ?Patient demonstrates correct use of air break device (if applicable) ?Patient concerns have been addressed ?Patient grounding bracelet on  and cord attached to chamber ?Specifics for Inpatients (complete in addition to above) ?Medication sheet sent with patient ?NA ?Intravenous medications needed or due during therapy sent with patient ?NA ?Drainage tubes (e.g. nasogastric tube or chest tube secured and vented) ?NA ?Endotracheal or Tracheotomy tube secured ?NA ?Cuff deflated of air and inflated with saline ?NA ?Airway suctioned ?NA ?Notes ?Paper version used prior to treatment. ?Electronic Signature(s) ?Signed: 12/12/2021 4:12:57 PM By: [name] CHT EMT BS ?, , ?Entered By: [name] on 12/11/2021 13:39:35 ?

## 2021-12-12 NOTE — Progress Notes (Signed)
KHADEEJA, ELDEN ([id number]) ?Visit Report for 12/12/2021 ?HBO Details ?Patient Name: Date of Service: ?[name], [name] 12/12/2021 1:00 PM ?Medical Record Number: [id number] ?Patient Account Number: [id number] ?Date of Birth/Sex: Treating RN: ?Jun 05, 1941 (81 y.o. 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 ?HBO Treatment Details ?Treatment Number: 24 ?Patient Type: Outpatient ?Chamber Type: Monoplace ?Chamber Serial #: [id number] ?Treatment Protocol: 2.0 ATA with 90 minutes oxygen, and no air breaks ?Treatment Details ?Compression Rate Down: 2.0 psi / minute ?De-Compression Rate Up: 2.0 psi / minute ?Air ?breaks ?and ?breathing Decompress Decompress ?Compress Tx Pressure ?Begins Reached periods Begins Ends ?(leave ?unused ?spaces ?blank) ?Chamber Pressure (ATA 1 2 ------2 1 ?) ?Clock Time (24 hr) 12:55 13:04 - - - - - - 14:34 14:41 ?Treatment Length: 106 (minutes) ?Treatment Segments: 4 ?Vital Signs ?Capillary Blood Glucose Reference Range: 80 - 120 mg / dl ?HBO Diabetic Blood Glucose Intervention Range: <131 mg/dl or >249 mg/dl ?Time Vitals Blood Respiratory Capillary Blood Glucose Pulse Action ?Type: ?Pulse: Temperature: ?Taken: ?Pressure: ?Rate: ?Glucose (mg/dl): ?Meter #: Oximetry (%) Taken: ?Pre 12:50 162/58 79 16 97.7 201 2 99 ?Post 14:46 142/62 80 16 97.6 141 2 100 ?Treatment Response ?Treatment Toleration: Well ?Treatment Completion Status: Treatment Completed without Adverse Event ?Additional Procedure  Documentation ?Tissue Sevierity: Fat layer exposed ?Physician HBO Attestation: ?I certify that I supervised this HBO treatment in accordance with Medicare ?guidelines. A trained emergency response team is readily available per Yes ?hospital policies and procedures. ?Continue HBOT as ordered. Yes ?Electronic Signature(s) ?Signed: 12/12/2021 4:47:25 PM By: [name] DO ?Previous Signature: 12/12/2021 4:11:57 PM Version By: [name] CHT EMT BS ?, , ?Entered By: [name] on 12/12/2021 16:46:34 ?-------------------------------------------------------------------------------- ?HBO Safety Checklist Details ?Patient Name: ?Date of Service: ?[name], [name] 12/12/2021 1:00 PM ?Medical Record Number: [id number] ?Patient Account Number: [id number] ?Date of Birth/Sex: ?Treating RN: ?[birth year] (81 y.o. [name], [name] ?Primary Care Cedrik Heindl: [name] ?Other Clinician: [name] ?Referring Xaiver Roskelley: ?Treating Merville Hijazi/[name]: [name] ?[name] ?Weeks in Treatment: 25 ?HBO Safety Checklist Items ?Safety Checklist ?Consent Form Signed ?Patient voided / foley secured and emptied ?When did you last eato 0845 ?Last dose of injectable or oral agent 0845 ?Ostomy pouch emptied and vented if applicable ?NA ?All implantable devices assessed, documented and approved ?NA ?Intravenous access site secured and place ?NA ?Valuables secured ?Linens and cotton and cotton/polyester blend (less than 51% polyester) ?Personal oil-based products / skin lotions / body lotions removed ?Wigs or hairpieces removed Patient removed item. ?Smoking or tobacco materials removed ?NA ?Books / newspapers / magazines / loose paper removed ?Cologne, aftershave, perfume and deodorant removed ?Jewelry removed (may wrap wedding band) ?Make-up removed ?Hair care products removed ?Battery operated devices (external) removed ?Heating patches and chemical warmers removed ?Titanium eyewear removed ?Nail polish  cured greater than 10 hours ?Casting material cured greater than 10 hours ?NA ?Hearing aids removed ?NA ?Loose dentures or partials removed ?NA ?Prosthetics have been removed ?NA ?Patient demonstrates correct use of air break device (if applicable) ?Patient concerns have been addressed ?Patient grounding bracelet on and cord attached to chamber ?Specifics for  Inpatients (complete in addition to above) ?Medication sheet sent with patient ?NA ?Intravenous medications needed or due during therapy sent with patient ?NA ?Drainage tubes (e.g. nasogastric tube or chest tube secured and vented) ?NA ?Endotracheal or Tracheotomy tube secured ?NA ?Cuff deflated of air and inflated with saline ?NA ?Airway suctioned ?NA ?Notes ?Paper version used prior to treatment. ?Electronic Signature(s) ?Signed: 12/12/2021 4:12:57 PM By: [name] CHT EMT BS ?, , ?Entered By: [name] on 12/12/2021 13:41:23 ?

## 2021-12-12 NOTE — Progress Notes (Signed)
CHEVY, VIRGO ([id number]) ?Visit Report for 12/11/2021 ?SuperBill Details ?Patient Name: Date of Service: ?[name], [name] 12/11/2021 ?Medical Record Number: [id number] ?Patient Account Number: [id number] ?Date of Birth/Sex: Treating RN: ?[birth year] (81 y.o. 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, right ankle and foot ?Facility Procedures ?CPT4 Code Description Modifier Quantity ?63875643 G0277-(Facility Use Only) HBOT full body chamber, 82mn ?, 4 ?ICD-10 Diagnosis Description ?E11.621 Type 2 diabetes mellitus with foot ulcer ?L97.514 Non-pressure chronic ulcer of other part of right foot with necrosis of bone ?MP29.518Other chronic osteomyelitis, right ankle and foot ?T81.31XD Disruption of external operation (surgical) wound, not elsewhere classified, subsequent encounter ?Physician Procedures ?Quantity ?CPT4 Code Description Modifier ?68416606930160- WC PHYS HYPERBARIC OXYGEN THERAPY 1 ?ICD-10 Diagnosis Description ?E11.621 Type 2 diabetes mellitus with foot ulcer ?L97.514 Non-pressure chronic ulcer of other part of right foot with necrosis of bone ?MF09.323Other chronic osteomyelitis, right ankle and foot ?T81.31XD Disruption of external operation (surgical) wound, not elsewhere classified, subsequent encounter ?Electronic Signature(s) ?Signed: 12/11/2021 5:07:06 PM By: SDonavan BurnetCHT EMT BS ?, , ?Signed: 12/12/2021 10:16:49 AM  By: HKalman ShanDO ?Entered By: SDonavan Burneton 12/11/2021 17:07:05 ?

## 2021-12-12 NOTE — Progress Notes (Signed)
[name], [name] ([id number]) ?Visit Report for 12/12/2021 ?Arrival Information Details ?Patient Name: Date of Service: ?[name], [name] 12/12/2021 1:00 PM ?Medical Record Number: [id number] ?Patient Account Number: [id number] ?Date of Birth/Sex: Treating RN: ?[birth year] (81 y.o. [name], [name] ?Primary Care Jelissa Espiritu: [name] ?Other Clinician: [name] ?Referring Lumir Demetriou: ?Treating Vergene Marland/[name]: [name] ?[name] ?Weeks in Treatment: 25 ?Visit Information History Since Last Visit ?All ordered tests and consults were completed: Yes ?Patient Arrived: [name] ?Added or deleted any medications: No ?Arrival Time: 12:42 ?Any new allergies or adverse reactions: No ?Accompanied By: self ?Had a fall or experienced change in No ?Transfer Assistance: None ?activities of daily living that may affect ?Patient Identification Verified: Yes ?risk of falls: ?Secondary Verification Process Completed: Yes ?Signs or symptoms of abuse/neglect since last visito No ?Patient Requires Transmission-Based Precautions: No ?Hospitalized since last visit: No ?Patient Has Alerts: Yes ?Implantable device outside of the clinic excluding No ?Patient Alerts: Patient on Blood Thinner cellular tissue based products placed in the center ?since last visit: ?Pain Present Now: No ?Electronic Signature(s) ?Signed: 12/12/2021 4:12:57 PM By: [name] CHT EMT BS ?, , ?Entered By: [name] on 12/12/2021 13:37:29 ?-------------------------------------------------------------------------------- ?Encounter Discharge Information Details ?Patient Name: Date of Service: ?[name], [name] 12/12/2021 1:00 PM ?Medical Record Number: [id number] ?Patient Account Number: [id number] ?Date of Birth/Sex: Treating RN: ?May 31, 1941 (81 y.o. [name], [name] ?Primary Care Kerilyn Cortner: [name] ?Other Clinician: [name] ?Referring Zidan Helget: ?Treating Tagen Milby/[name]: [name] ?[name] ?Weeks in Treatment: 25 ?Encounter Discharge Information Items ?Discharge Condition: Stable ?Ambulatory Status: [name] ?Discharge Destination: Home ?Transportation: Private Auto ?Accompanied By: self ?Schedule Follow-up Appointment: No ?Clinical Summary of Care: ?Electronic Signature(s) ?Signed: 12/12/2021 4:16:10 PM By: [name] CHT EMT BS ?, , ?Entered By: [name] on 12/12/2021 16:16:10 ?-------------------------------------------------------------------------------- ?Vitals Details ?Patient Name: ?Date of Service: ?[name], [name] 12/12/2021 1:00 PM ?Medical Record Number: [id number] ?Patient Account Number: [id number] ?Date of Birth/Sex: ?Treating RN: ?[birth year] (81 y.o. [name], [name] ?Primary Care Alazne Quant: [name] ?Other Clinician: [name] ?Referring Courtland Coppa: ?Treating Alvino Lechuga/[name]: [name] ?[name] ?Weeks in Treatment: 25 ?Vital Signs ?Time Taken: 12:50 ?Temperature (??F): 97.7 ?Height (in): 64 ?Pulse (bpm): 79 ?Weight (lbs): 135 ?Respiratory Rate (breaths/min): 16 ?Body Mass Index (BMI): 23.2 ?Blood Pressure (mmHg): 162/58 ?Capillary Blood Glucose (mg/dl): 201 ?Reference Range: 80 - 120 mg / dl ?Airway ?Pulse Oximetry (%): 99 ?Electronic Signature(s) ?Signed: 12/12/2021 4:12:57 PM By: [name] CHT EMT BS ?, , ?Entered By: [name] on 12/12/2021 13:40:12 ?

## 2021-12-12 NOTE — Progress Notes (Signed)
[name], [name] ([id number]) ?Visit Report for 12/12/2021 ?SuperBill Details ?Patient Name: Date of Service: ?[name], [name] 12/12/2021 ?Medical Record Number: [id number] ?Patient Account Number: [id number] ?Date of Birth/Sex: Treating RN: ?September 07, 1941 (81 y.o. [name], [name] ?Primary Care Provider: [name] ?Other Clinician: [name] ?Referring Provider: ?Treating Provider/[name]: [name] ?[name] ?Weeks in Treatment: 25 ?Diagnosis Coding ?ICD-10 Codes ?Code Description ?E11.621 Type 2 diabetes mellitus with foot ulcer ?T81.31XD Disruption of external operation (surgical) wound, not elsewhere classified, subsequent encounter ?L97.514 Non-pressure chronic ulcer of other part of right foot with necrosis of bone ?E11.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 ?M21.115 Other chronic osteomyelitis, right ankle and foot ?Facility Procedures ?CPT4 Code Description Modifier Quantity ?52080223 G0277-(Facility Use Only) HBOT full body chamber, 66mn ?, 4 ?ICD-10 Diagnosis Description ?E11.621 Type 2 diabetes mellitus with foot ulcer ?L97.514 Non-pressure chronic ulcer of other part of right foot with necrosis of bone ?MV61.224Other chronic osteomyelitis, right ankle and foot ?T81.31XD Disruption of external operation (surgical) wound, not elsewhere classified, subsequent encounter ?Physician Procedures ?Quantity ?CPT4 Code Description Modifier ?[id number]- WC PHYS HYPERBARIC OXYGEN THERAPY 1 ?ICD-10 Diagnosis Description ?E11.621 Type 2 diabetes mellitus with foot ulcer ?L97.514 Non-pressure chronic ulcer of other part of right foot with necrosis of bone ?MT11.735Other chronic osteomyelitis, right ankle and foot ?T81.31XD Disruption of external operation (surgical) wound, not elsewhere classified, subsequent encounter ?Electronic Signature(s) ?Signed: 12/12/2021 4:12:23 PM By: SDonavan BurnetCHT EMT BS ?, , ?Signed: 12/12/2021 4:47:25 PM By:  HKalman ShanDO ?Entered By: SDonavan Burneton 12/12/2021 16:12:22 ?

## 2021-12-12 NOTE — Progress Notes (Signed)
DAISEE, CENTNER ([id number]) ?Visit Report for 12/11/2021 ?Arrival Information Details ?Patient Name: Date of Service: ?[name], [name] 12/11/2021 1:00 PM ?Medical Record Number: [id number] ?Patient Account Number: [id number] ?Date of Birth/Sex: Treating RN: ?22-Jan-1941 (81 y.o. [name], [name] ?Primary Care Courteny Egler: [name] ?Other Clinician: [name] ?Referring Grettel Rames: ?Treating Jiali Linney/[name]: [name] ?[name] ?Weeks in Treatment: 25 ?Visit Information History Since Last Visit ?All ordered tests and consults were completed: Yes ?Patient Arrived: [name] ?Added or deleted any medications: No ?Arrival Time: 12:30 ?Any new allergies or adverse reactions: No ?Accompanied By: self ?Had a fall or experienced change in No ?Transfer Assistance: None ?activities of daily living that may affect ?Patient Identification Verified: Yes ?risk of falls: ?Secondary Verification Process Completed: Yes ?Signs or symptoms of abuse/neglect since last visito No ?Patient Requires Transmission-Based Precautions: No ?Hospitalized since last visit: No ?Patient Has Alerts: Yes ?Implantable device outside of the clinic excluding No ?Patient Alerts: Patient on Blood Thinner cellular tissue based products placed in the center ?since last visit: ?Pain Present Now: No ?Electronic Signature(s) ?Signed: 12/12/2021 4:12:57 PM By: [name] CHT EMT BS ?, , ?Entered By: [name] on 12/11/2021 13:37:30 ?-------------------------------------------------------------------------------- ?Vitals Details ?Patient Name: Date of Service: ?[name], [name] 12/11/2021 1:00 PM ?Medical Record Number: [id number] ?Patient Account Number: [id number] ?Date of Birth/Sex: Treating RN: ?10/22/1940 (81 y.o. [name], [name] ?Primary Care Alzina Golda: [name] ?Other Clinician: [name] ?Referring Orris Perin: ?Treating Aranda Bihm/[name]: [name] ?[name] ?Weeks in Treatment:  25 ?Vital Signs ?Time Taken: 12:44 ?Temperature (??F): 97.5 ?Height (in): 64 ?Pulse (bpm): 80 ?Weight (lbs): 135 ?Respiratory Rate (breaths/min): 16 ?Body Mass Index (BMI): 23.2 ?Blood Pressure (mmHg): 170/62 ?Capillary Blood Glucose (mg/dl): 176 ?Reference Range: 80 - 120 mg / dl ?Airway ?Pulse Oximetry (%): 100 ?Electronic Signature(s) ?Signed: 12/12/2021 4:12:57 PM By: [name] CHT EMT BS ?, , ?Entered By: [name] on 12/11/2021 13:38:01 ?

## 2021-12-13 ENCOUNTER — Other Ambulatory Visit: Payer: Self-pay

## 2021-12-13 ENCOUNTER — Encounter (HOSPITAL_BASED_OUTPATIENT_CLINIC_OR_DEPARTMENT_OTHER): Payer: Medicare Other | Admitting: General Surgery

## 2021-12-13 DIAGNOSIS — T8131XD Disruption of external operation (surgical) wound, not elsewhere classified, subsequent encounter: Secondary | ICD-10-CM | POA: Diagnosis not present

## 2021-12-13 DIAGNOSIS — E11621 Type 2 diabetes mellitus with foot ulcer: Secondary | ICD-10-CM | POA: Diagnosis not present

## 2021-12-13 DIAGNOSIS — I70202 Unspecified atherosclerosis of native arteries of extremities, left leg: Secondary | ICD-10-CM | POA: Diagnosis not present

## 2021-12-13 DIAGNOSIS — Z89412 Acquired absence of left great toe: Secondary | ICD-10-CM | POA: Diagnosis not present

## 2021-12-13 DIAGNOSIS — E11628 Type 2 diabetes mellitus with other skin complications: Secondary | ICD-10-CM | POA: Diagnosis not present

## 2021-12-13 DIAGNOSIS — I10 Essential (primary) hypertension: Secondary | ICD-10-CM | POA: Diagnosis not present

## 2021-12-13 DIAGNOSIS — M86671 Other chronic osteomyelitis, right ankle and foot: Secondary | ICD-10-CM | POA: Diagnosis not present

## 2021-12-13 DIAGNOSIS — E11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: Secondary | ICD-10-CM | POA: Diagnosis not present

## 2021-12-13 DIAGNOSIS — Z4781 Encounter for orthopedic aftercare following surgical amputation: Secondary | ICD-10-CM | POA: Diagnosis not present

## 2021-12-13 DIAGNOSIS — L97514 Non-pressure chronic ulcer of other part of right foot with necrosis of bone: Secondary | ICD-10-CM | POA: Diagnosis not present

## 2021-12-13 DIAGNOSIS — D638 Anemia in other chronic diseases classified elsewhere: Secondary | ICD-10-CM | POA: Diagnosis not present

## 2021-12-13 DIAGNOSIS — L97512 Non-pressure chronic ulcer of other part of right foot with fat layer exposed: Secondary | ICD-10-CM | POA: Diagnosis not present

## 2021-12-13 LAB — GLUCOSE, CAPILLARY: Glucose-Capillary: 167 mg/dL — ABNORMAL HIGH (ref 70–99)

## 2021-12-13 NOTE — Progress Notes (Addendum)
[name], MULVANEY ([id number]) ?Visit Report for 12/13/2021 ?Arrival Information Details ?Patient Name: Date of Service: ?[name], [name] 12/13/2021 12:30 PM ?Medical Record Number: [id number] ?Patient Account Number: [id number] ?Date of Birth/Sex: Treating RN: ?20-Apr-1941 (81 y.o. [name], [name] ?Primary Care Calista Crain: [name] ?Other Clinician: [name] ?Referring Silvana Paone: ?Treating Othon Guardia/[name]: [name] ?[name] ?Weeks in Treatment: 25 ?Visit Information History Since Last Visit ?All ordered tests and consults were completed: Yes ?Patient Arrived: Ambulatory ?Added or deleted any medications: No ?Arrival Time: 12:44 ?Any new allergies or adverse reactions: No ?Accompanied By: None ?Had a fall or experienced change in No ?Transfer Assistance: None ?activities of daily living that may affect ?Patient Identification Verified: Yes ?risk of falls: ?Secondary Verification Process Completed: Yes ?Signs or symptoms of abuse/neglect since last visito No ?Patient Requires Transmission-Based Precautions: No ?Hospitalized since last visit: No ?Patient Has Alerts: Yes ?Implantable device outside of the clinic excluding No ?Patient Alerts: Patient on Blood Thinner cellular tissue based products placed in the center ?since last visit: ?Pain Present Now: Unable to Respond ?Notes ?Paper version was used prior to the treatment. ?Electronic Signature(s) ?Signed: 12/13/2021 3:36:45 PM By: [name] EMT ?Entered By: [name] on 12/13/2021 15:36:45 ?-------------------------------------------------------------------------------- ?Encounter Discharge Information Details ?Patient Name: Date of Service: ?[name], [name] 12/13/2021 12:30 PM ?Medical Record Number: [id number] ?Patient Account Number: [id number] ?Date of Birth/Sex: Treating RN: ?08-28-1941 (81 y.o. [name], [name] ?Primary Care Amiyrah Lamere: [name] ?Other Clinician: [name] ?Referring Atiana Levier: ?Treating  Halea Lieb/[name]: [name] ?[name] ?Weeks in Treatment: 25 ?Encounter Discharge Information Items ?Discharge Condition: Stable ?Ambulatory Status: [name] ?Discharge Destination: Home ?Transportation: Private Auto ?Accompanied By: None ?Schedule Follow-up Appointment: Yes ?Clinical Summary of Care: ?Notes ?The patient waited for 10mn after drinking Glucerna. She stated that she felt fine. ?Electronic Signature(s) ?Signed: 12/13/2021 3:50:24 PM By: GValeria BatmanEMT ?Entered By: GValeria Batmanon 12/13/2021 15:50:24 ?-------------------------------------------------------------------------------- ?Vitals Details ?Patient Name: ?Date of Service: ?[name], [name] 12/13/2021 12:30 PM ?Medical Record Number: [id number]?Patient Account Number: [id number]?Date of Birth/Sex: ?Treating RN: ?6July 10, 1942((81y.o. FBenjamine [name] [name] ?Primary Care Rhyatt Muska: TSela [name]?Other Clinician: GValeria Batman?Referring Sheila Gervasi: ?Treating Deverick Pruss/[name]: CFredirick Maudlin?TSela [name]?Weeks in Treatment: 25 ?Vital Signs ?Time Taken: 12:57 ?Temperature (??F): 97.6 ?Height (in): 64 ?Pulse (bpm): 81 ?Weight (lbs): 135 ?Respiratory Rate (breaths/min): 16 ?Body Mass Index (BMI): 23.2 ?Blood Pressure (mmHg): 178/54 ?Capillary Blood Glucose (mg/dl): 167 ?Reference Range: 80 - 120 mg / dl ?Airway ?Pulse Oximetry (%): 100 ?Notes ?Paper version was used prior to the treatment. ?Electronic Signature(s) ?Signed: 12/13/2021 3:37:47 PM By: GValeria BatmanEMT ?Entered By: GValeria Batmanon 12/13/2021 15:37:47 ?

## 2021-12-13 NOTE — Progress Notes (Signed)
[name], [name] ([id number]) ?Visit Report for 12/13/2021 ?Problem List Details ?Patient Name: Date of Service: ?[name], [name] 12/13/2021 12:30 PM ?Medical Record Number: [id number] ?Patient Account Number: [id number] ?Date of Birth/Sex: Treating RN: ?[birth year] (81 y.o. 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 06/15/2021 No Yes ?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, right ankle and foot 10/25/2021 No Yes ?Inactive Problems ?ICD-10 ?Code Description Active Date Inactive Date ?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 12/13/2021 ?Medical Record Number: [id number] ?Patient Account Number: [id number] ?Date of Birth/Sex: ?Treating RN: ?04/09/1941 (81 y.o. 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 ?A83.419 Other chronic osteomyelitis, right ankle and foot ?Facility Procedures ?CPT4 Code: 62229798 ?Description: G0277-(Facility Use Only) HBOT full body chamber, 11mn , ICD-10 Diagnosis Description 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 M86.671 Other  ?chronic osteomyelitis, right ankle and foot T81.31XD Disruption of external operation (surgical) wound, not elsewhere classified, subse ?Modifier: quent encounter ?Quantity: 4 ?Physician Procedures ?: CPT4 Code Description Modifier [id number]- WC PHYS HYPERBARIC OXYGEN THERAPY ICD-10 Diagnosis Description 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

## 2021-12-13 NOTE — Progress Notes (Addendum)
[name], [name] ([id number]) ?Visit Report for 12/13/2021 ?HBO Details ?Patient Name: Date of Service: ?[name], [name] 12/13/2021 12:30 PM ?Medical Record Number: [id number] ?Patient Account Number: [id number] ?Date of Birth/Sex: Treating RN: ?01/21/1941 (81 y.o. 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 ?HBO Treatment Details ?Treatment Number: 25 ?Patient Type: Outpatient ?Chamber Type: Monoplace ?Chamber Serial #: [id number] ?Treatment Protocol: 2.0 ATA with 90 minutes oxygen, and no air breaks ?Treatment Details ?Compression Rate Down: 2.0 psi / minute ?De-Compression Rate Up: 2.0 psi / minute ?Air ?breaks ?and ?breathing Decompress Decompress ?Compress Tx Pressure ?Begins Reached periods Begins Ends ?(leave ?unused ?spaces ?blank) ?Chamber Pressure (ATA 1 2 ------2 1 ?) ?Clock Time (24 hr) 13:09 13:20 - - - - - - 14:50 14:57 ?Treatment Length: 108 (minutes) ?Treatment Segments: 4 ?Vital Signs ?Capillary Blood Glucose Reference Range: 80 - 120 mg / dl ?HBO Diabetic Blood Glucose Intervention Range: <131 mg/dl or >249 mg/dl ?Type: Time Vitals Blood Pulse: Respiratory Temperature: Capillary Blood Glucose Pulse Action ?Taken: ?Pressure: ?Rate: ?Glucose (mg/dl): Meter #: Oximetry (%) Taken: ?Pre 12:57 178/54 81 16 97.6 167 100 ?Post 15:08 170/78 72 18 97.4 95 Patient given 1 Glucerna to drink. ?Treatment Response ?Treatment Toleration: Well ?Treatment Completion Status: Treatment Completed without Adverse  Event ?Treatment Notes ?Compression rate of 1.0 PSI/min to 4 PSI, the 2.0 PSI/min to 2 ATA. The patient did not have any problems clearing her ears. ?Additional Procedure Documentation ?Tissue Sevierity: Fat layer exposed ?Physician HBO Attestation: ?I certify that I supervised this HBO treatment in accordance with Medicare ?guidelines. A trained emergency response team is readily available per Yes ?hospital policies and procedures. ?Continue HBOT as ordered. Yes ?Electronic Signature(s) ?Signed: 12/13/2021 4:10:43 PM By: [name] MD FACS ?Previous Signature: 12/13/2021 3:44:38 PM Version By: [name] EMT ?Entered By: [name] on 12/13/2021 16:10:43 ?-------------------------------------------------------------------------------- ?HBO Safety Checklist Details ?Patient Name: ?Date of Service: ?[name], [name] 12/13/2021 12:30 PM ?Medical Record Number: [id number] ?Patient Account Number: [id number] ?Date of Birth/Sex: ?Treating RN: ?September 02, 1941 (81 y.o. [name], [name] ?Primary Care [name]: [name] ?Other Clinician: [name] ?Referring Itati Brocksmith: ?Treating Abdulahi Schor/[name]: [name] ?[name] ?Weeks in Treatment: 25 ?HBO Safety Checklist Items ?Safety Checklist ?Consent Form Signed ?Patient voided / foley secured and emptied ?When did you last eato 0830 ?Last dose of injectable or oral agent 0830 ?Ostomy pouch emptied and vented if applicable ?NA ?All implantable devices assessed, documented and approved ?NA ?Intravenous access site secured and place ?NA ?Valuables secured ?Linens and cotton and cotton/polyester blend (less than 51% polyester) ?Personal oil-based products / skin lotions / body lotions removed ?Wigs or hairpieces removed ?Smoking or tobacco materials removed ?Books / newspapers / magazines / loose paper removed ?Cologne, aftershave, perfume and deodorant removed ?Jewelry removed (may wrap wedding band) ?NA ?Make-up removed ?Hair care products  removed ?Battery operated devices (external) removed ?Heating patches and chemical warmers removed ?Titanium eyewear removed ?NA ?Nail polish cured greater than 10 hours ?NA ?Casting material cured greater than 10 hours ?NA ?Hearing aids removed ?NA ?Loose dentures or partials removed ?NA ?Prosthetics have been removed ?NA ?Patient  demonstrates correct use of air break device (if applicable) ?Patient concerns have been addressed ?Patient grounding bracelet on and cord attached to chamber ?Specifics for Inpatients (complete in addition to above) ?Medication sheet sent with patient ?NA ?Intravenous medications needed or due during therapy sent with patient ?NA ?Drainage tubes (e.g. nasogastric tube or chest tube secured and vented) ?NA ?Endotracheal or Tracheotomy tube secured ?NA ?Cuff deflated of air and inflated with saline ?NA ?Airway suctioned ?NA ?Notes ?Paper version was used prior to the treatment. I asked the patient if she had any problems today with her ears or sinus. She stated no. ?Electronic Signature(s) ?Signed: 12/13/2021 3:41:12 PM By: [name] EMT ?Entered By: [name] on 12/13/2021 15:41:11 ?

## 2021-12-14 ENCOUNTER — Encounter (HOSPITAL_BASED_OUTPATIENT_CLINIC_OR_DEPARTMENT_OTHER): Payer: Medicare Other | Admitting: General Surgery

## 2021-12-14 DIAGNOSIS — L97514 Non-pressure chronic ulcer of other part of right foot with necrosis of bone: Secondary | ICD-10-CM | POA: Diagnosis not present

## 2021-12-14 DIAGNOSIS — M86671 Other chronic osteomyelitis, right ankle and foot: Secondary | ICD-10-CM | POA: Diagnosis not present

## 2021-12-14 DIAGNOSIS — E11621 Type 2 diabetes mellitus with foot ulcer: Secondary | ICD-10-CM | POA: Diagnosis not present

## 2021-12-14 DIAGNOSIS — L97512 Non-pressure chronic ulcer of other part of right foot with fat layer exposed: Secondary | ICD-10-CM | POA: Diagnosis not present

## 2021-12-14 DIAGNOSIS — E11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: Secondary | ICD-10-CM | POA: Diagnosis not present

## 2021-12-14 DIAGNOSIS — Z20822 Contact with and (suspected) exposure to covid-19: Secondary | ICD-10-CM | POA: Diagnosis not present

## 2021-12-14 DIAGNOSIS — T8131XD Disruption of external operation (surgical) wound, not elsewhere classified, subsequent encounter: Secondary | ICD-10-CM | POA: Diagnosis not present

## 2021-12-14 LAB — GLUCOSE, CAPILLARY
Glucose-Capillary: 95 mg/dL (ref 70–99)
Glucose-Capillary: 154 mg/dL — ABNORMAL HIGH (ref 70–99)
Glucose-Capillary: 77 mg/dL (ref 70–99)

## 2021-12-14 NOTE — Progress Notes (Signed)
TOPEKA, GIAMMONA ([id number]) ?Visit Report for 12/14/2021 ?Problem List Details ?Patient Name: Date of Service: ?[name], [name] 12/14/2021 12:30 PM ?Medical Record Number: [id number] ?Patient Account Number: [id number] ?Date of Birth/Sex: Treating RN: ?1940/11/18 (81 y.o. 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 06/15/2021 No Yes ?Y19.509 Other chronic osteomyelitis, right ankle and foot 10/25/2021 No Yes ?Inactive Problems ?ICD-10 ?Code Description Active Date Inactive Date ?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 12/14/2021 ?Medical Record Number: [id number] ?Patient Account Number: [id number] ?Date of Birth/Sex: ?Treating RN: ?November 21, 1940 (81 y.o. 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 ?K99.833 Other chronic osteomyelitis, right ankle and foot ?Facility Procedures ?CPT4 Code: 82505397 ?Description: G0277-(Facility Use Only) HBOT full body chamber, 68mn , ICD-10 Diagnosis Description 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 M86.671 Other  ?chronic osteomyelitis, right ankle and foot T81.31XD Disruption of external operation (surgical) wound, not elsewhere classified, subse ?Modifier: quent encounter ?Quantity: 4 ?Physician Procedures ?: CPT4 Code Description Modifier [id number] 79024- WC PHYS HYPERBARIC OXYGEN THERAPY ICD-10 Diagnosis Description 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

## 2021-12-14 NOTE — Progress Notes (Signed)
[name], [name] ([id number]) ?Visit Report for 12/14/2021 ?HBO Details ?Patient Name: Date of Service: ?[name], [name] 12/14/2021 12:30 PM ?Medical Record Number: [id number] ?Patient Account Number: [id number] ?Date of Birth/Sex: Treating RN: ?[birth year] (81 y.o. 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 ?HBO Treatment Details ?Treatment Number: 26 ?Patient Type: Outpatient ?Chamber Type: Monoplace ?Chamber Serial #: [id number] ?Treatment Protocol: 2.0 ATA with 90 minutes oxygen, and no air breaks ?Treatment Details ?Compression Rate Down: 2.0 psi / minute ?De-Compression Rate Up: 2.0 psi / minute ?Air ?breaks ?and ?breathing Decompress Decompress ?Compress Tx Pressure ?Begins Reached periods Begins Ends ?(leave ?unused ?spaces ?blank) ?Chamber Pressure (ATA 1 2 ------2 1 ?) ?Clock Time (24 hr) 12:54 13:04 - - - - - - 14:35 14:43 ?Treatment Length: 109 (minutes) ?Treatment Segments: 4 ?Vital Signs ?Capillary Blood Glucose Reference Range: 80 - 120 mg / dl ?HBO Diabetic Blood Glucose Intervention Range: <131 mg/dl or >249 mg/dl ?Type: Time Vitals Blood Pulse: Respiratory Temperature: Capillary Blood Glucose Pulse Action ?Taken: ?Pressure: ?Rate: ?Glucose (mg/dl): Meter #: Oximetry (%) Taken: ?Pre 12:29 146/64 74 14 98.4 154 100 ?Post 14:49 142/64 74 16 98 77 Patient given 1 Glucerna to drink. ?Treatment Response ?Treatment Toleration: Well ?Treatment Completion Status: Treatment Completed without Adverse  Event ?Treatment Notes ?The patient did well with her treatment today. After treatment CBG was 77. The patient was given 1 8oz Glucerna to drink and waited 35mn, before discharge. ?Additional Procedure Documentation ?Tissue Sevierity: Fat layer exposed ?Physician HBO Attestation: ?I certify that I supervised this HBO treatment in accordance with Medicare ?guidelines. A trained emergency response team is readily available per Yes ?hospital policies and procedures. ?Continue HBOT as ordered. Yes ?Electronic Signature(s) ?Signed: 12/14/2021 3:54:48 PM By: CFredirick MaudlinMD FACS ?Previous Signature: 12/14/2021 3:49:55 PM Version By: GValeria BatmanEMT ?Previous Signature: 12/14/2021 1:57:00 PM Version By: SDonavan BurnetCHT EMT BS ?, , ?Entered By: CFredirick Maudlinon 12/14/2021 15:54:47 ?-------------------------------------------------------------------------------- ?HBO Safety Checklist Details ?Patient Name: ?Date of Service: ?[name], [name] 12/14/2021 12:30 PM ?Medical Record Number: [id number]?Patient Account Number: [id number]?Date of Birth/Sex: ?Treating RN: ?612-Dec-1942((81y.o. FBenjamine [name] [name] ?Primary Care [name]: TSela [name]?Other Clinician: SDonavan Burnet?Referring Limuel Nieblas: ?Treating [name]/[name]: CFredirick Maudlin?TSela [name]?Weeks in Treatment: 26 ?HBO Safety Checklist Items ?Safety Checklist ?Consent Form Signed ?Patient voided / foley secured and emptied ?When did you last eato 0830 ?Last dose of injectable or oral agent 0830 ?Ostomy pouch emptied and vented if applicable ?NA ?All implantable devices assessed, documented and approved ?NA ?Intravenous access site secured and place ?NA ?Valuables secured ?Linens and cotton and cotton/polyester blend (less than 51% polyester) ?Personal oil-based products / skin lotions / body lotions removed ?Wigs or hairpieces removed patient removed item ?Smoking or tobacco materials removed ?NA ?Books / newspapers / magazines / loose  paper removed ?Cologne, aftershave, perfume and deodorant removed ?Jewelry removed (may wrap wedding band) ?Make-up removed ?Hair care products removed ?Battery operated devices (external) removed ?Heating patches and chemical warmers removed ?Titanium eyewear removed ?Nail polish cured greater than 10 hours ?Casting material cured greater than 10  hours ?NA ?Hearing aids removed ?NA ?Loose dentures or partials removed ?NA ?Prosthetics have been removed ?NA ?Patient demonstrates correct use of air break device (if applicable) ?Patient concerns have been addressed ?Patient grounding bracelet on and cord attached to chamber ?Specifics for Inpatients (complete in addition to above) ?Medication sheet sent with patient ?NA ?Intravenous medications needed or due during therapy sent with patient ?NA ?Drainage tubes (e.g. nasogastric tube or chest tube secured and vented) ?NA ?Endotracheal or Tracheotomy tube secured ?NA ?Cuff deflated of air and inflated with saline ?NA ?Airway suctioned ?NA ?Notes ?Paper version used prior to treatment. ?Electronic Signature(s) ?Signed: 12/14/2021 4:35:51 PM By: [name] CHT EMT BS ?, , ?Entered By: [name] on 12/14/2021 13:24:51 ?

## 2021-12-14 NOTE — Progress Notes (Signed)
JANIT, CUTTER ([id number]) ?Visit Report for 12/14/2021 ?Arrival Information Details ?Patient Name: Date of Service: ?[name], [name] 12/14/2021 12:30 PM ?Medical Record Number: [id number] ?Patient Account Number: [id number] ?Date of Birth/Sex: Treating RN: ?1941/04/13 (81 y.o. [name], [name] ?Primary Care Yoceline Bazar: [name] ?Other Clinician: [name] ?Referring Natausha Jungwirth: ?Treating Adalaide Jaskolski/[name]: [name] ?[name] ?Weeks in Treatment: 26 ?Visit Information History Since Last Visit ?All ordered tests and consults were completed: Yes ?Patient Arrived: Ambulatory ?Added or deleted any medications: No ?Arrival Time: 12:52 ?Any new allergies or adverse reactions: No ?Accompanied By: self ?Had a fall or experienced change in No ?Transfer Assistance: None ?activities of daily living that may affect ?Patient Identification Verified: Yes ?risk of falls: ?Secondary Verification Process Completed: Yes ?Signs or symptoms of abuse/neglect since last visito No ?Patient Requires Transmission-Based Precautions: No ?Hospitalized since last visit: No ?Patient Has Alerts: Yes ?Implantable device outside of the clinic excluding No ?Patient Alerts: Patient on Blood Thinner cellular tissue based products placed in the center ?since last visit: ?Pain Present Now: No ?Electronic Signature(s) ?Signed: 12/14/2021 4:35:51 PM By: [name] CHT EMT BS ?, , ?Entered By: [name] on 12/14/2021 13:21:57 ?-------------------------------------------------------------------------------- ?Encounter Discharge Information Details ?Patient Name: Date of Service: ?[name], [name] 12/14/2021 12:30 PM ?Medical Record Number: [id number] ?Patient Account Number: [id number] ?Date of Birth/Sex: Treating RN: ?1940-10-29 (81 y.o. [name], [name] ?Primary Care Aayan Haskew: [name] ?Other Clinician: [name] ?Referring Renesmee Raine: ?Treating Neven Fina/[name]: [name] ?[name] ?Weeks in Treatment: 26 ?Encounter Discharge Information Items ?Discharge Condition: Stable ?Ambulatory Status: [name] ?Discharge Destination: Home ?Transportation: Private Auto ?Accompanied By: None ?Schedule Follow-up Appointment: Yes ?Clinical Summary of Care: ?Electronic Signature(s) ?Signed: 12/14/2021 3:52:07 PM By: [name] EMT ?Entered By: [name] on 12/14/2021 15:52:07 ?-------------------------------------------------------------------------------- ?Vitals Details ?Patient Name: ?Date of Service: ?[name], [name] 12/14/2021 12:30 PM ?Medical Record Number: [id number] ?Patient Account Number: [id number] ?Date of Birth/Sex: ?Treating RN: ?June 12, 1941 (81 y.o. [name], [name] ?Primary Care Marine Lezotte: [name] ?Other Clinician: [name] ?Referring Berl Bonfanti: ?Treating Deanthony Maull/[name]: [name] ?[name] ?Weeks in Treatment: 26 ?Vital Signs ?Time Taken: 12:29 ?Temperature (??F): 98.4 ?Height (in): 64 ?Pulse (bpm): 74 ?Weight (lbs): 135 ?Respiratory Rate (breaths/min): 14 ?Body Mass Index (BMI): 23.2 ?Blood Pressure (mmHg): 146/64 ?Capillary Blood Glucose (mg/dl): 154 ?Reference Range: 80 - 120 mg / dl ?Airway ?Pulse Oximetry (%): 100 ?Electronic Signature(s) ?Signed: 12/14/2021 4:35:51 PM By: [name] CHT EMT BS ?, , ?Entered By: [name] on 12/14/2021 13:22:32 ?

## 2021-12-15 ENCOUNTER — Other Ambulatory Visit: Payer: Self-pay

## 2021-12-15 ENCOUNTER — Encounter (HOSPITAL_BASED_OUTPATIENT_CLINIC_OR_DEPARTMENT_OTHER): Payer: Medicare Other | Admitting: General Surgery

## 2021-12-15 DIAGNOSIS — D638 Anemia in other chronic diseases classified elsewhere: Secondary | ICD-10-CM | POA: Diagnosis not present

## 2021-12-15 DIAGNOSIS — Z89412 Acquired absence of left great toe: Secondary | ICD-10-CM | POA: Diagnosis not present

## 2021-12-15 DIAGNOSIS — T8131XD Disruption of external operation (surgical) wound, not elsewhere classified, subsequent encounter: Secondary | ICD-10-CM | POA: Diagnosis not present

## 2021-12-15 DIAGNOSIS — Z4781 Encounter for orthopedic aftercare following surgical amputation: Secondary | ICD-10-CM | POA: Diagnosis not present

## 2021-12-15 DIAGNOSIS — I10 Essential (primary) hypertension: Secondary | ICD-10-CM | POA: Diagnosis not present

## 2021-12-15 DIAGNOSIS — L97512 Non-pressure chronic ulcer of other part of right foot with fat layer exposed: Secondary | ICD-10-CM | POA: Diagnosis not present

## 2021-12-15 DIAGNOSIS — E11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: Secondary | ICD-10-CM | POA: Diagnosis not present

## 2021-12-15 DIAGNOSIS — E11628 Type 2 diabetes mellitus with other skin complications: Secondary | ICD-10-CM | POA: Diagnosis not present

## 2021-12-15 DIAGNOSIS — E11621 Type 2 diabetes mellitus with foot ulcer: Secondary | ICD-10-CM | POA: Diagnosis not present

## 2021-12-15 DIAGNOSIS — L97514 Non-pressure chronic ulcer of other part of right foot with necrosis of bone: Secondary | ICD-10-CM | POA: Diagnosis not present

## 2021-12-15 DIAGNOSIS — I70202 Unspecified atherosclerosis of native arteries of extremities, left leg: Secondary | ICD-10-CM | POA: Diagnosis not present

## 2021-12-15 DIAGNOSIS — M86671 Other chronic osteomyelitis, right ankle and foot: Secondary | ICD-10-CM | POA: Diagnosis not present

## 2021-12-15 LAB — GLUCOSE, CAPILLARY
Glucose-Capillary: 115 mg/dL — ABNORMAL HIGH (ref 70–99)
Glucose-Capillary: 117 mg/dL — ABNORMAL HIGH (ref 70–99)
Glucose-Capillary: 101 mg/dL — ABNORMAL HIGH (ref 70–99)

## 2021-12-15 NOTE — Progress Notes (Signed)
[name], [name] ([id number]) ?Visit Report for 12/15/2021 ?Problem List Details ?Patient Name: Date of Service: ?[name], [name] 12/15/2021 10:30 A M ?Medical Record Number: [id number] ?Patient Account Number: [id number] ?Date of Birth/Sex: Treating RN: ?Oct 02, 1940 (81 y.o. 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 06/15/2021 No Yes ?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, right ankle and foot 10/25/2021 No Yes ?Inactive Problems ?ICD-10 ?Code Description Active Date Inactive Date ?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 12/15/2021 ?Medical Record Number: [id number] ?Patient Account Number: [id number] ?Date of Birth/Sex: ?Treating RN: ?10-18-1940 (81 y.o. 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 ?A57.903 Other chronic osteomyelitis, right ankle and foot ?Facility Procedures ?CPT4 Code: 83338329 ?Description: G0277-(Facility Use Only) HBOT full body chamber, 25mn , ICD-10 Diagnosis Description 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 M86.671 Other  ?chronic osteomyelitis, right ankle and foot T81.31XD Disruption of external operation (surgical) wound, not elsewhere classified, subse ?Modifier: quent encounter ?Quantity: 4 ?Physician Procedures ?: CPT4 Code Description Modifier [id number]- WC PHYS HYPERBARIC OXYGEN THERAPY ICD-10 Diagnosis Description 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

## 2021-12-15 NOTE — Progress Notes (Addendum)
ASJIA, BERRIOS ([id number]) ?Visit Report for 12/15/2021 ?Arrival Information Details ?Patient Name: Date of Service: ?[name], [name] 12/15/2021 10:30 A M ?Medical Record Number: [id number] ?Patient Account Number: [id number] ?Date of Birth/Sex: Treating RN: ?January 16, 1941 (81 y.o. [name], [name] ?Primary Care Deakon Frix: [name] ?Other Clinician: [name] ?Referring Christiaan Strebeck: ?Treating Takaya Hyslop/[name]: [name] ?[name] ?Weeks in Treatment: 26 ?Visit Information History Since Last Visit ?All ordered tests and consults were completed: Yes ?Patient Arrived: Ambulatory ?Added or deleted any medications: No ?Arrival Time: 10:34 ?Any new allergies or adverse reactions: No ?Accompanied By: None ?Had a fall or experienced change in No ?Transfer Assistance: None ?activities of daily living that may affect ?Patient Identification Verified: Yes ?risk of falls: ?Secondary Verification Process Completed: Yes ?Signs or symptoms of abuse/neglect since last visito No ?Patient Requires Transmission-Based Precautions: No ?Hospitalized since last visit: No ?Patient Has Alerts: Yes ?Implantable device outside of the clinic excluding No ?Patient Alerts: Patient on Blood Thinner cellular tissue based products placed in the center ?since last visit: ?Pain Present Now: No ?Notes ?Paper version was used prior to the treatment. ?Electronic Signature(s) ?Signed: 12/15/2021 2:25:03 PM By: [name] EMT ?Entered By: [name] on 12/15/2021 14:25:02 ?-------------------------------------------------------------------------------- ?Encounter Discharge Information Details ?Patient Name: Date of Service: ?[name], [name] 12/15/2021 10:30 A M ?Medical Record Number: [id number] ?Patient Account Number: [id number] ?Date of Birth/Sex: Treating RN: ?1940/10/11 (81 y.o. [name], [name] ?Primary Care Deshanta Lady: [name] ?Other Clinician: [name] ?Referring Suri Tafolla: ?Treating  Sheriden Archibeque/[name]: [name] ?[name] ?Weeks in Treatment: 26 ?Encounter Discharge Information Items ?Discharge Condition: Stable ?Ambulatory Status: [name] ?Discharge Destination: Home ?Transportation: Private Auto ?Accompanied By: None ?Schedule Follow-up Appointment: Yes ?Clinical Summary of Care: ?Electronic Signature(s) ?Signed: 12/15/2021 2:45:55 PM By: [name] EMT ?Entered By: [name] on 12/15/2021 14:45:55 ?-------------------------------------------------------------------------------- ?Vitals Details ?Patient Name: ?Date of Service: ?[name], [name] 12/15/2021 10:30 A M ?Medical Record Number: [id number] ?Patient Account Number: [id number] ?Date of Birth/Sex: ?Treating RN: ?07-19-1941 (81 y.o. [name], [name] ?Primary Care Trevontae Lindahl: [name] ?Other Clinician: [name] ?Referring Endre Coutts: ?Treating Ozell Ferrera/[name]: [name] ?[name] ?Weeks in Treatment: 26 ?Vital Signs ?Time Taken: 11:18 ?Capillary Blood Glucose (mg/dl): 115 ?Height (in): 64 ?Reference Range: 80 - 120 mg / dl ?Weight (lbs): 135 ?Body Mass Index (BMI): 23.2 ?Electronic Signature(s) ?Signed: 12/15/2021 2:30:26 PM By: [name] EMT ?Entered By: [name] on 12/15/2021 14:30:26 ?

## 2021-12-15 NOTE — Progress Notes (Addendum)
[name], [name] ([id number]) ?Visit Report for 12/15/2021 ?HBO Details ?Patient Name: Date of Service: ?[name], [name] 12/15/2021 10:30 A M ?Medical Record Number: [id number] ?Patient Account Number: [id number] ?Date of Birth/Sex: Treating RN: ?1940-12-01 (81 y.o. 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 ?HBO Treatment Details ?Treatment Number: 27 ?Patient Type: Outpatient ?Chamber Type: Monoplace ?Chamber Serial #: [id number] ?Treatment Protocol: 2.0 ATA with 90 minutes oxygen, and no air breaks ?Treatment Details ?Compression Rate Down: 2.0 psi / minute ?De-Compression Rate Up: 2.0 psi / minute ?Air ?breaks ?and ?breathing Decompress Decompress ?Compress Tx Pressure ?Begins Reached periods Begins Ends ?(leave ?unused ?spaces ?blank) ?Chamber Pressure (ATA 1 2 ------2 1 ?) ?Clock Time (24 hr) 11:32 11:41 - - - - - - 13:11 13:20 ?Treatment Length: 108 (minutes) ?Treatment Segments: 4 ?Vital Signs ?Capillary Blood Glucose Reference Range: 80 - 120 mg / dl ?HBO Diabetic Blood Glucose Intervention Range: <131 mg/dl or >249 mg/dl ?Type: Time Vitals Blood Pulse: Respiratory Temperature: Capillary Blood Glucose Pulse Action ?Taken: Pressure: Rate: Glucose (mg/dl): Meter #: Oximetry (%) Taken: ?Pre 11:18 115 Patient giver 8oz of Glucerna to drink ?Post 13:27 148/67 72 16 98 101 ?Pre 11:25 130/54 66 16 97.9 117 Spoke with Dr. Chester Holstein CGB of 117 ?Treatment Response ?Treatment Toleration: Well ?Treatment Completion  Status: Treatment Completed without Adverse Event ?Treatment Notes ?The Patient CBG at 1118 was 115, Patient giver 8oz of Glucerna to drink, waited 30 min, and rechecked CBG. CBG at 1125 was 117, Spoke with Dr. Celine Ahr. ?Dr. Celine Ahr was informed of CBG, and that the patient was given Glucerma. Dr. Celine Ahr ok with starting treatment. Post treatment CBG was 101. Treatment ?started traveling at 1.0 PSI/min to 4 PSI. She was able to clear her ears. Treatment travel increased to 2.0 PSI/min to 2 ATA. She had no problems clearing ?today. The patient stated that she was going to lunch after her treatment today. No problem with her treatment today. ?Additional Procedure Documentation ?Tissue Sevierity: Fat layer exposed ?Physician HBO Attestation: ?I certify that I supervised this HBO treatment in accordance with Medicare ?guidelines. A trained emergency response team is readily available per Yes ?hospital policies and procedures. ?Continue HBOT as ordered. Yes ?Electronic Signature(s) ?Signed: 12/15/2021 3:52:44 PM By: [name] MD FACS ?Previous Signature: 12/15/2021 2:43:55 PM Version By: [name] EMT ?Entered By: [name] on 12/15/2021 15:52:44 ?-------------------------------------------------------------------------------- ?HBO Safety Checklist Details ?Patient Name: ?Date of Service: ?[name], [name] 12/15/2021 10:30 A M ?Medical Record Number: [id number] ?Patient Account Number: [id number] ?Date of Birth/Sex: ?Treating RN: ?02/11/1941 (81 y.o. [name], [name] ?Primary Care Ahijah Devery: [name] ?Other Clinician: [name] ?Referring Kissa Campoy: ?Treating Kiam Bransfield/[name]: [name] ?[name] ?Weeks in Treatment: 26 ?HBO Safety Checklist Items ?Safety Checklist ?Consent Form Signed ?Patient voided / foley secured and emptied ?When did you last eato 0830 ?Last dose of injectable or oral agent 0830 ?Ostomy pouch emptied and vented if applicable ?NA ?All implantable devices  assessed, documented and approved ?NA ?Intravenous access site secured and place ?NA ?Valuables secured ?Linens and cotton and cotton/polyester blend (less than 51% polyester) ?Personal oil-based  products / skin lotions / body lotions removed ?Wigs or hairpieces removed ?Smoking or tobacco materials removed ?Books / newspapers / magazines / loose paper removed ?Cologne, aftershave, perfume and deodorant removed ?Jewelry removed (may wrap wedding band) ?NA ?Make-up removed ?Hair care products removed ?Battery operated devices (external) removed ?Heating patches and chemical warmers removed ?Titanium eyewear removed ?NA ?Nail polish cured greater than 10 hours ?NA ?Casting material cured greater than 10 hours ?NA ?Hearing aids removed ?NA ?Loose dentures or partials removed ?NA ?Prosthetics have been removed ?NA ?Patient demonstrates correct use of air break device (if applicable) ?Patient concerns have been addressed ?Patient grounding bracelet on and cord attached to chamber ?Specifics for Inpatients (complete in addition to above) ?Medication sheet sent with patient ?NA ?Intravenous medications needed or due during therapy sent with patient ?NA ?Drainage tubes (e.g. nasogastric tube or chest tube secured and vented) ?NA ?Endotracheal or Tracheotomy tube secured ?NA ?Cuff deflated of air and inflated with saline ?NA ?Airway suctioned ?NA ?Notes ?Paper version was used prior to the treatment. I asked the patient if she had any problems today with her ears or sinus. She said no. ?Electronic Signature(s) ?Signed: 12/15/2021 2:33:12 PM By: [name] EMT ?Entered By: [name] on 12/15/2021 14:33:11 ?

## 2021-12-18 ENCOUNTER — Other Ambulatory Visit: Payer: Self-pay | Admitting: Vascular Surgery

## 2021-12-18 ENCOUNTER — Ambulatory Visit: Payer: Medicare Other | Admitting: Internal Medicine

## 2021-12-18 ENCOUNTER — Other Ambulatory Visit: Payer: Self-pay | Admitting: Endocrinology

## 2021-12-18 ENCOUNTER — Encounter (HOSPITAL_BASED_OUTPATIENT_CLINIC_OR_DEPARTMENT_OTHER): Payer: Medicare Other | Admitting: Internal Medicine

## 2021-12-18 ENCOUNTER — Other Ambulatory Visit: Payer: Self-pay

## 2021-12-18 DIAGNOSIS — I10 Essential (primary) hypertension: Secondary | ICD-10-CM | POA: Diagnosis not present

## 2021-12-18 DIAGNOSIS — Z4781 Encounter for orthopedic aftercare following surgical amputation: Secondary | ICD-10-CM | POA: Diagnosis not present

## 2021-12-18 DIAGNOSIS — D638 Anemia in other chronic diseases classified elsewhere: Secondary | ICD-10-CM | POA: Diagnosis not present

## 2021-12-18 DIAGNOSIS — M86671 Other chronic osteomyelitis, right ankle and foot: Secondary | ICD-10-CM | POA: Diagnosis not present

## 2021-12-18 DIAGNOSIS — L97514 Non-pressure chronic ulcer of other part of right foot with necrosis of bone: Secondary | ICD-10-CM | POA: Diagnosis not present

## 2021-12-18 DIAGNOSIS — E11621 Type 2 diabetes mellitus with foot ulcer: Secondary | ICD-10-CM

## 2021-12-18 DIAGNOSIS — E11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: Secondary | ICD-10-CM | POA: Diagnosis not present

## 2021-12-18 DIAGNOSIS — T8131XD Disruption of external operation (surgical) wound, not elsewhere classified, subsequent encounter: Secondary | ICD-10-CM | POA: Diagnosis not present

## 2021-12-18 DIAGNOSIS — E11628 Type 2 diabetes mellitus with other skin complications: Secondary | ICD-10-CM | POA: Diagnosis not present

## 2021-12-18 DIAGNOSIS — Z89412 Acquired absence of left great toe: Secondary | ICD-10-CM | POA: Diagnosis not present

## 2021-12-18 DIAGNOSIS — I70202 Unspecified atherosclerosis of native arteries of extremities, left leg: Secondary | ICD-10-CM | POA: Diagnosis not present

## 2021-12-18 LAB — GLUCOSE, CAPILLARY
Glucose-Capillary: 215 mg/dL — ABNORMAL HIGH (ref 70–99)
Glucose-Capillary: 149 mg/dL — ABNORMAL HIGH (ref 70–99)

## 2021-12-19 ENCOUNTER — Ambulatory Visit (INDEPENDENT_AMBULATORY_CARE_PROVIDER_SITE_OTHER): Payer: Medicare Other | Admitting: Cardiovascular Disease

## 2021-12-19 ENCOUNTER — Encounter: Payer: Self-pay | Admitting: Cardiovascular Disease

## 2021-12-19 ENCOUNTER — Encounter (HOSPITAL_BASED_OUTPATIENT_CLINIC_OR_DEPARTMENT_OTHER): Payer: Medicare Other | Admitting: Internal Medicine

## 2021-12-19 DIAGNOSIS — E11621 Type 2 diabetes mellitus with foot ulcer: Secondary | ICD-10-CM | POA: Diagnosis not present

## 2021-12-19 DIAGNOSIS — E785 Hyperlipidemia, unspecified: Secondary | ICD-10-CM | POA: Diagnosis not present

## 2021-12-19 DIAGNOSIS — M86671 Other chronic osteomyelitis, right ankle and foot: Secondary | ICD-10-CM | POA: Diagnosis not present

## 2021-12-19 DIAGNOSIS — I10 Essential (primary) hypertension: Secondary | ICD-10-CM

## 2021-12-19 DIAGNOSIS — I6523 Occlusion and stenosis of bilateral carotid arteries: Secondary | ICD-10-CM | POA: Diagnosis not present

## 2021-12-19 DIAGNOSIS — Z951 Presence of aortocoronary bypass graft: Secondary | ICD-10-CM | POA: Diagnosis not present

## 2021-12-19 DIAGNOSIS — I70229 Atherosclerosis of native arteries of extremities with rest pain, unspecified extremity: Secondary | ICD-10-CM | POA: Diagnosis not present

## 2021-12-19 DIAGNOSIS — T8131XD Disruption of external operation (surgical) wound, not elsewhere classified, subsequent encounter: Secondary | ICD-10-CM | POA: Diagnosis not present

## 2021-12-19 DIAGNOSIS — L97514 Non-pressure chronic ulcer of other part of right foot with necrosis of bone: Secondary | ICD-10-CM

## 2021-12-19 DIAGNOSIS — E11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: Secondary | ICD-10-CM | POA: Diagnosis not present

## 2021-12-19 DIAGNOSIS — I6521 Occlusion and stenosis of right carotid artery: Secondary | ICD-10-CM

## 2021-12-19 LAB — GLUCOSE, CAPILLARY
Glucose-Capillary: 183 mg/dL — ABNORMAL HIGH (ref 70–99)
Glucose-Capillary: 227 mg/dL — ABNORMAL HIGH (ref 70–99)

## 2021-12-19 NOTE — Patient Instructions (Signed)
Medication Instructions:  ?Your physician recommends that you continue on your current medications as directed. Please refer to the Current Medication list given to you today. ? ?*If you need a refill on your cardiac medications before your next appointment, please call your pharmacy* ? ? ?Testing/Procedures: ?Your physician has requested that you have a carotid duplex. This test is an ultrasound of the carotid arteries in your neck. It looks at blood flow through these arteries that supply the brain with blood. Allow one hour for this exam. There are no restrictions or special instructions. To be done in March 2024. This procedure is done at [place]. [address] ? ? ? ?Follow-Up: ?At [facility], you and your health needs are our priority.  As part of our continuing mission to provide you with exceptional heart care, we have created designated Provider Care Teams.  These Care Teams include your primary Cardiologist (physician) and Advanced Practice Providers (APPs -  Physician Assistants and Nurse Practitioners) who all work together to provide you with the care you need, when you need it. ? ?We recommend signing up for the patient portal called "MyChart".  Sign up information is provided on this After Visit Summary.  MyChart is used to connect with patients for Virtual Visits (Telemedicine).  Patients are able to view lab/test results, encounter notes, upcoming appointments, etc.  Non-urgent messages can be sent to your provider as well.   ?To learn more about what you can do with MyChart, go to [email or web address].   ? ?Your next appointment:   ?12 month(s) ? ?The format for your next appointment:   ?In Person ? ?Provider:   ?[name], MD  ?

## 2021-12-19 NOTE — Progress Notes (Signed)
[name], [name] ([id number]) ?Visit Report for 12/18/2021 ?Problem List Details ?Patient Name: Date of Service: ?[name], [name] 12/18/2021 12:30 PM ?Medical Record Number: [id number] ?Patient Account Number: [id number] ?Date of Birth/Sex: Treating RN: ?18-Jul-1941 (81 y.o. 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 06/15/2021 No Yes ?Y37.096 Other chronic osteomyelitis, right ankle and foot 10/25/2021 No Yes ?Inactive Problems ?ICD-10 ?Code Description Active Date Inactive Date ?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 12/18/2021 ?Medical Record Number: [id number] ?Patient Account Number: [id number] ?Date of Birth/Sex: ?Treating RN: ?[birth year] (81 y.o. 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 ?R75.436 Other chronic osteomyelitis, right ankle and foot ?Facility Procedures ?CPT4 Code: 06770340 ?Description: G0277-(Facility Use Only) HBOT full body chamber, 48mn , ICD-10 Diagnosis Description 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 M86.671 Other  ?chronic osteomyelitis, right ankle and foot T81.31XD Disruption of external operation (surgical) wound, not elsewhere classified, subse ?Modifier: quent encounter ?Quantity: 4 ?Physician Procedures ?: CPT4 Code Description Modifier [id number] 59093- WC PHYS HYPERBARIC OXYGEN THERAPY ICD-10 Diagnosis Description 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

## 2021-12-19 NOTE — Progress Notes (Signed)
[name], HAUGHN ([id number]) ?Visit Report for 12/18/2021 ?Arrival Information Details ?Patient Name: Date of Service: ?[name], [name] 12/18/2021 12:30 PM ?Medical Record Number: [id number] ?Patient Account Number: [id number] ?Date of Birth/Sex: Treating RN: ?08/10/1941 (81 y.o. [name], [name] ?Primary Care Britten Parady: [name] ?Other Clinician: [name] ?Referring Kaly Mcquary: ?Treating Kolina Kube/[name]: [name] ?[name] ?Weeks in Treatment: 26 ?Visit Information History Since Last Visit ?All ordered tests and consults were completed: Yes ?Patient Arrived: [name] ?Added or deleted any medications: No ?Arrival Time: 12:36 ?Any new allergies or adverse reactions: No ?Accompanied By: self ?Had a fall or experienced change in No ?Transfer Assistance: None ?activities of daily living that may affect ?Patient Identification Verified: Yes ?risk of falls: ?Secondary Verification Process Completed: Yes ?Signs or symptoms of abuse/neglect since last visito No ?Patient Requires Transmission-Based Precautions: No ?Hospitalized since last visit: No ?Patient Has Alerts: Yes ?Implantable device outside of the clinic excluding No ?Patient Alerts: Patient on Blood Thinner cellular tissue based products placed in the center ?since last visit: ?Pain Present Now: No ?Electronic Signature(s) ?Signed: 12/19/2021 11:26:48 AM By: [name] CHT EMT BS ?, , ?Entered By: [name] on 12/18/2021 13:52:12 ?-------------------------------------------------------------------------------- ?Encounter Discharge Information Details ?Patient Name: Date of Service: ?[name], [name] 12/18/2021 12:30 PM ?Medical Record Number: [id number] ?Patient Account Number: [id number] ?Date of Birth/Sex: Treating RN: ?25-Oct-1940 (81 y.o. [name], [name] ?Primary Care Antoniette Peake: [name] ?Other Clinician: [name] ?Referring Heston Widener: ?Treating Vishruth Seoane/[name]: [name] ?[name] ?Weeks in Treatment: 26 ?Encounter Discharge Information Items ?Discharge Condition: Stable ?Ambulatory Status: [name] ?Discharge Destination: Home ?Transportation: Private Auto ?Accompanied By: None ?Schedule Follow-up Appointment: Yes ?Clinical Summary of Care: ?Electronic Signature(s) ?Signed: 12/18/2021 3:46:09 PM By: [name] EMT ?Entered By: [name] on 12/18/2021 15:46:08 ?-------------------------------------------------------------------------------- ?Vitals Details ?Patient Name: ?Date of Service: ?[name], [name] 12/18/2021 12:30 PM ?Medical Record Number: [id number] ?Patient Account Number: [id number] ?Date of Birth/Sex: ?Treating RN: ?[birth year] (81 y.o. [name], [name] ?Primary Care Trevaun Rendleman: [name] ?Other Clinician: [name] ?Referring Kealey Kemmer: ?Treating Somnang Mahan/[name]: [name] ?[name] ?Weeks in Treatment: 26 ?Vital Signs ?Time Taken: 12:58 ?Temperature (??F): 98.2 ?Height (in): 64 ?Pulse (bpm): 83 ?Weight (lbs): 135 ?Respiratory Rate (breaths/min): 16 ?Body Mass Index (BMI): 23.2 ?Blood Pressure (mmHg): 162/62 ?Capillary Blood Glucose (mg/dl): 215 ?Reference Range: 80 - 120 mg / dl ?Airway ?Pulse Oximetry (%): 99 ?Electronic Signature(s) ?Signed: 12/19/2021 11:26:48 AM By: [name] CHT EMT BS ?, , ?Entered By: [name] on 12/18/2021 13:53:18 ?

## 2021-12-19 NOTE — Progress Notes (Signed)
SAMYUKTHA, [name] ([id number]) ?Visit Report for 12/19/2021 ?Problem List Details ?Patient Name: Date of Service: ?[name], [name] 12/19/2021 1:00 PM ?Medical Record Number: [id number] ?Patient Account Number: [id number] ?Date of Birth/Sex: Treating RN: ?06-17-1941 (81 y.o. [name], [name] ?Primary Care Provider: [name] ?Other Clinician: [name] ?Referring Provider: ?Treating Provider/[name]: [name] ?[name] ?Weeks in Treatment: 26 ?Active Problems ?ICD-10 ?Encounter ?Code Description Active Date MDM ?Diagnosis ?E11.621 Type 2 diabetes mellitus with foot ulcer 06/15/2021 No Yes ?T81.31XD Disruption of external operation (surgical) wound, not elsewhere classified, 06/15/2021 No Yes ?subsequent encounter ?L97.514 Non-pressure chronic ulcer of other part of right foot with necrosis of bone 09/20/2021 No Yes ?E11.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 06/15/2021 No Yes ?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, right ankle and foot 10/25/2021 No Yes ?Inactive Problems ?ICD-10 ?Code Description Active Date Inactive Date ?L97.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 12/19/2021 ?Medical Record Number: [id number] ?Patient Account Number: [id number] ?Date of Birth/Sex: ?Treating RN: ?02-27-1941 (81 y.o. 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 ?G92.010 Other chronic osteomyelitis, right ankle and foot ?Facility Procedures ?CPT4 Code: 07121975 ?Description: G0277-(Facility Use Only) HBOT full body chamber, 83mn , ICD-10 Diagnosis Description 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 M86.671 Other  ?chronic osteomyelitis, right ankle and foot T81.31XD Disruption of external operation (surgical) wound, not elsewhere classified, subse ?Modifier: quent encounter ?Quantity: 4 ?Physician Procedures ?: CPT4 Code Description Modifier [id number] PHYS HYPERBARIC OXYGEN THERAPY ICD-10 Diagnosis Description 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

## 2021-12-19 NOTE — Assessment & Plan Note (Signed)
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 Julien Girt 03/24/2020 with an excellent result.  She denies chest pain or shortness of breath. ?

## 2021-12-19 NOTE — Assessment & Plan Note (Signed)
History of dyslipidemia on high-dose statin therapy with lipid profile performed 10/14/2021 revealing total cholesterol 97, LDL 50 and HDL 29. ?

## 2021-12-19 NOTE — Progress Notes (Signed)
ARCELIA, PALS ([id number]) ?Visit Report for 12/18/2021 ?HBO Details ?Patient Name: Date of Service: ?[name], [name] 12/18/2021 12:30 PM ?Medical Record Number: [id number] ?Patient Account Number: [id number] ?Date of Birth/Sex: Treating RN: ?07-07-1941 (81 y.o. 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 ?HBO Treatment Details ?Treatment Number: 28 ?Patient Type: Outpatient ?Chamber Type: Monoplace ?Chamber Serial #: [id number] ?Treatment Protocol: 2.0 ATA with 90 minutes oxygen, and no air breaks ?Treatment Details ?Compression Rate Down: 2.0 psi / minute ?De-Compression Rate Up: 2.0 psi / minute ?Air ?breaks ?and ?breathing Decompress Decompress ?Compress Tx Pressure ?Begins Reached periods Begins Ends ?(leave ?unused ?spaces ?blank) ?Chamber Pressure (ATA 1 2 ------2 1 ?) ?Clock Time (24 hr) 13:01 13:11 - - - - - - 14:41 14:58 ?Treatment Length: 117 (minutes) ?Treatment Segments: 4 ?Vital Signs ?Capillary Blood Glucose Reference Range: 80 - 120 mg / dl ?HBO Diabetic Blood Glucose Intervention Range: <131 mg/dl or >249 mg/dl ?Time Vitals Blood Respiratory Capillary Blood Glucose Pulse Action ?Type: ?Pulse: Temperature: ?Taken: ?Pressure: ?Rate: ?Glucose (mg/dl): ?Meter #: Oximetry (%) Taken: ?Pre 12:58 162/62 83 16 98.2 215 99 ?Post 15:06 152/84 83 16 97.9 149 ?Treatment Response ?Treatment Toleration: Well ?Treatment Completion Status: Treatment Completed without Adverse Event ?Treatment Notes ?The  patient did well today with her treatment. After her treatment, I asked her if she had any ear or sinus problems. She said no. ?Additional Procedure Documentation ?Tissue Sevierity: Fat layer exposed ?Physician HBO Attestation: ?I certify that I supervised this HBO treatment in accordance with Medicare ?guidelines. A trained emergency response team is readily available per Yes ?hospital policies and procedures. ?Continue HBOT as ordered. Yes ?Electronic Signature(s) ?Signed: 12/19/2021 9:23:40 AM By: [name] DO ?Previous Signature: 12/18/2021 3:47:38 PM Version By: [name] EMT ?Previous Signature: 12/18/2021 3:44:34 PM Version By: [name] EMT ?Previous Signature: 12/18/2021 3:44:03 PM Version By: [name] EMT ?Entered By: [name] on 12/19/2021 09:06:24 ?-------------------------------------------------------------------------------- ?HBO Safety Checklist Details ?Patient Name: ?Date of Service: ?[name], [name] 12/18/2021 12:30 PM ?Medical Record Number: [id number] ?Patient Account Number: [id number] ?Date of Birth/Sex: ?Treating RN: ?Dec 11, 1940 (81 y.o. [name], [name] ?Primary Care Cochise Dinneen: [name] ?Other Clinician: [name] ?Referring Markcus Lazenby: ?Treating Eurydice Calixto/[name]: [name] ?[name] ?Weeks in Treatment: 26 ?HBO Safety Checklist Items ?Safety Checklist ?Consent Form Signed ?Patient voided / foley secured and emptied ?When did you last eato 0830 ?Last dose of injectable or oral agent 0830 ?Ostomy pouch emptied and vented if applicable ?NA ?All implantable devices assessed, documented and approved ?NA ?Intravenous access site secured and place ?NA ?Valuables secured ?Linens and cotton and cotton/polyester blend (less than 51% polyester) ?Personal oil-based products / skin lotions / body lotions removed ?Wigs or hairpieces removed Patient removed item. ?Smoking or tobacco materials removed ?NA ?Books / newspapers / magazines / loose  paper removed ?Cologne, aftershave, perfume and deodorant removed ?Jewelry removed (may wrap wedding band) ?Make-up removed ?Hair care products removed ?Battery operated devices (external) removed ?Heating patches and chemical warmers removed ?Titanium eyewear removed ?NA ?Nail polish cured greater than 10 hours ?Casting material cured greater than 10 hours ?NA ?  Hearing aids removed ?NA ?Loose dentures or partials removed ?NA ?Prosthetics have been removed ?NA ?Patient demonstrates correct use of air break device (if applicable) ?Patient concerns have been addressed ?Patient grounding bracelet on and cord attached to chamber ?Specifics for Inpatients (complete in addition to above) ?Medication sheet sent with patient ?NA ?Intravenous medications needed or due during therapy sent with patient ?NA ?Drainage tubes (e.g. nasogastric tube or chest tube secured and vented) ?NA ?Endotracheal or Tracheotomy tube secured ?NA ?Cuff deflated of air and inflated with saline ?NA ?Airway suctioned ?NA ?Notes ?Paper version was used prior to treatment. ?Electronic Signature(s) ?Signed: 12/19/2021 11:26:48 AM By: [name] CHT EMT BS ?, , ?Entered By: [name] on 12/18/2021 13:56:36 ?

## 2021-12-19 NOTE — Progress Notes (Addendum)
KALIKA, SMAY ([id number]) ?Visit Report for 12/19/2021 ?Arrival Information Details ?Patient Name: Date of Service: ?[name], [name] 12/19/2021 1:00 PM ?Medical Record Number: [id number] ?Patient Account Number: [id number] ?Date of Birth/Sex: Treating RN: ?09/27/1940 (81 y.o. [name], [name] ?Primary Care Shakesha Soltau: [name] ?Other Clinician: [name] ?Referring Persia Lintner: ?Treating Zulay Corrie/[name]: [name] ?[name] ?Weeks in Treatment: 26 ?Visit Information History Since Last Visit ?All ordered tests and consults were completed: Yes ?Patient Arrived: Ambulatory ?Added or deleted any medications: No ?Arrival Time: 12:40 ?Any new allergies or adverse reactions: No ?Accompanied By: None ?Had a fall or experienced change in No ?Transfer Assistance: None ?activities of daily living that may affect ?Patient Identification Verified: Yes ?risk of falls: ?Secondary Verification Process Completed: Yes ?Signs or symptoms of abuse/neglect since last visito No ?Patient Requires Transmission-Based Precautions: No ?Hospitalized since last visit: No ?Patient Has Alerts: Yes ?Implantable device outside of the clinic excluding No ?Patient Alerts: Patient on Blood Thinner cellular tissue based products placed in the center ?since last visit: ?Pain Present Now: No ?Notes ?Paper version was used prior to the treatment. ?Electronic Signature(s) ?Signed: 12/19/2021 1:26:35 PM By: [name] EMT ?Entered By: [name] on 12/19/2021 13:26:35 ?-------------------------------------------------------------------------------- ?Encounter Discharge Information Details ?Patient Name: Date of Service: ?[name], [name] 12/19/2021 1:00 PM ?Medical Record Number: [id number] ?Patient Account Number: [id number] ?Date of Birth/Sex: Treating RN: ?Feb 24, 1941 (81 y.o. [name], [name] ?Primary Care Jessey Stehlin: [name] ?Other Clinician: [name] ?Referring Jawaun Celmer: ?Treating Marija Calamari/[name]:  [name] ?[name] ?Weeks in Treatment: 26 ?Encounter Discharge Information Items ?Discharge Condition: Stable ?Ambulatory Status: Ambulatory ?Discharge Destination: Home ?Transportation: Ambulance ?Accompanied By: None ?Schedule Follow-up Appointment: Yes ?Clinical Summary of Care: ?Electronic Signature(s) ?Signed: 12/19/2021 3:25:06 PM By: [name] EMT ?Entered By: [name] on 12/19/2021 15:25:05 ?-------------------------------------------------------------------------------- ?Vitals Details ?Patient Name: ?Date of Service: ?[name], [name] 12/19/2021 1:00 PM ?Medical Record Number: [id number] ?Patient Account Number: [id number] ?Date of Birth/Sex: ?Treating RN: ?Apr 21, 1941 (81 y.o. [name], [name] ?Primary Care Acelynn Dejonge: [name] ?Other Clinician: [name] ?Referring Frederica Chrestman: ?Treating Katlynne Mckercher/[name]: [name] ?[name] ?Weeks in Treatment: 26 ?Vital Signs ?Time Taken: 13:01 ?Temperature (??F): 97.5 ?Height (in): 64 ?Pulse (bpm): 74 ?Weight (lbs): 135 ?Respiratory Rate (breaths/min): 16 ?Body Mass Index (BMI): 23.2 ?Blood Pressure (mmHg): 128/60 ?Capillary Blood Glucose (mg/dl): 183 ?Reference Range: 80 - 120 mg / dl ?Airway ?Pulse Oximetry (%): 100 ?Notes ?Paper version was used prior to the treatment. ?Electronic Signature(s) ?Signed: 12/19/2021 1:27:37 PM By: [name] EMT ?Entered By: [name] on 12/19/2021 13:27:37 ?

## 2021-12-19 NOTE — Assessment & Plan Note (Signed)
.  History of essential hypertension a blood pressure measured today at 120/50 she is on hydrochlorothiazide, metoprolol and Micardis. ?

## 2021-12-19 NOTE — Assessment & Plan Note (Signed)
History of critical limb ischemia status post multiple vascular interventions on both legs.  She has had bilateral TMA's.  Her most recent intervention occurred by Dr. Stanford Breed 10/13/2021 because of wound on her right TMA suture line.  She had stenting of her right extrailiac artery and intervention on her right popliteal and peroneal artery.  This provided inline flow.  Since that time she has had hyperbaric therapy and 8 weeks of IV antibiotics and her wound is healed.  She has been taken care of by the wound care center and by Dr. Graylon Good and infectious disease. ?

## 2021-12-19 NOTE — Assessment & Plan Note (Signed)
History of carotid artery disease with Doppler study performed 11/30/2021 revealing moderate right ICA stenosis.  This will be repeated on an annual basis. ?

## 2021-12-19 NOTE — Progress Notes (Addendum)
REMMIE, BEMBENEK ([id number]) ?Visit Report for 12/19/2021 ?HBO Details ?Patient Name: Date of Service: ?[name], [name] 12/19/2021 1:00 PM ?Medical Record Number: [id number] ?Patient Account Number: [id number] ?Date of Birth/Sex: Treating RN: ?1941/01/24 (81 y.o. 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 ?HBO Treatment Details ?Treatment Number: 29 ?Patient Type: Outpatient ?Chamber Type: Monoplace ?Chamber Serial #: [id number] ?Treatment Protocol: 2.0 ATA with 90 minutes oxygen, and no air breaks ?Treatment Details ?Compression Rate Down: 2.0 psi / minute ?De-Compression Rate Up: 2.0 psi / minute ?Air ?breaks ?and ?breathing Decompress Decompress ?Compress Tx Pressure ?Begins Reached periods Begins Ends ?(leave ?unused ?spaces ?blank) ?Chamber Pressure (ATA 1 2 ------2 1 ?) ?Clock Time (24 hr) 13:06 13:14 - - - - - - 14:44 14:52 ?Treatment Length: 106 (minutes) ?Treatment Segments: 4 ?Vital Signs ?Capillary Blood Glucose Reference Range: 80 - 120 mg / dl ?HBO Diabetic Blood Glucose Intervention Range: <131 mg/dl or >249 mg/dl ?Time Vitals Blood Respiratory Capillary Blood Glucose Pulse Action ?Type: ?Pulse: Temperature: ?Taken: ?Pressure: ?Rate: ?Glucose (mg/dl): ?Meter #: Oximetry (%) Taken: ?Pre 13:01 128/60 74 16 97.5 183 100 ?Post 15:03 149/79 77 16 97.7 227 ?Treatment Response ?Treatment Toleration: Well ?Treatment Completion Status: Treatment Completed without Adverse Event ?Treatment Notes ?The patient  did well today with her treatment. She was asked after her treatment if she had any sinus or ear problems. She stated no. ?Additional Procedure Documentation ?Tissue Sevierity: Limited to breakdown of skin ?Physician HBO Attestation: ?I certify that I supervised this HBO treatment in accordance with Medicare ?guidelines. A trained emergency response team is readily available per Yes ?hospital policies and procedures. ?Continue HBOT as ordered. Yes ?Electronic Signature(s) ?Signed: 12/19/2021 4:28:29 PM By: [name] DO ?Previous Signature: 12/19/2021 3:33:10 PM Version By: [name] EMT ?Previous Signature: 12/19/2021 3:31:08 PM Version By: [name] EMT ?Previous Signature: 12/19/2021 3:23:14 PM Version By: [name] EMT ?Previous Signature: 12/19/2021 1:34:06 PM Version By: [name] EMT ?Entered By: [name] on 12/19/2021 16:21:56 ?-------------------------------------------------------------------------------- ?HBO Safety Checklist Details ?Patient Name: ?Date of Service: ?[name], [name] 12/19/2021 1:00 PM ?Medical Record Number: [id number] ?Patient Account Number: [id number] ?Date of Birth/Sex: ?Treating RN: ?1941/07/17 (81 y.o. [name], [name] ?Primary Care Mamoudou Mulvehill: [name] ?Other Clinician: [name] ?Referring Iviana Blasingame: ?Treating Dmiyah Liscano/[name]: [name] ?[name] ?Weeks in Treatment: 26 ?HBO Safety Checklist Items ?Safety Checklist ?Consent Form Signed ?Patient voided / foley secured and emptied ?When did you last eato 1210 ?Last dose of injectable or oral agent 0815 ?Ostomy pouch emptied and vented if applicable ?NA ?All implantable devices assessed, documented and approved ?NA ?Intravenous access site secured and place ?NA ?Valuables secured ?Linens and cotton and cotton/polyester blend (less than 51% polyester) ?Personal oil-based products / skin lotions / body lotions removed ?Wigs or hairpieces removed ?Smoking or tobacco materials  removed ?Books / newspapers / magazines / loose paper removed ?Cologne, aftershave, perfume and deodorant removed ?Jewelry removed (may wrap wedding band) ?NA ?Make-up removed ?Hair care products removed ?Battery operated devices (external) removed ?Heating patches and chemical warmers removed ?Titanium eyewear removed ?NA ?Nail polish cured greater than 10  hours ?NA ?Casting material cured greater than 10 hours ?NA ?Hearing aids removed ?NA ?Loose dentures or partials removed ?NA ?Prosthetics have been removed ?NA ?Patient demonstrates correct use of air break device (if applicable) ?Patient concerns have been addressed ?Patient grounding bracelet on and cord attached to chamber ?Specifics for Inpatients (complete in addition to above) ?Medication sheet sent with patient ?NA ?Intravenous medications needed or due during therapy sent with patient ?NA ?Drainage tubes (e.g. nasogastric tube or chest tube secured and vented) ?NA ?Endotracheal or Tracheotomy tube secured ?NA ?Cuff deflated of air and inflated with saline ?NA ?Airway suctioned ?NA ?Electronic Signature(s) ?Signed: 12/19/2021 1:29:48 PM By: [name] EMT ?Entered By: [name] on 12/19/2021 13:29:47 ?

## 2021-12-19 NOTE — Progress Notes (Signed)
[age] ? ? ? ?12/19/2021 ?Iara H Hendry   ?[birth year]  ?[id number] ? ?Primary Physician [name], MD ?Primary Cardiologist: [name] MD [name], [place] ? ?HPI:  [name] is a 81 y.o.  mildly overweight widowed Caucasian female mother of 49, grandmother 7 grandchildren referred by Dr. Jacqualyn Posey, her podiatrist for critical limb ischemia.    I last saw her in the office   08/22/2021. She has a history of treated hypertension, diabetes and hyperlipidemia.  She is never had a heart attack or stroke.  She denies chest pain or shortness of breath.  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  She has some local trauma related to socks over Christmas and has developed ischemic appearing wounds on her right second and third toes with recent Dopplers performed 10/09/2018 revealing a right ABI 0.75, left 2.79 with moderately severe right SFA stenosis and an occluded right posterior tibial artery.  She is seeing Dr. Jacqualyn Posey back regularly for aggressive local wound care. ?  ? Her wounds continue to heal on her right and left second toes.  These sores are probably a result of local trauma.  Recent ABIs are in the 0.6  range with a high-grade right SFA stenosis and an occluded left popliteal with tibial vessel disease as well.  She does complain of some claudication.  She wishes to proceed with endovascular therapy for wound healing and lifestyle limiting claudication. ?  ?I performed angiography on her 06/15/2019 with a failed attempt at crossing the left SFA CTO.  She has one-vessel runoff bilaterally.  She did develop gangrenous right first and second toes.  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 Trula Slade who performed right common femoral endarterectomy with patch angioplasty 07/21/2019 with excellent result.  She did see Dr. Jacqualyn Posey back in the  office late last week who thought her wounds were stable.  The pain in her feet has improved since her endarterectomy.  The plan is to perform orbital atherectomy and PTA of her calcified right SFA as well as intervention on her right tibioperoneal trunk prior to right TMA by Dr. Earleen Newport for limb salvage. ?  ?I performed orbital atherectomy followed by drug-coated balloon angioplasty of her right SFA, popliteal and tibioperoneal trunk on 08/03/2019 with excellent result.  Her follow-up Dopplers performed 08/14/2019 revealed a widely patent SFA and tibioperoneal trunk.  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 Jacqualyn Posey on 08/07/2019 which almost completely healed with the assistance of hyperbaric oxygen. ?  ?She has been seeing Dr. Dellia Nims in the wound care clinic on a weekly basis.  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  She is on 2 IV antibiotics and continues to get hyperbaric oxygen.  Dr. Dellia Nims does not feel these will heal without inline flow.  The patient is a candidate for tibial pedal access of the left dorsalis pedis.  I reviewed her anatomy with Dr. Fletcher Anon who i performed tibial pedal intervention in March of this year resulting healing of her left foot wound. She did undergo right TMA by Dr. Jacqualyn Posey which healed nicely. ?  ?She was admitted with non-STEMI on 03/13/2020 and underwent cardiac catheterization by Dr. Martinique revealing three-vessel disease. 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 Julien Girt 03/24/2020 with an excellent result and  was discharged 1 week later. She is recuperated nicely. She does have a small nonhealing wound and a vein harvest site in her right calf. She denies claudication. Recent Dopplers performed 06/08/2020 did reveal an occluded right SFA although she was asymptomatic from this. ?  ?Unfortunately she had developed breakdown of her right TMA healed wound now with a small open wound that has been draining.  She  does get pain in her leg when she walks.  Based on this I decided to proceed with urgent angiography potential endovascular therapy for limb salvage.  I angiogrammed her 08/01/2020 revealing an occluded right SFA from the origin to the adductor canal with one-vessel runoff via peroneal.  She did have an 80% tibioperoneal trunk stenosis and 80% restenosis in her prior common femoral endarterectomy site.  She underwent right common femoral endarterectomy with right femoral to above-the-knee popliteal bypass graft by Dr. Donzetta Matters.  She recuperated nicely.  Her TMA wound healed.  Her most recent Doppler studies performed 09/30/2020 revealed a widely patent graft. ? ?Since I saw her a year ago she did have a left TMA.  She had wound VAC breakdown on her right TMA suture site and underwent Rie angiography by Dr. Stanford Breed 10/13/2021 with a complex endovascular intervention.  This resulted in ultimate healing of her wound with the help of hyperbaric therapy at the wound care center and 8 weeks of IV antibiotics.  She walks with a cane and plays pool 3 nights a week.  She denies chest pain or shortness of breath. ?  ? ? ?Current Meds  ?Medication Sig  ? acetaminophen (TYLENOL) 500 MG tablet Take 1,000 mg by mouth every 6 (six) hours as needed for moderate pain or headache.  ? aspirin EC 81 MG tablet Take 81 mg by mouth at bedtime.   ? atorvastatin (LIPITOR) 80 MG tablet TAKE 1 TABLET BY MOUTH ONCE DAILY. PATIENT  MUST  KEEP  UPCOMING  APPOINTMENT  FOR  FUTURE  REFILLS  ? brimonidine (ALPHAGAN) 0.2 % ophthalmic solution Place 1 drop into the right eye 2 (two) times daily.  ? CINNAMON PO Take 2 tablets by mouth in the morning, at noon, and at bedtime.   ? empagliflozin (JARDIANCE) 25 MG TABS tablet Take 1 tablet (25 mg total) by mouth daily before breakfast.  ? FEROCON capsule Take 1 capsule by mouth 2 (two) times daily.  ? fluconazole (DIFLUCAN) 150 MG tablet Take 1 tablet (150 mg total) by mouth daily. Can take 1 tab every 3 days if  still having symptoms  ? glimepiride (AMARYL) 2 MG tablet TAKE 1 TABLET BY MOUTH ONCE DAILY BEFORE BREAKFAST  ? glucose blood (ONETOUCH VERIO) test strip Use to check blood sugars once daily  ? hydrochlorothiazide (HYDRODIURIL) 12.5 MG tablet Take 1 tablet by mouth once daily  ? Lancets (ONETOUCH DELICA PLUS [id number]) MISC Use to check blood sugars twice daily.  ? metFORMIN (GLUCOPHAGE) 1000 MG tablet Take 1 tablet by mouth twice daily (Patient taking differently: Take 1,000 mg by mouth in the morning.)  ? metoprolol tartrate (LOPRESSOR) 25 MG tablet TAKE 1 & 1/2 (ONE & ONE-HALF) TABLETS BY MOUTH TWICE DAILY  ? NON FORMULARY Prisma topical ointment for right foot  ? pantoprazole (PROTONIX) 40 MG tablet Take 1 tablet (40 mg total) by mouth daily. (Patient taking differently: Take 40 mg by mouth every Monday, Wednesday, and Friday.)  ? Probiotic Product ([employer] SENIOR PROBIOTIC PO) Take 1 capsule by mouth every evening.  ? Propylene Glycol (  SYSTANE BALANCE) 0.6 % SOLN Place 1 drop into the left eye 3 (three) times daily as needed (dry/irritated eyes).  ? pyridoxine (B-6) 100 MG tablet Take 100 mg by mouth every evening.   ? repaglinide (PRANDIN) 1 MG tablet Take 1 tablet (1 mg total) by mouth daily before breakfast.  ? telmisartan (MICARDIS) 80 MG tablet Take 1 tablet by mouth once daily  ? XARELTO 2.5 MG TABS tablet Take 1 tablet by mouth twice daily  ? ?Current Facility-Administered Medications for the 12/19/21 encounter (Office Visit) with [name], MD  ?Medication  ? sodium chloride flush (NS) 0.9 % injection 3 mL  ?  ? ?Allergies  ?Allergen Reactions  ? Augmentin [Amoxicillin-Pot Clavulanate] Other (See Comments)  ?  Elevates potassium level  ? Bactrim [Sulfamethoxazole-Trimethoprim] Other (See Comments)  ?  ^ K+( elevated)   ? Demerol [Meperidine]   ?  Delusional   ? Scopolamine   ?  Delusional   ? Tramadol Other (See Comments)  ?  Keeps awake  ? Versed [Midazolam] Anxiety  ?  Frantic, out of my mind,  agitated   ? ? ?Social History  ? ?Socioeconomic History  ? Marital status: Widowed  ?  Spouse name: Not on file  ? Number of children: Not on file  ? Years of education: Not on file  ? Highest education level:

## 2021-12-20 ENCOUNTER — Encounter (HOSPITAL_BASED_OUTPATIENT_CLINIC_OR_DEPARTMENT_OTHER): Payer: Medicare Other | Admitting: General Surgery

## 2021-12-20 DIAGNOSIS — L97511 Non-pressure chronic ulcer of other part of right foot limited to breakdown of skin: Secondary | ICD-10-CM | POA: Diagnosis not present

## 2021-12-20 DIAGNOSIS — M86671 Other chronic osteomyelitis, right ankle and foot: Secondary | ICD-10-CM | POA: Diagnosis not present

## 2021-12-20 DIAGNOSIS — L97522 Non-pressure chronic ulcer of other part of left foot with fat layer exposed: Secondary | ICD-10-CM | POA: Diagnosis not present

## 2021-12-20 DIAGNOSIS — E11621 Type 2 diabetes mellitus with foot ulcer: Secondary | ICD-10-CM | POA: Diagnosis not present

## 2021-12-20 DIAGNOSIS — L97514 Non-pressure chronic ulcer of other part of right foot with necrosis of bone: Secondary | ICD-10-CM | POA: Diagnosis not present

## 2021-12-20 DIAGNOSIS — T8131XD Disruption of external operation (surgical) wound, not elsewhere classified, subsequent encounter: Secondary | ICD-10-CM | POA: Diagnosis not present

## 2021-12-20 DIAGNOSIS — E11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: Secondary | ICD-10-CM | POA: Diagnosis not present

## 2021-12-20 DIAGNOSIS — L97512 Non-pressure chronic ulcer of other part of right foot with fat layer exposed: Secondary | ICD-10-CM | POA: Diagnosis not present

## 2021-12-20 LAB — GLUCOSE, CAPILLARY
Glucose-Capillary: 168 mg/dL — ABNORMAL HIGH (ref 70–99)
Glucose-Capillary: 103 mg/dL — ABNORMAL HIGH (ref 70–99)

## 2021-12-20 NOTE — Progress Notes (Signed)
SORIYAH, OSBERG ([id number]) ?Visit Report for 12/20/2021 ?Arrival Information Details ?Patient Name: Date of Service: ?[name], [name] 12/20/2021 12:30 PM ?Medical Record Number: [id number] ?Patient Account Number: [id number] ?Date of Birth/Sex: Treating RN: ?Dec 10, 1940 (81 y.o. F) [name], [name] ?Primary Care Kamarah Bilotta: [name] Other Clinician: ?Referring Basil Blakesley: ?Treating Chaney Maclaren/[name]: [name] ?[name] ?Weeks in Treatment: 26 ?Visit Information History Since Last Visit ?Added or deleted any medications: No ?Patient Arrived: Ambulatory ?Any new allergies or adverse reactions: No ?Arrival Time: 13:00 ?Had a fall or experienced change in No ?Accompanied By: self ?activities of daily living that may affect ?Transfer Assistance: None ?risk of falls: ?Patient Requires Transmission-Based Precautions: No ?Signs or symptoms of abuse/neglect since last visito No ?Patient Has Alerts: Yes ?Hospitalized since last visit: No ?Patient Alerts: Patient on Blood Thinner ?Implantable device outside of the clinic excluding No ?cellular tissue based products placed in the center ?since last visit: ?Has Dressing in Place as Prescribed: Yes ?Pain Present Now: No ?Electronic Signature(s) ?Signed: 12/20/2021 4:34:38 PM By: Deon Pilling RN, BSN ?Entered By: Deon Pilling on 12/20/2021 13:27:32 ?-------------------------------------------------------------------------------- ?Encounter Discharge Information Details ?Patient Name: Date of Service: ?[name], [name] 12/20/2021 12:30 PM ?Medical Record Number: [id number] ?Patient Account Number: [id number] ?Date of Birth/Sex: Treating RN: ?Dec 21, 1940 (81 y.o. F) [name], [name] ?Primary Care Josede Cicero: [name] Other Clinician: ?Referring Kemiyah Tarazon: ?Treating Tyus Kallam/[name]: [name] ?[name] ?Weeks in Treatment: 26 ?Encounter Discharge Information Items ?Discharge Condition: Stable ?Ambulatory Status: Ambulatory ?Discharge  Destination: Home ?Transportation: Private Auto ?Accompanied By: self ?Schedule Follow-up Appointment: Yes ?Clinical Summary of Care: ?Electronic Signature(s) ?Signed: 12/20/2021 4:34:38 PM By: Deon Pilling RN, BSN ?Entered By: Deon Pilling on 12/20/2021 16:09:16 ?-------------------------------------------------------------------------------- ?Vitals Details ?Patient Name: ?Date of Service: ?[name], [name] 12/20/2021 12:30 PM ?Medical Record Number: [id number] ?Patient Account Number: [id number] ?Date of Birth/Sex: ?Treating RN: ?10-28-1940 (81 y.o. F) [name], [name] ?Primary Care Zamir Staples: [name] ?Other Clinician: ?Referring Shonte Beutler: ?Treating Hannah Strader/[name]: [name] ?[name] ?Weeks in Treatment: 26 ?Vital Signs ?Time Taken: 13:04 ?Temperature (??F): 97.6 ?Height (in): 64 ?Pulse (bpm): 61 ?Weight (lbs): 135 ?Respiratory Rate (breaths/min): 16 ?Body Mass Index (BMI): 23.2 ?Blood Pressure (mmHg): 166/50 ?Capillary Blood Glucose (mg/dl): 168 ?Reference Range: 80 - 120 mg / dl ?Electronic Signature(s) ?Signed: 12/20/2021 4:34:38 PM By: Deon Pilling RN, BSN ?Entered By: Deon Pilling on 12/20/2021 13:28:01 ?

## 2021-12-20 NOTE — Progress Notes (Signed)
[name], [name] ([id number]) ?Visit Report for 12/20/2021 ?Problem List Details ?Patient Name: Date of Service: ?[name], [name] 12/20/2021 12:30 PM ?Medical Record Number: [id number] ?Patient Account Number: [id number] ?Date of Birth/Sex: Treating RN: ?1941-01-17 (81 y.o. F) ?Primary Care Provider: [name] Other Clinician: ?Referring Provider: ?Treating Provider/[name]: [name] ?[name] ?Weeks in Treatment: 26 ?Active Problems ?ICD-10 ?Encounter ?Code Description Active Date MDM ?Diagnosis ?E11.621 Type 2 diabetes mellitus with foot ulcer 06/15/2021 No Yes ?T81.31XD Disruption of external operation (surgical) wound, not elsewhere classified, 06/15/2021 No Yes ?subsequent encounter ?L97.514 Non-pressure chronic ulcer of other part of right foot with necrosis of bone 09/20/2021 No Yes ?E11.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 06/15/2021 No Yes ?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, right ankle and foot 10/25/2021 No Yes ?Inactive Problems ?ICD-10 ?Code Description Active Date Inactive Date ?L97.428 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 06/15/2021 06/15/2021 ?L97.528 Non-pressure chronic ulcer of other part of left foot with other specified severity 06/15/2021 06/15/2021 ?Resolved Problems ?Electronic Signature(s) ?Signed: 12/20/2021 4:58:40 PM By: [name] MD FACS ?Entered By: [name] on 12/20/2021 16:58:40 ?-------------------------------------------------------------------------------- ?SuperBill Details ?Patient Name: ?Date of Service: ?[name], [name] 12/20/2021 ?Medical Record Number: [id number] ?Patient Account Number: [id number] ?Date of Birth/Sex: ?Treating RN: ?[birth year] (81 y.o. 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 ?N16.579 Other chronic osteomyelitis, right ankle and foot ?Facility Procedures ?CPT4 Code: 03833383 ?Description: G0277-(Facility Use Only) HBOT full body chamber, 91mn , ?Modifier: ?Quantity: 4 ?Physician Procedures ?: CPT4 Code Description Modifier [id number] PHYS HYPERBARIC OXYGEN THERAPY ICD-10 Diagnosis Description 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, 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

## 2021-12-20 NOTE — Progress Notes (Signed)
ISHA, SEEFELD ([id number]) ?Visit Report for 12/20/2021 ?HBO Details ?Patient Name: Date of Service: ?[name], [name] 12/20/2021 12:30 PM ?Medical Record Number: [id number] ?Patient Account Number: [id number] ?Date of Birth/Sex: Treating RN: ?June 13, 1941 (81 y.o. F) [name], [name] ?Primary Care Rik Wadel: [name] Other Clinician: ?Referring Abelino Tippin: ?Treating Allure Greaser/[name]: [name] ?[name] ?Weeks in Treatment: 26 ?HBO Treatment Course Details ?Treatment Course Number: 2 ?Ordering Rie Mcneil: [name] ?T Treatments Ordered: ?otal 30 HBO Treatment Start Date: 11/08/2021 ?HBO Indication: ?Diabetic Ulcer(s) of the Lower Extremity ?Standard/Conservative Wound Care tried and failed greater than or equal to ?30 days ?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 ?HBO Treatment Details ?Treatment Number: 30 ?Patient Type: Outpatient ?Chamber Type: Monoplace ?Chamber Serial #: [id number] ?Treatment Protocol: 2.0 ATA with 90 minutes oxygen, and no air breaks ?Treatment Details ?Compression Rate Down: 2.0 psi / minute ?De-Compression Rate Up: 2.0 psi / minute ?Air ?breaks ?and ?breathing Decompress Decompress ?Compress Tx Pressure ?Begins Reached periods Begins Ends ?(leave ?unused ?spaces ?blank) ?Chamber Pressure (ATA 1 2 ------2 1 ?) ?Clock Time (24 hr) 13:09 13:17 - - - - - - 14:47 14:55 ?Treatment Length: 106 (minutes) ?Treatment Segments: 4 ?Vital Signs ?Capillary Blood Glucose Reference Range: 80 - 120 mg / dl ?HBO Diabetic Blood Glucose Intervention Range: <131 mg/dl or >249 mg/dl ?Time Vitals Blood Respiratory Capillary Blood Glucose Pulse Action ?Type: ?Pulse: Temperature: ?Taken: ?Pressure: ?Rate: ?Glucose (mg/dl): ?Meter #: Oximetry (%) Taken: ?Pre 13:04 166/50 61 16 97.6 168 ?Post 15:01 148/41 45 16 97.9 103 ?Treatment Response ?Treatment Toleration: Well ?Treatment Completion Status: Treatment Completed without Adverse Event ?Additional Procedure Documentation ?Tissue  Sevierity: Limited to breakdown of skin ?Physician HBO Attestation: ?I certify that I supervised this HBO treatment in accordance with Medicare ?guidelines. A trained emergency response team is readily available per Yes ?hospital policies and procedures. ?Continue HBOT as ordered. Yes ?Electronic Signature(s) ?Signed: 12/20/2021 4:58:05 PM By: [name] MD FACS ?Previous Signature: 12/20/2021 4:34:38 PM Version By: Deon Pilling RN, BSN ?Entered By: [name] on 12/20/2021 16:58:04 ?-------------------------------------------------------------------------------- ?HBO Safety Checklist Details ?Patient Name: ?Date of Service: ?[name], [name] 12/20/2021 12:30 PM ?Medical Record Number: [id number] ?Patient Account Number: [id number] ?Date of Birth/Sex: ?Treating RN: ?May 21, 1941 (81 y.o. F) [name], [name] ?Primary Care Kaimana Neuzil: [name] ?Other Clinician: ?Referring Jontae Adebayo: ?Treating Tully Mcinturff/[name]: [name] ?[name] ?Weeks in Treatment: 26 ?HBO Safety Checklist Items ?Safety Checklist ?Consent Form Signed ?Patient voided / foley secured and emptied ?When did you last eato 0900 ?Last dose of injectable or oral agent 0900 ?Ostomy pouch emptied and vented if applicable ?NA ?All implantable devices assessed, documented and approved ?NA ?Intravenous access site secured and place ?NA ?Valuables secured ?Linens and cotton and cotton/polyester blend (less than 51% polyester) ?Personal oil-based products / skin lotions / body lotions removed ?NA ?Wigs or hairpieces removed ?Smoking or tobacco materials removed ?NA ?Books / newspapers / magazines / loose paper removed ?NA ?Cologne, aftershave, perfume and deodorant removed ?NA ?Jewelry removed (may wrap wedding band) ?Make-up removed ?NA ?Hair care products removed ?NA ?Battery operated devices (external) removed ?NA ?Heating patches and chemical warmers removed ?NA ?Titanium eyewear removed ?Nail polish cured greater than 10  hours ?Casting material cured greater than 10 hours ?NA ?Hearing aids removed ?NA ?Loose dentures or partials removed ?NA ?Prosthetics have been removed ?NA ?Patient demonstrates correct use of air break device (if applicable) ?Patient concerns have been addressed ?Patient grounding bracelet on and cord attached to chamber ?Specifics for Inpatients (complete in addition  to above) ?Medication sheet sent with patient ?Intravenous medications needed or due during therapy sent with patient ?Drainage tubes (e.g. nasogastric tube or chest tube secured and vented) ?Endotracheal or Tracheotomy tube secured ?Cuff deflated of air and inflated with saline ?Airway suctioned ?Electronic Signature(s) ?Signed: 12/20/2021 4:34:38 PM By: Deon Pilling RN, BSN ?Entered By: Deon Pilling on 12/20/2021 13:29:22 ?

## 2021-12-21 ENCOUNTER — Encounter (HOSPITAL_BASED_OUTPATIENT_CLINIC_OR_DEPARTMENT_OTHER): Payer: Medicare Other | Admitting: Internal Medicine

## 2021-12-21 ENCOUNTER — Encounter (HOSPITAL_BASED_OUTPATIENT_CLINIC_OR_DEPARTMENT_OTHER): Payer: Medicare Other | Admitting: General Surgery

## 2021-12-21 ENCOUNTER — Other Ambulatory Visit: Payer: Self-pay | Admitting: Cardiovascular Disease

## 2021-12-21 DIAGNOSIS — T8131XD Disruption of external operation (surgical) wound, not elsewhere classified, subsequent encounter: Secondary | ICD-10-CM | POA: Diagnosis not present

## 2021-12-21 DIAGNOSIS — L97512 Non-pressure chronic ulcer of other part of right foot with fat layer exposed: Secondary | ICD-10-CM | POA: Diagnosis not present

## 2021-12-21 DIAGNOSIS — E11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: Secondary | ICD-10-CM | POA: Diagnosis not present

## 2021-12-21 DIAGNOSIS — E11621 Type 2 diabetes mellitus with foot ulcer: Secondary | ICD-10-CM | POA: Diagnosis not present

## 2021-12-21 DIAGNOSIS — L97514 Non-pressure chronic ulcer of other part of right foot with necrosis of bone: Secondary | ICD-10-CM | POA: Diagnosis not present

## 2021-12-21 DIAGNOSIS — M86671 Other chronic osteomyelitis, right ankle and foot: Secondary | ICD-10-CM | POA: Diagnosis not present

## 2021-12-21 LAB — GLUCOSE, CAPILLARY
Glucose-Capillary: 169 mg/dL — ABNORMAL HIGH (ref 70–99)
Glucose-Capillary: 86 mg/dL (ref 70–99)

## 2021-12-21 NOTE — Progress Notes (Signed)
[name], [name] ([id number]) ?Visit Report for 12/21/2021 ?Problem List Details ?Patient Name: Date of Service: ?[name], [name] 12/21/2021 12:30 PM ?Medical Record Number: [id number] ?Patient Account Number: [id number] ?Date of Birth/Sex: Treating RN: ?01/12/1941 (81 y.o. 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 06/15/2021 No Yes ?B09.628 Other chronic osteomyelitis, right ankle and foot 10/25/2021 No Yes ?Inactive Problems ?ICD-10 ?Code Description Active Date Inactive Date ?L97.428 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 06/15/2021 06/15/2021 ?L97.528 Non-pressure chronic ulcer of other part of left foot with other specified severity 06/15/2021 06/15/2021 ?Resolved Problems ?Electronic Signature(s) ?Signed: 12/21/2021 5:24:14 PM By: [name] MD FACS ?Previous Signature: 12/21/2021 3:35:23 PM Version By: [name] EMT ?Entered By: [name] on 12/21/2021 17:24:14 ?-------------------------------------------------------------------------------- ?SuperBill Details ?Patient Name: ?Date of Service: ?[name], [name] 12/21/2021 ?Medical Record Number: [id number] ?Patient Account Number: [id number] ?Date of Birth/Sex: ?Treating RN: ?March 01, 1941 (81 y.o. 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 ?Y65.035 Other chronic osteomyelitis, right ankle and foot ?Facility Procedures ?CPT4 Code: 46568127 ?Description: G0277-(Facility Use Only) HBOT full body chamber, 83mn , ICD-10 Diagnosis Description 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 M86.671 Other  ?chronic osteomyelitis, right ankle and foot T81.31XD Disruption of external operation (surgical) wound, not elsewhere classified, subse ?Modifier: quent encounter ?Quantity: 4 ?Physician Procedures ?: CPT4 Code Description Modifier [id number] WC PHYS HYPERBARIC OXYGEN THERAPY ICD-10 Diagnosis Description 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

## 2021-12-21 NOTE — Progress Notes (Signed)
CATALINA, SALASAR ([id number]) ?Visit Report for 12/20/2021 ?Arrival Information Details ?Patient Name: Date of Service: ?[name], [name] 12/20/2021 3:00 PM ?Medical Record Number: [id number] ?Patient Account Number: [id number] ?Date of Birth/Sex: Treating RN: ?Oct 01, 1940 (81 y.o. [name], [name] ?Primary Care Tiant Peixoto: [name] Other Clinician: ?Referring Simisola Sandles: ?Treating Tierany Appleby/[name]: [name] ?[name] ?Weeks in Treatment: 26 ?Visit Information History Since Last Visit ?Added or deleted any medications: No ?Patient Arrived: [name] ?Any new allergies or adverse reactions: No ?Arrival Time: 15:18 ?Had a fall or experienced change in No ?Accompanied By: alone ?activities of daily living that may affect ?Transfer Assistance: None ?risk of falls: ?Patient Identification Verified: Yes ?Signs or symptoms of abuse/neglect since last visito No ?Secondary Verification Process Completed: Yes ?Hospitalized since last visit: No ?Patient Requires Transmission-Based Precautions: No ?Implantable device outside of the clinic excluding No ?Patient Has Alerts: Yes ?cellular tissue based products placed in the center ?Patient Alerts: Patient on Blood Thinner since last visit: ?Has Dressing in Place as Prescribed: Yes ?Pain Present Now: No ?Electronic Signature(s) ?Signed: 12/21/2021 5:30:36 PM By: [name] RN, BSN ?Entered By: [name] on 12/20/2021 15:19:12 ?-------------------------------------------------------------------------------- ?Encounter Discharge Information Details ?Patient Name: Date of Service: ?[name], [name] 12/20/2021 3:00 PM ?Medical Record Number: [id number] ?Patient Account Number: [id number] ?Date of Birth/Sex: Treating RN: ?Apr 02, 1941 (81 y.o. [name], [name] ?Primary Care Etai Copado: [name] Other Clinician: ?Referring Mimi Debellis: ?Treating Ayelet Gruenewald/[name]: [name] ?[name] ?Weeks in Treatment: 26 ?Encounter Discharge Information  Items Post Procedure Vitals ?Discharge Condition: Stable ?Temperature (F): 97.9 ?Ambulatory Status: [name] ?Pulse (bpm): 45 ?Discharge Destination: Home ?Respiratory Rate (breaths/min): 16 ?Transportation: Private Auto ?Blood Pressure (mmHg): 148/41 ?Accompanied By: alone ?Schedule Follow-up Appointment: Yes ?Clinical Summary of Care: Patient Declined ?Electronic Signature(s) ?Signed: 12/21/2021 5:30:36 PM By: [name] RN, BSN ?Entered By: [name] on 12/20/2021 17:47:48 ?-------------------------------------------------------------------------------- ?Lower Extremity Assessment Details ?Patient Name: ?Date of Service: ?[name], [name] 12/20/2021 3:00 PM ?Medical Record Number: [id number] ?Patient Account Number: [id number] ?Date of Birth/Sex: ?Treating RN: ?08-07-1941 (81 y.o. [name], [name] ?Primary Care Kem Parcher: [name] ?Other Clinician: ?Referring Braydon Kullman: ?Treating Patrici Minnis/[name]: [name] ?[name] ?Weeks in Treatment: 26 ?Edema Assessment ?Assessed: [Left: No] [Right: No] ?Edema: [Left: No] [Right: Yes] ?Calf ?Left: Right: ?Point of Measurement: From Medial Instep 31 cm 30 cm ?Ankle ?Left: Right: ?Point of Measurement: From Medial Instep 19 cm 19 cm ?Vascular Assessment ?Pulses: ?Dorsalis Pedis ?Palpable: [Left:Yes] [Right:Yes] ?Electronic Signature(s) ?Signed: 12/21/2021 5:30:36 PM By: [name] RN, BSN ?Entered By: [name] on 12/20/2021 15:28:50 ?-------------------------------------------------------------------------------- ?Multi Wound Chart Details ?Patient Name: ?Date of Service: ?[name], [name] 12/20/2021 3:00 PM ?Medical Record Number: [id number] ?Patient Account Number: [id number] ?Date of Birth/Sex: ?Treating RN: ?1941-08-14 (81 y.o. 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 -] [N/A:N/A N/A] ?Wound Location: [13:Surgical Injury] [15:Transmetatarsal Other Lesion] [N/A:N/A] ?Wounding Event: [13:Diabetic Wound/Ulcer of the Lower] [15:Diabetic Wound/Ulcer of the Lower] [N/A:N/A] ?Primary Etiology: [13:Extremity Open Surgical Wound] [15:Extremity N/A] [N/A:N/A] ?Secondary Etiology: [13:Coronary Artery Disease, Deep Vein Coronary Artery Disease, Deep Vein N/A] ?Comorbid History: [13:Thrombosis, Hypertension, Myocardial Thrombosis, Hypertension, Myocardial Infarction, Peripheral Arterial Disease, Infarction, Peripheral Arterial Disease, Type II Diabetes 05/11/2021] [15:Type II Diabetes 09/15/2021] [N/A:N/A] ?Date Acquired: [13:26] [  15:13] [N/A:N/A] ?Weeks of Treatment: [13:Open] [15:Open] [N/A:N/A] ?Wound Status: [13:No] [15:No] [N/A:N/A] ?Wound Recurrence: [13:0.1x0.1x0.1] [15:0.2x0.4x0.2] [N/A:N/A] ?Measurements L x W x D (cm) [13:0.008] [15:0.063] [N/A:N/A] ?A (cm?) : ?rea [13:0.001] [15:0.013] [N/A:N/A] ?Volume (cm?) : [13:100.00%] [15:84.00%] [N/A:N/A] ?% Reduction in A [13:rea: 100.00%] [15:89.00%] [N/A:N/A] ?% Reduction in Volume: [13:Grade 2] [15:Grade 3] [N/A:N/A] ?Classification: [13:Small] [15:Medium] [N/A:N/A] ?Exudate A mount: [13:Serosanguineous] [15:Serosanguineous] [N/A:N/A] ?Exudate Type: [13:red, brown] [15:red, brown] [N/A:N/A] ?Exudate Color: [13:Distinct, outline attached] [15:Thickened] [N/A:N/A] ?Wound Margin: [13:Large (67-100%)] [15:Large (67-100%)] [N/A:N/A] ?Granulation A mount: [13:Pink] [15:Pink] [N/A:N/A] ?Granulation Quality: [13:None Present (0%)] [15:Small (1-33%)] [N/A:N/A] ?Necrotic A mount: ?[13:Fat Layer (Subcutaneous Tissue): Yes Fat Layer (Subcutaneous Tissue): Yes N/A] ?Exposed  Structures: ?[13:Fascia: No Tendon: No Muscle: No Joint: No Bone: No Large (67-100%)] [15:Fascia: No Tendon: No Muscle: No Joint: No Bone: No Large (67-100%)] [N/A:N/A] ?Epithelialization: [13:Debridement - Selective/Open Wound Debridement - Selective/Open Wound N/A] ?Debridement: ?Pre-procedure Verification/Time Out 15:33 [15:15:33] [N/A:N/A] ?Taken: [13:Callus] [15:Callus] [N/A:N/A] ?Tissue Debrided: [13:Non-Viable Tissue] [15:Non-Viable Tissue] [N/A:N/A] ?Level: [13:0.25] [15:0.08] [N/A:N/A] ?Debridement A (sq cm): [13:rea Curette] [15:Curette] [N/A:N/A] ?Instrument: [13:Minimum] [15:Minimum] [N/A:N/A] ?Bleeding: [13:Pressure] [15:Pressure] [N/A:N/A] ?Hemostasis A chieved: [13:0] [15:0] [N/A:N/A] ?Procedural Pain: [13:0] [15:0] [N/A:N/A] ?Post Procedural Pain: [13:Procedure was tolerated well] [15:Procedure was tolerated well] [N/A:N/A] ?Debridement Treatment Response: [13:0.1x0.1x0.1] [15:0.2x0.4x0.2] [N/A:N/A] ?Post Debridement Measurements L x ?W x D (cm) [13:0.001] [15:0.013] [N/A:N/A] ?Post Debridement Volume: (cm?) [13:Debridement] [15:Debridement] [N/A:N/A] ?Treatment Notes ?Electronic Signature(s) ?Signed: 12/20/2021 3:54:42 PM By: [name] MD FACS ?Entered By: [name] on 12/20/2021 15:54:42 ?-------------------------------------------------------------------------------- ?Multi-Disciplinary Care Plan Details ?Patient Name: ?Date of Service: ?[name], [name] 12/20/2021 3:00 PM ?Medical Record Number: [id number] ?Patient Account Number: [id number] ?Date of Birth/Sex: ?Treating RN: ?[birth year] (81 y.o. [name], [name] ?Primary Care Jenine Krisher: [name] ?Other Clinician: ?Referring Fredia Chittenden: ?Treating Hendrix Console/[name]: [name] ?[name] ?Weeks in Treatment: 26 ?Multidisciplinary Care Plan reviewed with physician ?Active Inactive ?Nutrition ?Nursing Diagnoses: ?Impaired glucose control: actual or potential ?Potential for alteratiion in Nutrition/Potential for  imbalanced nutrition ?Goals: ?Patient/caregiver agrees to and verbalizes understanding of need to use nutritional supplements and/or vitamins as prescribed ?Date Initiated: 06/15/2021 ?Date Inactivated: 09/20/2021 ?Target Resolution Dat

## 2021-12-21 NOTE — Progress Notes (Addendum)
[name], [name] ([id number]) ?Visit Report for 12/21/2021 ?HBO Details ?Patient Name: Date of Service: ?[name], [name] 12/21/2021 12:30 PM ?Medical Record Number: [id number] ?Patient Account Number: [id number] ?Date of Birth/Sex: Treating RN: ?Oct 11, 1940 (81 y.o. 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 ?Notes: Extended 2/29/2023 ?Standard/Conservative Wound Care tried and failed greater than or equal to ?30 days ?HBO Treatment Details ?Treatment Number: 40 ?Patient Type: Outpatient ?Chamber Type: Monoplace ?Chamber Serial #: [id number] ?Treatment Protocol: 2.0 ATA with 90 minutes oxygen, and no air breaks ?Treatment Details ?Compression Rate Down: 2.0 psi / minute ?De-Compression Rate Up: 2.0 psi / minute ?Air ?breaks ?and ?breathing Decompress Decompress ?Compress Tx Pressure ?Begins Reached periods Begins Ends ?(leave ?unused ?spaces ?blank) ?Chamber Pressure (ATA 1 2 ------2 1 ?) ?Clock Time (24 hr) 13:03 13:12 - - - - - - 14:42 14:50 ?Treatment Length: 107 (minutes) ?Treatment Segments: 4 ?Vital Signs ?Capillary Blood Glucose Reference Range: 80 - 120 mg / dl ?HBO Diabetic Blood Glucose Intervention Range: <131 mg/dl or >249 mg/dl ?Time Capillary Blood Glucose Pulse ?Blood Respiratory Action ?Type: Vitals ?Pulse: ?Temperature: Glucose Oximetry ?Pressure: ?Rate: ?Meter #: Taken: ?Taken: ?(mg/dl): (%) ?Pre 12:57 152/54 50 16 97.6 169 100 Informed physician of manual Pulse and BP. ?Informed physician of manual Pulse. Given ?Post 14:57 182/50 42 16 86 ?Glucerna ?Treatment Response ?Treatment Toleration: Well ?Treatment Completion Status:  Treatment Completed without Adverse Event ?Treatment Notes ?The patient did well today and had no problems with her treatment. CBG post treatment 86. She was given 8 oz Glucerna and waited 34mn. Dr. CCeline [name]?informed of heart rate and CBG. ?Additional Procedure Documentation ?Tissue Sevierity: Fat layer exposed ?Physician HBO Attestation: ?I certify that I supervised this HBO treatment in accordance with Medicare ?guidelines. A trained emergency response team is readily available per Yes ?hospital policies and procedures. ?Continue HBOT as ordered. Yes ?Electronic Signature([name]) ?Signed: 12/21/2021 5:23:54 PM By: [name] ?Previous Signature: 12/21/2021 3:34:28 PM Version By: [name] ?Previous Signature: 12/21/2021 1:33:19 PM Version By: [name] ?, , ?Previous Signature: 12/21/2021 1:32:39 PM Version By: [name] ?, , ?Previous Signature: 12/21/2021 1:32:20 PM Version By: [name] ?, , ?Entered By: [name] Maudlinon 12/21/2021 17:23:53 ?-------------------------------------------------------------------------------- ?HBO Safety Checklist Details ?Patient Name: ?Date of Service: ?[name], [name] 12/21/2021 12:30 PM ?Medical Record Number: [id number]?Patient Account Number: [id number]?Date of Birth/Sex: ?Treating RN: ?6May 24, 1942((81y.o. FBenjamine [name] [name] ?Primary Care Cash Duce: TSela [name]?Other Clinician: SDonavan [name]?Referring Keithan Dileonardo: ?Treating Rissa Turley/[name]: [name]?TSela [name]?Weeks in Treatment: 27 ?HBO Safety Checklist Items ?Safety Checklist ?Consent Form Signed ?Patient voided / foley secured and emptied ?When did you last eato 0830 ?Last dose of injectable or oral agent 0845 ?Ostomy pouch emptied and vented if applicable ?NA ?All implantable devices assessed, documented and approved ?NA ?Intravenous access site secured and place ?NA ?Valuables secured ?Linens and cotton and  cotton/polyester blend (less than 51% polyester) ?Personal oil-based products / skin lotions / body lotions removed ?Wigs or hairpieces removed Patient removed. ?Smoking or tobacco materials removed ?NA ?Books / newspapers / magazines / loose paper removed ?Cologne, aftershave, perfume and deodorant removed ?Jewelry removed (may  wrap wedding band) ?Make-up removed ?Hair care products removed ?Battery operated devices (external) removed ?Heating patches and chemical warmers removed ?Titanium eyewear removed ?Nail polish cured greater than 10 hours ?Casting material cured greater than 10 hours ?NA ?Hearing aids removed ?NA ?Loose dentures or partials removed ?NA ?Prosthetics have been removed ?NA ?Patient demonstrates correct use of air break device (if applicable) ?Patient concerns have been addressed ?Patient grounding bracelet on and cord attached to chamber ?Specifics for Inpatients (complete in addition to above) ?Medication sheet sent with patient ?NA ?Intravenous medications needed or due during therapy sent with patient ?NA ?Drainage tubes (e.g. nasogastric tube or chest tube secured and vented) ?NA ?Endotracheal or Tracheotomy tube secured ?NA ?Cuff deflated of air and inflated with saline ?NA ?Airway suctioned ?NA ?Notes ?Paper version used prior to treatment. ?Electronic Signature([name]) ?Signed: 12/21/2021 1:31:51 PM By: [name] CHT EMT [name] ?, , ?Signed: 12/21/2021 1:31:51 PM By: [name] CHT EMT [name] ?, , ?Entered By: [name] on 12/21/2021 13:31:51 ?

## 2021-12-21 NOTE — Progress Notes (Addendum)
[name], [name] ([id number]) ?Visit Report for 12/21/2021 ?Arrival Information Details ?Patient Name: Date of Service: ?[name], [name] 12/21/2021 12:30 PM ?Medical Record Number: [id number] ?Patient Account Number: [id number] ?Date of Birth/Sex: Treating RN: ?03-03-1941 (81 y.o. [name], [name] ?Primary Care [name]: [name] ?Other Clinician: Donavan [name] ?Referring [name]: ?Treating [name]/[name]: [name] ?[name] ?Weeks in Treatment: 27 ?Visit Information History Since Last Visit ?All ordered tests and consults were completed: Yes ?Patient Arrived: [name] ?Added or deleted any medications: No ?Arrival Time: 12:41 ?Any new allergies or adverse reactions: No ?Accompanied By: self ?Had a fall or experienced change in No ?Transfer Assistance: None ?activities of daily living that may affect ?Patient Identification Verified: Yes ?risk of falls: ?Secondary Verification Process Completed: Yes ?Signs or symptoms of abuse/neglect since last visito No ?Patient Requires Transmission-Based Precautions: No ?Hospitalized since last visit: No ?Patient Has Alerts: Yes ?Implantable device outside of the clinic excluding No ?Patient Alerts: Patient on Blood Thinner cellular tissue based products placed in the center ?since last visit: ?Pain Present Now: No ?Electronic Signature(s) ?Signed: 12/21/2021 1:29:23 PM By: [name] CHT EMT BS ?, , ?Entered By: [name] on 12/21/2021 13:29:23 ?-------------------------------------------------------------------------------- ?Encounter Discharge Information Details ?Patient Name: Date of Service: ?[name], [name] 12/21/2021 12:30 PM ?Medical Record Number: [id number] ?Patient Account Number: [id number] ?Date of Birth/Sex: Treating RN: ?01-20-1941 (81 y.o. [name], [name] ?Primary Care [name]: [name] ?Other Clinician: Valeria [name] ?Referring [name]: ?Treating [name]/[name]: [name] ?[name] ?Weeks in Treatment: 27 ?Encounter Discharge Information Items ?Discharge Condition: Stable ?Ambulatory Status: [name] ?Discharge Destination: Home ?Transportation: Private Auto ?Accompanied By: None ?Schedule Follow-up Appointment: Yes ?Clinical Summary of Care: ?Electronic Signature(s) ?Signed: 12/21/2021 3:35:59 PM By: [name] EMT ?Entered By: [name] on 12/21/2021 15:35:59 ?-------------------------------------------------------------------------------- ?Vitals Details ?Patient Name: ?Date of Service: ?[name], [name] 12/21/2021 12:30 PM ?Medical Record Number: [id number] ?Patient Account Number: [id number] ?Date of Birth/Sex: ?Treating RN: ?November 05, 1940 (81 y.o. [name], [name] ?Primary Care [name]: [name] ?Other Clinician: Donavan [name] ?Referring [name]: ?Treating [name]/[name]: [name] ?[name] ?Weeks in Treatment: 27 ?Vital Signs ?Time Taken: 12:57 ?Temperature (??F): 97.6 ?Height (in): 64 ?Pulse (bpm): 50 ?Weight (lbs): 135 ?Respiratory Rate (breaths/min): 16 ?Body Mass Index (BMI): 23.2 ?Blood Pressure (mmHg): 152/54 ?Capillary Blood Glucose (mg/dl): 169 ?Reference Range: 80 - 120 mg / dl ?Airway ?Pulse Oximetry (%): 100 ?Electronic Signature(s) ?Signed: 12/21/2021 1:30:23 PM By: [name] CHT EMT BS ?, , ?Previous Signature: 12/21/2021 1:30:03 PM Version By: [name] CHT EMT BS ?, , ?Entered By: [name] on 12/21/2021 13:30:23 ?

## 2021-12-21 NOTE — Progress Notes (Signed)
TALLULAH, HOSMAN ([id number]) ?Visit Report for 12/20/2021 ?Chief Complaint Document Details ?Patient Name: Date of Service: ?[name], [name] 12/20/2021 3:00 PM ?Medical Record Number: [id number] ?Patient Account Number: [id number] ?Date of Birth/Sex: Treating RN: ?10-31-1940 (81 y.o. F) ?Primary Care Provider: [name] Other Clinician: ?Referring Provider: ?Treating Provider/[name]: [name] ?[name] ?Weeks in Treatment: 26 ?Information Obtained from: Patient ?Chief Complaint ?07/13/2019; patient is here for review wounds on the toes of her right foot and the left fourth toe ?08/04/2020; patient is here for review of wounds at the anterior part of her right first met head/TMA site and another area on the right medial calf at a vein ?harvest site. ?Electronic Signature(s) ?Signed: 12/20/2021 3:55:03 PM By: [name] MD FACS ?Entered By: [name] on 12/20/2021 15:55:02 ?-------------------------------------------------------------------------------- ?Debridement Details ?Patient Name: Date of Service: ?[name], [name] 12/20/2021 3:00 PM ?Medical Record Number: [id number] ?Patient Account Number: [id number] ?Date of Birth/Sex: Treating RN: ?1941/05/24 (81 y.o. 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 ?Performed By: Physician [name], MD ?Debridement Type: Debridement ?Severity of Tissue Pre Debridement: Fat layer exposed ?Level of Consciousness (Pre-procedure): Awake and Alert ?Pre-procedure Verification/Time Out Yes - 15:33 ?Taken: ?Start Time: 15:33 ?T Area Debrided (L x W): ?otal 0.2 (cm) x 0.4 (cm) = 0.08 (cm?) ?Tissue and other material debrided: Non-Viable, Callus ?Level: Non-Viable Tissue ?Debridement Description: Selective/Open  Wound ?Instrument: Curette ?Bleeding: Minimum ?Hemostasis Achieved: Pressure ?End Time: 15:40 ?Procedural Pain: 0 ?Post Procedural Pain: 0 ?Response to Treatment: Procedure was tolerated well ?Level of Consciousness (Post- Awake and Alert ?procedure): ?Post Debridement Measurements of Total Wound ?Length: (cm) 0.2 ?Width: (cm) 0.4 ?Depth: (cm) 0.2 ?Volume: (cm?) 0.013 ?Character of Wound/Ulcer Post Debridement: Improved ?Severity of Tissue Post Debridement: Fat layer exposed ?Post Procedure Diagnosis ?Same as Pre-procedure ?Electronic Signature(s) ?Signed: 12/20/2021 4:57:02 PM By: [name] MD FACS ?Signed: 12/21/2021 5:30:36 PM By: [name] RN, BSN ?Entered By: [name] on 12/20/2021 15:41:18 ?-------------------------------------------------------------------------------- ?Debridement Details ?Patient Name: ?Date of Service: ?[name], [name] 12/20/2021 3:00 PM ?Medical Record Number: [id number] ?Patient Account Number: [id number] ?Date of Birth/Sex: ?Treating RN: ?Aug 01, 1941 (81 y.o. [name], [name] ?Primary Care Provider: [name] ?Other Clinician: ?Referring Provider: ?Treating Provider/[name]: [name] ?[name] ?Weeks in Treatment: 26 ?Debridement Performed for Assessment: Wound #13 Left Amputation Site - Transmetatarsal ?Performed By: Physician [name], MD ?Debridement Type: Debridement ?Severity of Tissue Pre Debridement: Fat layer exposed ?Level of Consciousness (Pre-procedure): Awake and Alert ?Pre-procedure Verification/Time Out Yes - 15:33 ?Taken: ?Start Time: 15:40 ?T Area Debrided (L x W): ?otal 0.5 (cm) x 0.5 (cm) = 0.25 (cm?) ?Tissue and other material debrided: Non-Viable, Callus ?Level: Non-Viable Tissue ?Debridement Description: Selective/Open Wound ?Instrument: Curette ?Bleeding: Minimum ?Hemostasis Achieved: Pressure ?End Time: 15:41 ?Procedural Pain: 0 ?Post Procedural Pain: 0 ?Response to Treatment: Procedure was tolerated well ?Level  of Consciousness (Post- Awake and Alert ?procedure): ?Post Debridement Measurements of Total Wound ?Length: (cm) 0.1 ?Width: (cm) 0.1 ?Depth: (cm) 0.1 ?Volume: (cm?) 0.001 ?Character of Wound/Ulcer Post Debridement: Improved ?Severity of Tissue Post Debridement: Fat layer exposed ?Post Procedure Diagnosis ?Same as Pre-procedure ?Electronic Signature(s) ?Signed: 12/20/2021 4:57:02 PM By: [name] MD FACS ?Signed: 12/21/2021 5:30:36 PM By: [name] RN, BSN ?Entered By: [name] on 12/20/2021 15:41:52 ?-------------------------------------------------------------------------------- ?HPI Details ?Patient Name: Date of Service: ?[name], PO LLY  H. 12/20/2021 3:00 PM ?Medical Record Number: [id number] ?Patient Account Number: [id number] ?Date of Birth/Sex: Treating RN: ?Dec 01, 1940 (81 y.o. F) ?Primary Care Provider: [name] Other Clinician: ?Referring Provider: ?Treating Provider/[name]: [name] ?[name] ?Weeks in Treatment: 26 ?History of Present Illness ?HPI Description: ADMISSION ?07/13/2019 ?Patient is a 81 year old type II diabetic. She has known PAD. She has been followed by Dr. Jacqualyn Posey of podiatry for blistering areas on her toes dating back to ?04/30/2019 which at this was the left fourth toe. By 8/11 she had wounds on the right and left second toes. She underwent arterial studies by Dr. Alvester Chou on 7/31 ?that showed ABIs on the right of 0.60 TBI of 0.26 on the left ABI of 0.56 and a TBI of 0.25. By 9/10 she had ischemic-looking wounds per Dr. Earleen Newport on the ?right first, left first second and third. She has been using Medihoney and then mupirocin more recently simply Betadine. ?The patient underwent angiography by Dr. Gwenlyn Found on 9/21. On the right this showed 90 to 95% calcified distal right common femoral artery stenosis and a 95% ?focal mid SFA stenosis followed by an 80% segmental stenosis. Noted that there was 1 vessel runoff in the foot via the peroneal. ?On the left  there was an 80% left SFA, 70% mid left SFA. There was a short segment calcified CTO distal less than SFA and above-knee popliteal artery ?reconstituting with two-vessel runoff. 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 It was felt that she had bilateral SFA disease as well as tibial vessel disease. An ?attempt was made to revascularize the left SFA but they were unable to cross because of the highly calcified nature of the lesion. 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 She has an area on the medial left fourth toe with raised horned callus on top of this. I am not certain what this represents. With regards to pain she has ?about a 2-1/2 I will claudication tolerance in the grocery store. She has some pain in night which is relieved by putting her feet down over the side of the bed. ?She is wearing Nitro-Dur patches on the top of her right foot. ?Past medical history includes type 2 diabetes with secondary PAD, neoplasm of the skin, diabetic retinopathy, carotid artery stenosis, hyperlipidemia ?hypertension. ?11/2; the last time the patient was here I spoke to Dr. Gwenlyn Found about revascularization options on the right. As I understand things currently Dr. Gwenlyn Found spoke with ?Dr. Trula Slade and ultimately the patient was taken to surgery on 10/27. She had a right iliofemoral endarterectomy with a bovine patch angioplasty. I think the ?plan now is for her to have a staged intervention on the right SFA by Dr. Alvester Chou. Per the patient's understanding Dr. Gwenlyn Found and Dr. Jacqualyn Posey are waiting to see ?when the gangrenous toes on the right foot can be amputated. ?The patient states her pain is a lot better and she is grateful for that. 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 Small area ?on the left fourth toe. She is using Betadine to the mummified areas on the right  and Medihoney on the left ?08/27/2019; the last time I saw this patient I discharged her from the clinic. She had been revascularized by Dr. Trula Slade and she had a right iliofemoral ?endarterectomy with a

## 2021-12-22 ENCOUNTER — Encounter (HOSPITAL_BASED_OUTPATIENT_CLINIC_OR_DEPARTMENT_OTHER): Payer: Medicare Other | Admitting: General Surgery

## 2021-12-22 ENCOUNTER — Encounter (HOSPITAL_BASED_OUTPATIENT_CLINIC_OR_DEPARTMENT_OTHER): Payer: Medicare Other | Admitting: Internal Medicine

## 2021-12-22 DIAGNOSIS — D638 Anemia in other chronic diseases classified elsewhere: Secondary | ICD-10-CM | POA: Diagnosis not present

## 2021-12-22 DIAGNOSIS — Z4781 Encounter for orthopedic aftercare following surgical amputation: Secondary | ICD-10-CM | POA: Diagnosis not present

## 2021-12-22 DIAGNOSIS — E11621 Type 2 diabetes mellitus with foot ulcer: Secondary | ICD-10-CM | POA: Diagnosis not present

## 2021-12-22 DIAGNOSIS — Z89412 Acquired absence of left great toe: Secondary | ICD-10-CM | POA: Diagnosis not present

## 2021-12-22 DIAGNOSIS — M86671 Other chronic osteomyelitis, right ankle and foot: Secondary | ICD-10-CM | POA: Diagnosis not present

## 2021-12-22 DIAGNOSIS — L97512 Non-pressure chronic ulcer of other part of right foot with fat layer exposed: Secondary | ICD-10-CM | POA: Diagnosis not present

## 2021-12-22 DIAGNOSIS — I70202 Unspecified atherosclerosis of native arteries of extremities, left leg: Secondary | ICD-10-CM | POA: Diagnosis not present

## 2021-12-22 DIAGNOSIS — E11628 Type 2 diabetes mellitus with other skin complications: Secondary | ICD-10-CM | POA: Diagnosis not present

## 2021-12-22 DIAGNOSIS — E11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: Secondary | ICD-10-CM | POA: Diagnosis not present

## 2021-12-22 DIAGNOSIS — T8131XD Disruption of external operation (surgical) wound, not elsewhere classified, subsequent encounter: Secondary | ICD-10-CM | POA: Diagnosis not present

## 2021-12-22 DIAGNOSIS — L97514 Non-pressure chronic ulcer of other part of right foot with necrosis of bone: Secondary | ICD-10-CM | POA: Diagnosis not present

## 2021-12-22 DIAGNOSIS — I10 Essential (primary) hypertension: Secondary | ICD-10-CM | POA: Diagnosis not present

## 2021-12-22 LAB — GLUCOSE, CAPILLARY
Glucose-Capillary: 196 mg/dL — ABNORMAL HIGH (ref 70–99)
Glucose-Capillary: 156 mg/dL — ABNORMAL HIGH (ref 70–99)

## 2021-12-22 NOTE — Progress Notes (Signed)
FALECIA, VANNATTER ([id number]) ?Visit Report for 12/22/2021 ?Arrival Information Details ?Patient Name: Date of Service: ?[name], [name] 12/22/2021 12:30 PM ?Medical Record Number: [id number] ?Patient Account Number: [id number] ?Date of Birth/Sex: Treating RN: ?[birth year] (81 y.o. [name], [name] ?Primary Care Romey Mathieson: [name] Other Clinician: ?Referring Trivia Heffelfinger: ?Treating Reza Crymes/[name]: [name] ?[name] ?Weeks in Treatment: 27 ?Visit Information History Since Last Visit ?Added or deleted any medications: No ?Patient Arrived: Ambulatory ?Any new allergies or adverse reactions: No ?Arrival Time: 10:04 ?Had a fall or experienced change in No ?Accompanied By: alone ?activities of daily living that may affect ?Transfer Assistance: None ?risk of falls: ?Patient Identification Verified: Yes ?Signs or symptoms of abuse/neglect since last visito No ?Secondary Verification Process Completed: Yes ?Hospitalized since last visit: No ?Patient Requires Transmission-Based Precautions: No ?Implantable device outside of the clinic excluding No ?Patient Has Alerts: Yes ?cellular tissue based products placed in the center ?Patient Alerts: Patient on Blood Thinner since last visit: ?Has Dressing in Place as Prescribed: Yes ?Pain Present Now: No ?Electronic Signature(s) ?Signed: 12/22/2021 4:27:25 PM By: [name] RN, BSN ?Entered By: [name] on 12/22/2021 16:10:01 ?-------------------------------------------------------------------------------- ?Encounter Discharge Information Details ?Patient Name: Date of Service: ?[name], [name] 12/22/2021 12:30 PM ?Medical Record Number: [id number] ?Patient Account Number: [id number] ?Date of Birth/Sex: Treating RN: ?1941/05/13 (81 y.o. [name], [name] ?Primary Care Davion Flannery: [name] Other Clinician: ?Referring Ming Mcmannis: ?Treating Alhaji Mcneal/[name]: [name] ?[name] ?Weeks in Treatment: 27 ?Encounter Discharge  Information Items ?Discharge Condition: Stable ?Ambulatory Status: Ambulatory ?Discharge Destination: Home ?Transportation: Private Auto ?Accompanied By: alone ?Schedule Follow-up Appointment: Yes ?Clinical Summary of Care: Patient Declined ?Electronic Signature(s) ?Signed: 12/22/2021 4:27:25 PM By: [name] RN, BSN ?Entered By: [name] on 12/22/2021 16:14:35 ?-------------------------------------------------------------------------------- ?Vitals Details ?Patient Name: ?Date of Service: ?[name], [name] 12/22/2021 12:30 PM ?Medical Record Number: [id number] ?Patient Account Number: [id number] ?Date of Birth/Sex: ?Treating RN: ?1941-03-29 (81 y.o. [name], [name] ?Primary Care Florinda Taflinger: [name] ?Other Clinician: ?Referring Maryn Freelove: ?Treating Corbitt Cloke/[name]: [name] ?[name] ?Weeks in Treatment: 27 ?Vital Signs ?Time Taken: 10:04 ?Temperature (??F): 98.0 ?Height (in): 64 ?Pulse (bpm): 44 ?Weight (lbs): 135 ?Respiratory Rate (breaths/min): 16 ?Body Mass Index (BMI): 23.2 ?Blood Pressure (mmHg): 176/48 ?Capillary Blood Glucose (mg/dl): 196 ?Reference Range: 80 - 120 mg / dl ?Electronic Signature(s) ?Signed: 12/22/2021 4:27:25 PM By: [name] RN, BSN ?Entered By: [name] on 12/22/2021 16:10:44 ?

## 2021-12-25 ENCOUNTER — Encounter (HOSPITAL_BASED_OUTPATIENT_CLINIC_OR_DEPARTMENT_OTHER): Payer: Medicare Other | Admitting: Internal Medicine

## 2021-12-25 ENCOUNTER — Encounter (HOSPITAL_BASED_OUTPATIENT_CLINIC_OR_DEPARTMENT_OTHER): Payer: Medicare Other | Attending: Internal Medicine | Admitting: Internal Medicine

## 2021-12-25 DIAGNOSIS — X58XXXD Exposure to other specified factors, subsequent encounter: Secondary | ICD-10-CM | POA: Insufficient documentation

## 2021-12-25 DIAGNOSIS — M86671 Other chronic osteomyelitis, right ankle and foot: Secondary | ICD-10-CM | POA: Insufficient documentation

## 2021-12-25 DIAGNOSIS — E11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: Secondary | ICD-10-CM | POA: Diagnosis not present

## 2021-12-25 DIAGNOSIS — I70202 Unspecified atherosclerosis of native arteries of extremities, left leg: Secondary | ICD-10-CM | POA: Diagnosis not present

## 2021-12-25 DIAGNOSIS — Z4781 Encounter for orthopedic aftercare following surgical amputation: Secondary | ICD-10-CM | POA: Diagnosis not present

## 2021-12-25 DIAGNOSIS — T8131XD Disruption of external operation (surgical) wound, not elsewhere classified, subsequent encounter: Secondary | ICD-10-CM | POA: Insufficient documentation

## 2021-12-25 DIAGNOSIS — E11628 Type 2 diabetes mellitus with other skin complications: Secondary | ICD-10-CM | POA: Diagnosis not present

## 2021-12-25 DIAGNOSIS — D638 Anemia in other chronic diseases classified elsewhere: Secondary | ICD-10-CM | POA: Diagnosis not present

## 2021-12-25 DIAGNOSIS — L97514 Non-pressure chronic ulcer of other part of right foot with necrosis of bone: Secondary | ICD-10-CM | POA: Diagnosis not present

## 2021-12-25 DIAGNOSIS — E11621 Type 2 diabetes mellitus with foot ulcer: Secondary | ICD-10-CM | POA: Diagnosis not present

## 2021-12-25 DIAGNOSIS — I10 Essential (primary) hypertension: Secondary | ICD-10-CM | POA: Diagnosis not present

## 2021-12-25 DIAGNOSIS — Z89412 Acquired absence of left great toe: Secondary | ICD-10-CM | POA: Diagnosis not present

## 2021-12-25 LAB — GLUCOSE, CAPILLARY
Glucose-Capillary: 191 mg/dL — ABNORMAL HIGH (ref 70–99)
Glucose-Capillary: 128 mg/dL — ABNORMAL HIGH (ref 70–99)

## 2021-12-25 NOTE — Progress Notes (Signed)
[name], [name] ([id number]) ?Visit Report for 12/22/2021 ?SuperBill Details ?Patient Name: Date of Service: ?[name], [name] 12/22/2021 ?Medical Record Number: [id number] ?Patient Account Number: [id number] ?Date of Birth/Sex: Treating RN: ?11-17-1940 (81 y.o. 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, right ankle and foot ?Facility Procedures ?CPT4 Code Description Modifier Quantity ?47654650 G0277-(Facility Use Only) HBOT full body chamber, 54mn ?, 4 ?Physician Procedures ?Quantity ?CPT4 Code Description Modifier ?[id number] WC PHYS HYPERBARIC OXYGEN THERAPY 1 ?ICD-10 Diagnosis Description ?MZ00.174Other chronic osteomyelitis, right ankle and foot ?E11.621 Type 2 diabetes mellitus with foot ulcer ?L97.514 Non-pressure chronic ulcer of other part of right foot with necrosis of bone ?Electronic Signature(s) ?Signed: 12/25/2021 7:51:57 AM By: CFredirick MaudlinMD FACS ?Previous Signature: 12/22/2021 4:27:25 PM Version By: LLevan HurstRN, BSN ?Entered By: CFredirick Maudlinon 12/25/2021 07:51:56 ?

## 2021-12-25 NOTE — Progress Notes (Signed)
[name], [name] ([id number]) ?Visit Report for 12/22/2021 ?HBO Details ?Patient Name: Date of Service: ?[name], [name] 12/22/2021 12:30 PM ?Medical Record Number: [id number] ?Patient Account Number: [id number] ?Date of Birth/Sex: Treating RN: ?[birth year] (81 y.o. [name], [name] ?Primary Care [name]: [name] Other Clinician: ?Referring [name]: ?Treating [name]/[name]: [name] ?[name] ?Weeks in Treatment: 27 ?HBO Treatment Course Details ?Treatment Course Number: 2 ?Ordering Dameshia Seybold: [name] ?T Treatments Ordered: ?otal 50 HBO Treatment Start Date: 11/08/2021 ?HBO Indication: ?Diabetic Ulcer(s) of the Lower Extremity ?Notes: Extended 2/29/2023 ?Standard/Conservative Wound Care tried and failed greater than or equal to ?30 days ?HBO Treatment Details ?Treatment Number: 32 ?Patient Type: Outpatient ?Chamber Type: Monoplace ?Chamber Serial #: [id number] ?Treatment Protocol: 2.0 ATA with 90 minutes oxygen, and no air breaks ?Treatment Details ?Compression Rate Down: 2.0 psi / minute ?De-Compression Rate Up: 2.0 psi / minute ?Air ?breaks ?and ?breathing Decompress Decompress ?Compress Tx Pressure ?Begins Reached periods Begins Ends ?(leave ?unused ?spaces ?blank) ?Chamber Pressure (ATA 1 2 ------2 1 ?) ?Clock Time (24 hr) 10:09 10:17 - - - - - - 11:47 11:55 ?Treatment Length: 106 (minutes) ?Treatment Segments: 4 ?Vital Signs ?Capillary Blood Glucose Reference Range: 80 - 120 mg / dl ?HBO Diabetic Blood Glucose Intervention Range: <131 mg/dl or >249 mg/dl ?Time Vitals Blood Respiratory Capillary Blood Glucose Pulse Action ?Type: ?Pulse: Temperature: ?Taken: ?Pressure: ?Rate: ?Glucose (mg/dl): ?Meter #: Oximetry (%) Taken: ?Pre 10:04 176/48 44 16 98 196 ?Post 11:55 168/58 50 18 97.7 154 ?Treatment Response ?Treatment Completion Status: Treatment Completed without Adverse Event ?Additional Procedure Documentation ?Tissue Sevierity: Fat layer exposed ?Physician HBO Attestation: ?I  certify that I supervised this HBO treatment in accordance with Medicare ?guidelines. A trained emergency response team is readily available per Yes ?hospital policies and procedures. ?Continue HBOT as ordered. Yes ?Electronic Signature(s) ?Signed: 12/25/2021 7:51:25 AM By: [name] MD FACS ?Previous Signature: 12/22/2021 4:27:25 PM Version By: [name] RN, BSN ?Entered By: [name] on 12/25/2021 07:51:25 ?-------------------------------------------------------------------------------- ?HBO Safety Checklist Details ?Patient Name: ?Date of Service: ?[name], [name] 12/22/2021 12:30 PM ?Medical Record Number: [id number] ?Patient Account Number: [id number] ?Date of Birth/Sex: ?Treating RN: ?1940/10/25 (81 y.o. [name], [name] ?Primary Care [name]: [name] ?Other Clinician: ?Referring [name]: ?Treating [name]/[name]: [name] ?[name] ?Weeks in Treatment: 27 ?HBO Safety Checklist Items ?Safety Checklist ?Consent Form Signed ?Patient voided / foley secured and emptied ?When did you last eato 0830 ?Last dose of injectable or oral agent 0900 ?Ostomy pouch emptied and vented if applicable ?NA ?All implantable devices assessed, documented and approved ?NA ?Intravenous access site secured and place ?NA ?Valuables secured ?Linens and cotton and cotton/polyester blend (less than 51% polyester) ?Personal oil-based products / skin lotions / body lotions removed ?Wigs or hairpieces removed ?Smoking or tobacco materials removed ?Books / newspapers / magazines / loose paper removed ?Cologne, aftershave, perfume and deodorant removed ?Jewelry removed (may wrap wedding band) ?Make-up removed ?Hair care products removed ?Battery operated devices (external) removed ?Heating patches and chemical warmers removed ?Titanium eyewear removed ?Nail polish cured greater than 10 hours ?Casting material cured greater than 10 hours ?NA ?Hearing aids removed ?NA ?Loose dentures or partials  removed ?Prosthetics have been removed ?NA ?Patient demonstrates correct use of air break device (if applicable) ?Patient concerns have been addressed ?Patient grounding bracelet on and cord attached to chamber ?Specifics for Inpatients (complete in addition to above) ?Medication sheet sent with patient ?NA ?Intravenous medications needed or due during therapy sent with patient ?  NA ?Drainage tubes (e.g. nasogastric tube or chest tube secured and vented) ?NA ?Endotracheal or Tracheotomy tube secured ?NA ?Cuff deflated of air and inflated with saline ?NA ?Airway suctioned ?NA ?Electronic Signature(s) ?Signed: 12/22/2021 4:27:25 PM By: [name] RN, BSN ?Entered By: [name] on 12/22/2021 16:11:50 ?

## 2021-12-25 NOTE — Progress Notes (Signed)
SRISHTI, STRNAD ([id number]) ?Visit Report for 12/25/2021 ?HBO Details ?Patient Name: Date of Service: ?[name], [name] 12/25/2021 12:30 PM ?Medical Record Number: [id number] ?Patient Account Number: [id number] ?Date of Birth/Sex: Treating RN: ?22-May-1941 (81 y.o. 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 ?HBO Treatment Details ?Treatment Number: 38 ?Patient Type: Outpatient ?Chamber Type: Monoplace ?Chamber Serial #: [id number] ?Treatment Protocol: 2.0 ATA with 90 minutes oxygen, and no air breaks ?Treatment Details ?Compression Rate Down: 2.0 psi / minute ?De-Compression Rate Up: 2.0 psi / minute ?Air ?breaks ?and ?breathing Decompress Decompress ?Compress Tx Pressure ?Begins Reached periods Begins Ends ?(leave ?unused ?spaces ?blank) ?Chamber Pressure (ATA 1 2 ------2 1 ?) ?Clock Time (24 hr) 13:00 13:10 - - - - - - 14:41 14:49 ?Treatment Length: 109 (minutes) ?Treatment Segments: 4 ?Vital Signs ?Capillary Blood Glucose Reference Range: 80 - 120 mg / dl ?HBO Diabetic Blood Glucose Intervention Range: <131 mg/dl or >249 mg/dl ?Time Vitals Blood Respiratory Capillary Blood Glucose Pulse Action ?Type: ?Pulse: Temperature: ?Taken: ?Pressure: ?Rate: ?Glucose (mg/dl): ?Meter #: Oximetry (%) Taken: ?Pre 12:50 152/58 81 14 98 191 100 ?Post 14:57 154/42 44 16 97.6 128 100 ?Treatment Response ?Treatment Toleration: Well ?Treatment Completion Status: Treatment Completed without Adverse  Event ?Treatment Notes ?The patient did well today. She did not have any problems with her treatment. Dr. Heber [place] was informed of the patient's heart rate post-treatment. The patient ?had no complaints. ?Additional Procedure Documentation ?Tissue Sevierity: Fat layer exposed ?Neveen Daponte Notes ?Patient travelled at 1 psi/min until reaching 4 psi chamber pressure at which time the rate set was changed to 2 psi/min. Patient tolerated the pressurization ?phase well. ?Physician HBO Attestation: ?I certify that I supervised this HBO treatment in accordance with Medicare ?guidelines. A trained emergency response team is readily available per Yes ?hospital policies and procedures. ?Continue HBOT as ordered. Yes ?Electronic Signature(s) ?Signed: 12/25/2021 3:49:36 PM By: [name] DO ?Previous Signature: 12/25/2021 3:39:53 PM Version By: [name] EMT ?Previous Signature: 12/25/2021 1:28:10 PM Version By: [name] CHT EMT BS ?, , ?Entered By: [name] on 12/25/2021 15:48:19 ?-------------------------------------------------------------------------------- ?HBO Safety Checklist Details ?Patient Name: ?Date of Service: ?[name], [name] 12/25/2021 12:30 PM ?Medical Record Number: [id number] ?Patient Account Number: [id number] ?Date of Birth/Sex: ?Treating RN: ?03-03-1941 (81 y.o. [name], [name] ?Primary Care Demari Kropp: [name] ?Other Clinician: [name] ?Referring Sael Furches: ?Treating Luz Burcher/[name]: [name] ?[name] ?Weeks in Treatment: 27 ?HBO Safety Checklist Items ?Safety Checklist ?Consent Form Signed ?Patient voided / foley secured and emptied ?When did you last eato 0845 ?Last dose of injectable or oral agent 0850 ?Ostomy pouch emptied and vented if applicable ?NA ?All implantable devices assessed, documented and approved ?NA ?Intravenous access site secured and place ?NA ?Valuables secured ?Linens and cotton and cotton/polyester blend (less than 51%  polyester) ?Personal oil-based products / skin lotions / body lotions removed ?Wigs or hairpieces removed Patient removed item. ?Smoking or tobacco materials removed ?NA ?Books / newspapers / magazines / loose paper removed ?Cologne, aftershave, perfume and deodorant removed ?Jewelry removed (may wrap wedding band) ?Make-up removed ?Hair care  products removed ?Battery operated devices (external) removed ?Heating patches and chemical warmers removed ?Titanium eyewear removed ?Nail polish cured greater than 10 hours ?Casting material cured greater than 10 hours ?NA ?Hearing aids removed ?NA ?Loose dentures or partials removed ?NA ?Prosthetics have been removed ?NA ?Patient demonstrates correct use of air break device (if applicable) ?Patient concerns have been addressed ?Patient grounding bracelet on and cord attached to chamber ?Specifics for Inpatients (complete in addition to above) ?Medication sheet sent with patient ?NA ?Intravenous medications needed or due during therapy sent with patient ?NA ?Drainage tubes (e.g. nasogastric tube or chest tube secured and vented) ?NA ?Endotracheal or Tracheotomy tube secured ?NA ?Cuff deflated of air and inflated with saline ?NA ?Airway suctioned ?NA ?Notes ?Paper version used prior to treatment. ?Electronic Signature(s) ?Signed: 12/25/2021 1:28:10 PM By: [name] CHT EMT BS ?, , ?Entered By: [name] on 12/25/2021 13:23:53 ?

## 2021-12-25 NOTE — Progress Notes (Signed)
JILENE, SPOHR ([id number]) ?Visit Report for 12/25/2021 ?Problem List Details ?Patient Name: Date of Service: ?[name], [name] LLY H. 12/25/2021 12:30 PM ?Medical Record Number: [id number] ?Patient Account Number: [id number] ?Date of Birth/Sex: Treating RN: ?[birth year] (81 y.o. 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 2 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 06/15/2021 No Yes ?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, right ankle and foot 10/25/2021 No Yes ?Inactive Problems ?ICD-10 ?Code Description Active Date Inactive Date ?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 12/25/2021 ?Medical Record Number: [id number] ?Patient Account Number: [id number] ?Date of Birth/Sex: ?Treating RN: ?1941-04-12 (81 y.o. 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 ?Y92.446 Other chronic osteomyelitis, right ankle and foot ?Facility Procedures ?CPT4 Code: 28638177 ?Description: G0277-(Facility Use Only) HBOT full body chamber, 64mn , ICD-10 Diagnosis Description 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 M86.671 Other  ?chronic osteomyelitis, right ankle and foot T81.31XD Disruption of external operation (surgical) wound, not elsewhere classified, subse ?Modifier: quent encounter ?Quantity: 4 ?Physician Procedures ?: CPT4 Code Description Modifier [id number] 38333- WC PHYS HYPERBARIC OXYGEN THERAPY ICD-10 Diagnosis Description 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

## 2021-12-25 NOTE — Progress Notes (Addendum)
LESTA, [name] ([id number]) ?Visit Report for 12/25/2021 ?Arrival Information Details ?Patient Name: Date of Service: ?[name], [name] 12/25/2021 12:30 PM ?Medical Record Number: [id number] ?Patient Account Number: [id number] ?Date of Birth/Sex: Treating RN: ?1941-02-16 (81 y.o. [name], [name] ?Primary Care Olyn Landstrom: [name] ?Other Clinician: [name] ?Referring Laticia Vannostrand: ?Treating Asia Dusenbury/[name]: [name] ?[name] ?Weeks in Treatment: 27 ?Visit Information History Since Last Visit ?All ordered tests and consults were completed: Yes ?Patient Arrived: [name] ?Added or deleted any medications: No ?Arrival Time: 12:37 ?Any new allergies or adverse reactions: No ?Accompanied By: self ?Had a fall or experienced change in No ?Transfer Assistance: None ?activities of daily living that may affect ?Patient Identification Verified: Yes ?risk of falls: ?Secondary Verification Process Completed: Yes ?Signs or symptoms of abuse/neglect since last visito No ?Patient Requires Transmission-Based Precautions: No ?Hospitalized since last visit: No ?Patient Has Alerts: Yes ?Implantable device outside of the clinic excluding No ?Patient Alerts: Patient on Blood Thinner cellular tissue based products placed in the center ?since last visit: ?Pain Present Now: No ?Electronic Signature(s) ?Signed: 12/25/2021 1:28:10 PM By: [name] CHT EMT BS ?, , ?Entered By: [name] on 12/25/2021 13:22:00 ?-------------------------------------------------------------------------------- ?Encounter Discharge Information Details ?Patient Name: Date of Service: ?[name], [name] 12/25/2021 12:30 PM ?Medical Record Number: [id number] ?Patient Account Number: [id number] ?Date of Birth/Sex: Treating RN: ?May 09, 1941 (81 y.o. [name], [name] ?Primary Care Savi Lastinger: [name] ?Other Clinician: [name] ?Referring Jori Thrall: ?Treating Daemon Dowty/[name]: [name] ?[name] ?Weeks in Treatment: 27 ?Encounter Discharge Information Items ?Discharge Condition: Stable ?Ambulatory Status: [name] ?Discharge Destination: Home ?Transportation: Private Auto ?Accompanied By: None ?Schedule Follow-up Appointment: Yes ?Clinical Summary of Care: ?Electronic Signature(s) ?Signed: 12/25/2021 3:42:07 PM By: [name] EMT ?Entered By: [name] on 12/25/2021 15:42:07 ?-------------------------------------------------------------------------------- ?Vitals Details ?Patient Name: ?Date of Service: ?[name], [name] 12/25/2021 12:30 PM ?Medical Record Number: [id number] ?Patient Account Number: [id number] ?Date of Birth/Sex: ?Treating RN: ?1940-10-03 (81 y.o. [name], [name] ?Primary Care Yeilin Zweber: [name] ?Other Clinician: [name] ?Referring Aliese Brannum: ?Treating Baine Decesare/[name]: [name] ?[name] ?Weeks in Treatment: 27 ?Vital Signs ?Time Taken: 12:50 ?Temperature (??F): 98.0 ?Height (in): 64 ?Pulse (bpm): 81 ?Weight (lbs): 135 ?Respiratory Rate (breaths/min): 14 ?Body Mass Index (BMI): 23.2 ?Blood Pressure (mmHg): 152/58 ?Capillary Blood Glucose (mg/dl): 191 ?Reference Range: 80 - 120 mg / dl ?Airway ?Pulse Oximetry (%): 100 ?Electronic Signature(s) ?Signed: 12/25/2021 1:28:10 PM By: [name] CHT EMT BS ?, , ?Entered By: [name] on 12/25/2021 13:22:36 ?

## 2021-12-26 ENCOUNTER — Encounter (HOSPITAL_BASED_OUTPATIENT_CLINIC_OR_DEPARTMENT_OTHER): Payer: Medicare Other | Admitting: General Surgery

## 2021-12-26 ENCOUNTER — Ambulatory Visit: Payer: Medicare Other | Admitting: Internal Medicine

## 2021-12-26 ENCOUNTER — Encounter (HOSPITAL_BASED_OUTPATIENT_CLINIC_OR_DEPARTMENT_OTHER): Payer: Medicare Other | Admitting: Internal Medicine

## 2021-12-26 DIAGNOSIS — M86671 Other chronic osteomyelitis, right ankle and foot: Secondary | ICD-10-CM

## 2021-12-26 DIAGNOSIS — E11621 Type 2 diabetes mellitus with foot ulcer: Secondary | ICD-10-CM

## 2021-12-26 DIAGNOSIS — T8131XD Disruption of external operation (surgical) wound, not elsewhere classified, subsequent encounter: Secondary | ICD-10-CM | POA: Diagnosis not present

## 2021-12-26 DIAGNOSIS — L97514 Non-pressure chronic ulcer of other part of right foot with necrosis of bone: Secondary | ICD-10-CM | POA: Diagnosis not present

## 2021-12-26 DIAGNOSIS — E1151 Type 2 diabetes mellitus with diabetic peripheral angiopathy without gangrene: Secondary | ICD-10-CM | POA: Diagnosis not present

## 2021-12-26 LAB — GLUCOSE, CAPILLARY
Glucose-Capillary: 217 mg/dL — ABNORMAL HIGH (ref 70–99)
Glucose-Capillary: 271 mg/dL — ABNORMAL HIGH (ref 70–99)

## 2021-12-26 NOTE — Progress Notes (Signed)
[name], [name] ([id number]) ?Visit Report for 12/26/2021 ?Problem List Details ?Patient Name: Date of Service: ?[name], [name] 12/26/2021 8:00 A M ?Medical Record Number: [id number] ?Patient Account Number: [id number] ?Date of Birth/Sex: Treating RN: ?Feb 14, 1941 (81 y.o. 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 06/15/2021 No Yes ?Q73.419 Other chronic osteomyelitis, right ankle and foot 10/25/2021 No Yes ?Inactive Problems ?ICD-10 ?Code Description Active Date Inactive Date ?L97.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 12/26/2021 ?Medical Record Number: [id number] ?Patient Account Number: [id number] ?Date of Birth/Sex: ?Treating RN: ?[birth year] (81 y.o. 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 ?D53.299 Other chronic osteomyelitis, right ankle and foot ?Facility Procedures ?CPT4 Code: 24268341 ?Description: G0277-(Facility Use Only) HBOT full body chamber, 8mn , ICD-10 Diagnosis Description 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 M86.671 Other  ?chronic osteomyelitis, right ankle and foot T81.31XD Disruption of external operation (surgical) wound, not elsewhere classified, subse ?Modifier: quent encounter ?Quantity: 4 ?Physician Procedures ?: CPT4 Code Description Modifier [id number] 98921- WC PHYS HYPERBARIC OXYGEN THERAPY ICD-10 Diagnosis Description 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

## 2021-12-26 NOTE — Progress Notes (Addendum)
JAMIA, HOBAN ([id number]) ?Visit Report for 12/26/2021 ?Arrival Information Details ?Patient Name: Date of Service: ?[name], [name] 12/26/2021 8:00 A M ?Medical Record Number: [id number] ?Patient Account Number: [id number] ?Date of Birth/Sex: Treating RN: ?Jul 24, 1941 (81 y.o. [name], [name] ?Primary Care Rider Ermis: [name] ?Other Clinician: [name] ?Referring Meilin Brosh: ?Treating Naevia Unterreiner/[name]: [name] ?[name] ?Weeks in Treatment: 27 ?Visit Information History Since Last Visit ?All ordered tests and consults were completed: Yes ?Patient Arrived: [name] ?Added or deleted any medications: No ?Arrival Time: 08:20 ?Any new allergies or adverse reactions: No ?Accompanied By: None ?Had a fall or experienced change in No ?Transfer Assistance: None ?activities of daily living that may affect ?Patient Requires Transmission-Based Precautions: No ?risk of falls: ?Patient Has Alerts: Yes ?Signs or symptoms of abuse/neglect since last visito No ?Patient Alerts: Patient on Blood Thinner ?Hospitalized since last visit: No ?Implantable device outside of the clinic excluding No ?cellular tissue based products placed in the center ?since last visit: ?Pain Present Now: Unable to Respond ?Electronic Signature(s) ?Signed: 12/26/2021 8:42:27 AM By: [name] EMT ?Entered By: [name] on 12/26/2021 08:42:27 ?-------------------------------------------------------------------------------- ?Encounter Discharge Information Details ?Patient Name: Date of Service: ?[name], [name] 12/26/2021 8:00 A M ?Medical Record Number: [id number] ?Patient Account Number: [id number] ?Date of Birth/Sex: Treating RN: ?06/25/1941 (81 y.o. [name], [name] ?Primary Care [name]: [name] ?Other Clinician: [name] ?Referring Allycia Pitz: ?Treating Emerlyn Mehlhoff/[name]: [name] ?[name] ?Weeks in Treatment: 27 ?Encounter Discharge Information Items ?Discharge Condition:  Stable ?Ambulatory Status: [name] ?Discharge Destination: Home ?Transportation: Private Auto ?Accompanied By: None ?Schedule Follow-up Appointment: Yes ?Clinical Summary of Care: ?Electronic Signature(s) ?Signed: 12/26/2021 10:53:37 AM By: [name] EMT ?Entered By: [name] on 12/26/2021 10:53:36 ?-------------------------------------------------------------------------------- ?Vitals Details ?Patient Name: ?Date of Service: ?[name], [name] 12/26/2021 8:00 A M ?Medical Record Number: [id number] ?Patient Account Number: [id number] ?Date of Birth/Sex: ?Treating RN: ?[birth year] (81 y.o. [name], [name] ?Primary Care Selene Peltzer: [name] ?Other Clinician: [name] ?Referring Alphons Burgert: ?Treating Anayelli Lai/[name]: [name] ?[name] ?Weeks in Treatment: 27 ?Vital Signs ?Time Taken: 08:34 ?Temperature (??F): 98.0 ?Height (in): 64 ?Pulse (bpm): 81 ?Weight (lbs): 135 ?Respiratory Rate (breaths/min): 16 ?Body Mass Index (BMI): 23.2 ?Blood Pressure (mmHg): 145/70 ?Capillary Blood Glucose (mg/dl): 217 ?Reference Range: 80 - 120 mg / dl ?Airway ?Pulse Oximetry (%): 100 ?Electronic Signature(s) ?Signed: 12/26/2021 8:44:32 AM By: [name] EMT ?Entered By: [name] on 12/26/2021 08:44:31 ?

## 2021-12-26 NOTE — Progress Notes (Addendum)
[name], [name] ([id number]) ?Visit Report for 12/26/2021 ?HBO Details ?Patient Name: Date of Service: ?[name], [name] 12/26/2021 8:00 A M ?Medical Record Number: [id number] ?Patient Account Number: [id number] ?Date of Birth/Sex: Treating RN: ?[birth year] (81 y.o. 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 ?HBO Treatment Details ?Treatment Number: 35 ?Patient Type: Outpatient ?Chamber Type: Monoplace ?Chamber Serial #: [id number] ?Treatment Protocol: 2.0 ATA with 90 minutes oxygen, and no air breaks ?Treatment Details ?Compression Rate Down: 2.0 psi / minute De-Compression Rate Up: ?Air ?breaks ?and ?breathing Decompress Decompress ?Compress Tx Pressure ?Begins Reached periods Begins Ends ?(leave ?unused ?spaces ?blank) ?Chamber Pressure (ATA 1 2 ------2 1 ?) ?Clock Time (24 hr) 08:38 08:46 - - - - - - 10:17 10:25 ?Treatment Length: 107 (minutes) ?Treatment Segments: 4 ?Vital Signs ?Capillary Blood Glucose Reference Range: 80 - 120 mg / dl ?HBO Diabetic Blood Glucose Intervention Range: <131 mg/dl or >249 mg/dl ?Time Vitals Blood Respiratory Capillary Blood Glucose Pulse Action ?Type: ?Pulse: Temperature: ?Taken: ?Pressure: ?Rate: ?Glucose (mg/dl): ?Meter #: Oximetry (%) Taken: ?Pre 08:34 145/70 81 16 98 217 100 ?Post 10:32 145/83 68 16 98 271 ?Treatment Response ?Treatment Toleration: Well ?Treatment Completion Status: Treatment Completed without Adverse Event ?Treatment Notes ?The  patient did well today. She had no problem with her ears or sinus. Her post-CBG was 271 Dr. Heber Lostine informed. ?Additional Procedure Documentation ?Tissue Sevierity: Limited to breakdown of skin ?Physician HBO Attestation: ?I certify that I supervised this HBO treatment in accordance with Medicare ?guidelines. A trained emergency response team is readily available per Yes ?hospital policies and procedures. ?Continue HBOT as ordered. Yes ?Electronic Signature(s) ?Signed: 12/26/2021 4:16:40 PM By: [name] DO ?Previous Signature: 12/26/2021 10:49:57 AM Version By: [name] EMT ?Entered By: [name] on 12/26/2021 15:51:42 ?-------------------------------------------------------------------------------- ?HBO Safety Checklist Details ?Patient Name: ?Date of Service: ?[name], [name] 12/26/2021 8:00 A M ?Medical Record Number: [id number] ?Patient Account Number: [id number] ?Date of Birth/Sex: ?Treating RN: ?Mar 16, 1941 (81 y.o. [name], [name] ?Primary Care Conn Trombetta: [name] ?Other Clinician: [name] ?Referring Cornel Werber: ?Treating Wray Goehring/[name]: [name] ?[name] ?Weeks in Treatment: 27 ?HBO Safety Checklist Items ?Safety Checklist ?Consent Form Signed ?Patient voided / foley secured and emptied ?When did you last eato 0740 ?Last dose of injectable or oral agent 0745 ?Ostomy pouch emptied and vented if applicable ?NA ?All implantable devices assessed, documented and approved ?NA ?Intravenous access site secured and place ?NA ?Valuables secured ?Linens and cotton and cotton/polyester blend (less than 51% polyester) ?Personal oil-based products / skin lotions / body lotions removed ?Wigs or hairpieces removed Patient removed ?Smoking or tobacco materials removed ?Books / newspapers / magazines / loose paper removed ?Cologne, aftershave, perfume and deodorant removed ?Jewelry removed (may wrap wedding band) ?NA ?Make-up removed ?Hair care products removed ?Battery  operated devices (external) removed ?Heating patches and chemical warmers removed ?Titanium eyewear removed ?NA ?Nail polish cured greater than 10 hours ?NA ?Casting material cured greater than 10 hours ?NA ?Hearing aids removed ?NA ?Loose dentures or partials removed ?NA ?Prosthetics have been removed ?NA ?Patient demonstrates correct use of air  break device (if applicable) ?Patient concerns have been addressed ?Patient grounding bracelet on and cord attached to chamber ?Specifics for Inpatients (complete in addition to above) ?Medication sheet sent with patient ?NA ?Intravenous medications needed or due during therapy sent with patient ?NA ?Drainage tubes (e.g. nasogastric tube or chest tube secured and vented) ?NA ?Endotracheal or Tracheotomy tube secured ?NA ?Cuff deflated of air and inflated with saline ?NA ?Airway suctioned ?NA ?Notes ?Paper version used before treatment. The checklist was done before treatment started. ?Electronic Signature(s) ?Signed: 12/26/2021 8:47:49 AM By: [name] EMT ?Entered By: [name] on 12/26/2021 08:47:49 ?

## 2021-12-27 ENCOUNTER — Encounter (HOSPITAL_BASED_OUTPATIENT_CLINIC_OR_DEPARTMENT_OTHER): Payer: Medicare Other | Admitting: General Surgery

## 2021-12-27 ENCOUNTER — Encounter (HOSPITAL_BASED_OUTPATIENT_CLINIC_OR_DEPARTMENT_OTHER): Payer: Medicare Other | Admitting: Physician Assistant

## 2021-12-27 DIAGNOSIS — T8131XD Disruption of external operation (surgical) wound, not elsewhere classified, subsequent encounter: Secondary | ICD-10-CM | POA: Diagnosis not present

## 2021-12-27 DIAGNOSIS — L97512 Non-pressure chronic ulcer of other part of right foot with fat layer exposed: Secondary | ICD-10-CM | POA: Diagnosis not present

## 2021-12-27 DIAGNOSIS — M86671 Other chronic osteomyelitis, right ankle and foot: Secondary | ICD-10-CM | POA: Diagnosis not present

## 2021-12-27 DIAGNOSIS — L97514 Non-pressure chronic ulcer of other part of right foot with necrosis of bone: Secondary | ICD-10-CM | POA: Diagnosis not present

## 2021-12-27 DIAGNOSIS — E11621 Type 2 diabetes mellitus with foot ulcer: Secondary | ICD-10-CM | POA: Diagnosis not present

## 2021-12-27 DIAGNOSIS — E1151 Type 2 diabetes mellitus with diabetic peripheral angiopathy without gangrene: Secondary | ICD-10-CM | POA: Diagnosis not present

## 2021-12-27 LAB — GLUCOSE, CAPILLARY
Glucose-Capillary: 236 mg/dL — ABNORMAL HIGH (ref 70–99)
Glucose-Capillary: 164 mg/dL — ABNORMAL HIGH (ref 70–99)

## 2021-12-27 NOTE — Progress Notes (Signed)
KATHELINE, BRENDLINGER ([id number]) ?Visit Report for 12/27/2021 ?Problem List Details ?Patient Name: Date of Service: ?[name], [name] 12/27/2021 12:30 PM ?Medical Record Number: [id number] ?Patient Account Number: [id number] ?Date of Birth/Sex: Treating RN: ?[birth year] (81 y.o. 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 06/15/2021 No Yes ?H21.224 Other chronic osteomyelitis, right ankle and foot 10/25/2021 No Yes ?Inactive Problems ?ICD-10 ?Code Description Active Date Inactive Date ?L97.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 12/27/2021 ?Medical Record Number: [id number] ?Patient Account Number: [id number] ?Date of Birth/Sex: ?Treating RN: ?November 30, 1940 (81 y.o. 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 ?U88.916 Other chronic osteomyelitis, right ankle and foot ?Facility Procedures ?CPT4 Code: 94503888 ?Description: G0277-(Facility Use Only) HBOT full body chamber, 77mn , ICD-10 Diagnosis Description 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 M86.671 Other  ?chronic osteomyelitis, right ankle and foot T81.31XD Disruption of external operation (surgical) wound, not elsewhere classified, subse ?Modifier: quent encounter ?Quantity: 4 ?Physician Procedures ?: CPT4 Code Description Modifier [id number] 17915- WC PHYS HYPERBARIC OXYGEN THERAPY ICD-10 Diagnosis Description 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

## 2021-12-28 ENCOUNTER — Encounter (HOSPITAL_BASED_OUTPATIENT_CLINIC_OR_DEPARTMENT_OTHER): Payer: Medicare Other | Admitting: General Surgery

## 2021-12-28 DIAGNOSIS — M86671 Other chronic osteomyelitis, right ankle and foot: Secondary | ICD-10-CM | POA: Diagnosis not present

## 2021-12-28 DIAGNOSIS — T8131XD Disruption of external operation (surgical) wound, not elsewhere classified, subsequent encounter: Secondary | ICD-10-CM | POA: Diagnosis not present

## 2021-12-28 DIAGNOSIS — E11621 Type 2 diabetes mellitus with foot ulcer: Secondary | ICD-10-CM | POA: Diagnosis not present

## 2021-12-28 DIAGNOSIS — L97512 Non-pressure chronic ulcer of other part of right foot with fat layer exposed: Secondary | ICD-10-CM | POA: Diagnosis not present

## 2021-12-28 DIAGNOSIS — L97514 Non-pressure chronic ulcer of other part of right foot with necrosis of bone: Secondary | ICD-10-CM | POA: Diagnosis not present

## 2021-12-28 DIAGNOSIS — E1151 Type 2 diabetes mellitus with diabetic peripheral angiopathy without gangrene: Secondary | ICD-10-CM | POA: Diagnosis not present

## 2021-12-28 LAB — GLUCOSE, CAPILLARY
Glucose-Capillary: 217 mg/dL — ABNORMAL HIGH (ref 70–99)
Glucose-Capillary: 158 mg/dL — ABNORMAL HIGH (ref 70–99)

## 2021-12-28 NOTE — Progress Notes (Addendum)
[name], [name] ([id number]) ?Visit Report for 12/28/2021 ?HBO Details ?Patient Name: Date of Service: ?[name], [name] 12/28/2021 12:30 PM ?Medical Record Number: [id number] ?Patient Account Number: [id number] ?Date of Birth/Sex: Treating RN: ?1941-04-21 (81 y.o. 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 ?HBO Treatment Details ?Treatment Number: 36 ?Patient Type: Outpatient ?Chamber Type: Monoplace ?Chamber Serial #: [id number] ?Treatment Protocol: 2.0 ATA with 90 minutes oxygen, and no air breaks ?Treatment Details ?Compression Rate Down: 2.0 psi / minute ?De-Compression Rate Up: 2.0 psi / minute ?Air ?breaks ?and ?breathing Decompress Decompress ?Compress Tx Pressure ?Begins Reached periods Begins Ends ?(leave ?unused ?spaces ?blank) ?Chamber Pressure (ATA 1 2 ------2 1 ?) ?Clock Time (24 hr) 13:06 13:16 - - - - - - 14:46 14:56 ?Treatment Length: 110 (minutes) ?Treatment Segments: 4 ?Vital Signs ?Capillary Blood Glucose Reference Range: 80 - 120 mg / dl ?HBO Diabetic Blood Glucose Intervention Range: <131 mg/dl or >249 mg/dl ?Type: Time Vitals Blood Pulse: Respiratory Temperature: Capillary Blood Glucose Pulse Action ?Taken: ?Pressure: ?Rate: ?Glucose (mg/dl): Meter #: Oximetry (%) Taken: ?Pre 12:49 145/42 83 16 97.8 217 2 Informed physician of BP. ?Post 15:05 148/64 56 16 98 158 2 100 manual measurement of pulse. ?Pre 12:50 156/56 Manual BP ?Treatment Response ?Treatment  Toleration: Well ?Treatment Completion Status: Treatment Completed without Adverse Event ?Additional Procedure Documentation ?Tissue Sevierity: Fat layer exposed ?Physician HBO Attestation: ?I certify that I supervised this HBO treatment in accordance with Medicare ?guidelines. A trained emergency response team is readily available per Yes ?hospital policies and procedures. ?Continue HBOT as ordered. Yes ?Electronic Signature(s) ?Signed: 12/28/2021 5:17:22 PM By: [name] MD FACS ?Previous Signature: 12/28/2021 3:20:08 PM Version By: [name] CHT EMT BS ?, , ?Entered By: [name] on 12/28/2021 17:17:22 ?-------------------------------------------------------------------------------- ?HBO Safety Checklist Details ?Patient Name: ?Date of Service: ?[name], [name] 12/28/2021 12:30 PM ?Medical Record Number: [id number] ?Patient Account Number: [id number] ?Date of Birth/Sex: ?Treating RN: ?[birth year] (81 y.o. F) Boehlein, [name] ?Primary Care Micheala Morissette: [name] ?Other Clinician: [name] ?Referring Citlaly Camplin: ?Treating Ethanjames Fontenot/[name]: [name] ?[name] ?Weeks in Treatment: 28 ?HBO Safety Checklist Items ?Safety Checklist ?Consent Form Signed ?Patient voided / foley secured and emptied ?When did you last eato 1145 ?Last dose of injectable or oral agent 0815 ?Ostomy pouch emptied and vented if applicable ?NA ?All implantable devices assessed, documented and approved ?NA ?Intravenous access site secured and place ?NA ?Valuables secured ?Linens and cotton and cotton/polyester blend (less than 51% polyester) ?Personal oil-based products / skin lotions / body lotions removed ?Wigs or hairpieces removed Patient removed. ?Smoking or tobacco materials removed ?NA ?Books / newspapers / magazines / loose paper removed ?Cologne, aftershave, perfume and deodorant removed ?Jewelry removed (may wrap wedding band) ?Make-up removed ?Hair care products removed ?Battery operated  devices (external) removed ?Heating patches and chemical warmers removed ?Titanium eyewear removed ?NA ?Nail polish cured greater than 10 hours ?Casting material cured greater than 10 hours ?NA ?Hearing aids removed ?NA ?Loose dentures or partials removed ?NA ?Prosthetics have been removed ?NA ?Patient demonstrates correct use of air break device (if applicable) ?  Patient concerns have been addressed ?Patient grounding bracelet on and cord attached to chamber ?Specifics for Inpatients (complete in addition to above) ?Medication sheet sent with patient ?NA ?Intravenous medications needed or due during therapy sent with patient ?NA ?Drainage tubes (e.g. nasogastric tube or chest tube secured and vented) ?NA ?Endotracheal or Tracheotomy tube secured ?NA ?Cuff deflated of air and inflated with saline ?NA ?Airway suctioned ?NA ?Notes ?Paper version used prior to treatment. ?Electronic Signature(s) ?Signed: 12/28/2021 3:17:33 PM By: [name] CHT EMT BS ?, , ?Entered By: [name] on 12/28/2021 15:17:33 ?

## 2021-12-28 NOTE — Progress Notes (Signed)
GRAY, MAUGERI ([id number]) ?Visit Report for 12/27/2021 ?Arrival Information Details ?Patient Name: Date of Service: ?[name], [name] 12/27/2021 12:30 PM ?Medical Record Number: [id number] ?Patient Account Number: [id number] ?Date of Birth/Sex: Treating RN: ?Nov 10, 1940 (81 y.o. [name], [name] ?Primary Care Cadan Maggart: [name] ?Other Clinician: [name] ?Referring Devonta Blanford: ?Treating Valina Maes/[name]: [name] ?[name] ?Weeks in Treatment: 27 ?Visit Information History Since Last Visit ?All ordered tests and consults were completed: Yes ?Patient Arrived: Ambulatory ?Added or deleted any medications: No ?Arrival Time: 13:19 ?Any new allergies or adverse reactions: No ?Accompanied By: self ?Had a fall or experienced change in No ?Transfer Assistance: None ?activities of daily living that may affect ?Patient Identification Verified: Yes ?risk of falls: ?Secondary Verification Process Completed: Yes ?Signs or symptoms of abuse/neglect since last visito No ?Patient Requires Transmission-Based Precautions: No ?Hospitalized since last visit: No ?Patient Has Alerts: Yes ?Implantable device outside of the clinic excluding No ?Patient Alerts: Patient on Blood Thinner cellular tissue based products placed in the center ?since last visit: ?Pain Present Now: No ?Electronic Signature(s) ?Signed: 12/28/2021 3:40:39 PM By: [name] CHT EMT BS ?, , ?Entered By: [name] on 12/27/2021 13:42:56 ?-------------------------------------------------------------------------------- ?Encounter Discharge Information Details ?Patient Name: Date of Service: ?[name], [name] 12/27/2021 12:30 PM ?Medical Record Number: [id number] ?Patient Account Number: [id number] ?Date of Birth/Sex: Treating RN: ?Jan 24, 1941 (81 y.o. [name], [name] ?Primary Care Ajit Errico: [name] ?Other Clinician: [name] ?Referring Lakesia Dahle: ?Treating Jyaire Koudelka/[name]: [name] ?[name] ?Weeks in Treatment: 27 ?Encounter Discharge Information Items ?Discharge Condition: Stable ?Ambulatory Status: [name] ?Discharge Destination: Home ?Transportation: Private Auto ?Accompanied By: None ?Schedule Follow-up Appointment: Yes ?Clinical Summary of Care: ?Electronic Signature(s) ?Signed: 12/27/2021 3:33:16 PM By: [name] EMT ?Entered By: [name] on 12/27/2021 15:33:15 ?-------------------------------------------------------------------------------- ?Vitals Details ?Patient Name: ?Date of Service: ?[name], [name] 12/27/2021 12:30 PM ?Medical Record Number: [id number] ?Patient Account Number: [id number] ?Date of Birth/Sex: ?Treating RN: ?05-16-1941 (81 y.o. [name], [name] ?Primary Care Daxon Kyne: [name] ?Other Clinician: [name] ?Referring Paysen Goza: ?Treating Mathilde Mcwherter/[name]: [name] ?[name] ?Weeks in Treatment: 27 ?Vital Signs ?Time Taken: 13:10 ?Temperature (??F): 98.1 ?Height (in): 64 ?Pulse (bpm): 82 ?Weight (lbs): 135 ?Respiratory Rate (breaths/min): 16 ?Body Mass Index (BMI): 23.2 ?Blood Pressure (mmHg): 158/68 ?Capillary Blood Glucose (mg/dl): 236 ?Reference Range: 80 - 120 mg / dl ?Airway ?Pulse Oximetry (%): 100 ?Electronic Signature(s) ?Signed: 12/28/2021 3:40:39 PM By: [name] CHT EMT BS ?, , ?Entered By: [name] on 12/27/2021 13:43:26 ?

## 2021-12-28 NOTE — Progress Notes (Signed)
IRLANDA, CROGHAN ([id number]) ?Visit Report for 12/27/2021 ?HBO Details ?Patient Name: Date of Service: ?[name], [name] 12/27/2021 12:30 PM ?Medical Record Number: [id number] ?Patient Account Number: [id number] ?Date of Birth/Sex: Treating RN: ?11-Jan-1941 (81 y.o. 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 ?HBO Treatment Details ?Treatment Number: 35 ?Patient Type: Outpatient ?Chamber Type: Monoplace ?Chamber Serial #: [id number] ?Treatment Protocol: 2.0 ATA with 90 minutes oxygen, and no air breaks ?Treatment Details ?Compression Rate Down: 2.0 psi / minute ?De-Compression Rate Up: 2.0 psi / minute ?Air ?breaks ?and ?breathing Decompress Decompress ?Compress Tx Pressure ?Begins Reached periods Begins Ends ?(leave ?unused ?spaces ?blank) ?Chamber Pressure (ATA 1 2 ------2 1 ?) ?Clock Time (24 hr) 13:19 13:30 - - - - - - 15:00 15:08 ?Treatment Length: 109 (minutes) ?Treatment Segments: 4 ?Vital Signs ?Capillary Blood Glucose Reference Range: 80 - 120 mg / dl ?HBO Diabetic Blood Glucose Intervention Range: <131 mg/dl or >249 mg/dl ?Time Vitals Blood Respiratory Capillary Blood Glucose Pulse Action ?Type: ?Pulse: Temperature: ?Taken: ?Pressure: ?Rate: ?Glucose (mg/dl): ?Meter #: Oximetry (%) Taken: ?Pre 13:10 158/68 82 16 98.1 236 2 100 ?Post 15:14 164/66 67 16 98.1 164 ?Treatment Response ?Treatment Toleration: Well ?Treatment Completion Status: Treatment Completed without Adverse  Event ?Treatment Notes ?The patient did well today during her treatment. I asked after her treatment if she had any problems with her ears or sinus. She stated no. ?Additional Procedure Documentation ?Tissue Sevierity: Fat layer exposed ?Physician HBO Attestation: ?I certify that I supervised this HBO treatment in accordance with Medicare ?guidelines. A trained emergency response team is readily available per Yes ?hospital policies and procedures. ?Continue HBOT as ordered. Yes ?Electronic Signature(s) ?Signed: 12/27/2021 4:40:19 PM By: [name] MD FACS ?Previous Signature: 12/27/2021 3:37:55 PM Version By: [name] EMT ?Previous Signature: 12/27/2021 3:27:58 PM Version By: [name] EMT ?Entered By: [name] on 12/27/2021 16:40:19 ?-------------------------------------------------------------------------------- ?HBO Safety Checklist Details ?Patient Name: ?Date of Service: ?[name], [name] 12/27/2021 12:30 PM ?Medical Record Number: [id number] ?Patient Account Number: [id number] ?Date of Birth/Sex: ?Treating RN: ?07-19-1941 (81 y.o. [name], [name] ?Primary Care Noelie Renfrow: [name] ?Other Clinician: [name] ?Referring Nychelle Cassata: ?Treating Sanjuanita Condrey/[name]: [name] ?[name] ?Weeks in Treatment: 27 ?HBO Safety Checklist Items ?Safety Checklist ?Consent Form Signed ?Patient voided / foley secured and emptied ?When did you last eato 0900 ?Last dose of injectable or oral agent 0900 ?Ostomy pouch emptied and vented if applicable ?NA ?All implantable devices assessed, documented and approved ?NA ?Intravenous access site secured and place ?NA ?Valuables secured ?Linens and cotton and cotton/polyester blend (less than 51% polyester) ?Personal oil-based products / skin lotions / body lotions removed ?Wigs or hairpieces removed Patient removed item. ?Smoking or tobacco materials removed ?NA ?Books / newspapers / magazines / loose paper removed ?Cologne, aftershave,  perfume and deodorant removed ?Jewelry removed (may wrap wedding band) ?Make-up removed ?Hair care products removed ?Battery operated devices (external) removed ?Heating patches and chemical warmers removed ?NA ?Titanium eyewear removed ?Nail polish cured greater than 10 hours ?Casting material cured greater than 10 hours ?NA ?Hearing aids removed ?NA ?  Loose dentures or partials removed ?NA ?Prosthetics have been removed ?NA ?Patient demonstrates correct use of air break device (if applicable) ?Patient concerns have been addressed ?Patient grounding bracelet on and cord attached to chamber ?Specifics for Inpatients (complete in addition to above) ?Medication sheet sent with patient ?NA ?Intravenous medications needed or due during therapy sent with patient ?NA ?Drainage tubes (e.g. nasogastric tube or chest tube secured and vented) ?NA ?Endotracheal or Tracheotomy tube secured ?NA ?Cuff deflated of air and inflated with saline ?NA ?Airway suctioned ?NA ?Notes ?Paper version used prior to treatment. ?Electronic Signature(s) ?Signed: 12/28/2021 3:40:39 PM By: [name] CHT EMT BS ?, , ?Entered By: [name] on 12/27/2021 13:46:02 ?

## 2021-12-28 NOTE — Progress Notes (Signed)
[name], [name] ([id number]) ?Visit Report for 12/28/2021 ?Problem List Details ?Patient Name: Date of Service: ?[name], [name] LLY H. 12/28/2021 12:30 PM ?Medical Record Number: [id number] ?Patient Account Number: [id number] ?Date of Birth/Sex: Treating RN: ?[birth year] (81 y.o. 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 06/15/2021 No Yes ?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, right ankle and foot 10/25/2021 No Yes ?Inactive Problems ?ICD-10 ?Code Description Active Date Inactive Date ?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 12/28/2021 ?Medical Record Number: [id number] ?Patient Account Number: [id number] ?Date of Birth/Sex: ?Treating RN: ?December 20, 1940 (81 y.o. 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, right ankle and foot ?Facility Procedures ?CPT4 Code: 00511021 ?Description: G0277-(Facility Use Only) HBOT full body chamber, 85mn , ICD-10 Diagnosis Description 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 M86.671 Other  ?chronic osteomyelitis, right ankle and foot T81.31XD Disruption of external operation (surgical) wound, not elsewhere classified, subse ?Modifier: quent encounter ?Quantity: 4 ?Physician Procedures ?: CPT4 Code Description Modifier [id number] 01410- WC PHYS HYPERBARIC OXYGEN THERAPY ICD-10 Diagnosis Description 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

## 2021-12-28 NOTE — Progress Notes (Signed)
IRIDIANA, FONNER ([id number]) ?Visit Report for 12/28/2021 ?Arrival Information Details ?Patient Name: Date of Service: ?[name], [name] 12/28/2021 12:30 PM ?Medical Record Number: [id number] ?Patient Account Number: [id number] ?Date of Birth/Sex: Treating RN: ?[birth year] (81 y.o. F) Boehlein, [name] ?Primary Care Daira Hine: [name] ?Other Clinician: [name] ?Referring Reis Pienta: ?Treating Mazie Fencl/[name]: [name] ?[name] ?Weeks in Treatment: 28 ?Visit Information History Since Last Visit ?All ordered tests and consults were completed: Yes ?Patient Arrived: [name] ?Added or deleted any medications: No ?Arrival Time: 12:47 ?Any new allergies or adverse reactions: No ?Accompanied By: self ?Had a fall or experienced change in No ?Transfer Assistance: None ?activities of daily living that may affect ?Patient Identification Verified: Yes ?risk of falls: ?Secondary Verification Process Completed: Yes ?Signs or symptoms of abuse/neglect since last visito No ?Patient Requires Transmission-Based Precautions: No ?Hospitalized since last visit: No ?Patient Has Alerts: Yes ?Implantable device outside of the clinic excluding No ?Patient Alerts: Patient on Blood Thinner cellular tissue based products placed in the center ?since last visit: ?Pain Present Now: No ?Electronic Signature(s) ?Signed: 12/28/2021 3:14:11 PM By: [name] CHT EMT BS ?, , ?Entered By: [name] on 12/28/2021 15:14:11 ?-------------------------------------------------------------------------------- ?Vitals Details ?Patient Name: Date of Service: ?[name], [name] 12/28/2021 12:30 PM ?Medical Record Number: [id number] ?Patient Account Number: [id number] ?Date of Birth/Sex: Treating RN: ?1941/07/14 (81 y.o. F) Boehlein, [name] ?Primary Care Dusan Lipford: [name] ?Other Clinician: [name] ?Referring Verla Bryngelson: ?Treating Peaches Vanoverbeke/[name]: [name] ?[name] ?Weeks in Treatment:  28 ?Vital Signs ?Time Taken: 12:49 ?Temperature (??F): 97.8 ?Height (in): 64 ?Pulse (bpm): 83 ?Weight (lbs): 135 ?Respiratory Rate (breaths/min): 16 ?Body Mass Index (BMI): 23.2 ?Blood Pressure (mmHg): 145/42 ?Capillary Blood Glucose (mg/dl): 217 ?Reference Range: 80 - 120 mg / dl ?Electronic Signature(s) ?Signed: 12/28/2021 3:15:49 PM By: [name] CHT EMT BS ?, , ?Entered By: [name] on 12/28/2021 15:15:49 ?

## 2021-12-29 ENCOUNTER — Encounter (HOSPITAL_BASED_OUTPATIENT_CLINIC_OR_DEPARTMENT_OTHER): Payer: Medicare Other | Admitting: Internal Medicine

## 2021-12-29 ENCOUNTER — Encounter (HOSPITAL_BASED_OUTPATIENT_CLINIC_OR_DEPARTMENT_OTHER): Payer: Medicare Other | Admitting: General Surgery

## 2021-12-29 DIAGNOSIS — L97512 Non-pressure chronic ulcer of other part of right foot with fat layer exposed: Secondary | ICD-10-CM | POA: Diagnosis not present

## 2021-12-29 DIAGNOSIS — I70202 Unspecified atherosclerosis of native arteries of extremities, left leg: Secondary | ICD-10-CM | POA: Diagnosis not present

## 2021-12-29 DIAGNOSIS — E1151 Type 2 diabetes mellitus with diabetic peripheral angiopathy without gangrene: Secondary | ICD-10-CM | POA: Diagnosis not present

## 2021-12-29 DIAGNOSIS — D638 Anemia in other chronic diseases classified elsewhere: Secondary | ICD-10-CM | POA: Diagnosis not present

## 2021-12-29 DIAGNOSIS — E11628 Type 2 diabetes mellitus with other skin complications: Secondary | ICD-10-CM | POA: Diagnosis not present

## 2021-12-29 DIAGNOSIS — E11621 Type 2 diabetes mellitus with foot ulcer: Secondary | ICD-10-CM | POA: Diagnosis not present

## 2021-12-29 DIAGNOSIS — L97514 Non-pressure chronic ulcer of other part of right foot with necrosis of bone: Secondary | ICD-10-CM | POA: Diagnosis not present

## 2021-12-29 DIAGNOSIS — T8131XD Disruption of external operation (surgical) wound, not elsewhere classified, subsequent encounter: Secondary | ICD-10-CM | POA: Diagnosis not present

## 2021-12-29 DIAGNOSIS — Z4781 Encounter for orthopedic aftercare following surgical amputation: Secondary | ICD-10-CM | POA: Diagnosis not present

## 2021-12-29 DIAGNOSIS — Z89412 Acquired absence of left great toe: Secondary | ICD-10-CM | POA: Diagnosis not present

## 2021-12-29 DIAGNOSIS — M86671 Other chronic osteomyelitis, right ankle and foot: Secondary | ICD-10-CM | POA: Diagnosis not present

## 2021-12-29 DIAGNOSIS — I10 Essential (primary) hypertension: Secondary | ICD-10-CM | POA: Diagnosis not present

## 2021-12-29 LAB — GLUCOSE, CAPILLARY
Glucose-Capillary: 197 mg/dL — ABNORMAL HIGH (ref 70–99)
Glucose-Capillary: 165 mg/dL — ABNORMAL HIGH (ref 70–99)

## 2021-12-29 NOTE — Progress Notes (Addendum)
[name], [name] ([id number]) ?Visit Report for 12/29/2021 ?Arrival Information Details ?Patient Name: Date of Service: ?[name], [name] 12/29/2021 9:30 A M ?Medical Record Number: [id number] ?Patient Account Number: [id number] ?Date of Birth/Sex: Treating RN: ?Jun 04, 1941 (80 y.o. F) [name], [name] ?Primary Care Orlandria Kissner: [name] ?Other Clinician: [name] ?Referring Nakeia Calvi: ?Treating Denyse Fillion/[name]: [name] ?[name] ?Weeks in Treatment: 28 ?Visit Information History Since Last Visit ?All ordered tests and consults were completed: Yes ?Patient Arrived: [name] ?Added or deleted any medications: No ?Arrival Time: 09:37 ?Any new allergies or adverse reactions: No ?Accompanied By: self ?Had a fall or experienced change in No ?Transfer Assistance: None ?activities of daily living that may affect ?Patient Identification Verified: Yes ?risk of falls: ?Secondary Verification Process Completed: Yes ?Signs or symptoms of abuse/neglect since last visito No ?Patient Requires Transmission-Based Precautions: No ?Hospitalized since last visit: No ?Patient Has Alerts: Yes ?Implantable device outside of the clinic excluding No ?Patient Alerts: Patient on Blood Thinner cellular tissue based products placed in the center ?since last visit: ?Pain Present Now: No ?Electronic Signature(s) ?Signed: 12/29/2021 10:49:49 AM By: [name] CHT EMT BS ?, , ?Entered By: [name] on 12/29/2021 10:45:48 ?-------------------------------------------------------------------------------- ?Encounter Discharge Information Details ?Patient Name: Date of Service: ?[name], [name] 12/29/2021 9:30 A M ?Medical Record Number: [id number] ?Patient Account Number: [id number] ?Date of Birth/Sex: Treating RN: ?[birth year] (81 y.o. F) [name], [name] ?Primary Care Deshondra Worst: [name] ?Other Clinician: [name] ?Referring Khloey Chern: ?Treating Aubria Vanecek/[name]: [name] ?[name] ?Weeks in Treatment: 28 ?Encounter Discharge Information Items ?Discharge Condition: Stable ?Ambulatory Status: Ambulatory ?Discharge Destination: Home ?Transportation: Private Auto ?Accompanied By: self ?Schedule Follow-up Appointment: No ?Clinical Summary of Care: ?Electronic Signature(s) ?Signed: 01/01/2022 6:44:01 PM By: [name] CHT EMT BS ?, , ?Entered By: [name] on 12/29/2021 13:59:22 ?-------------------------------------------------------------------------------- ?Vitals Details ?Patient Name: ?Date of Service: ?[name], [name] 12/29/2021 9:30 A M ?Medical Record Number: [id number] ?Patient Account Number: [id number] ?Date of Birth/Sex: ?Treating RN: ?1941/02/25 (81 y.o. F) [name], [name] ?Primary Care Evania Lyne: [name] ?Other Clinician: [name] ?Referring Nattalie Santiesteban: ?Treating Taiya Nutting/[name]: [name] ?[name] ?Weeks in Treatment: 28 ?Vital Signs ?Time Taken: 09:54 ?Temperature (??F): 97.6 ?Height (in): 64 ?Pulse (bpm): 74 ?Weight (lbs): 135 ?Respiratory Rate (breaths/min): 14 ?Body Mass Index (BMI): 23.2 ?Blood Pressure (mmHg): 148/54 ?Capillary Blood Glucose (mg/dl): 197 ?Reference Range: 80 - 120 mg / dl ?Airway ?Pulse Oximetry (%): 100 ?Electronic Signature(s) ?Signed: 12/29/2021 10:49:49 AM By: [name] CHT EMT BS ?, , ?Entered By: [name] on 12/29/2021 10:46:19 ?

## 2021-12-29 NOTE — Progress Notes (Signed)
[name], RAMOS ([id number]) ?Visit Report for 12/29/2021 ?Problem List Details ?Patient Name: Date of Service: ?[name], [name] 12/29/2021 9:30 A M ?Medical Record Number: [id number] ?Patient Account Number: [id number] ?Date of Birth/Sex: Treating RN: ?[birth year] (81 y.o. 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 06/15/2021 No Yes ?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, right ankle and foot 10/25/2021 No Yes ?Inactive Problems ?ICD-10 ?Code Description Active Date Inactive Date ?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 12/29/2021 ?Medical Record Number: [id number] ?Patient Account Number: [id number] ?Date of Birth/Sex: ?Treating RN: ?[birth year] (81 y.o. 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 ?E28.003 Other chronic osteomyelitis, right ankle and foot ?Facility Procedures ?CPT4 Code: 49179150 ?Description: G0277-(Facility Use Only) HBOT full body chamber, 52mn , ICD-10 Diagnosis Description 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 M86.671 Other  ?chronic osteomyelitis, right ankle and foot T81.31XD Disruption of external operation (surgical) wound, not elsewhere classified, subse ?Modifier: quent encounter ?Quantity: 4 ?Physician Procedures ?: CPT4 Code Description Modifier [id number]- WC PHYS HYPERBARIC OXYGEN THERAPY ICD-10 Diagnosis Description 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

## 2021-12-29 NOTE — Progress Notes (Addendum)
RASHAUNA, TEP ([id number]) ?Visit Report for 12/29/2021 ?HBO Details ?Patient Name: Date of Service: ?[name], [name] 12/29/2021 9:30 A M ?Medical Record Number: [id number] ?Patient Account Number: [id number] ?Date of Birth/Sex: Treating RN: ?[birth year] (81 y.o. 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 ?HBO Treatment Details ?Treatment Number: 20 ?Patient Type: Outpatient ?Chamber Type: Monoplace ?Chamber Serial #: [id number] ?Treatment Protocol: 2.0 ATA with 90 minutes oxygen, and no air breaks ?Treatment Details ?Compression Rate Down: 2.0 psi / minute ?De-Compression Rate Up: 2.0 psi / minute ?Air ?breaks ?and ?breathing Decompress Decompress ?Compress Tx Pressure ?Begins Reached periods Begins Ends ?(leave ?unused ?spaces ?blank) ?Chamber Pressure (ATA 1 2 ------2 1 ?) ?Clock Time (24 hr) 10:03 10:13 - - - - - - 11:43 11:50 ?Treatment Length: 107 (minutes) ?Treatment Segments: 4 ?Vital Signs ?Capillary Blood Glucose Reference Range: 80 - 120 mg / dl ?HBO Diabetic Blood Glucose Intervention Range: <131 mg/dl or >249 mg/dl ?Time Vitals Blood Respiratory Capillary Blood Glucose Pulse Action ?Type: ?Pulse: Temperature: ?Taken: ?Pressure: ?Rate: ?Glucose (mg/dl): ?Meter #: Oximetry (%) Taken: ?Pre 09:54 148/54 74 14 97.6 197 2 100 ?Post 11:55 148/58 50 16 98 165 2 ?Treatment Response ?Treatment Completion Status: Treatment Completed without Adverse Event ?Treatment Notes ?Informed  physician of diastolic blood pressure (pre- and post- treatment) as well as pulse rate. Patient tolerated treatment well. ?Additional Procedure Documentation ?Tissue Sevierity: Fat layer exposed ?Physician HBO Attestation: ?I certify that I supervised this HBO treatment in accordance with Medicare ?guidelines. A trained emergency response team is readily available per Yes ?hospital policies and procedures. ?Continue HBOT as ordered. Yes ?Electronic Signature(s) ?Signed: 12/29/2021 4:19:02 PM By: [name] MD FACS ?Entered By: [name] on 12/29/2021 16:19:01 ?-------------------------------------------------------------------------------- ?HBO Safety Checklist Details ?Patient Name: ?Date of Service: ?[name], [name] 12/29/2021 9:30 A M ?Medical Record Number: [id number] ?Patient Account Number: [id number] ?Date of Birth/Sex: ?Treating RN: ?02-22-1941 (81 y.o. F) [name], [name] ?Primary Care Silveria Botz: [name] ?Other Clinician: [name] ?Referring Adiana Smelcer: ?Treating Tanza Pellot/[name]: [name] ?[name] ?Weeks in Treatment: 28 ?HBO Safety Checklist Items ?Safety Checklist ?Consent Form Signed ?Patient voided / foley secured and emptied ?When did you last eato 0815 ?Last dose of injectable or oral agent 0815 ?Ostomy pouch emptied and vented if applicable ?NA ?All implantable devices assessed, documented and approved ?NA ?Intravenous access site secured and place ?NA ?Valuables secured ?Linens and cotton and cotton/polyester blend (less than 51% polyester) ?Personal oil-based products / skin lotions / body lotions removed ?Wigs or hairpieces removed Patient removed item ?Smoking or tobacco materials removed ?NA ?Books / newspapers / magazines / loose paper removed ?Cologne, aftershave, perfume and deodorant removed ?Jewelry removed (may wrap wedding band) ?Make-up removed ?Hair care products removed ?Battery operated devices (external) removed ?Heating patches and  chemical warmers removed ?Titanium eyewear removed ?Nail polish cured greater than 10 hours ?Casting material cured greater than 10 hours ?NA ?Hearing aids removed ?NA ?Loose dentures or partials removed ?NA ?Prosthetics have been removed ?NA ?Patient demonstrates correct use of air break device (if applicable) ?Patient concerns have been addressed ?Patient grounding  bracelet on and cord attached to chamber ?Specifics for Inpatients (complete in addition to above) ?Medication sheet sent with patient ?NA ?Intravenous medications needed or due during therapy sent with patient ?NA ?Drainage tubes (e.g. nasogastric tube or chest tube secured and vented) ?NA ?Endotracheal or Tracheotomy tube secured ?NA ?Cuff deflated of air and inflated with saline ?NA ?Airway suctioned ?NA ?Notes ?Paper version used prior to treatment. ?Electronic Signature(s) ?Signed: 12/29/2021 10:49:49 AM By: [name] CHT EMT BS ?, , ?Entered By: [name] on 12/29/2021 10:47:39 ?

## 2022-01-01 ENCOUNTER — Encounter (HOSPITAL_BASED_OUTPATIENT_CLINIC_OR_DEPARTMENT_OTHER): Payer: Medicare Other | Admitting: Internal Medicine

## 2022-01-01 ENCOUNTER — Encounter (HOSPITAL_BASED_OUTPATIENT_CLINIC_OR_DEPARTMENT_OTHER): Payer: Medicare Other | Admitting: General Surgery

## 2022-01-01 DIAGNOSIS — L97514 Non-pressure chronic ulcer of other part of right foot with necrosis of bone: Secondary | ICD-10-CM | POA: Diagnosis not present

## 2022-01-01 DIAGNOSIS — T8131XD Disruption of external operation (surgical) wound, not elsewhere classified, subsequent encounter: Secondary | ICD-10-CM | POA: Diagnosis not present

## 2022-01-01 DIAGNOSIS — E1151 Type 2 diabetes mellitus with diabetic peripheral angiopathy without gangrene: Secondary | ICD-10-CM | POA: Diagnosis not present

## 2022-01-01 DIAGNOSIS — I70202 Unspecified atherosclerosis of native arteries of extremities, left leg: Secondary | ICD-10-CM | POA: Diagnosis not present

## 2022-01-01 DIAGNOSIS — Z89412 Acquired absence of left great toe: Secondary | ICD-10-CM | POA: Diagnosis not present

## 2022-01-01 DIAGNOSIS — M86671 Other chronic osteomyelitis, right ankle and foot: Secondary | ICD-10-CM | POA: Diagnosis not present

## 2022-01-01 DIAGNOSIS — D638 Anemia in other chronic diseases classified elsewhere: Secondary | ICD-10-CM | POA: Diagnosis not present

## 2022-01-01 DIAGNOSIS — L97512 Non-pressure chronic ulcer of other part of right foot with fat layer exposed: Secondary | ICD-10-CM | POA: Diagnosis not present

## 2022-01-01 DIAGNOSIS — E11621 Type 2 diabetes mellitus with foot ulcer: Secondary | ICD-10-CM | POA: Diagnosis not present

## 2022-01-01 DIAGNOSIS — Z4781 Encounter for orthopedic aftercare following surgical amputation: Secondary | ICD-10-CM | POA: Diagnosis not present

## 2022-01-01 DIAGNOSIS — I10 Essential (primary) hypertension: Secondary | ICD-10-CM | POA: Diagnosis not present

## 2022-01-01 DIAGNOSIS — E11628 Type 2 diabetes mellitus with other skin complications: Secondary | ICD-10-CM | POA: Diagnosis not present

## 2022-01-01 LAB — GLUCOSE, CAPILLARY
Glucose-Capillary: 154 mg/dL — ABNORMAL HIGH (ref 70–99)
Glucose-Capillary: 69 mg/dL — ABNORMAL LOW (ref 70–99)

## 2022-01-01 NOTE — Progress Notes (Signed)
ANNIEMAE, HABERKORN ([id number]) ?Visit Report for 01/01/2022 ?Arrival Information Details ?Patient Name: Date of Service: ?[name], [name] 01/01/2022 12:30 PM ?Medical Record Number: [id number] ?Patient Account Number: [id number] ?Date of Birth/Sex: Treating RN: ?1941/04/01 (81 y.o. F) [name], [name] ?Primary Care Zebedee Segundo: [name] ?Other Clinician: [name] ?Referring Emigdio Wildeman: ?Treating Labrea Eccleston/[name]: [name] ?[name] ?Weeks in Treatment: 28 ?Visit Information History Since Last Visit ?All ordered tests and consults were completed: Yes ?Patient Arrived: [name] ?Added or deleted any medications: No ?Arrival Time: 12:40 ?Any new allergies or adverse reactions: No ?Accompanied By: self ?Had a fall or experienced change in No ?Transfer Assistance: None ?activities of daily living that may affect ?Patient Identification Verified: Yes ?risk of falls: ?Secondary Verification Process Completed: Yes ?Signs or symptoms of abuse/neglect since last visito No ?Patient Requires Transmission-Based Precautions: No ?Hospitalized since last visit: No ?Patient Has Alerts: Yes ?Implantable device outside of the clinic excluding No ?Patient Alerts: Patient on Blood Thinner cellular tissue based products placed in the center ?since last visit: ?Pain Present Now: No ?Electronic Signature(s) ?Signed: 01/01/2022 6:44:01 PM By: [name] CHT EMT BS ?, , ?Entered By: [name] on 01/01/2022 15:32:19 ?-------------------------------------------------------------------------------- ?Encounter Discharge Information Details ?Patient Name: Date of Service: ?[name], [name] 01/01/2022 12:30 PM ?Medical Record Number: [id number] ?Patient Account Number: [id number] ?Date of Birth/Sex: Treating RN: ?04/19/1941 (81 y.o. F) [name], [name] ?Primary Care Marygrace Sandoval: [name] ?Other Clinician: [name] ?Referring Allayah Raineri: ?Treating Owin Vignola/[name]: [name] ?[name] ?Weeks in Treatment: 28 ?Encounter Discharge Information Items ?Discharge Condition: Stable ?Ambulatory Status: [name] ?Discharge Destination: Home ?Transportation: Private Auto ?Accompanied By: self ?Schedule Follow-up Appointment: No ?Clinical Summary of Care: ?Electronic Signature(s) ?Signed: 01/01/2022 6:44:01 PM By: [name] CHT EMT BS ?, , ?Entered By: [name] on 01/01/2022 15:41:41 ?-------------------------------------------------------------------------------- ?Vitals Details ?Patient Name: ?Date of Service: ?[name], [name] 01/01/2022 12:30 PM ?Medical Record Number: [id number] ?Patient Account Number: [id number] ?Date of Birth/Sex: ?Treating RN: ?April 21, 1941 (81 y.o. F) [name], [name] ?Primary Care Destina Mantei: [name] ?Other Clinician: [name] ?Referring Tiffanyann Deroo: ?Treating Marckus Hanover/[name]: [name] ?[name] ?Weeks in Treatment: 28 ?Vital Signs ?Time Taken: 13:01 ?Temperature (??F): 97.5 ?Height (in): 64 ?Pulse (bpm): 67 ?Weight (lbs): 135 ?Respiratory Rate (breaths/min): 16 ?Body Mass Index (BMI): 23.2 ?Blood Pressure (mmHg): 152/54 ?Capillary Blood Glucose (mg/dl): 154 ?Reference Range: 80 - 120 mg / dl ?Electronic Signature(s) ?Signed: 01/01/2022 6:44:01 PM By: [name] CHT EMT BS ?, , ?Entered By: [name] on 01/01/2022 15:08:12 ?

## 2022-01-01 NOTE — Progress Notes (Signed)
DAILYNN, NANCARROW ([id number]) ?Visit Report for 01/01/2022 ?Problem List Details ?Patient Name: Date of Service: ?[name], [name] 01/01/2022 12:30 PM ?Medical Record Number: [id number] ?Patient Account Number: [id number] ?Date of Birth/Sex: Treating RN: ?[birth year] (81 y.o. 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 06/15/2021 No Yes ?X79.390 Other chronic osteomyelitis, right ankle and foot 10/25/2021 No Yes ?Inactive Problems ?ICD-10 ?Code Description Active Date Inactive Date ?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 01/01/2022 ?Medical Record Number: [id number] ?Patient Account Number: [id number] ?Date of Birth/Sex: ?Treating RN: ?11/03/1940 (81 y.o. 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 ?T62.263 Other chronic osteomyelitis, right ankle and foot ?Facility Procedures ?CPT4 Code: 33545625 ?Description: G0277-(Facility Use Only) HBOT full body chamber, 24mn , ICD-10 Diagnosis Description 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 M86.671 Other  ?chronic osteomyelitis, right ankle and foot T81.31XD Disruption of external operation (surgical) wound, not elsewhere classified, subse ?Modifier: quent encounter ?Quantity: 4 ?Physician Procedures ?: CPT4 Code Description Modifier [id number] 42876- WC PHYS HYPERBARIC OXYGEN THERAPY ICD-10 Diagnosis Description 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

## 2022-01-02 ENCOUNTER — Encounter (HOSPITAL_BASED_OUTPATIENT_CLINIC_OR_DEPARTMENT_OTHER): Payer: Medicare Other | Admitting: General Surgery

## 2022-01-02 DIAGNOSIS — E11621 Type 2 diabetes mellitus with foot ulcer: Secondary | ICD-10-CM | POA: Diagnosis not present

## 2022-01-02 DIAGNOSIS — T8131XD Disruption of external operation (surgical) wound, not elsewhere classified, subsequent encounter: Secondary | ICD-10-CM | POA: Diagnosis not present

## 2022-01-02 DIAGNOSIS — E1151 Type 2 diabetes mellitus with diabetic peripheral angiopathy without gangrene: Secondary | ICD-10-CM | POA: Diagnosis not present

## 2022-01-02 DIAGNOSIS — L97512 Non-pressure chronic ulcer of other part of right foot with fat layer exposed: Secondary | ICD-10-CM | POA: Diagnosis not present

## 2022-01-02 DIAGNOSIS — M86671 Other chronic osteomyelitis, right ankle and foot: Secondary | ICD-10-CM | POA: Diagnosis not present

## 2022-01-02 DIAGNOSIS — L97514 Non-pressure chronic ulcer of other part of right foot with necrosis of bone: Secondary | ICD-10-CM | POA: Diagnosis not present

## 2022-01-02 LAB — GLUCOSE, CAPILLARY
Glucose-Capillary: 176 mg/dL — ABNORMAL HIGH (ref 70–99)
Glucose-Capillary: 93 mg/dL (ref 70–99)

## 2022-01-02 NOTE — Progress Notes (Signed)
CARINNA, NEWHART ([id number]) ?Visit Report for 01/02/2022 ?Arrival Information Details ?Patient Name: Date of Service: ?[name], [name] 01/02/2022 1:00 PM ?Medical Record Number: [id number] ?Patient Account Number: [id number] ?Date of Birth/Sex: Treating RN: ?1940/11/10 (81 y.o. F) [name], [name] ?Primary Care Celines Femia: [name] ?Other Clinician: [name] ?Referring Jerome Viglione: ?Treating Samaiya Awadallah/[name]: [name] ?[name] ?Weeks in Treatment: 28 ?Visit Information History Since Last Visit ?All ordered tests and consults were completed: Yes ?Patient Arrived: [name] ?Added or deleted any medications: No ?Arrival Time: 12:45 ?Any new allergies or adverse reactions: No ?Accompanied By: self ?Had a fall or experienced change in No ?Transfer Assistance: None ?activities of daily living that may affect ?Patient Identification Verified: Yes ?risk of falls: ?Secondary Verification Process Completed: Yes ?Signs or symptoms of abuse/neglect since last visito No ?Patient Requires Transmission-Based Precautions: No ?Hospitalized since last visit: No ?Patient Has Alerts: Yes ?Implantable device outside of the clinic excluding No ?Patient Alerts: Patient on Blood Thinner cellular tissue based products placed in the center ?since last visit: ?Pain Present Now: No ?Electronic Signature(s) ?Signed: 01/02/2022 3:35:14 PM By: [name] CHT EMT BS ?, , ?Entered By: [name] on 01/02/2022 15:35:13 ?-------------------------------------------------------------------------------- ?Encounter Discharge Information Details ?Patient Name: Date of Service: ?[name], [name] 01/02/2022 1:00 PM ?Medical Record Number: [id number] ?Patient Account Number: [id number] ?Date of Birth/Sex: Treating RN: ?[birth year] (81 y.o. F) [name], [name] ?Primary Care Jorian Willhoite: [name] ?Other Clinician: [name] ?Referring Jonita Hirota: ?Treating Aritha Huckeba/[name]: [name] ?[name] ?Weeks in Treatment: 28 ?Encounter Discharge Information Items ?Discharge Condition: Stable ?Ambulatory Status: [name] ?Discharge Destination: Home ?Transportation: Private Auto ?Accompanied By: self ?Schedule Follow-up Appointment: No ?Clinical Summary of Care: ?Electronic Signature(s) ?Signed: 01/02/2022 3:43:02 PM By: [name] CHT EMT BS ?, , ?Entered By: [name] on 01/02/2022 15:43:01 ?-------------------------------------------------------------------------------- ?Vitals Details ?Patient Name: ?Date of Service: ?[name], [name] 01/02/2022 1:00 PM ?Medical Record Number: [id number] ?Patient Account Number: [id number] ?Date of Birth/Sex: ?Treating RN: ?[birth year] (81 y.o. F) [name], [name] ?Primary Care Emalie Mcwethy: [name] ?Other Clinician: [name] ?Referring Shenique Childers: ?Treating Solana Coggin/[name]: [name] ?[name] ?Weeks in Treatment: 28 ?Vital Signs ?Time Taken: 12:58 ?Temperature (??F): 98.0 ?Height (in): 64 ?Pulse (bpm): 74 ?Weight (lbs): 135 ?Respiratory Rate (breaths/min): 16 ?Body Mass Index (BMI): 23.2 ?Blood Pressure (mmHg): 158/62 ?Capillary Blood Glucose (mg/dl): 176 ?Reference Range: 80 - 120 mg / dl ?Airway ?Pulse Oximetry (%): 100 ?Electronic Signature(s) ?Signed: 01/02/2022 3:35:53 PM By: [name] CHT EMT BS ?, , ?Entered By: [name] on 01/02/2022 15:35:53 ?

## 2022-01-02 NOTE — Progress Notes (Signed)
NARAYA, STONEBERG ([id number]) ?Visit Report for 01/02/2022 ?Problem List Details ?Patient Name: Date of Service: ?[name], [name] 01/02/2022 1:00 PM ?Medical Record Number: [id number] ?Patient Account Number: [id number] ?Date of Birth/Sex: Treating RN: ?12-Jan-1941 (81 y.o. 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 06/15/2021 No Yes ?Y30.160 Other chronic osteomyelitis, right ankle and foot 10/25/2021 No Yes ?Inactive Problems ?ICD-10 ?Code Description Active Date Inactive Date ?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 01/02/2022 ?Medical Record Number: [id number] ?Patient Account Number: [id number] ?Date of Birth/Sex: ?Treating RN: ?28-Nov-1940 (81 y.o. 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 ?D22.025 Other chronic osteomyelitis, right ankle and foot ?Facility Procedures ?CPT4 Code: 42706237 ?Description: G0277-(Facility Use Only) HBOT full body chamber, 29mn , ICD-10 Diagnosis Description 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 M86.671 Other  ?chronic osteomyelitis, right ankle and foot T81.31XD Disruption of external operation (surgical) wound, not elsewhere classified, subse ?Modifier: quent encounter ?Quantity: 4 ?Physician Procedures ?: CPT4 Code Description Modifier [id number] 76160- WC PHYS HYPERBARIC OXYGEN THERAPY ICD-10 Diagnosis Description 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

## 2022-01-02 NOTE — Progress Notes (Signed)
[name], [name] ([id number]) ?Visit Report for 01/02/2022 ?HBO Details ?Patient Name: Date of Service: ?[name], [name] 01/02/2022 1:00 PM ?Medical Record Number: [id number] ?Patient Account Number: [id number] ?Date of Birth/Sex: Treating RN: ?Jun 03, 1941 (81 y.o. 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 ?HBO Treatment Details ?Treatment Number: 60 ?Patient Type: Outpatient ?Chamber Type: Monoplace ?Chamber Serial #: [id number] ?Treatment Protocol: 2.0 ATA with 90 minutes oxygen, and no air breaks ?Treatment Details ?Compression Rate Down: 2.0 psi / minute ?De-Compression Rate Up: 2.0 psi / minute ?Air ?breaks ?and ?breathing Decompress Decompress ?Compress Tx Pressure ?Begins Reached periods Begins Ends ?(leave ?unused ?spaces ?blank) ?Chamber Pressure (ATA 1 2 ------2 1 ?) ?Clock Time (24 hr) 13:08 13:17 - - - - - - 14:47 14:56 ?Treatment Length: 108 (minutes) ?Treatment Segments: 4 ?Vital Signs ?Capillary Blood Glucose Reference Range: 80 - 120 mg / dl ?HBO Diabetic Blood Glucose Intervention Range: <131 mg/dl or >249 mg/dl ?Type: Time Vitals Blood Pulse: Respiratory Temperature: Capillary Blood Glucose Pulse Action ?Taken: ?Pressure: ?Rate: ?Glucose (mg/dl): Meter #: Oximetry (%) Taken: ?Pre 12:58 158/62 74 16 98 176 2 100 ?Post 15:00 132/66 72 16 98.1 93 2 100 Patient given 8 oz Glucerna Shake ?Treatment Response ?Treatment Toleration: Well ?Treatment Completion Status:  Treatment Completed without Adverse Event ?Treatment Notes ?Patient's post-treatment glucose level was 93. Patient was given an 8 oz Glucerna to drink. After 15 minutes, per [facility] protocol, patient was asymptomatic ?for hypoglycemia. Patient was discharged. ?Additional Procedure Documentation ?Tissue Sevierity: Fat layer exposed ?Physician HBO Attestation: ?I certify that I supervised this HBO treatment in accordance with Medicare ?guidelines. A trained emergency response team is readily available per Yes ?hospital policies and procedures. ?Continue HBOT as ordered. Yes ?Electronic Signature(s) ?Signed: 01/02/2022 4:11:56 PM By: [name] MD FACS ?Previous Signature: 01/02/2022 3:42:02 PM Version By: [name] CHT EMT BS ?, , ?Entered By: [name] on 01/02/2022 16:11:55 ?-------------------------------------------------------------------------------- ?HBO Safety Checklist Details ?Patient Name: ?Date of Service: ?[name], [name] 01/02/2022 1:00 PM ?Medical Record Number: [id number] ?Patient Account Number: [id number] ?Date of Birth/Sex: ?Treating RN: ?March 21, 1941 (81 y.o. F) [name], [name] ?Primary Care [name]: [name] ?Other Clinician: [name] ?Referring Tonette Koehne: ?Treating [name]/[name]: [name] ?[name] ?Weeks in Treatment: 28 ?HBO Safety Checklist Items ?Safety Checklist ?Consent Form Signed ?Patient voided / foley secured and emptied ?When did you last eato 0830 ?Last dose of injectable or oral agent 0830 ?Ostomy pouch emptied and vented if applicable ?NA ?All implantable devices assessed, documented and approved ?NA ?Intravenous access site secured and place ?NA ?Valuables secured ?Linens and cotton and cotton/polyester blend (less than 51% polyester) ?Personal oil-based products / skin lotions / body lotions removed ?Wigs or hairpieces removed Patient removed item. ?Smoking or tobacco materials removed ?NA ?Books / newspapers / magazines /  loose paper removed ?Cologne, aftershave, perfume and deodorant removed ?Jewelry removed (may wrap wedding band) ?Make-up removed ?Hair care products removed ?Battery operated devices (external) removed ?Heating patches and chemical warmers removed ?Titanium eyewear removed ?NA ?Nail polish cured greater than 10 hours ?Casting material cured greater than 10 hours ?  NA ?Hearing aids removed ?NA ?Loose dentures or partials removed ?NA ?Prosthetics have been removed ?NA ?Patient demonstrates correct use of air break device (if applicable) ?Patient concerns have been addressed ?Patient grounding bracelet on and cord attached to chamber ?Specifics for Inpatients (complete in addition to above) ?Medication sheet sent with patient ?NA ?Intravenous medications needed or due during therapy sent with patient ?NA ?Drainage tubes (e.g. nasogastric tube or chest tube secured and vented) ?NA ?Endotracheal or Tracheotomy tube secured ?NA ?Cuff deflated of air and inflated with saline ?NA ?Airway suctioned ?NA ?Notes ?Paper version used prior to treatment. ?Electronic Signature(s) ?Signed: 01/02/2022 3:37:34 PM By: [name] CHT EMT BS ?, , ?Entered By: [name] on 01/02/2022 15:37:34 ?

## 2022-01-02 NOTE — Progress Notes (Signed)
FERN, CANOVA ([id number]) ?Visit Report for 01/01/2022 ?HBO Details ?Patient Name: Date of Service: ?[name], [name] 01/01/2022 12:30 PM ?Medical Record Number: [id number] ?Patient Account Number: [id number] ?Date of Birth/Sex: Treating RN: ?1940-10-22 (81 y.o. 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 ?HBO Treatment Details ?Treatment Number: 38 ?Patient Type: Outpatient ?Chamber Type: Monoplace ?Chamber Serial #: [id number] ?Treatment Protocol: 2.0 ATA with 90 minutes oxygen, and no air breaks ?Treatment Details ?Compression Rate Down: 2.0 psi / minute ?De-Compression Rate Up: 2.0 psi / minute ?Air ?breaks ?and ?breathing Decompress Decompress ?Compress Tx Pressure ?Begins Reached periods Begins Ends ?(leave ?unused ?spaces ?blank) ?Chamber Pressure (ATA 1 2 ------2 1 ?) ?Clock Time (24 hr) 13:07 13:17 - - - - - - 14:47 14:55 ?Treatment Length: 108 (minutes) ?Treatment Segments: 4 ?Vital Signs ?Capillary Blood Glucose Reference Range: 80 - 120 mg / dl ?HBO Diabetic Blood Glucose Intervention Range: <131 mg/dl or >249 mg/dl ?Type: Time Vitals Blood Respiratory Capillary Blood Glucose Pulse Action ?Pulse: Temperature: ?Taken: ?Pressure: ?Rate: ?Glucose (mg/dl): ?Meter #: Oximetry (%) Taken: ?Pre 13:01 152/54 67 16 97.5 154 2 ?Post 15:00 152/72 71 16 98 69 2 100 Given 8 oz Glucerna ?Treatment Response ?Treatment Toleration: Well ?Treatment Completion Status: Treatment Completed  without Adverse Event ?Treatment Notes ?Post-treatment glucose level was 69 mg/dL. Patient drank an 8 oz Glucerna and watched the rest of her television program (30 min) and stated she felt good. ?Patient was asymptomatic for hypoglycemia prior to departure. ?Additional Procedure Documentation ?Tissue Sevierity: Fat layer exposed ?Physician HBO Attestation: ?I certify that I supervised this HBO treatment in accordance with Medicare ?guidelines. A trained emergency response team is readily available per Yes ?hospital policies and procedures. ?Continue HBOT as ordered. Yes ?Electronic Signature(s) ?Signed: 01/01/2022 6:08:55 PM By: [name] CHT EMT BS ?, , ?Signed: 01/02/2022 12:21:41 PM By: [name] MD FACS ?Previous Signature: 01/01/2022 4:58:47 PM Version By: [name] MD FACS ?Entered By: [name] on 01/01/2022 18:08:55 ?-------------------------------------------------------------------------------- ?HBO Safety Checklist Details ?Patient Name: ?Date of Service: ?[name], [name] 01/01/2022 12:30 PM ?Medical Record Number: [id number] ?Patient Account Number: [id number] ?Date of Birth/Sex: ?Treating RN: ?Mar 18, 1941 (81 y.o. F) [name], [name] ?Primary Care Lylah Lantis: [name] ?Other Clinician: [name] ?Referring Jp Eastham: ?Treating Jace Fermin/[name]: [name] ?[name] ?Weeks in Treatment: 28 ?HBO Safety Checklist Items ?Safety Checklist ?Consent Form Signed ?Patient voided / foley secured and emptied ?When did you last eato 0830 ?Last dose of injectable or oral agent 0830 ?Ostomy pouch emptied and vented if applicable ?NA ?All implantable devices assessed, documented and approved ?NA ?Intravenous access site secured and place ?NA ?Valuables secured ?Linens and cotton and cotton/polyester blend (less than 51% polyester) ?Personal oil-based products / skin lotions / body lotions removed ?Wigs or hairpieces removed Patient removed item. ?Smoking or  tobacco materials removed ?NA ?Books / newspapers / magazines / loose paper removed ?Cologne, aftershave, perfume and deodorant removed ?Jewelry removed (may wrap wedding band) ?Make-up removed ?Hair care products removed ?Battery operated devices (external) removed ?Heating patches and chemical warmers removed ?Titanium eyewear removed ?NA ?Nail polish  cured greater than 10 hours 12/30/2021 ?Casting material cured greater than 10 hours ?NA ?Hearing aids removed ?NA ?Loose dentures or partials removed ?NA ?Prosthetics have been removed ?NA ?Patient demonstrates correct use of air break device (if applicable) ?Patient concerns have been addressed ?Patient grounding bracelet on and cord attached to chamber ?Specifics for Inpatients (complete in addition to above) ?Medication sheet sent with patient ?NA ?Intravenous medications needed or due during therapy sent with patient ?NA ?Drainage tubes (e.g. nasogastric tube or chest tube secured and vented) ?NA ?Endotracheal or Tracheotomy tube secured ?NA ?Cuff deflated of air and inflated with saline ?NA ?Airway suctioned ?NA ?Notes ?Paper version used prior to treatment. ?Electronic Signature(s) ?Signed: 01/01/2022 6:44:01 PM By: [name] CHT EMT BS ?, , ?Entered By: [name] on 01/01/2022 15:12:29 ?

## 2022-01-03 ENCOUNTER — Encounter (HOSPITAL_BASED_OUTPATIENT_CLINIC_OR_DEPARTMENT_OTHER): Payer: Medicare Other | Admitting: General Surgery

## 2022-01-03 DIAGNOSIS — T8131XD Disruption of external operation (surgical) wound, not elsewhere classified, subsequent encounter: Secondary | ICD-10-CM | POA: Diagnosis not present

## 2022-01-03 DIAGNOSIS — L97522 Non-pressure chronic ulcer of other part of left foot with fat layer exposed: Secondary | ICD-10-CM | POA: Diagnosis not present

## 2022-01-03 DIAGNOSIS — L97512 Non-pressure chronic ulcer of other part of right foot with fat layer exposed: Secondary | ICD-10-CM | POA: Diagnosis not present

## 2022-01-03 DIAGNOSIS — E11621 Type 2 diabetes mellitus with foot ulcer: Secondary | ICD-10-CM | POA: Diagnosis not present

## 2022-01-03 DIAGNOSIS — L97514 Non-pressure chronic ulcer of other part of right foot with necrosis of bone: Secondary | ICD-10-CM | POA: Diagnosis not present

## 2022-01-03 DIAGNOSIS — E1151 Type 2 diabetes mellitus with diabetic peripheral angiopathy without gangrene: Secondary | ICD-10-CM | POA: Diagnosis not present

## 2022-01-03 DIAGNOSIS — L97511 Non-pressure chronic ulcer of other part of right foot limited to breakdown of skin: Secondary | ICD-10-CM | POA: Diagnosis not present

## 2022-01-03 DIAGNOSIS — M86671 Other chronic osteomyelitis, right ankle and foot: Secondary | ICD-10-CM | POA: Diagnosis not present

## 2022-01-03 LAB — GLUCOSE, CAPILLARY
Glucose-Capillary: 172 mg/dL — ABNORMAL HIGH (ref 70–99)
Glucose-Capillary: 93 mg/dL (ref 70–99)

## 2022-01-03 NOTE — Progress Notes (Signed)
HAYDEE, JABBOUR ([id number]) ?Visit Report for 01/03/2022 ?Problem List Details ?Patient Name: Date of Service: ?[name], [name] LLY H. 01/03/2022 1:00 PM ?Medical Record Number: [id number] ?Patient Account Number: [id number] ?Date of Birth/Sex: Treating RN: ?July 03, 1941 (81 y.o. 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 06/15/2021 No Yes ?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, right ankle and foot 10/25/2021 No Yes ?Inactive Problems ?ICD-10 ?Code Description Active Date Inactive Date ?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 01/03/2022 ?Medical Record Number: [id number] ?Patient Account Number: [id number] ?Date of Birth/Sex: ?Treating RN: ?January 14, 1941 (81 y.o. 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, right ankle and foot ?Facility Procedures ?CPT4 Code: 93790240 ?Description: G0277-(Facility Use Only) HBOT full body chamber, 49mn , ICD-10 Diagnosis Description 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 M86.671 Other  ?chronic osteomyelitis, right ankle and foot T81.31XD Disruption of external operation (surgical) wound, not elsewhere classified, subse ?Modifier: quent encounter ?Quantity: 4 ?Physician Procedures ?: CPT4 Code Description Modifier [id number] WC PHYS HYPERBARIC OXYGEN THERAPY ICD-10 Diagnosis Description 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

## 2022-01-03 NOTE — Progress Notes (Addendum)
[name], [name] ([id number]) ?Visit Report for 01/03/2022 ?HBO Details ?Patient Name: Date of Service: ?[name], [name] LLY H. 01/03/2022 1:00 PM ?Medical Record Number: [id number] ?Patient Account Number: [id number] ?Date of Birth/Sex: Treating RN: ?Aug 05, 1941 (81 y.o. 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 ?HBO Treatment Details ?Treatment Number: 40 ?Patient Type: Outpatient ?Chamber Type: Monoplace ?Chamber Serial #: [id number] ?Treatment Protocol: 2.0 ATA with 90 minutes oxygen, and no air breaks ?Treatment Details ?Compression Rate Down: 2.0 psi / minute ?De-Compression Rate Up: 2.0 psi / minute ?Air ?breaks ?and ?breathing Decompress Decompress ?Compress Tx Pressure ?Begins Reached periods Begins Ends ?(leave ?unused ?spaces ?blank) ?Chamber Pressure (ATA 1 2 ------2 1 ?) ?Clock Time (24 hr) 13:01 13:08 - - - - - - 14:39 14:47 ?Treatment Length: 106 (minutes) ?Treatment Segments: 4 ?Vital Signs ?Capillary Blood Glucose Reference Range: 80 - 120 mg / dl ?HBO Diabetic Blood Glucose Intervention Range: <131 mg/dl or >249 mg/dl ?Type: Time Vitals Blood Pulse: Respiratory Capillary Blood Glucose Pulse Action ?Temperature: ?Taken: ?Pressure: ?Rate: ?Glucose (mg/dl): ?Meter #: Oximetry (%) Taken: ?Pre 12:56 158/60 80 14 98 172 2 100 ?Post 14:58 138/58 72 14 97.7 93 2 100 8 oz Glucerna Shake given ?Treatment Response ?Treatment Toleration: Well ?Treatment Completion Status: Treatment  Completed without Adverse Event ?Treatment Notes ?Patient's post-treatment glucose level was 93. Patient was given an 8 oz Glucerna to drink. Patient has wound care appointment after treatment. ?Additional Procedure Documentation ?Tissue Sevierity: Fat layer exposed ?Physician HBO Attestation: ?I certify that I supervised this HBO treatment in accordance with Medicare ?guidelines. A trained emergency response team is readily available per Yes ?hospital policies and procedures. ?Continue HBOT as ordered. Yes ?Electronic Signature(s) ?Signed: 01/03/2022 4:04:01 PM By: [name] MD FACS ?Entered By: [name] on 01/03/2022 16:04:01 ?-------------------------------------------------------------------------------- ?HBO Safety Checklist Details ?Patient Name: ?Date of Service: ?[name], [name] LLY H. 01/03/2022 1:00 PM ?Medical Record Number: [id number] ?Patient Account Number: [id number] ?Date of Birth/Sex: ?Treating RN: ?06/11/1941 (81 y.o. F) Boehlein, [name] ?Primary Care Lucelia Lacey: [name] ?Other Clinician: [name] ?Referring Verdie Wilms: ?Treating Johnathan Heskett/[name]: [name] ?[name] ?Weeks in Treatment: 28 ?HBO Safety Checklist Items ?Safety Checklist ?Consent Form Signed ?Patient voided / foley secured and emptied ?When did you last eato 0845 ?Last dose of injectable or oral agent 0845 ?Ostomy pouch emptied and vented if applicable ?NA ?All implantable devices assessed, documented and approved ?NA ?Intravenous access site secured and place ?NA ?Valuables secured ?Linens and cotton and cotton/polyester blend (less than 51% polyester) ?Personal oil-based products / skin lotions / body lotions removed ?Wigs or hairpieces removed ?Smoking or tobacco materials removed ?NA ?Books / newspapers / magazines / loose paper removed ?Cologne, aftershave, perfume and deodorant removed ?Jewelry removed (may wrap wedding band) ?Make-up removed ?Hair care products removed ?Battery operated  devices (external) removed ?Heating patches and chemical warmers removed ?Titanium eyewear removed ?NA ?Nail polish cured greater than 10 hours ?Casting material cured greater than 10 hours ?NA ?Hearing aids removed ?NA ?Loose dentures or partials removed ?NA ?Prosthetics have been removed ?NA ?Patient demonstrates correct use of air break device (if  applicable) ?Patient concerns have been addressed ?Patient grounding bracelet on and cord attached to chamber ?Specifics for Inpatients (complete in addition to above) ?Medication sheet sent with patient ?NA ?Intravenous medications needed or due during therapy sent with patient ?NA ?Drainage tubes (e.g. nasogastric tube or chest tube secured and vented) ?NA ?Endotracheal or Tracheotomy tube secured ?NA ?Cuff deflated of air and inflated with saline ?NA ?Airway suctioned ?NA ?Notes ?Paper version used prior to treatment. ?Electronic Signature(s) ?Signed: 01/03/2022 1:58:45 PM By: [name] CHT EMT BS ?, , ?Entered By: [name] on 01/03/2022 13:47:05 ?

## 2022-01-03 NOTE — Progress Notes (Addendum)
OCTOBER, PEERY ([id number]) ?Visit Report for 01/03/2022 ?Arrival Information Details ?Patient Name: Date of Service: ?[name], [name] LLY H. 01/03/2022 1:00 PM ?Medical Record Number: [id number] ?Patient Account Number: [id number] ?Date of Birth/Sex: Treating RN: ?October 27, 1940 (81 y.o. F) Boehlein, [name] ?Primary Care Lainy Wrobleski: [name] ?Other Clinician: [name] ?Referring Kinberly Perris: ?Treating Michelina Mexicano/[name]: [name] ?[name] ?Weeks in Treatment: 28 ?Visit Information History Since Last Visit ?All ordered tests and consults were completed: Yes ?Patient Arrived: [name] ?Added or deleted any medications: No ?Arrival Time: 12:28 ?Any new allergies or adverse reactions: No ?Accompanied By: self ?Had a fall or experienced change in No ?Transfer Assistance: None ?activities of daily living that may affect ?Patient Identification Verified: Yes ?risk of falls: ?Secondary Verification Process Completed: Yes ?Signs or symptoms of abuse/neglect since last visito No ?Patient Requires Transmission-Based Precautions: No ?Hospitalized since last visit: No ?Patient Has Alerts: Yes ?Implantable device outside of the clinic excluding No ?Patient Alerts: Patient on Blood Thinner cellular tissue based products placed in the center ?since last visit: ?Pain Present Now: No ?Electronic Signature(s) ?Signed: 01/03/2022 1:58:45 PM By: [name] CHT EMT BS ?, , ?Entered By: [name] on 01/03/2022 13:44:59 ?-------------------------------------------------------------------------------- ?Encounter Discharge Information Details ?Patient Name: Date of Service: ?[name], [name] LLY H. 01/03/2022 1:00 PM ?Medical Record Number: [id number] ?Patient Account Number: [id number] ?Date of Birth/Sex: Treating RN: ?Apr 01, 1941 (81 y.o. F) Boehlein, [name] ?Primary Care Ethelda Deangelo: [name] ?Other Clinician: [name] ?Referring Rease Wence: ?Treating Urban Naval/[name]: [name] ?[name] ?Weeks in Treatment: 28 ?Encounter Discharge Information Items ?Discharge Condition: Stable ?Ambulatory Status: [name] ?Discharge Destination: Other (Note Required) ?Accompanied By: staff ?Schedule Follow-up Appointment: No ?Clinical Summary of Care: ?Notes ?Patient has a wound care appointment after HBO ?Electronic Signature(s) ?Signed: 01/05/2022 2:13:00 PM By: [name] CHT EMT BS ?, , ?Entered By: [name] on 01/03/2022 16:03:29 ?-------------------------------------------------------------------------------- ?Vitals Details ?Patient Name: ?Date of Service: ?[name], [name] LLY H. 01/03/2022 1:00 PM ?Medical Record Number: [id number] ?Patient Account Number: [id number] ?Date of Birth/Sex: ?Treating RN: ?07-23-1941 (81 y.o. F) Boehlein, [name] ?Primary Care Aeron Donaghey: [name] ?Other Clinician: [name] ?Referring Laney Bagshaw: ?Treating Tarshia Kot/[name]: [name] ?[name] ?Weeks in Treatment: 28 ?Vital Signs ?Time Taken: 12:56 ?Temperature (??F): 98.0 ?Height (in): 64 ?Pulse (bpm): 80 ?Weight (lbs): 135 ?Respiratory Rate (breaths/min): 14 ?Body Mass Index (BMI): 23.2 ?Blood Pressure (mmHg): 158/60 ?Capillary Blood Glucose (mg/dl): 172 ?Reference Range: 80 - 120 mg / dl ?Airway ?Pulse Oximetry (%): 100 ?Electronic Signature(s) ?Signed: 01/03/2022 1:58:45 PM By: [name] CHT EMT BS ?, , ?Entered By: [name] on 01/03/2022 13:45:34 ?

## 2022-01-04 ENCOUNTER — Encounter (HOSPITAL_BASED_OUTPATIENT_CLINIC_OR_DEPARTMENT_OTHER): Payer: Medicare Other | Admitting: General Surgery

## 2022-01-04 DIAGNOSIS — E1151 Type 2 diabetes mellitus with diabetic peripheral angiopathy without gangrene: Secondary | ICD-10-CM | POA: Diagnosis not present

## 2022-01-04 DIAGNOSIS — M86671 Other chronic osteomyelitis, right ankle and foot: Secondary | ICD-10-CM | POA: Diagnosis not present

## 2022-01-04 DIAGNOSIS — T8131XD Disruption of external operation (surgical) wound, not elsewhere classified, subsequent encounter: Secondary | ICD-10-CM | POA: Diagnosis not present

## 2022-01-04 DIAGNOSIS — L97514 Non-pressure chronic ulcer of other part of right foot with necrosis of bone: Secondary | ICD-10-CM | POA: Diagnosis not present

## 2022-01-04 DIAGNOSIS — L97512 Non-pressure chronic ulcer of other part of right foot with fat layer exposed: Secondary | ICD-10-CM | POA: Diagnosis not present

## 2022-01-04 DIAGNOSIS — E11621 Type 2 diabetes mellitus with foot ulcer: Secondary | ICD-10-CM | POA: Diagnosis not present

## 2022-01-04 LAB — GLUCOSE, CAPILLARY
Glucose-Capillary: 145 mg/dL — ABNORMAL HIGH (ref 70–99)
Glucose-Capillary: 73 mg/dL (ref 70–99)

## 2022-01-04 NOTE — Progress Notes (Signed)
ZALEY, TALLEY ([id number]) ?Visit Report for 01/03/2022 ?Chief Complaint Document Details ?Patient Name: Date of Service: ?[name], [name] 01/03/2022 3:00 PM ?Medical Record Number: [id number] ?Patient Account Number: [id number] ?Date of Birth/Sex: Treating RN: ?10-09-1940 (81 y.o. F) ?Primary Care Provider: [name] Other Clinician: ?Referring Provider: ?Treating Provider/[name]: [name] ?[name] ?Weeks in Treatment: 28 ?Information Obtained from: Patient ?Chief Complaint ?07/13/2019; patient is here for review wounds on the toes of her right foot and the left fourth toe ?08/04/2020; patient is here for review of wounds at the anterior part of her right first met head/TMA site and another area on the right medial calf at a vein ?harvest site. ?Electronic Signature(s) ?Signed: 01/03/2022 3:52:27 PM By: [name] MD FACS ?Entered By: [name] on 01/03/2022 15:52:27 ?-------------------------------------------------------------------------------- ?Debridement Details ?Patient Name: Date of Service: ?[name], [name] 01/03/2022 3:00 PM ?Medical Record Number: [id number] ?Patient Account Number: [id number] ?Date of Birth/Sex: Treating RN: ?18-Mar-1941 (81 y.o. 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 ?Performed By: Physician [name], MD ?Debridement Type: Debridement ?Severity of Tissue Pre Debridement: Fat layer exposed ?Level of Consciousness (Pre-procedure): Awake and Alert ?Pre-procedure Verification/Time Out Yes - 15:45 ?Taken: ?Start Time: 15:45 ?Pain Control: Lidocaine 4% T opical Solution ?T Area Debrided (L x W): ?otal 0.1 (cm) x 0.1 (cm) = 0.01 (cm?) ?Tissue and other material debrided: Non-Viable, Eschar ?Level: Non-Viable  Tissue ?Debridement Description: Selective/Open Wound ?Instrument: Curette ?Bleeding: None ?Procedural Pain: 0 ?Post Procedural Pain: 0 ?Response to Treatment: Procedure was tolerated well ?Level of Consciousness (Post- Awake and Alert ?procedure): ?Post Debridement Measurements of Total Wound ?Length: (cm) 0.1 ?Width: (cm) 0.1 ?Depth: (cm) 0.1 ?Volume: (cm?) 0.001 ?Character of Wound/Ulcer Post Debridement: Improved ?Severity of Tissue Post Debridement: Fat layer exposed ?Post Procedure Diagnosis ?Same as Pre-procedure ?Electronic Signature(s) ?Signed: 01/03/2022 4:03:20 PM By: [name] MD FACS ?Signed: 01/04/2022 5:23:26 PM By: Sharyn Creamer RN, BSN ?Entered By: Sharyn Creamer on 01/03/2022 15:48:20 ?-------------------------------------------------------------------------------- ?Debridement Details ?Patient Name: ?Date of Service: ?[name], [name] 01/03/2022 3:00 PM ?Medical Record Number: [id number] ?Patient Account Number: [id number] ?Date of Birth/Sex: ?Treating RN: ?07-09-1941 (81 y.o. F) Sharyn Creamer ?Primary Care Provider: [name] ?Other Clinician: ?Referring Provider: ?Treating Provider/[name]: [name] ?[name] ?Weeks in Treatment: 28 ?Debridement Performed for Assessment: 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 ?Performed By: Physician [name], MD ?Debridement Type: Debridement ?Severity of Tissue Pre Debridement: Limited to breakdown of skin ?Level of Consciousness (Pre-procedure): Awake and Alert ?Pre-procedure Verification/Time Out Yes - 15:45 ?Taken: ?Start Time: 15:45 ?Pain Control: Lidocaine 4% T opical Solution ?T Area Debrided (L x W): ?otal 0.2 (cm) x 0.2 (cm) = 0.04 (cm?) ?Tissue and other material debrided: Non-Viable, Eschar ?Level: Non-Viable Tissue ?Debridement Description: Selective/Open Wound ?Instrument: Curette ?Bleeding: None ?Procedural Pain: 0 ?Post Procedural Pain: 0 ?Response to Treatment: Procedure was tolerated  well ?Level of Consciousness (Post- Awake and Alert ?procedure): ?Post Debridement Measurements of Total Wound ?Length: (cm) 0 ?Width: (cm) 0 ?Depth: (cm) 0 ?Volume: (cm?) 0 ?Character of Wound/Ulcer Post Debridement: Improved ?Severity of Tissue Post Debridement: Limited to breakdown of skin ?Post Procedure Diagnosis ?Same as Pre-procedure ?Electronic Signature(s) ?Signed: 01/03/2022 4:03:20 PM By: [name] MD FACS ?Signed: 01/04/2022 5:23:26 PM By: Sharyn Creamer RN, BSN ?Entered By: Sharyn Creamer on 01/03/2022 15:50:49 ?-------------------------------------------------------------------------------- ?HPI Details ?Patient Name: ?Date  of Service: ?[name], [name] 01/03/2022 3:00 PM ?Medical Record Number: [id number] ?Patient Account Number: [id number] ?Date of Birth/Sex: Treating RN: ?November 07, 1940 (81 y.o. F) ?Primary Care Provider: [name] Other Clinician: ?Referring Provider: ?Treating Provider/[name]: [name] ?[name] ?Weeks in Treatment: 28 ?History of Present Illness ?HPI Description: ADMISSION ?07/13/2019 ?Patient is a 81 year old type II diabetic. She has known PAD. She has been followed by Dr. Jacqualyn Posey of podiatry for blistering areas on her toes dating back to ?04/30/2019 which at this was the left fourth toe. By 8/11 she had wounds on the right and left second toes. She underwent arterial studies by Dr. Alvester Chou on 7/31 ?that showed ABIs on the right of 0.60 TBI of 0.26 on the left ABI of 0.56 and a TBI of 0.25. By 9/10 she had ischemic-looking wounds per Dr. Earleen Newport on the ?right first, left first second and third. She has been using Medihoney and then mupirocin more recently simply Betadine. ?The patient underwent angiography by Dr. Gwenlyn Found on 9/21. On the right this showed 90 to 95% calcified distal right common femoral artery stenosis and a 95% ?focal mid SFA stenosis followed by an 80% segmental stenosis. Noted that there was 1 vessel runoff in the foot via the  peroneal. ?On the left there was an 80% left SFA, 70% mid left SFA. There was a short segment calcified CTO distal less than SFA and above-knee popliteal artery ?reconstituting with two-vessel runoff. 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 It was felt that she had bilateral SFA disease as well as tibial vessel disease. An ?attempt was made to revascularize the left SFA but they were unable to cross because of the highly calcified nature of the lesion. 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 She has an area on the medial left fourth toe with raised horned callus on top of this. I am not certain what this represents. With regards to pain she has ?about a 2-1/2 I will claudication tolerance in the grocery store. She has some pain in night which is relieved by putting her feet down over the side of the bed. ?She is wearing Nitro-Dur patches on the top of her right foot. ?Past medical history includes type 2 diabetes with secondary PAD, neoplasm of the skin, diabetic retinopathy, carotid artery stenosis, hyperlipidemia ?hypertension. ?11/2; the last time the patient was here I spoke to Dr. Gwenlyn Found about revascularization options on the right. As I understand things currently Dr. Gwenlyn Found spoke with ?Dr. Trula Slade and ultimately the patient was taken to surgery on 10/27. She had a right iliofemoral endarterectomy with a bovine patch angioplasty. I think the ?plan now is for her to have a staged intervention on the right SFA by Dr. Alvester Chou. Per the patient's understanding Dr. Gwenlyn Found and Dr. Jacqualyn Posey are waiting to see ?when the gangrenous toes on the right foot can be amputated. ?The patient states her pain is a lot better and she is grateful for that. 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 Small area ?on the left fourth toe. She is using Betadine to the  mummified areas on the right and Medihoney on the left ?08/27/2019; the last time I saw this patient I discharged her from the clinic. She had been revascularized by Dr. Trula Slade and she had a right iliofemoral ?endarterectomy wit

## 2022-01-04 NOTE — Progress Notes (Signed)
CHRISTEL, BAI ([id number]) ?Visit Report for 01/04/2022 ?Problem List Details ?Patient Name: Date of Service: ?[name], [name] 01/04/2022 1:00 PM ?Medical Record Number: [id number] ?Patient Account Number: [id number] ?Date of Birth/Sex: Treating RN: ?12/21/1940 (81 y.o. F) [name], [name] ?Primary Care Provider: [name] ?Other Clinician: [name] ?Referring Provider: ?Treating Provider/[name]: [name] ?[name] ?Weeks in Treatment: 29 ?Active Problems ?ICD-10 ?Encounter ?Code Description Active Date MDM ?Diagnosis ?E11.621 Type 2 diabetes mellitus with foot ulcer 06/15/2021 No Yes ?T81.31XD Disruption of external operation (surgical) wound, not elsewhere classified, 06/15/2021 No Yes ?subsequent encounter ?L97.514 Non-pressure chronic ulcer of other part of right foot with necrosis of bone 09/20/2021 No Yes ?E11.51 Type 2 diabetes mellitus with diabetic peripheral angiopathy without gangrene 06/15/2021 No Yes ?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, right ankle and foot 10/25/2021 No Yes ?Inactive Problems ?ICD-10 ?Code Description Active Date Inactive Date ?L97.428 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 06/15/2021 06/15/2021 ?L97.528 Non-pressure chronic ulcer of other part of left foot with other specified severity 06/15/2021 06/15/2021 ?Resolved Problems ?Electronic Signature(s) ?Signed: 01/04/2022 4:40:32 PM By: [name] MD FACS ?Entered By: [name] on 01/04/2022 16:40:32 ?-------------------------------------------------------------------------------- ?SuperBill Details ?Patient Name: ?Date of Service: ?[name], [name] 01/04/2022 ?Medical Record Number: [id number] ?Patient Account Number: [id number] ?Date of Birth/Sex: ?Treating RN: ?1940-10-26 (81 y.o. F) [name], [name] ?Primary Care Provider: [name] ?Other Clinician: [name] ?Referring Provider: ?Treating Provider/[name]: [name] ?[name] ?Weeks in Treatment: 29 ?Diagnosis Coding ?ICD-10 Codes ?Code Description ?E11.621 Type 2 diabetes mellitus with foot ulcer ?T81.31XD Disruption of external operation (surgical) wound, not elsewhere classified, subsequent encounter ?L97.514 Non-pressure chronic ulcer of other part of right foot with necrosis of bone ?E11.51 Type 2 diabetes mellitus with diabetic peripheral angiopathy without gangrene ?F47.340 Other chronic osteomyelitis, right ankle and foot ?Facility Procedures ?CPT4 Code: 37096438 ?Description: G0277-(Facility Use Only) HBOT full body chamber, 5mn , ICD-10 Diagnosis Description 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 T81.31XD Disruption  ?of external operation (surgical) wound, not elsewhere classified, subse 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, right ankle and foot ?Modifier: quent encounter ?Quantity: 4 ?Physician Procedures ?: CPT4 Code Description Modifier [id number] PHYS HYPERBARIC OXYGEN THERAPY ICD-10 Diagnosis Description 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 T81.31XD ? Disruption of external operation (surgical) wound, not elsewhere classified, subsequent encounter 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, right ankle and foot ?Quantity: 1 ?Electronic Signature(s) ?Signed: 01/04/2022 4:40:24 PM By: CFredirick MaudlinMD FACS ?Entered By: CFredirick Maudlinon 01/04/2022 16:40:23 ?

## 2022-01-05 ENCOUNTER — Ambulatory Visit (INDEPENDENT_AMBULATORY_CARE_PROVIDER_SITE_OTHER): Payer: Medicare Other | Admitting: Internal Medicine

## 2022-01-05 ENCOUNTER — Encounter: Payer: Self-pay | Admitting: Internal Medicine

## 2022-01-05 ENCOUNTER — Other Ambulatory Visit: Payer: Self-pay

## 2022-01-05 VITALS — BP 163/69 | HR 74 | Temp 98.3°F | Wt 148.0 lb

## 2022-01-05 DIAGNOSIS — M869 Osteomyelitis, unspecified: Secondary | ICD-10-CM | POA: Diagnosis not present

## 2022-01-05 DIAGNOSIS — I6523 Occlusion and stenosis of bilateral carotid arteries: Secondary | ICD-10-CM | POA: Diagnosis not present

## 2022-01-05 DIAGNOSIS — E1169 Type 2 diabetes mellitus with other specified complication: Secondary | ICD-10-CM | POA: Diagnosis not present

## 2022-01-05 DIAGNOSIS — E11621 Type 2 diabetes mellitus with foot ulcer: Secondary | ICD-10-CM | POA: Diagnosis not present

## 2022-01-05 DIAGNOSIS — L97509 Non-pressure chronic ulcer of other part of unspecified foot with unspecified severity: Secondary | ICD-10-CM

## 2022-01-05 NOTE — Progress Notes (Signed)
[name], [name] ([id number]) ?Visit Report for 01/04/2022 ?HBO Details ?Patient Name: Date of Service: ?[name], [name] 01/04/2022 1:00 PM ?Medical Record Number: [id number] ?Patient Account Number: [id number] ?Date of Birth/Sex: Treating RN: ?[birth year] (81 y.o. F) [name], [name] ?Primary Care Brandilee Pies: [name] ?Other Clinician: [name] ?Referring Oluwaseun Cremer: ?Treating Kapena Hamme/[name]: [name] ?[name] ?Weeks in Treatment: 29 ?HBO Treatment Course Details ?Treatment Course Number: 2 ?Ordering Connar Keating: [name] ?T Treatments Ordered: ?otal 50 HBO Treatment Start Date: 11/08/2021 ?HBO Indication: ?Diabetic Ulcer(s) of the Lower Extremity ?Notes: Extended 2/29/2023 ?Standard/Conservative Wound Care tried and failed greater than or equal to ?30 days ?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 ?HBO Treatment Details ?Treatment Number: 44 ?Patient Type: Outpatient ?Chamber Type: Monoplace ?Chamber Serial #: [id number] ?Treatment Protocol: 2.0 ATA with 90 minutes oxygen, and no air breaks ?Treatment Details ?Compression Rate Down: 2.0 psi / minute ?De-Compression Rate Up: 2.0 psi / minute ?Air ?breaks ?and ?breathing Decompress Decompress ?Compress Tx Pressure ?Begins Reached periods Begins Ends ?(leave ?unused ?spaces ?blank) ?Chamber Pressure (ATA 1 2 ------2 1 ?) ?Clock Time (24 hr) 13:33 13:41 - - - - - - 15:11 15:19 ?Treatment Length: 106 (minutes) ?Treatment Segments: 4 ?Vital Signs ?Capillary Blood Glucose Reference Range: 80 - 120 mg / dl ?HBO Diabetic Blood Glucose Intervention Range: <131 mg/dl or >249 mg/dl ?Type: Time Vitals Blood Pulse: Respiratory Temperature: Capillary Blood Glucose Pulse Action ?Taken: Pressure: Rate: Glucose (mg/dl): Meter #: Oximetry (%) Taken: ?Pre 13:25 170/52 67 16 97.9 145 2 ?Post 15:23 182/64 73 16 98.3 73 2 BP reported to physician. Given 8 oz Glucerna ?Treatment Response ?Treatment Toleration: Well ?Treatment Completion Status:  Treatment Completed without Adverse Event ?Treatment Notes ?Patient's post-treatment glucose level was 73. Patient was given an 8 oz Glucerna to drink. At 1548 patient stated she feels fine. Patient was asymptomatic for ?hypoglycemia. Patient's systolic BP was high per protocol and was reported to physician. Patient was asymptomatic for hypertension. ?Additional Procedure Documentation ?Tissue Sevierity: Fat layer exposed ?Physician HBO Attestation: ?I certify that I supervised this HBO treatment in accordance with Medicare ?guidelines. A trained emergency response team is readily available per Yes ?hospital policies and procedures. ?Continue HBOT as ordered. Yes ?Electronic Signature(s) ?Signed: 01/04/2022 4:40:14 PM By: [name] MD FACS ?Entered By: [name] on 01/04/2022 16:40:13 ?-------------------------------------------------------------------------------- ?HBO Safety Checklist Details ?Patient Name: ?Date of Service: ?[name], [name] 01/04/2022 1:00 PM ?Medical Record Number: [id number] ?Patient Account Number: [id number] ?Date of Birth/Sex: ?Treating RN: ?06-26-1941 (81 y.o. F) [name], [name] ?Primary Care Frandy Basnett: [name] ?Other Clinician: [name] ?Referring Marcelline Temkin: ?Treating Eliel Dudding/[name]: [name] ?[name] ?Weeks in Treatment: 29 ?HBO Safety Checklist Items ?Safety Checklist ?Consent Form Signed ?Patient voided / foley secured and emptied ?When did you last eato 0830 ?Last dose of injectable or oral agent 0830 ?Ostomy pouch emptied and vented if applicable ?NA ?All implantable devices assessed, documented and approved ?NA ?Intravenous access site secured and place ?NA ?Valuables secured ?Linens and cotton and cotton/polyester blend (less than 51% polyester) ?Personal oil-based products / skin lotions / body lotions removed ?Wigs or hairpieces removed ?NA ?Smoking or tobacco materials removed ?NA ?Books / newspapers / magazines / loose paper  removed ?Cologne, aftershave, perfume and deodorant removed ?Jewelry removed (may wrap wedding band) ?Make-up removed ?Hair care products removed ?Battery operated devices (external) removed ?Heating patches and chemical warmers removed ?Titanium eyewear removed ?NA ?Nail polish cured greater than 10 hours ?Casting material cured greater than 10 hours ?NA ?  Hearing aids removed ?NA ?Loose dentures or partials removed ?NA ?Prosthetics have been removed ?NA ?Patient demonstrates correct use of air break device (if applicable) ?Patient concerns have been addressed ?Patient grounding bracelet on and cord attached to chamber ?Specifics for Inpatients (complete in addition to above) ?Medication sheet sent with patient ?NA ?Intravenous medications needed or due during therapy sent with patient ?NA ?Drainage tubes (e.g. nasogastric tube or chest tube secured and vented) ?NA ?Endotracheal or Tracheotomy tube secured ?NA ?Cuff deflated of air and inflated with saline ?NA ?Airway suctioned ?NA ?Notes ?Paper version used prior to treatment. ?Electronic Signature(s) ?Signed: 01/05/2022 2:13:00 PM By: [name] CHT EMT BS ?, , ?Entered By: [name] on 01/04/2022 14:35:58 ?

## 2022-01-05 NOTE — Progress Notes (Signed)
[name], [name] ([id number]) ?Visit Report for 01/04/2022 ?Arrival Information Details ?Patient Name: Date of Service: ?[name], [name] 01/04/2022 1:00 PM ?Medical Record Number: [id number] ?Patient Account Number: [id number] ?Date of Birth/Sex: Treating RN: ?08-23-1941 (81 y.o. F) [name], [name] ?Primary Care Christyne Mccain: [name] ?Other Clinician: [name] ?Referring Mirella Gueye: ?Treating Amri Lien/[name]: [name] ?[name] ?Weeks in Treatment: 29 ?Visit Information History Since Last Visit ?All ordered tests and consults were completed: Yes ?Patient Arrived: [name] ?Added or deleted any medications: No ?Arrival Time: 13:12 ?Any new allergies or adverse reactions: No ?Accompanied By: self ?Had a fall or experienced change in No ?Transfer Assistance: None ?activities of daily living that may affect ?Patient Identification Verified: Yes ?risk of falls: ?Secondary Verification Process Completed: Yes ?Signs or symptoms of abuse/neglect since last visito No ?Patient Requires Transmission-Based Precautions: No ?Hospitalized since last visit: No ?Patient Has Alerts: Yes ?Implantable device outside of the clinic excluding No ?Patient Alerts: Patient on Blood Thinner cellular tissue based products placed in the center ?since last visit: ?Pain Present Now: No ?Electronic Signature(s) ?Signed: 01/05/2022 2:13:00 PM By: [name] CHT EMT BS ?, , ?Entered By: [name] on 01/04/2022 14:29:27 ?-------------------------------------------------------------------------------- ?Encounter Discharge Information Details ?Patient Name: Date of Service: ?[name], [name] 01/04/2022 1:00 PM ?Medical Record Number: [id number] ?Patient Account Number: [id number] ?Date of Birth/Sex: Treating RN: ?[birth year] (81 y.o. F) [name], [name] ?Primary Care Idelia Caudell: [name] ?Other Clinician: [name] ?Referring Culley Hedeen: ?Treating Dakota Stangl/[name]: [name] ?[name] ?Weeks in Treatment: 29 ?Encounter Discharge Information Items ?Discharge Condition: Stable ?Ambulatory Status: [name] ?Discharge Destination: Home ?Transportation: Private Auto ?Accompanied By: self ?Schedule Follow-up Appointment: No ?Clinical Summary of Care: ?Electronic Signature(s) ?Signed: 01/05/2022 2:13:00 PM By: [name] CHT EMT BS ?, , ?Entered By: [name] on 01/04/2022 16:02:04 ?-------------------------------------------------------------------------------- ?Vitals Details ?Patient Name: ?Date of Service: ?[name], [name] 01/04/2022 1:00 PM ?Medical Record Number: [id number] ?Patient Account Number: [id number] ?Date of Birth/Sex: ?Treating RN: ?December 17, 1940 (81 y.o. F) [name], [name] ?Primary Care Talana Slatten: [name] ?Other Clinician: [name] ?Referring Bertrand Vowels: ?Treating Thena Devora/[name]: [name] ?[name] ?Weeks in Treatment: 29 ?Vital Signs ?Time Taken: 13:25 ?Temperature (??F): 97.9 ?Height (in): 64 ?Pulse (bpm): 67 ?Weight (lbs): 135 ?Respiratory Rate (breaths/min): 16 ?Body Mass Index (BMI): 23.2 ?Blood Pressure (mmHg): 170/52 ?Capillary Blood Glucose (mg/dl): 145 ?Reference Range: 80 - 120 mg / dl ?Electronic Signature(s) ?Signed: 01/05/2022 2:13:00 PM By: [name] CHT EMT BS ?, , ?Entered By: [name] on 01/04/2022 14:29:59 ?

## 2022-01-05 NOTE — Progress Notes (Signed)
[name], [name] ([id number]) ?Visit Report for 01/03/2022 ?Arrival Information Details ?Patient Name: Date of Service: ?[name], [name] 01/03/2022 3:00 PM ?Medical Record Number: [id number] ?Patient Account Number: [id number] ?Date of Birth/Sex: Treating RN: ?23-Sep-1941 (81 y.o. F) ?Primary Care [name]: [name] Other Clinician: ?Referring [name]: ?Treating [name]/[name]: [name] ?[name] ?Weeks in Treatment: 28 ?Visit Information History Since Last Visit ?Added or deleted any medications: No ?Patient Arrived: [name] ?Any new allergies or adverse reactions: No ?Arrival Time: 15:26 ?Had a fall or experienced change in No ?Accompanied By: Self ?activities of daily living that may affect ?Transfer Assistance: None ?risk of falls: ?Patient Identification Verified: Yes ?Signs or symptoms of abuse/neglect since last visito No ?Secondary Verification Process Completed: Yes ?Hospitalized since last visit: No ?Patient Requires Transmission-Based Precautions: No ?Implantable device outside of the clinic excluding No ?Patient Has Alerts: Yes ?cellular tissue based products placed in the center ?Patient Alerts: Patient on Blood Thinner since last visit: ?Has Dressing in Place as Prescribed: Yes ?Pain Present Now: No ?Electronic Signature(s) ?Signed: 01/04/2022 10:45:31 AM By: [name] ?Entered By: [name] on 01/03/2022 15:27:13 ?-------------------------------------------------------------------------------- ?Encounter Discharge Information Details ?Patient Name: Date of Service: ?[name], [name] 01/03/2022 3:00 PM ?Medical Record Number: [id number] ?Patient Account Number: [id number] ?Date of Birth/Sex: Treating RN: ?1941/02/03 (81 y.o. F) [name] ?Primary Care Kamsiyochukwu Spickler: [name] Other Clinician: ?Referring Finas Delone: ?Treating Alexina Niccoli/[name]: [name] ?[name] ?Weeks in Treatment: 28 ?Encounter Discharge Information Items Post Procedure  Vitals ?Discharge Condition: Stable ?Temperature (F): 97.7 ?Ambulatory Status: [name] ?Pulse (bpm): 80 ?Discharge Destination: Home ?Respiratory Rate (breaths/min): 14 ?Transportation: Private Auto ?Blood Pressure (mmHg): 138/58 ?Schedule Follow-up Appointment: Yes ?Clinical Summary of Care: Patient Declined ?Electronic Signature(s) ?Signed: 01/04/2022 5:23:26 PM By: [name] Creamer RN, BSN ?Entered By: [name] on 01/03/2022 16:16:52 ?-------------------------------------------------------------------------------- ?Lower Extremity Assessment Details ?Patient Name: ?Date of Service: ?[name], [name] 01/03/2022 3:00 PM ?Medical Record Number: [id number] ?Patient Account Number: [id number] ?Date of Birth/Sex: ?Treating RN: ?[birth year] (81 y.o. F) [name] ?Primary Care Vander Kueker: [name] ?Other Clinician: ?Referring Delano Scardino: ?Treating Debrina Kizer/[name]: [name] ?[name] ?Weeks in Treatment: 28 ?Edema Assessment ?Assessed: [Left: No] [Right: No] ?Edema: [Left: No] [Right: Yes] ?Calf ?Left: Right: ?Point of Measurement: From Medial Instep 31 cm 32 cm ?Ankle ?Left: Right: ?Point of Measurement: From Medial Instep 19.4 cm 19.3 cm ?Vascular Assessment ?Pulses: ?Dorsalis Pedis ?Palpable: [Left:Yes] [Right:Yes] ?Electronic Signature(s) ?Signed: 01/04/2022 5:23:26 PM By: [name] Creamer RN, BSN ?Entered By: [name] on 01/03/2022 15:39:44 ?-------------------------------------------------------------------------------- ?Multi Wound Chart Details ?Patient Name: ?Date of Service: ?[name], [name] 01/03/2022 3:00 PM ?Medical Record Number: [id number] ?Patient Account Number: [id number] ?Date of Birth/Sex: ?Treating RN: ?[birth year] (81 y.o. F) ?Primary Care Aristeo Hankerson: [name] ?Other Clinician: ?Referring Hosea Hanawalt: ?Treating Glendoris Nodarse/[name]: [name] ?[name] ?Weeks in Treatment: 28 ?Vital Signs ?Height(in): 64 ?Capillary Blood Glucose(mg/dl):  93 ?Weight(lbs): 135 ?Pulse(bpm): 80 ?Body Mass Index(BMI): 23.2 ?Blood Pressure(mmHg): 138/58 ?Temperature(??F): 97.7 ?Respiratory Rate(breaths/min): 14 ?Photos: [13:Left Amputation Site - Transmetatarsal Right Amputation Site -] [N/A:N/A N/A] ?Wound Location: [13:Surgical Injury] [15:Transmetatarsal Other Lesion] [N/A:N/A] ?Wounding Event: [13:Diabetic Wound/Ulcer of the Lower] [15:Diabetic Wound/Ulcer of the Lower] [N/A:N/A] ?Primary Etiology: [13:Extremity Open Surgical Wound] [15:Extremity N/A] [N/A:N/A] ?Secondary Etiology: [13:Coronary Artery Disease, Deep Vein Coronary Artery Disease, Deep Vein N/A] ?Comorbid History: [13:Thrombosis, Hypertension, Myocardial Thrombosis, Hypertension, Myocardial Infarction, Peripheral Arterial Disease, Infarction, Peripheral Arterial Disease, Type II Diabetes 05/11/2021] [15:Type II Diabetes 09/15/2021] [N/A:N/A] ?Date Acquired: [13:28] [15:15] [N/A:N/A] ?Weeks of Treatment: [13:Open] [15:Open] [  N/A:N/A] ?Wound Status: [13:No] [15:No] [N/A:N/A] ?Wound Recurrence: [13:0.1x0.1x0.1] [15:0.1x0.1x0.1] [N/A:N/A] ?Measurements L x W x D (cm) [13:0.008] [15:0.008] [N/A:N/A] ?A (cm?) : ?rea [13:0.001] [15:0.001] [N/A:N/A] ?Volume (cm?) : [13:100.00%] [15:98.00%] [N/A:N/A] ?% Reduction in A [13:rea: 100.00%] [15:99.20%] [N/A:N/A] ?% Reduction in Volume: [13:Grade 2] [15:Grade 3] [N/A:N/A] ?Classification: [13:Small] [15:Medium] [N/A:N/A] ?Exudate A mount: [13:Serosanguineous] [15:Serosanguineous] [N/A:N/A] ?Exudate Type: [13:red, brown] [15:red, brown] [N/A:N/A] ?Exudate Color: [13:Distinct, outline attached] [15:Thickened] [N/A:N/A] ?Wound Margin: [13:Large (67-100%)] [15:Small (1-33%)] [N/A:N/A] ?Granulation A mount: [13:Pink] [15:Pink] [N/A:N/A] ?Granulation Quality: [13:None Present (0%)] [15:Large (67-100%)] [N/A:N/A] ?Necrotic A mount: [13:N/A] [15:Eschar] [N/A:N/A] ?Necrotic Tissue: ?[13:Fat Layer (Subcutaneous Tissue): Yes Fat Layer (Subcutaneous Tissue): Yes N/A] ?Exposed  Structures: ?[13:Fascia: No Tendon: No Muscle: No Joint: No Bone: No Large (67-100%)] [15:Fascia: No Tendon: No Muscle: No Joint: No Bone: No Large (67-100%)] [N/A:N/A] ?Epithelialization: [13:Debridement - Selective/Open Wound Debridement - Selective/Open Wound N/A] ?Debridement: ?Pre-procedure Verification/Time Out 15:45 [15:15:45] [N/A:N/A] ?Taken: [13:Lidocaine 4% Topical Solution] [15:Lidocaine 4% Topical Solution] [N/A:N/A] ?Pain Control: [13:Necrotic/Eschar] [15:Necrotic/Eschar] [N/A:N/A] ?Tissue Debrided: [13:Non-Viable Tissue] [15:Non-Viable Tissue] [N/A:N/A] ?Level: [13:0.01] [15:0.04] [N/A:N/A] ?Debridement A (sq cm): [13:rea Curette] [15:Curette] [N/A:N/A] ?Instrument: [13:None] [15:None] [N/A:N/A] ?Bleeding: [13:0] [15:0] [N/A:N/A] ?Procedural Pain: [13:0] [15:0] [N/A:N/A] ?Post Procedural Pain: [13:Procedure was tolerated well] [15:Procedure was tolerated well] [N/A:N/A] ?Debridement Treatment Response: [13:0.1x0.1x0.1] [15:0x0x0] [N/A:N/A] ?Post Debridement Measurements L x ?W x D (cm) [13:0.001] [15:0] [N/A:N/A] ?Post Debridement Volume: (cm?) [13:Debridement] [15:Debridement] [N/A:N/A] ?Treatment Notes ?Electronic Signature(s) ?Signed: 01/03/2022 3:52:09 PM By: [name] Maudlin MD FACS ?Entered By: [name] on 01/03/2022 15:52:09 ?-------------------------------------------------------------------------------- ?Multi-Disciplinary Care Plan Details ?Patient Name: ?Date of Service: ?[name], [name] 01/03/2022 3:00 PM ?Medical Record Number: [id number] ?Patient Account Number: [id number] ?Date of Birth/Sex: ?Treating RN: ?[birth year] (81 y.o. F) [name] ?Primary Care Linday Rhodes: [name] ?Other Clinician: ?Referring Dhruva Orndoff: ?Treating Belma Dyches/[name]: [name] ?[name] ?Weeks in Treatment: 28 ?Multidisciplinary Care Plan reviewed with physician ?Active Inactive ?Nutrition ?Nursing Diagnoses: ?Impaired glucose control: actual or potential ?Potential for  alteratiion in Nutrition/Potential for imbalanced nutrition ?Goals: ?Patient/caregiver agrees to and verbalizes understanding of need to use nutritional supplements and/or vitamins as prescribed ?Date Initiated: 06/15/2021 ?Date

## 2022-01-05 NOTE — Progress Notes (Signed)
? ? ?Patient ID: [name], female   DOB: October 20, 1940, 81 y.o.   MRN: [id number] ? 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 She has been on daptomycin plus meropenem for 6 wk with end date today. She also follows at wound care center where she has been getting hbo for TMA right foot by Dr. Earleen Newport DPM.  She went on the require Right   common femoral to above-knee popliteal artery bypass with 6 mm ringed PTFE. She has developed right TMA wound over medial aspect of previous TMA amputation and slightly on lateral aspect over 5th MT area. She follows with wound care and has been undergoing HBO which has been going well. She is healed from her right foot. She also has pinpoint wound on left TMA that HBO is helping with healing. She is no longer on abtx since end of march. No worsening signs of cellulitis ? ?Outpatient Encounter Medications as of 01/05/2022  ?Medication Sig  ? acetaminophen (TYLENOL) 500 MG tablet Take 1,000 mg by mouth every 6 (six) hours as needed for moderate pain or headache.  ? aspirin EC 81 MG tablet Take 81 mg by mouth at bedtime.   ? atorvastatin (LIPITOR) 80 MG tablet TAKE 1 TABLET BY MOUTH ONCE DAILY. PATIENT  MUST  KEEP  UPCOMING  APPOINTMENT  FOR  FUTURE  REFILLS  ? brimonidine (ALPHAGAN) 0.2 % ophthalmic solution Place 1 drop into the right eye 2 (two) times daily.  ? CINNAMON PO Take 2 tablets by mouth in the morning, at noon, and at bedtime.   ? empagliflozin (JARDIANCE) 25 MG TABS tablet Take 1 tablet (25 mg total) by mouth daily before breakfast.  ? FEROCON capsule Take 1 capsule by mouth 2 (two) times daily.  ? fluconazole (DIFLUCAN) 150 MG tablet Take 1 tablet (150 mg total) by mouth daily. Can take 1 tab every 3 days if still having symptoms  ? glimepiride (AMARYL) 2 MG tablet TAKE 1 TABLET BY MOUTH ONCE DAILY BEFORE BREAKFAST  ? glucose blood (ONETOUCH VERIO) test strip Use to check blood sugars once daily  ?  hydrochlorothiazide (HYDRODIURIL) 12.5 MG tablet Take 1 tablet by mouth once daily  ? Lancets (ONETOUCH DELICA PLUS [id number]) MISC Use to check blood sugars twice daily.  ? metFORMIN (GLUCOPHAGE) 1000 MG tablet Take 1 tablet by mouth twice daily (Patient taking differently: Take 1,000 mg by mouth in the morning.)  ? metoprolol tartrate (LOPRESSOR) 25 MG tablet TAKE 1 & 1/2 (ONE & ONE-HALF) TABLETS BY MOUTH TWICE DAILY  ? NON FORMULARY Prisma topical ointment for right foot  ? pantoprazole (PROTONIX) 40 MG tablet Take 1 tablet by mouth once daily  ? Probiotic Product ([employer] SENIOR PROBIOTIC PO) Take 1 capsule by mouth every evening.  ? Propylene Glycol (SYSTANE BALANCE) 0.6 % SOLN Place 1 drop into the left eye 3 (three) times daily as needed (dry/irritated eyes).  ? pyridoxine (B-6) 100 MG tablet Take 100 mg by mouth every evening.   ? repaglinide (PRANDIN) 1 MG tablet Take 1 tablet (1 mg total) by mouth daily before breakfast.  ? telmisartan (MICARDIS) 80 MG tablet Take 1 tablet by mouth once daily  ? XARELTO 2.5 MG TABS tablet Take 1 tablet by mouth twice daily  ? ?Facility-Administered Encounter Medications as of 01/05/2022  ?Medication  ? sodium chloride flush (NS) 0.9 % injection 3 mL  ?  ? ?Patient Active Problem List  ? Diagnosis Date Noted  ?  S/P angiogram of extremity 10/13/2021  ? Osteomyelitis of foot, left, acute ([place]) 04/27/2021  ? Cellulitis in diabetic foot ([place]) 04/27/2021  ? Acute kidney injury superimposed on chronic kidney disease ([place]) 04/27/2021  ? Hyponatremia 04/27/2021  ? Anemia, chronic disease 04/27/2021  ? Cellulitis of left lower extremity   ? 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 Left epiretinal membrane 05/09/2020  ? 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 NSTEMI (non-ST elevated myocardial infarction) ([place]) 03/22/2020  ? Bifascicular block 03/22/2020  ? S/P angioplasty with  stent Lt SFA of prox segment.  PTCAs with drug coated balloon Lt ant tibial artery and Lt popliteal artery  11/26/2019  ? Gangrene of foot ([place]) 08/07/2019  ? Normocytic anemia 08/07/2019  ? 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 Critical lower limb ischemia ([place]) 08/03/2019  ? Encounter for immunization 07/09/2016  ? Essential hypertension 03/31/2013  ? Dyslipidemia 03/31/2013  ? DM2 (diabetes mellitus, type 2) ([place]) 03/31/2013  ? Overweight (BMI 25.0-29.9) 03/31/2013  ? Carotid artery disease ([place]) 03/31/2013  ? PAD (peripheral artery disease) ([place]) 03/31/2013  ? ? ? ?Health Maintenance Due  ?Topic Date Due  ? Hepatitis C Screening  Never done  ? FOOT EXAM  05/02/2019  ? TETANUS/TDAP  01/21/2021  ?  ? ?Review of Systems ?12 point ros is otherwise negative ?Physical Exam  ? ?BP (!) 163/69   Pulse 74   Temp 98.3 ?F (36.8 ?C) (Oral)   Wt 148 lb (67.1 kg)   SpO2 97%   BMI 24.63 kg/m?   ? ?Gen =a x o by 3 in nad ?Ext =right TMA small eschar healed. No fluctuance or erythema ? ?CBC ?Lab Results  ?Component Value Date  ? WBC 9.5 10/27/2021  ? RBC 3.48 (L) 10/27/2021  ? HGB 10.4 (L) 10/27/2021  ? HCT 33.1 (L) 10/27/2021  ? PLT 285 10/27/2021  ? MCV 95.1 10/27/2021  ? MCH 29.9 10/27/2021  ? MCHC 31.4 10/27/2021  ? RDW 15.9 (H) 10/27/2021  ? LYMPHSABS 2.0 10/27/2021  ? MONOABS 0.6 10/27/2021  ? EOSABS 0.5 10/27/2021  ? ? ?BMET ?Lab Results  ?Component Value Date  ? NA 141 11/28/2021  ? K 4.4 11/28/2021  ? CL 102 11/28/2021  ? CO2 31 11/28/2021  ? GLUCOSE 174 (H) 11/28/2021  ? BUN 31 (H) 11/28/2021  ? CREATININE 1.02 11/28/2021  ? CALCIUM 10.3 11/28/2021  ? GFRNONAA >60 10/27/2021  ? GFRAA 75 07/29/2020  ? ? ? ? ?Assessment and Plan ?DFU/osteomyelitis = finished course of abtx, has been off of abtx doing well with local wound care and hbo. Overall, ?Doing well. No need for further follow up on our behalf. ? ?Rtc prn ? ? ?

## 2022-01-08 DIAGNOSIS — Z4781 Encounter for orthopedic aftercare following surgical amputation: Secondary | ICD-10-CM | POA: Diagnosis not present

## 2022-01-08 DIAGNOSIS — Z89412 Acquired absence of left great toe: Secondary | ICD-10-CM | POA: Diagnosis not present

## 2022-01-08 DIAGNOSIS — D638 Anemia in other chronic diseases classified elsewhere: Secondary | ICD-10-CM | POA: Diagnosis not present

## 2022-01-08 DIAGNOSIS — I70202 Unspecified atherosclerosis of native arteries of extremities, left leg: Secondary | ICD-10-CM | POA: Diagnosis not present

## 2022-01-08 DIAGNOSIS — E11628 Type 2 diabetes mellitus with other skin complications: Secondary | ICD-10-CM | POA: Diagnosis not present

## 2022-01-08 DIAGNOSIS — I10 Essential (primary) hypertension: Secondary | ICD-10-CM | POA: Diagnosis not present

## 2022-01-09 ENCOUNTER — Encounter (HOSPITAL_BASED_OUTPATIENT_CLINIC_OR_DEPARTMENT_OTHER): Payer: Medicare Other | Admitting: Internal Medicine

## 2022-01-09 DIAGNOSIS — M86671 Other chronic osteomyelitis, right ankle and foot: Secondary | ICD-10-CM

## 2022-01-09 DIAGNOSIS — E11621 Type 2 diabetes mellitus with foot ulcer: Secondary | ICD-10-CM

## 2022-01-09 DIAGNOSIS — E1151 Type 2 diabetes mellitus with diabetic peripheral angiopathy without gangrene: Secondary | ICD-10-CM | POA: Diagnosis not present

## 2022-01-09 DIAGNOSIS — L97514 Non-pressure chronic ulcer of other part of right foot with necrosis of bone: Secondary | ICD-10-CM | POA: Diagnosis not present

## 2022-01-09 DIAGNOSIS — T8131XD Disruption of external operation (surgical) wound, not elsewhere classified, subsequent encounter: Secondary | ICD-10-CM | POA: Diagnosis not present

## 2022-01-09 LAB — GLUCOSE, CAPILLARY
Glucose-Capillary: 208 mg/dL — ABNORMAL HIGH (ref 70–99)
Glucose-Capillary: 152 mg/dL — ABNORMAL HIGH (ref 70–99)

## 2022-01-09 NOTE — Progress Notes (Addendum)
KRYSTEN, VERONICA ([id number]) ?Visit Report for 01/09/2022 ?Arrival Information Details ?Patient Name: Date of Service: ?[name], [name] LLY H. 01/09/2022 12:30 PM ?Medical Record Number: [id number] ?Patient Account Number: [id number] ?Date of Birth/Sex: Treating RN: ?1940-11-09 (81 y.o. [name], [name] ?Primary Care [name] ?Other Clinician: Valeria [name] ?Referring [name]: ?Treating [name]/[name]: [name] ?[name] ?Weeks in Treatment: 29 ?Visit Information History Since Last Visit ?All ordered tests and consults were completed: Yes ?Patient Arrived: Ambulatory ?Added or deleted any medications: No ?Arrival Time: 12:43 ?Any new allergies or adverse reactions: No ?Accompanied By: None ?Had a fall or experienced change in No ?Transfer Assistance: None ?activities of daily living that may affect ?Patient Identification Verified: Yes ?risk of falls: ?Secondary Verification Process Completed: Yes ?Signs or symptoms of abuse/neglect since last visito No ?Patient Requires Transmission-Based Precautions: No ?Hospitalized since last visit: No ?Patient Has Alerts: Yes ?Implantable device outside of the clinic excluding No ?Patient Alerts: Patient on Blood Thinner cellular tissue based products placed in the center ?since last visit: ?Pain Present Now: No ?Electronic Signature(s) ?Signed: 01/09/2022 2:32:47 PM By: [name] EMT ?Entered By: [name] on 01/09/2022 14:32:47 ?-------------------------------------------------------------------------------- ?Encounter Discharge Information Details ?Patient Name: Date of Service: ?[name], [name] LLY H. 01/09/2022 12:30 PM ?Medical Record Number: [id number] ?Patient Account Number: [id number] ?Date of Birth/Sex: Treating RN: ?Oct 04, 1940 (81 y.o. [name], [name] ?Primary Care [name]: [name] ?Other Clinician: Valeria [name] ?Referring Savana Spina: ?Treating [name]/[name]: [name] ?[name] ?Weeks in  Treatment: 29 ?Encounter Discharge Information Items ?Discharge Condition: Stable ?Ambulatory Status: [name] ?Discharge Destination: Home ?Transportation: Private Auto ?Accompanied By: None ?Schedule Follow-up Appointment: Yes ?Clinical Summary of Care: ?Electronic Signature(s) ?Signed: 01/09/2022 3:45:04 PM By: [name] EMT ?Entered By: [name] on 01/09/2022 15:45:03 ?-------------------------------------------------------------------------------- ?Vitals Details ?Patient Name: ?Date of Service: ?[name], [name] LLY H. 01/09/2022 12:30 PM ?Medical Record Number: [id number] ?Patient Account Number: [id number] ?Date of Birth/Sex: ?Treating RN: ?August 04, 1941 (81 y.o. [name], [name] ?Primary Care [name]: [name] ?Other Clinician: Valeria [name] ?Referring Kellyn Mccary: ?Treating [name]/[name]: [name] ?[name] ?Weeks in Treatment: 29 ?Vital Signs ?Time Taken: 12:52 ?Temperature (??F): 97.5 ?Height (in): 64 ?Pulse (bpm): 71 ?Weight (lbs): 135 ?Respiratory Rate (breaths/min): 14 ?Body Mass Index (BMI): 23.2 ?Blood Pressure (mmHg): 139/68 ?Capillary Blood Glucose (mg/dl): 208 ?Reference Range: 80 - 120 mg / dl ?Electronic Signature(s) ?Signed: 01/09/2022 2:34:37 PM By: [name] EMT ?Entered By: [name] on 01/09/2022 14:34:37 ?

## 2022-01-09 NOTE — Progress Notes (Addendum)
[name], [name] ([id number]) ?Visit Report for 01/09/2022 ?HBO Details ?Patient Name: Date of Service: ?[name], [name] 01/09/2022 12:30 PM ?Medical Record Number: [id number] ?Patient Account Number: [id number] ?Date of Birth/Sex: Treating RN: ?Jul 14, 1941 (81 y.o. [name], [name] ?Primary Care Electra Paladino: [name] ?Other Clinician: [name] ?Referring Emaad Nanna: ?Treating Zoha Spranger/[name]: [name] ?[name] ?Weeks in Treatment: 29 ?HBO Treatment Course Details ?Treatment Course Number: 2 ?Ordering Maleko Greulich: [name] ?T Treatments Ordered: ?otal 50 HBO Treatment Start Date: 11/08/2021 ?HBO Indication: ?Diabetic Ulcer(s) of the Lower Extremity ?Notes: Extended 2/29/2023 ?Standard/Conservative Wound Care tried and failed greater than or equal to ?30 days ?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 ?HBO Treatment Details ?Treatment Number: 18 ?Patient Type: Outpatient ?Chamber Type: Monoplace ?Chamber Serial #: [id number] ?Treatment Protocol: 2.0 ATA with 90 minutes oxygen, and no air breaks ?Treatment Details ?Compression Rate Down: 2.0 psi / minute De-Compression Rate Up: ?Air ?breaks ?and ?breathing Decompress Decompress ?Compress Tx Pressure ?Begins Reached periods Begins Ends ?(leave ?unused ?spaces ?blank) ?Chamber Pressure (ATA 1 2 ------2 1 ?) ?Clock Time (24 hr) 13:14 13:22 - - - - - - 14:52 15:00 ?Treatment Length: 106 (minutes) ?Treatment Segments: 4 ?Vital Signs ?Capillary Blood Glucose Reference Range: 80 - 120 mg / dl ?HBO Diabetic Blood Glucose Intervention Range: <131 mg/dl or >249 mg/dl ?Time Vitals Blood Respiratory Capillary Blood Glucose Pulse Action ?Type: ?Pulse: Temperature: ?Taken: ?Pressure: ?Rate: ?Glucose (mg/dl): ?Meter #: Oximetry (%) Taken: ?Pre 12:52 139/68 71 14 97.5 208 ?Post 15:06 154/53 63 14 98.1 152 ?Treatment Response ?Treatment Toleration: Well ?Treatment Completion Status: Treatment Completed without Adverse Event ?Additional Procedure  Documentation ?Tissue Sevierity: Fat layer exposed ?Physician HBO Attestation: ?I certify that I supervised this HBO treatment in accordance with Medicare ?guidelines. A trained emergency response team is readily available per Yes ?hospital policies and procedures. ?Continue HBOT as ordered. Yes ?Electronic Signature(s) ?Signed: 01/10/2022 5:24:17 PM By: [name] DO ?Signed: 01/10/2022 5:24:17 PM By: [name] DO ?Previous Signature: 01/09/2022 3:34:41 PM Version By: [name] EMT ?Previous Signature: 01/09/2022 2:42:11 PM Version By: [name] EMT ?Entered By: [name] on 01/09/2022 17:21:10 ?-------------------------------------------------------------------------------- ?HBO Safety Checklist Details ?Patient Name: ?Date of Service: ?[name], [name] 01/09/2022 12:30 PM ?Medical Record Number: [id number] ?Patient Account Number: [id number] ?Date of Birth/Sex: ?Treating RN: ?1941/07/01 (81 y.o. [name], [name] ?Primary Care Burgess Sheriff: [name] ?Other Clinician: [name] ?Referring Jeremyah Jelley: ?Treating Atwood Adcock/[name]: [name] ?[name] ?Weeks in Treatment: 29 ?HBO Safety Checklist Items ?Safety Checklist ?Consent Form Signed ?Patient voided / foley secured and emptied ?When did you last eato 0930 ?Last dose of injectable or oral agent 0930 ?Ostomy pouch emptied and vented if applicable ?NA ?All implantable devices assessed, documented and approved ?NA ?Intravenous access site secured and place ?NA ?Valuables secured ?Linens and cotton and cotton/polyester blend (less than 51% polyester) ?Personal oil-based products / skin lotions / body lotions removed ?Wigs or hairpieces removed ?Smoking or tobacco materials removed ?Books / newspapers / magazines / loose paper removed ?Cologne, aftershave, perfume and deodorant removed ?Jewelry removed (may wrap wedding band) ?NA ?Make-up removed ?Hair care products removed ?Battery operated devices (external)  removed ?Heating patches and chemical warmers removed ?Titanium eyewear removed ?NA ?Nail polish cured greater than 10 hours last week ?Casting material cured greater than 10 hours ?NA ?Hearing aids removed ?NA ?Loose dentures or partials removed ?NA ?Prosthetics have been removed ?NA ?Patient demonstrates correct use of air break device (if applicable) ?Patient concerns have been addressed ?Patient grounding  bracelet on and cord attached to chamber ?Specifics for Inpatients (complete in addition to above) ?Medication sheet sent with patient ?NA ?Intravenous medications needed or due during therapy sent with patient ?NA ?Drainage tubes (e.g. nasogastric tube or chest tube secured and vented) ?NA ?Endotracheal or Tracheotomy tube secured ?NA ?Cuff deflated of air and inflated with saline ?NA ?Airway suctioned ?NA ?Notes ?Paper version used prior to treatment. The checklist was done before treatment started. ?Electronic Signature(s) ?Signed: 01/09/2022 2:39:27 PM By: [name] EMT ?Entered By: [name] on 01/09/2022 14:39:27 ?

## 2022-01-10 ENCOUNTER — Encounter (HOSPITAL_BASED_OUTPATIENT_CLINIC_OR_DEPARTMENT_OTHER): Payer: Medicare Other | Admitting: General Surgery

## 2022-01-10 DIAGNOSIS — E11628 Type 2 diabetes mellitus with other skin complications: Secondary | ICD-10-CM | POA: Diagnosis not present

## 2022-01-10 DIAGNOSIS — I10 Essential (primary) hypertension: Secondary | ICD-10-CM | POA: Diagnosis not present

## 2022-01-10 DIAGNOSIS — Z4781 Encounter for orthopedic aftercare following surgical amputation: Secondary | ICD-10-CM | POA: Diagnosis not present

## 2022-01-10 DIAGNOSIS — E1151 Type 2 diabetes mellitus with diabetic peripheral angiopathy without gangrene: Secondary | ICD-10-CM | POA: Diagnosis not present

## 2022-01-10 DIAGNOSIS — I70202 Unspecified atherosclerosis of native arteries of extremities, left leg: Secondary | ICD-10-CM | POA: Diagnosis not present

## 2022-01-10 DIAGNOSIS — L97514 Non-pressure chronic ulcer of other part of right foot with necrosis of bone: Secondary | ICD-10-CM | POA: Diagnosis not present

## 2022-01-10 DIAGNOSIS — L97512 Non-pressure chronic ulcer of other part of right foot with fat layer exposed: Secondary | ICD-10-CM | POA: Diagnosis not present

## 2022-01-10 DIAGNOSIS — Z89412 Acquired absence of left great toe: Secondary | ICD-10-CM | POA: Diagnosis not present

## 2022-01-10 DIAGNOSIS — T8131XD Disruption of external operation (surgical) wound, not elsewhere classified, subsequent encounter: Secondary | ICD-10-CM | POA: Diagnosis not present

## 2022-01-10 DIAGNOSIS — M86671 Other chronic osteomyelitis, right ankle and foot: Secondary | ICD-10-CM | POA: Diagnosis not present

## 2022-01-10 DIAGNOSIS — E11621 Type 2 diabetes mellitus with foot ulcer: Secondary | ICD-10-CM | POA: Diagnosis not present

## 2022-01-10 DIAGNOSIS — D638 Anemia in other chronic diseases classified elsewhere: Secondary | ICD-10-CM | POA: Diagnosis not present

## 2022-01-10 LAB — GLUCOSE, CAPILLARY
Glucose-Capillary: 219 mg/dL — ABNORMAL HIGH (ref 70–99)
Glucose-Capillary: 101 mg/dL — ABNORMAL HIGH (ref 70–99)

## 2022-01-10 NOTE — Progress Notes (Signed)
[name], [name] ([id number]) ?Visit Report for 01/09/2022 ?Problem List Details ?Patient Name: Date of Service: ?[name], [name] 01/09/2022 12:30 PM ?Medical Record Number: [id number] ?Patient Account Number: [id number] ?Date of Birth/Sex: Treating RN: ?12-25-1940 (81 y.o. 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 06/15/2021 No Yes ?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, right ankle and foot 10/25/2021 No Yes ?Inactive Problems ?ICD-10 ?Code Description Active Date Inactive Date ?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 01/09/2022 ?Medical Record Number: [id number] ?Patient Account Number: [id number] ?Date of Birth/Sex: ?Treating RN: ?04/08/1941 (81 y.o. 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 ?M46.803 Other chronic osteomyelitis, right ankle and foot ?Facility Procedures ?CPT4 Code: 21224825 ?Description: G0277-(Facility Use Only) HBOT full body chamber, 54mn , ICD-10 Diagnosis Description 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 M86.671 Other  ?chronic osteomyelitis, right ankle and foot T81.31XD Disruption of external operation (surgical) wound, not elsewhere classified, subse ?Modifier: quent encounter ?Quantity: 4 ?Physician Procedures ?: CPT4 Code Description Modifier [id number]- WC PHYS HYPERBARIC OXYGEN THERAPY ICD-10 Diagnosis Description 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

## 2022-01-10 NOTE — Progress Notes (Addendum)
[name], BIEHLER ([id number]) ?Visit Report for 01/10/2022 ?HBO Details ?Patient Name: Date of Service: ?[name], [name] 01/10/2022 12:30 PM ?Medical Record Number: [id number] ?Patient Account Number: [id number] ?Date of Birth/Sex: Treating RN: ?1941/04/06 (81 y.o. 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 ?HBO Treatment Details ?Treatment Number: 30 ?Patient Type: Outpatient ?Chamber Type: Monoplace ?Chamber Serial #: [id number] ?Treatment Protocol: 2.0 ATA with 90 minutes oxygen, and no air breaks ?Treatment Details ?Compression Rate Down: 2.0 psi / minute ?De-Compression Rate Up: 2.0 psi / minute ?Air ?breaks ?and ?breathing Decompress Decompress ?Compress Tx Pressure ?Begins Reached periods Begins Ends ?(leave ?unused ?spaces ?blank) ?Chamber Pressure (ATA 1 2 ------2 1 ?) ?Clock Time (24 hr) 13:23 13:32 - - - - - - 15:02 15:10 ?Treatment Length: 107 (minutes) ?Treatment Segments: 4 ?Vital Signs ?Capillary Blood Glucose Reference Range: 80 - 120 mg / dl ?HBO Diabetic Blood Glucose Intervention Range: <131 mg/dl or >249 mg/dl ?Type: Time Vitals Blood Pulse: Respiratory Temperature: Capillary Blood Glucose Pulse Action ?Taken: ?Pressure: ?Rate: ?Glucose (mg/dl): Meter #: Oximetry (%) Taken: ?Pre 13:20 142/58 68 16 98.6 219 100 ?Post 15:16 128/52 70 14 98 101 Patient given 1 Glucerna to drink. ?Treatment Response ?Treatment Toleration: Well ?Treatment Completion Status: Treatment  Completed without Adverse Event ?Additional Procedure Documentation ?Tissue Sevierity: Fat layer exposed ?Physician HBO Attestation: ?I certify that I supervised this HBO treatment in accordance with Medicare ?guidelines. A trained emergency response team is readily available per Yes ?hospital policies and procedures. ?Continue HBOT as ordered. Yes ?Electronic Signature(s) ?Signed: 01/10/2022 4:52:17 PM By: [name] MD FACS ?Signed: 01/10/2022 4:52:17 PM By: [name] MD FACS ?Previous Signature: 01/10/2022 3:35:54 PM Version By: [name] EMT ?Entered By: [name] on 01/10/2022 16:52:17 ?-------------------------------------------------------------------------------- ?HBO Safety Checklist Details ?Patient Name: ?Date of Service: ?[name], [name] 01/10/2022 12:30 PM ?Medical Record Number: [id number] ?Patient Account Number: [id number] ?Date of Birth/Sex: ?Treating RN: ?[birth year] (81 y.o. [name], [name] ?Primary Care Saraih Lorton: [name] ?Other Clinician: [name] ?Referring Aster Eckrich: ?Treating Nayan Proch/[name]: [name] ?[name] ?Weeks in Treatment: 29 ?HBO Safety Checklist Items ?Safety Checklist ?Consent Form Signed ?Patient voided / foley secured and emptied ?When did you last eato 0845 ?Last dose of injectable or oral agent 0845 ?Ostomy pouch emptied and vented if applicable ?NA ?All implantable devices assessed, documented and approved ?NA ?Intravenous access site secured and place ?NA ?Valuables secured ?Linens and cotton and cotton/polyester blend (less than 51% polyester) ?Personal oil-based products / skin lotions / body lotions removed ?Wigs or hairpieces removed Patient removed ?Smoking or tobacco materials removed ?Books / newspapers / magazines / loose paper removed ?Cologne, aftershave, perfume and deodorant removed ?Jewelry removed (may wrap wedding band) ?NA ?Make-up removed ?Hair care products removed ?Battery operated devices  (external) removed ?Heating patches and chemical warmers removed ?Titanium eyewear removed ?NA ?Nail polish cured greater than 10 hours ?NA ?Casting material cured greater than 10 hours ?NA ?Hearing aids removed ?NA ?Loose dentures or partials removed ?NA ?Prosthetics have been removed ?Patient demonstrates correct use of air break device (if applicable) ?Patient concerns have been  addressed ?Patient grounding bracelet on and cord attached to chamber ?Specifics for Inpatients (complete in addition to above) ?Medication sheet sent with patient ?NA ?Intravenous medications needed or due during therapy sent with patient ?NA ?Drainage tubes (e.g. nasogastric tube or chest tube secured and vented) ?NA ?Endotracheal or Tracheotomy tube secured ?NA ?Cuff deflated of air and inflated with saline ?NA ?Airway suctioned ?NA ?Notes ?Paper version used prior to treatment. The checklist was done before treatment started. ?Electronic Signature(s) ?Signed: 01/10/2022 3:31:45 PM By: [name] EMT ?Entered By: [name] on 01/10/2022 15:31:45 ?

## 2022-01-10 NOTE — Progress Notes (Signed)
STEPHIE, XU ([id number]) ?Visit Report for 01/10/2022 ?Problem List Details ?Patient Name: Date of Service: ?[name], [name] 01/10/2022 12:30 PM ?Medical Record Number: [id number] ?Patient Account Number: [id number] ?Date of Birth/Sex: Treating RN: ?Jul 02, 1941 (81 y.o. 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 06/15/2021 No Yes ?X65.537 Other chronic osteomyelitis, right ankle and foot 10/25/2021 No Yes ?Inactive Problems ?ICD-10 ?Code Description Active Date Inactive Date ?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 01/10/2022 ?Medical Record Number: [id number] ?Patient Account Number: [id number] ?Date of Birth/Sex: ?Treating RN: ?Mar 16, 1941 (81 y.o. 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 ?J44.920 Other chronic osteomyelitis, right ankle and foot ?Facility Procedures ?CPT4 Code: 10071219 ?Description: G0277-(Facility Use Only) HBOT full body chamber, 41mn , ICD-10 Diagnosis Description 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 M86.671 Other  ?chronic osteomyelitis, right ankle and foot T81.31XD Disruption of external operation (surgical) wound, not elsewhere classified, subse ?Modifier: quent encounter ?Quantity: 4 ?Physician Procedures ?: CPT4 Code Description Modifier [id number] 49826- WC PHYS HYPERBARIC OXYGEN THERAPY ICD-10 Diagnosis Description 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

## 2022-01-10 NOTE — Progress Notes (Signed)
LYNLEE, STRATTON ([id number]) ?Visit Report for 01/10/2022 ?Arrival Information Details ?Patient Name: Date of Service: ?[name], [name] 01/10/2022 12:30 PM ?Medical Record Number: [id number] ?Patient Account Number: [id number] ?Date of Birth/Sex: Treating RN: ?December 04, 1940 (81 y.o. [name], [name] ?Primary Care Cheetara Hoge: [name] ?Other Clinician: [name] ?Referring Tonio Seider: ?Treating Zohal Reny/[name]: [name] ?[name] ?Weeks in Treatment: 29 ?Visit Information History Since Last Visit ?All ordered tests and consults were completed: Yes ?Patient Arrived: Ambulatory ?Added or deleted any medications: No ?Arrival Time: 12:51 ?Any new allergies or adverse reactions: No ?Accompanied By: None ?Had a fall or experienced change in No ?Transfer Assistance: None ?activities of daily living that may affect ?Patient Identification Verified: Yes ?risk of falls: ?Secondary Verification Process Completed: Yes ?Signs or symptoms of abuse/neglect since last visito No ?Patient Requires Transmission-Based Precautions: No ?Hospitalized since last visit: No ?Patient Has Alerts: Yes ?Implantable device outside of the clinic excluding No ?Patient Alerts: Patient on Blood Thinner cellular tissue based products placed in the center ?since last visit: ?Pain Present Now: No ?Electronic Signature(s) ?Signed: 01/10/2022 3:28:50 PM By: [name] EMT ?Entered By: [name] on 01/10/2022 15:28:50 ?-------------------------------------------------------------------------------- ?Encounter Discharge Information Details ?Patient Name: Date of Service: ?[name], [name] 01/10/2022 12:30 PM ?Medical Record Number: [id number] ?Patient Account Number: [id number] ?Date of Birth/Sex: Treating RN: ?1941/09/02 (81 y.o. [name], [name] ?Primary Care Cordney Barstow: [name] ?Other Clinician: [name] ?Referring Brinnley Lacap: ?Treating Emaly Boschert/[name]: [name] ?[name] ?Weeks in  Treatment: 29 ?Encounter Discharge Information Items ?Discharge Condition: Stable ?Ambulatory Status: [name] ?Discharge Destination: Home ?Transportation: Private Auto ?Accompanied By: None ?Schedule Follow-up Appointment: Yes ?Clinical Summary of Care: ?Electronic Signature(s) ?Signed: 01/10/2022 3:38:30 PM By: [name] EMT ?Entered By: [name] on 01/10/2022 15:38:30 ?-------------------------------------------------------------------------------- ?Vitals Details ?Patient Name: ?Date of Service: ?[name], [name] 01/10/2022 12:30 PM ?Medical Record Number: [id number] ?Patient Account Number: [id number] ?Date of Birth/Sex: ?Treating RN: ?02-12-1941 (81 y.o. [name], [name] ?Primary Care Ezella Kell: [name] ?Other Clinician: [name] ?Referring Daouda Lonzo: ?Treating Gwendolyn Mclees/[name]: [name] ?[name] ?Weeks in Treatment: 29 ?Vital Signs ?Time Taken: 13:20 ?Temperature (??F): 98.6 ?Height (in): 64 ?Pulse (bpm): 68 ?Weight (lbs): 135 ?Respiratory Rate (breaths/min): 16 ?Body Mass Index (BMI): 23.2 ?Blood Pressure (mmHg): 142/58 ?Capillary Blood Glucose (mg/dl): 219 ?Reference Range: 80 - 120 mg / dl ?Airway ?Pulse Oximetry (%): 100 ?Electronic Signature(s) ?Signed: 01/10/2022 3:29:31 PM By: [name] EMT ?Entered By: [name] on 01/10/2022 15:29:31 ?

## 2022-01-11 ENCOUNTER — Encounter (HOSPITAL_BASED_OUTPATIENT_CLINIC_OR_DEPARTMENT_OTHER): Payer: Medicare Other | Admitting: General Surgery

## 2022-01-11 DIAGNOSIS — Z7982 Long term (current) use of aspirin: Secondary | ICD-10-CM | POA: Diagnosis not present

## 2022-01-11 DIAGNOSIS — H409 Unspecified glaucoma: Secondary | ICD-10-CM | POA: Diagnosis not present

## 2022-01-11 DIAGNOSIS — Z7901 Long term (current) use of anticoagulants: Secondary | ICD-10-CM | POA: Diagnosis not present

## 2022-01-11 DIAGNOSIS — D638 Anemia in other chronic diseases classified elsewhere: Secondary | ICD-10-CM | POA: Diagnosis not present

## 2022-01-11 DIAGNOSIS — E1151 Type 2 diabetes mellitus with diabetic peripheral angiopathy without gangrene: Secondary | ICD-10-CM | POA: Diagnosis not present

## 2022-01-11 DIAGNOSIS — L97511 Non-pressure chronic ulcer of other part of right foot limited to breakdown of skin: Secondary | ICD-10-CM | POA: Diagnosis not present

## 2022-01-11 DIAGNOSIS — Z89412 Acquired absence of left great toe: Secondary | ICD-10-CM | POA: Diagnosis not present

## 2022-01-11 DIAGNOSIS — Z8616 Personal history of COVID-19: Secondary | ICD-10-CM | POA: Diagnosis not present

## 2022-01-11 DIAGNOSIS — R262 Difficulty in walking, not elsewhere classified: Secondary | ICD-10-CM | POA: Diagnosis not present

## 2022-01-11 DIAGNOSIS — M86671 Other chronic osteomyelitis, right ankle and foot: Secondary | ICD-10-CM | POA: Diagnosis not present

## 2022-01-11 DIAGNOSIS — E785 Hyperlipidemia, unspecified: Secondary | ICD-10-CM | POA: Diagnosis not present

## 2022-01-11 DIAGNOSIS — Z89422 Acquired absence of other left toe(s): Secondary | ICD-10-CM | POA: Diagnosis not present

## 2022-01-11 DIAGNOSIS — M6281 Muscle weakness (generalized): Secondary | ICD-10-CM | POA: Diagnosis not present

## 2022-01-11 DIAGNOSIS — Z7984 Long term (current) use of oral hypoglycemic drugs: Secondary | ICD-10-CM | POA: Diagnosis not present

## 2022-01-11 DIAGNOSIS — Z4781 Encounter for orthopedic aftercare following surgical amputation: Secondary | ICD-10-CM | POA: Diagnosis not present

## 2022-01-11 DIAGNOSIS — I70202 Unspecified atherosclerosis of native arteries of extremities, left leg: Secondary | ICD-10-CM | POA: Diagnosis not present

## 2022-01-11 DIAGNOSIS — E11621 Type 2 diabetes mellitus with foot ulcer: Secondary | ICD-10-CM | POA: Diagnosis not present

## 2022-01-11 DIAGNOSIS — K219 Gastro-esophageal reflux disease without esophagitis: Secondary | ICD-10-CM | POA: Diagnosis not present

## 2022-01-11 DIAGNOSIS — L97514 Non-pressure chronic ulcer of other part of right foot with necrosis of bone: Secondary | ICD-10-CM | POA: Diagnosis not present

## 2022-01-11 DIAGNOSIS — I10 Essential (primary) hypertension: Secondary | ICD-10-CM | POA: Diagnosis not present

## 2022-01-11 DIAGNOSIS — T8131XD Disruption of external operation (surgical) wound, not elsewhere classified, subsequent encounter: Secondary | ICD-10-CM | POA: Diagnosis not present

## 2022-01-11 DIAGNOSIS — E11628 Type 2 diabetes mellitus with other skin complications: Secondary | ICD-10-CM | POA: Diagnosis not present

## 2022-01-11 LAB — GLUCOSE, CAPILLARY
Glucose-Capillary: 132 mg/dL — ABNORMAL HIGH (ref 70–99)
Glucose-Capillary: 117 mg/dL — ABNORMAL HIGH (ref 70–99)

## 2022-01-11 NOTE — Progress Notes (Addendum)
ALEIYAH, HALPIN ([id number]) ?Visit Report for 01/11/2022 ?Arrival Information Details ?Patient Name: Date of Service: ?[name], [name] 01/11/2022 12:30 PM ?Medical Record Number: [id number] ?Patient Account Number: [id number] ?Date of Birth/Sex: Treating RN: ?1940/11/25 (81 y.o. F) ?Primary Care Nyriah Coote: [name] Other Clinician: ?Referring Chavela Justiniano: ?Treating Fallon Howerter/[name]: [name] ?[name] ?Weeks in Treatment: 30 ?Visit Information History Since Last Visit ?All ordered tests and consults were completed: Yes ?Patient Arrived: [name] ?Added or deleted any medications: No ?Arrival Time: 12:36 ?Any new allergies or adverse reactions: No ?Accompanied By: None ?Had a fall or experienced change in No ?Transfer Assistance: None ?activities of daily living that may affect ?Patient Identification Verified: Yes ?risk of falls: ?Secondary Verification Process Completed: Yes ?Signs or symptoms of abuse/neglect since last visito No ?Patient Requires Transmission-Based Precautions: No ?Hospitalized since last visit: No ?Patient Has Alerts: Yes ?Implantable device outside of the clinic excluding No ?Patient Alerts: Patient on Blood Thinner cellular tissue based products placed in the center ?since last visit: ?Pain Present Now: No ?Notes ?Paper version used prior to treatment. ?Electronic Signature(s) ?Signed: 01/11/2022 1:45:45 PM By: [name] EMT ?Entered By: [name] on 01/11/2022 13:45:44 ?-------------------------------------------------------------------------------- ?Encounter Discharge Information Details ?Patient Name: Date of Service: ?[name], [name] 01/11/2022 12:30 PM ?Medical Record Number: [id number] ?Patient Account Number: [id number] ?Date of Birth/Sex: Treating RN: ?1941-08-08 (81 y.o. [name], [name] ?Primary Care Floyd Lusignan: [name] ?Other Clinician: [name] ?Referring Bassem Bernasconi: ?Treating Leslea Vowles/[name]: [name] ?[name] ?Weeks  in Treatment: 30 ?Encounter Discharge Information Items ?Discharge Condition: Stable ?Ambulatory Status: [name] ?Discharge Destination: Home ?Transportation: Private Auto ?Accompanied By: None ?Schedule Follow-up Appointment: Yes ?Clinical Summary of Care: ?Electronic Signature(s) ?Signed: 01/11/2022 3:07:50 PM By: [name] EMT ?Entered By: [name] on 01/11/2022 15:07:49 ?-------------------------------------------------------------------------------- ?Vitals Details ?Patient Name: ?Date of Service: ?[name], [name] 01/11/2022 12:30 PM ?Medical Record Number: [id number] ?Patient Account Number: [id number] ?Date of Birth/Sex: ?Treating RN: ?[birth year] (81 y.o. [name], [name] ?Primary Care Akiba Melfi: [name] ?Other Clinician: [name] ?Referring Kazden Largo: ?Treating Letisha Yera/[name]: [name] ?[name] ?Weeks in Treatment: 30 ?Vital Signs ?Time Taken: 12:54 ?Temperature (??F): 98.1 ?Height (in): 64 ?Pulse (bpm): 75 ?Weight (lbs): 135 ?Respiratory Rate (breaths/min): 14 ?Body Mass Index (BMI): 23.2 ?Blood Pressure (mmHg): 144/62 ?Capillary Blood Glucose (mg/dl): 132 ?Reference Range: 80 - 120 mg / dl ?Notes ?Paper version used prior to treatment. ?Electronic Signature(s) ?Signed: 01/11/2022 1:49:26 PM By: [name] EMT ?Entered By: [name] on 01/11/2022 13:49:25 ?

## 2022-01-11 NOTE — Progress Notes (Addendum)
RUBEN, PYKA ([id number]) ?Visit Report for 01/11/2022 ?HBO Details ?Patient Name: Date of Service: ?[name], [name] 01/11/2022 12:30 PM ?Medical Record Number: [id number] ?Patient Account Number: [id number] ?Date of Birth/Sex: Treating RN: ?[birth year] (81 y.o. 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 ?HBO Treatment Details ?Treatment Number: 44 ?Patient Type: Outpatient ?Chamber Type: Monoplace ?Chamber Serial #: [id number] ?Treatment Protocol: 2.0 ATA with 90 minutes oxygen, and no air breaks ?Treatment Details ?Compression Rate Down: 2.0 psi / minute ?De-Compression Rate Up: 2.0 psi / minute ?Air ?breaks ?and ?breathing Decompress Decompress ?Compress Tx Pressure ?Begins Reached periods Begins Ends ?(leave ?unused ?spaces ?blank) ?Chamber Pressure (ATA 1 2 ------2 1 ?) ?Clock Time (24 hr) 13:04 13:11 - - - - - - 14:42 14:50 ?Treatment Length: 106 (minutes) ?Treatment Segments: 4 ?Vital Signs ?Capillary Blood Glucose Reference Range: 80 - 120 mg / dl ?HBO Diabetic Blood Glucose Intervention Range: <131 mg/dl or >249 mg/dl ?Time Vitals Blood Respiratory Capillary Blood Glucose Pulse Action ?Type: ?Pulse: Temperature: ?Taken: ?Pressure: ?Rate: ?Glucose (mg/dl): ?Meter #: Oximetry (%) Taken: ?Pre 12:54 144/62 75 14 98.1 132 ?Post 14:58 132/64 72 14 98.6 117 ?Treatment Response ?Treatment Toleration: Well ?Treatment Completion Status: Treatment Completed without Adverse  Event ?Treatment Notes ?The patient's CBG was 132. The patient asked if she could have a Glucerna. I gave the patient 8oz Glucerna. She had no problem with compression today. She ?did well today with her treatment. ?Additional Procedure Documentation ?Tissue Sevierity: Limited to breakdown of skin ?Physician HBO Attestation: ?I certify that I supervised this HBO treatment in accordance with Medicare ?guidelines. A trained emergency response team is readily available per Yes ?hospital policies and procedures. ?Continue HBOT as ordered. Yes ?Electronic Signature(s) ?Signed: 01/11/2022 3:54:18 PM By: [name] MD FACS ?Previous Signature: 01/11/2022 3:02:30 PM Version By: [name] EMT ?Previous Signature: 01/11/2022 3:00:45 PM Version By: [name] EMT ?Previous Signature: 01/11/2022 2:42:48 PM Version By: [name] EMT ?Entered By: [name] on 01/11/2022 15:54:17 ?-------------------------------------------------------------------------------- ?HBO Safety Checklist Details ?Patient Name: ?Date of Service: ?[name], [name] 01/11/2022 12:30 PM ?Medical Record Number: [id number] ?Patient Account Number: [id number] ?Date of Birth/Sex: ?Treating RN: ?25-Feb-1941 (81 y.o. [name], [name] ?Primary Care Clayton Jarmon: [name] ?Other Clinician: [name] ?Referring Morgin Halls: ?Treating Lennis Rader/[name]: [name] ?[name] ?Weeks in Treatment: 30 ?HBO Safety Checklist Items ?Safety Checklist ?Consent Form Signed ?Patient voided / foley secured and emptied ?When did you last eato 1145 ?Last dose of injectable or oral agent 0830 ?Ostomy pouch emptied and vented if applicable ?NA ?All implantable devices assessed, documented and approved ?NA ?Intravenous access site secured and place ?NA ?Valuables secured ?Linens and cotton and cotton/polyester blend (less than 51% polyester) ?Personal oil-based products / skin lotions / body lotions removed ?Wigs or hairpieces removed  Patient removed ?Smoking or tobacco materials removed ?Books / newspapers / magazines / loose paper removed ?Cologne, aftershave, perfume and deodorant removed ?Jewelry removed (may wrap wedding band) ?NA ?Make-up removed ?Hair care products removed ?Battery operated devices (external) removed ?Heating patches and chemical warmers removed ?Titanium eyewear removed ?NA ?Nail polish  cured greater than 10 hours 2 weeks ago ?Casting material cured greater than 10 hours ?NA ?Hearing aids removed ?NA ?Loose dentures or partials removed ?NA ?Prosthetics have been removed ?NA ?Patient demonstrates correct use of air break device (if applicable) ?Patient concerns have been addressed ?Patient grounding bracelet on and cord attached to chamber ?Specifics for Inpatients (complete in addition to above) ?Medication sheet sent with patient ?NA ?Intravenous medications needed or due during therapy sent with patient ?NA ?Drainage tubes (e.g. nasogastric tube or chest tube secured and vented) ?NA ?Endotracheal or Tracheotomy tube secured ?NA ?Cuff deflated of air and inflated with saline ?NA ?Airway suctioned ?NA ?Notes ?Paper version used prior to treatment. The checklist was done before treatment started. ?Electronic Signature(s) ?Signed: 01/11/2022 1:51:51 PM By: [name] EMT ?Entered By: [name] on 01/11/2022 13:51:51 ?

## 2022-01-11 NOTE — Progress Notes (Signed)
[name], [name] ([id number]) ?Visit Report for 01/11/2022 ?Problem List Details ?Patient Name: Date of Service: ?[name], [name] 01/11/2022 12:30 PM ?Medical Record Number: [id number] ?Patient Account Number: [id number] ?Date of Birth/Sex: Treating RN: ?1940/12/10 (81 y.o. [name], [name] ?Primary Care Provider: [name] ?Other Clinician: [name] ?Referring Provider: ?Treating Provider/[name]: [name] ?[name] ?Weeks in Treatment: 30 ?Active Problems ?ICD-10 ?Encounter ?Code Description Active Date MDM ?Diagnosis ?E11.621 Type 2 diabetes mellitus with foot ulcer 06/15/2021 No Yes ?T81.31XD Disruption of external operation (surgical) wound, not elsewhere classified, 06/15/2021 No Yes ?subsequent encounter ?L97.514 Non-pressure chronic ulcer of other part of right foot with necrosis of bone 09/20/2021 No Yes ?E11.51 Type 2 diabetes mellitus with diabetic peripheral angiopathy without gangrene 06/15/2021 No Yes ?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, right ankle and foot 10/25/2021 No Yes ?Inactive Problems ?ICD-10 ?Code Description Active Date Inactive Date ?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 01/11/2022 ?Medical Record Number: [id number] ?Patient Account Number: [id number] ?Date of Birth/Sex: ?Treating RN: ?08-27-1941 (81 y.o. 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 gangrene ?I69.629 Other chronic osteomyelitis, right ankle and foot ?Facility Procedures ?CPT4 Code: 52841324 ?Description: G0277-(Facility Use Only) HBOT full body chamber, 61mn , ICD-10 Diagnosis Description 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 M86.671 Other  ?chronic osteomyelitis, right ankle and foot T81.31XD Disruption of external operation (surgical) wound, not elsewhere classified, subse ?Modifier: quent encounter ?Quantity: 4 ?Physician Procedures ?: CPT4 Code Description Modifier [id number] 53664- WC PHYS HYPERBARIC OXYGEN THERAPY ICD-10 Diagnosis Description 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

## 2022-01-12 ENCOUNTER — Encounter (HOSPITAL_BASED_OUTPATIENT_CLINIC_OR_DEPARTMENT_OTHER): Payer: Medicare Other | Admitting: General Surgery

## 2022-01-12 DIAGNOSIS — E11621 Type 2 diabetes mellitus with foot ulcer: Secondary | ICD-10-CM | POA: Diagnosis not present

## 2022-01-12 DIAGNOSIS — L97512 Non-pressure chronic ulcer of other part of right foot with fat layer exposed: Secondary | ICD-10-CM | POA: Diagnosis not present

## 2022-01-12 DIAGNOSIS — L97514 Non-pressure chronic ulcer of other part of right foot with necrosis of bone: Secondary | ICD-10-CM | POA: Diagnosis not present

## 2022-01-12 DIAGNOSIS — Z4781 Encounter for orthopedic aftercare following surgical amputation: Secondary | ICD-10-CM | POA: Diagnosis not present

## 2022-01-12 DIAGNOSIS — Z89412 Acquired absence of left great toe: Secondary | ICD-10-CM | POA: Diagnosis not present

## 2022-01-12 DIAGNOSIS — E1151 Type 2 diabetes mellitus with diabetic peripheral angiopathy without gangrene: Secondary | ICD-10-CM | POA: Diagnosis not present

## 2022-01-12 DIAGNOSIS — T8131XD Disruption of external operation (surgical) wound, not elsewhere classified, subsequent encounter: Secondary | ICD-10-CM | POA: Diagnosis not present

## 2022-01-12 DIAGNOSIS — D638 Anemia in other chronic diseases classified elsewhere: Secondary | ICD-10-CM | POA: Diagnosis not present

## 2022-01-12 DIAGNOSIS — M86671 Other chronic osteomyelitis, right ankle and foot: Secondary | ICD-10-CM | POA: Diagnosis not present

## 2022-01-12 DIAGNOSIS — I10 Essential (primary) hypertension: Secondary | ICD-10-CM | POA: Diagnosis not present

## 2022-01-12 DIAGNOSIS — E11628 Type 2 diabetes mellitus with other skin complications: Secondary | ICD-10-CM | POA: Diagnosis not present

## 2022-01-12 DIAGNOSIS — I70202 Unspecified atherosclerosis of native arteries of extremities, left leg: Secondary | ICD-10-CM | POA: Diagnosis not present

## 2022-01-12 LAB — GLUCOSE, CAPILLARY
Glucose-Capillary: 124 mg/dL — ABNORMAL HIGH (ref 70–99)
Glucose-Capillary: 131 mg/dL — ABNORMAL HIGH (ref 70–99)
Glucose-Capillary: 106 mg/dL — ABNORMAL HIGH (ref 70–99)

## 2022-01-12 NOTE — Progress Notes (Signed)
CAITRIONA, SUNDQUIST ([id number]) ?Visit Report for 01/12/2022 ?SuperBill Details ?Patient Name: Date of Service: ?[name], [name] 01/12/2022 ?Medical Record Number: [id number] ?Patient Account Number: [id number] ?Date of Birth/Sex: Treating RN: ?1941-04-28 (81 y.o. 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 gangrene ?S97.026 Other chronic osteomyelitis, right ankle and foot ?Facility Procedures ?CPT4 Code Description Modifier Quantity ?[id number] G0277-(Facility Use Only) HBOT full body chamber, 82mn ?, 4 ?ICD-10 Diagnosis Description ?E11.621 Type 2 diabetes mellitus with foot ulcer ?L97.514 Non-pressure chronic ulcer of other part of right foot with necrosis of bone ?MY77.412Other chronic osteomyelitis, right ankle and foot ?T81.31XD Disruption of external operation (surgical) wound, not elsewhere classified, subsequent encounter ?Physician Procedures ?Quantity ?CPT4 Code Description Modifier ?[id number]- WC PHYS HYPERBARIC OXYGEN THERAPY 1 ?ICD-10 Diagnosis Description ?E11.621 Type 2 diabetes mellitus with foot ulcer ?L97.514 Non-pressure chronic ulcer of other part of right foot with necrosis of bone ?MS96.283Other chronic osteomyelitis, right ankle and foot ?T81.31XD Disruption of external operation (surgical) wound, not elsewhere classified, subsequent encounter ?Electronic Signature(s) ?Signed: 01/12/2022 4:01:59 PM By: SDonavan BurnetCHT EMT BS ?, , ?Signed: 01/12/2022 4:58:30 PM By:  CFredirick MaudlinMD FACS ?Entered By: SDonavan Burneton 01/12/2022 16:01:58 ?

## 2022-01-15 ENCOUNTER — Encounter (HOSPITAL_BASED_OUTPATIENT_CLINIC_OR_DEPARTMENT_OTHER): Payer: Medicare Other | Admitting: Internal Medicine

## 2022-01-15 DIAGNOSIS — D638 Anemia in other chronic diseases classified elsewhere: Secondary | ICD-10-CM | POA: Diagnosis not present

## 2022-01-15 DIAGNOSIS — Z20822 Contact with and (suspected) exposure to covid-19: Secondary | ICD-10-CM | POA: Diagnosis not present

## 2022-01-15 DIAGNOSIS — M86671 Other chronic osteomyelitis, right ankle and foot: Secondary | ICD-10-CM

## 2022-01-15 DIAGNOSIS — Z89412 Acquired absence of left great toe: Secondary | ICD-10-CM | POA: Diagnosis not present

## 2022-01-15 DIAGNOSIS — T8131XD Disruption of external operation (surgical) wound, not elsewhere classified, subsequent encounter: Secondary | ICD-10-CM | POA: Diagnosis not present

## 2022-01-15 DIAGNOSIS — I70202 Unspecified atherosclerosis of native arteries of extremities, left leg: Secondary | ICD-10-CM | POA: Diagnosis not present

## 2022-01-15 DIAGNOSIS — E11628 Type 2 diabetes mellitus with other skin complications: Secondary | ICD-10-CM | POA: Diagnosis not present

## 2022-01-15 DIAGNOSIS — L97514 Non-pressure chronic ulcer of other part of right foot with necrosis of bone: Secondary | ICD-10-CM | POA: Diagnosis not present

## 2022-01-15 DIAGNOSIS — E1151 Type 2 diabetes mellitus with diabetic peripheral angiopathy without gangrene: Secondary | ICD-10-CM | POA: Diagnosis not present

## 2022-01-15 DIAGNOSIS — E11621 Type 2 diabetes mellitus with foot ulcer: Secondary | ICD-10-CM | POA: Diagnosis not present

## 2022-01-15 DIAGNOSIS — Z4781 Encounter for orthopedic aftercare following surgical amputation: Secondary | ICD-10-CM | POA: Diagnosis not present

## 2022-01-15 DIAGNOSIS — I10 Essential (primary) hypertension: Secondary | ICD-10-CM | POA: Diagnosis not present

## 2022-01-15 LAB — GLUCOSE, CAPILLARY
Glucose-Capillary: 182 mg/dL — ABNORMAL HIGH (ref 70–99)
Glucose-Capillary: 102 mg/dL — ABNORMAL HIGH (ref 70–99)

## 2022-01-15 NOTE — Progress Notes (Signed)
ANTONIQUE, LANGFORD ([id number]) ?Visit Report for 01/15/2022 ?HBO Details ?Patient Name: Date of Service: ?[name], [name] 01/15/2022 12:30 PM ?Medical Record Number: [id number] ?Patient Account Number: [id number] ?Date of Birth/Sex: Treating RN: ?[birth year] (81 y.o. 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 ?HBO Treatment Details ?Treatment Number: 27 ?Patient Type: Outpatient ?Chamber Type: Monoplace ?Chamber Serial #: [id number] ?Treatment Protocol: 2.0 ATA with 90 minutes oxygen, and no air breaks ?Treatment Details ?Compression Rate Down: 2.0 psi / minute ?De-Compression Rate Up: 2.0 psi / minute ?Air ?breaks ?and ?breathing Decompress Decompress ?Compress Tx Pressure ?Begins Reached periods Begins Ends ?(leave ?unused ?spaces ?blank) ?Chamber Pressure (ATA 1 2 ------2 1 ?) ?Clock Time (24 hr) 13:03 13:12 - - - - - - 14:42 14:51 ?Treatment Length: 108 (minutes) ?Treatment Segments: 4 ?Vital Signs ?Capillary Blood Glucose Reference Range: 80 - 120 mg / dl ?HBO Diabetic Blood Glucose Intervention Range: <131 mg/dl or >249 mg/dl ?Time Vitals Blood Respiratory Capillary Blood Glucose Pulse Action ?Type: ?Pulse: Temperature: ?Taken: ?Pressure: ?Rate: ?Glucose (mg/dl): ?Meter #: Oximetry (%) Taken: ?Pre 12:58 134/58 75 18 97.7 182 2 100 ?Post 14:54 134/60 76 18 98.1 102 2 100 ?Treatment Response ?Treatment Toleration: Well ?Treatment Completion Status: Treatment Completed without  Adverse Event ?Additional Procedure Documentation ?Tissue Sevierity: Fat layer exposed ?Physician HBO Attestation: ?I certify that I supervised this HBO treatment in accordance with Medicare ?guidelines. A trained emergency response team is readily available per Yes ?hospital policies and procedures. ?Continue HBOT as ordered. Yes ?Electronic Signature(s) ?Signed: 01/15/2022 3:53:12 PM By: [name] DO ?Signed: 01/15/2022 3:53:12 PM By: [name] DO ?Previous Signature: 01/15/2022 3:44:59 PM Version By: [name] CHT EMT BS ?, , ?Entered By: [name] on 01/15/2022 15:52:03 ?-------------------------------------------------------------------------------- ?HBO Safety Checklist Details ?Patient Name: ?Date of Service: ?[name], [name] 01/15/2022 12:30 PM ?Medical Record Number: [id number] ?Patient Account Number: [id number] ?Date of Birth/Sex: ?Treating RN: ?[birth year] (82 y.o. [name], [name] ?Primary Care Jasie Meleski: [name] ?Other Clinician: [name] ?Referring Merlyn Bollen: ?Treating Thekla Colborn/[name]: [name] ?[name] ?Weeks in Treatment: 30 ?HBO Safety Checklist Items ?Safety Checklist ?Consent Form Signed ?Patient voided / foley secured and emptied ?When did you last eato 0830 ?Last dose of injectable or oral agent 0830 ?Ostomy pouch emptied and vented if applicable ?NA ?All implantable devices assessed, documented and approved ?NA ?Intravenous access site secured and place ?NA ?Valuables secured ?Linens and cotton and cotton/polyester blend (less than 51% polyester) ?Personal oil-based products / skin lotions / body lotions removed ?NA ?Wigs or hairpieces removed Patient removed item. ?Smoking or tobacco materials removed ?NA ?Books / newspapers / magazines / loose paper removed ?Cologne, aftershave, perfume and deodorant removed ?Jewelry removed (may wrap wedding band) ?Make-up removed ?Hair care products removed ?Battery operated devices  (external) removed ?Heating patches and chemical warmers removed ?Titanium eyewear removed ?NA ?Nail polish cured greater than 10 hours ?NA ?Casting material cured greater than 10 hours ?NA ?Hearing aids removed ?NA ?Loose dentures or partials removed ?NA ?Prosthetics have been removed ?NA ?Patient demonstrates correct use of air break device (if applicable) ?Patient concerns  have been addressed ?Patient grounding bracelet on and cord attached to chamber ?Specifics for Inpatients (complete in addition to above) ?Medication sheet sent with patient ?NA ?Intravenous medications needed or due during therapy sent with patient ?NA ?Drainage tubes (e.g. nasogastric tube or chest tube secured and vented) ?NA ?Endotracheal or Tracheotomy tube secured ?NA ?Cuff deflated of air and inflated with saline ?NA ?Airway suctioned ?NA ?Notes ?Paper version used prior to treatment. ?Electronic Signature(s) ?Signed: 01/15/2022 3:44:59 PM By: [name] CHT EMT BS ?, , ?Entered By: [name] on 01/15/2022 13:40:47 ?

## 2022-01-15 NOTE — Progress Notes (Signed)
JAIRY, ANGULO ([id number]) ?Visit Report for 01/15/2022 ?SuperBill Details ?Patient Name: Date of Service: ?[name], [name] 01/15/2022 ?Medical Record Number: [id number] ?Patient Account Number: [id number] ?Date of Birth/Sex: Treating RN: ?13-Jan-1941 (81 y.o. 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, right ankle and foot ?Facility Procedures ?CPT4 Code Description Modifier Quantity ?[id number] G0277-(Facility Use Only) HBOT full body chamber, 69mn ?, 4 ?ICD-10 Diagnosis Description ?E11.621 Type 2 diabetes mellitus with foot ulcer ?L97.514 Non-pressure chronic ulcer of other part of right foot with necrosis of bone ?MQ46.962Other chronic osteomyelitis, right ankle and foot ?T81.31XD Disruption of external operation (surgical) wound, not elsewhere classified, subsequent encounter ?Physician Procedures ?Quantity ?CPT4 Code Description Modifier ?[id number]- WC PHYS HYPERBARIC OXYGEN THERAPY 1 ?ICD-10 Diagnosis Description ?E11.621 Type 2 diabetes mellitus with foot ulcer ?L97.514 Non-pressure chronic ulcer of other part of right foot with necrosis of bone ?MU27.253Other chronic osteomyelitis, right ankle and foot ?T81.31XD Disruption of external operation (surgical) wound, not elsewhere classified, subsequent encounter ?Electronic Signature(s) ?Signed: 01/15/2022 3:44:59 PM By: SDonavan BurnetCHT EMT BS ?, , ?Signed: 01/15/2022 3:53:12 PM By:  HKalman ShanDO ?Entered By: SDonavan Burneton 01/15/2022 15:39:43 ?

## 2022-01-15 NOTE — Progress Notes (Signed)
[name], SHANAFELT ([id number]) ?Visit Report for 01/12/2022 ?Arrival Information Details ?Patient Name: Date of Service: ?[name], [name] 01/12/2022 12:30 PM ?Medical Record Number: [id number] ?Patient Account Number: [id number] ?Date of Birth/Sex: Treating RN: ?1941/08/12 (81 y.o. [name], [name] ?Primary Care Yang Rack: [name] ?Other Clinician: [name] ?Referring Melaine Mcphee: ?Treating Algenis Ballin/[name]: [name] ?[name] ?Weeks in Treatment: 30 ?Visit Information History Since Last Visit ?All ordered tests and consults were completed: Yes ?Patient Arrived: [name] ?Added or deleted any medications: No ?Arrival Time: 12:42 ?Any new allergies or adverse reactions: No ?Accompanied By: self ?Had a fall or experienced change in No ?Transfer Assistance: None ?activities of daily living that may affect ?Patient Identification Verified: Yes ?risk of falls: ?Secondary Verification Process Completed: Yes ?Signs or symptoms of abuse/neglect since last visito No ?Patient Requires Transmission-Based Precautions: No ?Hospitalized since last visit: No ?Patient Has Alerts: Yes ?Implantable device outside of the clinic excluding No ?Patient Alerts: Patient on Blood Thinner cellular tissue based products placed in the center ?since last visit: ?Pain Present Now: No ?Electronic Signature(s) ?Signed: 01/15/2022 3:44:59 PM By: [name] CHT EMT BS ?, , ?Entered By: [name] on 01/12/2022 13:47:10 ?-------------------------------------------------------------------------------- ?Encounter Discharge Information Details ?Patient Name: Date of Service: ?[name], [name] 01/12/2022 12:30 PM ?Medical Record Number: [id number] ?Patient Account Number: [id number] ?Date of Birth/Sex: Treating RN: ?1941-03-16 (81 y.o. [name], [name] ?Primary Care Aloni Chuang: [name] ?Other Clinician: [name] ?Referring Weslie Rasmus: ?Treating Rajon Bisig/[name]: [name] ?[name] ?Weeks in Treatment: 30 ?Encounter Discharge Information Items ?Discharge Condition: Stable ?Ambulatory Status: [name] ?Discharge Destination: Home ?Transportation: Private Auto ?Accompanied By: self ?Schedule Follow-up Appointment: No ?Clinical Summary of Care: ?Electronic Signature(s) ?Signed: 01/12/2022 4:02:26 PM By: [name] CHT EMT BS ?, , ?Entered By: [name] on 01/12/2022 16:02:26 ?-------------------------------------------------------------------------------- ?Vitals Details ?Patient Name: ?Date of Service: ?[name], [name] 01/12/2022 12:30 PM ?Medical Record Number: [id number] ?Patient Account Number: [id number] ?Date of Birth/Sex: ?Treating RN: ?07-17-1941 (81 y.o. [name], [name] ?Primary Care [name]: [name] ?Other Clinician: [name] ?Referring Meleah Demeyer: ?Treating Shaniquia Brafford/[name]: [name] ?[name] ?Weeks in Treatment: 30 ?Vital Signs ?Time Taken: 13:01 ?Temperature (??F): 97.8 ?Height (in): 64 ?Pulse (bpm): 72 ?Weight (lbs): 135 ?Respiratory Rate (breaths/min): 16 ?Body Mass Index (BMI): 23.2 ?Blood Pressure (mmHg): 142/52 ?Capillary Blood Glucose (mg/dl): 124 ?Reference Range: 80 - 120 mg / dl ?Electronic Signature(s) ?Signed: 01/15/2022 3:44:59 PM By: [name] CHT EMT BS ?, , ?Entered By: [name] on 01/12/2022 13:47:33 ?

## 2022-01-15 NOTE — Progress Notes (Signed)
ROGUE, PAUTLER ([id number]) ?Visit Report for 01/12/2022 ?HBO Details ?Patient Name: Date of Service: ?[name], [name] 01/12/2022 12:30 PM ?Medical Record Number: [id number] ?Patient Account Number: [id number] ?Date of Birth/Sex: Treating RN: ?12/04/1940 (81 y.o. 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 ?HBO Treatment Details ?Treatment Number: 33 ?Patient Type: Outpatient ?Chamber Type: Monoplace ?Chamber Serial #: [id number] ?Treatment Protocol: 2.0 ATA with 90 minutes oxygen, and no air breaks ?Treatment Details ?Compression Rate Down: 2.0 psi / minute ?De-Compression Rate Up: 2.0 psi / minute ?Air ?breaks ?and ?breathing Decompress Decompress ?Compress Tx Pressure ?Begins Reached periods Begins Ends ?(leave ?unused ?spaces ?blank) ?Chamber Pressure (ATA 1 2 ------2 1 ?) ?Clock Time (24 hr) 13:39 13:48 - - - - - - 15:19 15:27 ?Treatment Length: 108 (minutes) ?Treatment Segments: 4 ?Vital Signs ?Capillary Blood Glucose Reference Range: 80 - 120 mg / dl ?HBO Diabetic Blood Glucose Intervention Range: <131 mg/dl or >249 mg/dl ?Time Vitals Blood Respiratory Capillary Blood Glucose Pulse Action ?Type: ?Pulse: Temperature: ?Taken: ?Pressure: ?Rate: ?Glucose (mg/dl): ?Meter #: Oximetry (%) Taken: ?Pre 13:01 142/52 72 16 97.8 124 2 ?Post 15:31 188/45 72 16 97.8 106 2 ?Treatment Response ?Treatment Toleration: Well ?Treatment Completion Status: Treatment Completed without Adverse  Event ?Treatment Notes ?Patient's pre-treatment diastolic BP was 52. Patient was asymptomatic for hypotension. Patient's post-treatment diastolic BP was 765/46. Patient stated she ?felt fine. Patient left prior to a secondary manual BP measurement. ?Additional Procedure Documentation ?Tissue Sevierity: Fat layer exposed ?Physician HBO Attestation: ?I certify that I supervised this HBO treatment in accordance with Medicare ?guidelines. A trained emergency response team is readily available per Yes ?hospital policies and procedures. ?Continue HBOT as ordered. Yes ?Electronic Signature(s) ?Signed: 01/12/2022 4:56:40 PM By: [name] MD FACS ?Previous Signature: 01/12/2022 4:01:07 PM Version By: [name] CHT EMT BS ?, , ?Entered By: [name] on 01/12/2022 16:56:39 ?-------------------------------------------------------------------------------- ?HBO Safety Checklist Details ?Patient Name: ?Date of Service: ?[name], [name] 01/12/2022 12:30 PM ?Medical Record Number: [id number] ?Patient Account Number: [id number] ?Date of Birth/Sex: ?Treating RN: ?December 10, 1940 (81 y.o. [name], [name] ?Primary Care Izabela Ow: [name] ?Other Clinician: [name] ?Referring Tamu Golz: ?Treating Tiah Heckel/[name]: [name] ?[name] ?Weeks in Treatment: 30 ?HBO Safety Checklist Items ?Safety Checklist ?Consent Form Signed ?Patient voided / foley secured and emptied ?When did you last eato 0830 ?Last dose of injectable or oral agent 0830 ?Ostomy pouch emptied and vented if applicable ?NA ?All implantable devices assessed, documented and approved ?NA ?Intravenous access site secured and place ?NA ?Valuables secured ?Linens and cotton and cotton/polyester blend (less than 51% polyester) ?Personal oil-based products / skin lotions / body lotions removed ?Wigs or hairpieces removed Patient removed item. ?Smoking or tobacco materials removed ?NA ?Books / newspapers / magazines / loose paper  removed ?Cologne, aftershave, perfume and deodorant removed ?Jewelry removed (may wrap wedding band) ?Make-up removed ?Hair care products removed ?Battery operated devices (external) removed ?Heating patches and chemical warmers removed ?Titanium eyewear removed ?NA ?Nail polish cured greater than 10 hours ?Casting material cured greater than 10 hours ?NA ?Hearing aids removed ?NA ?Loose  dentures or partials removed ?NA ?Prosthetics have been removed ?NA ?Patient demonstrates correct use of air break device (if applicable) ?Patient concerns have been addressed ?Patient grounding bracelet on and cord attached to chamber ?Specifics for Inpatients (complete in addition to above) ?Medication sheet sent with patient ?NA ?Intravenous medications needed or due during therapy sent with patient ?NA ?Drainage tubes (e.g. nasogastric tube or chest tube secured and vented) ?NA ?Endotracheal or Tracheotomy tube secured ?NA ?Cuff deflated of air and inflated with saline ?NA ?Airway suctioned ?NA ?Notes ?Paper version used prior to treatment. ?Electronic Signature(s) ?Signed: 01/15/2022 3:44:59 PM By: [name] CHT EMT BS ?, , ?Entered By: [name] on 01/12/2022 13:49:25 ?

## 2022-01-15 NOTE — Progress Notes (Signed)
[name], [name] ([id number]) ?Visit Report for 01/15/2022 ?Arrival Information Details ?Patient Name: Date of Service: ?[name], [name] 01/15/2022 12:30 PM ?Medical Record Number: [id number] ?Patient Account Number: [id number] ?Date of Birth/Sex: Treating RN: ?1941/06/27 (81 y.o. F) ?Primary Care Treasure Ochs: [name] Other Clinician: ?Referring Jessicalynn Deshong: ?Treating Daviona Herbert/[name]: [name] ?[name] ?Weeks in Treatment: 30 ?Visit Information History Since Last Visit ?All ordered tests and consults were completed: Yes ?Patient Arrived: [name] ?Added or deleted any medications: No ?Arrival Time: 12:42 ?Any new allergies or adverse reactions: No ?Accompanied By: self ?Had a fall or experienced change in No ?Transfer Assistance: None ?activities of daily living that may affect ?Patient Identification Verified: Yes ?risk of falls: ?Secondary Verification Process Completed: Yes ?Signs or symptoms of abuse/neglect since last visito No ?Patient Requires Transmission-Based Precautions: No ?Hospitalized since last visit: No ?Patient Has Alerts: Yes ?Implantable device outside of the clinic excluding No ?Patient Alerts: Patient on Blood Thinner cellular tissue based products placed in the center ?since last visit: ?Pain Present Now: No ?Electronic Signature(s) ?Signed: 01/15/2022 3:44:59 PM By: [name] CHT EMT BS ?, , ?Entered By: [name] on 01/15/2022 13:37:31 ?-------------------------------------------------------------------------------- ?Encounter Discharge Information Details ?Patient Name: Date of Service: ?[name], [name] 01/15/2022 12:30 PM ?Medical Record Number: [id number] ?Patient Account Number: [id number] ?Date of Birth/Sex: Treating RN: ?September 17, 1941 (81 y.o. [name], [name] ?Primary Care Makayli Bracken: [name] ?Other Clinician: [name] ?Referring Josemiguel Gries: ?Treating Elliette Seabolt/[name]: [name] ?[name] ?Weeks in Treatment:  30 ?Encounter Discharge Information Items ?Discharge Condition: Stable ?Ambulatory Status: Ambulatory ?Discharge Destination: Home ?Transportation: Private Auto ?Accompanied By: self ?Schedule Follow-up Appointment: No ?Clinical Summary of Care: ?Electronic Signature(s) ?Signed: 01/15/2022 3:44:59 PM By: [name] CHT EMT BS ?, , ?Entered By: [name] on 01/15/2022 15:40:31 ?-------------------------------------------------------------------------------- ?Vitals Details ?Patient Name: ?Date of Service: ?[name], [name] 01/15/2022 12:30 PM ?Medical Record Number: [id number] ?Patient Account Number: [id number] ?Date of Birth/Sex: ?Treating RN: ?[birth year] (81 y.o. [name], [name] ?Primary Care Keyuna Cuthrell: [name] ?Other Clinician: [name] ?Referring Nitin Mckowen: ?Treating Penelopi Mikrut/[name]: [name] ?[name] ?Weeks in Treatment: 30 ?Vital Signs ?Time Taken: 12:58 ?Temperature (??F): 97.7 ?Height (in): 64 ?Pulse (bpm): 75 ?Weight (lbs): 135 ?Respiratory Rate (breaths/min): 18 ?Body Mass Index (BMI): 23.2 ?Blood Pressure (mmHg): 134/58 ?Capillary Blood Glucose (mg/dl): 182 ?Reference Range: 80 - 120 mg / dl ?Airway ?Pulse Oximetry (%): 100 ?Electronic Signature(s) ?Signed: 01/15/2022 3:44:59 PM By: [name] CHT EMT BS ?, , ?Entered By: [name] on 01/15/2022 15:34:16 ?

## 2022-01-16 ENCOUNTER — Encounter (HOSPITAL_BASED_OUTPATIENT_CLINIC_OR_DEPARTMENT_OTHER): Payer: Medicare Other | Admitting: Internal Medicine

## 2022-01-16 ENCOUNTER — Other Ambulatory Visit: Payer: Self-pay | Admitting: Vascular Surgery

## 2022-01-16 ENCOUNTER — Other Ambulatory Visit: Payer: Self-pay | Admitting: Endocrinology

## 2022-01-16 DIAGNOSIS — T8131XD Disruption of external operation (surgical) wound, not elsewhere classified, subsequent encounter: Secondary | ICD-10-CM | POA: Diagnosis not present

## 2022-01-16 DIAGNOSIS — E1151 Type 2 diabetes mellitus with diabetic peripheral angiopathy without gangrene: Secondary | ICD-10-CM | POA: Diagnosis not present

## 2022-01-16 DIAGNOSIS — L97514 Non-pressure chronic ulcer of other part of right foot with necrosis of bone: Secondary | ICD-10-CM | POA: Diagnosis not present

## 2022-01-16 DIAGNOSIS — M86671 Other chronic osteomyelitis, right ankle and foot: Secondary | ICD-10-CM | POA: Diagnosis not present

## 2022-01-16 DIAGNOSIS — E11621 Type 2 diabetes mellitus with foot ulcer: Secondary | ICD-10-CM

## 2022-01-16 LAB — GLUCOSE, CAPILLARY
Glucose-Capillary: 174 mg/dL — ABNORMAL HIGH (ref 70–99)
Glucose-Capillary: 119 mg/dL — ABNORMAL HIGH (ref 70–99)

## 2022-01-16 NOTE — Progress Notes (Addendum)
REEDA, SOOHOO ([id number]) ?Visit Report for 01/16/2022 ?SuperBill Details ?Patient Name: Date of Service: ?[name], [name] 01/16/2022 ?Medical Record Number: [id number] ?Patient Account Number: [id number] ?Date of Birth/Sex: Treating RN: ?March 29, 1941 (81 y.o. 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, right ankle and foot ?Facility Procedures ?CPT4 Code Description Modifier Quantity ?63893734 G0277-(Facility Use Only) HBOT full body chamber, 62mn ?, 2 ?ICD-10 Diagnosis Description ?E11.621 Type 2 diabetes mellitus with foot ulcer ?L97.514 Non-pressure chronic ulcer of other part of right foot with necrosis of bone ?MK87.681Other chronic osteomyelitis, right ankle and foot ?T81.31XD Disruption of external operation (surgical) wound, not elsewhere classified, subsequent encounter ?Physician Procedures ?Quantity ?CPT4 Code Description Modifier ?[id number] WC PHYS HYPERBARIC OXYGEN THERAPY 1 ?ICD-10 Diagnosis Description ?E11.621 Type 2 diabetes mellitus with foot ulcer ?L97.514 Non-pressure chronic ulcer of other part of right foot with necrosis of bone ?MC16.384Other chronic osteomyelitis, right ankle and foot ?T81.31XD Disruption of external operation (surgical) wound, not elsewhere classified, subsequent encounter ?Electronic Signature(s) ?Signed: 01/17/2022 11:34:19 AM By: SDonavan BurnetCHT EMT BS ?, , ?Signed: 01/22/2022 10:11:54 AM  By: HKalman ShanDO ?Previous Signature: 01/16/2022 3:28:15 PM Version By: SDonavan BurnetCHT EMT BS ?, , ?Previous Signature: 01/16/2022 4:33:51 PM Version By: HKalman ShanDO ?Entered By: SDonavan Burneton 01/17/2022 11:34:18 ?

## 2022-01-17 ENCOUNTER — Encounter (HOSPITAL_BASED_OUTPATIENT_CLINIC_OR_DEPARTMENT_OTHER): Payer: Medicare Other | Admitting: General Surgery

## 2022-01-17 DIAGNOSIS — E11621 Type 2 diabetes mellitus with foot ulcer: Secondary | ICD-10-CM | POA: Diagnosis not present

## 2022-01-17 DIAGNOSIS — E1151 Type 2 diabetes mellitus with diabetic peripheral angiopathy without gangrene: Secondary | ICD-10-CM | POA: Diagnosis not present

## 2022-01-17 DIAGNOSIS — M86671 Other chronic osteomyelitis, right ankle and foot: Secondary | ICD-10-CM | POA: Diagnosis not present

## 2022-01-17 DIAGNOSIS — L97512 Non-pressure chronic ulcer of other part of right foot with fat layer exposed: Secondary | ICD-10-CM | POA: Diagnosis not present

## 2022-01-17 DIAGNOSIS — T8131XD Disruption of external operation (surgical) wound, not elsewhere classified, subsequent encounter: Secondary | ICD-10-CM | POA: Diagnosis not present

## 2022-01-17 DIAGNOSIS — L97514 Non-pressure chronic ulcer of other part of right foot with necrosis of bone: Secondary | ICD-10-CM | POA: Diagnosis not present

## 2022-01-17 LAB — GLUCOSE, CAPILLARY
Glucose-Capillary: 222 mg/dL — ABNORMAL HIGH (ref 70–99)
Glucose-Capillary: 113 mg/dL — ABNORMAL HIGH (ref 70–99)

## 2022-01-17 NOTE — Progress Notes (Signed)
[name], [name] ([id number]) ?Visit Report for 01/16/2022 ?Arrival Information Details ?Patient Name: Date of Service: ?[name], [name] 01/16/2022 12:30 PM ?Medical Record Number: [id number] ?Patient Account Number: [id number] ?Date of Birth/Sex: Treating RN: ?05-06-1941 (81 y.o. [name], [name] ?Primary Care Dub Maclellan: [name] ?Other Clinician: [name] ?Referring Garett Tetzloff: ?Treating Crystin Lechtenberg/[name]: [name] ?[name] ?Weeks in Treatment: 30 ?Visit Information History Since Last Visit ?All ordered tests and consults were completed: Yes ?Patient Arrived: [name] ?Added or deleted any medications: No ?Arrival Time: 12:34 ?Any new allergies or adverse reactions: No ?Accompanied By: self ?Had a fall or experienced change in No ?Transfer Assistance: None ?activities of daily living that may affect ?Patient Identification Verified: Yes ?risk of falls: ?Secondary Verification Process Completed: Yes ?Signs or symptoms of abuse/neglect since last visito No ?Patient Requires Transmission-Based Precautions: No ?Hospitalized since last visit: No ?Patient Has Alerts: Yes ?Implantable device outside of the clinic excluding No ?Patient Alerts: Patient on Blood Thinner cellular tissue based products placed in the center ?since last visit: ?Pain Present Now: No ?Electronic Signature(s) ?Signed: 01/17/2022 11:39:41 AM By: [name] CHT EMT BS ?, , ?Entered By: [name] on 01/16/2022 13:14:26 ?-------------------------------------------------------------------------------- ?Encounter Discharge Information Details ?Patient Name: Date of Service: ?[name], [name] 01/16/2022 12:30 PM ?Medical Record Number: [id number] ?Patient Account Number: [id number] ?Date of Birth/Sex: Treating RN: ?1940-12-29 (81 y.o. [name], [name] ?Primary Care Salima Rumer: [name] ?Other Clinician: [name] ?Referring Taequan Stockhausen: ?Treating Drury Ardizzone/[name]: [name] ?[name] ?Weeks in Treatment: 30 ?Encounter Discharge Information Items ?Discharge Condition: Stable ?Ambulatory Status: [name] ?Discharge Destination: Home ?Transportation: Private Auto ?Accompanied By: self ?Schedule Follow-up Appointment: No ?Clinical Summary of Care: ?Electronic Signature(s) ?Signed: 01/16/2022 3:28:42 PM By: [name] CHT EMT BS ?, , ?Entered By: [name] on 01/16/2022 15:28:42 ?-------------------------------------------------------------------------------- ?Vitals Details ?Patient Name: ?Date of Service: ?[name], [name] 01/16/2022 12:30 PM ?Medical Record Number: [id number] ?Patient Account Number: [id number] ?Date of Birth/Sex: ?Treating RN: ?28-Sep-1940 (81 y.o. [name], [name] ?Primary Care Ugochukwu Chichester: [name] ?Other Clinician: [name] ?Referring Renelda Kilian: ?Treating Afsheen Antony/[name]: [name] ?[name] ?Weeks in Treatment: 30 ?Vital Signs ?Time Taken: 12:56 ?Temperature (??F): 97.9 ?Height (in): 64 ?Pulse (bpm): 66 ?Weight (lbs): 135 ?Respiratory Rate (breaths/min): 18 ?Body Mass Index (BMI): 23.2 ?Blood Pressure (mmHg): 132/58 ?Capillary Blood Glucose (mg/dl): 174 ?Reference Range: 80 - 120 mg / dl ?Airway ?Pulse Oximetry (%): 100 ?Electronic Signature(s) ?Signed: 01/17/2022 11:39:41 AM By: [name] CHT EMT BS ?, , ?Entered By: [name] on 01/16/2022 13:16:07 ?

## 2022-01-17 NOTE — Progress Notes (Signed)
CLEOTHA, WHALIN ([id number]) ?Visit Report for 01/17/2022 ?Arrival Information Details ?Patient Name: Date of Service: ?[name], [name] 01/17/2022 3:00 PM ?Medical Record Number: [id number] ?Patient Account Number: [id number] ?Date of Birth/Sex: Treating RN: ?1941-07-11 (81 y.o. [name], [name] ?Primary Care Cassara Nida: [name] Other Clinician: ?Referring Nataliah Hatlestad: ?Treating Jessamy Torosyan/[name]: [name] ?[name] ?Weeks in Treatment: 30 ?Visit Information History Since Last Visit ?Added or deleted any medications: No ?Patient Arrived: [name] ?Any new allergies or adverse reactions: No ?Arrival Time: 15:10 ?Had a fall or experienced change in No ?Accompanied By: alone ?activities of daily living that may affect ?Transfer Assistance: None ?risk of falls: ?Patient Identification Verified: Yes ?Signs or symptoms of abuse/neglect since last visito No ?Secondary Verification Process Completed: Yes ?Hospitalized since last visit: No ?Patient Requires Transmission-Based Precautions: No ?Implantable device outside of the clinic excluding No ?Patient Has Alerts: Yes ?cellular tissue based products placed in the center ?Patient Alerts: Patient on Blood Thinner since last visit: ?Has Dressing in Place as Prescribed: Yes ?Pain Present Now: No ?Electronic Signature(s) ?Signed: 01/17/2022 5:38:47 PM By: [name] RN, BSN ?Entered By: [name] on 01/17/2022 15:11:00 ?-------------------------------------------------------------------------------- ?Encounter Discharge Information Details ?Patient Name: Date of Service: ?[name], [name] 01/17/2022 3:00 PM ?Medical Record Number: [id number] ?Patient Account Number: [id number] ?Date of Birth/Sex: Treating RN: ?[birth year] (81 y.o. [name], [name] ?Primary Care Lavender Stanke: [name] Other Clinician: ?Referring Harlen Danford: ?Treating Rockwell Zentz/[name]: [name] ?[name] ?Weeks in Treatment: 30 ?Encounter Discharge Information  Items Post Procedure Vitals ?Discharge Condition: Stable ?Temperature (F): 97.5 ?Ambulatory Status: [name] ?Pulse (bpm): 67 ?Discharge Destination: Home ?Respiratory Rate (breaths/min): 18 ?Transportation: Private Auto ?Blood Pressure (mmHg): 146/53 ?Accompanied By: self ?Schedule Follow-up Appointment: Yes ?Clinical Summary of Care: Patient Declined ?Electronic Signature(s) ?Signed: 01/17/2022 5:38:47 PM By: [name] RN, BSN ?Entered By: [name] on 01/17/2022 16:02:31 ?-------------------------------------------------------------------------------- ?Lower Extremity Assessment Details ?Patient Name: ?Date of Service: ?[name], [name] 01/17/2022 3:00 PM ?Medical Record Number: [id number] ?Patient Account Number: [id number] ?Date of Birth/Sex: ?Treating RN: ?06-Oct-1940 (81 y.o. [name], [name] ?Primary Care Genetta Fiero: [name] ?Other Clinician: ?Referring Tiburcio Linder: ?Treating Jeston Junkins/[name]: [name] ?[name] ?Weeks in Treatment: 30 ?Edema Assessment ?Assessed: [Left: No] [Right: No] ?Edema: [Left: No] [Right: Yes] ?Calf ?Left: Right: ?Point of Measurement: From Medial Instep 31 cm 32 cm ?Ankle ?Left: Right: ?Point of Measurement: From Medial Instep 19 cm 19 cm ?Vascular Assessment ?Pulses: ?Dorsalis Pedis ?Palpable: [Left:Yes] [Right:Yes] ?Electronic Signature(s) ?Signed: 01/17/2022 5:38:47 PM By: [name] RN, BSN ?Entered By: [name] on 01/17/2022 15:12:00 ?-------------------------------------------------------------------------------- ?Multi Wound Chart Details ?Patient Name: ?Date of Service: ?[name], [name] 01/17/2022 3:00 PM ?Medical Record Number: [id number] ?Patient Account Number: [id number] ?Date of Birth/Sex: ?Treating RN: ?07-May-1941 (81 y.o. F) ?Primary Care Moncia Annas: [name] ?Other Clinician: ?Referring Kenard Morawski: ?Treating Karol Liendo/[name]: [name] ?[name] ?Weeks in Treatment: 30 ?Vital Signs ?Height(in):  64 ?Capillary Blood Glucose(mg/dl): 113 ?Weight(lbs): 135 ?Pulse(bpm): 67 ?Body Mass Index(BMI): 23.2 ?Blood Pressure(mmHg): 146/53 ?Temperature(??F): 97.5 ?Respiratory Rate(breaths/min): 16 ?Photos: [13:Left Amputation Site - Transmetatarsal Right Amputation Site -] [20:No Photos] [N/A:N/A N/A] ?Wound Location: [13:Surgical Injury] [20:Transmetatarsal Gradually Appeared] [N/A:N/A] ?Wounding Event: [13:Diabetic Wound/Ulcer of the Lower] [20:Diabetic Wound/Ulcer of the Lower] [N/A:N/A] ?Primary Etiology: [13:Extremity Open Surgical Wound] [20:Extremity N/A] [N/A:N/A] ?Secondary Etiology: [13:Coronary Artery Disease, Deep Vein Coronary Artery Disease, Deep Vein N/A] ?Comorbid History: [13:Thrombosis, Hypertension, Myocardial Thrombosis, Hypertension, Myocardial Infarction, Peripheral Arterial Disease, Infarction, Peripheral Arterial Disease, Type II Diabetes 05/11/2021] [20:Type II Diabetes 01/17/2022] [N/A:N/A] ?Date  Acquired: [13:30] [20:0] [N/A:N/A] ?Weeks of Treatment: [13:Healed - Epithelialized] [20:Open] [N/A:N/A] ?Wound Status: [13:No] [20:No] [N/A:N/A] ?Wound Recurrence: [13:0x0x0] [20:0.1x0.1x0.1] [N/A:N/A] ?Measurements L x W x D (cm) [13:0] [65:4.650] [N/A:N/A] ?A (cm?) : ?rea [13:0] [20:0.001] [N/A:N/A] ?Volume (cm?) : [13:100.00%] [20:N/A] [N/A:N/A] ?% Reduction in A [13:rea: 100.00%] [20:N/A] [N/A:N/A] ?% Reduction in Volume: [13:Grade 2] [20:Grade 3] [N/A:N/A] ?Classification: [13:N/A] [20:MRI] [N/A:N/A] ?Earleen Newport Verification: [13:Small] [20:None Present] [N/A:N/A] ?Exudate A mount: [13:Serosanguineous] [20:N/A] [N/A:N/A] ?Exudate Type: [13:red, brown] [20:N/A] [N/A:N/A] ?Exudate Color: [13:Distinct, outline attached] [20:Distinct, outline attached] [N/A:N/A] ?Wound Margin: [13:None Present (0%)] [20:Small (1-33%)] [N/A:N/A] ?Granulation A mount: [13:N/A] [20:Pink] [N/A:N/A] ?Granulation Quality: [13:None Present (0%)] [20:None Present (0%)] [N/A:N/A] ?Necrotic A mount: ?[13:Fascia: No] ?[20:Fat Layer  (Subcutaneous Tissue): Yes N/A] ?Exposed Structures: ?[13:Fat Layer (Subcutaneous Tissue): No Fascia: No Tendon: No Muscle: No Joint: No Bone: No Large (67-100%)] [20:Tendon: No Muscle: No Joint: No Bone: No Large (67-100%)] [N/A:N/A] ?Epithelialization: [13:N/A] [20:Debridement - Selective/Open Wound N/A] ?Debridement: ?Pre-procedure Verification/Time Out N/A [20:15:30] [N/A:N/A] ?Taken: [13:N/A] [20:Other] [N/A:N/A] ?Pain Control: [13:N/A] [20:Callus] [N/A:N/A] ?Tissue Debrided: [13:N/A] [20:Skin/Epidermis] [N/A:N/A] ?Level: [13:N/A] [20:0.25] [N/A:N/A] ?Debridement A (sq cm): [13:rea N/A] [20:Curette] [N/A:N/A] ?Instrument: [13:N/A] [20:None] [N/A:N/A] ?Bleeding: [13:N/A] [20:0] [N/A:N/A] ?Procedural Pain: [13:N/A] [20:0] [N/A:N/A] ?Post Procedural Pain: [13:N/A] [20:Procedure was tolerated well] [N/A:N/A] ?Debridement Treatment Response: [13:N/A] [20:0.1x0.1x0.1] [N/A:N/A] ?Post Debridement Measurements L x ?W x D (cm) [13:N/A] [20:0.001] [N/A:N/A] ?Post Debridement Volume: (cm?) [13:N/A] [20:Debridement] [N/A:N/A] ?Treatment Notes ?Wound #13 (Amputation Site - Transmetatarsal) Wound Laterality: Left ?Cleanser ?Peri-Wound Care ?Topical ?Primary Dressing ?Secondary Dressing ?Secured With ?Compression Wrap ?Compression Stockings ?Add-Ons ?Wound #20 (Amputation Site - Transmetatarsal) Wound Laterality: Right ?Cleanser ?Peri-Wound Care ?Topical ?Primary Dressing ?KerraCel Ag Gelling Fiber Dressing, 2x2 in (silver alginate) ?Discharge Instruction: Apply silver alginate to wound bed as instructed ?Secondary Dressing ?ABD Pad, 8x10 ?Discharge Instruction: Apply over primary dressing as directed. ?Secured With ?Kerlix Roll Sterile, 4.5x3.1 (in/yd) ?Discharge Instruction: Secure with Kerlix as directed. ?50M Medipore H Soft Cloth Surgical T ape, 4 x 10 (in/yd) ?Discharge Instruction: Secure with tape as directed. ?Compression Wrap ?Compression Stockings ?Add-Ons ?Electronic Signature(s) ?Signed: 01/17/2022 4:02:34 PM By:  [name] MD FACS ?Entered By: [name] on 01/17/2022 16:02:34 ?-------------------------------------------------------------------------------- ?Multi-Disciplinary Care Plan Details ?Patient Name: ?Date

## 2022-01-17 NOTE — Progress Notes (Addendum)
[name], [name] ([id number]) ?Visit Report for 01/17/2022 ?Arrival Information Details ?Patient Name: Date of Service: ?[name], [name] 01/17/2022 12:30 PM ?Medical Record Number: [id number] ?Patient Account Number: [id number] ?Date of Birth/Sex: Treating RN: ?22-Mar-1941 (81 y.o. [name], [name] ?Primary Care Randie Tallarico: [name] ?Other Clinician: [name] ?Referring Dosia Yodice: ?Treating Kaid Seeberger/[name]: [name] ?[name] ?Weeks in Treatment: 30 ?Visit Information History Since Last Visit ?All ordered tests and consults were completed: Yes ?Patient Arrived: [name] ?Added or deleted any medications: No ?Arrival Time: 12:46 ?Any new allergies or adverse reactions: No ?Accompanied By: None ?Had a fall or experienced change in No ?Transfer Assistance: None ?activities of daily living that may affect ?Patient Identification Verified: Yes ?risk of falls: ?Secondary Verification Process Completed: Yes ?Signs or symptoms of abuse/neglect since last visito No ?Patient Requires Transmission-Based Precautions: No ?Hospitalized since last visit: No ?Patient Has Alerts: Yes ?Implantable device outside of the clinic excluding No ?Patient Alerts: Patient on Blood Thinner cellular tissue based products placed in the center ?since last visit: ?Pain Present Now: No ?Notes ?Paper version used prior to treatment. ?Electronic Signature(s) ?Signed: 01/17/2022 1:34:45 PM By: [name] EMT ?Entered By: [name] on 01/17/2022 13:34:44 ?-------------------------------------------------------------------------------- ?Encounter Discharge Information Details ?Patient Name: Date of Service: ?[name], [name] 01/17/2022 12:30 PM ?Medical Record Number: [id number] ?Patient Account Number: [id number] ?Date of Birth/Sex: Treating RN: ?November 08, 1940 (81 y.o. [name], [name] ?Primary Care Rondal Vandevelde: [name] ?Other Clinician: [name] ?Referring Roy Snuffer: ?Treating Samiksha Pellicano/[name]: [name] ?[name] ?Weeks in Treatment: 30 ?Encounter Discharge Information Items ?Discharge Condition: Stable ?Ambulatory Status: [name] ?Discharge Destination: Other (Note Required) ?Accompanied By: None ?Schedule Follow-up Appointment: No ?Clinical Summary of Care: ?Notes ?The patient had a wound clinic visit after her treatment today. ?Electronic Signature(s) ?Signed: 01/17/2022 3:16:22 PM By: [name] EMT ?Entered By: [name] on 01/17/2022 15:16:22 ?-------------------------------------------------------------------------------- ?Vitals Details ?Patient Name: ?Date of Service: ?[name], [name] 01/17/2022 12:30 PM ?Medical Record Number: [id number] ?Patient Account Number: [id number] ?Date of Birth/Sex: ?Treating RN: ?Jul 12, 1941 (81 y.o. [name], [name] ?Primary Care Jayr Lupercio: [name] ?Other Clinician: [name] ?Referring Latessa Tillis: ?Treating Crystin Lechtenberg/[name]: [name] ?[name] ?Weeks in Treatment: 30 ?Vital Signs ?Time Taken: 13:01 ?Temperature (??F): 97.7 ?Height (in): 64 ?Pulse (bpm): 79 ?Weight (lbs): 135 ?Respiratory Rate (breaths/min): 16 ?Body Mass Index (BMI): 23.2 ?Blood Pressure (mmHg): 146/51 ?Capillary Blood Glucose (mg/dl): 222 ?Reference Range: 80 - 120 mg / dl ?Notes ?Paper version used prior to treatment. ?Electronic Signature(s) ?Signed: 01/17/2022 1:35:25 PM By: [name] EMT ?Entered By: [name] on 01/17/2022 13:35:25 ?

## 2022-01-17 NOTE — Progress Notes (Signed)
SUEANNE, MANIACI ([id number]) ?Visit Report for 01/17/2022 ?Problem List Details ?Patient Name: Date of Service: ?[name], [name] 01/17/2022 12:30 PM ?Medical Record Number: [id number] ?Patient Account Number: [id number] ?Date of Birth/Sex: Treating RN: ?December 01, 1940 (81 y.o. [name], [name] ?Primary Care Provider: [name] ?Other Clinician: [name] ?Referring Provider: ?Treating Provider/[name]: [name] ?[name] ?Weeks in Treatment: 30 ?Active Problems ?ICD-10 ?Encounter ?Code Description Active Date MDM ?Diagnosis ?E11.621 Type 2 diabetes mellitus with foot ulcer 06/15/2021 No Yes ?T81.31XD Disruption of external operation (surgical) wound, not elsewhere classified, 06/15/2021 No Yes ?subsequent encounter ?L97.514 Non-pressure chronic ulcer of other part of right foot with necrosis of bone 09/20/2021 No Yes ?E11.51 Type 2 diabetes mellitus with diabetic peripheral angiopathy without gangrene 06/15/2021 No Yes ?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, right ankle and foot 10/25/2021 No Yes ?Inactive Problems ?ICD-10 ?Code Description Active Date Inactive Date ?L97.428 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 06/15/2021 06/15/2021 ?L97.528 Non-pressure chronic ulcer of other part of left foot with other specified severity 06/15/2021 06/15/2021 ?Resolved Problems ?Electronic Signature(s) ?Signed: 01/17/2022 3:14:48 PM By: [name] EMT ?Signed: 01/17/2022 5:05:56 PM By: [name] MD FACS ?Entered By: [name] on 01/17/2022 15:14:47 ?-------------------------------------------------------------------------------- ?SuperBill Details ?Patient Name: ?Date of Service: ?[name], [name] 01/17/2022 ?Medical Record Number: [id number] ?Patient Account Number: [id number] ?Date of Birth/Sex: ?Treating RN: ?09-24-1941 (81 y.o. [name], [name] ?Primary Care Provider: [name] ?Other Clinician: [name] ?Referring Provider: ?Treating  Provider/[name]: [name] ?[name] ?Weeks in Treatment: 30 ?Diagnosis Coding ?ICD-10 Codes ?Code Description ?E11.621 Type 2 diabetes mellitus with foot ulcer ?T81.31XD Disruption of external operation (surgical) wound, not elsewhere classified, subsequent encounter ?L97.514 Non-pressure chronic ulcer of other part of right foot with necrosis of bone ?E11.51 Type 2 diabetes mellitus with diabetic peripheral angiopathy without gangrene ?H85.277 Other chronic osteomyelitis, right ankle and foot ?Facility Procedures ?CPT4 Code: 82423536 ?Description: G0277-(Facility Use Only) HBOT full body chamber, 74mn , ICD-10 Diagnosis Description 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 M86.671 Other  ?chronic osteomyelitis, right ankle and foot T81.31XD Disruption of external operation (surgical) wound, not elsewhere classified, subse ?Modifier: quent encounter ?Quantity: 4 ?Physician Procedures ?: CPT4 Code Description Modifier [id number] PHYS HYPERBARIC OXYGEN THERAPY ICD-10 Diagnosis Description 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

## 2022-01-17 NOTE — Progress Notes (Addendum)
JOURDIN, GENS ([id number]) ?Visit Report for 01/17/2022 ?HBO Details ?Patient Name: Date of Service: ?[name], [name] 01/17/2022 12:30 PM ?Medical Record Number: [id number] ?Patient Account Number: [id number] ?Date of Birth/Sex: Treating RN: ?09/26/1940 (81 y.o. [name], [name] ?Primary Care Romulus Hanrahan: [name] ?Other Clinician: [name] ?Referring Fedor Kazmierski: ?Treating Galaxy Borden/[name]: [name] ?[name] ?Weeks in Treatment: 30 ?HBO Treatment Course Details ?Treatment Course Number: 2 ?Ordering Tasheika Kitzmiller: [name] ?T Treatments Ordered: ?otal 50 HBO Treatment 11/08/2021 ?Start Date: ?HBO Indication: ?Diabetic Ulcer(s) of the Lower Extremity HBO Treatment 01/17/2022 ?End Date: ?Notes: Extended 2/29/2023 ?HBO Discharge Treatment Series Complete; Wound Progress Plateaued / ?Standard/Conservative Wound Care tried and failed greater than or equal to ?Outcome: Significant Improvement ?30 days ?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 ?HBO Treatment Details ?Treatment Number: 48 ?Patient Type: Outpatient ?Chamber Type: Monoplace ?Chamber Serial #: [id number] ?Treatment Protocol: 2.0 ATA with 90 minutes oxygen, and no air breaks ?Treatment Details ?Compression Rate Down: 2.0 psi / minute ?De-Compression Rate Up: 2.0 psi / minute ?Air ?breaks ?and ?breathing Decompress Decompress ?Compress Tx Pressure ?Begins Reached periods Begins Ends ?(leave ?unused ?spaces ?blank) ?Chamber Pressure (ATA 1 2 ------2 1 ?) ?Clock Time (24 hr) 13:05 13:14 - - - - - - 14:44 14:52 ?Treatment Length: 107 (minutes) ?Treatment Segments: 4 ?Vital Signs ?Capillary Blood Glucose Reference Range: 80 - 120 mg / dl ?HBO Diabetic Blood Glucose Intervention Range: <131 mg/dl or >249 mg/dl ?Type: Time Vitals Blood Pulse: Respiratory Temperature: Capillary Blood Glucose Pulse Action ?Taken: Pressure: Rate: Glucose (mg/dl): Meter #: Oximetry (%) Taken: ?Pre 13:01 146/51 79 16 97.7 222 ?Post 14:54 146/53 67 16  97.5 113 Patient asked and given 1 Glucerna to drink. ?Treatment Response ?Treatment Toleration: Well ?Treatment Completion Status: Treatment Completed without Adverse Event ?Treatment Notes ?I asked the patient if she had any problems with her sinus or ears today. She stated no. ?She did well today with her treatment. ?I asked her after her treatment if she had any problems with her ears or sinus, She stated no. The patient cannot come in tomorrow 01/18/2022, and Per her ?request, today was the last treatment. ?Additional Procedure Documentation ?Tissue Sevierity: Fat layer exposed ?Physician HBO Attestation: ?I certify that I supervised this HBO treatment in accordance with Medicare ?guidelines. A trained emergency response team is readily available per Yes ?hospital policies and procedures. ?Continue HBOT as ordered. Yes ?Electronic Signature(s) ?Signed: 01/17/2022 5:03:47 PM By: [name] MD FACS ?Previous Signature: 01/17/2022 3:59:19 PM Version By: [name] EMT ?Previous Signature: 01/17/2022 3:14:03 PM Version By: [name] EMT ?Previous Signature: 01/17/2022 1:38:02 PM Version By: [name] EMT ?Entered By: [name] on 01/17/2022 17:03:46 ?-------------------------------------------------------------------------------- ?HBO Safety Checklist Details ?Patient Name: ?Date of Service: ?[name], [name] 01/17/2022 12:30 PM ?Medical Record Number: [id number] ?Patient Account Number: [id number] ?Date of Birth/Sex: ?Treating RN: ?July 23, 1941 (81 y.o. [name], [name] ?Primary Care Deadra Diggins: [name] ?Other Clinician: [name] ?Referring Jenilyn Magana: ?Treating Brionna Romanek/[name]: [name] ?[name] ?Weeks in Treatment: 30 ?HBO Safety Checklist Items ?Safety Checklist ?Consent Form Signed ?Patient voided / foley secured and emptied ?When did you last eato 1210 ?Last dose of injectable or oral agent 0830 ?Ostomy pouch emptied and vented if applicable ?NA ?All  implantable devices assessed, documented and approved ?NA ?Intravenous access site secured and place ?NA ?Valuables secured ?Linens and cotton and cotton/polyester blend (less than 51% polyester) ?Personal oil-based products / skin lotions / body lotions removed ?Wigs or hairpieces removed Patient removed ?Smoking  or tobacco materials removed ?Books / newspapers / magazines / loose paper removed ?Cologne, aftershave, perfume and deodorant removed ?Jewelry removed (may wrap wedding band) ?NA ?Make-up removed ?Hair care products removed ?Battery operated devices (external) removed ?Heating patches and chemical warmers removed ?Titanium eyewear removed ?NA ?Nail polish cured greater than 10 hours ?NA ?Casting material cured greater than 10 hours ?NA ?Hearing aids removed ?NA ?Loose dentures or partials removed ?NA ?Prosthetics have been removed ?NA ?Patient demonstrates correct use of air break device (if applicable) ?Patient concerns have been addressed ?Patient grounding bracelet on and cord attached to chamber ?Specifics for Inpatients (complete in addition to above) ?Medication sheet sent with patient ?NA ?Intravenous medications needed or due during therapy sent with patient ?NA ?Drainage tubes (e.g. nasogastric tube or chest tube secured and vented) ?NA ?Endotracheal or Tracheotomy tube secured ?NA ?Cuff deflated of air and inflated with saline ?NA ?Airway suctioned ?NA ?Notes ?Paper version used prior to treatment. Safety check was done before treatment started. ?Electronic Signature(s) ?Signed: 01/17/2022 1:37:04 PM By: [name] EMT ?Entered By: [name] on 01/17/2022 13:37:04 ?

## 2022-01-17 NOTE — Progress Notes (Signed)
RAECHEL, MARCOS ([id number]) ?Visit Report for 01/17/2022 ?Chief Complaint Document Details ?Patient Name: Date of Service: ?[name], [name] 01/17/2022 3:00 PM ?Medical Record Number: [id number] ?Patient Account Number: [id number] ?Date of Birth/Sex: Treating RN: ?[birth year] (81 y.o. F) ?Primary Care Provider: [name] Other Clinician: ?Referring Provider: ?Treating Provider/[name]: [name] ?[name] ?Weeks in Treatment: 30 ?Information Obtained from: Patient ?Chief Complaint ?07/13/2019; patient is here for review wounds on the toes of her right foot and the left fourth toe ?08/04/2020; patient is here for review of wounds at the anterior part of her right first met head/TMA site and another area on the right medial calf at a vein ?harvest site. ?Electronic Signature(s) ?Signed: 01/17/2022 4:02:58 PM By: [name] MD FACS ?Entered By: [name] on 01/17/2022 16:02:58 ?-------------------------------------------------------------------------------- ?Debridement Details ?Patient Name: Date of Service: ?[name], [name] 01/17/2022 3:00 PM ?Medical Record Number: [id number] ?Patient Account Number: [id number] ?Date of Birth/Sex: Treating RN: ?[birth year] (81 y.o. [name], [name] ?Primary Care Provider: [name] Other Clinician: ?Referring Provider: ?Treating Provider/[name]: [name] ?[name] ?Weeks in Treatment: 30 ?Debridement Performed for Assessment: Wound #20 Right Amputation Site - Transmetatarsal ?Performed By: Physician [name], MD ?Debridement Type: Debridement ?Severity of Tissue Pre Debridement: Fat layer exposed ?Level of Consciousness (Pre-procedure): Awake and Alert ?Pre-procedure Verification/Time Out Yes - 15:30 ?Taken: ?Start Time: 15:31 ?Pain Control: ?Other : benzocaine 20%spray ?T Area Debrided (L x W): ?otal 0.5 (cm) x 0.5 (cm) = 0.25 (cm?) ?Tissue and other material debrided: ?Non-Viable, Callus, Skin:  Epidermis ?Level: Skin/Epidermis ?Debridement Description: Selective/Open Wound ?Instrument: Curette ?Bleeding: None ?Procedural Pain: 0 ?Post Procedural Pain: 0 ?Response to Treatment: Procedure was tolerated well ?Level of Consciousness (Post- Awake and Alert ?procedure): ?Post Debridement Measurements of Total Wound ?Length: (cm) 0.1 ?Width: (cm) 0.1 ?Depth: (cm) 0.1 ?Volume: (cm?) 0.001 ?Character of Wound/Ulcer Post Debridement: Improved ?Severity of Tissue Post Debridement: Fat layer exposed ?Post Procedure Diagnosis ?Same as Pre-procedure ?Electronic Signature(s) ?Signed: 01/17/2022 5:05:56 PM By: [name] MD FACS ?Signed: 01/17/2022 5:38:47 PM By: [name] RN, BSN ?Entered By: [name] on 01/17/2022 15:43:36 ?-------------------------------------------------------------------------------- ?HPI Details ?Patient Name: Date of Service: ?[name], [name] 01/17/2022 3:00 PM ?Medical Record Number: [id number] ?Patient Account Number: [id number] ?Date of Birth/Sex: Treating RN: ?[birth year] (81 y.o. F) ?Primary Care Provider: [name] Other Clinician: ?Referring Provider: ?Treating Provider/[name]: [name] ?[name] ?Weeks in Treatment: 30 ?History of Present Illness ?HPI Description: ADMISSION ?07/13/2019 ?Patient is a 81 year old type II diabetic. She has known PAD. She has been followed by Dr. Jacqualyn Posey of podiatry for blistering areas on her toes dating back to ?04/30/2019 which at this was the left fourth toe. By 8/11 she had wounds on the right and left second toes. She underwent arterial studies by Dr. Alvester Chou on 7/31 ?that showed ABIs on the right of 0.60 TBI of 0.26 on the left ABI of 0.56 and a TBI of 0.25. By 9/10 she had ischemic-looking wounds per Dr. Earleen Newport on the ?right first, left first second and third. She has been using Medihoney and then mupirocin more recently simply Betadine. ?The patient underwent angiography by Dr. Gwenlyn Found on 9/21. On the right this  showed 90 to 95% calcified distal right common femoral artery stenosis and a 95% ?focal mid SFA stenosis followed by an 80% segmental stenosis. Noted that there was 1 vessel runoff in the foot via the peroneal. ?On the left there was an 80% left SFA, 70% mid left SFA. There was  a short segment calcified CTO distal less than SFA and above-knee popliteal artery ?reconstituting with two-vessel runoff. 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 It was felt that she had bilateral SFA disease as well as tibial vessel disease. An ?attempt was made to revascularize the left SFA but they were unable to cross because of the highly calcified nature of the lesion. 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 She has an area on the medial left fourth toe with raised horned callus on top of this. I am not certain what this represents. With regards to pain she has ?about a 2-1/2 I will claudication tolerance in the grocery store. She has some pain in night which is relieved by putting her feet down over the side of the bed. ?She is wearing Nitro-Dur patches on the top of her right foot. ?Past medical history includes type 2 diabetes with secondary PAD, neoplasm of the skin, diabetic retinopathy, carotid artery stenosis, hyperlipidemia ?hypertension. ?11/2; the last time the patient was here I spoke to Dr. Gwenlyn Found about revascularization options on the right. As I understand things currently Dr. Gwenlyn Found spoke with ?Dr. Trula Slade and ultimately the patient was taken to surgery on 10/27. She had a right iliofemoral endarterectomy with a bovine patch angioplasty. I think the ?plan now is for her to have a staged intervention on the right SFA by Dr. Alvester Chou. Per the patient's understanding Dr. Gwenlyn Found and Dr. Jacqualyn Posey are waiting to see ?when the gangrenous toes on the right foot can be amputated. ?The patient states her  pain is a lot better and she is grateful for that. 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 Small area ?on the left fourth toe. She is using Betadine to the mummified areas on the right and Medihoney on the left ?08/27/2019; the last time I saw this patient I discharged her from the clinic. She had been revascularized by Dr. Trula Slade and she had a right iliofemoral ?endarterectomy with a bovine angioplasty. 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 Jacqualyn Posey of podiatry on ?08/07/2019. I note that she also had a intervention by Dr. Gwenlyn Found and he performed a directional atherectomy and drug-coated balloon angioplasty of the SFA ?and popliteal artery on the right. I am not certain of the date of Dr. Kennon Holter procedure as of the time of this dictation. She was referred back to [place] by Dr. ?Earleen Newport predominantly for follow-up of the left fourth toe. She still has sutures and stitches in the right TMA site. She states her pain is a lot better. She ?expresses concern about the condition of the amputation site at the TMA. She is on doxycycline I think prescribed by Dr. Earleen Newport. She is complaining of some ?pain at night ?12/10; I spoke to Dr. Jacqualyn Posey last week. He removed the sutures on the right foot on Monday of this week. She has the area on the left fourth toe just ?proximal to the PIP and then the right TMA site. She is still on doxycycline and has enough through next week. ?Unfortunately the TMA site does not look good at all. Both on the lateral and medial part of the incisions are areas that probe to bone. There is purulence over ?the medial part which I have cultured. We will use silver alginate. Left fourth toe looks  somewhat better but there was still exposed bone ?12/17; patient has an MRI booked for 12/30. Culture I did last week showed Pseudomonas Serratia and Enterococcus. This was purulent drainage coming out of ?the medial part of  her amputation site. I use cefdinir 300 twice daily for 10 days that started on 12/15. Her x-ray on the right showed limited evaluation for ?osteomyelitis. The findings could have been postoperative. There was subtle ero

## 2022-01-17 NOTE — Progress Notes (Signed)
[name], [name] ([id number]) ?Visit Report for 01/16/2022 ?HBO Details ?Patient Name: Date of Service: ?[name], [name] 01/16/2022 12:30 PM ?Medical Record Number: [id number] ?Patient Account Number: [id number] ?Date of Birth/Sex: Treating RN: ?02-Jun-1941 (81 y.o. [name], [name] ?Primary Care Mubashir Mallek: [name] ?Other Clinician: [name] ?Referring Dylyn Mclaren: ?Treating Brennley Curtice/[name]: [name] ?[name] ?Weeks in Treatment: 30 ?HBO Treatment Course Details ?Treatment Course Number: 2 ?Ordering Eriona Kinchen: [name] ?T Treatments Ordered: ?otal 50 HBO Treatment Start Date: 11/08/2021 ?HBO Indication: ?Diabetic Ulcer(s) of the Lower Extremity ?Notes: Extended 2/29/2023 ?Standard/Conservative Wound Care tried and failed greater than or equal to ?30 days ?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 ?HBO Treatment Details ?Treatment Number: 39 ?Patient Type: Outpatient ?Chamber Type: Monoplace ?Chamber Serial #: [id number] ?Treatment Protocol: 2.0 ATA with 90 minutes oxygen, and no air breaks ?Treatment Details ?Compression Rate Down: 2.0 psi / minute ?De-Compression Rate Up: 3.5 psi / minute ?Air ?breaks ?and ?breathing Decompress Decompress ?Compress Tx Pressure ?Begins Reached periods Begins Ends ?(leave ?unused ?spaces ?blank) ?Chamber Pressure (ATA 1 2 ------2 1 ?) ?Clock Time (24 hr) 13:03 13:10 - - - - - - 13:44 13:49 ?Treatment Length: 46 (minutes) ?Treatment Segments: 2 ?Vital Signs ?Capillary Blood Glucose Reference Range: 80 - 120 mg / dl ?HBO Diabetic Blood Glucose Intervention Range: <131 mg/dl or >249 mg/dl ?Type: Time Vitals Blood Pulse: Respiratory Temperature: Capillary Blood Glucose Pulse Action ?Taken: Pressure: Rate: Glucose (mg/dl): Meter #: Oximetry (%) Taken: ?Pre 12:56 132/58 66 18 97.9 174 100 ?Post 14:22 119 > or = to 101 mg/dL, discharge per protocol ?Treatment Response ?Treatment Toleration: Well ?Treatment Completion Status: Treatment Aborted/Not  Restarted ?Reason: Ling Flesch Choice ?Treatment Notes ?Treatment aborted early due to fire alarm. Patient tolerated treatment well. Patient tolerated travel. No issues reported. ?Additional Procedure Documentation ?Tissue Sevierity: Fat layer exposed ?Physician HBO Attestation: ?I certify that I supervised this HBO treatment in accordance with Medicare ?guidelines. A trained emergency response team is readily available per Yes ?hospital policies and procedures. ?Continue HBOT as ordered. Yes ?Electronic Signature(s) ?Signed: 01/16/2022 4:33:51 PM By: [name] DO ?Previous Signature: 01/16/2022 3:27:46 PM Version By: [name] CHT EMT BS ?, , ?Previous Signature: 01/16/2022 3:27:09 PM Version By: [name] CHT EMT BS ?, , ?Previous Signature: 01/16/2022 3:26:27 PM Version By: [name] CHT EMT BS ?, , ?Entered By: [name] on 01/16/2022 16:32:06 ?-------------------------------------------------------------------------------- ?HBO Safety Checklist Details ?Patient Name: ?Date of Service: ?[name], [name] 01/16/2022 12:30 PM ?Medical Record Number: [id number] ?Patient Account Number: [id number] ?Date of Birth/Sex: ?Treating RN: ?1941-07-13 (81 y.o. [name], [name] ?Primary Care [name]: [name] ?Other Clinician: [name] ?Referring [name]: ?Treating [name]/[name]: [name] ?[name] ?Weeks in Treatment: 30 ?HBO Safety Checklist Items ?Safety Checklist ?Consent Form Signed ?Patient voided / foley secured and emptied ?When did you last eato 12 PM snack 0830 Breakfast ?Last dose of injectable or oral agent 0830 ?Ostomy pouch emptied and vented if applicable ?NA ?All implantable devices assessed, documented and approved ?NA ?Intravenous access site secured and place ?NA ?Valuables secured ?Linens and cotton and cotton/polyester blend (less than 51% polyester) ?Personal oil-based products / skin lotions / body lotions removed ?Wigs or  hairpieces removed Patient removed item. ?Smoking or tobacco materials removed ?NA ?Books / newspapers / magazines / loose paper removed ?Cologne, aftershave, perfume and deodorant removed ?Jewelry removed (may wrap wedding band) ?Make-up removed ?Hair care products removed ?Battery operated devices (external) removed ?Heating patches and chemical warmers removed ?Titanium eyewear  removed ?NA ?Nail polish cured greater than 10 hours ?Casting material cured greater than 10 hours ?NA ?Hearing aids removed ?NA ?Loose dentures or partials removed ?NA ?Prosthetics have been removed ?NA ?Patient demonstrates correct use of air break device (if applicable) ?Patient concerns have been addressed ?Patient grounding bracelet on and cord attached to chamber ?Specifics for Inpatients (complete in addition to above) ?Medication sheet sent with patient ?NA ?Intravenous medications needed or due during therapy sent with patient ?NA ?Drainage tubes (e.g. nasogastric tube or chest tube secured and vented) ?NA ?Endotracheal or Tracheotomy tube secured ?NA ?Cuff deflated of air and inflated with saline ?NA ?Airway suctioned ?NA ?Notes ?Paper version used prior to treatment. ?Electronic Signature(s) ?Signed: 01/17/2022 11:39:41 AM By: [name] CHT EMT BS ?, , ?Entered By: [name] on 01/16/2022 13:17:21 ?

## 2022-01-19 DIAGNOSIS — Z89412 Acquired absence of left great toe: Secondary | ICD-10-CM | POA: Diagnosis not present

## 2022-01-19 DIAGNOSIS — I10 Essential (primary) hypertension: Secondary | ICD-10-CM | POA: Diagnosis not present

## 2022-01-19 DIAGNOSIS — E11628 Type 2 diabetes mellitus with other skin complications: Secondary | ICD-10-CM | POA: Diagnosis not present

## 2022-01-19 DIAGNOSIS — I70202 Unspecified atherosclerosis of native arteries of extremities, left leg: Secondary | ICD-10-CM | POA: Diagnosis not present

## 2022-01-19 DIAGNOSIS — D638 Anemia in other chronic diseases classified elsewhere: Secondary | ICD-10-CM | POA: Diagnosis not present

## 2022-01-19 DIAGNOSIS — Z4781 Encounter for orthopedic aftercare following surgical amputation: Secondary | ICD-10-CM | POA: Diagnosis not present

## 2022-01-22 DIAGNOSIS — Z89412 Acquired absence of left great toe: Secondary | ICD-10-CM | POA: Diagnosis not present

## 2022-01-22 DIAGNOSIS — E11628 Type 2 diabetes mellitus with other skin complications: Secondary | ICD-10-CM | POA: Diagnosis not present

## 2022-01-22 DIAGNOSIS — I10 Essential (primary) hypertension: Secondary | ICD-10-CM | POA: Diagnosis not present

## 2022-01-22 DIAGNOSIS — D638 Anemia in other chronic diseases classified elsewhere: Secondary | ICD-10-CM | POA: Diagnosis not present

## 2022-01-22 DIAGNOSIS — I70202 Unspecified atherosclerosis of native arteries of extremities, left leg: Secondary | ICD-10-CM | POA: Diagnosis not present

## 2022-01-22 DIAGNOSIS — Z4781 Encounter for orthopedic aftercare following surgical amputation: Secondary | ICD-10-CM | POA: Diagnosis not present

## 2022-01-24 DIAGNOSIS — D638 Anemia in other chronic diseases classified elsewhere: Secondary | ICD-10-CM | POA: Diagnosis not present

## 2022-01-24 DIAGNOSIS — I70202 Unspecified atherosclerosis of native arteries of extremities, left leg: Secondary | ICD-10-CM | POA: Diagnosis not present

## 2022-01-24 DIAGNOSIS — I10 Essential (primary) hypertension: Secondary | ICD-10-CM | POA: Diagnosis not present

## 2022-01-24 DIAGNOSIS — Z89412 Acquired absence of left great toe: Secondary | ICD-10-CM | POA: Diagnosis not present

## 2022-01-24 DIAGNOSIS — E11628 Type 2 diabetes mellitus with other skin complications: Secondary | ICD-10-CM | POA: Diagnosis not present

## 2022-01-24 DIAGNOSIS — Z4781 Encounter for orthopedic aftercare following surgical amputation: Secondary | ICD-10-CM | POA: Diagnosis not present

## 2022-01-26 DIAGNOSIS — I70202 Unspecified atherosclerosis of native arteries of extremities, left leg: Secondary | ICD-10-CM | POA: Diagnosis not present

## 2022-01-26 DIAGNOSIS — Z89412 Acquired absence of left great toe: Secondary | ICD-10-CM | POA: Diagnosis not present

## 2022-01-26 DIAGNOSIS — D638 Anemia in other chronic diseases classified elsewhere: Secondary | ICD-10-CM | POA: Diagnosis not present

## 2022-01-26 DIAGNOSIS — E11628 Type 2 diabetes mellitus with other skin complications: Secondary | ICD-10-CM | POA: Diagnosis not present

## 2022-01-26 DIAGNOSIS — I10 Essential (primary) hypertension: Secondary | ICD-10-CM | POA: Diagnosis not present

## 2022-01-26 DIAGNOSIS — Z4781 Encounter for orthopedic aftercare following surgical amputation: Secondary | ICD-10-CM | POA: Diagnosis not present

## 2022-01-27 DIAGNOSIS — Z20822 Contact with and (suspected) exposure to covid-19: Secondary | ICD-10-CM | POA: Diagnosis not present

## 2022-01-29 DIAGNOSIS — D509 Iron deficiency anemia, unspecified: Secondary | ICD-10-CM | POA: Diagnosis not present

## 2022-01-29 DIAGNOSIS — E1151 Type 2 diabetes mellitus with diabetic peripheral angiopathy without gangrene: Secondary | ICD-10-CM | POA: Diagnosis not present

## 2022-01-29 DIAGNOSIS — M86172 Other acute osteomyelitis, left ankle and foot: Secondary | ICD-10-CM | POA: Diagnosis not present

## 2022-01-29 DIAGNOSIS — Z89431 Acquired absence of right foot: Secondary | ICD-10-CM | POA: Diagnosis not present

## 2022-01-29 DIAGNOSIS — R1013 Epigastric pain: Secondary | ICD-10-CM | POA: Diagnosis not present

## 2022-01-29 DIAGNOSIS — Z20822 Contact with and (suspected) exposure to covid-19: Secondary | ICD-10-CM | POA: Diagnosis not present

## 2022-01-29 DIAGNOSIS — I10 Essential (primary) hypertension: Secondary | ICD-10-CM | POA: Diagnosis not present

## 2022-01-29 DIAGNOSIS — E113393 Type 2 diabetes mellitus with moderate nonproliferative diabetic retinopathy without macular edema, bilateral: Secondary | ICD-10-CM | POA: Diagnosis not present

## 2022-01-31 ENCOUNTER — Encounter (HOSPITAL_BASED_OUTPATIENT_CLINIC_OR_DEPARTMENT_OTHER): Payer: Medicare Other | Attending: General Surgery | Admitting: General Surgery

## 2022-01-31 ENCOUNTER — Other Ambulatory Visit: Payer: Medicare Other

## 2022-01-31 DIAGNOSIS — E11319 Type 2 diabetes mellitus with unspecified diabetic retinopathy without macular edema: Secondary | ICD-10-CM | POA: Insufficient documentation

## 2022-01-31 DIAGNOSIS — E1169 Type 2 diabetes mellitus with other specified complication: Secondary | ICD-10-CM | POA: Diagnosis not present

## 2022-01-31 DIAGNOSIS — M86671 Other chronic osteomyelitis, right ankle and foot: Secondary | ICD-10-CM | POA: Diagnosis not present

## 2022-01-31 DIAGNOSIS — E11621 Type 2 diabetes mellitus with foot ulcer: Secondary | ICD-10-CM | POA: Insufficient documentation

## 2022-01-31 DIAGNOSIS — E785 Hyperlipidemia, unspecified: Secondary | ICD-10-CM | POA: Diagnosis not present

## 2022-01-31 DIAGNOSIS — X58XXXD Exposure to other specified factors, subsequent encounter: Secondary | ICD-10-CM | POA: Diagnosis not present

## 2022-01-31 DIAGNOSIS — L97514 Non-pressure chronic ulcer of other part of right foot with necrosis of bone: Secondary | ICD-10-CM | POA: Insufficient documentation

## 2022-01-31 DIAGNOSIS — L97528 Non-pressure chronic ulcer of other part of left foot with other specified severity: Secondary | ICD-10-CM | POA: Diagnosis not present

## 2022-01-31 DIAGNOSIS — E1152 Type 2 diabetes mellitus with diabetic peripheral angiopathy with gangrene: Secondary | ICD-10-CM | POA: Diagnosis not present

## 2022-01-31 DIAGNOSIS — T8131XD Disruption of external operation (surgical) wound, not elsewhere classified, subsequent encounter: Secondary | ICD-10-CM | POA: Insufficient documentation

## 2022-01-31 DIAGNOSIS — Z87891 Personal history of nicotine dependence: Secondary | ICD-10-CM | POA: Insufficient documentation

## 2022-02-01 ENCOUNTER — Other Ambulatory Visit (INDEPENDENT_AMBULATORY_CARE_PROVIDER_SITE_OTHER): Payer: Medicare Other

## 2022-02-01 DIAGNOSIS — E1165 Type 2 diabetes mellitus with hyperglycemia: Secondary | ICD-10-CM | POA: Diagnosis not present

## 2022-02-01 LAB — BASIC METABOLIC PANEL
BUN: 24 mg/dL — ABNORMAL HIGH (ref 6–23)
CO2: 28 mEq/L (ref 19–32)
Calcium: 10 mg/dL (ref 8.4–10.5)
Chloride: 104 mEq/L (ref 96–112)
Creatinine, Ser: 1 mg/dL (ref 0.40–1.20)
GFR: 53.44 mL/min — ABNORMAL LOW (ref >60.00)
Glucose, Bld: 131 mg/dL — ABNORMAL HIGH (ref 70–99)
Potassium: 4.3 mEq/L (ref 3.5–5.1)
Sodium: 140 mEq/L (ref 135–145)

## 2022-02-01 LAB — HEMOGLOBIN A1C: Hgb A1c MFr Bld: 6.8 % — ABNORMAL HIGH (ref 4.6–6.5)

## 2022-02-01 NOTE — Progress Notes (Signed)
ELAYSIA, DEVARGAS ([id number]) ?Visit Report for 01/31/2022 ?Chief Complaint Document Details ?Patient Name: Date of Service: ?[name], [name] 01/31/2022 3:00 PM ?Medical Record Number: [id number] ?Patient Account Number: [id number] ?Date of Birth/Sex: Treating RN: ?[birth year] (81 y.o. [name], [name] ?Primary Care Provider: [name] Other Clinician: ?Referring Provider: ?Treating Provider/[name]: [name] ?[name] ?Weeks in Treatment: 32 ?Information Obtained from: Patient ?Chief Complaint ?07/13/2019; patient is here for review wounds on the toes of her right foot and the left fourth toe ?08/04/2020; patient is here for review of wounds at the anterior part of her right first met head/TMA site and another area on the right medial calf at a vein ?harvest site. ?Electronic Signature(s) ?Signed: 01/31/2022 4:43:00 PM By: [name] MD FACS ?Entered By: [name] on 01/31/2022 16:43:00 ?-------------------------------------------------------------------------------- ?HPI Details ?Patient Name: Date of Service: ?[name], [name] 01/31/2022 3:00 PM ?Medical Record Number: [id number] ?Patient Account Number: [id number] ?Date of Birth/Sex: Treating RN: ?1941-01-18 (81 y.o. [name], [name] ?Primary Care Provider: [name] Other Clinician: ?Referring Provider: ?Treating Provider/[name]: [name] ?[name] ?Weeks in Treatment: 32 ?History of Present Illness ?HPI Description: ADMISSION ?07/13/2019 ?Patient is a 81 year old type II diabetic. She has known PAD. She has been followed by Dr. Jacqualyn Posey of podiatry for blistering areas on her toes dating back to ?04/30/2019 which at this was the left fourth toe. By 8/11 she had wounds on the right and left second toes. She underwent arterial studies by Dr. Alvester Chou on 7/31 ?that showed ABIs on the right of 0.60 TBI of 0.26 on the left ABI of 0.56 and a TBI of 0.25. By 9/10 she had ischemic-looking wounds per Dr.  Earleen Newport on the ?right first, left first second and third. She has been using Medihoney and then mupirocin more recently simply Betadine. ?The patient underwent angiography by Dr. Gwenlyn Found on 9/21. On the right this showed 90 to 95% calcified distal right common femoral artery stenosis and a 95% ?focal mid SFA stenosis followed by an 80% segmental stenosis. Noted that there was 1 vessel runoff in the foot via the peroneal. ?On the left there was an 80% left SFA, 70% mid left SFA. There was a short segment calcified CTO distal less than SFA and above-knee popliteal artery ?reconstituting with two-vessel runoff. 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 It was felt that she had bilateral SFA disease as well as tibial vessel disease. An ?attempt was made to revascularize the left SFA but they were unable to cross because of the highly calcified nature of the lesion. 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 She has an area on the medial left fourth toe with raised horned callus on top of this. I am not certain what this represents. With regards to pain she has ?about a 2-1/2 I will claudication tolerance in the grocery store. She has some pain in night which is relieved by putting her feet down over the side of the bed. ?She is wearing Nitro-Dur patches on the top of her right foot. ?Past medical history includes type 2 diabetes with secondary PAD, neoplasm of the skin, diabetic retinopathy, carotid artery stenosis, hyperlipidemia ?hypertension. ?11/2; the last time the patient was here I spoke to Dr. Gwenlyn Found about revascularization options on the right. As I understand things currently Dr. Gwenlyn Found spoke with ?Dr. Trula Slade and ultimately the patient was taken  to surgery on 10/27. She had a right iliofemoral endarterectomy with a bovine patch angioplasty. I think the ?plan now is for her to have a  staged intervention on the right SFA by Dr. Alvester Chou. Per the patient's understanding Dr. Gwenlyn Found and Dr. Jacqualyn Posey are waiting to see ?when the gangrenous toes on the right foot can be amputated. ?The patient states her pain is a lot better and she is grateful for that. 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 Small area ?on the left fourth toe. She is using Betadine to the mummified areas on the right and Medihoney on the left ?08/27/2019; the last time I saw this patient I discharged her from the clinic. She had been revascularized by Dr. Trula Slade and she had a right iliofemoral ?endarterectomy with a bovine angioplasty. 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 Jacqualyn Posey of podiatry on ?08/07/2019. I note that she also had a intervention by Dr. Gwenlyn Found and he performed a directional atherectomy and drug-coated balloon angioplasty of the SFA ?and popliteal artery on the right. I am not certain of the date of Dr. Kennon Holter procedure as of the time of this dictation. She was referred back to [place] by Dr. ?Earleen Newport predominantly for follow-up of the left fourth toe. She still has sutures and stitches in the right TMA site. She states her pain is a lot better. She ?expresses concern about the condition of the amputation site at the TMA. She is on doxycycline I think prescribed by Dr. Earleen Newport. She is complaining of some ?pain at night ?12/10; I spoke to Dr. Jacqualyn Posey last week. He removed the sutures on the right foot on Monday of this week. She has the area on the left fourth toe just ?proximal to the PIP and then the right TMA site. She is still on doxycycline and has enough through next week. ?Unfortunately the TMA site does not look good at all. Both on the lateral and medial part of the incisions are areas that probe to bone. There is purulence over ?the medial part which I have cultured. We will use silver alginate. Left fourth toe looks somewhat  better but there was still exposed bone ?12/17; patient has an MRI booked for 12/30. Culture I did last week showed Pseudomonas Serratia and Enterococcus. This was purulent drainage coming out of ?the medial part of her amputation site. I use cefdinir 300 twice daily for 10 days that started on 12/15. Her x-ray on the right showed limited evaluation for ?osteomyelitis. The findings could have been postoperative. There was subtle erosion in the distal first and distal fifth metatarsal. An MRI was suggested. On ?the left she had irregularity of the fourth middle and proximal phalanx consistent with a history of osteomyelitis. I do not know that she has a history of ?osteomyelitis in this area. She had a newly defined area on the plantar third toe ?12/31; patient's MRI is listed below: ?MRI OF THE RIGHT FOREFOOT WITHOUT AND WITH CONTRAST ?TECHNIQUE: ?Multiplanar, multisequence MR imaging of the right forefoot was ?performed before and after the administration of intravenous ?contrast. ?CONTRAST 6 mL GADAVIST IV SOLN ?: ?COMPARISON: Plain films right foot 09/04/2019. ?FINDINGS: ?Bones/Joint/Cartilage ?The patient is status post transmetatarsal amputation as seen on the ?prior exam. Marrow edema and enhancement are seen in all of the ?distal metatarsals. In the first metatarsal, signal change extends ?1.5 cm proximal to the stump and in the second metatarsal extends ?  approximately 2 cm proximal to the stump. Edema and enhancement are ?seen in only the distal 0.7 cm of the third metatarsal stump and ?tips of the fourth and fifth metatarsals. Bone marrow signal is ?otherwise unremarkable. A small focus of subchondral edema is seen ?in the lateral talus, likely degenerative. ?Ligaments ?Intact. ?Muscles and Tendons ?No intramuscular fluid collection. ?Soft tissues ?Skin ulceration is seen off the stump of the first metatarsal. A ?thin fluid collection tracks deep to the wound and over the anterior ?metatarsals worrisome for  small abscess. Intense subcutaneous edema ?and enhancement are seen diffusely. 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

## 2022-02-02 ENCOUNTER — Ambulatory Visit: Payer: Medicare Other | Admitting: Podiatry

## 2022-02-07 ENCOUNTER — Encounter: Payer: Self-pay | Admitting: Endocrinology

## 2022-02-07 ENCOUNTER — Ambulatory Visit (INDEPENDENT_AMBULATORY_CARE_PROVIDER_SITE_OTHER): Payer: Medicare Other | Admitting: Endocrinology

## 2022-02-07 VITALS — BP 152/80 | HR 69 | Ht 65.0 in | Wt 146.4 lb

## 2022-02-07 DIAGNOSIS — E1165 Type 2 diabetes mellitus with hyperglycemia: Secondary | ICD-10-CM

## 2022-02-07 DIAGNOSIS — I6523 Occlusion and stenosis of bilateral carotid arteries: Secondary | ICD-10-CM

## 2022-02-07 MED ORDER — GLIMEPIRIDE 1 MG PO TABS: 2.0000 mg | ORAL_TABLET | Freq: Every day | ORAL | 2 refills | Status: DC

## 2022-02-07 MED ORDER — REPAGLINIDE 2 MG PO TABS: 2.0000 mg | ORAL_TABLET | Freq: Every day | ORAL | 1 refills | Status: DC

## 2022-02-07 NOTE — Progress Notes (Signed)
Patient ID: [name], female   DOB: 1940/11/06, 81 y.o.   MRN: [id number] ?       ? ? ?Reason for Appointment: Follow-up for endocrinology problems ? ? ?History of Present Illness:  ?        ?Date of diagnosis of type 2 diabetes mellitus: 1984      ? ?Background history:  ? ?She has been on metformin since about the time of her diagnosis and has been on other drugs also but has not had many changes in her regimen in the last several years ?She thinks her blood sugar used to be fairly well controlled ?At some point she was given Byetta for about a year and a half which improved her control but this was subsequently start because of it being too expensive for her.  She had no side effects with this ? ?Recent history:  ? ?Her A1c is 6.8, last 7.6 ? ?Non-insulin hypoglycemic regimen: Amaryl 1 mg in a.m., Jardiance 25 mg daily,   metformin 1 g daily, Prandin 1 mg before breakfast ? ?Current management, level of control, blood sugar patterns and problems identified: ? ?Fructosamine previously 237, now 275 ? ?She is mostly checking her blood sugars before breakfast recently and only sporadically after meals  ?When she was going for hyperbaric treatment she would sometimes eat a peanut butter toast in addition to her breakfast late morning but not clear if her blood sugar monitoring was before or after eating the snack ?Most of her hyperbaric treatments were at about 1-2 PM ?She continues to reduce her Amaryl to half tablet of the 2 mg in the morning and with this her blood sugars are not tending to be low in the afternoon ?Fasting blood sugars are generally fairly stable in the low 100 range ?Only now she is able to start doing a lot of walking ?Weight is about the same ?She has done only some protein like walnuts or eggs with her breakfast but not when she is eating cereal ?No hypoglycemia ?Renal function stable with ? ?      ?Side effects from medications have been: None ? ?Compliance with the medical regimen:  Good ?Hypoglycemia:   None ? ?Glucose monitoring:  About 1 times a day         Glucometer: One Touch. ?      ?Blood Glucose data: ?  ? ?PRE-MEAL Fasting Lunch Dinner Bedtime Overall  ?Glucose range: 90-139 154-261   90-261  ?Mean/median: 119 195   145  ? ?POST-MEAL PC Breakfast PC Lunch PC Dinner  ?Glucose range:   145  ?Mean/median:  140   ? ?Previously: ? ?PRE-MEAL Fasting Lunch Dinner Bedtime Overall  ?Glucose range: 95-174    84-208  ?Mean/median: 130    137  ? ?POST-MEAL 11 AM-2 PM PC Lunch PC Dinner  ?Glucose range: 95-173 101-175 181-208  ?Mean/median:  136 190  ? ? ? ?  ?Self-care: The diet that the patient has been following is: tries to limit sweets and fried food.     ?Meal times are:  Breakfast is at 8:30 AM lunch: 1 PM, dinner: 6:30 PM ? ?Typical meal intake: Breakfast is yogurt/cereal with fruit or egg/toast or oatmeal ?Sometimes drinking boost ? ?Lunch is usually soup with crackers, dinner is usually a low-fat meat with vegetables ?Snacks will be celery and ranch dressing ?              ?Dietician visit, most recent: 10/18 ?              ? ?  Weight history: ? ?Wt Readings from Last 3 Encounters:  ?02/07/22 146 lb 6.4 oz (66.4 kg)  ?01/05/22 148 lb (67.1 kg)  ?12/19/21 148 lb (67.1 kg)  ? ? ?Glycemic control: ?  ?Lab Results  ?Component Value Date  ? HGBA1C 6.8 (H) 02/01/2022  ? HGBA1C 7.6 (H) 09/29/2021  ? HGBA1C 5.8 06/28/2021  ? ?Lab Results  ?Component Value Date  ? MICROALBUR 3.8 (H) 09/29/2021  ? [place] 50 10/14/2021  ? CREATININE 1.00 02/01/2022  ? ?Lab Results  ?Component Value Date  ? MICRALBCREAT 16.3 09/29/2021  ? ? ?Lab Results  ?Component Value Date  ? FRUCTOSAMINE 261 11/28/2021  ? FRUCTOSAMINE 275 09/29/2021  ? FRUCTOSAMINE 237 03/29/2021  ? ?Lab Results  ?Component Value Date  ? HGB 10.4 (L) 10/27/2021  ? ? ? ? ?Allergies as of 02/07/2022   ? ?   Reactions  ? Augmentin [amoxicillin-pot Clavulanate] Other (See Comments)  ? Elevates potassium level  ? Bactrim  [sulfamethoxazole-trimethoprim] Other (See Comments)  ? ^ K+( elevated)   ? Demerol [meperidine]   ? Delusional   ? Scopolamine   ? Delusional   ? Tramadol Other (See Comments)  ? Keeps awake  ? Versed [midazolam] Anxiety  ? Frantic, out of my mind, agitated   ? ?  ? ?  ?Medication List  ?  ? ?  ? Accurate as of Feb 07, 2022  8:59 AM. If you have any questions, ask your nurse or doctor.  ?  ?  ? ?  ? ?acetaminophen 500 MG tablet ?Commonly known as: TYLENOL ?Take 1,000 mg by mouth every 6 (six) hours as needed for moderate pain or headache. ?  ?aspirin EC 81 MG tablet ?Take 81 mg by mouth at bedtime. ?  ?atorvastatin 80 MG tablet ?Commonly known as: LIPITOR ?TAKE 1 TABLET BY MOUTH ONCE DAILY. PATIENT  MUST  KEEP  UPCOMING  APPOINTMENT  FOR  FUTURE  REFILLS ?  ?brimonidine 0.2 % ophthalmic solution ?Commonly known as: ALPHAGAN ?Place 1 drop into the right eye 2 (two) times daily. ?  ?CINNAMON PO ?Take 2 tablets by mouth in the morning, at noon, and at bedtime. ?  ?[facility] SENIOR PROBIOTIC PO ?Take 1 capsule by mouth every evening. ?  ?Ferocon capsule ?Generic drug: ferrous [id number] C-folic acid ?Take 1 capsule by mouth 2 (two) times daily. ?  ?fluconazole 150 MG tablet ?Commonly known as: DIFLUCAN ?Take 1 tablet (150 mg total) by mouth daily. Can take 1 tab every 3 days if still having symptoms ?  ?glimepiride 2 MG tablet ?Commonly known as: AMARYL ?TAKE 1 TABLET BY MOUTH ONCE DAILY BEFORE BREAKFAST ?  ?hydrochlorothiazide 12.5 MG tablet ?Commonly known as: HYDRODIURIL ?Take 1 tablet by mouth once daily ?  ?Jardiance 25 MG Tabs tablet ?Generic drug: empagliflozin ?TAKE 1 TABLET BY MOUTH ONCE DAILY BEFORE BREAKFAST ?  ?metFORMIN 1000 MG tablet ?Commonly known as: GLUCOPHAGE ?Take 1 tablet by mouth twice daily ?What changed: when to take this ?  ?metoprolol tartrate 25 MG tablet ?Commonly known as: LOPRESSOR ?TAKE 1 & 1/2 (ONE & ONE-HALF) TABLETS BY MOUTH TWICE DAILY ?  ?NON FORMULARY ?Prisma topical ointment  for right foot ?  ?OneTouch Delica Plus [id number] Misc ?Use to check blood sugars twice daily. ?  ?OneTouch Verio test strip ?Generic drug: glucose blood ?Use to check blood sugars once daily ?  ?pantoprazole 40 MG tablet ?Commonly known as: PROTONIX ?Take 1 tablet by mouth once daily ?  ?pyridoxine 100 MG tablet ?Commonly  known as: B-6 ?Take 100 mg by mouth every evening. ?  ?repaglinide 1 MG tablet ?Commonly known as: Prandin ?Take 1 tablet (1 mg total) by mouth daily before breakfast. ?  ?Systane Balance 0.6 % Soln ?Generic drug: Propylene Glycol ?Place 1 drop into the left eye 3 (three) times daily as needed (dry/irritated eyes). ?  ?telmisartan 80 MG tablet ?Commonly known as: MICARDIS ?Take 1 tablet by mouth once daily ?  ?Xarelto 2.5 MG Tabs tablet ?Generic drug: rivaroxaban ?Take 1 tablet by mouth twice daily ?  ? ?  ? ? ?Allergies:  ?Allergies  ?Allergen Reactions  ? Augmentin [Amoxicillin-Pot Clavulanate] Other (See Comments)  ?  Elevates potassium level  ? Bactrim [Sulfamethoxazole-Trimethoprim] Other (See Comments)  ?  ^ K+( elevated)   ? Demerol [Meperidine]   ?  Delusional   ? Scopolamine   ?  Delusional   ? Tramadol Other (See Comments)  ?  Keeps awake  ? Versed [Midazolam] Anxiety  ?  Frantic, out of my mind, agitated   ? ? ?Past Medical History:  ?Diagnosis Date  ? Anemia   ? 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 Arthritis   ? Cancer [facility])   ? 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 Complication of anesthesia   ? VERSED- agitated, muscle spasms, "jerking" , frantic , (never had this occurence in the pas)   ? Coronary artery disease   ? Diabetes mellitus without complication ([place])   ? Dysrhythmia   ? PVC's  ? GERD (gastroesophageal reflux disease)   ? Heart murmur   ? History of hiatal hernia   ? Hyperlipidemia   ? Hypertension 11/20/11  ? ECHO- EF>55% Borderline concentric left ventricular hypertrophy. There is a small calcified mass in the L:A near the LA appendage. No valvular masses seen with associated  mitral annular calcification. LA Volume/ BSA27.4 ml/m2 No AS. Right ventricular systolic pressure is elevated at 31mHg.  ? Myocardial infarction ([facility]   ? June 2021  ? Neuromuscular disorder ([facility]   ? neuropathy in bilateral feet  ? Periphera

## 2022-02-07 NOTE — Patient Instructions (Addendum)
Check blood sugars on waking up 2 days a week ? ?Also check blood sugars about 2 hours after meals and do this after different meals by rotation ? ?Recommended blood sugar levels on waking up are 90-130 and about 2 hours after meal is 130-180 ? ?Please bring your blood sugar monitor to each visit, thank you ? ?Increase Prandin in am to '2mg'$  ?

## 2022-02-12 ENCOUNTER — Ambulatory Visit (INDEPENDENT_AMBULATORY_CARE_PROVIDER_SITE_OTHER): Payer: Medicare Other | Admitting: Podiatry

## 2022-02-12 DIAGNOSIS — E1142 Type 2 diabetes mellitus with diabetic polyneuropathy: Secondary | ICD-10-CM | POA: Diagnosis not present

## 2022-02-12 DIAGNOSIS — Z89431 Acquired absence of right foot: Secondary | ICD-10-CM | POA: Diagnosis not present

## 2022-02-12 DIAGNOSIS — I739 Peripheral vascular disease, unspecified: Secondary | ICD-10-CM | POA: Diagnosis not present

## 2022-02-12 DIAGNOSIS — Z89432 Acquired absence of left foot: Secondary | ICD-10-CM

## 2022-02-12 NOTE — Progress Notes (Unsigned)
[facility] OF [place]  ASSESSMENT / PLAN: 81 y.o. female with resolving right foot ulceration.  Good result from recent endovascular intervention.  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  She is getting IV antibiotics for this.  She is getting hyperbaric oxygen therapy.  I am hopeful that she will improve without further intervention.  I will see her again in several months with a repeat ABI to evaluate the foot.  If she has persistent ulceration, she may need further foot debridement.  CHIEF COMPLAINT: Status post endovascular intervention in the right lower extremity  HISTORY OF PRESENT ILLNESS: [name] is a 81 y.o. female very well-known to me.  She returns to clinic after endovascular intervention in her right lower extremity to salvage her right femoral-popliteal bypass graft.  She tolerated this very well.  She did receive Versed, which he has a poor reaction to in the past.  She required observation overnight.  She was discharged next day without difficulty.  She is getting IV antibiotics.  She is getting hyperbaric oxygen therapy.  These both seem to be helping her foot.  She feels well overall.  VASCULAR SURGICAL HISTORY:  07/21/19: Right external iliac, common femoral, profundofemoral, and superficial femoral endarterectomy with bovine pericardial patch angioplasty 08/16/20: Right common femoral endarterectomy with bovine pericardial patch angioplasty; Right common femoral to above-knee popliteal artery bypass with 6 mm ringed PTFE 04/28/21: LLE angiogram 05/03/21: left common femoral - tibioperoneal trunk bypass with 17m ringed ePTFE in anatomic tunnel; left third toe open amputation 05/11/21: Open left transmetatarsal amputation 09/07/21: Angioplasty of left lower extremity distal bypass anastomosis and anterior tibial artery (3 mm [place]) 10/13/21: Right iliofemoral drug-coated angioplasty (6x649mRanger); Right popliteal drug-coated angioplasty ([id number]); Right tibioperoneal trunk and peroneal angioplasty (3x60m11merling)  Past Medical History:  Diagnosis Date   Anemia    after CABG in june 2021   Arthritis    Cancer (HCC)[place] removal from nose - MOSE procedure    Complication of anesthesia    VERSED- agitated, muscle spasms, "jerking" , frantic , (never had this occurence in the pas)    Coronary artery disease    Diabetes mellitus without complication (HCC)[place] Dysrhythmia    PVC's   GERD (gastroesophageal reflux disease)    Heart murmur    History of hiatal hernia    Hyperlipidemia    Hypertension 11/20/11   ECHO- EF>55% Borderline concentric left ventricular hypertrophy. There is a small calcified mass in the L:A near the LA appendage. No valvular masses seen with associated mitral annular calcification. LA Volume/ BSA27.4 ml/m2 No AS. 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  PTCAs with drug coated balloon Lt ant tibial artery and Lt popliteal artery  11/26/2019    Past Surgical History:  Procedure Laterality Date   ABDOMINAL AORTAGRAM  11/25/2019   ABDOMINAL AORTOGRAM W/LOWER EXTREMITY    ABDOMINAL AORTOGRAM N/A 06/15/2019   Procedure: ABDOMINAL AORTOGRAM;  Surgeon: BerryLorretta Harp  [facility];  Service: Cardiovascular;  Laterality: N/A;   ABDOMINAL AORTOGRAM W/LOWER EXTREMITY N/A 11/25/2019   Procedure: ABDOMINAL AORTOGRAM W/LOWER EXTREMITY;  Surgeon: AridaWellington Hampshire  Location: [place] [facility];  Service: Cardiovascular;  Laterality: N/A;  Lt leg   ABDOMINAL AORTOGRAM W/LOWER EXTREMITY N/A 08/01/2020   Procedure: ABDOMINAL AORTOGRAM W/LOWER EXTREMITY;  Surgeon: [name], MD;  Location: [place] [facility];  Service: Cardiovascular;   Laterality: N/A;   ABDOMINAL AORTOGRAM W/LOWER EXTREMITY N/A 04/28/2021   Procedure: ABDOMINAL AORTOGRAM W/LOWER EXTREMITY;  Surgeon: [name], MD;  Location: [place] [facility];  Service: Cardiovascular;  Laterality: N/A;   ABDOMINAL AORTOGRAM W/LOWER EXTREMITY N/A 10/13/2021   Procedure: ABDOMINAL AORTOGRAM W/LOWER EXTREMITY;  Surgeon: [name], MD;  Location: [place] [facility];  Service: Cardiovascular;  Laterality: N/A;   AMPUTATION TOE Left 05/03/2021   Procedure: AMPUTATION OF THIRD LEFT  TOE;  Surgeon: [name], MD;  Location[facility];  Service: Vascular;  Laterality: Left;   APPENDECTOMY     APPLICATION OF WOUND VAC Right 07/21/2019   Procedure: Application Of Prevena Wound Vac Right Groin;  Surgeon: [name], MD;  [facility];  Service: Vascular;  Laterality: Right;   CARDIAC CATHETERIZATION     CARPAL [place]     x 2   COLONOSCOPY     CORONARY ARTERY BYPASS GRAFT N/A 03/24/2020   Procedure: CORONARY ARTERY BYPASS GRAFTING (CABG) using LIMA to LAD (m); RIMA to RAMUS; Endoscopic Right Greater Saphenous Vein: SVG to Diag1; SVG to PLB (right); and SVG to PL (left).;  Surgeon: [name], MD;  Location: [place];  Service: Open Heart Surgery;  Laterality: N/A;  BILATERAL IMA   ENDARTERECTOMY FEMORAL Right 07/21/2019   Procedure: RIGHT ENDARTERECTOMY FEMORAL WITH PATCH ANGIOPLASTY;  Surgeon: [name], MD;  Location: [place];  Service: Vascular;  Laterality: Right;   ENDARTERECTOMY FEMORAL Right 08/16/2020   Procedure: RIGHT FEMORAL ENDARTERECTOMY;  Surgeon: [name], MD;  Location: [place];  Service: Vascular;  Laterality: Right;   ENDOVEIN HARVEST OF GREATER SAPHENOUS VEIN Right 03/24/2020   Procedure: ENDOVEIN HARVEST OF GREATER SAPHENOUS VEIN;  Surgeon: [name], MD;  Location: [place];  Service: Open Heart Surgery;  Laterality: Right;   EYE SURGERY     cataract removal  bilaterally   FEMORAL-POPLITEAL BYPASS GRAFT Right 08/16/2020   Procedure: BYPASS GRAFT FEMORAL-POPLITEAL ARTERY;  Surgeon: [name], MD;  Location: [place];  Service: Vascular;  Laterality: Right;   FEMORAL-POPLITEAL BYPASS GRAFT Left 05/03/2021   Procedure: LEFT FEMORAL TO BELOW KNEE POPLITEAL ARTERY BYPASS GRAFTING WITH 6MMX80 PTFE GRAFT;  Surgeon: [name], MD;  Location: [place];  Service: Vascular;  Laterality: Left;   INTRAVASCULAR LITHOTRIPSY  11/25/2019   Procedure: INTRAVASCULAR LITHOTRIPSY;  Surgeon: [name], MD;  Location: [place] CV LAB;  Service: Cardiovascular;;  LT. SFA   LEFT HEART CATH AND CORONARY ANGIOGRAPHY N/A 03/22/2020   Procedure: LEFT HEART CATH AND CORONARY ANGIOGRAPHY;  Surgeon: Martinique, Peter M, MD;  Location: [place] [facility] LAB;  Service: Cardiovascular;  Laterality: N/A;   LOWER EXTREMITY ANGIOGRAPHY Bilateral 06/15/2019   Procedure: Lower Extremity Angiography;  Surgeon: [name], MD;  Location: [place] [facility];  Service: Cardiovascular;  Laterality: Bilateral;   LOWER EXTREMITY ANGIOGRAPHY Right 08/03/2019   Procedure: LOWER EXTREMITY ANGIOGRAPHY;  Surgeon: [name], MD;  Location: [place] CV LAB;  Service: Cardiovascular;  Laterality: Right;   LOWER EXTREMITY ANGIOGRAPHY N/A 09/07/2021   Procedure: LOWER EXTREMITY ANGIOGRAPHY;  Surgeon: Marty Heck, MD;  Location: [place] [facility];  Service: Cardiovascular;  Laterality: N/A;   PATCH ANGIOPLASTY Right 07/21/2019   Procedure: Patch Angioplasty Right Femoral Artery;  Surgeon: [name], MD;  Location: [place];  Service: Vascular;  Laterality: Right;   PERIPHERAL VASCULAR ATHERECTOMY  08/03/2019   Procedure: PERIPHERAL VASCULAR ATHERECTOMY;  Surgeon: [name], MD;  Location: [place] [facility];  Service: Cardiovascular;;  right SFA, right TP trunk   PERIPHERAL VASCULAR ATHERECTOMY  11/25/2019   Procedure: PERIPHERAL VASCULAR ATHERECTOMY;   Surgeon: [name], MD;  Location: [place] CV LAB;  Service: Cardiovascular;;  Lt.  POPLITEAL and AT   PERIPHERAL VASCULAR BALLOON ANGIOPLASTY Left 06/15/2019   Procedure: PERIPHERAL VASCULAR BALLOON ANGIOPLASTY;  Surgeon: [name], MD;  Location: [place] [facility];  Service: Cardiovascular;  Laterality: Left;  SFA UNSUCCESSFUL UNABLE TO CROSS LESION   PERIPHERAL VASCULAR BALLOON ANGIOPLASTY  08/03/2019   Procedure: PERIPHERAL VASCULAR BALLOON ANGIOPLASTY;  Surgeon: [name], MD;  Location: [place] [facility];  Service: Cardiovascular;;  right SFA, Right TP trunk   PERIPHERAL VASCULAR BALLOON ANGIOPLASTY  09/07/2021   Procedure: PERIPHERAL VASCULAR BALLOON ANGIOPLASTY;  Surgeon: Marty Heck, MD;  Location: [place] [facility];  Service: Cardiovascular;;   PERIPHERAL VASCULAR BALLOON ANGIOPLASTY Right 10/13/2021   Procedure: PERIPHERAL VASCULAR BALLOON ANGIOPLASTY;  Surgeon: [name], MD;  Location: [place] [facility];  Service: Cardiovascular;  Laterality: Right;   TEE WITHOUT CARDIOVERSION N/A 03/24/2020   Procedure: TRANSESOPHAGEAL ECHOCARDIOGRAM (TEE);  Surgeon: [name], MD;  Location: [place];  Service: Open Heart Surgery;  Laterality: N/A;   TONSILLECTOMY     and adenoidectomy   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/2019   Procedure: TRANSMETATARSAL AMPUTATION;  Surgeon: Trula Slade, DPM;  Location: [place];  Service: Podiatry;  Laterality: Right;   TRANSMETATARSAL AMPUTATION Left 05/11/2021   Procedure: LEFT TRANSMETATARSAL AMPUTATION;  Surgeon: [name], MD;  Location: [facility] OR;  Service: Vascular;  Laterality: Left;   TUBAL LIGATION      Family History  Problem Relation Age of Onset   Cancer - Prostate Father    Cancer - Colon Father    Stroke Mother    Hypertension Mother    Hyperlipidemia Mother    Melanoma Brother     Social History   Socioeconomic History   Marital status: Widowed    Spouse name: Not on  file   Number of children: Not on file   Years of education: Not on file   Highest education level: Not on file  Occupational History   Not on file  Tobacco Use   Smoking status: Former    Types: Cigarettes    Quit date: 03/31/1972    Years since quitting: 49.9   Smokeless tobacco: Never  Vaping Use   Vaping Use: Never used  Substance and Sexual Activity   Alcohol use: No   Drug use: No   Sexual activity: Not on file  Other Topics Concern   Not on file  Social History Narrative   Not on file   Social Determinants of Health   Financial Resource Strain: Not on file  Food Insecurity: Not on file  Transportation Needs: Not on file  Physical Activity: Not on file  Stress: Not on file  Social Connections: Not on file  Intimate Partner Violence: Not on file    Allergies  Allergen Reactions   Augmentin [Amoxicillin-Pot Clavulanate] Other (See  Comments)    Elevates potassium level   Bactrim [Sulfamethoxazole-Trimethoprim] Other (See Comments)    ^ K+( elevated)    Demerol [Meperidine]     Delusional    Meperidine Hcl     Other reaction(s): delusional   Scopolamine     Delusional    Tramadol Other (See Comments)    Keeps awake   Versed [Midazolam] Anxiety    Frantic, out of my mind, agitated     Current Outpatient Medications  Medication Sig Dispense Refill   acetaminophen (TYLENOL) 500 MG tablet Take 1,000 mg by mouth every 6 (six) hours as needed for moderate pain or headache.     aspirin EC 81 MG tablet Take 81 mg by mouth at bedtime.      atorvastatin (LIPITOR) 80 MG tablet TAKE 1 TABLET BY MOUTH ONCE DAILY. PATIENT  MUST  KEEP  UPCOMING  APPOINTMENT  FOR  FUTURE  REFILLS 90 tablet 0   brimonidine (ALPHAGAN) 0.2 % ophthalmic solution Place 1 drop into the right eye 2 (two) times daily.     CINNAMON PO Take 2 tablets by mouth in the morning, at noon, and at bedtime.      FEROCON capsule Take 1 capsule by mouth 2 (two) times daily.     fluconazole (DIFLUCAN) 150 MG  tablet Take 1 tablet (150 mg total) by mouth daily. Can take 1 tab every 3 days if still having symptoms (Patient not taking: Reported on 02/07/2022) 5 tablet 0   glimepiride (AMARYL) 1 MG tablet Take 2 tablets (2 mg total) by mouth daily before breakfast. 90 tablet 2   glucose blood (ONETOUCH VERIO) test strip Use to check blood sugars once daily 100 each 5   hydrochlorothiazide (HYDRODIURIL) 12.5 MG tablet Take 1 tablet by mouth once daily 90 tablet 1   JARDIANCE 25 MG TABS tablet TAKE 1 TABLET BY MOUTH ONCE DAILY BEFORE BREAKFAST 30 tablet 1   Lancets (ONETOUCH DELICA PLUS [id number]) MISC Use to check blood sugars twice daily. 100 each 5   metFORMIN (GLUCOPHAGE) 1000 MG tablet Take 1 tablet by mouth twice daily (Patient taking differently: Take 1,000 mg by mouth in the morning.) 180 tablet 3   metoprolol tartrate (LOPRESSOR) 25 MG tablet TAKE 1 & 1/2 (ONE & ONE-HALF) TABLETS BY MOUTH TWICE DAILY 270 tablet 2   NON FORMULARY Prisma topical ointment for right foot (Patient not taking: Reported on 02/07/2022)     pantoprazole (PROTONIX) 40 MG tablet Take 1 tablet by mouth once daily 90 tablet 3   Probiotic Product ([employer] SENIOR PROBIOTIC PO) Take 1 capsule by mouth every evening.     Propylene Glycol (SYSTANE BALANCE) 0.6 % SOLN Place 1 drop into the left eye 3 (three) times daily as needed (dry/irritated eyes).     pyridoxine (B-6) 100 MG tablet Take 100 mg by mouth every evening.      repaglinide (PRANDIN) 2 MG tablet Take 1 tablet (2 mg total) by mouth daily before breakfast. 90 tablet 1   telmisartan (MICARDIS) 80 MG tablet Take 1 tablet by mouth once daily 90 tablet 3   XARELTO 2.5 MG TABS tablet Take 1 tablet by mouth twice daily 60 tablet 0   Current Facility-Administered Medications  Medication Dose Route Frequency Provider Last Rate Last Admin   sodium chloride flush (NS) 0.9 % injection 3 mL  3 mL Intravenous Q12H [name], MD        PHYSICAL EXAM There were no vitals filed for  this visit.   Constitutional: well appearing. no distress. Appears well nourished.  Neurologic: CN intact. no focal findings. no sensory loss. Psychiatric:  Mood and affect symmetric and appropriate. Eyes:  No icterus. No conjunctival pallor. Ears, nose, throat:  mucous membranes moist. Midline trachea.  Cardiac: regular rate and rhythm.  Respiratory:  unlabored. Abdominal:  soft, non-tender, non-distended.  Peripheral vascular: no palpable pedal pulses Extremity: no edema. no cyanosis. no pallor.  Skin: resolving ulceration of right TMA stump.  Lymphatic: no Stemmer's sign. n palpable lymphadenopathy.  PERTINENT LABORATORY AND RADIOLOGIC DATA  Most recent CBC    Latest Ref Rng & Units 10/27/2021    4:52 PM 10/13/2021    7:48 PM 10/13/2021    8:29 AM  CBC  WBC 4.0 - 10.5 K/uL 9.5   9.8     Hemoglobin 12.0 - 15.0 g/dL 10.4   9.4   12.6    Hematocrit 36.0 - 46.0 % 33.1   28.2   37.0    Platelets 150 - 400 K/uL 285   152        Most recent CMP    Latest Ref Rng & Units 02/01/2022    8:33 AM 11/28/2021    8:29 AM 10/27/2021    4:52 PM  CMP  Glucose 70 - 99 mg/dL 131   174   232    BUN 6 - 23 mg/dL '24   31   16    '$ Creatinine 0.40 - 1.20 mg/dL 1.00   1.02   0.94    Sodium 135 - 145 mEq/L 140   141   137    Potassium 3.5 - 5.1 mEq/L 4.3   4.4   3.9    Chloride 96 - 112 mEq/L 104   102   104    CO2 19 - 32 mEq/L '28   31   26    '$ Calcium 8.4 - 10.5 mg/dL 10.0   10.3   9.8      Renal function Estimated Creatinine Clearance: 41 mL/min (by C-G formula based on SCr of 1 mg/dL).  Hgb A1c MFr Bld (%)  Date Value  02/01/2022 6.8 (H)    LDL (calc)  Date Value Ref Range Status  04/06/2013 87 <100 mg/dL Final    Comment:    LDL-C is inaccurate if patient is nonfasting.   Reference Range: ---------------- Optimal:            <100 Near/Above Optimal: 100-129 Borderline High:    130-159 High:               160-189 Very High:          >=190       LDL Chol Calc (NIH)  Date  Value Ref Range Status  07/28/2020 54 0 - 99 mg/dL Final   LDL Cholesterol  Date Value Ref Range Status  10/14/2021 50 0 - 99 mg/dL Final    Comment:           Total Cholesterol/HDL:CHD Risk Coronary Heart Disease Risk Table                     Men   Women  1/2 Average Risk   3.4   3.3  Average Risk       5.0   4.4  2 X Average Risk   9.6   7.1  3 X Average Risk  23.4   11.0        Use the  calculated Patient Ratio above and the CHD Risk Table to determine the patient's CHD Risk.        ATP III CLASSIFICATION (LDL):  <100     mg/dL   Optimal  100-129  mg/dL   Near or Above                    Optimal  130-159  mg/dL   Borderline  160-189  mg/dL   High  >190     mg/dL   Very High Performed at [facility] [address], [place], [place] [address]      Vascular Imaging:  +-------+-----------+-----------+------------+------------+  ABI/TBIToday's ABIToday's TBIPrevious ABIPrevious TBI  +-------+-----------+-----------+------------+------------+  Right  0.97       amputated  0                         +-------+-----------+-----------+------------+------------+  Left   0.63       amputated  0.86                      +-------+-----------+-----------+------------+------------+  \\  Arterial duplex of right lower extremity personally reviewed.  No evidence of hemodynamically significant stenosis in the right lower extremity.  [name]. Stanford Breed, MD [facility] Phone Number: [phone] 02/12/2022 5:46 PM  Total time spent on preparing this encounter including chart review, data review, collecting history, examining the patient, coordinating care for this established patient, 30 minutes.  Portions of this report may have been transcribed using voice recognition software.  Every effort has been made to ensure accuracy; however, inadvertent computerized transcription errors may still be present.

## 2022-02-13 ENCOUNTER — Encounter: Payer: Self-pay | Admitting: Vascular Surgery

## 2022-02-13 ENCOUNTER — Ambulatory Visit: Payer: Medicare Other

## 2022-02-13 ENCOUNTER — Ambulatory Visit (HOSPITAL_COMMUNITY)
Admission: RE | Admit: 2022-02-13 | Discharge: 2022-02-13 | Disposition: A | Discharge status: Home / Self Care | Payer: Medicare Other | Source: Ambulatory Visit | Attending: Vascular Surgery | Admitting: Vascular Surgery

## 2022-02-13 ENCOUNTER — Ambulatory Visit (INDEPENDENT_AMBULATORY_CARE_PROVIDER_SITE_OTHER): Payer: Medicare Other | Admitting: Vascular Surgery

## 2022-02-13 ENCOUNTER — Telehealth: Payer: Self-pay

## 2022-02-13 VITALS — BP 162/68 | HR 77 | Temp 98.3°F | Resp 20 | Ht 65.0 in | Wt 144.0 lb

## 2022-02-13 DIAGNOSIS — E1142 Type 2 diabetes mellitus with diabetic polyneuropathy: Secondary | ICD-10-CM

## 2022-02-13 DIAGNOSIS — I739 Peripheral vascular disease, unspecified: Secondary | ICD-10-CM | POA: Diagnosis not present

## 2022-02-13 DIAGNOSIS — Z89432 Acquired absence of left foot: Secondary | ICD-10-CM

## 2022-02-13 DIAGNOSIS — Z89431 Acquired absence of right foot: Secondary | ICD-10-CM

## 2022-02-13 NOTE — Telephone Encounter (Signed)
CMN Submitted

## 2022-02-13 NOTE — Progress Notes (Signed)
SITUATION Reason for Consult: Evaluation for Prefabricated Diabetic Shoes and Custom Diabetic Inserts. Patient / Caregiver Report: Patient would like well fitting shoes  OBJECTIVE DATA: Patient History / Diagnosis:    ICD-10-CM   1. Type 2 diabetes mellitus with diabetic polyneuropathy, without long-term current use of insulin (HCC)  E11.42     2. History of transmetatarsal amputation of left foot ([place])  Z89.432     3. History of transmetatarsal amputation of right foot [facility])  Z89.431       Physician Treating Diabetes:  Elayne Snare  Current or Previous Devices:   None and no history  In-Person Foot Examination: Ulcers & Callousing:   Historical Deformities:    Bilateral transmet Sensation:    compromised  Shoe Size:     85M  ORTHOTIC RECOMMENDATION Recommended Devices: - 1x pair prefabricated PDAC approved diabetic shoes; Patient Selected Shon Millet Black 849 Size 85M - 1x pair custom-to-patient PDAC approved vacuum formed partial foot prostheses.  GOALS OF SHOES AND INSOLES - Reduce shear and pressure - Reduce / Prevent callus formation - Reduce / Prevent ulceration - Protect the fragile healing compromised diabetic foot.  Patient would benefit from diabetic shoes and inserts as patient has diabetes mellitus and the patient has one or more of the following conditions: - History of partial or complete amputation of the foot - History of previous foot ulceration. - History of pre-ulcerative callus - Peripheral neuropathy with evidence of callus formation - Foot deformity - Poor circulation  ACTIONS PERFORMED Potential out of pocket cost was communicated to patient. Patient understood and consented to measurement and casting. Patient was casted for insoles via crush box and measured for shoes via brannock device. Procedure was explained and patient tolerated procedure well. All questions were answered and concerns addressed. Casts were shipped to central fabrication for  HOLD until Certificate of Medical Necessity or otherwise necessary authorization from insurance is obtained.  PLAN Shoes are to be ordered and casts released from hold once all appropriate paperwork is complete. Patient is to be contacted and scheduled for fitting once shoes and insoles have been fabricated and received.

## 2022-02-15 ENCOUNTER — Other Ambulatory Visit: Payer: Self-pay | Admitting: *Deleted

## 2022-02-15 DIAGNOSIS — I739 Peripheral vascular disease, unspecified: Secondary | ICD-10-CM

## 2022-02-15 DIAGNOSIS — Z48812 Encounter for surgical aftercare following surgery on the circulatory system: Secondary | ICD-10-CM

## 2022-02-17 ENCOUNTER — Other Ambulatory Visit: Payer: Self-pay | Admitting: Cardiovascular Disease

## 2022-02-17 ENCOUNTER — Encounter: Payer: Self-pay | Admitting: Podiatry

## 2022-02-17 ENCOUNTER — Other Ambulatory Visit: Payer: Self-pay | Admitting: Vascular Surgery

## 2022-02-17 DIAGNOSIS — E113393 Type 2 diabetes mellitus with moderate nonproliferative diabetic retinopathy without macular edema, bilateral: Secondary | ICD-10-CM | POA: Insufficient documentation

## 2022-02-17 DIAGNOSIS — Z800 Family history of malignant neoplasm of digestive organs: Secondary | ICD-10-CM

## 2022-02-17 DIAGNOSIS — D72829 Elevated white blood cell count, unspecified: Secondary | ICD-10-CM | POA: Insufficient documentation

## 2022-02-17 DIAGNOSIS — M86172 Other acute osteomyelitis, left ankle and foot: Secondary | ICD-10-CM | POA: Insufficient documentation

## 2022-02-17 DIAGNOSIS — D696 Thrombocytopenia, unspecified: Secondary | ICD-10-CM | POA: Insufficient documentation

## 2022-02-17 DIAGNOSIS — L98421 Non-pressure chronic ulcer of back limited to breakdown of skin: Secondary | ICD-10-CM | POA: Insufficient documentation

## 2022-02-17 DIAGNOSIS — I96 Gangrene, not elsewhere classified: Secondary | ICD-10-CM | POA: Insufficient documentation

## 2022-02-17 DIAGNOSIS — H9319 Tinnitus, unspecified ear: Secondary | ICD-10-CM | POA: Insufficient documentation

## 2022-02-17 DIAGNOSIS — E7800 Pure hypercholesterolemia, unspecified: Secondary | ICD-10-CM | POA: Insufficient documentation

## 2022-02-17 DIAGNOSIS — E1165 Type 2 diabetes mellitus with hyperglycemia: Secondary | ICD-10-CM | POA: Insufficient documentation

## 2022-02-17 DIAGNOSIS — D509 Iron deficiency anemia, unspecified: Secondary | ICD-10-CM | POA: Insufficient documentation

## 2022-02-17 DIAGNOSIS — K219 Gastro-esophageal reflux disease without esophagitis: Secondary | ICD-10-CM | POA: Insufficient documentation

## 2022-02-17 HISTORY — DX: Other acute osteomyelitis, left ankle and foot: M86.172

## 2022-02-17 HISTORY — DX: Family history of malignant neoplasm of digestive organs: Z80.0

## 2022-02-17 NOTE — Progress Notes (Signed)
ANNUAL DIABETIC FOOT EXAM  Subjective: [name] presents today for diabetic foot evaluation.  Patient relates 81 year h/o diabetes.  Patient has h/o bilateral transmetatarsal amputations.  Patient's blood sugar was 119 mg/dl . Last known  HgA1c was 6.8%   Risk factors: diabetes, PAD, h/o MI, HTN.  [name] Smoker, MD is patient's PCP. Last visit was Jan 29, 2022.  Past Medical History:  Diagnosis Date   Anemia    after CABG in june 2021   Arthritis    Cancer ([place])    removal from nose - MOSE procedure    Complication of anesthesia    VERSED- agitated, muscle spasms, "jerking" , frantic , (never had this occurence in the pas)    Coronary artery disease    Diabetes mellitus without complication ([name])    Dysrhythmia    PVC's   GERD (gastroesophageal reflux disease)    Heart murmur    History of hiatal hernia    Hyperlipidemia    Hypertension 11/20/11   ECHO- EF>55% Borderline concentric left ventricular hypertrophy. There is a small calcified mass in the L:A near the LA appendage. No valvular masses seen with associated mitral annular calcification. LA Volume/ BSA27.4 ml/m2 No AS. Right ventricular systolic pressure is elevated at 17mHg.   Myocardial infarction ([name]    June 2021   Neuromuscular disorder ([name]    neuropathy in bilateral feet   Peripheral vascular disease ([name]    Pneumonia    not hosp.    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  PTCAs with drug coated balloon Lt ant tibial artery and Lt popliteal artery  11/26/2019   Patient Active Problem List   Diagnosis Date Noted   Family history of malignant neoplasm of digestive organ 02/17/2022   Gangrenous disorder ([name] 02/17/2022   Gastro-esophageal reflux disease without esophagitis 02/17/2022   Hyperglycemia due to type 2 diabetes mellitus ([name] 02/17/2022   Iron deficiency anemia 02/17/2022   Leukocytosis 02/17/2022   Moderate nonproliferative diabetic retinopathy of both eyes without  macular edema associated with type 2 diabetes mellitus ([name] 02/17/2022   Other acute osteomyelitis, left ankle and foot ([name] 02/17/2022   Pure hypercholesterolemia 02/17/2022   Skin ulcer of sacrum, limited to breakdown of skin ([place] 02/17/2022   Thrombocytopenic disorder ([name] 02/17/2022   Tinnitus 02/17/2022   S/P angiogram of extremity 10/13/2021   Osteomyelitis of foot, left, acute ([name] 04/27/2021   Cellulitis in diabetic foot ([name] 04/27/2021   Acute kidney injury superimposed on chronic kidney disease ([name] 04/27/2021   Hyponatremia 04/27/2021   Anemia, chronic disease 04/27/2021   Cellulitis of left lower extremity    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  PTCAs with drug coated balloon Lt ant tibial artery and Lt popliteal artery  11/26/2019   Gangrene of foot ([place] 08/07/2019   Normocytic anemia 08/07/2019   S/P transmetatarsal amputation of foot ([name] 08/07/2019   Critical lower limb ischemia ([name] 08/03/2019   Encounter for immunization 07/09/2016   Essential hypertension 03/31/2013   Dyslipidemia 03/31/2013   DM2 (diabetes mellitus, type 2) ([place] 03/31/2013   Overweight (BMI 25.0-29.9) 03/31/2013   Carotid artery disease ([name] 03/31/2013  PAD (peripheral artery disease) ([name]) 03/31/2013   Past Surgical History:  Procedure Laterality Date   ABDOMINAL AORTAGRAM  11/25/2019   ABDOMINAL AORTOGRAM W/LOWER EXTREMITY    ABDOMINAL AORTOGRAM N/A 06/15/2019   Procedure: ABDOMINAL AORTOGRAM;  Surgeon: [name], MD;  Location: [name];  Service: Cardiovascular;  Laterality: N/A;   ABDOMINAL AORTOGRAM W/LOWER EXTREMITY N/A  11/25/2019   Procedure: ABDOMINAL AORTOGRAM W/LOWER EXTREMITY;  Surgeon: [name] Hampshire, MD;  Location: [place] [name];  Service: Cardiovascular;  Laterality: N/A;  Lt leg   ABDOMINAL AORTOGRAM W/LOWER EXTREMITY N/A 08/01/2020   Procedure: ABDOMINAL AORTOGRAM W/LOWER EXTREMITY;  Surgeon: [name], MD;  Location: [name];  Service: Cardiovascular;  Laterality: N/A;   ABDOMINAL AORTOGRAM W/LOWER EXTREMITY N/A 04/28/2021   Procedure: ABDOMINAL AORTOGRAM W/LOWER EXTREMITY;  Surgeon: [name], MD;  Location: [name];  Service: Cardiovascular;  Laterality: N/A;   ABDOMINAL AORTOGRAM W/LOWER EXTREMITY N/A 10/13/2021   Procedure: ABDOMINAL AORTOGRAM W/LOWER EXTREMITY;  Surgeon: [name], MD;  Location: [name];  Service: Cardiovascular;  Laterality: N/A;   AMPUTATION TOE Left 05/03/2021   Procedure: AMPUTATION OF THIRD LEFT  TOE;  Surgeon: [name], MD;  Location: [name];  Service: Vascular;  Laterality: Left;   APPENDECTOMY     APPLICATION OF WOUND VAC Right 07/21/2019   Procedure: Application Of Prevena Wound Vac Right Groin;  Surgeon: [name], MD;  Location: [name];  Service: Vascular;  Laterality: Right;   CARDIAC CATHETERIZATION     CARPAL Gilliam     x 2   COLONOSCOPY     CORONARY ARTERY BYPASS GRAFT N/A 03/24/2020   Procedure: CORONARY ARTERY BYPASS GRAFTING (CABG) using LIMA to LAD (m); RIMA to RAMUS; Endoscopic Right Greater Saphenous Vein: SVG to Diag1; SVG to PLB (right); and SVG to PL (left).;  Surgeon: [name], MD;  Location: [name];  Service: Open Heart Surgery;  Laterality: N/A;  BILATERAL IMA   ENDARTERECTOMY FEMORAL Right 07/21/2019   Procedure: RIGHT ENDARTERECTOMY FEMORAL WITH PATCH ANGIOPLASTY;  Surgeon: [name], MD;  Location: [name];  Service: Vascular;  Laterality: Right;   ENDARTERECTOMY FEMORAL Right 08/16/2020   Procedure: RIGHT  FEMORAL ENDARTERECTOMY;  Surgeon: [name], MD;  Location: [name];  Service: Vascular;  Laterality: Right;   ENDOVEIN HARVEST OF GREATER SAPHENOUS VEIN Right 03/24/2020   Procedure: ENDOVEIN HARVEST OF GREATER SAPHENOUS VEIN;  Surgeon: [name], MD;  Location: [place];  Service: Open Heart Surgery;  Laterality: Right;   EYE SURGERY     cataract removal bilaterally   FEMORAL-POPLITEAL BYPASS GRAFT Right 08/16/2020   Procedure: BYPASS GRAFT FEMORAL-POPLITEAL ARTERY;  Surgeon: [name], MD;  Location: [place];  Service: Vascular;  Laterality: Right;   FEMORAL-POPLITEAL BYPASS GRAFT Left 05/03/2021   Procedure: LEFT FEMORAL TO BELOW KNEE POPLITEAL ARTERY BYPASS GRAFTING WITH 6MMX80 PTFE GRAFT;  Surgeon: [name], MD;  Location: [place];  Service: Vascular;  Laterality: Left;   INTRAVASCULAR LITHOTRIPSY  11/25/2019   Procedure: INTRAVASCULAR LITHOTRIPSY;  Surgeon: [name] Hampshire, MD;  Location: [place] [name];  Service: Cardiovascular;;  LT. SFA   LEFT HEART CATH AND CORONARY ANGIOGRAPHY N/A 03/22/2020   Procedure: LEFT HEART CATH AND CORONARY ANGIOGRAPHY;  Surgeon: Martinique, Peter M, MD;  Location: [place] [name];  Service: Cardiovascular;  Laterality: N/A;   LOWER EXTREMITY ANGIOGRAPHY Bilateral 06/15/2019   Procedure: Lower Extremity Angiography;  Surgeon: [name], MD;  Location: [place] [name];  Service: Cardiovascular;  Laterality: Bilateral;   LOWER EXTREMITY ANGIOGRAPHY Right 08/03/2019   Procedure: LOWER EXTREMITY ANGIOGRAPHY;  Surgeon: [name], MD;  Location: [place] [name];  Service: Cardiovascular;  Laterality: Right;   LOWER EXTREMITY ANGIOGRAPHY N/A 09/07/2021   Procedure: LOWER EXTREMITY ANGIOGRAPHY;  Surgeon: Marty Heck, MD;  Location: [place] [name];  Service: Cardiovascular;  Laterality: N/A;   PATCH ANGIOPLASTY Right 07/21/2019   Procedure: Patch Angioplasty Right Femoral Artery;  Surgeon: [name], MD;  Location: [place];  Service: Vascular;  Laterality: Right;   PERIPHERAL VASCULAR ATHERECTOMY  08/03/2019   Procedure: PERIPHERAL VASCULAR ATHERECTOMY;  Surgeon: [name], MD;  Location: [place] [name];  Service: Cardiovascular;;  right SFA, right TP trunk   PERIPHERAL VASCULAR ATHERECTOMY  11/25/2019   Procedure: PERIPHERAL VASCULAR ATHERECTOMY;  Surgeon: [name] Hampshire, MD;  Location: [place] [name];  Service: Cardiovascular;;  Lt.  POPLITEAL and AT   PERIPHERAL VASCULAR BALLOON ANGIOPLASTY Left 06/15/2019   Procedure: PERIPHERAL VASCULAR BALLOON ANGIOPLASTY;  Surgeon: [name], MD;  Location: [place] [name];  Service: Cardiovascular;  Laterality: Left;  SFA UNSUCCESSFUL UNABLE TO CROSS LESION   PERIPHERAL VASCULAR BALLOON ANGIOPLASTY  08/03/2019   Procedure: PERIPHERAL VASCULAR BALLOON ANGIOPLASTY;  Surgeon: [name], MD;  Location: [place] [name];  Service: Cardiovascular;;  right SFA, Right TP trunk   PERIPHERAL VASCULAR BALLOON ANGIOPLASTY  09/07/2021   Procedure: PERIPHERAL VASCULAR BALLOON ANGIOPLASTY;  Surgeon: Marty Heck, MD;  Location: [place] [name];  Service: Cardiovascular;;   PERIPHERAL VASCULAR BALLOON ANGIOPLASTY Right 10/13/2021   Procedure: PERIPHERAL VASCULAR BALLOON ANGIOPLASTY;  Surgeon: [name], MD;  Location: [place] [name];  Service: Cardiovascular;  Laterality: Right;   TEE WITHOUT CARDIOVERSION N/A 03/24/2020   Procedure: TRANSESOPHAGEAL ECHOCARDIOGRAM (TEE);  Surgeon: [name], MD;  Location: [place];  Service: Open Heart Surgery;  Laterality: N/A;   TONSILLECTOMY     and adenoidectomy   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/2019   Procedure: TRANSMETATARSAL AMPUTATION;  Surgeon: Trula Slade, DPM;  Location: [place];  Service: Podiatry;  Laterality: Right;   TRANSMETATARSAL AMPUTATION Left 05/11/2021   Procedure: LEFT TRANSMETATARSAL AMPUTATION;  Surgeon: [name],  MD;  Location: [place];  Service: Vascular;  Laterality: Left;   TUBAL LIGATION     Current Outpatient Medications on File Prior to Visit  Medication Sig Dispense Refill   acetaminophen (TYLENOL) 500 MG tablet Take 1,000 mg by mouth every 6 (six) hours as needed for moderate pain [name] headache.     aspirin EC 81 MG tablet Take 81 mg by mouth at bedtime.      atorvastatin (LIPITOR) 80 MG tablet TAKE 1 TABLET BY MOUTH ONCE DAILY. PATIENT  MUST  KEEP  UPCOMING  APPOINTMENT  FOR  FUTURE  REFILLS 90 tablet 0   brimonidine (ALPHAGAN) 0.2 % ophthalmic solution Place 1 drop into the right eye 2 (two) times daily.     CINNAMON PO Take 2 tablets by mouth in the morning, at noon, and at bedtime.      glimepiride (AMARYL) 1 MG tablet Take 2 tablets (2 mg total) by mouth daily before breakfast. 90 tablet 2   glucose blood (ONETOUCH VERIO) test strip Use to check blood sugars once  daily 100 each 5   hydrochlorothiazide (HYDRODIURIL) 12.5 MG tablet Take 1 tablet by mouth once daily 90 tablet 1   JARDIANCE 25 MG TABS tablet TAKE 1 TABLET BY MOUTH ONCE DAILY BEFORE BREAKFAST 30 tablet 1   Lancets (ONETOUCH DELICA PLUS [id number]) MISC Use to check blood sugars twice daily. 100 each 5   metFORMIN (GLUCOPHAGE) 1000 MG tablet Take 1 tablet by mouth twice daily (Patient taking differently: Take 1,000 mg by mouth in the morning.) 180 tablet 3   metoprolol tartrate (LOPRESSOR) 25 MG tablet TAKE 1 & 1/2 (ONE & ONE-HALF) TABLETS BY MOUTH TWICE DAILY 270 tablet 2   pantoprazole (PROTONIX) 40 MG tablet Take 1 tablet by mouth once daily 90 tablet 3   Probiotic Product ([employer] SENIOR PROBIOTIC PO) Take 1 capsule by mouth every evening.     Propylene Glycol (SYSTANE BALANCE) 0.6 % SOLN Place 1 drop into the left eye 3 (three) times daily as needed (dry/irritated eyes).     pyridoxine (B-6) 100 MG tablet Take 100 mg by mouth every evening.      repaglinide (PRANDIN) 2 MG tablet Take 1 tablet (2 mg total) by mouth daily before  breakfast. 90 tablet 1   telmisartan (MICARDIS) 80 MG tablet Take 1 tablet by mouth once daily 90 tablet 3   Current Facility-Administered Medications on File Prior to Visit  Medication Dose Route Frequency Provider Last Rate Last Admin   sodium chloride flush (NS) 0.9 % injection 3 mL  3 mL Intravenous Q12H [name], MD        Allergies  Allergen Reactions   Augmentin [Amoxicillin-Pot Clavulanate] Other (See Comments)    Elevates potassium level   Bactrim [Sulfamethoxazole-Trimethoprim] Other (See Comments)    ^ K+( elevated)    Demerol [Meperidine]     Delusional    Meperidine Hcl     Other reaction(s): delusional   Scopolamine     Delusional    Tramadol Other (See Comments)    Keeps awake   Versed [Midazolam] Anxiety    Frantic, out of my mind, agitated    Social History   Occupational History   Not on file  Tobacco Use   Smoking status: Former    Types: Cigarettes    Quit date: 03/31/1972    Years since quitting: 49.9   Smokeless tobacco: Never  Vaping Use   Vaping Use: Never used  Substance and Sexual Activity   Alcohol use: No   Drug use: No   Sexual activity: Not on file   Family History  Problem Relation Age of Onset   Cancer - Prostate Father    Cancer - Colon Father    Stroke Mother    Hypertension Mother    Hyperlipidemia Mother    Melanoma Brother    Immunization History  Administered Date(s) Administered   Fluad Quad(high Dose 65+) 07/02/2019   Influenza Split 09/01/2007, 06/29/2009, 07/06/2011, 06/12/2012, 08/16/2014, 07/10/2017, 07/02/2019   Influenza,inj,Quad PF,6+ Mos 07/09/2016, 07/11/2018   Influenza,inj,quad, With Preservative 09/15/2015   Influenza-Unspecified 07/31/2021   PFIZER(Purple Top)SARS-COV-2 Vaccination 12/16/2019, 01/06/2020, 07/14/2020   Pfizer Covid-19 Vaccine Bivalent Booster 28yr & up 10/12/2021   Pneumococcal Conjugate-13 09/01/2014   Pneumococcal Polysaccharide-23 05/13/2007   Td 09/16/2002   Tdap 01/22/2011    Zoster Recombinat (Shingrix) 05/22/2018, 08/01/2018   Zoster, Live 02/28/2006, 05/22/2018, 08/01/2018     Review of Systems: Negative except as noted in the HPI.   Objective: There were no vitals filed for this visit.  [name] FEUERBORN is a pleasant 81 y.o. female in NAD. AAO X 3.  Vascular Examination: CFT delayed b/l LE. Diminished DP pulse(s) b/l LE. Diminished PT pulse(s) b/l LE. No pain with calf compression b/l. Lower extremity skin temperature gradient within normal limits. No edema noted b/l LE. No ischemia [name] gangrene noted b/l LE.  Dermatological Examination: Pedal skin thin and atrophic b/l LE. Mild hyperkeratosis on distal TMA site medially right foot. No open wounds b/l LE.  Neurological Examination: Protective sensation diminished with 10g monofilament b/l.  Musculoskeletal Examination: Muscle strength 5/5 to all lower extremity muscle groups bilaterally. Wearing Darco shoes b/l.Marland Kitchen Lower extremity amputation(s): Transmetatarsal amputation b/l lower extremities.  Footwear Assessment: Does the patient wear appropriate shoes? Yes. Does the patient need inserts/orthotics? Yes, she needs toe fillers b/l due to bilateral TMAs.     Latest Ref Rng & Units 02/01/2022    8:33 AM 09/29/2021    8:14 AM 06/28/2021    8:25 AM 04/27/2021    6:11 PM  Hemoglobin A1C  Hemoglobin-A1c 4.6 - 6.5 % 6.8   7.6   5.8   7.2      Assessment: 1. Type 2 diabetes mellitus with diabetic polyneuropathy, without long-term current use of insulin ([place])   2. History of transmetatarsal amputation of left foot ([place])   3. History of transmetatarsal amputation of right foot ([place])   4. PAD (peripheral artery disease) ([facility])    High Risk  Patient has one [name] more of the following: Loss of protective sensation Absent pedal pulses Severe Foot deformity History of foot ulcer  Plan: -Patient was evaluated and treated. All patient's and/[name] POA's questions/concerns answered on today's visit. -Consent given  for treatment as described below: -Order placed for one pair extra depth shoes and toe filler for left shoe and toe filler for right shoe. Will expedite these for her as she has been wearing Darco shoes.. -Diabetic foot examination performed today. -Continue diabetic foot care principles: inspect feet daily, monitor glucose as recommended by PCP and/[name] Endocrinologist, and follow prescribed diet per PCP, Endocrinologist and/[name] dietician. -Will have her follow up with Dr. Jacqualyn Posey for high risk foot care. -Patient/POA to call should there be question/concern in the interim. Return in about 3 months (around 05/15/2022).  Marzetta Board, DPM

## 2022-02-27 NOTE — Patient Outreach (Signed)
[facility]) Care Management  02/27/2022  [name] [birth year] [id number]   Received referral for Care Management from Insurance plan. Assigned patient to Deloria Lair, RN Care Coordinator for follow up.   [place] Management Assistant [phone]

## 2022-03-08 ENCOUNTER — Telehealth: Payer: Self-pay

## 2022-03-08 NOTE — Telephone Encounter (Signed)
CMN Received - Shoes ordered and casts released from fabrication hold.  

## 2022-03-21 ENCOUNTER — Other Ambulatory Visit: Payer: Self-pay | Admitting: Vascular Surgery

## 2022-03-21 ENCOUNTER — Other Ambulatory Visit: Payer: Self-pay | Admitting: Endocrinology

## 2022-03-22 ENCOUNTER — Other Ambulatory Visit: Payer: Self-pay | Admitting: *Deleted

## 2022-03-22 NOTE — Patient Outreach (Signed)
[facility]) Care Management  03/22/2022  MACKLYN GLANDON 10-02-1940 [id number]  Initial outreach for [place] Management services. Spoke with Mrs. Slavick. Informed of services. She reports she has been called multiple times for same reason and she does NOT wish to be called again. Apologized for the intrusion, thanked her for her time. Marked Epic chart in [facility] - DO NOT CALL FOR CARE MANAGEMENT SERVICES. She does not want a letter or brochure.  Eulah Pont. Myrtie Neither, MSN, [facility] Gerontological Nurse Practitioner [facility] Care Management [phone]

## 2022-03-23 ENCOUNTER — Other Ambulatory Visit: Payer: Self-pay | Admitting: Cardiology

## 2022-03-26 ENCOUNTER — Encounter (HOSPITAL_BASED_OUTPATIENT_CLINIC_OR_DEPARTMENT_OTHER): Payer: Medicare Other | Attending: General Surgery | Admitting: General Surgery

## 2022-03-26 DIAGNOSIS — B9689 Other specified bacterial agents as the cause of diseases classified elsewhere: Secondary | ICD-10-CM | POA: Diagnosis not present

## 2022-03-26 DIAGNOSIS — E1151 Type 2 diabetes mellitus with diabetic peripheral angiopathy without gangrene: Secondary | ICD-10-CM | POA: Diagnosis not present

## 2022-03-26 DIAGNOSIS — L97512 Non-pressure chronic ulcer of other part of right foot with fat layer exposed: Secondary | ICD-10-CM | POA: Insufficient documentation

## 2022-03-26 DIAGNOSIS — Z09 Encounter for follow-up examination after completed treatment for conditions other than malignant neoplasm: Secondary | ICD-10-CM | POA: Insufficient documentation

## 2022-03-26 DIAGNOSIS — E11621 Type 2 diabetes mellitus with foot ulcer: Secondary | ICD-10-CM | POA: Diagnosis not present

## 2022-03-26 DIAGNOSIS — B964 Proteus (mirabilis) (morganii) as the cause of diseases classified elsewhere: Secondary | ICD-10-CM | POA: Diagnosis not present

## 2022-03-26 DIAGNOSIS — Z1629 Resistance to other single specified antibiotic: Secondary | ICD-10-CM | POA: Diagnosis not present

## 2022-03-26 NOTE — Progress Notes (Signed)
[name], [name] ([id number]) Visit Report for 03/26/2022 Abuse Risk Screen Details Patient Name: Date of Service: [name] 03/26/2022 8:00 A M Medical Record Number: [id number] Patient Account Number: [id number] Date of Birth/Sex: Treating RN: Feb 23, 1941 (81 y.o. [name] Primary Care Andreah Goheen: [name] Other Clinician: Referring Dinora Hemm: Treating Mahayla Haddaway/[name]: Mardi Mainland in Treatment: 0 Abuse Risk Screen Items Answer ABUSE RISK SCREEN: Has anyone close to you tried to hurt or harm you recentlyo No Do you feel uncomfortable with anyone in your familyo No Has anyone forced you do things that you didnt want to doo No Electronic Signature(s) Signed: 03/26/2022 9:11:25 AM By: Dellie Catholic RN Entered By: Dellie Catholic on 03/26/2022 08:02:26 -------------------------------------------------------------------------------- Activities of Daily Living Details Patient Name: Date of Service: [name] 03/26/2022 8:00 A M Medical Record Number: [id number] Patient Account Number: [id number] Date of Birth/Sex: Treating RN: 01-04-1941 (81 y.o. [name] Primary Care Cherry Turlington: [name] Other Clinician: Referring Krithi Bray: Treating Samil Mecham/[name]: Mardi Mainland in Treatment: 0 Activities of Daily Living Items Answer Activities of Daily Living (Please select one for each item) Drive Automobile Completely Able T Medications ake Completely Able Use T elephone Completely Able Care for Appearance Completely Able Use T oilet Completely Able Bath / Shower Completely Able Dress Self Completely Able Feed Self Completely Able Walk Completely Able Get In / Out Bed Completely Able Housework Completely Able Prepare Meals Completely [place] for Self Completely Able Electronic Signature(s) Signed: 03/26/2022 9:11:25 AM By: Dellie Catholic RN Entered  By: Dellie Catholic on 03/26/2022 08:02:54 -------------------------------------------------------------------------------- Education Screening Details Patient Name: Date of Service: [name], [name] 03/26/2022 8:00 A M Medical Record Number: [id number] Patient Account Number: [id number] Date of Birth/Sex: Treating RN: 1940-10-27 (81 y.o. [name] Primary Care Makensie Mulhall: [name] Other Clinician: Referring Leshae Mcclay: Treating Coleen Cardiff/[name]: Mardi Mainland in Treatment: 0 Primary Learner Assessed: Patient Learning Preferences/Education Level/Primary Language Learning Preference: Explanation, Demonstration, Printed Material Highest Education Level: College or Above Preferred Language: English Cognitive Barrier Language Barrier: No Translator Needed: No Memory Deficit: No Emotional Barrier: No Cultural/Religious Beliefs Affecting Medical Care: No Physical Barrier Impaired Vision: No Impaired Hearing: No Decreased Hand dexterity: No Knowledge/Comprehension Knowledge Level: High Comprehension Level: High Ability to understand written instructions: High Ability to understand verbal instructions: High Motivation Anxiety Level: Calm Cooperation: Cooperative Education Importance: Acknowledges Need Interest in Health Problems: Asks Questions Perception: Coherent Willingness to Engage in Self-Management High Activities: Readiness to Engage in Self-Management High Activities: Electronic Signature(s) Signed: 03/26/2022 9:11:25 AM By: Dellie Catholic RN Entered By: Dellie Catholic on 03/26/2022 08:03:41 -------------------------------------------------------------------------------- Fall Risk Assessment Details Patient Name: Date of Service: [name], [place]. 03/26/2022 8:00 A M Medical Record Number: [id number] Patient Account Number: [id number] Date of Birth/Sex: Treating RN: Feb 04, 1941 (81 y.o. [name] Primary Care  Winfield Caba: [name] Other Clinician: Referring Saran Laviolette: Treating Aaleah Hirsch/[name]: Mardi Mainland in Treatment: 0 Fall Risk Assessment Items Have you had 2 or more falls in the last 12 monthso 0 No Have you had any fall that resulted in injury in the last 12 monthso 0 No FALLS RISK SCREEN History of falling - immediate or within 3 months 0 No Secondary diagnosis (Do you have 2 or more medical diagnoseso) 0 No Ambulatory aid None/bed rest/wheelchair/nurse 0 No Crutches/cane/walker 0 No Furniture 0 No Intravenous therapy Access/Saline/Heparin Lock 0 No Gait/Transferring Normal/ bed rest/ wheelchair  0 No Weak (short steps with or without shuffle, stooped but able to lift head while walking, may seek 0 No support from furniture) Impaired (short steps with shuffle, may have difficulty arising from chair, head down, impaired 0 No balance) Mental Status Oriented to own ability 0 No Electronic Signature(s) Signed: 03/26/2022 9:11:25 AM By: Dellie Catholic RN Entered By: Dellie Catholic on 03/26/2022 08:03:53 -------------------------------------------------------------------------------- Foot Assessment Details Patient Name: Date of Service: [name], [place]. 03/26/2022 8:00 A M Medical Record Number: [id number] Patient Account Number: [id number] Date of Birth/Sex: Treating RN: January 20, 1941 (81 y.o. [name] Primary Care Dezaria Methot: [name] Other Clinician: Referring Malie Kashani: Treating Jhonatan Lomeli/[name]: Mardi Mainland in Treatment: 0 Foot Assessment Items Site Locations + = Sensation present, - = Sensation absent, C = Callus, U = Ulcer R = Redness, W = Warmth, M = Maceration, PU = Pre-ulcerative lesion F = Fissure, S = Swelling, D = Dryness Assessment Right: Left: Other Deformity: No No Prior Foot Ulcer: No No Prior Amputation: Yes No Charcot Joint: No No Ambulatory Status: Ambulatory  Without Help Gait: Steady Electronic Signature(s) Signed: 03/26/2022 9:11:25 AM By: Dellie Catholic RN Entered By: Dellie Catholic on 03/26/2022 08:08:02 -------------------------------------------------------------------------------- Nutrition Risk Screening Details Patient Name: Date of Service: [name] Sabal LLY H. 03/26/2022 8:00 A M Medical Record Number: [id number] Patient Account Number: [id number] Date of Birth/Sex: Treating RN: 18-May-1941 (81 y.o. [name] Primary Care Ameliya Nicotra: [name] Other Clinician: Referring Blossom Crume: Treating Yenifer Saccente/[name]: Mardi Mainland in Treatment: 0 Height (in): 65 Weight (lbs): 145 Body Mass Index (BMI): 24.1 Nutrition Risk Screening Items Score Screening NUTRITION RISK SCREEN: I have an illness or condition that made me change the kind and/or amount of food I eat 0 No I eat fewer than two meals per day 0 No I eat few fruits and vegetables, or milk products 0 No I have three or more drinks of beer, liquor or wine almost every day 0 No I have tooth or mouth problems that make it hard for me to eat 0 No I don't always have enough money to buy the food I need 0 No I eat alone most of the time 0 No I take three or more different prescribed or over-the-counter drugs a day 0 No Without wanting to, I have lost or gained 10 pounds in the last six months 0 No I am not always physically able to shop, cook and/or feed myself 0 No Nutrition Protocols Good Risk Protocol 0 No interventions needed Moderate Risk Protocol High Risk Proctocol Risk Level: Good Risk Score: 0 Electronic Signature(s) Signed: 03/26/2022 9:11:25 AM By: Dellie Catholic RN Entered By: Dellie Catholic on 03/26/2022 08:04:16

## 2022-03-26 NOTE — Progress Notes (Addendum)
BRIASIA, FLINDERS ([id number]) Visit Report for 03/26/2022 Allergy List Details Patient Name: Date of Service: [name] 03/26/2022 8:00 A M Medical Record Number: [id number] Patient Account Number: [id number] Date of Birth/Sex: Treating RN: 1941-02-28 (81 y.o. [name] Primary Care Shawanna Zanders: [name] Other Clinician: Referring Deanglo Hissong: Treating Siana Panameno/[name]: Mardi Mainland in Treatment: 0 Allergies Active Allergies Demerol scopolamine Bactrim Reaction: increased K+ Versed Reaction: agitation tramadol Reaction: kept awake Allergy Notes Electronic Signature(s) Signed: 03/26/2022 9:11:25 AM By: Dellie Catholic RN Entered By: Dellie Catholic on 03/26/2022 08:00:29 -------------------------------------------------------------------------------- Arrival Information Details Patient Name: Date of Service: [name], [name]. 03/26/2022 8:00 A M Medical Record Number: [id number] Patient Account Number: [id number] Date of Birth/Sex: Treating RN: 1941/09/14 (81 y.o. [name] Primary Care Rosbel Buckner: [name] Other Clinician: Referring Lenette Rau: Treating Rahmon Heigl/[name]: Mardi Mainland in Treatment: 0 Visit Information Patient Arrived: Ambulatory Arrival Time: 07:54 Accompanied By: self Transfer Assistance: None Patient Identification Verified: Yes Patient Has Alerts: Yes Patient Alerts: ABI R 0.68 ABI L 0.75 Electronic Signature(s) Signed: 03/26/2022 6:16:24 PM By: Dellie Catholic RN Previous Signature: 03/26/2022 9:11:25 AM Version By: Dellie Catholic RN Entered By: Minus Liberty History Since Last Visit Added or deleted any medications: No Any new allergies or adverse reactions: No Had a fall or experienced change in activities of daily living that may affect risk of falls: No Signs or symptoms of abuse/neglect since last visito No Hospitalized since last visit: No Implantable device  outside of the clinic excluding cellular tissue based products placed in the center since last visit: No Has Dressing in Place as Prescribed: Yes Pain Present Now: No -------------------------------------------------------------------------------- Clinic Level of Care Assessment Details Patient Name: Date of Service: [name] 03/26/2022 8:00 A M Medical Record Number: [id number] Patient Account Number: [id number] Date of Birth/Sex: Treating RN: 1940-10-15 (81 y.o. [name] Primary Care Cade Olberding: [name] Other Clinician: Referring Tyneka Scafidi: Treating Sirron Francesconi/[name]: Mardi Mainland in Treatment: 0 Clinic Level of Care Assessment Items TOOL 1 Quantity Score X- 1 0 Use when EandM and Procedure is performed on INITIAL visit ASSESSMENTS - Nursing Assessment / Reassessment X- 1 20 General Physical Exam (combine w/ comprehensive assessment (listed just below) when performed on new pt. evals) X- 1 25 Comprehensive Assessment (HX, ROS, Risk Assessments, Wounds Hx, etc.) ASSESSMENTS - Wound and Skin Assessment / Reassessment '[]'$  - 0 Dermatologic / Skin Assessment (not related to wound area) ASSESSMENTS - Ostomy and/or Continence Assessment and Care '[]'$  - 0 Incontinence Assessment and Management '[]'$  - 0 Ostomy Care Assessment and Management (repouching, etc.) PROCESS - Coordination of Care X - Simple Patient / Family Education for ongoing care 1 15 '[]'$  - 0 Complex (extensive) Patient / Family Education for ongoing care X- 1 10 Staff obtains Programmer, systems, Records, T Results / Process Orders est X- 1 10 Staff telephones HHA, Nursing Homes / Clarify orders / etc '[]'$  - 0 Routine Transfer to another Facility (non-emergent condition) '[]'$  - 0 Routine Hospital Admission (non-emergent condition) X- 1 15 New Admissions / Biomedical engineer / Ordering NPWT Apligraf, etc. , '[]'$  - 0 Emergency Hospital Admission (emergent condition) PROCESS  - Special Needs '[]'$  - 0 Pediatric / Minor Patient Management '[]'$  - 0 Isolation Patient Management '[]'$  - 0 Hearing / Language / Visual special needs '[]'$  - 0 Assessment of Community assistance (transportation, D/C planning, etc.) '[]'$  - 0 Additional assistance / Altered mentation '[]'$  - 0 Support Surface(s) Assessment (  bed, cushion, seat, etc.) INTERVENTIONS - Miscellaneous '[]'$  - 0 External ear exam '[]'$  - 0 Patient Transfer (multiple staff / Civil Service fast streamer / Similar devices) '[]'$  - 0 Simple Staple / Suture removal (25 or less) '[]'$  - 0 Complex Staple / Suture removal (26 or more) '[]'$  - 0 Hypo/Hyperglycemic Management (do not check if billed separately) '[]'$  - 0 Ankle / Brachial Index (ABI) - do not check if billed separately Has the patient been seen at the hospital within the last three years: Yes Total Score: 95 Level Of Care: New/Established - Level 3 Electronic Signature(s) Signed: 03/26/2022 9:11:25 AM By: Dellie Catholic RN Entered By: Dellie Catholic on 03/26/2022 09:08:36 -------------------------------------------------------------------------------- Encounter Discharge Information Details Patient Name: Date of Service: [name], [name] LLY H. 03/26/2022 8:00 A M Medical Record Number: [id number] Patient Account Number: [id number] Date of Birth/Sex: Treating RN: May 25, 1941 (81 y.o. [name] Primary Care Esvin Hnat: [name] Other Clinician: Referring Jasper Hanf: Treating Ledonna Dormer/[name]: Mardi Mainland in Treatment: 0 Encounter Discharge Information Items Post Procedure Vitals Discharge Condition: Stable Temperature (F): 97.5 Ambulatory Status: Ambulatory Pulse (bpm): 62 Discharge Destination: Home Respiratory Rate (breaths/min): 16 Transportation: Private Auto Blood Pressure (mmHg): 148/75 Accompanied By: self Schedule Follow-up Appointment: Yes Clinical Summary of Care: Patient Declined Electronic Signature(s) Signed: 03/26/2022 9:11:25 AM  By: Dellie Catholic RN Entered By: Dellie Catholic on 03/26/2022 09:09:53 -------------------------------------------------------------------------------- Lower Extremity Assessment Details Patient Name: Date of Service: [name] 03/26/2022 8:00 A M Medical Record Number: [id number] Patient Account Number: [id number] Date of Birth/Sex: Treating RN: Jun 26, 1941 (81 y.o. [name] Primary Care Takeyla Million: [name] Other Clinician: Referring Hayato Guaman: Treating Daniesha Driver/[name]: Mardi Mainland in Treatment: 0 Edema Assessment Assessed: Shirlyn Goltz: No] [Right: No] [Left: Edema] [Right: :] Calf Left: Right: Point of Measurement: 30 cm From Medial Instep 30.7 cm Ankle Left: Right: Point of Measurement: 9 cm From Medial Instep 19 cm Knee To Floor Left: Right: From Medial Instep 40 cm Electronic Signature(s) Signed: 03/26/2022 9:11:25 AM By: Dellie Catholic RN Entered By: Dellie Catholic on 03/26/2022 08:09:19 -------------------------------------------------------------------------------- Multi Wound Chart Details Patient Name: Date of Service: [name], [name] LLY H. 03/26/2022 8:00 A M Medical Record Number: [id number] Patient Account Number: [id number] Date of Birth/Sex: Treating RN: February 22, 1941 (81 y.o. F) Primary Care Eulala Newcombe: [name] Other Clinician: Referring Tanashia Ciesla: Treating Maryela Tapper/[name]: Mardi Mainland in Treatment: 0 Vital Signs Height(in): 65 Pulse(bpm): 74 Weight(lbs): 145 Blood Pressure(mmHg): 148/75 Body Mass Index(BMI): 24.1 Temperature(F): 97.5 Respiratory Rate(breaths/min): 16 Photos: [N/A:N/A] Right Amputation Site - N/A N/A Wound Location: Transmetatarsal Gradually Appeared N/A N/A Wounding Event: Diabetic Wound/Ulcer of the Lower N/A N/A Primary Etiology: Extremity Coronary Artery Disease, Deep Vein N/A N/A Comorbid History: Thrombosis, Hypertension,  Myocardial Infarction, Peripheral Arterial Disease, Type II Diabetes 03/16/2022 N/A N/A Date Acquired: 0 N/A N/A Weeks of Treatment: Open N/A N/A Wound Status: No N/A N/A Wound Recurrence: 0.3x0.2x0.1 N/A N/A Measurements L x W x D (cm) 0.047 N/A N/A A (cm) : rea 0.005 N/A N/A Volume (cm) : 0.00% N/A N/A % Reduction in A rea: 0.00% N/A N/A % Reduction in Volume: Grade 2 N/A N/A Classification: Medium N/A N/A Exudate A mount: Serosanguineous N/A N/A Exudate Type: red, brown N/A N/A Exudate Color: None Present (0%) N/A N/A Granulation A mount: Large (67-100%) N/A N/A Necrotic A mount: Eschar N/A N/A Necrotic Tissue: Fat Layer (Subcutaneous Tissue): Yes N/A N/A Exposed Structures: Fascia: No Tendon: No Muscle: No Joint: No Bone: No None  N/A N/A Epithelialization: Debridement - Excisional N/A N/A Debridement: Pre-procedure Verification/Time Out 08:24 N/A N/A Taken: Lidocaine 4% Topical Solution N/A N/A Pain Control: Callus, Subcutaneous, Slough N/A N/A Tissue Debrided: Skin/Subcutaneous Tissue N/A N/A Level: 0.06 N/A N/A Debridement A (sq cm): rea Curette N/A N/A Instrument: Minimum N/A N/A Bleeding: Pressure N/A N/A Hemostasis Achieved: 0 N/A N/A Procedural Pain: 0 N/A N/A Post Procedural Pain: Procedure was tolerated well N/A N/A Debridement Treatment Response: 0.3x0.2x0.1 N/A N/A Post Debridement Measurements L x W x D (cm) 0.005 N/A N/A Post Debridement Volume: (cm) Debridement N/A N/A Procedures Performed: Treatment Notes Electronic Signature(s) Signed: 03/26/2022 8:36:15 AM By: [name] MD FACS Entered By: [name] on 03/26/2022 08:36:15 -------------------------------------------------------------------------------- Multi-Disciplinary Care Plan Details Patient Name: Date of Service: [name], [name] LLY H. 03/26/2022 8:00 A M Medical Record Number: [id number] Patient Account Number: [id number] Date of  Birth/Sex: Treating RN: [birth year] (81 y.o. [name] Primary Care Aqib Lough: [name] Other Clinician: Referring Kelliann Pendergraph: Treating Johntavious Francom/[name]: Mardi Mainland in Treatment: 0 Active Inactive Wound/Skin Impairment Nursing Diagnoses: Impaired tissue integrity Goals: Patient/caregiver will verbalize understanding of skin care regimen Date Initiated: 03/26/2022 Target Resolution Date: 05/23/2022 Goal Status: Active Interventions: Assess ulceration(s) every visit Treatment Activities: Skin care regimen initiated : 03/26/2022 Notes: Electronic Signature(s) Signed: 03/26/2022 9:11:25 AM By: Dellie Catholic RN Entered By: Dellie Catholic on 03/26/2022 09:07:14 -------------------------------------------------------------------------------- Pain Assessment Details Patient Name: Date of Service: [name], [name] 03/26/2022 8:00 A M Medical Record Number: [id number] Patient Account Number: [id number] Date of Birth/Sex: Treating RN: [birth year] (81 y.o. [name] Primary Care Adonis Yim: [name] Other Clinician: Referring Kierstan Auer: Treating Ileah Falkenstein/[name]: Mardi Mainland in Treatment: 0 Active Problems Location of Pain Severity and Description of Pain Patient Has Paino No Site Locations Pain Management and Medication Current Pain Management: Electronic Signature(s) Signed: 03/26/2022 9:11:25 AM By: Dellie Catholic RN Entered By: Dellie Catholic on 03/26/2022 08:18:31 -------------------------------------------------------------------------------- Patient/Caregiver Education Details Patient Name: Date of Service: [name], [name] LLY H. 7/3/2023andnbsp8:00 A M Medical Record Number: [id number] Patient Account Number: [id number] Date of Birth/Gender: Treating RN: 1941/06/14 (81 y.o. [name] Primary Care Physician: [name] Other Clinician: Referring  Physician: Treating Physician/[name]: Mardi Mainland in Treatment: 0 Education Assessment Education Provided To: Patient Education Topics Provided Wound/Skin Impairment: Methods: Explain/Verbal Responses: Return demonstration correctly Electronic Signature(s) Signed: 03/26/2022 9:11:25 AM By: Dellie Catholic RN Entered By: Dellie Catholic on 03/26/2022 09:07:27 -------------------------------------------------------------------------------- Wound Assessment Details Patient Name: Date of Service: [name], [name] [place]. 03/26/2022 8:00 A M Medical Record Number: [id number] Patient Account Number: [id number] Date of Birth/Sex: Treating RN: Jun 17, 1941 (81 y.o. 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 Wound Open Wounding Event: Gradually Appeared Status: Date Acquired: 03/16/2022 Comorbid Coronary Artery Disease, Deep Vein Thrombosis, Hypertension, Weeks Of Treatment: 0 History: Myocardial Infarction, Peripheral Arterial Disease, Type II Diabetes Clustered Wound: No Photos Wound Measurements Length: (cm) 0.3 Width: (cm) 0.2 Depth: (cm) 0.1 Area: (cm) 0.047 Volume: (cm) 0.005 % Reduction in Area: 0% % Reduction in Volume: 0% Epithelialization: None Tunneling: No Undermining: No Wound Description Classification: Grade 2 Exudate Amount: Medium Exudate Type: Serosanguineous Exudate Color: red, brown Foul Odor After Cleansing: No Slough/Fibrino Yes Wound Bed Granulation Amount: None Present (0%) Exposed Structure Necrotic  Amount: Large (67-100%) Fascia Exposed: No Necrotic Quality: Eschar Fat Layer (Subcutaneous Tissue) Exposed: Yes Tendon Exposed:  No Muscle Exposed: No Joint Exposed: No Bone Exposed: No Treatment Notes 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 Wound Cleanser Discharge Instruction: Cleanse the wound with wound cleanser prior to applying a clean dressing using gauze sponges, not tissue or cotton balls. Peri-Wound Care Topical Mupirocin Ointment Discharge Instruction: Apply Mupirocin (Bactroban) as instructed Primary Dressing KerraCel Ag Gelling Fiber Dressing, 2x2 in (silver alginate) Discharge Instruction: Apply silver alginate to wound bed as instructed Secondary Dressing ABD Pad, 5x9 Discharge Instruction: Apply over primary dressing as directed. Woven Gauze Sponges 2x2 in Discharge Instruction: Apply over primary dressing as directed. Secured With [employer], 4.5x3.1 (in/yd) Discharge Instruction: Secure with Kerlix as directed. Paper Tape, 2x10 (in/yd) Discharge Instruction: Secure dressing with tape as directed. Elastic Net Retention Dressing, Size 5, 12x25 (in/yd) Compression Wrap Compression Stockings Add-Ons Electronic Signature(s) Signed: 03/26/2022 9:11:25 AM By: Dellie Catholic RN Entered By: Dellie Catholic on 03/26/2022 08:18:15 -------------------------------------------------------------------------------- Vitals Details Patient Name: Date of Service: [name], [name] LLY H. 03/26/2022 8:00 A M Medical Record Number: [id number] Patient Account Number: [id number] Date of Birth/Sex: Treating RN: June 20, 1941 (81 y.o. [name] Primary Care Glady Ouderkirk: [name] Other Clinician: Referring Jearline Hirschhorn: Treating Deandrea Rion/[name]: Mardi Mainland in Treatment: 0 Vital Signs Time Taken: 07:55 Temperature (F): 97.5 Height (in): 65 Pulse (bpm): 62 Source: Stated Respiratory Rate  (breaths/min): 16 Weight (lbs): 145 Blood Pressure (mmHg): 148/75 Source: Stated Reference Range: 80 - 120 mg / dl Body Mass Index (BMI): 24.1 Electronic Signature(s) Signed: 03/26/2022 9:11:25 AM By: Dellie Catholic RN Entered By: Dellie Catholic on 03/26/2022 08:00:12

## 2022-03-26 NOTE — Progress Notes (Signed)
[name] ([id number]) Visit Report for 03/26/2022 Chief Complaint Document Details Patient Name: Date of Service: [name] 03/26/2022 8:00 A M Medical Record Number: [id number] Patient Account Number: [id number] Date of Birth/Sex: Treating RN: 06/11/1941 (81 y.o. F) Primary Care Provider: [name] Other Clinician: Referring Provider: Treating Provider/[name]: Mardi Mainland in Treatment: 0 Information Obtained from: Patient Chief Complaint 07/13/2019; patient is here for review wounds on the toes of her right foot and the left fourth toe 08/04/2020; patient is here for review of wounds at the anterior part of her right first met head/TMA site and another area on the right medial calf at a vein harvest site. 03/26/2022: Reopening of right transmetatarsal scar at first metatarsal head. Electronic Signature(s) Signed: 03/26/2022 8:36:54 AM By: [name] MD FACS Entered By: [name] on 03/26/2022 08:36:54 -------------------------------------------------------------------------------- Debridement Details Patient Name: Date of Service: [name], [name] 03/26/2022 8:00 A M Medical Record Number: [id number] Patient Account Number: [id number] Date of Birth/Sex: Treating RN: [birth year] (81 y.o. [name] Primary Care Provider: [name] Other Clinician: Referring Provider: Treating Provider/[name]: Mardi Mainland in Treatment: 0 Debridement Performed for Assessment: Wound #21 Right Amputation Site - Transmetatarsal Performed By: Physician [name], MD Debridement Type: Debridement Severity of Tissue Pre Debridement: Fat layer exposed Level of Consciousness (Pre-procedure): Awake and Alert Pre-procedure Verification/Time Out Yes - 08:24 Taken: Start Time: 08:24 Pain Control: Lidocaine 4% T opical Solution T Area Debrided (L x W): otal 0.3 (cm) x 0.2 (cm) = 0.06  (cm) Tissue and other material debrided: Non-Viable, Callus, Slough, Subcutaneous, Slough Level: Skin/Subcutaneous Tissue Debridement Description: Excisional Instrument: Curette Specimen: Tissue Culture Number of Specimens T aken: 1 Bleeding: Minimum Hemostasis Achieved: Pressure End Time: 08:26 Procedural Pain: 0 Post Procedural Pain: 0 Response to Treatment: Procedure was tolerated well Level of Consciousness (Post- Awake and Alert procedure): Post Debridement Measurements of Total Wound Length: (cm) 0.3 Width: (cm) 0.2 Depth: (cm) 0.1 Volume: (cm) 0.005 Character of Wound/Ulcer Post Debridement: Improved Severity of Tissue Post Debridement: Fat layer exposed Post Procedure Diagnosis Same as Pre-procedure Electronic Signature(s) Signed: 03/26/2022 9:11:25 AM By: Dellie Catholic RN Signed: 03/26/2022 11:38:54 AM By: [name] MD FACS Entered By: Dellie Catholic on 03/26/2022 08:34:53 -------------------------------------------------------------------------------- HPI Details Patient Name: Date of Service: [name], [name] 03/26/2022 8:00 A M Medical Record Number: [id number] Patient Account Number: [id number] Date of Birth/Sex: Treating RN: 08/29/1941 (81 y.o. F) Primary Care Provider: [name] Other Clinician: Referring Provider: Treating Provider/[name]: Mardi Mainland in Treatment: 0 History of Present Illness HPI Description: ADMISSION 07/13/2019 Patient is a 81 year old type II diabetic. She has known PAD. She has been followed by Dr. Jacqualyn Posey of podiatry for blistering areas on her toes dating back to 04/30/2019 which at this was the left fourth toe. By 8/11 she had wounds on the right and left second toes. She underwent arterial studies by Dr. Alvester Chou on 7/31 that showed ABIs on the right of 0.60 TBI of 0.26 on the left ABI of 0.56 and a TBI of 0.25. By 9/10 she had ischemic-looking wounds per Dr. Earleen Newport on the right first,  left first second and third. She has been using Medihoney and then mupirocin more recently simply Betadine. The patient underwent angiography by Dr. Gwenlyn Found on 9/21. On the right this showed 90 to 95% calcified distal right common femoral artery stenosis and a 95% focal mid SFA stenosis followed by an 80% segmental  stenosis. Noted that there was 1 vessel runoff in the foot via the peroneal. On the left there was an 80% left SFA, 70% mid left SFA. There was a short segment calcified CTO distal less than SFA and above-knee popliteal artery reconstituting with two-vessel runoff. 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 It was felt that she had bilateral SFA disease as well as tibial vessel disease. An attempt was made to revascularize the left SFA but they were unable to cross because of the highly calcified nature of the lesion. 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 She has an area on the medial left fourth toe with raised horned callus on top of this. I am not certain what this represents. With regards to pain she has about a 2-1/2 I will claudication tolerance in the grocery store. She has some pain in night which is relieved by putting her feet down over the side of the bed. She is wearing Nitro-Dur patches on the top of her right foot. Past medical history includes type 2 diabetes with secondary PAD, neoplasm of the skin, diabetic retinopathy, carotid artery stenosis, hyperlipidemia hypertension. 11/2; the last time the patient was here I spoke to Dr. Gwenlyn Found about revascularization options on the right. As I understand things currently Dr. Gwenlyn Found spoke with Dr. Trula Slade and ultimately the patient was taken to surgery on 10/27. She had a right iliofemoral endarterectomy with a bovine patch angioplasty. I think the plan now is for her to have a staged intervention on the right  SFA by Dr. Alvester Chou. Per the patient's understanding Dr. Gwenlyn Found and Dr. Jacqualyn Posey are waiting to see when the gangrenous toes on the right foot can be amputated. The patient states her pain is a lot better and she is grateful for that. 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 Small area on the left fourth toe. She is using Betadine to the mummified areas on the right and Medihoney on the left 08/27/2019; the last time I saw this patient I discharged her from the clinic. She had been revascularized by Dr. Trula Slade and she had a right iliofemoral endarterectomy with a bovine angioplasty. 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 Jacqualyn Posey of podiatry on 08/07/2019. I note that she also had a intervention by Dr. Gwenlyn Found and he performed a directional atherectomy and drug-coated balloon angioplasty of the SFA and popliteal artery on the right. I am not certain of the date of Dr. Kennon Holter procedure as of the time of this dictation. She was referred back to [place] by Dr. Earleen Newport predominantly for follow-up of the left fourth toe. She still has sutures and stitches in the right TMA site. She states her pain is a lot better. She expresses concern about the condition of the amputation site at the TMA. She is on doxycycline I think prescribed by Dr. Earleen Newport. She is complaining of some pain at night 12/10; I spoke to Dr. Jacqualyn Posey last week. He removed the sutures on the right foot on Monday of this week. She has the area on the left fourth toe just proximal to the PIP and then the right TMA site. She is still on doxycycline and has enough through next week. Unfortunately the TMA site does not look good at all. Both on the lateral and medial part  of the incisions are areas that probe to bone. There is purulence over the medial part which I have cultured. We will use silver alginate. Left fourth toe looks somewhat better but there was still exposed  bone 12/17; patient has an MRI booked for 12/30. Culture I did last week showed Pseudomonas Serratia and Enterococcus. This was purulent drainage coming out of the medial part of her amputation site. I use cefdinir 300 twice daily for 10 days that started on 12/15. Her x-ray on the right showed limited evaluation for osteomyelitis. The findings could have been postoperative. There was subtle erosion in the distal first and distal fifth metatarsal. An MRI was suggested. On the left she had irregularity of the fourth middle and proximal phalanx consistent with a history of osteomyelitis. I do not know that she has a history of osteomyelitis in this area. She had a newly defined area on the plantar third toe 12/31; patient's MRI is listed below: MRI OF THE RIGHT FOREFOOT WITHOUT AND WITH CONTRAST TECHNIQUE: Multiplanar, multisequence MR imaging of the right forefoot was performed before and after the administration of intravenous contrast. CONTRAST 6 mL GADAVIST IV SOLN : COMPARISON: Plain films right foot 09/04/2019. FINDINGS: Bones/Joint/Cartilage The patient is status post transmetatarsal amputation as seen on the prior exam. Marrow edema and enhancement are seen in all of the distal metatarsals. In the first metatarsal, signal change extends 1.5 cm proximal to the stump and in the second metatarsal extends approximately 2 cm proximal to the stump. Edema and enhancement are seen in only the distal 0.7 cm of the third metatarsal stump and tips of the fourth and fifth metatarsals. Bone marrow signal is otherwise unremarkable. A small focus of subchondral edema is seen in the lateral talus, likely degenerative. Ligaments Intact. Muscles and Tendons No intramuscular fluid collection. Soft tissues Skin ulceration is seen off the stump of the first metatarsal. A thin fluid collection tracks deep to the wound and over the anterior metatarsals worrisome for small abscess. Intense  subcutaneous edema and enhancement are seen diffusely. 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 Cellulitis about the foot. Skin ulceration over the distal first metatarsal is identified with a thin fluid collection tracking anteriorly along the stump worrisome for abscess. Small focus of subchondral edema in the lateral talus is likely degenerative. Electronically Signed By: Inge Rise M.D. On: 09/23/2019 15:25 Patient arrives in clinic today not complaining any of any pain. 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 She does not describe claudication however her activity is very limited. 10/01/2019; since the patient was last here I have communicated with Dr. Gwenlyn Found after bypass by Dr. Trula Slade and addressing the right superficial femoral artery he states that she is widely patent through peroneal arteries to the ankle with collaterals to the dorsalis pedis. He states he is going to talk to colleagues about the feasibility of tibial pedal access. The patient seems infectious disease later this afternoon Dr. Baxter Flattery. in preparation for this she has been off antibiotics for 1 week and I went ahead and obtain pieces of the remanence of her first metatarsal for pathology and CandS. The patient is a candidate for hyperbaric oxygen. 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 1/15; considerable improvement in the wounds on her feet. We are using silver  collagen. She follows with Dr. Baxter Flattery of infectious disease and is on meropenem and daptomycin. She has been taught how to do this herself at home. Follows with Dr. Graylon Good at the end of treatment here. She has 2 wounds on the surgical TMA site 1 lateral and 1 medial the lateral 1 has  regressed tremendously. The area medially still has some exposed bone although the base of this looks healthy. 1/22; 2 separate wounds on the surgical TMA site. Both of these look satisfactory. The medial area does not have any exposed bone. This is an improvement On the left side fourth toe dorsally over the proximal phalanx there is a deep punched out area that probes to bone. She has an area on the tip of the left third toe. She also tells [place] that in HBO she traumatized her left Achilles and this is left her with a superficial wound area 1/28; weekly visit along with HBO. She has 5 wounds. T our punched out areas on the original TMA site on the right. Both of these appear to have contracted. o The area on the right no longer has exposed bone. She has an area on the tip of the left third toe and on the DIP of the left fourth toe. Both of these had surface callus that I removed and unfortunately they have small areas that both go to bone. She has a traumatic wound on the left Achilles area that happened in HBO and that appears better. She is tolerating her IV antibiotics well at home. She has home health changing the dressings and she is doing it once on the weekends. We have been using silver collagen. She has been extensively revascularized on the right by Dr. Trula Slade and subsequently by Dr. Gwenlyn Found. 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 Baxter Flattery of infectious disease on 2/17. Dr. Gwenlyn Found on 2/23 and according to her her antibiotics stopped on 2/23. She is tolerating hyperbarics well. She has made a tremendous improvement in the right forefoot with only 2 smaller open wounds. Using silver collagen. On the left foot she has the area on the tip of the left third toe and the medial part of the left fourth toe. These had exposed bone last week I did not sense any of that  today 2/12; sees Dr. Graylon Good on 2/17 and Dr. Gwenlyn Found on 2/23. We are going to use Dermagraft on this today however the lateral part of her train TMA incision on the right is healed and the medial part is down so much that we just continue with the silver collagen She has wounds on the tip of her left third and the medial aspect of the left fourth. Both of these still have exposed bone I have not been able to get these to epithelialize. 2/19; she sees Dr. Gwenlyn Found on 2/23 and I have communicated with him about the left vascular supply. Looking at her angiogram from 08/03/2019 it looks as though that they could not cross the left CFA. Noted that she had one-vessel runoff bilaterally. She also apparently saw Dr. Graylon Good although I did not look up these results for preparation of this record. 2/26; Dr. Gwenlyn Found is going to do an angiogram next week on Wednesday. I think they are going to try to go at this both retrograde and anterograde to see if anything can be  done to revascularize the left lower limb. She continues to make nice progress on the remaining wound on the right medial foot on her TMA site and the toe it wounds on the left are responding nicely as well 3/12; the patient underwent an extensive and complicated revascularization on the left leg by Dr. Fletcher Anon on 3/3; she had an atherectomy on occluded left popliteal artery; anterior tibial artery followed by a balloon angioplasty. Atherectomy was also performed to the left SFA because there was still significant 50% stenosis in the left popliteal artery they performed an intravascular lithotripsy which improved the residual stenosis to 20%. The same lithotripsy was used to dilate the proximal left SFA. She had a drug-eluting stent placed I believe in the SFA. The patient returned for hyperbarics this week. She has had some eye problems on the right which she tells me are secondary to diabetic retinopathy and she saw her eye doctor. She is really made  excellent progress there is no open wounds on the left foot at all. She has one open area medially and her TMA site on the right lateral wound is closed. 3/19; the patient comes in today with the area on the medial transmet on the right improved. Some debris removed from the surface revealed the still open wound. Unfortunately she now has the open area on the third toe tip and the medial aspect of the dorsal fourth toe. These are in the same location as her previous wounds. These had actually closed up last week. 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 She is undergone complex revascularizations on the right by Dr. Trula Slade and Dr. Gwenlyn Found and I believe Dr. 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 She completed IVantibiotics as directed by infectious disease and I believe a course of oral antibiotics as well.she underwent a complete 40 treatments of hyperbarics. She comes in today with the vast majority of the right TMA site healed there is a small opening on the most medial aspect. Her left third and left fourth toes appear to have epithelialization although this has fluctuated somewhat. 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 She had a gaping wound on the right transmetatarsal amputation L as well as deep wounds on the left third and left fourth toes. She underwent complex revascularizations on her bilateral lower legs which I described on her last visit. She completed IV antibiotics by infectious disease and 40 treatments of hyperbarics. Her left third and fourth toes are healed. The area on the right transmetatarsal amputation site is also closed today. READMISSION 08/04/2020 [name] is  now a 81 year old woman that we had for a complex stay in our clinic from October 2000 14 January 2020. 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 Her current problems with wounds began on October 30 when she developed a spontaneous opening over the right transmet medial aspect over the first metatarsal head. She has been using collagen on this that she had leftover from her last stay in this clinic. She also has had an open area on  the medial right calf from a vein harvest site from her CABG she. She said that this is never really closed since the vein harvest was done. With regards to her arterial status this is a major issue. She had an angiogram by Dr. Gwenlyn Found in September 2020. She developed a gangrenous right first and second toes. Ultimately she was referred to Dr. Trula Slade who did a right common femoral endarterectomy with patch angioplasty. This was on 07/21/2019 and she really had a good result. Dr. Gwenlyn Found did a atherectomy followed by a drug coated balloon angioplasty of her right SFA popliteal and tibial peroneal trunk on 08/03/2019 again with excellent excellent results. Follow-up.Dopplers in November revealed revealed a widely patent SFA and tibioperoneal trunk. UNFORTUNATELY her recent angiogram that was done on 08/01/2020 showed an 80% right common femoral artery stenosis just above the previous endarterectomy site occluded right right SFA at the origin reconstituting the adductor canal by the profunda femoris collaterals. The profunda femoris is also diffusely diseased. There is and again another 80% tibioperoneal trunk stenosis and one-vessel runoff via the peroneal artery. She is seen Dr. Donzetta Matters and she is scheduled for a femoral endarterectomy and right femoral-popliteal bypass grafting with endarterectomy of her tibial peroneal trunk. The scheduled  surgery is on 11/23 The patient does not complain of pain at rest. She does have 5-minute walking claudication. The wounds on her right first met head remanent was spontaneous she does not think she hit this. As mentioned in the area on her right medial calf is a vein harvest site Dr. Gwenlyn Found had texted me about getting her in the clinic and we have arranged this as soon as we can. I have told her that we will have to see how successful Dr. Donzetta Matters is before we know what can be done about the wounds in a precise fashion. Fortunately there does not seem to be any obvious infection involving the right foot although that may need to be looked at if things really deteriorate. She was treated with IV antibiotics and hyperbaric oxygen for her previous osteomyelitis 12/2; the patient underwent a right femoral endarterectomy and bypass femoral to popliteal artery. This was done on 08/16/2020. She is done remarkably well. She has a small area on the right anterior tibia and a small area in the medial part of her original right transmetatarsal amputation. She appears to have had an excellent response to surgery. 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 Kocak is a now 81 year old woman who arrives for review of wounds on the left TMA site extending into her plantar foot as well as an area on the left posterior calcaneus. 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 She was extensively revascularized on the right at that time as noted above in our previous notes ended up receiving IV antibiotics and hyperbaric oxygen therapy. Miraculously this TMA site actually closed and she really has had not any trouble with this since. UNFORTUNATELY the same cannot be said of her left side. Her problem  apparently started sometime in July when she hit her left third toe on a barstool while playing pool. She developed an acute infection which eventually led to underlying osteomyelitis. She underwent an extensive hospitalization from 04/27/2021 through 05/15/2021. She received IV antibiotics. Also extensive intervention by Dr.  Hawken of vein and vascular which included a left common femoral to tibioperoneal trunk bypass as well as a left third toe amputation on 05/03/2021. Unfortunately the area did not heal she ended up requiring a left TMA. By her description this was left open. She has been using a wound VAC on this to earlier this week and then was switched to Santyl. I think because of the left forefoot off loader she is also developed an area on the left posterior calcaneus. She is not complaining of a lot of pain. She has her daughter at home to change the dressings. She has been prescribed Santyl and she has a tube that she is brought into our clinic. I cannot get a lot of history of claudication although she is really not that mobile at this point walking with a walker. 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 She has had multiple interventions on the right side by vein and vascular and also by Dr. Gwenlyn Found as well as a transmetatarsal amputation on the right. She has had follow-up noninvasive studies on 06/06/2021; on the left that showed an ABI of 0.47 with monophasic waveforms a dorsalis pedis ABI of 0.40 with monophasic waveforms. Her ANGIOGRAM which was initially done by Dr. Oneida Alar on 04/28/2021 showed severe left foot superficial femoral artery occlusive disease and an occlusion of the left popliteal artery with two-vessel runoff to the left foot with moderate to severe disease below the knee popliteal artery and tibial disease. She had 80% distal right  external iliac artery above the existing femoral above-knee popliteal bypass but she does not have an open wound on the right foot She arrives in clinic with an extensive open area on the TMA site which in the mid aspect extends into the plantar foot. She also has a more superficial area on the upper part of her Achilles area of the left heel 9/29; patient arrives in clinic today with slough over the TMA site. We use MolecuLight to look at the surface of this suggesting cyan and white discoloration over a large part of this wound also extending into the third metatarsal area. She also has the area on the left posterior calcaneus. This is slough covered we will switch to [facility] here as well. There is some warmth in the area and some erythema we will give her antibiotics as well 10/6; completely necrotic surface once again on the left TMA site. We have been using Santyl and Hydrofera Blue. She has an area on the left posterior Achilles and actually states this has been more painful this week indeed there is some erythema around this area. She also describes I think claudication at rest which is relieved by dipping her left leg over the side of the bed at night. 10/13; our intake nurse was able to brush some slough off the surface of the left TMA hence this did not require debridement that is better than last week. The area heading towards the plantar part of her foot required an aggressive debridement to clean this up. This is quite a sizable divot but does not go to bone. The area on the right Achilles heel is also covered in a high 100% nonviable tissue we have been using Santyl and silver alginate 10/20; the area on the left TMA looks much better. Even the area is spreading into the midfoot looks improved. The  right Achilles still is covered and nonviable surface 100%. We have been using Santyl and silver alginate and this will continue for this week The patient is approved for 5 Apligraf's and I  think she is good enough to start using these next week 10/27; the patient's wounds actually look quite good. We have been using Santyl and silver alginate. We applied Apligraf #1 today Saw Dr. Stanford Breed her vascular surgeon on 10/25 he told her TMA was healing nicely. She will follow-up with them in December to repeat noninvasive testing. 08/16/2021 upon evaluation today patient appears to be doing much better in regard to her wounds. Both are showing signs of improvement which is great news. I am going to perform some debridement to clear away some of the necrotic debris currently this will just be a light debridement. Nonetheless other than that I feel like that the Apligraf is doing quite well working and reapply today and this will be #3 as far as the application is concerned. 12/20; since the patient was last here she was seen by Dr. Stanford Breed who felt that her Doppler flow was sluggish in the bypass. She therefore went underwent a urgent angiogram on 09/07/2021 by Dr. Carlis Abbott. She had an angioplasty of the distal anastomosis and the anterior tibial artery. She tolerated this well. She had no flow-limiting stenosis in the aortoiliac segment on the left. Some calcification in the left common iliac artery but it was not flow-limiting the common femoral and profunda were patent. The left lower extremity bypass had brisk flow with no significant proximal anastomosis on the common femoral artery. Distally there was a high-grade stenosis where the bypass anastomosis was performed. This was angioplastied as well as the anterior tibial artery. 12/28; the patient comes in early because of a wound over the tip of her right first metatarsal head. She is not really sure how this happened. Says she noticed it when she took her sock off on Friday before she got in the shower. She has been using Hydrofera Blue to this area. This is the original wounded site we dealt with in the setting of a previous right TMA. The  patient has PA I researched this on  link. As it turns out she has had recent arterial studies noninvasive on 09/05/2021. On the right she had dampened monophasic waveforms at the popliteal absent waveforms at the PTA and dorsalis pedis. An ABI could not be obtained previously at 0.47. The comment states that they were unable to detect Doppler waveforms of the posterior tibial and dorsalis pedis arteries. 2023 09/26/2021; her original left TMA wound is almost closed. I reapplied Apligraf to the remaining area. The area on the plantar foot is also closed as far as I can tell HOWEVER she comes in with a second wound on the right first metatarsal head remanent both of these probe to bone. I did a bone scraping of this last week which only showed Corynebacterium unlikely to be a true pathogen. Nevertheless I am going to put her on Augmentin today probably for 2 weeks. X-ray will be done today. She has an appointment with Dr. Gwenlyn Saran at vein and vascular next Tuesday I will send him a note about my concerns about the right TM 1/10; we inadvertently remove the Apligraf from the left surgical site however everything looks closed here. She still has 2 probing wounds over the remanent of her first metatarsal head both go to bone although there is less erythema and swelling in the area.  She does not have an appointment with Dr. Donzetta Matters until next Tuesday. I am going to go ahead and order an MRI of the right foot I had originally planned to have or at least consider HBO if the patient has underlying osteomyelitis in the right foot however she tells me that she lost a significant amount of her visual field during her last run with HBO in the right eye nasal aspect. She saw Dr. Zadie Rhine who is a retinal specialist I will see if I can look at his records. She is still on Augmentin as finishes on Monday. I will see if she has had the MRI before considering additional antibiotics on Tuesday 10/10/2021; the patient's  MRI is suggested osteomyelitis involving the first metatarsal stump from her transmit there was no abscess. She has been on Augmentin with some improvement in the surrounding erythema although the small wound on the tip and the plantar aspect still probe easily to bone and there does not appear to be any viable tissue. UNFORTUNATELY arrives in clinic today with new areas on the right lateral foot neither 1 of these look particularly healthy. No debridement. The patient has an appoint with Dr. Donzetta Matters this morning my question is angiography of the right lego. I also wonder about an infectious disease appointment and I will continue her Augmentin but refer her for consideration. I did not get a culture that was positive even from a bone scraping however. 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 She saw her retina doctor Dr. Deloria Lair and her interpretation of this was that the hyperbarics may have contributed to a blockage. I wonder whether this was a central retinal vein or artery occlusion. I am not sure that Dr. Zadie Rhine documents in epic I will need to see if I can find his note 1/25; this is Hillary returns to clinic having a very eventful week. Firstly she underwent angiography by Dr. Stanford Breed of vein and vascular.. Her preintervention ABI was 0. She underwent a right iliofemoral drug-coated balloon angioplasty, right popliteal drug-coated balloon angioplasty and a right tibial peroneal trunk and peroneal angioplasty by Dr. Stanford Breed. She tolerated this well. She definitely has better blood flow to the right foot. She saw Dr. Doren Custard in follow-up who felt her Doppler flow in the right foot was good. An x-ray did not show any evidence of osteomyelitis however I noted a previous MRI certainly  did. She also saw infectious diseases been started on IV daptomycin and ertapenem. She had the PICC line placed. I had started her on Augmentin on February 3 and that had some improvement in the erythema and swelling over the remanent of her first metatarsal head. As far as I am aware she did not have a specific organism cultured at least not by me although I did try. Everything on the left is closed including the left transmit amputation site and the area on her left posterior heel/Achilles. We are using silver collagen on the right foot now which has 2 open wounds on the right first metatarsal head and the right fifth metatarsal head 2/1; the patient's left foot transmit site and the area on her heel remain closed. Some eschar but I do not think this requires debridement She has areas on the right first  and right fifth met heads underlying osteomyelitis she is on IV daptomycin and ertapenem. The areas are small but probe to bone. 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 Her right transmetatarsal amputation site appears less erythematous and less swollen however the wound on the first met head probes to bone. The bone itself does not feel that vibrant. The wounds on the fifth metatarsal head also on the right have eschar on the surface I did not consider debriding this today. 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 Dr. 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 We will therefore pursue hyperbaric oxygen therapy and treatment of the underlying refractory osteomyelitis 2/15; the patient's right foot actually looks a lot better 2 areas over the fifth met head are closed. 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 She is on IV antibiotics and has been approved for hyperbarics. In the meantime the whole right first met head remanent looks a lot better with antibiotics less swelling and less erythema but still with the open area probing to bone 2/22; saw Dr. Luan Pulling and then intravascular yesterday. She apparently had her arterial studies done. I have not reviewed these today however he stated he was well satisfied. She is still on IV antibiotics and tolerating hyperbaric oxygen well. Her largest wound is a small open area of the probes the bone over her right fifth metatarsal head remnant. She's been using silver collagen 11/22/2021: She continues on IV antibiotics but states that her last dose will be next Monday. She is tolerating hyperbaric oxygen therapy without difficulty. The wound over her first metatarsal head continues to contract. There is crusty nonviable tissue overlying this. This was debrided with a curette. 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 I removed this and was relieved that there is no open wound underneath. 12/06/2021: She is here today after her hyperbaric oxygen treatment. She continues to tolerate this well. The wound over her right first metatarsal head continues to, but still has a buildup of crusty nonviable tissue, similar to her prior visit. Her left TMA site had some dry skin across the scar area. Her home health nurse noticed a small amount of  drainage coming from the site. I removed the dry skin and identified is a pinpoint opening at the center of the amputation site with a clean base 12/20/2021: Today was her last hyperbaric oxygen treatment. She continues to have an opening over the right first metatarsal head that probes to bone. The left TMA opening that was identified at her last visit remains open. Both have good granulation tissue on the wound surfaces. 01/03/2022: At her last visit, we extended her hyperbaric oxygen therapy for another  20 treatments. The opening at the right first metatarsal head has closed. The left TMA opening has a tiny area that has not yet closed. We have been using Prisma and she has been wearing Darco forefoot offloading shoes. 01/17/2022: At her last visit, we closed out the right first metatarsal head site and the left TMA opening had a tiny area that was still not closed. Today, the TMA opening is closed and the right first metatarsal head has a pinpoint opening underneath some callus. She is due to complete her hyperbaric oxygen treatments this Friday. 01/31/2022: Her wounds are closed. READMISSION 03/26/2022 Lile Mccurley is well-known to our service. 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 Apparently she had a little bit of drainage starting to come at the site and when she removed her stocking, the wound opened. She denies any fevers or chills. No nausea or vomiting. She has been using some of the silver alginate that she had at home as a dressing. Electronic Signature(s) Signed: 03/26/2022 8:37:58 AM By: [name] MD FACS Entered By: [name] on 03/26/2022 08:37:58 -------------------------------------------------------------------------------- Physical Exam Details Patient Name: Date of Service: [name], [name] 03/26/2022 8:00 A M Medical Record Number: [id number] Patient Account Number: [id number] Date of Birth/Sex: Treating  RN: May 15, 1941 (81 y.o. F) Primary Care Provider: [name] Other Clinician: Referring Provider: Treating Provider/[name]: Mardi Mainland in Treatment: 0 Constitutional Slightly hypertensive. . . . No acute distress. Respiratory Normal work of breathing on room air.. Notes 03/26/2022: On the right transmetatarsal amputation site, where the great toe would have been, there is some very hard callus and an opening in the skin covered with eschar. Once this was removed, there was purulent drainage identified. No odor. Electronic Signature(s) Signed: 03/26/2022 8:38:51 AM By: [name] MD FACS Entered By: [name] on 03/26/2022 08:38:51 -------------------------------------------------------------------------------- Physician Orders Details Patient Name: Date of Service: [name], [place]. 03/26/2022 8:00 A M Medical Record Number: [id number] Patient Account Number: [id number] Date of Birth/Sex: Treating RN: 1941/03/30 (81 y.o. [name] Primary Care Provider: [name] Other Clinician: Referring Provider: Treating Provider/[name]: Mardi Mainland in Treatment: 0 Verbal / Phone Orders: No Diagnosis Coding ICD-10 Coding Code Description E11.621 Type 2 diabetes mellitus with foot ulcer L97.512 Non-pressure chronic ulcer of other part of right foot with fat layer exposed Follow-up Appointments ppointment in 1 week. - Dr Celine Ahr Room 3 Monday July 10th at 9:30am Return A Edema Control - Lymphedema / SCD / Other Elevate legs to the level of the heart or above for 30 minutes daily and/or when sitting, a frequency of: - throughout the day Avoid standing for long periods of time. Exercise regularly Moisturize legs daily. - Lotions :Aquaphor, Sween etc. Wound Treatment Wound #21 - Amputation Site - Transmetatarsal Wound Laterality: Right Cleanser: Soap and Water Every Other Day/30  Days Discharge Instructions: May shower and wash wound with dial antibacterial soap and water prior to dressing change. Cleanser: Wound Cleanser Every Other Day/30 Days Discharge Instructions: Cleanse the wound with wound cleanser prior to applying a clean dressing using gauze sponges, not tissue or cotton balls. Topical: Mupirocin Ointment Every Other Day/30 Days Discharge Instructions: Apply Mupirocin (Bactroban) as instructed Prim Dressing: KerraCel Ag Gelling Fiber Dressing, 2x2 in (silver alginate) Every Other Day/30 Days ary Discharge Instructions: Apply silver alginate to wound bed as instructed Secondary Dressing: ABD Pad, 5x9 Every Other Day/30 Days Discharge Instructions: Apply over primary  dressing as directed. Secondary Dressing: Woven Gauze Sponges 2x2 in Every Other Day/30 Days Discharge Instructions: Apply over primary dressing as directed. Secured With: [employer], 4.5x3.1 (in/yd) Every Other Day/30 Days Discharge Instructions: Secure with Kerlix as directed. Secured With: Paper Tape, 2x10 (in/yd) Every Other Day/30 Days Discharge Instructions: Secure dressing with tape as directed. Secured With: Administrator, sports Dressing, Size 5, 12x25 (in/yd) Every Other Day/30 Days Laboratory naerobe culture (MICRO) - Right Amputation Site Transmetatarsal - (ICD10 E11.621 - Type 2 Bacteria identified in Unspecified specimen by A diabetes mellitus with foot ulcer) LOINC Code: 258-5 Convenience Name: Anaerobic culture Electronic Signature(s) Signed: 03/26/2022 9:11:25 AM By: Dellie Catholic RN Signed: 03/26/2022 11:38:54 AM By: [name] MD FACS Previous Signature: 03/26/2022 8:39:00 AM Version By: [name] MD FACS Entered By: Dellie Catholic on 03/26/2022 09:00:49 Prescription 03/26/2022 -------------------------------------------------------------------------------- Carollee Massed MD Patient Name: Provider: [birth year] [id number] Date of  Birth: NPI#Wanda Plump [id number] Sex: DEA #: [phone] Phone #: License #: [place] Patient Address: [address] [place] [place], [place] [address] [address] [place], [place] [address] [phone] Allergies Demerol; scopolamine; Bactrim; Versed; tramadol Provider's Orders naerobe culture - ICD10: E11.621 - Right Amputation Site Transmetatarsal Bacteria identified in Unspecified specimen by A LOINC Code: 379-0 Convenience Name: Anaerobic culture Hand Signature: Date(s): Electronic Signature(s) Signed: 03/26/2022 9:11:25 AM By: Dellie Catholic RN Signed: 03/26/2022 11:38:54 AM By: [name] MD FACS Entered By: Dellie Catholic on 03/26/2022 09:00:50 -------------------------------------------------------------------------------- Problem List Details Patient Name: Date of Service: [name], [name] 03/26/2022 8:00 A M Medical Record Number: [id number] Patient Account Number: [id number] Date of Birth/Sex: Treating RN: 04-Nov-1940 (81 y.o. F) Primary Care Provider: [name] Other Clinician: Referring Provider: Treating Provider/[name]: Mardi Mainland in Treatment: 0 Active Problems ICD-10 Encounter Code Description Active Date MDM Diagnosis E11.621 Type 2 diabetes mellitus with foot ulcer 03/26/2022 No Yes L97.512 Non-pressure chronic ulcer of other part of right foot with fat layer exposed 03/26/2022 No Yes Inactive Problems Resolved Problems Electronic Signature(s) Signed: 03/26/2022 8:36:10 AM By: [name] MD FACS Previous Signature: 03/26/2022 8:23:32 AM Version By: [name] MD FACS Entered By: [name] on 03/26/2022 08:36:10 -------------------------------------------------------------------------------- Progress Note Details Patient Name: Date of Service: [name], [place]. 03/26/2022 8:00 A M Medical Record Number: [id number] Patient Account Number:  [id number] Date of Birth/Sex: Treating RN: [birth year] (81 y.o. F) Primary Care Provider: [name] Other Clinician: Referring Provider: Treating Provider/[name]: Mardi Mainland in Treatment: 0 Subjective Chief Complaint Information obtained from Patient 07/13/2019; patient is here for review wounds on the toes of her right foot and the left fourth toe 08/04/2020; patient is here for review of wounds at the anterior part of her right first met head/TMA site and another area on the right medial calf at a vein harvest site. 03/26/2022: Reopening of right transmetatarsal scar at first metatarsal head. History of Present Illness (HPI) ADMISSION 07/13/2019 Patient is a 81 year old type II diabetic. She has known PAD. She has been followed by Dr. Jacqualyn Posey of podiatry for blistering areas on her toes dating back to 04/30/2019 which at this was the left fourth toe. By 8/11 she had wounds on the right and left second toes. She underwent arterial studies by Dr. Alvester Chou on 7/31 that showed ABIs on the right of 0.60 TBI of 0.26 on the left ABI of 0.56 and a TBI of 0.25. By 9/10 she  had ischemic-looking wounds per Dr. Earleen Newport on the right first, left first second and third. She has been using Medihoney and then mupirocin more recently simply Betadine. The patient underwent angiography by Dr. Gwenlyn Found on 9/21. 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 Noted that there was 1 vessel runoff in the foot via the peroneal. ooOn the left there was an 80% left SFA, 70% mid left SFA. There was a short segment calcified CTO distal less than SFA and above-knee popliteal artery reconstituting with two-vessel runoff. 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 It was felt that she had bilateral SFA disease as well as tibial vessel disease. An attempt was made to revascularize  the left SFA but they were unable to cross because of the highly calcified nature of the lesion. 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 She has an area on the medial left fourth toe with raised horned callus on top of this. I am not certain what this represents. With regards to pain she has about a 2-1/2 I will claudication tolerance in the grocery store. She has some pain in night which is relieved by putting her feet down over the side of the bed. She is wearing Nitro-Dur patches on the top of her right foot. Past medical history includes type 2 diabetes with secondary PAD, neoplasm of the skin, diabetic retinopathy, carotid artery stenosis, hyperlipidemia hypertension. 11/2; the last time the patient was here I spoke to Dr. Gwenlyn Found about revascularization options on the right. As I understand things currently Dr. Gwenlyn Found spoke with Dr. Trula Slade and ultimately the patient was taken to surgery on 10/27. She had a right iliofemoral endarterectomy with a bovine patch angioplasty. I think the plan now is for her to have a staged intervention on the right SFA by Dr. Alvester Chou. Per the patient's understanding Dr. Gwenlyn Found and Dr. Jacqualyn Posey are waiting to see when the gangrenous toes on the right foot can be amputated. The patient states her pain is a lot better and she is grateful for that. 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 Small area on the left fourth toe. She is using Betadine to the mummified areas on the right and Medihoney on the left 08/27/2019; the last time I saw this patient I discharged her from the clinic. She had been revascularized by Dr. Trula Slade and she had a right iliofemoral endarterectomy with a bovine angioplasty. 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 Jacqualyn Posey of podiatry  on 08/07/2019. I note that she also had a intervention by Dr. Gwenlyn Found and he performed a directional atherectomy and drug-coated balloon angioplasty of the SFA and popliteal artery on the right. I am not certain of the date of Dr. Kennon Holter procedure as of the time of this dictation. She was referred back to [place] by Dr. Earleen Newport predominantly for follow-up of the left fourth toe. She still has sutures and stitches in the right TMA site. She states her pain is a lot better. She expresses concern about the condition of the amputation site at the TMA. She is on doxycycline I think prescribed by Dr. Earleen Newport. She is complaining of some pain at night 12/10; I spoke to Dr.  Wagoner last week. He removed the sutures on the right foot on Monday of this week. She has the area on the left fourth toe just proximal to the PIP and then the right TMA site. She is still on doxycycline and has enough through next week. Unfortunately the TMA site does not look good at all. Both on the lateral and medial part of the incisions are areas that probe to bone. There is purulence over the medial part which I have cultured. We will use silver alginate. Left fourth toe looks somewhat better but there was still exposed bone 12/17; patient has an MRI booked for 12/30. Culture I did last week showed Pseudomonas Serratia and Enterococcus. This was purulent drainage coming out of the medial part of her amputation site. I use cefdinir 300 twice daily for 10 days that started on 12/15. Her x-ray on the right showed limited evaluation for osteomyelitis. The findings could have been postoperative. There was subtle erosion in the distal first and distal fifth metatarsal. An MRI was suggested. On the left she had irregularity of the fourth middle and proximal phalanx consistent with a history of osteomyelitis. I do not know that she has a history of osteomyelitis in this area. She had a newly defined area on the plantar third toe 12/31; patient's  MRI is listed below: MRI OF THE RIGHT FOREFOOT WITHOUT AND WITH CONTRAST TECHNIQUE: Multiplanar, multisequence MR imaging of the right forefoot was performed before and after the administration of intravenous contrast. CONTRAST 6 mL GADAVIST IV SOLN : COMPARISON: Plain films right foot 09/04/2019. FINDINGS: Bones/Joint/Cartilage The patient is status post transmetatarsal amputation as seen on the prior exam. Marrow edema and enhancement are seen in all of the distal metatarsals. In the first metatarsal, signal change extends 1.5 cm proximal to the stump and in the second metatarsal extends approximately 2 cm proximal to the stump. Edema and enhancement are seen in only the distal 0.7 cm of the third metatarsal stump and tips of the fourth and fifth metatarsals. Bone marrow signal is otherwise unremarkable. A small focus of subchondral edema is seen in the lateral talus, likely degenerative. Ligaments Intact. Muscles and Tendons No intramuscular fluid collection. Soft tissues Skin ulceration is seen off the stump of the first metatarsal. A thin fluid collection tracks deep to the wound and over the anterior metatarsals worrisome for small abscess. Intense subcutaneous edema and enhancement are seen diffusely. 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 Cellulitis about the foot. Skin ulceration over the distal first metatarsal is identified with a thin fluid collection tracking anteriorly along the stump worrisome for abscess. Small focus of subchondral edema in the lateral talus is likely degenerative. Electronically Signed By: Inge Rise M.D. On: 09/23/2019 15:25 Patient arrives in clinic today not complaining any of any pain. We have been using silver alginate to the predominant areas in the surgical site on her  right transmetatarsal amputation. She does not describe claudication however her activity is very limited. 10/01/2019; since the patient was last here I have communicated with Dr. Gwenlyn Found after bypass by Dr. Trula Slade and addressing the right superficial femoral artery he states that she is widely patent through peroneal arteries to the ankle with collaterals to the dorsalis pedis. He states he is going to talk to colleagues about the feasibility of tibial pedal access. The patient seems infectious disease later this afternoon Dr. Baxter Flattery. in  preparation for this she has been off antibiotics for 1 week and I went ahead and obtain pieces of the remanence of her first metatarsal for pathology and CandS. The patient is a candidate for hyperbaric oxygen. 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 1/15; considerable improvement in the wounds on her feet. We are using silver collagen. She follows with Dr. Baxter Flattery of infectious disease and is on meropenem and daptomycin. She has been taught how to do this herself at home. Follows with Dr. Graylon Good at the end of treatment here. She has 2 wounds on the surgical TMA site 1 lateral and 1 medial the lateral 1 has regressed tremendously. The area medially still has some exposed bone although the base of this looks healthy. 1/22; 2 separate wounds on the surgical TMA site. Both of these look satisfactory. The medial area does not have any exposed bone. This is an improvement On the left side fourth toe dorsally over the proximal phalanx there is a deep punched out area that probes to bone. She has an area on the tip of the left third toe. She also tells [place] that in HBO she traumatized her left Achilles and this is left her with a superficial wound area 1/28; weekly visit along with HBO. She has 5 wounds. T our punched out areas on the original TMA site on the right. Both of these appear to have contracted. o The area on the right  no longer has exposed bone. She has an area on the tip of the left third toe and on the DIP of the left fourth toe. Both of these had surface callus that I removed and unfortunately they have small areas that both go to bone. She has a traumatic wound on the left Achilles area that happened in HBO and that appears better. She is tolerating her IV antibiotics well at home. She has home health changing the dressings and she is doing it once on the weekends. We have been using silver collagen. She has been extensively revascularized on the right by Dr. Trula Slade and subsequently by Dr. Gwenlyn Found. 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 Baxter Flattery of infectious disease on 2/17. Dr. Gwenlyn Found on 2/23 and according to her her antibiotics stopped on 2/23. She is tolerating hyperbarics well. She has made a tremendous improvement in the right forefoot with only 2 smaller open wounds. Using silver collagen. On the left foot she has the area on the tip of the left third toe and the medial part of the left fourth toe. These had exposed bone last week I did not sense any of that today 2/12; sees Dr. Graylon Good on 2/17 and Dr. Gwenlyn Found on 2/23. We are going to use Dermagraft on this today however the lateral part of her train TMA incision on the right is healed and the medial part is down so much that we just continue with the silver collagen She has wounds on the tip of her left third and the medial aspect of the left fourth. Both of these still have exposed bone I have not been able to get these to epithelialize. 2/19; she sees Dr. Gwenlyn Found on 2/23 and I have communicated with him about the left vascular supply. Looking at her angiogram from 08/03/2019 it looks as though that they could not cross  the left CFA. Noted that she had one-vessel runoff bilaterally. She also apparently saw Dr. Graylon Good although I  did not look up these results for preparation of this record. 2/26; Dr. Gwenlyn Found is going to do an angiogram next week on Wednesday. I think they are going to try to go at this both retrograde and anterograde to see if anything can be done to revascularize the left lower limb. She continues to make nice progress on the remaining wound on the right medial foot on her TMA site and the toe it wounds on the left are responding nicely as well 3/12; the patient underwent an extensive and complicated revascularization on the left leg by Dr. Fletcher Anon on 3/3; she had an atherectomy on occluded left popliteal artery; anterior tibial artery followed by a balloon angioplasty. Atherectomy was also performed to the left SFA because there was still significant 50% stenosis in the left popliteal artery they performed an intravascular lithotripsy which improved the residual stenosis to 20%. The same lithotripsy was used to dilate the proximal left SFA. She had a drug-eluting stent placed I believe in the SFA. The patient returned for hyperbarics this week. She has had some eye problems on the right which she tells me are secondary to diabetic retinopathy and she saw her eye doctor. She is really made excellent progress there is no open wounds on the left foot at all. She has one open area medially and her TMA site on the right lateral wound is closed. 3/19; the patient comes in today with the area on the medial transmet on the right improved. Some debris removed from the surface revealed the still open wound. ooUnfortunately she now has the open area on the third toe tip and the medial aspect of the dorsal fourth toe. These are in the same location as her previous wounds. These had actually closed up last week. 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 She is undergone complex revascularizations on the right by Dr. Trula Slade and Dr. Gwenlyn Found and I believe Dr. Fletcher Anon worked on the  tibial vessels on the left most recently.she had a complex transmetatarsal amputation wound with underlying osteomyelitis on presentation to the clinic and then developed deep wounds on the plantar left third toe tip and on the medial part of the left fourth toe at the PIP. She completed IVantibiotics as directed by infectious disease and I believe a course of oral antibiotics as well.she underwent a complete 40 treatments of hyperbarics. She comes in today with the vast majority of the right TMA site healed there is a small opening on the most medial aspect. Her left third and left fourth toes appear to have epithelialization although this has fluctuated somewhat. 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 She had a gaping wound on the right transmetatarsal amputation L as well as deep wounds on the left third and left fourth toes. She underwent complex revascularizations on her bilateral lower legs which I described on her last visit. She completed IV antibiotics by infectious disease and 40 treatments of hyperbarics. Her left third and fourth toes are healed. The area on the right transmetatarsal amputation site is also closed today. READMISSION 08/04/2020 [name] is now a 81 year old woman that we had for a complex stay in our clinic from October 2000 14 January 2020. 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 Since she was last in clinic she had an MRI  in June of this year and underwent a CABG on 03/24/20. Her current problems with wounds began on October 30 when she developed a spontaneous opening over the right transmet medial aspect over the first metatarsal head. She has been using collagen on this that she had leftover from her last stay in this clinic. She also has had an open area on the medial right calf from a vein harvest site from her CABG she. She said that this is never really closed since the vein harvest was done. With  regards to her arterial status this is a major issue. She had an angiogram by Dr. Gwenlyn Found in September 2020. She developed a gangrenous right first and second toes. Ultimately she was referred to Dr. Trula Slade who did a right common femoral endarterectomy with patch angioplasty. This was on 07/21/2019 and she really had a good result. Dr. Gwenlyn Found did a atherectomy followed by a drug coated balloon angioplasty of her right SFA popliteal and tibial peroneal trunk on 08/03/2019 again with excellent excellent results. Follow-up.Dopplers in November revealed revealed a widely patent SFA and tibioperoneal trunk. UNFORTUNATELY her recent angiogram that was done on 08/01/2020 showed an 80% right common femoral artery stenosis just above the previous endarterectomy site occluded right right SFA at the origin reconstituting the adductor canal by the profunda femoris collaterals. The profunda femoris is also diffusely diseased. There is and again another 80% tibioperoneal trunk stenosis and one-vessel runoff via the peroneal artery. She is seen Dr. Donzetta Matters and she is scheduled for a femoral endarterectomy and right femoral-popliteal bypass grafting with endarterectomy of her tibial peroneal trunk. The scheduled surgery is on 11/23 The patient does not complain of pain at rest. She does have 5-minute walking claudication. The wounds on her right first met head remanent was spontaneous she does not think she hit this. As mentioned in the area on her right medial calf is a vein harvest site Dr. Gwenlyn Found had texted me about getting her in the clinic and we have arranged this as soon as we can. I have told her that we will have to see how successful Dr. Donzetta Matters is before we know what can be done about the wounds in a precise fashion. Fortunately there does not seem to be any obvious infection involving the right foot although that may need to be looked at if things really deteriorate. She was treated with IV antibiotics and hyperbaric  oxygen for her previous osteomyelitis 12/2; the patient underwent a right femoral endarterectomy and bypass femoral to popliteal artery. This was done on 08/16/2020. She is done remarkably well. She has a small area on the right anterior tibia and a small area in the medial part of her original right transmetatarsal amputation. She appears to have had an excellent response to surgery. 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 Alkins is a now 81 year old woman who arrives for review of wounds on the left TMA site extending into her plantar foot as well as an area on the left posterior calcaneus. 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 She was extensively revascularized on the right at that time as noted above in our previous notes ended up receiving IV antibiotics and hyperbaric oxygen therapy. Miraculously this TMA site actually closed and she really has had not any  trouble with this since. UNFORTUNATELY the same cannot be said of her left side. Her problem apparently started sometime in July when she hit her left third toe on a barstool while playing pool. She developed an acute infection which eventually led to underlying osteomyelitis. She underwent an extensive hospitalization from 04/27/2021 through 05/15/2021. She received IV antibiotics. Also extensive intervention by Dr. Stanford Breed of vein and vascular which included a left common femoral to tibioperoneal trunk bypass as well as a left third toe amputation on 05/03/2021. Unfortunately the area did not heal she ended up requiring a left TMA. By her description this was left open. She has been using a wound VAC on this to earlier this week and then was switched to Santyl. I think because of the left forefoot off loader she is  also developed an area on the left posterior calcaneus. She is not complaining of a lot of pain. She has her daughter at home to change the dressings. She has been prescribed Santyl and she has a tube that she is brought into our clinic. I cannot get a lot of history of claudication although she is really not that mobile at this point walking with a walker. 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 She has had multiple interventions on the right side by vein and vascular and also by Dr. Gwenlyn Found as well as a transmetatarsal amputation on the right. She has had follow-up noninvasive studies on 06/06/2021; on the left that showed an ABI of 0.47 with monophasic waveforms a dorsalis pedis ABI of 0.40 with monophasic waveforms. Her ANGIOGRAM which was initially done by Dr. Oneida Alar on 04/28/2021 showed severe left foot superficial femoral artery occlusive disease and an occlusion of the left popliteal artery with two-vessel runoff to the left foot with moderate to severe disease below the knee popliteal artery and tibial disease. She had 80% distal right external iliac artery above the existing femoral above-knee popliteal bypass but she does not have an open wound on the right foot She arrives in clinic with an extensive open area on the TMA site which in the mid aspect extends into the plantar foot. She also has a more superficial area on the upper part of her Achilles area of the left heel 9/29; patient arrives in clinic today with slough over the TMA site. We use MolecuLight to look at the surface of this suggesting cyan and white discoloration over a large part of this wound also extending into the third metatarsal area. She also has the area on the left posterior calcaneus. This is slough covered we will switch to [facility] here as well. There is some warmth in the area  and some erythema we will give her antibiotics as well 10/6; completely necrotic surface once again on the left TMA site. We have been using Santyl and Hydrofera Blue. She has an area on the left posterior Achilles and actually states this has been more painful this week indeed there is some erythema around this area. She also describes I think claudication at rest which is relieved by dipping her left leg over the side of the bed at night. 10/13; our intake nurse was able to brush some slough off the surface of the left TMA hence this did not require debridement that is better than last week. The area heading towards the plantar part of her foot required  an aggressive debridement to clean this up. This is quite a sizable divot but does not go to bone. The area on the right Achilles heel is also covered in a high 100% nonviable tissue we have been using Santyl and silver alginate 10/20; the area on the left TMA looks much better. Even the area is spreading into the midfoot looks improved. The right Achilles still is covered and nonviable surface 100%. We have been using Santyl and silver alginate and this will continue for this week The patient is approved for 5 Apligraf's and I think she is good enough to start using these next week 10/27; the patient's wounds actually look quite good. We have been using Santyl and silver alginate. We applied Apligraf #1 today Saw Dr. Stanford Breed her vascular surgeon on 10/25 he told her TMA was healing nicely. She will follow-up with them in December to repeat noninvasive testing. 08/16/2021 upon evaluation today patient appears to be doing much better in regard to her wounds. Both are showing signs of improvement which is great news. I am going to perform some debridement to clear away some of the necrotic debris currently this will just be a light debridement. Nonetheless other than that I feel like that the Apligraf is doing quite well working and reapply today and  this will be #3 as far as the application is concerned. 12/20; since the patient was last here she was seen by Dr. Stanford Breed who felt that her Doppler flow was sluggish in the bypass. She therefore went underwent a urgent angiogram on 09/07/2021 by Dr. Carlis Abbott. She had an angioplasty of the distal anastomosis and the anterior tibial artery. She tolerated this well. She had no flow-limiting stenosis in the aortoiliac segment on the left. Some calcification in the left common iliac artery but it was not flow-limiting the common femoral and profunda were patent. The left lower extremity bypass had brisk flow with no significant proximal anastomosis on the common femoral artery. Distally there was a high-grade stenosis where the bypass anastomosis was performed. This was angioplastied as well as the anterior tibial artery. 12/28; the patient comes in early because of a wound over the tip of her right first metatarsal head. She is not really sure how this happened. Says she noticed it when she took her sock off on Friday before she got in the shower. She has been using Hydrofera Blue to this area. This is the original wounded site we dealt with in the setting of a previous right TMA. The patient has PA I researched this on [place] link. As it turns out she has had recent arterial studies noninvasive on 09/05/2021. On the right she had dampened monophasic waveforms at the popliteal absent waveforms at the PTA and dorsalis pedis. An ABI could not be obtained previously at 0.47. The comment states that they were unable to detect Doppler waveforms of the posterior tibial and dorsalis pedis arteries. 2023 09/26/2021; her original left TMA wound is almost closed. I reapplied Apligraf to the remaining area. The area on the plantar foot is also closed as far as I can tell HOWEVER she comes in with a second wound on the right first metatarsal head remanent both of these probe to bone. I did a bone scraping of this last  week which only showed Corynebacterium unlikely to be a true pathogen. Nevertheless I am going to put her on Augmentin today probably for 2 weeks. X-ray will be done today. She has an appointment with Dr.  Kane at vein and vascular next Tuesday I will send him a note about my concerns about the right TM 1/10; we inadvertently remove the Apligraf from the left surgical site however everything looks closed here. She still has 2 probing wounds over the remanent of her first metatarsal head both go to bone although there is less erythema and swelling in the area. She does not have an appointment with Dr. Donzetta Matters until next Tuesday. I am going to go ahead and order an MRI of the right foot I had originally planned to have or at least consider HBO if the patient has underlying osteomyelitis in the right foot however she tells me that she lost a significant amount of her visual field during her last run with HBO in the right eye nasal aspect. She saw Dr. Zadie Rhine who is a retinal specialist I will see if I can look at his records. She is still on Augmentin as finishes on Monday. 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 She has been on Augmentin with some improvement in the surrounding erythema although the small wound on the tip and the plantar aspect still probe easily to bone and there does not appear to be any viable tissue. UNFORTUNATELY arrives in clinic today with new areas on the right lateral foot neither 1 of these look particularly healthy. No debridement. The patient has an appoint with Dr. Donzetta Matters this morning my question is angiography of the right lego. I also wonder about an infectious disease appointment and I will continue her Augmentin but refer her for consideration. I did not get a culture that was positive even from a  bone scraping however. 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 She saw her retina doctor Dr. Deloria Lair and her interpretation of this was that the hyperbarics may have contributed to a blockage. I wonder whether this was a central retinal vein or artery occlusion. I am not sure that Dr. Zadie Rhine documents in epic I will need to see if I can find his note 1/25; this is [name] returns to clinic having a very eventful week. Firstly she underwent angiography by Dr. Stanford Breed of vein and vascular.. Her preintervention ABI was 0. She underwent a right iliofemoral drug-coated balloon angioplasty, right popliteal drug-coated balloon angioplasty and a right tibial peroneal trunk and peroneal angioplasty by Dr. Stanford Breed. She tolerated this well. She definitely has better blood flow to the right foot. She saw Dr. Doren Custard in follow-up who felt her Doppler flow in the right foot was good. An x-ray did not show any evidence of osteomyelitis however I noted a previous MRI certainly did. She also saw infectious diseases been started on IV daptomycin and ertapenem. She had the PICC line placed. I had started her on Augmentin on February 3 and that had some improvement in the erythema and swelling over the remanent of her first metatarsal head. As far as I am aware she did not have a specific organism cultured at least not by me although I did try. Everything on the left is closed including the left transmit amputation site and the area on  her left posterior heel/Achilles. We are using silver collagen on the right foot now which has 2 open wounds on the right first metatarsal head and the right fifth metatarsal head 2/1; the patient's left foot transmit site and the area on her heel remain closed. 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 The areas are small but probe to bone. 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 Her right transmetatarsal amputation site appears less erythematous and less swollen however the wound on the first met head probes to bone. The bone itself does not feel that vibrant. The wounds on the fifth metatarsal head also on the right have eschar on the surface I did not consider debriding this today. 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 Dr. 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 We will therefore pursue hyperbaric oxygen therapy and treatment of the underlying refractory osteomyelitis 2/15; the patient's right foot actually looks a lot better 2 areas over the fifth met head are closed. 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 She is on IV antibiotics and has been approved for hyperbarics. In the meantime the whole right first met head remanent looks a lot better with antibiotics less swelling and less erythema but still with the open area probing to bone 2/22; saw Dr. Luan Pulling and then intravascular yesterday. She apparently had her  arterial studies done. I have not reviewed these today however he stated he was well satisfied. She is still on IV antibiotics and tolerating hyperbaric oxygen well. Her largest wound is a small open area of the probes the bone over her right fifth metatarsal head remnant. She's been using silver collagen 11/22/2021: She continues on IV antibiotics but states that her last dose will be next Monday. She is tolerating hyperbaric oxygen therapy without difficulty. The wound over her first metatarsal head continues to contract. There is crusty nonviable tissue overlying this. This was debrided with a curette. 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 I removed this and was relieved that there is no open wound underneath. 12/06/2021: She is here today after her hyperbaric oxygen treatment. She continues to tolerate this well. The wound over her right first metatarsal head continues to, but still has a buildup of crusty nonviable tissue, similar to her prior visit. Her left TMA site had some dry skin across the scar area. Her home health nurse noticed a small amount of drainage coming from the site. I removed the dry skin and identified is a pinpoint opening at the center of the amputation site  with a clean base 12/20/2021: Today was her last hyperbaric oxygen treatment. She continues to have an opening over the right first metatarsal head that probes to bone. The left TMA opening that was identified at her last visit remains open. Both have good granulation tissue on the wound surfaces. 01/03/2022: At her last visit, we extended her hyperbaric oxygen therapy for another 20 treatments. The opening at the right first metatarsal head has closed. The left TMA opening has a tiny area that has not yet closed. We have been using Prisma and she has been wearing Darco forefoot offloading shoes. 01/17/2022: At her  last visit, we closed out the right first metatarsal head site and the left TMA opening had a tiny area that was still not closed. Today, the TMA opening is closed and the right first metatarsal head has a pinpoint opening underneath some callus. She is due to complete her hyperbaric oxygen treatments this Friday. 01/31/2022: Her wounds are closed. READMISSION 03/26/2022 Talaysia Pinheiro is well-known to our service. 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 Apparently she had a little bit of drainage starting to come at the site and when she removed her stocking, the wound opened. She denies any fevers or chills. No nausea or vomiting. She has been using some of the silver alginate that she had at home as a dressing. Patient History Information obtained from Patient. Allergies Demerol, scopolamine, Bactrim (Reaction: increased K+), Versed (Reaction: agitation), tramadol (Reaction: kept awake) Family History Cancer - Mother, Diabetes - Maternal Grandparents, Hypertension - Mother, Stroke - Mother,Maternal Grandparents, No family history of Heart Disease, Hereditary Spherocytosis, Kidney Disease, Lung Disease, Seizures, Thyroid Problems, Tuberculosis. Social History Former smoker, Marital Status - Widowed, Alcohol Use - Never, Drug Use - No History, Caffeine Use - Daily. 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, Peripheral Arterial  Disease Denies history of Angina, Arrhythmia, Congestive Heart Failure, Hypotension, Peripheral Venous Disease, Phlebitis, Vasculitis Gastrointestinal Denies history of Cirrhosis , Colitis, Crohnoos, Hepatitis A, Hepatitis B, Hepatitis C Endocrine Patient has history of Type II Diabetes Denies history of Type I Diabetes Genitourinary Denies history of End Stage Renal Disease Immunological Denies history of Lupus Erythematosus, Raynaudoos, Scleroderma Integumentary (Skin) Denies history of History of Burn Musculoskeletal Denies history of Gout, Rheumatoid Arthritis, Osteoarthritis, Osteomyelitis Neurologic Denies history of Dementia, Neuropathy, Quadriplegia, Paraplegia, Seizure Disorder Oncologic Denies history of Received Chemotherapy, Received Radiation Psychiatric Denies history of Anorexia/bulimia, Confinement Anxiety Medical A Surgical History Notes nd Cardiovascular CABG x 5 Review of Systems (ROS) Integumentary (Skin) Complains or has symptoms of Wounds - Diabetic Foot Ulcer (R). Objective Constitutional Slightly hypertensive. No acute distress. Vitals Time Taken: 7:55 AM, Height: 65 in, Source: Stated, Weight: 145 lbs, Source: Stated, BMI: 24.1, Temperature: 97.5 F, Pulse: 62 bpm, Respiratory Rate: 16 breaths/min, Blood Pressure: 148/75 mmHg. Respiratory Normal work of breathing on room air.. General Notes: 03/26/2022: On the right transmetatarsal amputation site, where the great toe would have been, there is some very hard callus and an opening in the skin covered with eschar. Once this was removed, there was purulent drainage identified. No odor. Integumentary (Hair, Skin) Wound #21  status is Open. Original cause of wound was Gradually Appeared. The date acquired was: 03/16/2022. 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 The wound measures 0.3cm length x 0.2cm width x 0.1cm depth; 0.047cm^2 area and 0.005cm^3 volume. There is Fat  Layer (Subcutaneous Tissue) exposed. There is no tunneling or undermining noted. There is a medium amount of serosanguineous drainage noted. There is no granulation within the wound bed. There is a large (67-100%) amount of necrotic tissue within the wound bed including Eschar. Assessment Active Problems ICD-10 Type 2 diabetes mellitus with foot ulcer Non-pressure chronic ulcer of other part of right foot with fat layer exposed Procedures Wound #21 Pre-procedure diagnosis of Wound #21 is a Diabetic Wound/Ulcer of the Lower Extremity located on the Right Amputation Site - Transmetatarsal .Severity of Tissue Pre Debridement is: Fat layer exposed. There was a Excisional Skin/Subcutaneous Tissue Debridement with a total area of 0.06 sq cm performed by [name], MD. With the following instrument(s): Curette to remove Non-Viable tissue/material. Material removed includes Callus, Subcutaneous Tissue, and Slough after achieving pain control using Lidocaine 4% T opical Solution. 1 specimen was taken by a Tissue Culture and sent to the lab per facility protocol. A time out was conducted at 08:24, prior to the start of the procedure. A Minimum amount of bleeding was controlled with Pressure. The procedure was tolerated well with a pain level of 0 throughout and a pain level of 0 following the procedure. Post Debridement Measurements: 0.3cm length x 0.2cm width x 0.1cm depth; 0.005cm^3 volume. Character of Wound/Ulcer Post Debridement is improved. Severity of Tissue Post Debridement is: Fat layer exposed. Post procedure Diagnosis Wound #21: Same as Pre-Procedure Plan 03/26/2022: On the right transmetatarsal amputation site, where the great toe would have been, there is some very hard callus and an opening in the skin covered with eschar. Once this was removed, there was purulent drainage identified. No odor. I used a curette to debride callus, eschar, and nonviable subcutaneous tissue from the wound  site. I took a culture of the purulent drainage. As she has been on multiple antibiotics in the past, I am going to await culture data before prescribing any oral antibiotic. The last culture I was able to see in our system showed just corynebacterium so I am going to apply topical mupirocin and silver alginate. She will call us back if she does not have any mupirocin at home, although she thinks she may have a tube left. Follow-up in 1 week. Electronic Signature(s) Signed: 03/26/2022 8:40:02 AM By: [name] MD FACS Signed: 03/26/2022 8:40:02 AM By: [name] MD FACS Entered By: [name] on 03/26/2022 08:40:01 -------------------------------------------------------------------------------- HxROS Details Patient Name: Date of Service: [name], [name] 03/26/2022 8:00 A M Medical Record Number: [id number] Patient Account Number: [id number] Date of Birth/Sex: Treating RN: [birth year] (81 y.o. [name] Primary Care Provider: [name] Other Clinician: Referring Provider: Treating Provider/[name]: Mardi Mainland in Treatment: 0 Information Obtained From Patient Integumentary (Skin) Complaints and Symptoms: Positive for: Wounds - Diabetic Foot Ulcer (R) Medical History: Negative for: History of Burn Eyes Medical History: Negative for: Cataracts; Glaucoma; Optic Neuritis Ear/Nose/Mouth/Throat Medical History: Negative for: Chronic sinus problems/congestion; Middle ear problems Hematologic/Lymphatic Medical History: Negative for: Anemia; Hemophilia; Human Immunodeficiency Virus; Lymphedema; Sickle Cell Disease Respiratory Medical History: Negative for: Aspiration; Asthma; Chronic Obstructive Pulmonary Disease (COPD); Pneumothorax; Sleep Apnea; Tuberculosis Cardiovascular Medical History: Positive for: Coronary Artery Disease; Deep Vein Thrombosis; Hypertension; Myocardial Infarction -  July 2021; Peripheral Arterial  Disease Negative for: Angina; Arrhythmia; Congestive Heart Failure; Hypotension; Peripheral Venous Disease; Phlebitis; Vasculitis Past Medical History Notes: CABG x 5 Gastrointestinal Medical History: Negative for: Cirrhosis ; Colitis; Crohns; Hepatitis A; Hepatitis B; Hepatitis C Endocrine Medical History: Positive for: Type II Diabetes Negative for: Type I Diabetes Time with diabetes: 24 Treated with: Oral agents Blood sugar tested every day: Yes Tested : daily Genitourinary Medical History: Negative for: End Stage Renal Disease Immunological Medical History: Negative for: Lupus Erythematosus; Raynauds; Scleroderma Musculoskeletal Medical History: Negative for: Gout; Rheumatoid Arthritis; Osteoarthritis; Osteomyelitis Neurologic Medical History: Negative for: Dementia; Neuropathy; Quadriplegia; Paraplegia; Seizure Disorder Oncologic Medical History: Negative for: Received Chemotherapy; Received Radiation Psychiatric Medical History: Negative for: Anorexia/bulimia; Confinement Anxiety Immunizations Pneumococcal Vaccine: Received Pneumococcal Vaccination: No Implantable Devices None Family and Social History Cancer: Yes - Mother; Diabetes: Yes - Maternal Grandparents; Heart Disease: No; Hereditary Spherocytosis: No; Hypertension: Yes - Mother; Kidney Disease: No; Lung Disease: No; Seizures: No; Stroke: Yes - Mother,Maternal Grandparents; Thyroid Problems: No; Tuberculosis: No; Former smoker; Marital Status - Widowed; Alcohol Use: Never; Drug Use: No History; Caffeine Use: Daily; Financial Concerns: No; Food, Clothing or Shelter Needs: No; Support System Lacking: No; Transportation Concerns: No Electronic Signature(s) Signed: 03/26/2022 9:11:25 AM By: Dellie Catholic RN Signed: 03/26/2022 11:38:54 AM By: [name] MD FACS Entered By: Dellie Catholic on 03/26/2022 08:02:14 -------------------------------------------------------------------------------- SuperBill  Details Patient Name: Date of Service: [name], [name] 03/26/2022 Medical Record Number: [id number] Patient Account Number: [id number] Date of Birth/Sex: Treating RN: 1940-10-19 (81 y.o. F) Primary Care Provider: [name] Other Clinician: Referring Provider: Treating Provider/[name]: Mardi Mainland in Treatment: 0 Diagnosis Coding ICD-10 Codes Code Description E11.621 Type 2 diabetes mellitus with foot ulcer L97.512 Non-pressure chronic ulcer of other part of right foot with fat layer exposed Facility Procedures CPT4 Code: [id number] Description: [place] VISIT-LEV 3 EST PT Modifier: 25 Quantity: 1 CPT4 Code: [id number] Description: [facility] - DEB SUBQ TISSUE 20 SQ CM/< ICD-10 Diagnosis Description L97.512 Non-pressure chronic ulcer of other part of right foot with fat layer expos Modifier: ed Quantity: 1 Physician Procedures : CPT4 Code Description Modifier [id number] - WC PHYS LEVEL 4 - EST PT 25 ICD-10 Diagnosis Description E11.621 Type 2 diabetes mellitus with foot ulcer L97.512 Non-pressure chronic ulcer of other part of right foot with fat layer exposed Quantity: 1 : [id number] - WC PHYS SUBQ TISS 20 SQ CM ICD-10 Diagnosis Description L97.512 Non-pressure chronic ulcer of other part of right foot with fat layer exposed Quantity: 1 Electronic Signature(s) Signed: 03/26/2022 9:11:25 AM By: Dellie Catholic RN Signed: 03/26/2022 11:38:54 AM By: [name] MD FACS Previous Signature: 03/26/2022 8:40:17 AM Version By: [name] MD FACS Entered By: Dellie Catholic on 03/26/2022 09:08:55

## 2022-04-02 ENCOUNTER — Encounter (HOSPITAL_BASED_OUTPATIENT_CLINIC_OR_DEPARTMENT_OTHER): Payer: Medicare Other | Admitting: General Surgery

## 2022-04-02 DIAGNOSIS — E11621 Type 2 diabetes mellitus with foot ulcer: Secondary | ICD-10-CM | POA: Diagnosis not present

## 2022-04-02 DIAGNOSIS — E1151 Type 2 diabetes mellitus with diabetic peripheral angiopathy without gangrene: Secondary | ICD-10-CM | POA: Diagnosis not present

## 2022-04-02 DIAGNOSIS — L97512 Non-pressure chronic ulcer of other part of right foot with fat layer exposed: Secondary | ICD-10-CM | POA: Diagnosis not present

## 2022-04-02 DIAGNOSIS — Z09 Encounter for follow-up examination after completed treatment for conditions other than malignant neoplasm: Secondary | ICD-10-CM | POA: Diagnosis not present

## 2022-04-05 ENCOUNTER — Other Ambulatory Visit: Payer: Medicare Other

## 2022-04-05 DIAGNOSIS — Z89421 Acquired absence of other right toe(s): Secondary | ICD-10-CM

## 2022-04-05 DIAGNOSIS — E1142 Type 2 diabetes mellitus with diabetic polyneuropathy: Secondary | ICD-10-CM

## 2022-04-05 DIAGNOSIS — Z89412 Acquired absence of left great toe: Secondary | ICD-10-CM

## 2022-04-05 DIAGNOSIS — Z89422 Acquired absence of other left toe(s): Secondary | ICD-10-CM

## 2022-04-05 DIAGNOSIS — Z89411 Acquired absence of right great toe: Secondary | ICD-10-CM

## 2022-04-06 NOTE — Progress Notes (Signed)
[name], [name] ([id number]) Visit Report for 04/02/2022 Chief Complaint Document Details Patient Name: Date of Service: [name] 04/02/2022 9:30 A M Medical Record Number: [id number] Patient Account Number: [id number] Date of Birth/Sex: Treating RN: 27-Oct-1940 (81 y.o. [name] Primary Care Provider: [name] Other Clinician: Referring Provider: Treating Provider/[name]: Mardi Mainland in Treatment: 1 Information Obtained from: Patient Chief Complaint 07/13/2019; patient is here for review wounds on the toes of her right foot and the left fourth toe 08/04/2020; patient is here for review of wounds at the anterior part of her right first met head/TMA site and another area on the right medial calf at a vein harvest site. 03/26/2022: Reopening of right transmetatarsal scar at first metatarsal head. Electronic Signature(s) Signed: 04/02/2022 10:02:49 AM By: [name] MD FACS Entered By: [name] on 04/02/2022 10:02:49 -------------------------------------------------------------------------------- Debridement Details Patient Name: Date of Service: [name], [name]. 04/02/2022 9:30 A M Medical Record Number: [id number] Patient Account Number: [id number] Date of Birth/Sex: Treating RN: 1941/09/09 (81 y.o. [name] Primary Care Provider: [name] Other Clinician: Referring Provider: Treating Provider/[name]: Mardi Mainland in Treatment: 1 Debridement Performed for Assessment: Wound #21 Right Amputation Site - Transmetatarsal Performed By: Physician [name], MD Debridement Type: Debridement Severity of Tissue Pre Debridement: Fat layer exposed Level of Consciousness (Pre-procedure): Awake and Alert Pre-procedure Verification/Time Out Yes - 09:40 Taken: Start Time: 09:42 Pain Control: Lidocaine 4% T opical Solution T Area Debrided (L x W): otal 0.4 (cm) x 0.4  (cm) = 0.16 (cm) Tissue and other material debrided: Non-Viable, Eschar Level: Non-Viable Tissue Debridement Description: Selective/Open Wound Instrument: Curette Bleeding: Minimum Hemostasis Achieved: Pressure Procedural Pain: 0 Post Procedural Pain: 0 Response to Treatment: Procedure was tolerated well Level of Consciousness (Post- Awake and Alert procedure): Post Debridement Measurements of Total Wound Length: (cm) 0.2 Width: (cm) 0.2 Depth: (cm) 0.1 Volume: (cm) 0.003 Character of Wound/Ulcer Post Debridement: Improved Severity of Tissue Post Debridement: Fat layer exposed Post Procedure Diagnosis Same as Pre-procedure Electronic Signature(s) Signed: 04/02/2022 12:30:27 PM By: [name] MD FACS Signed: 04/06/2022 12:33:08 PM By: Sharyn Creamer RN, BSN Entered By: Sharyn Creamer on 04/02/2022 09:43:40 -------------------------------------------------------------------------------- HPI Details Patient Name: Date of Service: [name], [name] 04/02/2022 9:30 A M Medical Record Number: [id number] Patient Account Number: [id number] Date of Birth/Sex: Treating RN: 03-01-1941 (81 y.o. [name] Primary Care Provider: [name] Other Clinician: Referring Provider: Treating Provider/[name]: Mardi Mainland in Treatment: 1 History of Present Illness HPI Description: ADMISSION 07/13/2019 Patient is a 81 year old type II diabetic. She has known PAD. She has been followed by Dr. Jacqualyn Posey of podiatry for blistering areas on her toes dating back to 04/30/2019 which at this was the left fourth toe. By 8/11 she had wounds on the right and left second toes. She underwent arterial studies by Dr. Alvester Chou on 7/31 that showed ABIs on the right of 0.60 TBI of 0.26 on the left ABI of 0.56 and a TBI of 0.25. By 9/10 she had ischemic-looking wounds per Dr. Earleen Newport on the right first, left first second and third. She has been using Medihoney and then  mupirocin more recently simply Betadine. The patient underwent angiography by Dr. Gwenlyn Found on 9/21. 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 Noted that there was 1 vessel runoff in  the foot via the peroneal. On the left there was an 80% left SFA, 70% mid left SFA. There was a short segment calcified CTO distal less than SFA and above-knee popliteal artery reconstituting with two-vessel runoff. 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 It was felt that she had bilateral SFA disease as well as tibial vessel disease. An attempt was made to revascularize the left SFA but they were unable to cross because of the highly calcified nature of the lesion. 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 She has an area on the medial left fourth toe with raised horned callus on top of this. I am not certain what this represents. With regards to pain she has about a 2-1/2 I will claudication tolerance in the grocery store. She has some pain in night which is relieved by putting her feet down over the side of the bed. She is wearing Nitro-Dur patches on the top of her right foot. Past medical history includes type 2 diabetes with secondary PAD, neoplasm of the skin, diabetic retinopathy, carotid artery stenosis, hyperlipidemia hypertension. 11/2; the last time the patient was here I spoke to Dr. Gwenlyn Found about revascularization options on the right. As I understand things currently Dr. Gwenlyn Found spoke with Dr. Trula Slade and ultimately the patient was taken to surgery on 10/27. She had a right iliofemoral endarterectomy with a bovine patch angioplasty. I think the plan now is for her to have a staged intervention on the right SFA by Dr. Alvester Chou. Per the patient's understanding Dr. Gwenlyn Found and Dr.  Jacqualyn Posey are waiting to see when the gangrenous toes on the right foot can be amputated. The patient states her pain is a lot better and she is grateful for that. 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 Small area on the left fourth toe. She is using Betadine to the mummified areas on the right and Medihoney on the left 08/27/2019; the last time I saw this patient I discharged her from the clinic. She had been revascularized by Dr. Trula Slade and she had a right iliofemoral endarterectomy with a bovine angioplasty. 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 Jacqualyn Posey of podiatry on 08/07/2019. I note that she also had a intervention by Dr. Gwenlyn Found and he performed a directional atherectomy and drug-coated balloon angioplasty of the SFA and popliteal artery on the right. I am not certain of the date of Dr. Kennon Holter procedure as of the time of this dictation. She was referred back to [place] by Dr. Earleen Newport predominantly for follow-up of the left fourth toe. She still has sutures and stitches in the right TMA site. She states her pain is a lot better. She expresses concern about the condition of the amputation site at the TMA. She is on doxycycline I think prescribed by Dr. Earleen Newport. She is complaining of some pain at night 12/10; I spoke to Dr. Jacqualyn Posey last week. He removed the sutures on the right foot on Monday of this week. She has the area on the left fourth toe just proximal to the PIP and then the right TMA site. She is still on doxycycline and has enough through next week. Unfortunately the TMA site does not look good at all. Both on the lateral and medial part of the incisions are areas that probe to bone.  There is purulence over the medial part which I have cultured. We will use silver alginate. Left fourth toe looks somewhat better but there was still exposed bone 12/17; patient has an MRI booked for 12/30. Culture I did last  week showed Pseudomonas Serratia and Enterococcus. This was purulent drainage coming out of the medial part of her amputation site. I use cefdinir 300 twice daily for 10 days that started on 12/15. Her x-ray on the right showed limited evaluation for osteomyelitis. The findings could have been postoperative. There was subtle erosion in the distal first and distal fifth metatarsal. An MRI was suggested. On the left she had irregularity of the fourth middle and proximal phalanx consistent with a history of osteomyelitis. I do not know that she has a history of osteomyelitis in this area. She had a newly defined area on the plantar third toe 12/31; patient's MRI is listed below: MRI OF THE RIGHT FOREFOOT WITHOUT AND WITH CONTRAST TECHNIQUE: Multiplanar, multisequence MR imaging of the right forefoot was performed before and after the administration of intravenous contrast. CONTRAST 6 mL GADAVIST IV SOLN : COMPARISON: Plain films right foot 09/04/2019. FINDINGS: Bones/Joint/Cartilage The patient is status post transmetatarsal amputation as seen on the prior exam. Marrow edema and enhancement are seen in all of the distal metatarsals. In the first metatarsal, signal change extends 1.5 cm proximal to the stump and in the second metatarsal extends approximately 2 cm proximal to the stump. Edema and enhancement are seen in only the distal 0.7 cm of the third metatarsal stump and tips of the fourth and fifth metatarsals. Bone marrow signal is otherwise unremarkable. A small focus of subchondral edema is seen in the lateral talus, likely degenerative. Ligaments Intact. Muscles and Tendons No intramuscular fluid collection. Soft tissues Skin ulceration is seen off the stump of the first metatarsal. A thin fluid collection tracks deep to the wound and over the anterior metatarsals worrisome for small abscess. Intense subcutaneous edema and enhancement are seen diffusely. IMPRESSION: Status  post transmetatarsal amputation. Findings consistent with osteomyelitis are seen in the distal metatarsals, most extensive in the first and second as described above. Cellulitis about the foot. Skin ulceration over the distal first metatarsal is identified with a thin fluid collection tracking anteriorly along the stump worrisome for abscess. Small focus of subchondral edema in the lateral talus is likely degenerative. Electronically Signed By: Inge Rise M.D. On: 09/23/2019 15:25 Patient arrives in clinic today not complaining any of any pain. 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 She does not describe claudication however her activity is very limited. 10/01/2019; since the patient was last here I have communicated with Dr. Gwenlyn Found after bypass by Dr. Trula Slade and addressing the right superficial femoral artery he states that she is widely patent through peroneal arteries to the ankle with collaterals to the dorsalis pedis. He states he is going to talk to colleagues about the feasibility of tibial pedal access. The patient seems infectious disease later this afternoon Dr. Baxter Flattery. in preparation for this she has been off antibiotics for 1 week and I went ahead and obtain pieces of the remanence of her first metatarsal for pathology and CandS. The patient is a candidate for hyperbaric oxygen. 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 1/15; considerable improvement in the wounds on her feet. We are using silver collagen. She follows with Dr. Baxter Flattery of infectious disease  and is on meropenem and daptomycin. She has been taught how to do this herself at home. Follows with Dr. Graylon Good at the end of treatment here. She has 2 wounds on the surgical TMA site 1 lateral and 1 medial the lateral 1 has regressed tremendously. The area medially still has some exposed bone although  the base of this looks healthy. 1/22; 2 separate wounds on the surgical TMA site. Both of these look satisfactory. The medial area does not have any exposed bone. This is an improvement On the left side fourth toe dorsally over the proximal phalanx there is a deep punched out area that probes to bone. She has an area on the tip of the left third toe. She also tells [place] that in HBO she traumatized her left Achilles and this is left her with a superficial wound area 1/28; weekly visit along with HBO. She has 5 wounds. T our punched out areas on the original TMA site on the right. Both of these appear to have contracted. o The area on the right no longer has exposed bone. She has an area on the tip of the left third toe and on the DIP of the left fourth toe. Both of these had surface callus that I removed and unfortunately they have small areas that both go to bone. She has a traumatic wound on the left Achilles area that happened in HBO and that appears better. She is tolerating her IV antibiotics well at home. She has home health changing the dressings and she is doing it once on the weekends. We have been using silver collagen. She has been extensively revascularized on the right by Dr. Trula Slade and subsequently by Dr. Gwenlyn Found. 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 Baxter Flattery of infectious disease on 2/17. Dr. Gwenlyn Found on 2/23 and according to her her antibiotics stopped on 2/23. She is tolerating hyperbarics well. She has made a tremendous improvement in the right forefoot with only 2 smaller open wounds. Using silver collagen. On the left foot she has the area on the tip of the left third toe and the medial part of the left fourth toe. These had exposed bone last week I did not sense any of that today 2/12; sees Dr. Graylon Good on 2/17 and Dr. Gwenlyn Found on 2/23. We are going to use  Dermagraft on this today however the lateral part of her train TMA incision on the right is healed and the medial part is down so much that we just continue with the silver collagen She has wounds on the tip of her left third and the medial aspect of the left fourth. Both of these still have exposed bone I have not been able to get these to epithelialize. 2/19; she sees Dr. Gwenlyn Found on 2/23 and I have communicated with him about the left vascular supply. Looking at her angiogram from 08/03/2019 it looks as though that they could not cross the left CFA. Noted that she had one-vessel runoff bilaterally. She also apparently saw Dr. Graylon Good although I did not look up these results for preparation of this record. 2/26; Dr. Gwenlyn Found is going to do an angiogram next week on Wednesday. I think they are going to try to go at this both retrograde and anterograde to see if anything can be done to revascularize the left lower limb. She continues  to make nice progress on the remaining wound on the right medial foot on her TMA site and the toe it wounds on the left are responding nicely as well 3/12; the patient underwent an extensive and complicated revascularization on the left leg by Dr. Fletcher Anon on 3/3; she had an atherectomy on occluded left popliteal artery; anterior tibial artery followed by a balloon angioplasty. Atherectomy was also performed to the left SFA because there was still significant 50% stenosis in the left popliteal artery they performed an intravascular lithotripsy which improved the residual stenosis to 20%. The same lithotripsy was used to dilate the proximal left SFA. She had a drug-eluting stent placed I believe in the SFA. The patient returned for hyperbarics this week. She has had some eye problems on the right which she tells me are secondary to diabetic retinopathy and she saw her eye doctor. She is really made excellent progress there is no open wounds on the left foot at all. She has one open  area medially and her TMA site on the right lateral wound is closed. 3/19; the patient comes in today with the area on the medial transmet on the right improved. Some debris removed from the surface revealed the still open wound. Unfortunately she now has the open area on the third toe tip and the medial aspect of the dorsal fourth toe. These are in the same location as her previous wounds. These had actually closed up last week. 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 She is undergone complex revascularizations on the right by Dr. Trula Slade and Dr. Gwenlyn Found and I believe Dr. 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 She completed IVantibiotics as directed by infectious disease and I believe a course of oral antibiotics as well.she underwent a complete 40 treatments of hyperbarics. She comes in today with the vast majority of the right TMA site healed there is a small opening on the most medial aspect. Her left third and left fourth toes appear to have epithelialization although this has fluctuated somewhat. 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 She had a gaping wound on the right transmetatarsal amputation L as well as deep wounds on the left third and left fourth toes. She underwent complex revascularizations on her bilateral lower legs which I described on her last visit. She completed IV antibiotics by infectious disease and 40 treatments of hyperbarics. Her left third and fourth toes are healed. The area on the right transmetatarsal amputation site is also closed today. READMISSION 08/04/2020 [name] is now a 81 year old woman that we had for a complex stay in our clinic from October 2000  14 January 2020. 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 Her current problems with wounds began on October 30 when she developed a spontaneous opening over the right transmet medial aspect over the first metatarsal head. She has been using collagen on this that she had leftover from her last stay in this clinic. She also has had an open area on the medial right calf from a vein harvest site  from her CABG she. She said that this is never really closed since the vein harvest was done. With regards to her arterial status this is a major issue. She had an angiogram by Dr. Gwenlyn Found in September 2020. She developed a gangrenous right first and second toes. Ultimately she was referred to Dr. Trula Slade who did a right common femoral endarterectomy with patch angioplasty. This was on 07/21/2019 and she really had a good result. Dr. Gwenlyn Found did a atherectomy followed by a drug coated balloon angioplasty of her right SFA popliteal and tibial peroneal trunk on 08/03/2019 again with excellent excellent results. Follow-up.Dopplers in November revealed revealed a widely patent SFA and tibioperoneal trunk. UNFORTUNATELY her recent angiogram that was done on 08/01/2020 showed an 80% right common femoral artery stenosis just above the previous endarterectomy site occluded right right SFA at the origin reconstituting the adductor canal by the profunda femoris collaterals. The profunda femoris is also diffusely diseased. There is and again another 80% tibioperoneal trunk stenosis and one-vessel runoff via the peroneal artery. She is seen Dr. Donzetta Matters and she is scheduled for a femoral endarterectomy and right femoral-popliteal bypass grafting with endarterectomy of her tibial peroneal trunk. The scheduled surgery is on 11/23 The patient does not complain of pain at rest. She does have  5-minute walking claudication. The wounds on her right first met head remanent was spontaneous she does not think she hit this. As mentioned in the area on her right medial calf is a vein harvest site Dr. Gwenlyn Found had texted me about getting her in the clinic and we have arranged this as soon as we can. I have told her that we will have to see how successful Dr. Donzetta Matters is before we know what can be done about the wounds in a precise fashion. Fortunately there does not seem to be any obvious infection involving the right foot although that may need to be looked at if things really deteriorate. She was treated with IV antibiotics and hyperbaric oxygen for her previous osteomyelitis 12/2; the patient underwent a right femoral endarterectomy and bypass femoral to popliteal artery. This was done on 08/16/2020. She is done remarkably well. She has a small area on the right anterior tibia and a small area in the medial part of her original right transmetatarsal amputation. She appears to have had an excellent response to surgery. 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 Schier is a now 81 year old woman who arrives for review of wounds on the left TMA site extending into her plantar foot as well as an area on the left posterior calcaneus. 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 She was extensively revascularized on the right at that time as noted above in our previous notes ended up receiving IV antibiotics and hyperbaric oxygen therapy. Miraculously this TMA site actually closed and she really has had not any trouble with this since. UNFORTUNATELY the same cannot be said of her left side. Her problem apparently started sometime in July when she hit her left third toe on a  barstool while playing pool. She developed an acute infection which eventually led to underlying osteomyelitis. She underwent an extensive hospitalization from 04/27/2021 through 05/15/2021. She received IV antibiotics. Also extensive intervention by Dr. Stanford Breed of vein and vascular which included a left  common femoral to tibioperoneal trunk bypass as well as a left third toe amputation on 05/03/2021. Unfortunately the area did not heal she ended up requiring a left TMA. By her description this was left open. She has been using a wound VAC on this to earlier this week and then was switched to Santyl. I think because of the left forefoot off loader she is also developed an area on the left posterior calcaneus. She is not complaining of a lot of pain. She has her daughter at home to change the dressings. She has been prescribed Santyl and she has a tube that she is brought into our clinic. I cannot get a lot of history of claudication although she is really not that mobile at this point walking with a walker. 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 She has had multiple interventions on the right side by vein and vascular and also by Dr. Gwenlyn Found as well as a transmetatarsal amputation on the right. She has had follow-up noninvasive studies on 06/06/2021; on the left that showed an ABI of 0.47 with monophasic waveforms a dorsalis pedis ABI of 0.40 with monophasic waveforms. Her ANGIOGRAM which was initially done by Dr. Oneida Alar on 04/28/2021 showed severe left foot superficial femoral artery occlusive disease and an occlusion of the left popliteal artery with two-vessel runoff to the left foot with moderate to severe disease below the knee popliteal artery and tibial disease. She had 80% distal right external iliac artery above the existing femoral above-knee popliteal bypass  but she does not have an open wound on the right foot She arrives in clinic with an extensive open area on the TMA site which in the mid aspect extends into the plantar foot. She also has a more superficial area on the upper part of her Achilles area of the left heel 9/29; patient arrives in clinic today with slough over the TMA site. We use MolecuLight to look at the surface of this suggesting cyan and white discoloration over a large part of this wound also extending into the third metatarsal area. She also has the area on the left posterior calcaneus. This is slough covered we will switch to [facility] here as well. There is some warmth in the area and some erythema we will give her antibiotics as well 10/6; completely necrotic surface once again on the left TMA site. We have been using Santyl and Hydrofera Blue. She has an area on the left posterior Achilles and actually states this has been more painful this week indeed there is some erythema around this area. She also describes I think claudication at rest which is relieved by dipping her left leg over the side of the bed at night. 10/13; our intake nurse was able to brush some slough off the surface of the left TMA hence this did not require debridement that is better than last week. The area heading towards the plantar part of her foot required an aggressive debridement to clean this up. This is quite a sizable divot but does not go to bone. The area on the right Achilles heel is also covered in a high 100% nonviable tissue we have been using Santyl and silver alginate 10/20; the area on the left TMA looks much better. Even the area is spreading into the midfoot looks improved. The right Achilles still is covered and nonviable surface 100%.  We have been using Santyl and silver alginate and this will continue for this week The patient is approved for 5 Apligraf's and I think she is good enough to start using these next week 10/27; the patient's  wounds actually look quite good. We have been using Santyl and silver alginate. We applied Apligraf #1 today Saw Dr. Stanford Breed her vascular surgeon on 10/25 he told her TMA was healing nicely. She will follow-up with them in December to repeat noninvasive testing. 08/16/2021 upon evaluation today patient appears to be doing much better in regard to her wounds. Both are showing signs of improvement which is great news. I am going to perform some debridement to clear away some of the necrotic debris currently this will just be a light debridement. Nonetheless other than that I feel like that the Apligraf is doing quite well working and reapply today and this will be #3 as far as the application is concerned. 12/20; since the patient was last here she was seen by Dr. Stanford Breed who felt that her Doppler flow was sluggish in the bypass. She therefore went underwent a urgent angiogram on 09/07/2021 by Dr. Carlis Abbott. She had an angioplasty of the distal anastomosis and the anterior tibial artery. She tolerated this well. She had no flow-limiting stenosis in the aortoiliac segment on the left. Some calcification in the left common iliac artery but it was not flow-limiting the common femoral and profunda were patent. The left lower extremity bypass had brisk flow with no significant proximal anastomosis on the common femoral artery. Distally there was a high-grade stenosis where the bypass anastomosis was performed. This was angioplastied as well as the anterior tibial artery. 12/28; the patient comes in early because of a wound over the tip of her right first metatarsal head. She is not really sure how this happened. Says she noticed it when she took her sock off on Friday before she got in the shower. She has been using Hydrofera Blue to this area. This is the original wounded site we dealt with in the setting of a previous right TMA. The patient has PA I researched this on [place] [facility]. As it turns out she has had  recent arterial studies noninvasive on 09/05/2021. On the right she had dampened monophasic waveforms at the popliteal absent waveforms at the PTA and dorsalis pedis. An ABI could not be obtained previously at 0.47. The comment states that they were unable to detect Doppler waveforms of the posterior tibial and dorsalis pedis arteries. 2023 09/26/2021; her original left TMA wound is almost closed. I reapplied Apligraf to the remaining area. The area on the plantar foot is also closed as far as I can tell HOWEVER she comes in with a second wound on the right first metatarsal head remanent both of these probe to bone. I did a bone scraping of this last week which only showed Corynebacterium unlikely to be a true pathogen. Nevertheless I am going to put her on Augmentin today probably for 2 weeks. X-ray will be done today. She has an appointment with Dr. Gwenlyn Saran at vein and vascular next Tuesday I will send him a note about my concerns about the right TM 1/10; we inadvertently remove the Apligraf from the left surgical site however everything looks closed here. She still has 2 probing wounds over the remanent of her first metatarsal head both go to bone although there is less erythema and swelling in the area. She does not have an appointment with Dr. Donzetta Matters  until next Tuesday. I am going to go ahead and order an MRI of the right foot I had originally planned to have or at least consider HBO if the patient has underlying osteomyelitis in the right foot however she tells me that she lost a significant amount of her visual field during her last run with HBO in the right eye nasal aspect. She saw Dr. Zadie Rhine who is a retinal specialist I will see if I can look at his records. She is still on Augmentin as finishes on Monday. I will see if she has had the MRI before considering additional antibiotics on Tuesday 10/10/2021; the patient's MRI is suggested osteomyelitis involving the first metatarsal stump from her  transmit there was no abscess. She has been on Augmentin with some improvement in the surrounding erythema although the small wound on the tip and the plantar aspect still probe easily to bone and there does not appear to be any viable tissue. UNFORTUNATELY arrives in clinic today with new areas on the right lateral foot neither 1 of these look particularly healthy. No debridement. The patient has an appoint with Dr. Donzetta Matters this morning my question is angiography of the right lego. I also wonder about an infectious disease appointment and I will continue her Augmentin but refer her for consideration. I did not get a culture that was positive even from a bone scraping however. 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 She saw her retina doctor Dr. Deloria Lair and her interpretation of this was that the hyperbarics may have contributed to a blockage. I wonder whether this was a central retinal vein or artery occlusion. I am not sure that Dr. Zadie Rhine documents in epic I will need to see if I can find his note 1/25; this is [name] returns to clinic having a very eventful week. Firstly she underwent angiography by Dr. Stanford Breed of vein and vascular.. Her preintervention ABI was 0. She underwent a right iliofemoral drug-coated balloon angioplasty, right popliteal drug-coated balloon angioplasty and a right tibial peroneal trunk and peroneal angioplasty by Dr. Stanford Breed. She tolerated this well. She definitely has better blood flow to the right foot. She saw Dr. Doren Custard in follow-up who felt her Doppler flow in the right foot was good. An x-ray did not show any evidence of osteomyelitis however I noted a previous MRI certainly did. She also saw infectious diseases been started on IV daptomycin and ertapenem.  She had the PICC line placed. I had started her on Augmentin on February 3 and that had some improvement in the erythema and swelling over the remanent of her first metatarsal head. As far as I am aware she did not have a specific organism cultured at least not by me although I did try. Everything on the left is closed including the left transmit amputation site and the area on her left posterior heel/Achilles. We are using silver collagen on the right foot now which has 2 open wounds on the right first metatarsal head and the right fifth metatarsal head 2/1; the patient's left foot transmit site and the area on her heel remain closed. Some eschar but I do not think this requires debridement She has areas on the right first and right fifth met heads underlying osteomyelitis she is  on IV daptomycin and ertapenem. The areas are small but probe to bone. 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 Her right transmetatarsal amputation site appears less erythematous and less swollen however the wound on the first met head probes to bone. The bone itself does not feel that vibrant. The wounds on the fifth metatarsal head also on the right have eschar on the surface I did not consider debriding this today. 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 Dr. 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 We will therefore pursue hyperbaric oxygen therapy and treatment of the underlying refractory osteomyelitis 2/15; the patient's right foot actually looks a lot better 2 areas over the fifth met head are closed. 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 She is on IV antibiotics and has been approved for hyperbarics. In the meantime the whole right first met head remanent looks a lot better with antibiotics less swelling and less erythema but still with the open area probing to bone 2/22; saw Dr. Luan Pulling and then intravascular yesterday. She apparently had her arterial studies done. I have not reviewed these today however he stated he was well satisfied. She is still on IV antibiotics and tolerating hyperbaric oxygen well. Her largest wound is a small open area of the probes the bone over her right fifth metatarsal head remnant. She's been using silver collagen 11/22/2021: She continues on IV antibiotics but states that her last dose will be next Monday. She is tolerating hyperbaric oxygen therapy without difficulty. The wound over her first metatarsal head continues to contract. There is crusty nonviable tissue overlying this. This was debrided with a curette. 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 I removed this and was relieved that there is no open wound underneath. 12/06/2021: She is here today after her hyperbaric oxygen treatment. She continues to tolerate this well. The wound over her right first metatarsal head continues to, but still has a buildup of crusty nonviable tissue, similar to her prior visit. Her left TMA site had some dry skin across the scar area. Her home health nurse noticed a small amount of drainage coming from the site. I removed the dry skin and identified is a pinpoint opening  at the center of the amputation site with a clean base 12/20/2021: Today was her last hyperbaric oxygen treatment. She continues to have an opening over the right first metatarsal head that probes to bone. The left TMA opening that was identified at her last visit remains open. Both have good granulation tissue on the wound surfaces. 01/03/2022: At her last visit, we extended her hyperbaric oxygen therapy for another 20 treatments. The opening at the right first metatarsal  head has closed. The left TMA opening has a tiny area that has not yet closed. We have been using Prisma and she has been wearing Darco forefoot offloading shoes. 01/17/2022: At her last visit, we closed out the right first metatarsal head site and the left TMA opening had a tiny area that was still not closed. Today, the TMA opening is closed and the right first metatarsal head has a pinpoint opening underneath some callus. She is due to complete her hyperbaric oxygen treatments this Friday. 01/31/2022: Her wounds are closed. READMISSION 03/26/2022 [name] is well-known to our service. 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 Apparently she had a little bit of drainage starting to come at the site and when she removed her stocking, the wound opened. She denies any fevers or chills. No nausea or vomiting. She has been using some of the silver alginate that she had at home as a dressing. 04/02/2022: The culture that I took last week grew out organism sensitive to Augmentin which was prescribed. She has been taking this without difficulty. Today, there is no purulent drainage from her wound and it is nearly closed, under a layer of eschar. Electronic Signature(s) Signed: 04/02/2022 10:04:20 AM By: [name] MD FACS Entered By: [name] on 04/02/2022 10:04:20 -------------------------------------------------------------------------------- Physical Exam  Details Patient Name: Date of Service: [name], [name]. 04/02/2022 9:30 A M Medical Record Number: [id number] Patient Account Number: [id number] Date of Birth/Sex: Treating RN: May 12, 1941 (81 y.o. [name] Primary Care Provider: [name] Other Clinician: Referring Provider: Treating Provider/[name]: Mardi Mainland in Treatment: 1 Constitutional Hypertensive, asymptomatic. . . . No acute distress.Marland Kitchen Respiratory Normal work of breathing on room air.. Notes 04/02/2022: Today, there is no purulent drainage from her wound and it is nearly closed, under a layer of eschar. Electronic Signature(s) Signed: 04/02/2022 10:04:59 AM By: [name] MD FACS Entered By: [name] on 04/02/2022 10:04:59 -------------------------------------------------------------------------------- Physician Orders Details Patient Name: Date of Service: [name], [name]. 04/02/2022 9:30 A M Medical Record Number: [id number] Patient Account Number: [id number] Date of Birth/Sex: Treating RN: 12-11-1940 (81 y.o. [name] Primary Care Provider: [name] Other Clinician: Referring Provider: Treating Provider/[name]: Mardi Mainland in Treatment: 1 Verbal / Phone Orders: No Diagnosis Coding ICD-10 Coding Code Description E11.621 Type 2 diabetes mellitus with foot ulcer L97.512 Non-pressure chronic ulcer of other part of right foot with fat layer exposed Follow-up Appointments ppointment in 1 week. - Dr Celine Ahr Room 3 Monday July 17th at 1:15pm Return A Edema Control - Lymphedema / SCD / Other Elevate legs to the level of the heart or above for 30 minutes daily and/or when sitting, a frequency of: - throughout the day Avoid standing for long periods of time. Exercise regularly Moisturize legs daily. - Lotions :Aquaphor, Sween etc. Wound Treatment Wound #21 - Amputation Site - Transmetatarsal Wound  Laterality: Right Cleanser: Soap and Water Every Other Day/30 Days Discharge Instructions: May shower and wash wound with dial antibacterial soap and water prior to dressing change. Cleanser: Wound Cleanser Every Other Day/30 Days Discharge Instructions: Cleanse the wound with wound cleanser prior to applying a clean dressing using gauze sponges, not tissue or cotton balls. Topical: Mupirocin Ointment Every Other Day/30 Days Discharge Instructions: Apply Mupirocin (Bactroban) as instructed Prim Dressing: KerraCel Ag Gelling Fiber Dressing, 2x2 in (silver alginate) Every Other Day/30 Days ary Discharge Instructions: Apply silver alginate to wound bed  as instructed Secondary Dressing: ABD Pad, 5x9 Every Other Day/30 Days Discharge Instructions: Apply over primary dressing as directed. Secondary Dressing: Woven Gauze Sponges 2x2 in Every Other Day/30 Days Discharge Instructions: Apply over primary dressing as directed. Secured With: [employer], 4.5x3.1 (in/yd) Every Other Day/30 Days Discharge Instructions: Secure with Kerlix as directed. Secured With: Paper Tape, 2x10 (in/yd) Every Other Day/30 Days Discharge Instructions: Secure dressing with tape as directed. Secured With: Administrator, sports Dressing, Size 5, 12x25 (in/yd) Every Other Day/30 Days Electronic Signature(s) Signed: 04/02/2022 10:08:00 AM By: [name] MD FACS Entered By: [name] on 04/02/2022 10:08:00 -------------------------------------------------------------------------------- Problem List Details Patient Name: Date of Service: [name], [name]. 04/02/2022 9:30 A M Medical Record Number: [id number] Patient Account Number: [id number] Date of Birth/Sex: Treating RN: [birth year] (81 y.o. [name] Primary Care Provider: [name] Other Clinician: Referring Provider: Treating Provider/[name]: Mardi Mainland in Treatment: 1 Active  Problems ICD-10 Encounter Code Description Active Date MDM Diagnosis E11.621 Type 2 diabetes mellitus with foot ulcer 03/26/2022 No Yes L97.512 Non-pressure chronic ulcer of other part of right foot with fat layer exposed 03/26/2022 No Yes Inactive Problems Resolved Problems Electronic Signature(s) Signed: 04/02/2022 10:02:35 AM By: [name] MD FACS Entered By: [name] on 04/02/2022 10:02:35 -------------------------------------------------------------------------------- Progress Note Details Patient Name: Date of Service: [name], [name]. 04/02/2022 9:30 A M Medical Record Number: [id number] Patient Account Number: [id number] Date of Birth/Sex: Treating RN: September 01, 1941 (81 y.o. [name] Primary Care Provider: [name] Other Clinician: Referring Provider: Treating Provider/[name]: Mardi Mainland in Treatment: 1 Subjective Chief Complaint Information obtained from Patient 07/13/2019; patient is here for review wounds on the toes of her right foot and the left fourth toe 08/04/2020; patient is here for review of wounds at the anterior part of her right first met head/TMA site and another area on the right medial calf at a vein harvest site. 03/26/2022: Reopening of right transmetatarsal scar at first metatarsal head. History of Present Illness (HPI) ADMISSION 07/13/2019 Patient is a 81 year old type II diabetic. She has known PAD. She has been followed by Dr. Jacqualyn Posey of podiatry for blistering areas on her toes dating back to 04/30/2019 which at this was the left fourth toe. By 8/11 she had wounds on the right and left second toes. She underwent arterial studies by Dr. Alvester Chou on 7/31 that showed ABIs on the right of 0.60 TBI of 0.26 on the left ABI of 0.56 and a TBI of 0.25. By 9/10 she had ischemic-looking wounds per Dr. Earleen Newport on the right first, left first second and third. She has been using Medihoney and then mupirocin more  recently simply Betadine. The patient underwent angiography by Dr. Gwenlyn Found on 9/21. 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 Noted that there was 1 vessel runoff in the foot via the peroneal. ooOn the left there was an 80% left SFA, 70% mid left SFA. There was a short segment calcified CTO distal less than SFA and above-knee popliteal artery reconstituting with two-vessel runoff. 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 It was felt that she had bilateral SFA disease as well as tibial vessel disease. An attempt was made to revascularize the left SFA but they were unable to cross because of the highly calcified nature of the lesion. 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 She has an area on the medial left fourth toe with raised horned callus on top of this. I am not certain what this represents. With regards to pain she has about a 2-1/2 I will claudication tolerance in the grocery store. She has some pain in night which is relieved by putting her feet down over the side of the bed. She is wearing Nitro-Dur patches on the top of her right foot. Past medical history includes type 2 diabetes with secondary PAD, neoplasm of the skin, diabetic retinopathy, carotid artery stenosis, hyperlipidemia hypertension. 11/2; the last time the patient was here I spoke to Dr. Gwenlyn Found about revascularization options on the right. As I understand things currently Dr. Gwenlyn Found spoke with Dr. Trula Slade and ultimately the patient was taken to surgery on 10/27. She had a right iliofemoral endarterectomy with a bovine patch angioplasty. I think the plan now is for her to have a staged intervention on the right SFA by Dr. Alvester Chou. Per the patient's understanding Dr. Gwenlyn Found and Dr. Jacqualyn Posey are  waiting to see when the gangrenous toes on the right foot can be amputated. The patient states her pain is a lot better and she is grateful for that. 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 Small area on the left fourth toe. She is using Betadine to the mummified areas on the right and Medihoney on the left 08/27/2019; the last time I saw this patient I discharged her from the clinic. She had been revascularized by Dr. Trula Slade and she had a right iliofemoral endarterectomy with a bovine angioplasty. 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 Jacqualyn Posey of podiatry on 08/07/2019. I note that she also had a intervention by Dr. Gwenlyn Found and he performed a directional atherectomy and drug-coated balloon angioplasty of the SFA and popliteal artery on the right. I am not certain of the date of Dr. Kennon Holter procedure as of the time of this dictation. She was referred back to [place] by Dr. Earleen Newport predominantly for follow-up of the left fourth toe. She still has sutures and stitches in the right TMA site. She states her pain is a lot better. She expresses concern about the condition of the amputation site at the TMA. She is on doxycycline I think prescribed by Dr. Earleen Newport. She is complaining of some pain at night 12/10; I spoke to Dr. Jacqualyn Posey last week. He removed the sutures on the right foot on Monday of this week. She has the area on the left fourth toe just proximal to the PIP and then the right TMA site. She is still on doxycycline and has enough through next week. Unfortunately the TMA site does not look good at all. Both on the lateral and medial part of the incisions are areas that probe to bone. There is purulence over the medial part which I have cultured. We will use silver alginate. Left fourth toe looks somewhat better but there was still exposed bone 12/17; patient has an MRI booked for 12/30. Culture I did last week showed  Pseudomonas Serratia and Enterococcus. This was purulent drainage coming out of the medial part of her amputation site. I use cefdinir 300 twice daily for 10 days that started on 12/15. Her x-ray on the right showed limited evaluation for osteomyelitis. The findings could have been  postoperative. There was subtle erosion in the distal first and distal fifth metatarsal. An MRI was suggested. On the left she had irregularity of the fourth middle and proximal phalanx consistent with a history of osteomyelitis. I do not know that she has a history of osteomyelitis in this area. She had a newly defined area on the plantar third toe 12/31; patient's MRI is listed below: MRI OF THE RIGHT FOREFOOT WITHOUT AND WITH CONTRAST TECHNIQUE: Multiplanar, multisequence MR imaging of the right forefoot was performed before and after the administration of intravenous contrast. CONTRAST 6 mL GADAVIST IV SOLN : COMPARISON: Plain films right foot 09/04/2019. FINDINGS: Bones/Joint/Cartilage The patient is status post transmetatarsal amputation as seen on the prior exam. Marrow edema and enhancement are seen in all of the distal metatarsals. In the first metatarsal, signal change extends 1.5 cm proximal to the stump and in the second metatarsal extends approximately 2 cm proximal to the stump. Edema and enhancement are seen in only the distal 0.7 cm of the third metatarsal stump and tips of the fourth and fifth metatarsals. Bone marrow signal is otherwise unremarkable. A small focus of subchondral edema is seen in the lateral talus, likely degenerative. Ligaments Intact. Muscles and Tendons No intramuscular fluid collection. Soft tissues Skin ulceration is seen off the stump of the first metatarsal. A thin fluid collection tracks deep to the wound and over the anterior metatarsals worrisome for small abscess. Intense subcutaneous edema and enhancement are seen diffusely. 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 Cellulitis about the foot. Skin ulceration over the distal first metatarsal is identified with a thin fluid collection tracking anteriorly along the stump worrisome for abscess. Small focus of subchondral edema in the lateral talus is likely degenerative. Electronically Signed By: Inge Rise M.D. On: 09/23/2019 15:25 Patient arrives in clinic today not complaining any of any pain. 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 She does not describe claudication however her activity is very limited. 10/01/2019; since the patient was last here I have communicated with Dr. Gwenlyn Found after bypass by Dr. Trula Slade and addressing the right superficial femoral artery he states that she is widely patent through peroneal arteries to the ankle with collaterals to the dorsalis pedis. He states he is going to talk to colleagues about the feasibility of tibial pedal access. The patient seems infectious disease later this afternoon Dr. Baxter Flattery. in preparation for this she has been off antibiotics for 1 week and I went ahead and obtain pieces of the remanence of her first metatarsal for pathology and CandS. The patient is a candidate for hyperbaric oxygen. 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 1/15; considerable improvement in the wounds on her feet. We are using silver collagen. She follows with Dr. Baxter Flattery of infectious disease and is on meropenem and daptomycin. She has been taught how to do this herself at home. Follows with Dr. Graylon Good at the end of treatment here. She has 2 wounds on the surgical TMA site 1 lateral and 1 medial the lateral 1 has regressed tremendously. The area medially still has some exposed bone although the  base of this looks healthy. 1/22; 2 separate wounds on the surgical TMA site. Both of these look satisfactory. The medial area does not have any exposed bone. This is an  improvement On the left side fourth toe dorsally over the proximal phalanx there is a deep punched out area that probes to bone. She has an area on the tip of the left third toe. She also tells [place] that in HBO she traumatized her left Achilles and this is left her with a superficial wound area 1/28; weekly visit along with HBO. She has 5 wounds. T our punched out areas on the original TMA site on the right. Both of these appear to have contracted. o The area on the right no longer has exposed bone. She has an area on the tip of the left third toe and on the DIP of the left fourth toe. Both of these had surface callus that I removed and unfortunately they have small areas that both go to bone. She has a traumatic wound on the left Achilles area that happened in HBO and that appears better. She is tolerating her IV antibiotics well at home. She has home health changing the dressings and she is doing it once on the weekends. We have been using silver collagen. She has been extensively revascularized on the right by Dr. Trula Slade and subsequently by Dr. Gwenlyn Found. 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 Baxter Flattery of infectious disease on 2/17. Dr. Gwenlyn Found on 2/23 and according to her her antibiotics stopped on 2/23. She is tolerating hyperbarics well. She has made a tremendous improvement in the right forefoot with only 2 smaller open wounds. Using silver collagen. On the left foot she has the area on the tip of the left third toe and the medial part of the left fourth toe. These had exposed bone last week I did not sense any of that today 2/12; sees Dr. Graylon Good on 2/17 and Dr. Gwenlyn Found on 2/23. We are going to use  Dermagraft on this today however the lateral part of her train TMA incision on the right is healed and the medial part is down so much that we just continue with the silver collagen She has wounds on the tip of her left third and the medial aspect of the left fourth. Both of these still have exposed bone I have not been able to get these to epithelialize. 2/19; she sees Dr. Gwenlyn Found on 2/23 and I have communicated with him about the left vascular supply. Looking at her angiogram from 08/03/2019 it looks as though that they could not cross the left CFA. Noted that she had one-vessel runoff bilaterally. She also apparently saw Dr. Graylon Good although I did not look up these results for preparation of this record. 2/26; Dr. Gwenlyn Found is going to do an angiogram next week on Wednesday. I think they are going to try to go at this both retrograde and anterograde to see if anything can be done to revascularize the left lower limb. She continues to make nice progress on the remaining wound on the right medial foot on her TMA site and the toe it wounds on the left are responding nicely as well 3/12; the patient underwent an extensive and complicated revascularization on the left leg by Dr. Fletcher Anon on 3/3; she had an atherectomy on occluded left popliteal artery; anterior tibial artery followed by a balloon angioplasty. Atherectomy was also performed to the left SFA because there was still significant 50% stenosis in the left popliteal artery they performed an intravascular  lithotripsy which improved the residual stenosis to 20%. The same lithotripsy was used to dilate the proximal left SFA. She had a drug-eluting stent placed I believe in the SFA. The patient returned for hyperbarics this week. She has had some eye problems on the right which she tells me are secondary to diabetic retinopathy and she saw her eye doctor. She is really made excellent progress there is no open wounds on the left foot at all. She has one open  area medially and her TMA site on the right lateral wound is closed. 3/19; the patient comes in today with the area on the medial transmet on the right improved. Some debris removed from the surface revealed the still open wound. ooUnfortunately she now has the open area on the third toe tip and the medial aspect of the dorsal fourth toe. These are in the same location as her previous wounds. These had actually closed up last week. 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 She is undergone complex revascularizations on the right by Dr. Trula Slade and Dr. Gwenlyn Found and I believe Dr. 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 She completed IVantibiotics as directed by infectious disease and I believe a course of oral antibiotics as well.she underwent a complete 40 treatments of hyperbarics. She comes in today with the vast majority of the right TMA site healed there is a small opening on the most medial aspect. Her left third and left fourth toes appear to have epithelialization although this has fluctuated somewhat. 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 She had a gaping wound on the right transmetatarsal amputation L as well as deep wounds on the left third and left fourth toes. She underwent complex revascularizations on her bilateral lower legs which I described on her last visit. She completed IV antibiotics by infectious disease and 40 treatments of hyperbarics. Her left third and fourth toes are healed. The area on the right transmetatarsal amputation site is also closed today. READMISSION 08/04/2020 [name] is now a 81 year old woman that we had for a complex stay in our clinic from October  2000 14 January 2020. 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 Her current problems with wounds began on October 30 when she developed a spontaneous opening over the right transmet medial aspect over the first metatarsal head. She has been using collagen on this that she had leftover from her last stay in this clinic. She also has had an open area on the medial right calf from a vein harvest site from her CABG she. She said that this is never really closed since the vein harvest was done. With regards to her arterial status this is a major issue. She had an angiogram by Dr. Gwenlyn Found in September 2020. She developed a gangrenous right first and second toes. Ultimately she was referred to Dr. Trula Slade who did a right common femoral endarterectomy with patch angioplasty. This was on 07/21/2019 and she really had a good result. Dr. Gwenlyn Found did a atherectomy followed by a drug coated balloon angioplasty of  her right SFA popliteal and tibial peroneal trunk on 08/03/2019 again with excellent excellent results. Follow-up.Dopplers in November revealed revealed a widely patent SFA and tibioperoneal trunk. UNFORTUNATELY her recent angiogram that was done on 08/01/2020 showed an 80% right common femoral artery stenosis just above the previous endarterectomy site occluded right right SFA at the origin reconstituting the adductor canal by the profunda femoris collaterals. The profunda femoris is also diffusely diseased. There is and again another 80% tibioperoneal trunk stenosis and one-vessel runoff via the peroneal artery. She is seen Dr. Donzetta Matters and she is scheduled for a femoral endarterectomy and right femoral-popliteal bypass grafting with endarterectomy of her tibial peroneal trunk. The scheduled surgery is on 11/23 The patient does not complain of pain at rest. She does have  5-minute walking claudication. The wounds on her right first met head remanent was spontaneous she does not think she hit this. As mentioned in the area on her right medial calf is a vein harvest site Dr. Gwenlyn Found had texted me about getting her in the clinic and we have arranged this as soon as we can. I have told her that we will have to see how successful Dr. Donzetta Matters is before we know what can be done about the wounds in a precise fashion. Fortunately there does not seem to be any obvious infection involving the right foot although that may need to be looked at if things really deteriorate. She was treated with IV antibiotics and hyperbaric oxygen for her previous osteomyelitis 12/2; the patient underwent a right femoral endarterectomy and bypass femoral to popliteal artery. This was done on 08/16/2020. She is done remarkably well. She has a small area on the right anterior tibia and a small area in the medial part of her original right transmetatarsal amputation. She appears to have had an excellent response to surgery. 12/16; the patient's area on the right tibia and the small area on the medial part of her original right transmetatarsal amputation is totally healed READMISSION 06/15/2021 [name] is a now 81 year old woman who arrives for review of wounds on the left TMA site extending into her plantar foot as well as an area on the left posterior calcaneus. 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 She was extensively revascularized on the right at that time as noted above in our previous notes ended up receiving IV antibiotics and hyperbaric oxygen therapy. Miraculously this TMA site actually closed and she really has had not any trouble with this since. UNFORTUNATELY the same cannot be said of her left side. Her problem apparently started sometime in July when she hit her left third toe on a  barstool while playing pool. She developed an acute infection which eventually led to underlying osteomyelitis. She underwent an extensive hospitalization from 04/27/2021 through 05/15/2021. She received IV antibiotics. Also extensive intervention by Dr. Stanford Breed of vein and vascular which included a left common femoral to tibioperoneal trunk bypass as well as a left third toe amputation on 05/03/2021. Unfortunately the area did not heal she ended up requiring a left TMA. By her description this was left open. She has been using a wound VAC on this to earlier this week and then was switched to Santyl. I think because of the left forefoot off loader she is also developed an area on the left posterior calcaneus. She is not complaining of a lot of pain. She has her daughter at  home to change the dressings. She has been prescribed Santyl and she has a tube that she is brought into our clinic. I cannot get a lot of history of claudication although she is really not that mobile at this point walking with a walker. 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 She has had multiple interventions on the right side by vein and vascular and also by Dr. Gwenlyn Found as well as a transmetatarsal amputation on the right. She has had follow-up noninvasive studies on 06/06/2021; on the left that showed an ABI of 0.47 with monophasic waveforms a dorsalis pedis ABI of 0.40 with monophasic waveforms. Her ANGIOGRAM which was initially done by Dr. Oneida Alar on 04/28/2021 showed severe left foot superficial femoral artery occlusive disease and an occlusion of the left popliteal artery with two-vessel runoff to the left foot with moderate to severe disease below the knee popliteal artery and tibial disease. She had 80% distal right external iliac artery above the existing femoral above-knee popliteal bypass  but she does not have an open wound on the right foot She arrives in clinic with an extensive open area on the TMA site which in the mid aspect extends into the plantar foot. She also has a more superficial area on the upper part of her Achilles area of the left heel 9/29; patient arrives in clinic today with slough over the TMA site. We use MolecuLight to look at the surface of this suggesting cyan and white discoloration over a large part of this wound also extending into the third metatarsal area. She also has the area on the left posterior calcaneus. This is slough covered we will switch to [facility] here as well. There is some warmth in the area and some erythema we will give her antibiotics as well 10/6; completely necrotic surface once again on the left TMA site. We have been using Santyl and Hydrofera Blue. She has an area on the left posterior Achilles and actually states this has been more painful this week indeed there is some erythema around this area. She also describes I think claudication at rest which is relieved by dipping her left leg over the side of the bed at night. 10/13; our intake nurse was able to brush some slough off the surface of the left TMA hence this did not require debridement that is better than last week. The area heading towards the plantar part of her foot required an aggressive debridement to clean this up. This is quite a sizable divot but does not go to bone. The area on the right Achilles heel is also covered in a high 100% nonviable tissue we have been using Santyl and silver alginate 10/20; the area on the left TMA looks much better. Even the area is spreading into the midfoot looks improved. The right Achilles still is covered and nonviable surface 100%. We have been using Santyl and silver alginate and this will continue for this week The patient is approved for 5 Apligraf's and I think she is good enough to start using these next week 10/27; the patient's  wounds actually look quite good. We have been using Santyl and silver alginate. We applied Apligraf #1 today Saw Dr. Stanford Breed her vascular surgeon on 10/25 he told her TMA was healing nicely. She will follow-up with them in December to repeat noninvasive testing. 08/16/2021 upon evaluation today patient appears  to be doing much better in regard to her wounds. Both are showing signs of improvement which is great news. I am going to perform some debridement to clear away some of the necrotic debris currently this will just be a light debridement. Nonetheless other than that I feel like that the Apligraf is doing quite well working and reapply today and this will be #3 as far as the application is concerned. 12/20; since the patient was last here she was seen by Dr. Stanford Breed who felt that her Doppler flow was sluggish in the bypass. She therefore went underwent a urgent angiogram on 09/07/2021 by Dr. Carlis Abbott. She had an angioplasty of the distal anastomosis and the anterior tibial artery. She tolerated this well. She had no flow-limiting stenosis in the aortoiliac segment on the left. Some calcification in the left common iliac artery but it was not flow-limiting the common femoral and profunda were patent. The left lower extremity bypass had brisk flow with no significant proximal anastomosis on the common femoral artery. Distally there was a high-grade stenosis where the bypass anastomosis was performed. This was angioplastied as well as the anterior tibial artery. 12/28; the patient comes in early because of a wound over the tip of her right first metatarsal head. She is not really sure how this happened. Says she noticed it when she took her sock off on Friday before she got in the shower. She has been using Hydrofera Blue to this area. This is the original wounded site we dealt with in the setting of a previous right TMA. The patient has PA I researched this on [employer] link. As it turns out she has had  recent arterial studies noninvasive on 09/05/2021. On the right she had dampened monophasic waveforms at the popliteal absent waveforms at the PTA and dorsalis pedis. An ABI could not be obtained previously at 0.47. The comment states that they were unable to detect Doppler waveforms of the posterior tibial and dorsalis pedis arteries. 2023 09/26/2021; her original left TMA wound is almost closed. I reapplied Apligraf to the remaining area. The area on the plantar foot is also closed as far as I can tell HOWEVER she comes in with a second wound on the right first metatarsal head remanent both of these probe to bone. I did a bone scraping of this last week which only showed Corynebacterium unlikely to be a true pathogen. Nevertheless I am going to put her on Augmentin today probably for 2 weeks. X-ray will be done today. She has an appointment with Dr. Gwenlyn Saran at vein and vascular next Tuesday I will send him a note about my concerns about the right TM 1/10; we inadvertently remove the Apligraf from the left surgical site however everything looks closed here. She still has 2 probing wounds over the remanent of her first metatarsal head both go to bone although there is less erythema and swelling in the area. She does not have an appointment with Dr. Donzetta Matters until next Tuesday. I am going to go ahead and order an MRI of the right foot I had originally planned to have or at least consider HBO if the patient has underlying osteomyelitis in the right foot however she tells me that she lost a significant amount of her visual field during her last run with HBO in the right eye nasal aspect. She saw Dr. Zadie Rhine who is a retinal specialist I will see if I can look at his records. She is still on Augmentin as  finishes on Monday. I will see if she has had the MRI before considering additional antibiotics on Tuesday 10/10/2021; the patient's MRI is suggested osteomyelitis involving the first metatarsal stump from her  transmit there was no abscess. She has been on Augmentin with some improvement in the surrounding erythema although the small wound on the tip and the plantar aspect still probe easily to bone and there does not appear to be any viable tissue. UNFORTUNATELY arrives in clinic today with new areas on the right lateral foot neither 1 of these look particularly healthy. No debridement. The patient has an appoint with Dr. Donzetta Matters this morning my question is angiography of the right lego. I also wonder about an infectious disease appointment and I will continue her Augmentin but refer her for consideration. I did not get a culture that was positive even from a bone scraping however. 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 She saw her retina doctor Dr. Deloria Lair and her interpretation of this was that the hyperbarics may have contributed to a blockage. I wonder whether this was a central retinal vein or artery occlusion. I am not sure that Dr. Zadie Rhine documents in epic I will need to see if I can find his note 1/25; this is [name] returns to clinic having a very eventful week. Firstly she underwent angiography by Dr. Stanford Breed of vein and vascular.. Her preintervention ABI was 0. She underwent a right iliofemoral drug-coated balloon angioplasty, right popliteal drug-coated balloon angioplasty and a right tibial peroneal trunk and peroneal angioplasty by Dr. Stanford Breed. She tolerated this well. She definitely has better blood flow to the right foot. She saw Dr. Doren Custard in follow-up who felt her Doppler flow in the right foot was good. An x-ray did not show any evidence of osteomyelitis however I noted a previous MRI certainly did. She also saw infectious diseases been started on IV daptomycin and ertapenem.  She had the PICC line placed. I had started her on Augmentin on February 3 and that had some improvement in the erythema and swelling over the remanent of her first metatarsal head. As far as I am aware she did not have a specific organism cultured at least not by me although I did try. Everything on the left is closed including the left transmit amputation site and the area on her left posterior heel/Achilles. We are using silver collagen on the right foot now which has 2 open wounds on the right first metatarsal head and the right fifth metatarsal head 2/1; the patient's left foot transmit site and the area on her heel remain closed. Some eschar but I do not think this requires debridement She has areas on the right first and right fifth met heads underlying osteomyelitis she is on IV daptomycin and ertapenem. The areas are small but probe to bone. 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  Her right transmetatarsal amputation site appears less erythematous and less swollen however the wound on the first met head probes to bone. The bone itself does not feel that vibrant. The wounds on the fifth metatarsal head also on the right have eschar on the surface I did not consider debriding this today. 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 Dr. 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 We will therefore pursue hyperbaric oxygen therapy and treatment of the underlying refractory osteomyelitis 2/15; the patient's right foot actually looks a lot better 2 areas over the fifth met head are closed. 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 She is on IV antibiotics and has been approved for hyperbarics. In the meantime the whole right first met head remanent looks a lot better with antibiotics less swelling and less erythema but still with the open area probing to bone 2/22; saw Dr. Luan Pulling and then intravascular yesterday. She apparently had her arterial studies done. I have not reviewed these today however he stated he was well satisfied. She is still on IV antibiotics and tolerating hyperbaric oxygen well. Her largest wound is a small open area of the probes the bone over her right fifth metatarsal head remnant. She's been using silver collagen 11/22/2021: She continues on IV antibiotics but states that her last dose will be next Monday. She is tolerating hyperbaric oxygen therapy without difficulty. The wound over her first metatarsal head continues to contract. There is crusty nonviable tissue overlying this. This was debrided with a curette. 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 I removed this and was relieved that there is no open wound underneath. 12/06/2021: She is here today after her hyperbaric oxygen treatment. She continues to tolerate this well. The wound over her right first metatarsal head continues to, but still has a buildup of crusty nonviable tissue, similar to her prior visit. Her left TMA site had some dry skin across the scar area. Her home health nurse noticed a small amount of drainage coming from the site. I removed the dry skin and identified is a pinpoint opening  at the center of the amputation site with a clean base 12/20/2021: Today was her last hyperbaric oxygen treatment. She continues to have an opening over the right first metatarsal head that probes to bone. The left TMA opening that was identified at her last visit remains open. Both have good granulation tissue on the wound surfaces. 01/03/2022: At her last visit, we extended her hyperbaric oxygen therapy for another 20 treatments. The opening at the right first metatarsal head has closed. The left TMA opening has a tiny area that has not yet closed. We have been using Prisma and she has been wearing Darco forefoot offloading shoes. 01/17/2022: At her last visit, we closed out the right first metatarsal head site and the left TMA opening had a tiny area that was still not closed. Today, the TMA opening is closed and the right first metatarsal head has a pinpoint opening underneath some callus. She is due to complete her hyperbaric oxygen treatments this Friday. 01/31/2022:  Her wounds are closed. READMISSION 03/26/2022 [name] is well-known to our service. 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 Apparently she had a little bit of drainage starting to come at the site and when she removed her stocking, the wound opened. She denies any fevers or chills. No nausea or vomiting. She has been using some of the silver alginate that she had at home as a dressing. 04/02/2022: The culture that I took last week grew out organism sensitive to Augmentin which was prescribed. She has been taking this without difficulty. Today, there is no purulent drainage from her wound and it is nearly closed, under a layer of eschar. Patient History Information obtained from Patient. Family History Cancer - Mother, Diabetes - Maternal Grandparents, Hypertension - Mother, Stroke - Mother,Maternal Grandparents, No family history of Heart Disease, Hereditary Spherocytosis,  Kidney Disease, Lung Disease, Seizures, Thyroid Problems, Tuberculosis. Social History Former smoker, Marital Status - Widowed, Alcohol Use - Never, Drug Use - No History, Caffeine Use - Daily. 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, Peripheral Arterial Disease Denies history of Angina, Arrhythmia, Congestive Heart Failure, Hypotension, Peripheral Venous Disease, Phlebitis, Vasculitis Gastrointestinal Denies history of Cirrhosis , Colitis, Crohnoos, Hepatitis A, Hepatitis B, Hepatitis C Endocrine Patient has history of Type II Diabetes Denies history of Type I Diabetes Genitourinary Denies history of End Stage Renal Disease Immunological Denies history of Lupus Erythematosus, Raynaudoos, Scleroderma Integumentary (Skin) Denies history of History of Burn Musculoskeletal Denies history of Gout, Rheumatoid Arthritis, Osteoarthritis, Osteomyelitis Neurologic Denies history of Dementia, Neuropathy, Quadriplegia, Paraplegia, Seizure Disorder Oncologic Denies history of Received Chemotherapy, Received Radiation Psychiatric Denies history of Anorexia/bulimia, Confinement Anxiety Medical A Surgical History Notes nd Cardiovascular CABG x 5 Objective Constitutional Hypertensive, asymptomatic. No acute distress.. Vitals Time Taken: 9:33 AM, Weight: 145 lbs, Temperature: 98.0 F, Pulse: 78 bpm, Respiratory Rate: 18 breaths/min, Blood Pressure: 158/71  mmHg. Respiratory Normal work of breathing on room air.. General Notes: 04/02/2022: Today, there is no purulent drainage from her wound and it is nearly closed, under a layer of eschar. Integumentary (Hair, Skin) Wound #21 status is Open. Original cause of wound was Gradually Appeared. The date acquired was: 03/16/2022. The wound has been in treatment 1 weeks. 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 The wound measures 0.2cm length x 0.2cm width x 0.1cm depth; 0.031cm^2 area and 0.003cm^3 volume. There is Fat Layer (Subcutaneous Tissue) exposed. There is no tunneling or undermining noted. There is a medium amount of serosanguineous drainage noted. There is no granulation within the wound bed. There is a large (67-100%) amount of necrotic tissue within the wound bed including Eschar. Assessment Active Problems ICD-10 Type 2 diabetes mellitus with foot ulcer Non-pressure chronic ulcer of other part of right foot with fat layer exposed Procedures Wound #21 Pre-procedure diagnosis of Wound #21 is a Diabetic Wound/Ulcer of the Lower Extremity located on the Right Amputation Site - Transmetatarsal .Severity of Tissue Pre Debridement is: Fat layer exposed. There was a Selective/Open Wound Non-Viable Tissue Debridement with a total area of 0.16 sq cm performed by [name], MD. With the following instrument(s):  Curette to remove Non-Viable tissue/material. Material removed includes Eschar after achieving pain control using Lidocaine 4% Topical Solution. No specimens were taken. A time out was conducted at 09:40, prior to the start of the procedure. A Minimum amount of bleeding was controlled with Pressure. The procedure was tolerated well with a pain level of 0 throughout and a pain level of 0 following the procedure. Post Debridement Measurements: 0.2cm length x 0.2cm width x 0.1cm depth; 0.003cm^3 volume. Character of Wound/Ulcer Post Debridement is improved.  Severity of Tissue Post Debridement is: Fat layer exposed. Post procedure Diagnosis Wound #21: Same as Pre-Procedure Plan Follow-up Appointments: Return Appointment in 1 week. - Dr Celine Ahr Room 3 Monday July 17th at 1:15pm Edema Control - Lymphedema / SCD / Other: Elevate legs to the level of the heart or above for 30 minutes daily and/or when sitting, a frequency of: - throughout the day Avoid standing for long periods of time. Exercise regularly Moisturize legs daily. - Lotions :Aquaphor, Sween etc. WOUND #21: - Amputation Site - Transmetatarsal Wound Laterality: Right Cleanser: Soap and Water Every Other Day/30 Days Discharge Instructions: May shower and wash wound with dial antibacterial soap and water prior to dressing change. Cleanser: Wound Cleanser Every Other Day/30 Days Discharge Instructions: Cleanse the wound with wound cleanser prior to applying a clean dressing using gauze sponges, not tissue or cotton balls. Topical: Mupirocin Ointment Every Other Day/30 Days Discharge Instructions: Apply Mupirocin (Bactroban) as instructed Prim Dressing: KerraCel Ag Gelling Fiber Dressing, 2x2 in (silver alginate) Every Other Day/30 Days ary Discharge Instructions: Apply silver alginate to wound bed as instructed Secondary Dressing: ABD Pad, 5x9 Every Other Day/30 Days Discharge Instructions: Apply over primary dressing as directed. Secondary Dressing: Woven Gauze Sponges 2x2 in Every Other Day/30 Days Discharge Instructions: Apply over primary dressing as directed. Secured With: [employer], 4.5x3.1 (in/yd) Every Other Day/30 Days Discharge Instructions: Secure with Kerlix as directed. Secured With: Paper T ape, 2x10 (in/yd) Every Other Day/30 Days Discharge Instructions: Secure dressing with tape as directed. Secured With: Administrator, sports Dressing, Size 5, 12x25 (in/yd) Every Other Day/30 Days 04/02/2022: Today, there is no purulent drainage from her wound and it is nearly  closed, under a layer of eschar. I used a curette to debride the eschar from the wound site. We will continue using mupirocin with silver alginate. Follow-up in 1 week. Electronic Signature(s) Signed: 04/02/2022 10:09:05 AM By: [name] MD FACS Entered By: [name] on 04/02/2022 10:09:05 -------------------------------------------------------------------------------- HxROS Details Patient Name: Date of Service: [name], [place]. 04/02/2022 9:30 A M Medical Record Number: [id number] Patient Account Number: [id number] Date of Birth/Sex: Treating RN: 15-Apr-1941 (81 y.o. [name] Primary Care Provider: [name] Other Clinician: Referring Provider: Treating Provider/[name]: Mardi Mainland in Treatment: 1 Information Obtained From Patient Eyes Medical History: Negative for: Cataracts; Glaucoma; Optic Neuritis Ear/Nose/Mouth/Throat Medical History: Negative for: Chronic sinus problems/congestion; Middle ear problems Hematologic/Lymphatic Medical History: Negative for: Anemia; Hemophilia; Human Immunodeficiency Virus; Lymphedema; Sickle Cell Disease Respiratory Medical History: Negative for: Aspiration; Asthma; Chronic Obstructive Pulmonary Disease (COPD); Pneumothorax; Sleep Apnea; Tuberculosis Cardiovascular Medical History: Positive for: Coronary Artery Disease; Deep Vein Thrombosis; Hypertension; 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; Peripheral Arterial Disease Negative for: Angina; Arrhythmia; Congestive Heart Failure; Hypotension; Peripheral Venous Disease; Phlebitis; Vasculitis Past Medical History Notes: CABG x 5 Gastrointestinal Medical History: Negative for: Cirrhosis ; Colitis; Crohns; Hepatitis A; Hepatitis B; Hepatitis C Endocrine Medical History: Positive for: Type II Diabetes Negative for: Type  I Diabetes Time with diabetes: 40 Treated with: Oral agents Blood sugar tested every day: Yes Tested :  daily Genitourinary Medical History: Negative for: End Stage Renal Disease Immunological Medical History: Negative for: Lupus Erythematosus; Raynauds; Scleroderma Integumentary (Skin) Medical History: Negative for: History of Burn Musculoskeletal Medical History: Negative for: Gout; Rheumatoid Arthritis; Osteoarthritis; Osteomyelitis Neurologic Medical History: Negative for: Dementia; Neuropathy; Quadriplegia; Paraplegia; Seizure Disorder Oncologic Medical History: Negative for: Received Chemotherapy; Received Radiation Psychiatric Medical History: Negative for: Anorexia/bulimia; Confinement Anxiety Immunizations Pneumococcal Vaccine: Received Pneumococcal Vaccination: No Implantable Devices None Family and Social History Cancer: Yes - Mother; Diabetes: Yes - Maternal Grandparents; Heart Disease: No; Hereditary Spherocytosis: No; Hypertension: Yes - Mother; Kidney Disease: No; Lung Disease: No; Seizures: No; Stroke: Yes - Mother,Maternal Grandparents; Thyroid Problems: No; Tuberculosis: No; Former smoker; Marital Status - Widowed; Alcohol Use: Never; Drug Use: No History; Caffeine Use: Daily; Financial Concerns: No; Food, Clothing or Shelter Needs: No; Support System Lacking: No; Transportation Concerns: No Electronic Signature(s) Signed: 04/02/2022 11:59:17 AM By: Dellie Catholic RN Signed: 04/02/2022 12:30:27 PM By: [name] MD FACS Entered By: [name] on 04/02/2022 10:04:28 -------------------------------------------------------------------------------- SuperBill Details Patient Name: Date of Service: [name], [name] 04/02/2022 Medical Record Number: [id number] Patient Account Number: [id number] Date of Birth/Sex: Treating RN: 08-Sep-1941 (81 y.o. [name] Primary Care Provider: [name] Other Clinician: Referring Provider: Treating Provider/[name]: Mardi Mainland in Treatment: 1 Diagnosis  Coding ICD-10 Codes Code Description [phone] Type 2 diabetes mellitus with foot ulcer L97.512 Non-pressure chronic ulcer of other part of right foot with fat layer exposed Facility Procedures CPT4 Code: [id number] Description: [phone] - DEBRIDE WOUND 1ST 20 SQ CM OR < ICD-10 Diagnosis Description L97.512 Non-pressure chronic ulcer of other part of right foot with fat layer exposed Modifier: Quantity: 1 Physician Procedures : CPT4 Code Description Modifier [id number] - WC PHYS LEVEL 3 - EST PT 25 ICD-10 Diagnosis Description L97.512 Non-pressure chronic ulcer of other part of right foot with fat layer exposed E11.621 Type 2 diabetes mellitus with foot ulcer Quantity: 1 : [id number] - WC PHYS DEBR WO ANESTH 20 SQ CM ICD-10 Diagnosis Description L97.512 Non-pressure chronic ulcer of other part of right foot with fat layer exposed Quantity: 1 Electronic Signature(s) Signed: 04/02/2022 10:09:43 AM By: [name] MD FACS Entered By: [name] on 04/02/2022 10:09:42

## 2022-04-06 NOTE — Progress Notes (Signed)
[name], [name] ([id number]) Visit Report for 04/02/2022 Arrival Information Details Patient Name: Date of Service: [name] 04/02/2022 9:30 A M Medical Record Number: [id number] Patient Account Number: [id number] Date of Birth/Sex: Treating RN: January 08, 1941 (81 y.o. [name] Primary Care [name]: [name] Other Clinician: Referring [name]: Treating [name]/[name]: [name] in Treatment: 1 Visit Information History Since Last Visit Added or deleted any medications: No Patient Arrived: Ambulatory Any new allergies or adverse reactions: No Arrival Time: 09:31 Had a fall or experienced change in No Accompanied By: self activities of daily living that may affect Transfer Assistance: None risk of falls: Patient Has Alerts: Yes Signs or symptoms of abuse/neglect since last visito No Patient Alerts: ABI R 0.68 Hospitalized since last visit: No ABI L 0.75 Implantable device outside of the clinic excluding No cellular tissue based products placed in the center since last visit: Has Dressing in Place as Prescribed: Yes Pain Present Now: No Electronic Signature(s) Signed: 04/06/2022 12:33:08 PM By: [name] Creamer RN, BSN Entered By: [name] on 04/02/2022 09:32:00 -------------------------------------------------------------------------------- Encounter Discharge Information Details Patient Name: Date of Service: [name], [name]. 04/02/2022 9:30 A M Medical Record Number: [id number] Patient Account Number: [id number] Date of Birth/Sex: Treating RN: [birth year] (81 y.o. [name] Primary Care [name]: [name] Other Clinician: Referring [name]: Treating [name]/[name]: [name] in Treatment: 1 Encounter Discharge Information Items Post Procedure Vitals Discharge Condition: Stable Temperature (F): 98.0 Ambulatory Status: Ambulatory Pulse (bpm):  78 Discharge Destination: Home Respiratory Rate (breaths/min): 18 Transportation: Private Auto Blood Pressure (mmHg): 158/71 Accompanied By: self Schedule Follow-up Appointment: Yes Clinical Summary of Care: Patient Declined Electronic Signature(s) Signed: 04/06/2022 12:33:08 PM By: [name] Creamer RN, BSN Entered By: [name] on 04/02/2022 09:55:29 -------------------------------------------------------------------------------- Lower Extremity Assessment Details Patient Name: Date of Service: [name], [name]. 04/02/2022 9:30 A M Medical Record Number: [id number] Patient Account Number: [id number] Date of Birth/Sex: Treating RN: 03-Apr-1941 (81 y.o. [name] Primary Care [name]: [name] Other Clinician: Referring [name]: Treating [name]/[name]: [name] in Treatment: 1 Edema Assessment Assessed: [Left: No] [Right: No] E[Left: dema] [Right: :] Calf Left: Right: Point of Measurement: 30 cm From Medial Instep 30.5 cm Ankle Left: Right: Point of Measurement: 9 cm From Medial Instep 19 cm Vascular Assessment Pulses: Dorsalis Pedis Palpable: [Right:Yes] Electronic Signature(s) Signed: 04/06/2022 12:33:08 PM By: [name] Creamer RN, BSN Entered By: [name] on 04/02/2022 09:35:40 -------------------------------------------------------------------------------- Multi Wound Chart Details Patient Name: Date of Service: [name], [name]. 04/02/2022 9:30 A M Medical Record Number: [id number] Patient Account Number: [id number] Date of Birth/Sex: Treating RN: May 21, 1941 (81 y.o. [name] Primary Care [name]: [name] Other Clinician: Referring [name]: Treating [name]/[name]: [name] in Treatment: 1 Vital Signs Height(in): Pulse(bpm): 61 Weight(lbs): 145 Blood Pressure(mmHg): 158/71 Body Mass Index(BMI): Temperature(F): 98.0 Respiratory  Rate(breaths/min): 18 Photos: [21:Right Amputation Site -] [N/A:N/A N/A] Wound Location: [21:Transmetatarsal Gradually Appeared] [N/A:N/A] Wounding Event: [21:Diabetic Wound/Ulcer of the Lower] [N/A:N/A] Primary Etiology: [21:Extremity Coronary Artery Disease, Deep Vein N/A] Comorbid History: [21:Thrombosis, Hypertension, Myocardial Infarction, Peripheral Arterial Disease, Type II Diabetes 03/16/2022] [N/A:N/A] Date Acquired: [21:1] [N/A:N/A] Weeks of Treatment: [21:Open] [N/A:N/A] Wound Status: [21:No] [N/A:N/A] Wound Recurrence: [21:0.2x0.2x0.1] [N/A:N/A] Measurements L x W x D (cm) [21:0.031] [N/A:N/A] A (cm) : rea [21:0.003] [N/A:N/A] Volume (cm) : [21:34.00%] [N/A:N/A] % Reduction in A [21:rea: 40.00%] [N/A:N/A] % Reduction in Volume: [21:Grade 2] [N/A:N/A] Classification: [21:Medium] [  N/A:N/A] Exudate A mount: [21:Serosanguineous] [N/A:N/A] Exudate Type: [21:red, [name]] [N/A:N/A] Exudate Color: [21:None Present (0%)] [N/A:N/A] Granulation A mount: [21:Large (67-100%)] [N/A:N/A] Necrotic A mount: [21:Eschar] [N/A:N/A] Necrotic Tissue: [21:Fat Layer (Subcutaneous Tissue): Yes N/A] Exposed Structures: [21:Fascia: No Tendon: No Muscle: No Joint: No Bone: No Small (1-33%)] [N/A:N/A] Epithelialization: [21:Debridement - Selective/Open Wound N/A] Debridement: Pre-procedure Verification/Time Out 09:40 [N/A:N/A] Taken: [21:Lidocaine 4% Topical Solution] [N/A:N/A] Pain Control: [21:Necrotic/Eschar] [N/A:N/A] Tissue Debrided: [21:Non-Viable Tissue] [N/A:N/A] Level: [21:0.16] [N/A:N/A] Debridement A (sq cm): [21:rea Curette] [N/A:N/A] Instrument: [21:Minimum] [N/A:N/A] Bleeding: [21:Pressure] [N/A:N/A] Hemostasis A chieved: [21:0] [N/A:N/A] Procedural Pain: [21:0] [N/A:N/A] Post Procedural Pain: [21:Procedure was tolerated well] [N/A:N/A] Debridement Treatment Response: [21:0.2x0.2x0.1] [N/A:N/A] Post Debridement Measurements L x W x D (cm) [21:0.003] [N/A:N/A] Post Debridement  Volume: (cm) [21:Debridement] [N/A:N/A] Treatment Notes 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 Wound Cleanser Discharge Instruction: Cleanse the wound with wound cleanser prior to applying a clean dressing using gauze sponges, not tissue or cotton balls. Peri-Wound Care Topical Mupirocin Ointment Discharge Instruction: Apply Mupirocin (Bactroban) as instructed Primary Dressing KerraCel Ag Gelling Fiber Dressing, 2x2 in (silver alginate) Discharge Instruction: Apply silver alginate to wound bed as instructed Secondary Dressing ABD Pad, 5x9 Discharge Instruction: Apply over primary dressing as directed. Woven Gauze Sponges 2x2 in Discharge Instruction: Apply over primary dressing as directed. Secured With [employer], 4.5x3.1 (in/yd) Discharge Instruction: Secure with Kerlix as directed. Paper Tape, 2x10 (in/yd) Discharge Instruction: Secure dressing with tape as directed. Elastic Net Retention Dressing, Size 5, 12x25 (in/yd) Compression Wrap Compression Stockings Add-Ons Electronic Signature(s) Signed: 04/02/2022 10:02:42 AM By: [name] MD FACS Signed: 04/02/2022 11:59:17 AM By: Dellie Catholic RN Entered By: [name] on 04/02/2022 10:02:42 -------------------------------------------------------------------------------- Multi-Disciplinary Care Plan Details Patient Name: Date of Service: [name], [name]. 04/02/2022 9:30 A M Medical Record Number: [id number] Patient Account Number: [id number] Date of Birth/Sex: Treating RN: Aug 09, 1941 (81 y.o. [name] Primary Care Caldwell Kronenberger: [name] Other Clinician: Referring Jesusita Jocelyn: Treating Cheick Suhr/[name]: [name] in Treatment: 1 Active Inactive Wound/Skin Impairment Nursing  Diagnoses: Impaired tissue integrity Goals: Patient/caregiver will verbalize understanding of skin care regimen Date Initiated: 03/26/2022 Target Resolution Date: 05/23/2022 Goal Status: Active Interventions: Assess ulceration(s) every visit Treatment Activities: Skin care regimen initiated : 03/26/2022 Notes: Electronic Signature(s) Signed: 04/06/2022 12:33:08 PM By: [name] Creamer RN, BSN Entered By: [name] on 04/02/2022 09:40:16 -------------------------------------------------------------------------------- Pain Assessment Details Patient Name: Date of Service: [name], [name]. 04/02/2022 9:30 A M Medical Record Number: [id number] Patient Account Number: [id number] Date of Birth/Sex: Treating RN: 04/22/1941 (81 y.o. [name] Primary Care Lorn Butcher: [name] Other Clinician: Referring Jontavia Leatherbury: Treating Penelopi Mikrut/[name]: [name] in Treatment: 1 Active Problems Location of Pain Severity and Description of Pain Patient Has Paino No Site Locations Pain Management and Medication Current Pain Management: Electronic Signature(s) Signed: 04/06/2022 12:33:08 PM By: [name] Creamer RN, BSN Entered By: [name] on 04/02/2022 09:35:09 -------------------------------------------------------------------------------- Patient/Caregiver Education Details Patient Name: Date of Service: [name], [name] 7/10/2023andnbsp9:30 A M Medical Record Number: [id number] Patient Account Number: [id number] Date of Birth/Gender: Treating RN: 09/11/1941 (81 y.o. [name] Primary Care Physician: [name] Other Clinician: Referring Physician: Treating Physician/[name]: [name] in Treatment: 1 Education Assessment Education Provided To: Patient Education Topics Provided Wound/Skin Impairment: Methods: Explain/Verbal Responses: State content  correctly Electronic  Signature(s) Signed: 04/06/2022 12:33:08 PM By: [name] Creamer RN, BSN Entered By: [name] on 04/02/2022 09:40:37 -------------------------------------------------------------------------------- Wound Assessment Details Patient Name: Date of Service: [name], [name] 04/02/2022 9:30 A M Medical Record Number: [id number] Patient Account Number: [id number] Date of Birth/Sex: Treating RN: 08-Feb-1941 (81 y.o. 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 Wound Open Wounding Event: Gradually Appeared Status: Date Acquired: 03/16/2022 Comorbid Coronary Artery Disease, Deep Vein Thrombosis, Hypertension, Weeks Of Treatment: 1 History: Myocardial Infarction, Peripheral Arterial Disease, Type II Diabetes Clustered Wound: No Photos Wound Measurements Length: (cm) 0.2 Width: (cm) 0.2 Depth: (cm) 0.1 Area: (cm) 0.031 Volume: (cm) 0.003 % Reduction in Area: 34% % Reduction in Volume: 40% Epithelialization: Small (1-33%) Tunneling: No Undermining: No Wound Description Classification: Grade 2 Exudate Amount: Medium Exudate Type: Serosanguineous Exudate Color: red, [name] Foul Odor After Cleansing: No Slough/Fibrino Yes Wound Bed Granulation Amount: None Present (0%) Exposed Structure Necrotic Amount: Large (67-100%) Fascia Exposed: No Necrotic Quality: Eschar Fat Layer (Subcutaneous Tissue) Exposed: Yes Tendon Exposed: No Muscle Exposed: No Joint Exposed: No Bone Exposed: No Treatment Notes 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 Wound Cleanser Discharge Instruction: Cleanse the wound with wound cleanser prior to applying a clean dressing using gauze sponges, not tissue or cotton balls. Peri-Wound Care Topical Mupirocin Ointment Discharge Instruction: Apply Mupirocin (Bactroban) as instructed Primary Dressing KerraCel Ag Gelling Fiber Dressing, 2x2 in (silver alginate) Discharge Instruction: Apply silver alginate to wound bed as instructed Secondary Dressing ABD Pad, 5x9 Discharge Instruction: Apply over primary dressing as directed. Woven Gauze Sponges 2x2 in Discharge Instruction: Apply over primary dressing as directed. Secured With [employer], 4.5x3.1 (in/yd) Discharge Instruction: Secure with Kerlix as directed. Paper Tape, 2x10 (in/yd) Discharge Instruction: Secure dressing with tape as directed. Elastic Net Retention Dressing, Size 5, 12x25 (in/yd) Compression Wrap Compression Stockings Add-Ons Electronic Signature(s) Signed: 04/06/2022 12:33:08 PM By: [name] Creamer RN, BSN Entered By: [name] on 04/02/2022 09:39:26 -------------------------------------------------------------------------------- [place] Details Patient Name: Date of Service: [name], [name]. 04/02/2022 9:30 A M Medical Record Number: [id number] Patient Account Number: [id number] Date of Birth/Sex: Treating RN: 11/01/1940 (81 y.o. [name] Primary Care Jye Fariss: [name] Other Clinician: Referring Armine Rizzolo: Treating Abie Killian/[name]: [name] in Treatment: 1 Vital Signs Time Taken: 09:33 Temperature (F): 98.0 Weight (lbs): 145 Pulse (bpm): 78 Respiratory Rate (breaths/min): 18 Blood Pressure (mmHg): 158/71 Reference Range: 80 - 120 mg / dl Electronic Signature(s) Signed: 04/06/2022 12:33:08 PM By: [name] Creamer RN, BSN Entered By: [name] on 04/02/2022 09:34:58

## 2022-04-09 ENCOUNTER — Encounter (HOSPITAL_BASED_OUTPATIENT_CLINIC_OR_DEPARTMENT_OTHER): Payer: Medicare Other | Admitting: General Surgery

## 2022-04-09 DIAGNOSIS — E1151 Type 2 diabetes mellitus with diabetic peripheral angiopathy without gangrene: Secondary | ICD-10-CM | POA: Diagnosis not present

## 2022-04-09 DIAGNOSIS — Z09 Encounter for follow-up examination after completed treatment for conditions other than malignant neoplasm: Secondary | ICD-10-CM | POA: Diagnosis not present

## 2022-04-09 DIAGNOSIS — L97512 Non-pressure chronic ulcer of other part of right foot with fat layer exposed: Secondary | ICD-10-CM | POA: Diagnosis not present

## 2022-04-09 DIAGNOSIS — E11621 Type 2 diabetes mellitus with foot ulcer: Secondary | ICD-10-CM | POA: Diagnosis not present

## 2022-04-09 NOTE — Progress Notes (Signed)
[name], [name] ([id number]) Visit Report for 04/09/2022 Chief Complaint Document Details Patient Name: Date of Service: [name] 04/09/2022 1:15 PM Medical Record Number: [id number] Patient Account Number: [id number] Date of Birth/Sex: Treating RN: 1941/03/29 (81 y.o. [name] Primary Care Provider: [name] Other Clinician: Referring Provider: Treating Provider/[name]: Mardi Mainland in Treatment: 2 Information Obtained from: Patient Chief Complaint 07/13/2019; patient is here for review wounds on the toes of her right foot and the left fourth toe 08/04/2020; patient is here for review of wounds at the anterior part of her right first met head/TMA site and another area on the right medial calf at a vein harvest site. 03/26/2022: Reopening of right transmetatarsal scar at first metatarsal head. Electronic Signature(s) Signed: 04/09/2022 1:38:39 PM By: [name] MD FACS Entered By: [name] on 04/09/2022 13:38:39 -------------------------------------------------------------------------------- HPI Details Patient Name: Date of Service: [name], [name] LLY H. 04/09/2022 1:15 PM Medical Record Number: [id number] Patient Account Number: [id number] Date of Birth/Sex: Treating RN: May 13, 1941 (81 y.o. [name] Primary Care Provider: [name] Other Clinician: Referring Provider: Treating Provider/[name]: Mardi Mainland in Treatment: 2 History of Present Illness HPI Description: ADMISSION 07/13/2019 Patient is a 81 year old type II diabetic. She has known PAD. She has been followed by Dr. Jacqualyn Posey of podiatry for blistering areas on her toes dating back to 04/30/2019 which at this was the left fourth toe. By 8/11 she had wounds on the right and left second toes. She underwent arterial studies by Dr. Alvester Chou on 7/31 that showed ABIs on the right of 0.60 TBI of 0.26 on the left ABI of  0.56 and a TBI of 0.25. By 9/10 she had ischemic-looking wounds per Dr. Earleen Newport on the right first, left first second and third. She has been using Medihoney and then mupirocin more recently simply Betadine. The patient underwent angiography by Dr. Gwenlyn Found on 9/21. 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 Noted that there was 1 vessel runoff in the foot via the peroneal. On the left there was an 80% left SFA, 70% mid left SFA. There was a short segment calcified CTO distal less than SFA and above-knee popliteal artery reconstituting with two-vessel runoff. 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 It was felt that she had bilateral SFA disease as well as tibial vessel disease. An attempt was made to revascularize the left SFA but they were unable to cross because of the highly calcified nature of the lesion. 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 She has an area on the medial left fourth toe with raised horned callus on top of this. I am not certain what this represents. With regards to pain she has about a 2-1/2 I will claudication tolerance in the grocery store. She has some pain in night which is relieved by putting her feet down over the side of the bed. She is wearing Nitro-Dur patches on the top of her right foot. Past medical history includes type 2 diabetes with secondary PAD, neoplasm of the skin, diabetic retinopathy, carotid artery stenosis, hyperlipidemia hypertension. 11/2; the last time the patient was here I spoke to Dr. Gwenlyn Found about revascularization options on the right. As I understand things currently Dr. Gwenlyn Found  spoke with Dr. Trula Slade and ultimately the patient was taken to surgery on 10/27. She had a right iliofemoral endarterectomy with a  bovine patch angioplasty. I think the plan now is for her to have a staged intervention on the right SFA by Dr. Alvester Chou. Per the patient's understanding Dr. Gwenlyn Found and Dr. Jacqualyn Posey are waiting to see when the gangrenous toes on the right foot can be amputated. The patient states her pain is a lot better and she is grateful for that. 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 Small area on the left fourth toe. She is using Betadine to the mummified areas on the right and Medihoney on the left 08/27/2019; the last time I saw this patient I discharged her from the clinic. She had been revascularized by Dr. Trula Slade and she had a right iliofemoral endarterectomy with a bovine angioplasty. 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 Jacqualyn Posey of podiatry on 08/07/2019. I note that she also had a intervention by Dr. Gwenlyn Found and he performed a directional atherectomy and drug-coated balloon angioplasty of the SFA and popliteal artery on the right. I am not certain of the date of Dr. Kennon Holter procedure as of the time of this dictation. She was referred back to [place] by Dr. Earleen Newport predominantly for follow-up of the left fourth toe. She still has sutures and stitches in the right TMA site. She states her pain is a lot better. She expresses concern about the condition of the amputation site at the TMA. She is on doxycycline I think prescribed by Dr. Earleen Newport. She is complaining of some pain at night 12/10; I spoke to Dr. Jacqualyn Posey last week. He removed the sutures on the right foot on Monday of this week. She has the area on the left fourth toe just proximal to the PIP and then the right TMA site. She is still on doxycycline and has enough through next week. Unfortunately the TMA site does not look good at all. Both on the lateral and medial part of the incisions are areas that probe to bone. There is purulence over the medial part which I have  cultured. We will use silver alginate. Left fourth toe looks somewhat better but there was still exposed bone 12/17; patient has an MRI booked for 12/30. Culture I did last week showed Pseudomonas Serratia and Enterococcus. This was purulent drainage coming out of the medial part of her amputation site. I use cefdinir 300 twice daily for 10 days that started on 12/15. Her x-ray on the right showed limited evaluation for osteomyelitis. The findings could have been postoperative. There was subtle erosion in the distal first and distal fifth metatarsal. An MRI was suggested. On the left she had irregularity of the fourth middle and proximal phalanx consistent with a history of osteomyelitis. I do not know that she has a history of osteomyelitis in this area. She had a newly defined area on the plantar third toe 12/31; patient's MRI is listed below: MRI OF THE RIGHT FOREFOOT WITHOUT AND WITH CONTRAST TECHNIQUE: Multiplanar, multisequence MR imaging of the right forefoot was performed before and after the administration of intravenous contrast. CONTRAST 6 mL GADAVIST IV SOLN : COMPARISON: Plain films right foot 09/04/2019. FINDINGS: Bones/Joint/Cartilage The patient is status post transmetatarsal amputation as seen on the prior exam. Marrow edema and enhancement are seen in all of the distal metatarsals. In the first metatarsal, signal change extends 1.5 cm  proximal to the stump and in the second metatarsal extends approximately 2 cm proximal to the stump. Edema and enhancement are seen in only the distal 0.7 cm of the third metatarsal stump and tips of the fourth and fifth metatarsals. Bone marrow signal is otherwise unremarkable. A small focus of subchondral edema is seen in the lateral talus, likely degenerative. Ligaments Intact. Muscles and Tendons No intramuscular fluid collection. Soft tissues Skin ulceration is seen off the stump of the first metatarsal. A thin fluid collection  tracks deep to the wound and over the anterior metatarsals worrisome for small abscess. Intense subcutaneous edema and enhancement are seen diffusely. 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 Cellulitis about the foot. Skin ulceration over the distal first metatarsal is identified with a thin fluid collection tracking anteriorly along the stump worrisome for abscess. Small focus of subchondral edema in the lateral talus is likely degenerative. Electronically Signed By: Inge Rise M.D. On: 09/23/2019 15:25 Patient arrives in clinic today not complaining any of any pain. 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 She does not describe claudication however her activity is very limited. 10/01/2019; since the patient was last here I have communicated with Dr. Gwenlyn Found after bypass by Dr. Trula Slade and addressing the right superficial femoral artery he states that she is widely patent through peroneal arteries to the ankle with collaterals to the dorsalis pedis. He states he is going to talk to colleagues about the feasibility of tibial pedal access. The patient seems infectious disease later this afternoon Dr. Baxter Flattery. in preparation for this she has been off antibiotics for 1 week and I went ahead and obtain pieces of the remanence of her first metatarsal for pathology and CandS. The patient is a candidate for hyperbaric oxygen. 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 1/15; considerable improvement in the wounds on her feet. We are using silver collagen. She follows with Dr. Baxter Flattery of infectious disease and is on meropenem and daptomycin. She has been taught how to do this herself at home. Follows with Dr. Graylon Good at the end of  treatment here. She has 2 wounds on the surgical TMA site 1 lateral and 1 medial the lateral 1 has regressed tremendously. The area medially still has some exposed bone although the base of this looks healthy. 1/22; 2 separate wounds on the surgical TMA site. Both of these look satisfactory. The medial area does not have any exposed bone. This is an improvement On the left side fourth toe dorsally over the proximal phalanx there is a deep punched out area that probes to bone. She has an area on the tip of the left third toe. She also tells [place] that in HBO she traumatized her left Achilles and this is left her with a superficial wound area 1/28; weekly visit along with HBO. She has 5 wounds. T our punched out areas on the original TMA site on the right. Both of these appear to have contracted. o The area on the right no longer has exposed bone. She has an area on the tip of the left third toe and on the DIP of the left fourth toe. Both of these had surface callus that I removed and unfortunately they have small areas that both go to bone. She has a traumatic wound  on the left Achilles area that happened in HBO and that appears better. She is tolerating her IV antibiotics well at home. She has home health changing the dressings and she is doing it once on the weekends. We have been using silver collagen. She has been extensively revascularized on the right by Dr. Trula Slade and subsequently by Dr. Gwenlyn Found. 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 Baxter Flattery of infectious disease on 2/17. Dr. Gwenlyn Found on 2/23 and according to her her antibiotics stopped on 2/23. She is tolerating hyperbarics well. She has made a tremendous improvement in the right forefoot with only 2 smaller open wounds. Using silver collagen. On the left foot she has the area on the tip of the left third toe and  the medial part of the left fourth toe. These had exposed bone last week I did not sense any of that today 2/12; sees Dr. Graylon Good on 2/17 and Dr. Gwenlyn Found on 2/23. We are going to use Dermagraft on this today however the lateral part of her train TMA incision on the right is healed and the medial part is down so much that we just continue with the silver collagen She has wounds on the tip of her left third and the medial aspect of the left fourth. Both of these still have exposed bone I have not been able to get these to epithelialize. 2/19; she sees Dr. Gwenlyn Found on 2/23 and I have communicated with him about the left vascular supply. Looking at her angiogram from 08/03/2019 it looks as though that they could not cross the left CFA. Noted that she had one-vessel runoff bilaterally. She also apparently saw Dr. Graylon Good although I did not look up these results for preparation of this record. 2/26; Dr. Gwenlyn Found is going to do an angiogram next week on Wednesday. I think they are going to try to go at this both retrograde and anterograde to see if anything can be done to revascularize the left lower limb. She continues to make nice progress on the remaining wound on the right medial foot on her TMA site and the toe it wounds on the left are responding nicely as well 3/12; the patient underwent an extensive and complicated revascularization on the left leg by Dr. Fletcher Anon on 3/3; she had an atherectomy on occluded left popliteal artery; anterior tibial artery followed by a balloon angioplasty. Atherectomy was also performed to the left SFA because there was still significant 50% stenosis in the left popliteal artery they performed an intravascular lithotripsy which improved the residual stenosis to 20%. The same lithotripsy was used to dilate the proximal left SFA. She had a drug-eluting stent placed I believe in the SFA. The patient returned for hyperbarics this week. She has had some eye problems on the right which she  tells me are secondary to diabetic retinopathy and she saw her eye doctor. She is really made excellent progress there is no open wounds on the left foot at all. She has one open area medially and her TMA site on the right lateral wound is closed. 3/19; the patient comes in today with the area on the medial transmet on the right improved. Some debris removed from the surface revealed the still open wound. Unfortunately she now has the open area on the third toe tip and the medial aspect of the dorsal fourth  toe. These are in the same location as her previous wounds. These had actually closed up last week. 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 She is undergone complex revascularizations on the right by Dr. Trula Slade and Dr. Gwenlyn Found and I believe Dr. 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 She completed IVantibiotics as directed by infectious disease and I believe a course of oral antibiotics as well.she underwent a complete 40 treatments of hyperbarics. She comes in today with the vast majority of the right TMA site healed there is a small opening on the most medial aspect. Her left third and left fourth toes appear to have epithelialization although this has fluctuated somewhat. 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 She had a gaping wound on the right transmetatarsal amputation L as well as deep wounds on the left third and left fourth toes. She underwent complex revascularizations on her bilateral lower legs which I described on her last visit. She completed IV antibiotics by infectious disease and 40 treatments of hyperbarics. Her left third and fourth toes are healed. The area on the right  transmetatarsal amputation site is also closed today. READMISSION 08/04/2020 [name] is now a 81 year old woman that we had for a complex stay in our clinic from October 2000 14 January 2020. 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 Her current problems with wounds began on October 30 when she developed a spontaneous opening over the right transmet medial aspect over the first metatarsal head. She has been using collagen on this that she had leftover from her last stay in this clinic. She also has had an open area on the medial right calf from a vein harvest site from her CABG she. She said that this is never really closed since the vein harvest was done. With regards to her arterial status this is a major issue. She had an angiogram by Dr. Gwenlyn Found in September 2020. She developed a gangrenous right first and second toes. Ultimately she was referred to Dr. Trula Slade who did a right common femoral endarterectomy with patch angioplasty. This was on 07/21/2019 and she really had a good result. Dr. Gwenlyn Found did a atherectomy followed by a drug coated balloon angioplasty of her right SFA popliteal and tibial peroneal trunk on 08/03/2019 again with excellent excellent results. Follow-up.Dopplers in November revealed revealed a widely patent SFA and tibioperoneal trunk. UNFORTUNATELY her recent angiogram that was done on 08/01/2020 showed an 80% right common femoral artery stenosis just above the previous endarterectomy site occluded right right SFA at the origin reconstituting the adductor canal by the profunda femoris collaterals. The profunda femoris is also diffusely diseased. There is and again another 80% tibioperoneal trunk stenosis and one-vessel runoff via the peroneal artery. She is seen Dr. Donzetta Matters and she is scheduled for a femoral endarterectomy and right  femoral-popliteal bypass grafting with endarterectomy of her tibial peroneal trunk. The scheduled surgery is on 11/23 The patient does not complain of pain at rest. She does have 5-minute walking claudication. The wounds on her right first met head  remanent was spontaneous she does not think she hit this. As mentioned in the area on her right medial calf is a vein harvest site Dr. Gwenlyn Found had texted me about getting her in the clinic and we have arranged this as soon as we can. I have told her that we will have to see how successful Dr. Donzetta Matters is before we know what can be done about the wounds in a precise fashion. Fortunately there does not seem to be any obvious infection involving the right foot although that may need to be looked at if things really deteriorate. She was treated with IV antibiotics and hyperbaric oxygen for her previous osteomyelitis 12/2; the patient underwent a right femoral endarterectomy and bypass femoral to popliteal artery. This was done on 08/16/2020. She is done remarkably well. She has a small area on the right anterior tibia and a small area in the medial part of her original right transmetatarsal amputation. She appears to have had an excellent response to surgery. 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 Leppanen is a now 81 year old woman who arrives for review of wounds on the left TMA site extending into her plantar foot as well as an area on the left posterior calcaneus. 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 She was extensively revascularized on the right at that time as noted above in our previous notes ended up receiving IV antibiotics and hyperbaric oxygen therapy. Miraculously this TMA site actually closed and she really has had not any  trouble with this since. UNFORTUNATELY the same cannot be said of her left side. Her problem apparently started sometime in July when she hit her left third toe on a barstool while playing pool. She developed an acute infection which eventually led to underlying osteomyelitis. She underwent an extensive hospitalization from 04/27/2021 through 05/15/2021. She received IV antibiotics. Also extensive intervention by Dr. Stanford Breed of vein and vascular which included a left common femoral to tibioperoneal trunk bypass as well as a left third toe amputation on 05/03/2021. Unfortunately the area did not heal she ended up requiring a left TMA. By her description this was left open. She has been using a wound VAC on this to earlier this week and then was switched to Santyl. I think because of the left forefoot off loader she is also developed an area on the left posterior calcaneus. She is not complaining of a lot of pain. She has her daughter at home to change the dressings. She has been prescribed Santyl and she has a tube that she is brought into our clinic. I cannot get a lot of history of claudication although she is really not that mobile at this point walking with a walker. 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 She has had multiple interventions on the right side by vein and vascular and also by Dr. Gwenlyn Found as well as a transmetatarsal amputation on the right. She has had follow-up noninvasive studies on 06/06/2021; on the left that showed an ABI of 0.47 with monophasic waveforms a dorsalis pedis ABI of 0.40 with monophasic waveforms. Her ANGIOGRAM which was initially done by Dr. Oneida Alar on 04/28/2021 showed severe left  foot superficial femoral artery occlusive disease and an occlusion of the left popliteal artery with two-vessel runoff to the left foot with moderate to  severe disease below the knee popliteal artery and tibial disease. She had 80% distal right external iliac artery above the existing femoral above-knee popliteal bypass but she does not have an open wound on the right foot She arrives in clinic with an extensive open area on the TMA site which in the mid aspect extends into the plantar foot. She also has a more superficial area on the upper part of her Achilles area of the left heel 9/29; patient arrives in clinic today with slough over the TMA site. We use MolecuLight to look at the surface of this suggesting cyan and white discoloration over a large part of this wound also extending into the third metatarsal area. She also has the area on the left posterior calcaneus. This is slough covered we will switch to [facility] here as well. There is some warmth in the area and some erythema we will give her antibiotics as well 10/6; completely necrotic surface once again on the left TMA site. We have been using Santyl and Hydrofera Blue. She has an area on the left posterior Achilles and actually states this has been more painful this week indeed there is some erythema around this area. She also describes I think claudication at rest which is relieved by dipping her left leg over the side of the bed at night. 10/13; our intake nurse was able to brush some slough off the surface of the left TMA hence this did not require debridement that is better than last week. The area heading towards the plantar part of her foot required an aggressive debridement to clean this up. This is quite a sizable divot but does not go to bone. The area on the right Achilles heel is also covered in a high 100% nonviable tissue we have been using Santyl and silver alginate 10/20; the area on the left TMA looks much better. Even the area is spreading into the midfoot looks improved. The right Achilles still is covered and nonviable surface 100%. We have been using Santyl and silver  alginate and this will continue for this week The patient is approved for 5 Apligraf's and I think she is good enough to start using these next week 10/27; the patient's wounds actually look quite good. We have been using Santyl and silver alginate. We applied Apligraf #1 today Saw Dr. Stanford Breed her vascular surgeon on 10/25 he told her TMA was healing nicely. She will follow-up with them in December to repeat noninvasive testing. 08/16/2021 upon evaluation today patient appears to be doing much better in regard to her wounds. Both are showing signs of improvement which is great news. I am going to perform some debridement to clear away some of the necrotic debris currently this will just be a light debridement. Nonetheless other than that I feel like that the Apligraf is doing quite well working and reapply today and this will be #3 as far as the application is concerned. 12/20; since the patient was last here she was seen by Dr. Stanford Breed who felt that her Doppler flow was sluggish in the bypass. She therefore went underwent a urgent angiogram on 09/07/2021 by Dr. Carlis Abbott. She had an angioplasty of the distal anastomosis and the anterior tibial artery. She tolerated this well. She had no flow-limiting stenosis in the aortoiliac segment on the left. Some calcification in the left common  iliac artery but it was not flow-limiting the common femoral and profunda were patent. The left lower extremity bypass had brisk flow with no significant proximal anastomosis on the common femoral artery. Distally there was a high-grade stenosis where the bypass anastomosis was performed. This was angioplastied as well as the anterior tibial artery. 12/28; the patient comes in early because of a wound over the tip of her right first metatarsal head. She is not really sure how this happened. Says she noticed it when she took her sock off on Friday before she got in the shower. She has been using Hydrofera Blue to this  area. This is the original wounded site we dealt with in the setting of a previous right TMA. The patient has PA I researched this on [place] link. As it turns out she has had recent arterial studies noninvasive on 09/05/2021. On the right she had dampened monophasic waveforms at the popliteal absent waveforms at the PTA and dorsalis pedis. An ABI could not be obtained previously at 0.47. The comment states that they were unable to detect Doppler waveforms of the posterior tibial and dorsalis pedis arteries. 2023 09/26/2021; her original left TMA wound is almost closed. I reapplied Apligraf to the remaining area. The area on the plantar foot is also closed as far as I can tell HOWEVER she comes in with a second wound on the right first metatarsal head remanent both of these probe to bone. I did a bone scraping of this last week which only showed Corynebacterium unlikely to be a true pathogen. Nevertheless I am going to put her on Augmentin today probably for 2 weeks. X-ray will be done today. She has an appointment with Dr. Gwenlyn Saran at [facility] vascular next Tuesday I will send him a note about my concerns about the right TM 1/10; we inadvertently remove the Apligraf from the left surgical site however everything looks closed here. She still has 2 probing wounds over the remanent of her first metatarsal head both go to bone although there is less erythema and swelling in the area. She does not have an appointment with Dr. Donzetta Matters until next Tuesday. I am going to go ahead and order an MRI of the right foot I had originally planned to have or at least consider HBO if the patient has underlying osteomyelitis in the right foot however she tells me that she lost a significant amount of her visual field during her last run with HBO in the right eye nasal aspect. She saw Dr. Zadie Rhine who is a retinal specialist I will see if I can look at his records. She is still on Augmentin as finishes on Monday. I will see if  she has had the MRI before considering additional antibiotics on Tuesday 10/10/2021; the patient's MRI is suggested osteomyelitis involving the first metatarsal stump from her transmit there was no abscess. She has been on Augmentin with some improvement in the surrounding erythema although the small wound on the tip and the plantar aspect still probe easily to bone and there does not appear to be any viable tissue. UNFORTUNATELY arrives in clinic today with new areas on the right lateral foot neither 1 of these look particularly healthy. No debridement. The patient has an appoint with Dr. Donzetta Matters this morning my question is angiography of the right lego. I also wonder about an infectious disease appointment and I will continue her Augmentin but refer her for consideration. I did not get a culture that was positive  even from a bone scraping however. 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 She saw her retina doctor Dr. Deloria Lair and her interpretation of this was that the hyperbarics may have contributed to a blockage. I wonder whether this was a central retinal vein or artery occlusion. I am not sure that Dr. Zadie Rhine documents in epic I will need to see if I can find his note 1/25; this is [place] returns to clinic having a very eventful week. Firstly she underwent angiography by Dr. Stanford Breed of vein and vascular.. Her preintervention ABI was 0. She underwent a right iliofemoral drug-coated balloon angioplasty, right popliteal drug-coated balloon angioplasty and a right tibial peroneal trunk and peroneal angioplasty by Dr. Stanford Breed. She tolerated this well. She definitely has better blood flow to the right foot. She saw Dr. Doren Custard in follow-up who felt her Doppler flow in the right foot was good. An  x-ray did not show any evidence of osteomyelitis however I noted a previous MRI certainly did. She also saw infectious diseases been started on IV daptomycin and ertapenem. She had the PICC line placed. I had started her on Augmentin on February 3 and that had some improvement in the erythema and swelling over the remanent of her first metatarsal head. As far as I am aware she did not have a specific organism cultured at least not by me although I did try. Everything on the left is closed including the left transmit amputation site and the area on her left posterior heel/Achilles. We are using silver collagen on the right foot now which has 2 open wounds on the right first metatarsal head and the right fifth metatarsal head 2/1; the patient's left foot transmit site and the area on her heel remain closed. Some eschar but I do not think this requires debridement She has areas on the right first and right fifth met heads underlying osteomyelitis she is on IV daptomycin and ertapenem. The areas are small but probe to bone. 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 Her right transmetatarsal amputation site appears less erythematous and less swollen however the wound on the first met head probes to bone. The bone itself does not feel that vibrant. The wounds on the fifth metatarsal head also on the right have eschar on the surface I did not consider debriding this today. 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 Dr. 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 We will therefore pursue hyperbaric oxygen therapy and treatment of the underlying refractory osteomyelitis 2/15; the patient's right foot actually looks a lot better 2 areas over the fifth met head are  closed. 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 She is on IV antibiotics and has been approved for hyperbarics. In the meantime the whole right first met head remanent looks a lot better with antibiotics less swelling and less erythema but still with the open area probing to bone 2/22; saw Dr. Luan Pulling and then intravascular yesterday. She apparently had her arterial studies done. I have not reviewed these today however he stated he was well satisfied. She is still on IV antibiotics and tolerating hyperbaric oxygen well. Her largest wound is a small open area of the probes the bone over her right fifth metatarsal head remnant. She's been using silver collagen 11/22/2021: She continues on IV antibiotics but states that her last dose will be next Monday. She is tolerating hyperbaric oxygen therapy without difficulty. The wound over her first metatarsal head continues to contract. There is crusty nonviable tissue overlying this. This was debrided with a curette. 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 I removed this and was relieved that there is no open wound underneath. 12/06/2021: She is here today after her hyperbaric oxygen treatment. She continues to tolerate this well. The wound over her right first metatarsal head continues to, but still has a buildup of crusty nonviable tissue, similar to her prior visit. Her left TMA site had some  dry skin across the scar area. Her home health nurse noticed a small amount of drainage coming from the site. I removed the dry skin and identified is a pinpoint opening at the center of the amputation site with a clean base 12/20/2021: Today was her last hyperbaric oxygen treatment. She continues to have an opening over the right first metatarsal head that probes to bone. The left TMA opening that was identified at her last visit remains open. Both have good granulation tissue on the wound surfaces. 01/03/2022: At her last visit, we extended her hyperbaric oxygen therapy for another 20 treatments. The opening at the right first metatarsal head has closed. The left TMA opening has a tiny area that has not yet closed. We have been using Prisma and she has been wearing Darco forefoot offloading shoes. 01/17/2022: At her last visit, we closed out the right first metatarsal head site and the left TMA opening had a tiny area that was still not closed. Today, the TMA opening is closed and the right first metatarsal head has a pinpoint opening underneath some callus. She is due to complete her hyperbaric oxygen treatments this Friday. 01/31/2022: Her wounds are closed. READMISSION 03/26/2022 Ama Mcmaster is well-known to our service. 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 Apparently she had a little bit of drainage starting to come at the site and when she removed her stocking, the wound opened. She denies any fevers or chills. No nausea or vomiting. She has been using some of the silver alginate that she had at home as a dressing. 04/02/2022: The culture that I took last week grew out organism sensitive to Augmentin which was prescribed. She has been taking this without difficulty. Today, there is no purulent drainage from her wound and it is nearly closed, under a layer of eschar. 04/09/2022: She completed her course of Augmentin. Her wound has  healed. Electronic Signature(s) Signed: 04/09/2022 1:39:00 PM By: [name] MD FACS Entered By:  [name] on 04/09/2022 13:38:59 -------------------------------------------------------------------------------- Physical Exam Details Patient Name: Date of Service: [name] 04/09/2022 1:15 PM Medical Record Number: [id number] Patient Account Number: [id number] Date of Birth/Sex: Treating RN: [birth year] (81 y.o. [name] Primary Care Provider: [name] Other Clinician: Referring Provider: Treating Provider/[name]: Mardi Mainland in Treatment: 2 Constitutional . . . . No acute distress.Marland Kitchen Respiratory Normal work of breathing on room air.. Notes 04/09/2022: Her wound has healed. Electronic Signature(s) Signed: 04/09/2022 1:39:32 PM By: [name] MD FACS Entered By: [name] on 04/09/2022 13:39:32 -------------------------------------------------------------------------------- Physician Orders Details Patient Name: Date of Service: [name], [name]. 04/09/2022 1:15 PM Medical Record Number: [id number] Patient Account Number: [id number] Date of Birth/Sex: Treating RN: [birth year] (81 y.o. [name] Primary Care Provider: [name] Other Clinician: Referring Provider: Treating Provider/[name]: Mardi Mainland in Treatment: 2 Verbal / Phone Orders: No Diagnosis Coding ICD-10 Coding Code Description E11.621 Type 2 diabetes mellitus with foot ulcer L97.512 Non-pressure chronic ulcer of other part of right foot with fat layer exposed Discharge From [facility] Discharge from [place] your wound is healed! Electronic Signature(s) Signed: 04/09/2022 1:39:41 PM By: [name] MD FACS Entered By: [name] on 04/09/2022 13:39:41 -------------------------------------------------------------------------------- Problem List  Details Patient Name: Date of Service: [name], [name]. 04/09/2022 1:15 PM Medical Record Number: [id number] Patient Account Number: [id number] Date of Birth/Sex: Treating RN: 05-29-1941 (81 y.o. [name] Primary Care Provider: [name] Other Clinician: Referring Provider: Treating Provider/[name]: Mardi Mainland in Treatment: 2 Active Problems ICD-10 Encounter Code Description Active Date MDM Diagnosis E11.621 Type 2 diabetes mellitus with foot ulcer 03/26/2022 No Yes L97.512 Non-pressure chronic ulcer of other part of right foot with fat layer exposed 03/26/2022 No Yes Inactive Problems Resolved Problems Electronic Signature(s) Signed: 04/09/2022 1:38:26 PM By: [name] MD FACS Entered By: [name] on 04/09/2022 13:38:26 -------------------------------------------------------------------------------- Progress Note Details Patient Name: Date of Service: [name], [name]. 04/09/2022 1:15 PM Medical Record Number: [id number] Patient Account Number: [id number] Date of Birth/Sex: Treating RN: 08/26/1941 (81 y.o. [name] Primary Care Provider: [name] Other Clinician: Referring Provider: Treating Provider/[name]: Mardi Mainland in Treatment: 2 Subjective Chief Complaint Information obtained from Patient 07/13/2019; patient is here for review wounds on the toes of her right foot and the left fourth toe 08/04/2020; patient is here for review of wounds at the anterior part of her right first met head/TMA site and another area on the right medial calf at a vein harvest site. 03/26/2022: Reopening of right transmetatarsal scar at first metatarsal head. History of Present Illness (HPI) ADMISSION 07/13/2019 Patient is a 81 year old type II diabetic. She has known PAD. She has been followed by Dr. Jacqualyn Posey of podiatry for blistering areas on her toes dating back to 04/30/2019  which at this was the left fourth toe. By 8/11 she had wounds on the right and left second toes. She underwent arterial studies by Dr. Alvester Chou on 7/31 that showed ABIs on the right of 0.60 TBI of 0.26 on the left ABI of 0.56 and a TBI of 0.25. By 9/10 she had ischemic-looking wounds per Dr. Earleen Newport on the right first, left first second and third. She has been using Medihoney and then mupirocin more recently simply Betadine. The patient underwent angiography by Dr. Gwenlyn Found on 9/21. On the right this showed 90 to 95% calcified distal right common  femoral artery stenosis and a 95% focal mid SFA stenosis followed by an 80% segmental stenosis. Noted that there was 1 vessel runoff in the foot via the peroneal. ooOn the left there was an 80% left SFA, 70% mid left SFA. There was a short segment calcified CTO distal less than SFA and above-knee popliteal artery reconstituting with two-vessel runoff. 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 It was felt that she had bilateral SFA disease as well as tibial vessel disease. An attempt was made to revascularize the left SFA but they were unable to cross because of the highly calcified nature of the lesion. 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 She has an area on the medial left fourth toe with raised horned callus on top of this. I am not certain what this represents. With regards to pain she has about a 2-1/2 I will claudication tolerance in the grocery store. She has some pain in night which is relieved by putting her feet down over the side of the bed. She is wearing Nitro-Dur patches on the top of her right foot. Past medical history includes type 2 diabetes with secondary PAD, neoplasm of the skin, diabetic retinopathy, carotid artery stenosis, hyperlipidemia hypertension. 11/2; the last time the patient was here I spoke to Dr. Gwenlyn Found  about revascularization options on the right. As I understand things currently Dr. Gwenlyn Found spoke with Dr. Trula Slade and ultimately the patient was taken to surgery on 10/27. She had a right iliofemoral endarterectomy with a bovine patch angioplasty. I think the plan now is for her to have a staged intervention on the right SFA by Dr. Alvester Chou. Per the patient's understanding Dr. Gwenlyn Found and Dr. Jacqualyn Posey are waiting to see when the gangrenous toes on the right foot can be amputated. The patient states her pain is a lot better and she is grateful for that. 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 Small area on the left fourth toe. She is using Betadine to the mummified areas on the right and Medihoney on the left 08/27/2019; the last time I saw this patient I discharged her from the clinic. She had been revascularized by Dr. Trula Slade and she had a right iliofemoral endarterectomy with a bovine angioplasty. 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 Jacqualyn Posey of podiatry on 08/07/2019. I note that she also had a intervention by Dr. Gwenlyn Found and he performed a directional atherectomy and drug-coated balloon angioplasty of the SFA and popliteal artery on the right. I am not certain of the date of Dr. Kennon Holter procedure as of the time of this dictation. She was referred back to [place] by Dr. Earleen Newport predominantly for follow-up of the left fourth toe. She still has sutures and stitches in the right TMA site. She states her pain is a lot better. She expresses concern about the condition of the amputation site at the TMA. She is on doxycycline I think prescribed by Dr. Earleen Newport. She is complaining of some pain at night 12/10; I spoke to Dr. Jacqualyn Posey last week. He removed the sutures on the right foot on Monday of this week. She has the area on the left fourth toe just proximal to the PIP and then the right TMA site. She is still on doxycycline and has  enough through next week. Unfortunately  the TMA site does not look good at all. Both on the lateral and medial part of the incisions are areas that probe to bone. There is purulence over the medial part which I have cultured. We will use silver alginate. Left fourth toe looks somewhat better but there was still exposed bone 12/17; patient has an MRI booked for 12/30. Culture I did last week showed Pseudomonas Serratia and Enterococcus. This was purulent drainage coming out of the medial part of her amputation site. I use cefdinir 300 twice daily for 10 days that started on 12/15. Her x-ray on the right showed limited evaluation for osteomyelitis. The findings could have been postoperative. There was subtle erosion in the distal first and distal fifth metatarsal. An MRI was suggested. On the left she had irregularity of the fourth middle and proximal phalanx consistent with a history of osteomyelitis. I do not know that she has a history of osteomyelitis in this area. She had a newly defined area on the plantar third toe 12/31; patient's MRI is listed below: MRI OF THE RIGHT FOREFOOT WITHOUT AND WITH CONTRAST TECHNIQUE: Multiplanar, multisequence MR imaging of the right forefoot was performed before and after the administration of intravenous contrast. CONTRAST 6 mL GADAVIST IV SOLN : COMPARISON: Plain films right foot 09/04/2019. FINDINGS: Bones/Joint/Cartilage The patient is status post transmetatarsal amputation as seen on the prior exam. Marrow edema and enhancement are seen in all of the distal metatarsals. In the first metatarsal, signal change extends 1.5 cm proximal to the stump and in the second metatarsal extends approximately 2 cm proximal to the stump. Edema and enhancement are seen in only the distal 0.7 cm of the third metatarsal stump and tips of the fourth and fifth metatarsals. Bone marrow signal is otherwise unremarkable. A small focus of subchondral edema is seen in the  lateral talus, likely degenerative. Ligaments Intact. Muscles and Tendons No intramuscular fluid collection. Soft tissues Skin ulceration is seen off the stump of the first metatarsal. A thin fluid collection tracks deep to the wound and over the anterior metatarsals worrisome for small abscess. Intense subcutaneous edema and enhancement are seen diffusely. 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 Cellulitis about the foot. Skin ulceration over the distal first metatarsal is identified with a thin fluid collection tracking anteriorly along the stump worrisome for abscess. Small focus of subchondral edema in the lateral talus is likely degenerative. Electronically Signed By: Inge Rise M.D. On: 09/23/2019 15:25 Patient arrives in clinic today not complaining any of any pain. 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 She does not describe claudication however her activity is very limited. 10/01/2019; since the patient was last here I have communicated with Dr. Gwenlyn Found after bypass by Dr. Trula Slade and addressing the right superficial femoral artery he states that she is widely patent through peroneal arteries to the ankle with collaterals to the dorsalis pedis. He states he is going to talk to colleagues about the feasibility of tibial pedal access. The patient seems infectious disease later this afternoon Dr. Baxter Flattery. in preparation for this she has been off antibiotics for 1 week and I went ahead and obtain pieces of the remanence of her first metatarsal for pathology and CandS. The patient is a candidate for hyperbaric oxygen. 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 1/15; considerable improvement in the wounds on her  feet. We are using silver collagen. She follows with Dr. Baxter Flattery of infectious disease and is on meropenem and daptomycin. She has been taught how to do this herself at home. Follows with Dr. Graylon Good at the end of treatment here. She has 2 wounds on the surgical TMA site 1 lateral and 1 medial the lateral 1 has regressed tremendously. The area medially still has some exposed bone although the base of this looks healthy. 1/22; 2 separate wounds on the surgical TMA site. Both of these look satisfactory. The medial area does not have any exposed bone. This is an improvement On the left side fourth toe dorsally over the proximal phalanx there is a deep punched out area that probes to bone. She has an area on the tip of the left third toe. She also tells [place] that in HBO she traumatized her left Achilles and this is left her with a superficial wound area 1/28; weekly visit along with HBO. She has 5 wounds. T our punched out areas on the original TMA site on the right. Both of these appear to have contracted. o The area on the right no longer has exposed bone. She has an area on the tip of the left third toe and on the DIP of the left fourth toe. Both of these had surface callus that I removed and unfortunately they have small areas that both go to bone. She has a traumatic wound on the left Achilles area that happened in HBO and that appears better. She is tolerating her IV antibiotics well at home. She has home health changing the dressings and she is doing it once on the weekends. We have been using silver collagen. She has been extensively revascularized on the right by Dr. Trula Slade and subsequently by Dr. Gwenlyn Found. 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 Baxter Flattery of infectious disease on 2/17. Dr. Gwenlyn Found on 2/23 and according to her her antibiotics stopped on 2/23. She is  tolerating hyperbarics well. She has made a tremendous improvement in the right forefoot with only 2 smaller open wounds. Using silver collagen. On the left foot she has the area on the tip of the left third toe and the medial part of the left fourth toe. These had exposed bone last week I did not sense any of that today 2/12; sees Dr. Graylon Good on 2/17 and Dr. Gwenlyn Found on 2/23. We are going to use Dermagraft on this today however the lateral part of her train TMA incision on the right is healed and the medial part is down so much that we just continue with the silver collagen She has wounds on the tip of her left third and the medial aspect of the left fourth. Both of these still have exposed bone I have not been able to get these to epithelialize. 2/19; she sees Dr. Gwenlyn Found on 2/23 and I have communicated with him about the left vascular supply. Looking at her angiogram from 08/03/2019 it looks as though that they could not cross the left CFA. Noted that she had one-vessel runoff bilaterally. She also apparently saw Dr. Graylon Good although I did not look up these results for preparation of this record. 2/26; Dr. Gwenlyn Found is going to do an angiogram next week on Wednesday. I think they are going to  try to go at this both retrograde and anterograde to see if anything can be done to revascularize the left lower limb. She continues to make nice progress on the remaining wound on the right medial foot on her TMA site and the toe it wounds on the left are responding nicely as well 3/12; the patient underwent an extensive and complicated revascularization on the left leg by Dr. Fletcher Anon on 3/3; she had an atherectomy on occluded left popliteal artery; anterior tibial artery followed by a balloon angioplasty. Atherectomy was also performed to the left SFA because there was still significant 50% stenosis in the left popliteal artery they performed an intravascular lithotripsy which improved the residual stenosis to 20%. The  same lithotripsy was used to dilate the proximal left SFA. She had a drug-eluting stent placed I believe in the SFA. The patient returned for hyperbarics this week. She has had some eye problems on the right which she tells me are secondary to diabetic retinopathy and she saw her eye doctor. She is really made excellent progress there is no open wounds on the left foot at all. She has one open area medially and her TMA site on the right lateral wound is closed. 3/19; the patient comes in today with the area on the medial transmet on the right improved. Some debris removed from the surface revealed the still open wound. ooUnfortunately she now has the open area on the third toe tip and the medial aspect of the dorsal fourth toe. These are in the same location as her previous wounds. These had actually closed up last week. 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 She is undergone complex revascularizations on the right by Dr. Trula Slade and Dr. Gwenlyn Found and I believe Dr. 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 She completed IVantibiotics as directed by infectious disease and I believe a course of oral antibiotics as well.she underwent a complete 40 treatments of hyperbarics. She comes in today with the vast majority of the right TMA site healed there is a small opening on the most medial aspect. Her left third and left fourth toes appear to have epithelialization although this has fluctuated somewhat. 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 She had a gaping wound on the right transmetatarsal amputation L as well as deep wounds on the left third and left fourth toes. She underwent complex  revascularizations on her bilateral lower legs which I described on her last visit. She completed IV antibiotics by infectious disease and 40 treatments of hyperbarics. Her left third and fourth toes are healed. The area on the right transmetatarsal amputation site is also closed today. READMISSION 08/04/2020 [name] is now a 81 year old woman that we had for a complex stay in our clinic from October 2000 14 January 2020. 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 Her current problems with wounds began on October 30 when she developed a spontaneous opening over the right transmet medial aspect over the first metatarsal head. She has been using collagen on this that she had leftover  from her last stay in this clinic. She also has had an open area on the medial right calf from a vein harvest site from her CABG she. She said that this is never really closed since the vein harvest was done. With regards to her arterial status this is a major issue. She had an angiogram by Dr. Gwenlyn Found in September 2020. She developed a gangrenous right first and second toes. Ultimately she was referred to Dr. Trula Slade who did a right common femoral endarterectomy with patch angioplasty. This was on 07/21/2019 and she really had a good result. Dr. Gwenlyn Found did a atherectomy followed by a drug coated balloon angioplasty of her right SFA popliteal and tibial peroneal trunk on 08/03/2019 again with excellent excellent results. Follow-up.Dopplers in November revealed revealed a widely patent SFA and tibioperoneal trunk. UNFORTUNATELY her recent angiogram that was done on 08/01/2020 showed an 80% right common femoral artery stenosis just above the previous endarterectomy site occluded right right SFA at the origin reconstituting the adductor canal by the profunda femoris collaterals. The  profunda femoris is also diffusely diseased. There is and again another 80% tibioperoneal trunk stenosis and one-vessel runoff via the peroneal artery. She is seen Dr. Donzetta Matters and she is scheduled for a femoral endarterectomy and right femoral-popliteal bypass grafting with endarterectomy of her tibial peroneal trunk. The scheduled surgery is on 11/23 The patient does not complain of pain at rest. She does have 5-minute walking claudication. The wounds on her right first met head remanent was spontaneous she does not think she hit this. As mentioned in the area on her right medial calf is a vein harvest site Dr. Gwenlyn Found had texted me about getting her in the clinic and we have arranged this as soon as we can. I have told her that we will have to see how successful Dr. Donzetta Matters is before we know what can be done about the wounds in a precise fashion. Fortunately there does not seem to be any obvious infection involving the right foot although that may need to be looked at if things really deteriorate. She was treated with IV antibiotics and hyperbaric oxygen for her previous osteomyelitis 12/2; the patient underwent a right femoral endarterectomy and bypass femoral to popliteal artery. This was done on 08/16/2020. She is done remarkably well. She has a small area on the right anterior tibia and a small area in the medial part of her original right transmetatarsal amputation. She appears to have had an excellent response to surgery. 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 Gianino is a now 81 year old woman who arrives for review of wounds on the left TMA site extending into her plantar foot as well as an area on the left posterior calcaneus. 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  She was extensively revascularized on the right at that time as noted above in our previous notes ended up receiving IV antibiotics and hyperbaric oxygen therapy. Miraculously this TMA site actually closed and she really has had not any trouble with this since. UNFORTUNATELY the same cannot be said of her left side. Her problem apparently started sometime in July when she hit her left third toe on a barstool while playing pool. She developed an acute infection which eventually led to underlying osteomyelitis. She underwent an  extensive hospitalization from 04/27/2021 through 05/15/2021. She received IV antibiotics. Also extensive intervention by Dr. Stanford Breed of vein and vascular which included a left common femoral to tibioperoneal trunk bypass as well as a left third toe amputation on 05/03/2021. Unfortunately the area did not heal she ended up requiring a left TMA. By her description this was left open. She has been using a wound VAC on this to earlier this week and then was switched to Santyl. I think because of the left forefoot off loader she is also developed an area on the left posterior calcaneus. She is not complaining of a lot of pain. She has her daughter at home to change the dressings. She has been prescribed Santyl and she has a tube that she is brought into our clinic. I cannot get a lot of history of claudication although she is really not that mobile at this point walking with a walker. 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 She has had multiple interventions on the right side by vein and vascular and also by Dr. Gwenlyn Found as well as a transmetatarsal amputation on the right. She has had follow-up noninvasive studies on 06/06/2021; on the left that showed an ABI of 0.47 with monophasic waveforms a dorsalis pedis ABI of 0.40 with monophasic waveforms.  Her ANGIOGRAM which was initially done by Dr. Oneida Alar on 04/28/2021 showed severe left foot superficial femoral artery occlusive disease and an occlusion of the left popliteal artery with two-vessel runoff to the left foot with moderate to severe disease below the knee popliteal artery and tibial disease. She had 80% distal right external iliac artery above the existing femoral above-knee popliteal bypass but she does not have an open wound on the right foot She arrives in clinic with an extensive open area on the TMA site which in the mid aspect extends into the plantar foot. She also has a more superficial area on the upper part of her Achilles area of the left heel 9/29; patient arrives in clinic today with slough over the TMA site. We use MolecuLight to look at the surface of this suggesting cyan and white discoloration over a large part of this wound also extending into the third metatarsal area. She also has the area on the left posterior calcaneus. This is slough covered we will switch to [facility] here as well. There is some warmth in the area and some erythema we will give her antibiotics as well 10/6; completely necrotic surface once again on the left TMA site. We have been using Santyl and Hydrofera Blue. She has an area on the left posterior Achilles and actually states this has been more painful this week indeed there is some erythema around this area. She also describes I think claudication at rest which is relieved by dipping her left leg over the side of the bed at night. 10/13; our intake nurse was able to brush some slough off the surface of the left TMA hence this did not require debridement that is better than last week. The area heading towards the plantar part of her foot required an aggressive debridement to clean this up. This is quite a sizable divot but does not go to bone. The area on the right Achilles heel is also covered in a high 100% nonviable tissue we have been using Santyl  and silver alginate 10/20; the area on the  left TMA looks much better. Even the area is spreading into the midfoot looks improved. The right Achilles still is covered and nonviable surface 100%. We have been using Santyl and silver alginate and this will continue for this week The patient is approved for 5 Apligraf's and I think she is good enough to start using these next week 10/27; the patient's wounds actually look quite good. We have been using Santyl and silver alginate. We applied Apligraf #1 today Saw Dr. Stanford Breed her vascular surgeon on 10/25 he told her TMA was healing nicely. She will follow-up with them in December to repeat noninvasive testing. 08/16/2021 upon evaluation today patient appears to be doing much better in regard to her wounds. Both are showing signs of improvement which is great news. I am going to perform some debridement to clear away some of the necrotic debris currently this will just be a light debridement. Nonetheless other than that I feel like that the Apligraf is doing quite well working and reapply today and this will be #3 as far as the application is concerned. 12/20; since the patient was last here she was seen by Dr. Stanford Breed who felt that her Doppler flow was sluggish in the bypass. She therefore went underwent a urgent angiogram on 09/07/2021 by Dr. Carlis Abbott. She had an angioplasty of the distal anastomosis and the anterior tibial artery. She tolerated this well. She had no flow-limiting stenosis in the aortoiliac segment on the left. Some calcification in the left common iliac artery but it was not flow-limiting the common femoral and profunda were patent. The left lower extremity bypass had brisk flow with no significant proximal anastomosis on the common femoral artery. Distally there was a high-grade stenosis where the bypass anastomosis was performed. This was angioplastied as well as the anterior tibial artery. 12/28; the patient comes in early because of a  wound over the tip of her right first metatarsal head. She is not really sure how this happened. Says she noticed it when she took her sock off on Friday before she got in the shower. She has been using Hydrofera Blue to this area. This is the original wounded site we dealt with in the setting of a previous right TMA. The patient has PA I researched this on [employer] link. As it turns out she has had recent arterial studies noninvasive on 09/05/2021. On the right she had dampened monophasic waveforms at the popliteal absent waveforms at the PTA and dorsalis pedis. An ABI could not be obtained previously at 0.47. The comment states that they were unable to detect Doppler waveforms of the posterior tibial and dorsalis pedis arteries. 2023 09/26/2021; her original left TMA wound is almost closed. I reapplied Apligraf to the remaining area. The area on the plantar foot is also closed as far as I can tell HOWEVER she comes in with a second wound on the right first metatarsal head remanent both of these probe to bone. I did a bone scraping of this last week which only showed Corynebacterium unlikely to be a true pathogen. Nevertheless I am going to put her on Augmentin today probably for 2 weeks. X-ray will be done today. She has an appointment with Dr. Gwenlyn Saran at vein and vascular next Tuesday I will send him a note about my concerns about the right TM 1/10; we inadvertently remove the Apligraf from the left surgical site however everything looks closed here. She still has 2 probing wounds over the remanent of her first metatarsal  head both go to bone although there is less erythema and swelling in the area. She does not have an appointment with Dr. Donzetta Matters until next Tuesday. I am going to go ahead and order an MRI of the right foot I had originally planned to have or at least consider HBO if the patient has underlying osteomyelitis in the right foot however she tells me that she lost a significant amount of  her visual field during her last run with HBO in the right eye nasal aspect. She saw Dr. Zadie Rhine who is a retinal specialist I will see if I can look at his records. She is still on Augmentin as finishes on Monday. 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 She has been on Augmentin with some improvement in the surrounding erythema although the small wound on the tip and the plantar aspect still probe easily to bone and there does not appear to be any viable tissue. UNFORTUNATELY arrives in clinic today with new areas on the right lateral foot neither 1 of these look particularly healthy. No debridement. The patient has an appoint with Dr. Donzetta Matters this morning my question is angiography of the right lego. I also wonder about an infectious disease appointment and I will continue her Augmentin but refer her for consideration. I did not get a culture that was positive even from a bone scraping however. 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 She saw her retina doctor Dr. Deloria Lair and her interpretation of this was that the hyperbarics may have contributed to a blockage. I wonder whether this was a central retinal vein or artery occlusion. I am not sure that Dr. Zadie Rhine documents in epic I will need to see if I can find his note 1/25; this is [place] returns to clinic having a very eventful week. Firstly she underwent angiography by Dr. Stanford Breed of vein and vascular.. Her preintervention ABI was 0. She underwent a right iliofemoral drug-coated balloon angioplasty, right popliteal drug-coated balloon angioplasty and a right  tibial peroneal trunk and peroneal angioplasty by Dr. Stanford Breed. She tolerated this well. She definitely has better blood flow to the right foot. She saw Dr. Doren Custard in follow-up who felt her Doppler flow in the right foot was good. An x-ray did not show any evidence of osteomyelitis however I noted a previous MRI certainly did. She also saw infectious diseases been started on IV daptomycin and ertapenem. She had the PICC line placed. I had started her on Augmentin on February 3 and that had some improvement in the erythema and swelling over the remanent of her first metatarsal head. As far as I am aware she did not have a specific organism cultured at least not by me although I did try. Everything on the left is closed including the left transmit amputation site and the area on her left posterior heel/Achilles. We are using silver collagen on the right foot now which has 2 open wounds on the right first metatarsal head and the right fifth metatarsal head 2/1; the patient's left foot transmit site and the area on her heel remain closed. Some eschar  but I do not think this requires debridement She has areas on the right first and right fifth met heads underlying osteomyelitis she is on IV daptomycin and ertapenem. The areas are small but probe to bone. 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 Her right transmetatarsal amputation site appears less erythematous and less swollen however the wound on the first met head probes to bone. The bone itself does not feel that vibrant. The wounds on the fifth metatarsal  head also on the right have eschar on the surface I did not consider debriding this today. 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 Dr. 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 We will therefore pursue hyperbaric oxygen therapy and treatment of the underlying refractory osteomyelitis 2/15; the patient's right foot actually looks a lot better 2 areas over the fifth met head are closed. 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 She is on IV antibiotics and has been approved for hyperbarics. In the meantime the whole right first met head remanent looks a lot better with antibiotics less swelling and less erythema but still with the open area probing to bone 2/22; saw Dr. Luan Pulling and then intravascular yesterday. She apparently had her arterial studies done. I have not reviewed these today however he stated he was well satisfied. She is still on IV antibiotics and tolerating hyperbaric oxygen well. Her largest wound is a small open area of the probes the bone over her right fifth metatarsal head remnant. She's been using silver collagen 11/22/2021: She continues on IV antibiotics but states that her last dose will be next Monday. She is tolerating hyperbaric oxygen therapy without difficulty. The wound over her first metatarsal head continues to contract. There is crusty nonviable tissue overlying this. This was debrided with a curette. 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 I removed this and was relieved that there is no open wound underneath. 12/06/2021: She is here today after her  hyperbaric oxygen treatment. She continues to tolerate this well. The wound over her right first metatarsal head continues to, but still has a buildup of crusty nonviable tissue, similar to her prior visit. Her left TMA site had some dry skin across the scar area. Her home health nurse noticed a small amount of drainage coming from the site. I removed the dry skin and identified is a pinpoint opening at the center of the amputation site with a clean base 12/20/2021: Today was her last hyperbaric oxygen treatment. She continues to have an opening over the right first metatarsal head that probes to bone. The left TMA opening that was identified at her last visit remains open. Both have good granulation tissue on the  wound surfaces. 01/03/2022: At her last visit, we extended her hyperbaric oxygen therapy for another 20 treatments. The opening at the right first metatarsal head has closed. The left TMA opening has a tiny area that has not yet closed. We have been using Prisma and she has been wearing Darco forefoot offloading shoes. 01/17/2022: At her last visit, we closed out the right first metatarsal head site and the left TMA opening had a tiny area that was still not closed. Today, the TMA opening is closed and the right first metatarsal head has a pinpoint opening underneath some callus. She is due to complete her hyperbaric oxygen treatments this Friday. 01/31/2022: Her wounds are closed. READMISSION 03/26/2022 [name] is well-known to our service. 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 Apparently she had a little bit of drainage starting to come at the site and when she removed her stocking, the wound opened. She denies any fevers or chills. No nausea or vomiting. She has been using some of the silver alginate that she had at home as a dressing. 04/02/2022: The culture that I took last week grew out organism sensitive to Augmentin which was  prescribed. She has been taking this without difficulty. Today, there is no purulent drainage from her wound and it is nearly closed, under a layer of eschar. 04/09/2022: She completed her course of Augmentin. Her wound has healed. Patient History Information obtained from Patient. Family History Cancer - Mother, Diabetes - Maternal Grandparents, Hypertension - Mother, Stroke - Mother,Maternal Grandparents, No family history of Heart Disease, Hereditary Spherocytosis, Kidney Disease, Lung Disease, Seizures, Thyroid Problems, Tuberculosis. Social History Former smoker, Marital Status - Widowed, Alcohol Use - Never, Drug Use - No History, Caffeine Use - Daily. 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, Peripheral Arterial Disease Denies history of Angina, Arrhythmia, Congestive Heart Failure, Hypotension, Peripheral Venous Disease, Phlebitis, Vasculitis Gastrointestinal Denies history of Cirrhosis , Colitis, Crohnoos, Hepatitis A, Hepatitis B, Hepatitis C Endocrine Patient has history of Type II Diabetes Denies history of Type I Diabetes Genitourinary Denies history of End Stage Renal Disease Immunological Denies history of Lupus Erythematosus, Raynaudoos, Scleroderma Integumentary (Skin) Denies history of History of Burn Musculoskeletal Denies history of Gout, Rheumatoid Arthritis, Osteoarthritis, Osteomyelitis Neurologic Denies history of Dementia, Neuropathy,  Quadriplegia, Paraplegia, Seizure Disorder Oncologic Denies history of Received Chemotherapy, Received Radiation Psychiatric Denies history of Anorexia/bulimia, Confinement Anxiety Medical A Surgical History Notes nd Cardiovascular CABG x 5 Objective Constitutional No acute distress.. Vitals Time Taken: 1:15 PM, Weight: 145 lbs, Temperature: 97.8 F, Pulse: 75 bpm, Respiratory Rate: 16 breaths/min, Blood Pressure: 122/72 mmHg. Respiratory Normal work of breathing on room air.. General Notes: 04/09/2022: Her wound has healed. Integumentary (Hair, Skin) Wound #21 status is Healed - Epithelialized. Original cause of wound was Gradually Appeared. The date acquired was: 03/16/2022. The wound has been in treatment 2 weeks. 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 The wound measures 0cm length x 0cm width x 0cm depth; 0cm^2 area and 0cm^3 volume. There is no tunneling or undermining noted. There is a none present amount of drainage noted. There is no  granulation within the wound bed. There is no necrotic tissue within the wound bed. Assessment Active Problems ICD-10 Type 2 diabetes mellitus with foot ulcer Non-pressure chronic ulcer of other part of right foot with fat layer exposed Plan Discharge From [facility] Services: Discharge from [place] your wound is healed! 04/09/2022: Her wound has healed. She will wear her orthotic inserts and continue to pad the transmetatarsal amputation scar to try and minimize the risk of further future breakdowns. Follow-up as needed. Electronic Signature(s) Signed: 04/09/2022 1:40:50 PM By: [name] MD FACS Entered By: [name] on 04/09/2022 13:40:50 -------------------------------------------------------------------------------- HxROS Details Patient Name: Date of Service: [name], [name] LLY H. 04/09/2022 1:15 PM Medical Record Number: [id number] Patient Account Number: [id number] Date of  Birth/Sex: Treating RN: 12/05/1940 (81 y.o. [name] Primary Care Provider: [name] Other Clinician: Referring Provider: Treating Provider/[name]: Mardi Mainland in Treatment: 2 Information Obtained From Patient Eyes Medical History: Negative for: Cataracts; Glaucoma; Optic Neuritis Ear/Nose/Mouth/Throat Medical History: Negative for: Chronic sinus problems/congestion; Middle ear problems Hematologic/Lymphatic Medical History: Negative for: Anemia; Hemophilia; Human Immunodeficiency Virus; Lymphedema; Sickle Cell Disease Respiratory Medical History: Negative for: Aspiration; Asthma; Chronic Obstructive Pulmonary Disease (COPD); Pneumothorax; Sleep Apnea; Tuberculosis Cardiovascular Medical History: Positive for: Coronary Artery Disease; Deep Vein Thrombosis; Hypertension; 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; Peripheral Arterial Disease Negative for: Angina; Arrhythmia; Congestive Heart Failure; Hypotension; Peripheral Venous Disease; Phlebitis; Vasculitis Past Medical History Notes: CABG x 5 Gastrointestinal Medical History: Negative for: Cirrhosis ; Colitis; Crohns; Hepatitis A; Hepatitis B; Hepatitis C Endocrine Medical History: Positive for: Type II Diabetes Negative for: Type I Diabetes Time with diabetes: 45 Treated with: Oral agents Blood sugar tested every day: Yes Tested : daily Genitourinary Medical History: Negative for: End Stage Renal Disease Immunological Medical History: Negative for: Lupus Erythematosus; Raynauds; Scleroderma Integumentary (Skin) Medical History: Negative for: History of Burn Musculoskeletal Medical History: Negative for: Gout; Rheumatoid Arthritis; Osteoarthritis; Osteomyelitis Neurologic Medical History: Negative for: Dementia; Neuropathy; Quadriplegia; Paraplegia; Seizure Disorder Oncologic Medical History: Negative for: Received Chemotherapy; Received  Radiation Psychiatric Medical History: Negative for: Anorexia/bulimia; Confinement Anxiety Immunizations Pneumococcal Vaccine: Received Pneumococcal Vaccination: No Implantable Devices None Family and Social History Cancer: Yes - Mother; Diabetes: Yes - Maternal Grandparents; Heart Disease: No; Hereditary Spherocytosis: No; Hypertension: Yes - Mother; Kidney Disease: No; Lung Disease: No; Seizures: No; Stroke: Yes - Mother,Maternal Grandparents; Thyroid Problems: No; Tuberculosis: No; Former smoker; Marital Status - Widowed; Alcohol Use: Never; Drug Use: No History; Caffeine Use: Daily; Financial Concerns: No; Food, Clothing or Shelter Needs: No; Support System Lacking: No; Transportation Concerns: No Electronic Signature(s) Signed: 04/09/2022 1:46:14 PM By: Dellie Catholic RN Signed: 04/09/2022 3:35:15 PM By: [name] MD FACS Entered By: [name] on 04/09/2022 13:39:05 -------------------------------------------------------------------------------- SuperBill Details Patient Name: Date of Service: [name], Rodey. 04/09/2022 Medical Record Number: [id number] Patient Account Number: [id number] Date of Birth/Sex: Treating RN: 19-Dec-1940 (81 y.o. [name] Primary Care Provider: [name] Other Clinician: Referring Provider: Treating Provider/[name]: Mardi Mainland in Treatment: 2 Diagnosis Coding ICD-10 Codes Code Description 4131236025 Type 2 diabetes mellitus with foot ulcer L97.512 Non-pressure chronic ulcer of other part of right foot with fat layer exposed Facility Procedures CPT4 Code: 94496759 Description: [place] VISIT-LEV 3 EST PT Modifier: Quantity: 1 Physician Procedures Electronic Signature(s) Signed: 04/09/2022 1:46:14 PM By: Dellie Catholic RN Signed: 04/09/2022 3:35:15 PM By: [name] MD FACS Previous Signature: 04/09/2022 1:41:02 PM Version By: [name]  MD FACS Entered By:  Dellie Catholic on 04/09/2022 13:44:46

## 2022-04-09 NOTE — Progress Notes (Signed)
[name], [name] ([id number]) Visit Report for 04/09/2022 Arrival Information Details Patient Name: Date of Service: [name] 04/09/2022 1:15 PM Medical Record Number: [id number] Patient Account Number: [id number] Date of Birth/Sex: Treating RN: Jan 18, 1941 (81 y.o. [name] Primary Care Luisfernando Brightwell: [name] Other Clinician: Referring Tavionna Grout: Treating Mehgan Santmyer/[name]: Mardi Mainland in Treatment: 2 Visit Information History Since Last Visit Added or deleted any medications: No Patient Arrived: Ambulatory Any new allergies or adverse reactions: No Arrival Time: 13:10 Had a fall or experienced change in No Accompanied By: self activities of daily living that may affect Transfer Assistance: None risk of falls: Patient Identification Verified: Yes Signs or symptoms of abuse/neglect since last visito No Patient Has Alerts: Yes Hospitalized since last visit: No Patient Alerts: ABI R 0.68 Implantable device outside of the clinic excluding No ABI L 0.75 cellular tissue based products placed in the center since last visit: Has Dressing in Place as Prescribed: Yes Pain Present Now: No Electronic Signature(s) Signed: 04/09/2022 1:46:14 PM By: Dellie Catholic RN Entered By: Dellie Catholic on 04/09/2022 13:11:18 -------------------------------------------------------------------------------- Clinic Level of Care Assessment Details Patient Name: Date of Service: [name] 04/09/2022 1:15 PM Medical Record Number: [id number] Patient Account Number: [id number] Date of Birth/Sex: Treating RN: Nov 30, 1940 (81 y.o. [name] Primary Care Delwin Raczkowski: [name] Other Clinician: Referring Rubee Vega: Treating Arella Blinder/[name]: Mardi Mainland in Treatment: 2 Clinic Level of Care Assessment Items TOOL 4 Quantity Score X- 1 0 Use when only an EandM is performed on FOLLOW-UP  visit ASSESSMENTS - Nursing Assessment / Reassessment X- 1 10 Reassessment of Co-morbidities (includes updates in patient status) X- 1 5 Reassessment of Adherence to Treatment Plan ASSESSMENTS - Wound and Skin A ssessment / Reassessment X - Simple Wound Assessment / Reassessment - one wound 1 5 '[]'$  - 0 Complex Wound Assessment / Reassessment - multiple wounds '[]'$  - 0 Dermatologic / Skin Assessment (not related to wound area) ASSESSMENTS - Focused Assessment '[]'$  - 0 Circumferential Edema Measurements - multi extremities '[]'$  - 0 Nutritional Assessment / Counseling / Intervention '[]'$  - 0 Lower Extremity Assessment (monofilament, tuning fork, pulses) '[]'$  - 0 Peripheral Arterial Disease Assessment (using hand held doppler) ASSESSMENTS - Ostomy and/or Continence Assessment and Care '[]'$  - 0 Incontinence Assessment and Management '[]'$  - 0 Ostomy Care Assessment and Management (repouching, etc.) PROCESS - Coordination of Care X - Simple Patient / Family Education for ongoing care 1 15 '[]'$  - 0 Complex (extensive) Patient / Family Education for ongoing care X- 1 10 Staff obtains Programmer, systems, Records, T Results / Process Orders est X- 1 10 Staff telephones HHA, Nursing Homes / Clarify orders / etc '[]'$  - 0 Routine Transfer to another Facility (non-emergent condition) '[]'$  - 0 Routine Hospital Admission (non-emergent condition) '[]'$  - 0 New Admissions / Biomedical engineer / Ordering NPWT Apligraf, etc. , '[]'$  - 0 Emergency Hospital Admission (emergent condition) X- 1 10 Simple Discharge Coordination '[]'$  - 0 Complex (extensive) Discharge Coordination PROCESS - Special Needs '[]'$  - 0 Pediatric / Minor Patient Management '[]'$  - 0 Isolation Patient Management '[]'$  - 0 Hearing / Language / Visual special needs '[]'$  - 0 Assessment of Community assistance (transportation, D/C planning, etc.) '[]'$  - 0 Additional assistance / Altered mentation '[]'$  - 0 Support Surface(s) Assessment (bed, cushion, seat,  etc.) INTERVENTIONS - Wound Cleansing / Measurement X - Simple Wound Cleansing - one wound 1 5 '[]'$  - 0 Complex Wound Cleansing - multiple wounds X-  1 5 Wound Imaging (photographs - any number of wounds) '[]'$  - 0 Wound Tracing (instead of photographs) X- 1 5 Simple Wound Measurement - one wound '[]'$  - 0 Complex Wound Measurement - multiple wounds INTERVENTIONS - Wound Dressings '[]'$  - 0 Small Wound Dressing one or multiple wounds '[]'$  - 0 Medium Wound Dressing one or multiple wounds '[]'$  - 0 Large Wound Dressing one or multiple wounds '[]'$  - 0 Application of Medications - topical '[]'$  - 0 Application of Medications - injection INTERVENTIONS - Miscellaneous '[]'$  - 0 External ear exam '[]'$  - 0 Specimen Collection (cultures, biopsies, blood, body fluids, etc.) '[]'$  - 0 Specimen(s) / Culture(s) sent or taken to Lab for analysis '[]'$  - 0 Patient Transfer (multiple staff / Civil Service fast streamer / Similar devices) '[]'$  - 0 Simple Staple / Suture removal (25 or less) '[]'$  - 0 Complex Staple / Suture removal (26 or more) '[]'$  - 0 Hypo / Hyperglycemic Management (close monitor of Blood Glucose) '[]'$  - 0 Ankle / Brachial Index (ABI) - do not check if billed separately X- 1 5 Vital Signs Has the patient been seen at the hospital within the last three years: Yes Total Score: 85 Level Of Care: New/Established - Level 3 Electronic Signature(s) Signed: 04/09/2022 1:46:14 PM By: Dellie Catholic RN Entered By: Dellie Catholic on 04/09/2022 13:44:34 -------------------------------------------------------------------------------- Encounter Discharge Information Details Patient Name: Date of Service: [name]. 04/09/2022 1:15 PM Medical Record Number: [id number] Patient Account Number: [id number] Date of Birth/Sex: Treating RN: [birth year] (81 y.o. [name] Primary Care Kasha Howeth: [name] Other Clinician: Referring Sayid Moll: Treating Anila Bojarski/[name]: Mardi Mainland  in Treatment: 2 Encounter Discharge Information Items Discharge Condition: Stable Ambulatory Status: Ambulatory Discharge Destination: Home Transportation: Private Auto Accompanied By: self Schedule Follow-up Appointment: Yes Clinical Summary of Care: Patient Declined Electronic Signature(s) Signed: 04/09/2022 1:46:14 PM By: Dellie Catholic RN Entered By: Dellie Catholic on 04/09/2022 13:45:19 -------------------------------------------------------------------------------- Lower Extremity Assessment Details Patient Name: Date of Service: [name] 04/09/2022 1:15 PM Medical Record Number: [id number] Patient Account Number: [id number] Date of Birth/Sex: Treating RN: Aug 02, 1941 (81 y.o. [name] Primary Care Yamina Lenis: [name] Other Clinician: Referring Fatumata Kashani: Treating Tajai Suder/[name]: Mardi Mainland in Treatment: 2 Edema Assessment Assessed: [Left: No] [Right: No] [Left: Edema] [Right: :] Calf Left: Right: Point of Measurement: 30 cm From Medial Instep 30.5 cm Ankle Left: Right: Point of Measurement: 9 cm From Medial Instep 19 cm Electronic Signature(s) Signed: 04/09/2022 1:46:14 PM By: Dellie Catholic RN Entered By: Dellie Catholic on 04/09/2022 13:21:35 -------------------------------------------------------------------------------- Multi Wound Chart Details Patient Name: Date of Service: [name]. 04/09/2022 1:15 PM Medical Record Number: [id number] Patient Account Number: [id number] Date of Birth/Sex: Treating RN: 10/30/1940 (81 y.o. [name] Primary Care Ana Woodroof: [name] Other Clinician: Referring Zade Falkner: Treating Aneudy Champlain/[name]: Mardi Mainland in Treatment: 2 Vital Signs Height(in): Pulse(bpm): 90 Weight(lbs): 145 Blood Pressure(mmHg): 122/72 Body Mass Index(BMI): Temperature(F): 97.8 Respiratory Rate(breaths/min): 16 Photos:  [N/A:N/A] Right Amputation Site - N/A N/A Wound Location: Transmetatarsal Gradually Appeared N/A N/A Wounding Event: Diabetic Wound/Ulcer of the Lower N/A N/A Primary Etiology: Extremity Coronary Artery Disease, Deep Vein N/A N/A Comorbid History: Thrombosis, Hypertension, Myocardial Infarction, Peripheral Arterial Disease, Type II Diabetes 03/16/2022 N/A N/A Date Acquired: 2 N/A N/A Weeks of Treatment: Healed - Epithelialized N/A N/A Wound Status: No N/A N/A Wound Recurrence: 0x0x0 N/A N/A Measurements L x W x D (cm) 0 N/A N/A A (cm) :  rea 0 N/A N/A Volume (cm) : 100.00% N/A N/A % Reduction in A rea: 100.00% N/A N/A % Reduction in Volume: Grade 2 N/A N/A Classification: None Present N/A N/A Exudate A mount: None Present (0%) N/A N/A Granulation A mount: None Present (0%) N/A N/A Necrotic A mount: Fascia: No N/A N/A Exposed Structures: Fat Layer (Subcutaneous Tissue): No Tendon: No Muscle: No Joint: No Bone: No Large (67-100%) N/A N/A Epithelialization: Treatment Notes Electronic Signature(s) Signed: 04/09/2022 1:38:33 PM By: [name] MD FACS Signed: 04/09/2022 1:46:14 PM By: Dellie Catholic RN Entered By: [name] on 04/09/2022 13:38:33 -------------------------------------------------------------------------------- Multi-Disciplinary Care Plan Details Patient Name: Date of Service: [name]. 04/09/2022 1:15 PM Medical Record Number: [id number] Patient Account Number: [id number] Date of Birth/Sex: Treating RN: Nov 24, 1940 (81 y.o. [name] Primary Care Basil Blakesley: [name] Other Clinician: Referring Tiney Zipper: Treating Amillia Biffle/[name]: Mardi Mainland in Treatment: 2 Active Inactive Electronic Signature(s) Signed: 04/09/2022 1:46:14 PM By: Dellie Catholic RN Entered By: Dellie Catholic on 04/09/2022  13:43:30 -------------------------------------------------------------------------------- Pain Assessment Details Patient Name: Date of Service: [name] 04/09/2022 1:15 PM Medical Record Number: [id number] Patient Account Number: [id number] Date of Birth/Sex: Treating RN: 09/02/1941 (81 y.o. [name] Primary Care Yechiel Erny: [name] Other Clinician: Referring Kymani Laursen: Treating Elam Ellis/[name]: Mardi Mainland in Treatment: 2 Active Problems Location of Pain Severity and Description of Pain Patient Has Paino No Site Locations Pain Management and Medication Current Pain Management: Electronic Signature(s) Signed: 04/09/2022 1:46:14 PM By: Dellie Catholic RN Entered By: Dellie Catholic on 04/09/2022 13:16:59 -------------------------------------------------------------------------------- Patient/Caregiver Education Details Patient Name: Date of Service: [name] 7/17/2023andnbsp1:15 PM Medical Record Number: [id number] Patient Account Number: [id number] Date of Birth/Gender: Treating RN: 07/10/1941 (81 y.o. [name] Primary Care Physician: [name] Other Clinician: Referring Physician: Treating Physician/[name]: Mardi Mainland in Treatment: 2 Education Assessment Education Provided To: Patient Education Topics Provided Wound/Skin Impairment: Methods: Explain/Verbal Responses: Return demonstration correctly Electronic Signature(s) Signed: 04/09/2022 1:46:14 PM By: Dellie Catholic RN Entered By: Dellie Catholic on 04/09/2022 13:43:46 -------------------------------------------------------------------------------- Wound Assessment Details Patient Name: Date of Service: [name] 04/09/2022 1:15 PM Medical Record Number: [id number] Patient Account Number: [id number] Date of Birth/Sex: Treating RN: 01-14-1941 (81 y.o. [name] Primary Care  Offie Pickron: [name] Other Clinician: Referring Charlottie Peragine: Treating Davida Falconi/[name]: Mardi Mainland in Treatment: 2 Wound Status Wound Number: 21 Primary Diabetic Wound/Ulcer of the Lower Extremity Etiology: Wound Location: Right Amputation Site - Transmetatarsal Wound Healed - Epithelialized Wounding Event: Gradually Appeared Status: Date Acquired: 03/16/2022 Comorbid Coronary Artery Disease, Deep Vein Thrombosis, Hypertension, Weeks Of Treatment: 2 History: Myocardial Infarction, Peripheral Arterial Disease, Type II Diabetes Clustered Wound: No Photos Wound Measurements Length: (cm) Width: (cm) Depth: (cm) Area: (cm) Volume: (cm) 0 % Reduction in Area: 100% 0 % Reduction in Volume: 100% 0 Epithelialization: Large (67-100%) 0 Tunneling: No 0 Undermining: No Wound Description Classification: Grade 2 Exudate Amount: None Present Foul Odor After Cleansing: No Slough/Fibrino No Wound Bed Granulation Amount: None Present (0%) Exposed Structure Necrotic Amount: None Present (0%) Fascia Exposed: No Fat Layer (Subcutaneous Tissue) Exposed: No Tendon Exposed: No Muscle Exposed: No Joint Exposed: No Bone Exposed: No Electronic Signature(s) Signed: 04/09/2022 1:46:14 PM By: Dellie Catholic RN Entered By: Dellie Catholic on 04/09/2022 13:32:01 -------------------------------------------------------------------------------- Vitals Details Patient Name: Date of Service: [name] Dupes, [name] 04/09/2022 1:15 PM Medical Record Number: [id number] Patient Account Number: [id number]  Date of Birth/Sex: Treating RN: [birth year] (81 y.o. [name] Primary Care Maxyne Derocher: [name] Other Clinician: Referring Elgene Coral: Treating Eleanna Theilen/[name]: Mardi Mainland in Treatment: 2 Vital Signs Time Taken: 13:15 Temperature (F): 97.8 Weight (lbs): 145 Pulse (bpm): 75 Respiratory Rate (breaths/min):  16 Blood Pressure (mmHg): 122/72 Reference Range: 80 - 120 mg / dl Electronic Signature(s) Signed: 04/09/2022 1:46:14 PM By: Dellie Catholic RN Entered By: Dellie Catholic on 04/09/2022 13:16:18

## 2022-04-16 ENCOUNTER — Encounter (HOSPITAL_BASED_OUTPATIENT_CLINIC_OR_DEPARTMENT_OTHER): Payer: Medicare Other | Admitting: General Surgery

## 2022-04-16 ENCOUNTER — Other Ambulatory Visit: Payer: Self-pay | Admitting: Cardiovascular Disease

## 2022-04-16 ENCOUNTER — Other Ambulatory Visit: Payer: Self-pay | Admitting: Vascular Surgery

## 2022-04-16 ENCOUNTER — Other Ambulatory Visit: Payer: Self-pay | Admitting: Endocrinology

## 2022-04-16 DIAGNOSIS — H47011 Ischemic optic neuropathy, right eye: Secondary | ICD-10-CM | POA: Diagnosis not present

## 2022-04-16 DIAGNOSIS — H02831 Dermatochalasis of right upper eyelid: Secondary | ICD-10-CM | POA: Diagnosis not present

## 2022-04-16 DIAGNOSIS — H0102B Squamous blepharitis left eye, upper and lower eyelids: Secondary | ICD-10-CM | POA: Diagnosis not present

## 2022-04-16 DIAGNOSIS — Z961 Presence of intraocular lens: Secondary | ICD-10-CM | POA: Diagnosis not present

## 2022-04-16 DIAGNOSIS — H0102A Squamous blepharitis right eye, upper and lower eyelids: Secondary | ICD-10-CM | POA: Diagnosis not present

## 2022-04-16 DIAGNOSIS — H02834 Dermatochalasis of left upper eyelid: Secondary | ICD-10-CM | POA: Diagnosis not present

## 2022-05-02 DIAGNOSIS — Z1231 Encounter for screening mammogram for malignant neoplasm of breast: Secondary | ICD-10-CM | POA: Diagnosis not present

## 2022-05-15 ENCOUNTER — Ambulatory Visit (INDEPENDENT_AMBULATORY_CARE_PROVIDER_SITE_OTHER): Payer: Medicare Other | Admitting: Podiatry

## 2022-05-15 DIAGNOSIS — E119 Type 2 diabetes mellitus without complications: Secondary | ICD-10-CM | POA: Diagnosis not present

## 2022-05-15 DIAGNOSIS — I739 Peripheral vascular disease, unspecified: Secondary | ICD-10-CM | POA: Diagnosis not present

## 2022-05-15 DIAGNOSIS — L84 Corns and callosities: Secondary | ICD-10-CM

## 2022-05-15 DIAGNOSIS — I6523 Occlusion and stenosis of bilateral carotid arteries: Secondary | ICD-10-CM

## 2022-05-15 DIAGNOSIS — E1142 Type 2 diabetes mellitus with diabetic polyneuropathy: Secondary | ICD-10-CM | POA: Diagnosis not present

## 2022-05-15 NOTE — Progress Notes (Signed)
Subjective: Chief Complaint  Patient presents with   Diabetes    Pt came in for diabetic foot care A1c- 6.8 BG- 100 rt foot opens     81 year old female presents the office today for callus on the right foot.  She states that currently she has not had any open sores.  It is opened up as previously she was treated at the wound care center but the wounds have since healed.  No current swelling or redness.  No drainage or pus.  No fevers or chills.  Objective: AAO x3, NAD DP/PT pulses palpable bilaterally, CRT less than 3 seconds Status post bilateral transmetatarsal amputations.  On the distal medial aspect on the right side there is a hyperkeratotic lesion but upon debridement there is no underlying ulceration drainage or any signs of infection.  There is no open lesions bilaterally. No pain with calf compression, swelling, warmth, erythema  Assessment: Preulcerative callus right foot  Plan: -All treatment options discussed with the patient including all alternatives, risks, complications.  -Sharply debrided hyperkeratotic lesion without any complications or bleeding.  Recommend moisturizer, offloading daily. -Patient encouraged to call the office with any questions, concerns, change in symptoms.   Trula Slade DPM

## 2022-05-18 ENCOUNTER — Other Ambulatory Visit: Payer: Self-pay | Admitting: Endocrinology

## 2022-05-18 ENCOUNTER — Other Ambulatory Visit: Payer: Self-pay | Admitting: Cardiovascular Disease

## 2022-06-06 ENCOUNTER — Other Ambulatory Visit (INDEPENDENT_AMBULATORY_CARE_PROVIDER_SITE_OTHER): Payer: Medicare Other

## 2022-06-06 DIAGNOSIS — E1165 Type 2 diabetes mellitus with hyperglycemia: Secondary | ICD-10-CM

## 2022-06-06 LAB — BASIC METABOLIC PANEL
BUN: 20 mg/dL (ref 6–23)
CO2: 25 mEq/L (ref 19–32)
Calcium: 9.9 mg/dL (ref 8.4–10.5)
Chloride: 103 mEq/L (ref 96–112)
Creatinine, Ser: 0.92 mg/dL (ref 0.40–1.20)
GFR: 58.92 mL/min — ABNORMAL LOW (ref >60.00)
Glucose, Bld: 258 mg/dL — ABNORMAL HIGH (ref 70–99)
Potassium: 4.9 mEq/L (ref 3.5–5.1)
Sodium: 138 mEq/L (ref 135–145)

## 2022-06-06 LAB — HEMOGLOBIN A1C: Hgb A1c MFr Bld: 8 % — ABNORMAL HIGH (ref 4.6–6.5)

## 2022-06-13 ENCOUNTER — Ambulatory Visit (INDEPENDENT_AMBULATORY_CARE_PROVIDER_SITE_OTHER): Payer: Medicare Other | Admitting: Endocrinology

## 2022-06-13 ENCOUNTER — Encounter: Payer: Self-pay | Admitting: Endocrinology

## 2022-06-13 VITALS — BP 140/72 | HR 96 | Ht 65.0 in | Wt 140.6 lb

## 2022-06-13 DIAGNOSIS — I10 Essential (primary) hypertension: Secondary | ICD-10-CM

## 2022-06-13 DIAGNOSIS — E1165 Type 2 diabetes mellitus with hyperglycemia: Secondary | ICD-10-CM

## 2022-06-13 DIAGNOSIS — I6523 Occlusion and stenosis of bilateral carotid arteries: Secondary | ICD-10-CM | POA: Diagnosis not present

## 2022-06-13 MED ORDER — NOVOLOG MIX 70/30 FLEXPEN (70-30) 100 UNIT/ML ~~LOC~~ SUPN: 15.0000 [IU] | PEN_INJECTOR | Freq: Every day | SUBCUTANEOUS | 1 refills | Status: DC

## 2022-06-13 MED ORDER — INSULIN PEN NEEDLE 31G X 5 MM MISC: 1 refills | Status: DC

## 2022-06-13 NOTE — Progress Notes (Signed)
Patient ID: [name], female   DOB: 1940-12-16, 81 y.o.   MRN: [id number]          Reason for Appointment: Follow-up for endocrinology problems   History of Present Illness:          Date of diagnosis of type 2 diabetes mellitus: 1984       Background history:   She has been on metformin since about the time of her diagnosis and has been on other drugs also but has not had many changes in her regimen in the last several years She thinks her blood sugar used to be fairly well controlled At some point she was given Byetta for about a year and a half which improved her control but this was subsequently start because of it being too expensive for her.  She had no side effects with this  Recent history:   Her A1c is increased at 8%  Non-insulin hypoglycemic regimen: Amaryl 1 mg in a.m., Jardiance 25 mg daily,   metformin 1 g daily, Prandin 1 mg before breakfast  Current management, level of control, blood sugar patterns and problems identified:  She is still having significantly high readings during the day without high readings fasting as usual This is despite adding some protein with her breakfast consistently such as walnuts or eggs Also blood sugars after lunch or as high as 275 Generally fasting readings are fairly good as low as 89 She is trying to take all her medications regularly as prescribed As before not able to exercise No hypoglycemia Lost some weight since 5/23        Side effects from medications have been: None  Compliance with the medical regimen: Good Hypoglycemia:   None        Blood Glucose data from One Touch meter download:    PRE-MEAL Fasting Lunch Dinner Bedtime Overall  Glucose range: 89-209 117-195  130, 166   Mean/median: 124 174   160   POST-MEAL PC Breakfast PC Lunch PC Dinner  Glucose range:  200, 275   Mean/median:      Previously  PRE-MEAL Fasting Lunch Dinner Bedtime Overall  Glucose range: 90-139 154-261   90-261  Mean/median:  119 195   145   POST-MEAL PC Breakfast PC Lunch PC Dinner  Glucose range:   145  Mean/median:  140       Self-care: The diet that the patient has been following is: tries to limit sweets and fried food.     Meal times are:  Breakfast is at 8:30 AM lunch: 1 PM, dinner: 6:30 PM  Typical meal intake: Breakfast is yogurt/cereal with fruit or egg/toast or oatmeal Sometimes drinking boost  Lunch is usually soup with crackers, dinner is usually a low-fat meat with vegetables Snacks will be celery and ranch dressing               Dietician visit, most recent: 10/18                Weight history:  Wt Readings from Last 3 Encounters:  06/13/22 140 lb 9.6 oz (63.8 kg)  02/13/22 144 lb (65.3 kg)  02/07/22 146 lb 6.4 oz (66.4 kg)    Glycemic control:   Lab Results  Component Value Date   HGBA1C 8.0 (H) 06/06/2022   HGBA1C 6.8 (H) 02/01/2022   HGBA1C 7.6 (H) 09/29/2021   Lab Results  Component Value Date   MICROALBUR 3.8 (H) 09/29/2021   LDLCALC 50 10/14/2021  CREATININE 0.92 06/06/2022   Lab Results  Component Value Date   MICRALBCREAT 16.3 09/29/2021    Lab Results  Component Value Date   FRUCTOSAMINE 261 11/28/2021   FRUCTOSAMINE 275 09/29/2021   FRUCTOSAMINE 237 03/29/2021   Lab Results  Component Value Date   HGB 10.4 (L) 10/27/2021      Allergies as of 06/13/2022       Reactions   Augmentin [amoxicillin-pot Clavulanate] Other (See Comments)   Elevates potassium level   Bactrim [sulfamethoxazole-trimethoprim] Other (See Comments)   ^ K+( elevated)    Demerol [meperidine]    Delusional    Meperidine Hcl    Other reaction(s): delusional   Scopolamine    Delusional    Tramadol Other (See Comments)   Keeps awake   Versed [midazolam] Anxiety   Frantic, out of my mind, agitated         Medication List        Accurate as of June 13, 2022  5:39 PM. If you have any questions, ask your nurse or doctor.          acetaminophen 500 MG  tablet Commonly known as: TYLENOL Take 1,000 mg by mouth every 6 (six) hours as needed for moderate pain or headache.   aspirin EC 81 MG tablet Take 81 mg by mouth at bedtime.   atorvastatin 80 MG tablet Commonly known as: LIPITOR TAKE 1 TABLET BY MOUTH ONCE DAILY. MUST  KEEP  UPCOMING  APPOINTMENT  FOR  FUTURE  REFILLS   brimonidine 0.2 % ophthalmic solution Commonly known as: ALPHAGAN Place 1 drop into the right eye 2 (two) times daily.   CINNAMON PO Take 2 tablets by mouth in the morning, at noon, and at bedtime.   [employer] SENIOR PROBIOTIC PO Take 1 capsule by mouth every evening.   glimepiride 1 MG tablet Commonly known as: AMARYL Take 2 tablets (2 mg total) by mouth daily before breakfast.   hydrochlorothiazide 12.5 MG tablet Commonly known as: HYDRODIURIL Take 1 tablet by mouth once daily   Insulin Pen Needle 31G X 5 MM Misc Use for insulin pens Started by: Elayne Snare, MD   Jardiance 25 MG Tabs tablet Generic drug: empagliflozin TAKE 1 TABLET BY MOUTH ONCE DAILY BEFORE BREAKFAST   metFORMIN 1000 MG tablet Commonly known as: GLUCOPHAGE Take 1 tablet by mouth twice daily   metoprolol tartrate 25 MG tablet Commonly known as: LOPRESSOR TAKE 1 & 1/2 (ONE & ONE-HALF) TABLETS BY MOUTH TWICE DAILY   NovoLOG Mix 70/30 FlexPen (70-30) 100 UNIT/ML FlexPen Generic drug: insulin aspart protamine - aspart Inject 15 Units into the skin daily with breakfast. Started by: Elayne Snare, MD   OneTouch Delica Plus [id number] Misc Use to check blood sugars twice daily.   OneTouch Verio test strip Generic drug: glucose blood Use to check blood sugars once daily   pantoprazole 40 MG tablet Commonly known as: PROTONIX Take 1 tablet by mouth once daily   pyridoxine 100 MG tablet Commonly known as: B-6 Take 100 mg by mouth every evening.   repaglinide 2 MG tablet Commonly known as: Prandin Take 1 tablet (2 mg total) by mouth daily before breakfast.   Systane Balance 0.6 %  Soln Generic drug: Propylene Glycol Place 1 drop into the left eye 3 (three) times daily as needed (dry/irritated eyes).   telmisartan 80 MG tablet Commonly known as: MICARDIS Take 1 tablet by mouth once daily   Xarelto 2.5 MG Tabs tablet Generic drug: rivaroxaban Take  1 tablet by mouth twice daily        Allergies:  Allergies  Allergen Reactions   Augmentin [Amoxicillin-Pot Clavulanate] Other (See Comments)    Elevates potassium level   Bactrim [Sulfamethoxazole-Trimethoprim] Other (See Comments)    ^ K+( elevated)    Demerol [Meperidine]     Delusional    Meperidine Hcl     Other reaction(s): delusional   Scopolamine     Delusional    Tramadol Other (See Comments)    Keeps awake   Versed [Midazolam] Anxiety    Frantic, out of my mind, agitated     Past Medical History:  Diagnosis Date   Anemia    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 There is a small calcified mass in the L:A near the LA appendage. No valvular masses seen with associated mitral annular calcification. LA Volume/ BSA27.4 ml/m2 No AS. Right ventricular systolic pressure is elevated at 76mHg.   Myocardial infarction ([facility]    June 2021   Neuromuscular disorder ([facility]    neuropathy in bilateral feet   Peripheral vascular disease ([facility]    Pneumonia    not hosp.    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  PTCAs with drug coated balloon Lt ant tibial artery and Lt popliteal artery  11/26/2019    Past Surgical History:   Procedure Laterality Date   ABDOMINAL AORTAGRAM  11/25/2019   ABDOMINAL AORTOGRAM W/LOWER EXTREMITY    ABDOMINAL AORTOGRAM N/A 06/15/2019   Procedure: ABDOMINAL AORTOGRAM;  Surgeon: BLorretta Harp MD;  [facility];  Service: Cardiovascular;  Laterality: N/A;   ABDOMINAL AORTOGRAM W/LOWER EXTREMITY N/A 11/25/2019   Procedure: ABDOMINAL AORTOGRAM W/LOWER EXTREMITY;  Surgeon: AWellington Hampshire MD;  [place] [facility];  Service: Cardiovascular;  Laterality: N/A;  Lt leg   ABDOMINAL AORTOGRAM W/LOWER EXTREMITY N/A 08/01/2020   Procedure: ABDOMINAL AORTOGRAM W/LOWER EXTREMITY;  Surgeon: BLorretta Harp MD;  [facility];  Service: Cardiovascular;  Laterality: N/A;   ABDOMINAL AORTOGRAM W/LOWER EXTREMITY N/A 04/28/2021   Procedure: ABDOMINAL AORTOGRAM W/LOWER EXTREMITY;  Surgeon: FElam Dutch MD;  [facility];  Service: Cardiovascular;  Laterality: N/A;   ABDOMINAL AORTOGRAM W/LOWER EXTREMITY N/A 10/13/2021   Procedure: ABDOMINAL AORTOGRAM W/LOWER EXTREMITY;  Surgeon: HCherre Robins MD;  [facility];  Service: Cardiovascular;  Laterality: N/A;   AMPUTATION TOE Left 05/03/2021   Procedure: AMPUTATION OF THIRD LEFT  TOE;  Surgeon: HCherre Robins MD;  [facility];  Service: Vascular;  Laterality: Left;   APPENDECTOMY     APPLICATION OF WOUND VAC Right 07/21/2019   Procedure: Application Of Prevena Wound Vac Right Groin;  Surgeon: BSerafina Mitchell MD;  [facility];  Service: Vascular;  Laterality: Right;   CARDIAC CATHETERIZATION     CARPAL TUNNEL RELEASE     CARPAL TUNNEL RELEASE  CESAREAN SECTION     x 2   COLONOSCOPY     CORONARY ARTERY BYPASS GRAFT N/A 03/24/2020   Procedure: CORONARY ARTERY BYPASS GRAFTING (CABG) using LIMA to LAD (m); RIMA to RAMUS; Endoscopic Right Greater Saphenous Vein: SVG to Diag1; SVG to PLB (right); and SVG to PL (left).;  Surgeon: [name], MD;  Location: [place];   Service: Open Heart Surgery;  Laterality: N/A;  BILATERAL IMA   ENDARTERECTOMY FEMORAL Right 07/21/2019   Procedure: RIGHT ENDARTERECTOMY FEMORAL WITH PATCH ANGIOPLASTY;  Surgeon: [name], MD;  Location: [place];  Service: Vascular;  Laterality: Right;   ENDARTERECTOMY FEMORAL Right 08/16/2020   Procedure: RIGHT FEMORAL ENDARTERECTOMY;  Surgeon: [name], MD;  Location: [place];  Service: Vascular;  Laterality: Right;   ENDOVEIN HARVEST OF GREATER SAPHENOUS VEIN Right 03/24/2020   Procedure: ENDOVEIN HARVEST OF GREATER SAPHENOUS VEIN;  Surgeon: [name], MD;  Location: [place];  Service: Open Heart Surgery;  Laterality: Right;   EYE SURGERY     cataract removal bilaterally   FEMORAL-POPLITEAL BYPASS GRAFT Right 08/16/2020   Procedure: BYPASS GRAFT FEMORAL-POPLITEAL ARTERY;  Surgeon: [name], MD;  Location: [place];  Service: Vascular;  Laterality: Right;   FEMORAL-POPLITEAL BYPASS GRAFT Left 05/03/2021   Procedure: LEFT FEMORAL TO BELOW KNEE POPLITEAL ARTERY BYPASS GRAFTING WITH 6MMX80 PTFE GRAFT;  Surgeon: [name], MD;  Location: [place];  Service: Vascular;  Laterality: Left;   INTRAVASCULAR LITHOTRIPSY  11/25/2019   Procedure: INTRAVASCULAR LITHOTRIPSY;  Surgeon: [name], MD;  Location: [place] [facility];  Service: Cardiovascular;;  LT. SFA   LEFT HEART CATH AND CORONARY ANGIOGRAPHY N/A 03/22/2020   Procedure: LEFT HEART CATH AND CORONARY ANGIOGRAPHY;  Surgeon: Martinique, Peter M, MD;  Location: [place] [facility];  Service: Cardiovascular;  Laterality: N/A;   LOWER EXTREMITY ANGIOGRAPHY Bilateral 06/15/2019   Procedure: Lower Extremity Angiography;  Surgeon: [name], MD;  Location: [place] [facility];  Service: Cardiovascular;  Laterality: Bilateral;   LOWER EXTREMITY ANGIOGRAPHY Right 08/03/2019   Procedure: LOWER EXTREMITY ANGIOGRAPHY;  Surgeon: [name], MD;  Location: [place] [facility];  Service: Cardiovascular;   Laterality: Right;   LOWER EXTREMITY ANGIOGRAPHY N/A 09/07/2021   Procedure: LOWER EXTREMITY ANGIOGRAPHY;  Surgeon: Marty Heck, MD;  Location: [place] [facility];  Service: Cardiovascular;  Laterality: N/A;   PATCH ANGIOPLASTY Right 07/21/2019   Procedure: Patch Angioplasty Right Femoral Artery;  Surgeon: [name], MD;  Location: [place];  Service: Vascular;  Laterality: Right;   PERIPHERAL VASCULAR ATHERECTOMY  08/03/2019   Procedure: PERIPHERAL VASCULAR ATHERECTOMY;  Surgeon: [name], MD;  Location: [place] [facility];  Service: Cardiovascular;;  right SFA, right TP trunk   PERIPHERAL VASCULAR ATHERECTOMY  11/25/2019   Procedure: PERIPHERAL VASCULAR ATHERECTOMY;  Surgeon: [name], MD;  Location: [place] [facility];  Service: Cardiovascular;;  Lt.  POPLITEAL and AT   PERIPHERAL VASCULAR BALLOON ANGIOPLASTY Left 06/15/2019   Procedure: PERIPHERAL VASCULAR BALLOON ANGIOPLASTY;  Surgeon: [name], MD;  Location: [place] [facility];  Service: Cardiovascular;  Laterality: Left;  SFA UNSUCCESSFUL UNABLE TO CROSS LESION   PERIPHERAL VASCULAR BALLOON ANGIOPLASTY  08/03/2019   Procedure: PERIPHERAL VASCULAR BALLOON ANGIOPLASTY;  Surgeon: [name], MD;  Location: [place] [facility];  Service: Cardiovascular;;  right SFA, Right TP trunk   PERIPHERAL VASCULAR BALLOON ANGIOPLASTY  09/07/2021   Procedure: PERIPHERAL VASCULAR BALLOON  ANGIOPLASTY;  Surgeon: Marty Heck, MD;  Location: [place] [facility];  Service: Cardiovascular;;   PERIPHERAL VASCULAR BALLOON ANGIOPLASTY Right 10/13/2021   Procedure: PERIPHERAL VASCULAR BALLOON ANGIOPLASTY;  Surgeon: [name], MD;  Location: [place] [facility];  Service: Cardiovascular;  Laterality: Right;   TEE WITHOUT CARDIOVERSION N/A 03/24/2020   Procedure: TRANSESOPHAGEAL ECHOCARDIOGRAM (TEE);  Surgeon: [name], MD;  Location: [place];  Service: Open Heart Surgery;  Laterality: N/A;   TONSILLECTOMY      and adenoidectomy   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/2019   Procedure: TRANSMETATARSAL AMPUTATION;  Surgeon: Trula Slade, DPM;  Location: [place];  Service: Podiatry;  Laterality: Right;   TRANSMETATARSAL AMPUTATION Left 05/11/2021   Procedure: LEFT TRANSMETATARSAL AMPUTATION;  Surgeon: [name], MD;  Location: [facility] OR;  Service: Vascular;  Laterality: Left;   TUBAL LIGATION      Family History  Problem Relation Age of Onset   Cancer - Prostate Father    Cancer - Colon Father    Stroke Mother    Hypertension Mother    Hyperlipidemia Mother    Melanoma Brother     Social History:  reports that she quit smoking about 50 years ago. Her smoking use included cigarettes. She has never used smokeless tobacco. She reports that she does not drink alcohol and does not use drugs.   Review of Systems   Lipid history: Hypercholesterolemia treated by PCP with 20 mg Lipitor    Lab Results  Component Value Date   CHOL 97 10/14/2021   HDL 29 (L) 10/14/2021   LDLCALC 50 10/14/2021   TRIG 89 10/14/2021   CHOLHDL 3.3 10/14/2021           Hypertension: Present for several years, currently treated with  Micardis 80 mg, metoprolol, and HCTZ, also followed by cardiologist  Also checks blood pressure at home  BP Readings from Last 3 Encounters:  06/13/22 (!) 140/72  02/13/22 (!) 162/68  02/07/22 (!) 152/80   Renal function history:  Lab Results  Component Value Date   CREATININE 0.92 06/06/2022   CREATININE 1.00 02/01/2022   CREATININE 1.02 11/28/2021     Most recent eye exam was in 11/2021  History of anemia:   Lab Results  Component Value Date   HGB 10.4 (L) 10/27/2021     She has had significant PAD followed by vascular cardiologist No recent foot infections, followed by podiatrist  LABS:  No visits with results within 1 Week(s) from this visit.  Latest known visit with results is:  Lab on 06/06/2022  Component Date Value Ref Range Status    Sodium 06/06/2022 138  135 - 145 mEq/L Final   Potassium 06/06/2022 4.9  3.5 - 5.1 mEq/L Final   Chloride 06/06/2022 103  96 - 112 mEq/L Final   CO2 06/06/2022 25  19 - 32 mEq/L Final   Glucose, Bld 06/06/2022 258 (H)  70 - 99 mg/dL Final   BUN 06/06/2022 20  6 - 23 mg/dL Final   Creatinine, Ser 06/06/2022 0.92  0.40 - 1.20 mg/dL Final   GFR 06/06/2022 58.92 (L)  >60.00 mL/min Final   Calculated using the CKD-EPI Creatinine Equation (2021)   Calcium 06/06/2022 9.9  8.4 - 10.5 mg/dL Final   Hgb A1c MFr Bld 06/06/2022 8.0 (H)  4.6 - 6.5 % Final   Glycemic Control Guidelines for People with Diabetes:Non Diabetic:  <6%Goal of Therapy: <7%Additional Action Suggested:  >8%     Physical Examination:  BP (!) 140/72   Pulse 96   Ht '5\' 5"'$  (1.651 m)   Wt 140 lb 9.6 oz (63.8 kg)   SpO2 97%   BMI 23.40 kg/m       ASSESSMENT:  Diabetes type 2 non-insulin-dependent  See history of present illness for detailed discussion of current diabetes management, blood sugar patterns and problems identified  She is on Prandin 1 mg in the morning, Jardiance 25 mg, metformin and Amaryl 1 mg  Her A1c is 8% and significantly higher than before  As before most of her high readings are after breakfast and lunch but fairly good at breakfast usually This is despite taking Prandin and Amaryl in the morning Likely has now developed insulin deficiency  Has had some weight loss Previously could not afford Ozempic  HYPERTENSION: Controlled, tends to have high readings in the office  Renal function stable and is on Jardiance  PLAN:    Will need to add premixed insulin in the morning  Discussed in detail the action of insulins and composition of the premixed insulin Timing of the insulin was discussed Likely needs small doses of insulin and for simplicity will only start insulin once a day in the morning with premixed insulin She will titrate the dose further by 2 units if blood sugars at lunch or dinner  are continuing to be mostly over 150 She is familiar with the injection technique with the previous use of Ozempic  She can stop Prandin  However need to have more consistent glucose monitoring and will start her on the freestyle libre sensor which should be approved now with her being on insulin Explained to her and discussed how the sensor would be used and information provided She will have the prescription done through the [facility] website She will see the diabetes educator for instructions on starting and adjusting her insulin  No change in Amaryl or Jardiance Discussed blood sugar targets   Patient Instructions  Take 8 Units before breakfast and go every 3 days  Target sugar at lunch and supper 150 or less every 3 days   Stop Repeglanide       Total visit time including counseling = 30 minutes  Elayne Snare 06/13/2022, 5:39 PM   Note: This office note was prepared with Dragon voice recognition system technology. Any transcriptional errors that result from this process are unintentional.

## 2022-06-13 NOTE — Patient Instructions (Addendum)
Take 8 Units before breakfast and go every 3 days  Target sugar at lunch and supper 150 or less every 3 days   Stop Repeglanide

## 2022-06-17 DIAGNOSIS — Z23 Encounter for immunization: Secondary | ICD-10-CM | POA: Diagnosis not present

## 2022-06-18 ENCOUNTER — Other Ambulatory Visit: Payer: Self-pay | Admitting: Endocrinology

## 2022-06-26 ENCOUNTER — Telehealth: Payer: Self-pay

## 2022-06-26 NOTE — Telephone Encounter (Signed)
Per patient PA is needed for Novolog mix please start PA

## 2022-06-27 ENCOUNTER — Other Ambulatory Visit: Payer: Self-pay

## 2022-06-27 ENCOUNTER — Other Ambulatory Visit (HOSPITAL_COMMUNITY): Payer: Self-pay

## 2022-06-27 NOTE — Telephone Encounter (Signed)
Patient Advocate Encounter   Received notification that prior authorization for NovoLOG Mix 70/30 FlexPen (70-30) 100UNIT/ML pen-injectors is required/requested.  Per Test Claim: Humalog 75/25 and Humalog 50/50 are covered   PA submitted on 06/27/22 to [place] Medicare Part D via CoverMyMeds Key [id number]  Status is pending

## 2022-06-28 ENCOUNTER — Other Ambulatory Visit: Payer: Self-pay

## 2022-06-28 ENCOUNTER — Emergency Department (HOSPITAL_COMMUNITY)
Admission: EM | Admit: 2022-06-28 | Discharge: 2022-06-28 | Disposition: A | Discharge status: Home / Self Care | Payer: Medicare Other | Attending: Emergency Medicine | Admitting: Emergency Medicine

## 2022-06-28 ENCOUNTER — Emergency Department (HOSPITAL_COMMUNITY): Payer: Medicare Other

## 2022-06-28 ENCOUNTER — Encounter (HOSPITAL_COMMUNITY): Payer: Self-pay | Admitting: Emergency Medicine

## 2022-06-28 DIAGNOSIS — K8020 Calculus of gallbladder without cholecystitis without obstruction: Secondary | ICD-10-CM | POA: Diagnosis not present

## 2022-06-28 DIAGNOSIS — I10 Essential (primary) hypertension: Secondary | ICD-10-CM | POA: Insufficient documentation

## 2022-06-28 DIAGNOSIS — K5792 Diverticulitis of intestine, part unspecified, without perforation or abscess without bleeding: Secondary | ICD-10-CM | POA: Insufficient documentation

## 2022-06-28 DIAGNOSIS — I700 Atherosclerosis of aorta: Secondary | ICD-10-CM | POA: Diagnosis not present

## 2022-06-28 DIAGNOSIS — Z7982 Long term (current) use of aspirin: Secondary | ICD-10-CM | POA: Diagnosis not present

## 2022-06-28 DIAGNOSIS — Z7901 Long term (current) use of anticoagulants: Secondary | ICD-10-CM | POA: Insufficient documentation

## 2022-06-28 DIAGNOSIS — R0602 Shortness of breath: Secondary | ICD-10-CM | POA: Diagnosis not present

## 2022-06-28 DIAGNOSIS — Z794 Long term (current) use of insulin: Secondary | ICD-10-CM | POA: Diagnosis not present

## 2022-06-28 DIAGNOSIS — R932 Abnormal findings on diagnostic imaging of liver and biliary tract: Secondary | ICD-10-CM | POA: Diagnosis not present

## 2022-06-28 DIAGNOSIS — Z7984 Long term (current) use of oral hypoglycemic drugs: Secondary | ICD-10-CM | POA: Insufficient documentation

## 2022-06-28 DIAGNOSIS — E119 Type 2 diabetes mellitus without complications: Secondary | ICD-10-CM | POA: Insufficient documentation

## 2022-06-28 DIAGNOSIS — R1010 Upper abdominal pain, unspecified: Secondary | ICD-10-CM | POA: Diagnosis not present

## 2022-06-28 DIAGNOSIS — R9389 Abnormal findings on diagnostic imaging of other specified body structures: Secondary | ICD-10-CM | POA: Diagnosis not present

## 2022-06-28 DIAGNOSIS — Z79899 Other long term (current) drug therapy: Secondary | ICD-10-CM | POA: Diagnosis not present

## 2022-06-28 DIAGNOSIS — R079 Chest pain, unspecified: Secondary | ICD-10-CM | POA: Diagnosis not present

## 2022-06-28 DIAGNOSIS — R109 Unspecified abdominal pain: Secondary | ICD-10-CM | POA: Diagnosis not present

## 2022-06-28 LAB — HEPATIC FUNCTION PANEL
ALT: 54 U/L — ABNORMAL HIGH (ref 0–44)
AST: 125 U/L — ABNORMAL HIGH (ref 15–41)
Albumin: 4.1 g/dL (ref 3.5–5.0)
Alkaline Phosphatase: 98 U/L (ref 38–126)
Bilirubin, Direct: 0.2 mg/dL (ref 0.0–0.2)
Indirect Bilirubin: 0.5 mg/dL (ref 0.3–0.9)
Total Bilirubin: 0.7 mg/dL (ref 0.3–1.2)
Total Protein: 7.4 g/dL (ref 6.5–8.1)

## 2022-06-28 LAB — CBC
HCT: 39.6 % (ref 36.0–46.0)
Hemoglobin: 12.7 g/dL (ref 12.0–15.0)
MCH: 29.5 pg (ref 26.0–34.0)
MCHC: 32.1 g/dL (ref 30.0–36.0)
MCV: 91.9 fL (ref 80.0–100.0)
Platelets: 329 10*3/uL (ref 150–400)
RBC: 4.31 MIL/uL (ref 3.87–5.11)
RDW: 14.3 % (ref 11.5–15.5)
WBC: 14.5 10*3/uL — ABNORMAL HIGH (ref 4.0–10.5)
nRBC: 0 % (ref 0.0–0.2)

## 2022-06-28 LAB — LIPASE, BLOOD: Lipase: 104 U/L — ABNORMAL HIGH (ref 11–51)

## 2022-06-28 LAB — BASIC METABOLIC PANEL
Anion gap: 16 — ABNORMAL HIGH (ref 5–15)
BUN: 24 mg/dL — ABNORMAL HIGH (ref 8–23)
CO2: 21 mmol/L — ABNORMAL LOW (ref 22–32)
Calcium: 10.1 mg/dL (ref 8.9–10.3)
Chloride: 103 mmol/L (ref 98–111)
Creatinine, Ser: 1.06 mg/dL — ABNORMAL HIGH (ref 0.44–1.00)
GFR, Estimated: 53 mL/min — ABNORMAL LOW (ref >60)
Glucose, Bld: 140 mg/dL — ABNORMAL HIGH (ref 70–99)
Potassium: 3.8 mmol/L (ref 3.5–5.1)
Sodium: 140 mmol/L (ref 135–145)

## 2022-06-28 LAB — TROPONIN I (HIGH SENSITIVITY)
Troponin I (High Sensitivity): 26 ng/L — ABNORMAL HIGH (ref <18)
Troponin I (High Sensitivity): 27 ng/L — ABNORMAL HIGH (ref <18)

## 2022-06-28 MED ORDER — METRONIDAZOLE 500 MG PO TABS: 500.0000 mg | ORAL_TABLET | Freq: Two times a day (BID) | ORAL | 0 refills | Status: DC

## 2022-06-28 MED ORDER — INSULIN LISPRO PROT & LISPRO (75-25 MIX) 100 UNIT/ML KWIKPEN: PEN_INJECTOR | SUBCUTANEOUS | 2 refills | Status: DC

## 2022-06-28 MED ORDER — CIPROFLOXACIN HCL 500 MG PO TABS: 500.0000 mg | ORAL_TABLET | Freq: Two times a day (BID) | ORAL | 0 refills | Status: DC

## 2022-06-28 MED ORDER — SODIUM CHLORIDE 0.9 % IV BOLUS
1000.0000 mL | Freq: Once | INTRAVENOUS | Status: AC
  Administered 2022-06-28: 1000 mL via INTRAVENOUS

## 2022-06-28 MED ORDER — IOHEXOL 350 MG/ML SOLN
75.0000 mL | Freq: Once | INTRAVENOUS | Status: AC | PRN
  Administered 2022-06-28: 75 mL via INTRAVENOUS

## 2022-06-28 NOTE — Telephone Encounter (Signed)
Rx sent 

## 2022-06-28 NOTE — ED Triage Notes (Signed)
Pt BIB [facility]. Started having sudden onset CP/SOB while at [facility], lasting 15 mins. Pain is located in the center of chest/epigastric region, 9/10 nonradiating, intermittent. Has not occurred since EMS has been with the pt. Hx MI with 5 CABG. 188/92, HR 94, 99% RA. Given 324 ASA. EKG shows RBBB.

## 2022-06-28 NOTE — Discharge Instructions (Addendum)
You have been evaluated for your symptoms.  Finding significant for evidence of diverticulitis.  Please take antibiotic as prescribed.  You do have some changes in your CT scan near the gallbladder and liver.  It is important for you to have a follow-up CT scan likely in the next month to ensure resolution.  You have evidence of gallstones in your gallbladder.  Sometimes gallbladder pain can be similar.  If you have worsening symptoms please return.  It is also important for you to follow-up with your cardiologist as well.

## 2022-06-28 NOTE — ED Notes (Signed)
Patient verbalizes understanding of discharge instructions. Opportunity for questioning and answers were provided. Armband removed by staff, pt discharged from ED.  

## 2022-06-28 NOTE — ED Notes (Signed)
Patient transported to Ultrasound 

## 2022-06-28 NOTE — ED Provider Notes (Signed)
[facility] EMERGENCY DEPARTMENT Provider Note   CSN: [id number] Arrival date & time: 06/28/22  1647     History  Chief Complaint  Patient presents with   Chest Pain    SHERISE GEERDES is a 81 y.o. female.  The history is provided by the patient, medical records and the EMS personnel. No language interpreter was used.  Chest Pain    Cerena Baine is a 81yo female with PMHx of prior NSTEMI, HTN, HLD, PAD who presents to ED d/t chest/abdominal pain. Pt states she was at the store around 4pm today when she felt an intense dull pain just below her sternum. She sat down, and store staff alerted EMS who brought her to ED. The pain was 9/10 at its worst. She denies ShOB, diaphoresis, N/V. She says that by the time EMS arrived (about 32mns) the pain was improving and is currently totally absent. She did not take anything to improve the pain. She denies dietary changes, food allergies, diarrhea, hematochezia/melena, changes to bowel frequency, hematuria, dysuria. She denies fever, chills, NS, recent sick contacts. Pt denies current abdominal pain or tenderness. She does not take NSAIDS.  Home Medications Prior to Admission medications   Medication Sig Start Date End Date Taking? Authorizing Provider  acetaminophen (TYLENOL) 500 MG tablet Take 1,000 mg by mouth every 6 (six) hours as needed for moderate pain or headache.    [provider]  aspirin EC 81 MG tablet Take 81 mg by mouth at bedtime.     [provider]  atorvastatin (LIPITOR) 80 MG tablet TAKE 1 TABLET BY MOUTH ONCE DAILY. MUST  KEEP  UPCOMING  APPOINTMENT  FOR  FUTURE  REFILLS 02/20/22   BLorretta Harp MD  brimonidine ([facility] 0.2 % ophthalmic solution Place 1 drop into the right eye 2 (two) times daily. 01/19/21   [provider]  CINNAMON PO Take 2 tablets by mouth in the morning, at noon, and at bedtime.     [provider]  glimepiride (AMARYL) 1 MG tablet Take 2 tablets (2  mg total) by mouth daily before breakfast. 02/07/22   KElayne Snare MD  glucose blood (ONETOUCH VERIO) test strip Use to check blood sugars once daily 05/10/21   KElayne Snare MD  hydrochlorothiazide (HYDRODIURIL) 12.5 MG tablet Take 1 tablet by mouth once daily 06/18/22   KElayne Snare MD  insulin aspart protamine - aspart (NOVOLOG MIX 70/30 FLEXPEN) (70-30) 100 UNIT/ML FlexPen Inject 15 Units into the skin daily with breakfast. 06/13/22   KElayne Snare MD  Insulin Lispro Prot & Lispro (HUMALOG MIX 75/25 KWIKPEN) (75-25) 100 UNIT/ML Kwikpen Inject 15 Units into the skin daily with breakfast. 06/28/22   KElayne Snare MD  Insulin Pen Needle 31G X 5 MM MISC Use for insulin pens 06/13/22   KElayne Snare MD  JARDIANCE 25 MG TABS tablet TAKE 1 TABLET BY MOUTH ONCE DAILY BEFORE BREAKFAST 05/18/22   KElayne Snare MD  Lancets (ONETOUCH DELICA PLUS LCHENID78E MISC Use to check blood sugars twice daily. 04/24/21   KElayne Snare MD  metFORMIN (GLUCOPHAGE) 1000 MG tablet Take 1 tablet by mouth twice daily 03/26/22   BLorretta Harp MD  metoprolol tartrate (LOPRESSOR) 25 MG tablet TAKE 1 & 1/2 (ONE & ONE-HALF) TABLETS BY MOUTH TWICE DAILY 04/16/22   BLorretta Harp MD  pantoprazole (PROTONIX) 40 MG tablet Take 1 tablet by mouth once daily 12/21/21   BLorretta Harp MD  Probiotic Product ([employer] SENIOR PROBIOTIC PO)  Take 1 capsule by mouth every evening.    [provider]  Propylene Glycol (SYSTANE BALANCE) 0.6 % SOLN Place 1 drop into the left eye 3 (three) times daily as needed (dry/irritated eyes).    [provider]  pyridoxine (B-6) 100 MG tablet Take 100 mg by mouth every evening.     [provider]  repaglinide (PRANDIN) 2 MG tablet Take 1 tablet (2 mg total) by mouth daily before breakfast. 02/07/22   Elayne Snare, MD  telmisartan (MICARDIS) 80 MG tablet Take 1 tablet by mouth once daily 05/18/22   [name], MD  XARELTO 2.5 MG TABS tablet Take 1 tablet by mouth twice daily 04/17/22    [name], MD      Allergies    Augmentin [amoxicillin-pot clavulanate], Bactrim [sulfamethoxazole-trimethoprim], Demerol [meperidine], Meperidine hcl, Scopolamine, Tramadol, and Versed [midazolam]    Review of Systems   Review of Systems  Cardiovascular:  Positive for chest pain.  All other systems reviewed and are negative.   Physical Exam Updated Vital Signs BP (!) 191/67   Pulse 87   Temp (!) 97.5 F (36.4 C) (Oral)   Resp 10   Ht '5\' 5"'$  (1.651 m)   Wt 63.5 kg   SpO2 100%   BMI 23.30 kg/m  Physical Exam Vitals and nursing note reviewed.  Constitutional:      General: She is not in acute distress.    Appearance: She is well-developed.  HENT:     Head: Atraumatic.  Eyes:     Conjunctiva/sclera: Conjunctivae normal.  Cardiovascular:     Rate and Rhythm: Normal rate and regular rhythm.     Pulses: Normal pulses.     Heart sounds: Normal heart sounds.  Pulmonary:     Effort: Pulmonary effort is normal.  Abdominal:     Palpations: Abdomen is soft.     Tenderness: There is no abdominal tenderness.  Musculoskeletal:     Cervical back: Neck supple.  Skin:    Findings: No rash.  Neurological:     Mental Status: She is alert and oriented to person, place, and time.     GCS: GCS eye subscore is 4. GCS verbal subscore is 5. GCS motor subscore is 6.  Psychiatric:        Mood and Affect: Mood normal.     ED Results / Procedures / Treatments   Labs (all labs ordered are listed, but only abnormal results are displayed) Labs Reviewed  BASIC METABOLIC PANEL - Abnormal; Notable for the following components:      Result Value   CO2 21 (*)    Glucose, Bld 140 (*)    BUN 24 (*)    Creatinine, Ser 1.06 (*)    GFR, Estimated 53 (*)    Anion gap 16 (*)    All other components within normal limits  CBC - Abnormal; Notable for the following components:   WBC 14.5 (*)    All other components within normal limits  HEPATIC FUNCTION PANEL - Abnormal; Notable  for the following components:   AST 125 (*)    ALT 54 (*)    All other components within normal limits  LIPASE, BLOOD - Abnormal; Notable for the following components:   Lipase 104 (*)    All other components within normal limits  TROPONIN I (HIGH SENSITIVITY) - Abnormal; Notable for the following components:   Troponin I (High Sensitivity) 26 (*)    All other components within normal limits  TROPONIN I (HIGH SENSITIVITY) - Abnormal; Notable for the following components:   Troponin I (High Sensitivity) 27 (*)    All other components within normal limits    EKG EKG Interpretation  Date/Time:  Thursday June 28 2022 16:54:38 EDT Ventricular Rate:  90 PR Interval:  146 QRS Duration: 135 QT Interval:  399 QTC Calculation: 489 R Axis:   -68 Text Interpretation: Sinus rhythm Atrial premature complex Right bundle branch block LVH Borderline prolonged QT interval Confirmed by Lajean Saver [phone]) on 06/28/2022 4:57:50 PM  Radiology CT ABDOMEN PELVIS W CONTRAST  Result Date: 06/28/2022 CLINICAL DATA:  Chest pain and shortness of breath. EXAM: CT ABDOMEN AND PELVIS WITH CONTRAST TECHNIQUE: Multidetector CT imaging of the abdomen and pelvis was performed using the standard protocol following bolus administration of intravenous contrast. RADIATION DOSE REDUCTION: This exam was performed according to the departmental dose-optimization program which includes automated exposure control, adjustment of the mA and/or kV according to patient size and/or use of iterative reconstruction technique. CONTRAST:  29m OMNIPAQUE IOHEXOL 350 MG/ML SOLN COMPARISON:  None Available. FINDINGS: Lower chest: No acute abnormality. Hepatobiliary: A 3.6 cm x 2.8 cm faint, ill-defined area of low attenuation is seen within the right lobe of the liver, adjacent to the gallbladder fossa (axial CT image 24, CT series 3). No gallstones are identified within the gallbladder lumen. A mild amount of pericholecystic inflammation  is seen which originates from an adjacent inflamed diverticulum. Pancreas: Unremarkable. No pancreatic ductal dilatation or surrounding inflammatory changes. Spleen: Normal in size without focal abnormality. Adrenals/Urinary Tract: Adrenal glands are unremarkable. Kidneys are normal, without renal calculi, focal lesion, or hydronephrosis. Bladder is unremarkable. Stomach/Bowel: Stomach is within normal limits. The appendix is surgically absent. No evidence of bowel dilatation. A moderately inflamed diverticulum is seen along the hepatic flexure (best seen on coronal reformatted images 39 through 51, CT series 6). There is no evidence of associated perforation or abscess. Vascular/Lymphatic: Aortic atherosclerosis. No enlarged abdominal or pelvic lymph nodes. Reproductive: Uterus and bilateral adnexa are unremarkable. Other: No abdominal wall hernia or abnormality. No abdominopelvic ascites. Musculoskeletal: Multilevel degenerative changes are seen throughout the lumbar spine. IMPRESSION: 1. Acute diverticulitis at the level of the hepatic flexure. 2. Ill-defined area of low attenuation within the right lobe of the liver, adjacent to the gallbladder fossa. While this may represent an area of focal fatty infiltration, correlation with follow-up abdomen and pelvis CT is recommended to exclude the presence of an underlying neoplastic process. 3. Aortic atherosclerosis. Aortic Atherosclerosis (ICD10-I70.0). Electronically Signed   By: TVirgina NorfolkM.D.   On: 06/28/2022 22:30   UKoreaAbdomen Limited  Result Date: 06/28/2022 CLINICAL DATA:  Upper abdominal pain EXAM: ULTRASOUND ABDOMEN LIMITED RIGHT UPPER QUADRANT COMPARISON:  None Available. FINDINGS: Gallbladder: 2.9 cm calculus in the lumen of the nondistended gallbladder. Wall thickening up to 4 mm. Sonographer reports no sonographic Murphy's sign. No pericholecystic fluid. Common bile duct: Diameter: 5.6 mm. No intrahepatic biliary ductal dilatation is evident.  Liver: No focal lesion identified. Within normal limits in parenchymal echogenicity. Portal vein is patent on color Doppler imaging with normal direction of blood flow towards the liver. Other: None. IMPRESSION: 1. Cholelithiasis without other ultrasound evidence of cholecystitis or biliary obstruction. Electronically Signed   By: DLucrezia EuropeM.D.   On: 06/28/2022 20:13   DG Chest Port 1 View  Result Date: 06/28/2022 CLINICAL DATA:  Sudden onset chest pain and shortness of breath. EXAM: PORTABLE CHEST 1 VIEW COMPARISON:  Chest radiograph  10/13/2021 FINDINGS: Monitoring leads overlie the patient. Cardiac contours upper limits of normal. Prior median sternotomy. No large area pulmonary consolidation. No pleural effusion or pneumothorax. IMPRESSION: No acute cardiopulmonary process. Electronically Signed   By: Lovey Newcomer M.D.   On: 06/28/2022 17:41    Procedures Procedures    Medications Ordered in ED Medications  sodium chloride 0.9 % bolus 1,000 mL (0 mLs Intravenous Stopped 06/28/22 1934)  iohexol (OMNIPAQUE) 350 MG/ML injection 75 mL (75 mLs Intravenous Contrast Given 06/28/22 2206)    ED Course/ Medical Decision Making/ A&P                           Medical Decision Making Amount and/or Complexity of Data Reviewed Labs: ordered. Radiology: ordered.  Risk Prescription drug management.   BP (!) 182/53   Pulse 91   Temp 97.9 F (36.6 C) (Oral)   Resp 17   Ht '5\' 5"'$  (1.651 m)   Wt 63.5 kg   SpO2 100%   BMI 23.30 kg/m   62:56 PM 81 year old female who has significant history of cardiac disease status post 5 bypass presenting with complaints of chest pain.  Patient report she was at [place] up medication for diabetes when she developed cute onset of pain to her epigastric region.  She described pain as a dull sensation going across upper abdomen, intense, lasting for approximately 15 minutes and resolved.  She did notice some mild pain to her lower back which did resolve as  well.  Pain is not associate with any lightheadedness, dizziness, nausea, shortness of breath, or diaphoresis.  When EMS arrived, pain has mostly resolved.  She did receive 4 baby aspirin via EMS.  Patient denies fever chills productive cough dysuria bowel bladder changes.  She does have significant history of PAD status post several toe amputations in the past.  On exam this is an elderly female sitting in bed resting comfortably appears to be in no acute discomfort.  Heart and lung sounds normal abdomen is soft and nontender.  No reproducible tenderness to her back.  She does have prior toes amputation involving bilateral foot without signs of infection.  Vital signs remarkable for elevated blood pressure of 191/67.  Patient is afebrile no hypoxia and normal heart rate.  Cardiac work-up initiated.  Given abdominal discomfort, will also check hepatic function panel and lipase but have low suspicion for biliary disease at this time.  Care discussed with DR. Steinl  7:13 PM Labs, EKG, and imaging obtained independently viewed interpreted by me and agree with radiologist interpretation.  EKG without concerning arrhythmia or ischemic changes.  Chest x-ray unremarkable.  Lab however is remarkable for elevated lipase of 104, some evidence of transaminitis with AST 125, ALT 54 and patient has an elevated count of 14.5.  Troponin is minimally elevated at 26.  Although she does not have any significant reproducible abdominal discomfort given her upper abdominal pain initially, will obtain limited abdominal ultrasound.  If negative, will consider abdominal pelvis CT scan for further assessment.  11:17 PM Limited abdominal ultrasound was obtained demonstrating evidence of cholelithiasis without evidence of cholecystitis or biliary obstruction.  A CT scan of the abdomen and pelvis obtained which demonstrate acute diverticulitis at the level of the hepatic flexure.  An ill-defined area within the right lobe of the liver  adjacent to the gallbladder fossa which may be an area of focal fatty infiltration however radiologist recommend a follow-up abdominal pelvis CT  scan to exclude the presence of an underlying neoplastic process.  Given this information, I discussed with patient plan of care.  First, we will treat diverticulitis with Cipro and Flagyl as patient is allergic to Augmentin.  I also recommend patient to follow-up with PCP to have repeat CT scan in a month.  I also gave patient strict return precaution.  Patient has flat but mildly elevated troponin.  It is similar to her baseline.  I have low suspicion for ACS.  Patient remains symptom-free.  Anticipate she is stable to be discharged.  I have considered admission but felt she is stable to go home.  Blood pressure is elevated but doubt hypertensive emergency.  This patient presents to the ED for concern of upper abd pain, this involves an extensive number of treatment options, and is a complaint that carries with it a high risk of complications and morbidity.  The differential diagnosis includes biliary disease, gastritis, acs, PE, dissection, pna, msk  Co morbidities that complicate the patient evaluation cardiac disease  PAD  DM Additional history obtained:  Additional history obtained from patient External records from outside source obtained and reviewed including EMR  Lab Tests:  I Ordered, and personally interpreted labs.  The pertinent results include:  as above  Imaging Studies ordered:  I ordered imaging studies including abd/pelvis CT I independently visualized and interpreted imaging which showed diverticulitis I agree with the radiologist interpretation  Cardiac Monitoring:  The patient was maintained on a cardiac monitor.  I personally viewed and interpreted the cardiac monitored which showed an underlying rhythm of: NSR  Medicines ordered and prescription drug management:  I ordered medication including IVF  for  dehydration Reevaluation of the patient after these medicines showed that the patient improved I have reviewed the patients home medicines and have made adjustments as needed  Test Considered: as above  Critical Interventions: IVF  Consultations Obtained:  I requested consultation with the attending DR. Steinl,  and discussed lab and imaging findings as well as pertinent plan - they recommend: outpt f/u  Problem List / ED Course: chest pain  Upper abd pain  Acute diverticulitis  gallstone  Reevaluation:  After the interventions noted above, I reevaluated the patient and found that they have :improved  Social Determinants of Health: tobacco use  Dispostion:  After consideration of the diagnostic results and the patients response to treatment, I feel that the patent would benefit from outpt f/u.          Final Clinical Impression(s) / ED Diagnoses Final diagnoses:  Acute diverticulitis  Abnormal CT scan, gallbladder  Calculus of gallbladder without cholecystitis without obstruction    Rx / DC Orders ED Discharge Orders          Ordered    ciprofloxacin (CIPRO) 500 MG tablet  Every 12 hours        06/28/22 2316    metroNIDAZOLE (FLAGYL) 500 MG tablet  2 times daily        06/28/22 2316              Domenic Moras, PA-C 06/28/22 2324    Lajean Saver, MD 06/29/22 1325

## 2022-06-28 NOTE — Addendum Note (Signed)
Addended by: Cinda Quest on: 06/28/2022 08:06 AM   Modules accepted: Orders

## 2022-07-10 DIAGNOSIS — K8020 Calculus of gallbladder without cholecystitis without obstruction: Secondary | ICD-10-CM | POA: Diagnosis not present

## 2022-07-10 DIAGNOSIS — R1011 Right upper quadrant pain: Secondary | ICD-10-CM | POA: Diagnosis not present

## 2022-07-10 DIAGNOSIS — Z23 Encounter for immunization: Secondary | ICD-10-CM | POA: Diagnosis not present

## 2022-07-11 ENCOUNTER — Ambulatory Visit: Payer: Medicare Other | Admitting: Endocrinology

## 2022-07-17 ENCOUNTER — Other Ambulatory Visit: Payer: Self-pay | Admitting: Family Medicine

## 2022-07-17 DIAGNOSIS — R160 Hepatomegaly, not elsewhere classified: Secondary | ICD-10-CM

## 2022-07-18 ENCOUNTER — Other Ambulatory Visit: Payer: Self-pay | Admitting: Endocrinology

## 2022-07-18 ENCOUNTER — Other Ambulatory Visit: Payer: Self-pay | Admitting: Cardiovascular Disease

## 2022-07-18 DIAGNOSIS — E1165 Type 2 diabetes mellitus with hyperglycemia: Secondary | ICD-10-CM

## 2022-08-06 DIAGNOSIS — K8020 Calculus of gallbladder without cholecystitis without obstruction: Secondary | ICD-10-CM | POA: Diagnosis not present

## 2022-08-14 ENCOUNTER — Ambulatory Visit (INDEPENDENT_AMBULATORY_CARE_PROVIDER_SITE_OTHER): Payer: Medicare Other | Admitting: Podiatry

## 2022-08-14 ENCOUNTER — Ambulatory Visit
Admission: RE | Admit: 2022-08-14 | Discharge: 2022-08-14 | Disposition: A | Discharge status: Home / Self Care | Payer: Medicare Other | Source: Ambulatory Visit | Attending: Family Medicine | Admitting: Family Medicine

## 2022-08-14 DIAGNOSIS — L84 Corns and callosities: Secondary | ICD-10-CM

## 2022-08-14 DIAGNOSIS — K828 Other specified diseases of gallbladder: Secondary | ICD-10-CM | POA: Diagnosis not present

## 2022-08-14 DIAGNOSIS — I2510 Atherosclerotic heart disease of native coronary artery without angina pectoris: Secondary | ICD-10-CM | POA: Diagnosis not present

## 2022-08-14 DIAGNOSIS — R160 Hepatomegaly, not elsewhere classified: Secondary | ICD-10-CM

## 2022-08-14 DIAGNOSIS — I700 Atherosclerosis of aorta: Secondary | ICD-10-CM | POA: Diagnosis not present

## 2022-08-14 DIAGNOSIS — E1142 Type 2 diabetes mellitus with diabetic polyneuropathy: Secondary | ICD-10-CM | POA: Diagnosis not present

## 2022-08-14 DIAGNOSIS — I517 Cardiomegaly: Secondary | ICD-10-CM | POA: Diagnosis not present

## 2022-08-14 DIAGNOSIS — I739 Peripheral vascular disease, unspecified: Secondary | ICD-10-CM

## 2022-08-14 MED ORDER — IOPAMIDOL (ISOVUE-300) INJECTION 61%
100.0000 mL | Freq: Once | INTRAVENOUS | Status: AC | PRN
  Administered 2022-08-14: 100 mL via INTRAVENOUS

## 2022-08-17 ENCOUNTER — Other Ambulatory Visit: Payer: Self-pay | Admitting: Cardiovascular Disease

## 2022-08-21 NOTE — Progress Notes (Signed)
Subjective: Chief Complaint  Patient presents with   Diabetes    Diabetic foot care, A1c-7.5 BG-65    81 year old female presents the office today for callus on the right foot that need to be trimmed.  Denies any swelling redness or any drainage.  She states that the inserts did cause some irritation on the left and there is a dried blood blister present.  No fevers or chills.  Objective: AAO x3, NAD DP/PT pulses palpable bilaterally, CRT less than 3 seconds Status post bilateral transmetatarsal amputations.  On the distal medial aspect on the right side there is a hyperkeratotic lesion but upon debridement there is no underlying ulceration drainage or any signs of infection.  The distal aspect of amputation site on the left side appears to be an old dried hemorrhagic blister, callus.  Upon debridement underlying skin intact and new, healthy tissue present there is no skin breakdown, drainage or pus or signs of infection.  There is no open lesions bilaterally. No pain with calf compression, swelling, warmth, erythema  Assessment: Preulcerative callus right foot  Plan: -All treatment options discussed with the patient including all alternatives, risks, complications.  -Sharply debrided hyperkeratotic lesions x 2 without any complications or bleeding.  Recommend moisturizer, offloading daily. -Patient encouraged to call the office with any questions, concerns, change in symptoms.   Trula Slade DPM

## 2022-08-22 DIAGNOSIS — E113393 Type 2 diabetes mellitus with moderate nonproliferative diabetic retinopathy without macular edema, bilateral: Secondary | ICD-10-CM | POA: Diagnosis not present

## 2022-08-22 DIAGNOSIS — Z0000 Encounter for general adult medical examination without abnormal findings: Secondary | ICD-10-CM | POA: Diagnosis not present

## 2022-08-22 DIAGNOSIS — I10 Essential (primary) hypertension: Secondary | ICD-10-CM | POA: Diagnosis not present

## 2022-08-22 DIAGNOSIS — M8580 Other specified disorders of bone density and structure, unspecified site: Secondary | ICD-10-CM | POA: Diagnosis not present

## 2022-08-22 DIAGNOSIS — E1151 Type 2 diabetes mellitus with diabetic peripheral angiopathy without gangrene: Secondary | ICD-10-CM | POA: Diagnosis not present

## 2022-08-22 DIAGNOSIS — I739 Peripheral vascular disease, unspecified: Secondary | ICD-10-CM | POA: Diagnosis not present

## 2022-08-22 DIAGNOSIS — I70723 Atherosclerosis of other type of bypass graft(s) of the extremities with rest pain, bilateral legs: Secondary | ICD-10-CM | POA: Diagnosis not present

## 2022-08-22 DIAGNOSIS — D649 Anemia, unspecified: Secondary | ICD-10-CM | POA: Diagnosis not present

## 2022-08-22 DIAGNOSIS — Z89439 Acquired absence of unspecified foot: Secondary | ICD-10-CM | POA: Diagnosis not present

## 2022-08-22 DIAGNOSIS — E7800 Pure hypercholesterolemia, unspecified: Secondary | ICD-10-CM | POA: Diagnosis not present

## 2022-08-22 DIAGNOSIS — K769 Liver disease, unspecified: Secondary | ICD-10-CM | POA: Diagnosis not present

## 2022-08-22 DIAGNOSIS — K829 Disease of gallbladder, unspecified: Secondary | ICD-10-CM | POA: Diagnosis not present

## 2022-08-24 ENCOUNTER — Other Ambulatory Visit: Payer: Medicare Other

## 2022-08-28 ENCOUNTER — Other Ambulatory Visit (INDEPENDENT_AMBULATORY_CARE_PROVIDER_SITE_OTHER): Payer: Medicare Other

## 2022-08-28 DIAGNOSIS — E1165 Type 2 diabetes mellitus with hyperglycemia: Secondary | ICD-10-CM

## 2022-08-28 LAB — HEMOGLOBIN A1C: Hgb A1c MFr Bld: 7.9 % — ABNORMAL HIGH (ref 4.6–6.5)

## 2022-08-28 LAB — BASIC METABOLIC PANEL
BUN: 15 mg/dL (ref 6–23)
CO2: 28 mEq/L (ref 19–32)
Calcium: 9.6 mg/dL (ref 8.4–10.5)
Chloride: 103 mEq/L (ref 96–112)
Creatinine, Ser: 0.86 mg/dL (ref 0.40–1.20)
GFR: 63.79 mL/min (ref >60.00)
Glucose, Bld: 167 mg/dL — ABNORMAL HIGH (ref 70–99)
Potassium: 4 mEq/L (ref 3.5–5.1)
Sodium: 141 mEq/L (ref 135–145)

## 2022-08-28 LAB — MICROALBUMIN / CREATININE URINE RATIO
Creatinine,U: 35.3 mg/dL
Microalb Creat Ratio: 54 mg/g — ABNORMAL HIGH (ref 0.0–30.0)
Microalb, Ur: 19.1 mg/dL — ABNORMAL HIGH (ref 0.0–1.9)

## 2022-08-29 ENCOUNTER — Other Ambulatory Visit (HOSPITAL_COMMUNITY): Payer: Self-pay | Admitting: Surgery

## 2022-08-29 DIAGNOSIS — K7689 Other specified diseases of liver: Secondary | ICD-10-CM

## 2022-08-29 DIAGNOSIS — K8020 Calculus of gallbladder without cholecystitis without obstruction: Secondary | ICD-10-CM | POA: Diagnosis not present

## 2022-08-29 DIAGNOSIS — R160 Hepatomegaly, not elsewhere classified: Secondary | ICD-10-CM | POA: Diagnosis not present

## 2022-08-30 ENCOUNTER — Ambulatory Visit (HOSPITAL_COMMUNITY)
Admission: RE | Admit: 2022-08-30 | Discharge: 2022-08-30 | Disposition: A | Discharge status: Home / Self Care | Payer: Medicare Other | Source: Ambulatory Visit | Attending: Surgery | Admitting: Surgery

## 2022-08-30 ENCOUNTER — Ambulatory Visit: Payer: Medicare Other | Admitting: Endocrinology

## 2022-08-30 DIAGNOSIS — K7689 Other specified diseases of liver: Secondary | ICD-10-CM | POA: Diagnosis not present

## 2022-08-30 DIAGNOSIS — K828 Other specified diseases of gallbladder: Secondary | ICD-10-CM | POA: Diagnosis not present

## 2022-08-30 MED ORDER — GADOXETATE DISODIUM 0.25 MMOL/ML IV SOLN
5.0000 mL | Freq: Once | INTRAVENOUS | Status: AC | PRN
  Administered 2022-08-30: 5 mL via INTRAVENOUS

## 2022-09-05 ENCOUNTER — Telehealth: Payer: Self-pay

## 2022-09-05 NOTE — Telephone Encounter (Signed)
   Pre-operative Risk Assessment    Patient Name: [name]  DOB: 1941/02/01 MRN: [id number]      Request for Surgical Clearance    Procedure:   LAP CHOLE  Date of Surgery:  Clearance TBD                                 Surgeon:  Michaelle Birks, MD  ATTN:LAURA ADKINS Surgeon's Group or Practice Name:  [facility]  Phone number:  [phone] Fax number:  [phone]    Type of Clearance Requested:   - Medical    Type of Anesthesia:  General    Additional requests/questions:    [name]   09/05/2022, 2:43 PM

## 2022-09-05 NOTE — Telephone Encounter (Signed)
   Name: [name]  DOB: 1940-10-21  MRN: [id number]  Primary Cardiologist: [name], MD  Chart reviewed as part of pre-operative protocol coverage. Because of [name]'s past medical history and time since last visit, she will require a follow-up in-office visit in order to better assess preoperative cardiovascular risk.  Pre-op covering staff: - Please schedule appointment and call patient to inform them. If patient already had an upcoming appointment within acceptable timeframe, please add "pre-op clearance" to the appointment notes so provider is aware. - Please contact requesting surgeon's office via preferred method (i.e, phone, fax) to inform them of need for appointment prior to surgery.  No medication indicated as needing to be held.   Elgie Collard, PA-C  09/05/2022, 4:06 PM

## 2022-09-06 NOTE — Telephone Encounter (Signed)
Pt scheduled to see Diona Browner, NP for preop clearance on 09/13/22

## 2022-09-10 ENCOUNTER — Ambulatory Visit: Payer: Medicare Other | Admitting: Endocrinology

## 2022-09-10 ENCOUNTER — Encounter: Payer: Self-pay | Admitting: *Deleted

## 2022-09-10 ENCOUNTER — Ambulatory Visit (INDEPENDENT_AMBULATORY_CARE_PROVIDER_SITE_OTHER): Payer: Medicare Other | Admitting: Endocrinology

## 2022-09-10 VITALS — BP 156/56 | HR 63 | Ht 65.0 in | Wt 144.8 lb

## 2022-09-10 DIAGNOSIS — E1165 Type 2 diabetes mellitus with hyperglycemia: Secondary | ICD-10-CM

## 2022-09-10 DIAGNOSIS — R809 Proteinuria, unspecified: Secondary | ICD-10-CM | POA: Diagnosis not present

## 2022-09-10 DIAGNOSIS — I6523 Occlusion and stenosis of bilateral carotid arteries: Secondary | ICD-10-CM

## 2022-09-10 DIAGNOSIS — E1129 Type 2 diabetes mellitus with other diabetic kidney complication: Secondary | ICD-10-CM | POA: Diagnosis not present

## 2022-09-10 DIAGNOSIS — I10 Essential (primary) hypertension: Secondary | ICD-10-CM | POA: Diagnosis not present

## 2022-09-10 NOTE — Progress Notes (Signed)
Patient ID: [name], female   DOB: 1940/12/10, 81 y.o.   MRN: [id number]          Reason for Appointment: Follow-up for endocrinology problems   History of Present Illness:          Date of diagnosis of type 2 diabetes mellitus: 1984       Background history:   She has been on metformin since about the time of her diagnosis and has been on other drugs also but has not had many changes in her regimen in the last several years She thinks her blood sugar used to be fairly well controlled At some point she was given Byetta for about a year and a half which improved her control but this was subsequently start because of it being too expensive for her.  She had no side effects with this  Recent history:   Her A1c is increased at 7.9 %  Non-insulin hypoglycemic regimen: Amaryl 1 mg in a.m. metformin 1 g daily,   Insulin: Humalog mix 8 units before breakfast  Current management, level of control, blood sugar patterns and problems identified:  She was told to start insulin in September but she only started this about 6 weeks ago, had difficulties with prior authorization when Novolog Mix was prescribed She appears to have stopped her Prandin and Jardiance and misunderstood her instructions She still appears to have some high readings after meals especially after breakfast and likely after dinner Has no difficulty doing her insulin injections in the mornings As before her fasting readings are fairly good She is  not able to exercise No hypoglycemia Weight is about the same recently        Side effects from medications have been: None  Hypoglycemia:   None       Blood Glucose data from One Touch meter download:   PRE-MEAL Fasting Midday Dinner Bedtime Overall  Glucose range: 79-155 82-272   79-272  Mean/median: 105 188   137   POST-MEAL PC Breakfast PC Lunch PC Dinner  Glucose range:  184 196, 233  Mean/median:      Prior data    PRE-MEAL Fasting Lunch Dinner Bedtime  Overall  Glucose range: 89-209 117-195  130, 166   Mean/median: 124 174   160   POST-MEAL PC Breakfast PC Lunch PC Dinner  Glucose range:  200, 275   Mean/median:        Self-care: The diet that the patient has been following is: tries to limit sweets and fried food.     Meal times are:  Breakfast is at 8:30 AM lunch: 1 PM, dinner: 6:30 PM  Typical meal intake: Breakfast is yogurt/cereal with fruit or egg/toast or oatmeal Sometimes drinking boost  Lunch is usually soup with crackers, dinner is usually a low-fat meat with vegetables Snacks will be celery and ranch dressing               Dietician visit, most recent: 10/18                Weight history:  Wt Readings from Last 3 Encounters:  09/10/22 144 lb 12.8 oz (65.7 kg)  06/28/22 140 lb (63.5 kg)  06/13/22 140 lb 9.6 oz (63.8 kg)    Glycemic control:   Lab Results  Component Value Date   HGBA1C 7.9 (H) 08/28/2022   HGBA1C 8.0 (H) 06/06/2022   HGBA1C 6.8 (H) 02/01/2022   Lab Results  Component Value Date   MICROALBUR 19.1 (  H) 08/28/2022   LDLCALC 50 10/14/2021   CREATININE 0.86 08/28/2022   Lab Results  Component Value Date   MICRALBCREAT 54.0 (H) 08/28/2022    Lab Results  Component Value Date   FRUCTOSAMINE 261 11/28/2021   FRUCTOSAMINE 275 09/29/2021   FRUCTOSAMINE 237 03/29/2021   Lab Results  Component Value Date   HGB 12.7 06/28/2022      Allergies as of 09/10/2022       Reactions   Augmentin [amoxicillin-pot Clavulanate] Other (See Comments)   Elevates potassium level   Bactrim [sulfamethoxazole-trimethoprim] Other (See Comments)   ^ K+( elevated)    Demerol [meperidine]    Delusional    Meperidine Hcl    Other reaction(s): delusional   Scopolamine    Delusional    Tramadol Other (See Comments)   Keeps awake   Versed [midazolam] Anxiety   Frantic, out of my mind, agitated         Medication List        Accurate as of September 10, 2022  8:24 PM. If you have any questions,  ask your nurse or doctor.          STOP taking these medications    NovoLOG Mix 70/30 FlexPen (70-30) 100 UNIT/ML FlexPen Generic drug: insulin aspart protamine - aspart Stopped by: Elayne Snare, MD       TAKE these medications    atorvastatin 80 MG tablet Commonly known as: LIPITOR TAKE 1 TABLET BY MOUTH ONCE DAILY. MUST  KEEP  UPCOMING  APPOINTMENT  FOR  FUTURE  REFILLS What changed: See the new instructions.   brimonidine 0.2 % ophthalmic solution Commonly known as: ALPHAGAN Place 1 drop into the right eye 2 (two) times daily.   CINNAMON PO Take 2 tablets by mouth in the morning, at noon, and at bedtime.   ciprofloxacin 500 MG tablet Commonly known as: CIPRO Take 1 tablet (500 mg total) by mouth every 12 (twelve) hours.   [employer] SENIOR PROBIOTIC PO Take 1 capsule by mouth every evening.   glimepiride 1 MG tablet Commonly known as: AMARYL TAKE 2 TABLETS BY MOUTH ONCE DAILY BEFORE BREAKFAST   hydrochlorothiazide 12.5 MG tablet Commonly known as: HYDRODIURIL Take 1 tablet by mouth once daily   Insulin Lispro Prot & Lispro (75-25) 100 UNIT/ML Kwikpen Commonly known as: HumaLOG Mix 75/25 KwikPen Inject 15 Units into the skin daily with breakfast. What changed:  how much to take how to take this when to take this additional instructions   Insulin Pen Needle 31G X 5 MM Misc Use for insulin pens   Jardiance 25 MG Tabs tablet Generic drug: empagliflozin TAKE 1 TABLET BY MOUTH ONCE DAILY BEFORE BREAKFAST What changed:  how much to take when to take this   metFORMIN 1000 MG tablet Commonly known as: GLUCOPHAGE Take 1 tablet by mouth twice daily   metoprolol tartrate 25 MG tablet Commonly known as: LOPRESSOR TAKE 1 & 1/2 (ONE & ONE-HALF) TABLETS BY MOUTH TWICE DAILY   metroNIDAZOLE 500 MG tablet Commonly known as: FLAGYL Take 1 tablet (500 mg total) by mouth 2 (two) times daily.   OneTouch Delica Plus ALPFXT02I Misc Use to check blood sugars twice  daily.   OneTouch Verio test strip Generic drug: glucose blood USE TO CHECK BLOOD SUGARS ONCE DAILY   pantoprazole 40 MG tablet Commonly known as: PROTONIX Take 1 tablet by mouth once daily   pyridoxine 100 MG tablet Commonly known as: B-6 Take 100 mg by mouth every evening.  repaglinide 2 MG tablet Commonly known as: PRANDIN TAKE 1 TABLET BY MOUTH ONCE DAILY BEFORE BREAKFAST   Systane Balance 0.6 % Soln Generic drug: Propylene Glycol Place 1 drop into the left eye 3 (three) times daily as needed (dry/irritated eyes).   telmisartan 80 MG tablet Commonly known as: MICARDIS Take 1 tablet by mouth once daily   Xarelto 2.5 MG Tabs tablet Generic drug: rivaroxaban Take 1 tablet by mouth twice daily What changed: how much to take        Allergies:  Allergies  Allergen Reactions   Augmentin [Amoxicillin-Pot Clavulanate] Other (See Comments)    Elevates potassium level   Bactrim [Sulfamethoxazole-Trimethoprim] Other (See Comments)    ^ K+( elevated)    Demerol [Meperidine]     Delusional    Meperidine Hcl     Other reaction(s): delusional   Scopolamine     Delusional    Tramadol Other (See Comments)    Keeps awake   Versed [Midazolam] Anxiety    Frantic, out of my mind, agitated     Past Medical History:  Diagnosis Date   Anemia    after CABG in june 2021   Arthritis    Cancer ([place])    removal from nose - MOSE procedure    Complication of anesthesia    VERSED- agitated, muscle spasms, "jerking" , frantic , (never had this occurence in the pas)    Coronary artery disease    Diabetes mellitus without complication ([place])    Dysrhythmia    PVC's   GERD (gastroesophageal reflux disease)    Heart murmur    History of hiatal hernia    Hyperlipidemia    Hypertension 11/20/11   ECHO- EF>55% Borderline concentric left ventricular hypertrophy. There is a small calcified mass in the L:A near the LA appendage. No valvular masses seen with associated mitral annular  calcification. LA Volume/ BSA27.4 ml/m2 No AS. Right ventricular systolic pressure is elevated at 36mHg.   Myocardial infarction ([facility]    June 2021   [place]rom[place]    neuropathy in bilateral feet   Peripheral vascular disease ([place]    Pneumonia    not hosp.    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  PTCAs with drug coated balloon Lt ant tibial artery and Lt popliteal artery  11/26/2019    Past Surgical History:  Procedure Laterality Date   ABDOMINAL AORTAGRAM  11/25/2019   ABDOMINAL AORTOGRAM W/LOWER EXTREMITY    ABDOMINAL AORTOGRAM N/A 06/15/2019   Procedure: ABDOMINAL AORTOGRAM;  Surgeon: BLorretta Harp MD;  [facility];  Service: Cardiovascular;  Laterality: N/A;   ABDOMINAL AORTOGRAM W/LOWER EXTREMITY N/A 11/25/2019   Procedure: ABDOMINAL AORTOGRAM W/LOWER EXTREMITY;  Surgeon: AWellington Hampshire MD;  [facility];  Service: Cardiovascular;  Laterality: N/A;  Lt leg   ABDOMINAL AORTOGRAM W/LOWER EXTREMITY N/A 08/01/2020   Procedure: ABDOMINAL AORTOGRAM W/LOWER EXTREMITY;  Surgeon: BLorretta Harp MD;  [facility];  Service: Cardiovascular;  Laterality: N/A;   ABDOMINAL AORTOGRAM W/LOWER EXTREMITY N/A 04/28/2021   Procedure: ABDOMINAL AORTOGRAM W/LOWER EXTREMITY;  Surgeon: FElam Dutch MD;  [facility];  Service: Cardiovascular;  Laterality: N/A;   ABDOMINAL AORTOGRAM W/LOWER EXTREMITY N/A 10/13/2021   Procedure: ABDOMINAL AORTOGRAM W/LOWER EXTREMITY;  Surgeon: HCherre Robins MD;  [facility];  Service: Cardiovascular;  Laterality: N/A;   AMPUTATION TOE Left 05/03/2021   Procedure: AMPUTATION OF THIRD  LEFT  TOE;  Surgeon: [name], MD;  Location: [facility];  Service: Vascular;  Laterality: Left;   APPENDECTOMY     APPLICATION OF WOUND VAC Right 07/21/2019   Procedure: Application Of Prevena Wound Vac Right Groin;  Surgeon: [name], MD;  [facility];   Service: Vascular;  Laterality: Right;   CARDIAC CATHETERIZATION     CARPAL [place]     x 2   COLONOSCOPY     CORONARY ARTERY BYPASS GRAFT N/A 03/24/2020   Procedure: CORONARY ARTERY BYPASS GRAFTING (CABG) using LIMA to LAD (m); RIMA to RAMUS; Endoscopic Right Greater Saphenous Vein: SVG to Diag1; SVG to PLB (right); and SVG to PL (left).;  Surgeon: [name], MD;  Location: [place];  Service: Open Heart Surgery;  Laterality: N/A;  BILATERAL IMA   ENDARTERECTOMY FEMORAL Right 07/21/2019   Procedure: RIGHT ENDARTERECTOMY FEMORAL WITH PATCH ANGIOPLASTY;  Surgeon: [name], MD;  Location: [place];  Service: Vascular;  Laterality: Right;   ENDARTERECTOMY FEMORAL Right 08/16/2020   Procedure: RIGHT FEMORAL ENDARTERECTOMY;  Surgeon: [name], MD;  Location: [place];  Service: Vascular;  Laterality: Right;   ENDOVEIN HARVEST OF GREATER SAPHENOUS VEIN Right 03/24/2020   Procedure: ENDOVEIN HARVEST OF GREATER SAPHENOUS VEIN;  Surgeon: [name], MD;  Location: [place];  Service: Open Heart Surgery;  Laterality: Right;   EYE SURGERY     cataract removal bilaterally   FEMORAL-POPLITEAL BYPASS GRAFT Right 08/16/2020   Procedure: BYPASS GRAFT FEMORAL-POPLITEAL ARTERY;  Surgeon: [name], MD;  Location: [place];  Service: Vascular;  Laterality: Right;   FEMORAL-POPLITEAL BYPASS GRAFT Left 05/03/2021   Procedure: LEFT FEMORAL TO BELOW KNEE POPLITEAL ARTERY BYPASS GRAFTING WITH 6MMX80 PTFE GRAFT;  Surgeon: [name], MD;  Location: [place];  Service: Vascular;  Laterality: Left;   INTRAVASCULAR LITHOTRIPSY  11/25/2019   Procedure: INTRAVASCULAR LITHOTRIPSY;  Surgeon: [name], MD;  Location: [place] [facility];  Service: Cardiovascular;;  LT. SFA   LEFT HEART CATH AND CORONARY ANGIOGRAPHY N/A 03/22/2020   Procedure: LEFT HEART CATH AND CORONARY ANGIOGRAPHY;  Surgeon: Martinique, Peter M, MD;  Location: [place] [facility];  Service: Cardiovascular;  Laterality: N/A;   LOWER EXTREMITY ANGIOGRAPHY Bilateral 06/15/2019   Procedure: Lower Extremity Angiography;  Surgeon: [name], MD;  Location: [place] [facility];  Service: Cardiovascular;  Laterality: Bilateral;   LOWER EXTREMITY ANGIOGRAPHY Right 08/03/2019   Procedure: LOWER EXTREMITY ANGIOGRAPHY;  Surgeon: [name], MD;  Location: [place] [facility];  Service: Cardiovascular;  Laterality: Right;   LOWER EXTREMITY ANGIOGRAPHY N/A 09/07/2021   Procedure: LOWER EXTREMITY ANGIOGRAPHY;  Surgeon: Marty Heck, MD;  Location: [place] [facility];  Service: Cardiovascular;  Laterality: N/A;   PATCH ANGIOPLASTY Right 07/21/2019   Procedure: Patch Angioplasty Right Femoral Artery;  Surgeon: [name], MD;  Location: [place];  Service: Vascular;  Laterality: Right;   PERIPHERAL VASCULAR ATHERECTOMY  08/03/2019   Procedure: PERIPHERAL VASCULAR ATHERECTOMY;  Surgeon: [name], MD;  Location: [place] [facility];  Service: Cardiovascular;;  right SFA, right TP trunk   PERIPHERAL VASCULAR ATHERECTOMY  11/25/2019   Procedure: PERIPHERAL VASCULAR ATHERECTOMY;  Surgeon: [name], MD;  Location: [place] [facility];  Service: Cardiovascular;;  Lt.  POPLITEAL and AT   PERIPHERAL VASCULAR BALLOON ANGIOPLASTY Left 06/15/2019  Procedure: PERIPHERAL VASCULAR BALLOON ANGIOPLASTY;  Surgeon: [name], MD;  Location: [place] [facility];  Service: Cardiovascular;  Laterality: Left;  SFA UNSUCCESSFUL UNABLE TO CROSS LESION   PERIPHERAL VASCULAR BALLOON ANGIOPLASTY  08/03/2019   Procedure: PERIPHERAL VASCULAR BALLOON ANGIOPLASTY;  Surgeon: [name], MD;  Location: [place] [facility];  Service: Cardiovascular;;  right SFA, Right TP trunk   PERIPHERAL VASCULAR BALLOON ANGIOPLASTY  09/07/2021   Procedure: PERIPHERAL VASCULAR BALLOON ANGIOPLASTY;  Surgeon: Marty Heck, MD;  Location: [place] [facility];  Service:  Cardiovascular;;   PERIPHERAL VASCULAR BALLOON ANGIOPLASTY Right 10/13/2021   Procedure: PERIPHERAL VASCULAR BALLOON ANGIOPLASTY;  Surgeon: [name], MD;  Location: [place] [facility];  Service: Cardiovascular;  Laterality: Right;   TEE WITHOUT CARDIOVERSION N/A 03/24/2020   Procedure: TRANSESOPHAGEAL ECHOCARDIOGRAM (TEE);  Surgeon: [name], MD;  Location: [place];  Service: Open Heart Surgery;  Laterality: N/A;   TONSILLECTOMY     and adenoidectomy   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/2019   Procedure: TRANSMETATARSAL AMPUTATION;  Surgeon: Trula Slade, DPM;  Location: [place];  Service: Podiatry;  Laterality: Right;   TRANSMETATARSAL AMPUTATION Left 05/11/2021   Procedure: LEFT TRANSMETATARSAL AMPUTATION;  Surgeon: [name], MD;  Location: [facility] OR;  Service: Vascular;  Laterality: Left;   TUBAL LIGATION      Family History  Problem Relation Age of Onset   Cancer - Prostate Father    Cancer - Colon Father    Stroke Mother    Hypertension Mother    Hyperlipidemia Mother    Melanoma Brother     Social History:  reports that she quit smoking about 50 years ago. Her smoking use included cigarettes. She has never used smokeless tobacco. She reports that she does not drink alcohol and does not use drugs.   Review of Systems   Lipid history: Hypercholesterolemia treated with 80 mg Lipitor    Lab Results  Component Value Date   CHOL 97 10/14/2021   HDL 29 (L) 10/14/2021   LDLCALC 50 10/14/2021   TRIG 89 10/14/2021   CHOLHDL 3.3 10/14/2021           Hypertension: Present for several years, currently treated with  Micardis 80 mg, metoprolol, and HCTZ, also followed by cardiologist  Also checks blood pressure at home  BP Readings from Last 3 Encounters:  09/10/22 (!) 156/56  06/28/22 (!) 166/48  06/13/22 (!) 140/72   Renal function history:  Lab Results  Component Value Date   CREATININE 0.86 08/28/2022   CREATININE 1.06 (H) 06/28/2022    CREATININE 0.92 06/06/2022     Most recent eye exam was in 11/2021   She has had significant PAD followed by vascular cardiologist No recent foot infections, followed by podiatrist  LABS:  No visits with results within 1 Week(s) from this visit.  Latest known visit with results is:  Lab on 08/28/2022  Component Date Value Ref Range Status   Microalb, Ur 08/28/2022 19.1 (H)  0.0 - 1.9 mg/dL Final   Creatinine,U 08/28/2022 35.3  mg/dL Final   Microalb Creat Ratio 08/28/2022 54.0 (H)  0.0 - 30.0 mg/g Final   Sodium 08/28/2022 141  135 - 145 mEq/L Final   Potassium 08/28/2022 4.0  3.5 - 5.1 mEq/L Final   Chloride 08/28/2022 103  96 - 112 mEq/L Final   CO2 08/28/2022 28  19 - 32 mEq/L Final   Glucose, Bld 08/28/2022 167 (H)  70 - 99 mg/dL  Final   BUN 08/28/2022 15  6 - 23 mg/dL Final   Creatinine, Ser 08/28/2022 0.86  0.40 - 1.20 mg/dL Final   GFR 08/28/2022 63.79  >60.00 mL/min Final   Calculated using the CKD-EPI Creatinine Equation (2021)   Calcium 08/28/2022 9.6  8.4 - 10.5 mg/dL Final   Hgb A1c MFr Bld 08/28/2022 7.9 (H)  4.6 - 6.5 % Final   Glycemic Control Guidelines for People with Diabetes:Non Diabetic:  <6%Goal of Therapy: <7%Additional Action Suggested:  >8%     Physical Examination:  BP (!) 156/56   Pulse 63   Ht '5\' 5"'$  (1.651 m)   Wt 144 lb 12.8 oz (65.7 kg)   SpO2 96%   BMI 24.10 kg/m       ASSESSMENT:  Diabetes type 2 non-insulin-dependent  See history of present illness for detailed discussion of current diabetes management, blood sugar patterns and problems identified  She is on premixed insulin in the morning, metformin and Amaryl 1 mg  Her A1c is still about the same  She has not taken her insulin for more than about 6 weeks and likely A1c has not improved as yet Although she has checked her blood sugars periodically after breakfast and dinner most of her readings are still in the morning  HYPERTENSION: again tends to have high readings in the  office but better at home, to continue follow-up with cardiology  Microalbuminuria: This appears somewhat new  PLAN:    No change in the premixed insulin in the morning  Restart Jardiance for multiple benefits including renal benefits especially with her microalbumin going up Jardiance should also be improving her control throughout the day as well as blood pressure  Also start Prandin 2 mg at dinnertime to cover her evening meal However if blood sugars after dinner are continuously higher will consider mealtime insulin  Likely can stop Amaryl now  She is interested in the CGM but about the use of her test trips first She will call if she has persistently high readings after meals   Patient Instructions  Check blood sugars on waking up 2-3 days a week  Also check blood sugars about 2 hours after meals and do this after different meals by rotation  Recommended blood sugar levels on waking up are 90-130 and about 2 hours after meal is 130-180  Please bring your blood sugar monitor to each visit, thank you  Stop Glimeperide  Restart Jardiance  Also  take Repaglanide BEFORE dinner unless not eating Carbs  Call before refilling test strips   Total visit time including counseling = 30 minutes  Elayne Snare 09/10/2022, 8:24 PM   Note: This office note was prepared with Dragon voice recognition system technology. Any transcriptional errors that result from this process are unintentional.

## 2022-09-10 NOTE — Telephone Encounter (Signed)
This encounter was created in error - please disregard.

## 2022-09-10 NOTE — Patient Instructions (Addendum)
Check blood sugars on waking up 2-3 days a week  Also check blood sugars about 2 hours after meals and do this after different meals by rotation  Recommended blood sugar levels on waking up are 90-130 and about 2 hours after meal is 130-180  Please bring your blood sugar monitor to each visit, thank you  Stop Glimeperide  Restart Jardiance  Also  take Repaglanide BEFORE dinner unless not eating Carbs  Call before refilling test strips

## 2022-09-13 ENCOUNTER — Encounter: Payer: Self-pay | Admitting: Nurse Practitioner

## 2022-09-13 ENCOUNTER — Ambulatory Visit: Payer: Medicare Other | Attending: Nurse Practitioner | Admitting: Nurse Practitioner

## 2022-09-13 VITALS — BP 138/68 | HR 78 | Ht 65.0 in | Wt 145.0 lb

## 2022-09-13 DIAGNOSIS — I6523 Occlusion and stenosis of bilateral carotid arteries: Secondary | ICD-10-CM | POA: Diagnosis not present

## 2022-09-13 DIAGNOSIS — Z794 Long term (current) use of insulin: Secondary | ICD-10-CM | POA: Insufficient documentation

## 2022-09-13 DIAGNOSIS — I2510 Atherosclerotic heart disease of native coronary artery without angina pectoris: Secondary | ICD-10-CM

## 2022-09-13 DIAGNOSIS — I10 Essential (primary) hypertension: Secondary | ICD-10-CM | POA: Diagnosis not present

## 2022-09-13 DIAGNOSIS — E119 Type 2 diabetes mellitus without complications: Secondary | ICD-10-CM | POA: Diagnosis not present

## 2022-09-13 DIAGNOSIS — E785 Hyperlipidemia, unspecified: Secondary | ICD-10-CM | POA: Insufficient documentation

## 2022-09-13 DIAGNOSIS — I739 Peripheral vascular disease, unspecified: Secondary | ICD-10-CM | POA: Diagnosis not present

## 2022-09-13 DIAGNOSIS — Z01810 Encounter for preprocedural cardiovascular examination: Secondary | ICD-10-CM | POA: Diagnosis not present

## 2022-09-13 NOTE — Progress Notes (Signed)
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  Past Medical History    Past Medical History:  Diagnosis Date   Anemia    after CABG in june 2021   Arthritis    Cancer ([place])    removal from nose - MOSE procedure    Complication of anesthesia    VERSED- agitated, muscle spasms, "jerking" , frantic , (never had this occurence in the pas)    Coronary artery disease    Diabetes mellitus without complication ([place])    Dysrhythmia    PVC's   GERD (gastroesophageal reflux disease)    Heart murmur    History of hiatal hernia    Hyperlipidemia    Hypertension 11/20/11   ECHO- EF>55% Borderline concentric left ventricular hypertrophy. There is a small calcified mass in the L:A near the LA appendage. No valvular masses seen with associated mitral annular calcification. LA Volume/ BSA27.4 ml/m2 No AS. Right ventricular systolic pressure is elevated at 22mHg.   Myocardial infarction ([facility]    June 2021   Neuromuscular disorder ([place]    neuropathy in bilateral feet   Peripheral vascular disease ([place]    Pneumonia    not hosp.    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  PTCAs with drug coated balloon Lt ant tibial artery and Lt popliteal artery  11/26/2019   Past Surgical History:  Procedure Laterality Date   ABDOMINAL AORTAGRAM  11/25/2019   ABDOMINAL AORTOGRAM W/LOWER EXTREMITY    ABDOMINAL AORTOGRAM N/A 06/15/2019   Procedure: ABDOMINAL AORTOGRAM;  Surgeon: BLorretta Harp MD;  [facility];  Service: Cardiovascular;  Laterality: N/A;   ABDOMINAL AORTOGRAM W/LOWER EXTREMITY N/A 11/25/2019   Procedure: ABDOMINAL AORTOGRAM W/LOWER EXTREMITY;  Surgeon: AWellington Hampshire MD;  [place] [facility];  Service: Cardiovascular;  Laterality: N/A;  Lt leg   ABDOMINAL AORTOGRAM W/LOWER EXTREMITY N/A 08/01/2020   Procedure: ABDOMINAL AORTOGRAM W/LOWER EXTREMITY;  Surgeon: BLorretta Harp MD;  [facility];  Service: Cardiovascular;  Laterality: N/A;   ABDOMINAL AORTOGRAM W/LOWER EXTREMITY N/A 04/28/2021   Procedure: ABDOMINAL AORTOGRAM W/LOWER EXTREMITY;  Surgeon: FElam Dutch MD;  [place];  Service: Cardiovascular;  Laterality: N/A;   ABDOMINAL AORTOGRAM W/LOWER EXTREMITY N/A 10/13/2021   Procedure: ABDOMINAL AORTOGRAM W/LOWER EXTREMITY;  Surgeon: HCherre Robins MD;  [facility];  Service: Cardiovascular;  Laterality: N/A;   AMPUTATION TOE Left 05/03/2021   Procedure: AMPUTATION OF THIRD LEFT  TOE;  Surgeon: HCherre Robins MD;  [facility];  Service: Vascular;  Laterality: Left;   APPENDECTOMY     APPLICATION OF WOUND VAC Right 07/21/2019   Procedure: Application Of Prevena Wound Vac Right Groin;  Surgeon: BSerafina Mitchell MD;  [facility];  Service: Vascular;  Laterality: Right;   CARDIAC CATHETERIZATION     CARPAL TUNNEL RELEASE     CARPAL TUNNEL RELEASE     CESAREAN SECTION     x 2  COLONOSCOPY     CORONARY ARTERY BYPASS GRAFT N/A 03/24/2020   Procedure: CORONARY ARTERY BYPASS GRAFTING (CABG) using LIMA to LAD (m); RIMA to RAMUS; Endoscopic Right Greater Saphenous Vein: SVG to Diag1; SVG to PLB (right); and SVG to PL (left).;  Surgeon: [name], MD;  Location: [place];  Service: Open Heart Surgery;  Laterality: N/A;  BILATERAL IMA   ENDARTERECTOMY FEMORAL Right 07/21/2019   Procedure: RIGHT ENDARTERECTOMY FEMORAL WITH PATCH ANGIOPLASTY;  Surgeon: [name], MD;  Location: [place];   Service: Vascular;  Laterality: Right;   ENDARTERECTOMY FEMORAL Right 08/16/2020   Procedure: RIGHT FEMORAL ENDARTERECTOMY;  Surgeon: [name], MD;  Location: [place];  Service: Vascular;  Laterality: Right;   ENDOVEIN HARVEST OF GREATER SAPHENOUS VEIN Right 03/24/2020   Procedure: ENDOVEIN HARVEST OF GREATER SAPHENOUS VEIN;  Surgeon: [name], MD;  Location: [place];  Service: Open Heart Surgery;  Laterality: Right;   EYE SURGERY     cataract removal bilaterally   FEMORAL-POPLITEAL BYPASS GRAFT Right 08/16/2020   Procedure: BYPASS GRAFT FEMORAL-POPLITEAL ARTERY;  Surgeon: [name], MD;  Location: [place];  Service: Vascular;  Laterality: Right;   FEMORAL-POPLITEAL BYPASS GRAFT Left 05/03/2021   Procedure: LEFT FEMORAL TO BELOW KNEE POPLITEAL ARTERY BYPASS GRAFTING WITH 6MMX80 PTFE GRAFT;  Surgeon: [name], MD;  Location: [place];  Service: Vascular;  Laterality: Left;   INTRAVASCULAR LITHOTRIPSY  11/25/2019   Procedure: INTRAVASCULAR LITHOTRIPSY;  Surgeon: [name], MD;  Location: [place] [facility];  Service: Cardiovascular;;  LT. SFA   LEFT HEART CATH AND CORONARY ANGIOGRAPHY N/A 03/22/2020   Procedure: LEFT HEART CATH AND CORONARY ANGIOGRAPHY;  Surgeon: Martinique, Peter M, MD;  Location: [place] [facility];  Service: Cardiovascular;  Laterality: N/A;   LOWER EXTREMITY ANGIOGRAPHY Bilateral 06/15/2019   Procedure: Lower Extremity Angiography;  Surgeon: [name], MD;  Location: [place] [facility];  Service: Cardiovascular;  Laterality: Bilateral;   LOWER EXTREMITY ANGIOGRAPHY Right 08/03/2019   Procedure: LOWER EXTREMITY ANGIOGRAPHY;  Surgeon: [name], MD;  Location: [place] [facility];  Service: Cardiovascular;  Laterality: Right;   LOWER EXTREMITY ANGIOGRAPHY N/A 09/07/2021   Procedure: LOWER EXTREMITY ANGIOGRAPHY;  Surgeon: Marty Heck, MD;  Location: [place] [facility];  Service: Cardiovascular;  Laterality: N/A;    PATCH ANGIOPLASTY Right 07/21/2019   Procedure: Patch Angioplasty Right Femoral Artery;  Surgeon: [name], MD;  Location: [place];  Service: Vascular;  Laterality: Right;   PERIPHERAL VASCULAR ATHERECTOMY  08/03/2019   Procedure: PERIPHERAL VASCULAR ATHERECTOMY;  Surgeon: [name], MD;  Location: [place] [facility];  Service: Cardiovascular;;  right SFA, right TP trunk   PERIPHERAL VASCULAR ATHERECTOMY  11/25/2019   Procedure: PERIPHERAL VASCULAR ATHERECTOMY;  Surgeon: [name], MD;  Location: [place] [facility];  Service: Cardiovascular;;  Lt.  POPLITEAL and AT   PERIPHERAL VASCULAR BALLOON ANGIOPLASTY Left 06/15/2019   Procedure: PERIPHERAL VASCULAR BALLOON ANGIOPLASTY;  Surgeon: [name], MD;  Location: [place] [facility];  Service: Cardiovascular;  Laterality: Left;  SFA UNSUCCESSFUL UNABLE TO CROSS LESION   PERIPHERAL VASCULAR BALLOON ANGIOPLASTY  08/03/2019   Procedure: PERIPHERAL VASCULAR BALLOON ANGIOPLASTY;  Surgeon: [name], MD;  Location: [place] [facility];  Service: Cardiovascular;;  right SFA, Right TP trunk   PERIPHERAL VASCULAR BALLOON ANGIOPLASTY  09/07/2021   Procedure: PERIPHERAL VASCULAR BALLOON ANGIOPLASTY;  Surgeon: Marty Heck, MD;  [facility]: [facility];  Service: Cardiovascular;;   PERIPHERAL VASCULAR BALLOON ANGIOPLASTY Right 10/13/2021   Procedure: PERIPHERAL VASCULAR BALLOON ANGIOPLASTY;  Surgeon: [name], MD;  Location: [place] [facility];  Service: Cardiovascular;  Laterality: Right;   TEE WITHOUT CARDIOVERSION N/A 03/24/2020   Procedure: TRANSESOPHAGEAL ECHOCARDIOGRAM (TEE);  Surgeon: [name], MD;  Location: [place];  Service: Open Heart Surgery;  Laterality: N/A;   TONSILLECTOMY     and adenoidectomy   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/2019   Procedure: TRANSMETATARSAL AMPUTATION;  Surgeon: Trula Slade, DPM;  Location: [place];  Service: Podiatry;  Laterality: Right;   TRANSMETATARSAL  AMPUTATION Left 05/11/2021   Procedure: LEFT TRANSMETATARSAL AMPUTATION;  Surgeon: [name], MD;  Location: [facility] OR;  Service: Vascular;  Laterality: Left;   TUBAL LIGATION      Allergies  Allergies  Allergen Reactions   Augmentin [Amoxicillin-Pot Clavulanate] Other (See Comments)    Elevates potassium level   Bactrim [Sulfamethoxazole-Trimethoprim] Other (See Comments)    ^ K+( elevated)    Demerol [Meperidine]     Delusional    Meperidine Hcl     Other reaction(s): delusional   Scopolamine     Delusional    Tramadol Other (See Comments)    Keeps awake   Versed [Midazolam] Anxiety    Frantic, out of my mind, agitated     History of Present Illness    81 year old female with the above past medical history including CAD s/p CABG x 5 in 2021, 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  She was hospitalized in 02/2020 in the setting of a non-STEMI.  Cardiac catheterization revealed severe three-vessel disease and she underwent CABG x 5 in 03/2020.  She has a history of extensive PAD with critical limb ischemia, nonhealing wounds s/p multiple interventions including bilateral TMA's, stenting of right extrailiac artery, and PTCA to right popliteal and peroneal arteries.  Follows with both Dr. Gwenlyn Found and with vascular surgery (Dr. Stanford Breed).  Recent ABIs in 01/2022 showed right ABIs somewhat decreased compared to prior study, left ABIs were essentially unchanged getting moderate bilateral lower extremity arterial disease.  Carotid Dopplers in 11/2021 revealed 40 to 59% R ICA stenosis, 1 to 96% LICA stenosis, and greater than 50% bilateral ECA stenosis. She was last seen in the office on 12/19/2021 and was stable from a cardiac standpoint.  She denied symptoms concerning for angina.  She was evaluated in the ED in October 2023  with epigastric pain.  Troponin was flat, chest x-ray and EKG were unremarkable.  CT of the abdomen and pelvis demonstrated acute diverticulitis.  She was treated with antibiotics.  She presents today for follow-up and for preoperative cardiac evaluation for a laparoscopic cholecystectomy with Dr. Michaelle Birks of [facility] surgery. Since her last visit she has been stable from a cardiac standpoint.  Notes occasional fleeting palpitations related to PVCs, she denies any symptoms concerning for angina, denies worsening claudication, dizziness.  Overall, she reports feeling well.  Home Medications    Current Outpatient Medications  Medication Sig Dispense Refill   atorvastatin (LIPITOR) 80 MG tablet TAKE 1 TABLET BY MOUTH ONCE DAILY. MUST  KEEP  UPCOMING  APPOINTMENT  FOR  FUTURE  REFILLS (Patient taking differently: Take 80 mg by mouth daily.) 90 tablet 3   brimonidine (ALPHAGAN) 0.2 % ophthalmic solution Place 1 drop into the right  eye 2 (two) times daily.     CINNAMON PO Take 2 tablets by mouth in the morning, at noon, and at bedtime.      glimepiride (AMARYL) 1 MG tablet TAKE 2 TABLETS BY MOUTH ONCE DAILY BEFORE BREAKFAST 90 tablet 2   hydrochlorothiazide (HYDRODIURIL) 12.5 MG tablet Take 1 tablet by mouth once daily (Patient taking differently: Take 12.5 mg by mouth daily.) 90 tablet 1   Insulin Lispro Prot & Lispro (HUMALOG MIX 75/25 KWIKPEN) (75-25) 100 UNIT/ML Kwikpen Inject 15 Units into the skin daily with breakfast. (Patient taking differently: Inject 8 Units into the skin daily.) 15 mL 2   Insulin Pen Needle 31G X 5 MM MISC Use for insulin pens 50 each 1   JARDIANCE 25 MG TABS tablet TAKE 1 TABLET BY MOUTH ONCE DAILY BEFORE BREAKFAST (Patient taking differently: Take 25 mg by mouth daily.) 90 tablet 3   Lancets (ONETOUCH DELICA PLUS [id number]) MISC Use to check blood sugars twice daily. 100 each 5   metFORMIN (GLUCOPHAGE) 1000 MG tablet Take 1 tablet by mouth twice daily 180 tablet 3    metoprolol tartrate (LOPRESSOR) 25 MG tablet TAKE 1 & 1/2 (ONE & ONE-HALF) TABLETS BY MOUTH TWICE DAILY 270 tablet 2   ONETOUCH VERIO test strip USE TO CHECK BLOOD SUGARS ONCE DAILY 100 each 2   pantoprazole (PROTONIX) 40 MG tablet Take 1 tablet by mouth once daily (Patient taking differently: Take 40 mg by mouth daily.) 90 tablet 3   Probiotic Product ([employer] SENIOR PROBIOTIC PO) Take 1 capsule by mouth every evening.     Propylene Glycol (SYSTANE BALANCE) 0.6 % SOLN Place 1 drop into the left eye 3 (three) times daily as needed (dry/irritated eyes).     pyridoxine (B-6) 100 MG tablet Take 100 mg by mouth every evening.      repaglinide (PRANDIN) 2 MG tablet TAKE 1 TABLET BY MOUTH ONCE DAILY BEFORE BREAKFAST 90 tablet 2   telmisartan (MICARDIS) 80 MG tablet Take 1 tablet by mouth once daily 90 tablet 3   XARELTO 2.5 MG TABS tablet Take 1 tablet by mouth twice daily (Patient taking differently: Take 2.5 mg by mouth 2 (two) times daily.) 60 tablet 5   Current Facility-Administered Medications  Medication Dose Route Frequency Provider Last Rate Last Admin   sodium chloride flush (NS) 0.9 % injection 3 mL  3 mL Intravenous Q12H [name], MD         Review of Systems    She denies chest pain, palpitations, dyspnea, pnd, orthopnea, n, v, dizziness, syncope, edema, weight gain, or early satiety. All other systems reviewed and are otherwise negative except as noted above.   Physical Exam    VS:  BP 138/68   Pulse 78   Ht '5\' 5"'$  (1.651 m)   Wt 145 lb (65.8 kg)   SpO2 98%   BMI 24.13 kg/m  GEN: Well nourished, well developed, in no acute distress. HEENT: normal. Neck: Supple, no JVD, carotid bruits, or masses. Cardiac: RRR, no murmurs, rubs, or gallops. No clubbing, cyanosis, edema.  Radials/DP/PT 2+ and equal bilaterally.  Respiratory:  Respirations regular and unlabored, clear to auscultation bilaterally. GI: Soft, nontender, nondistended, BS + x 4. MS: no deformity or  atrophy. Skin: warm and dry, no rash. Neuro:  Strength and sensation are intact. Psych: Normal affect.  Accessory Clinical Findings    ECG personally reviewed by me today -NSR, 79 bpm, RBBB, LAFB, LVH with repol changes- no acute changes.  Lab Results  Component Value Date   WBC 14.5 (H) 06/28/2022   HGB 12.7 06/28/2022   HCT 39.6 06/28/2022   MCV 91.9 06/28/2022   PLT 329 06/28/2022   Lab Results  Component Value Date   CREATININE 0.86 08/28/2022   BUN 15 08/28/2022   NA 141 08/28/2022   K 4.0 08/28/2022   CL 103 08/28/2022   CO2 28 08/28/2022   Lab Results  Component Value Date   ALT 54 (H) 06/28/2022   AST 125 (H) 06/28/2022   ALKPHOS 98 06/28/2022   BILITOT 0.7 06/28/2022   Lab Results  Component Value Date   CHOL 97 10/14/2021   HDL 29 (L) 10/14/2021   LDLCALC 50 10/14/2021   TRIG 89 10/14/2021   CHOLHDL 3.3 10/14/2021    Lab Results  Component Value Date   HGBA1C 7.9 (H) 08/28/2022    Assessment & Plan    1. CAD: S/p CABG x 5 in 2021. Stable with no anginal symptoms. No indication for ischemic evaluation.  Not on ASA in the setting of chronic Xarelto.  Continue hydrochlorothiazide, metoprolol, telmisartan, Jardiance, and Lipitor.  2. PAD: H/o extensive PAD with critical limb ischemia, nonhealing wounds s/p multiple interventions including bilateral TMA's, stenting of right extrailiac artery, and PTCA to right popliteal and peroneal arteries. ABIs in 01/2022 showed right ABIs somewhat decreased compared to prior study, left ABIs were essentially unchanged getting moderate bilateral lower extremity arterial disease.  Denies worsening claudication.  Follows with vascular surgery.  Continue Xarelto, Lipitor.   3. Carotid artery disease: Carotid Dopplers in 11/2021 revealed 40 to 59% R ICA stenosis, 1 to 81% LICA stenosis, and greater than 50% bilateral ECA stenosis. Repeat study scheduled for 11/2022.  Continue Lipitor.  4. Hypertension: BP well controlled.  Continue current antihypertensive regimen.   5. Hyperlipidemia: LDL was 50 in 09/2021.  Lipitor.  6. Type 2 diabetes: Recently started on insulin.  A1c was 7.9 on 08/28/2022.  Managed per endocrinology.  7. Preoperative cardiac exam: According to the Revised Cardiac Risk Index (RCRI), her Perioperative Risk of Major Cardiac Event is (%): 6.6. Her Functional Capacity in METs is: 5.81 according to the Duke Activity Status Index (DASI). Therefore, based on ACC/AHA guidelines, patient would be at acceptable risk for the planned procedure without further cardiovascular testing.,  Patient on chronic DOAC therapy with Xarelto, managed per vascular surgery, therefore, recommendations for holding Xarelto prior to surgery should come from managing provider. I will route this recommendation to the requesting party via Epic fax function.  8. Disposition: Follow-up with Dr. Gwenlyn Found in 11/2022.      Lenna Sciara, NP 09/13/2022, 9:49 AM

## 2022-09-13 NOTE — Patient Instructions (Signed)
Medication Instructions:  Your physician recommends that you continue on your current medications as directed. Please refer to the Current Medication list given to you today.   *If you need a refill on your cardiac medications before your next appointment, please call your pharmacy*   Lab Work: NONE ordered at this time of appointment   If you have labs (blood work) drawn today and your tests are completely normal, you will receive your results only by: [place] (if you have MyChart) OR A paper copy in the mail If you have any lab test that is abnormal or we need to change your treatment, we will call you to review the results.   Testing/Procedures: NONE ordered at this time of appointment     Follow-Up: At [facility], you and your health needs are our priority.  As part of our continuing mission to provide you with exceptional heart care, we have created designated Provider Care Teams.  These Care Teams include your primary Cardiologist (physician) and Advanced Practice Providers (APPs -  Physician Assistants and Nurse Practitioners) who all work together to provide you with the care you need, when you need it.  We recommend signing up for the patient portal called "MyChart".  Sign up information is provided on this After Visit Summary.  MyChart is used to connect with patients for Virtual Visits (Telemedicine).  Patients are able to view lab/test results, encounter notes, upcoming appointments, etc.  Non-urgent messages can be sent to your provider as well.   To learn more about what you can do with MyChart, go to [email or web address].    Your next appointment:    1st available   The format for your next appointment:   In Person  Provider:   [name], MD     Other Instructions   Important Information About Sugar

## 2022-09-18 ENCOUNTER — Other Ambulatory Visit: Payer: Self-pay | Admitting: Endocrinology

## 2022-10-05 NOTE — Progress Notes (Signed)
Surgical Instructions    Your procedure is scheduled on Friday, 10/12/22.  Report to [facility] Main Entrance "A" at 6:30 A.M., then check in with the Admitting office.  Call this number if you have problems the morning of surgery:  [phone]   If you have any questions prior to your surgery date call [phone]: Open Monday-Friday 8am-4pm If you experience any cold or flu symptoms such as cough, fever, chills, shortness of breath, etc. between now and your scheduled surgery, please notify us at the above number     Remember:  Do not eat after midnight the night before your surgery  You may drink clear liquids until 5:30am the morning of your surgery.   Clear liquids allowed are: Water, Non-Citrus Juices (without pulp), Carbonated Beverages, Clear Tea, Black Coffee ONLY (NO MILK, CREAM OR POWDERED CREAMER of any kind), and Gatorade    Take these medicines the morning of surgery with A SIP OF WATER:  atorvastatin (LIPITOR)  brimonidine (ALPHAGAN)  metoprolol tartrate (LOPRESSOR)  pantoprazole (PROTONIX)   IF NEEDED: Propylene Glycol (SYSTANE BALANCE)   As of today, STOP taking any Aspirin (unless otherwise instructed by your surgeon) Aleve, Naproxen, Ibuprofen, Motrin, Advil, Goody's, BC's, all herbal medications, fish oil, and all vitamins.  Do not take XARELTO 2 days prior to surgery. Your last dose will be 10/09/22.  WHAT DO I DO ABOUT MY DIABETES MEDICATION?   Do not take oral diabetes medicines (pills) the morning of surgery.  Do not take JARDIANCE 72 hours prior to surgery. Your last dose will be 10/08/22.      THE MORNING OF SURGERY, do not take glimepiride (AMARYL) or metFORMIN (GLUCOPHAGE) or repaglinide (PRANDIN).  THE MORNING OF SURGERY, if your CBG is greater than 220 mg/dL, you may take  of your sliding scale (Insulin Lispro Prot & Lispro (HUMALOG 75/25) dose of insulin.  The day of surgery, do not take other diabetes injectables, including Byetta  (exenatide), Bydureon (exenatide ER), Victoza (liraglutide), or Trulicity (dulaglutide).     HOW TO MANAGE YOUR DIABETES BEFORE AND AFTER SURGERY  Why is it important to control my blood sugar before and after surgery? Improving blood sugar levels before and after surgery helps healing and can limit problems. A way of improving blood sugar control is eating a healthy diet by:  Eating less sugar and carbohydrates  Increasing activity/exercise  Talking with your doctor about reaching your blood sugar goals High blood sugars (greater than 180 mg/dL) can raise your risk of infections and slow your recovery, so you will need to focus on controlling your diabetes during the weeks before surgery. Make sure that the doctor who takes care of your diabetes knows about your planned surgery including the date and location.  How do I manage my blood sugar before surgery? Check your blood sugar at least 4 times a day, starting 2 days before surgery, to make sure that the level is not too high or low.  Check your blood sugar the morning of your surgery when you wake up and every 2 hours until you get to the Short Stay unit.  If your blood sugar is less than 70 mg/dL, you will need to treat for low blood sugar: Do not take insulin. Treat a low blood sugar (less than 70 mg/dL) with  cup of clear juice (cranberry or apple), 4 glucose tablets, OR glucose gel. Recheck blood sugar in 15 minutes after treatment (to make sure it is greater than 70 mg/dL). If your blood  sugar is not greater than 70 mg/dL on recheck, call [phone] for further instructions. Report your blood sugar to the short stay nurse when you get to Short Stay.  If you are admitted to the hospital after surgery: Your blood sugar will be checked by the staff and you will probably be given insulin after surgery (instead of oral diabetes medicines) to make sure you have good blood sugar levels. The goal for blood sugar control after  surgery is 80-180 mg/dL.       Do not wear jewelry or makeup. Do not wear lotions, powders, perfumes or deodorant. Do not shave 48 hours prior to surgery.   Do not bring valuables to the hospital. Do not wear nail polish, gel polish, artificial nails, or any other type of covering on natural nails (fingers and toes) If you have artificial nails or gel coating that need to be removed by a nail salon, please have this removed prior to surgery. Artificial nails or gel coating may interfere with anesthesia's ability to adequately monitor your vital signs.  [facility] is not responsible for any belongings or valuables.    Do NOT Smoke (Tobacco/Vaping)  24 hours prior to your procedure  If you use a CPAP at night, you may bring your mask for your overnight stay.   Contacts, glasses, hearing aids, dentures or partials may not be worn into surgery, please bring cases for these belongings   For patients admitted to the hospital, discharge time will be determined by your treatment team.   Patients discharged the day of surgery will not be allowed to drive home, and someone needs to stay with them for 24 hours.   SURGICAL WAITING ROOM VISITATION Patients having surgery or a procedure may have no more than 2 support people in the waiting area - these visitors may rotate.   Children under the age of 61 must have an adult with them who is not the patient. If the patient needs to stay at the hospital during part of their recovery, the visitor guidelines for inpatient rooms apply. Pre-op nurse will coordinate an appropriate time for 1 support person to accompany patient in pre-op.  This support person may not rotate.   Please refer to [email or web address] for the visitor guidelines for Inpatients (after your surgery is over and you are in a regular room).    Special instructions:    Oral Hygiene is also important to reduce your risk of infection.   Remember - BRUSH YOUR TEETH THE MORNING OF SURGERY WITH YOUR REGULAR TOOTHPASTE   [facility]- Preparing For Surgery  Before surgery, you can play an important role. Because skin is not sterile, your skin needs to be as free of germs as possible. You can reduce the number of germs on your skin by washing with CHG (chlorahexidine gluconate) Soap before surgery.  CHG is an antiseptic cleaner which kills germs and bonds with the skin to continue killing germs even after washing.     Please do not use if you have an allergy to CHG or antibacterial soaps. If your skin becomes reddened/irritated stop using the CHG.  Do not shave (including legs and underarms) for at least 48 hours prior to first CHG shower. It is OK to shave your face.  Please follow these instructions carefully.     Shower the NIGHT BEFORE SURGERY and the MORNING OF SURGERY with CHG Soap.   If you chose to wash your hair, wash your hair first as usual with your  normal shampoo. After you shampoo, rinse your hair and body thoroughly to remove the shampoo.  Then [employer] and genitals (private parts) with your normal soap and rinse thoroughly to remove soap.  After that Use CHG Soap as you would any other liquid soap. You can apply CHG directly to the skin and wash gently with a scrungie or a clean washcloth.   Apply the CHG Soap to your body ONLY FROM THE NECK DOWN.  Do not use on open wounds or open sores. Avoid contact with your eyes, ears, mouth and genitals (private parts). Wash Face and genitals (private parts)  with your normal soap.   Wash thoroughly, paying special attention to the area where your surgery will be performed.  Thoroughly rinse your body with warm water from the neck down.  DO NOT shower/wash with your normal soap after using and rinsing off the CHG Soap.  Pat yourself dry with a CLEAN TOWEL.  Wear CLEAN PAJAMAS to bed the night before surgery  Place CLEAN SHEETS on your bed the night before your  surgery  DO NOT SLEEP WITH PETS.   Day of Surgery: Take a shower with CHG soap. Wear Clean/Comfortable clothing the morning of surgery Do not apply any deodorants/lotions.   Remember to brush your teeth WITH YOUR REGULAR TOOTHPASTE.    If you received a COVID test during your pre-op visit, it is requested that you wear a mask when out in public, stay away from anyone that may not be feeling well, and notify your surgeon if you develop symptoms. If you have been in contact with anyone that has tested positive in the last 10 days, please notify your surgeon.    Please read over the following fact sheets that you were given.

## 2022-10-08 ENCOUNTER — Other Ambulatory Visit: Payer: Self-pay

## 2022-10-08 ENCOUNTER — Encounter (HOSPITAL_COMMUNITY)
Admission: RE | Admit: 2022-10-08 | Discharge: 2022-10-08 | Disposition: A | Discharge status: Home / Self Care | Payer: Medicare Other | Source: Ambulatory Visit | Attending: Surgery | Admitting: Surgery

## 2022-10-08 ENCOUNTER — Encounter (HOSPITAL_COMMUNITY): Payer: Self-pay

## 2022-10-08 ENCOUNTER — Ambulatory Visit: Payer: Self-pay | Admitting: Surgery

## 2022-10-08 VITALS — BP 157/61 | HR 79 | Temp 97.8°F | Resp 18 | Ht 65.0 in | Wt 140.0 lb

## 2022-10-08 DIAGNOSIS — Z01812 Encounter for preprocedural laboratory examination: Secondary | ICD-10-CM | POA: Diagnosis not present

## 2022-10-08 DIAGNOSIS — E119 Type 2 diabetes mellitus without complications: Secondary | ICD-10-CM | POA: Diagnosis not present

## 2022-10-08 DIAGNOSIS — K8020 Calculus of gallbladder without cholecystitis without obstruction: Secondary | ICD-10-CM | POA: Diagnosis not present

## 2022-10-08 DIAGNOSIS — Z01818 Encounter for other preprocedural examination: Secondary | ICD-10-CM

## 2022-10-08 LAB — CBC
HCT: 38.4 % (ref 36.0–46.0)
Hemoglobin: 12 g/dL (ref 12.0–15.0)
MCH: 28.4 pg (ref 26.0–34.0)
MCHC: 31.3 g/dL (ref 30.0–36.0)
MCV: 91 fL (ref 80.0–100.0)
Platelets: 336 10*3/uL (ref 150–400)
RBC: 4.22 MIL/uL (ref 3.87–5.11)
RDW: 14.7 % (ref 11.5–15.5)
WBC: 12.7 10*3/uL — ABNORMAL HIGH (ref 4.0–10.5)
nRBC: 0 % (ref 0.0–0.2)

## 2022-10-08 LAB — BASIC METABOLIC PANEL
Anion gap: 14 (ref 5–15)
BUN: 23 mg/dL (ref 8–23)
CO2: 24 mmol/L (ref 22–32)
Calcium: 10.2 mg/dL (ref 8.9–10.3)
Chloride: 100 mmol/L (ref 98–111)
Creatinine, Ser: 1.09 mg/dL — ABNORMAL HIGH (ref 0.44–1.00)
GFR, Estimated: 51 mL/min — ABNORMAL LOW (ref >60)
Glucose, Bld: 136 mg/dL — ABNORMAL HIGH (ref 70–99)
Potassium: 4.6 mmol/L (ref 3.5–5.1)
Sodium: 138 mmol/L (ref 135–145)

## 2022-10-08 LAB — GLUCOSE, CAPILLARY: Glucose-Capillary: 148 mg/dL — ABNORMAL HIGH (ref 70–99)

## 2022-10-08 NOTE — Progress Notes (Signed)
PCP - Dr. [name] Cardiologist - Dr. [name]  PPM/ICD - denies   Chest x-ray - 06/28/22 EKG - 09/13/22 Stress Test - 05/23/06 ECHO - 03/23/20 Cardiac Cath - 03/22/20  Sleep Study - denies  DM: Type 2 Fasting Blood Sugar - 85-120 Checks Blood Sugar once a day  Last dose of GLP1 agonist-  n/a   Blood Thinner Instructions: Hold Xarelto 3 days. Last dose 1/16. Aspirin Instructions: n/a  ERAS Protcol - yes, no drink   COVID TEST- n/a   Anesthesia review: yes, cardiac hx  Patient denies shortness of breath, fever, cough and chest pain at PAT appointment   All instructions explained to the patient, with a verbal understanding of the material. Patient agrees to go over the instructions while at home for a better understanding. The opportunity to ask questions was provided.

## 2022-10-09 NOTE — Progress Notes (Signed)
Anesthesia Chart Review:  Case: [id number] Date/Time: 10/12/22 0815   Procedure: LAPAROSCOPIC CHOLECYSTECTOMY   Anesthesia type: General   Pre-op diagnosis: GALLSTONES   Location: [place] OR ROOM 02 / [place] [facility]   Surgeons: Dwan Bolt, MD       DISCUSSION: Patient is an 82 year old female scheduled for the above procedure.  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 She reported becoming agitated, "jerking" with Versed once in the past.  Last cardiology evaluation was on 09/13/22 by Diona Browner, NP for follow-up and preoperative evaluation. CAD felt stable without anginal symptoms. No ischemic testing recommended. She wrote, "Preoperative cardiac exam: According to the Revised Cardiac Risk Index (RCRI), her Perioperative Risk of Major Cardiac Event is (%): 6.6. Her Functional Capacity in METs is: 5.81 according to the Duke Activity Status Index (DASI). Therefore, based on ACC/AHA guidelines, patient would be at acceptable risk for the planned procedure without further cardiovascular testing.,  Patient on chronic DOAC therapy with Xarelto, managed per vascular surgery, therefore, recommendations for holding Xarelto prior to surgery should come from managing provider." She reported instructions to hold Xarelto for 3 days prior to surgery, last dose 10/09/22.   Anesthesia team to evaluate on the day of surgery.    VS: BP (!) 157/61   Pulse 79   Temp 36.6 C   Resp 18   Ht '5\' 5"'$  (1.651 m)   Wt 63.5 kg   SpO2 100%   BMI  23.30 kg/m    PROVIDERS: [name], MD is PCP  [name], MD is cardiologist Jamelle Haring, MD is vascular surgeon Elayne Snare, MD is endocrinologist   LABS: Labs reviewed: Acceptable for surgery. A1c 7.9% on 08/28/22. (all labs ordered are listed, but only abnormal results are displayed)  Labs Reviewed  GLUCOSE, CAPILLARY - Abnormal; Notable for the following components:      Result Value   Glucose-Capillary 148 (*)    All other components within normal limits  BASIC METABOLIC PANEL - Abnormal; Notable for the following components:   Glucose, Bld 136 (*)    Creatinine, Ser 1.09 (*)    GFR, Estimated 51 (*)    All other components within normal limits  CBC - Abnormal; Notable for the following components:   WBC 12.7 (*)    All other components within normal limits    IMAGES: MR Liver 08/30/22: IMPRESSION: 1. Diminishing size of RIGHT hepatic abnormality adjacent to gallbladder fossa and large gallstone within collapsed gallbladder. 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 This is particularly true when compared to imaging of October 5th. Adjacent inflammation extending towards the colon is not visible on the current MRI. Suggest continued follow-up in 8 weeks time to ensure continued regression of these findings. Correlate with any ongoing symptoms that would suggest acute inflammation superimposed on chronic changes though there are no gross changes currently from previous imaging that would suggest this possibility. 2. Patent biliary tree with excretion of hepatic site specific contrast into the small bowel at 20 minutes. 3. Severe atherosclerotic changes of the visceral vasculature as discussed on  prior imaging. Correlate with any signs of mesenteric angina. 4. New small RIGHT-sided effusion and trace LEFT-sided effusion in this patient with cardiomegaly. Correlate with any signs of  cardiac dysfunction or heart failure.    EKG: 09/13/22:  Normal sinus rhythm Right bundle branch block Left anterior fascicular block Bifascicular block Left ventricular hypertrophy with QRS widening and repolarization abnormality Cannot rule out septal infarct, age undetermined   CV: US Carotid 11/30/21: Summary:  - Right Carotid: Velocities in the right ICA are consistent with a 40-59% stenosis. The ECA appears >50% stenosed.  - Left Carotid: Velocities in the left ICA are consistent with a 1-39% stenosis. The ECA appears >50% stenosed.  - Vertebrals:  Bilateral vertebral arteries demonstrate antegrade flow.  - Subclavians: Normal flow hemodynamics were seen in bilateral subclavian arteries.    Intra-Op TEE 03/24/20: PRE-OP FINDINGS  - Left Ventricle: The left ventricle has low normal systolic function, with an ejection fraction of 50-55%. The cavity size was mildly dilated. There is no increase in left ventricular wall thickness.  - Right Ventricle: The right ventricle has normal systolic function. The cavity was mildly enlarged. There is no increase in right ventricular wall thickness.  - Left Atrium: Left atrial size was normal in size. The left atrial  appendage is well visualized and there is no evidence of thrombus present.  - Right Atrium: Right atrial size was normal in size.  - Interatrial Septum: No atrial level shunt detected by color flow Doppler.  - Pericardium: There is no evidence of pericardial effusion.  - Mitral Valve: Tip of Ant leaflet has calcifies edge. No thickening of the mitral valve leaflet. Mild calcification of the mitral valve leaflet. Mitral valve regurgitation is mild by color flow Doppler. The MR jet is centrally-directed. There is no evidence of mitral stenosis.  - Tricuspid Valve: The tricuspid valve was normal in structure. Tricuspid valve regurgitation was not visualized by color flow Doppler.  - Aortic Valve: The aortic valve is tricuspid Aortic  valve regurgitation was not visualized by color flow Doppler. There is no evidence of aortic valve stenosis.  - Pulmonic Valve: The pulmonic valve was not assessed. Pulmonic valve regurgitation is not visualized by color flow Doppler.  - Aorta: The aortic root, ascending aorta and aortic arch are normal in size and structure.      Echo 03/23/20: IMPRESSIONS   1. Left ventricular ejection fraction, by estimation, is 60 to 65%. The  left ventricle has normal function. The left ventricle has no regional  wall motion abnormalities. Left ventricular diastolic parameters are  consistent with Grade I diastolic  dysfunction (impaired relaxation).   2. Right ventricular systolic function is normal. The right ventricular  size is normal. There is normal pulmonary artery systolic pressure.   3. Left atrial size was mildly dilated.   4. The mitral valve is normal in structure. Trivial mitral valve  regurgitation. No evidence of mitral stenosis.   5. The aortic valve is normal in structure. Aortic valve regurgitation is  not visualized. No aortic stenosis is present.   6. The inferior vena cava is normal in size with greater than 50%  respiratory variability, suggesting right atrial pressure of 3 mmHg.    Last LHC was 03/22/20 prior to CABG.  Past Medical History:  Diagnosis Date   Anemia    after CABG in june 2021   Arthritis    Cancer ([place])    removal from nose - MOSE procedure    Complication of anesthesia  VERSED- agitated, muscle spasms, "jerking" , frantic , (never had this occurence in the pas)    Coronary artery disease    Diabetes mellitus without complication ([place])    Dysrhythmia    PVC's   GERD (gastroesophageal reflux disease)    Heart murmur    History of hiatal hernia    Hyperlipidemia    Hypertension 11/20/11   ECHO- EF>55% Borderline concentric left ventricular hypertrophy. There is a small calcified mass in the L:A near the LA appendage. No valvular masses seen with  associated mitral annular calcification. LA Volume/ BSA27.4 ml/m2 No AS. Right ventricular systolic pressure is elevated at 20mHg.   Myocardial infarction ([facility]    June 2021   Neuromuscular disorder ([place]    neuropathy in bilateral feet   Peripheral vascular disease ([place]    Pneumonia    not hosp.    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  PTCAs with drug coated balloon Lt ant tibial artery and Lt popliteal artery  11/26/2019    Past Surgical History:  Procedure Laterality Date   ABDOMINAL AORTAGRAM  11/25/2019   ABDOMINAL AORTOGRAM W/LOWER EXTREMITY    ABDOMINAL AORTOGRAM N/A 06/15/2019   Procedure: ABDOMINAL AORTOGRAM;  Surgeon: BLorretta Harp MD;  [facility];  Service: Cardiovascular;  Laterality: N/A;   ABDOMINAL AORTOGRAM W/LOWER EXTREMITY N/A 11/25/2019   Procedure: ABDOMINAL AORTOGRAM W/LOWER EXTREMITY;  Surgeon: AWellington Hampshire MD;  [facility];  Service: Cardiovascular;  Laterality: N/A;  Lt leg   ABDOMINAL AORTOGRAM W/LOWER EXTREMITY N/A 08/01/2020   Procedure: ABDOMINAL AORTOGRAM W/LOWER EXTREMITY;  Surgeon: BLorretta Harp MD;  [facility];  Service: Cardiovascular;  Laterality: N/A;   ABDOMINAL AORTOGRAM W/LOWER EXTREMITY N/A 04/28/2021   Procedure: ABDOMINAL AORTOGRAM W/LOWER EXTREMITY;  Surgeon: FElam Dutch MD;  [facility];  Service: Cardiovascular;  Laterality: N/A;   ABDOMINAL AORTOGRAM W/LOWER EXTREMITY N/A 10/13/2021   Procedure: ABDOMINAL AORTOGRAM W/LOWER EXTREMITY;  Surgeon: HCherre Robins MD;  [facility];  Service: Cardiovascular;  Laterality: N/A;   AMPUTATION TOE Left 05/03/2021   Procedure: AMPUTATION OF THIRD LEFT  TOE;  Surgeon: HCherre Robins MD;  [facility];  Service: Vascular;  Laterality: Left;   APPENDECTOMY     APPLICATION OF WOUND VAC Right 07/21/2019   Procedure: Application Of Prevena Wound Vac Right Groin;  Surgeon: BSerafina Mitchell MD;  [facility];  Service: Vascular;  Laterality: Right;   CARPAL TUNNEL RELEASE Left    CARPAL TUNNEL RELEASE Right    CESAREAN SECTION     x 2   COLONOSCOPY     CORONARY ARTERY BYPASS GRAFT N/A 03/24/2020   Procedure: CORONARY ARTERY BYPASS GRAFTING (CABG) using LIMA to LAD (m); RIMA to RAMUS; Endoscopic Right Greater Saphenous Vein: SVG to Diag1; SVG to PLB (right); and SVG to PL (left).;  Surgeon: AWonda Olds MD;  [place]  Service: Open Heart Surgery;  Laterality: N/A;  BILATERAL IMA   ENDARTERECTOMY FEMORAL Right 07/21/2019   Procedure: RIGHT ENDARTERECTOMY FEMORAL WITH PATCH ANGIOPLASTY;  Surgeon: BSerafina Mitchell MD;  [place]  Service: Vascular;  Laterality: Right;   ENDARTERECTOMY FEMORAL Right 08/16/2020   Procedure: RIGHT FEMORAL ENDARTERECTOMY;  Surgeon: CWaynetta Sandy MD;  [place] [facility]  Service: Vascular;  Laterality: Right;   ENDOVEIN HARVEST OF GREATER SAPHENOUS VEIN Right 03/24/2020   Procedure: ENDOVEIN HARVEST OF GREATER  SAPHENOUS VEIN;  Surgeon: [name], MD;  Location: [place];  Service: Open Heart Surgery;  Laterality: Right;   EYE SURGERY     cataract removal bilaterally   FEMORAL-POPLITEAL BYPASS GRAFT Right 08/16/2020   Procedure: BYPASS GRAFT FEMORAL-POPLITEAL ARTERY;  Surgeon: [name], MD;  Location: [place];  Service: Vascular;  Laterality: Right;   FEMORAL-POPLITEAL BYPASS GRAFT Left 05/03/2021   Procedure: LEFT FEMORAL TO BELOW KNEE POPLITEAL ARTERY BYPASS GRAFTING WITH 6MMX80 PTFE GRAFT;  Surgeon: [name], MD;  Location: [place];  Service: Vascular;  Laterality: Left;   INTRAVASCULAR LITHOTRIPSY  11/25/2019   Procedure: INTRAVASCULAR LITHOTRIPSY;  Surgeon: [name], MD;  Location: [place] CV LAB;  Service: Cardiovascular;;  LT. SFA   LEFT HEART CATH AND CORONARY ANGIOGRAPHY N/A 03/22/2020   Procedure: LEFT HEART CATH AND CORONARY ANGIOGRAPHY;  Surgeon: Martinique, Peter  M, MD;  Location: [place] CV LAB;  Service: Cardiovascular;  Laterality: N/A;   LOWER EXTREMITY ANGIOGRAPHY Bilateral 06/15/2019   Procedure: Lower Extremity Angiography;  Surgeon: [name], MD;  Location: [place] [facility] LAB;  Service: Cardiovascular;  Laterality: Bilateral;   LOWER EXTREMITY ANGIOGRAPHY Right 08/03/2019   Procedure: LOWER EXTREMITY ANGIOGRAPHY;  Surgeon: [name], MD;  Location: [place] [facility] LAB;  Service: Cardiovascular;  Laterality: Right;   LOWER EXTREMITY ANGIOGRAPHY N/A 09/07/2021   Procedure: LOWER EXTREMITY ANGIOGRAPHY;  Surgeon: Marty Heck, MD;  Location: [place] [facility] LAB;  Service: Cardiovascular;  Laterality: N/A;   PATCH ANGIOPLASTY Right 07/21/2019   Procedure: Patch Angioplasty Right Femoral Artery;  Surgeon: [name], MD;  Location: [place];  Service: Vascular;  Laterality: Right;   PERIPHERAL VASCULAR ATHERECTOMY  08/03/2019   Procedure: PERIPHERAL VASCULAR ATHERECTOMY;  Surgeon: [name], MD;  Location: [place] CV LAB;  Service: Cardiovascular;;  right SFA, right TP trunk   PERIPHERAL VASCULAR ATHERECTOMY  11/25/2019   Procedure: PERIPHERAL VASCULAR ATHERECTOMY;  Surgeon: [name], MD;  Location: [place] CV LAB;  Service: Cardiovascular;;  Lt.  POPLITEAL and AT   PERIPHERAL VASCULAR BALLOON ANGIOPLASTY Left 06/15/2019   Procedure: PERIPHERAL VASCULAR BALLOON ANGIOPLASTY;  Surgeon: [name], MD;  Location: [place] CV LAB;  Service: Cardiovascular;  Laterality: Left;  SFA UNSUCCESSFUL UNABLE TO CROSS LESION   PERIPHERAL VASCULAR BALLOON ANGIOPLASTY  08/03/2019   Procedure: PERIPHERAL VASCULAR BALLOON ANGIOPLASTY;  Surgeon: [name], MD;  Location: [place] CV LAB;  Service: Cardiovascular;;  right SFA, Right TP trunk   PERIPHERAL VASCULAR BALLOON ANGIOPLASTY  09/07/2021   Procedure: PERIPHERAL VASCULAR BALLOON ANGIOPLASTY;  Surgeon: Marty Heck, MD;  Location: [place] CV LAB;  Service: Cardiovascular;;   PERIPHERAL VASCULAR BALLOON ANGIOPLASTY Right 10/13/2021   Procedure: PERIPHERAL VASCULAR BALLOON ANGIOPLASTY;  Surgeon: [name], MD;  Location: [place] CV LAB;  Service: Cardiovascular;  Laterality: Right;   TEE WITHOUT CARDIOVERSION N/A 03/24/2020   Procedure: TRANSESOPHAGEAL ECHOCARDIOGRAM (TEE);  Surgeon: [name], MD;  Location: [place];  Service: Open Heart Surgery;  Laterality: N/A;   TONSILLECTOMY     and adenoidectomy   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/2019   Procedure: TRANSMETATARSAL AMPUTATION;  Surgeon: Trula Slade, DPM;  Location: [place];  Service: Podiatry;  Laterality: Right;   TRANSMETATARSAL AMPUTATION Left 05/11/2021   Procedure: LEFT TRANSMETATARSAL AMPUTATION;  Surgeon: [name], MD;  Location: [place];  Service: Vascular;  Laterality: Left;   TUBAL LIGATION  MEDICATIONS:  atorvastatin (LIPITOR) 80 MG tablet   brimonidine (ALPHAGAN) 0.2 % ophthalmic solution   CINNAMON PO   Cyanocobalamin (B-12 PO)   glimepiride (AMARYL) 1 MG tablet   hydrochlorothiazide (HYDRODIURIL) 12.5 MG tablet   Insulin Lispro Prot & Lispro (HUMALOG MIX 75/25 KWIKPEN) (75-25) 100 UNIT/ML Kwikpen   Insulin Pen Needle (ULTRA-THIN II MINI PEN NEEDLE) 31G X 5 MM MISC   JARDIANCE 25 MG TABS tablet   Lancets (ONETOUCH DELICA PLUS [id number]) MISC   metFORMIN (GLUCOPHAGE) 1000 MG tablet   metoprolol tartrate (LOPRESSOR) 25 MG tablet   ONETOUCH VERIO test strip   pantoprazole (PROTONIX) 40 MG tablet   Probiotic Product ([employer]S SENIOR PROBIOTIC PO)   Propylene Glycol (SYSTANE BALANCE) 0.6 % SOLN   pyridoxine (B-6) 100 MG tablet   repaglinide (PRANDIN) 2 MG tablet   telmisartan (MICARDIS) 80 MG tablet   XARELTO 2.5 MG TABS tablet    sodium chloride flush (NS) 0.9 % injection 3 mL    Myra Gianotti, PA-C Surgical Short Stay/Anesthesiology [facility] Phone [phone] [facility] Phone [phone] 10/09/2022  2:13 PM

## 2022-10-09 NOTE — Anesthesia Preprocedure Evaluation (Addendum)
Anesthesia Evaluation  Patient identified by MRN, date of birth, ID band Patient awake    Reviewed: Allergy & Precautions, NPO status , Patient's Chart, lab work & pertinent test results  Airway Mallampati: I  TM Distance: >3 FB Neck ROM: Full    Dental no notable dental hx.    Pulmonary former smoker   Pulmonary exam normal        Cardiovascular hypertension, Pt. on medications and Pt. on home beta blockers + CAD, + Past MI, + CABG and + Peripheral Vascular Disease  Normal cardiovascular exam     Neuro/Psych  Neuromuscular disease  negative psych ROS   GI/Hepatic Neg liver ROS, hiatal hernia,GERD  Medicated and Controlled,,  Endo/Other  diabetes, Oral Hypoglycemic Agents, Insulin Dependent    Renal/GU Renal disease     Musculoskeletal  (+) Arthritis ,    Abdominal   Peds  Hematology  (+) Blood dyscrasia (Xarelto)   Anesthesia Other Findings GALLSTONES  Reproductive/Obstetrics                             Anesthesia Physical Anesthesia Plan  ASA: 3  Anesthesia Plan: General   Post-op Pain Management:    Induction: Intravenous  PONV Risk Score and Plan: 3 and Ondansetron, Dexamethasone and Treatment may vary due to age or medical condition  Airway Management Planned: Oral ETT  Additional Equipment:   Intra-op Plan:   Post-operative Plan: Extubation in OR  Informed Consent: I have reviewed the patients History and Physical, chart, labs and discussed the procedure including the risks, benefits and alternatives for the proposed anesthesia with the patient or authorized representative who has indicated his/her understanding and acceptance.     Dental advisory given  Plan Discussed with: CRNA  Anesthesia Plan Comments: (PAT note written 10/09/2022 by Myra Gianotti, PA-C.  )       Anesthesia Quick Evaluation

## 2022-10-12 ENCOUNTER — Ambulatory Visit (HOSPITAL_COMMUNITY): Payer: Medicare Other | Admitting: Vascular Surgery

## 2022-10-12 ENCOUNTER — Ambulatory Visit (HOSPITAL_COMMUNITY): Payer: Medicare Other | Admitting: Anesthesiology

## 2022-10-12 ENCOUNTER — Encounter (HOSPITAL_COMMUNITY)
Admission: RE | Disposition: A | Discharge status: Home / Self Care | Payer: Self-pay | Source: Home / Self Care | Attending: Surgery

## 2022-10-12 ENCOUNTER — Other Ambulatory Visit: Payer: Self-pay

## 2022-10-12 ENCOUNTER — Observation Stay (HOSPITAL_COMMUNITY)
Admission: RE | Admit: 2022-10-12 | Discharge: 2022-10-14 | Disposition: A | Discharge status: Home / Self Care | Payer: Medicare Other | Attending: Surgery | Admitting: Surgery

## 2022-10-12 ENCOUNTER — Encounter (HOSPITAL_COMMUNITY): Payer: Self-pay | Admitting: Surgery

## 2022-10-12 DIAGNOSIS — I252 Old myocardial infarction: Secondary | ICD-10-CM

## 2022-10-12 DIAGNOSIS — E119 Type 2 diabetes mellitus without complications: Secondary | ICD-10-CM | POA: Diagnosis not present

## 2022-10-12 DIAGNOSIS — Z7901 Long term (current) use of anticoagulants: Secondary | ICD-10-CM | POA: Insufficient documentation

## 2022-10-12 DIAGNOSIS — I2510 Atherosclerotic heart disease of native coronary artery without angina pectoris: Secondary | ICD-10-CM | POA: Diagnosis not present

## 2022-10-12 DIAGNOSIS — K8010 Calculus of gallbladder with chronic cholecystitis without obstruction: Secondary | ICD-10-CM

## 2022-10-12 DIAGNOSIS — Z79899 Other long term (current) drug therapy: Secondary | ICD-10-CM | POA: Diagnosis not present

## 2022-10-12 DIAGNOSIS — K8020 Calculus of gallbladder without cholecystitis without obstruction: Secondary | ICD-10-CM | POA: Diagnosis present

## 2022-10-12 DIAGNOSIS — K8012 Calculus of gallbladder with acute and chronic cholecystitis without obstruction: Principal | ICD-10-CM | POA: Insufficient documentation

## 2022-10-12 DIAGNOSIS — Z951 Presence of aortocoronary bypass graft: Secondary | ICD-10-CM | POA: Diagnosis not present

## 2022-10-12 DIAGNOSIS — Z87891 Personal history of nicotine dependence: Secondary | ICD-10-CM

## 2022-10-12 DIAGNOSIS — Z85828 Personal history of other malignant neoplasm of skin: Secondary | ICD-10-CM | POA: Insufficient documentation

## 2022-10-12 DIAGNOSIS — I10 Essential (primary) hypertension: Secondary | ICD-10-CM | POA: Diagnosis not present

## 2022-10-12 DIAGNOSIS — Z794 Long term (current) use of insulin: Secondary | ICD-10-CM | POA: Insufficient documentation

## 2022-10-12 HISTORY — PX: CHOLECYSTECTOMY: SHX55

## 2022-10-12 HISTORY — DX: Calculus of gallbladder without cholecystitis without obstruction: K80.20

## 2022-10-12 LAB — GLUCOSE, CAPILLARY
Glucose-Capillary: 91 mg/dL (ref 70–99)
Glucose-Capillary: 97 mg/dL (ref 70–99)
Glucose-Capillary: 163 mg/dL — ABNORMAL HIGH (ref 70–99)
Glucose-Capillary: 174 mg/dL — ABNORMAL HIGH (ref 70–99)
Glucose-Capillary: 225 mg/dL — ABNORMAL HIGH (ref 70–99)
Glucose-Capillary: 389 mg/dL — ABNORMAL HIGH (ref 70–99)

## 2022-10-12 SURGERY — LAPAROSCOPIC CHOLECYSTECTOMY
Anesthesia: General | Site: Abdomen

## 2022-10-12 MED ORDER — PHENYLEPHRINE HCL-NACL 20-0.9 MG/250ML-% IV SOLN
INTRAVENOUS | Status: DC | PRN
  Administered 2022-10-12: 40 ug/min via INTRAVENOUS

## 2022-10-12 MED ORDER — INSULIN ASPART 100 UNIT/ML IJ SOLN
0.0000 [IU] | Freq: Three times a day (TID) | INTRAMUSCULAR | Status: DC
  Administered 2022-10-12 – 2022-10-13 (×2): 5 [IU] via SUBCUTANEOUS
  Administered 2022-10-13: 3 [IU] via SUBCUTANEOUS
  Administered 2022-10-13: 8 [IU] via SUBCUTANEOUS
  Administered 2022-10-14: 2 [IU] via SUBCUTANEOUS

## 2022-10-12 MED ORDER — CHLORHEXIDINE GLUCONATE 0.12 % MT SOLN: 15.0000 mL | Freq: Once | OROMUCOSAL | Status: AC

## 2022-10-12 MED ORDER — POLYVINYL ALCOHOL 1.4 % OP SOLN: 1.0000 [drp] | Freq: Three times a day (TID) | OPHTHALMIC | Status: DC | PRN

## 2022-10-12 MED ORDER — FENTANYL CITRATE (PF) 250 MCG/5ML IJ SOLN
INTRAMUSCULAR | Status: DC | PRN
  Administered 2022-10-12: 50 ug via INTRAVENOUS
  Administered 2022-10-12: 150 ug via INTRAVENOUS

## 2022-10-12 MED ORDER — ACETAMINOPHEN 500 MG PO TABS
500.0000 mg | ORAL_TABLET | Freq: Three times a day (TID) | ORAL | Status: DC
  Administered 2022-10-12 – 2022-10-14 (×6): 500 mg via ORAL
  Filled 2022-10-12 (×6): qty 1

## 2022-10-12 MED ORDER — IRBESARTAN 300 MG PO TABS
300.0000 mg | ORAL_TABLET | Freq: Every day | ORAL | Status: DC
  Administered 2022-10-12 – 2022-10-14 (×3): 300 mg via ORAL
  Filled 2022-10-12 (×3): qty 1

## 2022-10-12 MED ORDER — AMISULPRIDE (ANTIEMETIC) 5 MG/2ML IV SOLN: 10.0000 mg | Freq: Once | INTRAVENOUS | Status: DC | PRN

## 2022-10-12 MED ORDER — BUPIVACAINE HCL (PF) 0.25 % IJ SOLN
INTRAMUSCULAR | Status: DC | PRN
  Administered 2022-10-12: 26 mL

## 2022-10-12 MED ORDER — FENTANYL CITRATE (PF) 250 MCG/5ML IJ SOLN
INTRAMUSCULAR | Status: AC
  Filled 2022-10-12: qty 5

## 2022-10-12 MED ORDER — BRIMONIDINE TARTRATE 0.2 % OP SOLN
1.0000 [drp] | Freq: Two times a day (BID) | OPHTHALMIC | Status: DC
  Administered 2022-10-12 – 2022-10-14 (×4): 1 [drp] via OPHTHALMIC
  Filled 2022-10-12 (×2): qty 5

## 2022-10-12 MED ORDER — METOPROLOL TARTRATE 25 MG PO TABS
37.5000 mg | ORAL_TABLET | Freq: Two times a day (BID) | ORAL | Status: DC
  Administered 2022-10-12 – 2022-10-14 (×4): 37.5 mg via ORAL
  Filled 2022-10-12 (×4): qty 1

## 2022-10-12 MED ORDER — ACETAMINOPHEN 500 MG PO TABS
ORAL_TABLET | ORAL | Status: AC
  Administered 2022-10-12: 1000 mg via ORAL
  Filled 2022-10-12: qty 2

## 2022-10-12 MED ORDER — ROCURONIUM BROMIDE 10 MG/ML (PF) SYRINGE
PREFILLED_SYRINGE | INTRAVENOUS | Status: DC | PRN
  Administered 2022-10-12: 30 mg via INTRAVENOUS
  Administered 2022-10-12: 40 mg via INTRAVENOUS

## 2022-10-12 MED ORDER — DEXAMETHASONE SODIUM PHOSPHATE 10 MG/ML IJ SOLN
INTRAMUSCULAR | Status: AC
  Filled 2022-10-12: qty 1

## 2022-10-12 MED ORDER — CHLORHEXIDINE GLUCONATE 0.12 % MT SOLN
OROMUCOSAL | Status: AC
  Administered 2022-10-12: 15 mL via OROMUCOSAL
  Filled 2022-10-12: qty 15

## 2022-10-12 MED ORDER — PROPOFOL 10 MG/ML IV BOLUS
INTRAVENOUS | Status: AC
  Filled 2022-10-12: qty 20

## 2022-10-12 MED ORDER — ACETAMINOPHEN 500 MG PO TABS: 500.0000 mg | ORAL_TABLET | Freq: Three times a day (TID) | ORAL | 0 refills | Status: DC | PRN

## 2022-10-12 MED ORDER — BUPIVACAINE HCL (PF) 0.25 % IJ SOLN
INTRAMUSCULAR | Status: AC
  Filled 2022-10-12: qty 30

## 2022-10-12 MED ORDER — EPHEDRINE 5 MG/ML INJ
INTRAVENOUS | Status: AC
  Filled 2022-10-12: qty 5

## 2022-10-12 MED ORDER — CEFAZOLIN SODIUM-DEXTROSE 2-4 GM/100ML-% IV SOLN
2.0000 g | INTRAVENOUS | Status: AC
  Administered 2022-10-12: 2 g via INTRAVENOUS

## 2022-10-12 MED ORDER — LACTATED RINGERS IV SOLN: INTRAVENOUS | Status: DC

## 2022-10-12 MED ORDER — ONDANSETRON 4 MG PO TBDP: 4.0000 mg | ORAL_TABLET | Freq: Four times a day (QID) | ORAL | Status: DC | PRN

## 2022-10-12 MED ORDER — ENOXAPARIN SODIUM 40 MG/0.4ML IJ SOSY
40.0000 mg | PREFILLED_SYRINGE | INTRAMUSCULAR | Status: DC
  Administered 2022-10-13 – 2022-10-14 (×2): 40 mg via SUBCUTANEOUS
  Filled 2022-10-12 (×2): qty 0.4

## 2022-10-12 MED ORDER — SODIUM CHLORIDE 0.9 % IR SOLN
Status: DC | PRN
  Administered 2022-10-12: 1000 mL

## 2022-10-12 MED ORDER — DOCUSATE SODIUM 100 MG PO CAPS
100.0000 mg | ORAL_CAPSULE | Freq: Two times a day (BID) | ORAL | Status: DC
  Administered 2022-10-13 – 2022-10-14 (×3): 100 mg via ORAL
  Filled 2022-10-12 (×4): qty 1

## 2022-10-12 MED ORDER — ONDANSETRON HCL 4 MG/2ML IJ SOLN
INTRAMUSCULAR | Status: AC
  Filled 2022-10-12: qty 2

## 2022-10-12 MED ORDER — FENTANYL CITRATE (PF) 100 MCG/2ML IJ SOLN
INTRAMUSCULAR | Status: AC
  Filled 2022-10-12: qty 2

## 2022-10-12 MED ORDER — PHENYLEPHRINE 80 MCG/ML (10ML) SYRINGE FOR IV PUSH (FOR BLOOD PRESSURE SUPPORT)
PREFILLED_SYRINGE | INTRAVENOUS | Status: DC | PRN
  Administered 2022-10-12 (×3): 80 ug via INTRAVENOUS

## 2022-10-12 MED ORDER — LIDOCAINE 2% (20 MG/ML) 5 ML SYRINGE
INTRAMUSCULAR | Status: DC | PRN
  Administered 2022-10-12: 60 mg via INTRAVENOUS

## 2022-10-12 MED ORDER — CEFAZOLIN SODIUM-DEXTROSE 2-4 GM/100ML-% IV SOLN
INTRAVENOUS | Status: AC
  Filled 2022-10-12: qty 100

## 2022-10-12 MED ORDER — ATORVASTATIN CALCIUM 80 MG PO TABS
80.0000 mg | ORAL_TABLET | Freq: Every day | ORAL | Status: DC
  Administered 2022-10-13 – 2022-10-14 (×2): 80 mg via ORAL
  Filled 2022-10-12 (×2): qty 1

## 2022-10-12 MED ORDER — LIDOCAINE 2% (20 MG/ML) 5 ML SYRINGE
INTRAMUSCULAR | Status: AC
  Filled 2022-10-12: qty 5

## 2022-10-12 MED ORDER — DEXAMETHASONE SODIUM PHOSPHATE 10 MG/ML IJ SOLN
INTRAMUSCULAR | Status: DC | PRN
  Administered 2022-10-12: 10 mg via INTRAVENOUS

## 2022-10-12 MED ORDER — ONDANSETRON HCL 4 MG/2ML IJ SOLN
4.0000 mg | Freq: Four times a day (QID) | INTRAMUSCULAR | Status: DC | PRN
  Administered 2022-10-12: 4 mg via INTRAVENOUS

## 2022-10-12 MED ORDER — ORAL CARE MOUTH RINSE: 15.0000 mL | Freq: Once | OROMUCOSAL | Status: AC

## 2022-10-12 MED ORDER — ONDANSETRON HCL 4 MG/2ML IJ SOLN: 4.0000 mg | Freq: Once | INTRAMUSCULAR | Status: DC | PRN

## 2022-10-12 MED ORDER — ROCURONIUM BROMIDE 10 MG/ML (PF) SYRINGE
PREFILLED_SYRINGE | INTRAVENOUS | Status: AC
  Filled 2022-10-12: qty 10

## 2022-10-12 MED ORDER — INSULIN ASPART 100 UNIT/ML IJ SOLN
0.0000 [IU] | Freq: Every day | INTRAMUSCULAR | Status: DC
  Administered 2022-10-12: 5 [IU] via SUBCUTANEOUS
  Administered 2022-10-13: 3 [IU] via SUBCUTANEOUS

## 2022-10-12 MED ORDER — ACETAMINOPHEN 500 MG PO TABS: 1000.0000 mg | ORAL_TABLET | ORAL | Status: AC

## 2022-10-12 MED ORDER — HYDROCHLOROTHIAZIDE 12.5 MG PO TABS
12.5000 mg | ORAL_TABLET | Freq: Every day | ORAL | Status: DC
  Administered 2022-10-13 – 2022-10-14 (×2): 12.5 mg via ORAL
  Filled 2022-10-12 (×2): qty 1

## 2022-10-12 MED ORDER — PROPOFOL 10 MG/ML IV BOLUS
INTRAVENOUS | Status: DC | PRN
  Administered 2022-10-12: 100 mg via INTRAVENOUS

## 2022-10-12 MED ORDER — HYDROCODONE-ACETAMINOPHEN 5-325 MG PO TABS
1.0000 | ORAL_TABLET | ORAL | Status: DC | PRN
  Administered 2022-10-12 (×3): 1 via ORAL
  Filled 2022-10-12 (×4): qty 1

## 2022-10-12 MED ORDER — SUGAMMADEX SODIUM 200 MG/2ML IV SOLN
INTRAVENOUS | Status: DC | PRN
  Administered 2022-10-12: 300 mg via INTRAVENOUS

## 2022-10-12 MED ORDER — PANTOPRAZOLE SODIUM 40 MG PO TBEC
40.0000 mg | DELAYED_RELEASE_TABLET | Freq: Every day | ORAL | Status: DC
  Administered 2022-10-13 – 2022-10-14 (×2): 40 mg via ORAL
  Filled 2022-10-12 (×2): qty 1

## 2022-10-12 MED ORDER — 0.9 % SODIUM CHLORIDE (POUR BTL) OPTIME
TOPICAL | Status: DC | PRN
  Administered 2022-10-12: 1000 mL

## 2022-10-12 MED ORDER — FENTANYL CITRATE (PF) 100 MCG/2ML IJ SOLN
25.0000 ug | INTRAMUSCULAR | Status: DC | PRN
  Administered 2022-10-12 (×2): 50 ug via INTRAVENOUS
  Administered 2022-10-12 (×2): 25 ug via INTRAVENOUS

## 2022-10-12 MED ORDER — PHENYLEPHRINE 80 MCG/ML (10ML) SYRINGE FOR IV PUSH (FOR BLOOD PRESSURE SUPPORT)
PREFILLED_SYRINGE | INTRAVENOUS | Status: AC
  Filled 2022-10-12: qty 10

## 2022-10-12 SURGICAL SUPPLY — 42 items
APPLIER CLIP 5 13 M/L LIGAMAX5 (MISCELLANEOUS) ×1
BAG COUNTER SPONGE SURGICOUNT (BAG) ×1 IMPLANT
BIOPATCH RED 1 DISK 7.0 (GAUZE/BANDAGES/DRESSINGS) IMPLANT
CANISTER SUCT 3000ML PPV (MISCELLANEOUS) ×1 IMPLANT
CHLORAPREP W/TINT 26 (MISCELLANEOUS) ×1 IMPLANT
CLIP APPLIE 5 13 M/L LIGAMAX5 (MISCELLANEOUS) ×1 IMPLANT
COVER SURGICAL LIGHT HANDLE (MISCELLANEOUS) ×1 IMPLANT
DERMABOND ADVANCED .7 DNX12 (GAUZE/BANDAGES/DRESSINGS) ×1 IMPLANT
DRAIN CHANNEL 19F RND (DRAIN) IMPLANT
DRSG TEGADERM 4X4.75 (GAUZE/BANDAGES/DRESSINGS) IMPLANT
ELECT REM PT RETURN 9FT ADLT (ELECTROSURGICAL) ×1
ELECTRODE REM PT RTRN 9FT ADLT (ELECTROSURGICAL) ×1 IMPLANT
EVACUATOR SILICONE 100CC (DRAIN) IMPLANT
GLOVE BIOGEL PI IND STRL 6 (GLOVE) ×1 IMPLANT
GLOVE BIOGEL PI MICRO STRL 5.5 (GLOVE) ×1 IMPLANT
GOWN STRL REUS W/ TWL LRG LVL3 (GOWN DISPOSABLE) ×3 IMPLANT
GOWN STRL REUS W/TWL LRG LVL3 (GOWN DISPOSABLE) ×3
KIT BASIN OR (CUSTOM PROCEDURE TRAY) ×1 IMPLANT
KIT TURNOVER KIT B (KITS) ×1 IMPLANT
L-HOOK LAP DISP 36CM (ELECTROSURGICAL) ×1
LHOOK LAP DISP 36CM (ELECTROSURGICAL) ×1 IMPLANT
NS IRRIG 1000ML POUR BTL (IV SOLUTION) ×1 IMPLANT
PAD ARMBOARD 7.5X6 YLW CONV (MISCELLANEOUS) ×1 IMPLANT
PENCIL BUTTON HOLSTER BLD 10FT (ELECTRODE) ×1 IMPLANT
POUCH RETRIEVAL ECOSAC 10 (ENDOMECHANICALS) IMPLANT
POUCH RETRIEVAL ECOSAC 10MM (ENDOMECHANICALS) ×1
POUCH SPECIMEN RETRIEVAL 10MM (ENDOMECHANICALS) IMPLANT
SCISSORS LAP 5X35 DISP (ENDOMECHANICALS) ×1 IMPLANT
SET IRRIG TUBING LAPAROSCOPIC (IRRIGATION / IRRIGATOR) ×1 IMPLANT
SET TUBE SMOKE EVAC HIGH FLOW (TUBING) ×1 IMPLANT
SLEEVE ENDOPATH XCEL 5M (ENDOMECHANICALS) ×2 IMPLANT
SUT ETHILON 2 0 FS 18 (SUTURE) IMPLANT
SUT MNCRL AB 4-0 PS2 18 (SUTURE) ×1 IMPLANT
SUT SILK 3 0 SH 30 (SUTURE) IMPLANT
SUT VICRYL 0 UR6 27IN ABS (SUTURE) IMPLANT
TOWEL GREEN STERILE (TOWEL DISPOSABLE) ×1 IMPLANT
TOWEL GREEN STERILE FF (TOWEL DISPOSABLE) ×1 IMPLANT
TRAY LAPAROSCOPIC MC (CUSTOM PROCEDURE TRAY) ×1 IMPLANT
TROCAR XCEL BLUNT TIP 100MML (ENDOMECHANICALS) ×1 IMPLANT
TROCAR Z-THREAD OPTICAL 5X100M (TROCAR) ×1 IMPLANT
WARMER LAPAROSCOPE (MISCELLANEOUS) ×1 IMPLANT
WATER STERILE IRR 1000ML POUR (IV SOLUTION) ×1 IMPLANT

## 2022-10-12 NOTE — H&P (Signed)
[name] is an 82 y.o. female.   Chief Complaint: abdominal pain HPI: [name] is an 82 yo female who presents for cholecystectomy. She presented to the ED in early October with RUQ abdominal pain, and a CT scan showed evidence of acute diverticulitis at the hepatic flexure. Her pain resolved after a short time. She also had a RUQ [place] that showed gallstones, and was referred to Dr. Rosendo Gros on 11/13. At that time she was not having further pain, but reported she thought she had had some RUQ with fatty meals in the past. Observation was recommended for her gallbladder as she was not having any further pain. Her CT scan also incidentally showed a hypodense abnormality in the liver adjacent to the gallbladder. MRI with Eovist showed significant decrease in size of this area, and appeared consistent with chronic inflammation and not malignancy. She has continued to have symptoms of biliary colic and presents for cholecystectomy.  She received preop cardiac risk stratification. Xarelto has been on hold for 72 hours.  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 There is a small calcified mass in the L:A near the LA appendage. No valvular masses seen with associated mitral annular calcification. LA Volume/ BSA27.4 ml/m2 No AS. Right ventricular systolic pressure is elevated at 31mHg.   Myocardial infarction ([facility]    June 2021   Neuromuscular disorder ([place]    neuropathy in bilateral  feet   Peripheral vascular disease ([place]    Pneumonia    not hosp.    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  PTCAs with drug coated balloon Lt ant tibial artery and Lt popliteal artery  11/26/2019    Past Surgical History:  Procedure Laterality Date   ABDOMINAL AORTAGRAM  11/25/2019   ABDOMINAL AORTOGRAM W/LOWER EXTREMITY    ABDOMINAL AORTOGRAM N/A 06/15/2019   Procedure: ABDOMINAL AORTOGRAM;  Surgeon: BLorretta Harp MD;  [facility];  Service: Cardiovascular;  Laterality: N/A;   ABDOMINAL AORTOGRAM W/LOWER EXTREMITY N/A 11/25/2019   Procedure: ABDOMINAL AORTOGRAM W/LOWER EXTREMITY;  Surgeon: AWellington Hampshire MD;  [place];  Service: Cardiovascular;  Laterality: N/A;  Lt leg   ABDOMINAL AORTOGRAM W/LOWER EXTREMITY N/A 08/01/2020   Procedure: ABDOMINAL AORTOGRAM W/LOWER EXTREMITY;  Surgeon: BLorretta Harp MD;  [facility];  Service: Cardiovascular;  Laterality: N/A;   ABDOMINAL AORTOGRAM W/LOWER EXTREMITY N/A 04/28/2021   Procedure: ABDOMINAL AORTOGRAM W/LOWER EXTREMITY;  Surgeon: FElam Dutch MD;  [place];  Service: Cardiovascular;  Laterality: N/A;   ABDOMINAL AORTOGRAM W/LOWER EXTREMITY N/A 10/13/2021   Procedure: ABDOMINAL AORTOGRAM W/LOWER EXTREMITY;  Surgeon: HCherre Robins MD;  [place];  Service: Cardiovascular;  Laterality: N/A;   AMPUTATION TOE Left 05/03/2021   Procedure: AMPUTATION OF THIRD LEFT  TOE;  Surgeon:  [name], MD;  Location: [facility] OR;  Service: Vascular;  Laterality: Left;   APPENDECTOMY     APPLICATION OF WOUND VAC Right 07/21/2019   Procedure: Application Of Prevena Wound Vac Right Groin;  Surgeon: [name], MD;  [facility];  Service: Vascular;  Laterality: Right;   CARPAL TUNNEL RELEASE Left    CARPAL TUNNEL RELEASE Right    CESAREAN SECTION     x 2   COLONOSCOPY     CORONARY ARTERY BYPASS GRAFT N/A 03/24/2020    Procedure: CORONARY ARTERY BYPASS GRAFTING (CABG) using LIMA to LAD (m); RIMA to RAMUS; Endoscopic Right Greater Saphenous Vein: SVG to Diag1; SVG to PLB (right); and SVG to PL (left).;  Surgeon: [name], MD;  Location: [place];  Service: Open Heart Surgery;  Laterality: N/A;  BILATERAL IMA   ENDARTERECTOMY FEMORAL Right 07/21/2019   Procedure: RIGHT ENDARTERECTOMY FEMORAL WITH PATCH ANGIOPLASTY;  Surgeon: [name], MD;  Location: [place];  Service: Vascular;  Laterality: Right;   ENDARTERECTOMY FEMORAL Right 08/16/2020   Procedure: RIGHT FEMORAL ENDARTERECTOMY;  Surgeon: [name], MD;  Location: [place];  Service: Vascular;  Laterality: Right;   ENDOVEIN HARVEST OF GREATER SAPHENOUS VEIN Right 03/24/2020   Procedure: ENDOVEIN HARVEST OF [place];  Surgeon: [name], MD;  Location: [place];  Service: Open Heart Surgery;  Laterality: Right;   EYE SURGERY     cataract removal bilaterally   FEMORAL-POPLITEAL BYPASS GRAFT Right 08/16/2020   Procedure: BYPASS GRAFT FEMORAL-POPLITEAL ARTERY;  Surgeon: [name], MD;  Location: [place];  Service: Vascular;  Laterality: Right;   FEMORAL-POPLITEAL BYPASS GRAFT Left 05/03/2021   Procedure: LEFT FEMORAL TO BELOW KNEE POPLITEAL ARTERY BYPASS GRAFTING WITH 6MMX80 PTFE GRAFT;  Surgeon: [name], MD;  Location: [place];  Service: Vascular;  Laterality: Left;   INTRAVASCULAR LITHOTRIPSY  11/25/2019   Procedure: INTRAVASCULAR LITHOTRIPSY;  Surgeon: [name], MD;  Location: [place] [facility];  Service: Cardiovascular;;  LT. SFA   LEFT HEART CATH AND CORONARY ANGIOGRAPHY N/A 03/22/2020   Procedure: LEFT HEART CATH AND CORONARY ANGIOGRAPHY;  Surgeon: Martinique, Peter M, MD;  Location: [place] [facility];  Service: Cardiovascular;  Laterality: N/A;   LOWER EXTREMITY ANGIOGRAPHY Bilateral 06/15/2019   Procedure: Lower Extremity Angiography;  Surgeon: [name], MD;  Location: [place] [facility];  Service: Cardiovascular;  Laterality: Bilateral;   LOWER EXTREMITY ANGIOGRAPHY Right 08/03/2019   Procedure: LOWER EXTREMITY ANGIOGRAPHY;  Surgeon: [name], MD;  Location: [place] [facility];  Service: Cardiovascular;  Laterality: Right;   LOWER EXTREMITY ANGIOGRAPHY N/A 09/07/2021   Procedure: LOWER EXTREMITY ANGIOGRAPHY;  Surgeon: Marty Heck, MD;  Location: [place] [facility];  Service: Cardiovascular;  Laterality: N/A;   PATCH ANGIOPLASTY Right 07/21/2019   Procedure: Patch Angioplasty Right Femoral Artery;  Surgeon: [name], MD;  Location: [place];  Service: Vascular;  Laterality: Right;   PERIPHERAL VASCULAR ATHERECTOMY  08/03/2019   Procedure: PERIPHERAL VASCULAR ATHERECTOMY;  Surgeon: [name], MD;  Location: [place] [facility];  Service: Cardiovascular;;  right SFA, right TP trunk   PERIPHERAL VASCULAR ATHERECTOMY  11/25/2019   Procedure: PERIPHERAL VASCULAR ATHERECTOMY;  Surgeon: [name], MD;  Location: [place] [facility];  Service: Cardiovascular;;  Lt.  POPLITEAL and AT   PERIPHERAL VASCULAR BALLOON ANGIOPLASTY Left 06/15/2019   Procedure: PERIPHERAL VASCULAR BALLOON ANGIOPLASTY;  Surgeon: [name], MD;  Location: [place] [facility];  Service: Cardiovascular;  Laterality: Left;  SFA UNSUCCESSFUL UNABLE TO CROSS LESION   PERIPHERAL VASCULAR BALLOON ANGIOPLASTY  08/03/2019   Procedure: PERIPHERAL VASCULAR BALLOON ANGIOPLASTY;  Surgeon: [name], MD;  Location: [place] [facility];  Service: Cardiovascular;;  right SFA, Right TP trunk   PERIPHERAL VASCULAR BALLOON ANGIOPLASTY  09/07/2021   Procedure: PERIPHERAL VASCULAR BALLOON ANGIOPLASTY;  Surgeon: Marty Heck, MD;  Location: [place] [facility];  Service: Cardiovascular;;   PERIPHERAL VASCULAR BALLOON ANGIOPLASTY Right 10/13/2021   Procedure: PERIPHERAL VASCULAR BALLOON ANGIOPLASTY;  Surgeon: [name], MD;  Location: [place] [facility];   Service: Cardiovascular;  Laterality: Right;   TEE WITHOUT CARDIOVERSION N/A 03/24/2020   Procedure: TRANSESOPHAGEAL ECHOCARDIOGRAM (TEE);  Surgeon: [name], MD;  Location: [place];  Service: Open Heart Surgery;  Laterality: N/A;   TONSILLECTOMY     and adenoidectomy   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/2019   Procedure: TRANSMETATARSAL AMPUTATION;  Surgeon: Trula Slade, DPM;  Location: [place];  Service: Podiatry;  Laterality: Right;   TRANSMETATARSAL AMPUTATION Left 05/11/2021   Procedure: LEFT TRANSMETATARSAL AMPUTATION;  Surgeon: [name], MD;  [facility];  Service: Vascular;  Laterality: Left;   TUBAL LIGATION      Family History  Problem Relation Age of Onset   Cancer - Prostate Father    Cancer - Colon Father    Stroke Mother    Hypertension Mother    Hyperlipidemia Mother    Melanoma Brother    Social History:  reports that she quit smoking about 50 years ago. Her smoking use included cigarettes. She has never used smokeless tobacco. She reports that she does not drink alcohol and does not use drugs.  Allergies:  Allergies  Allergen Reactions   Versed [Midazolam] Anxiety    Frantic, out of my mind, agitated    Augmentin [Amoxicillin-Pot Clavulanate] Other (See Comments)    Elevates potassium level   Bactrim [Sulfamethoxazole-Trimethoprim] Other (See Comments)    ^ K+( elevated)    Demerol [Meperidine]     Delusional    Meperidine Hcl     Other reaction(s): delusional   Scopolamine     Delusional    Tramadol Other (See Comments)    Keeps awake    Facility-Administered Medications Prior to Admission  Medication Dose Route Frequency Provider Last Rate Last Admin   sodium chloride flush (NS) 0.9 % injection 3 mL  3 mL Intravenous Q12H [name], MD       Medications Prior to Admission  Medication Sig Dispense Refill   atorvastatin (LIPITOR) 80 MG tablet TAKE 1 TABLET BY MOUTH ONCE DAILY. MUST  KEEP  UPCOMING   APPOINTMENT  FOR  FUTURE  REFILLS 90 tablet 3   brimonidine (ALPHAGAN) 0.2 % ophthalmic solution Place 1 drop into the right eye 2 (two) times daily.     CINNAMON PO Take 2 tablets by mouth in the morning, at noon, and at bedtime.      Cyanocobalamin (B-12 PO) Take 1 tablet by mouth every evening.     glimepiride (AMARYL) 1 MG tablet TAKE 2 TABLETS BY MOUTH ONCE DAILY BEFORE BREAKFAST 90 tablet 2   hydrochlorothiazide (HYDRODIURIL) 12.5 MG tablet Take 1 tablet by mouth once daily 90 tablet 1   Insulin Lispro Prot & Lispro (HUMALOG MIX 75/25 KWIKPEN) (75-25) 100 UNIT/ML Kwikpen Inject 15 Units into the skin daily with breakfast. (Patient taking differently: Inject 8 Units into the  skin daily.) 15 mL 2   Insulin Pen Needle (ULTRA-THIN II MINI PEN NEEDLE) 31G X 5 MM MISC USE AS DIRECTED 50 each 2   JARDIANCE 25 MG TABS tablet TAKE 1 TABLET BY MOUTH ONCE DAILY BEFORE BREAKFAST 90 tablet 3   Lancets (ONETOUCH DELICA PLUS [id number]) MISC Use to check blood sugars twice daily. 100 each 5   metFORMIN (GLUCOPHAGE) 1000 MG tablet Take 1 tablet by mouth twice daily 180 tablet 3   metoprolol tartrate (LOPRESSOR) 25 MG tablet TAKE 1 & 1/2 (ONE & ONE-HALF) TABLETS BY MOUTH TWICE DAILY 270 tablet 2   ONETOUCH VERIO test strip USE TO CHECK BLOOD SUGARS ONCE DAILY 100 each 2   pantoprazole (PROTONIX) 40 MG tablet Take 1 tablet by mouth once daily 90 tablet 3   Probiotic Product ([employer] SENIOR PROBIOTIC PO) Take 1 capsule by mouth every evening.     Propylene Glycol (SYSTANE BALANCE) 0.6 % SOLN Place 1 drop into the left eye 3 (three) times daily as needed (dry/irritated eyes).     pyridoxine (B-6) 100 MG tablet Take 100 mg by mouth every evening.      repaglinide (PRANDIN) 2 MG tablet TAKE 1 TABLET BY MOUTH ONCE DAILY BEFORE BREAKFAST 90 tablet 2   telmisartan (MICARDIS) 80 MG tablet Take 1 tablet by mouth once daily 90 tablet 3   XARELTO 2.5 MG TABS tablet Take 1 tablet by mouth twice daily 60 tablet 5    Results  for orders placed or performed during the hospital encounter of 10/12/22 (from the past 48 hour(s))  Glucose, capillary     Status: None   Collection Time: 10/12/22  6:39 AM  Result Value Ref Range   Glucose-Capillary 91 70 - 99 mg/dL    Comment: Glucose reference range applies only to samples taken after fasting for at least 8 hours.   No results found.  Review of Systems  Blood pressure (!) 163/54, pulse 81, temperature 97.8 F (36.6 C), temperature source Oral, resp. rate 18, height '5\' 5"'$  (1.651 m), weight 63.5 kg, SpO2 100 %. Physical Exam Vitals reviewed.  Constitutional:      General: She is not in acute distress.    Appearance: Normal appearance.  HENT:     Head: Normocephalic and atraumatic.  Eyes:     General: No scleral icterus.    Conjunctiva/sclera: Conjunctivae normal.  Pulmonary:     Effort: Pulmonary effort is normal. No respiratory distress.  Abdominal:     General: There is no distension.     Palpations: Abdomen is soft.     Tenderness: There is no abdominal tenderness.  Musculoskeletal:        General: Normal range of motion.  Skin:    General: Skin is warm and dry.     Coloration: Skin is not jaundiced.  Neurological:     General: No focal deficit present.     Mental Status: She is alert and oriented to person, place, and time.  Psychiatric:        Mood and Affect: Mood normal.        Behavior: Behavior normal.      Assessment/Plan 82 yo female with symptomatic cholelithiasis, and likely prior cholecystitis with secondary colonic inflammation. Proceed to the OR for laparoscopic cholecystectomy. Informed consent obtained. Plan for discharge home from PACU. Ok to resume Xarelto in 24 hours postop.  Dwan Bolt, MD 10/12/2022, 7:32 AM

## 2022-10-12 NOTE — Discharge Instructions (Addendum)
[facility] DISCHARGE INSTRUCTIONS  Activity No heavy lifting greater than 15 pounds for 4 weeks after surgery. Ok to shower in 24 hours, but do not bathe or submerge incisions underwater. Do not drive while taking narcotic pain medication.  Wound Care Your incisions are covered with skin glue called Dermabond. This will peel off on its own over time. You may shower and allow warm soapy water to run over your incisions. Gently pat dry. Do not submerge your incision underwater. Monitor your incision for any new redness, tenderness, or drainage.  [facility] DRAIN CARE INSTRUCTIONS Wash your hands prior to caring for your drain. Uncap the bulb to release the suction. Pour out the bulb contents into a measuring cup and record the amount. Squeeze the bulb and replace the cap to apply suction again. You should empty your drain each time you notice the bulb is full. Empty at least once a day and more often when the bulb fills up.  Please keep a daily log of your drain output and bring this with you when you come to clinic.  When to Call us: Fever greater than 100.5 New redness, drainage, or swelling at incision site Severe pain, nausea, or vomiting Jaundice (yellowing of the whites of the eyes or skin)  Follow-up You will have an appointment scheduled with Dr. Zenia Resides on January 30 at 10:50am. This will be at the [facility] Surgery office at [address], [place], [place], [place]. Please arrive at least 15 minutes prior to your scheduled appointment time.  For questions or concerns, please call the office at [phone].

## 2022-10-12 NOTE — Anesthesia Procedure Notes (Signed)
Procedure Name: Intubation Date/Time: 10/12/2022 8:52 AM  Performed by: Lind Guest, CRNAPre-anesthesia Checklist: Patient identified, Emergency Drugs available, Suction available, Patient being monitored and Timeout performed Patient Re-evaluated:Patient Re-evaluated prior to induction Oxygen Delivery Method: Circle system utilized Preoxygenation: Pre-oxygenation with 100% oxygen Induction Type: IV induction Ventilation: Mask ventilation without difficulty Laryngoscope Size: Mac and 3 Grade View: Grade I Tube type: Oral Tube size: 7.0 mm Number of attempts: 1 Placement Confirmation: ETT inserted through vocal cords under direct vision, positive ETCO2 and breath sounds checked- equal and bilateral Secured at: 22 cm Tube secured with: Tape Dental Injury: Teeth and Oropharynx as per pre-operative assessment

## 2022-10-12 NOTE — Transfer of Care (Signed)
Immediate Anesthesia Transfer of Care Note  Patient: [name]  Procedure(s) Performed: LAPAROSCOPIC FENESTRATED CHOLECYSTECTOMY (Abdomen)  Patient Location: PACU  Anesthesia Type:General  Level of Consciousness: alert , oriented, and patient cooperative  Airway & Oxygen Therapy: Patient Spontanous Breathing  Post-op Assessment: Report given to RN and Post -op Vital signs reviewed and stable  Post vital signs: Reviewed and stable  Last Vitals:  Vitals Value Taken Time  BP 174/63 10/12/22 1036  Temp    Pulse 80 10/12/22 1038  Resp 14 10/12/22 1038  SpO2 100 % 10/12/22 1038  Vitals shown include unvalidated device data.  Last Pain:  Vitals:   10/12/22 0735  TempSrc:   PainSc: 0-No pain      Patients Stated Pain Goal: 3 (28/31/51 7616)  Complications: No notable events documented.

## 2022-10-12 NOTE — Op Note (Signed)
Date: 10/12/22  Patient: [name] MRN: [id number]  Preoperative Diagnosis: Symptomatic cholelithiasis Postoperative Diagnosis: Cholelithiasis with severe chronic cholecystitis  Procedure: Laparoscopic partial fenestrating cholecystectomy  Surgeon: Michaelle Birks, MD Assistant: Eben Burow, MD (Resident)  EBL: Minimal  Anesthesia: General endotracheal  Specimens: Gallstone  Indications: [name] is an 82 yo female who was referred with RUQ abdominal pain and a possible liver mass. MRI with Eovist showed only inflammation adjacent to the gallbladder, but no findings concerning for neoplasm. The patient had symptoms consistent with biliary colic, and after a discussion of the risks and benefits of surgery agreed to proceed with cholecystectomy.  Findings: Severe chronic cholecystitis with contraction of the gallbladder around a single large stone. The colon and duodenum were both adherent to the gallbladder, preventing a safe critical view without significant risk of injury to the duodenum. The colon was able to be separated from the gallbladder, and a serosal tear was oversewn. Fenestrating cholecystectomy was performed with placement of 19-Fr drain adjacent to the remnant gallbladder.  Procedure details: Informed consent was obtained in the preoperative area prior to the procedure. The patient was brought to the operating room and placed on the table in the supine position. General anesthesia was induced and appropriate lines and drains were placed for intraoperative monitoring. Perioperative antibiotics were administered per SCIP guidelines. The abdomen was prepped and draped in the usual sterile fashion. A pre-procedure timeout was taken verifying patient identity, surgical site and procedure to be performed.  A small supraumbilical skin incision was made, the subcutaneous tissue was divided with cautery, and the umbilical stalk was grasped and elevated. The fascia was  incised and the peritoneal cavity was directly visualized. A 63m Hassan trocar was placed and the abdomen was insufflated. The peritoneal cavity was inspected with no evidence of visceral or vascular injury. Three 558mports were placed in the right subcostal margin, all under direct visualization.  The gallbladder was not immediately visible, as the transverse colon was adherent over the gallbladder.  The liver was gently retracted with a grasper, and the colon was very carefully separated from the gallbladder using a combination of sharp and blunt dissection.  The gallbladder appeared severely contracted.  Medially, the pylorus and proximal duodenum were adherent to the infundibulum of the gallbladder.  The colon was further dissected off the gallbladder.  The gallbladder was inadvertently opened with cautery and a large intraluminal stone was immediately visualized.  The stone was extracted.  The lumen of the gallbladder was very small and the cystic duct orifice was immediately visible with drainage of bile.  Medially, the duodenum was densely adherent to the gallbladder preventing visualization of the cystic triangle.  I did not feel that a critical view could safely be obtained without significant risk of a large injury to the duodenum and possibly the common bile duct. I asked a partner to come into the case briefly, who also agreed. Thus the decision was made to perform a fenestrating cholecystectomy.  The gallbladder was further opened with cautery.  The mucosa was fulgurated with cautery.  The colon was closely inspected, and there was a serosal injury but no full-thickness injury.  The serosal injury was oversewn with 3-0 silk Lembert sutures.  The cystic duct orifice was then closed in the lumen of the gallbladder with a 3-0 silk figure-of-eight suture.  There was no obvious leakage of bile at this point.  The abdomen was irrigated and appeared hemostatic.  The colon was again closely examine and  no  further injuries were identified.  The proximal duodenum was closely inspected and there were no signs of injury.  A 19 [place] JP drain was placed through the right lateral port and placed adjacent to the remnant gallbladder.  It was secured to the skin with 2-0 nylon suture.  The extracted gallstone was placed in an  Ecosac and removed via the umbilical port site.  The remaining ports were removed under direct visualization and the abdomen was desufflated. The umbilical port site fascia was closed with 0 vicryl figure-of-eight suture. The skin at all port sites was closed with 4-0 monocryl subcuticular suture. Dermabond was applied.  The patient tolerated the procedure well with no apparent complications.  All counts were correct x2 at the end of the procedure. The patient was extubated and taken to PACU in stable condition.  Michaelle Birks, MD 10/12/22 10:35 AM

## 2022-10-13 ENCOUNTER — Encounter (HOSPITAL_COMMUNITY): Payer: Self-pay | Admitting: Surgery

## 2022-10-13 DIAGNOSIS — Z951 Presence of aortocoronary bypass graft: Secondary | ICD-10-CM | POA: Diagnosis not present

## 2022-10-13 DIAGNOSIS — K8012 Calculus of gallbladder with acute and chronic cholecystitis without obstruction: Secondary | ICD-10-CM | POA: Diagnosis not present

## 2022-10-13 DIAGNOSIS — Z85828 Personal history of other malignant neoplasm of skin: Secondary | ICD-10-CM | POA: Diagnosis not present

## 2022-10-13 DIAGNOSIS — Z87891 Personal history of nicotine dependence: Secondary | ICD-10-CM | POA: Diagnosis not present

## 2022-10-13 DIAGNOSIS — I10 Essential (primary) hypertension: Secondary | ICD-10-CM | POA: Diagnosis not present

## 2022-10-13 DIAGNOSIS — E119 Type 2 diabetes mellitus without complications: Secondary | ICD-10-CM | POA: Diagnosis not present

## 2022-10-13 LAB — COMPREHENSIVE METABOLIC PANEL
ALT: 47 U/L — ABNORMAL HIGH (ref 0–44)
AST: 50 U/L — ABNORMAL HIGH (ref 15–41)
Albumin: 3.3 g/dL — ABNORMAL LOW (ref 3.5–5.0)
Alkaline Phosphatase: 58 U/L (ref 38–126)
Anion gap: 8 (ref 5–15)
BUN: 27 mg/dL — ABNORMAL HIGH (ref 8–23)
CO2: 25 mmol/L (ref 22–32)
Calcium: 9.1 mg/dL (ref 8.9–10.3)
Chloride: 103 mmol/L (ref 98–111)
Creatinine, Ser: 1.33 mg/dL — ABNORMAL HIGH (ref 0.44–1.00)
GFR, Estimated: 40 mL/min — ABNORMAL LOW (ref >60)
Glucose, Bld: 223 mg/dL — ABNORMAL HIGH (ref 70–99)
Potassium: 4.7 mmol/L (ref 3.5–5.1)
Sodium: 136 mmol/L (ref 135–145)
Total Bilirubin: 0.6 mg/dL (ref 0.3–1.2)
Total Protein: 6.1 g/dL — ABNORMAL LOW (ref 6.5–8.1)

## 2022-10-13 LAB — CBC
HCT: 33.6 % — ABNORMAL LOW (ref 36.0–46.0)
Hemoglobin: 10.8 g/dL — ABNORMAL LOW (ref 12.0–15.0)
MCH: 28.9 pg (ref 26.0–34.0)
MCHC: 32.1 g/dL (ref 30.0–36.0)
MCV: 89.8 fL (ref 80.0–100.0)
Platelets: 267 10*3/uL (ref 150–400)
RBC: 3.74 MIL/uL — ABNORMAL LOW (ref 3.87–5.11)
RDW: 14.7 % (ref 11.5–15.5)
WBC: 18 10*3/uL — ABNORMAL HIGH (ref 4.0–10.5)
nRBC: 0 % (ref 0.0–0.2)

## 2022-10-13 LAB — GLUCOSE, CAPILLARY
Glucose-Capillary: 210 mg/dL — ABNORMAL HIGH (ref 70–99)
Glucose-Capillary: 189 mg/dL — ABNORMAL HIGH (ref 70–99)
Glucose-Capillary: 253 mg/dL — ABNORMAL HIGH (ref 70–99)
Glucose-Capillary: 164 mg/dL — ABNORMAL HIGH (ref 70–99)

## 2022-10-13 MED ORDER — OXYCODONE HCL 5 MG PO TABS
5.0000 mg | ORAL_TABLET | Freq: Four times a day (QID) | ORAL | Status: DC | PRN
  Administered 2022-10-13 (×3): 5 mg via ORAL
  Filled 2022-10-13 (×3): qty 1

## 2022-10-13 NOTE — Progress Notes (Signed)
    Assessment & Plan: POD#1 - status post lap partial fenestrating cholecystectomy - Dr. Zenia Resides 10/12/2022  Regular diet  JP drain with serosanguinous  Pain well controlled  Will have patient instructed in drain care  Anticipate discharge home tomorrow        Armandina Gemma, MD [place] practice Office: [phone]        Chief Complaint: Cholecystitis  Subjective: Patient in bed, comfortable.  Tolerating regular diet.  Objective: Vital signs in last 24 hours: Temp:  [97.6 F (36.4 C)-98.3 F (36.8 C)] 98 F (36.7 C) (01/20 0754) Pulse Rate:  [61-85] 81 (01/20 0754) Resp:  [10-19] 18 (01/20 0754) BP: (121-180)/(42-66) 175/66 (01/20 0754) SpO2:  [95 %-100 %] 99 % (01/20 0754)    Intake/Output from previous day: 01/19 0701 - 01/20 0700 In: 1000 [I.V.:1000] Out: 500 [Urine:250; Drains:240; Blood:10] Intake/Output this shift: Total I/O In: 240 [P.O.:240] Out: -   Physical Exam: HEENT - sclerae clear, mucous membranes moist Neck - soft Abdomen - soft without distension; mild tenderness; JP with small serosanguinous Ext - no edema, non-tender Neuro - alert & oriented, no focal deficits  Lab Results:  Recent Labs    10/13/22 0825  WBC 18.0*  HGB 10.8*  HCT 33.6*  PLT 267   BMET Recent Labs    10/13/22 0825  NA 136  K 4.7  CL 103  CO2 25  GLUCOSE 223*  BUN 27*  CREATININE 1.33*  CALCIUM 9.1   PT/INR No results for input(s): "LABPROT", "INR" in the last 72 hours. Comprehensive Metabolic Panel:    Component Value Date/Time   NA 136 10/13/2022 0825   NA 138 10/08/2022 1327   NA 142 07/29/2020 0942   NA 141 04/06/2020 1109   K 4.7 10/13/2022 0825   K 4.6 10/08/2022 1327   CL 103 10/13/2022 0825   CL 100 10/08/2022 1327   CO2 25 10/13/2022 0825   CO2 24 10/08/2022 1327   BUN 27 (H) 10/13/2022 0825   BUN 23 10/08/2022 1327   BUN 17 07/29/2020 0942   BUN 22 04/06/2020 1109   CREATININE 1.33 (H) 10/13/2022 0825    CREATININE 1.09 (H) 10/08/2022 1327   CREATININE 0.96 10/12/2021 1644   CREATININE 0.86 12/09/2019 1136   GLUCOSE 223 (H) 10/13/2022 0825   GLUCOSE 136 (H) 10/08/2022 1327   CALCIUM 9.1 10/13/2022 0825   CALCIUM 10.2 10/08/2022 1327   AST 50 (H) 10/13/2022 0825   AST 125 (H) 06/28/2022 1705   ALT 47 (H) 10/13/2022 0825   ALT 54 (H) 06/28/2022 1705   ALKPHOS 58 10/13/2022 0825   ALKPHOS 98 06/28/2022 1705   BILITOT 0.6 10/13/2022 0825   BILITOT 0.7 06/28/2022 1705   BILITOT 0.2 07/28/2020 0945   BILITOT 0.2 06/15/2020 0905   PROT 6.1 (L) 10/13/2022 0825   PROT 7.4 06/28/2022 1705   PROT 6.3 07/28/2020 0945   PROT 6.6 06/15/2020 0905   ALBUMIN 3.3 (L) 10/13/2022 0825   ALBUMIN 4.1 06/28/2022 1705   ALBUMIN 4.2 07/28/2020 0945   ALBUMIN 4.4 06/15/2020 0905    Studies/Results: No results found.    Armandina Gemma 10/13/2022  Patient ID: [name], female   DOB: 07-30-1941, 82 y.o.   MRN: [id number]

## 2022-10-13 NOTE — Anesthesia Postprocedure Evaluation (Signed)
Anesthesia Post Note  Patient: [name]  Procedure(s) Performed: LAPAROSCOPIC PARTIAL FENESTRATING CHOLECYSTECTOMY (Abdomen)     Patient location during evaluation: PACU Anesthesia Type: General Level of consciousness: awake Pain management: pain level controlled Vital Signs Assessment: post-procedure vital signs reviewed and stable Respiratory status: spontaneous breathing, nonlabored ventilation and respiratory function stable Cardiovascular status: blood pressure returned to baseline and stable Postop Assessment: no apparent nausea or vomiting Anesthetic complications: no   No notable events documented.  Last Vitals:  Vitals:   10/13/22 0034 10/13/22 0607  BP: (!) 121/42 (!) 155/53  Pulse: 79 72  Resp: 19 16  Temp: 36.7 C 36.6 C  SpO2: 98% 100%    Last Pain:  Vitals:   10/13/22 0607  TempSrc: Oral  PainSc:                  Karyl Kinnier Bowyn Mercier

## 2022-10-14 DIAGNOSIS — Z951 Presence of aortocoronary bypass graft: Secondary | ICD-10-CM | POA: Diagnosis not present

## 2022-10-14 DIAGNOSIS — E119 Type 2 diabetes mellitus without complications: Secondary | ICD-10-CM | POA: Diagnosis not present

## 2022-10-14 DIAGNOSIS — Z87891 Personal history of nicotine dependence: Secondary | ICD-10-CM | POA: Diagnosis not present

## 2022-10-14 DIAGNOSIS — I10 Essential (primary) hypertension: Secondary | ICD-10-CM | POA: Diagnosis not present

## 2022-10-14 DIAGNOSIS — Z85828 Personal history of other malignant neoplasm of skin: Secondary | ICD-10-CM | POA: Diagnosis not present

## 2022-10-14 DIAGNOSIS — K8012 Calculus of gallbladder with acute and chronic cholecystitis without obstruction: Secondary | ICD-10-CM | POA: Diagnosis not present

## 2022-10-14 LAB — GLUCOSE, CAPILLARY: Glucose-Capillary: 142 mg/dL — ABNORMAL HIGH (ref 70–99)

## 2022-10-14 MED ORDER — OXYCODONE HCL 5 MG PO TABS: 5.0000 mg | ORAL_TABLET | Freq: Four times a day (QID) | ORAL | 0 refills | Status: DC | PRN

## 2022-10-14 NOTE — Progress Notes (Signed)
Patient educated on JP drain management and emptying. Patient states she understands education provided to her. All questions answered at this time

## 2022-10-14 NOTE — Progress Notes (Signed)
Patient educated on dressing change and maintenance of JP drain. Patient states she understands education provided to her. All questions answered. Patient given extra dressing supplies for discharge purposes.

## 2022-10-14 NOTE — Discharge Summary (Signed)
Physician Discharge Summary   Patient ID: [name] MRN: [id number] DOB/AGE: 1940/12/30 82 y.o.  Admit date: 10/12/2022  Discharge date: 10/14/2022  Discharge Diagnoses:  Principal Problem:   Cholelithiasis Active Problems:   Symptomatic cholelithiasis   Discharged Condition: good  Hospital Course: Patient was admitted for observation following cholecystectomy.  Post op course was uncomplicated.  Pain was well controlled.  Tolerated diet.  Patient was prepared for discharge home on POD#2.  Consults: None  Treatments: surgery: fenestrated lap cholecystectomy - Dr. Zenia Resides  Discharge Exam: Blood pressure (!) 147/49, pulse 79, temperature 97.9 F (36.6 C), temperature source Oral, resp. rate 18, height '5\' 5"'$  (1.651 m), weight 63.5 kg, SpO2 99 %. HEENT - clear Neck - soft Abd - soft without distension; JP drain with serosanguinous output; wounds dry and intact  Disposition: Home  Discharge Instructions     Diet - low sodium heart healthy   Complete by: As directed    Discharge wound care:   Complete by: As directed    Change dressing at drain site prior to discharge this morning.  Patient to change dressing at home as needed.   Increase activity slowly   Complete by: As directed       Allergies as of 10/14/2022       Reactions   Versed [midazolam] Anxiety   Frantic, out of my mind, agitated    Augmentin [amoxicillin-pot Clavulanate] Other (See Comments)   Elevates potassium level   Bactrim [sulfamethoxazole-trimethoprim] Other (See Comments)   ^ K+( elevated)    Demerol [meperidine]    Delusional    Meperidine Hcl    Other reaction(s): delusional   Scopolamine    Delusional    Tramadol Other (See Comments)   Keeps awake        Medication List     TAKE these medications    acetaminophen 500 MG tablet Commonly known as: TYLENOL Take 1 tablet (500 mg total) by mouth every 8 (eight) hours as needed for mild pain.   atorvastatin 80 MG  tablet Commonly known as: LIPITOR TAKE 1 TABLET BY MOUTH ONCE DAILY. MUST  KEEP  UPCOMING  APPOINTMENT  FOR  FUTURE  REFILLS   B-12 PO Take 1 tablet by mouth every evening.   brimonidine 0.2 % ophthalmic solution Commonly known as: ALPHAGAN Place 1 drop into the right eye 2 (two) times daily.   CINNAMON PO Take 2 tablets by mouth in the morning, at noon, and at bedtime.   [employer] SENIOR PROBIOTIC PO Take 1 capsule by mouth every evening.   glimepiride 1 MG tablet Commonly known as: AMARYL TAKE 2 TABLETS BY MOUTH ONCE DAILY BEFORE BREAKFAST   hydrochlorothiazide 12.5 MG tablet Commonly known as: HYDRODIURIL Take 1 tablet by mouth once daily   Insulin Lispro Prot & Lispro (75-25) 100 UNIT/ML Kwikpen Commonly known as: HumaLOG Mix 75/25 KwikPen Inject 15 Units into the skin daily with breakfast. What changed:  how much to take how to take this when to take this additional instructions   Jardiance 25 MG Tabs tablet Generic drug: empagliflozin TAKE 1 TABLET BY MOUTH ONCE DAILY BEFORE BREAKFAST   metFORMIN 1000 MG tablet Commonly known as: GLUCOPHAGE Take 1 tablet by mouth twice daily   metoprolol tartrate 25 MG tablet Commonly known as: LOPRESSOR TAKE 1 & 1/2 (ONE & ONE-HALF) TABLETS BY MOUTH TWICE DAILY   OneTouch Delica Plus [id number] Misc Use to check blood sugars twice daily.   OneTouch Verio test strip  Generic drug: glucose blood USE TO CHECK BLOOD SUGARS ONCE DAILY   oxyCODONE 5 MG immediate release tablet Commonly known as: Oxy IR/ROXICODONE Take 1 tablet (5 mg total) by mouth every 6 (six) hours as needed for moderate pain.   pantoprazole 40 MG tablet Commonly known as: PROTONIX Take 1 tablet by mouth once daily   pyridoxine 100 MG tablet Commonly known as: B-6 Take 100 mg by mouth every evening.   repaglinide 2 MG tablet Commonly known as: PRANDIN TAKE 1 TABLET BY MOUTH ONCE DAILY BEFORE BREAKFAST   Systane Balance 0.6 % Soln Generic drug:  Propylene Glycol Place 1 drop into the left eye 3 (three) times daily as needed (dry/irritated eyes).   telmisartan 80 MG tablet Commonly known as: MICARDIS Take 1 tablet by mouth once daily   Ultra-Thin II Mini Pen Needle 31G X 5 MM Misc Generic drug: Insulin Pen Needle USE AS DIRECTED   Xarelto 2.5 MG Tabs tablet Generic drug: rivaroxaban Take 1 tablet by mouth twice daily               Discharge Care Instructions  (From admission, onward)           Start     Ordered   10/14/22 0000  Discharge wound care:       Comments: Change dressing at drain site prior to discharge this morning.  Patient to change dressing at home as needed.   10/14/22 1004            Follow-up Information     Dwan Bolt, MD. Schedule an appointment as soon as possible for a visit in 3 day(s).   Specialty: General Surgery Why: For wound re-check Contact information: [address] [place] [address] [phone]                 Stony Stegmann, [facility] Office: [phone]   Signed: Armandina Gemma 10/14/2022, 10:04 AM

## 2022-10-16 LAB — SURGICAL PATHOLOGY

## 2022-10-19 ENCOUNTER — Other Ambulatory Visit: Payer: Self-pay | Admitting: Vascular Surgery

## 2022-10-23 NOTE — Progress Notes (Addendum)
Fax received on 09/13/22 from [facility] for lap cholecystectomy for medical clearance/medication hold for Xarelto to be signed by Dr. Stanford Breed.  Provider signed on 10/23/22, form faxed back to sender, verified successful, sent to scan center.

## 2022-10-29 ENCOUNTER — Other Ambulatory Visit: Payer: Self-pay | Admitting: *Deleted

## 2022-10-29 DIAGNOSIS — I70235 Atherosclerosis of native arteries of right leg with ulceration of other part of foot: Secondary | ICD-10-CM

## 2022-10-29 DIAGNOSIS — I739 Peripheral vascular disease, unspecified: Secondary | ICD-10-CM

## 2022-10-30 ENCOUNTER — Ambulatory Visit (INDEPENDENT_AMBULATORY_CARE_PROVIDER_SITE_OTHER)
Admission: RE | Admit: 2022-10-30 | Discharge: 2022-10-30 | Disposition: A | Discharge status: Home / Self Care | Payer: Medicare Other | Source: Ambulatory Visit | Attending: Vascular Surgery | Admitting: Vascular Surgery

## 2022-10-30 ENCOUNTER — Ambulatory Visit (INDEPENDENT_AMBULATORY_CARE_PROVIDER_SITE_OTHER): Payer: Medicare Other | Admitting: Vascular Surgery

## 2022-10-30 ENCOUNTER — Encounter: Payer: Self-pay | Admitting: Vascular Surgery

## 2022-10-30 ENCOUNTER — Ambulatory Visit (HOSPITAL_COMMUNITY)
Admission: RE | Admit: 2022-10-30 | Discharge: 2022-10-30 | Disposition: A | Discharge status: Home / Self Care | Payer: Medicare Other | Source: Ambulatory Visit | Attending: Vascular Surgery | Admitting: Vascular Surgery

## 2022-10-30 VITALS — BP 169/69 | HR 78 | Temp 97.9°F | Resp 20 | Ht 65.0 in | Wt 142.0 lb

## 2022-10-30 DIAGNOSIS — I739 Peripheral vascular disease, unspecified: Secondary | ICD-10-CM

## 2022-10-30 DIAGNOSIS — I70235 Atherosclerosis of native arteries of right leg with ulceration of other part of foot: Secondary | ICD-10-CM

## 2022-10-30 LAB — VAS US ABI WITH/WO TBI: Left ABI: 0.83

## 2022-10-30 NOTE — Progress Notes (Signed)
[facility] [place]  ASSESSMENT / PLAN: 81 y.o. female with complex past vascular history. She has concerning features on duplex today of bilateral lower extremity bypasses. The velocities in the left are <40, suggesting threatened bypass. She has severe stenosis in the right graft as well. Plan angiography to try to salvage the bypass grafts. Will focus on the left bypass Friday 11/09/22.   CHIEF COMPLAINT: surveillance of PAD  HISTORY OF PRESENT ILLNESS: [name] is a 81 y.o. female very well-known to me.  She returns to clinic after endovascular intervention in her right lower extremity to salvage her right femoral-popliteal bypass graft.  She tolerated this very well.  She did receive Versed, which he has a poor reaction to in the past.  She required observation overnight.  She was discharged next day without difficulty.  She is getting IV antibiotics.  She is getting hyperbaric oxygen therapy.  These both seem to be helping her foot.  She feels well overall.  02/13/22: Patient returns to clinic for follow-up.  Thankfully both feet are completely healed.  She is due to be seen by the podiatry team for foot orthotics.  She is very happy overall.  10/30/22: Doing well on surveillance. No new symptoms. No ulcers about feet. 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 Has recovered and is back to playing pool.  VASCULAR SURGICAL HISTORY:  07/21/19: Right external iliac, common femoral, profundofemoral, and superficial femoral endarterectomy with bovine pericardial patch angioplasty 08/16/20: Right common femoral endarterectomy with bovine pericardial patch angioplasty; Right common femoral to above-knee popliteal artery bypass with 6 mm ringed PTFE 04/28/21: LLE angiogram 05/03/21: left common femoral - tibioperoneal trunk bypass with 6mm ringed ePTFE in anatomic tunnel; left third toe open amputation 05/11/21: Open left transmetatarsal amputation 09/07/21:  Angioplasty of left lower extremity distal bypass anastomosis and anterior tibial artery (3 mm Sterling) 10/13/21: Right iliofemoral drug-coated angioplasty (6x60mm Ranger); Right popliteal drug-coated angioplasty (4x150mm Ranger); Right tibioperoneal trunk and peroneal angioplasty (3x60mm Sterling)  Past Medical History:  Diagnosis Date   Anemia    after CABG in june 2021   Arthritis    Cancer (HCC)    removal from nose - MOSE procedure    Complication of anesthesia    VERSED- agitated, muscle spasms, "jerking" , frantic , (never had this occurence in the pas)    Coronary artery disease    Diabetes mellitus without complication (HCC)    Dysrhythmia    PVC's   GERD (gastroesophageal reflux disease)    Heart murmur    History of hiatal hernia    Hyperlipidemia    Hypertension 11/20/11   ECHO- EF>55% Borderline concentric left ventricular hypertrophy. There is a small calcified mass in the L:A near the LA appendage. No valvular masses seen with associated mitral annular calcification. LA Volume/ BSA27.4 ml/m2 No AS. Right ventricular systolic pressure is elevated at 33mmHg.   Myocardial infarction (HCC)    June 2021   Neuromuscular disorder (HCC)    neuropathy in bilateral feet   Peripheral vascular disease (HCC)    Pneumonia    not hosp.    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  PTCAs with drug coated balloon Lt ant tibial artery and Lt popliteal artery  11/26/2019    Past Surgical History:  Procedure Laterality Date   ABDOMINAL AORTAGRAM  11/25/2019   ABDOMINAL AORTOGRAM W/LOWER EXTREMITY    ABDOMINAL AORTOGRAM N/A 06/15/2019   Procedure: ABDOMINAL AORTOGRAM;  Surgeon:   Berry, Jonathan J, MD;  [facility];  Service: Cardiovascular;  Laterality: N/A;   ABDOMINAL AORTOGRAM W/LOWER EXTREMITY N/A 11/25/2019   Procedure: ABDOMINAL AORTOGRAM W/LOWER EXTREMITY;  Surgeon: Arida, Muhammad A, MD;  [facility]: [facility];  Service: Cardiovascular;   Laterality: N/A;  Lt leg   ABDOMINAL AORTOGRAM W/LOWER EXTREMITY N/A 08/01/2020   Procedure: ABDOMINAL AORTOGRAM W/LOWER EXTREMITY;  Surgeon: Berry, Jonathan J, MD;  [facility];  Service: Cardiovascular;  Laterality: N/A;   ABDOMINAL AORTOGRAM W/LOWER EXTREMITY N/A 04/28/2021   Procedure: ABDOMINAL AORTOGRAM W/LOWER EXTREMITY;  Surgeon: Fields, Charles E, MD;  [facility];  Service: Cardiovascular;  Laterality: N/A;   ABDOMINAL AORTOGRAM W/LOWER EXTREMITY N/A 10/13/2021   Procedure: ABDOMINAL AORTOGRAM W/LOWER EXTREMITY;  Surgeon: Hennesy Sobalvarro N, MD;  [facility];  Service: Cardiovascular;  Laterality: N/A;   AMPUTATION TOE Left 05/03/2021   Procedure: AMPUTATION OF THIRD LEFT  TOE;  Surgeon: Rushie Brazel N, MD;  [facility];  Service: Vascular;  Laterality: Left;   APPENDECTOMY     APPLICATION OF WOUND VAC Right 07/21/2019   Procedure: Application Of Prevena Wound Vac Right Groin;  Surgeon: Brabham, Vance W, MD;  [facility];  Service: Vascular;  Laterality: Right;   CARPAL TUNNEL RELEASE Left    CARPAL TUNNEL RELEASE Right    CESAREAN SECTION     x 2   CHOLECYSTECTOMY N/A 10/12/2022   Procedure: LAPAROSCOPIC PARTIAL FENESTRATING CHOLECYSTECTOMY;  Surgeon: Allen, Shelby L, MD;  [facility];  Service: General;  Laterality: N/A;   COLONOSCOPY     CORONARY ARTERY BYPASS GRAFT N/A 03/24/2020   Procedure: CORONARY ARTERY BYPASS GRAFTING (CABG) using LIMA to LAD (m); RIMA to RAMUS; Endoscopic Right Greater Saphenous Vein: SVG to Diag1; SVG to PLB (right); and SVG to PL (left).;  Surgeon: Atkins, Broadus Z, MD;  [facility];  Service: Open Heart Surgery;  Laterality: N/A;  BILATERAL IMA   ENDARTERECTOMY FEMORAL Right 07/21/2019   Procedure: RIGHT ENDARTERECTOMY FEMORAL WITH PATCH ANGIOPLASTY;  Surgeon: Brabham, Vance W, MD;  [facility];  Service: Vascular;  Laterality: Right;   ENDARTERECTOMY FEMORAL Right 08/16/2020    Procedure: RIGHT FEMORAL ENDARTERECTOMY;  Surgeon: Cain, Brandon Christopher, MD;  [facility];  Service: Vascular;  Laterality: Right;   ENDOVEIN HARVEST OF GREATER SAPHENOUS VEIN Right 03/24/2020   Procedure: ENDOVEIN HARVEST OF GREATER SAPHENOUS VEIN;  Surgeon: Atkins, Broadus Z, MD;  [facility];  Service: Open Heart Surgery;  Laterality: Right;   EYE SURGERY     cataract removal bilaterally   FEMORAL-POPLITEAL BYPASS GRAFT Right 08/16/2020   Procedure: BYPASS GRAFT FEMORAL-POPLITEAL ARTERY;  Surgeon: Cain, Brandon Christopher, MD;  [facility];  Service: Vascular;  Laterality: Right;   FEMORAL-POPLITEAL BYPASS GRAFT Left 05/03/2021   Procedure: LEFT FEMORAL TO BELOW KNEE POPLITEAL ARTERY BYPASS GRAFTING WITH 6MMX80 PTFE GRAFT;  Surgeon: Sehaj Mcenroe N, MD;  [facility];  Service: Vascular;  Laterality: Left;   INTRAVASCULAR LITHOTRIPSY  11/25/2019   Procedure: INTRAVASCULAR LITHOTRIPSY;  Surgeon: Arida, Muhammad A, MD;  [facility];  Service: Cardiovascular;;  LT. SFA   LEFT HEART CATH AND CORONARY ANGIOGRAPHY N/A 03/22/2020   Procedure: LEFT HEART CATH AND CORONARY ANGIOGRAPHY;  Surgeon: Jordan, Peter M, MD;  [facility];  Service: Cardiovascular;  Laterality: N/A;   LOWER EXTREMITY ANGIOGRAPHY Bilateral 06/15/2019   Procedure: Lower Extremity Angiography;  Surgeon: Berry,   Jonathan J, MD;  [facility];  Service: Cardiovascular;  Laterality: Bilateral;   LOWER EXTREMITY ANGIOGRAPHY Right 08/03/2019   Procedure: LOWER EXTREMITY ANGIOGRAPHY;  Surgeon: Berry, Jonathan J, MD;  [facility];  Service: Cardiovascular;  Laterality: Right;   LOWER EXTREMITY ANGIOGRAPHY N/A 09/07/2021   Procedure: LOWER EXTREMITY ANGIOGRAPHY;  Surgeon: Clark, Christopher J, MD;  [facility];  Service: Cardiovascular;  Laterality: N/A;   PATCH ANGIOPLASTY Right 07/21/2019   Procedure: Patch Angioplasty Right Femoral  Artery;  Surgeon: Brabham, Vance W, MD;  [facility];  Service: Vascular;  Laterality: Right;   PERIPHERAL VASCULAR ATHERECTOMY  08/03/2019   Procedure: PERIPHERAL VASCULAR ATHERECTOMY;  Surgeon: Berry, Jonathan J, MD;  [facility];  Service: Cardiovascular;;  right SFA, right TP trunk   PERIPHERAL VASCULAR ATHERECTOMY  11/25/2019   Procedure: PERIPHERAL VASCULAR ATHERECTOMY;  Surgeon: Arida, Muhammad A, MD;  [facility];  Service: Cardiovascular;;  Lt.  POPLITEAL and AT   PERIPHERAL VASCULAR BALLOON ANGIOPLASTY Left 06/15/2019   Procedure: PERIPHERAL VASCULAR BALLOON ANGIOPLASTY;  Surgeon: Berry, Jonathan J, MD;  [facility];  Service: Cardiovascular;  Laterality: Left;  SFA UNSUCCESSFUL UNABLE TO CROSS LESION   PERIPHERAL VASCULAR BALLOON ANGIOPLASTY  08/03/2019   Procedure: PERIPHERAL VASCULAR BALLOON ANGIOPLASTY;  Surgeon: Berry, Jonathan J, MD;  [facility];  Service: Cardiovascular;;  right SFA, Right TP trunk   PERIPHERAL VASCULAR BALLOON ANGIOPLASTY  09/07/2021   Procedure: PERIPHERAL VASCULAR BALLOON ANGIOPLASTY;  Surgeon: Clark, Christopher J, MD;  [facility];  Service: Cardiovascular;;   PERIPHERAL VASCULAR BALLOON ANGIOPLASTY Right 10/13/2021   Procedure: PERIPHERAL VASCULAR BALLOON ANGIOPLASTY;  Surgeon: Ladiamond Gallina N, MD;  [facility];  Service: Cardiovascular;  Laterality: Right;   TEE WITHOUT CARDIOVERSION N/A 03/24/2020   Procedure: TRANSESOPHAGEAL ECHOCARDIOGRAM (TEE);  Surgeon: Atkins, Broadus Z, MD;  [facility];  Service: Open Heart Surgery;  Laterality: N/A;   TONSILLECTOMY     and adenoidectomy   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/2019   Procedure: TRANSMETATARSAL AMPUTATION;  Surgeon: Wagoner, Matthew R, DPM;  [facility];  Service: Podiatry;  Laterality: Right;   TRANSMETATARSAL AMPUTATION Left 05/11/2021   Procedure: LEFT TRANSMETATARSAL AMPUTATION;   Surgeon: Cain, Brandon Christopher, MD;  [facility];  Service: Vascular;  Laterality: Left;   TUBAL LIGATION      Family History  Problem Relation Age of Onset   Cancer - Prostate Father    Cancer - Colon Father    Stroke Mother    Hypertension Mother    Hyperlipidemia Mother    Melanoma Brother     Social History   Socioeconomic History   Marital status: Widowed    Spouse name: Not on file   Number of children: 3   Years of education: Not on file   Highest education level: Not on file  Occupational History   Not on file  Tobacco Use   Smoking status: Former    Types: Cigarettes    Quit date: 03/31/1972    Years since quitting: 50.6   Smokeless tobacco: Never  Vaping Use   Vaping Use: Never used  Substance and Sexual Activity   Alcohol use: No   Drug use: No   Sexual activity: Not on file  Other Topics Concern   Not on file  Social History Narrative   Not on file   Social Determinants of Health     Financial Resource Strain: Not on file  Food Insecurity: No Food Insecurity (10/12/2022)   Hunger Vital Sign    Worried About Running Out of Food in the Last Year: Never true    Ran Out of Food in the Last Year: Never true  Transportation Needs: No Transportation Needs (10/12/2022)   PRAPARE - Transportation    Lack of Transportation (Medical): No    Lack of Transportation (Non-Medical): No  Physical Activity: Not on file  Stress: Not on file  Social Connections: Not on file  Intimate Partner Violence: Not At Risk (10/12/2022)   Humiliation, Afraid, Rape, and Kick questionnaire    Fear of Current or Ex-Partner: No    Emotionally Abused: No    Physically Abused: No    Sexually Abused: No    Allergies  Allergen Reactions   Versed [Midazolam] Anxiety    Frantic, out of my mind, agitated    Augmentin [Amoxicillin-Pot Clavulanate] Other (See Comments)    Elevates potassium level   Bactrim [Sulfamethoxazole-Trimethoprim] Other (See Comments)    ^ K+( elevated)     Demerol [Meperidine]     Delusional    Meperidine Hcl     Other reaction(s): delusional   Scopolamine     Delusional    Tramadol Other (See Comments)    Keeps awake    Current Outpatient Medications  Medication Sig Dispense Refill   acetaminophen (TYLENOL) 500 MG tablet Take 1 tablet (500 mg total) by mouth every 8 (eight) hours as needed for mild pain. 30 tablet 0   atorvastatin (LIPITOR) 80 MG tablet TAKE 1 TABLET BY MOUTH ONCE DAILY. MUST  KEEP  UPCOMING  APPOINTMENT  FOR  FUTURE  REFILLS 90 tablet 3   brimonidine (ALPHAGAN) 0.2 % ophthalmic solution Place 1 drop into the right eye 2 (two) times daily.     CINNAMON PO Take 2 tablets by mouth in the morning, at noon, and at bedtime.      Cyanocobalamin (B-12 PO) Take 1 tablet by mouth every evening.     glimepiride (AMARYL) 1 MG tablet TAKE 2 TABLETS BY MOUTH ONCE DAILY BEFORE BREAKFAST 90 tablet 2   hydrochlorothiazide (HYDRODIURIL) 12.5 MG tablet Take 1 tablet by mouth once daily 90 tablet 1   Insulin Lispro Prot & Lispro (HUMALOG MIX 75/25 KWIKPEN) (75-25) 100 UNIT/ML Kwikpen Inject 15 Units into the skin daily with breakfast. (Patient taking differently: Inject 8 Units into the skin daily.) 15 mL 2   Insulin Pen Needle (ULTRA-THIN II MINI PEN NEEDLE) 31G X 5 MM MISC USE AS DIRECTED 50 each 2   JARDIANCE 25 MG TABS tablet TAKE 1 TABLET BY MOUTH ONCE DAILY BEFORE BREAKFAST 90 tablet 3   Lancets (ONETOUCH DELICA PLUS LANCET33G) MISC Use to check blood sugars twice daily. 100 each 5   metFORMIN (GLUCOPHAGE) 1000 MG tablet Take 1 tablet by mouth twice daily 180 tablet 3   metoprolol tartrate (LOPRESSOR) 25 MG tablet TAKE 1 & 1/2 (ONE & ONE-HALF) TABLETS BY MOUTH TWICE DAILY 270 tablet 2   ONETOUCH VERIO test strip USE TO CHECK BLOOD SUGARS ONCE DAILY 100 each 2   pantoprazole (PROTONIX) 40 MG tablet Take 1 tablet by mouth once daily 90 tablet 3   Probiotic Product ([employer] SENIOR PROBIOTIC PO) Take 1 capsule by mouth every evening.      Propylene Glycol (SYSTANE BALANCE) 0.6 % SOLN Place 1 drop into the left eye 3 (three) times daily as needed (dry/irritated eyes).     repaglinide (  PRANDIN) 2 MG tablet TAKE 1 TABLET BY MOUTH ONCE DAILY BEFORE BREAKFAST 90 tablet 2   telmisartan (MICARDIS) 80 MG tablet Take 1 tablet by mouth once daily 90 tablet 3   XARELTO 2.5 MG TABS tablet Take 1 tablet by mouth twice daily 60 tablet 0   Current Facility-Administered Medications  Medication Dose Route Frequency Provider Last Rate Last Admin   sodium chloride flush (NS) 0.9 % injection 3 mL  3 mL Intravenous Q12H Berry, Jonathan J, MD        PHYSICAL EXAM Vitals:   10/30/22 0843  BP: (!) 169/69  Pulse: 78  Resp: 20  Temp: 97.9 F (36.6 C)  SpO2: 99%  Weight: 142 lb (64.4 kg)  Height: 5' 5" (1.651 m)     Constitutional: well appearing. no distress. Appears well nourished.  Neurologic: CN intact. no focal findings. no sensory loss. Psychiatric:  Mood and affect symmetric and appropriate. Eyes:  No icterus. No conjunctival pallor. Ears, nose, throat:  mucous membranes moist. Midline trachea.  Cardiac: regular rate and rhythm.  Respiratory:  unlabored. Abdominal:  soft, non-tender, non-distended.  Peripheral vascular: no palpable pedal pulses Extremity: no edema. no cyanosis. no pallor.  Lymphatic: no Stemmer's sign. n palpable lymphadenopathy.  PERTINENT LABORATORY AND RADIOLOGIC DATA  Most recent CBC    Latest Ref Rng & Units 10/13/2022    8:25 AM 10/08/2022    1:27 PM 06/28/2022    5:05 PM  CBC  WBC 4.0 - 10.5 K/uL 18.0  12.7  14.5   Hemoglobin 12.0 - 15.0 g/dL 10.8  12.0  12.7   Hematocrit 36.0 - 46.0 % 33.6  38.4  39.6   Platelets 150 - 400 K/uL 267  336  329      Most recent CMP    Latest Ref Rng & Units 10/13/2022    8:25 AM 10/08/2022    1:27 PM 08/28/2022    9:12 AM  CMP  Glucose 70 - 99 mg/dL 223  136  167   BUN 8 - 23 mg/dL 27  23  15   Creatinine 0.44 - 1.00 mg/dL 1.33  1.09  0.86   Sodium 135 - 145  mmol/L 136  138  141   Potassium 3.5 - 5.1 mmol/L 4.7  4.6  4.0   Chloride 98 - 111 mmol/L 103  100  103   CO2 22 - 32 mmol/L 25  24  28   Calcium 8.9 - 10.3 mg/dL 9.1  10.2  9.6   Total Protein 6.5 - 8.1 g/dL 6.1     Total Bilirubin 0.3 - 1.2 mg/dL 0.6     Alkaline Phos 38 - 126 U/L 58     AST 15 - 41 U/L 50     ALT 0 - 44 U/L 47       Renal function Estimated Creatinine Clearance: 30.4 mL/min (A) (by C-G formula based on SCr of 1.33 mg/dL (H)).  Hgb A1c MFr Bld (%)  Date Value  08/28/2022 7.9 (H)    LDL (calc)  Date Value Ref Range Status  04/06/2013 87 <100 mg/dL Final    Comment:    LDL-C is inaccurate if patient is nonfasting.   Reference Range: ---------------- Optimal:            <100 Near/Above Optimal: 100-129 Borderline High:    130-159 High:               160-189 Very High:          >=  190       LDL Chol Calc (NIH)  Date Value Ref Range Status  07/28/2020 54 0 - 99 mg/dL Final   LDL Cholesterol  Date Value Ref Range Status  10/14/2021 50 0 - 99 mg/dL Final    Comment:           Total Cholesterol/HDL:CHD Risk Coronary Heart Disease Risk Table                     Men   Women  1/2 Average Risk   3.4   3.3  Average Risk       5.0   4.4  2 X Average Risk   9.6   7.1  3 X Average Risk  23.4   11.0        Use the calculated Patient Ratio above and the CHD Risk Table to determine the patient's CHD Risk.        ATP III CLASSIFICATION (LDL):  <100     mg/dL   Optimal  100-129  mg/dL   Near or Above                    Optimal  130-159  mg/dL   Borderline  160-189  mg/dL   High  >190     mg/dL   Very High Performed at [facility], [address] [place], [place] [address]      +-------+-----------+-----------+------------+------------+  ABI/TBIToday's ABIToday's TBIPrevious ABIPrevious TBI  +-------+-----------+-----------+------------+------------+  Right [place]         N/A        0.68        N/A            +-------+-----------+-----------+------------+------------+  Left  0.83       N/A        0.75        N/A           +-------+-----------+-----------+------------+------------+   Bilateral lower extremity arterial duplex  Right graft shows proximal stenosis; lowest velocity in graft is 60cm/s; outflow stenosis noted  Left graft shows proximal stenosis; lowest velocity 33cm/s; outflow stenosis.  Nillie Bartolotta N. Can Lucci, MD [facility] [place] Office Phone Number: [phone] 10/30/2022 9:40 AM  Total time spent on preparing this encounter including chart review, data review, collecting history, examining the patient, coordinating care for this established patient, 30 minutes.  Portions of this report may have been transcribed using voice recognition software.  Every effort has been made to ensure accuracy; however, inadvertent computerized transcription errors may still be present.   

## 2022-10-30 NOTE — H&P (View-Only) (Signed)
[facility] OF   ASSESSMENT / PLAN: 82 y.o. female with complex past vascular history. She has concerning features on duplex today of bilateral lower extremity bypasses. The velocities in the left are <40, suggesting threatened bypass. She has severe stenosis in the right graft as well. Plan angiography to try to salvage the bypass grafts. Will focus on the left bypass Friday 11/09/22.   CHIEF COMPLAINT: surveillance of PAD  HISTORY OF PRESENT ILLNESS: [name] is a 82 y.o. female very well-known to me.  She returns to clinic after endovascular intervention in her right lower extremity to salvage her right femoral-popliteal bypass graft.  She tolerated this very well.  She did receive Versed, which he has a poor reaction to in the past.  She required observation overnight.  She was discharged next day without difficulty.  She is getting IV antibiotics.  She is getting hyperbaric oxygen therapy.  These both seem to be helping her foot.  She feels well overall.  02/13/22: Patient returns to clinic for follow-up.  Thankfully both feet are completely healed.  She is due to be seen by the podiatry team for foot orthotics.  She is very happy overall.  10/30/22: Doing well on surveillance. No new symptoms. No ulcers about feet. 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 Has recovered and is back to playing pool.  VASCULAR SURGICAL HISTORY:  07/21/19: Right external iliac, common femoral, profundofemoral, and superficial femoral endarterectomy with bovine pericardial patch angioplasty 08/16/20: Right common femoral endarterectomy with bovine pericardial patch angioplasty; Right common femoral to above-knee popliteal artery bypass with 6 mm ringed PTFE 04/28/21: LLE angiogram 05/03/21: left common femoral - tibioperoneal trunk bypass with 90m ringed ePTFE in anatomic tunnel; left third toe open amputation 05/11/21: Open left transmetatarsal amputation 09/07/21:  Angioplasty of left lower extremity distal bypass anastomosis and anterior tibial artery (3 mm [place]) 10/13/21: Right iliofemoral drug-coated angioplasty (6x645mRanger); Right popliteal drug-coated angioplasty ([id number]); Right tibioperoneal trunk and peroneal angioplasty (3x60m50merling)  Past Medical History:  Diagnosis Date   Anemia    after CABG in june 2021   Arthritis    Cancer (HCC)[place] removal from nose - MOSE procedure    Complication of anesthesia    VERSED- agitated, muscle spasms, "jerking" , frantic , (never had this occurence in the pas)    Coronary artery disease    Diabetes mellitus without complication (HCC)[place] Dysrhythmia    PVC's   GERD (gastroesophageal reflux disease)    Heart murmur    History of hiatal hernia    Hyperlipidemia    Hypertension 11/20/11   ECHO- EF>55% Borderline concentric left ventricular hypertrophy. There is a small calcified mass in the L:A near the LA appendage. No valvular masses seen with associated mitral annular calcification. LA Volume/ BSA27.4 ml/m2 No AS. 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  PTCAs with drug coated balloon Lt ant tibial artery and Lt popliteal artery  11/26/2019    Past Surgical History:  Procedure Laterality Date   ABDOMINAL AORTAGRAM  11/25/2019   ABDOMINAL AORTOGRAM W/LOWER EXTREMITY    ABDOMINAL AORTOGRAM N/A 06/15/2019   Procedure: ABDOMINAL AORTOGRAM;  Surgeon:  [name], MD;  Location: [facility];  Service: Cardiovascular;  Laterality: N/A;   ABDOMINAL AORTOGRAM W/LOWER EXTREMITY N/A 11/25/2019   Procedure: ABDOMINAL AORTOGRAM W/LOWER EXTREMITY;  Surgeon: [name], MD;  Location: [facility];  Service: Cardiovascular;   Laterality: N/A;  Lt leg   ABDOMINAL AORTOGRAM W/LOWER EXTREMITY N/A 08/01/2020   Procedure: ABDOMINAL AORTOGRAM W/LOWER EXTREMITY;  Surgeon: [name], MD;  Location: [facility];  Service: Cardiovascular;  Laterality: N/A;   ABDOMINAL AORTOGRAM W/LOWER EXTREMITY N/A 04/28/2021   Procedure: ABDOMINAL AORTOGRAM W/LOWER EXTREMITY;  Surgeon: [name], MD;  Location: [place] [facility];  Service: Cardiovascular;  Laterality: N/A;   ABDOMINAL AORTOGRAM W/LOWER EXTREMITY N/A 10/13/2021   Procedure: ABDOMINAL AORTOGRAM W/LOWER EXTREMITY;  Surgeon: [name], MD;  Location: [facility];  Service: Cardiovascular;  Laterality: N/A;   AMPUTATION TOE Left 05/03/2021   Procedure: AMPUTATION OF THIRD LEFT  TOE;  Surgeon: [name], MD;  Location: [facility];  Service: Vascular;  Laterality: Left;   APPENDECTOMY     APPLICATION OF WOUND VAC Right 07/21/2019   Procedure: Application Of [place]ven[place] Wound Vac Right Groin;  Surgeon: [name], MD;  [facility];  Service: Vascular;  Laterality: Right;   CARPAL TUNNEL RELEASE Left    CARPAL TUNNEL RELEASE Right    CESAREAN SECTION     x 2   CHOLECYSTECTOMY N/A 10/12/2022   Procedure: LAPAROSCOPIC PARTIAL FENESTRATING CHOLECYSTECTOMY;  Surgeon: Dwan Bolt, MD;  Location: [facility];  Service: General;  Laterality: N/A;   COLONOSCOPY     CORONARY ARTERY BYPASS GRAFT N/A 03/24/2020   Procedure: CORONARY ARTERY BYPASS GRAFTING (CABG) using LIMA to LAD (m); RIMA to RAMUS; Endoscopic Right Greater Saphenous Vein: SVG to Diag1; SVG to PLB (right); and SVG to PL (left).;  Surgeon: [name], MD;  Location: [facility];  Service: Open Heart Surgery;  Laterality: N/A;  BILATERAL IMA   ENDARTERECTOMY FEMORAL Right 07/21/2019   Procedure: RIGHT ENDARTERECTOMY FEMORAL WITH PATCH ANGIOPLASTY;  Surgeon: [name], MD;  Location: [facility];  Service: Vascular;  Laterality: Right;   ENDARTERECTOMY FEMORAL Right 08/16/2020    Procedure: RIGHT FEMORAL ENDARTERECTOMY;  Surgeon: [name], MD;  Location: [facility];  Service: Vascular;  Laterality: Right;   ENDOVEIN HARVEST OF GREATER SAPHENOUS VEIN Right 03/24/2020   Procedure: ENDOVEIN HARVEST OF [place];  Surgeon: [name], MD;  Location: [place] [facility];  Service: Open Heart Surgery;  Laterality: Right;   EYE SURGERY     cataract removal bilaterally   FEMORAL-POPLITEAL BYPASS GRAFT Right 08/16/2020   Procedure: BYPASS GRAFT FEMORAL-POPLITEAL ARTERY;  Surgeon: [name], MD;  Location: [facility];  Service: Vascular;  Laterality: Right;   FEMORAL-POPLITEAL BYPASS GRAFT Left 05/03/2021   Procedure: LEFT FEMORAL TO BELOW KNEE POPLITEAL ARTERY BYPASS GRAFTING WITH 6MMX80 PTFE GRAFT;  Surgeon: [name], MD;  Location: [facility];  Service: Vascular;  Laterality: Left;   INTRAVASCULAR LITHOTRIPSY  11/25/2019   Procedure: INTRAVASCULAR LITHOTRIPSY;  Surgeon: [name], MD;  Location: [facility];  Service: Cardiovascular;;  LT. SFA   LEFT HEART CATH AND CORONARY ANGIOGRAPHY N/A 03/22/2020   Procedure: LEFT HEART CATH AND CORONARY ANGIOGRAPHY;  Surgeon: Martinique, Peter M, MD;  Location: [facility];  Service: Cardiovascular;  Laterality: N/A;   LOWER EXTREMITY ANGIOGRAPHY Bilateral 06/15/2019   Procedure: Lower Extremity Angiography;  Surgeon: Gwenlyn Found,  Pearletha Forge, MD;  Location: [facility];  Service: Cardiovascular;  Laterality: Bilateral;   LOWER EXTREMITY ANGIOGRAPHY Right 08/03/2019   Procedure: LOWER EXTREMITY ANGIOGRAPHY;  Surgeon: [name], MD;  Location: [facility];  Service: Cardiovascular;  Laterality: Right;   LOWER EXTREMITY ANGIOGRAPHY N/A 09/07/2021   Procedure: LOWER EXTREMITY ANGIOGRAPHY;  Surgeon: Marty Heck, MD;  Location: [facility];  Service: Cardiovascular;  Laterality: N/A;   PATCH ANGIOPLASTY Right 07/21/2019   Procedure: Patch Angioplasty Right Femoral  Artery;  Surgeon: [name], MD;  Location: [facility];  Service: Vascular;  Laterality: Right;   PERIPHERAL VASCULAR ATHERECTOMY  08/03/2019   Procedure: PERIPHERAL VASCULAR ATHERECTOMY;  Surgeon: [name], MD;  Location: [facility];  Service: Cardiovascular;;  right SFA, right TP trunk   PERIPHERAL VASCULAR ATHERECTOMY  11/25/2019   Procedure: PERIPHERAL VASCULAR ATHERECTOMY;  Surgeon: [name], MD;  Location: [facility];  Service: Cardiovascular;;  Lt.  POPLITEAL and AT   PERIPHERAL VASCULAR BALLOON ANGIOPLASTY Left 06/15/2019   Procedure: PERIPHERAL VASCULAR BALLOON ANGIOPLASTY;  Surgeon: [name], MD;  Location: [facility];  Service: Cardiovascular;  Laterality: Left;  SFA UNSUCCESSFUL UNABLE TO CROSS LESION   PERIPHERAL VASCULAR BALLOON ANGIOPLASTY  08/03/2019   Procedure: PERIPHERAL VASCULAR BALLOON ANGIOPLASTY;  Surgeon: [name], MD;  Location: [facility];  Service: Cardiovascular;;  right SFA, Right TP trunk   PERIPHERAL VASCULAR BALLOON ANGIOPLASTY  09/07/2021   Procedure: PERIPHERAL VASCULAR BALLOON ANGIOPLASTY;  Surgeon: Marty Heck, MD;  Location: [facility];  Service: Cardiovascular;;   PERIPHERAL VASCULAR BALLOON ANGIOPLASTY Right 10/13/2021   Procedure: PERIPHERAL VASCULAR BALLOON ANGIOPLASTY;  Surgeon: [name], MD;  Location: [facility];  Service: Cardiovascular;  Laterality: Right;   TEE WITHOUT CARDIOVERSION N/A 03/24/2020   Procedure: TRANSESOPHAGEAL ECHOCARDIOGRAM (TEE);  Surgeon: [name], MD;  Location: [facility];  Service: Open Heart Surgery;  Laterality: N/A;   TONSILLECTOMY     and adenoidectomy   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/2019   Procedure: TRANSMETATARSAL AMPUTATION;  Surgeon: Trula Slade, DPM;  Location: [facility];  Service: Podiatry;  Laterality: Right;   TRANSMETATARSAL AMPUTATION Left 05/11/2021   Procedure: LEFT TRANSMETATARSAL AMPUTATION;   Surgeon: [name], MD;  Location: [facility];  Service: Vascular;  Laterality: Left;   TUBAL LIGATION      Family History  Problem Relation Age of Onset   Cancer - Prostate Father    Cancer - Colon Father    Stroke Mother    Hypertension Mother    Hyperlipidemia Mother    Melanoma Brother     Social History   Socioeconomic History   Marital status: Widowed    Spouse name: Not on file   Number of children: 3   Years of education: Not on file   Highest education level: Not on file  Occupational History   Not on file  Tobacco Use   Smoking status: Former    Types: Cigarettes    Quit date: 03/31/1972    Years since quitting: 50.6   Smokeless tobacco: Never  Vaping Use   Vaping Use: Never used  Substance and Sexual Activity   Alcohol use: No   Drug use: No   Sexual activity: Not on file  Other Topics Concern   Not on file  Social History Narrative   Not on file   Social Determinants of Health  Financial Resource Strain: Not on file  Food Insecurity: No Food Insecurity (10/12/2022)   Hunger Vital Sign    Worried About Running Out of Food in the Last Year: Never true    Ran Out of Food in the Last Year: Never true  Transportation Needs: No Transportation Needs (10/12/2022)   PRAPARE - Hydrologist (Medical): No    Lack of Transportation (Non-Medical): No  Physical Activity: Not on file  Stress: Not on file  Social Connections: Not on file  Intimate Partner Violence: Not At Risk (10/12/2022)   Humiliation, Afraid, Rape, and Kick questionnaire    Fear of Current or Ex-Partner: No    Emotionally Abused: No    Physically Abused: No    Sexually Abused: No    Allergies  Allergen Reactions   Versed [Midazolam] Anxiety    Frantic, out of my mind, agitated    Augmentin [Amoxicillin-Pot Clavulanate] Other (See Comments)    Elevates potassium level   Bactrim [Sulfamethoxazole-Trimethoprim] Other (See Comments)    ^ K+( elevated)     Demerol [Meperidine]     Delusional    Meperidine Hcl     Other reaction(s): delusional   Scopolamine     Delusional    Tramadol Other (See Comments)    Keeps awake    Current Outpatient Medications  Medication Sig Dispense Refill   acetaminophen (TYLENOL) 500 MG tablet Take 1 tablet (500 mg total) by mouth every 8 (eight) hours as needed for mild pain. 30 tablet 0   atorvastatin (LIPITOR) 80 MG tablet TAKE 1 TABLET BY MOUTH ONCE DAILY. MUST  KEEP  UPCOMING  APPOINTMENT  FOR  FUTURE  REFILLS 90 tablet 3   brimonidine (ALPHAGAN) 0.2 % ophthalmic solution Place 1 drop into the right eye 2 (two) times daily.     CINNAMON PO Take 2 tablets by mouth in the morning, at noon, and at bedtime.      Cyanocobalamin (B-12 PO) Take 1 tablet by mouth every evening.     glimepiride (AMARYL) 1 MG tablet TAKE 2 TABLETS BY MOUTH ONCE DAILY BEFORE BREAKFAST 90 tablet 2   hydrochlorothiazide (HYDRODIURIL) 12.5 MG tablet Take 1 tablet by mouth once daily 90 tablet 1   Insulin Lispro Prot & Lispro (HUMALOG MIX 75/25 KWIKPEN) (75-25) 100 UNIT/ML Kwikpen Inject 15 Units into the skin daily with breakfast. (Patient taking differently: Inject 8 Units into the skin daily.) 15 mL 2   Insulin Pen Needle (ULTRA-THIN II MINI PEN NEEDLE) 31G X 5 MM MISC USE AS DIRECTED 50 each 2   JARDIANCE 25 MG TABS tablet TAKE 1 TABLET BY MOUTH ONCE DAILY BEFORE BREAKFAST 90 tablet 3   Lancets (ONETOUCH DELICA PLUS 123XX123) MISC Use to check blood sugars twice daily. 100 each 5   metFORMIN (GLUCOPHAGE) 1000 MG tablet Take 1 tablet by mouth twice daily 180 tablet 3   metoprolol tartrate (LOPRESSOR) 25 MG tablet TAKE 1 & 1/2 (ONE & ONE-HALF) TABLETS BY MOUTH TWICE DAILY 270 tablet 2   ONETOUCH VERIO test strip USE TO CHECK BLOOD SUGARS ONCE DAILY 100 each 2   pantoprazole (PROTONIX) 40 MG tablet Take 1 tablet by mouth once daily 90 tablet 3   Probiotic Product ([employer] SENIOR PROBIOTIC PO) Take 1 capsule by mouth every evening.      Propylene Glycol (SYSTANE BALANCE) 0.6 % SOLN Place 1 drop into the left eye 3 (three) times daily as needed (dry/irritated eyes).     repaglinide (  PRANDIN) 2 MG tablet TAKE 1 TABLET BY MOUTH ONCE DAILY BEFORE BREAKFAST 90 tablet 2   telmisartan (MICARDIS) 80 MG tablet Take 1 tablet by mouth once daily 90 tablet 3   XARELTO 2.5 MG TABS tablet Take 1 tablet by mouth twice daily 60 tablet 0   Current Facility-Administered Medications  Medication Dose Route Frequency Provider Last Rate Last Admin   sodium chloride flush (NS) 0.9 % injection 3 mL  3 mL Intravenous Q12H [name], MD        PHYSICAL EXAM Vitals:   10/30/22 0843  BP: (!) 169/69  Pulse: 78  Resp: 20  Temp: 97.9 F (36.6 C)  SpO2: 99%  Weight: 142 lb (64.4 kg)  Height: '5\' 5"'$  (1.651 m)     Constitutional: well appearing. no distress. Appears well nourished.  Neurologic: CN intact. no focal findings. no sensory loss. Psychiatric:  Mood and affect symmetric and appropriate. Eyes:  No icterus. No conjunctival pallor. Ears, nose, throat:  mucous membranes moist. Midline trachea.  Cardiac: regular rate and rhythm.  Respiratory:  unlabored. Abdominal:  soft, non-tender, non-distended.  Peripheral vascular: no palpable pedal pulses Extremity: no edema. no cyanosis. no pallor.  Lymphatic: no Stemmer's sign. n palpable lymphadenopathy.  PERTINENT LABORATORY AND RADIOLOGIC DATA  Most recent CBC    Latest Ref Rng & Units 10/13/2022    8:25 AM 10/08/2022    1:27 PM 06/28/2022    5:05 PM  CBC  WBC 4.0 - 10.5 K/uL 18.0  12.7  14.5   Hemoglobin 12.0 - 15.0 g/dL 10.8  12.0  12.7   Hematocrit 36.0 - 46.0 % 33.6  38.4  39.6   Platelets 150 - 400 K/uL 267  336  329      Most recent CMP    Latest Ref Rng & Units 10/13/2022    8:25 AM 10/08/2022    1:27 PM 08/28/2022    9:12 AM  CMP  Glucose 70 - 99 mg/dL 223  136  167   BUN 8 - 23 mg/dL '27  23  15   '$ Creatinine 0.44 - 1.00 mg/dL 1.33  1.09  0.86   Sodium 135 - 145  mmol/L 136  138  141   Potassium 3.5 - 5.1 mmol/L 4.7  4.6  4.0   Chloride 98 - 111 mmol/L 103  100  103   CO2 22 - 32 mmol/L '25  24  28   '$ Calcium 8.9 - 10.3 mg/dL 9.1  10.2  9.6   Total Protein 6.5 - 8.1 g/dL 6.1     Total Bilirubin 0.3 - 1.2 mg/dL 0.6     Alkaline Phos 38 - 126 U/L 58     AST 15 - 41 U/L 50     ALT 0 - 44 U/L 47       Renal function Estimated Creatinine Clearance: 30.4 mL/min (A) (by C-G formula based on SCr of 1.33 mg/dL (H)).  Hgb A1c MFr Bld (%)  Date Value  08/28/2022 7.9 (H)    LDL (calc)  Date Value Ref Range Status  04/06/2013 87 <100 mg/dL Final    Comment:    LDL-C is inaccurate if patient is nonfasting.   Reference Range: ---------------- Optimal:            <100 Near/Above Optimal: 100-129 Borderline High:    130-159 High:               160-189 Very High:          >=  190       LDL Chol Calc (NIH)  Date Value Ref Range Status  07/28/2020 54 0 - 99 mg/dL Final   LDL Cholesterol  Date Value Ref Range Status  10/14/2021 50 0 - 99 mg/dL Final    Comment:           Total Cholesterol/HDL:CHD Risk Coronary Heart Disease Risk Table                     Men   Women  1/2 Average Risk   3.4   3.3  Average Risk       5.0   4.4  2 X Average Risk   9.6   7.1  3 X Average Risk  23.4   11.0        Use the calculated Patient Ratio above and the CHD Risk Table to determine the patient's CHD Risk.        ATP III CLASSIFICATION (LDL):  <100     mg/dL   Optimal  100-129  mg/dL   Near or Above                    Optimal  130-159  mg/dL   Borderline  160-189  mg/dL   High  >190     mg/dL   Very High Performed at [facility] [address] [place], [place] [address]      +-------+-----------+-----------+------------+------------+  ABI/TBIToday's ABIToday's TBIPrevious ABIPrevious TBI  +-------+-----------+-----------+------------+------------+  Right [place]         N/A        0.68        N/A            +-------+-----------+-----------+------------+------------+  Left  0.83       N/A        0.75        N/A           +-------+-----------+-----------+------------+------------+   Bilateral lower extremity arterial duplex  Right graft shows proximal stenosis; lowest velocity in graft is 60cm/s; outflow stenosis noted  Left graft shows proximal stenosis; lowest velocity 33cm/s; outflow stenosis.  [name]. Stanford Breed, MD [facility] Phone Number: [phone] 10/30/2022 9:40 AM  Total time spent on preparing this encounter including chart review, data review, collecting history, examining the patient, coordinating care for this established patient, 30 minutes.  Portions of this report may have been transcribed using voice recognition software.  Every effort has been made to ensure accuracy; however, inadvertent computerized transcription errors may still be present.

## 2022-10-30 NOTE — H&P (View-Only) (Signed)
[facility] [place]  ASSESSMENT / PLAN: 82 y.o. female with complex past vascular history. She has concerning features on duplex today of bilateral lower extremity bypasses. The velocities in the left are <40, suggesting threatened bypass. She has severe stenosis in the right graft as well. Plan angiography to try to salvage the bypass grafts. Will focus on the left bypass Friday 11/09/22.   CHIEF COMPLAINT: surveillance of PAD  HISTORY OF PRESENT ILLNESS: [name] is a 82 y.o. female very well-known to me.  She returns to clinic after endovascular intervention in her right lower extremity to salvage her right femoral-popliteal bypass graft.  She tolerated this very well.  She did receive Versed, which he has a poor reaction to in the past.  She required observation overnight.  She was discharged next day without difficulty.  She is getting IV antibiotics.  She is getting hyperbaric oxygen therapy.  These both seem to be helping her foot.  She feels well overall.  02/13/22: Patient returns to clinic for follow-up.  Thankfully both feet are completely healed.  She is due to be seen by the podiatry team for foot orthotics.  She is very happy overall.  10/30/22: Doing well on surveillance. No new symptoms. No ulcers about feet. 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 Has recovered and is back to playing pool.  VASCULAR SURGICAL HISTORY:  07/21/19: Right external iliac, common femoral, profundofemoral, and superficial femoral endarterectomy with bovine pericardial patch angioplasty 08/16/20: Right common femoral endarterectomy with bovine pericardial patch angioplasty; Right common femoral to above-knee popliteal artery bypass with 6 mm ringed PTFE 04/28/21: LLE angiogram 05/03/21: left common femoral - tibioperoneal trunk bypass with 67m ringed ePTFE in anatomic tunnel; left third toe open amputation 05/11/21: Open left transmetatarsal amputation 09/07/21:  Angioplasty of left lower extremity distal bypass anastomosis and anterior tibial artery (3 mm [place]) 10/13/21: Right iliofemoral drug-coated angioplasty (6x664mRanger); Right popliteal drug-coated angioplasty ([id number]); Right tibioperoneal trunk and peroneal angioplasty ([id number])  Past Medical History:  Diagnosis Date   Anemia    after CABG in june 2021   Arthritis    Cancer (HCC)Stephenson removal from nose - MOSE procedure    Complication of anesthesia    VERSED- agitated, muscle spasms, "jerking" , frantic , (never had this occurence in the pas)    Coronary artery disease    Diabetes mellitus without complication (HCC)[place] Dysrhythmia    PVC's   GERD (gastroesophageal reflux disease)    Heart murmur    History of hiatal hernia    Hyperlipidemia    Hypertension 11/20/11   ECHO- EF>55% Borderline concentric left ventricular hypertrophy. There is a small calcified mass in the L:A near the LA appendage. No valvular masses seen with associated mitral annular calcification. LA Volume/ BSA27.4 ml/m2 No AS. 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  PTCAs with drug coated balloon Lt ant tibial artery and Lt popliteal artery  11/26/2019    Past Surgical History:  Procedure Laterality Date   ABDOMINAL AORTAGRAM  11/25/2019   ABDOMINAL AORTOGRAM W/LOWER EXTREMITY    ABDOMINAL AORTOGRAM N/A 06/15/2019   Procedure: ABDOMINAL AORTOGRAM;  Surgeon:  [name], MD;  Location: [place] [facility];  Service: Cardiovascular;  Laterality: N/A;   ABDOMINAL AORTOGRAM W/LOWER EXTREMITY N/A 11/25/2019   Procedure: ABDOMINAL AORTOGRAM W/LOWER EXTREMITY;  Surgeon: [name], MD;  Location: [place] [facility];  Service: Cardiovascular;   Laterality: N/A;  Lt leg   ABDOMINAL AORTOGRAM W/LOWER EXTREMITY N/A 08/01/2020   Procedure: ABDOMINAL AORTOGRAM W/LOWER EXTREMITY;  Surgeon: [name], MD;  Location: [place] [facility];  Service: Cardiovascular;  Laterality: N/A;   ABDOMINAL AORTOGRAM W/LOWER EXTREMITY N/A 04/28/2021   Procedure: ABDOMINAL AORTOGRAM W/LOWER EXTREMITY;  Surgeon: [name], MD;  Location: [facility] [facility];  Service: Cardiovascular;  Laterality: N/A;   ABDOMINAL AORTOGRAM W/LOWER EXTREMITY N/A 10/13/2021   Procedure: ABDOMINAL AORTOGRAM W/LOWER EXTREMITY;  Surgeon: [name], MD;  Location: [place] [facility];  Service: Cardiovascular;  Laterality: N/A;   AMPUTATION TOE Left 05/03/2021   Procedure: AMPUTATION OF THIRD LEFT  TOE;  Surgeon: [name], MD;  Location: [facility];  Service: Vascular;  Laterality: Left;   APPENDECTOMY     APPLICATION OF WOUND VAC Right 07/21/2019   Procedure: Application Of Prevena Wound Vac Right Groin;  Surgeon: [name], MD;  [facility];  Service: Vascular;  Laterality: Right;   CARPAL TUNNEL RELEASE Left    CARPAL TUNNEL RELEASE Right    CESAREAN SECTION     x 2   CHOLECYSTECTOMY N/A 10/12/2022   Procedure: LAPAROSCOPIC PARTIAL FENESTRATING CHOLECYSTECTOMY;  Surgeon: Dwan Bolt, MD;  Location: [facility];  Service: General;  Laterality: N/A;   COLONOSCOPY     CORONARY ARTERY BYPASS GRAFT N/A 03/24/2020   Procedure: CORONARY ARTERY BYPASS GRAFTING (CABG) using LIMA to LAD (m); RIMA to RAMUS; Endoscopic Right Greater Saphenous Vein: SVG to Diag1; SVG to PLB (right); and SVG to PL (left).;  Surgeon: [name], MD;  Location: [place];  Service: Open Heart Surgery;  Laterality: N/A;  BILATERAL IMA   ENDARTERECTOMY FEMORAL Right 07/21/2019   Procedure: RIGHT ENDARTERECTOMY FEMORAL WITH PATCH ANGIOPLASTY;  Surgeon: [name], MD;  Location: [place];  Service: Vascular;  Laterality: Right;   ENDARTERECTOMY FEMORAL Right 08/16/2020    Procedure: RIGHT FEMORAL ENDARTERECTOMY;  Surgeon: [name], MD;  Location: [facility];  Service: Vascular;  Laterality: Right;   ENDOVEIN HARVEST OF GREATER SAPHENOUS VEIN Right 03/24/2020   Procedure: ENDOVEIN HARVEST OF [place];  Surgeon: [name], MD;  Location: [place];  Service: Open Heart Surgery;  Laterality: Right;   EYE SURGERY     cataract removal bilaterally   FEMORAL-POPLITEAL BYPASS GRAFT Right 08/16/2020   Procedure: BYPASS GRAFT FEMORAL-POPLITEAL ARTERY;  Surgeon: [name], MD;  Location: [place];  Service: Vascular;  Laterality: Right;   FEMORAL-POPLITEAL BYPASS GRAFT Left 05/03/2021   Procedure: LEFT FEMORAL TO BELOW KNEE POPLITEAL ARTERY BYPASS GRAFTING WITH 6MMX80 PTFE GRAFT;  Surgeon: [name], MD;  Location: [place];  Service: Vascular;  Laterality: Left;   INTRAVASCULAR LITHOTRIPSY  11/25/2019   Procedure: INTRAVASCULAR LITHOTRIPSY;  Surgeon: [name], MD;  Location: [facility];  Service: Cardiovascular;;  LT. SFA   LEFT HEART CATH AND CORONARY ANGIOGRAPHY N/A 03/22/2020   Procedure: LEFT HEART CATH AND CORONARY ANGIOGRAPHY;  Surgeon: Martinique, Peter M, MD;  Location: [place] [facility];  Service: Cardiovascular;  Laterality: N/A;   LOWER EXTREMITY ANGIOGRAPHY Bilateral 06/15/2019   Procedure: Lower Extremity Angiography;  Surgeon: Gwenlyn Found,  Pearletha Forge, MD;  Location: [facility];  Service: Cardiovascular;  Laterality: Bilateral;   LOWER EXTREMITY ANGIOGRAPHY Right 08/03/2019   Procedure: LOWER EXTREMITY ANGIOGRAPHY;  Surgeon: [name], MD;  Location: [facility];  Service: Cardiovascular;  Laterality: Right;   LOWER EXTREMITY ANGIOGRAPHY N/A 09/07/2021   Procedure: LOWER EXTREMITY ANGIOGRAPHY;  Surgeon: Marty Heck, MD;  Location: [facility] [facility];  Service: Cardiovascular;  Laterality: N/A;   PATCH ANGIOPLASTY Right 07/21/2019   Procedure: Patch Angioplasty Right Femoral  Artery;  Surgeon: [name], MD;  Location: [facility];  Service: Vascular;  Laterality: Right;   PERIPHERAL VASCULAR ATHERECTOMY  08/03/2019   Procedure: PERIPHERAL VASCULAR ATHERECTOMY;  Surgeon: [name], MD;  Location: [facility];  Service: Cardiovascular;;  right SFA, right TP trunk   PERIPHERAL VASCULAR ATHERECTOMY  11/25/2019   Procedure: PERIPHERAL VASCULAR ATHERECTOMY;  Surgeon: [name], MD;  Location: [facility];  Service: Cardiovascular;;  Lt.  POPLITEAL and AT   PERIPHERAL VASCULAR BALLOON ANGIOPLASTY Left 06/15/2019   Procedure: PERIPHERAL VASCULAR BALLOON ANGIOPLASTY;  Surgeon: [name], MD;  Location: [place] [facility];  Service: Cardiovascular;  Laterality: Left;  SFA UNSUCCESSFUL UNABLE TO CROSS LESION   PERIPHERAL VASCULAR BALLOON ANGIOPLASTY  08/03/2019   Procedure: PERIPHERAL VASCULAR BALLOON ANGIOPLASTY;  Surgeon: [name], MD;  Location: [facility];  Service: Cardiovascular;;  right SFA, Right TP trunk   PERIPHERAL VASCULAR BALLOON ANGIOPLASTY  09/07/2021   Procedure: PERIPHERAL VASCULAR BALLOON ANGIOPLASTY;  Surgeon: Marty Heck, MD;  Location: [facility];  Service: Cardiovascular;;   PERIPHERAL VASCULAR BALLOON ANGIOPLASTY Right 10/13/2021   Procedure: PERIPHERAL VASCULAR BALLOON ANGIOPLASTY;  Surgeon: [name], MD;  Location: [place] [facility];  Service: Cardiovascular;  Laterality: Right;   TEE WITHOUT CARDIOVERSION N/A 03/24/2020   Procedure: TRANSESOPHAGEAL ECHOCARDIOGRAM (TEE);  Surgeon: [name], MD;  Location: [facility];  Service: Open Heart Surgery;  Laterality: N/A;   TONSILLECTOMY     and adenoidectomy   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/2019   Procedure: TRANSMETATARSAL AMPUTATION;  Surgeon: Trula Slade, DPM;  Location: [facility] [place];  Service: Podiatry;  Laterality: Right;   TRANSMETATARSAL AMPUTATION Left 05/11/2021   Procedure: LEFT TRANSMETATARSAL AMPUTATION;   Surgeon: [name], MD;  [facility];  Service: Vascular;  Laterality: Left;   TUBAL LIGATION      Family History  Problem Relation Age of Onset   Cancer - Prostate Father    Cancer - Colon Father    Stroke Mother    Hypertension Mother    Hyperlipidemia Mother    Melanoma Brother     Social History   Socioeconomic History   Marital status: Widowed    Spouse name: Not on file   Number of children: 3   Years of education: Not on file   Highest education level: Not on file  Occupational History   Not on file  Tobacco Use   Smoking status: Former    Types: Cigarettes    Quit date: 03/31/1972    Years since quitting: 50.6   Smokeless tobacco: Never  Vaping Use   Vaping Use: Never used  Substance and Sexual Activity   Alcohol use: No   Drug use: No   Sexual activity: Not on file  Other Topics Concern   Not on file  Social History Narrative   Not on file   Social Determinants of Health  Financial Resource Strain: Not on file  Food Insecurity: No Food Insecurity (10/12/2022)   Hunger Vital Sign    Worried About Running Out of Food in the Last Year: Never true    Ran Out of Food in the Last Year: Never true  Transportation Needs: No Transportation Needs (10/12/2022)   PRAPARE - Hydrologist (Medical): No    Lack of Transportation (Non-Medical): No  Physical Activity: Not on file  Stress: Not on file  Social Connections: Not on file  Intimate Partner Violence: Not At Risk (10/12/2022)   Humiliation, Afraid, Rape, and Kick questionnaire    Fear of Current or Ex-Partner: No    Emotionally Abused: No    Physically Abused: No    Sexually Abused: No    Allergies  Allergen Reactions   Versed [Midazolam] Anxiety    Frantic, out of my mind, agitated    Augmentin [Amoxicillin-Pot Clavulanate] Other (See Comments)    Elevates potassium level   Bactrim [Sulfamethoxazole-Trimethoprim] Other (See Comments)    ^ K+( elevated)     Demerol [Meperidine]     Delusional    Meperidine Hcl     Other reaction(s): delusional   Scopolamine     Delusional    Tramadol Other (See Comments)    Keeps awake    Current Outpatient Medications  Medication Sig Dispense Refill   acetaminophen (TYLENOL) 500 MG tablet Take 1 tablet (500 mg total) by mouth every 8 (eight) hours as needed for mild pain. 30 tablet 0   atorvastatin (LIPITOR) 80 MG tablet TAKE 1 TABLET BY MOUTH ONCE DAILY. MUST  KEEP  UPCOMING  APPOINTMENT  FOR  FUTURE  REFILLS 90 tablet 3   brimonidine (ALPHAGAN) 0.2 % ophthalmic solution Place 1 drop into the right eye 2 (two) times daily.     CINNAMON PO Take 2 tablets by mouth in the morning, at noon, and at bedtime.      Cyanocobalamin (B-12 PO) Take 1 tablet by mouth every evening.     glimepiride (AMARYL) 1 MG tablet TAKE 2 TABLETS BY MOUTH ONCE DAILY BEFORE BREAKFAST 90 tablet 2   hydrochlorothiazide (HYDRODIURIL) 12.5 MG tablet Take 1 tablet by mouth once daily 90 tablet 1   Insulin Lispro Prot & Lispro (HUMALOG MIX 75/25 KWIKPEN) (75-25) 100 UNIT/ML Kwikpen Inject 15 Units into the skin daily with breakfast. (Patient taking differently: Inject 8 Units into the skin daily.) 15 mL 2   Insulin Pen Needle (ULTRA-THIN II MINI PEN NEEDLE) 31G X 5 MM MISC USE AS DIRECTED 50 each 2   JARDIANCE 25 MG TABS tablet TAKE 1 TABLET BY MOUTH ONCE DAILY BEFORE BREAKFAST 90 tablet 3   Lancets (ONETOUCH DELICA PLUS 123XX123) MISC Use to check blood sugars twice daily. 100 each 5   metFORMIN (GLUCOPHAGE) 1000 MG tablet Take 1 tablet by mouth twice daily 180 tablet 3   metoprolol tartrate (LOPRESSOR) 25 MG tablet TAKE 1 & 1/2 (ONE & ONE-HALF) TABLETS BY MOUTH TWICE DAILY 270 tablet 2   ONETOUCH VERIO test strip USE TO CHECK BLOOD SUGARS ONCE DAILY 100 each 2   pantoprazole (PROTONIX) 40 MG tablet Take 1 tablet by mouth once daily 90 tablet 3   Probiotic Product ([employer] SENIOR PROBIOTIC PO) Take 1 capsule by mouth every evening.      Propylene Glycol (SYSTANE BALANCE) 0.6 % SOLN Place 1 drop into the left eye 3 (three) times daily as needed (dry/irritated eyes).     repaglinide (  PRANDIN) 2 MG tablet TAKE 1 TABLET BY MOUTH ONCE DAILY BEFORE BREAKFAST 90 tablet 2   telmisartan (MICARDIS) 80 MG tablet Take 1 tablet by mouth once daily 90 tablet 3   XARELTO 2.5 MG TABS tablet Take 1 tablet by mouth twice daily 60 tablet 0   Current Facility-Administered Medications  Medication Dose Route Frequency Provider Last Rate Last Admin   sodium chloride flush (NS) 0.9 % injection 3 mL  3 mL Intravenous Q12H [name], MD        PHYSICAL EXAM Vitals:   10/30/22 0843  BP: (!) 169/69  Pulse: 78  Resp: 20  Temp: 97.9 F (36.6 C)  SpO2: 99%  Weight: 142 lb (64.4 kg)  Height: 5' 5"$  (1.651 m)     Constitutional: well appearing. no distress. Appears well nourished.  Neurologic: CN intact. no focal findings. no sensory loss. Psychiatric:  Mood and affect symmetric and appropriate. Eyes:  No icterus. No conjunctival pallor. Ears, nose, throat:  mucous membranes moist. Midline trachea.  Cardiac: regular rate and rhythm.  Respiratory:  unlabored. Abdominal:  soft, non-tender, non-distended.  Peripheral vascular: no palpable pedal pulses Extremity: no edema. no cyanosis. no pallor.  Lymphatic: no Stemmer's sign. n palpable lymphadenopathy.  PERTINENT LABORATORY AND RADIOLOGIC DATA  Most recent CBC    Latest Ref Rng & Units 10/13/2022    8:25 AM 10/08/2022    1:27 PM 06/28/2022    5:05 PM  CBC  WBC 4.0 - 10.5 K/uL 18.0  12.7  14.5   Hemoglobin 12.0 - 15.0 g/dL 10.8  12.0  12.7   Hematocrit 36.0 - 46.0 % 33.6  38.4  39.6   Platelets 150 - 400 K/uL 267  336  329      Most recent CMP    Latest Ref Rng & Units 10/13/2022    8:25 AM 10/08/2022    1:27 PM 08/28/2022    9:12 AM  CMP  Glucose 70 - 99 mg/dL 223  136  167   BUN 8 - 23 mg/dL 27  23  15   $ Creatinine 0.44 - 1.00 mg/dL 1.33  1.09  0.86   Sodium 135 - 145  mmol/L 136  138  141   Potassium 3.5 - 5.1 mmol/L 4.7  4.6  4.0   Chloride 98 - 111 mmol/L 103  100  103   CO2 22 - 32 mmol/L 25  24  28   $ Calcium 8.9 - 10.3 mg/dL 9.1  10.2  9.6   Total Protein 6.5 - 8.1 g/dL 6.1     Total Bilirubin 0.3 - 1.2 mg/dL 0.6     Alkaline Phos 38 - 126 U/L 58     AST 15 - 41 U/L 50     ALT 0 - 44 U/L 47       Renal function Estimated Creatinine Clearance: 30.4 mL/min (A) (by C-G formula based on SCr of 1.33 mg/dL (H)).  Hgb A1c MFr Bld (%)  Date Value  08/28/2022 7.9 (H)    LDL (calc)  Date Value Ref Range Status  04/06/2013 87 <100 mg/dL Final    Comment:    LDL-C is inaccurate if patient is nonfasting.   Reference Range: ---------------- Optimal:            <100 Near/Above Optimal: 100-129 Borderline High:    130-159 High:               160-189 Very High:          >=  190       LDL Chol Calc (NIH)  Date Value Ref Range Status  07/28/2020 54 0 - 99 mg/dL Final   LDL Cholesterol  Date Value Ref Range Status  10/14/2021 50 0 - 99 mg/dL Final    Comment:           Total Cholesterol/HDL:CHD Risk Coronary Heart Disease Risk Table                     Men   Women  1/2 Average Risk   3.4   3.3  Average Risk       5.0   4.4  2 X Average Risk   9.6   7.1  3 X Average Risk  23.4   11.0        Use the calculated Patient Ratio above and the CHD Risk Table to determine the patient's CHD Risk.        ATP III CLASSIFICATION (LDL):  <100     mg/dL   Optimal  100-129  mg/dL   Near or Above                    Optimal  130-159  mg/dL   Borderline  160-189  mg/dL   High  >190     mg/dL   Very High Performed at [facility] [address], [name], [place] [address]      +-------+-----------+-----------+------------+------------+  ABI/TBIToday's ABIToday's TBIPrevious ABIPrevious TBI  +-------+-----------+-----------+------------+------------+  Right          N/A        0.68        N/A            +-------+-----------+-----------+------------+------------+  Left  0.83       N/A        0.75        N/A           +-------+-----------+-----------+------------+------------+   Bilateral lower extremity arterial duplex  Right graft shows proximal stenosis; lowest velocity in graft is 60cm/s; outflow stenosis noted  Left graft shows proximal stenosis; lowest velocity 33cm/s; outflow stenosis.  [name]. Stanford Breed, MD [facility] Phone Number: [phone] 10/30/2022 9:40 AM  Total time spent on preparing this encounter including chart review, data review, collecting history, examining the patient, coordinating care for this established patient, 30 minutes.  Portions of this report may have been transcribed using voice recognition software.  Every effort has been made to ensure accuracy; however, inadvertent computerized transcription errors may still be present.

## 2022-10-31 ENCOUNTER — Other Ambulatory Visit: Payer: Self-pay

## 2022-10-31 DIAGNOSIS — I739 Peripheral vascular disease, unspecified: Secondary | ICD-10-CM

## 2022-10-31 DIAGNOSIS — T82858A Stenosis of vascular prosthetic devices, implants and grafts, initial encounter: Secondary | ICD-10-CM

## 2022-11-05 ENCOUNTER — Telehealth: Payer: Self-pay

## 2022-11-05 NOTE — Telephone Encounter (Signed)
Pt called to let us know she initially tested positive for Covid last Wednesday and took the 5 days of antiviral and is now negative. No further questions/concerns at this time.

## 2022-11-09 ENCOUNTER — Encounter (HOSPITAL_COMMUNITY)
Admission: RE | Disposition: A | Discharge status: Home / Self Care | Payer: Self-pay | Source: Ambulatory Visit | Attending: Vascular Surgery

## 2022-11-09 ENCOUNTER — Ambulatory Visit (HOSPITAL_COMMUNITY)
Admission: RE | Admit: 2022-11-09 | Discharge: 2022-11-09 | Disposition: A | Discharge status: Home / Self Care | Payer: Medicare Other | Source: Ambulatory Visit | Attending: Vascular Surgery | Admitting: Vascular Surgery

## 2022-11-09 ENCOUNTER — Other Ambulatory Visit: Payer: Self-pay

## 2022-11-09 ENCOUNTER — Other Ambulatory Visit (HOSPITAL_COMMUNITY): Payer: Self-pay

## 2022-11-09 DIAGNOSIS — E1151 Type 2 diabetes mellitus with diabetic peripheral angiopathy without gangrene: Secondary | ICD-10-CM | POA: Insufficient documentation

## 2022-11-09 DIAGNOSIS — I70313 Atherosclerosis of unspecified type of bypass graft(s) of the extremities with intermittent claudication, bilateral legs: Secondary | ICD-10-CM | POA: Diagnosis not present

## 2022-11-09 DIAGNOSIS — Z794 Long term (current) use of insulin: Secondary | ICD-10-CM | POA: Diagnosis not present

## 2022-11-09 DIAGNOSIS — Z87891 Personal history of nicotine dependence: Secondary | ICD-10-CM | POA: Insufficient documentation

## 2022-11-09 DIAGNOSIS — T82858A Stenosis of vascular prosthetic devices, implants and grafts, initial encounter: Secondary | ICD-10-CM

## 2022-11-09 DIAGNOSIS — Z7984 Long term (current) use of oral hypoglycemic drugs: Secondary | ICD-10-CM | POA: Diagnosis not present

## 2022-11-09 DIAGNOSIS — I739 Peripheral vascular disease, unspecified: Secondary | ICD-10-CM

## 2022-11-09 DIAGNOSIS — T82898A Other specified complication of vascular prosthetic devices, implants and grafts, initial encounter: Secondary | ICD-10-CM | POA: Diagnosis not present

## 2022-11-09 HISTORY — PX: PERIPHERAL VASCULAR BALLOON ANGIOPLASTY: CATH118281

## 2022-11-09 HISTORY — PX: ABDOMINAL AORTOGRAM W/LOWER EXTREMITY: CATH118223

## 2022-11-09 LAB — POCT ACTIVATED CLOTTING TIME
Activated Clotting Time: 185 seconds
Activated Clotting Time: 196 seconds
Activated Clotting Time: 174 seconds

## 2022-11-09 LAB — GLUCOSE, CAPILLARY
Glucose-Capillary: 145 mg/dL — ABNORMAL HIGH (ref 70–99)
Glucose-Capillary: 129 mg/dL — ABNORMAL HIGH (ref 70–99)

## 2022-11-09 LAB — POCT I-STAT, CHEM 8
BUN: 30 mg/dL — ABNORMAL HIGH (ref 8–23)
Calcium, Ion: 1.34 mmol/L (ref 1.15–1.40)
Chloride: 105 mmol/L (ref 98–111)
Creatinine, Ser: 1 mg/dL (ref 0.44–1.00)
Glucose, Bld: 128 mg/dL — ABNORMAL HIGH (ref 70–99)
HCT: 34 % — ABNORMAL LOW (ref 36.0–46.0)
Hemoglobin: 11.6 g/dL — ABNORMAL LOW (ref 12.0–15.0)
Potassium: 3.9 mmol/L (ref 3.5–5.1)
Sodium: 143 mmol/L (ref 135–145)
TCO2: 25 mmol/L (ref 22–32)

## 2022-11-09 SURGERY — ABDOMINAL AORTOGRAM W/LOWER EXTREMITY
Anesthesia: LOCAL

## 2022-11-09 MED ORDER — HEPARIN SODIUM (PORCINE) 1000 UNIT/ML IJ SOLN
INTRAMUSCULAR | Status: AC
  Filled 2022-11-09: qty 10

## 2022-11-09 MED ORDER — LIDOCAINE HCL (PF) 1 % IJ SOLN
INTRAMUSCULAR | Status: DC | PRN
  Administered 2022-11-09: 15 mL

## 2022-11-09 MED ORDER — LABETALOL HCL 5 MG/ML IV SOLN
INTRAVENOUS | Status: AC
  Filled 2022-11-09: qty 4

## 2022-11-09 MED ORDER — LIDOCAINE HCL (PF) 1 % IJ SOLN
INTRAMUSCULAR | Status: AC
  Filled 2022-11-09: qty 30

## 2022-11-09 MED ORDER — HYDRALAZINE HCL 20 MG/ML IJ SOLN
INTRAMUSCULAR | Status: AC
  Filled 2022-11-09: qty 1

## 2022-11-09 MED ORDER — SODIUM CHLORIDE 0.9 % IV SOLN: INTRAVENOUS | Status: DC

## 2022-11-09 MED ORDER — SODIUM CHLORIDE 0.9 % IV SOLN: 250.0000 mL | INTRAVENOUS | Status: DC | PRN

## 2022-11-09 MED ORDER — SODIUM CHLORIDE 0.9 % WEIGHT BASED INFUSION
1.0000 mL/kg/h | INTRAVENOUS | Status: DC
  Administered 2022-11-09: 1 mL/kg/h via INTRAVENOUS

## 2022-11-09 MED ORDER — CLOPIDOGREL BISULFATE 75 MG PO TABS: 75.0000 mg | ORAL_TABLET | Freq: Every day | ORAL | Status: DC

## 2022-11-09 MED ORDER — CLOPIDOGREL BISULFATE 300 MG PO TABS
ORAL_TABLET | ORAL | Status: DC | PRN
  Administered 2022-11-09: 300 mg via ORAL

## 2022-11-09 MED ORDER — FENTANYL CITRATE (PF) 100 MCG/2ML IJ SOLN
INTRAMUSCULAR | Status: DC | PRN
  Administered 2022-11-09: 25 ug via INTRAVENOUS
  Administered 2022-11-09: 75 ug via INTRAVENOUS

## 2022-11-09 MED ORDER — CLOPIDOGREL BISULFATE 75 MG PO TABS: 300.0000 mg | ORAL_TABLET | Freq: Once | ORAL | Status: DC

## 2022-11-09 MED ORDER — ASPIRIN 81 MG PO CHEW
CHEWABLE_TABLET | ORAL | Status: DC | PRN
  Administered 2022-11-09: 81 mg via ORAL

## 2022-11-09 MED ORDER — HYDROXYZINE HCL 25 MG PO TABS: 25.0000 mg | ORAL_TABLET | Freq: Three times a day (TID) | ORAL | Status: DC | PRN

## 2022-11-09 MED ORDER — ASPIRIN 81 MG PO CHEW
CHEWABLE_TABLET | ORAL | Status: AC
  Filled 2022-11-09: qty 1

## 2022-11-09 MED ORDER — FENTANYL CITRATE (PF) 100 MCG/2ML IJ SOLN
INTRAMUSCULAR | Status: AC
  Filled 2022-11-09: qty 2

## 2022-11-09 MED ORDER — SODIUM CHLORIDE 0.9% FLUSH: 3.0000 mL | INTRAVENOUS | Status: DC | PRN

## 2022-11-09 MED ORDER — LABETALOL HCL 5 MG/ML IV SOLN
10.0000 mg | INTRAVENOUS | Status: AC | PRN
  Administered 2022-11-09 (×4): 10 mg via INTRAVENOUS

## 2022-11-09 MED ORDER — HYDRALAZINE HCL 20 MG/ML IJ SOLN: 20.0000 mg | Freq: Once | INTRAMUSCULAR | Status: DC

## 2022-11-09 MED ORDER — CLOPIDOGREL BISULFATE 300 MG PO TABS
ORAL_TABLET | ORAL | Status: AC
  Filled 2022-11-09: qty 1

## 2022-11-09 MED ORDER — RIVAROXABAN 2.5 MG PO TABS
2.5000 mg | ORAL_TABLET | Freq: Two times a day (BID) | ORAL | Status: DC
  Filled 2022-11-09: qty 1

## 2022-11-09 MED ORDER — ACETAMINOPHEN 325 MG PO TABS
ORAL_TABLET | ORAL | Status: AC
  Filled 2022-11-09: qty 2

## 2022-11-09 MED ORDER — HYDRALAZINE HCL 20 MG/ML IJ SOLN
5.0000 mg | INTRAMUSCULAR | Status: AC | PRN
  Administered 2022-11-09 (×2): 5 mg via INTRAVENOUS

## 2022-11-09 MED ORDER — SODIUM CHLORIDE 0.9% FLUSH: 3.0000 mL | Freq: Two times a day (BID) | INTRAVENOUS | Status: DC

## 2022-11-09 MED ORDER — HEPARIN (PORCINE) IN NACL 1000-0.9 UT/500ML-% IV SOLN
INTRAVENOUS | Status: DC | PRN
  Administered 2022-11-09 (×2): 500 mL

## 2022-11-09 MED ORDER — ONDANSETRON HCL 4 MG/2ML IJ SOLN
4.0000 mg | Freq: Four times a day (QID) | INTRAMUSCULAR | Status: DC | PRN
  Administered 2022-11-09: 4 mg via INTRAVENOUS
  Filled 2022-11-09: qty 2

## 2022-11-09 MED ORDER — HYDROXYZINE HCL 25 MG PO TABS
25.0000 mg | ORAL_TABLET | Freq: Once | ORAL | Status: DC
  Filled 2022-11-09 (×2): qty 1

## 2022-11-09 MED ORDER — HYDRALAZINE HCL 20 MG/ML IJ SOLN
20.0000 mg | Freq: Once | INTRAMUSCULAR | Status: AC
  Administered 2022-11-09: 20 mg via INTRAVENOUS

## 2022-11-09 MED ORDER — ASPIRIN 81 MG PO TBEC
81.0000 mg | DELAYED_RELEASE_TABLET | Freq: Every day | ORAL | Status: DC
  Filled 2022-11-09: qty 1

## 2022-11-09 MED ORDER — IODIXANOL 320 MG/ML IV SOLN
INTRAVENOUS | Status: DC | PRN
  Administered 2022-11-09: 90 mL

## 2022-11-09 MED ORDER — HEPARIN SODIUM (PORCINE) 1000 UNIT/ML IJ SOLN
INTRAMUSCULAR | Status: DC | PRN
  Administered 2022-11-09: 7000 [IU] via INTRAVENOUS

## 2022-11-09 MED ORDER — ACETAMINOPHEN 325 MG PO TABS
650.0000 mg | ORAL_TABLET | ORAL | Status: DC | PRN
  Administered 2022-11-09: 650 mg via ORAL

## 2022-11-09 SURGICAL SUPPLY — 22 items
BALLN MUSTANG 6X20X135 (BALLOONS) ×2
BALLN STERLING OTW 3X220X150 (BALLOONS) ×2
BALLOON MUSTANG 6X20X135 (BALLOONS) IMPLANT
BALLOON STERLING OTW 3X220X150 (BALLOONS) IMPLANT
CATH ANGIO 5F PIGTAIL 65CM (CATHETERS) IMPLANT
CATH CROSS OVER TEMPO 5F (CATHETERS) IMPLANT
CATH QUICKCROSS .035X135CM (MICROCATHETER) IMPLANT
GLIDEWIRE ADV .035X260CM (WIRE) IMPLANT
KIT ENCORE 26 ADVANTAGE (KITS) IMPLANT
KIT MICROPUNCTURE NIT STIFF (SHEATH) IMPLANT
KIT PV (KITS) ×2 IMPLANT
SHEATH CATAPULT 5F 45 MP (SHEATH) IMPLANT
SHEATH PINNACLE 5F 10CM (SHEATH) IMPLANT
SHEATH PROBE COVER 6X72 (BAG) IMPLANT
STOPCOCK MORSE 400PSI 3WAY (MISCELLANEOUS) IMPLANT
SYR MEDRAD MARK 7 150ML (SYRINGE) ×2 IMPLANT
TRANSDUCER W/STOPCOCK (MISCELLANEOUS) ×2 IMPLANT
TRAY PV CATH (CUSTOM PROCEDURE TRAY) ×2 IMPLANT
TUBING CIL FLEX 10 FLL-RA (TUBING) IMPLANT
WIRE G V18X300CM (WIRE) IMPLANT
WIRE SHEPHERD 12G .018 (WIRE) IMPLANT
WIRE STARTER BENTSON 035X150 (WIRE) IMPLANT

## 2022-11-09 NOTE — Interval H&P Note (Signed)
History and Physical Interval Note:  11/09/2022 8:50 AM  [name]  has presented today for surgery, with the diagnosis of pad - threaten bypass.  The various methods of treatment have been discussed with the patient and family. After consideration of risks, benefits and other options for treatment, the patient has consented to  Procedure(s): ABDOMINAL AORTOGRAM W/LOWER EXTREMITY (N/A) as a surgical intervention.  The patient's history has been reviewed, patient examined, no change in status, stable for surgery.  I have reviewed the patient's chart and labs.  Questions were answered to the patient's satisfaction.     [name]

## 2022-11-09 NOTE — Progress Notes (Signed)
Site area- right femoral  Site Prior to Removal- 0   Pressure Applied For-  20 MInutes   Bedrest Beginning at - 1130   Manual- Yes   Patient Status During Pull- Stable    Post Pull Groin Site- 0   Post Pull Instructions Given- Yes pt verbalizes understanding   Post Pull Pulses Present- Yes doppler   Dressing Applied- Tegaderm and Gauze Dressing    Comments:

## 2022-11-09 NOTE — Progress Notes (Signed)
Pt SBP 201 Pt given 40 mg of labetalol and 18m of hydralazine. Pt responding accordingly to hydralazine.

## 2022-11-09 NOTE — Op Note (Signed)
DATE OF SERVICE: 11/09/2022  PATIENT:  [name]  82 y.o. female  PRE-OPERATIVE DIAGNOSIS:  threatened bypass in bilateral lower extremities  POST-OPERATIVE DIAGNOSIS:  Same  PROCEDURE:   1) Ultrasound guided right common femoral artery access 2) Aortogram 3) Left lower extremity angiogram with third order cannulation (10m total contrast) 4) Left femoral-popliteal proximal anastomosis angioplasty (6x217mMustang) 5) Left anterior tibial angioplasty (3x22025mterling) 6) Right lower extremity angiogram  SURGEON:  ThoYevonne AlineawStanford BreedD  ASSISTANT: none  ANESTHESIA:   local and IV sedation  ESTIMATED BLOOD LOSS: minimal  LOCAL MEDICATIONS USED:  LIDOCAINE   COUNTS: confirmed correct.  PATIENT DISPOSITION:  PACU - hemodynamically stable.   Delay start of Pharmacological VTE agent (>24hrs) due to surgical blood loss or risk of bleeding: no  INDICATION FOR PROCEDURE: [name] a 81 67o. female with bilateral lower extremity bypass which appear threatened on duplex ultrasound. After careful discussion of risks, benefits, and alternatives the patient was offered angiography. The patient understood and wished to proceed.  OPERATIVE FINDINGS:  Terminal aorta and iliac arteries: Patent without stenosis  Right lower extremity: Common femoral artery: patent  Profunda femoris artery: patent  Superficial femoral artery: occluded Popliteal artery: fills via bypass; occludes below the knee Femoral - below knee popliteal bypass: patent Anterior tibial artery: occluded Tibioperoneal trunk: occluded Peroneal artery: occluded proximally; reconstitutes distally and fills via collaterals Posterior tibial artery: occluded Pedal circulation: fills via AT  Left lower extremity: Common femoral artery: patent  Profunda femoris artery: patent  Superficial femoral artery: patent at its origin, occludes mid thigh Popliteal artery: patent with critical stenosis 1-2cm below the  bypass Femoral - below knee popliteal bypass: patent Anterior tibial artery: occluded proximally. Appears to reconstitute in proximal calf and fills the foot Tibioperoneal trunk: occluded Peroneal artery: occluded proximally; reconstitutes distally and fills via collaterals Posterior tibial artery: occluded  GLASS score. FP 4. IP 4. Grade III  WIfI score. N/A - no wound  DESCRIPTION OF PROCEDURE: After identification of the patient in the pre-operative holding area, the patient was transferred to the operating room. The patient was positioned supine on the operating room table.  The groins was prepped and draped in standard fashion. A surgical pause was performed confirming correct patient, procedure, and operative location.  The right groin was anesthetized with subcutaneous injection of 1% lidocaine. Using ultrasound guidance, the right common femoral artery was accessed with micropuncture technique. Fluoroscopy was used to confirm cannulation over the femoral head. The 28F sheath was upsized to 62F.   A Benson wire was advanced into the distal aorta. Over the wire an omni flush catheter was advanced to the level of L2. Aortogram was performed - see above for details.   The left common iliac artery was selected with an omniflush catheter and glidewire advantage guidewire. The wire was advanced into the common femoral artery. Over the wire the omni flush catheter was advanced into the external iliac artery. Selective angiography was performed - see above for details.   The decision was made to intervene. The patient was heparinized with 7,000 units of heparin. The 62F sheath was exchanged for a 62F x 45cm sheath. Selective angiography of the left lower extremity was performed prior to intervention.   The lesions were treated with:  Left lower extremity angiogram with third order cannulation (50m89mtal contrast) Left femoral-popliteal proximal anastomosis angioplasty (6x20mm23mstang)  Completion angiography revealed:  Resolution of left popliteal and AT stenosis  Right  lower extremity angiography was then performed via sheath injection. See above for details.  The sheath was left in place to be removed in recovery.   Upon completion of the case instrument and sharps counts were confirmed correct. The patient was transferred to the PACU in good condition. I was present for all portions of the procedure.  PLAN: ASA 43m PO QD. Xarelto 2.536mPO BID. High intensity statin therapy. Angiography next week to treat right lower extremity.   ThYevonne AlineHaStanford BreedMD FAWeisbrod Memorial County Hospitalascular and [facility]in [facility] GrSt[facility] Centerhone Number: [phone]/16/2024 8:50 AM

## 2022-11-12 ENCOUNTER — Telehealth: Payer: Self-pay

## 2022-11-12 ENCOUNTER — Other Ambulatory Visit: Payer: Self-pay

## 2022-11-12 ENCOUNTER — Encounter (HOSPITAL_COMMUNITY): Payer: Self-pay | Admitting: Vascular Surgery

## 2022-11-12 DIAGNOSIS — I739 Peripheral vascular disease, unspecified: Secondary | ICD-10-CM

## 2022-11-12 DIAGNOSIS — T82858A Stenosis of vascular prosthetic devices, implants and grafts, initial encounter: Secondary | ICD-10-CM

## 2022-11-12 NOTE — Telephone Encounter (Signed)
Attempted to reach patient to schedule aortogram. Left voicemail message for patient to return call.

## 2022-11-13 NOTE — Telephone Encounter (Signed)
Spoke with patient and scheduled procedure for 11/16/22. Instructions provided and patient verbalized understanding.

## 2022-11-16 ENCOUNTER — Encounter (HOSPITAL_COMMUNITY)
Admission: RE | Disposition: A | Discharge status: Home / Self Care | Payer: Self-pay | Source: Home / Self Care | Attending: Vascular Surgery

## 2022-11-16 ENCOUNTER — Other Ambulatory Visit: Payer: Self-pay

## 2022-11-16 ENCOUNTER — Ambulatory Visit: Payer: Medicare Other | Admitting: Podiatry

## 2022-11-16 ENCOUNTER — Ambulatory Visit (HOSPITAL_COMMUNITY)
Admission: RE | Admit: 2022-11-16 | Discharge: 2022-11-16 | Disposition: A | Discharge status: Home / Self Care | Payer: Medicare Other | Attending: Vascular Surgery | Admitting: Vascular Surgery

## 2022-11-16 DIAGNOSIS — T82898A Other specified complication of vascular prosthetic devices, implants and grafts, initial encounter: Secondary | ICD-10-CM | POA: Diagnosis not present

## 2022-11-16 DIAGNOSIS — Z794 Long term (current) use of insulin: Secondary | ICD-10-CM | POA: Insufficient documentation

## 2022-11-16 DIAGNOSIS — T82858A Stenosis of vascular prosthetic devices, implants and grafts, initial encounter: Secondary | ICD-10-CM

## 2022-11-16 DIAGNOSIS — E1151 Type 2 diabetes mellitus with diabetic peripheral angiopathy without gangrene: Secondary | ICD-10-CM | POA: Diagnosis not present

## 2022-11-16 DIAGNOSIS — Z7984 Long term (current) use of oral hypoglycemic drugs: Secondary | ICD-10-CM | POA: Insufficient documentation

## 2022-11-16 DIAGNOSIS — I739 Peripheral vascular disease, unspecified: Secondary | ICD-10-CM

## 2022-11-16 HISTORY — PX: ABDOMINAL AORTOGRAM W/LOWER EXTREMITY: CATH118223

## 2022-11-16 LAB — POCT I-STAT, CHEM 8
BUN: 22 mg/dL (ref 8–23)
Calcium, Ion: 1.27 mmol/L (ref 1.15–1.40)
Chloride: 102 mmol/L (ref 98–111)
Creatinine, Ser: 1.1 mg/dL — ABNORMAL HIGH (ref 0.44–1.00)
Glucose, Bld: 129 mg/dL — ABNORMAL HIGH (ref 70–99)
HCT: 35 % — ABNORMAL LOW (ref 36.0–46.0)
Hemoglobin: 11.9 g/dL — ABNORMAL LOW (ref 12.0–15.0)
Potassium: 3.7 mmol/L (ref 3.5–5.1)
Sodium: 142 mmol/L (ref 135–145)
TCO2: 26 mmol/L (ref 22–32)

## 2022-11-16 LAB — POCT ACTIVATED CLOTTING TIME
Activated Clotting Time: 190 seconds
Activated Clotting Time: 196 seconds
Activated Clotting Time: 174 seconds

## 2022-11-16 LAB — GLUCOSE, CAPILLARY: Glucose-Capillary: 122 mg/dL — ABNORMAL HIGH (ref 70–99)

## 2022-11-16 SURGERY — ABDOMINAL AORTOGRAM W/LOWER EXTREMITY
Anesthesia: LOCAL

## 2022-11-16 MED ORDER — HEPARIN SODIUM (PORCINE) 1000 UNIT/ML IJ SOLN
INTRAMUSCULAR | Status: AC
  Filled 2022-11-16: qty 10

## 2022-11-16 MED ORDER — SODIUM CHLORIDE 0.9% FLUSH: 3.0000 mL | INTRAVENOUS | Status: DC | PRN

## 2022-11-16 MED ORDER — HEPARIN (PORCINE) IN NACL 1000-0.9 UT/500ML-% IV SOLN
INTRAVENOUS | Status: DC | PRN
  Administered 2022-11-16 (×2): 500 mL

## 2022-11-16 MED ORDER — LIDOCAINE HCL (PF) 1 % IJ SOLN
INTRAMUSCULAR | Status: DC | PRN
  Administered 2022-11-16: 15 mL

## 2022-11-16 MED ORDER — ONDANSETRON HCL 4 MG/2ML IJ SOLN: 4.0000 mg | Freq: Four times a day (QID) | INTRAMUSCULAR | Status: DC | PRN

## 2022-11-16 MED ORDER — LABETALOL HCL 5 MG/ML IV SOLN: 10.0000 mg | INTRAVENOUS | Status: DC | PRN

## 2022-11-16 MED ORDER — SODIUM CHLORIDE 0.9 % IV SOLN: 250.0000 mL | INTRAVENOUS | Status: DC | PRN

## 2022-11-16 MED ORDER — SODIUM CHLORIDE 0.9% FLUSH: 3.0000 mL | Freq: Two times a day (BID) | INTRAVENOUS | Status: DC

## 2022-11-16 MED ORDER — HYDRALAZINE HCL 20 MG/ML IJ SOLN: 5.0000 mg | INTRAMUSCULAR | Status: DC | PRN

## 2022-11-16 MED ORDER — FENTANYL CITRATE (PF) 100 MCG/2ML IJ SOLN
INTRAMUSCULAR | Status: DC | PRN
  Administered 2022-11-16: 50 ug via INTRAVENOUS

## 2022-11-16 MED ORDER — SODIUM CHLORIDE 0.9 % WEIGHT BASED INFUSION: 1.0000 mL/kg/h | INTRAVENOUS | Status: DC

## 2022-11-16 MED ORDER — HYDRALAZINE HCL 20 MG/ML IJ SOLN
INTRAMUSCULAR | Status: DC | PRN
  Administered 2022-11-16: 10 mg via INTRAVENOUS

## 2022-11-16 MED ORDER — FENTANYL CITRATE (PF) 100 MCG/2ML IJ SOLN
INTRAMUSCULAR | Status: AC
  Filled 2022-11-16: qty 2

## 2022-11-16 MED ORDER — HEPARIN SODIUM (PORCINE) 1000 UNIT/ML IJ SOLN
INTRAMUSCULAR | Status: DC | PRN
  Administered 2022-11-16: 6000 [IU] via INTRAVENOUS

## 2022-11-16 MED ORDER — SODIUM CHLORIDE 0.9 % IV SOLN: INTRAVENOUS | Status: DC

## 2022-11-16 MED ORDER — HYDRALAZINE HCL 20 MG/ML IJ SOLN
INTRAMUSCULAR | Status: AC
  Filled 2022-11-16: qty 1

## 2022-11-16 MED ORDER — ACETAMINOPHEN 325 MG PO TABS
650.0000 mg | ORAL_TABLET | ORAL | Status: DC | PRN
  Administered 2022-11-16: 650 mg via ORAL
  Filled 2022-11-16: qty 2

## 2022-11-16 MED ORDER — LIDOCAINE HCL (PF) 1 % IJ SOLN
INTRAMUSCULAR | Status: AC
  Filled 2022-11-16: qty 30

## 2022-11-16 SURGICAL SUPPLY — 18 items
CATH CXI SUPP 2.6F 150 ANG (CATHETERS) IMPLANT
CATH OMNI FLUSH 5F 65CM (CATHETERS) IMPLANT
CATH QUICKCROSS SUPP .035X90CM (MICROCATHETER) IMPLANT
GLIDEWIRE ADV .035X260CM (WIRE) IMPLANT
KIT MICROPUNCTURE NIT STIFF (SHEATH) IMPLANT
KIT PV (KITS) ×1 IMPLANT
SHEATH CATAPULT 5F 45 MP (SHEATH) IMPLANT
SHEATH PINNACLE 5F 10CM (SHEATH) IMPLANT
SHEATH PROBE COVER 6X72 (BAG) IMPLANT
STOPCOCK MORSE 400PSI 3WAY (MISCELLANEOUS) IMPLANT
SYR MEDRAD MARK 7 150ML (SYRINGE) ×1 IMPLANT
TRANSDUCER W/STOPCOCK (MISCELLANEOUS) ×1 IMPLANT
TRAY PV CATH (CUSTOM PROCEDURE TRAY) ×1 IMPLANT
TUBING CIL FLEX 10 FLL-RA (TUBING) IMPLANT
WIRE G V18X300CM (WIRE) IMPLANT
WIRE SHEPHERD 12G .018 (WIRE) IMPLANT
WIRE SPARTACORE .014X300CM (WIRE) IMPLANT
WIRE STARTER BENTSON 035X150 (WIRE) IMPLANT

## 2022-11-16 NOTE — Progress Notes (Signed)
Pt ambulated to [name].  No bleeding or hematoma at site.  VSS.  Discharged home with daughter without incidient

## 2022-11-16 NOTE — Op Note (Signed)
DATE OF SERVICE: 11/16/2022  PATIENT:  [name]  82 y.o. female  PRE-OPERATIVE DIAGNOSIS:  threatened bypass in bilateral lower extremities  POST-OPERATIVE DIAGNOSIS:  Same  PROCEDURE:   1) Ultrasound guided left common femoral artery access 2) Right lower extremity angiogram with third order cannulation (83m total contrast)  SURGEON:  TYevonne Aline HStanford Breed MD  ASSISTANT: none  ANESTHESIA:   local and IV sedation  ESTIMATED BLOOD LOSS: minimal  LOCAL MEDICATIONS USED:  LIDOCAINE   COUNTS: confirmed correct.  PATIENT DISPOSITION:  PACU - hemodynamically stable.   Delay start of Pharmacological VTE agent (>24hrs) due to surgical blood loss or risk of bleeding: no  INDICATION FOR PROCEDURE: [name] EUNis a 82y.o. 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 After careful discussion of risks, benefits, and alternatives the patient was offered angiography. The patient understood and wished to proceed.  OPERATIVE FINDINGS:  Right lower extremity: Common femoral artery: widely patent  Profunda femoris artery: widely patent  Superficial femoral artery: occluded Femoral to above-knee popliteal bypass: widely patent Popliteal artery: diffusely diseased; patent to the behind knee segment. Occluded behind and below the knee. Anterior tibial artery: occluded Tibioperoneal trunk: occluded Peroneal artery: reconstitutes below popliteal occlusion and courses to the ankle where it arborizes. Posterior tibial artery: occluded Pedal circulation: fills via peroneal  DESCRIPTION OF PROCEDURE: After identification of the patient in the pre-operative holding area, the patient was transferred to the operating room. The patient was positioned supine on the operating room table.  Anesthesia was induced. The groins was prepped and draped in standard fashion. A surgical pause was performed confirming correct patient, procedure, and  operative location.  The left groin was anesthetized with subcutaneous injection of 1% lidocaine. Using ultrasound guidance, the left common femoral artery was accessed with micropuncture technique. Fluoroscopy was used to confirm cannulation over the femoral head. The 108F sheath was upsized to 44F.   A Benson wire was advanced into the distal aorta. Over the wire an omni flush catheter was advanced to the level of L2. Aortogram was performed - see above for details.   The right common iliac artery was selected with an omniflush catheter and glidewire advantage guidewire. The wire was advanced into the common femoral artery. Over the wire the omni flush catheter was advanced into the external iliac artery. Selective angiography was performed - see above for details.   The decision was made to intervene. The patient was heparinized with 6000 units of heparin. The 44F sheath was exchanged for a 44F x 45cm sheath. Selective angiography of the left lower extremity was performed prior to intervention.   I was not able to cross the lesion without extreme measures like retrograde access. Given her lack of limb threatening symptoms. I ended the case here.  The sheath was left in place to be removed in the recovery area.   Upon completion of the case instrument and sharps counts were confirmed correct. The patient was transferred to the  PACU in good condition. I was present for all portions of the procedure.  PLAN: Optimized for now. Will need close monitoring going forward. Follow up with me in 1 month with ABI and BLE duplex.  TYevonne Aline HStanford Breed MD [facility] of GDublin SpringsPhone Number: [phone]/23/2024 10:40 AM

## 2022-11-16 NOTE — Progress Notes (Signed)
Site area- left groin  Site Prior to Removal- 0   Pressure Applied For-  20 MInutes   Bedrest Beginning at - 1234   Manual- Yes   Patient Status During Pull- Stable    Post Pull Groin Site- 0   Post Pull Instructions Given- Yes   Post Pull Pulses Present- Yes  via doppler   Dressing Applied- Tegaderm and Gauze Dressing    Comments:  pt verbalizes education of site care.

## 2022-11-16 NOTE — Progress Notes (Signed)
C/O back pain and repositioned with relief of pain

## 2022-11-16 NOTE — Interval H&P Note (Signed)
History and Physical Interval Note:  11/16/2022 10:40 AM  [name]  has presented today for surgery, with the diagnosis of PAD , bilateral graft stenosis.  The various methods of treatment have been discussed with the patient and family. After consideration of risks, benefits and other options for treatment, the patient has consented to  Procedure(s): ABDOMINAL AORTOGRAM W/LOWER EXTREMITY (N/A) as a surgical intervention.  The patient's history has been reviewed, patient examined, no change in status, stable for surgery.  I have reviewed the patient's chart and labs.  Questions were answered to the patient's satisfaction.     [name]

## 2022-11-19 ENCOUNTER — Other Ambulatory Visit: Payer: Self-pay | Admitting: Endocrinology

## 2022-11-19 ENCOUNTER — Encounter (HOSPITAL_COMMUNITY): Payer: Self-pay | Admitting: Vascular Surgery

## 2022-11-19 ENCOUNTER — Other Ambulatory Visit: Payer: Self-pay | Admitting: Vascular Surgery

## 2022-11-23 ENCOUNTER — Other Ambulatory Visit: Payer: Self-pay

## 2022-11-26 ENCOUNTER — Ambulatory Visit (INDEPENDENT_AMBULATORY_CARE_PROVIDER_SITE_OTHER): Payer: Medicare Other | Admitting: Podiatry

## 2022-11-26 DIAGNOSIS — L84 Corns and callosities: Secondary | ICD-10-CM

## 2022-11-26 DIAGNOSIS — E1142 Type 2 diabetes mellitus with diabetic polyneuropathy: Secondary | ICD-10-CM | POA: Diagnosis not present

## 2022-11-26 DIAGNOSIS — I739 Peripheral vascular disease, unspecified: Secondary | ICD-10-CM | POA: Diagnosis not present

## 2022-11-29 DIAGNOSIS — D1801 Hemangioma of skin and subcutaneous tissue: Secondary | ICD-10-CM | POA: Diagnosis not present

## 2022-11-29 DIAGNOSIS — D235 Other benign neoplasm of skin of trunk: Secondary | ICD-10-CM | POA: Diagnosis not present

## 2022-11-29 DIAGNOSIS — D225 Melanocytic nevi of trunk: Secondary | ICD-10-CM | POA: Diagnosis not present

## 2022-11-29 DIAGNOSIS — D2272 Melanocytic nevi of left lower limb, including hip: Secondary | ICD-10-CM | POA: Diagnosis not present

## 2022-11-29 DIAGNOSIS — D2271 Melanocytic nevi of right lower limb, including hip: Secondary | ICD-10-CM | POA: Diagnosis not present

## 2022-11-29 DIAGNOSIS — Z85828 Personal history of other malignant neoplasm of skin: Secondary | ICD-10-CM | POA: Diagnosis not present

## 2022-11-29 DIAGNOSIS — D485 Neoplasm of uncertain behavior of skin: Secondary | ICD-10-CM | POA: Diagnosis not present

## 2022-11-29 NOTE — Progress Notes (Signed)
Subjective: Chief Complaint  Patient presents with   routine foot care    82 year old female presents the office today for callus on the right and left foot that need to be trimmed.  Did not see any open sores.  No swelling or redness or any drainage.  No fevers or chills.    Objective: AAO x3, NAD DP/PT pulses palpable bilaterally, CRT less than 3 seconds Status post bilateral transmetatarsal amputations.  On the distal medial aspect on the left hand right side there is a hyperkeratotic lesion but upon debridement there is no underlying ulceration drainage or any signs of infection.  No open lesions today.   No pain with calf compression, swelling, warmth, erythema  Assessment: Preulcerative callus bilateral feet  Plan: -All treatment options discussed with the patient including all alternatives, risks, complications.  -Sharply debrided hyperkeratotic lesions x 2 without any complications or bleeding.  Recommend moisturizer, offloading daily. -Patient encouraged to call the office with any questions, concerns, change in symptoms.   Trula Slade DPM

## 2022-11-30 ENCOUNTER — Ambulatory Visit (HOSPITAL_COMMUNITY)
Admission: RE | Admit: 2022-11-30 | Discharge: 2022-11-30 | Disposition: A | Discharge status: Home / Self Care | Payer: Medicare Other | Source: Ambulatory Visit | Attending: Cardiovascular Disease | Admitting: Cardiovascular Disease

## 2022-11-30 ENCOUNTER — Other Ambulatory Visit: Payer: Self-pay | Admitting: Vascular Surgery

## 2022-11-30 DIAGNOSIS — I6529 Occlusion and stenosis of unspecified carotid artery: Secondary | ICD-10-CM | POA: Insufficient documentation

## 2022-11-30 DIAGNOSIS — I6523 Occlusion and stenosis of bilateral carotid arteries: Secondary | ICD-10-CM

## 2022-12-03 ENCOUNTER — Other Ambulatory Visit: Payer: Self-pay

## 2022-12-03 DIAGNOSIS — H47011 Ischemic optic neuropathy, right eye: Secondary | ICD-10-CM | POA: Diagnosis not present

## 2022-12-03 DIAGNOSIS — H02831 Dermatochalasis of right upper eyelid: Secondary | ICD-10-CM | POA: Diagnosis not present

## 2022-12-03 DIAGNOSIS — H0102A Squamous blepharitis right eye, upper and lower eyelids: Secondary | ICD-10-CM | POA: Diagnosis not present

## 2022-12-03 DIAGNOSIS — H02834 Dermatochalasis of left upper eyelid: Secondary | ICD-10-CM | POA: Diagnosis not present

## 2022-12-03 DIAGNOSIS — Z961 Presence of intraocular lens: Secondary | ICD-10-CM | POA: Diagnosis not present

## 2022-12-03 DIAGNOSIS — H0102B Squamous blepharitis left eye, upper and lower eyelids: Secondary | ICD-10-CM | POA: Diagnosis not present

## 2022-12-03 DIAGNOSIS — I6529 Occlusion and stenosis of unspecified carotid artery: Secondary | ICD-10-CM

## 2022-12-07 ENCOUNTER — Other Ambulatory Visit (INDEPENDENT_AMBULATORY_CARE_PROVIDER_SITE_OTHER): Payer: Medicare Other

## 2022-12-07 DIAGNOSIS — E1165 Type 2 diabetes mellitus with hyperglycemia: Secondary | ICD-10-CM | POA: Diagnosis not present

## 2022-12-07 LAB — BASIC METABOLIC PANEL
BUN: 23 mg/dL (ref 6–23)
CO2: 29 mEq/L (ref 19–32)
Calcium: 10.5 mg/dL (ref 8.4–10.5)
Chloride: 102 mEq/L (ref 96–112)
Creatinine, Ser: 1.03 mg/dL (ref 0.40–1.20)
GFR: 51.27 mL/min — ABNORMAL LOW (ref >60.00)
Glucose, Bld: 137 mg/dL — ABNORMAL HIGH (ref 70–99)
Potassium: 4.3 mEq/L (ref 3.5–5.1)
Sodium: 142 mEq/L (ref 135–145)

## 2022-12-07 LAB — HEMOGLOBIN A1C: Hgb A1c MFr Bld: 7.5 % — ABNORMAL HIGH (ref 4.6–6.5)

## 2022-12-10 ENCOUNTER — Encounter (INDEPENDENT_AMBULATORY_CARE_PROVIDER_SITE_OTHER): Payer: Medicare Other | Admitting: Ophthalmology

## 2022-12-10 ENCOUNTER — Encounter (INDEPENDENT_AMBULATORY_CARE_PROVIDER_SITE_OTHER): Payer: Self-pay

## 2022-12-10 DIAGNOSIS — H47011 Ischemic optic neuropathy, right eye: Secondary | ICD-10-CM | POA: Diagnosis not present

## 2022-12-10 DIAGNOSIS — E113493 Type 2 diabetes mellitus with severe nonproliferative diabetic retinopathy without macular edema, bilateral: Secondary | ICD-10-CM | POA: Diagnosis not present

## 2022-12-10 DIAGNOSIS — H401111 Primary open-angle glaucoma, right eye, mild stage: Secondary | ICD-10-CM | POA: Diagnosis not present

## 2022-12-11 ENCOUNTER — Ambulatory Visit (INDEPENDENT_AMBULATORY_CARE_PROVIDER_SITE_OTHER): Payer: Medicare Other | Admitting: Endocrinology

## 2022-12-11 ENCOUNTER — Other Ambulatory Visit: Payer: Self-pay | Admitting: *Deleted

## 2022-12-11 ENCOUNTER — Encounter: Payer: Self-pay | Admitting: Endocrinology

## 2022-12-11 VITALS — BP 148/72 | HR 83 | Wt 144.4 lb

## 2022-12-11 DIAGNOSIS — Z794 Long term (current) use of insulin: Secondary | ICD-10-CM | POA: Diagnosis not present

## 2022-12-11 DIAGNOSIS — E1165 Type 2 diabetes mellitus with hyperglycemia: Secondary | ICD-10-CM

## 2022-12-11 DIAGNOSIS — I739 Peripheral vascular disease, unspecified: Secondary | ICD-10-CM

## 2022-12-11 DIAGNOSIS — I70235 Atherosclerosis of native arteries of right leg with ulceration of other part of foot: Secondary | ICD-10-CM

## 2022-12-11 NOTE — Patient Instructions (Addendum)
Check blood sugars on waking up 2 days a week  Also check blood sugars about 2 hours after meals and do this after different meals by rotation  Recommended blood sugar levels on waking up are 90-130 and about 2 hours after meal is 130-160  Please bring your blood sugar monitor to each visit, thank you  Take Jardiance daily  Take Repaglanide before breakfast and dinner

## 2022-12-11 NOTE — Progress Notes (Signed)
Patient ID: [name], female   DOB: 11-27-1940, 82 y.o.   MRN: [id number]          Reason for Appointment: Follow-up for endocrinology problems   History of Present Illness:          Date of diagnosis of type 2 diabetes mellitus: 1984       Background history:   She has been on metformin since about the time of her diagnosis and has been on other drugs also but has not had many changes in her regimen in the last several years She thinks her blood sugar used to be fairly well controlled At some point she was given Byetta for about a year and a half which improved her control but this was subsequently start because of it being too expensive for her.  She had no side effects with this  Recent history:   Her A1c is relatively better at 7.5 %  Non-insulin hypoglycemic regimen: Prandin 2 mg in a.m. metformin 1 g daily, Jardiance irregularly  Insulin: Humalog mix 8 units before breakfast  Current management, level of control, blood sugar patterns and problems identified:  She has continued her insulin as before Although she was told to take Jardiance regularly she is misunderstood directions and instead of taking Prandin in the evening she is taking Jardiance with higher carbohydrate meals only With this her blood sugars are mostly higher after dinner but checked very sporadically Blood sugars in the afternoons are quite variable Unclear whether she has some readings after breakfast in the morning but likely not significantly high with taking Prandin before breakfast She is starting to use an exercise bike at home very regularly since she is not able to walk much No hypoglycemia As before fasting readings are usually quite good  Side effects from medications have been: None  Hypoglycemia:   None       Blood Glucose data from One Touch meter download:   PRE-MEAL Fasting Lunch Dinner Bedtime Overall  Glucose range: 72-174   177, 230   Mean/median: 114    134   POST-MEAL PC  Breakfast PC Lunch PC Dinner  Glucose range:  94-183   Mean/median:      Prior   PRE-MEAL Fasting Midday Dinner Bedtime Overall  Glucose range: 79-155 82-272   79-272  Mean/median: 105 188   137   POST-MEAL PC Breakfast PC Lunch PC Dinner  Glucose range:  184 196, 233  Mean/median:         Self-care: The diet that the patient has been following is: tries to limit sweets and fried food.     Meal times are:  Breakfast is at 8:30 AM lunch: 1 PM, dinner: 6:30 PM  Typical meal intake: Breakfast is yogurt/cereal with fruit or egg/toast or oatmeal Sometimes drinking boost  Lunch is usually soup with crackers, dinner is usually a low-fat meat with vegetables Snacks will be celery and ranch dressing               Dietician visit, most recent: 10/18                Weight history:  Wt Readings from Last 3 Encounters:  12/11/22 144 lb 6.4 oz (65.5 kg)  11/16/22 142 lb (64.4 kg)  11/09/22 142 lb (64.4 kg)    Glycemic control:   Lab Results  Component Value Date   HGBA1C 7.5 (H) 12/07/2022   HGBA1C 7.9 (H) 08/28/2022   HGBA1C 8.0 (H) 06/06/2022  Lab Results  Component Value Date   MICROALBUR 19.1 (H) 08/28/2022   LDLCALC 50 10/14/2021   CREATININE 1.03 12/07/2022   Lab Results  Component Value Date   MICRALBCREAT 54.0 (H) 08/28/2022    Lab Results  Component Value Date   FRUCTOSAMINE 261 11/28/2021   FRUCTOSAMINE 275 09/29/2021   FRUCTOSAMINE 237 03/29/2021   Lab Results  Component Value Date   HGB 11.9 (L) 11/16/2022      Allergies as of 12/11/2022       Reactions   Versed [midazolam] Anxiety   Frantic, out of my mind, agitated    Augmentin [amoxicillin-pot Clavulanate] Other (See Comments)   Elevates potassium level   Bactrim [sulfamethoxazole-trimethoprim] Other (See Comments)   ^ K+( elevated)    Demerol [meperidine]    Delusional    Meperidine Hcl    Other reaction(s): delusional   Scopolamine    Delusional    Tramadol Other (See Comments)    Keeps awake        Medication List        Accurate as of December 11, 2022 11:21 AM. If you have any questions, ask your nurse or doctor.          STOP taking these medications    glimepiride 1 MG tablet Commonly known as: AMARYL Stopped by: Elayne Snare, MD       TAKE these medications    acetaminophen 500 MG tablet Commonly known as: TYLENOL Take 1 tablet (500 mg total) by mouth every 8 (eight) hours as needed for mild pain.   aspirin EC 81 MG tablet Take 81 mg by mouth at bedtime. Swallow whole.   atorvastatin 80 MG tablet Commonly known as: LIPITOR TAKE 1 TABLET BY MOUTH ONCE DAILY. MUST  KEEP  UPCOMING  APPOINTMENT  FOR  FUTURE  REFILLS What changed: See the new instructions.   B-12 PO Take 1,000 mcg by mouth daily with supper.   brimonidine 0.2 % ophthalmic solution Commonly known as: ALPHAGAN Place 1 drop into the right eye 2 (two) times daily.   CINNAMON PO Take 2,400 mg by mouth in the morning, at noon, and at bedtime. 1200 mg each Breakfast, supper & bedtime.   [employer] SENIOR PROBIOTIC PO Take 1 capsule by mouth every evening.   hydrochlorothiazide 12.5 MG tablet Commonly known as: HYDRODIURIL Take 1 tablet by mouth once daily   Insulin Lispro Prot & Lispro (75-25) 100 UNIT/ML Kwikpen Commonly known as: HumaLOG Mix 75/25 KwikPen Inject 15 Units into the skin daily with breakfast. What changed:  how much to take how to take this when to take this additional instructions   Jardiance 25 MG Tabs tablet Generic drug: empagliflozin TAKE 1 TABLET BY MOUTH ONCE DAILY BEFORE BREAKFAST What changed:  how much to take when to take this additional instructions   metFORMIN 1000 MG tablet Commonly known as: GLUCOPHAGE Take 1 tablet by mouth twice daily   metoprolol tartrate 25 MG tablet Commonly known as: LOPRESSOR TAKE 1 & 1/2 (ONE & ONE-HALF) TABLETS BY MOUTH TWICE DAILY   OneTouch Delica Plus 0000000 Misc Use to check blood sugars twice  daily. What changed: additional instructions   OneTouch Verio test strip Generic drug: glucose blood USE TO CHECK BLOOD SUGARS ONCE DAILY   pantoprazole 40 MG tablet Commonly known as: PROTONIX Take 1 tablet by mouth once daily What changed:  when to take this additional instructions   repaglinide 2 MG tablet Commonly known as: PRANDIN TAKE 1 TABLET BY  MOUTH ONCE DAILY BEFORE BREAKFAST   Systane Balance 0.6 % Soln Generic drug: Propylene Glycol Place 1 drop into the left eye 3 (three) times daily as needed (dry/irritated eyes).   telmisartan 80 MG tablet Commonly known as: MICARDIS Take 1 tablet by mouth once daily   Ultra-Thin II Mini Pen Needle 31G X 5 MM Misc Generic drug: Insulin Pen Needle USE AS DIRECTED   VITAMIN D3 GUMMIES PO Take 2 tablets by mouth at bedtime.   Vitamin K2 100 MCG Tabs Take 100 mcg by mouth at bedtime.   Xarelto 2.5 MG Tabs tablet Generic drug: rivaroxaban Take 1 tablet by mouth twice daily        Allergies:  Allergies  Allergen Reactions   Versed [Midazolam] Anxiety    Frantic, out of my mind, agitated    Augmentin [Amoxicillin-Pot Clavulanate] Other (See Comments)    Elevates potassium level   Bactrim [Sulfamethoxazole-Trimethoprim] Other (See Comments)    ^ K+( elevated)    Demerol [Meperidine]     Delusional    Meperidine Hcl     Other reaction(s): delusional   Scopolamine     Delusional    Tramadol Other (See Comments)    Keeps awake    Past Medical History:  Diagnosis Date   Anemia    after CABG in june 2021   Arthritis    Cancer ([place])    removal from nose - MOSE procedure    Complication of anesthesia    VERSED- agitated, muscle spasms, "jerking" , frantic , (never had this occurence in the pas)    Coronary artery disease    Diabetes mellitus without complication ([place])    Dysrhythmia    PVC's   GERD (gastroesophageal reflux disease)    Heart murmur    History of hiatal hernia    Hyperlipidemia     Hypertension 11/20/11   ECHO- EF>55% Borderline concentric left ventricular hypertrophy. There is a small calcified mass in the L:A near the LA appendage. No valvular masses seen with associated mitral annular calcification. LA Volume/ BSA27.4 ml/m2 No AS. Right ventricular systolic pressure is elevated at 46mmHg.   Myocardial infarction [facility])    June 2021   Neuromuscular disorder ([place])    neuropathy in bilateral feet   Peripheral vascular disease ([place])    Pneumonia    not hosp.    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  PTCAs with drug coated balloon Lt ant tibial artery and Lt popliteal artery  11/26/2019    Past Surgical History:  Procedure Laterality Date   ABDOMINAL AORTAGRAM  11/25/2019   ABDOMINAL AORTOGRAM W/LOWER EXTREMITY    ABDOMINAL AORTOGRAM N/A 06/15/2019   Procedure: ABDOMINAL AORTOGRAM;  Surgeon: [name], MD;  Location: [place] [facility];  Service: Cardiovascular;  Laterality: N/A;   ABDOMINAL AORTOGRAM W/LOWER EXTREMITY N/A 11/25/2019   Procedure: ABDOMINAL AORTOGRAM W/LOWER EXTREMITY;  Surgeon: [name], MD;  Location: [place] [facility];  Service: Cardiovascular;  Laterality: N/A;  Lt leg   ABDOMINAL AORTOGRAM W/LOWER EXTREMITY N/A 08/01/2020   Procedure: ABDOMINAL AORTOGRAM W/LOWER EXTREMITY;  Surgeon: [name], MD;  Location: [place] [facility];  Service: Cardiovascular;  Laterality: N/A;   ABDOMINAL AORTOGRAM W/LOWER EXTREMITY N/A 04/28/2021   Procedure: ABDOMINAL AORTOGRAM W/LOWER EXTREMITY;  Surgeon: [name], MD;  Location: [place] [facility];  Service: Cardiovascular;  Laterality: N/A;   ABDOMINAL AORTOGRAM W/LOWER EXTREMITY N/A 10/13/2021   Procedure: ABDOMINAL AORTOGRAM W/LOWER EXTREMITY;  Surgeon: [name], MD;  Location: [place] [facility];  Service: Cardiovascular;  Laterality: N/A;   ABDOMINAL AORTOGRAM W/LOWER EXTREMITY N/A 11/09/2022   Procedure: ABDOMINAL AORTOGRAM W/LOWER EXTREMITY;  Surgeon: [name], MD;  Location: [place] [facility];  Service: Cardiovascular;  Laterality: N/A;   ABDOMINAL AORTOGRAM W/LOWER EXTREMITY N/A 11/16/2022   Procedure: ABDOMINAL AORTOGRAM W/LOWER EXTREMITY;  Surgeon: [name], MD;  Location: [place] [facility];  Service: Cardiovascular;  Laterality: N/A;   AMPUTATION TOE Left 05/03/2021   Procedure: AMPUTATION OF THIRD LEFT  TOE;  Surgeon: [name], MD;  Location: [place];  Service: Vascular;  Laterality: Left;   APPENDECTOMY     APPLICATION OF WOUND VAC Right 07/21/2019   Procedure: Application Of Prevena Wound Vac Right Groin;  Surgeon: [name], MD;  [facility]: [facility];  Service: Vascular;  Laterality: Right;   CARPAL TUNNEL RELEASE Left    CARPAL TUNNEL RELEASE Right    CESAREAN SECTION     x 2   CHOLECYSTECTOMY N/A 10/12/2022   Procedure: LAPAROSCOPIC PARTIAL FENESTRATING CHOLECYSTECTOMY;  Surgeon: Dwan Bolt, MD;  Location: [place];  Service: General;  Laterality: N/A;   COLONOSCOPY     CORONARY ARTERY BYPASS GRAFT N/A 03/24/2020   Procedure: CORONARY ARTERY BYPASS GRAFTING (CABG) using LIMA to LAD (m); RIMA to RAMUS; Endoscopic Right Greater Saphenous Vein: SVG to Diag1; SVG to PLB (right); and SVG to PL (left).;  Surgeon: [name], MD;  Location: [place];  Service: Open Heart Surgery;  Laterality: N/A;  BILATERAL IMA   ENDARTERECTOMY FEMORAL Right 07/21/2019   Procedure: RIGHT ENDARTERECTOMY FEMORAL WITH PATCH ANGIOPLASTY;  Surgeon: [name], MD;  Location: [place];  Service: Vascular;  Laterality: Right;   ENDARTERECTOMY FEMORAL Right 08/16/2020   Procedure: RIGHT FEMORAL ENDARTERECTOMY;  Surgeon: [name], MD;  Location: [place];  Service: Vascular;  Laterality: Right;   ENDOVEIN HARVEST OF GREATER SAPHENOUS VEIN Right 03/24/2020   Procedure: ENDOVEIN HARVEST OF [place];  Surgeon: [name], MD;  Location: [place];  Service: Open Heart Surgery;  Laterality: Right;   EYE  SURGERY     cataract removal bilaterally   FEMORAL-POPLITEAL BYPASS GRAFT Right 08/16/2020   Procedure: BYPASS GRAFT FEMORAL-POPLITEAL ARTERY;  Surgeon: [name], MD;  Location: [place];  Service: Vascular;  Laterality: Right;   FEMORAL-POPLITEAL BYPASS GRAFT Left 05/03/2021   Procedure: LEFT FEMORAL TO BELOW KNEE POPLITEAL ARTERY BYPASS GRAFTING WITH 6MMX80 PTFE GRAFT;  Surgeon: [name], MD;  Location: [place];  Service: Vascular;  Laterality: Left;   INTRAVASCULAR LITHOTRIPSY  11/25/2019   Procedure: INTRAVASCULAR LITHOTRIPSY;  Surgeon: [name], MD;  Location: [place] [facility];  Service: Cardiovascular;;  LT. SFA   LEFT HEART CATH AND CORONARY ANGIOGRAPHY N/A 03/22/2020   Procedure: LEFT HEART CATH AND CORONARY ANGIOGRAPHY;  Surgeon: Martinique, Peter M, MD;  Location: [place] [facility];  Service: Cardiovascular;  Laterality: N/A;   LOWER EXTREMITY ANGIOGRAPHY Bilateral 06/15/2019   Procedure: Lower Extremity Angiography;  Surgeon: [name], MD;  Location: [place] [facility];  Service: Cardiovascular;  Laterality: Bilateral;   LOWER EXTREMITY ANGIOGRAPHY Right 08/03/2019   Procedure: LOWER EXTREMITY ANGIOGRAPHY;  Surgeon: [name], MD;  Location: [place] [facility];  Service: Cardiovascular;  Laterality: Right;   LOWER EXTREMITY ANGIOGRAPHY N/A 09/07/2021   Procedure: LOWER EXTREMITY ANGIOGRAPHY;  Surgeon: Marty Heck, MD;  [facility]: [facility]  IN[facility];  Service: Cardiovascular;  Laterality: N/A;   PATCH ANGIOPLASTY Right 07/21/2019   Procedure: Patch Angioplasty Right Femoral Artery;  Surgeon: [name], MD;  Location: [place];  Service: Vascular;  Laterality: Right;   PERIPHERAL VASCULAR ATHERECTOMY  08/03/2019   Procedure: PERIPHERAL VASCULAR ATHERECTOMY;  Surgeon: [name], MD;  Location: [place] [facility];  Service: Cardiovascular;;  right SFA, right TP trunk   PERIPHERAL VASCULAR ATHERECTOMY  11/25/2019   Procedure:  PERIPHERAL VASCULAR ATHERECTOMY;  Surgeon: [name], MD;  Location: [place] [facility];  Service: Cardiovascular;;  Lt.  POPLITEAL and AT   PERIPHERAL VASCULAR BALLOON ANGIOPLASTY Left 06/15/2019   Procedure: PERIPHERAL VASCULAR BALLOON ANGIOPLASTY;  Surgeon: [name], MD;  Location: [place] [facility];  Service: Cardiovascular;  Laterality: Left;  SFA UNSUCCESSFUL UNABLE TO CROSS LESION   PERIPHERAL VASCULAR BALLOON ANGIOPLASTY  08/03/2019   Procedure: PERIPHERAL VASCULAR BALLOON ANGIOPLASTY;  Surgeon: [name], MD;  Location: [place] [facility];  Service: Cardiovascular;;  right SFA, Right TP trunk   PERIPHERAL VASCULAR BALLOON ANGIOPLASTY  09/07/2021   Procedure: PERIPHERAL VASCULAR BALLOON ANGIOPLASTY;  Surgeon: Marty Heck, MD;  Location: [place] [facility];  Service: Cardiovascular;;   PERIPHERAL VASCULAR BALLOON ANGIOPLASTY Right 10/13/2021   Procedure: PERIPHERAL VASCULAR BALLOON ANGIOPLASTY;  Surgeon: [name], MD;  Location: [place] [facility];  Service: Cardiovascular;  Laterality: Right;   PERIPHERAL VASCULAR BALLOON ANGIOPLASTY  11/09/2022   Procedure: PERIPHERAL VASCULAR BALLOON ANGIOPLASTY;  Surgeon: [name], MD;  Location: [place] [facility];  Service: Cardiovascular;;  fem-pop bypass and AT   TEE WITHOUT CARDIOVERSION N/A 03/24/2020   Procedure: TRANSESOPHAGEAL ECHOCARDIOGRAM (TEE);  Surgeon: [name], MD;  Location: [place];  Service: Open Heart Surgery;  Laterality: N/A;   TONSILLECTOMY     and adenoidectomy   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/2019   Procedure: TRANSMETATARSAL AMPUTATION;  Surgeon: Trula Slade, DPM;  Location: [place];  Service: Podiatry;  Laterality: Right;   TRANSMETATARSAL AMPUTATION Left 05/11/2021   Procedure: LEFT TRANSMETATARSAL AMPUTATION;  Surgeon: [name], MD;  [facility];  Service: Vascular;  Laterality: Left;   TUBAL LIGATION      Family History  Problem  Relation Age of Onset   Cancer - Prostate Father    Cancer - Colon Father    Stroke Mother    Hypertension Mother    Hyperlipidemia Mother    Melanoma Brother     Social History:  reports that she quit smoking about 50 years ago. Her smoking use included cigarettes. She has never used smokeless tobacco. She reports that she does not drink alcohol and does not use drugs.   Review of Systems   Lipid history: Hypercholesterolemia treated with 80 mg Lipitor    Lab Results  Component Value Date   CHOL 97 10/14/2021   HDL 29 (L) 10/14/2021   LDLCALC 50 10/14/2021   TRIG 89 10/14/2021   CHOLHDL 3.3 10/14/2021           Hypertension: Present for several years, currently treated with  Micardis 80 mg, metoprolol, and HCTZ, also followed by cardiologist  Also checks blood pressure at home  BP Readings from Last 3 Encounters:  12/11/22 (!) 148/72  11/16/22 (!) 140/50  11/09/22 (!) 136/37   Renal function history:  Lab Results  Component Value Date   CREATININE 1.03 12/07/2022   CREATININE 1.10 (H) 11/16/2022   CREATININE 1.00  11/09/2022     Most recent eye exam was in 11/2021   She has had significant PAD followed by vascular cardiologist   LABS:  Lab on 12/07/2022  Component Date Value Ref Range Status   Hgb A1c MFr Bld 12/07/2022 7.5 (H)  4.6 - 6.5 % Final   Glycemic Control Guidelines for People with Diabetes:Non Diabetic:  <6%Goal of Therapy: <7%Additional Action Suggested:  >8%    Sodium 12/07/2022 142  135 - 145 mEq/L Final   Potassium 12/07/2022 4.3  3.5 - 5.1 mEq/L Final   Chloride 12/07/2022 102  96 - 112 mEq/L Final   CO2 12/07/2022 29  19 - 32 mEq/L Final   Glucose, Bld 12/07/2022 137 (H)  70 - 99 mg/dL Final   BUN 12/07/2022 23  6 - 23 mg/dL Final   Creatinine, Ser 12/07/2022 1.03  0.40 - 1.20 mg/dL Final   GFR 12/07/2022 51.27 (L)  >60.00 mL/min Final   Calculated using the CKD-EPI Creatinine Equation (2021)   Calcium 12/07/2022 10.5  8.4 - 10.5  mg/dL Final    Physical Examination:  BP (!) 148/72 (BP Location: Left Arm, Patient Position: Sitting, Cuff Size: Normal)   Pulse 83   Wt 144 lb 6.4 oz (65.5 kg)   SpO2 98%   BMI 24.03 kg/m       ASSESSMENT:  Diabetes type 2 non-insulin-dependent  See history of present illness for detailed discussion of current diabetes management, blood sugar patterns and problems identified  She is on premixed insulin in the morning, metformin and Prandin 2 mg at breakfast  Her A1c is still about the same  Blood sugars are somewhat better with her taking her insulin regularly However not taking Jardiance regularly from not understanding the directions Only appears to have higher readings after dinner and occasionally after lunch, not checking much after breakfast  PLAN:    No change in the premixed insulin in the morning   Restart Jardiance daily instead of as needed  She will start Prandin 2 mg at dinnertime to cover her evening meal However if blood sugars after dinner are continuously higher will consider mealtime insulin  She is still not interested in the CGM and she thinks she can check her sugars more often Discussed checking readings more often after meals and only twice a week in the morning   There are no Patient Instructions on file for this visit.    Elayne Snare 12/11/2022, 11:21 AM   Note: This office note was prepared with Dragon voice recognition system technology. Any transcriptional errors that result from this process are unintentional.

## 2022-12-12 DIAGNOSIS — H5052 Exophoria: Secondary | ICD-10-CM | POA: Diagnosis not present

## 2022-12-17 ENCOUNTER — Ambulatory Visit (INDEPENDENT_AMBULATORY_CARE_PROVIDER_SITE_OTHER): Payer: Medicare Other

## 2022-12-17 DIAGNOSIS — I739 Peripheral vascular disease, unspecified: Secondary | ICD-10-CM

## 2022-12-17 DIAGNOSIS — Z89431 Acquired absence of right foot: Secondary | ICD-10-CM

## 2022-12-17 DIAGNOSIS — E1142 Type 2 diabetes mellitus with diabetic polyneuropathy: Secondary | ICD-10-CM

## 2022-12-17 DIAGNOSIS — Z89432 Acquired absence of left foot: Secondary | ICD-10-CM

## 2022-12-17 NOTE — Progress Notes (Signed)
Patient presents to the office today for diabetic shoe and insole measuring.  Patient was measured with brannock device to determine size and width for 1 pair of extra depth shoes and foam casted for 3 pair of insoles.   ABN signed.   Documentation of medical necessity will be sent to patient's treating diabetic doctor to verify and sign.   Patient's diabetic provider: DR Dwyane Dee  Shoes and insoles will be ordered at that time and patient will be notified for an appointment for fitting when they arrive.    Patient shoe selection-   1st   Shoe choice:   [employer]  Shoe size ordered: 47M

## 2022-12-18 ENCOUNTER — Other Ambulatory Visit: Payer: Self-pay

## 2022-12-18 ENCOUNTER — Other Ambulatory Visit: Payer: Self-pay | Admitting: Endocrinology

## 2022-12-18 MED ORDER — REPAGLINIDE 2 MG PO TABS: ORAL_TABLET | ORAL | 2 refills | Status: DC

## 2022-12-24 NOTE — Progress Notes (Unsigned)
[facility] [place]  ASSESSMENT / PLAN: 81 y.o. female with complex past vascular history. She has concerning features on duplex today of bilateral lower extremity bypasses. The velocities in the left are <40, suggesting threatened bypass. She has severe stenosis in the right graft as well. Plan angiography to try to salvage the bypass grafts. Will focus on the left bypass Friday 11/09/22.   CHIEF COMPLAINT: surveillance of PAD  HISTORY OF PRESENT ILLNESS: [name] is a 81 y.o. female very well-known to me.  She returns to clinic after endovascular intervention in her right lower extremity to salvage her right femoral-popliteal bypass graft.  She tolerated this very well.  She did receive Versed, which he has a poor reaction to in the past.  She required observation overnight.  She was discharged next day without difficulty.  She is getting IV antibiotics.  She is getting hyperbaric oxygen therapy.  These both seem to be helping her foot.  She feels well overall.  02/13/22: Patient returns to clinic for follow-up.  Thankfully both feet are completely healed.  She is due to be seen by the podiatry team for foot orthotics.  She is very happy overall.  10/30/22: Doing well on surveillance. No new symptoms. No ulcers about feet. 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 Has recovered and is back to playing pool.  VASCULAR SURGICAL HISTORY:  07/21/19: Right external iliac, common femoral, profundofemoral, and superficial femoral endarterectomy with bovine pericardial patch angioplasty 08/16/20: Right common femoral endarterectomy with bovine pericardial patch angioplasty; Right common femoral to above-knee popliteal artery bypass with 6 mm ringed PTFE 04/28/21: LLE angiogram 05/03/21: left common femoral - tibioperoneal trunk bypass with 6mm ringed ePTFE in anatomic tunnel; left third toe open amputation 05/11/21: Open left transmetatarsal amputation 09/07/21:  Angioplasty of left lower extremity distal bypass anastomosis and anterior tibial artery (3 mm Sterling) 10/13/21: Right iliofemoral drug-coated angioplasty (6x60mm [place]); Right popliteal drug-coated angioplasty (4x150mm [place]); Right tibioperoneal trunk and peroneal angioplasty (3x60mm Sterling)  Past Medical History:  Diagnosis Date   Anemia    after CABG in june 2021   Arthritis    Cancer (HCC)    removal from nose - MOSE procedure    Complication of anesthesia    VERSED- agitated, muscle spasms, "jerking" , frantic , (never had this occurence in the pas)    Coronary artery disease    Diabetes mellitus without complication (HCC)    Dysrhythmia    PVC's   GERD (gastroesophageal reflux disease)    Heart murmur    History of hiatal hernia    Hyperlipidemia    Hypertension 11/20/11   ECHO- EF>55% Borderline concentric left ventricular hypertrophy. There is a small calcified mass in the L:A near the LA appendage. No valvular masses seen with associated mitral annular calcification. LA Volume/ BSA27.4 ml/m2 No AS. Right ventricular systolic pressure is elevated at 33mmHg.   Myocardial infarction (HCC)    June 2021   Neuromuscular disorder (HCC)    neuropathy in bilateral feet   Peripheral vascular disease (HCC)    Pneumonia    not hosp.    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  PTCAs with drug coated balloon Lt ant tibial artery and Lt popliteal artery  11/26/2019    Past Surgical History:  Procedure Laterality Date   ABDOMINAL AORTAGRAM  11/25/2019   ABDOMINAL AORTOGRAM W/LOWER EXTREMITY    ABDOMINAL AORTOGRAM N/A 06/15/2019   Procedure: ABDOMINAL AORTOGRAM;  Surgeon:   [name], MD;  Location: [facility];  Service: Cardiovascular;  Laterality: N/A;   ABDOMINAL AORTOGRAM W/LOWER EXTREMITY N/A 11/25/2019   Procedure: ABDOMINAL AORTOGRAM W/LOWER EXTREMITY;  Surgeon: [name], MD;  Location: [place] [facility];  Service: Cardiovascular;   Laterality: N/A;  Lt leg   ABDOMINAL AORTOGRAM W/LOWER EXTREMITY N/A 08/01/2020   Procedure: ABDOMINAL AORTOGRAM W/LOWER EXTREMITY;  Surgeon: [name], MD;  Location: [facility];  Service: Cardiovascular;  Laterality: N/A;   ABDOMINAL AORTOGRAM W/LOWER EXTREMITY N/A 04/28/2021   Procedure: ABDOMINAL AORTOGRAM W/LOWER EXTREMITY;  Surgeon: [name], MD;  Location: [facility];  Service: Cardiovascular;  Laterality: N/A;   ABDOMINAL AORTOGRAM W/LOWER EXTREMITY N/A 10/13/2021   Procedure: ABDOMINAL AORTOGRAM W/LOWER EXTREMITY;  Surgeon: [name], MD;  Location: [facility];  Service: Cardiovascular;  Laterality: N/A;   ABDOMINAL AORTOGRAM W/LOWER EXTREMITY N/A 11/09/2022   Procedure: ABDOMINAL AORTOGRAM W/LOWER EXTREMITY;  Surgeon: [name], MD;  Location: [facility];  Service: Cardiovascular;  Laterality: N/A;   ABDOMINAL AORTOGRAM W/LOWER EXTREMITY N/A 11/16/2022   Procedure: ABDOMINAL AORTOGRAM W/LOWER EXTREMITY;  Surgeon: [name], MD;  Location: [facility];  Service: Cardiovascular;  Laterality: N/A;   AMPUTATION TOE Left 05/03/2021   Procedure: AMPUTATION OF THIRD LEFT  TOE;  Surgeon: [name], MD;  Location: [facility];  Service: Vascular;  Laterality: Left;   APPENDECTOMY     APPLICATION OF WOUND VAC Right 07/21/2019   Procedure: Application Of Prevena Wound Vac Right Groin;  Surgeon: [name], MD;  [facility]: [facility];  Service: Vascular;  Laterality: Right;   CARPAL TUNNEL RELEASE Left    CARPAL TUNNEL RELEASE Right    CESAREAN SECTION     x 2   CHOLECYSTECTOMY N/A 10/12/2022   Procedure: LAPAROSCOPIC PARTIAL FENESTRATING CHOLECYSTECTOMY;  Surgeon: Dwan Bolt, MD;  Location: [facility];  Service: General;  Laterality: N/A;   COLONOSCOPY     CORONARY ARTERY BYPASS GRAFT N/A 03/24/2020   Procedure: CORONARY ARTERY BYPASS GRAFTING (CABG) using LIMA to LAD (m); RIMA to RAMUS; Endoscopic Right Greater  Saphenous Vein: SVG to Diag1; SVG to PLB (right); and SVG to PL (left).;  Surgeon: [name], MD;  Location: [facility];  Service: Open Heart Surgery;  Laterality: N/A;  BILATERAL IMA   ENDARTERECTOMY FEMORAL Right 07/21/2019   Procedure: RIGHT ENDARTERECTOMY FEMORAL WITH PATCH ANGIOPLASTY;  Surgeon: [name], MD;  Location: [facility];  Service: Vascular;  Laterality: Right;   ENDARTERECTOMY FEMORAL Right 08/16/2020   Procedure: RIGHT FEMORAL ENDARTERECTOMY;  Surgeon: [name], MD;  Location: [place];  Service: Vascular;  Laterality: Right;   ENDOVEIN HARVEST OF GREATER SAPHENOUS VEIN Right 03/24/2020   Procedure: ENDOVEIN HARVEST OF [place];  Surgeon: [name], MD;  Location: [facility];  Service: Open Heart Surgery;  Laterality: Right;   EYE SURGERY     cataract removal bilaterally   FEMORAL-POPLITEAL BYPASS GRAFT Right 08/16/2020   Procedure: BYPASS GRAFT FEMORAL-POPLITEAL ARTERY;  Surgeon: [name], MD;  Location: [facility];  Service: Vascular;  Laterality: Right;   FEMORAL-POPLITEAL BYPASS GRAFT Left 05/03/2021   Procedure: LEFT FEMORAL TO BELOW KNEE POPLITEAL ARTERY BYPASS GRAFTING WITH 6MMX80 PTFE GRAFT;  Surgeon: [name], MD;  Location: [place];  Service: Vascular;  Laterality: Left;   INTRAVASCULAR LITHOTRIPSY  11/25/2019   Procedure: INTRAVASCULAR LITHOTRIPSY;  Surgeon: [name],  MD;  Location: [facility];  Service: Cardiovascular;;  LT. SFA   LEFT HEART CATH AND CORONARY ANGIOGRAPHY N/A 03/22/2020   Procedure: LEFT HEART CATH AND CORONARY ANGIOGRAPHY;  Surgeon: Martinique, Peter M, MD;  Location: [facility];  Service: Cardiovascular;  Laterality: N/A;   LOWER EXTREMITY ANGIOGRAPHY Bilateral 06/15/2019   Procedure: Lower Extremity Angiography;  Surgeon: [name], MD;  Location: [facility];  Service: Cardiovascular;  Laterality: Bilateral;   LOWER EXTREMITY ANGIOGRAPHY Right 08/03/2019    Procedure: LOWER EXTREMITY ANGIOGRAPHY;  Surgeon: [name], MD;  Location: [facility];  Service: Cardiovascular;  Laterality: Right;   LOWER EXTREMITY ANGIOGRAPHY N/A 09/07/2021   Procedure: LOWER EXTREMITY ANGIOGRAPHY;  Surgeon: Marty Heck, MD;  Location: [facility];  Service: Cardiovascular;  Laterality: N/A;   PATCH ANGIOPLASTY Right 07/21/2019   Procedure: Patch Angioplasty Right Femoral Artery;  Surgeon: [name], MD;  Location: [facility];  Service: Vascular;  Laterality: Right;   PERIPHERAL VASCULAR ATHERECTOMY  08/03/2019   Procedure: PERIPHERAL VASCULAR ATHERECTOMY;  Surgeon: [name], MD;  Location: [facility];  Service: Cardiovascular;;  right SFA, right TP trunk   PERIPHERAL VASCULAR ATHERECTOMY  11/25/2019   Procedure: PERIPHERAL VASCULAR ATHERECTOMY;  Surgeon: [name], MD;  Location: [facility];  Service: Cardiovascular;;  Lt.  POPLITEAL and AT   PERIPHERAL VASCULAR BALLOON ANGIOPLASTY Left 06/15/2019   Procedure: PERIPHERAL VASCULAR BALLOON ANGIOPLASTY;  Surgeon: [name], MD;  Location: [facility];  Service: Cardiovascular;  Laterality: Left;  SFA UNSUCCESSFUL UNABLE TO CROSS LESION   PERIPHERAL VASCULAR BALLOON ANGIOPLASTY  08/03/2019   Procedure: PERIPHERAL VASCULAR BALLOON ANGIOPLASTY;  Surgeon: [name], MD;  Location: [facility];  Service: Cardiovascular;;  right SFA, Right TP trunk   PERIPHERAL VASCULAR BALLOON ANGIOPLASTY  09/07/2021   Procedure: PERIPHERAL VASCULAR BALLOON ANGIOPLASTY;  Surgeon: Marty Heck, MD;  Location: [facility];  Service: Cardiovascular;;   PERIPHERAL VASCULAR BALLOON ANGIOPLASTY Right 10/13/2021   Procedure: PERIPHERAL VASCULAR BALLOON ANGIOPLASTY;  Surgeon: [name], MD;  Location: [facility];  Service: Cardiovascular;  Laterality: Right;   PERIPHERAL VASCULAR BALLOON ANGIOPLASTY  11/09/2022   Procedure:  PERIPHERAL VASCULAR BALLOON ANGIOPLASTY;  Surgeon: [name], MD;  Location: [facility];  Service: Cardiovascular;;  fem-pop bypass and AT   TEE WITHOUT CARDIOVERSION N/A 03/24/2020   Procedure: TRANSESOPHAGEAL ECHOCARDIOGRAM (TEE);  Surgeon: [name], MD;  Location: [facility];  Service: Open Heart Surgery;  Laterality: N/A;   TONSILLECTOMY     and adenoidectomy   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/2019   Procedure: TRANSMETATARSAL AMPUTATION;  Surgeon: Trula Slade, DPM;  Location: [facility];  Service: Podiatry;  Laterality: Right;   TRANSMETATARSAL AMPUTATION Left 05/11/2021   Procedure: LEFT TRANSMETATARSAL AMPUTATION;  Surgeon: [name], MD;  Location: [facility];  Service: Vascular;  Laterality: Left;   TUBAL LIGATION      Family History  Problem Relation Age of Onset   Cancer - Prostate Father    Cancer - Colon Father    Stroke Mother    Hypertension Mother    Hyperlipidemia Mother    Melanoma Brother     Social History   Socioeconomic History   Marital status: Widowed    Spouse name: Not on file   Number of children: 3   Years of education: Not on file  Highest education level: Not on file  Occupational History   Not on file  Tobacco Use   Smoking status: Former    Types: Cigarettes    Quit date: 03/31/1972    Years since quitting: 50.7   Smokeless tobacco: Never  Vaping Use   Vaping Use: Never used  Substance and Sexual Activity   Alcohol use: No   Drug use: No   Sexual activity: Not on file  Other Topics Concern   Not on file  Social History Narrative   Not on file   Social Determinants of Health   Financial Resource Strain: Not on file  Food Insecurity: No Food Insecurity (10/12/2022)   Hunger Vital Sign    Worried About Running Out of Food in the Last Year: Never true    Ran Out of Food in the Last Year: Never true  Transportation Needs: No Transportation Needs (10/12/2022)   PRAPARE - Radiographer, therapeutic (Medical): No    Lack of Transportation (Non-Medical): No  Physical Activity: Not on file  Stress: Not on file  Social Connections: Not on file  Intimate Partner Violence: Not At Risk (10/12/2022)   Humiliation, Afraid, Rape, and Kick questionnaire    Fear of Current or Ex-Partner: No    Emotionally Abused: No    Physically Abused: No    Sexually Abused: No    Allergies  Allergen Reactions   Versed [Midazolam] Anxiety    Frantic, out of my mind, agitated    Augmentin [Amoxicillin-Pot Clavulanate] Other (See Comments)    Elevates potassium level   Bactrim [Sulfamethoxazole-Trimethoprim] Other (See Comments)    ^ K+( elevated)    Demerol [Meperidine]     Delusional    Meperidine Hcl     Other reaction(s): delusional   Scopolamine     Delusional    Tramadol Other (See Comments)    Keeps awake    Current Outpatient Medications  Medication Sig Dispense Refill   acetaminophen (TYLENOL) 500 MG tablet Take 1 tablet (500 mg total) by mouth every 8 (eight) hours as needed for mild pain. 30 tablet 0   aspirin EC 81 MG tablet Take 81 mg by mouth at bedtime. Swallow whole.     atorvastatin (LIPITOR) 80 MG tablet TAKE 1 TABLET BY MOUTH ONCE DAILY. MUST  KEEP  UPCOMING  APPOINTMENT  FOR  FUTURE  REFILLS (Patient taking differently: Take 80 mg by mouth every evening.) 90 tablet 3   brimonidine (ALPHAGAN) 0.2 % ophthalmic solution Place 1 drop into the right eye 2 (two) times daily.     Cholecalciferol (VITAMIN D3 GUMMIES PO) Take 2 tablets by mouth at bedtime.     CINNAMON PO Take 2,400 mg by mouth in the morning, at noon, and at bedtime. 1200 mg each Breakfast, supper & bedtime.     Cyanocobalamin (B-12 PO) Take 1,000 mcg by mouth daily with supper.     hydrochlorothiazide (HYDRODIURIL) 12.5 MG tablet Take 1 tablet by mouth once daily 90 tablet 0   Insulin Lispro Prot & Lispro (HUMALOG MIX 75/25 KWIKPEN) (75-25) 100 UNIT/ML Kwikpen Inject 15 Units into the skin daily with  breakfast. (Patient taking differently: Inject 8 Units into the skin daily before breakfast.) 15 mL 2   Insulin Pen Needle (ULTRA-THIN II MINI PEN NEEDLE) 31G X 5 MM MISC USE AS DIRECTED 50 each 2   JARDIANCE 25 MG TABS tablet TAKE 1 TABLET BY MOUTH ONCE DAILY BEFORE BREAKFAST (Patient taking differently: Take  25 mg by mouth See admin instructions. Take 1 tablet (25 mg) by mouth in the evening as needed with high carbohydrates evening meals) 90 tablet 3   Lancets (ONETOUCH DELICA PLUS 123XX123) MISC Use to check blood sugars twice daily. (Patient taking differently: Use to check blood sugars  daily.) 100 each 5   Menatetrenone (VITAMIN K2) 100 MCG TABS Take 100 mcg by mouth at bedtime.     metFORMIN (GLUCOPHAGE) 1000 MG tablet Take 1 tablet by mouth twice daily 180 tablet 3   metoprolol tartrate (LOPRESSOR) 25 MG tablet TAKE 1 & 1/2 (ONE & ONE-HALF) TABLETS BY MOUTH TWICE DAILY 270 tablet 2   ONETOUCH VERIO test strip USE TO CHECK BLOOD SUGARS ONCE DAILY 100 each 2   pantoprazole (PROTONIX) 40 MG tablet Take 1 tablet by mouth once daily (Patient taking differently: Take 40 mg by mouth See admin instructions. Take Sun, Mon., Wed., and Friday in the evening) 90 tablet 3   Probiotic Product ([employer] SENIOR PROBIOTIC PO) Take 1 capsule by mouth every evening.     Propylene Glycol (SYSTANE BALANCE) 0.6 % SOLN Place 1 drop into the left eye 3 (three) times daily as needed (dry/irritated eyes).     repaglinide (PRANDIN) 2 MG tablet Take 1 tab by breakfast and 1 tab before dinner 180 tablet 2   telmisartan (MICARDIS) 80 MG tablet Take 1 tablet by mouth once daily 90 tablet 3   XARELTO 2.5 MG TABS tablet Take 1 tablet by mouth twice daily 60 tablet 0   Current Facility-Administered Medications  Medication Dose Route Frequency Provider Last Rate Last Admin   sodium chloride flush (NS) 0.9 % injection 3 mL  3 mL Intravenous Q12H [name], MD        PHYSICAL EXAM There were no vitals filed for this  visit.    Constitutional: well appearing. no distress. Appears well nourished.  Neurologic: CN intact. no focal findings. no sensory loss. Psychiatric:  Mood and affect symmetric and appropriate. Eyes:  No icterus. No conjunctival pallor. Ears, nose, throat:  mucous membranes moist. Midline trachea.  Cardiac: regular rate and rhythm.  Respiratory:  unlabored. Abdominal:  soft, non-tender, non-distended.  Peripheral vascular: no palpable pedal pulses Extremity: no edema. no cyanosis. no pallor.  Lymphatic: no Stemmer's sign. n palpable lymphadenopathy.  PERTINENT LABORATORY AND RADIOLOGIC DATA  Most recent CBC    Latest Ref Rng & Units 11/16/2022    8:30 AM 11/09/2022    6:08 AM 10/13/2022    8:25 AM  CBC  WBC 4.0 - 10.5 K/uL   18.0   Hemoglobin 12.0 - 15.0 g/dL 11.9  11.6  10.8   Hematocrit 36.0 - 46.0 % 35.0  34.0  33.6   Platelets 150 - 400 K/uL   267      Most recent CMP    Latest Ref Rng & Units 12/07/2022    8:45 AM 11/16/2022    8:30 AM 11/09/2022    6:08 AM  CMP  Glucose 70 - 99 mg/dL 137  129  128   BUN 6 - 23 mg/dL 23  22  30    Creatinine 0.40 - 1.20 mg/dL 1.03  1.10  1.00   Sodium 135 - 145 mEq/L 142  142  143   Potassium 3.5 - 5.1 mEq/L 4.3  3.7  3.9   Chloride 96 - 112 mEq/L 102  102  105   CO2 19 - 32 mEq/L 29     Calcium 8.4 -  10.5 mg/dL 10.5       Renal function Estimated Creatinine Clearance: 39.2 mL/min (by C-G formula based on SCr of 1.03 mg/dL).  Hgb A1c MFr Bld (%)  Date Value  12/07/2022 7.5 (H)    LDL (calc)  Date Value Ref Range Status  04/06/2013 87 <100 mg/dL Final    Comment:    LDL-C is inaccurate if patient is nonfasting.   Reference Range: ---------------- Optimal:            <100 Near/Above Optimal: 100-129 Borderline High:    130-159 High:               160-189 Very High:          >=190       LDL Chol Calc (NIH)  Date Value Ref Range Status  07/28/2020 54 0 - 99 mg/dL Final   LDL Cholesterol  Date Value Ref Range  Status  10/14/2021 50 0 - 99 mg/dL Final    Comment:           Total Cholesterol/HDL:CHD Risk Coronary Heart Disease Risk Table                     Men   Women  1/2 Average Risk   3.4   3.3  Average Risk       5.0   4.4  2 X Average Risk   9.6   7.1  3 X Average Risk  23.4   11.0        Use the calculated Patient Ratio above and the CHD Risk Table to determine the patient's CHD Risk.        ATP III CLASSIFICATION (LDL):  <100     mg/dL   Optimal  100-129  mg/dL   Near or Above                    Optimal  130-159  mg/dL   Borderline  160-189  mg/dL   High  >190     mg/dL   Very High Performed at [facility] [address] [place], [place] [address]      +-------+-----------+-----------+------------+------------+  ABI/TBIToday's ABIToday's TBIPrevious ABIPrevious TBI  +-------+-----------+-----------+------------+------------+  Right [place]         N/A        0.68        N/A           +-------+-----------+-----------+------------+------------+  Left  0.83       N/A        0.75        N/A           +-------+-----------+-----------+------------+------------+   Bilateral lower extremity arterial duplex  Right graft shows proximal stenosis; lowest velocity in graft is 60cm/s; outflow stenosis noted  Left graft shows proximal stenosis; lowest velocity 33cm/s; outflow stenosis.  [name]. Stanford Breed, MD [facility] Phone Number: [phone] 12/24/2022 2:22 PM  Total time spent on preparing this encounter including chart review, data review, collecting history, examining the patient, coordinating care for this established patient, 30 minutes.  Portions of this report may have been transcribed using voice recognition software.  Every effort has been made to ensure accuracy; however, inadvertent computerized transcription errors may still be present.

## 2022-12-25 ENCOUNTER — Ambulatory Visit (INDEPENDENT_AMBULATORY_CARE_PROVIDER_SITE_OTHER): Payer: Medicare Other | Admitting: Vascular Surgery

## 2022-12-25 ENCOUNTER — Ambulatory Visit (HOSPITAL_COMMUNITY)
Admission: RE | Admit: 2022-12-25 | Discharge: 2022-12-25 | Disposition: A | Discharge status: Home / Self Care | Payer: Medicare Other | Source: Ambulatory Visit | Attending: Vascular Surgery | Admitting: Vascular Surgery

## 2022-12-25 ENCOUNTER — Encounter: Payer: Self-pay | Admitting: Vascular Surgery

## 2022-12-25 ENCOUNTER — Ambulatory Visit (INDEPENDENT_AMBULATORY_CARE_PROVIDER_SITE_OTHER)
Admission: RE | Admit: 2022-12-25 | Discharge: 2022-12-25 | Disposition: A | Discharge status: Home / Self Care | Payer: Medicare Other | Source: Ambulatory Visit | Attending: Vascular Surgery | Admitting: Vascular Surgery

## 2022-12-25 VITALS — BP 142/58 | HR 70 | Temp 98.0°F | Resp 20 | Wt 143.0 lb

## 2022-12-25 DIAGNOSIS — I739 Peripheral vascular disease, unspecified: Secondary | ICD-10-CM

## 2022-12-25 DIAGNOSIS — I70235 Atherosclerosis of native arteries of right leg with ulceration of other part of foot: Secondary | ICD-10-CM

## 2022-12-26 LAB — VAS US ABI WITH/WO TBI
Left ABI: 0.42
Right ABI: 0.36

## 2023-01-01 ENCOUNTER — Other Ambulatory Visit: Payer: Self-pay

## 2023-01-01 DIAGNOSIS — I739 Peripheral vascular disease, unspecified: Secondary | ICD-10-CM

## 2023-01-08 ENCOUNTER — Ambulatory Visit: Payer: Medicare Other | Admitting: Cardiovascular Disease

## 2023-01-18 ENCOUNTER — Other Ambulatory Visit: Payer: Self-pay | Admitting: Vascular Surgery

## 2023-02-12 IMAGING — CR DG FOOT COMPLETE 3+V*L*
3 series · 3 of 3 positions shown · non-contrast
Comparison: 09/04/2019

CLINICAL DATA: Wound along the left third toe. History of diabetes.

EXAM:
LEFT FOOT - COMPLETE 3+ VIEW

[foot ap]
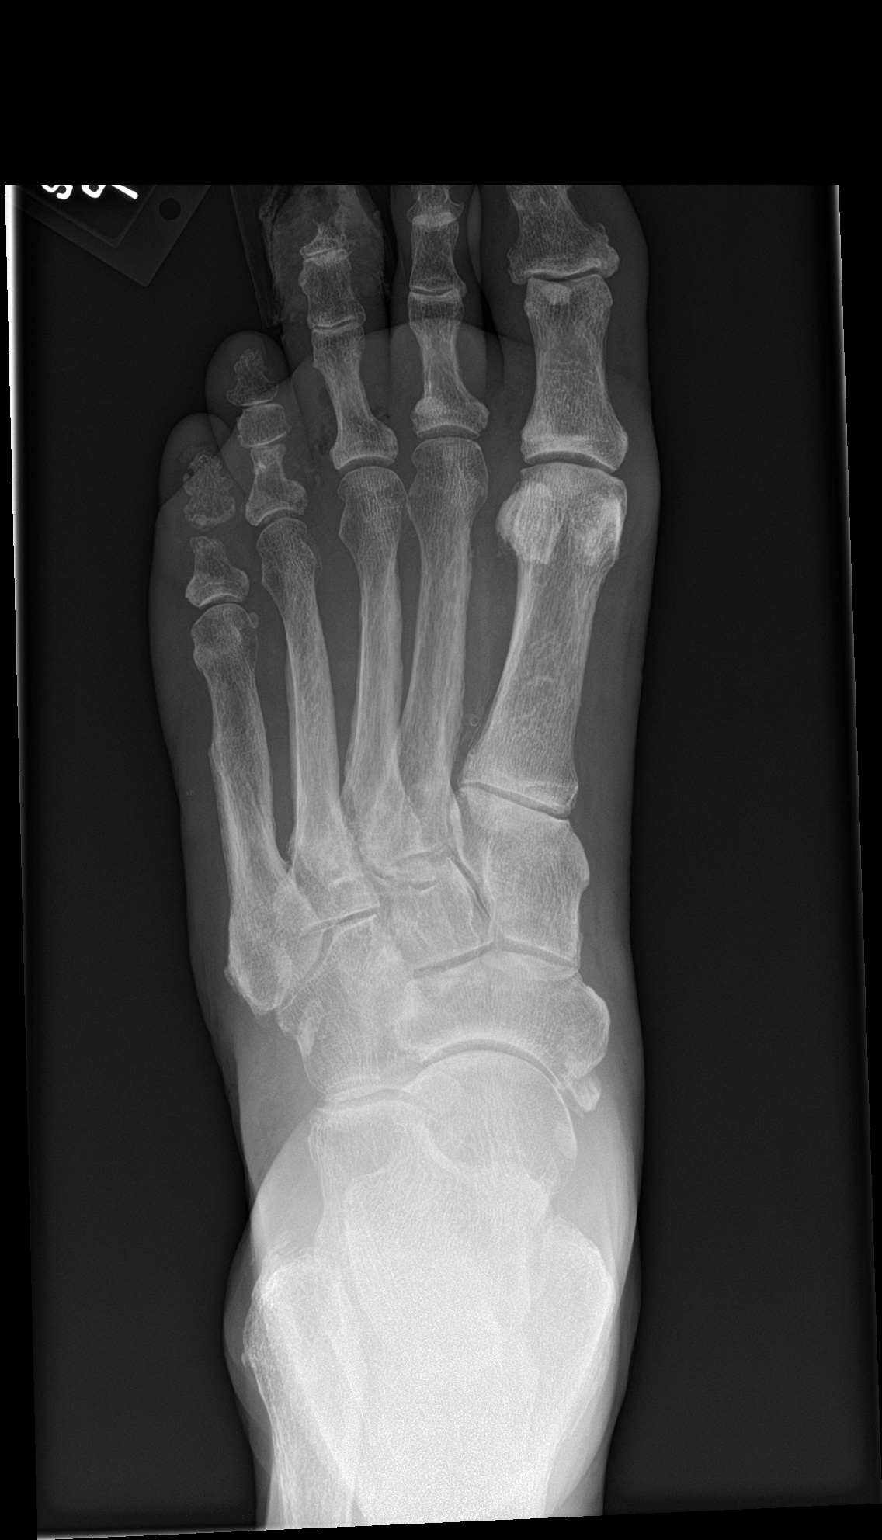

[foot obl]
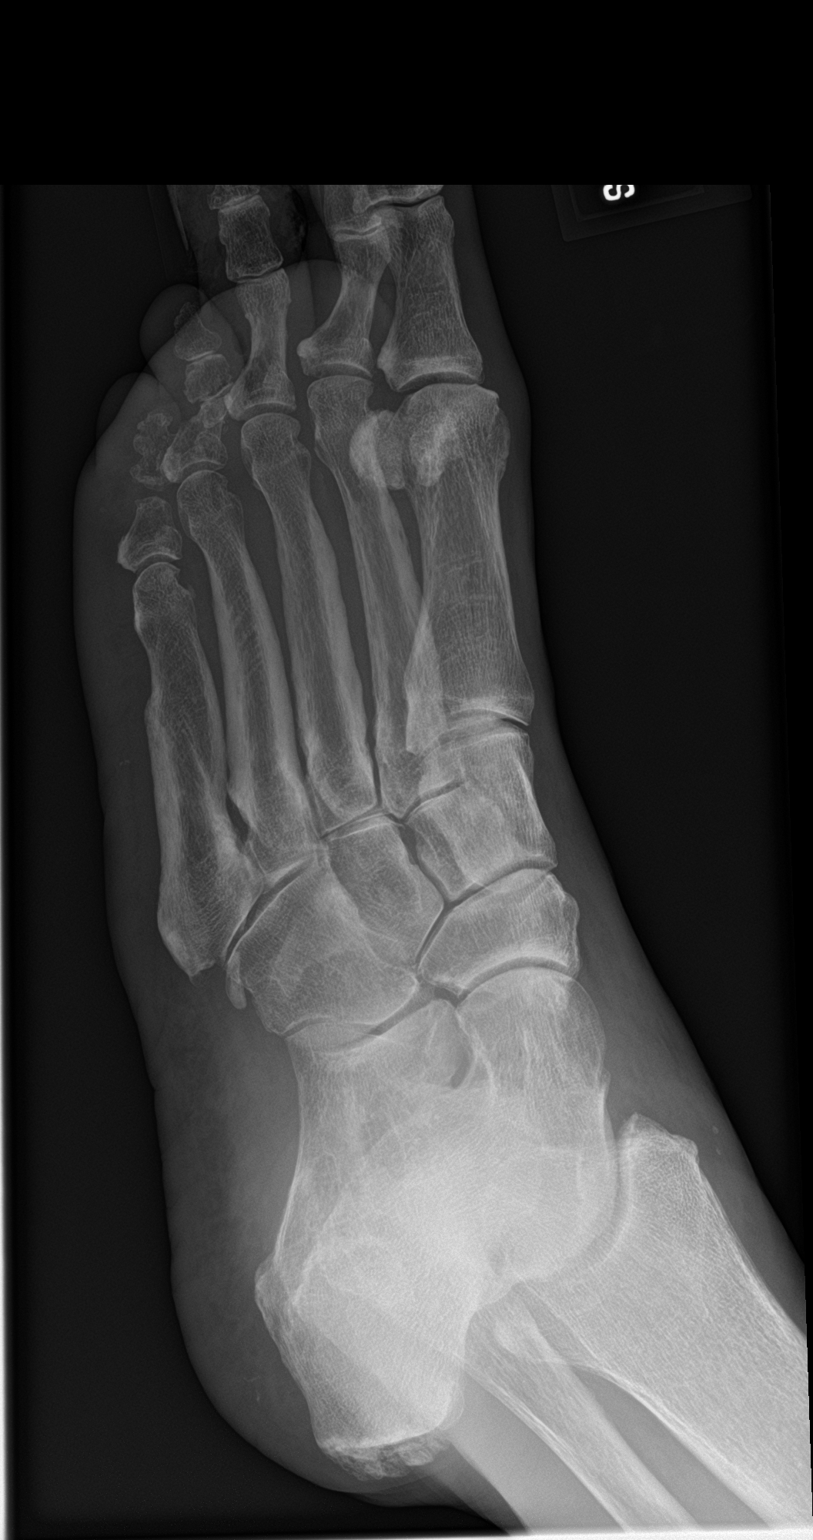

[foot lat]
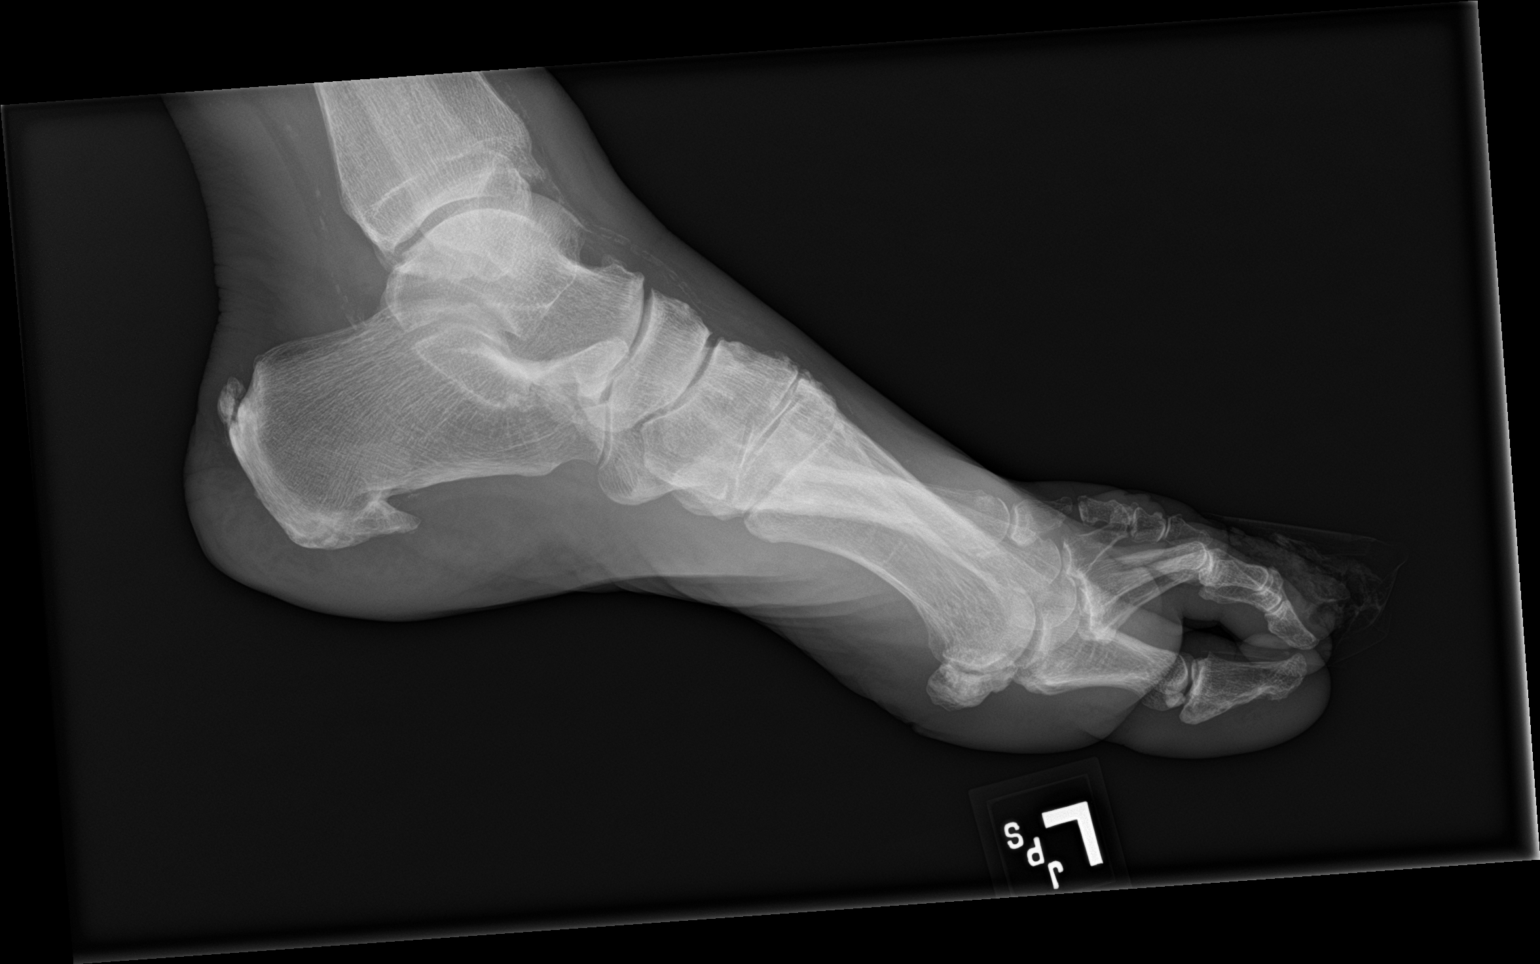

[3 of 3 positions shown; findings below may reference images not displayed]

FINDINGS: Osteomyelitis of the distal phalanx third toe noted with destructive
findings in the tuft of the distal phalanx. Irregularity of the
adjacent soft tissues noted. There is gas tracking in the soft
tissues of the proximal toe around the proximal metaphysis of the
proximal phalanx of the third toe, indicating soft tissue infection.

There is shortening of the proximal phalanx of the fourth toe with
absence of the prior head of the proximal phalanx related to the
fractures shown on 09/04/2019.

Old deformity of the fifth metatarsal compatible with old healed
fracture. Accessory navicular. Plantar and Achilles calcaneal spurs.
Atherosclerotic calcification. Dorsal midfoot spurring.
IMPRESSION: 1. Osteomyelitis of the distal phalanx third toe. Abnormal gas
tracking in the soft tissues of the proximal third toe.
2. Old deformities of the proximal phalanx fourth toe and of the
fifth metatarsal resulting from prior fractures.
3. Plantar and Achilles calcaneal spurs.
4. Atherosclerosis.

## 2023-02-18 ENCOUNTER — Other Ambulatory Visit: Payer: Self-pay | Admitting: Vascular Surgery

## 2023-02-18 ENCOUNTER — Other Ambulatory Visit: Payer: Self-pay | Admitting: Cardiovascular Disease

## 2023-02-22 DIAGNOSIS — E1151 Type 2 diabetes mellitus with diabetic peripheral angiopathy without gangrene: Secondary | ICD-10-CM | POA: Diagnosis not present

## 2023-02-22 DIAGNOSIS — K769 Liver disease, unspecified: Secondary | ICD-10-CM | POA: Diagnosis not present

## 2023-02-22 DIAGNOSIS — I700 Atherosclerosis of aorta: Secondary | ICD-10-CM | POA: Diagnosis not present

## 2023-02-22 DIAGNOSIS — Z89439 Acquired absence of unspecified foot: Secondary | ICD-10-CM | POA: Diagnosis not present

## 2023-02-22 DIAGNOSIS — E7800 Pure hypercholesterolemia, unspecified: Secondary | ICD-10-CM | POA: Diagnosis not present

## 2023-02-22 DIAGNOSIS — R195 Other fecal abnormalities: Secondary | ICD-10-CM | POA: Diagnosis not present

## 2023-02-25 ENCOUNTER — Ambulatory Visit (INDEPENDENT_AMBULATORY_CARE_PROVIDER_SITE_OTHER): Payer: Medicare Other | Admitting: Podiatry

## 2023-02-25 DIAGNOSIS — I739 Peripheral vascular disease, unspecified: Secondary | ICD-10-CM | POA: Diagnosis not present

## 2023-02-25 DIAGNOSIS — E1142 Type 2 diabetes mellitus with diabetic polyneuropathy: Secondary | ICD-10-CM | POA: Diagnosis not present

## 2023-02-25 DIAGNOSIS — L84 Corns and callosities: Secondary | ICD-10-CM

## 2023-02-25 DIAGNOSIS — L97521 Non-pressure chronic ulcer of other part of left foot limited to breakdown of skin: Secondary | ICD-10-CM

## 2023-02-25 IMAGING — DX DG CHEST 1V PORT
1 series · 1 of 1 positions shown · non-contrast
Comparison: Chest x-ray dated April 04, 2020

CLINICAL DATA: Decreased right-sided breath sounds

EXAM:
PORTABLE CHEST 1 VIEW

[chest]
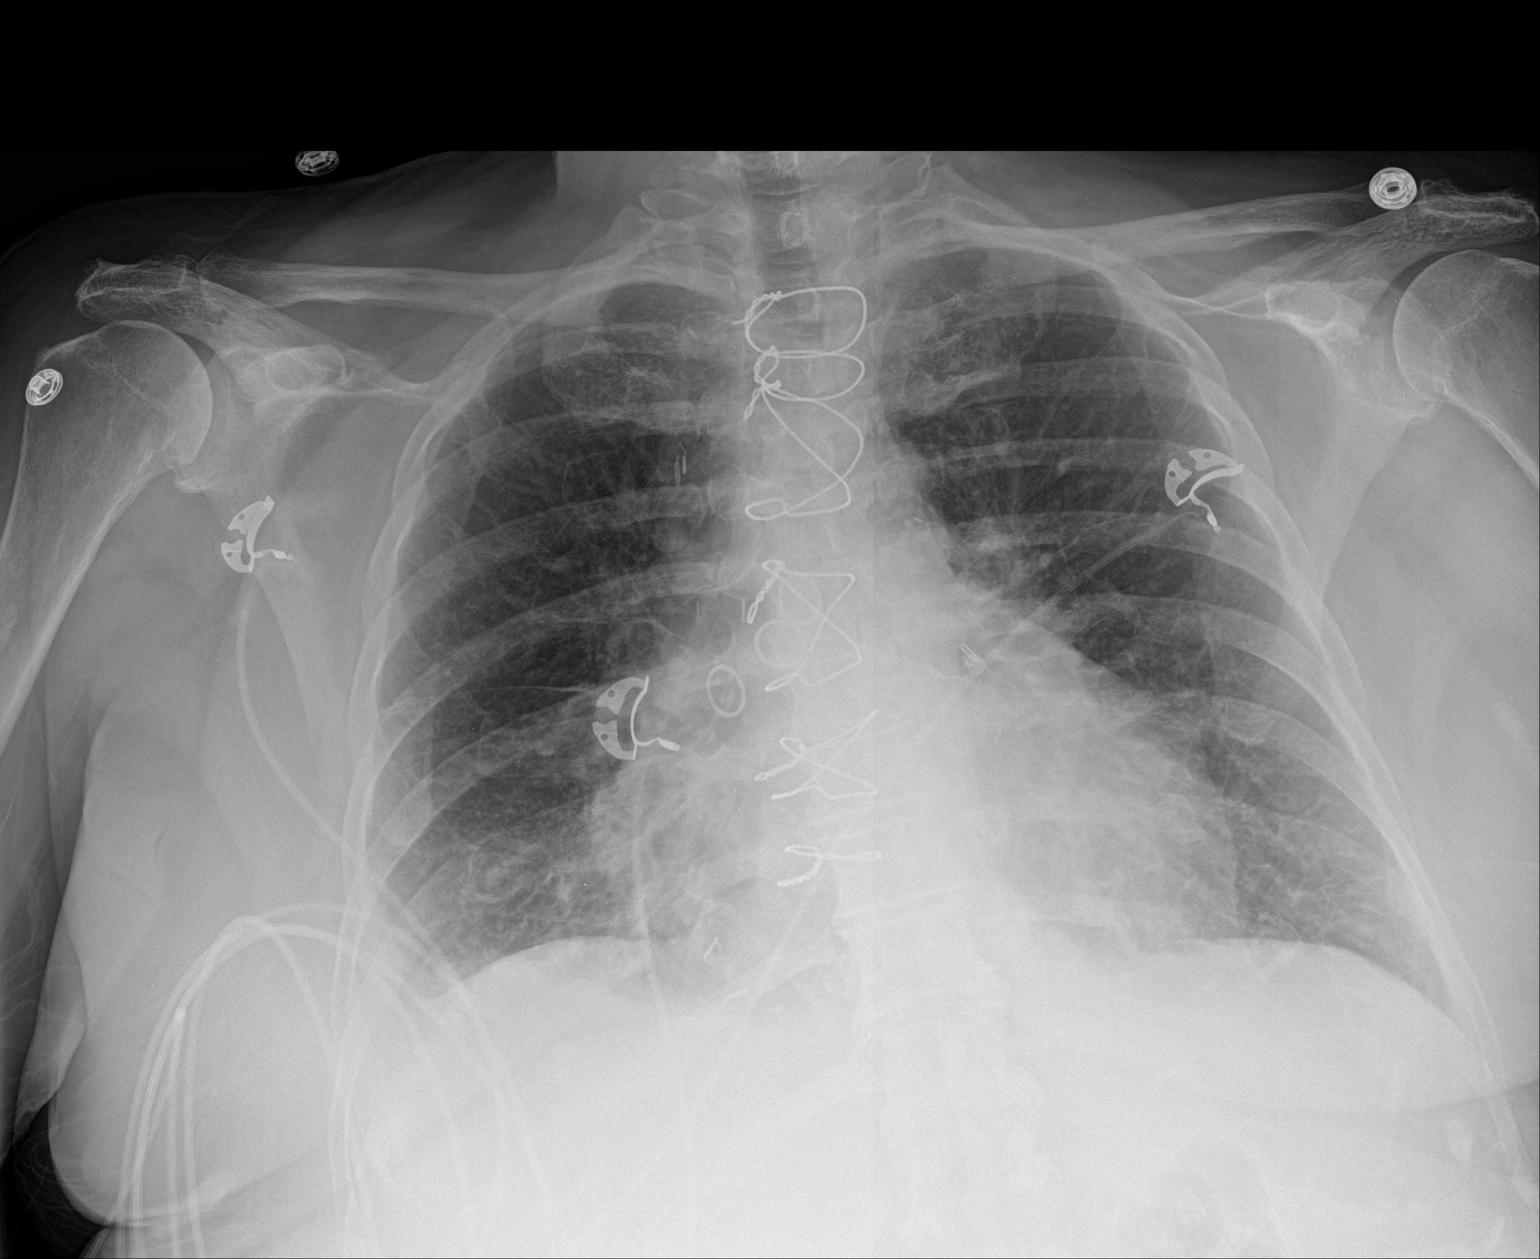

[1 of 1 positions shown; findings below may reference images not displayed]

FINDINGS: Cardiac and mediastinal contours are unchanged post median
sternotomy. Small right pleural effusion. Mild bilateral
interstitial opacities. No evidence of pneumothorax.
IMPRESSION: Small right pleural effusion.

Mild bilateral interstitial opacities, possibly due to pulmonary
edema.

## 2023-02-25 NOTE — Progress Notes (Signed)
Subjective: Chief Complaint  Patient presents with   Callouses    82 year old female presents the office today for callus on the right and left foot.  She states that she wore a different pair of slippers which were new all day when she took them off she noticed a spot on her left foot.  She has been using Santyl but she had already at home. No swelling or redness.  No fevers or chills.  Objective: AAO x3, NAD Status post bilateral transmetatarsal amputations.  On the right side callus formation is present upon debridement there is no underlying ulceration drainage or signs of infection.  However on the left foot on the medial aspect is hyperkeratotic tissue with dried blood.  Upon debridement of the callus formation there was superficial granular wound present without any probing, undermining or tunneling.  No surrounding erythema, ascending cellulitis.  There is no fluctuation, crepitation, malodor. No pain with calf compression, swelling, warmth, erythema  Assessment: Ulceration left foot, PAD  Plan: -All treatment options discussed with the patient including all alternatives, risks, complications.  -On the right foot debrided the calluses and complications of bleeding. -On the left foot sharply debrided the callus reveals superficial granular wound present.  Medically necessary wound debridement is performed.  Sharply debrided the callus formation to reveal underlying ulceration which measured approximate 1.5 x 0.2 x 0.1 cm without any probing, undermining or tunneling.  This appears to be almost a skin fissure type lesion along the area of the incision which has a callus at previously.  During the debridement no bleeding occurred.  Cleaned with saline.  Antibiotic ointment was applied followed by dressing.  She will continue with daily dressing changes, offloading. -She follows with [facility] -Monitor for any clinical signs or symptoms of infection and directed to call the office immediately  should any occur or go to the ER.  Return in about 2 weeks (around 03/11/2023) for left foot ulcer.  Vivi Barrack DPM

## 2023-03-11 ENCOUNTER — Other Ambulatory Visit (INDEPENDENT_AMBULATORY_CARE_PROVIDER_SITE_OTHER): Payer: Medicare Other

## 2023-03-11 DIAGNOSIS — Z794 Long term (current) use of insulin: Secondary | ICD-10-CM | POA: Diagnosis not present

## 2023-03-11 DIAGNOSIS — E1165 Type 2 diabetes mellitus with hyperglycemia: Secondary | ICD-10-CM | POA: Diagnosis not present

## 2023-03-11 LAB — HEMOGLOBIN A1C: Hgb A1c MFr Bld: 7.9 % — ABNORMAL HIGH (ref 4.6–6.5)

## 2023-03-11 LAB — BASIC METABOLIC PANEL
BUN: 25 mg/dL — ABNORMAL HIGH (ref 6–23)
CO2: 27 mEq/L (ref 19–32)
Calcium: 9.8 mg/dL (ref 8.4–10.5)
Chloride: 104 mEq/L (ref 96–112)
Creatinine, Ser: 1.21 mg/dL — ABNORMAL HIGH (ref 0.40–1.20)
GFR: 42.19 mL/min — ABNORMAL LOW (ref >60.00)
Glucose, Bld: 152 mg/dL — ABNORMAL HIGH (ref 70–99)
Potassium: 4.6 mEq/L (ref 3.5–5.1)
Sodium: 140 mEq/L (ref 135–145)

## 2023-03-14 ENCOUNTER — Ambulatory Visit (INDEPENDENT_AMBULATORY_CARE_PROVIDER_SITE_OTHER): Payer: Medicare Other | Admitting: Endocrinology

## 2023-03-14 ENCOUNTER — Ambulatory Visit (INDEPENDENT_AMBULATORY_CARE_PROVIDER_SITE_OTHER): Payer: Medicare Other | Admitting: Podiatry

## 2023-03-14 ENCOUNTER — Encounter: Payer: Self-pay | Admitting: Endocrinology

## 2023-03-14 ENCOUNTER — Encounter: Payer: Self-pay | Admitting: Podiatry

## 2023-03-14 VITALS — BP 118/76 | HR 76 | Ht 65.0 in | Wt 145.0 lb

## 2023-03-14 DIAGNOSIS — E1165 Type 2 diabetes mellitus with hyperglycemia: Secondary | ICD-10-CM

## 2023-03-14 DIAGNOSIS — Z794 Long term (current) use of insulin: Secondary | ICD-10-CM

## 2023-03-14 DIAGNOSIS — E1142 Type 2 diabetes mellitus with diabetic polyneuropathy: Secondary | ICD-10-CM

## 2023-03-14 DIAGNOSIS — I739 Peripheral vascular disease, unspecified: Secondary | ICD-10-CM

## 2023-03-14 DIAGNOSIS — L97521 Non-pressure chronic ulcer of other part of left foot limited to breakdown of skin: Secondary | ICD-10-CM | POA: Diagnosis not present

## 2023-03-14 DIAGNOSIS — Z89431 Acquired absence of right foot: Secondary | ICD-10-CM | POA: Diagnosis not present

## 2023-03-14 DIAGNOSIS — Z89432 Acquired absence of left foot: Secondary | ICD-10-CM | POA: Diagnosis not present

## 2023-03-14 MED ORDER — MUPIROCIN 2 % EX OINT: 1.0000 | TOPICAL_OINTMENT | Freq: Two times a day (BID) | CUTANEOUS | 2 refills | Status: DC

## 2023-03-14 NOTE — Progress Notes (Signed)
Patient ID: [name], female   DOB: Aug 17, 1941, 82 y.o.   MRN: [id number]          Reason for Appointment: Follow-up for endocrinology problems   History of Present Illness:          Date of diagnosis of type 2 diabetes mellitus: 1984       Background history:   She has been on metformin since about the time of her diagnosis and has been on other drugs also but has not had many changes in her regimen in the last several years She thinks her blood sugar used to be fairly well controlled At some point she was given Byetta for about a year and a half which improved her control but this was subsequently start because of it being too expensive for her.  She had no side effects with this  Recent history:   Her A1c is 7.9, was 7.5  Non-insulin hypoglycemic regimen: Prandin 2 mg twice a day metformin 1 g daily, Jardiance and evenings  Insulin: Humalog mix 8 units before breakfast  Current management, level of control, blood sugar patterns and problems identified:  She has restarted her Jardiance as directed but not taking it in the evening Her insulin doses the same as before and she takes it consistently before breakfast Most of her monitoring is either fasting or midday, mostly before lunch Unclear why her A1c is higher since her average blood sugar at home was only about 150 However has only 2 readings after dinner which are mildly increased Compared to before her fasting readings overall are higher also but variable Her weight is about the same She is trying to do some exercise bike at home She is very consistent with getting some protein at every meal  Side effects from medications have been: None  Hypoglycemia:   None       Blood Glucose data from One Touch meter download:   PRE-MEAL Fasting Midday Dinner Bedtime Overall  Glucose range: 95-212 74-222   74-222  Mean/median: 148 148   152   POST-MEAL PC Breakfast PC Lunch PC Dinner  Glucose range:   180, 188   Mean/median:       Prior   PRE-MEAL Fasting Lunch Dinner Bedtime Overall  Glucose range: 72-174   177, 230   Mean/median: 114    134   POST-MEAL PC Breakfast PC Lunch PC Dinner  Glucose range:  94-183   Mean/median:        Self-care: The diet that the patient has been following is: tries to limit sweets and fried food.     Meal times are:  Breakfast is at 8:30 AM lunch: 1 PM, dinner: 6:30 PM  Typical meal intake: Breakfast is yogurt/cereal with fruit or egg/toast or oatmeal Sometimes drinking boost  Lunch is usually soup with crackers, dinner is usually a low-fat meat with vegetables Snacks will be celery and ranch dressing               Dietician visit, most recent: 10/18                Weight history:  Wt Readings from Last 3 Encounters:  03/14/23 145 lb (65.8 kg)  12/25/22 143 lb (64.9 kg)  12/11/22 144 lb 6.4 oz (65.5 kg)    Glycemic control:   Lab Results  Component Value Date   HGBA1C 7.9 (H) 03/11/2023   HGBA1C 7.5 (H) 12/07/2022   HGBA1C 7.9 (H) 08/28/2022   Lab  Results  Component Value Date   MICROALBUR 19.1 (H) 08/28/2022   LDLCALC 50 10/14/2021   CREATININE 1.21 (H) 03/11/2023   Lab Results  Component Value Date   MICRALBCREAT 54.0 (H) 08/28/2022    Lab Results  Component Value Date   FRUCTOSAMINE 261 11/28/2021   FRUCTOSAMINE 275 09/29/2021   FRUCTOSAMINE 237 03/29/2021   Lab Results  Component Value Date   HGB 11.9 (L) 11/16/2022      Allergies as of 03/14/2023       Reactions   Versed [midazolam] Anxiety   Frantic, out of my mind, agitated    Augmentin [amoxicillin-pot Clavulanate] Other (See Comments)   Elevates potassium level   Bactrim [sulfamethoxazole-trimethoprim] Other (See Comments)   ^ K+( elevated)    Demerol [meperidine]    Delusional    Meperidine Hcl    Other reaction(s): delusional   Scopolamine    Delusional    Tramadol Other (See Comments)   Keeps awake        Medication List        Accurate as  of March 14, 2023  4:41 PM. If you have any questions, ask your nurse or doctor.          acetaminophen 500 MG tablet Commonly known as: TYLENOL Take 1 tablet (500 mg total) by mouth every 8 (eight) hours as needed for mild pain.   aspirin EC 81 MG tablet Take 81 mg by mouth at bedtime. Swallow whole.   atorvastatin 80 MG tablet Commonly known as: LIPITOR TAKE 1 TABLET BY MOUTH ONCE DAILY. MUST  KEEP  UPCOMING  APPOINTMENT  FOR  FUTURE  REFILLS   B-12 PO Take 1,000 mcg by mouth daily with supper.   brimonidine 0.2 % ophthalmic solution Commonly known as: ALPHAGAN Place 1 drop into the right eye 2 (two) times daily.   CINNAMON PO Take 2,400 mg by mouth in the morning, at noon, and at bedtime. 1200 mg each Breakfast, supper & bedtime.   [employer] SENIOR PROBIOTIC PO Take 1 capsule by mouth every evening.   hydrochlorothiazide 12.5 MG tablet Commonly known as: HYDRODIURIL Take 1 tablet by mouth once daily   Insulin Lispro Prot & Lispro (75-25) 100 UNIT/ML Kwikpen Commonly known as: HumaLOG Mix 75/25 KwikPen Inject 15 Units into the skin daily with breakfast. What changed:  how much to take how to take this when to take this additional instructions   Jardiance 25 MG Tabs tablet Generic drug: empagliflozin TAKE 1 TABLET BY MOUTH ONCE DAILY BEFORE BREAKFAST What changed:  how much to take when to take this additional instructions   Lagevrio 200 MG Caps capsule Generic drug: molnupiravir EUA SMARTSIG:4 Capsule(s) By Mouth Every 12 Hours   metFORMIN 1000 MG tablet Commonly known as: GLUCOPHAGE Take 1 tablet by mouth twice daily   metoprolol tartrate 25 MG tablet Commonly known as: LOPRESSOR TAKE 1 & 1/2 (ONE & ONE-HALF) TABLETS BY MOUTH TWICE DAILY   mupirocin ointment 2 % Commonly known as: BACTROBAN Apply 1 Application topically 2 (two) times daily. Started by: Vivi Barrack, DPM   OneTouch Delica Plus Lancet33G Misc Use to check blood sugars twice  daily. What changed: additional instructions   OneTouch Verio test strip Generic drug: glucose blood USE TO CHECK BLOOD SUGARS ONCE DAILY   pantoprazole 40 MG tablet Commonly known as: PROTONIX Take 1 tablet by mouth once daily What changed:  when to take this additional instructions   repaglinide 2 MG tablet Commonly known as:  PRANDIN Take 1 tab by breakfast and 1 tab before dinner   Systane Balance 0.6 % Soln Generic drug: Propylene Glycol Place 1 drop into the left eye 3 (three) times daily as needed (dry/irritated eyes).   telmisartan 80 MG tablet Commonly known as: MICARDIS Take 1 tablet by mouth once daily   Ultra-Thin II Mini Pen Needle 31G X 5 MM Misc Generic drug: Insulin Pen Needle USE AS DIRECTED   VITA-C PO   VITAMIN D3 GUMMIES PO Take 2 tablets by mouth at bedtime.   Vitamin K2 100 MCG Tabs Take 100 mcg by mouth at bedtime.   Xarelto 2.5 MG Tabs tablet Generic drug: rivaroxaban Take 1 tablet by mouth twice daily        Allergies:  Allergies  Allergen Reactions   Versed [Midazolam] Anxiety    Frantic, out of my mind, agitated    Augmentin [Amoxicillin-Pot Clavulanate] Other (See Comments)    Elevates potassium level   Bactrim [Sulfamethoxazole-Trimethoprim] Other (See Comments)    ^ K+( elevated)    Demerol [Meperidine]     Delusional    Meperidine Hcl     Other reaction(s): delusional   Scopolamine     Delusional    Tramadol Other (See Comments)    Keeps awake    Past Medical History:  Diagnosis Date   Anemia    after CABG in june 2021   Arthritis    Cancer (HCC)    removal from nose - MOSE procedure    Complication of anesthesia    VERSED- agitated, muscle spasms, "jerking" , frantic , (never had this occurence in the pas)    Coronary artery disease    Diabetes mellitus without complication (HCC)    Dysrhythmia    PVC's   GERD (gastroesophageal reflux disease)    Heart murmur    History of hiatal hernia    Hyperlipidemia     Hypertension 11/20/11   ECHO- EF>55% Borderline concentric left ventricular hypertrophy. There is a small calcified mass in the L:A near the LA appendage. No valvular masses seen with associated mitral annular calcification. LA Volume/ BSA27.4 ml/m2 No AS. Right ventricular systolic pressure is elevated at .   Myocardial infarction [facility])    June 2021   Neuromuscular disorder (HCC)    neuropathy in bilateral feet   Peripheral vascular disease (HCC)    Pneumonia    not hosp.    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  PTCAs with drug coated balloon Lt ant tibial artery and Lt popliteal artery  11/26/2019    Past Surgical History:  Procedure Laterality Date   ABDOMINAL AORTAGRAM  11/25/2019   ABDOMINAL AORTOGRAM W/LOWER EXTREMITY    ABDOMINAL AORTOGRAM N/A 06/15/2019   Procedure: ABDOMINAL AORTOGRAM;  Surgeon: Runell Gess, MD;  [facility]: [facility];  Service: Cardiovascular;  Laterality: N/A;   ABDOMINAL AORTOGRAM W/LOWER EXTREMITY N/A 11/25/2019   Procedure: ABDOMINAL AORTOGRAM W/LOWER EXTREMITY;  Surgeon: Iran Ouch, MD;  Location: [facility];  Service: Cardiovascular;  Laterality: N/A;  Lt leg   ABDOMINAL AORTOGRAM W/LOWER EXTREMITY N/A 08/01/2020   Procedure: ABDOMINAL AORTOGRAM W/LOWER EXTREMITY;  Surgeon: Runell Gess, MD;  [facility]: [facility];  Service: Cardiovascular;  Laterality: N/A;   ABDOMINAL AORTOGRAM W/LOWER EXTREMITY N/A 04/28/2021   Procedure: ABDOMINAL AORTOGRAM W/LOWER EXTREMITY;  Surgeon: Sherren Kerns, MD;  [facility]: [facility];  Service: Cardiovascular;  Laterality: N/A;   ABDOMINAL AORTOGRAM W/LOWER EXTREMITY  N/A 10/13/2021   Procedure: ABDOMINAL AORTOGRAM W/LOWER EXTREMITY;  Surgeon: Leonie Douglas, MD;  [facility]: [facility];  Service: Cardiovascular;  Laterality: N/A;   ABDOMINAL AORTOGRAM W/LOWER EXTREMITY N/A 11/09/2022   Procedure: ABDOMINAL AORTOGRAM W/LOWER EXTREMITY;  Surgeon:  Leonie Douglas, MD;  [facility]: [facility];  Service: Cardiovascular;  Laterality: N/A;   ABDOMINAL AORTOGRAM W/LOWER EXTREMITY N/A 11/16/2022   Procedure: ABDOMINAL AORTOGRAM W/LOWER EXTREMITY;  Surgeon: Leonie Douglas, MD;  [facility];  Service: Cardiovascular;  Laterality: N/A;   AMPUTATION TOE Left 05/03/2021   Procedure: AMPUTATION OF THIRD LEFT  TOE;  Surgeon: Leonie Douglas, MD;  Location[facility] OR;  Service: Vascular;  Laterality: Left;   APPENDECTOMY     APPLICATION OF WOUND VAC Right 07/21/2019   Procedure: Application Of Prevena Wound Vac Right Groin;  Surgeon: Nada Libman, MD;  [facility];  Service: Vascular;  Laterality: Right;   CARPAL TUNNEL RELEASE Left    CARPAL TUNNEL RELEASE Right    CESAREAN SECTION     x 2   CHOLECYSTECTOMY N/A 10/12/2022   Procedure: LAPAROSCOPIC PARTIAL FENESTRATING CHOLECYSTECTOMY;  Surgeon: Fritzi Mandes, MD;  [facility];  Service: General;  Laterality: N/A;   COLONOSCOPY     CORONARY ARTERY BYPASS GRAFT N/A 03/24/2020   Procedure: CORONARY ARTERY BYPASS GRAFTING (CABG) using LIMA to LAD (m); RIMA to RAMUS; Endoscopic Right Greater Saphenous Vein: SVG to Diag1; SVG to PLB (right); and SVG to PL (left).;  Surgeon: Linden Dolin, MD;  [facility];  Service: Open Heart Surgery;  Laterality: N/A;  BILATERAL IMA   ENDARTERECTOMY FEMORAL Right 07/21/2019   Procedure: RIGHT ENDARTERECTOMY FEMORAL WITH PATCH ANGIOPLASTY;  Surgeon: Nada Libman, MD;  [facility];  Service: Vascular;  Laterality: Right;   ENDARTERECTOMY FEMORAL Right 08/16/2020   Procedure: RIGHT FEMORAL ENDARTERECTOMY;  Surgeon: Maeola Harman, MD;  Location[facility];  Service: Vascular;  Laterality: Right;   ENDOVEIN HARVEST OF GREATER SAPHENOUS VEIN Right 03/24/2020   Procedure: ENDOVEIN HARVEST OF GREATER SAPHENOUS VEIN;  Surgeon: Linden Dolin, MD;  [facility];  Service: Open Heart Surgery;  Laterality: Right;    EYE SURGERY     cataract removal bilaterally   FEMORAL-POPLITEAL BYPASS GRAFT Right 08/16/2020   Procedure: BYPASS GRAFT FEMORAL-POPLITEAL ARTERY;  Surgeon: Maeola Harman, MD;  Location: [facility] OR;  Service: Vascular;  Laterality: Right;   FEMORAL-POPLITEAL BYPASS GRAFT Left 05/03/2021   Procedure: LEFT FEMORAL TO BELOW KNEE POPLITEAL ARTERY BYPASS GRAFTING WITH 6MMX80 PTFE GRAFT;  Surgeon: Leonie Douglas, MD;  [facility];  Service: Vascular;  Laterality: Left;   LEFT HEART CATH AND CORONARY ANGIOGRAPHY N/A 03/22/2020   Procedure: LEFT HEART CATH AND CORONARY ANGIOGRAPHY;  Surgeon: Swaziland, Peter M, MD;  Location: [facility] INVASIVE CV LAB;  Service: Cardiovascular;  Laterality: N/A;   LOWER EXTREMITY ANGIOGRAPHY Bilateral 06/15/2019   Procedure: Lower Extremity Angiography;  Surgeon: Runell Gess, MD;  Location: [facility] INVASIVE CV LAB;  Service: Cardiovascular;  Laterality: Bilateral;   LOWER EXTREMITY ANGIOGRAPHY Right 08/03/2019   Procedure: LOWER EXTREMITY ANGIOGRAPHY;  Surgeon: Runell Gess, MD;  [facility];  Service: Cardiovascular;  Laterality: Right;   LOWER EXTREMITY ANGIOGRAPHY N/A 09/07/2021   Procedure: LOWER EXTREMITY ANGIOGRAPHY;  Surgeon: Cephus Shelling, MD;  [facility];  Service: Cardiovascular;  Laterality: N/A;   PATCH ANGIOPLASTY Right 07/21/2019   Procedure: Patch  Angioplasty Right Femoral Artery;  Surgeon: Nada Libman, MD;  Location: [facility] OR;  Service: Vascular;  Laterality: Right;   PERIPHERAL INTRAVASCULAR LITHOTRIPSY  11/25/2019   Procedure: INTRAVASCULAR LITHOTRIPSY;  Surgeon: Iran Ouch, MD;  [facility];  Service: Cardiovascular;;  LT. SFA   PERIPHERAL VASCULAR ATHERECTOMY  08/03/2019   Procedure: PERIPHERAL VASCULAR ATHERECTOMY;  Surgeon: Runell Gess, MD;  Location: [facility] INVASIVE CV LAB;  Service: Cardiovascular;;  right SFA, right TP trunk   PERIPHERAL VASCULAR ATHERECTOMY   11/25/2019   Procedure: PERIPHERAL VASCULAR ATHERECTOMY;  Surgeon: Iran Ouch, MD;  [facility];  Service: Cardiovascular;;  Lt.  POPLITEAL and AT   PERIPHERAL VASCULAR BALLOON ANGIOPLASTY Left 06/15/2019   Procedure: PERIPHERAL VASCULAR BALLOON ANGIOPLASTY;  Surgeon: Runell Gess, MD;  [facility];  Service: Cardiovascular;  Laterality: Left;  SFA UNSUCCESSFUL UNABLE TO CROSS LESION   PERIPHERAL VASCULAR BALLOON ANGIOPLASTY  08/03/2019   Procedure: PERIPHERAL VASCULAR BALLOON ANGIOPLASTY;  Surgeon: Runell Gess, MD;  [facility];  Service: Cardiovascular;;  right SFA, Right TP trunk   PERIPHERAL VASCULAR BALLOON ANGIOPLASTY  09/07/2021   Procedure: PERIPHERAL VASCULAR BALLOON ANGIOPLASTY;  Surgeon: Cephus Shelling, MD;  [facility];  Service: Cardiovascular;;   PERIPHERAL VASCULAR BALLOON ANGIOPLASTY Right 10/13/2021   Procedure: PERIPHERAL VASCULAR BALLOON ANGIOPLASTY;  Surgeon: Leonie Douglas, MD;  [facility];  Service: Cardiovascular;  Laterality: Right;   PERIPHERAL VASCULAR BALLOON ANGIOPLASTY  11/09/2022   Procedure: PERIPHERAL VASCULAR BALLOON ANGIOPLASTY;  Surgeon: Leonie Douglas, MD;  [facility];  Service: Cardiovascular;;  fem-pop bypass and AT   TEE WITHOUT CARDIOVERSION N/A 03/24/2020   Procedure: TRANSESOPHAGEAL ECHOCARDIOGRAM (TEE);  Surgeon: Linden Dolin, MD;  Location: [facility] OR;  Service: Open Heart Surgery;  Laterality: N/A;   TONSILLECTOMY     and adenoidectomy   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/2019   Procedure: TRANSMETATARSAL AMPUTATION;  Surgeon: Vivi Barrack, DPM;  [facility];  Service: Podiatry;  Laterality: Right;   TRANSMETATARSAL AMPUTATION Left 05/11/2021   Procedure: LEFT TRANSMETATARSAL AMPUTATION;  Surgeon: Maeola Harman, MD;  Location: [facility] OR;  Service: Vascular;  Laterality: Left;   TUBAL LIGATION       Family History  Problem Relation Age of Onset   Cancer - Prostate Father    Cancer - Colon Father    Stroke Mother    Hypertension Mother    Hyperlipidemia Mother    Melanoma Brother     Social History:  reports that she quit smoking about 50 years ago. Her smoking use included cigarettes. She has never used smokeless tobacco. She reports that she does not drink alcohol and does not use drugs.   Review of Systems   Lipid history: Hypercholesterolemia treated with 80 mg Lipitor    Lab Results  Component Value Date   CHOL 97 10/14/2021   HDL 29 (L) 10/14/2021   LDLCALC 50 10/14/2021   TRIG 89 10/14/2021   CHOLHDL 3.3 10/14/2021           Hypertension: Present for several years, currently treated with  Micardis 80 mg, metoprolol, and HCTZ, also followed by cardiologist This is monitored at home   BP Readings from Last 3 Encounters:  03/14/23 118/76  12/25/22 (!) 142/58  12/11/22 (!) 148/72   Renal function history:  Lab Results  Component Value Date   CREATININE 1.21 (H) 03/11/2023  CREATININE 1.03 12/07/2022   CREATININE 1.10 (H) 11/16/2022     She has had significant PAD followed by vascular cardiologist   LABS:  Lab on 03/11/2023  Component Date Value Ref Range Status   Sodium 03/11/2023 140  135 - 145 mEq/L Final   Potassium 03/11/2023 4.6  3.5 - 5.1 mEq/L Final   Chloride 03/11/2023 104  96 - 112 mEq/L Final   CO2 03/11/2023 27  19 - 32 mEq/L Final   Glucose, Bld 03/11/2023 152 (H)  70 - 99 mg/dL Final   BUN 69/62/9528 25 (H)  6 - 23 mg/dL Final   Creatinine, Ser 03/11/2023 1.21 (H)  0.40 - 1.20 mg/dL Final   GFR 41/32/4401 42.19 (L)  >60.00 mL/min Final   Calculated using the CKD-EPI Creatinine Equation (2021)   Calcium 03/11/2023 9.8  8.4 - 10.5 mg/dL Final   Hgb U2V MFr Bld 03/11/2023 7.9 (H)  4.6 - 6.5 % Final   Glycemic Control Guidelines for People with Diabetes:Non Diabetic:  <6%Goal of Therapy: <7%Additional Action Suggested:  >8%      Physical Examination:  BP 118/76 (BP Location: Left Arm, Patient Position: Sitting, Cuff Size: Small)   Pulse 76   Ht 5\' 5"  (1.651 m)   Wt 145 lb (65.8 kg)   SpO2 99%   BMI 24.13 kg/m       ASSESSMENT:  Diabetes type 2 non-insulin-dependent  See history of present illness for detailed discussion of current diabetes management, blood sugar patterns and problems identified  She is on premixed insulin in the morning, metformin, Jardiance and Prandin 2 mg at breakfast and dinner  Her A1c is still relatively high at 7.9 although still may be adequate for her age  Blood sugars from monitor download were reviewed  Likely has high readings after dinner and before breakfast compared to the last visit but again monitoring is inadequate She is usually doing very well with her diet  Likely has some progression of her diabetes with some increased insulin deficiency Explained to her the various sections of different medications that she is taking and the type of insulin that she is taking Timing of insulin was discussed Also discussed getting balanced meals with not excessive carbohydrate  RENAL function: Minimally altered and will not adjust her Jardiance since her blood pressure is not low  PLAN:    Discussed that if we can improve her diabetes control simply we should do that to prevent further complications  No change in the premixed insulin in the morning May consider adjusting the type of insulin as needed based on blood sugar patterns on the next visit  Changes: Stop repaglinide at dinnertime Take Jardiance at breakfast daily Start 4 units of insulin before supper daily More blood sugar monitoring after dinner Call if consistently over target that was discussed  Will again consider using CGM on the next visit if needed better idea of her blood sugar patterns  Total visit time for evaluation and management and counseling = 30 minutes  Patient Instructions  Check  blood sugars on waking up 3-4 days a week  Also check blood sugars about 2 hours after meals and do this after different meals by rotation  Recommended blood sugar levels on waking up are 90-130 and about 2 hours after meal is 130-180  Please bring your blood sugar monitor to each visit, thank you  Stop repaglinide at dinnertime Now  Jardiance at breakfast daily Start 4 units of insulin before supper daily  Reather Littler 03/14/2023, 4:41 PM   Note: This office note was prepared with Dragon voice recognition system technology. Any transcriptional errors that result from this process are unintentional.

## 2023-03-14 NOTE — Patient Instructions (Signed)
Check blood sugars on waking up 3-4 days a week  Also check blood sugars about 2 hours after meals and do this after different meals by rotation  Recommended blood sugar levels on waking up are 90-130 and about 2 hours after meal is 130-180  Please bring your blood sugar monitor to each visit, thank you  Stop repaglinide at dinnertime Now  Jardiance at breakfast daily Start 4 units of insulin before supper daily

## 2023-03-14 NOTE — Progress Notes (Signed)
Subjective: Chief Complaint  Patient presents with   Foot Ulcer    Pt stated that it is doing better     82 year old female presents the office today for follow-up evaluation of a wound on her left foot.  She been keeping mupirocin ointment dressings on this but it is expired.  She thinks the wound is doing better.  No drainage or pus.  No fevers or chills.  Objective: AAO x3, NAD Status post bilateral transmetatarsal amputations.  On the right distal medial aspect is hyperkeratotic lesion without any underlying ulceration drainage or any signs of infection.  On the left foot along the area of the skin breakdown there is still superficial wound present today measuring 0.3 x 0.1 x 0.1 cm without any probing, undermining or tunneling.  Appears to be almost like a skin fissure.  There is no surrounding erythema, ascending cellulitis.  There is no fluctuation, crepitation, malodor. Overall, improving.  No pain with calf compression, swelling, warmth, erythema   Assessment: Ulceration left foot, PAD  Plan: -All treatment options discussed with the patient including all alternatives, risks, complications.  -On the right foot debrided the calluses and complications of bleeding. -On the left foot sharply debrided the callus reveals superficial granular wound present.  Medically necessary wound debridement is performed.  Sharply debrided the callus formation to reveal underlying ulceration which measured approximate 0.3 x 0.2 x 0.1 cm without any probing, undermining or tunneling.  Not able to measure the wound prior to debridement as it is mostly covered with callus.  Debrided down to healthy, granular tissue.  Would continue with mupirocin ointment dressing changes daily.  Refill today. -She follows with [facility] -Diabetic shoes/inserts with toe filler were dispensed they are fitting well.  Oral intermittent break-in instructions provided to the patient.  . -Monitor for any clinical signs or symptoms of  infection and directed to call the office immediately should any occur or go to the ER.  Return in about 4 weeks (around 04/11/2023).  Vivi Barrack DPM

## 2023-03-19 ENCOUNTER — Other Ambulatory Visit: Payer: Self-pay | Admitting: Endocrinology

## 2023-03-25 DIAGNOSIS — C221 Intrahepatic bile duct carcinoma: Secondary | ICD-10-CM

## 2023-03-25 HISTORY — DX: Intrahepatic bile duct carcinoma: C22.1

## 2023-04-02 ENCOUNTER — Encounter (HOSPITAL_COMMUNITY): Payer: Medicare Other

## 2023-04-02 ENCOUNTER — Ambulatory Visit: Payer: Medicare Other | Admitting: Vascular Surgery

## 2023-04-04 ENCOUNTER — Inpatient Hospital Stay (HOSPITAL_COMMUNITY)
Admission: EM | Admit: 2023-04-04 | Discharge: 2023-04-09 | DRG: 435 | Disposition: A | Discharge status: Home / Self Care | Payer: Medicare Other | Attending: Internal Medicine | Admitting: Internal Medicine

## 2023-04-04 ENCOUNTER — Emergency Department (HOSPITAL_COMMUNITY): Payer: Medicare Other

## 2023-04-04 ENCOUNTER — Encounter (HOSPITAL_COMMUNITY): Payer: Self-pay | Admitting: Family Medicine

## 2023-04-04 ENCOUNTER — Other Ambulatory Visit: Payer: Self-pay

## 2023-04-04 DIAGNOSIS — Z7901 Long term (current) use of anticoagulants: Secondary | ICD-10-CM

## 2023-04-04 DIAGNOSIS — R932 Abnormal findings on diagnostic imaging of liver and biliary tract: Secondary | ICD-10-CM | POA: Diagnosis not present

## 2023-04-04 DIAGNOSIS — E872 Acidosis, unspecified: Secondary | ICD-10-CM | POA: Diagnosis not present

## 2023-04-04 DIAGNOSIS — Z888 Allergy status to other drugs, medicaments and biological substances status: Secondary | ICD-10-CM

## 2023-04-04 DIAGNOSIS — Z8249 Family history of ischemic heart disease and other diseases of the circulatory system: Secondary | ICD-10-CM

## 2023-04-04 DIAGNOSIS — E119 Type 2 diabetes mellitus without complications: Secondary | ICD-10-CM

## 2023-04-04 DIAGNOSIS — E1140 Type 2 diabetes mellitus with diabetic neuropathy, unspecified: Secondary | ICD-10-CM | POA: Diagnosis not present

## 2023-04-04 DIAGNOSIS — N179 Acute kidney failure, unspecified: Secondary | ICD-10-CM | POA: Diagnosis present

## 2023-04-04 DIAGNOSIS — Z823 Family history of stroke: Secondary | ICD-10-CM

## 2023-04-04 DIAGNOSIS — Z885 Allergy status to narcotic agent status: Secondary | ICD-10-CM

## 2023-04-04 DIAGNOSIS — C220 Liver cell carcinoma: Secondary | ICD-10-CM | POA: Diagnosis not present

## 2023-04-04 DIAGNOSIS — Z85828 Personal history of other malignant neoplasm of skin: Secondary | ICD-10-CM

## 2023-04-04 DIAGNOSIS — Z800 Family history of malignant neoplasm of digestive organs: Secondary | ICD-10-CM

## 2023-04-04 DIAGNOSIS — I2510 Atherosclerotic heart disease of native coronary artery without angina pectoris: Secondary | ICD-10-CM | POA: Diagnosis not present

## 2023-04-04 DIAGNOSIS — D376 Neoplasm of uncertain behavior of liver, gallbladder and bile ducts: Secondary | ICD-10-CM | POA: Diagnosis not present

## 2023-04-04 DIAGNOSIS — Z79899 Other long term (current) drug therapy: Secondary | ICD-10-CM | POA: Diagnosis not present

## 2023-04-04 DIAGNOSIS — E876 Hypokalemia: Secondary | ICD-10-CM | POA: Diagnosis present

## 2023-04-04 DIAGNOSIS — R7401 Elevation of levels of liver transaminase levels: Secondary | ICD-10-CM | POA: Diagnosis present

## 2023-04-04 DIAGNOSIS — E1142 Type 2 diabetes mellitus with diabetic polyneuropathy: Secondary | ICD-10-CM | POA: Diagnosis not present

## 2023-04-04 DIAGNOSIS — K831 Obstruction of bile duct: Secondary | ICD-10-CM | POA: Diagnosis present

## 2023-04-04 DIAGNOSIS — E1151 Type 2 diabetes mellitus with diabetic peripheral angiopathy without gangrene: Secondary | ICD-10-CM | POA: Diagnosis not present

## 2023-04-04 DIAGNOSIS — R630 Anorexia: Secondary | ICD-10-CM | POA: Diagnosis present

## 2023-04-04 DIAGNOSIS — K311 Adult hypertrophic pyloric stenosis: Secondary | ICD-10-CM | POA: Diagnosis not present

## 2023-04-04 DIAGNOSIS — E7800 Pure hypercholesterolemia, unspecified: Secondary | ICD-10-CM | POA: Diagnosis not present

## 2023-04-04 DIAGNOSIS — N289 Disorder of kidney and ureter, unspecified: Secondary | ICD-10-CM | POA: Diagnosis not present

## 2023-04-04 DIAGNOSIS — N1831 Chronic kidney disease, stage 3a: Secondary | ICD-10-CM

## 2023-04-04 DIAGNOSIS — Z7984 Long term (current) use of oral hypoglycemic drugs: Secondary | ICD-10-CM

## 2023-04-04 DIAGNOSIS — Z87891 Personal history of nicotine dependence: Secondary | ICD-10-CM | POA: Diagnosis not present

## 2023-04-04 DIAGNOSIS — K769 Liver disease, unspecified: Secondary | ICD-10-CM | POA: Diagnosis not present

## 2023-04-04 DIAGNOSIS — Z951 Presence of aortocoronary bypass graft: Secondary | ICD-10-CM | POA: Diagnosis not present

## 2023-04-04 DIAGNOSIS — E1165 Type 2 diabetes mellitus with hyperglycemia: Secondary | ICD-10-CM | POA: Diagnosis present

## 2023-04-04 DIAGNOSIS — K7689 Other specified diseases of liver: Secondary | ICD-10-CM | POA: Diagnosis not present

## 2023-04-04 DIAGNOSIS — D509 Iron deficiency anemia, unspecified: Secondary | ICD-10-CM | POA: Diagnosis not present

## 2023-04-04 DIAGNOSIS — Z9049 Acquired absence of other specified parts of digestive tract: Secondary | ICD-10-CM | POA: Diagnosis not present

## 2023-04-04 DIAGNOSIS — I252 Old myocardial infarction: Secondary | ICD-10-CM

## 2023-04-04 DIAGNOSIS — C23 Malignant neoplasm of gallbladder: Principal | ICD-10-CM | POA: Diagnosis present

## 2023-04-04 DIAGNOSIS — R82998 Other abnormal findings in urine: Secondary | ICD-10-CM | POA: Diagnosis not present

## 2023-04-04 DIAGNOSIS — R17 Unspecified jaundice: Secondary | ICD-10-CM | POA: Diagnosis not present

## 2023-04-04 DIAGNOSIS — I779 Disorder of arteries and arterioles, unspecified: Secondary | ICD-10-CM | POA: Diagnosis present

## 2023-04-04 DIAGNOSIS — D649 Anemia, unspecified: Secondary | ICD-10-CM | POA: Diagnosis not present

## 2023-04-04 DIAGNOSIS — K8020 Calculus of gallbladder without cholecystitis without obstruction: Secondary | ICD-10-CM | POA: Diagnosis not present

## 2023-04-04 DIAGNOSIS — R682 Dry mouth, unspecified: Secondary | ICD-10-CM | POA: Diagnosis not present

## 2023-04-04 DIAGNOSIS — Z882 Allergy status to sulfonamides status: Secondary | ICD-10-CM | POA: Diagnosis not present

## 2023-04-04 DIAGNOSIS — M1990 Unspecified osteoarthritis, unspecified site: Secondary | ICD-10-CM | POA: Diagnosis present

## 2023-04-04 DIAGNOSIS — R7989 Other specified abnormal findings of blood chemistry: Principal | ICD-10-CM

## 2023-04-04 DIAGNOSIS — I739 Peripheral vascular disease, unspecified: Secondary | ICD-10-CM | POA: Diagnosis not present

## 2023-04-04 DIAGNOSIS — Z794 Long term (current) use of insulin: Secondary | ICD-10-CM | POA: Diagnosis not present

## 2023-04-04 DIAGNOSIS — Z808 Family history of malignant neoplasm of other organs or systems: Secondary | ICD-10-CM

## 2023-04-04 DIAGNOSIS — K529 Noninfective gastroenteritis and colitis, unspecified: Secondary | ICD-10-CM | POA: Diagnosis present

## 2023-04-04 DIAGNOSIS — I129 Hypertensive chronic kidney disease with stage 1 through stage 4 chronic kidney disease, or unspecified chronic kidney disease: Secondary | ICD-10-CM | POA: Diagnosis present

## 2023-04-04 DIAGNOSIS — E1122 Type 2 diabetes mellitus with diabetic chronic kidney disease: Secondary | ICD-10-CM | POA: Diagnosis present

## 2023-04-04 DIAGNOSIS — Z7982 Long term (current) use of aspirin: Secondary | ICD-10-CM

## 2023-04-04 DIAGNOSIS — E785 Hyperlipidemia, unspecified: Secondary | ICD-10-CM | POA: Diagnosis present

## 2023-04-04 DIAGNOSIS — R935 Abnormal findings on diagnostic imaging of other abdominal regions, including retroperitoneum: Secondary | ICD-10-CM | POA: Diagnosis not present

## 2023-04-04 DIAGNOSIS — R140 Abdominal distension (gaseous): Secondary | ICD-10-CM | POA: Diagnosis present

## 2023-04-04 DIAGNOSIS — Z83438 Family history of other disorder of lipoprotein metabolism and other lipidemia: Secondary | ICD-10-CM

## 2023-04-04 DIAGNOSIS — N1832 Chronic kidney disease, stage 3b: Secondary | ICD-10-CM | POA: Diagnosis present

## 2023-04-04 DIAGNOSIS — N200 Calculus of kidney: Secondary | ICD-10-CM | POA: Diagnosis not present

## 2023-04-04 DIAGNOSIS — K219 Gastro-esophageal reflux disease without esophagitis: Secondary | ICD-10-CM | POA: Diagnosis present

## 2023-04-04 DIAGNOSIS — Z89422 Acquired absence of other left toe(s): Secondary | ICD-10-CM

## 2023-04-04 LAB — COMPREHENSIVE METABOLIC PANEL
ALT: 137 U/L — ABNORMAL HIGH (ref 0–44)
AST: 185 U/L — ABNORMAL HIGH (ref 15–41)
Albumin: 3 g/dL — ABNORMAL LOW (ref 3.5–5.0)
Alkaline Phosphatase: 646 U/L — ABNORMAL HIGH (ref 38–126)
Anion gap: 15 (ref 5–15)
BUN: 27 mg/dL — ABNORMAL HIGH (ref 8–23)
CO2: 18 mmol/L — ABNORMAL LOW (ref 22–32)
Calcium: 9.1 mg/dL (ref 8.9–10.3)
Chloride: 104 mmol/L (ref 98–111)
Creatinine, Ser: 1.87 mg/dL — ABNORMAL HIGH (ref 0.44–1.00)
GFR, Estimated: 27 mL/min — ABNORMAL LOW (ref >60)
Glucose, Bld: 159 mg/dL — ABNORMAL HIGH (ref 70–99)
Potassium: 3.2 mmol/L — ABNORMAL LOW (ref 3.5–5.1)
Sodium: 137 mmol/L (ref 135–145)
Total Bilirubin: 13.1 mg/dL — ABNORMAL HIGH (ref 0.3–1.2)
Total Protein: 7.1 g/dL (ref 6.5–8.1)

## 2023-04-04 LAB — CBC WITH DIFFERENTIAL/PLATELET
Abs Immature Granulocytes: 0.04 10*3/uL (ref 0.00–0.07)
Basophils Absolute: 0.1 10*3/uL (ref 0.0–0.1)
Basophils Relative: 1 %
Eosinophils Absolute: 0.1 10*3/uL (ref 0.0–0.5)
Eosinophils Relative: 1 %
HCT: 34.9 % — ABNORMAL LOW (ref 36.0–46.0)
Hemoglobin: 11.3 g/dL — ABNORMAL LOW (ref 12.0–15.0)
Immature Granulocytes: 0 %
Lymphocytes Relative: 15 %
Lymphs Abs: 1.6 10*3/uL (ref 0.7–4.0)
MCH: 28 pg (ref 26.0–34.0)
MCHC: 32.4 g/dL (ref 30.0–36.0)
MCV: 86.6 fL (ref 80.0–100.0)
Monocytes Absolute: 1.3 10*3/uL — ABNORMAL HIGH (ref 0.1–1.0)
Monocytes Relative: 12 %
Neutro Abs: 7.8 10*3/uL — ABNORMAL HIGH (ref 1.7–7.7)
Neutrophils Relative %: 71 %
Platelets: 210 10*3/uL (ref 150–400)
RBC: 4.03 MIL/uL (ref 3.87–5.11)
RDW: 17.6 % — ABNORMAL HIGH (ref 11.5–15.5)
WBC: 11 10*3/uL — ABNORMAL HIGH (ref 4.0–10.5)
nRBC: 0 % (ref 0.0–0.2)

## 2023-04-04 LAB — URINALYSIS, ROUTINE W REFLEX MICROSCOPIC
Bilirubin Urine: NEGATIVE
Glucose, UA: >=500 mg/dL — AB
Ketones, ur: NEGATIVE mg/dL
Leukocytes,Ua: NEGATIVE
Nitrite: NEGATIVE
Protein, ur: 30 mg/dL — AB
Specific Gravity, Urine: 1.007 (ref 1.005–1.030)
pH: 5 (ref 5.0–8.0)

## 2023-04-04 MED ORDER — SODIUM CHLORIDE 0.9 % IV BOLUS
1000.0000 mL | Freq: Once | INTRAVENOUS | Status: AC
  Administered 2023-04-04: 1000 mL via INTRAVENOUS

## 2023-04-04 NOTE — ED Provider Notes (Incomplete)
[place] EMERGENCY DEPARTMENT AT [facility] Provider Note   CSN: [id number] Arrival date & time: 04/04/23  2124     History {Add pertinent medical, surgical, social history, OB history to HPI:1} Chief Complaint  Patient presents with  . Jaundice    [name] is a 82 y.o. female.  Patient presents to the emergency department complaining of jaundiced skin, yellowing eyes which were noticed yesterday when the patient went to church.  She also complains of increased fatigue over the past week and darkened urine.  The patient states that she went to her primary care physician for the same earlier today and was told that her liver function was abnormal and that she appeared to be dehydrated.  It was recommended she come to the emergency department for further evaluation.  The patient states that she had a fenestrated laparoscopic cholecystectomy in January of this year.  Patient currently denies abdominal pain, nausea, vomiting, chest pain, shortness of breath, weakness, urinary symptoms.  Past medical history significant for hypertension, type II DM, PAD, CAD, NSTEMI, transmetatarsal amputation of foot (left), family history of malignant neoplasm of digestive organ  HPI     Home Medications Prior to Admission medications   Medication Sig Start Date End Date Taking? Authorizing Provider  acetaminophen (TYLENOL) 500 MG tablet Take 1 tablet (500 mg total) by mouth every 8 (eight) hours as needed for mild pain. 10/12/22   Fritzi Mandes, MD  Ascorbic Acid (VITA-C PO)  03/10/23   [provider]  aspirin EC 81 MG tablet Take 81 mg by mouth at bedtime. Swallow whole.    [provider]  atorvastatin (LIPITOR) 80 MG tablet TAKE 1 TABLET BY MOUTH ONCE DAILY. MUST  KEEP  UPCOMING  APPOINTMENT  FOR  FUTURE  REFILLS 02/19/23   Runell Gess, MD  brimonidine [facility]) 0.2 % ophthalmic solution Place 1 drop into the right eye 2 (two) times daily. 01/19/21   [provider]  Cholecalciferol (VITAMIN D3 GUMMIES PO) Take 2 tablets by mouth at bedtime.    [provider]  CINNAMON PO Take 2,400 mg by mouth in the morning, at noon, and at bedtime. 1200 mg each Breakfast, supper & bedtime.    [provider]  Cyanocobalamin (B-12 PO) Take 1,000 mcg by mouth daily with supper.    [provider]  hydrochlorothiazide (HYDRODIURIL) 12.5 MG tablet Take 1 tablet by mouth once daily 03/19/23   Reather Littler, MD  Insulin Lispro Prot & Lispro (HUMALOG MIX 75/25 KWIKPEN) (75-25) 100 UNIT/ML Kwikpen INJECT 15 UNITS SUBCUTANEOUSLY ONCE DAILY WITH BREAKFAST 03/19/23   Reather Littler, MD  Insulin Pen Needle (ULTRA-THIN II MINI PEN NEEDLE) 31G X 5 MM MISC USE AS DIRECTED 09/19/22   Reather Littler, MD  JARDIANCE 25 MG TABS tablet TAKE 1 TABLET BY MOUTH ONCE DAILY BEFORE BREAKFAST Patient taking differently: Take 25 mg by mouth See admin instructions. Take 1 tablet (25 mg) by mouth in the evening as needed with high carbohydrates evening meals 05/18/22   Reather Littler, MD  LAGEVRIO 200 MG CAPS capsule SMARTSIG:4 Capsule(s) By Mouth Every 12 Hours 10/31/22   [provider]  Lancets [facility] DELICA PLUS LANCET33G) MISC Use to check blood sugars twice daily. Patient taking differently: Use to check blood sugars  daily. 04/24/21   Reather Littler, MD  Menatetrenone (VITAMIN K2) 100 MCG TABS Take 100 mcg by mouth at bedtime.    [provider]  metFORMIN (GLUCOPHAGE) 1000 MG  tablet Take 1 tablet by mouth twice daily 03/26/22   Runell Gess, MD  metoprolol tartrate (LOPRESSOR) 25 MG tablet TAKE 1 & 1/2 (ONE & ONE-HALF) TABLETS BY MOUTH TWICE DAILY 07/18/22   Runell Gess, MD  mupirocin ointment (BACTROBAN) 2 % Apply 1 Application topically 2 (two) times daily. 03/14/23   Vivi Barrack, DPM  ONETOUCH VERIO test strip USE TO CHECK BLOOD SUGARS ONCE DAILY 07/19/22   Reather Littler, MD  pantoprazole (PROTONIX) 40 MG tablet Take 1 tablet by mouth  once daily Patient taking differently: Take 40 mg by mouth See admin instructions. Take Sun, Mon., Wed., and Friday in the evening 12/21/21   Runell Gess, MD  Probiotic Product ([employer] SENIOR PROBIOTIC PO) Take 1 capsule by mouth every evening.    [provider]  Propylene Glycol (SYSTANE BALANCE) 0.6 % SOLN Place 1 drop into the left eye 3 (three) times daily as needed (dry/irritated eyes).    [provider]  repaglinide (PRANDIN) 2 MG tablet Take 1 tab by breakfast and 1 tab before dinner 12/18/22   Reather Littler, MD  rivaroxaban Carlena Hurl) 2.5 MG TABS tablet Take 1 tablet by mouth twice daily 02/19/23   Leonie Douglas, MD  telmisartan (MICARDIS) 80 MG tablet Take 1 tablet by mouth once daily 08/21/22   Runell Gess, MD      Allergies    Versed [midazolam], Augmentin [amoxicillin-pot clavulanate], Bactrim [sulfamethoxazole-trimethoprim], Demerol [meperidine], Meperidine hcl, Scopolamine, and Tramadol    Review of Systems   Review of Systems  Physical Exam Updated Vital Signs BP (!) 176/79 (BP Location: Right Arm)   Pulse 92   Temp 98.1 F (36.7 C)   Resp 18   SpO2 98%  Physical Exam Vitals and nursing note reviewed.  Constitutional:      General: She is not in acute distress.    Appearance: She is well-developed.  HENT:     Head: Normocephalic and atraumatic.     Mouth/Throat:     Mouth: Mucous membranes are moist.  Eyes:     General: Scleral icterus present.     Conjunctiva/sclera: Conjunctivae normal.  Cardiovascular:     Rate and Rhythm: Normal rate and regular rhythm.  Pulmonary:     Effort: Pulmonary effort is normal. No respiratory distress.     Breath sounds: Normal breath sounds.  Abdominal:     Palpations: Abdomen is soft.     Tenderness: There is no abdominal tenderness.  Musculoskeletal:     Cervical back: Neck supple.  Skin:    General: Skin is warm and dry.     Capillary Refill: Capillary refill takes less than 2 seconds.      Coloration: Skin is jaundiced.  Neurological:     Mental Status: She is alert.  Psychiatric:        Mood and Affect: Mood normal.     ED Results / Procedures / Treatments   Labs (all labs ordered are listed, but only abnormal results are displayed) Labs Reviewed  CBC WITH DIFFERENTIAL/PLATELET - Abnormal; Notable for the following components:      Result Value   WBC 11.0 (*)    Hemoglobin 11.3 (*)    HCT 34.9 (*)    RDW 17.6 (*)    Neutro Abs 7.8 (*)    Monocytes Absolute 1.3 (*)    All other components within normal limits  COMPREHENSIVE METABOLIC PANEL - Abnormal; Notable for the following components:   Potassium 3.2 (*)  CO2 18 (*)    Glucose, Bld 159 (*)    BUN 27 (*)    Creatinine, Ser 1.87 (*)    Albumin 3.0 (*)    AST 185 (*)    ALT 137 (*)    Alkaline Phosphatase 646 (*)    Total Bilirubin 13.1 (*)    GFR, Estimated 27 (*)    All other components within normal limits  URINALYSIS, ROUTINE W REFLEX MICROSCOPIC - Abnormal; Notable for the following components:   Color, Urine AMBER (*)    APPearance HAZY (*)    Glucose, UA >=500 (*)    Hgb urine dipstick MODERATE (*)    Protein, ur 30 (*)    Bacteria, UA RARE (*)    All other components within normal limits  HEPATITIS PANEL, ACUTE    EKG None  Radiology CT ABDOMEN PELVIS WO CONTRAST  Result Date: 04/04/2023 CLINICAL DATA:  Jaundice, concern for malignancy. EXAM: CT ABDOMEN AND PELVIS WITHOUT CONTRAST TECHNIQUE: Multidetector CT imaging of the abdomen and pelvis was performed following the standard protocol without IV contrast. RADIATION DOSE REDUCTION: This exam was performed according to the departmental dose-optimization program which includes automated exposure control, adjustment of the mA and/or kV according to patient size and/or use of iterative reconstruction technique. COMPARISON:  MRI abdomen 08/30/2022.  CT abdomen 08/14/2022. FINDINGS: Lower chest: No acute abnormality. Hepatobiliary: There some  ill-defined heterogeneous hypodensity in the region of the gallbladder fossa measuring 2.4 x 3.0 cm this appears similar to the prior study given lack of contrast. Gallbladder is surgically absent. There is no biliary ductal dilatation. Pancreas: Unremarkable. No pancreatic ductal dilatation or surrounding inflammatory changes. Spleen: Normal in size without focal abnormality. Adrenals/Urinary Tract: There is a 3 mm calculus in the right kidney. There is no hydronephrosis or perinephric fat stranding. Left kidney, ureters and bladder are normal. Stomach/Bowel: Stomach is within normal limits. No evidence of bowel wall thickening, distention, or inflammatory changes. The appendix is not seen. There is a large amount of stool throughout colon. Vascular/Lymphatic: There severe atherosclerotic calcifications of the aorta, iliac arteries and branch vessels. There are no enlarged lymph nodes identified. Vascular stents are seen in the femoral regions along with postsurgical change and scarring bilaterally. Reproductive: Uterus and bilateral adnexa are unremarkable. Other: No abdominal wall hernia or abnormality. No abdominopelvic ascites. Musculoskeletal: There are few scattered rounded lucent areas in the vertebral bodies particularly at L2. These are similar to prior. IMPRESSION: 1. Ill-defined hypodensity in the region of the gallbladder fossa appears similar to the prior study given lack of contrast. This would be better evaluated with dedicated MRI. 2. Nonobstructing right renal calculus. 3. Large amount of stool throughout the colon. 4. Stable indeterminate lucent areas in the vertebral bodies of the lumbar spine. 5. Severe atherosclerotic disease. Aortic Atherosclerosis (ICD10-I70.0). Electronically Signed   By: Darliss Cheney M.D.   On: 04/04/2023 23:19    Procedures Procedures  {Document cardiac monitor, telemetry assessment procedure when appropriate:1}  Medications Ordered in ED Medications  sodium  chloride 0.9 % bolus 1,000 mL (1,000 mLs Intravenous New Bag/Given 04/04/23 2249)    ED Course/ Medical Decision Making/ A&P   {   Click here for ABCD2, HEART and other calculatorsREFRESH Note before signing :1}                          Medical Decision Making Amount and/or Complexity of Data Reviewed Labs: ordered. Radiology: ordered.  Risk Decision  regarding hospitalization.   This patient presents to the ED for concern of jaundice, this involves an extensive number of treatment options, and is a complaint that carries with it a high risk of complications and morbidity.  The differential diagnosis includes liver failure, malignancy, obstruction, others   Co morbidities that complicate the patient evaluation  Type II DM, hypertension, PAD   Additional history obtained:  Additional history obtained from family at bedside External records from outside source obtained and reviewed including primary care notes from earlier today, surgery notes showing previous evaluation of MR liver and thoughts that changes at that time were inflammatory in nature   Lab Tests:  I Ordered, and personally interpreted labs.  The pertinent results include: Elevated LFTs, AST 185, ALT 137, alk phos 646, T. bili 13.1; creatinine 1.87 with a GFR estimated 27; UA with moderate hemoglobin, glucose, protein; WBC 11.0   Imaging Studies ordered:  I ordered imaging studies including CT abdomen pelvis without contrast (GFR less than 30) I independently visualized and interpreted imaging which showed  1. Ill-defined hypodensity in the region of the gallbladder fossa  appears similar to the prior study given lack of contrast. This  would be better evaluated with dedicated MRI.  2. Nonobstructing right renal calculus.  3. Large amount of stool throughout the colon.  4. Stable indeterminate lucent areas in the vertebral bodies of the  lumbar spine.  5. Severe atherosclerotic disease.   I agree with the  radiologist interpretation   Consultations Obtained:  I requested consultation with the hospitalist,  and discussed lab and imaging findings as well as pertinent plan - they recommend: ***   Problem List / ED Course / Critical interventions / Medication management   I ordered medication including normal saline for acute kidney injury Reevaluation of the patient after these medicines showed that the patient stayed the same I have reviewed the patients home medicines and have made adjustments as needed   Test / Admission - Considered:  Patient with significantly elevated LFTs of unknown etiology, acute kidney injury (baseline creatinine appears to be 1.1).  Patient would benefit from admission for continued IV fluids, reassess of LFTs, further evaluation and management.   {Document critical care time when appropriate:1} {Document review of labs and clinical decision tools ie heart score, Chads2Vasc2 etc:1}  {Document your independent review of radiology images, and any outside records:1} {Document your discussion with family members, caretakers, and with consultants:1} {Document social determinants of health affecting pt's care:1} {Document your decision making why or why not admission, treatments were needed:1} Final Clinical Impression(s) / ED Diagnoses Final diagnoses:  Abnormal LFTs  AKI (acute kidney injury) (HCC)    Rx / DC Orders ED Discharge Orders     None

## 2023-04-04 NOTE — ED Notes (Signed)
Taken for CT scan at this time. 

## 2023-04-04 NOTE — ED Triage Notes (Signed)
Patient reports jaundice and bilateral icteric sclera onset yesterday .

## 2023-04-04 NOTE — ED Provider Notes (Signed)
[place] EMERGENCY DEPARTMENT AT [facility] Provider Note   CSN: [id number] Arrival date & time: 04/04/23  2124     History  Chief Complaint  Patient presents with   Jaundice    [name] is a 82 y.o. female.  Patient presents to the emergency department complaining of jaundiced skin, yellowing eyes which were noticed yesterday when the patient went to church.  She also complains of increased fatigue over the past week and darkened urine.  The patient states that she went to her primary care physician for the same earlier today and was told that her liver function was abnormal and that she appeared to be dehydrated.  It was recommended she come to the emergency department for further evaluation.  The patient states that she had a fenestrated laparoscopic cholecystectomy in January of this year.  Patient currently denies abdominal pain, nausea, vomiting, chest pain, shortness of breath, weakness, urinary symptoms.  Past medical history significant for hypertension, type II DM, PAD, CAD, NSTEMI, transmetatarsal amputation of foot (left), family history of malignant neoplasm of digestive organ  HPI     Home Medications Prior to Admission medications   Medication Sig Start Date End Date Taking? Authorizing Provider  acetaminophen (TYLENOL) 500 MG tablet Take 1 tablet (500 mg total) by mouth every 8 (eight) hours as needed for mild pain. 10/12/22   Fritzi Mandes, MD  Ascorbic Acid (VITA-C PO)  03/10/23   [provider]  aspirin EC 81 MG tablet Take 81 mg by mouth at bedtime. Swallow whole.    [provider]  atorvastatin (LIPITOR) 80 MG tablet TAKE 1 TABLET BY MOUTH ONCE DAILY. MUST  KEEP  UPCOMING  APPOINTMENT  FOR  FUTURE  REFILLS 02/19/23   Runell Gess, MD  brimonidine [facility]) 0.2 % ophthalmic solution Place 1 drop into the right eye 2 (two) times daily. 01/19/21   [provider]  Cholecalciferol (VITAMIN D3 GUMMIES PO) Take 2 tablets  by mouth at bedtime.    [provider]  CINNAMON PO Take 2,400 mg by mouth in the morning, at noon, and at bedtime. 1200 mg each Breakfast, supper & bedtime.    [provider]  Cyanocobalamin (B-12 PO) Take 1,000 mcg by mouth daily with supper.    [provider]  hydrochlorothiazide (HYDRODIURIL) 12.5 MG tablet Take 1 tablet by mouth once daily 03/19/23   Reather Littler, MD  Insulin Lispro Prot & Lispro (HUMALOG MIX 75/25 KWIKPEN) (75-25) 100 UNIT/ML Kwikpen INJECT 15 UNITS SUBCUTANEOUSLY ONCE DAILY WITH BREAKFAST 03/19/23   Reather Littler, MD  Insulin Pen Needle (ULTRA-THIN II MINI PEN NEEDLE) 31G X 5 MM MISC USE AS DIRECTED 09/19/22   Reather Littler, MD  JARDIANCE 25 MG TABS tablet TAKE 1 TABLET BY MOUTH ONCE DAILY BEFORE BREAKFAST Patient taking differently: Take 25 mg by mouth See admin instructions. Take 1 tablet (25 mg) by mouth in the evening as needed with high carbohydrates evening meals 05/18/22   Reather Littler, MD  LAGEVRIO 200 MG CAPS capsule SMARTSIG:4 Capsule(s) By Mouth Every 12 Hours 10/31/22   [provider]  Lancets [facility] DELICA PLUS LANCET33G) MISC Use to check blood sugars twice daily. Patient taking differently: Use to check blood sugars  daily. 04/24/21   Reather Littler, MD  Menatetrenone (VITAMIN K2) 100 MCG TABS Take 100 mcg by mouth at bedtime.    [provider]  metFORMIN (GLUCOPHAGE) 1000 MG tablet Take 1 tablet by mouth twice daily 03/26/22  Runell Gess, MD  metoprolol tartrate (LOPRESSOR) 25 MG tablet TAKE 1 & 1/2 (ONE & ONE-HALF) TABLETS BY MOUTH TWICE DAILY 07/18/22   Runell Gess, MD  mupirocin ointment (BACTROBAN) 2 % Apply 1 Application topically 2 (two) times daily. 03/14/23   Vivi Barrack, DPM  ONETOUCH VERIO test strip USE TO CHECK BLOOD SUGARS ONCE DAILY 07/19/22   Reather Littler, MD  pantoprazole (PROTONIX) 40 MG tablet Take 1 tablet by mouth once daily Patient taking differently: Take 40 mg by mouth See admin  instructions. Take Sun, Mon., Wed., and Friday in the evening 12/21/21   Runell Gess, MD  Probiotic Product ([employer] SENIOR PROBIOTIC PO) Take 1 capsule by mouth every evening.    [provider]  Propylene Glycol (SYSTANE BALANCE) 0.6 % SOLN Place 1 drop into the left eye 3 (three) times daily as needed (dry/irritated eyes).    [provider]  repaglinide (PRANDIN) 2 MG tablet Take 1 tab by breakfast and 1 tab before dinner 12/18/22   Reather Littler, MD  rivaroxaban Carlena Hurl) 2.5 MG TABS tablet Take 1 tablet by mouth twice daily 02/19/23   Leonie Douglas, MD  telmisartan (MICARDIS) 80 MG tablet Take 1 tablet by mouth once daily 08/21/22   Runell Gess, MD      Allergies    Versed [midazolam], Augmentin [amoxicillin-pot clavulanate], Bactrim [sulfamethoxazole-trimethoprim], Demerol [meperidine], Meperidine hcl, Scopolamine, and Tramadol    Review of Systems   Review of Systems  Physical Exam Updated Vital Signs BP (!) 176/79 (BP Location: Right Arm)   Pulse 92   Temp 98.1 F (36.7 C)   Resp 18   SpO2 98%  Physical Exam Vitals and nursing note reviewed.  Constitutional:      General: She is not in acute distress.    Appearance: She is well-developed.  HENT:     Head: Normocephalic and atraumatic.     Mouth/Throat:     Mouth: Mucous membranes are moist.  Eyes:     General: Scleral icterus present.     Conjunctiva/sclera: Conjunctivae normal.  Cardiovascular:     Rate and Rhythm: Normal rate and regular rhythm.  Pulmonary:     Effort: Pulmonary effort is normal. No respiratory distress.     Breath sounds: Normal breath sounds.  Abdominal:     Palpations: Abdomen is soft.     Tenderness: There is no abdominal tenderness.  Musculoskeletal:     Cervical back: Neck supple.  Skin:    General: Skin is warm and dry.     Capillary Refill: Capillary refill takes less than 2 seconds.     Coloration: Skin is jaundiced.  Neurological:     Mental Status: She is  alert.  Psychiatric:        Mood and Affect: Mood normal.     ED Results / Procedures / Treatments   Labs (all labs ordered are listed, but only abnormal results are displayed) Labs Reviewed  CBC WITH DIFFERENTIAL/PLATELET - Abnormal; Notable for the following components:      Result Value   WBC 11.0 (*)    Hemoglobin 11.3 (*)    HCT 34.9 (*)    RDW 17.6 (*)    Neutro Abs 7.8 (*)    Monocytes Absolute 1.3 (*)    All other components within normal limits  COMPREHENSIVE METABOLIC PANEL - Abnormal; Notable for the following components:   Potassium 3.2 (*)    CO2 18 (*)    Glucose, Bld  159 (*)    BUN 27 (*)    Creatinine, Ser 1.87 (*)    Albumin 3.0 (*)    AST 185 (*)    ALT 137 (*)    Alkaline Phosphatase 646 (*)    Total Bilirubin 13.1 (*)    GFR, Estimated 27 (*)    All other components within normal limits  URINALYSIS, ROUTINE W REFLEX MICROSCOPIC - Abnormal; Notable for the following components:   Color, Urine AMBER (*)    APPearance HAZY (*)    Glucose, UA >=500 (*)    Hgb urine dipstick MODERATE (*)    Protein, ur 30 (*)    Bacteria, UA RARE (*)    All other components within normal limits  HEPATITIS PANEL, ACUTE    EKG None  Radiology CT ABDOMEN PELVIS WO CONTRAST  Result Date: 04/04/2023 CLINICAL DATA:  Jaundice, concern for malignancy. EXAM: CT ABDOMEN AND PELVIS WITHOUT CONTRAST TECHNIQUE: Multidetector CT imaging of the abdomen and pelvis was performed following the standard protocol without IV contrast. RADIATION DOSE REDUCTION: This exam was performed according to the departmental dose-optimization program which includes automated exposure control, adjustment of the mA and/or kV according to patient size and/or use of iterative reconstruction technique. COMPARISON:  MRI abdomen 08/30/2022.  CT abdomen 08/14/2022. FINDINGS: Lower chest: No acute abnormality. Hepatobiliary: There some ill-defined heterogeneous hypodensity in the region of the gallbladder fossa  measuring 2.4 x 3.0 cm this appears similar to the prior study given lack of contrast. Gallbladder is surgically absent. There is no biliary ductal dilatation. Pancreas: Unremarkable. No pancreatic ductal dilatation or surrounding inflammatory changes. Spleen: Normal in size without focal abnormality. Adrenals/Urinary Tract: There is a 3 mm calculus in the right kidney. There is no hydronephrosis or perinephric fat stranding. Left kidney, ureters and bladder are normal. Stomach/Bowel: Stomach is within normal limits. No evidence of bowel wall thickening, distention, or inflammatory changes. The appendix is not seen. There is a large amount of stool throughout colon. Vascular/Lymphatic: There severe atherosclerotic calcifications of the aorta, iliac arteries and branch vessels. There are no enlarged lymph nodes identified. Vascular stents are seen in the femoral regions along with postsurgical change and scarring bilaterally. Reproductive: Uterus and bilateral adnexa are unremarkable. Other: No abdominal wall hernia or abnormality. No abdominopelvic ascites. Musculoskeletal: There are few scattered rounded lucent areas in the vertebral bodies particularly at L2. These are similar to prior. IMPRESSION: 1. Ill-defined hypodensity in the region of the gallbladder fossa appears similar to the prior study given lack of contrast. This would be better evaluated with dedicated MRI. 2. Nonobstructing right renal calculus. 3. Large amount of stool throughout the colon. 4. Stable indeterminate lucent areas in the vertebral bodies of the lumbar spine. 5. Severe atherosclerotic disease. Aortic Atherosclerosis (ICD10-I70.0). Electronically Signed   By: Darliss Cheney M.D.   On: 04/04/2023 23:19    Procedures Procedures    Medications Ordered in ED Medications  sodium chloride 0.9 % bolus 1,000 mL (1,000 mLs Intravenous New Bag/Given 04/04/23 2249)    ED Course/ Medical Decision Making/ A&P                              Medical Decision Making Amount and/or Complexity of Data Reviewed Labs: ordered. Radiology: ordered.  Risk Decision regarding hospitalization.   This patient presents to the ED for concern of jaundice, this involves an extensive number of treatment options, and is a complaint that carries with  it a high risk of complications and morbidity.  The differential diagnosis includes liver failure, malignancy, obstruction, others   Co morbidities that complicate the patient evaluation  Type II DM, hypertension, PAD   Additional history obtained:  Additional history obtained from family at bedside External records from outside source obtained and reviewed including primary care notes from earlier today, surgery notes showing previous evaluation of MR liver and thoughts that changes at that time were inflammatory in nature   Lab Tests:  I Ordered, and personally interpreted labs.  The pertinent results include: Elevated LFTs, AST 185, ALT 137, alk phos 646, T. bili 13.1; creatinine 1.87 with a GFR estimated 27; UA with moderate hemoglobin, glucose, protein; WBC 11.0   Imaging Studies ordered:  I ordered imaging studies including CT abdomen pelvis without contrast (GFR less than 30) I independently visualized and interpreted imaging which showed  1. Ill-defined hypodensity in the region of the gallbladder fossa  appears similar to the prior study given lack of contrast. This  would be better evaluated with dedicated MRI.  2. Nonobstructing right renal calculus.  3. Large amount of stool throughout the colon.  4. Stable indeterminate lucent areas in the vertebral bodies of the  lumbar spine.  5. Severe atherosclerotic disease.   I agree with the radiologist interpretation   Consultations Obtained:  I requested consultation with the hospitalist,  and discussed lab and imaging findings as well as pertinent plan - they recommend: admission   Problem List / ED Course / Critical  interventions / Medication management   I ordered medication including normal saline for acute kidney injury Reevaluation of the patient after these medicines showed that the patient stayed the same I have reviewed the patients home medicines and have made adjustments as needed   Test / Admission - Considered:  Patient with significantly elevated LFTs of unknown etiology, acute kidney injury (baseline creatinine appears to be 1.1).  Patient would benefit from admission for continued IV fluids, reassess of LFTs, further evaluation and management.         Final Clinical Impression(s) / ED Diagnoses Final diagnoses:  Abnormal LFTs  AKI (acute kidney injury) (HCC)  Jaundice    Rx / DC Orders ED Discharge Orders     None         Pamala Duffel 04/05/23 0013    Lonell Grandchild, MD 04/11/23 [phone]

## 2023-04-05 ENCOUNTER — Encounter (HOSPITAL_COMMUNITY): Payer: Self-pay | Admitting: Family Medicine

## 2023-04-05 ENCOUNTER — Inpatient Hospital Stay (HOSPITAL_COMMUNITY): Payer: Medicare Other

## 2023-04-05 DIAGNOSIS — I2510 Atherosclerotic heart disease of native coronary artery without angina pectoris: Secondary | ICD-10-CM

## 2023-04-05 DIAGNOSIS — R17 Unspecified jaundice: Secondary | ICD-10-CM | POA: Diagnosis not present

## 2023-04-05 LAB — CBC
HCT: 28.7 % — ABNORMAL LOW (ref 36.0–46.0)
Hemoglobin: 9.6 g/dL — ABNORMAL LOW (ref 12.0–15.0)
MCH: 28.6 pg (ref 26.0–34.0)
MCHC: 33.4 g/dL (ref 30.0–36.0)
MCV: 85.4 fL (ref 80.0–100.0)
Platelets: 171 10*3/uL (ref 150–400)
RBC: 3.36 MIL/uL — ABNORMAL LOW (ref 3.87–5.11)
RDW: 17.9 % — ABNORMAL HIGH (ref 11.5–15.5)
WBC: 9.9 10*3/uL (ref 4.0–10.5)
nRBC: 0 % (ref 0.0–0.2)

## 2023-04-05 LAB — BASIC METABOLIC PANEL
Anion gap: 8 (ref 5–15)
BUN: 20 mg/dL (ref 8–23)
CO2: 19 mmol/L — ABNORMAL LOW (ref 22–32)
Calcium: 9.2 mg/dL (ref 8.9–10.3)
Chloride: 114 mmol/L — ABNORMAL HIGH (ref 98–111)
Creatinine, Ser: 1.62 mg/dL — ABNORMAL HIGH (ref 0.44–1.00)
GFR, Estimated: 32 mL/min — ABNORMAL LOW (ref >60)
Glucose, Bld: 86 mg/dL (ref 70–99)
Potassium: 4.6 mmol/L (ref 3.5–5.1)
Sodium: 141 mmol/L (ref 135–145)

## 2023-04-05 LAB — HEPATITIS PANEL, ACUTE
HCV Ab: NONREACTIVE
Hep A IgM: NONREACTIVE
Hep B C IgM: NONREACTIVE
Hepatitis B Surface Ag: NONREACTIVE

## 2023-04-05 LAB — COMPREHENSIVE METABOLIC PANEL
ALT: 115 U/L — ABNORMAL HIGH (ref 0–44)
AST: 163 U/L — ABNORMAL HIGH (ref 15–41)
Albumin: 2.5 g/dL — ABNORMAL LOW (ref 3.5–5.0)
Alkaline Phosphatase: 550 U/L — ABNORMAL HIGH (ref 38–126)
Anion gap: 11 (ref 5–15)
BUN: 25 mg/dL — ABNORMAL HIGH (ref 8–23)
CO2: 17 mmol/L — ABNORMAL LOW (ref 22–32)
Calcium: 8.4 mg/dL — ABNORMAL LOW (ref 8.9–10.3)
Chloride: 110 mmol/L (ref 98–111)
Creatinine, Ser: 1.75 mg/dL — ABNORMAL HIGH (ref 0.44–1.00)
GFR, Estimated: 29 mL/min — ABNORMAL LOW (ref >60)
Glucose, Bld: 60 mg/dL — ABNORMAL LOW (ref 70–99)
Potassium: 2.9 mmol/L — ABNORMAL LOW (ref 3.5–5.1)
Sodium: 138 mmol/L (ref 135–145)
Total Bilirubin: 11.1 mg/dL — ABNORMAL HIGH (ref 0.3–1.2)
Total Protein: 5.7 g/dL — ABNORMAL LOW (ref 6.5–8.1)

## 2023-04-05 LAB — GLUCOSE, CAPILLARY
Glucose-Capillary: 87 mg/dL (ref 70–99)
Glucose-Capillary: 136 mg/dL — ABNORMAL HIGH (ref 70–99)
Glucose-Capillary: 94 mg/dL (ref 70–99)
Glucose-Capillary: 73 mg/dL (ref 70–99)
Glucose-Capillary: 190 mg/dL — ABNORMAL HIGH (ref 70–99)

## 2023-04-05 LAB — LIPASE, BLOOD: Lipase: 43 U/L (ref 11–51)

## 2023-04-05 LAB — MAGNESIUM: Magnesium: 1.4 mg/dL — ABNORMAL LOW (ref 1.7–2.4)

## 2023-04-05 LAB — PROTIME-INR
INR: 1.7 — ABNORMAL HIGH (ref 0.8–1.2)
Prothrombin Time: 20.6 seconds — ABNORMAL HIGH (ref 11.4–15.2)

## 2023-04-05 LAB — CBG MONITORING, ED: Glucose-Capillary: 53 mg/dL — ABNORMAL LOW (ref 70–99)

## 2023-04-05 MED ORDER — INSULIN ASPART 100 UNIT/ML IJ SOLN
0.0000 [IU] | INTRAMUSCULAR | Status: DC
  Administered 2023-04-05: 1 [IU] via SUBCUTANEOUS
  Administered 2023-04-06: 2 [IU] via SUBCUTANEOUS
  Administered 2023-04-06: 1 [IU] via SUBCUTANEOUS
  Administered 2023-04-06: 2 [IU] via SUBCUTANEOUS
  Administered 2023-04-06: 1 [IU] via SUBCUTANEOUS
  Administered 2023-04-07: 2 [IU] via SUBCUTANEOUS
  Administered 2023-04-07: 4 [IU] via SUBCUTANEOUS
  Administered 2023-04-07: 1 [IU] via SUBCUTANEOUS
  Administered 2023-04-08: 3 [IU] via SUBCUTANEOUS
  Administered 2023-04-08: 1 [IU] via SUBCUTANEOUS
  Administered 2023-04-08 (×3): 2 [IU] via SUBCUTANEOUS
  Administered 2023-04-09: 1 [IU] via SUBCUTANEOUS

## 2023-04-05 MED ORDER — SENNOSIDES-DOCUSATE SODIUM 8.6-50 MG PO TABS: 1.0000 | ORAL_TABLET | Freq: Every evening | ORAL | Status: DC | PRN

## 2023-04-05 MED ORDER — POTASSIUM CHLORIDE CRYS ER 20 MEQ PO TBCR
40.0000 meq | EXTENDED_RELEASE_TABLET | Freq: Once | ORAL | Status: AC
  Administered 2023-04-05: 40 meq via ORAL
  Filled 2023-04-05: qty 2

## 2023-04-05 MED ORDER — ONDANSETRON HCL 4 MG PO TABS: 4.0000 mg | ORAL_TABLET | Freq: Four times a day (QID) | ORAL | Status: DC | PRN

## 2023-04-05 MED ORDER — METOPROLOL TARTRATE 25 MG PO TABS
25.0000 mg | ORAL_TABLET | Freq: Two times a day (BID) | ORAL | Status: DC
  Administered 2023-04-05 (×2): 25 mg via ORAL
  Filled 2023-04-05 (×3): qty 1

## 2023-04-05 MED ORDER — ACETAMINOPHEN 325 MG PO TABS: 325.0000 mg | ORAL_TABLET | Freq: Four times a day (QID) | ORAL | Status: DC | PRN

## 2023-04-05 MED ORDER — POTASSIUM CHLORIDE 10 MEQ/100ML IV SOLN
10.0000 meq | INTRAVENOUS | Status: DC
  Administered 2023-04-05: 10 meq via INTRAVENOUS

## 2023-04-05 MED ORDER — METOPROLOL TARTRATE 12.5 MG HALF TABLET
12.5000 mg | ORAL_TABLET | Freq: Once | ORAL | Status: AC
  Administered 2023-04-05: 12.5 mg via ORAL
  Filled 2023-04-05: qty 1

## 2023-04-05 MED ORDER — METOPROLOL TARTRATE 25 MG PO TABS
37.5000 mg | ORAL_TABLET | Freq: Two times a day (BID) | ORAL | Status: DC
  Administered 2023-04-06 – 2023-04-07 (×3): 37.5 mg via ORAL
  Filled 2023-04-05 (×3): qty 1

## 2023-04-05 MED ORDER — GADOBUTROL 1 MMOL/ML IV SOLN
6.0000 mL | Freq: Once | INTRAVENOUS | Status: AC | PRN
  Administered 2023-04-05: 6 mL via INTRAVENOUS

## 2023-04-05 MED ORDER — SODIUM CHLORIDE 0.9 % IV SOLN: INTRAVENOUS | Status: DC

## 2023-04-05 MED ORDER — BRIMONIDINE TARTRATE 0.2 % OP SOLN
1.0000 [drp] | Freq: Two times a day (BID) | OPHTHALMIC | Status: DC
  Administered 2023-04-05 – 2023-04-09 (×8): 1 [drp] via OPHTHALMIC
  Filled 2023-04-05: qty 5

## 2023-04-05 MED ORDER — ONDANSETRON HCL 4 MG/2ML IJ SOLN: 4.0000 mg | Freq: Four times a day (QID) | INTRAMUSCULAR | Status: DC | PRN

## 2023-04-05 MED ORDER — DEXTROSE-SODIUM CHLORIDE 5-0.9 % IV SOLN: INTRAVENOUS | Status: DC

## 2023-04-05 MED ORDER — RIVAROXABAN 2.5 MG PO TABS
2.5000 mg | ORAL_TABLET | Freq: Two times a day (BID) | ORAL | Status: DC
  Filled 2023-04-05 (×3): qty 1

## 2023-04-05 MED ORDER — PANTOPRAZOLE SODIUM 40 MG PO TBEC
40.0000 mg | DELAYED_RELEASE_TABLET | Freq: Every day | ORAL | Status: DC
  Administered 2023-04-05 – 2023-04-09 (×5): 40 mg via ORAL
  Filled 2023-04-05 (×5): qty 1

## 2023-04-05 MED ORDER — HYDRALAZINE HCL 20 MG/ML IJ SOLN
10.0000 mg | INTRAMUSCULAR | Status: DC | PRN
  Administered 2023-04-07: 10 mg via INTRAVENOUS
  Filled 2023-04-05: qty 1

## 2023-04-05 MED ORDER — POTASSIUM CHLORIDE 10 MEQ/100ML IV SOLN
10.0000 meq | INTRAVENOUS | Status: AC
  Administered 2023-04-05 (×2): 10 meq via INTRAVENOUS
  Filled 2023-04-05 (×3): qty 100

## 2023-04-05 MED ORDER — POTASSIUM CHLORIDE CRYS ER 20 MEQ PO TBCR
20.0000 meq | EXTENDED_RELEASE_TABLET | Freq: Once | ORAL | Status: AC
  Administered 2023-04-05: 20 meq via ORAL
  Filled 2023-04-05: qty 1

## 2023-04-05 MED ORDER — MAGNESIUM SULFATE 4 GM/100ML IV SOLN
4.0000 g | Freq: Once | INTRAVENOUS | Status: AC
  Administered 2023-04-05: 4 g via INTRAVENOUS
  Filled 2023-04-05: qty 100

## 2023-04-05 NOTE — Progress Notes (Signed)
Patient states that she takes 37.5 mg of metoprolol 2x/day.  Savannah Erbe

## 2023-04-05 NOTE — H&P (Signed)
History and Physical    [name] OZH:[id number] DOB: [birth year] DOA: 04/04/2023  PCP: Shon Hale, MD   Patient coming from: Home   Chief Complaint: Jaundice   HPI: [name] is a pleasant 82 y.o. female with medical history significant for hypertension, insulin-dependent diabetes mellitus, CAD, PAD, and cholecystectomy in January of this year presents to the emergency department with jaundice.  Patient reports approximately 1 week of fatigue, loss of appetite, and postprandial bloating.  Yesterday, her pastor raise concern that she appeared to be jaundiced.  She denies any abdominal pain, nausea, vomiting, fever, or chills.  She reports history of chronic diarrhea which is unchanged.  ED Course: Upon arrival to the ED, patient is found to be afebrile and saturating well on room air with normal heart rate and elevated blood pressure.  Labs are most notable for creatinine 1.87, alkaline phosphatase 646, AST 185, ALT 137, and total bilirubin 13.1.  CT demonstrates ill-defined hypodensity in the region of the gallbladder fossa which appears similar to prior study.  Patient was given 1 L of normal saline in the ED.  Review of Systems:  All other systems reviewed and apart from HPI, are negative.  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 There is a small calcified mass in the L:A near the LA appendage. No valvular masses seen with associated mitral  annular calcification. LA Volume/ BSA27.4 ml/m2 No AS. Right ventricular systolic pressure is elevated at .   Myocardial infarction [facility])    June 2021   Neuromuscular disorder (HCC)    neuropathy in bilateral feet   Peripheral vascular disease (HCC)    Pneumonia    not hosp.    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  PTCAs with drug coated balloon Lt ant tibial artery and Lt popliteal artery  11/26/2019    Past Surgical History:  Procedure Laterality Date   ABDOMINAL AORTAGRAM  11/25/2019   ABDOMINAL AORTOGRAM W/LOWER EXTREMITY    ABDOMINAL AORTOGRAM N/A 06/15/2019   Procedure: ABDOMINAL AORTOGRAM;  Surgeon: Runell Gess, MD;  [facility];  Service: Cardiovascular;  Laterality: N/A;   ABDOMINAL AORTOGRAM W/LOWER EXTREMITY N/A 11/25/2019   Procedure: ABDOMINAL AORTOGRAM W/LOWER EXTREMITY;  Surgeon: Iran Ouch, MD;  [facility]: [facility];  Service: Cardiovascular;  Laterality: N/A;  Lt leg   ABDOMINAL AORTOGRAM W/LOWER EXTREMITY N/A 08/01/2020   Procedure: ABDOMINAL AORTOGRAM W/LOWER EXTREMITY;  Surgeon: Runell Gess, MD;  [facility];  Service: Cardiovascular;  Laterality: N/A;   ABDOMINAL AORTOGRAM W/LOWER EXTREMITY N/A 04/28/2021   Procedure: ABDOMINAL AORTOGRAM W/LOWER EXTREMITY;  Surgeon: Sherren Kerns, MD;  [facility];  Service: Cardiovascular;  Laterality: N/A;   ABDOMINAL AORTOGRAM W/LOWER EXTREMITY N/A 10/13/2021   Procedure: ABDOMINAL AORTOGRAM W/LOWER EXTREMITY;  Surgeon: Leonie Douglas,  MD;  [facility];  Service: Cardiovascular;  Laterality: N/A;   ABDOMINAL AORTOGRAM W/LOWER EXTREMITY N/A 11/09/2022   Procedure: ABDOMINAL AORTOGRAM W/LOWER EXTREMITY;  Surgeon: Leonie Douglas, MD;  [facility];  Service: Cardiovascular;  Laterality: N/A;   ABDOMINAL AORTOGRAM W/LOWER EXTREMITY N/A 11/16/2022   Procedure: ABDOMINAL AORTOGRAM W/LOWER EXTREMITY;   Surgeon: Leonie Douglas, MD;  [facility];  Service: Cardiovascular;  Laterality: N/A;   AMPUTATION TOE Left 05/03/2021   Procedure: AMPUTATION OF THIRD LEFT  TOE;  Surgeon: Leonie Douglas, MD;  Location: [facility] OR;  Service: Vascular;  Laterality: Left;   APPENDECTOMY     APPLICATION OF WOUND VAC Right 07/21/2019   Procedure: Application Of Prevena Wound Vac Right Groin;  Surgeon: Nada Libman, MD;  [facility];  Service: Vascular;  Laterality: Right;   CARPAL TUNNEL RELEASE Left    CARPAL TUNNEL RELEASE Right    CESAREAN SECTION     x 2   CHOLECYSTECTOMY N/A 10/12/2022   Procedure: LAPAROSCOPIC PARTIAL FENESTRATING CHOLECYSTECTOMY;  Surgeon: Fritzi Mandes, MD;  [facility];  Service: General;  Laterality: N/A;   COLONOSCOPY     CORONARY ARTERY BYPASS GRAFT N/A 03/24/2020   Procedure: CORONARY ARTERY BYPASS GRAFTING (CABG) using LIMA to LAD (m); RIMA to RAMUS; Endoscopic Right Greater Saphenous Vein: SVG to Diag1; SVG to PLB (right); and SVG to PL (left).;  Surgeon: Linden Dolin, MD;  [facility];  Service: Open Heart Surgery;  Laterality: N/A;  BILATERAL IMA   ENDARTERECTOMY FEMORAL Right 07/21/2019   Procedure: RIGHT ENDARTERECTOMY FEMORAL WITH PATCH ANGIOPLASTY;  Surgeon: Nada Libman, MD;  [facility];  Service: Vascular;  Laterality: Right;   ENDARTERECTOMY FEMORAL Right 08/16/2020   Procedure: RIGHT FEMORAL ENDARTERECTOMY;  Surgeon: Maeola Harman, MD;  Location: [facility] OR;  Service: Vascular;  Laterality: Right;   ENDOVEIN HARVEST OF GREATER SAPHENOUS VEIN Right 03/24/2020   Procedure: ENDOVEIN HARVEST OF GREATER SAPHENOUS VEIN;  Surgeon: Linden Dolin, MD;  [facility];  Service: Open Heart Surgery;  Laterality: Right;   EYE SURGERY     cataract removal bilaterally   FEMORAL-POPLITEAL BYPASS GRAFT Right 08/16/2020   Procedure: BYPASS GRAFT FEMORAL-POPLITEAL ARTERY;  Surgeon: Maeola Harman, MD;  Location: [facility] OR;   Service: Vascular;  Laterality: Right;   FEMORAL-POPLITEAL BYPASS GRAFT Left 05/03/2021   Procedure: LEFT FEMORAL TO BELOW KNEE POPLITEAL ARTERY BYPASS GRAFTING WITH 6MMX80 PTFE GRAFT;  Surgeon: Leonie Douglas, MD;  [facility];  Service: Vascular;  Laterality: Left;   LEFT HEART CATH AND CORONARY ANGIOGRAPHY N/A 03/22/2020   Procedure: LEFT HEART CATH AND CORONARY ANGIOGRAPHY;  Surgeon: Swazil[place], Peter M, MD;  Location: [facility] INVASIVE CV LAB;  Service: Cardiovascular;  Laterality: N/A;   LOWER EXTREMITY ANGIOGRAPHY Bilateral 06/15/2019   Procedure: Lower Extremity Angiography;  Surgeon: Runell Gess, MD;  Location: [facility] INVASIVE CV LAB;  Service: Cardiovascular;  Laterality: Bilateral;   LOWER EXTREMITY ANGIOGRAPHY Right 08/03/2019   Procedure: LOWER EXTREMITY ANGIOGRAPHY;  Surgeon: Runell Gess, MD;  [facility];  Service: Cardiovascular;  Laterality: Right;   LOWER EXTREMITY ANGIOGRAPHY N/A 09/07/2021   Procedure: LOWER EXTREMITY ANGIOGRAPHY;  Surgeon: Cephus Shelling, MD;  [facility];  Service: Cardiovascular;  Laterality: N/A;   PATCH ANGIOPLASTY Right 07/21/2019   Procedure: Patch Angioplasty Right Femoral Artery;  Surgeon: Nada Libman, MD;  [facility];  Service: Vascular;  Laterality: Right;   PERIPHERAL INTRAVASCULAR LITHOTRIPSY  11/25/2019   Procedure: INTRAVASCULAR LITHOTRIPSY;  Surgeon: Iran Ouch, MD;  [facility];  Service: Cardiovascular;;  LT. SFA   PERIPHERAL VASCULAR ATHERECTOMY  08/03/2019   Procedure: PERIPHERAL VASCULAR ATHERECTOMY;  Surgeon: Runell Gess, MD;  Location: [facility] INVAS[facility] CV LAB;  Service: Cardiovascular;;  right SFA, right TP trunk   PERIPHERAL VASCULAR ATHERECTOMY  11/25/2019   Procedure: PERIPHERAL VASCULAR ATHERECTOMY;  Surgeon: Iran Ouch, MD;  [facility];  Service: Cardiovascular;;  Lt.  POPLITEAL and AT   PERIPHERAL VASCULAR BALLOON ANGIOPLASTY  Left 06/15/2019   Procedure: PERIPHERAL VASCULAR BALLOON ANGIOPLASTY;  Surgeon: Runell Gess, MD;  [facility];  Service: Cardiovascular;  Laterality: Left;  SFA UNSUCCESSFUL UNABLE TO CROSS LESION   PERIPHERAL VASCULAR BALLOON ANGIOPLASTY  08/03/2019   Procedure: PERIPHERAL VASCULAR BALLOON ANGIOPLASTY;  Surgeon: Runell Gess, MD;  [facility];  Service: Cardiovascular;;  right SFA, Right TP trunk   PERIPHERAL VASCULAR BALLOON ANGIOPLASTY  09/07/2021   Procedure: PERIPHERAL VASCULAR BALLOON ANGIOPLASTY;  Surgeon: Cephus Shelling, MD;  [facility];  Service: Cardiovascular;;   PERIPHERAL VASCULAR BALLOON ANGIOPLASTY Right 10/13/2021   Procedure: PERIPHERAL VASCULAR BALLOON ANGIOPLASTY;  Surgeon: Leonie Douglas, MD;  [facility];  Service: Cardiovascular;  Laterality: Right;   PERIPHERAL VASCULAR BALLOON ANGIOPLASTY  11/09/2022   Procedure: PERIPHERAL VASCULAR BALLOON ANGIOPLASTY;  Surgeon: Leonie Douglas, MD;  [facility];  Service: Cardiovascular;;  fem-pop bypass and AT   TEE WITHOUT CARDIOVERSION N/A 03/24/2020   Procedure: TRANSESOPHAGEAL ECHOCARDIOGRAM (TEE);  Surgeon: Linden Dolin, MD;  Location: [facility] OR;  Service: Open Heart Surgery;  Laterality: N/A;   TONSILLECTOMY     and adenoidectomy   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/2019   Procedure: TRANSMETATARSAL AMPUTATION;  Surgeon: Vivi Barrack, DPM;  [facility];  Service: Podiatry;  Laterality: Right;   TRANSMETATARSAL AMPUTATION Left 05/11/2021   Procedure: LEFT TRANSMETATARSAL AMPUTATION;  Surgeon: Maeola Harman, MD;  Location: [facility] OR;  Service: Vascular;  Laterality: Left;   TUBAL LIGATION      Social History:   reports that she quit smoking about 51 years ago. Her smoking use included cigarettes. She has never used smokeless tobacco. She reports that she does not drink alcohol and does not use  drugs.  Allergies  Allergen Reactions   Versed [Midazolam] Anxiety    Frantic, out of my mind, agitated    Augmentin [Amoxicillin-Pot Clavulanate] Other (See Comments)    Elevates potassium level   Bactrim [Sulfamethoxazole-Trimethoprim] Other (See Comments)    ^ K+( elevated)    Demerol [Meperidine]     Delusional    Meperidine Hcl     Other reaction(s): delusional   Scopolamine     Delusional    Tramadol Other (See Comments)    Keeps awake    Family History  Problem Relation Age of Onset   Cancer - Prostate Father    Cancer - Colon Father    Stroke Mother    Hypertension Mother    Hyperlipidemia Mother    Melanoma Brother      Prior to Admission medications   Medication Sig Start Date End Date Taking? Authorizing Provider  acetaminophen (TYLENOL) 500 MG tablet Take 1 tablet (500 mg total) by mouth every 8 (eight) hours as needed for mild pain. 10/12/22   Freida Busman,  Ree Kida, MD  Ascorbic Acid (VITA-C PO)  03/10/23   [provider]  aspirin EC 81 MG tablet Take 81 mg by mouth at bedtime. Swallow whole.    [provider]  atorvastatin (LIPITOR) 80 MG tablet TAKE 1 TABLET BY MOUTH ONCE DAILY. MUST  KEEP  UPCOMING  APPOINTMENT  FOR  FUTURE  REFILLS 02/19/23   Runell Gess, MD  brimonidine [facility]) 0.2 % ophthalmic solution Place 1 drop into the right eye 2 (two) times daily. 01/19/21   [provider]  Cholecalciferol (VITAMIN D3 GUMMIES PO) Take 2 tablets by mouth at bedtime.    [provider]  CINNAMON PO Take 2,400 mg by mouth in the morning, at noon, and at bedtime. 1200 mg each Breakfast, supper & bedtime.    [provider]  Cyanocobalamin (B-12 PO) Take 1,000 mcg by mouth daily with supper.    [provider]  hydrochlorothiazide (HYDRODIURIL) 12.5 MG tablet Take 1 tablet by mouth once daily 03/19/23   Reather Littler, MD  Insulin Lispro Prot & Lispro (HUMALOG MIX 75/25 KWIKPEN) (75-25) 100 UNIT/ML Kwikpen INJECT 15  UNITS SUBCUTANEOUSLY ONCE DAILY WITH BREAKFAST 03/19/23   Reather Littler, MD  Insulin Pen Needle (ULTRA-THIN II MINI PEN NEEDLE) 31G X 5 MM MISC USE AS DIRECTED 09/19/22   Reather Littler, MD  JARDIANCE 25 MG TABS tablet TAKE 1 TABLET BY MOUTH ONCE DAILY BEFORE BREAKFAST Patient taking differently: Take 25 mg by mouth See admin instructions. Take 1 tablet (25 mg) by mouth in the evening as needed with high carbohydrates evening meals 05/18/22   Reather Littler, MD  LAGEVRIO 200 MG CAPS capsule SMARTSIG:4 Capsule(s) By Mouth Every 12 Hours 10/31/22   [provider]  Lancets [facility] DELICA PLUS LANCET33G) MISC Use to check blood sugars twice daily. Patient taking differently: Use to check blood sugars  daily. 04/24/21   Reather Littler, MD  Menatetrenone (VITAMIN K2) 100 MCG TABS Take 100 mcg by mouth at bedtime.    [provider]  metFORMIN (GLUCOPHAGE) 1000 MG tablet Take 1 tablet by mouth twice daily 03/26/22   Runell Gess, MD  metoprolol tartrate (LOPRESSOR) 25 MG tablet TAKE 1 & 1/2 (ONE & ONE-HALF) TABLETS BY MOUTH TWICE DAILY 07/18/22   Runell Gess, MD  mupirocin ointment (BACTROBAN) 2 % Apply 1 Application topically 2 (two) times daily. 03/14/23   Vivi Barrack, DPM  ONETOUCH VERIO test strip USE TO CHECK BLOOD SUGARS ONCE DAILY 07/19/22   Reather Littler, MD  pantoprazole (PROTONIX) 40 MG tablet Take 1 tablet by mouth once daily Patient taking differently: Take 40 mg by mouth See admin instructions. Take Sun, Mon., Wed., and Friday in the evening 12/21/21   Runell Gess, MD  Probiotic Product ([employer] SENIOR PROBIOTIC PO) Take 1 capsule by mouth every evening.    [provider]  Propylene Glycol (SYSTANE BALANCE) 0.6 % SOLN Place 1 drop into the left eye 3 (three) times daily as needed (dry/irritated eyes).    [provider]  repaglinide (PRANDIN) 2 MG tablet Take 1 tab by breakfast and 1 tab before dinner 12/18/22   Reather Littler, MD  rivaroxaban Carlena Hurl) 2.5 MG  TABS tablet Take 1 tablet by mouth twice daily 02/19/23   Leonie Douglas, MD  telmisartan (MICARDIS) 80 MG tablet Take 1 tablet by mouth once daily 08/21/22   Runell Gess, MD    Physical Exam: Vitals:   04/04/23 2129  BP: (!) 176/79  Pulse: 92  Resp: 18  Temp: 98.1 F (36.7 C)  SpO2: 98%    Constitutional: NAD, calm  Eyes: PERTLA, lids and conjunctivae normal ENMT: Mucous membranes are moist. Posterior pharynx clear of any exudate or lesions.   Neck: supple, no masses  Respiratory: no wheezing, no crackles. No accessory muscle use.  Cardiovascular: S1 & S2 heard, regular rate and rhythm. No extremity edema.   Abdomen: Soft, non-tender. Bowel sounds active.  Musculoskeletal: no clubbing / cyanosis. S/p b/l TMA.   Skin: no significant rashes, lesions, ulcers. Warm, dry, well-perfused. Neurologic: CN 2-12 grossly intact. Moving all extremities. Alert and oriented.  Psychiatric: Pleasant. Cooperative.    Labs and Imaging on Admission: I have personally reviewed following labs and imaging studies  CBC: Recent Labs  Lab 04/04/23 2138  WBC 11.0*  NEUTROABS 7.8*  HGB 11.3*  HCT 34.9*  MCV 86.6  PLT 210   Basic Metabolic Panel: Recent Labs  Lab 04/04/23 2138  NA 137  K 3.2*  CL 104  CO2 18*  GLUCOSE 159*  BUN 27*  CREATININE 1.87*  CALCIUM 9.1   GFR: CrCl cannot be calculated (Unknown ideal weight.). Liver Function Tests: Recent Labs  Lab 04/04/23 2138  AST 185*  ALT 137*  ALKPHOS 646*  BILITOT 13.1*  PROT 7.1  ALBUMIN 3.0*   No results for input(s): "LIPASE", "AMYLASE" in the last 168 hours. No results for input(s): "AMMONIA" in the last 168 hours. Coagulation Profile: No results for input(s): "INR", "PROTIME" in the last 168 hours. Cardiac Enzymes: No results for input(s): "CKTOTAL", "CKMB", "CKMBINDEX", "TROPONINI" in the last 168 hours. BNP (last 3 results) No results for input(s): "PROBNP" in the last 8760 hours. HbA1C: No results for  input(s): "HGBA1C" in the last 72 hours. CBG: No results for input(s): "GLUCAP" in the last 168 hours. Lipid Profile: No results for input(s): "CHOL", "HDL", "LDLCALC", "TRIG", "CHOLHDL", "LDLDIRECT" in the last 72 hours. Thyroid Function Tests: No results for input(s): "TSH", "T4TOTAL", "FREET4", "T3FREE", "THYROIDAB" in the last 72 hours. Anemia Panel: No results for input(s): "VITAMINB12", "FOLATE", "FERRITIN", "TIBC", "IRON", "RETICCTPCT" in the last 72 hours. Urine analysis:    Component Value Date/Time   COLORURINE AMBER (A) 04/04/2023 2140   APPEARANCEUR HAZY (A) 04/04/2023 2140   LABSPEC 1.007 04/04/2023 2140   PHURINE 5.0 04/04/2023 2140   GLUCOSEU >=500 (A) 04/04/2023 2140   HGBUR MODERATE (A) 04/04/2023 2140   BILIRUBINUR NEGATIVE 04/04/2023 2140   KETONESUR NEGATIVE 04/04/2023 2140   PROTEINUR 30 (A) 04/04/2023 2140   NITRITE NEGATIVE 04/04/2023 2140   LEUKOCYTESUR NEGATIVE 04/04/2023 2140   Sepsis Labs: @LABRCNTIP (procalcitonin:4,lacticidven:4) )No results found for this or any previous visit (from the past 240 hour(s)).   Radiological Exams on Admission: CT ABDOMEN PELVIS WO CONTRAST  Result Date: 04/04/2023 CLINICAL DATA:  Jaundice, concern for malignancy. EXAM: CT ABDOMEN AND PELVIS WITHOUT CONTRAST TECHNIQUE: Multidetector CT imaging of the abdomen and pelvis was performed following the standard protocol without IV contrast. RADIATION DOSE REDUCTION: This exam was performed according to the departmental dose-optimization program which includes automated exposure control, adjustment of the mA and/or kV according to patient size and/or use of iterative reconstruction technique. COMPARISON:  MRI abdomen 08/30/2022.  CT abdomen 08/14/2022. FINDINGS: Lower chest: No acute abnormality. Hepatobiliary: There some ill-defined heterogeneous hypodensity in the region of the gallbladder fossa measuring 2.4 x 3.0 cm this appears similar to the prior study given lack of contrast.  Gallbladder is surgically absent. There is no biliary ductal dilatation.  Pancreas: Unremarkable. No pancreatic ductal dilatation or surrounding inflammatory changes. Spleen: Normal in size without focal abnormality. Adrenals/Urinary Tract: There is a 3 mm calculus in the right kidney. There is no hydronephrosis or perinephric fat stranding. Left kidney, ureters and bladder are normal. Stomach/Bowel: Stomach is within normal limits. No evidence of bowel wall thickening, distention, or inflammatory changes. The appendix is not seen. There is a large amount of stool throughout colon. Vascular/Lymphatic: There severe atherosclerotic calcifications of the aorta, iliac arteries and branch vessels. There are no enlarged lymph nodes identified. Vascular stents are seen in the femoral regions along with postsurgical change and scarring bilaterally. Reproductive: Uterus and bilateral adnexa are unremarkable. Other: No abdominal wall hernia or abnormality. No abdominopelvic ascites. Musculoskeletal: There are few scattered rounded lucent areas in the vertebral bodies particularly at L2. These are similar to prior. IMPRESSION: 1. Ill-defined hypodensity in the region of the gallbladder fossa appears similar to the prior study given lack of contrast. This would be better evaluated with dedicated MRI. 2. Nonobstructing right renal calculus. 3. Large amount of stool throughout the colon. 4. Stable indeterminate lucent areas in the vertebral bodies of the lumbar spine. 5. Severe atherosclerotic disease. Aortic Atherosclerosis (ICD10-I70.0). Electronically Signed   By: Darliss Cheney M.D.   On: 04/04/2023 23:19     Assessment/Plan   1. Elevated LFTs  - Presents with painless jaundice and 1 wk fatigue, loss of appetite, and bloating  - Alk phos is 646, AST 185, ALT 137, and t bili 13.1 on admission  - Ill-defined hypodensity in region of gallbladder fossa noted on CT in ED and appears similar to study from December 2023  -  Check MRI, lipase, and INR, repeat CMP in am    2. AKI superimposed on CKD 3B  - SCr is 1.87 on admission, up from 1.21 last month  - No hydronephrosis on CT in ED  - Hold hydrochlorothiazide, ARB, Jardiance, and metformin, continue IVF hydration, renally-dose medications, repeat chem panel in am   3. CAD  - No anginal complaints  - Continue Xarelto and metoprolol, hold Lipitor while trending LFTs  4. PAD  - No acute ischemia   - Continue Xarelto, hold Lipitor for now  5. Hypertension  - Hold hydrochlorothiazide and ARB as above, continue metoprolol as tolerated   6. Type II DM  - A1c was 7.9% in June 2024  - Check CBGs and use SSI only for now     DVT prophylaxis: SCDs  Code Status: Full  Level of Care: Level of care: Med-Surg Family Communication: Daughter at bedside   Disposition Plan:  Patient is from: Home  Anticipated d/c is to: TBD Anticipated d/c date is: 04/08/23  Patient currently: Pending MRI, trend in LFTs  Consults called: none  Admission status: Inpatient     Briscoe Deutscher, MD [facility]  04/05/2023, 12:29 AM

## 2023-04-05 NOTE — Progress Notes (Addendum)
[facility] night cross cover note:   I was notified by RN for the patient's request for resumption of her home eyedrops, brimonidine, which I subsequently reordered for her.  Additionally, and conveys that the patient's systolic blood pressures currently in the 180s, with heart rates in the 70s to 80s.  She has been receiving metoprolol tartrate 25 mg p.o. twice daily in the hospital, while the patient conveys that as an outpatient, she is on metoprolol to tartrate 37.5 mg p.o. twice daily.  She has already received her evening dose of metoprolol tartrate 25 mg.  Socially modified her existing order for metoprolol to tartrate 25 mg p.o. twice daily to reflect her home dosing of 37.5 mg p.o. twice daily, and placed a one-time order for metoprolol to tartrate 12.5 mg p.o. x 1 dose now.  Additionally, I placed order for prn IV hydralazine for systolic blood pressures greater than 180.  Of note, patient also refusing her home Xarelto this evening.    Newton Pigg, DO Hospitalist

## 2023-04-05 NOTE — ED Notes (Signed)
Per CBG check, provided patient with sandwich meal and juice with crackers at this time. Asymptomatic at this time. Sitting up in bed eating food provided.

## 2023-04-05 NOTE — Consult Note (Signed)
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 A sacral foam is to be placed and heels floated for PI prevention.  WOC nursing team will not follow, but will remain available to this patient, the nursing and medical teams.  Please re-consult if needed.  Thank you for inviting [place] to participate in this patient's Plan of Care.  Ladona Mow, MSN, RN, CNS, GNP, Leda Min, [employer], [employer] phone:  [phone]

## 2023-04-05 NOTE — ED Notes (Signed)
ED TO INPATIENT HANDOFF REPORT  ED Nurse Name and Phone #: Delfin Edis / [phone]  S Name/Age/Gender [name] 82 y.o. female Room/Bed: 027C/027C  Code Status   Code Status: Full Code  Home/SNF/Other Home Patient oriented to: self, place, time, and situation Is this baseline? Yes   Triage Complete: Triage complete  Chief Complaint Jaundice [R17]  Triage Note Patient reports jaundice and bilateral icteric sclera onset yesterday .    Allergies Allergies  Allergen Reactions   Versed [Midazolam] Anxiety    Frantic, out of my mind, agitated    Augmentin [Amoxicillin-Pot Clavulanate] Other (See Comments)    Elevates potassium level   Bactrim [Sulfamethoxazole-Trimethoprim] Other (See Comments)    ^ K+( elevated)    Demerol [Meperidine]     Delusional    Meperidine Hcl     Other reaction(s): delusional   Scopolamine     Delusional    Tramadol Other (See Comments)    Keeps awake    Level of Care/Admitting Diagnosis ED Disposition     ED Disposition  Admit   Condition  --   Comment  Hospital Area: [facility] [[id number]]  Level of Care: Med-Surg [16]  May admit patient to [facility] or [name] if equivalent level of care is available:: Yes  Covid Evaluation: Asymptomatic - no recent exposure (last 10 days) testing not required  Diagnosis: Jaundice [[id number]]  Admitting Physician: Briscoe Deutscher [[id number]]  Attending Physician: Briscoe Deutscher [[id number]]  Certification:: I certify this patient will need inpatient services for at least 2 midnights  Estimated Length of Stay: 3          B Medical/Surgery History Past Medical History:  Diagnosis Date   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 There is a small calcified mass in the L:A near the LA appendage. No valvular masses seen with associated mitral annular calcification. LA Volume/ BSA27.4 ml/m2 No AS. Right ventricular systolic pressure is elevated at .   Myocardial infarction [facility])    June 2021   Neuromuscular disorder (HCC)    neuropathy in bilateral feet   Peripheral vascular disease (HCC)    Pneumonia    not hosp.    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  PTCAs with drug coated balloon Lt ant tibial artery and Lt popliteal artery  11/26/2019   Past Surgical History:  Procedure Laterality Date   ABDOMINAL AORTAGRAM  11/25/2019   ABDOMINAL AORTOGRAM W/LOWER EXTREMITY    ABDOMINAL AORTOGRAM N/A 06/15/2019   Procedure: ABDOMINAL AORTOGRAM;  Surgeon: Runell Gess, MD;  [facility] INV[facility];  Service: Cardiovascular;  Laterality: N/A;   ABDOMINAL AORTOGRAM W/LOWER EXTREMITY N/A 11/25/2019   Procedure: ABDOMINAL AORTOGRAM W/LOWER EXTREMITY;  Surgeon: Iran Ouch, MD;  [facility]: [facility];  Service: Cardiovascular;  Laterality: N/A;  Lt leg   ABDOMINAL AORTOGRAM W/LOWER EXTREMITY  N/A 08/01/2020   Procedure: ABDOMINAL AORTOGRAM W/LOWER EXTREMITY;  Surgeon: Runell Gess, MD;  [facility]: [facility] INVASIVE CV LAB;  Service: Cardiovascular;  Laterality: N/A;   ABDOMINAL AORTOGRAM W/LOWER EXTREMITY N/A 04/28/2021   Procedure: ABDOMINAL AORTOGRAM W/LOWER EXTREMITY;  Surgeon: Sherren Kerns, MD;  [facility]: [facility];  Service: Cardiovascular;  Laterality: N/A;   ABDOMINAL AORTOGRAM W/LOWER EXTREMITY N/A 10/13/2021   Procedure: ABDOMINAL AORTOGRAM W/LOWER EXTREMITY;  Surgeon: Leonie Douglas, MD;  [facility];   Service: Cardiovascular;  Laterality: N/A;   ABDOMINAL AORTOGRAM W/LOWER EXTREMITY N/A 11/09/2022   Procedure: ABDOMINAL AORTOGRAM W/LOWER EXTREMITY;  Surgeon: Leonie Douglas, MD;  [facility];  Service: Cardiovascular;  Laterality: N/A;   ABDOMINAL AORTOGRAM W/LOWER EXTREMITY N/A 11/16/2022   Procedure: ABDOMINAL AORTOGRAM W/LOWER EXTREMITY;  Surgeon: Leonie Douglas, MD;  [facility];  Service: Cardiovascular;  Laterality: N/A;   AMPUTATION TOE Left 05/03/2021   Procedure: AMPUTATION OF THIRD LEFT  TOE;  Surgeon: Leonie Douglas, MD;  Location: [facility] OR;  Service: Vascular;  Laterality: Left;   APPENDECTOMY     APPLICATION OF WOUND VAC Right 07/21/2019   Procedure: Application Of Prevena Wound Vac Right Groin;  Surgeon: Nada Libman, MD;  [facility];  Service: Vascular;  Laterality: Right;   CARPAL TUNNEL RELEASE Left    CARPAL TUNNEL RELEASE Right    CESAREAN SECTION     x 2   CHOLECYSTECTOMY N/A 10/12/2022   Procedure: LAPAROSCOPIC PARTIAL FENESTRATING CHOLECYSTECTOMY;  Surgeon: Fritzi Mandes, MD;  [facility];  Service: General;  Laterality: N/A;   COLONOSCOPY     CORONARY ARTERY BYPASS GRAFT N/A 03/24/2020   Procedure: CORONARY ARTERY BYPASS GRAFTING (CABG) using LIMA to LAD (m); RIMA to RAMUS; Endoscopic Right Greater Saphenous Vein: SVG to Diag1; SVG to PLB (right); and SVG to PL (left).;  Surgeon: Linden Dolin, MD;  [facility] OR;  Service: Open Heart Surgery;  Laterality: N/A;  BILATERAL IMA   ENDARTERECTOMY FEMORAL Right 07/21/2019   Procedure: RIGHT ENDARTERECTOMY FEMORAL WITH PATCH ANGIOPLASTY;  Surgeon: Nada Libman, MD;  [facility];  Service: Vascular;  Laterality: Right;   ENDARTERECTOMY FEMORAL Right 08/16/2020   Procedure: RIGHT FEMORAL ENDARTERECTOMY;  Surgeon: Maeola Harman, MD;  Location: [facility] OR;  Service: Vascular;  Laterality: Right;   ENDOVEIN HARVEST OF GREATER SAPHENOUS VEIN Right 03/24/2020    Procedure: ENDOVEIN HARVEST OF GREATER SAPHENOUS VEIN;  Surgeon: Linden Dolin, MD;  [facility];  Service: Open Heart Surgery;  Laterality: Right;   EYE SURGERY     cataract removal bilaterally   FEMORAL-POPLITEAL BYPASS GRAFT Right 08/16/2020   Procedure: BYPASS GRAFT FEMORAL-POPLITEAL ARTERY;  Surgeon: Maeola Harman, MD;  Location: [facility] OR;  Service: Vascular;  Laterality: Right;   FEMORAL-POPLITEAL BYPASS GRAFT Left 05/03/2021   Procedure: LEFT FEMORAL TO BELOW KNEE POPLITEAL ARTERY BYPASS GRAFTING WITH 6MMX80 PTFE GRAFT;  Surgeon: Leonie Douglas, MD;  [facility];  Service: Vascular;  Laterality: Left;   LEFT HEART CATH AND CORONARY ANGIOGRAPHY N/A 03/22/2020   Procedure: LEFT HEART CATH AND CORONARY ANGIOGRAPHY;  Surgeon: Swazil[place], Peter M, MD;  Location: [facility] INVASIVE CV LAB;  Service: Cardiovascular;  Laterality: N/A;   LOWER EXTREMITY ANGIOGRAPHY Bilateral 06/15/2019   Procedure: Lower Extremity Angiography;  Surgeon: Runell Gess, MD;  Location: [facility] INVASIVE CV LAB;  Service: Cardiovascular;  Laterality: Bilateral;   LOWER EXTREMITY ANGIOGRAPHY Right  08/03/2019   Procedure: LOWER EXTREMITY ANGIOGRAPHY;  Surgeon: Runell Gess, MD;  Location: [facility] INVASIVE CV LAB;  Service: Cardiovascular;  Laterality: Right;   LOWER EXTREMITY ANGIOGRAPHY N/A 09/07/2021   Procedure: LOWER EXTREMITY ANGIOGRAPHY;  Surgeon: Cephus Shelling, MD;  [facility];  Service: Cardiovascular;  Laterality: N/A;   PATCH ANGIOPLASTY Right 07/21/2019   Procedure: Patch Angioplasty Right Femoral Artery;  Surgeon: Nada Libman, MD;  Location: [facility] OR;  Service: Vascular;  Laterality: Right;   PERIPHERAL INTRAVASCULAR LITHOTRIPSY  11/25/2019   Procedure: INTRAVASCULAR LITHOTRIPSY;  Surgeon: Iran Ouch, MD;  [facility];  Service: Cardiovascular;;  LT. SFA   PERIPHERAL VASCULAR ATHERECTOMY  08/03/2019   Procedure: PERIPHERAL VASCULAR ATHERECTOMY;   Surgeon: Runell Gess, MD;  Location: [facility] INVASIVE CV LAB;  Service: Cardiovascular;;  right SFA, right TP trunk   PERIPHERAL VASCULAR ATHERECTOMY  11/25/2019   Procedure: PERIPHERAL VASCULAR ATHERECTOMY;  Surgeon: Iran Ouch, MD;  [facility];  Service: Cardiovascular;;  Lt.  POPLITEAL and AT   PERIPHERAL VASCULAR BALLOON ANGIOPLASTY Left 06/15/2019   Procedure: PERIPHERAL VASCULAR BALLOON ANGIOPLASTY;  Surgeon: Runell Gess, MD;  [facility];  Service: Cardiovascular;  Laterality: Left;  SFA UNSUCCESSFUL UNABLE TO CROSS LESION   PERIPHERAL VASCULAR BALLOON ANGIOPLASTY  08/03/2019   Procedure: PERIPHERAL VASCULAR BALLOON ANGIOPLASTY;  Surgeon: Runell Gess, MD;  [facility];  Service: Cardiovascular;;  right SFA, Right TP trunk   PERIPHERAL VASCULAR BALLOON ANGIOPLASTY  09/07/2021   Procedure: PERIPHERAL VASCULAR BALLOON ANGIOPLASTY;  Surgeon: Cephus Shelling, MD;  [facility];  Service: Cardiovascular;;   PERIPHERAL VASCULAR BALLOON ANGIOPLASTY Right 10/13/2021   Procedure: PERIPHERAL VASCULAR BALLOON ANGIOPLASTY;  Surgeon: Leonie Douglas, MD;  [facility];  Service: Cardiovascular;  Laterality: Right;   PERIPHERAL VASCULAR BALLOON ANGIOPLASTY  11/09/2022   Procedure: PERIPHERAL VASCULAR BALLOON ANGIOPLASTY;  Surgeon: Leonie Douglas, MD;  [facility];  Service: Cardiovascular;;  fem-pop bypass and AT   TEE WITHOUT CARDIOVERSION N/A 03/24/2020   Procedure: TRANSESOPHAGEAL ECHOCARDIOGRAM (TEE);  Surgeon: Linden Dolin, MD;  Location: [facility] OR;  Service: Open Heart Surgery;  Laterality: N/A;   TONSILLECTOMY     and adenoidectomy   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/2019   Procedure: TRANSMETATARSAL AMPUTATION;  Surgeon: Vivi Barrack, DPM;  [facility];  Service: Podiatry;  Laterality: Right;   TRANSMETATARSAL AMPUTATION Left 05/11/2021   Procedure: LEFT  TRANSMETATARSAL AMPUTATION;  Surgeon: Maeola Harman, MD;  Location: [facility] OR;  Service: Vascular;  Laterality: Left;   TUBAL LIGATION       A IV Location/Drains/Wounds Patient Lines/Drains/Airways Status     Active Line/Drains/Airways     Name Placement date Placement time Site Days   Peripheral IV 04/04/23 20 G Anterior;Right Forearm 04/04/23  2238  Forearm  1   Sheath 11/16/22 Left Femoral;Arterial 11/16/22  1041  Femoral;Arterial  140   Closed System Drain 1 Right;Lateral Abdomen Bulb (JP) 19 Fr. 10/12/22  0959  Abdomen  175   Incision - 3 Ports Abdomen 1: Umbilicus 2: Mid;Upper 3: Right;Upper;Medial 10/12/22  0902  -- 175            Intake/Output Last 24 hours No intake or output data in the 24 hours ending 04/05/23 0135  Labs/Imaging Results for orders placed or performed during the hospital encounter of 04/04/23 (from the past 48  hour(s))  CBC with Differential     Status: Abnormal   Collection Time: 04/04/23  9:38 PM  Result Value Ref Range   WBC 11.0 (H) 4.0 - 10.5 K/uL   RBC 4.03 3.87 - 5.11 MIL/uL   Hemoglobin 11.3 (L) 12.0 - 15.0 g/dL   HCT 16.1 (L) 09.6 - 04.5 %   MCV 86.6 80.0 - 100.0 fL   MCH 28.0 26.0 - 34.0 pg   MCHC 32.4 30.0 - 36.0 g/dL   RDW 40.9 (H) 81.1 - 91.4 %   Platelets 210 150 - 400 K/uL   nRBC 0.0 0.0 - 0.2 %   Neutrophils Relative % 71 %   Neutro Abs 7.8 (H) 1.7 - 7.7 K/uL   Lymphocytes Relative 15 %   Lymphs Abs 1.6 0.7 - 4.0 K/uL   Monocytes Relative 12 %   Monocytes Absolute 1.3 (H) 0.1 - 1.0 K/uL   Eosinophils Relative 1 %   Eosinophils Absolute 0.1 0.0 - 0.5 K/uL   Basophils Relative 1 %   Basophils Absolute 0.1 0.0 - 0.1 K/uL   Immature Granulocytes 0 %   Abs Immature Granulocytes 0.04 0.00 - 0.07 K/uL    Comment: Performed at [facility], [address], [place], [place] [address]  Comprehensive metabolic panel     Status: Abnormal   Collection Time: 04/04/23  9:38 PM  Result Value Ref Range   Sodium 137 135 -  145 mmol/L   Potassium 3.2 (L) 3.5 - 5.1 mmol/L   Chloride 104 98 - 111 mmol/L   CO2 18 (L) 22 - 32 mmol/L   Glucose, Bld 159 (H) 70 - 99 mg/dL    Comment: Glucose reference range applies only to samples taken after fasting for at least 8 hours.   BUN 27 (H) 8 - 23 mg/dL   Creatinine, Ser 6.21 (H) 0.44 - 1.00 mg/dL   Calcium 9.1 8.9 - 30.8 mg/dL   Total Protein 7.1 6.5 - 8.1 g/dL   Albumin 3.0 (L) 3.5 - 5.0 g/dL   AST 657 (H) 15 - 41 U/L   ALT 137 (H) 0 - 44 U/L   Alkaline Phosphatase 646 (H) 38 - 126 U/L   Total Bilirubin 13.1 (H) 0.3 - 1.2 mg/dL   GFR, Estimated 27 (L) >60 mL/min    Comment: (NOTE) Calculated using the CKD-EPI Creatinine Equation (2021)    Anion gap 15 5 - 15    Comment: Performed at [facility], [address], [place], [place] [address]  Urinalysis, Routine w reflex microscopic -Urine, Clean Catch     Status: Abnormal   Collection Time: 04/04/23  9:40 PM  Result Value Ref Range   Color, Urine AMBER (A) YELLOW    Comment: BIOCHEMICALS MAY BE AFFECTED BY COLOR   APPearance HAZY (A) CLEAR   Specific Gravity, Urine 1.007 1.005 - 1.030   pH 5.0 5.0 - 8.0   Glucose, UA >=500 (A) NEGATIVE mg/dL   Hgb urine dipstick MODERATE (A) NEGATIVE   Bilirubin Urine NEGATIVE NEGATIVE   Ketones, ur NEGATIVE NEGATIVE mg/dL   Protein, ur 30 (A) NEGATIVE mg/dL   Nitrite NEGATIVE NEGATIVE   Leukocytes,Ua NEGATIVE NEGATIVE   RBC / HPF 0-5 0 - 5 RBC/hpf   WBC, UA 0-5 0 - 5 WBC/hpf   Bacteria, UA RARE (A) NONE SEEN   Squamous Epithelial / HPF 0-5 0 - 5 /HPF   Mucus PRESENT     Comment: Performed at [facility], [address], [place], [place] [address]  Hepatitis panel, acute     Status: None   Collection Time: 04/04/23 10:45 PM  Result Value Ref Range   Hepatitis B Surface Ag NON REACTIVE NON REACTIVE   HCV Ab NON REACTIVE NON REACTIVE    Comment: (NOTE) Nonreactive HCV antibody screen is consistent with no HCV infections,  unless recent infection is  suspected or other evidence exists to indicate HCV infection.     Hep A IgM NON REACTIVE NON REACTIVE   Hep B C IgM NON REACTIVE NON REACTIVE    Comment: Performed at [facility], [address] [place], [place] [address]   CT ABDOMEN PELVIS WO CONTRAST  Result Date: 04/04/2023 CLINICAL DATA:  Jaundice, concern for malignancy. EXAM: CT ABDOMEN AND PELVIS WITHOUT CONTRAST TECHNIQUE: Multidetector CT imaging of the abdomen and pelvis was performed following the standard protocol without IV contrast. RADIATION DOSE REDUCTION: This exam was performed according to the departmental dose-optimization program which includes automated exposure control, adjustment of the mA and/or kV according to patient size and/or use of iterative reconstruction technique. COMPARISON:  MRI abdomen 08/30/2022.  CT abdomen 08/14/2022. FINDINGS: Lower chest: No acute abnormality. Hepatobiliary: There some ill-defined heterogeneous hypodensity in the region of the gallbladder fossa measuring 2.4 x 3.0 cm this appears similar to the prior study given lack of contrast. Gallbladder is surgically absent. There is no biliary ductal dilatation. Pancreas: Unremarkable. No pancreatic ductal dilatation or surrounding inflammatory changes. Spleen: Normal in size without focal abnormality. Adrenals/Urinary Tract: There is a 3 mm calculus in the right kidney. There is no hydronephrosis or perinephric fat stranding. Left kidney, ureters and bladder are normal. Stomach/Bowel: Stomach is within normal limits. No evidence of bowel wall thickening, distention, or inflammatory changes. The appendix is not seen. There is a large amount of stool throughout colon. Vascular/Lymphatic: There severe atherosclerotic calcifications of the aorta, iliac arteries and branch vessels. There are no enlarged lymph nodes identified. Vascular stents are seen in the femoral regions along with postsurgical change and scarring bilaterally. Reproductive: Uterus and  bilateral adnexa are unremarkable. Other: No abdominal wall hernia or abnormality. No abdominopelvic ascites. Musculoskeletal: There are few scattered rounded lucent areas in the vertebral bodies particularly at L2. These are similar to prior. IMPRESSION: 1. Ill-defined hypodensity in the region of the gallbladder fossa appears similar to the prior study given lack of contrast. This would be better evaluated with dedicated MRI. 2. Nonobstructing right renal calculus. 3. Large amount of stool throughout the colon. 4. Stable indeterminate lucent areas in the vertebral bodies of the lumbar spine. 5. Severe atherosclerotic disease. Aortic Atherosclerosis (ICD10-I70.0). Electronically Signed   By: Darliss Cheney M.D.   On: 04/04/2023 23:19    Pending Labs Unresulted Labs (From admission, onward)     Start     Ordered   04/05/23 0500  Comprehensive metabolic panel  Daily,   R      04/05/23 0029   04/05/23 0500  Magnesium  Tomorrow morning,   R        04/05/23 0029   04/05/23 0500  CBC  Daily,   R      04/05/23 0029   04/05/23 0500  Lipase, blood  Tomorrow morning,   R        04/05/23 0029   04/05/23 0500  Protime-INR  Tomorrow morning,   R        04/05/23 0029  Vitals/Pain Today's Vitals   04/04/23 2129 04/04/23 2131  BP: (!) 176/79   Pulse: 92   Resp: 18   Temp: 98.1 F (36.7 C)   SpO2: 98%   PainSc:  0-No pain    Isolation Precautions No active isolations  Medications Medications  metoprolol tartrate (LOPRESSOR) tablet 25 mg (has no administration in time range)  pantoprazole (PROTONIX) EC tablet 40 mg (has no administration in time range)  rivaroxaban (XARELTO) tablet 2.5 mg (has no administration in time range)  insulin aspart (novoLOG) injection 0-6 Units (has no administration in time range)  acetaminophen (TYLENOL) tablet 325 mg (has no administration in time range)  senna-docusate (Senokot-S) tablet 1 tablet (has no administration in time range)  ondansetron  (ZOFRAN) tablet 4 mg (has no administration in time range)    Or  ondansetron (ZOFRAN) injection 4 mg (has no administration in time range)  0.9 %  sodium chloride infusion (has no administration in time range)  potassium chloride SA (KLOR-CON M) CR tablet 20 mEq (has no administration in time range)  sodium chloride 0.9 % bolus 1,000 mL (0 mLs Intravenous Stopped 04/05/23 0017)    Mobility walks     Focused Assessments Cardiac Assessment Handoff:    No results found for: "CKTOTAL", "CKMB", "CKMBINDEX", "TROPONINI" No results found for: "DDIMER" Does the Patient currently have chest pain? No    R Recommendations: See Admitting Provider Note  Report given to:   Additional Notes:

## 2023-04-05 NOTE — Consult Note (Signed)
Chief Complaint: Liver lesion  Referring Provider(s): Lanae Boast  Supervising Physician: Richarda Overlie  Patient Status: [facility] - In-pt  History of Present Illness: [name] is a 82 y.o. female with medical issues including PVD, HTN, HLD, GERD, DM, and skin cancer.   She presented to the ED at [facility] on 04/04/23 with jaundice.   CT abd pelvis done 04/04/23 showed ill defined hypodensity in the region of the gallbladder fossa, appears similar to the prior study given lack of contrast.   MRI from today reads = Unusual but increasingly aggressive and mass-like appearing lesion in segment 5 of the liver adjacent to the gallbladder fossa, concerning for potential neoplasm. Tissue sampling should be considered to establish a tissue diagnosis. Further evaluation with PET-CT should also be considered.   Potassium 2.9, BUN 25, Cr 1.75, alkaline phosphatase 550, Albumin 2.5, AST 163. Patient is on Xarelto. Currently on hold. I would need to confirm if she took it yesterday before she came into the ED.   Patient is Full Code  Past Medical History:  Diagnosis Date   Anemia    after CABG in june 2021   Arthritis    Cancer (HCC)    removal from nose - MOSE procedure    Complication of anesthesia    VERSED- agitated, muscle spasms, "jerking" , frantic , (never had this occurence in the pas)    Coronary artery disease    Diabetes mellitus without complication (HCC)    Dysrhythmia    PVC's   GERD (gastroesophageal reflux disease)    Heart murmur    History of hiatal hernia    Hyperlipidemia    Hypertension 11/20/11   ECHO- EF>55% Borderline concentric left ventricular hypertrophy. There is a small calcified mass in the L:A near the LA appendage. No valvular masses seen with associated mitral annular calcification. LA Volume/ BSA27.4 ml/m2 No AS. Right ventricular systolic pressure is elevated at .   Myocardial infarction [facility])    June 2021   Neuromuscular disorder (HCC)     neuropathy in bilateral feet   Peripheral vascular disease (HCC)    Pneumonia    not hosp.    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  PTCAs with drug coated balloon Lt ant tibial artery and Lt popliteal artery  11/26/2019    Past Surgical History:  Procedure Laterality Date   ABDOMINAL AORTAGRAM  11/25/2019   ABDOMINAL AORTOGRAM W/LOWER EXTREMITY    ABDOMINAL AORTOGRAM N/A 06/15/2019   Procedure: ABDOMINAL AORTOGRAM;  Surgeon: Runell Gess, MD;  [facility]: [facility] INV[facility];  Service: Cardiovascular;  Laterality: N/A;   ABDOMINAL AORTOGRAM W/LOWER EXTREMITY N/A 11/25/2019   Procedure: ABDOMINAL AORTOGRAM W/LOWER EXTREMITY;  Surgeon: Iran Ouch, MD;  [facility]: [facility];  Service: Cardiovascular;  Laterality: N/A;  Lt leg   ABDOMINAL AORTOGRAM W/LOWER EXTREMITY N/A 08/01/2020   Procedure: ABDOMINAL AORTOGRAM W/LOWER EXTREMITY;  Surgeon: Runell Gess, MD;  [facility]: [facility];  Service: Cardiovascular;  Laterality: N/A;   ABDOMINAL AORTOGRAM W/LOWER EXTREMITY N/A 04/28/2021   Procedure: ABDOMINAL AORTOGRAM W/LOWER EXTREMITY;  Surgeon: Sherren Kerns, MD;  [facility]: [facility];  Service: Cardiovascular;  Laterality: N/A;   ABDOMINAL AORTOGRAM W/LOWER EXTREMITY N/A 10/13/2021   Procedure: ABDOMINAL AORTOGRAM W/LOWER EXTREMITY;  Surgeon: Leonie Douglas, MD;  [facility];  Service: Cardiovascular;  Laterality: N/A;   ABDOMINAL AORTOGRAM W/LOWER EXTREMITY N/A 11/09/2022   Procedure: ABDOMINAL AORTOGRAM  W/LOWER EXTREMITY;  Surgeon: Leonie Douglas, MD;  [facility];  Service: Cardiovascular;  Laterality: N/A;   ABDOMINAL AORTOGRAM W/LOWER EXTREMITY N/A 11/16/2022   Procedure: ABDOMINAL AORTOGRAM W/LOWER EXTREMITY;  Surgeon: Leonie Douglas, MD;  [facility];  Service: Cardiovascular;  Laterality: N/A;   AMPUTATION TOE Left 05/03/2021   Procedure: AMPUTATION OF THIRD LEFT  TOE;  Surgeon:  Leonie Douglas, MD;  Location: [facility] OR;  Service: Vascular;  Laterality: Left;   APPENDECTOMY     APPLICATION OF WOUND VAC Right 07/21/2019   Procedure: Application Of Prevena Wound Vac Right Groin;  Surgeon: Nada Libman, MD;  [facility];  Service: Vascular;  Laterality: Right;   CARPAL TUNNEL RELEASE Left    CARPAL TUNNEL RELEASE Right    CESAREAN SECTION     x 2   CHOLECYSTECTOMY N/A 10/12/2022   Procedure: LAPAROSCOPIC PARTIAL FENESTRATING CHOLECYSTECTOMY;  Surgeon: Fritzi Mandes, MD;  [facility] OR;  Service: General;  Laterality: N/A;   COLONOSCOPY     CORONARY ARTERY BYPASS GRAFT N/A 03/24/2020   Procedure: CORONARY ARTERY BYPASS GRAFTING (CABG) using LIMA to LAD (m); RIMA to RAMUS; Endoscopic Right Greater Saphenous Vein: SVG to Diag1; SVG to PLB (right); and SVG to PL (left).;  Surgeon: Linden Dolin, MD;  [facility];  Service: Open Heart Surgery;  Laterality: N/A;  BILATERAL IMA   ENDARTERECTOMY FEMORAL Right 07/21/2019   Procedure: RIGHT ENDARTERECTOMY FEMORAL WITH PATCH ANGIOPLASTY;  Surgeon: Nada Libman, MD;  [facility];  Service: Vascular;  Laterality: Right;   ENDARTERECTOMY FEMORAL Right 08/16/2020   Procedure: RIGHT FEMORAL ENDARTERECTOMY;  Surgeon: Maeola Harman, MD;  Location: [facility] OR;  Service: Vascular;  Laterality: Right;   ENDOVEIN HARVEST OF GREATER SAPHENOUS VEIN Right 03/24/2020   Procedure: ENDOVEIN HARVEST OF GREATER SAPHENOUS VEIN;  Surgeon: Linden Dolin, MD;  [facility];  Service: Open Heart Surgery;  Laterality: Right;   EYE SURGERY     cataract removal bilaterally   FEMORAL-POPLITEAL BYPASS GRAFT Right 08/16/2020   Procedure: BYPASS GRAFT FEMORAL-POPLITEAL ARTERY;  Surgeon: Maeola Harman, MD;  Location: [facility] OR;  Service: Vascular;  Laterality: Right;   FEMORAL-POPLITEAL BYPASS GRAFT Left 05/03/2021   Procedure: LEFT FEMORAL TO BELOW KNEE POPLITEAL ARTERY BYPASS GRAFTING WITH 6MMX80 PTFE GRAFT;   Surgeon: Leonie Douglas, MD;  [facility];  Service: Vascular;  Laterality: Left;   LEFT HEART CATH AND CORONARY ANGIOGRAPHY N/A 03/22/2020   Procedure: LEFT HEART CATH AND CORONARY ANGIOGRAPHY;  Surgeon: Swaziland, Peter M, MD;  Location: [facility] INVASIVE CV LAB;  Service: Cardiovascular;  Laterality: N/A;   LOWER EXTREMITY ANGIOGRAPHY Bilateral 06/15/2019   Procedure: Lower Extremity Angiography;  Surgeon: Runell Gess, MD;  Location: [facility] INVASIVE CV LAB;  Service: Cardiovascular;  Laterality: Bilateral;   LOWER EXTREMITY ANGIOGRAPHY Right 08/03/2019   Procedure: LOWER EXTREMITY ANGIOGRAPHY;  Surgeon: Runell Gess, MD;  [facility];  Service: Cardiovascular;  Laterality: Right;   LOWER EXTREMITY ANGIOGRAPHY N/A 09/07/2021   Procedure: LOWER EXTREMITY ANGIOGRAPHY;  Surgeon: Cephus Shelling, MD;  [facility];  Service: Cardiovascular;  Laterality: N/A;   PATCH ANGIOPLASTY Right 07/21/2019   Procedure: Patch Angioplasty Right Femoral Artery;  Surgeon: Nada Libman, MD;  Location: [facility] OR;  Service: Vascular;  Laterality: Right;   PERIPHERAL INTRAVASCULAR LITHOTRIPSY  11/25/2019   Procedure: INTRAVASCULAR LITHOTRIPSY;  Surgeon: Iran Ouch, MD;  [facility];  Service: Cardiovascular;;  LT. SFA   PERIPHERAL VASCULAR ATHERECTOMY  08/03/2019   Procedure: PERIPHERAL VASCULAR ATHERECTOMY;  Surgeon: Runell Gess, MD;  Location: [facility] INVASIVE CV LAB;  Service: Cardiovascular;;  right SFA, right TP trunk   PERIPHERAL VASCULAR ATHERECTOMY  11/25/2019   Procedure: PERIPHERAL VASCULAR ATHERECTOMY;  Surgeon: Iran Ouch, MD;  [facility];  Service: Cardiovascular;;  Lt.  POPLITEAL and AT   PERIPHERAL VASCULAR BALLOON ANGIOPLASTY Left 06/15/2019   Procedure: PERIPHERAL VASCULAR BALLOON ANGIOPLASTY;  Surgeon: Runell Gess, MD;  [facility];  Service: Cardiovascular;  Laterality: Left;  SFA  UNSUCCESSFUL UNABLE TO CROSS LESION   PERIPHERAL VASCULAR BALLOON ANGIOPLASTY  08/03/2019   Procedure: PERIPHERAL VASCULAR BALLOON ANGIOPLASTY;  Surgeon: Runell Gess, MD;  [facility];  Service: Cardiovascular;;  right SFA, Right TP trunk   PERIPHERAL VASCULAR BALLOON ANGIOPLASTY  09/07/2021   Procedure: PERIPHERAL VASCULAR BALLOON ANGIOPLASTY;  Surgeon: Cephus Shelling, MD;  [facility];  Service: Cardiovascular;;   PERIPHERAL VASCULAR BALLOON ANGIOPLASTY Right 10/13/2021   Procedure: PERIPHERAL VASCULAR BALLOON ANGIOPLASTY;  Surgeon: Leonie Douglas, MD;  [facility];  Service: Cardiovascular;  Laterality: Right;   PERIPHERAL VASCULAR BALLOON ANGIOPLASTY  11/09/2022   Procedure: PERIPHERAL VASCULAR BALLOON ANGIOPLASTY;  Surgeon: Leonie Douglas, MD;  [facility];  Service: Cardiovascular;;  fem-pop bypass and AT   TEE WITHOUT CARDIOVERSION N/A 03/24/2020   Procedure: TRANSESOPHAGEAL ECHOCARDIOGRAM (TEE);  Surgeon: Linden Dolin, MD;  Location: [facility] OR;  Service: Open Heart Surgery;  Laterality: N/A;   TONSILLECTOMY     and adenoidectomy   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/2019   Procedure: TRANSMETATARSAL AMPUTATION;  Surgeon: Vivi Barrack, DPM;  [facility];  Service: Podiatry;  Laterality: Right;   TRANSMETATARSAL AMPUTATION Left 05/11/2021   Procedure: LEFT TRANSMETATARSAL AMPUTATION;  Surgeon: Maeola Harman, MD;  Location: [facility] OR;  Service: Vascular;  Laterality: Left;   TUBAL LIGATION      Allergies: Versed [midazolam], Augmentin [amoxicillin-pot clavulanate], Bactrim [sulfamethoxazole-trimethoprim], Demerol [meperidine], Meperidine hcl, Scopolamine, and Tramadol  Medications: Prior to Admission medications   Medication Sig Start Date End Date Taking? Authorizing Provider  acetaminophen (TYLENOL) 500 MG tablet Take 1 tablet (500 mg total) by mouth every 8 (eight) hours as  needed for mild pain. 10/12/22   Fritzi Mandes, MD  Ascorbic Acid (VITA-C PO)  03/10/23   [provider]  aspirin EC 81 MG tablet Take 81 mg by mouth at bedtime. Swallow whole.    [provider]  atorvastatin (LIPITOR) 80 MG tablet TAKE 1 TABLET BY MOUTH ONCE DAILY. MUST  KEEP  UPCOMING  APPOINTMENT  FOR  FUTURE  REFILLS 02/19/23   Runell Gess, MD  brimonidine [facility]) 0.2 % ophthalmic solution Place 1 drop into the right eye 2 (two) times daily. 01/19/21   [provider]  Cholecalciferol (VITAMIN D3 GUMMIES PO) Take 2 tablets by mouth at bedtime.    [provider]  CINNAMON PO Take 2,400 mg by mouth in the morning, at noon, and at bedtime. 1200 mg each Breakfast, supper & bedtime.    [provider]  Cyanocobalamin (B-12 PO) Take 1,000 mcg by mouth daily with supper.    [provider]  hydrochlorothiazide (HYDRODIURIL) 12.5 MG tablet Take 1 tablet by mouth once daily 03/19/23   Reather Littler, MD  Insulin Lispro Prot &  Lispro (HUMALOG MIX 75/25 KWIKPEN) (75-25) 100 UNIT/ML Kwikpen INJECT 15 UNITS SUBCUTANEOUSLY ONCE DAILY WITH BREAKFAST 03/19/23   Reather Littler, MD  Insulin Pen Needle (ULTRA-THIN II MINI PEN NEEDLE) 31G X 5 MM MISC USE AS DIRECTED 09/19/22   Reather Littler, MD  JARDIANCE 25 MG TABS tablet TAKE 1 TABLET BY MOUTH ONCE DAILY BEFORE BREAKFAST Patient taking differently: Take 25 mg by mouth See admin instructions. Take 1 tablet (25 mg) by mouth in the evening as needed with high carbohydrates evening meals 05/18/22   Reather Littler, MD  LAGEVRIO 200 MG CAPS capsule Take 4 capsules by mouth every 12 (twelve) hours. 10/31/22   [provider]  Lancets [facility] DELICA PLUS [place]) MISC Use to check blood sugars twice daily. Patient taking differently: Use to check blood sugars  daily. 04/24/21   Reather Littler, MD  Menatetrenone (VITAMIN K2) 100 MCG TABS Take 100 mcg by mouth at bedtime.    [provider]  metFORMIN  (GLUCOPHAGE) 1000 MG tablet Take 1 tablet by mouth twice daily 03/26/22   Runell Gess, MD  metoprolol tartrate (LOPRESSOR) 25 MG tablet TAKE 1 & 1/2 (ONE & ONE-HALF) TABLETS BY MOUTH TWICE DAILY 07/18/22   Runell Gess, MD  mupirocin ointment (BACTROBAN) 2 % Apply 1 Application topically 2 (two) times daily. 03/14/23   Vivi Barrack, DPM  ONETOUCH VERIO test strip USE TO CHECK BLOOD SUGARS ONCE DAILY 07/19/22   Reather Littler, MD  pantoprazole (PROTONIX) 40 MG tablet Take 1 tablet by mouth once daily Patient taking differently: Take 40 mg by mouth See admin instructions. Take Sun, Mon., Wed., and Friday in the evening 12/21/21   Runell Gess, MD  Probiotic Product ([employer] SENIOR PROBIOTIC PO) Take 1 capsule by mouth every evening.    [provider]  Propylene Glycol (SYSTANE BALANCE) 0.6 % SOLN Place 1 drop into the left eye 3 (three) times daily as needed (dry/irritated eyes).    [provider]  repaglinide (PRANDIN) 2 MG tablet Take 1 tab by breakfast and 1 tab before dinner Patient taking differently: Take 2 mg by mouth 2 (two) times daily before a meal. 12/18/22   Reather Littler, MD  rivaroxaban Carlena Hurl) 2.5 MG TABS tablet Take 1 tablet by mouth twice daily 02/19/23   Leonie Douglas, MD  telmisartan (MICARDIS) 80 MG tablet Take 1 tablet by mouth once daily 08/21/22   Runell Gess, MD     Family History  Problem Relation Age of Onset   Cancer - Prostate Father    Cancer - Colon Father    Stroke Mother    Hypertension Mother    Hyperlipidemia Mother    Melanoma Brother     Social History   Socioeconomic History   Marital status: Widowed    Spouse name: Not on file   Number of children: 3   Years of education: Not on file   Highest education level: Not on file  Occupational History   Not on file  Tobacco Use   Smoking status: Former    Current packs/day: 0.00    Types: Cigarettes    Quit date: 03/31/1972    Years since quitting: 51.0    Smokeless tobacco: Never  Vaping Use   Vaping status: Never Used  Substance and Sexual Activity   Alcohol use: No   Drug use: No   Sexual activity: Not on file  Other Topics Concern   Not on file  Social History Narrative  Not on file   Social Determinants of Health   Financial Resource Strain: Not on file  Food Insecurity: No Food Insecurity (04/05/2023)   Hunger Vital Sign    Worried About Running Out of Food in the Last Year: Never true    Ran Out of Food in the Last Year: Never true  Transportation Needs: No Transportation Needs (04/05/2023)   PRAPARE - Administrator, Civil Service (Medical): No    Lack of Transportation (Non-Medical): No  Physical Activity: Not on file  Stress: Not on file  Social Connections: Unknown (02/02/2022)   Received from [facility]   Social Network    Social Network: Not on file     Review of Systems: A 12 point ROS discussed and pertinent positives are indicated in the HPI above.  All other systems are negative.  Review of Systems  Vital Signs: BP (!) 176/69 (BP Location: Left Arm)   Pulse 100   Temp 97.6 F (36.4 C) (Oral)   Resp 18   Ht 5\' 5"  (1.651 m)   Wt 144 lb 12.8 oz (65.7 kg)   SpO2 99%   BMI 24.10 kg/m   Advance Care Plan: The advanced care place/surrogate decision maker was discussed at the time of visit and the patient did not wish to discuss or was not able to name a surrogate decision maker or provide an advance care plan.  Physical Exam Vitals reviewed.  Constitutional:      Appearance: Normal appearance.  HENT:     Head: Normocephalic and atraumatic.  Eyes:     Extraocular Movements: Extraocular movements intact.  Cardiovascular:     Rate and Rhythm: Normal rate and regular rhythm.  Pulmonary:     Effort: Pulmonary effort is normal. No respiratory distress.     Breath sounds: Normal breath sounds.  Abdominal:     Palpations: Abdomen is soft.  Musculoskeletal:        General: Normal range of  motion.     Cervical back: Normal range of motion.  Skin:    General: Skin is warm and dry.  Neurological:     General: No focal deficit present.     Mental Status: She is alert and oriented to person, place, and time.  Psychiatric:        Mood and Affect: Mood normal.        Behavior: Behavior normal.        Thought Content: Thought content normal.        Judgment: Judgment normal.     Imaging:  CLINICAL DATA:  82 year old female with history of jaundice. Status post laparoscopic cholecystectomy 10/12/2022.   EXAM: MRI ABDOMEN WITHOUT AND WITH CONTRAST (INCLUDING MRCP)   TECHNIQUE: Multiplanar multisequence MR imaging of the abdomen was performed both before and after the administration of intravenous contrast. Heavily T2-weighted images of the biliary and pancreatic ducts were obtained, and three-dimensional MRCP images were rendered by post processing.   CONTRAST:  6mL GADAVIST GADOBUTROL 1 MMOL/ML IV SOLN   COMPARISON:  Abdominal MRI 08/30/2022. CT of the abdomen and pelvis 04/04/2023.   FINDINGS: Lower chest: Susceptibility artifact in the lower sternum from indwelling median sternotomy wires.   Hepatobiliary: Patient is status post cholecystectomy. When compared to the prior examination, there is increasingly prominent mass-like architectural distortion in the liver adjacent to the gallbladder fossa centered predominantly in segment 5. This areas heterogeneous in signal intensity on T1 and T2 weighted images, but generally centrally T1 hypointense with  heterogeneous T2 hyperintensity (including central areas that appear cystic). There is extensive hyperenhancement during arterial phase imaging in the surrounding hepatic parenchyma, but the central nidus of the lesion is estimated to measure approximately 3.5 x 2.5 x 2.0 cm on axial image 78 of series 1001 and coronal image 83 of series 12, where the lesion appears centrally hypovascular with a peripheral rim of  enhancement. The peripheral hypervascularity appears to resolve rapidly on more delayed post gadolinium imaging (although subtraction imaging demonstrates persistent peripheral enhancement), and the lesion appears hypovascular centrally on delayed imaging. Diffusion imaging demonstrates heterogeneous areas of diffusion restriction associated with this lesion. MRCP images demonstrates some mild intrahepatic biliary ductal dilatation. There is signal void in the region of the common hepatic duct (coronal MRCP image 68 of series 6), which could be related to artifact from cholecystectomy clips. Immediately distal to this the common hepatic duct measures 5 mm. Cystic duct remnant is not dilated. Common bile duct is normal in caliber measuring only 4 mm in the porta hepatis. No filling defects are noted in the common bile duct to suggest choledocholithiasis.   Pancreas: No pancreatic mass. No pancreatic ductal dilatation. No pancreatic or peripancreatic fluid collections or inflammatory changes.   Spleen:  Unremarkable.   Adrenals/Urinary Tract: Subcentimeter T1 hypointense, T2 hyperintense, nonenhancing lesion in the lower pole of the left kidney, compatible with a tiny simple cyst (Bosniak class 1, no imaging follow-up recommended). Right kidney and bilateral adrenal glands are normal in appearance. No hydroureteronephrosis in the visualized portions of the abdomen.   Stomach/Bowel: Visualized portions are unremarkable.   Vascular/Lymphatic: No aneurysm identified in the visualized abdominal vasculature. No lymphadenopathy noted in the abdomen.   Other: No significant volume of ascites noted in the visualized portions of the peritoneal cavity.   Musculoskeletal: No aggressive appearing osseous lesions are noted in the visualized portions of the skeleton.   IMPRESSION: 1. Unusual but increasingly aggressive and mass-like appearing lesion in segment 5 of the liver adjacent to the  gallbladder fossa, concerning for potential neoplasm. Tissue sampling should be considered to establish a tissue diagnosis. Further evaluation with PET-CT should also be considered. 2. Mild intrahepatic biliary ductal dilatation, new compared to the preoperative study before laparoscopic cholecystectomy. Today's study demonstrates signal void in the region of the common hepatic duct. This could be artifactual and related to cholecystectomy clips, however, the possibility of postoperative stricture is not excluded. Correlation with liver function tests is recommended. Lack of common bile duct dilatation is unusual in this post cholecystectomy patient, and could suggest narrowing of the common hepatic duct. ERCP could be considered for further evaluation if clinically appropriate. 3. Additional incidental findings, as above.   These results will be called to the ordering clinician or representative by the Radiologist Assistant, and communication documented in the PACS or [employer].     Electronically Signed   By: Trudie Reed M.D.   On: 04/05/2023 10:20 Labs:  CBC: Recent Labs    10/08/22 1327 10/13/22 0825 11/09/22 0608 11/16/22 0830 04/04/23 2138 04/05/23 0205  WBC 12.7* 18.0*  --   --  11.0* 9.9  HGB 12.0 10.8* 11.6* 11.9* 11.3* 9.6*  HCT 38.4 33.6* 34.0* 35.0* 34.9* 28.7*  PLT 336 267  --   --  210 171    COAGS: Recent Labs    04/05/23 0205  INR 1.7*    BMP: Recent Labs    10/13/22 0825 11/09/22 4540 03/11/23 0823 04/04/23 2138 04/05/23 0205 04/05/23 1430  NA 136   < > 140 137 138 141  K 4.7   < > 4.6 3.2* 2.9* 4.6  CL 103   < > 104 104 110 114*  CO2 25   < > 27 18* 17* 19*  GLUCOSE 223*   < > 152* 159* 60* 86  BUN 27*   < > 25* 27* 25* 20  CALCIUM 9.1   < > 9.8 9.1 8.4* 9.2  CREATININE 1.33*   < > 1.21* 1.87* 1.75* 1.62*  GFRNONAA 40*  --   --  27* 29* 32*   < > = values in this interval not displayed.    LIVER FUNCTION TESTS: Recent  Labs    06/28/22 1705 10/13/22 0825 04/04/23 2138 04/05/23 0205  BILITOT 0.7 0.6 13.1* 11.1*  AST 125* 50* 185* 163*  ALT 54* 47* 137* 115*  ALKPHOS 98 58 646* 550*  PROT 7.4 6.1* 7.1 5.7*  ALBUMIN 4.1 3.3* 3.0* 2.5*    TUMOR MARKERS: No results for input(s): "AFPTM", "CEA", "CA199", "CHROMGRNA" in the last 8760 hours.  Assessment and Plan:  Will plan for biopsy of liver lesion on Monday.  Risks and benefits of liver lesion biopsy was discussed with the patient and/or patient's family including, but not limited to bleeding, infection, damage to adjacent structures or low yield requiring additional tests.  All of the questions were answered and there is agreement to proceed.  Consent signed and in chart.  INR 1.7 on admission. Will repeat INR on Monday am.  Xarelto held in [facility] on Sunday and Monday.  Thank you for allowing our service to participate in [name] 's care.  Electronically Signed: Gwynneth Macleod, PA-C   04/05/2023, 3:49 PM      I spent a total of 40 Minutes    in face to face in clinical consultation, greater than 50% of which was counseling/coordinating care for liver lesion biopsy

## 2023-04-05 NOTE — Hospital Course (Addendum)
82 y.o.f w/ hypertension,IDDM,CAD,PAD,and cholecystectomy in January of this year presented to the ED with jaundice. Patient reports approximately 1 week of fatigue, loss of appetite, and postprandial bloating AND noticed to have jaundice so came to the ED.She reports history of chronic diarrhea which is unchanged. In WG:[id number] and saturating well on room air with normal heart rate and elevated blood pressure. Labs are most notable for creatinine 1.87, alkaline phosphatase 646, AST 185, ALT 137, and total bilirubin 13.1. CT  abd > demonstrates ill-defined hypodensity in the region of the gallbladder fossa which appears similar to prior study. Patient was given 1 L of normal saline in the ED and admitted for further management. 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 Freida Busman 10/12/2022. MRCP 7/12>>"1. Unusual but increasingly aggressive and mass-like appearing lesion in segment 5 of the liver adjacent to the gallbladder fossa, concerning for potential neoplasm" advised to see sampling/PET/CT, also there is possibility of postoperative stricture, ERCP could be considered. GI and general surgery was consulted> with plan for ERCP 7/14 and liver biopsy 7/15

## 2023-04-05 NOTE — Care Management Obs Status (Cosign Needed)
MEDICARE OBSERVATION STATUS NOTIFICATION   Patient Details  Name: [name] MRN: [id number] Date of Birth: Oct 12, 1940   Medicare Observation Status Notification Given:  Yes    Janae Bridgeman, RN 04/05/2023, 4:02 PM

## 2023-04-05 NOTE — Consult Note (Signed)
Reason for Consult: Jaundice Referring Physician: Hospital team  [name] is an 82 y.o. female.  HPI: Patient seen and examined in her hospital computer chart and her office computer chart was reviewed and her case discussed with her daughter as well and other than her jaundice she has had minimal GI symptoms and specifically no fever or weight loss or significant abdominal pain although she may have noticed a little early satiety and bloating this week she only noticed dark urine and her yellow jaundice this week and her liver tests were normal in May in her primary team's office and since her surgery she has had looser stools no more than 3 to 4-day not waking her up and her family history is pertinent for her dad with colon cancer but no liver problems in the family and her daughter had her gallbladder out after childbirth and no other significant complaints and she is on Xarelto for peripheral vascular disease and we answered all of their questions and other than starting vitamin C a bit ago denies any other new medicines recent antibiotic use etc. 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 There is a small calcified mass in the L:A near the LA appendage. No valvular masses seen with associated mitral annular calcification. LA Volume/ BSA27.4 ml/m2 No AS. Right ventricular systolic pressure is elevated at .   Myocardial  infarction [facility])    June 2021   Neuromuscular disorder (HCC)    neuropathy in bilateral feet   Peripheral vascular disease (HCC)    Pneumonia    not hosp.    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  PTCAs with drug coated balloon Lt ant tibial artery and Lt popliteal artery  11/26/2019    Past Surgical History:  Procedure Laterality Date   ABDOMINAL AORTAGRAM  11/25/2019   ABDOMINAL AORTOGRAM W/LOWER EXTREMITY    ABDOMINAL AORTOGRAM N/A 06/15/2019   Procedure: ABDOMINAL AORTOGRAM;  Surgeon: Runell Gess, MD;  [facility];  Service: Cardiovascular;  Laterality: N/A;   ABDOMINAL AORTOGRAM W/LOWER EXTREMITY N/A 11/25/2019   Procedure: ABDOMINAL AORTOGRAM W/LOWER EXTREMITY;  Surgeon: Iran Ouch, MD;  [facility];  Service: Cardiovascular;  Laterality: N/A;  Lt leg   ABDOMINAL AORTOGRAM W/LOWER EXTREMITY N/A 08/01/2020   Procedure: ABDOMINAL AORTOGRAM W/LOWER EXTREMITY;  Surgeon: Runell Gess, MD;  [facility];  Service: Cardiovascular;  Laterality: N/A;   ABDOMINAL AORTOGRAM W/LOWER EXTREMITY N/A 04/28/2021   Procedure: ABDOMINAL AORTOGRAM W/LOWER EXTREMITY;  Surgeon: Sherren Kerns, MD;  [facility];  Service: Cardiovascular;  Laterality: N/A;   ABDOMINAL AORTOGRAM W/LOWER EXTREMITY N/A 10/13/2021   Procedure: ABDOMINAL AORTOGRAM W/LOWER EXTREMITY;  Surgeon: Leonie Douglas, MD;  [facility];  Service: Cardiovascular;  Laterality: N/A;   ABDOMINAL AORTOGRAM W/LOWER EXTREMITY N/A 11/09/2022   Procedure: ABDOMINAL AORTOGRAM W/LOWER EXTREMITY;  Surgeon: Leonie Douglas, MD;  [facility];  Service: Cardiovascular;  Laterality: N/A;   ABDOMINAL AORTOGRAM W/LOWER EXTREMITY N/A 11/16/2022   Procedure: ABDOMINAL AORTOGRAM W/LOWER EXTREMITY;  Surgeon: Leonie Douglas, MD;  [facility];  Service: Cardiovascular;  Laterality: N/A;   AMPUTATION TOE Left  05/03/2021   Procedure: AMPUTATION OF THIRD LEFT  TOE;  Surgeon: Leonie Douglas, MD;  Location: [facility] OR;  Service: Vascular;  Laterality: Left;   APPENDECTOMY     APPLICATION OF WOUND VAC Right 07/21/2019   Procedure: Application Of Prevena Wound Vac Right Groin;  Surgeon: Nada Libman, MD;  [facility];  Service: Vascular;  Laterality: Right;   CARPAL TUNNEL RELEASE Left    CARPAL TUNNEL RELEASE Right    CESAREAN SECTION     x 2   CHOLECYSTECTOMY N/A 10/12/2022   Procedure: LAPAROSCOPIC PARTIAL FENESTRATING CHOLECYSTECTOMY;  Surgeon: Fritzi Mandes, MD;  [facility];  Service: General;  Laterality: N/A;   COLONOSCOPY     CORONARY ARTERY BYPASS GRAFT N/A 03/24/2020   Procedure: CORONARY ARTERY BYPASS GRAFTING (CABG) using LIMA to LAD (m); RIMA to RAMUS; Endoscopic Right Greater Saphenous Vein: SVG to Diag1; SVG to PLB (right); and SVG to PL (left).;  Surgeon: Linden Dolin, MD;  [facility];  Service: Open Heart Surgery;  Laterality: N/A;  BILATERAL IMA   ENDARTERECTOMY FEMORAL Right 07/21/2019   Procedure: RIGHT ENDARTERECTOMY FEMORAL WITH PATCH ANGIOPLASTY;  Surgeon: Nada Libman, MD;  [facility];  Service: Vascular;  Laterality: Right;   ENDARTERECTOMY FEMORAL Right 08/16/2020   Procedure: RIGHT FEMORAL ENDARTERECTOMY;  Surgeon: Maeola Harman, MD;  Location: [facility] OR;  Service: Vascular;  Laterality: Right;   ENDOVEIN HARVEST OF GREATER SAPHENOUS VEIN Right 03/24/2020   Procedure: ENDOVEIN HARVEST OF GREATER SAPHENOUS VEIN;  Surgeon: Linden Dolin, MD;  [facility];  Service: Open Heart Surgery;  Laterality: Right;   EYE SURGERY     cataract removal bilaterally   FEMORAL-POPLITEAL BYPASS GRAFT Right 08/16/2020   Procedure: BYPASS GRAFT FEMORAL-POPLITEAL ARTERY;  Surgeon: Maeola Harman, MD;  Location[facility] OR;  Service: Vascular;  Laterality: Right;   FEMORAL-POPLITEAL BYPASS GRAFT Left 05/03/2021   Procedure: LEFT FEMORAL TO BELOW  KNEE POPLITEAL ARTERY BYPASS GRAFTING WITH 6MMX80 PTFE GRAFT;  Surgeon: Leonie Douglas, MD;  [facility];  Service: Vascular;  Laterality: Left;   LEFT HEART CATH AND CORONARY ANGIOGRAPHY N/A 03/22/2020   Procedure: LEFT HEART CATH AND CORONARY ANGIOGRAPHY;  Surgeon: Swaziland, Peter M, MD;  Location: [facility] INVAS[facility] CV LAB;  Service: Cardiovascular;  Laterality: N/A;   LOWER EXTREMITY ANGIOGRAPHY Bilateral 06/15/2019   Procedure: Lower Extremity Angiography;  Surgeon: Runell Gess, MD;  Location: [facility] INVASIVE CV LAB;  Service: Cardiovascular;  Laterality: Bilateral;   LOWER EXTREMITY ANGIOGRAPHY Right 08/03/2019   Procedure: LOWER EXTREMITY ANGIOGRAPHY;  Surgeon: Runell Gess, MD;  [facility];  Service: Cardiovascular;  Laterality: Right;   LOWER EXTREMITY ANGIOGRAPHY N/A 09/07/2021   Procedure: LOWER EXTREMITY ANGIOGRAPHY;  Surgeon: Cephus Shelling, MD;  [facility];  Service: Cardiovascular;  Laterality: N/A;   PATCH ANGIOPLASTY Right 07/21/2019   Procedure: Patch Angioplasty Right Femoral Artery;  Surgeon: Nada Libman, MD;  Location: [facility] OR;  Service: Vascular;  Laterality: Right;   PERIPHERAL INTRAVASCULAR LITHOTRIPSY  11/25/2019   Procedure: INTRAVASCULAR LITHOTRIPSY;  Surgeon: Iran Ouch, MD;  [facility];  Service: Cardiovascular;;  LT. SFA   PERIPHERAL VASCULAR ATHERECTOMY  08/03/2019   Procedure: PERIPHERAL VASCULAR ATHERECTOMY;  Surgeon: Runell Gess, MD;  Location: [facility] INVASIVE CV LAB;  Service: Cardiovascular;;  right SFA, right TP trunk   PERIPHERAL VASCULAR ATHERECTOMY  11/25/2019   Procedure: PERIPHERAL VASCULAR ATHERECTOMY;  Surgeon: Iran Ouch, MD;  [facility];  Service: Cardiovascular;;  Lt.  POPLITEAL and AT   PERIPHERAL VASCULAR BALLOON ANGIOPLASTY Left 06/15/2019   Procedure: PERIPHERAL VASCULAR BALLOON ANGIOPLASTY;  Surgeon: Runell Gess, MD;  [facility];  Service: Cardiovascular;  Laterality: Left;  SFA UNSUCCESSFUL UNABLE TO CROSS LESION   PERIPHERAL VASCULAR BALLOON ANGIOPLASTY  08/03/2019   Procedure: PERIPHERAL VASCULAR BALLOON ANGIOPLASTY;  Surgeon: Runell Gess, MD;  [facility];  Service: Cardiovascular;;  right SFA, Right TP trunk   PERIPHERAL VASCULAR BALLOON ANGIOPLASTY  09/07/2021   Procedure: PERIPHERAL VASCULAR BALLOON ANGIOPLASTY;  Surgeon: Cephus Shelling, MD;  [facility];  Service: Cardiovascular;;   PERIPHERAL VASCULAR BALLOON ANGIOPLASTY Right 10/13/2021   Procedure: PERIPHERAL VASCULAR BALLOON ANGIOPLASTY;  Surgeon: Leonie Douglas, MD;  [facility];  Service: Cardiovascular;  Laterality: Right;   PERIPHERAL VASCULAR BALLOON ANGIOPLASTY  11/09/2022   Procedure: PERIPHERAL VASCULAR BALLOON ANGIOPLASTY;  Surgeon: Leonie Douglas, MD;  [facility];  Service: Cardiovascular;;  fem-pop bypass and AT   TEE WITHOUT CARDIOVERSION N/A 03/24/2020   Procedure: TRANSESOPHAGEAL ECHOCARDIOGRAM (TEE);  Surgeon: Linden Dolin, MD;  Location: [facility] OR;  Service: Open Heart Surgery;  Laterality: N/A;   TONSILLECTOMY     and adenoidectomy   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/2019   Procedure: TRANSMETATARSAL AMPUTATION;  Surgeon: Vivi Barrack, DPM;  [facility];  Service: Podiatry;  Laterality: Right;   TRANSMETATARSAL AMPUTATION Left 05/11/2021   Procedure: LEFT TRANSMETATARSAL AMPUTATION;  Surgeon: Maeola Harman, MD;  Location: [facility] OR;  Service: Vascular;  Laterality: Left;   TUBAL LIGATION      Family History  Problem Relation Age of Onset   Cancer - Prostate Father    Cancer - Colon Father    Stroke Mother    Hypertension Mother    Hyperlipidemia Mother    Melanoma Brother     Social History:  reports that she quit smoking about 51 years ago. Her smoking use included cigarettes. She has never used smokeless tobacco. She  reports that she does not drink alcohol and does not use drugs.  Allergies:  Allergies  Allergen Reactions   Versed [Midazolam] Anxiety    Frantic, out of my mind, agitated    Augmentin [Amoxicillin-Pot Clavulanate] Other (See Comments)    Elevates potassium level   Bactrim [Sulfamethoxazole-Trimethoprim] Other (See Comments)    ^ K+( elevated)    Demerol [Meperidine]     Delusional    Meperidine Hcl     Other reaction(s): delusional   Scopolamine     Delusional    Tramadol Other (See Comments)    Keeps awake    Medications: I have reviewed the patient's current medications.  Results for orders placed or performed during the hospital encounter of 04/04/23 (from the past 48 hour(s))  CBC with Differential     Status: Abnormal   Collection Time: 04/04/23  9:38 PM  Result Value Ref Range   WBC 11.0 (  H) 4.0 - 10.5 K/uL   RBC 4.03 3.87 - 5.11 MIL/uL   Hemoglobin 11.3 (L) 12.0 - 15.0 g/dL   HCT 16.1 (L) 09.6 - 04.5 %   MCV 86.6 80.0 - 100.0 fL   MCH 28.0 26.0 - 34.0 pg   MCHC 32.4 30.0 - 36.0 g/dL   RDW 40.9 (H) 81.1 - 91.4 %   Platelets 210 150 - 400 K/uL   nRBC 0.0 0.0 - 0.2 %   Neutrophils Relative % 71 %   Neutro Abs 7.8 (H) 1.7 - 7.7 K/uL   Lymphocytes Relative 15 %   Lymphs Abs 1.6 0.7 - 4.0 K/uL   Monocytes Relative 12 %   Monocytes Absolute 1.3 (H) 0.1 - 1.0 K/uL   Eosinophils Relative 1 %   Eosinophils Absolute 0.1 0.0 - 0.5 K/uL   Basophils Relative 1 %   Basophils Absolute 0.1 0.0 - 0.1 K/uL   Immature Granulocytes 0 %   Abs Immature Granulocytes 0.04 0.00 - 0.07 K/uL    Comment: Performed at [facility], [address] [place], [place] [address]  Comprehensive metabolic panel     Status: Abnormal   Collection Time: 04/04/23  9:38 PM  Result Value Ref Range   Sodium 137 135 - 145 mmol/L   Potassium 3.2 (L) 3.5 - 5.1 mmol/L   Chloride 104 98 - 111 mmol/L   CO2 18 (L) 22 - 32 mmol/L   Glucose, Bld 159 (H) 70 - 99 mg/dL    Comment: Glucose reference  range applies only to samples taken after fasting for at least 8 hours.   BUN 27 (H) 8 - 23 mg/dL   Creatinine, Ser 6.21 (H) 0.44 - 1.00 mg/dL   Calcium 9.1 8.9 - 30.8 mg/dL   Total Protein 7.1 6.5 - 8.1 g/dL   Albumin 3.0 (L) 3.5 - 5.0 g/dL   AST 657 (H) 15 - 41 U/L   ALT 137 (H) 0 - 44 U/L   Alkaline Phosphatase 646 (H) 38 - 126 U/L   Total Bilirubin 13.1 (H) 0.3 - 1.2 mg/dL   GFR, Estimated 27 (L) >60 mL/min    Comment: (NOTE) Calculated using the CKD-EPI Creatinine Equation (2021)    Anion gap 15 5 - 15    Comment: Performed at [facility], [address], [place], [place] [address]  Urinalysis, Routine w reflex microscopic -Urine, Clean Catch     Status: Abnormal   Collection Time: 04/04/23  9:40 PM  Result Value Ref Range   Color, Urine AMBER (A) YELLOW    Comment: BIOCHEMICALS MAY BE AFFECTED BY COLOR   APPearance HAZY (A) CLEAR   Specific Gravity, Urine 1.007 1.005 - 1.030   pH 5.0 5.0 - 8.0   Glucose, UA >=500 (A) NEGATIVE mg/dL   Hgb urine dipstick MODERATE (A) NEGATIVE   Bilirubin Urine NEGATIVE NEGATIVE   Ketones, ur NEGATIVE NEGATIVE mg/dL   Protein, ur 30 (A) NEGATIVE mg/dL   Nitrite NEGATIVE NEGATIVE   Leukocytes,Ua NEGATIVE NEGATIVE   RBC / HPF 0-5 0 - 5 RBC/hpf   WBC, UA 0-5 0 - 5 WBC/hpf   Bacteria, UA RARE (A) NONE SEEN   Squamous Epithelial / HPF 0-5 0 - 5 /HPF   Mucus PRESENT     Comment: Performed at [facility], [address] [place], [place] [address]  Hepatitis panel, acute     Status: None   Collection Time: 04/04/23 10:45 PM  Result  Value Ref Range   Hepatitis B Surface Ag NON REACTIVE NON REACTIVE   HCV Ab NON REACTIVE NON REACTIVE    Comment: (NOTE) Nonreactive HCV antibody screen is consistent with no HCV infections,  unless recent infection is suspected or other evidence exists to indicate HCV infection.     Hep A IgM NON REACTIVE NON REACTIVE   Hep B C IgM NON REACTIVE NON REACTIVE    Comment: Performed at [facility], [address] [place], [place] [address]  Comprehensive metabolic panel     Status: Abnormal   Collection Time: 04/05/23  2:05 AM  Result Value Ref Range   Sodium 138 135 - 145 mmol/L   Potassium 2.9 (L) 3.5 - 5.1 mmol/L   Chloride 110 98 - 111 mmol/L   CO2 17 (L) 22 - 32 mmol/L   Glucose, Bld 60 (L) 70 - 99 mg/dL    Comment: Glucose reference range applies only to samples taken after fasting for at least 8 hours.   BUN 25 (H) 8 - 23 mg/dL   Creatinine, Ser 2.95 (H) 0.44 - 1.00 mg/dL   Calcium 8.4 (L) 8.9 - 10.3 mg/dL   Total Protein 5.7 (L) 6.5 - 8.1 g/dL   Albumin 2.5 (L) 3.5 - 5.0 g/dL   AST 621 (H) 15 - 41 U/L   ALT 115 (H) 0 - 44 U/L   Alkaline Phosphatase 550 (H) 38 - 126 U/L   Total Bilirubin 11.1 (H) 0.3 - 1.2 mg/dL   GFR, Estimated 29 (L) >60 mL/min    Comment: (NOTE) Calculated using the CKD-EPI Creatinine Equation (2021)    Anion gap 11 5 - 15    Comment: Performed at [facility], [address], [place], [place] [address]  Magnesium     Status: Abnormal   Collection Time: 04/05/23  2:05 AM  Result Value Ref Range   Magnesium 1.4 (L) 1.7 - 2.4 mg/dL    Comment: Performed at [facility], [address] [place], [place] [address]  CBC     Status: Abnormal   Collection Time: 04/05/23  2:05 AM  Result Value Ref Range   WBC 9.9 4.0 - 10.5 K/uL   RBC 3.36 (L) 3.87 - 5.11 MIL/uL   Hemoglobin 9.6 (L) 12.0 - 15.0 g/dL   HCT 62.9 (L) 52.8 - 41.3 %   MCV 85.4 80.0 - 100.0 fL   MCH 28.6 26.0 - 34.0 pg   MCHC 33.4 30.0 - 36.0 g/dL   RDW 24.4 (H) 01.0 - 27.2 %   Platelets 171 150 - 400 K/uL    Comment: REPEATED TO VERIFY   nRBC 0.0 0.0 - 0.2 %    Comment: Performed at [facility], [address], [place], [place] [address]  Lipase, blood     Status: None   Collection Time: 04/05/23  2:05 AM  Result Value Ref Range   Lipase 43 11 - 51 U/L    Comment: Performed at [facility], [address] [place], [place] [address]  Protime-INR      Status: Abnormal   Collection Time: 04/05/23  2:05 AM  Result Value Ref Range   Prothrombin Time 20.6 (H) 11.4 - 15.2 seconds   INR 1.7 (H) 0.8 - 1.2    Comment: (NOTE) INR goal varies based on device and disease states. Performed at [facility], [address] [place], [place] [address]   CBG monitoring,  ED     Status: Abnormal   Collection Time: 04/05/23  2:15 AM  Result Value Ref Range   Glucose-Capillary 53 (L) 70 - 99 mg/dL    Comment: Glucose reference range applies only to samples taken after fasting for at least 8 hours.  Glucose, capillary     Status: None   Collection Time: 04/05/23  2:58 AM  Result Value Ref Range   Glucose-Capillary 87 70 - 99 mg/dL    Comment: Glucose reference range applies only to samples taken after fasting for at least 8 hours.  Glucose, capillary     Status: Abnormal   Collection Time: 04/05/23  7:26 AM  Result Value Ref Range   Glucose-Capillary 136 (H) 70 - 99 mg/dL    Comment: Glucose reference range applies only to samples taken after fasting for at least 8 hours.  Glucose, capillary     Status: None   Collection Time: 04/05/23 12:02 PM  Result Value Ref Range   Glucose-Capillary 94 70 - 99 mg/dL    Comment: Glucose reference range applies only to samples taken after fasting for at least 8 hours.  Basic metabolic panel     Status: Abnormal   Collection Time: 04/05/23  2:30 PM  Result Value Ref Range   Sodium 141 135 - 145 mmol/L   Potassium 4.6 3.5 - 5.1 mmol/L   Chloride 114 (H) 98 - 111 mmol/L   CO2 19 (L) 22 - 32 mmol/L   Glucose, Bld 86 70 - 99 mg/dL    Comment: Glucose reference range applies only to samples taken after fasting for at least 8 hours.   BUN 20 8 - 23 mg/dL   Creatinine, Ser 5.95 (H) 0.44 - 1.00 mg/dL   Calcium 9.2 8.9 - 63.8 mg/dL   GFR, Estimated 32 (L) >60 mL/min    Comment: (NOTE) Calculated using the CKD-EPI Creatinine Equation (2021)    Anion gap 8 5 - 15    Comment: Performed at [facility], [address], [place], [place] [address]    MR ABDOMEN MRCP W WO CONTAST  Result Date: 04/05/2023 CLINICAL DATA:  82 year old female with history of jaundice. Status post laparoscopic cholecystectomy 10/12/2022. EXAM: MRI ABDOMEN WITHOUT AND WITH CONTRAST (INCLUDING MRCP) TECHNIQUE: Multiplanar multisequence MR imaging of the abdomen was performed both before and after the administration of intravenous contrast. Heavily T2-weighted images of the biliary and pancreatic ducts were obtained, and three-dimensional MRCP images were rendered by post processing. CONTRAST:  6mL GADAVIST GADOBUTROL 1 MMOL/ML IV SOLN COMPARISON:  Abdominal MRI 08/30/2022. CT of the abdomen and pelvis 04/04/2023. FINDINGS: Lower chest: Susceptibility artifact in the lower sternum from indwelling median sternotomy wires. Hepatobiliary: Patient is status post cholecystectomy. When compared to the prior examination, there is increasingly prominent mass-like architectural distortion in the liver adjacent to the gallbladder fossa centered predominantly in segment 5. This areas heterogeneous in signal intensity on T1 and T2 weighted images, but generally centrally T1 hypointense with heterogeneous T2 hyperintensity (including central areas that appear cystic). There is extensive hyperenhancement during arterial phase imaging in the surrounding hepatic parenchyma, but the central nidus of the lesion is estimated to measure approximately 3.5 x 2.5 x 2.0 cm on axial image 78 of series 1001 and coronal image 83 of series 12, where the lesion appears centrally hypovascular with a peripheral rim of enhancement. The peripheral hypervascularity appears to resolve rapidly on more delayed post gadolinium imaging (although subtraction imaging demonstrates persistent peripheral enhancement), and  the lesion appears hypovascular centrally on delayed imaging. Diffusion imaging demonstrates heterogeneous areas of diffusion restriction associated  with this lesion. MRCP images demonstrates some mild intrahepatic biliary ductal dilatation. There is signal void in the region of the common hepatic duct (coronal MRCP image 68 of series 6), which could be related to artifact from cholecystectomy clips. Immediately distal to this the common hepatic duct measures 5 mm. Cystic duct remnant is not dilated. Common bile duct is normal in caliber measuring only 4 mm in the porta hepatis. No filling defects are noted in the common bile duct to suggest choledocholithiasis. Pancreas: No pancreatic mass. No pancreatic ductal dilatation. No pancreatic or peripancreatic fluid collections or inflammatory changes. Spleen:  Unremarkable. Adrenals/Urinary Tract: Subcentimeter T1 hypointense, T2 hyperintense, nonenhancing lesion in the lower pole of the left kidney, compatible with a tiny simple cyst (Bosniak class 1, no imaging follow-up recommended). Right kidney and bilateral adrenal glands are normal in appearance. No hydroureteronephrosis in the visualized portions of the abdomen. Stomach/Bowel: Visualized portions are unremarkable. Vascular/Lymphatic: No aneurysm identified in the visualized abdominal vasculature. No lymphadenopathy noted in the abdomen. Other: No significant volume of ascites noted in the visualized portions of the peritoneal cavity. Musculoskeletal: No aggressive appearing osseous lesions are noted in the visualized portions of the skeleton. IMPRESSION: 1. Unusual but increasingly aggressive and mass-like appearing lesion in segment 5 of the liver adjacent to the gallbladder fossa, concerning for potential neoplasm. Tissue sampling should be considered to establish a tissue diagnosis. Further evaluation with PET-CT should also be considered. 2. Mild intrahepatic biliary ductal dilatation, new compared to the preoperative study before laparoscopic cholecystectomy. Today's study demonstrates signal void in the region of the common hepatic duct. This could be  artifactual and related to cholecystectomy clips, however, the possibility of postoperative stricture is not excluded. Correlation with liver function tests is recommended. Lack of common bile duct dilatation is unusual in this post cholecystectomy patient, and could suggest narrowing of the common hepatic duct. ERCP could be considered for further evaluation if clinically appropriate. 3. Additional incidental findings, as above. These results will be called to the ordering clinician or representative by the Radiologist Assistant, and communication documented in the PACS or [employer]. Electronically Signed   By: Trudie Reed M.D.   On: 04/05/2023 10:20   MR 3D Recon At Scanner  Result Date: 04/05/2023 CLINICAL DATA:  82 year old female with history of jaundice. Status post laparoscopic cholecystectomy 10/12/2022. EXAM: MRI ABDOMEN WITHOUT AND WITH CONTRAST (INCLUDING MRCP) TECHNIQUE: Multiplanar multisequence MR imaging of the abdomen was performed both before and after the administration of intravenous contrast. Heavily T2-weighted images of the biliary and pancreatic ducts were obtained, and three-dimensional MRCP images were rendered by post processing. CONTRAST:  6mL GADAVIST GADOBUTROL 1 MMOL/ML IV SOLN COMPARISON:  Abdominal MRI 08/30/2022. CT of the abdomen and pelvis 04/04/2023. FINDINGS: Lower chest: Susceptibility artifact in the lower sternum from indwelling median sternotomy wires. Hepatobiliary: Patient is status post cholecystectomy. When compared to the prior examination, there is increasingly prominent mass-like architectural distortion in the liver adjacent to the gallbladder fossa centered predominantly in segment 5. This areas heterogeneous in signal intensity on T1 and T2 weighted images, but generally centrally T1 hypointense with heterogeneous T2 hyperintensity (including central areas that appear cystic). There is extensive hyperenhancement during arterial phase imaging in the  surrounding hepatic parenchyma, but the central nidus of the lesion is estimated to measure approximately 3.5 x 2.5 x 2.0 cm on axial image 78 of series  1001 and coronal image 83 of series 12, where the lesion appears centrally hypovascular with a peripheral rim of enhancement. The peripheral hypervascularity appears to resolve rapidly on more delayed post gadolinium imaging (although subtraction imaging demonstrates persistent peripheral enhancement), and the lesion appears hypovascular centrally on delayed imaging. Diffusion imaging demonstrates heterogeneous areas of diffusion restriction associated with this lesion. MRCP images demonstrates some mild intrahepatic biliary ductal dilatation. There is signal void in the region of the common hepatic duct (coronal MRCP image 68 of series 6), which could be related to artifact from cholecystectomy clips. Immediately distal to this the common hepatic duct measures 5 mm. Cystic duct remnant is not dilated. Common bile duct is normal in caliber measuring only 4 mm in the porta hepatis. No filling defects are noted in the common bile duct to suggest choledocholithiasis. Pancreas: No pancreatic mass. No pancreatic ductal dilatation. No pancreatic or peripancreatic fluid collections or inflammatory changes. Spleen:  Unremarkable. Adrenals/Urinary Tract: Subcentimeter T1 hypointense, T2 hyperintense, nonenhancing lesion in the lower pole of the left kidney, compatible with a tiny simple cyst (Bosniak class 1, no imaging follow-up recommended). Right kidney and bilateral adrenal glands are normal in appearance. No hydroureteronephrosis in the visualized portions of the abdomen. Stomach/Bowel: Visualized portions are unremarkable. Vascular/Lymphatic: No aneurysm identified in the visualized abdominal vasculature. No lymphadenopathy noted in the abdomen. Other: No significant volume of ascites noted in the visualized portions of the peritoneal cavity. Musculoskeletal: No  aggressive appearing osseous lesions are noted in the visualized portions of the skeleton. IMPRESSION: 1. Unusual but increasingly aggressive and mass-like appearing lesion in segment 5 of the liver adjacent to the gallbladder fossa, concerning for potential neoplasm. Tissue sampling should be considered to establish a tissue diagnosis. Further evaluation with PET-CT should also be considered. 2. Mild intrahepatic biliary ductal dilatation, new compared to the preoperative study before laparoscopic cholecystectomy. Today's study demonstrates signal void in the region of the common hepatic duct. This could be artifactual and related to cholecystectomy clips, however, the possibility of postoperative stricture is not excluded. Correlation with liver function tests is recommended. Lack of common bile duct dilatation is unusual in this post cholecystectomy patient, and could suggest narrowing of the common hepatic duct. ERCP could be considered for further evaluation if clinically appropriate. 3. Additional incidental findings, as above. These results will be called to the ordering clinician or representative by the Radiologist Assistant, and communication documented in the PACS or [employer]. Electronically Signed   By: Trudie Reed M.D.   On: 04/05/2023 10:20   CT ABDOMEN PELVIS WO CONTRAST  Result Date: 04/04/2023 CLINICAL DATA:  Jaundice, concern for malignancy. EXAM: CT ABDOMEN AND PELVIS WITHOUT CONTRAST TECHNIQUE: Multidetector CT imaging of the abdomen and pelvis was performed following the standard protocol without IV contrast. RADIATION DOSE REDUCTION: This exam was performed according to the departmental dose-optimization program which includes automated exposure control, adjustment of the mA and/or kV according to patient size and/or use of iterative reconstruction technique. COMPARISON:  MRI abdomen 08/30/2022.  CT abdomen 08/14/2022. FINDINGS: Lower chest: No acute abnormality. Hepatobiliary:  There some ill-defined heterogeneous hypodensity in the region of the gallbladder fossa measuring 2.4 x 3.0 cm this appears similar to the prior study given lack of contrast. Gallbladder is surgically absent. There is no biliary ductal dilatation. Pancreas: Unremarkable. No pancreatic ductal dilatation or surrounding inflammatory changes. Spleen: Normal in size without focal abnormality. Adrenals/Urinary Tract: There is a 3 mm calculus in the right kidney. There is no hydronephrosis  or perinephric fat stranding. Left kidney, ureters and bladder are normal. Stomach/Bowel: Stomach is within normal limits. No evidence of bowel wall thickening, distention, or inflammatory changes. The appendix is not seen. There is a large amount of stool throughout colon. Vascular/Lymphatic: There severe atherosclerotic calcifications of the aorta, iliac arteries and branch vessels. There are no enlarged lymph nodes identified. Vascular stents are seen in the femoral regions along with postsurgical change and scarring bilaterally. Reproductive: Uterus and bilateral adnexa are unremarkable. Other: No abdominal wall hernia or abnormality. No abdominopelvic ascites. Musculoskeletal: There are few scattered rounded lucent areas in the vertebral bodies particularly at L2. These are similar to prior. IMPRESSION: 1. Ill-defined hypodensity in the region of the gallbladder fossa appears similar to the prior study given lack of contrast. This would be better evaluated with dedicated MRI. 2. Nonobstructing right renal calculus. 3. Large amount of stool throughout the colon. 4. Stable indeterminate lucent areas in the vertebral bodies of the lumbar spine. 5. Severe atherosclerotic disease. Aortic Atherosclerosis (ICD10-I70.0). Electronically Signed   By: Darliss Cheney M.D.   On: 04/04/2023 23:19    ROS negative except above Blood pressure (!) 176/69, pulse 100, temperature 97.6 F (36.4 C), temperature source Oral, resp. rate 18, height 5\' 5"   (1.651 m), weight 65.7 kg, SpO2 99%. Physical Exam vital signs stable afebrile no acute distress obviously jaundice abdomen is soft nontender labs CT and MRI reviewed hepatitis studies negative  Assessment/Plan: Jaundice questionable etiology ducts not particularly dilated to imply an obstruction but I do not think the possible liver mass will explain this degree of jaundice and eventually will probably need an ERCP just to be sure Plan: I would consult IR for liver biopsy consideration first and the surgical team to get their opinion on her case and then when she is over for biopsy and if no other obvious causes come to [name] will probably need a ERCP and the risk benefits methods and success rate were discussed with the duodenum attached to the gallbladder there may be some difficulty in maintaining adequate position or finding the ampulla based on her surgical note and some of this could be done as an outpatient and since no plans for today okay for her to eat weight others' opinions as above and I will check on tomorrow  [facility] E 04/05/2023, 3:27 PM

## 2023-04-05 NOTE — Plan of Care (Signed)

## 2023-04-05 NOTE — Progress Notes (Signed)
PROGRESS NOTE [name]  ZOX:[id number] DOB: 12-May-1941 DOA: 04/04/2023 PCP: Shon Hale, MD  Brief Narrative/Hospital Course: 82 y.o.f w/ hypertension,IDDM,CAD,PAD,and cholecystectomy in January of this year presented to the ED with jaundice. Patient reports approximately 1 week of fatigue, loss of appetite, and postprandial bloating AND noticed to have jaundice so came to the ED. She reports history of chronic diarrhea which is unchanged. In WJ:XBJYNWGN and saturating well on room air with normal heart rate and elevated blood pressure. Labs are most notable for creatinine 1.87, alkaline phosphatase 646, AST 185, ALT 137, and total bilirubin 13.1. CT  abd > demonstrates ill-defined hypodensity in the region of the gallbladder fossa which appears similar to prior study. Patient was given 1 L of normal saline in the ED and admitted for further management.  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 Freida Busman 10/12/2022.    Subjective: Seen and examined Resting well, daughter at bedside No new complaints   Assessment and Plan: Principal Problem:   Jaundice Active Problems:   DM2 (diabetes mellitus, type 2) (HCC)   Carotid artery disease (HCC)   PAD (peripheral artery disease) (HCC)   Acute renal failure superimposed on stage 3b chronic kidney disease (HCC)   Coronary artery disease involving native heart without angina pectoris   Abnormal LFTs/transaminitis Previous partial fenestrating cholecystectomy by Dr. Freida Busman 10/12/2022: LFTs remains abnormal see trend below, TB 13>11, CT abdomen unchanged ill-defined hypodensity in the gallbladder fossa, MRCP done this  morning abnormal as below > await further GI evaluation and recommendation_>er discussion with Dr Ewing Schlein gettign IR consult for biosy and also consulted gen surgery. MRcp>>"1. Unusual but increasingly aggressive and mass-like appearing lesion in segment 5 of the liver adjacent to the gallbladder fossa, concerning for potential neoplasm. Tissue sampling should be considered to establish a tissue diagnosis. Further evaluation with PET-CT should also be considered. 2. Mild intrahepatic biliary ductal dilatation, new compared to the preoperative study before laparoscopic cholecystectomy. Today's study demonstrates signal void in the region of the common hepatic duct. This could be artifactual and related to cholecystectomy clips, however, the possibility of postoperative stricture is not excluded. Correlation with liver function tests is recommended. Lack of common bile duct dilatation is unusual in this post cholecystectomy patient, and could suggest narrowing of the common hepatic duct. ERCP could be considered for further evaluation if clinically appropriate". Recent Labs  Lab 04/04/23 2138 04/05/23 0205  AST 185* 163*  ALT 137* 115*  ALKPHOS 646* 550*  BILITOT 13.1* 11.1*  PROT 7.1 5.7*  ALBUMIN 3.0* 2.5*  INR  --  1.7*    AKI on CKD 3b Metabolic acidosis bicarb 18-17:  Baseline around 1.2 last month. creat 1.87> 1.7.No hydronephrosis on CT in ED. Cont to hold hctz, ARB, Jardiance, and metformin and cont ivf , avoid nephrotoxic medication and repeat labs.  Recent Labs    06/06/22 0811 06/28/22 1705 08/28/22 0912 10/08/22 1327 10/13/22 0825 11/09/22 0608 11/16/22 0830 12/07/22 0845 03/11/23 0823 04/04/23 2138 04/05/23 0205  BUN 20 24* 15 23 27* 30* 22 23 25* 27* 25*  CREATININE 0.92 1.06* 0.86 1.09* 1.33* 1.00 1.10* 1.03 1.21* 1.87* 1.75*  CO2 25 21* 28 24 25   --   --  29 27 18* 17*  Hypokalemia potassium further dropping, will replete aggressively and recheck later  todayHypomagnesemia replace IV  Recent Labs  Lab 04/04/23 2138 04/05/23 0205  K 3.2* 2.9*  CALCIUM 9.1 8.4*  MG  --  1.4*    CAD:  No chest pain, onset, holding Xarelto  PAD:  No acute ischemia, cont Xarelto, hold Lipitor for now   Hypertension:  Cont to hold hydrochlorothiazide and ARB as above, continue metoprolol  Type II DM with hyperglycemia:  hba1c was 7.9% in June 2024,c cont ssi Recent Labs  Lab 04/05/23 0215 04/05/23 0258 04/05/23 0726 04/05/23 1202  GLUCAP 53* 87 136* 94     Normocytic anemia: hemoglobin slightly downtrending, check anemia panel  DVT prophylaxis: SCDs Start: 04/05/23 0030 Code Status:   Code Status: Full Code Family Communication: plan of care discussed with patient/ at bedside. Patient status is: Inpatient because of abnormal LFTs Level of care: Med-Surg   Dispo: The patient is from: home            Anticipated disposition: TBD Objective: Vitals last 24 hrs: Vitals:   04/05/23 0207 04/05/23 0258 04/05/23 0423 04/05/23 0729  BP:  (!) 181/74 (!) 170/65 (!) 176/69  Pulse:  100 (!) 102 100  Resp:    18  Temp: (!) 97.5 F (36.4 C)   97.6 F (36.4 C)  TempSrc: Oral   Oral  SpO2:  100%  99%  Weight:  65.7 kg    Height:  5\' 5"  (1.651 m)     Weight change:   Physical Examination: General exam: alert awake, older than stated age HEENT:Oral mucosa moist, Ear/Nose WNL grossly Respiratory system: bilaterally clear BS,no use of accessory muscle Cardiovascular system: S1 & S2 +, No JVD. Gastrointestinal system: Abdomen soft,NT,ND, BS+ Nervous System:Alert, awake, moving extremities. Extremities: LE edema neg,distal peripheral pulses palpable.  Skin: No rashes, ++ icterus. MSK: Normal muscle bulk,tone, power  Medications reviewed:  Scheduled Meds:  insulin aspart  0-6 Units Subcutaneous Q4H   metoprolol tartrate  25 mg Oral BID   pantoprazole  40 mg Oral Daily   rivaroxaban  2.5 mg Oral BID   Continuous Infusions:  dextrose 5 %  and 0.9 % NaCl     potassium chloride      Diet Order             Diet NPO time specified  Diet effective now                  Intake/Output Summary (Last 24 hours) at 04/05/2023 1330 Last data filed at 04/05/2023 0400 Gross per 24 hour  Intake 306.67 ml  Output --  Net 306.67 ml   Net IO Since Admission: 306.67 mL [04/05/23 1330]  Wt Readings from Last 3 Encounters:  04/05/23 65.7 kg  03/14/23 65.8 kg  12/25/22 64.9 kg     Unresulted Labs (From admission, onward)     Start     Ordered   04/06/23 0500  Vitamin B12  (Anemia Panel (PNL))  Tomorrow morning,   R        04/05/23 0748   04/06/23 0500  Folate  (Anemia Panel (PNL))  Tomorrow morning,   R        04/05/23 0748   04/06/23 0500  Iron and TIBC  (Anemia Panel (PNL))  Tomorrow morning,   R        04/05/23 0748   04/06/23 0500  Ferritin  (Anemia Panel (PNL))  Tomorrow morning,   R  04/05/23 0748   04/06/23 0500  Reticulocytes  (Anemia Panel (PNL))  Tomorrow morning,   R        04/05/23 0748   04/06/23 0500  Magnesium  Tomorrow morning,   R        04/05/23 0748   04/05/23 1400  Basic metabolic panel  Once-Timed,   TIMED        04/05/23 0739   04/05/23 0500  Comprehensive metabolic panel  Daily,   R      04/05/23 0029   04/05/23 0500  CBC  Daily,   R      04/05/23 0029          Data Reviewed: I have personally reviewed following labs and imaging studies CBC: Recent Labs  Lab 04/04/23 2138 04/05/23 0205  WBC 11.0* 9.9  NEUTROABS 7.8*  --   HGB 11.3* 9.6*  HCT 34.9* 28.7*  MCV 86.6 85.4  PLT 210 171   Basic Metabolic Panel: Recent Labs  Lab 04/04/23 2138 04/05/23 0205  NA 137 138  K 3.2* 2.9*  CL 104 110  CO2 18* 17*  GLUCOSE 159* 60*  BUN 27* 25*  CREATININE 1.87* 1.75*  CALCIUM 9.1 8.4*  MG  --  1.4*   GFR: Estimated Creatinine Clearance: 22.7 mL/min (A) (by C-G formula based on SCr of 1.75 mg/dL (H)). Liver Function Tests: Recent Labs  Lab 04/04/23 2138 04/05/23 0205  AST  185* 163*  ALT 137* 115*  ALKPHOS 646* 550*  BILITOT 13.1* 11.1*  PROT 7.1 5.7*  ALBUMIN 3.0* 2.5*   Recent Labs  Lab 04/05/23 0205  LIPASE 43   No results for input(s): "AMMONIA" in the last 168 hours. Coagulation Profile: Recent Labs  Lab 04/05/23 0205  INR 1.7*  CBG: Recent Labs  Lab 04/05/23 0215 04/05/23 0258 04/05/23 0726 04/05/23 1202  GLUCAP 53* 87 136* 94  No results found for this or any previous visit (from the past 240 hour(s)).  Antimicrobials: Anti-infectives (From admission, onward)    None      Culture/Microbiology    Component Value Date/Time   SDES  09/20/2021 1448    WOUND RIGHT FOOT Performed at [facility], [address] [place], [place] [address]    SPECREQUEST  09/20/2021 1448    NONE Performed at [facility], [address] [place], [place] [address]    CULT  09/20/2021 1448    MODERATE CORYNEBACTERIUM STRIATUM Standardized susceptibility testing for this organism is not available. NO ANAEROBES ISOLATED Performed at [facility], [address] [place], [place] [address]    REPTSTATUS 09/26/2021 FINAL 09/20/2021 1448  Radiology Studies: MR ABDOMEN MRCP W WO CONTAST  Result Date: 04/05/2023 CLINICAL DATA:  82 year old female with history of jaundice. Status post laparoscopic cholecystectomy 10/12/2022. EXAM: MRI ABDOMEN WITHOUT AND WITH CONTRAST (INCLUDING MRCP) TECHNIQUE: Multiplanar multisequence MR imaging of the abdomen was performed both before and after the administration of intravenous contrast. Heavily T2-weighted images of the biliary and pancreatic ducts were obtained, and three-dimensional MRCP images were rendered by post processing. CONTRAST:  6mL GADAVIST GADOBUTROL 1 MMOL/ML IV SOLN COMPARISON:  Abdominal MRI 08/30/2022. CT of the abdomen and pelvis 04/04/2023. FINDINGS: Lower chest: Susceptibility artifact in the lower sternum from indwelling median sternotomy wires.  Hepatobiliary: Patient is status post cholecystectomy. When compared to the prior examination, there is increasingly prominent mass-like architectural distortion in the liver adjacent to the gallbladder fossa centered predominantly in segment 5. This areas  heterogeneous in signal intensity on T1 and T2 weighted images, but generally centrally T1 hypointense with heterogeneous T2 hyperintensity (including central areas that appear cystic). There is extensive hyperenhancement during arterial phase imaging in the surrounding hepatic parenchyma, but the central nidus of the lesion is estimated to measure approximately 3.5 x 2.5 x 2.0 cm on axial image 78 of series 1001 and coronal image 83 of series 12, where the lesion appears centrally hypovascular with a peripheral rim of enhancement. The peripheral hypervascularity appears to resolve rapidly on more delayed post gadolinium imaging (although subtraction imaging demonstrates persistent peripheral enhancement), and the lesion appears hypovascular centrally on delayed imaging. Diffusion imaging demonstrates heterogeneous areas of diffusion restriction associated with this lesion. MRCP images demonstrates some mild intrahepatic biliary ductal dilatation. There is signal void in the region of the common hepatic duct (coronal MRCP image 68 of series 6), which could be related to artifact from cholecystectomy clips. Immediately distal to this the common hepatic duct measures 5 mm. Cystic duct remnant is not dilated. Common bile duct is normal in caliber measuring only 4 mm in the porta hepatis. No filling defects are noted in the common bile duct to suggest choledocholithiasis. Pancreas: No pancreatic mass. No pancreatic ductal dilatation. No pancreatic or peripancreatic fluid collections or inflammatory changes. Spleen:  Unremarkable. Adrenals/Urinary Tract: Subcentimeter T1 hypointense, T2 hyperintense, nonenhancing lesion in the lower pole of the left kidney, compatible  with a tiny simple cyst (Bosniak class 1, no imaging follow-up recommended). Right kidney and bilateral adrenal glands are normal in appearance. No hydroureteronephrosis in the visualized portions of the abdomen. Stomach/Bowel: Visualized portions are unremarkable. Vascular/Lymphatic: No aneurysm identified in the visualized abdominal vasculature. No lymphadenopathy noted in the abdomen. Other: No significant volume of ascites noted in the visualized portions of the peritoneal cavity. Musculoskeletal: No aggressive appearing osseous lesions are noted in the visualized portions of the skeleton. IMPRESSION: 1. Unusual but increasingly aggressive and mass-like appearing lesion in segment 5 of the liver adjacent to the gallbladder fossa, concerning for potential neoplasm. Tissue sampling should be considered to establish a tissue diagnosis. Further evaluation with PET-CT should also be considered. 2. Mild intrahepatic biliary ductal dilatation, new compared to the preoperative study before laparoscopic cholecystectomy. Today's study demonstrates signal void in the region of the common hepatic duct. This could be artifactual and related to cholecystectomy clips, however, the possibility of postoperative stricture is not excluded. Correlation with liver function tests is recommended. Lack of common bile duct dilatation is unusual in this post cholecystectomy patient, and could suggest narrowing of the common hepatic duct. ERCP could be considered for further evaluation if clinically appropriate. 3. Additional incidental findings, as above. These results will be called to the ordering clinician or representative by the Radiologist Assistant, and communication documented in the PACS or [employer]. Electronically Signed   By: Trudie Reed M.D.   On: 04/05/2023 10:20   MR 3D Recon At Scanner  Result Date: 04/05/2023 CLINICAL DATA:  82 year old female with history of jaundice. Status post laparoscopic  cholecystectomy 10/12/2022. EXAM: MRI ABDOMEN WITHOUT AND WITH CONTRAST (INCLUDING MRCP) TECHNIQUE: Multiplanar multisequence MR imaging of the abdomen was performed both before and after the administration of intravenous contrast. Heavily T2-weighted images of the biliary and pancreatic ducts were obtained, and three-dimensional MRCP images were rendered by post processing. CONTRAST:  6mL GADAVIST GADOBUTROL 1 MMOL/ML IV SOLN COMPARISON:  Abdominal MRI 08/30/2022. CT of the abdomen and pelvis 04/04/2023. FINDINGS: Lower chest: Susceptibility artifact in the lower  sternum from indwelling median sternotomy wires. Hepatobiliary: Patient is status post cholecystectomy. When compared to the prior examination, there is increasingly prominent mass-like architectural distortion in the liver adjacent to the gallbladder fossa centered predominantly in segment 5. This areas heterogeneous in signal intensity on T1 and T2 weighted images, but generally centrally T1 hypointense with heterogeneous T2 hyperintensity (including central areas that appear cystic). There is extensive hyperenhancement during arterial phase imaging in the surrounding hepatic parenchyma, but the central nidus of the lesion is estimated to measure approximately 3.5 x 2.5 x 2.0 cm on axial image 78 of series 1001 and coronal image 83 of series 12, where the lesion appears centrally hypovascular with a peripheral rim of enhancement. The peripheral hypervascularity appears to resolve rapidly on more delayed post gadolinium imaging (although subtraction imaging demonstrates persistent peripheral enhancement), and the lesion appears hypovascular centrally on delayed imaging. Diffusion imaging demonstrates heterogeneous areas of diffusion restriction associated with this lesion. MRCP images demonstrates some mild intrahepatic biliary ductal dilatation. There is signal void in the region of the common hepatic duct (coronal MRCP image 68 of series 6), which could  be related to artifact from cholecystectomy clips. Immediately distal to this the common hepatic duct measures 5 mm. Cystic duct remnant is not dilated. Common bile duct is normal in caliber measuring only 4 mm in the porta hepatis. No filling defects are noted in the common bile duct to suggest choledocholithiasis. Pancreas: No pancreatic mass. No pancreatic ductal dilatation. No pancreatic or peripancreatic fluid collections or inflammatory changes. Spleen:  Unremarkable. Adrenals/Urinary Tract: Subcentimeter T1 hypointense, T2 hyperintense, nonenhancing lesion in the lower pole of the left kidney, compatible with a tiny simple cyst (Bosniak class 1, no imaging follow-up recommended). Right kidney and bilateral adrenal glands are normal in appearance. No hydroureteronephrosis in the visualized portions of the abdomen. Stomach/Bowel: Visualized portions are unremarkable. Vascular/Lymphatic: No aneurysm identified in the visualized abdominal vasculature. No lymphadenopathy noted in the abdomen. Other: No significant volume of ascites noted in the visualized portions of the peritoneal cavity. Musculoskeletal: No aggressive appearing osseous lesions are noted in the visualized portions of the skeleton. IMPRESSION: 1. Unusual but increasingly aggressive and mass-like appearing lesion in segment 5 of the liver adjacent to the gallbladder fossa, concerning for potential neoplasm. Tissue sampling should be considered to establish a tissue diagnosis. Further evaluation with PET-CT should also be considered. 2. Mild intrahepatic biliary ductal dilatation, new compared to the preoperative study before laparoscopic cholecystectomy. Today's study demonstrates signal void in the region of the common hepatic duct. This could be artifactual and related to cholecystectomy clips, however, the possibility of postoperative stricture is not excluded. Correlation with liver function tests is recommended. Lack of common bile duct  dilatation is unusual in this post cholecystectomy patient, and could suggest narrowing of the common hepatic duct. ERCP could be considered for further evaluation if clinically appropriate. 3. Additional incidental findings, as above. These results will be called to the ordering clinician or representative by the Radiologist Assistant, and communication documented in the PACS or [employer]. Electronically Signed   By: Trudie Reed M.D.   On: 04/05/2023 10:20   CT ABDOMEN PELVIS WO CONTRAST  Result Date: 04/04/2023 CLINICAL DATA:  Jaundice, concern for malignancy. EXAM: CT ABDOMEN AND PELVIS WITHOUT CONTRAST TECHNIQUE: Multidetector CT imaging of the abdomen and pelvis was performed following the standard protocol without IV contrast. RADIATION DOSE REDUCTION: This exam was performed according to the departmental dose-optimization program which includes automated exposure control, adjustment  of the mA and/or kV according to patient size and/or use of iterative reconstruction technique. COMPARISON:  MRI abdomen 08/30/2022.  CT abdomen 08/14/2022. FINDINGS: Lower chest: No acute abnormality. Hepatobiliary: There some ill-defined heterogeneous hypodensity in the region of the gallbladder fossa measuring 2.4 x 3.0 cm this appears similar to the prior study given lack of contrast. Gallbladder is surgically absent. There is no biliary ductal dilatation. Pancreas: Unremarkable. No pancreatic ductal dilatation or surrounding inflammatory changes. Spleen: Normal in size without focal abnormality. Adrenals/Urinary Tract: There is a 3 mm calculus in the right kidney. There is no hydronephrosis or perinephric fat stranding. Left kidney, ureters and bladder are normal. Stomach/Bowel: Stomach is within normal limits. No evidence of bowel wall thickening, distention, or inflammatory changes. The appendix is not seen. There is a large amount of stool throughout colon. Vascular/Lymphatic: There severe atherosclerotic  calcifications of the aorta, iliac arteries and branch vessels. There are no enlarged lymph nodes identified. Vascular stents are seen in the femoral regions along with postsurgical change and scarring bilaterally. Reproductive: Uterus and bilateral adnexa are unremarkable. Other: No abdominal wall hernia or abnormality. No abdominopelvic ascites. Musculoskeletal: There are few scattered rounded lucent areas in the vertebral bodies particularly at L2. These are similar to prior. IMPRESSION: 1. Ill-defined hypodensity in the region of the gallbladder fossa appears similar to the prior study given lack of contrast. This would be better evaluated with dedicated MRI. 2. Nonobstructing right renal calculus. 3. Large amount of stool throughout the colon. 4. Stable indeterminate lucent areas in the vertebral bodies of the lumbar spine. 5. Severe atherosclerotic disease. Aortic Atherosclerosis (ICD10-I70.0). Electronically Signed   By: Darliss Cheney M.D.   On: 04/04/2023 23:19     LOS: 1 day   Lanae Boast, MD [facility]  04/05/2023, 1:30 PM

## 2023-04-05 NOTE — Progress Notes (Signed)
Name [name], [place] MRN [id number] Demographics 06-29-1941, 82 y.o. Dorris Fetch, MD Location [phone] Pt asking if you can order her eye drops that takes at home 2xday. Brimonidine, also her BP is in the 180's. She takes at home metoprolol 37.5mg  2xday but here was order only 25mg  2xday. Also she is refusing to take her Xarelto tonight according to the note they wan to hold Xarelto on Sunday night for her biopsy on Monday. She understand all this information but stated that she is choosing to refuse it tonight and tomorrow.

## 2023-04-05 NOTE — Consult Note (Addendum)
[name] 1940/09/29  [id number].    Requesting MD: Lanae Boast, MD Chief Complaint/Reason for Consult: painless jaundice, history of cholecystectomy   HPI:  [name] is an 82 y/o F with PMH Diabetes, PVD, CAD, GERD, and fenestrating subtotal cholecystectomy 10/12/22 by Dr. Freida Busman who presents to the hospital due to painless jaundice. She reports one week of bloating after meals and belching. She reports mild epigastric discomfort but denies abdominal pain. She reports she is passing gas and having tan colored stools that are loose, she says this is her baseline. She denies nausea or vomiting. She was last seen by Dr. Freida Busman in march, during which time she had recovered well from her cholecystectomy without evidence of bile leak. Patient tells me that she currently takes xarelto, prescribed by Dr. Lenell Antu, for "blood flow" -- per chart review she had an angioplasty of her previous lower extremity bypass in February 2024.  ROS: Review of Systems  All other systems reviewed and are negative.   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 There is a small calcified mass in the L:A near the LA appendage. No valvular masses seen with associated mitral annular calcification. LA Volume/  BSA27.4 ml/m2 No AS. Right ventricular systolic pressure is elevated at .   Myocardial infarction [facility])    June 2021   Neuromuscular disorder (HCC)    neuropathy in bilateral feet   Peripheral vascular disease (HCC)    Pneumonia    not hosp.    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  PTCAs with drug coated balloon Lt ant tibial artery and Lt popliteal artery  11/26/2019    Past Surgical History:  Procedure Laterality Date   ABDOMINAL AORTAGRAM  11/25/2019   ABDOMINAL AORTOGRAM W/LOWER EXTREMITY    ABDOMINAL AORTOGRAM N/A 06/15/2019   Procedure: ABDOMINAL AORTOGRAM;  Surgeon: Runell Gess, MD;  [facility];  Service: Cardiovascular;  Laterality: N/A;   ABDOMINAL AORTOGRAM W/LOWER EXTREMITY N/A 11/25/2019   Procedure: ABDOMINAL AORTOGRAM W/LOWER EXTREMITY;  Surgeon: Iran Ouch, MD;  [facility];  Service: Cardiovascular;  Laterality: N/A;  Lt leg   ABDOMINAL AORTOGRAM W/LOWER EXTREMITY N/A 08/01/2020   Procedure: ABDOMINAL AORTOGRAM W/LOWER EXTREMITY;  Surgeon: Runell Gess, MD;  [facility];  Service: Cardiovascular;  Laterality: N/A;   ABDOMINAL AORTOGRAM W/LOWER EXTREMITY N/A 04/28/2021   Procedure: ABDOMINAL AORTOGRAM W/LOWER EXTREMITY;  Surgeon: Sherren Kerns, MD;  [facility];  Service: Cardiovascular;  Laterality: N/A;   ABDOMINAL AORTOGRAM W/LOWER EXTREMITY N/A 10/13/2021  Procedure: ABDOMINAL AORTOGRAM W/LOWER EXTREMITY;  Surgeon: Leonie Douglas, MD;  [facility];  Service: Cardiovascular;  Laterality: N/A;   ABDOMINAL AORTOGRAM W/LOWER EXTREMITY N/A 11/09/2022   Procedure: ABDOMINAL AORTOGRAM W/LOWER EXTREMITY;  Surgeon: Leonie Douglas, MD;  [facility];  Service: Cardiovascular;  Laterality: N/A;   ABDOMINAL AORTOGRAM W/LOWER EXTREMITY N/A 11/16/2022   Procedure: ABDOMINAL AORTOGRAM W/LOWER EXTREMITY;  Surgeon: Leonie Douglas, MD;   [facility];  Service: Cardiovascular;  Laterality: N/A;   AMPUTATION TOE Left 05/03/2021   Procedure: AMPUTATION OF THIRD LEFT  TOE;  Surgeon: Leonie Douglas, MD;  Location: [facility] OR;  Service: Vascular;  Laterality: Left;   APPENDECTOMY     APPLICATION OF WOUND VAC Right 07/21/2019   Procedure: Application Of Prevena Wound Vac Right Groin;  Surgeon: Nada Libman, MD;  [facility];  Service: Vascular;  Laterality: Right;   CARPAL TUNNEL RELEASE Left    CARPAL TUNNEL RELEASE Right    CESAREAN SECTION     x 2   CHOLECYSTECTOMY N/A 10/12/2022   Procedure: LAPAROSCOPIC PARTIAL FENESTRATING CHOLECYSTECTOMY;  Surgeon: Fritzi Mandes, MD;  [facility];  Service: General;  Laterality: N/A;   COLONOSCOPY     CORONARY ARTERY BYPASS GRAFT N/A 03/24/2020   Procedure: CORONARY ARTERY BYPASS GRAFTING (CABG) using LIMA to LAD (m); RIMA to RAMUS; Endoscopic Right Greater Saphenous Vein: SVG to Diag1; SVG to PLB (right); and SVG to PL (left).;  Surgeon: Linden Dolin, MD;  [facility];  Service: Open Heart Surgery;  Laterality: N/A;  BILATERAL IMA   ENDARTERECTOMY FEMORAL Right 07/21/2019   Procedure: RIGHT ENDARTERECTOMY FEMORAL WITH PATCH ANGIOPLASTY;  Surgeon: Nada Libman, MD;  [facility];  Service: Vascular;  Laterality: Right;   ENDARTERECTOMY FEMORAL Right 08/16/2020   Procedure: RIGHT FEMORAL ENDARTERECTOMY;  Surgeon: Maeola Harman, MD;  Location: [facility] OR;  Service: Vascular;  Laterality: Right;   ENDOVEIN HARVEST OF GREATER SAPHENOUS VEIN Right 03/24/2020   Procedure: ENDOVEIN HARVEST OF GREATER SAPHENOUS VEIN;  Surgeon: Linden Dolin, MD;  [facility];  Service: Open Heart Surgery;  Laterality: Right;   EYE SURGERY     cataract removal bilaterally   FEMORAL-POPLITEAL BYPASS GRAFT Right 08/16/2020   Procedure: BYPASS GRAFT FEMORAL-POPLITEAL ARTERY;  Surgeon: Maeola Harman, MD;  Location[facility] OR;  Service: Vascular;   Laterality: Right;   FEMORAL-POPLITEAL BYPASS GRAFT Left 05/03/2021   Procedure: LEFT FEMORAL TO BELOW KNEE POPLITEAL ARTERY BYPASS GRAFTING WITH 6MMX80 PTFE GRAFT;  Surgeon: Leonie Douglas, MD;  [facility];  Service: Vascular;  Laterality: Left;   LEFT HEART CATH AND CORONARY ANGIOGRAPHY N/A 03/22/2020   Procedure: LEFT HEART CATH AND CORONARY ANGIOGRAPHY;  Surgeon: Swaziland, Peter M, MD;  Location: [facility] INVASIVE CV LAB;  Service: Cardiovascular;  Laterality: N/A;   LOWER EXTREMITY ANGIOGRAPHY Bilateral 06/15/2019   Procedure: Lower Extremity Angiography;  Surgeon: Runell Gess, MD;  Location: [facility] INVASIVE CV LAB;  Service: Cardiovascular;  Laterality: Bilateral;   LOWER EXTREMITY ANGIOGRAPHY Right 08/03/2019   Procedure: LOWER EXTREMITY ANGIOGRAPHY;  Surgeon: Runell Gess, MD;  [facility];  Service: Cardiovascular;  Laterality: Right;   LOWER EXTREMITY ANGIOGRAPHY N/A 09/07/2021   Procedure: LOWER EXTREMITY ANGIOGRAPHY;  Surgeon: Cephus Shelling, MD;  [facility];  Service: Cardiovascular;  Laterality: N/A;   PATCH ANGIOPLASTY Right 07/21/2019   Procedure: Patch Angioplasty Right Femoral Artery;  Surgeon: Nada Libman, MD;  Location: [facility] OR;  Service: Vascular;  Laterality: Right;   PERIPHERAL INTRAVASCULAR LITHOTRIPSY  11/25/2019   Procedure: INTRAVASCULAR LITHOTRIPSY;  Surgeon: Iran Ouch, MD;  [facility];  Service: Cardiovascular;;  LT. SFA   PERIPHERAL VASCULAR ATHERECTOMY  08/03/2019   Procedure: PERIPHERAL VASCULAR ATHERECTOMY;  Surgeon: Runell Gess, MD;  Location: [facility] INVASIVE CV LAB;  Service: Cardiovascular;;  right SFA, right TP trunk   PERIPHERAL VASCULAR ATHERECTOMY  11/25/2019   Procedure: PERIPHERAL VASCULAR ATHERECTOMY;  Surgeon: Iran Ouch, MD;  [facility];  Service: Cardiovascular;;  Lt.  POPLITEAL and AT   PERIPHERAL VASCULAR BALLOON ANGIOPLASTY Left 06/15/2019    Procedure: PERIPHERAL VASCULAR BALLOON ANGIOPLASTY;  Surgeon: Runell Gess, MD;  [facility];  Service: Cardiovascular;  Laterality: Left;  SFA UNSUCCESSFUL UNABLE TO CROSS LESION   PERIPHERAL VASCULAR BALLOON ANGIOPLASTY  08/03/2019   Procedure: PERIPHERAL VASCULAR BALLOON ANGIOPLASTY;  Surgeon: Runell Gess, MD;  [facility];  Service: Cardiovascular;;  right SFA, Right TP trunk   PERIPHERAL VASCULAR BALLOON ANGIOPLASTY  09/07/2021   Procedure: PERIPHERAL VASCULAR BALLOON ANGIOPLASTY;  Surgeon: Cephus Shelling, MD;  [facility];  Service: Cardiovascular;;   PERIPHERAL VASCULAR BALLOON ANGIOPLASTY Right 10/13/2021   Procedure: PERIPHERAL VASCULAR BALLOON ANGIOPLASTY;  Surgeon: Leonie Douglas, MD;  [facility];  Service: Cardiovascular;  Laterality: Right;   PERIPHERAL VASCULAR BALLOON ANGIOPLASTY  11/09/2022   Procedure: PERIPHERAL VASCULAR BALLOON ANGIOPLASTY;  Surgeon: Leonie Douglas, MD;  [facility];  Service: Cardiovascular;;  fem-pop bypass and AT   TEE WITHOUT CARDIOVERSION N/A 03/24/2020   Procedure: TRANSESOPHAGEAL ECHOCARDIOGRAM (TEE);  Surgeon: Linden Dolin, MD;  Location: [facility] OR;  Service: Open Heart Surgery;  Laterality: N/A;   TONSILLECTOMY     and adenoidectomy   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/2019   Procedure: TRANSMETATARSAL AMPUTATION;  Surgeon: Vivi Barrack, DPM;  [facility];  Service: Podiatry;  Laterality: Right;   TRANSMETATARSAL AMPUTATION Left 05/11/2021   Procedure: LEFT TRANSMETATARSAL AMPUTATION;  Surgeon: Maeola Harman, MD;  Location: [facility] OR;  Service: Vascular;  Laterality: Left;   TUBAL LIGATION      Social History:  reports that she quit smoking about 51 years ago. Her smoking use included cigarettes. She has never used smokeless tobacco. She reports that she does not drink alcohol and does not use drugs.  Allergies:  Allergies   Allergen Reactions   Versed [Midazolam] Anxiety    Frantic, out of my mind, agitated    Augmentin [Amoxicillin-Pot Clavulanate] Other (See Comments)    Elevates potassium level   Bactrim [Sulfamethoxazole-Trimethoprim] Other (See Comments)    ^ K+( elevated)    Demerol [Meperidine]     Delusional    Meperidine Hcl     Other reaction(s): delusional   Scopolamine     Delusional    Tramadol Other (See Comments)    Keeps awake    Facility-Administered Medications Prior to Admission  Medication Dose Route Frequency Provider Last Rate Last Admin   sodium chloride flush (NS) 0.9 % injection 3 mL  3 mL Intravenous Q12H Runell Gess, MD       Medications Prior to Admission  Medication Sig Dispense Refill   acetaminophen (TYLENOL) 500 MG tablet Take 1 tablet (500 mg total) by mouth every 8 (eight) hours as needed for mild pain. 30 tablet 0  Ascorbic Acid (VITA-C PO)      aspirin EC 81 MG tablet Take 81 mg by mouth at bedtime. Swallow whole.     atorvastatin (LIPITOR) 80 MG tablet TAKE 1 TABLET BY MOUTH ONCE DAILY. MUST  KEEP  UPCOMING  APPOINTMENT  FOR  FUTURE  REFILLS 90 tablet 0   brimonidine (ALPHAGAN) 0.2 % ophthalmic solution Place 1 drop into the right eye 2 (two) times daily.     Cholecalciferol (VITAMIN D3 GUMMIES PO) Take 2 tablets by mouth at bedtime.     CINNAMON PO Take 2,400 mg by mouth in the morning, at noon, and at bedtime. 1200 mg each Breakfast, supper & bedtime.     Cyanocobalamin (B-12 PO) Take 1,000 mcg by mouth daily with supper.     hydrochlorothiazide (HYDRODIURIL) 12.5 MG tablet Take 1 tablet by mouth once daily 90 tablet 0   Insulin Lispro Prot & Lispro (HUMALOG MIX 75/25 KWIKPEN) (75-25) 100 UNIT/ML Kwikpen INJECT 15 UNITS SUBCUTANEOUSLY ONCE DAILY WITH BREAKFAST 15 mL 0   Insulin Pen Needle (ULTRA-THIN II MINI PEN NEEDLE) 31G X 5 MM MISC USE AS DIRECTED 50 each 2   JARDIANCE 25 MG TABS tablet TAKE 1 TABLET BY MOUTH ONCE DAILY BEFORE BREAKFAST (Patient  taking differently: Take 25 mg by mouth See admin instructions. Take 1 tablet (25 mg) by mouth in the evening as needed with high carbohydrates evening meals) 90 tablet 3   LAGEVRIO 200 MG CAPS capsule Take 4 capsules by mouth every 12 (twelve) hours.     Lancets (ONETOUCH DELICA PLUS LANCET33G) MISC Use to check blood sugars twice daily. (Patient taking differently: Use to check blood sugars  daily.) 100 each 5   Menatetrenone (VITAMIN K2) 100 MCG TABS Take 100 mcg by mouth at bedtime.     metFORMIN (GLUCOPHAGE) 1000 MG tablet Take 1 tablet by mouth twice daily 180 tablet 3   metoprolol tartrate (LOPRESSOR) 25 MG tablet TAKE 1 & 1/2 (ONE & ONE-HALF) TABLETS BY MOUTH TWICE DAILY 270 tablet 2   mupirocin ointment (BACTROBAN) 2 % Apply 1 Application topically 2 (two) times daily. 30 g 2   ONETOUCH VERIO test strip USE TO CHECK BLOOD SUGARS ONCE DAILY 100 each 2   pantoprazole (PROTONIX) 40 MG tablet Take 1 tablet by mouth once daily (Patient taking differently: Take 40 mg by mouth See admin instructions. Take Sun, Mon., Wed., and Friday in the evening) 90 tablet 3   Probiotic Product ([employer] SENIOR PROBIOTIC PO) Take 1 capsule by mouth every evening.     Propylene Glycol (SYSTANE BALANCE) 0.6 % SOLN Place 1 drop into the left eye 3 (three) times daily as needed (dry/irritated eyes).     repaglinide (PRANDIN) 2 MG tablet Take 1 tab by breakfast and 1 tab before dinner (Patient taking differently: Take 2 mg by mouth 2 (two) times daily before a meal.) 180 tablet 2   rivaroxaban (XARELTO) 2.5 MG TABS tablet Take 1 tablet by mouth twice daily 60 tablet 1   telmisartan (MICARDIS) 80 MG tablet Take 1 tablet by mouth once daily 90 tablet 3     Physical Exam: Blood pressure (!) 176/69, pulse 100, temperature 97.6 F (36.4 C), temperature source Oral, resp. rate 18, height 5\' 5"  (1.651 m), weight 65.7 kg, SpO2 99%. General: Pleasant female laying on hospital bed, jaundice present, NAD HEENT: head  -normocephalic, atraumatic; Eyes: Pupils equal and round Ears- no external lesions or tenderness Neck- Trachea is midline CV- RRR, normal S1/S2, no  M/R/G, R foot s/p transmetarsal amputation, no edema  Pulm- breathing is non-labored. CTABL, no wheezes, rhales, rhonchi. Abd- soft, NT/ND, appropriate bowel sounds in 4 quadrants, no masses, hernias, or organomegaly.  GU- deferred  MSK- UE/LE symmetrical, no cyanosis, clubbing, or edema. Neuro- CN II-XII grossly in tact, no paresthesias. Psych- Alert and Oriented x3 with appropriate affect Skin: warm and dry, no rashes or lesions   Results for orders placed or performed during the hospital encounter of 04/04/23 (from the past 48 hour(s))  CBC with Differential     Status: Abnormal   Collection Time: 04/04/23  9:38 PM  Result Value Ref Range   WBC 11.0 (H) 4.0 - 10.5 K/uL   RBC 4.03 3.87 - 5.11 MIL/uL   Hemoglobin 11.3 (L) 12.0 - 15.0 g/dL   HCT 44.0 (L) 34.7 - 42.5 %   MCV 86.6 80.0 - 100.0 fL   MCH 28.0 26.0 - 34.0 pg   MCHC 32.4 30.0 - 36.0 g/dL   RDW 95.6 (H) 38.7 - 56.4 %   Platelets 210 150 - 400 K/uL   nRBC 0.0 0.0 - 0.2 %   Neutrophils Relative % 71 %   Neutro Abs 7.8 (H) 1.7 - 7.7 K/uL   Lymphocytes Relative 15 %   Lymphs Abs 1.6 0.7 - 4.0 K/uL   Monocytes Relative 12 %   Monocytes Absolute 1.3 (H) 0.1 - 1.0 K/uL   Eosinophils Relative 1 %   Eosinophils Absolute 0.1 0.0 - 0.5 K/uL   Basophils Relative 1 %   Basophils Absolute 0.1 0.0 - 0.1 K/uL   Immature Granulocytes 0 %   Abs Immature Granulocytes 0.04 0.00 - 0.07 K/uL    Comment: Performed at [facility], [address], [place], [place] [address]  Comprehensive metabolic panel     Status: Abnormal   Collection Time: 04/04/23  9:38 PM  Result Value Ref Range   Sodium 137 135 - 145 mmol/L   Potassium 3.2 (L) 3.5 - 5.1 mmol/L   Chloride 104 98 - 111 mmol/L   CO2 18 (L) 22 - 32 mmol/L   Glucose, Bld 159 (H) 70 - 99 mg/dL    Comment: Glucose reference range  applies only to samples taken after fasting for at least 8 hours.   BUN 27 (H) 8 - 23 mg/dL   Creatinine, Ser 1.88 (H) 0.44 - 1.00 mg/dL   Calcium 9.1 8.9 - 41.6 mg/dL   Total Protein 7.1 6.5 - 8.1 g/dL   Albumin 3.0 (L) 3.5 - 5.0 g/dL   AST 606 (H) 15 - 41 U/L   ALT 137 (H) 0 - 44 U/L   Alkaline Phosphatase 646 (H) 38 - 126 U/L   Total Bilirubin 13.1 (H) 0.3 - 1.2 mg/dL   GFR, Estimated 27 (L) >60 mL/min    Comment: (NOTE) Calculated using the CKD-EPI Creatinine Equation (2021)    Anion gap 15 5 - 15    Comment: Performed at [facility] Lab, [address] [place], [place] [address]  Urinalysis, Routine w reflex microscopic -Urine, Clean Catch     Status: Abnormal   Collection Time: 04/04/23  9:40 PM  Result Value Ref Range   Color, Urine AMBER (A) YELLOW    Comment: BIOCHEMICALS MAY BE AFFECTED BY COLOR   APPearance HAZY (A) CLEAR   Specific Gravity, Urine 1.007 1.005 - 1.030   pH 5.0 5.0 - 8.0   Glucose, UA >=500 (A) NEGATIVE mg/dL   Hgb urine  dipstick MODERATE (A) NEGATIVE   Bilirubin Urine NEGATIVE NEGATIVE   Ketones, ur NEGATIVE NEGATIVE mg/dL   Protein, ur 30 (A) NEGATIVE mg/dL   Nitrite NEGATIVE NEGATIVE   Leukocytes,Ua NEGATIVE NEGATIVE   RBC / HPF 0-5 0 - 5 RBC/hpf   WBC, UA 0-5 0 - 5 WBC/hpf   Bacteria, UA RARE (A) NONE SEEN   Squamous Epithelial / HPF 0-5 0 - 5 /HPF   Mucus PRESENT     Comment: Performed at [facility], [address] [place], [place] [address]  Hepatitis panel, acute     Status: None   Collection Time: 04/04/23 10:45 PM  Result Value Ref Range   Hepatitis B Surface Ag NON REACTIVE NON REACTIVE   HCV Ab NON REACTIVE NON REACTIVE    Comment: (NOTE) Nonreactive HCV antibody screen is consistent with no HCV infections,  unless recent infection is suspected or other evidence exists to indicate HCV infection.     Hep A IgM NON REACTIVE NON REACTIVE   Hep B C IgM NON REACTIVE NON REACTIVE    Comment: Performed at [facility]  Lab, [address] [place], [place] [address]  Comprehensive metabolic panel     Status: Abnormal   Collection Time: 04/05/23  2:05 AM  Result Value Ref Range   Sodium 138 135 - 145 mmol/L   Potassium 2.9 (L) 3.5 - 5.1 mmol/L   Chloride 110 98 - 111 mmol/L   CO2 17 (L) 22 - 32 mmol/L   Glucose, Bld 60 (L) 70 - 99 mg/dL    Comment: Glucose reference range applies only to samples taken after fasting for at least 8 hours.   BUN 25 (H) 8 - 23 mg/dL   Creatinine, Ser 9.62 (H) 0.44 - 1.00 mg/dL   Calcium 8.4 (L) 8.9 - 10.3 mg/dL   Total Protein 5.7 (L) 6.5 - 8.1 g/dL   Albumin 2.5 (L) 3.5 - 5.0 g/dL   AST 952 (H) 15 - 41 U/L   ALT 115 (H) 0 - 44 U/L   Alkaline Phosphatase 550 (H) 38 - 126 U/L   Total Bilirubin 11.1 (H) 0.3 - 1.2 mg/dL   GFR, Estimated 29 (L) >60 mL/min    Comment: (NOTE) Calculated using the CKD-EPI Creatinine Equation (2021)    Anion gap 11 5 - 15    Comment: Performed at [facility] Lab, [address] [place], [place] [address]  Magnesium     Status: Abnormal   Collection Time: 04/05/23  2:05 AM  Result Value Ref Range   Magnesium 1.4 (L) 1.7 - 2.4 mg/dL    Comment: Performed at [facility], [address] [place], [place] [address]  CBC     Status: Abnormal   Collection Time: 04/05/23  2:05 AM  Result Value Ref Range   WBC 9.9 4.0 - 10.5 K/uL   RBC 3.36 (L) 3.87 - 5.11 MIL/uL   Hemoglobin 9.6 (L) 12.0 - 15.0 g/dL   HCT 27.2 (L) 53.6 - 64.4 %   MCV 85.4 80.0 - 100.0 fL   MCH 28.6 26.0 - 34.0 pg   MCHC 33.4 30.0 - 36.0 g/dL   RDW 03.4 (H) 74.2 - 59.5 %   Platelets 171 150 - 400 K/uL    Comment: REPEATED TO VERIFY   nRBC 0.0 0.0 - 0.2 %    Comment: Performed at [facility], [address] [place], [place] [address]  Lipase, blood  Status: None   Collection Time: 04/05/23  2:05 AM  Result Value Ref Range   Lipase 43 11 - 51 U/L    Comment: Performed at [facility], [address], [place], [place] [address]  Protime-INR      Status: Abnormal   Collection Time: 04/05/23  2:05 AM  Result Value Ref Range   Prothrombin Time 20.6 (H) 11.4 - 15.2 seconds   INR 1.7 (H) 0.8 - 1.2    Comment: (NOTE) INR goal varies based on device and disease states. Performed at [facility], [address] [place], [place] [address]   CBG monitoring, ED     Status: Abnormal   Collection Time: 04/05/23  2:15 AM  Result Value Ref Range   Glucose-Capillary 53 (L) 70 - 99 mg/dL    Comment: Glucose reference range applies only to samples taken after fasting for at least 8 hours.  Glucose, capillary     Status: None   Collection Time: 04/05/23  2:58 AM  Result Value Ref Range   Glucose-Capillary 87 70 - 99 mg/dL    Comment: Glucose reference range applies only to samples taken after fasting for at least 8 hours.  Glucose, capillary     Status: Abnormal   Collection Time: 04/05/23  7:26 AM  Result Value Ref Range   Glucose-Capillary 136 (H) 70 - 99 mg/dL    Comment: Glucose reference range applies only to samples taken after fasting for at least 8 hours.  Glucose, capillary     Status: None   Collection Time: 04/05/23 12:02 PM  Result Value Ref Range   Glucose-Capillary 94 70 - 99 mg/dL    Comment: Glucose reference range applies only to samples taken after fasting for at least 8 hours.   MR ABDOMEN MRCP W WO CONTAST  Result Date: 04/05/2023 CLINICAL DATA:  82 year old female with history of jaundice. Status post laparoscopic cholecystectomy 10/12/2022. EXAM: MRI ABDOMEN WITHOUT AND WITH CONTRAST (INCLUDING MRCP) TECHNIQUE: Multiplanar multisequence MR imaging of the abdomen was performed both before and after the administration of intravenous contrast. Heavily T2-weighted images of the biliary and pancreatic ducts were obtained, and three-dimensional MRCP images were rendered by post processing. CONTRAST:  6mL GADAVIST GADOBUTROL 1 MMOL/ML IV SOLN COMPARISON:  Abdominal MRI 08/30/2022. CT of the abdomen and pelvis 04/04/2023.  FINDINGS: Lower chest: Susceptibility artifact in the lower sternum from indwelling median sternotomy wires. Hepatobiliary: Patient is status post cholecystectomy. When compared to the prior examination, there is increasingly prominent mass-like architectural distortion in the liver adjacent to the gallbladder fossa centered predominantly in segment 5. This areas heterogeneous in signal intensity on T1 and T2 weighted images, but generally centrally T1 hypointense with heterogeneous T2 hyperintensity (including central areas that appear cystic). There is extensive hyperenhancement during arterial phase imaging in the surrounding hepatic parenchyma, but the central nidus of the lesion is estimated to measure approximately 3.5 x 2.5 x 2.0 cm on axial image 78 of series 1001 and coronal image 83 of series 12, where the lesion appears centrally hypovascular with a peripheral rim of enhancement. The peripheral hypervascularity appears to resolve rapidly on more delayed post gadolinium imaging (although subtraction imaging demonstrates persistent peripheral enhancement), and the lesion appears hypovascular centrally on delayed imaging. Diffusion imaging demonstrates heterogeneous areas of diffusion restriction associated with this lesion. MRCP images demonstrates some mild intrahepatic biliary ductal dilatation. There is signal void in the region of the common hepatic duct (coronal MRCP image 68 of series  6), which could be related to artifact from cholecystectomy clips. Immediately distal to this the common hepatic duct measures 5 mm. Cystic duct remnant is not dilated. Common bile duct is normal in caliber measuring only 4 mm in the porta hepatis. No filling defects are noted in the common bile duct to suggest choledocholithiasis. Pancreas: No pancreatic mass. No pancreatic ductal dilatation. No pancreatic or peripancreatic fluid collections or inflammatory changes. Spleen:  Unremarkable. Adrenals/Urinary Tract:  Subcentimeter T1 hypointense, T2 hyperintense, nonenhancing lesion in the lower pole of the left kidney, compatible with a tiny simple cyst (Bosniak class 1, no imaging follow-up recommended). Right kidney and bilateral adrenal glands are normal in appearance. No hydroureteronephrosis in the visualized portions of the abdomen. Stomach/Bowel: Visualized portions are unremarkable. Vascular/Lymphatic: No aneurysm identified in the visualized abdominal vasculature. No lymphadenopathy noted in the abdomen. Other: No significant volume of ascites noted in the visualized portions of the peritoneal cavity. Musculoskeletal: No aggressive appearing osseous lesions are noted in the visualized portions of the skeleton. IMPRESSION: 1. Unusual but increasingly aggressive and mass-like appearing lesion in segment 5 of the liver adjacent to the gallbladder fossa, concerning for potential neoplasm. Tissue sampling should be considered to establish a tissue diagnosis. Further evaluation with PET-CT should also be considered. 2. Mild intrahepatic biliary ductal dilatation, new compared to the preoperative study before laparoscopic cholecystectomy. Today's study demonstrates signal void in the region of the common hepatic duct. This could be artifactual and related to cholecystectomy clips, however, the possibility of postoperative stricture is not excluded. Correlation with liver function tests is recommended. Lack of common bile duct dilatation is unusual in this post cholecystectomy patient, and could suggest narrowing of the common hepatic duct. ERCP could be considered for further evaluation if clinically appropriate. 3. Additional incidental findings, as above. These results will be called to the ordering clinician or representative by the Radiologist Assistant, and communication documented in the PACS or [employer]. Electronically Signed   By: Trudie Reed M.D.   On: 04/05/2023 10:20   MR 3D Recon At  Scanner  Result Date: 04/05/2023 CLINICAL DATA:  82 year old female with history of jaundice. Status post laparoscopic cholecystectomy 10/12/2022. EXAM: MRI ABDOMEN WITHOUT AND WITH CONTRAST (INCLUDING MRCP) TECHNIQUE: Multiplanar multisequence MR imaging of the abdomen was performed both before and after the administration of intravenous contrast. Heavily T2-weighted images of the biliary and pancreatic ducts were obtained, and three-dimensional MRCP images were rendered by post processing. CONTRAST:  6mL GADAVIST GADOBUTROL 1 MMOL/ML IV SOLN COMPARISON:  Abdominal MRI 08/30/2022. CT of the abdomen and pelvis 04/04/2023. FINDINGS: Lower chest: Susceptibility artifact in the lower sternum from indwelling median sternotomy wires. Hepatobiliary: Patient is status post cholecystectomy. When compared to the prior examination, there is increasingly prominent mass-like architectural distortion in the liver adjacent to the gallbladder fossa centered predominantly in segment 5. This areas heterogeneous in signal intensity on T1 and T2 weighted images, but generally centrally T1 hypointense with heterogeneous T2 hyperintensity (including central areas that appear cystic). There is extensive hyperenhancement during arterial phase imaging in the surrounding hepatic parenchyma, but the central nidus of the lesion is estimated to measure approximately 3.5 x 2.5 x 2.0 cm on axial image 78 of series 1001 and coronal image 83 of series 12, where the lesion appears centrally hypovascular with a peripheral rim of enhancement. The peripheral hypervascularity appears to resolve rapidly on more delayed post gadolinium imaging (although subtraction imaging demonstrates persistent peripheral enhancement), and the lesion appears hypovascular centrally on delayed  imaging. Diffusion imaging demonstrates heterogeneous areas of diffusion restriction associated with this lesion. MRCP images demonstrates some mild intrahepatic biliary ductal  dilatation. There is signal void in the region of the common hepatic duct (coronal MRCP image 68 of series 6), which could be related to artifact from cholecystectomy clips. Immediately distal to this the common hepatic duct measures 5 mm. Cystic duct remnant is not dilated. Common bile duct is normal in caliber measuring only 4 mm in the porta hepatis. No filling defects are noted in the common bile duct to suggest choledocholithiasis. Pancreas: No pancreatic mass. No pancreatic ductal dilatation. No pancreatic or peripancreatic fluid collections or inflammatory changes. Spleen:  Unremarkable. Adrenals/Urinary Tract: Subcentimeter T1 hypointense, T2 hyperintense, nonenhancing lesion in the lower pole of the left kidney, compatible with a tiny simple cyst (Bosniak class 1, no imaging follow-up recommended). Right kidney and bilateral adrenal glands are normal in appearance. No hydroureteronephrosis in the visualized portions of the abdomen. Stomach/Bowel: Visualized portions are unremarkable. Vascular/Lymphatic: No aneurysm identified in the visualized abdominal vasculature. No lymphadenopathy noted in the abdomen. Other: No significant volume of ascites noted in the visualized portions of the peritoneal cavity. Musculoskeletal: No aggressive appearing osseous lesions are noted in the visualized portions of the skeleton. IMPRESSION: 1. Unusual but increasingly aggressive and mass-like appearing lesion in segment 5 of the liver adjacent to the gallbladder fossa, concerning for potential neoplasm. Tissue sampling should be considered to establish a tissue diagnosis. Further evaluation with PET-CT should also be considered. 2. Mild intrahepatic biliary ductal dilatation, new compared to the preoperative study before laparoscopic cholecystectomy. Today's study demonstrates signal void in the region of the common hepatic duct. This could be artifactual and related to cholecystectomy clips, however, the possibility of  postoperative stricture is not excluded. Correlation with liver function tests is recommended. Lack of common bile duct dilatation is unusual in this post cholecystectomy patient, and could suggest narrowing of the common hepatic duct. ERCP could be considered for further evaluation if clinically appropriate. 3. Additional incidental findings, as above. These results will be called to the ordering clinician or representative by the Radiologist Assistant, and communication documented in the PACS or [employer]. Electronically Signed   By: Trudie Reed M.D.   On: 04/05/2023 10:20   CT ABDOMEN PELVIS WO CONTRAST  Result Date: 04/04/2023 CLINICAL DATA:  Jaundice, concern for malignancy. EXAM: CT ABDOMEN AND PELVIS WITHOUT CONTRAST TECHNIQUE: Multidetector CT imaging of the abdomen and pelvis was performed following the standard protocol without IV contrast. RADIATION DOSE REDUCTION: This exam was performed according to the departmental dose-optimization program which includes automated exposure control, adjustment of the mA and/or kV according to patient size and/or use of iterative reconstruction technique. COMPARISON:  MRI abdomen 08/30/2022.  CT abdomen 08/14/2022. FINDINGS: Lower chest: No acute abnormality. Hepatobiliary: There some ill-defined heterogeneous hypodensity in the region of the gallbladder fossa measuring 2.4 x 3.0 cm this appears similar to the prior study given lack of contrast. Gallbladder is surgically absent. There is no biliary ductal dilatation. Pancreas: Unremarkable. No pancreatic ductal dilatation or surrounding inflammatory changes. Spleen: Normal in size without focal abnormality. Adrenals/Urinary Tract: There is a 3 mm calculus in the right kidney. There is no hydronephrosis or perinephric fat stranding. Left kidney, ureters and bladder are normal. Stomach/Bowel: Stomach is within normal limits. No evidence of bowel wall thickening, distention, or inflammatory changes. The  appendix is not seen. There is a large amount of stool throughout colon. Vascular/Lymphatic: There severe atherosclerotic calcifications of  the aorta, iliac arteries and branch vessels. There are no enlarged lymph nodes identified. Vascular stents are seen in the femoral regions along with postsurgical change and scarring bilaterally. Reproductive: Uterus and bilateral adnexa are unremarkable. Other: No abdominal wall hernia or abnormality. No abdominopelvic ascites. Musculoskeletal: There are few scattered rounded lucent areas in the vertebral bodies particularly at L2. These are similar to prior. IMPRESSION: 1. Ill-defined hypodensity in the region of the gallbladder fossa appears similar to the prior study given lack of contrast. This would be better evaluated with dedicated MRI. 2. Nonobstructing right renal calculus. 3. Large amount of stool throughout the colon. 4. Stable indeterminate lucent areas in the vertebral bodies of the lumbar spine. 5. Severe atherosclerotic disease. Aortic Atherosclerosis (ICD10-I70.0). Electronically Signed   By: Darliss Cheney M.D.   On: 04/04/2023 23:19      Assessment/Plan Liver lesion Painless jaundice 82 y/o F w/ history of fenestrating cholecystectomy 10/12/22 (path: gallstone) . Prior to her cholecystectomy Her CT scan showed a hypodense abnormality in the liver adjacent to the gallbladder. MRI with [facility] (12.2023) showed significant decrease in size of this area, and appeared consistent with chronic inflammation and not malignancy. her MRI today shows a mass in segment 5 of her liver adjacent to the gallbladder fossa, along with mild intra-hepatic biliary ductal dilation. The patients MRI is concerning for possible malignancy. I reviewed the patients imaging with Dr. Freida Busman. I recommend GI consult for consideration of ERCP for decompression and tissue sampling if possible. Would recommend continuing to hold xarelto until GI evaluation.   I reviewed nursing notes,  hospitalist notes, last 24 h vitals and pain scores, last 48 h intake and output, last 24 h labs and trends, and last 24 h imaging results.  Adam Phenix, [facility] 04/05/2023, 2:55 PM Please see Amion for pager number during day hours 7:00am-4:30pm or 7:00am -11:30am on weekends

## 2023-04-05 NOTE — Progress Notes (Signed)
Transition of Care [facility]) - Inpatient Brief Assessment   Patient Details  Name: [name] MRN: [id number] Date of Birth: Jan 22, 1941  [facility] CM/SW Contact:    Janae Bridgeman, RN Phone Number: 04/05/2023, 4:17 PM   Clinical Narrative: CM met with the patient and daughter at the bedside to discuss TOC needs - no needs at this time.  Patient provided with Medicare Observation letter.  Patient was admitted for Jaundice and hopes to return home when she is medically stable to discharge.   Transition of Care Asessment: Insurance and Status: (P) Insurance coverage has been reviewed Patient has primary care physician: (P) Yes Home environment has been reviewed: (P) Yes Prior level of function:: (P) Independent Prior/Current Home Services: (P) No current home services Social Determinants of Health Reivew: (P) SDOH reviewed no interventions necessary Readmission risk has been reviewed: (P) Yes Transition of care needs: (P) no transition of care needs at this time

## 2023-04-05 NOTE — Inpatient Diabetes Management (Signed)
Inpatient Diabetes Program Recommendations  AACE/ADA: New Consensus Statement on Inpatient Glycemic Control (2015)  Target Ranges:  Prepandial:   less than 140 mg/dL      Peak postprandial:   less than 180 mg/dL (1-2 hours)      Critically ill patients:  140 - 180 mg/dL   Lab Results  Component Value Date   GLUCAP 136 (H) 04/05/2023   HGBA1C 7.9 (H) 03/11/2023    Latest Reference Range & Units 04/05/23 02:05  Sodium 135 - 145 mmol/L 138  Potassium 3.5 - 5.1 mmol/L 2.9 (L)  Chloride 98 - 111 mmol/L 110  CO2 22 - 32 mmol/L 17 (L)  Glucose 70 - 99 mg/dL 60 (L)  BUN 8 - 23 mg/dL 25 (H)  Creatinine 1.61 - 1.00 mg/dL 0.96 (H)  Calcium 8.9 - 10.3 mg/dL 8.4 (L)  Anion gap 5 - 15  11  Magnesium 1.7 - 2.4 mg/dL 1.4 (L)  Alkaline Phosphatase 38 - 126 U/L 550 (H)  Albumin 3.5 - 5.0 g/dL 2.5 (L)  Lipase 11 - 51 U/L 43  AST 15 - 41 U/L 163 (H)  ALT 0 - 44 U/L 115 (H)  Total Protein 6.5 - 8.1 g/dL 5.7 (L)  Total Bilirubin 0.3 - 1.2 mg/dL 04.5 (H)  GFR, Estimated >60 mL/min 29 (L)  (L): Data is abnormally low (H): Data is abnormally high  Review of Glycemic Control  Diabetes history: DM2 Outpatient Diabetes medications: 75/25 15 units q am, Prandin 2 mg bid, Jardiance 25 mg every day, Metformin 1 gm bid Current orders for Inpatient glycemic control: Novolog 0-6 units q 4 hrs.  Inpatient Diabetes Program Recommendations:   Noted patient saw Dr. Lucianne Muss on 03/14/23 and 75/25 insulin increased from 8 units to 15 units every day.  Noted GFR 29 so will limit restarting Metformin @ discharge. Will follow while hospitalized.  Thank you, Billy Fischer. Gaston Dase, RN, MSN, CDE  Diabetes Coordinator Inpatient Glycemic Control Team Team Pager [phone] (8am-5pm) 04/05/2023 10:12 AM

## 2023-04-06 DIAGNOSIS — R17 Unspecified jaundice: Secondary | ICD-10-CM | POA: Diagnosis not present

## 2023-04-06 LAB — IRON AND TIBC
Iron: 61 ug/dL (ref 28–170)
Saturation Ratios: 21 % (ref 10.4–31.8)
TIBC: 287 ug/dL (ref 250–450)
UIBC: 226 ug/dL

## 2023-04-06 LAB — GLUCOSE, CAPILLARY
Glucose-Capillary: 152 mg/dL — ABNORMAL HIGH (ref 70–99)
Glucose-Capillary: 154 mg/dL — ABNORMAL HIGH (ref 70–99)
Glucose-Capillary: 156 mg/dL — ABNORMAL HIGH (ref 70–99)
Glucose-Capillary: 213 mg/dL — ABNORMAL HIGH (ref 70–99)
Glucose-Capillary: 217 mg/dL — ABNORMAL HIGH (ref 70–99)

## 2023-04-06 LAB — VITAMIN B12: Vitamin B-12: 1567 pg/mL — ABNORMAL HIGH (ref 180–914)

## 2023-04-06 LAB — COMPREHENSIVE METABOLIC PANEL
ALT: 113 U/L — ABNORMAL HIGH (ref 0–44)
AST: 171 U/L — ABNORMAL HIGH (ref 15–41)
Albumin: 2.2 g/dL — ABNORMAL LOW (ref 3.5–5.0)
Alkaline Phosphatase: 587 U/L — ABNORMAL HIGH (ref 38–126)
Anion gap: 7 (ref 5–15)
BUN: 18 mg/dL (ref 8–23)
CO2: 19 mmol/L — ABNORMAL LOW (ref 22–32)
Calcium: 9 mg/dL (ref 8.9–10.3)
Chloride: 113 mmol/L — ABNORMAL HIGH (ref 98–111)
Creatinine, Ser: 1.73 mg/dL — ABNORMAL HIGH (ref 0.44–1.00)
GFR, Estimated: 29 mL/min — ABNORMAL LOW (ref >60)
Glucose, Bld: 144 mg/dL — ABNORMAL HIGH (ref 70–99)
Potassium: 4.7 mmol/L (ref 3.5–5.1)
Sodium: 139 mmol/L (ref 135–145)
Total Bilirubin: 13.1 mg/dL — ABNORMAL HIGH (ref 0.3–1.2)
Total Protein: 5.5 g/dL — ABNORMAL LOW (ref 6.5–8.1)

## 2023-04-06 LAB — CBC
HCT: 29.5 % — ABNORMAL LOW (ref 36.0–46.0)
Hemoglobin: 9.5 g/dL — ABNORMAL LOW (ref 12.0–15.0)
MCH: 28.3 pg (ref 26.0–34.0)
MCHC: 32.2 g/dL (ref 30.0–36.0)
MCV: 87.8 fL (ref 80.0–100.0)
Platelets: 147 10*3/uL — ABNORMAL LOW (ref 150–400)
RBC: 3.36 MIL/uL — ABNORMAL LOW (ref 3.87–5.11)
RDW: 18.6 % — ABNORMAL HIGH (ref 11.5–15.5)
WBC: 7.5 10*3/uL (ref 4.0–10.5)
nRBC: 0 % (ref 0.0–0.2)

## 2023-04-06 LAB — RETICULOCYTES
Immature Retic Fract: 11.3 % (ref 2.3–15.9)
RBC.: 3.3 MIL/uL — ABNORMAL LOW (ref 3.87–5.11)
Retic Count, Absolute: 39.6 10*3/uL (ref 19.0–186.0)
Retic Ct Pct: 1.2 % (ref 0.4–3.1)

## 2023-04-06 LAB — MAGNESIUM: Magnesium: 2.2 mg/dL (ref 1.7–2.4)

## 2023-04-06 LAB — FERRITIN: Ferritin: 199 ng/mL (ref 11–307)

## 2023-04-06 LAB — FOLATE: Folate: 7.9 ng/mL (ref >5.9)

## 2023-04-06 MED ORDER — HEPARIN SODIUM (PORCINE) 5000 UNIT/ML IJ SOLN
5000.0000 [IU] | Freq: Three times a day (TID) | INTRAMUSCULAR | Status: AC
  Administered 2023-04-06 (×2): 5000 [IU] via SUBCUTANEOUS
  Filled 2023-04-06 (×2): qty 1

## 2023-04-06 MED ORDER — SODIUM CHLORIDE 0.9 % IV SOLN: INTRAVENOUS | Status: DC

## 2023-04-06 NOTE — Progress Notes (Signed)
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

## 2023-04-06 NOTE — Plan of Care (Signed)

## 2023-04-06 NOTE — Progress Notes (Signed)
Subjective: No acute complaints this morning, denies abdominal pain. Tbili up to 13.   Objective: Vital signs in last 24 hours: Temp:  [97.6 F (36.4 C)-98.3 F (36.8 C)] 98.3 F (36.8 C) (07/13 0445) Pulse Rate:  [75-85] 84 (07/13 0445) Resp:  [16-18] 16 (07/13 0448) BP: (154-187)/(59-68) 154/59 (07/13 0445) SpO2:  [99 %-100 %] 99 % (07/13 0445) Weight:  [66.2 kg] 66.2 kg (07/13 0448) Last BM Date : 04/05/23  Intake/Output from previous day: 07/12 0701 - 07/13 0700 In: 1857.9 [P.O.:237; I.V.:1321; IV Piggyback:300] Out: -  Intake/Output this shift: Total I/O In: 286.7 [I.V.:286.7] Out: -   PE: General: resting comfortably, NAD Neuro: alert and oriented, no focal deficits HEENT: scleral icterus Resp: normal work of breathing on room air Abdomen: soft, nondistended Skin: jaundiced   Lab Results:  Recent Labs    04/05/23 0205 04/06/23 0613  WBC 9.9 7.5  HGB 9.6* 9.5*  HCT 28.7* 29.5*  PLT 171 147*   BMET Recent Labs    04/05/23 1430 04/06/23 0613  NA 141 139  K 4.6 4.7  CL 114* 113*  CO2 19* 19*  GLUCOSE 86 144*  BUN 20 18  CREATININE 1.62* 1.73*  CALCIUM 9.2 9.0   PT/INR Recent Labs    04/05/23 0205  LABPROT 20.6*  INR 1.7*   CMP     Component Value Date/Time   NA 139 04/06/2023 0613   NA 142 07/29/2020 0942   K 4.7 04/06/2023 0613   CL 113 (H) 04/06/2023 0613   CO2 19 (L) 04/06/2023 0613   GLUCOSE 144 (H) 04/06/2023 0613   BUN 18 04/06/2023 0613   BUN 17 07/29/2020 0942   CREATININE 1.73 (H) 04/06/2023 0613   CREATININE 0.96 10/12/2021 1644   CALCIUM 9.0 04/06/2023 0613   PROT 5.5 (L) 04/06/2023 0613   PROT 6.3 07/28/2020 0945   ALBUMIN 2.2 (L) 04/06/2023 0613   ALBUMIN 4.2 07/28/2020 0945   AST 171 (H) 04/06/2023 0613   ALT 113 (H) 04/06/2023 0613   ALKPHOS 587 (H) 04/06/2023 0613   BILITOT 13.1 (H) 04/06/2023 0613   BILITOT 0.2 07/28/2020 0945   GFRNONAA 29 (L) 04/06/2023 0613   GFRAA 75 07/29/2020 0942   Lipase      Component Value Date/Time   LIPASE 43 04/05/2023 0205       Studies/Results: MR ABDOMEN MRCP W WO CONTAST  Result Date: 04/05/2023 CLINICAL DATA:  82 year old female with history of jaundice. Status post laparoscopic cholecystectomy 10/12/2022. EXAM: MRI ABDOMEN WITHOUT AND WITH CONTRAST (INCLUDING MRCP) TECHNIQUE: Multiplanar multisequence MR imaging of the abdomen was performed both before and after the administration of intravenous contrast. Heavily T2-weighted images of the biliary and pancreatic ducts were obtained, and three-dimensional MRCP images were rendered by post processing. CONTRAST:  6mL GADAVIST GADOBUTROL 1 MMOL/ML IV SOLN COMPARISON:  Abdominal MRI 08/30/2022. CT of the abdomen and pelvis 04/04/2023. FINDINGS: Lower chest: Susceptibility artifact in the lower sternum from indwelling median sternotomy wires. Hepatobiliary: Patient is status post cholecystectomy. When compared to the prior examination, there is increasingly prominent mass-like architectural distortion in the liver adjacent to the gallbladder fossa centered predominantly in segment 5. This areas heterogeneous in signal intensity on T1 and T2 weighted images, but generally centrally T1 hypointense with heterogeneous T2 hyperintensity (including central areas that appear cystic). There is extensive hyperenhancement during arterial phase imaging in the surrounding hepatic parenchyma, but the central nidus of the lesion is estimated to measure approximately 3.5  x 2.5 x 2.0 cm on axial image 78 of series 1001 and coronal image 83 of series 12, where the lesion appears centrally hypovascular with a peripheral rim of enhancement. The peripheral hypervascularity appears to resolve rapidly on more delayed post gadolinium imaging (although subtraction imaging demonstrates persistent peripheral enhancement), and the lesion appears hypovascular centrally on delayed imaging. Diffusion imaging demonstrates heterogeneous areas of  diffusion restriction associated with this lesion. MRCP images demonstrates some mild intrahepatic biliary ductal dilatation. There is signal void in the region of the common hepatic duct (coronal MRCP image 68 of series 6), which could be related to artifact from cholecystectomy clips. Immediately distal to this the common hepatic duct measures 5 mm. Cystic duct remnant is not dilated. Common bile duct is normal in caliber measuring only 4 mm in the porta hepatis. No filling defects are noted in the common bile duct to suggest choledocholithiasis. Pancreas: No pancreatic mass. No pancreatic ductal dilatation. No pancreatic or peripancreatic fluid collections or inflammatory changes. Spleen:  Unremarkable. Adrenals/Urinary Tract: Subcentimeter T1 hypointense, T2 hyperintense, nonenhancing lesion in the lower pole of the left kidney, compatible with a tiny simple cyst (Bosniak class 1, no imaging follow-up recommended). Right kidney and bilateral adrenal glands are normal in appearance. No hydroureteronephrosis in the visualized portions of the abdomen. Stomach/Bowel: Visualized portions are unremarkable. Vascular/Lymphatic: No aneurysm identified in the visualized abdominal vasculature. No lymphadenopathy noted in the abdomen. Other: No significant volume of ascites noted in the visualized portions of the peritoneal cavity. Musculoskeletal: No aggressive appearing osseous lesions are noted in the visualized portions of the skeleton. IMPRESSION: 1. Unusual but increasingly aggressive and mass-like appearing lesion in segment 5 of the liver adjacent to the gallbladder fossa, concerning for potential neoplasm. Tissue sampling should be considered to establish a tissue diagnosis. Further evaluation with PET-CT should also be considered. 2. Mild intrahepatic biliary ductal dilatation, new compared to the preoperative study before laparoscopic cholecystectomy. Today's study demonstrates signal void in the region of the  common hepatic duct. This could be artifactual and related to cholecystectomy clips, however, the possibility of postoperative stricture is not excluded. Correlation with liver function tests is recommended. Lack of common bile duct dilatation is unusual in this post cholecystectomy patient, and could suggest narrowing of the common hepatic duct. ERCP could be considered for further evaluation if clinically appropriate. 3. Additional incidental findings, as above. These results will be called to the ordering clinician or representative by the Radiologist Assistant, and communication documented in the PACS or [employer]. Electronically Signed   By: Trudie Reed M.D.   On: 04/05/2023 10:20   MR 3D Recon At Scanner  Result Date: 04/05/2023 CLINICAL DATA:  82 year old female with history of jaundice. Status post laparoscopic cholecystectomy 10/12/2022. EXAM: MRI ABDOMEN WITHOUT AND WITH CONTRAST (INCLUDING MRCP) TECHNIQUE: Multiplanar multisequence MR imaging of the abdomen was performed both before and after the administration of intravenous contrast. Heavily T2-weighted images of the biliary and pancreatic ducts were obtained, and three-dimensional MRCP images were rendered by post processing. CONTRAST:  6mL GADAVIST GADOBUTROL 1 MMOL/ML IV SOLN COMPARISON:  Abdominal MRI 08/30/2022. CT of the abdomen and pelvis 04/04/2023. FINDINGS: Lower chest: Susceptibility artifact in the lower sternum from indwelling median sternotomy wires. Hepatobiliary: Patient is status post cholecystectomy. When compared to the prior examination, there is increasingly prominent mass-like architectural distortion in the liver adjacent to the gallbladder fossa centered predominantly in segment 5. This areas heterogeneous in signal intensity on T1 and T2 weighted images, but  generally centrally T1 hypointense with heterogeneous T2 hyperintensity (including central areas that appear cystic). There is extensive hyperenhancement  during arterial phase imaging in the surrounding hepatic parenchyma, but the central nidus of the lesion is estimated to measure approximately 3.5 x 2.5 x 2.0 cm on axial image 78 of series 1001 and coronal image 83 of series 12, where the lesion appears centrally hypovascular with a peripheral rim of enhancement. The peripheral hypervascularity appears to resolve rapidly on more delayed post gadolinium imaging (although subtraction imaging demonstrates persistent peripheral enhancement), and the lesion appears hypovascular centrally on delayed imaging. Diffusion imaging demonstrates heterogeneous areas of diffusion restriction associated with this lesion. MRCP images demonstrates some mild intrahepatic biliary ductal dilatation. There is signal void in the region of the common hepatic duct (coronal MRCP image 68 of series 6), which could be related to artifact from cholecystectomy clips. Immediately distal to this the common hepatic duct measures 5 mm. Cystic duct remnant is not dilated. Common bile duct is normal in caliber measuring only 4 mm in the porta hepatis. No filling defects are noted in the common bile duct to suggest choledocholithiasis. Pancreas: No pancreatic mass. No pancreatic ductal dilatation. No pancreatic or peripancreatic fluid collections or inflammatory changes. Spleen:  Unremarkable. Adrenals/Urinary Tract: Subcentimeter T1 hypointense, T2 hyperintense, nonenhancing lesion in the lower pole of the left kidney, compatible with a tiny simple cyst (Bosniak class 1, no imaging follow-up recommended). Right kidney and bilateral adrenal glands are normal in appearance. No hydroureteronephrosis in the visualized portions of the abdomen. Stomach/Bowel: Visualized portions are unremarkable. Vascular/Lymphatic: No aneurysm identified in the visualized abdominal vasculature. No lymphadenopathy noted in the abdomen. Other: No significant volume of ascites noted in the visualized portions of the  peritoneal cavity. Musculoskeletal: No aggressive appearing osseous lesions are noted in the visualized portions of the skeleton. IMPRESSION: 1. Unusual but increasingly aggressive and mass-like appearing lesion in segment 5 of the liver adjacent to the gallbladder fossa, concerning for potential neoplasm. Tissue sampling should be considered to establish a tissue diagnosis. Further evaluation with PET-CT should also be considered. 2. Mild intrahepatic biliary ductal dilatation, new compared to the preoperative study before laparoscopic cholecystectomy. Today's study demonstrates signal void in the region of the common hepatic duct. This could be artifactual and related to cholecystectomy clips, however, the possibility of postoperative stricture is not excluded. Correlation with liver function tests is recommended. Lack of common bile duct dilatation is unusual in this post cholecystectomy patient, and could suggest narrowing of the common hepatic duct. ERCP could be considered for further evaluation if clinically appropriate. 3. Additional incidental findings, as above. These results will be called to the ordering clinician or representative by the Radiologist Assistant, and communication documented in the PACS or [employer]. Electronically Signed   By: Trudie Reed M.D.   On: 04/05/2023 10:20   CT ABDOMEN PELVIS WO CONTRAST  Result Date: 04/04/2023 CLINICAL DATA:  Jaundice, concern for malignancy. EXAM: CT ABDOMEN AND PELVIS WITHOUT CONTRAST TECHNIQUE: Multidetector CT imaging of the abdomen and pelvis was performed following the standard protocol without IV contrast. RADIATION DOSE REDUCTION: This exam was performed according to the departmental dose-optimization program which includes automated exposure control, adjustment of the mA and/or kV according to patient size and/or use of iterative reconstruction technique. COMPARISON:  MRI abdomen 08/30/2022.  CT abdomen 08/14/2022. FINDINGS: Lower  chest: No acute abnormality. Hepatobiliary: There some ill-defined heterogeneous hypodensity in the region of the gallbladder fossa measuring 2.4 x 3.0 cm this appears  similar to the prior study given lack of contrast. Gallbladder is surgically absent. There is no biliary ductal dilatation. Pancreas: Unremarkable. No pancreatic ductal dilatation or surrounding inflammatory changes. Spleen: Normal in size without focal abnormality. Adrenals/Urinary Tract: There is a 3 mm calculus in the right kidney. There is no hydronephrosis or perinephric fat stranding. Left kidney, ureters and bladder are normal. Stomach/Bowel: Stomach is within normal limits. No evidence of bowel wall thickening, distention, or inflammatory changes. The appendix is not seen. There is a large amount of stool throughout colon. Vascular/Lymphatic: There severe atherosclerotic calcifications of the aorta, iliac arteries and branch vessels. There are no enlarged lymph nodes identified. Vascular stents are seen in the femoral regions along with postsurgical change and scarring bilaterally. Reproductive: Uterus and bilateral adnexa are unremarkable. Other: No abdominal wall hernia or abnormality. No abdominopelvic ascites. Musculoskeletal: There are few scattered rounded lucent areas in the vertebral bodies particularly at L2. These are similar to prior. IMPRESSION: 1. Ill-defined hypodensity in the region of the gallbladder fossa appears similar to the prior study given lack of contrast. This would be better evaluated with dedicated MRI. 2. Nonobstructing right renal calculus. 3. Large amount of stool throughout the colon. 4. Stable indeterminate lucent areas in the vertebral bodies of the lumbar spine. 5. Severe atherosclerotic disease. Aortic Atherosclerosis (ICD10-I70.0). Electronically Signed   By: Darliss Cheney M.D.   On: 04/04/2023 23:19      Assessment/Plan 82 yo female approximately 6 months s/p partial fenestrating cholecystectomy, now  with liver mass and jaundice. On review of her MRCP, there appears to be signal loss at the common hepatic duct at the bifurcation, which appears separate from the solid/cystic mass. This was interpreted as possible artifact from surgical clips on MRI, however no clips were placed during surgery as it was a partial cholecystectomy. Only the dome of the gallbladder was opened, so a stricture from a vascular injury is also extremely unlikely. Clinical presentation is highly concerning for a malignancy. Patient will need a tissue diagnosis and ultimately biliary decompression given worsening jaundice, via either ERCP or PTBD. Agree with plans for a liver biopsy as well. I reviewed this with the patient at bedside this morning.   LOS: 2 days    Sophronia Simas, MD [facility] General, Hepatobiliary and Pancreatic Surgery 04/06/23 7:56 AM

## 2023-04-06 NOTE — Progress Notes (Signed)
PROGRESS NOTE [name]  JXB:[id number] DOB: 04-15-1941 DOA: 04/04/2023 PCP: Shon Hale, MD  Brief Narrative/Hospital Course: 82 y.o.f w/ hypertension,IDDM,CAD,PAD,and cholecystectomy in January of this year presented to the ED with jaundice. Patient reports approximately 1 week of fatigue, loss of appetite, and postprandial bloating AND noticed to have jaundice so came to the ED.She reports history of chronic diarrhea which is unchanged. In ZH:[id number] and saturating well on room air with normal heart rate and elevated blood pressure. Labs are most notable for creatinine 1.87, alkaline phosphatase 646, AST 185, ALT 137, and total bilirubin 13.1. CT  abd > demonstrates ill-defined hypodensity in the region of the gallbladder fossa which appears similar to prior study. Patient was given 1 L of normal saline in the ED and admitted for further management. 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 Freida Busman 10/12/2022. MRCP 7/12>>"1. Unusual but increasingly aggressive and mass-like appearing lesion in segment 5 of the liver adjacent to the gallbladder fossa, concerning for potential neoplasm" advised to see sampling/PET/CT, also there is possibility of postoperative stricture, ERCP could be considered. GI and general surgery was consulted.     Subjective: Patient seen and examined this morning  2 daughters at the bedside  She has no complaints no abdomen pain nausea vomiting, has diarrhea but not today  Appears icteric    Assessment and Plan: Principal Problem:   Jaundice Active Problems:   DM2 (diabetes mellitus, type 2) (HCC)   Carotid artery disease (HCC)    PAD (peripheral artery disease) (HCC)   Acute renal failure superimposed on stage 3b chronic kidney disease (HCC)   Coronary artery disease involving native heart without angina pectoris   Abnormal LFTs/transaminitis Previous partial fenestrating cholecystectomy by Dr. Freida Busman 10/12/2022: Underwent CT abdomen as well as MRCP-concerning for liver mass possible biliary stricture possible clips from surgery-See report for detail.  GI and surgeon has seen the patient- per surgery Dr Allen>there was signal loss in the common bile duct at the bifurcation which appears separate from the solid/cystic mass, interpreted as possible artifact from surgical clips on MRI however per Dr Freida Busman no clips with placed during surgery as it was a partial cholecystectomy, only the dome of the gallbladder was opened so stricture from vascular injury is also extremely unlikely and finding concerning for malignancy. IR planning for liver biopsy 7/15 and advised to hold Sunday and Monday.  GI following for possible ERCP 7/14- holding xarelto LFTs remain elevated continue to trend as below. Recent Labs  Lab 04/04/23 2138 04/05/23 0205 04/06/23 0613  AST 185* 163* 171*  ALT 137* 115* 113*  ALKPHOS 646* 550* 587*  BILITOT 13.1* 11.1* 13.1*  PROT 7.1 5.7* 5.5*  ALBUMIN 3.0* 2.5* 2.2*  INR  --  1.7*  --     AKI on CKD 3b Metabolic acidosis bicarb 18-17:  Baseline around 1.2 last month.No hydronephrosis on CT in ED. continue on IV fluids, continue to hold hctz, ARB, Jardiance, and metformin.Avoid nephrotoxic medication and repeat labs.  Recent Labs    06/28/22 1705 08/28/22 0912 10/08/22 1327 10/13/22 0825 11/09/22 9629 11/16/22 0830 12/07/22 0845 03/11/23 0823 04/04/23 2138 04/05/23 0205 04/05/23 1430 04/06/23 0613  BUN 24* 15 23 27* 30* 22 23 25* 27* 25* 20 18  CREATININE 1.06* 0.86 1.09* 1.33* 1.00 1.10* 1.03 1.21* 1.87* 1.75* 1.62* 1.73*  CO2 21* 28 24 25   --   --  29 27 18* 17* 19* 19*     Hypokalemia Hypomagnesemia: improved with aggressive replacement  Recent Labs  Lab 04/04/23 2138 04/05/23 0205 04/05/23 1430 04/06/23 0613  K 3.2* 2.9* 4.6 4.7  CALCIUM 9.1 8.4* 9.2 9.0  MG  --  1.4*  --  2.2    CAD:  No chest pain  PAD:  No acute ischemia, cont to hold Xarelto given need for ERCP and biopsy. hold Lipitor for now.Patient reports she had her Xarelto held for cholecystectomy previously without issue no history of DVT or PE.   Hypertension:  BP fairly controlled, cont to hold hydrochlorothiazide and ARB as above, continue metoprolol  Type II DM with hyperglycemia:  hba1c was 7.9% in June 2024, blood sugar stable continue ssi Recent Labs  Lab 04/05/23 1544 04/05/23 2020 04/06/23 0000 04/06/23 0437 04/06/23 0802  GLUCAP 73 190* 213* 156* 152*     Normocytic anemia: hemoglobin slightly downtrending, checked anemia panel-iron folate stable B12 pending Recent Labs  Lab 04/04/23 2138 04/05/23 0205 04/06/23 0613  HGB 11.3* 9.6* 9.5*  HCT 34.9* 28.7* 29.5*    DVT prophylaxis: heparin injection 5,000 Units Start: 04/06/23 1400 SCDs Start: 04/05/23 0030.SQ Heparin until 7/13 night Code Status:   Code Status: Full Code Family Communication: plan of care discussed with patient and daughters at bedside. Patient status is: Inpatient because of abnormal LFTs Level of care: Med-Surg   Dispo: The patient is from: home            Anticipated disposition: TBD Objective: Vitals last 24 hrs: Vitals:   04/06/23 0059 04/06/23 0445 04/06/23 0448 04/06/23 0829  BP:  (!) 154/59  (!) 164/69  Pulse: 75 84  93  Resp:  16 16 18   Temp: 97.6 F (36.4 C) 98.3 F (36.8 C)  97.8 F (36.6 C)  TempSrc: Oral Oral    SpO2: 100% 99%  100%  Weight:   66.2 kg   Height:       Weight change: 0.544 kg  Physical Examination: General exam: alert awake, oriented x3 HEENT:Oral mucosa moist, Ear/Nose WNL grossly Respiratory system: Bilaterally clear BS,no use of accessory  muscle Cardiovascular system: S1 & S2 +, No JVD. Gastrointestinal system: Abdomen soft,NT,ND, BS+ Nervous System: Alert, awake, moving all extremities,and following commands. Extremities: LE edema neg,distal peripheral pulses palpable and warm.  Skin: No rashes, +++ generalized icterus. MSK: Normal muscle bulk,tone, power   Medications reviewed:  Scheduled Meds:  brimonidine  1 drop Right Eye BID   heparin  5,000 Units Subcutaneous Q8H   insulin aspart  0-6 Units Subcutaneous Q4H   metoprolol tartrate  37.5 mg Oral BID   pantoprazole  40 mg Oral Daily   Continuous Infusions:  dextrose 5 % and 0.9 % NaCl 100 mL/hr at 04/06/23 0740    Diet Order             Diet NPO time specified Except for: Sips with Meds  Diet effective midnight           Diet regular Room service appropriate? Yes; Fluid consistency: Thin  Diet effective now                  Intake/Output Summary (Last 24 hours) at 04/06/2023 1110 Last data filed at 04/06/2023 0740 Gross per 24 hour  Intake 2144.58 ml  Output --  Net  2144.58 ml   Net IO Since Admission: 2,451.25 mL [04/06/23 1110]  Wt Readings from Last 3 Encounters:  04/06/23 66.2 kg  03/14/23 65.8 kg  12/25/22 64.9 kg     Unresulted Labs (From admission, onward)     Start     Ordered   04/08/23 0500  Protime-INR  ONCE - STAT,   R        04/05/23 1605   04/05/23 0500  Comprehensive metabolic panel  Daily,   R      04/05/23 0029   04/05/23 0500  CBC  Daily,   R      04/05/23 0029          Data Reviewed: I have personally reviewed following labs and imaging studies CBC: Recent Labs  Lab 04/04/23 2138 04/05/23 0205 04/06/23 0613  WBC 11.0* 9.9 7.5  NEUTROABS 7.8*  --   --   HGB 11.3* 9.6* 9.5*  HCT 34.9* 28.7* 29.5*  MCV 86.6 85.4 87.8  PLT 210 171 147*   Basic Metabolic Panel: Recent Labs  Lab 04/04/23 2138 04/05/23 0205 04/05/23 1430 04/06/23 0613  NA 137 138 141 139  K 3.2* 2.9* 4.6 4.7  CL 104 110 114* 113*  CO2 18*  17* 19* 19*  GLUCOSE 159* 60* 86 144*  BUN 27* 25* 20 18  CREATININE 1.87* 1.75* 1.62* 1.73*  CALCIUM 9.1 8.4* 9.2 9.0  MG  --  1.4*  --  2.2   GFR: Estimated Creatinine Clearance: 22.9 mL/min (A) (by C-G formula based on SCr of 1.73 mg/dL (H)). Liver Function Tests: Recent Labs  Lab 04/04/23 2138 04/05/23 0205 04/06/23 0613  AST 185* 163* 171*  ALT 137* 115* 113*  ALKPHOS 646* 550* 587*  BILITOT 13.1* 11.1* 13.1*  PROT 7.1 5.7* 5.5*  ALBUMIN 3.0* 2.5* 2.2*   Recent Labs  Lab 04/05/23 0205  LIPASE 43   No results for input(s): "AMMONIA" in the last 168 hours. Coagulation Profile: Recent Labs  Lab 04/05/23 0205  INR 1.7*  CBG: Recent Labs  Lab 04/05/23 1544 04/05/23 2020 04/06/23 0000 04/06/23 0437 04/06/23 0802  GLUCAP 73 190* 213* 156* 152*  No results found for this or any previous visit (from the past 240 hour(s)).  Antimicrobials: Anti-infectives (From admission, onward)    None      Culture/Microbiology    Component Value Date/Time   SDES  09/20/2021 1448    WOUND RIGHT FOOT Performed at [facility], [address], [place], [place] [address]    SPECREQUEST  09/20/2021 1448    NONE Performed at [facility], [address], [place], [place] [address]    CULT  09/20/2021 1448    MODERATE CORYNEBACTERIUM STRIATUM Standardized susceptibility testing for this organism is not available. NO ANAEROBES ISOLATED Performed at [facility], [address], [place], [place] [address]    REPTSTATUS 09/26/2021 FINAL 09/20/2021 1448  Radiology Studies: MR ABDOMEN MRCP W WO CONTAST  Result Date: 04/05/2023 CLINICAL DATA:  82 year old female with history of jaundice. Status post laparoscopic cholecystectomy 10/12/2022. EXAM: MRI ABDOMEN WITHOUT AND WITH CONTRAST (INCLUDING MRCP) TECHNIQUE: Multiplanar multisequence MR imaging of the abdomen was performed both before and after the administration of intravenous  contrast. Heavily T2-weighted images of the biliary and pancreatic ducts were obtained, and three-dimensional MRCP images were rendered by post processing. CONTRAST:  6mL GADAVIST GADOBUTROL 1 MMOL/ML IV SOLN COMPARISON:  Abdominal MRI 08/30/2022. CT of the abdomen and pelvis 04/04/2023. FINDINGS:  Lower chest: Susceptibility artifact in the lower sternum from indwelling median sternotomy wires. Hepatobiliary: Patient is status post cholecystectomy. When compared to the prior examination, there is increasingly prominent mass-like architectural distortion in the liver adjacent to the gallbladder fossa centered predominantly in segment 5. This areas heterogeneous in signal intensity on T1 and T2 weighted images, but generally centrally T1 hypointense with heterogeneous T2 hyperintensity (including central areas that appear cystic). There is extensive hyperenhancement during arterial phase imaging in the surrounding hepatic parenchyma, but the central nidus of the lesion is estimated to measure approximately 3.5 x 2.5 x 2.0 cm on axial image 78 of series 1001 and coronal image 83 of series 12, where the lesion appears centrally hypovascular with a peripheral rim of enhancement. The peripheral hypervascularity appears to resolve rapidly on more delayed post gadolinium imaging (although subtraction imaging demonstrates persistent peripheral enhancement), and the lesion appears hypovascular centrally on delayed imaging. Diffusion imaging demonstrates heterogeneous areas of diffusion restriction associated with this lesion. MRCP images demonstrates some mild intrahepatic biliary ductal dilatation. There is signal void in the region of the common hepatic duct (coronal MRCP image 68 of series 6), which could be related to artifact from cholecystectomy clips. Immediately distal to this the common hepatic duct measures 5 mm. Cystic duct remnant is not dilated. Common bile duct is normal in caliber measuring only 4 mm in the  porta hepatis. No filling defects are noted in the common bile duct to suggest choledocholithiasis. Pancreas: No pancreatic mass. No pancreatic ductal dilatation. No pancreatic or peripancreatic fluid collections or inflammatory changes. Spleen:  Unremarkable. Adrenals/Urinary Tract: Subcentimeter T1 hypointense, T2 hyperintense, nonenhancing lesion in the lower pole of the left kidney, compatible with a tiny simple cyst (Bosniak class 1, no imaging follow-up recommended). Right kidney and bilateral adrenal glands are normal in appearance. No hydroureteronephrosis in the visualized portions of the abdomen. Stomach/Bowel: Visualized portions are unremarkable. Vascular/Lymphatic: No aneurysm identified in the visualized abdominal vasculature. No lymphadenopathy noted in the abdomen. Other: No significant volume of ascites noted in the visualized portions of the peritoneal cavity. Musculoskeletal: No aggressive appearing osseous lesions are noted in the visualized portions of the skeleton. IMPRESSION: 1. Unusual but increasingly aggressive and mass-like appearing lesion in segment 5 of the liver adjacent to the gallbladder fossa, concerning for potential neoplasm. Tissue sampling should be considered to establish a tissue diagnosis. Further evaluation with PET-CT should also be considered. 2. Mild intrahepatic biliary ductal dilatation, new compared to the preoperative study before laparoscopic cholecystectomy. Today's study demonstrates signal void in the region of the common hepatic duct. This could be artifactual and related to cholecystectomy clips, however, the possibility of postoperative stricture is not excluded. Correlation with liver function tests is recommended. Lack of common bile duct dilatation is unusual in this post cholecystectomy patient, and could suggest narrowing of the common hepatic duct. ERCP could be considered for further evaluation if clinically appropriate. 3. Additional incidental  findings, as above. These results will be called to the ordering clinician or representative by the Radiologist Assistant, and communication documented in the PACS or [employer]. Electronically Signed   By: Trudie Reed M.D.   On: 04/05/2023 10:20   MR 3D Recon At Scanner  Result Date: 04/05/2023 CLINICAL DATA:  83 year old female with history of jaundice. Status post laparoscopic cholecystectomy 10/12/2022. EXAM: MRI ABDOMEN WITHOUT AND WITH CONTRAST (INCLUDING MRCP) TECHNIQUE: Multiplanar multisequence MR imaging of the abdomen was performed both before and after the administration of intravenous contrast. Heavily T2-weighted  images of the biliary and pancreatic ducts were obtained, and three-dimensional MRCP images were rendered by post processing. CONTRAST:  6mL GADAVIST GADOBUTROL 1 MMOL/ML IV SOLN COMPARISON:  Abdominal MRI 08/30/2022. CT of the abdomen and pelvis 04/04/2023. FINDINGS: Lower chest: Susceptibility artifact in the lower sternum from indwelling median sternotomy wires. Hepatobiliary: Patient is status post cholecystectomy. When compared to the prior examination, there is increasingly prominent mass-like architectural distortion in the liver adjacent to the gallbladder fossa centered predominantly in segment 5. This areas heterogeneous in signal intensity on T1 and T2 weighted images, but generally centrally T1 hypointense with heterogeneous T2 hyperintensity (including central areas that appear cystic). There is extensive hyperenhancement during arterial phase imaging in the surrounding hepatic parenchyma, but the central nidus of the lesion is estimated to measure approximately 3.5 x 2.5 x 2.0 cm on axial image 78 of series 1001 and coronal image 83 of series 12, where the lesion appears centrally hypovascular with a peripheral rim of enhancement. The peripheral hypervascularity appears to resolve rapidly on more delayed post gadolinium imaging (although subtraction imaging  demonstrates persistent peripheral enhancement), and the lesion appears hypovascular centrally on delayed imaging. Diffusion imaging demonstrates heterogeneous areas of diffusion restriction associated with this lesion. MRCP images demonstrates some mild intrahepatic biliary ductal dilatation. There is signal void in the region of the common hepatic duct (coronal MRCP image 68 of series 6), which could be related to artifact from cholecystectomy clips. Immediately distal to this the common hepatic duct measures 5 mm. Cystic duct remnant is not dilated. Common bile duct is normal in caliber measuring only 4 mm in the porta hepatis. No filling defects are noted in the common bile duct to suggest choledocholithiasis. Pancreas: No pancreatic mass. No pancreatic ductal dilatation. No pancreatic or peripancreatic fluid collections or inflammatory changes. Spleen:  Unremarkable. Adrenals/Urinary Tract: Subcentimeter T1 hypointense, T2 hyperintense, nonenhancing lesion in the lower pole of the left kidney, compatible with a tiny simple cyst (Bosniak class 1, no imaging follow-up recommended). Right kidney and bilateral adrenal glands are normal in appearance. No hydroureteronephrosis in the visualized portions of the abdomen. Stomach/Bowel: Visualized portions are unremarkable. Vascular/Lymphatic: No aneurysm identified in the visualized abdominal vasculature. No lymphadenopathy noted in the abdomen. Other: No significant volume of ascites noted in the visualized portions of the peritoneal cavity. Musculoskeletal: No aggressive appearing osseous lesions are noted in the visualized portions of the skeleton. IMPRESSION: 1. Unusual but increasingly aggressive and mass-like appearing lesion in segment 5 of the liver adjacent to the gallbladder fossa, concerning for potential neoplasm. Tissue sampling should be considered to establish a tissue diagnosis. Further evaluation with PET-CT should also be considered. 2. Mild  intrahepatic biliary ductal dilatation, new compared to the preoperative study before laparoscopic cholecystectomy. Today's study demonstrates signal void in the region of the common hepatic duct. This could be artifactual and related to cholecystectomy clips, however, the possibility of postoperative stricture is not excluded. Correlation with liver function tests is recommended. Lack of common bile duct dilatation is unusual in this post cholecystectomy patient, and could suggest narrowing of the common hepatic duct. ERCP could be considered for further evaluation if clinically appropriate. 3. Additional incidental findings, as above. These results will be called to the ordering clinician or representative by the Radiologist Assistant, and communication documented in the PACS or [employer]. Electronically Signed   By: Trudie Reed M.D.   On: 04/05/2023 10:20   CT ABDOMEN PELVIS WO CONTRAST  Result Date: 04/04/2023 CLINICAL DATA:  Jaundice,  concern for malignancy. EXAM: CT ABDOMEN AND PELVIS WITHOUT CONTRAST TECHNIQUE: Multidetector CT imaging of the abdomen and pelvis was performed following the standard protocol without IV contrast. RADIATION DOSE REDUCTION: This exam was performed according to the departmental dose-optimization program which includes automated exposure control, adjustment of the mA and/or kV according to patient size and/or use of iterative reconstruction technique. COMPARISON:  MRI abdomen 08/30/2022.  CT abdomen 08/14/2022. FINDINGS: Lower chest: No acute abnormality. Hepatobiliary: There some ill-defined heterogeneous hypodensity in the region of the gallbladder fossa measuring 2.4 x 3.0 cm this appears similar to the prior study given lack of contrast. Gallbladder is surgically absent. There is no biliary ductal dilatation. Pancreas: Unremarkable. No pancreatic ductal dilatation or surrounding inflammatory changes. Spleen: Normal in size without focal abnormality.  Adrenals/Urinary Tract: There is a 3 mm calculus in the right kidney. There is no hydronephrosis or perinephric fat stranding. Left kidney, ureters and bladder are normal. Stomach/Bowel: Stomach is within normal limits. No evidence of bowel wall thickening, distention, or inflammatory changes. The appendix is not seen. There is a large amount of stool throughout colon. Vascular/Lymphatic: There severe atherosclerotic calcifications of the aorta, iliac arteries and branch vessels. There are no enlarged lymph nodes identified. Vascular stents are seen in the femoral regions along with postsurgical change and scarring bilaterally. Reproductive: Uterus and bilateral adnexa are unremarkable. Other: No abdominal wall hernia or abnormality. No abdominopelvic ascites. Musculoskeletal: There are few scattered rounded lucent areas in the vertebral bodies particularly at L2. These are similar to prior. IMPRESSION: 1. Ill-defined hypodensity in the region of the gallbladder fossa appears similar to the prior study given lack of contrast. This would be better evaluated with dedicated MRI. 2. Nonobstructing right renal calculus. 3. Large amount of stool throughout the colon. 4. Stable indeterminate lucent areas in the vertebral bodies of the lumbar spine. 5. Severe atherosclerotic disease. Aortic Atherosclerosis (ICD10-I70.0). Electronically Signed   By: Darliss Cheney M.D.   On: 04/04/2023 23:19     LOS: 2 days   Lanae Boast, MD [facility]  04/06/2023, 11:10 AM

## 2023-04-07 ENCOUNTER — Inpatient Hospital Stay (HOSPITAL_COMMUNITY): Payer: Medicare Other | Admitting: Anesthesiology

## 2023-04-07 ENCOUNTER — Encounter (HOSPITAL_COMMUNITY): Payer: Self-pay | Admitting: Family Medicine

## 2023-04-07 ENCOUNTER — Encounter (HOSPITAL_COMMUNITY)
Admission: EM | Disposition: A | Discharge status: Home / Self Care | Payer: Self-pay | Source: Home / Self Care | Attending: Internal Medicine

## 2023-04-07 ENCOUNTER — Inpatient Hospital Stay (HOSPITAL_COMMUNITY): Payer: Medicare Other

## 2023-04-07 DIAGNOSIS — K8020 Calculus of gallbladder without cholecystitis without obstruction: Secondary | ICD-10-CM

## 2023-04-07 DIAGNOSIS — R17 Unspecified jaundice: Secondary | ICD-10-CM | POA: Diagnosis not present

## 2023-04-07 DIAGNOSIS — I129 Hypertensive chronic kidney disease with stage 1 through stage 4 chronic kidney disease, or unspecified chronic kidney disease: Secondary | ICD-10-CM

## 2023-04-07 DIAGNOSIS — Z87891 Personal history of nicotine dependence: Secondary | ICD-10-CM

## 2023-04-07 DIAGNOSIS — N1832 Chronic kidney disease, stage 3b: Secondary | ICD-10-CM

## 2023-04-07 HISTORY — PX: BILIARY STENT PLACEMENT: SHX5538

## 2023-04-07 HISTORY — PX: REMOVAL OF STONES: SHX5545

## 2023-04-07 HISTORY — PX: BILIARY BRUSHING: SHX6843

## 2023-04-07 HISTORY — PX: ERCP: SHX5425

## 2023-04-07 HISTORY — PX: SPHINCTEROTOMY: SHX5279

## 2023-04-07 LAB — CBC
HCT: 27.4 % — ABNORMAL LOW (ref 36.0–46.0)
Hemoglobin: 8.9 g/dL — ABNORMAL LOW (ref 12.0–15.0)
MCH: 28.2 pg (ref 26.0–34.0)
MCHC: 32.5 g/dL (ref 30.0–36.0)
MCV: 86.7 fL (ref 80.0–100.0)
Platelets: 143 10*3/uL — ABNORMAL LOW (ref 150–400)
RBC: 3.16 MIL/uL — ABNORMAL LOW (ref 3.87–5.11)
RDW: 18.6 % — ABNORMAL HIGH (ref 11.5–15.5)
WBC: 6.5 10*3/uL (ref 4.0–10.5)
nRBC: 0 % (ref 0.0–0.2)

## 2023-04-07 LAB — COMPREHENSIVE METABOLIC PANEL
ALT: 112 U/L — ABNORMAL HIGH (ref 0–44)
AST: 160 U/L — ABNORMAL HIGH (ref 15–41)
Albumin: 2 g/dL — ABNORMAL LOW (ref 3.5–5.0)
Alkaline Phosphatase: 540 U/L — ABNORMAL HIGH (ref 38–126)
Anion gap: 8 (ref 5–15)
BUN: 17 mg/dL (ref 8–23)
CO2: 17 mmol/L — ABNORMAL LOW (ref 22–32)
Calcium: 8.7 mg/dL — ABNORMAL LOW (ref 8.9–10.3)
Chloride: 113 mmol/L — ABNORMAL HIGH (ref 98–111)
Creatinine, Ser: 1.68 mg/dL — ABNORMAL HIGH (ref 0.44–1.00)
GFR, Estimated: 30 mL/min — ABNORMAL LOW (ref >60)
Glucose, Bld: 162 mg/dL — ABNORMAL HIGH (ref 70–99)
Potassium: 3.4 mmol/L — ABNORMAL LOW (ref 3.5–5.1)
Sodium: 138 mmol/L (ref 135–145)
Total Bilirubin: 13.3 mg/dL — ABNORMAL HIGH (ref 0.3–1.2)
Total Protein: 5 g/dL — ABNORMAL LOW (ref 6.5–8.1)

## 2023-04-07 LAB — GLUCOSE, CAPILLARY
Glucose-Capillary: 127 mg/dL — ABNORMAL HIGH (ref 70–99)
Glucose-Capillary: 131 mg/dL — ABNORMAL HIGH (ref 70–99)
Glucose-Capillary: 162 mg/dL — ABNORMAL HIGH (ref 70–99)
Glucose-Capillary: 174 mg/dL — ABNORMAL HIGH (ref 70–99)
Glucose-Capillary: 206 mg/dL — ABNORMAL HIGH (ref 70–99)
Glucose-Capillary: 322 mg/dL — ABNORMAL HIGH (ref 70–99)

## 2023-04-07 SURGERY — ERCP, WITH INTERVENTION IF INDICATED
Anesthesia: General

## 2023-04-07 MED ORDER — DICLOFENAC SUPPOSITORY 100 MG
RECTAL | Status: AC
  Filled 2023-04-07: qty 1

## 2023-04-07 MED ORDER — GLUCAGON HCL RDNA (DIAGNOSTIC) 1 MG IJ SOLR
INTRAMUSCULAR | Status: AC
  Filled 2023-04-07: qty 1

## 2023-04-07 MED ORDER — CIPROFLOXACIN IN D5W 400 MG/200ML IV SOLN
400.0000 mg | Freq: Two times a day (BID) | INTRAVENOUS | Status: DC
  Administered 2023-04-07 (×2): 400 mg via INTRAVENOUS
  Filled 2023-04-07 (×3): qty 200

## 2023-04-07 MED ORDER — FENTANYL CITRATE (PF) 100 MCG/2ML IJ SOLN
INTRAMUSCULAR | Status: DC | PRN
  Administered 2023-04-07: 50 ug via INTRAVENOUS

## 2023-04-07 MED ORDER — SUGAMMADEX SODIUM 200 MG/2ML IV SOLN
INTRAVENOUS | Status: DC | PRN
  Administered 2023-04-07: 200 mg via INTRAVENOUS

## 2023-04-07 MED ORDER — POTASSIUM CHLORIDE CRYS ER 20 MEQ PO TBCR
40.0000 meq | EXTENDED_RELEASE_TABLET | Freq: Once | ORAL | Status: AC
  Administered 2023-04-07: 40 meq via ORAL
  Filled 2023-04-07: qty 2

## 2023-04-07 MED ORDER — ONDANSETRON HCL 4 MG/2ML IJ SOLN
INTRAMUSCULAR | Status: DC | PRN
Start: 2023-04-07 — End: 2023-04-07
  Administered 2023-04-07: 4 mg via INTRAVENOUS

## 2023-04-07 MED ORDER — CIPROFLOXACIN IN D5W 400 MG/200ML IV SOLN
INTRAVENOUS | Status: AC
  Filled 2023-04-07: qty 200

## 2023-04-07 MED ORDER — DEXAMETHASONE SODIUM PHOSPHATE 10 MG/ML IJ SOLN
INTRAMUSCULAR | Status: DC | PRN
  Administered 2023-04-07: 4 mg via INTRAVENOUS

## 2023-04-07 MED ORDER — DICLOFENAC SUPPOSITORY 100 MG
RECTAL | Status: DC | PRN
  Administered 2023-04-07: 100 mg via RECTAL

## 2023-04-07 MED ORDER — LIDOCAINE 2% (20 MG/ML) 5 ML SYRINGE
INTRAMUSCULAR | Status: DC | PRN
  Administered 2023-04-07: 60 mg via INTRAVENOUS

## 2023-04-07 MED ORDER — SODIUM CHLORIDE 0.9 % IV SOLN
INTRAVENOUS | Status: DC | PRN
  Administered 2023-04-07: 25 mL

## 2023-04-07 MED ORDER — PHENYLEPHRINE 80 MCG/ML (10ML) SYRINGE FOR IV PUSH (FOR BLOOD PRESSURE SUPPORT)
PREFILLED_SYRINGE | INTRAVENOUS | Status: DC | PRN
  Administered 2023-04-07: 160 ug via INTRAVENOUS

## 2023-04-07 MED ORDER — CIPROFLOXACIN IN D5W 400 MG/200ML IV SOLN
INTRAVENOUS | Status: DC | PRN
  Administered 2023-04-07: 400 mg via INTRAVENOUS

## 2023-04-07 MED ORDER — ROCURONIUM BROMIDE 10 MG/ML (PF) SYRINGE
PREFILLED_SYRINGE | INTRAVENOUS | Status: DC | PRN
  Administered 2023-04-07: 40 mg via INTRAVENOUS

## 2023-04-07 MED ORDER — METOPROLOL TARTRATE 50 MG PO TABS
50.0000 mg | ORAL_TABLET | Freq: Two times a day (BID) | ORAL | Status: DC
  Administered 2023-04-07 – 2023-04-09 (×4): 50 mg via ORAL
  Filled 2023-04-07 (×4): qty 1

## 2023-04-07 MED ORDER — PROPOFOL 10 MG/ML IV BOLUS
INTRAVENOUS | Status: DC | PRN
Start: 2023-04-07 — End: 2023-04-07
  Administered 2023-04-07: 100 mg via INTRAVENOUS

## 2023-04-07 MED ORDER — PHENYLEPHRINE HCL-NACL 20-0.9 MG/250ML-% IV SOLN
INTRAVENOUS | Status: DC | PRN
  Administered 2023-04-07: 60 ug/min via INTRAVENOUS

## 2023-04-07 MED ORDER — LACTATED RINGERS IV SOLN
INTRAVENOUS | Status: DC | PRN
  Administered 2023-04-07: 1000 mL via INTRAVENOUS

## 2023-04-07 NOTE — Plan of Care (Signed)

## 2023-04-07 NOTE — Progress Notes (Signed)
Amorie H Porada 10:25 AM  Subjective: Patient doing well without any new complaints and we rediscussed the procedure and we answered all of her questions and discussed other options including PTC and surgery Objective: Signs stable afebrile no acute distress exam please see preassessment evaluation chemistry is about the same potassium little low hemoglobin slight drop other labs okay  Assessment: Probable obstructive jaundice in patient with liver mass  Plan: Okay to proceed with ERCP with anesthesia assistance today with further workup and plans pending those findings  [facility]  office [phone] After 5PM or if no answer call [phone]

## 2023-04-07 NOTE — Anesthesia Postprocedure Evaluation (Signed)
Anesthesia Post Note  Patient: [name]  Procedure(s) Performed: ENDOSCOPIC RETROGRADE CHOLANGIOPANCREATOGRAPHY (ERCP) SPHINCTEROTOMY REMOVAL OF STONES BILIARY BRUSHING BILIARY STENT PLACEMENT     Patient location during evaluation: PACU Anesthesia Type: General Level of consciousness: awake and alert Pain management: pain level controlled Vital Signs Assessment: post-procedure vital signs reviewed and stable Respiratory status: spontaneous breathing, nonlabored ventilation and respiratory function stable Cardiovascular status: blood pressure returned to baseline and stable Postop Assessment: no apparent nausea or vomiting Anesthetic complications: no  No notable events documented.  Last Vitals:  Vitals:   04/07/23 1145 04/07/23 1218  BP: (!) 162/61 (!) 168/57  Pulse: 79 70  Resp: 17 16  Temp: 36.5 C (!) 36.4 C  SpO2: 98% 100%    Last Pain:  Vitals:   04/07/23 1218  TempSrc: Oral  PainSc:                  Neiman Roots,W. EDMOND

## 2023-04-07 NOTE — Progress Notes (Signed)
PROGRESS NOTE [name]  ZOX:[id number] DOB: May 05, 1941 DOA: 04/04/2023 PCP: Shon Hale, MD  Brief Narrative/Hospital Course: 82 y.o.f w/ hypertension,IDDM,CAD,PAD,and cholecystectomy in January of this year presented to the ED with jaundice. Patient reports approximately 1 week of fatigue, loss of appetite, and postprandial bloating AND noticed to have jaundice so came to the ED.She reports history of chronic diarrhea which is unchanged. In WJ:XBJYNWGN and saturating well on room air with normal heart rate and elevated blood pressure. Labs are most notable for creatinine 1.87, alkaline phosphatase 646, AST 185, ALT 137, and total bilirubin 13.1. CT  abd > demonstrates ill-defined hypodensity in the region of the gallbladder fossa which appears similar to prior study. Patient was given 1 L of normal saline in the ED and admitted for further management. 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 Freida Busman 10/12/2022. MRCP 7/12>>"1. Unusual but increasingly aggressive and mass-like appearing lesion in segment 5 of the liver adjacent to the gallbladder fossa, concerning for potential neoplasm" advised to see sampling/PET/CT, also there is possibility of postoperative stricture, ERCP could be considered. GI and general surgery was consulted> with plan for ERCP 7/14 and liver biopsy 7/15     Subjective: Patient seen and examined this morning  Alert awake oriented resting comfortably no complaints  Asking about her losartan N.p.o. for ERCP today  Assessment and Plan: Principal Problem:   Jaundice Active Problems:   DM2 (diabetes mellitus, type 2) (HCC)   Carotid  artery disease (HCC)   PAD (peripheral artery disease) (HCC)   Acute renal failure superimposed on stage 3b chronic kidney disease (HCC)   Coronary artery disease involving native heart without angina pectoris   Abnormal LFTs/transaminitis Previous partial fenestrating cholecystectomy by Dr. Freida Busman 10/12/2022: Underwent CT abdomen as well as MRCP-concerning for liver mass possible biliary stricture possible clips from surgery-See report for detail.  GI and surgeon has seen the patient- per surgery Dr Allen>there was signal loss in the common bile duct at the bifurcation which appears separate from the solid/cystic mass, interpreted as possible artifact from surgical clips on MRI however per Dr Freida Busman no clips was placed during surgery as it was a partial cholecystectomy, only the dome of the gallbladder was opened so stricture from vascular injury is also extremely unlikely and finding concerning for malignancy. Appreciate GI input for ERCP 7/14 Xarelto on hold For liver biopsy on 7/15. Recent Labs  Lab 04/04/23 2138 04/05/23 0205 04/06/23 0613 04/07/23 0426  AST 185* 163* 171* 160*  ALT 137* 115* 113* 112*  ALKPHOS 646* 550* 587* 540*  BILITOT 13.1* 11.1* 13.1* 13.3*  PROT 7.1 5.7* 5.5* 5.0*  ALBUMIN 3.0* 2.5* 2.2* 2.0*  INR  --  1.7*  --   --     AKI on CKD 3b Metabolic acidosis bicarb 18-17:  B/l~1.2, last month. No hydronephrosis on CT in ED. continue on gentle IV fluid hydration, continue to hold hctz, ARB, Jardiance, and metformin. Avoid nephrotoxic meds and repeat labs.  Holding losartan. Recent Labs    08/28/22 0912 10/08/22 1327 10/13/22 0825 11/09/22 5621 11/16/22 0830 12/07/22 0845 03/11/23 0823 04/04/23 2138 04/05/23 0205 04/05/23 1430 04/06/23 0613 04/07/23 0426  BUN 15 23 27* 30* 22 23 25* 27* 25* 20 18 17  CREATININE 0.86 1.09* 1.33* 1.00 1.10* 1.03 1.21* 1.87* 1.75* 1.62* 1.73* 1.68*  CO2 28 24 25   --   --  29 27 18* 17* 19* 19* 17*     Hypokalemia Hypomagnesemia: Resolved  CAD:  No chest pain  PAD:  No acute ischemia, cont to hold Xarelto given need for ERCP and biopsy. hold Lipitor for now.Patient reports she had her Xarelto held for cholecystectomy previously without issue no history of DVT or PE.   Hypertension:  BP fairly controlled,cont to hold hydrochlorothiazide and ARB as above, increased metoprolol 37.5>50 mg bid  Type II DM with hyperglycemia:  hba1c was 7.9% in June 2024, blood sugar stable continue ssi Recent Labs  Lab 04/06/23 0802 04/06/23 1702 04/06/23 1948 04/07/23 0017 04/07/23 0604  GLUCAP 152* 217* 154* 131* 162*     Normocytic anemia: hemoglobin slightly downtrending, continue to monitor anemia panel shows stable iron B12 and folate.  Recent Labs    11/16/22 0830 04/04/23 2138 04/05/23 0205 04/06/23 0613 04/07/23 0426  HGB 11.9* 11.3* 9.6* 9.5* 8.9*  MCV  --  86.6 85.4 87.8 86.7  VITAMINB12  --   --   --  1,567*  --   FOLATE  --   --   --  7.9  --   FERRITIN  --   --   --  199  --   TIBC  --   --   --  287  --   IRON  --   --   --  61  --   RETICCTPCT  --   --   --  1.2  --    DVT prophylaxis: SCDs Start: 04/05/23 0030.SQ Heparin until 7/13 night Code Status:   Code Status: Full Code Family Communication: plan of care discussed with patient and daughters at bedside. Patient status is: Inpatient because of abnormal LFTs Level of care: Med-Surg   Dispo: The patient is from: home            Anticipated disposition: TBD Objective: Vitals last 24 hrs: Vitals:   04/07/23 0014 04/07/23 0604 04/07/23 0615 04/07/23 0736  BP: (!) 163/63 (!) 185/74  (!) 163/55  Pulse: 77 92  91  Resp: 16 16  16   Temp: 97.9 F (36.6 C) 97.7 F (36.5 C)  98.2 F (36.8 C)  TempSrc: Oral Oral  Oral  SpO2: 99% 99%  99%  Weight:   67.3 kg   Height:       Weight change: 1.089 kg  Physical Examination: General exam: alert awake, oriented x3 HEENT:Oral mucosa moist, Ear/Nose WNL  grossly Respiratory system: Bilaterally clear BS,no use of accessory muscle Cardiovascular system: S1 & S2 +, No JVD. Gastrointestinal system: Abdomen soft,NT,ND, BS+ Nervous System: Alert, awake, moving all extremities,and following commands. Extremities: LE edema neg,distal peripheral pulses palpable and warm.  Skin: No rashes, generalized icterus +++. MSK: Normal muscle bulk,tone, power   Medications reviewed:  Scheduled Meds:  brimonidine  1 drop Right Eye BID   insulin aspart  0-6 Units Subcutaneous Q4H   metoprolol tartrate  50 mg Oral BID   pantoprazole  40 mg Oral Daily   Continuous Infusions:  sodium chloride Stopped (04/07/23 0642)   dextrose 5 % and 0.9 % NaCl Stopped (04/07/23 1610)    Diet Order             Diet NPO time specified Except for: Sips with Meds  Diet effective midnight  Intake/Output Summary (Last 24 hours) at 04/07/2023 0910 Last data filed at 04/07/2023 1610 Gross per 24 hour  Intake 2560.88 ml  Output --  Net 2560.88 ml   Net IO Since Admission: 5,012.13 mL [04/07/23 0910]  Wt Readings from Last 3 Encounters:  04/07/23 67.3 kg  03/14/23 65.8 kg  12/25/22 64.9 kg     Unresulted Labs (From admission, onward)     Start     Ordered   04/08/23 0500  Protime-INR  ONCE - STAT,   R        04/05/23 1605   04/08/23 0500  Comprehensive metabolic panel  Tomorrow morning,   R        04/07/23 0811   04/08/23 0500  CBC  Tomorrow morning,   R        04/07/23 0811          Data Reviewed: I have personally reviewed following labs and imaging studies CBC: Recent Labs  Lab 04/04/23 2138 04/05/23 0205 04/06/23 0613 04/07/23 0426  WBC 11.0* 9.9 7.5 6.5  NEUTROABS 7.8*  --   --   --   HGB 11.3* 9.6* 9.5* 8.9*  HCT 34.9* 28.7* 29.5* 27.4*  MCV 86.6 85.4 87.8 86.7  PLT 210 171 147* 143*   Basic Metabolic Panel: Recent Labs  Lab 04/04/23 2138 04/05/23 0205 04/05/23 1430 04/06/23 0613 04/07/23 0426  NA 137 138 141 139  138  K 3.2* 2.9* 4.6 4.7 3.4*  CL 104 110 114* 113* 113*  CO2 18* 17* 19* 19* 17*  GLUCOSE 159* 60* 86 144* 162*  BUN 27* 25* 20 18 17   CREATININE 1.87* 1.75* 1.62* 1.73* 1.68*  CALCIUM 9.1 8.4* 9.2 9.0 8.7*  MG  --  1.4*  --  2.2  --    GFR: Estimated Creatinine Clearance: 23.6 mL/min (A) (by C-G formula based on SCr of 1.68 mg/dL (H)). Liver Function Tests: Recent Labs  Lab 04/04/23 2138 04/05/23 0205 04/06/23 0613 04/07/23 0426  AST 185* 163* 171* 160*  ALT 137* 115* 113* 112*  ALKPHOS 646* 550* 587* 540*  BILITOT 13.1* 11.1* 13.1* 13.3*  PROT 7.1 5.7* 5.5* 5.0*  ALBUMIN 3.0* 2.5* 2.2* 2.0*   Recent Labs  Lab 04/05/23 0205  LIPASE 43   No results for input(s): "AMMONIA" in the last 168 hours. Coagulation Profile: Recent Labs  Lab 04/05/23 0205  INR 1.7*  CBG: Recent Labs  Lab 04/06/23 0802 04/06/23 1702 04/06/23 1948 04/07/23 0017 04/07/23 0604  GLUCAP 152* 217* 154* 131* 162*  No results found for this or any previous visit (from the past 240 hour(s)).  Antimicrobials: Anti-infectives (From admission, onward)    None      Culture/Microbiology    Component Value Date/Time   SDES  09/20/2021 1448    WOUND RIGHT FOOT Performed at [name], [address], [place], [place] [address]    SPECREQUEST  09/20/2021 1448    NONE Performed at [facility] [address], [place], [place] [address]    CULT  09/20/2021 1448    MODERATE CORYNEBACTERIUM STRIATUM Standardized susceptibility testing for this organism is not available. NO ANAEROBES ISOLATED Performed at [facility], [address] [place], [place] [address]    REPTSTATUS 09/26/2021 FINAL 09/20/2021 1448  Radiology Studies: MR ABDOMEN MRCP W WO CONTAST  Result Date: 04/05/2023 CLINICAL DATA:  81 year old female with history of jaundice. Status post laparoscopic cholecystectomy 10/12/2022. EXAM: MRI ABDOMEN WITHOUT AND WITH CONTRAST  (  INCLUDING MRCP) TECHNIQUE: Multiplanar multisequence MR imaging of the abdomen was performed both before and after the administration of intravenous contrast. Heavily T2-weighted images of the biliary and pancreatic ducts were obtained, and three-dimensional MRCP images were rendered by post processing. CONTRAST:  6mL GADAVIST GADOBUTROL 1 MMOL/ML IV SOLN COMPARISON:  Abdominal MRI 08/30/2022. CT of the abdomen and pelvis 04/04/2023. FINDINGS: Lower chest: Susceptibility artifact in the lower sternum from indwelling median sternotomy wires. Hepatobiliary: Patient is status post cholecystectomy. When compared to the prior examination, there is increasingly prominent mass-like architectural distortion in the liver adjacent to the gallbladder fossa centered predominantly in segment 5. This areas heterogeneous in signal intensity on T1 and T2 weighted images, but generally centrally T1 hypointense with heterogeneous T2 hyperintensity (including central areas that appear cystic). There is extensive hyperenhancement during arterial phase imaging in the surrounding hepatic parenchyma, but the central nidus of the lesion is estimated to measure approximately 3.5 x 2.5 x 2.0 cm on axial image 78 of series 1001 and coronal image 83 of series 12, where the lesion appears centrally hypovascular with a peripheral rim of enhancement. The peripheral hypervascularity appears to resolve rapidly on more delayed post gadolinium imaging (although subtraction imaging demonstrates persistent peripheral enhancement), and the lesion appears hypovascular centrally on delayed imaging. Diffusion imaging demonstrates heterogeneous areas of diffusion restriction associated with this lesion. MRCP images demonstrates some mild intrahepatic biliary ductal dilatation. There is signal void in the region of the common hepatic duct (coronal MRCP image 68 of series 6), which could be related to artifact from cholecystectomy clips. Immediately distal  to this the common hepatic duct measures 5 mm. Cystic duct remnant is not dilated. Common bile duct is normal in caliber measuring only 4 mm in the porta hepatis. No filling defects are noted in the common bile duct to suggest choledocholithiasis. Pancreas: No pancreatic mass. No pancreatic ductal dilatation. No pancreatic or peripancreatic fluid collections or inflammatory changes. Spleen:  Unremarkable. Adrenals/Urinary Tract: Subcentimeter T1 hypointense, T2 hyperintense, nonenhancing lesion in the lower pole of the left kidney, compatible with a tiny simple cyst (Bosniak class 1, no imaging follow-up recommended). Right kidney and bilateral adrenal glands are normal in appearance. No hydroureteronephrosis in the visualized portions of the abdomen. Stomach/Bowel: Visualized portions are unremarkable. Vascular/Lymphatic: No aneurysm identified in the visualized abdominal vasculature. No lymphadenopathy noted in the abdomen. Other: No significant volume of ascites noted in the visualized portions of the peritoneal cavity. Musculoskeletal: No aggressive appearing osseous lesions are noted in the visualized portions of the skeleton. IMPRESSION: 1. Unusual but increasingly aggressive and mass-like appearing lesion in segment 5 of the liver adjacent to the gallbladder fossa, concerning for potential neoplasm. Tissue sampling should be considered to establish a tissue diagnosis. Further evaluation with PET-CT should also be considered. 2. Mild intrahepatic biliary ductal dilatation, new compared to the preoperative study before laparoscopic cholecystectomy. Today's study demonstrates signal void in the region of the common hepatic duct. This could be artifactual and related to cholecystectomy clips, however, the possibility of postoperative stricture is not excluded. Correlation with liver function tests is recommended. Lack of common bile duct dilatation is unusual in this post cholecystectomy patient, and could  suggest narrowing of the common hepatic duct. ERCP could be considered for further evaluation if clinically appropriate. 3. Additional incidental findings, as above. These results will be called to the ordering clinician or representative by the Radiologist Assistant, and communication documented in the PACS or [employer]. Electronically Signed   By: Reuel Boom  Entrikin M.D.   On: 04/05/2023 10:20   MR 3D Recon At Scanner  Result Date: 04/05/2023 CLINICAL DATA:  82 year old female with history of jaundice. Status post laparoscopic cholecystectomy 10/12/2022. EXAM: MRI ABDOMEN WITHOUT AND WITH CONTRAST (INCLUDING MRCP) TECHNIQUE: Multiplanar multisequence MR imaging of the abdomen was performed both before and after the administration of intravenous contrast. Heavily T2-weighted images of the biliary and pancreatic ducts were obtained, and three-dimensional MRCP images were rendered by post processing. CONTRAST:  6mL GADAVIST GADOBUTROL 1 MMOL/ML IV SOLN COMPARISON:  Abdominal MRI 08/30/2022. CT of the abdomen and pelvis 04/04/2023. FINDINGS: Lower chest: Susceptibility artifact in the lower sternum from indwelling median sternotomy wires. Hepatobiliary: Patient is status post cholecystectomy. When compared to the prior examination, there is increasingly prominent mass-like architectural distortion in the liver adjacent to the gallbladder fossa centered predominantly in segment 5. This areas heterogeneous in signal intensity on T1 and T2 weighted images, but generally centrally T1 hypointense with heterogeneous T2 hyperintensity (including central areas that appear cystic). There is extensive hyperenhancement during arterial phase imaging in the surrounding hepatic parenchyma, but the central nidus of the lesion is estimated to measure approximately 3.5 x 2.5 x 2.0 cm on axial image 78 of series 1001 and coronal image 83 of series 12, where the lesion appears centrally hypovascular with a peripheral rim of  enhancement. The peripheral hypervascularity appears to resolve rapidly on more delayed post gadolinium imaging (although subtraction imaging demonstrates persistent peripheral enhancement), and the lesion appears hypovascular centrally on delayed imaging. Diffusion imaging demonstrates heterogeneous areas of diffusion restriction associated with this lesion. MRCP images demonstrates some mild intrahepatic biliary ductal dilatation. There is signal void in the region of the common hepatic duct (coronal MRCP image 68 of series 6), which could be related to artifact from cholecystectomy clips. Immediately distal to this the common hepatic duct measures 5 mm. Cystic duct remnant is not dilated. Common bile duct is normal in caliber measuring only 4 mm in the porta hepatis. No filling defects are noted in the common bile duct to suggest choledocholithiasis. Pancreas: No pancreatic mass. No pancreatic ductal dilatation. No pancreatic or peripancreatic fluid collections or inflammatory changes. Spleen:  Unremarkable. Adrenals/Urinary Tract: Subcentimeter T1 hypointense, T2 hyperintense, nonenhancing lesion in the lower pole of the left kidney, compatible with a tiny simple cyst (Bosniak class 1, no imaging follow-up recommended). Right kidney and bilateral adrenal glands are normal in appearance. No hydroureteronephrosis in the visualized portions of the abdomen. Stomach/Bowel: Visualized portions are unremarkable. Vascular/Lymphatic: No aneurysm identified in the visualized abdominal vasculature. No lymphadenopathy noted in the abdomen. Other: No significant volume of ascites noted in the visualized portions of the peritoneal cavity. Musculoskeletal: No aggressive appearing osseous lesions are noted in the visualized portions of the skeleton. IMPRESSION: 1. Unusual but increasingly aggressive and mass-like appearing lesion in segment 5 of the liver adjacent to the gallbladder fossa, concerning for potential neoplasm.  Tissue sampling should be considered to establish a tissue diagnosis. Further evaluation with PET-CT should also be considered. 2. Mild intrahepatic biliary ductal dilatation, new compared to the preoperative study before laparoscopic cholecystectomy. Today's study demonstrates signal void in the region of the common hepatic duct. This could be artifactual and related to cholecystectomy clips, however, the possibility of postoperative stricture is not excluded. Correlation with liver function tests is recommended. Lack of common bile duct dilatation is unusual in this post cholecystectomy patient, and could suggest narrowing of the common hepatic duct. ERCP could be considered for further evaluation  if clinically appropriate. 3. Additional incidental findings, as above. These results will be called to the ordering clinician or representative by the Radiologist Assistant, and communication documented in the PACS or [employer]. Electronically Signed   By: Trudie Reed M.D.   On: 04/05/2023 10:20     LOS: 3 days   Lanae Boast, MD [facility]  04/07/2023, 9:10 AM

## 2023-04-07 NOTE — Transfer of Care (Signed)
Immediate Anesthesia Transfer of Care Note  Patient: [name]  Procedure(s) Performed: ENDOSCOPIC RETROGRADE CHOLANGIOPANCREATOGRAPHY (ERCP) SPHINCTEROTOMY REMOVAL OF STONES BILIARY BRUSHING BILIARY STENT PLACEMENT  Patient Location: PACU  Anesthesia Type:General  Level of Consciousness: drowsy, patient cooperative, and responds to stimulation  Airway & Oxygen Therapy: Patient Spontanous Breathing  Post-op Assessment: Report given to RN and Post -op Vital signs reviewed and stable  Post vital signs: Reviewed and stable  Last Vitals:  Vitals Value Taken Time  BP    Temp    Pulse 83 04/07/23 1122  Resp 15 04/07/23 1122  SpO2 97 % 04/07/23 1122  Vitals shown include unfiled device data.  Last Pain:  Vitals:   04/07/23 0951  TempSrc: Temporal  PainSc: 0-No pain         Complications: No notable events documented.

## 2023-04-07 NOTE — Op Note (Signed)
[facility] Patient Name: [name] Procedure Date : 04/07/2023 MRN: [id number] Attending MD: Vida Rigger , MD, [id number] Date of Birth: Sep 23, 1941 CSN: [id number] Age: 82 Admit Type: Inpatient Procedure:                ERCP Indications:              Abnormal MRCP, Jaundice turned over obstructive                            jaundice Providers:                Vida Rigger, MD, Eliberto Ivory, RN, Doristine Mango,                            RN, Marja Kays, Technician Referring MD:              Medicines:                General Anesthesia Complications:            No immediate complications. Estimated Blood Loss:     Estimated blood loss: none. Procedure:                Pre-Anesthesia Assessment:                           - Prior to the procedure, a History and Physical                            was performed, and patient medications and                            allergies were reviewed. The patient's tolerance of                            previous anesthesia was also reviewed. The risks                            and benefits of the procedure and the sedation                            options and risks were discussed with the patient.                            All questions were answered, and informed consent                            was obtained. Prior Anticoagulants: The patient has                            taken Xarelto (rivaroxaban), last dose was 3 days                            prior to procedure. ASA Grade Assessment: III - A  patient with severe systemic disease. After                            reviewing the risks and benefits, the patient was                            deemed in satisfactory condition to undergo the                            procedure.                           After obtaining informed consent, the scope was                            passed under direct vision. Throughout the                             procedure, the patient's blood pressure, pulse, and                            oxygen saturations were monitored continuously. The                            [id number]-Q190V ([id number] Olympus duodenoscope was                            introduced through the mouth, and used to inject                            contrast into and used to cannulate the bile duct.                            The ERCP was accomplished without difficulty. The                            patient tolerated the procedure well. 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 The major papilla was       normal. 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 There was no post-sphincterotomy bleeding. We could       get the three quarters bowed sphincterotome easily in and out of the       duct and to discover objects since we did see a little filling defect       which was probably just an air bubble, the biliary tree was swept with       an adjustable 9- 12 mm balloon starting at the bifurcation. Nothing was       found multiple balloon pull-through's. We also used the  balloon for an       occlusion cholangiogram just below the stricture which did confirm the       stricture however in advancing the balloon past the stricture we were       able to withdrawal through the stricture with the 9 mm balloon inflated       without much resistance and cells for cytology were obtained by brushing       in the hepatic duct bifurcation. We then placed one 10 Fr by 12 cm       temporary plastic biliary stent with a single external flap and a single       internal flap was placed 11.5 cm into the common bile duct probably into        the right system. The stent was in good position however there was not       much drainage at this point and the introducer and wire were removed and       the scope was removed and there was no pancreatic duct injection or wire       advancement throughout the procedure the patient tolerated the procedure       well. 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                           - The major papilla appeared normal.                           - A biliary sphincterotomy was performed.                           - The biliary tree was swept and nothing was found.                           - Cells for cytology obtained at the hepatic duct                            bifurcation.                           - One temporary plastic biliary stent was placed                            into the common bile duct probably right system. Recommendation:           - Clear liquid diet for 6 hours. If doing well this                            evening may have soft solids                           - Continue present medications. Will use Cipro for                            3 days case stent does not work to treat possible  cholangitis if no drainage is obtained                           - Resume Xarelto (rivaroxaban) at prior dose As per                            IR after liver biopsy tomorrow. May resume subcu                            heparin today from my standpoint                           - Await cytology results.                           - Return to GI clinic PRN. Follow liver tests over                            the next few days                           - Telephone GI clinic for pathology results in 5                            days.                           - Telephone GI clinic if symptomatic PRN. Procedure Code(s):        --- Professional ---                           [phone], Endoscopic retrograde                             cholangiopancreatography (ERCP); with placement of                            endoscopic stent into biliary or pancreatic duct,                            including pre- and post-dilation and guide wire                            passage, when performed, including sphincterotomy,                            when performed, each stent Diagnosis Code(s):        --- Professional ---                           R17, Unspecified jaundice                           R93.2, Abnormal findings on diagnostic imaging of  liver and biliary tract CPT copyright 2022 [employer]. All rights reserved. The codes documented in this report are preliminary and upon coder review may  be revised to meet current compliance requirements. Vida Rigger, MD 04/07/2023 11:29:13 AM This report has been signed electronically. Number of Addenda: 0

## 2023-04-07 NOTE — Anesthesia Preprocedure Evaluation (Addendum)
Anesthesia Evaluation  Patient identified by MRN, date of birth, ID band Patient awake    Reviewed: Allergy & Precautions, H&P , NPO status , Patient's Chart, lab work & pertinent test results  Airway Mallampati: III  TM Distance: >3 FB Neck ROM: Full    Dental no notable dental hx. (+) Teeth Intact, Dental Advisory Given   Pulmonary former smoker   Pulmonary exam normal breath sounds clear to auscultation       Cardiovascular hypertension, Pt. on medications and Pt. on home beta blockers + CAD, + Past MI, + CABG and + Peripheral Vascular Disease   Rhythm:Regular Rate:Normal     Neuro/Psych negative neurological ROS  negative psych ROS   GI/Hepatic Neg liver ROS,GERD  Medicated,,  Endo/Other  diabetes    Renal/GU Renal InsufficiencyRenal disease  negative genitourinary   Musculoskeletal  (+) Arthritis , Osteoarthritis,    Abdominal   Peds  Hematology  (+) Blood dyscrasia, anemia   Anesthesia Other Findings   Reproductive/Obstetrics negative OB ROS                             Anesthesia Physical Anesthesia Plan  ASA: 3  Anesthesia Plan: General   Post-op Pain Management: Minimal or no pain anticipated   Induction: Intravenous  PONV Risk Score and Plan: 3 and Ondansetron, Dexamethasone and Treatment may vary due to age or medical condition  Airway Management Planned: Oral ETT  Additional Equipment:   Intra-op Plan:   Post-operative Plan: Extubation in OR  Informed Consent: I have reviewed the patients History and Physical, chart, labs and discussed the procedure including the risks, benefits and alternatives for the proposed anesthesia with the patient or authorized representative who has indicated his/her understanding and acceptance.     Dental advisory given  Plan Discussed with: CRNA  Anesthesia Plan Comments:        Anesthesia Quick Evaluation

## 2023-04-07 NOTE — Anesthesia Procedure Notes (Signed)
Procedure Name: Intubation Date/Time: 04/07/2023 10:26 AM  Performed by: Drema Pry, CRNAPre-anesthesia Checklist: Patient identified, Emergency Drugs available, Suction available and Patient being monitored Patient Re-evaluated:Patient Re-evaluated prior to induction Oxygen Delivery Method: Circle System Utilized Preoxygenation: Pre-oxygenation with 100% oxygen Induction Type: IV induction Ventilation: Mask ventilation without difficulty Laryngoscope Size: Mac and 3 Grade View: Grade I Tube type: Oral Tube size: 7.0 mm Number of attempts: 1 Airway Equipment and Method: Stylet Placement Confirmation: ETT inserted through vocal cords under direct vision, positive ETCO2 and breath sounds checked- equal and bilateral Secured at: 20 cm Tube secured with: Tape Dental Injury: Teeth and Oropharynx as per pre-operative assessment

## 2023-04-08 DIAGNOSIS — R17 Unspecified jaundice: Secondary | ICD-10-CM | POA: Diagnosis not present

## 2023-04-08 LAB — GLUCOSE, CAPILLARY
Glucose-Capillary: 137 mg/dL — ABNORMAL HIGH (ref 70–99)
Glucose-Capillary: 161 mg/dL — ABNORMAL HIGH (ref 70–99)
Glucose-Capillary: 229 mg/dL — ABNORMAL HIGH (ref 70–99)
Glucose-Capillary: 232 mg/dL — ABNORMAL HIGH (ref 70–99)
Glucose-Capillary: 238 mg/dL — ABNORMAL HIGH (ref 70–99)
Glucose-Capillary: 291 mg/dL — ABNORMAL HIGH (ref 70–99)

## 2023-04-08 LAB — COMPREHENSIVE METABOLIC PANEL
ALT: 103 U/L — ABNORMAL HIGH (ref 0–44)
AST: 109 U/L — ABNORMAL HIGH (ref 15–41)
Albumin: 2 g/dL — ABNORMAL LOW (ref 3.5–5.0)
Alkaline Phosphatase: 498 U/L — ABNORMAL HIGH (ref 38–126)
Anion gap: 7 (ref 5–15)
BUN: 21 mg/dL (ref 8–23)
CO2: 19 mmol/L — ABNORMAL LOW (ref 22–32)
Calcium: 9.1 mg/dL (ref 8.9–10.3)
Chloride: 111 mmol/L (ref 98–111)
Creatinine, Ser: 1.48 mg/dL — ABNORMAL HIGH (ref 0.44–1.00)
GFR, Estimated: 35 mL/min — ABNORMAL LOW (ref >60)
Glucose, Bld: 158 mg/dL — ABNORMAL HIGH (ref 70–99)
Potassium: 3.8 mmol/L (ref 3.5–5.1)
Sodium: 137 mmol/L (ref 135–145)
Total Bilirubin: 5.6 mg/dL — ABNORMAL HIGH (ref 0.3–1.2)
Total Protein: 5.3 g/dL — ABNORMAL LOW (ref 6.5–8.1)

## 2023-04-08 LAB — CBC
HCT: 27.6 % — ABNORMAL LOW (ref 36.0–46.0)
Hemoglobin: 9.4 g/dL — ABNORMAL LOW (ref 12.0–15.0)
MCH: 28.6 pg (ref 26.0–34.0)
MCHC: 34.1 g/dL (ref 30.0–36.0)
MCV: 83.9 fL (ref 80.0–100.0)
Platelets: 175 10*3/uL (ref 150–400)
RBC: 3.29 MIL/uL — ABNORMAL LOW (ref 3.87–5.11)
RDW: 18.6 % — ABNORMAL HIGH (ref 11.5–15.5)
WBC: 8.4 10*3/uL (ref 4.0–10.5)
nRBC: 0 % (ref 0.0–0.2)

## 2023-04-08 LAB — PROTIME-INR
INR: 1.2 (ref 0.8–1.2)
Prothrombin Time: 15.6 seconds — ABNORMAL HIGH (ref 11.4–15.2)

## 2023-04-08 MED ORDER — HYDRALAZINE HCL 20 MG/ML IJ SOLN: 10.0000 mg | INTRAMUSCULAR | Status: DC | PRN

## 2023-04-08 MED ORDER — CIPROFLOXACIN HCL 500 MG PO TABS
500.0000 mg | ORAL_TABLET | Freq: Two times a day (BID) | ORAL | Status: DC
  Administered 2023-04-08 – 2023-04-09 (×3): 500 mg via ORAL
  Filled 2023-04-08 (×5): qty 1

## 2023-04-08 MED ORDER — GUAIFENESIN 100 MG/5ML PO LIQD: 5.0000 mL | ORAL | Status: DC | PRN

## 2023-04-08 MED ORDER — DEXTROSE-SODIUM CHLORIDE 5-0.9 % IV SOLN: INTRAVENOUS | Status: AC

## 2023-04-08 MED ORDER — SENNOSIDES-DOCUSATE SODIUM 8.6-50 MG PO TABS: 1.0000 | ORAL_TABLET | Freq: Every evening | ORAL | Status: DC | PRN

## 2023-04-08 MED ORDER — ACETAMINOPHEN 325 MG PO TABS: 650.0000 mg | ORAL_TABLET | Freq: Four times a day (QID) | ORAL | Status: DC | PRN

## 2023-04-08 MED ORDER — IPRATROPIUM-ALBUTEROL 0.5-2.5 (3) MG/3ML IN SOLN: 3.0000 mL | RESPIRATORY_TRACT | Status: DC | PRN

## 2023-04-08 MED ORDER — METOPROLOL TARTRATE 5 MG/5ML IV SOLN: 5.0000 mg | INTRAVENOUS | Status: DC | PRN

## 2023-04-08 NOTE — Progress Notes (Deleted)
[facility] OF   ASSESSMENT / PLAN: 82 y.o. female with complex past vascular history (see below for full detail). She has concerning features on duplex of bilateral lower extremity bypasses prompting angiography with intervention on the left and without intervention on the right. She is clinically stable. Her noninvasive studies remain worrisome. We will plan to monitor her closely. She knows to call should any limb threatening symptoms develop.    CHIEF COMPLAINT: surveillance of PAD  HISTORY OF PRESENT ILLNESS: [name] is a 82 y.o. female very well-known to me.  She returns to clinic after endovascular intervention in her right lower extremity to salvage her right femoral-popliteal bypass graft.  She tolerated this very well.  She did receive Versed, which he has a poor reaction to in the past.  She required observation overnight.  She was discharged next day without difficulty.  She is getting IV antibiotics.  She is getting hyperbaric oxygen therapy.  These both seem to be helping her foot.  She feels well overall.  02/13/22: Patient returns to clinic for follow-up.  Thankfully both feet are completely healed.  She is due to be seen by the podiatry team for foot orthotics.  She is very happy overall.  10/30/22: Doing well on surveillance. No new symptoms. No ulcers about feet. 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 Has recovered and is back to playing pool.  12/25/22: doing well after angiograms. Walking as much as she likes. Playing (and winning!) pool. No claudication symptoms. No ulcers about the feet. No rest pain.   VASCULAR SURGICAL HISTORY:  07/21/19: Right external iliac, common femoral, profundofemoral, and superficial femoral endarterectomy with bovine pericardial patch angioplasty 08/16/20: Right common femoral endarterectomy with bovine pericardial patch angioplasty; Right common femoral to above-knee popliteal artery bypass with 6  mm ringed PTFE 04/28/21: LLE angiogram 05/03/21: left common femoral - tibioperoneal trunk bypass with 6mm ringed ePTFE in anatomic tunnel; left third toe open amputation 05/11/21: Open left transmetatarsal amputation 09/07/21: Angioplasty of left lower extremity distal bypass anastomosis and anterior tibial artery (3 mm Sterling) 10/13/21: Right iliofemoral drug-coated angioplasty (6x50mm Ranger); Right popliteal drug-coated angioplasty (4x147mm Ranger); Right tibioperoneal trunk and peroneal angioplasty (3x86mm Sterling)  Past Medical History:  Diagnosis Date   Anemia    after CABG in june 2021   Arthritis    Cancer (HCC)    removal from nose - MOSE procedure    Complication of anesthesia    VERSED- agitated, muscle spasms, "jerking" , frantic , (never had this occurence in the pas)    Coronary artery disease    Diabetes mellitus without complication (HCC)    Dysrhythmia    PVC's   GERD (gastroesophageal reflux disease)    Heart murmur    History of hiatal hernia    Hyperlipidemia    Hypertension 11/20/11   ECHO- EF>55% Borderline concentric left ventricular hypertrophy. There is a small calcified mass in the L:A near the LA appendage. No valvular masses seen with associated mitral annular calcification. LA Volume/ BSA27.4 ml/m2 No AS. Right ventricular systolic pressure is elevated at .   Myocardial infarction [facility])    June 2021   Neuromuscular disorder (HCC)    neuropathy in bilateral feet   Peripheral vascular disease (HCC)    Pneumonia    not hosp.    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  PTCAs with drug coated balloon Lt ant tibial artery and Lt popliteal artery  11/26/2019  Past Surgical History:  Procedure Laterality Date   ABDOMINAL AORTAGRAM  11/25/2019   ABDOMINAL AORTOGRAM W/LOWER EXTREMITY    ABDOMINAL AORTOGRAM N/A 06/15/2019   Procedure: ABDOMINAL AORTOGRAM;  Surgeon: Runell Gess, MD;  [facility];  Service:  Cardiovascular;  Laterality: N/A;   ABDOMINAL AORTOGRAM W/LOWER EXTREMITY N/A 11/25/2019   Procedure: ABDOMINAL AORTOGRAM W/LOWER EXTREMITY;  Surgeon: Iran Ouch, MD;  [facility]: [facility];  Service: Cardiovascular;  Laterality: N/A;  Lt leg   ABDOMINAL AORTOGRAM W/LOWER EXTREMITY N/A 08/01/2020   Procedure: ABDOMINAL AORTOGRAM W/LOWER EXTREMITY;  Surgeon: Runell Gess, MD;  [facility];  Service: Cardiovascular;  Laterality: N/A;   ABDOMINAL AORTOGRAM W/LOWER EXTREMITY N/A 04/28/2021   Procedure: ABDOMINAL AORTOGRAM W/LOWER EXTREMITY;  Surgeon: Sherren Kerns, MD;  [facility];  Service: Cardiovascular;  Laterality: N/A;   ABDOMINAL AORTOGRAM W/LOWER EXTREMITY N/A 10/13/2021   Procedure: ABDOMINAL AORTOGRAM W/LOWER EXTREMITY;  Surgeon: Leonie Douglas, MD;  [facility];  Service: Cardiovascular;  Laterality: N/A;   ABDOMINAL AORTOGRAM W/LOWER EXTREMITY N/A 11/09/2022   Procedure: ABDOMINAL AORTOGRAM W/LOWER EXTREMITY;  Surgeon: Leonie Douglas, MD;  [facility];  Service: Cardiovascular;  Laterality: N/A;   ABDOMINAL AORTOGRAM W/LOWER EXTREMITY N/A 11/16/2022   Procedure: ABDOMINAL AORTOGRAM W/LOWER EXTREMITY;  Surgeon: Leonie Douglas, MD;  [facility];  Service: Cardiovascular;  Laterality: N/A;   AMPUTATION TOE Left 05/03/2021   Procedure: AMPUTATION OF THIRD LEFT  TOE;  Surgeon: Leonie Douglas, MD;  Location: [facility] OR;  Service: Vascular;  Laterality: Left;   APPENDECTOMY     APPLICATION OF WOUND VAC Right 07/21/2019   Procedure: Application Of Prevena Wound Vac Right Groin;  Surgeon: Nada Libman, MD;  [facility];  Service: Vascular;  Laterality: Right;   CARPAL TUNNEL RELEASE Left    CARPAL TUNNEL RELEASE Right    CESAREAN SECTION     x 2   CHOLECYSTECTOMY N/A 10/12/2022   Procedure: LAPAROSCOPIC PARTIAL FENESTRATING CHOLECYSTECTOMY;  Surgeon: Fritzi Mandes, MD;   [facility];  Service: General;  Laterality: N/A;   COLONOSCOPY     CORONARY ARTERY BYPASS GRAFT N/A 03/24/2020   Procedure: CORONARY ARTERY BYPASS GRAFTING (CABG) using LIMA to LAD (m); RIMA to RAMUS; Endoscopic Right Greater Saphenous Vein: SVG to Diag1; SVG to PLB (right); and SVG to PL (left).;  Surgeon: Linden Dolin, MD;  [facility];  Service: Open Heart Surgery;  Laterality: N/A;  BILATERAL IMA   ENDARTERECTOMY FEMORAL Right 07/21/2019   Procedure: RIGHT ENDARTERECTOMY FEMORAL WITH PATCH ANGIOPLASTY;  Surgeon: Nada Libman, MD;  [facility];  Service: Vascular;  Laterality: Right;   ENDARTERECTOMY FEMORAL Right 08/16/2020   Procedure: RIGHT FEMORAL ENDARTERECTOMY;  Surgeon: Maeola Harman, MD;  [facility] OR;  Service: Vascular;  Laterality: Right;   ENDOVEIN HARVEST OF GREATER SAPHENOUS VEIN Right 03/24/2020   Procedure: ENDOVEIN HARVEST OF GREATER SAPHENOUS VEIN;  Surgeon: Linden Dolin, MD;  [facility];  Service: Open Heart Surgery;  Laterality: Right;   EYE SURGERY     cataract removal bilaterally   FEMORAL-POPLITEAL BYPASS GRAFT Right 08/16/2020   Procedure: BYPASS GRAFT FEMORAL-POPLITEAL ARTERY;  Surgeon: Maeola Harman, MD;  Location: [facility] OR;  Service: Vascular;  Laterality: Right;   FEMORAL-POPLITEAL BYPASS GRAFT Left 05/03/2021   Procedure: LEFT FEMORAL TO BELOW KNEE POPLITEAL ARTERY BYPASS GRAFTING WITH 6MMX80 PTFE  GRAFT;  Surgeon: Leonie Douglas, MD;  Location: [facility] OR;  Service: Vascular;  Laterality: Left;   LEFT HEART CATH AND CORONARY ANGIOGRAPHY N/A 03/22/2020   Procedure: LEFT HEART CATH AND CORONARY ANGIOGRAPHY;  Surgeon: Swaziland, Peter M, MD;  Location: [facility] INVASIVE CV LAB;  Service: Cardiovascular;  Laterality: N/A;   LOWER EXTREMITY ANGIOGRAPHY Bilateral 06/15/2019   Procedure: Lower Extremity Angiography;  Surgeon: Runell Gess, MD;  Location: [facility] INVASIVE CV LAB;  Service: Cardiovascular;  Laterality: Bilateral;    LOWER EXTREMITY ANGIOGRAPHY Right 08/03/2019   Procedure: LOWER EXTREMITY ANGIOGRAPHY;  Surgeon: Runell Gess, MD;  [facility];  Service: Cardiovascular;  Laterality: Right;   LOWER EXTREMITY ANGIOGRAPHY N/A 09/07/2021   Procedure: LOWER EXTREMITY ANGIOGRAPHY;  Surgeon: Cephus Shelling, MD;  [facility];  Service: Cardiovascular;  Laterality: N/A;   PATCH ANGIOPLASTY Right 07/21/2019   Procedure: Patch Angioplasty Right Femoral Artery;  Surgeon: Nada Libman, MD;  Location: [facility] OR;  Service: Vascular;  Laterality: Right;   PERIPHERAL INTRAVASCULAR LITHOTRIPSY  11/25/2019   Procedure: INTRAVASCULAR LITHOTRIPSY;  Surgeon: Iran Ouch, MD;  [facility];  Service: Cardiovascular;;  LT. SFA   PERIPHERAL VASCULAR ATHERECTOMY  08/03/2019   Procedure: PERIPHERAL VASCULAR ATHERECTOMY;  Surgeon: Runell Gess, MD;  Location: [facility] INVASIVE CV LAB;  Service: Cardiovascular;;  right SFA, right TP trunk   PERIPHERAL VASCULAR ATHERECTOMY  11/25/2019   Procedure: PERIPHERAL VASCULAR ATHERECTOMY;  Surgeon: Iran Ouch, MD;  [facility];  Service: Cardiovascular;;  Lt.  POPLITEAL and AT   PERIPHERAL VASCULAR BALLOON ANGIOPLASTY Left 06/15/2019   Procedure: PERIPHERAL VASCULAR BALLOON ANGIOPLASTY;  Surgeon: Runell Gess, MD;  [facility];  Service: Cardiovascular;  Laterality: Left;  SFA UNSUCCESSFUL UNABLE TO CROSS LESION   PERIPHERAL VASCULAR BALLOON ANGIOPLASTY  08/03/2019   Procedure: PERIPHERAL VASCULAR BALLOON ANGIOPLASTY;  Surgeon: Runell Gess, MD;  [facility];  Service: Cardiovascular;;  right SFA, Right TP trunk   PERIPHERAL VASCULAR BALLOON ANGIOPLASTY  09/07/2021   Procedure: PERIPHERAL VASCULAR BALLOON ANGIOPLASTY;  Surgeon: Cephus Shelling, MD;  [facility];  Service: Cardiovascular;;   PERIPHERAL VASCULAR BALLOON ANGIOPLASTY Right  10/13/2021   Procedure: PERIPHERAL VASCULAR BALLOON ANGIOPLASTY;  Surgeon: Leonie Douglas, MD;  [facility];  Service: Cardiovascular;  Laterality: Right;   PERIPHERAL VASCULAR BALLOON ANGIOPLASTY  11/09/2022   Procedure: PERIPHERAL VASCULAR BALLOON ANGIOPLASTY;  Surgeon: Leonie Douglas, MD;  [facility];  Service: Cardiovascular;;  fem-pop bypass and AT   TEE WITHOUT CARDIOVERSION N/A 03/24/2020   Procedure: TRANSESOPHAGEAL ECHOCARDIOGRAM (TEE);  Surgeon: Linden Dolin, MD;  Location: [facility] OR;  Service: Open Heart Surgery;  Laterality: N/A;   TONSILLECTOMY     and adenoidectomy   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/2019   Procedure: TRANSMETATARSAL AMPUTATION;  Surgeon: Vivi Barrack, DPM;  [facility];  Service: Podiatry;  Laterality: Right;   TRANSMETATARSAL AMPUTATION Left 05/11/2021   Procedure: LEFT TRANSMETATARSAL AMPUTATION;  Surgeon: Maeola Harman, MD;  Location: [facility] OR;  Service: Vascular;  Laterality: Left;   TUBAL LIGATION      Family History  Problem Relation Age of Onset   Cancer - Prostate Father    Cancer - Colon Father    Stroke Mother    Hypertension Mother    Hyperlipidemia Mother    Melanoma Brother  Social History   Socioeconomic History   Marital status: Widowed    Spouse name: Not on file   Number of children: 3   Years of education: Not on file   Highest education level: Not on file  Occupational History   Not on file  Tobacco Use   Smoking status: Former    Current packs/day: 0.00    Types: Cigarettes    Quit date: 03/31/1972    Years since quitting: 51.0   Smokeless tobacco: Never  Vaping Use   Vaping status: Never Used  Substance and Sexual Activity   Alcohol use: No   Drug use: No   Sexual activity: Not on file  Other Topics Concern   Not on file  Social History Narrative   Not on file   Social Determinants of Health   Financial Resource Strain: Not on file  Food  Insecurity: No Food Insecurity (04/05/2023)   Hunger Vital Sign    Worried About Running Out of Food in the Last Year: Never true    Ran Out of Food in the Last Year: Never true  Transportation Needs: No Transportation Needs (04/05/2023)   PRAPARE - Administrator, Civil Service (Medical): No    Lack of Transportation (Non-Medical): No  Physical Activity: Not on file  Stress: Not on file  Social Connections: Unknown (02/02/2022)   Received from [facility]   Social Network    Social Network: Not on file  Intimate Partner Violence: Not At Risk (04/05/2023)   Humiliation, Afraid, Rape, and Kick questionnaire    Fear of Current or Ex-Partner: No    Emotionally Abused: No    Physically Abused: No    Sexually Abused: No    Allergies  Allergen Reactions   Versed [Midazolam] Anxiety    Frantic, out of my mind, agitated    Augmentin [Amoxicillin-Pot Clavulanate] Other (See Comments)    Elevates potassium level   Bactrim [Sulfamethoxazole-Trimethoprim] Other (See Comments)    ^ K+( elevated)    Demerol [Meperidine]     Delusional    Meperidine Hcl     Other reaction(s): delusional   Scopolamine     Delusional    Tramadol Other (See Comments)    Keeps awake    No current facility-administered medications for this visit.   No current outpatient medications on file.   Facility-Administered Medications Ordered in Other Visits  Medication Dose Route Frequency Provider Last Rate Last Admin   acetaminophen (TYLENOL) tablet 650 mg  650 mg Oral Q6H PRN Amin, Ankit Chirag, MD       brimonidine (ALPHAGAN) 0.2 % ophthalmic solution 1 drop  1 drop Right Eye BID Vida Rigger, MD   1 drop at 04/08/23 0930   ciprofloxacin (CIPRO) tablet 500 mg  500 mg Oral BID Pham, Minh Q, RPH-CPP   500 mg at 04/08/23 0927   dextrose 5 %-0.9 % sodium chloride infusion   Intravenous Continuous Amin, Ankit Chirag, MD 75 mL/hr at 04/08/23 1029 New Bag at 04/08/23 1029   guaiFENesin (ROBITUSSIN) 100  MG/5ML liquid 5 mL  5 mL Oral Q4H PRN Amin, Ankit Chirag, MD       hydrALAZINE (APRESOLINE) injection 10 mg  10 mg Intravenous Q4H PRN Amin, Ankit Chirag, MD       insulin aspart (novoLOG) injection 0-6 Units  0-6 Units Subcutaneous Q4H Vida Rigger, MD   1 Units at 04/08/23 0609   ipratropium-albuterol (DUONEB) 0.5-2.5 (3) MG/3ML nebulizer solution 3 mL  3  mL Nebulization Q4H PRN Amin, Ankit Chirag, MD       metoprolol tartrate (LOPRESSOR) injection 5 mg  5 mg Intravenous Q4H PRN Amin, Ankit Chirag, MD       metoprolol tartrate (LOPRESSOR) tablet 50 mg  50 mg Oral BID Vida Rigger, MD   50 mg at 04/08/23 0927   ondansetron (ZOFRAN) tablet 4 mg  4 mg Oral Q6H PRN Vida Rigger, MD       Or   ondansetron (ZOFRAN) injection 4 mg  4 mg Intravenous Q6H PRN Vida Rigger, MD       pantoprazole (PROTONIX) EC tablet 40 mg  40 mg Oral Daily Vida Rigger, MD   40 mg at 04/08/23 3474   senna-docusate (Senokot-S) tablet 1 tablet  1 tablet Oral QHS PRN Amin, Ankit Chirag, MD        PHYSICAL EXAM There were no vitals filed for this visit.     Constitutional: well appearing. no distress. Appears well nourished.  Neurologic: CN intact. no focal findings. no sensory loss. Psychiatric:  Mood and affect symmetric and appropriate. Eyes:  No icterus. No conjunctival pallor. Ears, nose, throat:  mucous membranes moist. Midline trachea.  Cardiac: regular rate and rhythm.  Respiratory:  unlabored. Abdominal:  soft, non-tender, non-distended.  Peripheral vascular: no palpable pedal pulses Extremity: no edema. no cyanosis. no pallor.  Lymphatic: no Stemmer's sign. n palpable lymphadenopathy.  PERTINENT LABORATORY AND RADIOLOGIC DATA  Most recent CBC    Latest Ref Rng & Units 04/08/2023    7:32 AM 04/07/2023    4:26 AM 04/06/2023    6:13 AM  CBC  WBC 4.0 - 10.5 K/uL 8.4  6.5  7.5   Hemoglobin 12.0 - 15.0 g/dL 9.4  8.9  9.5   Hematocrit 36.0 - 46.0 % 27.6  27.4  29.5   Platelets 150 - 400 K/uL 175  143  147       Most recent CMP    Latest Ref Rng & Units 04/08/2023    7:32 AM 04/07/2023    4:26 AM 04/06/2023    6:13 AM  CMP  Glucose 70 - 99 mg/dL 259  563  875   BUN 8 - 23 mg/dL 21  17  18    Creatinine 0.44 - 1.00 mg/dL 6.43  3.29  5.18   Sodium 135 - 145 mmol/L 137  138  139   Potassium 3.5 - 5.1 mmol/L 3.8  3.4  4.7   Chloride 98 - 111 mmol/L 111  113  113   CO2 22 - 32 mmol/L 19  17  19    Calcium 8.9 - 10.3 mg/dL 9.1  8.7  9.0   Total Protein 6.5 - 8.1 g/dL 5.3  5.0  5.5   Total Bilirubin 0.3 - 1.2 mg/dL 5.6  84.1  66.0   Alkaline Phos 38 - 126 U/L 498  540  587   AST 15 - 41 U/L 109  160  171   ALT 0 - 44 U/L 103  112  113     Renal function Estimated Creatinine Clearance: 26.8 mL/min (A) (by C-G formula based on SCr of 1.48 mg/dL (H)).  Hgb A1c MFr Bld (%)  Date Value  03/11/2023 7.9 (H)    LDL (calc)  Date Value Ref Range Status  04/06/2013 87 <100 mg/dL Final    Comment:    LDL-C is inaccurate if patient is nonfasting.   Reference Range: ---------------- Optimal:            <  100 Near/Above Optimal: 100-129 Borderline High:    130-159 High:               160-189 Very High:          >=190       LDL Chol Calc (NIH)  Date Value Ref Range Status  07/28/2020 54 0 - 99 mg/dL Final   LDL Cholesterol  Date Value Ref Range Status  10/14/2021 50 0 - 99 mg/dL Final    Comment:           Total Cholesterol/HDL:CHD Risk Coronary Heart Disease Risk Table                     Men   Women  1/2 Average Risk   3.4   3.3  Average Risk       5.0   4.4  2 X Average Risk   9.6   7.1  3 X Average Risk  23.4   11.0        Use the calculated Patient Ratio above and the CHD Risk Table to determine the patient's CHD Risk.        ATP III CLASSIFICATION (LDL):  <100     mg/dL   Optimal  409-811  mg/dL   Near or Above                    Optimal  130-159  mg/dL   Borderline  914-782  mg/dL   High  >956     mg/dL   Very High Performed at [facility], [address], [place], [place] [address]      +-------+-----------+-----------+------------+------------+  ABI/TBIToday's ABIToday's TBIPrevious ABIPrevious TBI  +-------+-----------+-----------+------------+------------+  Right 0.36       amp        Boone          amp           +-------+-----------+-----------+------------+------------+  Left  0.42       amp        0.75        amp           +-------+-----------+-----------+------------+------------+   Right: Patent graft with no stenosis.  Velocities less than 40 cm/s.     Left: Patent graft with stenosis at the proximal and distal anastomosis,  50-70% and >70% respecively.  50-74% stenosis at the outflow.    Rande Brunt. Lenell Antu, MD [facility] Phone Number: [phone] 04/08/2023 12:20 PM  Total time spent on preparing this encounter including chart review, data review, collecting history, examining the patient, coordinating care for this established patient, 30 minutes.  Portions of this report may have been transcribed using voice recognition software.  Every effort has been made to ensure accuracy; however, inadvertent computerized transcription errors may still be present.

## 2023-04-08 NOTE — Plan of Care (Signed)

## 2023-04-08 NOTE — Progress Notes (Signed)
1 Day Post-Op  Subjective: CC: S/p ERCP yesterday. No abdominal pain this am. Tolerated soft diet yesterday without n/v. BM's are not white but light brown. Last bm yesterday. Urine is becoming less dark. She is unsure if her jaundice has improved.   Objective: Vital signs in last 24 hours: Temp:  [97.5 F (36.4 C)-98.2 F (36.8 C)] 97.8 F (36.6 C) (07/15 0802) Pulse Rate:  [68-87] 86 (07/15 0802) Resp:  [10-18] 18 (07/15 0802) BP: (125-182)/(57-87) 182/69 (07/15 0802) SpO2:  [97 %-100 %] 100 % (07/15 0802) Weight:  [68.1 kg] 68.1 kg (07/15 0600) Last BM Date : 04/07/23  Intake/Output from previous day: 07/14 0701 - 07/15 0700 In: 1105.2 [P.O.:120; I.V.:585.2; IV Piggyback:400] Out: -  Intake/Output this shift: No intake/output data recorded.  PE: Gen:  Alert, NAD, pleasant Abd: Soft, ND, very mild ruq ttp. +BS Psych: A&Ox3  Skin: Jaundice   Lab Results:  Recent Labs    04/06/23 0613 04/07/23 0426  WBC 7.5 6.5  HGB 9.5* 8.9*  HCT 29.5* 27.4*  PLT 147* 143*   BMET Recent Labs    04/06/23 0613 04/07/23 0426  NA 139 138  K 4.7 3.4*  CL 113* 113*  CO2 19* 17*  GLUCOSE 144* 162*  BUN 18 17  CREATININE 1.73* 1.68*  CALCIUM 9.0 8.7*   PT/INR No results for input(s): "LABPROT", "INR" in the last 72 hours. CMP     Component Value Date/Time   NA 138 04/07/2023 0426   NA 142 07/29/2020 0942   K 3.4 (L) 04/07/2023 0426   CL 113 (H) 04/07/2023 0426   CO2 17 (L) 04/07/2023 0426   GLUCOSE 162 (H) 04/07/2023 0426   BUN 17 04/07/2023 0426   BUN 17 07/29/2020 0942   CREATININE 1.68 (H) 04/07/2023 0426   CREATININE 0.96 10/12/2021 1644   CALCIUM 8.7 (L) 04/07/2023 0426   PROT 5.0 (L) 04/07/2023 0426   PROT 6.3 07/28/2020 0945   ALBUMIN 2.0 (L) 04/07/2023 0426   ALBUMIN 4.2 07/28/2020 0945   AST 160 (H) 04/07/2023 0426   ALT 112 (H) 04/07/2023 0426   ALKPHOS 540 (H) 04/07/2023 0426   BILITOT 13.3 (H) 04/07/2023 0426   BILITOT 0.2 07/28/2020 0945    GFRNONAA 30 (L) 04/07/2023 0426   GFRAA 75 07/29/2020 0942   Lipase     Component Value Date/Time   LIPASE 43 04/05/2023 0205    Studies/Results: DG ERCP  Result Date: 04/07/2023 CLINICAL DATA:  ERCP with stone removal and biliary stent placement. History of cholecystectomy performed 10/12/2022. EXAM: ERCP TECHNIQUE: Multiple spot images obtained with the fluoroscopic device and submitted for interpretation post-procedure. FLUOROSCOPY TIME: FLUOROSCOPY TIME 5 minutes, 4 seconds (73.3 mGy) COMPARISON:  MRCP-04/05/2023; CT abdomen pelvis-04/04/2023 FINDINGS: Five spot intraoperative fluoroscopic images of the right upper abdominal quadrant during ERCP are provided for review Initial image demonstrates an ERCP probe overlying the right upper abdominal quadrant Subsequent images demonstrate selective cannulation and opacification of the common bile duct. Subsequent images demonstrate insufflation of a balloon within the mid aspect of the CBD with subsequent biliary sweeping and presumed sphincterotomy. Completion image demonstrates placement of a biliary stent within the CBD. There is minimal opacification of the intrahepatic biliary tree which appears nondilated. There is opacification of the cystic duct remnant. Contrast injection demonstrates opacification of the questioned lesion within segment V of the liver adjacent to the gallbladder fossa. There is no definitive opacification of the pancreatic duct. IMPRESSION: 1. ERCP with biliary sweeping and  stent placement as above. 2. 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 These images were submitted for radiologic interpretation only. Please see the procedural report for the amount of  contrast and the fluoroscopy time utilized. Electronically Signed   By: Simonne Come M.D.   On: 04/07/2023 11:33    Anti-infectives: Anti-infectives (From admission, onward)    Start     Dose/Rate Route Frequency Ordered Stop   04/08/23 0930  ciprofloxacin (CIPRO) tablet 500 mg        500 mg Oral 2 times daily 04/08/23 1610 04/10/23 0759   04/07/23 1230  ciprofloxacin (CIPRO) IVPB 400 mg  Status:  Discontinued        400 mg 200 mL/hr over 60 Minutes Intravenous Every 12 hours 04/07/23 1208 04/08/23 0837        Assessment/Plan 82 yo female approximately 6 months s/p partial fenestrating cholecystectomy, now with liver mass and jaundice.  - MRCP with signal loss at the common hepatic duct at the bifurcation, which appears separate from the solid/cystic mass. As stated by Dr. Freida Busman - this was interpreted as possible artifact from surgical clips on MRI, however no clips were placed during surgery as it was a partial cholecystectomy. Only the dome of the gallbladder was opened, so a stricture from a vascular injury is also extremely unlikely. She felt clinical presentation is highly concerning for a malignancy.  - S/p ERCP 7/14 w/ stricture at hepatic duct bifurcation s/p stent placement - cells for cytology obtained.  - Planned for IR bx of liver today.  - CMP pending to ensure LFT's are downtrending. Will f/u on this.  - We will follow with you. If LFT's are downtrending, we may wait for some of the results for ERCP cytology and liver bx to result before determining next steps. Will add on CA 19-9 to morning labs.   FEN - NPO for IR bx. Okay for diet after. IVF per [facility] VTE - SCDs, Xarelto on hold. Okay for heparin gtt or lovenox from our standpoint. Please check with GI and IR when they are okay with these.  ID - Cipro per GI.   I reviewed nursing notes, Consultant (GI) notes, hospitalist notes, last 24 h vitals and pain scores, last 48 h intake and output, last 24 h labs and trends, and last  24 h imaging results.    LOS: 4 days    Jacinto Halim , [facility] 04/08/2023, 8:39 AM Please see Amion for pager number during day hours 7:00am-4:30pm

## 2023-04-08 NOTE — Plan of Care (Signed)
Patient planned for liver lesion biopsy today in IR -- order was placed for NPO at midnight on 7/15 however per order review it appears this was inadvertently cancelled over the weekend.   As such, patient may have diet per primary team today and we will plan on liver lesion biopsy in IR tomorrow (7/16).  Plan: - NPO after midnight on 7/16, may have sips with medications. May take all ordered medications. - Hold anticoagulation until post procedure - IR will call for patient when ready - Consult/consent obtained on 7/12  - INR/CBC obtained this morning and are acceptable for procedure. No repeat labs needed tomorrow.  Please call IR with questions or concerns.  Lynnette Caffey, PA-C

## 2023-04-08 NOTE — Progress Notes (Signed)
[facility] Progress Note  SUBJECTIVE:   Interval history: [name] was seen and evaluated today at bedside. Resting comfortably in bed, no acute distress. Noted having some mild right sided abdominal pain. No nausea or vomiting. Tolerating diet. Had bowel movement, normal without blood. Pending liver biopsy, appears this was postponed to tomorrow. Liver enzymes improved. Pending pathology brushings from ERCP.  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 There is a small calcified mass in the L:A near the LA appendage. No valvular masses seen with associated mitral annular calcification. LA Volume/ BSA27.4 ml/m2 No AS. Right ventricular systolic pressure is elevated at .   Myocardial infarction [facility])    June 2021   Neuromuscular disorder (HCC)    neuropathy in bilateral feet   Peripheral vascular disease (HCC)    Pneumonia    not hosp.    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  PTCAs with drug coated balloon Lt ant tibial artery and Lt popliteal artery  11/26/2019   Past Surgical History:  Procedure Laterality Date   ABDOMINAL AORTAGRAM  11/25/2019   ABDOMINAL AORTOGRAM W/LOWER EXTREMITY    ABDOMINAL AORTOGRAM N/A 06/15/2019   Procedure: ABDOMINAL AORTOGRAM;  Surgeon: Runell Gess, MD;  [facility]: [facility];  Service: Cardiovascular;  Laterality: N/A;   ABDOMINAL AORTOGRAM W/LOWER EXTREMITY N/A 11/25/2019   Procedure:  ABDOMINAL AORTOGRAM W/LOWER EXTREMITY;  Surgeon: Iran Ouch, MD;  Location: [facility];  Service: Cardiovascular;  Laterality: N/A;  Lt leg   ABDOMINAL AORTOGRAM W/LOWER EXTREMITY N/A 08/01/2020   Procedure: ABDOMINAL AORTOGRAM W/LOWER EXTREMITY;  Surgeon: Runell Gess, MD;  [facility]: [facility];  Service: Cardiovascular;  Laterality: N/A;   ABDOMINAL AORTOGRAM W/LOWER EXTREMITY N/A 04/28/2021   Procedure: ABDOMINAL AORTOGRAM W/LOWER EXTREMITY;  Surgeon: Sherren Kerns, MD;  [facility]: [facility];  Service: Cardiovascular;  Laterality: N/A;   ABDOMINAL AORTOGRAM W/LOWER EXTREMITY N/A 10/13/2021   Procedure: ABDOMINAL AORTOGRAM W/LOWER EXTREMITY;  Surgeon: Leonie Douglas, MD;  [facility]: [facility];  Service: Cardiovascular;  Laterality: N/A;   ABDOMINAL AORTOGRAM W/LOWER EXTREMITY N/A 11/09/2022   Procedure: ABDOMINAL AORTOGRAM W/LOWER EXTREMITY;  Surgeon: Leonie Douglas, MD;  [facility];  Service: Cardiovascular;  Laterality: N/A;   ABDOMINAL AORTOGRAM W/LOWER EXTREMITY N/A 11/16/2022   Procedure: ABDOMINAL AORTOGRAM W/LOWER EXTREMITY;  Surgeon: Leonie Douglas, MD;  [facility];  Service: Cardiovascular;  Laterality: N/A;   AMPUTATION TOE Left 05/03/2021   Procedure: AMPUTATION OF THIRD LEFT  TOE;  Surgeon: Leonie Douglas, MD;  Location: [facility] OR;  Service: Vascular;  Laterality: Left;   APPENDECTOMY     APPLICATION OF WOUND VAC Right 07/21/2019   Procedure: Application Of Prevena Wound Vac Right Groin;  Surgeon: Nada Libman, MD;  [facility];  Service: Vascular;  Laterality: Right;   CARPAL TUNNEL RELEASE Left    CARPAL TUNNEL RELEASE Right    CESAREAN SECTION     x 2   CHOLECYSTECTOMY N/A 10/12/2022   Procedure: LAPAROSCOPIC PARTIAL FENESTRATING CHOLECYSTECTOMY;  Surgeon: Fritzi Mandes, MD;  [facility];  Service: General;  Laterality: N/A;   COLONOSCOPY     CORONARY ARTERY BYPASS GRAFT N/A  03/24/2020   Procedure: CORONARY ARTERY BYPASS GRAFTING (CABG) using LIMA to LAD (m); RIMA to RAMUS; Endoscopic Right Greater Saphenous Vein: SVG to Diag1; SVG to PLB (right); and SVG to PL (left).;  Surgeon: Linden Dolin, MD;  Location[facility];  Service: Open Heart Surgery;  Laterality: N/A;  BILATERAL IMA   ENDARTERECTOMY FEMORAL Right 07/21/2019   Procedure: RIGHT ENDARTERECTOMY FEMORAL WITH PATCH ANGIOPLASTY;  Surgeon: Nada Libman, MD;  [facility];  Service: Vascular;  Laterality: Right;   ENDARTERECTOMY FEMORAL Right 08/16/2020   Procedure: RIGHT FEMORAL ENDARTERECTOMY;  Surgeon: Maeola Harman, MD;  Location: [facility] OR;  Service: Vascular;  Laterality: Right;   ENDOVEIN HARVEST OF GREATER SAPHENOUS VEIN Right 03/24/2020   Procedure: ENDOVEIN HARVEST OF GREATER SAPHENOUS VEIN;  Surgeon: Linden Dolin, MD;  [facility];  Service: Open Heart Surgery;  Laterality: Right;   EYE SURGERY     cataract removal bilaterally   FEMORAL-POPLITEAL BYPASS GRAFT Right 08/16/2020   Procedure: BYPASS GRAFT FEMORAL-POPLITEAL ARTERY;  Surgeon: Maeola Harman, MD;  Location[facility] OR;  Service: Vascular;  Laterality: Right;   FEMORAL-POPLITEAL BYPASS GRAFT Left 05/03/2021   Procedure: LEFT FEMORAL TO BELOW KNEE POPLITEAL ARTERY BYPASS GRAFTING WITH 6MMX80 PTFE GRAFT;  Surgeon: Leonie Douglas, MD;  [facility];  Service: Vascular;  Laterality: Left;   LEFT HEART CATH AND CORONARY ANGIOGRAPHY N/A 03/22/2020   Procedure: LEFT HEART CATH AND CORONARY ANGIOGRAPHY;  Surgeon: Swaziland, Peter M, MD;  Location: [facility] INVASIVE CV LAB;  Service: Cardiovascular;  Laterality: N/A;   LOWER EXTREMITY ANGIOGRAPHY Bilateral 06/15/2019   Procedure: Lower Extremity Angiography;  Surgeon: Runell Gess, MD;  Location: [facility] INVASIVE CV LAB;  Service: Cardiovascular;  Laterality: Bilateral;   LOWER EXTREMITY ANGIOGRAPHY Right 08/03/2019   Procedure: LOWER EXTREMITY ANGIOGRAPHY;  Surgeon: Runell Gess, MD;  [facility];  Service: Cardiovascular;  Laterality: Right;   LOWER EXTREMITY ANGIOGRAPHY N/A 09/07/2021   Procedure: LOWER EXTREMITY ANGIOGRAPHY;  Surgeon: Cephus Shelling, MD;  [facility];  Service: Cardiovascular;  Laterality: N/A;   PATCH ANGIOPLASTY Right 07/21/2019   Procedure: Patch Angioplasty Right Femoral Artery;  Surgeon: Nada Libman, MD;  Location: [facility] OR;  Service: Vascular;  Laterality: Right;   PERIPHERAL INTRAVASCULAR LITHOTRIPSY  11/25/2019   Procedure: INTRAVASCULAR LITHOTRIPSY;  Surgeon: Iran Ouch, MD;  [facility];  Service: Cardiovascular;;  LT. SFA   PERIPHERAL VASCULAR ATHERECTOMY  08/03/2019   Procedure: PERIPHERAL VASCULAR ATHERECTOMY;  Surgeon: Runell Gess, MD;  Location: [facility] INVASIVE CV LAB;  Service: Cardiovascular;;  right SFA, right TP trunk   PERIPHERAL VASCULAR ATHERECTOMY  11/25/2019   Procedure: PERIPHERAL VASCULAR ATHERECTOMY;  Surgeon: Iran Ouch, MD;  [facility];  Service: Cardiovascular;;  Lt.  POPLITEAL and AT   PERIPHERAL VASCULAR BALLOON ANGIOPLASTY Left 06/15/2019   Procedure: PERIPHERAL VASCULAR BALLOON ANGIOPLASTY;  Surgeon: Runell Gess, MD;  [facility];  Service: Cardiovascular;  Laterality: Left;  SFA UNSUCCESSFUL UNABLE TO CROSS LESION  PERIPHERAL VASCULAR BALLOON ANGIOPLASTY  08/03/2019   Procedure: PERIPHERAL VASCULAR BALLOON ANGIOPLASTY;  Surgeon: Runell Gess, MD;  [facility];  Service: Cardiovascular;;  right SFA, Right TP trunk   PERIPHERAL VASCULAR BALLOON ANGIOPLASTY  09/07/2021   Procedure: PERIPHERAL VASCULAR BALLOON ANGIOPLASTY;  Surgeon: Cephus Shelling, MD;  [facility];  Service: Cardiovascular;;   PERIPHERAL VASCULAR BALLOON ANGIOPLASTY Right 10/13/2021   Procedure: PERIPHERAL VASCULAR BALLOON ANGIOPLASTY;  Surgeon: Leonie Douglas, MD;  [facility];  Service: Cardiovascular;  Laterality: Right;   PERIPHERAL VASCULAR BALLOON ANGIOPLASTY  11/09/2022   Procedure: PERIPHERAL VASCULAR BALLOON ANGIOPLASTY;  Surgeon: Leonie Douglas, MD;  [facility];  Service: Cardiovascular;;  fem-pop bypass and AT   TEE WITHOUT CARDIOVERSION N/A 03/24/2020   Procedure: TRANSESOPHAGEAL ECHOCARDIOGRAM (TEE);  Surgeon: Linden Dolin, MD;  Location: [facility] OR;  Service: Open Heart Surgery;  Laterality: N/A;   TONSILLECTOMY     and adenoidectomy   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/2019   Procedure: TRANSMETATARSAL AMPUTATION;  Surgeon: Vivi Barrack, DPM;  [facility] OR;  Service: Podiatry;  Laterality: Right;   TRANSMETATARSAL AMPUTATION Left 05/11/2021   Procedure: LEFT TRANSMETATARSAL AMPUTATION;  Surgeon: Maeola Harman, MD;  Location[facility] OR;  Service: Vascular;  Laterality: Left;   TUBAL LIGATION     Current Facility-Administered Medications  Medication Dose Route Frequency Provider Last Rate Last Admin   acetaminophen (TYLENOL) tablet 650 mg  650 mg Oral Q6H PRN Amin, Ankit Chirag, MD       brimonidine (ALPHAGAN) 0.2 % ophthalmic solution 1 drop  1 drop Right Eye BID Vida Rigger, MD   1 drop at 04/08/23 0930   ciprofloxacin (CIPRO) tablet 500 mg  500 mg Oral BID Pham, Minh Q, RPH-CPP   500 mg at 04/08/23 0927   dextrose 5 %-0.9 % sodium chloride infusion   Intravenous Continuous Amin, Ankit Chirag, MD 75 mL/hr at 04/08/23 1029 New Bag at 04/08/23 1029   guaiFENesin (ROBITUSSIN) 100 MG/5ML liquid 5 mL  5 mL Oral Q4H PRN Amin, Ankit Chirag, MD       hydrALAZINE (APRESOLINE) injection 10 mg  10 mg Intravenous Q4H PRN Amin, Ankit Chirag, MD       insulin aspart (novoLOG) injection 0-6 Units  0-6 Units Subcutaneous Q4H Vida Rigger, MD   2 Units at 04/08/23 1309   ipratropium-albuterol (DUONEB) 0.5-2.5 (3) MG/3ML nebulizer solution 3 mL  3 mL Nebulization Q4H PRN Amin, Ankit Chirag, MD       metoprolol  tartrate (LOPRESSOR) injection 5 mg  5 mg Intravenous Q4H PRN Amin, Ankit Chirag, MD       metoprolol tartrate (LOPRESSOR) tablet 50 mg  50 mg Oral BID Vida Rigger, MD   50 mg at 04/08/23 0927   ondansetron (ZOFRAN) tablet 4 mg  4 mg Oral Q6H PRN Vida Rigger, MD       Or   ondansetron (ZOFRAN) injection 4 mg  4 mg Intravenous Q6H PRN Vida Rigger, MD       pantoprazole (PROTONIX) EC tablet 40 mg  40 mg Oral Daily Vida Rigger, MD   40 mg at 04/08/23 2130   senna-docusate (Senokot-S) tablet 1 tablet  1 tablet Oral QHS PRN Dimple Nanas, MD       Allergies as of 04/04/2023 - Review Complete 04/04/2023  Allergen Reaction Noted   Versed [midazolam] Anxiety 08/06/2019   Augmentin [amoxicillin-pot clavulanate] Other (See Comments) 11/14/2021  Bactrim [sulfamethoxazole-trimethoprim] Other (See Comments) 08/06/2019   Demerol [meperidine]  03/30/2013   Meperidine hcl  01/29/2022   Scopolamine  05/12/2014   Tramadol Other (See Comments) 04/27/2021   Review of Systems:  Review of Systems  Respiratory:  Negative for shortness of breath.   Cardiovascular:  Negative for chest pain.  Gastrointestinal:  Positive for abdominal pain. Negative for blood in stool, nausea and vomiting.    OBJECTIVE:   Temp:  [97.6 F (36.4 C)-98.2 F (36.8 C)] 97.8 F (36.6 C) (07/15 0802) Pulse Rate:  [68-87] 70 (07/15 0927) Resp:  [15-18] 18 (07/15 0802) BP: (125-182)/(57-87) 170/68 (07/15 0927) SpO2:  [98 %-100 %] 100 % (07/15 0802) Weight:  [68.1 kg] 68.1 kg (07/15 0600) Last BM Date : 04/08/23 Physical Exam Constitutional:      General: She is not in acute distress.    Appearance: She is not ill-appearing, toxic-appearing or diaphoretic.  Cardiovascular:     Rate and Rhythm: Normal rate and regular rhythm.     Heart sounds: Murmur heard.  Pulmonary:     Effort: No respiratory distress.     Breath sounds: Normal breath sounds.  Abdominal:     General: Bowel sounds are normal. There is no distension.      Palpations: Abdomen is soft.     Tenderness: There is abdominal tenderness. There is no guarding.  Musculoskeletal:     Right lower leg: No edema.     Left lower leg: No edema.  Skin:    General: Skin is warm and dry.  Neurological:     Mental Status: She is alert.     Labs: Recent Labs    04/06/23 0613 04/07/23 0426 04/08/23 0732  WBC 7.5 6.5 8.4  HGB 9.5* 8.9* 9.4*  HCT 29.5* 27.4* 27.6*  PLT 147* 143* 175   BMET Recent Labs    04/06/23 0613 04/07/23 0426 04/08/23 0732  NA 139 138 137  K 4.7 3.4* 3.8  CL 113* 113* 111  CO2 19* 17* 19*  GLUCOSE 144* 162* 158*  BUN 18 17 21   CREATININE 1.73* 1.68* 1.48*  CALCIUM 9.0 8.7* 9.1   LFT Recent Labs    04/08/23 0732  PROT 5.3*  ALBUMIN 2.0*  AST 109*  ALT 103*  ALKPHOS 498*  BILITOT 5.6*   PT/INR Recent Labs    04/08/23 0732  LABPROT 15.6*  INR 1.2   Diagnostic imaging: DG ERCP  Result Date: 04/07/2023 CLINICAL DATA:  ERCP with stone removal and biliary stent placement. History of cholecystectomy performed 10/12/2022. EXAM: ERCP TECHNIQUE: Multiple spot images obtained with the fluoroscopic device and submitted for interpretation post-procedure. FLUOROSCOPY TIME: FLUOROSCOPY TIME 5 minutes, 4 seconds (73.3 mGy) COMPARISON:  MRCP-04/05/2023; CT abdomen pelvis-04/04/2023 FINDINGS: Five spot intraoperative fluoroscopic images of the right upper abdominal quadrant during ERCP are provided for review Initial image demonstrates an ERCP probe overlying the right upper abdominal quadrant Subsequent images demonstrate selective cannulation and opacification of the common bile duct. Subsequent images demonstrate insufflation of a balloon within the mid aspect of the CBD with subsequent biliary sweeping and presumed sphincterotomy. Completion image demonstrates placement of a biliary stent within the CBD. There is minimal opacification of the intrahepatic biliary tree which appears nondilated. There is opacification of  the cystic duct remnant. Contrast injection demonstrates opacification of the questioned lesion within segment V of the liver adjacent to the gallbladder fossa. There is no definitive opacification of the pancreatic duct. IMPRESSION: 1. ERCP with biliary sweeping and stent  placement as above. 2. 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 These images were submitted for radiologic interpretation only. Please see the procedural report for the amount of contrast and the fluoroscopy time utilized. Electronically Signed   By: Simonne Come M.D.   On: 04/07/2023 11:33    IMPRESSION: Jaundice Hyperbilirubinemia Abnormal MRI imaging, concern for mass like lesion in gallbladder fossa  Status post fenestrating cholecystectomy on 10/12/2022 Coronary artery disease status post stent in March 2021 Diabetes mellitus  PLAN: -Trend liver enzyme panel -Follow up pathology results from biliary brushing -Follow up liver biopsy results -Appreciate General Surgery recommendations  -[facility] will follow   LOS: 4 days   Liliane Shi, [facility]

## 2023-04-08 NOTE — Progress Notes (Signed)
PROGRESS NOTE    [name]  WUJ:[id number] DOB: [birth year] DOA: 04/04/2023 PCP: Shon Hale, MD   Brief Narrative:  82 year old with history of HTN, DM2, CAD, PAD, cholecystectomy in January of this year comes to the ED with painless jaundice.  She was noted to have transaminitis with total bilirubin of 13.1.  CT abdomen pelvis showed hypodense mass in the gallbladder fossa.  MRCP on 7/12 showed increasingly aggressive masslike appearing lesion in the liver adjacent to gallbladder fossa concerning for neoplasm.  Underwent ERCP on 7/14 and planned liver biopsy on 7/15.   Assessment & Plan:  Principal Problem:   Jaundice Active Problems:   DM2 (diabetes mellitus, type 2) (HCC)   Carotid artery disease (HCC)   PAD (peripheral artery disease) (HCC)   Acute renal failure superimposed on stage 3b chronic kidney disease (HCC)   Coronary artery disease involving native heart without angina pectoris     Abnormal LFTs/transaminitis Previous partial fenestrating cholecystectomy by Dr. Freida Busman 10/12/2022: Patient underwent CT abdomen pelvis and MRCP concerning for liver mass.  ERCP performed by GI on 7/14 and plan liver biopsy by IR today 7/15.  1 temporary plastic biliary stent was placed in CBD - Xarelto is on hold -On empiric Cipro until 7/17.  GI   AKI on CKD 3b Metabolic acidosis bicarb 18-17:  Baseline creatinine 1.2, currently 1.68.  Continue gentle hydration.  Holding nephrotoxic drugs including HCTZ, ARB, Jardiance and metformin   Hypokalemia Hypomagnesemia: As needed repletion   CAD:  No chest pain   PAD:  Currently Xarelto is on hold   Hypertension:  Hold nephrotoxic drugs.  IV as needed   Type II DM with hyperglycemia:  hba1c was 7.9% in June 2024, sliding scale and Accu-Cheks   Normocytic anemia: hemoglobin slightly downtrending, continue to monitor anemia panel shows stable iron B12 and folate.   DVT prophylaxis: SCDs Start: 04/05/23 0030.SQ Heparin  until 7/13 night Code Status:   Code Status: Full Code Family Communication: plan of care discussed with patient and daughters at bedside. Patient status is: Inpatient because of abnormal LFTs Level of care: Med-Surg    Dispo: The patient is from: home            Anticipated disposition: Pending IR liver biopsy      Diet Orders (From admission, onward)     Start     Ordered   04/07/23 1719  DIET SOFT Room service appropriate? Yes; Fluid consistency: Thin  Diet effective now       Question Answer Comment  Room service appropriate? Yes   Fluid consistency: Thin      04/07/23 1719            Subjective:  Doing ok no complaints, ready for her liver biopsy  Examination:  General exam: Appears calm and comfortable  Respiratory system: Clear to auscultation. Respiratory effort normal. Cardiovascular system: S1 & S2 heard, RRR. No JVD, murmurs, rubs, gallops or clicks. No pedal edema. Gastrointestinal system: Abdomen is nondistended, soft and nontender. No organomegaly or masses felt. Normal bowel sounds heard. Central nervous system: Alert and oriented. No focal neurological deficits. Extremities: Symmetric 5 x 5 power. Skin:+ jaundiced Psychiatry: Judgement and insight appear normal. Mood & affect appropriate.  Objective: Vitals:   04/07/23 1630 04/07/23 2111 04/08/23 0600 04/08/23 0621  BP: (!) 166/57 (!) 157/61 (!) 152/63 125/87  Pulse: 69 87 68   Resp: 17 15 16 16   Temp: 97.6 F (36.4 C) 98.2 F (36.8 C)  98 F (36.7 C) 97.7 F (36.5 C)  TempSrc:  Oral Oral Oral  SpO2: 100% 98% 100% 100%  Weight:   68.1 kg   Height:        Intake/Output Summary (Last 24 hours) at 04/08/2023 0746 Last data filed at 04/08/2023 0414 Gross per 24 hour  Intake 920 ml  Output --  Net 920 ml   Filed Weights   04/06/23 0448 04/07/23 0615 04/08/23 0600  Weight: 66.2 kg 67.3 kg 68.1 kg    Scheduled Meds:  brimonidine  1 drop Right Eye BID   insulin aspart  0-6 Units  Subcutaneous Q4H   metoprolol tartrate  50 mg Oral BID   pantoprazole  40 mg Oral Daily   Continuous Infusions:  ciprofloxacin Stopped (04/07/23 2335)   dextrose 5 % and 0.9 % NaCl Stopped (04/07/23 1610)    Nutritional status     Body mass index is 24.98 kg/m.  Data Reviewed:   CBC: Recent Labs  Lab 04/04/23 2138 04/05/23 0205 04/06/23 0613 04/07/23 0426  WBC 11.0* 9.9 7.5 6.5  NEUTROABS 7.8*  --   --   --   HGB 11.3* 9.6* 9.5* 8.9*  HCT 34.9* 28.7* 29.5* 27.4*  MCV 86.6 85.4 87.8 86.7  PLT 210 171 147* 143*   Basic Metabolic Panel: Recent Labs  Lab 04/04/23 2138 04/05/23 0205 04/05/23 1430 04/06/23 0613 04/07/23 0426  NA 137 138 141 139 138  K 3.2* 2.9* 4.6 4.7 3.4*  CL 104 110 114* 113* 113*  CO2 18* 17* 19* 19* 17*  GLUCOSE 159* 60* 86 144* 162*  BUN 27* 25* 20 18 17   CREATININE 1.87* 1.75* 1.62* 1.73* 1.68*  CALCIUM 9.1 8.4* 9.2 9.0 8.7*  MG  --  1.4*  --  2.2  --    GFR: Estimated Creatinine Clearance: 23.6 mL/min (A) (by C-G formula based on SCr of 1.68 mg/dL (H)). Liver Function Tests: Recent Labs  Lab 04/04/23 2138 04/05/23 0205 04/06/23 0613 04/07/23 0426  AST 185* 163* 171* 160*  ALT 137* 115* 113* 112*  ALKPHOS 646* 550* 587* 540*  BILITOT 13.1* 11.1* 13.1* 13.3*  PROT 7.1 5.7* 5.5* 5.0*  ALBUMIN 3.0* 2.5* 2.2* 2.0*   Recent Labs  Lab 04/05/23 0205  LIPASE 43   No results for input(s): "AMMONIA" in the last 168 hours. Coagulation Profile: Recent Labs  Lab 04/05/23 0205  INR 1.7*   Cardiac Enzymes: No results for input(s): "CKTOTAL", "CKMB", "CKMBINDEX", "TROPONINI" in the last 168 hours. BNP (last 3 results) No results for input(s): "PROBNP" in the last 8760 hours. HbA1C: No results for input(s): "HGBA1C" in the last 72 hours. CBG: Recent Labs  Lab 04/07/23 1127 04/07/23 1701 04/07/23 2220 04/08/23 0036 04/08/23 0559  GLUCAP 174* 206* 322* 291* 161*   Lipid Profile: No results for input(s): "CHOL", "HDL",  "LDLCALC", "TRIG", "CHOLHDL", "LDLDIRECT" in the last 72 hours. Thyroid Function Tests: No results for input(s): "TSH", "T4TOTAL", "FREET4", "T3FREE", "THYROIDAB" in the last 72 hours. Anemia Panel: Recent Labs    04/06/23 0613  VITAMINB12 1,567*  FOLATE 7.9  FERRITIN 199  TIBC 287  IRON 61  RETICCTPCT 1.2   Sepsis Labs: No results for input(s): "PROCALCITON", "LATICACIDVEN" in the last 168 hours.  No results found for this or any previous visit (from the past 240 hour(s)).       Radiology Studies: DG ERCP  Result Date: 04/07/2023 CLINICAL DATA:  ERCP with stone removal and biliary stent placement. History of cholecystectomy performed  10/12/2022. EXAM: ERCP TECHNIQUE: Multiple spot images obtained with the fluoroscopic device and submitted for interpretation post-procedure. FLUOROSCOPY TIME: FLUOROSCOPY TIME 5 minutes, 4 seconds (73.3 mGy) COMPARISON:  MRCP-04/05/2023; CT abdomen pelvis-04/04/2023 FINDINGS: Five spot intraoperative fluoroscopic images of the right upper abdominal quadrant during ERCP are provided for review Initial image demonstrates an ERCP probe overlying the right upper abdominal quadrant Subsequent images demonstrate selective cannulation and opacification of the common bile duct. Subsequent images demonstrate insufflation of a balloon within the mid aspect of the CBD with subsequent biliary sweeping and presumed sphincterotomy. Completion image demonstrates placement of a biliary stent within the CBD. There is minimal opacification of the intrahepatic biliary tree which appears nondilated. There is opacification of the cystic duct remnant. Contrast injection demonstrates opacification of the questioned lesion within segment V of the liver adjacent to the gallbladder fossa. There is no definitive opacification of the pancreatic duct. IMPRESSION: 1. ERCP with biliary sweeping and stent placement as above. 2. 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 These images were submitted for radiologic interpretation only. Please see the procedural report for the amount of contrast and the fluoroscopy time utilized. Electronically Signed   By: Simonne Come M.D.   On: 04/07/2023 11:33           LOS: 4 days   Time spent= 35 mins    Devin Foskey Joline Maxcy, MD [facility]  If 7PM-7AM, please contact night-coverage  04/08/2023, 7:46 AM

## 2023-04-08 NOTE — Inpatient Diabetes Management (Signed)
Inpatient Diabetes Program Recommendations  AACE/ADA: New Consensus Statement on Inpatient Glycemic Control (2015)  Target Ranges:  Prepandial:   less than 140 mg/dL      Peak postprandial:   less than 180 mg/dL (1-2 hours)      Critically ill patients:  140 - 180 mg/dL   Lab Results  Component Value Date   GLUCAP 137 (H) 04/08/2023   HGBA1C 7.9 (H) 03/11/2023    Review of Glycemic Control  Latest Reference Range & Units 04/07/23 11:27 04/07/23 17:01 04/07/23 22:20 04/08/23 00:36 04/08/23 05:59 04/08/23 08:02  Glucose-Capillary 70 - 99 mg/dL 161 (H) 096 (H) 045 (H) 291 (H) 161 (H) 137 (H)  Diabetes history: DM2 Outpatient Diabetes medications: 75/25 15 units q am, Prandin 2 mg bid, Jardiance 25 mg every day, Metformin 1 gm bid Current orders for Inpatient glycemic control: Novolog 0-6 units q 4 hrs.   Inpatient Diabetes Program Recommendations:    Note patient did receive Decadron 4 mg yesterday morning which likely increased blood sugars.  May consider adding low dose basal insulin.  Consider adding Semglee 6 units daily.  Once eating consistently, may consider changing Novolog correction to tid with meals and HS.    Thanks,  Beryl Meager, RN, BC-ADM Inpatient Diabetes Coordinator Pager [phone]  (8a-5p)

## 2023-04-09 ENCOUNTER — Ambulatory Visit (HOSPITAL_COMMUNITY): Payer: Medicare Other

## 2023-04-09 ENCOUNTER — Ambulatory Visit: Payer: Medicare Other | Admitting: Vascular Surgery

## 2023-04-09 ENCOUNTER — Inpatient Hospital Stay (HOSPITAL_COMMUNITY): Payer: Medicare Other

## 2023-04-09 ENCOUNTER — Other Ambulatory Visit (HOSPITAL_COMMUNITY): Payer: Self-pay

## 2023-04-09 ENCOUNTER — Encounter (HOSPITAL_COMMUNITY): Payer: Self-pay | Admitting: Family Medicine

## 2023-04-09 DIAGNOSIS — R17 Unspecified jaundice: Secondary | ICD-10-CM | POA: Diagnosis not present

## 2023-04-09 LAB — COMPREHENSIVE METABOLIC PANEL
ALT: 89 U/L — ABNORMAL HIGH (ref 0–44)
AST: 81 U/L — ABNORMAL HIGH (ref 15–41)
Albumin: 2.2 g/dL — ABNORMAL LOW (ref 3.5–5.0)
Alkaline Phosphatase: 489 U/L — ABNORMAL HIGH (ref 38–126)
Anion gap: 7 (ref 5–15)
BUN: 18 mg/dL (ref 8–23)
CO2: 20 mmol/L — ABNORMAL LOW (ref 22–32)
Calcium: 9.1 mg/dL (ref 8.9–10.3)
Chloride: 112 mmol/L — ABNORMAL HIGH (ref 98–111)
Creatinine, Ser: 1.37 mg/dL — ABNORMAL HIGH (ref 0.44–1.00)
GFR, Estimated: 39 mL/min — ABNORMAL LOW (ref >60)
Glucose, Bld: 116 mg/dL — ABNORMAL HIGH (ref 70–99)
Potassium: 3.5 mmol/L (ref 3.5–5.1)
Sodium: 139 mmol/L (ref 135–145)
Total Bilirubin: 3.6 mg/dL — ABNORMAL HIGH (ref 0.3–1.2)
Total Protein: 5.2 g/dL — ABNORMAL LOW (ref 6.5–8.1)

## 2023-04-09 LAB — GLUCOSE, CAPILLARY
Glucose-Capillary: 105 mg/dL — ABNORMAL HIGH (ref 70–99)
Glucose-Capillary: 157 mg/dL — ABNORMAL HIGH (ref 70–99)
Glucose-Capillary: 90 mg/dL (ref 70–99)

## 2023-04-09 LAB — CBC
HCT: 26.5 % — ABNORMAL LOW (ref 36.0–46.0)
Hemoglobin: 9 g/dL — ABNORMAL LOW (ref 12.0–15.0)
MCH: 29.4 pg (ref 26.0–34.0)
MCHC: 34 g/dL (ref 30.0–36.0)
MCV: 86.6 fL (ref 80.0–100.0)
Platelets: 174 10*3/uL (ref 150–400)
RBC: 3.06 MIL/uL — ABNORMAL LOW (ref 3.87–5.11)
RDW: 18.7 % — ABNORMAL HIGH (ref 11.5–15.5)
WBC: 9.3 10*3/uL (ref 4.0–10.5)
nRBC: 0 % (ref 0.0–0.2)

## 2023-04-09 LAB — MAGNESIUM: Magnesium: 1.4 mg/dL — ABNORMAL LOW (ref 1.7–2.4)

## 2023-04-09 LAB — CYTOLOGY - NON PAP

## 2023-04-09 MED ORDER — METOPROLOL TARTRATE 50 MG PO TABS
50.0000 mg | ORAL_TABLET | Freq: Two times a day (BID) | ORAL | 0 refills | Status: DC
  Filled 2023-04-09: qty 60, 30d supply, fill #0

## 2023-04-09 MED ORDER — CIPROFLOXACIN HCL 500 MG PO TABS
500.0000 mg | ORAL_TABLET | Freq: Two times a day (BID) | ORAL | 0 refills | Status: AC
  Filled 2023-04-09: qty 4, 2d supply, fill #0

## 2023-04-09 MED ORDER — POTASSIUM CHLORIDE 10 MEQ/100ML IV SOLN
10.0000 meq | INTRAVENOUS | Status: AC
  Administered 2023-04-09: 10 meq via INTRAVENOUS
  Filled 2023-04-09 (×2): qty 100

## 2023-04-09 MED ORDER — MAGNESIUM OXIDE -MG SUPPLEMENT 400 (240 MG) MG PO TABS: 800.0000 mg | ORAL_TABLET | Freq: Once | ORAL | Status: DC

## 2023-04-09 MED ORDER — POTASSIUM CHLORIDE CRYS ER 20 MEQ PO TBCR: 40.0000 meq | EXTENDED_RELEASE_TABLET | Freq: Once | ORAL | Status: DC

## 2023-04-09 MED ORDER — FENTANYL CITRATE (PF) 100 MCG/2ML IJ SOLN
INTRAMUSCULAR | Status: AC | PRN
  Administered 2023-04-09 (×2): 50 ug via INTRAVENOUS

## 2023-04-09 MED ORDER — MAGNESIUM SULFATE 4 GM/100ML IV SOLN
4.0000 g | Freq: Once | INTRAVENOUS | Status: DC
  Administered 2023-04-09: 4 g via INTRAVENOUS
  Filled 2023-04-09: qty 100

## 2023-04-09 MED ORDER — FENTANYL CITRATE (PF) 100 MCG/2ML IJ SOLN
INTRAMUSCULAR | Status: AC
  Filled 2023-04-09: qty 2

## 2023-04-09 MED ORDER — LIDOCAINE HCL (PF) 1 % IJ SOLN
3.0000 mL | Freq: Once | INTRAMUSCULAR | Status: AC
  Administered 2023-04-09: 3 mL via INTRADERMAL

## 2023-04-09 NOTE — Progress Notes (Signed)
PT Cancellation Note  Patient Details Name: [name] MRN: [id number] DOB: 1941/04/04   Cancelled Treatment:    Reason Eval/Treat Not Completed: Patient at procedure or test/unavailable   [name] [name] 04/09/2023, 10:46 AM Merryl Hacker, PT Acute Rehabilitation Services Office: [phone]

## 2023-04-09 NOTE — Progress Notes (Signed)
PROGRESS NOTE    [name]  VOZ:[id number] DOB: 04-Dec-1940 DOA: 04/04/2023 PCP: Shon Hale, MD   Brief Narrative:  82 year old with history of HTN, DM2, CAD, PAD, cholecystectomy in January of this year comes to the ED with painless jaundice.  She was noted to have transaminitis with total bilirubin of 13.1.  CT abdomen pelvis showed hypodense mass in the gallbladder fossa.  MRCP on 7/12 showed increasingly aggressive masslike appearing lesion in the liver adjacent to gallbladder fossa concerning for neoplasm.  Underwent ERCP on 7/14 and liver biopsy today   Assessment & Plan:  Principal Problem:   Jaundice Active Problems:   DM2 (diabetes mellitus, type 2) (HCC)   Carotid artery disease (HCC)   PAD (peripheral artery disease) (HCC)   Acute renal failure superimposed on stage 3b chronic kidney disease (HCC)   Coronary artery disease involving native heart without angina pectoris     Abnormal LFTs/transaminitis Previous partial fenestrating cholecystectomy by Dr. Freida Busman 10/12/2022: Patient underwent CT abdomen pelvis and MRCP concerning for liver mass.  ERCP performed by GI on 7/14 and liver biopsy today.  1 temporary plastic biliary stent was placed in CBD - Xarelto is on hold, resume when cleared by IR -On empiric Cipro until 7/17.  GI   AKI on CKD 3b Metabolic acidosis bicarb 18-17:  Baseline creatinine 1.2, currently 1.68.  Continue gentle hydration.  Holding nephrotoxic drugs including HCTZ, ARB, Jardiance and metformin   Hypokalemia Hypomagnesemia: As needed repletion   CAD:  No chest pain   PAD:  Currently Xarelto is on hold   Hypertension:  Hold nephrotoxic drugs.  IV as needed   Type II DM with hyperglycemia:  hba1c was 7.9% in June 2024, sliding scale and Accu-Cheks   Normocytic anemia: hemoglobin slightly downtrending, continue to monitor anemia panel shows stable iron B12 and folate.   DVT prophylaxis: SCDs Start: 04/05/23 0030.SQ Heparin  until 7/13 night Code Status:   Code Status: Full Code Family Communication: plan of care discussed with patient and daughters at bedside. Patient status is: Inpatient because of abnormal LFTs Level of care: Med-Surg    Dispo: The patient is from: home            Anticipated disposition: Liver biopsy today.  Thereafter PT/OT      Diet Orders (From admission, onward)     Start     Ordered   04/09/23 0001  Diet NPO time specified Except for: Sips with Meds  Diet effective midnight       Comments: For IR procedure  Question:  Except for  Answer:  Clearance Coots with Meds   04/08/23 1331            Subjective:  Seen right before her liver biopsy.  No complaints  Examination:  General exam: Appears calm and comfortable  Respiratory system: Clear to auscultation. Respiratory effort normal. Cardiovascular system: S1 & S2 heard, RRR. No JVD, murmurs, rubs, gallops or clicks. No pedal edema. Gastrointestinal system: Abdomen is nondistended, soft and nontender. No organomegaly or masses felt. Normal bowel sounds heard. Central nervous system: Alert and oriented. No focal neurological deficits. Extremities: Symmetric 5 x 5 power. Skin: Mild jaundice with scleral icterus Psychiatry: Judgement and insight appear normal. Mood & affect appropriate.  Objective: Vitals:   04/09/23 0935 04/09/23 0955 04/09/23 1000 04/09/23 1006  BP: (!) 192/67 (!) 196/64 (!) 179/55 (!) 187/70  Pulse: 100 74 76 78  Resp: (!) 8 (!) 9 13 13   Temp:  TempSrc:      SpO2: 100% 100% 99% 99%  Weight:      Height:       No intake or output data in the 24 hours ending 04/09/23 1007  Filed Weights   04/06/23 0448 04/07/23 0615 04/08/23 0600  Weight: 66.2 kg 67.3 kg 68.1 kg    Scheduled Meds:  brimonidine  1 drop Right Eye BID   ciprofloxacin  500 mg Oral BID   insulin aspart  0-6 Units Subcutaneous Q4H   metoprolol tartrate  50 mg Oral BID   pantoprazole  40 mg Oral Daily   Continuous Infusions:   magnesium sulfate bolus IVPB     potassium chloride      Nutritional status     Body mass index is 24.98 kg/m.  Data Reviewed:   CBC: Recent Labs  Lab 04/04/23 2138 04/05/23 0205 04/06/23 9485 04/07/23 0426 04/08/23 0732 04/09/23 0301  WBC 11.0* 9.9 7.5 6.5 8.4 9.3  NEUTROABS 7.8*  --   --   --   --   --   HGB 11.3* 9.6* 9.5* 8.9* 9.4* 9.0*  HCT 34.9* 28.7* 29.5* 27.4* 27.6* 26.5*  MCV 86.6 85.4 87.8 86.7 83.9 86.6  PLT 210 171 147* 143* 175 174   Basic Metabolic Panel: Recent Labs  Lab 04/05/23 0205 04/05/23 1430 04/06/23 0613 04/07/23 0426 04/08/23 0732 04/09/23 0301  NA 138 141 139 138 137 139  K 2.9* 4.6 4.7 3.4* 3.8 3.5  CL 110 114* 113* 113* 111 112*  CO2 17* 19* 19* 17* 19* 20*  GLUCOSE 60* 86 144* 162* 158* 116*  BUN 25* 20 18 17 21 18   CREATININE 1.75* 1.62* 1.73* 1.68* 1.48* 1.37*  CALCIUM 8.4* 9.2 9.0 8.7* 9.1 9.1  MG 1.4*  --  2.2  --   --  1.4*   GFR: Estimated Creatinine Clearance: 29 mL/min (A) (by C-G formula based on SCr of 1.37 mg/dL (H)). Liver Function Tests: Recent Labs  Lab 04/05/23 0205 04/06/23 0613 04/07/23 0426 04/08/23 0732 04/09/23 0301  AST 163* 171* 160* 109* 81*  ALT 115* 113* 112* 103* 89*  ALKPHOS 550* 587* 540* 498* 489*  BILITOT 11.1* 13.1* 13.3* 5.6* 3.6*  PROT 5.7* 5.5* 5.0* 5.3* 5.2*  ALBUMIN 2.5* 2.2* 2.0* 2.0* 2.2*   Recent Labs  Lab 04/05/23 0205  LIPASE 43   No results for input(s): "AMMONIA" in the last 168 hours. Coagulation Profile: Recent Labs  Lab 04/05/23 0205 04/08/23 0732  INR 1.7* 1.2   Cardiac Enzymes: No results for input(s): "CKTOTAL", "CKMB", "CKMBINDEX", "TROPONINI" in the last 168 hours. BNP (last 3 results) No results for input(s): "PROBNP" in the last 8760 hours. HbA1C: No results for input(s): "HGBA1C" in the last 72 hours. CBG: Recent Labs  Lab 04/08/23 1236 04/08/23 1558 04/08/23 2046 04/08/23 2358 04/09/23 0748  GLUCAP 229* 238* 232* 157* 90   Lipid Profile: No  results for input(s): "CHOL", "HDL", "LDLCALC", "TRIG", "CHOLHDL", "LDLDIRECT" in the last 72 hours. Thyroid Function Tests: No results for input(s): "TSH", "T4TOTAL", "FREET4", "T3FREE", "THYROIDAB" in the last 72 hours. Anemia Panel: No results for input(s): "VITAMINB12", "FOLATE", "FERRITIN", "TIBC", "IRON", "RETICCTPCT" in the last 72 hours.  Sepsis Labs: No results for input(s): "PROCALCITON", "LATICACIDVEN" in the last 168 hours.  No results found for this or any previous visit (from the past 240 hour(s)).       Radiology Studies: DG ERCP  Result Date: 04/07/2023 CLINICAL DATA:  ERCP with stone removal and biliary  stent placement. History of cholecystectomy performed 10/12/2022. EXAM: ERCP TECHNIQUE: Multiple spot images obtained with the fluoroscopic device and submitted for interpretation post-procedure. FLUOROSCOPY TIME: FLUOROSCOPY TIME 5 minutes, 4 seconds (73.3 mGy) COMPARISON:  MRCP-04/05/2023; CT abdomen pelvis-04/04/2023 FINDINGS: Five spot intraoperative fluoroscopic images of the right upper abdominal quadrant during ERCP are provided for review Initial image demonstrates an ERCP probe overlying the right upper abdominal quadrant Subsequent images demonstrate selective cannulation and opacification of the common bile duct. Subsequent images demonstrate insufflation of a balloon within the mid aspect of the CBD with subsequent biliary sweeping and presumed sphincterotomy. Completion image demonstrates placement of a biliary stent within the CBD. There is minimal opacification of the intrahepatic biliary tree which appears nondilated. There is opacification of the cystic duct remnant. Contrast injection demonstrates opacification of the questioned lesion within segment V of the liver adjacent to the gallbladder fossa. There is no definitive opacification of the pancreatic duct. IMPRESSION: 1. ERCP with biliary sweeping and stent placement as above. 2. 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 These images were submitted for radiologic interpretation only. Please see the procedural report for the amount of contrast and the fluoroscopy time utilized. Electronically Signed   By: Simonne Come M.D.   On: 04/07/2023 11:33           LOS: 5 days   Time spent= 35 mins    Abdulrahman Bracey Joline Maxcy, MD [facility]  If 7PM-7AM, please contact night-coverage  04/09/2023, 10:07 AM

## 2023-04-09 NOTE — Discharge Summary (Signed)
Physician Discharge Summary  [name] CORNACCHIA OZH:[id number] DOB: June 05, 1941 DOA: 04/04/2023  PCP: Shon Hale, MD  Admit date: 04/04/2023 Discharge date: 04/09/2023  Admitted From: Home Disposition: Home  Recommendations for Outpatient Follow-up:  Follow up with PCP in 1-2 weeks Please obtain CMP/CBC in one week your next doctors visit.  Please outpatient follow-up with [facility] gastroenterology, Dr. Ewing Schlein.  Will be arranged by their service Discussed with Dr Pamelia Hoit and Send Amb Onc referral via EMR to ensure follow-up once final ERCP brushing and liver biopsy results have been finalized 2 days of oral Cipro Metoprolol changed to 50 mg twice daily Okay to resume Xarelto   Discharge Condition: Stable CODE STATUS: Full code Diet recommendation: Heart healthy  Brief/Interim Summary:  82 year old with history of HTN, DM2, CAD, PAD, cholecystectomy in January of this year comes to the ED with painless jaundice.  She was noted to have transaminitis with total bilirubin of 13.1.  CT abdomen pelvis showed hypodense mass in the gallbladder fossa.  MRCP on 7/12 showed increasingly aggressive masslike appearing lesion in the liver adjacent to gallbladder fossa concerning for neoplasm.  Underwent ERCP on 7/14 and liver biopsy today.  Patient tolerated liver biopsy well.  She was cleared by GI with outpatient follow-up.  Prelim ERCP brushing results were positive for concerns of tumor cells.  [facility] will follow-up outpatient once these results have been finalized.  Also spoke with Dr. Pamelia Hoit from oncology and ambulatory oncology referral sent so appropriate oncologist can follow-up with the patient once we have brushings result and liver biopsy results.  Patient feels medically stable and would like to be discharged today.  She is ambulating well without any issues.  I called patient's daughter but no answer therefore left a voicemail.       Abnormal LFTs/transaminitis Previous partial  fenestrating cholecystectomy by Dr. Freida Busman 10/12/2022: Patient underwent CT abdomen pelvis and MRCP concerning for liver mass.  ERCP performed by GI on 7/14 and liver biopsy performed on 7/16.  Patient did well with this.  Had a plastic stent placed during ERCP.  We can resume Xarelto.  Prelim ERCP brushing results are positive for tumor cells.  Outpatient oncology team notified to help make arrangements as mentioned above.     AKI on CKD 3b Metabolic acidosis bicarb 18-17:  Baseline creatinine 1.2, now back to baseline with hydration.  Resume home meds.  Follow-up outpatient PCP.     Hypokalemia Hypomagnesemia: As needed repletion   CAD:  No chest pain   PAD:  Currently Xarelto    Hypertension:  Resume home meds   Type II DM with hyperglycemia:  hba1c was 7.9% in June 2024, sliding scale and Accu-Cheks   Normocytic anemia: hemoglobin slightly downtrending, continue to monitor anemia panel shows stable iron B12 and folate.     Discharge Diagnoses:  Principal Problem:   Jaundice Active Problems:   DM2 (diabetes mellitus, type 2) (HCC)   Carotid artery disease (HCC)   PAD (peripheral artery disease) (HCC)   Acute renal failure superimposed on stage 3b chronic kidney disease (HCC)   Coronary artery disease involving native heart without angina pectoris      Consultations: General surgery [facility]  Subjective: Feeling well no complaints.  Wishes to go home.  Discharge Exam: Vitals:   04/09/23 1015 04/09/23 1016  BP:  (!) 178/63  Pulse: 72   Resp: 10   Temp:    SpO2: 97%    Vitals:   04/09/23 1006 04/09/23 1010  04/09/23 1015 04/09/23 1016  BP: (!) 187/70 (!) 180/60  (!) 178/63  Pulse: 78 73 72   Resp: 13 14 10    Temp:      TempSrc:      SpO2: 99% 98% 97%   Weight:      Height:        General: Pt is alert, awake, not in acute distress Cardiovascular: RRR, S1/S2 +, no rubs, no gallops Respiratory: CTA bilaterally, no wheezing, no  rhonchi Abdominal: Soft, NT, ND, bowel sounds + Extremities: no edema, no cyanosis  Discharge Instructions  Discharge Instructions     Amb Referral to Oncology Navigator   Complete by: As directed    Please select the type of referral navigator?: GI   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  Needs outpatient follow-up      Allergies as of 04/09/2023       Reactions   Versed [midazolam] Anxiety   Frantic, out of my mind, agitated    Augmentin [amoxicillin-pot Clavulanate] Other (See Comments)   Elevates potassium level   Bactrim [sulfamethoxazole-trimethoprim] Other (See Comments)   ^ K+( elevated)    Demerol [meperidine]    Delusional    Meperidine Hcl    Other reaction(s): delusional   Scopolamine    Delusional    Tramadol Other (See Comments)   Keeps awake        Medication List     TAKE these medications    aspirin EC 81 MG tablet Take 81 mg by mouth at bedtime. Swallow whole.   atorvastatin 80 MG tablet Commonly known as: LIPITOR TAKE 1 TABLET BY MOUTH ONCE DAILY. MUST  KEEP  UPCOMING  APPOINTMENT  FOR  FUTURE  REFILLS   B-12 PO Take 1,000 mcg by mouth daily with supper.   brimonidine 0.2 % ophthalmic solution Commonly known as: ALPHAGAN Place 1 drop into the right eye 2 (two) times daily.   CINNAMON PO Take 1 capsule by mouth in the morning, at noon, and at bedtime.   ciprofloxacin 500 MG tablet Commonly known as: CIPRO Take 1 tablet (500 mg total) by mouth 2 (two) times daily for 2 days.   hydrochlorothiazide 12.5 MG tablet Commonly known as: HYDRODIURIL Take 1 tablet by mouth once daily   Insulin Lispro Prot & Lispro (75-25) 100 UNIT/ML Kwikpen Commonly known as: HumaLOG Mix 75/25 KwikPen INJECT 15 UNITS SUBCUTANEOUSLY ONCE DAILY WITH BREAKFAST What changed:  how much to take how to take this when to take this additional instructions   Jardiance 25 MG Tabs tablet Generic drug: empagliflozin TAKE 1 TABLET BY  MOUTH ONCE DAILY BEFORE BREAKFAST   metFORMIN 1000 MG tablet Commonly known as: GLUCOPHAGE Take 1 tablet by mouth twice daily   metoprolol tartrate 50 MG tablet Commonly known as: LOPRESSOR Take 1 tablet (50 mg total) by mouth 2 (two) times daily. What changed:  medication strength See the new instructions.   mupirocin ointment 2 % Commonly known as: BACTROBAN Apply 1 Application topically 2 (two) times daily.   OneTouch Delica Plus Lancet33G Misc Use to check blood sugars twice daily. What changed: additional instructions   OneTouch Verio test strip Generic drug: glucose blood USE TO CHECK BLOOD SUGARS ONCE DAILY   pantoprazole 40 MG tablet Commonly known as: PROTONIX Take 1 tablet by mouth once daily What changed:  when to take this additional instructions   repaglinide 2 MG tablet Commonly known as: PRANDIN Take 1 tab by breakfast and 1  tab before dinner What changed:  how much to take how to take this when to take this additional instructions   Systane Balance 0.6 % Soln Generic drug: Propylene Glycol Place 1 drop into the left eye 3 (three) times daily as needed (dry/irritated eyes).   telmisartan 80 MG tablet Commonly known as: MICARDIS Take 1 tablet by mouth once daily   Ultra-Thin II Mini Pen Needle 31G X 5 MM Misc Generic drug: Insulin Pen Needle USE AS DIRECTED   VITA-C PO Take 1 tablet by mouth daily.   VITAMIN D3 GUMMIES PO Take 2 tablets by mouth at bedtime.   Vitamin K2 100 MCG Tabs Take 100 mcg by mouth at bedtime.   Xarelto 2.5 MG Tabs tablet Generic drug: rivaroxaban Take 1 tablet by mouth twice daily        Follow-up Information     Fritzi Mandes, MD Follow up on 04/23/2023.   Specialty: General Surgery Why: 11am. Please call to confirm your appointment date and time. Please arrive 30 minutes prior to your appointment for paperwork. Contact information: [address] [place] [address] [phone]                 Allergies  Allergen Reactions   Versed [Midazolam] Anxiety    Frantic, out of my mind, agitated    Augmentin [Amoxicillin-Pot Clavulanate] Other (See Comments)    Elevates potassium level   Bactrim [Sulfamethoxazole-Trimethoprim] Other (See Comments)    ^ K+( elevated)    Demerol [Meperidine]     Delusional    Meperidine Hcl     Other reaction(s): delusional   Scopolamine     Delusional    Tramadol Other (See Comments)    Keeps awake    You were cared for by a hospitalist during your hospital stay. If you have any questions about your discharge medications or the care you received while you were in the hospital after you are discharged, you can call the unit and asked to speak with the hospitalist on call if the hospitalist that took care of you is not available. Once you are discharged, your primary care physician will handle any further medical issues. Please note that no refills for any discharge medications will be authorized once you are discharged, as it is imperative that you return to your primary care physician (or establish a relationship with a primary care physician if you do not have one) for your aftercare needs so that they can reassess your need for medications and monitor your lab values.  You were cared for by a hospitalist during your hospital stay. If you have any questions about your discharge medications or the care you received while you were in the hospital after you are discharged, you can call the unit and asked to speak with the hospitalist on call if the hospitalist that took care of you is not available. Once you are discharged, your primary care physician will handle any further medical issues. Please note that NO REFILLS for any discharge medications will be authorized once you are discharged, as it is imperative that you return to your primary care physician (or establish a relationship with a primary care physician if you do not have one) for your  aftercare needs so that they can reassess your need for medications and monitor your lab values.  Please request your Prim.MD to go over all Hospital Tests and Procedure/Radiological results at the follow up, please get all Hospital records sent to  your Prim MD by signing hospital release before you go home.  Get CBC, CMP, 2 view Chest X ray checked  by Primary MD during your next visit or SNF MD in 5-7 days ( we routinely change or add medications that can affect your baseline labs and fluid status, therefore we recommend that you get the mentioned basic workup next visit with your PCP, your PCP may decide not to get them or add new tests based on their clinical decision)  On your next visit with your primary care physician please Get Medicines reviewed and adjusted.  If you experience worsening of your admission symptoms, develop shortness of breath, life threatening emergency, suicidal or homicidal thoughts you must seek medical attention immediately by calling 911 or calling your MD immediately  if symptoms less severe.  You Must read complete instructions/literature along with all the possible adverse reactions/side effects for all the Medicines you take and that have been prescribed to you. Take any new Medicines after you have completely understood and accpet all the possible adverse reactions/side effects.   Do not drive, operate heavy machinery, perform activities at heights, swimming or participation in water activities or provide baby sitting services if your were admitted for syncope or siezures until you have seen by Primary MD or a Neurologist and advised to do so again.  Do not drive when taking Pain medications.   Procedures/Studies: US BIOPSY (LIVER)  Result Date: 04/09/2023 INDICATION: Liver lesion, jaundice EXAM: ULTRASOUND CENTRAL LIVER LESION CORE BIOPSY MEDICATIONS: 1% lidocaine local ANESTHESIA/SEDATION: Moderate (conscious) sedation was employed during this procedure. A  total of Versed 0 mg and Fentanyl 100 mcg was administered intravenously by the radiology nurse. Total intra-service moderate Sedation Time: 0 minutes. The patient's level of consciousness and vital signs were monitored continuously by radiology nursing throughout the procedure under my direct supervision. COMPLICATIONS: None immediate. PROCEDURE: Informed written consent was obtained from the patient after a thorough discussion of the procedural risks, benefits and alternatives. All questions were addressed. Maximal Sterile Barrier Technique was utilized including caps, mask, sterile gowns, sterile gloves, sterile drape, hand hygiene and skin antiseptic. A timeout was performed prior to the initiation of the procedure. Previous imaging reviewed. Preliminary ultrasound performed. The right upper quadrant central liver lesion adjacent to the gallbladder fossa was localized and marked for biopsy. Under sterile conditions and local anesthesia, the 17 gauge guide was advanced to the ill-defined hypoechoic lesion. Needle position confirmed with ultrasound. 2 18 gauge core biopsy obtained. These were intact and non fragmented. Samples were placed in formalin. Needle tract occluded with Gel-Foam. Images obtained for documentation. Postprocedure imaging demonstrates no hemorrhage or hematoma. Patient tolerated biopsy well. IMPRESSION: Successful ultrasound-guided core biopsy of the right upper quadrant central liver lesion. Electronically Signed   By: Judie Petit.  Shick M.D.   On: 04/09/2023 10:43   DG ERCP  Result Date: 04/07/2023 CLINICAL DATA:  ERCP with stone removal and biliary stent placement. History of cholecystectomy performed 10/12/2022. EXAM: ERCP TECHNIQUE: Multiple spot images obtained with the fluoroscopic device and submitted for interpretation post-procedure. FLUOROSCOPY TIME: FLUOROSCOPY TIME 5 minutes, 4 seconds (73.3 mGy) COMPARISON:  MRCP-04/05/2023; CT abdomen pelvis-04/04/2023 FINDINGS: Five spot  intraoperative fluoroscopic images of the right upper abdominal quadrant during ERCP are provided for review Initial image demonstrates an ERCP probe overlying the right upper abdominal quadrant Subsequent images demonstrate selective cannulation and opacification of the common bile duct. Subsequent images demonstrate insufflation of a balloon within the mid aspect of the CBD with subsequent  biliary sweeping and presumed sphincterotomy. Completion image demonstrates placement of a biliary stent within the CBD. There is minimal opacification of the intrahepatic biliary tree which appears nondilated. There is opacification of the cystic duct remnant. Contrast injection demonstrates opacification of the questioned lesion within segment V of the liver adjacent to the gallbladder fossa. There is no definitive opacification of the pancreatic duct. IMPRESSION: 1. ERCP with biliary sweeping and stent placement as above. 2. 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 These images were submitted for radiologic interpretation only. Please see the procedural report for the amount of contrast and the fluoroscopy time utilized. Electronically Signed   By: Simonne Come M.D.   On: 04/07/2023 11:33   MR ABDOMEN MRCP W WO CONTAST  Result Date: 04/05/2023 CLINICAL DATA:  82 year old female with history of jaundice. Status post laparoscopic cholecystectomy 10/12/2022. EXAM: MRI ABDOMEN WITHOUT AND WITH CONTRAST (INCLUDING MRCP) TECHNIQUE: Multiplanar multisequence MR imaging of the abdomen was performed both before and after the administration of intravenous contrast. Heavily T2-weighted images of the biliary and pancreatic ducts were obtained, and  three-dimensional MRCP images were rendered by post processing. CONTRAST:  6mL GADAVIST GADOBUTROL 1 MMOL/ML IV SOLN COMPARISON:  Abdominal MRI 08/30/2022. CT of the abdomen and pelvis 04/04/2023. FINDINGS: Lower chest: Susceptibility artifact in the lower sternum from indwelling median sternotomy wires. Hepatobiliary: Patient is status post cholecystectomy. When compared to the prior examination, there is increasingly prominent mass-like architectural distortion in the liver adjacent to the gallbladder fossa centered predominantly in segment 5. This areas heterogeneous in signal intensity on T1 and T2 weighted images, but generally centrally T1 hypointense with heterogeneous T2 hyperintensity (including central areas that appear cystic). There is extensive hyperenhancement during arterial phase imaging in the surrounding hepatic parenchyma, but the central nidus of the lesion is estimated to measure approximately 3.5 x 2.5 x 2.0 cm on axial image 78 of series 1001 and coronal image 83 of series 12, where the lesion appears centrally hypovascular with a peripheral rim of enhancement. The peripheral hypervascularity appears to resolve rapidly on more delayed post gadolinium imaging (although subtraction imaging demonstrates persistent peripheral enhancement), and the lesion appears hypovascular centrally on delayed imaging. Diffusion imaging demonstrates heterogeneous areas of diffusion restriction associated with this lesion. MRCP images demonstrates some mild intrahepatic biliary ductal dilatation. There is signal void in the region of the common hepatic duct (coronal MRCP image 68 of series 6), which could be related to artifact from cholecystectomy clips. Immediately distal to this the common hepatic duct measures 5 mm. Cystic duct remnant is not dilated. Common bile duct is normal in caliber measuring only 4 mm in the porta hepatis. No filling defects are noted in the common bile duct to suggest  choledocholithiasis. Pancreas: No pancreatic mass. No pancreatic ductal dilatation. No pancreatic or peripancreatic fluid collections or inflammatory changes. Spleen:  Unremarkable. Adrenals/Urinary Tract: Subcentimeter T1 hypointense, T2 hyperintense, nonenhancing lesion in the lower pole of the left kidney, compatible with a tiny simple cyst (Bosniak class 1, no imaging follow-up recommended). Right kidney and bilateral adrenal glands are normal in appearance. No hydroureteronephrosis in the visualized portions of the abdomen. Stomach/Bowel: Visualized portions are unremarkable. Vascular/Lymphatic: No aneurysm identified in the visualized abdominal vasculature. No lymphadenopathy noted in the abdomen.  Other: No significant volume of ascites noted in the visualized portions of the peritoneal cavity. Musculoskeletal: No aggressive appearing osseous lesions are noted in the visualized portions of the skeleton. IMPRESSION: 1. Unusual but increasingly aggressive and mass-like appearing lesion in segment 5 of the liver adjacent to the gallbladder fossa, concerning for potential neoplasm. Tissue sampling should be considered to establish a tissue diagnosis. Further evaluation with PET-CT should also be considered. 2. Mild intrahepatic biliary ductal dilatation, new compared to the preoperative study before laparoscopic cholecystectomy. Today's study demonstrates signal void in the region of the common hepatic duct. This could be artifactual and related to cholecystectomy clips, however, the possibility of postoperative stricture is not excluded. Correlation with liver function tests is recommended. Lack of common bile duct dilatation is unusual in this post cholecystectomy patient, and could suggest narrowing of the common hepatic duct. ERCP could be considered for further evaluation if clinically appropriate. 3. Additional incidental findings, as above. These results will be called to the ordering clinician or  representative by the Radiologist Assistant, and communication documented in the PACS or [employer]. Electronically Signed   By: Trudie Reed M.D.   On: 04/05/2023 10:20   MR 3D Recon At Scanner  Result Date: 04/05/2023 CLINICAL DATA:  82 year old female with history of jaundice. Status post laparoscopic cholecystectomy 10/12/2022. EXAM: MRI ABDOMEN WITHOUT AND WITH CONTRAST (INCLUDING MRCP) TECHNIQUE: Multiplanar multisequence MR imaging of the abdomen was performed both before and after the administration of intravenous contrast. Heavily T2-weighted images of the biliary and pancreatic ducts were obtained, and three-dimensional MRCP images were rendered by post processing. CONTRAST:  6mL GADAVIST GADOBUTROL 1 MMOL/ML IV SOLN COMPARISON:  Abdominal MRI 08/30/2022. CT of the abdomen and pelvis 04/04/2023. FINDINGS: Lower chest: Susceptibility artifact in the lower sternum from indwelling median sternotomy wires. Hepatobiliary: Patient is status post cholecystectomy. When compared to the prior examination, there is increasingly prominent mass-like architectural distortion in the liver adjacent to the gallbladder fossa centered predominantly in segment 5. This areas heterogeneous in signal intensity on T1 and T2 weighted images, but generally centrally T1 hypointense with heterogeneous T2 hyperintensity (including central areas that appear cystic). There is extensive hyperenhancement during arterial phase imaging in the surrounding hepatic parenchyma, but the central nidus of the lesion is estimated to measure approximately 3.5 x 2.5 x 2.0 cm on axial image 78 of series 1001 and coronal image 83 of series 12, where the lesion appears centrally hypovascular with a peripheral rim of enhancement. The peripheral hypervascularity appears to resolve rapidly on more delayed post gadolinium imaging (although subtraction imaging demonstrates persistent peripheral enhancement), and the lesion appears hypovascular  centrally on delayed imaging. Diffusion imaging demonstrates heterogeneous areas of diffusion restriction associated with this lesion. MRCP images demonstrates some mild intrahepatic biliary ductal dilatation. There is signal void in the region of the common hepatic duct (coronal MRCP image 68 of series 6), which could be related to artifact from cholecystectomy clips. Immediately distal to this the common hepatic duct measures 5 mm. Cystic duct remnant is not dilated. Common bile duct is normal in caliber measuring only 4 mm in the porta hepatis. No filling defects are noted in the common bile duct to suggest choledocholithiasis. Pancreas: No pancreatic mass. No pancreatic ductal dilatation. No pancreatic or peripancreatic fluid collections or inflammatory changes. Spleen:  Unremarkable. Adrenals/Urinary Tract: Subcentimeter T1 hypointense, T2 hyperintense, nonenhancing lesion in the lower pole of the left kidney, compatible with a tiny simple cyst (Bosniak class 1, no imaging follow-up  recommended). Right kidney and bilateral adrenal glands are normal in appearance. No hydroureteronephrosis in the visualized portions of the abdomen. Stomach/Bowel: Visualized portions are unremarkable. Vascular/Lymphatic: No aneurysm identified in the visualized abdominal vasculature. No lymphadenopathy noted in the abdomen. Other: No significant volume of ascites noted in the visualized portions of the peritoneal cavity. Musculoskeletal: No aggressive appearing osseous lesions are noted in the visualized portions of the skeleton. IMPRESSION: 1. Unusual but increasingly aggressive and mass-like appearing lesion in segment 5 of the liver adjacent to the gallbladder fossa, concerning for potential neoplasm. Tissue sampling should be considered to establish a tissue diagnosis. Further evaluation with PET-CT should also be considered. 2. Mild intrahepatic biliary ductal dilatation, new compared to the preoperative study before  laparoscopic cholecystectomy. Today's study demonstrates signal void in the region of the common hepatic duct. This could be artifactual and related to cholecystectomy clips, however, the possibility of postoperative stricture is not excluded. Correlation with liver function tests is recommended. Lack of common bile duct dilatation is unusual in this post cholecystectomy patient, and could suggest narrowing of the common hepatic duct. ERCP could be considered for further evaluation if clinically appropriate. 3. Additional incidental findings, as above. These results will be called to the ordering clinician or representative by the Radiologist Assistant, and communication documented in the PACS or [employer]. Electronically Signed   By: Trudie Reed M.D.   On: 04/05/2023 10:20   CT ABDOMEN PELVIS WO CONTRAST  Result Date: 04/04/2023 CLINICAL DATA:  Jaundice, concern for malignancy. EXAM: CT ABDOMEN AND PELVIS WITHOUT CONTRAST TECHNIQUE: Multidetector CT imaging of the abdomen and pelvis was performed following the standard protocol without IV contrast. RADIATION DOSE REDUCTION: This exam was performed according to the departmental dose-optimization program which includes automated exposure control, adjustment of the mA and/or kV according to patient size and/or use of iterative reconstruction technique. COMPARISON:  MRI abdomen 08/30/2022.  CT abdomen 08/14/2022. FINDINGS: Lower chest: No acute abnormality. Hepatobiliary: There some ill-defined heterogeneous hypodensity in the region of the gallbladder fossa measuring 2.4 x 3.0 cm this appears similar to the prior study given lack of contrast. Gallbladder is surgically absent. There is no biliary ductal dilatation. Pancreas: Unremarkable. No pancreatic ductal dilatation or surrounding inflammatory changes. Spleen: Normal in size without focal abnormality. Adrenals/Urinary Tract: There is a 3 mm calculus in the right kidney. There is no hydronephrosis or  perinephric fat stranding. Left kidney, ureters and bladder are normal. Stomach/Bowel: Stomach is within normal limits. No evidence of bowel wall thickening, distention, or inflammatory changes. The appendix is not seen. There is a large amount of stool throughout colon. Vascular/Lymphatic: There severe atherosclerotic calcifications of the aorta, iliac arteries and branch vessels. There are no enlarged lymph nodes identified. Vascular stents are seen in the femoral regions along with postsurgical change and scarring bilaterally. Reproductive: Uterus and bilateral adnexa are unremarkable. Other: No abdominal wall hernia or abnormality. No abdominopelvic ascites. Musculoskeletal: There are few scattered rounded lucent areas in the vertebral bodies particularly at L2. These are similar to prior. IMPRESSION: 1. Ill-defined hypodensity in the region of the gallbladder fossa appears similar to the prior study given lack of contrast. This would be better evaluated with dedicated MRI. 2. Nonobstructing right renal calculus. 3. Large amount of stool throughout the colon. 4. Stable indeterminate lucent areas in the vertebral bodies of the lumbar spine. 5. Severe atherosclerotic disease. Aortic Atherosclerosis (ICD10-I70.0). Electronically Signed   By: Darliss Cheney M.D.   On: 04/04/2023 23:19  The results of significant diagnostics from this hospitalization (including imaging, microbiology, ancillary and laboratory) are listed below for reference.     Microbiology: No results found for this or any previous visit (from the past 240 hour(s)).   Labs: BNP (last 3 results) No results for input(s): "BNP" in the last 8760 hours. Basic Metabolic Panel: Recent Labs  Lab 04/05/23 0205 04/05/23 1430 04/06/23 0613 04/07/23 0426 04/08/23 0732 04/09/23 0301  NA 138 141 139 138 137 139  K 2.9* 4.6 4.7 3.4* 3.8 3.5  CL 110 114* 113* 113* 111 112*  CO2 17* 19* 19* 17* 19* 20*  GLUCOSE 60* 86 144* 162* 158* 116*   BUN 25* 20 18 17 21 18   CREATININE 1.75* 1.62* 1.73* 1.68* 1.48* 1.37*  CALCIUM 8.4* 9.2 9.0 8.7* 9.1 9.1  MG 1.4*  --  2.2  --   --  1.4*   Liver Function Tests: Recent Labs  Lab 04/05/23 0205 04/06/23 0613 04/07/23 0426 04/08/23 0732 04/09/23 0301  AST 163* 171* 160* 109* 81*  ALT 115* 113* 112* 103* 89*  ALKPHOS 550* 587* 540* 498* 489*  BILITOT 11.1* 13.1* 13.3* 5.6* 3.6*  PROT 5.7* 5.5* 5.0* 5.3* 5.2*  ALBUMIN 2.5* 2.2* 2.0* 2.0* 2.2*   Recent Labs  Lab 04/05/23 0205  LIPASE 43   No results for input(s): "AMMONIA" in the last 168 hours. CBC: Recent Labs  Lab 04/04/23 2138 04/05/23 0205 04/06/23 0613 04/07/23 0426 04/08/23 0732 04/09/23 0301  WBC 11.0* 9.9 7.5 6.5 8.4 9.3  NEUTROABS 7.8*  --   --   --   --   --   HGB 11.3* 9.6* 9.5* 8.9* 9.4* 9.0*  HCT 34.9* 28.7* 29.5* 27.4* 27.6* 26.5*  MCV 86.6 85.4 87.8 86.7 83.9 86.6  PLT 210 171 147* 143* 175 174   Cardiac Enzymes: No results for input(s): "CKTOTAL", "CKMB", "CKMBINDEX", "TROPONINI" in the last 168 hours. BNP: Invalid input(s): "POCBNP" CBG: Recent Labs  Lab 04/08/23 1558 04/08/23 2046 04/08/23 2358 04/09/23 0748 04/09/23 1202  GLUCAP 238* 232* 157* 90 105*   D-Dimer No results for input(s): "DDIMER" in the last 72 hours. Hgb A1c No results for input(s): "HGBA1C" in the last 72 hours. Lipid Profile No results for input(s): "CHOL", "HDL", "LDLCALC", "TRIG", "CHOLHDL", "LDLDIRECT" in the last 72 hours. Thyroid function studies No results for input(s): "TSH", "T4TOTAL", "T3FREE", "THYROIDAB" in the last 72 hours.  Invalid input(s): "FREET3" Anemia work up No results for input(s): "VITAMINB12", "FOLATE", "FERRITIN", "TIBC", "IRON", "RETICCTPCT" in the last 72 hours. Urinalysis    Component Value Date/Time   COLORURINE AMBER (A) 04/04/2023 2140   APPEARANCEUR HAZY (A) 04/04/2023 2140   LABSPEC 1.007 04/04/2023 2140   PHURINE 5.0 04/04/2023 2140   GLUCOSEU >=500 (A) 04/04/2023 2140    HGBUR MODERATE (A) 04/04/2023 2140   BILIRUBINUR NEGATIVE 04/04/2023 2140   KETONESUR NEGATIVE 04/04/2023 2140   PROTEINUR 30 (A) 04/04/2023 2140   NITRITE NEGATIVE 04/04/2023 2140   LEUKOCYTESUR NEGATIVE 04/04/2023 2140   Sepsis Labs Recent Labs  Lab 04/06/23 0613 04/07/23 0426 04/08/23 0732 04/09/23 0301  WBC 7.5 6.5 8.4 9.3   Microbiology No results found for this or any previous visit (from the past 240 hour(s)).   Time coordinating discharge:  I have spent 35 minutes face to face with the patient and on the ward discussing the patients care, assessment, plan and disposition with other care givers. >50% of the time was devoted counseling the patient about the risks and benefits  of treatment/Discharge disposition and coordinating care.   SIGNED:   Dimple Nanas, MD  [facility] 04/09/2023, 12:19 PM   If 7PM-7AM, please contact night-coverage

## 2023-04-09 NOTE — Progress Notes (Signed)
[facility] Progress Note  SUBJECTIVE:   Interval history: [name] was seen and evaluated today at bedside. Resting comfortably. Noted having some prior RUQ pain that has subsided since her liver biopsy this AM. No nausea or vomiting. No chest pain or shortness of breath. Noted that she feels ready for discharge. Has some dry mouth.  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 There is a small calcified mass in the L:A near the LA appendage. No valvular masses seen with associated mitral annular calcification. LA Volume/ BSA27.4 ml/m2 No AS. Right ventricular systolic pressure is elevated at .   Myocardial infarction [facility])    June 2021   Neuromuscular disorder (HCC)    neuropathy in bilateral feet   Peripheral vascular disease (HCC)    Pneumonia    not hosp.    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  PTCAs with drug coated balloon Lt ant tibial artery and Lt popliteal artery  11/26/2019   Past Surgical History:  Procedure Laterality Date   ABDOMINAL AORTAGRAM  11/25/2019   ABDOMINAL AORTOGRAM W/LOWER EXTREMITY    ABDOMINAL AORTOGRAM N/A 06/15/2019   Procedure: ABDOMINAL AORTOGRAM;  Surgeon: Runell Gess, MD;  [facility]: [facility];  Service: Cardiovascular;  Laterality: N/A;   ABDOMINAL AORTOGRAM W/LOWER EXTREMITY N/A 11/25/2019   Procedure: ABDOMINAL AORTOGRAM W/LOWER EXTREMITY;  Surgeon: Iran Ouch, MD;  [facility]: [facility]  INVAS[facility];  Service: Cardiovascular;  Laterality: N/A;  Lt leg   ABDOMINAL AORTOGRAM W/LOWER EXTREMITY N/A 08/01/2020   Procedure: ABDOMINAL AORTOGRAM W/LOWER EXTREMITY;  Surgeon: Runell Gess, MD;  [facility];  Service: Cardiovascular;  Laterality: N/A;   ABDOMINAL AORTOGRAM W/LOWER EXTREMITY N/A 04/28/2021   Procedure: ABDOMINAL AORTOGRAM W/LOWER EXTREMITY;  Surgeon: Sherren Kerns, MD;  [facility];  Service: Cardiovascular;  Laterality: N/A;   ABDOMINAL AORTOGRAM W/LOWER EXTREMITY N/A 10/13/2021   Procedure: ABDOMINAL AORTOGRAM W/LOWER EXTREMITY;  Surgeon: Leonie Douglas, MD;  [facility];  Service: Cardiovascular;  Laterality: N/A;   ABDOMINAL AORTOGRAM W/LOWER EXTREMITY N/A 11/09/2022   Procedure: ABDOMINAL AORTOGRAM W/LOWER EXTREMITY;  Surgeon: Leonie Douglas, MD;  [facility];  Service: Cardiovascular;  Laterality: N/A;   ABDOMINAL AORTOGRAM W/LOWER EXTREMITY N/A 11/16/2022   Procedure: ABDOMINAL AORTOGRAM W/LOWER EXTREMITY;  Surgeon: Leonie Douglas, MD;  [facility];  Service: Cardiovascular;  Laterality: N/A;   AMPUTATION TOE Left 05/03/2021   Procedure: AMPUTATION OF THIRD LEFT  TOE;  Surgeon: Leonie Douglas, MD;  Location: [facility] OR;  Service: Vascular;  Laterality: Left;   APPENDECTOMY     APPLICATION OF WOUND VAC Right 07/21/2019   Procedure: Application Of Prevena Wound Vac Right Groin;  Surgeon: Nada Libman, MD;  Location: [facility] OR;  Service: Vascular;  Laterality: Right;  CARPAL TUNNEL RELEASE Left    CARPAL TUNNEL RELEASE Right    CESAREAN SECTION     x 2   CHOLECYSTECTOMY N/A 10/12/2022   Procedure: LAPAROSCOPIC PARTIAL FENESTRATING CHOLECYSTECTOMY;  Surgeon: Fritzi Mandes, MD;  [facility];  Service: General;  Laterality: N/A;   COLONOSCOPY     CORONARY ARTERY BYPASS GRAFT N/A 03/24/2020   Procedure: CORONARY ARTERY BYPASS GRAFTING (CABG) using LIMA to LAD (m);  RIMA to RAMUS; Endoscopic Right Greater Saphenous Vein: SVG to Diag1; SVG to PLB (right); and SVG to PL (left).;  Surgeon: Linden Dolin, MD;  Location[facility] OR;  Service: Open Heart Surgery;  Laterality: N/A;  BILATERAL IMA   ENDARTERECTOMY FEMORAL Right 07/21/2019   Procedure: RIGHT ENDARTERECTOMY FEMORAL WITH PATCH ANGIOPLASTY;  Surgeon: Nada Libman, MD;  [facility];  Service: Vascular;  Laterality: Right;   ENDARTERECTOMY FEMORAL Right 08/16/2020   Procedure: RIGHT FEMORAL ENDARTERECTOMY;  Surgeon: Maeola Harman, MD;  Location: [facility] OR;  Service: Vascular;  Laterality: Right;   ENDOVEIN HARVEST OF GREATER SAPHENOUS VEIN Right 03/24/2020   Procedure: ENDOVEIN HARVEST OF GREATER SAPHENOUS VEIN;  Surgeon: Linden Dolin, MD;  [facility];  Service: Open Heart Surgery;  Laterality: Right;   EYE SURGERY     cataract removal bilaterally   FEMORAL-POPLITEAL BYPASS GRAFT Right 08/16/2020   Procedure: BYPASS GRAFT FEMORAL-POPLITEAL ARTERY;  Surgeon: Maeola Harman, MD;  Location: [facility] OR;  Service: Vascular;  Laterality: Right;   FEMORAL-POPLITEAL BYPASS GRAFT Left 05/03/2021   Procedure: LEFT FEMORAL TO BELOW KNEE POPLITEAL ARTERY BYPASS GRAFTING WITH [id number] PTFE GRAFT;  Surgeon: Leonie Douglas, MD;  [facility];  Service: Vascular;  Laterality: Left;   LEFT HEART CATH AND CORONARY ANGIOGRAPHY N/A 03/22/2020   Procedure: LEFT HEART CATH AND CORONARY ANGIOGRAPHY;  Surgeon: Swaziland, Peter M, MD;  Location: [facility] INVASIVE CV LAB;  Service: Cardiovascular;  Laterality: N/A;   LOWER EXTREMITY ANGIOGRAPHY Bilateral 06/15/2019   Procedure: Lower Extremity Angiography;  Surgeon: Runell Gess, MD;  Location: [facility] INVASIVE CV LAB;  Service: Cardiovascular;  Laterality: Bilateral;   LOWER EXTREMITY ANGIOGRAPHY Right 08/03/2019   Procedure: LOWER EXTREMITY ANGIOGRAPHY;  Surgeon: Runell Gess, MD;  [facility];  Service: Cardiovascular;  Laterality:  Right;   LOWER EXTREMITY ANGIOGRAPHY N/A 09/07/2021   Procedure: LOWER EXTREMITY ANGIOGRAPHY;  Surgeon: Cephus Shelling, MD;  [facility];  Service: Cardiovascular;  Laterality: N/A;   PATCH ANGIOPLASTY Right 07/21/2019   Procedure: Patch Angioplasty Right Femoral Artery;  Surgeon: Nada Libman, MD;  Location: [facility] OR;  Service: Vascular;  Laterality: Right;   PERIPHERAL INTRAVASCULAR LITHOTRIPSY  11/25/2019   Procedure: INTRAVASCULAR LITHOTRIPSY;  Surgeon: Iran Ouch, MD;  [facility];  Service: Cardiovascular;;  LT. SFA   PERIPHERAL VASCULAR ATHERECTOMY  08/03/2019   Procedure: PERIPHERAL VASCULAR ATHERECTOMY;  Surgeon: Runell Gess, MD;  Location: [facility] INVASIVE CV LAB;  Service: Cardiovascular;;  right SFA, right TP trunk   PERIPHERAL VASCULAR ATHERECTOMY  11/25/2019   Procedure: PERIPHERAL VASCULAR ATHERECTOMY;  Surgeon: Iran Ouch, MD;  [facility];  Service: Cardiovascular;;  Lt.  POPLITEAL and AT   PERIPHERAL VASCULAR BALLOON ANGIOPLASTY Left 06/15/2019   Procedure: PERIPHERAL VASCULAR BALLOON ANGIOPLASTY;  Surgeon: Runell Gess, MD;  [facility];  Service: Cardiovascular;  Laterality: Left;  SFA UNSUCCESSFUL UNABLE TO CROSS LESION   PERIPHERAL VASCULAR BALLOON ANGIOPLASTY  08/03/2019   Procedure: PERIPHERAL VASCULAR BALLOON ANGIOPLASTY;  Surgeon: Runell Gess, MD;  Location: [facility] INVASIVE CV LAB;  Service: Cardiovascular;;  right SFA, Right TP trunk   PERIPHERAL VASCULAR BALLOON ANGIOPLASTY  09/07/2021   Procedure: PERIPHERAL VASCULAR BALLOON ANGIOPLASTY;  Surgeon: Cephus Shelling, MD;  [facility];  Service: Cardiovascular;;   PERIPHERAL VASCULAR BALLOON ANGIOPLASTY Right 10/13/2021   Procedure: PERIPHERAL VASCULAR BALLOON ANGIOPLASTY;  Surgeon: Leonie Douglas, MD;  [facility];  Service: Cardiovascular;  Laterality: Right;   PERIPHERAL VASCULAR  BALLOON ANGIOPLASTY  11/09/2022   Procedure: PERIPHERAL VASCULAR BALLOON ANGIOPLASTY;  Surgeon: Leonie Douglas, MD;  [facility];  Service: Cardiovascular;;  fem-pop bypass and AT   TEE WITHOUT CARDIOVERSION N/A 03/24/2020   Procedure: TRANSESOPHAGEAL ECHOCARDIOGRAM (TEE);  Surgeon: Linden Dolin, MD;  [facility] OR;  Service: Open Heart Surgery;  Laterality: N/A;   TONSILLECTOMY     and adenoidectomy   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/2019   Procedure: TRANSMETATARSAL AMPUTATION;  Surgeon: Vivi Barrack, DPM;  [facility];  Service: Podiatry;  Laterality: Right;   TRANSMETATARSAL AMPUTATION Left 05/11/2021   Procedure: LEFT TRANSMETATARSAL AMPUTATION;  Surgeon: Maeola Harman, MD;  Location: [facility] OR;  Service: Vascular;  Laterality: Left;   TUBAL LIGATION     Current Facility-Administered Medications  Medication Dose Route Frequency Provider Last Rate Last Admin   acetaminophen (TYLENOL) tablet 650 mg  650 mg Oral Q6H PRN Amin, Ankit Chirag, MD       brimonidine (ALPHAGAN) 0.2 % ophthalmic solution 1 drop  1 drop Right Eye BID Vida Rigger, MD   1 drop at 04/09/23 1048   ciprofloxacin (CIPRO) tablet 500 mg  500 mg Oral BID Pham, Minh Q, RPH-CPP   500 mg at 04/08/23 2051   guaiFENesin (ROBITUSSIN) 100 MG/5ML liquid 5 mL  5 mL Oral Q4H PRN Amin, Ankit Chirag, MD       hydrALAZINE (APRESOLINE) injection 10 mg  10 mg Intravenous Q4H PRN Amin, Ankit Chirag, MD       insulin aspart (novoLOG) injection 0-6 Units  0-6 Units Subcutaneous Q4H Magod, Vernia Buff, MD   1 Units at 04/09/23 0012   ipratropium-albuterol (DUONEB) 0.5-2.5 (3) MG/3ML nebulizer solution 3 mL  3 mL Nebulization Q4H PRN Amin, Ankit Chirag, MD       magnesium sulfate IVPB 4 g 100 mL  4 g Intravenous Once Amin, Ankit Chirag, MD 50 mL/hr at 04/09/23 1045 4 g at 04/09/23 1045   metoprolol tartrate (LOPRESSOR) injection 5 mg  5 mg Intravenous Q4H PRN Amin, Ankit Chirag, MD       metoprolol  tartrate (LOPRESSOR) tablet 50 mg  50 mg Oral BID Vida Rigger, MD   50 mg at 04/09/23 0840   ondansetron (ZOFRAN) tablet 4 mg  4 mg Oral Q6H PRN Vida Rigger, MD       Or   ondansetron (ZOFRAN) injection 4 mg  4 mg Intravenous Q6H PRN Vida Rigger, MD       pantoprazole (PROTONIX) EC tablet 40 mg  40 mg Oral Daily Vida Rigger, MD   40 mg at 04/09/23 0840   potassium chloride 10 mEq in 100 mL IVPB  10 mEq Intravenous Q1 Hr x 3 Amin, Ankit Chirag, MD 100 mL/hr at 04/09/23 1047 10 mEq at 04/09/23 1047   senna-docusate (Senokot-S) tablet 1 tablet  1 tablet Oral QHS PRN Dimple Nanas, MD  Allergies as of 04/04/2023 - Review Complete 04/04/2023  Allergen Reaction Noted   Versed [midazolam] Anxiety 08/06/2019   Augmentin [amoxicillin-pot clavulanate] Other (See Comments) 11/14/2021   Bactrim [sulfamethoxazole-trimethoprim] Other (See Comments) 08/06/2019   Demerol [meperidine]  03/30/2013   Meperidine hcl  01/29/2022   Scopolamine  05/12/2014   Tramadol Other (See Comments) 04/27/2021   Review of Systems:  Review of Systems  Respiratory:  Negative for shortness of breath.   Cardiovascular:  Negative for chest pain.  Gastrointestinal:  Positive for abdominal pain. Negative for nausea and vomiting.    OBJECTIVE:   Temp:  [97.9 F (36.6 C)-98.9 F (37.2 C)] 98.9 F (37.2 C) (07/16 0747) Pulse Rate:  [64-100] 72 (07/16 1015) Resp:  [8-18] 10 (07/16 1015) BP: (158-196)/(54-70) 178/63 (07/16 1016) SpO2:  [97 %-100 %] 97 % (07/16 1015) Last BM Date : 04/08/23 Physical Exam Constitutional:      General: She is not in acute distress.    Appearance: She is not ill-appearing, toxic-appearing or diaphoretic.  Cardiovascular:     Rate and Rhythm: Normal rate and regular rhythm.     Heart sounds: Murmur heard.  Pulmonary:     Effort: No respiratory distress.     Breath sounds: Normal breath sounds.  Abdominal:     General: Bowel sounds are normal. There is no distension.      Palpations: Abdomen is soft.     Tenderness: There is no abdominal tenderness. There is no guarding.  Neurological:     Mental Status: She is alert.     Labs: Recent Labs    04/07/23 0426 04/08/23 0732 04/09/23 0301  WBC 6.5 8.4 9.3  HGB 8.9* 9.4* 9.0*  HCT 27.4* 27.6* 26.5*  PLT 143* 175 174   BMET Recent Labs    04/07/23 0426 04/08/23 0732 04/09/23 0301  NA 138 137 139  K 3.4* 3.8 3.5  CL 113* 111 112*  CO2 17* 19* 20*  GLUCOSE 162* 158* 116*  BUN 17 21 18   CREATININE 1.68* 1.48* 1.37*  CALCIUM 8.7* 9.1 9.1   LFT Recent Labs    04/09/23 0301  PROT 5.2*  ALBUMIN 2.2*  AST 81*  ALT 89*  ALKPHOS 489*  BILITOT 3.6*   PT/INR Recent Labs    04/08/23 0732  LABPROT 15.6*  INR 1.2   Diagnostic imaging: US BIOPSY (LIVER)  Result Date: 04/09/2023 INDICATION: Liver lesion, jaundice EXAM: ULTRASOUND CENTRAL LIVER LESION CORE BIOPSY MEDICATIONS: 1% lidocaine local ANESTHESIA/SEDATION: Moderate (conscious) sedation was employed during this procedure. A total of Versed 0 mg and Fentanyl 100 mcg was administered intravenously by the radiology nurse. Total intra-service moderate Sedation Time: 0 minutes. The patient's level of consciousness and vital signs were monitored continuously by radiology nursing throughout the procedure under my direct supervision. COMPLICATIONS: None immediate. PROCEDURE: Informed written consent was obtained from the patient after a thorough discussion of the procedural risks, benefits and alternatives. All questions were addressed. Maximal Sterile Barrier Technique was utilized including caps, mask, sterile gowns, sterile gloves, sterile drape, hand hygiene and skin antiseptic. A timeout was performed prior to the initiation of the procedure. Previous imaging reviewed. Preliminary ultrasound performed. The right upper quadrant central liver lesion adjacent to the gallbladder fossa was localized and marked for biopsy. Under sterile conditions and local  anesthesia, the 17 gauge guide was advanced to the ill-defined hypoechoic lesion. Needle position confirmed with ultrasound. 2 18 gauge core biopsy obtained. These were intact and non fragmented. Samples were placed in  formalin. Needle tract occluded with Gel-Foam. Images obtained for documentation. Postprocedure imaging demonstrates no hemorrhage or hematoma. Patient tolerated biopsy well. IMPRESSION: Successful ultrasound-guided core biopsy of the right upper quadrant central liver lesion. Electronically Signed   By: Judie Petit.  Shick M.D.   On: 04/09/2023 10:43    IMPRESSION: Jaundice, improving Hyperbilirubinemia, improved  -S/P ERCP 04/07/23 Dr. Ewing Schlein status post biliary sphincterotomy, biliary brushing obtained at hepatic duct bifurcation, status post biliary stent placement into the common bile duct Abnormal MRI imaging, concern for mass like lesion in gallbladder fossa   -Status post liver biopsy 04/09/23 Status post fenestrating cholecystectomy on 10/12/2022 Coronary artery disease status post stent in March 2021 Diabetes mellitus  PLAN: -Pathology from CBD brushing returned with "atypical cells present, atypical large cells suspicious for tumor", these findings were discussed with patient and she verbalized understanding -Liver biopsy should hopefully help clarify above results more  -Follow up other tumor marker tests -Recommend follow up with Dr. Ewing Schlein in office within next 4 weeks, will require repeat ERCP for biliary drain removal/exchange in roughly 6-8 weeks -Trend liver enzyme panel -Appears appropriate for discharge from GI perspective  -[facility] will sign off   LOS: 5 days   [name], [facility]

## 2023-04-09 NOTE — Care Management Important Message (Signed)
Important Message  Patient Details  Name: [name] MRN: [id number] Date of Birth: 04/22/1941   Medicare Important Message Given:  Yes     Yasheka Fossett Stefan Church 04/09/2023, 2:11 PM

## 2023-04-09 NOTE — Procedures (Signed)
Interventional Radiology Procedure Note  Procedure: [place] CORE BX LIVER LESION ADJ TO GB FOSSA    Complications: None  Estimated Blood Loss:  MIN  Findings: 93 G CORE X 2    M. Ruel Favors, MD

## 2023-04-09 NOTE — Sedation Documentation (Signed)
Gelfoam to tract per  Dr Miles Costain, dry pressure held x5 minutes,bandaid applied

## 2023-04-09 NOTE — Evaluation (Signed)
Physical Therapy Evaluation/ discharge Patient Details Name: [name] MRN: [id number] DOB: [birth year] Today's Date: 04/09/2023  History of Present Illness  82 yo female admitted 7/11 with jaundice. 7/12 MRCP. 7/14 ERCP and liver bx. PMHx: HTN, DM, CAD, PAD, cholecystectomy, neuropathy, bil transmet amputations  Clinical Impression  Pt very pleasant and reports playing pool several times  a week with her daughter and enjoying reading. PT has cane, rW and rollator at home and uses cane for balance due to bil transmet amputations. Pt able to dress independently this date, walked in hall and performing mobility as she does at baseline without current need for therapy intervention. Will sign off with pt aware and agreeable.         Assistance Recommended at Discharge PRN  If plan is discharge home, recommend the following:  Can travel by private vehicle           Equipment Recommendations None recommended by PT  Recommendations for Other Services       Functional Status Assessment Patient has not had a recent decline in their functional status     Precautions / Restrictions Precautions Precautions: Fall      Mobility  Bed Mobility Overal bed mobility: Independent                  Transfers Overall transfer level: Independent                      Ambulation/Gait Ambulation/Gait assistance: Modified independent (Device/Increase time) Gait Distance (Feet): 200 Feet Assistive device: None Gait Pattern/deviations: Step-through pattern   Gait velocity interpretation: >2.62 ft/sec, indicative of community ambulatory   General Gait Details: pt with slightly unsteady gait reaching for rail intermittently due to bil transmet. does have DME at home and reports cane use in the community or scooter when available at stores, no hx of falling  Stairs            Wheelchair Mobility     Tilt Bed    Modified Rankin (Stroke Patients Only)        Balance Overall balance assessment: Mild deficits observed, not formally tested                                           Pertinent Vitals/Pain Pain Assessment Pain Assessment: No/denies pain    Home Living Family/patient expects to be discharged to:: Private residence Living Arrangements: Children Available Help at Discharge: Family;Available PRN/intermittently Type of Home: House Home Access: Level entry       Home Layout: One level Home Equipment: Agricultural consultant (2 wheels);Rollator (4 wheels);Cane - single point;BSC/3in1 Additional Comments: has shoe prosthetics but does not use them since they rub and irritate her skin    Prior Function Prior Level of Function : Independent/Modified Independent                     Hand Dominance        Extremity/Trunk Assessment   Upper Extremity Assessment Upper Extremity Assessment: Overall WFL for tasks assessed    Lower Extremity Assessment Lower Extremity Assessment: Overall WFL for tasks assessed    Cervical / Trunk Assessment Cervical / Trunk Assessment: Normal  Communication   Communication: No difficulties  Cognition Arousal/Alertness: Awake/alert Behavior During Therapy: WFL for tasks assessed/performed Overall Cognitive Status: Within Functional Limits for tasks assessed  General Comments      Exercises     Assessment/Plan    PT Assessment Patient does not need any further PT services  PT Problem List         PT Treatment Interventions      PT Goals (Current goals can be found in the Care Plan section)  Acute Rehab PT Goals PT Goal Formulation: All assessment and education complete, DC therapy    Frequency       Co-evaluation               AM-PAC PT "6 Clicks" Mobility  Outcome Measure Help needed turning from your back to your side while in a flat bed without using bedrails?: None Help needed moving  from lying on your back to sitting on the side of a flat bed without using bedrails?: None Help needed moving to and from a bed to a chair (including a wheelchair)?: None Help needed standing up from a chair using your arms (e.g., wheelchair or bedside chair)?: None Help needed to walk in hospital room?: None Help needed climbing 3-5 steps with a railing? : None 6 Click Score: 24    End of Session   Activity Tolerance: Patient tolerated treatment well Patient left: in bed;with call bell/phone within reach Nurse Communication: Mobility status PT Visit Diagnosis: Other abnormalities of gait and mobility (R26.89)    Time: 6073-7106 PT Time Calculation (min) (ACUTE ONLY): 9 min   Charges:   PT Evaluation $PT Eval Low Complexity: 1 Low   PT General Charges $$ ACUTE PT VISIT: 1 Visit         Merryl Hacker, PT Acute Rehabilitation Services Office: [phone]   Enedina Finner Tesa Meadors 04/09/2023, 1:29 PM

## 2023-04-10 LAB — CANCER ANTIGEN 19-9: CA 19-9: 9510 U/mL — ABNORMAL HIGH (ref 0–35)

## 2023-04-11 ENCOUNTER — Ambulatory Visit: Payer: Medicare Other | Admitting: Podiatry

## 2023-04-12 LAB — SURGICAL PATHOLOGY

## 2023-04-15 DIAGNOSIS — C23 Malignant neoplasm of gallbladder: Secondary | ICD-10-CM | POA: Diagnosis not present

## 2023-04-15 DIAGNOSIS — K831 Obstruction of bile duct: Secondary | ICD-10-CM | POA: Diagnosis not present

## 2023-04-17 DIAGNOSIS — C23 Malignant neoplasm of gallbladder: Secondary | ICD-10-CM | POA: Diagnosis not present

## 2023-04-18 ENCOUNTER — Encounter: Payer: Self-pay | Admitting: Hematology

## 2023-04-18 ENCOUNTER — Inpatient Hospital Stay: Payer: Medicare Other

## 2023-04-18 ENCOUNTER — Other Ambulatory Visit: Payer: Self-pay

## 2023-04-18 ENCOUNTER — Other Ambulatory Visit: Payer: Self-pay | Admitting: Cardiovascular Disease

## 2023-04-18 ENCOUNTER — Inpatient Hospital Stay: Payer: Medicare Other | Attending: Hematology | Admitting: Hematology

## 2023-04-18 ENCOUNTER — Other Ambulatory Visit: Payer: Self-pay | Admitting: Vascular Surgery

## 2023-04-18 VITALS — BP 147/61 | HR 87 | Temp 98.9°F | Resp 15 | Ht 65.0 in | Wt 139.7 lb

## 2023-04-18 DIAGNOSIS — C23 Malignant neoplasm of gallbladder: Secondary | ICD-10-CM | POA: Insufficient documentation

## 2023-04-18 DIAGNOSIS — I10 Essential (primary) hypertension: Secondary | ICD-10-CM | POA: Insufficient documentation

## 2023-04-18 DIAGNOSIS — E119 Type 2 diabetes mellitus without complications: Secondary | ICD-10-CM | POA: Insufficient documentation

## 2023-04-18 DIAGNOSIS — C221 Intrahepatic bile duct carcinoma: Secondary | ICD-10-CM | POA: Insufficient documentation

## 2023-04-18 MED ORDER — LIDOCAINE-PRILOCAINE 2.5-2.5 % EX CREA
TOPICAL_CREAM | CUTANEOUS | 3 refills | Status: DC
Start: 2023-04-18 — End: 2023-11-13

## 2023-04-18 MED ORDER — PROCHLORPERAZINE MALEATE 10 MG PO TABS
10.0000 mg | ORAL_TABLET | Freq: Four times a day (QID) | ORAL | 1 refills | Status: DC | PRN
Start: 2023-04-18 — End: 2023-11-13

## 2023-04-18 MED ORDER — ONDANSETRON HCL 8 MG PO TABS: 8.0000 mg | ORAL_TABLET | Freq: Three times a day (TID) | ORAL | 1 refills | Status: DC | PRN

## 2023-04-18 NOTE — Progress Notes (Signed)
I met with [name] and her daughter after  her consultation with Dr Mosetta Putt.  I explained my role as a nurse navigator and provided my contact information. I explained the services provided at [facility] and provided written information.  I explained the alight grant and let  her know she will receive more information about the grant at the time of her chemo education class. Dr Freida Busman will be placing her port a cath and has explained the procedure to her.  I showed a sample of the port a cath.  I told her that she will be scheduled for chemotherapy education class prior to receiving chemotherapy.  I told her our schedulers will call her with those appts. I told her that Dr Eliot Ford office will call her to schedule port placement and that central scheduling will call to schedule the Chest CT scan.   All questions were answered. She verbalized understanding.

## 2023-04-18 NOTE — Progress Notes (Signed)
[facility]   Telephone:([phone] [phone]   Clinic New Consult Note   Patient Care Team: Shon Hale, MD as PCP - General (Family Medicine) Runell Gess, MD as PCP - Cardiology (Cardiology)  Date of Service:  04/18/2023   CHIEF COMPLAINTS/PURPOSE OF CONSULTATION:  Cholangiocarcinoma of liver [facility])   REFERRING PHYSICIAN:  Dimple Nanas, MD   ASSESSMENT & PLAN:  [name] is a 82 y.o.  female with a history of   1. 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 -She underwent laparoscopic attempted cholecystectomy but was not able to be resected due to adhesions to due to them in the colon. -She developed jaundice in June 2024, status post ERCP and stent placement in [place].  CT and MRI scan showed a liver mass adjacent to the gallbladder fossae, and a biopsy confirmed adenocarcinoma.  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  Unfortunately this was felt to be not resectable by Dr. Freida Busman. -CT of abdomen and pelvis otherwise negative for nodal or distant metastasis.  I will obtain a CT chest to complete staging -I discussed systemic therapy options.  I discussed the standard of first-line chemotherapy with cisplatin, gemcitabine and durvalumab on day 1 and 8 every 21 days.  She is 82 year old, has multiple comorbidities, but she is very functional, lives independently and remains active, and wants to be aggressive to fight his cancer.  So I will offer her first-line cisplatin, gemcitabine  every 14 days and durvalumab every 28 days.  If she responded well, will stop chemo after 4 to 6 months of treatment, and changed to maintenance therapy. --Chemotherapy consent: Side effects including but does not  not limited to, fatigue, nausea, vomiting, diarrhea, hair loss, neuropathy, fluid retention, renal and kidney dysfunction, neutropenic fever, needed for blood transfusion, bleeding, were discussed with patient in great detail. She agrees to proceed. -Patient understands that chemotherapy is palliative, for disease control and prolong her life, her disease is unlikely curable. -Will also request NexGen or sequencing Tempus on her liver biopsy and blood, to see if she is a candidate for targeted therapy.  If she does, that will be likely served as second line therapy. Pt agrees.  -Will arrange chemo class and a port placement by Dr. Freida Busman.  2. DM, HTN, CAD, PAD -Follow-up with PCP and other specialists -Continue medication, -Will monitor her blood glucose and blood pressure closely during her chemotherapy    PLAN:  -Tempus Test on her liver biopsy  -Dicussed Chemotherapy three drug regiment -Recommend CT Chest to complete staging -reviewed her case in the conference next week  -port placement by Dr. Freida Busman  -chemo education -Looking to start treatment in 2 wks  Oncology History  Gallbladder cancer (HCC)  04/04/2023 Imaging    IMPRESSION: 1. Ill-defined hypodensity in the region of the gallbladder fossa appears similar to the prior study given lack of contrast. This would be better evaluated with dedicated MRI. 2. Nonobstructing right renal calculus. 3. Large amount of stool throughout the colon. 4. Stable indeterminate lucent areas in the vertebral bodies of the lumbar spine. 5. Severe atherosclerotic disease.     04/09/2023 Pathology Results     FINAL MICROSCOPIC DIAGNOSIS:   A. LIVER, ADJACENT TO GALLBLADDER FOSSA, BIOPSY:  -  Moderately differentiated adenocarcinoma    04/09/2023 Cancer Staging   Staging form: Distal Bile Duct, AJCC 8th Edition - Clinical stage from 04/09/2023: Stage IIIB (cT4, cN0, cM0) - Signed by Malachy Mood, MD on 04/18/2023 Total positive nodes:  0 Histologic grade (G): G2 Histologic grading system: 3 grade system   04/18/2023 Initial Diagnosis   Cholangiocarcinoma of liver (HCC)   05/02/2023 -  Chemotherapy   Patient is on Treatment Plan : BILIARY TRACT Cisplatin + Gemcitabine D1,15 + Durvalumab (1500) D1 q28d / Durvalumab (1500) q28d        HISTORY OF PRESENTING ILLNESS:  [name] 82 y.o. female is a here because of Cholangiocarcinoma of liver (HCC)  The patient was referred by Dimple Nanas, MD . The patient presents to the clinic today accompanied by daughter.   Pt was jaundice, and she notice it within a few days. Was admitted to the hospital, had a stent put in and a biopsy. Since the procedure she is no longer jaundice.    Pt occasional has lower back pain. She has some bloating after eating. She reports that she feels fatigue since being release from the hospital. Before being hospitalized she had a lot of energy.   She has a PMHx of.... Arthritis Gall Bladder surgery Hypertension Diabetes Mellitus   Socially... widow Former smoker 3 children    REVIEW OF SYSTEMS:    Constitutional: (-)Denies fevers, chills or abnormal night sweats Eyes: (-) Denies blurriness of vision, double vision or watery eyes Ears, nose, mouth, throat, and face: Denies mucositis or sore throat Respiratory:(-) Denies cough, dyspnea or wheezes Cardiovascular: (-)Denies palpitation, chest discomfort or lower extremity swelling Gastrointestinal:  Denies nausea, heartburn or change in bowel habits Skin: (+)abnormal skin color(Jaundice) Lymphatics: (-) Denies new lymphadenopathy or easy bruising Neurological:(-) Denies numbness, tingling or new weaknesses Behavioral/Psych: (-) Mood is stable, no new changes  All other systems were reviewed with the patient and are negative.   MEDICAL HISTORY:  Past Medical History:  Diagnosis Date   Anemia    after CABG in june 2021   Arthritis    Cancer (HCC)    removal from nose -  MOSE procedure    Complication of anesthesia    VERSED- agitated, muscle spasms, "jerking" , frantic , (never had this occurence in the pas)    Coronary artery disease    Diabetes mellitus without complication (HCC)    Dysrhythmia    PVC's   GERD (gastroesophageal reflux disease)    Heart murmur    History of hiatal hernia    Hyperlipidemia    Hypertension 11/20/11   ECHO- EF>55% Borderline concentric left ventricular hypertrophy. There is a small calcified mass in the L:A near the LA appendage. No valvular masses seen with associated mitral annular calcification. LA Volume/ BSA27.4 ml/m2 No AS. Right ventricular systolic pressure is elevated at .   Myocardial infarction [facility])    June 2021   Neuromuscular disorder (HCC)    neuropathy in bilateral feet   Peripheral vascular disease (HCC)    Pneumonia    not hosp.    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  PTCAs with drug coated balloon Lt ant tibial artery and Lt popliteal artery  11/26/2019    SURGICAL HISTORY: Past Surgical History:  Procedure Laterality Date   ABDOMINAL AORTAGRAM  11/25/2019   ABDOMINAL AORTOGRAM W/LOWER EXTREMITY    ABDOMINAL AORTOGRAM N/A 06/15/2019   Procedure: ABDOMINAL AORTOGRAM;  Surgeon: Runell Gess, MD;  [facility];  Service: Cardiovascular;  Laterality: N/A;   ABDOMINAL AORTOGRAM W/LOWER EXTREMITY N/A 11/25/2019   Procedure: ABDOMINAL AORTOGRAM W/LOWER EXTREMITY;  Surgeon: Iran Ouch, MD;  [facility];  Service: Cardiovascular;  Laterality: N/A;  Lt leg   ABDOMINAL AORTOGRAM W/LOWER EXTREMITY N/A 08/01/2020   Procedure: ABDOMINAL AORTOGRAM W/LOWER EXTREMITY;  Surgeon: Runell Gess, MD;  [facility];  Service: Cardiovascular;  Laterality: N/A;   ABDOMINAL AORTOGRAM W/LOWER EXTREMITY N/A 04/28/2021   Procedure: ABDOMINAL AORTOGRAM W/LOWER EXTREMITY;  Surgeon: Sherren Kerns, MD;  [facility];  Service:  Cardiovascular;  Laterality: N/A;   ABDOMINAL AORTOGRAM W/LOWER EXTREMITY N/A 10/13/2021   Procedure: ABDOMINAL AORTOGRAM W/LOWER EXTREMITY;  Surgeon: Leonie Douglas, MD;  [facility];  Service: Cardiovascular;  Laterality: N/A;   ABDOMINAL AORTOGRAM W/LOWER EXTREMITY N/A 11/09/2022   Procedure: ABDOMINAL AORTOGRAM W/LOWER EXTREMITY;  Surgeon: Leonie Douglas, MD;  [facility];  Service: Cardiovascular;  Laterality: N/A;   ABDOMINAL AORTOGRAM W/LOWER EXTREMITY N/A 11/16/2022   Procedure: ABDOMINAL AORTOGRAM W/LOWER EXTREMITY;  Surgeon: Leonie Douglas, MD;  [facility];  Service: Cardiovascular;  Laterality: N/A;   AMPUTATION TOE Left 05/03/2021   Procedure: AMPUTATION OF THIRD LEFT  TOE;  Surgeon: Leonie Douglas, MD;  Location: [facility] OR;  Service: Vascular;  Laterality: Left;   APPENDECTOMY     APPLICATION OF WOUND VAC Right 07/21/2019   Procedure: Application Of Prevena Wound Vac Right Groin;  Surgeon: Nada Libman, MD;  Location: [facility] OR;  Service: Vascular;  Laterality: Right;   BILIARY BRUSHING  04/07/2023   Procedure: BILIARY BRUSHING;  Surgeon: Vida Rigger, MD;  Location: [facility] ENDOSCOPY;  Service: Gastroenterology;;   BILIARY STENT PLACEMENT  04/07/2023   Procedure: BILIARY STENT PLACEMENT;  Surgeon: Vida Rigger, MD;  Location: [facility] ENDOSCOPY;  Service: Gastroenterology;;   CARPAL TUNNEL RELEASE Left    CARPAL TUNNEL RELEASE Right    CESAREAN SECTION     x 2   CHOLECYSTECTOMY N/A 10/12/2022   Procedure: LAPAROSCOPIC PARTIAL FENESTRATING CHOLECYSTECTOMY;  Surgeon: Fritzi Mandes, MD;  [facility];  Service: General;  Laterality: N/A;   COLONOSCOPY     CORONARY ARTERY BYPASS GRAFT N/A 03/24/2020   Procedure: CORONARY ARTERY BYPASS GRAFTING (CABG) using LIMA to LAD (m); RIMA to RAMUS; Endoscopic Right Greater Saphenous Vein: SVG to Diag1; SVG to PLB (right); and SVG to PL (left).;  Surgeon: Linden Dolin, MD;  [facility];   Service: Open Heart Surgery;  Laterality: N/A;  BILATERAL IMA   ENDARTERECTOMY FEMORAL Right 07/21/2019   Procedure: RIGHT ENDARTERECTOMY FEMORAL WITH PATCH ANGIOPLASTY;  Surgeon: Nada Libman, MD;  [facility];  Service: Vascular;  Laterality: Right;   ENDARTERECTOMY FEMORAL Right 08/16/2020   Procedure: RIGHT FEMORAL ENDARTERECTOMY;  Surgeon: Maeola Harman, MD;  Location: [facility] OR;  Service: Vascular;  Laterality: Right;   ENDOVEIN HARVEST OF GREATER SAPHENOUS VEIN Right 03/24/2020   Procedure: ENDOVEIN HARVEST OF GREATER SAPHENOUS VEIN;  Surgeon: Linden Dolin, MD;  [facility];  Service: Open Heart Surgery;  Laterality: Right;   ERCP N/A 04/07/2023   Procedure: ENDOSCOPIC RETROGRADE CHOLANGIOPANCREATOGRAPHY (ERCP);  Surgeon: Vida Rigger, MD;  Location: [facility] ENDOSCOPY;  Service: Gastroenterology;  Laterality: N/A;   EYE SURGERY     cataract removal bilaterally   FEMORAL-POPLITEAL BYPASS GRAFT Right 08/16/2020   Procedure: BYPASS GRAFT FEMORAL-POPLITEAL ARTERY;  Surgeon: Maeola Harman, MD;  Location: [facility] OR;  Service: Vascular;  Laterality: Right;   FEMORAL-POPLITEAL BYPASS GRAFT Left 05/03/2021   Procedure: LEFT FEMORAL TO BELOW KNEE POPLITEAL ARTERY BYPASS GRAFTING WITH 6MMX80 PTFE GRAFT;  Surgeon: Leonie Douglas, MD;  [facility];  Service: Vascular;  Laterality: Left;   LEFT HEART CATH AND CORONARY ANGIOGRAPHY N/A 03/22/2020   Procedure: LEFT HEART CATH AND CORONARY ANGIOGRAPHY;  Surgeon: Swaziland, Peter M, MD;  Location: [facility] INVASIVE CV LAB;  Service: Cardiovascular;  Laterality: N/A;   LOWER EXTREMITY ANGIOGRAPHY Bilateral 06/15/2019   Procedure: Lower Extremity Angiography;  Surgeon: Runell Gess, MD;  Location: [facility] INVASIVE CV LAB;  Service: Cardiovascular;  Laterality: Bilateral;   LOWER EXTREMITY ANGIOGRAPHY Right 08/03/2019   Procedure: LOWER EXTREMITY ANGIOGRAPHY;  Surgeon: Runell Gess, MD;  [facility];  Service:  Cardiovascular;  Laterality: Right;   LOWER EXTREMITY ANGIOGRAPHY N/A 09/07/2021   Procedure: LOWER EXTREMITY ANGIOGRAPHY;  Surgeon: Cephus Shelling, MD;  [facility];  Service: Cardiovascular;  Laterality: N/A;   PATCH ANGIOPLASTY Right 07/21/2019   Procedure: Patch Angioplasty Right Femoral Artery;  Surgeon: Nada Libman, MD;  Location: [facility] OR;  Service: Vascular;  Laterality: Right;   PERIPHERAL INTRAVASCULAR LITHOTRIPSY  11/25/2019   Procedure: INTRAVASCULAR LITHOTRIPSY;  Surgeon: Iran Ouch, MD;  [facility];  Service: Cardiovascular;;  LT. SFA   PERIPHERAL VASCULAR ATHERECTOMY  08/03/2019   Procedure: PERIPHERAL VASCULAR ATHERECTOMY;  Surgeon: Runell Gess, MD;  Location: [facility] INVASIVE CV LAB;  Service: Cardiovascular;;  right SFA, right TP trunk   PERIPHERAL VASCULAR ATHERECTOMY  11/25/2019   Procedure: PERIPHERAL VASCULAR ATHERECTOMY;  Surgeon: Iran Ouch, MD;  [facility];  Service: Cardiovascular;;  Lt.  POPLITEAL and AT   PERIPHERAL VASCULAR BALLOON ANGIOPLASTY Left 06/15/2019   Procedure: PERIPHERAL VASCULAR BALLOON ANGIOPLASTY;  Surgeon: Runell Gess, MD;  [facility];  Service: Cardiovascular;  Laterality: Left;  SFA UNSUCCESSFUL UNABLE TO CROSS LESION   PERIPHERAL VASCULAR BALLOON ANGIOPLASTY  08/03/2019   Procedure: PERIPHERAL VASCULAR BALLOON ANGIOPLASTY;  Surgeon: Runell Gess, MD;  [facility];  Service: Cardiovascular;;  right SFA, Right TP trunk   PERIPHERAL VASCULAR BALLOON ANGIOPLASTY  09/07/2021   Procedure: PERIPHERAL VASCULAR BALLOON ANGIOPLASTY;  Surgeon: Cephus Shelling, MD;  [facility];  Service: Cardiovascular;;   PERIPHERAL VASCULAR BALLOON ANGIOPLASTY Right 10/13/2021   Procedure: PERIPHERAL VASCULAR BALLOON ANGIOPLASTY;  Surgeon: Leonie Douglas, MD;  [facility];  Service: Cardiovascular;  Laterality:  Right;   PERIPHERAL VASCULAR BALLOON ANGIOPLASTY  11/09/2022   Procedure: PERIPHERAL VASCULAR BALLOON ANGIOPLASTY;  Surgeon: Leonie Douglas, MD;  [facility];  Service: Cardiovascular;;  fem-pop bypass and AT   REMOVAL OF STONES  04/07/2023   Procedure: REMOVAL OF STONES;  Surgeon: Vida Rigger, MD;  Location: [facility] ENDOSCOPY;  Service: Gastroenterology;;   Dennison Mascot  04/07/2023   Procedure: Dennison Mascot;  Surgeon: Vida Rigger, MD;  Location: [facility] ENDOSCOPY;  Service: Gastroenterology;;   TEE WITHOUT CARDIOVERSION N/A 03/24/2020   Procedure: TRANSESOPHAGEAL ECHOCARDIOGRAM (TEE);  Surgeon: Linden Dolin, MD;  Location: [facility] OR;  Service: Open Heart Surgery;  Laterality: N/A;   TONSILLECTOMY     and adenoidectomy   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/2019   Procedure: TRANSMETATARSAL AMPUTATION;  Surgeon: Vivi Barrack, DPM;  [facility];  Service: Podiatry;  Laterality:  Right;   TRANSMETATARSAL AMPUTATION Left 05/11/2021   Procedure: LEFT TRANSMETATARSAL AMPUTATION;  Surgeon: Maeola Harman, MD;  Location: [facility] OR;  Service: Vascular;  Laterality: Left;   TUBAL LIGATION      SOCIAL HISTORY: Social History   Socioeconomic History   Marital status: Widowed    Spouse name: Not on file   Number of children: 3   Years of education: Not on file   Highest education level: Not on file  Occupational History   Not on file  Tobacco Use   Smoking status: Former    Current packs/day: 0.00    Types: Cigarettes    Quit date: 03/31/1972    Years since quitting: 51.0   Smokeless tobacco: Never  Vaping Use   Vaping status: Never Used  Substance and Sexual Activity   Alcohol use: No   Drug use: No   Sexual activity: Not on file  Other Topics Concern   Not on file  Social History Narrative   Not on file   Social Determinants of Health   Financial Resource Strain: Not on file  Food Insecurity: No Food Insecurity (04/05/2023)   Hunger Vital Sign     Worried About Running Out of Food in the Last Year: Never true    Ran Out of Food in the Last Year: Never true  Transportation Needs: No Transportation Needs (04/05/2023)   PRAPARE - Administrator, Civil Service (Medical): No    Lack of Transportation (Non-Medical): No  Physical Activity: Not on file  Stress: Not on file  Social Connections: Unknown (02/02/2022)   Received from [facility]   Social Network    Social Network: Not on file  Intimate Partner Violence: Not At Risk (04/05/2023)   Humiliation, Afraid, Rape, and Kick questionnaire    Fear of Current or Ex-Partner: No    Emotionally Abused: No    Physically Abused: No    Sexually Abused: No    FAMILY HISTORY: Family History  Problem Relation Age of Onset   Cancer - Prostate Father    Cancer - Colon Father    Stroke Mother    Hypertension Mother    Hyperlipidemia Mother    Melanoma Brother     ALLERGIES:  is allergic to versed [midazolam], augmentin [amoxicillin-pot clavulanate], bactrim [sulfamethoxazole-trimethoprim], demerol [meperidine], meperidine hcl, scopolamine, and tramadol.  MEDICATIONS:  Current Outpatient Medications  Medication Sig Dispense Refill   aspirin EC 81 MG tablet Take 81 mg by mouth at bedtime. Swallow whole.     atorvastatin (LIPITOR) 80 MG tablet TAKE 1 TABLET BY MOUTH ONCE DAILY. MUST  KEEP  UPCOMING  APPOINTMENT  FOR  FUTURE  REFILLS 90 tablet 0   brimonidine (ALPHAGAN) 0.2 % ophthalmic solution Place 1 drop into the right eye 2 (two) times daily.     hydrochlorothiazide (HYDRODIURIL) 12.5 MG tablet Take 1 tablet by mouth once daily 90 tablet 0   Insulin Lispro Prot & Lispro (HUMALOG MIX 75/25 KWIKPEN) (75-25) 100 UNIT/ML Kwikpen INJECT 15 UNITS SUBCUTANEOUSLY ONCE DAILY WITH BREAKFAST (Patient taking differently: Inject 4-8 Units into the skin See admin instructions. Inject 8 units into the skin every morning and 4 units every evening.) 15 mL 0   Insulin Pen Needle (ULTRA-THIN II  MINI PEN NEEDLE) 31G X 5 MM MISC USE AS DIRECTED 50 each 2   JARDIANCE 25 MG TABS tablet TAKE 1 TABLET BY MOUTH ONCE DAILY BEFORE BREAKFAST 90 tablet 3   Lancets (ONETOUCH DELICA PLUS LANCET33G)  MISC Use to check blood sugars twice daily. (Patient taking differently: Use to check blood sugars  daily.) 100 each 5   metFORMIN (GLUCOPHAGE) 1000 MG tablet Take 1 tablet by mouth twice daily 180 tablet 3   metoprolol tartrate (LOPRESSOR) 50 MG tablet Take 1 tablet (50 mg total) by mouth 2 (two) times daily. 180 tablet 0   mupirocin ointment (BACTROBAN) 2 % Apply 1 Application topically 2 (two) times daily. (Patient taking differently: Apply 1 Application topically as needed.) 30 g 2   ONETOUCH VERIO test strip USE TO CHECK BLOOD SUGARS ONCE DAILY 100 each 2   pantoprazole (PROTONIX) 40 MG tablet Take 1 tablet by mouth once daily 90 tablet 3   Propylene Glycol (SYSTANE BALANCE) 0.6 % SOLN Place 1 drop into the left eye 3 (three) times daily as needed (dry/irritated eyes).     repaglinide (PRANDIN) 2 MG tablet Take 1 tab by breakfast and 1 tab before dinner (Patient taking differently: Take 2 mg by mouth daily.) 180 tablet 2   rivaroxaban (XARELTO) 2.5 MG TABS tablet Take 1 tablet by mouth twice daily 60 tablet 1   telmisartan (MICARDIS) 80 MG tablet Take 1 tablet by mouth once daily 90 tablet 3   Ascorbic Acid (VITA-C PO) Take 1 tablet by mouth daily.     Cholecalciferol (VITAMIN D3 GUMMIES PO) Take 2 tablets by mouth at bedtime. (Patient not taking: Reported on 04/18/2023)     CINNAMON PO Take 1 capsule by mouth in the morning, at noon, and at bedtime. (Patient not taking: Reported on 04/18/2023)     Cyanocobalamin (B-12 PO) Take 1,000 mcg by mouth daily with supper. (Patient not taking: Reported on 04/18/2023)     lidocaine-prilocaine (EMLA) cream Apply to affected area once 30 g 3   Menatetrenone (VITAMIN K2) 100 MCG TABS Take 100 mcg by mouth at bedtime. (Patient not taking: Reported on 04/18/2023)      ondansetron (ZOFRAN) 8 MG tablet Take 1 tablet (8 mg total) by mouth every 8 (eight) hours as needed for nausea or vomiting. Start on the third day after cisplatin. 30 tablet 1   prochlorperazine (COMPAZINE) 10 MG tablet Take 1 tablet (10 mg total) by mouth every 6 (six) hours as needed (Nausea or vomiting). 30 tablet 1   Current Facility-Administered Medications  Medication Dose Route Frequency Provider Last Rate Last Admin   sodium chloride flush (NS) 0.9 % injection 3 mL  3 mL Intravenous Q12H Runell Gess, MD        PHYSICAL EXAMINATION: ECOG PERFORMANCE STATUS: 1 - Symptomatic but completely ambulatory  Vitals:   04/18/23 1508  BP: (!) 147/61  Pulse: 87  Resp: 15  Temp: 98.9 F (37.2 C)  SpO2: 99%   Filed Weights   04/18/23 1508  Weight: 139 lb 11.2 oz (63.4 kg)     GENERAL:alert, no distress and comfortable SKIN: skin color normal, no rashes or significant lesions EYES: normal, Conjunctiva are pink and non-injected, sclera clear  NEURO: alert & oriented x 3 with fluent speech ABDOMEN:(-)abdomen soft, (-)non-tender and normal bowel sounds LABORATORY DATA:  I have reviewed the data as listed    Latest Ref Rng & Units 04/09/2023    3:01 AM 04/08/2023    7:32 AM 04/07/2023    4:26 AM  CBC  WBC 4.0 - 10.5 K/uL 9.3  8.4  6.5   Hemoglobin 12.0 - 15.0 g/dL 9.0  9.4  8.9   Hematocrit 36.0 - 46.0 % 26.5  27.6  27.4   Platelets 150 - 400 K/uL 174  175  143        Latest Ref Rng & Units 04/09/2023    3:01 AM 04/08/2023    7:32 AM 04/07/2023    4:26 AM  CMP  Glucose 70 - 99 mg/dL 161  096  045   BUN 8 - 23 mg/dL 18  21  17    Creatinine 0.44 - 1.00 mg/dL 4.09  8.11  9.14   Sodium 135 - 145 mmol/L 139  137  138   Potassium 3.5 - 5.1 mmol/L 3.5  3.8  3.4   Chloride 98 - 111 mmol/L 112  111  113   CO2 22 - 32 mmol/L 20  19  17    Calcium 8.9 - 10.3 mg/dL 9.1  9.1  8.7   Total Protein 6.5 - 8.1 g/dL 5.2  5.3  5.0   Total Bilirubin 0.3 - 1.2 mg/dL 3.6  5.6  78.2    Alkaline Phos 38 - 126 U/L 489  498  540   AST 15 - 41 U/L 81  109  160   ALT 0 - 44 U/L 89  103  112      RADIOGRAPHIC STUDIES: I have personally reviewed the radiological images as listed and agreed with the findings in the report. US BIOPSY (LIVER)  Result Date: 04/09/2023 INDICATION: Liver lesion, jaundice EXAM: ULTRASOUND CENTRAL LIVER LESION CORE BIOPSY MEDICATIONS: 1% lidocaine local ANESTHESIA/SEDATION: Moderate (conscious) sedation was employed during this procedure. A total of Versed 0 mg and Fentanyl 100 mcg was administered intravenously by the radiology nurse. Total intra-service moderate Sedation Time: 0 minutes. The patient's level of consciousness and vital signs were monitored continuously by radiology nursing throughout the procedure under my direct supervision. COMPLICATIONS: None immediate. PROCEDURE: Informed written consent was obtained from the patient after a thorough discussion of the procedural risks, benefits and alternatives. All questions were addressed. Maximal Sterile Barrier Technique was utilized including caps, mask, sterile gowns, sterile gloves, sterile drape, hand hygiene and skin antiseptic. A timeout was performed prior to the initiation of the procedure. Previous imaging reviewed. Preliminary ultrasound performed. The right upper quadrant central liver lesion adjacent to the gallbladder fossa was localized and marked for biopsy. Under sterile conditions and local anesthesia, the 17 gauge guide was advanced to the ill-defined hypoechoic lesion. Needle position confirmed with ultrasound. 2 18 gauge core biopsy obtained. These were intact and non fragmented. Samples were placed in formalin. Needle tract occluded with Gel-Foam. Images obtained for documentation. Postprocedure imaging demonstrates no hemorrhage or hematoma. Patient tolerated biopsy well. IMPRESSION: Successful ultrasound-guided core biopsy of the right upper quadrant central liver lesion. Electronically  Signed   By: Judie Petit.  Shick M.D.   On: 04/09/2023 10:43   DG ERCP  Result Date: 04/07/2023 CLINICAL DATA:  ERCP with stone removal and biliary stent placement. History of cholecystectomy performed 10/12/2022. EXAM: ERCP TECHNIQUE: Multiple spot images obtained with the fluoroscopic device and submitted for interpretation post-procedure. FLUOROSCOPY TIME: FLUOROSCOPY TIME 5 minutes, 4 seconds (73.3 mGy) COMPARISON:  MRCP-04/05/2023; CT abdomen pelvis-04/04/2023 FINDINGS: Five spot intraoperative fluoroscopic images of the right upper abdominal quadrant during ERCP are provided for review Initial image demonstrates an ERCP probe overlying the right upper abdominal quadrant Subsequent images demonstrate selective cannulation and opacification of the common bile duct. Subsequent images demonstrate insufflation of a balloon within the mid aspect of the CBD with subsequent biliary sweeping and presumed sphincterotomy. Completion image demonstrates placement of a  biliary stent within the CBD. There is minimal opacification of the intrahepatic biliary tree which appears nondilated. There is opacification of the cystic duct remnant. Contrast injection demonstrates opacification of the questioned lesion within segment V of the liver adjacent to the gallbladder fossa. There is no definitive opacification of the pancreatic duct. IMPRESSION: 1. ERCP with biliary sweeping and stent placement as above. 2. 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 These images were submitted for radiologic interpretation only. Please see the procedural report for the amount of contrast and the fluoroscopy time utilized. Electronically Signed   By: Simonne Come M.D.   On:  04/07/2023 11:33   MR ABDOMEN MRCP W WO CONTAST  Result Date: 04/05/2023 CLINICAL DATA:  82 year old female with history of jaundice. Status post laparoscopic cholecystectomy 10/12/2022. EXAM: MRI ABDOMEN WITHOUT AND WITH CONTRAST (INCLUDING MRCP) TECHNIQUE: Multiplanar multisequence MR imaging of the abdomen was performed both before and after the administration of intravenous contrast. Heavily T2-weighted images of the biliary and pancreatic ducts were obtained, and three-dimensional MRCP images were rendered by post processing. CONTRAST:  6mL GADAVIST GADOBUTROL 1 MMOL/ML IV SOLN COMPARISON:  Abdominal MRI 08/30/2022. CT of the abdomen and pelvis 04/04/2023. FINDINGS: Lower chest: Susceptibility artifact in the lower sternum from indwelling median sternotomy wires. Hepatobiliary: Patient is status post cholecystectomy. When compared to the prior examination, there is increasingly prominent mass-like architectural distortion in the liver adjacent to the gallbladder fossa centered predominantly in segment 5. This areas heterogeneous in signal intensity on T1 and T2 weighted images, but generally centrally T1 hypointense with heterogeneous T2 hyperintensity (including central areas that appear cystic). There is extensive hyperenhancement during arterial phase imaging in the surrounding hepatic parenchyma, but the central nidus of the lesion is estimated to measure approximately 3.5 x 2.5 x 2.0 cm on axial image 78 of series 1001 and coronal image 83 of series 12, where the lesion appears centrally hypovascular with a peripheral rim of enhancement. The peripheral hypervascularity appears to resolve rapidly on more delayed post gadolinium imaging (although subtraction imaging demonstrates persistent peripheral enhancement), and the lesion appears hypovascular centrally on delayed imaging. Diffusion imaging demonstrates heterogeneous areas of diffusion restriction associated with this lesion. MRCP images  demonstrates some mild intrahepatic biliary ductal dilatation. There is signal void in the region of the common hepatic duct (coronal MRCP image 68 of series 6), which could be related to artifact from cholecystectomy clips. Immediately distal to this the common hepatic duct measures 5 mm. Cystic duct remnant is not dilated. Common bile duct is normal in caliber measuring only 4 mm in the porta hepatis. No filling defects are noted in the common bile duct to suggest choledocholithiasis. Pancreas: No pancreatic mass. No pancreatic ductal dilatation. No pancreatic or peripancreatic fluid collections or inflammatory changes. Spleen:  Unremarkable. Adrenals/Urinary Tract: Subcentimeter T1 hypointense, T2 hyperintense, nonenhancing lesion in the lower pole of the left kidney, compatible with a tiny simple cyst (Bosniak class 1, no imaging follow-up recommended). Right kidney and bilateral adrenal glands are normal in appearance. No hydroureteronephrosis in the visualized portions of the abdomen. Stomach/Bowel: Visualized portions are unremarkable. Vascular/Lymphatic: No aneurysm identified in the visualized abdominal vasculature. No lymphadenopathy noted in the abdomen. Other: No significant volume of ascites noted in the visualized portions  of the peritoneal cavity. Musculoskeletal: No aggressive appearing osseous lesions are noted in the visualized portions of the skeleton. IMPRESSION: 1. Unusual but increasingly aggressive and mass-like appearing lesion in segment 5 of the liver adjacent to the gallbladder fossa, concerning for potential neoplasm. Tissue sampling should be considered to establish a tissue diagnosis. Further evaluation with PET-CT should also be considered. 2. Mild intrahepatic biliary ductal dilatation, new compared to the preoperative study before laparoscopic cholecystectomy. Today's study demonstrates signal void in the region of the common hepatic duct. This could be artifactual and related to  cholecystectomy clips, however, the possibility of postoperative stricture is not excluded. Correlation with liver function tests is recommended. Lack of common bile duct dilatation is unusual in this post cholecystectomy patient, and could suggest narrowing of the common hepatic duct. ERCP could be considered for further evaluation if clinically appropriate. 3. Additional incidental findings, as above. These results will be called to the ordering clinician or representative by the Radiologist Assistant, and communication documented in the PACS or [employer]. Electronically Signed   By: Trudie Reed M.D.   On: 04/05/2023 10:20   MR 3D Recon At Scanner  Result Date: 04/05/2023 CLINICAL DATA:  82 year old female with history of jaundice. Status post laparoscopic cholecystectomy 10/12/2022. EXAM: MRI ABDOMEN WITHOUT AND WITH CONTRAST (INCLUDING MRCP) TECHNIQUE: Multiplanar multisequence MR imaging of the abdomen was performed both before and after the administration of intravenous contrast. Heavily T2-weighted images of the biliary and pancreatic ducts were obtained, and three-dimensional MRCP images were rendered by post processing. CONTRAST:  6mL GADAVIST GADOBUTROL 1 MMOL/ML IV SOLN COMPARISON:  Abdominal MRI 08/30/2022. CT of the abdomen and pelvis 04/04/2023. FINDINGS: Lower chest: Susceptibility artifact in the lower sternum from indwelling median sternotomy wires. Hepatobiliary: Patient is status post cholecystectomy. When compared to the prior examination, there is increasingly prominent mass-like architectural distortion in the liver adjacent to the gallbladder fossa centered predominantly in segment 5. This areas heterogeneous in signal intensity on T1 and T2 weighted images, but generally centrally T1 hypointense with heterogeneous T2 hyperintensity (including central areas that appear cystic). There is extensive hyperenhancement during arterial phase imaging in the surrounding hepatic  parenchyma, but the central nidus of the lesion is estimated to measure approximately 3.5 x 2.5 x 2.0 cm on axial image 78 of series 1001 and coronal image 83 of series 12, where the lesion appears centrally hypovascular with a peripheral rim of enhancement. The peripheral hypervascularity appears to resolve rapidly on more delayed post gadolinium imaging (although subtraction imaging demonstrates persistent peripheral enhancement), and the lesion appears hypovascular centrally on delayed imaging. Diffusion imaging demonstrates heterogeneous areas of diffusion restriction associated with this lesion. MRCP images demonstrates some mild intrahepatic biliary ductal dilatation. There is signal void in the region of the common hepatic duct (coronal MRCP image 68 of series 6), which could be related to artifact from cholecystectomy clips. Immediately distal to this the common hepatic duct measures 5 mm. Cystic duct remnant is not dilated. Common bile duct is normal in caliber measuring only 4 mm in the porta hepatis. No filling defects are noted in the common bile duct to suggest choledocholithiasis. Pancreas: No pancreatic mass. No pancreatic ductal dilatation. No pancreatic or peripancreatic fluid collections or inflammatory changes. Spleen:  Unremarkable. Adrenals/Urinary Tract: Subcentimeter T1 hypointense, T2 hyperintense, nonenhancing lesion in the lower pole of the left kidney, compatible with a tiny simple cyst (Bosniak class 1, no imaging follow-up recommended). Right kidney and bilateral adrenal glands are normal in appearance.  No hydroureteronephrosis in the visualized portions of the abdomen. Stomach/Bowel: Visualized portions are unremarkable. Vascular/Lymphatic: No aneurysm identified in the visualized abdominal vasculature. No lymphadenopathy noted in the abdomen. Other: No significant volume of ascites noted in the visualized portions of the peritoneal cavity. Musculoskeletal: No aggressive appearing  osseous lesions are noted in the visualized portions of the skeleton. IMPRESSION: 1. Unusual but increasingly aggressive and mass-like appearing lesion in segment 5 of the liver adjacent to the gallbladder fossa, concerning for potential neoplasm. Tissue sampling should be considered to establish a tissue diagnosis. Further evaluation with PET-CT should also be considered. 2. Mild intrahepatic biliary ductal dilatation, new compared to the preoperative study before laparoscopic cholecystectomy. Today's study demonstrates signal void in the region of the common hepatic duct. This could be artifactual and related to cholecystectomy clips, however, the possibility of postoperative stricture is not excluded. Correlation with liver function tests is recommended. Lack of common bile duct dilatation is unusual in this post cholecystectomy patient, and could suggest narrowing of the common hepatic duct. ERCP could be considered for further evaluation if clinically appropriate. 3. Additional incidental findings, as above. These results will be called to the ordering clinician or representative by the Radiologist Assistant, and communication documented in the PACS or [employer]. Electronically Signed   By: Trudie Reed M.D.   On: 04/05/2023 10:20   CT ABDOMEN PELVIS WO CONTRAST  Result Date: 04/04/2023 CLINICAL DATA:  Jaundice, concern for malignancy. EXAM: CT ABDOMEN AND PELVIS WITHOUT CONTRAST TECHNIQUE: Multidetector CT imaging of the abdomen and pelvis was performed following the standard protocol without IV contrast. RADIATION DOSE REDUCTION: This exam was performed according to the departmental dose-optimization program which includes automated exposure control, adjustment of the mA and/or kV according to patient size and/or use of iterative reconstruction technique. COMPARISON:  MRI abdomen 08/30/2022.  CT abdomen 08/14/2022. FINDINGS: Lower chest: No acute abnormality. Hepatobiliary: There some  ill-defined heterogeneous hypodensity in the region of the gallbladder fossa measuring 2.4 x 3.0 cm this appears similar to the prior study given lack of contrast. Gallbladder is surgically absent. There is no biliary ductal dilatation. Pancreas: Unremarkable. No pancreatic ductal dilatation or surrounding inflammatory changes. Spleen: Normal in size without focal abnormality. Adrenals/Urinary Tract: There is a 3 mm calculus in the right kidney. There is no hydronephrosis or perinephric fat stranding. Left kidney, ureters and bladder are normal. Stomach/Bowel: Stomach is within normal limits. No evidence of bowel wall thickening, distention, or inflammatory changes. The appendix is not seen. There is a large amount of stool throughout colon. Vascular/Lymphatic: There severe atherosclerotic calcifications of the aorta, iliac arteries and branch vessels. There are no enlarged lymph nodes identified. Vascular stents are seen in the femoral regions along with postsurgical change and scarring bilaterally. Reproductive: Uterus and bilateral adnexa are unremarkable. Other: No abdominal wall hernia or abnormality. No abdominopelvic ascites. Musculoskeletal: There are few scattered rounded lucent areas in the vertebral bodies particularly at L2. These are similar to prior. IMPRESSION: 1. Ill-defined hypodensity in the region of the gallbladder fossa appears similar to the prior study given lack of contrast. This would be better evaluated with dedicated MRI. 2. Nonobstructing right renal calculus. 3. Large amount of stool throughout the colon. 4. Stable indeterminate lucent areas in the vertebral bodies of the lumbar spine. 5. Severe atherosclerotic disease. Aortic Atherosclerosis (ICD10-I70.0). Electronically Signed   By: Darliss Cheney M.D.   On: 04/04/2023 23:19     Orders Placed This Encounter  Procedures   CT Chest  Wo Contrast    Standing Status:   Future    Standing Expiration Date:   04/17/2024    Order Specific  Question:   Preferred imaging location?    Answer:   [facility]    Order Specific Question:   Release to patient    Answer:   Immediate [1]   CBC with Differential (Cancer Center Only)    Standing Status:   Future    Standing Expiration Date:   05/01/2024   CMP (Cancer Center only)    Standing Status:   Future    Standing Expiration Date:   05/01/2024   T4    Standing Status:   Future    Standing Expiration Date:   05/01/2024   TSH    Standing Status:   Future    Standing Expiration Date:   05/01/2024   Magnesium    Standing Status:   Future    Standing Expiration Date:   05/01/2024   CBC with Differential (Cancer Center Only)    Standing Status:   Future    Standing Expiration Date:   05/15/2024   CMP (Cancer Center only)    Standing Status:   Future    Standing Expiration Date:   05/15/2024   T4    Standing Status:   Future    Standing Expiration Date:   05/15/2024   TSH    Standing Status:   Future    Standing Expiration Date:   05/15/2024   Magnesium    Standing Status:   Future    Standing Expiration Date:   05/15/2024   CBC with Differential (Cancer Center Only)    Standing Status:   Future    Standing Expiration Date:   05/29/2024   CMP (Cancer Center only)    Standing Status:   Future    Standing Expiration Date:   05/29/2024   Magnesium    Standing Status:   Future    Standing Expiration Date:   05/29/2024   CBC with Differential (Cancer Center Only)    Standing Status:   Future    Standing Expiration Date:   06/12/2024   CMP (Cancer Center only)    Standing Status:   Future    Standing Expiration Date:   06/12/2024   T4    Standing Status:   Future    Standing Expiration Date:   06/12/2024   TSH    Standing Status:   Future    Standing Expiration Date:   06/12/2024   Magnesium    Standing Status:   Future    Standing Expiration Date:   06/12/2024    All questions were answered. The patient knows to call the clinic with any problems, questions or concerns. The  total time spent in the appointment was 75 minutes.     Malachy Mood, MD 04/18/2023 5:08 PM  I, Monica Martinez am acting as scribe for Malachy Mood, MD.   I have reviewed the above documentation for accuracy and completeness, and I agree with the above.

## 2023-04-18 NOTE — Progress Notes (Signed)
START OFF PATHWAY REGIMEN - Other ° ° °[id number]:Cisplatin IV D1,8 + Durvalumab 1,500 mg IV D1 + Gemcitabine IV D1,8 q21 Days for up to 8 Cycles Followed by Durvalumab 1,500 mg IV D1 q28 Days: °  Cycles 1 through up to 8: A cycle is every 21 days: °    Durvalumab  °    Gemcitabine  °    Cisplatin  °  Cycles 9 and beyond: A cycle is every 28 days: °    Durvalumab  ° °**Always confirm dose/schedule in your pharmacy ordering system** ° °Patient Characteristics: °Intent of Therapy: °Non-Curative / Palliative Intent, Discussed with Patient °

## 2023-04-19 ENCOUNTER — Telehealth: Payer: Self-pay | Admitting: Hematology

## 2023-04-19 ENCOUNTER — Other Ambulatory Visit: Payer: Self-pay

## 2023-04-19 DIAGNOSIS — C23 Malignant neoplasm of gallbladder: Secondary | ICD-10-CM

## 2023-04-19 NOTE — Progress Notes (Signed)
NGS requested via [employer] on line portal.  Order: [id number].

## 2023-04-22 ENCOUNTER — Encounter (HOSPITAL_COMMUNITY): Payer: Self-pay

## 2023-04-22 ENCOUNTER — Ambulatory Visit (HOSPITAL_COMMUNITY)
Admission: RE | Admit: 2023-04-22 | Discharge: 2023-04-22 | Disposition: A | Discharge status: Home / Self Care | Payer: Medicare Other | Source: Ambulatory Visit | Attending: Hematology | Admitting: Hematology

## 2023-04-22 DIAGNOSIS — I700 Atherosclerosis of aorta: Secondary | ICD-10-CM | POA: Diagnosis not present

## 2023-04-22 DIAGNOSIS — C221 Intrahepatic bile duct carcinoma: Secondary | ICD-10-CM | POA: Diagnosis not present

## 2023-04-22 DIAGNOSIS — R918 Other nonspecific abnormal finding of lung field: Secondary | ICD-10-CM | POA: Diagnosis not present

## 2023-04-23 ENCOUNTER — Ambulatory Visit: Payer: Self-pay | Admitting: Surgery

## 2023-04-24 ENCOUNTER — Other Ambulatory Visit: Payer: Self-pay

## 2023-04-24 NOTE — Patient Instructions (Signed)
DUE TO COVID-19 ONLY TWO VISITORS  (aged 81 and older)  ARE ALLOWED TO COME WITH YOU AND STAY IN THE WAITING ROOM ONLY DURING PRE OP AND PROCEDURE.   **NO VISITORS ARE ALLOWED IN THE SHORT STAY AREA OR RECOVERY ROOM!!**  IF YOU WILL BE ADMITTED INTO THE HOSPITAL YOU ARE ALLOWED ONLY FOUR SUPPORT PEOPLE DURING VISITATION HOURS ONLY (7 AM -8PM)   The support person(s) must pass our screening, gel in and out, and wear a mask at all times, including in the patient's room. Patients must also wear a mask when staff or their support person are in the room. Visitors GUEST BADGE MUST BE WORN VISIBLY  One adult visitor may remain with you overnight and MUST be in the room by 8 P.M.     Your procedure is scheduled on: 04/29/23   Report to [facility] Main Entrance    Report to admitting at : 11:45 AM   Call this number if you have problems the morning of surgery [phone]   Do not eat food :After Midnight.   After Midnight you may have the following liquids until : 11:00 AM DAY OF SURGERY  Water Black Coffee (sugar ok, NO MILK/CREAM OR CREAMERS)  Tea (sugar ok, NO MILK/CREAM OR CREAMERS) regular and decaf                             Plain Jell-O (NO RED)                                           Fruit ices (not with fruit pulp, NO RED)                                     Popsicles (NO RED)                                                                  Juice: apple, WHITE grape, WHITE cranberry Sports drinks like Gatorade (NO RED)             FOLLOW ANY ADDITIONAL PRE OP INSTRUCTIONS YOU RECEIVED FROM YOUR SURGEON'S OFFICE!!!   Oral Hygiene is also important to reduce your risk of infection.                                    Remember - BRUSH YOUR TEETH THE MORNING OF SURGERY WITH YOUR REGULAR TOOTHPASTE  DENTURES WILL BE REMOVED PRIOR TO SURGERY PLEASE DO NOT APPLY "Poly grip" OR ADHESIVES!!!   Do NOT smoke after Midnight   Take these medicines the morning of surgery with A  SIP OF WATER: metoprolol,pantoprazole.  How to Manage Your Diabetes Before and After Surgery  Why is it important to control my blood sugar before and after surgery? Improving blood sugar levels before and after surgery helps healing and can limit problems. A way of improving blood sugar control is eating a healthy diet by:  Eating less sugar and  carbohydrates  Increasing activity/exercise  Talking with your doctor about reaching your blood sugar goals High blood sugars (greater than 180 mg/dL) can raise your risk of infections and slow your recovery, so you will need to focus on controlling your diabetes during the weeks before surgery. Make sure that the doctor who takes care of your diabetes knows about your planned surgery including the date and location.  How do I manage my blood sugar before surgery? Check your blood sugar at least 4 times a day, starting 2 days before surgery, to make sure that the level is not too high or low. Check your blood sugar the morning of your surgery when you wake up and every 2 hours until you get to the Short Stay unit. If your blood sugar is less than 70 mg/dL, you will need to treat for low blood sugar: Do not take insulin. Treat a low blood sugar (less than 70 mg/dL) with  cup of clear juice (cranberry or apple), 4 glucose tablets, OR glucose gel. Recheck blood sugar in 15 minutes after treatment (to make sure it is greater than 70 mg/dL). If your blood sugar is not greater than 70 mg/dL on recheck, call [phone] for further instructions. Report your blood sugar to the short stay nurse when you get to Short Stay.  If you are admitted to the hospital after surgery: Your blood sugar will be checked by the staff and you will probably be given insulin after surgery (instead of oral diabetes medicines) to make sure you have good blood sugar levels. The goal for blood sugar control after surgery is 80-180 mg/dL.   WHAT DO I DO ABOUT MY DIABETES  MEDICATION?  Hold jardiance after: 04/25/23  THE NIGHT BEFORE SURGERY, DO NOT take the bedtime dose of lispro insulin.     THE MORNING OF SURGERY, DO NOT TAKE ANY ORAL DIABETIC MEDICATIONS DAY OF YOUR SURGERY  If your CBG is greater than 220 mg/dL, you may take  of your sliding scale  (correction) dose of insulin.                   You may not have any metal on your body including hair pins, jewelry, and body piercing             Do not wear make-up, lotions, powders, perfumes/cologne, or deodorant  Do not wear nail polish including gel and S&S, artificial/acrylic nails, or any other type of covering on natural nails including finger and toenails. If you have artificial nails, gel coating, etc. that needs to be removed by a nail salon please have this removed prior to surgery or surgery may need to be canceled/ delayed if the surgeon/ anesthesia feels like they are unable to be safely monitored.   Do not shave  48 hours prior to surgery.    Do not bring valuables to the hospital. [facility] IS NOT             RESPONSIBLE   FOR VALUABLES.   Contacts, glasses, or bridgework may not be worn into surgery.   Bring small overnight bag day of surgery.   DO NOT BRING YOUR HOME MEDICATIONS TO THE HOSPITAL. PHARMACY WILL DISPENSE MEDICATIONS LISTED ON YOUR MEDICATION LIST TO YOU DURING YOUR ADMISSION IN THE HOSPITAL!    Patients discharged on the day of surgery will not be allowed to drive home.  Someone NEEDS to stay with you for the first 24 hours after anesthesia.   Special Instructions:  Bring a copy of your healthcare power of attorney and living will documents         the day of surgery if you haven't scanned them before.              Please read over the following fact sheets you were given: IF YOU HAVE QUESTIONS ABOUT YOUR PRE-OP INSTRUCTIONS PLEASE CALL [phone]    [facility] - Preparing for Surgery Before surgery, you can play an important role.  Because skin is not sterile,  your skin needs to be as free of germs as possible.  You can reduce the number of germs on your skin by washing with CHG (chlorahexidine gluconate) soap before surgery.  CHG is an antiseptic cleaner which kills germs and bonds with the skin to continue killing germs even after washing. Please DO NOT use if you have an allergy to CHG or antibacterial soaps.  If your skin becomes reddened/irritated stop using the CHG and inform your nurse when you arrive at Short Stay. Do not shave (including legs and underarms) for at least 48 hours prior to the first CHG shower.  You may shave your face/neck. Please follow these instructions carefully:  1.  Shower with CHG Soap the night before surgery and the  morning of Surgery.  2.  If you choose to wash your hair, wash your hair first as usual with your  normal  shampoo.  3.  After you shampoo, rinse your hair and body thoroughly to remove the  shampoo.                           4.  Use CHG as you would any other liquid soap.  You can apply chg directly  to the skin and wash                       Gently with a scrungie or clean washcloth.  5.  Apply the CHG Soap to your body ONLY FROM THE NECK DOWN.   Do not use on face/ open                           Wound or open sores. Avoid contact with eyes, ears mouth and genitals (private parts).                       Wash face,  Genitals (private parts) with your normal soap.             6.  Wash thoroughly, paying special attention to the area where your surgery  will be performed.  7.  Thoroughly rinse your body with warm water from the neck down.  8.  DO NOT shower/wash with your normal soap after using and rinsing off  the CHG Soap.                9.  Pat yourself dry with a clean towel.            10.  Wear clean pajamas.            11.  Place clean sheets on your bed the night of your first shower and do not  sleep with pets. Day of Surgery : Do not apply any lotions/deodorants the morning of surgery.  Please wear  clean clothes to the hospital/surgery center.  FAILURE TO FOLLOW THESE INSTRUCTIONS MAY RESULT IN THE  CANCELLATION OF YOUR SURGERY PATIENT SIGNATURE_________________________________  NURSE SIGNATURE__________________________________  ________________________________________________________________________

## 2023-04-24 NOTE — Progress Notes (Signed)
The proposed treatment discussed in conference is for discussion purpose only and is not a binding recommendation.  The patients have not been physically examined, or presented with their treatment options.  Therefore, final treatment plans cannot be decided.  

## 2023-04-25 ENCOUNTER — Encounter (HOSPITAL_COMMUNITY): Payer: Self-pay

## 2023-04-25 ENCOUNTER — Other Ambulatory Visit: Payer: Self-pay

## 2023-04-25 ENCOUNTER — Encounter (HOSPITAL_COMMUNITY)
Admission: RE | Admit: 2023-04-25 | Discharge: 2023-04-25 | Disposition: A | Discharge status: Home / Self Care | Payer: Medicare Other | Source: Ambulatory Visit | Attending: Surgery | Admitting: Surgery

## 2023-04-25 VITALS — BP 110/45 | HR 74 | Temp 97.5°F | Ht 65.0 in | Wt 134.0 lb

## 2023-04-25 DIAGNOSIS — E1142 Type 2 diabetes mellitus with diabetic polyneuropathy: Secondary | ICD-10-CM | POA: Insufficient documentation

## 2023-04-25 DIAGNOSIS — I2510 Atherosclerotic heart disease of native coronary artery without angina pectoris: Secondary | ICD-10-CM | POA: Insufficient documentation

## 2023-04-25 DIAGNOSIS — Z87891 Personal history of nicotine dependence: Secondary | ICD-10-CM | POA: Diagnosis not present

## 2023-04-25 DIAGNOSIS — I252 Old myocardial infarction: Secondary | ICD-10-CM | POA: Diagnosis not present

## 2023-04-25 DIAGNOSIS — Z951 Presence of aortocoronary bypass graft: Secondary | ICD-10-CM | POA: Insufficient documentation

## 2023-04-25 DIAGNOSIS — C23 Malignant neoplasm of gallbladder: Secondary | ICD-10-CM | POA: Diagnosis not present

## 2023-04-25 DIAGNOSIS — Z01818 Encounter for other preprocedural examination: Secondary | ICD-10-CM | POA: Diagnosis present

## 2023-04-25 DIAGNOSIS — Z01812 Encounter for preprocedural laboratory examination: Secondary | ICD-10-CM | POA: Diagnosis not present

## 2023-04-25 HISTORY — DX: Unspecified jaundice: R17

## 2023-04-25 LAB — GLUCOSE, CAPILLARY: Glucose-Capillary: 224 mg/dL — ABNORMAL HIGH (ref 70–99)

## 2023-04-25 NOTE — Anesthesia Preprocedure Evaluation (Addendum)
Anesthesia Evaluation  Patient identified by MRN, date of birth, ID band Patient awake    Reviewed: Allergy & Precautions, H&P , NPO status , Patient's Chart, lab work & pertinent test results  Airway Mallampati: III  TM Distance: >3 FB Neck ROM: Full    Dental no notable dental hx. (+) Teeth Intact, Dental Advisory Given   Pulmonary former smoker   Pulmonary exam normal breath sounds clear to auscultation       Cardiovascular hypertension, Pt. on medications and Pt. on home beta blockers + CAD, + Past MI, + CABG and + Peripheral Vascular Disease  Normal cardiovascular exam Rhythm:Regular Rate:Normal     Neuro/Psych negative neurological ROS  negative psych ROS   GI/Hepatic Neg liver ROS,GERD  Medicated,,Gallbladder CA   Endo/Other  diabetes    Renal/GU Renal InsufficiencyRenal diseaseLab Results      Component                Value               Date                      K                        3.5                 04/09/2023            CREATININE               1.37 (H)            04/09/2023                       GLUCOSE                  116 (H)             04/09/2023             negative genitourinary   Musculoskeletal  (+) Arthritis , Osteoarthritis,    Abdominal   Peds  Hematology  (+) Blood dyscrasia, anemia Lab Results      Component                Value               Date                      HGB                      9.0 (L)             04/09/2023                HCT                      26.5 (L)            04/09/2023               PLT                      174                 04/09/2023              Anesthesia Other Findings All: see list  Reproductive/Obstetrics negative OB  ROS                             Anesthesia Physical Anesthesia Plan  ASA: 3  Anesthesia Plan: General   Post-op Pain Management: Minimal or no pain anticipated   Induction: Intravenous  PONV Risk  Score and Plan: 3 and Ondansetron, Dexamethasone, Treatment may vary due to age or medical condition, TIVA and Propofol infusion  Airway Management Planned: LMA  Additional Equipment: None  Intra-op Plan:   Post-operative Plan: Extubation in OR  Informed Consent: I have reviewed the patients History and Physical, chart, labs and discussed the procedure including the risks, benefits and alternatives for the proposed anesthesia with the patient or authorized representative who has indicated his/her understanding and acceptance.     Dental advisory given  Plan Discussed with: CRNA  Anesthesia Plan Comments: (See PAT note from 8/1 by K Gekas PA-C [facility] )       Anesthesia Quick Evaluation

## 2023-04-25 NOTE — Progress Notes (Signed)
For Short Stay: COVID SWAB appointment date:  Bowel Prep reminder:   For Anesthesia: PCP - Dr. Garth Bigness. Cardiologist - Dr. Nanetta Batty CT Chest: 04/23/23 Chest x-ray - 06/28/22 EKG - 09/13/22 Stress Test -  ECHO - 03/24/20 Cardiac Cath -  Pacemaker/ICD device last checked: Pacemaker orders received: Device Rep notified:  Spinal Cord Stimulator:  Sleep Study - N/A CPAP -   Fasting Blood Sugar - 100's Checks Blood Sugar ___1__ times a day Date and result of last Hgb A1c- 7.9: 03/11/23  Last dose of GLP1 agonist- N/A GLP1 instructions:   Last dose of SGLT-2 inhibitors- N/A SGLT-2 instructions:   Blood Thinner Instructions: No instructions yet.Pt. Was advised to call vascular MD. : Dr. Heath Lark to ask instructions about xarelto,pharmacy recommendations for xarelto were provided to the pt. Aspirin Instructions: Last Dose:  Activity level: Can go up a flight of stairs and activities of daily living without stopping and without chest pain and/or shortness of breath   Able to exercise without chest pain and/or shortness of breath  Anesthesia review: Hx: DIA,HTN,CAD,DVT's,PVC's,heart murmur,MI.  Patient denies shortness of breath, fever, cough and chest pain at PAT appointment   Patient verbalized understanding of instructions that were given to them at the PAT appointment. Patient was also instructed that they will need to review over the PAT instructions again at home before surgery.

## 2023-04-25 NOTE — Progress Notes (Signed)
Case: [id number] Date/Time: 04/29/23 1345   Procedure: INSERTION PORT-A-CATH   Anesthesia type: General   Pre-op diagnosis: CANCER GALLBLADDER   Location: [facility] / [facility]   Surgeons: Fritzi Mandes, MD       DISCUSSION: [name] is an 82 yo female who presents to PAT prior to surgery above. PMH significant for former smoking, hx of NSTEMI (2021), CAD s/p CABG (2021), extensive PAD hx, HTN, HLD, heart murmur (mild MR 03/24/20 TEE), PVCs, DM2, peripheral neuropathy, CKD, hiatal hernia, GERD, gallbladder cancer.  Prior anesthesia complications include becoming agitated, "jerking" with Versed once in the past.   Patient presented to the ED on 04/04/23 with painless jaundice. Ultimately diagnosed with gallbladder cancer and referred to oncology. They have recommended chemotherapy and patient is now scheduled to undergo port-a-cath placement.  Patient follows with PCP for chronic medical conditions which appear stable/controlled. Last seen on 04/17/23.   Patient follows with Cardiology. Last seen in clinic on 09/13/2022. Noted to be doing well from a cardiac standpoint at that visit. Denies CP/SOB at PAT visit. Formal cardiac clearance not requested due to urgency of case.  Patient is followed by vascular surgery for extensive PAD hx. She has hx of critical limb ischemia, nonhealing wounds s/p multiple interventions including bilateral TMA's, stenting of right extrailiac artery, and PTCA to right popliteal and peroneal arteries. Last seen in clinic on 12/25/22. She has remained clinically asymptomatic however per Dr. Lenell Antu her "noninvasive studies remain worrisome". She takes Xarelto and did not have hold instructions at time of PAT visit. She was instructed to call Dr. Verita Lamb office. Also discussed with triage RN Jeanice Lim at [facility] to please relay hold instructions to patient.  VS: BP (!) 110/45 Comment: manually  Pulse 74   Temp (!) 36.4 C (Oral)   Ht 5\' 5"  (1.651 m)   Wt 60.8 kg   SpO2 100%    BMI 22.30 kg/m   PROVIDERS: PCP - Dr. Garth Bigness. Cardiologist - Dr. Nanetta Batty Vascular - Heath Lark, MD  LABS: Labs reviewed: Acceptable for surgery. (all labs ordered are listed, but only abnormal results are displayed)  Labs Reviewed  GLUCOSE, CAPILLARY - Abnormal; Notable for the following components:      Result Value   Glucose-Capillary 224 (*)    All other components within normal limits     IMAGES:  CT Chest 04/22/23 : IMPRESSION: 1. No evidence of metastatic disease within the chest. 2. Small scattered lung nodules which have a nonspecific appearance. Most of these measure less than 5 mm and are favored to represent a benign postinflammatory/infectious process. A similar appearance was found on remote CT of the chest from. 02/13/2012 no new, highly suspicious lung nodules identified bilaterally. 3. Signs recent common bile duct stent placement with decompression of the dilated intrahepatic bile ducts. 4. Ill-defined low-density lesion within segment 5 is again noted. This area is only partially visualized. Refer to MRI report from 04/05/2023. 5. Aortic Atherosclerosis (ICD10-I70.0).  MRCP 04/05/23:  IMPRESSION: 1. No evidence of metastatic disease within the chest. 2. Small scattered lung nodules which have a nonspecific appearance. Most of these measure less than 5 mm and are favored to represent a benign postinflammatory/infectious process. A similar appearance was found on remote CT of the chest from. 02/13/2012 no new, highly suspicious lung nodules identified bilaterally. 3. Signs recent common bile duct stent placement with decompression of the dilated intrahepatic bile ducts. 4. Ill-defined low-density lesion within segment 5 is again noted. This  area is only partially visualized. Refer to MRI report from 04/05/2023. 5. Aortic Atherosclerosis (ICD10-I70.0).   EKG:   CV:  Carotid US 11/30/22:  Summary:  Right Carotid:  Velocities in the right ICA are consistent with a 40-59%                stenosis, based on PSV and plaque. The ECA appears >50% stenosed.   Left Carotid: Velocities in the left ICA are consistent with a 1-39%  stenosis. The ECA appears >50% stenosed.   Vertebrals:  Bilateral vertebral arteries demonstrate antegrade flow.  Subclavians: Normal flow hemodynamics were seen in bilateral subclavian arteries.    Echo 03/23/20:  IMPRESSIONS     1. Left ventricular ejection fraction, by estimation, is 60 to 65%. The  left ventricle has normal function. The left ventricle has no regional  wall motion abnormalities. Left ventricular diastolic parameters are  consistent with Grade I diastolic  dysfunction (impaired relaxation).   2. Right ventricular systolic function is normal. The right ventricular  size is normal. There is normal pulmonary artery systolic pressure.   3. Left atrial size was mildly dilated.   4. The mitral valve is normal in structure. Trivial mitral valve  regurgitation. No evidence of mitral stenosis.   5. The aortic valve is normal in structure. Aortic valve regurgitation is  not visualized. No aortic stenosis is present.   6. 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 There  is a small calcified mass in the L:A near the LA appendage. No valvular masses seen with associated mitral annular calcification. LA Volume/ BSA27.4 ml/m2 No AS. Right ventricular systolic pressure is elevated at .   Jaundice    Myocardial infarction [facility])    June 2021   Neuromuscular disorder (HCC)    neuropathy in bilateral feet   Peripheral vascular disease (HCC)    Pneumonia    not hosp.    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  PTCAs with drug coated balloon Lt ant tibial artery and Lt popliteal artery  11/26/2019    Past Surgical History:  Procedure Laterality Date   ABDOMINAL AORTAGRAM  11/25/2019   ABDOMINAL AORTOGRAM W/LOWER EXTREMITY    ABDOMINAL AORTOGRAM N/A 06/15/2019   Procedure: ABDOMINAL AORTOGRAM;  Surgeon: Runell Gess, MD;  [facility]: [facility];  Service: Cardiovascular;  Laterality: N/A;   ABDOMINAL AORTOGRAM W/LOWER EXTREMITY N/A 11/25/2019   Procedure: ABDOMINAL AORTOGRAM W/LOWER EXTREMITY;  Surgeon: Iran Ouch, MD;  [facility]: [facility];  Service: Cardiovascular;  Laterality: N/A;  Lt leg   ABDOMINAL AORTOGRAM W/LOWER EXTREMITY N/A 08/01/2020   Procedure: ABDOMINAL AORTOGRAM W/LOWER EXTREMITY;  Surgeon: Nanetta Batty  J, MD;  [facility];  Service: Cardiovascular;  Laterality: N/A;   ABDOMINAL AORTOGRAM W/LOWER EXTREMITY N/A 04/28/2021   Procedure: ABDOMINAL AORTOGRAM W/LOWER EXTREMITY;  Surgeon: Sherren Kerns, MD;  [facility];  Service: Cardiovascular;  Laterality: N/A;   ABDOMINAL AORTOGRAM W/LOWER EXTREMITY N/A 10/13/2021   Procedure: ABDOMINAL AORTOGRAM W/LOWER EXTREMITY;  Surgeon: Leonie Douglas, MD;  [facility];  Service: Cardiovascular;  Laterality: N/A;   ABDOMINAL AORTOGRAM W/LOWER EXTREMITY N/A 11/09/2022   Procedure: ABDOMINAL AORTOGRAM W/LOWER EXTREMITY;  Surgeon: Leonie Douglas, MD;  [facility];  Service: Cardiovascular;   Laterality: N/A;   ABDOMINAL AORTOGRAM W/LOWER EXTREMITY N/A 11/16/2022   Procedure: ABDOMINAL AORTOGRAM W/LOWER EXTREMITY;  Surgeon: Leonie Douglas, MD;  [facility];  Service: Cardiovascular;  Laterality: N/A;   AMPUTATION TOE Left 05/03/2021   Procedure: AMPUTATION OF THIRD LEFT  TOE;  Surgeon: Leonie Douglas, MD;  Location: [facility] OR;  Service: Vascular;  Laterality: Left;   APPENDECTOMY     APPLICATION OF WOUND VAC Right 07/21/2019   Procedure: Application Of Prevena Wound Vac Right Groin;  Surgeon: Nada Libman, MD;  Location: [facility] OR;  Service: Vascular;  Laterality: Right;   BILIARY BRUSHING  04/07/2023   Procedure: BILIARY BRUSHING;  Surgeon: Vida Rigger, MD;  Location: [facility] ENDOSCOPY;  Service: Gastroenterology;;   BILIARY STENT PLACEMENT  04/07/2023   Procedure: BILIARY STENT PLACEMENT;  Surgeon: Vida Rigger, MD;  Location: [facility] ENDOSCOPY;  Service: Gastroenterology;;   CARPAL TUNNEL RELEASE Left    CARPAL TUNNEL RELEASE Right    CESAREAN SECTION     x 2   CHOLECYSTECTOMY N/A 10/12/2022   Procedure: LAPAROSCOPIC PARTIAL FENESTRATING CHOLECYSTECTOMY;  Surgeon: Fritzi Mandes, MD;  [facility];  Service: General;  Laterality: N/A;   COLONOSCOPY     CORONARY ARTERY BYPASS GRAFT N/A 03/24/2020   Procedure: CORONARY ARTERY BYPASS GRAFTING (CABG) using LIMA to LAD (m); RIMA to RAMUS; Endoscopic Right Greater Saphenous Vein: SVG to Diag1; SVG to PLB (right); and SVG to PL (left).;  Surgeon: Linden Dolin, MD;  Location[facility] OR;  Service: Open Heart Surgery;  Laterality: N/A;  BILATERAL IMA   ENDARTERECTOMY FEMORAL Right 07/21/2019   Procedure: RIGHT ENDARTERECTOMY FEMORAL WITH PATCH ANGIOPLASTY;  Surgeon: Nada Libman, MD;  [facility];  Service: Vascular;  Laterality: Right;   ENDARTERECTOMY FEMORAL Right 08/16/2020   Procedure: RIGHT FEMORAL ENDARTERECTOMY;  Surgeon: Maeola Harman, MD;  Location: [facility] OR;  Service: Vascular;  Laterality: Right;    ENDOVEIN HARVEST OF GREATER SAPHENOUS VEIN Right 03/24/2020   Procedure: ENDOVEIN HARVEST OF GREATER SAPHENOUS VEIN;  Surgeon: Linden Dolin, MD;  [facility];  Service: Open Heart Surgery;  Laterality: Right;   ERCP N/A 04/07/2023   Procedure: ENDOSCOPIC RETROGRADE CHOLANGIOPANCREATOGRAPHY (ERCP);  Surgeon: Vida Rigger, MD;  Location: [facility] ENDOSCOPY;  Service: Gastroenterology;  Laterality: N/A;   EYE SURGERY     cataract removal bilaterally   FEMORAL-POPLITEAL BYPASS GRAFT Right 08/16/2020   Procedure: BYPASS GRAFT FEMORAL-POPLITEAL ARTERY;  Surgeon: Maeola Harman, MD;  Location: [facility] OR;  Service: Vascular;  Laterality: Right;   FEMORAL-POPLITEAL BYPASS GRAFT Left 05/03/2021   Procedure: LEFT FEMORAL TO BELOW KNEE POPLITEAL ARTERY BYPASS GRAFTING WITH 6MMX80 PTFE GRAFT;  Surgeon: Leonie Douglas, MD;  [facility];  Service: Vascular;  Laterality: Left;   LEFT HEART CATH AND CORONARY ANGIOGRAPHY N/A 03/22/2020  Procedure: LEFT HEART CATH AND CORONARY ANGIOGRAPHY;  Surgeon: Swaziland, Peter M, MD;  Location: [facility] INVASIVE CV LAB;  Service: Cardiovascular;  Laterality: N/A;   LOWER EXTREMITY ANGIOGRAPHY Bilateral 06/15/2019   Procedure: Lower Extremity Angiography;  Surgeon: Runell Gess, MD;  Location: [facility] INVASIVE CV LAB;  Service: Cardiovascular;  Laterality: Bilateral;   LOWER EXTREMITY ANGIOGRAPHY Right 08/03/2019   Procedure: LOWER EXTREMITY ANGIOGRAPHY;  Surgeon: Runell Gess, MD;  [facility];  Service: Cardiovascular;  Laterality: Right;   LOWER EXTREMITY ANGIOGRAPHY N/A 09/07/2021   Procedure: LOWER EXTREMITY ANGIOGRAPHY;  Surgeon: Cephus Shelling, MD;  [facility];  Service: Cardiovascular;  Laterality: N/A;   PATCH ANGIOPLASTY Right 07/21/2019   Procedure: Patch Angioplasty Right Femoral Artery;  Surgeon: Nada Libman, MD;  Location: [facility] OR;  Service: Vascular;  Laterality: Right;   PERIPHERAL INTRAVASCULAR LITHOTRIPSY   11/25/2019   Procedure: INTRAVASCULAR LITHOTRIPSY;  Surgeon: Iran Ouch, MD;  [facility];  Service: Cardiovascular;;  LT. SFA   PERIPHERAL VASCULAR ATHERECTOMY  08/03/2019   Procedure: PERIPHERAL VASCULAR ATHERECTOMY;  Surgeon: Runell Gess, MD;  Location: [facility] INVASIVE CV LAB;  Service: Cardiovascular;;  right SFA, right TP trunk   PERIPHERAL VASCULAR ATHERECTOMY  11/25/2019   Procedure: PERIPHERAL VASCULAR ATHERECTOMY;  Surgeon: Iran Ouch, MD;  [facility];  Service: Cardiovascular;;  Lt.  POPLITEAL and AT   PERIPHERAL VASCULAR BALLOON ANGIOPLASTY Left 06/15/2019   Procedure: PERIPHERAL VASCULAR BALLOON ANGIOPLASTY;  Surgeon: Runell Gess, MD;  [facility];  Service: Cardiovascular;  Laterality: Left;  SFA UNSUCCESSFUL UNABLE TO CROSS LESION   PERIPHERAL VASCULAR BALLOON ANGIOPLASTY  08/03/2019   Procedure: PERIPHERAL VASCULAR BALLOON ANGIOPLASTY;  Surgeon: Runell Gess, MD;  [facility];  Service: Cardiovascular;;  right SFA, Right TP trunk   PERIPHERAL VASCULAR BALLOON ANGIOPLASTY  09/07/2021   Procedure: PERIPHERAL VASCULAR BALLOON ANGIOPLASTY;  Surgeon: Cephus Shelling, MD;  [facility];  Service: Cardiovascular;;   PERIPHERAL VASCULAR BALLOON ANGIOPLASTY Right 10/13/2021   Procedure: PERIPHERAL VASCULAR BALLOON ANGIOPLASTY;  Surgeon: Leonie Douglas, MD;  [facility];  Service: Cardiovascular;  Laterality: Right;   PERIPHERAL VASCULAR BALLOON ANGIOPLASTY  11/09/2022   Procedure: PERIPHERAL VASCULAR BALLOON ANGIOPLASTY;  Surgeon: Leonie Douglas, MD;  [facility];  Service: Cardiovascular;;  fem-pop bypass and AT   REMOVAL OF STONES  04/07/2023   Procedure: REMOVAL OF STONES;  Surgeon: Vida Rigger, MD;  Location: [facility] ENDOSCOPY;  Service: Gastroenterology;;   Dennison Mascot  04/07/2023   Procedure: Dennison Mascot;  Surgeon: Vida Rigger, MD;   Location: [facility] ENDOSCOPY;  Service: Gastroenterology;;   TEE WITHOUT CARDIOVERSION N/A 03/24/2020   Procedure: TRANSESOPHAGEAL ECHOCARDIOGRAM (TEE);  Surgeon: Linden Dolin, MD;  Location: [facility] OR;  Service: Open Heart Surgery;  Laterality: N/A;   TONSILLECTOMY     and adenoidectomy   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/2019   Procedure: TRANSMETATARSAL AMPUTATION;  Surgeon: Vivi Barrack, DPM;  [facility];  Service: Podiatry;  Laterality: Right;   TRANSMETATARSAL AMPUTATION Left 05/11/2021   Procedure: LEFT TRANSMETATARSAL AMPUTATION;  Surgeon: Maeola Harman, MD;  Location: [facility] OR;  Service: Vascular;  Laterality: Left;   TUBAL LIGATION      MEDICATIONS:  Ascorbic Acid (VITA-C PO)   aspirin EC 81 MG tablet   atorvastatin (LIPITOR) 80 MG tablet   brimonidine (ALPHAGAN) 0.2 % ophthalmic solution  Cholecalciferol (VITAMIN D3 GUMMIES PO)   CINNAMON PO   Cyanocobalamin (B-12 PO)   hydrochlorothiazide (HYDRODIURIL) 12.5 MG tablet   Insulin Lispro Prot & Lispro (HUMALOG MIX 75/25 KWIKPEN) (75-25) 100 UNIT/ML Kwikpen   Insulin Pen Needle (ULTRA-THIN II MINI PEN NEEDLE) 31G X 5 MM MISC   JARDIANCE 25 MG TABS tablet   Lancets (ONETOUCH DELICA PLUS LANCET33G) MISC   lidocaine-prilocaine (EMLA) cream   Menatetrenone (VITAMIN K2) 100 MCG TABS   metFORMIN (GLUCOPHAGE) 1000 MG tablet   metoprolol tartrate (LOPRESSOR) 50 MG tablet   mupirocin ointment (BACTROBAN) 2 %   ondansetron (ZOFRAN) 8 MG tablet   ONETOUCH VERIO test strip   pantoprazole (PROTONIX) 40 MG tablet   prochlorperazine (COMPAZINE) 10 MG tablet   Propylene Glycol (SYSTANE BALANCE) 0.6 % SOLN   repaglinide (PRANDIN) 2 MG tablet   telmisartan (MICARDIS) 80 MG tablet   XARELTO 2.5 MG TABS tablet    sodium chloride flush (NS) 0.9 % injection 3 mL   Ubaldo Glassing, PA-C [facility] Surgical Short Stay/Anesthesiology [facility] Phone [phone] 04/25/2023 3:01 PM

## 2023-04-26 NOTE — Progress Notes (Signed)
Pharmacist Chemotherapy Monitoring - Initial Assessment    Anticipated start date: 05/03/23   The following has been reviewed per standard work regarding the patient's treatment regimen: The patient's diagnosis, treatment plan and drug doses, and organ/hematologic function Lab orders and baseline tests specific to treatment regimen  The treatment plan start date, drug sequencing, and pre-medications Prior authorization status  Patient's documented medication list, including drug-drug interaction screen and prescriptions for anti-emetics and supportive care specific to the treatment regimen The drug concentrations, fluid compatibility, administration routes, and timing of the medications to be used The patient's access for treatment and lifetime cumulative dose history, if applicable  The patient's medication allergies and previous infusion related reactions, if applicable   Changes made to treatment plan:  N/A  Follow up needed:  N/A   Ebony Hail, Pharm.D., CPP 04/26/2023@3 :54 PM

## 2023-04-29 ENCOUNTER — Encounter (HOSPITAL_COMMUNITY)
Admission: RE | Disposition: A | Discharge status: Home / Self Care | Payer: Self-pay | Source: Home / Self Care | Attending: Surgery

## 2023-04-29 ENCOUNTER — Ambulatory Visit (HOSPITAL_COMMUNITY)
Admission: RE | Admit: 2023-04-29 | Discharge: 2023-04-29 | Disposition: A | Discharge status: Home / Self Care | Payer: Medicare Other | Attending: Surgery | Admitting: Surgery

## 2023-04-29 ENCOUNTER — Ambulatory Visit (HOSPITAL_BASED_OUTPATIENT_CLINIC_OR_DEPARTMENT_OTHER): Payer: Medicare Other | Admitting: Anesthesiology

## 2023-04-29 ENCOUNTER — Inpatient Hospital Stay: Payer: Medicare Other | Attending: Hematology | Admitting: Licensed Clinical Social Worker

## 2023-04-29 ENCOUNTER — Encounter (HOSPITAL_COMMUNITY): Payer: Self-pay | Admitting: Surgery

## 2023-04-29 ENCOUNTER — Ambulatory Visit (HOSPITAL_COMMUNITY): Payer: Medicare Other | Admitting: Physician Assistant

## 2023-04-29 ENCOUNTER — Ambulatory Visit (HOSPITAL_COMMUNITY): Payer: Medicare Other

## 2023-04-29 DIAGNOSIS — Z7901 Long term (current) use of anticoagulants: Secondary | ICD-10-CM | POA: Insufficient documentation

## 2023-04-29 DIAGNOSIS — I10 Essential (primary) hypertension: Secondary | ICD-10-CM | POA: Diagnosis not present

## 2023-04-29 DIAGNOSIS — Z87891 Personal history of nicotine dependence: Secondary | ICD-10-CM | POA: Diagnosis not present

## 2023-04-29 DIAGNOSIS — E1151 Type 2 diabetes mellitus with diabetic peripheral angiopathy without gangrene: Secondary | ICD-10-CM | POA: Diagnosis not present

## 2023-04-29 DIAGNOSIS — Z452 Encounter for adjustment and management of vascular access device: Secondary | ICD-10-CM | POA: Insufficient documentation

## 2023-04-29 DIAGNOSIS — E785 Hyperlipidemia, unspecified: Secondary | ICD-10-CM | POA: Diagnosis not present

## 2023-04-29 DIAGNOSIS — K219 Gastro-esophageal reflux disease without esophagitis: Secondary | ICD-10-CM | POA: Insufficient documentation

## 2023-04-29 DIAGNOSIS — Z955 Presence of coronary angioplasty implant and graft: Secondary | ICD-10-CM | POA: Insufficient documentation

## 2023-04-29 DIAGNOSIS — Z7984 Long term (current) use of oral hypoglycemic drugs: Secondary | ICD-10-CM | POA: Diagnosis not present

## 2023-04-29 DIAGNOSIS — C787 Secondary malignant neoplasm of liver and intrahepatic bile duct: Secondary | ICD-10-CM | POA: Diagnosis not present

## 2023-04-29 DIAGNOSIS — E119 Type 2 diabetes mellitus without complications: Secondary | ICD-10-CM | POA: Insufficient documentation

## 2023-04-29 DIAGNOSIS — Z9049 Acquired absence of other specified parts of digestive tract: Secondary | ICD-10-CM | POA: Insufficient documentation

## 2023-04-29 DIAGNOSIS — C23 Malignant neoplasm of gallbladder: Secondary | ICD-10-CM

## 2023-04-29 DIAGNOSIS — I2510 Atherosclerotic heart disease of native coronary artery without angina pectoris: Secondary | ICD-10-CM | POA: Diagnosis not present

## 2023-04-29 DIAGNOSIS — Z794 Long term (current) use of insulin: Secondary | ICD-10-CM | POA: Insufficient documentation

## 2023-04-29 DIAGNOSIS — C221 Intrahepatic bile duct carcinoma: Secondary | ICD-10-CM | POA: Insufficient documentation

## 2023-04-29 DIAGNOSIS — Z951 Presence of aortocoronary bypass graft: Secondary | ICD-10-CM | POA: Insufficient documentation

## 2023-04-29 DIAGNOSIS — Z5111 Encounter for antineoplastic chemotherapy: Secondary | ICD-10-CM | POA: Insufficient documentation

## 2023-04-29 DIAGNOSIS — Z5112 Encounter for antineoplastic immunotherapy: Secondary | ICD-10-CM | POA: Insufficient documentation

## 2023-04-29 HISTORY — PX: PORTACATH PLACEMENT: SHX2246

## 2023-04-29 LAB — GLUCOSE, CAPILLARY
Glucose-Capillary: 174 mg/dL — ABNORMAL HIGH (ref 70–99)
Glucose-Capillary: 124 mg/dL — ABNORMAL HIGH (ref 70–99)

## 2023-04-29 SURGERY — INSERTION, TUNNELED CENTRAL VENOUS DEVICE, WITH PORT
Anesthesia: General | Site: Chest | Laterality: Left

## 2023-04-29 MED ORDER — CEFAZOLIN SODIUM-DEXTROSE 2-4 GM/100ML-% IV SOLN
2.0000 g | INTRAVENOUS | Status: AC
  Administered 2023-04-29: 2 g via INTRAVENOUS
  Filled 2023-04-29: qty 100

## 2023-04-29 MED ORDER — DEXAMETHASONE SODIUM PHOSPHATE 4 MG/ML IJ SOLN: INTRAMUSCULAR | Status: DC | PRN

## 2023-04-29 MED ORDER — DEXMEDETOMIDINE HCL IN NACL 80 MCG/20ML IV SOLN
INTRAVENOUS | Status: AC
  Filled 2023-04-29: qty 20

## 2023-04-29 MED ORDER — FENTANYL CITRATE (PF) 100 MCG/2ML IJ SOLN
INTRAMUSCULAR | Status: DC | PRN
  Administered 2023-04-29: 25 ug via INTRAVENOUS

## 2023-04-29 MED ORDER — FENTANYL CITRATE PF 50 MCG/ML IJ SOSY: 25.0000 ug | PREFILLED_SYRINGE | INTRAMUSCULAR | Status: DC | PRN

## 2023-04-29 MED ORDER — LIDOCAINE HCL (PF) 2 % IJ SOLN
INTRAMUSCULAR | Status: AC
  Filled 2023-04-29: qty 5

## 2023-04-29 MED ORDER — ONDANSETRON HCL 4 MG/2ML IJ SOLN
INTRAMUSCULAR | Status: DC | PRN
  Administered 2023-04-29: 4 mg via INTRAVENOUS

## 2023-04-29 MED ORDER — PROPOFOL 1000 MG/100ML IV EMUL
INTRAVENOUS | Status: AC
  Filled 2023-04-29: qty 100

## 2023-04-29 MED ORDER — ONDANSETRON HCL 4 MG/2ML IJ SOLN: 4.0000 mg | Freq: Once | INTRAMUSCULAR | Status: DC | PRN

## 2023-04-29 MED ORDER — ACETAMINOPHEN 10 MG/ML IV SOLN: 1000.0000 mg | Freq: Once | INTRAVENOUS | Status: DC | PRN

## 2023-04-29 MED ORDER — BUPIVACAINE-EPINEPHRINE 0.25% -1:200000 IJ SOLN
INTRAMUSCULAR | Status: DC | PRN
  Administered 2023-04-29: 10 mL

## 2023-04-29 MED ORDER — PROPOFOL 500 MG/50ML IV EMUL
INTRAVENOUS | Status: DC | PRN
  Administered 2023-04-29: 90 ug/kg/min via INTRAVENOUS

## 2023-04-29 MED ORDER — LACTATED RINGERS IV SOLN: INTRAVENOUS | Status: DC

## 2023-04-29 MED ORDER — HEPARIN 6000 UNIT IRRIGATION SOLUTION
Freq: Once | Status: AC
  Administered 2023-04-29: 1
  Filled 2023-04-29: qty 6000

## 2023-04-29 MED ORDER — CHLORHEXIDINE GLUCONATE 0.12 % MT SOLN
15.0000 mL | Freq: Once | OROMUCOSAL | Status: AC
  Administered 2023-04-29: 15 mL via OROMUCOSAL

## 2023-04-29 MED ORDER — HEPARIN SOD (PORK) LOCK FLUSH 100 UNIT/ML IV SOLN
INTRAVENOUS | Status: DC | PRN
  Administered 2023-04-29: 500 [IU] via INTRAVENOUS

## 2023-04-29 MED ORDER — PROPOFOL 10 MG/ML IV BOLUS
INTRAVENOUS | Status: DC | PRN
Start: 2023-04-29 — End: 2023-04-29
  Administered 2023-04-29: 100 mg via INTRAVENOUS
  Administered 2023-04-29: 40 mg via INTRAVENOUS

## 2023-04-29 MED ORDER — INSULIN ASPART 100 UNIT/ML IJ SOLN: 0.0000 [IU] | INTRAMUSCULAR | Status: DC | PRN

## 2023-04-29 MED ORDER — ORAL CARE MOUTH RINSE: 15.0000 mL | Freq: Once | OROMUCOSAL | Status: AC

## 2023-04-29 MED ORDER — LIDOCAINE 2% (20 MG/ML) 5 ML SYRINGE
INTRAMUSCULAR | Status: DC | PRN
  Administered 2023-04-29: 60 mg via INTRAVENOUS

## 2023-04-29 MED ORDER — 0.9 % SODIUM CHLORIDE (POUR BTL) OPTIME
TOPICAL | Status: DC | PRN
  Administered 2023-04-29: 1000 mL

## 2023-04-29 MED ORDER — PROPOFOL 10 MG/ML IV BOLUS
INTRAVENOUS | Status: AC
  Filled 2023-04-29: qty 20

## 2023-04-29 MED ORDER — FENTANYL CITRATE (PF) 100 MCG/2ML IJ SOLN
INTRAMUSCULAR | Status: AC
  Filled 2023-04-29: qty 2

## 2023-04-29 MED ORDER — HEPARIN SOD (PORK) LOCK FLUSH 100 UNIT/ML IV SOLN
INTRAVENOUS | Status: AC
  Filled 2023-04-29: qty 5

## 2023-04-29 MED ORDER — DEXMEDETOMIDINE HCL IN NACL 200 MCG/50ML IV SOLN
INTRAVENOUS | Status: DC | PRN
  Administered 2023-04-29 (×2): 4 ug via INTRAVENOUS

## 2023-04-29 MED ORDER — BUPIVACAINE-EPINEPHRINE (PF) 0.5% -1:200000 IJ SOLN
INTRAMUSCULAR | Status: AC
  Filled 2023-04-29: qty 30

## 2023-04-29 MED ORDER — ONDANSETRON HCL 4 MG/2ML IJ SOLN
INTRAMUSCULAR | Status: AC
  Filled 2023-04-29: qty 2

## 2023-04-29 SURGICAL SUPPLY — 41 items
ADH SKN CLS APL DERMABOND .7 (GAUZE/BANDAGES/DRESSINGS) ×1
APL PRP STRL LF DISP 70% ISPRP (MISCELLANEOUS) ×1
APL SKNCLS STERI-STRIP NONHPOA (GAUZE/BANDAGES/DRESSINGS)
BAG DECANTER FOR FLEXI CONT (MISCELLANEOUS) ×1 IMPLANT
BENZOIN TINCTURE PRP APPL 2/3 (GAUZE/BANDAGES/DRESSINGS) IMPLANT
BLADE SURG 15 STRL LF DISP TIS (BLADE) ×1 IMPLANT
BLADE SURG 15 STRL SS (BLADE) ×1
BLADE SURG SZ11 CARB STEEL (BLADE) ×1 IMPLANT
CHLORAPREP W/TINT 26 (MISCELLANEOUS) ×1 IMPLANT
CLSR STERI-STRIP ANTIMIC 1/2X4 (GAUZE/BANDAGES/DRESSINGS) IMPLANT
COVER SURGICAL LIGHT HANDLE (MISCELLANEOUS) ×1 IMPLANT
DERMABOND ADVANCED .7 DNX12 (GAUZE/BANDAGES/DRESSINGS) IMPLANT
DRAPE C-ARM 42X120 X-RAY (DRAPES) ×1 IMPLANT
DRAPE LAPAROSCOPIC ABDOMINAL (DRAPES) ×1 IMPLANT
DRAPE UTILITY XL STRL (DRAPES) ×1 IMPLANT
ELECT REM PT RETURN 15FT ADLT (MISCELLANEOUS) ×1 IMPLANT
GAUZE 4X4 16PLY ~~LOC~~+RFID DBL (SPONGE) ×1 IMPLANT
GAUZE SPONGE 4X4 12PLY STRL (GAUZE/BANDAGES/DRESSINGS) ×1 IMPLANT
GLOVE BIOGEL PI IND STRL 6 (GLOVE) ×1 IMPLANT
GLOVE BIOGEL PI MICRO STRL 5.5 (GLOVE) ×1 IMPLANT
GOWN STRL REUS W/ TWL LRG LVL3 (GOWN DISPOSABLE) ×1 IMPLANT
GOWN STRL REUS W/TWL LRG LVL3 (GOWN DISPOSABLE) ×1
KIT BASIN OR (CUSTOM PROCEDURE TRAY) ×1 IMPLANT
KIT PORT POWER 8FR ISP CVUE (Port) ×1 IMPLANT
KIT TURNOVER KIT A (KITS) IMPLANT
NDL HYPO 22X1.5 SAFETY MO (MISCELLANEOUS) ×1 IMPLANT
NEEDLE HYPO 22X1.5 SAFETY MO (MISCELLANEOUS) ×1 IMPLANT
PACK BASIC VI WITH GOWN DISP (CUSTOM PROCEDURE TRAY) ×1 IMPLANT
PENCIL SMOKE EVACUATOR (MISCELLANEOUS) ×1 IMPLANT
SPIKE FLUID TRANSFER (MISCELLANEOUS) ×1 IMPLANT
SUT MNCRL AB 4-0 PS2 18 (SUTURE) ×1 IMPLANT
SUT PROLENE 2 0 SH DA (SUTURE) ×2 IMPLANT
SUT VIC AB 2-0 SH 18 (SUTURE) IMPLANT
SUT VIC AB 2-0 SH 27 (SUTURE)
SUT VIC AB 2-0 SH 27X BRD (SUTURE) IMPLANT
SUT VIC AB 3-0 SH 27 (SUTURE) ×1
SUT VIC AB 3-0 SH 27X BRD (SUTURE) ×1 IMPLANT
SYR 20ML ECCENTRIC (SYRINGE) ×1 IMPLANT
SYR 20ML LL LF (SYRINGE) ×1 IMPLANT
TOWEL OR 17X26 10 PK STRL BLUE (TOWEL DISPOSABLE) ×1 IMPLANT
TOWEL OR NON WOVEN STRL DISP B (DISPOSABLE) ×1 IMPLANT

## 2023-04-29 NOTE — Discharge Instructions (Addendum)
SURGERY DISCHARGE INSTRUCTIONS: PORT-A-CATH PLACEMENT  Activity You may resume your usual activities as tolerated Ok to shower in 48 hours, but do not bathe or submerge incision underwater for 2 weeks. Do not drive while taking narcotic pain medication.  Wound Care Your incision is covered with skin glue called Dermabond. This will peel off on its own over time. You may shower and allow warm soapy water to run over your incision in 48 hours. Gently pat dry. Do not submerge your incision underwater for 2 weeks. Monitor your incision for any new redness, tenderness, or drainage. You may start using your port in 48 hours. Do not apply EMLA cream directly over the Dermabond (skin glue).  When to Call us: Fever greater than 100.5 New redness, drainage, or swelling at incision site Severe pain, nausea, or vomiting Shortness of breath, difficulty breathing  You may resume taking your Xarelto on Tuesday evening, August 6.  For questions or concerns, please call the [facility] Surgery office at [phone].

## 2023-04-29 NOTE — H&P (Signed)
[name] is an 82 y.o. female.   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 She is to begin chemotherapy later this week and presents today for port placement. She stopped taking her Xarelto 3 days ago.  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 There is a small calcified mass in the L:A near the LA appendage. No valvular masses seen with associated mitral annular calcification. LA Volume/ BSA27.4 ml/m2 No AS. Right ventricular systolic pressure is elevated at .   Jaundice    Myocardial infarction [facility])    June 2021   Neuromuscular disorder (HCC)    neuropathy in bilateral feet   Peripheral vascular disease (HCC)    Pneumonia    not hosp.    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  PTCAs with drug coated balloon Lt ant tibial artery and Lt popliteal artery  11/26/2019    Past Surgical History:  Procedure Laterality Date   ABDOMINAL AORTAGRAM  11/25/2019   ABDOMINAL AORTOGRAM W/LOWER EXTREMITY    ABDOMINAL AORTOGRAM N/A 06/15/2019   Procedure: ABDOMINAL AORTOGRAM;  Surgeon: Runell Gess, MD;  [facility];  Service: Cardiovascular;  Laterality: N/A;   ABDOMINAL AORTOGRAM W/LOWER EXTREMITY N/A 11/25/2019   Procedure: ABDOMINAL AORTOGRAM W/LOWER EXTREMITY;  Surgeon: Iran Ouch, MD;  [facility];   Service: Cardiovascular;  Laterality: N/A;  Lt leg   ABDOMINAL AORTOGRAM W/LOWER EXTREMITY N/A 08/01/2020   Procedure: ABDOMINAL AORTOGRAM W/LOWER EXTREMITY;  Surgeon: Runell Gess, MD;  [facility];  Service: Cardiovascular;  Laterality: N/A;   ABDOMINAL AORTOGRAM W/LOWER EXTREMITY N/A 04/28/2021   Procedure: ABDOMINAL AORTOGRAM W/LOWER EXTREMITY;  Surgeon: Sherren Kerns, MD;  [facility];  Service: Cardiovascular;  Laterality: N/A;   ABDOMINAL AORTOGRAM W/LOWER EXTREMITY N/A 10/13/2021   Procedure: ABDOMINAL AORTOGRAM W/LOWER EXTREMITY;  Surgeon: Leonie Douglas, MD;  [facility];  Service: Cardiovascular;  Laterality: N/A;   ABDOMINAL AORTOGRAM W/LOWER EXTREMITY N/A 11/09/2022   Procedure: ABDOMINAL AORTOGRAM W/LOWER EXTREMITY;  Surgeon: Leonie Douglas, MD;  [facility];  Service: Cardiovascular;  Laterality: N/A;   ABDOMINAL AORTOGRAM W/LOWER EXTREMITY N/A 11/16/2022   Procedure: ABDOMINAL AORTOGRAM W/LOWER EXTREMITY;  Surgeon: Leonie Douglas, MD;  [facility];  Service: Cardiovascular;  Laterality: N/A;   AMPUTATION TOE Left 05/03/2021   Procedure: AMPUTATION OF THIRD LEFT  TOE;  Surgeon: Leonie Douglas, MD;  Location: [facility] OR;  Service: Vascular;  Laterality: Left;   APPENDECTOMY     APPLICATION OF WOUND VAC Right 07/21/2019   Procedure: Application Of Prevena Wound Vac Right Groin;  Surgeon: Nada Libman, MD;  Location: [facility] OR;  Service: Vascular;  Laterality:  Right;   BILIARY BRUSHING  04/07/2023   Procedure: BILIARY BRUSHING;  Surgeon: Vida Rigger, MD;  Location: [facility] ENDOSCOPY;  Service: Gastroenterology;;   BILIARY STENT PLACEMENT  04/07/2023   Procedure: BILIARY STENT PLACEMENT;  Surgeon: Vida Rigger, MD;  Location: [facility] ENDOSCOPY;  Service: Gastroenterology;;   CARPAL TUNNEL RELEASE Left    CARPAL TUNNEL RELEASE Right    CESAREAN SECTION     x 2   CHOLECYSTECTOMY N/A 10/12/2022    Procedure: LAPAROSCOPIC PARTIAL FENESTRATING CHOLECYSTECTOMY;  Surgeon: Fritzi Mandes, MD;  [facility];  Service: General;  Laterality: N/A;   COLONOSCOPY     CORONARY ARTERY BYPASS GRAFT N/A 03/24/2020   Procedure: CORONARY ARTERY BYPASS GRAFTING (CABG) using LIMA to LAD (m); RIMA to RAMUS; Endoscopic Right Greater Saphenous Vein: SVG to Diag1; SVG to PLB (right); and SVG to PL (left).;  Surgeon: Linden Dolin, MD;  [facility];  Service: Open Heart Surgery;  Laterality: N/A;  BILATERAL IMA   ENDARTERECTOMY FEMORAL Right 07/21/2019   Procedure: RIGHT ENDARTERECTOMY FEMORAL WITH PATCH ANGIOPLASTY;  Surgeon: Nada Libman, MD;  [facility];  Service: Vascular;  Laterality: Right;   ENDARTERECTOMY FEMORAL Right 08/16/2020   Procedure: RIGHT FEMORAL ENDARTERECTOMY;  Surgeon: Maeola Harman, MD;  Location: [facility] OR;  Service: Vascular;  Laterality: Right;   ENDOVEIN HARVEST OF GREATER SAPHENOUS VEIN Right 03/24/2020   Procedure: ENDOVEIN HARVEST OF GREATER SAPHENOUS VEIN;  Surgeon: Linden Dolin, MD;  [facility];  Service: Open Heart Surgery;  Laterality: Right;   ERCP N/A 04/07/2023   Procedure: ENDOSCOPIC RETROGRADE CHOLANGIOPANCREATOGRAPHY (ERCP);  Surgeon: Vida Rigger, MD;  Location: [facility] ENDOSCOPY;  Service: Gastroenterology;  Laterality: N/A;   EYE SURGERY     cataract removal bilaterally   FEMORAL-POPLITEAL BYPASS GRAFT Right 08/16/2020   Procedure: BYPASS GRAFT FEMORAL-POPLITEAL ARTERY;  Surgeon: Maeola Harman, MD;  Location: [facility] OR;  Service: Vascular;  Laterality: Right;   FEMORAL-POPLITEAL BYPASS GRAFT Left 05/03/2021   Procedure: LEFT FEMORAL TO BELOW KNEE POPLITEAL ARTERY BYPASS GRAFTING WITH 6MMX80 PTFE GRAFT;  Surgeon: Leonie Douglas, MD;  [facility];  Service: Vascular;  Laterality: Left;   LEFT HEART CATH AND CORONARY ANGIOGRAPHY N/A 03/22/2020   Procedure: LEFT HEART CATH AND CORONARY ANGIOGRAPHY;  Surgeon: Swaziland, Peter M, MD;   Location: [facility] INVASIVE CV LAB;  Service: Cardiovascular;  Laterality: N/A;   LOWER EXTREMITY ANGIOGRAPHY Bilateral 06/15/2019   Procedure: Lower Extremity Angiography;  Surgeon: Runell Gess, MD;  Location: [facility] INVASIVE CV LAB;  Service: Cardiovascular;  Laterality: Bilateral;   LOWER EXTREMITY ANGIOGRAPHY Right 08/03/2019   Procedure: LOWER EXTREMITY ANGIOGRAPHY;  Surgeon: Runell Gess, MD;  [facility];  Service: Cardiovascular;  Laterality: Right;   LOWER EXTREMITY ANGIOGRAPHY N/A 09/07/2021   Procedure: LOWER EXTREMITY ANGIOGRAPHY;  Surgeon: Cephus Shelling, MD;  [facility];  Service: Cardiovascular;  Laterality: N/A;   PATCH ANGIOPLASTY Right 07/21/2019   Procedure: Patch Angioplasty Right Femoral Artery;  Surgeon: Nada Libman, MD;  Location: [facility] OR;  Service: Vascular;  Laterality: Right;   PERIPHERAL INTRAVASCULAR LITHOTRIPSY  11/25/2019   Procedure: INTRAVASCULAR LITHOTRIPSY;  Surgeon: Iran Ouch, MD;  [facility];  Service: Cardiovascular;;  LT. SFA   PERIPHERAL VASCULAR ATHERECTOMY  08/03/2019   Procedure: PERIPHERAL VASCULAR ATHERECTOMY;  Surgeon: Runell Gess, MD;  Location: [facility] INVASIVE CV LAB;  Service: Cardiovascular;;  right SFA, right TP trunk   PERIPHERAL  VASCULAR ATHERECTOMY  11/25/2019   Procedure: PERIPHERAL VASCULAR ATHERECTOMY;  Surgeon: Iran Ouch, MD;  [facility];  Service: Cardiovascular;;  Lt.  POPLITEAL and AT   PERIPHERAL VASCULAR BALLOON ANGIOPLASTY Left 06/15/2019   Procedure: PERIPHERAL VASCULAR BALLOON ANGIOPLASTY;  Surgeon: Runell Gess, MD;  [facility];  Service: Cardiovascular;  Laterality: Left;  SFA UNSUCCESSFUL UNABLE TO CROSS LESION   PERIPHERAL VASCULAR BALLOON ANGIOPLASTY  08/03/2019   Procedure: PERIPHERAL VASCULAR BALLOON ANGIOPLASTY;  Surgeon: Runell Gess, MD;  [facility];  Service: Cardiovascular;;   right SFA, Right TP trunk   PERIPHERAL VASCULAR BALLOON ANGIOPLASTY  09/07/2021   Procedure: PERIPHERAL VASCULAR BALLOON ANGIOPLASTY;  Surgeon: Cephus Shelling, MD;  [facility];  Service: Cardiovascular;;   PERIPHERAL VASCULAR BALLOON ANGIOPLASTY Right 10/13/2021   Procedure: PERIPHERAL VASCULAR BALLOON ANGIOPLASTY;  Surgeon: Leonie Douglas, MD;  [facility];  Service: Cardiovascular;  Laterality: Right;   PERIPHERAL VASCULAR BALLOON ANGIOPLASTY  11/09/2022   Procedure: PERIPHERAL VASCULAR BALLOON ANGIOPLASTY;  Surgeon: Leonie Douglas, MD;  [facility];  Service: Cardiovascular;;  fem-pop bypass and AT   REMOVAL OF STONES  04/07/2023   Procedure: REMOVAL OF STONES;  Surgeon: Vida Rigger, MD;  Location: [facility] ENDOSCOPY;  Service: Gastroenterology;;   Dennison Mascot  04/07/2023   Procedure: Dennison Mascot;  Surgeon: Vida Rigger, MD;  Location: [facility] ENDOSCOPY;  Service: Gastroenterology;;   TEE WITHOUT CARDIOVERSION N/A 03/24/2020   Procedure: TRANSESOPHAGEAL ECHOCARDIOGRAM (TEE);  Surgeon: Linden Dolin, MD;  Location: [facility] OR;  Service: Open Heart Surgery;  Laterality: N/A;   TONSILLECTOMY     and adenoidectomy   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/2019   Procedure: TRANSMETATARSAL AMPUTATION;  Surgeon: Vivi Barrack, DPM;  [facility];  Service: Podiatry;  Laterality: Right;   TRANSMETATARSAL AMPUTATION Left 05/11/2021   Procedure: LEFT TRANSMETATARSAL AMPUTATION;  Surgeon: Maeola Harman, MD;  Location: [facility] OR;  Service: Vascular;  Laterality: Left;   TUBAL LIGATION      Family History  Problem Relation Age of Onset   Cancer - Prostate Father    Cancer - Colon Father    Stroke Mother    Hypertension Mother    Hyperlipidemia Mother    Melanoma Brother    Social History:  reports that she quit smoking about 51 years ago. Her smoking use included cigarettes. She has never used smokeless tobacco. She reports that  she does not drink alcohol and does not use drugs.  Allergies:  Allergies  Allergen Reactions   Versed [Midazolam] Anxiety    Frantic, out of my mind, agitated    Augmentin [Amoxicillin-Pot Clavulanate] Other (See Comments)    Elevates potassium level   Bactrim [Sulfamethoxazole-Trimethoprim] Other (See Comments)    ^ K+( elevated)    Demerol [Meperidine]     Delusional    Meperidine Hcl     Other reaction(s): delusional   Scopolamine     Delusional    Tramadol Other (See Comments)    Keeps awake    Facility-Administered Medications Prior to Admission  Medication Dose Route Frequency Provider Last Rate Last Admin   sodium chloride flush (NS) 0.9 % injection 3 mL  3 mL Intravenous Q12H Runell Gess, MD       Medications Prior to Admission  Medication Sig Dispense Refill   Ascorbic Acid (VITA-C PO) Take 1 tablet by mouth daily.     aspirin EC 81  MG tablet Take 81 mg by mouth at bedtime. Swallow whole.     atorvastatin (LIPITOR) 80 MG tablet TAKE 1 TABLET BY MOUTH ONCE DAILY. MUST  KEEP  UPCOMING  APPOINTMENT  FOR  FUTURE  REFILLS 90 tablet 0   brimonidine (ALPHAGAN) 0.2 % ophthalmic solution Place 1 drop into the right eye 2 (two) times daily.     Cholecalciferol (VITAMIN D3 GUMMIES PO) Take 2 tablets by mouth at bedtime.     CINNAMON PO Take 1 capsule by mouth in the morning, at noon, and at bedtime.     Cyanocobalamin (B-12 PO) Take 1,000 mcg by mouth daily with supper.     hydrochlorothiazide (HYDRODIURIL) 12.5 MG tablet Take 1 tablet by mouth once daily 90 tablet 0   Insulin Lispro Prot & Lispro (HUMALOG MIX 75/25 KWIKPEN) (75-25) 100 UNIT/ML Kwikpen INJECT 15 UNITS SUBCUTANEOUSLY ONCE DAILY WITH BREAKFAST (Patient taking differently: Inject 4-8 Units into the skin See admin instructions. Inject 8 units into the skin every morning and 4 units every evening.) 15 mL 0   Menatetrenone (VITAMIN K2) 100 MCG TABS Take 100 mcg by mouth at bedtime.     metFORMIN (GLUCOPHAGE) 1000  MG tablet Take 1 tablet by mouth twice daily 180 tablet 3   metoprolol tartrate (LOPRESSOR) 50 MG tablet Take 1 tablet (50 mg total) by mouth 2 (two) times daily. 180 tablet 0   pantoprazole (PROTONIX) 40 MG tablet Take 1 tablet by mouth once daily 90 tablet 3   Propylene Glycol (SYSTANE BALANCE) 0.6 % SOLN Place 1 drop into the left eye 3 (three) times daily as needed (dry/irritated eyes).     repaglinide (PRANDIN) 2 MG tablet Take 1 tab by breakfast and 1 tab before dinner (Patient taking differently: Take 2 mg by mouth daily.) 180 tablet 2   telmisartan (MICARDIS) 80 MG tablet Take 1 tablet by mouth once daily 90 tablet 3   XARELTO 2.5 MG TABS tablet Take 1 tablet by mouth twice daily 60 tablet 0   Insulin Pen Needle (ULTRA-THIN II MINI PEN NEEDLE) 31G X 5 MM MISC USE AS DIRECTED 50 each 2   JARDIANCE 25 MG TABS tablet TAKE 1 TABLET BY MOUTH ONCE DAILY BEFORE BREAKFAST 90 tablet 3   Lancets (ONETOUCH DELICA PLUS LANCET33G) MISC Use to check blood sugars twice daily. (Patient taking differently: Use to check blood sugars  daily.) 100 each 5   lidocaine-prilocaine (EMLA) cream Apply to affected area once 30 g 3   mupirocin ointment (BACTROBAN) 2 % Apply 1 Application topically 2 (two) times daily. (Patient not taking: Reported on 04/23/2023) 30 g 2   ondansetron (ZOFRAN) 8 MG tablet Take 1 tablet (8 mg total) by mouth every 8 (eight) hours as needed for nausea or vomiting. Start on the third day after cisplatin. 30 tablet 1   ONETOUCH VERIO test strip USE TO CHECK BLOOD SUGARS ONCE DAILY 100 each 2   prochlorperazine (COMPAZINE) 10 MG tablet Take 1 tablet (10 mg total) by mouth every 6 (six) hours as needed (Nausea or vomiting). 30 tablet 1    Results for orders placed or performed during the hospital encounter of 04/29/23 (from the past 48 hour(s))  Glucose, capillary     Status: Abnormal   Collection Time: 04/29/23 11:08 AM  Result Value Ref Range   Glucose-Capillary 174 (H) 70 - 99 mg/dL     Comment: Glucose reference range applies only to samples taken after fasting for at least 8 hours.  Comment 1 Notify RN    No results found.  Review of Systems  Blood pressure (!) 115/57, pulse 90, temperature 97.7 F (36.5 C), temperature source Oral, resp. rate 15, weight 60.8 kg, SpO2 100%. Physical Exam Vitals reviewed.  Constitutional:      General: She is not in acute distress.    Appearance: Normal appearance.  HENT:     Head: Normocephalic and atraumatic.  Pulmonary:     Effort: Pulmonary effort is normal. No respiratory distress.  Musculoskeletal:        General: Normal range of motion.  Skin:    General: Skin is warm and dry.     Coloration: Skin is not jaundiced.  Neurological:     General: No focal deficit present.     Mental Status: She is alert and oriented to person, place, and time.      Assessment/Plan 82 yo female with gallbladder cancer. Proceed to OR for portacath insertion. Risks and benefits were reviewed, including risk of pneumothorax. Patient expressed understanding and agrees to proceed with surgery. Plan for CXR in PACU and discharge home. All questions were answered. Ok to resume Xarelto tomorrow evening.  Fritzi Mandes, MD 04/29/2023, 12:01 PM

## 2023-04-29 NOTE — Anesthesia Postprocedure Evaluation (Signed)
Anesthesia Post Note  Patient: [name]  Procedure(s) Performed: INSERTION PORT-A-CATH (Left: Chest)     Patient location during evaluation: PACU Anesthesia Type: General Level of consciousness: awake and alert Pain management: pain level controlled Vital Signs Assessment: post-procedure vital signs reviewed and stable Respiratory status: spontaneous breathing, nonlabored ventilation, respiratory function stable and patient connected to nasal cannula oxygen Cardiovascular status: blood pressure returned to baseline and stable Postop Assessment: no apparent nausea or vomiting Anesthetic complications: no   No notable events documented.  Last Vitals:  Vitals:   04/29/23 1415 04/29/23 1430  BP: (!) 152/52 (!) 154/50  Pulse: 74 72  Resp: 14 11  Temp:    SpO2: 100% 99%    Last Pain:  Vitals:   04/29/23 1430  TempSrc:   PainSc: 0-No pain                 Trevor Iha

## 2023-04-29 NOTE — Transfer of Care (Signed)
Immediate Anesthesia Transfer of Care Note  Patient: [name]  Procedure(s) Performed: INSERTION PORT-A-CATH (Left: Chest)  Patient Location: PACU  Anesthesia Type:General  Level of Consciousness: awake, alert , oriented, and patient cooperative  Airway & Oxygen Therapy: Patient Spontanous Breathing and Patient connected to face mask oxygen  Post-op Assessment: Report given to RN, Post -op Vital signs reviewed and stable, and Patient moving all extremities  Post vital signs: Reviewed and stable  Last Vitals:  Vitals Value Taken Time  BP 127/51 04/29/23 1337  Temp 36.4 C 04/29/23 1337  Pulse 77 04/29/23 1341  Resp 13 04/29/23 1341  SpO2 100 % 04/29/23 1341  Vitals shown include unfiled device data.  Last Pain:  Vitals:   04/29/23 1337  TempSrc:   PainSc: 0-No pain         Complications: No notable events documented.

## 2023-04-29 NOTE — Op Note (Signed)
Date: 04/29/23  Patient: [name] MRN: [id number]  Preoperative Diagnosis: Gallbladder cancer Postoperative Diagnosis: Same  Procedure: Port-a-cath insertion  Surgeon: Sophronia Simas, MD  EBL: Minimal  Anesthesia: General LMA  Specimens: None  Indications: [name] is an 82 yo female who was recently diagnosed with a malignant liver mass after presenting with obstructive jaundice. Biopsy confirmed adenocarcinoma. This appears to be a gallbladder primary based on clinical history and imaging, and is not resectable. The patient is to begin systemic chemotherapy later this week, and presents today for port placement. After a discussion of the risks and benefits of surgery, she agreed to proceed. She requested placement on the left side.  Findings: 8-Fr single-lumen power port placed under fluoroscopic guidance into the left subclavian vein. Total catheter length of 22cm.  Procedure details: Informed consent was obtained in the preoperative area prior to the procedure. The patient was brought to the operating room and placed on the table in the supine position. General anesthesia was induced and appropriate lines and drains were placed for intraoperative monitoring. Perioperative antibiotics were administered per SCIP guidelines. The chest and neck were prepped and draped in the usual sterile fashion. A pre-procedure timeout was taken verifying patient identity, surgical site and procedure to be performed.  The patient was placed in Trendelenberg position and the left subclavian vein was accessed with a large-bore needle. A guidewire was inserted and advanced, and position in the SVC was confirmed fluoroscopically. The needle was removed and the wire was clipped to the drapes to secure its position. A small skin incision was made on the left upper chest wall, incorporating the wire exit site, and a subcutaneous pocket was created with cautery. The port and catheter were then flushed and  brought onto the field. Three 2-0 prolene sutures were used to secure the port in the subcutaneous pocket, but the sutures were not tied down. The port was placed in the pocket and the attached cathether was measured using fluoro - the catheter was placed over the skin adjacent to the guidewire, and marked externally at the cavoatrial junction. The catheter was then cut at this location, which was at 22cm. The dilator and sheath were then advanced over the guidewire under fluoroscopic guidance, and the wire and dilator were removed. The end of the catheter was inserted through the sheath and advanced, and the sheath was peeled away. The port was then accessed with a Huber needle, and blood was aspirated and the port was flushed with heparinized saline. A final fluoroscopic image confirmed appropriate position of the catheter tip within the SVC, without kinking of the catheter. The prolene sutures were tied down. The skin was closed with a deep dermal layer of interrupted 3-0 Vicryl suture, followed by a running subcuticular 4-0 monocryl suture. A final flush of 500 units heparin (100 units/mL) was given via the port. Dermabond was applied.  The patient tolerated the procedure well with no apparent complications. All counts were correct x2 at the end of the procedure. The patient was extubated and taken to PACU in stable condition.  Sophronia Simas, MD 04/29/23 1:35 PM

## 2023-04-29 NOTE — Progress Notes (Signed)
[facility] Clinical Social Work  Initial Assessment   [name] is a 81 y.o. year old female contacted by phone. Clinical Social Work was referred by medical provider for assessment of psychosocial needs.   SDOH (Social Determinants of Health) assessments performed: Yes   SDOH Screenings   Food Insecurity: No Food Insecurity (04/05/2023)  Housing: Low Risk  (04/05/2023)  Transportation Needs: No Transportation Needs (04/05/2023)  Utilities: Not At Risk (04/05/2023)  Depression (PHQ2-9): Low Risk  (11/27/2021)  Social Connections: Unknown (02/02/2022)   Received from [facility]  Tobacco Use: Medium Risk (04/25/2023)     Distress Screen completed: No     No data to display            Family/Social Information:  Housing Arrangement: patient lives with her daughter.  Family members/support persons in your life? Pt has another daughter who resides locally and a son residing in [place] who are also supportive.  Pt reports her church community also offers a great deal of support.   Transportation concerns: Pt does not presently have transportation concerns, but appreciative of knowing if her family/friends are unable there are transportation services available  Employment: Retired   Income source: Actor concerns: No Type of concern: None Food access concerns: no Religious or spiritual practice: Data processing manager Currently in place:  none  Coping/ Adjustment to diagnosis: Patient understands treatment plan and what happens next? yes Concerns about diagnosis and/or treatment:  no concerns reported at this time Patient reported stressors:  no stressors reported Hopes and/or priorities: Pt's priority is to start treatment w/ the hope of positive results Patient enjoys time with family/ friends Current coping skills/ strengths: Motivation for treatment/growth , Religious Affiliation , and Supportive family/friends     SUMMARY: Current SDOH  Barriers:  No barriers identified at this time  Clinical Social Work Clinical Goal(s):  No clinical social work goals at this time  Interventions: Discussed common feeling and emotions when being diagnosed with cancer, and the importance of support during treatment Informed patient of the support team roles and support services at [facility] Provided CSW contact information and encouraged patient to call with any questions or concerns Provided patient with information about transportation services should transportation become an issue at any point during treatment.   Follow Up Plan: Patient will contact CSW with any support or resource needs Patient verbalizes understanding of plan: Yes    Rachel Moulds, LCSW Clinical Social Worker [facility]

## 2023-04-29 NOTE — Anesthesia Procedure Notes (Signed)
Procedure Name: LMA Insertion Date/Time: 04/29/2023 12:51 PM  Performed by: Johnette Abraham, CRNAPre-anesthesia Checklist: Patient identified, Emergency Drugs available, Suction available and Patient being monitored Patient Re-evaluated:Patient Re-evaluated prior to induction Oxygen Delivery Method: Circle System Utilized Preoxygenation: Pre-oxygenation with 100% oxygen Induction Type: IV induction Ventilation: Mask ventilation without difficulty LMA: LMA inserted LMA Size: 3.0 Number of attempts: 1 Airway Equipment and Method: Bite block Placement Confirmation: positive ETCO2 Tube secured with: Tape Dental Injury: Teeth and Oropharynx as per pre-operative assessment

## 2023-04-30 ENCOUNTER — Encounter (HOSPITAL_COMMUNITY): Payer: Self-pay | Admitting: Surgery

## 2023-05-01 ENCOUNTER — Other Ambulatory Visit: Payer: Self-pay

## 2023-05-01 DIAGNOSIS — C23 Malignant neoplasm of gallbladder: Secondary | ICD-10-CM

## 2023-05-01 NOTE — Assessment & Plan Note (Signed)
-  Follow-up with PCP and other specialists -Continue medication, -Will monitor her blood glucose and blood pressure closely during her chemotherapy

## 2023-05-01 NOTE — Progress Notes (Unsigned)
[facility]   Telephone:(336) [phone] Fax:(336) [phone]   Clinic Follow up Note   Patient Care Team: Shon Hale, MD as PCP - General (Family Medicine) Runell Gess, MD as PCP - Cardiology (Cardiology) Malachy Mood, MD as Consulting Physician (Oncology)  Date of Service:  05/02/2023  CHIEF COMPLAINT: f/u of  gall bladder cancer   CURRENT THERAPY:  First line chemotherapy cisplatin, gemcitabine every 14 days, durvalumab every 28 days  ASSESSMENT:  [name] is a 83 y.o. female with   Gallbladder cancer (HCC) cT4N0M0, stage III -Patient had 1 episode of abdominal pain in October 2023, and was found to have a large gallbladder stone. -She underwent laparoscopic attempted cholecystectomy but was not able to be resected due to adhesions to due to them in the colon. -She developed jaundice in June 2024, status post ERCP and stent placement in CBD.  CT and MRI scan showed a liver mass adjacent to the gallbladder fossae, and a biopsy confirmed adenocarcinoma.  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  Unfortunately this was felt to be not resectable by Dr. Freida Busman. -I reviewed her CT chest from April 22, 2023, which showed no evidence of metastasis. -I will offer her first-line cisplatin, gemcitabine  every 14 days and durvalumab every 28 days.  If she responded well, will stop chemo after 4 to 6 months of treatment, and changed to maintenance therapy.   Essential hypertension -Follow-up with PCP  -Continue medication, -Will monitor her blood glucose and blood pressure closely during her chemotherapy    DM2 (diabetes mellitus, type 2) (HCC) -Follow-up with PCP and other specialists -Continue medication, -Will monitor her blood glucose and blood pressure closely during her chemotherapy      PLAN: -Lab reviewed, adequate for treatment, will proceed  for cycle treatment tomorrow, will slightly reduce her chemo dose to see how she tolerates. -Lab, follow-up and cycle 2 treatment in 2 weeks   SUMMARY OF ONCOLOGIC HISTORY: Oncology History  Gallbladder cancer (HCC)  04/04/2023 Imaging    IMPRESSION: 1. Ill-defined hypodensity in the region of the gallbladder fossa appears similar to the prior study given lack of contrast. This would be better evaluated with dedicated MRI. 2. Nonobstructing right renal calculus. 3. Large amount of stool throughout the colon. 4. Stable indeterminate lucent areas in the vertebral bodies of the lumbar spine. 5. Severe atherosclerotic disease.     04/09/2023 Pathology Results     FINAL MICROSCOPIC DIAGNOSIS:   A. LIVER, ADJACENT TO GALLBLADDER FOSSA, BIOPSY:  - Moderately differentiated adenocarcinoma    04/09/2023 Cancer Staging   Staging form: Distal Bile Duct, AJCC 8th Edition - Clinical stage from 04/09/2023: Stage IIIB (cT4, cN0, cM0) - Signed by Malachy Mood, MD on 04/18/2023 Total positive nodes: 0 Histologic grade (G): G2 Histologic grading system: 3 grade system   04/18/2023 Initial Diagnosis   Cholangiocarcinoma of liver (HCC)   05/02/2023 -  Chemotherapy   Patient is on Treatment Plan : BILIARY TRACT Cisplatin + Gemcitabine D1,15 + Durvalumab (1500) D1 q28d / Durvalumab (1500) q28d        INTERVAL HISTORY:  [name] is here for a follow up of gallbladder cancer. She was last seen by me on 04/18/2023. She presents to the clinic accompanied by her daughter.  She is overall doing well, still has a mild pain in the right upper quadrant, but does not  need a pain medication.  Her appetite is fair, no nausea, no other new complaints.   All other systems were reviewed with the patient and are negative.  MEDICAL HISTORY:  Past Medical History:  Diagnosis Date   Anemia    after CABG in june 2021   Arthritis    Cancer [facility])    removal from nose - MOSE procedure    Cholangiocarcinoma of  liver (HCC) 03/2023   Complication of anesthesia    VERSED- agitated, muscle spasms, "jerking" , frantic , (never had this occurence in the pas)    Coronary artery disease    Diabetes mellitus without complication (HCC)    Dysrhythmia    PVC's   GERD (gastroesophageal reflux disease)    Heart murmur    History of hiatal hernia    Hyperlipidemia    Hypertension 11/20/2011   ECHO- EF>55% Borderline concentric left ventricular hypertrophy. There is a small calcified mass in the L:A near the LA appendage. No valvular masses seen with associated mitral annular calcification. LA Volume/ BSA27.4 ml/m2 No AS. Right ventricular systolic pressure is elevated at .   Jaundice    Myocardial infarction [facility])    June 2021   Neuromuscular disorder (HCC)    neuropathy in bilateral feet   Peripheral vascular disease (HCC)    Pneumonia    not hosp.    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  PTCAs with drug coated balloon Lt ant tibial artery and Lt popliteal artery  11/26/2019    SURGICAL HISTORY: Past Surgical History:  Procedure Laterality Date   ABDOMINAL AORTAGRAM  11/25/2019   ABDOMINAL AORTOGRAM W/LOWER EXTREMITY    ABDOMINAL AORTOGRAM N/A 06/15/2019   Procedure: ABDOMINAL AORTOGRAM;  Surgeon: Runell Gess, MD;  [facility] IN[facility];  Service: Cardiovascular;  Laterality: N/A;   ABDOMINAL AORTOGRAM W/LOWER EXTREMITY N/A 11/25/2019   Procedure: ABDOMINAL AORTOGRAM W/LOWER EXTREMITY;  Surgeon: Iran Ouch, MD;  [facility];  Service: Cardiovascular;  Laterality: N/A;  Lt leg   ABDOMINAL AORTOGRAM W/LOWER EXTREMITY N/A 08/01/2020   Procedure: ABDOMINAL AORTOGRAM W/LOWER EXTREMITY;  Surgeon: Runell Gess, MD;  [facility];  Service: Cardiovascular;  Laterality: N/A;   ABDOMINAL AORTOGRAM W/LOWER EXTREMITY N/A 04/28/2021   Procedure: ABDOMINAL AORTOGRAM W/LOWER EXTREMITY;  Surgeon: Sherren Kerns, MD;  [facility]  INV[facility];  Service: Cardiovascular;  Laterality: N/A;   ABDOMINAL AORTOGRAM W/LOWER EXTREMITY N/A 10/13/2021   Procedure: ABDOMINAL AORTOGRAM W/LOWER EXTREMITY;  Surgeon: Leonie Douglas, MD;  [facility];  Service: Cardiovascular;  Laterality: N/A;   ABDOMINAL AORTOGRAM W/LOWER EXTREMITY N/A 11/09/2022   Procedure: ABDOMINAL AORTOGRAM W/LOWER EXTREMITY;  Surgeon: Leonie Douglas, MD;  [facility];  Service: Cardiovascular;  Laterality: N/A;   ABDOMINAL AORTOGRAM W/LOWER EXTREMITY N/A 11/16/2022   Procedure: ABDOMINAL AORTOGRAM W/LOWER EXTREMITY;  Surgeon: Leonie Douglas, MD;  [facility];  Service: Cardiovascular;  Laterality: N/A;   AMPUTATION TOE Left 05/03/2021   Procedure: AMPUTATION OF THIRD LEFT  TOE;  Surgeon: Leonie Douglas, MD;  Location: [facility] OR;  Service: Vascular;  Laterality: Left;   APPENDECTOMY     APPLICATION OF WOUND VAC Right 07/21/2019   Procedure: Application Of Prevena Wound Vac Right Groin;  Surgeon: Nada Libman, MD;  Location: [facility] OR;  Service: Vascular;  Laterality: Right;   BILIARY BRUSHING  04/07/2023   Procedure: BILIARY BRUSHING;  Surgeon:  Vida Rigger, MD;  Location: [facility] ENDOSCOPY;  Service: Gastroenterology;;   BILIARY STENT PLACEMENT  04/07/2023   Procedure: BILIARY STENT PLACEMENT;  Surgeon: Vida Rigger, MD;  Location: [facility] ENDOSCOPY;  Service: Gastroenterology;;   CARPAL TUNNEL RELEASE Left    CARPAL TUNNEL RELEASE Right    CESAREAN SECTION     x 2   CHOLECYSTECTOMY N/A 10/12/2022   Procedure: LAPAROSCOPIC PARTIAL FENESTRATING CHOLECYSTECTOMY;  Surgeon: Fritzi Mandes, MD;  [facility];  Service: General;  Laterality: N/A;   COLONOSCOPY     CORONARY ARTERY BYPASS GRAFT N/A 03/24/2020   Procedure: CORONARY ARTERY BYPASS GRAFTING (CABG) using LIMA to LAD (m); RIMA to RAMUS; Endoscopic Right Greater Saphenous Vein: SVG to Diag1; SVG to PLB (right); and SVG to PL (left).;  Surgeon: Linden Dolin,  MD;  [facility] OR;  Service: Open Heart Surgery;  Laterality: N/A;  BILATERAL IMA   ENDARTERECTOMY FEMORAL Right 07/21/2019   Procedure: RIGHT ENDARTERECTOMY FEMORAL WITH PATCH ANGIOPLASTY;  Surgeon: Nada Libman, MD;  [facility];  Service: Vascular;  Laterality: Right;   ENDARTERECTOMY FEMORAL Right 08/16/2020   Procedure: RIGHT FEMORAL ENDARTERECTOMY;  Surgeon: Maeola Harman, MD;  Location: [facility] OR;  Service: Vascular;  Laterality: Right;   ENDOVEIN HARVEST OF GREATER SAPHENOUS VEIN Right 03/24/2020   Procedure: ENDOVEIN HARVEST OF GREATER SAPHENOUS VEIN;  Surgeon: Linden Dolin, MD;  [facility];  Service: Open Heart Surgery;  Laterality: Right;   ERCP N/A 04/07/2023   Procedure: ENDOSCOPIC RETROGRADE CHOLANGIOPANCREATOGRAPHY (ERCP);  Surgeon: Vida Rigger, MD;  Location: [facility] ENDOSCOPY;  Service: Gastroenterology;  Laterality: N/A;   EYE SURGERY     cataract removal bilaterally   FEMORAL-POPLITEAL BYPASS GRAFT Right 08/16/2020   Procedure: BYPASS GRAFT FEMORAL-POPLITEAL ARTERY;  Surgeon: Maeola Harman, MD;  Location: [facility] OR;  Service: Vascular;  Laterality: Right;   FEMORAL-POPLITEAL BYPASS GRAFT Left 05/03/2021   Procedure: LEFT FEMORAL TO BELOW KNEE POPLITEAL ARTERY BYPASS GRAFTING WITH 6MMX80 PTFE GRAFT;  Surgeon: Leonie Douglas, MD;  [facility];  Service: Vascular;  Laterality: Left;   LEFT HEART CATH AND CORONARY ANGIOGRAPHY N/A 03/22/2020   Procedure: LEFT HEART CATH AND CORONARY ANGIOGRAPHY;  Surgeon: Swaziland, Peter M, MD;  Location: [facility] INVASIVE CV LAB;  Service: Cardiovascular;  Laterality: N/A;   LOWER EXTREMITY ANGIOGRAPHY Bilateral 06/15/2019   Procedure: Lower Extremity Angiography;  Surgeon: Runell Gess, MD;  Location: [facility] INVASIVE CV LAB;  Service: Cardiovascular;  Laterality: Bilateral;   LOWER EXTREMITY ANGIOGRAPHY Right 08/03/2019   Procedure: LOWER EXTREMITY ANGIOGRAPHY;  Surgeon: Runell Gess, MD;  [facility];  Service: Cardiovascular;  Laterality: Right;   LOWER EXTREMITY ANGIOGRAPHY N/A 09/07/2021   Procedure: LOWER EXTREMITY ANGIOGRAPHY;  Surgeon: Cephus Shelling, MD;  [facility];  Service: Cardiovascular;  Laterality: N/A;   PATCH ANGIOPLASTY Right 07/21/2019   Procedure: Patch Angioplasty Right Femoral Artery;  Surgeon: Nada Libman, MD;  Location: [facility] OR;  Service: Vascular;  Laterality: Right;   PERIPHERAL INTRAVASCULAR LITHOTRIPSY  11/25/2019   Procedure: INTRAVASCULAR LITHOTRIPSY;  Surgeon: Iran Ouch, MD;  [facility];  Service: Cardiovascular;;  LT. SFA   PERIPHERAL VASCULAR ATHERECTOMY  08/03/2019   Procedure: PERIPHERAL VASCULAR ATHERECTOMY;  Surgeon: Runell Gess, MD;  Location: [facility] INVASIVE CV LAB;  Service: Cardiovascular;;  right SFA, right TP trunk   PERIPHERAL VASCULAR ATHERECTOMY  11/25/2019   Procedure: PERIPHERAL VASCULAR ATHERECTOMY;  Surgeon: Lorine Bears  A, MD;  [facility];  Service: Cardiovascular;;  Lt.  POPLITEAL and AT   PERIPHERAL VASCULAR BALLOON ANGIOPLASTY Left 06/15/2019   Procedure: PERIPHERAL VASCULAR BALLOON ANGIOPLASTY;  Surgeon: Runell Gess, MD;  [facility];  Service: Cardiovascular;  Laterality: Left;  SFA UNSUCCESSFUL UNABLE TO CROSS LESION   PERIPHERAL VASCULAR BALLOON ANGIOPLASTY  08/03/2019   Procedure: PERIPHERAL VASCULAR BALLOON ANGIOPLASTY;  Surgeon: Runell Gess, MD;  [facility];  Service: Cardiovascular;;  right SFA, Right TP trunk   PERIPHERAL VASCULAR BALLOON ANGIOPLASTY  09/07/2021   Procedure: PERIPHERAL VASCULAR BALLOON ANGIOPLASTY;  Surgeon: Cephus Shelling, MD;  [facility];  Service: Cardiovascular;;   PERIPHERAL VASCULAR BALLOON ANGIOPLASTY Right 10/13/2021   Procedure: PERIPHERAL VASCULAR BALLOON ANGIOPLASTY;  Surgeon: Leonie Douglas, MD;  [facility];  Service: Cardiovascular;   Laterality: Right;   PERIPHERAL VASCULAR BALLOON ANGIOPLASTY  11/09/2022   Procedure: PERIPHERAL VASCULAR BALLOON ANGIOPLASTY;  Surgeon: Leonie Douglas, MD;  [facility];  Service: Cardiovascular;;  fem-pop bypass and AT   PORTACATH PLACEMENT Left 04/29/2023   Procedure: INSERTION PORT-A-CATH;  Surgeon: Fritzi Mandes, MD;  Location: [facility] ORS;  Service: General;  Laterality: Left;   REMOVAL OF STONES  04/07/2023   Procedure: REMOVAL OF STONES;  Surgeon: Vida Rigger, MD;  Location[facility] ENDOSCOPY;  Service: Gastroenterology;;   Dennison Mascot  04/07/2023   Procedure: Dennison Mascot;  Surgeon: Vida Rigger, MD;  Location: [facility] ENDOSCOPY;  Service: Gastroenterology;;   TEE WITHOUT CARDIOVERSION N/A 03/24/2020   Procedure: TRANSESOPHAGEAL ECHOCARDIOGRAM (TEE);  Surgeon: Linden Dolin, MD;  Location: [facility] OR;  Service: Open Heart Surgery;  Laterality: N/A;   TONSILLECTOMY     and adenoidectomy   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/2019   Procedure: TRANSMETATARSAL AMPUTATION;  Surgeon: Vivi Barrack, DPM;  [facility] OR;  Service: Podiatry;  Laterality: Right;   TRANSMETATARSAL AMPUTATION Left 05/11/2021   Procedure: LEFT TRANSMETATARSAL AMPUTATION;  Surgeon: Maeola Harman, MD;  Location: [facility] OR;  Service: Vascular;  Laterality: Left;   TUBAL LIGATION      I have reviewed the social history and family history with the patient and they are unchanged from previous note.  ALLERGIES:  is allergic to versed [midazolam], augmentin [amoxicillin-pot clavulanate], bactrim [sulfamethoxazole-trimethoprim], demerol [meperidine], meperidine hcl, scopolamine, and tramadol.  MEDICATIONS:  Current Outpatient Medications  Medication Sig Dispense Refill   Ascorbic Acid (VITA-C PO) Take 1 tablet by mouth daily.     aspirin EC 81 MG tablet Take 81 mg by mouth at bedtime. Swallow whole.     atorvastatin (LIPITOR) 80 MG tablet TAKE 1 TABLET BY MOUTH ONCE DAILY. MUST  KEEP  UPCOMING   APPOINTMENT  FOR  FUTURE  REFILLS 90 tablet 0   brimonidine (ALPHAGAN) 0.2 % ophthalmic solution Place 1 drop into the right eye 2 (two) times daily.     Cholecalciferol (VITAMIN D3 GUMMIES PO) Take 2 tablets by mouth at bedtime.     CINNAMON PO Take 1 capsule by mouth in the morning, at noon, and at bedtime.     Cyanocobalamin (B-12 PO) Take 1,000 mcg by mouth daily with supper.     hydrochlorothiazide (HYDRODIURIL) 12.5 MG tablet Take 1 tablet by mouth once daily 90 tablet 0   Insulin Lispro Prot & Lispro (HUMALOG MIX 75/25 KWIKPEN) (75-25) 100 UNIT/ML Kwikpen INJECT 15 UNITS SUBCUTANEOUSLY ONCE DAILY WITH BREAKFAST (Patient taking differently: Inject 4-8 Units into  the skin See admin instructions. Inject 8 units into the skin every morning and 4 units every evening.) 15 mL 0   Insulin Pen Needle (ULTRA-THIN II MINI PEN NEEDLE) 31G X 5 MM MISC USE AS DIRECTED 50 each 2   JARDIANCE 25 MG TABS tablet TAKE 1 TABLET BY MOUTH ONCE DAILY BEFORE BREAKFAST 90 tablet 3   Lancets (ONETOUCH DELICA PLUS LANCET33G) MISC Use to check blood sugars twice daily. (Patient taking differently: Use to check blood sugars  daily.) 100 each 5   lidocaine-prilocaine (EMLA) cream Apply to affected area once 30 g 3   Menatetrenone (VITAMIN K2) 100 MCG TABS Take 100 mcg by mouth at bedtime.     metFORMIN (GLUCOPHAGE) 1000 MG tablet Take 1 tablet by mouth twice daily 180 tablet 3   metoprolol tartrate (LOPRESSOR) 50 MG tablet Take 1 tablet (50 mg total) by mouth 2 (two) times daily. 180 tablet 0   mupirocin ointment (BACTROBAN) 2 % Apply 1 Application topically 2 (two) times daily. (Patient not taking: Reported on 04/23/2023) 30 g 2   ondansetron (ZOFRAN) 8 MG tablet Take 1 tablet (8 mg total) by mouth every 8 (eight) hours as needed for nausea or vomiting. Start on the third day after cisplatin. 30 tablet 1   ONETOUCH VERIO test strip USE TO CHECK BLOOD SUGARS ONCE DAILY 100 each 2   pantoprazole (PROTONIX) 40 MG tablet Take  1 tablet by mouth once daily 90 tablet 3   prochlorperazine (COMPAZINE) 10 MG tablet Take 1 tablet (10 mg total) by mouth every 6 (six) hours as needed (Nausea or vomiting). 30 tablet 1   Propylene Glycol (SYSTANE BALANCE) 0.6 % SOLN Place 1 drop into the left eye 3 (three) times daily as needed (dry/irritated eyes).     repaglinide (PRANDIN) 2 MG tablet Take 1 tab by breakfast and 1 tab before dinner (Patient taking differently: Take 2 mg by mouth daily.) 180 tablet 2   telmisartan (MICARDIS) 80 MG tablet Take 1 tablet by mouth once daily 90 tablet 3   XARELTO 2.5 MG TABS tablet Take 1 tablet by mouth twice daily 60 tablet 0   No current facility-administered medications for this visit.    PHYSICAL EXAMINATION: ECOG PERFORMANCE STATUS: 1 - Symptomatic but completely ambulatory  Vitals:   05/02/23 0827  BP: (!) 126/55  Pulse: 95  Resp: 16  Temp: 98.5 F (36.9 C)  SpO2: 98%   Wt Readings from Last 3 Encounters:  05/02/23 137 lb (62.1 kg)  04/29/23 134 lb (60.8 kg)  04/25/23 134 lb (60.8 kg)     GENERAL:alert, no distress and comfortable SKIN: skin color, texture, turgor are normal, no rashes or significant lesions EYES: normal, Conjunctiva are pink and non-injected, sclera clear NECK: supple, thyroid normal size, non-tender, without nodularity LYMPH:  no palpable lymphadenopathy in the cervical, axillary  LUNGS: clear to auscultation and percussion with normal breathing effort HEART: regular rate & rhythm and no murmurs and no lower extremity edema ABDOMEN:abdomen soft, non-tender and normal bowel sounds Musculoskeletal:no cyanosis of digits and no clubbing  NEURO: alert & oriented x 3 with fluent speech, no focal motor/sensory deficits  LABORATORY DATA:  I have reviewed the data as listed    Latest Ref Rng & Units 05/02/2023    7:52 AM 04/09/2023    3:01 AM 04/08/2023    7:32 AM  CBC  WBC 4.0 - 10.5 K/uL 14.2  9.3  8.4   Hemoglobin 12.0 - 15.0 g/dL 10.7  9.0  9.4    Hematocrit 36.0 - 46.0 % 32.8  26.5  27.6   Platelets 150 - 400 K/uL 321  174  175         Latest Ref Rng & Units 05/02/2023    7:52 AM 04/09/2023    3:01 AM 04/08/2023    7:32 AM  CMP  Glucose 70 - 99 mg/dL 409  811  914   BUN 8 - 23 mg/dL 43  18  21   Creatinine 0.44 - 1.00 mg/dL 7.82  9.56  2.13   Sodium 135 - 145 mmol/L 139  139  137   Potassium 3.5 - 5.1 mmol/L 4.1  3.5  3.8   Chloride 98 - 111 mmol/L 103  112  111   CO2 22 - 32 mmol/L 25  20  19    Calcium 8.9 - 10.3 mg/dL 08.6  9.1  9.1   Total Protein 6.5 - 8.1 g/dL 7.1  5.2  5.3   Total Bilirubin 0.3 - 1.2 mg/dL 1.2  3.6  5.6   Alkaline Phos 38 - 126 U/L 310  489  498   AST 15 - 41 U/L 32  81  109   ALT 0 - 44 U/L 20  89  103       RADIOGRAPHIC STUDIES: I have personally reviewed the radiological images as listed and agreed with the findings in the report. No results found.    No orders of the defined types were placed in this encounter.  All questions were answered. The patient knows to call the clinic with any problems, questions or concerns. No barriers to learning was detected. The total time spent in the appointment was 25 minutes.     Malachy Mood, MD 05/02/2023

## 2023-05-01 NOTE — Assessment & Plan Note (Addendum)
cT4N0M0, stage III -Patient had 1 episode of abdominal pain in October 2023, and was found to have a large gallbladder stone. -She underwent laparoscopic attempted cholecystectomy but was not able to be resected due to adhesions to due to them in the colon. -She developed jaundice in June 2024, status post ERCP and stent placement in CBD.  CT and MRI scan showed a liver mass adjacent to the gallbladder fossae, and a biopsy confirmed adenocarcinoma.  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  Unfortunately this was felt to be not resectable by Dr. Freida Busman. -I reviewed her CT chest from April 22, 2023, which showed no evidence of metastasis. -I will offer her first-line cisplatin, gemcitabine  every 14 days and durvalumab every 28 days.  If she responded well, will stop chemo after 4 to 6 months of treatment, and changed to maintenance therapy.

## 2023-05-01 NOTE — Assessment & Plan Note (Signed)
-  Follow-up with PCP  -Continue medication, -Will monitor her blood glucose and blood pressure closely during her chemotherapy

## 2023-05-02 ENCOUNTER — Inpatient Hospital Stay (HOSPITAL_BASED_OUTPATIENT_CLINIC_OR_DEPARTMENT_OTHER): Payer: Medicare Other | Admitting: Hematology

## 2023-05-02 ENCOUNTER — Inpatient Hospital Stay: Payer: Medicare Other

## 2023-05-02 ENCOUNTER — Other Ambulatory Visit: Payer: Self-pay

## 2023-05-02 VITALS — BP 126/55 | HR 95 | Temp 98.5°F | Resp 16 | Ht 65.0 in | Wt 137.0 lb

## 2023-05-02 DIAGNOSIS — C23 Malignant neoplasm of gallbladder: Secondary | ICD-10-CM | POA: Diagnosis not present

## 2023-05-02 DIAGNOSIS — Z5112 Encounter for antineoplastic immunotherapy: Secondary | ICD-10-CM | POA: Diagnosis not present

## 2023-05-02 DIAGNOSIS — I10 Essential (primary) hypertension: Secondary | ICD-10-CM

## 2023-05-02 DIAGNOSIS — E119 Type 2 diabetes mellitus without complications: Secondary | ICD-10-CM | POA: Diagnosis not present

## 2023-05-02 DIAGNOSIS — C221 Intrahepatic bile duct carcinoma: Secondary | ICD-10-CM | POA: Diagnosis not present

## 2023-05-02 DIAGNOSIS — Z95828 Presence of other vascular implants and grafts: Secondary | ICD-10-CM | POA: Insufficient documentation

## 2023-05-02 DIAGNOSIS — Z5111 Encounter for antineoplastic chemotherapy: Secondary | ICD-10-CM | POA: Diagnosis not present

## 2023-05-02 DIAGNOSIS — E1142 Type 2 diabetes mellitus with diabetic polyneuropathy: Secondary | ICD-10-CM | POA: Diagnosis not present

## 2023-05-02 DIAGNOSIS — Z452 Encounter for adjustment and management of vascular access device: Secondary | ICD-10-CM | POA: Diagnosis not present

## 2023-05-02 LAB — CBC WITH DIFFERENTIAL (CANCER CENTER ONLY)
Abs Immature Granulocytes: 0.06 10*3/uL (ref 0.00–0.07)
Basophils Absolute: 0.1 10*3/uL (ref 0.0–0.1)
Basophils Relative: 1 %
Eosinophils Absolute: 0.3 10*3/uL (ref 0.0–0.5)
Eosinophils Relative: 2 %
HCT: 32.8 % — ABNORMAL LOW (ref 36.0–46.0)
Hemoglobin: 10.7 g/dL — ABNORMAL LOW (ref 12.0–15.0)
Immature Granulocytes: 0 %
Lymphocytes Relative: 12 %
Lymphs Abs: 1.7 10*3/uL (ref 0.7–4.0)
MCH: 28.9 pg (ref 26.0–34.0)
MCHC: 32.6 g/dL (ref 30.0–36.0)
MCV: 88.6 fL (ref 80.0–100.0)
Monocytes Absolute: 1.2 10*3/uL — ABNORMAL HIGH (ref 0.1–1.0)
Monocytes Relative: 9 %
Neutro Abs: 10.8 10*3/uL — ABNORMAL HIGH (ref 1.7–7.7)
Neutrophils Relative %: 76 %
Platelet Count: 321 10*3/uL (ref 150–400)
RBC: 3.7 MIL/uL — ABNORMAL LOW (ref 3.87–5.11)
RDW: 14.6 % (ref 11.5–15.5)
WBC Count: 14.2 10*3/uL — ABNORMAL HIGH (ref 4.0–10.5)
nRBC: 0 % (ref 0.0–0.2)

## 2023-05-02 LAB — CMP (CANCER CENTER ONLY)
ALT: 20 U/L (ref 0–44)
AST: 32 U/L (ref 15–41)
Albumin: 3.8 g/dL (ref 3.5–5.0)
Alkaline Phosphatase: 310 U/L — ABNORMAL HIGH (ref 38–126)
Anion gap: 11 (ref 5–15)
BUN: 43 mg/dL — ABNORMAL HIGH (ref 8–23)
CO2: 25 mmol/L (ref 22–32)
Calcium: 10.2 mg/dL (ref 8.9–10.3)
Chloride: 103 mmol/L (ref 98–111)
Creatinine: 1.15 mg/dL — ABNORMAL HIGH (ref 0.44–1.00)
GFR, Estimated: 48 mL/min — ABNORMAL LOW (ref >60)
Glucose, Bld: 212 mg/dL — ABNORMAL HIGH (ref 70–99)
Potassium: 4.1 mmol/L (ref 3.5–5.1)
Sodium: 139 mmol/L (ref 135–145)
Total Bilirubin: 1.2 mg/dL (ref 0.3–1.2)
Total Protein: 7.1 g/dL (ref 6.5–8.1)

## 2023-05-02 LAB — MISCELLANEOUS TEST

## 2023-05-02 LAB — TSH: TSH: 0.884 u[IU]/mL (ref 0.350–4.500)

## 2023-05-02 LAB — MAGNESIUM: Magnesium: 1.3 mg/dL — ABNORMAL LOW (ref 1.7–2.4)

## 2023-05-02 MED ORDER — HEPARIN SOD (PORK) LOCK FLUSH 100 UNIT/ML IV SOLN
500.0000 [IU] | Freq: Once | INTRAVENOUS | Status: AC
  Administered 2023-05-02: 500 [IU]

## 2023-05-02 MED ORDER — SODIUM CHLORIDE 0.9% FLUSH
10.0000 mL | Freq: Once | INTRAVENOUS | Status: AC
  Administered 2023-05-02: 10 mL

## 2023-05-02 MED FILL — Fosaprepitant Dimeglumine For IV Infusion 150 MG (Base Eq): INTRAVENOUS | Qty: 5 | Status: AC

## 2023-05-02 NOTE — Addendum Note (Signed)
Addended by: Malachy Mood on: 05/02/2023 10:40 AM   Modules accepted: Orders

## 2023-05-03 ENCOUNTER — Inpatient Hospital Stay: Payer: Medicare Other

## 2023-05-03 ENCOUNTER — Encounter: Payer: Self-pay | Admitting: Hematology

## 2023-05-03 ENCOUNTER — Other Ambulatory Visit: Payer: Self-pay

## 2023-05-03 VITALS — BP 163/55 | HR 88 | Temp 98.2°F | Resp 18 | Wt 137.1 lb

## 2023-05-03 DIAGNOSIS — C23 Malignant neoplasm of gallbladder: Secondary | ICD-10-CM

## 2023-05-03 DIAGNOSIS — Z5112 Encounter for antineoplastic immunotherapy: Secondary | ICD-10-CM | POA: Diagnosis not present

## 2023-05-03 DIAGNOSIS — E119 Type 2 diabetes mellitus without complications: Secondary | ICD-10-CM | POA: Diagnosis not present

## 2023-05-03 DIAGNOSIS — I10 Essential (primary) hypertension: Secondary | ICD-10-CM | POA: Diagnosis not present

## 2023-05-03 DIAGNOSIS — Z5111 Encounter for antineoplastic chemotherapy: Secondary | ICD-10-CM | POA: Diagnosis not present

## 2023-05-03 DIAGNOSIS — C221 Intrahepatic bile duct carcinoma: Secondary | ICD-10-CM | POA: Diagnosis not present

## 2023-05-03 MED ORDER — SODIUM CHLORIDE 0.9 % IV SOLN
800.0000 mg/m2 | Freq: Once | INTRAVENOUS | Status: AC
  Administered 2023-05-03: 1369 mg via INTRAVENOUS
  Filled 2023-05-03: qty 36.01

## 2023-05-03 MED ORDER — POTASSIUM CHLORIDE IN NACL 20-0.9 MEQ/L-% IV SOLN
Freq: Once | INTRAVENOUS | Status: AC
  Filled 2023-05-03: qty 1000

## 2023-05-03 MED ORDER — MAGNESIUM SULFATE 2 GM/50ML IV SOLN
2.0000 g | Freq: Once | INTRAVENOUS | Status: AC
  Administered 2023-05-03: 2 g via INTRAVENOUS
  Filled 2023-05-03: qty 50

## 2023-05-03 MED ORDER — SODIUM CHLORIDE 0.9 % IV SOLN
1500.0000 mg | Freq: Once | INTRAVENOUS | Status: AC
  Administered 2023-05-03: 1500 mg via INTRAVENOUS
  Filled 2023-05-03: qty 30

## 2023-05-03 MED ORDER — SODIUM CHLORIDE 0.9 % IV SOLN: Freq: Once | INTRAVENOUS | Status: AC

## 2023-05-03 MED ORDER — SODIUM CHLORIDE 0.9 % IV SOLN
150.0000 mg | Freq: Once | INTRAVENOUS | Status: AC
  Administered 2023-05-03: 150 mg via INTRAVENOUS
  Filled 2023-05-03: qty 150

## 2023-05-03 MED ORDER — SODIUM CHLORIDE 0.9 % IV SOLN
20.0000 mg/m2 | Freq: Once | INTRAVENOUS | Status: AC
  Administered 2023-05-03: 34 mg via INTRAVENOUS
  Filled 2023-05-03: qty 34

## 2023-05-03 MED ORDER — PALONOSETRON HCL INJECTION 0.25 MG/5ML
0.2500 mg | Freq: Once | INTRAVENOUS | Status: AC
  Administered 2023-05-03: 0.25 mg via INTRAVENOUS
  Filled 2023-05-03: qty 5

## 2023-05-03 NOTE — Patient Instructions (Signed)
[place] [facility]  Discharge Instructions: Thank you for choosing [facility] to provide your oncology and hematology care.   If you have a lab appointment with the Cancer Center, please go directly to the Cancer Center and check in at the registration area.   Wear comfortable clothing and clothing appropriate for easy access to any Portacath or PICC line.   We strive to give you quality time with your provider. You may need to reschedule your appointment if you arrive late (15 or more minutes).  Arriving late affects you and other patients whose appointments are after yours.  Also, if you miss three or more appointments without notifying the office, you may be dismissed from the clinic at the provider's discretion.      For prescription refill requests, have your pharmacy contact our office and allow 72 hours for refills to be completed.    Today you received the following chemotherapy and/or immunotherapy agents: Durvalumab/Gemzar/Cisplatin   To help prevent nausea and vomiting after your treatment, we encourage you to take your nausea medication as directed.  BELOW ARE SYMPTOMS THAT SHOULD BE REPORTED IMMEDIATELY: *FEVER GREATER THAN 100.4 F (38 C) OR HIGHER *CHILLS OR SWEATING *NAUSEA AND VOMITING THAT IS NOT CONTROLLED WITH YOUR NAUSEA MEDICATION *UNUSUAL SHORTNESS OF BREATH *UNUSUAL BRUISING OR BLEEDING *URINARY PROBLEMS (pain or burning when urinating, or frequent urination) *BOWEL PROBLEMS (unusual diarrhea, constipation, pain near the anus) TENDERNESS IN MOUTH AND THROAT WITH OR WITHOUT PRESENCE OF ULCERS (sore throat, sores in mouth, or a toothache) UNUSUAL RASH, SWELLING OR PAIN  UNUSUAL VAGINAL DISCHARGE OR ITCHING   Items with * indicate a potential emergency and should be followed up as soon as possible or go to the Emergency Department if any problems should occur.  Please show the CHEMOTHERAPY ALERT CARD or IMMUNOTHERAPY  ALERT CARD at check-in to the Emergency Department and triage nurse.  Should you have questions after your visit or need to cancel or reschedule your appointment, please contact [place] [facility]  Dept: [phone]  and follow the prompts.  Office hours are 8:00 a.m. to 4:30 p.m. Monday - Friday. Please note that voicemails left after 4:00 p.m. may not be returned until the following business day.  We are closed weekends and major holidays. You have access to a nurse at all times for urgent questions. Please call the main number to the clinic Dept: [phone] and follow the prompts.   For any non-urgent questions, you may also contact your provider using MyChart. We now offer e-Visits for anyone 76 and older to request care online for non-urgent symptoms. For details visit [email or web address].   Also download the MyChart app! Go to the app store, search "MyChart", open the app, select [facility], and log in with your MyChart username and password.

## 2023-05-06 ENCOUNTER — Encounter: Payer: Self-pay | Admitting: Hematology

## 2023-05-06 NOTE — Telephone Encounter (Signed)
-----   Message from Nurse Billey Chang sent at 05/03/2023  3:29 PM EDT ----- Regarding: 1st time Durvalumab/Gemzar/Cisplaitn, Dr. Mosetta Putt patient Hello, Ms. Garduno received her first Durvalumab,Gemzar,Cisplatin infusion today. She tolerated it well. Please follow-up with her. Thank you.

## 2023-05-06 NOTE — Telephone Encounter (Signed)
Called & spoke with pt to see how she did with her recent treatment.  She reports doing well & denies any symptoms other than a little fatigue.  She reports being able to do her normal routine.  She knows how to reach [place] & knows her next appts.

## 2023-05-08 ENCOUNTER — Other Ambulatory Visit: Payer: Self-pay | Admitting: Hematology and Oncology

## 2023-05-08 ENCOUNTER — Encounter: Payer: Self-pay | Admitting: Hematology

## 2023-05-08 DIAGNOSIS — C23 Malignant neoplasm of gallbladder: Secondary | ICD-10-CM

## 2023-05-14 ENCOUNTER — Other Ambulatory Visit: Payer: Self-pay

## 2023-05-15 NOTE — Progress Notes (Addendum)
Patient Care Team: Shon Hale, MD as PCP - General (Family Medicine) Runell Gess, MD as PCP - Cardiology (Cardiology) Malachy Mood, MD as Consulting Physician (Oncology)  Clinic Day:  05/17/2023  Referring physician: Malachy Mood, MD  ASSESSMENT & PLAN:   Assessment & Plan: Gallbladder cancer (HCC) cT4N0M0, stage III -Patient had 1 episode of abdominal pain in October 2023, and was found to have a large gallbladder stone. -She underwent laparoscopic attempted cholecystectomy but was not able to be resected due to adhesions to due to them in the colon. -She developed jaundice in June 2024, status post ERCP and stent placement in CBD.  CT and MRI scan showed a liver mass adjacent to the gallbladder fossae, and a biopsy confirmed adenocarcinoma.  The immunostain was consistent with upper GI primary.  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  Unfortunately this was felt to be not resectable by Dr. Freida Busman. - CT chest from April 22, 2023 showed no evidence of metastasis. -she was started on reduced dose, first-line chemotherapy on 05/02/2023, (cisplatin, gemcitabine every 14 days and durvalumab every 28 days) --she believes she has tolerated the initial dose of chemotherapy well. She denies nausea or vomiting. States that her appetite is a little diminished. She is eating less, but more often. Trying to eat increased protein and fruits. She drinks a lot of water. 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 She does have fatigue, but it is not worse than prior to treatment.  --weight: decreased by 3 pounds  -05/16/2023 - she will have Cycle 1 day 15 tomorrow.    Plan: Labs reviewed  -CBC showing WBC 12.9; Hgb 9.6; Hct 29.3; Plt 370; Anc 10.1 -CMP - K 4.1; glucose 226; BUN 26; Creatinine 1.11; eGFR 50; Ca 9.3; AST 42; ALT 31; AlkPhos 265 She has tolerated initial cycle chemotherapy well.   Proceed with Cycle 1 day 15 05/17/2023  as scheduled  Labs/flush, follow up, and infusion as scheduled in 2 weeks   The patient understands the plans discussed today and is in agreement with them.  She knows to contact our office if she develops concerns prior to her next appointment.  I provided 30 minutes of face-to-face time during this encounter and > 50% was spent counseling as documented under my assessment and plan.    Carlean Jews, NP  [facility] [address] [place] [address] Dept: [phone] Dept Fax: [phone]   No orders of the defined types were placed in this encounter.     CHIEF COMPLAINT:  CC: gallbladder cancer   Current Treatment:  cisplatin and gemcitabine every 14 days with durvalumab every 28 days  INTERVAL HISTORY:  [name] is here today for repeat clinical assessment. She denies fevers or chills. She denies pain. Her appetite is good. Her weight has decreased 3 pounds over last 2 weeks .  Gallbladder cancer cT4N0M0, stage III -Patient had 1 episode of abdominal pain in October 2023, and was found to have a large gallbladder stone. -She underwent laparoscopic attempted cholecystectomy but was not able to be resected due to adhesions to due to them in the colon. -She developed jaundice in June 2024, status post ERCP and stent placement in CBD.  CT and MRI scan showed a liver mass adjacent to the gallbladder fossae, and a biopsy confirmed adenocarcinoma.  The immunostain was consistent with upper GI primary.  This  is likely gallbladder cancer with liver invasion, also invading to duodenum and colon.  Unfortunately this was felt to be not resectable by Dr. Freida Busman. - CT chest from April 22, 2023 showed no evidence of metastasis. -she was started on reduced dose, first-line chemotherapy on 05/02/2023, (cisplatin, gemcitabine every 14 days and durvalumab every 28 days) -good tolerance of initial dose of cisplatin, gemcitabine,  and durvalumab well. Reports only mild fatigue which has "evolved." She has some reduced appetite, but still eating well. She has pre-existing neuropathy which is unchanged since starting on chemotherapy.  Proceed with Cycle 1 day 15 cisplatin and gemcitabine on 05/17/2023 as scheduled.   I have reviewed the past medical history, past surgical history, social history and family history with the patient and they are unchanged from previous note.  ALLERGIES:  is allergic to versed [midazolam], augmentin [amoxicillin-pot clavulanate], bactrim [sulfamethoxazole-trimethoprim], demerol [meperidine], meperidine hcl, scopolamine, and tramadol.  MEDICATIONS:  Current Outpatient Medications  Medication Sig Dispense Refill   aspirin EC 81 MG tablet Take 81 mg by mouth at bedtime. Swallow whole.     atorvastatin (LIPITOR) 80 MG tablet TAKE 1 TABLET BY MOUTH ONCE DAILY. MUST  KEEP  UPCOMING  APPOINTMENT  FOR  FUTURE  REFILLS 90 tablet 0   brimonidine (ALPHAGAN) 0.2 % ophthalmic solution Place 1 drop into the right eye 2 (two) times daily.     hydrochlorothiazide (HYDRODIURIL) 12.5 MG tablet Take 1 tablet by mouth once daily 90 tablet 0   Insulin Lispro Prot & Lispro (HUMALOG MIX 75/25 KWIKPEN) (75-25) 100 UNIT/ML Kwikpen INJECT 15 UNITS SUBCUTANEOUSLY ONCE DAILY WITH BREAKFAST (Patient taking differently: Inject 4-8 Units into the skin See admin instructions. Inject 8 units into the skin every morning and 4 units every evening.) 15 mL 0   Insulin Pen Needle (ULTRA-THIN II MINI PEN NEEDLE) 31G X 5 MM MISC USE AS DIRECTED 50 each 2   JARDIANCE 25 MG TABS tablet TAKE 1 TABLET BY MOUTH ONCE DAILY BEFORE BREAKFAST 90 tablet 3   Lancets (ONETOUCH DELICA PLUS LANCET33G) MISC Use to check blood sugars twice daily. (Patient taking differently: Use to check blood sugars  daily.) 100 each 5   lidocaine-prilocaine (EMLA) cream Apply to affected area once 30 g 3   metFORMIN (GLUCOPHAGE) 1000 MG tablet Take 1 tablet by mouth  twice daily 180 tablet 3   metoprolol tartrate (LOPRESSOR) 50 MG tablet Take 1 tablet (50 mg total) by mouth 2 (two) times daily. 180 tablet 0   Multiple Vitamins-Minerals (MULTI FOR HER PO) Take by mouth.     mupirocin ointment (BACTROBAN) 2 % Apply 1 Application topically 2 (two) times daily. (Patient taking differently: Apply 1 Application topically 2 (two) times daily. As needed) 30 g 2   ondansetron (ZOFRAN) 8 MG tablet Take 1 tablet (8 mg total) by mouth every 8 (eight) hours as needed for nausea or vomiting. Start on the third day after cisplatin. 30 tablet 1   ONETOUCH VERIO test strip USE TO CHECK BLOOD SUGARS ONCE DAILY 100 each 2   pantoprazole (PROTONIX) 40 MG tablet Take 1 tablet by mouth once daily 90 tablet 3   prochlorperazine (COMPAZINE) 10 MG tablet Take 1 tablet (10 mg total) by mouth every 6 (six) hours as needed (Nausea or vomiting). 30 tablet 1   Propylene Glycol (SYSTANE BALANCE) 0.6 % SOLN Place 1 drop into the left eye 3 (three) times daily as needed (dry/irritated eyes).     repaglinide (PRANDIN) 2 MG tablet Take  1 tab by breakfast and 1 tab before dinner (Patient taking differently: Take 2 mg by mouth daily.) 180 tablet 2   telmisartan (MICARDIS) 80 MG tablet Take 1 tablet by mouth once daily 90 tablet 3   XARELTO 2.5 MG TABS tablet Take 1 tablet by mouth twice daily 60 tablet 0   No current facility-administered medications for this visit.    HISTORY OF PRESENT ILLNESS:   Oncology History  Gallbladder cancer (HCC)  04/04/2023 Imaging    IMPRESSION: 1. Ill-defined hypodensity in the region of the gallbladder fossa appears similar to the prior study given lack of contrast. This would be better evaluated with dedicated MRI. 2. Nonobstructing right renal calculus. 3. Large amount of stool throughout the colon. 4. Stable indeterminate lucent areas in the vertebral bodies of the lumbar spine. 5. Severe atherosclerotic disease.     04/09/2023 Pathology Results      FINAL MICROSCOPIC DIAGNOSIS:   A. LIVER, ADJACENT TO GALLBLADDER FOSSA, BIOPSY:  - Moderately differentiated adenocarcinoma    04/09/2023 Cancer Staging   Staging form: Distal Bile Duct, AJCC 8th Edition - Clinical stage from 04/09/2023: Stage IIIB (cT4, cN0, cM0) - Signed by Malachy Mood, MD on 04/18/2023 Total positive nodes: 0 Histologic grade (G): G2 Histologic grading system: 3 grade system   04/18/2023 Initial Diagnosis   Cholangiocarcinoma of liver (HCC)   05/03/2023 -  Chemotherapy   Patient is on Treatment Plan : BILIARY TRACT Cisplatin + Gemcitabine D1,15 + Durvalumab (1500) D1 q28d / Durvalumab (1500) q28d         REVIEW OF SYSTEMS:   Constitutional: Denies fevers, chills or abnormal weight loss. Has some decreased appetite, but eating ok Eyes: Denies blurriness of vision Ears, nose, mouth, throat, and face: Denies mucositis or sore throat Respiratory: Denies cough, dyspnea or wheezes Cardiovascular: Denies palpitation, chest discomfort or lower extremity swelling Gastrointestinal:  Denies nausea, heartburn or change in bowel habits. Had single episode constipation earlier this week. This was relieved with one dose miralax.  Skin: Denies abnormal skin rashes Lymphatics: Denies new lymphadenopathy or easy bruising Neurological:Denies numbness, tingling or new weaknesses Behavioral/Psych: Mood is stable, no new changes  All other systems were reviewed with the patient and are negative.   VITALS:   Today's Vitals   05/16/23 0927 05/16/23 0930  BP: (!) 120/50   Pulse: 89   Resp: 17   Temp: 98.1 F (36.7 C)   TempSrc: Oral   SpO2: 100%   Weight: 134 lb 8 oz (61 kg)   PainSc:  0-No pain   Body mass index is 22.38 kg/m.   Wt Readings from Last 3 Encounters:  05/16/23 134 lb 8 oz (61 kg)  05/03/23 137 lb 2 oz (62.2 kg)  05/02/23 137 lb (62.1 kg)    Body mass index is 22.38 kg/m.  Performance status (ECOG): 1 - Symptomatic but completely  ambulatory  PHYSICAL EXAM:   GENERAL:alert, no distress and comfortable SKIN: skin color, texture, turgor are normal, no rashes or significant lesions EYES: normal, Conjunctiva are pink and non-injected, sclera clear OROPHARYNX:no exudate, no erythema and lips, buccal mucosa, and tongue normal  NECK: supple, thyroid normal size, non-tender, without nodularity LYMPH:  no palpable lymphadenopathy in the cervical, axillary or inguinal LUNGS: clear to auscultation and percussion with normal breathing effort HEART: regular rate & rhythm and no murmurs and no lower extremity edema ABDOMEN:abdomen soft, non-tender and normal bowel sounds Musculoskeletal:no cyanosis of digits and no clubbing  NEURO: alert & oriented x  3 with fluent speech, no focal motor/sensory deficits  LABORATORY DATA:  I have reviewed the data as listed    Component Value Date/Time   NA 135 05/16/2023 0845   NA 142 07/29/2020 0942   K 4.1 05/16/2023 0845   CL 98 05/16/2023 0845   CO2 24 05/16/2023 0845   GLUCOSE 226 (H) 05/16/2023 0845   BUN 26 (H) 05/16/2023 0845   BUN 17 07/29/2020 0942   CREATININE 1.11 (H) 05/16/2023 0845   CREATININE 0.96 10/12/2021 1644   CALCIUM 9.3 05/16/2023 0845   PROT 6.9 05/16/2023 0845   PROT 6.3 07/28/2020 0945   ALBUMIN 3.0 (L) 05/16/2023 0845   ALBUMIN 4.2 07/28/2020 0945   AST 42 (H) 05/16/2023 0845   ALT 31 05/16/2023 0845   ALKPHOS 265 (H) 05/16/2023 0845   BILITOT 0.9 05/16/2023 0845   GFRNONAA 50 (L) 05/16/2023 0845   GFRAA 75 07/29/2020 0942     Lab Results  Component Value Date   WBC 12.9 (H) 05/16/2023   NEUTROABS 10.1 (H) 05/16/2023   HGB 9.6 (L) 05/16/2023   HCT 29.3 (L) 05/16/2023   MCV 89.9 05/16/2023   PLT 370 05/16/2023     RADIOGRAPHIC STUDIES:  DG CHEST PORT 1 VIEW  Result Date: 04/29/2023 CLINICAL DATA:  Port placement EXAM: PORTABLE CHEST 1 VIEW COMPARISON:  06/28/2022, 04/22/2023 FINDINGS: Interval placement of left subclavian approach chest  port with distal tip terminating at the level of the distal SVC. Normal heart size status post sternotomy and CABG. No focal airspace consolidation, pleural effusion, or pneumothorax. IMPRESSION: Interval placement of left subclavian approach chest port with distal tip terminating at the level of the distal SVC. No pneumothorax. Electronically Signed   By: Duanne Guess D.O.   On: 04/29/2023 15:15   DG C-Arm 1-60 Min-No Report  Result Date: 04/29/2023 Fluoroscopy was utilized by the requesting physician.  No radiographic interpretation.   CT Chest Wo Contrast  Result Date: 04/23/2023 CLINICAL DATA:  New diagnosis of cholangiocarcinoma. Staging. * Tracking Code: BO * EXAM: CT CHEST WITHOUT CONTRAST TECHNIQUE: Multidetector CT imaging of the chest was performed following the standard protocol without IV contrast. RADIATION DOSE REDUCTION: This exam was performed according to the departmental dose-optimization program which includes automated exposure control, adjustment of the mA and/or kV according to patient size and/or use of iterative reconstruction technique. COMPARISON:  None Available. FINDINGS: Cardiovascular: Previous median sternotomy and CABG procedure. Aortic atherosclerosis. No pericardial effusion. Heart size is normal. Mediastinum/Nodes: No enlarged mediastinal or axillary lymph nodes. Thyroid gland, trachea, and esophagus demonstrate no significant findings. Lungs/Pleura: No pleural effusion, airspace consolidation, atelectasis, or pneumothorax. There are a few small scattered lung nodules which have a nonspecific appearance. Most of these measure less than 5 mm. The largest nodule is in the subpleural aspect of the posterior left lower lobe measuring 4 mm, image 101/8. Most of these nodules were present on remote exam from 02/13/2012 and are favored to represent a benign postinflammatory/infectious process. No new, highly suspicious lung nodules identified bilaterally. Upper Abdomen: Signs  recent common bile duct stent placement with decompression of the dilated intrahepatic bile ducts. Ill-defined low-density lesion within segment 5 is again noted, image 149/2. This area is only partially visualized. Refer to report from 04/05/2023 MRI for further details. Musculoskeletal: No acute or suspicious bone lesions. IMPRESSION: 1. No evidence of metastatic disease within the chest. 2. Small scattered lung nodules which have a nonspecific appearance. Most of these measure less than 5 mm and  are favored to represent a benign postinflammatory/infectious process. A similar appearance was found on remote CT of the chest from. 02/13/2012 no new, highly suspicious lung nodules identified bilaterally. 3. Signs recent common bile duct stent placement with decompression of the dilated intrahepatic bile ducts. 4. Ill-defined low-density lesion within segment 5 is again noted. This area is only partially visualized. Refer to MRI report from 04/05/2023. 5. Aortic Atherosclerosis (ICD10-I70.0). Electronically Signed   By: Signa Kell M.D.   On: 04/23/2023 12:50     Addendum I have seen the patient, examined her. I agree with the assessment and and plan and have edited the notes.   [name] tolerated first cycle chemo well overall, with some fatigue.  She is scheduled for cycle 2 today.  She had many questions about the palliative goal of her chemotherapy.  I explained that chemotherapy would not cure her cancer, and unfortunately her cancer is not resectable.  I discussed option of consolidation radiation if she responds well to chemo after 3 to 6 months of treatment.  I also discussed maintenance therapy with durvalumab along after 3 to 6 months chemo, her response and tolerance.  All the questions were answered, I spent a total of 30 minutes for her visit today, more than 50% time on face-to-face counseling.  Malachy Mood MD 05/17/2023

## 2023-05-15 NOTE — Assessment & Plan Note (Signed)
cT4N0M0, stage III -Patient had 1 episode of abdominal pain in October 2023, and was found to have a large gallbladder stone. -She underwent laparoscopic attempted cholecystectomy but was not able to be resected due to adhesions to due to them in the colon. -She developed jaundice in June 2024, status post ERCP and stent placement in [place].  CT and MRI scan showed a liver mass adjacent to the gallbladder fossae, and a biopsy confirmed adenocarcinoma.  The immunostain was consistent with upper GI primary.  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  Unfortunately this was felt to be not resectable by Dr. Freida Busman. - CT chest from April 22, 2023 showed no evidence of metastasis. -she was started on reduced dose, first-line chemotherapy on 05/02/2023, (cisplatin, gemcitabine every 14 days and durvalumab every 28 days) --she believes she has tolerated the initial dose of chemotherapy well. She denies nausea or vomiting. States that her appetite is a little diminished. She is eating less, but more often. Trying to eat increased protein and fruits. She drinks a lot of water. 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 She does have fatigue, but it is not worse than prior to treatment.  --weight: decreased by 3 pounds  -05/16/2023 - she will have Cycle 1 day 15 tomorrow.

## 2023-05-16 ENCOUNTER — Inpatient Hospital Stay (HOSPITAL_BASED_OUTPATIENT_CLINIC_OR_DEPARTMENT_OTHER): Payer: Medicare Other | Admitting: Nurse Practitioner

## 2023-05-16 ENCOUNTER — Other Ambulatory Visit: Payer: Medicare Other

## 2023-05-16 ENCOUNTER — Inpatient Hospital Stay: Payer: Medicare Other

## 2023-05-16 VITALS — BP 120/50 | HR 89 | Temp 98.1°F | Resp 17 | Wt 134.5 lb

## 2023-05-16 DIAGNOSIS — Z5112 Encounter for antineoplastic immunotherapy: Secondary | ICD-10-CM | POA: Diagnosis not present

## 2023-05-16 DIAGNOSIS — C221 Intrahepatic bile duct carcinoma: Secondary | ICD-10-CM | POA: Diagnosis not present

## 2023-05-16 DIAGNOSIS — E119 Type 2 diabetes mellitus without complications: Secondary | ICD-10-CM | POA: Diagnosis not present

## 2023-05-16 DIAGNOSIS — I10 Essential (primary) hypertension: Secondary | ICD-10-CM | POA: Diagnosis not present

## 2023-05-16 DIAGNOSIS — Z5111 Encounter for antineoplastic chemotherapy: Secondary | ICD-10-CM | POA: Diagnosis not present

## 2023-05-16 DIAGNOSIS — Z95828 Presence of other vascular implants and grafts: Secondary | ICD-10-CM

## 2023-05-16 DIAGNOSIS — C23 Malignant neoplasm of gallbladder: Secondary | ICD-10-CM

## 2023-05-16 LAB — CBC WITH DIFFERENTIAL (CANCER CENTER ONLY)
Abs Immature Granulocytes: 0.03 10*3/uL (ref 0.00–0.07)
Basophils Absolute: 0 10*3/uL (ref 0.0–0.1)
Basophils Relative: 0 %
Eosinophils Absolute: 0.1 10*3/uL (ref 0.0–0.5)
Eosinophils Relative: 1 %
HCT: 29.3 % — ABNORMAL LOW (ref 36.0–46.0)
Hemoglobin: 9.6 g/dL — ABNORMAL LOW (ref 12.0–15.0)
Immature Granulocytes: 0 %
Lymphocytes Relative: 10 %
Lymphs Abs: 1.2 10*3/uL (ref 0.7–4.0)
MCH: 29.4 pg (ref 26.0–34.0)
MCHC: 32.8 g/dL (ref 30.0–36.0)
MCV: 89.9 fL (ref 80.0–100.0)
Monocytes Absolute: 1.4 10*3/uL — ABNORMAL HIGH (ref 0.1–1.0)
Monocytes Relative: 11 %
Neutro Abs: 10.1 10*3/uL — ABNORMAL HIGH (ref 1.7–7.7)
Neutrophils Relative %: 78 %
Platelet Count: 370 10*3/uL (ref 150–400)
RBC: 3.26 MIL/uL — ABNORMAL LOW (ref 3.87–5.11)
RDW: 14.6 % (ref 11.5–15.5)
WBC Count: 12.9 10*3/uL — ABNORMAL HIGH (ref 4.0–10.5)
nRBC: 0.2 % (ref 0.0–0.2)

## 2023-05-16 LAB — CMP (CANCER CENTER ONLY)
ALT: 31 U/L (ref 0–44)
AST: 42 U/L — ABNORMAL HIGH (ref 15–41)
Albumin: 3 g/dL — ABNORMAL LOW (ref 3.5–5.0)
Alkaline Phosphatase: 265 U/L — ABNORMAL HIGH (ref 38–126)
Anion gap: 13 (ref 5–15)
BUN: 26 mg/dL — ABNORMAL HIGH (ref 8–23)
CO2: 24 mmol/L (ref 22–32)
Calcium: 9.3 mg/dL (ref 8.9–10.3)
Chloride: 98 mmol/L (ref 98–111)
Creatinine: 1.11 mg/dL — ABNORMAL HIGH (ref 0.44–1.00)
GFR, Estimated: 50 mL/min — ABNORMAL LOW (ref >60)
Glucose, Bld: 226 mg/dL — ABNORMAL HIGH (ref 70–99)
Potassium: 4.1 mmol/L (ref 3.5–5.1)
Sodium: 135 mmol/L (ref 135–145)
Total Bilirubin: 0.9 mg/dL (ref 0.3–1.2)
Total Protein: 6.9 g/dL (ref 6.5–8.1)

## 2023-05-16 LAB — MAGNESIUM: Magnesium: 1.5 mg/dL — ABNORMAL LOW (ref 1.7–2.4)

## 2023-05-16 LAB — TSH: TSH: 1.289 u[IU]/mL (ref 0.350–4.500)

## 2023-05-16 MED ORDER — SODIUM CHLORIDE 0.9% FLUSH
10.0000 mL | Freq: Once | INTRAVENOUS | Status: AC
  Administered 2023-05-16: 10 mL

## 2023-05-16 MED ORDER — HEPARIN SOD (PORK) LOCK FLUSH 100 UNIT/ML IV SOLN
500.0000 [IU] | Freq: Once | INTRAVENOUS | Status: AC
  Administered 2023-05-16: 500 [IU]

## 2023-05-16 MED FILL — Fosaprepitant Dimeglumine For IV Infusion 150 MG (Base Eq): INTRAVENOUS | Qty: 5 | Status: AC

## 2023-05-17 ENCOUNTER — Inpatient Hospital Stay: Payer: Medicare Other

## 2023-05-17 VITALS — BP 128/62 | HR 82 | Temp 98.0°F | Resp 18

## 2023-05-17 DIAGNOSIS — C23 Malignant neoplasm of gallbladder: Secondary | ICD-10-CM | POA: Diagnosis not present

## 2023-05-17 DIAGNOSIS — I10 Essential (primary) hypertension: Secondary | ICD-10-CM | POA: Diagnosis not present

## 2023-05-17 DIAGNOSIS — C221 Intrahepatic bile duct carcinoma: Secondary | ICD-10-CM | POA: Diagnosis not present

## 2023-05-17 DIAGNOSIS — Z5112 Encounter for antineoplastic immunotherapy: Secondary | ICD-10-CM | POA: Diagnosis not present

## 2023-05-17 DIAGNOSIS — Z5111 Encounter for antineoplastic chemotherapy: Secondary | ICD-10-CM | POA: Diagnosis not present

## 2023-05-17 DIAGNOSIS — E119 Type 2 diabetes mellitus without complications: Secondary | ICD-10-CM | POA: Diagnosis not present

## 2023-05-17 LAB — T4: T4, Total: 11.9 ug/dL (ref 4.5–12.0)

## 2023-05-17 MED ORDER — MAGNESIUM SULFATE 2 GM/50ML IV SOLN
2.0000 g | Freq: Once | INTRAVENOUS | Status: AC
  Administered 2023-05-17: 2 g via INTRAVENOUS
  Filled 2023-05-17: qty 50

## 2023-05-17 MED ORDER — SODIUM CHLORIDE 0.9 % IV SOLN
150.0000 mg | Freq: Once | INTRAVENOUS | Status: AC
  Administered 2023-05-17: 150 mg via INTRAVENOUS
  Filled 2023-05-17: qty 150

## 2023-05-17 MED ORDER — POTASSIUM CHLORIDE IN NACL 20-0.9 MEQ/L-% IV SOLN
Freq: Once | INTRAVENOUS | Status: AC
  Filled 2023-05-17: qty 1000

## 2023-05-17 MED ORDER — SODIUM CHLORIDE 0.9 % IV SOLN
800.0000 mg/m2 | Freq: Once | INTRAVENOUS | Status: AC
  Administered 2023-05-17: 1369 mg via INTRAVENOUS
  Filled 2023-05-17: qty 36.01

## 2023-05-17 MED ORDER — SODIUM CHLORIDE 0.9 % IV SOLN: Freq: Once | INTRAVENOUS | Status: AC

## 2023-05-17 MED ORDER — PALONOSETRON HCL INJECTION 0.25 MG/5ML
0.2500 mg | Freq: Once | INTRAVENOUS | Status: AC
  Administered 2023-05-17: 0.25 mg via INTRAVENOUS
  Filled 2023-05-17: qty 5

## 2023-05-17 MED ORDER — SODIUM CHLORIDE 0.9 % IV SOLN
20.0000 mg/m2 | Freq: Once | INTRAVENOUS | Status: AC
  Administered 2023-05-17: 34 mg via INTRAVENOUS
  Filled 2023-05-17: qty 34

## 2023-05-17 NOTE — Patient Instructions (Signed)
[facility]  Discharge Instructions: Thank you for choosing [facility] to provide your oncology and hematology care.   If you have a lab appointment with the Cancer Center, please go directly to the Cancer Center and check in at the registration area.   Wear comfortable clothing and clothing appropriate for easy access to any Portacath or PICC line.   We strive to give you quality time with your provider. You may need to reschedule your appointment if you arrive late (15 or more minutes).  Arriving late affects you and other patients whose appointments are after yours.  Also, if you miss three or more appointments without notifying the office, you may be dismissed from the clinic at the provider's discretion.      For prescription refill requests, have your pharmacy contact our office and allow 72 hours for refills to be completed.    Today you received the following chemotherapy and/or immunotherapy agents: Gemzar & Cisplatin       To help prevent nausea and vomiting after your treatment, we encourage you to take your nausea medication as directed.  BELOW ARE SYMPTOMS THAT SHOULD BE REPORTED IMMEDIATELY: *FEVER GREATER THAN 100.4 F (38 C) OR HIGHER *CHILLS OR SWEATING *NAUSEA AND VOMITING THAT IS NOT CONTROLLED WITH YOUR NAUSEA MEDICATION *UNUSUAL SHORTNESS OF BREATH *UNUSUAL BRUISING OR BLEEDING *URINARY PROBLEMS (pain or burning when urinating, or frequent urination) *BOWEL PROBLEMS (unusual diarrhea, constipation, pain near the anus) TENDERNESS IN MOUTH AND THROAT WITH OR WITHOUT PRESENCE OF ULCERS (sore throat, sores in mouth, or a toothache) UNUSUAL RASH, SWELLING OR PAIN  UNUSUAL VAGINAL DISCHARGE OR ITCHING   Items with * indicate a potential emergency and should be followed up as soon as possible or go to the Emergency Department if any problems should occur.  Please show the CHEMOTHERAPY ALERT CARD or IMMUNOTHERAPY ALERT  CARD at check-in to the Emergency Department and triage nurse.  Should you have questions after your visit or need to cancel or reschedule your appointment, please contact [facility]  Dept: [phone]  and follow the prompts.  Office hours are 8:00 a.m. to 4:30 p.m. Monday - Friday. Please note that voicemails left after 4:00 p.m. may not be returned until the following business day.  We are closed weekends and major holidays. You have access to a nurse at all times for urgent questions. Please call the main number to the clinic Dept: [phone] and follow the prompts.   For any non-urgent questions, you may also contact your provider using MyChart. We now offer e-Visits for anyone 18 and older to request care online for non-urgent symptoms. For details visit [email or web address].   Also download the MyChart app! Go to the app store, search "MyChart", open the app, select [facility], and log in with your MyChart username and password.   

## 2023-05-20 ENCOUNTER — Other Ambulatory Visit: Payer: Self-pay | Admitting: Vascular Surgery

## 2023-05-20 ENCOUNTER — Other Ambulatory Visit: Payer: Self-pay | Admitting: Cardiovascular Disease

## 2023-05-22 ENCOUNTER — Other Ambulatory Visit: Payer: Self-pay

## 2023-05-22 MED ORDER — METOPROLOL TARTRATE 50 MG PO TABS: 50.0000 mg | ORAL_TABLET | Freq: Two times a day (BID) | ORAL | 0 refills | Status: DC

## 2023-05-22 NOTE — Telephone Encounter (Signed)
Pt's medication was sent to pt's pharmacy as requested. Confirmation received.  °

## 2023-05-29 MED FILL — Fosaprepitant Dimeglumine For IV Infusion 150 MG (Base Eq): INTRAVENOUS | Qty: 5 | Status: AC

## 2023-05-30 ENCOUNTER — Inpatient Hospital Stay: Payer: Medicare Other

## 2023-05-30 ENCOUNTER — Other Ambulatory Visit: Payer: Medicare Other

## 2023-05-30 ENCOUNTER — Inpatient Hospital Stay: Payer: Medicare Other | Attending: Hematology

## 2023-05-30 ENCOUNTER — Encounter: Payer: Self-pay | Admitting: Hematology

## 2023-05-30 ENCOUNTER — Inpatient Hospital Stay (HOSPITAL_BASED_OUTPATIENT_CLINIC_OR_DEPARTMENT_OTHER): Payer: Medicare Other | Admitting: Hematology

## 2023-05-30 VITALS — BP 135/48 | HR 95 | Temp 97.8°F | Resp 17 | Ht 65.0 in | Wt 134.0 lb

## 2023-05-30 DIAGNOSIS — E1142 Type 2 diabetes mellitus with diabetic polyneuropathy: Secondary | ICD-10-CM | POA: Diagnosis not present

## 2023-05-30 DIAGNOSIS — Z5112 Encounter for antineoplastic immunotherapy: Secondary | ICD-10-CM | POA: Insufficient documentation

## 2023-05-30 DIAGNOSIS — Z7901 Long term (current) use of anticoagulants: Secondary | ICD-10-CM | POA: Insufficient documentation

## 2023-05-30 DIAGNOSIS — C23 Malignant neoplasm of gallbladder: Secondary | ICD-10-CM | POA: Diagnosis not present

## 2023-05-30 DIAGNOSIS — I10 Essential (primary) hypertension: Secondary | ICD-10-CM | POA: Diagnosis not present

## 2023-05-30 DIAGNOSIS — Z7982 Long term (current) use of aspirin: Secondary | ICD-10-CM | POA: Diagnosis not present

## 2023-05-30 DIAGNOSIS — Z5111 Encounter for antineoplastic chemotherapy: Secondary | ICD-10-CM | POA: Insufficient documentation

## 2023-05-30 DIAGNOSIS — Z7984 Long term (current) use of oral hypoglycemic drugs: Secondary | ICD-10-CM | POA: Diagnosis not present

## 2023-05-30 DIAGNOSIS — Z794 Long term (current) use of insulin: Secondary | ICD-10-CM | POA: Insufficient documentation

## 2023-05-30 DIAGNOSIS — E119 Type 2 diabetes mellitus without complications: Secondary | ICD-10-CM | POA: Diagnosis not present

## 2023-05-30 DIAGNOSIS — Z95828 Presence of other vascular implants and grafts: Secondary | ICD-10-CM

## 2023-05-30 DIAGNOSIS — Z79899 Other long term (current) drug therapy: Secondary | ICD-10-CM | POA: Diagnosis not present

## 2023-05-30 LAB — CBC WITH DIFFERENTIAL (CANCER CENTER ONLY)
Abs Immature Granulocytes: 0.1 10*3/uL — ABNORMAL HIGH (ref 0.00–0.07)
Basophils Absolute: 0.1 10*3/uL (ref 0.0–0.1)
Basophils Relative: 1 %
Eosinophils Absolute: 0.1 10*3/uL (ref 0.0–0.5)
Eosinophils Relative: 1 %
HCT: 28.3 % — ABNORMAL LOW (ref 36.0–46.0)
Hemoglobin: 9.2 g/dL — ABNORMAL LOW (ref 12.0–15.0)
Immature Granulocytes: 1 %
Lymphocytes Relative: 11 %
Lymphs Abs: 1.6 10*3/uL (ref 0.7–4.0)
MCH: 29.4 pg (ref 26.0–34.0)
MCHC: 32.5 g/dL (ref 30.0–36.0)
MCV: 90.4 fL (ref 80.0–100.0)
Monocytes Absolute: 1.3 10*3/uL — ABNORMAL HIGH (ref 0.1–1.0)
Monocytes Relative: 9 %
Neutro Abs: 10.8 10*3/uL — ABNORMAL HIGH (ref 1.7–7.7)
Neutrophils Relative %: 77 %
Platelet Count: 390 10*3/uL (ref 150–400)
RBC: 3.13 MIL/uL — ABNORMAL LOW (ref 3.87–5.11)
RDW: 16.1 % — ABNORMAL HIGH (ref 11.5–15.5)
WBC Count: 13.9 10*3/uL — ABNORMAL HIGH (ref 4.0–10.5)
nRBC: 0 % (ref 0.0–0.2)

## 2023-05-30 LAB — CMP (CANCER CENTER ONLY)
ALT: 54 U/L — ABNORMAL HIGH (ref 0–44)
AST: 68 U/L — ABNORMAL HIGH (ref 15–41)
Albumin: 3.4 g/dL — ABNORMAL LOW (ref 3.5–5.0)
Alkaline Phosphatase: 281 U/L — ABNORMAL HIGH (ref 38–126)
Anion gap: 11 (ref 5–15)
BUN: 25 mg/dL — ABNORMAL HIGH (ref 8–23)
CO2: 27 mmol/L (ref 22–32)
Calcium: 9.6 mg/dL (ref 8.9–10.3)
Chloride: 102 mmol/L (ref 98–111)
Creatinine: 1.08 mg/dL — ABNORMAL HIGH (ref 0.44–1.00)
GFR, Estimated: 52 mL/min — ABNORMAL LOW (ref >60)
Glucose, Bld: 180 mg/dL — ABNORMAL HIGH (ref 70–99)
Potassium: 3.9 mmol/L (ref 3.5–5.1)
Sodium: 140 mmol/L (ref 135–145)
Total Bilirubin: 0.8 mg/dL (ref 0.3–1.2)
Total Protein: 6.5 g/dL (ref 6.5–8.1)

## 2023-05-30 LAB — MAGNESIUM: Magnesium: 1.4 mg/dL — ABNORMAL LOW (ref 1.7–2.4)

## 2023-05-30 MED ORDER — SODIUM CHLORIDE 0.9 % IV SOLN: Freq: Once | INTRAVENOUS | Status: AC

## 2023-05-30 MED ORDER — SODIUM CHLORIDE 0.9% FLUSH
10.0000 mL | INTRAVENOUS | Status: DC | PRN
  Administered 2023-05-30: 10 mL

## 2023-05-30 MED ORDER — MAGNESIUM SULFATE 4 GM/100ML IV SOLN
4.0000 g | Freq: Once | INTRAVENOUS | Status: DC
  Filled 2023-05-30: qty 100

## 2023-05-30 MED ORDER — HEPARIN SOD (PORK) LOCK FLUSH 100 UNIT/ML IV SOLN
500.0000 [IU] | Freq: Once | INTRAVENOUS | Status: AC | PRN
  Administered 2023-05-30: 500 [IU]

## 2023-05-30 MED ORDER — PALONOSETRON HCL INJECTION 0.25 MG/5ML
0.2500 mg | Freq: Once | INTRAVENOUS | Status: AC
  Administered 2023-05-30: 0.25 mg via INTRAVENOUS
  Filled 2023-05-30: qty 5

## 2023-05-30 MED ORDER — SODIUM CHLORIDE 0.9 % IV SOLN
1500.0000 mg | Freq: Once | INTRAVENOUS | Status: AC
  Administered 2023-05-30: 1500 mg via INTRAVENOUS
  Filled 2023-05-30: qty 30

## 2023-05-30 MED ORDER — SODIUM CHLORIDE 0.9 % IV SOLN
800.0000 mg/m2 | Freq: Once | INTRAVENOUS | Status: AC
  Administered 2023-05-30: 1369 mg via INTRAVENOUS
  Filled 2023-05-30: qty 36.01

## 2023-05-30 MED ORDER — MAGNESIUM SULFATE 2 GM/50ML IV SOLN
2.0000 g | Freq: Once | INTRAVENOUS | Status: AC
  Administered 2023-05-30: 2 g via INTRAVENOUS
  Filled 2023-05-30: qty 50

## 2023-05-30 MED ORDER — SODIUM CHLORIDE 0.9 % IV SOLN
20.0000 mg/m2 | Freq: Once | INTRAVENOUS | Status: AC
  Administered 2023-05-30: 34 mg via INTRAVENOUS
  Filled 2023-05-30: qty 34

## 2023-05-30 MED ORDER — POTASSIUM CHLORIDE IN NACL 20-0.9 MEQ/L-% IV SOLN
Freq: Once | INTRAVENOUS | Status: AC
  Filled 2023-05-30: qty 1000

## 2023-05-30 MED ORDER — SODIUM CHLORIDE 0.9% FLUSH
10.0000 mL | Freq: Once | INTRAVENOUS | Status: AC
  Administered 2023-05-30: 10 mL

## 2023-05-30 MED ORDER — SODIUM CHLORIDE 0.9 % IV SOLN
150.0000 mg | Freq: Once | INTRAVENOUS | Status: AC
  Administered 2023-05-30: 150 mg via INTRAVENOUS
  Filled 2023-05-30: qty 150

## 2023-05-30 MED ORDER — MAGNESIUM OXIDE -MG SUPPLEMENT 400 (240 MG) MG PO TABS: 400.0000 mg | ORAL_TABLET | Freq: Two times a day (BID) | ORAL | 1 refills | Status: DC

## 2023-05-30 NOTE — Assessment & Plan Note (Signed)
cT4N0M0, stage III -Patient had 1 episode of abdominal pain in October 2023, and was found to have a large gallbladder stone. -She underwent laparoscopic attempted cholecystectomy but was not able to be resected due to adhesions to due to them in the colon. -She developed jaundice in June 2024, status post ERCP and stent placement in CBD.  CT and MRI scan showed a liver mass adjacent to the gallbladder fossae, and a biopsy confirmed adenocarcinoma.  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  Unfortunately this was felt to be not resectable by Dr. Freida Busman. -I reviewed her CT chest from April 22, 2023, which showed no evidence of metastasis. -I recommended first-line cisplatin, gemcitabine  every 14 days and durvalumab every 28 days.  If she responded well, will stop chemo after 4 to 6 months of treatment, and changed to maintenance therapy. She started on 05/03/23 every 2 weeks and has been tolerating well

## 2023-05-30 NOTE — Assessment & Plan Note (Signed)
-  Follow-up with PCP  -Continue medication, -Will monitor her blood glucose and blood pressure closely during her chemotherapy

## 2023-05-30 NOTE — Assessment & Plan Note (Signed)
-  Follow-up with PCP and other specialists -Continue medication, -Will monitor her blood glucose and blood pressure closely during her chemotherapy

## 2023-05-30 NOTE — Progress Notes (Signed)
Patient seen by Dr. [name] are within treatment parameters.  Labs reviewed:  mg 1.4 ok to treat  Per physician team, patient is ready for treatment and there are NO modifications to the treatment plan.

## 2023-05-30 NOTE — Patient Instructions (Signed)
[facility]  Discharge Instructions: Thank you for choosing [facility] to provide your oncology and hematology care.   If you have a lab appointment with the Cancer Center, please go directly to the Cancer Center and check in at the registration area.   Wear comfortable clothing and clothing appropriate for easy access to any Portacath or PICC line.   We strive to give you quality time with your provider. You may need to reschedule your appointment if you arrive late (15 or more minutes).  Arriving late affects you and other patients whose appointments are after yours.  Also, if you miss three or more appointments without notifying the office, you may be dismissed from the clinic at the provider's discretion.      For prescription refill requests, have your pharmacy contact our office and allow 72 hours for refills to be completed.    Today you received the following chemotherapy and/or immunotherapy agents: Imfinzi, Gemzar, Platinol      To help prevent nausea and vomiting after your treatment, we encourage you to take your nausea medication as directed.  BELOW ARE SYMPTOMS THAT SHOULD BE REPORTED IMMEDIATELY: *FEVER GREATER THAN 100.4 F (38 C) OR HIGHER *CHILLS OR SWEATING *NAUSEA AND VOMITING THAT IS NOT CONTROLLED WITH YOUR NAUSEA MEDICATION *UNUSUAL SHORTNESS OF BREATH *UNUSUAL BRUISING OR BLEEDING *URINARY PROBLEMS (pain or burning when urinating, or frequent urination) *BOWEL PROBLEMS (unusual diarrhea, constipation, pain near the anus) TENDERNESS IN MOUTH AND THROAT WITH OR WITHOUT PRESENCE OF ULCERS (sore throat, sores in mouth, or a toothache) UNUSUAL RASH, SWELLING OR PAIN  UNUSUAL VAGINAL DISCHARGE OR ITCHING   Items with * indicate a potential emergency and should be followed up as soon as possible or go to the Emergency Department if any problems should occur.  Please show the CHEMOTHERAPY ALERT CARD or IMMUNOTHERAPY  ALERT CARD at check-in to the Emergency Department and triage nurse.  Should you have questions after your visit or need to cancel or reschedule your appointment, please contact [facility]  Dept: [phone]  and follow the prompts.  Office hours are 8:00 a.m. to 4:30 p.m. Monday - Friday. Please note that voicemails left after 4:00 p.m. may not be returned until the following business day.  We are closed weekends and major holidays. You have access to a nurse at all times for urgent questions. Please call the main number to the clinic Dept: [phone] and follow the prompts.   For any non-urgent questions, you may also contact your provider using MyChart. We now offer e-Visits for anyone 47 and older to request care online for non-urgent symptoms. For details visit [email or web address].   Also download the MyChart app! Go to the app store, search "MyChart", open the app, select , and log in with your MyChart username and password.

## 2023-05-30 NOTE — Progress Notes (Signed)
[facility]   [phone] [phone]   Clinic Follow up Note   Patient Care Team: Shon Hale, MD as PCP - General (Family Medicine) Runell Gess, MD as PCP - Cardiology (Cardiology) Malachy Mood, MD as Consulting Physician (Oncology)  Date of Service:  05/30/2023  CHIEF COMPLAINT: f/u of gallbladder cancer   CURRENT THERAPY:  cisplatin and gemcitabine every 14 days with durvalumab every 28 days   ASSESSMENT:  [name] is a 82 y.o. female with   Gallbladder cancer (HCC) cT4N0M0, stage III -Patient had 1 episode of abdominal pain in October 2023, and was found to have a large gallbladder stone. -She underwent laparoscopic attempted cholecystectomy but was not able to be resected due to adhesions to due to them in the colon. -She developed jaundice in June 2024, status post ERCP and stent placement in CBD.  CT and MRI scan showed a liver mass adjacent to the gallbladder fossae, and a biopsy confirmed adenocarcinoma.  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  Unfortunately this was felt to be not resectable by Dr. Freida Busman. -I reviewed her CT chest from April 22, 2023, which showed no evidence of metastasis. -I recommended first-line cisplatin, gemcitabine  every 14 days and durvalumab every 28 days.  If she responded well, will stop chemo after 4 to 6 months of treatment, and changed to maintenance therapy. She started on 05/03/23 every 2 weeks and has been tolerating well   Essential hypertension -Follow-up with PCP  -Continue medication, -Will monitor her blood glucose and blood pressure closely during her chemotherapy    DM2 (diabetes mellitus, type 2) (HCC) -Follow-up with PCP and other specialists -Continue medication, -Will monitor her blood glucose and blood pressure closely during her chemotherapy      PLAN: -lab  reviewed -I called in oral mag today, she get IV mag 4g with chemo  -proceed with C3 imfinzi/gemzar/platinol today -lab/flush and f/u in 06/14/2023   SUMMARY OF ONCOLOGIC HISTORY: Oncology History Overview Note   Cancer Staging  Gallbladder cancer [facility]) Staging form: Distal Bile Duct, AJCC 8th Edition - Clinical stage from 04/09/2023: Stage IIIB (cT4, cN0, cM0) - Signed by Malachy Mood, MD on 04/18/2023 Total positive nodes: 0 Histologic grade (G): G2 Histologic grading system: 3 grade system     Gallbladder cancer (HCC)  04/04/2023 Imaging    IMPRESSION: 1. Ill-defined hypodensity in the region of the gallbladder fossa appears similar to the prior study given lack of contrast. This would be better evaluated with dedicated MRI. 2. Nonobstructing right renal calculus. 3. Large amount of stool throughout the colon. 4. Stable indeterminate lucent areas in the vertebral bodies of the lumbar spine. 5. Severe atherosclerotic disease.     04/09/2023 Pathology Results     FINAL MICROSCOPIC DIAGNOSIS:   A. LIVER, ADJACENT TO GALLBLADDER FOSSA, BIOPSY:  - Moderately differentiated adenocarcinoma    04/09/2023 Cancer Staging   Staging form: Distal Bile Duct, AJCC 8th Edition - Clinical stage from 04/09/2023: Stage IIIB (cT4, cN0, cM0) - Signed by Malachy Mood, MD on 04/18/2023 Total positive nodes: 0 Histologic grade (G): G2 Histologic grading system: 3 grade system   04/18/2023 Initial Diagnosis   Cholangiocarcinoma of liver (HCC)   05/03/2023 -  Chemotherapy   Patient is on Treatment Plan : BILIARY TRACT Cisplatin + Gemcitabine D1,15 + Durvalumab (1500) D1 q28d / Durvalumab (1500) q28d  INTERVAL HISTORY:  [name] is here for a follow up of gallbladder cancer . She was last seen by  NP Heather on 05/16/2023. She presents to the clinic alone. Pt state that she is very fatigue after every treatment. She states that she is able to care for herself.Her appetite is fair and she is   very active around her home. Her BM issues foe as diarrhea has improve. She has some intermittent pain in he abdomin. Pt state before stating chemo she didn't have the fatigue, but is tolerable.    All other systems were reviewed with the patient and are negative.  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 There is a small calcified mass in the L:A near the LA appendage. No valvular masses seen with associated mitral annular calcification. LA Volume/ BSA27.4 ml/m2 No AS. Right ventricular systolic pressure is elevated at .   Jaundice    Myocardial infarction [facility])    June 2021   Neuromuscular disorder (HCC)    neuropathy in bilateral feet   Peripheral vascular disease (HCC)    Pneumonia    not hosp.    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  PTCAs with drug coated balloon Lt ant tibial artery and Lt popliteal artery  11/26/2019    SURGICAL HISTORY: Past Surgical History:  Procedure Laterality Date   ABDOMINAL AORTAGRAM  11/25/2019   ABDOMINAL AORTOGRAM W/LOWER EXTREMITY    ABDOMINAL AORTOGRAM N/A 06/15/2019   Procedure: ABDOMINAL AORTOGRAM;  Surgeon: Runell Gess, MD;  [facility];  Service: Cardiovascular;  Laterality: N/A;   ABDOMINAL AORTOGRAM W/LOWER EXTREMITY N/A 11/25/2019   Procedure: ABDOMINAL AORTOGRAM W/LOWER EXTREMITY;  Surgeon: Iran Ouch,  MD;  [facility]: [facility];  Service: Cardiovascular;  Laterality: N/A;  Lt leg   ABDOMINAL AORTOGRAM W/LOWER EXTREMITY N/A 08/01/2020   Procedure: ABDOMINAL AORTOGRAM W/LOWER EXTREMITY;  Surgeon: Runell Gess, MD;  [facility];  Service: Cardiovascular;  Laterality: N/A;   ABDOMINAL AORTOGRAM W/LOWER EXTREMITY N/A 04/28/2021   Procedure: ABDOMINAL AORTOGRAM W/LOWER EXTREMITY;  Surgeon: Sherren Kerns, MD;  [facility];  Service: Cardiovascular;  Laterality: N/A;   ABDOMINAL AORTOGRAM W/LOWER EXTREMITY N/A 10/13/2021   Procedure: ABDOMINAL AORTOGRAM W/LOWER EXTREMITY;  Surgeon: Leonie Douglas, MD;  [facility];  Service: Cardiovascular;  Laterality: N/A;   ABDOMINAL AORTOGRAM W/LOWER EXTREMITY N/A 11/09/2022   Procedure: ABDOMINAL AORTOGRAM W/LOWER EXTREMITY;  Surgeon: Leonie Douglas, MD;  [facility];  Service: Cardiovascular;  Laterality: N/A;   ABDOMINAL AORTOGRAM W/LOWER EXTREMITY N/A 11/16/2022   Procedure: ABDOMINAL AORTOGRAM W/LOWER EXTREMITY;  Surgeon: Leonie Douglas, MD;  [facility];  Service: Cardiovascular;  Laterality: N/A;   AMPUTATION TOE  Left 05/03/2021   Procedure: AMPUTATION OF THIRD LEFT  TOE;  Surgeon: Leonie Douglas, MD;  Location: [facility] OR;  Service: Vascular;  Laterality: Left;   APPENDECTOMY     APPLICATION OF WOUND VAC Right 07/21/2019   Procedure: Application Of Prevena Wound Vac Right Groin;  Surgeon: Nada Libman, MD;  Location: [facility] OR;  Service: Vascular;  Laterality: Right;   BILIARY BRUSHING  04/07/2023   Procedure: BILIARY BRUSHING;  Surgeon: Vida Rigger, MD;  Location: [facility] ENDOSCOPY;  Service: Gastroenterology;;   BILIARY STENT PLACEMENT  04/07/2023   Procedure: BILIARY STENT PLACEMENT;  Surgeon: Vida Rigger, MD;  Location: [facility] ENDOSCOPY;  Service: Gastroenterology;;   CARPAL TUNNEL RELEASE Left    CARPAL TUNNEL RELEASE Right    CESAREAN SECTION     x 2    CHOLECYSTECTOMY N/A 10/12/2022   Procedure: LAPAROSCOPIC PARTIAL FENESTRATING CHOLECYSTECTOMY;  Surgeon: Fritzi Mandes, MD;  [facility];  Service: General;  Laterality: N/A;   COLONOSCOPY     CORONARY ARTERY BYPASS GRAFT N/A 03/24/2020   Procedure: CORONARY ARTERY BYPASS GRAFTING (CABG) using LIMA to LAD (m); RIMA to RAMUS; Endoscopic Right Greater Saphenous Vein: SVG to Diag1; SVG to PLB (right); and SVG to PL (left).;  Surgeon: Linden Dolin, MD;  [facility];  Service: Open Heart Surgery;  Laterality: N/A;  BILATERAL IMA   ENDARTERECTOMY FEMORAL Right 07/21/2019   Procedure: RIGHT ENDARTERECTOMY FEMORAL WITH PATCH ANGIOPLASTY;  Surgeon: Nada Libman, MD;  [facility];  Service: Vascular;  Laterality: Right;   ENDARTERECTOMY FEMORAL Right 08/16/2020   Procedure: RIGHT FEMORAL ENDARTERECTOMY;  Surgeon: Maeola Harman, MD;  Location: [facility] OR;  Service: Vascular;  Laterality: Right;   ENDOVEIN HARVEST OF GREATER SAPHENOUS VEIN Right 03/24/2020   Procedure: ENDOVEIN HARVEST OF GREATER SAPHENOUS VEIN;  Surgeon: Linden Dolin, MD;  [facility];  Service: Open Heart Surgery;  Laterality: Right;   ERCP N/A 04/07/2023   Procedure: ENDOSCOPIC RETROGRADE CHOLANGIOPANCREATOGRAPHY (ERCP);  Surgeon: Vida Rigger, MD;  Location: [facility] ENDOSCOPY;  Service: Gastroenterology;  Laterality: N/A;   EYE SURGERY     cataract removal bilaterally   FEMORAL-POPLITEAL BYPASS GRAFT Right 08/16/2020   Procedure: BYPASS GRAFT FEMORAL-POPLITEAL ARTERY;  Surgeon: Maeola Harman, MD;  Location: [facility] OR;  Service: Vascular;  Laterality: Right;   FEMORAL-POPLITEAL BYPASS GRAFT Left 05/03/2021   Procedure: LEFT FEMORAL TO BELOW KNEE POPLITEAL ARTERY BYPASS GRAFTING WITH 6MMX80 PTFE GRAFT;  Surgeon: Leonie Douglas, MD;  [facility];  Service: Vascular;  Laterality: Left;   LEFT HEART CATH AND CORONARY ANGIOGRAPHY N/A 03/22/2020   Procedure: LEFT HEART CATH AND CORONARY ANGIOGRAPHY;   Surgeon: Swaziland, Peter M, MD;  Location: [facility] INVASIVE CV LAB;  Service: Cardiovascular;  Laterality: N/A;   LOWER EXTREMITY ANGIOGRAPHY Bilateral 06/15/2019   Procedure: Lower Extremity Angiography;  Surgeon: Runell Gess, MD;  Location: [facility] INVASIVE CV LAB;  Service: Cardiovascular;  Laterality: Bilateral;   LOWER EXTREMITY ANGIOGRAPHY Right 08/03/2019   Procedure: LOWER EXTREMITY ANGIOGRAPHY;  Surgeon: Runell Gess, MD;  [facility];  Service: Cardiovascular;  Laterality: Right;   LOWER EXTREMITY ANGIOGRAPHY N/A 09/07/2021   Procedure: LOWER EXTREMITY ANGIOGRAPHY;  Surgeon: Cephus Shelling, MD;  [facility];  Service: Cardiovascular;  Laterality: N/A;   PATCH ANGIOPLASTY Right 07/21/2019   Procedure: Patch Angioplasty Right Femoral Artery;  Surgeon: Nada Libman, MD;  Location: [facility] OR;  Service: Vascular;  Laterality: Right;  PERIPHERAL INTRAVASCULAR LITHOTRIPSY  11/25/2019   Procedure: INTRAVASCULAR LITHOTRIPSY;  Surgeon: Iran Ouch, MD;  [facility];  Service: Cardiovascular;;  LT. SFA   PERIPHERAL VASCULAR ATHERECTOMY  08/03/2019   Procedure: PERIPHERAL VASCULAR ATHERECTOMY;  Surgeon: Runell Gess, MD;  Location: [facility] INVASIVE CV LAB;  Service: Cardiovascular;;  right SFA, right TP trunk   PERIPHERAL VASCULAR ATHERECTOMY  11/25/2019   Procedure: PERIPHERAL VASCULAR ATHERECTOMY;  Surgeon: Iran Ouch, MD;  [facility];  Service: Cardiovascular;;  Lt.  POPLITEAL and AT   PERIPHERAL VASCULAR BALLOON ANGIOPLASTY Left 06/15/2019   Procedure: PERIPHERAL VASCULAR BALLOON ANGIOPLASTY;  Surgeon: Runell Gess, MD;  [facility];  Service: Cardiovascular;  Laterality: Left;  SFA UNSUCCESSFUL UNABLE TO CROSS LESION   PERIPHERAL VASCULAR BALLOON ANGIOPLASTY  08/03/2019   Procedure: PERIPHERAL VASCULAR BALLOON ANGIOPLASTY;  Surgeon: Runell Gess, MD;  [facility];   Service: Cardiovascular;;  right SFA, Right TP trunk   PERIPHERAL VASCULAR BALLOON ANGIOPLASTY  09/07/2021   Procedure: PERIPHERAL VASCULAR BALLOON ANGIOPLASTY;  Surgeon: Cephus Shelling, MD;  [facility];  Service: Cardiovascular;;   PERIPHERAL VASCULAR BALLOON ANGIOPLASTY Right 10/13/2021   Procedure: PERIPHERAL VASCULAR BALLOON ANGIOPLASTY;  Surgeon: Leonie Douglas, MD;  [facility];  Service: Cardiovascular;  Laterality: Right;   PERIPHERAL VASCULAR BALLOON ANGIOPLASTY  11/09/2022   Procedure: PERIPHERAL VASCULAR BALLOON ANGIOPLASTY;  Surgeon: Leonie Douglas, MD;  [facility];  Service: Cardiovascular;;  fem-pop bypass and AT   PORTACATH PLACEMENT Left 04/29/2023   Procedure: INSERTION PORT-A-CATH;  Surgeon: Fritzi Mandes, MD;  Location: [facility] ORS;  Service: General;  Laterality: Left;   REMOVAL OF STONES  04/07/2023   Procedure: REMOVAL OF STONES;  Surgeon: Vida Rigger, MD;  Location: [facility] ENDOSCOPY;  Service: Gastroenterology;;   Dennison Mascot  04/07/2023   Procedure: Dennison Mascot;  Surgeon: Vida Rigger, MD;  Location: [facility] ENDOSCOPY;  Service: Gastroenterology;;   TEE WITHOUT CARDIOVERSION N/A 03/24/2020   Procedure: TRANSESOPHAGEAL ECHOCARDIOGRAM (TEE);  Surgeon: Linden Dolin, MD;  Location: [facility] OR;  Service: Open Heart Surgery;  Laterality: N/A;   TONSILLECTOMY     and adenoidectomy   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/2019   Procedure: TRANSMETATARSAL AMPUTATION;  Surgeon: Vivi Barrack, DPM;  [facility] OR;  Service: Podiatry;  Laterality: Right;   TRANSMETATARSAL AMPUTATION Left 05/11/2021   Procedure: LEFT TRANSMETATARSAL AMPUTATION;  Surgeon: Maeola Harman, MD;  Location: [facility] OR;  Service: Vascular;  Laterality: Left;   TUBAL LIGATION      I have reviewed the social history and family history with the patient and they are unchanged from previous note.  ALLERGIES:  is allergic to versed [midazolam],  augmentin [amoxicillin-pot clavulanate], bactrim [sulfamethoxazole-trimethoprim], demerol [meperidine], meperidine hcl, scopolamine, and tramadol.  MEDICATIONS:  Current Outpatient Medications  Medication Sig Dispense Refill   magnesium oxide (MAG-OX) 400 (240 Mg) MG tablet Take 1 tablet (400 mg total) by mouth 2 (two) times daily. 60 tablet 1   aspirin EC 81 MG tablet Take 81 mg by mouth at bedtime. Swallow whole.     atorvastatin (LIPITOR) 80 MG tablet TAKE 1 TABLET BY MOUTH ONCE DAILY. MUST  KEEP  UPCOMING  APPOINTMENT  FOR  FUTURE  REFILLS 90 tablet 0   brimonidine (ALPHAGAN) 0.2 % ophthalmic solution Place 1 drop into the right eye 2 (two) times daily.     hydrochlorothiazide (HYDRODIURIL) 12.5 MG tablet  Take 1 tablet by mouth once daily 90 tablet 0   Insulin Lispro Prot & Lispro (HUMALOG MIX 75/25 KWIKPEN) (75-25) 100 UNIT/ML Kwikpen INJECT 15 UNITS SUBCUTANEOUSLY ONCE DAILY WITH BREAKFAST (Patient taking differently: Inject 4-8 Units into the skin See admin instructions. Inject 8 units into the skin every morning and 4 units every evening.) 15 mL 0   Insulin Pen Needle (ULTRA-THIN II MINI PEN NEEDLE) 31G X 5 MM MISC USE AS DIRECTED 50 each 2   JARDIANCE 25 MG TABS tablet TAKE 1 TABLET BY MOUTH ONCE DAILY BEFORE BREAKFAST 90 tablet 3   Lancets (ONETOUCH DELICA PLUS LANCET33G) MISC Use to check blood sugars twice daily. (Patient taking differently: Use to check blood sugars  daily.) 100 each 5   lidocaine-prilocaine (EMLA) cream Apply to affected area once 30 g 3   metFORMIN (GLUCOPHAGE) 1000 MG tablet Take 1 tablet by mouth twice daily 180 tablet 0   metoprolol tartrate (LOPRESSOR) 50 MG tablet Take 1 tablet (50 mg total) by mouth 2 (two) times daily. 180 tablet 0   Multiple Vitamins-Minerals (MULTI FOR HER PO) Take by mouth.     mupirocin ointment (BACTROBAN) 2 % Apply 1 Application topically 2 (two) times daily. (Patient taking differently: Apply 1 Application topically 2 (two) times daily.  As needed) 30 g 2   ondansetron (ZOFRAN) 8 MG tablet Take 1 tablet (8 mg total) by mouth every 8 (eight) hours as needed for nausea or vomiting. Start on the third day after cisplatin. 30 tablet 1   ONETOUCH VERIO test strip USE TO CHECK BLOOD SUGARS ONCE DAILY 100 each 2   pantoprazole (PROTONIX) 40 MG tablet Take 1 tablet by mouth once daily 90 tablet 3   prochlorperazine (COMPAZINE) 10 MG tablet Take 1 tablet (10 mg total) by mouth every 6 (six) hours as needed (Nausea or vomiting). 30 tablet 1   Propylene Glycol (SYSTANE BALANCE) 0.6 % SOLN Place 1 drop into the left eye 3 (three) times daily as needed (dry/irritated eyes).     repaglinide (PRANDIN) 2 MG tablet Take 1 tab by breakfast and 1 tab before dinner (Patient taking differently: Take 2 mg by mouth daily.) 180 tablet 2   telmisartan (MICARDIS) 80 MG tablet Take 1 tablet by mouth once daily 90 tablet 3   XARELTO 2.5 MG TABS tablet Take 1 tablet by mouth twice daily 60 tablet 0   No current facility-administered medications for this visit.   Facility-Administered Medications Ordered in Other Visits  Medication Dose Route Frequency Provider Last Rate Last Admin   CISplatin (PLATINOL) 34 mg in sodium chloride 0.9 % 250 mL chemo infusion  20 mg/m2 (Treatment Plan Recorded) Intravenous Once Malachy Mood, MD       durvalumab (IMFINZI) 1,500 mg in sodium chloride 0.9 % 100 mL chemo infusion  1,500 mg Intravenous Once Malachy Mood, MD       fosaprepitant (EMEND) 150 mg in sodium chloride 0.9 % 145 mL IVPB  150 mg Intravenous Once Malachy Mood, MD       gemcitabine (GEMZAR) 1,369 mg in sodium chloride 0.9 % 250 mL chemo infusion  800 mg/m2 (Treatment Plan Recorded) Intravenous Once Malachy Mood, MD       heparin lock flush 100 unit/mL  500 Units Intracatheter Once PRN Malachy Mood, MD       magnesium sulfate IVPB 2 g 50 mL  2 g Intravenous Once Malachy Mood, MD       palonosetron (ALOXI) injection 0.25 mg  0.25 mg Intravenous Once Malachy Mood, MD       sodium  chloride flush (NS) 0.9 % injection 10 mL  10 mL Intracatheter PRN Malachy Mood, MD        PHYSICAL EXAMINATION: ECOG PERFORMANCE STATUS: 1 - Symptomatic but completely ambulatory  Vitals:   05/30/23 0815  BP: (!) 135/48  Pulse: 95  Resp: 17  Temp: 97.8 F (36.6 C)  SpO2: 100%   Wt Readings from Last 3 Encounters:  05/30/23 134 lb (60.8 kg)  05/16/23 134 lb 8 oz (61 kg)  05/03/23 137 lb 2 oz (62.2 kg)     GENERAL:alert, no distress and comfortable SKIN: skin color normal, no rashes or significant lesions EYES: normal, Conjunctiva are pink and non-injected, sclera clear  NEURO: alert & oriented x 3 with fluent speech  LABORATORY DATA:  I have reviewed the data as listed    Latest Ref Rng & Units 05/30/2023    7:37 AM 05/16/2023    8:45 AM 05/02/2023    7:52 AM  CBC  WBC 4.0 - 10.5 K/uL 13.9  12.9  14.2   Hemoglobin 12.0 - 15.0 g/dL 9.2  9.6  65.7   Hematocrit 36.0 - 46.0 % 28.3  29.3  32.8   Platelets 150 - 400 K/uL 390  370  321         Latest Ref Rng & Units 05/30/2023    7:37 AM 05/16/2023    8:45 AM 05/02/2023    7:52 AM  CMP  Glucose 70 - 99 mg/dL 846  962  952   BUN 8 - 23 mg/dL 25  26  43   Creatinine 0.44 - 1.00 mg/dL 8.41  3.24  4.01   Sodium 135 - 145 mmol/L 140  135  139   Potassium 3.5 - 5.1 mmol/L 3.9  4.1  4.1   Chloride 98 - 111 mmol/L 102  98  103   CO2 22 - 32 mmol/L 27  24  25    Calcium 8.9 - 10.3 mg/dL 9.6  9.3  02.7   Total Protein 6.5 - 8.1 g/dL 6.5  6.9  7.1   Total Bilirubin 0.3 - 1.2 mg/dL 0.8  0.9  1.2   Alkaline Phos 38 - 126 U/L 281  265  310   AST 15 - 41 U/L 68  42  32   ALT 0 - 44 U/L 54  31  20       RADIOGRAPHIC STUDIES: I have personally reviewed the radiological images as listed and agreed with the findings in the report. No results found.    Orders Placed This Encounter  Procedures   CBC with Differential (Cancer Center Only)    Standing Status:   Future    Standing Expiration Date:   06/26/2024   CMP (Cancer Center only)     Standing Status:   Future    Standing Expiration Date:   06/26/2024   T4    Standing Status:   Future    Standing Expiration Date:   06/26/2024   TSH    Standing Status:   Future    Standing Expiration Date:   06/26/2024   Magnesium    Standing Status:   Future    Standing Expiration Date:   06/26/2024   CBC with Differential (Cancer Center Only)    Standing Status:   Future    Standing Expiration Date:   07/10/2024   CMP (Cancer Center only)    Standing Status:  Future    Standing Expiration Date:   07/10/2024   T4    Standing Status:   Future    Standing Expiration Date:   07/10/2024   TSH    Standing Status:   Future    Standing Expiration Date:   07/10/2024   Magnesium    Standing Status:   Future    Standing Expiration Date:   07/10/2024   Cancer antigen 19-9    Standing Status:   Standing    Number of Occurrences:   20    Standing Expiration Date:   05/29/2024   All questions were answered. The patient knows to call the clinic with any problems, questions or concerns. No barriers to learning was detected. The total time spent in the appointment was 30 minutes.     Malachy Mood, MD 05/30/2023   Carolin Coy, CMA, am acting as scribe for Malachy Mood, MD.   I have reviewed the above documentation for accuracy and completeness, and I agree with the above.

## 2023-05-31 ENCOUNTER — Other Ambulatory Visit: Payer: Self-pay

## 2023-05-31 ENCOUNTER — Encounter: Payer: Self-pay | Admitting: Cardiovascular Disease

## 2023-05-31 ENCOUNTER — Ambulatory Visit: Payer: Medicare Other | Attending: Cardiovascular Disease | Admitting: Cardiovascular Disease

## 2023-05-31 VITALS — BP 140/60 | HR 91 | Ht 65.0 in | Wt 136.4 lb

## 2023-05-31 DIAGNOSIS — E785 Hyperlipidemia, unspecified: Secondary | ICD-10-CM | POA: Diagnosis not present

## 2023-05-31 DIAGNOSIS — Z951 Presence of aortocoronary bypass graft: Secondary | ICD-10-CM

## 2023-05-31 DIAGNOSIS — I6521 Occlusion and stenosis of right carotid artery: Secondary | ICD-10-CM | POA: Diagnosis not present

## 2023-05-31 DIAGNOSIS — I10 Essential (primary) hypertension: Secondary | ICD-10-CM

## 2023-05-31 NOTE — Assessment & Plan Note (Signed)
History of CAD status post cardiac catheterization by Dr. Swaziland after being admitted with a non-STEMI 03/13/2020 revealing three-vessel disease.  She had CABG x 5 by Dr. Renaldo Fiddler 03/24/2020 with an excellent result and discharged 1 week later.  She denies chest pain or shortness of breath.

## 2023-05-31 NOTE — Patient Instructions (Signed)
Medication Instructions:  Your physician recommends that you continue on your current medications as directed. Please refer to the Current Medication list given to you today.  *If you need a refill on your cardiac medications before your next appointment, please call your pharmacy*   Testing/Procedures: Carotid doppler march 2025 Your physician has requested that you have a carotid duplex. This test is an ultrasound of the carotid arteries in your neck. It looks at blood flow through these arteries that supply the brain with blood. Allow one hour for this exam. There are no restrictions or special instructions.    Follow-Up: At [facility], you and your health needs are our priority.  As part of our continuing mission to provide you with exceptional heart care, we have created designated Provider Care Teams.  These Care Teams include your primary Cardiologist (physician) and Advanced Practice Providers (APPs -  Physician Assistants and Nurse Practitioners) who all work together to provide you with the care you need, when you need it.   Your next appointment:   6 month(s)  Provider:   Bernadene Person, NP    Then, Nanetta Batty, MD will plan to see you again in 1 year(s).

## 2023-05-31 NOTE — Progress Notes (Signed)
05/31/2023 EDNER SHUMARD   [birth year]  [id number]  Primary Physician Shon Hale, MD Primary Cardiologist: Runell Gess MD [name], [place]  HPI:  [name] is a 82 y.o.  mildly overweight widowed Caucasian female mother of 3, grandmother 7 grandchildren referred by Dr. Ardelle Anton, her podiatrist for critical limb ischemia.  I last saw her in the office 12/19/2021. She has a history of treated hypertension, diabetes and hyperlipidemia.  She is never had a heart attack or stroke.  She denies chest pain or shortness of breath.  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  She has some local trauma related to socks over Christmas and has developed ischemic appearing wounds on her right second and third toes with recent Dopplers performed 10/09/2018 revealing a right ABI 0.75, left 2.79 with moderately severe right SFA stenosis and an occluded right posterior tibial artery.  She is seeing Dr. Ardelle Anton back regularly for aggressive local wound care.    Her wounds continue to heal on her right and left second toes.  These sores are probably a result of local trauma.  Recent ABIs are in the 0.6  range with a high-grade right SFA stenosis and an occluded left popliteal with tibial vessel disease as well.  She does complain of some claudication.  She wishes to proceed with endovascular therapy for wound healing and lifestyle limiting claudication.   I performed angiography on her 06/15/2019 with a failed attempt at crossing the left SFA CTO.  She has one-vessel runoff bilaterally.  She did develop gangrenous right first and second toes.  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 Myra Gianotti who performed right common femoral endarterectomy with patch angioplasty 07/21/2019 with excellent result.  She did see Dr. Ardelle Anton back in the office late last  week who thought her wounds were stable.  The pain in her feet has improved since her endarterectomy.  The plan is to perform orbital atherectomy and PTA of her calcified right SFA as well as intervention on her right tibioperoneal trunk prior to right TMA by Dr. Loreta Ave for limb salvage.   I performed orbital atherectomy followed by drug-coated balloon angioplasty of her right SFA, popliteal and tibioperoneal trunk on 08/03/2019 with excellent result.  Her follow-up Dopplers performed 08/14/2019 revealed a widely patent SFA and tibioperoneal trunk.  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 Ardelle Anton on 08/07/2019 which almost completely healed with the assistance of hyperbaric oxygen.   She has been seeing Dr. Leanord Hawking in the wound care clinic on a weekly basis.  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  She is on 2 IV antibiotics and continues to get hyperbaric oxygen.  Dr. Leanord Hawking does not feel these will heal without inline flow.  The patient is a candidate for tibial pedal access of the left dorsalis pedis.  I reviewed her anatomy with Dr. Kirke Corin who i performed tibial pedal intervention in March of this year resulting healing of her left foot wound. She did undergo right TMA by Dr. Ardelle Anton which healed nicely.   She was admitted with non-STEMI on 03/13/2020 and underwent cardiac catheterization by Dr. [place] revealing three-vessel disease. 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 Renaldo Fiddler 03/24/2020 with an excellent result and was discharged 1 week later. She is  recuperated nicely. She does have a small nonhealing wound and a vein harvest site in her right calf. She denies claudication. Recent Dopplers performed 06/08/2020 did reveal an occluded right SFA although she was asymptomatic from this.   Unfortunately she had developed breakdown of her right TMA healed wound now with a small open wound that has been draining.  She does get pain in  her leg when she walks.  Based on this I decided to proceed with urgent angiography potential endovascular therapy for limb salvage.  I angiogrammed her 08/01/2020 revealing an occluded right SFA from the origin to the adductor canal with one-vessel runoff via peroneal.  She did have an 80% tibioperoneal trunk stenosis and 80% restenosis in her prior common femoral endarterectomy site.  She underwent right common femoral endarterectomy with right femoral to above-the-knee popliteal bypass graft by Dr. Randie Heinz.  She recuperated nicely.  Her TMA wound healed.  Her most recent Doppler studies performed 09/30/2020 revealed a widely patent graft.  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  She is lost 12 pound since I last saw her.  Thankfully, she is remained stable from a cardiovascular and vascular point of view denying chest pain or shortness of breath.  She has had no recurrent left lower extremity ulcers.   Current Meds  Medication Sig   aspirin EC 81 MG tablet Take 81 mg by mouth at bedtime. Swallow whole.   atorvastatin (LIPITOR) 80 MG tablet TAKE 1 TABLET BY MOUTH ONCE DAILY. MUST  KEEP  UPCOMING  APPOINTMENT  FOR  FUTURE  REFILLS   brimonidine (ALPHAGAN) 0.2 % ophthalmic solution Place 1 drop into the right eye 2 (two) times daily.   hydrochlorothiazide (HYDRODIURIL) 12.5 MG tablet Take 1 tablet by mouth once daily   Insulin Lispro Prot & Lispro (HUMALOG MIX 75/25 KWIKPEN) (75-25) 100 UNIT/ML Kwikpen INJECT 15 UNITS SUBCUTANEOUSLY ONCE DAILY WITH BREAKFAST (Patient taking differently: Inject 4-8 Units into the skin See admin instructions. Inject 8 units into the skin every morning and 4 units every evening.)   Insulin Pen Needle (ULTRA-THIN II MINI PEN NEEDLE) 31G X 5 MM MISC USE AS DIRECTED   JARDIANCE 25 MG TABS tablet TAKE 1 TABLET BY MOUTH ONCE DAILY BEFORE BREAKFAST   Lancets (ONETOUCH DELICA PLUS  LANCET33G) MISC Use to check blood sugars twice daily. (Patient taking differently: Use to check blood sugars  daily.)   lidocaine-prilocaine (EMLA) cream Apply to affected area once   magnesium oxide (MAG-OX) 400 (240 Mg) MG tablet Take 1 tablet (400 mg total) by mouth 2 (two) times daily.   metFORMIN (GLUCOPHAGE) 1000 MG tablet Take 1 tablet by mouth twice daily   metoprolol tartrate (LOPRESSOR) 50 MG tablet Take 1 tablet (50 mg total) by mouth 2 (two) times daily.   Multiple Vitamins-Minerals (MULTI FOR HER PO) Take by mouth.   mupirocin ointment (BACTROBAN) 2 % Apply 1 Application topically 2 (two) times daily. (Patient taking differently: Apply 1 Application topically 2 (two) times daily. As needed)   ondansetron (ZOFRAN) 8 MG tablet Take 1 tablet (8 mg total) by mouth every 8 (eight) hours as needed for nausea or vomiting. Start on the third day after cisplatin.   ONETOUCH VERIO test strip USE TO CHECK BLOOD SUGARS ONCE DAILY   pantoprazole (PROTONIX) 40 MG tablet Take 1 tablet by mouth once daily   prochlorperazine (COMPAZINE) 10 MG tablet Take  1 tablet (10 mg total) by mouth every 6 (six) hours as needed (Nausea or vomiting).   Propylene Glycol (SYSTANE BALANCE) 0.6 % SOLN Place 1 drop into the left eye 3 (three) times daily as needed (dry/irritated eyes).   repaglinide (PRANDIN) 2 MG tablet Take 1 tab by breakfast and 1 tab before dinner (Patient taking differently: Take 2 mg by mouth daily.)   telmisartan (MICARDIS) 80 MG tablet Take 1 tablet by mouth once daily   XARELTO 2.5 MG TABS tablet Take 1 tablet by mouth twice daily     Allergies  Allergen Reactions   Versed [Midazolam] Anxiety    Frantic, out of my mind, agitated    Augmentin [Amoxicillin-Pot Clavulanate] Other (See Comments)    Elevates potassium level   Bactrim [Sulfamethoxazole-Trimethoprim] Other (See Comments)    ^ K+( elevated)    Demerol [Meperidine]     Delusional    Meperidine Hcl     Other reaction(s):  delusional   Scopolamine     Delusional    Tramadol Other (See Comments)    Keeps awake    Social History   Socioeconomic History   Marital status: Widowed    Spouse name: Not on file   Number of children: 3   Years of education: Not on file   Highest education level: Not on file  Occupational History   Not on file  Tobacco Use   Smoking status: Former    Current packs/day: 0.00    Types: Cigarettes    Quit date: 03/31/1972    Years since quitting: 51.2   Smokeless tobacco: Never  Vaping Use   Vaping status: Never Used  Substance and Sexual Activity   Alcohol use: No   Drug use: No   Sexual activity: Not on file  Other Topics Concern   Not on file  Social History Narrative   Not on file   Social Determinants of Health   Financial Resource Strain: Not on file  Food Insecurity: No Food Insecurity (04/05/2023)   Hunger Vital Sign    Worried About Running Out of Food in the Last Year: Never true    Ran Out of Food in the Last Year: Never true  Transportation Needs: No Transportation Needs (04/05/2023)   PRAPARE - Administrator, Civil Service (Medical): No    Lack of Transportation (Non-Medical): No  Physical Activity: Not on file  Stress: Not on file  Social Connections: Unknown (02/02/2022)   Received from [facility]   Social Network    Social Network: Not on file  Intimate Partner Violence: Not At Risk (04/05/2023)   Humiliation, Afraid, Rape, and Kick questionnaire    Fear of Current or Ex-Partner: No    Emotionally Abused: No    Physically Abused: No    Sexually Abused: No     Review of Systems: General: negative for chills, fever, night sweats or weight changes.  Cardiovascular: negative for chest pain, dyspnea on exertion, edema, orthopnea, palpitations, paroxysmal nocturnal dyspnea or shortness of breath Dermatological: negative for rash Respiratory: negative for cough or wheezing Urologic: negative for hematuria Abdominal: negative for  nausea, vomiting, diarrhea, bright red blood per rectum, melena, or hematemesis Neurologic: negative for visual changes, syncope, or dizziness All other systems reviewed and are otherwise negative except as noted above.    Blood pressure (!) 140/60, pulse 91, height 5\' 5"  (1.651 m), weight 136 lb 6.4 oz (61.9 kg), SpO2 99%.  General appearance: alert and no distress Neck:  no adenopathy, no JVD, supple, symmetrical, trachea midline, thyroid not enlarged, symmetric, no tenderness/mass/nodules, and bilateral carotid bruits Lungs: clear to auscultation bilaterally Heart: Regular rate and rhythm without murmurs gallops rubs or clicks Extremities: extremities normal, atraumatic, no cyanosis or edema Pulses: Diminished pedal pulses bilaterally Skin: Skin color, texture, turgor normal. No rashes or lesions Neurologic: Grossly normal  EKG not performed today      ASSESSMENT AND PLAN:   Essential hypertension History of essential hypertension with blood pressure measured today at 140 over.  She is on hydrochlorothiazide, Micardis and metoprolol.  Dyslipidemia History of dyslipidemia on statin therapy with lipid profile performed 10/14/2021 revealing total cholesterol 97, LDL of 50 and HDL 29.  Carotid artery disease (HCC) History of carotid artery disease with Doppler study performed 11/30/2022 revealing moderate right ICA stenosis.  This will repeated on an annual basis.  PAD (peripheral artery disease) (HCC) History of PAD with critical limb ischemia the past.  She has had bilateral TMA's and multiple interventions by myself and Dr. Juanetta Gosling who follows her lower extremity arterial Doppler studies.  She currently has no wounds and denies claudication.  S/P CABG x 5 History of CAD status post cardiac catheterization by Dr. [place] after being admitted with a non-STEMI 03/13/2020 revealing three-vessel disease.  She had CABG x 5 by Dr. Renaldo Fiddler 03/24/2020 with an excellent result and discharged 1 week  later.  She denies chest pain or shortness of breath.     Runell Gess MD FACP,FACC,FAHA, [facility] 05/31/2023 4:09 PM

## 2023-05-31 NOTE — Assessment & Plan Note (Signed)
History of essential hypertension with blood pressure measured today at 140 over.  She is on hydrochlorothiazide, Micardis and metoprolol.

## 2023-05-31 NOTE — Assessment & Plan Note (Signed)
History of carotid artery disease with Doppler study performed 11/30/2022 revealing moderate right ICA stenosis.  This will repeated on an annual basis.

## 2023-05-31 NOTE — Assessment & Plan Note (Signed)
History of PAD with critical limb ischemia the past.  She has had bilateral TMA's and multiple interventions by myself and Dr. Juanetta Gosling who follows her lower extremity arterial Doppler studies.  She currently has no wounds and denies claudication.

## 2023-05-31 NOTE — Assessment & Plan Note (Signed)
History of dyslipidemia on statin therapy with lipid profile performed 10/14/2021 revealing total cholesterol 97, LDL of 50 and HDL 29.

## 2023-06-01 ENCOUNTER — Other Ambulatory Visit: Payer: Self-pay

## 2023-06-06 ENCOUNTER — Ambulatory Visit (INDEPENDENT_AMBULATORY_CARE_PROVIDER_SITE_OTHER): Payer: Medicare Other | Admitting: Podiatry

## 2023-06-06 ENCOUNTER — Encounter: Payer: Self-pay | Admitting: Podiatry

## 2023-06-06 DIAGNOSIS — E1142 Type 2 diabetes mellitus with diabetic polyneuropathy: Secondary | ICD-10-CM | POA: Diagnosis not present

## 2023-06-06 DIAGNOSIS — I739 Peripheral vascular disease, unspecified: Secondary | ICD-10-CM

## 2023-06-06 DIAGNOSIS — L84 Corns and callosities: Secondary | ICD-10-CM

## 2023-06-09 NOTE — Progress Notes (Signed)
Subjective: Chief Complaint  Patient presents with   Wound Check    Patient is here for a 4WK F/U Ulceration left foot, PAD     82 year old female presents the office today for follow-up evaluation of a wound on her left foot.  Unfortunately had missed her last appointment she is currently going treatment for gallbladder cancer.  No open lesions to her feet.  No swelling redness or drainage.  No other concerns.   Objective: AAO x3, NAD Status post bilateral transmetatarsal amputations.  Hyperkeratotic lesions noted on the distal aspect of the TMA sites bilaterally.  On the left side some of the dried blood present in the area is preulcerative but there is no skin breakdown today.  There is no edema, erythema, drainage or pus or any signs of infection today.  There are no other open lesions noted at this time. No pain with calf compression, swelling, warmth, erythema   Assessment: Ulceration left foot, PAD  Plan: -All treatment options discussed with the patient including all alternatives, risks, complications.  -Sharply debrided the hyperkeratotic lesions were any complications or bleeding.  The area is preulcerative and he is monitor closely for any signs or symptoms of infection or skin breakdown. -We previously have done instructed toe fillers but she states they cause more problems.  Continue supportive shoe gear daily foot inspection.  Return in about 2 months (around 08/06/2023).  Vivi Barrack DPM

## 2023-06-10 DIAGNOSIS — H5052 Exophoria: Secondary | ICD-10-CM | POA: Diagnosis not present

## 2023-06-10 DIAGNOSIS — H47011 Ischemic optic neuropathy, right eye: Secondary | ICD-10-CM | POA: Diagnosis not present

## 2023-06-10 DIAGNOSIS — H02834 Dermatochalasis of left upper eyelid: Secondary | ICD-10-CM | POA: Diagnosis not present

## 2023-06-10 DIAGNOSIS — H02831 Dermatochalasis of right upper eyelid: Secondary | ICD-10-CM | POA: Diagnosis not present

## 2023-06-10 DIAGNOSIS — H0102A Squamous blepharitis right eye, upper and lower eyelids: Secondary | ICD-10-CM | POA: Diagnosis not present

## 2023-06-10 DIAGNOSIS — H0102B Squamous blepharitis left eye, upper and lower eyelids: Secondary | ICD-10-CM | POA: Diagnosis not present

## 2023-06-10 DIAGNOSIS — Z961 Presence of intraocular lens: Secondary | ICD-10-CM | POA: Diagnosis not present

## 2023-06-11 ENCOUNTER — Other Ambulatory Visit: Payer: Medicare Other

## 2023-06-12 NOTE — Progress Notes (Unsigned)
Patient Care Team: Shon Hale, MD as PCP - General (Family Medicine) Runell Gess, MD as PCP - Cardiology (Cardiology) Malachy Mood, MD as Consulting Physician (Oncology)   CHIEF COMPLAINT: Follow-up gallbladder cancer  Oncology History Overview Note   Cancer Staging  Gallbladder cancer [facility]) Staging form: Distal Bile Duct, AJCC 8th Edition - Clinical stage from 04/09/2023: Stage IIIB (cT4, cN0, cM0) - Signed by Malachy Mood, MD on 04/18/2023 Total positive nodes: 0 Histologic grade (G): G2 Histologic grading system: 3 grade system     Gallbladder cancer (HCC)  04/04/2023 Imaging    IMPRESSION: 1. Ill-defined hypodensity in the region of the gallbladder fossa appears similar to the prior study given lack of contrast. This would be better evaluated with dedicated MRI. 2. Nonobstructing right renal calculus. 3. Large amount of stool throughout the colon. 4. Stable indeterminate lucent areas in the vertebral bodies of the lumbar spine. 5. Severe atherosclerotic disease.     04/09/2023 Pathology Results     FINAL MICROSCOPIC DIAGNOSIS:   A. LIVER, ADJACENT TO GALLBLADDER FOSSA, BIOPSY:  - Moderately differentiated adenocarcinoma    04/09/2023 Cancer Staging   Staging form: Distal Bile Duct, AJCC 8th Edition - Clinical stage from 04/09/2023: Stage IIIB (cT4, cN0, cM0) - Signed by Malachy Mood, MD on 04/18/2023 Total positive nodes: 0 Histologic grade (G): G2 Histologic grading system: 3 grade system   04/18/2023 Initial Diagnosis   Cholangiocarcinoma of liver (HCC)   05/03/2023 -  Chemotherapy   Patient is on Treatment Plan : BILIARY TRACT Cisplatin + Gemcitabine D1,15 + Durvalumab (1500) D1 q28d / Durvalumab (1500) q28d        CURRENT THERAPY: cisplatin and gemcitabine every 14 days with durvalumab every 28 days, starting 05/03/2023  INTERVAL HISTORY [name] returns for follow-up and treatment as scheduled, last seen by Dr. Mosetta Putt 05/30/2023 with cycle 2D1  Imfinzi/cis/gem.   ROS   Past Medical History:  Diagnosis Date   Anemia    after CABG in june 2021   Arthritis    Cancer [facility])    removal from nose - MOSE procedure    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   Complication of anesthesia    VERSED- agitated, muscle spasms, "jerking" , frantic , (never had this occurence in the pas)    Coronary artery disease    Diabetes mellitus without complication (HCC)    Dysrhythmia    PVC's   GERD (gastroesophageal reflux disease)    Heart murmur    History of hiatal hernia    Hyperlipidemia    Hypertension 11/20/2011   ECHO- EF>55% Borderline concentric left ventricular hypertrophy. There is a small calcified mass in the L:A near the LA appendage. No valvular masses seen with associated mitral annular calcification. LA Volume/ BSA27.4 ml/m2 No AS. Right ventricular systolic pressure is elevated at .   Jaundice    Myocardial infarction [facility])    June 2021   Neuromuscular disorder (HCC)    neuropathy in bilateral feet   Peripheral vascular disease (HCC)    Pneumonia    not hosp.    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  PTCAs with drug coated balloon Lt ant tibial artery and Lt popliteal artery  11/26/2019     Past Surgical History:  Procedure Laterality Date   ABDOMINAL AORTAGRAM  11/25/2019   ABDOMINAL AORTOGRAM W/LOWER EXTREMITY    ABDOMINAL AORTOGRAM N/A 06/15/2019   Procedure: ABDOMINAL AORTOGRAM;  Surgeon: Runell Gess, MD;  [facility];  Service: Cardiovascular;  Laterality: N/A;   ABDOMINAL AORTOGRAM W/LOWER EXTREMITY N/A 11/25/2019   Procedure: ABDOMINAL AORTOGRAM W/LOWER EXTREMITY;  Surgeon: Iran Ouch, MD;  [facility];  Service: Cardiovascular;  Laterality: N/A;  Lt leg   ABDOMINAL AORTOGRAM W/LOWER EXTREMITY N/A 08/01/2020   Procedure: ABDOMINAL AORTOGRAM W/LOWER EXTREMITY;  Surgeon: Runell Gess, MD;  [facility];  Service:  Cardiovascular;  Laterality: N/A;   ABDOMINAL AORTOGRAM W/LOWER EXTREMITY N/A 04/28/2021   Procedure: ABDOMINAL AORTOGRAM W/LOWER EXTREMITY;  Surgeon: Sherren Kerns, MD;  [facility];  Service: Cardiovascular;  Laterality: N/A;   ABDOMINAL AORTOGRAM W/LOWER EXTREMITY N/A 10/13/2021   Procedure: ABDOMINAL AORTOGRAM W/LOWER EXTREMITY;  Surgeon: Leonie Douglas, MD;  [facility];  Service: Cardiovascular;  Laterality: N/A;   ABDOMINAL AORTOGRAM W/LOWER EXTREMITY N/A 11/09/2022   Procedure: ABDOMINAL AORTOGRAM W/LOWER EXTREMITY;  Surgeon: Leonie Douglas, MD;  [facility];  Service: Cardiovascular;  Laterality: N/A;   ABDOMINAL AORTOGRAM W/LOWER EXTREMITY N/A 11/16/2022   Procedure: ABDOMINAL AORTOGRAM W/LOWER EXTREMITY;  Surgeon: Leonie Douglas, MD;  [facility];  Service: Cardiovascular;  Laterality: N/A;   AMPUTATION TOE Left 05/03/2021   Procedure: AMPUTATION OF THIRD LEFT  TOE;  Surgeon: Leonie Douglas, MD;  Location: [facility] OR;  Service: Vascular;  Laterality: Left;   APPENDECTOMY     APPLICATION OF WOUND VAC Right 07/21/2019   Procedure: Application Of Prevena Wound Vac Right Groin;  Surgeon: Nada Libman, MD;  Location: [facility] OR;  Service: Vascular;  Laterality: Right;   BILIARY BRUSHING  04/07/2023   Procedure: BILIARY BRUSHING;  Surgeon: Vida Rigger, MD;  Location: [facility] ENDOSCOPY;  Service: Gastroenterology;;   BILIARY STENT PLACEMENT  04/07/2023   Procedure: BILIARY STENT PLACEMENT;  Surgeon: Vida Rigger, MD;  Location: [facility] ENDOSCOPY;  Service: Gastroenterology;;   CARPAL TUNNEL RELEASE Left    CARPAL TUNNEL RELEASE Right    CESAREAN SECTION     x 2   CHOLECYSTECTOMY N/A 10/12/2022   Procedure: LAPAROSCOPIC PARTIAL FENESTRATING CHOLECYSTECTOMY;  Surgeon: Fritzi Mandes, MD;  [facility];  Service: General;  Laterality: N/A;   COLONOSCOPY     CORONARY ARTERY BYPASS GRAFT N/A 03/24/2020   Procedure: CORONARY ARTERY  BYPASS GRAFTING (CABG) using LIMA to LAD (m); RIMA to RAMUS; Endoscopic Right Greater Saphenous Vein: SVG to Diag1; SVG to PLB (right); and SVG to PL (left).;  Surgeon: Linden Dolin, MD;  [facility];  Service: Open Heart Surgery;  Laterality: N/A;  BILATERAL IMA   ENDARTERECTOMY FEMORAL Right 07/21/2019   Procedure: RIGHT ENDARTERECTOMY FEMORAL WITH PATCH ANGIOPLASTY;  Surgeon: Nada Libman, MD;  [facility];  Service: Vascular;  Laterality: Right;   ENDARTERECTOMY FEMORAL Right 08/16/2020   Procedure: RIGHT FEMORAL ENDARTERECTOMY;  Surgeon: Maeola Harman, MD;  Location: [facility] OR;  Service: Vascular;  Laterality: Right;   ENDOVEIN HARVEST OF GREATER SAPHENOUS VEIN Right 03/24/2020   Procedure: ENDOVEIN HARVEST OF GREATER SAPHENOUS VEIN;  Surgeon: Linden Dolin, MD;  [facility];  Service: Open Heart Surgery;  Laterality: Right;   ERCP N/A 04/07/2023   Procedure: ENDOSCOPIC RETROGRADE CHOLANGIOPANCREATOGRAPHY (ERCP);  Surgeon: Vida Rigger, MD;  Location: [facility] ENDOSCOPY;  Service: Gastroenterology;  Laterality: N/A;   EYE SURGERY     cataract removal bilaterally   FEMORAL-POPLITEAL BYPASS GRAFT Right 08/16/2020   Procedure: BYPASS GRAFT FEMORAL-POPLITEAL ARTERY;  Surgeon:  Maeola Harman, MD;  Location: [facility] OR;  Service: Vascular;  Laterality: Right;   FEMORAL-POPLITEAL BYPASS GRAFT Left 05/03/2021   Procedure: LEFT FEMORAL TO BELOW KNEE POPLITEAL ARTERY BYPASS GRAFTING WITH 6MMX80 PTFE GRAFT;  Surgeon: Leonie Douglas, MD;  [facility];  Service: Vascular;  Laterality: Left;   LEFT HEART CATH AND CORONARY ANGIOGRAPHY N/A 03/22/2020   Procedure: LEFT HEART CATH AND CORONARY ANGIOGRAPHY;  Surgeon: Swaziland, Peter M, MD;  Location: [facility] CV LAB;  Service: Cardiovascular;  Laterality: N/A;   LOWER EXTREMITY ANGIOGRAPHY Bilateral 06/15/2019   Procedure: Lower Extremity Angiography;  Surgeon: Runell Gess, MD;  Location: [facility] INVASIVE CV LAB;  Service:  Cardiovascular;  Laterality: Bilateral;   LOWER EXTREMITY ANGIOGRAPHY Right 08/03/2019   Procedure: LOWER EXTREMITY ANGIOGRAPHY;  Surgeon: Runell Gess, MD;  [facility];  Service: Cardiovascular;  Laterality: Right;   LOWER EXTREMITY ANGIOGRAPHY N/A 09/07/2021   Procedure: LOWER EXTREMITY ANGIOGRAPHY;  Surgeon: Cephus Shelling, MD;  [facility];  Service: Cardiovascular;  Laterality: N/A;   PATCH ANGIOPLASTY Right 07/21/2019   Procedure: Patch Angioplasty Right Femoral Artery;  Surgeon: Nada Libman, MD;  Location: [facility] OR;  Service: Vascular;  Laterality: Right;   PERIPHERAL INTRAVASCULAR LITHOTRIPSY  11/25/2019   Procedure: INTRAVASCULAR LITHOTRIPSY;  Surgeon: Iran Ouch, MD;  [facility];  Service: Cardiovascular;;  LT. SFA   PERIPHERAL VASCULAR ATHERECTOMY  08/03/2019   Procedure: PERIPHERAL VASCULAR ATHERECTOMY;  Surgeon: Runell Gess, MD;  Location: [facility] INVASIVE CV LAB;  Service: Cardiovascular;;  right SFA, right TP trunk   PERIPHERAL VASCULAR ATHERECTOMY  11/25/2019   Procedure: PERIPHERAL VASCULAR ATHERECTOMY;  Surgeon: Iran Ouch, MD;  [facility];  Service: Cardiovascular;;  Lt.  POPLITEAL and AT   PERIPHERAL VASCULAR BALLOON ANGIOPLASTY Left 06/15/2019   Procedure: PERIPHERAL VASCULAR BALLOON ANGIOPLASTY;  Surgeon: Runell Gess, MD;  [facility];  Service: Cardiovascular;  Laterality: Left;  SFA UNSUCCESSFUL UNABLE TO CROSS LESION   PERIPHERAL VASCULAR BALLOON ANGIOPLASTY  08/03/2019   Procedure: PERIPHERAL VASCULAR BALLOON ANGIOPLASTY;  Surgeon: Runell Gess, MD;  [facility];  Service: Cardiovascular;;  right SFA, Right TP trunk   PERIPHERAL VASCULAR BALLOON ANGIOPLASTY  09/07/2021   Procedure: PERIPHERAL VASCULAR BALLOON ANGIOPLASTY;  Surgeon: Cephus Shelling, MD;  [facility];  Service: Cardiovascular;;   PERIPHERAL  VASCULAR BALLOON ANGIOPLASTY Right 10/13/2021   Procedure: PERIPHERAL VASCULAR BALLOON ANGIOPLASTY;  Surgeon: Leonie Douglas, MD;  [facility];  Service: Cardiovascular;  Laterality: Right;   PERIPHERAL VASCULAR BALLOON ANGIOPLASTY  11/09/2022   Procedure: PERIPHERAL VASCULAR BALLOON ANGIOPLASTY;  Surgeon: Leonie Douglas, MD;  [facility];  Service: Cardiovascular;;  fem-pop bypass and AT   PORTACATH PLACEMENT Left 04/29/2023   Procedure: INSERTION PORT-A-CATH;  Surgeon: Fritzi Mandes, MD;  Location: [facility] ORS;  Service: General;  Laterality: Left;   REMOVAL OF STONES  04/07/2023   Procedure: REMOVAL OF STONES;  Surgeon: Vida Rigger, MD;  Location: [facility] ENDOSCOPY;  Service: Gastroenterology;;   Dennison Mascot  04/07/2023   Procedure: Dennison Mascot;  Surgeon: Vida Rigger, MD;  Location: [facility] ENDOSCOPY;  Service: Gastroenterology;;   TEE WITHOUT CARDIOVERSION N/A 03/24/2020   Procedure: TRANSESOPHAGEAL ECHOCARDIOGRAM (TEE);  Surgeon: Linden Dolin, MD;  Location: [facility] OR;  Service: Open Heart Surgery;  Laterality: N/A;   TONSILLECTOMY     and adenoidectomy  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/2019   Procedure: TRANSMETATARSAL AMPUTATION;  Surgeon: Vivi Barrack, DPM;  Location: [facility] OR;  Service: Podiatry;  Laterality: Right;   TRANSMETATARSAL AMPUTATION Left 05/11/2021   Procedure: LEFT TRANSMETATARSAL AMPUTATION;  Surgeon: Maeola Harman, MD;  Location: [facility] OR;  Service: Vascular;  Laterality: Left;   TUBAL LIGATION       Outpatient Encounter Medications as of 06/13/2023  Medication Sig   aspirin EC 81 MG tablet Take 81 mg by mouth at bedtime. Swallow whole.   atorvastatin (LIPITOR) 80 MG tablet TAKE 1 TABLET BY MOUTH ONCE DAILY. MUST  KEEP  UPCOMING  APPOINTMENT  FOR  FUTURE  REFILLS   brimonidine (ALPHAGAN) 0.2 % ophthalmic solution Place 1 drop into the right eye 2 (two) times daily.   hydrochlorothiazide (HYDRODIURIL) 12.5 MG tablet Take 1  tablet by mouth once daily   Insulin Lispro Prot & Lispro (HUMALOG MIX 75/25 KWIKPEN) (75-25) 100 UNIT/ML Kwikpen INJECT 15 UNITS SUBCUTANEOUSLY ONCE DAILY WITH BREAKFAST (Patient taking differently: Inject 4-8 Units into the skin See admin instructions. Inject 8 units into the skin every morning and 4 units every evening.)   Insulin Pen Needle (ULTRA-THIN II MINI PEN NEEDLE) 31G X 5 MM MISC USE AS DIRECTED   JARDIANCE 25 MG TABS tablet TAKE 1 TABLET BY MOUTH ONCE DAILY BEFORE BREAKFAST   Lancets (ONETOUCH DELICA PLUS LANCET33G) MISC Use to check blood sugars twice daily. (Patient taking differently: Use to check blood sugars  daily.)   lidocaine-prilocaine (EMLA) cream Apply to affected area once   magnesium oxide (MAG-OX) 400 (240 Mg) MG tablet Take 1 tablet (400 mg total) by mouth 2 (two) times daily.   metFORMIN (GLUCOPHAGE) 1000 MG tablet Take 1 tablet by mouth twice daily   metoprolol tartrate (LOPRESSOR) 50 MG tablet Take 1 tablet (50 mg total) by mouth 2 (two) times daily.   Multiple Vitamins-Minerals (MULTI FOR HER PO) Take by mouth.   mupirocin ointment (BACTROBAN) 2 % Apply 1 Application topically 2 (two) times daily. (Patient taking differently: Apply 1 Application topically 2 (two) times daily. As needed)   ondansetron (ZOFRAN) 8 MG tablet Take 1 tablet (8 mg total) by mouth every 8 (eight) hours as needed for nausea or vomiting. Start on the third day after cisplatin.   ONETOUCH VERIO test strip USE TO CHECK BLOOD SUGARS ONCE DAILY   pantoprazole (PROTONIX) 40 MG tablet Take 1 tablet by mouth once daily   prochlorperazine (COMPAZINE) 10 MG tablet Take 1 tablet (10 mg total) by mouth every 6 (six) hours as needed (Nausea or vomiting).   Propylene Glycol (SYSTANE BALANCE) 0.6 % SOLN Place 1 drop into the left eye 3 (three) times daily as needed (dry/irritated eyes).   repaglinide (PRANDIN) 2 MG tablet Take 1 tab by breakfast and 1 tab before dinner (Patient taking differently: Take 2 mg  by mouth daily.)   telmisartan (MICARDIS) 80 MG tablet Take 1 tablet by mouth once daily   XARELTO 2.5 MG TABS tablet Take 1 tablet by mouth twice daily   No facility-administered encounter medications on file as of 06/13/2023.     There were no vitals filed for this visit. There is no height or weight on file to calculate BMI.   PHYSICAL EXAM GENERAL:alert, no distress and comfortable SKIN: no rash  EYES: sclera clear NECK: without mass LYMPH:  no palpable cervical or supraclavicular lymphadenopathy  LUNGS: clear with normal breathing effort HEART: regular rate & rhythm, no lower extremity edema  ABDOMEN: abdomen soft, non-tender and normal bowel sounds NEURO: alert & oriented x 3 with fluent speech, no focal motor/sensory deficits Breast exam:  PAC without erythema    CBC    Component Value Date/Time   WBC 13.9 (H) 05/30/2023 0737   WBC 9.3 04/09/2023 0301   RBC 3.13 (L) 05/30/2023 0737   HGB 9.2 (L) 05/30/2023 0737   HGB 12.2 07/29/2020 0942   HCT 28.3 (L) 05/30/2023 0737   HCT 37.4 07/29/2020 0942   PLT 390 05/30/2023 0737   PLT 304 07/29/2020 0942   MCV 90.4 05/30/2023 0737   MCV 84 07/29/2020 0942   MCH 29.4 05/30/2023 0737   MCHC 32.5 05/30/2023 0737   RDW 16.1 (H) 05/30/2023 0737   RDW 14.8 07/29/2020 0942   LYMPHSABS 1.6 05/30/2023 0737   MONOABS 1.3 (H) 05/30/2023 0737   EOSABS 0.1 05/30/2023 0737   BASOSABS 0.1 05/30/2023 0737     CMP     Component Value Date/Time   NA 140 05/30/2023 0737   NA 142 07/29/2020 0942   K 3.9 05/30/2023 0737   CL 102 05/30/2023 0737   CO2 27 05/30/2023 0737   GLUCOSE 180 (H) 05/30/2023 0737   BUN 25 (H) 05/30/2023 0737   BUN 17 07/29/2020 0942   CREATININE 1.08 (H) 05/30/2023 0737   CREATININE 0.96 10/12/2021 1644   CALCIUM 9.6 05/30/2023 0737   PROT 6.5 05/30/2023 0737   PROT 6.3 07/28/2020 0945   ALBUMIN 3.4 (L) 05/30/2023 0737   ALBUMIN 4.2 07/28/2020 0945   AST 68 (H) 05/30/2023 0737   ALT 54 (H) 05/30/2023  0737   ALKPHOS 281 (H) 05/30/2023 0737   BILITOT 0.8 05/30/2023 0737   GFRNONAA 52 (L) 05/30/2023 0737   GFRAA 75 07/29/2020 0942     ASSESSMENT & PLAN:[name] is a 82 y.o. female with    Gallbladder cancer, cT4N0M0, stage III -Patient had 1 episode of abdominal pain in October 2023, and was found to have a large gallbladder stone. -She underwent laparoscopic attempted cholecystectomy but was not able to be resected due to adhesions to the colon. -She developed jaundice in June 2024, status post ERCP and stent placement in CBD.  CT and MRI scan showed a liver mass adjacent to the gallbladder fossae, and a biopsy confirmed adenocarcinoma.   -The immunostain is c/w upper GI primary.  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  Unfortunately this was felt to be not resectable by Dr. Freida Busman. -CT chest 04/22/23 negative for distant metastasis  -Started first-line cisplatin, gemcitabine  q14 days and durvalumab q28 days, starting 05/03/23     Essential hypertension -Follow-up with PCP  -Continue medication, -Will monitor her blood glucose and blood pressure closely during her chemotherapy     DM2 (diabetes mellitus, type 2)  -Follow-up with PCP and other specialists -Continue medication, -Will monitor her blood glucose and blood pressure closely during her chemotherapy        PLAN:  No orders of the defined types were placed in this encounter.     All questions were answered. The patient knows to call the clinic with any problems, questions or concerns. No barriers to learning were detected. I spent *** counseling the patient face to face. The total time spent in the appointment was *** and more than 50% was on counseling, review of test results, and coordination of care.   Santiago Glad, NP-C @DATE @

## 2023-06-13 ENCOUNTER — Encounter: Payer: Self-pay | Admitting: Nurse Practitioner

## 2023-06-13 ENCOUNTER — Inpatient Hospital Stay (HOSPITAL_BASED_OUTPATIENT_CLINIC_OR_DEPARTMENT_OTHER): Payer: Medicare Other | Admitting: Nurse Practitioner

## 2023-06-13 ENCOUNTER — Other Ambulatory Visit: Payer: Medicare Other

## 2023-06-13 ENCOUNTER — Inpatient Hospital Stay: Payer: Medicare Other

## 2023-06-13 VITALS — BP 127/51 | HR 100 | Temp 97.6°F | Resp 17 | Wt 133.0 lb

## 2023-06-13 DIAGNOSIS — Z5112 Encounter for antineoplastic immunotherapy: Secondary | ICD-10-CM | POA: Diagnosis not present

## 2023-06-13 DIAGNOSIS — Z5111 Encounter for antineoplastic chemotherapy: Secondary | ICD-10-CM | POA: Diagnosis not present

## 2023-06-13 DIAGNOSIS — Z95828 Presence of other vascular implants and grafts: Secondary | ICD-10-CM

## 2023-06-13 DIAGNOSIS — I10 Essential (primary) hypertension: Secondary | ICD-10-CM | POA: Diagnosis not present

## 2023-06-13 DIAGNOSIS — E119 Type 2 diabetes mellitus without complications: Secondary | ICD-10-CM | POA: Diagnosis not present

## 2023-06-13 DIAGNOSIS — C23 Malignant neoplasm of gallbladder: Secondary | ICD-10-CM

## 2023-06-13 DIAGNOSIS — Z7982 Long term (current) use of aspirin: Secondary | ICD-10-CM | POA: Diagnosis not present

## 2023-06-13 LAB — CBC WITH DIFFERENTIAL (CANCER CENTER ONLY)
Abs Immature Granulocytes: 0.06 10*3/uL (ref 0.00–0.07)
Basophils Absolute: 0.1 10*3/uL (ref 0.0–0.1)
Basophils Relative: 1 %
Eosinophils Absolute: 0.2 10*3/uL (ref 0.0–0.5)
Eosinophils Relative: 1 %
HCT: 30.2 % — ABNORMAL LOW (ref 36.0–46.0)
Hemoglobin: 9.5 g/dL — ABNORMAL LOW (ref 12.0–15.0)
Immature Granulocytes: 1 %
Lymphocytes Relative: 12 %
Lymphs Abs: 1.5 10*3/uL (ref 0.7–4.0)
MCH: 29.5 pg (ref 26.0–34.0)
MCHC: 31.5 g/dL (ref 30.0–36.0)
MCV: 93.8 fL (ref 80.0–100.0)
Monocytes Absolute: 1.4 10*3/uL — ABNORMAL HIGH (ref 0.1–1.0)
Monocytes Relative: 11 %
Neutro Abs: 9.7 10*3/uL — ABNORMAL HIGH (ref 1.7–7.7)
Neutrophils Relative %: 74 %
Platelet Count: 397 10*3/uL (ref 150–400)
RBC: 3.22 MIL/uL — ABNORMAL LOW (ref 3.87–5.11)
RDW: 18.8 % — ABNORMAL HIGH (ref 11.5–15.5)
WBC Count: 12.9 10*3/uL — ABNORMAL HIGH (ref 4.0–10.5)
nRBC: 0 % (ref 0.0–0.2)

## 2023-06-13 LAB — CMP (CANCER CENTER ONLY)
ALT: 43 U/L (ref 0–44)
AST: 70 U/L — ABNORMAL HIGH (ref 15–41)
Albumin: 3.5 g/dL (ref 3.5–5.0)
Alkaline Phosphatase: 223 U/L — ABNORMAL HIGH (ref 38–126)
Anion gap: 8 (ref 5–15)
BUN: 29 mg/dL — ABNORMAL HIGH (ref 8–23)
CO2: 27 mmol/L (ref 22–32)
Calcium: 9.8 mg/dL (ref 8.9–10.3)
Chloride: 104 mmol/L (ref 98–111)
Creatinine: 0.96 mg/dL (ref 0.44–1.00)
GFR, Estimated: 59 mL/min — ABNORMAL LOW (ref >60)
Glucose, Bld: 170 mg/dL — ABNORMAL HIGH (ref 70–99)
Potassium: 4.4 mmol/L (ref 3.5–5.1)
Sodium: 139 mmol/L (ref 135–145)
Total Bilirubin: 0.6 mg/dL (ref 0.3–1.2)
Total Protein: 6.8 g/dL (ref 6.5–8.1)

## 2023-06-13 LAB — TSH: TSH: 1.37 u[IU]/mL (ref 0.350–4.500)

## 2023-06-13 LAB — MAGNESIUM: Magnesium: 1.8 mg/dL (ref 1.7–2.4)

## 2023-06-13 MED ORDER — SODIUM CHLORIDE 0.9% FLUSH
10.0000 mL | Freq: Once | INTRAVENOUS | Status: AC
  Administered 2023-06-13: 10 mL

## 2023-06-13 MED ORDER — HEPARIN SOD (PORK) LOCK FLUSH 100 UNIT/ML IV SOLN
500.0000 [IU] | Freq: Once | INTRAVENOUS | Status: AC
  Administered 2023-06-13: 500 [IU]

## 2023-06-13 MED FILL — Fosaprepitant Dimeglumine For IV Infusion 150 MG (Base Eq): INTRAVENOUS | Qty: 5 | Status: AC

## 2023-06-13 NOTE — Assessment & Plan Note (Signed)
cT4N0M0, stage III -Patient had 1 episode of abdominal pain in October 2023, and was found to have a large gallbladder stone. -She underwent laparoscopic attempted cholecystectomy but was not able to be resected due to adhesions to due to them in the colon. -She developed jaundice in June 2024, status post ERCP and stent placement in CBD.  CT and MRI scan showed a liver mass adjacent to the gallbladder fossae, and a biopsy confirmed adenocarcinoma.  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  Unfortunately this was felt to be not resectable by Dr. Freida Busman. -I reviewed her CT chest from April 22, 2023, which showed no evidence of distant metastasis. -I recommended first-line cisplatin, gemcitabine  every 14 days and durvalumab every 28 days.  If she responded well, will stop chemo after 4 to 6 months of treatment, and changed to maintenance therapy. She started on 05/03/23 every 2 weeks and has been tolerating well.  We have also discussed option of consolidation radiation at some point.

## 2023-06-14 ENCOUNTER — Inpatient Hospital Stay: Payer: Medicare Other

## 2023-06-14 VITALS — BP 145/58 | HR 75 | Temp 98.2°F | Resp 17

## 2023-06-14 DIAGNOSIS — Z7982 Long term (current) use of aspirin: Secondary | ICD-10-CM | POA: Diagnosis not present

## 2023-06-14 DIAGNOSIS — C23 Malignant neoplasm of gallbladder: Secondary | ICD-10-CM | POA: Diagnosis not present

## 2023-06-14 DIAGNOSIS — Z5112 Encounter for antineoplastic immunotherapy: Secondary | ICD-10-CM | POA: Diagnosis not present

## 2023-06-14 DIAGNOSIS — Z5111 Encounter for antineoplastic chemotherapy: Secondary | ICD-10-CM | POA: Diagnosis not present

## 2023-06-14 DIAGNOSIS — I10 Essential (primary) hypertension: Secondary | ICD-10-CM | POA: Diagnosis not present

## 2023-06-14 DIAGNOSIS — E119 Type 2 diabetes mellitus without complications: Secondary | ICD-10-CM | POA: Diagnosis not present

## 2023-06-14 LAB — T4: T4, Total: 12.7 ug/dL — ABNORMAL HIGH (ref 4.5–12.0)

## 2023-06-14 MED ORDER — SODIUM CHLORIDE 0.9 % IV SOLN
150.0000 mg | Freq: Once | INTRAVENOUS | Status: AC
  Administered 2023-06-14: 150 mg via INTRAVENOUS
  Filled 2023-06-14: qty 150

## 2023-06-14 MED ORDER — SODIUM CHLORIDE 0.9 % IV SOLN: Freq: Once | INTRAVENOUS | Status: AC

## 2023-06-14 MED ORDER — SODIUM CHLORIDE 0.9% FLUSH
10.0000 mL | INTRAVENOUS | Status: DC | PRN
  Administered 2023-06-14: 10 mL

## 2023-06-14 MED ORDER — HEPARIN SOD (PORK) LOCK FLUSH 100 UNIT/ML IV SOLN
500.0000 [IU] | Freq: Once | INTRAVENOUS | Status: AC | PRN
  Administered 2023-06-14: 500 [IU]

## 2023-06-14 MED ORDER — PALONOSETRON HCL INJECTION 0.25 MG/5ML
0.2500 mg | Freq: Once | INTRAVENOUS | Status: AC
  Administered 2023-06-14: 0.25 mg via INTRAVENOUS
  Filled 2023-06-14: qty 5

## 2023-06-14 MED ORDER — POTASSIUM CHLORIDE IN NACL 20-0.9 MEQ/L-% IV SOLN
Freq: Once | INTRAVENOUS | Status: AC
  Filled 2023-06-14: qty 1000

## 2023-06-14 MED ORDER — SODIUM CHLORIDE 0.9 % IV SOLN
20.0000 mg/m2 | Freq: Once | INTRAVENOUS | Status: AC
  Administered 2023-06-14: 34 mg via INTRAVENOUS
  Filled 2023-06-14: qty 34

## 2023-06-14 MED ORDER — SODIUM CHLORIDE 0.9 % IV SOLN
800.0000 mg/m2 | Freq: Once | INTRAVENOUS | Status: AC
  Administered 2023-06-14: 1369 mg via INTRAVENOUS
  Filled 2023-06-14: qty 36.01

## 2023-06-14 MED ORDER — MAGNESIUM SULFATE 4 GM/100ML IV SOLN
4.0000 g | Freq: Once | INTRAVENOUS | Status: AC
  Administered 2023-06-14: 4 g via INTRAVENOUS
  Filled 2023-06-14: qty 100

## 2023-06-14 NOTE — Patient Instructions (Signed)
[place] [facility]  Discharge Instructions: Thank you for choosing [facility] to provide your oncology and hematology care.   If you have a lab appointment with the Cancer Center, please go directly to the Cancer Center and check in at the registration area.   Wear comfortable clothing and clothing appropriate for easy access to any Portacath or PICC line.   We strive to give you quality time with your provider. You may need to reschedule your appointment if you arrive late (15 or more minutes).  Arriving late affects you and other patients whose appointments are after yours.  Also, if you miss three or more appointments without notifying the office, you may be dismissed from the clinic at the provider's discretion.      For prescription refill requests, have your pharmacy contact our office and allow 72 hours for refills to be completed.    Today you received the following chemotherapy and/or immunotherapy agents: Gemzar and Cisplatin      To help prevent nausea and vomiting after your treatment, we encourage you to take your nausea medication as directed.  BELOW ARE SYMPTOMS THAT SHOULD BE REPORTED IMMEDIATELY: *FEVER GREATER THAN 100.4 F (38 C) OR HIGHER *CHILLS OR SWEATING *NAUSEA AND VOMITING THAT IS NOT CONTROLLED WITH YOUR NAUSEA MEDICATION *UNUSUAL SHORTNESS OF BREATH *UNUSUAL BRUISING OR BLEEDING *URINARY PROBLEMS (pain or burning when urinating, or frequent urination) *BOWEL PROBLEMS (unusual diarrhea, constipation, pain near the anus) TENDERNESS IN MOUTH AND THROAT WITH OR WITHOUT PRESENCE OF ULCERS (sore throat, sores in mouth, or a toothache) UNUSUAL RASH, SWELLING OR PAIN  UNUSUAL VAGINAL DISCHARGE OR ITCHING   Items with * indicate a potential emergency and should be followed up as soon as possible or go to the Emergency Department if any problems should occur.  Please show the CHEMOTHERAPY ALERT CARD or IMMUNOTHERAPY ALERT  CARD at check-in to the Emergency Department and triage nurse.  Should you have questions after your visit or need to cancel or reschedule your appointment, please contact [place] [facility]  Dept: [phone]  and follow the prompts.  Office hours are 8:00 a.m. to 4:30 p.m. Monday - Friday. Please note that voicemails left after 4:00 p.m. may not be returned until the following business day.  We are closed weekends and major holidays. You have access to a nurse at all times for urgent questions. Please call the main number to the clinic Dept: [phone] and follow the prompts.   For any non-urgent questions, you may also contact your provider using MyChart. We now offer e-Visits for anyone 46 and older to request care online for non-urgent symptoms. For details visit [email or web address].   Also download the MyChart app! Go to the app store, search "MyChart", open the app, select [place], and log in with your MyChart username and password.

## 2023-06-15 LAB — CANCER ANTIGEN 19-9: CA 19-9: 3955 U/mL — ABNORMAL HIGH (ref 0–35)

## 2023-06-17 DIAGNOSIS — H401111 Primary open-angle glaucoma, right eye, mild stage: Secondary | ICD-10-CM | POA: Diagnosis not present

## 2023-06-17 DIAGNOSIS — H47011 Ischemic optic neuropathy, right eye: Secondary | ICD-10-CM | POA: Diagnosis not present

## 2023-06-17 DIAGNOSIS — E113493 Type 2 diabetes mellitus with severe nonproliferative diabetic retinopathy without macular edema, bilateral: Secondary | ICD-10-CM | POA: Diagnosis not present

## 2023-06-17 NOTE — Progress Notes (Unsigned)
[facility] [place]  ASSESSMENT / PLAN: 82 y.o. female with complex past vascular history (see below for full detail). Thankfully  Her noninvasive studies remain worrisome. We will plan to monitor her closely. She knows to call should any limb threatening symptoms develop.  I will see her again in 6 months with repeat ABI.  CHIEF COMPLAINT: surveillance of PAD  HISTORY OF PRESENT ILLNESS: [name] is a 82 y.o. female very well-known to me.  She returns to clinic after endovascular intervention in her right lower extremity to salvage her right femoral-popliteal bypass graft.  She tolerated this very well.  She did receive Versed, which he has a poor reaction to in the past.  She required observation overnight.  She was discharged next day without difficulty.  She is getting IV antibiotics.  She is getting hyperbaric oxygen therapy.  These both seem to be helping her foot.  She feels well overall.  02/13/22: Patient returns to clinic for follow-up.  Thankfully both feet are completely healed.  She is due to be seen by the podiatry team for foot orthotics.  She is very happy overall.  10/30/22: Doing well on surveillance. No new symptoms. No ulcers about feet. 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 Has recovered and is back to playing pool.  12/25/22: doing well after angiograms. Walking as much as she likes. Playing (and winning!) pool. No claudication symptoms. No ulcers about the feet. No rest pain.   06/18/23: Recently diagnosed with stage III cholangiocarcinoma. Undergoing palliative chemo +/- rads. In good spirits. No limb threatening symptoms. 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  VASCULAR SURGICAL HISTORY:  07/21/19: Right external iliac, common femoral, profundofemoral, and superficial femoral endarterectomy with bovine pericardial patch angioplasty 08/16/20: Right common femoral endarterectomy with bovine pericardial patch  angioplasty; Right common femoral to above-knee popliteal artery bypass with 6 mm ringed PTFE 04/28/21: LLE angiogram 05/03/21: left common femoral - tibioperoneal trunk bypass with 6mm ringed ePTFE in anatomic tunnel; left third toe open amputation 05/11/21: Open left transmetatarsal amputation 09/07/21: Angioplasty of left lower extremity distal bypass anastomosis and anterior tibial artery (3 mm Sterling) 10/13/21: Right iliofemoral drug-coated angioplasty (6x37mm Ranger); Right popliteal drug-coated angioplasty (4x180mm Ranger); Right tibioperoneal trunk and peroneal angioplasty (3x45mm Sterling)  Past Medical History:  Diagnosis Date   Anemia    after CABG in june 2021   Arthritis    Cancer [facility])    removal from nose - MOSE procedure    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   Complication of anesthesia    VERSED- agitated, muscle spasms, "jerking" , frantic , (never had this occurence in the pas)    Coronary artery disease    Diabetes mellitus without complication (HCC)    Dysrhythmia    PVC's   GERD (gastroesophageal reflux disease)    Heart murmur    History of hiatal hernia    Hyperlipidemia    Hypertension 11/20/2011   ECHO- EF>55% Borderline concentric left ventricular hypertrophy. There is a small calcified mass in the L:A near the LA appendage. No valvular masses seen with associated mitral annular calcification. LA Volume/ BSA27.4 ml/m2 No AS. Right ventricular systolic pressure is elevated at .   Jaundice    Myocardial infarction [facility])    June 2021   Neuromuscular disorder (HCC)    neuropathy in bilateral feet   Peripheral vascular disease (HCC)    Pneumonia    not hosp.    S/P angioplasty with stent  Lt SFA of prox segment.  PTCAs with drug coated balloon Lt ant tibial artery and Lt popliteal artery  11/26/2019    Past Surgical History:  Procedure Laterality Date   ABDOMINAL AORTAGRAM  11/25/2019   ABDOMINAL AORTOGRAM W/LOWER EXTREMITY    ABDOMINAL  AORTOGRAM N/A 06/15/2019   Procedure: ABDOMINAL AORTOGRAM;  Surgeon: Runell Gess, MD;  [facility];  Service: Cardiovascular;  Laterality: N/A;   ABDOMINAL AORTOGRAM W/LOWER EXTREMITY N/A 11/25/2019   Procedure: ABDOMINAL AORTOGRAM W/LOWER EXTREMITY;  Surgeon: Iran Ouch, MD;  [facility];  Service: Cardiovascular;  Laterality: N/A;  Lt leg   ABDOMINAL AORTOGRAM W/LOWER EXTREMITY N/A 08/01/2020   Procedure: ABDOMINAL AORTOGRAM W/LOWER EXTREMITY;  Surgeon: Runell Gess, MD;  [facility];  Service: Cardiovascular;  Laterality: N/A;   ABDOMINAL AORTOGRAM W/LOWER EXTREMITY N/A 04/28/2021   Procedure: ABDOMINAL AORTOGRAM W/LOWER EXTREMITY;  Surgeon: Sherren Kerns, MD;  [facility];  Service: Cardiovascular;  Laterality: N/A;   ABDOMINAL AORTOGRAM W/LOWER EXTREMITY N/A 10/13/2021   Procedure: ABDOMINAL AORTOGRAM W/LOWER EXTREMITY;  Surgeon: Leonie Douglas, MD;  [facility];  Service: Cardiovascular;  Laterality: N/A;   ABDOMINAL AORTOGRAM W/LOWER EXTREMITY N/A 11/09/2022   Procedure: ABDOMINAL AORTOGRAM W/LOWER EXTREMITY;  Surgeon: Leonie Douglas, MD;  [facility];  Service: Cardiovascular;  Laterality: N/A;   ABDOMINAL AORTOGRAM W/LOWER EXTREMITY N/A 11/16/2022   Procedure: ABDOMINAL AORTOGRAM W/LOWER EXTREMITY;  Surgeon: Leonie Douglas, MD;  [facility];  Service: Cardiovascular;  Laterality: N/A;   AMPUTATION TOE Left 05/03/2021   Procedure: AMPUTATION OF THIRD LEFT  TOE;  Surgeon: Leonie Douglas, MD;  Location: [facility] OR;  Service: Vascular;  Laterality: Left;   APPENDECTOMY     APPLICATION OF WOUND VAC Right 07/21/2019   Procedure: Application Of Prevena Wound Vac Right Groin;  Surgeon: Nada Libman, MD;  Location: [facility] OR;  Service: Vascular;  Laterality: Right;   BILIARY BRUSHING  04/07/2023   Procedure: BILIARY BRUSHING;  Surgeon: Vida Rigger, MD;   Location: [facility] ENDOSCOPY;  Service: Gastroenterology;;   BILIARY STENT PLACEMENT  04/07/2023   Procedure: BILIARY STENT PLACEMENT;  Surgeon: Vida Rigger, MD;  Location: [facility] ENDOSCOPY;  Service: Gastroenterology;;   CARPAL TUNNEL RELEASE Left    CARPAL TUNNEL RELEASE Right    CESAREAN SECTION     x 2   CHOLECYSTECTOMY N/A 10/12/2022   Procedure: LAPAROSCOPIC PARTIAL FENESTRATING CHOLECYSTECTOMY;  Surgeon: Fritzi Mandes, MD;  [facility];  Service: General;  Laterality: N/A;   COLONOSCOPY     CORONARY ARTERY BYPASS GRAFT N/A 03/24/2020   Procedure: CORONARY ARTERY BYPASS GRAFTING (CABG) using LIMA to LAD (m); RIMA to RAMUS; Endoscopic Right Greater Saphenous Vein: SVG to Diag1; SVG to PLB (right); and SVG to PL (left).;  Surgeon: Linden Dolin, MD;  [facility];  Service: Open Heart Surgery;  Laterality: N/A;  BILATERAL IMA   ENDARTERECTOMY FEMORAL Right 07/21/2019   Procedure: RIGHT ENDARTERECTOMY FEMORAL WITH PATCH ANGIOPLASTY;  Surgeon: Nada Libman, MD;  [facility];  Service: Vascular;  Laterality: Right;   ENDARTERECTOMY FEMORAL Right 08/16/2020   Procedure: RIGHT FEMORAL ENDARTERECTOMY;  Surgeon: Maeola Harman, MD;  Location: [facility] OR;  Service: Vascular;  Laterality: Right;   ENDOVEIN HARVEST OF GREATER SAPHENOUS VEIN Right 03/24/2020   Procedure: ENDOVEIN HARVEST OF GREATER SAPHENOUS VEIN;  Surgeon: Linden Dolin, MD;  Location: [facility]  OR;  Service: Open Heart Surgery;  Laterality: Right;   ERCP N/A 04/07/2023   Procedure: ENDOSCOPIC RETROGRADE CHOLANGIOPANCREATOGRAPHY (ERCP);  Surgeon: Vida Rigger, MD;  Location: [facility] ENDOSCOPY;  Service: Gastroenterology;  Laterality: N/A;   EYE SURGERY     cataract removal bilaterally   FEMORAL-POPLITEAL BYPASS GRAFT Right 08/16/2020   Procedure: BYPASS GRAFT FEMORAL-POPLITEAL ARTERY;  Surgeon: Maeola Harman, MD;  Location: [facility] OR;  Service: Vascular;  Laterality: Right;   FEMORAL-POPLITEAL BYPASS GRAFT Left  05/03/2021   Procedure: LEFT FEMORAL TO BELOW KNEE POPLITEAL ARTERY BYPASS GRAFTING WITH 6MMX80 PTFE GRAFT;  Surgeon: Leonie Douglas, MD;  [facility];  Service: Vascular;  Laterality: Left;   LEFT HEART CATH AND CORONARY ANGIOGRAPHY N/A 03/22/2020   Procedure: LEFT HEART CATH AND CORONARY ANGIOGRAPHY;  Surgeon: Swaziland, Peter M, MD;  Location: [facility] INVASIVE CV LAB;  Service: Cardiovascular;  Laterality: N/A;   LOWER EXTREMITY ANGIOGRAPHY Bilateral 06/15/2019   Procedure: Lower Extremity Angiography;  Surgeon: Runell Gess, MD;  Location: [facility] INVASIVE CV LAB;  Service: Cardiovascular;  Laterality: Bilateral;   LOWER EXTREMITY ANGIOGRAPHY Right 08/03/2019   Procedure: LOWER EXTREMITY ANGIOGRAPHY;  Surgeon: Runell Gess, MD;  [facility];  Service: Cardiovascular;  Laterality: Right;   LOWER EXTREMITY ANGIOGRAPHY N/A 09/07/2021   Procedure: LOWER EXTREMITY ANGIOGRAPHY;  Surgeon: Cephus Shelling, MD;  [facility];  Service: Cardiovascular;  Laterality: N/A;   PATCH ANGIOPLASTY Right 07/21/2019   Procedure: Patch Angioplasty Right Femoral Artery;  Surgeon: Nada Libman, MD;  Location: [facility] OR;  Service: Vascular;  Laterality: Right;   PERIPHERAL INTRAVASCULAR LITHOTRIPSY  11/25/2019   Procedure: INTRAVASCULAR LITHOTRIPSY;  Surgeon: Iran Ouch, MD;  [facility];  Service: Cardiovascular;;  LT. SFA   PERIPHERAL VASCULAR ATHERECTOMY  08/03/2019   Procedure: PERIPHERAL VASCULAR ATHERECTOMY;  Surgeon: Runell Gess, MD;  Location: [facility] INVASIVE CV LAB;  Service: Cardiovascular;;  right SFA, right TP trunk   PERIPHERAL VASCULAR ATHERECTOMY  11/25/2019   Procedure: PERIPHERAL VASCULAR ATHERECTOMY;  Surgeon: Iran Ouch, MD;  [facility];  Service: Cardiovascular;;  Lt.  POPLITEAL and AT   PERIPHERAL VASCULAR BALLOON ANGIOPLASTY Left 06/15/2019   Procedure: PERIPHERAL VASCULAR BALLOON ANGIOPLASTY;  Surgeon:  Runell Gess, MD;  [facility];  Service: Cardiovascular;  Laterality: Left;  SFA UNSUCCESSFUL UNABLE TO CROSS LESION   PERIPHERAL VASCULAR BALLOON ANGIOPLASTY  08/03/2019   Procedure: PERIPHERAL VASCULAR BALLOON ANGIOPLASTY;  Surgeon: Runell Gess, MD;  [facility];  Service: Cardiovascular;;  right SFA, Right TP trunk   PERIPHERAL VASCULAR BALLOON ANGIOPLASTY  09/07/2021   Procedure: PERIPHERAL VASCULAR BALLOON ANGIOPLASTY;  Surgeon: Cephus Shelling, MD;  [facility];  Service: Cardiovascular;;   PERIPHERAL VASCULAR BALLOON ANGIOPLASTY Right 10/13/2021   Procedure: PERIPHERAL VASCULAR BALLOON ANGIOPLASTY;  Surgeon: Leonie Douglas, MD;  [facility];  Service: Cardiovascular;  Laterality: Right;   PERIPHERAL VASCULAR BALLOON ANGIOPLASTY  11/09/2022   Procedure: PERIPHERAL VASCULAR BALLOON ANGIOPLASTY;  Surgeon: Leonie Douglas, MD;  [facility];  Service: Cardiovascular;;  fem-pop bypass and AT   PORTACATH PLACEMENT Left 04/29/2023   Procedure: INSERTION PORT-A-CATH;  Surgeon: Fritzi Mandes, MD;  Location: [facility] ORS;  Service: General;  Laterality: Left;   REMOVAL OF STONES  04/07/2023   Procedure: REMOVAL OF STONES;  Surgeon: Vida Rigger, MD;  [facility];  Service: Gastroenterology;;   Dennison Mascot  04/07/2023   Procedure: Dennison Mascot;  Surgeon: Vida Rigger, MD;  Location: [facility] ENDOSCOPY;  Service: Gastroenterology;;   TEE WITHOUT CARDIOVERSION N/A 03/24/2020   Procedure: TRANSESOPHAGEAL ECHOCARDIOGRAM (TEE);  Surgeon: Linden Dolin, MD;  Location: [facility] OR;  Service: Open Heart Surgery;  Laterality: N/A;   TONSILLECTOMY     and adenoidectomy   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/2019   Procedure: TRANSMETATARSAL AMPUTATION;  Surgeon: Vivi Barrack, DPM;  Location[facility];  Service: Podiatry;  Laterality: Right;   TRANSMETATARSAL AMPUTATION Left 05/11/2021   Procedure: LEFT  TRANSMETATARSAL AMPUTATION;  Surgeon: Maeola Harman, MD;  Location: [facility] OR;  Service: Vascular;  Laterality: Left;   TUBAL LIGATION      Family History  Problem Relation Age of Onset   Cancer - Prostate Father    Cancer - Colon Father    Stroke Mother    Hypertension Mother    Hyperlipidemia Mother    Melanoma Brother     Social History   Socioeconomic History   Marital status: Widowed    Spouse name: Not on file   Number of children: 3   Years of education: Not on file   Highest education level: Not on file  Occupational History   Not on file  Tobacco Use   Smoking status: Former    Current packs/day: 0.00    Types: Cigarettes    Quit date: 03/31/1972    Years since quitting: 51.2   Smokeless tobacco: Never  Vaping Use   Vaping status: Never Used  Substance and Sexual Activity   Alcohol use: No   Drug use: No   Sexual activity: Not on file  Other Topics Concern   Not on file  Social History Narrative   Not on file   Social Determinants of Health   Financial Resource Strain: Not on file  Food Insecurity: No Food Insecurity (04/05/2023)   Hunger Vital Sign    Worried About Running Out of Food in the Last Year: Never true    Ran Out of Food in the Last Year: Never true  Transportation Needs: No Transportation Needs (04/05/2023)   PRAPARE - Administrator, Civil Service (Medical): No    Lack of Transportation (Non-Medical): No  Physical Activity: Not on file  Stress: Not on file  Social Connections: Unknown (02/02/2022)   [facility]   Social Network    Social Network: Not on file  Intimate Partner Violence: Not At Risk (04/05/2023)   Humiliation, Afraid, Rape, and Kick questionnaire    Fear of Current or Ex-Partner: No    Emotionally Abused: No    Physically Abused: No    Sexually Abused: No    Allergies  Allergen Reactions   Versed [Midazolam] Anxiety    Frantic, out of my mind, agitated    Augmentin  [Amoxicillin-Pot Clavulanate] Other (See Comments)    Elevates potassium level   Bactrim [Sulfamethoxazole-Trimethoprim] Other (See Comments)    ^ K+( elevated)    Demerol [Meperidine]     Delusional    Meperidine Hcl     Other reaction(s): delusional   Scopolamine     Delusional    Tramadol Other (See Comments)    Keeps awake    Current Outpatient Medications  Medication Sig Dispense Refill   aspirin EC 81 MG tablet Take 81 mg by mouth at bedtime. Swallow whole.     atorvastatin (LIPITOR) 80 MG tablet TAKE 1 TABLET  BY MOUTH ONCE DAILY. MUST  KEEP  UPCOMING  APPOINTMENT  FOR  FUTURE  REFILLS 90 tablet 0   brimonidine (ALPHAGAN) 0.2 % ophthalmic solution Place 1 drop into the right eye 2 (two) times daily.     hydrochlorothiazide (HYDRODIURIL) 12.5 MG tablet Take 1 tablet by mouth once daily 90 tablet 0   Insulin Lispro Prot & Lispro (HUMALOG MIX 75/25 KWIKPEN) (75-25) 100 UNIT/ML Kwikpen INJECT 15 UNITS SUBCUTANEOUSLY ONCE DAILY WITH BREAKFAST (Patient taking differently: Inject 4-8 Units into the skin See admin instructions. Inject 8 units into the skin every morning and 4 units every evening.) 15 mL 0   Insulin Pen Needle (ULTRA-THIN II MINI PEN NEEDLE) 31G X 5 MM MISC USE AS DIRECTED 50 each 2   JARDIANCE 25 MG TABS tablet TAKE 1 TABLET BY MOUTH ONCE DAILY BEFORE BREAKFAST 90 tablet 3   Lancets (ONETOUCH DELICA PLUS LANCET33G) MISC Use to check blood sugars twice daily. (Patient taking differently: Use to check blood sugars  daily.) 100 each 5   lidocaine-prilocaine (EMLA) cream Apply to affected area once 30 g 3   magnesium oxide (MAG-OX) 400 (240 Mg) MG tablet Take 1 tablet (400 mg total) by mouth 2 (two) times daily. 60 tablet 1   metFORMIN (GLUCOPHAGE) 1000 MG tablet Take 1 tablet by mouth twice daily 180 tablet 0   metoprolol tartrate (LOPRESSOR) 50 MG tablet Take 1 tablet (50 mg total) by mouth 2 (two) times daily. 180 tablet 0   Multiple Vitamins-Minerals (MULTI FOR HER PO) Take  by mouth.     mupirocin ointment (BACTROBAN) 2 % Apply 1 Application topically 2 (two) times daily. (Patient taking differently: Apply 1 Application topically 2 (two) times daily. As needed) 30 g 2   ondansetron (ZOFRAN) 8 MG tablet Take 1 tablet (8 mg total) by mouth every 8 (eight) hours as needed for nausea or vomiting. Start on the third day after cisplatin. 30 tablet 1   ONETOUCH VERIO test strip USE TO CHECK BLOOD SUGARS ONCE DAILY 100 each 2   pantoprazole (PROTONIX) 40 MG tablet Take 1 tablet by mouth once daily 90 tablet 3   prochlorperazine (COMPAZINE) 10 MG tablet Take 1 tablet (10 mg total) by mouth every 6 (six) hours as needed (Nausea or vomiting). 30 tablet 1   Propylene Glycol (SYSTANE BALANCE) 0.6 % SOLN Place 1 drop into the left eye 3 (three) times daily as needed (dry/irritated eyes).     repaglinide (PRANDIN) 2 MG tablet Take 1 tab by breakfast and 1 tab before dinner (Patient taking differently: Take 2 mg by mouth daily.) 180 tablet 2   telmisartan (MICARDIS) 80 MG tablet Take 1 tablet by mouth once daily 90 tablet 3   XARELTO 2.5 MG TABS tablet Take 1 tablet by mouth twice daily 60 tablet 0   No current facility-administered medications for this visit.    PHYSICAL EXAM Vitals:   06/18/23 0832  BP: (!) 188/70  Pulse: 81  Resp: 20  Temp: 97.7 F (36.5 C)  SpO2: 98%  Weight: 136 lb (61.7 kg)  Height: 5\' 5"  (1.651 m)    Well-appearing elderly woman in no distress Regular rate and rhythm Unlabored breathing Bilateral TMA's without ulceration No palpable pedal pulses Feet are warm and well-perfused   PERTINENT LABORATORY AND RADIOLOGIC DATA  Most recent CBC    Latest Ref Rng & Units 06/13/2023    8:39 AM 05/30/2023    7:37 AM 05/16/2023    8:45 AM  CBC  WBC 4.0 - 10.5 K/uL 12.9  13.9  12.9   Hemoglobin 12.0 - 15.0 g/dL 9.5  9.2  9.6   Hematocrit 36.0 - 46.0 % 30.2  28.3  29.3   Platelets 150 - 400 K/uL 397  390  370      Most recent CMP    Latest Ref  Rng & Units 06/13/2023    8:39 AM 05/30/2023    7:37 AM 05/16/2023    8:45 AM  CMP  Glucose 70 - 99 mg/dL 161  096  045   BUN 8 - 23 mg/dL 29  25  26    Creatinine 0.44 - 1.00 mg/dL 4.09  8.11  9.14   Sodium 135 - 145 mmol/L 139  140  135   Potassium 3.5 - 5.1 mmol/L 4.4  3.9  4.1   Chloride 98 - 111 mmol/L 104  102  98   CO2 22 - 32 mmol/L 27  27  24    Calcium 8.9 - 10.3 mg/dL 9.8  9.6  9.3   Total Protein 6.5 - 8.1 g/dL 6.8  6.5  6.9   Total Bilirubin 0.3 - 1.2 mg/dL 0.6  0.8  0.9   Alkaline Phos 38 - 126 U/L 223  281  265   AST 15 - 41 U/L 70  68  42   ALT 0 - 44 U/L 43  54  31     Renal function Estimated Creatinine Clearance: 41.4 mL/min (by C-G formula based on SCr of 0.96 mg/dL).  Hgb A1c MFr Bld (%)  Date Value  03/11/2023 7.9 (H)    LDL (calc)  Date Value Ref Range Status  04/06/2013 87 <100 mg/dL Final    Comment:    LDL-C is inaccurate if patient is nonfasting.   Reference Range: ---------------- Optimal:            <100 Near/Above Optimal: 100-129 Borderline High:    130-159 High:               160-189 Very High:          >=190       LDL Chol Calc (NIH)  Date Value Ref Range Status  07/28/2020 54 0 - 99 mg/dL Final   LDL Cholesterol  Date Value Ref Range Status  10/14/2021 50 0 - 99 mg/dL Final    Comment:           Total Cholesterol/HDL:CHD Risk Coronary Heart Disease Risk Table                     Men   Women  1/2 Average Risk   3.4   3.3  Average Risk       5.0   4.4  2 X Average Risk   9.6   7.1  3 X Average Risk  23.4   11.0        Use the calculated Patient Ratio above and the CHD Risk Table to determine the patient's CHD Risk.        ATP III CLASSIFICATION (LDL):  <100     mg/dL   Optimal  782-956  mg/dL   Near or Above                    Optimal  130-159  mg/dL   Borderline  213-086  mg/dL   High  >578     mg/dL   Very High Performed at [facility], [address] [place], [place] [address]      +-------+-----------+-----------+------------+------------+  ABI/TBIToday's ABIToday's TBIPrevious ABIPrevious TBI  +-------+-----------+-----------+------------+------------+  Right 0.68       amp        0.36        amp           +-------+-----------+-----------+------------+------------+  Left  0.78       amp        0.42        amp           +-------+-----------+-----------+------------+------------+      Rande Brunt. Lenell Antu, MD FACS [facility] Phone Number: [phone] 06/18/2023 9:19 AM  Total time spent on preparing this encounter including chart review, data review, collecting history, examining the patient, coordinating care for this established patient, 30 minutes.  Portions of this report may have been transcribed using voice recognition software.  Every effort has been made to ensure accuracy; however, inadvertent computerized transcription errors may still be present.

## 2023-06-18 ENCOUNTER — Encounter: Payer: Self-pay | Admitting: Vascular Surgery

## 2023-06-18 ENCOUNTER — Ambulatory Visit (HOSPITAL_COMMUNITY)
Admission: RE | Admit: 2023-06-18 | Discharge: 2023-06-18 | Disposition: A | Discharge status: Home / Self Care | Payer: Medicare Other | Source: Ambulatory Visit | Attending: Vascular Surgery | Admitting: Vascular Surgery

## 2023-06-18 ENCOUNTER — Ambulatory Visit: Payer: Medicare Other | Admitting: Endocrinology

## 2023-06-18 ENCOUNTER — Ambulatory Visit (INDEPENDENT_AMBULATORY_CARE_PROVIDER_SITE_OTHER): Payer: Medicare Other | Admitting: Vascular Surgery

## 2023-06-18 VITALS — BP 188/70 | HR 81 | Temp 97.7°F | Resp 20 | Ht 65.0 in | Wt 136.0 lb

## 2023-06-18 DIAGNOSIS — I739 Peripheral vascular disease, unspecified: Secondary | ICD-10-CM | POA: Diagnosis not present

## 2023-06-18 LAB — VAS US ABI WITH/WO TBI
Left ABI: 0.78
Right ABI: 0.68

## 2023-06-18 NOTE — Progress Notes (Signed)
Fax received from [facility] on 04/25/23 for medical clearance/medication hold for port-a-cath insertion to be signed by T. Lenell Antu, MD.  Provider signed on 06/17/23, form faxed back to sender on 06/17/23, verified successful, scanned into pt's chart.

## 2023-06-20 ENCOUNTER — Other Ambulatory Visit: Payer: Self-pay | Admitting: Vascular Surgery

## 2023-06-20 ENCOUNTER — Other Ambulatory Visit: Payer: Self-pay | Admitting: Cardiovascular Disease

## 2023-06-21 ENCOUNTER — Other Ambulatory Visit (INDEPENDENT_AMBULATORY_CARE_PROVIDER_SITE_OTHER): Payer: Medicare Other

## 2023-06-21 DIAGNOSIS — E1165 Type 2 diabetes mellitus with hyperglycemia: Secondary | ICD-10-CM | POA: Diagnosis not present

## 2023-06-21 DIAGNOSIS — Z794 Long term (current) use of insulin: Secondary | ICD-10-CM

## 2023-06-21 LAB — HEMOGLOBIN A1C: Hgb A1c MFr Bld: 7.2 % — ABNORMAL HIGH (ref 4.6–6.5)

## 2023-06-21 LAB — BASIC METABOLIC PANEL
BUN: 22 mg/dL (ref 6–23)
CO2: 29 meq/L (ref 19–32)
Calcium: 10.4 mg/dL (ref 8.4–10.5)
Chloride: 101 meq/L (ref 96–112)
Creatinine, Ser: 0.83 mg/dL (ref 0.40–1.20)
GFR: 66.19 mL/min (ref >60.00)
Glucose, Bld: 163 mg/dL — ABNORMAL HIGH (ref 70–99)
Potassium: 5.1 meq/L (ref 3.5–5.1)
Sodium: 141 meq/L (ref 135–145)

## 2023-06-25 ENCOUNTER — Encounter: Payer: Self-pay | Admitting: Endocrinology

## 2023-06-25 ENCOUNTER — Ambulatory Visit (INDEPENDENT_AMBULATORY_CARE_PROVIDER_SITE_OTHER): Payer: Medicare Other | Admitting: Endocrinology

## 2023-06-25 ENCOUNTER — Ambulatory Visit: Payer: Medicare Other | Admitting: Podiatry

## 2023-06-25 VITALS — BP 123/60 | HR 86 | Ht 65.5 in | Wt 132.6 lb

## 2023-06-25 DIAGNOSIS — E1129 Type 2 diabetes mellitus with other diabetic kidney complication: Secondary | ICD-10-CM

## 2023-06-25 DIAGNOSIS — E1165 Type 2 diabetes mellitus with hyperglycemia: Secondary | ICD-10-CM

## 2023-06-25 DIAGNOSIS — R809 Proteinuria, unspecified: Secondary | ICD-10-CM

## 2023-06-25 DIAGNOSIS — E11319 Type 2 diabetes mellitus with unspecified diabetic retinopathy without macular edema: Secondary | ICD-10-CM | POA: Diagnosis not present

## 2023-06-25 DIAGNOSIS — Z794 Long term (current) use of insulin: Secondary | ICD-10-CM | POA: Diagnosis not present

## 2023-06-25 MED ORDER — INSULIN LISPRO PROT & LISPRO (75-25 MIX) 100 UNIT/ML KWIKPEN: PEN_INJECTOR | SUBCUTANEOUS | 4 refills | Status: DC

## 2023-06-25 NOTE — Progress Notes (Unsigned)
Patient Care Team: Shon Hale, MD as PCP - General (Family Medicine) Runell Gess, MD as PCP - Cardiology (Cardiology) Malachy Mood, MD as Consulting Physician (Oncology)   CHIEF COMPLAINT: Follow up gallbladder cancer   Oncology History Overview Note   Cancer Staging  Gallbladder cancer [facility]) Staging form: Distal Bile Duct, AJCC 8th Edition - Clinical stage from 04/09/2023: Stage IIIB (cT4, cN0, cM0) - Signed by Malachy Mood, MD on 04/18/2023 Total positive nodes: 0 Histologic grade (G): G2 Histologic grading system: 3 grade system     Gallbladder cancer (HCC)  04/04/2023 Imaging    IMPRESSION: 1. Ill-defined hypodensity in the region of the gallbladder fossa appears similar to the prior study given lack of contrast. This would be better evaluated with dedicated MRI. 2. Nonobstructing right renal calculus. 3. Large amount of stool throughout the colon. 4. Stable indeterminate lucent areas in the vertebral bodies of the lumbar spine. 5. Severe atherosclerotic disease.     04/09/2023 Pathology Results     FINAL MICROSCOPIC DIAGNOSIS:   A. LIVER, ADJACENT TO GALLBLADDER FOSSA, BIOPSY:  - Moderately differentiated adenocarcinoma    04/09/2023 Cancer Staging   Staging form: Distal Bile Duct, AJCC 8th Edition - Clinical stage from 04/09/2023: Stage IIIB (cT4, cN0, cM0) - Signed by Malachy Mood, MD on 04/18/2023 Total positive nodes: 0 Histologic grade (G): G2 Histologic grading system: 3 grade system   04/18/2023 Initial Diagnosis   Cholangiocarcinoma of liver (HCC)   05/03/2023 -  Chemotherapy   Patient is on Treatment Plan : BILIARY TRACT Cisplatin + Gemcitabine D1,15 + Durvalumab (1500) D1 q28d / Durvalumab (1500) q28d        CURRENT THERAPY: cisplatin and gemcitabine every 14 days with durvalumab every 28 days, starting 05/03/2023    INTERVAL HISTORY [name] returns for follow up as scheduled. Last seen by me 06/13/23 and proceeded with C2 D15 cis/gem on  9/20.  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  She has felt the best with this past round.  She eats frequently so she does not overeat.  Diarrhea resolved, drinking adequately.  Pre-existing neuropathy is stable.  Denies pain, fever, chills, cough, chest pain, dyspnea, leg edema, rash.  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 There is a small calcified mass in the L:A near the LA appendage. No valvular masses seen with associated mitral annular calcification. LA Volume/ BSA27.4 ml/m2 No AS. Right ventricular systolic pressure is elevated at .   Jaundice    Myocardial infarction [facility])    June 2021   Neuromuscular disorder (HCC)    neuropathy in bilateral feet   Peripheral vascular disease (HCC)    Pneumonia    not hosp.    S/P  angioplasty with stent Lt SFA of prox segment.  PTCAs with drug coated balloon Lt ant tibial artery and Lt popliteal artery  11/26/2019     Past Surgical History:  Procedure Laterality Date   ABDOMINAL AORTAGRAM  11/25/2019   ABDOMINAL AORTOGRAM W/LOWER EXTREMITY    ABDOMINAL AORTOGRAM N/A 06/15/2019   Procedure: ABDOMINAL AORTOGRAM;  Surgeon: Runell Gess, MD;  [facility];  Service: Cardiovascular;  Laterality: N/A;   ABDOMINAL AORTOGRAM W/LOWER EXTREMITY N/A 11/25/2019    Procedure: ABDOMINAL AORTOGRAM W/LOWER EXTREMITY;  Surgeon: Iran Ouch, MD;  [facility]: [facility];  Service: Cardiovascular;  Laterality: N/A;  Lt leg   ABDOMINAL AORTOGRAM W/LOWER EXTREMITY N/A 08/01/2020   Procedure: ABDOMINAL AORTOGRAM W/LOWER EXTREMITY;  Surgeon: Runell Gess, MD;  [facility];  Service: Cardiovascular;  Laterality: N/A;   ABDOMINAL AORTOGRAM W/LOWER EXTREMITY N/A 04/28/2021   Procedure: ABDOMINAL AORTOGRAM W/LOWER EXTREMITY;  Surgeon: Sherren Kerns, MD;  [facility];  Service: Cardiovascular;  Laterality: N/A;   ABDOMINAL AORTOGRAM W/LOWER EXTREMITY N/A 10/13/2021   Procedure: ABDOMINAL AORTOGRAM W/LOWER EXTREMITY;  Surgeon: Leonie Douglas, MD;  [facility];  Service: Cardiovascular;  Laterality: N/A;   ABDOMINAL AORTOGRAM W/LOWER EXTREMITY N/A 11/09/2022   Procedure: ABDOMINAL AORTOGRAM W/LOWER EXTREMITY;  Surgeon: Leonie Douglas, MD;  [facility];  Service: Cardiovascular;  Laterality: N/A;   ABDOMINAL AORTOGRAM W/LOWER EXTREMITY N/A 11/16/2022   Procedure: ABDOMINAL AORTOGRAM W/LOWER EXTREMITY;  Surgeon: Leonie Douglas, MD;  [facility];  Service: Cardiovascular;  Laterality: N/A;   AMPUTATION TOE Left 05/03/2021   Procedure: AMPUTATION OF THIRD LEFT  TOE;  Surgeon: Leonie Douglas, MD;  Location: [facility] OR;  Service: Vascular;  Laterality: Left;   APPENDECTOMY     APPLICATION OF WOUND VAC Right 07/21/2019   Procedure: Application Of Prevena Wound Vac Right Groin;  Surgeon: Nada Libman, MD;  Location: [facility] OR;  Service: Vascular;  Laterality: Right;   BILIARY BRUSHING  04/07/2023   Procedure: BILIARY BRUSHING;  Surgeon: Vida Rigger, MD;  Location: [facility] ENDOSCOPY;  Service: Gastroenterology;;   BILIARY STENT PLACEMENT  04/07/2023   Procedure: BILIARY STENT PLACEMENT;  Surgeon: Vida Rigger, MD;  Location: [facility] ENDOSCOPY;  Service: Gastroenterology;;   CARPAL TUNNEL  RELEASE Left    CARPAL TUNNEL RELEASE Right    CESAREAN SECTION     x 2   CHOLECYSTECTOMY N/A 10/12/2022   Procedure: LAPAROSCOPIC PARTIAL FENESTRATING CHOLECYSTECTOMY;  Surgeon: Fritzi Mandes, MD;  [facility];  Service: General;  Laterality: N/A;   COLONOSCOPY     CORONARY ARTERY BYPASS GRAFT N/A 03/24/2020   Procedure: CORONARY ARTERY BYPASS GRAFTING (CABG) using LIMA to LAD (m); RIMA to RAMUS; Endoscopic Right Greater Saphenous Vein: SVG to Diag1; SVG to PLB (right); and SVG to PL (left).;  Surgeon: Linden Dolin, MD;  [facility];  Service: Open Heart Surgery;  Laterality: N/A;  BILATERAL IMA   ENDARTERECTOMY FEMORAL Right 07/21/2019   Procedure: RIGHT ENDARTERECTOMY FEMORAL WITH PATCH ANGIOPLASTY;  Surgeon: Nada Libman, MD;  [facility];  Service: Vascular;  Laterality: Right;   ENDARTERECTOMY FEMORAL Right 08/16/2020   Procedure: RIGHT FEMORAL ENDARTERECTOMY;  Surgeon: Maeola Harman, MD;  Location: [facility] OR;  Service: Vascular;  Laterality: Right;   ENDOVEIN HARVEST OF GREATER SAPHENOUS VEIN Right 03/24/2020   Procedure: ENDOVEIN HARVEST OF GREATER SAPHENOUS VEIN;  Surgeon: Linden Dolin,  MD;  [facility];  Service: Open Heart Surgery;  Laterality: Right;   ERCP N/A 04/07/2023   Procedure: ENDOSCOPIC RETROGRADE CHOLANGIOPANCREATOGRAPHY (ERCP);  Surgeon: Vida Rigger, MD;  Location: [facility] ENDOSCOPY;  Service: Gastroenterology;  Laterality: N/A;   EYE SURGERY     cataract removal bilaterally   FEMORAL-POPLITEAL BYPASS GRAFT Right 08/16/2020   Procedure: BYPASS GRAFT FEMORAL-POPLITEAL ARTERY;  Surgeon: Maeola Harman, MD;  Location: [facility] OR;  Service: Vascular;  Laterality: Right;   FEMORAL-POPLITEAL BYPASS GRAFT Left 05/03/2021   Procedure: LEFT FEMORAL TO BELOW KNEE POPLITEAL ARTERY BYPASS GRAFTING WITH 6MMX80 PTFE GRAFT;  Surgeon: Leonie Douglas, MD;  [facility];  Service: Vascular;  Laterality: Left;   LEFT HEART CATH AND CORONARY  ANGIOGRAPHY N/A 03/22/2020   Procedure: LEFT HEART CATH AND CORONARY ANGIOGRAPHY;  Surgeon: Swaziland, Peter M, MD;  Location: [facility] INVASIVE CV LAB;  Service: Cardiovascular;  Laterality: N/A;   LOWER EXTREMITY ANGIOGRAPHY Bilateral 06/15/2019   Procedure: Lower Extremity Angiography;  Surgeon: Runell Gess, MD;  Location: [facility] INVASIVE CV LAB;  Service: Cardiovascular;  Laterality: Bilateral;   LOWER EXTREMITY ANGIOGRAPHY Right 08/03/2019   Procedure: LOWER EXTREMITY ANGIOGRAPHY;  Surgeon: Runell Gess, MD;  [facility];  Service: Cardiovascular;  Laterality: Right;   LOWER EXTREMITY ANGIOGRAPHY N/A 09/07/2021   Procedure: LOWER EXTREMITY ANGIOGRAPHY;  Surgeon: Cephus Shelling, MD;  [facility];  Service: Cardiovascular;  Laterality: N/A;   PATCH ANGIOPLASTY Right 07/21/2019   Procedure: Patch Angioplasty Right Femoral Artery;  Surgeon: Nada Libman, MD;  Location: [facility] OR;  Service: Vascular;  Laterality: Right;   PERIPHERAL INTRAVASCULAR LITHOTRIPSY  11/25/2019   Procedure: INTRAVASCULAR LITHOTRIPSY;  Surgeon: Iran Ouch, MD;  [facility];  Service: Cardiovascular;;  LT. SFA   PERIPHERAL VASCULAR ATHERECTOMY  08/03/2019   Procedure: PERIPHERAL VASCULAR ATHERECTOMY;  Surgeon: Runell Gess, MD;  Location: [facility] INVASIVE CV LAB;  Service: Cardiovascular;;  right SFA, right TP trunk   PERIPHERAL VASCULAR ATHERECTOMY  11/25/2019   Procedure: PERIPHERAL VASCULAR ATHERECTOMY;  Surgeon: Iran Ouch, MD;  [facility];  Service: Cardiovascular;;  Lt.  POPLITEAL and AT   PERIPHERAL VASCULAR BALLOON ANGIOPLASTY Left 06/15/2019   Procedure: PERIPHERAL VASCULAR BALLOON ANGIOPLASTY;  Surgeon: Runell Gess, MD;  [facility];  Service: Cardiovascular;  Laterality: Left;  SFA UNSUCCESSFUL UNABLE TO CROSS LESION   PERIPHERAL VASCULAR BALLOON ANGIOPLASTY  08/03/2019   Procedure: PERIPHERAL VASCULAR  BALLOON ANGIOPLASTY;  Surgeon: Runell Gess, MD;  [facility];  Service: Cardiovascular;;  right SFA, Right TP trunk   PERIPHERAL VASCULAR BALLOON ANGIOPLASTY  09/07/2021   Procedure: PERIPHERAL VASCULAR BALLOON ANGIOPLASTY;  Surgeon: Cephus Shelling, MD;  [facility];  Service: Cardiovascular;;   PERIPHERAL VASCULAR BALLOON ANGIOPLASTY Right 10/13/2021   Procedure: PERIPHERAL VASCULAR BALLOON ANGIOPLASTY;  Surgeon: Leonie Douglas, MD;  [facility];  Service: Cardiovascular;  Laterality: Right;   PERIPHERAL VASCULAR BALLOON ANGIOPLASTY  11/09/2022   Procedure: PERIPHERAL VASCULAR BALLOON ANGIOPLASTY;  Surgeon: Leonie Douglas, MD;  [facility];  Service: Cardiovascular;;  fem-pop bypass and AT   PORTACATH PLACEMENT Left 04/29/2023   Procedure: INSERTION PORT-A-CATH;  Surgeon: Fritzi Mandes, MD;  Location: [facility] ORS;  Service: General;  Laterality: Left;   REMOVAL OF STONES  04/07/2023   Procedure: REMOVAL OF STONES;  Surgeon: Vida Rigger, MD;  [facility] ENDOS[facility];  Service: Gastroenterology;;   Dennison Mascot  04/07/2023   Procedure: Dennison Mascot;  Surgeon: Vida Rigger, MD;  Location: [facility] ENDOSCOPY;  Service: Gastroenterology;;   TEE WITHOUT CARDIOVERSION N/A 03/24/2020   Procedure: TRANSESOPHAGEAL ECHOCARDIOGRAM (TEE);  Surgeon: Linden Dolin, MD;  Location: [facility] OR;  Service: Open Heart Surgery;  Laterality: N/A;   TONSILLECTOMY     and adenoidectomy   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/2019   Procedure: TRANSMETATARSAL AMPUTATION;  Surgeon: Vivi Barrack, DPM;  Location[facility] OR;  Service: Podiatry;  Laterality: Right;   TRANSMETATARSAL AMPUTATION Left 05/11/2021   Procedure: LEFT TRANSMETATARSAL AMPUTATION;  Surgeon: Maeola Harman, MD;  Location: [facility] OR;  Service: Vascular;  Laterality: Left;   TUBAL LIGATION       Outpatient Encounter Medications as of 06/27/2023  Medication Sig   aspirin  EC 81 MG tablet Take 81 mg by mouth at bedtime. Swallow whole.   atorvastatin (LIPITOR) 80 MG tablet Take 1 tablet (80 mg total) by mouth daily.   brimonidine (ALPHAGAN) 0.2 % ophthalmic solution Place 1 drop into the right eye 2 (two) times daily.   hydrochlorothiazide (HYDRODIURIL) 12.5 MG tablet Take 1 tablet by mouth once daily   Insulin Lispro Prot & Lispro (HUMALOG MIX 75/25 KWIKPEN) (75-25) 100 UNIT/ML Kwikpen INJECT 8 UNITS SUBCUTANEOUSLY ONCE DAILY WITH BREAKFAST   Insulin Pen Needle (ULTRA-THIN II MINI PEN NEEDLE) 31G X 5 MM MISC USE AS DIRECTED   JARDIANCE 25 MG TABS tablet TAKE 1 TABLET BY MOUTH ONCE DAILY BEFORE BREAKFAST   Lancets (ONETOUCH DELICA PLUS LANCET33G) MISC Use to check blood sugars twice daily. (Patient taking differently: Use to check blood sugars  daily.)   lidocaine-prilocaine (EMLA) cream Apply to affected area once   magnesium oxide (MAG-OX) 400 (240 Mg) MG tablet Take 1 tablet (400 mg total) by mouth 2 (two) times daily.   metFORMIN (GLUCOPHAGE) 1000 MG tablet Take 1 tablet by mouth twice daily   metoprolol tartrate (LOPRESSOR) 50 MG tablet Take 1 tablet (50 mg total) by mouth 2 (two) times daily.   Multiple Vitamins-Minerals (MULTI FOR HER PO) Take by mouth.   mupirocin ointment (BACTROBAN) 2 % Apply 1 Application topically 2 (two) times daily. (Patient taking differently: Apply 1 Application topically 2 (two) times daily. As needed)   ondansetron (ZOFRAN) 8 MG tablet Take 1 tablet (8 mg total) by mouth every 8 (eight) hours as needed for nausea or vomiting. Start on the third day after cisplatin.   ONETOUCH VERIO test strip USE TO CHECK BLOOD SUGARS ONCE DAILY   pantoprazole (PROTONIX) 40 MG tablet Take 1 tablet by mouth once daily   prochlorperazine (COMPAZINE) 10 MG tablet Take 1 tablet (10 mg total) by mouth every 6 (six) hours as needed (Nausea or vomiting).   Propylene Glycol (SYSTANE BALANCE) 0.6 % SOLN Place 1 drop into the left eye 3 (three) times daily as  needed (dry/irritated eyes).   repaglinide (PRANDIN) 2 MG tablet Take 1 tab by breakfast and 1 tab before dinner (Patient taking differently: Take 2 mg by mouth daily.)   rivaroxaban (XARELTO) 2.5 MG TABS tablet Take 1 tablet by mouth twice daily   telmisartan (MICARDIS) 80 MG tablet Take 1 tablet by mouth once daily   No facility-administered encounter medications on file as of 06/27/2023.     Today's Vitals   06/27/23 0812 06/27/23 0814  BP: (!) 136/57 (!) 118/48  Pulse: 87   Resp: 16   Temp: 98.2 F (36.8 C)  TempSrc: Oral   SpO2: 99%   Weight: 133 lb 1.6 oz (60.4 kg)   Height: 5' 5.5" (1.664 m)    Body mass index is 21.81 kg/m.   PHYSICAL EXAM GENERAL:alert, no distress and comfortable SKIN: no rash  EYES: sclera clear NECK: without mass LYMPH:  no palpable cervical or supraclavicular lymphadenopathy  LUNGS: clear with normal breathing effort HEART: regular rate & rhythm, no lower extremity edema ABDOMEN: abdomen soft, non-tender and normal bowel sounds NEURO: alert & oriented x 3 with fluent speech, no focal motor deficits PAC without erythema    CBC    Component Value Date/Time   WBC 10.6 (H) 06/27/2023 0751   WBC 9.3 04/09/2023 0301   RBC 3.01 (L) 06/27/2023 0751   HGB 9.3 (L) 06/27/2023 0751   HGB 12.2 07/29/2020 0942   HCT 28.8 (L) 06/27/2023 0751   HCT 37.4 07/29/2020 0942   PLT 254 06/27/2023 0751   PLT 304 07/29/2020 0942   MCV 95.7 06/27/2023 0751   MCV 84 07/29/2020 0942   MCH 30.9 06/27/2023 0751   MCHC 32.3 06/27/2023 0751   RDW 20.4 (H) 06/27/2023 0751   RDW 14.8 07/29/2020 0942   LYMPHSABS 1.1 06/27/2023 0751   MONOABS 1.3 (H) 06/27/2023 0751   EOSABS 0.2 06/27/2023 0751   BASOSABS 0.1 06/27/2023 0751     CMP     Component Value Date/Time   NA 138 06/27/2023 0751   NA 142 07/29/2020 0942   K 4.1 06/27/2023 0751   CL 101 06/27/2023 0751   CO2 28 06/27/2023 0751   GLUCOSE 181 (H) 06/27/2023 0751   BUN 21 06/27/2023 0751   BUN 17  07/29/2020 0942   CREATININE 0.97 06/27/2023 0751   CREATININE 0.96 10/12/2021 1644   CALCIUM 10.3 06/27/2023 0751   PROT 6.6 06/27/2023 0751   PROT 6.3 07/28/2020 0945   ALBUMIN 3.7 06/27/2023 0751   ALBUMIN 4.2 07/28/2020 0945   AST 32 06/27/2023 0751   ALT 30 06/27/2023 0751   ALKPHOS 153 (H) 06/27/2023 0751   BILITOT 0.5 06/27/2023 0751   GFRNONAA 59 (L) 06/27/2023 0751   GFRAA 75 07/29/2020 0942     ASSESSMENT & PLAN:[name] is a 82 y.o. female with    Gallbladder cancer, cT4N0M0, stage III -Patient had 1 episode of abdominal pain in October 2023, and was found to have a large gallbladder stone. -She underwent laparoscopic attempted cholecystectomy but was not able to be resected due to adhesions to the colon. -She developed jaundice in June 2024, status post ERCP and stent placement in [place].  CT and MRI scan showed a liver mass adjacent to the gallbladder fossae, and a biopsy confirmed adenocarcinoma.   -The immunostain is c/w upper GI primary.  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  Unfortunately this was felt to be not resectable by Dr. Freida Busman. -CT chest 04/22/23 negative for distant metastasis  -Started first-line cisplatin, gemcitabine  D1, 15 q28 days and durvalumab D1 q28 days, starting 05/03/23, s/p C2 D15 + IV Mg -Ms. Sanzone appears stable.  Tolerating cis/gem and Durvalumab very well with stable mild fatigue and pre-existing neuropathy.  Diarrhea resolved on chemo and CA 19-9 trending down consistent with a very good clinical response -Able to recover and function with good PS, no clinical evidence of progression.  -Labs reviewed, adequate to proceed with C3 D1 cis/gem and durvalumab 10/4 as scheduled -F/up and C3 D15 in 2 weeks  Essential hypertension -Follow-up with PCP  -Continue medication, -Will monitor her blood glucose and blood pressure closely during her chemotherapy -She will watch BP at home    DM2  (diabetes mellitus, type 2)  -Follow-up with PCP and other specialists -Continue medication, -Will monitor her blood glucose and blood pressure closely during her chemotherapy      PLAN: -Labs reviewed -Proceed with C3 D1 cis/gem and durvalumab 10/4 as scheduled, no dose adjustments -F/up and C3 D15 cis/gem in 2 weeks  Orders Placed This Encounter  Procedures   CT CHEST ABDOMEN PELVIS W CONTRAST    Standing Status:   Future    Standing Expiration Date:   06/26/2024    Order Specific Question:   If indicated for the ordered procedure, I authorize the administration of contrast media per Radiology protocol    Answer:   Yes    Order Specific Question:   Does the patient have a contrast media/X-ray dye allergy?    Answer:   No    Order Specific Question:   Preferred imaging location?    Answer:   [facility]    Order Specific Question:   If indicated for the ordered procedure, I authorize the administration of oral contrast media per Radiology protocol    Answer:   Yes   CBC with Differential (Cancer Center Only)    Standing Status:   Future    Standing Expiration Date:   07/24/2024   CMP (Cancer Center only)    Standing Status:   Future    Standing Expiration Date:   07/24/2024   Magnesium    Standing Status:   Future    Standing Expiration Date:   07/24/2024   CBC with Differential (Cancer Center Only)    Standing Status:   Future    Standing Expiration Date:   08/07/2024   CMP (Cancer Center only)    Standing Status:   Future    Standing Expiration Date:   08/07/2024   T4    Standing Status:   Future    Standing Expiration Date:   08/07/2024   TSH    Standing Status:   Future    Standing Expiration Date:   08/07/2024   Magnesium    Standing Status:   Future    Standing Expiration Date:   08/07/2024      All questions were answered. The patient knows to call the clinic with any problems, questions or concerns. No barriers to learning were detected.   Santiago Glad, NP-C 06/27/2023

## 2023-06-25 NOTE — Progress Notes (Signed)
Outpatient Endocrinology Note [place] Chandell Attridge, MD  06/25/23  Patient's Name: [name]    DOB: [birth year]    MRN: [id number]                                                    REASON OF VISIT: Follow-up of type 2 diabetes mellitus  PCP: Shon Hale, MD  HISTORY OF PRESENT ILLNESS:   [name] is a 82 y.o. old female with past medical history listed below, is here for follow up of type 2 diabetes mellitus.   Pertinent Diabetes History: Patient was diagnosed with type 2 diabetes mellitus in 1984.  Patient was initially treated with metformin and she had been on various diabetic medication over the years.  Chronic Diabetes Complications : Retinopathy: yes. Following with ophthalmology.  Nephropathy: microalbuminuria / CKD , on telmisartan Peripheral neuropathy: yes, PAD, following with vascular cardiologist.  Coronary artery disease: yes, s/p CABG in 2021.  Stroke: no  Relevant comorbidities and cardiovascular risk factors: Obesity: no Body mass index is 21.73 kg/m.  Hypertension: yes Hyperlipidemia : yes, on statin  Current / Home Diabetic regimen includes: Humalog mix 8 units with breakfast Metformin 1000 mg BID Jardiance 25 mg daily. Prandin 2mg  BID  Prior diabetic medications:  Glycemic data:   Not able to download glucometer in the clinic today.  Blood sugars reviewed daily from the glucometer as follows.  She has been checking blood sugar at different times of day few times a week. -  125, 157, 70, 94, 85, 125, 74, 86, 69,120, 107, 125, 127,83, 82, 89, 188, 97   Hypoglycemia: Patient has minor hypoglycemic episodes. Patient has hypoglycemia awareness.  Factors modifying glucose control: 1.  Diabetic diet assessment: 3 meals a day.  Tried to limit sweets and fried food.  2.  Staying active or exercising: tried to exercise in bike at home.  3.  Medication compliance: compliant all of the time.  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  Patient reports she has been relatively doing okay on chemotherapy.  Glucometer data as reviewed above.  Mostly acceptable.  She has not been taking Humalog mix at the did not time.  Diabetes regimen as reviewed above.  No other complaints today.  REVIEW OF SYSTEMS As per history of present illness.   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 There is a small calcified mass in the L:A near the LA appendage. No valvular masses seen with associated mitral annular calcification. LA Volume/ BSA27.4 ml/m2 No AS. Right ventricular systolic pressure is elevated at .   Jaundice  Myocardial infarction [facility])    June 2021   Neuromuscular disorder (HCC)    neuropathy in bilateral feet   Peripheral vascular disease (HCC)    Pneumonia    not hosp.    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  PTCAs with drug coated balloon Lt ant tibial artery and Lt popliteal artery  11/26/2019    PAST SURGICAL HISTORY: Past Surgical History:  Procedure Laterality Date   ABDOMINAL AORTAGRAM  11/25/2019   ABDOMINAL AORTOGRAM W/LOWER EXTREMITY    ABDOMINAL AORTOGRAM N/A 06/15/2019   Procedure: ABDOMINAL AORTOGRAM;  Surgeon: Runell Gess, MD;  [facility];  Service: Cardiovascular;  Laterality: N/A;   ABDOMINAL AORTOGRAM W/LOWER  EXTREMITY N/A 11/25/2019   Procedure: ABDOMINAL AORTOGRAM W/LOWER EXTREMITY;  Surgeon: Iran Ouch, MD;  [facility];  Service: Cardiovascular;  Laterality: N/A;  Lt leg   ABDOMINAL AORTOGRAM W/LOWER EXTREMITY N/A 08/01/2020   Procedure: ABDOMINAL AORTOGRAM W/LOWER EXTREMITY;  Surgeon: Runell Gess, MD;  [facility];  Service: Cardiovascular;  Laterality: N/A;   ABDOMINAL AORTOGRAM W/LOWER EXTREMITY N/A 04/28/2021   Procedure: ABDOMINAL AORTOGRAM W/LOWER EXTREMITY;  Surgeon: Sherren Kerns, MD;  [facility];  Service: Cardiovascular;  Laterality: N/A;   ABDOMINAL AORTOGRAM W/LOWER EXTREMITY N/A 10/13/2021   Procedure: ABDOMINAL AORTOGRAM W/LOWER EXTREMITY;  Surgeon: Leonie Douglas, MD;  [facility];  Service: Cardiovascular;  Laterality: N/A;   ABDOMINAL AORTOGRAM W/LOWER EXTREMITY N/A 11/09/2022   Procedure: ABDOMINAL AORTOGRAM W/LOWER EXTREMITY;  Surgeon: Leonie Douglas, MD;  [facility];  Service: Cardiovascular;  Laterality: N/A;   ABDOMINAL AORTOGRAM W/LOWER EXTREMITY N/A 11/16/2022   Procedure: ABDOMINAL AORTOGRAM W/LOWER EXTREMITY;  Surgeon: Leonie Douglas, MD;  [facility];  Service: Cardiovascular;  Laterality: N/A;   AMPUTATION TOE Left 05/03/2021   Procedure: AMPUTATION OF THIRD LEFT  TOE;  Surgeon: Leonie Douglas, MD;  Location: [facility] OR;  Service: Vascular;  Laterality: Left;   APPENDECTOMY     APPLICATION OF WOUND VAC Right 07/21/2019   Procedure: Application Of Prevena Wound Vac Right Groin;  Surgeon: Nada Libman, MD;  Location: [facility] OR;  Service: Vascular;  Laterality: Right;   BILIARY BRUSHING  04/07/2023   Procedure: BILIARY BRUSHING;  Surgeon: Vida Rigger, MD;  Location: [facility] ENDOSCOPY;  Service: Gastroenterology;;   BILIARY STENT PLACEMENT  04/07/2023   Procedure: BILIARY STENT PLACEMENT;  Surgeon: Vida Rigger, MD;  Location: [facility] ENDOSCOPY;  Service:  Gastroenterology;;   CARPAL TUNNEL RELEASE Left    CARPAL TUNNEL RELEASE Right    CESAREAN SECTION     x 2   CHOLECYSTECTOMY N/A 10/12/2022   Procedure: LAPAROSCOPIC PARTIAL FENESTRATING CHOLECYSTECTOMY;  Surgeon: Fritzi Mandes, MD;  [facility];  Service: General;  Laterality: N/A;   COLONOSCOPY     CORONARY ARTERY BYPASS GRAFT N/A 03/24/2020   Procedure: CORONARY ARTERY BYPASS GRAFTING (CABG) using LIMA to LAD (m); RIMA to RAMUS; Endoscopic Right Greater Saphenous Vein: SVG to Diag1; SVG to PLB (right); and SVG to PL (left).;  Surgeon: Linden Dolin, MD;  [facility];  Service: Open Heart Surgery;  Laterality: N/A;  BILATERAL IMA   ENDARTERECTOMY FEMORAL Right 07/21/2019   Procedure: RIGHT ENDARTERECTOMY FEMORAL WITH PATCH ANGIOPLASTY;  Surgeon: Nada Libman, MD;  [facility];  Service: Vascular;  Laterality: Right;   ENDARTERECTOMY FEMORAL Right 08/16/2020   Procedure: RIGHT FEMORAL  ENDARTERECTOMY;  Surgeon: Maeola Harman, MD;  Location: [facility] OR;  Service: Vascular;  Laterality: Right;   ENDOVEIN HARVEST OF GREATER SAPHENOUS VEIN Right 03/24/2020   Procedure: ENDOVEIN HARVEST OF GREATER SAPHENOUS VEIN;  Surgeon: Linden Dolin, MD;  [facility];  Service: Open Heart Surgery;  Laterality: Right;   ERCP N/A 04/07/2023   Procedure: ENDOSCOPIC RETROGRADE CHOLANGIOPANCREATOGRAPHY (ERCP);  Surgeon: Vida Rigger, MD;  Location: [facility] ENDOSCOPY;  Service: Gastroenterology;  Laterality: N/A;   EYE SURGERY     cataract removal bilaterally   FEMORAL-POPLITEAL BYPASS GRAFT Right 08/16/2020   Procedure: BYPASS GRAFT FEMORAL-POPLITEAL ARTERY;  Surgeon: Maeola Harman, MD;  Location: [facility] OR;  Service: Vascular;  Laterality: Right;   FEMORAL-POPLITEAL BYPASS GRAFT Left 05/03/2021   Procedure: LEFT FEMORAL TO BELOW KNEE POPLITEAL ARTERY BYPASS GRAFTING WITH 6MMX80 PTFE GRAFT;  Surgeon: Leonie Douglas, MD;  [facility];  Service: Vascular;  Laterality: Left;    LEFT HEART CATH AND CORONARY ANGIOGRAPHY N/A 03/22/2020   Procedure: LEFT HEART CATH AND CORONARY ANGIOGRAPHY;  Surgeon: Swaziland, Peter M, MD;  Location: [facility] INVASIVE CV LAB;  Service: Cardiovascular;  Laterality: N/A;   LOWER EXTREMITY ANGIOGRAPHY Bilateral 06/15/2019   Procedure: Lower Extremity Angiography;  Surgeon: Runell Gess, MD;  Location: [facility] INVASIVE CV LAB;  Service: Cardiovascular;  Laterality: Bilateral;   LOWER EXTREMITY ANGIOGRAPHY Right 08/03/2019   Procedure: LOWER EXTREMITY ANGIOGRAPHY;  Surgeon: Runell Gess, MD;  [facility];  Service: Cardiovascular;  Laterality: Right;   LOWER EXTREMITY ANGIOGRAPHY N/A 09/07/2021   Procedure: LOWER EXTREMITY ANGIOGRAPHY;  Surgeon: Cephus Shelling, MD;  [facility];  Service: Cardiovascular;  Laterality: N/A;   PATCH ANGIOPLASTY Right 07/21/2019   Procedure: Patch Angioplasty Right Femoral Artery;  Surgeon: Nada Libman, MD;  Location: [facility] OR;  Service: Vascular;  Laterality: Right;   PERIPHERAL INTRAVASCULAR LITHOTRIPSY  11/25/2019   Procedure: INTRAVASCULAR LITHOTRIPSY;  Surgeon: Iran Ouch, MD;  [facility];  Service: Cardiovascular;;  LT. SFA   PERIPHERAL VASCULAR ATHERECTOMY  08/03/2019   Procedure: PERIPHERAL VASCULAR ATHERECTOMY;  Surgeon: Runell Gess, MD;  Location: [facility] INVASIVE CV LAB;  Service: Cardiovascular;;  right SFA, right TP trunk   PERIPHERAL VASCULAR ATHERECTOMY  11/25/2019   Procedure: PERIPHERAL VASCULAR ATHERECTOMY;  Surgeon: Iran Ouch, MD;  [facility];  Service: Cardiovascular;;  Lt.  POPLITEAL and AT   PERIPHERAL VASCULAR BALLOON ANGIOPLASTY Left 06/15/2019   Procedure: PERIPHERAL VASCULAR BALLOON ANGIOPLASTY;  Surgeon: Runell Gess, MD;  [facility];  Service: Cardiovascular;  Laterality: Left;  SFA UNSUCCESSFUL UNABLE TO CROSS LESION   PERIPHERAL VASCULAR BALLOON ANGIOPLASTY  08/03/2019    Procedure: PERIPHERAL VASCULAR BALLOON ANGIOPLASTY;  Surgeon: Runell Gess, MD;  [facility];  Service: Cardiovascular;;  right SFA, Right TP trunk   PERIPHERAL VASCULAR BALLOON ANGIOPLASTY  09/07/2021   Procedure: PERIPHERAL VASCULAR BALLOON ANGIOPLASTY;  Surgeon: Cephus Shelling, MD;  [facility];  Service: Cardiovascular;;   PERIPHERAL VASCULAR BALLOON ANGIOPLASTY Right 10/13/2021   Procedure: PERIPHERAL VASCULAR BALLOON ANGIOPLASTY;  Surgeon: Leonie Douglas, MD;  [facility];  Service: Cardiovascular;  Laterality: Right;   PERIPHERAL VASCULAR BALLOON ANGIOPLASTY  11/09/2022   Procedure: PERIPHERAL VASCULAR BALLOON ANGIOPLASTY;  Surgeon: Leonie Douglas, MD;  [facility];  Service: Cardiovascular;;  fem-pop bypass and AT   PORTACATH PLACEMENT Left  04/29/2023   Procedure: INSERTION PORT-A-CATH;  Surgeon: Fritzi Mandes, MD;  Location: [facility] ORS;  Service: General;  Laterality: Left;   REMOVAL OF STONES  04/07/2023   Procedure: REMOVAL OF STONES;  Surgeon: Vida Rigger, MD;  Location: [facility] ENDOSCOPY;  Service: Gastroenterology;;   Dennison Mascot  04/07/2023   Procedure: Dennison Mascot;  Surgeon: Vida Rigger, MD;  Location: [facility] ENDOSCOPY;  Service: Gastroenterology;;   TEE WITHOUT CARDIOVERSION N/A 03/24/2020   Procedure: TRANSESOPHAGEAL ECHOCARDIOGRAM (TEE);  Surgeon: Linden Dolin, MD;  Location: [facility] OR;  Service: Open Heart Surgery;  Laterality: N/A;   TONSILLECTOMY     and adenoidectomy   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/2019   Procedure: TRANSMETATARSAL AMPUTATION;  Surgeon: Vivi Barrack, DPM;  [facility];  Service: Podiatry;  Laterality: Right;   TRANSMETATARSAL AMPUTATION Left 05/11/2021   Procedure: LEFT TRANSMETATARSAL AMPUTATION;  Surgeon: Maeola Harman, MD;  Location: [facility] OR;  Service: Vascular;  Laterality: Left;   TUBAL LIGATION      ALLERGIES: Allergies  Allergen Reactions    Versed [Midazolam] Anxiety    Frantic, out of my mind, agitated    Augmentin [Amoxicillin-Pot Clavulanate] Other (See Comments)    Elevates potassium level   Bactrim [Sulfamethoxazole-Trimethoprim] Other (See Comments)    ^ K+( elevated)    Demerol [Meperidine]     Delusional    Meperidine Hcl     Other reaction(s): delusional   Scopolamine     Delusional    Tramadol Other (See Comments)    Keeps awake    FAMILY HISTORY:  Family History  Problem Relation Age of Onset   Cancer - Prostate Father    Cancer - Colon Father    Stroke Mother    Hypertension Mother    Hyperlipidemia Mother    Melanoma Brother     SOCIAL HISTORY: Social History   Socioeconomic History   Marital status: Widowed    Spouse name: Not on file   Number of children: 3   Years of education: Not on file   Highest education level: Not on file  Occupational History   Not on file  Tobacco Use   Smoking status: Former    Current packs/day: 0.00    Types: Cigarettes    Quit date: 03/31/1972    Years since quitting: 51.2   Smokeless tobacco: Never  Vaping Use   Vaping status: Never Used  Substance and Sexual Activity   Alcohol use: No   Drug use: No   Sexual activity: Not on file  Other Topics Concern   Not on file  Social History Narrative   Not on file   Social Determinants of Health   Financial Resource Strain: Not on file  Food Insecurity: No Food Insecurity (04/05/2023)   Hunger Vital Sign    Worried About Running Out of Food in the Last Year: Never true    Ran Out of Food in the Last Year: Never true  Transportation Needs: No Transportation Needs (04/05/2023)   PRAPARE - Administrator, Civil Service (Medical): No    Lack of Transportation (Non-Medical): No  Physical Activity: Not on file  Stress: Not on file  Social Connections: Unknown (02/02/2022)   [facility]   Social Network    Social Network: Not on file    MEDICATIONS:  Current  Outpatient Medications  Medication Sig Dispense Refill   aspirin EC 81 MG tablet Take 81 mg by mouth at bedtime. Swallow whole.  atorvastatin (LIPITOR) 80 MG tablet Take 1 tablet (80 mg total) by mouth daily. 90 tablet 3   brimonidine (ALPHAGAN) 0.2 % ophthalmic solution Place 1 drop into the right eye 2 (two) times daily.     hydrochlorothiazide (HYDRODIURIL) 12.5 MG tablet Take 1 tablet by mouth once daily 90 tablet 0   Insulin Lispro Prot & Lispro (HUMALOG MIX 75/25 KWIKPEN) (75-25) 100 UNIT/ML Kwikpen INJECT 8 UNITS SUBCUTANEOUSLY ONCE DAILY WITH BREAKFAST 15 mL 4   Insulin Pen Needle (ULTRA-THIN II MINI PEN NEEDLE) 31G X 5 MM MISC USE AS DIRECTED 50 each 2   JARDIANCE 25 MG TABS tablet TAKE 1 TABLET BY MOUTH ONCE DAILY BEFORE BREAKFAST 90 tablet 3   Lancets (ONETOUCH DELICA PLUS LANCET33G) MISC Use to check blood sugars twice daily. (Patient taking differently: Use to check blood sugars  daily.) 100 each 5   lidocaine-prilocaine (EMLA) cream Apply to affected area once 30 g 3   magnesium oxide (MAG-OX) 400 (240 Mg) MG tablet Take 1 tablet (400 mg total) by mouth 2 (two) times daily. 60 tablet 1   metFORMIN (GLUCOPHAGE) 1000 MG tablet Take 1 tablet by mouth twice daily 180 tablet 0   metoprolol tartrate (LOPRESSOR) 50 MG tablet Take 1 tablet (50 mg total) by mouth 2 (two) times daily. 180 tablet 0   Multiple Vitamins-Minerals (MULTI FOR HER PO) Take by mouth.     mupirocin ointment (BACTROBAN) 2 % Apply 1 Application topically 2 (two) times daily. (Patient taking differently: Apply 1 Application topically 2 (two) times daily. As needed) 30 g 2   ondansetron (ZOFRAN) 8 MG tablet Take 1 tablet (8 mg total) by mouth every 8 (eight) hours as needed for nausea or vomiting. Start on the third day after cisplatin. 30 tablet 1   ONETOUCH VERIO test strip USE TO CHECK BLOOD SUGARS ONCE DAILY 100 each 2   pantoprazole (PROTONIX) 40 MG tablet Take 1 tablet by mouth once daily 90 tablet 3    prochlorperazine (COMPAZINE) 10 MG tablet Take 1 tablet (10 mg total) by mouth every 6 (six) hours as needed (Nausea or vomiting). 30 tablet 1   Propylene Glycol (SYSTANE BALANCE) 0.6 % SOLN Place 1 drop into the left eye 3 (three) times daily as needed (dry/irritated eyes).     repaglinide (PRANDIN) 2 MG tablet Take 1 tab by breakfast and 1 tab before dinner (Patient taking differently: Take 2 mg by mouth daily.) 180 tablet 2   rivaroxaban (XARELTO) 2.5 MG TABS tablet Take 1 tablet by mouth twice daily 60 tablet 11   telmisartan (MICARDIS) 80 MG tablet Take 1 tablet by mouth once daily 90 tablet 3   No current facility-administered medications for this visit.    PHYSICAL EXAM: Vitals:   06/25/23 1301  BP: 123/60  Pulse: 86  SpO2: 99%  Weight: 132 lb 9.6 oz (60.1 kg)  Height: 5' 5.5" (1.664 m)   Body mass index is 21.73 kg/m.  Wt Readings from Last 3 Encounters:  06/25/23 132 lb 9.6 oz (60.1 kg)  06/18/23 136 lb (61.7 kg)  06/13/23 133 lb (60.3 kg)    General: Well developed, well nourished female in no apparent distress.  HEENT: AT/[place], no external lesions.  Eyes: Conjunctiva clear and no icterus. Neck: Neck supple  Lungs: Respirations not labored Neurologic: Alert, oriented, normal speech Extremities / Skin: Dry. No sores or rashes noted.  Psychiatric: Does not appear depressed or anxious  Diabetic Foot Exam - Simple   No data  filed    LABS Reviewed Lab Results  Component Value Date   HGBA1C 7.2 (H) 06/21/2023   HGBA1C 7.9 (H) 03/11/2023   HGBA1C 7.5 (H) 12/07/2022   Lab Results  Component Value Date   FRUCTOSAMINE 261 11/28/2021   FRUCTOSAMINE 275 09/29/2021   FRUCTOSAMINE 237 03/29/2021   Lab Results  Component Value Date   CHOL 97 10/14/2021   HDL 29 (L) 10/14/2021   LDLCALC 50 10/14/2021   TRIG 89 10/14/2021   CHOLHDL 3.3 10/14/2021   Lab Results  Component Value Date   MICRALBCREAT 54.0 (H) 08/28/2022   MICRALBCREAT 16.3 09/29/2021   Lab Results   Component Value Date   CREATININE 0.83 06/21/2023   Lab Results  Component Value Date   GFR 66.19 06/21/2023    ASSESSMENT / PLAN  1. Uncontrolled type 2 diabetes mellitus with hyperglycemia, with long-term current use of insulin (HCC)   2. Microalbuminuria due to type 2 diabetes mellitus (HCC)   3. 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  Lab Results  Component Value Date   HGBA1C 7.2 (H) 06/21/2023    - Hemoglobin A1c goal : <7.5%  - Medications: See below.  No change. Humalog mix 8 units with breakfast Metformin 1000 mg BID Jardiance 25 mg daily. Prandin 2mg  BID   - Home glucose testing: In the morning fasting and at bedtime and other times of the day few times a week. - Discussed/ Gave Hypoglycemia treatment plan.  # Consult : not required at this time.   # Annual urine for microalbuminuria/ creatinine ratio, no microalbuminuria currently, continue ACE/ARB , telmisartan, will check in next follow-up visit. Last  Lab Results  Component Value Date   MICRALBCREAT 54.0 (H) 08/28/2022    # Foot check nightly.  # She has diabetic retinopathy follow-up with ophthalmology.  2. Blood pressure  -  BP Readings from Last 1 Encounters:  06/25/23 123/60    - Control is in target.  - No change in current plans.  3. Lipid status / Hyperlipidemia - Last  Lab Results  Component Value Date   LDLCALC 50 10/14/2021   - Continue atorvastatin 80 mg daily.    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   All questions answered and patient verbalized understanding of the plan.  [place] Stewart Sasaki, MD [facility] Endocrinology [facility] [address] [place], [address] [place], [place] [address] Phone # [phone]  At least part of this note was generated using voice recognition software. Inadvertent word errors may have occurred, which were not recognized during the proofreading process.

## 2023-06-25 NOTE — Patient Instructions (Signed)
No change in diabetes regimen

## 2023-06-27 ENCOUNTER — Ambulatory Visit: Payer: Medicare Other

## 2023-06-27 ENCOUNTER — Encounter: Payer: Self-pay | Admitting: Hematology

## 2023-06-27 ENCOUNTER — Encounter: Payer: Self-pay | Admitting: Nurse Practitioner

## 2023-06-27 ENCOUNTER — Inpatient Hospital Stay: Payer: Medicare Other

## 2023-06-27 ENCOUNTER — Inpatient Hospital Stay: Payer: Medicare Other | Attending: Hematology | Admitting: Nurse Practitioner

## 2023-06-27 VITALS — BP 118/48 | HR 87 | Temp 98.2°F | Resp 16 | Ht 65.5 in | Wt 133.1 lb

## 2023-06-27 DIAGNOSIS — C23 Malignant neoplasm of gallbladder: Secondary | ICD-10-CM | POA: Diagnosis not present

## 2023-06-27 DIAGNOSIS — Z5112 Encounter for antineoplastic immunotherapy: Secondary | ICD-10-CM | POA: Insufficient documentation

## 2023-06-27 DIAGNOSIS — Z79620 Long term (current) use of immunosuppressive biologic: Secondary | ICD-10-CM | POA: Insufficient documentation

## 2023-06-27 DIAGNOSIS — Z5111 Encounter for antineoplastic chemotherapy: Secondary | ICD-10-CM | POA: Diagnosis not present

## 2023-06-27 DIAGNOSIS — D649 Anemia, unspecified: Secondary | ICD-10-CM | POA: Diagnosis not present

## 2023-06-27 DIAGNOSIS — Z452 Encounter for adjustment and management of vascular access device: Secondary | ICD-10-CM | POA: Diagnosis not present

## 2023-06-27 DIAGNOSIS — E119 Type 2 diabetes mellitus without complications: Secondary | ICD-10-CM | POA: Insufficient documentation

## 2023-06-27 DIAGNOSIS — I10 Essential (primary) hypertension: Secondary | ICD-10-CM | POA: Diagnosis not present

## 2023-06-27 DIAGNOSIS — Z95828 Presence of other vascular implants and grafts: Secondary | ICD-10-CM

## 2023-06-27 LAB — CMP (CANCER CENTER ONLY)
ALT: 30 U/L (ref 0–44)
AST: 32 U/L (ref 15–41)
Albumin: 3.7 g/dL (ref 3.5–5.0)
Alkaline Phosphatase: 153 U/L — ABNORMAL HIGH (ref 38–126)
Anion gap: 9 (ref 5–15)
BUN: 21 mg/dL (ref 8–23)
CO2: 28 mmol/L (ref 22–32)
Calcium: 10.3 mg/dL (ref 8.9–10.3)
Chloride: 101 mmol/L (ref 98–111)
Creatinine: 0.97 mg/dL (ref 0.44–1.00)
GFR, Estimated: 59 mL/min — ABNORMAL LOW (ref >60)
Glucose, Bld: 181 mg/dL — ABNORMAL HIGH (ref 70–99)
Potassium: 4.1 mmol/L (ref 3.5–5.1)
Sodium: 138 mmol/L (ref 135–145)
Total Bilirubin: 0.5 mg/dL (ref 0.3–1.2)
Total Protein: 6.6 g/dL (ref 6.5–8.1)

## 2023-06-27 LAB — CBC WITH DIFFERENTIAL (CANCER CENTER ONLY)
Abs Immature Granulocytes: 0.03 10*3/uL (ref 0.00–0.07)
Basophils Absolute: 0.1 10*3/uL (ref 0.0–0.1)
Basophils Relative: 1 %
Eosinophils Absolute: 0.2 10*3/uL (ref 0.0–0.5)
Eosinophils Relative: 2 %
HCT: 28.8 % — ABNORMAL LOW (ref 36.0–46.0)
Hemoglobin: 9.3 g/dL — ABNORMAL LOW (ref 12.0–15.0)
Immature Granulocytes: 0 %
Lymphocytes Relative: 11 %
Lymphs Abs: 1.1 10*3/uL (ref 0.7–4.0)
MCH: 30.9 pg (ref 26.0–34.0)
MCHC: 32.3 g/dL (ref 30.0–36.0)
MCV: 95.7 fL (ref 80.0–100.0)
Monocytes Absolute: 1.3 10*3/uL — ABNORMAL HIGH (ref 0.1–1.0)
Monocytes Relative: 12 %
Neutro Abs: 8 10*3/uL — ABNORMAL HIGH (ref 1.7–7.7)
Neutrophils Relative %: 74 %
Platelet Count: 254 10*3/uL (ref 150–400)
RBC: 3.01 MIL/uL — ABNORMAL LOW (ref 3.87–5.11)
RDW: 20.4 % — ABNORMAL HIGH (ref 11.5–15.5)
WBC Count: 10.6 10*3/uL — ABNORMAL HIGH (ref 4.0–10.5)
nRBC: 0 % (ref 0.0–0.2)

## 2023-06-27 LAB — MAGNESIUM: Magnesium: 1.9 mg/dL (ref 1.7–2.4)

## 2023-06-27 LAB — TSH: TSH: 1.371 u[IU]/mL (ref 0.350–4.500)

## 2023-06-27 MED ORDER — SODIUM CHLORIDE 0.9% FLUSH
10.0000 mL | Freq: Once | INTRAVENOUS | Status: AC
  Administered 2023-06-27: 10 mL

## 2023-06-27 MED ORDER — HEPARIN SOD (PORK) LOCK FLUSH 100 UNIT/ML IV SOLN
500.0000 [IU] | Freq: Once | INTRAVENOUS | Status: AC
  Administered 2023-06-27: 500 [IU]

## 2023-06-27 MED FILL — Fosaprepitant Dimeglumine For IV Infusion 150 MG (Base Eq): INTRAVENOUS | Qty: 5 | Status: AC

## 2023-06-28 ENCOUNTER — Other Ambulatory Visit: Payer: Self-pay

## 2023-06-28 ENCOUNTER — Inpatient Hospital Stay: Payer: Medicare Other | Admitting: Dietician

## 2023-06-28 ENCOUNTER — Inpatient Hospital Stay: Payer: Medicare Other

## 2023-06-28 VITALS — BP 117/43 | HR 86 | Resp 16

## 2023-06-28 DIAGNOSIS — D649 Anemia, unspecified: Secondary | ICD-10-CM | POA: Diagnosis not present

## 2023-06-28 DIAGNOSIS — C23 Malignant neoplasm of gallbladder: Secondary | ICD-10-CM

## 2023-06-28 DIAGNOSIS — Z5111 Encounter for antineoplastic chemotherapy: Secondary | ICD-10-CM | POA: Diagnosis not present

## 2023-06-28 DIAGNOSIS — Z5112 Encounter for antineoplastic immunotherapy: Secondary | ICD-10-CM | POA: Diagnosis not present

## 2023-06-28 DIAGNOSIS — I10 Essential (primary) hypertension: Secondary | ICD-10-CM | POA: Diagnosis not present

## 2023-06-28 DIAGNOSIS — I739 Peripheral vascular disease, unspecified: Secondary | ICD-10-CM

## 2023-06-28 DIAGNOSIS — E119 Type 2 diabetes mellitus without complications: Secondary | ICD-10-CM | POA: Diagnosis not present

## 2023-06-28 LAB — T4: T4, Total: 12.7 ug/dL — ABNORMAL HIGH (ref 4.5–12.0)

## 2023-06-28 MED ORDER — SODIUM CHLORIDE 0.9% FLUSH
10.0000 mL | INTRAVENOUS | Status: DC | PRN
  Administered 2023-06-28: 10 mL

## 2023-06-28 MED ORDER — HEPARIN SOD (PORK) LOCK FLUSH 100 UNIT/ML IV SOLN
500.0000 [IU] | Freq: Once | INTRAVENOUS | Status: AC | PRN
  Administered 2023-06-28: 500 [IU]

## 2023-06-28 MED ORDER — SODIUM CHLORIDE 0.9 % IV SOLN
800.0000 mg/m2 | Freq: Once | INTRAVENOUS | Status: AC
  Administered 2023-06-28: 1369 mg via INTRAVENOUS
  Filled 2023-06-28: qty 36.01

## 2023-06-28 MED ORDER — PALONOSETRON HCL INJECTION 0.25 MG/5ML
0.2500 mg | Freq: Once | INTRAVENOUS | Status: AC
  Administered 2023-06-28: 0.25 mg via INTRAVENOUS
  Filled 2023-06-28: qty 5

## 2023-06-28 MED ORDER — SODIUM CHLORIDE 0.9 % IV SOLN
150.0000 mg | Freq: Once | INTRAVENOUS | Status: AC
  Administered 2023-06-28: 150 mg via INTRAVENOUS
  Filled 2023-06-28: qty 150

## 2023-06-28 MED ORDER — HYDROCHLOROTHIAZIDE 12.5 MG PO TABS
12.5000 mg | ORAL_TABLET | Freq: Every day | ORAL | 0 refills | Status: DC
Start: 2023-06-28 — End: 2023-12-17

## 2023-06-28 MED ORDER — SODIUM CHLORIDE 0.9 % IV SOLN
20.0000 mg/m2 | Freq: Once | INTRAVENOUS | Status: AC
  Administered 2023-06-28: 34 mg via INTRAVENOUS
  Filled 2023-06-28: qty 34

## 2023-06-28 MED ORDER — SODIUM CHLORIDE 0.9 % IV SOLN: Freq: Once | INTRAVENOUS | Status: AC

## 2023-06-28 MED ORDER — MAGNESIUM SULFATE 4 GM/100ML IV SOLN
4.0000 g | Freq: Once | INTRAVENOUS | Status: AC
  Administered 2023-06-28: 4 g via INTRAVENOUS
  Filled 2023-06-28: qty 100

## 2023-06-28 MED ORDER — SODIUM CHLORIDE 0.9 % IV SOLN
1500.0000 mg | Freq: Once | INTRAVENOUS | Status: AC
  Administered 2023-06-28: 1500 mg via INTRAVENOUS
  Filled 2023-06-28: qty 30

## 2023-06-28 MED ORDER — HYDROCHLOROTHIAZIDE 12.5 MG PO TABS: 12.5000 mg | ORAL_TABLET | Freq: Every day | ORAL | 0 refills | Status: DC

## 2023-06-28 MED ORDER — POTASSIUM CHLORIDE IN NACL 20-0.9 MEQ/L-% IV SOLN
Freq: Once | INTRAVENOUS | Status: AC
  Filled 2023-06-28: qty 1000

## 2023-06-28 NOTE — Patient Instructions (Signed)
[place] [facility]  Discharge Instructions: Thank you for choosing [facility] to provide your oncology and hematology care.   If you have a lab appointment with the Cancer Center, please go directly to the Cancer Center and check in at the registration area.   Wear comfortable clothing and clothing appropriate for easy access to any Portacath or PICC line.   We strive to give you quality time with your provider. You may need to reschedule your appointment if you arrive late (15 or more minutes).  Arriving late affects you and other patients whose appointments are after yours.  Also, if you miss three or more appointments without notifying the office, you may be dismissed from the clinic at the provider's discretion.      For prescription refill requests, have your pharmacy contact our office and allow 72 hours for refills to be completed.    Today you received the following chemotherapy and/or immunotherapy agents: durvalumab, gemcitabine, cisplatin      To help prevent nausea and vomiting after your treatment, we encourage you to take your nausea medication as directed.  BELOW ARE SYMPTOMS THAT SHOULD BE REPORTED IMMEDIATELY: *FEVER GREATER THAN 100.4 F (38 C) OR HIGHER *CHILLS OR SWEATING *NAUSEA AND VOMITING THAT IS NOT CONTROLLED WITH YOUR NAUSEA MEDICATION *UNUSUAL SHORTNESS OF BREATH *UNUSUAL BRUISING OR BLEEDING *URINARY PROBLEMS (pain or burning when urinating, or frequent urination) *BOWEL PROBLEMS (unusual diarrhea, constipation, pain near the anus) TENDERNESS IN MOUTH AND THROAT WITH OR WITHOUT PRESENCE OF ULCERS (sore throat, sores in mouth, or a toothache) UNUSUAL RASH, SWELLING OR PAIN  UNUSUAL VAGINAL DISCHARGE OR ITCHING   Items with * indicate a potential emergency and should be followed up as soon as possible or go to the Emergency Department if any problems should occur.  Please show the CHEMOTHERAPY ALERT CARD or  IMMUNOTHERAPY ALERT CARD at check-in to the Emergency Department and triage nurse.  Should you have questions after your visit or need to cancel or reschedule your appointment, please contact [place] [facility]  Dept: [phone]  and follow the prompts.  Office hours are 8:00 a.m. to 4:30 p.m. Monday - Friday. Please note that voicemails left after 4:00 p.m. may not be returned until the following business day.  We are closed weekends and major holidays. You have access to a nurse at all times for urgent questions. Please call the main number to the clinic Dept: [phone] and follow the prompts.   For any non-urgent questions, you may also contact your provider using MyChart. We now offer e-Visits for anyone 70 and older to request care online for non-urgent symptoms. For details visit [email or web address].   Also download the MyChart app! Go to the app store, search "MyChart", open the app, select [place], and log in with your MyChart username and password.

## 2023-06-28 NOTE — Progress Notes (Signed)
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 She is receiving cisplatin/gemcitabine q14d + durvalumab q28d. Patient is under the care of Dr. Mosetta Putt.   Past medical history includes HTN, CAD s/p CABG x5, PAD, critical lower limb ischemia, GERD, DM2, diabetic retinopathy, HLD, CKD3, s/p transmetatarsal amputation, tinnitus  Met with pt in infusion. She is eating a sandwich at visit. Patient reports tolerating treatment well overall. Diarrhea has resolved. She did not experience this with last treatment. Patient reports not having much of an appetite. Food has lost its appeal, but she is making herself eat 3x/day. Yesterday pt had bowl of oatmeal with walnuts/cashews for breakfast, chicken quesadilla for lunch, sushi (cooked) for dinner. She is drinking Boost Plus, Ensure, and Premier. Patient usually has one daily. Patient continues to be active and enjoys playing pool. Shares that she won her game of 9-ball last night at league.    Nutrition Focused Physical Exam: deferred    Medications: lipitor, hydrochlorothiazide, jardiance, mag-ox, metformin, lopressor, zofran, protonix, compazine, xarelto, prandin, micardis   Labs: glucose 181   Anthropometrics:   Height: 5'5.5" Weight: 133 lb 1.6 oz  UBW: 145-150 lb BMI: 21.81    NUTRITION DIAGNOSIS: Unintended wt loss related to cancer and associated treatments as evidenced by decreased interest in foods,    INTERVENTION:  Encouraged high protein snacks in between meals and at bedtime Discussed strategies for adding calories and protein to foods Recommend daily intake of Boost Plus/equivalent - ensure coupons provided Encouraged activity as able  Handouts provided Contact information given   MONITORING, EVALUATION, GOAL: Pt will tolerate increased calories and protein to minimize further wt loss    Next Visit: To be scheduled as needed. Pt has contact  information and encouraged to contact with nutrition questions/concerns

## 2023-07-02 ENCOUNTER — Encounter: Payer: Self-pay | Admitting: Endocrinology

## 2023-07-03 ENCOUNTER — Other Ambulatory Visit: Payer: Self-pay

## 2023-07-04 ENCOUNTER — Encounter: Payer: Self-pay | Admitting: Hematology

## 2023-07-08 ENCOUNTER — Ambulatory Visit (HOSPITAL_COMMUNITY)
Admission: RE | Admit: 2023-07-08 | Discharge: 2023-07-08 | Disposition: A | Discharge status: Home / Self Care | Payer: Medicare Other | Source: Ambulatory Visit | Attending: Nurse Practitioner | Admitting: Nurse Practitioner

## 2023-07-08 DIAGNOSIS — C23 Malignant neoplasm of gallbladder: Secondary | ICD-10-CM | POA: Diagnosis not present

## 2023-07-08 DIAGNOSIS — C249 Malignant neoplasm of biliary tract, unspecified: Secondary | ICD-10-CM | POA: Diagnosis not present

## 2023-07-08 DIAGNOSIS — I700 Atherosclerosis of aorta: Secondary | ICD-10-CM | POA: Diagnosis not present

## 2023-07-08 MED ORDER — SODIUM CHLORIDE (PF) 0.9 % IJ SOLN
INTRAMUSCULAR | Status: AC
  Filled 2023-07-08: qty 50

## 2023-07-08 MED ORDER — IOHEXOL 300 MG/ML  SOLN
100.0000 mL | Freq: Once | INTRAMUSCULAR | Status: AC | PRN
  Administered 2023-07-08: 100 mL via INTRAVENOUS

## 2023-07-08 MED ORDER — HEPARIN SOD (PORK) LOCK FLUSH 100 UNIT/ML IV SOLN
INTRAVENOUS | Status: AC
  Filled 2023-07-08: qty 5

## 2023-07-08 MED ORDER — HEPARIN SOD (PORK) LOCK FLUSH 100 UNIT/ML IV SOLN: 500.0000 [IU] | Freq: Once | INTRAVENOUS | Status: DC

## 2023-07-09 ENCOUNTER — Other Ambulatory Visit: Payer: Self-pay

## 2023-07-10 ENCOUNTER — Other Ambulatory Visit: Payer: Medicare Other

## 2023-07-10 ENCOUNTER — Other Ambulatory Visit: Payer: Self-pay

## 2023-07-10 ENCOUNTER — Ambulatory Visit: Payer: Self-pay | Admitting: Surgery

## 2023-07-10 ENCOUNTER — Encounter (HOSPITAL_COMMUNITY): Payer: Self-pay | Admitting: Surgery

## 2023-07-10 ENCOUNTER — Ambulatory Visit: Payer: Medicare Other | Admitting: Hematology

## 2023-07-10 ENCOUNTER — Ambulatory Visit (HOSPITAL_COMMUNITY)
Admission: RE | Admit: 2023-07-10 | Discharge: 2023-07-10 | Disposition: A | Discharge status: Home / Self Care | Payer: Medicare Other | Source: Ambulatory Visit | Attending: Hematology | Admitting: Hematology

## 2023-07-10 DIAGNOSIS — Z95828 Presence of other vascular implants and grafts: Secondary | ICD-10-CM | POA: Insufficient documentation

## 2023-07-10 DIAGNOSIS — C23 Malignant neoplasm of gallbladder: Secondary | ICD-10-CM | POA: Diagnosis not present

## 2023-07-10 DIAGNOSIS — C221 Intrahepatic bile duct carcinoma: Secondary | ICD-10-CM | POA: Insufficient documentation

## 2023-07-10 DIAGNOSIS — Z452 Encounter for adjustment and management of vascular access device: Secondary | ICD-10-CM | POA: Diagnosis not present

## 2023-07-10 NOTE — Progress Notes (Signed)
PCP - Shon Hale, MD  Cardiologist -  PPM/ICD - denies Device Orders - n/a Rep Notified - n/a  Chest x-ray - 07-10-23 EKG - 09-13-22 Stress Test - 05-23-06 ECHO - 03-22-20 Cardiac Cath - 03-22-20  CPAP - [email or web address]   Fasting Blood Sugar - 70-120 per patient Checks Blood Sugar daily  Blood Thinner Instructions: Xarelto stop today per patient per MD Aspirin Instructions: continue per patient  ERAS Protcol - clear liquids until 10:30 am. (Was told NPO per MD)  COVID TEST- no  Anesthesia review: Yes hx, Nonstemi, CABG x5 heart murmur, DM  Patient verbally denies any shortness of breath, fever, cough and chest pain during phone call   -------------  SDW INSTRUCTIONS given:  Your procedure is scheduled on OCTOBER 18,2024.  Report to [facility] Main Entrance "A" at 11:00 A.M., and check in at the Admitting office.  Call this number if you have problems the morning of surgery:  [phone]   Remember:  Do not eat after midnight the night before your surgery  You may drink clear liquids until 10:30 the morning of your surgery.   Clear liquids allowed are: Water, Non-Citrus Juices (without pulp), Carbonated Beverages, Clear Tea, Black Coffee Only, and Gatorade    Take these medicines the morning of surgery with A SIP OF WATER  atorvastatin (LIPITOR)  brimonidine (ALPHAGAN)  metoprolol tartrate (LOPRESSOR)  pantoprazole (PROTONIX)   IF NEEDED ondansetron (ZOFRAN)  prochlorperazine (COMPAZINE)  Propylene Glycol (SYSTANE BALANCE)   As of today, STOP taking any Aspirin (unless otherwise instructed by your surgeon) Aleve, Naproxen, Ibuprofen, Motrin, Advil, Goody's, BC's, all herbal medications, fish oil, and all vitamins.  WHAT DO I DO ABOUT MY DIABETES MEDICATION?   Do not take oral diabetes medicines (pills) the morning of surgery.  repaglinide (PRANDIN)  metFORMIN (GLUCOPHAGE)  JARDIANCE Last dose 07-10-23      THE MORNING OF SURGERY,DO NOT  take (HUMALOG  MIX 75/25 KWIKPEN) insulin.  The day of surgery, do not take other diabetes injectables, including Byetta (exenatide), Bydureon (exenatide ER), Victoza (liraglutide), or Trulicity (dulaglutide).  If your CBG is greater than 220 mg/dL, you may take  of your sliding scale (correction) dose of insulin.   HOW TO MANAGE YOUR DIABETES BEFORE AND AFTER SURGERY  Why is it important to control my blood sugar before and after surgery? Improving blood sugar levels before and after surgery helps healing and can limit problems. A way of improving blood sugar control is eating a healthy diet by:  Eating less sugar and carbohydrates  Increasing activity/exercise  Talking with your doctor about reaching your blood sugar goals High blood sugars (greater than 180 mg/dL) can raise your risk of infections and slow your recovery, so you will need to focus on controlling your diabetes during the weeks before surgery. Make sure that the doctor who takes care of your diabetes knows about your planned surgery including the date and location.  How do I manage my blood sugar before surgery? Check your blood sugar at least 4 times a day, starting 2 days before surgery, to make sure that the level is not too high or low.  Check your blood sugar the morning of your surgery when you wake up and every 2 hours until you get to the Short Stay unit.  If your blood sugar is less than 70 mg/dL, you will need to treat for low blood sugar: Do not take insulin. Treat a low blood sugar (less than 70 mg/dL) with  cup of clear juice (cranberry or apple), 4 glucose tablets, OR glucose gel. Recheck blood sugar in 15 minutes after treatment (to make sure it is greater than 70 mg/dL). If your blood sugar is not greater than 70 mg/dL on recheck, call [phone] for further instructions. Report your blood sugar to the short stay nurse when you get to Short Stay.  If you are admitted to the hospital after surgery: Your blood sugar  will be checked by the staff and you will probably be given insulin after surgery (instead of oral diabetes medicines) to make sure you have good blood sugar levels. The goal for blood sugar control after surgery is 80-180 mg/dL.                       Do not wear jewelry, make up, or nail polish            Do not wear lotions, powders, perfumes/colognes, or deodorant.            Do not shave 48 hours prior to surgery.  Men may shave face and neck.            Do not bring valuables to the hospital.            [facility] is not responsible for any belongings or valuables.  Do NOT Smoke (Tobacco/Vaping) 24 hours prior to your procedure If you use a CPAP at night, you may bring all equipment for your overnight stay.   Contacts, glasses, dentures or bridgework may not be worn into surgery.      For patients admitted to the hospital, discharge time will be determined by your treatment team.   Patients discharged the day of surgery will not be allowed to drive home, and someone needs to stay with them for 24 hours.    Special instructions:   [place]- Preparing For Surgery  Before surgery, you can play an important role. Because skin is not sterile, your skin needs to be as free of germs as possible. You can reduce the number of germs on your skin by washing with CHG (chlorahexidine gluconate) Soap before surgery.  CHG is an antiseptic cleaner which kills germs and bonds with the skin to continue killing germs even after washing.    Oral Hygiene is also important to reduce your risk of infection.  Remember - BRUSH YOUR TEETH THE MORNING OF SURGERY WITH YOUR REGULAR TOOTHPASTE  Please do not use if you have an allergy to CHG or antibacterial soaps. If your skin becomes reddened/irritated stop using the CHG.  Do not shave (including legs and underarms) for at least 48 hours prior to first CHG shower. It is OK to shave your face.  Please follow these instructions carefully.   Shower the  NIGHT BEFORE SURGERY and the MORNING OF SURGERY with DIAL Soap.   Pat yourself dry with a CLEAN TOWEL.  Wear CLEAN PAJAMAS to bed the night before surgery  Place CLEAN SHEETS on your bed the night of your first shower and DO NOT SLEEP WITH PETS.   Day of Surgery: Please shower morning of surgery  Wear Clean/Comfortable clothing the morning of surgery Do not apply any deodorants/lotions.   Remember to brush your teeth WITH YOUR REGULAR TOOTHPASTE.   Questions were answered. Patient verbalized understanding of instructions.

## 2023-07-10 NOTE — Assessment & Plan Note (Addendum)
cT4N0M0, stage III -Patient had 1 episode of abdominal pain in October 2023, and was found to have a large gallbladder stone. -She underwent laparoscopic attempted cholecystectomy but was not able to be resected due to adhesions to due to them in the colon. -She developed jaundice in June 2024, status post ERCP and stent placement in [place].  CT and MRI scan showed a liver mass adjacent to the gallbladder fossae, and a biopsy confirmed adenocarcinoma.  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  Unfortunately this was felt to be not resectable by Dr. Freida Busman. -I reviewed her CT chest from April 22, 2023, which showed no evidence of distant metastasis. -I recommended first-line cisplatin, gemcitabine  every 14 days and durvalumab every 28 days.  If she responded well, will stop chemo after 4 to 6 months of treatment, and changed to maintenance therapy. She started on 05/03/23 every 2 weeks and has been tolerating well.  We have also discussed option of consolidation radiation at some point. -restaging CT 07/08/2023 showed partial response in the primary site tumor with direct invasion to liver.  No new disease or question.  Will continue chemotherapy.

## 2023-07-10 NOTE — H&P (Signed)
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 Imaging was reviewed. Revision requested as patient remains on chemotherapy and needs access. Patient has been scheduled for portacath revision this Friday 10/18 at [facility]. I called her today and reviewed the plan for the procedure. She was instructed to hold her Xarelto starting today (this has previously been held for prior procedures without bridging) and to remain NPO after midnight Thursday night. All questions were answered.  Sophronia Simas, MD [facility] General, Hepatobiliary and Pancreatic Surgery 07/10/23 1:39 PM

## 2023-07-11 ENCOUNTER — Inpatient Hospital Stay (HOSPITAL_BASED_OUTPATIENT_CLINIC_OR_DEPARTMENT_OTHER): Payer: Medicare Other | Admitting: Hematology

## 2023-07-11 ENCOUNTER — Ambulatory Visit: Payer: Medicare Other

## 2023-07-11 ENCOUNTER — Inpatient Hospital Stay: Payer: Medicare Other

## 2023-07-11 VITALS — BP 141/63 | HR 72 | Temp 98.0°F | Resp 16 | Ht 65.0 in | Wt 133.5 lb

## 2023-07-11 DIAGNOSIS — E119 Type 2 diabetes mellitus without complications: Secondary | ICD-10-CM | POA: Diagnosis not present

## 2023-07-11 DIAGNOSIS — Z5112 Encounter for antineoplastic immunotherapy: Secondary | ICD-10-CM | POA: Diagnosis not present

## 2023-07-11 DIAGNOSIS — C23 Malignant neoplasm of gallbladder: Secondary | ICD-10-CM

## 2023-07-11 DIAGNOSIS — D649 Anemia, unspecified: Secondary | ICD-10-CM | POA: Diagnosis not present

## 2023-07-11 DIAGNOSIS — Z5111 Encounter for antineoplastic chemotherapy: Secondary | ICD-10-CM | POA: Diagnosis not present

## 2023-07-11 DIAGNOSIS — I10 Essential (primary) hypertension: Secondary | ICD-10-CM | POA: Diagnosis not present

## 2023-07-11 LAB — CBC WITH DIFFERENTIAL (CANCER CENTER ONLY)
Abs Immature Granulocytes: 0.05 10*3/uL (ref 0.00–0.07)
Basophils Absolute: 0.1 10*3/uL (ref 0.0–0.1)
Basophils Relative: 1 %
Eosinophils Absolute: 0.2 10*3/uL (ref 0.0–0.5)
Eosinophils Relative: 2 %
HCT: 29.7 % — ABNORMAL LOW (ref 36.0–46.0)
Hemoglobin: 9.9 g/dL — ABNORMAL LOW (ref 12.0–15.0)
Immature Granulocytes: 1 %
Lymphocytes Relative: 13 %
Lymphs Abs: 1.1 10*3/uL (ref 0.7–4.0)
MCH: 32.9 pg (ref 26.0–34.0)
MCHC: 33.3 g/dL (ref 30.0–36.0)
MCV: 98.7 fL (ref 80.0–100.0)
Monocytes Absolute: 1.1 10*3/uL — ABNORMAL HIGH (ref 0.1–1.0)
Monocytes Relative: 13 %
Neutro Abs: 6.1 10*3/uL (ref 1.7–7.7)
Neutrophils Relative %: 70 %
Platelet Count: 209 10*3/uL (ref 150–400)
RBC: 3.01 MIL/uL — ABNORMAL LOW (ref 3.87–5.11)
RDW: 20.6 % — ABNORMAL HIGH (ref 11.5–15.5)
WBC Count: 8.5 10*3/uL (ref 4.0–10.5)
nRBC: 0 % (ref 0.0–0.2)

## 2023-07-11 LAB — CMP (CANCER CENTER ONLY)
ALT: 20 U/L (ref 0–44)
AST: 27 U/L (ref 15–41)
Albumin: 3.8 g/dL (ref 3.5–5.0)
Alkaline Phosphatase: 132 U/L — ABNORMAL HIGH (ref 38–126)
Anion gap: 9 (ref 5–15)
BUN: 20 mg/dL (ref 8–23)
CO2: 28 mmol/L (ref 22–32)
Calcium: 9.8 mg/dL (ref 8.9–10.3)
Chloride: 103 mmol/L (ref 98–111)
Creatinine: 1.12 mg/dL — ABNORMAL HIGH (ref 0.44–1.00)
GFR, Estimated: 49 mL/min — ABNORMAL LOW (ref >60)
Glucose, Bld: 198 mg/dL — ABNORMAL HIGH (ref 70–99)
Potassium: 4.6 mmol/L (ref 3.5–5.1)
Sodium: 140 mmol/L (ref 135–145)
Total Bilirubin: 0.4 mg/dL (ref 0.3–1.2)
Total Protein: 6.9 g/dL (ref 6.5–8.1)

## 2023-07-11 LAB — TSH: TSH: 1.678 u[IU]/mL (ref 0.350–4.500)

## 2023-07-11 LAB — MAGNESIUM: Magnesium: 1.8 mg/dL (ref 1.7–2.4)

## 2023-07-11 MED FILL — Fosaprepitant Dimeglumine For IV Infusion 150 MG (Base Eq): INTRAVENOUS | Qty: 5 | Status: AC

## 2023-07-11 NOTE — Progress Notes (Signed)
[facility]   [phone] Fax:([phone]   Clinic Follow up Note   Patient Care Team: Shon Hale, MD as PCP - General (Family Medicine) Runell Gess, MD as PCP - Cardiology (Cardiology) Malachy Mood, MD as Consulting Physician (Oncology)  Date of Service:  07/11/2023  CHIEF COMPLAINT: f/u of gallbladder  CURRENT THERAPY:  First-line chemotherapy cisplatin and gemcitabine every 14 days, durvalumab every 28 days  Oncology History   Gallbladder cancer (HCC) cT4N0M0, stage III -Patient had 1 episode of abdominal pain in October 2023, and was found to have a large gallbladder stone. -She underwent laparoscopic attempted cholecystectomy but was not able to be resected due to adhesions to due to them in the colon. -She developed jaundice in June 2024, status post ERCP and stent placement in CBD.  CT and MRI scan showed a liver mass adjacent to the gallbladder fossae, and a biopsy confirmed adenocarcinoma.  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  Unfortunately this was felt to be not resectable by Dr. Freida Busman. -I reviewed her CT chest from April 22, 2023, which showed no evidence of distant metastasis. -I recommended first-line cisplatin, gemcitabine  every 14 days and durvalumab every 28 days.  If she responded well, will stop chemo after 4 to 6 months of treatment, and changed to maintenance therapy. She started on 05/03/23 every 2 weeks and has been tolerating well.  We have also discussed option of consolidation radiation at some point. -restaging CT 07/08/2023 showed partial response in the primary site tumor with direct invasion to liver.  No new disease or question.  Will continue chemotherapy. -We again reviewed the role of consolidation radiation, versus maintenance durvalumab.  I will also check with Dr. Freida Busman and liver surgeon at [facility] to  see if it's worth of exploring the possibility of surgery.    Assessment and Plan    Unresectable Gallbladder Cancer Tumor has decreased in size with current chemotherapy regimen. No new lesions. Patient tolerating treatment well. Discussed potential surgical intervention and radiation therapy. -Continue current chemotherapy regimen. -Consult with surgeon at [facility] regarding potential surgical intervention. -Reschedule chemotherapy to 07/17/2023 due to port revision.  Port Revision Current port placement too high, risking incorrect venous access. Scheduled for revision on 07/12/2023. -Proceed with port revision on 07/12/2023.  Thyroid Function Slight elevation in T4, likely due to immunotherapy. No symptoms of hyperthyroidism reported. -Monitor thyroid function, no intervention needed at this time.  Anemia Mild anemia noted, no need for transfusion. -Monitor anemia, no intervention needed at this time.  Kidney Function Slight concern for hydration status, patient advised to increase water intake. -Advise patient to increase water intake, especially post-contrast for CT scan.  Plan -Port revision by Dr. Freida Busman this Friday -Will reschedule her chemotherapy to next Wednesday due to the port revision -Continue chemotherapy every 2 weeks, future chemotherapy will be on Fridays, and she will come in for lab and follow-up the day before chemo        SUMMARY OF ONCOLOGIC HISTORY: Oncology History Overview Note   Cancer Staging  Gallbladder cancer [facility]) Staging form: Distal Bile Duct, AJCC 8th Edition - Clinical stage from 04/09/2023: Stage IIIB (cT4, cN0, cM0) - Signed by Malachy Mood, MD on 04/18/2023 Total positive nodes: 0 Histologic grade (G): G2 Histologic grading system: 3 grade system     Gallbladder cancer (HCC)  04/04/2023 Imaging    IMPRESSION:  1. Ill-defined hypodensity in the region of the gallbladder fossa appears similar to the prior study given lack of contrast.  This would be better evaluated with dedicated MRI. 2. Nonobstructing right renal calculus. 3. Large amount of stool throughout the colon. 4. Stable indeterminate lucent areas in the vertebral bodies of the lumbar spine. 5. Severe atherosclerotic disease.     04/09/2023 Pathology Results     FINAL MICROSCOPIC DIAGNOSIS:   A. LIVER, ADJACENT TO GALLBLADDER FOSSA, BIOPSY:  - Moderately differentiated adenocarcinoma    04/09/2023 Cancer Staging   Staging form: Distal Bile Duct, AJCC 8th Edition - Clinical stage from 04/09/2023: Stage IIIB (cT4, cN0, cM0) - Signed by Malachy Mood, MD on 04/18/2023 Total positive nodes: 0 Histologic grade (G): G2 Histologic grading system: 3 grade system   04/18/2023 Initial Diagnosis   Cholangiocarcinoma of liver (HCC)   05/03/2023 -  Chemotherapy   Patient is on Treatment Plan : BILIARY TRACT Cisplatin + Gemcitabine D1,15 + Durvalumab (1500) D1 q28d / Durvalumab (1500) q28d        Discussed the use of AI scribe software for clinical note transcription with the patient, who gave verbal consent to proceed.  History of Present Illness   The patient is an 82 year old with a history of unresectable gallbladder cancer, currently undergoing chemotherapy. The patient's port, used for chemotherapy administration, was found to be slightly too high and is scheduled for revision. The patient reports no nausea or other significant side effects from the chemotherapy.  The patient recently had a scan, which showed that the cancer, while still present, has decreased in size. The patient also has a stent in the bile duct, which appears to be functioning well. The patient's blood tests show some abnormalities, including slightly elevated thyroid function and a slightly low magnesium level, but overall, the patient's condition appears to be stable. The patient is active and enjoys playing pool.         All other systems were reviewed with the patient and are  negative.  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 There is a small calcified mass in the L:A near the LA appendage. No valvular masses seen with associated mitral annular calcification. LA Volume/ BSA27.4 ml/m2 No AS. Right ventricular systolic pressure is elevated at .   Jaundice    Myocardial infarction [facility])    June 2021   Neuromuscular disorder (HCC)    neuropathy in bilateral feet   Peripheral vascular disease (HCC)    Pneumonia    not hosp.    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  PTCAs with drug coated balloon Lt ant tibial artery and Lt popliteal artery  11/26/2019    SURGICAL HISTORY: Past Surgical History:  Procedure Laterality Date   ABDOMINAL AORTAGRAM  11/25/2019   ABDOMINAL AORTOGRAM  W/LOWER EXTREMITY    ABDOMINAL AORTOGRAM N/A 06/15/2019   Procedure: ABDOMINAL AORTOGRAM;  Surgeon: Runell Gess, MD;  Location: [facility] INVASIVE CV LAB;  Service: Cardiovascular;  Laterality: N/A;   ABDOMINAL AORTOGRAM W/LOWER EXTREMITY N/A 11/25/2019   Procedure: ABDOMINAL AORTOGRAM W/LOWER EXTREMITY;  Surgeon: Iran Ouch, MD;  Location: [facility] INVASIVE CV LAB;  Service: Cardiovascular;  Laterality: N/A;  Lt leg   ABDOMINAL AORTOGRAM W/LOWER EXTREMITY N/A 08/01/2020   Procedure: ABDOMINAL AORTOGRAM W/LOWER EXTREMITY;  Surgeon: Runell Gess, MD;  Location: [facility] INVASIVE CV LAB;   Service: Cardiovascular;  Laterality: N/A;   ABDOMINAL AORTOGRAM W/LOWER EXTREMITY N/A 04/28/2021   Procedure: ABDOMINAL AORTOGRAM W/LOWER EXTREMITY;  Surgeon: Sherren Kerns, MD;  Location: [facility] INVASIVE CV LAB;  Service: Cardiovascular;  Laterality: N/A;   ABDOMINAL AORTOGRAM W/LOWER EXTREMITY N/A 10/13/2021   Procedure: ABDOMINAL AORTOGRAM W/LOWER EXTREMITY;  Surgeon: Leonie Douglas, MD;  Location: [facility] INVASIVE CV LAB;  Service: Cardiovascular;  Laterality: N/A;   ABDOMINAL AORTOGRAM W/LOWER EXTREMITY N/A 11/09/2022   Procedure: ABDOMINAL AORTOGRAM W/LOWER EXTREMITY;  Surgeon: Leonie Douglas, MD;  Location: [facility] INVASIVE CV LAB;  Service: Cardiovascular;  Laterality: N/A;   ABDOMINAL AORTOGRAM W/LOWER EXTREMITY N/A 11/16/2022   Procedure: ABDOMINAL AORTOGRAM W/LOWER EXTREMITY;  Surgeon: Leonie Douglas, MD;  Location: [facility] INVASIVE CV LAB;  Service: Cardiovascular;  Laterality: N/A;   AMPUTATION TOE Left 05/03/2021   Procedure: AMPUTATION OF THIRD LEFT  TOE;  Surgeon: Leonie Douglas, MD;  Location: [facility] OR;  Service: Vascular;  Laterality: Left;   APPENDECTOMY     APPLICATION OF WOUND VAC Right 07/21/2019   Procedure: Application Of Prevena Wound Vac Right Groin;  Surgeon: Nada Libman, MD;  Location: [facility] OR;  Service: Vascular;  Laterality: Right;   BILIARY BRUSHING  04/07/2023   Procedure: BILIARY BRUSHING;  Surgeon: Vida Rigger, MD;  Location: [facility] ENDOSCOPY;  Service: Gastroenterology;;   BILIARY STENT PLACEMENT  04/07/2023   Procedure: BILIARY STENT PLACEMENT;  Surgeon: Vida Rigger, MD;  Location: [facility] ENDOSCOPY;  Service: Gastroenterology;;   CARPAL TUNNEL RELEASE Left    CARPAL TUNNEL RELEASE Right    CESAREAN SECTION     x 2   CHOLECYSTECTOMY N/A 10/12/2022   Procedure: LAPAROSCOPIC PARTIAL FENESTRATING CHOLECYSTECTOMY;  Surgeon: Fritzi Mandes, MD;  Location[facility] OR;  Service: General;  Laterality: N/A;   COLONOSCOPY     CORONARY ARTERY BYPASS GRAFT N/A 03/24/2020   Procedure:  CORONARY ARTERY BYPASS GRAFTING (CABG) using LIMA to LAD (m); RIMA to RAMUS; Endoscopic Right Greater Saphenous Vein: SVG to Diag1; SVG to PLB (right); and SVG to PL (left).;  Surgeon: Linden Dolin, MD;  Location: [facility] OR;  Service: Open Heart Surgery;  Laterality: N/A;  BILATERAL IMA   ENDARTERECTOMY FEMORAL Right 07/21/2019   Procedure: RIGHT ENDARTERECTOMY FEMORAL WITH PATCH ANGIOPLASTY;  Surgeon: Nada Libman, MD;  Location: [facility] OR;  Service: Vascular;  Laterality: Right;   ENDARTERECTOMY FEMORAL Right 08/16/2020   Procedure: RIGHT FEMORAL ENDARTERECTOMY;  Surgeon: Maeola Harman, MD;  Location[facility] OR;  Service: Vascular;  Laterality: Right;   ENDOVEIN HARVEST OF GREATER SAPHENOUS VEIN Right 03/24/2020   Procedure: ENDOVEIN HARVEST OF GREATER SAPHENOUS VEIN;  Surgeon: Linden Dolin, MD;  Location[facility] OR;  Service: Open Heart Surgery;  Laterality: Right;   ERCP N/A 04/07/2023   Procedure: ENDOSCOPIC RETROGRADE CHOLANGIOPANCREATOGRAPHY (ERCP);  Surgeon: Vida Rigger, MD;  Location: [facility] ENDOSCOPY;  Service: Gastroenterology;  Laterality: N/A;   EYE SURGERY  cataract removal bilaterally   FEMORAL-POPLITEAL BYPASS GRAFT Right 08/16/2020   Procedure: BYPASS GRAFT FEMORAL-POPLITEAL ARTERY;  Surgeon: Maeola Harman, MD;  Location: [facility] OR;  Service: Vascular;  Laterality: Right;   FEMORAL-POPLITEAL BYPASS GRAFT Left 05/03/2021   Procedure: LEFT FEMORAL TO BELOW KNEE POPLITEAL ARTERY BYPASS GRAFTING WITH 6MMX80 PTFE GRAFT;  Surgeon: Leonie Douglas, MD;  Location[facility] OR;  Service: Vascular;  Laterality: Left;   LEFT HEART CATH AND CORONARY ANGIOGRAPHY N/A 03/22/2020   Procedure: LEFT HEART CATH AND CORONARY ANGIOGRAPHY;  Surgeon: Swaziland, Peter M, MD;  Location: [facility] INVASIVE CV LAB;  Service: Cardiovascular;  Laterality: N/A;   LOWER EXTREMITY ANGIOGRAPHY Bilateral 06/15/2019   Procedure: Lower Extremity Angiography;  Surgeon: Runell Gess, MD;  Location: [facility] INVASIVE CV  LAB;  Service: Cardiovascular;  Laterality: Bilateral;   LOWER EXTREMITY ANGIOGRAPHY Right 08/03/2019   Procedure: LOWER EXTREMITY ANGIOGRAPHY;  Surgeon: Runell Gess, MD;  [facility] INVASIVE CV LAB;  Service: Cardiovascular;  Laterality: Right;   LOWER EXTREMITY ANGIOGRAPHY N/A 09/07/2021   Procedure: LOWER EXTREMITY ANGIOGRAPHY;  Surgeon: Cephus Shelling, MD;  [facility] INVASIVE CV LAB;  Service: Cardiovascular;  Laterality: N/A;   PATCH ANGIOPLASTY Right 07/21/2019   Procedure: Patch Angioplasty Right Femoral Artery;  Surgeon: Nada Libman, MD;  Location: [name] OR;  Service: Vascular;  Laterality: Right;   PERIPHERAL INTRAVASCULAR LITHOTRIPSY  11/25/2019   Procedure: INTRAVASCULAR LITHOTRIPSY;  Surgeon: Iran Ouch, MD;  [facility] INVAS[facility] CV LAB;  Service: Cardiovascular;;  LT. SFA   PERIPHERAL VASCULAR ATHERECTOMY  08/03/2019   Procedure: PERIPHERAL VASCULAR ATHERECTOMY;  Surgeon: Runell Gess, MD;  Location: [facility] INVASIVE CV LAB;  Service: Cardiovascular;;  right SFA, right TP trunk   PERIPHERAL VASCULAR ATHERECTOMY  11/25/2019   Procedure: PERIPHERAL VASCULAR ATHERECTOMY;  Surgeon: Iran Ouch, MD;  [facility] INVASIVE CV LAB;  Service: Cardiovascular;;  Lt.  POPLITEAL and AT   PERIPHERAL VASCULAR BALLOON ANGIOPLASTY Left 06/15/2019   Procedure: PERIPHERAL VASCULAR BALLOON ANGIOPLASTY;  Surgeon: Runell Gess, MD;  [facility] INVASIVE CV LAB;  Service: Cardiovascular;  Laterality: Left;  SFA UNSUCCESSFUL UNABLE TO CROSS LESION   PERIPHERAL VASCULAR BALLOON ANGIOPLASTY  08/03/2019   Procedure: PERIPHERAL VASCULAR BALLOON ANGIOPLASTY;  Surgeon: Runell Gess, MD;  [facility] INVAS[facility] CV LAB;  Service: Cardiovascular;;  right SFA, Right TP trunk   PERIPHERAL VASCULAR BALLOON ANGIOPLASTY  09/07/2021   Procedure: PERIPHERAL VASCULAR BALLOON ANGIOPLASTY;  Surgeon: Cephus Shelling, MD;  [facility] INVAS[facility] CV LAB;  Service: Cardiovascular;;    PERIPHERAL VASCULAR BALLOON ANGIOPLASTY Right 10/13/2021   Procedure: PERIPHERAL VASCULAR BALLOON ANGIOPLASTY;  Surgeon: Leonie Douglas, MD;  [facility] INVASIVE CV LAB;  Service: Cardiovascular;  Laterality: Right;   PERIPHERAL VASCULAR BALLOON ANGIOPLASTY  11/09/2022   Procedure: PERIPHERAL VASCULAR BALLOON ANGIOPLASTY;  Surgeon: Leonie Douglas, MD;  [facility]: [facility] INVASIVE CV LAB;  Service: Cardiovascular;;  fem-pop bypass and AT   PORTACATH PLACEMENT Left 04/29/2023   Procedure: INSERTION PORT-A-CATH;  Surgeon: Fritzi Mandes, MD;  Location: [facility] ORS;  Service: General;  Laterality: Left;   REMOVAL OF STONES  04/07/2023   Procedure: REMOVAL OF STONES;  Surgeon: Vida Rigger, MD;  Location: [facility] ENDOSCOPY;  Service: Gastroenterology;;   Dennison Mascot  04/07/2023   Procedure: Dennison Mascot;  Surgeon: Vida Rigger, MD;  Location: [facility] ENDOSCOPY;  Service: Gastroenterology;;   TEE WITHOUT CARDIOVERSION N/A 03/24/2020   Procedure: TRANSESOPHAGEAL ECHOCARDIOGRAM (TEE);  Surgeon: Linden Dolin, MD;  Location:  [facility] OR;  Service: Open Heart Surgery;  Laterality: N/A;   TONSILLECTOMY     and adenoidectomy   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/2019   Procedure: TRANSMETATARSAL AMPUTATION;  Surgeon: Vivi Barrack, DPM;  Location: [facility] OR;  Service: Podiatry;  Laterality: Right;   TRANSMETATARSAL AMPUTATION Left 05/11/2021   Procedure: LEFT TRANSMETATARSAL AMPUTATION;  Surgeon: Maeola Harman, MD;  Location: [facility] OR;  Service: Vascular;  Laterality: Left;   TUBAL LIGATION      I have reviewed the social history and family history with the patient and they are unchanged from previous note.  ALLERGIES:  is allergic to versed [midazolam], augmentin [amoxicillin-pot clavulanate], bactrim [sulfamethoxazole-trimethoprim], demerol [meperidine], meperidine hcl, scopolamine, and tramadol.  MEDICATIONS:  Current Outpatient Medications  Medication Sig Dispense Refill   aspirin EC 81 MG tablet Take  81 mg by mouth at bedtime. Swallow whole.     atorvastatin (LIPITOR) 80 MG tablet Take 1 tablet (80 mg total) by mouth daily. 90 tablet 3   brimonidine (ALPHAGAN) 0.2 % ophthalmic solution Place 1 drop into the right eye 2 (two) times daily.     hydrochlorothiazide (HYDRODIURIL) 12.5 MG tablet Take 1 tablet (12.5 mg total) by mouth daily. 90 tablet 0   Insulin Lispro Prot & Lispro (HUMALOG MIX 75/25 KWIKPEN) (75-25) 100 UNIT/ML Kwikpen INJECT 8 UNITS SUBCUTANEOUSLY ONCE DAILY WITH BREAKFAST 15 mL 4   Insulin Pen Needle (ULTRA-THIN II MINI PEN NEEDLE) 31G X 5 MM MISC USE AS DIRECTED 50 each 2   JARDIANCE 25 MG TABS tablet TAKE 1 TABLET BY MOUTH ONCE DAILY BEFORE BREAKFAST 90 tablet 3   Lancets (ONETOUCH DELICA PLUS LANCET33G) MISC Use to check blood sugars twice daily. (Patient taking differently: Use to check blood sugars  daily.) 100 each 5   lidocaine-prilocaine (EMLA) cream Apply to affected area once (Patient taking differently: Apply 1 Application topically as needed (port).) 30 g 3   magnesium oxide (MAG-OX) 400 (240 Mg) MG tablet Take 1 tablet (400 mg total) by mouth 2 (two) times daily. 60 tablet 1   metFORMIN (GLUCOPHAGE) 1000 MG tablet Take 1 tablet by mouth twice daily 180 tablet 0   metoprolol tartrate (LOPRESSOR) 50 MG tablet Take 1 tablet (50 mg total) by mouth 2 (two) times daily. 180 tablet 0   Multiple Vitamins-Minerals (MULTI FOR HER PO) Take 1 tablet by mouth daily.     mupirocin ointment (BACTROBAN) 2 % Apply 1 Application topically 2 (two) times daily. (Patient taking differently: Apply 1 Application topically 2 (two) times daily as needed (wound care for toes).) 30 g 2   ondansetron (ZOFRAN) 8 MG tablet Take 1 tablet (8 mg total) by mouth every 8 (eight) hours as needed for nausea or vomiting. Start on the third day after cisplatin. 30 tablet 1   ONETOUCH VERIO test strip USE TO CHECK BLOOD SUGARS ONCE DAILY 100 each 2   pantoprazole (PROTONIX) 40 MG tablet Take 1 tablet by  mouth once daily 90 tablet 3   prochlorperazine (COMPAZINE) 10 MG tablet Take 1 tablet (10 mg total) by mouth every 6 (six) hours as needed (Nausea or vomiting). 30 tablet 1   Propylene Glycol (SYSTANE BALANCE) 0.6 % SOLN Place 1 drop into the left eye 3 (three) times daily as needed (dry/irritated eyes).     repaglinide (PRANDIN) 2 MG tablet Take 1 tab by breakfast and 1 tab before dinner 180 tablet 2   rivaroxaban (XARELTO) 2.5 MG TABS tablet Take 1 tablet by mouth twice  daily 60 tablet 11   telmisartan (MICARDIS) 80 MG tablet Take 1 tablet by mouth once daily 90 tablet 3   No current facility-administered medications for this visit.    PHYSICAL EXAMINATION: ECOG PERFORMANCE STATUS: 1 - Symptomatic but completely ambulatory  Vitals:   07/11/23 1129  BP: (!) 141/63  Pulse: 72  Resp: 16  Temp: 98 F (36.7 C)  SpO2: 100%   Wt Readings from Last 3 Encounters:  07/10/23 133 lb 2.5 oz (60.4 kg)  07/11/23 133 lb 8 oz (60.6 kg)  06/27/23 133 lb 1.6 oz (60.4 kg)     GENERAL:alert, no distress and comfortable SKIN: skin color, texture, turgor are normal, no rashes or significant lesions EYES: normal, Conjunctiva are pink and non-injected, sclera clear NECK: supple, thyroid normal size, non-tender, without nodularity LYMPH:  no palpable lymphadenopathy in the cervical, axillary  LUNGS: clear to auscultation and percussion with normal breathing effort HEART: regular rate & rhythm and no murmurs and no lower extremity edema ABDOMEN:abdomen soft, non-tender and normal bowel sounds Musculoskeletal:no cyanosis of digits and no clubbing  NEURO: alert & oriented x 3 with fluent speech, no focal motor/sensory deficits    LABORATORY DATA:  I have reviewed the data as listed    Latest Ref Rng & Units 07/11/2023   10:59 AM 06/27/2023    7:51 AM 06/13/2023    8:39 AM  CBC  WBC 4.0 - 10.5 K/uL 8.5  10.6  12.9   Hemoglobin 12.0 - 15.0 g/dL 9.9  9.3  9.5   Hematocrit 36.0 - 46.0 % 29.7  28.8   30.2   Platelets 150 - 400 K/uL 209  254  397         Latest Ref Rng & Units 07/11/2023   10:59 AM 06/27/2023    7:51 AM 06/21/2023    9:20 AM  CMP  Glucose 70 - 99 mg/dL 829  562  130   BUN 8 - 23 mg/dL 20  21  22    Creatinine 0.44 - 1.00 mg/dL 8.65  7.84  6.96   Sodium 135 - 145 mmol/L 140  138  141   Potassium 3.5 - 5.1 mmol/L 4.6  4.1  5.1   Chloride 98 - 111 mmol/L 103  101  101   CO2 22 - 32 mmol/L 28  28  29    Calcium 8.9 - 10.3 mg/dL 9.8  29.5  28.4   Total Protein 6.5 - 8.1 g/dL 6.9  6.6    Total Bilirubin 0.3 - 1.2 mg/dL 0.4  0.5    Alkaline Phos 38 - 126 U/L 132  153    AST 15 - 41 U/L 27  32    ALT 0 - 44 U/L 20  30        RADIOGRAPHIC STUDIES: I have personally reviewed the radiological images as listed and agreed with the findings in the report. DG Chest 1 View  Result Date: 07/10/2023 CLINICAL DATA:  Assess port position EXAM: CHEST  1 VIEW COMPARISON:  04/29/2023 FINDINGS: Power port in place in the left which passes through the subclavian vein with the tip in the SVC just below the azygous level, several cm above the right atrium. Previous median sternotomy and CABG. No pneumothorax. The vascularity is normal in the lungs are clear. No effusions. IMPRESSION: Power port in place in the left which passes through the subclavian vein with the tip in the SVC just below the azygous level, several cm above the right atrium.  Electronically Signed   By: Paulina Fusi M.D.   On: 07/10/2023 13:51      Orders Placed This Encounter  Procedures   CBC with Differential (Cancer Center Only)    Standing Status:   Future    Standing Expiration Date:   07/16/2024   CMP (Cancer Center only)    Standing Status:   Future    Standing Expiration Date:   07/16/2024   T4    Standing Status:   Future    Standing Expiration Date:   07/16/2024   TSH    Standing Status:   Future    Standing Expiration Date:   07/16/2024   Magnesium    Standing Status:   Future    Standing  Expiration Date:   07/16/2024   CBC with Differential (Cancer Center Only)    Standing Status:   Future    Standing Expiration Date:   08/29/2024   CMP (Cancer Center only)    Standing Status:   Future    Standing Expiration Date:   08/29/2024   Magnesium    Standing Status:   Future    Standing Expiration Date:   08/29/2024   CBC with Differential (Cancer Center Only)    Standing Status:   Future    Standing Expiration Date:   09/12/2024   CMP (Cancer Center only)    Standing Status:   Future    Standing Expiration Date:   09/12/2024   T4    Standing Status:   Future    Standing Expiration Date:   09/12/2024   TSH    Standing Status:   Future    Standing Expiration Date:   09/12/2024   Magnesium    Standing Status:   Future    Standing Expiration Date:   09/12/2024   All questions were answered. The patient knows to call the clinic with any problems, questions or concerns. No barriers to learning was detected. The total time spent in the appointment was 30 minutes.     Malachy Mood, MD 07/11/2023

## 2023-07-12 ENCOUNTER — Ambulatory Visit (HOSPITAL_BASED_OUTPATIENT_CLINIC_OR_DEPARTMENT_OTHER): Payer: Medicare Other

## 2023-07-12 ENCOUNTER — Ambulatory Visit (HOSPITAL_COMMUNITY): Payer: Medicare Other

## 2023-07-12 ENCOUNTER — Encounter (HOSPITAL_COMMUNITY)
Admission: RE | Disposition: A | Discharge status: Home / Self Care | Payer: Self-pay | Source: Home / Self Care | Attending: Surgery

## 2023-07-12 ENCOUNTER — Other Ambulatory Visit: Payer: Self-pay

## 2023-07-12 ENCOUNTER — Ambulatory Visit (HOSPITAL_COMMUNITY)
Admission: RE | Admit: 2023-07-12 | Discharge: 2023-07-12 | Disposition: A | Discharge status: Home / Self Care | Payer: Medicare Other | Attending: Surgery | Admitting: Surgery

## 2023-07-12 ENCOUNTER — Inpatient Hospital Stay: Payer: Medicare Other

## 2023-07-12 ENCOUNTER — Encounter (HOSPITAL_COMMUNITY): Payer: Self-pay | Admitting: Surgery

## 2023-07-12 ENCOUNTER — Ambulatory Visit: Payer: Medicare Other

## 2023-07-12 DIAGNOSIS — E785 Hyperlipidemia, unspecified: Secondary | ICD-10-CM | POA: Insufficient documentation

## 2023-07-12 DIAGNOSIS — K219 Gastro-esophageal reflux disease without esophagitis: Secondary | ICD-10-CM | POA: Insufficient documentation

## 2023-07-12 DIAGNOSIS — Z452 Encounter for adjustment and management of vascular access device: Secondary | ICD-10-CM

## 2023-07-12 DIAGNOSIS — Z79899 Other long term (current) drug therapy: Secondary | ICD-10-CM | POA: Insufficient documentation

## 2023-07-12 DIAGNOSIS — Z9049 Acquired absence of other specified parts of digestive tract: Secondary | ICD-10-CM | POA: Insufficient documentation

## 2023-07-12 DIAGNOSIS — Z800 Family history of malignant neoplasm of digestive organs: Secondary | ICD-10-CM | POA: Diagnosis not present

## 2023-07-12 DIAGNOSIS — Z794 Long term (current) use of insulin: Secondary | ICD-10-CM | POA: Diagnosis not present

## 2023-07-12 DIAGNOSIS — M1990 Unspecified osteoarthritis, unspecified site: Secondary | ICD-10-CM | POA: Diagnosis not present

## 2023-07-12 DIAGNOSIS — I10 Essential (primary) hypertension: Secondary | ICD-10-CM | POA: Diagnosis not present

## 2023-07-12 DIAGNOSIS — X58XXXA Exposure to other specified factors, initial encounter: Secondary | ICD-10-CM | POA: Insufficient documentation

## 2023-07-12 DIAGNOSIS — Z87891 Personal history of nicotine dependence: Secondary | ICD-10-CM | POA: Diagnosis not present

## 2023-07-12 DIAGNOSIS — I2510 Atherosclerotic heart disease of native coronary artery without angina pectoris: Secondary | ICD-10-CM | POA: Diagnosis not present

## 2023-07-12 DIAGNOSIS — I252 Old myocardial infarction: Secondary | ICD-10-CM | POA: Diagnosis not present

## 2023-07-12 DIAGNOSIS — T85628A Displacement of other specified internal prosthetic devices, implants and grafts, initial encounter: Secondary | ICD-10-CM | POA: Diagnosis not present

## 2023-07-12 DIAGNOSIS — Z4682 Encounter for fitting and adjustment of non-vascular catheter: Secondary | ICD-10-CM | POA: Diagnosis not present

## 2023-07-12 DIAGNOSIS — D649 Anemia, unspecified: Secondary | ICD-10-CM | POA: Diagnosis not present

## 2023-07-12 DIAGNOSIS — K449 Diaphragmatic hernia without obstruction or gangrene: Secondary | ICD-10-CM | POA: Diagnosis not present

## 2023-07-12 DIAGNOSIS — E1151 Type 2 diabetes mellitus with diabetic peripheral angiopathy without gangrene: Secondary | ICD-10-CM | POA: Insufficient documentation

## 2023-07-12 DIAGNOSIS — Z951 Presence of aortocoronary bypass graft: Secondary | ICD-10-CM | POA: Insufficient documentation

## 2023-07-12 DIAGNOSIS — C23 Malignant neoplasm of gallbladder: Secondary | ICD-10-CM | POA: Diagnosis not present

## 2023-07-12 DIAGNOSIS — Z7901 Long term (current) use of anticoagulants: Secondary | ICD-10-CM | POA: Diagnosis not present

## 2023-07-12 DIAGNOSIS — Z7984 Long term (current) use of oral hypoglycemic drugs: Secondary | ICD-10-CM | POA: Insufficient documentation

## 2023-07-12 DIAGNOSIS — E1142 Type 2 diabetes mellitus with diabetic polyneuropathy: Secondary | ICD-10-CM | POA: Insufficient documentation

## 2023-07-12 DIAGNOSIS — Z7982 Long term (current) use of aspirin: Secondary | ICD-10-CM | POA: Diagnosis not present

## 2023-07-12 DIAGNOSIS — T82898A Other specified complication of vascular prosthetic devices, implants and grafts, initial encounter: Secondary | ICD-10-CM | POA: Diagnosis not present

## 2023-07-12 HISTORY — PX: PORTACATH PLACEMENT: SHX2246

## 2023-07-12 LAB — T4: T4, Total: 14.1 ug/dL — ABNORMAL HIGH (ref 4.5–12.0)

## 2023-07-12 LAB — GLUCOSE, CAPILLARY
Glucose-Capillary: 127 mg/dL — ABNORMAL HIGH (ref 70–99)
Glucose-Capillary: 101 mg/dL — ABNORMAL HIGH (ref 70–99)
Glucose-Capillary: 101 mg/dL — ABNORMAL HIGH (ref 70–99)

## 2023-07-12 SURGERY — INSERTION, TUNNELED CENTRAL VENOUS DEVICE, WITH PORT
Anesthesia: General

## 2023-07-12 MED ORDER — BUPIVACAINE-EPINEPHRINE (PF) 0.25% -1:200000 IJ SOLN
INTRAMUSCULAR | Status: AC
  Filled 2023-07-12: qty 30

## 2023-07-12 MED ORDER — LACTATED RINGERS IV SOLN: INTRAVENOUS | Status: DC

## 2023-07-12 MED ORDER — ONDANSETRON HCL 4 MG/2ML IJ SOLN
INTRAMUSCULAR | Status: AC
  Filled 2023-07-12: qty 2

## 2023-07-12 MED ORDER — FENTANYL CITRATE (PF) 250 MCG/5ML IJ SOLN
INTRAMUSCULAR | Status: DC | PRN
  Administered 2023-07-12: 50 ug via INTRAVENOUS
  Administered 2023-07-12: 25 ug via INTRAVENOUS
  Administered 2023-07-12: 50 ug via INTRAVENOUS

## 2023-07-12 MED ORDER — HEPARIN 6000 UNIT IRRIGATION SOLUTION
Status: DC | PRN
  Administered 2023-07-12: 1

## 2023-07-12 MED ORDER — PROPOFOL 10 MG/ML IV BOLUS
INTRAVENOUS | Status: DC | PRN
  Administered 2023-07-12: 100 ug/kg/min via INTRAVENOUS
  Administered 2023-07-12: 100 mg via INTRAVENOUS

## 2023-07-12 MED ORDER — CEFAZOLIN SODIUM-DEXTROSE 2-4 GM/100ML-% IV SOLN
2.0000 g | INTRAVENOUS | Status: AC
  Administered 2023-07-12: 2 g via INTRAVENOUS
  Filled 2023-07-12: qty 100

## 2023-07-12 MED ORDER — HEPARIN SOD (PORK) LOCK FLUSH 100 UNIT/ML IV SOLN
INTRAVENOUS | Status: DC | PRN
  Administered 2023-07-12: 500 [IU] via INTRAVENOUS

## 2023-07-12 MED ORDER — 0.9 % SODIUM CHLORIDE (POUR BTL) OPTIME
TOPICAL | Status: DC | PRN
  Administered 2023-07-12: 1000 mL

## 2023-07-12 MED ORDER — LIDOCAINE 2% (20 MG/ML) 5 ML SYRINGE
INTRAMUSCULAR | Status: AC
  Filled 2023-07-12: qty 5

## 2023-07-12 MED ORDER — BUPIVACAINE-EPINEPHRINE 0.25% -1:200000 IJ SOLN
INTRAMUSCULAR | Status: DC | PRN
  Administered 2023-07-12: 10 mL

## 2023-07-12 MED ORDER — PHENYLEPHRINE 80 MCG/ML (10ML) SYRINGE FOR IV PUSH (FOR BLOOD PRESSURE SUPPORT)
PREFILLED_SYRINGE | INTRAVENOUS | Status: AC
  Filled 2023-07-12: qty 10

## 2023-07-12 MED ORDER — CHLORHEXIDINE GLUCONATE 0.12 % MT SOLN
15.0000 mL | Freq: Once | OROMUCOSAL | Status: AC
  Administered 2023-07-12: 15 mL via OROMUCOSAL
  Filled 2023-07-12: qty 15

## 2023-07-12 MED ORDER — PHENYLEPHRINE HCL-NACL 20-0.9 MG/250ML-% IV SOLN
INTRAVENOUS | Status: AC
  Filled 2023-07-12: qty 250

## 2023-07-12 MED ORDER — HEPARIN SOD (PORK) LOCK FLUSH 100 UNIT/ML IV SOLN
INTRAVENOUS | Status: AC
  Filled 2023-07-12: qty 5

## 2023-07-12 MED ORDER — FENTANYL CITRATE (PF) 250 MCG/5ML IJ SOLN
INTRAMUSCULAR | Status: AC
  Filled 2023-07-12: qty 5

## 2023-07-12 MED ORDER — PHENYLEPHRINE HCL-NACL 20-0.9 MG/250ML-% IV SOLN
INTRAVENOUS | Status: DC | PRN
  Administered 2023-07-12: 30 ug/min via INTRAVENOUS

## 2023-07-12 MED ORDER — ONDANSETRON HCL 4 MG/2ML IJ SOLN
INTRAMUSCULAR | Status: DC | PRN
  Administered 2023-07-12: 4 mg via INTRAVENOUS

## 2023-07-12 MED ORDER — HEPARIN 6000 UNIT IRRIGATION SOLUTION
Status: AC
  Filled 2023-07-12: qty 500

## 2023-07-12 MED ORDER — SODIUM CHLORIDE 0.9 % IV SOLN
INTRAVENOUS | Status: DC | PRN
  Administered 2023-07-12: 10 mL

## 2023-07-12 MED ORDER — PROPOFOL 10 MG/ML IV BOLUS
INTRAVENOUS | Status: AC
  Filled 2023-07-12: qty 20

## 2023-07-12 MED ORDER — LIDOCAINE 2% (20 MG/ML) 5 ML SYRINGE
INTRAMUSCULAR | Status: DC | PRN
  Administered 2023-07-12: 60 mg via INTRAVENOUS

## 2023-07-12 MED ORDER — ORAL CARE MOUTH RINSE: 15.0000 mL | Freq: Once | OROMUCOSAL | Status: AC

## 2023-07-12 SURGICAL SUPPLY — 39 items
ADH SKN CLS APL DERMABOND .7 (GAUZE/BANDAGES/DRESSINGS) ×1
APL PRP STRL LF DISP 70% ISPRP (MISCELLANEOUS) ×1
BAG COUNTER SPONGE SURGICOUNT (BAG) ×1 IMPLANT
BAG DECANTER FOR FLEXI CONT (MISCELLANEOUS) ×1 IMPLANT
BAG SPNG CNTER NS LX DISP (BAG) ×1
CHLORAPREP W/TINT 26 (MISCELLANEOUS) ×1 IMPLANT
COVER SURGICAL LIGHT HANDLE (MISCELLANEOUS) ×1 IMPLANT
COVER TRANSDUCER ULTRASND GEL (DISPOSABLE) IMPLANT
DERMABOND ADVANCED .7 DNX12 (GAUZE/BANDAGES/DRESSINGS) ×1 IMPLANT
DRAPE C-ARM 42X120 X-RAY (DRAPES) ×1 IMPLANT
DRAPE LAPAROSCOPIC ABDOMINAL (DRAPES) ×1 IMPLANT
ELECT CAUTERY BLADE 6.4 (BLADE) ×1 IMPLANT
ELECT REM PT RETURN 9FT ADLT (ELECTROSURGICAL) ×1
ELECTRODE REM PT RTRN 9FT ADLT (ELECTROSURGICAL) ×1 IMPLANT
GEL ULTRASOUND 20GR AQUASONIC (MISCELLANEOUS) IMPLANT
GLOVE BIOGEL PI IND STRL 6 (GLOVE) ×1 IMPLANT
GLOVE SURG SYN 5.5 (GLOVE) ×1 IMPLANT
GLOVE SURG SYN 5.5 PF PI (GLOVE) ×1 IMPLANT
GOWN STRL REUS W/ TWL LRG LVL3 (GOWN DISPOSABLE) ×2 IMPLANT
GOWN STRL REUS W/TWL LRG LVL3 (GOWN DISPOSABLE) ×2
INTRODUCER COOK 11FR (CATHETERS) IMPLANT
KIT BASIN OR (CUSTOM PROCEDURE TRAY) ×1 IMPLANT
KIT PORT INFUSION SMART 8FR (Port) IMPLANT
KIT TURNOVER KIT B (KITS) ×1 IMPLANT
NS IRRIG 1000ML POUR BTL (IV SOLUTION) ×1 IMPLANT
PAD ARMBOARD 7.5X6 YLW CONV (MISCELLANEOUS) ×1 IMPLANT
PENCIL BUTTON HOLSTER BLD 10FT (ELECTRODE) ×1 IMPLANT
POSITIONER HEAD DONUT 9IN (MISCELLANEOUS) ×1 IMPLANT
SET INTRODUCER 12FR PACEMAKER (INTRODUCER) IMPLANT
SET SHEATH INTRODUCER 10FR (MISCELLANEOUS) IMPLANT
SHEATH COOK PEEL AWAY SET 9F (SHEATH) IMPLANT
SUT MNCRL AB 4-0 PS2 18 (SUTURE) ×1 IMPLANT
SUT PROLENE 2 0 SH DA (SUTURE) ×2 IMPLANT
SUT VIC AB 3-0 SH 27 (SUTURE) ×1
SUT VIC AB 3-0 SH 27X BRD (SUTURE) ×1 IMPLANT
SYR 5ML LUER SLIP (SYRINGE) ×1 IMPLANT
TOWEL GREEN STERILE (TOWEL DISPOSABLE) ×1 IMPLANT
TOWEL GREEN STERILE FF (TOWEL DISPOSABLE) ×1 IMPLANT
TRAY LAPAROSCOPIC MC (CUSTOM PROCEDURE TRAY) ×1 IMPLANT

## 2023-07-12 NOTE — Transfer of Care (Signed)
Immediate Anesthesia Transfer of [name] Note  Patient: [name]  Procedure(s) Performed: REVISION PORT-A-CATH  Patient Location: PACU  Anesthesia Type:General  Level of Consciousness: awake, alert , and oriented  Airway & Oxygen Therapy: Patient Spontanous Breathing  Post-op Assessment: Report given to RN, Post -op Vital signs reviewed and stable, and Patient moving all extremities  Post vital signs: Reviewed and stable  Last Vitals:  Vitals Value Taken Time  BP 136/48 07/12/23 1415  Temp 36.4 C 07/12/23 1409  Pulse 69 07/12/23 1416  Resp 7 07/12/23 1416  SpO2 100 % 07/12/23 1416  Vitals shown include unfiled device data.  Last Pain:  Vitals:   07/12/23 1116  PainSc: 0-No pain         Complications: No notable events documented.

## 2023-07-12 NOTE — Anesthesia Procedure Notes (Signed)
Procedure Name: LMA Insertion Date/Time: 07/12/2023 1:15 PM  Performed by: Kayleen Memos, CRNAPre-anesthesia Checklist: Patient identified, Emergency Drugs available, Suction available and Patient being monitored Patient Re-evaluated:Patient Re-evaluated prior to induction Oxygen Delivery Method: Circle System Utilized Preoxygenation: Pre-oxygenation with 100% oxygen Induction Type: IV induction LMA: LMA inserted LMA Size: 4.0 Tube type: Oral Number of attempts: 1 Airway Equipment and Method: Bite block Placement Confirmation: breath sounds checked- equal and bilateral and positive ETCO2 Tube secured with: Tape Dental Injury: Teeth and Oropharynx as per pre-operative assessment

## 2023-07-12 NOTE — H&P (Signed)
[name] is an 82 y.o. female.   Chief Complaint: port malpositioned HPI: [name] is an 82 yo female with unresectable gallbladder adenocarcinoma. She is chemotherapy (cisplatin/gemabraxane) and uses a port that was placed on 8/5 via the left subclavian vein. She recently had restaging scans, which showed that the tip of the port was in the azygous vein. Revision was requested by oncology and the patient presents today for surgery. Her last dose of Xarelto was 48 hours ago.   Past Medical History:  Diagnosis Date   Anemia    after CABG in june 2021   Arthritis    Cancer 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 There is a small calcified mass in the L:A near the LA appendage. No valvular masses seen with associated mitral annular calcification. LA Volume/ BSA27.4 ml/m2 No AS. Right ventricular systolic pressure is elevated at .   Jaundice    Myocardial infarction [facility])    June 2021   Neuromuscular disorder (HCC)    neuropathy in bilateral feet   Peripheral vascular disease (HCC)    Pneumonia    not hosp.    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  PTCAs with drug coated balloon Lt ant tibial artery and Lt popliteal artery  11/26/2019    Past Surgical History:  Procedure Laterality Date   ABDOMINAL AORTAGRAM  11/25/2019   ABDOMINAL AORTOGRAM W/LOWER EXTREMITY    ABDOMINAL AORTOGRAM N/A 06/15/2019   Procedure: ABDOMINAL AORTOGRAM;  Surgeon: Runell Gess, MD;  [facility];   Service: Cardiovascular;  Laterality: N/A;   ABDOMINAL AORTOGRAM W/LOWER EXTREMITY N/A 11/25/2019   Procedure: ABDOMINAL AORTOGRAM W/LOWER EXTREMITY;  Surgeon: Iran Ouch, MD;  [facility];  Service: Cardiovascular;  Laterality: N/A;  Lt leg   ABDOMINAL AORTOGRAM W/LOWER EXTREMITY N/A 08/01/2020   Procedure: ABDOMINAL AORTOGRAM W/LOWER EXTREMITY;  Surgeon: Runell Gess, MD;  [facility];  Service: Cardiovascular;  Laterality: N/A;   ABDOMINAL AORTOGRAM W/LOWER EXTREMITY N/A 04/28/2021   Procedure: ABDOMINAL AORTOGRAM W/LOWER EXTREMITY;  Surgeon: Sherren Kerns, MD;  [facility];  Service: Cardiovascular;  Laterality: N/A;   ABDOMINAL AORTOGRAM W/LOWER EXTREMITY N/A 10/13/2021   Procedure: ABDOMINAL AORTOGRAM W/LOWER EXTREMITY;  Surgeon: Leonie Douglas, MD;  [facility];  Service: Cardiovascular;  Laterality: N/A;   ABDOMINAL AORTOGRAM W/LOWER EXTREMITY N/A 11/09/2022   Procedure: ABDOMINAL AORTOGRAM W/LOWER EXTREMITY;  Surgeon: Leonie Douglas, MD;  [facility];  Service: Cardiovascular;  Laterality: N/A;   ABDOMINAL AORTOGRAM W/LOWER EXTREMITY N/A 11/16/2022   Procedure: ABDOMINAL AORTOGRAM W/LOWER EXTREMITY;  Surgeon: Leonie Douglas, MD;  [facility];  Service: Cardiovascular;  Laterality: N/A;   AMPUTATION TOE Left 05/03/2021   Procedure: AMPUTATION OF THIRD LEFT  TOE;  Surgeon: Leonie Douglas, MD;  Location[facility] OR;  Service: Vascular;  Laterality: Left;  APPENDECTOMY     APPLICATION OF WOUND VAC Right 07/21/2019   Procedure: Application Of Prevena Wound Vac Right Groin;  Surgeon: Nada Libman, MD;  Location: [facility] OR;  Service: Vascular;  Laterality: Right;   BILIARY BRUSHING  04/07/2023   Procedure: BILIARY BRUSHING;  Surgeon: Vida Rigger, MD;  Location: [facility] ENDOSCOPY;  Service: Gastroenterology;;   BILIARY STENT PLACEMENT  04/07/2023   Procedure: BILIARY STENT PLACEMENT;   Surgeon: Vida Rigger, MD;  Location: [facility] ENDOSCOPY;  Service: Gastroenterology;;   CARPAL TUNNEL RELEASE Left    CARPAL TUNNEL RELEASE Right    CESAREAN SECTION     x 2   CHOLECYSTECTOMY N/A 10/12/2022   Procedure: LAPAROSCOPIC PARTIAL FENESTRATING CHOLECYSTECTOMY;  Surgeon: Fritzi Mandes, MD;  [facility];  Service: General;  Laterality: N/A;   COLONOSCOPY     CORONARY ARTERY BYPASS GRAFT N/A 03/24/2020   Procedure: CORONARY ARTERY BYPASS GRAFTING (CABG) using LIMA to LAD (m); RIMA to RAMUS; Endoscopic Right Greater Saphenous Vein: SVG to Diag1; SVG to PLB (right); and SVG to PL (left).;  Surgeon: Linden Dolin, MD;  [facility];  Service: Open Heart Surgery;  Laterality: N/A;  BILATERAL IMA   ENDARTERECTOMY FEMORAL Right 07/21/2019   Procedure: RIGHT ENDARTERECTOMY FEMORAL WITH PATCH ANGIOPLASTY;  Surgeon: Nada Libman, MD;  [facility];  Service: Vascular;  Laterality: Right;   ENDARTERECTOMY FEMORAL Right 08/16/2020   Procedure: RIGHT FEMORAL ENDARTERECTOMY;  Surgeon: Maeola Harman, MD;  [facility] OR;  Service: Vascular;  Laterality: Right;   ENDOVEIN HARVEST OF GREATER SAPHENOUS VEIN Right 03/24/2020   Procedure: ENDOVEIN HARVEST OF GREATER SAPHENOUS VEIN;  Surgeon: Linden Dolin, MD;  [facility];  Service: Open Heart Surgery;  Laterality: Right;   ERCP N/A 04/07/2023   Procedure: ENDOSCOPIC RETROGRADE CHOLANGIOPANCREATOGRAPHY (ERCP);  Surgeon: Vida Rigger, MD;  Location: [facility] ENDOSCOPY;  Service: Gastroenterology;  Laterality: N/A;   EYE SURGERY     cataract removal bilaterally   FEMORAL-POPLITEAL BYPASS GRAFT Right 08/16/2020   Procedure: BYPASS GRAFT FEMORAL-POPLITEAL ARTERY;  Surgeon: Maeola Harman, MD;  Location: [facility] OR;  Service: Vascular;  Laterality: Right;   FEMORAL-POPLITEAL BYPASS GRAFT Left 05/03/2021   Procedure: LEFT FEMORAL TO BELOW KNEE POPLITEAL ARTERY BYPASS GRAFTING WITH 6MMX80 PTFE GRAFT;  Surgeon: Leonie Douglas,  MD;  [facility];  Service: Vascular;  Laterality: Left;   LEFT HEART CATH AND CORONARY ANGIOGRAPHY N/A 03/22/2020   Procedure: LEFT HEART CATH AND CORONARY ANGIOGRAPHY;  Surgeon: Swaziland, Peter M, MD;  Location: [facility] INVASIVE CV LAB;  Service: Cardiovascular;  Laterality: N/A;   LOWER EXTREMITY ANGIOGRAPHY Bilateral 06/15/2019   Procedure: Lower Extremity Angiography;  Surgeon: Runell Gess, MD;  Location: [facility] INVASIVE CV LAB;  Service: Cardiovascular;  Laterality: Bilateral;   LOWER EXTREMITY ANGIOGRAPHY Right 08/03/2019   Procedure: LOWER EXTREMITY ANGIOGRAPHY;  Surgeon: Runell Gess, MD;  [facility];  Service: Cardiovascular;  Laterality: Right;   LOWER EXTREMITY ANGIOGRAPHY N/A 09/07/2021   Procedure: LOWER EXTREMITY ANGIOGRAPHY;  Surgeon: Cephus Shelling, MD;  [facility];  Service: Cardiovascular;  Laterality: N/A;   PATCH ANGIOPLASTY Right 07/21/2019   Procedure: Patch Angioplasty Right Femoral Artery;  Surgeon: Nada Libman, MD;  Location: [facility] OR;  Service: Vascular;  Laterality: Right;   PERIPHERAL INTRAVASCULAR LITHOTRIPSY  11/25/2019   Procedure: INTRAVASCULAR LITHOTRIPSY;  Surgeon: Iran Ouch, MD;  [facility];  Service: Cardiovascular;;  LT. SFA  PERIPHERAL VASCULAR ATHERECTOMY  08/03/2019   Procedure: PERIPHERAL VASCULAR ATHERECTOMY;  Surgeon: Runell Gess, MD;  Location: [facility] INVASIVE CV LAB;  Service: Cardiovascular;;  right SFA, right TP trunk   PERIPHERAL VASCULAR ATHERECTOMY  11/25/2019   Procedure: PERIPHERAL VASCULAR ATHERECTOMY;  Surgeon: Iran Ouch, MD;  [facility];  Service: Cardiovascular;;  Lt.  POPLITEAL and AT   PERIPHERAL VASCULAR BALLOON ANGIOPLASTY Left 06/15/2019   Procedure: PERIPHERAL VASCULAR BALLOON ANGIOPLASTY;  Surgeon: Runell Gess, MD;  [facility];  Service: Cardiovascular;  Laterality: Left;  SFA UNSUCCESSFUL UNABLE TO CROSS  LESION   PERIPHERAL VASCULAR BALLOON ANGIOPLASTY  08/03/2019   Procedure: PERIPHERAL VASCULAR BALLOON ANGIOPLASTY;  Surgeon: Runell Gess, MD;  [facility];  Service: Cardiovascular;;  right SFA, Right TP trunk   PERIPHERAL VASCULAR BALLOON ANGIOPLASTY  09/07/2021   Procedure: PERIPHERAL VASCULAR BALLOON ANGIOPLASTY;  Surgeon: Cephus Shelling, MD;  [facility];  Service: Cardiovascular;;   PERIPHERAL VASCULAR BALLOON ANGIOPLASTY Right 10/13/2021   Procedure: PERIPHERAL VASCULAR BALLOON ANGIOPLASTY;  Surgeon: Leonie Douglas, MD;  [facility];  Service: Cardiovascular;  Laterality: Right;   PERIPHERAL VASCULAR BALLOON ANGIOPLASTY  11/09/2022   Procedure: PERIPHERAL VASCULAR BALLOON ANGIOPLASTY;  Surgeon: Leonie Douglas, MD;  [facility];  Service: Cardiovascular;;  fem-pop bypass and AT   PORTACATH PLACEMENT Left 04/29/2023   Procedure: INSERTION PORT-A-CATH;  Surgeon: Fritzi Mandes, MD;  Location: [facility] ORS;  Service: General;  Laterality: Left;   REMOVAL OF STONES  04/07/2023   Procedure: REMOVAL OF STONES;  Surgeon: Vida Rigger, MD;  Location: [facility] ENDOSCOPY;  Service: Gastroenterology;;   Dennison Mascot  04/07/2023   Procedure: Dennison Mascot;  Surgeon: Vida Rigger, MD;  Location: [facility] ENDOSCOPY;  Service: Gastroenterology;;   TEE WITHOUT CARDIOVERSION N/A 03/24/2020   Procedure: TRANSESOPHAGEAL ECHOCARDIOGRAM (TEE);  Surgeon: Linden Dolin, MD;  Location: [facility] OR;  Service: Open Heart Surgery;  Laterality: N/A;   TONSILLECTOMY     and adenoidectomy   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/2019   Procedure: TRANSMETATARSAL AMPUTATION;  Surgeon: Vivi Barrack, DPM;  [facility];  Service: Podiatry;  Laterality: Right;   TRANSMETATARSAL AMPUTATION Left 05/11/2021   Procedure: LEFT TRANSMETATARSAL AMPUTATION;  Surgeon: Maeola Harman, MD;  Location: [facility] OR;  Service: Vascular;  Laterality: Left;   TUBAL  LIGATION      Family History  Problem Relation Age of Onset   Cancer - Prostate Father    Cancer - Colon Father    Stroke Mother    Hypertension Mother    Hyperlipidemia Mother    Melanoma Brother    Social History:  reports that she quit smoking about 51 years ago. Her smoking use included cigarettes. She has never used smokeless tobacco. She reports that she does not drink alcohol and does not use drugs.  Allergies:  Allergies  Allergen Reactions   Versed [Midazolam] Anxiety    Frantic, out of my mind, agitated    Augmentin [Amoxicillin-Pot Clavulanate] Other (See Comments)    Elevates potassium level   Bactrim [Sulfamethoxazole-Trimethoprim] Other (See Comments)    ^ K+( elevated)    Demerol [Meperidine]     Delusional    Meperidine Hcl     Other reaction(s): delusional   Scopolamine     Delusional   Patch *   Tramadol Other (See Comments)    Keeps awake    Medications Prior to Admission  Medication Sig Dispense Refill   aspirin EC 81 MG tablet Take 81 mg by mouth at bedtime. Swallow whole.     atorvastatin (LIPITOR) 80 MG tablet Take 1 tablet (80 mg total) by mouth daily. 90 tablet 3   brimonidine (ALPHAGAN) 0.2 % ophthalmic solution Place 1 drop into the right eye 2 (two) times daily.     hydrochlorothiazide (HYDRODIURIL) 12.5 MG tablet Take 1 tablet (12.5 mg total) by mouth daily. 90 tablet 0   Insulin Lispro Prot & Lispro (HUMALOG MIX 75/25 KWIKPEN) (75-25) 100 UNIT/ML Kwikpen INJECT 8 UNITS SUBCUTANEOUSLY ONCE DAILY WITH BREAKFAST 15 mL 4   JARDIANCE 25 MG TABS tablet TAKE 1 TABLET BY MOUTH ONCE DAILY BEFORE BREAKFAST 90 tablet 3   lidocaine-prilocaine (EMLA) cream Apply to affected area once (Patient taking differently: Apply 1 Application topically as needed (port).) 30 g 3   magnesium oxide (MAG-OX) 400 (240 Mg) MG tablet Take 1 tablet (400 mg total) by mouth 2 (two) times daily. 60 tablet 1   metFORMIN (GLUCOPHAGE) 1000 MG tablet Take 1 tablet by mouth twice  daily 180 tablet 0   metoprolol tartrate (LOPRESSOR) 50 MG tablet Take 1 tablet (50 mg total) by mouth 2 (two) times daily. 180 tablet 0   Multiple Vitamins-Minerals (MULTI FOR HER PO) Take 1 tablet by mouth daily.     mupirocin ointment (BACTROBAN) 2 % Apply 1 Application topically 2 (two) times daily. (Patient taking differently: Apply 1 Application topically 2 (two) times daily as needed (wound care for toes).) 30 g 2   ondansetron (ZOFRAN) 8 MG tablet Take 1 tablet (8 mg total) by mouth every 8 (eight) hours as needed for nausea or vomiting. Start on the third day after cisplatin. 30 tablet 1   pantoprazole (PROTONIX) 40 MG tablet Take 1 tablet by mouth once daily 90 tablet 3   prochlorperazine (COMPAZINE) 10 MG tablet Take 1 tablet (10 mg total) by mouth every 6 (six) hours as needed (Nausea or vomiting). 30 tablet 1   Propylene Glycol (SYSTANE BALANCE) 0.6 % SOLN Place 1 drop into the left eye 3 (three) times daily as needed (dry/irritated eyes).     repaglinide (PRANDIN) 2 MG tablet Take 1 tab by breakfast and 1 tab before dinner 180 tablet 2   rivaroxaban (XARELTO) 2.5 MG TABS tablet Take 1 tablet by mouth twice daily 60 tablet 11   telmisartan (MICARDIS) 80 MG tablet Take 1 tablet by mouth once daily 90 tablet 3   Insulin Pen Needle (ULTRA-THIN II MINI PEN NEEDLE) 31G X 5 MM MISC USE AS DIRECTED 50 each 2   Lancets (ONETOUCH DELICA PLUS LANCET33G) MISC Use to check blood sugars twice daily. (Patient taking differently: Use to check blood sugars  daily.) 100 each 5   ONETOUCH VERIO test strip USE TO CHECK BLOOD SUGARS ONCE DAILY 100 each 2    Results for orders placed or performed during the hospital encounter of 07/12/23 (from the past 48 hour(s))  Glucose, capillary     Status: Abnormal   Collection Time: 07/12/23 11:04 AM  Result Value Ref Range   Glucose-Capillary 127 (H) 70 - 99 mg/dL    Comment: Glucose reference range applies only to samples taken after fasting for at least 8  hours.   No results found.  Review of Systems  Blood pressure (!) 128/48, pulse 80, temperature 97.8 F (36.6 C), height 5' 5.5" (1.664 m), weight 60.3 kg, SpO2 100%. Physical Exam Vitals reviewed.  Constitutional:  Appearance: Normal appearance.  HENT:     Head: Normocephalic and atraumatic.  Eyes:     General: No scleral icterus.    Conjunctiva/sclera: Conjunctivae normal.  Pulmonary:     Effort: Pulmonary effort is normal. No respiratory distress.     Comments: Port in place left upper chest wall. Skin:    General: Skin is warm and dry.     Coloration: Skin is not jaundiced.  Neurological:     General: No focal deficit present.     Mental Status: She is alert and oriented to person, place, and time.      Assessment/Plan 82 yo female with malpositioned port. Proceed to the OR for portacath revision. The procedure details were reviewed and informed consent obtained. Plan for CXR in PACU and discharge home. Patient may resume Xarelto tomorrow morning.  Fritzi Mandes, MD 07/12/2023, 12:21 PM

## 2023-07-12 NOTE — Anesthesia Preprocedure Evaluation (Signed)
Anesthesia Evaluation  Patient identified by MRN, date of birth, ID band Patient awake    Reviewed: Allergy & Precautions, NPO status , Patient's Chart, lab work & pertinent test results, reviewed documented beta blocker date and time   History of Anesthesia Complications (+) history of anesthetic complications  Airway Mallampati: II  TM Distance: >3 FB Neck ROM: Limited    Dental  (+) Caps, Dental Advisory Given   Pulmonary pneumonia, resolved, former smoker   Pulmonary exam normal breath sounds clear to auscultation       Cardiovascular Exercise Tolerance: Poor hypertension, + CAD, + Past MI, + CABG and + Peripheral Vascular Disease  Normal cardiovascular exam+ dysrhythmias + Valvular Problems/Murmurs  Rhythm:Regular Rate:Normal  Malposition of portacath  CABG 6/21  S/P angioplasty with stent Lt SFA of prox segment.  PTCAs with drug coated balloon Lt ant tibial artery and Lt popliteal artery   Echo 03/23/20  1. Left ventricular ejection fraction, by estimation, is 60 to 65%. The  left ventricle has normal function. The left ventricle has no regional  wall motion abnormalities. Left ventricular diastolic parameters are  consistent with Grade I diastolic  dysfunction (impaired relaxation).   2. Right ventricular systolic function is normal. The right ventricular  size is normal. There is normal pulmonary artery systolic pressure.   3. Left atrial size was mildly dilated.   4. The mitral valve is normal in structure. Trivial mitral valve  regurgitation. No evidence of mitral stenosis.   5. The aortic valve is normal in structure. Aortic valve regurgitation is  not visualized. No aortic stenosis is present.   6. The inferior vena cava is normal in size with greater than 50%  respiratory variability, suggesting right atrial pressure of 3 mmHg.   EKG 12.21/23 NSR, LAFB, LAD  Cardiac Cath 03/02/20  Ost LM to Mid LM lesion is  45% stenosed.  Prox LAD to Mid LAD lesion is 90% stenosed.  Mid LAD lesion is 75% stenosed.  Dist LAD lesion is 70% stenosed.  Ost Cx to Prox Cx lesion is 95% stenosed.  Prox Cx to Mid Cx lesion is 99% stenosed.  2nd Mrg lesion is 85% stenosed.  1st Mrg lesion is 95% stenosed.  Ost RCA lesion is 50% stenosed.  Mid RCA to Dist RCA lesion is 90% stenosed.  RPDA lesion is 70% stenosed.  The left ventricular systolic function is normal.  LV end diastolic pressure is mildly elevated.  The left ventricular ejection fraction is 55-65% by visual estimate.   1. Severe 3 vessel obstructive CAD. 2. Normal LV function 3. Elevated LVEDP 23 mm Hg     Neuro/Psych Diabetic peripheral neuropathy  Neuromuscular disease  negative psych ROS   GI/Hepatic hiatal hernia,GERD  Medicated,,Cholangiocarcinoma of liver and GB   Endo/Other  diabetes, Poorly Controlled, Type 2, Insulin Dependent, Oral Hypoglycemic Agents  Hyperlipidemia   Renal/GU Renal InsufficiencyRenal disease  negative genitourinary   Musculoskeletal  (+) Arthritis , Osteoarthritis,    Abdominal   Peds  Hematology  (+) Blood dyscrasia, anemia Xarelto therapy- last dose 10/16   Anesthesia Other Findings   Reproductive/Obstetrics                              Anesthesia Physical Anesthesia Plan  ASA: 3  Anesthesia Plan: General   Post-op Pain Management: Minimal or no pain anticipated   Induction: Intravenous  PONV Risk Score and Plan: 4 or greater and Treatment  may vary due to age or medical condition and Ondansetron  Airway Management Planned: LMA  Additional Equipment: None  Intra-op Plan:   Post-operative Plan: Extubation in OR  Informed Consent: I have reviewed the patients History and Physical, chart, labs and discussed the procedure including the risks, benefits and alternatives for the proposed anesthesia with the patient or authorized representative who has  indicated his/her understanding and acceptance.     Dental advisory given  Plan Discussed with: CRNA and Anesthesiologist  Anesthesia Plan Comments:          Anesthesia Quick Evaluation

## 2023-07-12 NOTE — Op Note (Signed)
Date: 07/12/23  Patient: [name] MRN: [id number]  Preoperative Diagnosis: Port malpositioned, gallbladder cancer Postoperative Diagnosis: Same  Procedure: Port-a-cath replacement  Surgeon: Sophronia Simas, MD  EBL: Minimal  Anesthesia: General LMA  Specimens: None  Indications: [name] is an 82 yo female with gallbladder cancer, for which she is on systemic chemotherapy. The tip of her portacath was recently noted to be malpositioned in the azygous vein on surveillance imaging. Revision of the catheter was requested.  Findings: Previous port was removed, and a new single-lumen power port was placed via the left subclavian vein, using the same access. Total catheter length of 25cm.  Procedure details: Informed consent was obtained in the preoperative area prior to the procedure. The patient was brought to the operating room and placed on the table in the supine position. General anesthesia was induced and appropriate lines and drains were placed for intraoperative monitoring. Perioperative antibiotics were administered per SCIP guidelines. The neck and chest were prepped and draped in the usual sterile fashion. A pre-procedure timeout was taken verifying patient identity, surgical site and procedure to be performed.  The patient was placed in Trendelenberg position. A skin incision was made over the port through the old scar, and the subcutaneous tissue was divided with cautery. The port and catheter were identified, and the catheter was disconnected from the port. A wire was advanced through the catheter, and a fluoroscopic image confirmed position of the wire in the SVC. The old catheter was removed, with the tip in tact. A new port kit was brought onto the field and the port and tubing flushed. An introducer and sheath were advanced over the wire under fluoroscopic guidance, and the wire and sheath were removed. The new tubing was advanced through the sheath, and fluoro was used to  confirm position of the tip in the distal SVC. 10mL of diluted contrast was injected to confirm appropriate positioning of the catheter tip. The catheter was then cut at a length of 25cm, slightly longer than the previous catheter to prevent retraction into the azygous vein. The new catheter connector was not compatible with the old port, so the old port was removed from the subcutaneous pocket. The new port was secured in the subcutaneous pocket using 2-0 prolene sutures. The catheter was connected to the port and secured with the connector. The prolene sutures were then tied down. The port was accessed with a Huber needle and blood was aspirated. The port was flushed with heparinized saline. A final fluoroscopic image confirmed appropriate position of the catheter tip within the SVC. A final flush of 500 units heparin (100 U/mL) was given via the port. The deep dermis was closed with interrupted 3-0 Vicryl sutures and the subcuticular layer was closed with a running 4-0 monocryl. Dermabond was applied.  The patient tolerated the procedure well with no apparent complications. All counts were correct x2 at the end of the procedure. The patient was extubated and taken to PACU in stable condition.  Sophronia Simas, MD 07/12/23 2:00 PM

## 2023-07-12 NOTE — Anesthesia Postprocedure Evaluation (Signed)
Anesthesia Post Note  Patient: [name]  Procedure(s) Performed: REVISION PORT-A-CATH     Patient location during evaluation: PACU Anesthesia Type: General Level of consciousness: awake and alert and oriented Pain management: pain level controlled Vital Signs Assessment: post-procedure vital signs reviewed and stable Respiratory status: spontaneous breathing, nonlabored ventilation and respiratory function stable Cardiovascular status: blood pressure returned to baseline and stable Postop Assessment: no apparent nausea or vomiting Anesthetic complications: no Comments: CXR reviewed. Tip in good position. No Ptx.    No notable events documented.  Last Vitals:  Vitals:   07/12/23 1500 07/12/23 1515  BP: (!) 143/54 (!) 170/64  Pulse: 69 71  Resp: 10 17  Temp:    SpO2: 98% 100%    Last Pain:  Vitals:   07/12/23 1116  PainSc: 0-No pain                 Kveon Casanas A.

## 2023-07-12 NOTE — Discharge Instructions (Signed)
SURGERY DISCHARGE INSTRUCTIONS: PORT-A-CATH PLACEMENT  Activity You may resume your usual activities as tolerated Ok to shower in 24 hours, but do not bathe or submerge incisions underwater. Do not drive while taking narcotic pain medication.  Wound Care Your incision is covered with skin glue called Dermabond. This will peel off on its own over time. You may shower and allow warm soapy water to run over your incisions. Gently pat dry. Do not submerge your incision underwater. Monitor your incision for any new redness, tenderness, or drainage. You may start using your port in 48 hours. Do not apply EMLA cream directly over the Dermabond (skin glue).  When to Call us: Fever greater than 100.5 New redness, drainage, or swelling at incision site Severe pain, nausea, or vomiting Shortness of breath, difficulty breathing  For questions or concerns, please call the [facility] Surgery office at [phone].  You may resume taking your Xarelto the morning following surgery (Saturday morning October 19).

## 2023-07-14 IMAGING — DX DG FOOT COMPLETE 3+V*R*
3 series · 3 of 3 positions shown · non-contrast
Comparison: Right foot x-ray 09/04/2019.

CLINICAL DATA: Nonhealing wound distal end of first metatarsal.

EXAM:
RIGHT FOOT COMPLETE - 3+ VIEW

[foot ap]
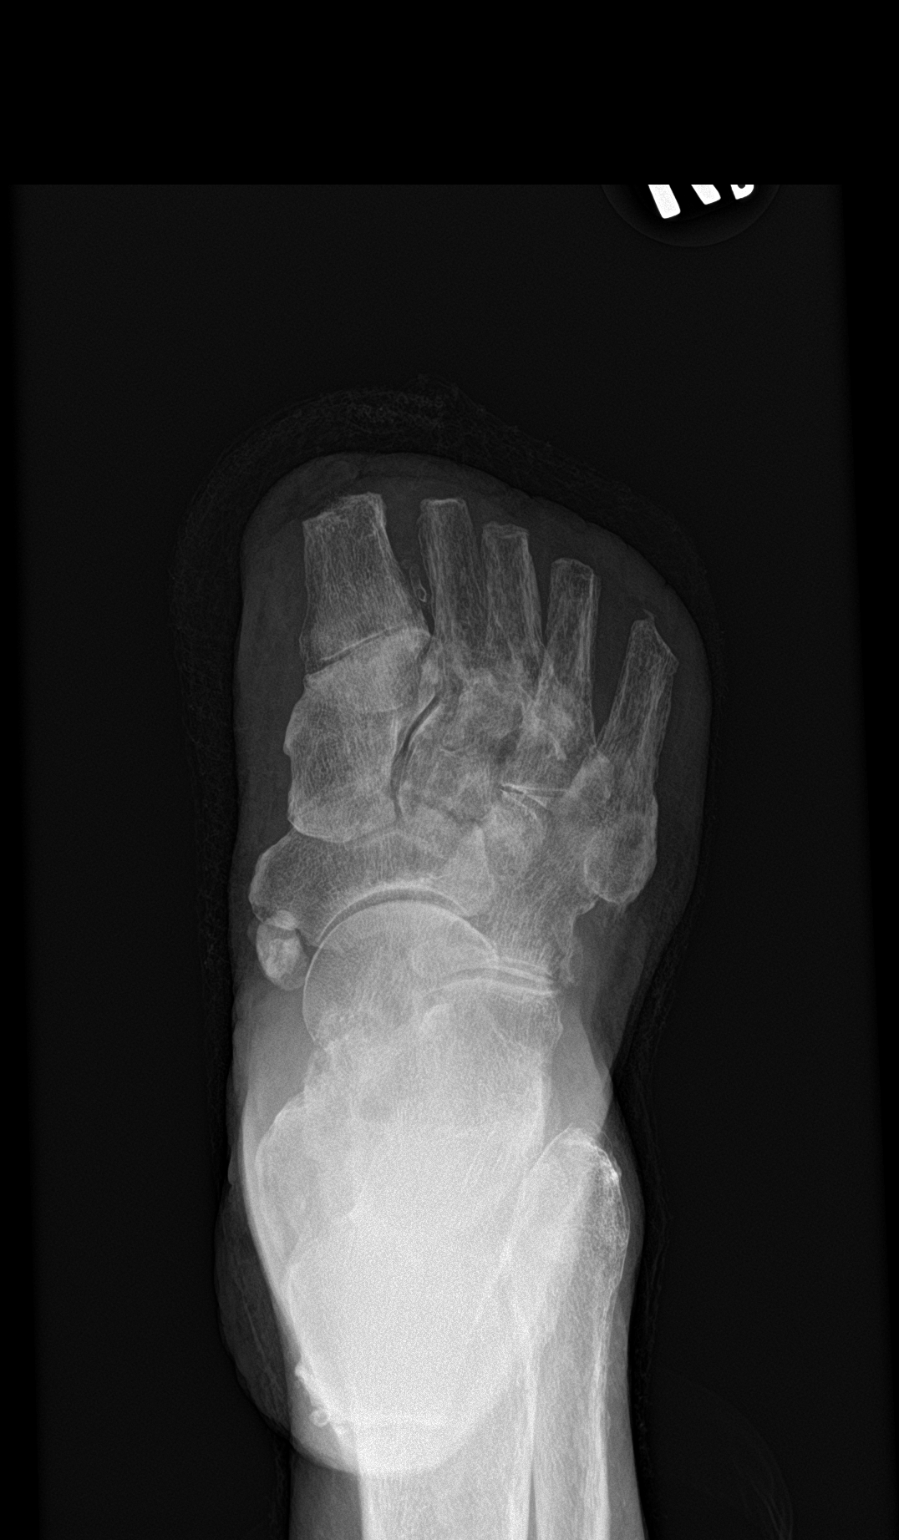

[foot obl]
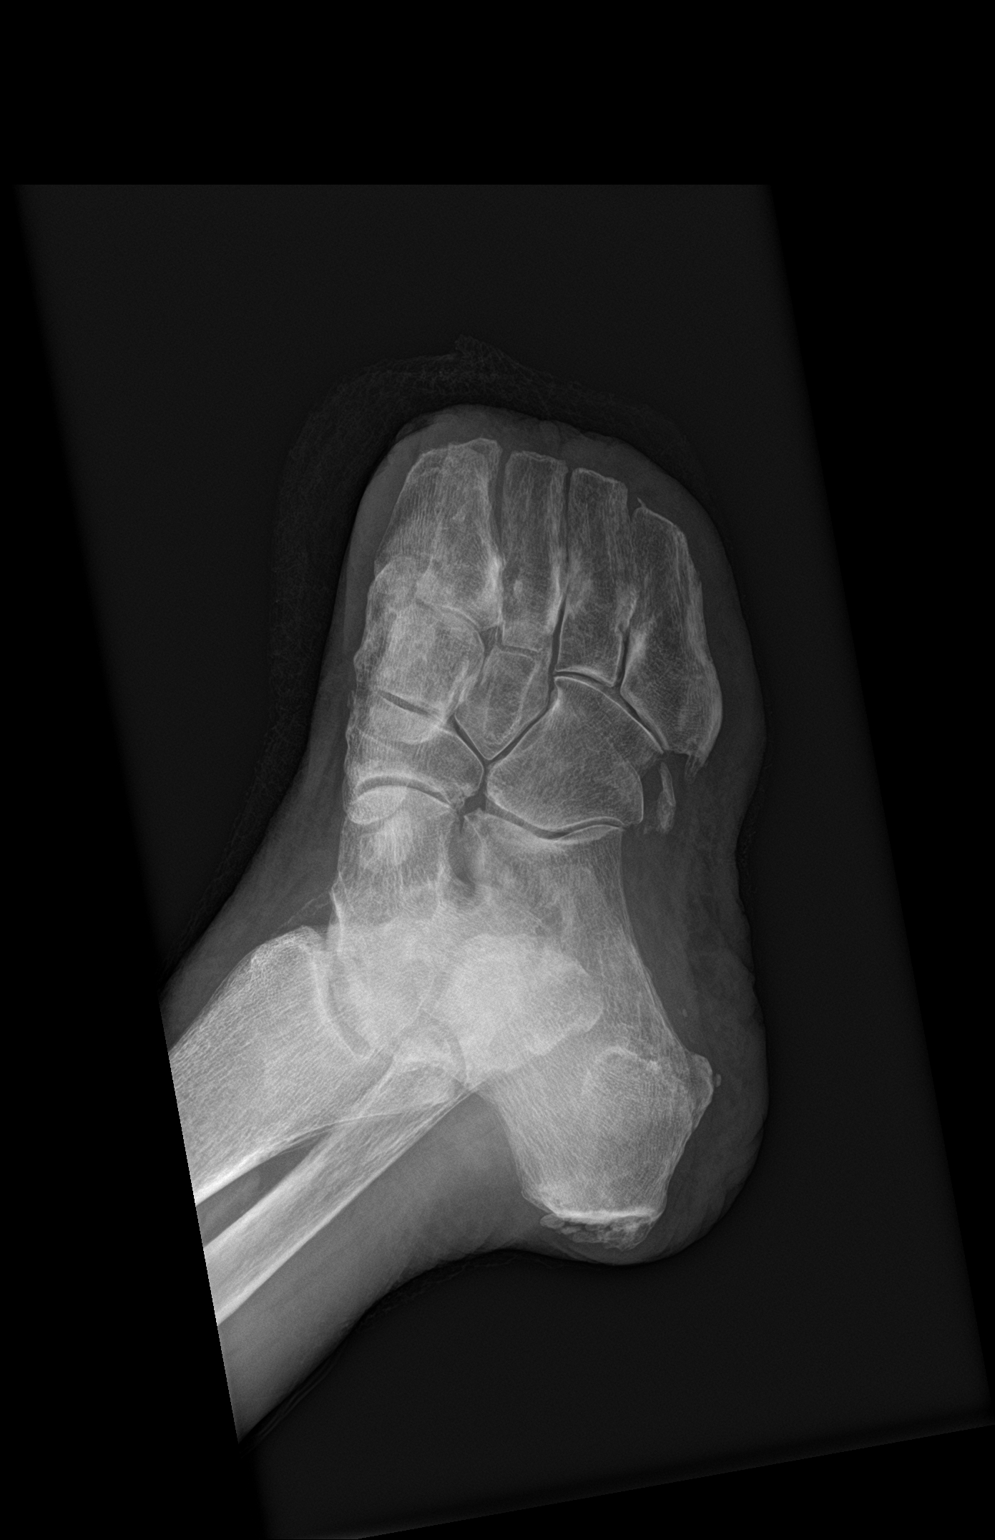

[foot lat]
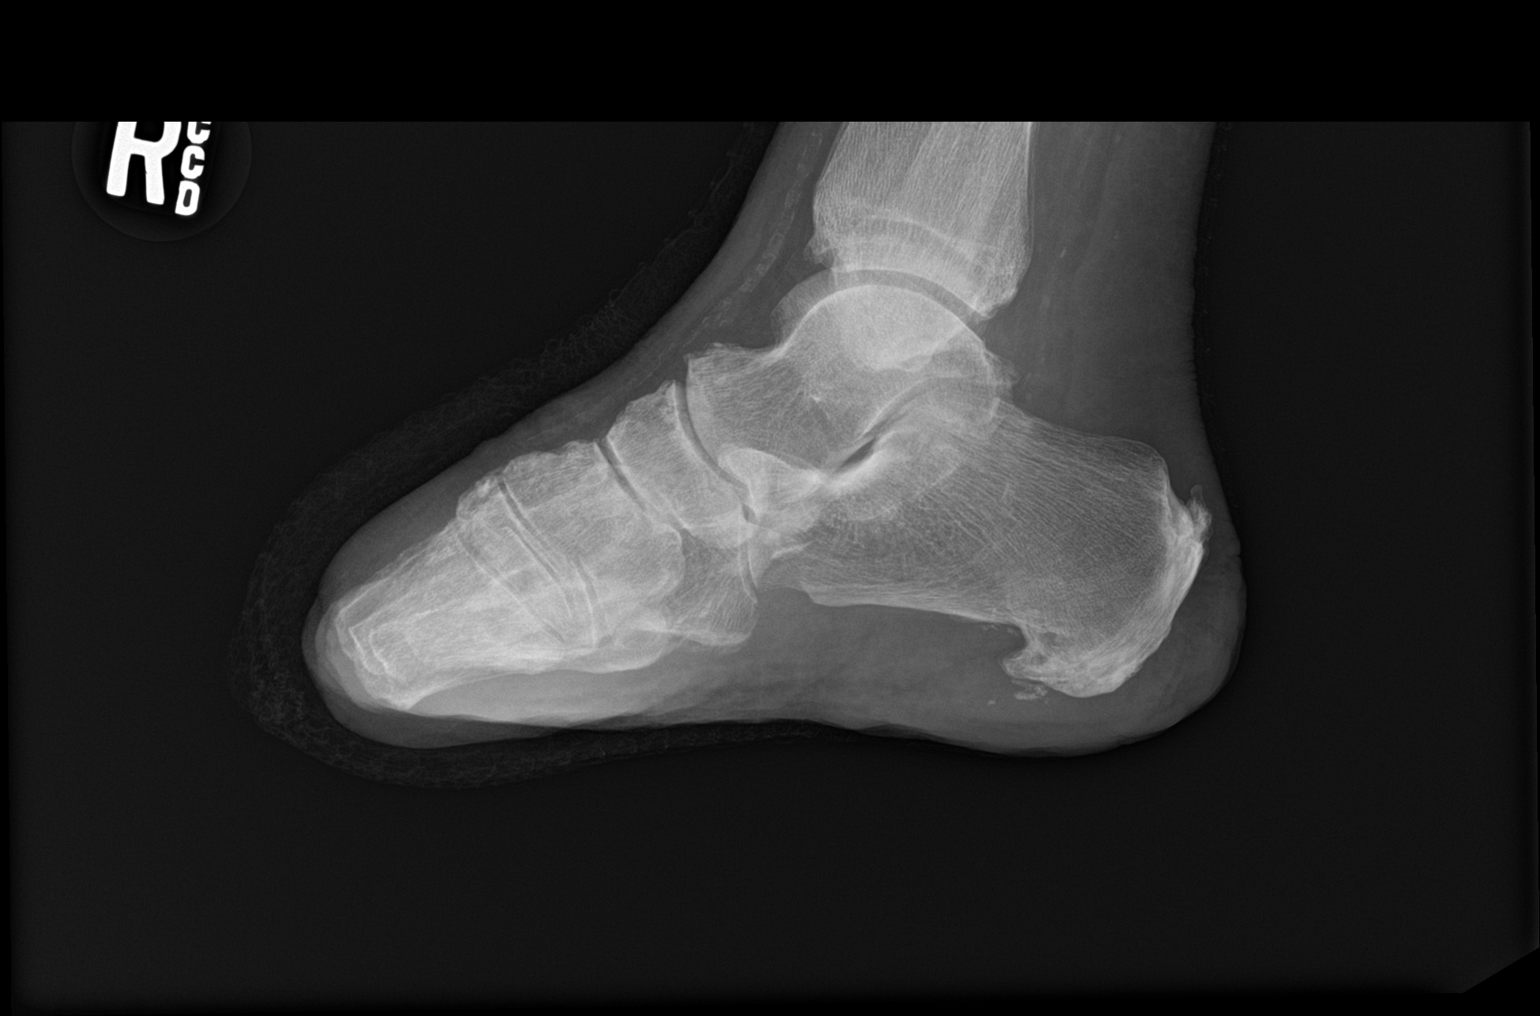

[3 of 3 positions shown; findings below may reference images not displayed]

FINDINGS: There has been prior amputations of the mid first through fifth
distal metatarsals. There is some soft tissue swelling overlying the
first metatarsal. There is minimal periosteal reaction and lucency
in the central portion of the remaining portion of the distal first
metatarsal. Osteomyelitis can not be excluded. No evidence for
fracture or dislocation. The bones are osteopenic. There are small
plantar and posterior calcaneal spurs. Peripheral vascular
calcifications are present.
IMPRESSION: 1. Mid 9st-9th metatarsal amputations.
2. Soft tissue swelling overlying the remaining first metatarsal
with minimal periosteal reaction suspicious for osteomyelitis.

## 2023-07-15 ENCOUNTER — Encounter (HOSPITAL_COMMUNITY): Payer: Self-pay | Admitting: Surgery

## 2023-07-15 NOTE — Plan of Care (Signed)
[facility] Tonsillectomy/Adenoidectomy, Postoperative PEDS care plan entered in error.

## 2023-07-16 MED FILL — Fosaprepitant Dimeglumine For IV Infusion 150 MG (Base Eq): INTRAVENOUS | Qty: 5 | Status: AC

## 2023-07-17 ENCOUNTER — Other Ambulatory Visit: Payer: Self-pay

## 2023-07-17 ENCOUNTER — Inpatient Hospital Stay: Payer: Medicare Other

## 2023-07-17 VITALS — BP 153/51 | HR 98 | Temp 98.5°F

## 2023-07-17 DIAGNOSIS — Z5112 Encounter for antineoplastic immunotherapy: Secondary | ICD-10-CM | POA: Diagnosis not present

## 2023-07-17 DIAGNOSIS — I10 Essential (primary) hypertension: Secondary | ICD-10-CM | POA: Diagnosis not present

## 2023-07-17 DIAGNOSIS — E119 Type 2 diabetes mellitus without complications: Secondary | ICD-10-CM | POA: Diagnosis not present

## 2023-07-17 DIAGNOSIS — C23 Malignant neoplasm of gallbladder: Secondary | ICD-10-CM | POA: Diagnosis not present

## 2023-07-17 DIAGNOSIS — D649 Anemia, unspecified: Secondary | ICD-10-CM | POA: Diagnosis not present

## 2023-07-17 DIAGNOSIS — Z5111 Encounter for antineoplastic chemotherapy: Secondary | ICD-10-CM | POA: Diagnosis not present

## 2023-07-17 MED ORDER — SODIUM CHLORIDE 0.9 % IV SOLN: Freq: Once | INTRAVENOUS | Status: AC

## 2023-07-17 MED ORDER — POTASSIUM CHLORIDE IN NACL 20-0.9 MEQ/L-% IV SOLN
Freq: Once | INTRAVENOUS | Status: AC
  Filled 2023-07-17: qty 1000

## 2023-07-17 MED ORDER — HEPARIN SOD (PORK) LOCK FLUSH 100 UNIT/ML IV SOLN
500.0000 [IU] | Freq: Once | INTRAVENOUS | Status: AC | PRN
  Administered 2023-07-17: 500 [IU]

## 2023-07-17 MED ORDER — MAGNESIUM SULFATE 2 GM/50ML IV SOLN
2.0000 g | Freq: Once | INTRAVENOUS | Status: AC
  Administered 2023-07-17: 2 g via INTRAVENOUS
  Filled 2023-07-17: qty 50

## 2023-07-17 MED ORDER — SODIUM CHLORIDE 0.9 % IV SOLN
800.0000 mg/m2 | Freq: Once | INTRAVENOUS | Status: AC
  Administered 2023-07-17: 1369 mg via INTRAVENOUS
  Filled 2023-07-17: qty 36.01

## 2023-07-17 MED ORDER — SODIUM CHLORIDE 0.9 % IV SOLN
150.0000 mg | Freq: Once | INTRAVENOUS | Status: AC
  Administered 2023-07-17: 150 mg via INTRAVENOUS
  Filled 2023-07-17: qty 150
  Filled 2023-07-17: qty 5

## 2023-07-17 MED ORDER — SODIUM CHLORIDE 0.9% FLUSH
10.0000 mL | INTRAVENOUS | Status: DC | PRN
  Administered 2023-07-17: 10 mL

## 2023-07-17 MED ORDER — SODIUM CHLORIDE 0.9 % IV SOLN
20.0000 mg/m2 | Freq: Once | INTRAVENOUS | Status: AC
  Administered 2023-07-17: 34 mg via INTRAVENOUS
  Filled 2023-07-17: qty 34

## 2023-07-17 MED ORDER — PALONOSETRON HCL INJECTION 0.25 MG/5ML
0.2500 mg | Freq: Once | INTRAVENOUS | Status: AC
  Administered 2023-07-17: 0.25 mg via INTRAVENOUS
  Filled 2023-07-17: qty 5

## 2023-07-17 NOTE — Patient Instructions (Signed)
[facility]  Discharge Instructions: Thank you for choosing [facility] to provide your oncology and hematology care.   If you have a lab appointment with the [facility], please go directly to the [facility] and check in at the registration area.   Wear comfortable clothing and clothing appropriate for easy access to any Portacath or PICC line.   We strive to give you quality time with your provider. You may need to reschedule your appointment if you arrive late (15 or more minutes).  Arriving late affects you and other patients whose appointments are after yours.  Also, if you miss three or more appointments without notifying the office, you may be dismissed from the clinic at the provider's discretion.      For prescription refill requests, have your pharmacy contact our office and allow 72 hours for refills to be completed.    Today you received the following chemotherapy and/or immunotherapy agents :  Gemcitabine & Cisplatin      To help prevent nausea and vomiting after your treatment, we encourage you to take your nausea medication as directed.  BELOW ARE SYMPTOMS THAT SHOULD BE REPORTED IMMEDIATELY: *FEVER GREATER THAN 100.4 F (38 C) OR HIGHER *CHILLS OR SWEATING *NAUSEA AND VOMITING THAT IS NOT CONTROLLED WITH YOUR NAUSEA MEDICATION *UNUSUAL SHORTNESS OF BREATH *UNUSUAL BRUISING OR BLEEDING *URINARY PROBLEMS (pain or burning when urinating, or frequent urination) *BOWEL PROBLEMS (unusual diarrhea, constipation, pain near the anus) TENDERNESS IN MOUTH AND THROAT WITH OR WITHOUT PRESENCE OF ULCERS (sore throat, sores in mouth, or a toothache) UNUSUAL RASH, SWELLING OR PAIN  UNUSUAL VAGINAL DISCHARGE OR ITCHING   Items with * indicate a potential emergency and should be followed up as soon as possible or go to the Emergency Department if any problems should occur.  Please show the CHEMOTHERAPY ALERT CARD or IMMUNOTHERAPY  ALERT CARD at check-in to the Emergency Department and triage nurse.  Should you have questions after your visit or need to cancel or reschedule your appointment, please contact [facility]  Dept: [phone]  and follow the prompts.  Office hours are 8:00 a.m. to 4:30 p.m. Monday - Friday. Please note that voicemails left after 4:00 p.m. may not be returned until the following business day.  We are closed weekends and major holidays. You have access to a nurse at all times for urgent questions. Please call the main number to the clinic Dept: [phone] and follow the prompts.   For any non-urgent questions, you may also contact your provider using MyChart. We now offer e-Visits for anyone 87 and older to request care online for non-urgent symptoms. For details visit [email or web address].   Also download the MyChart app! Go to the app store, search "MyChart", open the app, select [facility], and log in with your MyChart username and password.

## 2023-07-19 DIAGNOSIS — K831 Obstruction of bile duct: Secondary | ICD-10-CM | POA: Diagnosis not present

## 2023-07-25 ENCOUNTER — Ambulatory Visit: Payer: Medicare Other | Admitting: Hematology

## 2023-07-25 ENCOUNTER — Other Ambulatory Visit: Payer: Medicare Other

## 2023-07-26 ENCOUNTER — Ambulatory Visit: Payer: Medicare Other

## 2023-07-29 ENCOUNTER — Other Ambulatory Visit: Payer: Self-pay

## 2023-07-29 DIAGNOSIS — E1165 Type 2 diabetes mellitus with hyperglycemia: Secondary | ICD-10-CM

## 2023-07-29 MED ORDER — EMPAGLIFLOZIN 25 MG PO TABS: 25.0000 mg | ORAL_TABLET | Freq: Every day | ORAL | 3 refills | Status: DC

## 2023-07-29 MED ORDER — ULTRA-THIN II MINI PEN NEEDLE 31G X 5 MM MISC: 2 refills | Status: DC

## 2023-07-29 NOTE — Telephone Encounter (Signed)
Jardiance [facility] Pen needle refill request complete. Patient made aware via MyChart

## 2023-07-30 ENCOUNTER — Ambulatory Visit (INDEPENDENT_AMBULATORY_CARE_PROVIDER_SITE_OTHER): Payer: Medicare Other | Admitting: Podiatry

## 2023-07-30 ENCOUNTER — Encounter: Payer: Self-pay | Admitting: Podiatry

## 2023-07-30 DIAGNOSIS — I739 Peripheral vascular disease, unspecified: Secondary | ICD-10-CM | POA: Diagnosis not present

## 2023-07-30 DIAGNOSIS — L84 Corns and callosities: Secondary | ICD-10-CM

## 2023-07-30 DIAGNOSIS — E1142 Type 2 diabetes mellitus with diabetic polyneuropathy: Secondary | ICD-10-CM

## 2023-08-01 ENCOUNTER — Inpatient Hospital Stay: Payer: Medicare Other | Attending: Hematology

## 2023-08-01 ENCOUNTER — Inpatient Hospital Stay: Payer: Medicare Other | Admitting: Hematology

## 2023-08-01 ENCOUNTER — Encounter: Payer: Self-pay | Admitting: Hematology

## 2023-08-01 VITALS — BP 174/62 | HR 70 | Temp 97.6°F | Resp 18 | Wt 133.7 lb

## 2023-08-01 DIAGNOSIS — Z452 Encounter for adjustment and management of vascular access device: Secondary | ICD-10-CM | POA: Diagnosis not present

## 2023-08-01 DIAGNOSIS — Z79620 Long term (current) use of immunosuppressive biologic: Secondary | ICD-10-CM | POA: Insufficient documentation

## 2023-08-01 DIAGNOSIS — D649 Anemia, unspecified: Secondary | ICD-10-CM | POA: Insufficient documentation

## 2023-08-01 DIAGNOSIS — Z5112 Encounter for antineoplastic immunotherapy: Secondary | ICD-10-CM | POA: Insufficient documentation

## 2023-08-01 DIAGNOSIS — C23 Malignant neoplasm of gallbladder: Secondary | ICD-10-CM | POA: Diagnosis not present

## 2023-08-01 DIAGNOSIS — Z5111 Encounter for antineoplastic chemotherapy: Secondary | ICD-10-CM | POA: Insufficient documentation

## 2023-08-01 DIAGNOSIS — I10 Essential (primary) hypertension: Secondary | ICD-10-CM | POA: Insufficient documentation

## 2023-08-01 LAB — CBC WITH DIFFERENTIAL (CANCER CENTER ONLY)
Abs Immature Granulocytes: 0.03 10*3/uL (ref 0.00–0.07)
Basophils Absolute: 0.1 10*3/uL (ref 0.0–0.1)
Basophils Relative: 1 %
Eosinophils Absolute: 0.2 10*3/uL (ref 0.0–0.5)
Eosinophils Relative: 2 %
HCT: 29.5 % — ABNORMAL LOW (ref 36.0–46.0)
Hemoglobin: 9.7 g/dL — ABNORMAL LOW (ref 12.0–15.0)
Immature Granulocytes: 0 %
Lymphocytes Relative: 12 %
Lymphs Abs: 1.2 10*3/uL (ref 0.7–4.0)
MCH: 32.4 pg (ref 26.0–34.0)
MCHC: 32.9 g/dL (ref 30.0–36.0)
MCV: 98.7 fL (ref 80.0–100.0)
Monocytes Absolute: 1.2 10*3/uL — ABNORMAL HIGH (ref 0.1–1.0)
Monocytes Relative: 11 %
Neutro Abs: 7.6 10*3/uL (ref 1.7–7.7)
Neutrophils Relative %: 74 %
Platelet Count: 247 10*3/uL (ref 150–400)
RBC: 2.99 MIL/uL — ABNORMAL LOW (ref 3.87–5.11)
RDW: 19.1 % — ABNORMAL HIGH (ref 11.5–15.5)
WBC Count: 10.2 10*3/uL (ref 4.0–10.5)
nRBC: 0 % (ref 0.0–0.2)

## 2023-08-01 LAB — CMP (CANCER CENTER ONLY)
ALT: 25 U/L (ref 0–44)
AST: 32 U/L (ref 15–41)
Albumin: 3.7 g/dL (ref 3.5–5.0)
Alkaline Phosphatase: 138 U/L — ABNORMAL HIGH (ref 38–126)
Anion gap: 7 (ref 5–15)
BUN: 21 mg/dL (ref 8–23)
CO2: 29 mmol/L (ref 22–32)
Calcium: 9.8 mg/dL (ref 8.9–10.3)
Chloride: 103 mmol/L (ref 98–111)
Creatinine: 0.95 mg/dL (ref 0.44–1.00)
GFR, Estimated: >60 mL/min (ref >60)
Glucose, Bld: 94 mg/dL (ref 70–99)
Potassium: 4.1 mmol/L (ref 3.5–5.1)
Sodium: 139 mmol/L (ref 135–145)
Total Bilirubin: 0.4 mg/dL (ref <1.2)
Total Protein: 6.9 g/dL (ref 6.5–8.1)

## 2023-08-01 LAB — MAGNESIUM: Magnesium: 1.7 mg/dL (ref 1.7–2.4)

## 2023-08-01 MED FILL — Fosaprepitant Dimeglumine For IV Infusion 150 MG (Base Eq): INTRAVENOUS | Qty: 5 | Status: AC

## 2023-08-01 NOTE — Assessment & Plan Note (Addendum)
[id number], stage III -Patient had 1 episode of abdominal pain in October 2023, and was found to have a large gallbladder stone. -She underwent laparoscopic attempted cholecystectomy but was not able to be resected due to adhesions to duodenum and colon. -She developed jaundice in June 2024, status post ERCP and stent placement in [place].  CT and MRI scan showed a liver mass adjacent to the gallbladder fossae, and a biopsy confirmed adenocarcinoma.  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  Unfortunately this was felt to be not resectable by Dr. Freida Busman. -I reviewed her CT chest from April 22, 2023, which showed no evidence of distant metastasis. -I recommended first-line cisplatin, gemcitabine  every 14 days and durvalumab every 28 days.  If she responded well, will stop chemo after 4 to 6 months of treatment, and changed to maintenance therapy. She started on 05/03/23 every 2 weeks and has been tolerating well.  We have also discussed option of consolidation radiation at some point. -restaging CT 07/08/2023 showed partial response in the primary site tumor with direct invasion to liver.  No new disease or question.  Will continue chemotherapy.

## 2023-08-01 NOTE — Progress Notes (Signed)
[name]   [phone] Fax:([phone]   Clinic Follow up Note   Patient Care Team: Shon Hale, MD as PCP - General (Family Medicine) Runell Gess, MD as PCP - Cardiology (Cardiology) Malachy Mood, MD as Consulting Physician (Oncology)  Date of Service:  08/01/2023  CHIEF COMPLAINT: f/u of gallbladder cancer  CURRENT THERAPY:  First-line chemotherapy cisplatin and gemcitabine every 2 weeks, durvalumab every 4 weeks  Oncology History   Gallbladder cancer (HCC) cT4N0M0, stage III -Patient had 1 episode of abdominal pain in October 2023, and was found to have a large gallbladder stone. -She underwent laparoscopic attempted cholecystectomy but was not able to be resected due to adhesions to duodenum and colon. -She developed jaundice in June 2024, status post ERCP and stent placement in [place].  CT and MRI scan showed a liver mass adjacent to the gallbladder fossae, and a biopsy confirmed adenocarcinoma.  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  Unfortunately this was felt to be not resectable by Dr. Freida Busman. -I reviewed her CT chest from April 22, 2023, which showed no evidence of distant metastasis. -I recommended first-line cisplatin, gemcitabine  every 14 days and durvalumab every 28 days.  If she responded well, will stop chemo after 4 to 6 months of treatment, and changed to maintenance therapy. She started on 05/03/23 every 2 weeks and has been tolerating well.  We have also discussed option of consolidation radiation at some point. -restaging CT 07/08/2023 showed partial response in the primary site tumor with direct invasion to liver.  No new disease or question.  Will continue chemotherapy.   Assessment and Plan    Gallbladder Cancer Patient is tolerating chemotherapy well with improved energy levels and no significant side effects.  Tumor marker CA19.9 has decreased from 9000 to 4000, suggesting a positive response to treatment. Patient is interested in surgical options. -Refer patient's case to liver surgeon at [facility] for surgical evaluation. -Continue current chemotherapy regimen. -Check CA19.9 tumor marker.  Hypertension Elevated blood pressure noted at visit, possibly due to physical activity prior to measurement. -Repeat blood pressure measurement in infusion room.  Biliary Stent Temporary stent in place with no current issues or signs of jaundice. Patient is considering a permanent stent. -Coordinate with Dr. May God regarding timing of potential stent replacement based on surgical evaluation and treatment plan from [facility].  Hypomagnesia anemia Patient is currently taking oral magnesium twice daily. Levels not yet returned. -Check magnesium levels. -If low, increase oral magnesium to three times daily. -Continue to administer magnesium with chemotherapy treatments.  Plan -Lab reviewed, adequate for treatment, will proceed chemotherapy today -Lab, follow-up and chemo in 2 weeks -I will reach out to liver surgeon Dr. Gwenlyn Perking at [facility] to review her case     SUMMARY OF ONCOLOGIC HISTORY: Oncology History Overview Note   Cancer Staging  Gallbladder cancer [name]) Staging form: Distal Bile Duct, AJCC 8th Edition - Clinical stage from 04/09/2023: Stage IIIB (cT4, cN0, cM0) - Signed by Malachy Mood, MD on 04/18/2023 Total positive nodes: 0 Histologic grade (G): G2 Histologic grading system: 3 grade system     Gallbladder cancer (HCC)  04/04/2023 Imaging    IMPRESSION: 1. Ill-defined hypodensity in the region of the gallbladder fossa appears similar to the prior study given lack of contrast. This would be better evaluated with dedicated MRI. 2. Nonobstructing right renal calculus. 3. Large amount  of stool throughout the colon. 4. Stable indeterminate lucent areas in the vertebral bodies of the lumbar spine. 5.  Severe atherosclerotic disease.     04/09/2023 Pathology Results     FINAL MICROSCOPIC DIAGNOSIS:   A. LIVER, ADJACENT TO GALLBLADDER FOSSA, BIOPSY:  - Moderately differentiated adenocarcinoma    04/09/2023 Cancer Staging   Staging form: Distal Bile Duct, AJCC 8th Edition - Clinical stage from 04/09/2023: Stage IIIB (cT4, cN0, cM0) - Signed by Malachy Mood, MD on 04/18/2023 Total positive nodes: 0 Histologic grade (G): G2 Histologic grading system: 3 grade system   04/18/2023 Initial Diagnosis   Cholangiocarcinoma of liver (HCC)   05/03/2023 -  Chemotherapy   Patient is on Treatment Plan : BILIARY TRACT Cisplatin + Gemcitabine D1,15 + Durvalumab (1500) D1 q28d / Durvalumab (1500) q28d        Discussed the use of AI scribe software for clinical note transcription with the patient, who gave verbal consent to proceed.  History of Present Illness   [name], an 82 year old female with a history of gallbladder cancer, presents for a follow-up visit. She reports feeling "almost normal" in the past two weeks, with less fatigue and more energy than before. She denies any issues following the placement of her port and has not experienced any problems with her recent chemotherapy treatment. She also denies any nausea or diarrhea.  The patient is interested in exploring surgical options and has requested that her records be sent to [name] for a liver surgeon to review. She also mentions a temporary stent that has been placed, which has not caused any problems or signs of jaundice. She is open to the possibility of a permanent stent being placed in the future, depending on the outcome of the surgical consultation.  [name] is also taking oral magnesium twice daily and is receiving magnesium with her chemotherapy treatments. She reports no issues with this regimen.         All other systems were reviewed with the patient and are negative.  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 There is a small calcified mass in the L:A near the LA appendage. No valvular masses seen with associated mitral annular calcification. LA Volume/ BSA27.4 ml/m2 No AS. Right ventricular systolic pressure is elevated at .   Jaundice    Myocardial infarction [facility])    June 2021   Neuromuscular disorder (HCC)    neuropathy in bilateral feet   Peripheral vascular disease (HCC)    Pneumonia    not hosp.    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  PTCAs with drug coated balloon Lt ant tibial artery and Lt popliteal artery  11/26/2019    SURGICAL HISTORY: Past Surgical History:  Procedure Laterality Date   ABDOMINAL AORTAGRAM  11/25/2019  ABDOMINAL AORTOGRAM W/LOWER EXTREMITY    ABDOMINAL AORTOGRAM N/A 06/15/2019   Procedure: ABDOMINAL AORTOGRAM;  Surgeon: Runell Gess, MD;  Location: [name] INVASIVE CV LAB;  Service: Cardiovascular;  Laterality: N/A;   ABDOMINAL AORTOGRAM W/LOWER EXTREMITY N/A 11/25/2019   Procedure: ABDOMINAL AORTOGRAM W/LOWER EXTREMITY;  Surgeon: Iran Ouch, MD;  [facility]: [facility] INVAS[facility] CV LAB;  Service: Cardiovascular;  Laterality: N/A;  Lt leg   ABDOMINAL AORTOGRAM W/LOWER EXTREMITY N/A 08/01/2020   Procedure: ABDOMINAL AORTOGRAM W/LOWER EXTREMITY;  Surgeon: Runell Gess, MD;  [facility]: [facility] INVASIVE CV LAB;  Service: Cardiovascular;  Laterality: N/A;   ABDOMINAL AORTOGRAM  W/LOWER EXTREMITY N/A 04/28/2021   Procedure: ABDOMINAL AORTOGRAM W/LOWER EXTREMITY;  Surgeon: Sherren Kerns, MD;  [facility]: [facility] INVASIVE CV LAB;  Service: Cardiovascular;  Laterality: N/A;   ABDOMINAL AORTOGRAM W/LOWER EXTREMITY N/A 10/13/2021   Procedure: ABDOMINAL AORTOGRAM W/LOWER EXTREMITY;  Surgeon: Leonie Douglas, MD;  [facility] CV LAB;  Service: Cardiovascular;  Laterality: N/A;   ABDOMINAL AORTOGRAM W/LOWER EXTREMITY N/A 11/09/2022   Procedure: ABDOMINAL AORTOGRAM W/LOWER EXTREMITY;  Surgeon: Leonie Douglas, MD;  [facility] CV LAB;  Service: Cardiovascular;  Laterality: N/A;   ABDOMINAL AORTOGRAM W/LOWER EXTREMITY N/A 11/16/2022   Procedure: ABDOMINAL AORTOGRAM W/LOWER EXTREMITY;  Surgeon: Leonie Douglas, MD;  [facility] CV LAB;  Service: Cardiovascular;  Laterality: N/A;   AMPUTATION TOE Left 05/03/2021   Procedure: AMPUTATION OF THIRD LEFT  TOE;  Surgeon: Leonie Douglas, MD;  Location: [name];  Service: Vascular;  Laterality: Left;   APPENDECTOMY     APPLICATION OF WOUND VAC Right 07/21/2019   Procedure: Application Of Prevena Wound Vac Right Groin;  Surgeon: Nada Libman, MD;  Location: [name] OR;  Service: Vascular;  Laterality: Right;   BILIARY BRUSHING  04/07/2023   Procedure: BILIARY BRUSHING;  Surgeon: Vida Rigger, MD;  Location: [facility] ENDOSCOPY;  Service: Gastroenterology;;   BILIARY STENT PLACEMENT  04/07/2023   Procedure: BILIARY STENT PLACEMENT;  Surgeon: Vida Rigger, MD;  Location: [facility] ENDOSCOPY;  Service: Gastroenterology;;   CARPAL TUNNEL RELEASE Left    CARPAL TUNNEL RELEASE Right    CESAREAN SECTION     x 2   CHOLECYSTECTOMY N/A 10/12/2022   Procedure: LAPAROSCOPIC PARTIAL FENESTRATING CHOLECYSTECTOMY;  Surgeon: Fritzi Mandes, MD;  [facility] OR;  Service: General;  Laterality: N/A;   COLONOSCOPY     CORONARY ARTERY BYPASS GRAFT N/A 03/24/2020   Procedure: CORONARY ARTERY BYPASS GRAFTING (CABG) using LIMA to LAD (m); RIMA to  RAMUS; Endoscopic Right Greater Saphenous Vein: SVG to Diag1; SVG to PLB (right); and SVG to PL (left).;  Surgeon: Linden Dolin, MD;  Location: [facility] OR;  Service: Open Heart Surgery;  Laterality: N/A;  BILATERAL IMA   ENDARTERECTOMY FEMORAL Right 07/21/2019   Procedure: RIGHT ENDARTERECTOMY FEMORAL WITH PATCH ANGIOPLASTY;  Surgeon: Nada Libman, MD;  [facility] OR;  Service: Vascular;  Laterality: Right;   ENDARTERECTOMY FEMORAL Right 08/16/2020   Procedure: RIGHT FEMORAL ENDARTERECTOMY;  Surgeon: Maeola Harman, MD;  Location: [name] OR;  Service: Vascular;  Laterality: Right;   ENDOVEIN HARVEST OF GREATER SAPHENOUS VEIN Right 03/24/2020   Procedure: ENDOVEIN HARVEST OF GREATER SAPHENOUS VEIN;  Surgeon: Linden Dolin, MD;  [facility];  Service: Open Heart Surgery;  Laterality: Right;   ERCP N/A 04/07/2023   Procedure: ENDOSCOPIC RETROGRADE CHOLANGIOPANCREATOGRAPHY (ERCP);  Surgeon: Vida Rigger, MD;  Location: [facility] ENDOSCOPY;  Service: Gastroenterology;  Laterality: N/A;   EYE  SURGERY     cataract removal bilaterally   FEMORAL-POPLITEAL BYPASS GRAFT Right 08/16/2020   Procedure: BYPASS GRAFT FEMORAL-POPLITEAL ARTERY;  Surgeon: Maeola Harman, MD;  Location: [name] OR;  Service: Vascular;  Laterality: Right;   FEMORAL-POPLITEAL BYPASS GRAFT Left 05/03/2021   Procedure: LEFT FEMORAL TO BELOW KNEE POPLITEAL ARTERY BYPASS GRAFTING WITH 6MMX80 PTFE GRAFT;  Surgeon: Leonie Douglas, MD;  [facility];  Service: Vascular;  Laterality: Left;   LEFT HEART CATH AND CORONARY ANGIOGRAPHY N/A 03/22/2020   Procedure: LEFT HEART CATH AND CORONARY ANGIOGRAPHY;  Surgeon: Swaziland, Peter M, MD;  Location: [facility] INVASIVE CV LAB;  Service: Cardiovascular;  Laterality: N/A;   LOWER EXTREMITY ANGIOGRAPHY Bilateral 06/15/2019   Procedure: Lower Extremity Angiography;  Surgeon: Runell Gess, MD;  Location: [facility] INVASIVE CV LAB;  Service: Cardiovascular;  Laterality: Bilateral;   LOWER  EXTREMITY ANGIOGRAPHY Right 08/03/2019   Procedure: LOWER EXTREMITY ANGIOGRAPHY;  Surgeon: Runell Gess, MD;  [facility];  Service: Cardiovascular;  Laterality: Right;   LOWER EXTREMITY ANGIOGRAPHY N/A 09/07/2021   Procedure: LOWER EXTREMITY ANGIOGRAPHY;  Surgeon: Cephus Shelling, MD;  [facility];  Service: Cardiovascular;  Laterality: N/A;   PATCH ANGIOPLASTY Right 07/21/2019   Procedure: Patch Angioplasty Right Femoral Artery;  Surgeon: Nada Libman, MD;  Location: [name] OR;  Service: Vascular;  Laterality: Right;   PERIPHERAL INTRAVASCULAR LITHOTRIPSY  11/25/2019   Procedure: INTRAVASCULAR LITHOTRIPSY;  Surgeon: Iran Ouch, MD;  [facility];  Service: Cardiovascular;;  LT. SFA   PERIPHERAL VASCULAR ATHERECTOMY  08/03/2019   Procedure: PERIPHERAL VASCULAR ATHERECTOMY;  Surgeon: Runell Gess, MD;  Location: [facility] INVASIVE CV LAB;  Service: Cardiovascular;;  right SFA, right TP trunk   PERIPHERAL VASCULAR ATHERECTOMY  11/25/2019   Procedure: PERIPHERAL VASCULAR ATHERECTOMY;  Surgeon: Iran Ouch, MD;  [facility];  Service: Cardiovascular;;  Lt.  POPLITEAL and AT   PERIPHERAL VASCULAR BALLOON ANGIOPLASTY Left 06/15/2019   Procedure: PERIPHERAL VASCULAR BALLOON ANGIOPLASTY;  Surgeon: Runell Gess, MD;  [facility];  Service: Cardiovascular;  Laterality: Left;  SFA UNSUCCESSFUL UNABLE TO CROSS LESION   PERIPHERAL VASCULAR BALLOON ANGIOPLASTY  08/03/2019   Procedure: PERIPHERAL VASCULAR BALLOON ANGIOPLASTY;  Surgeon: Runell Gess, MD;  [facility];  Service: Cardiovascular;;  right SFA, Right TP trunk   PERIPHERAL VASCULAR BALLOON ANGIOPLASTY  09/07/2021   Procedure: PERIPHERAL VASCULAR BALLOON ANGIOPLASTY;  Surgeon: Cephus Shelling, MD;  [facility];  Service: Cardiovascular;;   PERIPHERAL VASCULAR BALLOON ANGIOPLASTY Right 10/13/2021    Procedure: PERIPHERAL VASCULAR BALLOON ANGIOPLASTY;  Surgeon: Leonie Douglas, MD;  [facility];  Service: Cardiovascular;  Laterality: Right;   PERIPHERAL VASCULAR BALLOON ANGIOPLASTY  11/09/2022   Procedure: PERIPHERAL VASCULAR BALLOON ANGIOPLASTY;  Surgeon: Leonie Douglas, MD;  [facility];  Service: Cardiovascular;;  fem-pop bypass and AT   PORTACATH PLACEMENT Left 04/29/2023   Procedure: INSERTION PORT-A-CATH;  Surgeon: Fritzi Mandes, MD;  Location: [facility] ORS;  Service: General;  Laterality: Left;   PORTACATH PLACEMENT N/A 07/12/2023   Procedure: REVISION PORT-A-CATH;  Surgeon: Fritzi Mandes, MD;  Location: [name] OR;  Service: General;  Laterality: N/A;   REMOVAL OF STONES  04/07/2023   Procedure: REMOVAL OF STONES;  Surgeon: Vida Rigger, MD;  Location: [facility] ENDOSCOPY;  Service: Gastroenterology;;   Dennison Mascot  04/07/2023   Procedure: SPHINCTEROTOMY;  Surgeon: Vida Rigger, MD;  Location: [facility] ENDOSCOPY;  Service: Gastroenterology;;   TEE WITHOUT CARDIOVERSION N/A 03/24/2020   Procedure: TRANSESOPHAGEAL ECHOCARDIOGRAM (TEE);  Surgeon: Linden Dolin, MD;  Location: [facility] OR;  Service: Open Heart Surgery;  Laterality: N/A;   TONSILLECTOMY     and adenoidectomy   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/2019   Procedure: TRANSMETATARSAL AMPUTATION;  Surgeon: Vivi Barrack, DPM;  Location[facility] OR;  Service: Podiatry;  Laterality: Right;   TRANSMETATARSAL AMPUTATION Left 05/11/2021   Procedure: LEFT TRANSMETATARSAL AMPUTATION;  Surgeon: Maeola Harman, MD;  Location: [name] OR;  Service: Vascular;  Laterality: Left;   TUBAL LIGATION      I have reviewed the social history and family history with the patient and they are unchanged from previous note.  ALLERGIES:  is allergic to versed [midazolam], augmentin [amoxicillin-pot clavulanate], bactrim [sulfamethoxazole-trimethoprim], demerol [meperidine], meperidine hcl, scopolamine, and  tramadol.  MEDICATIONS:  Current Outpatient Medications  Medication Sig Dispense Refill   aspirin EC 81 MG tablet Take 81 mg by mouth at bedtime. Swallow whole.     atorvastatin (LIPITOR) 80 MG tablet Take 1 tablet (80 mg total) by mouth daily. 90 tablet 3   brimonidine (ALPHAGAN) 0.2 % ophthalmic solution Place 1 drop into the right eye 2 (two) times daily.     empagliflozin (JARDIANCE) 25 MG TABS tablet Take 1 tablet (25 mg total) by mouth daily before breakfast. 90 tablet 3   hydrochlorothiazide (HYDRODIURIL) 12.5 MG tablet Take 1 tablet (12.5 mg total) by mouth daily. 90 tablet 0   Insulin Lispro Prot & Lispro (HUMALOG MIX 75/25 KWIKPEN) (75-25) 100 UNIT/ML Kwikpen INJECT 8 UNITS SUBCUTANEOUSLY ONCE DAILY WITH BREAKFAST 15 mL 4   Insulin Pen Needle (ULTRA-THIN II MINI PEN NEEDLE) 31G X 5 MM MISC Use as directed to check blood glucose 50 each 2   Lancets (ONETOUCH DELICA PLUS LANCET33G) MISC Use to check blood sugars twice daily. (Patient taking differently: Use to check blood sugars  daily.) 100 each 5   lidocaine-prilocaine (EMLA) cream Apply to affected area once (Patient taking differently: Apply 1 Application topically as needed (port).) 30 g 3   magnesium oxide (MAG-OX) 400 (240 Mg) MG tablet Take 1 tablet (400 mg total) by mouth 2 (two) times daily. 60 tablet 1   metFORMIN (GLUCOPHAGE) 1000 MG tablet Take 1 tablet by mouth twice daily 180 tablet 0   metoprolol tartrate (LOPRESSOR) 50 MG tablet Take 1 tablet (50 mg total) by mouth 2 (two) times daily. 180 tablet 0   Multiple Vitamins-Minerals (MULTI FOR HER PO) Take 1 tablet by mouth daily.     mupirocin ointment (BACTROBAN) 2 % Apply 1 Application topically 2 (two) times daily. (Patient taking differently: Apply 1 Application topically 2 (two) times daily as needed (wound care for toes).) 30 g 2   ondansetron (ZOFRAN) 8 MG tablet Take 1 tablet (8 mg total) by mouth every 8 (eight) hours as needed for nausea or vomiting. Start on the  third day after cisplatin. 30 tablet 1   ONETOUCH VERIO test strip USE TO CHECK BLOOD SUGARS ONCE DAILY 100 each 2   pantoprazole (PROTONIX) 40 MG tablet Take 1 tablet by mouth once daily 90 tablet 3   prochlorperazine (COMPAZINE) 10 MG tablet Take 1 tablet (10 mg total) by mouth every 6 (six) hours as needed (Nausea or vomiting). 30 tablet 1   Propylene Glycol (SYSTANE BALANCE) 0.6 % SOLN Place 1 drop into the left eye 3 (three) times daily as  needed (dry/irritated eyes).     repaglinide (PRANDIN) 2 MG tablet Take 1 tab by breakfast and 1 tab before dinner 180 tablet 2   rivaroxaban (XARELTO) 2.5 MG TABS tablet Take 1 tablet by mouth twice daily 60 tablet 11   telmisartan (MICARDIS) 80 MG tablet Take 1 tablet by mouth once daily 90 tablet 3   No current facility-administered medications for this visit.    PHYSICAL EXAMINATION: ECOG PERFORMANCE STATUS: 1 - Symptomatic but completely ambulatory  Vitals:   08/01/23 1041  BP: (!) 174/62  Pulse: 70  Resp: 18  Temp: 97.6 F (36.4 C)  SpO2: 100%   Wt Readings from Last 3 Encounters:  08/01/23 133 lb 11.2 oz (60.6 kg)  07/12/23 133 lb (60.3 kg)  07/11/23 133 lb 8 oz (60.6 kg)     GENERAL:alert, no distress and comfortable SKIN: skin color, texture, turgor are normal, no rashes or significant lesions EYES: normal, Conjunctiva are pink and non-injected, sclera clear NECK: supple, thyroid normal size, non-tender, without nodularity LYMPH:  no palpable lymphadenopathy in the cervical, axillary  LUNGS: clear to auscultation and percussion with normal breathing effort HEART: regular rate & rhythm and no murmurs and no lower extremity edema ABDOMEN:abdomen soft, non-tender and normal bowel sounds Musculoskeletal:no cyanosis of digits and no clubbing  NEURO: alert & oriented x 3 with fluent speech, no focal motor/sensory deficits    LABORATORY DATA:  I have reviewed the data as listed    Latest Ref Rng & Units 08/01/2023   10:26 AM  07/11/2023   10:59 AM 06/27/2023    7:51 AM  CBC  WBC 4.0 - 10.5 K/uL 10.2  8.5  10.6   Hemoglobin 12.0 - 15.0 g/dL 9.7  9.9  9.3   Hematocrit 36.0 - 46.0 % 29.5  29.7  28.8   Platelets 150 - 400 K/uL 247  209  254         Latest Ref Rng & Units 08/01/2023   10:26 AM 07/11/2023   10:59 AM 06/27/2023    7:51 AM  CMP  Glucose 70 - 99 mg/dL 94  283  151   BUN 8 - 23 mg/dL 21  20  21    Creatinine 0.44 - 1.00 mg/dL 7.61  6.07  3.71   Sodium 135 - 145 mmol/L 139  140  138   Potassium 3.5 - 5.1 mmol/L 4.1  4.6  4.1   Chloride 98 - 111 mmol/L 103  103  101   CO2 22 - 32 mmol/L 29  28  28    Calcium 8.9 - 10.3 mg/dL 9.8  9.8  06.2   Total Protein 6.5 - 8.1 g/dL 6.9  6.9  6.6   Total Bilirubin <1.2 mg/dL 0.4  0.4  0.5   Alkaline Phos 38 - 126 U/L 138  132  153   AST 15 - 41 U/L 32  27  32   ALT 0 - 44 U/L 25  20  30        RADIOGRAPHIC STUDIES: I have personally reviewed the radiological images as listed and agreed with the findings in the report. No results found.    No orders of the defined types were placed in this encounter.  All questions were answered. The patient knows to call the clinic with any problems, questions or concerns. No barriers to learning was detected. The total time spent in the appointment was 30 minutes.     Malachy Mood, MD 08/01/2023

## 2023-08-02 ENCOUNTER — Inpatient Hospital Stay: Payer: Medicare Other

## 2023-08-02 VITALS — BP 167/63 | HR 73 | Temp 97.9°F | Resp 20

## 2023-08-02 DIAGNOSIS — D649 Anemia, unspecified: Secondary | ICD-10-CM | POA: Diagnosis not present

## 2023-08-02 DIAGNOSIS — Z5112 Encounter for antineoplastic immunotherapy: Secondary | ICD-10-CM | POA: Diagnosis not present

## 2023-08-02 DIAGNOSIS — I10 Essential (primary) hypertension: Secondary | ICD-10-CM | POA: Diagnosis not present

## 2023-08-02 DIAGNOSIS — C23 Malignant neoplasm of gallbladder: Secondary | ICD-10-CM | POA: Diagnosis not present

## 2023-08-02 DIAGNOSIS — Z5111 Encounter for antineoplastic chemotherapy: Secondary | ICD-10-CM | POA: Diagnosis not present

## 2023-08-02 LAB — CANCER ANTIGEN 19-9: CA 19-9: 1423 U/mL — ABNORMAL HIGH (ref 0–35)

## 2023-08-02 MED ORDER — SODIUM CHLORIDE 0.9 % IV SOLN
800.0000 mg/m2 | Freq: Once | INTRAVENOUS | Status: AC
  Administered 2023-08-02: 1369 mg via INTRAVENOUS
  Filled 2023-08-02: qty 36.01

## 2023-08-02 MED ORDER — SODIUM CHLORIDE 0.9 % IV SOLN
1500.0000 mg | Freq: Once | INTRAVENOUS | Status: AC
  Administered 2023-08-02: 1500 mg via INTRAVENOUS
  Filled 2023-08-02: qty 30

## 2023-08-02 MED ORDER — HEPARIN SOD (PORK) LOCK FLUSH 100 UNIT/ML IV SOLN
500.0000 [IU] | Freq: Once | INTRAVENOUS | Status: AC | PRN
  Administered 2023-08-02: 500 [IU]

## 2023-08-02 MED ORDER — SODIUM CHLORIDE 0.9 % IV SOLN
150.0000 mg | Freq: Once | INTRAVENOUS | Status: AC
  Administered 2023-08-02: 150 mg via INTRAVENOUS
  Filled 2023-08-02: qty 150

## 2023-08-02 MED ORDER — SODIUM CHLORIDE 0.9% FLUSH
10.0000 mL | INTRAVENOUS | Status: DC | PRN
  Administered 2023-08-02: 10 mL

## 2023-08-02 MED ORDER — POTASSIUM CHLORIDE IN NACL 20-0.9 MEQ/L-% IV SOLN
Freq: Once | INTRAVENOUS | Status: AC
  Filled 2023-08-02: qty 1000

## 2023-08-02 MED ORDER — SODIUM CHLORIDE 0.9 % IV SOLN: Freq: Once | INTRAVENOUS | Status: AC

## 2023-08-02 MED ORDER — PALONOSETRON HCL INJECTION 0.25 MG/5ML
0.2500 mg | Freq: Once | INTRAVENOUS | Status: AC
Start: 2023-08-02 — End: 2023-08-02
  Administered 2023-08-02: 0.25 mg via INTRAVENOUS
  Filled 2023-08-02: qty 5

## 2023-08-02 MED ORDER — SODIUM CHLORIDE 0.9 % IV SOLN: Freq: Once | INTRAVENOUS | Status: DC

## 2023-08-02 MED ORDER — SODIUM CHLORIDE 0.9 % IV SOLN
20.0000 mg/m2 | Freq: Once | INTRAVENOUS | Status: AC
  Administered 2023-08-02: 34 mg via INTRAVENOUS
  Filled 2023-08-02: qty 34

## 2023-08-02 MED ORDER — MAGNESIUM SULFATE 4 GM/100ML IV SOLN
4.0000 g | Freq: Once | INTRAVENOUS | Status: AC
  Administered 2023-08-02: 4 g via INTRAVENOUS
  Filled 2023-08-02: qty 100

## 2023-08-02 NOTE — Patient Instructions (Addendum)
[facility]  Discharge Instructions: Thank you for choosing [facility] to provide your oncology and hematology care.   If you have a lab appointment with the Cancer Center, please go directly to the Cancer Center and check in at the registration area.   Wear comfortable clothing and clothing appropriate for easy access to any Portacath or PICC line.   We strive to give you quality time with your provider. You may need to reschedule your appointment if you arrive late (15 or more minutes).  Arriving late affects you and other patients whose appointments are after yours.  Also, if you miss three or more appointments without notifying the office, you may be dismissed from the clinic at the provider's discretion.      For prescription refill requests, have your pharmacy contact our office and allow 72 hours for refills to be completed.    Today you received the following chemotherapy and/or immunotherapy agents: Imfinzi, Gemzar, Platinol      To help prevent nausea and vomiting after your treatment, we encourage you to take your nausea medication as directed.  BELOW ARE SYMPTOMS THAT SHOULD BE REPORTED IMMEDIATELY: *FEVER GREATER THAN 100.4 F (38 C) OR HIGHER *CHILLS OR SWEATING *NAUSEA AND VOMITING THAT IS NOT CONTROLLED WITH YOUR NAUSEA MEDICATION *UNUSUAL SHORTNESS OF BREATH *UNUSUAL BRUISING OR BLEEDING *URINARY PROBLEMS (pain or burning when urinating, or frequent urination) *BOWEL PROBLEMS (unusual diarrhea, constipation, pain near the anus) TENDERNESS IN MOUTH AND THROAT WITH OR WITHOUT PRESENCE OF ULCERS (sore throat, sores in mouth, or a toothache) UNUSUAL RASH, SWELLING OR PAIN  UNUSUAL VAGINAL DISCHARGE OR ITCHING   Items with * indicate a potential emergency and should be followed up as soon as possible or go to the Emergency Department if any problems should occur.  Please show the CHEMOTHERAPY ALERT CARD  or IMMUNOTHERAPY ALERT CARD at check-in to the Emergency Department and triage nurse.  Should you have questions after your visit or need to cancel or reschedule your appointment, please contact [facility] - A DEPT OF [facility]  Dept: [phone]  and follow the prompts.  Office hours are 8:00 a.m. to 4:30 p.m. Monday - Friday. Please note that voicemails left after 4:00 p.m. may not be returned until the following business day.  We are closed weekends and major holidays. You have access to a nurse at all times for urgent questions. Please call the main number to the clinic Dept: [phone] and follow the prompts.   For any non-urgent questions, you may also contact your provider using MyChart. We now offer e-Visits for anyone 92 and older to request care online for non-urgent symptoms. For details visit [email or web address].   Also download the MyChart app! Go to the app store, search "MyChart", open the app, select [place], and log in with your MyChart username and password.

## 2023-08-05 NOTE — Progress Notes (Signed)
Subjective: Chief Complaint  Patient presents with   Callouses    Trim calluses on amputation areas     82 year old female presents the office today for follow-up evaluation of a wound on her left foot.  States that she has been doing well.  He denies any open lesions.  No drainage or pus.  No swelling or redness that she reports.  No new concerns.    Objective: AAO x3, NAD Status post bilateral transmetatarsal amputations.  Hyperkeratotic lesions noted on the distal aspect of the TMA sites bilaterally.  On the left side there is still some dried blood present is preulcerative but upon debridement there is no underlying ulceration, drainage or any signs of infection noted.  There is no joint erythema, ascending cellulitis there is no fluctuation or crepitation.  There is no malodor. No pain with calf compression, swelling, warmth, erythema   Assessment: Ulceration left foot, PAD  Plan: -All treatment options discussed with the patient including all alternatives, risks, complications.  -Sharply debrided the hyperkeratotic lesions were any complications or bleeding.  There is still preulcerative use continue offloading.  Discussed moisturizer daily.  Monitoring signs or symptoms of infection or skin breakdown. -Monitor for any clinical signs or symptoms of infection and directed to call the office immediately should any occur or go to the ER.  Return in about 9 weeks (around 10/01/2023) for pre-ulcerative calluses .  Vivi Barrack DPM

## 2023-08-06 ENCOUNTER — Ambulatory Visit: Payer: Medicare Other | Admitting: Podiatry

## 2023-08-08 ENCOUNTER — Other Ambulatory Visit: Payer: Medicare Other

## 2023-08-08 ENCOUNTER — Ambulatory Visit: Payer: Medicare Other | Admitting: Hematology

## 2023-08-08 DIAGNOSIS — C229 Malignant neoplasm of liver, not specified as primary or secondary: Secondary | ICD-10-CM | POA: Diagnosis not present

## 2023-08-08 DIAGNOSIS — R896 Abnormal cytological findings in specimens from other organs, systems and tissues: Secondary | ICD-10-CM | POA: Diagnosis not present

## 2023-08-08 DIAGNOSIS — C23 Malignant neoplasm of gallbladder: Secondary | ICD-10-CM | POA: Diagnosis not present

## 2023-08-08 DIAGNOSIS — R8569 Abnormal cytological findings in specimens from other digestive organs and abdominal cavity: Secondary | ICD-10-CM | POA: Diagnosis not present

## 2023-08-09 ENCOUNTER — Ambulatory Visit: Payer: Medicare Other

## 2023-08-09 DIAGNOSIS — C23 Malignant neoplasm of gallbladder: Secondary | ICD-10-CM | POA: Diagnosis not present

## 2023-08-14 NOTE — Assessment & Plan Note (Addendum)
cT4N0M0, stage III -Patient had 1 episode of abdominal pain in October 2023, and was found to have a large gallbladder stone. -She underwent laparoscopic attempted cholecystectomy but was not able to be resected due to adhesions to duodenum and colon. -She developed jaundice in June 2024, status post ERCP and stent placement in CBD.  CT and MRI scan showed a liver mass adjacent to the gallbladder fossae, and a biopsy confirmed adenocarcinoma.  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  Unfortunately this was felt to be not resectable by Dr. Freida Busman. -I reviewed her CT chest from April 22, 2023, which showed no evidence of distant metastasis. -I recommended first-line cisplatin, gemcitabine  every 14 days and durvalumab every 28 days.  If she responded well, will stop chemo after 4 to 6 months of treatment, and changed to maintenance therapy. She started on 05/03/23 every 2 weeks and has been tolerating well.  We have also discussed option of consolidation radiation at some point. -restaging CT 07/08/2023 showed partial response in the primary site tumor with direct invasion to liver.  No new disease or question.  Will continue chemotherapy. -she was referred to liver surgeon, Dr. Gwenlyn Perking, for surgical consultation of her case. It was felt she should continue with chemotherapy and consider radiation therapy in the future. -tomorrow, 08/16/2023, she will present for Cycle 4 day 15 of current chemotherapy.

## 2023-08-14 NOTE — Progress Notes (Unsigned)
Patient Care Team: Shon Hale, MD as PCP - General (Family Medicine) Runell Gess, MD as PCP - Cardiology (Cardiology) Malachy Mood, MD as Consulting Physician (Oncology)  Clinic Day:  08/15/2023  Referring physician: Malachy Mood, MD  ASSESSMENT & PLAN:   Assessment & Plan: Gallbladder cancer (HCC) cT4N0M0, stage III -Patient had 1 episode of abdominal pain in October 2023, and was found to have a large gallbladder stone. -She underwent laparoscopic attempted cholecystectomy but was not able to be resected due to adhesions to duodenum and colon. -She developed jaundice in June 2024, status post ERCP and stent placement in CBD.  CT and MRI scan showed a liver mass adjacent to the gallbladder fossae, and a biopsy confirmed adenocarcinoma.  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  Unfortunately this was felt to be not resectable by Dr. Freida Busman. -I reviewed her CT chest from April 22, 2023, which showed no evidence of distant metastasis. -I recommended first-line cisplatin, gemcitabine  every 14 days and durvalumab every 28 days.  If she responded well, will stop chemo after 4 to 6 months of treatment, and changed to maintenance therapy. She started on 05/03/23 every 2 weeks and has been tolerating well.  We have also discussed option of consolidation radiation at some point. -restaging CT 07/08/2023 showed partial response in the primary site tumor with direct invasion to liver.  No new disease or question.  Will continue chemotherapy. -she was referred to liver surgeon, Dr. Gwenlyn Perking, for surgical consultation of her case. It was felt she should continue with chemotherapy and consider radiation therapy in the future.    Plan: Labs reviewed  -CBC showing WBC 9.5; Hgb 9.8; Hct 30.5; Plt 188; Anc 7.3 -CMP - K 4.0; glucose 194; BUN 27; Creatinine 1.00; eGFR 57; Ca 9.9; LFTs normal.    -magnesium - 1.7 Reviewed visit with Dr. Gwenlyn Perking who does not recommend surgery as an option for her. He recommended continued chemotherapy, followed by possible radiation in the future, to control current cancer. She voiced understanding and plans to discuss possibility of radiation with Dr. Mosetta Putt at her next visit.  08/16/2023 - begin Cycle 4 day 15  The patient understands the plans discussed today and is in agreement with them.  She knows to contact our office if she develops concerns prior to her next appointment.  I provided 25 minutes of face-to-face time during this encounter and > 50% was spent counseling as documented under my assessment and plan.    Carlean Jews, NP  [facility] - [facility] [address] [place] [address] Dept: [phone] Dept Fax: [phone]   No orders of the defined types were placed in this encounter.     CHIEF COMPLAINT:  CC: gallbladder cancer  Current Treatment:  First-line chemotherapy cisplatin and gemcitabine every 2 weeks, durvalumab every 4 weeks   INTERVAL HISTORY:  Tomarra is here today for repeat clinical assessment. She was last seen by Dr. Mosetta Putt on 08/01/2023. CT CAP done 07/08/2023 showed partial response to chemotherapy in the primary site tumor with direct invasion of liver. Tumor marker CA 19.9 has been trending down.  Patient was referred to Dr. Gwenlyn Perking, liver surgeon, for consultation. He did not feel that surgery was the best option. It was felt she should continue with chemotherapy and consider radiation therapy in  the future. For now, plan to continue with chemotherapy as scheduled. Today, she presents for Cycle 4 day 15, which will be administered tomorrow. She denies chest pain, chest pressure, or shortness of breath. She denies headaches or visual disturbances. She denies abdominal pain, nausea, vomiting, or changes in bowel or bladder habits.   She states that she gets "uneasy" feeling in her stomach after she eats. She is able to eat what she wants without too much difficulty. Her weight has increased 2 pounds over last 2 weeks .  I have reviewed the past medical history, past surgical history, social history and family history with the patient and they are unchanged from previous note.  ALLERGIES:  is allergic to versed [midazolam], augmentin [amoxicillin-pot clavulanate], bactrim [sulfamethoxazole-trimethoprim], demerol [meperidine], meperidine hcl, scopolamine, and tramadol.  MEDICATIONS:  Current Outpatient Medications  Medication Sig Dispense Refill   aspirin EC 81 MG tablet Take 81 mg by mouth at bedtime. Swallow whole.     atorvastatin (LIPITOR) 80 MG tablet Take 1 tablet (80 mg total) by mouth daily. 90 tablet 3   brimonidine (ALPHAGAN) 0.2 % ophthalmic solution Place 1 drop into the right eye 2 (two) times daily.     empagliflozin (JARDIANCE) 25 MG TABS tablet Take 1 tablet (25 mg total) by mouth daily before breakfast. 90 tablet 3   hydrochlorothiazide (HYDRODIURIL) 12.5 MG tablet Take 1 tablet (12.5 mg total) by mouth daily. 90 tablet 0   Insulin Lispro Prot & Lispro (HUMALOG MIX 75/25 KWIKPEN) (75-25) 100 UNIT/ML Kwikpen INJECT 8 UNITS SUBCUTANEOUSLY ONCE DAILY WITH BREAKFAST 15 mL 4   Insulin Pen Needle (ULTRA-THIN II MINI PEN NEEDLE) 31G X 5 MM MISC Use as directed to check blood glucose 50 each 2   Lancets (ONETOUCH DELICA PLUS LANCET33G) MISC Use to check blood sugars twice daily. (Patient taking differently: Use to check blood sugars  daily.) 100 each 5   lidocaine-prilocaine (EMLA) cream Apply to affected area once (Patient taking differently: Apply 1 Application topically as needed (port).) 30 g 3   magnesium oxide (MAG-OX) 400 (240 Mg) MG tablet Take 1 tablet (400 mg total) by mouth 2 (two) times daily. 60 tablet 1   metFORMIN (GLUCOPHAGE) 1000 MG tablet Take 1 tablet by mouth twice daily 180 tablet 0   metoprolol  tartrate (LOPRESSOR) 50 MG tablet Take 1 tablet (50 mg total) by mouth 2 (two) times daily. 180 tablet 0   Multiple Vitamins-Minerals (MULTI FOR HER PO) Take 1 tablet by mouth daily.     mupirocin ointment (BACTROBAN) 2 % Apply 1 Application topically 2 (two) times daily. (Patient taking differently: Apply 1 Application topically 2 (two) times daily as needed (wound care for toes).) 30 g 2   ondansetron (ZOFRAN) 8 MG tablet Take 1 tablet (8 mg total) by mouth every 8 (eight) hours as needed for nausea or vomiting. Start on the third day after cisplatin. 30 tablet 1   ONETOUCH VERIO test strip USE TO CHECK BLOOD SUGARS ONCE DAILY 100 each 2   pantoprazole (PROTONIX) 40 MG tablet Take 1 tablet by mouth once daily 90 tablet 3   prochlorperazine (COMPAZINE) 10 MG tablet Take 1 tablet (10 mg total) by mouth every 6 (six) hours as needed (Nausea or vomiting). 30 tablet 1   Propylene Glycol (SYSTANE BALANCE) 0.6 % SOLN Place 1 drop into the left eye 3 (three) times daily as needed (dry/irritated eyes).     repaglinide (PRANDIN) 2 MG tablet Take 1 tab by breakfast and  1 tab before dinner 180 tablet 2   rivaroxaban (XARELTO) 2.5 MG TABS tablet Take 1 tablet by mouth twice daily 60 tablet 11   telmisartan (MICARDIS) 80 MG tablet Take 1 tablet by mouth once daily 90 tablet 3   No current facility-administered medications for this visit.    HISTORY OF PRESENT ILLNESS:   Oncology History Overview Note   Cancer Staging  Gallbladder cancer [facility]) Staging form: Distal Bile Duct, AJCC 8th Edition - Clinical stage from 04/09/2023: Stage IIIB (cT4, cN0, cM0) - Signed by Malachy Mood, MD on 04/18/2023 Total positive nodes: 0 Histologic grade (G): G2 Histologic grading system: 3 grade system     Gallbladder cancer (HCC)  04/04/2023 Imaging    IMPRESSION: 1. Ill-defined hypodensity in the region of the gallbladder fossa appears similar to the prior study given lack of contrast. This would be better evaluated  with dedicated MRI. 2. Nonobstructing right renal calculus. 3. Large amount of stool throughout the colon. 4. Stable indeterminate lucent areas in the vertebral bodies of the lumbar spine. 5. Severe atherosclerotic disease.     04/09/2023 Pathology Results     FINAL MICROSCOPIC DIAGNOSIS:   A. LIVER, ADJACENT TO GALLBLADDER FOSSA, BIOPSY:  - Moderately differentiated adenocarcinoma    04/09/2023 Cancer Staging   Staging form: Distal Bile Duct, AJCC 8th Edition - Clinical stage from 04/09/2023: Stage IIIB (cT4, cN0, cM0) - Signed by Malachy Mood, MD on 04/18/2023 Total positive nodes: 0 Histologic grade (G): G2 Histologic grading system: 3 grade system   04/18/2023 Initial Diagnosis   Cholangiocarcinoma of liver (HCC)   05/03/2023 -  Chemotherapy   Patient is on Treatment Plan : BILIARY TRACT Cisplatin + Gemcitabine D1,15 + Durvalumab (1500) D1 q28d / Durvalumab (1500) q28d     07/08/2023 Imaging   CT Chest abdomen and pelvis with contrast  IMPRESSION: CT CHEST IMPRESSION  1.  No acute process or evidence of metastatic disease in the chest. 2. Left Port-A-Cath tip in azygous vein. This is being adjusted by interventional radiology later today. 3. Ongoing stability of tiny bilateral pulmonary nodules, favored to be benign. 4.  Aortic Atherosclerosis (ICD10-I70.0).  CT ABDOMEN AND PELVIS IMPRESSION  1. The segment 5 liver lesion is decreased in size compared to the MRI of 04/05/2023. 2. Placement of a biliary stent with improvement, near complete resolution of duct dilatation. 3. No abdominopelvic adenopathy. 4. Proximal gastric underdistention. Apparent wall thickening could be secondary. Correlate with symptoms of gastritis 5. Subtle findings within the superior spleen for which small volume interval splenic infarct cannot be excluded. Correlate with left upper quadrant symptoms. 6.  Possible constipation.         REVIEW OF SYSTEMS:   Constitutional: Denies fevers, chills or  abnormal weight loss. Some fatigue Eyes: Denies blurriness of vision Ears, nose, mouth, throat, and face: Denies mucositis or sore throat Respiratory: Denies cough, dyspnea or wheezes Cardiovascular: Denies palpitation, chest discomfort or lower extremity swelling Gastrointestinal:  Denies nausea, heartburn or change in bowel habits Skin: Denies abnormal skin rashes Lymphatics: Denies new lymphadenopathy or easy bruising Neurological:Denies numbness, tingling or new weaknesses. 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 This was present before she started on chemotherapy.  Behavioral/Psych: Mood is stable, no new changes  All other systems were reviewed with the patient and are negative.   VITALS:   Today's Vitals   08/15/23 0928  BP: 120/60  Pulse: 87  Resp: 17  Temp: 98 F (36.7 C)  TempSrc: Oral  SpO2: 100%  Weight: 135 lb (61.2 kg)   Body mass index is 22.12 kg/m.    Wt Readings from Last 3 Encounters:  08/15/23 135 lb (61.2 kg)  08/01/23 133 lb 11.2 oz (60.6 kg)  07/12/23 133 lb (60.3 kg)    Body mass index is 22.12 kg/m.  Performance status (ECOG): 1 - Symptomatic but completely ambulatory  PHYSICAL EXAM:   GENERAL:alert, no distress and comfortable SKIN: skin color, texture, turgor are normal, no rashes or significant lesions EYES: normal, Conjunctiva are pink and non-injected, sclera clear OROPHARYNX:no exudate, no erythema and lips, buccal mucosa, and tongue normal  NECK: supple, thyroid normal size, non-tender, without nodularity LYMPH:  no palpable lymphadenopathy in the cervical, axillary or inguinal LUNGS: clear to auscultation and percussion with normal breathing effort HEART: regular rate & rhythm and no murmurs and no lower extremity edema ABDOMEN:abdomen soft, non-tender and normal bowel sounds Musculoskeletal:no cyanosis of digits and no clubbing  NEURO: alert & oriented x 3 with fluent speech, no focal motor/sensory deficits  LABORATORY  DATA:  I have reviewed the data as listed    Component Value Date/Time   NA 138 08/15/2023 0858   NA 142 07/29/2020 0942   K 4.0 08/15/2023 0858   CL 104 08/15/2023 0858   CO2 27 08/15/2023 0858   GLUCOSE 194 (H) 08/15/2023 0858   BUN 27 (H) 08/15/2023 0858   BUN 17 07/29/2020 0942   CREATININE 1.00 08/15/2023 0858   CREATININE 0.96 10/12/2021 1644   CALCIUM 9.9 08/15/2023 0858   PROT 6.7 08/15/2023 0858   PROT 6.3 07/28/2020 0945   ALBUMIN 3.7 08/15/2023 0858   ALBUMIN 4.2 07/28/2020 0945   AST 23 08/15/2023 0858   ALT 15 08/15/2023 0858   ALKPHOS 126 08/15/2023 0858   BILITOT 0.3 08/15/2023 0858   GFRNONAA 57 (L) 08/15/2023 0858   GFRAA 75 07/29/2020 0942    Lab Results  Component Value Date   WBC 9.5 08/15/2023   NEUTROABS 7.3 08/15/2023   HGB 9.8 (L) 08/15/2023   HCT 30.5 (L) 08/15/2023   MCV 101.3 (H) 08/15/2023   PLT 188 08/15/2023

## 2023-08-15 ENCOUNTER — Encounter: Payer: Self-pay | Admitting: Nurse Practitioner

## 2023-08-15 ENCOUNTER — Inpatient Hospital Stay (HOSPITAL_BASED_OUTPATIENT_CLINIC_OR_DEPARTMENT_OTHER): Payer: Medicare Other | Admitting: Nurse Practitioner

## 2023-08-15 ENCOUNTER — Inpatient Hospital Stay: Payer: Medicare Other

## 2023-08-15 VITALS — BP 120/60 | HR 87 | Temp 98.0°F | Resp 17 | Wt 135.0 lb

## 2023-08-15 DIAGNOSIS — C23 Malignant neoplasm of gallbladder: Secondary | ICD-10-CM | POA: Diagnosis not present

## 2023-08-15 DIAGNOSIS — I10 Essential (primary) hypertension: Secondary | ICD-10-CM | POA: Diagnosis not present

## 2023-08-15 DIAGNOSIS — Z5111 Encounter for antineoplastic chemotherapy: Secondary | ICD-10-CM | POA: Diagnosis not present

## 2023-08-15 DIAGNOSIS — Z95828 Presence of other vascular implants and grafts: Secondary | ICD-10-CM

## 2023-08-15 DIAGNOSIS — D649 Anemia, unspecified: Secondary | ICD-10-CM | POA: Diagnosis not present

## 2023-08-15 DIAGNOSIS — Z5112 Encounter for antineoplastic immunotherapy: Secondary | ICD-10-CM | POA: Diagnosis not present

## 2023-08-15 LAB — CMP (CANCER CENTER ONLY)
ALT: 15 U/L (ref 0–44)
AST: 23 U/L (ref 15–41)
Albumin: 3.7 g/dL (ref 3.5–5.0)
Alkaline Phosphatase: 126 U/L (ref 38–126)
Anion gap: 7 (ref 5–15)
BUN: 27 mg/dL — ABNORMAL HIGH (ref 8–23)
CO2: 27 mmol/L (ref 22–32)
Calcium: 9.9 mg/dL (ref 8.9–10.3)
Chloride: 104 mmol/L (ref 98–111)
Creatinine: 1 mg/dL (ref 0.44–1.00)
GFR, Estimated: 57 mL/min — ABNORMAL LOW (ref >60)
Glucose, Bld: 194 mg/dL — ABNORMAL HIGH (ref 70–99)
Potassium: 4 mmol/L (ref 3.5–5.1)
Sodium: 138 mmol/L (ref 135–145)
Total Bilirubin: 0.3 mg/dL (ref <1.2)
Total Protein: 6.7 g/dL (ref 6.5–8.1)

## 2023-08-15 LAB — CBC WITH DIFFERENTIAL (CANCER CENTER ONLY)
Abs Immature Granulocytes: 0.04 10*3/uL (ref 0.00–0.07)
Basophils Absolute: 0 10*3/uL (ref 0.0–0.1)
Basophils Relative: 0 %
Eosinophils Absolute: 0.1 10*3/uL (ref 0.0–0.5)
Eosinophils Relative: 1 %
HCT: 30.5 % — ABNORMAL LOW (ref 36.0–46.0)
Hemoglobin: 9.8 g/dL — ABNORMAL LOW (ref 12.0–15.0)
Immature Granulocytes: 0 %
Lymphocytes Relative: 9 %
Lymphs Abs: 0.9 10*3/uL (ref 0.7–4.0)
MCH: 32.6 pg (ref 26.0–34.0)
MCHC: 32.1 g/dL (ref 30.0–36.0)
MCV: 101.3 fL — ABNORMAL HIGH (ref 80.0–100.0)
Monocytes Absolute: 1.2 10*3/uL — ABNORMAL HIGH (ref 0.1–1.0)
Monocytes Relative: 13 %
Neutro Abs: 7.3 10*3/uL (ref 1.7–7.7)
Neutrophils Relative %: 77 %
Platelet Count: 188 10*3/uL (ref 150–400)
RBC: 3.01 MIL/uL — ABNORMAL LOW (ref 3.87–5.11)
RDW: 18.1 % — ABNORMAL HIGH (ref 11.5–15.5)
WBC Count: 9.5 10*3/uL (ref 4.0–10.5)
nRBC: 0 % (ref 0.0–0.2)

## 2023-08-15 LAB — TSH: TSH: 0.909 u[IU]/mL (ref 0.350–4.500)

## 2023-08-15 LAB — MAGNESIUM: Magnesium: 1.7 mg/dL (ref 1.7–2.4)

## 2023-08-15 MED ORDER — HEPARIN SOD (PORK) LOCK FLUSH 100 UNIT/ML IV SOLN
500.0000 [IU] | Freq: Once | INTRAVENOUS | Status: AC
  Administered 2023-08-15: 500 [IU]

## 2023-08-15 MED FILL — Fosaprepitant Dimeglumine For IV Infusion 150 MG (Base Eq): INTRAVENOUS | Qty: 5 | Status: AC

## 2023-08-15 NOTE — Addendum Note (Signed)
Addended by: Ovid Curd R on: 08/15/2023 05:18 PM   Modules accepted: Level of Service

## 2023-08-16 ENCOUNTER — Other Ambulatory Visit: Payer: Self-pay | Admitting: Hematology

## 2023-08-16 ENCOUNTER — Inpatient Hospital Stay: Payer: Medicare Other

## 2023-08-16 VITALS — BP 133/48 | HR 76 | Temp 97.9°F | Resp 9

## 2023-08-16 DIAGNOSIS — C23 Malignant neoplasm of gallbladder: Secondary | ICD-10-CM

## 2023-08-16 DIAGNOSIS — Z5111 Encounter for antineoplastic chemotherapy: Secondary | ICD-10-CM | POA: Diagnosis not present

## 2023-08-16 DIAGNOSIS — I10 Essential (primary) hypertension: Secondary | ICD-10-CM | POA: Diagnosis not present

## 2023-08-16 DIAGNOSIS — D649 Anemia, unspecified: Secondary | ICD-10-CM | POA: Diagnosis not present

## 2023-08-16 DIAGNOSIS — Z5112 Encounter for antineoplastic immunotherapy: Secondary | ICD-10-CM | POA: Diagnosis not present

## 2023-08-16 LAB — T4: T4, Total: 13.6 ug/dL — ABNORMAL HIGH (ref 4.5–12.0)

## 2023-08-16 MED ORDER — SODIUM CHLORIDE 0.9% FLUSH
10.0000 mL | INTRAVENOUS | Status: DC | PRN
  Administered 2023-08-16: 10 mL

## 2023-08-16 MED ORDER — SODIUM CHLORIDE 0.9 % IV SOLN
20.0000 mg/m2 | Freq: Once | INTRAVENOUS | Status: AC
  Administered 2023-08-16: 34 mg via INTRAVENOUS
  Filled 2023-08-16: qty 34

## 2023-08-16 MED ORDER — MAGNESIUM SULFATE 4 GM/100ML IV SOLN
4.0000 g | Freq: Once | INTRAVENOUS | Status: DC
  Filled 2023-08-16: qty 100

## 2023-08-16 MED ORDER — SODIUM CHLORIDE 0.9 % IV SOLN
800.0000 mg/m2 | Freq: Once | INTRAVENOUS | Status: AC
  Administered 2023-08-16: 1369 mg via INTRAVENOUS
  Filled 2023-08-16: qty 36.01

## 2023-08-16 MED ORDER — PALONOSETRON HCL INJECTION 0.25 MG/5ML
0.2500 mg | Freq: Once | INTRAVENOUS | Status: AC
  Administered 2023-08-16: 0.25 mg via INTRAVENOUS
  Filled 2023-08-16: qty 5

## 2023-08-16 MED ORDER — SODIUM CHLORIDE 0.9 % IV SOLN: Freq: Once | INTRAVENOUS | Status: AC

## 2023-08-16 MED ORDER — MAGNESIUM SULFATE 2 GM/50ML IV SOLN
2.0000 g | Freq: Once | INTRAVENOUS | Status: AC
  Administered 2023-08-16: 2 g via INTRAVENOUS
  Filled 2023-08-16: qty 50

## 2023-08-16 MED ORDER — HEPARIN SOD (PORK) LOCK FLUSH 100 UNIT/ML IV SOLN
500.0000 [IU] | Freq: Once | INTRAVENOUS | Status: AC | PRN
  Administered 2023-08-16: 500 [IU]

## 2023-08-16 MED ORDER — SODIUM CHLORIDE 0.9 % IV SOLN
150.0000 mg | Freq: Once | INTRAVENOUS | Status: AC
  Administered 2023-08-16: 150 mg via INTRAVENOUS
  Filled 2023-08-16: qty 150

## 2023-08-16 MED ORDER — POTASSIUM CHLORIDE IN NACL 20-0.9 MEQ/L-% IV SOLN
Freq: Once | INTRAVENOUS | Status: AC
  Filled 2023-08-16: qty 1000

## 2023-08-16 NOTE — Progress Notes (Signed)
Per Rodman Key, NP - okay to run post-hydration fluids with cisplatin infusion.

## 2023-08-16 NOTE — Patient Instructions (Addendum)
[facility]   Discharge Instructions: Thank you for choosing [facility] to provide your oncology and hematology care.   If you have a lab appointment with the Cancer Center, please go directly to the Cancer Center and check in at the registration area.   Wear comfortable clothing and clothing appropriate for easy access to any Portacath or PICC line.   We strive to give you quality time with your provider. You may need to reschedule your appointment if you arrive late (15 or more minutes).  Arriving late affects you and other patients whose appointments are after yours.  Also, if you miss three or more appointments without notifying the office, you may be dismissed from the clinic at the provider's discretion.      For prescription refill requests, have your pharmacy contact our office and allow 72 hours for refills to be completed.    Today you received the following chemotherapy and/or immunotherapy agents: Gemcitabine, Cisplatin      To help prevent nausea and vomiting after your treatment, we encourage you to take your nausea medication as directed.  BELOW ARE SYMPTOMS THAT SHOULD BE REPORTED IMMEDIATELY: *FEVER GREATER THAN 100.4 F (38 C) OR HIGHER *CHILLS OR SWEATING *NAUSEA AND VOMITING THAT IS NOT CONTROLLED WITH YOUR NAUSEA MEDICATION *UNUSUAL SHORTNESS OF BREATH *UNUSUAL BRUISING OR BLEEDING *URINARY PROBLEMS (pain or burning when urinating, or frequent urination) *BOWEL PROBLEMS (unusual diarrhea, constipation, pain near the anus) TENDERNESS IN MOUTH AND THROAT WITH OR WITHOUT PRESENCE OF ULCERS (sore throat, sores in mouth, or a toothache) UNUSUAL RASH, SWELLING OR PAIN  UNUSUAL VAGINAL DISCHARGE OR ITCHING   Items with * indicate a potential emergency and should be followed up as soon as possible or go to the Emergency Department if any problems should occur.  Please show the CHEMOTHERAPY ALERT CARD  or IMMUNOTHERAPY ALERT CARD at check-in to the Emergency Department and triage nurse.  Should you have questions after your visit or need to cancel or reschedule your appointment, please contact [facility] - A DEPT OF [facility]  Dept: [phone]  and follow the prompts.  Office hours are 8:00 a.m. to 4:30 p.m. Monday - Friday. Please note that voicemails left after 4:00 p.m. may not be returned until the following business day.  We are closed weekends and major holidays. You have access to a nurse at all times for urgent questions. Please call the main number to the clinic Dept: [phone] and follow the prompts.   For any non-urgent questions, you may also contact your provider using MyChart. We now offer e-Visits for anyone 1 and older to request care online for non-urgent symptoms. For details visit [email or web address].   Also download the MyChart app! Go to the app store, search "MyChart", open the app, select [place], and log in with your MyChart username and password.

## 2023-08-19 ENCOUNTER — Other Ambulatory Visit: Payer: Self-pay | Admitting: Cardiovascular Disease

## 2023-08-19 ENCOUNTER — Other Ambulatory Visit: Payer: Self-pay | Admitting: Hematology

## 2023-08-19 NOTE — Telephone Encounter (Signed)
Can you please verify if you would like to continue refilling metformin

## 2023-08-21 ENCOUNTER — Other Ambulatory Visit: Payer: Self-pay

## 2023-08-21 ENCOUNTER — Other Ambulatory Visit: Payer: Self-pay | Admitting: Hematology

## 2023-08-21 ENCOUNTER — Telehealth: Payer: Self-pay | Admitting: Cardiovascular Disease

## 2023-08-21 MED ORDER — METFORMIN HCL 1000 MG PO TABS: 1000.0000 mg | ORAL_TABLET | Freq: Two times a day (BID) | ORAL | 2 refills | Status: DC

## 2023-08-21 NOTE — Telephone Encounter (Signed)
RX sent to requested Pharmacy

## 2023-08-21 NOTE — Telephone Encounter (Signed)
*  STAT* If patient is at the pharmacy, call can be transferred to refill team.   1. Which medications need to be refilled? (please list name of each medication and dose if known)   metFORMIN (GLUCOPHAGE) 1000 MG tablet    2. Which pharmacy/location (including street and city if local pharmacy) is medication to be sent to? [facility] [address] [place] - [address]    3. Do they need a 30 day or 90 day supply? 90 day

## 2023-08-29 ENCOUNTER — Inpatient Hospital Stay: Payer: Medicare Other | Attending: Hematology | Admitting: Hematology

## 2023-08-29 ENCOUNTER — Encounter: Payer: Self-pay | Admitting: Hematology

## 2023-08-29 ENCOUNTER — Inpatient Hospital Stay: Payer: Medicare Other

## 2023-08-29 VITALS — BP 129/55 | HR 90 | Temp 98.1°F | Resp 12 | Wt 135.3 lb

## 2023-08-29 DIAGNOSIS — Z452 Encounter for adjustment and management of vascular access device: Secondary | ICD-10-CM | POA: Insufficient documentation

## 2023-08-29 DIAGNOSIS — N200 Calculus of kidney: Secondary | ICD-10-CM | POA: Diagnosis not present

## 2023-08-29 DIAGNOSIS — D649 Anemia, unspecified: Secondary | ICD-10-CM | POA: Diagnosis not present

## 2023-08-29 DIAGNOSIS — Z5111 Encounter for antineoplastic chemotherapy: Secondary | ICD-10-CM | POA: Diagnosis not present

## 2023-08-29 DIAGNOSIS — E1165 Type 2 diabetes mellitus with hyperglycemia: Secondary | ICD-10-CM | POA: Insufficient documentation

## 2023-08-29 DIAGNOSIS — Z5112 Encounter for antineoplastic immunotherapy: Secondary | ICD-10-CM | POA: Diagnosis not present

## 2023-08-29 DIAGNOSIS — Z79620 Long term (current) use of immunosuppressive biologic: Secondary | ICD-10-CM | POA: Diagnosis not present

## 2023-08-29 DIAGNOSIS — Z95828 Presence of other vascular implants and grafts: Secondary | ICD-10-CM

## 2023-08-29 DIAGNOSIS — C23 Malignant neoplasm of gallbladder: Secondary | ICD-10-CM

## 2023-08-29 LAB — CBC WITH DIFFERENTIAL (CANCER CENTER ONLY)
Abs Immature Granulocytes: 0.04 10*3/uL (ref 0.00–0.07)
Basophils Absolute: 0 10*3/uL (ref 0.0–0.1)
Basophils Relative: 0 %
Eosinophils Absolute: 0.1 10*3/uL (ref 0.0–0.5)
Eosinophils Relative: 1 %
HCT: 29.8 % — ABNORMAL LOW (ref 36.0–46.0)
Hemoglobin: 9.5 g/dL — ABNORMAL LOW (ref 12.0–15.0)
Immature Granulocytes: 0 %
Lymphocytes Relative: 11 %
Lymphs Abs: 1.1 10*3/uL (ref 0.7–4.0)
MCH: 32.4 pg (ref 26.0–34.0)
MCHC: 31.9 g/dL (ref 30.0–36.0)
MCV: 101.7 fL — ABNORMAL HIGH (ref 80.0–100.0)
Monocytes Absolute: 1.1 10*3/uL — ABNORMAL HIGH (ref 0.1–1.0)
Monocytes Relative: 11 %
Neutro Abs: 7.9 10*3/uL — ABNORMAL HIGH (ref 1.7–7.7)
Neutrophils Relative %: 77 %
Platelet Count: 175 10*3/uL (ref 150–400)
RBC: 2.93 MIL/uL — ABNORMAL LOW (ref 3.87–5.11)
RDW: 17.2 % — ABNORMAL HIGH (ref 11.5–15.5)
WBC Count: 10.3 10*3/uL (ref 4.0–10.5)
nRBC: 0 % (ref 0.0–0.2)

## 2023-08-29 LAB — CMP (CANCER CENTER ONLY)
ALT: 15 U/L (ref 0–44)
AST: 23 U/L (ref 15–41)
Albumin: 3.7 g/dL (ref 3.5–5.0)
Alkaline Phosphatase: 120 U/L (ref 38–126)
Anion gap: 7 (ref 5–15)
BUN: 29 mg/dL — ABNORMAL HIGH (ref 8–23)
CO2: 28 mmol/L (ref 22–32)
Calcium: 9.5 mg/dL (ref 8.9–10.3)
Chloride: 103 mmol/L (ref 98–111)
Creatinine: 1.08 mg/dL — ABNORMAL HIGH (ref 0.44–1.00)
GFR, Estimated: 52 mL/min — ABNORMAL LOW (ref >60)
Glucose, Bld: 245 mg/dL — ABNORMAL HIGH (ref 70–99)
Potassium: 4.4 mmol/L (ref 3.5–5.1)
Sodium: 138 mmol/L (ref 135–145)
Total Bilirubin: 0.3 mg/dL (ref <1.2)
Total Protein: 6.5 g/dL (ref 6.5–8.1)

## 2023-08-29 LAB — MAGNESIUM: Magnesium: 1.8 mg/dL (ref 1.7–2.4)

## 2023-08-29 MED ORDER — HEPARIN SOD (PORK) LOCK FLUSH 100 UNIT/ML IV SOLN
500.0000 [IU] | Freq: Once | INTRAVENOUS | Status: AC
  Administered 2023-08-29: 500 [IU]

## 2023-08-29 MED ORDER — SODIUM CHLORIDE 0.9% FLUSH
10.0000 mL | Freq: Once | INTRAVENOUS | Status: AC
  Administered 2023-08-29: 10 mL

## 2023-08-29 MED FILL — Fosaprepitant Dimeglumine For IV Infusion 150 MG (Base Eq): INTRAVENOUS | Qty: 5 | Status: AC

## 2023-08-29 NOTE — Progress Notes (Signed)
[facility]   Telephone:(336) [phone] Fax:(336) [phone]   Clinic Follow up Note   Patient Care Team: Shon Hale, MD as PCP - General (Family Medicine) Runell Gess, MD as PCP - Cardiology (Cardiology) Malachy Mood, MD as Consulting Physician (Oncology)  Date of Service:  08/29/2023  CHIEF COMPLAINT: f/u of gallbladder cancer  CURRENT THERAPY:  First-line chemotherapy cisplatin and gemcitabine every 2 weeks and durvalumab every 4 weeks  Oncology History   Gallbladder cancer (HCC) cT4N0M0, stage III -Patient had 1 episode of abdominal pain in October 2023, and was found to have a large gallbladder stone. -She underwent laparoscopic attempted cholecystectomy but was not able to be resected due to adhesions to duodenum and colon. -She developed jaundice in June 2024, status post ERCP and stent placement in CBD.  CT and MRI scan showed a liver mass adjacent to the gallbladder fossae, and a biopsy confirmed adenocarcinoma.  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  Unfortunately this was felt to be not resectable by Dr. Freida Busman. -I reviewed her CT chest from April 22, 2023, which showed no evidence of distant metastasis. -I recommended first-line cisplatin, gemcitabine  every 14 days and durvalumab every 28 days.  If she responded well, will stop chemo after 4 to 6 months of treatment, and changed to maintenance therapy. She started on 05/03/23 every 2 weeks and has been tolerating well.  We have also discussed option of consolidation radiation at some point. -restaging CT 07/08/2023 showed partial response in the primary site tumor with direct invasion to liver.  No new disease or question.  Will continue chemotherapy.    Assessment and Plan    Gallbladder Cancer [age]-year-old with gallbladder cancer, currently undergoing gemcitabine chemotherapy.  Surgery is not an option per Dr. Gwenlyn Perking. Patient tolerates chemotherapy well with minor chills and shaking post-treatment, likely gemcitabine-related. No fever. Mild anemia noted. Blood glucose elevated at 245 mg/dL, likely due to Boost drink. Discussed potential switch to immunotherapy alone after cycle six in late January; patient prefers current regimen due to perceived efficacy. Radiation therapy to be considered after next scan in mid-January. - Continue chemotherapy every two weeks - Consider switching to immunotherapy alone after cycle six in late January - Evaluate for potential radiation therapy after next scan in mid-January - Order scan for mid-January - Review scan results on January 17 - Advise use of Tylenol or warm blankets for chills - Recommend switching from Boost to Glucerna for better blood sugar control  Mild Anemia Mild anemia, hemoglobin at 9.5 g/dL, similar to previous 9.8 g/dL. - Monitor hemoglobin levels  Diabetes Mellitus Elevated blood glucose at 245 mg/dL, likely due to Boost drink. Patient usually consumes oatmeal with less sugar. - Recommend switching from Boost to Glucerna for better blood sugar control  Plan -Lab reviewed, adequate for treatment, will proceed to chemotherapy tomorrow and continue every 2 weeks -Restaging CT scan in mid January -Follow-up in 2 weeks       SUMMARY OF ONCOLOGIC HISTORY: Oncology History Overview Note   Cancer Staging  Gallbladder cancer [facility]) Staging form: Distal Bile Duct, AJCC 8th Edition - Clinical stage from 04/09/2023: Stage IIIB (cT4, cN0, cM0) - Signed by Malachy Mood, MD on 04/18/2023 Total positive nodes: 0 Histologic grade (G): G2 Histologic grading system: 3 grade system     Gallbladder cancer (HCC)  04/04/2023 Imaging    IMPRESSION: 1. Ill-defined  hypodensity in the region of the gallbladder fossa appears similar to the prior study given lack of contrast. This would be better evaluated with dedicated  MRI. 2. Nonobstructing right renal calculus. 3. Large amount of stool throughout the colon. 4. Stable indeterminate lucent areas in the vertebral bodies of the lumbar spine. 5. Severe atherosclerotic disease.     04/09/2023 Pathology Results     FINAL MICROSCOPIC DIAGNOSIS:   A. LIVER, ADJACENT TO GALLBLADDER FOSSA, BIOPSY:  - Moderately differentiated adenocarcinoma    04/09/2023 Cancer Staging   Staging form: Distal Bile Duct, AJCC 8th Edition - Clinical stage from 04/09/2023: Stage IIIB (cT4, cN0, cM0) - Signed by Malachy Mood, MD on 04/18/2023 Total positive nodes: 0 Histologic grade (G): G2 Histologic grading system: 3 grade system   04/18/2023 Initial Diagnosis   Cholangiocarcinoma of liver (HCC)   05/03/2023 -  Chemotherapy   Patient is on Treatment Plan : BILIARY TRACT Cisplatin + Gemcitabine D1,15 + Durvalumab (1500) D1 q28d / Durvalumab (1500) q28d     07/08/2023 Imaging   CT Chest abdomen and pelvis with contrast  IMPRESSION: CT CHEST IMPRESSION  1.  No acute process or evidence of metastatic disease in the chest. 2. Left Port-A-Cath tip in azygous vein. This is being adjusted by interventional radiology later today. 3. Ongoing stability of tiny bilateral pulmonary nodules, favored to be benign. 4.  Aortic Atherosclerosis (ICD10-I70.0).  CT ABDOMEN AND PELVIS IMPRESSION  1. The segment 5 liver lesion is decreased in size compared to the MRI of 04/05/2023. 2. Placement of a biliary stent with improvement, near complete resolution of duct dilatation. 3. No abdominopelvic adenopathy. 4. Proximal gastric underdistention. Apparent wall thickening could be secondary. Correlate with symptoms of gastritis 5. Subtle findings within the superior spleen for which small volume interval splenic infarct cannot be excluded. Correlate with left upper quadrant symptoms. 6.  Possible constipation.        Discussed the use of AI scribe software for clinical note transcription with the  patient, who gave verbal consent to proceed.  History of Present Illness   The patient, an 82 year old female with gallbladder cancer, presents for a follow-up visit. She recently saw Dr. Gwenlyn Perking at [facility], who determined that surgery would not be beneficial for her condition. Despite this, the patient reports doing well with her current chemotherapy regimen. She denies any significant problems with her last cycle of chemotherapy, except for two instances of shaking and chills, which lasted about an hour each time. These episodes were not associated with fever and resolved on their own. The patient also reports that her tremors have improved, and she is able to continue her hobby of shooting pool. She expresses a preference to continue her current chemotherapy regimen, as she believes it is effectively fighting her tumor. She also mentions a potential future consideration of radiation therapy.         All other systems were reviewed with the patient and are negative.  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 There is a small calcified mass in the L:A near the LA appendage. No valvular masses seen with associated mitral annular calcification. LA Volume/ BSA27.4 ml/m2 No AS. Right ventricular systolic pressure is elevated at .   Jaundice    Myocardial infarction [facility])    June 2021   Neuromuscular disorder  (HCC)    neuropathy in bilateral feet   Peripheral vascular disease (HCC)    Pneumonia    not hosp.    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  PTCAs with drug coated balloon Lt ant tibial artery and Lt popliteal artery  11/26/2019    SURGICAL HISTORY: Past Surgical History:  Procedure Laterality Date   ABDOMINAL AORTAGRAM  11/25/2019   ABDOMINAL AORTOGRAM W/LOWER EXTREMITY    ABDOMINAL AORTOGRAM N/A 06/15/2019   Procedure: ABDOMINAL AORTOGRAM;  Surgeon: Runell Gess, MD;  [facility];  Service: Cardiovascular;  Laterality: N/A;   ABDOMINAL AORTOGRAM W/LOWER EXTREMITY N/A 11/25/2019   Procedure: ABDOMINAL AORTOGRAM W/LOWER EXTREMITY;  Surgeon: Iran Ouch, MD;  [facility]: [facility];  Service: Cardiovascular;  Laterality: N/A;  Lt leg   ABDOMINAL AORTOGRAM W/LOWER EXTREMITY N/A 08/01/2020   Procedure: ABDOMINAL AORTOGRAM W/LOWER EXTREMITY;  Surgeon: Runell Gess, MD;  [facility];  Service: Cardiovascular;  Laterality: N/A;   ABDOMINAL AORTOGRAM W/LOWER EXTREMITY N/A 04/28/2021   Procedure: ABDOMINAL AORTOGRAM W/LOWER EXTREMITY;  Surgeon: Sherren Kerns, MD;  [facility];  Service: Cardiovascular;  Laterality: N/A;   ABDOMINAL AORTOGRAM W/LOWER EXTREMITY N/A 10/13/2021   Procedure: ABDOMINAL AORTOGRAM W/LOWER EXTREMITY;  Surgeon: Leonie Douglas, MD;  [facility];  Service: Cardiovascular;  Laterality: N/A;   ABDOMINAL AORTOGRAM W/LOWER EXTREMITY N/A 11/09/2022   Procedure: ABDOMINAL AORTOGRAM W/LOWER EXTREMITY;  Surgeon: Leonie Douglas, MD;  [facility];  Service: Cardiovascular;  Laterality: N/A;   ABDOMINAL AORTOGRAM W/LOWER EXTREMITY N/A 11/16/2022   Procedure: ABDOMINAL AORTOGRAM W/LOWER EXTREMITY;  Surgeon: Leonie Douglas, MD;  [facility];  Service: Cardiovascular;  Laterality: N/A;   AMPUTATION TOE Left 05/03/2021   Procedure: AMPUTATION OF  THIRD LEFT  TOE;  Surgeon: Leonie Douglas, MD;  Location: [facility] OR;  Service: Vascular;  Laterality: Left;   APPENDECTOMY     APPLICATION OF WOUND VAC Right 07/21/2019   Procedure: Application Of Prevena Wound Vac Right Groin;  Surgeon: Nada Libman, MD;  Location: [facility] OR;  Service: Vascular;  Laterality: Right;   BILIARY BRUSHING  04/07/2023   Procedure: BILIARY BRUSHING;  Surgeon: Vida Rigger, MD;  Location: [facility] ENDOSCOPY;  Service: Gastroenterology;;   BILIARY STENT PLACEMENT  04/07/2023   Procedure: BILIARY STENT PLACEMENT;  Surgeon: Vida Rigger, MD;  Location: [facility] ENDOSCOPY;  Service: Gastroenterology;;   CARPAL TUNNEL RELEASE Left    CARPAL TUNNEL RELEASE Right    CESAREAN SECTION     x 2   CHOLECYSTECTOMY N/A 10/12/2022   Procedure: LAPAROSCOPIC PARTIAL FENESTRATING CHOLECYSTECTOMY;  Surgeon: Fritzi Mandes, MD;  [facility];  Service: General;  Laterality: N/A;   COLONOSCOPY     CORONARY ARTERY BYPASS GRAFT N/A 03/24/2020   Procedure: CORONARY ARTERY BYPASS GRAFTING (CABG) using LIMA to LAD (m); RIMA to RAMUS; Endoscopic Right Greater Saphenous Vein:  SVG to Diag1; SVG to PLB (right); and SVG to PL (left).;  Surgeon: Linden Dolin, MD;  Location: [facility] OR;  Service: Open Heart Surgery;  Laterality: N/A;  BILATERAL IMA   ENDARTERECTOMY FEMORAL Right 07/21/2019   Procedure: RIGHT ENDARTERECTOMY FEMORAL WITH PATCH ANGIOPLASTY;  Surgeon: Nada Libman, MD;  [facility];  Service: Vascular;  Laterality: Right;   ENDARTERECTOMY FEMORAL Right 08/16/2020   Procedure: RIGHT FEMORAL ENDARTERECTOMY;  Surgeon: Maeola Harman, MD;  Location: [facility] OR;  Service: Vascular;  Laterality: Right;   ENDOVEIN HARVEST OF GREATER SAPHENOUS VEIN Right 03/24/2020   Procedure: ENDOVEIN HARVEST OF GREATER SAPHENOUS VEIN;  Surgeon: Linden Dolin, MD;  [facility];  Service: Open Heart Surgery;  Laterality: Right;   ERCP N/A 04/07/2023   Procedure: ENDOSCOPIC RETROGRADE  CHOLANGIOPANCREATOGRAPHY (ERCP);  Surgeon: Vida Rigger, MD;  Location: [facility]COPY;  Service: Gastroenterology;  Laterality: N/A;   EYE SURGERY     cataract removal bilaterally   FEMORAL-POPLITEAL BYPASS GRAFT Right 08/16/2020   Procedure: BYPASS GRAFT FEMORAL-POPLITEAL ARTERY;  Surgeon: Maeola Harman, MD;  Location: [facility] OR;  Service: Vascular;  Laterality: Right;   FEMORAL-POPLITEAL BYPASS GRAFT Left 05/03/2021   Procedure: LEFT FEMORAL TO BELOW KNEE POPLITEAL ARTERY BYPASS GRAFTING WITH 6MMX80 PTFE GRAFT;  Surgeon: Leonie Douglas, MD;  [facility];  Service: Vascular;  Laterality: Left;   LEFT HEART CATH AND CORONARY ANGIOGRAPHY N/A 03/22/2020   Procedure: LEFT HEART CATH AND CORONARY ANGIOGRAPHY;  Surgeon: Swaziland, Peter M, MD;  Location: [facility] INVASIVE CV LAB;  Service: Cardiovascular;  Laterality: N/A;   LOWER EXTREMITY ANGIOGRAPHY Bilateral 06/15/2019   Procedure: Lower Extremity Angiography;  Surgeon: Runell Gess, MD;  Location: [facility] INVASIVE CV LAB;  Service: Cardiovascular;  Laterality: Bilateral;   LOWER EXTREMITY ANGIOGRAPHY Right 08/03/2019   Procedure: LOWER EXTREMITY ANGIOGRAPHY;  Surgeon: Runell Gess, MD;  [facility];  Service: Cardiovascular;  Laterality: Right;   LOWER EXTREMITY ANGIOGRAPHY N/A 09/07/2021   Procedure: LOWER EXTREMITY ANGIOGRAPHY;  Surgeon: Cephus Shelling, MD;  [facility];  Service: Cardiovascular;  Laterality: N/A;   PATCH ANGIOPLASTY Right 07/21/2019   Procedure: Patch Angioplasty Right Femoral Artery;  Surgeon: Nada Libman, MD;  Location: [facility] OR;  Service: Vascular;  Laterality: Right;   PERIPHERAL INTRAVASCULAR LITHOTRIPSY  11/25/2019   Procedure: INTRAVASCULAR LITHOTRIPSY;  Surgeon: Iran Ouch, MD;  [facility];  Service: Cardiovascular;;  LT. SFA   PERIPHERAL VASCULAR ATHERECTOMY  08/03/2019   Procedure: PERIPHERAL VASCULAR ATHERECTOMY;  Surgeon: Runell Gess,  MD;  Location: [facility] INVASIVE CV LAB;  Service: Cardiovascular;;  right SFA, right TP trunk   PERIPHERAL VASCULAR ATHERECTOMY  11/25/2019   Procedure: PERIPHERAL VASCULAR ATHERECTOMY;  Surgeon: Iran Ouch, MD;  [facility];  Service: Cardiovascular;;  Lt.  POPLITEAL and AT   PERIPHERAL VASCULAR BALLOON ANGIOPLASTY Left 06/15/2019   Procedure: PERIPHERAL VASCULAR BALLOON ANGIOPLASTY;  Surgeon: Runell Gess, MD;  [facility];  Service: Cardiovascular;  Laterality: Left;  SFA UNSUCCESSFUL UNABLE TO CROSS LESION   PERIPHERAL VASCULAR BALLOON ANGIOPLASTY  08/03/2019   Procedure: PERIPHERAL VASCULAR BALLOON ANGIOPLASTY;  Surgeon: Runell Gess, MD;  [facility];  Service: Cardiovascular;;  right SFA, Right TP trunk   PERIPHERAL VASCULAR BALLOON ANGIOPLASTY  09/07/2021   Procedure: PERIPHERAL VASCULAR BALLOON ANGIOPLASTY;  Surgeon: Cephus Shelling, MD;  [facility];  Service: Cardiovascular;;   PERIPHERAL VASCULAR BALLOON ANGIOPLASTY Right 10/13/2021   Procedure: PERIPHERAL VASCULAR BALLOON ANGIOPLASTY;  Surgeon: Leonie Douglas, MD;  [facility];  Service: Cardiovascular;  Laterality: Right;   PERIPHERAL VASCULAR BALLOON ANGIOPLASTY  11/09/2022   Procedure: PERIPHERAL VASCULAR BALLOON ANGIOPLASTY;  Surgeon: Leonie Douglas, MD;  [facility];  Service: Cardiovascular;;  fem-pop bypass and AT   PORTACATH PLACEMENT Left 04/29/2023   Procedure: INSERTION PORT-A-CATH;  Surgeon: Fritzi Mandes, MD;  Location: [facility] ORS;  Service: General;  Laterality: Left;   PORTACATH PLACEMENT N/A 07/12/2023   Procedure: REVISION PORT-A-CATH;  Surgeon: Fritzi Mandes, MD;  Location: [facility] OR;  Service: General;  Laterality: N/A;   REMOVAL OF STONES  04/07/2023   Procedure: REMOVAL OF STONES;  Surgeon: Vida Rigger, MD;  Location: [facility] ENDOSCOPY;  Service: Gastroenterology;;   Dennison Mascot  04/07/2023   Procedure:  Dennison Mascot;  Surgeon: Vida Rigger, MD;  Location: [facility] ENDOSCOPY;  Service: Gastroenterology;;   TEE WITHOUT CARDIOVERSION N/A 03/24/2020   Procedure: TRANSESOPHAGEAL ECHOCARDIOGRAM (TEE);  Surgeon: Linden Dolin, MD;  Location: [facility] OR;  Service: Open Heart Surgery;  Laterality: N/A;   TONSILLECTOMY     and adenoidectomy   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/2019   Procedure: TRANSMETATARSAL AMPUTATION;  Surgeon: Vivi Barrack, DPM;  [facility] OR;  Service: Podiatry;  Laterality: Right;   TRANSMETATARSAL AMPUTATION Left 05/11/2021   Procedure: LEFT TRANSMETATARSAL AMPUTATION;  Surgeon: Maeola Harman, MD;  Location: [facility] OR;  Service: Vascular;  Laterality: Left;   TUBAL LIGATION      I have reviewed the social history and family history with the patient and they are unchanged from previous note.  ALLERGIES:  is allergic to versed [midazolam], augmentin [amoxicillin-pot clavulanate], bactrim [sulfamethoxazole-trimethoprim], demerol [meperidine], meperidine hcl, scopolamine, and tramadol.  MEDICATIONS:  Current Outpatient Medications  Medication Sig Dispense Refill   aspirin EC 81 MG tablet Take 81 mg by mouth at bedtime. Swallow whole.     atorvastatin (LIPITOR) 80 MG tablet Take 1 tablet (80 mg total) by mouth daily. 90 tablet 3   brimonidine (ALPHAGAN) 0.2 % ophthalmic solution Place 1 drop into the right eye 2 (two) times daily.     empagliflozin (JARDIANCE) 25 MG TABS tablet Take 1 tablet (25 mg total) by mouth daily before breakfast. 90 tablet 3   hydrochlorothiazide (HYDRODIURIL) 12.5 MG tablet Take 1 tablet (12.5 mg total) by mouth daily. 90 tablet 0   Insulin Lispro Prot & Lispro (HUMALOG MIX 75/25 KWIKPEN) (75-25) 100 UNIT/ML Kwikpen INJECT 8 UNITS SUBCUTANEOUSLY ONCE DAILY WITH BREAKFAST 15 mL 4   Insulin Pen Needle (ULTRA-THIN II MINI PEN NEEDLE) 31G X 5 MM MISC Use as directed to check blood glucose 50 each 2   Lancets (ONETOUCH DELICA PLUS LANCET33G)  MISC Use to check blood sugars twice daily. (Patient taking differently: Use to check blood sugars  daily.) 100 each 5   lidocaine-prilocaine (EMLA) cream Apply to affected area once (Patient taking differently: Apply 1 Application topically as needed (port).) 30 g 3   MAGNESIUM-OXIDE 400 (240 Mg) MG tablet Take 1 tablet by mouth twice daily 60 tablet 0   metFORMIN (GLUCOPHAGE) 1000 MG tablet Take 1 tablet (1,000 mg total) by mouth 2 (two) times daily. 180 tablet 2   metoprolol tartrate (LOPRESSOR) 50 MG tablet Take 1 tablet by mouth twice daily 180 tablet 0   Multiple Vitamins-Minerals (MULTI FOR HER PO) Take 1 tablet by mouth daily.  mupirocin ointment (BACTROBAN) 2 % Apply 1 Application topically 2 (two) times daily. (Patient taking differently: Apply 1 Application topically 2 (two) times daily as needed (wound care for toes).) 30 g 2   ondansetron (ZOFRAN) 8 MG tablet Take 1 tablet (8 mg total) by mouth every 8 (eight) hours as needed for nausea or vomiting. Start on the third day after cisplatin. 30 tablet 1   ONETOUCH VERIO test strip USE TO CHECK BLOOD SUGARS ONCE DAILY 100 each 2   pantoprazole (PROTONIX) 40 MG tablet Take 1 tablet by mouth once daily 90 tablet 3   prochlorperazine (COMPAZINE) 10 MG tablet Take 1 tablet (10 mg total) by mouth every 6 (six) hours as needed (Nausea or vomiting). 30 tablet 1   Propylene Glycol (SYSTANE BALANCE) 0.6 % SOLN Place 1 drop into the left eye 3 (three) times daily as needed (dry/irritated eyes).     repaglinide (PRANDIN) 2 MG tablet Take 1 tab by breakfast and 1 tab before dinner 180 tablet 2   rivaroxaban (XARELTO) 2.5 MG TABS tablet Take 1 tablet by mouth twice daily 60 tablet 11   telmisartan (MICARDIS) 80 MG tablet Take 1 tablet by mouth once daily 90 tablet 0   No current facility-administered medications for this visit.    PHYSICAL EXAMINATION: ECOG PERFORMANCE STATUS: 1 - Symptomatic but completely ambulatory  Vitals:   08/29/23 1158   BP: (!) 129/55  Pulse: 90  Resp: 12  Temp: 98.1 F (36.7 C)  SpO2: 100%   Wt Readings from Last 3 Encounters:  08/29/23 135 lb 4.8 oz (61.4 kg)  08/15/23 135 lb (61.2 kg)  08/01/23 133 lb 11.2 oz (60.6 kg)     GENERAL:alert, no distress and comfortable SKIN: skin color, texture, turgor are normal, no rashes or significant lesions EYES: normal, Conjunctiva are pink and non-injected, sclera clear NECK: supple, thyroid normal size, non-tender, without nodularity LYMPH:  no palpable lymphadenopathy in the cervical, axillary  LUNGS: clear to auscultation and percussion with normal breathing effort HEART: regular rate & rhythm and no murmurs and no lower extremity edema ABDOMEN:abdomen soft, non-tender and normal bowel sounds Musculoskeletal:no cyanosis of digits and no clubbing  NEURO: alert & oriented x 3 with fluent speech, no focal motor/sensory deficits    LABORATORY DATA:  I have reviewed the data as listed    Latest Ref Rng & Units 08/29/2023   11:10 AM 08/15/2023    8:58 AM 08/01/2023   10:26 AM  CBC  WBC 4.0 - 10.5 K/uL 10.3  9.5  10.2   Hemoglobin 12.0 - 15.0 g/dL 9.5  9.8  9.7   Hematocrit 36.0 - 46.0 % 29.8  30.5  29.5   Platelets 150 - 400 K/uL 175  188  247         Latest Ref Rng & Units 08/29/2023   11:10 AM 08/15/2023    8:58 AM 08/01/2023   10:26 AM  CMP  Glucose 70 - 99 mg/dL 086  578  94   BUN 8 - 23 mg/dL 29  27  21    Creatinine 0.44 - 1.00 mg/dL 4.69  6.29  5.28   Sodium 135 - 145 mmol/L 138  138  139   Potassium 3.5 - 5.1 mmol/L 4.4  4.0  4.1   Chloride 98 - 111 mmol/L 103  104  103   CO2 22 - 32 mmol/L 28  27  29    Calcium 8.9 - 10.3 mg/dL 9.5  9.9  9.8  Total Protein 6.5 - 8.1 g/dL 6.5  6.7  6.9   Total Bilirubin <1.2 mg/dL 0.3  0.3  0.4   Alkaline Phos 38 - 126 U/L 120  126  138   AST 15 - 41 U/L 23  23  32   ALT 0 - 44 U/L 15  15  25        RADIOGRAPHIC STUDIES: I have personally reviewed the radiological images as listed and agreed  with the findings in the report. No results found.    No orders of the defined types were placed in this encounter.  All questions were answered. The patient knows to call the clinic with any problems, questions or concerns. No barriers to learning was detected. The total time spent in the appointment was 25 minutes.     Malachy Mood, MD 08/29/2023

## 2023-08-29 NOTE — Assessment & Plan Note (Signed)
 cT4N0M0, stage III -Patient had 1 episode of abdominal pain in October 2023, and was found to have a large gallbladder stone. -She underwent laparoscopic attempted cholecystectomy but was not able to be resected due to adhesions to duodenum and colon. -She developed jaundice in June 2024, status post ERCP and stent placement in CBD.  CT and MRI scan showed a liver mass adjacent to the gallbladder fossae, and a biopsy confirmed adenocarcinoma.  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  Unfortunately this was felt to be not resectable by Dr. Freida Busman. -I reviewed her CT chest from April 22, 2023, which showed no evidence of distant metastasis. -I recommended first-line cisplatin, gemcitabine  every 14 days and durvalumab every 28 days.  If she responded well, will stop chemo after 4 to 6 months of treatment, and changed to maintenance therapy. She started on 05/03/23 every 2 weeks and has been tolerating well.  We have also discussed option of consolidation radiation at some point. -restaging CT 07/08/2023 showed partial response in the primary site tumor with direct invasion to liver.  No new disease or question.  Will continue chemotherapy.

## 2023-08-30 ENCOUNTER — Other Ambulatory Visit: Payer: Self-pay

## 2023-08-30 ENCOUNTER — Inpatient Hospital Stay: Payer: Medicare Other

## 2023-08-30 VITALS — BP 127/47 | HR 94 | Temp 97.9°F | Resp 18 | Wt 135.8 lb

## 2023-08-30 DIAGNOSIS — C23 Malignant neoplasm of gallbladder: Secondary | ICD-10-CM

## 2023-08-30 DIAGNOSIS — Z5112 Encounter for antineoplastic immunotherapy: Secondary | ICD-10-CM | POA: Diagnosis not present

## 2023-08-30 DIAGNOSIS — N200 Calculus of kidney: Secondary | ICD-10-CM | POA: Diagnosis not present

## 2023-08-30 DIAGNOSIS — Z5111 Encounter for antineoplastic chemotherapy: Secondary | ICD-10-CM | POA: Diagnosis not present

## 2023-08-30 DIAGNOSIS — D649 Anemia, unspecified: Secondary | ICD-10-CM | POA: Diagnosis not present

## 2023-08-30 DIAGNOSIS — E1165 Type 2 diabetes mellitus with hyperglycemia: Secondary | ICD-10-CM | POA: Diagnosis not present

## 2023-08-30 LAB — CANCER ANTIGEN 19-9: CA 19-9: 1500 U/mL — ABNORMAL HIGH (ref 0–35)

## 2023-08-30 MED ORDER — MAGNESIUM SULFATE 4 GM/100ML IV SOLN
4.0000 g | Freq: Once | INTRAVENOUS | Status: AC
  Administered 2023-08-30: 4 g via INTRAVENOUS
  Filled 2023-08-30: qty 100

## 2023-08-30 MED ORDER — SODIUM CHLORIDE 0.9 % IV SOLN
1500.0000 mg | Freq: Once | INTRAVENOUS | Status: AC
  Administered 2023-08-30: 1500 mg via INTRAVENOUS
  Filled 2023-08-30: qty 30

## 2023-08-30 MED ORDER — POTASSIUM CHLORIDE IN NACL 20-0.9 MEQ/L-% IV SOLN
Freq: Once | INTRAVENOUS | Status: AC
  Filled 2023-08-30: qty 1000

## 2023-08-30 MED ORDER — PALONOSETRON HCL INJECTION 0.25 MG/5ML
0.2500 mg | Freq: Once | INTRAVENOUS | Status: AC
  Administered 2023-08-30: 0.25 mg via INTRAVENOUS
  Filled 2023-08-30: qty 5

## 2023-08-30 MED ORDER — SODIUM CHLORIDE 0.9 % IV SOLN
150.0000 mg | Freq: Once | INTRAVENOUS | Status: AC
  Administered 2023-08-30: 150 mg via INTRAVENOUS
  Filled 2023-08-30: qty 150

## 2023-08-30 MED ORDER — SODIUM CHLORIDE 0.9 % IV SOLN: Freq: Once | INTRAVENOUS | Status: AC

## 2023-08-30 MED ORDER — SODIUM CHLORIDE 0.9 % IV SOLN: Freq: Once | INTRAVENOUS | Status: DC

## 2023-08-30 MED ORDER — HEPARIN SOD (PORK) LOCK FLUSH 100 UNIT/ML IV SOLN
500.0000 [IU] | Freq: Once | INTRAVENOUS | Status: AC | PRN
  Administered 2023-08-30: 500 [IU]

## 2023-08-30 MED ORDER — SODIUM CHLORIDE 0.9% FLUSH
10.0000 mL | INTRAVENOUS | Status: DC | PRN
Start: 2023-08-30 — End: 2023-08-30
  Administered 2023-08-30: 10 mL

## 2023-08-30 MED ORDER — SODIUM CHLORIDE 0.9 % IV SOLN
800.0000 mg/m2 | Freq: Once | INTRAVENOUS | Status: AC
  Administered 2023-08-30: 1369 mg via INTRAVENOUS
  Filled 2023-08-30: qty 36.01

## 2023-08-30 MED ORDER — SODIUM CHLORIDE 0.9 % IV SOLN
20.0000 mg/m2 | Freq: Once | INTRAVENOUS | Status: AC
  Administered 2023-08-30: 34 mg via INTRAVENOUS
  Filled 2023-08-30: qty 34

## 2023-08-30 NOTE — Patient Instructions (Signed)
[facility]  Discharge Instructions: Thank you for choosing [facility] to provide your oncology and hematology care.   If you have a lab appointment with the Cancer Center, please go directly to the Cancer Center and check in at the registration area.   Wear comfortable clothing and clothing appropriate for easy access to any Portacath or PICC line.   We strive to give you quality time with your provider. You may need to reschedule your appointment if you arrive late (15 or more minutes).  Arriving late affects you and other patients whose appointments are after yours.  Also, if you miss three or more appointments without notifying the office, you may be dismissed from the clinic at the provider's discretion.      For prescription refill requests, have your pharmacy contact our office and allow 72 hours for refills to be completed.    Today you received the following chemotherapy and/or immunotherapy agents: Imfinzi, Cisplatin, Gemcitabine.      To help prevent nausea and vomiting after your treatment, we encourage you to take your nausea medication as directed.  BELOW ARE SYMPTOMS THAT SHOULD BE REPORTED IMMEDIATELY: *FEVER GREATER THAN 100.4 F (38 C) OR HIGHER *CHILLS OR SWEATING *NAUSEA AND VOMITING THAT IS NOT CONTROLLED WITH YOUR NAUSEA MEDICATION *UNUSUAL SHORTNESS OF BREATH *UNUSUAL BRUISING OR BLEEDING *URINARY PROBLEMS (pain or burning when urinating, or frequent urination) *BOWEL PROBLEMS (unusual diarrhea, constipation, pain near the anus) TENDERNESS IN MOUTH AND THROAT WITH OR WITHOUT PRESENCE OF ULCERS (sore throat, sores in mouth, or a toothache) UNUSUAL RASH, SWELLING OR PAIN  UNUSUAL VAGINAL DISCHARGE OR ITCHING   Items with * indicate a potential emergency and should be followed up as soon as possible or go to the Emergency Department if any problems should occur.  Please show the CHEMOTHERAPY ALERT  CARD or IMMUNOTHERAPY ALERT CARD at check-in to the Emergency Department and triage nurse.  Should you have questions after your visit or need to cancel or reschedule your appointment, please contact [facility]  Dept: [phone]  and follow the prompts.  Office hours are 8:00 a.m. to 4:30 p.m. Monday - Friday. Please note that voicemails left after 4:00 p.m. may not be returned until the following business day.  We are closed weekends and major holidays. You have access to a nurse at all times for urgent questions. Please call the main number to the clinic Dept: [phone] and follow the prompts.   For any non-urgent questions, you may also contact your provider using MyChart. We now offer e-Visits for anyone 8 and older to request care online for non-urgent symptoms. For details visit [email or web address].   Also download the MyChart app! Go to the app store, search "MyChart", open the app, select [place], and log in with your MyChart username and password.

## 2023-09-04 DIAGNOSIS — Z0000 Encounter for general adult medical examination without abnormal findings: Secondary | ICD-10-CM | POA: Diagnosis not present

## 2023-09-04 DIAGNOSIS — Z89431 Acquired absence of right foot: Secondary | ICD-10-CM | POA: Diagnosis not present

## 2023-09-04 DIAGNOSIS — I739 Peripheral vascular disease, unspecified: Secondary | ICD-10-CM | POA: Diagnosis not present

## 2023-09-04 DIAGNOSIS — E7800 Pure hypercholesterolemia, unspecified: Secondary | ICD-10-CM | POA: Diagnosis not present

## 2023-09-04 DIAGNOSIS — D849 Immunodeficiency, unspecified: Secondary | ICD-10-CM | POA: Diagnosis not present

## 2023-09-04 DIAGNOSIS — E1165 Type 2 diabetes mellitus with hyperglycemia: Secondary | ICD-10-CM | POA: Diagnosis not present

## 2023-09-04 DIAGNOSIS — C23 Malignant neoplasm of gallbladder: Secondary | ICD-10-CM | POA: Diagnosis not present

## 2023-09-04 DIAGNOSIS — Z23 Encounter for immunization: Secondary | ICD-10-CM | POA: Diagnosis not present

## 2023-09-12 MED FILL — Fosaprepitant Dimeglumine For IV Infusion 150 MG (Base Eq): INTRAVENOUS | Qty: 5 | Status: AC

## 2023-09-13 ENCOUNTER — Encounter: Payer: Self-pay | Admitting: Hematology

## 2023-09-13 ENCOUNTER — Inpatient Hospital Stay: Payer: Medicare Other

## 2023-09-13 ENCOUNTER — Inpatient Hospital Stay (HOSPITAL_BASED_OUTPATIENT_CLINIC_OR_DEPARTMENT_OTHER): Payer: Medicare Other | Admitting: Nurse Practitioner

## 2023-09-13 ENCOUNTER — Encounter: Payer: Self-pay | Admitting: Nurse Practitioner

## 2023-09-13 VITALS — BP 125/57 | HR 80 | Temp 98.2°F | Resp 16 | Ht 65.5 in | Wt 137.5 lb

## 2023-09-13 DIAGNOSIS — C23 Malignant neoplasm of gallbladder: Secondary | ICD-10-CM

## 2023-09-13 DIAGNOSIS — Z5112 Encounter for antineoplastic immunotherapy: Secondary | ICD-10-CM | POA: Diagnosis not present

## 2023-09-13 DIAGNOSIS — N200 Calculus of kidney: Secondary | ICD-10-CM | POA: Diagnosis not present

## 2023-09-13 DIAGNOSIS — Z95828 Presence of other vascular implants and grafts: Secondary | ICD-10-CM

## 2023-09-13 DIAGNOSIS — E1165 Type 2 diabetes mellitus with hyperglycemia: Secondary | ICD-10-CM | POA: Diagnosis not present

## 2023-09-13 DIAGNOSIS — Z5111 Encounter for antineoplastic chemotherapy: Secondary | ICD-10-CM | POA: Diagnosis not present

## 2023-09-13 DIAGNOSIS — D649 Anemia, unspecified: Secondary | ICD-10-CM | POA: Diagnosis not present

## 2023-09-13 LAB — CBC WITH DIFFERENTIAL (CANCER CENTER ONLY)
Abs Immature Granulocytes: 0.04 10*3/uL (ref 0.00–0.07)
Basophils Absolute: 0.1 10*3/uL (ref 0.0–0.1)
Basophils Relative: 1 %
Eosinophils Absolute: 0.1 10*3/uL (ref 0.0–0.5)
Eosinophils Relative: 1 %
HCT: 26.3 % — ABNORMAL LOW (ref 36.0–46.0)
Hemoglobin: 8.7 g/dL — ABNORMAL LOW (ref 12.0–15.0)
Immature Granulocytes: 0 %
Lymphocytes Relative: 14 %
Lymphs Abs: 1.4 10*3/uL (ref 0.7–4.0)
MCH: 33 pg (ref 26.0–34.0)
MCHC: 33.1 g/dL (ref 30.0–36.0)
MCV: 99.6 fL (ref 80.0–100.0)
Monocytes Absolute: 1.2 10*3/uL — ABNORMAL HIGH (ref 0.1–1.0)
Monocytes Relative: 13 %
Neutro Abs: 6.9 10*3/uL (ref 1.7–7.7)
Neutrophils Relative %: 71 %
Platelet Count: 165 10*3/uL (ref 150–400)
RBC: 2.64 MIL/uL — ABNORMAL LOW (ref 3.87–5.11)
RDW: 16.6 % — ABNORMAL HIGH (ref 11.5–15.5)
WBC Count: 9.7 10*3/uL (ref 4.0–10.5)
nRBC: 0 % (ref 0.0–0.2)

## 2023-09-13 LAB — TSH: TSH: 1.687 u[IU]/mL (ref 0.350–4.500)

## 2023-09-13 LAB — CMP (CANCER CENTER ONLY)
ALT: 14 U/L (ref 0–44)
AST: 27 U/L (ref 15–41)
Albumin: 3.6 g/dL (ref 3.5–5.0)
Alkaline Phosphatase: 100 U/L (ref 38–126)
Anion gap: 9 (ref 5–15)
BUN: 21 mg/dL (ref 8–23)
CO2: 27 mmol/L (ref 22–32)
Calcium: 9.2 mg/dL (ref 8.9–10.3)
Chloride: 103 mmol/L (ref 98–111)
Creatinine: 1.12 mg/dL — ABNORMAL HIGH (ref 0.44–1.00)
GFR, Estimated: 49 mL/min — ABNORMAL LOW (ref >60)
Glucose, Bld: 157 mg/dL — ABNORMAL HIGH (ref 70–99)
Potassium: 4.1 mmol/L (ref 3.5–5.1)
Sodium: 139 mmol/L (ref 135–145)
Total Bilirubin: 0.3 mg/dL (ref <1.2)
Total Protein: 6.1 g/dL — ABNORMAL LOW (ref 6.5–8.1)

## 2023-09-13 LAB — MAGNESIUM: Magnesium: 1.8 mg/dL (ref 1.7–2.4)

## 2023-09-13 MED ORDER — POTASSIUM CHLORIDE IN NACL 20-0.9 MEQ/L-% IV SOLN
Freq: Once | INTRAVENOUS | Status: AC
  Filled 2023-09-13: qty 1000

## 2023-09-13 MED ORDER — SODIUM CHLORIDE 0.9% FLUSH
10.0000 mL | INTRAVENOUS | Status: DC | PRN
  Administered 2023-09-13: 10 mL

## 2023-09-13 MED ORDER — MAGNESIUM SULFATE 4 GM/100ML IV SOLN
4.0000 g | Freq: Once | INTRAVENOUS | Status: AC
Start: 2023-09-13 — End: 2023-09-13
  Administered 2023-09-13: 4 g via INTRAVENOUS
  Filled 2023-09-13: qty 100

## 2023-09-13 MED ORDER — HEPARIN SOD (PORK) LOCK FLUSH 100 UNIT/ML IV SOLN
500.0000 [IU] | Freq: Once | INTRAVENOUS | Status: AC | PRN
Start: 2023-09-13 — End: 2023-09-13
  Administered 2023-09-13: 500 [IU]

## 2023-09-13 MED ORDER — SODIUM CHLORIDE 0.9% FLUSH
10.0000 mL | Freq: Once | INTRAVENOUS | Status: AC
  Administered 2023-09-13: 10 mL

## 2023-09-13 MED ORDER — SODIUM CHLORIDE 0.9 % IV SOLN
800.0000 mg/m2 | Freq: Once | INTRAVENOUS | Status: AC
  Administered 2023-09-13: 1369 mg via INTRAVENOUS
  Filled 2023-09-13: qty 36.01

## 2023-09-13 MED ORDER — PALONOSETRON HCL INJECTION 0.25 MG/5ML
0.2500 mg | Freq: Once | INTRAVENOUS | Status: AC
  Administered 2023-09-13: 0.25 mg via INTRAVENOUS
  Filled 2023-09-13: qty 5

## 2023-09-13 MED ORDER — FOSAPREPITANT DIMEGLUMINE INJECTION 150 MG
150.0000 mg | Freq: Once | INTRAVENOUS | Status: AC
  Administered 2023-09-13: 150 mg via INTRAVENOUS
  Filled 2023-09-13: qty 150

## 2023-09-13 MED ORDER — SODIUM CHLORIDE 0.9 % IV SOLN
20.0000 mg/m2 | Freq: Once | INTRAVENOUS | Status: AC
  Administered 2023-09-13: 34 mg via INTRAVENOUS
  Filled 2023-09-13: qty 34

## 2023-09-13 MED ORDER — SODIUM CHLORIDE 0.9 % IV SOLN: Freq: Once | INTRAVENOUS | Status: AC

## 2023-09-13 NOTE — Progress Notes (Signed)
Patient Care Team: Shon Hale, MD as PCP - General (Family Medicine) Runell Gess, MD as PCP - Cardiology (Cardiology) Malachy Mood, MD as Consulting Physician (Oncology)  Clinic Day:  09/13/2023  Referring physician: Malachy Mood, MD  ASSESSMENT & PLAN:   Assessment & Plan: Gallbladder cancer (HCC) cT4N0M0, stage III -Patient had 1 episode of abdominal pain in October 2023, and was found to have a large gallbladder stone. -She underwent laparoscopic attempted cholecystectomy but was not able to be resected due to adhesions to duodenum and colon. -She developed jaundice in June 2024, status post ERCP and stent placement in CBD.  CT and MRI scan showed a liver mass adjacent to the gallbladder fossae, and a biopsy confirmed adenocarcinoma.  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  Unfortunately this was felt to be not resectable by Dr. Freida Busman. -I reviewed her CT chest from April 22, 2023, which showed no evidence of distant metastasis. -I recommended first-line cisplatin, gemcitabine  every 14 days and durvalumab every 28 days.  If she responded well, will stop chemo after 4 to 6 months of treatment, and changed to maintenance therapy. She started on 05/03/23 every 2 weeks and has been tolerating well.  We have also discussed option of consolidation radiation at some point. -restaging CT 07/08/2023 showed partial response in the primary site tumor with direct invasion to liver.  No new disease or question.  Will continue chemotherapy.  Plan: Labs reviewed  -CBC showing WBC 9.7; Hgb 8.7; Hct 26.3; Plt 165; Anc 6.9 -CMP - K 4.1; glucose 157; BUN 21; Creatinine 1.12; eGFR 49; Ca 9.2; LFTs normal.   -Magnesium 1.8 -TSH and T4 pending Labs adequate to proceed with chemotherapy. -Today is cycle 5 day 15. Return for labs/flush, follow-up, and treatment in 2 weeks as scheduled.     The patient understands the plans discussed today and is in agreement with them.  She knows to contact our office if she develops concerns prior to her next appointment.  I provided 25 minutes of face-to-face time during this encounter and > 50% was spent counseling as documented under my assessment and plan.    Carlean Jews, NP  [facility] - [facility] [address] [place] [address] Dept: [phone] Dept Fax: [phone]   No orders of the defined types were placed in this encounter.     CHIEF COMPLAINT:  CC: Gallbladder cancer  Current Treatment: First-line chemotherapy cisplatin and gemcitabine every 2 weeks.  Durvalumab every 4 weeks.  INTERVAL HISTORY:  Bentley is here today for repeat clinical assessment.  Last saw Dr. Mosetta Putt on 08/29/2023.  Has been tolerating well in general.  Has had partial response to chemotherapy per restaging CT scan 06/2023.  Today is cycle 5 day 15.  She denies chest pain, chest pressure, or shortness of breath. She denies headaches or visual disturbances. She denies abdominal pain, nausea, vomiting, or changes in bowel or bladder habits.  States that she often goes back and forth between constipation and diarrhea, but more often constipated.  Take MiraLAX and Colace when needed.  This resolves the issue.  She denies fevers or chills. She denies pain. Her appetite is good. Her weight has increased 2 pounds over last 2 weeks .  I have reviewed the past medical history, past surgical history,  social history and family history with the patient and they are unchanged from previous note.  ALLERGIES:  is allergic to versed [midazolam], augmentin [amoxicillin-pot clavulanate], bactrim [sulfamethoxazole-trimethoprim], demerol [meperidine], meperidine hcl, scopolamine, and tramadol.  MEDICATIONS:  Current Outpatient Medications  Medication Sig Dispense Refill   aspirin EC 81  MG tablet Take 81 mg by mouth at bedtime. Swallow whole.     atorvastatin (LIPITOR) 80 MG tablet Take 1 tablet (80 mg total) by mouth daily. 90 tablet 3   brimonidine (ALPHAGAN) 0.2 % ophthalmic solution Place 1 drop into the right eye 2 (two) times daily.     empagliflozin (JARDIANCE) 25 MG TABS tablet Take 1 tablet (25 mg total) by mouth daily before breakfast. 90 tablet 3   hydrochlorothiazide (HYDRODIURIL) 12.5 MG tablet Take 1 tablet (12.5 mg total) by mouth daily. 90 tablet 0   Insulin Lispro Prot & Lispro (HUMALOG MIX 75/25 KWIKPEN) (75-25) 100 UNIT/ML Kwikpen INJECT 8 UNITS SUBCUTANEOUSLY ONCE DAILY WITH BREAKFAST 15 mL 4   Insulin Pen Needle (ULTRA-THIN II MINI PEN NEEDLE) 31G X 5 MM MISC Use as directed to check blood glucose 50 each 2   Lancets (ONETOUCH DELICA PLUS LANCET33G) MISC Use to check blood sugars twice daily. (Patient taking differently: Use to check blood sugars  daily.) 100 each 5   lidocaine-prilocaine (EMLA) cream Apply to affected area once (Patient taking differently: Apply 1 Application topically as needed (port).) 30 g 3   MAGNESIUM-OXIDE 400 (240 Mg) MG tablet Take 1 tablet by mouth twice daily 60 tablet 0   metFORMIN (GLUCOPHAGE) 1000 MG tablet Take 1 tablet (1,000 mg total) by mouth 2 (two) times daily. 180 tablet 2   metoprolol tartrate (LOPRESSOR) 50 MG tablet Take 1 tablet by mouth twice daily 180 tablet 0   Multiple Vitamins-Minerals (MULTI FOR HER PO) Take 1 tablet by mouth daily.     mupirocin ointment (BACTROBAN) 2 % Apply 1 Application topically 2 (two) times daily. (Patient taking differently: Apply 1 Application topically 2 (two) times daily as needed (wound care for toes).) 30 g 2   ondansetron (ZOFRAN) 8 MG tablet Take 1 tablet (8 mg total) by mouth every 8 (eight) hours as needed for nausea or vomiting. Start on the third day after cisplatin. 30 tablet 1   ONETOUCH VERIO test strip USE TO CHECK BLOOD SUGARS ONCE DAILY 100 each 2   pantoprazole (PROTONIX)  40 MG tablet Take 1 tablet by mouth once daily 90 tablet 3   prochlorperazine (COMPAZINE) 10 MG tablet Take 1 tablet (10 mg total) by mouth every 6 (six) hours as needed (Nausea or vomiting). 30 tablet 1   Propylene Glycol (SYSTANE BALANCE) 0.6 % SOLN Place 1 drop into the left eye 3 (three) times daily as needed (dry/irritated eyes).     repaglinide (PRANDIN) 2 MG tablet Take 1 tab by breakfast and 1 tab before dinner 180 tablet 2   rivaroxaban (XARELTO) 2.5 MG TABS tablet Take 1 tablet by mouth twice daily 60 tablet 11   telmisartan (MICARDIS) 80 MG tablet Take 1 tablet by mouth once daily 90 tablet 0   No current facility-administered medications for this visit.    HISTORY OF PRESENT ILLNESS:   Oncology History Overview Note   Cancer Staging  Gallbladder cancer [facility]) Staging form: Distal Bile Duct, AJCC 8th Edition - Clinical stage from 04/09/2023: Stage IIIB (cT4, cN0, cM0) - Signed by Malachy Mood, MD on 04/18/2023 Total positive nodes: 0 Histologic grade (G): G2 Histologic grading  system: 3 grade system     Gallbladder cancer (HCC)  04/04/2023 Imaging    IMPRESSION: 1. Ill-defined hypodensity in the region of the gallbladder fossa appears similar to the prior study given lack of contrast. This would be better evaluated with dedicated MRI. 2. Nonobstructing right renal calculus. 3. Large amount of stool throughout the colon. 4. Stable indeterminate lucent areas in the vertebral bodies of the lumbar spine. 5. Severe atherosclerotic disease.     04/09/2023 Pathology Results     FINAL MICROSCOPIC DIAGNOSIS:   A. LIVER, ADJACENT TO GALLBLADDER FOSSA, BIOPSY:  - Moderately differentiated adenocarcinoma    04/09/2023 Cancer Staging   Staging form: Distal Bile Duct, AJCC 8th Edition - Clinical stage from 04/09/2023: Stage IIIB (cT4, cN0, cM0) - Signed by Malachy Mood, MD on 04/18/2023 Total positive nodes: 0 Histologic grade (G): G2 Histologic grading system: 3 grade system    04/18/2023 Initial Diagnosis   Cholangiocarcinoma of liver (HCC)   05/03/2023 -  Chemotherapy   Patient is on Treatment Plan : BILIARY TRACT Cisplatin + Gemcitabine D1,15 + Durvalumab (1500) D1 q28d / Durvalumab (1500) q28d     07/08/2023 Imaging   CT Chest abdomen and pelvis with contrast  IMPRESSION: CT CHEST IMPRESSION  1.  No acute process or evidence of metastatic disease in the chest. 2. Left Port-A-Cath tip in azygous vein. This is being adjusted by interventional radiology later today. 3. Ongoing stability of tiny bilateral pulmonary nodules, favored to be benign. 4.  Aortic Atherosclerosis (ICD10-I70.0).  CT ABDOMEN AND PELVIS IMPRESSION  1. The segment 5 liver lesion is decreased in size compared to the MRI of 04/05/2023. 2. Placement of a biliary stent with improvement, near complete resolution of duct dilatation. 3. No abdominopelvic adenopathy. 4. Proximal gastric underdistention. Apparent wall thickening could be secondary. Correlate with symptoms of gastritis 5. Subtle findings within the superior spleen for which small volume interval splenic infarct cannot be excluded. Correlate with left upper quadrant symptoms. 6.  Possible constipation.         REVIEW OF SYSTEMS:   Constitutional: Denies fevers, chills or abnormal weight loss Eyes: Denies blurriness of vision Ears, nose, mouth, throat, and face: Denies mucositis or sore throat Respiratory: Denies cough, dyspnea or wheezes Cardiovascular: Denies palpitation, chest discomfort or lower extremity swelling Gastrointestinal:  Denies nausea, heartburn or change in bowel habits Skin: Denies abnormal skin rashes Lymphatics: Denies new lymphadenopathy or easy bruising Neurological:Denies numbness, tingling or new weaknesses Behavioral/Psych: Mood is stable, no new changes  All other systems were reviewed with the patient and are negative.   VITALS:   Today's Vitals   09/13/23 0810 09/13/23 0813 09/13/23 0818  BP:  (!) 145/54 (!) 125/57   Pulse: 80    Resp: 16    Temp: 98.2 F (36.8 C)    TempSrc: Temporal    SpO2: 100%    Weight: 137 lb 8 oz (62.4 kg)    Height: 5' 5.5" (1.664 m)    PainSc:   0-No pain   Body mass index is 22.53 kg/m.   Wt Readings from Last 3 Encounters:  09/13/23 137 lb 8 oz (62.4 kg)  08/30/23 135 lb 12.8 oz (61.6 kg)  08/29/23 135 lb 4.8 oz (61.4 kg)    Body mass index is 22.53 kg/m.  Performance status (ECOG): 1 - Symptomatic but completely ambulatory  PHYSICAL EXAM:   GENERAL:alert, no distress and comfortable SKIN: skin color, texture, turgor are normal, no rashes or significant lesions EYES: normal,  Conjunctiva are pink and non-injected, sclera clear OROPHARYNX:no exudate, no erythema and lips, buccal mucosa, and tongue normal  NECK: supple, thyroid normal size, non-tender, without nodularity LYMPH:  no palpable lymphadenopathy in the cervical, axillary or inguinal LUNGS: clear to auscultation and percussion with normal breathing effort HEART: regular rate & rhythm and no murmurs and no lower extremity edema ABDOMEN:abdomen soft, non-tender and normal bowel sounds Musculoskeletal:no cyanosis of digits and no clubbing  NEURO: alert & oriented x 3 with fluent speech, no focal motor/sensory deficits  LABORATORY DATA:  I have reviewed the data as listed    Component Value Date/Time   NA 139 09/13/2023 0756   NA 142 07/29/2020 0942   K 4.1 09/13/2023 0756   CL 103 09/13/2023 0756   CO2 27 09/13/2023 0756   GLUCOSE 157 (H) 09/13/2023 0756   BUN 21 09/13/2023 0756   BUN 17 07/29/2020 0942   CREATININE 1.12 (H) 09/13/2023 0756   CREATININE 0.96 10/12/2021 1644   CALCIUM 9.2 09/13/2023 0756   PROT 6.1 (L) 09/13/2023 0756   PROT 6.3 07/28/2020 0945   ALBUMIN 3.6 09/13/2023 0756   ALBUMIN 4.2 07/28/2020 0945   AST 27 09/13/2023 0756   ALT 14 09/13/2023 0756   ALKPHOS 100 09/13/2023 0756   BILITOT 0.3 09/13/2023 0756   GFRNONAA 49 (L) 09/13/2023 0756    GFRAA 75 07/29/2020 0942    Lab Results  Component Value Date   WBC 9.7 09/13/2023   NEUTROABS 6.9 09/13/2023   HGB 8.7 (L) 09/13/2023   HCT 26.3 (L) 09/13/2023   MCV 99.6 09/13/2023   PLT 165 09/13/2023

## 2023-09-13 NOTE — Progress Notes (Signed)
Per Vincent Gros, NP ok to run post hydration fluids concurrently with cisplatin

## 2023-09-13 NOTE — Assessment & Plan Note (Addendum)
cT4N0M0, stage III -Patient had 1 episode of abdominal pain in October 2023, and was found to have a large gallbladder stone. -She underwent laparoscopic attempted cholecystectomy but was not able to be resected due to adhesions to duodenum and colon. -She developed jaundice in June 2024, status post ERCP and stent placement in [place].  CT and MRI scan showed a liver mass adjacent to the gallbladder fossae, and a biopsy confirmed adenocarcinoma.  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  Unfortunately this was felt to be not resectable by Dr. Freida Busman. -I reviewed her CT chest from April 22, 2023, which showed no evidence of distant metastasis. -I recommended first-line cisplatin, gemcitabine  every 14 days and durvalumab every 28 days.  If she responded well, will stop chemo after 4 to 6 months of treatment, and changed to maintenance therapy. She started on 05/03/23 every 2 weeks and has been tolerating well.  We have also discussed option of consolidation radiation at some point. -restaging CT 07/08/2023 showed partial response in the primary site tumor with direct invasion to liver.  No new disease or question.  Will continue chemotherapy. -09/13/2023  - today is cycle 5 day 15.  Patient tolerating well.

## 2023-09-13 NOTE — Patient Instructions (Signed)
[facility]  Discharge Instructions: Thank you for choosing [facility] to provide your oncology and hematology care.   If you have a lab appointment with the Cancer Center, please go directly to the Cancer Center and check in at the registration area.   Wear comfortable clothing and clothing appropriate for easy access to any Portacath or PICC line.   We strive to give you quality time with your provider. You may need to reschedule your appointment if you arrive late (15 or more minutes).  Arriving late affects you and other patients whose appointments are after yours.  Also, if you miss three or more appointments without notifying the office, you may be dismissed from the clinic at the provider's discretion.      For prescription refill requests, have your pharmacy contact our office and allow 72 hours for refills to be completed.    Today you received the following chemotherapy and/or immunotherapy agents gemzar, cisplatin      To help prevent nausea and vomiting after your treatment, we encourage you to take your nausea medication as directed.  BELOW ARE SYMPTOMS THAT SHOULD BE REPORTED IMMEDIATELY: *FEVER GREATER THAN 100.4 F (38 C) OR HIGHER *CHILLS OR SWEATING *NAUSEA AND VOMITING THAT IS NOT CONTROLLED WITH YOUR NAUSEA MEDICATION *UNUSUAL SHORTNESS OF BREATH *UNUSUAL BRUISING OR BLEEDING *URINARY PROBLEMS (pain or burning when urinating, or frequent urination) *BOWEL PROBLEMS (unusual diarrhea, constipation, pain near the anus) TENDERNESS IN MOUTH AND THROAT WITH OR WITHOUT PRESENCE OF ULCERS (sore throat, sores in mouth, or a toothache) UNUSUAL RASH, SWELLING OR PAIN  UNUSUAL VAGINAL DISCHARGE OR ITCHING   Items with * indicate a potential emergency and should be followed up as soon as possible or go to the Emergency Department if any problems should occur.  Please show the CHEMOTHERAPY ALERT CARD or  IMMUNOTHERAPY ALERT CARD at check-in to the Emergency Department and triage nurse.  Should you have questions after your visit or need to cancel or reschedule your appointment, please contact [facility]  Dept: [phone]  and follow the prompts.  Office hours are 8:00 a.m. to 4:30 p.m. Monday - Friday. Please note that voicemails left after 4:00 p.m. may not be returned until the following business day.  We are closed weekends and major holidays. You have access to a nurse at all times for urgent questions. Please call the main number to the clinic Dept: [phone] and follow the prompts.   For any non-urgent questions, you may also contact your provider using MyChart. We now offer e-Visits for anyone 51 and older to request care online for non-urgent symptoms. For details visit [email or web address].   Also download the MyChart app! Go to the app store, search "MyChart", open the app, select [place], and log in with your MyChart username and password.

## 2023-09-14 LAB — T4: T4, Total: 13.7 ug/dL — ABNORMAL HIGH (ref 4.5–12.0)

## 2023-09-19 ENCOUNTER — Other Ambulatory Visit: Payer: Self-pay | Admitting: Hematology

## 2023-09-20 ENCOUNTER — Other Ambulatory Visit: Payer: Self-pay

## 2023-09-20 DIAGNOSIS — E1165 Type 2 diabetes mellitus with hyperglycemia: Secondary | ICD-10-CM

## 2023-09-23 ENCOUNTER — Other Ambulatory Visit: Payer: Medicare Other

## 2023-09-23 DIAGNOSIS — Z794 Long term (current) use of insulin: Secondary | ICD-10-CM | POA: Diagnosis not present

## 2023-09-23 DIAGNOSIS — E1165 Type 2 diabetes mellitus with hyperglycemia: Secondary | ICD-10-CM | POA: Diagnosis not present

## 2023-09-24 ENCOUNTER — Encounter: Payer: Self-pay | Admitting: Hematology

## 2023-09-24 ENCOUNTER — Other Ambulatory Visit: Payer: Self-pay

## 2023-09-24 DIAGNOSIS — E1165 Type 2 diabetes mellitus with hyperglycemia: Secondary | ICD-10-CM

## 2023-09-24 LAB — BASIC METABOLIC PANEL
BUN/Creatinine Ratio: 21 (calc) (ref 6–22)
BUN: 26 mg/dL — ABNORMAL HIGH (ref 7–25)
CO2: 29 mmol/L (ref 20–32)
Calcium: 9.4 mg/dL (ref 8.6–10.4)
Chloride: 101 mmol/L (ref 98–110)
Creat: 1.24 mg/dL — ABNORMAL HIGH (ref 0.60–0.95)
Glucose, Bld: 94 mg/dL (ref 65–99)
Potassium: 4.9 mmol/L (ref 3.5–5.3)
Sodium: 140 mmol/L (ref 135–146)

## 2023-09-24 LAB — HEMOGLOBIN A1C
Hgb A1c MFr Bld: 6.6 %{Hb} — ABNORMAL HIGH (ref <5.7)
Mean Plasma Glucose: 143 mg/dL
eAG (mmol/L): 7.9 mmol/L

## 2023-09-24 LAB — LIPID PANEL
Cholesterol: 117 mg/dL (ref <200)
HDL: 42 mg/dL — ABNORMAL LOW (ref >=50)
LDL Cholesterol (Calc): 53 mg/dL
Non-HDL Cholesterol (Calc): 75 mg/dL (ref <130)
Total CHOL/HDL Ratio: 2.8 (calc) (ref <5.0)
Triglycerides: 140 mg/dL (ref <150)

## 2023-09-24 LAB — MICROALBUMIN / CREATININE URINE RATIO
Creatinine, Urine: 42 mg/dL (ref 20–275)
Microalb Creat Ratio: 57 mg/g{creat} — ABNORMAL HIGH (ref <30)
Microalb, Ur: 2.4 mg/dL

## 2023-09-24 MED ORDER — REPAGLINIDE 2 MG PO TABS: ORAL_TABLET | ORAL | 2 refills | Status: DC

## 2023-09-26 ENCOUNTER — Encounter: Payer: Self-pay | Admitting: Endocrinology

## 2023-09-26 ENCOUNTER — Ambulatory Visit: Payer: Medicare Other | Admitting: Endocrinology

## 2023-09-26 DIAGNOSIS — E11649 Type 2 diabetes mellitus with hypoglycemia without coma: Secondary | ICD-10-CM | POA: Diagnosis not present

## 2023-09-26 DIAGNOSIS — Z7984 Long term (current) use of oral hypoglycemic drugs: Secondary | ICD-10-CM

## 2023-09-26 MED ORDER — REPAGLINIDE 1 MG PO TABS: 1.0000 mg | ORAL_TABLET | Freq: Two times a day (BID) | ORAL | 4 refills | Status: DC

## 2023-09-26 MED ORDER — GVOKE HYPOPEN 1-PACK 1 MG/0.2ML ~~LOC~~ SOAJ: 1.0000 mg | SUBCUTANEOUS | 2 refills | Status: DC | PRN

## 2023-09-26 MED ORDER — INSULIN LISPRO PROT & LISPRO (75-25 MIX) 100 UNIT/ML KWIKPEN: PEN_INJECTOR | SUBCUTANEOUS | 4 refills | Status: DC

## 2023-09-26 MED FILL — Fosaprepitant Dimeglumine For IV Infusion 150 MG (Base Eq): INTRAVENOUS | Qty: 5 | Status: AC

## 2023-09-26 NOTE — Progress Notes (Signed)
 Patient Care Team: Chrystal Lamarr RAMAN, MD as PCP - General (Family Medicine) Court Dorn PARAS, MD as PCP - Cardiology (Cardiology) Lanny Callander, MD as Consulting Physician (Oncology)  Clinic Day:  09/27/2023  Referring physician: Lanny Callander, MD  ASSESSMENT & PLAN:   Assessment & Plan: Gallbladder cancer (HCC) cT4N0M0, stage III -Patient had 1 episode of abdominal pain in October 2023, and was found to have a large gallbladder stone. -She underwent laparoscopic attempted cholecystectomy but was not able to be resected due to adhesions to duodenum and colon. -She developed jaundice in June 2024, status post ERCP and stent placement in [place].  CT and MRI scan showed a liver mass adjacent to the gallbladder fossae, and a biopsy confirmed adenocarcinoma.  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  Unfortunately this was felt to be not resectable by Dr. Dasie. -I reviewed her CT chest from April 22, 2023, which showed no evidence of distant metastasis. -I recommended first-line cisplatin , gemcitabine   every 14 days and durvalumab  every 28 days.  If she responded well, will stop chemo after 4 to 6 months of treatment, and changed to maintenance therapy. She started on 05/03/23 every 2 weeks and has been tolerating well.  We have also discussed option of consolidation radiation at some point. -restaging CT 07/08/2023 showed partial response in the primary site tumor with direct invasion to liver.  No new disease or question.  Will continue chemotherapy. -09/27/2023  - today is cycle 6 day 1.  Patient tolerating well. 09/27/2023 - restaging CT CAP today    Plan: Labs reviewed  -CBC showing WBC 8.2; Hgb 8.5; Hct 25.8; Plt 162; Anc 5.7 -CMP - K 4.1; glucose 156; BUN 25; Creatinine 1.14; eGFR 48; Ca 9.2; LFTs normal.   -Magnesium  - 1.9 Restaging CT CAP done this morning.  Awaiting results to review with  patient. Patient condition and labs are satisfactory for treatment today. Proceed with cycle 6 day 1 of cisplatin , gemcitabine , and durvalumab . Labs/flush, follow-up, and treatment in 2 weeks as scheduled.  The patient understands the plans discussed today and is in agreement with them.  She knows to contact our office if she develops concerns prior to her next appointment.  I provided 25 minutes of face-to-face time during this encounter and > 50% was spent counseling as documented under my assessment and plan.    Powell FORBES Lessen, NP  [facility] - A DEPT OF [facility] [address] [place] [address] Dept: [phone] Dept Fax: [phone]   No orders of the defined types were placed in this encounter.     CHIEF COMPLAINT:  CC: gallbladder cancer   Current Treatment:  cisplatin  and gemcitabine  with durvalumab  every 4 weeks   INTERVAL HISTORY:  [name] is here today for repeat clinical assessment. She last saw myself on 09/13/2023. She had CT CAP earlier today.  Reports feeling well.  Does have some constipation which is relieved by taking MiraLAX .  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  This is unchanged.  Not interfering with her normal activities of daily living.  She also reports some fatigue which is her baseline since starting treatment.  She denies chest pain, chest pressure, or shortness of breath. She denies headaches or visual disturbances. She does denies abdominal pain, nausea, vomiting, or changes in bowel or bladder habits.  She denies fevers or chills. She denies pain. Her appetite is good. Her weight has been stable.  I have reviewed the past medical history, past surgical history, social history and family history with the patient and they are unchanged from previous note.  ALLERGIES:  is allergic to versed  [midazolam ], augmentin [amoxicillin-pot clavulanate], bactrim   [sulfamethoxazole -trimethoprim ], demerol [meperidine], meperidine hcl, scopolamine, and tramadol .  MEDICATIONS:  Current Outpatient Medications  Medication Sig Dispense Refill   aspirin  EC 81 MG tablet Take 81 mg by mouth at bedtime. Swallow whole.     atorvastatin  (LIPITOR ) 80 MG tablet Take 1 tablet (80 mg total) by mouth daily. 90 tablet 3   brimonidine  (ALPHAGAN ) 0.2 % ophthalmic solution Place 1 drop into the right eye 2 (two) times daily.     empagliflozin  (JARDIANCE ) 25 MG TABS tablet Take 1 tablet (25 mg total) by mouth daily before breakfast. 90 tablet 3   Glucagon  (GVOKE HYPOPEN  1-PACK) 1 MG/0.2ML SOAJ Inject 1 mg into the skin as needed (low blood sugar with impaired consciousness). 0.4 mL 2   hydrochlorothiazide  (HYDRODIURIL ) 12.5 MG tablet Take 1 tablet (12.5 mg total) by mouth daily. 90 tablet 0   Insulin  Lispro Prot & Lispro (HUMALOG  MIX 75/25 KWIKPEN) (75-25) 100 UNIT/ML Kwikpen INJECT 6 UNITS SUBCUTANEOUSLY ONCE DAILY WITH BREAKFAST 15 mL 4   Insulin  Pen Needle (ULTRA-THIN II MINI PEN NEEDLE) 31G X 5 MM MISC Use as directed to check blood glucose 50 each 2   Lancets (ONETOUCH DELICA PLUS LANCET33G) MISC Use to check blood sugars twice daily. (Patient taking differently: Use to check blood sugars  daily.) 100 each 5   lidocaine -prilocaine  (EMLA ) cream Apply to affected area once (Patient taking differently: Apply 1 Application topically as needed (port).) 30 g 3   MAGNESIUM -OXIDE 400 (240 Mg) MG tablet Take 1 tablet by mouth twice daily 60 tablet 0   metFORMIN  (GLUCOPHAGE ) 1000 MG tablet Take 1 tablet (1,000 mg total) by mouth 2 (two) times daily. 180 tablet 2   metoprolol  tartrate (LOPRESSOR ) 50 MG tablet Take 1 tablet by mouth twice daily 180 tablet 0   Multiple Vitamins-Minerals (MULTI FOR HER PO) Take 1 tablet by mouth daily.     mupirocin  ointment (BACTROBAN ) 2 % Apply 1 Application topically 2 (two) times daily. (Patient taking differently: Apply 1 Application topically 2  (two) times daily as needed (wound care for toes).) 30 g 2   ondansetron  (ZOFRAN ) 8 MG tablet Take 1 tablet (8 mg total) by mouth every 8 (eight) hours as needed for nausea or vomiting. Start on the third day after cisplatin . 30 tablet 1   ONETOUCH VERIO test strip USE TO CHECK BLOOD SUGARS ONCE DAILY 100 each 2   pantoprazole  (PROTONIX ) 40 MG tablet Take 1 tablet by mouth once daily 90 tablet 3   prochlorperazine  (COMPAZINE ) 10 MG tablet Take 1 tablet (10 mg total) by mouth every 6 (six) hours as needed (Nausea or vomiting). 30 tablet 1   Propylene Glycol (SYSTANE BALANCE) 0.6 % SOLN Place 1 drop into the left eye 3 (three) times daily as needed (dry/irritated eyes).     repaglinide  (PRANDIN ) 1 MG tablet Take 1 tablet (1 mg total) by mouth 2 (two) times daily before a meal. Take 1 tab by breakfast and 1 tab before dinner 180 tablet 4   rivaroxaban  (XARELTO ) 2.5 MG TABS tablet Take 1 tablet by mouth twice daily 60 tablet 11   telmisartan  (MICARDIS ) 80 MG tablet Take 1 tablet by mouth once daily  90 tablet 0   No current facility-administered medications for this visit.   Facility-Administered Medications Ordered in Other Visits  Medication Dose Route Frequency Provider Last Rate Last Admin   CISplatin  (PLATINOL ) 34 mg in sodium chloride  0.9 % 250 mL chemo infusion  20 mg/m2 (Treatment Plan Recorded) Intravenous Once Lanny Callander, MD       durvalumab  (IMFINZI ) 1,500 mg in sodium chloride  0.9 % 100 mL chemo infusion  1,500 mg Intravenous Once Lanny Callander, MD       fosaprepitant  (EMEND) 150 mg in sodium chloride  0.9 % 145 mL IVPB  150 mg Intravenous Once Lanny Callander, MD       gemcitabine  (GEMZAR ) 1,369 mg in sodium chloride  0.9 % 250 mL chemo infusion  800 mg/m2 (Treatment Plan Recorded) Intravenous Once Lanny Callander, MD       heparin  lock flush 100 unit/mL  500 Units Intracatheter Once PRN Lanny Callander, MD       magnesium  sulfate IVPB 4 g 100 mL  4 g Intravenous Once Lanny Callander, MD 50 mL/hr at 09/27/23 0933 4 g at  09/27/23 0933   palonosetron  (ALOXI ) injection 0.25 mg  0.25 mg Intravenous Once Lanny Callander, MD       sodium chloride  flush (NS) 0.9 % injection 10 mL  10 mL Intracatheter PRN Lanny Callander, MD        HISTORY OF PRESENT ILLNESS:   Oncology History Overview Note   Cancer Staging  Gallbladder cancer [facility]) Staging form: Distal Bile Duct, AJCC 8th Edition - Clinical stage from 04/09/2023: Stage IIIB (cT4, cN0, cM0) - Signed by Lanny Callander, MD on 04/18/2023 Total positive nodes: 0 Histologic grade (G): G2 Histologic grading system: 3 grade system     Gallbladder cancer (HCC)  04/04/2023 Imaging    IMPRESSION: 1. Ill-defined hypodensity in the region of the gallbladder fossa appears similar to the prior study given lack of contrast. This would be better evaluated with dedicated MRI. 2. Nonobstructing right renal calculus. 3. Large amount of stool throughout the colon. 4. Stable indeterminate lucent areas in the vertebral bodies of the lumbar spine. 5. Severe atherosclerotic disease.     04/09/2023 Pathology Results     FINAL MICROSCOPIC DIAGNOSIS:   A. LIVER, ADJACENT TO GALLBLADDER FOSSA, BIOPSY:  - Moderately differentiated adenocarcinoma    04/09/2023 Cancer Staging   Staging form: Distal Bile Duct, AJCC 8th Edition - Clinical stage from 04/09/2023: Stage IIIB (cT4, cN0, cM0) - Signed by Lanny Callander, MD on 04/18/2023 Total positive nodes: 0 Histologic grade (G): G2 Histologic grading system: 3 grade system   04/18/2023 Initial Diagnosis   Cholangiocarcinoma of liver (HCC)   05/03/2023 -  Chemotherapy   Patient is on Treatment Plan : BILIARY TRACT Cisplatin  + Gemcitabine  D1,15 + Durvalumab  (1500) D1 q28d / Durvalumab  (1500) q28d     07/08/2023 Imaging   CT Chest abdomen and pelvis with contrast  IMPRESSION: CT CHEST IMPRESSION  1.  No acute process or evidence of metastatic disease in the chest. 2. Left Port-A-Cath tip in azygous vein. This is being adjusted by interventional  radiology later today. 3. Ongoing stability of tiny bilateral pulmonary nodules, favored to be benign. 4.  Aortic Atherosclerosis (ICD10-I70.0).  CT ABDOMEN AND PELVIS IMPRESSION  1. The segment 5 liver lesion is decreased in size compared to the MRI of 04/05/2023. 2. Placement of a biliary stent with improvement, near complete resolution of duct dilatation. 3. No abdominopelvic adenopathy. 4. Proximal gastric underdistention. Apparent wall thickening could be  secondary. Correlate with symptoms of gastritis 5. Subtle findings within the superior spleen for which small volume interval splenic infarct cannot be excluded. Correlate with left upper quadrant symptoms. 6.  Possible constipation.         REVIEW OF SYSTEMS:   Constitutional: Denies fevers, chills or abnormal weight loss. Fatigue at baseline since starting chemotherapy  Eyes: Denies blurriness of vision Ears, nose, mouth, throat, and face: Denies mucositis or sore throat Respiratory: Denies cough, dyspnea or wheezes Cardiovascular: Denies palpitation, chest discomfort or lower extremity swelling Gastrointestinal:  Denies nausea, heartburn or change in bowel habits Skin: Denies abnormal skin rashes Lymphatics: Denies new lymphadenopathy or easy bruising Neurological:Denies numbness, tingling or new weaknesses Behavioral/Psych: Mood is stable, no new changes  All other systems were reviewed with the patient and are negative.   VITALS:   Today's Vitals   09/27/23 0834 09/27/23 0838 09/27/23 0843  BP: (!) 164/67 (!) 160/57   Pulse: 96    Resp: 16    Temp: 98 F (36.7 C)    TempSrc: Temporal    SpO2: 100%    Weight: 141 lb 12.8 oz (64.3 kg)    Height: 5' 5.5 (1.664 m)    PainSc:   0-No pain   Body mass index is 23.24 kg/m.   Wt Readings from Last 3 Encounters:  09/27/23 141 lb 12.8 oz (64.3 kg)  09/26/23 141 lb 3.2 oz (64 kg)  09/13/23 137 lb 8 oz (62.4 kg)    Body mass index is 23.24 kg/m.  Performance  status (ECOG): 1 - Symptomatic but completely ambulatory  PHYSICAL EXAM:   GENERAL:alert, no distress and comfortable SKIN: skin color, texture, turgor are normal, no rashes or significant lesions EYES: normal, Conjunctiva are pink and non-injected, sclera clear OROPHARYNX:no exudate, no erythema and lips, buccal mucosa, and tongue normal  NECK: supple, thyroid  normal size, non-tender, without nodularity LYMPH:  no palpable lymphadenopathy in the cervical, axillary or inguinal LUNGS: clear to auscultation and percussion with normal breathing effort HEART: regular rate & rhythm and no murmurs and no lower extremity edema ABDOMEN:abdomen soft, non-tender and normal bowel sounds Musculoskeletal:no cyanosis of digits and no clubbing  NEURO: alert & oriented x 3 with fluent speech, no focal motor/sensory deficits  LABORATORY DATA:  I have reviewed the data as listed    Component Value Date/Time   NA 138 09/27/2023 0822   NA 142 07/29/2020 0942   K 4.1 09/27/2023 0822   CL 103 09/27/2023 0822   CO2 26 09/27/2023 0822   GLUCOSE 156 (H) 09/27/2023 0822   BUN 25 (H) 09/27/2023 0822   BUN 17 07/29/2020 0942   CREATININE 1.14 (H) 09/27/2023 0822   CREATININE 1.24 (H) 09/23/2023 0828   CALCIUM  9.2 09/27/2023 0822   PROT 6.4 (L) 09/27/2023 0822   PROT 6.3 07/28/2020 0945   ALBUMIN  3.6 09/27/2023 0822   ALBUMIN  4.2 07/28/2020 0945   AST 31 09/27/2023 0822   ALT 14 09/27/2023 0822   ALKPHOS 94 09/27/2023 0822   BILITOT 0.4 09/27/2023 0822   GFRNONAA 48 (L) 09/27/2023 0822   GFRAA 75 07/29/2020 0942    Lab Results  Component Value Date   WBC 8.2 09/27/2023   NEUTROABS 5.7 09/27/2023   HGB 8.5 (L) 09/27/2023   HCT 25.8 (L) 09/27/2023   MCV 99.6 09/27/2023   PLT 162 09/27/2023

## 2023-09-26 NOTE — Assessment & Plan Note (Addendum)
 cT4N0M0, stage III -Patient had 1 episode of abdominal pain in October 2023, and was found to have a large gallbladder stone. -She underwent laparoscopic attempted cholecystectomy but was not able to be resected due to adhesions to duodenum and colon. -She developed jaundice in June 2024, status post ERCP and stent placement in [place].  CT and MRI scan showed a liver mass adjacent to the gallbladder fossae, and a biopsy confirmed adenocarcinoma.  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  Unfortunately this was felt to be not resectable by Dr. Dasie. -I reviewed her CT chest from April 22, 2023, which showed no evidence of distant metastasis. -I recommended first-line cisplatin , gemcitabine   every 14 days and durvalumab  every 28 days.  If she responded well, will stop chemo after 4 to 6 months of treatment, and changed to maintenance therapy. She started on 05/03/23 every 2 weeks and has been tolerating well.  We have also discussed option of consolidation radiation at some point. -restaging CT 07/08/2023 showed partial response in the primary site tumor with direct invasion to liver.  No new disease or question.  Will continue chemotherapy. -09/27/2023  - today is cycle 6 day 1.  Patient tolerating well. 09/27/2023 - restaging CT CAP today

## 2023-09-26 NOTE — Patient Instructions (Signed)
 Decrease Humalog mix to 6 units with breakfast. Decrease Prandin from 2 mg to 1 mg with breakfast and supper. Continue same dose of metformin and Jardiance.

## 2023-09-26 NOTE — Progress Notes (Signed)
 Outpatient Endocrinology Note Capri Raben, MD  09/26/23  Patient's Name: [name]    DOB: 08/10/1941    MRN: [id number]                                                    REASON OF VISIT: Follow-up of type 2 diabetes mellitus  PCP: Chrystal Lamarr RAMAN, MD  HISTORY OF PRESENT ILLNESS:   [name] is a 83 y.o. old female with past medical history listed below, is here for follow up of type 2 diabetes mellitus.   Pertinent Diabetes History: Patient was diagnosed with type 2 diabetes mellitus in 1984.  Patient was initially treated with metformin  and she had been on various diabetic medication over the years.  Chronic Diabetes Complications : Retinopathy: yes. Following with ophthalmology.  Nephropathy: microalbuminuria / CKD , on telmisartan  Peripheral neuropathy: yes, PAD, following with vascular cardiologist.  Coronary artery disease: yes, s/p CABG in 2021.  Stroke: no  Relevant comorbidities and cardiovascular risk factors: Obesity: no Body mass index is 23.14 kg/m.  Hypertension: yes Hyperlipidemia : yes, on statin  Current / Home Diabetic regimen includes: Humalog  mix 75/25: 7 units with breakfast Metformin  1000 mg BID Jardiance  25 mg daily. Prandin  2mg  BID  Prior diabetic medications:  Glycemic data:   She has been checking blood sugar infrequently in a visit 0.4 times a day.  She is taking mostly in the morning fasting and she has frequent hypoglycemia with blood sugar 64, 67, 66.  Other times she has blood sugar 70 6 in the morning and afternoon blood sugar 1 1.  Blood sugar download from glucometer reviewed for last 2 weeks from December 19 to September 25, 2022. She has been using One Touch Verio flex glucometer.  Hypoglycemia: Patient has minor hypoglycemic episodes. Patient has hypoglycemia awareness.  Factors modifying glucose control: 1.  Diabetic diet assessment: 3 meals a day.  Tried to limit sweets and fried food.  2.  Staying active or  exercising: tried to exercise in bike at home.  3.  Medication compliance: compliant all of the time.  Interval history  Glucometer data as reviewed above.  Hemoglobin A1c recently 6.6% likely falsely low.  She has frequent hypoglycemia in the morning fasting.  She decreased her Humalog  mix to 7 units from 8 units.  She reports she has not been eating much lately.  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  No other complaints today.  Recent lab results reviewed.  REVIEW OF SYSTEMS As per history of present illness.   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 There is a small calcified mass in  the L:A near the LA appendage. No valvular masses seen with associated mitral annular calcification. LA Volume/ BSA27.4 ml/m2 No AS. Right ventricular systolic pressure is elevated at .   Jaundice    Myocardial infarction [facility])    June 2021   Neuromuscular disorder (HCC)    neuropathy in bilateral feet   Peripheral vascular disease (HCC)    Pneumonia    not hosp.    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  PTCAs with drug coated balloon Lt ant tibial artery and Lt popliteal artery  11/26/2019    PAST SURGICAL HISTORY: Past Surgical History:  Procedure Laterality Date   ABDOMINAL AORTAGRAM  11/25/2019   ABDOMINAL AORTOGRAM W/LOWER EXTREMITY    ABDOMINAL AORTOGRAM N/A 06/15/2019    Procedure: ABDOMINAL AORTOGRAM;  Surgeon: Court Dorn PARAS, MD;  [facility];  Service: Cardiovascular;  Laterality: N/A;   ABDOMINAL AORTOGRAM W/LOWER EXTREMITY N/A 11/25/2019   Procedure: ABDOMINAL AORTOGRAM W/LOWER EXTREMITY;  Surgeon: Darron Deatrice LABOR, MD;  [facility];  Service: Cardiovascular;  Laterality: N/A;  Lt leg   ABDOMINAL AORTOGRAM W/LOWER EXTREMITY N/A 08/01/2020   Procedure: ABDOMINAL AORTOGRAM W/LOWER EXTREMITY;  Surgeon: Court Dorn PARAS, MD;  [facility];  Service: Cardiovascular;  Laterality: N/A;   ABDOMINAL AORTOGRAM W/LOWER EXTREMITY N/A 04/28/2021   Procedure: ABDOMINAL AORTOGRAM W/LOWER EXTREMITY;  Surgeon: Harvey Carlin BRAVO, MD;  [facility];  Service: Cardiovascular;  Laterality: N/A;   ABDOMINAL AORTOGRAM W/LOWER EXTREMITY N/A 10/13/2021   Procedure: ABDOMINAL AORTOGRAM W/LOWER EXTREMITY;  Surgeon: Magda Debby SAILOR, MD;  [facility];  Service: Cardiovascular;  Laterality: N/A;   ABDOMINAL AORTOGRAM W/LOWER EXTREMITY N/A 11/09/2022   Procedure: ABDOMINAL AORTOGRAM W/LOWER EXTREMITY;  Surgeon: Magda Debby SAILOR, MD;  [facility];  Service: Cardiovascular;  Laterality: N/A;   ABDOMINAL AORTOGRAM W/LOWER EXTREMITY N/A 11/16/2022   Procedure: ABDOMINAL AORTOGRAM W/LOWER EXTREMITY;  Surgeon: Magda Debby SAILOR, MD;  [facility];  Service: Cardiovascular;  Laterality: N/A;   AMPUTATION TOE Left 05/03/2021   Procedure: AMPUTATION OF THIRD LEFT  TOE;  Surgeon: Magda Debby SAILOR, MD;  Location: [facility] OR;  Service: Vascular;  Laterality: Left;   APPENDECTOMY     APPLICATION OF WOUND VAC Right 07/21/2019   Procedure: Application Of Prevena Wound Vac Right Groin;  Surgeon: Serene Gaile ORN, MD;  Location: [facility] OR;  Service: Vascular;  Laterality: Right;   BILIARY BRUSHING  04/07/2023   Procedure: BILIARY BRUSHING;  Surgeon: Rosalie Kitchens, MD;  Location: [facility] ENDOSCOPY;   Service: Gastroenterology;;   BILIARY STENT PLACEMENT  04/07/2023   Procedure: BILIARY STENT PLACEMENT;  Surgeon: Rosalie Kitchens, MD;  Location: [facility] END[facility]COPY;  Service: Gastroenterology;;   CARPAL TUNNEL RELEASE Left    CARPAL TUNNEL RELEASE Right    CESAREAN SECTION     x 2   CHOLECYSTECTOMY N/A 10/12/2022   Procedure: LAPAROSCOPIC PARTIAL FENESTRATING CHOLECYSTECTOMY;  Surgeon: Dasie Leonor CROME, MD;  [facility];  Service: General;  Laterality: N/A;   COLONOSCOPY     CORONARY ARTERY BYPASS GRAFT N/A 03/24/2020   Procedure: CORONARY ARTERY BYPASS GRAFTING (CABG) using LIMA to LAD (m); RIMA to RAMUS; Endoscopic Right Greater Saphenous Vein: SVG to Diag1; SVG to PLB (right); and SVG to PL (left).;  Surgeon: German Bartlett PEDLAR, MD;  [facility];  Service: Open Heart Surgery;  Laterality: N/A;  BILATERAL IMA   ENDARTERECTOMY FEMORAL Right 07/21/2019  Procedure: RIGHT ENDARTERECTOMY FEMORAL WITH PATCH ANGIOPLASTY;  Surgeon: Serene Gaile ORN, MD;  Location: [facility] OR;  Service: Vascular;  Laterality: Right;   ENDARTERECTOMY FEMORAL Right 08/16/2020   Procedure: RIGHT FEMORAL ENDARTERECTOMY;  Surgeon: Sheree Penne Bruckner, MD;  Location: [facility] OR;  Service: Vascular;  Laterality: Right;   ENDOVEIN HARVEST OF GREATER SAPHENOUS VEIN Right 03/24/2020   Procedure: ENDOVEIN HARVEST OF GREATER SAPHENOUS VEIN;  Surgeon: German Bartlett PEDLAR, MD;  [facility];  Service: Open Heart Surgery;  Laterality: Right;   ERCP N/A 04/07/2023   Procedure: ENDOSCOPIC RETROGRADE CHOLANGIOPANCREATOGRAPHY (ERCP);  Surgeon: Rosalie Kitchens, MD;  Location: [facility] ENDOSCOPY;  Service: Gastroenterology;  Laterality: N/A;   EYE SURGERY     cataract removal bilaterally   FEMORAL-POPLITEAL BYPASS GRAFT Right 08/16/2020   Procedure: BYPASS GRAFT FEMORAL-POPLITEAL ARTERY;  Surgeon: Sheree Penne Bruckner, MD;  Location: [facility] OR;  Service: Vascular;  Laterality: Right;   FEMORAL-POPLITEAL BYPASS GRAFT Left 05/03/2021   Procedure: LEFT  FEMORAL TO BELOW KNEE POPLITEAL ARTERY BYPASS GRAFTING WITH 6MMX80 PTFE GRAFT;  Surgeon: Magda Debby SAILOR, MD;  [facility];  Service: Vascular;  Laterality: Left;   LEFT HEART CATH AND CORONARY ANGIOGRAPHY N/A 03/22/2020   Procedure: LEFT HEART CATH AND CORONARY ANGIOGRAPHY;  Surgeon: Jordan, Peter M, MD;  Location: [facility] INVASIVE CV LAB;  Service: Cardiovascular;  Laterality: N/A;   LOWER EXTREMITY ANGIOGRAPHY Bilateral 06/15/2019   Procedure: Lower Extremity Angiography;  Surgeon: Court Dorn PARAS, MD;  Location: [facility] INVASIVE CV LAB;  Service: Cardiovascular;  Laterality: Bilateral;   LOWER EXTREMITY ANGIOGRAPHY Right 08/03/2019   Procedure: LOWER EXTREMITY ANGIOGRAPHY;  Surgeon: Court Dorn PARAS, MD;  [facility];  Service: Cardiovascular;  Laterality: Right;   LOWER EXTREMITY ANGIOGRAPHY N/A 09/07/2021   Procedure: LOWER EXTREMITY ANGIOGRAPHY;  Surgeon: Gretta Bruckner PARAS, MD;  [facility];  Service: Cardiovascular;  Laterality: N/A;   PATCH ANGIOPLASTY Right 07/21/2019   Procedure: Patch Angioplasty Right Femoral Artery;  Surgeon: Serene Gaile ORN, MD;  Location: [facility] OR;  Service: Vascular;  Laterality: Right;   PERIPHERAL INTRAVASCULAR LITHOTRIPSY  11/25/2019   Procedure: INTRAVASCULAR LITHOTRIPSY;  Surgeon: Darron Deatrice LABOR, MD;  [facility];  Service: Cardiovascular;;  LT. SFA   PERIPHERAL VASCULAR ATHERECTOMY  08/03/2019   Procedure: PERIPHERAL VASCULAR ATHERECTOMY;  Surgeon: Court Dorn PARAS, MD;  Location: [facility] INVASIVE CV LAB;  Service: Cardiovascular;;  right SFA, right TP trunk   PERIPHERAL VASCULAR ATHERECTOMY  11/25/2019   Procedure: PERIPHERAL VASCULAR ATHERECTOMY;  Surgeon: Darron Deatrice LABOR, MD;  [facility];  Service: Cardiovascular;;  Lt.  POPLITEAL and AT   PERIPHERAL VASCULAR BALLOON ANGIOPLASTY Left 06/15/2019   Procedure: PERIPHERAL VASCULAR BALLOON ANGIOPLASTY;  Surgeon: Court Dorn PARAS, MD;   [facility];  Service: Cardiovascular;  Laterality: Left;  SFA UNSUCCESSFUL UNABLE TO CROSS LESION   PERIPHERAL VASCULAR BALLOON ANGIOPLASTY  08/03/2019   Procedure: PERIPHERAL VASCULAR BALLOON ANGIOPLASTY;  Surgeon: Court Dorn PARAS, MD;  [facility];  Service: Cardiovascular;;  right SFA, Right TP trunk   PERIPHERAL VASCULAR BALLOON ANGIOPLASTY  09/07/2021   Procedure: PERIPHERAL VASCULAR BALLOON ANGIOPLASTY;  Surgeon: Gretta Bruckner PARAS, MD;  [facility];  Service: Cardiovascular;;   PERIPHERAL VASCULAR BALLOON ANGIOPLASTY Right 10/13/2021   Procedure: PERIPHERAL VASCULAR BALLOON ANGIOPLASTY;  Surgeon: Magda Debby SAILOR, MD;  [facility];  Service: Cardiovascular;  Laterality: Right;   PERIPHERAL VASCULAR BALLOON ANGIOPLASTY  11/09/2022   Procedure: PERIPHERAL VASCULAR BALLOON ANGIOPLASTY;  Surgeon: Magda Debby SAILOR, MD;  [facility];  Service: Cardiovascular;;  fem-pop bypass and AT   PORTACATH PLACEMENT Left 04/29/2023   Procedure: INSERTION PORT-A-CATH;  Surgeon: Dasie Leonor CROME, MD;  Location: [facility] ORS;  Service: General;  Laterality: Left;   PORTACATH PLACEMENT N/A 07/12/2023   Procedure: REVISION PORT-A-CATH;  Surgeon: Dasie Leonor CROME, MD;  Location: [facility] OR;  Service: General;  Laterality: N/A;   REMOVAL OF STONES  04/07/2023   Procedure: REMOVAL OF STONES;  Surgeon: Rosalie Kitchens, MD;  Location: [facility] ENDOSCOPY;  Service: Gastroenterology;;   ANNETT  04/07/2023   Procedure: ANNETT;  Surgeon: Rosalie Kitchens, MD;  Location: [facility] ENDOSCOPY;  Service: Gastroenterology;;   TEE WITHOUT CARDIOVERSION N/A 03/24/2020   Procedure: TRANSESOPHAGEAL ECHOCARDIOGRAM (TEE);  Surgeon: German Bartlett PEDLAR, MD;  Location: [facility] OR;  Service: Open Heart Surgery;  Laterality: N/A;   TONSILLECTOMY     and adenoidectomy   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/2019   Procedure: TRANSMETATARSAL AMPUTATION;  Surgeon: Gershon Donnice SAUNDERS, DPM;  [facility] OR;  Service: Podiatry;  Laterality: Right;   TRANSMETATARSAL AMPUTATION Left 05/11/2021   Procedure: LEFT TRANSMETATARSAL AMPUTATION;  Surgeon: Sheree Penne Bruckner, MD;  Location: [facility] OR;  Service: Vascular;  Laterality: Left;   TUBAL LIGATION      ALLERGIES: Allergies  Allergen Reactions   Versed  [Midazolam ] Anxiety    Frantic, out of my mind, agitated    Augmentin [Amoxicillin-Pot Clavulanate] Other (See Comments)    Elevates potassium level   Bactrim  [Sulfamethoxazole -Trimethoprim ] Other (See Comments)    ^ K+( elevated)    Demerol [Meperidine]     Delusional    Meperidine Hcl     Other reaction(s): delusional   Scopolamine     Delusional   Patch *   Tramadol  Other (See Comments)    Keeps awake    FAMILY HISTORY:  Family History  Problem Relation Age of Onset   Cancer - Prostate Father    Cancer - Colon Father    Stroke Mother    Hypertension Mother    Hyperlipidemia Mother    Melanoma Brother     SOCIAL HISTORY: Social History   Socioeconomic History   Marital status: Widowed    Spouse name: Not on file   Number of children: 3   Years of education: Not on file   Highest education level: Not on file  Occupational History   Not on file  Tobacco Use   Smoking status: Former    Current packs/day: 0.00    Types: Cigarettes    Quit date: 03/31/1972    Years since quitting: 51.5   Smokeless tobacco: Never  Vaping Use   Vaping status: Never Used  Substance and Sexual Activity   Alcohol  use: No   Drug use: No   Sexual activity: Not on file  Other Topics Concern   Not on file  Social History Narrative   Not on file   Social Drivers of Health   Financial Resource Strain: Not on file  Food Insecurity: No Food Insecurity (04/05/2023)   Hunger Vital Sign    Worried About Running Out of Food in the Last Year: Never true    Ran Out of Food in the Last Year: Never true  Transportation Needs: No Transportation Needs  (04/05/2023)   PRAPARE - Administrator, Civil Service (Medical): No    Lack of Transportation (Non-Medical): No  Physical Activity: Not  on file  Stress: Not on file  Social Connections: Unknown (02/02/2022)   Received from [facility]   Social Network    Social Network: Not on file    MEDICATIONS:  Current Outpatient Medications  Medication Sig Dispense Refill   aspirin  EC 81 MG tablet Take 81 mg by mouth at bedtime. Swallow whole.     atorvastatin  (LIPITOR ) 80 MG tablet Take 1 tablet (80 mg total) by mouth daily. 90 tablet 3   brimonidine  (ALPHAGAN ) 0.2 % ophthalmic solution Place 1 drop into the right eye 2 (two) times daily.     empagliflozin  (JARDIANCE ) 25 MG TABS tablet Take 1 tablet (25 mg total) by mouth daily before breakfast. 90 tablet 3   Glucagon  (GVOKE HYPOPEN  1-PACK) 1 MG/0.2ML SOAJ Inject 1 mg into the skin as needed (low blood sugar with impaired consciousness). 0.4 mL 2   hydrochlorothiazide  (HYDRODIURIL ) 12.5 MG tablet Take 1 tablet (12.5 mg total) by mouth daily. 90 tablet 0   Insulin  Pen Needle (ULTRA-THIN II MINI PEN NEEDLE) 31G X 5 MM MISC Use as directed to check blood glucose 50 each 2   Lancets (ONETOUCH DELICA PLUS LANCET33G) MISC Use to check blood sugars twice daily. (Patient taking differently: Use to check blood sugars  daily.) 100 each 5   lidocaine -prilocaine  (EMLA ) cream Apply to affected area once (Patient taking differently: Apply 1 Application topically as needed (port).) 30 g 3   MAGNESIUM -OXIDE 400 (240 Mg) MG tablet Take 1 tablet by mouth twice daily 60 tablet 0   metFORMIN  (GLUCOPHAGE ) 1000 MG tablet Take 1 tablet (1,000 mg total) by mouth 2 (two) times daily. 180 tablet 2   metoprolol  tartrate (LOPRESSOR ) 50 MG tablet Take 1 tablet by mouth twice daily 180 tablet 0   Multiple Vitamins-Minerals (MULTI FOR HER PO) Take 1 tablet by mouth daily.     mupirocin  ointment (BACTROBAN ) 2 % Apply 1 Application topically 2 (two) times  daily. (Patient taking differently: Apply 1 Application topically 2 (two) times daily as needed (wound care for toes).) 30 g 2   ondansetron  (ZOFRAN ) 8 MG tablet Take 1 tablet (8 mg total) by mouth every 8 (eight) hours as needed for nausea or vomiting. Start on the third day after cisplatin . 30 tablet 1   ONETOUCH VERIO test strip USE TO CHECK BLOOD SUGARS ONCE DAILY 100 each 2   pantoprazole  (PROTONIX ) 40 MG tablet Take 1 tablet by mouth once daily 90 tablet 3   prochlorperazine  (COMPAZINE ) 10 MG tablet Take 1 tablet (10 mg total) by mouth every 6 (six) hours as needed (Nausea or vomiting). 30 tablet 1   Propylene Glycol (SYSTANE BALANCE) 0.6 % SOLN Place 1 drop into the left eye 3 (three) times daily as needed (dry/irritated eyes).     rivaroxaban  (XARELTO ) 2.5 MG TABS tablet Take 1 tablet by mouth twice daily 60 tablet 11   telmisartan  (MICARDIS ) 80 MG tablet Take 1 tablet by mouth once daily 90 tablet 0   Insulin  Lispro Prot & Lispro (HUMALOG  MIX 75/25 KWIKPEN) (75-25) 100 UNIT/ML Kwikpen INJECT 6 UNITS SUBCUTANEOUSLY ONCE DAILY WITH BREAKFAST 15 mL 4   repaglinide  (PRANDIN ) 1 MG tablet Take 1 tablet (1 mg total) by mouth 2 (two) times daily before a meal. Take 1 tab by breakfast and 1 tab before dinner 180 tablet 4   No current facility-administered medications for this visit.    PHYSICAL EXAM: Vitals:   09/26/23 1314  BP: 118/60  Pulse: 71  Resp:  20  SpO2: 97%  Weight: 141 lb 3.2 oz (64 kg)  Height: 5' 5.5 (1.664 m)    Body mass index is 23.14 kg/m.  Wt Readings from Last 3 Encounters:  09/26/23 141 lb 3.2 oz (64 kg)  09/13/23 137 lb 8 oz (62.4 kg)  08/30/23 135 lb 12.8 oz (61.6 kg)    General: Well developed, well nourished female in no apparent distress.  HEENT: AT/[place], no external lesions.  Eyes: Conjunctiva clear and no icterus. Neck: Neck supple  Lungs: Respirations not labored Neurologic: Alert, oriented, normal speech Extremities / Skin: Dry.   Psychiatric: Does  not appear depressed or anxious  Diabetic Foot Exam - Simple   No data filed    LABS Reviewed Lab Results  Component Value Date   HGBA1C 6.6 (H) 09/23/2023   HGBA1C 7.2 (H) 06/21/2023   HGBA1C 7.9 (H) 03/11/2023   Lab Results  Component Value Date   FRUCTOSAMINE 261 11/28/2021   FRUCTOSAMINE 275 09/29/2021   FRUCTOSAMINE 237 03/29/2021   Lab Results  Component Value Date   CHOL 117 09/23/2023   HDL 42 (L) 09/23/2023   LDLCALC 53 09/23/2023   TRIG 140 09/23/2023   CHOLHDL 2.8 09/23/2023   Lab Results  Component Value Date   MICRALBCREAT 57 (H) 09/23/2023   MICRALBCREAT 54.0 (H) 08/28/2022   Lab Results  Component Value Date   CREATININE 1.24 (H) 09/23/2023   Lab Results  Component Value Date   GFR 66.19 06/21/2023    ASSESSMENT / PLAN  1. Uncontrolled type 2 diabetes mellitus with hypoglycemia without coma (HCC)     Diabetes Mellitus type 2, complicated by diabetic retinopathy/PAD/microalbuminuria - Diabetic status / severity: Fair control with hypoglycemia.  Lab Results  Component Value Date   HGBA1C 6.6 (H) 09/23/2023    - Hemoglobin A1c goal : <7.5%  She has frequent hypoglycemia in the morning fasting.  Blood sugar in 60s.  - Medications: See below.  Change the diabetes regimen as follows. Humalog  mix 7 units with breakfast, decreased to 6 units. Metformin  1000 mg BID Jardiance  25 mg daily. Prandin  2mg  BID, decreased to 1 mg 2 times a day with meals breakfast and supper.  - Home glucose testing: In the morning fasting and at bedtime and other times of the day few times a week. - Discussed/ Gave Hypoglycemia treatment plan.  Glucagon  Emergency Kit prescribed.  # Consult : not required at this time.   # Annual urine for microalbuminuria/ creatinine ratio, no microalbuminuria currently, continue ACE/ARB , telmisartan . Last  Lab Results  Component Value Date   MICRALBCREAT 57 (H) 09/23/2023    # Foot check nightly.  # She has diabetic  retinopathy follow-up with ophthalmology.  2. Blood pressure  -  BP Readings from Last 1 Encounters:  09/26/23 118/60    - Control is in target.  - No change in current plans.  3. Lipid status / Hyperlipidemia - Last  Lab Results  Component Value Date   LDLCALC 53 09/23/2023   - Continue atorvastatin  80 mg daily.    Diagnoses and all orders for this visit:  Uncontrolled type 2 diabetes mellitus with hypoglycemia without coma (HCC) -     repaglinide  (PRANDIN ) 1 MG tablet; Take 1 tablet (1 mg total) by mouth 2 (two) times daily before a meal. Take 1 tab by breakfast and 1 tab before dinner -     BASIC METABOLIC PANEL WITH GFR -     Hemoglobin A1c  Other orders -  Insulin  Lispro Prot & Lispro (HUMALOG  MIX 75/25 KWIKPEN) (75-25) 100 UNIT/ML Kwikpen; INJECT 6 UNITS SUBCUTANEOUSLY ONCE DAILY WITH BREAKFAST -     Glucagon  (GVOKE HYPOPEN  1-PACK) 1 MG/0.2ML SOAJ; Inject 1 mg into the skin as needed (low blood sugar with impaired consciousness).     DISPOSITION Follow up in clinic in  3 months suggested.  Encouraged to call to our clinic with any questions regarding blood sugar in between the visits.   All questions answered and patient verbalized understanding of the plan.  Trason Shifflet, MD [facility] Endocrinology [facility] [address] [place], [address] [place], [place] [address] Phone # [phone]  At least part of this note was generated using voice recognition software. Inadvertent word errors may have occurred, which were not recognized during the proofreading process.

## 2023-09-27 ENCOUNTER — Encounter: Payer: Self-pay | Admitting: Nurse Practitioner

## 2023-09-27 ENCOUNTER — Other Ambulatory Visit: Payer: Medicare Other

## 2023-09-27 ENCOUNTER — Inpatient Hospital Stay: Payer: Medicare Other

## 2023-09-27 ENCOUNTER — Ambulatory Visit (HOSPITAL_COMMUNITY)
Admission: RE | Admit: 2023-09-27 | Discharge: 2023-09-27 | Disposition: A | Discharge status: Home / Self Care | Payer: Medicare Other | Source: Ambulatory Visit | Attending: Hematology | Admitting: Hematology

## 2023-09-27 ENCOUNTER — Other Ambulatory Visit: Payer: Self-pay

## 2023-09-27 ENCOUNTER — Inpatient Hospital Stay (HOSPITAL_BASED_OUTPATIENT_CLINIC_OR_DEPARTMENT_OTHER): Payer: Medicare Other | Attending: Hematology | Admitting: Nurse Practitioner

## 2023-09-27 ENCOUNTER — Inpatient Hospital Stay: Payer: Medicare Other | Attending: Hematology

## 2023-09-27 VITALS — BP 160/57 | HR 96 | Temp 98.0°F | Resp 16 | Ht 65.5 in | Wt 141.8 lb

## 2023-09-27 VITALS — BP 172/65 | HR 79

## 2023-09-27 DIAGNOSIS — R935 Abnormal findings on diagnostic imaging of other abdominal regions, including retroperitoneum: Secondary | ICD-10-CM | POA: Diagnosis not present

## 2023-09-27 DIAGNOSIS — I700 Atherosclerosis of aorta: Secondary | ICD-10-CM | POA: Diagnosis not present

## 2023-09-27 DIAGNOSIS — Z5111 Encounter for antineoplastic chemotherapy: Secondary | ICD-10-CM | POA: Insufficient documentation

## 2023-09-27 DIAGNOSIS — D6481 Anemia due to antineoplastic chemotherapy: Secondary | ICD-10-CM | POA: Diagnosis not present

## 2023-09-27 DIAGNOSIS — C23 Malignant neoplasm of gallbladder: Secondary | ICD-10-CM | POA: Diagnosis not present

## 2023-09-27 DIAGNOSIS — Z79620 Long term (current) use of immunosuppressive biologic: Secondary | ICD-10-CM | POA: Insufficient documentation

## 2023-09-27 DIAGNOSIS — Z452 Encounter for adjustment and management of vascular access device: Secondary | ICD-10-CM | POA: Insufficient documentation

## 2023-09-27 DIAGNOSIS — K5900 Constipation, unspecified: Secondary | ICD-10-CM | POA: Diagnosis not present

## 2023-09-27 DIAGNOSIS — Z95828 Presence of other vascular implants and grafts: Secondary | ICD-10-CM

## 2023-09-27 DIAGNOSIS — K5790 Diverticulosis of intestine, part unspecified, without perforation or abscess without bleeding: Secondary | ICD-10-CM | POA: Insufficient documentation

## 2023-09-27 DIAGNOSIS — C787 Secondary malignant neoplasm of liver and intrahepatic bile duct: Secondary | ICD-10-CM | POA: Diagnosis not present

## 2023-09-27 DIAGNOSIS — Z5112 Encounter for antineoplastic immunotherapy: Secondary | ICD-10-CM | POA: Diagnosis not present

## 2023-09-27 LAB — CBC WITH DIFFERENTIAL (CANCER CENTER ONLY)
Abs Immature Granulocytes: 0.03 10*3/uL (ref 0.00–0.07)
Basophils Absolute: 0.1 10*3/uL (ref 0.0–0.1)
Basophils Relative: 1 %
Eosinophils Absolute: 0.1 10*3/uL (ref 0.0–0.5)
Eosinophils Relative: 1 %
HCT: 25.8 % — ABNORMAL LOW (ref 36.0–46.0)
Hemoglobin: 8.5 g/dL — ABNORMAL LOW (ref 12.0–15.0)
Immature Granulocytes: 0 %
Lymphocytes Relative: 14 %
Lymphs Abs: 1.2 10*3/uL (ref 0.7–4.0)
MCH: 32.8 pg (ref 26.0–34.0)
MCHC: 32.9 g/dL (ref 30.0–36.0)
MCV: 99.6 fL (ref 80.0–100.0)
Monocytes Absolute: 1.1 10*3/uL — ABNORMAL HIGH (ref 0.1–1.0)
Monocytes Relative: 13 %
Neutro Abs: 5.7 10*3/uL (ref 1.7–7.7)
Neutrophils Relative %: 71 %
Platelet Count: 162 10*3/uL (ref 150–400)
RBC: 2.59 MIL/uL — ABNORMAL LOW (ref 3.87–5.11)
RDW: 17.2 % — ABNORMAL HIGH (ref 11.5–15.5)
WBC Count: 8.2 10*3/uL (ref 4.0–10.5)
nRBC: 0 % (ref 0.0–0.2)

## 2023-09-27 LAB — CMP (CANCER CENTER ONLY)
ALT: 14 U/L (ref 0–44)
AST: 31 U/L (ref 15–41)
Albumin: 3.6 g/dL (ref 3.5–5.0)
Alkaline Phosphatase: 94 U/L (ref 38–126)
Anion gap: 9 (ref 5–15)
BUN: 25 mg/dL — ABNORMAL HIGH (ref 8–23)
CO2: 26 mmol/L (ref 22–32)
Calcium: 9.2 mg/dL (ref 8.9–10.3)
Chloride: 103 mmol/L (ref 98–111)
Creatinine: 1.14 mg/dL — ABNORMAL HIGH (ref 0.44–1.00)
GFR, Estimated: 48 mL/min — ABNORMAL LOW (ref >60)
Glucose, Bld: 156 mg/dL — ABNORMAL HIGH (ref 70–99)
Potassium: 4.1 mmol/L (ref 3.5–5.1)
Sodium: 138 mmol/L (ref 135–145)
Total Bilirubin: 0.4 mg/dL (ref 0.0–1.2)
Total Protein: 6.4 g/dL — ABNORMAL LOW (ref 6.5–8.1)

## 2023-09-27 LAB — MAGNESIUM: Magnesium: 1.9 mg/dL (ref 1.7–2.4)

## 2023-09-27 MED ORDER — MAGNESIUM SULFATE 4 GM/100ML IV SOLN
4.0000 g | Freq: Once | INTRAVENOUS | Status: AC
Start: 2023-09-27 — End: 2023-09-27
  Administered 2023-09-27: 4 g via INTRAVENOUS
  Filled 2023-09-27: qty 100

## 2023-09-27 MED ORDER — PALONOSETRON HCL INJECTION 0.25 MG/5ML
0.2500 mg | Freq: Once | INTRAVENOUS | Status: AC
  Administered 2023-09-27: 0.25 mg via INTRAVENOUS
  Filled 2023-09-27: qty 5

## 2023-09-27 MED ORDER — POTASSIUM CHLORIDE IN NACL 20-0.9 MEQ/L-% IV SOLN
Freq: Once | INTRAVENOUS | Status: AC
  Filled 2023-09-27: qty 1000

## 2023-09-27 MED ORDER — IOHEXOL 300 MG/ML  SOLN
100.0000 mL | Freq: Once | INTRAMUSCULAR | Status: AC | PRN
  Administered 2023-09-27: 100 mL via INTRAVENOUS

## 2023-09-27 MED ORDER — SODIUM CHLORIDE 0.9% FLUSH
10.0000 mL | INTRAVENOUS | Status: DC | PRN
  Administered 2023-09-27: 10 mL

## 2023-09-27 MED ORDER — SODIUM CHLORIDE 0.9 % IV SOLN: Freq: Once | INTRAVENOUS | Status: AC

## 2023-09-27 MED ORDER — FOSAPREPITANT DIMEGLUMINE INJECTION 150 MG
150.0000 mg | Freq: Once | INTRAVENOUS | Status: AC
  Administered 2023-09-27: 150 mg via INTRAVENOUS
  Filled 2023-09-27: qty 150

## 2023-09-27 MED ORDER — HEPARIN SOD (PORK) LOCK FLUSH 100 UNIT/ML IV SOLN
500.0000 [IU] | Freq: Once | INTRAVENOUS | Status: AC
  Administered 2023-09-27: 500 [IU] via INTRAVENOUS

## 2023-09-27 MED ORDER — HEPARIN SOD (PORK) LOCK FLUSH 100 UNIT/ML IV SOLN
500.0000 [IU] | Freq: Once | INTRAVENOUS | Status: AC | PRN
  Administered 2023-09-27: 500 [IU]

## 2023-09-27 MED ORDER — SODIUM CHLORIDE 0.9% FLUSH
10.0000 mL | Freq: Once | INTRAVENOUS | Status: AC
Start: 2023-09-27 — End: 2023-09-27
  Administered 2023-09-27: 10 mL

## 2023-09-27 MED ORDER — CISPLATIN CHEMO INJECTION 100MG/100ML
20.0000 mg/m2 | Freq: Once | INTRAVENOUS | Status: AC
  Administered 2023-09-27: 34 mg via INTRAVENOUS
  Filled 2023-09-27: qty 34

## 2023-09-27 MED ORDER — SODIUM CHLORIDE 0.9 % IV SOLN
1500.0000 mg | Freq: Once | INTRAVENOUS | Status: AC
  Administered 2023-09-27: 1500 mg via INTRAVENOUS
  Filled 2023-09-27: qty 30

## 2023-09-27 MED ORDER — SODIUM CHLORIDE 0.9 % IV SOLN
800.0000 mg/m2 | Freq: Once | INTRAVENOUS | Status: AC
  Administered 2023-09-27: 1369 mg via INTRAVENOUS
  Filled 2023-09-27: qty 36.01

## 2023-09-27 NOTE — Patient Instructions (Signed)
 [facility] - A DEPT OF [facility]  Discharge Instructions: Thank you for choosing [facility] to provide your oncology and hematology care.   If you have a lab appointment with the Cancer Center, please go directly to the Cancer Center and check in at the registration area.   Wear comfortable clothing and clothing appropriate for easy access to any Portacath or PICC line.   We strive to give you quality time with your provider. You may need to reschedule your appointment if you arrive late (15 or more minutes).  Arriving late affects you and other patients whose appointments are after yours.  Also, if you miss three or more appointments without notifying the office, you may be dismissed from the clinic at the provider's discretion.      For prescription refill requests, have your pharmacy contact our office and allow 72 hours for refills to be completed.    Today you received the following chemotherapy and/or immunotherapy agents: Imfinzi, Gemzar, Cisplatin      To help prevent nausea and vomiting after your treatment, we encourage you to take your nausea medication as directed.  BELOW ARE SYMPTOMS THAT SHOULD BE REPORTED IMMEDIATELY: *FEVER GREATER THAN 100.4 F (38 C) OR HIGHER *CHILLS OR SWEATING *NAUSEA AND VOMITING THAT IS NOT CONTROLLED WITH YOUR NAUSEA MEDICATION *UNUSUAL SHORTNESS OF BREATH *UNUSUAL BRUISING OR BLEEDING *URINARY PROBLEMS (pain or burning when urinating, or frequent urination) *BOWEL PROBLEMS (unusual diarrhea, constipation, pain near the anus) TENDERNESS IN MOUTH AND THROAT WITH OR WITHOUT PRESENCE OF ULCERS (sore throat, sores in mouth, or a toothache) UNUSUAL RASH, SWELLING OR PAIN  UNUSUAL VAGINAL DISCHARGE OR ITCHING   Items with * indicate a potential emergency and should be followed up as soon as possible or go to the Emergency Department if any problems should occur.  Please show the CHEMOTHERAPY ALERT CARD  or IMMUNOTHERAPY ALERT CARD at check-in to the Emergency Department and triage nurse.  Should you have questions after your visit or need to cancel or reschedule your appointment, please contact [facility]  Dept: [phone]  and follow the prompts.  Office hours are 8:00 a.m. to 4:30 p.m. Monday - Friday. Please note that voicemails left after 4:00 p.m. may not be returned until the following business day.  We are closed weekends and major holidays. You have access to a nurse at all times for urgent questions. Please call the main number to the clinic Dept: [phone] and follow the prompts.   For any non-urgent questions, you may also contact your provider using MyChart. We now offer e-Visits for anyone 65 and older to request care online for non-urgent symptoms. For details visit [email or web address].   Also download the MyChart app! Go to the app store, search "MyChart", open the app, select [place], and log in with your MyChart username and password.

## 2023-10-01 ENCOUNTER — Ambulatory Visit: Payer: Medicare Other | Admitting: Podiatry

## 2023-10-01 ENCOUNTER — Other Ambulatory Visit: Payer: Self-pay

## 2023-10-10 MED FILL — Fosaprepitant Dimeglumine For IV Infusion 150 MG (Base Eq): INTRAVENOUS | Qty: 5 | Status: AC

## 2023-10-10 NOTE — Progress Notes (Signed)
Patient Care Team: Shon Hale, MD as PCP - General (Family Medicine) Runell Gess, MD as PCP - Cardiology (Cardiology) Malachy Mood, MD as Consulting Physician (Oncology)  Clinic Day:  10/11/2023  Referring physician: Malachy Mood, MD  ASSESSMENT & PLAN:   Assessment & Plan: Gallbladder cancer (HCC) cT4N0M0, stage III -Patient had 1 episode of abdominal pain in October 2023, and was found to have a large gallbladder stone. -She underwent laparoscopic attempted cholecystectomy but was not able to be resected due to adhesions to duodenum and colon. -She developed jaundice in June 2024, status post ERCP and stent placement in CBD.  CT and MRI scan showed a liver mass adjacent to the gallbladder fossae, and a biopsy confirmed adenocarcinoma.  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  Unfortunately this was felt to be not resectable by Dr. Freida Busman. -Her CT chest from April 22, 2023, which showed no evidence of distant metastasis. -First-line cisplatin, gemcitabine was recommended every 14 days and durvalumab every 28 days.  If she responded well, will stop chemo after 4 to 6 months of treatment, and change to maintenance therapy. She started on 05/03/23 every 2 weeks and has been tolerating well.  We have also discussed option of consolidation radiation at some point. -restaging CT 07/08/2023 showed partial response in the primary site tumor with direct invasion to liver.  No new disease or question.  Will continue chemotherapy. -09/27/2023  - today is cycle 6 day 1.  Patient tolerating well. 09/27/2023 - restaging CT CAP showed stable, small pulmonary nodules with no evidence of thoracic metastases.  There is stable liver metastasis with no new metastatic lesions in the liver.  There is no new evidence of metastatic disease in the abdomen or pelvis. 10/11/2023 -plan to continue chemotherapy  as planned through cycle 8.  Will discuss her case in multiple disciplinary GI conference on 10/16/2023.  Discussed radiation as option for cancer treatment for this patient.    Plan  -Labs reviewed  --CBC showing WBC 11.1; Hgb 8.0; Hct 24.6; Plt 193; Anc 8.1 --CMP - K 4.0; glucose 125; BUN 28; Creatinine 1.20; eGFR 45; Ca 9.4; LFTs normal.   --Magnesium -1.8 --Thyroid panel pending --CA 19.9 -pending -Reviewed results of CT CAP with patient.  She has small, stable pulmonary nodules with no evidence of thoracic metastases.  Stable liver metastases with no new metastatic lesions in the liver.  No new evidence of metastatic disease in the abdomen or pelvis. -Plan to add her to GI multidisciplinary conference on Wednesday, 10/16/2023.  Specifically would like to speak with radiation oncology to see if radiation may be an option for her cancer treatment. -Patient labs and condition are stable for chemotherapy treatment.  Today is cycle 6 day 1 of cisplatin and gemcitabine every 2 weeks with addition of durvalumab every 4 weeks. -Labs/flush, follow-up with Dr. Mosetta Putt, and chemotherapy as currently scheduled.  The patient understands the plans discussed today and is in agreement with them.  She knows to contact our office if she develops concerns prior to her next appointment.  I provided 30 minutes of face-to-face time during this encounter and > 50% was spent counseling as documented under my assessment and plan.    Carlean Jews, NP  [facility] - [facility] DEPT [facility] [address] [place] [address]  Dept: [phone] Dept Fax: [phone]   Orders Placed This Encounter  Procedures   CBC with Differential (Cancer Center Only)    Standing Status:   Future    Expected Date:   11/22/2023    Expiration Date:   11/21/2024   CMP (Cancer Center only)    Standing Status:   Future    Expected Date:   11/22/2023    Expiration  Date:   11/21/2024   Magnesium    Standing Status:   Future    Expected Date:   11/22/2023    Expiration Date:   11/21/2024   CBC with Differential (Cancer Center Only)    Standing Status:   Future    Expected Date:   12/06/2023    Expiration Date:   12/05/2024   CMP (Cancer Center only)    Standing Status:   Future    Expected Date:   12/06/2023    Expiration Date:   12/05/2024   T4    Standing Status:   Future    Expected Date:   12/06/2023    Expiration Date:   12/05/2024   TSH    Standing Status:   Future    Expected Date:   12/06/2023    Expiration Date:   12/05/2024   Magnesium    Standing Status:   Future    Expected Date:   12/06/2023    Expiration Date:   12/05/2024      CHIEF COMPLAINT:  CC: Gallbladder cancer  Current Treatment: Cisplatin and gemcitabine every 2 weeks with durvalumab every 4 weeks  INTERVAL HISTORY:  Ahria is here today for repeat clinical assessment.  She was last seen by myself on 09/27/2023.  She had CT chest abdomen pelvis on 09/27/2023.  She has stable small pulmonary nodules with no evidence of thoracic metastases.  There is stable hepatic metastases with no new hepatic lesions.  There is no evidence of metastatic adenopathy in the abdomen or pelvis.  There is an intrahepatic and extrahepatic biliary stent in place with no biliary duct dilation.  Ms. Haynie reports feeling well.  She does have sinus drainage which is "annoying" but manageable. She denies chest pain, chest pressure, or shortness of breath. She denies headaches or visual disturbances. She denies abdominal pain, nausea, vomiting, or changes in bowel or bladder habits.   She denies fevers or chills. She denies pain. Her appetite is good. Her weight has been stable.  I have reviewed the past medical history, past surgical history, social history and family history with the patient and they are unchanged from previous note.  ALLERGIES:  is allergic to versed [midazolam], augmentin [amoxicillin-pot  clavulanate], bactrim [sulfamethoxazole-trimethoprim], demerol [meperidine], meperidine hcl, scopolamine, and tramadol.  MEDICATIONS:  Current Outpatient Medications  Medication Sig Dispense Refill   aspirin EC 81 MG tablet Take 81 mg by mouth at bedtime. Swallow whole.     atorvastatin (LIPITOR) 80 MG tablet Take 1 tablet (80 mg total) by mouth daily. 90 tablet 3   brimonidine (ALPHAGAN) 0.2 % ophthalmic solution Place 1 drop into the right eye 2 (two) times daily.     empagliflozin (JARDIANCE) 25 MG TABS tablet Take 1 tablet (25 mg total) by mouth daily before breakfast. 90 tablet 3   hydrochlorothiazide (HYDRODIURIL) 12.5 MG tablet Take 1 tablet (12.5 mg total) by mouth daily. 90 tablet 0   Insulin Lispro Prot & Lispro (HUMALOG MIX 75/25 KWIKPEN) (75-25) 100 UNIT/ML Kwikpen INJECT 6 UNITS SUBCUTANEOUSLY ONCE DAILY WITH BREAKFAST 15  mL 4   Insulin Pen Needle (ULTRA-THIN II MINI PEN NEEDLE) 31G X 5 MM MISC Use as directed to check blood glucose 50 each 2   Lancets (ONETOUCH DELICA PLUS LANCET33G) MISC Use to check blood sugars twice daily. (Patient taking differently: Use to check blood sugars  daily.) 100 each 5   lidocaine-prilocaine (EMLA) cream Apply to affected area once (Patient taking differently: Apply 1 Application topically as needed (port).) 30 g 3   MAGNESIUM-OXIDE 400 (240 Mg) MG tablet Take 1 tablet by mouth twice daily 60 tablet 0   metFORMIN (GLUCOPHAGE) 1000 MG tablet Take 1 tablet (1,000 mg total) by mouth 2 (two) times daily. 180 tablet 2   metoprolol tartrate (LOPRESSOR) 50 MG tablet Take 1 tablet by mouth twice daily 180 tablet 0   Multiple Vitamins-Minerals (MULTI FOR HER PO) Take 1 tablet by mouth daily.     mupirocin ointment (BACTROBAN) 2 % Apply 1 Application topically 2 (two) times daily. (Patient taking differently: Apply 1 Application topically 2 (two) times daily as needed (wound care for toes).) 30 g 2   ondansetron (ZOFRAN) 8 MG tablet Take 1 tablet (8 mg total) by  mouth every 8 (eight) hours as needed for nausea or vomiting. Start on the third day after cisplatin. 30 tablet 1   ONETOUCH VERIO test strip USE TO CHECK BLOOD SUGARS ONCE DAILY 100 each 2   pantoprazole (PROTONIX) 40 MG tablet Take 1 tablet by mouth once daily 90 tablet 3   prochlorperazine (COMPAZINE) 10 MG tablet Take 1 tablet (10 mg total) by mouth every 6 (six) hours as needed (Nausea or vomiting). 30 tablet 1   Propylene Glycol (SYSTANE BALANCE) 0.6 % SOLN Place 1 drop into the left eye 3 (three) times daily as needed (dry/irritated eyes).     repaglinide (PRANDIN) 1 MG tablet Take 1 tablet (1 mg total) by mouth 2 (two) times daily before a meal. Take 1 tab by breakfast and 1 tab before dinner 180 tablet 4   rivaroxaban (XARELTO) 2.5 MG TABS tablet Take 1 tablet by mouth twice daily 60 tablet 11   telmisartan (MICARDIS) 80 MG tablet Take 1 tablet by mouth once daily 90 tablet 0   Glucagon (GVOKE HYPOPEN 1-PACK) 1 MG/0.2ML SOAJ Inject 1 mg into the skin as needed (low blood sugar with impaired consciousness). (Patient not taking: Reported on 10/11/2023) 0.4 mL 2   No current facility-administered medications for this visit.   Facility-Administered Medications Ordered in Other Visits  Medication Dose Route Frequency Provider Last Rate Last Admin   0.9 % NaCl with KCl 20 mEq/ L  infusion   Intravenous Once Malachy Mood, MD       CISplatin (PLATINOL) 34 mg in sodium chloride 0.9 % 250 mL chemo infusion  20 mg/m2 (Treatment Plan Recorded) Intravenous Once Malachy Mood, MD       fosaprepitant (EMEND) 150 mg in sodium chloride 0.9 % 145 mL IVPB  150 mg Intravenous Once Malachy Mood, MD       gemcitabine (GEMZAR) 1,369 mg in sodium chloride 0.9 % 250 mL chemo infusion  800 mg/m2 (Treatment Plan Recorded) Intravenous Once Malachy Mood, MD       heparin lock flush 100 unit/mL  500 Units Intracatheter Once PRN Malachy Mood, MD       magnesium sulfate IVPB 4 g 100 mL  4 g Intravenous Once Malachy Mood, MD        palonosetron (ALOXI) injection 0.25 mg  0.25 mg Intravenous  Once Malachy Mood, MD       sodium chloride flush (NS) 0.9 % injection 10 mL  10 mL Intracatheter PRN Malachy Mood, MD        HISTORY OF PRESENT ILLNESS:   Oncology History Overview Note   Cancer Staging  Gallbladder cancer [facility]) Staging form: Distal Bile Duct, AJCC 8th Edition - Clinical stage from 04/09/2023: Stage IIIB (cT4, cN0, cM0) - Signed by Malachy Mood, MD on 04/18/2023 Total positive nodes: 0 Histologic grade (G): G2 Histologic grading system: 3 grade system     Gallbladder cancer (HCC)  04/04/2023 Imaging    IMPRESSION: 1. Ill-defined hypodensity in the region of the gallbladder fossa appears similar to the prior study given lack of contrast. This would be better evaluated with dedicated MRI. 2. Nonobstructing right renal calculus. 3. Large amount of stool throughout the colon. 4. Stable indeterminate lucent areas in the vertebral bodies of the lumbar spine. 5. Severe atherosclerotic disease.     04/09/2023 Pathology Results     FINAL MICROSCOPIC DIAGNOSIS:   A. LIVER, ADJACENT TO GALLBLADDER FOSSA, BIOPSY:  - Moderately differentiated adenocarcinoma    04/09/2023 Cancer Staging   Staging form: Distal Bile Duct, AJCC 8th Edition - Clinical stage from 04/09/2023: Stage IIIB (cT4, cN0, cM0) - Signed by Malachy Mood, MD on 04/18/2023 Total positive nodes: 0 Histologic grade (G): G2 Histologic grading system: 3 grade system   04/18/2023 Initial Diagnosis   Cholangiocarcinoma of liver (HCC)   05/03/2023 -  Chemotherapy   Patient is on Treatment Plan : BILIARY TRACT Cisplatin + Gemcitabine D1,15 + Durvalumab (1500) D1 q28d / Durvalumab (1500) q28d     07/08/2023 Imaging   CT Chest abdomen and pelvis with contrast  IMPRESSION: CT CHEST IMPRESSION  1.  No acute process or evidence of metastatic disease in the chest. 2. Left Port-A-Cath tip in azygous vein. This is being adjusted by interventional radiology later  today. 3. Ongoing stability of tiny bilateral pulmonary nodules, favored to be benign. 4.  Aortic Atherosclerosis (ICD10-I70.0).  CT ABDOMEN AND PELVIS IMPRESSION  1. The segment 5 liver lesion is decreased in size compared to the MRI of 04/05/2023. 2. Placement of a biliary stent with improvement, near complete resolution of duct dilatation. 3. No abdominopelvic adenopathy. 4. Proximal gastric underdistention. Apparent wall thickening could be secondary. Correlate with symptoms of gastritis 5. Subtle findings within the superior spleen for which small volume interval splenic infarct cannot be excluded. Correlate with left upper quadrant symptoms. 6.  Possible constipation.     09/27/2023 Imaging   CT chest abdomen and pelvis with contrast  IMPRESSION: CHEST:  1. Stable small pulmonary nodules. 2. No evidence of thoracic metastasis.  PELVIS:  1. Stable hepatic metastasis. 2. No new hepatic lesions. 3. No evidence of metastatic adenopathy in the abdomen pelvis. 4. Intrahepatic and extrahepatic biliary stent in place. No biliary duct dilatation. 5.  Aortic Atherosclerosis (ICD10-I70.0).       REVIEW OF SYSTEMS:   Constitutional: Denies fevers, chills or abnormal weight loss Eyes: Denies blurriness of vision Ears, nose, mouth, throat, and face: Denies mucositis or sore throat.  Reports mild sinus drainage which is chronic. Respiratory: Denies cough, dyspnea or wheezes Cardiovascular: Denies palpitation, chest discomfort or lower extremity swelling Gastrointestinal:  Denies nausea, heartburn or change in bowel habits.  States she will oscillate between constipation and diarrhea.  Home medications MiraLAX and Colace help with this. Skin: Denies abnormal skin rashes Lymphatics: Denies new lymphadenopathy or easy bruising Neurological:Denies numbness,  tingling or new weaknesses Behavioral/Psych: Mood is stable, no new changes  All other systems were reviewed with the patient and are  negative.   VITALS:   Today's Vitals   10/11/23 0809 10/11/23 0811 10/11/23 0821  BP: (!) 119/39 (!) 116/37   Pulse: 79    Resp: 16    Temp: 98.7 F (37.1 C)    TempSrc: Temporal    SpO2: 100%    Weight: 140 lb 4.8 oz (63.6 kg)    PainSc:   0-No pain   Body mass index is 22.99 kg/m.    Wt Readings from Last 3 Encounters:  10/11/23 140 lb 4.8 oz (63.6 kg)  09/27/23 141 lb 12.8 oz (64.3 kg)  09/26/23 141 lb 3.2 oz (64 kg)    Body mass index is 22.99 kg/m.  Performance status (ECOG): 1 - Symptomatic but completely ambulatory  PHYSICAL EXAM:   GENERAL:alert, no distress and comfortable SKIN: skin color, texture, turgor are normal, no rashes or significant lesions EYES: normal, Conjunctiva are pink and non-injected, sclera clear OROPHARYNX:no exudate, no erythema and lips, buccal mucosa, and tongue normal  NECK: supple, thyroid normal size, non-tender, without nodularity LYMPH:  no palpable lymphadenopathy in the cervical, axillary or inguinal LUNGS: clear to auscultation and percussion with normal breathing effort HEART: regular rate & rhythm and no murmurs and no lower extremity edema ABDOMEN:abdomen soft, non-tender and normal bowel sounds Musculoskeletal:no cyanosis of digits and no clubbing  NEURO: alert & oriented x 3 with fluent speech, no focal motor/sensory deficits  LABORATORY DATA:  I have reviewed the data as listed    Component Value Date/Time   NA 137 10/11/2023 0746   NA 142 07/29/2020 0942   K 4.0 10/11/2023 0746   CL 101 10/11/2023 0746   CO2 27 10/11/2023 0746   GLUCOSE 125 (H) 10/11/2023 0746   BUN 28 (H) 10/11/2023 0746   BUN 17 07/29/2020 0942   CREATININE 1.20 (H) 10/11/2023 0746   CREATININE 1.24 (H) 09/23/2023 0828   CALCIUM 9.4 10/11/2023 0746   PROT 6.2 (L) 10/11/2023 0746   PROT 6.3 07/28/2020 0945   ALBUMIN 3.5 10/11/2023 0746   ALBUMIN 4.2 07/28/2020 0945   AST 26 10/11/2023 0746   ALT 11 10/11/2023 0746   ALKPHOS 83 10/11/2023  0746   BILITOT 0.5 10/11/2023 0746   GFRNONAA 45 (L) 10/11/2023 0746   GFRAA 75 07/29/2020 0942   Lab Results  Component Value Date   WBC 11.1 (H) 10/11/2023   NEUTROABS 8.1 (H) 10/11/2023   HGB 8.0 (L) 10/11/2023   HCT 24.6 (L) 10/11/2023   MCV 97.2 10/11/2023   PLT 193 10/11/2023    RADIOGRAPHIC STUDIES: CT CHEST ABDOMEN PELVIS W CONTRAST Result Date: 10/04/2023 CLINICAL DATA:  Gallbladder cancer. Assess treatment response. * Tracking Code: BO *. EXAM: CT CHEST, ABDOMEN, AND PELVIS WITH CONTRAST TECHNIQUE: Multidetector CT imaging of the chest, abdomen and pelvis was performed following the standard protocol during bolus administration of intravenous contrast. RADIATION DOSE REDUCTION: This exam was performed according to the departmental dose-optimization program which includes automated exposure control, adjustment of the mA and/or kV according to patient size and/or use of iterative reconstruction technique. CONTRAST:  OMNIPAQUE IOHEXOL 300 MG/ML  SOLN COMPARISON:  CT 07/08/2023 FINDINGS: CT CHEST FINDINGS Cardiovascular: Port in the anterior chest wall with tip in distal SVC. Post CABG. Mediastinum/Nodes: No axillary or supraclavicular adenopathy. No mediastinal or hilar adenopathy. No pericardial fluid. Esophagus normal. Lungs/Pleura: Several small peripheral subpleural nodules are  again noted. Example cluster of nodules in the LEFT upper lobe on images 54 through 60 year less than 4 mm not changed from prior. No new pulmonary nodules are present. No enlargement previous described nodules Musculoskeletal: No aggressive osseous lesion. CT ABDOMEN AND PELVIS FINDINGS Hepatobiliary: Hypoenhancing lesion the central liver measures 2.3 x 2.0 cm compared to 2.3 x 2.1 cm for no interval change. No new hepatic lesions are present. Intrahepatic and extrahepatic biliary stent is noting extending from the RIGHT hepatic lobe through the common bile duct to the duodenum. No intrahepatic biliary duct  dilatation. Pancreas: Pancreas is normal. No ductal dilatation. No pancreatic inflammation. 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 Adrenals/urinary tract: Adrenal glands and kidneys are normal. The ureters and bladder normal. Stomach/Bowel: The stomach, duodenum, and small bowel normal. The colon and rectosigmoid colon are normal. Vascular/Lymphatic: Abdominal aorta is normal caliber with atherosclerotic calcification. There is no retroperitoneal or periportal lymphadenopathy. No pelvic lymphadenopathy. Reproductive: Prostate unremarkable Other: No free fluid. Musculoskeletal: No aggressive osseous lesion. IMPRESSION: CHEST: 1. Stable small pulmonary nodules. 2. No evidence of thoracic metastasis. PELVIS: 1. Stable hepatic metastasis. 2. No new hepatic lesions. 3. No evidence of metastatic adenopathy in the abdomen pelvis. 4. Intrahepatic and extrahepatic biliary stent in place. No biliary duct dilatation. 5.  Aortic Atherosclerosis (ICD10-I70.0). Electronically Signed   By: Genevive Bi M.D.   On: 10/04/2023 18:38

## 2023-10-10 NOTE — Assessment & Plan Note (Addendum)
cT4N0M0, stage III -Patient had 1 episode of abdominal pain in October 2023, and was found to have a large gallbladder stone. -She underwent laparoscopic attempted cholecystectomy but was not able to be resected due to adhesions to duodenum and colon. -She developed jaundice in June 2024, status post ERCP and stent placement in CBD.  CT and MRI scan showed a liver mass adjacent to the gallbladder fossae, and a biopsy confirmed adenocarcinoma.  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  Unfortunately this was felt to be not resectable by Dr. Freida Busman. -Her CT chest from April 22, 2023, which showed no evidence of distant metastasis. -First-line cisplatin, gemcitabine was recommended every 14 days and durvalumab every 28 days.  If she responded well, will stop chemo after 4 to 6 months of treatment, and change to maintenance therapy. She started on 05/03/23 every 2 weeks and has been tolerating well.  We have also discussed option of consolidation radiation at some point. -restaging CT 07/08/2023 showed partial response in the primary site tumor with direct invasion to liver.  No new disease or question.  Will continue chemotherapy. -09/27/2023  - today is cycle 6 day 1.  Patient tolerating well. 09/27/2023 - restaging CT CAP showed stable, small pulmonary nodules with no evidence of thoracic metastases.  There is stable liver metastasis with no new metastatic lesions in the liver.  There is no new evidence of metastatic disease in the abdomen or pelvis. 10/11/2023 -plan to continue chemotherapy as planned through cycle 8.  Will discuss her case in multiple disciplinary GI conference on 10/16/2023.  Discussed radiation as option for cancer treatment for this patient.

## 2023-10-11 ENCOUNTER — Inpatient Hospital Stay (HOSPITAL_BASED_OUTPATIENT_CLINIC_OR_DEPARTMENT_OTHER): Payer: Medicare Other | Admitting: Nurse Practitioner

## 2023-10-11 ENCOUNTER — Inpatient Hospital Stay: Payer: Medicare Other

## 2023-10-11 ENCOUNTER — Encounter: Payer: Self-pay | Admitting: Nurse Practitioner

## 2023-10-11 VITALS — BP 116/37 | HR 79 | Temp 98.7°F | Resp 16 | Wt 140.3 lb

## 2023-10-11 VITALS — BP 108/35

## 2023-10-11 DIAGNOSIS — C23 Malignant neoplasm of gallbladder: Secondary | ICD-10-CM

## 2023-10-11 DIAGNOSIS — Z5111 Encounter for antineoplastic chemotherapy: Secondary | ICD-10-CM | POA: Diagnosis not present

## 2023-10-11 DIAGNOSIS — K5900 Constipation, unspecified: Secondary | ICD-10-CM | POA: Diagnosis not present

## 2023-10-11 DIAGNOSIS — C787 Secondary malignant neoplasm of liver and intrahepatic bile duct: Secondary | ICD-10-CM | POA: Diagnosis not present

## 2023-10-11 DIAGNOSIS — Z5112 Encounter for antineoplastic immunotherapy: Secondary | ICD-10-CM | POA: Diagnosis not present

## 2023-10-11 DIAGNOSIS — D6481 Anemia due to antineoplastic chemotherapy: Secondary | ICD-10-CM | POA: Diagnosis not present

## 2023-10-11 DIAGNOSIS — Z95828 Presence of other vascular implants and grafts: Secondary | ICD-10-CM

## 2023-10-11 LAB — CBC WITH DIFFERENTIAL (CANCER CENTER ONLY)
Abs Immature Granulocytes: 0.04 10*3/uL (ref 0.00–0.07)
Basophils Absolute: 0.1 10*3/uL (ref 0.0–0.1)
Basophils Relative: 1 %
Eosinophils Absolute: 0.1 10*3/uL (ref 0.0–0.5)
Eosinophils Relative: 1 %
HCT: 24.6 % — ABNORMAL LOW (ref 36.0–46.0)
Hemoglobin: 8 g/dL — ABNORMAL LOW (ref 12.0–15.0)
Immature Granulocytes: 0 %
Lymphocytes Relative: 12 %
Lymphs Abs: 1.3 10*3/uL (ref 0.7–4.0)
MCH: 31.6 pg (ref 26.0–34.0)
MCHC: 32.5 g/dL (ref 30.0–36.0)
MCV: 97.2 fL (ref 80.0–100.0)
Monocytes Absolute: 1.4 10*3/uL — ABNORMAL HIGH (ref 0.1–1.0)
Monocytes Relative: 13 %
Neutro Abs: 8.1 10*3/uL — ABNORMAL HIGH (ref 1.7–7.7)
Neutrophils Relative %: 73 %
Platelet Count: 193 10*3/uL (ref 150–400)
RBC: 2.53 MIL/uL — ABNORMAL LOW (ref 3.87–5.11)
RDW: 17.6 % — ABNORMAL HIGH (ref 11.5–15.5)
WBC Count: 11.1 10*3/uL — ABNORMAL HIGH (ref 4.0–10.5)
nRBC: 0 % (ref 0.0–0.2)

## 2023-10-11 LAB — CMP (CANCER CENTER ONLY)
ALT: 11 U/L (ref 0–44)
AST: 26 U/L (ref 15–41)
Albumin: 3.5 g/dL (ref 3.5–5.0)
Alkaline Phosphatase: 83 U/L (ref 38–126)
Anion gap: 9 (ref 5–15)
BUN: 28 mg/dL — ABNORMAL HIGH (ref 8–23)
CO2: 27 mmol/L (ref 22–32)
Calcium: 9.4 mg/dL (ref 8.9–10.3)
Chloride: 101 mmol/L (ref 98–111)
Creatinine: 1.2 mg/dL — ABNORMAL HIGH (ref 0.44–1.00)
GFR, Estimated: 45 mL/min — ABNORMAL LOW (ref >60)
Glucose, Bld: 125 mg/dL — ABNORMAL HIGH (ref 70–99)
Potassium: 4 mmol/L (ref 3.5–5.1)
Sodium: 137 mmol/L (ref 135–145)
Total Bilirubin: 0.5 mg/dL (ref 0.0–1.2)
Total Protein: 6.2 g/dL — ABNORMAL LOW (ref 6.5–8.1)

## 2023-10-11 LAB — MAGNESIUM: Magnesium: 1.8 mg/dL (ref 1.7–2.4)

## 2023-10-11 LAB — TSH: TSH: 2.056 u[IU]/mL (ref 0.350–4.500)

## 2023-10-11 MED ORDER — SODIUM CHLORIDE 0.9 % IV SOLN
20.0000 mg/m2 | Freq: Once | INTRAVENOUS | Status: AC
Start: 2023-10-11 — End: 2023-10-11
  Administered 2023-10-11: 34 mg via INTRAVENOUS
  Filled 2023-10-11: qty 34

## 2023-10-11 MED ORDER — SODIUM CHLORIDE 0.9 % IV SOLN: Freq: Once | INTRAVENOUS | Status: AC

## 2023-10-11 MED ORDER — SODIUM CHLORIDE 0.9% FLUSH
10.0000 mL | INTRAVENOUS | Status: DC | PRN
  Administered 2023-10-11: 10 mL

## 2023-10-11 MED ORDER — HEPARIN SOD (PORK) LOCK FLUSH 100 UNIT/ML IV SOLN
500.0000 [IU] | Freq: Once | INTRAVENOUS | Status: AC | PRN
  Administered 2023-10-11: 500 [IU]

## 2023-10-11 MED ORDER — SODIUM CHLORIDE 0.9% FLUSH
10.0000 mL | Freq: Once | INTRAVENOUS | Status: AC
Start: 2023-10-11 — End: 2023-10-11
  Administered 2023-10-11: 10 mL

## 2023-10-11 MED ORDER — GEMCITABINE HCL CHEMO INJECTION 1 GM/26.3ML
800.0000 mg/m2 | Freq: Once | INTRAVENOUS | Status: AC
  Administered 2023-10-11: 1369 mg via INTRAVENOUS
  Filled 2023-10-11: qty 36.01

## 2023-10-11 MED ORDER — SODIUM CHLORIDE 0.9 % IV SOLN
150.0000 mg | Freq: Once | INTRAVENOUS | Status: AC
  Administered 2023-10-11: 150 mg via INTRAVENOUS
  Filled 2023-10-11: qty 150

## 2023-10-11 MED ORDER — MAGNESIUM SULFATE 4 GM/100ML IV SOLN
4.0000 g | Freq: Once | INTRAVENOUS | Status: AC
Start: 2023-10-11 — End: 2023-10-11
  Administered 2023-10-11: 4 g via INTRAVENOUS
  Filled 2023-10-11: qty 100

## 2023-10-11 MED ORDER — PALONOSETRON HCL INJECTION 0.25 MG/5ML
0.2500 mg | Freq: Once | INTRAVENOUS | Status: AC
  Administered 2023-10-11: 0.25 mg via INTRAVENOUS
  Filled 2023-10-11: qty 5

## 2023-10-11 MED ORDER — POTASSIUM CHLORIDE IN NACL 20-0.9 MEQ/L-% IV SOLN
Freq: Once | INTRAVENOUS | Status: AC
  Filled 2023-10-11: qty 1000

## 2023-10-11 NOTE — Patient Instructions (Signed)
[facility] - A DEPT OF [facility]  Discharge Instructions: Thank you for choosing [facility] to provide your oncology and hematology care.   If you have a lab appointment with the Cancer Center, please go directly to the Cancer Center and check in at the registration area.   Wear comfortable clothing and clothing appropriate for easy access to any Portacath or PICC line.   We strive to give you quality time with your provider. You may need to reschedule your appointment if you arrive late (15 or more minutes).  Arriving late affects you and other patients whose appointments are after yours.  Also, if you miss three or more appointments without notifying the office, you may be dismissed from the clinic at the provider's discretion.      For prescription refill requests, have your pharmacy contact our office and allow 72 hours for refills to be completed.    Today you received the following chemotherapy and/or immunotherapy agents: Gemcitabine, Cisplatin      To help prevent nausea and vomiting after your treatment, we encourage you to take your nausea medication as directed.  BELOW ARE SYMPTOMS THAT SHOULD BE REPORTED IMMEDIATELY: *FEVER GREATER THAN 100.4 F (38 C) OR HIGHER *CHILLS OR SWEATING *NAUSEA AND VOMITING THAT IS NOT CONTROLLED WITH YOUR NAUSEA MEDICATION *UNUSUAL SHORTNESS OF BREATH *UNUSUAL BRUISING OR BLEEDING *URINARY PROBLEMS (pain or burning when urinating, or frequent urination) *BOWEL PROBLEMS (unusual diarrhea, constipation, pain near the anus) TENDERNESS IN MOUTH AND THROAT WITH OR WITHOUT PRESENCE OF ULCERS (sore throat, sores in mouth, or a toothache) UNUSUAL RASH, SWELLING OR PAIN  UNUSUAL VAGINAL DISCHARGE OR ITCHING   Items with * indicate a potential emergency and should be followed up as soon as possible or go to the Emergency Department if any problems should occur.  Please show the CHEMOTHERAPY ALERT CARD or  IMMUNOTHERAPY ALERT CARD at check-in to the Emergency Department and triage nurse.  Should you have questions after your visit or need to cancel or reschedule your appointment, please contact [facility] - A DEPT OF [facility]  Dept: [phone]  and follow the prompts.  Office hours are 8:00 a.m. to 4:30 p.m. Monday - Friday. Please note that voicemails left after 4:00 p.m. may not be returned until the following business day.  We are closed weekends and major holidays. You have access to a nurse at all times for urgent questions. Please call the main number to the clinic Dept: [phone] and follow the prompts.   For any non-urgent questions, you may also contact your provider using MyChart. We now offer e-Visits for anyone 23 and older to request care online for non-urgent symptoms. For details visit [email or web address].   Also download the MyChart app! Go to the app store, search "MyChart", open the app, select [place], and log in with your MyChart username and password.

## 2023-10-12 LAB — CANCER ANTIGEN 19-9: CA 19-9: 557 U/mL — ABNORMAL HIGH (ref 0–35)

## 2023-10-13 LAB — T4: T4, Total: 14.8 ug/dL — ABNORMAL HIGH (ref 4.5–12.0)

## 2023-10-14 DIAGNOSIS — H47011 Ischemic optic neuropathy, right eye: Secondary | ICD-10-CM | POA: Diagnosis not present

## 2023-10-14 DIAGNOSIS — H401111 Primary open-angle glaucoma, right eye, mild stage: Secondary | ICD-10-CM | POA: Diagnosis not present

## 2023-10-14 DIAGNOSIS — E113493 Type 2 diabetes mellitus with severe nonproliferative diabetic retinopathy without macular edema, bilateral: Secondary | ICD-10-CM | POA: Diagnosis not present

## 2023-10-16 ENCOUNTER — Other Ambulatory Visit: Payer: Self-pay | Admitting: *Deleted

## 2023-10-16 NOTE — Progress Notes (Signed)
The proposed treatment discussed in conference is for discussion purpose only and is not a binding recommendation.  The patients have not been physically examined, or presented with their treatment options.  Therefore, final treatment plans cannot be decided.  

## 2023-10-22 ENCOUNTER — Other Ambulatory Visit: Payer: Self-pay | Admitting: Hematology

## 2023-10-24 ENCOUNTER — Inpatient Hospital Stay: Payer: Medicare Other

## 2023-10-24 ENCOUNTER — Other Ambulatory Visit: Payer: Self-pay

## 2023-10-24 ENCOUNTER — Encounter: Payer: Self-pay | Admitting: Hematology

## 2023-10-24 ENCOUNTER — Inpatient Hospital Stay (HOSPITAL_BASED_OUTPATIENT_CLINIC_OR_DEPARTMENT_OTHER): Payer: Medicare Other | Admitting: Hematology

## 2023-10-24 VITALS — BP 152/70 | HR 97 | Temp 97.6°F | Resp 17 | Wt 139.3 lb

## 2023-10-24 DIAGNOSIS — C23 Malignant neoplasm of gallbladder: Secondary | ICD-10-CM

## 2023-10-24 DIAGNOSIS — D6481 Anemia due to antineoplastic chemotherapy: Secondary | ICD-10-CM | POA: Diagnosis not present

## 2023-10-24 DIAGNOSIS — Z5112 Encounter for antineoplastic immunotherapy: Secondary | ICD-10-CM | POA: Diagnosis not present

## 2023-10-24 DIAGNOSIS — Z95828 Presence of other vascular implants and grafts: Secondary | ICD-10-CM

## 2023-10-24 DIAGNOSIS — Z5111 Encounter for antineoplastic chemotherapy: Secondary | ICD-10-CM | POA: Diagnosis not present

## 2023-10-24 DIAGNOSIS — K5900 Constipation, unspecified: Secondary | ICD-10-CM | POA: Diagnosis not present

## 2023-10-24 DIAGNOSIS — C787 Secondary malignant neoplasm of liver and intrahepatic bile duct: Secondary | ICD-10-CM | POA: Diagnosis not present

## 2023-10-24 LAB — CBC WITH DIFFERENTIAL (CANCER CENTER ONLY)
Abs Immature Granulocytes: 0.03 10*3/uL (ref 0.00–0.07)
Basophils Absolute: 0.1 10*3/uL (ref 0.0–0.1)
Basophils Relative: 1 %
Eosinophils Absolute: 0.1 10*3/uL (ref 0.0–0.5)
Eosinophils Relative: 1 %
HCT: 25 % — ABNORMAL LOW (ref 36.0–46.0)
Hemoglobin: 7.9 g/dL — ABNORMAL LOW (ref 12.0–15.0)
Immature Granulocytes: 0 %
Lymphocytes Relative: 12 %
Lymphs Abs: 1 10*3/uL (ref 0.7–4.0)
MCH: 31.1 pg (ref 26.0–34.0)
MCHC: 31.6 g/dL (ref 30.0–36.0)
MCV: 98.4 fL (ref 80.0–100.0)
Monocytes Absolute: 1.1 10*3/uL — ABNORMAL HIGH (ref 0.1–1.0)
Monocytes Relative: 13 %
Neutro Abs: 6.6 10*3/uL (ref 1.7–7.7)
Neutrophils Relative %: 73 %
Platelet Count: 217 10*3/uL (ref 150–400)
RBC: 2.54 MIL/uL — ABNORMAL LOW (ref 3.87–5.11)
RDW: 17.6 % — ABNORMAL HIGH (ref 11.5–15.5)
WBC Count: 8.9 10*3/uL (ref 4.0–10.5)
nRBC: 0 % (ref 0.0–0.2)

## 2023-10-24 LAB — PREPARE RBC (CROSSMATCH)

## 2023-10-24 LAB — CMP (CANCER CENTER ONLY)
ALT: 11 U/L (ref 0–44)
AST: 28 U/L (ref 15–41)
Albumin: 3.6 g/dL (ref 3.5–5.0)
Alkaline Phosphatase: 83 U/L (ref 38–126)
Anion gap: 9 (ref 5–15)
BUN: 27 mg/dL — ABNORMAL HIGH (ref 8–23)
CO2: 27 mmol/L (ref 22–32)
Calcium: 9.1 mg/dL (ref 8.9–10.3)
Chloride: 102 mmol/L (ref 98–111)
Creatinine: 1.2 mg/dL — ABNORMAL HIGH (ref 0.44–1.00)
GFR, Estimated: 45 mL/min — ABNORMAL LOW (ref >60)
Glucose, Bld: 176 mg/dL — ABNORMAL HIGH (ref 70–99)
Potassium: 4.5 mmol/L (ref 3.5–5.1)
Sodium: 138 mmol/L (ref 135–145)
Total Bilirubin: 0.5 mg/dL (ref 0.0–1.2)
Total Protein: 6.5 g/dL (ref 6.5–8.1)

## 2023-10-24 LAB — MAGNESIUM: Magnesium: 2 mg/dL (ref 1.7–2.4)

## 2023-10-24 MED ORDER — HEPARIN SOD (PORK) LOCK FLUSH 100 UNIT/ML IV SOLN
500.0000 [IU] | Freq: Once | INTRAVENOUS | Status: AC
  Administered 2023-10-24: 500 [IU]

## 2023-10-24 MED ORDER — SODIUM CHLORIDE 0.9% IV SOLUTION
250.0000 mL | INTRAVENOUS | Status: DC
  Administered 2023-10-24: 100 mL via INTRAVENOUS

## 2023-10-24 MED ORDER — SODIUM CHLORIDE 0.9% FLUSH
10.0000 mL | Freq: Once | INTRAVENOUS | Status: AC
  Administered 2023-10-24: 10 mL

## 2023-10-24 MED FILL — Fosaprepitant Dimeglumine For IV Infusion 150 MG (Base Eq): INTRAVENOUS | Qty: 5 | Status: AC

## 2023-10-24 NOTE — Progress Notes (Signed)
[facility]   Telephone:(336) [phone] Fax:([phone]   Clinic Follow up Note   Patient Care Team: Shon Hale, MD as PCP - General (Family Medicine) Runell Gess, MD as PCP - Cardiology (Cardiology) Malachy Mood, MD as Consulting Physician (Oncology)  Date of Service:  10/24/2023  CHIEF COMPLAINT: f/u of gallbladder cancer first-line chemotherapy  CURRENT THERAPY:  First-line chemotherapy cisplatin, gemcitabine and durvalumab  Oncology History   Gallbladder cancer (HCC) cT4N0M0, stage III -Patient had 1 episode of abdominal pain in October 2023, and was found to have a large gallbladder stone. -She underwent laparoscopic attempted cholecystectomy but was not able to be resected due to adhesions to duodenum and colon. -She developed jaundice in June 2024, status post ERCP and stent placement in CBD.  CT and MRI scan showed a liver mass adjacent to the gallbladder fossae, and a biopsy confirmed adenocarcinoma.  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  Unfortunately this was felt to be not resectable by Dr. Freida Busman. -I reviewed her CT chest from April 22, 2023, which showed no evidence of distant metastasis. -I recommended first-line cisplatin, gemcitabine  every 14 days and durvalumab every 28 days.  If she responded well, will stop chemo after 4 to 6 months of treatment, and changed to maintenance therapy. She started on 05/03/23 every 2 weeks and has been tolerating well.  We have also discussed option of consolidation radiation at some point. -restaging CT 07/08/2023 showed partial response in the primary site tumor with direct invasion to liver.  No new disease or question.  Will continue chemotherapy. -case reviewed in GI tumor board, consolidation RT is also an option     Assessment and Plan    Cholangiocarcinoma Follow-up for cholangiocarcinoma.  Tumor board reviewed case. Discussed potential for radiation therapy as a consolidation tool. Radiation is a local therapy option but not curative, with potential side effects including gastrointestinal issues. Patient is open to radiation therapy and immunotherapy. Plan to switch to immunotherapy maintenance therapy soon. - Complete current chemotherapy regimen by mid-March - Start immunotherapy every 28 days after mid-March - Schedule radiation therapy for 5.5 weeks, Monday through Friday, starting late March or early April - Refer to Dr. Mitzi Hansen for radiation therapy consultation in late February - Order follow-up scan at the end of March or early April before starting radiation  Anemia secondary to chemotherapy Hemoglobin level is 8, likely due to chemotherapy. Discussed iron supplementation and blood transfusion. Patient reports improved energy this week but had significant fatigue previously. Discussed starting over-the-counter prenatal multivitamin with iron, folic acid, and B12. Consider reducing gemcitabine dose for the next three cycles. Administer one unit of blood transfusion if hemoglobin remains low. - Check iron levels at next visit - Start over-the-counter prenatal multivitamin with iron, folic acid, and B12 - Administer one unit of blood transfusion if hemoglobin remains low - Reduce gemcitabine dose for the next three cycles - Draw labs today for blood transfusion - Schedule blood transfusion for tomorrow if possible  Diverticulosis Exacerbation of diverticulosis symptoms after consuming nuts. Advised to avoid nuts to prevent symptoms. - Advise to avoid nuts  Plan -Lab reviewed, adequate for treatment, she will return tomorrow for chemotherapy, and continue every 2 weeks -Will give 1 unit of blood transfusion today or tomorrow - Plan for next visit to include iron level check and potential blood transfusion.    -will  refer her to Dr. Mitzi Hansen in feb      SUMMARY OF ONCOLOGIC  HISTORY: Oncology History Overview Note   Cancer Staging  Gallbladder cancer [facility]) Staging form: Distal Bile Duct, AJCC 8th Edition - Clinical stage from 04/09/2023: Stage IIIB (cT4, cN0, cM0) - Signed by Malachy Mood, MD on 04/18/2023 Total positive nodes: 0 Histologic grade (G): G2 Histologic grading system: 3 grade system     Gallbladder cancer (HCC)  04/04/2023 Imaging    IMPRESSION: 1. Ill-defined hypodensity in the region of the gallbladder fossa appears similar to the prior study given lack of contrast. This would be better evaluated with dedicated MRI. 2. Nonobstructing right renal calculus. 3. Large amount of stool throughout the colon. 4. Stable indeterminate lucent areas in the vertebral bodies of the lumbar spine. 5. Severe atherosclerotic disease.     04/09/2023 Pathology Results     FINAL MICROSCOPIC DIAGNOSIS:   A. LIVER, ADJACENT TO GALLBLADDER FOSSA, BIOPSY:  - Moderately differentiated adenocarcinoma    04/09/2023 Cancer Staging   Staging form: Distal Bile Duct, AJCC 8th Edition - Clinical stage from 04/09/2023: Stage IIIB (cT4, cN0, cM0) - Signed by Malachy Mood, MD on 04/18/2023 Total positive nodes: 0 Histologic grade (G): G2 Histologic grading system: 3 grade system   04/18/2023 Initial Diagnosis   Cholangiocarcinoma of liver (HCC)   05/03/2023 -  Chemotherapy   Patient is on Treatment Plan : BILIARY TRACT Cisplatin + Gemcitabine D1,15 + Durvalumab (1500) D1 q28d / Durvalumab (1500) q28d     07/08/2023 Imaging   CT Chest abdomen and pelvis with contrast  IMPRESSION: CT CHEST IMPRESSION  1.  No acute process or evidence of metastatic disease in the chest. 2. Left Port-A-Cath tip in azygous vein. This is being adjusted by interventional radiology later today. 3. Ongoing stability of tiny bilateral pulmonary nodules, favored to be benign. 4.  Aortic Atherosclerosis (ICD10-I70.0).  CT ABDOMEN AND PELVIS IMPRESSION  1. The segment 5 liver lesion is decreased  in size compared to the MRI of 04/05/2023. 2. Placement of a biliary stent with improvement, near complete resolution of duct dilatation. 3. No abdominopelvic adenopathy. 4. Proximal gastric underdistention. Apparent wall thickening could be secondary. Correlate with symptoms of gastritis 5. Subtle findings within the superior spleen for which small volume interval splenic infarct cannot be excluded. Correlate with left upper quadrant symptoms. 6.  Possible constipation.     09/27/2023 Imaging   CT chest abdomen and pelvis with contrast  IMPRESSION: CHEST:  1. Stable small pulmonary nodules. 2. No evidence of thoracic metastasis.  PELVIS:  1. Stable hepatic metastasis. 2. No new hepatic lesions. 3. No evidence of metastatic adenopathy in the abdomen pelvis. 4. Intrahepatic and extrahepatic biliary stent in place. No biliary duct dilatation. 5.  Aortic Atherosclerosis (ICD10-I70.0).      Discussed the use of AI scribe software for clinical note transcription with the patient, who gave verbal consent to proceed.  History of Present Illness   The patient, an 83 year old with a history of cholangiocarcinoma, presents for a follow-up visit. She is currently undergoing chemotherapy and is being considered for radiation therapy and immunotherapy. She reports feeling generally well, with no new or worsening symptoms. She mentions having diverticulosis, which has recently caused her discomfort, leading her to avoid eating nuts. She also reports anemia, with a recent hemoglobin level of 8, which she attributes to her ongoing chemotherapy.         All other systems were reviewed with the patient and are  negative.  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 There is a small calcified mass in the L:A near the LA appendage. No valvular masses seen with associated mitral annular calcification. LA Volume/ BSA27.4 ml/m2 No AS. Right ventricular systolic pressure is elevated at .   Jaundice    Myocardial infarction [facility])    June 2021   Neuromuscular disorder (HCC)    neuropathy in bilateral feet   Peripheral vascular disease (HCC)    Pneumonia    not hosp.    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  PTCAs with drug coated balloon Lt ant tibial artery and Lt popliteal artery  11/26/2019    SURGICAL HISTORY: Past Surgical History:  Procedure Laterality Date   ABDOMINAL AORTAGRAM  11/25/2019   ABDOMINAL AORTOGRAM W/LOWER EXTREMITY    ABDOMINAL AORTOGRAM N/A 06/15/2019   Procedure: ABDOMINAL AORTOGRAM;  Surgeon: Runell Gess, MD;  [facility];  Service: Cardiovascular;  Laterality: N/A;   ABDOMINAL AORTOGRAM W/LOWER EXTREMITY N/A 11/25/2019   Procedure: ABDOMINAL AORTOGRAM W/LOWER EXTREMITY;  Surgeon: Iran Ouch, MD;  [facility]: [facility];  Service: Cardiovascular;  Laterality: N/A;  Lt leg   ABDOMINAL AORTOGRAM W/LOWER EXTREMITY N/A 08/01/2020   Procedure: ABDOMINAL AORTOGRAM W/LOWER EXTREMITY;  Surgeon: Runell Gess, MD;  [facility];  Service: Cardiovascular;  Laterality: N/A;   ABDOMINAL AORTOGRAM W/LOWER EXTREMITY N/A 04/28/2021   Procedure: ABDOMINAL AORTOGRAM W/LOWER EXTREMITY;  Surgeon: Sherren Kerns, MD;  [facility];  Service: Cardiovascular;  Laterality: N/A;   ABDOMINAL AORTOGRAM W/LOWER  EXTREMITY N/A 10/13/2021   Procedure: ABDOMINAL AORTOGRAM W/LOWER EXTREMITY;  Surgeon: Leonie Douglas, MD;  [facility];  Service: Cardiovascular;  Laterality: N/A;   ABDOMINAL AORTOGRAM W/LOWER EXTREMITY N/A 11/09/2022   Procedure: ABDOMINAL AORTOGRAM W/LOWER EXTREMITY;  Surgeon: Leonie Douglas, MD;  [facility];  Service: Cardiovascular;  Laterality: N/A;   ABDOMINAL AORTOGRAM W/LOWER EXTREMITY N/A 11/16/2022   Procedure: ABDOMINAL AORTOGRAM W/LOWER EXTREMITY;  Surgeon: Leonie Douglas, MD;  [facility];  Service: Cardiovascular;  Laterality: N/A;   AMPUTATION TOE Left 05/03/2021   Procedure: AMPUTATION OF THIRD LEFT  TOE;  Surgeon: Leonie Douglas, MD;  Location: [facility] OR;  Service: Vascular;  Laterality: Left;   APPENDECTOMY     APPLICATION OF WOUND VAC Right 07/21/2019   Procedure: Application Of Prevena Wound Vac Right Groin;  Surgeon: Nada Libman, MD;  Location: [facility] OR;  Service: Vascular;  Laterality: Right;   BILIARY BRUSHING  04/07/2023   Procedure: BILIARY BRUSHING;  Surgeon: Vida Rigger, MD;  Location: [facility] ENDOSCOPY;  Service: Gastroenterology;;   BILIARY STENT PLACEMENT  04/07/2023   Procedure: BILIARY STENT PLACEMENT;  Surgeon: Vida Rigger,  MD;  [facility];  Service: Gastroenterology;;   CARPAL TUNNEL RELEASE Left    CARPAL TUNNEL RELEASE Right    CESAREAN SECTION     x 2   CHOLECYSTECTOMY N/A 10/12/2022   Procedure: LAPAROSCOPIC PARTIAL FENESTRATING CHOLECYSTECTOMY;  Surgeon: Fritzi Mandes, MD;  [facility];  Service: General;  Laterality: N/A;   COLONOSCOPY     CORONARY ARTERY BYPASS GRAFT N/A 03/24/2020   Procedure: CORONARY ARTERY BYPASS GRAFTING (CABG) using LIMA to LAD (m); RIMA to RAMUS; Endoscopic Right Greater Saphenous Vein: SVG to Diag1; SVG to PLB (right); and SVG to PL (left).;  Surgeon: Linden Dolin, MD;  [facility];  Service: Open Heart Surgery;  Laterality: N/A;  BILATERAL IMA    ENDARTERECTOMY FEMORAL Right 07/21/2019   Procedure: RIGHT ENDARTERECTOMY FEMORAL WITH PATCH ANGIOPLASTY;  Surgeon: Nada Libman, MD;  [facility];  Service: Vascular;  Laterality: Right;   ENDARTERECTOMY FEMORAL Right 08/16/2020   Procedure: RIGHT FEMORAL ENDARTERECTOMY;  Surgeon: Maeola Harman, MD;  Location: [facility] OR;  Service: Vascular;  Laterality: Right;   ENDOVEIN HARVEST OF GREATER SAPHENOUS VEIN Right 03/24/2020   Procedure: ENDOVEIN HARVEST OF GREATER SAPHENOUS VEIN;  Surgeon: Linden Dolin, MD;  [facility];  Service: Open Heart Surgery;  Laterality: Right;   ERCP N/A 04/07/2023   Procedure: ENDOSCOPIC RETROGRADE CHOLANGIOPANCREATOGRAPHY (ERCP);  Surgeon: Vida Rigger, MD;  Location: [facility] ENDOSCOPY;  Service: Gastroenterology;  Laterality: N/A;   EYE SURGERY     cataract removal bilaterally   FEMORAL-POPLITEAL BYPASS GRAFT Right 08/16/2020   Procedure: BYPASS GRAFT FEMORAL-POPLITEAL ARTERY;  Surgeon: Maeola Harman, MD;  Location: [facility] OR;  Service: Vascular;  Laterality: Right;   FEMORAL-POPLITEAL BYPASS GRAFT Left 05/03/2021   Procedure: LEFT FEMORAL TO BELOW KNEE POPLITEAL ARTERY BYPASS GRAFTING WITH 6MMX80 PTFE GRAFT;  Surgeon: Leonie Douglas, MD;  [facility];  Service: Vascular;  Laterality: Left;   LEFT HEART CATH AND CORONARY ANGIOGRAPHY N/A 03/22/2020   Procedure: LEFT HEART CATH AND CORONARY ANGIOGRAPHY;  Surgeon: Swazil[place], Peter M, MD;  Location: [facility] INVASIVE CV LAB;  Service: Cardiovascular;  Laterality: N/A;   LOWER EXTREMITY ANGIOGRAPHY Bilateral 06/15/2019   Procedure: Lower Extremity Angiography;  Surgeon: Runell Gess, MD;  Location: [facility] INVASIVE CV LAB;  Service: Cardiovascular;  Laterality: Bilateral;   LOWER EXTREMITY ANGIOGRAPHY Right 08/03/2019   Procedure: LOWER EXTREMITY ANGIOGRAPHY;  Surgeon: Runell Gess, MD;  [facility];  Service: Cardiovascular;  Laterality: Right;   LOWER EXTREMITY ANGIOGRAPHY N/A  09/07/2021   Procedure: LOWER EXTREMITY ANGIOGRAPHY;  Surgeon: Cephus Shelling, MD;  [facility];  Service: Cardiovascular;  Laterality: N/A;   PATCH ANGIOPLASTY Right 07/21/2019   Procedure: Patch Angioplasty Right Femoral Artery;  Surgeon: Nada Libman, MD;  Location: [facility] OR;  Service: Vascular;  Laterality: Right;   PERIPHERAL INTRAVASCULAR LITHOTRIPSY  11/25/2019   Procedure: INTRAVASCULAR LITHOTRIPSY;  Surgeon: Iran Ouch, MD;  [facility];  Service: Cardiovascular;;  LT. SFA   PERIPHERAL VASCULAR ATHERECTOMY  08/03/2019   Procedure: PERIPHERAL VASCULAR ATHERECTOMY;  Surgeon: Runell Gess, MD;  Location: [facility] INVASIVE CV LAB;  Service: Cardiovascular;;  right SFA, right TP trunk   PERIPHERAL VASCULAR ATHERECTOMY  11/25/2019   Procedure: PERIPHERAL VASCULAR ATHERECTOMY;  Surgeon: Iran Ouch, MD;  [facility];  Service: Cardiovascular;;  Lt.  POPLITEAL and AT   PERIPHERAL VASCULAR BALLOON ANGIOPLASTY Left 06/15/2019  Procedure: PERIPHERAL VASCULAR BALLOON ANGIOPLASTY;  Surgeon: Runell Gess, MD;  [facility];  Service: Cardiovascular;  Laterality: Left;  SFA UNSUCCESSFUL UNABLE TO CROSS LESION   PERIPHERAL VASCULAR BALLOON ANGIOPLASTY  08/03/2019   Procedure: PERIPHERAL VASCULAR BALLOON ANGIOPLASTY;  Surgeon: Runell Gess, MD;  [facility];  Service: Cardiovascular;;  right SFA, Right TP trunk   PERIPHERAL VASCULAR BALLOON ANGIOPLASTY  09/07/2021   Procedure: PERIPHERAL VASCULAR BALLOON ANGIOPLASTY;  Surgeon: Cephus Shelling, MD;  [facility];  Service: Cardiovascular;;   PERIPHERAL VASCULAR BALLOON ANGIOPLASTY Right 10/13/2021   Procedure: PERIPHERAL VASCULAR BALLOON ANGIOPLASTY;  Surgeon: Leonie Douglas, MD;  [facility];  Service: Cardiovascular;  Laterality: Right;   PERIPHERAL VASCULAR BALLOON ANGIOPLASTY  11/09/2022   Procedure:  PERIPHERAL VASCULAR BALLOON ANGIOPLASTY;  Surgeon: Leonie Douglas, MD;  [facility];  Service: Cardiovascular;;  fem-pop bypass and AT   PORTACATH PLACEMENT Left 04/29/2023   Procedure: INSERTION PORT-A-CATH;  Surgeon: Fritzi Mandes, MD;  Location: [facility] ORS;  Service: General;  Laterality: Left;   PORTACATH PLACEMENT N/A 07/12/2023   Procedure: REVISION PORT-A-CATH;  Surgeon: Fritzi Mandes, MD;  Location: [facility] OR;  Service: General;  Laterality: N/A;   REMOVAL OF STONES  04/07/2023   Procedure: REMOVAL OF STONES;  Surgeon: Vida Rigger, MD;  Location: [facility] ENDOSCOPY;  Service: Gastroenterology;;   Dennison Mascot  04/07/2023   Procedure: Dennison Mascot;  Surgeon: Vida Rigger, MD;  Location: [facility] ENDOSCOPY;  Service: Gastroenterology;;   TEE WITHOUT CARDIOVERSION N/A 03/24/2020   Procedure: TRANSESOPHAGEAL ECHOCARDIOGRAM (TEE);  Surgeon: Linden Dolin, MD;  Location: [facility] OR;  Service: Open Heart Surgery;  Laterality: N/A;   TONSILLECTOMY     and adenoidectomy   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/2019   Procedure: TRANSMETATARSAL AMPUTATION;  Surgeon: Vivi Barrack, DPM;  Location[facility] OR;  Service: Podiatry;  Laterality: Right;   TRANSMETATARSAL AMPUTATION Left 05/11/2021   Procedure: LEFT TRANSMETATARSAL AMPUTATION;  Surgeon: Maeola Harman, MD;  Location: [facility] OR;  Service: Vascular;  Laterality: Left;   TUBAL LIGATION      I have reviewed the social history and family history with the patient and they are unchanged from previous note.  ALLERGIES:  is allergic to versed [midazolam], augmentin [amoxicillin-pot clavulanate], bactrim [sulfamethoxazole-trimethoprim], demerol [meperidine], meperidine hcl, scopolamine, and tramadol.  MEDICATIONS:  Current Outpatient Medications  Medication Sig Dispense Refill   aspirin EC 81 MG tablet Take 81 mg by mouth at bedtime. Swallow whole.     atorvastatin (LIPITOR) 80 MG tablet Take 1 tablet (80 mg total) by mouth daily.  90 tablet 3   brimonidine (ALPHAGAN) 0.2 % ophthalmic solution Place 1 drop into the right eye 2 (two) times daily.     empagliflozin (JARDIANCE) 25 MG TABS tablet Take 1 tablet (25 mg total) by mouth daily before breakfast. 90 tablet 3   hydrochlorothiazide (HYDRODIURIL) 12.5 MG tablet Take 1 tablet (12.5 mg total) by mouth daily. 90 tablet 0   Insulin Lispro Prot & Lispro (HUMALOG MIX 75/25 KWIKPEN) (75-25) 100 UNIT/ML Kwikpen INJECT 6 UNITS SUBCUTANEOUSLY ONCE DAILY WITH BREAKFAST 15 mL 4   Insulin Pen Needle (ULTRA-THIN II MINI PEN NEEDLE) 31G X 5 MM MISC Use as directed to check blood glucose 50 each 2   Lancets (ONETOUCH DELICA PLUS LANCET33G) MISC Use to check blood sugars twice daily. (Patient taking differently: Use to check blood sugars  daily.) 100 each 5  lidocaine-prilocaine (EMLA) cream Apply to affected area once (Patient taking differently: Apply 1 Application topically as needed (port).) 30 g 3   MAGNESIUM-OXIDE 400 (240 Mg) MG tablet Take 1 tablet by mouth twice daily 60 tablet 0   metFORMIN (GLUCOPHAGE) 1000 MG tablet Take 1 tablet (1,000 mg total) by mouth 2 (two) times daily. 180 tablet 2   metoprolol tartrate (LOPRESSOR) 50 MG tablet Take 1 tablet by mouth twice daily 180 tablet 0   Multiple Vitamins-Minerals (MULTI FOR HER PO) Take 1 tablet by mouth daily.     mupirocin ointment (BACTROBAN) 2 % Apply 1 Application topically 2 (two) times daily. (Patient taking differently: Apply 1 Application topically 2 (two) times daily as needed (wound care for toes).) 30 g 2   ondansetron (ZOFRAN) 8 MG tablet Take 1 tablet (8 mg total) by mouth every 8 (eight) hours as needed for nausea or vomiting. Start on the third day after cisplatin. 30 tablet 1   ONETOUCH VERIO test strip USE TO CHECK BLOOD SUGARS ONCE DAILY 100 each 2   pantoprazole (PROTONIX) 40 MG tablet Take 1 tablet by mouth once daily 90 tablet 3   prochlorperazine (COMPAZINE) 10 MG tablet Take 1 tablet (10 mg total) by mouth  every 6 (six) hours as needed (Nausea or vomiting). 30 tablet 1   Propylene Glycol (SYSTANE BALANCE) 0.6 % SOLN Place 1 drop into the left eye 3 (three) times daily as needed (dry/irritated eyes).     repaglinide (PRANDIN) 1 MG tablet Take 1 tablet (1 mg total) by mouth 2 (two) times daily before a meal. Take 1 tab by breakfast and 1 tab before dinner 180 tablet 4   rivaroxaban (XARELTO) 2.5 MG TABS tablet Take 1 tablet by mouth twice daily 60 tablet 11   telmisartan (MICARDIS) 80 MG tablet Take 1 tablet by mouth once daily 90 tablet 0   No current facility-administered medications for this visit.    PHYSICAL EXAMINATION: ECOG PERFORMANCE STATUS: 1 - Symptomatic but completely ambulatory  Vitals:   10/24/23 1014 10/24/23 1015  BP: (!) 182/60 (!) 152/70  Pulse: 97   Resp: 17   Temp: 97.6 F (36.4 C)   SpO2: 100%    Wt Readings from Last 3 Encounters:  10/24/23 139 lb 4.8 oz (63.2 kg)  10/11/23 140 lb 4.8 oz (63.6 kg)  09/27/23 141 lb 12.8 oz (64.3 kg)     GENERAL:alert, no distress and comfortable SKIN: skin color, texture, turgor are normal, no rashes or significant lesions EYES: normal, Conjunctiva are pink and non-injected, sclera clear NECK: supple, thyroid normal size, non-tender, without nodularity LYMPH:  no palpable lymphadenopathy in the cervical, axillary  LUNGS: clear to auscultation and percussion with normal breathing effort HEART: regular rate & rhythm and no murmurs and no lower extremity edema ABDOMEN:abdomen soft, non-tender and normal bowel sounds Musculoskeletal:no cyanosis of digits and no clubbing  NEURO: alert & oriented x 3 with fluent speech, no focal motor/sensory deficits    LABORATORY DATA:  I have reviewed the data as listed    Latest Ref Rng & Units 10/24/2023    9:49 AM 10/11/2023    7:46 AM 09/27/2023    8:22 AM  CBC  WBC 4.0 - 10.5 K/uL 8.9  11.1  8.2   Hemoglobin 12.0 - 15.0 g/dL 7.9  8.0  8.5   Hematocrit 36.0 - 46.0 % 25.0  24.6  25.8    Platelets 150 - 400 K/uL 217  193  162  Latest Ref Rng & Units 10/24/2023    9:49 AM 10/11/2023    7:46 AM 09/27/2023    8:22 AM  CMP  Glucose 70 - 99 mg/dL 161  096  045   BUN 8 - 23 mg/dL 27  28  25    Creatinine 0.44 - 1.00 mg/dL 4.09  8.11  9.14   Sodium 135 - 145 mmol/L 138  137  138   Potassium 3.5 - 5.1 mmol/L 4.5  4.0  4.1   Chloride 98 - 111 mmol/L 102  101  103   CO2 22 - 32 mmol/L 27  27  26    Calcium 8.9 - 10.3 mg/dL 9.1  9.4  9.2   Total Protein 6.5 - 8.1 g/dL 6.5  6.2  6.4   Total Bilirubin 0.0 - 1.2 mg/dL 0.5  0.5  0.4   Alkaline Phos 38 - 126 U/L 83  83  94   AST 15 - 41 U/L 28  26  31    ALT 0 - 44 U/L 11  11  14        RADIOGRAPHIC STUDIES: I have personally reviewed the radiological images as listed and agreed with the findings in the report. No results found.    Orders Placed This Encounter  Procedures   Informed Blood Refusal: Physician/Practitioner Attestation; Transcribe to Blood Refusal form and obtain patient signature    Standing Status:   Standing    Number of Occurrences:   1    Product(s):   Patient request only albumin or albumin containing products    Physician/Practitioner attestation of informed refusal for blood and or blood products:   I, the physician/practitioner, attest that I have discussed with the patient the risks and possible consequences of refusal of blood or blood products.   All questions were answered. The patient knows to call the clinic with any problems, questions or concerns. No barriers to learning was detected. The total time spent in the appointment was 30 minutes.     Malachy Mood, MD 10/24/2023

## 2023-10-24 NOTE — Assessment & Plan Note (Signed)
cT4N0M0, stage III -Patient had 1 episode of abdominal pain in October 2023, and was found to have a large gallbladder stone. -She underwent laparoscopic attempted cholecystectomy but was not able to be resected due to adhesions to duodenum and colon. -She developed jaundice in June 2024, status post ERCP and stent placement in [place].  CT and MRI scan showed a liver mass adjacent to the gallbladder fossae, and a biopsy confirmed adenocarcinoma.  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  Unfortunately this was felt to be not resectable by Dr. Freida Busman. -I reviewed her CT chest from April 22, 2023, which showed no evidence of distant metastasis. -I recommended first-line cisplatin, gemcitabine  every 14 days and durvalumab every 28 days.  If she responded well, will stop chemo after 4 to 6 months of treatment, and changed to maintenance therapy. She started on 05/03/23 every 2 weeks and has been tolerating well.  We have also discussed option of consolidation radiation at some point. -restaging CT 07/08/2023 showed partial response in the primary site tumor with direct invasion to liver.  No new disease or question.  Will continue chemotherapy. -case reviewed in GI tumor board, consolidation RT is also an option

## 2023-10-24 NOTE — Progress Notes (Signed)
Verbal order given to administer 1 unit of PRBC from Dr.Feng, spoke to [place] in blood bank type and screen order received patient will have 1 unit of PRBC transfused on 10/24/2023

## 2023-10-25 ENCOUNTER — Inpatient Hospital Stay: Payer: Medicare Other

## 2023-10-25 VITALS — BP 157/54 | HR 76 | Temp 98.5°F | Resp 16

## 2023-10-25 DIAGNOSIS — C787 Secondary malignant neoplasm of liver and intrahepatic bile duct: Secondary | ICD-10-CM | POA: Diagnosis not present

## 2023-10-25 DIAGNOSIS — D6481 Anemia due to antineoplastic chemotherapy: Secondary | ICD-10-CM | POA: Diagnosis not present

## 2023-10-25 DIAGNOSIS — K5900 Constipation, unspecified: Secondary | ICD-10-CM | POA: Diagnosis not present

## 2023-10-25 DIAGNOSIS — C23 Malignant neoplasm of gallbladder: Secondary | ICD-10-CM | POA: Diagnosis not present

## 2023-10-25 DIAGNOSIS — Z5111 Encounter for antineoplastic chemotherapy: Secondary | ICD-10-CM | POA: Diagnosis not present

## 2023-10-25 DIAGNOSIS — Z5112 Encounter for antineoplastic immunotherapy: Secondary | ICD-10-CM | POA: Diagnosis not present

## 2023-10-25 LAB — BPAM RBC
Blood Product Expiration Date: 202503022359
ISSUE DATE / TIME: 202501301231
Unit Type and Rh: 5100

## 2023-10-25 LAB — TYPE AND SCREEN
ABO/RH(D): O POS
Antibody Screen: NEGATIVE
Unit division: 0

## 2023-10-25 MED ORDER — SODIUM CHLORIDE 0.9% FLUSH
10.0000 mL | INTRAVENOUS | Status: DC | PRN
  Administered 2023-10-25: 10 mL

## 2023-10-25 MED ORDER — SODIUM CHLORIDE 0.9 % IV SOLN
150.0000 mg | Freq: Once | INTRAVENOUS | Status: AC
  Administered 2023-10-25: 150 mg via INTRAVENOUS
  Filled 2023-10-25: qty 150

## 2023-10-25 MED ORDER — SODIUM CHLORIDE 0.9 % IV SOLN
600.0000 mg/m2 | Freq: Once | INTRAVENOUS | Status: AC
  Administered 2023-10-25: 1027 mg via INTRAVENOUS
  Filled 2023-10-25: qty 27.01

## 2023-10-25 MED ORDER — HEPARIN SOD (PORK) LOCK FLUSH 100 UNIT/ML IV SOLN
500.0000 [IU] | Freq: Once | INTRAVENOUS | Status: AC | PRN
  Administered 2023-10-25: 500 [IU]

## 2023-10-25 MED ORDER — MAGNESIUM SULFATE 4 GM/100ML IV SOLN
4.0000 g | Freq: Once | INTRAVENOUS | Status: AC
  Administered 2023-10-25: 4 g via INTRAVENOUS
  Filled 2023-10-25: qty 100

## 2023-10-25 MED ORDER — POTASSIUM CHLORIDE IN NACL 20-0.9 MEQ/L-% IV SOLN
Freq: Once | INTRAVENOUS | Status: AC
  Filled 2023-10-25: qty 1000

## 2023-10-25 MED ORDER — SODIUM CHLORIDE 0.9 % IV SOLN: Freq: Once | INTRAVENOUS | Status: AC

## 2023-10-25 MED ORDER — SODIUM CHLORIDE 0.9 % IV SOLN
20.0000 mg/m2 | Freq: Once | INTRAVENOUS | Status: AC
  Administered 2023-10-25: 34 mg via INTRAVENOUS
  Filled 2023-10-25: qty 34

## 2023-10-25 MED ORDER — PALONOSETRON HCL INJECTION 0.25 MG/5ML
0.2500 mg | Freq: Once | INTRAVENOUS | Status: AC
  Administered 2023-10-25: 0.25 mg via INTRAVENOUS
  Filled 2023-10-25: qty 5

## 2023-10-25 NOTE — Patient Instructions (Signed)
 [facility] - A DEPT OF [facility]  Discharge Instructions: Thank you for choosing [facility] to provide your oncology and hematology care.   If you have a lab appointment with the Cancer Center, please go directly to the Cancer Center and check in at the registration area.   Wear comfortable clothing and clothing appropriate for easy access to any Portacath or PICC line.   We strive to give you quality time with your provider. You may need to reschedule your appointment if you arrive late (15 or more minutes).  Arriving late affects you and other patients whose appointments are after yours.  Also, if you miss three or more appointments without notifying the office, you may be dismissed from the clinic at the provider's discretion.      For prescription refill requests, have your pharmacy contact our office and allow 72 hours for refills to be completed.    Today you received the following chemotherapy and/or immunotherapy agents gemzar, cisplatin      To help prevent nausea and vomiting after your treatment, we encourage you to take your nausea medication as directed.  BELOW ARE SYMPTOMS THAT SHOULD BE REPORTED IMMEDIATELY: *FEVER GREATER THAN 100.4 F (38 C) OR HIGHER *CHILLS OR SWEATING *NAUSEA AND VOMITING THAT IS NOT CONTROLLED WITH YOUR NAUSEA MEDICATION *UNUSUAL SHORTNESS OF BREATH *UNUSUAL BRUISING OR BLEEDING *URINARY PROBLEMS (pain or burning when urinating, or frequent urination) *BOWEL PROBLEMS (unusual diarrhea, constipation, pain near the anus) TENDERNESS IN MOUTH AND THROAT WITH OR WITHOUT PRESENCE OF ULCERS (sore throat, sores in mouth, or a toothache) UNUSUAL RASH, SWELLING OR PAIN  UNUSUAL VAGINAL DISCHARGE OR ITCHING   Items with * indicate a potential emergency and should be followed up as soon as possible or go to the Emergency Department if any problems should occur.  Please show the CHEMOTHERAPY ALERT CARD or  IMMUNOTHERAPY ALERT CARD at check-in to the Emergency Department and triage nurse.  Should you have questions after your visit or need to cancel or reschedule your appointment, please contact [facility] - [facility]  Dept: [phone]  and follow the prompts.  Office hours are 8:00 a.m. to 4:30 p.m. Monday - Friday. Please note that voicemails left after 4:00 p.m. may not be returned until the following business day.  We are closed weekends and major holidays. You have access to a nurse at all times for urgent questions. Please call the main number to the clinic Dept: [phone] and follow the prompts.   For any non-urgent questions, you may also contact your provider using MyChart. We now offer e-Visits for anyone 51 and older to request care online for non-urgent symptoms. For details visit [email or web address].   Also download the MyChart app! Go to the app store, search "MyChart", open the app, select [facility], and log in with your MyChart username and password.

## 2023-10-25 NOTE — Progress Notes (Signed)
Per Dr. Mosetta Putt, ok to treat with Hgb 7.9 from labs on 10/24/23. Pt received 1 Unit PRBC's on 10/24/23 prior to chemo infusion on 10/25/2023.

## 2023-10-31 ENCOUNTER — Encounter: Payer: Self-pay | Admitting: Hematology

## 2023-10-31 ENCOUNTER — Encounter: Payer: Self-pay | Admitting: Podiatry

## 2023-10-31 ENCOUNTER — Ambulatory Visit (INDEPENDENT_AMBULATORY_CARE_PROVIDER_SITE_OTHER): Payer: Medicare Other | Admitting: Podiatry

## 2023-10-31 DIAGNOSIS — L84 Corns and callosities: Secondary | ICD-10-CM

## 2023-10-31 DIAGNOSIS — E1142 Type 2 diabetes mellitus with diabetic polyneuropathy: Secondary | ICD-10-CM

## 2023-10-31 NOTE — Progress Notes (Signed)
 Subjective: Chief Complaint  Patient presents with   Callouses    RM#13 pre-ulcerative calluses both feet patient has no other concers at this time.    83 year old female presents the office today for follow-up evaluation of preulcerative calluses to both of her feet.  She does not see any open lesions or any drainage or any bleeding. No new concerns to her feet today.   Endocrinologist: Dr. Sudan Thapa, MD Last A1c 09/23/2023 was 6.6  Objective: AAO x3, NAD Status post bilateral transmetatarsal amputations.  Hyperkeratotic lesions noted along the medial aspects of bilateral foot as well as lateral aspect of the right foot on the incision distally.  There is no ongoing ulceration, drainage or signs of infection.  No new open lesions identified.  There is no significant dried blood noted today or any active bleeding or signs of infection. No pain with calf compression, swelling, warmth, erythema   Assessment: Preulcerative calluses bilateral feet  Plan: -All treatment options discussed with the patient including all alternatives, risks, complications.  -Sharply debrided the hyperkeratotic lesions x 3 without any complications or bleeding.  Continue offloading. -Monitor for any clinical signs or symptoms of infection and directed to call the office immediately should any occur or go to the ER.  Return in about 3 months (around 01/28/2024) for pre-ulcerative calluses both feet.  Donnice JONELLE Fees DPM

## 2023-11-01 ENCOUNTER — Other Ambulatory Visit: Payer: Self-pay

## 2023-11-01 ENCOUNTER — Telehealth: Payer: Self-pay | Admitting: Hematology

## 2023-11-07 ENCOUNTER — Inpatient Hospital Stay: Payer: Medicare Other | Attending: Hematology

## 2023-11-07 ENCOUNTER — Encounter: Payer: Self-pay | Admitting: Hematology

## 2023-11-07 ENCOUNTER — Other Ambulatory Visit: Payer: Medicare Other

## 2023-11-07 ENCOUNTER — Ambulatory Visit: Payer: Medicare Other | Admitting: Hematology

## 2023-11-07 ENCOUNTER — Inpatient Hospital Stay (HOSPITAL_BASED_OUTPATIENT_CLINIC_OR_DEPARTMENT_OTHER): Payer: Medicare Other | Admitting: Hematology

## 2023-11-07 ENCOUNTER — Other Ambulatory Visit: Payer: Self-pay | Admitting: Hematology

## 2023-11-07 VITALS — BP 160/62 | HR 84 | Temp 97.8°F | Resp 18 | Wt 144.3 lb

## 2023-11-07 DIAGNOSIS — Z5112 Encounter for antineoplastic immunotherapy: Secondary | ICD-10-CM | POA: Insufficient documentation

## 2023-11-07 DIAGNOSIS — Z452 Encounter for adjustment and management of vascular access device: Secondary | ICD-10-CM | POA: Diagnosis not present

## 2023-11-07 DIAGNOSIS — C23 Malignant neoplasm of gallbladder: Secondary | ICD-10-CM | POA: Diagnosis not present

## 2023-11-07 DIAGNOSIS — D6481 Anemia due to antineoplastic chemotherapy: Secondary | ICD-10-CM

## 2023-11-07 DIAGNOSIS — C787 Secondary malignant neoplasm of liver and intrahepatic bile duct: Secondary | ICD-10-CM | POA: Diagnosis not present

## 2023-11-07 DIAGNOSIS — Z5111 Encounter for antineoplastic chemotherapy: Secondary | ICD-10-CM | POA: Diagnosis not present

## 2023-11-07 DIAGNOSIS — Z95828 Presence of other vascular implants and grafts: Secondary | ICD-10-CM

## 2023-11-07 DIAGNOSIS — Z79620 Long term (current) use of immunosuppressive biologic: Secondary | ICD-10-CM | POA: Insufficient documentation

## 2023-11-07 LAB — CBC WITH DIFFERENTIAL (CANCER CENTER ONLY)
Abs Immature Granulocytes: 0.02 10*3/uL (ref 0.00–0.07)
Basophils Absolute: 0 10*3/uL (ref 0.0–0.1)
Basophils Relative: 1 %
Eosinophils Absolute: 0.1 10*3/uL (ref 0.0–0.5)
Eosinophils Relative: 2 %
HCT: 27.9 % — ABNORMAL LOW (ref 36.0–46.0)
Hemoglobin: 8.7 g/dL — ABNORMAL LOW (ref 12.0–15.0)
Immature Granulocytes: 0 %
Lymphocytes Relative: 14 %
Lymphs Abs: 1 10*3/uL (ref 0.7–4.0)
MCH: 31.4 pg (ref 26.0–34.0)
MCHC: 31.2 g/dL (ref 30.0–36.0)
MCV: 100.7 fL — ABNORMAL HIGH (ref 80.0–100.0)
Monocytes Absolute: 0.9 10*3/uL (ref 0.1–1.0)
Monocytes Relative: 13 %
Neutro Abs: 4.8 10*3/uL (ref 1.7–7.7)
Neutrophils Relative %: 70 %
Platelet Count: 141 10*3/uL — ABNORMAL LOW (ref 150–400)
RBC: 2.77 MIL/uL — ABNORMAL LOW (ref 3.87–5.11)
RDW: 18.6 % — ABNORMAL HIGH (ref 11.5–15.5)
WBC Count: 6.9 10*3/uL (ref 4.0–10.5)
nRBC: 0 % (ref 0.0–0.2)

## 2023-11-07 LAB — CMP (CANCER CENTER ONLY)
ALT: 15 U/L (ref 0–44)
AST: 28 U/L (ref 15–41)
Albumin: 3.4 g/dL — ABNORMAL LOW (ref 3.5–5.0)
Alkaline Phosphatase: 105 U/L (ref 38–126)
Anion gap: 6 (ref 5–15)
BUN: 25 mg/dL — ABNORMAL HIGH (ref 8–23)
CO2: 29 mmol/L (ref 22–32)
Calcium: 9.2 mg/dL (ref 8.9–10.3)
Chloride: 104 mmol/L (ref 98–111)
Creatinine: 1.12 mg/dL — ABNORMAL HIGH (ref 0.44–1.00)
GFR, Estimated: 49 mL/min — ABNORMAL LOW (ref >60)
Glucose, Bld: 152 mg/dL — ABNORMAL HIGH (ref 70–99)
Potassium: 4.4 mmol/L (ref 3.5–5.1)
Sodium: 139 mmol/L (ref 135–145)
Total Bilirubin: 0.7 mg/dL (ref 0.0–1.2)
Total Protein: 6.2 g/dL — ABNORMAL LOW (ref 6.5–8.1)

## 2023-11-07 LAB — SAMPLE TO BLOOD BANK

## 2023-11-07 LAB — MAGNESIUM: Magnesium: 1.8 mg/dL (ref 1.7–2.4)

## 2023-11-07 LAB — TSH: TSH: 2.061 u[IU]/mL (ref 0.350–4.500)

## 2023-11-07 MED ORDER — SODIUM CHLORIDE 0.9% FLUSH
10.0000 mL | Freq: Once | INTRAVENOUS | Status: AC
Start: 2023-11-07 — End: 2023-11-07
  Administered 2023-11-07: 10 mL

## 2023-11-07 MED ORDER — HEPARIN SOD (PORK) LOCK FLUSH 100 UNIT/ML IV SOLN
500.0000 [IU] | Freq: Once | INTRAVENOUS | Status: AC
  Administered 2023-11-07: 500 [IU]

## 2023-11-07 MED FILL — Fosaprepitant Dimeglumine For IV Infusion 150 MG (Base Eq): INTRAVENOUS | Qty: 5 | Status: AC

## 2023-11-07 NOTE — Progress Notes (Signed)
[facility]   Telephone:([phone] Fax:([phone]) [phone]   Clinic Follow up Note   Patient Care Team: Shon Hale, MD as PCP - General (Family Medicine) Runell Gess, MD as PCP - Cardiology (Cardiology) Malachy Mood, MD as Consulting Physician (Oncology)  Date of Service:  11/07/2023  CHIEF COMPLAINT: f/u of gallbladder cancer  CURRENT THERAPY:  First-line chemotherapy cisplatin, gemcitabine and durvalumab  Oncology History   Gallbladder cancer (HCC) cT4N0M0, stage III -Patient had 1 episode of abdominal pain in October 2023, and was found to have a large gallbladder stone. -She underwent laparoscopic attempted cholecystectomy but was not able to be resected due to adhesions to duodenum and colon. -She developed jaundice in June 2024, status post ERCP and stent placement in CBD.  CT and MRI scan showed a liver mass adjacent to the gallbladder fossae, and a biopsy confirmed adenocarcinoma.  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  Unfortunately this was felt to be not resectable by Dr. Freida Busman. -I reviewed her CT chest from April 22, 2023, which showed no evidence of distant metastasis. -I recommended first-line cisplatin, gemcitabine  every 14 days and durvalumab every 28 days.  If she responded well, will stop chemo after 4 to 6 months of treatment, and changed to maintenance therapy. She started on 05/03/23 every 2 weeks and has been tolerating well.  We have also discussed option of consolidation radiation at some point. -restaging CT 07/08/2023 showed partial response in the primary site tumor with direct invasion to liver.  No new disease or question.  Will continue chemotherapy. -case reviewed in GI tumor board, consolidation RT is also an option     Assessment and Plan    Gallbladder Cancer 83 year old female with gallbladder cancer, currently  undergoing chemotherapy. Reports significant bloating and fluid retention following recent treatments, particularly the last two cycles. Creatinine levels at 1.2, indicating mild renal impairment. Hemoglobin improved to 8.7 following a recent blood transfusion. Tumor markers dropped from 90,000 to 500, indicating a positive response to treatment. Discussed switching to maintenance immunotherapy after cycle 8, which is easier to tolerate with once every four weeks administration. Patient prefers to continue with the current chemotherapy regimen for now, understanding the potential benefits of completing the last three cycles. Discussed the option of radiation therapy post-chemotherapy to provide a break from chemo and potentially improve outcomes. - Administer cycle 7, day 15 chemotherapy today - Switch to maintenance immunotherapy every four weeks after cycle 8 - Refer to Dr. Mitzi Hansen for consideration of radiation therapy - Administer IV Lasix to manage fluid retention - Monitor kidney function and ensure adequate hydration to prevent dehydration - Schedule follow-up in two weeks for immunotherapy  Fluid Retention Experiences significant fluid retention and bloating post-chemotherapy, taking up to a week to resolve. Currently on hydrochlorothiazide, which may not be sufficient for managing the fluid overload. Discussed the use of IV Lasix to help manage fluid retention, with the understanding that it may cause increased urination and potential dehydration, which can be mitigated by maintaining adequate hydration. - Administer IV Lasix to manage fluid retention - Monitor kidney function and ensure adequate hydration to prevent dehydration  Anemia Hemoglobin level improved to 8.7 following a recent blood transfusion. Reports feeling better and was able to attend her grandson's wedding, indicating improved quality of life. - Monitor hemoglobin levels regularly - Consider further blood transfusions if  hemoglobin drops  significantly  Plan -lab reviewed, will proceed [id number] cehmo tomorrow with lasix  - Schedule follow-up in two weeks for immunotherapy Durvalumab maintenance  - Refer to Dr. Mitzi Hansen for radiation therapy consultation - Ensure lab results are reviewed and discussed with the patient.         SUMMARY OF ONCOLOGIC HISTORY: Oncology History Overview Note   Cancer Staging  Gallbladder cancer (HCC) Staging form: Distal Bile Duct, AJCC 8th Edition - Clinical stage from 04/09/2023: Stage IIIB (cT4, cN0, cM0) - Signed by Malachy Mood, MD on 04/18/2023 Total positive nodes: 0 Histologic grade (G): G2 Histologic grading system: 3 grade system     Gallbladder cancer (HCC)  04/04/2023 Imaging    IMPRESSION: 1. Ill-defined hypodensity in the region of the gallbladder fossa appears similar to the prior study given lack of contrast. This would be better evaluated with dedicated MRI. 2. Nonobstructing right renal calculus. 3. Large amount of stool throughout the colon. 4. Stable indeterminate lucent areas in the vertebral bodies of the lumbar spine. 5. Severe atherosclerotic disease.     04/09/2023 Pathology Results     FINAL MICROSCOPIC DIAGNOSIS:   A. LIVER, ADJACENT TO GALLBLADDER FOSSA, BIOPSY:  - Moderately differentiated adenocarcinoma    04/09/2023 Cancer Staging   Staging form: Distal Bile Duct, AJCC 8th Edition - Clinical stage from 04/09/2023: Stage IIIB (cT4, cN0, cM0) - Signed by Malachy Mood, MD on 04/18/2023 Total positive nodes: 0 Histologic grade (G): G2 Histologic grading system: 3 grade system   04/18/2023 Initial Diagnosis   Cholangiocarcinoma of liver (HCC)   05/03/2023 -  Chemotherapy   Patient is on Treatment Plan : BILIARY TRACT Cisplatin + Gemcitabine D1,15 + Durvalumab (1500) D1 q28d / Durvalumab (1500) q28d     07/08/2023 Imaging   CT Chest abdomen and pelvis with contrast  IMPRESSION: CT CHEST IMPRESSION  1.  No acute process or evidence of  metastatic disease in the chest. 2. Left Port-A-Cath tip in azygous vein. This is being adjusted by interventional radiology later today. 3. Ongoing stability of tiny bilateral pulmonary nodules, favored to be benign. 4.  Aortic Atherosclerosis (ICD10-I70.0).  CT ABDOMEN AND PELVIS IMPRESSION  1. The segment 5 liver lesion is decreased in size compared to the MRI of 04/05/2023. 2. Placement of a biliary stent with improvement, near complete resolution of duct dilatation. 3. No abdominopelvic adenopathy. 4. Proximal gastric underdistention. Apparent wall thickening could be secondary. Correlate with symptoms of gastritis 5. Subtle findings within the superior spleen for which small volume interval splenic infarct cannot be excluded. Correlate with left upper quadrant symptoms. 6.  Possible constipation.     09/27/2023 Imaging   CT chest abdomen and pelvis with contrast  IMPRESSION: CHEST:  1. Stable small pulmonary nodules. 2. No evidence of thoracic metastasis.  PELVIS:  1. Stable hepatic metastasis. 2. No new hepatic lesions. 3. No evidence of metastatic adenopathy in the abdomen pelvis. 4. Intrahepatic and extrahepatic biliary stent in place. No biliary duct dilatation. 5.  Aortic Atherosclerosis (ICD10-I70.0).      Discussed the use of AI scribe software for clinical note transcription with the patient, who gave verbal consent to proceed.  History of Present Illness   Miss Homero Fellers, an 83 year old with a history of gallbladder cancer and anemia, presents for a follow-up visit. She has been undergoing chemotherapy and expresses concerns about the side effects, particularly fluid retention, which causes bloating and discomfort. This issue has been particularly pronounced after her last two treatments, taking a week for  her to eliminate the excess fluid. She is also worried about potential kidney damage due to the fluid retention. Her kidney function has been mildly decreased, with a  creatinine level of 1.2, which has been consistent over the past few months.  Despite feeling unwell, she is considering continuing with her last three chemotherapy sessions, believing they might be beneficial for her condition. She also mentions that her anemia has improved following a recent blood transfusion, which helped her feel better during her grandson's wedding. She is also considering radiation therapy as a potential treatment option after her chemotherapy.         All other systems were reviewed with the patient and are negative.  MEDICAL HISTORY:  Past Medical History:  Diagnosis Date   Anemia    after CABG in june 2021   Arthritis    Cancer 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 There is a small calcified mass in the L:A near the LA appendage. No valvular masses seen with associated mitral annular calcification. LA Volume/ BSA27.4 ml/m2 No AS. Right ventricular systolic pressure is elevated at .   Jaundice    Myocardial infarction [facility])    June 2021   Neuromuscular disorder (HCC)    neuropathy in bilateral feet   Peripheral vascular disease (HCC)    Pneumonia    not hosp.    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  PTCAs with drug coated balloon Lt ant tibial artery and Lt popliteal artery  11/26/2019    SURGICAL HISTORY: Past Surgical History:  Procedure Laterality Date   ABDOMINAL AORTAGRAM  11/25/2019   ABDOMINAL AORTOGRAM W/LOWER EXTREMITY    ABDOMINAL AORTOGRAM N/A  06/15/2019   Procedure: ABDOMINAL AORTOGRAM;  Surgeon: Runell Gess, MD;  [facility];  Service: Cardiovascular;  Laterality: N/A;   ABDOMINAL AORTOGRAM W/LOWER EXTREMITY N/A 11/25/2019   Procedure: ABDOMINAL AORTOGRAM W/LOWER EXTREMITY;  Surgeon: Iran Ouch, MD;  Location: [facility];  Service: Cardiovascular;  Laterality: N/A;  Lt leg   ABDOMINAL AORTOGRAM W/LOWER EXTREMITY N/A 08/01/2020   Procedure: ABDOMINAL AORTOGRAM W/LOWER EXTREMITY;  Surgeon: Runell Gess, MD;  [facility]: [facility];  Service: Cardiovascular;  Laterality: N/A;   ABDOMINAL AORTOGRAM W/LOWER EXTREMITY N/A 04/28/2021   Procedure: ABDOMINAL AORTOGRAM W/LOWER EXTREMITY;  Surgeon: Sherren Kerns, MD;  [facility];  Service: Cardiovascular;  Laterality: N/A;   ABDOMINAL AORTOGRAM W/LOWER EXTREMITY N/A 10/13/2021   Procedure: ABDOMINAL AORTOGRAM W/LOWER EXTREMITY;  Surgeon: Leonie Douglas, MD;  [facility];  Service: Cardiovascular;  Laterality: N/A;   ABDOMINAL AORTOGRAM W/LOWER EXTREMITY N/A 11/09/2022   Procedure: ABDOMINAL AORTOGRAM W/LOWER EXTREMITY;  Surgeon: Leonie Douglas, MD;  [facility];  Service: Cardiovascular;  Laterality: N/A;   ABDOMINAL AORTOGRAM W/LOWER EXTREMITY N/A 11/16/2022   Procedure: ABDOMINAL AORTOGRAM W/LOWER EXTREMITY;  Surgeon: Leonie Douglas, MD;  Location: [facility] INVASIVE CV  LAB;  Service: Cardiovascular;  Laterality: N/A;   AMPUTATION TOE Left 05/03/2021   Procedure: AMPUTATION OF THIRD LEFT  TOE;  Surgeon: Leonie Douglas, MD;  Location: [facility] OR;  Service: Vascular;  Laterality: Left;   APPENDECTOMY     APPLICATION OF WOUND VAC Right 07/21/2019   Procedure: Application Of Prevena Wound Vac Right Groin;  Surgeon: Nada Libman, MD;  Location: [facility] OR;  Service: Vascular;  Laterality: Right;   BILIARY BRUSHING  04/07/2023   Procedure: BILIARY BRUSHING;  Surgeon: Vida Rigger, MD;  Location: [facility]  ENDOSCOPY;  Service: Gastroenterology;;   BILIARY STENT PLACEMENT  04/07/2023   Procedure: BILIARY STENT PLACEMENT;  Surgeon: Vida Rigger, MD;  Location: [facility] ENDOSCOPY;  Service: Gastroenterology;;   CARPAL TUNNEL RELEASE Left    CARPAL TUNNEL RELEASE Right    CESAREAN SECTION     x 2   CHOLECYSTECTOMY N/A 10/12/2022   Procedure: LAPAROSCOPIC PARTIAL FENESTRATING CHOLECYSTECTOMY;  Surgeon: Fritzi Mandes, MD;  [facility];  Service: General;  Laterality: N/A;   COLONOSCOPY     CORONARY ARTERY BYPASS GRAFT N/A 03/24/2020   Procedure: CORONARY ARTERY BYPASS GRAFTING (CABG) using LIMA to LAD (m); RIMA to RAMUS; Endoscopic Right Greater Saphenous Vein: SVG to Diag1; SVG to PLB (right); and SVG to PL (left).;  Surgeon: Linden Dolin, MD;  Location: [facility] OR;  Service: Open Heart Surgery;  Laterality: N/A;  BILATERAL IMA   ENDARTERECTOMY FEMORAL Right 07/21/2019   Procedure: RIGHT ENDARTERECTOMY FEMORAL WITH PATCH ANGIOPLASTY;  Surgeon: Nada Libman, MD;  [facility];  Service: Vascular;  Laterality: Right;   ENDARTERECTOMY FEMORAL Right 08/16/2020   Procedure: RIGHT FEMORAL ENDARTERECTOMY;  Surgeon: Maeola Harman, MD;  Location: [facility] OR;  Service: Vascular;  Laterality: Right;   ENDOVEIN HARVEST OF GREATER SAPHENOUS VEIN Right 03/24/2020   Procedure: ENDOVEIN HARVEST OF GREATER SAPHENOUS VEIN;  Surgeon: Linden Dolin, MD;  [facility];  Service: Open Heart Surgery;  Laterality: Right;   ERCP N/A 04/07/2023   Procedure: ENDOSCOPIC RETROGRADE CHOLANGIOPANCREATOGRAPHY (ERCP);  Surgeon: Vida Rigger, MD;  Location: [facility] ENDOSCOPY;  Service: Gastroenterology;  Laterality: N/A;   EYE SURGERY     cataract removal bilaterally   FEMORAL-POPLITEAL BYPASS GRAFT Right 08/16/2020   Procedure: BYPASS GRAFT FEMORAL-POPLITEAL ARTERY;  Surgeon: Maeola Harman, MD;  Location: [facility] OR;  Service: Vascular;  Laterality: Right;   FEMORAL-POPLITEAL BYPASS GRAFT Left 05/03/2021    Procedure: LEFT FEMORAL TO BELOW KNEE POPLITEAL ARTERY BYPASS GRAFTING WITH 6MMX80 PTFE GRAFT;  Surgeon: Leonie Douglas, MD;  [facility];  Service: Vascular;  Laterality: Left;   LEFT HEART CATH AND CORONARY ANGIOGRAPHY N/A 03/22/2020   Procedure: LEFT HEART CATH AND CORONARY ANGIOGRAPHY;  Surgeon: Swaziland, Peter M, MD;  Location: [facility] INVASIVE CV LAB;  Service: Cardiovascular;  Laterality: N/A;   LOWER EXTREMITY ANGIOGRAPHY Bilateral 06/15/2019   Procedure: Lower Extremity Angiography;  Surgeon: Runell Gess, MD;  Location: [facility] INVASIVE CV LAB;  Service: Cardiovascular;  Laterality: Bilateral;   LOWER EXTREMITY ANGIOGRAPHY Right 08/03/2019   Procedure: LOWER EXTREMITY ANGIOGRAPHY;  Surgeon: Runell Gess, MD;  [facility];  Service: Cardiovascular;  Laterality: Right;   LOWER EXTREMITY ANGIOGRAPHY N/A 09/07/2021   Procedure: LOWER EXTREMITY ANGIOGRAPHY;  Surgeon: Cephus Shelling, MD;  [facility];  Service: Cardiovascular;  Laterality: N/A;   PATCH ANGIOPLASTY Right 07/21/2019   Procedure: Patch Angioplasty Right Femoral Artery;  Surgeon: Nada Libman, MD;  [facility];  Service: Vascular;  Laterality: Right;   PERIPHERAL INTRAVASCULAR LITHOTRIPSY  11/25/2019   Procedure: INTRAVASCULAR LITHOTRIPSY;  Surgeon: Iran Ouch, MD;  [facility];  Service: Cardiovascular;;  LT. SFA   PERIPHERAL VASCULAR ATHERECTOMY  08/03/2019   Procedure: PERIPHERAL VASCULAR ATHERECTOMY;  Surgeon: Runell Gess, MD;  Location: [facility] INVASIVE CV LAB;  Service: Cardiovascular;;  right SFA, right TP trunk   PERIPHERAL VASCULAR ATHERECTOMY  11/25/2019   Procedure: PERIPHERAL VASCULAR ATHERECTOMY;  Surgeon: Iran Ouch, MD;  [facility];  Service: Cardiovascular;;  Lt.  POPLITEAL and AT   PERIPHERAL VASCULAR BALLOON ANGIOPLASTY Left 06/15/2019   Procedure: PERIPHERAL VASCULAR BALLOON ANGIOPLASTY;  Surgeon: Runell Gess, MD;  [facility];  Service: Cardiovascular;  Laterality: Left;  SFA UNSUCCESSFUL UNABLE TO CROSS LESION   PERIPHERAL VASCULAR BALLOON ANGIOPLASTY  08/03/2019   Procedure: PERIPHERAL VASCULAR BALLOON ANGIOPLASTY;  Surgeon: Runell Gess, MD;  [facility];  Service: Cardiovascular;;  right SFA, Right TP trunk   PERIPHERAL VASCULAR BALLOON ANGIOPLASTY  09/07/2021   Procedure: PERIPHERAL VASCULAR BALLOON ANGIOPLASTY;  Surgeon: Cephus Shelling, MD;  [facility];  Service: Cardiovascular;;   PERIPHERAL VASCULAR BALLOON ANGIOPLASTY Right 10/13/2021   Procedure: PERIPHERAL VASCULAR BALLOON ANGIOPLASTY;  Surgeon: Leonie Douglas, MD;  [facility];  Service: Cardiovascular;  Laterality: Right;   PERIPHERAL VASCULAR BALLOON ANGIOPLASTY  11/09/2022   Procedure: PERIPHERAL VASCULAR BALLOON ANGIOPLASTY;  Surgeon: Leonie Douglas, MD;  [facility];  Service: Cardiovascular;;  fem-pop bypass and AT   PORTACATH PLACEMENT Left 04/29/2023   Procedure: INSERTION PORT-A-CATH;  Surgeon: Fritzi Mandes, MD;  Location: [facility] ORS;  Service: General;  Laterality: Left;   PORTACATH PLACEMENT N/A 07/12/2023   Procedure: REVISION PORT-A-CATH;  Surgeon: Fritzi Mandes, MD;  Location: [facility] OR;  Service: General;  Laterality: N/A;   REMOVAL OF STONES  04/07/2023   Procedure: REMOVAL OF STONES;  Surgeon: Vida Rigger, MD;  Location: [facility] ENDOSCOPY;  Service: Gastroenterology;;   Dennison Mascot  04/07/2023   Procedure: Dennison Mascot;  Surgeon: Vida Rigger, MD;  Location: [facility] ENDOSCOPY;  Service: Gastroenterology;;   TEE WITHOUT CARDIOVERSION N/A 03/24/2020   Procedure: TRANSESOPHAGEAL ECHOCARDIOGRAM (TEE);  Surgeon: Linden Dolin, MD;  Location: [facility] OR;  Service: Open Heart Surgery;  Laterality: N/A;   TONSILLECTOMY     and adenoidectomy   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/2019   Procedure: TRANSMETATARSAL AMPUTATION;  Surgeon:  Vivi Barrack, DPM;  Location: [facility] OR;  Service: Podiatry;  Laterality: Right;   TRANSMETATARSAL AMPUTATION Left 05/11/2021   Procedure: LEFT TRANSMETATARSAL AMPUTATION;  Surgeon: Maeola Harman, MD;  Location: [facility] OR;  Service: Vascular;  Laterality: Left;   TUBAL LIGATION      I have reviewed the social history and family history with the patient and they are unchanged from previous note.  ALLERGIES:  is allergic to versed [midazolam], augmentin [amoxicillin-pot clavulanate], bactrim [sulfamethoxazole-trimethoprim], demerol [meperidine], meperidine hcl, scopolamine, and tramadol.  MEDICATIONS:  Current Outpatient Medications  Medication Sig Dispense Refill   aspirin EC 81 MG tablet Take 81 mg by mouth at bedtime. Swallow whole.     atorvastatin (LIPITOR) 80 MG tablet Take 1 tablet (80 mg total) by mouth daily. 90 tablet 3   brimonidine (ALPHAGAN) 0.2 % ophthalmic solution Place 1 drop into the right eye 2 (two) times daily.     empagliflozin (JARDIANCE) 25  MG TABS tablet Take 1 tablet (25 mg total) by mouth daily before breakfast. 90 tablet 3   hydrochlorothiazide (HYDRODIURIL) 12.5 MG tablet Take 1 tablet (12.5 mg total) by mouth daily. 90 tablet 0   Insulin Lispro Prot & Lispro (HUMALOG MIX 75/25 KWIKPEN) (75-25) 100 UNIT/ML Kwikpen INJECT 6 UNITS SUBCUTANEOUSLY ONCE DAILY WITH BREAKFAST 15 mL 4   Insulin Pen Needle (ULTRA-THIN II MINI PEN NEEDLE) 31G X 5 MM MISC Use as directed to check blood glucose 50 each 2   Lancets (ONETOUCH DELICA PLUS LANCET33G) MISC Use to check blood sugars twice daily. (Patient taking differently: Use to check blood sugars  daily.) 100 each 5   lidocaine-prilocaine (EMLA) cream Apply to affected area once (Patient taking differently: Apply 1 Application topically as needed (port).) 30 g 3   MAGNESIUM-OXIDE 400 (240 Mg) MG tablet Take 1 tablet by mouth twice daily 60 tablet 0   metFORMIN (GLUCOPHAGE) 1000 MG tablet Take 1 tablet (1,000 mg total) by  mouth 2 (two) times daily. 180 tablet 2   metoprolol tartrate (LOPRESSOR) 50 MG tablet Take 1 tablet by mouth twice daily 180 tablet 0   Multiple Vitamins-Minerals (MULTI FOR HER PO) Take 1 tablet by mouth daily.     mupirocin ointment (BACTROBAN) 2 % Apply 1 Application topically 2 (two) times daily. (Patient taking differently: Apply 1 Application topically 2 (two) times daily as needed (wound care for toes).) 30 g 2   ondansetron (ZOFRAN) 8 MG tablet Take 1 tablet (8 mg total) by mouth every 8 (eight) hours as needed for nausea or vomiting. Start on the third day after cisplatin. 30 tablet 1   ONETOUCH VERIO test strip USE TO CHECK BLOOD SUGARS ONCE DAILY 100 each 2   pantoprazole (PROTONIX) 40 MG tablet Take 1 tablet by mouth once daily 90 tablet 3   prochlorperazine (COMPAZINE) 10 MG tablet Take 1 tablet (10 mg total) by mouth every 6 (six) hours as needed (Nausea or vomiting). 30 tablet 1   Propylene Glycol (SYSTANE BALANCE) 0.6 % SOLN Place 1 drop into the left eye 3 (three) times daily as needed (dry/irritated eyes).     repaglinide (PRANDIN) 1 MG tablet Take 1 tablet (1 mg total) by mouth 2 (two) times daily before a meal. Take 1 tab by breakfast and 1 tab before dinner 180 tablet 4   rivaroxaban (XARELTO) 2.5 MG TABS tablet Take 1 tablet by mouth twice daily 60 tablet 11   telmisartan (MICARDIS) 80 MG tablet Take 1 tablet by mouth once daily 90 tablet 0   No current facility-administered medications for this visit.    PHYSICAL EXAMINATION: ECOG PERFORMANCE STATUS: 1 - Symptomatic but completely ambulatory  Vitals:   11/07/23 1110 11/07/23 1111  BP: (!) 134/47 (!) 160/62  Pulse: 84   Resp: 18   Temp: 97.8 F (36.6 C)   SpO2: 100%    Wt Readings from Last 3 Encounters:  11/07/23 144 lb 4.8 oz (65.5 kg)  10/24/23 139 lb 4.8 oz (63.2 kg)  10/11/23 140 lb 4.8 oz (63.6 kg)     GENERAL:alert, no distress and comfortable SKIN: skin color, texture, turgor are normal, no rashes or  significant lesions EYES: normal, Conjunctiva are pink and non-injected, sclera clear NECK: supple, thyroid normal size, non-tender, without nodularity LYMPH:  no palpable lymphadenopathy in the cervical, axillary  LUNGS: clear to auscultation and percussion with normal breathing effort HEART: regular rate & rhythm and no murmurs and no lower extremity edema ABDOMEN:abdomen  soft, non-tender and normal bowel sounds Musculoskeletal:no cyanosis of digits and no clubbing  NEURO: alert & oriented x 3 with fluent speech, no focal motor/sensory deficits    LABORATORY DATA:  I have reviewed the data as listed    Latest Ref Rng & Units 11/07/2023   10:45 AM 10/24/2023    9:49 AM 10/11/2023    7:46 AM  CBC  WBC 4.0 - 10.5 K/uL 6.9  8.9  11.1   Hemoglobin 12.0 - 15.0 g/dL 8.7  7.9  8.0   Hematocrit 36.0 - 46.0 % 27.9  25.0  24.6   Platelets 150 - 400 K/uL 141  217  193         Latest Ref Rng & Units 11/07/2023   10:45 AM 10/24/2023    9:49 AM 10/11/2023    7:46 AM  CMP  Glucose 70 - 99 mg/dL 086  578  469   BUN 8 - 23 mg/dL 25  27  28    Creatinine 0.44 - 1.00 mg/dL 6.29  5.28  4.13   Sodium 135 - 145 mmol/L 139  138  137   Potassium 3.5 - 5.1 mmol/L 4.4  4.5  4.0   Chloride 98 - 111 mmol/L 104  102  101   CO2 22 - 32 mmol/L 29  27  27    Calcium 8.9 - 10.3 mg/dL 9.2  9.1  9.4   Total Protein 6.5 - 8.1 g/dL 6.2  6.5  6.2   Total Bilirubin 0.0 - 1.2 mg/dL 0.7  0.5  0.5   Alkaline Phos 38 - 126 U/L 105  83  83   AST 15 - 41 U/L 28  28  26    ALT 0 - 44 U/L 15  11  11        RADIOGRAPHIC STUDIES: I have personally reviewed the radiological images as listed and agreed with the findings in the report. No results found.    Orders Placed This Encounter  Procedures   Ambulatory referral to Radiation Oncology    Referral Priority:   Routine    Referral Type:   Consultation    Referral Reason:   Specialty Services Required    Requested Specialty:   Radiation Oncology    Number of  Visits Requested:   1   Sample to Blood Bank   All questions were answered. The patient knows to call the clinic with any problems, questions or concerns. No barriers to learning was detected. The total time spent in the appointment was 25 minutes.     Malachy Mood, MD 11/07/2023

## 2023-11-07 NOTE — Assessment & Plan Note (Signed)
cT4N0M0, stage III -Patient had 1 episode of abdominal pain in October 2023, and was found to have a large gallbladder stone. -She underwent laparoscopic attempted cholecystectomy but was not able to be resected due to adhesions to duodenum and colon. -She developed jaundice in June 2024, status post ERCP and stent placement in [place].  CT and MRI scan showed a liver mass adjacent to the gallbladder fossae, and a biopsy confirmed adenocarcinoma.  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  Unfortunately this was felt to be not resectable by Dr. Freida Busman. -I reviewed her CT chest from April 22, 2023, which showed no evidence of distant metastasis. -I recommended first-line cisplatin, gemcitabine  every 14 days and durvalumab every 28 days.  If she responded well, will stop chemo after 4 to 6 months of treatment, and changed to maintenance therapy. She started on 05/03/23 every 2 weeks and has been tolerating well.  We have also discussed option of consolidation radiation at some point. -restaging CT 07/08/2023 showed partial response in the primary site tumor with direct invasion to liver.  No new disease or question.  Will continue chemotherapy. -case reviewed in GI tumor board, consolidation RT is also an option

## 2023-11-08 ENCOUNTER — Ambulatory Visit: Payer: Medicare Other

## 2023-11-08 ENCOUNTER — Inpatient Hospital Stay: Payer: Medicare Other

## 2023-11-08 VITALS — BP 138/48 | HR 88 | Temp 98.7°F | Resp 18

## 2023-11-08 DIAGNOSIS — Z5111 Encounter for antineoplastic chemotherapy: Secondary | ICD-10-CM | POA: Diagnosis not present

## 2023-11-08 DIAGNOSIS — D6481 Anemia due to antineoplastic chemotherapy: Secondary | ICD-10-CM | POA: Diagnosis not present

## 2023-11-08 DIAGNOSIS — Z5112 Encounter for antineoplastic immunotherapy: Secondary | ICD-10-CM | POA: Diagnosis not present

## 2023-11-08 DIAGNOSIS — C23 Malignant neoplasm of gallbladder: Secondary | ICD-10-CM

## 2023-11-08 DIAGNOSIS — C787 Secondary malignant neoplasm of liver and intrahepatic bile duct: Secondary | ICD-10-CM | POA: Diagnosis not present

## 2023-11-08 DIAGNOSIS — Z79620 Long term (current) use of immunosuppressive biologic: Secondary | ICD-10-CM | POA: Diagnosis not present

## 2023-11-08 LAB — T4: T4, Total: 13.2 ug/dL — ABNORMAL HIGH (ref 4.5–12.0)

## 2023-11-08 LAB — CANCER ANTIGEN 19-9: CA 19-9: 486 U/mL — ABNORMAL HIGH (ref 0–35)

## 2023-11-08 MED ORDER — SODIUM CHLORIDE 0.9 % IV SOLN: Freq: Once | INTRAVENOUS | Status: AC

## 2023-11-08 MED ORDER — HEPARIN SOD (PORK) LOCK FLUSH 100 UNIT/ML IV SOLN
500.0000 [IU] | Freq: Once | INTRAVENOUS | Status: AC | PRN
  Administered 2023-11-08: 500 [IU]

## 2023-11-08 MED ORDER — PALONOSETRON HCL INJECTION 0.25 MG/5ML
0.2500 mg | Freq: Once | INTRAVENOUS | Status: AC
  Administered 2023-11-08: 0.25 mg via INTRAVENOUS
  Filled 2023-11-08: qty 5

## 2023-11-08 MED ORDER — SODIUM CHLORIDE 0.9 % IV SOLN
20.0000 mg/m2 | Freq: Once | INTRAVENOUS | Status: AC
  Administered 2023-11-08: 34 mg via INTRAVENOUS
  Filled 2023-11-08: qty 34

## 2023-11-08 MED ORDER — SODIUM CHLORIDE 0.9 % IV SOLN
600.0000 mg/m2 | Freq: Once | INTRAVENOUS | Status: AC
  Administered 2023-11-08: 1027 mg via INTRAVENOUS
  Filled 2023-11-08: qty 25.98

## 2023-11-08 MED ORDER — POTASSIUM CHLORIDE IN NACL 20-0.9 MEQ/L-% IV SOLN
Freq: Once | INTRAVENOUS | Status: AC
  Filled 2023-11-08: qty 1000

## 2023-11-08 MED ORDER — SODIUM CHLORIDE 0.9% FLUSH
10.0000 mL | INTRAVENOUS | Status: DC | PRN
  Administered 2023-11-08: 10 mL

## 2023-11-08 MED ORDER — SODIUM CHLORIDE 0.9 % IV SOLN
150.0000 mg | Freq: Once | INTRAVENOUS | Status: AC
  Administered 2023-11-08: 150 mg via INTRAVENOUS
  Filled 2023-11-08: qty 150

## 2023-11-08 MED ORDER — MAGNESIUM SULFATE 4 GM/100ML IV SOLN
4.0000 g | Freq: Once | INTRAVENOUS | Status: AC
  Administered 2023-11-08: 4 g via INTRAVENOUS
  Filled 2023-11-08: qty 100

## 2023-11-08 MED ORDER — FUROSEMIDE 10 MG/ML IJ SOLN
20.0000 mg | Freq: Once | INTRAMUSCULAR | Status: AC
  Administered 2023-11-08: 20 mg via INTRAVENOUS
  Filled 2023-11-08: qty 2

## 2023-11-08 NOTE — Progress Notes (Signed)
Urine output was . Per Dr. Mosetta Putt it is ok to run post fluids with cisplatin infusion.

## 2023-11-08 NOTE — Patient Instructions (Signed)
[facility]  Discharge Instructions: Thank you for choosing [facility] to provide your oncology and hematology care.   If you have a lab appointment with the Cancer Center, please go directly to the Cancer Center and check in at the registration area.   Wear comfortable clothing and clothing appropriate for easy access to any Portacath or PICC line.   We strive to give you quality time with your provider. You may need to reschedule your appointment if you arrive late (15 or more minutes).  Arriving late affects you and other patients whose appointments are after yours.  Also, if you miss three or more appointments without notifying the office, you may be dismissed from the clinic at the provider's discretion.      For prescription refill requests, have your pharmacy contact our office and allow 72 hours for refills to be completed.    Today you received the following chemotherapy and/or immunotherapy agents gemzar, cisplatin      To help prevent nausea and vomiting after your treatment, we encourage you to take your nausea medication as directed.  BELOW ARE SYMPTOMS THAT SHOULD BE REPORTED IMMEDIATELY: *FEVER GREATER THAN 100.4 F (38 C) OR HIGHER *CHILLS OR SWEATING *NAUSEA AND VOMITING THAT IS NOT CONTROLLED WITH YOUR NAUSEA MEDICATION *UNUSUAL SHORTNESS OF BREATH *UNUSUAL BRUISING OR BLEEDING *URINARY PROBLEMS (pain or burning when urinating, or frequent urination) *BOWEL PROBLEMS (unusual diarrhea, constipation, pain near the anus) TENDERNESS IN MOUTH AND THROAT WITH OR WITHOUT PRESENCE OF ULCERS (sore throat, sores in mouth, or a toothache) UNUSUAL RASH, SWELLING OR PAIN  UNUSUAL VAGINAL DISCHARGE OR ITCHING   Items with * indicate a potential emergency and should be followed up as soon as possible or go to the Emergency Department if any problems should occur.  Please show the CHEMOTHERAPY ALERT CARD or  IMMUNOTHERAPY ALERT CARD at check-in to the Emergency Department and triage nurse.  Should you have questions after your visit or need to cancel or reschedule your appointment, please contact [facility]  Dept: [phone]  and follow the prompts.  Office hours are 8:00 a.m. to 4:30 p.m. Monday - Friday. Please note that voicemails left after 4:00 p.m. may not be returned until the following business day.  We are closed weekends and major holidays. You have access to a nurse at all times for urgent questions. Please call the main number to the clinic Dept: [phone] and follow the prompts.   For any non-urgent questions, you may also contact your provider using MyChart. We now offer e-Visits for anyone 40 and older to request care online for non-urgent symptoms. For details visit [email or web address].   Also download the MyChart app! Go to the app store, search "MyChart", open the app, select [facility], and log in with your MyChart username and password.

## 2023-11-11 NOTE — Progress Notes (Signed)
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   HISTORY OF PRESENT ILLNESS: [name] is a 83 y.o. female seen at the request of Dr. Mosetta Putt for a diagnosis of cholangiocarcinoma.  The patient was originally diagnosed with cholelithiasis in October 2023 and underwent an attempted laparoscopic cholecystectomy on 10/12/2022 with Dr. Freida Busman but this was aborted due to adhesions from the duodenum and colon.  The patient developed jaundice in June 2024 and underwent ERCP with stent placement in the common bile duct on 04/07/2023.  Dr. Ewing Schlein performed the procedure, and a temporary plastic biliary stent was placed into the common bile duct after biliary sphincterotomy was performed.  Cells for cytology from that procedure showed atypical cells with atypical large cells suspicious for tumor.  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  No evidence of metastatic disease was noted so she was started on first-line chemotherapy with cisplatin and Gemzar with durvalumab.  She had a partial response to treatment based on CT imaging in October 2024.  When she was seen on 11/07/2023, she he was offered converting to maintenance immunotherapy and is seen to consider additional treatment locally with radiation.    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 There is a small calcified mass in the L:A near the LA appendage. No valvular masses seen with associated mitral annular calcification. LA Volume/ BSA27.4 ml/m2 No AS. Right ventricular systolic pressure is elevated at .   Jaundice    Myocardial infarction [facility])    June 2021   Neuromuscular disorder (HCC)    neuropathy in bilateral feet   Peripheral vascular disease (HCC)    Pneumonia    not hosp.    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  PTCAs with drug coated balloon Lt ant tibial artery and Lt popliteal artery  11/26/2019       PAST SURGICAL HISTORY: Past Surgical History:  Procedure Laterality Date   ABDOMINAL AORTAGRAM  11/25/2019   ABDOMINAL AORTOGRAM W/LOWER EXTREMITY    ABDOMINAL AORTOGRAM N/A 06/15/2019   Procedure: ABDOMINAL AORTOGRAM;  Surgeon: Runell Gess, MD;  [facility];  Service: Cardiovascular;  Laterality: N/A;   ABDOMINAL AORTOGRAM W/LOWER EXTREMITY N/A  11/25/2019   Procedure: ABDOMINAL AORTOGRAM W/LOWER EXTREMITY;  Surgeon: Iran Ouch, MD;  [facility];  Service: Cardiovascular;  Laterality: N/A;  Lt leg   ABDOMINAL AORTOGRAM W/LOWER EXTREMITY N/A 08/01/2020   Procedure: ABDOMINAL AORTOGRAM W/LOWER EXTREMITY;  Surgeon: Runell Gess, MD;  [facility];  Service: Cardiovascular;  Laterality: N/A;   ABDOMINAL AORTOGRAM W/LOWER EXTREMITY N/A 04/28/2021   Procedure: ABDOMINAL  AORTOGRAM W/LOWER EXTREMITY;  Surgeon: Sherren Kerns, MD;  [facility];  Service: Cardiovascular;  Laterality: N/A;   ABDOMINAL AORTOGRAM W/LOWER EXTREMITY N/A 10/13/2021   Procedure: ABDOMINAL AORTOGRAM W/LOWER EXTREMITY;  Surgeon: Leonie Douglas, MD;  [facility];  Service: Cardiovascular;  Laterality: N/A;   ABDOMINAL AORTOGRAM W/LOWER EXTREMITY N/A 11/09/2022   Procedure: ABDOMINAL AORTOGRAM W/LOWER EXTREMITY;  Surgeon: Leonie Douglas, MD;  [facility];  Service: Cardiovascular;  Laterality: N/A;   ABDOMINAL AORTOGRAM W/LOWER EXTREMITY N/A 11/16/2022   Procedure: ABDOMINAL AORTOGRAM W/LOWER EXTREMITY;  Surgeon: Leonie Douglas, MD;  [facility];  Service: Cardiovascular;  Laterality: N/A;   AMPUTATION TOE Left 05/03/2021   Procedure: AMPUTATION OF THIRD LEFT  TOE;  Surgeon: Leonie Douglas, MD;  Location[facility] OR;  Service: Vascular;  Laterality: Left;   APPENDECTOMY     APPLICATION OF WOUND VAC Right 07/21/2019   Procedure: Application Of Prevena Wound Vac Right Groin;  Surgeon: Nada Libman, MD;  Location: [facility] OR;  Service: Vascular;  Laterality: Right;   BILIARY BRUSHING  04/07/2023   Procedure: BILIARY BRUSHING;  Surgeon: Vida Rigger, MD;  Location: [facility] ENDOSCOPY;  Service: Gastroenterology;;   BILIARY STENT PLACEMENT  04/07/2023   Procedure: BILIARY STENT PLACEMENT;  Surgeon: Vida Rigger, MD;  Location: [facility] ENDOSCOPY;  Service: Gastroenterology;;   CARPAL TUNNEL RELEASE Left    CARPAL TUNNEL RELEASE Right    CESAREAN SECTION     x 2   CHOLECYSTECTOMY N/A 10/12/2022   Procedure: LAPAROSCOPIC PARTIAL FENESTRATING CHOLECYSTECTOMY;  Surgeon: Fritzi Mandes, MD;  [facility];  Service: General;  Laterality: N/A;   COLONOSCOPY     CORONARY ARTERY BYPASS GRAFT N/A 03/24/2020   Procedure: CORONARY ARTERY BYPASS GRAFTING (CABG) using LIMA to LAD (m); RIMA to RAMUS; Endoscopic Right Greater Saphenous Vein: SVG to  Diag1; SVG to PLB (right); and SVG to PL (left).;  Surgeon: Linden Dolin, MD;  [facility];  Service: Open Heart Surgery;  Laterality: N/A;  BILATERAL IMA   ENDARTERECTOMY FEMORAL Right 07/21/2019   Procedure: RIGHT ENDARTERECTOMY FEMORAL WITH PATCH ANGIOPLASTY;  Surgeon: Nada Libman, MD;  [facility];  Service: Vascular;  Laterality: Right;   ENDARTERECTOMY FEMORAL Right 08/16/2020   Procedure: RIGHT FEMORAL ENDARTERECTOMY;  Surgeon: Maeola Harman, MD;  Location: [facility] OR;  Service: Vascular;  Laterality: Right;   ENDOVEIN HARVEST OF GREATER SAPHENOUS VEIN Right 03/24/2020   Procedure: ENDOVEIN HARVEST OF GREATER SAPHENOUS VEIN;  Surgeon: Linden Dolin, MD;  [facility];  Service: Open Heart Surgery;  Laterality: Right;   ERCP N/A 04/07/2023   Procedure: ENDOSCOPIC RETROGRADE CHOLANGIOPANCREATOGRAPHY (ERCP);  Surgeon: Vida Rigger, MD;  Location: [facility] ENDOSCOPY;  Service: Gastroenterology;  Laterality: N/A;   EYE SURGERY     cataract removal bilaterally   FEMORAL-POPLITEAL BYPASS GRAFT Right 08/16/2020   Procedure: BYPASS GRAFT FEMORAL-POPLITEAL ARTERY;  Surgeon: Maeola Harman, MD;  Location: [facility] OR;  Service: Vascular;  Laterality: Right;   FEMORAL-POPLITEAL  BYPASS GRAFT Left 05/03/2021   Procedure: LEFT FEMORAL TO BELOW KNEE POPLITEAL ARTERY BYPASS GRAFTING WITH 6MMX80 PTFE GRAFT;  Surgeon: Leonie Douglas, MD;  [facility];  Service: Vascular;  Laterality: Left;   LEFT HEART CATH AND CORONARY ANGIOGRAPHY N/A 03/22/2020   Procedure: LEFT HEART CATH AND CORONARY ANGIOGRAPHY;  Surgeon: Swaziland, Peter M, MD;  Location: [facility] INVASIVE CV LAB;  Service: Cardiovascular;  Laterality: N/A;   LOWER EXTREMITY ANGIOGRAPHY Bilateral 06/15/2019   Procedure: Lower Extremity Angiography;  Surgeon: Runell Gess, MD;  Location: [facility] INV[facility] CV LAB;  Service: Cardiovascular;  Laterality: Bilateral;   LOWER EXTREMITY ANGIOGRAPHY Right 08/03/2019   Procedure: LOWER  EXTREMITY ANGIOGRAPHY;  Surgeon: Runell Gess, MD;  [facility];  Service: Cardiovascular;  Laterality: Right;   LOWER EXTREMITY ANGIOGRAPHY N/A 09/07/2021   Procedure: LOWER EXTREMITY ANGIOGRAPHY;  Surgeon: Cephus Shelling, MD;  [facility];  Service: Cardiovascular;  Laterality: N/A;   PATCH ANGIOPLASTY Right 07/21/2019   Procedure: Patch Angioplasty Right Femoral Artery;  Surgeon: Nada Libman, MD;  Location: [facility] OR;  Service: Vascular;  Laterality: Right;   PERIPHERAL INTRAVASCULAR LITHOTRIPSY  11/25/2019   Procedure: INTRAVASCULAR LITHOTRIPSY;  Surgeon: Iran Ouch, MD;  [facility];  Service: Cardiovascular;;  LT. SFA   PERIPHERAL VASCULAR ATHERECTOMY  08/03/2019   Procedure: PERIPHERAL VASCULAR ATHERECTOMY;  Surgeon: Runell Gess, MD;  Location: [facility] INVASIVE CV LAB;  Service: Cardiovascular;;  right SFA, right TP trunk   PERIPHERAL VASCULAR ATHERECTOMY  11/25/2019   Procedure: PERIPHERAL VASCULAR ATHERECTOMY;  Surgeon: Iran Ouch, MD;  [facility];  Service: Cardiovascular;;  Lt.  POPLITEAL and AT   PERIPHERAL VASCULAR BALLOON ANGIOPLASTY Left 06/15/2019   Procedure: PERIPHERAL VASCULAR BALLOON ANGIOPLASTY;  Surgeon: Runell Gess, MD;  [facility];  Service: Cardiovascular;  Laterality: Left;  SFA UNSUCCESSFUL UNABLE TO CROSS LESION   PERIPHERAL VASCULAR BALLOON ANGIOPLASTY  08/03/2019   Procedure: PERIPHERAL VASCULAR BALLOON ANGIOPLASTY;  Surgeon: Runell Gess, MD;  [facility];  Service: Cardiovascular;;  right SFA, Right TP trunk   PERIPHERAL VASCULAR BALLOON ANGIOPLASTY  09/07/2021   Procedure: PERIPHERAL VASCULAR BALLOON ANGIOPLASTY;  Surgeon: Cephus Shelling, MD;  [facility];  Service: Cardiovascular;;   PERIPHERAL VASCULAR BALLOON ANGIOPLASTY Right 10/13/2021   Procedure: PERIPHERAL VASCULAR BALLOON ANGIOPLASTY;  Surgeon:  Leonie Douglas, MD;  [facility];  Service: Cardiovascular;  Laterality: Right;   PERIPHERAL VASCULAR BALLOON ANGIOPLASTY  11/09/2022   Procedure: PERIPHERAL VASCULAR BALLOON ANGIOPLASTY;  Surgeon: Leonie Douglas, MD;  [facility];  Service: Cardiovascular;;  fem-pop bypass and AT   PORTACATH PLACEMENT Left 04/29/2023   Procedure: INSERTION PORT-A-CATH;  Surgeon: Fritzi Mandes, MD;  Location: [facility] ORS;  Service: General;  Laterality: Left;   PORTACATH PLACEMENT N/A 07/12/2023   Procedure: REVISION PORT-A-CATH;  Surgeon: Fritzi Mandes, MD;  Location: [facility] OR;  Service: General;  Laterality: N/A;   REMOVAL OF STONES  04/07/2023   Procedure: REMOVAL OF STONES;  Surgeon: Vida Rigger, MD;  Location: [facility] ENDOSCOPY;  Service: Gastroenterology;;   Dennison Mascot  04/07/2023   Procedure: Dennison Mascot;  Surgeon: Vida Rigger, MD;  Location: [facility] ENDOSCOPY;  Service: Gastroenterology;;   TEE WITHOUT CARDIOVERSION N/A 03/24/2020   Procedure: TRANSESOPHAGEAL ECHOCARDIOGRAM (TEE);  Surgeon: Linden Dolin, MD;  Location: [facility] OR;  Service: Open Heart Surgery;  Laterality:  N/A;   TONSILLECTOMY     and adenoidectomy   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/2019   Procedure: TRANSMETATARSAL AMPUTATION;  Surgeon: Vivi Barrack, DPM;  [facility];  Service: Podiatry;  Laterality: Right;   TRANSMETATARSAL AMPUTATION Left 05/11/2021   Procedure: LEFT TRANSMETATARSAL AMPUTATION;  Surgeon: Maeola Harman, MD;  Location: [facility] OR;  Service: Vascular;  Laterality: Left;   TUBAL LIGATION       FAMILY HISTORY:  Family History  Problem Relation Age of Onset   Cancer - Prostate Father    Cancer - Colon Father    Stroke Mother    Hypertension Mother    Hyperlipidemia Mother    Melanoma Brother      SOCIAL HISTORY:  reports that she quit smoking about 51 years ago. Her smoking use included cigarettes. She has never used smokeless tobacco. She reports that she does  not drink alcohol and does not use drugs.   ALLERGIES: Versed [midazolam], Augmentin [amoxicillin-pot clavulanate], Bactrim [sulfamethoxazole-trimethoprim], Demerol [meperidine], Meperidine hcl, Scopolamine, and Tramadol   MEDICATIONS:  Current Outpatient Medications  Medication Sig Dispense Refill   aspirin EC 81 MG tablet Take 81 mg by mouth at bedtime. Swallow whole.     atorvastatin (LIPITOR) 80 MG tablet Take 1 tablet (80 mg total) by mouth daily. 90 tablet 3   brimonidine (ALPHAGAN) 0.2 % ophthalmic solution Place 1 drop into the right eye 2 (two) times daily.     empagliflozin (JARDIANCE) 25 MG TABS tablet Take 1 tablet (25 mg total) by mouth daily before breakfast. 90 tablet 3   hydrochlorothiazide (HYDRODIURIL) 12.5 MG tablet Take 1 tablet (12.5 mg total) by mouth daily. 90 tablet 0   Insulin Lispro Prot & Lispro (HUMALOG MIX 75/25 KWIKPEN) (75-25) 100 UNIT/ML Kwikpen INJECT 6 UNITS SUBCUTANEOUSLY ONCE DAILY WITH BREAKFAST 15 mL 4   Insulin Pen Needle (ULTRA-THIN II MINI PEN NEEDLE) 31G X 5 MM MISC Use as directed to check blood glucose 50 each 2   Lancets (ONETOUCH DELICA PLUS LANCET33G) MISC Use to check blood sugars twice daily. (Patient taking differently: Use to check blood sugars  daily.) 100 each 5   lidocaine-prilocaine (EMLA) cream Apply to affected area once (Patient taking differently: Apply 1 Application topically as needed (port).) 30 g 3   MAGNESIUM-OXIDE 400 (240 Mg) MG tablet Take 1 tablet by mouth twice daily 60 tablet 0   metFORMIN (GLUCOPHAGE) 1000 MG tablet Take 1 tablet (1,000 mg total) by mouth 2 (two) times daily. 180 tablet 2   metoprolol tartrate (LOPRESSOR) 50 MG tablet Take 1 tablet by mouth twice daily 180 tablet 0   Multiple Vitamins-Minerals (MULTI FOR HER PO) Take 1 tablet by mouth daily.     mupirocin ointment (BACTROBAN) 2 % Apply 1 Application topically 2 (two) times daily. (Patient taking differently: Apply 1 Application topically 2 (two) times daily  as needed (wound care for toes).) 30 g 2   ondansetron (ZOFRAN) 8 MG tablet Take 1 tablet (8 mg total) by mouth every 8 (eight) hours as needed for nausea or vomiting. Start on the third day after cisplatin. 30 tablet 1   ONETOUCH VERIO test strip USE TO CHECK BLOOD SUGARS ONCE DAILY 100 each 2   pantoprazole (PROTONIX) 40 MG tablet Take 1 tablet by mouth once daily 90 tablet 3   prochlorperazine (COMPAZINE) 10 MG tablet Take 1 tablet (10 mg total) by mouth every 6 (six) hours as needed (Nausea or vomiting). 30 tablet 1   Propylene Glycol (  SYSTANE BALANCE) 0.6 % SOLN Place 1 drop into the left eye 3 (three) times daily as needed (dry/irritated eyes).     repaglinide (PRANDIN) 1 MG tablet Take 1 tablet (1 mg total) by mouth 2 (two) times daily before a meal. Take 1 tab by breakfast and 1 tab before dinner 180 tablet 4   rivaroxaban (XARELTO) 2.5 MG TABS tablet Take 1 tablet by mouth twice daily 60 tablet 11   telmisartan (MICARDIS) 80 MG tablet Take 1 tablet by mouth once daily 90 tablet 0   No current facility-administered medications for this visit.     REVIEW OF SYSTEMS: On review of systems, the patient reports that *** is doing well overall. *** denies any chest pain, shortness of breath, cough, fevers, chills, night sweats, unintended weight changes. *** denies any bowel or bladder disturbances, and denies abdominal pain, nausea or vomiting. *** denies any new musculoskeletal or joint aches or pains. A complete review of systems is obtained and is otherwise negative.     PHYSICAL EXAM:  Wt Readings from Last 3 Encounters:  11/07/23 144 lb 4.8 oz (65.5 kg)  10/24/23 139 lb 4.8 oz (63.2 kg)  10/11/23 140 lb 4.8 oz (63.6 kg)   Temp Readings from Last 3 Encounters:  11/08/23 98.7 F (37.1 C) (Temporal)  11/07/23 97.8 F (36.6 C) (Temporal)  10/25/23 98.5 F (36.9 C) (Oral)   BP Readings from Last 3 Encounters:  11/08/23 (!) 138/48  11/07/23 (!) 160/62  10/25/23 (!) 157/54    Pulse Readings from Last 3 Encounters:  11/08/23 88  11/07/23 84  10/25/23 76    /10  In general this is a well appearing *** in no acute distress. ***'s alert and oriented x4 and appropriate throughout the examination. Cardiopulmonary assessment is negative for acute distress and *** exhibits normal effort.     ECOG = ***  0 - Asymptomatic (Fully active, able to carry on all predisease activities without restriction)  1 - Symptomatic but completely ambulatory (Restricted in physically strenuous activity but ambulatory and able to carry out work of a light or sedentary nature. For example, light housework, office work)  2 - Symptomatic, <50% in bed during the day (Ambulatory and capable of all self care but unable to carry out any work activities. Up and about more than 50% of waking hours)  3 - Symptomatic, >50% in bed, but not bedbound (Capable of only limited self-care, confined to bed or chair 50% or more of waking hours)  4 - Bedbound (Completely disabled. Cannot carry on any self-care. Totally confined to bed or chair)  5 - Death   Santiago Glad MM, Creech RH, Tormey DC, et al. [phone]). "Toxicity and response criteria of the [facility]". Am. Evlyn Clines. Oncol. 5 (6): 649-55    LABORATORY DATA:  Lab Results  Component Value Date   WBC 6.9 11/07/2023   HGB 8.7 (L) 11/07/2023   HCT 27.9 (L) 11/07/2023   MCV 100.7 (H) 11/07/2023   PLT 141 (L) 11/07/2023   Lab Results  Component Value Date   NA 139 11/07/2023   K 4.4 11/07/2023   CL 104 11/07/2023   CO2 29 11/07/2023   Lab Results  Component Value Date   ALT 15 11/07/2023   AST 28 11/07/2023   ALKPHOS 105 11/07/2023   BILITOT 0.7 11/07/2023      RADIOGRAPHY: No results found.     IMPRESSION/PLAN: 1.   In a visit lasting *** minutes, greater than  50% of the time was spent face to face discussing the patient's condition, in preparation for the discussion, and coordinating the patient's care.    The above documentation reflects my direct findings during this shared patient visit. Please see the separate note by Dr. Mitzi Hansen on this date for the remainder of the patient's plan of care.    Osker Mason, [facility]   **Disclaimer: This note was dictated with voice recognition software. Similar sounding words can inadvertently be transcribed and this note may contain transcription errors which may not have been corrected upon publication of note.**

## 2023-11-11 NOTE — Progress Notes (Signed)
GI Location of Tumor / Histology: Gallbladder  [name] presented  Biopsies of   Past/Anticipated interventions by surgeon, if any:   Past/Anticipated interventions by medical oncology, if any:   Weight changes, if any:   Bowel/Bladder complaints, if any:   Nausea / Vomiting, if any:   Pain issues, if any:    Any blood per rectum:     SAFETY ISSUES: Prior radiation?  Pacemaker/ICD?  Possible current pregnancy?  Is the patient on methotrexate?   Current Complaints/Details:

## 2023-11-12 ENCOUNTER — Encounter: Payer: Self-pay | Admitting: Radiation Oncology

## 2023-11-12 ENCOUNTER — Ambulatory Visit
Admission: RE | Admit: 2023-11-12 | Discharge: 2023-11-12 | Disposition: A | Discharge status: Home / Self Care | Payer: Medicare Other | Source: Ambulatory Visit | Attending: Hematology | Admitting: Hematology

## 2023-11-12 ENCOUNTER — Ambulatory Visit
Admission: RE | Admit: 2023-11-12 | Discharge: 2023-11-12 | Discharge status: Home / Self Care | Payer: Medicare Other | Source: Ambulatory Visit | Attending: Radiation Oncology | Admitting: Radiation Oncology

## 2023-11-12 VITALS — BP 157/58 | HR 94 | Temp 97.5°F | Resp 18 | Ht 65.5 in | Wt 145.2 lb

## 2023-11-12 DIAGNOSIS — Z87891 Personal history of nicotine dependence: Secondary | ICD-10-CM | POA: Diagnosis not present

## 2023-11-12 DIAGNOSIS — I2510 Atherosclerotic heart disease of native coronary artery without angina pectoris: Secondary | ICD-10-CM | POA: Insufficient documentation

## 2023-11-12 DIAGNOSIS — Z8042 Family history of malignant neoplasm of prostate: Secondary | ICD-10-CM | POA: Insufficient documentation

## 2023-11-12 DIAGNOSIS — Z794 Long term (current) use of insulin: Secondary | ICD-10-CM | POA: Diagnosis not present

## 2023-11-12 DIAGNOSIS — I10 Essential (primary) hypertension: Secondary | ICD-10-CM | POA: Diagnosis not present

## 2023-11-12 DIAGNOSIS — E785 Hyperlipidemia, unspecified: Secondary | ICD-10-CM | POA: Diagnosis not present

## 2023-11-12 DIAGNOSIS — K219 Gastro-esophageal reflux disease without esophagitis: Secondary | ICD-10-CM | POA: Diagnosis not present

## 2023-11-12 DIAGNOSIS — Z7984 Long term (current) use of oral hypoglycemic drugs: Secondary | ICD-10-CM | POA: Insufficient documentation

## 2023-11-12 DIAGNOSIS — R011 Cardiac murmur, unspecified: Secondary | ICD-10-CM | POA: Insufficient documentation

## 2023-11-12 DIAGNOSIS — C23 Malignant neoplasm of gallbladder: Secondary | ICD-10-CM | POA: Diagnosis not present

## 2023-11-12 DIAGNOSIS — M129 Arthropathy, unspecified: Secondary | ICD-10-CM | POA: Diagnosis not present

## 2023-11-12 DIAGNOSIS — Z7982 Long term (current) use of aspirin: Secondary | ICD-10-CM | POA: Insufficient documentation

## 2023-11-12 DIAGNOSIS — Z800 Family history of malignant neoplasm of digestive organs: Secondary | ICD-10-CM | POA: Diagnosis not present

## 2023-11-12 DIAGNOSIS — Z79899 Other long term (current) drug therapy: Secondary | ICD-10-CM | POA: Diagnosis not present

## 2023-11-12 DIAGNOSIS — E119 Type 2 diabetes mellitus without complications: Secondary | ICD-10-CM | POA: Diagnosis not present

## 2023-11-12 DIAGNOSIS — E1151 Type 2 diabetes mellitus with diabetic peripheral angiopathy without gangrene: Secondary | ICD-10-CM | POA: Insufficient documentation

## 2023-11-12 DIAGNOSIS — I252 Old myocardial infarction: Secondary | ICD-10-CM | POA: Insufficient documentation

## 2023-11-12 DIAGNOSIS — Z7901 Long term (current) use of anticoagulants: Secondary | ICD-10-CM | POA: Diagnosis not present

## 2023-11-13 ENCOUNTER — Encounter: Payer: Self-pay | Admitting: Hematology

## 2023-11-13 NOTE — Addendum Note (Signed)
Addended by: Malachy Mood on: 11/13/2023 10:47 PM   Modules accepted: Orders

## 2023-11-15 NOTE — Progress Notes (Signed)
Pharmacist Chemotherapy Monitoring - Initial Assessment    Anticipated start date: 11/22/23   The following has been reviewed per standard work regarding the patient's treatment regimen: The patient's diagnosis, treatment plan and drug doses, and organ/hematologic function Lab orders and baseline tests specific to treatment regimen  The treatment plan start date, drug sequencing, and pre-medications Prior authorization status  Patient's documented medication list, including drug-drug interaction screen and prescriptions for anti-emetics and supportive care specific to the treatment regimen The drug concentrations, fluid compatibility, administration routes, and timing of the medications to be used The patient's access for treatment and lifetime cumulative dose history, if applicable  The patient's medication allergies and previous infusion related reactions, if applicable   Changes made to treatment plan:  N/A  Follow up needed:  N/A   Drusilla Kanner, PharmD, MBA

## 2023-11-19 ENCOUNTER — Other Ambulatory Visit: Payer: Self-pay | Admitting: Hematology

## 2023-11-19 ENCOUNTER — Other Ambulatory Visit: Payer: Self-pay | Admitting: Cardiovascular Disease

## 2023-11-21 ENCOUNTER — Inpatient Hospital Stay: Payer: Medicare Other

## 2023-11-21 ENCOUNTER — Encounter: Payer: Self-pay | Admitting: Nurse Practitioner

## 2023-11-21 ENCOUNTER — Inpatient Hospital Stay (HOSPITAL_BASED_OUTPATIENT_CLINIC_OR_DEPARTMENT_OTHER): Payer: Medicare Other | Admitting: Nurse Practitioner

## 2023-11-21 ENCOUNTER — Other Ambulatory Visit: Payer: Self-pay | Admitting: Nurse Practitioner

## 2023-11-21 VITALS — BP 136/49 | HR 83 | Temp 97.8°F | Resp 18 | Wt 143.9 lb

## 2023-11-21 DIAGNOSIS — Z79620 Long term (current) use of immunosuppressive biologic: Secondary | ICD-10-CM | POA: Diagnosis not present

## 2023-11-21 DIAGNOSIS — C23 Malignant neoplasm of gallbladder: Secondary | ICD-10-CM | POA: Diagnosis not present

## 2023-11-21 DIAGNOSIS — Z5111 Encounter for antineoplastic chemotherapy: Secondary | ICD-10-CM | POA: Diagnosis not present

## 2023-11-21 DIAGNOSIS — Z95828 Presence of other vascular implants and grafts: Secondary | ICD-10-CM

## 2023-11-21 DIAGNOSIS — Z5112 Encounter for antineoplastic immunotherapy: Secondary | ICD-10-CM | POA: Diagnosis not present

## 2023-11-21 DIAGNOSIS — D6481 Anemia due to antineoplastic chemotherapy: Secondary | ICD-10-CM | POA: Diagnosis not present

## 2023-11-21 DIAGNOSIS — C787 Secondary malignant neoplasm of liver and intrahepatic bile duct: Secondary | ICD-10-CM | POA: Diagnosis not present

## 2023-11-21 LAB — CBC WITH DIFFERENTIAL (CANCER CENTER ONLY)
Abs Immature Granulocytes: 0.04 10*3/uL (ref 0.00–0.07)
Basophils Absolute: 0.1 10*3/uL (ref 0.0–0.1)
Basophils Relative: 1 %
Eosinophils Absolute: 0.1 10*3/uL (ref 0.0–0.5)
Eosinophils Relative: 1 %
HCT: 26 % — ABNORMAL LOW (ref 36.0–46.0)
Hemoglobin: 8.1 g/dL — ABNORMAL LOW (ref 12.0–15.0)
Immature Granulocytes: 0 %
Lymphocytes Relative: 10 %
Lymphs Abs: 0.9 10*3/uL (ref 0.7–4.0)
MCH: 32.1 pg (ref 26.0–34.0)
MCHC: 31.2 g/dL (ref 30.0–36.0)
MCV: 103.2 fL — ABNORMAL HIGH (ref 80.0–100.0)
Monocytes Absolute: 1.2 10*3/uL — ABNORMAL HIGH (ref 0.1–1.0)
Monocytes Relative: 12 %
Neutro Abs: 7.3 10*3/uL (ref 1.7–7.7)
Neutrophils Relative %: 76 %
Platelet Count: 182 10*3/uL (ref 150–400)
RBC: 2.52 MIL/uL — ABNORMAL LOW (ref 3.87–5.11)
RDW: 21.3 % — ABNORMAL HIGH (ref 11.5–15.5)
WBC Count: 9.5 10*3/uL (ref 4.0–10.5)
nRBC: 0.2 % (ref 0.0–0.2)

## 2023-11-21 LAB — CMP (CANCER CENTER ONLY)
ALT: 13 U/L (ref 0–44)
AST: 27 U/L (ref 15–41)
Albumin: 3.5 g/dL (ref 3.5–5.0)
Alkaline Phosphatase: 103 U/L (ref 38–126)
Anion gap: 8 (ref 5–15)
BUN: 31 mg/dL — ABNORMAL HIGH (ref 8–23)
CO2: 27 mmol/L (ref 22–32)
Calcium: 9.4 mg/dL (ref 8.9–10.3)
Chloride: 104 mmol/L (ref 98–111)
Creatinine: 1.21 mg/dL — ABNORMAL HIGH (ref 0.44–1.00)
GFR, Estimated: 45 mL/min — ABNORMAL LOW (ref >60)
Glucose, Bld: 185 mg/dL — ABNORMAL HIGH (ref 70–99)
Potassium: 4.4 mmol/L (ref 3.5–5.1)
Sodium: 139 mmol/L (ref 135–145)
Total Bilirubin: 0.8 mg/dL (ref 0.0–1.2)
Total Protein: 6.3 g/dL — ABNORMAL LOW (ref 6.5–8.1)

## 2023-11-21 LAB — TSH: TSH: 1.765 u[IU]/mL (ref 0.350–4.500)

## 2023-11-21 MED ORDER — HEPARIN SOD (PORK) LOCK FLUSH 100 UNIT/ML IV SOLN
500.0000 [IU] | Freq: Once | INTRAVENOUS | Status: AC
  Administered 2023-11-21: 500 [IU]

## 2023-11-21 MED ORDER — SODIUM CHLORIDE 0.9% FLUSH
10.0000 mL | Freq: Once | INTRAVENOUS | Status: AC
  Administered 2023-11-21: 10 mL

## 2023-11-21 NOTE — Addendum Note (Signed)
 Addended by: Vincent Gros on: 11/21/2023 02:45 PM   Modules accepted: Orders

## 2023-11-21 NOTE — Assessment & Plan Note (Addendum)
 cT4N0M0, stage III -Patient had 1 episode of abdominal pain in October 2023, and was found to have a large gallbladder stone. -She underwent laparoscopic attempted cholecystectomy but was not able to be resected due to adhesions to duodenum and colon. -She developed jaundice in June 2024, status post ERCP and stent placement in [place].  CT and MRI scan showed a liver mass adjacent to the gallbladder fossae, and a biopsy confirmed adenocarcinoma.  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  Unfortunately this was felt to be not resectable by Dr. Freida Busman. -I reviewed her CT chest from April 22, 2023, which showed no evidence of distant metastasis. -I recommended first-line cisplatin, gemcitabine  every 14 days and durvalumab every 28 days.  If she responded well, will stop chemo after 4 to 6 months of treatment, and changed to maintenance therapy. She started on 05/03/23 every 2 weeks and has been tolerating well.  We have also discussed option of consolidation radiation at some point. -restaging CT 07/08/2023 showed partial response in the primary site tumor with direct invasion to liver.  No new disease or question.  Will continue chemotherapy. -case reviewed in GI tumor board, consolidation RT is also an option  -Patient has met with Dr. Mitzi Hansen who feels that RT is bilateral option.  He plans to start radiation on 12/09/2023. -Patient scheduled to start immunotherapy tomorrow, 11/22/2023.  This will be once every 28 days.

## 2023-11-21 NOTE — Progress Notes (Signed)
 Patient Care Team: Shon Hale, MD as PCP - General (Family Medicine) Runell Gess, MD as PCP - Cardiology (Cardiology) Malachy Mood, MD as Consulting Physician (Oncology)  Clinic Day:  11/21/2023  Referring physician: Malachy Mood, MD  ASSESSMENT & PLAN:   Assessment & Plan: Gallbladder cancer (HCC) cT4N0M0, stage III -Patient had 1 episode of abdominal pain in October 2023, and was found to have a large gallbladder stone. -She underwent laparoscopic attempted cholecystectomy but was not able to be resected due to adhesions to duodenum and colon. -She developed jaundice in June 2024, status post ERCP and stent placement in [facility].  CT and MRI scan showed a liver mass adjacent to the gallbladder fossae, and a biopsy confirmed adenocarcinoma.  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  Unfortunately this was felt to be not resectable by Dr. Freida Busman. -I reviewed her CT chest from April 22, 2023, which showed no evidence of distant metastasis. -I recommended first-line cisplatin, gemcitabine  every 14 days and durvalumab every 28 days.  If she responded well, will stop chemo after 4 to 6 months of treatment, and changed to maintenance therapy. She started on 05/03/23 every 2 weeks and has been tolerating well.  We have also discussed option of consolidation radiation at some point. -restaging CT 07/08/2023 showed partial response in the primary site tumor with direct invasion to liver.  No new disease or question.  Will continue chemotherapy. -case reviewed in GI tumor board, consolidation RT is also an option  -Patient has met with Dr. Mitzi Hansen who feels that RT is bilateral option.  He plans to start radiation on 12/09/2023. -Patient scheduled to start immunotherapy tomorrow, 11/22/2023.  This will be once every 28 days.    Moderate anemia D/T chemotherapy Hemoglobin 8.1; hematocrit  26.0. Has required blood transfusions in the past with hemoglobin of 7.9 (10/29/2023). Will add IV iron to her treatment plan for tomorrow 11/22/2023 if possible.    Fatigue This is most likely due to moderate anemia and chemotherapy treatment. Will try to have IV iron added to treatment plan for tomorrow, 11/22/2023.   Recheck labs in 1 week.  Reassess need for treatment at that point.   Abdominal discomfort Likely from ongoing disease process of gallbladder cancer.  Often feels bloated after eating. Recommend trial of OTC Gas-X (simethicone).  Plan: Labs reviewed.  Moderate anemia, slightly worse than 2 weeks ago.  Will treat with IV iron as soon as possible. Mildly reduced renal functions.  Encouraged increased water intake.  Recheck in 1 week. Patient due for initial dose durvalumab tomorrow.  Labs and patient presentation are satisfactory for this treatment.  Plan to proceed. Patient has met with Dr. Mitzi Hansen, radiology oncologist.  Patient to be set up for CT simulation study.  Plan to begin radiation for 5.5 weeks.  Currently the plan is to start 12/09/2023.  The patient understands the plans discussed today and is in agreement with them.  She knows to contact our office if she develops concerns prior to her next appointment.  I provided 30 minutes of face-to-face time during this encounter and > 50% was spent counseling as documented under my assessment and plan.    Carlean Jews, NP  [facility] - A DEPT OF [facility] [address] [place] [address] Dept: [phone] Dept Fax: [phone]  No orders of the defined types were placed in this encounter.     CHIEF COMPLAINT:  CC: Gallbladder cancer  Current Treatment: First-line chemotherapy cisplatin, gemcitabine, and durvalumab.  Tomorrow, she will start immunotherapy with durvalumab maintenance.  INTERVAL HISTORY:  Jaydon is here today for repeat  clinical assessment.  Patient reports negative complaint is fatigue.  States she just does not feel like doing much at all due to level of tiredness.  She does have upper abdominal pain, mostly after eating.  Feels gassy and bloated.  This does resolve after a few hours.  She states that she met with Dr. Mitzi Hansen in radiation.  He does plan to start radiation treatments on 12/09/2023.  She reports she will have radiation for 5.5 weeks.  She denies chest pain, chest pressure, or shortness of breath. She denies headaches or visual disturbances. She denies fevers or chills. She denies pain. Her appetite is fair.  Feels early satiety.  Her weight has decreased 2 pounds over last 2 weeks .  I have reviewed the past medical history, past surgical history, social history and family history with the patient and they are unchanged from previous note.  ALLERGIES:  is allergic to versed [midazolam], augmentin [amoxicillin-pot clavulanate], bactrim [sulfamethoxazole-trimethoprim], demerol [meperidine], meperidine hcl, scopolamine, and tramadol.  MEDICATIONS:  Current Outpatient Medications  Medication Sig Dispense Refill   aspirin EC 81 MG tablet Take 81 mg by mouth at bedtime. Swallow whole.     atorvastatin (LIPITOR) 80 MG tablet Take 1 tablet (80 mg total) by mouth daily. 90 tablet 3   brimonidine (ALPHAGAN) 0.2 % ophthalmic solution Place 1 drop into the right eye 2 (two) times daily.     empagliflozin (JARDIANCE) 25 MG TABS tablet Take 1 tablet (25 mg total) by mouth daily before breakfast. 90 tablet 3   hydrochlorothiazide (HYDRODIURIL) 12.5 MG tablet Take 1 tablet (12.5 mg total) by mouth daily. 90 tablet 0   Insulin Lispro Prot & Lispro (HUMALOG MIX 75/25 KWIKPEN) (75-25) 100 UNIT/ML Kwikpen INJECT 6 UNITS SUBCUTANEOUSLY ONCE DAILY WITH BREAKFAST 15 mL 4   Insulin Pen Needle (ULTRA-THIN II MINI PEN NEEDLE) 31G X 5 MM MISC Use as directed to check blood glucose 50 each 2   Lancets (ONETOUCH DELICA PLUS  LANCET33G) MISC Use to check blood sugars twice daily. (Patient taking differently: Use to check blood sugars  daily.) 100 each 5   MAGNESIUM-OXIDE 400 (240 Mg) MG tablet Take 1 tablet by mouth twice daily 60 tablet 0   metFORMIN (GLUCOPHAGE) 1000 MG tablet Take 1 tablet (1,000 mg total) by mouth 2 (two) times daily. 180 tablet 2   metoprolol tartrate (LOPRESSOR) 50 MG tablet Take 1 tablet by mouth twice daily 60 tablet 0   Multiple Vitamins-Minerals (MULTI FOR HER PO) Take 1 tablet by mouth daily.     mupirocin ointment (BACTROBAN) 2 % Apply 1 Application topically 2 (two) times daily. (Patient taking differently: Apply 1 Application topically 2 (two) times daily as needed (wound care for toes).) 30 g 2   ONETOUCH VERIO test strip USE TO CHECK BLOOD SUGARS ONCE DAILY 100 each 2   pantoprazole (PROTONIX) 40 MG tablet Take 1 tablet by mouth once daily 90 tablet 3   Propylene Glycol (SYSTANE BALANCE) 0.6 % SOLN Place 1 drop into the left eye 3 (three) times daily as needed (dry/irritated eyes).     repaglinide (PRANDIN) 1 MG tablet Take 1 tablet (1 mg total) by mouth 2 (two) times daily before a  meal. Take 1 tab by breakfast and 1 tab before dinner 180 tablet 4   rivaroxaban (XARELTO) 2.5 MG TABS tablet Take 1 tablet by mouth twice daily 60 tablet 11   telmisartan (MICARDIS) 80 MG tablet Take 1 tablet by mouth once daily 30 tablet 0   No current facility-administered medications for this visit.    HISTORY OF PRESENT ILLNESS:   Oncology History Overview Note   Cancer Staging  Gallbladder cancer [facility]) Staging form: Distal Bile Duct, AJCC 8th Edition - Clinical stage from 04/09/2023: Stage IIIB (cT4, cN0, cM0) - Signed by Malachy Mood, MD on 04/18/2023 Total positive nodes: 0 Histologic grade (G): G2 Histologic grading system: 3 grade system     Gallbladder cancer (HCC)  04/04/2023 Imaging    IMPRESSION: 1. Ill-defined hypodensity in the region of the gallbladder fossa appears similar to the  prior study given lack of contrast. This would be better evaluated with dedicated MRI. 2. Nonobstructing right renal calculus. 3. Large amount of stool throughout the colon. 4. Stable indeterminate lucent areas in the vertebral bodies of the lumbar spine. 5. Severe atherosclerotic disease.     04/09/2023 Pathology Results     FINAL MICROSCOPIC DIAGNOSIS:   A. LIVER, ADJACENT TO GALLBLADDER FOSSA, BIOPSY:  - Moderately differentiated adenocarcinoma    04/09/2023 Cancer Staging   Staging form: Distal Bile Duct, AJCC 8th Edition - Clinical stage from 04/09/2023: Stage IIIB (cT4, cN0, cM0) - Signed by Malachy Mood, MD on 04/18/2023 Total positive nodes: 0 Histologic grade (G): G2 Histologic grading system: 3 grade system   04/18/2023 Initial Diagnosis   Cholangiocarcinoma of liver (HCC)   05/03/2023 - 11/08/2023 Chemotherapy   Patient is on Treatment Plan : BILIARY TRACT Cisplatin + Gemcitabine D1,15 + Durvalumab (1500) D1 q28d / Durvalumab (1500) q28d     07/08/2023 Imaging   CT Chest abdomen and pelvis with contrast  IMPRESSION: CT CHEST IMPRESSION  1.  No acute process or evidence of metastatic disease in the chest. 2. Left Port-A-Cath tip in azygous vein. This is being adjusted by interventional radiology later today. 3. Ongoing stability of tiny bilateral pulmonary nodules, favored to be benign. 4.  Aortic Atherosclerosis (ICD10-I70.0).  CT ABDOMEN AND PELVIS IMPRESSION  1. The segment 5 liver lesion is decreased in size compared to the MRI of 04/05/2023. 2. Placement of a biliary stent with improvement, near complete resolution of duct dilatation. 3. No abdominopelvic adenopathy. 4. Proximal gastric underdistention. Apparent wall thickening could be secondary. Correlate with symptoms of gastritis 5. Subtle findings within the superior spleen for which small volume interval splenic infarct cannot be excluded. Correlate with left upper quadrant symptoms. 6.  Possible constipation.      09/27/2023 Imaging   CT chest abdomen and pelvis with contrast  IMPRESSION: CHEST:  1. Stable small pulmonary nodules. 2. No evidence of thoracic metastasis.  PELVIS:  1. Stable hepatic metastasis. 2. No new hepatic lesions. 3. No evidence of metastatic adenopathy in the abdomen pelvis. 4. Intrahepatic and extrahepatic biliary stent in place. No biliary duct dilatation. 5.  Aortic Atherosclerosis (ICD10-I70.0).   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  Experiencing moderate to pretty severe fatigue. Eyes: Denies blurriness of vision Ears, nose, mouth, throat, and face: Denies mucositis or sore throat Respiratory: Denies cough, dyspnea or wheezes Cardiovascular: Denies palpitation,  chest discomfort or lower extremity swelling Gastrointestinal:  Denies nausea, heartburn or change in bowel habits.  Does get an sensation of bloatedness after eating which lasts for couple hours and then resolves. Skin: Denies abnormal skin rashes Lymphatics: Denies new lymphadenopathy or easy bruising Neurological:Denies numbness, tingling or new weaknesses Behavioral/Psych: Mood is stable, no new changes  All other systems were reviewed with the patient and are negative.   VITALS:   Today's Vitals   11/21/23 1041  BP: (!) 136/49  Pulse: 83  Resp: 18  Temp: 97.8 F (36.6 C)  TempSrc: Temporal  SpO2: 99%  Weight: 143 lb 14.4 oz (65.3 kg)   Body mass index is 23.58 kg/m.   Wt Readings from Last 3 Encounters:  11/21/23 143 lb 14.4 oz (65.3 kg)  11/12/23 145 lb 4 oz (65.9 kg)  11/07/23 144 lb 4.8 oz (65.5 kg)    Body mass index is 23.58 kg/m.  Performance status (ECOG): 1 - Symptomatic but completely ambulatory  PHYSICAL EXAM:   GENERAL:alert, no distress and comfortable SKIN: skin color, texture, turgor are normal, no rashes or  significant lesions EYES: normal, Conjunctiva are pink and non-injected, sclera clear OROPHARYNX:no exudate, no erythema and lips, buccal mucosa, and tongue normal  NECK: supple, thyroid normal size, non-tender, without nodularity LYMPH:  no palpable lymphadenopathy in the cervical, axillary or inguinal LUNGS: clear to auscultation and percussion with normal breathing effort HEART: regular rate & rhythm and no murmurs and no lower extremity edema ABDOMEN:abdomen soft, non-tender and normal bowel sounds Musculoskeletal:no cyanosis of digits and no clubbing  NEURO: alert & oriented x 3 with fluent speech, no focal motor/sensory deficits  LABORATORY DATA:  I have reviewed the data as listed    Component Value Date/Time   NA 139 11/21/2023 1018   NA 142 07/29/2020 0942   K 4.4 11/21/2023 1018   CL 104 11/21/2023 1018   CO2 27 11/21/2023 1018   GLUCOSE 185 (H) 11/21/2023 1018   BUN 31 (H) 11/21/2023 1018   BUN 17 07/29/2020 0942   CREATININE 1.21 (H) 11/21/2023 1018   CREATININE 1.24 (H) 09/23/2023 0828   CALCIUM 9.4 11/21/2023 1018   PROT 6.3 (L) 11/21/2023 1018   PROT 6.3 07/28/2020 0945   ALBUMIN 3.5 11/21/2023 1018   ALBUMIN 4.2 07/28/2020 0945   AST 27 11/21/2023 1018   ALT 13 11/21/2023 1018   ALKPHOS 103 11/21/2023 1018   BILITOT 0.8 11/21/2023 1018   GFRNONAA 45 (L) 11/21/2023 1018   GFRAA 75 07/29/2020 0942    Lab Results  Component Value Date   WBC 9.5 11/21/2023   NEUTROABS 7.3 11/21/2023   HGB 8.1 (L) 11/21/2023   HCT 26.0 (L) 11/21/2023   MCV 103.2 (H) 11/21/2023   PLT 182 11/21/2023

## 2023-11-22 ENCOUNTER — Telehealth: Payer: Self-pay | Admitting: Nurse Practitioner

## 2023-11-22 ENCOUNTER — Inpatient Hospital Stay: Payer: Medicare Other

## 2023-11-22 ENCOUNTER — Ambulatory Visit: Payer: Medicare Other

## 2023-11-22 VITALS — BP 152/61 | HR 80 | Temp 97.7°F | Resp 16

## 2023-11-22 DIAGNOSIS — D6481 Anemia due to antineoplastic chemotherapy: Secondary | ICD-10-CM | POA: Diagnosis not present

## 2023-11-22 DIAGNOSIS — C787 Secondary malignant neoplasm of liver and intrahepatic bile duct: Secondary | ICD-10-CM | POA: Diagnosis not present

## 2023-11-22 DIAGNOSIS — Z5112 Encounter for antineoplastic immunotherapy: Secondary | ICD-10-CM | POA: Diagnosis not present

## 2023-11-22 DIAGNOSIS — Z95828 Presence of other vascular implants and grafts: Secondary | ICD-10-CM

## 2023-11-22 DIAGNOSIS — C23 Malignant neoplasm of gallbladder: Secondary | ICD-10-CM | POA: Diagnosis not present

## 2023-11-22 DIAGNOSIS — Z79620 Long term (current) use of immunosuppressive biologic: Secondary | ICD-10-CM | POA: Diagnosis not present

## 2023-11-22 DIAGNOSIS — Z5111 Encounter for antineoplastic chemotherapy: Secondary | ICD-10-CM | POA: Diagnosis not present

## 2023-11-22 LAB — T4: T4, Total: 17.4 ug/dL — ABNORMAL HIGH (ref 4.5–12.0)

## 2023-11-22 MED ORDER — SODIUM CHLORIDE 0.9 % IV SOLN: INTRAVENOUS | Status: DC

## 2023-11-22 MED ORDER — SODIUM CHLORIDE 0.9% FLUSH
10.0000 mL | INTRAVENOUS | Status: DC | PRN
  Administered 2023-11-22: 10 mL

## 2023-11-22 MED ORDER — HEPARIN SOD (PORK) LOCK FLUSH 100 UNIT/ML IV SOLN
500.0000 [IU] | Freq: Once | INTRAVENOUS | Status: AC | PRN
  Administered 2023-11-22: 500 [IU]

## 2023-11-22 MED ORDER — SODIUM CHLORIDE 0.9 % IV SOLN
400.0000 mg | Freq: Once | INTRAVENOUS | Status: AC
  Administered 2023-11-22: 400 mg via INTRAVENOUS
  Filled 2023-11-22: qty 20

## 2023-11-22 MED ORDER — SODIUM CHLORIDE 0.9 % IV SOLN
1500.0000 mg | Freq: Once | INTRAVENOUS | Status: AC
  Administered 2023-11-22: 1500 mg via INTRAVENOUS
  Filled 2023-11-22: qty 30

## 2023-11-22 NOTE — Patient Instructions (Signed)
 [facility] - A DEPT OF [facility]  Discharge Instructions: Thank you for choosing [facility] to provide your oncology and hematology care.   If you have a lab appointment with the Cancer Center, please go directly to the Cancer Center and check in at the registration area.   Wear comfortable clothing and clothing appropriate for easy access to any Portacath or PICC line.   We strive to give you quality time with your provider. You may need to reschedule your appointment if you arrive late (15 or more minutes).  Arriving late affects you and other patients whose appointments are after yours.  Also, if you miss three or more appointments without notifying the office, you may be dismissed from the clinic at the provider's discretion.      For prescription refill requests, have your pharmacy contact our office and allow 72 hours for refills to be completed.    Today you received the following chemotherapy and/or immunotherapy agents: Imfinzi, Venofer      To help prevent nausea and vomiting after your treatment, we encourage you to take your nausea medication as directed.  BELOW ARE SYMPTOMS THAT SHOULD BE REPORTED IMMEDIATELY: *FEVER GREATER THAN 100.4 F (38 C) OR HIGHER *CHILLS OR SWEATING *NAUSEA AND VOMITING THAT IS NOT CONTROLLED WITH YOUR NAUSEA MEDICATION *UNUSUAL SHORTNESS OF BREATH *UNUSUAL BRUISING OR BLEEDING *URINARY PROBLEMS (pain or burning when urinating, or frequent urination) *BOWEL PROBLEMS (unusual diarrhea, constipation, pain near the anus) TENDERNESS IN MOUTH AND THROAT WITH OR WITHOUT PRESENCE OF ULCERS (sore throat, sores in mouth, or a toothache) UNUSUAL RASH, SWELLING OR PAIN  UNUSUAL VAGINAL DISCHARGE OR ITCHING   Items with * indicate a potential emergency and should be followed up as soon as possible or go to the Emergency Department if any problems should occur.  Please show the CHEMOTHERAPY ALERT CARD or  IMMUNOTHERAPY ALERT CARD at check-in to the Emergency Department and triage nurse.  Should you have questions after your visit or need to cancel or reschedule your appointment, please contact [facility] - A DEPT OF [facility]  Dept: [phone]  and follow the prompts.  Office hours are 8:00 a.m. to 4:30 p.m. Monday - Friday. Please note that voicemails left after 4:00 p.m. may not be returned until the following business day.  We are closed weekends and major holidays. You have access to a nurse at all times for urgent questions. Please call the main number to the clinic Dept: [phone] and follow the prompts.   For any non-urgent questions, you may also contact your provider using MyChart. We now offer e-Visits for anyone 85 and older to request care online for non-urgent symptoms. For details visit [email or web address].   Also download the MyChart app! Go to the app store, search "MyChart", open the app, select [facility], and log in with your MyChart username and password.  Durvalumab Injection What is this medication? DURVALUMAB (dur VAL ue mab) treats some types of cancer. It works by helping your immune system slow or stop the spread of cancer cells. It is a monoclonal antibody. This medicine may be used for other purposes; ask your health care provider or pharmacist if you have questions. COMMON BRAND NAME(S): IMFINZI What should I tell my care team before I take this medication? They need to know if you have any of these conditions: Allogeneic stem cell transplant (uses someone else's stem cells)  Autoimmune diseases, such as Crohn disease, ulcerative colitis, lupus History of chest radiation Nervous system problems, such as Guillain-Barre syndrome, myasthenia gravis Organ transplant An unusual or allergic reaction to durvalumab, other medications, foods, dyes, or preservatives Pregnant or trying to get pregnant Breast-feeding How should I use  this medication? This medication is infused into a vein. It is given by your care team in a hospital or clinic setting. A special MedGuide will be given to you before each treatment. Be sure to read this information carefully each time. Talk to your care team about the use of this medication in children. Special care may be needed. Overdosage: If you think you have taken too much of this medicine contact a poison control center or emergency room at once. NOTE: This medicine is only for you. Do not share this medicine with others. What if I miss a dose? Keep appointments for follow-up doses. It is important not to miss your dose. Call your care team if you are unable to keep an appointment. What may interact with this medication? Interactions have not been studied. This list may not describe all possible interactions. Give your health care provider a list of all the medicines, herbs, non-prescription drugs, or dietary supplements you use. Also tell them if you smoke, drink alcohol, or use illegal drugs. Some items may interact with your medicine. What should I watch for while using this medication? Your condition will be monitored carefully while you are receiving this medication. You may need blood work while taking this medication. This medication may cause serious skin reactions. They can happen weeks to months after starting the medication. Contact your care team right away if you notice fevers or flu-like symptoms with a rash. The rash may be red or purple and then turn into blisters or peeling of the skin. You may also notice a red rash with swelling of the face, lips, or lymph nodes in your neck or under your arms. Tell your care team right away if you have any change in your eyesight. Talk to your care team if you may be pregnant. Serious birth defects can occur if you take this medication during pregnancy and for 3 months after the last dose. You will need a negative pregnancy test before  starting this medication. Contraception is recommended while taking this medication and for 3 months after the last dose. Your care team can help you find the option that works for you. Do not breastfeed while taking this medication and for 3 months after the last dose. What side effects may I notice from receiving this medication? Side effects that you should report to your care team as soon as possible: Allergic reactions--skin rash, itching, hives, swelling of the face, lips, tongue, or throat Dry cough, shortness of breath or trouble breathing Eye pain, redness, irritation, or discharge with blurry or decreased vision Heart muscle inflammation--unusual weakness or fatigue, shortness of breath, chest pain, fast or irregular heartbeat, dizziness, swelling of the ankles, feet, or hands Hormone gland problems--headache, sensitivity to light, unusual weakness or fatigue, dizziness, fast or irregular heartbeat, increased sensitivity to cold or heat, excessive sweating, constipation, hair loss, increased thirst or amount of urine, tremors or shaking, irritability Infusion reactions--chest pain, shortness of breath or trouble breathing, feeling faint or lightheaded Kidney injury (glomerulonephritis)--decrease in the amount of urine, red or dark brown urine, foamy or bubbly urine, swelling of the ankles, hands, or feet Liver injury--right upper belly pain, loss of appetite, nausea, light-colored stool, dark yellow  or brown urine, yellowing skin or eyes, unusual weakness or fatigue Pain, tingling, or numbness in the hands or feet, muscle weakness, change in vision, confusion or trouble speaking, loss of balance or coordination, trouble walking, seizures Rash, fever, and swollen lymph nodes Redness, blistering, peeling, or loosening of the skin, including inside the mouth Sudden or severe stomach pain, bloody diarrhea, fever, nausea, vomiting Side effects that usually do not require medical attention  (report these to your care team if they continue or are bothersome): Bone, joint, or muscle pain Diarrhea Fatigue Loss of appetite Nausea Skin rash This list may not describe all possible side effects. Call your doctor for medical advice about side effects. You may report side effects to FDA at 1-800-FDA-1088. Where should I keep my medication? This medication is given in a hospital or clinic. It will not be stored at home. NOTE: This sheet is a summary. It may not cover all possible information. If you have questions about this medicine, talk to your doctor, pharmacist, or health care provider.  2024 Elsevier/Gold Standard (2022-01-23 00:00:00)  Iron Sucrose Injection What is this medication? IRON SUCROSE (EYE ern SOO krose) treats low levels of iron (iron deficiency anemia) in people with kidney disease. Iron is a mineral that plays an important role in making red blood cells, which carry oxygen from your lungs to the rest of your body. This medicine may be used for other purposes; ask your health care provider or pharmacist if you have questions. COMMON BRAND NAME(S): Venofer What should I tell my care team before I take this medication? They need to know if you have any of these conditions: Anemia not caused by low iron levels Heart disease High levels of iron in the blood Kidney disease Liver disease An unusual or allergic reaction to iron, other medications, foods, dyes, or preservatives Pregnant or trying to get pregnant Breastfeeding How should I use this medication? This medication is infused into a vein. It is given by your care team in a hospital or clinic setting. Talk to your care team about the use of this medication in children. While it may be prescribed for children as young as 2 years for selected conditions, precautions do apply. Overdosage: If you think you have taken too much of this medicine contact a poison control center or emergency room at once. NOTE: This  medicine is only for you. Do not share this medicine with others. What if I miss a dose? Keep appointments for follow-up doses. It is important not to miss your dose. Call your care team if you are unable to keep an appointment. What may interact with this medication? Do not take this medication with any of the following: Deferoxamine Dimercaprol Other iron products This medication may also interact with the following: Chloramphenicol Deferasirox This list may not describe all possible interactions. Give your health care provider a list of all the medicines, herbs, non-prescription drugs, or dietary supplements you use. Also tell them if you smoke, drink alcohol, or use illegal drugs. Some items may interact with your medicine. What should I watch for while using this medication? Visit your care team for regular checks on your progress. Tell your care team if your symptoms do not start to get better or if they get worse. You may need blood work done while you are taking this medication. You may need to eat more foods that contain iron. Talk to your care team. Foods that contain iron include whole grains or cereals, dried fruits,  beans, peas, leafy green vegetables, and organ meats (liver, kidney). What side effects may I notice from receiving this medication? Side effects that you should report to your care team as soon as possible: Allergic reactions--skin rash, itching, hives, swelling of the face, lips, tongue, or throat Low blood pressure--dizziness, feeling faint or lightheaded, blurry vision Shortness of breath Side effects that usually do not require medical attention (report to your care team if they continue or are bothersome): Flushing Headache Joint pain Muscle pain Nausea Pain, redness, or irritation at injection site This list may not describe all possible side effects. Call your doctor for medical advice about side effects. You may report side effects to FDA at  1-800-FDA-1088. Where should I keep my medication? This medication is given in a hospital or clinic. It will not be stored at home. NOTE: This sheet is a summary. It may not cover all possible information. If you have questions about this medicine, talk to your doctor, pharmacist, or health care provider.  2024 Elsevier/Gold Standard (2023-05-01 00:00:00)

## 2023-11-22 NOTE — Progress Notes (Signed)
 Patient did very well with her iron infusion today. Stayed for the 30 minute observation with no signs of any reaction. VSS-  BP (!) 152/61 (BP Location: Left Arm, Patient Position: Sitting)   Pulse 80   Temp 97.7 F (36.5 C) (Oral)   Resp 16   SpO2 100%    Ambulatory to the lobby.

## 2023-11-22 NOTE — Telephone Encounter (Signed)
 Left patient a voicemail in regards to scheduled appointment times/dates, also called patient's daughter Elmarie Shiley)  and she stated she would also relay the information about her appointment times/dates as well

## 2023-11-24 ENCOUNTER — Other Ambulatory Visit: Payer: Self-pay | Admitting: Nurse Practitioner

## 2023-11-24 DIAGNOSIS — C23 Malignant neoplasm of gallbladder: Secondary | ICD-10-CM

## 2023-11-24 DIAGNOSIS — D6481 Anemia due to antineoplastic chemotherapy: Secondary | ICD-10-CM

## 2023-11-24 NOTE — Assessment & Plan Note (Signed)
 [id number], stage III -Patient had 1 episode of abdominal pain in October 2023, and was found to have a large gallbladder stone. -She underwent laparoscopic attempted cholecystectomy but was not able to be resected due to adhesions to duodenum and colon. -She developed jaundice in June 2024, status post ERCP and stent placement in [place].  CT and MRI scan showed a liver mass adjacent to the gallbladder fossae, and a biopsy confirmed adenocarcinoma.  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  Unfortunately this was felt to be not resectable by Dr. Freida Busman. -I reviewed her CT chest from April 22, 2023, which showed no evidence of distant metastasis. -I recommended first-line cisplatin, gemcitabine  every 14 days and durvalumab every 28 days.  If she responded well, will stop chemo after 4 to 6 months of treatment, and changed to maintenance therapy. She started on 05/03/23 every 2 weeks and has been tolerating well.  We have also discussed option of consolidation radiation at some point. -restaging CT 07/08/2023 showed partial response in the primary site tumor with direct invasion to liver.  No new disease or question.  Will continue chemotherapy. -case reviewed in GI tumor board, consolidation RT is also an option  -Patient has met with Dr. Mitzi Hansen who feels that RT is viable option.  He plans to start radiation on 12/09/2023. -Dose durvalumab given 11/22/2023.  Patient tolerated very well.  She also received single dose IV iron and tolerated well with improved fatigue.

## 2023-11-24 NOTE — Progress Notes (Unsigned)
 Patient Care Team: Shon Hale, MD as PCP - General (Family Medicine) Runell Gess, MD as PCP - Cardiology (Cardiology) Malachy Mood, MD as Consulting Physician (Oncology)  Clinic Day:  11/28/2023  Referring physician: Shon Hale, *  ASSESSMENT & PLAN:   Assessment & Plan: Gallbladder cancer (HCC) cT4N0M0, stage III -Patient had 1 episode of abdominal pain in October 2023, and was found to have a large gallbladder stone. -She underwent laparoscopic attempted cholecystectomy but was not able to be resected due to adhesions to duodenum and colon. -She developed jaundice in June 2024, status post ERCP and stent placement in CBD.  CT and MRI scan showed a liver mass adjacent to the gallbladder fossae, and a biopsy confirmed adenocarcinoma.  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  Unfortunately this was felt to be not resectable by Dr. Freida Busman. -I reviewed her CT chest from April 22, 2023, which showed no evidence of distant metastasis. -I recommended first-line cisplatin, gemcitabine  every 14 days and durvalumab every 28 days.  If she responded well, will stop chemo after 4 to 6 months of treatment, and changed to maintenance therapy. She started on 05/03/23 every 2 weeks and has been tolerating well.  We have also discussed option of consolidation radiation at some point. -restaging CT 07/08/2023 showed partial response in the primary site tumor with direct invasion to liver.  No new disease or question.  Will continue chemotherapy. -case reviewed in GI tumor board, consolidation RT is also an option  -Patient has met with Dr. Mitzi Hansen who feels that RT is viable option.  He plans to start radiation on 12/09/2023. -Dose durvalumab given 11/22/2023.  Patient tolerated very well.  She also received single dose IV iron and tolerated well with improved fatigue.    Iron  deficiency anemia She received 1 dose IV iron last week with durvalumab.  States it has improved her fatigue, at least a little bit. Hemoglobin has improved some to 8.5, from 8.1.  Hematocrit 27.0. Will arrange for IV iron again if ferritin >/= sign 100  Plan Labs reviewed.  CBC and CMP both improved since last week. Will arrange for additional dose IV iron next week if needed. -Ferritin level pending. Will reach out to radiation oncology this patient has not heard yet about appointment for CT simulation for radiation which was projected to begin on around 12/09/2023. Labs/flush, follow-up with Dr. Mosetta Putt, and durvalumab on 12/19/2023 as scheduled.  The patient understands the plans discussed today and is in agreement with them.  She knows to contact our office if she develops concerns prior to her next appointment.  I provided 20 minutes of face-to-face time during this encounter and > 50% was spent counseling as documented under my assessment and plan.    Carlean Jews, NP  [facility] - A DEPT OF [facility] [address] [place] [address] Dept: [phone] Dept Fax: [phone]   No orders of the defined types were placed in this encounter.     CHIEF COMPLAINT:  CC: Gallbladder cancer  Current Treatment: Radiation starting 12/09/2023.  Durvalumab started 11/22/2023.  INTERVAL HISTORY:  [name] is here today for repeat clinical assessment.  She was last seen by myself on 11/21/2023.  Labs were adequate to start durvalumab however, she did have worsening iron deficiency  anemia and  slightly reduced renal functions.  Was to receive IV iron with initial treatment of durvalumab.  She states she has felt better with new treatment.  Getting less fluid is more tolerable.  Feels like IV iron has increased energy levels.  She does have occasional discomfort in right flank area which is worse when sitting.  Standing up and  lying flat improves the pain.  She mentions she had a carotid Doppler study yesterday with her vascular surgeon.  Was told there were no changes since last year.  Blood pressure slightly elevated this morning.  Patient states she has not taken morning medications yet.  She has no new concerns or complaints.  She denies chest pain, chest pressure, or shortness of breath. She denies headaches or visual disturbances. She denies abdominal pain, nausea, vomiting, or changes in bowel or bladder habits.  She reports eating a lot of fruit.  Has found she likes prunes.  Eating prunes has limited her need for OTC MiraLAX.  She denies fevers or chills. She denies pain. Her appetite is good. Her weight has decreased 2 pounds over last week .  I have reviewed the past medical history, past surgical history, social history and family history with the patient and they are unchanged from previous note.  ALLERGIES:  is allergic to versed [midazolam], augmentin [amoxicillin-pot clavulanate], bactrim [sulfamethoxazole-trimethoprim], demerol [meperidine], meperidine hcl, scopolamine, and tramadol.  MEDICATIONS:  Current Outpatient Medications  Medication Sig Dispense Refill   aspirin EC 81 MG tablet Take 81 mg by mouth at bedtime. Swallow whole.     atorvastatin (LIPITOR) 80 MG tablet Take 1 tablet (80 mg total) by mouth daily. 90 tablet 3   brimonidine (ALPHAGAN) 0.2 % ophthalmic solution Place 1 drop into the right eye 2 (two) times daily.     empagliflozin (JARDIANCE) 25 MG TABS tablet Take 1 tablet (25 mg total) by mouth daily before breakfast. 90 tablet 3   hydrochlorothiazide (HYDRODIURIL) 12.5 MG tablet Take 1 tablet (12.5 mg total) by mouth daily. 90 tablet 0   Insulin Lispro Prot & Lispro (HUMALOG MIX 75/25 KWIKPEN) (75-25) 100 UNIT/ML Kwikpen INJECT 6 UNITS SUBCUTANEOUSLY ONCE DAILY WITH BREAKFAST 15 mL 4   Insulin Pen Needle (ULTRA-THIN II MINI PEN NEEDLE) 31G X 5 MM MISC Use as directed to check blood glucose  50 each 2   Lancets (ONETOUCH DELICA PLUS LANCET33G) MISC Use to check blood sugars twice daily. (Patient taking differently: Use to check blood sugars  daily.) 100 each 5   MAGNESIUM-OXIDE 400 (240 Mg) MG tablet Take 1 tablet by mouth twice daily 60 tablet 0   metFORMIN (GLUCOPHAGE) 1000 MG tablet Take 1 tablet (1,000 mg total) by mouth 2 (two) times daily. 180 tablet 2   metoprolol tartrate (LOPRESSOR) 50 MG tablet Take 1 tablet by mouth twice daily 60 tablet 0   Multiple Vitamins-Minerals (MULTI FOR HER PO) Take 1 tablet by mouth daily.     mupirocin ointment (BACTROBAN) 2 % Apply 1 Application topically 2 (two) times daily. (Patient taking differently: Apply 1 Application topically 2 (two) times daily as needed (wound care for toes).) 30 g 2   ONETOUCH VERIO test strip USE TO CHECK BLOOD SUGARS ONCE DAILY 100 each 2   pantoprazole (PROTONIX) 40 MG tablet Take 1 tablet by mouth once daily 90 tablet 3   Propylene Glycol (SYSTANE BALANCE) 0.6 % SOLN Place 1 drop into the left eye 3 (three) times daily as needed (dry/irritated eyes).     repaglinide (PRANDIN)  1 MG tablet Take 1 tablet (1 mg total) by mouth 2 (two) times daily before a meal. Take 1 tab by breakfast and 1 tab before dinner 180 tablet 4   rivaroxaban (XARELTO) 2.5 MG TABS tablet Take 1 tablet by mouth twice daily 60 tablet 11   telmisartan (MICARDIS) 80 MG tablet Take 1 tablet by mouth once daily 30 tablet 0   No current facility-administered medications for this visit.    HISTORY OF PRESENT ILLNESS:   Oncology History Overview Note   Cancer Staging  Gallbladder cancer [facility]) Staging form: Distal Bile Duct, AJCC 8th Edition - Clinical stage from 04/09/2023: Stage IIIB (cT4, cN0, cM0) - Signed by Malachy Mood, MD on 04/18/2023 Total positive nodes: 0 Histologic grade (G): G2 Histologic grading system: 3 grade system     Gallbladder cancer (HCC)  04/04/2023 Imaging    IMPRESSION: 1. Ill-defined hypodensity in the region of the  gallbladder fossa appears similar to the prior study given lack of contrast. This would be better evaluated with dedicated MRI. 2. Nonobstructing right renal calculus. 3. Large amount of stool throughout the colon. 4. Stable indeterminate lucent areas in the vertebral bodies of the lumbar spine. 5. Severe atherosclerotic disease.     04/09/2023 Pathology Results     FINAL MICROSCOPIC DIAGNOSIS:   A. LIVER, ADJACENT TO GALLBLADDER FOSSA, BIOPSY:  - Moderately differentiated adenocarcinoma    04/09/2023 Cancer Staging   Staging form: Distal Bile Duct, AJCC 8th Edition - Clinical stage from 04/09/2023: Stage IIIB (cT4, cN0, cM0) - Signed by Malachy Mood, MD on 04/18/2023 Total positive nodes: 0 Histologic grade (G): G2 Histologic grading system: 3 grade system   04/18/2023 Initial Diagnosis   Cholangiocarcinoma of liver (HCC)   05/03/2023 - 11/08/2023 Chemotherapy   Patient is on Treatment Plan : BILIARY TRACT Cisplatin + Gemcitabine D1,15 + Durvalumab (1500) D1 q28d / Durvalumab (1500) q28d     07/08/2023 Imaging   CT Chest abdomen and pelvis with contrast  IMPRESSION: CT CHEST IMPRESSION  1.  No acute process or evidence of metastatic disease in the chest. 2. Left Port-A-Cath tip in azygous vein. This is being adjusted by interventional radiology later today. 3. Ongoing stability of tiny bilateral pulmonary nodules, favored to be benign. 4.  Aortic Atherosclerosis (ICD10-I70.0).  CT ABDOMEN AND PELVIS IMPRESSION  1. The segment 5 liver lesion is decreased in size compared to the MRI of 04/05/2023. 2. Placement of a biliary stent with improvement, near complete resolution of duct dilatation. 3. No abdominopelvic adenopathy. 4. Proximal gastric underdistention. Apparent wall thickening could be secondary. Correlate with symptoms of gastritis 5. Subtle findings within the superior spleen for which small volume interval splenic infarct cannot be excluded. Correlate with left upper  quadrant symptoms. 6.  Possible constipation.     09/27/2023 Imaging   CT chest abdomen and pelvis with contrast  IMPRESSION: CHEST:  1. Stable small pulmonary nodules. 2. No evidence of thoracic metastasis.  PELVIS:  1. Stable hepatic metastasis. 2. No new hepatic lesions. 3. No evidence of metastatic adenopathy in the abdomen pelvis. 4. Intrahepatic and extrahepatic biliary stent in place. No biliary duct dilatation. 5.  Aortic Atherosclerosis (ICD10-I70.0).   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  Does have right flank pain which has been present since gallbladder surgery several months ago.  This is occasional and worse when sitting down. Skin: Denies abnormal skin rashes Lymphatics: Denies new lymphadenopathy or easy bruising Neurological:Denies numbness, tingling or new weaknesses Behavioral/Psych: Mood is stable, no new changes  All other systems were reviewed with the patient and are negative.   VITALS:   Today's Vitals   11/28/23 0843  BP: (!) 150/60  Pulse: 100  Resp: 20  Temp: 98.3 F (36.8 C)  TempSrc: Temporal  SpO2: 99%  Weight: 141 lb 8 oz (64.2 kg)  Height: 5' 5.5" (1.664 m)  PainSc: 0-No pain   Body mass index is 23.19 kg/m.   Wt Readings from Last 3 Encounters:  11/28/23 141 lb 8 oz (64.2 kg)  11/21/23 143 lb 14.4 oz (65.3 kg)  11/12/23 145 lb 4 oz (65.9 kg)    Body mass index is 23.19 kg/m.  Performance status (ECOG): 1 - Symptomatic but completely ambulatory  PHYSICAL EXAM:   GENERAL:alert, no  distress and comfortable SKIN: skin color, texture, turgor are normal, no rashes or significant lesions EYES: normal, Conjunctiva are pink and non-injected, sclera clear OROPHARYNX:no exudate, no erythema and lips, buccal mucosa, and tongue normal  NECK: supple, thyroid normal size, non-tender, without nodularity LYMPH:  no palpable lymphadenopathy in the cervical, axillary or inguinal LUNGS: clear to auscultation and percussion with normal breathing effort HEART: regular rate & rhythm and no murmurs and no lower extremity edema ABDOMEN:abdomen soft, non-tender and normal bowel sounds Musculoskeletal:no cyanosis of digits and no clubbing.  No palpable masses or bony abnormalities appreciated on today's exam. NEURO: alert & oriented x 3 with fluent speech, no focal motor/sensory deficits  LABORATORY DATA:  I have reviewed the data as listed    Component Value Date/Time   NA 138 11/28/2023 0819   NA 142 07/29/2020 0942   K 4.2 11/28/2023 0819   CL 103 11/28/2023 0819   CO2 29 11/28/2023 0819   GLUCOSE 174 (H) 11/28/2023 0819   BUN 23 11/28/2023 0819   BUN 17 07/29/2020 0942   CREATININE 1.09 (H) 11/28/2023 0819   CREATININE 1.24 (H) 09/23/2023 0828   CALCIUM 9.2 11/28/2023 0819   PROT 6.1 (L) 11/28/2023 0819   PROT 6.3 07/28/2020 0945   ALBUMIN 3.4 (L) 11/28/2023 0819   ALBUMIN 4.2 07/28/2020 0945   AST 27 11/28/2023 0819   ALT 11 11/28/2023 0819   ALKPHOS 84 11/28/2023 0819   BILITOT 0.8 11/28/2023 0819   GFRNONAA 51 (L) 11/28/2023 0819   GFRAA 75 07/29/2020 0942    Lab Results  Component Value Date   WBC 9.5 11/28/2023   NEUTROABS 6.3 11/28/2023   HGB 8.5 (L) 11/28/2023   HCT 27.0 (L) 11/28/2023   MCV 105.1 (H) 11/28/2023   PLT 345 11/28/2023   Iron/TIBC/Ferritin/ %Sat    Component Value Date/Time   IRON 61 04/06/2023 0613   TIBC 287 04/06/2023 0613   FERRITIN 199 04/06/2023 0613   IRONPCTSAT 21 04/06/2023 3557

## 2023-11-27 ENCOUNTER — Ambulatory Visit (HOSPITAL_COMMUNITY)
Admission: RE | Admit: 2023-11-27 | Discharge: 2023-11-27 | Disposition: A | Discharge status: Home / Self Care | Payer: Medicare Other | Source: Ambulatory Visit | Attending: Cardiovascular Disease | Admitting: Cardiovascular Disease

## 2023-11-27 DIAGNOSIS — I6529 Occlusion and stenosis of unspecified carotid artery: Secondary | ICD-10-CM | POA: Insufficient documentation

## 2023-11-27 DIAGNOSIS — I6521 Occlusion and stenosis of right carotid artery: Secondary | ICD-10-CM | POA: Diagnosis not present

## 2023-11-28 ENCOUNTER — Inpatient Hospital Stay (HOSPITAL_BASED_OUTPATIENT_CLINIC_OR_DEPARTMENT_OTHER): Payer: Medicare Other | Admitting: Nurse Practitioner

## 2023-11-28 ENCOUNTER — Inpatient Hospital Stay: Payer: Medicare Other

## 2023-11-28 ENCOUNTER — Encounter: Payer: Self-pay | Admitting: Nurse Practitioner

## 2023-11-28 VITALS — BP 150/60 | HR 100 | Temp 98.3°F | Resp 20 | Ht 65.5 in | Wt 141.5 lb

## 2023-11-28 DIAGNOSIS — Z510 Encounter for antineoplastic radiation therapy: Secondary | ICD-10-CM | POA: Diagnosis present

## 2023-11-28 DIAGNOSIS — Z5112 Encounter for antineoplastic immunotherapy: Secondary | ICD-10-CM | POA: Insufficient documentation

## 2023-11-28 DIAGNOSIS — D509 Iron deficiency anemia, unspecified: Secondary | ICD-10-CM | POA: Insufficient documentation

## 2023-11-28 DIAGNOSIS — C787 Secondary malignant neoplasm of liver and intrahepatic bile duct: Secondary | ICD-10-CM | POA: Insufficient documentation

## 2023-11-28 DIAGNOSIS — C23 Malignant neoplasm of gallbladder: Secondary | ICD-10-CM | POA: Insufficient documentation

## 2023-11-28 DIAGNOSIS — Z452 Encounter for adjustment and management of vascular access device: Secondary | ICD-10-CM | POA: Insufficient documentation

## 2023-11-28 DIAGNOSIS — D6481 Anemia due to antineoplastic chemotherapy: Secondary | ICD-10-CM

## 2023-11-28 DIAGNOSIS — Z95828 Presence of other vascular implants and grafts: Secondary | ICD-10-CM

## 2023-11-28 LAB — CBC WITH DIFFERENTIAL (CANCER CENTER ONLY)
Abs Immature Granulocytes: 0.04 K/uL (ref 0.00–0.07)
Basophils Absolute: 0.1 K/uL (ref 0.0–0.1)
Basophils Relative: 1 %
Eosinophils Absolute: 0.2 K/uL (ref 0.0–0.5)
Eosinophils Relative: 2 %
HCT: 27 % — ABNORMAL LOW (ref 36.0–46.0)
Hemoglobin: 8.5 g/dL — ABNORMAL LOW (ref 12.0–15.0)
Immature Granulocytes: 0 %
Lymphocytes Relative: 13 %
Lymphs Abs: 1.2 K/uL (ref 0.7–4.0)
MCH: 33.1 pg (ref 26.0–34.0)
MCHC: 31.5 g/dL (ref 30.0–36.0)
MCV: 105.1 fL — ABNORMAL HIGH (ref 80.0–100.0)
Monocytes Absolute: 1.7 K/uL — ABNORMAL HIGH (ref 0.1–1.0)
Monocytes Relative: 17 %
Neutro Abs: 6.3 K/uL (ref 1.7–7.7)
Neutrophils Relative %: 67 %
Platelet Count: 345 K/uL (ref 150–400)
RBC: 2.57 MIL/uL — ABNORMAL LOW (ref 3.87–5.11)
RDW: 22.8 % — ABNORMAL HIGH (ref 11.5–15.5)
WBC Count: 9.5 K/uL (ref 4.0–10.5)
nRBC: 0 % (ref 0.0–0.2)

## 2023-11-28 LAB — CMP (CANCER CENTER ONLY)
ALT: 11 U/L (ref 0–44)
AST: 27 U/L (ref 15–41)
Albumin: 3.4 g/dL — ABNORMAL LOW (ref 3.5–5.0)
Alkaline Phosphatase: 84 U/L (ref 38–126)
Anion gap: 6 (ref 5–15)
BUN: 23 mg/dL (ref 8–23)
CO2: 29 mmol/L (ref 22–32)
Calcium: 9.2 mg/dL (ref 8.9–10.3)
Chloride: 103 mmol/L (ref 98–111)
Creatinine: 1.09 mg/dL — ABNORMAL HIGH (ref 0.44–1.00)
GFR, Estimated: 51 mL/min — ABNORMAL LOW (ref >60)
Glucose, Bld: 174 mg/dL — ABNORMAL HIGH (ref 70–99)
Potassium: 4.2 mmol/L (ref 3.5–5.1)
Sodium: 138 mmol/L (ref 135–145)
Total Bilirubin: 0.8 mg/dL (ref 0.0–1.2)
Total Protein: 6.1 g/dL — ABNORMAL LOW (ref 6.5–8.1)

## 2023-11-28 LAB — FERRITIN: Ferritin: 355 ng/mL — ABNORMAL HIGH (ref 11–307)

## 2023-11-28 MED ORDER — HEPARIN SOD (PORK) LOCK FLUSH 100 UNIT/ML IV SOLN
500.0000 [IU] | Freq: Once | INTRAVENOUS | Status: AC
  Administered 2023-11-28: 500 [IU]

## 2023-11-28 MED ORDER — SODIUM CHLORIDE 0.9% FLUSH
10.0000 mL | Freq: Once | INTRAVENOUS | Status: AC
  Administered 2023-11-28: 10 mL

## 2023-11-29 ENCOUNTER — Ambulatory Visit
Admission: RE | Admit: 2023-11-29 | Discharge: 2023-11-29 | Disposition: A | Discharge status: Home / Self Care | Source: Ambulatory Visit | Attending: Radiation Oncology | Admitting: Radiation Oncology

## 2023-11-29 DIAGNOSIS — C787 Secondary malignant neoplasm of liver and intrahepatic bile duct: Secondary | ICD-10-CM | POA: Diagnosis not present

## 2023-11-29 DIAGNOSIS — D509 Iron deficiency anemia, unspecified: Secondary | ICD-10-CM | POA: Diagnosis not present

## 2023-11-29 DIAGNOSIS — Z510 Encounter for antineoplastic radiation therapy: Secondary | ICD-10-CM | POA: Diagnosis not present

## 2023-11-29 DIAGNOSIS — Z5112 Encounter for antineoplastic immunotherapy: Secondary | ICD-10-CM | POA: Diagnosis not present

## 2023-11-29 DIAGNOSIS — C23 Malignant neoplasm of gallbladder: Secondary | ICD-10-CM | POA: Insufficient documentation

## 2023-11-29 DIAGNOSIS — Z452 Encounter for adjustment and management of vascular access device: Secondary | ICD-10-CM | POA: Diagnosis not present

## 2023-12-05 ENCOUNTER — Ambulatory Visit: Payer: Medicare Other | Admitting: Hematology

## 2023-12-05 ENCOUNTER — Other Ambulatory Visit: Payer: Medicare Other

## 2023-12-06 ENCOUNTER — Ambulatory Visit: Payer: Medicare Other

## 2023-12-06 DIAGNOSIS — Z452 Encounter for adjustment and management of vascular access device: Secondary | ICD-10-CM | POA: Diagnosis not present

## 2023-12-06 DIAGNOSIS — Z510 Encounter for antineoplastic radiation therapy: Secondary | ICD-10-CM | POA: Diagnosis not present

## 2023-12-06 DIAGNOSIS — D509 Iron deficiency anemia, unspecified: Secondary | ICD-10-CM | POA: Diagnosis not present

## 2023-12-06 DIAGNOSIS — C787 Secondary malignant neoplasm of liver and intrahepatic bile duct: Secondary | ICD-10-CM | POA: Diagnosis not present

## 2023-12-06 DIAGNOSIS — C23 Malignant neoplasm of gallbladder: Secondary | ICD-10-CM | POA: Diagnosis not present

## 2023-12-06 DIAGNOSIS — Z5112 Encounter for antineoplastic immunotherapy: Secondary | ICD-10-CM | POA: Diagnosis not present

## 2023-12-09 ENCOUNTER — Telehealth: Payer: Self-pay | Admitting: Pharmacist

## 2023-12-09 ENCOUNTER — Telehealth: Payer: Self-pay | Admitting: Hematology

## 2023-12-09 ENCOUNTER — Other Ambulatory Visit (HOSPITAL_COMMUNITY): Payer: Self-pay

## 2023-12-09 ENCOUNTER — Other Ambulatory Visit: Payer: Self-pay | Admitting: Pharmacy Technician

## 2023-12-09 ENCOUNTER — Ambulatory Visit
Admission: RE | Admit: 2023-12-09 | Discharge: 2023-12-09 | Disposition: A | Discharge status: Home / Self Care | Source: Ambulatory Visit | Attending: Radiation Oncology | Admitting: Radiation Oncology

## 2023-12-09 ENCOUNTER — Other Ambulatory Visit: Payer: Self-pay

## 2023-12-09 ENCOUNTER — Encounter: Payer: Self-pay | Admitting: Hematology

## 2023-12-09 ENCOUNTER — Telehealth: Payer: Self-pay | Admitting: Pharmacy Technician

## 2023-12-09 ENCOUNTER — Other Ambulatory Visit: Payer: Self-pay | Admitting: Hematology

## 2023-12-09 DIAGNOSIS — Z452 Encounter for adjustment and management of vascular access device: Secondary | ICD-10-CM | POA: Diagnosis not present

## 2023-12-09 DIAGNOSIS — D509 Iron deficiency anemia, unspecified: Secondary | ICD-10-CM | POA: Diagnosis not present

## 2023-12-09 DIAGNOSIS — Z510 Encounter for antineoplastic radiation therapy: Secondary | ICD-10-CM | POA: Diagnosis not present

## 2023-12-09 DIAGNOSIS — C23 Malignant neoplasm of gallbladder: Secondary | ICD-10-CM | POA: Diagnosis not present

## 2023-12-09 DIAGNOSIS — Z5112 Encounter for antineoplastic immunotherapy: Secondary | ICD-10-CM | POA: Diagnosis not present

## 2023-12-09 DIAGNOSIS — C787 Secondary malignant neoplasm of liver and intrahepatic bile duct: Secondary | ICD-10-CM | POA: Diagnosis not present

## 2023-12-09 LAB — RAD ONC ARIA SESSION SUMMARY
Course Elapsed Days: 0
Plan Fractions Treated to Date: 1
Plan Prescribed Dose Per Fraction: 1.8 Gy
Plan Total Fractions Prescribed: 25
Plan Total Prescribed Dose: 45 Gy
Reference Point Dosage Given to Date: 1.8 Gy
Reference Point Session Dosage Given: 1.8 Gy
Session Number: 1

## 2023-12-09 MED ORDER — CAPECITABINE 500 MG PO TABS
ORAL_TABLET | ORAL | 1 refills | Status: DC
Start: 2023-12-09 — End: 2023-12-09

## 2023-12-09 MED ORDER — CAPECITABINE 500 MG PO TABS
ORAL_TABLET | ORAL | 1 refills | Status: DC
  Filled 2023-12-09: qty 60, 15d supply, fill #0
  Filled 2023-12-23: qty 60, 15d supply, fill #1

## 2023-12-09 NOTE — Telephone Encounter (Signed)
 Oral Oncology Pharmacist Encounter  Received new prescription for Xeloda (capecitabine) for the treatment of gallbladder cancer in conjunction with radiation, planned duration 5 1/2 weeks.  CBC w/ Diff and CMP from 11/28/23 assessed, noted Scr 1.09 mg/dL (CrCl ~16 mL/min) - will confirm with MD if she plans on dose reducing 25% to account for renal dysfunction. Patient with Hgb 8.5 g/dL (appears baseline for patient). Prescription dose and frequency assessed for appropriateness.  ADDENDUM 12/09/2023 3:34 PM Confirmed with Dr. Mosetta Putt - OK to dose reduce patient to 1000 mg BID due to renal dysfunction. Prescription has been updated to reflect changes.   Current medication list in Epic reviewed, DDIs with Xeloda identified: Category C DDI between Xeloda and Pantoprazole - proton-pump inhibitors can decrease efficacy of Xeloda - will discuss with patient alternatives to pantoprazole, such as H2RA's like famotidine while on Xeloda.  Evaluated chart and no patient barriers to medication adherence noted.   Prescription has been e-scribed to the [facility] for benefits analysis and approval.  Oral Oncology Clinic will continue to follow for insurance authorization, copayment issues, initial counseling and start date.  Sherry Ruffing, PharmD, BCPS, BCOP Hematology/Oncology Clinical Pharmacist [name] and [facility] Oral Chemotherapy Navigation Clinics [phone] 12/09/2023 2:49 PM

## 2023-12-09 NOTE — Progress Notes (Signed)
 Specialty Pharmacy Initial Fill Coordination Note  [name] is a 83 y.o. female contacted today regarding refills of specialty medication(s) Capecitabine (XELODA) .  Patient requested Daryll Drown at [facility] at [place]  on 12/09/23   Medication will be filled on 12/09/23.   Patient is aware of $0 copayment.

## 2023-12-09 NOTE — Telephone Encounter (Signed)
 Oral Oncology Patient Advocate Encounter  After completing a benefits investigation, prior authorization for capecitabine is not required at this time through Medicare B.  Patient's copay is $0.     [name], CPhT-Adv Oncology Pharmacy Patient Advocate [facility]  Direct Number: [phone]  Fax: [phone]

## 2023-12-09 NOTE — Progress Notes (Signed)
 Oral Chemotherapy Pharmacist Encounter  Patient was counseled under telephone encounter from 12/09/23.  Sherry Ruffing, PharmD, BCPS, BCOP Hematology/Oncology Clinical Pharmacist [name] and [facility] Oral Chemotherapy Navigation Clinics [phone] 12/09/2023 3:55 PM

## 2023-12-09 NOTE — Telephone Encounter (Signed)
 Patient is aware of scheduled appointment; patient has callback number for scheduling line if needing to cancel or reschedule appointments

## 2023-12-09 NOTE — Telephone Encounter (Signed)
 Oral Chemotherapy Pharmacist Encounter  I spoke with patient for overview of: Xeloda (capecitabine) for the treatment of gallbladder cancer in conjunction with radiation, planned duration 5 1/2 weeks.   Counseled patient on administration, dosing, side effects, monitoring, drug-food interactions, safe handling, storage, and disposal.  Patient will take Xeloda 500mg  tablets, 2 tablets (1000mg ) by mouth in AM and 2 tabs (1000mg ) by mouth in PM, within 30 minutes of finishing meals, on days of radiation only.  Xeloda start date: 12/09/23 PM Radiation start date: 12/09/23  Adverse effects of Xeloda include but are not limited to: fatigue, decreased blood counts, GI upset, diarrhea, mouth sores, and hand-foot syndrome. Hand-foot syndrome: discussed use of cream such as Udderly Smooth Extra Care 20 or equivalent advanced care cream that has 20% urea content for advanced skin hydration while on Xeloda. Additionally discussed use of OTC Voltaren gel for HFS prophylaxis. Discussed recommended use of Voltaren gel is 1 finger tip application for front/backside of hands and then 1 fingertip application to bottoms of feet twice a day for up to 12 weeks.  Diarrhea: Patient will obtain Imodium (loperamide) to have on hand if they experience diarrhea. Patient knows to alert the office of 4 or more loose stools above baseline.  Reviewed with patient importance of keeping a medication schedule and plan for any missed doses. No barriers to medication adherence identified.  Medication reconciliation performed and medication/allergy list updated. Pantoprazole - patient educated that PPIs like pantoprazole can decrease efficacy of Xeloda. Patient OK with holding pantoprazole while she is on Xeloda and utilizing Pepcid (famotidine) instead.   All questions answered.  Ms. Freeberg voiced understanding and appreciation.   Medication education handout placed in mail for patient. Patient knows to call the office with  questions or concerns. Oral Chemotherapy Clinic phone number provided to patient.   Sherry Ruffing, PharmD, BCPS, BCOP Hematology/Oncology Clinical Pharmacist [name] and [facility] Oral Chemotherapy Navigation Clinics [phone] 12/09/2023 3:56 PM

## 2023-12-09 NOTE — Progress Notes (Signed)
 Per Secure Chat conversation dated 12/09/2023 and verbal discussion w/Dr. Mosetta Putt, pt is on ChemoRT + Immunotherapy (Capecitabine & Durvalumab and Radiation).  Radiation started on today 12/09/2023 but pt will start Capecitabine once insurance approves and pt receives the medication from the pharmacy.  Sherry Ruffing will give pt a call to do education and pt will come in on 12/12/2023 for lab & OV w/Dr. Mosetta Putt.

## 2023-12-10 ENCOUNTER — Ambulatory Visit
Admission: RE | Admit: 2023-12-10 | Discharge: 2023-12-10 | Disposition: A | Discharge status: Home / Self Care | Source: Ambulatory Visit | Attending: Radiation Oncology | Admitting: Radiation Oncology

## 2023-12-10 ENCOUNTER — Other Ambulatory Visit: Payer: Self-pay

## 2023-12-10 DIAGNOSIS — D509 Iron deficiency anemia, unspecified: Secondary | ICD-10-CM | POA: Diagnosis not present

## 2023-12-10 DIAGNOSIS — C23 Malignant neoplasm of gallbladder: Secondary | ICD-10-CM | POA: Diagnosis not present

## 2023-12-10 DIAGNOSIS — Z5112 Encounter for antineoplastic immunotherapy: Secondary | ICD-10-CM | POA: Diagnosis not present

## 2023-12-10 DIAGNOSIS — Z510 Encounter for antineoplastic radiation therapy: Secondary | ICD-10-CM | POA: Diagnosis not present

## 2023-12-10 DIAGNOSIS — Z452 Encounter for adjustment and management of vascular access device: Secondary | ICD-10-CM | POA: Diagnosis not present

## 2023-12-10 DIAGNOSIS — C787 Secondary malignant neoplasm of liver and intrahepatic bile duct: Secondary | ICD-10-CM | POA: Diagnosis not present

## 2023-12-10 LAB — RAD ONC ARIA SESSION SUMMARY
Course Elapsed Days: 1
Plan Fractions Treated to Date: 2
Plan Prescribed Dose Per Fraction: 1.8 Gy
Plan Total Fractions Prescribed: 25
Plan Total Prescribed Dose: 45 Gy
Reference Point Dosage Given to Date: 3.6 Gy
Reference Point Session Dosage Given: 1.8 Gy
Session Number: 2

## 2023-12-11 ENCOUNTER — Other Ambulatory Visit: Payer: Self-pay

## 2023-12-11 ENCOUNTER — Ambulatory Visit
Admission: RE | Admit: 2023-12-11 | Discharge: 2023-12-11 | Disposition: A | Discharge status: Home / Self Care | Source: Ambulatory Visit | Attending: Radiation Oncology | Admitting: Radiation Oncology

## 2023-12-11 DIAGNOSIS — C23 Malignant neoplasm of gallbladder: Secondary | ICD-10-CM

## 2023-12-11 DIAGNOSIS — C787 Secondary malignant neoplasm of liver and intrahepatic bile duct: Secondary | ICD-10-CM | POA: Diagnosis not present

## 2023-12-11 DIAGNOSIS — Z452 Encounter for adjustment and management of vascular access device: Secondary | ICD-10-CM | POA: Diagnosis not present

## 2023-12-11 DIAGNOSIS — Z5112 Encounter for antineoplastic immunotherapy: Secondary | ICD-10-CM | POA: Diagnosis not present

## 2023-12-11 DIAGNOSIS — D509 Iron deficiency anemia, unspecified: Secondary | ICD-10-CM | POA: Diagnosis not present

## 2023-12-11 DIAGNOSIS — Z510 Encounter for antineoplastic radiation therapy: Secondary | ICD-10-CM | POA: Diagnosis not present

## 2023-12-11 LAB — RAD ONC ARIA SESSION SUMMARY
Course Elapsed Days: 2
Plan Fractions Treated to Date: 3
Plan Prescribed Dose Per Fraction: 1.8 Gy
Plan Total Fractions Prescribed: 25
Plan Total Prescribed Dose: 45 Gy
Reference Point Dosage Given to Date: 5.4 Gy
Reference Point Session Dosage Given: 1.8 Gy
Session Number: 3

## 2023-12-12 ENCOUNTER — Inpatient Hospital Stay

## 2023-12-12 ENCOUNTER — Other Ambulatory Visit: Payer: Self-pay

## 2023-12-12 ENCOUNTER — Inpatient Hospital Stay (HOSPITAL_BASED_OUTPATIENT_CLINIC_OR_DEPARTMENT_OTHER): Admitting: Hematology

## 2023-12-12 ENCOUNTER — Ambulatory Visit
Admission: RE | Admit: 2023-12-12 | Discharge: 2023-12-12 | Disposition: A | Discharge status: Home / Self Care | Source: Ambulatory Visit | Attending: Radiation Oncology

## 2023-12-12 VITALS — BP 136/54 | HR 83 | Temp 97.3°F | Resp 19 | Ht 65.5 in | Wt 140.6 lb

## 2023-12-12 DIAGNOSIS — C787 Secondary malignant neoplasm of liver and intrahepatic bile duct: Secondary | ICD-10-CM | POA: Diagnosis not present

## 2023-12-12 DIAGNOSIS — C23 Malignant neoplasm of gallbladder: Secondary | ICD-10-CM

## 2023-12-12 DIAGNOSIS — D509 Iron deficiency anemia, unspecified: Secondary | ICD-10-CM | POA: Diagnosis not present

## 2023-12-12 DIAGNOSIS — Z5112 Encounter for antineoplastic immunotherapy: Secondary | ICD-10-CM | POA: Diagnosis not present

## 2023-12-12 DIAGNOSIS — Z510 Encounter for antineoplastic radiation therapy: Secondary | ICD-10-CM | POA: Diagnosis not present

## 2023-12-12 DIAGNOSIS — Z95828 Presence of other vascular implants and grafts: Secondary | ICD-10-CM

## 2023-12-12 DIAGNOSIS — Z452 Encounter for adjustment and management of vascular access device: Secondary | ICD-10-CM | POA: Diagnosis not present

## 2023-12-12 LAB — CMP (CANCER CENTER ONLY)
ALT: 14 U/L (ref 0–44)
AST: 34 U/L (ref 15–41)
Albumin: 3.4 g/dL — ABNORMAL LOW (ref 3.5–5.0)
Alkaline Phosphatase: 105 U/L (ref 38–126)
Anion gap: 6 (ref 5–15)
BUN: 26 mg/dL — ABNORMAL HIGH (ref 8–23)
CO2: 28 mmol/L (ref 22–32)
Calcium: 9.7 mg/dL (ref 8.9–10.3)
Chloride: 103 mmol/L (ref 98–111)
Creatinine: 1.07 mg/dL — ABNORMAL HIGH (ref 0.44–1.00)
GFR, Estimated: 52 mL/min — ABNORMAL LOW (ref >60)
Glucose, Bld: 204 mg/dL — ABNORMAL HIGH (ref 70–99)
Potassium: 4.7 mmol/L (ref 3.5–5.1)
Sodium: 137 mmol/L (ref 135–145)
Total Bilirubin: 0.8 mg/dL (ref 0.0–1.2)
Total Protein: 6.3 g/dL — ABNORMAL LOW (ref 6.5–8.1)

## 2023-12-12 LAB — CBC WITH DIFFERENTIAL (CANCER CENTER ONLY)
Abs Immature Granulocytes: 0.01 10*3/uL (ref 0.00–0.07)
Basophils Absolute: 0.1 10*3/uL (ref 0.0–0.1)
Basophils Relative: 1 %
Eosinophils Absolute: 0.1 10*3/uL (ref 0.0–0.5)
Eosinophils Relative: 1 %
HCT: 28.5 % — ABNORMAL LOW (ref 36.0–46.0)
Hemoglobin: 8.9 g/dL — ABNORMAL LOW (ref 12.0–15.0)
Immature Granulocytes: 0 %
Lymphocytes Relative: 9 %
Lymphs Abs: 0.6 10*3/uL — ABNORMAL LOW (ref 0.7–4.0)
MCH: 33.3 pg (ref 26.0–34.0)
MCHC: 31.2 g/dL (ref 30.0–36.0)
MCV: 106.7 fL — ABNORMAL HIGH (ref 80.0–100.0)
Monocytes Absolute: 0.7 10*3/uL (ref 0.1–1.0)
Monocytes Relative: 10 %
Neutro Abs: 5.5 10*3/uL (ref 1.7–7.7)
Neutrophils Relative %: 79 %
Platelet Count: 149 10*3/uL — ABNORMAL LOW (ref 150–400)
RBC: 2.67 MIL/uL — ABNORMAL LOW (ref 3.87–5.11)
RDW: 18 % — ABNORMAL HIGH (ref 11.5–15.5)
WBC Count: 6.9 10*3/uL (ref 4.0–10.5)
nRBC: 0 % (ref 0.0–0.2)

## 2023-12-12 LAB — RAD ONC ARIA SESSION SUMMARY
Course Elapsed Days: 3
Plan Fractions Treated to Date: 4
Plan Prescribed Dose Per Fraction: 1.8 Gy
Plan Total Fractions Prescribed: 25
Plan Total Prescribed Dose: 45 Gy
Reference Point Dosage Given to Date: 7.2 Gy
Reference Point Session Dosage Given: 1.8 Gy
Session Number: 4

## 2023-12-12 LAB — FERRITIN: Ferritin: 230 ng/mL (ref 11–307)

## 2023-12-12 MED ORDER — HEPARIN SOD (PORK) LOCK FLUSH 100 UNIT/ML IV SOLN
500.0000 [IU] | Freq: Once | INTRAVENOUS | Status: AC
  Administered 2023-12-12: 500 [IU]

## 2023-12-12 MED ORDER — SODIUM CHLORIDE 0.9% FLUSH
10.0000 mL | Freq: Once | INTRAVENOUS | Status: AC
  Administered 2023-12-12: 10 mL

## 2023-12-12 NOTE — Assessment & Plan Note (Signed)
 cT4N0M0, stage III -Patient had 1 episode of abdominal pain in October 2023, and was found to have a large gallbladder stone. -She underwent laparoscopic attempted cholecystectomy but was not able to be resected due to adhesions to duodenum and colon. -She developed jaundice in June 2024, status post ERCP and stent placement in CBD.  CT and MRI scan showed a liver mass adjacent to the gallbladder fossae, and a biopsy confirmed adenocarcinoma.  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  Unfortunately this was felt to be not resectable by Dr. Freida Busman. -I reviewed her CT chest from April 22, 2023, which showed no evidence of distant metastasis. -I recommended first-line cisplatin, gemcitabine  every 14 days and durvalumab every 28 days.  If she responded well, will stop chemo after 4 to 6 months of treatment, and changed to maintenance therapy. She started on 05/03/23 every 2 weeks and has been tolerating well.  We have also discussed option of consolidation radiation at some point. -restaging CT 07/08/2023 showed partial response in the primary site tumor with direct invasion to liver.  No new disease or question.   -case reviewed in GI tumor board, consolidation RT is also an option. She started RT on 12/09/2023, will start concurrent Xeloda this week when she receives it

## 2023-12-13 ENCOUNTER — Encounter: Payer: Self-pay | Admitting: Hematology

## 2023-12-13 ENCOUNTER — Ambulatory Visit
Admission: RE | Admit: 2023-12-13 | Discharge: 2023-12-13 | Disposition: A | Discharge status: Home / Self Care | Source: Ambulatory Visit | Attending: Radiation Oncology | Admitting: Radiation Oncology

## 2023-12-13 ENCOUNTER — Other Ambulatory Visit: Payer: Self-pay

## 2023-12-13 DIAGNOSIS — Z5112 Encounter for antineoplastic immunotherapy: Secondary | ICD-10-CM | POA: Diagnosis not present

## 2023-12-13 DIAGNOSIS — D509 Iron deficiency anemia, unspecified: Secondary | ICD-10-CM | POA: Diagnosis not present

## 2023-12-13 DIAGNOSIS — C23 Malignant neoplasm of gallbladder: Secondary | ICD-10-CM | POA: Diagnosis not present

## 2023-12-13 DIAGNOSIS — Z452 Encounter for adjustment and management of vascular access device: Secondary | ICD-10-CM | POA: Diagnosis not present

## 2023-12-13 DIAGNOSIS — Z510 Encounter for antineoplastic radiation therapy: Secondary | ICD-10-CM | POA: Diagnosis not present

## 2023-12-13 DIAGNOSIS — C787 Secondary malignant neoplasm of liver and intrahepatic bile duct: Secondary | ICD-10-CM | POA: Diagnosis not present

## 2023-12-13 LAB — RAD ONC ARIA SESSION SUMMARY
Course Elapsed Days: 4
Plan Fractions Treated to Date: 5
Plan Prescribed Dose Per Fraction: 1.8 Gy
Plan Total Fractions Prescribed: 25
Plan Total Prescribed Dose: 45 Gy
Reference Point Dosage Given to Date: 9 Gy
Reference Point Session Dosage Given: 1.8 Gy
Session Number: 5

## 2023-12-13 LAB — CANCER ANTIGEN 19-9: CA 19-9: 447 U/mL — ABNORMAL HIGH (ref 0–35)

## 2023-12-13 MED ORDER — SONAFINE EX EMUL
1.0000 | Freq: Once | CUTANEOUS | Status: AC
  Administered 2023-12-13: 1 via TOPICAL

## 2023-12-13 NOTE — Progress Notes (Addendum)
 [facility]   [phone] Fax:([phone]) [phone]   Clinic Follow up Note   Patient Care Team: Shon Hale, MD as PCP - General (Family Medicine) Runell Gess, MD as PCP - Cardiology (Cardiology) Malachy Mood, MD as Consulting Physician (Oncology)  Date of Service:  12/12/2023  CHIEF COMPLAINT: f/u of gallbladder cancer  CURRENT THERAPY:  Concurrent chemoradiation with capecitabine  Oncology History   Gallbladder cancer (HCC) cT4N0M0, stage III -Patient had 1 episode of abdominal pain in October 2023, and was found to have a large gallbladder stone. -She underwent laparoscopic attempted cholecystectomy but was not able to be resected due to adhesions to duodenum and colon. -She developed jaundice in June 2024, status post ERCP and stent placement in CBD.  CT and MRI scan showed a liver mass adjacent to the gallbladder fossae, and a biopsy confirmed adenocarcinoma.  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  Unfortunately this was felt to be not resectable by Dr. Freida Busman. -I reviewed her CT chest from April 22, 2023, which showed no evidence of distant metastasis. -I recommended first-line cisplatin, gemcitabine  every 14 days and durvalumab every 28 days.  If she responded well, will stop chemo after 4 to 6 months of treatment, and changed to maintenance therapy. She started on 05/03/23 every 2 weeks and has been tolerating well.  We have also discussed option of consolidation radiation at some point. -restaging CT 07/08/2023 showed partial response in the primary site tumor with direct invasion to liver.  No new disease or question.   -case reviewed in GI tumor board, consolidation RT is also an option. She started RT on 12/09/2023 with concurrent capecitabine   Assessment and Plan    Gallbladder cancer She is undergoing treatment for gallbladder  cancer with radiation therapy and oral chemotherapy. Radiation commenced on Monday, and she has been taking the chemotherapy pill since Monday evening without adverse reactions. The treatment plan includes radiation Monday through Friday with a break on weekends. She is tolerating the treatment well, feeling stronger and less fatigued compared to previous intravenous chemotherapy. Potential side effects of the chemotherapy pill include redness, rash, and skin peeling on the palms and soles. Lotion and Voltaren are recommended as precautions. She expressed a desire to continue aggressive cancer treatment, opting to maintain immunotherapy despite limited data on its necessity during radiation. If cancer progression is noted post-radiation, resuming or altering chemotherapy will be considered. - Continue radiation therapy Monday through Friday with a break on weekends - Continue oral chemotherapy as prescribed - Monitor for side effects such as redness, rash, and skin peeling on palms and soles - Use lotion and Voltaren as a precaution for skin side effects - Continue immunotherapy as scheduled - Schedule next infusion on March 27 - Order blood work next Thursday - Evaluate treatment response with future scans - Consider resuming or changing chemotherapy if cancer progression is noted post-radiation  Anemia Hemoglobin level is 8.9, an improvement attributed to the cessation of intravenous chemotherapy, which likely contributed to anemia. Other blood counts are stable, and pending blood tests are expected to be normal. - Monitor hemoglobin and other blood counts regularly      Plan -She is clinically doing well, tolerating chemoradiation well, will continue -Lab and follow-up, and durvalumab in 1 weeks   SUMMARY OF ONCOLOGIC HISTORY: Oncology History Overview Note   Cancer Staging  Gallbladder cancer (  HCC) Staging form: Distal Bile Duct, AJCC 8th Edition - Clinical stage from 04/09/2023: Stage  IIIB (cT4, cN0, cM0) - Signed by Malachy Mood, MD on 04/18/2023 Total positive nodes: 0 Histologic grade (G): G2 Histologic grading system: 3 grade system     Gallbladder cancer (HCC)  04/04/2023 Imaging    IMPRESSION: 1. Ill-defined hypodensity in the region of the gallbladder fossa appears similar to the prior study given lack of contrast. This would be better evaluated with dedicated MRI. 2. Nonobstructing right renal calculus. 3. Large amount of stool throughout the colon. 4. Stable indeterminate lucent areas in the vertebral bodies of the lumbar spine. 5. Severe atherosclerotic disease.     04/09/2023 Pathology Results     FINAL MICROSCOPIC DIAGNOSIS:   A. LIVER, ADJACENT TO GALLBLADDER FOSSA, BIOPSY:  - Moderately differentiated adenocarcinoma    04/09/2023 Cancer Staging   Staging form: Distal Bile Duct, AJCC 8th Edition - Clinical stage from 04/09/2023: Stage IIIB (cT4, cN0, cM0) - Signed by Malachy Mood, MD on 04/18/2023 Total positive nodes: 0 Histologic grade (G): G2 Histologic grading system: 3 grade system   04/18/2023 Initial Diagnosis   Cholangiocarcinoma of liver (HCC)   05/03/2023 - 11/08/2023 Chemotherapy   Patient is on Treatment Plan : BILIARY TRACT Cisplatin + Gemcitabine D1,15 + Durvalumab (1500) D1 q28d / Durvalumab (1500) q28d     07/08/2023 Imaging   CT Chest abdomen and pelvis with contrast  IMPRESSION: CT CHEST IMPRESSION  1.  No acute process or evidence of metastatic disease in the chest. 2. Left Port-A-Cath tip in azygous vein. This is being adjusted by interventional radiology later today. 3. Ongoing stability of tiny bilateral pulmonary nodules, favored to be benign. 4.  Aortic Atherosclerosis (ICD10-I70.0).  CT ABDOMEN AND PELVIS IMPRESSION  1. The segment 5 liver lesion is decreased in size compared to the MRI of 04/05/2023. 2. Placement of a biliary stent with improvement, near complete resolution of duct dilatation. 3. No abdominopelvic  adenopathy. 4. Proximal gastric underdistention. Apparent wall thickening could be secondary. Correlate with symptoms of gastritis 5. Subtle findings within the superior spleen for which small volume interval splenic infarct cannot be excluded. Correlate with left upper quadrant symptoms. 6.  Possible constipation.     09/27/2023 Imaging   CT chest abdomen and pelvis with contrast  IMPRESSION: CHEST:  1. Stable small pulmonary nodules. 2. No evidence of thoracic metastasis.  PELVIS:  1. Stable hepatic metastasis. 2. No new hepatic lesions. 3. No evidence of metastatic adenopathy in the abdomen pelvis. 4. Intrahepatic and extrahepatic biliary stent in place. No biliary duct dilatation. 5.  Aortic Atherosclerosis (ICD10-I70.0).   11/22/2023 -  Chemotherapy   Patient is on Treatment Plan : LUNG NSCLC Durvalumab (1500) q28d        Discussed the use of AI scribe software for clinical note transcription with the patient, who gave verbal consent to proceed.  History of Present Illness   [name] is an 83 year old female with gallbladder cancer who presents for follow-up. She lives with her youngest daughter, who works, and her older daughter, a Runner, broadcasting/film/video at a charter school in [facility], visits occasionally.  She is undergoing treatment for gallbladder cancer, having started radiation therapy and oral chemotherapy on Monday. She takes two chemotherapy pills within thirty minutes of breakfast and dinner, spaced ten to twelve hours apart. She feels stronger and less fatigued with this regimen compared to previous intravenous chemotherapy, which caused bloating and discomfort. Her hemoglobin level has  improved to 8.9, and other blood counts remain stable. She uses Voltaren as a precautionary measure and has not experienced any side effects.         All other systems were reviewed with the patient and are negative.  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 There is a small calcified mass in the L:A near the LA appendage. No valvular masses seen with associated mitral annular calcification. LA Volume/ BSA27.4 ml/m2 No AS. Right ventricular systolic pressure is elevated at .   Jaundice    Myocardial infarction [facility])    June 2021   Neuromuscular disorder (HCC)    neuropathy in bilateral feet   Peripheral vascular disease (HCC)    Pneumonia    not hosp.    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  PTCAs with drug coated balloon Lt ant tibial artery and Lt popliteal artery  11/26/2019    SURGICAL HISTORY: Past Surgical History:  Procedure Laterality Date   ABDOMINAL AORTAGRAM  11/25/2019   ABDOMINAL AORTOGRAM W/LOWER EXTREMITY    ABDOMINAL AORTOGRAM N/A 06/15/2019   Procedure: ABDOMINAL AORTOGRAM;  Surgeon: Runell Gess, MD;  [facility];  Service: Cardiovascular;  Laterality: N/A;   ABDOMINAL AORTOGRAM W/LOWER EXTREMITY N/A 11/25/2019   Procedure: ABDOMINAL AORTOGRAM W/LOWER EXTREMITY;  Surgeon: Iran Ouch, MD;  [facility];  Service: Cardiovascular;  Laterality: N/A;  Lt leg   ABDOMINAL AORTOGRAM W/LOWER EXTREMITY N/A 08/01/2020   Procedure: ABDOMINAL AORTOGRAM W/LOWER EXTREMITY;  Surgeon: Runell Gess, MD;  [facility];  Service: Cardiovascular;  Laterality: N/A;   ABDOMINAL AORTOGRAM W/LOWER EXTREMITY  N/A 04/28/2021   Procedure: ABDOMINAL AORTOGRAM W/LOWER EXTREMITY;  Surgeon: Sherren Kerns, MD;  [facility];  Service: Cardiovascular;  Laterality: N/A;   ABDOMINAL AORTOGRAM W/LOWER EXTREMITY N/A 10/13/2021   Procedure: ABDOMINAL AORTOGRAM W/LOWER EXTREMITY;  Surgeon: Leonie Douglas, MD;  [facility];  Service: Cardiovascular;  Laterality: N/A;   ABDOMINAL AORTOGRAM W/LOWER EXTREMITY N/A 11/09/2022   Procedure: ABDOMINAL AORTOGRAM W/LOWER EXTREMITY;  Surgeon: Leonie Douglas, MD;  [facility];  Service: Cardiovascular;  Laterality: N/A;   ABDOMINAL AORTOGRAM W/LOWER EXTREMITY N/A 11/16/2022   Procedure: ABDOMINAL AORTOGRAM W/LOWER EXTREMITY;  Surgeon: Leonie Douglas, MD;  [facility];  Service: Cardiovascular;  Laterality: N/A;   AMPUTATION TOE Left 05/03/2021   Procedure: AMPUTATION OF THIRD LEFT  TOE;  Surgeon: Leonie Douglas, MD;  Location: [facility] OR;  Service: Vascular;  Laterality: Left;   APPENDECTOMY     APPLICATION OF WOUND VAC Right 07/21/2019   Procedure: Application Of Prevena Wound Vac Right Groin;  Surgeon: Nada Libman, MD;  Location: [facility] OR;  Service: Vascular;  Laterality:  Right;   BILIARY BRUSHING  04/07/2023   Procedure: BILIARY BRUSHING;  Surgeon: Vida Rigger, MD;  Location: [facility] ENDOSCOPY;  Service: Gastroenterology;;   BILIARY STENT PLACEMENT  04/07/2023   Procedure: BILIARY STENT PLACEMENT;  Surgeon: Vida Rigger, MD;  Location: [facility] ENDOSCOPY;  Service: Gastroenterology;;   CARPAL TUNNEL RELEASE Left    CARPAL TUNNEL RELEASE Right    CESAREAN SECTION     x 2   CHOLECYSTECTOMY N/A 10/12/2022   Procedure: LAPAROSCOPIC PARTIAL FENESTRATING CHOLECYSTECTOMY;  Surgeon: Fritzi Mandes, MD;  [facility];  Service: General;  Laterality: N/A;   COLONOSCOPY     CORONARY ARTERY BYPASS GRAFT N/A 03/24/2020   Procedure: CORONARY ARTERY BYPASS GRAFTING (CABG) using LIMA to LAD (m); RIMA to RAMUS; Endoscopic  Right Greater Saphenous Vein: SVG to Diag1; SVG to PLB (right); and SVG to PL (left).;  Surgeon: Linden Dolin, MD;  [facility];  Service: Open Heart Surgery;  Laterality: N/A;  BILATERAL IMA   ENDARTERECTOMY FEMORAL Right 07/21/2019   Procedure: RIGHT ENDARTERECTOMY FEMORAL WITH PATCH ANGIOPLASTY;  Surgeon: Nada Libman, MD;  [facility];  Service: Vascular;  Laterality: Right;   ENDARTERECTOMY FEMORAL Right 08/16/2020   Procedure: RIGHT FEMORAL ENDARTERECTOMY;  Surgeon: Maeola Harman, MD;  Location: [facility] OR;  Service: Vascular;  Laterality: Right;   ENDOVEIN HARVEST OF GREATER SAPHENOUS VEIN Right 03/24/2020   Procedure: ENDOVEIN HARVEST OF GREATER SAPHENOUS VEIN;  Surgeon: Linden Dolin, MD;  [facility];  Service: Open Heart Surgery;  Laterality: Right;   ERCP N/A 04/07/2023   Procedure: ENDOSCOPIC RETROGRADE CHOLANGIOPANCREATOGRAPHY (ERCP);  Surgeon: Vida Rigger, MD;  Location: [facility] ENDOSCOPY;  Service: Gastroenterology;  Laterality: N/A;   EYE SURGERY     cataract removal bilaterally   FEMORAL-POPLITEAL BYPASS GRAFT Right 08/16/2020   Procedure: BYPASS GRAFT FEMORAL-POPLITEAL ARTERY;  Surgeon: Maeola Harman, MD;  Location: [facility] OR;  Service: Vascular;  Laterality: Right;   FEMORAL-POPLITEAL BYPASS GRAFT Left 05/03/2021   Procedure: LEFT FEMORAL TO BELOW KNEE POPLITEAL ARTERY BYPASS GRAFTING WITH 6MMX80 PTFE GRAFT;  Surgeon: Leonie Douglas, MD;  [facility];  Service: Vascular;  Laterality: Left;   LEFT HEART CATH AND CORONARY ANGIOGRAPHY N/A 03/22/2020   Procedure: LEFT HEART CATH AND CORONARY ANGIOGRAPHY;  Surgeon: Swaziland, Peter M, MD;  Location: [facility] INVASIVE CV LAB;  Service: Cardiovascular;  Laterality: N/A;   LOWER EXTREMITY ANGIOGRAPHY Bilateral 06/15/2019   Procedure: Lower Extremity Angiography;  Surgeon: Runell Gess, MD;  Location: [facility] INVASIVE CV LAB;  Service: Cardiovascular;  Laterality: Bilateral;   LOWER EXTREMITY ANGIOGRAPHY  Right 08/03/2019   Procedure: LOWER EXTREMITY ANGIOGRAPHY;  Surgeon: Runell Gess, MD;  [facility];  Service: Cardiovascular;  Laterality: Right;   LOWER EXTREMITY ANGIOGRAPHY N/A 09/07/2021   Procedure: LOWER EXTREMITY ANGIOGRAPHY;  Surgeon: Cephus Shelling, MD;  [facility];  Service: Cardiovascular;  Laterality: N/A;   PATCH ANGIOPLASTY Right 07/21/2019   Procedure: Patch Angioplasty Right Femoral Artery;  Surgeon: Nada Libman, MD;  Location: [facility] OR;  Service: Vascular;  Laterality: Right;   PERIPHERAL INTRAVASCULAR LITHOTRIPSY  11/25/2019   Procedure: INTRAVASCULAR LITHOTRIPSY;  Surgeon: Iran Ouch, MD;  [facility];  Service: Cardiovascular;;  LT. SFA   PERIPHERAL VASCULAR ATHERECTOMY  08/03/2019   Procedure: PERIPHERAL VASCULAR ATHERECTOMY;  Surgeon: Runell Gess, MD;  Location: [facility] INVASIVE CV LAB;  Service: Cardiovascular;;  right SFA, right TP trunk   PERIPHERAL  VASCULAR ATHERECTOMY  11/25/2019   Procedure: PERIPHERAL VASCULAR ATHERECTOMY;  Surgeon: Iran Ouch, MD;  [facility];  Service: Cardiovascular;;  Lt.  POPLITEAL and AT   PERIPHERAL VASCULAR BALLOON ANGIOPLASTY Left 06/15/2019   Procedure: PERIPHERAL VASCULAR BALLOON ANGIOPLASTY;  Surgeon: Runell Gess, MD;  [facility];  Service: Cardiovascular;  Laterality: Left;  SFA UNSUCCESSFUL UNABLE TO CROSS LESION   PERIPHERAL VASCULAR BALLOON ANGIOPLASTY  08/03/2019   Procedure: PERIPHERAL VASCULAR BALLOON ANGIOPLASTY;  Surgeon: Runell Gess, MD;  [facility];  Service: Cardiovascular;;  right SFA, Right TP trunk   PERIPHERAL VASCULAR BALLOON ANGIOPLASTY  09/07/2021   Procedure: PERIPHERAL VASCULAR BALLOON ANGIOPLASTY;  Surgeon: Cephus Shelling, MD;  [facility];  Service: Cardiovascular;;   PERIPHERAL VASCULAR BALLOON ANGIOPLASTY Right 10/13/2021   Procedure: PERIPHERAL  VASCULAR BALLOON ANGIOPLASTY;  Surgeon: Leonie Douglas, MD;  [facility];  Service: Cardiovascular;  Laterality: Right;   PERIPHERAL VASCULAR BALLOON ANGIOPLASTY  11/09/2022   Procedure: PERIPHERAL VASCULAR BALLOON ANGIOPLASTY;  Surgeon: Leonie Douglas, MD;  [facility];  Service: Cardiovascular;;  fem-pop bypass and AT   PORTACATH PLACEMENT Left 04/29/2023   Procedure: INSERTION PORT-A-CATH;  Surgeon: Fritzi Mandes, MD;  Location: [facility] ORS;  Service: General;  Laterality: Left;   PORTACATH PLACEMENT N/A 07/12/2023   Procedure: REVISION PORT-A-CATH;  Surgeon: Fritzi Mandes, MD;  Location: [facility] OR;  Service: General;  Laterality: N/A;   REMOVAL OF STONES  04/07/2023   Procedure: REMOVAL OF STONES;  Surgeon: Vida Rigger, MD;  Location: [facility] ENDOSCOPY;  Service: Gastroenterology;;   Dennison Mascot  04/07/2023   Procedure: Dennison Mascot;  Surgeon: Vida Rigger, MD;  Location: [facility] ENDOSCOPY;  Service: Gastroenterology;;   TEE WITHOUT CARDIOVERSION N/A 03/24/2020   Procedure: TRANSESOPHAGEAL ECHOCARDIOGRAM (TEE);  Surgeon: Linden Dolin, MD;  Location: [facility] OR;  Service: Open Heart Surgery;  Laterality: N/A;   TONSILLECTOMY     and adenoidectomy   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/2019   Procedure: TRANSMETATARSAL AMPUTATION;  Surgeon: Vivi Barrack, DPM;  [facility];  Service: Podiatry;  Laterality: Right;   TRANSMETATARSAL AMPUTATION Left 05/11/2021   Procedure: LEFT TRANSMETATARSAL AMPUTATION;  Surgeon: Maeola Harman, MD;  Location: [name] OR;  Service: Vascular;  Laterality: Left;   TUBAL LIGATION      I have reviewed the social history and family history with the patient and they are unchanged from previous note.  ALLERGIES:  is allergic to versed [midazolam], augmentin [amoxicillin-pot clavulanate], bactrim [sulfamethoxazole-trimethoprim], demerol [meperidine], meperidine hcl, scopolamine, and tramadol.  MEDICATIONS:  Current  Outpatient Medications  Medication Sig Dispense Refill   aspirin EC 81 MG tablet Take 81 mg by mouth at bedtime. Swallow whole.     atorvastatin (LIPITOR) 80 MG tablet Take 1 tablet (80 mg total) by mouth daily. 90 tablet 3   brimonidine (ALPHAGAN) 0.2 % ophthalmic solution Place 1 drop into the right eye 2 (two) times daily.     capecitabine (XELODA) 500 MG tablet Take 2 tablets (1000 mg total) by mouth 2 (two) times daily. Take within 30 minutes after meals. Take only on days of radiation, Monday to Friday. 60 tablet 1   empagliflozin (JARDIANCE) 25 MG TABS tablet Take 1 tablet (25 mg total) by mouth daily before breakfast. 90 tablet 3   hydrochlorothiazide (HYDRODIURIL) 12.5 MG tablet Take 1 tablet (12.5 mg total) by mouth daily. 90 tablet 0   Insulin  Lispro Prot & Lispro (HUMALOG MIX 75/25 KWIKPEN) (75-25) 100 UNIT/ML Kwikpen INJECT 6 UNITS SUBCUTANEOUSLY ONCE DAILY WITH BREAKFAST 15 mL 4   Insulin Pen Needle (ULTRA-THIN II MINI PEN NEEDLE) 31G X 5 MM MISC Use as directed to check blood glucose 50 each 2   Lancets (ONETOUCH DELICA PLUS LANCET33G) MISC Use to check blood sugars twice daily. (Patient taking differently: Use to check blood sugars  daily.) 100 each 5   MAGNESIUM-OXIDE 400 (240 Mg) MG tablet Take 1 tablet by mouth twice daily 60 tablet 0   metFORMIN (GLUCOPHAGE) 1000 MG tablet Take 1 tablet (1,000 mg total) by mouth 2 (two) times daily. 180 tablet 2   metoprolol tartrate (LOPRESSOR) 50 MG tablet Take 1 tablet by mouth twice daily 60 tablet 0   Multiple Vitamins-Minerals (MULTI FOR HER PO) Take 1 tablet by mouth daily.     mupirocin ointment (BACTROBAN) 2 % Apply 1 Application topically 2 (two) times daily. (Patient taking differently: Apply 1 Application topically 2 (two) times daily as needed (wound care for toes).) 30 g 2   ONETOUCH VERIO test strip USE TO CHECK BLOOD SUGARS ONCE DAILY 100 each 2   Propylene Glycol (SYSTANE BALANCE) 0.6 % SOLN Place 1 drop into the left eye 3  (three) times daily as needed (dry/irritated eyes).     repaglinide (PRANDIN) 1 MG tablet Take 1 tablet (1 mg total) by mouth 2 (two) times daily before a meal. Take 1 tab by breakfast and 1 tab before dinner 180 tablet 4   rivaroxaban (XARELTO) 2.5 MG TABS tablet Take 1 tablet by mouth twice daily 60 tablet 11   telmisartan (MICARDIS) 80 MG tablet Take 1 tablet by mouth once daily 30 tablet 0   pantoprazole (PROTONIX) 40 MG tablet Take 1 tablet by mouth once daily (Patient not taking: Reported on 12/12/2023) 90 tablet 3   No current facility-administered medications for this visit.    PHYSICAL EXAMINATION: ECOG PERFORMANCE STATUS: 1 - Symptomatic but completely ambulatory  Vitals:   12/12/23 1251  BP: (!) 136/54  Pulse: 83  Resp: 19  Temp: (!) 97.3 F (36.3 C)  SpO2: 97%   Wt Readings from Last 3 Encounters:  12/12/23 140 lb 9.6 oz (63.8 kg)  11/28/23 141 lb 8 oz (64.2 kg)  11/21/23 143 lb 14.4 oz (65.3 kg)     GENERAL:alert, no distress and comfortable SKIN: skin color, texture, turgor are normal, no rashes or significant lesions EYES: normal, Conjunctiva are pink and non-injected, sclera clear NECK: supple, thyroid normal size, non-tender, without nodularity LYMPH:  no palpable lymphadenopathy in the cervical, axillary  LUNGS: clear to auscultation and percussion with normal breathing effort HEART: regular rate & rhythm and no murmurs and no lower extremity edema ABDOMEN:abdomen soft, non-tender and normal bowel sounds Musculoskeletal:no cyanosis of digits and no clubbing  NEURO: alert & oriented x 3 with fluent speech, no focal motor/sensory deficits   LABORATORY DATA:  I have reviewed the data as listed    Latest Ref Rng & Units 12/12/2023   12:36 PM 11/28/2023    8:19 AM 11/21/2023   10:18 AM  CBC  WBC 4.0 - 10.5 K/uL 6.9  9.5  9.5   Hemoglobin 12.0 - 15.0 g/dL 8.9  8.5  8.1   Hematocrit 36.0 - 46.0 % 28.5  27.0  26.0   Platelets 150 - 400 K/uL 149  345  182          Latest Ref Rng & Units  12/12/2023   12:36 PM 11/28/2023    8:19 AM 11/21/2023   10:18 AM  CMP  Glucose 70 - 99 mg/dL 147  829  562   BUN 8 - 23 mg/dL 26  23  31    Creatinine 0.44 - 1.00 mg/dL 1.30  8.65  7.84   Sodium 135 - 145 mmol/L 137  138  139   Potassium 3.5 - 5.1 mmol/L 4.7  4.2  4.4   Chloride 98 - 111 mmol/L 103  103  104   CO2 22 - 32 mmol/L 28  29  27    Calcium 8.9 - 10.3 mg/dL 9.7  9.2  9.4   Total Protein 6.5 - 8.1 g/dL 6.3  6.1  6.3   Total Bilirubin 0.0 - 1.2 mg/dL 0.8  0.8  0.8   Alkaline Phos 38 - 126 U/L 105  84  103   AST 15 - 41 U/L 34  27  27   ALT 0 - 44 U/L 14  11  13        RADIOGRAPHIC STUDIES: I have personally reviewed the radiological images as listed and agreed with the findings in the report. No results found.    No orders of the defined types were placed in this encounter.  All questions were answered. The patient knows to call the clinic with any problems, questions or concerns. No barriers to learning was detected. The total time spent in the appointment was 25 minutes.     Malachy Mood, MD 12/12/2023

## 2023-12-16 ENCOUNTER — Other Ambulatory Visit: Payer: Self-pay

## 2023-12-16 ENCOUNTER — Ambulatory Visit
Admission: RE | Admit: 2023-12-16 | Discharge: 2023-12-16 | Disposition: A | Discharge status: Home / Self Care | Source: Ambulatory Visit | Attending: Radiation Oncology

## 2023-12-16 DIAGNOSIS — D509 Iron deficiency anemia, unspecified: Secondary | ICD-10-CM | POA: Diagnosis not present

## 2023-12-16 DIAGNOSIS — Z5112 Encounter for antineoplastic immunotherapy: Secondary | ICD-10-CM | POA: Diagnosis not present

## 2023-12-16 DIAGNOSIS — C23 Malignant neoplasm of gallbladder: Secondary | ICD-10-CM | POA: Diagnosis not present

## 2023-12-16 DIAGNOSIS — Z452 Encounter for adjustment and management of vascular access device: Secondary | ICD-10-CM | POA: Diagnosis not present

## 2023-12-16 DIAGNOSIS — C787 Secondary malignant neoplasm of liver and intrahepatic bile duct: Secondary | ICD-10-CM | POA: Diagnosis not present

## 2023-12-16 DIAGNOSIS — Z510 Encounter for antineoplastic radiation therapy: Secondary | ICD-10-CM | POA: Diagnosis not present

## 2023-12-16 LAB — RAD ONC ARIA SESSION SUMMARY
Course Elapsed Days: 7
Plan Fractions Treated to Date: 6
Plan Prescribed Dose Per Fraction: 1.8 Gy
Plan Total Fractions Prescribed: 25
Plan Total Prescribed Dose: 45 Gy
Reference Point Dosage Given to Date: 10.8 Gy
Reference Point Session Dosage Given: 1.8 Gy
Session Number: 6

## 2023-12-17 ENCOUNTER — Other Ambulatory Visit: Payer: Self-pay | Admitting: Endocrinology

## 2023-12-17 ENCOUNTER — Other Ambulatory Visit: Payer: Self-pay

## 2023-12-17 ENCOUNTER — Ambulatory Visit
Admission: RE | Admit: 2023-12-17 | Discharge: 2023-12-17 | Disposition: A | Discharge status: Home / Self Care | Source: Ambulatory Visit | Attending: Radiation Oncology | Admitting: Radiation Oncology

## 2023-12-17 ENCOUNTER — Other Ambulatory Visit: Payer: Self-pay | Admitting: Cardiovascular Disease

## 2023-12-17 ENCOUNTER — Other Ambulatory Visit: Payer: Self-pay | Admitting: Hematology

## 2023-12-17 DIAGNOSIS — C787 Secondary malignant neoplasm of liver and intrahepatic bile duct: Secondary | ICD-10-CM | POA: Diagnosis not present

## 2023-12-17 DIAGNOSIS — D509 Iron deficiency anemia, unspecified: Secondary | ICD-10-CM | POA: Diagnosis not present

## 2023-12-17 DIAGNOSIS — I10 Essential (primary) hypertension: Secondary | ICD-10-CM

## 2023-12-17 DIAGNOSIS — Z452 Encounter for adjustment and management of vascular access device: Secondary | ICD-10-CM | POA: Diagnosis not present

## 2023-12-17 DIAGNOSIS — C23 Malignant neoplasm of gallbladder: Secondary | ICD-10-CM | POA: Diagnosis not present

## 2023-12-17 DIAGNOSIS — Z5112 Encounter for antineoplastic immunotherapy: Secondary | ICD-10-CM | POA: Diagnosis not present

## 2023-12-17 DIAGNOSIS — Z510 Encounter for antineoplastic radiation therapy: Secondary | ICD-10-CM | POA: Diagnosis not present

## 2023-12-17 LAB — RAD ONC ARIA SESSION SUMMARY
Course Elapsed Days: 8
Plan Fractions Treated to Date: 7
Plan Prescribed Dose Per Fraction: 1.8 Gy
Plan Total Fractions Prescribed: 25
Plan Total Prescribed Dose: 45 Gy
Reference Point Dosage Given to Date: 12.6 Gy
Reference Point Session Dosage Given: 1.8 Gy
Session Number: 7

## 2023-12-18 ENCOUNTER — Other Ambulatory Visit: Payer: Self-pay

## 2023-12-18 ENCOUNTER — Ambulatory Visit
Admission: RE | Admit: 2023-12-18 | Discharge: 2023-12-18 | Disposition: A | Discharge status: Home / Self Care | Source: Ambulatory Visit | Attending: Radiation Oncology

## 2023-12-18 DIAGNOSIS — D509 Iron deficiency anemia, unspecified: Secondary | ICD-10-CM | POA: Diagnosis not present

## 2023-12-18 DIAGNOSIS — Z452 Encounter for adjustment and management of vascular access device: Secondary | ICD-10-CM | POA: Diagnosis not present

## 2023-12-18 DIAGNOSIS — Z5112 Encounter for antineoplastic immunotherapy: Secondary | ICD-10-CM | POA: Diagnosis not present

## 2023-12-18 DIAGNOSIS — C787 Secondary malignant neoplasm of liver and intrahepatic bile duct: Secondary | ICD-10-CM | POA: Diagnosis not present

## 2023-12-18 DIAGNOSIS — C23 Malignant neoplasm of gallbladder: Secondary | ICD-10-CM | POA: Diagnosis not present

## 2023-12-18 DIAGNOSIS — Z510 Encounter for antineoplastic radiation therapy: Secondary | ICD-10-CM | POA: Diagnosis not present

## 2023-12-18 LAB — RAD ONC ARIA SESSION SUMMARY
Course Elapsed Days: 9
Plan Fractions Treated to Date: 8
Plan Prescribed Dose Per Fraction: 1.8 Gy
Plan Total Fractions Prescribed: 25
Plan Total Prescribed Dose: 45 Gy
Reference Point Dosage Given to Date: 14.4 Gy
Reference Point Session Dosage Given: 1.8 Gy
Session Number: 8

## 2023-12-19 ENCOUNTER — Inpatient Hospital Stay: Payer: Medicare Other

## 2023-12-19 ENCOUNTER — Other Ambulatory Visit: Payer: Self-pay

## 2023-12-19 ENCOUNTER — Ambulatory Visit
Admission: RE | Admit: 2023-12-19 | Discharge: 2023-12-19 | Disposition: A | Discharge status: Home / Self Care | Source: Ambulatory Visit | Attending: Radiation Oncology | Admitting: Radiation Oncology

## 2023-12-19 ENCOUNTER — Inpatient Hospital Stay (HOSPITAL_BASED_OUTPATIENT_CLINIC_OR_DEPARTMENT_OTHER): Payer: Medicare Other | Admitting: Hematology

## 2023-12-19 VITALS — BP 123/44 | HR 79 | Temp 98.2°F | Resp 17 | Wt 133.4 lb

## 2023-12-19 DIAGNOSIS — Z95828 Presence of other vascular implants and grafts: Secondary | ICD-10-CM

## 2023-12-19 DIAGNOSIS — C23 Malignant neoplasm of gallbladder: Secondary | ICD-10-CM

## 2023-12-19 DIAGNOSIS — Z452 Encounter for adjustment and management of vascular access device: Secondary | ICD-10-CM | POA: Diagnosis not present

## 2023-12-19 DIAGNOSIS — D509 Iron deficiency anemia, unspecified: Secondary | ICD-10-CM | POA: Diagnosis not present

## 2023-12-19 DIAGNOSIS — C787 Secondary malignant neoplasm of liver and intrahepatic bile duct: Secondary | ICD-10-CM | POA: Diagnosis not present

## 2023-12-19 DIAGNOSIS — Z5112 Encounter for antineoplastic immunotherapy: Secondary | ICD-10-CM | POA: Diagnosis not present

## 2023-12-19 DIAGNOSIS — Z510 Encounter for antineoplastic radiation therapy: Secondary | ICD-10-CM | POA: Diagnosis not present

## 2023-12-19 LAB — CMP (CANCER CENTER ONLY)
ALT: 14 U/L (ref 0–44)
AST: 28 U/L (ref 15–41)
Albumin: 3.4 g/dL — ABNORMAL LOW (ref 3.5–5.0)
Alkaline Phosphatase: 101 U/L (ref 38–126)
Anion gap: 4 — ABNORMAL LOW (ref 5–15)
BUN: 35 mg/dL — ABNORMAL HIGH (ref 8–23)
CO2: 30 mmol/L (ref 22–32)
Calcium: 9.6 mg/dL (ref 8.9–10.3)
Chloride: 104 mmol/L (ref 98–111)
Creatinine: 1.09 mg/dL — ABNORMAL HIGH (ref 0.44–1.00)
GFR, Estimated: 51 mL/min — ABNORMAL LOW (ref >60)
Glucose, Bld: 165 mg/dL — ABNORMAL HIGH (ref 70–99)
Potassium: 4.5 mmol/L (ref 3.5–5.1)
Sodium: 138 mmol/L (ref 135–145)
Total Bilirubin: 0.6 mg/dL (ref 0.0–1.2)
Total Protein: 6.1 g/dL — ABNORMAL LOW (ref 6.5–8.1)

## 2023-12-19 LAB — RAD ONC ARIA SESSION SUMMARY
Course Elapsed Days: 10
Plan Fractions Treated to Date: 9
Plan Prescribed Dose Per Fraction: 1.8 Gy
Plan Total Fractions Prescribed: 25
Plan Total Prescribed Dose: 45 Gy
Reference Point Dosage Given to Date: 16.2 Gy
Reference Point Session Dosage Given: 1.8 Gy
Session Number: 9

## 2023-12-19 LAB — CBC WITH DIFFERENTIAL (CANCER CENTER ONLY)
Abs Immature Granulocytes: 0.03 10*3/uL (ref 0.00–0.07)
Basophils Absolute: 0 10*3/uL (ref 0.0–0.1)
Basophils Relative: 1 %
Eosinophils Absolute: 0.1 10*3/uL (ref 0.0–0.5)
Eosinophils Relative: 2 %
HCT: 25.6 % — ABNORMAL LOW (ref 36.0–46.0)
Hemoglobin: 8.2 g/dL — ABNORMAL LOW (ref 12.0–15.0)
Immature Granulocytes: 1 %
Lymphocytes Relative: 6 %
Lymphs Abs: 0.4 10*3/uL — ABNORMAL LOW (ref 0.7–4.0)
MCH: 34.2 pg — ABNORMAL HIGH (ref 26.0–34.0)
MCHC: 32 g/dL (ref 30.0–36.0)
MCV: 106.7 fL — ABNORMAL HIGH (ref 80.0–100.0)
Monocytes Absolute: 0.5 10*3/uL (ref 0.1–1.0)
Monocytes Relative: 8 %
Neutro Abs: 5.4 10*3/uL (ref 1.7–7.7)
Neutrophils Relative %: 82 %
Platelet Count: 154 10*3/uL (ref 150–400)
RBC: 2.4 MIL/uL — ABNORMAL LOW (ref 3.87–5.11)
RDW: 18.1 % — ABNORMAL HIGH (ref 11.5–15.5)
WBC Count: 6.5 10*3/uL (ref 4.0–10.5)
nRBC: 0.3 % — ABNORMAL HIGH (ref 0.0–0.2)

## 2023-12-19 MED ORDER — SODIUM CHLORIDE 0.9% FLUSH
10.0000 mL | INTRAVENOUS | Status: DC | PRN
Start: 2023-12-19 — End: 2023-12-19
  Administered 2023-12-19: 10 mL

## 2023-12-19 MED ORDER — SODIUM CHLORIDE 0.9% FLUSH
10.0000 mL | Freq: Once | INTRAVENOUS | Status: AC
  Administered 2023-12-19: 10 mL

## 2023-12-19 MED ORDER — HEPARIN SOD (PORK) LOCK FLUSH 100 UNIT/ML IV SOLN
500.0000 [IU] | Freq: Once | INTRAVENOUS | Status: AC | PRN
Start: 2023-12-19 — End: 2023-12-19
  Administered 2023-12-19: 500 [IU]

## 2023-12-19 MED ORDER — SODIUM CHLORIDE 0.9 % IV SOLN
1500.0000 mg | Freq: Once | INTRAVENOUS | Status: AC
  Administered 2023-12-19: 1500 mg via INTRAVENOUS
  Filled 2023-12-19: qty 30

## 2023-12-19 MED ORDER — SODIUM CHLORIDE 0.9 % IV SOLN: INTRAVENOUS | Status: DC

## 2023-12-19 NOTE — Patient Instructions (Signed)
 [facility] - A DEPT OF [facility]  Discharge Instructions: Thank you for choosing [facility] to provide your oncology and hematology care.   If you have a lab appointment with the Cancer Center, please go directly to the Cancer Center and check in at the registration area.   Wear comfortable clothing and clothing appropriate for easy access to any Portacath or PICC line.   We strive to give you quality time with your provider. You may need to reschedule your appointment if you arrive late (15 or more minutes).  Arriving late affects you and other patients whose appointments are after yours.  Also, if you miss three or more appointments without notifying the office, you may be dismissed from the clinic at the provider's discretion.      For prescription refill requests, have your pharmacy contact our office and allow 72 hours for refills to be completed.    Today you received the following chemotherapy and/or immunotherapy agents: Imfinzi      To help prevent nausea and vomiting after your treatment, we encourage you to take your nausea medication as directed.  BELOW ARE SYMPTOMS THAT SHOULD BE REPORTED IMMEDIATELY: *FEVER GREATER THAN 100.4 F (38 C) OR HIGHER *CHILLS OR SWEATING *NAUSEA AND VOMITING THAT IS NOT CONTROLLED WITH YOUR NAUSEA MEDICATION *UNUSUAL SHORTNESS OF BREATH *UNUSUAL BRUISING OR BLEEDING *URINARY PROBLEMS (pain or burning when urinating, or frequent urination) *BOWEL PROBLEMS (unusual diarrhea, constipation, pain near the anus) TENDERNESS IN MOUTH AND THROAT WITH OR WITHOUT PRESENCE OF ULCERS (sore throat, sores in mouth, or a toothache) UNUSUAL RASH, SWELLING OR PAIN  UNUSUAL VAGINAL DISCHARGE OR ITCHING   Items with * indicate a potential emergency and should be followed up as soon as possible or go to the Emergency Department if any problems should occur.  Please show the CHEMOTHERAPY ALERT CARD or IMMUNOTHERAPY  ALERT CARD at check-in to the Emergency Department and triage nurse.  Should you have questions after your visit or need to cancel or reschedule your appointment, please contact [facility] - A DEPT OF [facility]  Dept: [phone]  and follow the prompts.  Office hours are 8:00 a.m. to 4:30 p.m. Monday - Friday. Please note that voicemails left after 4:00 p.m. may not be returned until the following business day.  We are closed weekends and major holidays. You have access to a nurse at all times for urgent questions. Please call the main number to the clinic Dept: [phone] and follow the prompts.   For any non-urgent questions, you may also contact your provider using MyChart. We now offer e-Visits for anyone 55 and older to request care online for non-urgent symptoms. For details visit [email or web address].   Also download the MyChart app! Go to the app store, search "MyChart", open the app, select [place], and log in with your MyChart username and password.

## 2023-12-19 NOTE — Progress Notes (Signed)
 [facility]   [phone] [phone]   Clinic Follow up Note   Patient Care Team: Shon Hale, MD as PCP - General (Family Medicine) Runell Gess, MD as PCP - Cardiology (Cardiology) Malachy Mood, MD as Consulting Physician (Oncology)  Date of Service:  12/19/2023  CHIEF COMPLAINT: f/u of gallbladder cancer  CURRENT THERAPY:  concurrent chemoradiation with Xeloda Maintenance durvalumab every 4 weeks  Oncology History   Gallbladder cancer (HCC) cT4N0M0, stage III -Patient had 1 episode of abdominal pain in October 2023, and was found to have a large gallbladder stone. -She underwent laparoscopic attempted cholecystectomy but was not able to be resected due to adhesions to duodenum and colon. -She developed jaundice in June 2024, status post ERCP and stent placement in CBD.  CT and MRI scan showed a liver mass adjacent to the gallbladder fossae, and a biopsy confirmed adenocarcinoma.  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  Unfortunately this was felt to be not resectable by Dr. Freida Busman. -I reviewed her CT chest from April 22, 2023, which showed no evidence of distant metastasis. -I recommended first-line cisplatin, gemcitabine  every 14 days and durvalumab every 28 days.  If she responded well, will stop chemo after 4 to 6 months of treatment, and changed to maintenance therapy. She started on 05/03/23 every 2 weeks and has been tolerating well.  We have also discussed option of consolidation radiation at some point. -restaging CT 07/08/2023 showed partial response in the primary site tumor with direct invasion to liver.  No new disease or question.   -case reviewed in GI tumor board, consolidation RT is also an option. She started RT on 12/09/2023, will start concurrent Xeloda on 12/09/2023 -plan to continue durvalumab    Assessment and  Plan    Gallbladder cancer Undergoing treatment with oral chemotherapy and radiation therapy. Reports no significant side effects except for skin dryness, managed with lotion and salve, and some fatigue, less severe than previous treatments. Notable weight loss due to decreased appetite, despite maintaining a good diet. Emphasis on maintaining weight with increased liquid nutrition intake. - Continue oral chemotherapy and radiation therapy. - Encourage increased intake of liquid nutrition like Ensure to maintain weight. - Monitor weight and nutritional status. - Use lotion and salve for skin dryness.  Anemia Mild anemia with hemoglobin at 8.2 g/dL, likely due to chemotherapy. Not severe enough for transfusion currently, but will be monitored. Transfusion considered if hemoglobin drops below 8 g/dL or significant fatigue occurs. - Monitor hemoglobin levels closely. - Consider blood transfusion if hemoglobin drops below 8 g/dL or if significant fatigue occurs.  Chronic kidney disease Slightly elevated creatinine levels, possibly due to chemotherapy, previous cisplatin treatment, dehydration, or age-related decline. Reassured that elevation is not concerning and predates cancer diagnosis. - Monitor kidney function regularly. - Ensure adequate hydration.  Plan -She tolerating chemoradiation well, will continue Xeloda -Lab reviewed, adequate for treatment, will proceed durvalumab infusion today and continue every 4 weeks -Follow-up in 2 weeks.     SUMMARY OF ONCOLOGIC HISTORY: Oncology History Overview Note   Cancer Staging  Gallbladder cancer (HCC) Staging form: Distal Bile Duct, AJCC 8th Edition - Clinical stage from 04/09/2023: Stage IIIB (cT4, cN0, cM0) - Signed by Malachy Mood, MD on 04/18/2023 Total positive nodes: 0 Histologic grade (G): G2 Histologic grading system: 3 grade system     Gallbladder cancer (HCC)  04/04/2023 Imaging    IMPRESSION: 1. Ill-defined hypodensity in the  region of the gallbladder fossa appears similar to the prior study given lack of contrast. This would be better evaluated with dedicated MRI. 2. Nonobstructing right renal calculus. 3. Large amount of stool throughout the colon. 4. Stable indeterminate lucent areas in the vertebral bodies of the lumbar spine. 5. Severe atherosclerotic disease.     04/09/2023 Pathology Results     FINAL MICROSCOPIC DIAGNOSIS:   A. LIVER, ADJACENT TO GALLBLADDER FOSSA, BIOPSY:  - Moderately differentiated adenocarcinoma    04/09/2023 Cancer Staging   Staging form: Distal Bile Duct, AJCC 8th Edition - Clinical stage from 04/09/2023: Stage IIIB (cT4, cN0, cM0) - Signed by Malachy Mood, MD on 04/18/2023 Total positive nodes: 0 Histologic grade (G): G2 Histologic grading system: 3 grade system   04/18/2023 Initial Diagnosis   Cholangiocarcinoma of liver (HCC)   05/03/2023 - 11/08/2023 Chemotherapy   Patient is on Treatment Plan : BILIARY TRACT Cisplatin + Gemcitabine D1,15 + Durvalumab (1500) D1 q28d / Durvalumab (1500) q28d     07/08/2023 Imaging   CT Chest abdomen and pelvis with contrast  IMPRESSION: CT CHEST IMPRESSION  1.  No acute process or evidence of metastatic disease in the chest. 2. Left Port-A-Cath tip in azygous vein. This is being adjusted by interventional radiology later today. 3. Ongoing stability of tiny bilateral pulmonary nodules, favored to be benign. 4.  Aortic Atherosclerosis (ICD10-I70.0).  CT ABDOMEN AND PELVIS IMPRESSION  1. The segment 5 liver lesion is decreased in size compared to the MRI of 04/05/2023. 2. Placement of a biliary stent with improvement, near complete resolution of duct dilatation. 3. No abdominopelvic adenopathy. 4. Proximal gastric underdistention. Apparent wall thickening could be secondary. Correlate with symptoms of gastritis 5. Subtle findings within the superior spleen for which small volume interval splenic infarct cannot be excluded. Correlate with left  upper quadrant symptoms. 6.  Possible constipation.     09/27/2023 Imaging   CT chest abdomen and pelvis with contrast  IMPRESSION: CHEST:  1. Stable small pulmonary nodules. 2. No evidence of thoracic metastasis.  PELVIS:  1. Stable hepatic metastasis. 2. No new hepatic lesions. 3. No evidence of metastatic adenopathy in the abdomen pelvis. 4. Intrahepatic and extrahepatic biliary stent in place. No biliary duct dilatation. 5.  Aortic Atherosclerosis (ICD10-I70.0).   11/22/2023 -  Chemotherapy   Patient is on Treatment Plan : LUNG NSCLC Durvalumab (1500) q28d        Discussed the use of AI scribe software for clinical note transcription with the patient, who gave verbal consent to proceed.  History of Present Illness   The patient, an 83 year old with gallbladder cancer, presents for a follow-up visit. She reports discontinuing pantoprazole due to the chemotherapy pill. She has not experienced significant acid reflux since discontinuation. She denies any adverse reactions from radiation therapy and the chemotherapy pill. She has been applying lotion and salve to manage dry skin and abdominal discomfort, respectively. She reports feeling less fatigued than when she was on chemotherapy, although some tiredness persists. She has noticed unintentional weight loss, despite eating well. She attributes this to a lack of appetite and has been supplementing her diet with Ensure. She also reports a history of anemia, which is being monitored.         All other systems were reviewed with the patient and are negative.  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 There is a small calcified mass in the L:A near the LA appendage. No valvular masses seen with associated mitral annular calcification. LA Volume/ BSA27.4 ml/m2 No AS. Right ventricular systolic pressure is elevated at .   Jaundice    Myocardial infarction [facility])    June 2021   Neuromuscular disorder (HCC)    neuropathy in bilateral feet   Peripheral vascular disease (HCC)    Pneumonia    not hosp.    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  PTCAs with drug coated balloon Lt ant tibial artery and Lt popliteal artery  11/26/2019    SURGICAL HISTORY: Past Surgical History:  Procedure Laterality Date   ABDOMINAL AORTAGRAM  11/25/2019   ABDOMINAL AORTOGRAM W/LOWER EXTREMITY    ABDOMINAL AORTOGRAM N/A 06/15/2019   Procedure: ABDOMINAL AORTOGRAM;  Surgeon: Runell Gess, MD;  [facility];  Service: Cardiovascular;  Laterality: N/A;   ABDOMINAL AORTOGRAM W/LOWER EXTREMITY N/A 11/25/2019   Procedure: ABDOMINAL AORTOGRAM W/LOWER EXTREMITY;  Surgeon: Iran Ouch, MD;  [facility]: [facility];  Service: Cardiovascular;  Laterality: N/A;  Lt leg   ABDOMINAL AORTOGRAM W/LOWER EXTREMITY N/A 08/01/2020   Procedure: ABDOMINAL AORTOGRAM W/LOWER EXTREMITY;  Surgeon: Runell Gess, MD;  [facility];  Service: Cardiovascular;  Laterality: N/A;   ABDOMINAL AORTOGRAM W/LOWER EXTREMITY N/A 04/28/2021   Procedure: ABDOMINAL AORTOGRAM W/LOWER EXTREMITY;  Surgeon: Sherren Kerns, MD;  [facility];  Service: Cardiovascular;  Laterality: N/A;   ABDOMINAL AORTOGRAM W/LOWER EXTREMITY N/A 10/13/2021   Procedure: ABDOMINAL AORTOGRAM W/LOWER EXTREMITY;  Surgeon: Leonie Douglas, MD;  [facility];  Service: Cardiovascular;  Laterality: N/A;   ABDOMINAL AORTOGRAM W/LOWER EXTREMITY N/A 11/09/2022   Procedure: ABDOMINAL AORTOGRAM W/LOWER EXTREMITY;  Surgeon: Leonie Douglas, MD;  [facility];  Service: Cardiovascular;  Laterality: N/A;   ABDOMINAL AORTOGRAM W/LOWER EXTREMITY N/A 11/16/2022   Procedure: ABDOMINAL AORTOGRAM W/LOWER EXTREMITY;  Surgeon: Leonie Douglas, MD;  [facility];  Service: Cardiovascular;  Laterality: N/A;   AMPUTATION TOE Left 05/03/2021   Procedure: AMPUTATION OF THIRD LEFT  TOE;  Surgeon: Leonie Douglas, MD;  [facility] OR;  Service: Vascular;  Laterality: Left;   APPENDECTOMY     APPLICATION OF WOUND VAC Right 07/21/2019   Procedure: Application Of Prevena Wound Vac Right Groin;  Surgeon: Nada Libman, MD;  Location: [facility] OR;  Service: Vascular;  Laterality: Right;   BILIARY BRUSHING  04/07/2023   Procedure: BILIARY BRUSHING;  Surgeon: Vida Rigger, MD;  Location: [facility] ENDOSCOPY;  Service: Gastroenterology;;   BILIARY STENT PLACEMENT  04/07/2023   Procedure: BILIARY STENT PLACEMENT;  Surgeon: Vida Rigger, MD;  Location: [facility] ENDOSCOPY;  Service: Gastroenterology;;   CARPAL TUNNEL RELEASE Left    CARPAL TUNNEL RELEASE  Right    CESAREAN SECTION     x 2   CHOLECYSTECTOMY N/A 10/12/2022   Procedure: LAPAROSCOPIC PARTIAL FENESTRATING CHOLECYSTECTOMY;  Surgeon: Fritzi Mandes, MD;  [facility];  Service: General;  Laterality: N/A;   COLONOSCOPY     CORONARY ARTERY BYPASS GRAFT N/A 03/24/2020   Procedure: CORONARY ARTERY BYPASS GRAFTING (CABG) using LIMA to LAD (m); RIMA to RAMUS; Endoscopic Right Greater Saphenous Vein: SVG to Diag1; SVG to PLB (right); and SVG to PL (left).;  Surgeon: Linden Dolin, MD;  [facility]: [facility];  Service: Open Heart Surgery;  Laterality: N/A;  BILATERAL IMA   ENDARTERECTOMY FEMORAL Right 07/21/2019   Procedure: RIGHT ENDARTERECTOMY FEMORAL WITH PATCH  ANGIOPLASTY;  Surgeon: Nada Libman, MD;  [facility];  Service: Vascular;  Laterality: Right;   ENDARTERECTOMY FEMORAL Right 08/16/2020   Procedure: RIGHT FEMORAL ENDARTERECTOMY;  Surgeon: Maeola Harman, MD;  Location: [facility] OR;  Service: Vascular;  Laterality: Right;   ENDOVEIN HARVEST OF GREATER SAPHENOUS VEIN Right 03/24/2020   Procedure: ENDOVEIN HARVEST OF GREATER SAPHENOUS VEIN;  Surgeon: Linden Dolin, MD;  [facility];  Service: Open Heart Surgery;  Laterality: Right;   ERCP N/A 04/07/2023   Procedure: ENDOSCOPIC RETROGRADE CHOLANGIOPANCREATOGRAPHY (ERCP);  Surgeon: Vida Rigger, MD;  Location[facility] ENDOSCOPY;  Service: Gastroenterology;  Laterality: N/A;   EYE SURGERY     cataract removal bilaterally   FEMORAL-POPLITEAL BYPASS GRAFT Right 08/16/2020   Procedure: BYPASS GRAFT FEMORAL-POPLITEAL ARTERY;  Surgeon: Maeola Harman, MD;  Location: [facility] OR;  Service: Vascular;  Laterality: Right;   FEMORAL-POPLITEAL BYPASS GRAFT Left 05/03/2021   Procedure: LEFT FEMORAL TO BELOW KNEE POPLITEAL ARTERY BYPASS GRAFTING WITH 6MMX80 PTFE GRAFT;  Surgeon: Leonie Douglas, MD;  [facility];  Service: Vascular;  Laterality: Left;   LEFT HEART CATH AND CORONARY ANGIOGRAPHY N/A 03/22/2020   Procedure: LEFT HEART CATH AND CORONARY ANGIOGRAPHY;  Surgeon: Swazil[place], Peter M, MD;  Location: [facility] INVASIVE CV LAB;  Service: Cardiovascular;  Laterality: N/A;   LOWER EXTREMITY ANGIOGRAPHY Bilateral 06/15/2019   Procedure: Lower Extremity Angiography;  Surgeon: Runell Gess, MD;  Location: [facility] INVASIVE CV LAB;  Service: Cardiovascular;  Laterality: Bilateral;   LOWER EXTREMITY ANGIOGRAPHY Right 08/03/2019   Procedure: LOWER EXTREMITY ANGIOGRAPHY;  Surgeon: Runell Gess, MD;  [facility];  Service: Cardiovascular;  Laterality: Right;   LOWER EXTREMITY ANGIOGRAPHY N/A 09/07/2021   Procedure: LOWER EXTREMITY ANGIOGRAPHY;  Surgeon: Cephus Shelling, MD;   [facility];  Service: Cardiovascular;  Laterality: N/A;   PATCH ANGIOPLASTY Right 07/21/2019   Procedure: Patch Angioplasty Right Femoral Artery;  Surgeon: Nada Libman, MD;  Location: [facility] OR;  Service: Vascular;  Laterality: Right;   PERIPHERAL INTRAVASCULAR LITHOTRIPSY  11/25/2019   Procedure: INTRAVASCULAR LITHOTRIPSY;  Surgeon: Iran Ouch, MD;  [facility];  Service: Cardiovascular;;  LT. SFA   PERIPHERAL VASCULAR ATHERECTOMY  08/03/2019   Procedure: PERIPHERAL VASCULAR ATHERECTOMY;  Surgeon: Runell Gess, MD;  Location: [facility] INVASIVE CV LAB;  Service: Cardiovascular;;  right SFA, right TP trunk   PERIPHERAL VASCULAR ATHERECTOMY  11/25/2019   Procedure: PERIPHERAL VASCULAR ATHERECTOMY;  Surgeon: Iran Ouch, MD;  [facility];  Service: Cardiovascular;;  Lt.  POPLITEAL and AT   PERIPHERAL VASCULAR BALLOON ANGIOPLASTY Left 06/15/2019   Procedure: PERIPHERAL VASCULAR BALLOON ANGIOPLASTY;  Surgeon: Runell Gess, MD;  [facility];  Service: Cardiovascular;  Laterality: Left;  SFA UNSUCCESSFUL UNABLE TO CROSS LESION   PERIPHERAL VASCULAR BALLOON ANGIOPLASTY  08/03/2019   Procedure: PERIPHERAL VASCULAR BALLOON ANGIOPLASTY;  Surgeon: Runell Gess, MD;  [facility];  Service: Cardiovascular;;  right SFA, Right TP trunk   PERIPHERAL VASCULAR BALLOON ANGIOPLASTY  09/07/2021   Procedure: PERIPHERAL VASCULAR BALLOON ANGIOPLASTY;  Surgeon: Cephus Shelling, MD;  [facility];  Service: Cardiovascular;;   PERIPHERAL VASCULAR BALLOON ANGIOPLASTY Right 10/13/2021   Procedure: PERIPHERAL VASCULAR BALLOON ANGIOPLASTY;  Surgeon: Leonie Douglas, MD;  [facility];  Service: Cardiovascular;  Laterality: Right;   PERIPHERAL VASCULAR BALLOON ANGIOPLASTY  11/09/2022   Procedure: PERIPHERAL VASCULAR BALLOON ANGIOPLASTY;  Surgeon: Leonie Douglas, MD;  [facility];  Service: Cardiovascular;;  fem-pop bypass and AT   PORTACATH PLACEMENT Left 04/29/2023   Procedure: INSERTION PORT-A-CATH;  Surgeon: Fritzi Mandes, MD;  Location: [facility] ORS;  Service: General;  Laterality: Left;   PORTACATH PLACEMENT N/A 07/12/2023   Procedure: REVISION PORT-A-CATH;  Surgeon: Fritzi Mandes, MD;  Location: [facility] OR;  Service: General;  Laterality: N/A;   REMOVAL OF STONES  04/07/2023   Procedure: REMOVAL OF STONES;  Surgeon: Vida Rigger, MD;  Location: [facility] ENDOSCOPY;  Service: Gastroenterology;;   Dennison Mascot  04/07/2023   Procedure: Dennison Mascot;  Surgeon: Vida Rigger, MD;  Location: [facility] ENDOSCOPY;  Service: Gastroenterology;;   TEE WITHOUT CARDIOVERSION N/A 03/24/2020   Procedure: TRANSESOPHAGEAL ECHOCARDIOGRAM (TEE);  Surgeon: Linden Dolin, MD;  Location: [facility] OR;  Service: Open Heart Surgery;  Laterality: N/A;   TONSILLECTOMY     and adenoidectomy   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/2019   Procedure: TRANSMETATARSAL AMPUTATION;  Surgeon: Vivi Barrack, DPM;  Location: [facility] OR;  Service: Podiatry;  Laterality: Right;   TRANSMETATARSAL AMPUTATION Left 05/11/2021   Procedure: LEFT TRANSMETATARSAL AMPUTATION;  Surgeon: Maeola Harman, MD;  Location: [facility] OR;  Service: Vascular;  Laterality: Left;   TUBAL LIGATION      I have reviewed the social history and family history with the patient and they are unchanged from previous note.  ALLERGIES:  is allergic to versed [midazolam], augmentin [amoxicillin-pot clavulanate], bactrim [sulfamethoxazole-trimethoprim], demerol [meperidine], meperidine hcl, scopolamine, and tramadol.  MEDICATIONS:  Current Outpatient Medications  Medication Sig Dispense Refill   aspirin EC 81 MG tablet Take 81 mg by mouth at bedtime. Swallow whole.     atorvastatin (LIPITOR) 80 MG tablet Take 1 tablet (80 mg total) by mouth daily. 90 tablet 3   brimonidine (ALPHAGAN) 0.2 % ophthalmic solution Place 1 drop into the  right eye 2 (two) times daily.     capecitabine (XELODA) 500 MG tablet Take 2 tablets (1000 mg total) by mouth 2 (two) times daily. Take within 30 minutes after meals. Take only on days of radiation, Monday to Friday. 60 tablet 1   empagliflozin (JARDIANCE) 25 MG TABS tablet Take 1 tablet (25 mg total) by mouth daily before breakfast. 90 tablet 3   hydrochlorothiazide (HYDRODIURIL) 12.5 MG tablet Take 1 tablet by mouth once daily 90 tablet 3   Insulin Lispro Prot & Lispro (HUMALOG MIX 75/25 KWIKPEN) (75-25) 100 UNIT/ML Kwikpen INJECT 6 UNITS SUBCUTANEOUSLY ONCE DAILY WITH BREAKFAST 15 mL 4   Insulin Pen Needle (ULTRA-THIN II MINI PEN NEEDLE) 31G X 5 MM MISC Use as directed to check blood glucose 50 each 2   Lancets (ONETOUCH DELICA PLUS LANCET33G) MISC Use to check blood sugars twice daily. (Patient  taking differently: Use to check blood sugars  daily.) 100 each 5   MAGNESIUM-OXIDE 400 (240 Mg) MG tablet Take 1 tablet by mouth twice daily 60 tablet 0   metFORMIN (GLUCOPHAGE) 1000 MG tablet Take 1 tablet (1,000 mg total) by mouth 2 (two) times daily. 180 tablet 2   metoprolol tartrate (LOPRESSOR) 50 MG tablet TAKE 1 TABLET BY MOUTH TWICE DAILY . APPOINTMENT REQUIRED FOR FUTURE REFILLS 30 tablet 0   Multiple Vitamins-Minerals (MULTI FOR HER PO) Take 1 tablet by mouth daily.     mupirocin ointment (BACTROBAN) 2 % Apply 1 Application topically 2 (two) times daily. (Patient taking differently: Apply 1 Application topically 2 (two) times daily as needed (wound care for toes).) 30 g 2   ONETOUCH VERIO test strip USE TO CHECK BLOOD SUGARS ONCE DAILY 100 each 2   Propylene Glycol (SYSTANE BALANCE) 0.6 % SOLN Place 1 drop into the left eye 3 (three) times daily as needed (dry/irritated eyes).     repaglinide (PRANDIN) 1 MG tablet Take 1 tablet (1 mg total) by mouth 2 (two) times daily before a meal. Take 1 tab by breakfast and 1 tab before dinner 180 tablet 4   rivaroxaban (XARELTO) 2.5 MG TABS tablet Take 1  tablet by mouth twice daily 60 tablet 11   telmisartan (MICARDIS) 80 MG tablet TAKE 1 TABLET BY MOUTH ONCE DAILY . APPOINTMENT REQUIRED FOR FUTURE REFILLS 15 tablet 0   No current facility-administered medications for this visit.   Facility-Administered Medications Ordered in Other Visits  Medication Dose Route Frequency Provider Last Rate Last Admin   0.9 %  sodium chloride infusion   Intravenous Continuous Malachy Mood, MD   Stopped at 12/19/23 1258   sodium chloride flush (NS) 0.9 % injection 10 mL  10 mL Intracatheter PRN Malachy Mood, MD   10 mL at 12/19/23 1256    PHYSICAL EXAMINATION: ECOG PERFORMANCE STATUS: 1 - Symptomatic but completely ambulatory  Vitals:   12/19/23 1021  BP: (!) 123/44  Pulse: 79  Resp: 17  Temp: 98.2 F (36.8 C)  SpO2: 100%   Wt Readings from Last 3 Encounters:  12/19/23 133 lb 6.4 oz (60.5 kg)  12/12/23 140 lb 9.6 oz (63.8 kg)  11/28/23 141 lb 8 oz (64.2 kg)     GENERAL:alert, no distress and comfortable SKIN: skin color, texture, turgor are normal, no rashes or significant lesions EYES: normal, Conjunctiva are pink and non-injected, sclera clear NECK: supple, thyroid normal size, non-tender, without nodularity LYMPH:  no palpable lymphadenopathy in the cervical, axillary  LUNGS: clear to auscultation and percussion with normal breathing effort HEART: regular rate & rhythm and no murmurs and no lower extremity edema ABDOMEN:abdomen soft, non-tender and normal bowel sounds Musculoskeletal:no cyanosis of digits and no clubbing  NEURO: alert & oriented x 3 with fluent speech, no focal motor/sensory deficits   LABORATORY DATA:  I have reviewed the data as listed    Latest Ref Rng & Units 12/19/2023    9:57 AM 12/12/2023   12:36 PM 11/28/2023    8:19 AM  CBC  WBC 4.0 - 10.5 K/uL 6.5  6.9  9.5   Hemoglobin 12.0 - 15.0 g/dL 8.2  8.9  8.5   Hematocrit 36.0 - 46.0 % 25.6  28.5  27.0   Platelets 150 - 400 K/uL 154  149  345         Latest Ref Rng &  Units 12/19/2023    9:57 AM 12/12/2023   12:36  PM 11/28/2023    8:19 AM  CMP  Glucose 70 - 99 mg/dL 027  253  664   BUN 8 - 23 mg/dL 35  26  23   Creatinine 0.44 - 1.00 mg/dL 4.03  4.74  2.59   Sodium 135 - 145 mmol/L 138  137  138   Potassium 3.5 - 5.1 mmol/L 4.5  4.7  4.2   Chloride 98 - 111 mmol/L 104  103  103   CO2 22 - 32 mmol/L 30  28  29    Calcium 8.9 - 10.3 mg/dL 9.6  9.7  9.2   Total Protein 6.5 - 8.1 g/dL 6.1  6.3  6.1   Total Bilirubin 0.0 - 1.2 mg/dL 0.6  0.8  0.8   Alkaline Phos 38 - 126 U/L 101  105  84   AST 15 - 41 U/L 28  34  27   ALT 0 - 44 U/L 14  14  11        RADIOGRAPHIC STUDIES: I have personally reviewed the radiological images as listed and agreed with the findings in the report. No results found.    Orders Placed This Encounter  Procedures   CBC with Differential (Cancer Center Only)    Standing Status:   Future    Expected Date:   01/15/2024    Expiration Date:   01/14/2025   CMP (Cancer Center only)    Standing Status:   Future    Expected Date:   01/15/2024    Expiration Date:   01/14/2025   T4    Standing Status:   Future    Expected Date:   01/15/2024    Expiration Date:   01/14/2025   TSH    Standing Status:   Future    Expected Date:   01/15/2024    Expiration Date:   01/14/2025   CBC with Differential (Cancer Center Only)    Standing Status:   Future    Expected Date:   02/12/2024    Expiration Date:   02/11/2025   CMP (Cancer Center only)    Standing Status:   Future    Expected Date:   02/12/2024    Expiration Date:   02/11/2025   CBC with Differential (Cancer Center Only)    Standing Status:   Future    Expected Date:   03/11/2024    Expiration Date:   03/11/2025   CMP (Cancer Center only)    Standing Status:   Future    Expected Date:   03/11/2024    Expiration Date:   03/11/2025   All questions were answered. The patient knows to call the clinic with any problems, questions or concerns. No barriers to learning was detected. The total  time spent in the appointment was 25 minutes.     Malachy Mood, MD 12/19/2023

## 2023-12-19 NOTE — Assessment & Plan Note (Signed)
 cT4N0M0, stage III -Patient had 1 episode of abdominal pain in October 2023, and was found to have a large gallbladder stone. -She underwent laparoscopic attempted cholecystectomy but was not able to be resected due to adhesions to duodenum and colon. -She developed jaundice in June 2024, status post ERCP and stent placement in [place].  CT and MRI scan showed a liver mass adjacent to the gallbladder fossae, and a biopsy confirmed adenocarcinoma.  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  Unfortunately this was felt to be not resectable by Dr. Freida Busman. -I reviewed her CT chest from April 22, 2023, which showed no evidence of distant metastasis. -I recommended first-line cisplatin, gemcitabine  every 14 days and durvalumab every 28 days.  If she responded well, will stop chemo after 4 to 6 months of treatment, and changed to maintenance therapy. She started on 05/03/23 every 2 weeks and has been tolerating well.  We have also discussed option of consolidation radiation at some point. -restaging CT 07/08/2023 showed partial response in the primary site tumor with direct invasion to liver.  No new disease or question.   -case reviewed in GI tumor board, consolidation RT is also an option. She started RT on 12/09/2023, will start concurrent Xeloda on 12/09/2023 -plan to continue durvalumab

## 2023-12-20 ENCOUNTER — Other Ambulatory Visit: Payer: Self-pay

## 2023-12-20 ENCOUNTER — Ambulatory Visit
Admission: RE | Admit: 2023-12-20 | Discharge: 2023-12-20 | Disposition: A | Discharge status: Home / Self Care | Source: Ambulatory Visit | Attending: Radiation Oncology | Admitting: Radiation Oncology

## 2023-12-20 ENCOUNTER — Telehealth: Payer: Self-pay | Admitting: Hematology

## 2023-12-20 DIAGNOSIS — C23 Malignant neoplasm of gallbladder: Secondary | ICD-10-CM | POA: Diagnosis not present

## 2023-12-20 DIAGNOSIS — Z5112 Encounter for antineoplastic immunotherapy: Secondary | ICD-10-CM | POA: Diagnosis not present

## 2023-12-20 DIAGNOSIS — Z510 Encounter for antineoplastic radiation therapy: Secondary | ICD-10-CM | POA: Diagnosis not present

## 2023-12-20 DIAGNOSIS — Z452 Encounter for adjustment and management of vascular access device: Secondary | ICD-10-CM | POA: Diagnosis not present

## 2023-12-20 DIAGNOSIS — D509 Iron deficiency anemia, unspecified: Secondary | ICD-10-CM | POA: Diagnosis not present

## 2023-12-20 DIAGNOSIS — C787 Secondary malignant neoplasm of liver and intrahepatic bile duct: Secondary | ICD-10-CM | POA: Diagnosis not present

## 2023-12-20 LAB — RAD ONC ARIA SESSION SUMMARY
Course Elapsed Days: 11
Plan Fractions Treated to Date: 10
Plan Prescribed Dose Per Fraction: 1.8 Gy
Plan Total Fractions Prescribed: 25
Plan Total Prescribed Dose: 45 Gy
Reference Point Dosage Given to Date: 18 Gy
Reference Point Session Dosage Given: 1.8 Gy
Session Number: 10

## 2023-12-20 NOTE — Telephone Encounter (Signed)
 Scheduled appointments per WQ. Talked with the patient and she is aware of the made appointments.

## 2023-12-21 ENCOUNTER — Other Ambulatory Visit: Payer: Self-pay

## 2023-12-23 ENCOUNTER — Other Ambulatory Visit (HOSPITAL_COMMUNITY): Payer: Self-pay

## 2023-12-23 ENCOUNTER — Ambulatory Visit
Admission: RE | Admit: 2023-12-23 | Discharge: 2023-12-23 | Disposition: A | Discharge status: Home / Self Care | Source: Ambulatory Visit | Attending: Radiation Oncology

## 2023-12-23 ENCOUNTER — Other Ambulatory Visit: Payer: Self-pay

## 2023-12-23 DIAGNOSIS — Z510 Encounter for antineoplastic radiation therapy: Secondary | ICD-10-CM | POA: Diagnosis not present

## 2023-12-23 DIAGNOSIS — C23 Malignant neoplasm of gallbladder: Secondary | ICD-10-CM | POA: Diagnosis not present

## 2023-12-23 DIAGNOSIS — Z452 Encounter for adjustment and management of vascular access device: Secondary | ICD-10-CM | POA: Diagnosis not present

## 2023-12-23 DIAGNOSIS — Z5112 Encounter for antineoplastic immunotherapy: Secondary | ICD-10-CM | POA: Diagnosis not present

## 2023-12-23 DIAGNOSIS — C787 Secondary malignant neoplasm of liver and intrahepatic bile duct: Secondary | ICD-10-CM | POA: Diagnosis not present

## 2023-12-23 DIAGNOSIS — D509 Iron deficiency anemia, unspecified: Secondary | ICD-10-CM | POA: Diagnosis not present

## 2023-12-23 LAB — RAD ONC ARIA SESSION SUMMARY
Course Elapsed Days: 14
Plan Fractions Treated to Date: 11
Plan Prescribed Dose Per Fraction: 1.8 Gy
Plan Total Fractions Prescribed: 25
Plan Total Prescribed Dose: 45 Gy
Reference Point Dosage Given to Date: 19.8 Gy
Reference Point Session Dosage Given: 1.8 Gy
Session Number: 11

## 2023-12-23 NOTE — Progress Notes (Signed)
 Specialty Pharmacy Refill Coordination Note  [name] is a 83 y.o. female contacted today regarding refills of specialty medication(s) Capecitabine (XELODA)   Patient requested Pickup at [name] at [place] date: 12/27/23   Medication will be filled on 12/26/23.

## 2023-12-23 NOTE — Progress Notes (Signed)
 Specialty Pharmacy Ongoing Clinical Assessment Note  [name] is a 83 y.o. female who is being followed by the specialty pharmacy service for [facility] Oncology   Patient's specialty medication(s) reviewed today: Capecitabine (XELODA)   Missed doses in the last 4 weeks: 0   Patient/Caregiver asked additional questions regarding prevention of hand/foot syndrome. See intervention notes below.  Therapeutic benefit summary: Unable to assess   Adverse events/side effects summary: Experienced adverse events/side effects (fatigue)   Patient's therapy is appropriate to: Continue    Goals Addressed             This Visit's Progress    Slow Disease Progression   No change    Patient is initiating therapy. Patient will maintain adherence.         Follow up:  3 months  Servando Snare Specialty Pharmacist    Clinical Intervention Note  Clinical Intervention Notes: Patient started Pepcid, no DDIs were identified. Patient was also counseled on use of Utter[employer] Smooth to keep hands and feet moisturized.   Clinical Intervention Outcomes: Prevention of an adverse drug event   Eulah Citizen

## 2023-12-24 ENCOUNTER — Ambulatory Visit
Admission: RE | Admit: 2023-12-24 | Discharge: 2023-12-24 | Disposition: A | Discharge status: Home / Self Care | Source: Ambulatory Visit | Attending: Radiation Oncology | Admitting: Radiation Oncology

## 2023-12-24 ENCOUNTER — Other Ambulatory Visit: Payer: Self-pay

## 2023-12-24 DIAGNOSIS — C221 Intrahepatic bile duct carcinoma: Secondary | ICD-10-CM | POA: Diagnosis not present

## 2023-12-24 DIAGNOSIS — Z510 Encounter for antineoplastic radiation therapy: Secondary | ICD-10-CM | POA: Insufficient documentation

## 2023-12-24 DIAGNOSIS — Z5112 Encounter for antineoplastic immunotherapy: Secondary | ICD-10-CM | POA: Diagnosis not present

## 2023-12-24 DIAGNOSIS — Z79620 Long term (current) use of immunosuppressive biologic: Secondary | ICD-10-CM | POA: Diagnosis not present

## 2023-12-24 DIAGNOSIS — C23 Malignant neoplasm of gallbladder: Secondary | ICD-10-CM | POA: Diagnosis not present

## 2023-12-24 DIAGNOSIS — D509 Iron deficiency anemia, unspecified: Secondary | ICD-10-CM | POA: Insufficient documentation

## 2023-12-24 DIAGNOSIS — Z452 Encounter for adjustment and management of vascular access device: Secondary | ICD-10-CM | POA: Insufficient documentation

## 2023-12-24 DIAGNOSIS — C787 Secondary malignant neoplasm of liver and intrahepatic bile duct: Secondary | ICD-10-CM | POA: Insufficient documentation

## 2023-12-24 LAB — RAD ONC ARIA SESSION SUMMARY
Course Elapsed Days: 15
Plan Fractions Treated to Date: 12
Plan Prescribed Dose Per Fraction: 1.8 Gy
Plan Total Fractions Prescribed: 25
Plan Total Prescribed Dose: 45 Gy
Reference Point Dosage Given to Date: 21.6 Gy
Reference Point Session Dosage Given: 1.8 Gy
Session Number: 12

## 2023-12-25 ENCOUNTER — Ambulatory Visit
Admission: RE | Admit: 2023-12-25 | Discharge: 2023-12-25 | Disposition: A | Discharge status: Home / Self Care | Source: Ambulatory Visit | Attending: Radiation Oncology | Admitting: Radiation Oncology

## 2023-12-25 ENCOUNTER — Other Ambulatory Visit: Payer: Self-pay

## 2023-12-25 DIAGNOSIS — C221 Intrahepatic bile duct carcinoma: Secondary | ICD-10-CM | POA: Diagnosis not present

## 2023-12-25 DIAGNOSIS — C23 Malignant neoplasm of gallbladder: Secondary | ICD-10-CM | POA: Diagnosis not present

## 2023-12-25 DIAGNOSIS — Z452 Encounter for adjustment and management of vascular access device: Secondary | ICD-10-CM | POA: Diagnosis not present

## 2023-12-25 DIAGNOSIS — Z510 Encounter for antineoplastic radiation therapy: Secondary | ICD-10-CM | POA: Diagnosis not present

## 2023-12-25 DIAGNOSIS — D509 Iron deficiency anemia, unspecified: Secondary | ICD-10-CM | POA: Diagnosis not present

## 2023-12-25 DIAGNOSIS — Z5112 Encounter for antineoplastic immunotherapy: Secondary | ICD-10-CM | POA: Diagnosis not present

## 2023-12-25 LAB — RAD ONC ARIA SESSION SUMMARY
Course Elapsed Days: 16
Plan Fractions Treated to Date: 13
Plan Prescribed Dose Per Fraction: 1.8 Gy
Plan Total Fractions Prescribed: 25
Plan Total Prescribed Dose: 45 Gy
Reference Point Dosage Given to Date: 23.4 Gy
Reference Point Session Dosage Given: 1.8 Gy
Session Number: 13

## 2023-12-26 ENCOUNTER — Ambulatory Visit
Admission: RE | Admit: 2023-12-26 | Discharge: 2023-12-26 | Disposition: A | Discharge status: Home / Self Care | Source: Ambulatory Visit | Attending: Radiation Oncology | Admitting: Radiation Oncology

## 2023-12-26 ENCOUNTER — Other Ambulatory Visit: Payer: Self-pay

## 2023-12-26 DIAGNOSIS — Z510 Encounter for antineoplastic radiation therapy: Secondary | ICD-10-CM | POA: Diagnosis not present

## 2023-12-26 DIAGNOSIS — C221 Intrahepatic bile duct carcinoma: Secondary | ICD-10-CM | POA: Diagnosis not present

## 2023-12-26 DIAGNOSIS — C23 Malignant neoplasm of gallbladder: Secondary | ICD-10-CM | POA: Diagnosis not present

## 2023-12-26 DIAGNOSIS — Z452 Encounter for adjustment and management of vascular access device: Secondary | ICD-10-CM | POA: Diagnosis not present

## 2023-12-26 DIAGNOSIS — D509 Iron deficiency anemia, unspecified: Secondary | ICD-10-CM | POA: Diagnosis not present

## 2023-12-26 DIAGNOSIS — Z5112 Encounter for antineoplastic immunotherapy: Secondary | ICD-10-CM | POA: Diagnosis not present

## 2023-12-26 LAB — RAD ONC ARIA SESSION SUMMARY
Course Elapsed Days: 17
Plan Fractions Treated to Date: 14
Plan Prescribed Dose Per Fraction: 1.8 Gy
Plan Total Fractions Prescribed: 25
Plan Total Prescribed Dose: 45 Gy
Reference Point Dosage Given to Date: 25.2 Gy
Reference Point Session Dosage Given: 1.8 Gy
Session Number: 14

## 2023-12-27 ENCOUNTER — Ambulatory Visit
Admission: RE | Admit: 2023-12-27 | Discharge: 2023-12-27 | Disposition: A | Discharge status: Home / Self Care | Source: Ambulatory Visit | Attending: Radiation Oncology | Admitting: Radiation Oncology

## 2023-12-27 ENCOUNTER — Other Ambulatory Visit: Payer: Medicare Other

## 2023-12-27 ENCOUNTER — Other Ambulatory Visit: Payer: Self-pay

## 2023-12-27 DIAGNOSIS — Z452 Encounter for adjustment and management of vascular access device: Secondary | ICD-10-CM | POA: Diagnosis not present

## 2023-12-27 DIAGNOSIS — D509 Iron deficiency anemia, unspecified: Secondary | ICD-10-CM | POA: Diagnosis not present

## 2023-12-27 DIAGNOSIS — C23 Malignant neoplasm of gallbladder: Secondary | ICD-10-CM | POA: Diagnosis not present

## 2023-12-27 DIAGNOSIS — Z5112 Encounter for antineoplastic immunotherapy: Secondary | ICD-10-CM | POA: Diagnosis not present

## 2023-12-27 DIAGNOSIS — Z510 Encounter for antineoplastic radiation therapy: Secondary | ICD-10-CM | POA: Diagnosis not present

## 2023-12-27 DIAGNOSIS — C221 Intrahepatic bile duct carcinoma: Secondary | ICD-10-CM | POA: Diagnosis not present

## 2023-12-27 LAB — RAD ONC ARIA SESSION SUMMARY
Course Elapsed Days: 18
Plan Fractions Treated to Date: 15
Plan Prescribed Dose Per Fraction: 1.8 Gy
Plan Total Fractions Prescribed: 25
Plan Total Prescribed Dose: 45 Gy
Reference Point Dosage Given to Date: 27 Gy
Reference Point Session Dosage Given: 1.8 Gy
Session Number: 15

## 2023-12-30 ENCOUNTER — Other Ambulatory Visit: Payer: Self-pay

## 2023-12-30 ENCOUNTER — Ambulatory Visit
Admission: RE | Admit: 2023-12-30 | Discharge: 2023-12-30 | Disposition: A | Discharge status: Home / Self Care | Source: Ambulatory Visit | Attending: Radiation Oncology

## 2023-12-30 DIAGNOSIS — C23 Malignant neoplasm of gallbladder: Secondary | ICD-10-CM

## 2023-12-30 DIAGNOSIS — Z452 Encounter for adjustment and management of vascular access device: Secondary | ICD-10-CM | POA: Diagnosis not present

## 2023-12-30 DIAGNOSIS — C221 Intrahepatic bile duct carcinoma: Secondary | ICD-10-CM | POA: Diagnosis not present

## 2023-12-30 DIAGNOSIS — Z5112 Encounter for antineoplastic immunotherapy: Secondary | ICD-10-CM | POA: Diagnosis not present

## 2023-12-30 DIAGNOSIS — Z510 Encounter for antineoplastic radiation therapy: Secondary | ICD-10-CM | POA: Diagnosis not present

## 2023-12-30 DIAGNOSIS — D509 Iron deficiency anemia, unspecified: Secondary | ICD-10-CM | POA: Diagnosis not present

## 2023-12-30 LAB — RAD ONC ARIA SESSION SUMMARY
Course Elapsed Days: 21
Plan Fractions Treated to Date: 16
Plan Prescribed Dose Per Fraction: 1.8 Gy
Plan Total Fractions Prescribed: 25
Plan Total Prescribed Dose: 45 Gy
Reference Point Dosage Given to Date: 28.8 Gy
Reference Point Session Dosage Given: 1.8 Gy
Session Number: 16

## 2023-12-30 NOTE — Assessment & Plan Note (Signed)
 cT4N0M0, stage III -Patient had 1 episode of abdominal pain in October 2023, and was found to have a large gallbladder stone. -She underwent laparoscopic attempted cholecystectomy but was not able to be resected due to adhesions to duodenum and colon. -She developed jaundice in June 2024, status post ERCP and stent placement in [place].  CT and MRI scan showed a liver mass adjacent to the gallbladder fossae, and a biopsy confirmed adenocarcinoma.  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  Unfortunately this was felt to be not resectable by Dr. Freida Busman. -I reviewed her CT chest from April 22, 2023, which showed no evidence of distant metastasis. -I recommended first-line cisplatin, gemcitabine  every 14 days and durvalumab every 28 days.  If she responded well, will stop chemo after 4 to 6 months of treatment, and changed to maintenance therapy. She started on 05/03/23 every 2 weeks and has been tolerating well.  We have also discussed option of consolidation radiation at some point. -restaging CT 07/08/2023 showed partial response in the primary site tumor with direct invasion to liver.  No new disease or question.   -case reviewed in GI tumor board, consolidation RT is also an option. She started RT and Xeloda on 12/09/2023 -plan to continue durvalumab

## 2023-12-31 ENCOUNTER — Other Ambulatory Visit: Payer: Self-pay

## 2023-12-31 ENCOUNTER — Inpatient Hospital Stay (HOSPITAL_BASED_OUTPATIENT_CLINIC_OR_DEPARTMENT_OTHER): Admitting: Hematology

## 2023-12-31 ENCOUNTER — Ambulatory Visit
Admission: RE | Admit: 2023-12-31 | Discharge: 2023-12-31 | Disposition: A | Discharge status: Home / Self Care | Source: Ambulatory Visit | Attending: Radiation Oncology | Admitting: Radiation Oncology

## 2023-12-31 ENCOUNTER — Encounter: Payer: Self-pay | Admitting: Hematology

## 2023-12-31 ENCOUNTER — Inpatient Hospital Stay

## 2023-12-31 VITALS — BP 144/50 | HR 76 | Temp 97.7°F | Resp 17 | Ht 65.5 in | Wt 131.0 lb

## 2023-12-31 DIAGNOSIS — D509 Iron deficiency anemia, unspecified: Secondary | ICD-10-CM | POA: Diagnosis not present

## 2023-12-31 DIAGNOSIS — Z79620 Long term (current) use of immunosuppressive biologic: Secondary | ICD-10-CM | POA: Insufficient documentation

## 2023-12-31 DIAGNOSIS — C23 Malignant neoplasm of gallbladder: Secondary | ICD-10-CM | POA: Diagnosis not present

## 2023-12-31 DIAGNOSIS — Z5112 Encounter for antineoplastic immunotherapy: Secondary | ICD-10-CM | POA: Insufficient documentation

## 2023-12-31 DIAGNOSIS — Z510 Encounter for antineoplastic radiation therapy: Secondary | ICD-10-CM | POA: Diagnosis not present

## 2023-12-31 DIAGNOSIS — C221 Intrahepatic bile duct carcinoma: Secondary | ICD-10-CM | POA: Diagnosis not present

## 2023-12-31 DIAGNOSIS — Z452 Encounter for adjustment and management of vascular access device: Secondary | ICD-10-CM | POA: Insufficient documentation

## 2023-12-31 DIAGNOSIS — Z95828 Presence of other vascular implants and grafts: Secondary | ICD-10-CM

## 2023-12-31 LAB — CBC WITH DIFFERENTIAL (CANCER CENTER ONLY)
Abs Immature Granulocytes: 0.02 10*3/uL (ref 0.00–0.07)
Basophils Absolute: 0 10*3/uL (ref 0.0–0.1)
Basophils Relative: 0 %
Eosinophils Absolute: 0.2 10*3/uL (ref 0.0–0.5)
Eosinophils Relative: 3 %
HCT: 25 % — ABNORMAL LOW (ref 36.0–46.0)
Hemoglobin: 8.4 g/dL — ABNORMAL LOW (ref 12.0–15.0)
Immature Granulocytes: 0 %
Lymphocytes Relative: 5 %
Lymphs Abs: 0.4 10*3/uL — ABNORMAL LOW (ref 0.7–4.0)
MCH: 35.1 pg — ABNORMAL HIGH (ref 26.0–34.0)
MCHC: 33.6 g/dL (ref 30.0–36.0)
MCV: 104.6 fL — ABNORMAL HIGH (ref 80.0–100.0)
Monocytes Absolute: 0.8 10*3/uL (ref 0.1–1.0)
Monocytes Relative: 11 %
Neutro Abs: 6.4 10*3/uL (ref 1.7–7.7)
Neutrophils Relative %: 81 %
Platelet Count: 169 10*3/uL (ref 150–400)
RBC: 2.39 MIL/uL — ABNORMAL LOW (ref 3.87–5.11)
RDW: 19.3 % — ABNORMAL HIGH (ref 11.5–15.5)
WBC Count: 7.9 10*3/uL (ref 4.0–10.5)
nRBC: 0.3 % — ABNORMAL HIGH (ref 0.0–0.2)

## 2023-12-31 LAB — RAD ONC ARIA SESSION SUMMARY
Course Elapsed Days: 22
Plan Fractions Treated to Date: 17
Plan Prescribed Dose Per Fraction: 1.8 Gy
Plan Total Fractions Prescribed: 25
Plan Total Prescribed Dose: 45 Gy
Reference Point Dosage Given to Date: 30.6 Gy
Reference Point Session Dosage Given: 1.8 Gy
Session Number: 17

## 2023-12-31 LAB — CMP (CANCER CENTER ONLY)
ALT: 16 U/L (ref 0–44)
AST: 29 U/L (ref 15–41)
Albumin: 3.7 g/dL (ref 3.5–5.0)
Alkaline Phosphatase: 110 U/L (ref 38–126)
Anion gap: 6 (ref 5–15)
BUN: 33 mg/dL — ABNORMAL HIGH (ref 8–23)
CO2: 27 mmol/L (ref 22–32)
Calcium: 9.8 mg/dL (ref 8.9–10.3)
Chloride: 104 mmol/L (ref 98–111)
Creatinine: 1.06 mg/dL — ABNORMAL HIGH (ref 0.44–1.00)
GFR, Estimated: 53 mL/min — ABNORMAL LOW (ref >60)
Glucose, Bld: 129 mg/dL — ABNORMAL HIGH (ref 70–99)
Potassium: 4.2 mmol/L (ref 3.5–5.1)
Sodium: 137 mmol/L (ref 135–145)
Total Bilirubin: 0.7 mg/dL (ref 0.0–1.2)
Total Protein: 6.7 g/dL (ref 6.5–8.1)

## 2023-12-31 LAB — SAMPLE TO BLOOD BANK

## 2023-12-31 MED ORDER — SODIUM CHLORIDE 0.9% FLUSH
10.0000 mL | Freq: Once | INTRAVENOUS | Status: AC
  Administered 2023-12-31: 10 mL

## 2023-12-31 MED ORDER — HEPARIN SOD (PORK) LOCK FLUSH 100 UNIT/ML IV SOLN
500.0000 [IU] | Freq: Once | INTRAVENOUS | Status: AC
  Administered 2023-12-31: 500 [IU]

## 2023-12-31 NOTE — Progress Notes (Signed)
 [facility]   Telephone:(336) [phone] Fax:(336) [phone]   Clinic Follow up Note   Patient Care Team: Shon Hale, MD as PCP - General (Family Medicine) Runell Gess, MD as PCP - Cardiology (Cardiology) Malachy Mood, MD as Consulting Physician (Oncology)  Date of Service:  12/31/2023  CHIEF COMPLAINT: f/u of unresectable gallbladder cancer  CURRENT THERAPY:  Concurrent chemoradiation with capecitabine  Oncology History   Gallbladder cancer (HCC) cT4N0M0, stage III -Patient had 1 episode of abdominal pain in October 2023, and was found to have a large gallbladder stone. -She underwent laparoscopic attempted cholecystectomy but was not able to be resected due to adhesions to duodenum and colon. -She developed jaundice in June 2024, status post ERCP and stent placement in CBD.  CT and MRI scan showed a liver mass adjacent to the gallbladder fossae, and a biopsy confirmed adenocarcinoma.  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  Unfortunately this was felt to be not resectable by Dr. Freida Busman. -I reviewed her CT chest from April 22, 2023, which showed no evidence of distant metastasis. -I recommended first-line cisplatin, gemcitabine  every 14 days and durvalumab every 28 days.  If she responded well, will stop chemo after 4 to 6 months of treatment, and changed to maintenance therapy. She started on 05/03/23 every 2 weeks and has been tolerating well.  We have also discussed option of consolidation radiation at some point. -restaging CT 07/08/2023 showed partial response in the primary site tumor with direct invasion to liver.  No new disease or question.   -case reviewed in GI tumor board, consolidation RT is also an option. She started RT and Xeloda on 12/09/2023 -plan to continue durvalumab   Assessment & Plan Gallbladder cancer Undergoing treatment for  gallbladder cancer, which is incurable but treatable with the aim of achieving remission. Currently receiving radiation therapy, oral chemotherapy with capecitabine, and immunotherapy. Recent cancer antigen level was 447, the lowest recorded, indicating a positive response to treatment. Experiencing intermittent muscular pain in the right shoulder to neck area, likely related to radiation therapy. Kidney and liver functions are well-managed, and blood counts are at 8.4, not requiring transfusion. Remission is the goal, though the cancer is incurable. A PET scan and circulating tumor DNA test will be considered if CT scan is negative to assess remission status. - Continue radiation therapy and immunotherapy. - Discontinue capecitabine after radiation therapy completion. - Order CT scan four weeks after radiation therapy completion. - Plan PET scan and circulating tumor DNA test if CT scan is negative. - Monitor cancer antigen levels monthly. - Advise use of heating pad for muscular pain. - Instruct to report any severe side effects such as dehydration, pain, or diarrhea. - Schedule follow-up appointment in two weeks.   Plan -She is tolerating chemoradiation well overall, will continue -Lab and follow-up in 2 weeks before next cycle durvalumab -Repeat CT abdomen pelvis in 4 weeks  SUMMARY OF ONCOLOGIC HISTORY: Oncology History Overview Note   Cancer Staging  Gallbladder cancer [facility]) Staging form: Distal Bile Duct, AJCC 8th Edition - Clinical stage from 04/09/2023: Stage IIIB (cT4, cN0, cM0) - Signed by Malachy Mood, MD on 04/18/2023 Total positive nodes: 0 Histologic grade (G): G2 Histologic grading system: 3 grade system     Gallbladder cancer (HCC)  04/04/2023 Imaging    IMPRESSION: 1. Ill-defined hypodensity in the region of the gallbladder fossa appears  similar to the prior study given lack of contrast. This would be better evaluated with dedicated MRI. 2. Nonobstructing right renal  calculus. 3. Large amount of stool throughout the colon. 4. Stable indeterminate lucent areas in the vertebral bodies of the lumbar spine. 5. Severe atherosclerotic disease.     04/09/2023 Pathology Results     FINAL MICROSCOPIC DIAGNOSIS:   A. LIVER, ADJACENT TO GALLBLADDER FOSSA, BIOPSY:  - Moderately differentiated adenocarcinoma    04/09/2023 Cancer Staging   Staging form: Distal Bile Duct, AJCC 8th Edition - Clinical stage from 04/09/2023: Stage IIIB (cT4, cN0, cM0) - Signed by Malachy Mood, MD on 04/18/2023 Total positive nodes: 0 Histologic grade (G): G2 Histologic grading system: 3 grade system   04/18/2023 Initial Diagnosis   Cholangiocarcinoma of liver (HCC)   05/03/2023 - 11/08/2023 Chemotherapy   Patient is on Treatment Plan : BILIARY TRACT Cisplatin + Gemcitabine D1,15 + Durvalumab (1500) D1 q28d / Durvalumab (1500) q28d     07/08/2023 Imaging   CT Chest abdomen and pelvis with contrast  IMPRESSION: CT CHEST IMPRESSION  1.  No acute process or evidence of metastatic disease in the chest. 2. Left Port-A-Cath tip in azygous vein. This is being adjusted by interventional radiology later today. 3. Ongoing stability of tiny bilateral pulmonary nodules, favored to be benign. 4.  Aortic Atherosclerosis (ICD10-I70.0).  CT ABDOMEN AND PELVIS IMPRESSION  1. The segment 5 liver lesion is decreased in size compared to the MRI of 04/05/2023. 2. Placement of a biliary stent with improvement, near complete resolution of duct dilatation. 3. No abdominopelvic adenopathy. 4. Proximal gastric underdistention. Apparent wall thickening could be secondary. Correlate with symptoms of gastritis 5. Subtle findings within the superior spleen for which small volume interval splenic infarct cannot be excluded. Correlate with left upper quadrant symptoms. 6.  Possible constipation.     09/27/2023 Imaging   CT chest abdomen and pelvis with contrast  IMPRESSION: CHEST:  1. Stable small pulmonary  nodules. 2. No evidence of thoracic metastasis.  PELVIS:  1. Stable hepatic metastasis. 2. No new hepatic lesions. 3. No evidence of metastatic adenopathy in the abdomen pelvis. 4. Intrahepatic and extrahepatic biliary stent in place. No biliary duct dilatation. 5.  Aortic Atherosclerosis (ICD10-I70.0).   11/22/2023 -  Chemotherapy   Patient is on Treatment Plan : LUNG NSCLC Durvalumab (1500) q28d        Discussed the use of AI scribe software for clinical note transcription with the patient, who gave verbal consent to proceed.  History of Present Illness The patient, an 83 year old female with gallbladder cancer, presents for follow-up after recent radiation therapy. She reports a new onset of intermittent burning pain in the right shoulder to neck area, which started a week ago. The pain is not associated with any visible skin changes and is not tender to touch. She finds relief with massage and has not tried a heating pad. She also reports longstanding numbness in the right hand, which has not worsened since starting chemotherapy.  The patient's kidney and liver function are good, and blood counts are stable. She denies feeling exhausted, but admits to some fatigue. She denies bloating or queasiness, but occasionally experiences some abdominal discomfort and mild pain. Bowel movements are regular with no constipation or diarrhea.  The patient completed radiation therapy two weeks ago and is also receiving immunotherapy. She was on oral chemotherapy (capecitabine) during radiation therapy.     All other systems were reviewed with the patient and are negative.  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 There is a small calcified mass in the L:A near the LA appendage. No valvular masses seen with associated mitral annular calcification. LA Volume/ BSA27.4 ml/m2 No AS. Right ventricular systolic pressure is elevated at .   Jaundice    Myocardial infarction [facility]    June 2021   Neuromuscular disorder (HCC)    neuropathy in bilateral feet   Peripheral vascular disease (HCC)    Pneumonia    not hosp.    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  PTCAs with drug coated balloon Lt ant tibial artery and Lt popliteal artery  11/26/2019    SURGICAL HISTORY: Past Surgical History:  Procedure Laterality Date   ABDOMINAL AORTAGRAM  11/25/2019   ABDOMINAL AORTOGRAM W/LOWER EXTREMITY    ABDOMINAL AORTOGRAM N/A 06/15/2019   Procedure: ABDOMINAL AORTOGRAM;  Surgeon: Runell Gess, MD;  [facility];  Service: Cardiovascular;  Laterality: N/A;   ABDOMINAL AORTOGRAM W/LOWER EXTREMITY N/A 11/25/2019   Procedure: ABDOMINAL AORTOGRAM W/LOWER EXTREMITY;  Surgeon: Iran Ouch, MD;  Location: [facility];  Service: Cardiovascular;  Laterality: N/A;  Lt leg   ABDOMINAL AORTOGRAM W/LOWER EXTREMITY N/A 08/01/2020   Procedure: ABDOMINAL AORTOGRAM W/LOWER EXTREMITY;  Surgeon: Runell Gess, MD;  [facility]: [facility];  Service: Cardiovascular;  Laterality: N/A;   ABDOMINAL AORTOGRAM W/LOWER EXTREMITY N/A 04/28/2021   Procedure: ABDOMINAL AORTOGRAM W/LOWER EXTREMITY;  Surgeon: Sherren Kerns, MD;  [facility];  Service: Cardiovascular;  Laterality: N/A;   ABDOMINAL AORTOGRAM W/LOWER EXTREMITY N/A 10/13/2021   Procedure: ABDOMINAL  AORTOGRAM W/LOWER EXTREMITY;  Surgeon: Leonie Douglas, MD;  [facility];  Service: Cardiovascular;  Laterality: N/A;   ABDOMINAL AORTOGRAM W/LOWER EXTREMITY N/A 11/09/2022   Procedure: ABDOMINAL AORTOGRAM W/LOWER EXTREMITY;  Surgeon: Leonie Douglas, MD;  [facility];  Service: Cardiovascular;  Laterality: N/A;   ABDOMINAL AORTOGRAM W/LOWER EXTREMITY N/A 11/16/2022   Procedure: ABDOMINAL AORTOGRAM W/LOWER EXTREMITY;  Surgeon: Leonie Douglas, MD;  [facility];  Service: Cardiovascular;  Laterality: N/A;   AMPUTATION TOE Left 05/03/2021   Procedure: AMPUTATION OF THIRD LEFT  TOE;  Surgeon: Leonie Douglas, MD;  Location: [facility] OR;  Service: Vascular;  Laterality: Left;   APPENDECTOMY     APPLICATION OF WOUND VAC Right 07/21/2019   Procedure: Application Of Prevena Wound Vac Right Groin;  Surgeon: Nada Libman, MD;  Location: [facility] OR;  Service: Vascular;  Laterality: Right;   BILIARY BRUSHING  04/07/2023   Procedure: BILIARY BRUSHING;  Surgeon: Vida Rigger, MD;  Location: [facility] ENDOSCOPY;  Service: Gastroenterology;;   BILIARY STENT PLACEMENT  04/07/2023   Procedure: BILIARY STENT PLACEMENT;  Surgeon: Vida Rigger, MD;  [facility];  Service: Gastroenterology;;   CARPAL TUNNEL RELEASE Left    CARPAL TUNNEL RELEASE Right    CESAREAN SECTION     x 2   CHOLECYSTECTOMY N/A 10/12/2022   Procedure: LAPAROSCOPIC PARTIAL FENESTRATING CHOLECYSTECTOMY;  Surgeon: Fritzi Mandes, MD;  [facility];  Service: General;  Laterality: N/A;   COLONOSCOPY     CORONARY ARTERY BYPASS GRAFT N/A 03/24/2020   Procedure: CORONARY ARTERY BYPASS GRAFTING (CABG) using LIMA to LAD (m); RIMA to RAMUS; Endoscopic Right Greater Saphenous Vein: SVG to Diag1; SVG to PLB (right); and SVG to PL (left).;  Surgeon: Linden Dolin, MD;  Location: [facility];  Service: Open Heart Surgery;  Laterality: N/A;  BILATERAL IMA   ENDARTERECTOMY FEMORAL Right 07/21/2019   Procedure:  RIGHT ENDARTERECTOMY FEMORAL WITH PATCH ANGIOPLASTY;  Surgeon: Nada Libman, MD;  [facility];  Service: Vascular;  Laterality: Right;   ENDARTERECTOMY FEMORAL Right 08/16/2020   Procedure: RIGHT FEMORAL ENDARTERECTOMY;  Surgeon: Maeola Harman, MD;  Location: [facility] OR;  Service: Vascular;  Laterality: Right;   ENDOVEIN HARVEST OF GREATER SAPHENOUS VEIN Right 03/24/2020   Procedure: ENDOVEIN HARVEST OF GREATER SAPHENOUS VEIN;  Surgeon: Linden Dolin, MD;  [facility];  Service: Open Heart Surgery;  Laterality: Right;   ERCP N/A 04/07/2023   Procedure: ENDOSCOPIC RETROGRADE CHOLANGIOPANCREATOGRAPHY (ERCP);  Surgeon: Vida Rigger, MD;  Location: [facility] ENDOSCOPY;  Service: Gastroenterology;  Laterality: N/A;   EYE SURGERY     cataract removal bilaterally   FEMORAL-POPLITEAL BYPASS GRAFT Right 08/16/2020   Procedure: BYPASS GRAFT FEMORAL-POPLITEAL ARTERY;  Surgeon: Maeola Harman, MD;  Location: [facility] OR;  Service: Vascular;  Laterality: Right;   FEMORAL-POPLITEAL BYPASS GRAFT Left 05/03/2021   Procedure: LEFT FEMORAL TO BELOW KNEE POPLITEAL ARTERY BYPASS GRAFTING WITH 6MMX80 PTFE GRAFT;  Surgeon: Leonie Douglas, MD;  [facility];  Service: Vascular;  Laterality: Left;   LEFT HEART CATH AND CORONARY ANGIOGRAPHY N/A 03/22/2020   Procedure: LEFT HEART CATH AND CORONARY ANGIOGRAPHY;  Surgeon: Swazil[place], Peter M, MD;  Location: [facility] INVASIVE CV LAB;  Service: Cardiovascular;  Laterality: N/A;   LOWER EXTREMITY ANGIOGRAPHY Bilateral 06/15/2019   Procedure: Lower Extremity Angiography;  Surgeon: Runell Gess, MD;  Location: [facility] INVASIVE CV LAB;  Service: Cardiovascular;  Laterality: Bilateral;   LOWER EXTREMITY ANGIOGRAPHY Right 08/03/2019   Procedure: LOWER EXTREMITY ANGIOGRAPHY;  Surgeon: Runell Gess, MD;  [facility];  Service: Cardiovascular;  Laterality: Right;   LOWER EXTREMITY ANGIOGRAPHY N/A 09/07/2021   Procedure: LOWER EXTREMITY ANGIOGRAPHY;   Surgeon: Cephus Shelling, MD;  [facility];  Service: Cardiovascular;  Laterality: N/A;   PATCH ANGIOPLASTY Right 07/21/2019   Procedure: Patch Angioplasty Right Femoral Artery;  Surgeon: Nada Libman, MD;  Location: [facility] OR;  Service: Vascular;  Laterality: Right;   PERIPHERAL INTRAVASCULAR LITHOTRIPSY  11/25/2019   Procedure: INTRAVASCULAR LITHOTRIPSY;  Surgeon: Iran Ouch, MD;  [facility];  Service: Cardiovascular;;  LT. SFA   PERIPHERAL VASCULAR ATHERECTOMY  08/03/2019   Procedure: PERIPHERAL VASCULAR ATHERECTOMY;  Surgeon: Runell Gess, MD;  Location: [facility] INVASIVE CV LAB;  Service: Cardiovascular;;  right SFA, right TP trunk   PERIPHERAL VASCULAR ATHERECTOMY  11/25/2019   Procedure: PERIPHERAL VASCULAR ATHERECTOMY;  Surgeon: Iran Ouch, MD;  [facility];  Service: Cardiovascular;;  Lt.  POPLITEAL and AT   PERIPHERAL VASCULAR BALLOON ANGIOPLASTY Left 06/15/2019   Procedure: PERIPHERAL  VASCULAR BALLOON ANGIOPLASTY;  Surgeon: Runell Gess, MD;  Location: [facility] INVASIVE CV LAB;  Service: Cardiovascular;  Laterality: Left;  SFA UNSUCCESSFUL UNABLE TO CROSS LESION   PERIPHERAL VASCULAR BALLOON ANGIOPLASTY  08/03/2019   Procedure: PERIPHERAL VASCULAR BALLOON ANGIOPLASTY;  Surgeon: Runell Gess, MD;  [facility];  Service: Cardiovascular;;  right SFA, Right TP trunk   PERIPHERAL VASCULAR BALLOON ANGIOPLASTY  09/07/2021   Procedure: PERIPHERAL VASCULAR BALLOON ANGIOPLASTY;  Surgeon: Cephus Shelling, MD;  [facility];  Service: Cardiovascular;;   PERIPHERAL VASCULAR BALLOON ANGIOPLASTY Right 10/13/2021   Procedure: PERIPHERAL VASCULAR BALLOON ANGIOPLASTY;  Surgeon: Leonie Douglas, MD;  [facility];  Service: Cardiovascular;  Laterality: Right;   PERIPHERAL VASCULAR BALLOON ANGIOPLASTY  11/09/2022   Procedure: PERIPHERAL VASCULAR BALLOON ANGIOPLASTY;  Surgeon:  Leonie Douglas, MD;  [facility];  Service: Cardiovascular;;  fem-pop bypass and AT   PORTACATH PLACEMENT Left 04/29/2023   Procedure: INSERTION PORT-A-CATH;  Surgeon: Fritzi Mandes, MD;  Location: [facility] ORS;  Service: General;  Laterality: Left;   PORTACATH PLACEMENT N/A 07/12/2023   Procedure: REVISION PORT-A-CATH;  Surgeon: Fritzi Mandes, MD;  Location: [facility] OR;  Service: General;  Laterality: N/A;   REMOVAL OF STONES  04/07/2023   Procedure: REMOVAL OF STONES;  Surgeon: Vida Rigger, MD;  Location: [facility] ENDOSCOPY;  Service: Gastroenterology;;   Dennison Mascot  04/07/2023   Procedure: Dennison Mascot;  Surgeon: Vida Rigger, MD;  Location: [facility] ENDOSCOPY;  Service: Gastroenterology;;   TEE WITHOUT CARDIOVERSION N/A 03/24/2020   Procedure: TRANSESOPHAGEAL ECHOCARDIOGRAM (TEE);  Surgeon: Linden Dolin, MD;  Location: [facility] OR;  Service: Open Heart Surgery;  Laterality: N/A;   TONSILLECTOMY     and adenoidectomy   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/2019   Procedure: TRANSMETATARSAL AMPUTATION;  Surgeon: Vivi Barrack, DPM;  Location: [facility] OR;  Service: Podiatry;  Laterality: Right;   TRANSMETATARSAL AMPUTATION Left 05/11/2021   Procedure: LEFT TRANSMETATARSAL AMPUTATION;  Surgeon: Maeola Harman, MD;  Location: [facility] OR;  Service: Vascular;  Laterality: Left;   TUBAL LIGATION      I have reviewed the social history and family history with the patient and they are unchanged from previous note.  ALLERGIES:  is allergic to versed [midazolam], augmentin [amoxicillin-pot clavulanate], bactrim [sulfamethoxazole-trimethoprim], demerol [meperidine], meperidine hcl, scopolamine, and tramadol.  MEDICATIONS:  Current Outpatient Medications  Medication Sig Dispense Refill   aspirin EC 81 MG tablet Take 81 mg by mouth at bedtime. Swallow whole.     atorvastatin (LIPITOR) 80 MG tablet Take 1 tablet (80 mg total) by mouth daily. 90 tablet 3   brimonidine (ALPHAGAN) 0.2 %  ophthalmic solution Place 1 drop into the right eye 2 (two) times daily.     capecitabine (XELODA) 500 MG tablet Take 2 tablets (1000 mg total) by mouth 2 (two) times daily. Take within 30 minutes after meals. Take only on days of radiation, Monday to Friday. 60 tablet 1   empagliflozin (JARDIANCE) 25 MG TABS tablet Take 1 tablet (25 mg total) by mouth daily before breakfast. 90 tablet 3   hydrochlorothiazide (HYDRODIURIL) 12.5 MG tablet Take 1 tablet by mouth once daily 90 tablet 3   Insulin Lispro Prot & Lispro (HUMALOG MIX 75/25 KWIKPEN) (75-25) 100 UNIT/ML Kwikpen INJECT 6 UNITS SUBCUTANEOUSLY ONCE DAILY WITH BREAKFAST 15 mL 4   Insulin Pen Needle (ULTRA-THIN II MINI PEN NEEDLE) 31G X 5 MM MISC Use as directed to check blood glucose  50 each 2   Lancets (ONETOUCH DELICA PLUS LANCET33G) MISC Use to check blood sugars twice daily. (Patient taking differently: Use to check blood sugars  daily.) 100 each 5   MAGNESIUM-OXIDE 400 (240 Mg) MG tablet Take 1 tablet by mouth twice daily 60 tablet 0   metFORMIN (GLUCOPHAGE) 1000 MG tablet Take 1 tablet (1,000 mg total) by mouth 2 (two) times daily. 180 tablet 2   metoprolol tartrate (LOPRESSOR) 50 MG tablet TAKE 1 TABLET BY MOUTH TWICE DAILY . APPOINTMENT REQUIRED FOR FUTURE REFILLS 30 tablet 0   Multiple Vitamins-Minerals (MULTI FOR HER PO) Take 1 tablet by mouth daily.     mupirocin ointment (BACTROBAN) 2 % Apply 1 Application topically 2 (two) times daily. (Patient taking differently: Apply 1 Application topically 2 (two) times daily as needed (wound care for toes).) 30 g 2   ONETOUCH VERIO test strip USE TO CHECK BLOOD SUGARS ONCE DAILY 100 each 2   Propylene Glycol (SYSTANE BALANCE) 0.6 % SOLN Place 1 drop into the left eye 3 (three) times daily as needed (dry/irritated eyes).     repaglinide (PRANDIN) 1 MG tablet Take 1 tablet (1 mg total) by mouth 2 (two) times daily before a meal. Take 1 tab by breakfast and 1 tab before dinner 180 tablet 4    rivaroxaban (XARELTO) 2.5 MG TABS tablet Take 1 tablet by mouth twice daily 60 tablet 11   telmisartan (MICARDIS) 80 MG tablet TAKE 1 TABLET BY MOUTH ONCE DAILY . APPOINTMENT REQUIRED FOR FUTURE REFILLS 15 tablet 0   No current facility-administered medications for this visit.    PHYSICAL EXAMINATION: ECOG PERFORMANCE STATUS: 2 - Symptomatic, <50% confined to bed  Vitals:   12/31/23 1405  BP: (!) 144/50  Pulse: 76  Resp: 17  Temp: 97.7 F (36.5 C)  SpO2: 100%   Wt Readings from Last 3 Encounters:  12/31/23 131 lb (59.4 kg)  12/19/23 133 lb 6.4 oz (60.5 kg)  12/12/23 140 lb 9.6 oz (63.8 kg)     GENERAL:alert, no distress and comfortable SKIN: skin color, texture, turgor are normal, no rashes or significant lesions EYES: normal, Conjunctiva are pink and non-injected, sclera clear NECK: supple, thyroid normal size, non-tender, without nodularity LYMPH:  no palpable lymphadenopathy in the cervical, axillary  LUNGS: clear to auscultation and percussion with normal breathing effort HEART: regular rate & rhythm and no murmurs and no lower extremity edema ABDOMEN:abdomen soft, non-tender and normal bowel sounds Musculoskeletal:no cyanosis of digits and no clubbing  NEURO: alert & oriented x 3 with fluent speech, no focal motor/sensory deficits  Physical Exam    LABORATORY DATA:  I have reviewed the data as listed    Latest Ref Rng & Units 12/31/2023   12:45 PM 12/19/2023    9:57 AM 12/12/2023   12:36 PM  CBC  WBC 4.0 - 10.5 K/uL 7.9  6.5  6.9   Hemoglobin 12.0 - 15.0 g/dL 8.4  8.2  8.9   Hematocrit 36.0 - 46.0 % 25.0  25.6  28.5   Platelets 150 - 400 K/uL 169  154  149         Latest Ref Rng & Units 12/31/2023   12:45 PM 12/19/2023    9:57 AM 12/12/2023   12:36 PM  CMP  Glucose 70 - 99 mg/dL 604  540  981   BUN 8 - 23 mg/dL 33  35  26   Creatinine 0.44 - 1.00 mg/dL 1.91  4.78  2.95  Sodium 135 - 145 mmol/L 137  138  137   Potassium 3.5 - 5.1 mmol/L 4.2  4.5  4.7    Chloride 98 - 111 mmol/L 104  104  103   CO2 22 - 32 mmol/L 27  30  28    Calcium 8.9 - 10.3 mg/dL 9.8  9.6  9.7   Total Protein 6.5 - 8.1 g/dL 6.7  6.1  6.3   Total Bilirubin 0.0 - 1.2 mg/dL 0.7  0.6  0.8   Alkaline Phos 38 - 126 U/L 110  101  105   AST 15 - 41 U/L 29  28  34   ALT 0 - 44 U/L 16  14  14        RADIOGRAPHIC STUDIES: I have personally reviewed the radiological images as listed and agreed with the findings in the report. No results found.    Orders Placed This Encounter  Procedures   CT ABDOMEN PELVIS W CONTRAST    Standing Status:   Future    Expected Date:   01/30/2024    Expiration Date:   12/30/2024    If indicated for the ordered procedure, I authorize the administration of contrast media per Radiology protocol:   Yes    Does the patient have a contrast media/X-ray dye allergy?:   Yes    Preferred imaging location?:   [facility]    If indicated for the ordered procedure, I authorize the administration of oral contrast media per Radiology protocol:   Yes   All questions were answered. The patient knows to call the clinic with any problems, questions or concerns. No barriers to learning was detected. The total time spent in the appointment was 25 minutes.     Malachy Mood, MD 12/31/2023

## 2024-01-01 ENCOUNTER — Ambulatory Visit
Admission: RE | Admit: 2024-01-01 | Discharge: 2024-01-01 | Disposition: A | Discharge status: Home / Self Care | Source: Ambulatory Visit | Attending: Radiation Oncology | Admitting: Radiation Oncology

## 2024-01-01 ENCOUNTER — Other Ambulatory Visit: Payer: Self-pay

## 2024-01-01 ENCOUNTER — Ambulatory Visit: Payer: Medicare Other | Admitting: Endocrinology

## 2024-01-01 DIAGNOSIS — Z5112 Encounter for antineoplastic immunotherapy: Secondary | ICD-10-CM | POA: Diagnosis not present

## 2024-01-01 DIAGNOSIS — D509 Iron deficiency anemia, unspecified: Secondary | ICD-10-CM | POA: Diagnosis not present

## 2024-01-01 DIAGNOSIS — Z510 Encounter for antineoplastic radiation therapy: Secondary | ICD-10-CM | POA: Diagnosis not present

## 2024-01-01 DIAGNOSIS — C221 Intrahepatic bile duct carcinoma: Secondary | ICD-10-CM | POA: Diagnosis not present

## 2024-01-01 DIAGNOSIS — Z452 Encounter for adjustment and management of vascular access device: Secondary | ICD-10-CM | POA: Diagnosis not present

## 2024-01-01 DIAGNOSIS — C23 Malignant neoplasm of gallbladder: Secondary | ICD-10-CM | POA: Diagnosis not present

## 2024-01-01 LAB — RAD ONC ARIA SESSION SUMMARY
Course Elapsed Days: 23
Plan Fractions Treated to Date: 18
Plan Prescribed Dose Per Fraction: 1.8 Gy
Plan Total Fractions Prescribed: 25
Plan Total Prescribed Dose: 45 Gy
Reference Point Dosage Given to Date: 32.4 Gy
Reference Point Session Dosage Given: 1.8 Gy
Session Number: 18

## 2024-01-02 ENCOUNTER — Other Ambulatory Visit: Payer: Self-pay

## 2024-01-02 ENCOUNTER — Ambulatory Visit
Admission: RE | Admit: 2024-01-02 | Discharge: 2024-01-02 | Discharge status: Home / Self Care | Source: Ambulatory Visit | Attending: Radiation Oncology

## 2024-01-02 DIAGNOSIS — Z452 Encounter for adjustment and management of vascular access device: Secondary | ICD-10-CM | POA: Diagnosis not present

## 2024-01-02 DIAGNOSIS — Z5112 Encounter for antineoplastic immunotherapy: Secondary | ICD-10-CM | POA: Diagnosis not present

## 2024-01-02 DIAGNOSIS — Z510 Encounter for antineoplastic radiation therapy: Secondary | ICD-10-CM | POA: Diagnosis not present

## 2024-01-02 DIAGNOSIS — D509 Iron deficiency anemia, unspecified: Secondary | ICD-10-CM | POA: Diagnosis not present

## 2024-01-02 DIAGNOSIS — C221 Intrahepatic bile duct carcinoma: Secondary | ICD-10-CM | POA: Diagnosis not present

## 2024-01-02 DIAGNOSIS — C23 Malignant neoplasm of gallbladder: Secondary | ICD-10-CM | POA: Diagnosis not present

## 2024-01-02 LAB — RAD ONC ARIA SESSION SUMMARY
Course Elapsed Days: 24
Plan Fractions Treated to Date: 19
Plan Prescribed Dose Per Fraction: 1.8 Gy
Plan Total Fractions Prescribed: 25
Plan Total Prescribed Dose: 45 Gy
Reference Point Dosage Given to Date: 34.2 Gy
Reference Point Session Dosage Given: 1.8 Gy
Session Number: 19

## 2024-01-03 ENCOUNTER — Ambulatory Visit
Admission: RE | Admit: 2024-01-03 | Discharge: 2024-01-03 | Disposition: A | Discharge status: Home / Self Care | Source: Ambulatory Visit | Attending: Radiation Oncology | Admitting: Radiation Oncology

## 2024-01-03 ENCOUNTER — Other Ambulatory Visit: Payer: Self-pay

## 2024-01-03 DIAGNOSIS — D509 Iron deficiency anemia, unspecified: Secondary | ICD-10-CM | POA: Diagnosis not present

## 2024-01-03 DIAGNOSIS — C23 Malignant neoplasm of gallbladder: Secondary | ICD-10-CM | POA: Diagnosis not present

## 2024-01-03 DIAGNOSIS — C221 Intrahepatic bile duct carcinoma: Secondary | ICD-10-CM | POA: Diagnosis not present

## 2024-01-03 DIAGNOSIS — Z510 Encounter for antineoplastic radiation therapy: Secondary | ICD-10-CM | POA: Diagnosis not present

## 2024-01-03 DIAGNOSIS — Z452 Encounter for adjustment and management of vascular access device: Secondary | ICD-10-CM | POA: Diagnosis not present

## 2024-01-03 DIAGNOSIS — Z5112 Encounter for antineoplastic immunotherapy: Secondary | ICD-10-CM | POA: Diagnosis not present

## 2024-01-03 LAB — RAD ONC ARIA SESSION SUMMARY
Course Elapsed Days: 25
Plan Fractions Treated to Date: 20
Plan Prescribed Dose Per Fraction: 1.8 Gy
Plan Total Fractions Prescribed: 25
Plan Total Prescribed Dose: 45 Gy
Reference Point Dosage Given to Date: 36 Gy
Reference Point Session Dosage Given: 1.8 Gy
Session Number: 20

## 2024-01-06 ENCOUNTER — Other Ambulatory Visit: Payer: Self-pay

## 2024-01-06 ENCOUNTER — Ambulatory Visit
Admission: RE | Admit: 2024-01-06 | Discharge: 2024-01-06 | Disposition: A | Discharge status: Home / Self Care | Source: Ambulatory Visit | Attending: Radiation Oncology | Admitting: Radiation Oncology

## 2024-01-06 DIAGNOSIS — Z510 Encounter for antineoplastic radiation therapy: Secondary | ICD-10-CM | POA: Diagnosis not present

## 2024-01-06 DIAGNOSIS — Z5112 Encounter for antineoplastic immunotherapy: Secondary | ICD-10-CM | POA: Diagnosis not present

## 2024-01-06 DIAGNOSIS — C23 Malignant neoplasm of gallbladder: Secondary | ICD-10-CM | POA: Diagnosis not present

## 2024-01-06 DIAGNOSIS — Z452 Encounter for adjustment and management of vascular access device: Secondary | ICD-10-CM | POA: Diagnosis not present

## 2024-01-06 DIAGNOSIS — D509 Iron deficiency anemia, unspecified: Secondary | ICD-10-CM | POA: Diagnosis not present

## 2024-01-06 DIAGNOSIS — C221 Intrahepatic bile duct carcinoma: Secondary | ICD-10-CM | POA: Diagnosis not present

## 2024-01-06 LAB — RAD ONC ARIA SESSION SUMMARY
Course Elapsed Days: 28
Plan Fractions Treated to Date: 21
Plan Prescribed Dose Per Fraction: 1.8 Gy
Plan Total Fractions Prescribed: 25
Plan Total Prescribed Dose: 45 Gy
Reference Point Dosage Given to Date: 37.8 Gy
Reference Point Session Dosage Given: 1.8 Gy
Session Number: 21

## 2024-01-07 ENCOUNTER — Other Ambulatory Visit: Payer: Self-pay

## 2024-01-07 ENCOUNTER — Other Ambulatory Visit: Payer: Self-pay | Admitting: Cardiovascular Disease

## 2024-01-07 ENCOUNTER — Ambulatory Visit
Admission: RE | Admit: 2024-01-07 | Discharge: 2024-01-07 | Disposition: A | Discharge status: Home / Self Care | Source: Ambulatory Visit | Attending: Radiation Oncology | Admitting: Radiation Oncology

## 2024-01-07 DIAGNOSIS — D509 Iron deficiency anemia, unspecified: Secondary | ICD-10-CM | POA: Diagnosis not present

## 2024-01-07 DIAGNOSIS — Z452 Encounter for adjustment and management of vascular access device: Secondary | ICD-10-CM | POA: Diagnosis not present

## 2024-01-07 DIAGNOSIS — C23 Malignant neoplasm of gallbladder: Secondary | ICD-10-CM | POA: Diagnosis not present

## 2024-01-07 DIAGNOSIS — Z510 Encounter for antineoplastic radiation therapy: Secondary | ICD-10-CM | POA: Diagnosis not present

## 2024-01-07 DIAGNOSIS — C221 Intrahepatic bile duct carcinoma: Secondary | ICD-10-CM | POA: Diagnosis not present

## 2024-01-07 DIAGNOSIS — Z5112 Encounter for antineoplastic immunotherapy: Secondary | ICD-10-CM | POA: Diagnosis not present

## 2024-01-07 LAB — RAD ONC ARIA SESSION SUMMARY
Course Elapsed Days: 29
Plan Fractions Treated to Date: 22
Plan Prescribed Dose Per Fraction: 1.8 Gy
Plan Total Fractions Prescribed: 25
Plan Total Prescribed Dose: 45 Gy
Reference Point Dosage Given to Date: 39.6 Gy
Reference Point Session Dosage Given: 1.8 Gy
Session Number: 22

## 2024-01-08 ENCOUNTER — Other Ambulatory Visit: Payer: Self-pay

## 2024-01-08 ENCOUNTER — Ambulatory Visit
Admission: RE | Admit: 2024-01-08 | Discharge: 2024-01-08 | Disposition: A | Discharge status: Home / Self Care | Source: Ambulatory Visit | Attending: Radiation Oncology | Admitting: Radiation Oncology

## 2024-01-08 DIAGNOSIS — Z452 Encounter for adjustment and management of vascular access device: Secondary | ICD-10-CM | POA: Diagnosis not present

## 2024-01-08 DIAGNOSIS — Z5112 Encounter for antineoplastic immunotherapy: Secondary | ICD-10-CM | POA: Diagnosis not present

## 2024-01-08 DIAGNOSIS — Z510 Encounter for antineoplastic radiation therapy: Secondary | ICD-10-CM | POA: Diagnosis not present

## 2024-01-08 DIAGNOSIS — C23 Malignant neoplasm of gallbladder: Secondary | ICD-10-CM | POA: Diagnosis not present

## 2024-01-08 DIAGNOSIS — D509 Iron deficiency anemia, unspecified: Secondary | ICD-10-CM | POA: Diagnosis not present

## 2024-01-08 DIAGNOSIS — C221 Intrahepatic bile duct carcinoma: Secondary | ICD-10-CM | POA: Diagnosis not present

## 2024-01-08 LAB — RAD ONC ARIA SESSION SUMMARY
Course Elapsed Days: 30
Plan Fractions Treated to Date: 23
Plan Prescribed Dose Per Fraction: 1.8 Gy
Plan Total Fractions Prescribed: 25
Plan Total Prescribed Dose: 45 Gy
Reference Point Dosage Given to Date: 41.4 Gy
Reference Point Session Dosage Given: 1.8 Gy
Session Number: 23

## 2024-01-09 ENCOUNTER — Other Ambulatory Visit: Payer: Self-pay

## 2024-01-09 ENCOUNTER — Ambulatory Visit
Admission: RE | Admit: 2024-01-09 | Discharge: 2024-01-09 | Disposition: A | Discharge status: Home / Self Care | Source: Ambulatory Visit | Attending: Radiation Oncology | Admitting: Radiation Oncology

## 2024-01-09 DIAGNOSIS — C23 Malignant neoplasm of gallbladder: Secondary | ICD-10-CM | POA: Diagnosis not present

## 2024-01-09 DIAGNOSIS — Z510 Encounter for antineoplastic radiation therapy: Secondary | ICD-10-CM | POA: Diagnosis not present

## 2024-01-09 DIAGNOSIS — C221 Intrahepatic bile duct carcinoma: Secondary | ICD-10-CM | POA: Diagnosis not present

## 2024-01-09 DIAGNOSIS — D509 Iron deficiency anemia, unspecified: Secondary | ICD-10-CM | POA: Diagnosis not present

## 2024-01-09 DIAGNOSIS — Z452 Encounter for adjustment and management of vascular access device: Secondary | ICD-10-CM | POA: Diagnosis not present

## 2024-01-09 DIAGNOSIS — Z5112 Encounter for antineoplastic immunotherapy: Secondary | ICD-10-CM | POA: Diagnosis not present

## 2024-01-09 LAB — RAD ONC ARIA SESSION SUMMARY
Course Elapsed Days: 31
Plan Fractions Treated to Date: 24
Plan Prescribed Dose Per Fraction: 1.8 Gy
Plan Total Fractions Prescribed: 25
Plan Total Prescribed Dose: 45 Gy
Reference Point Dosage Given to Date: 43.2 Gy
Reference Point Session Dosage Given: 1.8 Gy
Session Number: 24

## 2024-01-10 ENCOUNTER — Ambulatory Visit
Admission: RE | Admit: 2024-01-10 | Discharge: 2024-01-10 | Disposition: A | Discharge status: Home / Self Care | Source: Ambulatory Visit | Attending: Radiation Oncology | Admitting: Radiation Oncology

## 2024-01-10 ENCOUNTER — Other Ambulatory Visit: Payer: Self-pay

## 2024-01-10 DIAGNOSIS — Z452 Encounter for adjustment and management of vascular access device: Secondary | ICD-10-CM | POA: Diagnosis not present

## 2024-01-10 DIAGNOSIS — C23 Malignant neoplasm of gallbladder: Secondary | ICD-10-CM | POA: Diagnosis not present

## 2024-01-10 DIAGNOSIS — Z510 Encounter for antineoplastic radiation therapy: Secondary | ICD-10-CM | POA: Diagnosis not present

## 2024-01-10 DIAGNOSIS — Z5112 Encounter for antineoplastic immunotherapy: Secondary | ICD-10-CM | POA: Diagnosis not present

## 2024-01-10 DIAGNOSIS — C221 Intrahepatic bile duct carcinoma: Secondary | ICD-10-CM | POA: Diagnosis not present

## 2024-01-10 DIAGNOSIS — D509 Iron deficiency anemia, unspecified: Secondary | ICD-10-CM | POA: Diagnosis not present

## 2024-01-10 LAB — RAD ONC ARIA SESSION SUMMARY
Course Elapsed Days: 32
Plan Fractions Treated to Date: 25
Plan Prescribed Dose Per Fraction: 1.8 Gy
Plan Total Fractions Prescribed: 25
Plan Total Prescribed Dose: 45 Gy
Reference Point Dosage Given to Date: 45 Gy
Reference Point Session Dosage Given: 1.8 Gy
Session Number: 25

## 2024-01-13 ENCOUNTER — Other Ambulatory Visit: Payer: Self-pay

## 2024-01-13 ENCOUNTER — Ambulatory Visit
Admission: RE | Admit: 2024-01-13 | Discharge: 2024-01-13 | Disposition: A | Discharge status: Home / Self Care | Source: Ambulatory Visit | Attending: Radiation Oncology | Admitting: Radiation Oncology

## 2024-01-13 DIAGNOSIS — C221 Intrahepatic bile duct carcinoma: Secondary | ICD-10-CM | POA: Diagnosis not present

## 2024-01-13 DIAGNOSIS — C23 Malignant neoplasm of gallbladder: Secondary | ICD-10-CM | POA: Diagnosis not present

## 2024-01-13 DIAGNOSIS — Z510 Encounter for antineoplastic radiation therapy: Secondary | ICD-10-CM | POA: Diagnosis not present

## 2024-01-13 DIAGNOSIS — D509 Iron deficiency anemia, unspecified: Secondary | ICD-10-CM | POA: Diagnosis not present

## 2024-01-13 DIAGNOSIS — Z5112 Encounter for antineoplastic immunotherapy: Secondary | ICD-10-CM | POA: Diagnosis not present

## 2024-01-13 DIAGNOSIS — Z452 Encounter for adjustment and management of vascular access device: Secondary | ICD-10-CM | POA: Diagnosis not present

## 2024-01-13 LAB — RAD ONC ARIA SESSION SUMMARY
Course Elapsed Days: 35
Plan Fractions Treated to Date: 1
Plan Prescribed Dose Per Fraction: 1.8 Gy
Plan Total Fractions Prescribed: 3
Plan Total Prescribed Dose: 5.4 Gy
Reference Point Dosage Given to Date: 1.8 Gy
Reference Point Session Dosage Given: 1.8 Gy
Session Number: 26

## 2024-01-14 ENCOUNTER — Ambulatory Visit
Admission: RE | Admit: 2024-01-14 | Discharge: 2024-01-14 | Disposition: A | Discharge status: Home / Self Care | Source: Ambulatory Visit | Attending: Radiation Oncology | Admitting: Radiation Oncology

## 2024-01-14 ENCOUNTER — Other Ambulatory Visit: Payer: Self-pay

## 2024-01-14 DIAGNOSIS — D509 Iron deficiency anemia, unspecified: Secondary | ICD-10-CM | POA: Diagnosis not present

## 2024-01-14 DIAGNOSIS — Z510 Encounter for antineoplastic radiation therapy: Secondary | ICD-10-CM | POA: Diagnosis not present

## 2024-01-14 DIAGNOSIS — C23 Malignant neoplasm of gallbladder: Secondary | ICD-10-CM | POA: Diagnosis not present

## 2024-01-14 DIAGNOSIS — Z452 Encounter for adjustment and management of vascular access device: Secondary | ICD-10-CM | POA: Diagnosis not present

## 2024-01-14 DIAGNOSIS — Z5112 Encounter for antineoplastic immunotherapy: Secondary | ICD-10-CM | POA: Diagnosis not present

## 2024-01-14 DIAGNOSIS — C221 Intrahepatic bile duct carcinoma: Secondary | ICD-10-CM | POA: Diagnosis not present

## 2024-01-14 LAB — RAD ONC ARIA SESSION SUMMARY
Course Elapsed Days: 36
Plan Fractions Treated to Date: 2
Plan Prescribed Dose Per Fraction: 1.8 Gy
Plan Total Fractions Prescribed: 3
Plan Total Prescribed Dose: 5.4 Gy
Reference Point Dosage Given to Date: 3.6 Gy
Reference Point Session Dosage Given: 1.8 Gy
Session Number: 27

## 2024-01-15 ENCOUNTER — Ambulatory Visit
Admission: RE | Admit: 2024-01-15 | Discharge: 2024-01-15 | Disposition: A | Discharge status: Home / Self Care | Source: Ambulatory Visit | Attending: Radiation Oncology | Admitting: Radiation Oncology

## 2024-01-15 ENCOUNTER — Inpatient Hospital Stay (HOSPITAL_BASED_OUTPATIENT_CLINIC_OR_DEPARTMENT_OTHER): Admitting: Nurse Practitioner

## 2024-01-15 ENCOUNTER — Encounter: Payer: Self-pay | Admitting: Nurse Practitioner

## 2024-01-15 ENCOUNTER — Other Ambulatory Visit: Payer: Self-pay

## 2024-01-15 ENCOUNTER — Inpatient Hospital Stay

## 2024-01-15 ENCOUNTER — Other Ambulatory Visit: Payer: Self-pay | Admitting: Nurse Practitioner

## 2024-01-15 VITALS — BP 145/52 | HR 77 | Temp 97.8°F | Resp 18 | Wt 131.0 lb

## 2024-01-15 VITALS — BP 145/52 | HR 77 | Temp 97.8°F | Resp 18

## 2024-01-15 DIAGNOSIS — C221 Intrahepatic bile duct carcinoma: Secondary | ICD-10-CM | POA: Diagnosis not present

## 2024-01-15 DIAGNOSIS — Z510 Encounter for antineoplastic radiation therapy: Secondary | ICD-10-CM | POA: Diagnosis not present

## 2024-01-15 DIAGNOSIS — C23 Malignant neoplasm of gallbladder: Secondary | ICD-10-CM

## 2024-01-15 DIAGNOSIS — Z452 Encounter for adjustment and management of vascular access device: Secondary | ICD-10-CM | POA: Diagnosis not present

## 2024-01-15 DIAGNOSIS — Z95828 Presence of other vascular implants and grafts: Secondary | ICD-10-CM

## 2024-01-15 DIAGNOSIS — Z5112 Encounter for antineoplastic immunotherapy: Secondary | ICD-10-CM | POA: Diagnosis not present

## 2024-01-15 DIAGNOSIS — D509 Iron deficiency anemia, unspecified: Secondary | ICD-10-CM | POA: Diagnosis not present

## 2024-01-15 LAB — CMP (CANCER CENTER ONLY)
ALT: 12 U/L (ref 0–44)
AST: 22 U/L (ref 15–41)
Albumin: 3.4 g/dL — ABNORMAL LOW (ref 3.5–5.0)
Alkaline Phosphatase: 97 U/L (ref 38–126)
Anion gap: 5 (ref 5–15)
BUN: 65 mg/dL — ABNORMAL HIGH (ref 8–23)
CO2: 25 mmol/L (ref 22–32)
Calcium: 10.3 mg/dL (ref 8.9–10.3)
Chloride: 106 mmol/L (ref 98–111)
Creatinine: 1.12 mg/dL — ABNORMAL HIGH (ref 0.44–1.00)
GFR, Estimated: 49 mL/min — ABNORMAL LOW (ref >60)
Glucose, Bld: 238 mg/dL — ABNORMAL HIGH (ref 70–99)
Potassium: 4.5 mmol/L (ref 3.5–5.1)
Sodium: 136 mmol/L (ref 135–145)
Total Bilirubin: 0.5 mg/dL (ref 0.0–1.2)
Total Protein: 6.1 g/dL — ABNORMAL LOW (ref 6.5–8.1)

## 2024-01-15 LAB — CBC WITH DIFFERENTIAL (CANCER CENTER ONLY)
Abs Immature Granulocytes: 0.04 10*3/uL (ref 0.00–0.07)
Basophils Absolute: 0 10*3/uL (ref 0.0–0.1)
Basophils Relative: 0 %
Eosinophils Absolute: 0.1 10*3/uL (ref 0.0–0.5)
Eosinophils Relative: 1 %
HCT: 25.7 % — ABNORMAL LOW (ref 36.0–46.0)
Hemoglobin: 8.7 g/dL — ABNORMAL LOW (ref 12.0–15.0)
Immature Granulocytes: 1 %
Lymphocytes Relative: 3 %
Lymphs Abs: 0.2 10*3/uL — ABNORMAL LOW (ref 0.7–4.0)
MCH: 36.3 pg — ABNORMAL HIGH (ref 26.0–34.0)
MCHC: 33.9 g/dL (ref 30.0–36.0)
MCV: 107.1 fL — ABNORMAL HIGH (ref 80.0–100.0)
Monocytes Absolute: 0.7 10*3/uL (ref 0.1–1.0)
Monocytes Relative: 8 %
Neutro Abs: 6.8 10*3/uL (ref 1.7–7.7)
Neutrophils Relative %: 87 %
Platelet Count: 182 10*3/uL (ref 150–400)
RBC: 2.4 MIL/uL — ABNORMAL LOW (ref 3.87–5.11)
RDW: 20.1 % — ABNORMAL HIGH (ref 11.5–15.5)
WBC Count: 7.8 10*3/uL (ref 4.0–10.5)
nRBC: 0.4 % — ABNORMAL HIGH (ref 0.0–0.2)

## 2024-01-15 LAB — TSH: TSH: 1.03 u[IU]/mL (ref 0.350–4.500)

## 2024-01-15 LAB — RAD ONC ARIA SESSION SUMMARY
Course Elapsed Days: 37
Plan Fractions Treated to Date: 3
Plan Prescribed Dose Per Fraction: 1.8 Gy
Plan Total Fractions Prescribed: 3
Plan Total Prescribed Dose: 5.4 Gy
Reference Point Dosage Given to Date: 5.4 Gy
Reference Point Session Dosage Given: 1.8 Gy
Session Number: 28

## 2024-01-15 MED ORDER — SODIUM CHLORIDE 0.9 % IV SOLN: INTRAVENOUS | Status: DC

## 2024-01-15 MED ORDER — SODIUM CHLORIDE 0.9% FLUSH
10.0000 mL | Freq: Once | INTRAVENOUS | Status: AC
  Administered 2024-01-15: 10 mL

## 2024-01-15 MED ORDER — HEPARIN SOD (PORK) LOCK FLUSH 100 UNIT/ML IV SOLN: 500.0000 [IU] | Freq: Once | INTRAVENOUS | Status: DC | PRN

## 2024-01-15 MED ORDER — SODIUM CHLORIDE 0.9% FLUSH: 10.0000 mL | INTRAVENOUS | Status: DC | PRN

## 2024-01-15 MED ORDER — DURVALUMAB 500 MG/10ML IV SOLN
1500.0000 mg | Freq: Once | INTRAVENOUS | Status: AC
  Administered 2024-01-15: 1500 mg via INTRAVENOUS
  Filled 2024-01-15: qty 30

## 2024-01-15 NOTE — Progress Notes (Signed)
 Patient Care Team: Ransom Byers, MD as PCP - General (Family Medicine) Avanell Leigh, MD as PCP - Cardiology (Cardiology) Sonja Morgan, MD as Consulting Physician (Oncology)  Clinic Day:  01/15/2024  Referring physician: Sonja Dixon, MD  ASSESSMENT & PLAN:   Assessment & Plan: Gallbladder cancer (HCC) cT4N0M0, stage III -Patient had 1 episode of abdominal pain in October 2023, and was found to have a large gallbladder stone. -She underwent laparoscopic attempted cholecystectomy but was not able to be resected due to adhesions to duodenum and colon. -She developed jaundice in June 2024, status post ERCP and stent placement in CBD.  CT and MRI scan showed a liver mass adjacent to the gallbladder fossae, and a biopsy confirmed adenocarcinoma.  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  Unfortunately this was felt to be not resectable by Dr. Leighton Punches. -I reviewed her CT chest from April 22, 2023, which showed no evidence of distant metastasis. -I recommended first-line cisplatin , gemcitabine   every 14 days and durvalumab  every 28 days.  If she responded well, will stop chemo after 4 to 6 months of treatment, and changed to maintenance therapy. She started on 05/03/23 every 2 weeks and has been tolerating well.  We have also discussed option of consolidation radiation at some point. -restaging CT 07/08/2023 showed partial response in the primary site tumor with direct invasion to liver.  No new disease or question.   -case reviewed in GI tumor board, consolidation RT is also an option. She started RT and Xeloda  on 12/09/2023 -plan to continue durvalumab   -01/15/2024 - she finished consolidation RT today. She will complete oral capecitabine  this evening. She will proceed with IV durvalumab  today.  --scheduled for CT abdomen and pelvis on 01/30/2024.  --labs/flush, follow up with Dr. Maryalice Smaller, and  next treatment with immunotherapy, durvalumab  as scheduled.    Gallbladder cancer  - she completed SBRT today. Will take final dose capecitabine  tonight.  - Discontinue capecitabine  after radiation therapy completion. - immunotherapy with durvalumab  to be continued every 28 days.  - CT scan scheduled for 01/30/2024.  - Plan PET scan and circulating tumor DNA test if CT scan is negative. - Monitor cancer antigen levels monthly.  Heartburn Patient stopped pantoprazole  during radiation treatment.   Was taking Pepcid  daily as replacement.  Was not as effective.  Was also taking Tums. May restart pantoprazole  daily.  Okay to continue using Pepcid  20 mg up to twice daily if needed for breakthrough heartburn symptoms.  May also use Tums if needed.  Anemia Blood counts are mildly low but stable.  Will continue to monitor with each visit.  Treat with IV iron  as indicated.  Plan: Patient seen during infusion today. Labs reviewed.  - Mild and stable anemia. -Chronic kidney disease.  BUN and creatinine slightly improved today.  Continue regular visits with nephrology. Final radiation treatment today.  Final dose oral capecitabine  today. CT abdomen pelvis scheduled for 01/30/2024. PET scan and circulating tumor DNA testing if CT scan negative. Proceed with immunotherapy durvalumab  today. Continue immunotherapy durvalumab  every 28 days.   The patient understands the plans discussed today and is in agreement with them.  She knows to contact our office if she develops concerns prior to her next appointment.  I provided 20 minutes of face-to-face time during this encounter and > 50% was spent counseling as documented under my assessment and plan.    [name]  E Virtie Bungert, NP  [facility] - A DEPT OF [facility] [address] [place] [address] Dept: [phone] Dept Fax: [phone]   No orders of the defined types were placed in  this encounter.     CHIEF COMPLAINT:  CC: gallbladder cancer   Current Treatment:  concurrent chemoradiation with xeloda . Maintenance durvalumab .   INTERVAL HISTORY:  [name] is here today for repeat clinical assessment. She was last seen by Dr. Maryalice Smaller 12/31/2023.  She does report decreased appetite recently.  She believes this is related to Xeloda .  Has had increased heartburn since radiation started.  She states pantoprazole  was stopped during her radiation.  Okay to restart since radiation has completed today.  She may continue to use Pepcid  and/or Tums as needed.  She denies chest pain, chest pressure, or shortness of breath. She denies headaches or visual disturbances. She denies abdominal pain, nausea, vomiting, or changes in bowel or bladder habits.  She denies fevers or chills. She denies pain. Her weight has been stable.  I have reviewed the past medical history, past surgical history, social history and family history with the patient and they are unchanged from previous note.  ALLERGIES:  is allergic to versed  [midazolam ], augmentin [amoxicillin-pot clavulanate], bactrim  [sulfamethoxazole -trimethoprim ], demerol [meperidine], meperidine hcl, scopolamine, and tramadol .  MEDICATIONS:  Current Outpatient Medications  Medication Sig Dispense Refill   aspirin  EC 81 MG tablet Take 81 mg by mouth at bedtime. Swallow whole.     atorvastatin  (LIPITOR ) 80 MG tablet Take 1 tablet (80 mg total) by mouth daily. 90 tablet 3   brimonidine  (ALPHAGAN ) 0.2 % ophthalmic solution Place 1 drop into the right eye 2 (two) times daily.     capecitabine  (XELODA ) 500 MG tablet Take 2 tablets (1000 mg total) by mouth 2 (two) times daily. Take within 30 minutes after meals. Take only on days of radiation, Monday to Friday. 60 tablet 1   empagliflozin  (JARDIANCE ) 25 MG TABS tablet Take 1 tablet (25 mg total) by mouth daily before breakfast. 90 tablet 3   hydrochlorothiazide  (HYDRODIURIL ) 12.5 MG tablet Take 1 tablet  by mouth once daily 90 tablet 3   Insulin  Lispro Prot & Lispro (HUMALOG  MIX 75/25 KWIKPEN) (75-25) 100 UNIT/ML Kwikpen INJECT 6 UNITS SUBCUTANEOUSLY ONCE DAILY WITH BREAKFAST 15 mL 4   Insulin  Pen Needle (ULTRA-THIN II MINI PEN NEEDLE) 31G X 5 MM MISC Use as directed to check blood glucose 50 each 2   Lancets (ONETOUCH DELICA PLUS LANCET33G) MISC Use to check blood sugars twice daily. (Patient taking differently: Use to check blood sugars  daily.) 100 each 5   MAGNESIUM -OXIDE 400 (240 Mg) MG tablet Take 1 tablet by mouth twice daily 60 tablet 0   metFORMIN  (GLUCOPHAGE ) 1000 MG tablet Take 1 tablet (1,000 mg total) by mouth 2 (two) times daily. 180 tablet 2   metoprolol  tartrate (LOPRESSOR ) 50 MG tablet Take 1 tablet (50 mg total) by mouth 2 (two) times daily. 90 tablet 3   Multiple Vitamins-Minerals (MULTI FOR HER PO) Take 1 tablet by mouth daily.     mupirocin  ointment (BACTROBAN ) 2 % Apply 1 Application topically 2 (two) times daily. (Patient taking differently: Apply 1 Application topically 2 (two) times daily as needed (wound care for toes).) 30 g 2   ONETOUCH VERIO test strip USE TO CHECK BLOOD SUGARS ONCE DAILY 100 each 2   Propylene Glycol (SYSTANE BALANCE) 0.6 % SOLN Place 1 drop  into the left eye 3 (three) times daily as needed (dry/irritated eyes).     repaglinide  (PRANDIN ) 1 MG tablet Take 1 tablet (1 mg total) by mouth 2 (two) times daily before a meal. Take 1 tab by breakfast and 1 tab before dinner 180 tablet 4   rivaroxaban  (XARELTO ) 2.5 MG TABS tablet Take 1 tablet by mouth twice daily 60 tablet 11   telmisartan  (MICARDIS ) 80 MG tablet Take 1 tablet (80 mg total) by mouth daily. 90 tablet 3   No current facility-administered medications for this visit.   Facility-Administered Medications Ordered in Other Visits  Medication Dose Route Frequency Provider Last Rate Last Admin   0.9 %  sodium chloride  infusion   Intravenous Continuous Sonja [place], MD 10 mL/hr at 01/15/24 1415 New Bag at  01/15/24 1415   durvalumab  (IMFINZI ) 1,500 mg in sodium chloride  0.9 % 100 mL chemo infusion  1,500 mg Intravenous Once Sonja Jewell, MD 130 mL/hr at 01/15/24 1459 1,500 mg at 01/15/24 1459   heparin  lock flush 100 unit/mL  500 Units Intracatheter Once PRN Sonja [place], MD       sodium chloride  flush (NS) 0.9 % injection 10 mL  10 mL Intracatheter PRN Sonja [place], MD        HISTORY OF PRESENT ILLNESS:   Oncology History Overview Note   Cancer Staging  Gallbladder cancer [facility]) Staging form: Distal Bile Duct, AJCC 8th Edition - Clinical stage from 04/09/2023: Stage IIIB (cT4, cN0, cM0) - Signed by Sonja [place], MD on 04/18/2023 Total positive nodes: 0 Histologic grade (G): G2 Histologic grading system: 3 grade system     Gallbladder cancer (HCC)  04/04/2023 Imaging    IMPRESSION: 1. Ill-defined hypodensity in the region of the gallbladder fossa appears similar to the prior study given lack of contrast. This would be better evaluated with dedicated MRI. 2. Nonobstructing right renal calculus. 3. Large amount of stool throughout the colon. 4. Stable indeterminate lucent areas in the vertebral bodies of the lumbar spine. 5. Severe atherosclerotic disease.     04/09/2023 Pathology Results     FINAL MICROSCOPIC DIAGNOSIS:   A. LIVER, ADJACENT TO GALLBLADDER FOSSA, BIOPSY:  - Moderately differentiated adenocarcinoma    04/09/2023 Cancer Staging   Staging form: Distal Bile Duct, AJCC 8th Edition - Clinical stage from 04/09/2023: Stage IIIB (cT4, cN0, cM0) - Signed by Sonja , MD on 04/18/2023 Total positive nodes: 0 Histologic grade (G): G2 Histologic grading system: 3 grade system   04/18/2023 Initial Diagnosis   Cholangiocarcinoma of liver (HCC)   05/03/2023 - 11/08/2023 Chemotherapy   Patient is on Treatment Plan : BILIARY TRACT Cisplatin  + Gemcitabine  D1,15 + Durvalumab  (1500) D1 q28d / Durvalumab  (1500) q28d     07/08/2023 Imaging   CT Chest abdomen and pelvis with contrast   IMPRESSION: CT CHEST IMPRESSION  1.  No acute process or evidence of metastatic disease in the chest. 2. Left Port-A-Cath tip in azygous vein. This is being adjusted by interventional radiology later today. 3. Ongoing stability of tiny bilateral pulmonary nodules, favored to be benign. 4.  Aortic Atherosclerosis (ICD10-I70.0).  CT ABDOMEN AND PELVIS IMPRESSION  1. The segment 5 liver lesion is decreased in size compared to the MRI of 04/05/2023. 2. Placement of a biliary stent with improvement, near complete resolution of duct dilatation. 3. No abdominopelvic adenopathy. 4. Proximal gastric underdistention. Apparent wall thickening could be secondary. Correlate with symptoms of gastritis 5. Subtle findings within the superior spleen for which small volume interval  splenic infarct cannot be excluded. Correlate with left upper quadrant symptoms. 6.  Possible constipation.     09/27/2023 Imaging   CT chest abdomen and pelvis with contrast  IMPRESSION: CHEST:  1. Stable small pulmonary nodules. 2. No evidence of thoracic metastasis.  PELVIS:  1. Stable hepatic metastasis. 2. No new hepatic lesions. 3. No evidence of metastatic adenopathy in the abdomen pelvis. 4. Intrahepatic and extrahepatic biliary stent in place. No biliary duct dilatation. 5.  Aortic Atherosclerosis (ICD10-I70.0).   11/22/2023 -  Chemotherapy   Patient is on Treatment Plan : LUNG NSCLC Durvalumab  (1500) q28d         REVIEW OF SYSTEMS:   Constitutional: Denies fevers, chills or abnormal weight loss. Decreased appetite  Eyes: Denies blurriness of vision Ears, nose, mouth, throat, and face: Denies mucositis or sore throat Respiratory: Denies cough, dyspnea or wheezes Cardiovascular: Denies palpitation, chest discomfort or lower extremity swelling Gastrointestinal:  Denies nausea or change in bowel habits. Increased heartburn since radiation started. Has not been taking pantoprazole  Skin: Denies abnormal skin  rashes Lymphatics: Denies new lymphadenopathy or easy bruising Neurological:Denies numbness, tingling or new weaknesses Behavioral/Psych: Mood is stable, no new changes  All other systems were reviewed with the patient and are negative.   VITALS:   Today's Vitals   01/15/24 1519  BP: (!) 145/52  Pulse: 77  Resp: 18  Temp: 97.8 F (36.6 C)  SpO2: 100%  Weight: 131 lb (59.4 kg)   Body mass index is 21.47 kg/m.   Wt Readings from Last 3 Encounters:  01/15/24 131 lb (59.4 kg)  12/31/23 131 lb (59.4 kg)  12/19/23 133 lb 6.4 oz (60.5 kg)    Body mass index is 21.47 kg/m.  Performance status (ECOG): 1 - Symptomatic but completely ambulatory  PHYSICAL EXAM:   GENERAL:alert, no distress and comfortable SKIN: skin color, texture, turgor are normal, no rashes or significant lesions EYES: normal, Conjunctiva are pink and non-injected, sclera clear OROPHARYNX:no exudate, no erythema and lips, buccal mucosa, and tongue normal  NECK: supple, thyroid  normal size, non-tender, without nodularity LYMPH:  no palpable lymphadenopathy in the cervical, axillary or inguinal LUNGS: clear to auscultation and percussion with normal breathing effort HEART: regular rate & rhythm and no murmurs and no lower extremity edema ABDOMEN:abdomen soft, non-tender and normal bowel sounds Musculoskeletal:no cyanosis of digits and no clubbing  NEURO: alert & oriented x 3 with fluent speech, no focal motor/sensory deficits  LABORATORY DATA:  I have reviewed the data as listed    Component Value Date/Time   NA 136 01/15/2024 1225   NA 142 07/29/2020 0942   K 4.5 01/15/2024 1225   CL 106 01/15/2024 1225   CO2 25 01/15/2024 1225   GLUCOSE 238 (H) 01/15/2024 1225   BUN 65 (H) 01/15/2024 1225   BUN 17 07/29/2020 0942   CREATININE 1.12 (H) 01/15/2024 1225   CREATININE 1.24 (H) 09/23/2023 0828   CALCIUM  10.3 01/15/2024 1225   PROT 6.1 (L) 01/15/2024 1225   PROT 6.3 07/28/2020 0945   ALBUMIN  3.4 (L)  01/15/2024 1225   ALBUMIN  4.2 07/28/2020 0945   AST 22 01/15/2024 1225   ALT 12 01/15/2024 1225   ALKPHOS 97 01/15/2024 1225   BILITOT 0.5 01/15/2024 1225   GFRNONAA 49 (L) 01/15/2024 1225   GFRAA 75 07/29/2020 0942     Lab Results  Component Value Date   WBC 7.8 01/15/2024   NEUTROABS 6.8 01/15/2024   HGB 8.7 (L) 01/15/2024   HCT 25.7 (  L) 01/15/2024   MCV 107.1 (H) 01/15/2024   PLT 182 01/15/2024

## 2024-01-15 NOTE — Patient Instructions (Signed)
 [facility] - A DEPT OF [facility]  Discharge Instructions: Thank you for choosing [facility] to provide your oncology and hematology care.   If you have a lab appointment with the Cancer Center, please go directly to the Cancer Center and check in at the registration area.   Wear comfortable clothing and clothing appropriate for easy access to any Portacath or PICC line.   We strive to give you quality time with your provider. You may need to reschedule your appointment if you arrive late (15 or more minutes).  Arriving late affects you and other patients whose appointments are after yours.  Also, if you miss three or more appointments without notifying the office, you may be dismissed from the clinic at the provider's discretion.      For prescription refill requests, have your pharmacy contact our office and allow 72 hours for refills to be completed.    Today you received the following chemotherapy and/or immunotherapy agents: imfinzi   To help prevent nausea and vomiting after your treatment, we encourage you to take your nausea medication as directed.  BELOW ARE SYMPTOMS THAT SHOULD BE REPORTED IMMEDIATELY: *FEVER GREATER THAN 100.4 F (38 C) OR HIGHER *CHILLS OR SWEATING *NAUSEA AND VOMITING THAT IS NOT CONTROLLED WITH YOUR NAUSEA MEDICATION *UNUSUAL SHORTNESS OF BREATH *UNUSUAL BRUISING OR BLEEDING *URINARY PROBLEMS (pain or burning when urinating, or frequent urination) *BOWEL PROBLEMS (unusual diarrhea, constipation, pain near the anus) TENDERNESS IN MOUTH AND THROAT WITH OR WITHOUT PRESENCE OF ULCERS (sore throat, sores in mouth, or a toothache) UNUSUAL RASH, SWELLING OR PAIN  UNUSUAL VAGINAL DISCHARGE OR ITCHING   Items with * indicate a potential emergency and should be followed up as soon as possible or go to the Emergency Department if any problems should occur.  Please show the CHEMOTHERAPY ALERT CARD or IMMUNOTHERAPY ALERT  CARD at check-in to the Emergency Department and triage nurse.  Should you have questions after your visit or need to cancel or reschedule your appointment, please contact [facility] - A DEPT OF [facility]  Dept: [phone]  and follow the prompts.  Office hours are 8:00 a.m. to 4:30 p.m. Monday - Friday. Please note that voicemails left after 4:00 p.m. may not be returned until the following business day.  We are closed weekends and major holidays. You have access to a nurse at all times for urgent questions. Please call the main number to the clinic Dept: [phone] and follow the prompts.   For any non-urgent questions, you may also contact your provider using MyChart. We now offer e-Visits for anyone 65 and older to request care online for non-urgent symptoms. For details visit [email or web address].   Also download the MyChart app! Go to the app store, search "MyChart", open the app, select [facility], and log in with your MyChart username and password.

## 2024-01-15 NOTE — Assessment & Plan Note (Addendum)
 cT4N0M0, stage III -Patient had 1 episode of abdominal pain in October 2023, and was found to have a large gallbladder stone. -She underwent laparoscopic attempted cholecystectomy but was not able to be resected due to adhesions to duodenum and colon. -She developed jaundice in June 2024, status post ERCP and stent placement in CBD.  CT and MRI scan showed a liver mass adjacent to the gallbladder fossae, and a biopsy confirmed adenocarcinoma.  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  Unfortunately this was felt to be not resectable by Dr. Leighton Punches. -I reviewed her CT chest from April 22, 2023, which showed no evidence of distant metastasis. -I recommended first-line cisplatin , gemcitabine   every 14 days and durvalumab  every 28 days.  If she responded well, will stop chemo after 4 to 6 months of treatment, and changed to maintenance therapy. She started on 05/03/23 every 2 weeks and has been tolerating well.  We have also discussed option of consolidation radiation at some point. -restaging CT 07/08/2023 showed partial response in the primary site tumor with direct invasion to liver.  No new disease or question.   -case reviewed in GI tumor board, consolidation RT is also an option. She started RT and Xeloda  on 12/09/2023 -plan to continue durvalumab   -01/15/2024 - she finished consolidation RT today. She will complete oral capecitabine  this evening. She will proceed with IV durvalumab  today.  --scheduled for CT abdomen and pelvis on 01/30/2024.  --labs/flush, follow up with Dr. Maryalice Smaller, and next treatment with immunotherapy, durvalumab  as scheduled.

## 2024-01-16 LAB — CANCER ANTIGEN 19-9: CA 19-9: 567 U/mL — ABNORMAL HIGH (ref 0–35)

## 2024-01-16 LAB — T4: T4, Total: 15.4 ug/dL — ABNORMAL HIGH (ref 4.5–12.0)

## 2024-01-16 NOTE — Radiation Completion Notes (Addendum)
  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, duodenum and colon   Intent: Curative     ==========DELIVERED PLANS==========  First Treatment Date: 2023-12-09 Last Treatment Date: 2024-01-15   Plan Name: Abd Site: Abdomen Technique: IMRT Mode: Photon Dose Per Fraction: 1.8 Gy Prescribed Dose (Delivered / Prescribed): 45 Gy / 45 Gy Prescribed Fxs (Delivered / Prescribed): 25 / 25   Plan Name: Abd_Bst Site: Abdomen Technique: IMRT Mode: Photon Dose Per Fraction: 1.8 Gy Prescribed Dose (Delivered / Prescribed): 5.4 Gy / 5.4 Gy Prescribed Fxs (Delivered / Prescribed): 3 / 3     ==========ON TREATMENT VISIT DATES========== 2023-12-13, 2023-12-20, 2023-12-27, 2024-01-03, 2024-01-10, 2024-01-15    See weekly On Treatment Notes in Epic for details in the Media tab (listed as Progress notes on the On Treatment Visit Dates listed above). The patient tolerated radiation. She developed fatigue. She did note intermittent pain in her abdomen throughout the course of therapy.  The patient will receive a call in about one month from the radiation oncology department. She will continue follow up with Dr. Maryalice Smaller as well.      Shelvia Dick, PAC

## 2024-01-17 ENCOUNTER — Other Ambulatory Visit: Payer: Self-pay

## 2024-01-21 ENCOUNTER — Other Ambulatory Visit: Payer: Self-pay | Admitting: Hematology

## 2024-01-22 ENCOUNTER — Encounter: Payer: Self-pay | Admitting: Hematology

## 2024-01-24 DIAGNOSIS — H10013 Acute follicular conjunctivitis, bilateral: Secondary | ICD-10-CM | POA: Diagnosis not present

## 2024-01-28 ENCOUNTER — Ambulatory Visit: Payer: Medicare Other | Admitting: Podiatry

## 2024-01-29 ENCOUNTER — Other Ambulatory Visit: Payer: Self-pay

## 2024-01-30 ENCOUNTER — Ambulatory Visit (HOSPITAL_COMMUNITY)
Admission: RE | Admit: 2024-01-30 | Discharge: 2024-01-30 | Disposition: A | Discharge status: Home / Self Care | Source: Ambulatory Visit | Attending: Hematology | Admitting: Hematology

## 2024-01-30 DIAGNOSIS — C23 Malignant neoplasm of gallbladder: Secondary | ICD-10-CM | POA: Diagnosis not present

## 2024-01-30 MED ORDER — HEPARIN SOD (PORK) LOCK FLUSH 100 UNIT/ML IV SOLN
INTRAVENOUS | Status: AC
Start: 2024-01-30 — End: ?
  Filled 2024-01-30: qty 5

## 2024-01-30 MED ORDER — HEPARIN SOD (PORK) LOCK FLUSH 100 UNIT/ML IV SOLN
500.0000 [IU] | Freq: Once | INTRAVENOUS | Status: AC
  Administered 2024-01-30: 500 [IU] via INTRAVENOUS

## 2024-01-30 MED ORDER — IOHEXOL 300 MG/ML  SOLN
100.0000 mL | Freq: Once | INTRAMUSCULAR | Status: AC | PRN
  Administered 2024-01-30: 100 mL via INTRAVENOUS

## 2024-01-31 DIAGNOSIS — H10013 Acute follicular conjunctivitis, bilateral: Secondary | ICD-10-CM | POA: Diagnosis not present

## 2024-02-06 ENCOUNTER — Ambulatory Visit: Attending: Nurse Practitioner | Admitting: Nurse Practitioner

## 2024-02-06 VITALS — BP 126/56 | HR 84 | Ht 65.5 in | Wt 114.0 lb

## 2024-02-06 DIAGNOSIS — I6523 Occlusion and stenosis of bilateral carotid arteries: Secondary | ICD-10-CM | POA: Insufficient documentation

## 2024-02-06 DIAGNOSIS — E119 Type 2 diabetes mellitus without complications: Secondary | ICD-10-CM | POA: Diagnosis not present

## 2024-02-06 DIAGNOSIS — I10 Essential (primary) hypertension: Secondary | ICD-10-CM | POA: Insufficient documentation

## 2024-02-06 DIAGNOSIS — I2510 Atherosclerotic heart disease of native coronary artery without angina pectoris: Secondary | ICD-10-CM | POA: Diagnosis not present

## 2024-02-06 DIAGNOSIS — Z794 Long term (current) use of insulin: Secondary | ICD-10-CM | POA: Diagnosis not present

## 2024-02-06 DIAGNOSIS — I739 Peripheral vascular disease, unspecified: Secondary | ICD-10-CM | POA: Diagnosis not present

## 2024-02-06 DIAGNOSIS — E785 Hyperlipidemia, unspecified: Secondary | ICD-10-CM | POA: Insufficient documentation

## 2024-02-06 MED ORDER — ROSUVASTATIN CALCIUM 40 MG PO TABS: 40.0000 mg | ORAL_TABLET | Freq: Every day | ORAL | 3 refills | Status: DC

## 2024-02-06 NOTE — Patient Instructions (Addendum)
 Medication Instructions:  Stop Atorvastatin  80 mg  Start Rosuvastatin 40 mg daily  *If you need a refill on your cardiac medications before your next appointment, please call your pharmacy*  Lab Work: Fasting lipid panel & LFTs  Testing/Procedures: NONE ordered at this time of appointment    Follow-Up: At [facility], you and your health needs are our priority.  As part of our continuing mission to provide you with exceptional heart care, our providers are all part of one team.  This team includes your primary Cardiologist (physician) and Advanced Practice Providers or APPs (Physician Assistants and Nurse Practitioners) who all work together to provide you with the care you need, when you need it.  Your next appointment:   6 month(s)  Provider:   Lauro Portal, MD    We recommend signing up for the patient portal called "MyChart".  Sign up information is provided on this After Visit Summary.  MyChart is used to connect with patients for Virtual Visits (Telemedicine).  Patients are able to view lab/test results, encounter notes, upcoming appointments, etc.  Non-urgent messages can be sent to your provider as well.   To learn more about what you can do with MyChart, go to [email or web address].   Other Instructions

## 2024-02-06 NOTE — Progress Notes (Signed)
 Office Visit    Patient Name: [name] Date of Encounter: 02/06/2024  Primary Care Provider:  Ransom Byers, MD Primary Cardiologist:  Lauro Portal, MD  Chief Complaint    83 year old female with a history of CAD s/p CABG x 5 in 2021, PAD with critical lower limb ischemia s/p bilateral TMA's, stenting of right extrailiac artery, and PTCA to right popliteal and peroneal arteries, carotid artery disease, hypertension, and hyperlipidemia who presents for follow-up related to CAD.   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 There is a small calcified mass in the L:A near the LA appendage. No valvular masses seen with associated mitral annular calcification. LA Volume/ BSA27.4 ml/m2 No AS. Right ventricular systolic pressure is elevated at .   Jaundice    Myocardial infarction [facility])    June 2021   Neuromuscular disorder (HCC)    neuropathy in bilateral feet   Peripheral vascular disease (HCC)    Pneumonia    not hosp.    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  PTCAs with drug coated balloon Lt ant tibial artery and Lt popliteal artery  11/26/2019   Past Surgical History:  Procedure Laterality Date   ABDOMINAL AORTAGRAM  11/25/2019   ABDOMINAL AORTOGRAM W/LOWER EXTREMITY    ABDOMINAL AORTOGRAM N/A 06/15/2019   Procedure: ABDOMINAL AORTOGRAM;  Surgeon: Avanell Leigh, MD;  [facility];  Service: Cardiovascular;  Laterality: N/A;   ABDOMINAL AORTOGRAM W/LOWER EXTREMITY N/A 11/25/2019   Procedure: ABDOMINAL AORTOGRAM W/LOWER EXTREMITY;  Surgeon: Wenona Hamilton, MD;  [facility];  Service: Cardiovascular;  Laterality: N/A;  Lt leg   ABDOMINAL AORTOGRAM W/LOWER EXTREMITY N/A 08/01/2020   Procedure: ABDOMINAL AORTOGRAM W/LOWER EXTREMITY;  Surgeon: Avanell Leigh, MD;  [facility];  Service: Cardiovascular;  Laterality: N/A;   ABDOMINAL AORTOGRAM W/LOWER EXTREMITY N/A 04/28/2021   Procedure: ABDOMINAL AORTOGRAM W/LOWER EXTREMITY;  Surgeon: Richrd Char, MD;  [facility];  Service: Cardiovascular;  Laterality: N/A;   ABDOMINAL AORTOGRAM W/LOWER EXTREMITY N/A 10/13/2021   Procedure: ABDOMINAL AORTOGRAM W/LOWER EXTREMITY;  Surgeon: Carlene Che, MD;  [facility];  Service: Cardiovascular;  Laterality: N/A;   ABDOMINAL AORTOGRAM W/LOWER EXTREMITY N/A 11/09/2022   Procedure: ABDOMINAL AORTOGRAM W/LOWER EXTREMITY;  Surgeon: Carlene Che, MD;  [facility];  Service: Cardiovascular;  Laterality: N/A;   ABDOMINAL AORTOGRAM W/LOWER EXTREMITY N/A 11/16/2022   Procedure: ABDOMINAL AORTOGRAM W/LOWER EXTREMITY;  Surgeon: Carlene Che, MD;  [facility];  Service: Cardiovascular;  Laterality: N/A;   AMPUTATION TOE Left 05/03/2021   Procedure: AMPUTATION OF THIRD LEFT  TOE;  Surgeon: Carlene Che, MD;  [facility];  Service:  Vascular;  Laterality: Left;   APPENDECTOMY     APPLICATION OF WOUND VAC Right 07/21/2019   Procedure: Application Of Prevena Wound Vac Right Groin;  Surgeon: Margherita Shell, MD;  Location: [facility] OR;  Service: Vascular;  Laterality: Right;   BILIARY BRUSHING  04/07/2023   Procedure: BILIARY BRUSHING;  Surgeon: Ozell Blunt, MD;  Location: [facility] ENDOSCOPY;  Service: Gastroenterology;;   BILIARY STENT PLACEMENT   04/07/2023   Procedure: BILIARY STENT PLACEMENT;  Surgeon: Ozell Blunt, MD;  Location: [facility] ENDOSCOPY;  Service: Gastroenterology;;   CARPAL TUNNEL RELEASE Left    CARPAL TUNNEL RELEASE Right    CESAREAN SECTION     x 2   CHOLECYSTECTOMY N/A 10/12/2022   Procedure: LAPAROSCOPIC PARTIAL FENESTRATING CHOLECYSTECTOMY;  Surgeon: Lujean Sake, MD;  [facility];  Service: General;  Laterality: N/A;   COLONOSCOPY     CORONARY ARTERY BYPASS GRAFT N/A 03/24/2020   Procedure: CORONARY ARTERY BYPASS GRAFTING (CABG) using LIMA to LAD (m); RIMA to RAMUS; Endoscopic Right Greater Saphenous Vein: SVG to Diag1; SVG to PLB (right); and SVG to PL (left).;  Surgeon: Rudine Cos, MD;  [facility];  Service: Open Heart Surgery;  Laterality: N/A;  BILATERAL IMA   ENDARTERECTOMY FEMORAL Right 07/21/2019   Procedure: RIGHT ENDARTERECTOMY FEMORAL WITH PATCH ANGIOPLASTY;  Surgeon: Margherita Shell, MD;  [facility];  Service: Vascular;  Laterality: Right;   ENDARTERECTOMY FEMORAL Right 08/16/2020   Procedure: RIGHT FEMORAL ENDARTERECTOMY;  Surgeon: Adine Hoof, MD;  Location: [facility] OR;  Service: Vascular;  Laterality: Right;   ENDOVEIN HARVEST OF GREATER SAPHENOUS VEIN Right 03/24/2020   Procedure: ENDOVEIN HARVEST OF GREATER SAPHENOUS VEIN;  Surgeon: Rudine Cos, MD;  [facility];  Service: Open Heart Surgery;  Laterality: Right;   ERCP N/A 04/07/2023   Procedure: ENDOSCOPIC RETROGRADE CHOLANGIOPANCREATOGRAPHY (ERCP);  Surgeon: Ozell Blunt, MD;  Location: [facility] ENDOSCOPY;  Service: Gastroenterology;  Laterality: N/A;   EYE SURGERY     cataract removal bilaterally   FEMORAL-POPLITEAL BYPASS GRAFT Right 08/16/2020   Procedure: BYPASS GRAFT FEMORAL-POPLITEAL ARTERY;  Surgeon: Adine Hoof, MD;  Location: [facility] OR;  Service: Vascular;  Laterality: Right;   FEMORAL-POPLITEAL BYPASS GRAFT Left 05/03/2021   Procedure: LEFT FEMORAL TO BELOW KNEE POPLITEAL ARTERY BYPASS GRAFTING  WITH 6MMX80 PTFE GRAFT;  Surgeon: Carlene Che, MD;  [facility];  Service: Vascular;  Laterality: Left;   LEFT HEART CATH AND CORONARY ANGIOGRAPHY N/A 03/22/2020   Procedure: LEFT HEART CATH AND CORONARY ANGIOGRAPHY;  Surgeon: Swazil[place], Peter M, MD;  Location: [facility] INVASIVE CV LAB;  Service: Cardiovascular;  Laterality: N/A;   LOWER EXTREMITY ANGIOGRAPHY Bilateral 06/15/2019   Procedure: Lower Extremity Angiography;  Surgeon: Avanell Leigh, MD;  Location: [facility] INVASIVE CV LAB;  Service: Cardiovascular;  Laterality: Bilateral;   LOWER EXTREMITY ANGIOGRAPHY Right 08/03/2019   Procedure: LOWER EXTREMITY ANGIOGRAPHY;  Surgeon: Avanell Leigh, MD;  [facility];  Service: Cardiovascular;  Laterality: Right;   LOWER EXTREMITY ANGIOGRAPHY N/A 09/07/2021   Procedure: LOWER EXTREMITY ANGIOGRAPHY;  Surgeon: Young Hensen, MD;  [facility];  Service: Cardiovascular;  Laterality: N/A;   PATCH ANGIOPLASTY Right 07/21/2019   Procedure: Patch Angioplasty Right Femoral Artery;  Surgeon: Margherita Shell, MD;  Location: [facility] OR;  Service: Vascular;  Laterality: Right;   PERIPHERAL INTRAVASCULAR LITHOTRIPSY  11/25/2019   Procedure: INTRAVASCULAR LITHOTRIPSY;  Surgeon: Wenona Hamilton, MD;  [facility];  Service: Cardiovascular;;  LT. SFA   PERIPHERAL VASCULAR ATHERECTOMY  08/03/2019   Procedure: PERIPHERAL VASCULAR ATHERECTOMY;  Surgeon: Avanell Leigh, MD;  Location: [facility] INVASIVE CV LAB;  Service: Cardiovascular;;  right SFA, right TP trunk   PERIPHERAL VASCULAR ATHERECTOMY  11/25/2019   Procedure: PERIPHERAL VASCULAR ATHERECTOMY;  Surgeon: Wenona Hamilton, MD;  [facility];  Service: Cardiovascular;;  Lt.  POPLITEAL and AT   PERIPHERAL VASCULAR BALLOON ANGIOPLASTY Left 06/15/2019   Procedure: PERIPHERAL VASCULAR BALLOON ANGIOPLASTY;  Surgeon: Avanell Leigh, MD;  [facility];  Service: Cardiovascular;   Laterality: Left;  SFA UNSUCCESSFUL UNABLE TO CROSS LESION   PERIPHERAL VASCULAR BALLOON ANGIOPLASTY  08/03/2019   Procedure: PERIPHERAL VASCULAR BALLOON ANGIOPLASTY;  Surgeon: Avanell Leigh, MD;  [facility];  Service: Cardiovascular;;  right SFA, Right TP trunk   PERIPHERAL VASCULAR BALLOON ANGIOPLASTY  09/07/2021   Procedure: PERIPHERAL VASCULAR BALLOON ANGIOPLASTY;  Surgeon: Young Hensen, MD;  [facility];  Service: Cardiovascular;;   PERIPHERAL VASCULAR BALLOON ANGIOPLASTY Right 10/13/2021   Procedure: PERIPHERAL VASCULAR BALLOON ANGIOPLASTY;  Surgeon: Carlene Che, MD;  [facility];  Service: Cardiovascular;  Laterality: Right;   PERIPHERAL VASCULAR BALLOON ANGIOPLASTY  11/09/2022   Procedure: PERIPHERAL VASCULAR BALLOON ANGIOPLASTY;  Surgeon: Carlene Che, MD;  [facility];  Service: Cardiovascular;;  fem-pop bypass and AT   PORTACATH PLACEMENT Left 04/29/2023   Procedure: INSERTION PORT-A-CATH;  Surgeon: Lujean Sake, MD;  Location: [facility] ORS;  Service: General;  Laterality: Left;   PORTACATH PLACEMENT N/A 07/12/2023   Procedure: REVISION PORT-A-CATH;  Surgeon: Lujean Sake, MD;  Location: [facility] OR;  Service: General;  Laterality: N/A;   REMOVAL OF STONES  04/07/2023   Procedure: REMOVAL OF STONES;  Surgeon: Ozell Blunt, MD;  Location: [facility] ENDOSCOPY;  Service: Gastroenterology;;   [address]  04/07/2023   Procedure: [address];  Surgeon: Ozell Blunt, MD;  Location: [facility] ENDOSCOPY;  Service: Gastroenterology;;   TEE WITHOUT CARDIOVERSION N/A 03/24/2020   Procedure: TRANSESOPHAGEAL ECHOCARDIOGRAM (TEE);  Surgeon: Rudine Cos, MD;  Location: [facility] OR;  Service: Open Heart Surgery;  Laterality: N/A;   TONSILLECTOMY     and adenoidectomy   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/2019   Procedure: TRANSMETATARSAL AMPUTATION;  Surgeon: Charity Conch, DPM;  [facility];  Service: Podiatry;   Laterality: Right;   TRANSMETATARSAL AMPUTATION Left 05/11/2021   Procedure: LEFT TRANSMETATARSAL AMPUTATION;  Surgeon: Adine Hoof, MD;  Location: [facility] OR;  Service: Vascular;  Laterality: Left;   TUBAL LIGATION      Allergies  Allergies  Allergen Reactions   Versed  [Midazolam ] Anxiety    Frantic, out of my mind, agitated    Augmentin [Amoxicillin-Pot Clavulanate] Other (See Comments)    Elevates potassium level   Bactrim  [Sulfamethoxazole -Trimethoprim ] Other (See Comments)    ^ K+( elevated)    Demerol [Meperidine]     Delusional    Meperidine Hcl     Other reaction(s): delusional   Scopolamine     Delusional   Patch *   Tramadol  Other (See Comments)    Keeps awake     Labs/Other Studies Reviewed    The following studies were reviewed today:  Cardiac Studies & Procedures   ______________________________________________________________________________________________ CARDIAC CATHETERIZATION  CARDIAC CATHETERIZATION 03/22/2020  Conclusion  Ost LM to Mid LM lesion is 45% stenosed.  Prox LAD to Mid LAD lesion is 90% stenosed.  Mid  LAD lesion is 75% stenosed.  Dist LAD lesion is 70% stenosed.  Ost Cx to Prox Cx lesion is 95% stenosed.  Prox Cx to Mid Cx lesion is 99% stenosed.  2nd Mrg lesion is 85% stenosed.  1st Mrg lesion is 95% stenosed.  Ost RCA lesion is 50% stenosed.  Mid RCA to Dist RCA lesion is 90% stenosed.  RPDA lesion is 70% stenosed.  The left ventricular systolic function is normal.  LV end diastolic pressure is mildly elevated.  The left ventricular ejection fraction is 55-65% by visual estimate.  1. Severe 3 vessel obstructive CAD. 2. Normal LV function 3. 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 Recommend consideration for CABG. Will hold Plavix . Resume IV heparin  post cath.  Findings Coronary Findings Diagnostic  Dominance: Right  Left Main Ost LM to Mid LM lesion is 45%  stenosed. The lesion is severely calcified.  Left Anterior Descending Prox LAD to Mid LAD lesion is 90% stenosed. The lesion is severely calcified. Mid LAD lesion is 75% stenosed. Dist LAD lesion is 70% stenosed.  Left Circumflex Ost Cx to Prox Cx lesion is 95% stenosed. Prox Cx to Mid Cx lesion is 99% stenosed. The lesion is thrombotic.  First Obtuse Marginal Branch 1st Mrg lesion is 95% stenosed.  Second Obtuse Marginal Branch 2nd Mrg lesion is 85% stenosed.  Right Coronary Artery Ost RCA lesion is 50% stenosed. The lesion is severely calcified. Mid RCA to Dist RCA lesion is 90% stenosed.  Right Posterior Descending Artery RPDA lesion is 70% stenosed.  Intervention  No interventions have been documented.     ECHOCARDIOGRAM  ECHOCARDIOGRAM COMPLETE 03/23/2020  Narrative ECHOCARDIOGRAM REPORT    Patient Name:   NICHA HEMANN Date of Exam: 03/23/2020 Medical Rec #:  [id number]     Height:       64.5 in Accession #:    [id number]    Weight:       143.1 lb Date of Birth:  [birth year]     BSA:          1.706 m Patient Age:    79 years      BP:           119/45 mmHg Patient Gender: F             HR:           65 bpm. Exam Location:  Inpatient  Procedure: 2D Echo, Cardiac Doppler and Color Doppler  Indications:    NSTEMI I21.4  History:        Patient has no prior history of Echocardiogram examinations. Signs/Symptoms:Murmur; Risk Factors:Hypertension, Diabetes and Dyslipidemia. Cancer. GERD.  Sonographer:    Jyl Or Dance Referring Phys: [id number] ZHYQM W ROSE  IMPRESSIONS   1. Left ventricular ejection fraction, by estimation, is 60 to 65%. The left ventricle has normal function. The left ventricle has no regional wall motion abnormalities. Left ventricular diastolic parameters are consistent with Grade I diastolic dysfunction (impaired relaxation). 2. Right ventricular systolic function is normal. The right ventricular size is normal. There is normal pulmonary  artery systolic pressure. 3. Left atrial size was mildly dilated. 4. The mitral valve is normal in structure. Trivial mitral valve regurgitation. No evidence of mitral stenosis. 5. The aortic valve is normal in structure. Aortic valve regurgitation is not visualized. No aortic stenosis is present. 6. The inferior vena cava is normal in size with greater than 50% respiratory variability, suggesting right atrial pressure of  3 mmHg.  FINDINGS Left Ventricle: Left ventricular ejection fraction, by estimation, is 60 to 65%. The left ventricle has normal function. The left ventricle has no regional wall motion abnormalities. The left ventricular internal cavity size was normal in size. There is no left ventricular hypertrophy. Left ventricular diastolic parameters are consistent with Grade I diastolic dysfunction (impaired relaxation). Indeterminate filling pressures.  Right Ventricle: The right ventricular size is normal. No increase in right ventricular wall thickness. Right ventricular systolic function is normal. There is normal pulmonary artery systolic pressure. The tricuspid regurgitant velocity is 2.10 m/s, and with an assumed right atrial pressure of 3 mmHg, the estimated right ventricular systolic pressure is 20.6 mmHg.  Left Atrium: Left atrial size was mildly dilated.  Right Atrium: Right atrial size was normal in size.  Pericardium: There is no evidence of pericardial effusion.  Mitral Valve: The mitral valve is normal in structure. There is mild thickening of the anterior mitral valve leaflet(s). Normal mobility of the mitral valve leaflets. Trivial mitral valve regurgitation. No evidence of mitral valve stenosis.  Tricuspid Valve: The tricuspid valve is normal in structure. Tricuspid valve regurgitation is trivial. No evidence of tricuspid stenosis.  Aortic Valve: The aortic valve is normal in structure.. There is mild thickening and mild calcification of the aortic valve. Aortic  valve regurgitation is not visualized. No aortic stenosis is present. There is mild thickening of the aortic valve. There is mild calcification of the aortic valve.  Pulmonic Valve: The pulmonic valve was normal in structure. Pulmonic valve regurgitation is trivial. No evidence of pulmonic stenosis.  Aorta: The aortic root is normal in size and structure.  Venous: The inferior vena cava is normal in size with greater than 50% respiratory variability, suggesting right atrial pressure of 3 mmHg.  IAS/Shunts: No atrial level shunt detected by color flow Doppler.   LEFT VENTRICLE PLAX 2D LVIDd:         4.12 cm  Diastology LVIDs:         2.98 cm  LV e' lateral:   6.42 cm/s LV PW:         0.95 cm  LV E/e' lateral: 12.1 LV IVS:        1.02 cm  LV e' medial:    5.87 cm/s LVOT diam:     2.10 cm  LV E/e' medial:  13.2 LV SV:         77 LV SV Index:   45 LVOT Area:     3.46 cm   RIGHT VENTRICLE             IVC RV Basal diam:  2.67 cm     IVC diam: 1.73 cm RV S prime:     11.40 cm/s TAPSE (M-mode): 1.7 cm  LEFT ATRIUM             Index       RIGHT ATRIUM           Index LA diam:        3.40 cm 1.99 cm/m  RA Area:     11.50 cm LA Vol (A2C):   51.7 ml 30.30 ml/m RA Volume:   24.10 ml  14.13 ml/m LA Vol (A4C):   31.0 ml 18.17 ml/m LA Biplane Vol: 42.7 ml 25.03 ml/m AORTIC VALVE LVOT Vmax:   81.30 cm/s LVOT Vmean:  52.100 cm/s LVOT VTI:    0.221 m  AORTA Ao Root diam: 2.90 cm Ao Asc diam:  3.50 cm  MITRAL VALVE               TRICUSPID VALVE MV Area (PHT): 2.09 cm    TR Peak grad:   17.6 mmHg MV Decel Time: 363 msec    TR Vmax:        210.00 cm/s MV E velocity: 77.60 cm/s MV A velocity: 95.10 cm/s  SHUNTS MV E/A ratio:  0.82        Systemic VTI:  0.22 m Systemic Diam: 2.10 cm  Maudine Sos MD Electronically signed by Maudine Sos MD Signature Date/Time: 03/23/2020/1:09:27 PM    Final   TEE  ECHO INTRAOPERATIVE TEE 03/24/2020  Narrative *INTRAOPERATIVE  TRANSESOPHAGEAL REPORT *    Patient Name:   Lelia Putnam  Date of Exam: 03/24/2020 Medical Rec #:  [id number]      Height:       64.5 in Accession #:    [id number]     Weight:       141.9 lb Date of Birth:  [birth year]      BSA:          1.70 m Patient Age:    79 years       BP:           173/67 mmHg Patient Gender: F              HR:           63 bpm. Exam Location:  Anesthesiology  Transesophogeal exam was perform intraoperatively during surgical procedure. Patient was closely monitored under general anesthesia during the entirety of examination.  Indications:     Coronary artery disease Sonographer:     Joannie Muff RDCS Performing Phys: [id number] [facility] Z ATKINS Diagnosing Phys: Kenyon Pears MD  Complications: No known complications during this procedure. POST-OP IMPRESSIONS Overall, there were no significant changes from pre-bypass. - Left Ventricle: The left ventricle is unchanged from pre-bypass. - Aorta: The aorta appears unchanged from pre-bypass. - Aortic Valve: The aortic valve appears unchanged from pre-bypass. - Tricuspid Valve: The tricuspid valve appears unchanged from pre-bypass.  PRE-OP FINDINGS Left Ventricle: The left ventricle has low normal systolic function, with an ejection fraction of 50-55%. The cavity size was mildly dilated. There is no increase in left ventricular wall thickness.  Right Ventricle: The right ventricle has normal systolic function. The cavity was mildly enlarged. There is no increase in right ventricular wall thickness.  Left Atrium: Left atrial size was normal in size. The left atrial appendage is well visualized and there is no evidence of thrombus present.  Right Atrium: Right atrial size was normal in size.  Interatrial Septum: No atrial level shunt detected by color flow Doppler.  Pericardium: There is no evidence of pericardial effusion.  Mitral Valve: Tip of Ant leaflet has calcifies edge. No thickening of the mitral valve leaflet.  Mild calcification of the mitral valve leaflet. Mitral valve regurgitation is mild by color flow Doppler. The MR jet is centrally-directed. There is No evidence of mitral stenosis.  Tricuspid Valve: The tricuspid valve was normal in structure. Tricuspid valve regurgitation was not visualized by color flow Doppler.  Aortic Valve: The aortic valve is tricuspid Aortic valve regurgitation was not visualized by color flow Doppler. There is no evidence of aortic valve stenosis.  Pulmonic Valve: The pulmonic valve was not assessed. Pulmonic valve regurgitation is not visualized by color flow Doppler.   Aorta: The aortic root, ascending aorta and aortic arch are normal in size and structure.  Kenyon Pears MD Electronically signed by Kenyon Pears MD Signature Date/Time: 03/24/2020/3:56:46 PM    Final        ______________________________________________________________________________________________     Recent Labs: 11/07/2023: Magnesium  1.8 01/15/2024: ALT 12; BUN 65; Creatinine 1.12; Hemoglobin 8.7; Platelet Count 182; Potassium 4.5; Sodium 136; TSH 1.030  Recent Lipid Panel    Component Value Date/Time   CHOL 117 09/23/2023 0828   CHOL 119 07/28/2020 0945   CHOL 164 04/06/2013 0909   TRIG 140 09/23/2023 0828   TRIG 187 (H) 04/06/2013 0909   HDL 42 (L) 09/23/2023 0828   HDL 44 07/28/2020 0945   HDL 40 04/06/2013 0909   CHOLHDL 2.8 09/23/2023 0828   VLDL 18 10/14/2021 0700   LDLCALC 53 09/23/2023 0828   LDLCALC 87 04/06/2013 0909    History of Present Illness    83 year old female with the above past medical history including CAD s/p CABG x 5 in 2021, 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   She was hospitalized in 02/2020 in the setting of a non-STEMI.  Cardiac catheterization revealed severe three-vessel disease  and she underwent CABG x 5 in 03/2020.  She has a history of extensive PAD with critical limb ischemia, nonhealing wounds s/p multiple interventions including bilateral TMA's, stenting of right extrailiac artery, and PTCA to right popliteal and peroneal arteries.  Follows with both Dr. Katheryne Pane and with vascular surgery (Dr. Edgardo Goodwill). ABIs in 01/2022 showed right ABIs somewhat decreased compared to prior study, left ABIs were essentially unchanged getting moderate bilateral lower extremity arterial disease.  Carotid Dopplers in 11/2021 revealed 40 to 59% R ICA stenosis, 1 to 39% LICA stenosis, and greater than 50% bilateral ECA stenosis. She was last seen in the office on 12/19/2021 and was stable from a cardiac standpoint.  She denied symptoms concerning for angina.  She was evaluated in the ED in October 2023 with epigastric pain.  Troponin was flat, chest x-ray and EKG were unremarkable.  CT of the abdomen and pelvis demonstrated acute diverticulitis.  She was treated with antibiotics.  She was diagnosed with gallbladder cancer, and has undergone treatment.  She was last seen in the office on 05/31/2023 by Dr. Katheryne Pane and was stable from a cardiac standpoint.  ABIs in 05/2023 revealed stable moderate bilateral lower extremity arterial disease.  Carotid ultrasound in 11/2023 revealed 40 to 59% R ICA stenosis, greater than 50% ECA stenosis, 1 to 39% LICA stenosis, greater than 50% plaque in the CCA occluded ECA.   She presents today for follow-up accompanied by her daughter.  Since her last visit she has been stable from a cardiac standpoint.  She denies symptoms concerning for angina, denies dyspnea, edema, PND, orthopnea, weight gain.  In fact, she continues to lose weight.  She has had nearly no appetite following chemo/radiation.  She reports that she has been through "a lot" with her cancer treatment.  She has follow-up planned with oncology, and is considering whether or not she will pursue immunotherapy going forward.     Home Medications    Current Outpatient Medications  Medication Sig Dispense Refill   aspirin  EC 81 MG tablet Take 81 mg by mouth at bedtime. Swallow whole.     atorvastatin  (LIPITOR ) 80 MG tablet Take 1 tablet (80 mg total) by mouth daily. 90 tablet 3   brimonidine  (ALPHAGAN ) 0.2 % ophthalmic solution Place 1  drop into the right eye 2 (two) times daily.     empagliflozin  (JARDIANCE ) 25 MG TABS tablet Take 1 tablet (25 mg total) by mouth daily before breakfast. 90 tablet 3   hydrochlorothiazide  (HYDRODIURIL ) 12.5 MG tablet Take 1 tablet by mouth once daily 90 tablet 3   Insulin  Lispro Prot & Lispro (HUMALOG  MIX 75/25 KWIKPEN) (75-25) 100 UNIT/ML Kwikpen INJECT 6 UNITS SUBCUTANEOUSLY ONCE DAILY WITH BREAKFAST 15 mL 4   Insulin  Pen Needle (ULTRA-THIN II MINI PEN NEEDLE) 31G X 5 MM MISC Use as directed to check blood glucose 50 each 2   Lancets (ONETOUCH DELICA PLUS LANCET33G) MISC Use to check blood sugars twice daily. (Patient taking differently: Use to check blood sugars  daily.) 100 each 5   MAGnesium -Oxide 400 (240 Mg) MG tablet Take 1 tablet by mouth twice daily 60 tablet 2   metFORMIN  (GLUCOPHAGE ) 1000 MG tablet Take 1 tablet (1,000 mg total) by mouth 2 (two) times daily. 180 tablet 2   metoprolol  tartrate (LOPRESSOR ) 50 MG tablet Take 1 tablet (50 mg total) by mouth 2 (two) times daily. 90 tablet 3   mupirocin  ointment (BACTROBAN ) 2 % Apply 1 Application topically 2 (two) times daily. (Patient taking differently: Apply 1 Application topically 2 (two) times daily as needed (wound care for toes).) 30 g 2   ONETOUCH VERIO test strip USE TO CHECK BLOOD SUGARS ONCE DAILY 100 each 2   Propylene Glycol (SYSTANE BALANCE) 0.6 % SOLN Place 1 drop into the left eye 3 (three) times daily as needed (dry/irritated eyes).     repaglinide  (PRANDIN ) 1 MG tablet Take 1 tablet (1 mg total) by mouth 2 (two) times daily before a meal. Take 1 tab by breakfast and 1 tab before dinner 180 tablet 4   rivaroxaban   (XARELTO ) 2.5 MG TABS tablet Take 1 tablet by mouth twice daily 60 tablet 11   telmisartan  (MICARDIS ) 80 MG tablet Take 1 tablet (80 mg total) by mouth daily. 90 tablet 3   capecitabine  (XELODA ) 500 MG tablet Take 2 tablets (1000 mg total) by mouth 2 (two) times daily. Take within 30 minutes after meals. Take only on days of radiation, Monday to Friday. 60 tablet 1   Multiple Vitamins-Minerals (MULTI FOR HER PO) Take 1 tablet by mouth daily. (Patient not taking: Reported on 02/06/2024)     No current facility-administered medications for this visit.     Review of Systems    She denies chest pain, palpitations, dyspnea, pnd, orthopnea, n, v, dizziness, syncope, edema, weight gain, or early satiety. All other systems reviewed and are otherwise negative except as noted above.   Physical Exam    VS:  BP (!) 126/56 (BP Location: Right Arm, Patient Position: Sitting, Cuff Size: Normal)   Pulse 84   Ht 5' 5.5" (1.664 m)   Wt 114 lb (51.7 kg)   SpO2 97%   BMI 18.68 kg/m   GEN: Well nourished, well developed, in no acute distress. HEENT: normal. Neck: Supple, no JVD, carotid bruits, or masses. Cardiac: RRR, no murmurs, rubs, or gallops. No clubbing, cyanosis, edema.  Radials/DP/PT 2+ and equal bilaterally.  Respiratory:  Respirations regular and unlabored, clear to auscultation bilaterally. GI: Soft, nontender, nondistended, BS + x 4. MS: no deformity or atrophy. Skin: warm and dry, no rash. Neuro:  Strength and sensation are intact. Psych: Normal affect.  Accessory Clinical Findings    ECG personally reviewed by me today - EKG Interpretation Date/Time:  Thursday Feb 06 2024 16:08:25  EDT Ventricular Rate:  84 PR Interval:  160 QRS Duration:  136 QT Interval:  412 QTC Calculation: 486 R Axis:   -69  Text Interpretation: Normal sinus rhythm Left axis deviation Right bundle branch block Left ventricular hypertrophy with repolarization abnormality ( R in aVL , Romhilt-Estes ) When  compared with ECG of 28-Jun-2022 16:54, PREVIOUS ECG IS PRESENT Confirmed by Marlana Silvan ([id number]) on 02/06/2024 4:20:28 PM  - no acute changes.   Lab Results  Component Value Date   WBC 7.8 01/15/2024   HGB 8.7 (L) 01/15/2024   HCT 25.7 (L) 01/15/2024   MCV 107.1 (H) 01/15/2024   PLT 182 01/15/2024   Lab Results  Component Value Date   CREATININE 1.12 (H) 01/15/2024   BUN 65 (H) 01/15/2024   NA 136 01/15/2024   K 4.5 01/15/2024   CL 106 01/15/2024   CO2 25 01/15/2024   Lab Results  Component Value Date   ALT 12 01/15/2024   AST 22 01/15/2024   ALKPHOS 97 01/15/2024   BILITOT 0.5 01/15/2024   Lab Results  Component Value Date   CHOL 117 09/23/2023   HDL 42 (L) 09/23/2023   LDLCALC 53 09/23/2023   TRIG 140 09/23/2023   CHOLHDL 2.8 09/23/2023    Lab Results  Component Value Date   HGBA1C 6.6 (H) 09/23/2023    Assessment & Plan   1. CAD: S/p CABG x 5 in 2021. Stable with no anginal symptoms. No indication for ischemic evaluation.  Continue aspirin , hydrochlorothiazide , metoprolol , telmisartan , Jardiance , and Crestor  as below.   2. PAD: H/o extensive PAD with critical limb ischemia, nonhealing wounds s/p multiple interventions including bilateral TMA's, stenting of right extrailiac artery, and PTCA to right popliteal and peroneal arteries. ABIs in 05/2023 revealed stable moderate bilateral lower extremity arterial disease.   Denies worsening claudication.  Follows with vascular surgery.  Continue ASA, Xarelto , Crestor  as below.    3. Carotid artery disease: Carotid ultrasound in 11/2023 revealed 40 to 59% R ICA stenosis, greater than 50% ECA stenosis, 1 to 39% LICA stenosis, greater than 50% plaque in the CCA occluded ECA. Continue Crestor  as below.   4. Hypertension: BP well controlled. Continue current antihypertensive regimen.    5. Hyperlipidemia: LDL was 53 in 08/2023. She has had difficulty swallowing her atorvastatin  80 mg.  She has tried cutting that tablet in half,  but it gets stuck in her throat.  Will switch to Crestor  40 mg daily.  Will check fasting lipids, LFTs in 2 to 3 months.     6. Type 2 diabetes: A1c was 6.6 on 08/2023.  Managed per endocrinology.   7. Gallbladder cancer: S/p chemo/radiation.  Following with oncology.  8. Disposition: Follow-up in 6 months.       Jude Norton, NP 02/06/2024, 4:20 PM

## 2024-02-07 NOTE — Progress Notes (Addendum)
  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   Symptoms of fatigue have improved mildly since completing therapy.  Symptoms of skin changes have improved since completing therapy.  Symptoms of nausea or vomiting have improved since completing therapy.  The patient has scheduled follow up with her medical oncologist Dr. Maryalice Smaller for ongoing surveillance, and was encouraged to call if she develops concerns or questions regarding radiation.  This concludes the interaction.  Avery Bodo, LPN

## 2024-02-08 ENCOUNTER — Encounter: Payer: Self-pay | Admitting: Nurse Practitioner

## 2024-02-10 ENCOUNTER — Ambulatory Visit
Admission: RE | Admit: 2024-02-10 | Discharge: 2024-02-10 | Disposition: A | Discharge status: Home / Self Care | Source: Ambulatory Visit | Attending: Nurse Practitioner | Admitting: Nurse Practitioner

## 2024-02-10 ENCOUNTER — Other Ambulatory Visit

## 2024-02-11 DIAGNOSIS — H47011 Ischemic optic neuropathy, right eye: Secondary | ICD-10-CM | POA: Diagnosis not present

## 2024-02-11 DIAGNOSIS — H0102A Squamous blepharitis right eye, upper and lower eyelids: Secondary | ICD-10-CM | POA: Diagnosis not present

## 2024-02-11 DIAGNOSIS — H02831 Dermatochalasis of right upper eyelid: Secondary | ICD-10-CM | POA: Diagnosis not present

## 2024-02-11 DIAGNOSIS — H0102B Squamous blepharitis left eye, upper and lower eyelids: Secondary | ICD-10-CM | POA: Diagnosis not present

## 2024-02-11 DIAGNOSIS — H5052 Exophoria: Secondary | ICD-10-CM | POA: Diagnosis not present

## 2024-02-11 DIAGNOSIS — H02834 Dermatochalasis of left upper eyelid: Secondary | ICD-10-CM | POA: Diagnosis not present

## 2024-02-11 DIAGNOSIS — H1045 Other chronic allergic conjunctivitis: Secondary | ICD-10-CM | POA: Diagnosis not present

## 2024-02-11 DIAGNOSIS — Z961 Presence of intraocular lens: Secondary | ICD-10-CM | POA: Diagnosis not present

## 2024-02-12 ENCOUNTER — Inpatient Hospital Stay: Admitting: Dietician

## 2024-02-12 ENCOUNTER — Encounter (HOSPITAL_COMMUNITY): Payer: Self-pay | Admitting: *Deleted

## 2024-02-12 ENCOUNTER — Inpatient Hospital Stay: Attending: Radiation Oncology

## 2024-02-12 ENCOUNTER — Other Ambulatory Visit: Payer: Self-pay

## 2024-02-12 ENCOUNTER — Inpatient Hospital Stay (HOSPITAL_COMMUNITY)
Admission: EM | Admit: 2024-02-12 | Discharge: 2024-02-18 | DRG: 871 | Disposition: A | Discharge status: Skilled Nursing Facility | Attending: Internal Medicine | Admitting: Internal Medicine

## 2024-02-12 ENCOUNTER — Telehealth: Payer: Self-pay

## 2024-02-12 ENCOUNTER — Emergency Department (HOSPITAL_COMMUNITY)

## 2024-02-12 ENCOUNTER — Inpatient Hospital Stay: Admitting: Hematology

## 2024-02-12 ENCOUNTER — Inpatient Hospital Stay

## 2024-02-12 DIAGNOSIS — Z9221 Personal history of antineoplastic chemotherapy: Secondary | ICD-10-CM

## 2024-02-12 DIAGNOSIS — I959 Hypotension, unspecified: Secondary | ICD-10-CM | POA: Diagnosis not present

## 2024-02-12 DIAGNOSIS — D539 Nutritional anemia, unspecified: Secondary | ICD-10-CM | POA: Diagnosis not present

## 2024-02-12 DIAGNOSIS — Z7401 Bed confinement status: Secondary | ICD-10-CM | POA: Diagnosis not present

## 2024-02-12 DIAGNOSIS — E1165 Type 2 diabetes mellitus with hyperglycemia: Secondary | ICD-10-CM | POA: Diagnosis not present

## 2024-02-12 DIAGNOSIS — K5900 Constipation, unspecified: Secondary | ICD-10-CM | POA: Diagnosis present

## 2024-02-12 DIAGNOSIS — C23 Malignant neoplasm of gallbladder: Secondary | ICD-10-CM | POA: Diagnosis present

## 2024-02-12 DIAGNOSIS — R000 Tachycardia, unspecified: Secondary | ICD-10-CM | POA: Diagnosis not present

## 2024-02-12 DIAGNOSIS — M6259 Muscle wasting and atrophy, not elsewhere classified, multiple sites: Secondary | ICD-10-CM | POA: Diagnosis not present

## 2024-02-12 DIAGNOSIS — R7401 Elevation of levels of liver transaminase levels: Secondary | ICD-10-CM | POA: Diagnosis present

## 2024-02-12 DIAGNOSIS — R188 Other ascites: Secondary | ICD-10-CM | POA: Diagnosis not present

## 2024-02-12 DIAGNOSIS — Z8505 Personal history of malignant neoplasm of liver: Secondary | ICD-10-CM

## 2024-02-12 DIAGNOSIS — N179 Acute kidney failure, unspecified: Secondary | ICD-10-CM | POA: Diagnosis present

## 2024-02-12 DIAGNOSIS — I252 Old myocardial infarction: Secondary | ICD-10-CM

## 2024-02-12 DIAGNOSIS — E876 Hypokalemia: Secondary | ICD-10-CM | POA: Diagnosis present

## 2024-02-12 DIAGNOSIS — I700 Atherosclerosis of aorta: Secondary | ICD-10-CM | POA: Diagnosis not present

## 2024-02-12 DIAGNOSIS — I10 Essential (primary) hypertension: Secondary | ICD-10-CM | POA: Diagnosis present

## 2024-02-12 DIAGNOSIS — G9341 Metabolic encephalopathy: Secondary | ICD-10-CM | POA: Diagnosis not present

## 2024-02-12 DIAGNOSIS — R918 Other nonspecific abnormal finding of lung field: Secondary | ICD-10-CM | POA: Diagnosis not present

## 2024-02-12 DIAGNOSIS — I779 Disorder of arteries and arterioles, unspecified: Secondary | ICD-10-CM | POA: Diagnosis present

## 2024-02-12 DIAGNOSIS — Z7982 Long term (current) use of aspirin: Secondary | ICD-10-CM

## 2024-02-12 DIAGNOSIS — N1831 Chronic kidney disease, stage 3a: Secondary | ICD-10-CM

## 2024-02-12 DIAGNOSIS — L89152 Pressure ulcer of sacral region, stage 2: Secondary | ICD-10-CM | POA: Diagnosis not present

## 2024-02-12 DIAGNOSIS — R8271 Bacteriuria: Secondary | ICD-10-CM | POA: Diagnosis present

## 2024-02-12 DIAGNOSIS — I4891 Unspecified atrial fibrillation: Secondary | ICD-10-CM | POA: Diagnosis present

## 2024-02-12 DIAGNOSIS — I480 Paroxysmal atrial fibrillation: Secondary | ICD-10-CM | POA: Diagnosis present

## 2024-02-12 DIAGNOSIS — J690 Pneumonitis due to inhalation of food and vomit: Secondary | ICD-10-CM | POA: Diagnosis present

## 2024-02-12 DIAGNOSIS — R0902 Hypoxemia: Secondary | ICD-10-CM | POA: Diagnosis not present

## 2024-02-12 DIAGNOSIS — R41841 Cognitive communication deficit: Secondary | ICD-10-CM | POA: Diagnosis not present

## 2024-02-12 DIAGNOSIS — K921 Melena: Secondary | ICD-10-CM | POA: Diagnosis not present

## 2024-02-12 DIAGNOSIS — E119 Type 2 diabetes mellitus without complications: Secondary | ICD-10-CM

## 2024-02-12 DIAGNOSIS — D6959 Other secondary thrombocytopenia: Secondary | ICD-10-CM | POA: Diagnosis present

## 2024-02-12 DIAGNOSIS — L89611 Pressure ulcer of right heel, stage 1: Secondary | ICD-10-CM | POA: Clinically undetermined

## 2024-02-12 DIAGNOSIS — Z888 Allergy status to other drugs, medicaments and biological substances status: Secondary | ICD-10-CM

## 2024-02-12 DIAGNOSIS — R6521 Severe sepsis with septic shock: Secondary | ICD-10-CM | POA: Diagnosis present

## 2024-02-12 DIAGNOSIS — E8721 Acute metabolic acidosis: Secondary | ICD-10-CM | POA: Diagnosis present

## 2024-02-12 DIAGNOSIS — Z83438 Family history of other disorder of lipoprotein metabolism and other lipidemia: Secondary | ICD-10-CM

## 2024-02-12 DIAGNOSIS — I951 Orthostatic hypotension: Secondary | ICD-10-CM | POA: Diagnosis not present

## 2024-02-12 DIAGNOSIS — Z7984 Long term (current) use of oral hypoglycemic drugs: Secondary | ICD-10-CM

## 2024-02-12 DIAGNOSIS — C787 Secondary malignant neoplasm of liver and intrahepatic bile duct: Secondary | ICD-10-CM | POA: Diagnosis present

## 2024-02-12 DIAGNOSIS — I2510 Atherosclerotic heart disease of native coronary artery without angina pectoris: Secondary | ICD-10-CM | POA: Diagnosis not present

## 2024-02-12 DIAGNOSIS — E872 Acidosis, unspecified: Secondary | ICD-10-CM | POA: Diagnosis present

## 2024-02-12 DIAGNOSIS — L89319 Pressure ulcer of right buttock, unspecified stage: Secondary | ICD-10-CM | POA: Clinically undetermined

## 2024-02-12 DIAGNOSIS — Z8249 Family history of ischemic heart disease and other diseases of the circulatory system: Secondary | ICD-10-CM

## 2024-02-12 DIAGNOSIS — E785 Hyperlipidemia, unspecified: Secondary | ICD-10-CM | POA: Diagnosis not present

## 2024-02-12 DIAGNOSIS — L89899 Pressure ulcer of other site, unspecified stage: Secondary | ICD-10-CM | POA: Clinically undetermined

## 2024-02-12 DIAGNOSIS — Z923 Personal history of irradiation: Secondary | ICD-10-CM

## 2024-02-12 DIAGNOSIS — Z951 Presence of aortocoronary bypass graft: Secondary | ICD-10-CM

## 2024-02-12 DIAGNOSIS — R1311 Dysphagia, oral phase: Secondary | ICD-10-CM | POA: Diagnosis not present

## 2024-02-12 DIAGNOSIS — Z881 Allergy status to other antibiotic agents status: Secondary | ICD-10-CM

## 2024-02-12 DIAGNOSIS — N2889 Other specified disorders of kidney and ureter: Secondary | ICD-10-CM | POA: Diagnosis not present

## 2024-02-12 DIAGNOSIS — R531 Weakness: Secondary | ICD-10-CM | POA: Diagnosis not present

## 2024-02-12 DIAGNOSIS — J189 Pneumonia, unspecified organism: Secondary | ICD-10-CM | POA: Diagnosis not present

## 2024-02-12 DIAGNOSIS — M4628 Osteomyelitis of vertebra, sacral and sacrococcygeal region: Secondary | ICD-10-CM | POA: Diagnosis not present

## 2024-02-12 DIAGNOSIS — Z8509 Personal history of malignant neoplasm of other digestive organs: Secondary | ICD-10-CM

## 2024-02-12 DIAGNOSIS — I739 Peripheral vascular disease, unspecified: Secondary | ICD-10-CM | POA: Diagnosis present

## 2024-02-12 DIAGNOSIS — Z89422 Acquired absence of other left toe(s): Secondary | ICD-10-CM

## 2024-02-12 DIAGNOSIS — I499 Cardiac arrhythmia, unspecified: Secondary | ICD-10-CM | POA: Diagnosis not present

## 2024-02-12 DIAGNOSIS — Z823 Family history of stroke: Secondary | ICD-10-CM

## 2024-02-12 DIAGNOSIS — E113393 Type 2 diabetes mellitus with moderate nonproliferative diabetic retinopathy without macular edema, bilateral: Secondary | ICD-10-CM | POA: Diagnosis not present

## 2024-02-12 DIAGNOSIS — L89139 Pressure ulcer of right lower back, unspecified stage: Secondary | ICD-10-CM | POA: Clinically undetermined

## 2024-02-12 DIAGNOSIS — R0689 Other abnormalities of breathing: Secondary | ICD-10-CM | POA: Diagnosis not present

## 2024-02-12 DIAGNOSIS — Z794 Long term (current) use of insulin: Secondary | ICD-10-CM | POA: Diagnosis not present

## 2024-02-12 DIAGNOSIS — A419 Sepsis, unspecified organism: Principal | ICD-10-CM | POA: Diagnosis present

## 2024-02-12 DIAGNOSIS — Z808 Family history of malignant neoplasm of other organs or systems: Secondary | ICD-10-CM

## 2024-02-12 DIAGNOSIS — M6281 Muscle weakness (generalized): Secondary | ICD-10-CM | POA: Diagnosis not present

## 2024-02-12 DIAGNOSIS — R6520 Severe sepsis without septic shock: Principal | ICD-10-CM | POA: Diagnosis present

## 2024-02-12 DIAGNOSIS — L89156 Pressure-induced deep tissue damage of sacral region: Secondary | ICD-10-CM | POA: Diagnosis not present

## 2024-02-12 DIAGNOSIS — Z1152 Encounter for screening for COVID-19: Secondary | ICD-10-CM | POA: Diagnosis not present

## 2024-02-12 DIAGNOSIS — K644 Residual hemorrhoidal skin tags: Secondary | ICD-10-CM | POA: Diagnosis present

## 2024-02-12 DIAGNOSIS — B961 Klebsiella pneumoniae [K. pneumoniae] as the cause of diseases classified elsewhere: Secondary | ICD-10-CM | POA: Diagnosis present

## 2024-02-12 DIAGNOSIS — E1151 Type 2 diabetes mellitus with diabetic peripheral angiopathy without gangrene: Secondary | ICD-10-CM | POA: Diagnosis present

## 2024-02-12 DIAGNOSIS — Z885 Allergy status to narcotic agent status: Secondary | ICD-10-CM

## 2024-02-12 DIAGNOSIS — D8481 Immunodeficiency due to conditions classified elsewhere: Secondary | ICD-10-CM | POA: Diagnosis not present

## 2024-02-12 DIAGNOSIS — Z79899 Other long term (current) drug therapy: Secondary | ICD-10-CM

## 2024-02-12 DIAGNOSIS — Z882 Allergy status to sulfonamides status: Secondary | ICD-10-CM

## 2024-02-12 DIAGNOSIS — R2689 Other abnormalities of gait and mobility: Secondary | ICD-10-CM | POA: Diagnosis not present

## 2024-02-12 HISTORY — DX: Unspecified atrial fibrillation: I48.91

## 2024-02-12 LAB — I-STAT CG4 LACTIC ACID, ED
Lactic Acid, Venous: 7.8 mmol/L (ref 0.5–1.9)
Lactic Acid, Venous: 4.3 mmol/L (ref 0.5–1.9)

## 2024-02-12 LAB — URINALYSIS, W/ REFLEX TO CULTURE (INFECTION SUSPECTED)
Bilirubin Urine: NEGATIVE
Glucose, UA: >=500 mg/dL — AB
Hgb urine dipstick: NEGATIVE
Ketones, ur: NEGATIVE mg/dL
Nitrite: NEGATIVE
Protein, ur: 30 mg/dL — AB
Specific Gravity, Urine: 1.013 (ref 1.005–1.030)
pH: 5 (ref 5.0–8.0)

## 2024-02-12 LAB — CBC WITH DIFFERENTIAL/PLATELET
Abs Immature Granulocytes: 0.1 10*3/uL — ABNORMAL HIGH (ref 0.00–0.07)
Basophils Absolute: 0 10*3/uL (ref 0.0–0.1)
Basophils Relative: 0 %
Eosinophils Absolute: 0 10*3/uL (ref 0.0–0.5)
Eosinophils Relative: 0 %
HCT: 35.8 % — ABNORMAL LOW (ref 36.0–46.0)
Hemoglobin: 12 g/dL (ref 12.0–15.0)
Immature Granulocytes: 1 %
Lymphocytes Relative: 2 %
Lymphs Abs: 0.3 10*3/uL — ABNORMAL LOW (ref 0.7–4.0)
MCH: 35.2 pg — ABNORMAL HIGH (ref 26.0–34.0)
MCHC: 33.5 g/dL (ref 30.0–36.0)
MCV: 105 fL — ABNORMAL HIGH (ref 80.0–100.0)
Monocytes Absolute: 0.7 10*3/uL (ref 0.1–1.0)
Monocytes Relative: 5 %
Neutro Abs: 12.9 10*3/uL — ABNORMAL HIGH (ref 1.7–7.7)
Neutrophils Relative %: 92 %
Platelets: 224 10*3/uL (ref 150–400)
RBC: 3.41 MIL/uL — ABNORMAL LOW (ref 3.87–5.11)
RDW: 18 % — ABNORMAL HIGH (ref 11.5–15.5)
WBC: 14 10*3/uL — ABNORMAL HIGH (ref 4.0–10.5)
nRBC: 0.3 % — ABNORMAL HIGH (ref 0.0–0.2)

## 2024-02-12 LAB — CBG MONITORING, ED: Glucose-Capillary: 177 mg/dL — ABNORMAL HIGH (ref 70–99)

## 2024-02-12 LAB — LACTIC ACID, PLASMA: Lactic Acid, Venous: 2.9 mmol/L (ref 0.5–1.9)

## 2024-02-12 LAB — COMPREHENSIVE METABOLIC PANEL WITH GFR
ALT: 86 U/L — ABNORMAL HIGH (ref 0–44)
AST: 264 U/L — ABNORMAL HIGH (ref 15–41)
Albumin: 2.4 g/dL — ABNORMAL LOW (ref 3.5–5.0)
Alkaline Phosphatase: 84 U/L (ref 38–126)
Anion gap: 21 — ABNORMAL HIGH (ref 5–15)
BUN: 81 mg/dL — ABNORMAL HIGH (ref 8–23)
CO2: 11 mmol/L — ABNORMAL LOW (ref 22–32)
Calcium: 10.2 mg/dL (ref 8.9–10.3)
Chloride: 105 mmol/L (ref 98–111)
Creatinine, Ser: 2.31 mg/dL — ABNORMAL HIGH (ref 0.44–1.00)
GFR, Estimated: 21 mL/min — ABNORMAL LOW (ref >60)
Glucose, Bld: 132 mg/dL — ABNORMAL HIGH (ref 70–99)
Potassium: 4 mmol/L (ref 3.5–5.1)
Sodium: 137 mmol/L (ref 135–145)
Total Bilirubin: 0.9 mg/dL (ref 0.0–1.2)
Total Protein: 5.5 g/dL — ABNORMAL LOW (ref 6.5–8.1)

## 2024-02-12 LAB — BASIC METABOLIC PANEL WITH GFR
Anion gap: 14 (ref 5–15)
BUN: 74 mg/dL — ABNORMAL HIGH (ref 8–23)
CO2: 17 mmol/L — ABNORMAL LOW (ref 22–32)
Calcium: 9.7 mg/dL (ref 8.9–10.3)
Chloride: 106 mmol/L (ref 98–111)
Creatinine, Ser: 1.93 mg/dL — ABNORMAL HIGH (ref 0.44–1.00)
GFR, Estimated: 26 mL/min — ABNORMAL LOW (ref >60)
Glucose, Bld: 197 mg/dL — ABNORMAL HIGH (ref 70–99)
Potassium: 3.7 mmol/L (ref 3.5–5.1)
Sodium: 137 mmol/L (ref 135–145)

## 2024-02-12 LAB — PROTIME-INR
INR: 2.1 — ABNORMAL HIGH (ref 0.8–1.2)
Prothrombin Time: 23.7 s — ABNORMAL HIGH (ref 11.4–15.2)

## 2024-02-12 LAB — RESP PANEL BY RT-PCR (RSV, FLU A&B, COVID)  RVPGX2
Influenza A by PCR: NEGATIVE
Influenza B by PCR: NEGATIVE
Resp Syncytial Virus by PCR: NEGATIVE
SARS Coronavirus 2 by RT PCR: NEGATIVE

## 2024-02-12 MED ORDER — SODIUM CHLORIDE 0.9 % IV SOLN
2.0000 g | INTRAVENOUS | Status: DC
  Administered 2024-02-13: 2 g via INTRAVENOUS
  Filled 2024-02-12: qty 12.5

## 2024-02-12 MED ORDER — ACETAMINOPHEN 325 MG PO TABS
650.0000 mg | ORAL_TABLET | Freq: Four times a day (QID) | ORAL | Status: DC | PRN
  Administered 2024-02-13: 650 mg via ORAL
  Filled 2024-02-12: qty 2

## 2024-02-12 MED ORDER — SODIUM CHLORIDE 0.9 % IV SOLN
2.0000 g | Freq: Once | INTRAVENOUS | Status: AC
  Administered 2024-02-12: 2 g via INTRAVENOUS
  Filled 2024-02-12: qty 12.5

## 2024-02-12 MED ORDER — SENNOSIDES-DOCUSATE SODIUM 8.6-50 MG PO TABS: 2.0000 | ORAL_TABLET | Freq: Two times a day (BID) | ORAL | Status: DC

## 2024-02-12 MED ORDER — POLYETHYLENE GLYCOL 3350 17 G PO PACK: 17.0000 g | PACK | Freq: Two times a day (BID) | ORAL | Status: DC

## 2024-02-12 MED ORDER — METOPROLOL TARTRATE 25 MG PO TABS: 25.0000 mg | ORAL_TABLET | Freq: Two times a day (BID) | ORAL | Status: DC

## 2024-02-12 MED ORDER — ASPIRIN 81 MG PO TBEC
81.0000 mg | DELAYED_RELEASE_TABLET | Freq: Every day | ORAL | Status: DC
  Administered 2024-02-12 – 2024-02-15 (×4): 81 mg via ORAL
  Filled 2024-02-12 (×4): qty 1

## 2024-02-12 MED ORDER — ACETAMINOPHEN 650 MG RE SUPP: 650.0000 mg | Freq: Four times a day (QID) | RECTAL | Status: DC | PRN

## 2024-02-12 MED ORDER — ONDANSETRON HCL 4 MG PO TABS: 4.0000 mg | ORAL_TABLET | Freq: Four times a day (QID) | ORAL | Status: DC | PRN

## 2024-02-12 MED ORDER — VANCOMYCIN VARIABLE DOSE PER UNSTABLE RENAL FUNCTION (PHARMACIST DOSING): Status: DC

## 2024-02-12 MED ORDER — ACETAMINOPHEN 650 MG RE SUPP: 650.0000 mg | Freq: Once | RECTAL | Status: AC

## 2024-02-12 MED ORDER — METRONIDAZOLE 500 MG/100ML IV SOLN
500.0000 mg | Freq: Once | INTRAVENOUS | Status: AC
  Administered 2024-02-12: 500 mg via INTRAVENOUS
  Filled 2024-02-12: qty 100

## 2024-02-12 MED ORDER — BRIMONIDINE TARTRATE 0.2 % OP SOLN
1.0000 [drp] | Freq: Two times a day (BID) | OPHTHALMIC | Status: DC
  Administered 2024-02-12 – 2024-02-18 (×12): 1 [drp] via OPHTHALMIC
  Filled 2024-02-12 (×7): qty 5

## 2024-02-12 MED ORDER — LACTATED RINGERS IV SOLN: INTRAVENOUS | Status: AC

## 2024-02-12 MED ORDER — CEFEPIME HCL 2 G IV SOLR: 2.0000 g | Freq: Once | INTRAVENOUS | Status: DC

## 2024-02-12 MED ORDER — LACTATED RINGERS IV BOLUS (SEPSIS)
1000.0000 mL | Freq: Once | INTRAVENOUS | Status: AC
  Administered 2024-02-12: 1000 mL via INTRAVENOUS

## 2024-02-12 MED ORDER — ACETAMINOPHEN 650 MG RE SUPP
RECTAL | Status: AC
  Administered 2024-02-12: 650 mg via RECTAL
  Filled 2024-02-12: qty 1

## 2024-02-12 MED ORDER — METOPROLOL TARTRATE 25 MG PO TABS
25.0000 mg | ORAL_TABLET | Freq: Two times a day (BID) | ORAL | Status: DC
  Administered 2024-02-13 – 2024-02-18 (×9): 25 mg via ORAL
  Filled 2024-02-12 (×11): qty 1

## 2024-02-12 MED ORDER — INSULIN ASPART 100 UNIT/ML IJ SOLN
0.0000 [IU] | Freq: Three times a day (TID) | INTRAMUSCULAR | Status: DC
  Administered 2024-02-13 – 2024-02-15 (×3): 1 [IU] via SUBCUTANEOUS
  Administered 2024-02-15: 2 [IU] via SUBCUTANEOUS
  Administered 2024-02-16: 3 [IU] via SUBCUTANEOUS
  Administered 2024-02-16: 2 [IU] via SUBCUTANEOUS
  Administered 2024-02-16: 1 [IU] via SUBCUTANEOUS
  Administered 2024-02-17 (×2): 3 [IU] via SUBCUTANEOUS
  Administered 2024-02-17: 2 [IU] via SUBCUTANEOUS
  Administered 2024-02-18: 3 [IU] via SUBCUTANEOUS
  Administered 2024-02-18: 2 [IU] via SUBCUTANEOUS

## 2024-02-12 MED ORDER — VANCOMYCIN HCL IN DEXTROSE 1-5 GM/200ML-% IV SOLN: 1000.0000 mg | Freq: Once | INTRAVENOUS | Status: DC

## 2024-02-12 MED ORDER — ONDANSETRON HCL 4 MG/2ML IJ SOLN: 4.0000 mg | Freq: Four times a day (QID) | INTRAMUSCULAR | Status: DC | PRN

## 2024-02-12 MED ORDER — VANCOMYCIN HCL IN DEXTROSE 1-5 GM/200ML-% IV SOLN
1000.0000 mg | Freq: Once | INTRAVENOUS | Status: AC
  Administered 2024-02-12: 1000 mg via INTRAVENOUS
  Filled 2024-02-12: qty 200

## 2024-02-12 MED ORDER — ENOXAPARIN SODIUM 60 MG/0.6ML IJ SOSY
50.0000 mg | PREFILLED_SYRINGE | INTRAMUSCULAR | Status: DC
  Administered 2024-02-12 – 2024-02-13 (×2): 50 mg via SUBCUTANEOUS
  Filled 2024-02-12 (×2): qty 0.6

## 2024-02-12 NOTE — Progress Notes (Deleted)
 [facility] History and Physical  KISHA MESSMAN ZOX:[id number] DOB: 1940/11/20 DOA: 02/12/2024   PCP: Ransom Byers, MD  Specialists: Dr. Katheryne Pane is her cardiologist.  She is followed by Dr. Maryalice Smaller with medical oncology.  Also followed by radiation oncology.  Chief Complaint: Weak, mental confusion  HPI: [name] is a 83 y.o. female with a past medical history of essential hypertension, diabetes mellitus type 2, coronary artery disease, peripheral artery disease status post multiple interventions previously, status post CABG previously who is under the care of cancer Center for history of gallbladder cancer.  She has completed radiation treatment.  She has also continue treatment with the capecitabine .  She continues to get treatment with durvalumab .  She completed radiation treatment in April.  Her last infusion with the durvalumab  was on April 23.  Patient was supposed to have appointments today at cancer center which she missed.  Daughter was called who went to check on the patient and she found the patient lying on the bed somewhat confused.  EMS was called.  She was noted to be tachycardic by EMS.  She was brought into the hospital.  Patient somewhat distracted.  According to daughter her mentation has improved but not back to baseline yet.  She denies any pain issues.  Denies any cough.  Does mention that she has had a few episodes of vomiting in the last few days which is more than usual.  Denies any abdominal pain.  Has been constipated although her last bowel movement was within the last 24 to 48 hours.  She does not have regular bowel movement every day.  In the emergency department she was found to have tachycardia.  She was noted to be febrile.  Blood work showed acute kidney injury with metabolic acidosis.  She was found to have leukocytosis.  Imaging study showed evidence for pneumonia in the right lung.  Initial EKG showed atrial fibrillation with RVR.  Patient will be  hospitalized for further management.    Home Medications: Prior to Admission medications   Medication Sig Start Date End Date Taking? Authorizing Provider  aspirin  EC 81 MG tablet Take 81 mg by mouth at bedtime. Swallow whole.   Yes [provider]  brimonidine  (ALPHAGAN ) 0.2 % ophthalmic solution Place 1 drop into the right eye 2 (two) times daily. 01/19/21  Yes [provider]  empagliflozin  (JARDIANCE ) 25 MG TABS tablet Take 1 tablet (25 mg total) by mouth daily before breakfast. 07/29/23  Yes Thapa, [place], MD  hydrochlorothiazide  (HYDRODIURIL ) 12.5 MG tablet Take 1 tablet by mouth once daily 12/17/23  Yes Thapa, [place], MD  Insulin  Lispro Prot & Lispro (HUMALOG  MIX 75/25 KWIKPEN) (75-25) 100 UNIT/ML Kwikpen INJECT 6 UNITS SUBCUTANEOUSLY ONCE DAILY WITH BREAKFAST 09/26/23  Yes Thapa, [place], MD  MAGnesium -Oxide 400 (240 Mg) MG tablet Take 1 tablet by mouth twice daily 01/22/24  Yes Boscia, Heather E, NP  metFORMIN  (GLUCOPHAGE ) 1000 MG tablet Take 1 tablet (1,000 mg total) by mouth 2 (two) times daily. 08/21/23  Yes Avanell Leigh, MD  metoprolol  tartrate (LOPRESSOR ) 50 MG tablet Take 1 tablet (50 mg total) by mouth 2 (two) times daily. 01/08/24  Yes Avanell Leigh, MD  mupirocin  ointment (BACTROBAN ) 2 % Apply 1 Application topically 2 (two) times daily. Patient taking differently: Apply 1 Application topically 2 (two) times daily as needed (wound care for toes). 03/14/23  Yes Charity Conch, DPM  prochlorperazine  (COMPAZINE ) 10 MG tablet Take 10 mg by mouth every  6 (six) hours as needed. 02/05/24  Yes [provider]  Propylene Glycol (SYSTANE BALANCE) 0.6 % SOLN Place 1 drop into the left eye 3 (three) times daily as needed (dry/irritated eyes).   Yes [provider]  repaglinide  (PRANDIN ) 1 MG tablet Take 1 tablet (1 mg total) by mouth 2 (two) times daily before a meal. Take 1 tab by breakfast and 1 tab before dinner 09/26/23  Yes Thapa, [place], MD  rivaroxaban   (XARELTO ) 2.5 MG TABS tablet Take 1 tablet by mouth twice daily 06/20/23  Yes Carlene Che, MD  rosuvastatin  (CRESTOR ) 40 MG tablet Take 1 tablet (40 mg total) by mouth daily. 02/06/24 05/06/24 Yes Monge, Nelva Bang, NP  telmisartan  (MICARDIS ) 80 MG tablet Take 1 tablet (80 mg total) by mouth daily. 01/08/24  Yes Avanell Leigh, MD  capecitabine  (XELODA ) 500 MG tablet Take 2 tablets (1000 mg total) by mouth 2 (two) times daily. Take within 30 minutes after meals. Take only on days of radiation, Monday to Friday. 12/09/23   Sonja Victoria, MD  Insulin  Pen Needle (ULTRA-THIN II MINI PEN NEEDLE) 31G X 5 MM MISC Use as directed to check blood glucose 07/29/23   Thapa, [place], MD  Lancets [facility] DELICA PLUS Proberta) MISC Use to check blood sugars twice daily. Patient taking differently: Use to check blood sugars  daily. 04/24/21   Lajean Pike, MD  [facility] VERIO test strip USE TO CHECK BLOOD SUGARS ONCE DAILY 07/19/22   Lajean Pike, MD    Allergies:  Allergies  Allergen Reactions   Versed  [Midazolam ] Anxiety    Frantic, out of my mind, agitated    Augmentin [Amoxicillin-Pot Clavulanate] Other (See Comments)    Elevates potassium level   Bactrim  [Sulfamethoxazole -Trimethoprim ] Other (See Comments)    ^ K+( elevated)    Demerol [Meperidine]     Delusional    Meperidine Hcl     Other reaction(s): delusional   Scopolamine     Delusional   Patch *   Tramadol  Other (See Comments)    Keeps awake    Past Medical History: Past Medical History:  Diagnosis Date   Anemia    after CABG in june 2021   Arthritis    Cancer (HCC)    removal from nose - MOSE procedure    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 There is a small calcified mass in the L:A near the LA appendage. No valvular masses seen with associated mitral annular calcification. LA Volume/ BSA27.4 ml/m2 No AS. Right ventricular systolic pressure is elevated at .   Jaundice    Myocardial infarction [facility])    June 2021   Neuromuscular disorder (HCC)    neuropathy in bilateral feet   Peripheral vascular disease (HCC)    Pneumonia    not hosp.    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  PTCAs with drug coated balloon Lt ant tibial artery and Lt popliteal artery  11/26/2019    Past Surgical History:  Procedure Laterality Date  ABDOMINAL AORTAGRAM  11/25/2019   ABDOMINAL AORTOGRAM W/LOWER EXTREMITY    ABDOMINAL AORTOGRAM N/A 06/15/2019   Procedure: ABDOMINAL AORTOGRAM;  Surgeon: Avanell Leigh, MD;  [facility];  Service: Cardiovascular;  Laterality: N/A;   ABDOMINAL AORTOGRAM W/LOWER EXTREMITY N/A 11/25/2019   Procedure: ABDOMINAL AORTOGRAM W/LOWER EXTREMITY;  Surgeon: Wenona Hamilton, MD;  [facility];  Service: Cardiovascular;  Laterality: N/A;  Lt leg   ABDOMINAL AORTOGRAM W/LOWER EXTREMITY N/A 08/01/2020   Procedure: ABDOMINAL AORTOGRAM W/LOWER EXTREMITY;  Surgeon: Avanell Leigh, MD;  [facility];  Service: Cardiovascular;  Laterality: N/A;   ABDOMINAL AORTOGRAM W/LOWER EXTREMITY N/A 04/28/2021   Procedure: ABDOMINAL AORTOGRAM W/LOWER EXTREMITY;  Surgeon: Richrd Char, MD;  [facility];  Service: Cardiovascular;  Laterality: N/A;   ABDOMINAL AORTOGRAM W/LOWER EXTREMITY N/A 10/13/2021   Procedure: ABDOMINAL AORTOGRAM W/LOWER EXTREMITY;  Surgeon: Carlene Che, MD;  [facility];  Service: Cardiovascular;  Laterality: N/A;   ABDOMINAL AORTOGRAM W/LOWER EXTREMITY N/A 11/09/2022    Procedure: ABDOMINAL AORTOGRAM W/LOWER EXTREMITY;  Surgeon: Carlene Che, MD;  [facility];  Service: Cardiovascular;  Laterality: N/A;   ABDOMINAL AORTOGRAM W/LOWER EXTREMITY N/A 11/16/2022   Procedure: ABDOMINAL AORTOGRAM W/LOWER EXTREMITY;  Surgeon: Carlene Che, MD;  [facility];  Service: Cardiovascular;  Laterality: N/A;   AMPUTATION TOE Left 05/03/2021   Procedure: AMPUTATION OF THIRD LEFT  TOE;  Surgeon: Carlene Che, MD;  Location[facility] OR;  Service: Vascular;  Laterality: Left;   APPENDECTOMY     APPLICATION OF WOUND VAC Right 07/21/2019   Procedure: Application Of Prevena Wound Vac Right Groin;  Surgeon: Margherita Shell, MD;  Location: [facility] OR;  Service: Vascular;  Laterality: Right;   BILIARY BRUSHING  04/07/2023   Procedure: BILIARY BRUSHING;  Surgeon: Ozell Blunt, MD;  Location: [facility] ENDOSCOPY;  Service: Gastroenterology;;   BILIARY STENT PLACEMENT  04/07/2023   Procedure: BILIARY STENT PLACEMENT;  Surgeon: Ozell Blunt, MD;  Location: [facility] ENDOSCOPY;  Service: Gastroenterology;;   CARPAL TUNNEL RELEASE Left    CARPAL TUNNEL RELEASE Right    CESAREAN SECTION     x 2   CHOLECYSTECTOMY N/A 10/12/2022   Procedure: LAPAROSCOPIC PARTIAL FENESTRATING CHOLECYSTECTOMY;  Surgeon: Lujean Sake, MD;  [facility];  Service: General;  Laterality: N/A;   COLONOSCOPY     CORONARY ARTERY BYPASS GRAFT N/A 03/24/2020   Procedure: CORONARY ARTERY BYPASS GRAFTING (CABG) using LIMA to LAD (m); RIMA to RAMUS; Endoscopic Right Greater Saphenous Vein: SVG to Diag1; SVG to PLB (right); and SVG to PL (left).;  Surgeon: Rudine Cos, MD;  [facility];  Service: Open Heart Surgery;  Laterality: N/A;  BILATERAL IMA   ENDARTERECTOMY FEMORAL Right 07/21/2019   Procedure: RIGHT ENDARTERECTOMY FEMORAL WITH PATCH ANGIOPLASTY;  Surgeon: Margherita Shell, MD;  [facility];  Service: Vascular;  Laterality: Right;   ENDARTERECTOMY FEMORAL Right 08/16/2020    Procedure: RIGHT FEMORAL ENDARTERECTOMY;  Surgeon: Adine Hoof, MD;  Location: [facility] OR;  Service: Vascular;  Laterality: Right;   ENDOVEIN HARVEST OF GREATER SAPHENOUS VEIN Right 03/24/2020   Procedure: ENDOVEIN HARVEST OF GREATER SAPHENOUS VEIN;  Surgeon: Rudine Cos, MD;  [facility];  Service: Open Heart Surgery;  Laterality: Right;   ERCP N/A 04/07/2023   Procedure: ENDOSCOPIC RETROGRADE CHOLANGIOPANCREATOGRAPHY (ERCP);  Surgeon: Ozell Blunt, MD;  Location: [facility] ENDOSCOPY;  Service: Gastroenterology;  Laterality: N/A;   EYE SURGERY     cataract removal bilaterally   FEMORAL-POPLITEAL BYPASS GRAFT Right 08/16/2020   Procedure: BYPASS GRAFT FEMORAL-POPLITEAL ARTERY;  Surgeon: Adine Hoof, MD;  Location[facility] OR;  Service: Vascular;  Laterality: Right;   FEMORAL-POPLITEAL BYPASS GRAFT Left 05/03/2021   Procedure: LEFT FEMORAL TO BELOW KNEE POPLITEAL ARTERY BYPASS GRAFTING WITH 6MMX80 PTFE GRAFT;  Surgeon: Carlene Che, MD;  [facility];  Service: Vascular;  Laterality: Left;   LEFT HEART CATH AND CORONARY ANGIOGRAPHY N/A 03/22/2020   Procedure: LEFT HEART CATH AND CORONARY ANGIOGRAPHY;  Surgeon: Swaziland, Peter M, MD;  Location: [facility] INVASIVE CV LAB;  Service: Cardiovascular;  Laterality: N/A;   LOWER EXTREMITY ANGIOGRAPHY Bilateral 06/15/2019   Procedure: Lower Extremity Angiography;  Surgeon: Avanell Leigh, MD;  Location: [facility] INVASIVE CV LAB;  Service: Cardiovascular;  Laterality: Bilateral;   LOWER EXTREMITY ANGIOGRAPHY Right 08/03/2019   Procedure: LOWER EXTREMITY ANGIOGRAPHY;  Surgeon: Avanell Leigh, MD;  [facility];  Service: Cardiovascular;  Laterality: Right;   LOWER EXTREMITY ANGIOGRAPHY N/A 09/07/2021   Procedure: LOWER EXTREMITY ANGIOGRAPHY;  Surgeon: Young Hensen, MD;  [facility];  Service: Cardiovascular;  Laterality: N/A;   PATCH ANGIOPLASTY Right 07/21/2019   Procedure: Patch Angioplasty Right  Femoral Artery;  Surgeon: Margherita Shell, MD;  Location: [facility] OR;  Service: Vascular;  Laterality: Right;   PERIPHERAL INTRAVASCULAR LITHOTRIPSY  11/25/2019   Procedure: INTRAVASCULAR LITHOTRIPSY;  Surgeon: Wenona Hamilton, MD;  [facility];  Service: Cardiovascular;;  LT. SFA   PERIPHERAL VASCULAR ATHERECTOMY  08/03/2019   Procedure: PERIPHERAL VASCULAR ATHERECTOMY;  Surgeon: Avanell Leigh, MD;  Location: [facility] INVASIVE CV LAB;  Service: Cardiovascular;;  right SFA, right TP trunk   PERIPHERAL VASCULAR ATHERECTOMY  11/25/2019   Procedure: PERIPHERAL VASCULAR ATHERECTOMY;  Surgeon: Wenona Hamilton, MD;  [facility];  Service: Cardiovascular;;  Lt.  POPLITEAL and AT   PERIPHERAL VASCULAR BALLOON ANGIOPLASTY Left 06/15/2019   Procedure: PERIPHERAL VASCULAR BALLOON ANGIOPLASTY;  Surgeon: Avanell Leigh, MD;  [facility];  Service: Cardiovascular;  Laterality: Left;  SFA UNSUCCESSFUL UNABLE TO CROSS LESION   PERIPHERAL VASCULAR BALLOON ANGIOPLASTY  08/03/2019   Procedure: PERIPHERAL VASCULAR BALLOON ANGIOPLASTY;  Surgeon: Avanell Leigh, MD;  [facility];  Service: Cardiovascular;;  right SFA, Right TP trunk   PERIPHERAL VASCULAR BALLOON ANGIOPLASTY  09/07/2021   Procedure: PERIPHERAL VASCULAR BALLOON ANGIOPLASTY;  Surgeon: Young Hensen, MD;  [facility];  Service: Cardiovascular;;   PERIPHERAL VASCULAR BALLOON ANGIOPLASTY Right 10/13/2021   Procedure: PERIPHERAL VASCULAR BALLOON ANGIOPLASTY;  Surgeon: Carlene Che, MD;  [facility];  Service: Cardiovascular;  Laterality: Right;   PERIPHERAL VASCULAR BALLOON ANGIOPLASTY  11/09/2022   Procedure: PERIPHERAL VASCULAR BALLOON ANGIOPLASTY;  Surgeon: Carlene Che, MD;  [facility];  Service: Cardiovascular;;  fem-pop bypass and AT   PORTACATH PLACEMENT Left 04/29/2023   Procedure: INSERTION PORT-A-CATH;  Surgeon:  Lujean Sake, MD;  Location: [facility] ORS;  Service: General;  Laterality: Left;   PORTACATH PLACEMENT N/A 07/12/2023   Procedure: REVISION PORT-A-CATH;  Surgeon: Lujean Sake, MD;  Location: [facility] OR;  Service: General;  Laterality: N/A;   REMOVAL OF STONES  04/07/2023   Procedure: REMOVAL OF STONES;  Surgeon: Ozell Blunt, MD;  Location: [facility] ENDOSCOPY;  Service: Gastroenterology;;   [address]  04/07/2023  Procedure: SPHINCTEROTOMY;  Surgeon: Ozell Blunt, MD;  Location: [facility] ENDOSCOPY;  Service: Gastroenterology;;   TEE WITHOUT CARDIOVERSION N/A 03/24/2020   Procedure: TRANSESOPHAGEAL ECHOCARDIOGRAM (TEE);  Surgeon: Rudine Cos, MD;  Location: [facility] OR;  Service: Open Heart Surgery;  Laterality: N/A;   TONSILLECTOMY     and adenoidectomy   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/2019   Procedure: TRANSMETATARSAL AMPUTATION;  Surgeon: Charity Conch, DPM;  [facility];  Service: Podiatry;  Laterality: Right;   TRANSMETATARSAL AMPUTATION Left 05/11/2021   Procedure: LEFT TRANSMETATARSAL AMPUTATION;  Surgeon: Adine Hoof, MD;  Location: [facility] OR;  Service: Vascular;  Laterality: Left;   TUBAL LIGATION      Social History: Lives in [place].  Her daughter stays with her.  No current history of smoking alcohol  use or illicit drug usage.  She does have a living will.  She is a full code.  Ambulates using a cane or a walker.  Family History:  Family History  Problem Relation Age of Onset   Cancer - Prostate Father    Cancer - Colon Father    Stroke Mother    Hypertension Mother    Hyperlipidemia Mother    Melanoma Brother      Review of Systems - History obtained from the patient General ROS: positive for  - fatigue and fever Psychological ROS: negative Ophthalmic ROS: negative ENT ROS: negative Allergy and Immunology ROS: negative Hematological and Lymphatic ROS: negative Endocrine ROS: negative Respiratory ROS: no cough, shortness of breath, or  wheezing Cardiovascular ROS: no chest pain or dyspnea on exertion Gastrointestinal ROS: no abdominal pain, change in bowel habits, or black or bloody stools Genito-Urinary ROS: no dysuria, trouble voiding, or hematuria Musculoskeletal ROS: negative Neurological ROS: no TIA or stroke symptoms Dermatological ROS: negative  Physical Examination  Vitals:   02/12/24 1445 02/12/24 1445 02/12/24 1545 02/12/24 1600  BP: (!) 109/47  (!) 130/53 133/72  Pulse: (!) 126  (!) 121 (!) 125  Resp: 14  (!) 25 (!) 23  Temp:  97.8 F (36.6 C)    TempSrc:  Oral    SpO2: 97%  99% 92%  Weight:      Height:        BP 133/72   Pulse (!) 125   Temp 97.8 F (36.6 C) (Oral)   Resp (!) 23   Ht 5' 5.5" (1.664 m)   Wt 51.7 kg   SpO2 92%   BMI 18.68 kg/m   General appearance: alert, cooperative, appears stated age, distracted, and no distress Head: Normocephalic, without obvious abnormality, atraumatic Eyes: conjunctivae/corneas clear. PERRL, EOM's intact. Throat: lips, mucosa, and tongue normal; teeth and gums normal Back: symmetric, no curvature. ROM normal. No CVA tenderness. Resp: Normal effort at rest.  Crackles heard right base with rhonchi.  No wheezing appreciated. Cardio: S1-S2 is tachycardic regular.  No S3-S4.  No rubs murmurs or bruit. GI: soft, non-tender; bowel sounds normal; no masses,  no organomegaly Extremities: extremities normal, atraumatic, no cyanosis or edema Pulses: 2+ and symmetric Skin: Skin color, texture, turgor normal. No rashes or lesions Lymph nodes: Cervical, supraclavicular, and axillary nodes normal. Neurologic: Awake alert.  Oriented to city person.  Knew the year but did not know the month.  No obvious focal neurological deficits noted.   Labs on Admission: I have personally reviewed following labs and imaging studies  CBC: Recent Labs  Lab 02/12/24 1330  WBC 14.0*  NEUTROABS 12.9*  HGB 12.0  HCT 35.8*  MCV 105.0*  PLT 224   Basic Metabolic  Panel: Recent Labs  Lab 02/12/24 1330  NA 137  K 4.0  CL 105  CO2 11*  GLUCOSE 132*  BUN 81*  CREATININE 2.31*  CALCIUM  10.2   GFR: Estimated Creatinine Clearance: 15.6 mL/min (A) (by C-G formula based on SCr of 2.31 mg/dL (H)). Liver Function Tests: Recent Labs  Lab 02/12/24 1330  AST 264*  ALT 86*  ALKPHOS 84  BILITOT 0.9  PROT 5.5*  ALBUMIN  2.4*   Coagulation Profile: Recent Labs  Lab 02/12/24 1330  INR 2.1*    Radiological Exams on Admission: CT ABDOMEN PELVIS WO CONTRAST Result Date: 02/12/2024 CLINICAL DATA:  Sepsis EXAM: CT ABDOMEN AND PELVIS WITHOUT CONTRAST TECHNIQUE: Multidetector CT imaging of the abdomen and pelvis was performed following the standard protocol without IV contrast. RADIATION DOSE REDUCTION: This exam was performed according to the departmental dose-optimization program which includes automated exposure control, adjustment of the mA and/or kV according to patient size and/or use of iterative reconstruction technique. COMPARISON:  CT of the abdomen pelvis performed Jan 30, 2024 FINDINGS: Lower chest: Interval development of patchy multifocal airspace consolidation in the right middle lobe and right lower lobes. Hepatobiliary: A biliary stent is present. Small volume perihepatic ascites. Limited visualization of liver metastasis secondary to the absence of intravenous contrast agent. Pancreas: Nothing significant. Spleen: Nothing significant. Adrenals/Urinary Tract: No hydronephrosis. Vascular calcifications in both kidneys. The urinary bladder contains a small focus of air. Stomach/Bowel: The stomach is nondilated. A moderate volume of stool material is present throughout the colon. Vascular/Lymphatic: Extensive calcific atherosclerosis is present. No abdominal aortic aneurysm. Reproductive: Uterus and bilateral adnexa are unremarkable. Other: Nothing significant. Musculoskeletal: Not significantly changed. IMPRESSION: 1. Interval development of multifocal  airspace infiltrates in the right middle lobe and right lower lobe, compatible with pneumonia. 2. A moderate to large volume of stool material is present throughout the colon which may represent sequelae of constipation. 3. Similar appearance of internal biliary stent. Electronically Signed   By: Reagan Camera M.D.   On: 02/12/2024 16:27    My interpretation of Electrocardiogram: EKG shows atrial fibrillation with RVR at 144 bpm.  Left axis deviation.  Nonspecific ST and T wave changes noted which is probably rate related.   Problem List  Principal Problem:   Severe sepsis (HCC) Active Problems:   Essential hypertension   DM2 (diabetes mellitus, type 2) (HCC)   Carotid artery disease (HCC)   PAD (peripheral artery disease) (HCC)   AKI (acute kidney injury) (HCC)   Coronary artery disease involving native heart without angina pectoris   Gallbladder cancer (HCC)   Pneumonia   Transaminitis   Atrial fibrillation with RVR (HCC)   Metabolic acidosis   Assessment: This is a 83 year old female with past medical history as stated earlier who was brought in by EMS after she was found to be confused by family.  Patient has had a few episodes of vomiting in the last few days.  She is noted to have pneumonia.  She appears to have severe sepsis based on fever tachycardia leukocytosis and acute kidney injury.  Probably aspiration pneumonia.  Plan:  #1. Pneumonia most likely aspiration pneumonia with severe sepsis, present on admission: Patient also has significant lactic acidosis with initial lactic acid level of 7.8.  This is also in the setting of metformin  use at home.  Improved to 4.3 after hydration.  Case was discussed with critical care medicine by EDP who recommends admission to progressive floor.  Patient is otherwise hemodynamically stable.  Heart rate has improved.  Blood pressure stable.  Temperature has improved.  She has several episode of vomiting in the last few days this is suggestive  of aspiration.  However for now we will place her on broad-spectrum coverage with vancomycin  and cefepime.  Follow-up on blood cultures.  Will repeat lactic acid levels.  Aspiration precautions.  Speech therapy evaluation.  COVID-19, influenza and RSV PCR negative.  #2.  Acute kidney injury with metabolic acidosis: This is in the setting of nausea vomiting.  Noted to be on diuretic and ARB at home.  Also was on metformin  at home.  Will hydrate her.  Monitor urine output.  Avoid nephrotoxic agents.  Recheck BMP at this evening and follow metabolic panel on a daily basis.  #3. Diabetes mellitus type 2: Noted to be on metformin  Jardiance  and Prandin  prior to admission.  No longer takes NovoLog .  Elevated anion gap noted however this is most likely due to other issues as mentioned above rather than diabetic ketoacidosis.  Glucose level is only 132.  Since she is on Jardiance  that there could be an element of euglycemic DKA.  We will recheck her metabolic panel and then determine further course of action.  For now place her on SSI.  Anticipate her parameters will improve with hydration and treatment of the primary etiology.  #4.  Atrial fibrillation with RVR: No previous history of atrial fibrillation.  Likely triggered by sepsis.  Place her on full dose Lovenox  for now.  Monitor on telemetry.  Hydrate.  She is noted to be on beta-blocker prior to admission for her CAD.  This can resume from tomorrow morning.  If heart rate remains significantly elevated tonight then as needed doses can be administered.  Check TSH.  No recent echocardiogram noted and so 1 will be ordered.  #5.  Gallbladder cancer/transaminitis: Followed by medical oncology.  Has completed radiation treatment.  Remains on immune O treatment.  Hold these medications for now.  Her medical oncologist will be notified of this admission.  She does have a biliary stent.  Bilirubin is normal.  Elevated AST ALT probably due to sepsis.  Continue to  trend.  #6. Coronary artery disease status post CABG: His cardiac status is stable except as mentioned above.  Continue to monitor on telemetry.  #7. Peripheral artery disease: She has had angioplasties to lower extremity blood vessels previously.  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  Currently not an active issue.  She is noted to be on aspirin  which will be continued.  Was on low-dose Xarelto  which is currently being held since she will be on full dose Lovenox  for now.  #8. Constipation: CT of the abdomen pelvis suggest significant constipation.  Check TSH.  Start bowel regimen.  Probably contributed to her nausea and vomiting.  Abdomen is benign on examination.   DVT Prophylaxis: Full dose Lovenox  for now Code Status: Full code.  This was discussed with patient and her daughter Family Communication: Updated daughter at bedside Disposition: Hopefully return home when improved Consults called: None at this time Admission Status: Status is: Inpatient Remains inpatient appropriate because: Severe sepsis    Severity of Illness: The appropriate patient status for this patient is INPATIENT. Inpatient status is judged to be reasonable and necessary in order to provide the required intensity of service to ensure the patient's safety. The patient's presenting symptoms, physical exam findings, and initial radiographic and laboratory data in the context  of their chronic comorbidities is felt to place them at high risk for further clinical deterioration. Furthermore, it is not anticipated that the patient will be medically stable for discharge from the hospital within 2 midnights of admission.   * I certify that at the point of admission it is my clinical judgment that the patient will require inpatient hospital care spanning beyond 2 midnights from the point of admission due to high intensity of service, high risk for further deterioration and high frequency of surveillance  required.*   Further management decisions will depend on results of further testing and patient's response to treatment.   Matthieu Loftus  [facility] Pager on [email or web address]  02/12/2024, 5:23 PM

## 2024-02-12 NOTE — Progress Notes (Signed)
 Pharmacy Antibiotic and Anticoagulation Note  [name] is a 83 y.o. female admitted on 02/12/2024 with sepsis and pneumonia. Pharmacy has been consulted for cefepime and vancomycin  dosing. Notable AKI - Scr: 2.31, baseline around 1. Suspected due to N/V.   Plan: Cefepime 2g q24h.  Patient received 1000mg  vancomycin  load, will not schedule regimen for now pending renal recovery.  Follow culture data for de-escalation.  Monitor renal function for dose adjustments as indicated.  F/u MRSA PCR.   Height: 5' 5.5" (166.4 cm) Weight: 51.7 kg (114 lb) IBW/kg (Calculated) : 58.15  Temp (24hrs), Avg:100.8 F (38.2 C), Min:97.8 F (36.6 C), Max:103.7 F (39.8 C)  Recent Labs  Lab 02/12/24 1330 02/12/24 1342 02/12/24 1545  WBC 14.0*  --   --   CREATININE 2.31*  --   --   LATICACIDVEN  --  7.8* 4.3*    Estimated Creatinine Clearance: 15.6 mL/min (A) (by C-G formula based on SCr of 2.31 mg/dL (H)).    Allergies  Allergen Reactions   Versed  [Midazolam ] Anxiety    Frantic, out of my mind, agitated    Augmentin [Amoxicillin-Pot Clavulanate] Other (See Comments)    Elevates potassium level   Bactrim  [Sulfamethoxazole -Trimethoprim ] Other (See Comments)    ^ K+( elevated)    Demerol [Meperidine]     Delusional    Meperidine Hcl     Other reaction(s): delusional   Scopolamine     Delusional   Patch *   Tramadol  Other (See Comments)    Keeps awake    Antimicrobials this admission: Cefepime 5/21 >>  Vancomycin  5/21 >>   Microbiology results: 5/21 BCx:  5/21 Sputum:   5/21 MRSA PCR:   PHARMACY - ANTICOAGULATION CONSULT NOTE  Pharmacy Consult for enoxaparin   Indication: atrial fibrillation  Allergies  Allergen Reactions   Versed  [Midazolam ] Anxiety    Frantic, out of my mind, agitated    Augmentin [Amoxicillin-Pot Clavulanate] Other (See Comments)    Elevates potassium level   Bactrim  [Sulfamethoxazole -Trimethoprim ] Other (See Comments)    ^ K+( elevated)    Demerol  [Meperidine]     Delusional    Meperidine Hcl     Other reaction(s): delusional   Scopolamine     Delusional   Patch *   Tramadol  Other (See Comments)    Keeps awake    Patient Measurements: Height: 5' 5.5" (166.4 cm) Weight: 51.7 kg (114 lb) IBW/kg (Calculated) : 58.15 HEPARIN  DW (KG): 51.7  Vital Signs: Temp: 97.8 F (36.6 C) (05/21 1445) Temp Source: Oral (05/21 1445) BP: 159/54 (05/21 1730) Pulse Rate: 117 (05/21 1730)  Labs: Recent Labs    02/12/24 1330  HGB 12.0  HCT 35.8*  PLT 224  LABPROT 23.7*  INR 2.1*  CREATININE 2.31*    Estimated Creatinine Clearance: 15.6 mL/min (A) (by C-G formula based on SCr of 2.31 mg/dL (H)).   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 There is a small calcified mass in the L:A near the LA appendage. No valvular masses seen with associated mitral annular calcification. LA Volume/ BSA27.4 ml/m2 No AS. Right ventricular systolic pressure is elevated at .   Jaundice    Myocardial infarction [facility])    June 2021   Neuromuscular disorder (HCC)    neuropathy in bilateral feet   Peripheral vascular disease (HCC)    Pneumonia    not hosp.    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  PTCAs with drug coated balloon Lt ant tibial artery and Lt popliteal artery  11/26/2019    Assessment: Patient with new onset Afib w/ RVR. On Xarelto   2.5mg  BID PTA for PAD- last dose 5/20 PM. Hgb 12.0 and PLTs 224.   Goal of Therapy:  Anti-Xa level 0.6-1 units/ml 4hrs after LMWH dose given Monitor platelets by anticoagulation protocol: Yes   Plan:  Lovenox  50mg  q24h due to renal fxn.  Would recommend anti-Xa level after 3-4 doses due to AKI.  F/u for s/sx of bleeding // ability to transition to PO.   Thank you for allowing pharmacy to be a part of this patient's care.  Mamie Searles, PharmD, BCCCP 02/12/2024 5:43 PM

## 2024-02-12 NOTE — ED Provider Notes (Signed)
 [place] [facility] Provider Note   CSN: [id number] Arrival date & time: 02/12/24  1314     History  Chief Complaint  Patient presents with   Altered Mental Status    [name] is a 83 y.o. female.  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  Patient did have 1 episode of vomiting yesterday which family did not find all that unusual as she does have emesis intermittently.  However this morning she was supposed to go to an appointment and she never showed up.  Family came and patient was in bed.  She has been able to answer questions but is just been very lethargic.  Patient denies any pain anywhere at this time.  Unclear exactly when she started feeling unwell.  EMS reported that patient's heart rate has been 130-140 since arrival with concern for possible atrial fibrillation which is known to the patient and she does take anticoagulation for this.  Patient received 750 mL of fluid and route.  Blood pressure has been stable.  The history is provided by the EMS personnel and medical records. The history is limited by the condition of the patient.       Home Medications Prior to Admission medications   Medication Sig Start Date End Date Taking? Authorizing Provider  aspirin  EC 81 MG tablet Take 81 mg by mouth at bedtime. Swallow whole.    [provider]  brimonidine  (ALPHAGAN ) 0.2 % ophthalmic solution Place 1 drop into the right eye 2 (two) times daily. 01/19/21   [provider]  capecitabine  (XELODA ) 500 MG tablet Take 2 tablets (1000 mg total) by mouth 2 (two) times daily. Take within 30  minutes after meals. Take only on days of radiation, Monday to Friday. 12/09/23   Sonja Ely, MD  empagliflozin  (JARDIANCE ) 25 MG TABS tablet Take 1 tablet (25 mg total) by mouth daily before breakfast. 07/29/23   Thapa, [place], MD  hydrochlorothiazide  (HYDRODIURIL ) 12.5 MG tablet Take 1 tablet by mouth once daily 12/17/23   Thapa, [place], MD  Insulin  Lispro Prot & Lispro (HUMALOG  MIX 75/25 KWIKPEN) (75-25) 100 UNIT/ML Kwikpen INJECT 6 UNITS SUBCUTANEOUSLY ONCE DAILY WITH BREAKFAST 09/26/23   Thapa, [place], MD  Insulin  Pen Needle (ULTRA-THIN II MINI PEN NEEDLE) 31G X 5 MM MISC Use as directed to check blood glucose 07/29/23   Thapa, [place], MD  Lancets [facility] DELICA PLUS [place]) MISC Use to check blood sugars twice daily. Patient taking differently: Use to check blood sugars  daily. 04/24/21   Lajean Pike, MD  MAGnesium -Oxide 400 (240 Mg) MG tablet Take 1 tablet by mouth twice daily 01/22/24   Boscia, Heather E, NP  metFORMIN  (GLUCOPHAGE ) 1000 MG tablet Take 1 tablet (1,000 mg total) by mouth 2 (two) times daily. 08/21/23   Avanell Leigh, MD  metoprolol  tartrate (LOPRESSOR ) 50 MG tablet Take 1 tablet (50 mg total) by mouth 2 (two) times daily. 01/08/24   Avanell Leigh, MD  Multiple Vitamins-Minerals (MULTI FOR HER PO) Take 1 tablet by mouth daily. Patient not taking: Reported on 02/06/2024    [provider]  mupirocin  ointment (BACTROBAN ) 2 % Apply 1  Application topically 2 (two) times daily. Patient taking differently: Apply 1 Application topically 2 (two) times daily as needed (wound care for toes). 03/14/23   Charity Conch, DPM  ONETOUCH VERIO test strip USE TO CHECK BLOOD SUGARS ONCE DAILY 07/19/22   Lajean Pike, MD  Propylene Glycol (SYSTANE BALANCE) 0.6 % SOLN Place 1 drop into the left eye 3 (three) times daily as needed (dry/irritated eyes).    [provider]  repaglinide  (PRANDIN ) 1 MG tablet Take 1 tablet (1 mg total) by mouth 2 (two) times daily before a meal. Take 1  tab by breakfast and 1 tab before dinner 09/26/23   Thapa, [place], MD  rivaroxaban  (XARELTO ) 2.5 MG TABS tablet Take 1 tablet by mouth twice daily 06/20/23   Carlene Che, MD  rosuvastatin  (CRESTOR ) 40 MG tablet Take 1 tablet (40 mg total) by mouth daily. 02/06/24 05/06/24  Jude Norton, NP  telmisartan  (MICARDIS ) 80 MG tablet Take 1 tablet (80 mg total) by mouth daily. 01/08/24   Avanell Leigh, MD      Allergies    Versed  [midazolam ], Augmentin [amoxicillin-pot clavulanate], Bactrim  [sulfamethoxazole -trimethoprim ], Demerol [meperidine], Meperidine hcl, Scopolamine, and Tramadol     Review of Systems   Review of Systems  Physical Exam Updated Vital Signs BP (!) 109/47   Pulse (!) 126   Temp 97.8 F (36.6 C) (Oral)   Resp 14   Ht 5' 5.5" (1.664 m)   Wt 51.7 kg   SpO2 97%   BMI 18.68 kg/m  Physical Exam Vitals and nursing note reviewed.  Constitutional:      General: She is not in acute distress.    Appearance: She is well-developed.  HENT:     Head: Normocephalic and atraumatic.     Mouth/Throat:     Mouth: Mucous membranes are dry.  Eyes:     Conjunctiva/sclera: Conjunctivae normal.     Pupils: Pupils are equal, round, and reactive to light.  Cardiovascular:     Rate and Rhythm: Regular rhythm. Tachycardia present.     Heart sounds: No murmur heard. Pulmonary:     Effort: Pulmonary effort is normal. No respiratory distress.     Breath sounds: Normal breath sounds. No wheezing or rales.  Abdominal:     General: There is no distension.     Palpations: Abdomen is soft.     Tenderness: There is no abdominal tenderness. There is no guarding or rebound.  Musculoskeletal:        General: No tenderness. Normal range of motion.     Cervical back: Normal range of motion and neck supple.     Right lower leg: No edema.     Left lower leg: No edema.     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     Findings: No erythema  or rash.     Comments: Small decubitus ulcer noted at the coccyx that is 0.5 x 0.5 without surrounding erythema or drainage  Neurological:     Mental Status: She is oriented to person, place, and time. She is lethargic.     Comments: Generalized weakness  Psychiatric:        Behavior: Behavior normal.     ED Results / Procedures / Treatments   Labs (all labs ordered are listed, but only abnormal results are displayed) Labs Reviewed  COMPREHENSIVE METABOLIC PANEL WITH GFR - Abnormal; Notable for the following components:  Result Value   CO2 11 (*)    Glucose, Bld 132 (*)    BUN 81 (*)    Creatinine, Ser 2.31 (*)    Total Protein 5.5 (*)    Albumin  2.4 (*)    AST 264 (*)    ALT 86 (*)    GFR, Estimated 21 (*)    Anion gap 21 (*)    All other components within normal limits  CBC WITH DIFFERENTIAL/PLATELET - Abnormal; Notable for the following components:   WBC 14.0 (*)    RBC 3.41 (*)    HCT 35.8 (*)    MCV 105.0 (*)    MCH 35.2 (*)    RDW 18.0 (*)    nRBC 0.3 (*)    Neutro Abs 12.9 (*)    Lymphs Abs 0.3 (*)    Abs Immature Granulocytes 0.10 (*)    All other components within normal limits  PROTIME-INR - Abnormal; Notable for the following components:   Prothrombin Time 23.7 (*)    INR 2.1 (*)    All other components within normal limits  I-STAT CG4 LACTIC ACID, ED - Abnormal; Notable for the following components:   Lactic Acid, Venous 7.8 (*)    All other components within normal limits  RESP PANEL BY RT-PCR (RSV, FLU A&B, COVID)  RVPGX2  CULTURE, BLOOD (ROUTINE X 2)  CULTURE, BLOOD (ROUTINE X 2)  URINALYSIS, W/ REFLEX TO CULTURE (INFECTION SUSPECTED)  I-STAT CG4 LACTIC ACID, ED    EKG EKG Interpretation Date/Time:  Wednesday Feb 12 2024 13:17:58 EDT Ventricular Rate:  144 PR Interval:    QRS Duration:  114 QT Interval:  320 QTC Calculation: 496 R Axis:   -76  Text Interpretation: Atrial fibrillation with rapid V-rate Incomplete right bundle branch  block LVH with IVCD, LAD and secondary repol abnrm Inferior infarct, acute (RCA) Probable posterior infarct, acute Lateral leads are also involved Probable RV involvement, suggest recording right precordial leads Confirmed by Almond Army ([id number]) on 02/12/2024 2:00:31 PM  Radiology No results found.  Procedures Procedures    Medications Ordered in ED Medications  lactated ringers  infusion ( Intravenous New Bag/Given 02/12/24 1441)  metroNIDAZOLE  (FLAGYL ) IVPB 500 mg (500 mg Intravenous New Bag/Given 02/12/24 1445)  lactated ringers  bolus 1,000 mL (0 mLs Intravenous Stopped 02/12/24 1439)  ceFEPIme (MAXIPIME) 2 g in sodium chloride  0.9 % 100 mL IVPB (0 g Intravenous Stopped 02/12/24 1439)  vancomycin  (VANCOCIN ) IVPB 1000 mg/200 mL premix (1,000 mg Intravenous New Bag/Given 02/12/24 1403)  acetaminophen  (TYLENOL ) suppository 650 mg (650 mg Rectal Given 02/12/24 1410)    ED Course/ Medical Decision Making/ A&P                                 Medical Decision Making Amount and/or Complexity of Data Reviewed Independent Historian: EMS External Data Reviewed: notes. Labs: ordered. Decision-making details documented in ED Course. Radiology: ordered and independent interpretation performed. Decision-making details documented in ED Course. ECG/medicine tests: ordered and independent interpretation performed. Decision-making details documented in ED Course.  Risk OTC drugs. Prescription drug management. Decision regarding hospitalization.   Pt with multiple medical problems and comorbidities and presenting today with a complaint that caries a high risk for morbidity and mortality.  Here today with signs and symptoms of sepsis.  Her temperature is 103 rectal.  Blood pressure is low normal at this time but patient is also tachycardic which appears to be A-fib  RVR.  Will initially treat symptoms of fever and patient was given 30/kg of fluid as EMS had already given 750 and a total of 1750 would  be 30/kg.  She was then started on a rate.  She was covered broadly given her history.  Labs are pending.  Patient does confirm that she did not take her morning medications and she is on metoprolol .  If there is no improvement in heart rate with fluids and fever control will give rate control.  3:05 PM On repeat evaluation patient is a little more awake now mentating better now after fluids and Tylenol .  Heart rate has improved to the 120's still appears to be in atrial fibrillation.  Blood pressure remains in the low 100s.  Daughter is also present at bedside.  Patient did confirm she was a full code.  I independently interpreted patient's EKG and labs.  EKG shows atrial fibrillation with rapid rate.  CBC with leukocytosis of 14, stable hemoglobin and normal platelet count, CMP with an anion gap of 21, AST of 264, ALT of 86, normal total bilirubin and new AKI with creatinine of 2.31 with prior creatinines of 1 as well as normal sodium and potassium, lactic acid is elevated today at 7.8.  I have independently visualized and interpreted pt's images today.  Chest x-ray without acute findings.  Urine is pending and we will do a CT of the abdomen and pelvis to ensure no other underlying causes for patient's infection.  Given patient's abnormal lab work we will discuss with critical care first.  She is received 30/kg of fluid and is currently on a rate.  Critical care will come and see the patient will repeat lactate.  CT of the abdomen pelvis pending.  CRITICAL CARE Performed by: Harly Pipkins Total critical care time: 40 minutes Critical care time was exclusive of separately billable procedures and treating other patients. Critical care was necessary to treat or prevent imminent or life-threatening deterioration. Critical care was time spent personally by me on the following activities: development of treatment plan with patient and/or surrogate as well as nursing, discussions with consultants,  evaluation of patient's response to treatment, examination of patient, obtaining history from patient or surrogate, ordering and performing treatments and interventions, ordering and review of laboratory studies, ordering and review of radiographic studies, pulse oximetry and re-evaluation of patient's condition.          Final Clinical Impression(s) / ED Diagnoses Final diagnoses:  Severe sepsis (HCC)  Atrial fibrillation with RVR [facility])    Rx / DC Orders ED Discharge Orders     None         Almond Army, MD 02/12/24 [phone]

## 2024-02-12 NOTE — ED Notes (Signed)
Pt has had multiple loose bowel movements this evening.

## 2024-02-12 NOTE — Assessment & Plan Note (Deleted)
 cT4N0M0, stage III -Patient had 1 episode of abdominal pain in October 2023, and was found to have a large gallbladder stone. -She underwent laparoscopic attempted cholecystectomy but was not able to be resected due to adhesions to duodenum and colon. -She developed jaundice in June 2024, status post ERCP and stent placement in CBD.  CT and MRI scan showed a liver mass adjacent to the gallbladder fossae, and a biopsy confirmed adenocarcinoma.  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  Unfortunately this was felt to be not resectable by Dr. Leighton Punches. -I reviewed her CT chest from April 22, 2023, which showed no evidence of distant metastasis. -I recommended first-line cisplatin , gemcitabine   every 14 days and durvalumab  every 28 days.  If she responded well, will stop chemo after 4 to 6 months of treatment, and changed to maintenance therapy. She started on 05/03/23 every 2 weeks and has been tolerating well.  We have also discussed option of consolidation radiation at some point. -restaging CT 07/08/2023 showed partial response in the primary site tumor with direct invasion to liver.  No new disease or question.   -case reviewed in GI tumor board, consolidation RT is also an option. She started RT and Xeloda  on 12/09/2023 and completed on 01/15/2024 -plan to continue durvalumab 

## 2024-02-12 NOTE — Progress Notes (Signed)
 Patient reported for 4 episodes of loose stool as being treated with MiraLAX  and Senokot twice daily.  Constipation has been resolved.  Requesting for Imodium however found out that patient has history of allergic reaction to Imodium-ingredient. Holding further constipation relief in medication.  [name] Horn, MD [facility] 02/12/2024, 11:46 PM

## 2024-02-12 NOTE — ED Notes (Signed)
 Pt mentation improving greatly.  Pt interacting with staff and family.  Eyes bright.

## 2024-02-12 NOTE — ED Provider Notes (Signed)
  Physical Exam  BP 133/72   Pulse (!) 125   Temp 97.8 F (36.6 C) (Oral)   Resp (!) 23   Ht 5' 5.5" (1.664 m)   Wt 51.7 kg   SpO2 92%   BMI 18.68 kg/m   Physical Exam  Procedures  Procedures  ED Course / MDM    Medical Decision Making Amount and/or Complexity of Data Reviewed Labs: ordered. Radiology: ordered.  Risk OTC drugs. Prescription drug management. Decision regarding hospitalization.   ICU now thinks patient can be admitted to the stepdown with the hospitalist.  Aspiration pneumonia on CT.  Antibiotics have been given.  Still has A-fib with the RVR.  Lactate down to 4.3 from 7.8.  Will discuss with Hospitalist        [name] Arias, MD 02/12/24 [phone]

## 2024-02-12 NOTE — ED Triage Notes (Signed)
 Pt here via [facility] from home.  She was supposed to go to a 2 week post radiation appointment for gallbladder ca and she did not show up.  Her daughter went home and found her still in bed, altered.    PT AO X 4, but very lethargic.  Skin hot to touch.    Vs 90% on 2 L Hr 150 Cbg 122 Bp 106/46 after 750 lr

## 2024-02-12 NOTE — Sepsis Progress Note (Signed)
 Code Sepsis protocol being monitored by eLink.

## 2024-02-12 NOTE — Telephone Encounter (Signed)
 Called pt numerous times regarding appts for today.  Geralynn Knife, CMA contacted pt's daughter regarding pt's appts.  Pt's daughter stated the pt was going to drive to the apt today.  Pt's daughter stated she was going to give the pt a call regarding the appt.  Dr. Candise Chambers office continued to call pt but was unsuccessful in reaching pt for appts.

## 2024-02-13 ENCOUNTER — Ambulatory Visit: Admitting: Endocrinology

## 2024-02-13 ENCOUNTER — Inpatient Hospital Stay (HOSPITAL_COMMUNITY)

## 2024-02-13 DIAGNOSIS — J189 Pneumonia, unspecified organism: Secondary | ICD-10-CM | POA: Diagnosis not present

## 2024-02-13 DIAGNOSIS — I4891 Unspecified atrial fibrillation: Secondary | ICD-10-CM | POA: Diagnosis not present

## 2024-02-13 DIAGNOSIS — A419 Sepsis, unspecified organism: Secondary | ICD-10-CM | POA: Diagnosis not present

## 2024-02-13 DIAGNOSIS — R7401 Elevation of levels of liver transaminase levels: Secondary | ICD-10-CM

## 2024-02-13 DIAGNOSIS — R6520 Severe sepsis without septic shock: Secondary | ICD-10-CM | POA: Diagnosis not present

## 2024-02-13 DIAGNOSIS — N179 Acute kidney failure, unspecified: Secondary | ICD-10-CM | POA: Diagnosis not present

## 2024-02-13 LAB — COMPREHENSIVE METABOLIC PANEL WITH GFR
ALT: 281 U/L — ABNORMAL HIGH (ref 0–44)
AST: 639 U/L — ABNORMAL HIGH (ref 15–41)
Albumin: 2.1 g/dL — ABNORMAL LOW (ref 3.5–5.0)
Alkaline Phosphatase: 75 U/L (ref 38–126)
Anion gap: 11 (ref 5–15)
BUN: 65 mg/dL — ABNORMAL HIGH (ref 8–23)
CO2: 19 mmol/L — ABNORMAL LOW (ref 22–32)
Calcium: 9.6 mg/dL (ref 8.9–10.3)
Chloride: 106 mmol/L (ref 98–111)
Creatinine, Ser: 1.49 mg/dL — ABNORMAL HIGH (ref 0.44–1.00)
GFR, Estimated: 35 mL/min — ABNORMAL LOW (ref >60)
Glucose, Bld: 138 mg/dL — ABNORMAL HIGH (ref 70–99)
Potassium: 3.4 mmol/L — ABNORMAL LOW (ref 3.5–5.1)
Sodium: 136 mmol/L (ref 135–145)
Total Bilirubin: 1 mg/dL (ref 0.0–1.2)
Total Protein: 4.9 g/dL — ABNORMAL LOW (ref 6.5–8.1)

## 2024-02-13 LAB — HEMOGLOBIN A1C
Hgb A1c MFr Bld: 4.9 % (ref 4.8–5.6)
Mean Plasma Glucose: 93.93 mg/dL

## 2024-02-13 LAB — CBC
HCT: 34.2 % — ABNORMAL LOW (ref 36.0–46.0)
Hemoglobin: 11.3 g/dL — ABNORMAL LOW (ref 12.0–15.0)
MCH: 34.6 pg — ABNORMAL HIGH (ref 26.0–34.0)
MCHC: 33 g/dL (ref 30.0–36.0)
MCV: 104.6 fL — ABNORMAL HIGH (ref 80.0–100.0)
Platelets: 147 10*3/uL — ABNORMAL LOW (ref 150–400)
RBC: 3.27 MIL/uL — ABNORMAL LOW (ref 3.87–5.11)
RDW: 17.4 % — ABNORMAL HIGH (ref 11.5–15.5)
WBC: 17.7 10*3/uL — ABNORMAL HIGH (ref 4.0–10.5)
nRBC: 0 % (ref 0.0–0.2)

## 2024-02-13 LAB — GLUCOSE, CAPILLARY
Glucose-Capillary: 134 mg/dL — ABNORMAL HIGH (ref 70–99)
Glucose-Capillary: 140 mg/dL — ABNORMAL HIGH (ref 70–99)
Glucose-Capillary: 118 mg/dL — ABNORMAL HIGH (ref 70–99)
Glucose-Capillary: 111 mg/dL — ABNORMAL HIGH (ref 70–99)

## 2024-02-13 LAB — ECHOCARDIOGRAM COMPLETE
Area-P 1/2: 4.06 cm2
Height: 65 in
S' Lateral: 3.2 cm
Weight: 1824 [oz_av]

## 2024-02-13 LAB — LACTIC ACID, PLASMA: Lactic Acid, Venous: 1.9 mmol/L (ref 0.5–1.9)

## 2024-02-13 LAB — PROTIME-INR
INR: 2.1 — ABNORMAL HIGH (ref 0.8–1.2)
Prothrombin Time: 23.5 s — ABNORMAL HIGH (ref 11.4–15.2)

## 2024-02-13 LAB — HIV ANTIBODY (ROUTINE TESTING W REFLEX): HIV Screen 4th Generation wRfx: NONREACTIVE

## 2024-02-13 LAB — CBG MONITORING, ED: Glucose-Capillary: 119 mg/dL — ABNORMAL HIGH (ref 70–99)

## 2024-02-13 LAB — TSH: TSH: 0.112 u[IU]/mL — ABNORMAL LOW (ref 0.350–4.500)

## 2024-02-13 MED ORDER — POTASSIUM CHLORIDE CRYS ER 20 MEQ PO TBCR
40.0000 meq | EXTENDED_RELEASE_TABLET | Freq: Once | ORAL | Status: AC
  Administered 2024-02-13: 40 meq via ORAL
  Filled 2024-02-13: qty 2

## 2024-02-13 MED ORDER — LACTATED RINGERS IV SOLN: INTRAVENOUS | Status: DC

## 2024-02-13 MED ORDER — BISMUTH SUBSALICYLATE 262 MG/15ML PO SUSP
30.0000 mL | ORAL | Status: DC | PRN
  Filled 2024-02-13: qty 236

## 2024-02-13 MED ORDER — VANCOMYCIN HCL 500 MG/100ML IV SOLN
500.0000 mg | INTRAVENOUS | Status: DC
  Administered 2024-02-13: 500 mg via INTRAVENOUS
  Filled 2024-02-13 (×2): qty 100

## 2024-02-13 NOTE — H&P (Signed)
 [facility] History and Physical  [name] WUJ:[id number] DOB: September 16, 1941 DOA: 02/12/2024   PCP: [name] Byers, MD  Specialists: Dr. Katheryne [name] is her cardiologist.  She is followed by Dr. Maryalice [name] with medical oncology.  Also followed by radiation oncology.   Chief Complaint: Weak, mental confusion   HPI: [name] is a 83 y.o. female with a past medical history of essential hypertension, diabetes mellitus type 2, coronary artery disease, peripheral artery disease status post multiple interventions previously, status post CABG previously who is under the care of cancer Center for history of gallbladder cancer.  She has completed radiation treatment.  She has also continue treatment with the capecitabine .  She continues to get treatment with durvalumab .  She completed radiation treatment in April.  Her last infusion with the durvalumab  was on April 23.  Patient was supposed to have appointments today at cancer center which she missed.  Daughter was called who went to check on the patient and she found the patient lying on the bed somewhat confused.  EMS was called.  She was noted to be tachycardic by EMS.  She was brought into the hospital.  Patient somewhat distracted.  According to daughter her mentation has improved but not back to baseline yet.  She denies any pain issues.  Denies any cough.  Does mention that she has had a few episodes of vomiting in the last few days which is more than usual.  Denies any abdominal pain.  Has been constipated although her last bowel movement was within the last 24 to 48 hours.  She does not have regular bowel movement every day.   In the emergency department she was found to have tachycardia.  She was noted to be febrile.  Blood work showed acute kidney injury with metabolic acidosis.  She was found to have leukocytosis.  Imaging study showed evidence for pneumonia in the right lung.  Initial EKG showed atrial fibrillation with RVR.  Patient will be  hospitalized for further management.    Home Medications: Prior to Admission medications   Medication Sig Start Date End Date Taking? Authorizing Provider  aspirin  EC 81 MG tablet Take 81 mg by mouth at bedtime. Swallow whole.   Yes [provider]  brimonidine  (ALPHAGAN ) 0.2 % ophthalmic solution Place 1 drop into the right eye 2 (two) times daily. 01/19/21  Yes [provider]  empagliflozin  (JARDIANCE ) 25 MG TABS tablet Take 1 tablet (25 mg total) by mouth daily before breakfast. 07/29/23  Yes Thapa, [place], MD  hydrochlorothiazide  (HYDRODIURIL ) 12.5 MG tablet Take 1 tablet by mouth once daily 12/17/23  Yes Thapa, [place], MD  Insulin  Lispro Prot & Lispro (HUMALOG  MIX 75/25 KWIKPEN) (75-25) 100 UNIT/ML Kwikpen INJECT 6 UNITS SUBCUTANEOUSLY ONCE DAILY WITH BREAKFAST 09/26/23  Yes Thapa, [place], MD  MAGnesium -Oxide 400 (240 Mg) MG tablet Take 1 tablet by mouth twice daily 01/22/24  Yes Boscia, Heather E, NP  metFORMIN  (GLUCOPHAGE ) 1000 MG tablet Take 1 tablet (1,000 mg total) by mouth 2 (two) times daily. 08/21/23  Yes Avanell Leigh, MD  metoprolol  tartrate (LOPRESSOR ) 50 MG tablet Take 1 tablet (50 mg total) by mouth 2 (two) times daily. 01/08/24  Yes Avanell Leigh, MD  mupirocin  ointment (BACTROBAN ) 2 % Apply 1 Application topically 2 (two) times daily. Patient taking differently: Apply 1 Application topically 2 (two) times daily as needed (wound care for toes). 03/14/23  Yes Charity Conch, DPM  prochlorperazine  (COMPAZINE ) 10 MG tablet Take 10 mg  by mouth every 6 (six) hours as needed. 02/05/24  Yes [provider]  Propylene Glycol (SYSTANE BALANCE) 0.6 % SOLN Place 1 drop into the left eye 3 (three) times daily as needed (dry/irritated eyes).   Yes [provider]  repaglinide  (PRANDIN ) 1 MG tablet Take 1 tablet (1 mg total) by mouth 2 (two) times daily before a meal. Take 1 tab by breakfast and 1 tab before dinner 09/26/23  Yes Thapa, [place], MD  rivaroxaban   (XARELTO ) 2.5 MG TABS tablet Take 1 tablet by mouth twice daily 06/20/23  Yes Carlene Che, MD  rosuvastatin  (CRESTOR ) 40 MG tablet Take 1 tablet (40 mg total) by mouth daily. 02/06/24 05/06/24 Yes Monge, Nelva Bang, NP  telmisartan  (MICARDIS ) 80 MG tablet Take 1 tablet (80 mg total) by mouth daily. 01/08/24  Yes Avanell Leigh, MD  capecitabine  (XELODA ) 500 MG tablet Take 2 tablets (1000 mg total) by mouth 2 (two) times daily. Take within 30 minutes after meals. Take only on days of radiation, Monday to Friday. 12/09/23   Sonja Fannin, MD  Insulin  Pen Needle (ULTRA-THIN II MINI PEN NEEDLE) 31G X 5 MM MISC Use as directed to check blood glucose 07/29/23   Thapa, [place], MD  Lancets [employer] [employer] [facility] DELICA PLUS [place]) MISC Use to check blood sugars twice daily. Patient taking differently: Use to check blood sugars  daily. 04/24/21   Lajean Pike, MD  [facility] VERIO test strip USE TO CHECK BLOOD SUGARS ONCE DAILY 07/19/22   Lajean Pike, MD    Allergies:  Allergies  Allergen Reactions   Versed  [Midazolam ] Anxiety    Frantic, out of my mind, agitated    Augmentin [Amoxicillin-Pot Clavulanate] Other (See Comments)    Elevates potassium level   Bactrim  [Sulfamethoxazole -Trimethoprim ] Other (See Comments)    ^ K+( elevated)    Demerol [Meperidine]     Delusional    Meperidine Hcl     Other reaction(s): delusional   Scopolamine     Delusional   Patch *   Tramadol  Other (See Comments)    Keeps awake    Past Medical History: Past Medical History:  Diagnosis Date   Anemia    after CABG in june 2021   Arthritis    Cancer (HCC)    removal from nose - MOSE procedure    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 There is a small calcified mass in the L:A near the LA appendage. No valvular masses seen with associated mitral annular calcification. LA Volume/ BSA27.4 ml/m2 No AS. Right ventricular systolic pressure is elevated at .   Jaundice    Myocardial infarction [facility])    June 2021   Neuromuscular disorder (HCC)    neuropathy in bilateral feet   Peripheral vascular disease (HCC)    Pneumonia    not hosp.    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  PTCAs with drug coated balloon Lt ant tibial artery and Lt popliteal artery  11/26/2019    Past Surgical History:  Procedure Laterality  Date   ABDOMINAL AORTAGRAM  11/25/2019   ABDOMINAL AORTOGRAM W/LOWER EXTREMITY    ABDOMINAL AORTOGRAM N/A 06/15/2019   Procedure: ABDOMINAL AORTOGRAM;  Surgeon: Avanell Leigh, MD;  [facility];  Service: Cardiovascular;  Laterality: N/A;   ABDOMINAL AORTOGRAM W/LOWER EXTREMITY N/A 11/25/2019   Procedure: ABDOMINAL AORTOGRAM W/LOWER EXTREMITY;  Surgeon: Wenona Hamilton, MD;  [facility]: [facility];  Service: Cardiovascular;  Laterality: N/A;  Lt leg   ABDOMINAL AORTOGRAM W/LOWER EXTREMITY N/A 08/01/2020   Procedure: ABDOMINAL AORTOGRAM W/LOWER EXTREMITY;  Surgeon: Avanell Leigh, MD;  [facility];  Service: Cardiovascular;  Laterality: N/A;   ABDOMINAL AORTOGRAM W/LOWER EXTREMITY N/A 04/28/2021   Procedure: ABDOMINAL AORTOGRAM W/LOWER EXTREMITY;  Surgeon: Richrd Char, MD;  [facility];  Service: Cardiovascular;  Laterality: N/A;   ABDOMINAL AORTOGRAM W/LOWER EXTREMITY N/A 10/13/2021   Procedure: ABDOMINAL AORTOGRAM W/LOWER EXTREMITY;  Surgeon: Carlene Che, MD;  [facility];  Service: Cardiovascular;  Laterality: N/A;   ABDOMINAL AORTOGRAM W/LOWER EXTREMITY N/A 11/09/2022    Procedure: ABDOMINAL AORTOGRAM W/LOWER EXTREMITY;  Surgeon: Carlene Che, MD;  [facility];  Service: Cardiovascular;  Laterality: N/A;   ABDOMINAL AORTOGRAM W/LOWER EXTREMITY N/A 11/16/2022   Procedure: ABDOMINAL AORTOGRAM W/LOWER EXTREMITY;  Surgeon: Carlene Che, MD;  [facility];  Service: Cardiovascular;  Laterality: N/A;   AMPUTATION TOE Left 05/03/2021   Procedure: AMPUTATION OF THIRD LEFT  TOE;  Surgeon: Carlene Che, MD;  Location: [facility] OR;  Service: Vascular;  Laterality: Left;   APPENDECTOMY     APPLICATION OF WOUND VAC Right 07/21/2019   Procedure: Application Of Prevena Wound Vac Right Groin;  Surgeon: Margherita Shell, MD;  Location: [facility] OR;  Service: Vascular;  Laterality: Right;   BILIARY BRUSHING  04/07/2023   Procedure: BILIARY BRUSHING;  Surgeon: Ozell Blunt, MD;  Location: [facility] ENDOSCOPY;  Service: Gastroenterology;;   BILIARY STENT PLACEMENT  04/07/2023   Procedure: BILIARY STENT PLACEMENT;  Surgeon: Ozell Blunt, MD;  Location: [facility] ENDOSCOPY;  Service: Gastroenterology;;   CARPAL TUNNEL RELEASE Left    CARPAL TUNNEL RELEASE Right    CESAREAN SECTION     x 2   CHOLECYSTECTOMY N/A 10/12/2022   Procedure: LAPAROSCOPIC PARTIAL FENESTRATING CHOLECYSTECTOMY;  Surgeon: Lujean Sake, MD;  [facility];  Service: General;  Laterality: N/A;   COLONOSCOPY     CORONARY ARTERY BYPASS GRAFT N/A 03/24/2020   Procedure: CORONARY ARTERY BYPASS GRAFTING (CABG) using LIMA to LAD (m); RIMA to RAMUS; Endoscopic Right Greater Saphenous Vein: SVG to Diag1; SVG to PLB (right); and SVG to PL (left).;  Surgeon: Rudine Cos, MD;  [facility];  Service: Open Heart Surgery;  Laterality: N/A;  BILATERAL IMA   ENDARTERECTOMY FEMORAL Right 07/21/2019   Procedure: RIGHT ENDARTERECTOMY FEMORAL WITH PATCH ANGIOPLASTY;  Surgeon: Margherita Shell, MD;  [facility];  Service: Vascular;  Laterality: Right;   ENDARTERECTOMY FEMORAL Right 08/16/2020    Procedure: RIGHT FEMORAL ENDARTERECTOMY;  Surgeon: Adine Hoof, MD;  Location[facility] OR;  Service: Vascular;  Laterality: Right;   ENDOVEIN HARVEST OF GREATER SAPHENOUS VEIN Right 03/24/2020   Procedure: ENDOVEIN HARVEST OF GREATER SAPHENOUS VEIN;  Surgeon: Rudine Cos, MD;  [facility];  Service: Open Heart Surgery;  Laterality: Right;   ERCP N/A 04/07/2023   Procedure: ENDOSCOPIC RETROGRADE CHOLANGIOPANCREATOGRAPHY (ERCP);  Surgeon: Ozell Blunt, MD;  Location: [facility] ENDOSCOPY;  Service: Gastroenterology;  Laterality: N/A;   EYE SURGERY     cataract removal bilaterally   FEMORAL-POPLITEAL BYPASS GRAFT Right 08/16/2020   Procedure: BYPASS GRAFT FEMORAL-POPLITEAL ARTERY;  Surgeon: Adine Hoof, MD;  Location: [facility] OR;  Service: Vascular;  Laterality: Right;   FEMORAL-POPLITEAL BYPASS GRAFT Left 05/03/2021   Procedure: LEFT FEMORAL TO BELOW KNEE POPLITEAL ARTERY BYPASS GRAFTING WITH 6MMX80 PTFE GRAFT;  Surgeon: Carlene Che, MD;  [facility];  Service: Vascular;  Laterality: Left;   LEFT HEART CATH AND CORONARY ANGIOGRAPHY N/A 03/22/2020   Procedure: LEFT HEART CATH AND CORONARY ANGIOGRAPHY;  Surgeon: Swaz[place], Peter M, MD;  Location: [facility] INVASIVE CV LAB;  Service: Cardiovascular;  Laterality: N/A;   LOWER EXTREMITY ANGIOGRAPHY Bilateral 06/15/2019   Procedure: Lower Extremity Angiography;  Surgeon: Avanell Leigh, MD;  Location: [facility] INVASIVE CV LAB;  Service: Cardiovascular;  Laterality: Bilateral;   LOWER EXTREMITY ANGIOGRAPHY Right 08/03/2019   Procedure: LOWER EXTREMITY ANGIOGRAPHY;  Surgeon: Avanell Leigh, MD;  [facility];  Service: Cardiovascular;  Laterality: Right;   LOWER EXTREMITY ANGIOGRAPHY N/A 09/07/2021   Procedure: LOWER EXTREMITY ANGIOGRAPHY;  Surgeon: Young Hensen, MD;  [facility];  Service: Cardiovascular;  Laterality: N/A;   PATCH ANGIOPLASTY Right 07/21/2019   Procedure: Patch Angioplasty Right  Femoral Artery;  Surgeon: Margherita Shell, MD;  Location: [facility] OR;  Service: Vascular;  Laterality: Right;   PERIPHERAL INTRAVASCULAR LITHOTRIPSY  11/25/2019   Procedure: INTRAVASCULAR LITHOTRIPSY;  Surgeon: Wenona Hamilton, MD;  [facility];  Service: Cardiovascular;;  LT. SFA   PERIPHERAL VASCULAR ATHERECTOMY  08/03/2019   Procedure: PERIPHERAL VASCULAR ATHERECTOMY;  Surgeon: Avanell Leigh, MD;  Location: [facility] INVASIVE CV LAB;  Service: Cardiovascular;;  right SFA, right TP trunk   PERIPHERAL VASCULAR ATHERECTOMY  11/25/2019   Procedure: PERIPHERAL VASCULAR ATHERECTOMY;  Surgeon: Wenona Hamilton, MD;  [facility];  Service: Cardiovascular;;  Lt.  POPLITEAL and AT   PERIPHERAL VASCULAR BALLOON ANGIOPLASTY Left 06/15/2019   Procedure: PERIPHERAL VASCULAR BALLOON ANGIOPLASTY;  Surgeon: Avanell Leigh, MD;  [facility];  Service: Cardiovascular;  Laterality: Left;  SFA UNSUCCESSFUL UNABLE TO CROSS LESION   PERIPHERAL VASCULAR BALLOON ANGIOPLASTY  08/03/2019   Procedure: PERIPHERAL VASCULAR BALLOON ANGIOPLASTY;  Surgeon: Avanell Leigh, MD;  [facility];  Service: Cardiovascular;;  right SFA, Right TP trunk   PERIPHERAL VASCULAR BALLOON ANGIOPLASTY  09/07/2021   Procedure: PERIPHERAL VASCULAR BALLOON ANGIOPLASTY;  Surgeon: Young Hensen, MD;  [facility];  Service: Cardiovascular;;   PERIPHERAL VASCULAR BALLOON ANGIOPLASTY Right 10/13/2021   Procedure: PERIPHERAL VASCULAR BALLOON ANGIOPLASTY;  Surgeon: Carlene Che, MD;  [facility];  Service: Cardiovascular;  Laterality: Right;   PERIPHERAL VASCULAR BALLOON ANGIOPLASTY  11/09/2022   Procedure: PERIPHERAL VASCULAR BALLOON ANGIOPLASTY;  Surgeon: Carlene Che, MD;  [facility];  Service: Cardiovascular;;  fem-pop bypass and AT   PORTACATH PLACEMENT Left 04/29/2023   Procedure: INSERTION PORT-A-CATH;  Surgeon:  Lujean Sake, MD;  Location: [facility] ORS;  Service: General;  Laterality: Left;   PORTACATH PLACEMENT N/A 07/12/2023   Procedure: REVISION PORT-A-CATH;  Surgeon: Lujean Sake, MD;  Location: [facility] OR;  Service: General;  Laterality: N/A;   REMOVAL OF STONES  04/07/2023   Procedure: REMOVAL OF STONES;  Surgeon: Ozell Blunt, MD;  Location: [facility] ENDOSCOPY;  Service: Gastroenterology;;  SPHINCTEROTOMY  04/07/2023   Procedure: SPHINCTEROTOMY;  Surgeon: Ozell Blunt, MD;  Location: [facility] ENDOSCOPY;  Service: Gastroenterology;;   TEE WITHOUT CARDIOVERSION N/A 03/24/2020   Procedure: TRANSESOPHAGEAL ECHOCARDIOGRAM (TEE);  Surgeon: Rudine Cos, MD;  Location: [facility] OR;  Service: Open Heart Surgery;  Laterality: N/A;   TONSILLECTOMY     and adenoidectomy   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/2019   Procedure: TRANSMETATARSAL AMPUTATION;  Surgeon: Charity Conch, DPM;  [facility] OR;  Service: Podiatry;  Laterality: Right;   TRANSMETATARSAL AMPUTATION Left 05/11/2021   Procedure: LEFT TRANSMETATARSAL AMPUTATION;  Surgeon: Adine Hoof, MD;  Location: [facility] OR;  Service: Vascular;  Laterality: Left;   TUBAL LIGATION      Social History: Lives in [place].  Her daughter stays with her.  No current history of smoking alcohol  use or illicit drug usage.  She does have a living will.  She is a full code.  Ambulates using a cane or a walker.   Family History:  Family History  Problem Relation Age of Onset   Cancer - Prostate Father    Cancer - Colon Father    Stroke Mother    Hypertension Mother    Hyperlipidemia Mother    Melanoma Brother     Review of Systems - History obtained from the patient General ROS: positive for  - fatigue and fever Psychological ROS: negative Ophthalmic ROS: negative ENT ROS: negative Allergy and Immunology ROS: negative Hematological and Lymphatic ROS: negative Endocrine ROS: negative Respiratory ROS: no cough, shortness of breath, or  wheezing Cardiovascular ROS: no chest pain or dyspnea on exertion Gastrointestinal ROS: no abdominal pain, change in bowel habits, or black or bloody stools Genito-Urinary ROS: no dysuria, trouble voiding, or hematuria Musculoskeletal ROS: negative Neurological ROS: no TIA or stroke symptoms Dermatological ROS: negative  Physical Examination  BP 133/72   Pulse (!) 125   Temp 97.8 F (36.6 C) (Oral)   Resp (!) 23   Ht 5' 5.5" (1.664 m)   Wt 51.7 kg   SpO2 92%   BMI 18.68 kg/m    General appearance: alert, cooperative, appears stated age, distracted, and no distress Head: Normocephalic, without obvious abnormality, atraumatic Eyes: conjunctivae/corneas clear. PERRL, EOM's intact. Throat: lips, mucosa, and tongue normal; teeth and gums normal Back: symmetric, no curvature. ROM normal. No CVA tenderness. Resp: Normal effort at rest.  Crackles heard right base with rhonchi.  No wheezing appreciated. Cardio: S1-S2 is tachycardic regular.  No S3-S4.  No rubs murmurs or bruit. GI: soft, non-tender; bowel sounds normal; no masses,  no organomegaly Extremities: extremities normal, atraumatic, no cyanosis or edema Pulses: 2+ and symmetric Skin: Skin color, texture, turgor normal. No rashes or lesions Lymph nodes: Cervical, supraclavicular, and axillary nodes normal. Neurologic: Awake alert.  Oriented to city person.  Knew the year but did not know the month.  No obvious focal neurological deficits noted.     Labs on Admission: I have personally reviewed following labs and imaging studies  CBC: Recent Labs  Lab 02/12/24 1330   WBC 14.0*   NEUTROABS 12.9*   HGB 12.0   HCT 35.8*   MCV 105.0*   PLT 224    Basic Metabolic Panel: Recent Labs  Lab 02/12/24 1330    NA 137    K 4.0    CL 105    CO2 11*    GLUCOSE 132*    BUN 81*    CREATININE 2.31*    CALCIUM  10.2  GFR: Estimated Creatinine Clearance: 24.2 mL/min (A) (by C-G formula based on SCr of 1.49 mg/dL  (H)). Liver Function Tests: Recent Labs  Lab 02/12/24 1330   AST 264*   ALT 86*   ALKPHOS 84   BILITOT 0.9   PROT 5.5*   ALBUMIN  2.4*     Coagulation Profile: Recent Labs  Lab 02/12/24 1330   INR 2.1*     Radiological Exams on Admission: DG Chest Port 1 View Result Date: 02/12/2024 CLINICAL DATA:  Questionable sepsis. EXAM: PORTABLE CHEST 1 VIEW COMPARISON:  Chest radiograph dated 07/12/2023 FINDINGS: Similar positioning of the left Port-A-Cath. There is diffuse interstitial coarsening. Faint bibasilar densities, likely atelectasis. Developing infiltrate in the right infrahilar region is not excluded. No pleural effusion or pneumothorax the cardiac silhouette. Median sternotomy wires and CABG vascular clips. No acute osseous pathology. IMPRESSION: Bibasilar atelectasis. Developing infiltrate in the right infrahilar region is not excluded. Electronically Signed   By: Angus Bark M.D.   On: 02/12/2024 18:31   CT ABDOMEN PELVIS WO CONTRAST Result Date: 02/12/2024 CLINICAL DATA:  Sepsis EXAM: CT ABDOMEN AND PELVIS WITHOUT CONTRAST TECHNIQUE: Multidetector CT imaging of the abdomen and pelvis was performed following the standard protocol without IV contrast. RADIATION DOSE REDUCTION: This exam was performed according to the departmental dose-optimization program which includes automated exposure control, adjustment of the mA and/or kV according to patient size and/or use of iterative reconstruction technique. COMPARISON:  CT of the abdomen pelvis performed Jan 30, 2024 FINDINGS: Lower chest: Interval development of patchy multifocal airspace consolidation in the right middle lobe and right lower lobes. Hepatobiliary: A biliary stent is present. Small volume perihepatic ascites. Limited visualization of liver metastasis secondary to the absence of intravenous contrast agent. Pancreas: Nothing significant. Spleen: Nothing significant. Adrenals/Urinary Tract: No hydronephrosis. Vascular  calcifications in both kidneys. The urinary bladder contains a small focus of air. Stomach/Bowel: The stomach is nondilated. A moderate volume of stool material is present throughout the colon. Vascular/Lymphatic: Extensive calcific atherosclerosis is present. No abdominal aortic aneurysm. Reproductive: Uterus and bilateral adnexa are unremarkable. Other: Nothing significant. Musculoskeletal: Not significantly changed. IMPRESSION: 1. Interval development of multifocal airspace infiltrates in the right middle lobe and right lower lobe, compatible with pneumonia. 2. A moderate to large volume of stool material is present throughout the colon which may represent sequelae of constipation. 3. Similar appearance of internal biliary stent. Electronically Signed   By: Reagan Camera M.D.   On: 02/12/2024 16:27    My interpretation of Electrocardiogram: EKG shows atrial fibrillation with RVR at 144 bpm. Left axis deviation. Nonspecific ST and T wave changes noted which is probably rate related.    Problem List  Principal Problem:   Severe sepsis (HCC) Active Problems:   Essential hypertension   DM2 (diabetes mellitus, type 2) (HCC)   Carotid artery disease (HCC)   PAD (peripheral artery disease) (HCC)   AKI (acute kidney injury) (HCC)   Coronary artery disease involving native heart without angina pectoris   Gallbladder cancer (HCC)   Pneumonia   Transaminitis   Atrial fibrillation with RVR (HCC)   Metabolic acidosis   Assessment: This is a 83 year old female with past medical history as stated earlier who was brought in by EMS after she was found to be confused by family.  Patient has had a few episodes of vomiting in the last few days.  She is noted to have pneumonia.  She appears to have severe sepsis based on fever tachycardia leukocytosis and acute  kidney injury.  Probably aspiration pneumonia.   Plan:   #1. Pneumonia most likely aspiration pneumonia with severe sepsis, present on admission:  Patient also has significant lactic acidosis with initial lactic acid level of 7.8.  This is also in the setting of metformin  use at home.  Improved to 4.3 after hydration.  Case was discussed with critical care medicine by EDP who recommends admission to progressive floor.  Patient is otherwise hemodynamically stable.  Heart rate has improved.  Blood pressure stable.  Temperature has improved.  She has several episode of vomiting in the last few days this is suggestive of aspiration.  However for now we will place her on broad-spectrum coverage with vancomycin  and cefepime.  Follow-up on blood cultures.  Will repeat lactic acid levels.  Aspiration precautions.  Speech therapy evaluation.  COVID-19, influenza and RSV PCR negative.   #2.  Acute kidney injury with metabolic acidosis: This is in the setting of nausea vomiting.  Noted to be on diuretic and ARB at home.  Also was on metformin  at home.  Will hydrate her.  Monitor urine output.  Avoid nephrotoxic agents.  Recheck BMP at this evening and follow metabolic panel on a daily basis.   #3. Diabetes mellitus type 2: Noted to be on metformin  Jardiance  and Prandin  prior to admission.  No longer takes NovoLog .  Elevated anion gap noted however this is most likely due to other issues as mentioned above rather than diabetic ketoacidosis.  Glucose level is only 132.  Since she is on Jardiance  that there could be an element of euglycemic DKA.  We will recheck her metabolic panel and then determine further course of action.  For now place her on SSI.  Anticipate her parameters will improve with hydration and treatment of the primary etiology.   #4.  Atrial fibrillation with RVR: No previous history of atrial fibrillation.  Likely triggered by sepsis.  Place her on full dose Lovenox  for now.  Monitor on telemetry.  Hydrate.  She is noted to be on beta-blocker prior to admission for her CAD.  This can resume from tomorrow morning.  If heart rate remains significantly  elevated tonight then as needed doses can be administered.  Check TSH.  No recent echocardiogram noted and so 1 will be ordered.   #5.  Gallbladder cancer/transaminitis: Followed by medical oncology.  Has completed radiation treatment.  Remains on immune O treatment.  Hold these medications for now.  Her medical oncologist will be notified of this admission.  She does have a biliary stent.  Bilirubin is normal.  Elevated AST ALT probably due to sepsis.  Continue to trend.   #6. Coronary artery disease status post CABG: His cardiac status is stable except as mentioned above.  Continue to monitor on telemetry.   #7. Peripheral artery disease: She has had angioplasties to lower extremity blood vessels previously.  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  Currently not an active issue.  She is noted to be on aspirin  which will be continued.  Was on low-dose Xarelto  which is currently being held since she will be on full dose Lovenox  for now.   #8. Constipation: CT of the abdomen pelvis suggest significant constipation.  Check TSH.  Start bowel regimen.  Probably contributed to her nausea and vomiting.  Abdomen is benign on examination.     DVT Prophylaxis: Full dose Lovenox  for now Code Status: Full code.  This was discussed with patient and her daughter Family Communication: Updated  daughter at bedside Disposition: Hopefully return home when improved Consults called: None at this time Admission Status: Status is: Inpatient Remains inpatient appropriate because: Severe sepsis       Severity of Illness: The appropriate patient status for this patient is INPATIENT. Inpatient status is judged to be reasonable and necessary in order to provide the required intensity of service to ensure the patient's safety. The patient's presenting symptoms, physical exam findings, and initial radiographic and laboratory data in the context of their chronic comorbidities is felt to place them at high risk for  further clinical deterioration. Furthermore, it is not anticipated that the patient will be medically stable for discharge from the hospital within 2 midnights of admission.    * I certify that at the point of admission it is my clinical judgment that the patient will require inpatient hospital care spanning beyond 2 midnights from the point of admission due to high intensity of service, high risk for further deterioration and high frequency of surveillance required.*     Further management decisions will depend on results of further testing and patient's response to treatment.     Phinley Schall  [facility] Pager on [email or web address]

## 2024-02-13 NOTE — Evaluation (Signed)
 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 There is a small calcified mass in the L:A near the LA appendage. No valvular masses seen with associated mitral annular calcification. LA Volume/ BSA27.4 ml/m2 No AS. Right ventricular systolic pressure is elevated at .   Jaundice    Myocardial infarction [facility])    June 2021   Neuromuscular disorder (HCC)    neuropathy in bilateral feet   Peripheral vascular disease (HCC)    Pneumonia    not hosp.    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  PTCAs with drug coated balloon Lt ant tibial artery and Lt popliteal artery  11/26/2019   Past Surgical History:  Past Surgical History:  Procedure Laterality Date   ABDOMINAL AORTAGRAM  11/25/2019   ABDOMINAL AORTOGRAM W/LOWER EXTREMITY    ABDOMINAL AORTOGRAM N/A 06/15/2019   Procedure: ABDOMINAL AORTOGRAM;  Surgeon: Avanell Leigh, MD;  [facility];  Service: Cardiovascular;  Laterality: N/A;   ABDOMINAL AORTOGRAM W/LOWER EXTREMITY N/A 11/25/2019   Procedure: ABDOMINAL AORTOGRAM W/LOWER EXTREMITY;  Surgeon: Wenona Hamilton, MD;  [facility]: [facility];  Service: Cardiovascular;  Laterality: N/A;  Lt leg   ABDOMINAL AORTOGRAM W/LOWER EXTREMITY N/A 08/01/2020   Procedure: ABDOMINAL AORTOGRAM W/LOWER EXTREMITY;  Surgeon: Avanell Leigh, MD;  [facility];  Service: Cardiovascular;  Laterality: N/A;   ABDOMINAL AORTOGRAM W/LOWER EXTREMITY N/A 04/28/2021   Procedure: ABDOMINAL AORTOGRAM W/LOWER EXTREMITY;  Surgeon: Richrd Char, MD;  [facility];  Service: Cardiovascular;  Laterality: N/A;   ABDOMINAL AORTOGRAM W/LOWER EXTREMITY N/A 10/13/2021   Procedure: ABDOMINAL AORTOGRAM W/LOWER EXTREMITY;  Surgeon: Carlene Che, MD;  [facility];  Service: Cardiovascular;  Laterality: N/A;   ABDOMINAL AORTOGRAM W/LOWER EXTREMITY N/A 11/09/2022   Procedure: ABDOMINAL AORTOGRAM W/LOWER EXTREMITY;  Surgeon: Carlene Che, MD;  [facility];  Service: Cardiovascular;  Laterality: N/A;   ABDOMINAL AORTOGRAM W/LOWER EXTREMITY N/A 11/16/2022   Procedure: ABDOMINAL AORTOGRAM W/LOWER EXTREMITY;  Surgeon: Carlene Che, MD;  [facility];  Service: Cardiovascular;  Laterality: N/A;   AMPUTATION TOE Left 05/03/2021   Procedure: AMPUTATION OF THIRD LEFT  TOE;  Surgeon: Carlene Che, MD;  Location: [facility] OR;  Service: Vascular;  Laterality: Left;   APPENDECTOMY     APPLICATION OF WOUND VAC Right 07/21/2019   Procedure: Application Of Prevena Wound Vac Right Groin;  Surgeon: Margherita Shell, MD;  Location: [facility] OR;  Service: Vascular;  Laterality:  Right;   BILIARY BRUSHING  04/07/2023   Procedure: BILIARY BRUSHING;  Surgeon: Ozell Blunt, MD;  Location: [facility] ENDOSCOPY;  Service: Gastroenterology;;   BILIARY STENT PLACEMENT  04/07/2023   Procedure: BILIARY STENT PLACEMENT;  Surgeon: Ozell Blunt, MD;  Location: [facility] ENDOSCOPY;  Service: Gastroenterology;;   CARPAL TUNNEL RELEASE Left    CARPAL TUNNEL RELEASE Right    CESAREAN SECTION      x 2   CHOLECYSTECTOMY N/A 10/12/2022   Procedure: LAPAROSCOPIC PARTIAL FENESTRATING CHOLECYSTECTOMY;  Surgeon: Lujean Sake, MD;  [facility];  Service: General;  Laterality: N/A;   COLONOSCOPY     CORONARY ARTERY BYPASS GRAFT N/A 03/24/2020   Procedure: CORONARY ARTERY BYPASS GRAFTING (CABG) using LIMA to LAD (m); RIMA to RAMUS; Endoscopic Right Greater Saphenous Vein: SVG to Diag1; SVG to PLB (right); and SVG to PL (left).;  Surgeon: Rudine Cos, MD;  [facility];  Service: Open Heart Surgery;  Laterality: N/A;  BILATERAL IMA   ENDARTERECTOMY FEMORAL Right 07/21/2019   Procedure: RIGHT ENDARTERECTOMY FEMORAL WITH PATCH ANGIOPLASTY;  Surgeon: Margherita Shell, MD;  [facility];  Service: Vascular;  Laterality: Right;   ENDARTERECTOMY FEMORAL Right 08/16/2020   Procedure: RIGHT FEMORAL ENDARTERECTOMY;  Surgeon: Adine Hoof, MD;  [facility] OR;  Service: Vascular;  Laterality: Right;   ENDOVEIN HARVEST OF GREATER SAPHENOUS VEIN Right 03/24/2020   Procedure: ENDOVEIN HARVEST OF GREATER SAPHENOUS VEIN;  Surgeon: Rudine Cos, MD;  [facility];  Service: Open Heart Surgery;  Laterality: Right;   ERCP N/A 04/07/2023   Procedure: ENDOSCOPIC RETROGRADE CHOLANGIOPANCREATOGRAPHY (ERCP);  Surgeon: Ozell Blunt, MD;  Location: [facility] ENDOSCOPY;  Service: Gastroenterology;  Laterality: N/A;   EYE SURGERY     cataract removal bilaterally   FEMORAL-POPLITEAL BYPASS GRAFT Right 08/16/2020   Procedure: BYPASS GRAFT FEMORAL-POPLITEAL ARTERY;  Surgeon: Adine Hoof, MD;  Location: [facility] OR;  Service: Vascular;  Laterality: Right;   FEMORAL-POPLITEAL BYPASS GRAFT Left 05/03/2021   Procedure: LEFT FEMORAL TO BELOW KNEE POPLITEAL ARTERY BYPASS GRAFTING WITH 6MMX80 PTFE GRAFT;  Surgeon: Carlene Che, MD;  [facility];  Service: Vascular;  Laterality: Left;   LEFT HEART CATH AND CORONARY ANGIOGRAPHY N/A 03/22/2020   Procedure: LEFT HEART CATH AND CORONARY  ANGIOGRAPHY;  Surgeon: Swazil[place], Peter M, MD;  Location: [facility] INVASIVE CV LAB;  Service: Cardiovascular;  Laterality: N/A;   LOWER EXTREMITY ANGIOGRAPHY Bilateral 06/15/2019   Procedure: Lower Extremity Angiography;  Surgeon: Avanell Leigh, MD;  Location: [facility] INVASIVE CV LAB;  Service: Cardiovascular;  Laterality: Bilateral;   LOWER EXTREMITY ANGIOGRAPHY Right 08/03/2019   Procedure: LOWER EXTREMITY ANGIOGRAPHY;  Surgeon: Avanell Leigh, MD;  [facility];  Service: Cardiovascular;  Laterality: Right;   LOWER EXTREMITY ANGIOGRAPHY N/A 09/07/2021   Procedure: LOWER EXTREMITY ANGIOGRAPHY;  Surgeon: Young Hensen, MD;  [facility];  Service: Cardiovascular;  Laterality: N/A;   PATCH ANGIOPLASTY Right 07/21/2019   Procedure: Patch Angioplasty Right Femoral Artery;  Surgeon: Margherita Shell, MD;  Location: [facility] OR;  Service: Vascular;  Laterality: Right;   PERIPHERAL INTRAVASCULAR LITHOTRIPSY  11/25/2019   Procedure: INTRAVASCULAR LITHOTRIPSY;  Surgeon: Wenona Hamilton, MD;  [facility];  Service: Cardiovascular;;  LT. SFA   PERIPHERAL VASCULAR ATHERECTOMY  08/03/2019   Procedure: PERIPHERAL VASCULAR ATHERECTOMY;  Surgeon: Avanell Leigh, MD;  Location: [facility] INVASIVE CV LAB;  Service: Cardiovascular;;  right SFA, right TP trunk   PERIPHERAL  VASCULAR ATHERECTOMY  11/25/2019   Procedure: PERIPHERAL VASCULAR ATHERECTOMY;  Surgeon: Wenona Hamilton, MD;  [facility];  Service: Cardiovascular;;  Lt.  POPLITEAL and AT   PERIPHERAL VASCULAR BALLOON ANGIOPLASTY Left 06/15/2019   Procedure: PERIPHERAL VASCULAR BALLOON ANGIOPLASTY;  Surgeon: Avanell Leigh, MD;  [facility];  Service: Cardiovascular;  Laterality: Left;  SFA UNSUCCESSFUL UNABLE TO CROSS LESION   PERIPHERAL VASCULAR BALLOON ANGIOPLASTY  08/03/2019   Procedure: PERIPHERAL VASCULAR BALLOON ANGIOPLASTY;  Surgeon: Avanell Leigh, MD;  [facility];  Service: Cardiovascular;;  right SFA, Right TP trunk   PERIPHERAL VASCULAR BALLOON ANGIOPLASTY  09/07/2021   Procedure: PERIPHERAL VASCULAR BALLOON ANGIOPLASTY;  Surgeon: Young Hensen, MD;  [facility];  Service: Cardiovascular;;   PERIPHERAL VASCULAR BALLOON ANGIOPLASTY Right 10/13/2021   Procedure: PERIPHERAL VASCULAR BALLOON ANGIOPLASTY;  Surgeon: Carlene Che, MD;  [facility];  Service: Cardiovascular;  Laterality: Right;   PERIPHERAL VASCULAR BALLOON ANGIOPLASTY  11/09/2022   Procedure: PERIPHERAL VASCULAR BALLOON ANGIOPLASTY;  Surgeon: Carlene Che, MD;  [facility];  Service: Cardiovascular;;  fem-pop bypass and AT   PORTACATH PLACEMENT Left 04/29/2023   Procedure: INSERTION PORT-A-CATH;  Surgeon: Lujean Sake, MD;  Location: [facility] ORS;  Service: General;  Laterality: Left;   PORTACATH PLACEMENT N/A 07/12/2023   Procedure: REVISION PORT-A-CATH;  Surgeon: Lujean Sake, MD;  Location: [facility] OR;  Service: General;  Laterality: N/A;   REMOVAL OF STONES  04/07/2023   Procedure: REMOVAL OF STONES;  Surgeon: Ozell Blunt, MD;  Location: [facility] ENDOSCOPY;  Service: Gastroenterology;;   [address]  04/07/2023   Procedure: [address];  Surgeon: Ozell Blunt, MD;  Location: [facility] ENDOSCOPY;  Service: Gastroenterology;;   TEE WITHOUT CARDIOVERSION N/A 03/24/2020   Procedure: TRANSESOPHAGEAL ECHOCARDIOGRAM (TEE);  Surgeon: Rudine Cos, MD;  Location: [facility] OR;  Service: Open Heart Surgery;  Laterality: N/A;   TONSILLECTOMY     and adenoidectomy   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/2019   Procedure: TRANSMETATARSAL AMPUTATION;  Surgeon: Charity Conch, DPM;  [facility] OR;  Service: Podiatry;  Laterality: Right;   TRANSMETATARSAL AMPUTATION Left 05/11/2021   Procedure: LEFT TRANSMETATARSAL AMPUTATION;  Surgeon: Adine Hoof, MD;  Location: [facility] OR;  Service: Vascular;  Laterality: Left;   TUBAL  LIGATION     HPI:  This is a 83 year old female with past medical history as stated earlier who was brought in by EMS after she was found to be confused by family.  Patient has had a few episodes of vomiting in the last few days.  She is noted to have pneumonia.  She appears to have severe sepsis based on fever tachycardia leukocytosis and acute kidney injury.  Probably aspiration pneumonia.    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 Pt reports recent inability to tolerate many solids "they just wont go down." Pt was able to masticate fruit and a soaked saltine, followed by sips of liquid. Her problem seems to be lack of saliva, which she manages with increased fluids. Her swallowing ability is [facility], though SLP was able to given some suggestion to reduce risk of aspiration of reflux of vomit at night and how to incraese nutrition with a mostly liquid diet. No SLP f/u needed, education complete. SLP Visit Diagnosis: Dysphagia, oral phase (R13.11)    Aspiration Risk  Risk for inadequate nutrition/hydration  Diet Recommendation Regular;Thin liquid    Liquid Administration via: Cup;Straw Medication Administration: Whole meds with liquid Supervision: Patient able to self feed    Other  Recommendations      Recommendations for follow up therapy are one component of a multi-disciplinary discharge planning process, led by the attending physician.  Recommendations may be updated based on patient status, additional functional criteria and insurance authorization.  Follow up Recommendations        Assistance Recommended at Discharge    Functional Status Assessment    Frequency and Duration            Prognosis        Swallow Study   General HPI: This is a 83 year old female with past medical history as stated earlier who was brought in by EMS after she was found to be confused by family.  Patient has had a few  episodes of vomiting in the last few days.  She is noted to have pneumonia.  She appears to have severe sepsis based on fever tachycardia leukocytosis and acute kidney injury.  Probably aspiration pneumonia. Type of Study: Bedside Swallow Evaluation Previous Swallow Assessment: none Diet Prior to this Study: Other (Comment) (soft/thin) Respiratory Status: Room air History of Recent Intubation: No Behavior/Cognition: Alert;Cooperative;Pleasant mood Oral Cavity Assessment: Within Functional Limits Oral Care Completed by SLP: No Oral Cavity - Dentition: Adequate natural dentition Vision: Functional for self-feeding Self-Feeding Abilities: Able to feed self Patient Positioning: Upright in bed Baseline Vocal Quality: Normal Volitional Cough: Strong Volitional Swallow: Able to elicit    Oral/Motor/Sensory Function Overall Oral Motor/Sensory Function: Within functional limits   Ice Chips     Thin Liquid Thin Liquid: Within functional limits    Nectar Thick Nectar Thick Liquid: Not tested   Honey Thick Honey Thick Liquid: Not tested   Puree Puree: Within functional limits   Solid     Solid: Within functional limits      Ching Rabideau, Hardin Leys 02/13/2024,3:00 PM

## 2024-02-13 NOTE — Progress Notes (Signed)
 Pharmacy Antibiotic and Anticoagulation Note  [name] is a 83 y.o. female admitted on 02/12/2024 with sepsis and pneumonia. Pharmacy has been consulted for cefepime and vancomycin  dosing.   AKI improving - SCr down to 1.49, afebrile, WBC 17.7  Plan: Vanc 500mg  IV Q24H for AUC 543 using SCr 1.49 and TBW Cefepime 2g IV Q24H Monitor renal fxn, micro data, vanc levels if indicated  Height: 5\' 5"  (165.1 cm) Weight: 51.7 kg (114 lb) IBW/kg (Calculated) : 57  Temp (24hrs), Avg:98.8 F (37.1 C), Min:97.4 F (36.3 C), Max:103.7 F (39.8 C)  Recent Labs  Lab 02/12/24 1330 02/12/24 1342 02/12/24 1545 02/12/24 1838 02/13/24 0608  WBC 14.0*  --   --   --  17.7*  CREATININE 2.31*  --   --  1.93* 1.49*  LATICACIDVEN  --  7.8* 4.3* 2.9* 1.9    Estimated Creatinine Clearance: 24.2 mL/min (A) (by C-G formula based on SCr of 1.49 mg/dL (H)).    Allergies  Allergen Reactions   Versed  [Midazolam ] Anxiety    Frantic, out of my mind, agitated    Augmentin [Amoxicillin-Pot Clavulanate] Other (See Comments)    Elevates potassium level   Bactrim  [Sulfamethoxazole -Trimethoprim ] Other (See Comments)    ^ K+( elevated)    Demerol [Meperidine]     Delusional    Meperidine Hcl     Other reaction(s): delusional   Scopolamine     Delusional   Patch *   Tramadol  Other (See Comments)    Keeps awake    Recent Labs    02/12/24 1330 02/12/24 1838 02/13/24 0608  HGB 12.0  --  11.3*  HCT 35.8*  --  34.2*  PLT 224  --  147*  LABPROT 23.7*  --  23.5*  INR 2.1*  --  2.1*  CREATININE 2.31* 1.93* 1.49*    Estimated Creatinine Clearance: 24.2 mL/min (A) (by C-G formula based on SCr of 1.49 mg/dL (H)).   Vanc 5/21 >> Cefepime 5/21 >>   5/21 BCx -  5/21 MRSA PCR - 5/21 UCx -   Yariah Selvey D. Marikay Show, PharmD, BCPS, BCCCP 02/13/2024, 1:21 PM

## 2024-02-13 NOTE — Progress Notes (Signed)
 [facility] PROGRESS NOTE   [name] WUJ:[id number] DOB: October 13, 1940 DOA: 02/12/2024  PCP: Ransom Byers, MD  Brief History: 83 y.o. female with a past medical history of essential hypertension, diabetes mellitus type 2, coronary artery disease, peripheral artery disease status post multiple interventions previously, status post CABG previously who is under the care of cancer Center for history of gallbladder cancer.  She has completed radiation treatment.  She has also continue treatment with the capecitabine .  She continues to get treatment with durvalumab .  She completed radiation treatment in April.  Her last infusion with the durvalumab  was on April 23.  Patient missed her appointments at the cancer center.  When daughter went to check on her she was lying on the bed and was noted to be confused.  She was brought into the hospital.  She was noted to be febrile.  Noted to have pneumonia.  She was hospitalized for further management.     Consultants: None  Procedures: None    Subjective/Interval History: Patient mentions that she feels better this morning.  Was able to sleep some overnight.  Denies any shortness of breath or chest pain.  No nausea or vomiting overnight.  Her son is at the bedside.    Assessment/Plan:   Pneumonia most likely aspiration pneumonia with severe sepsis, present on admission Patient was febrile in the ED with elevated WBC.  She also had lactic acidosis with initial level of 7.8.  Noted to be normal at 1.9 this morning. COVID-19 influenza and RSV PCR negative.  CT suggested right lung pneumonia.  Patient likely aspirated as she mentioned episodes of nausea and vomiting over the last few days prior to admission. Patient was quite tachycardic at admission.  She met criteria for severe sepsis due to AKI. She appears to have improved.  Noted to be saturating 100% on 4 L.  Should be able to wean down her oxygen . WBC is noted to be slightly  higher today.  Heart rate has improved.  Blood pressure is stable. Continue with broad-spectrum antibiotics for now.   Follow-up on blood cultures.     Acute kidney injury with metabolic acidosis/hypokalemia This is in the setting of nausea vomiting.  Noted to be on diuretic and ARB at home.  Also was on metformin  at home.   Patient was hydrated with improvement in renal function noted.  Monitor urine output.  Supplement potassium.  Metabolic acidosis has improved as well.  Anion gap is normal today.     Diabetes mellitus type 2 Noted to be on metformin  Jardiance  and Prandin  prior to admission.  No longer takes NovoLog .   HbA1c 4.9. Initial elevated anion gap was most likely due to volume issues and sepsis along with AKI rather than ketoacidosis. Improvement noted this morning.  Monitor CBGs.  Continue SSI.     Paroxysmal atrial fibrillation with RVR No previous history of atrial fibrillation.  Likely triggered by sepsis.   TSH noted to be 0.112.  Will check free T4. Echocardiogram is pending. Seems to have converted to sinus rhythm.  Noted to have sinus tachycardia.  Will repeat EKG. Depending on echocardiogram findings long-term anticoagulation can be addressed.    Transaminitis Significant bump in AST and ALT most likely due to sepsis.  CT scan of the abdomen did not show any abnormality in the hepatobiliary system.  She does have a history of gallbladder cancer.  Bilirubin is normal.  Continue to trend LFTs.  Abdomen remains benign.  Recently  checked hepatitis panel in 2024 was unremarkable.  Gallbladder cancer Followed by medical oncology.  Has completed radiation treatment.  Remains on immune treatment.  Hold these medications for now.   She does have a biliary stent.  Bilirubin is normal.  Elevated AST ALT probably due to sepsis.  Continue to trend.   Coronary artery disease status post CABG Cardiac status is stable.  Continue to monitor.   Peripheral artery disease She has had  angioplasties to lower extremity blood vessels previously.  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  Currently not an active issue.  She is noted to be on aspirin  which will be continued.  Was on low-dose Xarelto  which is currently being held since she will be on full dose Lovenox  for now.   Constipation CT of the abdomen pelvis suggest significant constipation.  TSH noted to be low. Has had multiple bowel movement with bowel regimen.  Continue for now.    DVT Prophylaxis: On full dose Lovenox  Code Status: Full code Family Communication: Discussed with patient and her son Disposition Plan: To be determined.  PT and OT eval.     Medications: Scheduled:  aspirin  EC  81 mg Oral QHS   brimonidine   1 drop Right Eye BID   enoxaparin  (LOVENOX ) injection  50 mg Subcutaneous Q24H   insulin  aspart  0-9 Units Subcutaneous TID WC   metoprolol  tartrate  25 mg Oral BID   vancomycin  variable dose per unstable renal function (pharmacist dosing)   Does not apply See admin instructions   Continuous:  ceFEPime (MAXIPIME) IV     PRN:acetaminophen  **OR** acetaminophen , bismuth subsalicylate, ondansetron  **OR** ondansetron  (ZOFRAN ) IV  Antibiotics: Anti-infectives (From admission, onward)    Start     Dose/Rate Route Frequency Ordered Stop   02/13/24 1400  ceFEPIme (MAXIPIME) 2 g in sodium chloride  0.9 % 100 mL IVPB        2 g 200 mL/hr over 30 Minutes Intravenous Every 24 hours 02/12/24 1746     02/12/24 1746  vancomycin  variable dose per unstable renal function (pharmacist dosing)         Does not apply See admin instructions 02/12/24 1746     02/12/24 1730  vancomycin  (VANCOCIN ) IVPB 1000 mg/200 mL premix  Status:  Discontinued        1,000 mg 200 mL/hr over 60 Minutes Intravenous  Once 02/12/24 1722 02/12/24 1722   02/12/24 1730  ceFEPIme (MAXIPIME) 2 g in sodium chloride  0.9 % 100 mL IVPB  Status:  Discontinued        2 g 200 mL/hr over 30 Minutes Intravenous  Once 02/12/24 1722  02/12/24 1722   02/12/24 1345  ceFEPIme (MAXIPIME) 2 g in sodium chloride  0.9 % 100 mL IVPB        2 g 200 mL/hr over 30 Minutes Intravenous  Once 02/12/24 1332 02/12/24 1439   02/12/24 1345  metroNIDAZOLE  (FLAGYL ) IVPB 500 mg        500 mg 100 mL/hr over 60 Minutes Intravenous  Once 02/12/24 1332 02/12/24 1600   02/12/24 1345  vancomycin  (VANCOCIN ) IVPB 1000 mg/200 mL premix        1,000 mg 200 mL/hr over 60 Minutes Intravenous  Once 02/12/24 1332 02/12/24 1541       Objective:  Vital Signs  Vitals:   02/13/24 0230 02/13/24 0330 02/13/24 0530 02/13/24 0632  BP: (!) 150/53 (!) 141/54 139/68   Pulse: (!) 112 (!) 108 (!) 109   Resp: 16 (!)  21 18   Temp:    (!) 97.4 F (36.3 C)  TempSrc:    Oral  SpO2: 99% 98% 100%   Weight:      Height:        Intake/Output Summary (Last 24 hours) at 02/13/2024 0959 Last data filed at 02/13/2024 0936 Gross per 24 hour  Intake 3995.66 ml  Output 5 ml  Net 3990.66 ml   Filed Weights   02/12/24 1407  Weight: 51.7 kg    General appearance: Awake alert.  In no distress Resp: Clear to auscultation bilaterally.  Normal effort Cardio: S1-S2 is tachycardic regular.  No S3-S4. GI: Abdomen is soft.  Nontender nondistended.  Bowel sounds are present normal.  No masses organomegaly Extremities: No edema.  Full range of motion of lower extremities. Neurologic: Alert and oriented x3.  No focal neurological deficits.    Lab Results:  Data Reviewed: I have personally reviewed following labs and reports of the imaging studies  CBC: Recent Labs  Lab 02/12/24 1330 02/13/24 0608  WBC 14.0* 17.7*  NEUTROABS 12.9*  --   HGB 12.0 11.3*  HCT 35.8* 34.2*  MCV 105.0* 104.6*  PLT 224 147*    Basic Metabolic Panel: Recent Labs  Lab 02/12/24 1330 02/12/24 1838 02/13/24 0608  NA 137 137 136  K 4.0 3.7 3.4*  CL 105 106 106  CO2 11* 17* 19*  GLUCOSE 132* 197* 138*  BUN 81* 74* 65*  CREATININE 2.31* 1.93* 1.49*  CALCIUM  10.2 9.7 9.6     GFR: Estimated Creatinine Clearance: 24.2 mL/min (A) (by C-G formula based on SCr of 1.49 mg/dL (H)).  Liver Function Tests: Recent Labs  Lab 02/12/24 1330 02/13/24 0608  AST 264* 639*  ALT 86* 281*  ALKPHOS 84 75  BILITOT 0.9 1.0  PROT 5.5* 4.9*  ALBUMIN  2.4* 2.1*    Coagulation Profile: Recent Labs  Lab 02/12/24 1330 02/13/24 0608  INR 2.1* 2.1*    HbA1C: Recent Labs    02/13/24 0608  HGBA1C 4.9    CBG: Recent Labs  Lab 02/12/24 2207 02/13/24 0825  GLUCAP 177* 119*    Thyroid  Function Tests: Recent Labs    02/13/24 0608  TSH 0.112*   Recent Results (from the past 240 hours)  Blood Culture (routine x 2)     Status: None (Preliminary result)   Collection Time: 02/12/24  1:25 PM   Specimen: BLOOD RIGHT FOREARM  Result Value Ref Range Status   Specimen Description BLOOD RIGHT FOREARM  Final   Special Requests   Final    BOTTLES DRAWN AEROBIC AND ANAEROBIC Blood Culture adequate volume   Culture   Final    NO GROWTH < 24 HOURS Performed at [facility], [address], [place], [place] [address]    Report Status PENDING  Incomplete  Resp panel by RT-PCR (RSV, Flu A&B, Covid) Anterior Nasal Swab     Status: None   Collection Time: 02/12/24  1:39 PM   Specimen: Anterior Nasal Swab  Result Value Ref Range Status   SARS Coronavirus 2 by RT PCR NEGATIVE NEGATIVE Final   Influenza A by PCR NEGATIVE NEGATIVE Final   Influenza B by PCR NEGATIVE NEGATIVE Final    Comment: (NOTE) The Xpert Xpress SARS-CoV-2/FLU/RSV plus assay is intended as an aid in the diagnosis of influenza from Nasopharyngeal swab specimens and should not be used as a sole basis for treatment. Nasal washings and aspirates are unacceptable for Xpert Xpress SARS-CoV-2/FLU/RSV testing.  Fact Sheet for Patients: BloggerCourse.com  Fact Sheet for Healthcare Providers: SeriousBroker.it  This test is not yet approved or cleared  by the [place]  FDA and has been authorized for detection and/or diagnosis of SARS-CoV-2 by FDA under an Emergency Use Authorization (EUA). This EUA will remain in effect (meaning this test can be used) for the duration of the COVID-19 declaration under Section 564(b)(1) of the Act, 21 U.S.C. section 360bbb-3(b)(1), unless the authorization is terminated or revoked.     Resp Syncytial Virus by PCR NEGATIVE NEGATIVE Final    Comment: (NOTE) Fact Sheet for Patients: BloggerCourse.com  Fact Sheet for Healthcare Providers: SeriousBroker.it  This test is not yet approved or cleared by the [place]  FDA and has been authorized for detection and/or diagnosis of SARS-CoV-2 by FDA under an Emergency Use Authorization (EUA). This EUA will remain in effect (meaning this test can be used) for the duration of the COVID-19 declaration under Section 564(b)(1) of the Act, 21 U.S.C. section 360bbb-3(b)(1), unless the authorization is terminated or revoked.  Performed at [facility], [address], [place], [place] [address]       Radiology Studies: DG Chest Port 1 View Result Date: 02/12/2024 CLINICAL DATA:  Questionable sepsis. EXAM: PORTABLE CHEST 1 VIEW COMPARISON:  Chest radiograph dated 07/12/2023 FINDINGS: Similar positioning of the left Port-A-Cath. There is diffuse interstitial coarsening. Faint bibasilar densities, likely atelectasis. Developing infiltrate in the right infrahilar region is not excluded. No pleural effusion or pneumothorax the cardiac silhouette. Median sternotomy wires and CABG vascular clips. No acute osseous pathology. IMPRESSION: Bibasilar atelectasis. Developing infiltrate in the right infrahilar region is not excluded. Electronically Signed   By: Angus Bark M.D.   On: 02/12/2024 18:31   CT ABDOMEN PELVIS WO CONTRAST Result Date: 02/12/2024 CLINICAL DATA:  Sepsis EXAM: CT ABDOMEN AND PELVIS  WITHOUT CONTRAST TECHNIQUE: Multidetector CT imaging of the abdomen and pelvis was performed following the standard protocol without IV contrast. RADIATION DOSE REDUCTION: This exam was performed according to the departmental dose-optimization program which includes automated exposure control, adjustment of the mA and/or kV according to patient size and/or use of iterative reconstruction technique. COMPARISON:  CT of the abdomen pelvis performed Jan 30, 2024 FINDINGS: Lower chest: Interval development of patchy multifocal airspace consolidation in the right middle lobe and right lower lobes. Hepatobiliary: A biliary stent is present. Small volume perihepatic ascites. Limited visualization of liver metastasis secondary to the absence of intravenous contrast agent. Pancreas: Nothing significant. Spleen: Nothing significant. Adrenals/Urinary Tract: No hydronephrosis. Vascular calcifications in both kidneys. The urinary bladder contains a small focus of air. Stomach/Bowel: The stomach is nondilated. A moderate volume of stool material is present throughout the colon. Vascular/Lymphatic: Extensive calcific atherosclerosis is present. No abdominal aortic aneurysm. Reproductive: Uterus and bilateral adnexa are unremarkable. Other: Nothing significant. Musculoskeletal: Not significantly changed. IMPRESSION: 1. Interval development of multifocal airspace infiltrates in the right middle lobe and right lower lobe, compatible with pneumonia. 2. A moderate to large volume of stool material is present throughout the colon which may represent sequelae of constipation. 3. Similar appearance of internal biliary stent. Electronically Signed   By: Reagan Camera M.D.   On: 02/12/2024 16:27       LOS: 1 day   [name]  [facility] Pager on [email or web address]  02/13/2024, 9:59 AM

## 2024-02-13 NOTE — Progress Notes (Addendum)
 [name]   DOB:August 21, 1941   ZO#:[id number]      ASSESSMENT & PLAN:  [name] is a 83 year old female patient who was admitted on 02/12/2024 with weakness and mental confusion.  Patient follows with medical oncology and radiation oncology for gallbladder cancer.  Gallbladder cancer with liver mets - Diagnosed June 2024 - Was started on first-line chemotherapy with cisplatin , gemcitabine  and durvalumab .  Repeat CT scan showed partial response and primary tumor site with invasion to the liver.  Currently on chemotherapy with capecitabine  and durvalumab . - Status post radiation therapy, completed in April 2025. - Has been monitoring tumor markers monthly as outpatient. -- Ordered CT chest w/o contrast, pending. - Medical oncology/Dr. Maryalice Smaller following closely  Generalized weakness Altered mental status - Improving mentation -- Likely multifactorial.  Daughter found patient weak and confused and called EMS. - Continue supportive care  Pneumonia Leukocytosis Sepsis  -- aspiration pneumonia -- pt was febrile in ED with elevated WBC -- abx as ordered  Transaminitis AKI - Elevated LFTs and elevated renal function - May be due to sepsis  -- continue IVF hydration - Continue to monitor LFTs/CMP  Anemia, macrocytic - Improving, hemoglobin 11.3. - No transfusional intervention required at this time - Continue to monitor CBC with differential  Thrombocytopenia - Platelets 147K intervention required at this time - Continue to monitor CBC with differential  Hypertension Diabetes CAD/PAD - Monitor blood pressure - Monitor blood sugar levels - Continue to monitor closely    Code Status Full  Subjective:  Patient seen awake and alert laying in bed with O2 2L via [place] intact.  Family at bedside who relate details around patient's admission, daughter found patient in bed lethargic, with fever.  Patient now appears stable, states she feels much better. No other acute distress is  noted.   Objective:   Intake/Output Summary (Last 24 hours) at 02/13/2024 1323 Last data filed at 02/13/2024 9811 Gross per 24 hour  Intake 3995.66 ml  Output 5 ml  Net 3990.66 ml     PHYSICAL EXAMINATION: ECOG PERFORMANCE STATUS: 3 - Symptomatic, >50% confined to bed  Vitals:   02/13/24 0632 02/13/24 1122  BP:  (!) 123/47  Pulse:  75  Resp:  17  Temp: (!) 97.4 F (36.3 C) 98 F (36.7 C)  SpO2:  97%   Filed Weights   02/12/24 1407 02/13/24 1115  Weight: 114 lb (51.7 kg) 114 lb (51.7 kg)    GENERAL: alert, no distress and comfortable SKIN: +pale skin color, texture, turgor are normal, no rashes or significant lesions EYES: normal, conjunctiva are pink and non-injected, sclera clear OROPHARYNX: no exudate, no erythema and lips, buccal mucosa, and tongue normal  NECK: supple, thyroid  normal size, non-tender, without nodularity LYMPH: no palpable lymphadenopathy in the cervical, axillary or inguinal LUNGS: clear to auscultation and percussion with +O2 via [place]  HEART: regular rate & rhythm and no murmurs and no lower extremity edema ABDOMEN: abdomen soft, non-tender and normal bowel sounds MUSCULOSKELETAL: no cyanosis of digits and no clubbing  PSYCH: alert & oriented with fluent speech    All questions were answered. The patient knows to call the clinic with any problems, questions or concerns.   The total time spent in the appointment was 40 minutes encounter with patient including review of chart and various tests results, discussions about plan of care and coordination of care plan  Jacqualin Mate, NP 02/13/2024 1:23 PM    Labs Reviewed:  Lab Results  Component Value  Date   WBC 17.7 (H) 02/13/2024   HGB 11.3 (L) 02/13/2024   HCT 34.2 (L) 02/13/2024   MCV 104.6 (H) 02/13/2024   PLT 147 (L) 02/13/2024   Recent Labs    01/15/24 1225 02/12/24 1330 02/12/24 1838 02/13/24 0608  NA 136 137 137 136  K 4.5 4.0 3.7 3.4*  CL 106 105 106 106  CO2 25 11* 17* 19*   GLUCOSE 238* 132* 197* 138*  BUN 65* 81* 74* 65*  CREATININE 1.12* 2.31* 1.93* 1.49*  CALCIUM  10.3 10.2 9.7 9.6  GFRNONAA 49* 21* 26* 35*  PROT 6.1* 5.5*  --  4.9*  ALBUMIN  3.4* 2.4*  --  2.1*  AST 22 264*  --  639*  ALT 12 86*  --  281*  ALKPHOS 97 84  --  75  BILITOT 0.5 0.9  --  1.0    Studies Reviewed:  DG Chest Port 1 View Result Date: 02/12/2024 CLINICAL DATA:  Questionable sepsis. EXAM: PORTABLE CHEST 1 VIEW COMPARISON:  Chest radiograph dated 07/12/2023 FINDINGS: Similar positioning of the left Port-A-Cath. There is diffuse interstitial coarsening. Faint bibasilar densities, likely atelectasis. Developing infiltrate in the right infrahilar region is not excluded. No pleural effusion or pneumothorax the cardiac silhouette. Median sternotomy wires and CABG vascular clips. No acute osseous pathology. IMPRESSION: Bibasilar atelectasis. Developing infiltrate in the right infrahilar region is not excluded. Electronically Signed   By: Angus Bark M.D.   On: 02/12/2024 18:31   CT ABDOMEN PELVIS WO CONTRAST Result Date: 02/12/2024 CLINICAL DATA:  Sepsis EXAM: CT ABDOMEN AND PELVIS WITHOUT CONTRAST TECHNIQUE: Multidetector CT imaging of the abdomen and pelvis was performed following the standard protocol without IV contrast. RADIATION DOSE REDUCTION: This exam was performed according to the departmental dose-optimization program which includes automated exposure control, adjustment of the mA and/or kV according to patient size and/or use of iterative reconstruction technique. COMPARISON:  CT of the abdomen pelvis performed Jan 30, 2024 FINDINGS: Lower chest: Interval development of patchy multifocal airspace consolidation in the right middle lobe and right lower lobes. Hepatobiliary: A biliary stent is present. Small volume perihepatic ascites. Limited visualization of liver metastasis secondary to the absence of intravenous contrast agent. Pancreas: Nothing significant. Spleen: Nothing  significant. Adrenals/Urinary Tract: No hydronephrosis. Vascular calcifications in both kidneys. The urinary bladder contains a small focus of air. Stomach/Bowel: The stomach is nondilated. A moderate volume of stool material is present throughout the colon. Vascular/Lymphatic: Extensive calcific atherosclerosis is present. No abdominal aortic aneurysm. Reproductive: Uterus and bilateral adnexa are unremarkable. Other: Nothing significant. Musculoskeletal: Not significantly changed. IMPRESSION: 1. Interval development of multifocal airspace infiltrates in the right middle lobe and right lower lobe, compatible with pneumonia. 2. A moderate to large volume of stool material is present throughout the colon which may represent sequelae of constipation. 3. Similar appearance of internal biliary stent. Electronically Signed   By: Reagan Camera M.D.   On: 02/12/2024 16:27   CT ABDOMEN PELVIS W CONTRAST Result Date: 01/30/2024 EXAMINATION: CT ABDOMEN PELVIS W CONTRAST CLINICAL INDICATION: Female, 83 years old. Gallbladder/biliary cancer, assess treatment response TECHNIQUE: Axial CT of the abdomen and pelvis with 100 cc Omnipaque  300 intravenous contrast. Multiplanar reformations provided. Unless otherwise specified, incidental thyroid , adrenal, renal lesions do not require dedicated imaging follow up. Additionally, any mentioned pulmonary nodules do not require dedicated imaging follow-up based on the Fleischner guidelines unless otherwise specified. Coronary calcifications are not identified unless otherwise specified. COMPARISON: 09/27/2023 FINDINGS: The lung bases are clear. The  heart is normal in size. There are coronary calcifications. There is a similar 2.3 cm metastasis within hepatic segment 4B/5. The gallbladder is surgically absent. There is a patent common bile duct stent. The spleen is normal. The pancreas appears normal. The adrenals are normal. Kidneys are normal. The abdominal aorta is normal in caliber.  Scattered atherosclerotic changes are present. The urinary bladder is normal. The uterus is normal. Large and small bowel loops are otherwise within normal limits. There is no free fluid or pathologic lymphadenopathy by size criteria. There is diffuse osseous mineralization and degenerative changes of the spine and bony pelvis. No concerning osseous lesions. IMPRESSION: Similar hepatic metastasis. Patent common bile duct stent. DOSE REDUCTION: This exam was performed according to our departmental dose-optimization program which includes automated exposure control, adjustment of the mA and/or kV according to patient size and/or use of iterative reconstruction technique. Electronically signed by: [place] Engel MD 01/30/2024 01:09 PM EDT RP Workstation: [id number]   Addendum I have seen the patient, examined her. I agree with the assessment and and plan and have edited the notes.   Patient has unresectable gallbladder cancer, status post chemotherapy and radiation, currently on maintenance immunotherapy durvalumab , who was admitted to hospital yesterday due to weakness and confusion.  CT abdomen and pelvis showed infiltrative change in the right lower lobe.  She is being treated for pneumonia and sepsis.  I recommend a CT chest without contrast for further evaluation.  Immunotherapy induced pneumonitis is on the differential, usually will be bilateral changes in the lungs.  If she clinically does not improve with antibiotics, may consider consulting pulmonary and put her on steroid.  I spoke with her daughter also.  All questions were answered.  Sonja [place]  02/13/2024

## 2024-02-13 NOTE — Evaluation (Signed)
 Physical Therapy Evaluation Patient Details Name: [name] MRN: [id number] DOB: 11-05-1940 Today's Date: 02/13/2024  History of Present Illness  Patient is a 83 year old female with AMS, severe sepsis, pneumonia. PMH: gallbladder cancer with radiation treatment, HTN, DM2, CAD, PAD, s/p CABG, bilateral TMA's.  Clinical Impression  Patient is agreeable to PT evaluation. She reports she is independent at baseline with ambulation. She reports her daughter lives with her but works during the day.  Today the patient is fatigued with minimal activity. She required brief Min A for bed mobility. She declined attempting to stand due to feeling generalized weakness and fatigue with minimal activity. No dyspnea noted with exertion. The patient is not at her baseline level of functional independence. Anticipate she may require caregiver support to return home safely. PT will continue to follow to maximize independence and decrease caregiver burden. She is requesting a wheelchair to use in her home.       If plan is discharge home, recommend the following: A little help with walking and/or transfers;A little help with bathing/dressing/bathroom;Assist for transportation;Help with stairs or ramp for entrance;Assistance with cooking/housework   Can travel by Doctor, hospital (measurements PT)  Recommendations for Other Services       Functional Status Assessment Patient has had a recent decline in their functional status and demonstrates the ability to make significant improvements in function in a reasonable and predictable amount of time.     Precautions / Restrictions Precautions Precautions: Fall Recall of Precautions/Restrictions: Intact Restrictions Weight Bearing Restrictions Per Provider Order: No      Mobility  Bed Mobility Overal bed mobility: Needs Assistance Bed Mobility: Supine to Sit, Sit to Supine     Supine to sit: Min assist, HOB  elevated Sit to supine: Contact guard assist, HOB elevated   General bed mobility comments: intermittent assistance for trunk support to sit upright. increased time and effort    Transfers                   General transfer comment: patient declined due to fatigue and generalized weakness.    Ambulation/Gait                  Stairs            Wheelchair Mobility     Tilt Bed    Modified Rankin (Stroke Patients Only)       Balance Overall balance assessment: Needs assistance Sitting-balance support: Feet supported, Single extremity supported Sitting balance-Leahy Scale: Fair Sitting balance - Comments: occasional posterior trunk lean without at least unilateral UE support, self corrected                                     Pertinent Vitals/Pain Pain Assessment Pain Assessment: No/denies pain    Home Living Family/patient expects to be discharged to:: Private residence Living Arrangements: Children Available Help at Discharge: Family;Available PRN/intermittently Type of Home: House Home Access: Stairs to enter   Entrance Stairs-Number of Steps: 1   Home Layout: One level Home Equipment: Agricultural consultant (2 wheels);Cane - single point;BSC/3in1      Prior Function Prior Level of Function : Independent/Modified Independent;Driving             Mobility Comments: independent without assistive device. occasionally drives but reports her daughter drives most of the time ADLs Comments:  independent     Extremity/Trunk Assessment   Upper Extremity Assessment Upper Extremity Assessment: Generalized weakness    Lower Extremity Assessment Lower Extremity Assessment: Generalized weakness (prior trans met amputations)       Communication   Communication Communication: No apparent difficulties    Cognition Arousal: Alert Behavior During Therapy: WFL for tasks assessed/performed   PT - Cognitive impairments: No  family/caregiver present to determine baseline, Orientation, Memory   Orientation impairments: Time                   PT - Cognition Comments: decreased recall of recent events. she reports today is Saturday. Following commands: Impaired Following commands impaired: Follows one step commands with increased time     Cueing Cueing Techniques: Verbal cues     General Comments General comments (skin integrity, edema, etc.): no dizziness reported with upright activity and no dyspnea with exertion. however patient does report fatigue with minimal activity    Exercises     Assessment/Plan    PT Assessment Patient needs continued PT services  PT Problem List Decreased strength;Decreased range of motion;Decreased activity tolerance;Decreased balance;Decreased mobility;Decreased cognition       PT Treatment Interventions DME instruction;Gait training;Stair training;Functional mobility training;Therapeutic activities;Therapeutic exercise;Balance training;Neuromuscular re-education;Cognitive remediation;Wheelchair mobility training;Patient/family education    PT Goals (Current goals can be found in the Care Plan section)  Acute Rehab PT Goals Patient Stated Goal: to be able to get around her home in a wheelchair PT Goal Formulation: With patient Time For Goal Achievement: 02/27/24 Potential to Achieve Goals: Fair    Frequency Min 2X/week     Co-evaluation               AM-PAC PT "6 Clicks" Mobility  Outcome Measure Help needed turning from your back to your side while in a flat bed without using bedrails?: A Little Help needed moving from lying on your back to sitting on the side of a flat bed without using bedrails?: A Little Help needed moving to and from a bed to a chair (including a wheelchair)?: A Little Help needed standing up from a chair using your arms (e.g., wheelchair or bedside chair)?: A Little Help needed to walk in hospital room?: A Little Help needed  climbing 3-5 steps with a railing? : A Lot 6 Click Score: 17    End of Session Equipment Utilized During Treatment: Oxygen  Activity Tolerance: Patient limited by fatigue Patient left: in bed;with call bell/phone within reach;with bed alarm set   PT Visit Diagnosis: Muscle weakness (generalized) (M62.81);Difficulty in walking, not elsewhere classified (R26.2)    Time: 5284-1324 PT Time Calculation (min) (ACUTE ONLY): 17 min   Charges:   PT Evaluation $PT Eval Low Complexity: 1 Low   PT General Charges $$ ACUTE PT VISIT: 1 Visit         Ozie Bo, PT, MPT   Erlene Hawks 02/13/2024, 2:14 PM

## 2024-02-13 NOTE — TOC CM/SW Note (Signed)
    Durable Medical Equipment  (From admission, onward)           Start     Ordered   02/13/24 1640  For home use only DME standard manual wheelchair with seat cushion  Once       Comments: Patient suffers from  fatigued with minimal activity, decline in functional status, Gallbladder cancer  which impairs their ability to perform daily activities like ambulating  in the home.  A cane will not resolve issue with performing activities of daily living. A wheelchair will allow patient to safely perform daily activities. Patient can safely propel the wheelchair in the home or has a caregiver who can provide assistance. Length of need lifetime . Accessories: elevating leg rests (ELRs), wheel locks, extensions and anti-tippers.  Seat and back cushions   Ht 5'5" and wt 51.7 kg   02/13/24 1642

## 2024-02-13 NOTE — Progress Notes (Signed)
 Messaged MD for Imodium . Patient has been receiving laxatives and now having 4 episodes of loose stools.  Patient has allergic reaction to Imodium Per MD will hold all stool softeners and laxatives .

## 2024-02-13 NOTE — Progress Notes (Signed)
  Echocardiogram 2D Echocardiogram has been performed.  Farley Honer, RDCS 02/13/2024, 12:10 PM

## 2024-02-13 NOTE — Progress Notes (Signed)
   02/13/24 1653  TOC Brief Assessment  Insurance and Status Reviewed  Patient has primary care physician Yes  Home environment has been reviewed home alone with support of daughter and son  Prior level of function: walker  Prior/Current Home Services No current home services  Social Drivers of Health Review SDOH reviewed no interventions necessary  Readmission risk has been reviewed Yes   Spoke with daughter and son at bedside.   PT recommends wheelchair ordered with [employer].   Provided home health list will follow up for [facility]

## 2024-02-14 ENCOUNTER — Inpatient Hospital Stay (HOSPITAL_COMMUNITY)

## 2024-02-14 DIAGNOSIS — J189 Pneumonia, unspecified organism: Secondary | ICD-10-CM | POA: Diagnosis not present

## 2024-02-14 DIAGNOSIS — R7401 Elevation of levels of liver transaminase levels: Secondary | ICD-10-CM | POA: Diagnosis not present

## 2024-02-14 DIAGNOSIS — A419 Sepsis, unspecified organism: Secondary | ICD-10-CM | POA: Diagnosis not present

## 2024-02-14 DIAGNOSIS — N179 Acute kidney failure, unspecified: Secondary | ICD-10-CM | POA: Diagnosis not present

## 2024-02-14 DIAGNOSIS — I480 Paroxysmal atrial fibrillation: Secondary | ICD-10-CM

## 2024-02-14 LAB — CBC
HCT: 34.3 % — ABNORMAL LOW (ref 36.0–46.0)
Hemoglobin: 11.8 g/dL — ABNORMAL LOW (ref 12.0–15.0)
MCH: 34.9 pg — ABNORMAL HIGH (ref 26.0–34.0)
MCHC: 34.4 g/dL (ref 30.0–36.0)
MCV: 101.5 fL — ABNORMAL HIGH (ref 80.0–100.0)
Platelets: 119 10*3/uL — ABNORMAL LOW (ref 150–400)
RBC: 3.38 MIL/uL — ABNORMAL LOW (ref 3.87–5.11)
RDW: 17.8 % — ABNORMAL HIGH (ref 11.5–15.5)
WBC: 13.4 10*3/uL — ABNORMAL HIGH (ref 4.0–10.5)
nRBC: 0 % (ref 0.0–0.2)

## 2024-02-14 LAB — GLUCOSE, CAPILLARY
Glucose-Capillary: 81 mg/dL (ref 70–99)
Glucose-Capillary: 132 mg/dL — ABNORMAL HIGH (ref 70–99)
Glucose-Capillary: 115 mg/dL — ABNORMAL HIGH (ref 70–99)
Glucose-Capillary: 119 mg/dL — ABNORMAL HIGH (ref 70–99)

## 2024-02-14 LAB — COMPREHENSIVE METABOLIC PANEL WITH GFR
ALT: 674 U/L — ABNORMAL HIGH (ref 0–44)
AST: 1133 U/L — ABNORMAL HIGH (ref 15–41)
Albumin: 1.8 g/dL — ABNORMAL LOW (ref 3.5–5.0)
Alkaline Phosphatase: 107 U/L (ref 38–126)
Anion gap: 7 (ref 5–15)
BUN: 46 mg/dL — ABNORMAL HIGH (ref 8–23)
CO2: 22 mmol/L (ref 22–32)
Calcium: 9.2 mg/dL (ref 8.9–10.3)
Chloride: 111 mmol/L (ref 98–111)
Creatinine, Ser: 1.08 mg/dL — ABNORMAL HIGH (ref 0.44–1.00)
GFR, Estimated: 52 mL/min — ABNORMAL LOW (ref >60)
Glucose, Bld: 78 mg/dL (ref 70–99)
Potassium: 3.9 mmol/L (ref 3.5–5.1)
Sodium: 140 mmol/L (ref 135–145)
Total Bilirubin: 0.7 mg/dL (ref 0.0–1.2)
Total Protein: 4.6 g/dL — ABNORMAL LOW (ref 6.5–8.1)

## 2024-02-14 LAB — T4, FREE: Free T4: 1.16 ng/dL — ABNORMAL HIGH (ref 0.61–1.12)

## 2024-02-14 MED ORDER — MELATONIN 5 MG PO TABS
5.0000 mg | ORAL_TABLET | Freq: Every evening | ORAL | Status: DC | PRN
  Administered 2024-02-14: 5 mg via ORAL
  Filled 2024-02-14: qty 1

## 2024-02-14 MED ORDER — DOXYCYCLINE HYCLATE 100 MG PO TABS
100.0000 mg | ORAL_TABLET | Freq: Two times a day (BID) | ORAL | Status: AC
  Administered 2024-02-14 – 2024-02-16 (×5): 100 mg via ORAL
  Filled 2024-02-14 (×5): qty 1

## 2024-02-14 MED ORDER — ENOXAPARIN SODIUM 60 MG/0.6ML IJ SOSY
50.0000 mg | PREFILLED_SYRINGE | Freq: Two times a day (BID) | INTRAMUSCULAR | Status: DC
  Administered 2024-02-14 – 2024-02-16 (×5): 50 mg via SUBCUTANEOUS
  Filled 2024-02-14 (×6): qty 0.6

## 2024-02-14 MED ORDER — SODIUM CHLORIDE 0.9 % IV SOLN
2.0000 g | INTRAVENOUS | Status: AC
  Administered 2024-02-14 – 2024-02-18 (×5): 2 g via INTRAVENOUS
  Filled 2024-02-14 (×5): qty 20

## 2024-02-14 NOTE — Plan of Care (Signed)
  Problem: Nutritional: Goal: Maintenance of adequate nutrition will improve Outcome: Progressing   Problem: Skin Integrity: Goal: Risk for impaired skin integrity will decrease Outcome: Progressing   Problem: Respiratory: Goal: Ability to maintain adequate ventilation will improve Outcome: Progressing   Problem: Activity: Goal: Ability to tolerate increased activity will improve Outcome: Progressing   Problem: Respiratory: Goal: Ability to maintain adequate ventilation will improve Outcome: Progressing Goal: Ability to maintain a clear airway will improve Outcome: Progressing

## 2024-02-14 NOTE — Progress Notes (Signed)
 PHARMACY - ANTICOAGULATION CONSULT NOTE  Pharmacy Consult for enoxaparin   Indication: atrial fibrillation  Allergies  Allergen Reactions   Versed  [Midazolam ] Anxiety    Frantic, out of my mind, agitated    Augmentin [Amoxicillin-Pot Clavulanate] Other (See Comments)    Elevates potassium level   Bactrim  [Sulfamethoxazole -Trimethoprim ] Other (See Comments)    ^ K+( elevated)    Demerol [Meperidine]     Delusional    Meperidine Hcl     Other reaction(s): delusional   Scopolamine     Delusional   Patch *   Tramadol  Other (See Comments)    Keeps awake    Patient Measurements: Height: 5\' 5"  (165.1 cm) Weight: 51.7 kg (114 lb) IBW/kg (Calculated) : 57 HEPARIN  DW (KG): 51.7  Vital Signs: Temp: 97.6 F (36.4 C) (05/23 0439) Temp Source: Oral (05/23 0439) BP: 140/55 (05/23 0732) Pulse Rate: 83 (05/23 0732)  Labs: Recent Labs    02/12/24 1330 02/12/24 1838 02/13/24 0608 02/14/24 0901  HGB 12.0  --  11.3* 11.8*  HCT 35.8*  --  34.2* 34.3*  PLT 224  --  147* 119*  LABPROT 23.7*  --  23.5*  --   INR 2.1*  --  2.1*  --   CREATININE 2.31* 1.93* 1.49* 1.08*    Estimated Creatinine Clearance: 33.3 mL/min (A) (by C-G formula based on SCr of 1.08 mg/dL (H)).   Assessment: Patient with new onset Afib w/ RVR. On Xarelto  2.5mg  BID PTA for PAD- last dose 5/20 PM.   SCr improved, est CrCl ~33 ml/min. Noted plan to change Lovenox  to Xarelto  therapeutic dose when LFTs improve. Hgb stable, plt down to 119 (baseline 224).  Goal of Therapy:  Anti-Xa level 0.6-1 units/ml 4hrs after LMWH dose given Monitor platelets by anticoagulation protocol: Yes   Plan:  Change Lovenox  to 50mg  SQ q12h Plan anti-Xa level in 2-3 days if unable to transition to po   Thank you for allowing pharmacy to be a part of this patient's care.  Enrigue Harvard, PharmD, BCPS Please see amion for complete clinical pharmacist phone list 02/14/2024 10:56 AM

## 2024-02-14 NOTE — Progress Notes (Signed)
 OT Cancellation Note  Patient Details Name: [name] MRN: [id number] DOB: 07/11/1941   Cancelled Treatment:    Reason Eval/Treat Not Completed: Patient at procedure or test/ unavailable. Pt receiving ultrasound. OT will return as able to.    Trenee Igoe C, OT  Acute Rehabilitation Services Office [phone] Secure chat preferred   Mickael Alamo 02/14/2024, 11:47 AM

## 2024-02-14 NOTE — TOC Progression Note (Signed)
 Transition of Care (TOC) - Progression Note   Followed up with patient and family at bedside. Daughter Jyl Or. They would like [facility] for home health services. Randel Buss with [facility] accepted referral  Wheelchair for home at bedside  Patient Details  Name: [name] MRN: [id number] Date of Birth: December 15, 1940  Transition of Care [facility]) CM/SW Contact  Charolet Cope, Arturo Late, RN Phone Number: 02/14/2024, 1:07 PM  Clinical Narrative:            Expected Discharge Plan and Services                                               Social Determinants of Health (SDOH) Interventions SDOH Screenings   Food Insecurity: No Food Insecurity (02/13/2024)  Housing: Low Risk  (02/13/2024)  Transportation Needs: No Transportation Needs (02/13/2024)  Utilities: Not At Risk (02/13/2024)  Depression (PHQ2-9): Low Risk  (11/27/2021)  Social Connections: Moderately Isolated (02/13/2024)  Tobacco Use: Medium Risk (02/12/2024)    Readmission Risk Interventions     No data to display

## 2024-02-14 NOTE — Care Management Important Message (Signed)
 Important Message  Patient Details  Name: [name] MRN: [id number] Date of Birth: April 29, 1941   Important Message Given:  Yes - Medicare IM     Felix Host 02/14/2024, 1:04 PM

## 2024-02-14 NOTE — Progress Notes (Addendum)
 [facility] PROGRESS NOTE   [name] ZOX:[id number] DOB: 03/17/1941 DOA: 02/12/2024  PCP: Ransom Byers, MD  Brief History: 83 y.o. female with a past medical history of essential hypertension, diabetes mellitus type 2, coronary artery disease, peripheral artery disease status post multiple interventions previously, status post CABG previously who is under the care of cancer Center for history of gallbladder cancer.  She has completed radiation treatment.  She has also continue treatment with the capecitabine .  She continues to get treatment with durvalumab .  She completed radiation treatment in April.  Her last infusion with the durvalumab  was on April 23.  Patient missed her appointments at the cancer center.  When daughter went to check on her she was lying on the bed and was noted to be confused.  She was brought into the hospital.  She was noted to be febrile.  Noted to have pneumonia.  She was hospitalized for further management.     Consultants: None  Procedures: None    Subjective/Interval History: Patient mentions that she feels well.  Denies any nausea vomiting.  No chest pain or shortness of breath at this time.  Son is at the bedside.  Anticoagulation was discussed with both of them and they are agreeable to it.      Assessment/Plan:   Pneumonia most likely aspiration pneumonia with severe sepsis, present on admission Patient was febrile in the ED with elevated WBC.  She also had lactic acidosis with initial level of 7.8.  Improvement noted. COVID-19 influenza and RSV PCR negative.  CT suggested right lung pneumonia.  Patient likely aspirated as she mentioned episodes of nausea and vomiting over the last few days prior to admission. Patient was quite tachycardic at admission.  She met criteria for severe sepsis due to AKI. Patient has improved.  WBC has improved.  She is afebrile.  Should be able to come off of oxygen . Blood cultures negative so far. She  is currently on vancomycin  and cefepime. Will narrow spectrum.  Changed to ceftriaxone  and doxycycline .   Acute kidney injury with metabolic acidosis/hypokalemia This is in the setting of nausea vomiting.  Noted to be on diuretic and ARB at home.  Also was on metformin  at home.   Patient was hydrated with improvement in renal function noted.  Creatinine is almost back to baseline.  Potassium level is normal.  Metabolic acidosis has resolved.      Diabetes mellitus type 2 Noted to be on metformin  Jardiance  and Prandin  prior to admission.  No longer takes NovoLog .   HbA1c 4.9. Monitor CBGs.   Paroxysmal atrial fibrillation with RVR No previous history of atrial fibrillation.  Likely triggered by sepsis.   TSH noted to be 0.112.  Free T4 is 1.16.  No change in treatment plan.  Will recommend thyroid  function test be rechecked in a few weeks. Echocardiogram shows normal systolic function with EF of 55 to 60%. CHADS2 vascular score is 6.  Anticoagulation discussed.  Patient was on low-dose Xarelto  previously.  Agreeable to therapeutic dose Xarelto .  Will change to Xarelto  once LFTs start improving.  Will discontinue aspirin  at that time Continue metoprolol . Outpatient follow-up with cardiology.  Transaminitis LFTs continue to worsen.  Likely due to severe sepsis.  Bilirubin and alkaline phosphatase levels normal.   CT scan of the abdomen done at the time of admission did not show any abnormality in the hepatobiliary system.  She does have a history of gallbladder cancer.   Will do a  right upper quadrant ultrasound.  Abdomen remains benign.   Recently checked hepatitis panel in 2024 was unremarkable. Continue to trend transaminases. Not noted to be on statin currently.  Was on rosuvastatin  prior to admission.  Gallbladder cancer Followed by medical oncology.  Has completed radiation treatment.  Remains on immune treatment.  Hold these medications for now.   She does have a biliary stent.   Bilirubin is normal.  Elevated AST ALT probably due to sepsis.  Continue to trend.   Coronary artery disease status post CABG Cardiac status is stable.  Continue to monitor.   Peripheral artery disease She has had angioplasties to lower extremity blood vessels previously.  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  Currently not an active issue.    Constipation CT of the abdomen pelvis suggest significant constipation.  TSH noted to be low. Has had multiple bowel movement with bowel regimen.  Continue for now.    DVT Prophylaxis: Full dose Lovenox  Code Status: Full code Family Communication: Discussed with patient and her son Disposition Plan: Home health is recommended     Medications: Scheduled:  aspirin  EC  81 mg Oral QHS   brimonidine   1 drop Right Eye BID   enoxaparin  (LOVENOX ) injection  50 mg Subcutaneous Q24H   insulin  aspart  0-9 Units Subcutaneous TID WC   metoprolol  tartrate  25 mg Oral BID   Continuous:  ceFEPime (MAXIPIME) IV 2 g (02/13/24 1424)   vancomycin  500 mg (02/13/24 1427)   PRN:acetaminophen  **OR** acetaminophen , bismuth subsalicylate, ondansetron  **OR** ondansetron  (ZOFRAN ) IV  Antibiotics: Anti-infectives (From admission, onward)    Start     Dose/Rate Route Frequency Ordered Stop   02/13/24 1400  ceFEPIme (MAXIPIME) 2 g in sodium chloride  0.9 % 100 mL IVPB        2 g 200 mL/hr over 30 Minutes Intravenous Every 24 hours 02/12/24 1746     02/13/24 1400  vancomycin  (VANCOREADY) IVPB 500 mg/100 mL        500 mg 100 mL/hr over 60 Minutes Intravenous Every 24 hours 02/13/24 1321     02/12/24 1746  vancomycin  variable dose per unstable renal function (pharmacist dosing)  Status:  Discontinued         Does not apply See admin instructions 02/12/24 1746 02/13/24 1321   02/12/24 1730  vancomycin  (VANCOCIN ) IVPB 1000 mg/200 mL premix  Status:  Discontinued        1,000 mg 200 mL/hr over 60 Minutes Intravenous  Once 02/12/24 1722 02/12/24 1722    02/12/24 1730  ceFEPIme (MAXIPIME) 2 g in sodium chloride  0.9 % 100 mL IVPB  Status:  Discontinued        2 g 200 mL/hr over 30 Minutes Intravenous  Once 02/12/24 1722 02/12/24 1722   02/12/24 1345  ceFEPIme (MAXIPIME) 2 g in sodium chloride  0.9 % 100 mL IVPB        2 g 200 mL/hr over 30 Minutes Intravenous  Once 02/12/24 1332 02/12/24 1439   02/12/24 1345  metroNIDAZOLE  (FLAGYL ) IVPB 500 mg        500 mg 100 mL/hr over 60 Minutes Intravenous  Once 02/12/24 1332 02/12/24 1600   02/12/24 1345  vancomycin  (VANCOCIN ) IVPB 1000 mg/200 mL premix        1,000 mg 200 mL/hr over 60 Minutes Intravenous  Once 02/12/24 1332 02/12/24 1541       Objective:  Vital Signs  Vitals:   02/13/24 1940 02/14/24 0017 02/14/24 0439 02/14/24 0732  BP: 134/61 (!) 113/51 (!) 128/49 (!) 140/55  Pulse: 84 68 73 83  Resp: 16 18 18 14   Temp: (!) 97.5 F (36.4 C) 97.9 F (36.6 C) 97.6 F (36.4 C)   TempSrc: Axillary Oral Oral   SpO2: 99% 98% 97% 99%  Weight:      Height:        Intake/Output Summary (Last 24 hours) at 02/14/2024 1020 Last data filed at 02/14/2024 0804 Gross per 24 hour  Intake --  Output 200 ml  Net -200 ml   Filed Weights   02/12/24 1407 02/13/24 1115  Weight: 51.7 kg 51.7 kg    General appearance: Awake alert.  In no distress Resp: Clear to auscultation bilaterally.  Normal effort Cardio: S1-S2 is normal regular.  No S3-S4.  No rubs murmurs or bruit GI: Abdomen is soft.  Nontender nondistended.  Bowel sounds are present normal.  No masses organomegaly Extremities: No edema.  Physical deconditioning noted. No obvious focal neurological deficits   Lab Results:  Data Reviewed: I have personally reviewed following labs and reports of the imaging studies  CBC: Recent Labs  Lab 02/12/24 1330 02/13/24 0608 02/14/24 0901  WBC 14.0* 17.7* 13.4*  NEUTROABS 12.9*  --   --   HGB 12.0 11.3* 11.8*  HCT 35.8* 34.2* 34.3*  MCV 105.0* 104.6* 101.5*  PLT 224 147* 119*    Basic  Metabolic Panel: Recent Labs  Lab 02/12/24 1330 02/12/24 1838 02/13/24 0608 02/14/24 0901  NA 137 137 136 140  K 4.0 3.7 3.4* 3.9  CL 105 106 106 111  CO2 11* 17* 19* 22  GLUCOSE 132* 197* 138* 78  BUN 81* 74* 65* 46*  CREATININE 2.31* 1.93* 1.49* 1.08*  CALCIUM  10.2 9.7 9.6 9.2    GFR: Estimated Creatinine Clearance: 33.3 mL/min (A) (by C-G formula based on SCr of 1.08 mg/dL (H)).  Liver Function Tests: Recent Labs  Lab 02/12/24 1330 02/13/24 0608 02/14/24 0901  AST 264* 639* 1,133*  ALT 86* 281* 674*  ALKPHOS 84 75 107  BILITOT 0.9 1.0 0.7  PROT 5.5* 4.9* 4.6*  ALBUMIN  2.4* 2.1* 1.8*    Coagulation Profile: Recent Labs  Lab 02/12/24 1330 02/13/24 0608  INR 2.1* 2.1*    HbA1C: Recent Labs    02/13/24 0608  HGBA1C 4.9    CBG: Recent Labs  Lab 02/13/24 1131 02/13/24 1552 02/13/24 1742 02/13/24 2025 02/14/24 0729  GLUCAP 134* 140* 118* 111* 81    Thyroid  Function Tests: Recent Labs    02/13/24 0608 02/14/24 0901  TSH 0.112*  --   FREET4  --  1.16*   Recent Results (from the past 240 hours)  Blood Culture (routine x 2)     Status: None (Preliminary result)   Collection Time: 02/12/24  1:25 PM   Specimen: BLOOD RIGHT FOREARM  Result Value Ref Range Status   Specimen Description BLOOD RIGHT FOREARM  Final   Special Requests   Final    BOTTLES DRAWN AEROBIC AND ANAEROBIC Blood Culture adequate volume   Culture   Final    NO GROWTH 2 DAYS Performed at [facility], [address] [place], [place] [address]    Report Status PENDING  Incomplete  Blood Culture (routine x 2)     Status: None (Preliminary result)   Collection Time: 02/12/24  1:32 PM   Specimen: BLOOD LEFT FOREARM  Result Value Ref Range Status   Specimen Description BLOOD LEFT FOREARM  Final   Special  Requests   Final    BOTTLES DRAWN AEROBIC AND ANAEROBIC Blood Culture adequate volume   Culture   Final    NO GROWTH 2 DAYS Performed at [facility], [address] [place], [place] [address]    Report Status PENDING  Incomplete  Resp panel by RT-PCR (RSV, Flu A&B, Covid) Anterior Nasal Swab     Status: None   Collection Time: 02/12/24  1:39 PM   Specimen: Anterior Nasal Swab  Result Value Ref Range Status   SARS Coronavirus 2 by RT PCR NEGATIVE NEGATIVE Final   Influenza A by PCR NEGATIVE NEGATIVE Final   Influenza B by PCR NEGATIVE NEGATIVE Final    Comment: (NOTE) The Xpert Xpress SARS-CoV-2/FLU/RSV plus assay is intended as an aid in the diagnosis of influenza from Nasopharyngeal swab specimens and should not be used as a sole basis for treatment. Nasal washings and aspirates are unacceptable for Xpert Xpress SARS-CoV-2/FLU/RSV testing.  Fact Sheet for Patients: BloggerCourse.com  Fact Sheet for Healthcare Providers: SeriousBroker.it  This test is not yet approved or cleared by the [place]  FDA and has been authorized for detection and/or diagnosis of SARS-CoV-2 by FDA under an Emergency Use Authorization (EUA). This EUA will remain in effect (meaning this test can be used) for the duration of the COVID-19 declaration under Section 564(b)(1) of the Act, 21 U.S.C. section 360bbb-3(b)(1), unless the authorization is terminated or revoked.     Resp Syncytial Virus by PCR NEGATIVE NEGATIVE Final    Comment: (NOTE) Fact Sheet for Patients: BloggerCourse.com  Fact Sheet for Healthcare Providers: SeriousBroker.it  This test is not yet approved or cleared by the [place]  FDA and has been authorized for detection and/or diagnosis of SARS-CoV-2 by FDA under an Emergency Use Authorization (EUA). This EUA will remain in effect (meaning this test can be used) for the duration of the COVID-19 declaration under Section 564(b)(1) of the Act, 21 U.S.C. section 360bbb-3(b)(1), unless the authorization is terminated  or revoked.  Performed at [facility], [address] [place], [place] [address]   Urine Culture     Status: Abnormal (Preliminary result)   Collection Time: 02/12/24  1:50 PM   Specimen: Urine, Random  Result Value Ref Range Status   Specimen Description URINE, RANDOM  Final   Special Requests NONE Reflexed from [id number]  Final   Culture (A)  Final    70,000 COLONIES/mL GRAM NEGATIVE RODS SUSCEPTIBILITIES TO FOLLOW Performed at [facility], [address] [place], [place] [address]    Report Status PENDING  Incomplete      Radiology Studies: CT CHEST WO CONTRAST Result Date: 02/13/2024 CLINICAL DATA:  Known right basilar infiltrate EXAM: CT CHEST WITHOUT CONTRAST TECHNIQUE: Multidetector CT imaging of the chest was performed following the standard protocol without IV contrast. RADIATION DOSE REDUCTION: This exam was performed according to the departmental dose-optimization program which includes automated exposure control, adjustment of the mA and/or kV according to patient size and/or use of iterative reconstruction technique. COMPARISON:  CT from the previous day, 09/27/2023 FINDINGS: Cardiovascular: Somewhat limited due to lack of IV contrast. Atherosclerotic calcifications of the aorta are noted without aneurysmal dilatation. Coronary bypass grafting is seen as well as heavy coronary calcifications. Left chest wall port is noted in satisfactory position. Mediastinum/Nodes: Thoracic inlet is within normal limits. No sizable hilar or mediastinal adenopathy is noted. A few small likely reactive nodes are seen. Lungs/Pleura: Lungs are well aerated bilaterally. Mild infiltrate  is noted in the medial aspect of the left lower lobe. More marked infiltrate is noted in the right lower lobe and right middle lobe similar to that seen on the prior exam. A few small nodular changes are noted within the right upper lobe which are stable in appearance from January of 2025 as well as October  of 2024. No new significant nodules are noted. Upper Abdomen: Visualized upper abdomen shows a biliary stent and mild pneumobilia. Mild ascites surrounding the liver is seen. This is new from the prior exam. Musculoskeletal: No chest wall mass or suspicious bone lesions identified. IMPRESSION: Bibasilar infiltrates right greater than left described. Multiple pulmonary nodules are noted in the right upper lobe stable from the prior studies. Mild abdominal ascites surrounding liver. Electronically Signed   By: Violeta Grey M.D.   On: 02/13/2024 21:33   ECHOCARDIOGRAM COMPLETE Result Date: 02/13/2024    ECHOCARDIOGRAM REPORT   Patient Name:   NUSAYBA CADENAS Date of Exam: 02/13/2024 Medical Rec #:  [id number]     Height:       65.0 in Accession #:    [id number]    Weight:       114.0 lb Date of Birth:  August 07, 1941     BSA:          1.558 m Patient Age:    82 years      BP:           139/68 mmHg Patient Gender: F             HR:           76 bpm. Exam Location:  Inpatient Procedure: 2D Echo, Color Doppler and Cardiac Doppler (Both Spectral and Color            Flow Doppler were utilized during procedure). Indications:    Atrial Fibrillation  History:        Patient has prior history of Echocardiogram examinations, most                 recent 03/24/2020. CAD, Prior CABG, Arrythmias:Atrial                 Fibrillation; Risk Factors:Hypertension, Diabetes and                 Dyslipidemia.  Sonographer:    Kip Peon RDCS Referring Phys: [id number] Nicholaos Schippers IMPRESSIONS  1. Left ventricular ejection fraction, by estimation, is 55 to 60%. The left ventricle has normal function. The left ventricle has no regional wall motion abnormalities. Left ventricular diastolic parameters were normal.  2. Right ventricular systolic function is mildly reduced. The right ventricular size is mildly enlarged.  3. The mitral valve is grossly normal. Trivial mitral valve regurgitation. No evidence of mitral stenosis.  4. The aortic valve is  normal in structure. There is moderate calcification of the aortic valve. There is moderate thickening of the aortic valve. Aortic valve regurgitation is not visualized. No aortic stenosis is present.  5. The inferior vena cava is normal in size with greater than 50% respiratory variability, suggesting right atrial pressure of 3 mmHg. FINDINGS  Left Ventricle: Left ventricular ejection fraction, by estimation, is 55 to 60%. The left ventricle has normal function. The left ventricle has no regional wall motion abnormalities. The left ventricular internal cavity size was normal in size. There is  no left ventricular hypertrophy. Left ventricular diastolic parameters were normal. Right Ventricle: The right ventricular size is mildly enlarged. No increase in  right ventricular wall thickness. Right ventricular systolic function is mildly reduced. Left Atrium: Left atrial size was normal in size. Right Atrium: Right atrial size was normal in size. Pericardium: There is no evidence of pericardial effusion. Mitral Valve: The mitral valve is grossly normal. There is mild thickening of the mitral valve leaflet(s). Mild mitral annular calcification. Trivial mitral valve regurgitation. No evidence of mitral valve stenosis. Tricuspid Valve: The tricuspid valve is normal in structure. Tricuspid valve regurgitation is not demonstrated. No evidence of tricuspid stenosis. Aortic Valve: The aortic valve is normal in structure. There is moderate calcification of the aortic valve. There is moderate thickening of the aortic valve. There is moderate aortic valve annular calcification. Aortic valve regurgitation is not visualized. No aortic stenosis is present. Pulmonic Valve: The pulmonic valve was normal in structure. Pulmonic valve regurgitation is not visualized. No evidence of pulmonic stenosis. Aorta: The aortic root is normal in size and structure. Venous: The inferior vena cava is normal in size with greater than 50% respiratory  variability, suggesting right atrial pressure of 3 mmHg. IAS/Shunts: No atrial level shunt detected by color flow Doppler.  LEFT VENTRICLE PLAX 2D LVIDd:         4.40 cm   Diastology LVIDs:         3.20 cm   LV e' medial:    6.08 cm/s LV PW:         1.10 cm   LV E/e' medial:  18.4 LV IVS:        0.90 cm   LV e' lateral:   10.80 cm/s LVOT diam:     1.90 cm   LV E/e' lateral: 10.4 LV SV:         54 LV SV Index:   35 LVOT Area:     2.84 cm  RIGHT VENTRICLE            IVC RV S prime:     8.11 cm/s  IVC diam: 1.80 cm TAPSE (M-mode): 1.6 cm LEFT ATRIUM             Index LA diam:        4.00 cm 2.57 cm/m LA Vol (A2C):   35.8 ml 22.98 ml/m LA Vol (A4C):   31.9 ml 20.48 ml/m LA Biplane Vol: 36.9 ml 23.69 ml/m  AORTIC VALVE LVOT Vmax:   76.00 cm/s LVOT Vmean:  54.000 cm/s LVOT VTI:    0.191 m  AORTA Ao Root diam: 2.80 cm Ao Asc diam:  2.70 cm MITRAL VALVE MV Area (PHT): 4.06 cm     SHUNTS MV Decel Time: 187 msec     Systemic VTI:  0.19 m MV E velocity: 112.00 cm/s  Systemic Diam: 1.90 cm MV A velocity: 110.00 cm/s MV E/A ratio:  1.02 Aditya Sabharwal Electronically signed by Alwin Baars Signature Date/Time: 02/13/2024/6:46:39 PM    Final    DG Chest Port 1 View Result Date: 02/12/2024 CLINICAL DATA:  Questionable sepsis. EXAM: PORTABLE CHEST 1 VIEW COMPARISON:  Chest radiograph dated 07/12/2023 FINDINGS: Similar positioning of the left Port-A-Cath. There is diffuse interstitial coarsening. Faint bibasilar densities, likely atelectasis. Developing infiltrate in the right infrahilar region is not excluded. No pleural effusion or pneumothorax the cardiac silhouette. Median sternotomy wires and CABG vascular clips. No acute osseous pathology. IMPRESSION: Bibasilar atelectasis. Developing infiltrate in the right infrahilar region is not excluded. Electronically Signed   By: Angus Bark M.D.   On: 02/12/2024 18:31   CT ABDOMEN PELVIS WO CONTRAST  Result Date: 02/12/2024 CLINICAL DATA:  Sepsis EXAM: CT ABDOMEN  AND PELVIS WITHOUT CONTRAST TECHNIQUE: Multidetector CT imaging of the abdomen and pelvis was performed following the standard protocol without IV contrast. RADIATION DOSE REDUCTION: This exam was performed according to the departmental dose-optimization program which includes automated exposure control, adjustment of the mA and/or kV according to patient size and/or use of iterative reconstruction technique. COMPARISON:  CT of the abdomen pelvis performed Jan 30, 2024 FINDINGS: Lower chest: Interval development of patchy multifocal airspace consolidation in the right middle lobe and right lower lobes. Hepatobiliary: A biliary stent is present. Small volume perihepatic ascites. Limited visualization of liver metastasis secondary to the absence of intravenous contrast agent. Pancreas: Nothing significant. Spleen: Nothing significant. Adrenals/Urinary Tract: No hydronephrosis. Vascular calcifications in both kidneys. The urinary bladder contains a small focus of air. Stomach/Bowel: The stomach is nondilated. A moderate volume of stool material is present throughout the colon. Vascular/Lymphatic: Extensive calcific atherosclerosis is present. No abdominal aortic aneurysm. Reproductive: Uterus and bilateral adnexa are unremarkable. Other: Nothing significant. Musculoskeletal: Not significantly changed. IMPRESSION: 1. Interval development of multifocal airspace infiltrates in the right middle lobe and right lower lobe, compatible with pneumonia. 2. A moderate to large volume of stool material is present throughout the colon which may represent sequelae of constipation. 3. Similar appearance of internal biliary stent. Electronically Signed   By: Reagan Camera M.D.   On: 02/12/2024 16:27       LOS: 2 days   Zeva Leber Lyndon Santiago  [facility] Pager on [email or web address]  02/14/2024, 10:20 AM

## 2024-02-14 NOTE — Evaluation (Addendum)
 Occupational Therapy Evaluation Patient Details Name: [name] MRN: [id number] DOB: 1941-09-23 Today's Date: 02/14/2024   History of Present Illness   Patient is a 83 year old female with AMS, severe sepsis, pneumonia. PMH: gallbladder cancer with radiation treatment, HTN, DM2, CAD, PAD, s/p CABG, bilateral TMA's.     Clinical Impressions Pt admitted based on above, and was seen based on problem list below. PTA pt was living with her daughter and was independent with ADLs and IADLs. Today pt is requiring set up  to mod assist for bed level ADLs. Bed mobility was  mod assist. Pt initially agreeable to mobilize, but fatigues fast. Pt frustrated with limitation and decreased strength. Pt unable to maintain sitting EOB <5 minutes d/t fatigue levels. Pt would benefit from [facility], to regain strength and activity tolerance. OT will continue to follow acutely to maximize functional independence.        If plan is discharge home, recommend the following:   A lot of help with walking and/or transfers;A lot of help with bathing/dressing/bathroom;Assistance with feeding     Functional Status Assessment   Patient has had a recent decline in their functional status and demonstrates the ability to make significant improvements in function in a reasonable and predictable amount of time.     Equipment Recommendations   Tub/shower bench      Precautions/Restrictions   Precautions Precautions: Fall Recall of Precautions/Restrictions: Intact Restrictions Weight Bearing Restrictions Per Provider Order: No     Mobility Bed Mobility Overal bed mobility: Needs Assistance Bed Mobility: Supine to Sit, Sit to Supine, Rolling Rolling: Min assist, Used rails   Supine to sit: Mod assist, HOB elevated, Used rails Sit to supine: Min assist, HOB elevated, Used rails   General bed mobility comments: Assistance for trunk to sit, and legs to return to supine, assist to scoot EOB     Transfers Overall transfer level: Needs assistance       General transfer comment: pt declined due to fatigue and generalized weakness.      Balance Overall balance assessment: Needs assistance Sitting-balance support: Feet supported, Single extremity supported Sitting balance-Leahy Scale: Fair Sitting balance - Comments: Requiring UE support to maintain EOB       ADL either performed or assessed with clinical judgement   ADL Overall ADL's : Needs assistance/impaired Eating/Feeding: Bed level;Set up   Grooming: Set up;Bed level   Upper Body Bathing: Set up;Bed level   Lower Body Bathing: Moderate assistance;Bed level   Upper Body Dressing : Set up;Bed level   Lower Body Dressing: Moderate assistance;Bed level       General ADL Comments: Pt has sitting balance for ADLs EOB, but not activity tolerance complete EOB     Vision Baseline Vision/History: 0 No visual deficits Vision Assessment?: No apparent visual deficits            Pertinent Vitals/Pain Pain Assessment Pain Assessment: No/denies pain     Extremity/Trunk Assessment Upper Extremity Assessment Upper Extremity Assessment: Right hand dominant;Generalized weakness   Lower Extremity Assessment Lower Extremity Assessment: Defer to PT evaluation   Cervical / Trunk Assessment Cervical / Trunk Assessment: Kyphotic   Communication Communication Communication: No apparent difficulties   Cognition Arousal: Lethargic Behavior During Therapy: Flat affect Cognition: No apparent impairments     OT - Cognition Comments: Pt lethargic but oriented, answering PLOF questions accurately per daughter pt at baseline cog         Following commands: Impaired Following commands impaired: Follows one  step commands with increased time     Cueing  General Comments   Cueing Techniques: Verbal cues  Pt maintaining O2 stats on RA, but reporting exhausion sitting EOB           Home Living Family/patient  expects to be discharged to:: Private residence Living Arrangements: Children Available Help at Discharge: Family;Available PRN/intermittently Type of Home: House Home Access: Stairs to enter [employer] of Steps: 1 Entrance Stairs-Rails: Right Home Layout: One level     Bathroom Shower/Tub: Chief Strategy Officer: Handicapped height Bathroom Accessibility: Yes How Accessible: Accessible via walker Home Equipment: Rolling Walker (2 wheels);Cane - single point;BSC/3in1;Grab bars - tub/shower          Prior Functioning/Environment Prior Level of Function : Independent/Modified Independent;Driving       Mobility Comments: no AD      OT Problem List: Decreased strength;Decreased range of motion;Decreased activity tolerance;Impaired balance (sitting and/or standing)   OT Treatment/Interventions: Self-care/ADL training;Therapeutic exercise;Energy conservation;DME and/or AE instruction;Therapeutic activities;Patient/family education;Balance training      OT Goals(Current goals can be found in the care plan section)   Acute Rehab OT Goals Patient Stated Goal: To rest OT Goal Formulation: With patient Time For Goal Achievement: 02/28/24 Potential to Achieve Goals: Fair   OT Frequency:  Min 2X/week       AM-PAC OT "6 Clicks" Daily Activity     Outcome Measure Help from another person eating meals?: A Little Help from another person taking care of personal grooming?: A Little Help from another person toileting, which includes using toliet, bedpan, or urinal?: Total Help from another person bathing (including washing, rinsing, drying)?: A Lot Help from another person to put on and taking off regular upper body clothing?: A Little Help from another person to put on and taking off regular lower body clothing?: A Lot 6 Click Score: 14   End of Session Nurse Communication: Mobility status  Activity Tolerance: Patient limited by fatigue Patient left:  in bed;with call bell/phone within reach;with bed alarm set;with family/visitor present  OT Visit Diagnosis: Muscle weakness (generalized) (M62.81);Unsteadiness on feet (R26.81);Other abnormalities of gait and mobility (R26.89)                Time: 4098-1191 OT Time Calculation (min): 28 min Charges:  OT General Charges $OT Visit: 1 Visit OT Evaluation $OT Eval Moderate Complexity: 1 Mod OT Treatments $Self Care/Home Management : 8-22 mins  Delmer Ferraris, OT  Acute Rehabilitation Services Office [phone] Secure chat preferred   Mickael Alamo 02/14/2024, 3:50 PM

## 2024-02-15 DIAGNOSIS — A419 Sepsis, unspecified organism: Secondary | ICD-10-CM | POA: Diagnosis not present

## 2024-02-15 DIAGNOSIS — N179 Acute kidney failure, unspecified: Secondary | ICD-10-CM | POA: Diagnosis not present

## 2024-02-15 DIAGNOSIS — R7401 Elevation of levels of liver transaminase levels: Secondary | ICD-10-CM | POA: Diagnosis not present

## 2024-02-15 DIAGNOSIS — J189 Pneumonia, unspecified organism: Secondary | ICD-10-CM | POA: Diagnosis not present

## 2024-02-15 LAB — CBC
HCT: 36.8 % (ref 36.0–46.0)
Hemoglobin: 12.3 g/dL (ref 12.0–15.0)
MCH: 34.4 pg — ABNORMAL HIGH (ref 26.0–34.0)
MCHC: 33.4 g/dL (ref 30.0–36.0)
MCV: 102.8 fL — ABNORMAL HIGH (ref 80.0–100.0)
Platelets: 105 10*3/uL — ABNORMAL LOW (ref 150–400)
RBC: 3.58 MIL/uL — ABNORMAL LOW (ref 3.87–5.11)
RDW: 17.8 % — ABNORMAL HIGH (ref 11.5–15.5)
WBC: 10.4 10*3/uL (ref 4.0–10.5)
nRBC: 0 % (ref 0.0–0.2)

## 2024-02-15 LAB — URINE CULTURE: Culture: 70000 — AB

## 2024-02-15 LAB — COMPREHENSIVE METABOLIC PANEL WITH GFR
ALT: 740 U/L — ABNORMAL HIGH (ref 0–44)
AST: 755 U/L — ABNORMAL HIGH (ref 15–41)
Albumin: 2 g/dL — ABNORMAL LOW (ref 3.5–5.0)
Alkaline Phosphatase: 190 U/L — ABNORMAL HIGH (ref 38–126)
Anion gap: 10 (ref 5–15)
BUN: 35 mg/dL — ABNORMAL HIGH (ref 8–23)
CO2: 22 mmol/L (ref 22–32)
Calcium: 9.4 mg/dL (ref 8.9–10.3)
Chloride: 108 mmol/L (ref 98–111)
Creatinine, Ser: 0.91 mg/dL (ref 0.44–1.00)
GFR, Estimated: >60 mL/min (ref >60)
Glucose, Bld: 182 mg/dL — ABNORMAL HIGH (ref 70–99)
Potassium: 3.9 mmol/L (ref 3.5–5.1)
Sodium: 140 mmol/L (ref 135–145)
Total Bilirubin: 0.8 mg/dL (ref 0.0–1.2)
Total Protein: 4.8 g/dL — ABNORMAL LOW (ref 6.5–8.1)

## 2024-02-15 LAB — GLUCOSE, CAPILLARY
Glucose-Capillary: 96 mg/dL (ref 70–99)
Glucose-Capillary: 163 mg/dL — ABNORMAL HIGH (ref 70–99)
Glucose-Capillary: 148 mg/dL — ABNORMAL HIGH (ref 70–99)
Glucose-Capillary: 128 mg/dL — ABNORMAL HIGH (ref 70–99)

## 2024-02-15 MED ORDER — SENNOSIDES-DOCUSATE SODIUM 8.6-50 MG PO TABS
2.0000 | ORAL_TABLET | Freq: Two times a day (BID) | ORAL | Status: DC
  Administered 2024-02-15: 2 via ORAL
  Filled 2024-02-15 (×2): qty 2

## 2024-02-15 MED ORDER — POLYETHYLENE GLYCOL 3350 17 G PO PACK
17.0000 g | PACK | Freq: Every day | ORAL | Status: DC
  Administered 2024-02-15 – 2024-02-17 (×3): 17 g via ORAL
  Filled 2024-02-15 (×3): qty 1

## 2024-02-15 MED ORDER — TRAZODONE HCL 50 MG PO TABS
50.0000 mg | ORAL_TABLET | Freq: Every evening | ORAL | Status: DC | PRN
  Administered 2024-02-17: 50 mg via ORAL
  Filled 2024-02-15: qty 1

## 2024-02-15 NOTE — Progress Notes (Signed)
 Occupational Therapy Treatment Patient Details Name: MARVALENE BARRETT MRN: [id number] DOB: [birth year] Today's Date: 02/15/2024   History of present illness Patient is a 83 year old female with AMS, severe sepsis, pneumonia. PMH: gallbladder cancer with radiation treatment, HTN, DM2, CAD, PAD, s/p CABG, bilateral TMA's.   OT comments  Pt. Seen for skilled OT treatment session with dtr. Present.  Pt. Able to complete bed mobility with CGA and cues for sequencing and use of rails.  Sit/stand x2 in prep for 3n1 transfers with MOD A and cues for hand placement when sitting back down.  Pt. And dtr. Pleased with pt.s progress and improved activity tolerance for today's session.  Cont. With acute OT POC.        If plan is discharge home, recommend the following:  A lot of help with walking and/or transfers;A lot of help with bathing/dressing/bathroom;Assistance with feeding   Equipment Recommendations  Tub/shower bench    Recommendations for Other Services      Precautions / Restrictions Precautions Precautions: Fall Recall of Precautions/Restrictions: Intact       Mobility Bed Mobility Overal bed mobility: Needs Assistance Bed Mobility: Rolling, Sidelying to Sit, Sit to Sidelying Rolling: Contact guard assist, Used rails Sidelying to sit: Contact guard assist     Sit to sidelying: Contact guard assist General bed mobility comments: cues for hand placement and use of bed rails, but able to roll, push through elbow and sit eob, for back to bed back into side lying and brought BLEs into bed without assistance.  (exited from R side, use of rails HOB flat, pt. also does not use a pillow under her head)    Transfers Overall transfer level: Needs assistance Equipment used: Rolling walker (2 wheels) Transfers: Sit to/from Stand Sit to Stand: Mod assist           General transfer comment: sit/stand x2.  1st stand, did not alert and sat and did not reach back. reviewed tech. and rationale  for letting us  know when ready to sit and checking if legs touching the surface and reaching back to sit. 2nd stand pt. was able to apply recommendations and did great! able to scoot x4 towards hob prior to laying back down.  when sitting eob initial ue support but then able to sit un supported as session progressed     Balance                                           ADL either performed or assessed with clinical judgement   ADL Overall ADL's : Needs assistance/impaired                       Lower Body Dressing Details (indicate cue type and reason): declined need for practice, states family available to assist prn, dtr present and agreed               General ADL Comments: bed mobility, scooting, and sit/stands in prep for increased mobility and bsc transfers    Extremity/Trunk Assessment              Vision       Perception     Praxis     Communication Communication Communication: No apparent difficulties   Cognition Arousal: Alert Behavior During Therapy: WFL for tasks assessed/performed Cognition: No apparent impairments  Following commands: Intact Following commands impaired: Only follows one step commands consistently      Cueing   Cueing Techniques: Verbal cues  Exercises      Shoulder Instructions       General Comments      Pertinent Vitals/ Pain       Pain Assessment Pain Assessment: No/denies pain  Home Living                                          Prior Functioning/Environment              Frequency  Min 2X/week        Progress Toward Goals  OT Goals(current goals can now be found in the care plan section)  Progress towards OT goals: Progressing toward goals     Plan      Co-evaluation                 AM-PAC OT "6 Clicks" Daily Activity     Outcome Measure   Help from another person eating meals?: A Little Help from  another person taking care of personal grooming?: A Little Help from another person toileting, which includes using toliet, bedpan, or urinal?: Total Help from another person bathing (including washing, rinsing, drying)?: A Lot Help from another person to put on and taking off regular upper body clothing?: A Little Help from another person to put on and taking off regular lower body clothing?: A Lot 6 Click Score: 14    End of Session Equipment Utilized During Treatment: Gait belt;Rolling walker (2 wheels)  OT Visit Diagnosis: Muscle weakness (generalized) (M62.81);Unsteadiness on feet (R26.81);Other abnormalities of gait and mobility (R26.89)   Activity Tolerance Patient tolerated treatment well   Patient Left in bed;with call bell/phone within reach;with bed alarm set;with family/visitor present   Nurse Communication Other (comment) (secure chat with rn pt.s dtr question/request for scds and also need for new pure wik placement)        Time: 6295-2841 OT Time Calculation (min): 21 min  Charges: OT General Charges $OT Visit: 1 Visit OT Treatments $Therapeutic Activity: 8-22 mins  Howell Macintosh, COTA/L Acute Rehabilitation [phone]   Leory Rands [place]-COTA/L  02/15/2024, 4:33 PM

## 2024-02-15 NOTE — Progress Notes (Signed)
 Pt requested something to help with sleeping, sent message to provider, see new order.

## 2024-02-15 NOTE — Progress Notes (Signed)
 [facility] PROGRESS NOTE   [name]:[id number] DOB: [birth year] DOA: 02/12/2024  PCP: Ransom Byers, MD  Brief History: 83 y.o. female with a past medical history of essential hypertension, diabetes mellitus type 2, coronary artery disease, peripheral artery disease status post multiple interventions previously, status post CABG previously who is under the care of cancer Center for history of gallbladder cancer.  She has completed radiation treatment.  She has also continue treatment with the capecitabine .  She continues to get treatment with durvalumab .  She completed radiation treatment in April.  Her last infusion with the durvalumab  was on April 23.  Patient missed her appointments at the cancer center.  When daughter went to check on her she was lying on the bed and was noted to be confused.  She was brought into the hospital.  She was noted to be febrile.  Noted to have pneumonia.  She was hospitalized for further management.     Consultants: None  Procedures: None    Subjective/Interval History: Patient mentions that she feels well this morning.  Could not sleep well last night but denies any pain this morning.  Feels stronger.  Has been urinating well.  No bowel movements in the last 24 hours.  Passing gas though.  Tolerating her diet.  Daughter is at the bedside.   Assessment/Plan:   Pneumonia most likely aspiration pneumonia with severe sepsis, present on admission Patient was febrile in the ED with elevated WBC.  She also had lactic acidosis with initial level of 7.8.  Improvement noted. COVID-19 influenza and RSV PCR negative.  CT suggested right lung pneumonia.  Patient likely aspirated as she mentioned episodes of nausea and vomiting few days prior to admission. Patient was quite tachycardic at admission.  She met criteria for severe sepsis due to AKI. Patient feels better.  Has been weaned off of oxygen . WBC is improving.  Labs are pending from this  morning. Antibiotics were changed over to ceftriaxone  and doxycycline .  Blood cultures negative so far.  Surprisingly urine culture grew 70,000 colonies of Klebsiella.  However she never had any symptoms suggesting UTI.  Ceftriaxone  should cover this organism. She remains quite deconditioned.  Seen by PT and OT.  Home health is recommended but she does live by herself so the family would like for her mobility to improve some more before she goes home.  Otherwise they are open to the idea of short-term rehab.   Acute kidney injury with metabolic acidosis/hypokalemia This is in the setting of nausea vomiting.  Noted to be on diuretic and ARB at home.  Also was on metformin  at home.   Patient was hydrated.  Had improvement in her renal function.  Labs are pending from this morning.     Diabetes mellitus type 2 Noted to be on metformin  Jardiance  and Prandin  prior to admission.  No longer takes NovoLog .   HbA1c 4.9. Monitor CBGs.   Paroxysmal atrial fibrillation with RVR No previous history of atrial fibrillation.  Likely triggered by sepsis.   TSH noted to be 0.112.  Free T4 is 1.16.  No change in treatment plan.  Will recommend thyroid  function test be rechecked in a few weeks. Echocardiogram shows normal systolic function with EF of 55 to 60%. CHADS2 vascular score is 6.  Anticoagulation discussed.  Patient was on low-dose Xarelto  previously.  Agreeable to therapeutic dose Xarelto .  Will change to Xarelto  once LFTs start improving.  Will discontinue aspirin  at that time.  Continue  therapeutic Lovenox  for now. Continue metoprolol .  Remains in sinus rhythm. Outpatient follow-up with cardiology.  She is followed by Dr. Katheryne Pane.  Transaminitis LFTs continue to worsen.  Likely due to severe sepsis.  Bilirubin and alkaline phosphatase levels normal.   CT scan of the abdomen done at the time of admission did not show any abnormality in the hepatobiliary system.  She does have a history of gallbladder  cancer.   She underwent a right upper quadrant ultrasound with Doppler study which was negative. Await labs from today.  Abdomen remains benign. Statin is currently on hold. Recently checked hepatitis panel in 2024 was unremarkable.  Gallbladder cancer Followed by medical oncology.  Has completed radiation treatment.  Remains on immune treatment.  Holding these medications for now.   She does have a biliary stent.  Bilirubin is normal.  Elevated AST ALT probably due to sepsis.  Continue to trend.   Coronary artery disease status post CABG Cardiac status is stable.  Continue to monitor.   Peripheral artery disease She has had angioplasties to lower extremity blood vessels previously.  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  Currently not an active issue.    Constipation CT of the abdomen pelvis suggest significant constipation.  TSH noted to be low. Has had multiple bowel movement with bowel regimen.  For some reason her laxatives were discontinued.  They have been resumed today.  DVT Prophylaxis: Full dose Lovenox  Code Status: Full code Family Communication: Discussed with patient and her daughter Disposition Plan: Home health is recommended     Medications: Scheduled:  aspirin  EC  81 mg Oral QHS   brimonidine   1 drop Right Eye BID   doxycycline   100 mg Oral Q12H   enoxaparin  (LOVENOX ) injection  50 mg Subcutaneous BID   insulin  aspart  0-9 Units Subcutaneous TID WC   metoprolol  tartrate  25 mg Oral BID   Continuous:  cefTRIAXone  (ROCEPHIN )  IV 2 g (02/15/24 1119)   PRN:bismuth subsalicylate, ondansetron  **OR** ondansetron  (ZOFRAN ) IV, traZODone   Antibiotics: Anti-infectives (From admission, onward)    Start     Dose/Rate Route Frequency Ordered Stop   02/14/24 1145  cefTRIAXone  (ROCEPHIN ) 2 g in sodium chloride  0.9 % 100 mL IVPB        2 g 200 mL/hr over 30 Minutes Intravenous Every 24 hours 02/14/24 1048 02/19/24 1144   02/14/24 1145  doxycycline   (VIBRA -TABS) tablet 100 mg        100 mg Oral Every 12 hours 02/14/24 1048 02/16/24 2159   02/13/24 1400  ceFEPIme (MAXIPIME) 2 g in sodium chloride  0.9 % 100 mL IVPB  Status:  Discontinued        2 g 200 mL/hr over 30 Minutes Intravenous Every 24 hours 02/12/24 1746 02/14/24 1048   02/13/24 1400  vancomycin  (VANCOREADY) IVPB 500 mg/100 mL  Status:  Discontinued        500 mg 100 mL/hr over 60 Minutes Intravenous Every 24 hours 02/13/24 1321 02/14/24 1048   02/12/24 1746  vancomycin  variable dose per unstable renal function (pharmacist dosing)  Status:  Discontinued         Does not apply See admin instructions 02/12/24 1746 02/13/24 1321   02/12/24 1730  vancomycin  (VANCOCIN ) IVPB 1000 mg/200 mL premix  Status:  Discontinued        1,000 mg 200 mL/hr over 60 Minutes Intravenous  Once 02/12/24 1722 02/12/24 1722   02/12/24 1730  ceFEPIme (MAXIPIME) 2 g in sodium chloride  0.9 %  100 mL IVPB  Status:  Discontinued        2 g 200 mL/hr over 30 Minutes Intravenous  Once 02/12/24 1722 02/12/24 1722   02/12/24 1345  ceFEPIme (MAXIPIME) 2 g in sodium chloride  0.9 % 100 mL IVPB        2 g 200 mL/hr over 30 Minutes Intravenous  Once 02/12/24 1332 02/12/24 1439   02/12/24 1345  metroNIDAZOLE  (FLAGYL ) IVPB 500 mg        500 mg 100 mL/hr over 60 Minutes Intravenous  Once 02/12/24 1332 02/12/24 1600   02/12/24 1345  vancomycin  (VANCOCIN ) IVPB 1000 mg/200 mL premix        1,000 mg 200 mL/hr over 60 Minutes Intravenous  Once 02/12/24 1332 02/12/24 1541       Objective:  Vital Signs  Vitals:   02/14/24 1212 02/14/24 1505 02/15/24 0532 02/15/24 0756  BP: (!) 128/43 (!) 132/47 (!) 186/66 (!) 181/70  Pulse: 70 76 90 94  Resp: 14 16 14 17   Temp: 98.1 F (36.7 C) 97.6 F (36.4 C) 97.7 F (36.5 C) (!) 97.5 F (36.4 C)  TempSrc: Oral Oral Oral Oral  SpO2: 98% 97% 98% 100%  Weight:      Height:        Intake/Output Summary (Last 24 hours) at 02/15/2024 1131 Last data filed at 02/14/2024  1520 Gross per 24 hour  Intake 100 ml  Output --  Net 100 ml   Filed Weights   02/12/24 1407 02/13/24 1115  Weight: 51.7 kg 51.7 kg    General appearance: Awake alert.  In no distress Resp: Clear to auscultation bilaterally.  Normal effort Cardio: S1-S2 is normal regular.  No S3-S4.  No rubs murmurs or bruit GI: Abdomen is soft.  Nontender nondistended.  Bowel sounds are present normal.  No masses organomegaly Extremities: No edema.  Physical deconditioning is noted. Neurologic: Alert and oriented x3.  No focal neurological deficits.     Lab Results:  Data Reviewed: I have personally reviewed following labs and reports of the imaging studies  CBC: Recent Labs  Lab 02/12/24 1330 02/13/24 0608 02/14/24 0901  WBC 14.0* 17.7* 13.4*  NEUTROABS 12.9*  --   --   HGB 12.0 11.3* 11.8*  HCT 35.8* 34.2* 34.3*  MCV 105.0* 104.6* 101.5*  PLT 224 147* 119*    Basic Metabolic Panel: Recent Labs  Lab 02/12/24 1330 02/12/24 1838 02/13/24 0608 02/14/24 0901  NA 137 137 136 140  K 4.0 3.7 3.4* 3.9  CL 105 106 106 111  CO2 11* 17* 19* 22  GLUCOSE 132* 197* 138* 78  BUN 81* 74* 65* 46*  CREATININE 2.31* 1.93* 1.49* 1.08*  CALCIUM  10.2 9.7 9.6 9.2    GFR: Estimated Creatinine Clearance: 33.3 mL/min (A) (by C-G formula based on SCr of 1.08 mg/dL (H)).  Liver Function Tests: Recent Labs  Lab 02/12/24 1330 02/13/24 0608 02/14/24 0901  AST 264* 639* 1,133*  ALT 86* 281* 674*  ALKPHOS 84 75 107  BILITOT 0.9 1.0 0.7  PROT 5.5* 4.9* 4.6*  ALBUMIN  2.4* 2.1* 1.8*    Coagulation Profile: Recent Labs  Lab 02/12/24 1330 02/13/24 0608  INR 2.1* 2.1*    HbA1C: Recent Labs    02/13/24 0608  HGBA1C 4.9    CBG: Recent Labs  Lab 02/14/24 0729 02/14/24 1209 02/14/24 1503 02/14/24 1945 02/15/24 0754  GLUCAP 81 132* 115* 119* 96    Thyroid  Function Tests: Recent Labs    02/13/24 4742  02/14/24 0901  TSH 0.112*  --   FREET4  --  1.16*   Recent Results  (from the past 240 hours)  Blood Culture (routine x 2)     Status: None (Preliminary result)   Collection Time: 02/12/24  1:25 PM   Specimen: BLOOD RIGHT FOREARM  Result Value Ref Range Status   Specimen Description BLOOD RIGHT FOREARM  Final   Special Requests   Final    BOTTLES DRAWN AEROBIC AND ANAEROBIC Blood Culture adequate volume   Culture   Final    NO GROWTH 3 DAYS Performed at [facility], [address] [place], [place] [address]    Report Status PENDING  Incomplete  Blood Culture (routine x 2)     Status: None (Preliminary result)   Collection Time: 02/12/24  1:32 PM   Specimen: BLOOD LEFT FOREARM  Result Value Ref Range Status   Specimen Description BLOOD LEFT FOREARM  Final   Special Requests   Final    BOTTLES DRAWN AEROBIC AND ANAEROBIC Blood Culture adequate volume   Culture   Final    NO GROWTH 3 DAYS Performed at [facility], [address] [place], [place] [address]    Report Status PENDING  Incomplete  Resp panel by RT-PCR (RSV, Flu A&B, Covid) Anterior Nasal Swab     Status: None   Collection Time: 02/12/24  1:39 PM   Specimen: Anterior Nasal Swab  Result Value Ref Range Status   SARS Coronavirus 2 by RT PCR NEGATIVE NEGATIVE Final   Influenza A by PCR NEGATIVE NEGATIVE Final   Influenza B by PCR NEGATIVE NEGATIVE Final    Comment: (NOTE) The Xpert Xpress SARS-CoV-2/FLU/RSV plus assay is intended as an aid in the diagnosis of influenza from Nasopharyngeal swab specimens and should not be used as a sole basis for treatment. Nasal washings and aspirates are unacceptable for Xpert Xpress SARS-CoV-2/FLU/RSV testing.  Fact Sheet for Patients: BloggerCourse.com  Fact Sheet for Healthcare Providers: SeriousBroker.it  This test is not yet approved or cleared by the [place]  FDA and has been authorized for detection and/or diagnosis of SARS-CoV-2 by FDA under an Emergency Use  Authorization (EUA). This EUA will remain in effect (meaning this test can be used) for the duration of the COVID-19 declaration under Section 564(b)(1) of the Act, 21 U.S.C. section 360bbb-3(b)(1), unless the authorization is terminated or revoked.     Resp Syncytial Virus by PCR NEGATIVE NEGATIVE Final    Comment: (NOTE) Fact Sheet for Patients: BloggerCourse.com  Fact Sheet for Healthcare Providers: SeriousBroker.it  This test is not yet approved or cleared by the [place]  FDA and has been authorized for detection and/or diagnosis of SARS-CoV-2 by FDA under an Emergency Use Authorization (EUA). This EUA will remain in effect (meaning this test can be used) for the duration of the COVID-19 declaration under Section 564(b)(1) of the Act, 21 U.S.C. section 360bbb-3(b)(1), unless the authorization is terminated or revoked.  Performed at [facility], [address], [place], [place] [address]   Urine Culture     Status: Abnormal   Collection Time: 02/12/24  1:50 PM   Specimen: Urine, Random  Result Value Ref Range Status   Specimen Description URINE, RANDOM  Final   Special Requests   Final    NONE Reflexed from [id number] Performed at [facility], [address], [place], [place] [address]    Culture 70,000 COLONIES/mL KLEBSIELLA PNEUMONIAE (A)  Final  Report Status 02/15/2024 FINAL  Final   Organism ID, Bacteria KLEBSIELLA PNEUMONIAE (A)  Final      Susceptibility   Klebsiella pneumoniae - MIC*    AMPICILLIN >=32 RESISTANT Resistant     CEFAZOLIN  <=4 SENSITIVE Sensitive     CEFEPIME <=0.12 SENSITIVE Sensitive     CEFTRIAXONE  <=0.25 SENSITIVE Sensitive     CIPROFLOXACIN  <=0.25 SENSITIVE Sensitive     GENTAMICIN <=1 SENSITIVE Sensitive     IMIPENEM <=0.25 SENSITIVE Sensitive     NITROFURANTOIN <=16 SENSITIVE Sensitive     TRIMETH /SULFA  <=20 SENSITIVE Sensitive     AMPICILLIN/SULBACTAM 4 SENSITIVE Sensitive      PIP/TAZO <=4 SENSITIVE Sensitive ug/mL    * 70,000 COLONIES/mL KLEBSIELLA PNEUMONIAE      Radiology Studies: US  ABDOMEN LIMITED WITH LIVER DOPPLER Result Date: 02/14/2024 CLINICAL DATA:  Transaminitis EXAM: DUPLEX ULTRASOUND OF LIVER TECHNIQUE: Color and duplex Doppler ultrasound was performed to evaluate the hepatic in-flow and out-flow vessels. COMPARISON:  02/12/2024 FINDINGS: Liver: Heterogeneous increased liver echotexture. 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 Normal hepatic contour without nodularity. No intrahepatic biliary duct dilation. Main Portal Vein size: 1.0 cm Portal Vein Velocities Main Prox:  24.2 cm/sec Main Mid: 22.7 cm/sec Main Dist:  27.3 cm/sec Right: 16.2 cm/sec Left: 9.8 cm/sec Hepatic Vein Velocities Right:  28.6 cm/sec Middle:  47.2 cm/sec Left:  35.0 cm/sec IVC: Present and patent with normal respiratory phasicity. Hepatic Artery Velocity:  41.3 cm/sec Splenic Vein Velocity:  22.3 cm/sec Spleen: 10.3 cm x 4.6 cm x 3.7 cm with a total volume of 92.6 cm^3 (411 cm^3 is upper limit normal) Portal Vein Occlusion/Thrombus: No Splenic Vein Occlusion/Thrombus: No Ascites: Present, trace Varices: None IMPRESSION: 1. Heterogeneous increased liver echotexture. 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 2. Normal hepatic Doppler. 3. Trace ascites. Electronically Signed   By: Bobbye Burrow M.D.   On: 02/14/2024 16:02   CT CHEST WO CONTRAST Result Date: 02/13/2024 CLINICAL DATA:  Known right basilar infiltrate EXAM: CT CHEST WITHOUT CONTRAST TECHNIQUE: Multidetector CT imaging of the chest was performed following the standard protocol without IV contrast. RADIATION DOSE REDUCTION: This exam was performed according to the departmental dose-optimization program which includes automated exposure control, adjustment of the mA and/or kV according to patient size and/or use of iterative reconstruction  technique. COMPARISON:  CT from the previous day, 09/27/2023 FINDINGS: Cardiovascular: Somewhat limited due to lack of IV contrast. Atherosclerotic calcifications of the aorta are noted without aneurysmal dilatation. Coronary bypass grafting is seen as well as heavy coronary calcifications. Left chest wall port is noted in satisfactory position. Mediastinum/Nodes: Thoracic inlet is within normal limits. No sizable hilar or mediastinal adenopathy is noted. A few small likely reactive nodes are seen. Lungs/Pleura: Lungs are well aerated bilaterally. Mild infiltrate is noted in the medial aspect of the left lower lobe. More marked infiltrate is noted in the right lower lobe and right middle lobe similar to that seen on the prior exam. A few small nodular changes are noted within the right upper lobe which are stable in appearance from January of 2025 as well as October of 2024. No new significant nodules are noted. Upper Abdomen: Visualized upper abdomen shows a biliary stent and mild pneumobilia. Mild ascites surrounding the liver is seen. This is new from the prior exam. Musculoskeletal: No chest wall mass or suspicious bone lesions identified. IMPRESSION: Bibasilar infiltrates right greater than left described. Multiple  pulmonary nodules are noted in the right upper lobe stable from the prior studies. Mild abdominal ascites surrounding liver. Electronically Signed   By: Violeta Grey M.D.   On: 02/13/2024 21:33   ECHOCARDIOGRAM COMPLETE Result Date: 02/13/2024    ECHOCARDIOGRAM REPORT   Patient Name:   [name] Date of Exam: 02/13/2024 Medical Rec #:  [id number]     Height:       65.0 in Accession #:    [id number]    Weight:       114.0 lb Date of Birth:  09-May-1941     BSA:          1.558 m Patient Age:    82 years      BP:           139/68 mmHg Patient Gender: F             HR:           76 bpm. Exam Location:  Inpatient Procedure: 2D Echo, Color Doppler and Cardiac Doppler (Both Spectral and Color             Flow Doppler were utilized during procedure). Indications:    Atrial Fibrillation  History:        Patient has prior history of Echocardiogram examinations, most                 recent 03/24/2020. CAD, Prior CABG, Arrythmias:Atrial                 Fibrillation; Risk Factors:Hypertension, Diabetes and                 Dyslipidemia.  Sonographer:    Kip Peon RDCS Referring Phys: [id number] IMPRESSIONS  1. Left ventricular ejection fraction, by estimation, is 55 to 60%. The left ventricle has normal function. The left ventricle has no regional wall motion abnormalities. Left ventricular diastolic parameters were normal.  2. Right ventricular systolic function is mildly reduced. The right ventricular size is mildly enlarged.  3. The mitral valve is grossly normal. Trivial mitral valve regurgitation. No evidence of mitral stenosis.  4. The aortic valve is normal in structure. There is moderate calcification of the aortic valve. There is moderate thickening of the aortic valve. Aortic valve regurgitation is not visualized. No aortic stenosis is present.  5. The inferior vena cava is normal in size with greater than 50% respiratory variability, suggesting right atrial pressure of 3 mmHg. FINDINGS  Left Ventricle: Left ventricular ejection fraction, by estimation, is 55 to 60%. The left ventricle has normal function. The left ventricle has no regional wall motion abnormalities. The left ventricular internal cavity size was normal in size. There is  no left ventricular hypertrophy. Left ventricular diastolic parameters were normal. Right Ventricle: The right ventricular size is mildly enlarged. No increase in right ventricular wall thickness. Right ventricular systolic function is mildly reduced. Left Atrium: Left atrial size was normal in size. Right Atrium: Right atrial size was normal in size. Pericardium: There is no evidence of pericardial effusion. Mitral Valve: The mitral valve is grossly normal. There is  mild thickening of the mitral valve leaflet(s). Mild mitral annular calcification. Trivial mitral valve regurgitation. No evidence of mitral valve stenosis. Tricuspid Valve: The tricuspid valve is normal in structure. Tricuspid valve regurgitation is not demonstrated. No evidence of tricuspid stenosis. Aortic Valve: The aortic valve is normal in structure. There is moderate calcification of the aortic valve. There is moderate thickening of the  aortic valve. There is moderate aortic valve annular calcification. Aortic valve regurgitation is not visualized. No aortic stenosis is present. Pulmonic Valve: The pulmonic valve was normal in structure. Pulmonic valve regurgitation is not visualized. No evidence of pulmonic stenosis. Aorta: The aortic root is normal in size and structure. Venous: The inferior vena cava is normal in size with greater than 50% respiratory variability, suggesting right atrial pressure of 3 mmHg. IAS/Shunts: No atrial level shunt detected by color flow Doppler.  LEFT VENTRICLE PLAX 2D LVIDd:         4.40 cm   Diastology LVIDs:         3.20 cm   LV e' medial:    6.08 cm/s LV PW:         1.10 cm   LV E/e' medial:  18.4 LV IVS:        0.90 cm   LV e' lateral:   10.80 cm/s LVOT diam:     1.90 cm   LV E/e' lateral: 10.4 LV SV:         54 LV SV Index:   35 LVOT Area:     2.84 cm  RIGHT VENTRICLE            IVC RV S prime:     8.11 cm/s  IVC diam: 1.80 cm TAPSE (M-mode): 1.6 cm LEFT ATRIUM             Index LA diam:        4.00 cm 2.57 cm/m LA Vol (A2C):   35.8 ml 22.98 ml/m LA Vol (A4C):   31.9 ml 20.48 ml/m LA Biplane Vol: 36.9 ml 23.69 ml/m  AORTIC VALVE LVOT Vmax:   76.00 cm/s LVOT Vmean:  54.000 cm/s LVOT VTI:    0.191 m  AORTA Ao Root diam: 2.80 cm Ao Asc diam:  2.70 cm MITRAL VALVE MV Area (PHT): 4.06 cm     SHUNTS MV Decel Time: 187 msec     Systemic VTI:  0.19 m MV E velocity: 112.00 cm/s  Systemic Diam: 1.90 cm MV A velocity: 110.00 cm/s MV E/A ratio:  1.02 Aditya Sabharwal  Electronically signed by Alwin Baars Signature Date/Time: 02/13/2024/6:46:39 PM    Final        LOS: 3 days   Maylene Spear  [facility] Pager on [email or web address]  02/15/2024, 11:31 AM

## 2024-02-15 NOTE — Plan of Care (Signed)

## 2024-02-16 DIAGNOSIS — J189 Pneumonia, unspecified organism: Secondary | ICD-10-CM | POA: Diagnosis not present

## 2024-02-16 DIAGNOSIS — R7401 Elevation of levels of liver transaminase levels: Secondary | ICD-10-CM | POA: Diagnosis not present

## 2024-02-16 LAB — CBC
HCT: 37.6 % (ref 36.0–46.0)
Hemoglobin: 12.4 g/dL (ref 12.0–15.0)
MCH: 33.9 pg (ref 26.0–34.0)
MCHC: 33 g/dL (ref 30.0–36.0)
MCV: 102.7 fL — ABNORMAL HIGH (ref 80.0–100.0)
Platelets: 121 10*3/uL — ABNORMAL LOW (ref 150–400)
RBC: 3.66 MIL/uL — ABNORMAL LOW (ref 3.87–5.11)
RDW: 17.9 % — ABNORMAL HIGH (ref 11.5–15.5)
WBC: 9.5 10*3/uL (ref 4.0–10.5)
nRBC: 0 % (ref 0.0–0.2)

## 2024-02-16 LAB — GLUCOSE, CAPILLARY
Glucose-Capillary: 150 mg/dL — ABNORMAL HIGH (ref 70–99)
Glucose-Capillary: 209 mg/dL — ABNORMAL HIGH (ref 70–99)
Glucose-Capillary: 155 mg/dL — ABNORMAL HIGH (ref 70–99)
Glucose-Capillary: 163 mg/dL — ABNORMAL HIGH (ref 70–99)

## 2024-02-16 LAB — COMPREHENSIVE METABOLIC PANEL WITH GFR
ALT: 562 U/L — ABNORMAL HIGH (ref 0–44)
AST: 375 U/L — ABNORMAL HIGH (ref 15–41)
Albumin: 2 g/dL — ABNORMAL LOW (ref 3.5–5.0)
Alkaline Phosphatase: 174 U/L — ABNORMAL HIGH (ref 38–126)
Anion gap: 8 (ref 5–15)
BUN: 28 mg/dL — ABNORMAL HIGH (ref 8–23)
CO2: 22 mmol/L (ref 22–32)
Calcium: 9.5 mg/dL (ref 8.9–10.3)
Chloride: 111 mmol/L (ref 98–111)
Creatinine, Ser: 0.91 mg/dL (ref 0.44–1.00)
GFR, Estimated: >60 mL/min (ref >60)
Glucose, Bld: 226 mg/dL — ABNORMAL HIGH (ref 70–99)
Potassium: 3.8 mmol/L (ref 3.5–5.1)
Sodium: 141 mmol/L (ref 135–145)
Total Bilirubin: 0.8 mg/dL (ref 0.0–1.2)
Total Protein: 4.8 g/dL — ABNORMAL LOW (ref 6.5–8.1)

## 2024-02-16 MED ORDER — RIVAROXABAN 15 MG PO TABS
15.0000 mg | ORAL_TABLET | Freq: Every day | ORAL | Status: DC
  Filled 2024-02-16: qty 1

## 2024-02-16 MED ORDER — ACETAMINOPHEN 160 MG/5ML PO SOLN
325.0000 mg | Freq: Once | ORAL | Status: AC
  Administered 2024-02-17: 325 mg via ORAL
  Filled 2024-02-16 (×2): qty 20.3

## 2024-02-16 MED ORDER — DOXYCYCLINE HYCLATE 100 MG PO TABS
100.0000 mg | ORAL_TABLET | Freq: Two times a day (BID) | ORAL | Status: DC
  Administered 2024-02-16 – 2024-02-18 (×4): 100 mg via ORAL
  Filled 2024-02-16 (×4): qty 1

## 2024-02-16 MED ORDER — SODIUM CHLORIDE 0.9% FLUSH: 10.0000 mL | INTRAVENOUS | Status: DC | PRN

## 2024-02-16 MED ORDER — SODIUM CHLORIDE 0.9% FLUSH
10.0000 mL | Freq: Two times a day (BID) | INTRAVENOUS | Status: DC
  Administered 2024-02-16 – 2024-02-18 (×3): 10 mL

## 2024-02-16 MED ORDER — ASPIRIN 81 MG PO CHEW
81.0000 mg | CHEWABLE_TABLET | Freq: Every day | ORAL | Status: DC
Start: 2024-02-16 — End: 2024-02-18
  Administered 2024-02-16 – 2024-02-17 (×2): 81 mg via ORAL
  Filled 2024-02-16 (×2): qty 1

## 2024-02-16 MED ORDER — ENSURE ENLIVE PO LIQD
237.0000 mL | Freq: Two times a day (BID) | ORAL | Status: DC
  Administered 2024-02-17 – 2024-02-18 (×3): 237 mL via ORAL

## 2024-02-16 MED ORDER — SENNA 8.6 MG PO TABS
2.0000 | ORAL_TABLET | Freq: Two times a day (BID) | ORAL | Status: DC
  Administered 2024-02-16 – 2024-02-17 (×3): 17.2 mg via ORAL
  Filled 2024-02-16 (×3): qty 2

## 2024-02-16 MED ORDER — RIVAROXABAN 15 MG PO TABS
15.0000 mg | ORAL_TABLET | Freq: Every day | ORAL | Status: DC
  Administered 2024-02-17: 15 mg via ORAL
  Filled 2024-02-16 (×2): qty 1

## 2024-02-16 MED ORDER — CHLORHEXIDINE GLUCONATE CLOTH 2 % EX PADS
6.0000 | MEDICATED_PAD | Freq: Every day | CUTANEOUS | Status: DC
  Administered 2024-02-16 – 2024-02-18 (×3): 6 via TOPICAL

## 2024-02-16 MED ORDER — ORAL CARE MOUTH RINSE: 15.0000 mL | OROMUCOSAL | Status: DC | PRN

## 2024-02-16 NOTE — Plan of Care (Signed)
  Problem: Fluid Volume: Goal: Ability to maintain a balanced intake and output will improve Outcome: Progressing   Problem: Nutritional: Goal: Maintenance of adequate nutrition will improve Outcome: Progressing   Problem: Activity: Goal: Ability to tolerate increased activity will improve Outcome: Progressing   Problem: Safety: Goal: Ability to remain free from injury will improve Outcome: Progressing   Problem: Skin Integrity: Goal: Risk for impaired skin integrity will decrease Outcome: Progressing

## 2024-02-16 NOTE — Progress Notes (Signed)
 PHARMACY - ANTICOAGULATION CONSULT NOTE  Pharmacy Consult for enoxaparin  to rivaroxaban  Indication: atrial fibrillation  Allergies  Allergen Reactions   Versed  [Midazolam ] Anxiety    Frantic, out of my mind, agitated    Augmentin [Amoxicillin-Pot Clavulanate] Other (See Comments)    Elevates potassium level   Bactrim  [Sulfamethoxazole -Trimethoprim ] Other (See Comments)    ^ K+( elevated)    Demerol [Meperidine]     Delusional    Meperidine Hcl     Other reaction(s): delusional   Scopolamine     Delusional   Patch *   Tramadol  Other (See Comments)    Keeps awake    Patient Measurements: Height: 5\' 5"  (165.1 cm) Weight: 51.7 kg (114 lb) IBW/kg (Calculated) : 57 HEPARIN  DW (KG): 51.7  Vital Signs: Temp: 97.6 F (36.4 C) (05/25 0736) Temp Source: Oral (05/25 0736) BP: 142/70 (05/25 0736) Pulse Rate: 99 (05/25 0736)  Labs: Recent Labs    02/14/24 0901 02/15/24 1400 02/16/24 0947  HGB 11.8* 12.3 12.4  HCT 34.3* 36.8 37.6  PLT 119* 105* 121*  CREATININE 1.08* 0.91 0.91    Estimated Creatinine Clearance: 39.6 mL/min (by C-G formula based on SCr of 0.91 mg/dL).   Assessment: Patient with new onset Afib w/ RVR. On Xarelto  2.5mg  BID PTA for PAD- last dose 5/20 PM.   Patient on therapeutic enoxaparin  and tolerating well. Pharmacy now consulted for transition to rivaroxaban  for Afib. Last dose of enoxaparin  02/16/24 at 0844. Given CrCl of 39.6 mL/min will dose-reduce to rivaroxaban  15 mg daily. Hgb stable at 12.4, plts low stable. No signs/symptoms of bleeding per RN.  Goal of Therapy:  Monitor platelets by anticoagulation protocol: Yes   Plan:  Stop enoxaparin  Start rivaroxaban  15 mg daily with supper Monitor CBC and signs/symptoms of bleeding  Thank you for allowing pharmacy to be a part of this patient's care.  Valarie Garner, PharmD PGY1 Pharmacy Resident  Please check AMION for all [facility] phone numbers After 10:00 PM, call Main Pharmacy  [phone] 02/16/2024 11:37 AM

## 2024-02-16 NOTE — Progress Notes (Signed)
 [facility] PROGRESS NOTE   [name] WUJ:[id number] DOB: [birth year] DOA: 02/12/2024  PCP: Ransom Byers, MD  Brief History: 83 y.o. female with a past medical history of essential hypertension, diabetes mellitus type 2, coronary artery disease, peripheral artery disease status post multiple interventions previously, status post CABG previously who is under the care of cancer Center for history of gallbladder cancer.  She has completed radiation treatment.  She has also continue treatment with the capecitabine .  She continues to get treatment with durvalumab .  She completed radiation treatment in April.  Her last infusion with the durvalumab  was on April 23.  Patient missed her appointments at the cancer center.  When daughter went to check on her she was lying on the bed and was noted to be confused.  She was brought into the hospital.  She was noted to be febrile.  Noted to have pneumonia.  She was hospitalized for further management.     Consultants: None  Procedures: None    Subjective/Interval History: Patient mentions that she feels much better this morning.  Looking forward to ambulating with physical therapy.  Daughter is at the bedside.  Denies any shortness of breath.  Tolerating her diet.  Urinating well.  Passing gas from below.     Assessment/Plan:  Pneumonia most likely aspiration pneumonia with severe sepsis, present on admission Patient was febrile in the ED with elevated WBC.  She also had lactic acidosis with initial level of 7.8.  Improvement noted. COVID-19 influenza and RSV PCR negative.  CT suggested right lung pneumonia.  Patient likely aspirated as she mentioned episodes of nausea and vomiting few days prior to admission. Patient was quite tachycardic at admission.  She met criteria for severe sepsis due to AKI. Patient has clinically improved.  WBC is better. Antibiotics were changed over to ceftriaxone  and doxycycline .  Blood cultures negative  so far.  Surprisingly urine culture grew 70,000 colonies of Klebsiella.  However she never had any symptoms suggesting UTI.  Ceftriaxone  should cover this organism. She is noted to have intolerance to Augmentin.  But hesitate using fluoroquinolones.  She is also intolerant to Bactrim .  Will leave her on ceftriaxone  for now. Seen by PT and OT.  Home health is recommended but she does live by herself so the family would like for her mobility to improve some more before she goes home.  Otherwise they are open to the idea of short-term rehab.  Await PT reevaluation.   Acute kidney injury with metabolic acidosis/hypokalemia This is in the setting of nausea vomiting.  Noted to be on diuretic and ARB at home.  Also was on metformin  at home.   Patient was hydrated.  Renal function is back to baseline now.  IV fluids discontinued.   Diabetes mellitus type 2 Noted to be on metformin  Jardiance  and Prandin  prior to admission.  No longer takes NovoLog .   HbA1c 4.9. Monitor CBGs.   Paroxysmal atrial fibrillation with RVR No previous history of atrial fibrillation.  Likely triggered by sepsis.   TSH noted to be 0.112.  Free T4 is 1.16.  No change in treatment plan.  Will recommend thyroid  function test be rechecked in a few weeks. Echocardiogram shows normal systolic function with EF of 55 to 60%. CHADS2 vascular score is 6.  Anticoagulation discussed.  Patient was on low-dose Xarelto  previously.  Agreeable to therapeutic dose Xarelto .  Will change to Xarelto  today if LFTs show continued improvement.  Aspirin  can be discontinued at  that time.  Continue therapeutic Lovenox  for now. Continue metoprolol .  Remains in sinus rhythm. Outpatient follow-up with cardiology.  She is followed by Dr. Katheryne Pane.  Transaminitis Worsening LFTs most likely due to severe sepsis.  Bilirubin and alkaline phosphatase levels were normal.  Transaminases showed improvement yesterday.  Await labs from today.   Abdomen remains  benign. Statin is currently on hold. Recently checked hepatitis panel in 2024 was unremarkable.  Thrombocytopenia Most likely secondary to sepsis.  Previous labs reviewed.  She has had mild thrombocytopenia in the past as well.  Anticipate that her platelet counts will improve.  Gallbladder cancer Followed by medical oncology.  Has completed radiation treatment.  Remains on immune treatment.  Holding these medications for now.   She does have a biliary stent.  Bilirubin is normal.  Elevated AST ALT probably due to sepsis.     Coronary artery disease status post CABG Cardiac status is stable.  Continue to monitor.   Peripheral artery disease She has had angioplasties to lower extremity blood vessels previously.  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  Currently not an active issue.    Constipation CT of the abdomen pelvis suggest significant constipation.  TSH noted to be low. Has had multiple bowel movement with bowel regimen.  Continue with laxatives.  DVT Prophylaxis: Full dose Lovenox  Code Status: Full code Family Communication: Discussed with patient and her daughter Disposition Plan: Home health is recommended     Medications: Scheduled:  aspirin   81 mg Oral QHS   brimonidine   1 drop Right Eye BID   enoxaparin  (LOVENOX ) injection  50 mg Subcutaneous BID   insulin  aspart  0-9 Units Subcutaneous TID WC   metoprolol  tartrate  25 mg Oral BID   polyethylene glycol  17 g Oral Daily   senna  2 tablet Oral BID   Continuous:  cefTRIAXone  (ROCEPHIN )  IV 2 g (02/15/24 1119)   PRN:bismuth subsalicylate, ondansetron  **OR** ondansetron  (ZOFRAN ) IV, traZODone   Antibiotics: Anti-infectives (From admission, onward)    Start     Dose/Rate Route Frequency Ordered Stop   02/14/24 1145  cefTRIAXone  (ROCEPHIN ) 2 g in sodium chloride  0.9 % 100 mL IVPB        2 g 200 mL/hr over 30 Minutes Intravenous Every 24 hours 02/14/24 1048 02/19/24 1144   02/14/24 1145  doxycycline   (VIBRA -TABS) tablet 100 mg        100 mg Oral Every 12 hours 02/14/24 1048 02/16/24 0844   02/13/24 1400  ceFEPIme (MAXIPIME) 2 g in sodium chloride  0.9 % 100 mL IVPB  Status:  Discontinued        2 g 200 mL/hr over 30 Minutes Intravenous Every 24 hours 02/12/24 1746 02/14/24 1048   02/13/24 1400  vancomycin  (VANCOREADY) IVPB 500 mg/100 mL  Status:  Discontinued        500 mg 100 mL/hr over 60 Minutes Intravenous Every 24 hours 02/13/24 1321 02/14/24 1048   02/12/24 1746  vancomycin  variable dose per unstable renal function (pharmacist dosing)  Status:  Discontinued         Does not apply See admin instructions 02/12/24 1746 02/13/24 1321   02/12/24 1730  vancomycin  (VANCOCIN ) IVPB 1000 mg/200 mL premix  Status:  Discontinued        1,000 mg 200 mL/hr over 60 Minutes Intravenous  Once 02/12/24 1722 02/12/24 1722   02/12/24 1730  ceFEPIme (MAXIPIME) 2 g in sodium chloride  0.9 % 100 mL IVPB  Status:  Discontinued  2 g 200 mL/hr over 30 Minutes Intravenous  Once 02/12/24 1722 02/12/24 1722   02/12/24 1345  ceFEPIme (MAXIPIME) 2 g in sodium chloride  0.9 % 100 mL IVPB        2 g 200 mL/hr over 30 Minutes Intravenous  Once 02/12/24 1332 02/12/24 1439   02/12/24 1345  metroNIDAZOLE  (FLAGYL ) IVPB 500 mg        500 mg 100 mL/hr over 60 Minutes Intravenous  Once 02/12/24 1332 02/12/24 1600   02/12/24 1345  vancomycin  (VANCOCIN ) IVPB 1000 mg/200 mL premix        1,000 mg 200 mL/hr over 60 Minutes Intravenous  Once 02/12/24 1332 02/12/24 1541       Objective:  Vital Signs  Vitals:   02/15/24 2026 02/16/24 0023 02/16/24 0552 02/16/24 0736  BP: (!) 125/56 (!) 154/61 (!) 162/65 (!) 142/70  Pulse: 93 93 97 99  Resp: 18 17 17 17   Temp: 97.7 F (36.5 C) 97.9 F (36.6 C) 97.7 F (36.5 C) 97.6 F (36.4 C)  TempSrc: Axillary Axillary Axillary Oral  SpO2: 97% 98% 98% 99%  Weight:      Height:        Intake/Output Summary (Last 24 hours) at 02/16/2024 0952 Last data filed at 02/16/2024  4132 Gross per 24 hour  Intake 240 ml  Output 550 ml  Net -310 ml   Filed Weights   02/12/24 1407 02/13/24 1115  Weight: 51.7 kg 51.7 kg    General appearance: Awake alert.  In no distress Resp: Clear to auscultation bilaterally.  Normal effort Cardio: S1-S2 is normal regular.  No S3-S4.  No rubs murmurs or bruit GI: Abdomen is soft.  Nontender nondistended.  Bowel sounds are present normal.  No masses organomegaly   Lab Results:  Data Reviewed: I have personally reviewed following labs and reports of the imaging studies  CBC: Recent Labs  Lab 02/12/24 1330 02/13/24 0608 02/14/24 0901 02/15/24 1400  WBC 14.0* 17.7* 13.4* 10.4  NEUTROABS 12.9*  --   --   --   HGB 12.0 11.3* 11.8* 12.3  HCT 35.8* 34.2* 34.3* 36.8  MCV 105.0* 104.6* 101.5* 102.8*  PLT 224 147* 119* 105*    Basic Metabolic Panel: Recent Labs  Lab 02/12/24 1330 02/12/24 1838 02/13/24 0608 02/14/24 0901 02/15/24 1400  NA 137 137 136 140 140  K 4.0 3.7 3.4* 3.9 3.9  CL 105 106 106 111 108  CO2 11* 17* 19* 22 22  GLUCOSE 132* 197* 138* 78 182*  BUN 81* 74* 65* 46* 35*  CREATININE 2.31* 1.93* 1.49* 1.08* 0.91  CALCIUM  10.2 9.7 9.6 9.2 9.4    GFR: Estimated Creatinine Clearance: 39.6 mL/min (by C-G formula based on SCr of 0.91 mg/dL).  Liver Function Tests: Recent Labs  Lab 02/12/24 1330 02/13/24 0608 02/14/24 0901 02/15/24 1400  AST 264* 639* 1,133* 755*  ALT 86* 281* 674* 740*  ALKPHOS 84 75 107 190*  BILITOT 0.9 1.0 0.7 0.8  PROT 5.5* 4.9* 4.6* 4.8*  ALBUMIN  2.4* 2.1* 1.8* 2.0*    Coagulation Profile: Recent Labs  Lab 02/12/24 1330 02/13/24 0608  INR 2.1* 2.1*    HbA1C: No results for input(s): "HGBA1C" in the last 72 hours.   CBG: Recent Labs  Lab 02/15/24 0754 02/15/24 1240 02/15/24 1744 02/15/24 2032 02/16/24 0737  GLUCAP 96 163* 148* 128* 150*    Thyroid  Function Tests: Recent Labs    02/14/24 0901  FREET4 1.16*   Recent Results (from the  past 240  hours)  Blood Culture (routine x 2)     Status: None (Preliminary result)   Collection Time: 02/12/24  1:25 PM   Specimen: BLOOD RIGHT FOREARM  Result Value Ref Range Status   Specimen Description BLOOD RIGHT FOREARM  Final   Special Requests   Final    BOTTLES DRAWN AEROBIC AND ANAEROBIC Blood Culture adequate volume   Culture   Final    NO GROWTH 4 DAYS Performed at [facility], [address], [place], [place] [address]    Report Status PENDING  Incomplete  Blood Culture (routine x 2)     Status: None (Preliminary result)   Collection Time: 02/12/24  1:32 PM   Specimen: BLOOD LEFT FOREARM  Result Value Ref Range Status   Specimen Description BLOOD LEFT FOREARM  Final   Special Requests   Final    BOTTLES DRAWN AEROBIC AND ANAEROBIC Blood Culture adequate volume   Culture   Final    NO GROWTH 4 DAYS Performed at [facility], [address], [place], [place] [address]    Report Status PENDING  Incomplete  Resp panel by RT-PCR (RSV, Flu A&B, Covid) Anterior Nasal Swab     Status: None   Collection Time: 02/12/24  1:39 PM   Specimen: Anterior Nasal Swab  Result Value Ref Range Status   SARS Coronavirus 2 by RT PCR NEGATIVE NEGATIVE Final   Influenza A by PCR NEGATIVE NEGATIVE Final   Influenza B by PCR NEGATIVE NEGATIVE Final    Comment: (NOTE) The Xpert Xpress SARS-CoV-2/FLU/RSV plus assay is intended as an aid in the diagnosis of influenza from Nasopharyngeal swab specimens and should not be used as a sole basis for treatment. Nasal washings and aspirates are unacceptable for Xpert Xpress SARS-CoV-2/FLU/RSV testing.  Fact Sheet for Patients: BloggerCourse.com  Fact Sheet for Healthcare Providers: SeriousBroker.it  This test is not yet approved or cleared by the [place]  FDA and has been authorized for detection and/or diagnosis of SARS-CoV-2 by FDA under an Emergency Use Authorization (EUA). This EUA  will remain in effect (meaning this test can be used) for the duration of the COVID-19 declaration under Section 564(b)(1) of the Act, 21 U.S.C. section 360bbb-3(b)(1), unless the authorization is terminated or revoked.     Resp Syncytial Virus by PCR NEGATIVE NEGATIVE Final    Comment: (NOTE) Fact Sheet for Patients: BloggerCourse.com  Fact Sheet for Healthcare Providers: SeriousBroker.it  This test is not yet approved or cleared by the [place]  FDA and has been authorized for detection and/or diagnosis of SARS-CoV-2 by FDA under an Emergency Use Authorization (EUA). This EUA will remain in effect (meaning this test can be used) for the duration of the COVID-19 declaration under Section 564(b)(1) of the Act, 21 U.S.C. section 360bbb-3(b)(1), unless the authorization is terminated or revoked.  Performed at [facility], [address] [place], [place] [address]   Urine Culture     Status: Abnormal   Collection Time: 02/12/24  1:50 PM   Specimen: Urine, Random  Result Value Ref Range Status   Specimen Description URINE, RANDOM  Final   Special Requests   Final    NONE Reflexed from [id number] Performed at [facility], [address], [place], [place] [address]    Culture 70,000 COLONIES/mL KLEBSIELLA PNEUMONIAE (A)  Final   Report Status 02/15/2024 FINAL  Final   Organism ID, Bacteria KLEBSIELLA PNEUMONIAE (A)  Final  Susceptibility   Klebsiella pneumoniae - MIC*    AMPICILLIN >=32 RESISTANT Resistant     CEFAZOLIN  <=4 SENSITIVE Sensitive     CEFEPIME <=0.12 SENSITIVE Sensitive     CEFTRIAXONE  <=0.25 SENSITIVE Sensitive     CIPROFLOXACIN  <=0.25 SENSITIVE Sensitive     GENTAMICIN <=1 SENSITIVE Sensitive     IMIPENEM <=0.25 SENSITIVE Sensitive     NITROFURANTOIN <=16 SENSITIVE Sensitive     TRIMETH /SULFA  <=20 SENSITIVE Sensitive     AMPICILLIN/SULBACTAM 4 SENSITIVE Sensitive     PIP/TAZO <=4 SENSITIVE  Sensitive ug/mL    * 70,000 COLONIES/mL KLEBSIELLA PNEUMONIAE      Radiology Studies: US  ABDOMEN LIMITED WITH LIVER DOPPLER Result Date: 02/14/2024 CLINICAL DATA:  Transaminitis EXAM: DUPLEX ULTRASOUND OF LIVER TECHNIQUE: Color and duplex Doppler ultrasound was performed to evaluate the hepatic in-flow and out-flow vessels. COMPARISON:  02/12/2024 FINDINGS: Liver: Heterogeneous increased liver echotexture. 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 Normal hepatic contour without nodularity. No intrahepatic biliary duct dilation. Main Portal Vein size: 1.0 cm Portal Vein Velocities Main Prox:  24.2 cm/sec Main Mid: 22.7 cm/sec Main Dist:  27.3 cm/sec Right: 16.2 cm/sec Left: 9.8 cm/sec Hepatic Vein Velocities Right:  28.6 cm/sec Middle:  47.2 cm/sec Left:  35.0 cm/sec IVC: Present and patent with normal respiratory phasicity. Hepatic Artery Velocity:  41.3 cm/sec Splenic Vein Velocity:  22.3 cm/sec Spleen: 10.3 cm x 4.6 cm x 3.7 cm with a total volume of 92.6 cm^3 (411 cm^3 is upper limit normal) Portal Vein Occlusion/Thrombus: No Splenic Vein Occlusion/Thrombus: No Ascites: Present, trace Varices: None IMPRESSION: 1. Heterogeneous increased liver echotexture. 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 2. Normal hepatic Doppler. 3. Trace ascites. Electronically Signed   By: Bobbye Burrow M.D.   On: 02/14/2024 16:02       LOS: 4 days   Kelsy Polack Lyndon Santiago  [facility] Pager on [email or web address]  02/16/2024, 9:52 AM

## 2024-02-16 NOTE — Discharge Instructions (Signed)
Information on my medicine - XARELTO® (Rivaroxaban) ° °This medication education was reviewed with me or my healthcare representative as part of my discharge preparation.   ° °Why was Xarelto® prescribed for you? °Xarelto® was prescribed for you to reduce the risk of a blood clot forming that can cause a stroke if you have a medical condition called atrial fibrillation (a type of irregular heartbeat). ° °What do you need to know about xarelto® ? °Take your Xarelto® ONCE DAILY at the same time every day with your evening meal. °If you have difficulty swallowing the tablet whole, you may crush it and mix in applesauce just prior to taking your dose. ° °Take Xarelto® exactly as prescribed by your doctor and DO NOT stop taking Xarelto® without talking to the doctor who prescribed the medication.  Stopping without other stroke prevention medication to take the place of Xarelto® may increase your risk of developing a clot that causes a stroke.  Refill your prescription before you run out. ° °After discharge, you should have regular check-up appointments with your healthcare provider that is prescribing your Xarelto®.  In the future your dose may need to be changed if your kidney function or weight changes by a significant amount. ° °What do you do if you miss a dose? °If you are taking Xarelto® ONCE DAILY and you miss a dose, take it as soon as you remember on the same day then continue your regularly scheduled once daily regimen the next day. Do not take two doses of Xarelto® at the same time or on the same day.  ° °Important Safety Information °A possible side effect of Xarelto® is bleeding. You should call your healthcare provider right away if you experience any of the following: °? Bleeding from an injury or your nose that does not stop. °? Unusual colored urine (red or dark brown) or unusual colored stools (red or black). °? Unusual bruising for unknown reasons. °? A serious fall or if you hit your head (even if  there is no bleeding). ° °Some medicines may interact with Xarelto® and might increase your risk of bleeding while on Xarelto®. To help avoid this, consult your healthcare provider or pharmacist prior to using any new prescription or non-prescription medications, including herbals, vitamins, non-steroidal anti-inflammatory drugs (NSAIDs) and supplements. ° °This website has more information on Xarelto®: www.xarelto.com. ° ° °

## 2024-02-16 NOTE — Plan of Care (Signed)
  Problem: Education: Goal: Ability to describe self-care measures that may prevent or decrease complications (Diabetes Survival Skills Education) will improve Outcome: Progressing   Problem: Coping: Goal: Ability to adjust to condition or change in health will improve Outcome: Progressing   Problem: Fluid Volume: Goal: Ability to maintain a balanced intake and output will improve Outcome: Progressing   Problem: Health Behavior/Discharge Planning: Goal: Ability to identify and utilize available resources and services will improve Outcome: Progressing Goal: Ability to manage health-related needs will improve Outcome: Progressing   Problem: Metabolic: Goal: Ability to maintain appropriate glucose levels will improve Outcome: Progressing

## 2024-02-17 DIAGNOSIS — R7401 Elevation of levels of liver transaminase levels: Secondary | ICD-10-CM | POA: Diagnosis not present

## 2024-02-17 DIAGNOSIS — J189 Pneumonia, unspecified organism: Secondary | ICD-10-CM | POA: Diagnosis not present

## 2024-02-17 LAB — CULTURE, BLOOD (ROUTINE X 2)
Culture: NO GROWTH
Culture: NO GROWTH
Special Requests: ADEQUATE
Special Requests: ADEQUATE

## 2024-02-17 LAB — COMPREHENSIVE METABOLIC PANEL WITH GFR
ALT: 374 U/L — ABNORMAL HIGH (ref 0–44)
AST: 170 U/L — ABNORMAL HIGH (ref 15–41)
Albumin: 1.9 g/dL — ABNORMAL LOW (ref 3.5–5.0)
Alkaline Phosphatase: 178 U/L — ABNORMAL HIGH (ref 38–126)
Anion gap: 5 (ref 5–15)
BUN: 27 mg/dL — ABNORMAL HIGH (ref 8–23)
CO2: 23 mmol/L (ref 22–32)
Calcium: 9.2 mg/dL (ref 8.9–10.3)
Chloride: 111 mmol/L (ref 98–111)
Creatinine, Ser: 0.9 mg/dL (ref 0.44–1.00)
GFR, Estimated: >60 mL/min (ref >60)
Glucose, Bld: 172 mg/dL — ABNORMAL HIGH (ref 70–99)
Potassium: 4 mmol/L (ref 3.5–5.1)
Sodium: 139 mmol/L (ref 135–145)
Total Bilirubin: 0.6 mg/dL (ref 0.0–1.2)
Total Protein: 4.4 g/dL — ABNORMAL LOW (ref 6.5–8.1)

## 2024-02-17 LAB — GLUCOSE, CAPILLARY
Glucose-Capillary: 160 mg/dL — ABNORMAL HIGH (ref 70–99)
Glucose-Capillary: 230 mg/dL — ABNORMAL HIGH (ref 70–99)
Glucose-Capillary: 218 mg/dL — ABNORMAL HIGH (ref 70–99)
Glucose-Capillary: 222 mg/dL — ABNORMAL HIGH (ref 70–99)

## 2024-02-17 LAB — CBC
HCT: 35.9 % — ABNORMAL LOW (ref 36.0–46.0)
Hemoglobin: 11.6 g/dL — ABNORMAL LOW (ref 12.0–15.0)
MCH: 33.3 pg (ref 26.0–34.0)
MCHC: 32.3 g/dL (ref 30.0–36.0)
MCV: 103.2 fL — ABNORMAL HIGH (ref 80.0–100.0)
Platelets: 100 10*3/uL — ABNORMAL LOW (ref 150–400)
RBC: 3.48 MIL/uL — ABNORMAL LOW (ref 3.87–5.11)
RDW: 18.2 % — ABNORMAL HIGH (ref 11.5–15.5)
WBC: 8.1 10*3/uL (ref 4.0–10.5)
nRBC: 0 % (ref 0.0–0.2)

## 2024-02-17 MED ORDER — ACETAMINOPHEN 160 MG/5ML PO SOLN
325.0000 mg | Freq: Once | ORAL | Status: AC
  Administered 2024-02-17: 325 mg via ORAL
  Filled 2024-02-17: qty 20.3

## 2024-02-17 NOTE — Progress Notes (Signed)
 [facility] PROGRESS NOTE   [name]:[id number] DOB: 29-Apr-1941 DOA: 02/12/2024  PCP: Ransom Byers, MD  Brief History: 83 y.o. female with a past medical history of essential hypertension, diabetes mellitus type 2, coronary artery disease, peripheral artery disease status post multiple interventions previously, status post CABG previously who is under the care of cancer Center for history of gallbladder cancer.  She has completed radiation treatment.  She has also continue treatment with the capecitabine .  She continues to get treatment with durvalumab .  She completed radiation treatment in April.  Her last infusion with the durvalumab  was on April 23.  Patient missed her appointments at the cancer center.  When daughter went to check on her she was lying on the bed and was noted to be confused.  She was brought into the hospital.  She was noted to be febrile.  Noted to have pneumonia.  She was hospitalized for further management.     Consultants: None  Procedures: None    Subjective/Interval History: Patient's daughter noted smears of blood after patient had a bowel movement yesterday.  No bleeding otherwise.  Patient denies any abdominal pain nausea or vomiting.  She is at blood on the toilet paper previously and she suspects is due to hemorrhoids.     Assessment/Plan:  Pneumonia most likely aspiration pneumonia with severe sepsis, present on admission Patient was febrile in the ED with elevated WBC.  She also had lactic acidosis with initial level of 7.8.  Improvement noted. COVID-19 influenza and RSV PCR negative.  CT suggested right lung pneumonia.  Patient likely aspirated as she mentioned episodes of nausea and vomiting few days prior to admission. Patient was quite tachycardic at admission.  She met criteria for severe sepsis due to AKI. Patient has clinically improved.  WBC is better. Antibiotics were changed over to ceftriaxone  and doxycycline .  Blood  cultures negative so far.  Surprisingly urine culture grew 70,000 colonies of Klebsiella.  However she never had any symptoms suggesting UTI.  Ceftriaxone  should cover this organism. She is noted to have intolerance to Augmentin.  But hesitate using fluoroquinolones.  She is also intolerant to Bactrim .  Will leave her on ceftriaxone  for now.  She will complete 5-day course tomorrow. Seen by PT and OT.  Home health is recommended but she does live by herself so the family would like for her mobility to improve some more before she goes home.  Otherwise they are open to the idea of short-term rehab.  Await PT reevaluation.   Acute kidney injury with metabolic acidosis/hypokalemia This is in the setting of nausea vomiting.  Noted to be on diuretic and ARB at home.  Also was on metformin  at home.   Patient was hydrated.  Renal function is back to baseline now.  IV fluids discontinued.  Paroxysmal atrial fibrillation with RVR No previous history of atrial fibrillation.  Likely triggered by sepsis.   TSH noted to be 0.112.  Free T4 is 1.16.  No change in treatment plan.  Will recommend thyroid  function test be rechecked in a few weeks. Echocardiogram shows normal systolic function with EF of 55 to 60%. CHADS2 vascular score is 6.  Anticoagulation discussed.  Patient was on low-dose Xarelto  previously.  Agreeable to therapeutic dose Xarelto .  This was initiated yesterday however she did not get due to concern for rectal bleeding.  See below Aspirin  can be discontinued. Continue metoprolol .  Remains in sinus rhythm. Outpatient follow-up with cardiology.  She is  followed by Dr. Katheryne Pane.  Hematochezia Some blood was noted on the toilet paper after she had a bowel movement yesterday.  Rectal exam does not show any overt bleeding.  External hemorrhoids noted.  This is in the setting of constipation and straining.  Hemoglobin is stable for the most part this morning.  Okay to continue with  rivaroxaban .  Transaminitis Worsening LFTs most likely due to severe sepsis.  Bilirubin and alkaline phosphatase levels were normal.   LFTs continue to improve and anticipate they will return to baseline in a few days.   Statin is currently on hold. Recently checked hepatitis panel in 2024 was unremarkable.   Diabetes mellitus type 2 Noted to be on metformin  Jardiance  and Prandin  prior to admission.  No longer takes NovoLog .   HbA1c 4.9. Monitor CBGs.  Thrombocytopenia Most likely secondary to sepsis.  Previous labs reviewed.  She has had mild thrombocytopenia in the past as well.  Anticipate that her platelet counts will improve.  Counts are stable.  Gallbladder cancer Followed by medical oncology.  Has completed radiation treatment.  Remains on immune treatment.  Holding these medications for now.   She does have a biliary stent.  Bilirubin is normal.  Elevated AST ALT probably due to sepsis.     Coronary artery disease status post CABG Cardiac status is stable.  Continue to monitor.   Peripheral artery disease She has had angioplasties to lower extremity blood vessels previously.  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  Currently not an active issue.    Constipation CT of the abdomen pelvis suggest significant constipation.  TSH noted to be low. Has had multiple bowel movement with bowel regimen.  Continue with laxatives.  DVT Prophylaxis: Xarelto  Code Status: Full code Family Communication: Discussed with patient and her daughter Disposition Plan: Home health was recommended but patient lives by herself and daughter is concerned about her ability to take care of herself at home.  She was independent prior to this hospitalization.  Waiting for PT reevaluation.  TOC is following for possible short-term rehab at skilled nursing facility.     Medications: Scheduled:  aspirin   81 mg Oral QHS   brimonidine   1 drop Right Eye BID   Chlorhexidine  Gluconate Cloth  6 each  Topical Daily   doxycycline   100 mg Oral Q12H   feeding supplement  237 mL Oral BID BM   insulin  aspart  0-9 Units Subcutaneous TID WC   metoprolol  tartrate  25 mg Oral BID   polyethylene glycol  17 g Oral Daily   Rivaroxaban   15 mg Oral Q supper   senna  2 tablet Oral BID   sodium chloride  flush  10-40 mL Intracatheter Q12H   Continuous:  cefTRIAXone  (ROCEPHIN )  IV 2 g (02/16/24 1240)   PRN:bismuth subsalicylate, ondansetron  **OR** ondansetron  (ZOFRAN ) IV, mouth rinse, sodium chloride  flush, traZODone   Antibiotics: Anti-infectives (From admission, onward)    Start     Dose/Rate Route Frequency Ordered Stop   02/16/24 2000  doxycycline  (VIBRA -TABS) tablet 100 mg        100 mg Oral Every 12 hours 02/16/24 0958 02/19/24 0959   02/14/24 1145  cefTRIAXone  (ROCEPHIN ) 2 g in sodium chloride  0.9 % 100 mL IVPB        2 g 200 mL/hr over 30 Minutes Intravenous Every 24 hours 02/14/24 1048 02/19/24 1144   02/14/24 1145  doxycycline  (VIBRA -TABS) tablet 100 mg        100 mg Oral Every 12  hours 02/14/24 1048 02/16/24 0844   02/13/24 1400  ceFEPIme (MAXIPIME) 2 g in sodium chloride  0.9 % 100 mL IVPB  Status:  Discontinued        2 g 200 mL/hr over 30 Minutes Intravenous Every 24 hours 02/12/24 1746 02/14/24 1048   02/13/24 1400  vancomycin  (VANCOREADY) IVPB 500 mg/100 mL  Status:  Discontinued        500 mg 100 mL/hr over 60 Minutes Intravenous Every 24 hours 02/13/24 1321 02/14/24 1048   02/12/24 1746  vancomycin  variable dose per unstable renal function (pharmacist dosing)  Status:  Discontinued         Does not apply See admin instructions 02/12/24 1746 02/13/24 1321   02/12/24 1730  vancomycin  (VANCOCIN ) IVPB 1000 mg/200 mL premix  Status:  Discontinued        1,000 mg 200 mL/hr over 60 Minutes Intravenous  Once 02/12/24 1722 02/12/24 1722   02/12/24 1730  ceFEPIme (MAXIPIME) 2 g in sodium chloride  0.9 % 100 mL IVPB  Status:  Discontinued        2 g 200 mL/hr over 30 Minutes Intravenous   Once 02/12/24 1722 02/12/24 1722   02/12/24 1345  ceFEPIme (MAXIPIME) 2 g in sodium chloride  0.9 % 100 mL IVPB        2 g 200 mL/hr over 30 Minutes Intravenous  Once 02/12/24 1332 02/12/24 1439   02/12/24 1345  metroNIDAZOLE  (FLAGYL ) IVPB 500 mg        500 mg 100 mL/hr over 60 Minutes Intravenous  Once 02/12/24 1332 02/12/24 1600   02/12/24 1345  vancomycin  (VANCOCIN ) IVPB 1000 mg/200 mL premix        1,000 mg 200 mL/hr over 60 Minutes Intravenous  Once 02/12/24 1332 02/12/24 1541       Objective:  Vital Signs  Vitals:   02/16/24 1538 02/16/24 2010 02/17/24 0419 02/17/24 0807  BP: (!) 114/49 (!) 122/51 (!) 123/59 (!) 148/64  Pulse: 78 92 85 94  Resp: 14 17 17 17   Temp: (!) 97.5 F (36.4 C) 97.8 F (36.6 C) 97.9 F (36.6 C)   TempSrc: Oral Oral Oral   SpO2: 99% 98% 98% 98%  Weight:      Height:        Intake/Output Summary (Last 24 hours) at 02/17/2024 0919 Last data filed at 02/17/2024 0300 Gross per 24 hour  Intake 120 ml  Output 300 ml  Net -180 ml   Filed Weights   02/12/24 1407 02/13/24 1115  Weight: 51.7 kg 51.7 kg    General appearance: Awake alert.  In no distress Resp: Clear to auscultation bilaterally.  Normal effort Cardio: S1-S2 is normal regular.  No S3-S4.  No rubs murmurs or bruit GI: Abdomen is soft.  Nontender nondistended.  Bowel sounds are present normal.  No masses organomegaly No overt bleeding noted on rectal exam.  Hemorrhoids noted.   Lab Results:  Data Reviewed: I have personally reviewed following labs and reports of the imaging studies  CBC: Recent Labs  Lab 02/12/24 1330 02/13/24 0608 02/14/24 0901 02/15/24 1400 02/16/24 0947 02/17/24 0500  WBC 14.0* 17.7* 13.4* 10.4 9.5 8.1  NEUTROABS 12.9*  --   --   --   --   --   HGB 12.0 11.3* 11.8* 12.3 12.4 11.6*  HCT 35.8* 34.2* 34.3* 36.8 37.6 35.9*  MCV 105.0* 104.6* 101.5* 102.8* 102.7* 103.2*  PLT 224 147* 119* 105* 121* 100*    Basic Metabolic Panel: Recent Labs  Lab  02/13/24 0608 02/14/24 0901 02/15/24 1400 02/16/24 0947 02/17/24 0500  NA 136 140 140 141 139  K 3.4* 3.9 3.9 3.8 4.0  CL 106 111 108 111 111  CO2 19* 22 22 22 23   GLUCOSE 138* 78 182* 226* 172*  BUN 65* 46* 35* 28* 27*  CREATININE 1.49* 1.08* 0.91 0.91 0.90  CALCIUM  9.6 9.2 9.4 9.5 9.2    GFR: Estimated Creatinine Clearance: 40 mL/min (by C-G formula based on SCr of 0.9 mg/dL).  Liver Function Tests: Recent Labs  Lab 02/13/24 0608 02/14/24 0901 02/15/24 1400 02/16/24 0947 02/17/24 0500  AST 639* 1,133* 755* 375* 170*  ALT 281* 674* 740* 562* 374*  ALKPHOS 75 107 190* 174* 178*  BILITOT 1.0 0.7 0.8 0.8 0.6  PROT 4.9* 4.6* 4.8* 4.8* 4.4*  ALBUMIN  2.1* 1.8* 2.0* 2.0* 1.9*    Coagulation Profile: Recent Labs  Lab 02/12/24 1330 02/13/24 0608  INR 2.1* 2.1*    CBG: Recent Labs  Lab 02/16/24 0737 02/16/24 1219 02/16/24 1805 02/16/24 2006 02/17/24 0803  GLUCAP 150* 209* 155* 163* 160*    Recent Results (from the past 240 hours)  Blood Culture (routine x 2)     Status: None   Collection Time: 02/12/24  1:25 PM   Specimen: BLOOD RIGHT FOREARM  Result Value Ref Range Status   Specimen Description BLOOD RIGHT FOREARM  Final   Special Requests   Final    BOTTLES DRAWN AEROBIC AND ANAEROBIC Blood Culture adequate volume   Culture   Final    NO GROWTH 5 DAYS Performed at [facility], [address] [place], [place] [address]    Report Status 02/17/2024 FINAL  Final  Blood Culture (routine x 2)     Status: None   Collection Time: 02/12/24  1:32 PM   Specimen: BLOOD LEFT FOREARM  Result Value Ref Range Status   Specimen Description BLOOD LEFT FOREARM  Final   Special Requests   Final    BOTTLES DRAWN AEROBIC AND ANAEROBIC Blood Culture adequate volume   Culture   Final    NO GROWTH 5 DAYS Performed at [facility], [address] [place], [place] [address]    Report Status 02/17/2024 FINAL  Final  Resp panel by RT-PCR (RSV, Flu A&B, Covid)  Anterior Nasal Swab     Status: None   Collection Time: 02/12/24  1:39 PM   Specimen: Anterior Nasal Swab  Result Value Ref Range Status   SARS Coronavirus 2 by RT PCR NEGATIVE NEGATIVE Final   Influenza A by PCR NEGATIVE NEGATIVE Final   Influenza B by PCR NEGATIVE NEGATIVE Final    Comment: (NOTE) The Xpert Xpress SARS-CoV-2/FLU/RSV plus assay is intended as an aid in the diagnosis of influenza from Nasopharyngeal swab specimens and should not be used as a sole basis for treatment. Nasal washings and aspirates are unacceptable for Xpert Xpress SARS-CoV-2/FLU/RSV testing.  Fact Sheet for Patients: BloggerCourse.com  Fact Sheet for Healthcare Providers: SeriousBroker.it  This test is not yet approved or cleared by the [place]  FDA and has been authorized for detection and/or diagnosis of SARS-CoV-2 by FDA under an Emergency Use Authorization (EUA). This EUA will remain in effect (meaning this test can be used) for the duration of the COVID-19 declaration under Section 564(b)(1) of the Act, 21 U.S.C. section 360bbb-3(b)(1), unless the authorization is terminated or revoked.     Resp Syncytial Virus by PCR NEGATIVE NEGATIVE Final    Comment: (  NOTE) Fact Sheet for Patients: BloggerCourse.com  Fact Sheet for Healthcare Providers: SeriousBroker.it  This test is not yet approved or cleared by the [place]  FDA and has been authorized for detection and/or diagnosis of SARS-CoV-2 by FDA under an Emergency Use Authorization (EUA). This EUA will remain in effect (meaning this test can be used) for the duration of the COVID-19 declaration under Section 564(b)(1) of the Act, 21 U.S.C. section 360bbb-3(b)(1), unless the authorization is terminated or revoked.  Performed at [facility], [address] [place], [place] [address]   Urine Culture     Status: Abnormal    Collection Time: 02/12/24  1:50 PM   Specimen: Urine, Random  Result Value Ref Range Status   Specimen Description URINE, RANDOM  Final   Special Requests   Final    NONE Reflexed from [id number] Performed at [facility], [address] [place], [place] [address]    Culture 70,000 COLONIES/mL KLEBSIELLA PNEUMONIAE (A)  Final   Report Status 02/15/2024 FINAL  Final   Organism ID, Bacteria KLEBSIELLA PNEUMONIAE (A)  Final      Susceptibility   Klebsiella pneumoniae - MIC*    AMPICILLIN >=32 RESISTANT Resistant     CEFAZOLIN  <=4 SENSITIVE Sensitive     CEFEPIME <=0.12 SENSITIVE Sensitive     CEFTRIAXONE  <=0.25 SENSITIVE Sensitive     CIPROFLOXACIN  <=0.25 SENSITIVE Sensitive     GENTAMICIN <=1 SENSITIVE Sensitive     IMIPENEM <=0.25 SENSITIVE Sensitive     NITROFURANTOIN <=16 SENSITIVE Sensitive     TRIMETH /SULFA  <=20 SENSITIVE Sensitive     AMPICILLIN/SULBACTAM 4 SENSITIVE Sensitive     PIP/TAZO <=4 SENSITIVE Sensitive ug/mL    * 70,000 COLONIES/mL KLEBSIELLA PNEUMONIAE      Radiology Studies: No results found.      LOS: 5 days   Kelijah Towry [employer] on [email or web address]  02/17/2024, 9:19 AM

## 2024-02-17 NOTE — Consult Note (Signed)
 WOC Nurse Consult Note: Reason for Consult: Requested to assess a stage 2 PI on sacrum. Wound type: DTPI on sacrum and buttock. Notice a stage 1 on thigh. According to the caregiver, it was a incident on admission, transporting to the hospital. Pressure Injury POA: Yes Measurement: Sacrum/buttock - 15 x 8 cm x 0.1 cm. Two blisters ruptured 1 -  8 cm x 5 cm x 0.1 cm and 2 close to the coccyx - 2 cm x 1 cm. Wound bed: blister 100% red. Skin surrounding purple/red non bleachable. Drainage (amount, consistency, odor) Scant amount. No odor, serous. Periwound: redness, intact. Dressing procedure/placement/frequency: Apply Xeroform on the wound bed (change daily), cover with foam dressing, if the foam is in a good condition, can be change every 3 days or PRN.  WOC team will not plan to follow further.  Please reconsult if further assistance is needed. Thank-you,  Rachel Budds BSN, RN, [employer], WOC  (Pager: [phone])

## 2024-02-17 NOTE — Progress Notes (Signed)
 Physical Therapy Treatment Patient Details Name: [name] MRN: [id number] DOB: Aug 29, 1941 Today's Date: 02/17/2024   History of Present Illness Patient is a 83 year old female with AMS, severe sepsis, pneumonia. Positive for orthostatic hypotension during PT session 5/26. PMH: gallbladder cancer with radiation treatment, HTN, DM2, CAD, PAD, s/p CABG, bilateral TMA's (2022 and 2023).    PT Comments  Pt received in supine, agreeable to therapy session and with good participation and fair tolerance for transfer training. Pt limited due to symptomatic orthostatic hypotension and not able to take more than 1-2 sidesteps toward [facility] prior to needing seated break. MD/RN notified of pt symptoms and recommend trial of BLE compression socks to see if this improves her hemodynamic stability for more standing/gait training next session. Currently, pt is needing significant assist and at baseline pt is very active, playing billiards multiple times a week and is alone throughout the day while her daughter works. Recommendation updated to short term lower to moderate intensity post-acute rehab <3 hours per day, after discussion with pt and daughters and supervising PT Ashly C, family in agreement.   Vital Signs  Patient Position (if appropriate) Orthostatic Vitals  Orthostatic Lying   BP- Lying 121/60 (77)  Pulse- Lying 87  Orthostatic Sitting  BP- Sitting 107/54 (70)  Pulse- Sitting 88  Orthostatic Standing at 0 minutes  BP- Standing at 0 minutes 90/46 (61)  Pulse- Standing at 0 minutes 92     If plan is discharge home, recommend the following: A little help with walking and/or transfers;A little help with bathing/dressing/bathroom;Assist for transportation;Help with stairs or ramp for entrance;Assistance with cooking/housework   Can travel by private vehicle     Yes  Equipment Recommendations  Wheelchair (measurements PT);Other (comment) (pending progress; currently due to orthostatic hypotension)     Recommendations for Other Services       Precautions / Restrictions Precautions Precautions: Fall;Other (comment) Recall of Precautions/Restrictions: Intact Precaution/Restrictions Comments: Orthostatic hypotension, watch BP     Mobility  Bed Mobility Overal bed mobility: Needs Assistance Bed Mobility: Rolling, Sidelying to Sit, Sit to Sidelying Rolling: Min assist Sidelying to sit: Mod assist     Sit to sidelying: Min assist General bed mobility comments: hob flat, no rails exiting on R side for simulating home env. Increased time and cues to perform via RUE elbow propping via log roll, ultimately needing HHA to raise trunk fully without rail support. Increased time/cues for technique with contralateral hip scooting forward prior to STS trial.    Transfers Overall transfer level: Needs assistance Equipment used: Rolling walker (2 wheels) Transfers: Sit to/from Stand, Bed to chair/wheelchair/BSC Sit to Stand: Min assist   Step pivot transfers: Mod assist (simulated with lateral steps to [facility] x2)       General transfer comment: STS from EOB x2, on first trial pt states "I have to sit down" but able to perform x1 lateral step toward [facility] to simulate step pivot transfer. BP checked then sitting/standing and pt found to have symptomatic drop in BP (not able to verbalize symptoms but having to sit down suddenly) and not able to progress gait.    Ambulation/Gait                   Stairs             Wheelchair Mobility     Tilt Bed    Modified Rankin (Stroke Patients Only)       Balance Overall balance assessment: Needs assistance  Sitting-balance support: Feet supported, Single extremity supported Sitting balance-Leahy Scale: Fair Sitting balance - Comments: able to sit unsupported EOB but prefers single UE due to fear of falls   Standing balance support: Bilateral upper extremity supported, Reliant on assistive device for balance Standing  balance-Leahy Scale: Poor Standing balance comment: needs 1-2 UE support of RW and external support due to fear of falls in standing.                            Communication Communication Communication: No apparent difficulties  Cognition Arousal: Alert Behavior During Therapy: WFL for tasks assessed/performed, Anxious   PT - Cognitive impairments: Problem solving, Sequencing                       PT - Cognition Comments: Pt anxious and verbalizes fear of falls when standing, needs increased time/assist from baseline to mobilize. Pt denies orthostatic symptoms but when standing says "I have to sit down" but is not able to verbalize why she has to sit other than fear of falls. Pt somewhat less anxious when standing with +2 for safety (during BP assessment). Following commands: Intact Following commands impaired: Only follows one step commands consistently, Follows multi-step commands with increased time    Cueing Cueing Techniques: Verbal cues, Gestural cues  Exercises Other Exercises Other Exercises: seated BLE AROM: hip flexion, LAQ x10 reps ea Other Exercises: static standing x1-2 mins x2 trials for BLE strengthening    General Comments General comments (skin integrity, edema, etc.): see BP in comments above; HR WFL      Pertinent Vitals/Pain      Home Living                          Prior Function            PT Goals (current goals can now be found in the care plan section) Acute Rehab PT Goals Patient Stated Goal: To get back to walking so I can play pool again (pt has recently won awards for [employer]). PT Goal Formulation: With patient Time For Goal Achievement: 02/27/24 Progress towards PT goals: Progressing toward goals    Frequency    Min 2X/week      PT Plan      Co-evaluation              AM-PAC PT "6 Clicks" Mobility   Outcome Measure  Help needed turning from your back to your side while in a flat bed without  using bedrails?: A Little Help needed moving from lying on your back to sitting on the side of a flat bed without using bedrails?: A Lot Help needed moving to and from a bed to a chair (including a wheelchair)?: A Lot Help needed standing up from a chair using your arms (e.g., wheelchair or bedside chair)?: A Little Help needed to walk in hospital room?: Total Help needed climbing 3-5 steps with a railing? : Total 6 Click Score: 12    End of Session Equipment Utilized During Treatment: Gait belt Activity Tolerance: Patient limited by fatigue;Treatment limited secondary to medical complications (Comment);Other (comment) (symptomatic orthostatic hypotension) Patient left: in bed;with call bell/phone within reach;with bed alarm set;with SCD's reapplied;with family/visitor present (bed in chair posture) Nurse Communication: Mobility status;Other (comment) (orthostatic hypotension, MD also notified) PT Visit Diagnosis: Muscle weakness (generalized) (M62.81);Difficulty in walking, not elsewhere classified (R26.2)  Time: 1204-1237 PT Time Calculation (min) (ACUTE ONLY): 33 min  Charges:    $Therapeutic Exercise: 8-22 mins $Therapeutic Activity: 8-22 mins PT General Charges $$ ACUTE PT VISIT: 1 Visit                     Aiysha Jillson P., PTA Acute Rehabilitation Services Secure Chat Preferred 9a-5:30pm Office: [phone]    Mariel Shope [facility] 02/17/2024, 12:58 PM

## 2024-02-17 NOTE — TOC CM/SW Note (Addendum)
 Received a consult that family wants to look rehab. NCM secure chatted PTA, OT and SW.   Updated Randel Buss with [facility] regarding SNF

## 2024-02-17 NOTE — Plan of Care (Signed)
  Problem: Skin Integrity: Goal: Risk for impaired skin integrity will decrease Outcome: Progressing   Problem: Activity: Goal: Ability to tolerate increased activity will improve Outcome: Progressing   Problem: Coping: Goal: Level of anxiety will decrease Outcome: Progressing   Problem: Elimination: Goal: Will not experience complications related to bowel motility Outcome: Progressing   Problem: Nutrition: Goal: Adequate nutrition will be maintained Outcome: Progressing

## 2024-02-17 NOTE — Progress Notes (Signed)
 Occupational Therapy Treatment Patient Details Name: [name] MRN: [id number] DOB: 1941/03/24 Today's Date: 02/17/2024   History of present illness Patient is a 83 year old female with AMS, severe sepsis, pneumonia. PMH: gallbladder cancer with radiation treatment, HTN, DM2, CAD, PAD, s/p CABG, bilateral TMA's.   OT comments  Pt. Seen for skilled OT treatment session with both dtrs present also.  Pt. Required MOD A for portions of bed mobility.  Step pivot transfers to/from bsc with MOD A.  Heavy cues for safety and sequencing secondary to pt. Rushing transfer and attempting to sit prior to reaching desired surface.  Partial Post. LOB X1 when transitioning into standing for pericare after bsc use.  Therapist asst. Support required to offset the LOB and regain standing.  Pt. With notable fatigue and poor endurance.  Required seated rest break after rolling prior to sitting upright.  Dtrs. Present and report pt. Would be home alone 730am-500pm each day.  At this point pt. Requires MOD A for bed mobility and step pivot transfers to/from bsc and demonstrates high fall risk.  Changing d/c recommendation from [facility] to <3hrs/day for continued therapies.  Pt. And dtrs verbalized understanding and agreed.  Will alert OTR/L to co-sign.        If plan is discharge home, recommend the following:  A lot of help with walking and/or transfers;A lot of help with bathing/dressing/bathroom;Assistance with feeding   Equipment Recommendations  Tub/shower bench    Recommendations for Other Services      Precautions / Restrictions Precautions Precautions: Fall Recall of Precautions/Restrictions: Intact       Mobility Bed Mobility Overal bed mobility: Needs Assistance Bed Mobility: Rolling, Sidelying to Sit, Sit to Sidelying Rolling: Mod assist Sidelying to sit: Min assist     Sit to sidelying: Contact guard assist General bed mobility comments: hob flat, no rails exiting on R side for simulating home  env. pt. required mod A to initiate and complete rolling without use of a bed rail.  min a to guide trunk upright.  able to scoot eob without assistance.  back to bed able to bring bles into bed without physical assistance.  use of pads and dtr assisted pulling pt. up in bed    Transfers Overall transfer level: Needs assistance Equipment used: Rolling walker (2 wheels) Transfers: Sit to/from Stand, Bed to chair/wheelchair/BSC Sit to Stand: Mod assist Stand pivot transfers: Mod assist   Step pivot transfers: Mod assist, Max assist     General transfer comment: step pivot transfer eob approx. 4 steps to bsc with heavy mod a for rw management and sequencing.  pt. with notable fatigue quickly and would begin to rush the pivot and try to sit before she had reached the surface.  max cues for letting BLEs touch surface and reach back for arm rests prior to sitting down.  return back to eob, lob during peri care with physical assitance to correct.  pt. also trying to sit before fully at the eob.     Balance                                           ADL either performed or assessed with clinical judgement   ADL Overall ADL's : Needs assistance/impaired  Toilet Transfer: Moderate assistance;Cueing for sequencing;Cueing for safety;Stand-pivot;Rolling walker (2 wheels);BSC/3in1 Toilet Transfer Details (indicate cue type and reason): cues for sequencing of pivot and safety wtih RW.  pt. to and from 3n1 attempting to sit prior to pivot being complete Toileting- Clothing Manipulation and Hygiene: Maximal assistance;Sit to/from stand;Cueing for safety Toileting - Clothing Manipulation Details (indicate cue type and reason): partial post. LOB when standing up from 3n1 for peri care.  therapist asst. holding onto pt. already with use of gait belt and was able to keep her supported and standing upright     Functional mobility during ADLs: Moderate  assistance;Rolling walker (2 wheels)      Extremity/Trunk Assessment              Vision       Perception     Praxis     Communication Communication Communication: No apparent difficulties   Cognition Arousal: Alert Behavior During Therapy: WFL for tasks assessed/performed Cognition: No apparent impairments                                 Following commands impaired: Only follows one step commands consistently, Follows multi-step commands with increased time      Cueing   Cueing Techniques: Verbal cues  Exercises      Shoulder Instructions       General Comments      Pertinent Vitals/ Pain       Pain Assessment Pain Assessment: No/denies pain  Home Living                                          Prior Functioning/Environment              Frequency  Min 2X/week        Progress Toward Goals  OT Goals(current goals can now be found in the care plan section)  Progress towards OT goals: Progressing toward goals     Plan      Co-evaluation                 AM-PAC OT "6 Clicks" Daily Activity     Outcome Measure   Help from another person eating meals?: A Little Help from another person taking care of personal grooming?: A Little Help from another person toileting, which includes using toliet, bedpan, or urinal?: Total Help from another person bathing (including washing, rinsing, drying)?: A Lot Help from another person to put on and taking off regular upper body clothing?: A Little Help from another person to put on and taking off regular lower body clothing?: A Lot 6 Click Score: 14    End of Session Equipment Utilized During Treatment: Gait belt;Rolling walker (2 wheels)  OT Visit Diagnosis: Muscle weakness (generalized) (M62.81);Unsteadiness on feet (R26.81);Other abnormalities of gait and mobility (R26.89)   Activity Tolerance Patient tolerated treatment well;Patient limited by fatigue    Patient Left in bed;with call bell/phone within reach;with bed alarm set;with family/visitor present   Nurse Communication Other (comment) (alerted cna pt. needs pure wik to be placed)        Time: 7829-5621 OT Time Calculation (min): 33 min  Charges: OT General Charges $OT Visit: 1 Visit OT Treatments $Self Care/Home Management : 23-37 mins  Howell Macintosh, COTA/L Acute Rehabilitation [phone]   Leory Rands Lorraine-COTA/L  02/17/2024, 10:18 AM

## 2024-02-17 NOTE — TOC Progression Note (Signed)
 Transition of Care [facility]) - Progression Note    Patient Details  Name: [name] MRN: [id number] Date of Birth: November 25, 1940  [facility]) CM/SW Contact  Theda Payer E Donyetta Ogletree, LCSW Phone Number: 02/17/2024, 10:51 AM  Clinical Narrative:    Family now requesting SNF for STR. Per therapy, this would be an appropriate recommendation. Left VM for daughter Jyl Or to get preferences. SNF work up started.       Expected Discharge Plan and Services                                               Social Determinants of Health (SDOH) Interventions SDOH Screenings   Food Insecurity: No Food Insecurity (02/13/2024)  Housing: Low Risk  (02/13/2024)  Transportation Needs: No Transportation Needs (02/13/2024)  Utilities: Not At Risk (02/13/2024)  Depression (PHQ2-9): Low Risk  (11/27/2021)  Social Connections: Moderately Isolated (02/13/2024)  Tobacco Use: Medium Risk (02/12/2024)    Readmission Risk Interventions     No data to display

## 2024-02-17 NOTE — Consult Note (Signed)
 WOC team consulted for sacral/buttocks wound.  Secure chat to primary team requesting photo be uploaded for consultation.   Please note that the [facility] nursing team is utilizing a standardized work plan to manage patient consults. We are triaging consults and will try to see the patients within 48 hours. Wound photos in the patient's chart allow us  to consult on the patient in the most efficient and timely manner.    Thank you,    Ronni Colace MSN, RN-BC, [employer] [phone]

## 2024-02-17 NOTE — NC FL2 (Signed)
 [place]  MEDICAID [id number] LEVEL OF CARE FORM     IDENTIFICATION  Patient Name: [name] Birthdate: [birth year] Sex: female Admission Date (Current Location): 02/12/2024  [facility] and [place] Number:  Producer, television/film/video and Address:  [facility] [place], [address] [place], [place] [address]      Provider Number: [id number]  Attending Physician Name and Address:  Maylene Spear, MD  Relative Name and Phone Number:  Ruby Corporal (Daughter)  [phone] (Mobile)    Current Level of Care: Hospital Recommended Level of Care: Skilled Nursing Facility Prior Approval Number:    Date Approved/Denied:   PASRR Number: [id number]  Discharge Plan:      Current Diagnoses: Patient Active Problem List   Diagnosis Date Noted   Severe sepsis (HCC) 02/12/2024   Pneumonia 02/12/2024   Transaminitis 02/12/2024   Atrial fibrillation with RVR (HCC) 02/12/2024   Metabolic acidosis 02/12/2024   Port-A-Cath in place 05/02/2023   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   Coronary artery disease involving native heart without angina pectoris 04/05/2023   Jaundice 04/04/2023   Cholelithiasis 10/12/2022   Symptomatic cholelithiasis 10/12/2022   Family history of malignant neoplasm of digestive organ 02/17/2022   Gangrenous disorder (HCC) 02/17/2022   Gastro-esophageal reflux disease without esophagitis 02/17/2022   Hyperglycemia due to type 2 diabetes mellitus (HCC) 02/17/2022   Iron  deficiency anemia 02/17/2022   Leukocytosis 02/17/2022   Moderate nonproliferative diabetic retinopathy of both eyes without macular edema associated with type 2 diabetes mellitus (HCC) 02/17/2022   Other acute osteomyelitis, left ankle and foot (HCC) 02/17/2022   Pure hypercholesterolemia 02/17/2022   Skin ulcer of sacrum, limited to breakdown of skin (HCC) 02/17/2022   Thrombocytopenic disorder (HCC) 02/17/2022   Tinnitus 02/17/2022   S/P angiogram of extremity 10/13/2021    Osteomyelitis of foot, left, acute (HCC) 04/27/2021   Cellulitis in diabetic foot (HCC) 04/27/2021   AKI (acute kidney injury) (HCC) 04/27/2021   Hyponatremia 04/27/2021   Symptomatic anemia 04/27/2021   Cellulitis of left lower extremity    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   Critical lower limb ischemia (HCC) 08/03/2019   Encounter for immunization 07/09/2016   Essential hypertension 03/31/2013   Dyslipidemia 03/31/2013   DM2 (diabetes mellitus, type 2) (HCC) 03/31/2013   Overweight (BMI 25.0-29.9) 03/31/2013   Carotid artery disease (HCC) 03/31/2013   PAD (peripheral artery disease) (HCC) 03/31/2013    Orientation RESPIRATION BLADDER Height & Weight     Self, Time, Situation, Place  Normal External catheter Weight: 114 lb (51.7 kg) Height:  5\' 5"  (165.1 cm)  BEHAVIORAL SYMPTOMS/MOOD NEUROLOGICAL BOWEL NUTRITION STATUS      Incontinent Diet (soft)  AMBULATORY STATUS COMMUNICATION OF NEEDS Skin   Limited Assist Verbally Skin abrasions, Bruising, Other (Comment) (stage 2 mid coccyx - foam dressing; pressure injury R buttocks - foam dressing; pressure injuries R back, R thigh, R heel)  Personal Care Assistance Level of Assistance  Bathing, Feeding, Dressing Bathing Assistance: Limited assistance Feeding assistance: Limited assistance Dressing Assistance: Limited assistance     Functional Limitations  Info             SPECIAL CARE FACTORS FREQUENCY  PT (By licensed PT), OT (By licensed OT)     PT Frequency: 5 times per week OT Frequency: 5 times per week            Contractures      Additional Factors Info  Code Status, Allergies Code Status Info: full Allergies Info: Versed  (Midazolam ), Augmentin (Amoxicillin-pot Clavulanate), Bactrim  (Sulfamethoxazole -trimethoprim ), Demerol (Meperidine), Meperidine Hcl, Scopolamine, Tramadol            Current Medications (02/17/2024):  This is the current hospital active medication list Current Facility-Administered Medications  Medication Dose Route Frequency Provider Last Rate Last Admin   aspirin  chewable tablet 81 mg  81 mg Oral QHS Krishnan, Gokul, MD   81 mg at 02/16/24 2118   bismuth subsalicylate (PEPTO BISMOL) 262 MG/15ML suspension 30 mL  30 mL Oral Q4H PRN Sundil, Subrina, MD       brimonidine  (ALPHAGAN ) 0.2 % ophthalmic solution 1 drop  1 drop Right Eye BID Krishnan, Gokul, MD   1 drop at 02/17/24 1026   cefTRIAXone  (ROCEPHIN ) 2 g in sodium chloride  0.9 % 100 mL IVPB  2 g Intravenous Q24H Krishnan, Gokul, MD 200 mL/hr at 02/16/24 1240 2 g at 02/16/24 1240   Chlorhexidine  Gluconate Cloth 2 % PADS 6 each  6 each Topical Daily Krishnan, Gokul, MD   6 each at 02/17/24 1026   doxycycline  (VIBRA -TABS) tablet 100 mg  100 mg Oral Q12H Krishnan, Gokul, MD   100 mg at 02/17/24 1026   feeding supplement (ENSURE ENLIVE / ENSURE PLUS) liquid 237 mL  237 mL Oral BID BM Krishnan, Gokul, MD   237 mL at 02/17/24 1026   insulin  aspart (novoLOG ) injection 0-9 Units  0-9 Units Subcutaneous TID WC Krishnan, Gokul, MD   2 Units at 02/17/24 4696   metoprolol  tartrate (LOPRESSOR ) tablet 25 mg  25 mg Oral BID Krishnan, Gokul, MD   25 mg at 02/17/24 1026   ondansetron  (ZOFRAN ) tablet 4 mg  4 mg Oral Q6H PRN Krishnan, Gokul, MD       Or   ondansetron  (ZOFRAN ) injection 4 mg  4 mg Intravenous Q6H PRN Krishnan, Gokul, MD       Oral care mouth rinse  15  mL Mouth Rinse PRN Krishnan, Gokul, MD       polyethylene glycol (MIRALAX  / GLYCOLAX ) packet 17 g  17 g Oral Daily Krishnan, Gokul, MD   17 g at 02/17/24 1026   Rivaroxaban  (XARELTO ) tablet 15 mg  15 mg Oral Q supper Krishnan, Gokul, MD       senna (SENOKOT) tablet 17.2 mg  2 tablet Oral BID Krishnan, Gokul, MD   17.2 mg at 02/17/24 1026   sodium chloride  flush (NS) 0.9 % injection 10-40 mL  10-40 mL Intracatheter Q12H Maylene Spear, MD   10 mL at 02/17/24 1026   sodium chloride  flush (NS) 0.9 % injection 10-40 mL  10-40 mL Intracatheter PRN Krishnan, Gokul, MD       traZODone  (DESYREL ) tablet 50 mg  50 mg Oral QHS PRN Krishnan, Gokul, MD         Discharge Medications: Please see discharge summary for a list of discharge medications.  Relevant Imaging Results:  Relevant Lab Results:  Additional Information SS #: [id number]  Ihsan Nomura E Dreux Mcgroarty, LCSW

## 2024-02-18 ENCOUNTER — Telehealth (HOSPITAL_COMMUNITY): Payer: Self-pay

## 2024-02-18 ENCOUNTER — Other Ambulatory Visit (HOSPITAL_COMMUNITY): Payer: Self-pay

## 2024-02-18 DIAGNOSIS — E1122 Type 2 diabetes mellitus with diabetic chronic kidney disease: Secondary | ICD-10-CM | POA: Diagnosis present

## 2024-02-18 DIAGNOSIS — Z8739 Personal history of other diseases of the musculoskeletal system and connective tissue: Secondary | ICD-10-CM | POA: Diagnosis not present

## 2024-02-18 DIAGNOSIS — D649 Anemia, unspecified: Secondary | ICD-10-CM | POA: Diagnosis not present

## 2024-02-18 DIAGNOSIS — D500 Iron deficiency anemia secondary to blood loss (chronic): Secondary | ICD-10-CM | POA: Diagnosis not present

## 2024-02-18 DIAGNOSIS — L89310 Pressure ulcer of right buttock, unstageable: Secondary | ICD-10-CM | POA: Diagnosis not present

## 2024-02-18 DIAGNOSIS — I700 Atherosclerosis of aorta: Secondary | ICD-10-CM | POA: Diagnosis not present

## 2024-02-18 DIAGNOSIS — I739 Peripheral vascular disease, unspecified: Secondary | ICD-10-CM | POA: Diagnosis not present

## 2024-02-18 DIAGNOSIS — C787 Secondary malignant neoplasm of liver and intrahepatic bile duct: Secondary | ICD-10-CM | POA: Diagnosis not present

## 2024-02-18 DIAGNOSIS — R609 Edema, unspecified: Secondary | ICD-10-CM | POA: Diagnosis present

## 2024-02-18 DIAGNOSIS — R600 Localized edema: Secondary | ICD-10-CM | POA: Diagnosis not present

## 2024-02-18 DIAGNOSIS — D630 Anemia in neoplastic disease: Secondary | ICD-10-CM | POA: Diagnosis present

## 2024-02-18 DIAGNOSIS — J150 Pneumonia due to Klebsiella pneumoniae: Secondary | ICD-10-CM | POA: Diagnosis not present

## 2024-02-18 DIAGNOSIS — Z87891 Personal history of nicotine dependence: Secondary | ICD-10-CM | POA: Diagnosis not present

## 2024-02-18 DIAGNOSIS — E161 Other hypoglycemia: Secondary | ICD-10-CM | POA: Diagnosis not present

## 2024-02-18 DIAGNOSIS — Z9189 Other specified personal risk factors, not elsewhere classified: Secondary | ICD-10-CM | POA: Diagnosis not present

## 2024-02-18 DIAGNOSIS — K219 Gastro-esophageal reflux disease without esophagitis: Secondary | ICD-10-CM | POA: Diagnosis not present

## 2024-02-18 DIAGNOSIS — I4891 Unspecified atrial fibrillation: Secondary | ICD-10-CM | POA: Diagnosis not present

## 2024-02-18 DIAGNOSIS — I252 Old myocardial infarction: Secondary | ICD-10-CM | POA: Diagnosis not present

## 2024-02-18 DIAGNOSIS — F329 Major depressive disorder, single episode, unspecified: Secondary | ICD-10-CM | POA: Diagnosis not present

## 2024-02-18 DIAGNOSIS — R000 Tachycardia, unspecified: Secondary | ICD-10-CM | POA: Diagnosis not present

## 2024-02-18 DIAGNOSIS — N1831 Chronic kidney disease, stage 3a: Secondary | ICD-10-CM | POA: Diagnosis not present

## 2024-02-18 DIAGNOSIS — Z8509 Personal history of malignant neoplasm of other digestive organs: Secondary | ICD-10-CM | POA: Diagnosis not present

## 2024-02-18 DIAGNOSIS — C249 Malignant neoplasm of biliary tract, unspecified: Secondary | ICD-10-CM | POA: Diagnosis not present

## 2024-02-18 DIAGNOSIS — I10 Essential (primary) hypertension: Secondary | ICD-10-CM | POA: Diagnosis not present

## 2024-02-18 DIAGNOSIS — E119 Type 2 diabetes mellitus without complications: Secondary | ICD-10-CM | POA: Diagnosis not present

## 2024-02-18 DIAGNOSIS — N189 Chronic kidney disease, unspecified: Secondary | ICD-10-CM | POA: Diagnosis not present

## 2024-02-18 DIAGNOSIS — R41841 Cognitive communication deficit: Secondary | ICD-10-CM | POA: Diagnosis not present

## 2024-02-18 DIAGNOSIS — Z9289 Personal history of other medical treatment: Secondary | ICD-10-CM | POA: Diagnosis not present

## 2024-02-18 DIAGNOSIS — L89159 Pressure ulcer of sacral region, unspecified stage: Secondary | ICD-10-CM | POA: Diagnosis not present

## 2024-02-18 DIAGNOSIS — M545 Low back pain, unspecified: Secondary | ICD-10-CM | POA: Diagnosis not present

## 2024-02-18 DIAGNOSIS — C221 Intrahepatic bile duct carcinoma: Secondary | ICD-10-CM | POA: Diagnosis not present

## 2024-02-18 DIAGNOSIS — E113493 Type 2 diabetes mellitus with severe nonproliferative diabetic retinopathy without macular edema, bilateral: Secondary | ICD-10-CM | POA: Diagnosis present

## 2024-02-18 DIAGNOSIS — D631 Anemia in chronic kidney disease: Secondary | ICD-10-CM | POA: Diagnosis not present

## 2024-02-18 DIAGNOSIS — R29810 Facial weakness: Secondary | ICD-10-CM | POA: Diagnosis not present

## 2024-02-18 DIAGNOSIS — K529 Noninfective gastroenteritis and colitis, unspecified: Secondary | ICD-10-CM | POA: Diagnosis not present

## 2024-02-18 DIAGNOSIS — Z9861 Coronary angioplasty status: Secondary | ICD-10-CM | POA: Diagnosis not present

## 2024-02-18 DIAGNOSIS — L8990 Pressure ulcer of unspecified site, unspecified stage: Secondary | ICD-10-CM | POA: Diagnosis not present

## 2024-02-18 DIAGNOSIS — N179 Acute kidney failure, unspecified: Secondary | ICD-10-CM | POA: Diagnosis not present

## 2024-02-18 DIAGNOSIS — M6259 Muscle wasting and atrophy, not elsewhere classified, multiple sites: Secondary | ICD-10-CM | POA: Diagnosis not present

## 2024-02-18 DIAGNOSIS — R2681 Unsteadiness on feet: Secondary | ICD-10-CM | POA: Diagnosis not present

## 2024-02-18 DIAGNOSIS — R7889 Finding of other specified substances, not normally found in blood: Secondary | ICD-10-CM | POA: Diagnosis not present

## 2024-02-18 DIAGNOSIS — Z7401 Bed confinement status: Secondary | ICD-10-CM | POA: Diagnosis not present

## 2024-02-18 DIAGNOSIS — M6281 Muscle weakness (generalized): Secondary | ICD-10-CM | POA: Diagnosis not present

## 2024-02-18 DIAGNOSIS — D6832 Hemorrhagic disorder due to extrinsic circulating anticoagulants: Secondary | ICD-10-CM | POA: Diagnosis present

## 2024-02-18 DIAGNOSIS — E1165 Type 2 diabetes mellitus with hyperglycemia: Secondary | ICD-10-CM | POA: Diagnosis not present

## 2024-02-18 DIAGNOSIS — Z7189 Other specified counseling: Secondary | ICD-10-CM | POA: Diagnosis not present

## 2024-02-18 DIAGNOSIS — D696 Thrombocytopenia, unspecified: Secondary | ICD-10-CM | POA: Diagnosis present

## 2024-02-18 DIAGNOSIS — Z681 Body mass index (BMI) 19 or less, adult: Secondary | ICD-10-CM | POA: Diagnosis not present

## 2024-02-18 DIAGNOSIS — R7401 Elevation of levels of liver transaminase levels: Secondary | ICD-10-CM | POA: Diagnosis not present

## 2024-02-18 DIAGNOSIS — N184 Chronic kidney disease, stage 4 (severe): Secondary | ICD-10-CM | POA: Diagnosis not present

## 2024-02-18 DIAGNOSIS — R6520 Severe sepsis without septic shock: Secondary | ICD-10-CM | POA: Diagnosis not present

## 2024-02-18 DIAGNOSIS — Z79899 Other long term (current) drug therapy: Secondary | ICD-10-CM | POA: Diagnosis not present

## 2024-02-18 DIAGNOSIS — R195 Other fecal abnormalities: Secondary | ICD-10-CM | POA: Diagnosis not present

## 2024-02-18 DIAGNOSIS — M4628 Osteomyelitis of vertebra, sacral and sacrococcygeal region: Secondary | ICD-10-CM | POA: Diagnosis not present

## 2024-02-18 DIAGNOSIS — E785 Hyperlipidemia, unspecified: Secondary | ICD-10-CM | POA: Diagnosis not present

## 2024-02-18 DIAGNOSIS — E875 Hyperkalemia: Secondary | ICD-10-CM | POA: Diagnosis present

## 2024-02-18 DIAGNOSIS — I480 Paroxysmal atrial fibrillation: Secondary | ICD-10-CM | POA: Diagnosis not present

## 2024-02-18 DIAGNOSIS — E43 Unspecified severe protein-calorie malnutrition: Secondary | ICD-10-CM | POA: Diagnosis present

## 2024-02-18 DIAGNOSIS — K5909 Other constipation: Secondary | ICD-10-CM | POA: Diagnosis not present

## 2024-02-18 DIAGNOSIS — J189 Pneumonia, unspecified organism: Secondary | ICD-10-CM | POA: Diagnosis not present

## 2024-02-18 DIAGNOSIS — C23 Malignant neoplasm of gallbladder: Secondary | ICD-10-CM | POA: Diagnosis present

## 2024-02-18 DIAGNOSIS — E876 Hypokalemia: Secondary | ICD-10-CM | POA: Diagnosis not present

## 2024-02-18 DIAGNOSIS — R4182 Altered mental status, unspecified: Secondary | ICD-10-CM | POA: Diagnosis not present

## 2024-02-18 DIAGNOSIS — A419 Sepsis, unspecified organism: Secondary | ICD-10-CM | POA: Diagnosis not present

## 2024-02-18 DIAGNOSIS — Z923 Personal history of irradiation: Secondary | ICD-10-CM | POA: Diagnosis not present

## 2024-02-18 DIAGNOSIS — L98429 Non-pressure chronic ulcer of back with unspecified severity: Secondary | ICD-10-CM | POA: Diagnosis present

## 2024-02-18 DIAGNOSIS — R634 Abnormal weight loss: Secondary | ICD-10-CM | POA: Diagnosis not present

## 2024-02-18 DIAGNOSIS — Z452 Encounter for adjustment and management of vascular access device: Secondary | ICD-10-CM | POA: Diagnosis not present

## 2024-02-18 DIAGNOSIS — I129 Hypertensive chronic kidney disease with stage 1 through stage 4 chronic kidney disease, or unspecified chronic kidney disease: Secondary | ICD-10-CM | POA: Diagnosis not present

## 2024-02-18 DIAGNOSIS — D638 Anemia in other chronic diseases classified elsewhere: Secondary | ICD-10-CM | POA: Diagnosis not present

## 2024-02-18 DIAGNOSIS — Z83438 Family history of other disorder of lipoprotein metabolism and other lipidemia: Secondary | ICD-10-CM | POA: Diagnosis not present

## 2024-02-18 DIAGNOSIS — R531 Weakness: Secondary | ICD-10-CM | POA: Diagnosis not present

## 2024-02-18 DIAGNOSIS — R630 Anorexia: Secondary | ICD-10-CM | POA: Diagnosis not present

## 2024-02-18 DIAGNOSIS — G4700 Insomnia, unspecified: Secondary | ICD-10-CM | POA: Diagnosis not present

## 2024-02-18 DIAGNOSIS — R1311 Dysphagia, oral phase: Secondary | ICD-10-CM | POA: Diagnosis not present

## 2024-02-18 DIAGNOSIS — Z743 Need for continuous supervision: Secondary | ICD-10-CM | POA: Diagnosis not present

## 2024-02-18 DIAGNOSIS — I6521 Occlusion and stenosis of right carotid artery: Secondary | ICD-10-CM | POA: Diagnosis not present

## 2024-02-18 DIAGNOSIS — R2689 Other abnormalities of gait and mobility: Secondary | ICD-10-CM | POA: Diagnosis not present

## 2024-02-18 DIAGNOSIS — E1151 Type 2 diabetes mellitus with diabetic peripheral angiopathy without gangrene: Secondary | ICD-10-CM | POA: Diagnosis present

## 2024-02-18 DIAGNOSIS — E1142 Type 2 diabetes mellitus with diabetic polyneuropathy: Secondary | ICD-10-CM | POA: Diagnosis present

## 2024-02-18 DIAGNOSIS — Z794 Long term (current) use of insulin: Secondary | ICD-10-CM | POA: Diagnosis not present

## 2024-02-18 DIAGNOSIS — Z8522 Personal history of malignant neoplasm of nasal cavities, middle ear, and accessory sinuses: Secondary | ICD-10-CM | POA: Diagnosis not present

## 2024-02-18 DIAGNOSIS — N183 Chronic kidney disease, stage 3 unspecified: Secondary | ICD-10-CM | POA: Diagnosis not present

## 2024-02-18 DIAGNOSIS — Z7901 Long term (current) use of anticoagulants: Secondary | ICD-10-CM | POA: Diagnosis not present

## 2024-02-18 DIAGNOSIS — E11649 Type 2 diabetes mellitus with hypoglycemia without coma: Secondary | ICD-10-CM | POA: Diagnosis present

## 2024-02-18 DIAGNOSIS — I482 Chronic atrial fibrillation, unspecified: Secondary | ICD-10-CM | POA: Diagnosis not present

## 2024-02-18 DIAGNOSIS — L89156 Pressure-induced deep tissue damage of sacral region: Secondary | ICD-10-CM | POA: Diagnosis not present

## 2024-02-18 DIAGNOSIS — D509 Iron deficiency anemia, unspecified: Secondary | ICD-10-CM | POA: Diagnosis present

## 2024-02-18 DIAGNOSIS — D6859 Other primary thrombophilia: Secondary | ICD-10-CM | POA: Diagnosis not present

## 2024-02-18 DIAGNOSIS — Z87898 Personal history of other specified conditions: Secondary | ICD-10-CM | POA: Diagnosis not present

## 2024-02-18 DIAGNOSIS — E113393 Type 2 diabetes mellitus with moderate nonproliferative diabetic retinopathy without macular edema, bilateral: Secondary | ICD-10-CM | POA: Diagnosis not present

## 2024-02-18 DIAGNOSIS — L89150 Pressure ulcer of sacral region, unstageable: Secondary | ICD-10-CM | POA: Diagnosis not present

## 2024-02-18 DIAGNOSIS — I2510 Atherosclerotic heart disease of native coronary artery without angina pectoris: Secondary | ICD-10-CM | POA: Diagnosis not present

## 2024-02-18 DIAGNOSIS — Z95828 Presence of other vascular implants and grafts: Secondary | ICD-10-CM | POA: Diagnosis not present

## 2024-02-18 DIAGNOSIS — E1169 Type 2 diabetes mellitus with other specified complication: Secondary | ICD-10-CM | POA: Diagnosis present

## 2024-02-18 DIAGNOSIS — D62 Acute posthemorrhagic anemia: Secondary | ICD-10-CM | POA: Diagnosis present

## 2024-02-18 DIAGNOSIS — G479 Sleep disorder, unspecified: Secondary | ICD-10-CM | POA: Diagnosis not present

## 2024-02-18 DIAGNOSIS — Z8249 Family history of ischemic heart disease and other diseases of the circulatory system: Secondary | ICD-10-CM | POA: Diagnosis not present

## 2024-02-18 DIAGNOSIS — R52 Pain, unspecified: Secondary | ICD-10-CM | POA: Diagnosis not present

## 2024-02-18 DIAGNOSIS — E162 Hypoglycemia, unspecified: Secondary | ICD-10-CM | POA: Diagnosis not present

## 2024-02-18 DIAGNOSIS — D8481 Immunodeficiency due to conditions classified elsewhere: Secondary | ICD-10-CM | POA: Diagnosis not present

## 2024-02-18 DIAGNOSIS — Z872 Personal history of diseases of the skin and subcutaneous tissue: Secondary | ICD-10-CM | POA: Diagnosis not present

## 2024-02-18 DIAGNOSIS — L89154 Pressure ulcer of sacral region, stage 4: Secondary | ICD-10-CM | POA: Diagnosis not present

## 2024-02-18 DIAGNOSIS — I959 Hypotension, unspecified: Secondary | ICD-10-CM | POA: Diagnosis not present

## 2024-02-18 DIAGNOSIS — R197 Diarrhea, unspecified: Secondary | ICD-10-CM | POA: Diagnosis not present

## 2024-02-18 DIAGNOSIS — Z8719 Personal history of other diseases of the digestive system: Secondary | ICD-10-CM | POA: Diagnosis not present

## 2024-02-18 DIAGNOSIS — K644 Residual hemorrhoidal skin tags: Secondary | ICD-10-CM | POA: Diagnosis not present

## 2024-02-18 DIAGNOSIS — Z7984 Long term (current) use of oral hypoglycemic drugs: Secondary | ICD-10-CM | POA: Diagnosis not present

## 2024-02-18 DIAGNOSIS — Z9221 Personal history of antineoplastic chemotherapy: Secondary | ICD-10-CM | POA: Diagnosis not present

## 2024-02-18 DIAGNOSIS — Z951 Presence of aortocoronary bypass graft: Secondary | ICD-10-CM | POA: Diagnosis not present

## 2024-02-18 LAB — CBC
HCT: 33.6 % — ABNORMAL LOW (ref 36.0–46.0)
Hemoglobin: 11.2 g/dL — ABNORMAL LOW (ref 12.0–15.0)
MCH: 34.7 pg — ABNORMAL HIGH (ref 26.0–34.0)
MCHC: 33.3 g/dL (ref 30.0–36.0)
MCV: 104 fL — ABNORMAL HIGH (ref 80.0–100.0)
Platelets: 99 10*3/uL — ABNORMAL LOW (ref 150–400)
RBC: 3.23 MIL/uL — ABNORMAL LOW (ref 3.87–5.11)
RDW: 18.3 % — ABNORMAL HIGH (ref 11.5–15.5)
WBC: 7.7 10*3/uL (ref 4.0–10.5)
nRBC: 0 % (ref 0.0–0.2)

## 2024-02-18 LAB — COMPREHENSIVE METABOLIC PANEL WITH GFR
ALT: 263 U/L — ABNORMAL HIGH (ref 0–44)
AST: 92 U/L — ABNORMAL HIGH (ref 15–41)
Albumin: 1.8 g/dL — ABNORMAL LOW (ref 3.5–5.0)
Alkaline Phosphatase: 158 U/L — ABNORMAL HIGH (ref 38–126)
Anion gap: 6 (ref 5–15)
BUN: 25 mg/dL — ABNORMAL HIGH (ref 8–23)
CO2: 23 mmol/L (ref 22–32)
Calcium: 9.1 mg/dL (ref 8.9–10.3)
Chloride: 109 mmol/L (ref 98–111)
Creatinine, Ser: 0.92 mg/dL (ref 0.44–1.00)
GFR, Estimated: >60 mL/min (ref >60)
Glucose, Bld: 169 mg/dL — ABNORMAL HIGH (ref 70–99)
Potassium: 4.2 mmol/L (ref 3.5–5.1)
Sodium: 138 mmol/L (ref 135–145)
Total Bilirubin: 0.4 mg/dL (ref 0.0–1.2)
Total Protein: 4.2 g/dL — ABNORMAL LOW (ref 6.5–8.1)

## 2024-02-18 LAB — GLUCOSE, CAPILLARY
Glucose-Capillary: 154 mg/dL — ABNORMAL HIGH (ref 70–99)
Glucose-Capillary: 210 mg/dL — ABNORMAL HIGH (ref 70–99)

## 2024-02-18 MED ORDER — HEPARIN SOD (PORK) LOCK FLUSH 100 UNIT/ML IV SOLN
500.0000 [IU] | INTRAVENOUS | Status: AC | PRN
  Administered 2024-02-18: 500 [IU]

## 2024-02-18 MED ORDER — RIVAROXABAN 15 MG PO TABS: 15.0000 mg | ORAL_TABLET | Freq: Every day | ORAL | Status: DC

## 2024-02-18 MED ORDER — METOPROLOL TARTRATE 25 MG PO TABS
25.0000 mg | ORAL_TABLET | Freq: Two times a day (BID) | ORAL | Status: DC
Start: 2024-02-18 — End: 2024-06-18

## 2024-02-18 MED ORDER — ENSURE ENLIVE PO LIQD: 237.0000 mL | Freq: Two times a day (BID) | ORAL | Status: DC

## 2024-02-18 MED ORDER — SENNA 8.6 MG PO TABS: 2.0000 | ORAL_TABLET | Freq: Two times a day (BID) | ORAL | Status: DC

## 2024-02-18 MED ORDER — POLYETHYLENE GLYCOL 3350 17 G PO PACK: 17.0000 g | PACK | Freq: Every day | ORAL | Status: DC

## 2024-02-18 MED ORDER — TRAZODONE HCL 50 MG PO TABS: 50.0000 mg | ORAL_TABLET | Freq: Every evening | ORAL | Status: DC | PRN

## 2024-02-18 NOTE — TOC Transition Note (Signed)
 Transition of Care [facility] - Discharge Note   Patient Details  Name: [name] MRN: [id number] Date of Birth: 1941/09/09  [facility]) CM/SW Contact:  Katrinka Parr, LCSW Phone Number: 02/18/2024, 2:52 PM   Clinical Narrative:      Per MD patient ready for DC to [facility]. RN, patient, patient's family, and facility notified of DC. Discharge Summary and FL2 sent to facility. RN to call report prior to discharge [phone]). DC packet on chart. Ambulance transport requested for patient.   CSW will sign off for now as social work intervention is no longer needed. Please consult us  again if new needs arise.    Final next level of care: Skilled Nursing Facility Barriers to Discharge: No Barriers Identified   Patient Goals and CMS Choice Patient states their goals for this hospitalization and ongoing recovery are:: Rehab          Discharge Placement              Patient chooses bed at: [facility] Patient to be transferred to facility by: Harvell,Tiffany (Daughter) [phone] (Mobile) Name of family member notified: PTAR Patient and family notified of of transfer: 02/18/24  Discharge Plan and Services Additional resources added to the After Visit Summary for                                       Social Drivers of Health (SDOH) Interventions SDOH Screenings   Food Insecurity: No Food Insecurity (02/13/2024)  Housing: Low Risk  (02/13/2024)  Transportation Needs: No Transportation Needs (02/13/2024)  Utilities: Not At Risk (02/13/2024)  Depression (PHQ2-9): Low Risk  (11/27/2021)  Social Connections: Moderately Isolated (02/13/2024)  Tobacco Use: Medium Risk (02/12/2024)     Readmission Risk Interventions     No data to display

## 2024-02-18 NOTE — Progress Notes (Signed)
 [name]   DOB:10-Jul-1941   ZO#:[id number]   JWJ#:[id number]  Medical oncology follow-up  Subjective: Patient feels very fatigued and weak, not able to get out of bed by herself.  Her daughter is at the bedside.  She is awaiting for SNF placement.   Objective:  Vitals:   02/18/24 0619 02/18/24 0746  BP: (!) 142/61 (!) 148/54  Pulse: 82 86  Resp: 17 16  Temp: 97.8 F (36.6 C) 97.7 F (36.5 C)  SpO2:  98%    Body mass index is 18.97 kg/m.  Intake/Output Summary (Last 24 hours) at 02/18/2024 1010 Last data filed at 02/18/2024 6213 Gross per 24 hour  Intake 670 ml  Output 300 ml  Net 370 ml     Sclerae unicteric  Oropharynx clear  No peripheral adenopathy  MSK no focal spinal tenderness, no peripheral edema  Neuro nonfocal    CBG (last 3)  Recent Labs    02/17/24 1707 02/17/24 2217 02/18/24 0743  GLUCAP 218* 222* 154*     Labs:  Lab Results  Component Value Date   WBC 7.7 02/18/2024   HGB 11.2 (L) 02/18/2024   HCT 33.6 (L) 02/18/2024   MCV 104.0 (H) 02/18/2024   PLT 99 (L) 02/18/2024   NEUTROABS 12.9 (H) 02/12/2024      Urine Studies No results for input(s): "UHGB", "CRYS" in the last 72 hours.  Invalid input(s): "UACOL", "UAPR", "USPG", "UPH", "UTP", "UGL", "UKET", "UBIL", "UNIT", "UROB", "ULEU", "UEPI", "UWBC", "URBC", "UBAC", "CAST", "UCOM", "BILUA"  Basic Metabolic Panel: Recent Labs  Lab 02/14/24 0901 02/15/24 1400 02/16/24 0947 02/17/24 0500 02/18/24 0200  NA 140 140 141 139 138  K 3.9 3.9 3.8 4.0 4.2  CL 111 108 111 111 109  CO2 22 22 22 23 23   GLUCOSE 78 182* 226* 172* 169*  BUN 46* 35* 28* 27* 25*  CREATININE 1.08* 0.91 0.91 0.90 0.92  CALCIUM  9.2 9.4 9.5 9.2 9.1   GFR Estimated Creatinine Clearance: 39.1 mL/min (by C-G formula based on SCr of 0.92 mg/dL). Liver Function Tests: Recent Labs  Lab 02/14/24 0901 02/15/24 1400 02/16/24 0947 02/17/24 0500 02/18/24 0200  AST 1,133* 755* 375* 170* 92*  ALT 674* 740* 562* 374*  263*  ALKPHOS 107 190* 174* 178* 158*  BILITOT 0.7 0.8 0.8 0.6 0.4  PROT 4.6* 4.8* 4.8* 4.4* 4.2*  ALBUMIN  1.8* 2.0* 2.0* 1.9* 1.8*   No results for input(s): "LIPASE", "AMYLASE" in the last 168 hours. No results for input(s): "AMMONIA" in the last 168 hours. Coagulation profile Recent Labs  Lab 02/12/24 1330 02/13/24 0608  INR 2.1* 2.1*    CBC: Recent Labs  Lab 02/12/24 1330 02/13/24 0608 02/14/24 0901 02/15/24 1400 02/16/24 0947 02/17/24 0500 02/18/24 0200  WBC 14.0*   < > 13.4* 10.4 9.5 8.1 7.7  NEUTROABS 12.9*  --   --   --   --   --   --   HGB 12.0   < > 11.8* 12.3 12.4 11.6* 11.2*  HCT 35.8*   < > 34.3* 36.8 37.6 35.9* 33.6*  MCV 105.0*   < > 101.5* 102.8* 102.7* 103.2* 104.0*  PLT 224   < > 119* 105* 121* 100* 99*   < > = values in this interval not displayed.   Cardiac Enzymes: No results for input(s): "CKTOTAL", "CKMB", "CKMBINDEX", "TROPONINI" in the last 168 hours. BNP: Invalid input(s): "POCBNP" CBG: Recent Labs  Lab 02/17/24 0803 02/17/24 1151 02/17/24 1707 02/17/24 2217 02/18/24 0743  GLUCAP  160* 230* 218* 222* 154*   D-Dimer No results for input(s): "DDIMER" in the last 72 hours. Hgb A1c No results for input(s): "HGBA1C" in the last 72 hours. Lipid Profile No results for input(s): "CHOL", "HDL", "LDLCALC", "TRIG", "CHOLHDL", "LDLDIRECT" in the last 72 hours. Thyroid  function studies No results for input(s): "TSH", "T4TOTAL", "T3FREE", "THYROIDAB" in the last 72 hours.  Invalid input(s): "FREET3" Anemia work up No results for input(s): "VITAMINB12", "FOLATE", "FERRITIN", "TIBC", "IRON ", "RETICCTPCT" in the last 72 hours. Microbiology Recent Results (from the past 240 hours)  Blood Culture (routine x 2)     Status: None   Collection Time: 02/12/24  1:25 PM   Specimen: BLOOD RIGHT FOREARM  Result Value Ref Range Status   Specimen Description BLOOD RIGHT FOREARM  Final   Special Requests   Final    BOTTLES DRAWN AEROBIC AND ANAEROBIC  Blood Culture adequate volume   Culture   Final    NO GROWTH 5 DAYS Performed at [facility], [address] [place], [place] [address]    Report Status 02/17/2024 FINAL  Final  Blood Culture (routine x 2)     Status: None   Collection Time: 02/12/24  1:32 PM   Specimen: BLOOD LEFT FOREARM  Result Value Ref Range Status   Specimen Description BLOOD LEFT FOREARM  Final   Special Requests   Final    BOTTLES DRAWN AEROBIC AND ANAEROBIC Blood Culture adequate volume   Culture   Final    NO GROWTH 5 DAYS Performed at [facility], [address] [place], [place] [address]    Report Status 02/17/2024 FINAL  Final  Resp panel by RT-PCR (RSV, Flu A&B, Covid) Anterior Nasal Swab     Status: None   Collection Time: 02/12/24  1:39 PM   Specimen: Anterior Nasal Swab  Result Value Ref Range Status   SARS Coronavirus 2 by RT PCR NEGATIVE NEGATIVE Final   Influenza A by PCR NEGATIVE NEGATIVE Final   Influenza B by PCR NEGATIVE NEGATIVE Final    Comment: (NOTE) The Xpert Xpress SARS-CoV-2/FLU/RSV plus assay is intended as an aid in the diagnosis of influenza from Nasopharyngeal swab specimens and should not be used as a sole basis for treatment. Nasal washings and aspirates are unacceptable for Xpert Xpress SARS-CoV-2/FLU/RSV testing.  Fact Sheet for Patients: BloggerCourse.com  Fact Sheet for Healthcare Providers: SeriousBroker.it  This test is not yet approved or cleared by the [place]  FDA and has been authorized for detection and/or diagnosis of SARS-CoV-2 by FDA under an Emergency Use Authorization (EUA). This EUA will remain in effect (meaning this test can be used) for the duration of the COVID-19 declaration under Section 564(b)(1) of the Act, 21 U.S.C. section 360bbb-3(b)(1), unless the authorization is terminated or revoked.     Resp Syncytial Virus by PCR NEGATIVE NEGATIVE Final    Comment: (NOTE) Fact  Sheet for Patients: BloggerCourse.com  Fact Sheet for Healthcare Providers: SeriousBroker.it  This test is not yet approved or cleared by the [place]  FDA and has been authorized for detection and/or diagnosis of SARS-CoV-2 by FDA under an Emergency Use Authorization (EUA). This EUA will remain in effect (meaning this test can be used) for the duration of the COVID-19 declaration under Section 564(b)(1) of the Act, 21 U.S.C. section 360bbb-3(b)(1), unless the authorization is terminated or revoked.  Performed at [facility] Lab, [address], [place], [place] [address]   Urine Culture     Status:  Abnormal   Collection Time: 02/12/24  1:50 PM   Specimen: Urine, Random  Result Value Ref Range Status   Specimen Description URINE, RANDOM  Final   Special Requests   Final    NONE Reflexed from [id number] Performed at [facility], [address], [place], [place] [address]    Culture 70,000 COLONIES/mL KLEBSIELLA PNEUMONIAE (A)  Final   Report Status 02/15/2024 FINAL  Final   Organism ID, Bacteria KLEBSIELLA PNEUMONIAE (A)  Final      Susceptibility   Klebsiella pneumoniae - MIC*    AMPICILLIN >=32 RESISTANT Resistant     CEFAZOLIN  <=4 SENSITIVE Sensitive     CEFEPIME <=0.12 SENSITIVE Sensitive     CEFTRIAXONE  <=0.25 SENSITIVE Sensitive     CIPROFLOXACIN  <=0.25 SENSITIVE Sensitive     GENTAMICIN <=1 SENSITIVE Sensitive     IMIPENEM <=0.25 SENSITIVE Sensitive     NITROFURANTOIN <=16 SENSITIVE Sensitive     TRIMETH /SULFA  <=20 SENSITIVE Sensitive     AMPICILLIN/SULBACTAM 4 SENSITIVE Sensitive     PIP/TAZO <=4 SENSITIVE Sensitive ug/mL    * 70,000 COLONIES/mL KLEBSIELLA PNEUMONIAE      Studies:  No results found.  Assessment: 83 y.o.  pneumonia most likely aspiration pneumonia with sepsis, much improved AKI, resolved Paroxysmal atrial fibrillation Unresectable gallbladder cancer, status post chemo and  radiation, on immunotherapy CAD and hypertension Decubital ulcer Deconditioning   Plan:  - Patient has overall improved in terms of pneumonia, I personally reviewed her CT chest images, most consistent with aspiration pneumonia, no high concern for pneumonitis from immunotherapy. - She however is very deconditioned, I agree with SNF placement for rehab - I told the patient to call me when she is ready to leave rehab and I will see her back in the office. - Will hold on her immunotherapy for now.   Sonja [place], MD 02/18/2024  10:10 AM

## 2024-02-18 NOTE — Progress Notes (Signed)
 Ted hose placed and patient not comfortable. Preferred to have SCD in place.

## 2024-02-18 NOTE — Progress Notes (Signed)
 [facility] PROGRESS NOTE   [name] GMW:[id number] DOB: 07-05-1941 DOA: 02/12/2024  PCP: Ransom Byers, MD  Brief History: 83 y.o. female with a past medical history of essential hypertension, diabetes mellitus type 2, coronary artery disease, peripheral artery disease status post multiple interventions previously, status post CABG previously who is under the care of cancer Center for history of gallbladder cancer.  She has completed radiation treatment.  She has also continue treatment with the capecitabine .  She continues to get treatment with durvalumab .  She completed radiation treatment in April.  Her last infusion with the durvalumab  was on April 23.  Patient missed her appointments at the cancer center.  When daughter went to check on her she was lying on the bed and was noted to be confused.  She was brought into the hospital.  She was noted to be febrile.  Noted to have pneumonia.  She was hospitalized for further management.     Consultants: None  Procedures: None    Subjective/Interval History: No episodes of bleeding per rectum noted.  Was noted to be somewhat orthostatic with physical therapy yesterday.  Otherwise she feels well.  Looking forward to going to rehab.  Daughter is at the bedside.   Assessment/Plan:  Pneumonia most likely aspiration pneumonia with severe sepsis, present on admission Patient was febrile in the ED with elevated WBC.  She also had lactic acidosis with initial level of 7.8.  Improvement noted. COVID-19 influenza and RSV PCR negative.  CT suggested right lung pneumonia.  Patient likely aspirated as she mentioned episodes of nausea and vomiting few days prior to admission. Patient was quite tachycardic at admission.  She met criteria for severe sepsis due to AKI. Patient has clinically improved.  WBC is better. Antibiotics were changed over to ceftriaxone  and doxycycline .  Blood cultures negative so far.  Surprisingly urine culture  grew 70,000 colonies of Klebsiella.  However she never had any symptoms suggesting UTI.  Ceftriaxone  should cover this organism. She is noted to have intolerance to Augmentin.  But hesitate using fluoroquinolones.  She is also intolerant to Bactrim .  Will leave her on ceftriaxone  for now.  She will complete 5-day course today. Seen by PT and OT.  Initially home health was being considered however now recommendation has been changed to skilled nursing facility for short-term rehab.  Patient lives by herself so this is a reasonable option for her.   Acute kidney injury with metabolic acidosis/hypokalemia This is in the setting of nausea vomiting.  Noted to be on diuretic and ARB at home.  Also was on metformin  at home.   Patient was hydrated.  Renal function is back to baseline now.  IV fluids discontinued.  Paroxysmal atrial fibrillation with RVR No previous history of atrial fibrillation.  Likely triggered by sepsis.   TSH noted to be 0.112.  Free T4 is 1.16.  No change in treatment plan.  Will recommend thyroid  function test be rechecked in a few weeks. Echocardiogram shows normal systolic function with EF of 55 to 60%. CHADS2 vascular score is 6.  Anticoagulation discussed.  Patient was on low-dose Xarelto  previously.  Agreeable to therapeutic dose Xarelto .  This has been initiated. Aspirin  can be discontinued. Continue metoprolol .  Remains in sinus rhythm. Outpatient follow-up with cardiology.  She is followed by Dr. Katheryne Pane.  Hematochezia Some blood was noted on the toilet paper after she had a bowel movement on 5/25.Aaron Aas  Rectal exam does not show any overt bleeding.  External hemorrhoids noted.   Small amount of bleeding was in the setting of constipation and straining.  Hemoglobin is stable.  No recurrence.  Continue with rivaroxaban .    Transaminitis Worsening LFTs most likely due to severe sepsis.  Bilirubin and alkaline phosphatase levels were normal.   LFTs continue to improve and  anticipate they will return to baseline in a few days.   Statin is currently on hold. Recently checked hepatitis panel in 2024 was unremarkable. LFTs have improved.  No need to check them on a daily basis going forward.   Diabetes mellitus type 2 Noted to be on metformin  Jardiance  and Prandin  prior to admission.  No longer takes NovoLog .   HbA1c 4.9. Monitor CBGs.  Thrombocytopenia Most likely secondary to sepsis.  Previous labs reviewed.  She has had mild thrombocytopenia in the past as well.  Anticipate that her platelet counts will improve eventually.  Gallbladder cancer Followed by medical oncology.  Has completed radiation treatment.  Remains on immune treatment.  Holding these medications for now.   She does have a biliary stent.  Bilirubin is normal.  Elevated AST ALT probably due to sepsis and noted to be improving.   Coronary artery disease status post CABG/hypertension Cardiac status is stable.  Continue to monitor. Orthostatic hypotension noted when she worked with physical therapy.  Likely due to deconditioning as well.  At this time she is only on metoprolol .  Her HCTZ and ARB are on hold.  Continue to hold them at discharge and they can be resumed once patient has improved from her deconditioning.   Peripheral artery disease She has had angioplasties to lower extremity blood vessels previously.  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  Currently not an active issue.    Constipation CT of the abdomen pelvis suggest significant constipation.  TSH noted to be low. Has had multiple bowel movement with bowel regimen.  Continue with laxatives.  DVT Prophylaxis: Xarelto  Code Status: Full code Family Communication: Discussed with patient and her daughter Disposition Plan: SNF     Medications: Scheduled:  brimonidine   1 drop Right Eye BID   Chlorhexidine  Gluconate Cloth  6 each Topical Daily   doxycycline   100 mg Oral Q12H   feeding supplement  237 mL Oral BID BM    insulin  aspart  0-9 Units Subcutaneous TID WC   metoprolol  tartrate  25 mg Oral BID   polyethylene glycol  17 g Oral Daily   Rivaroxaban   15 mg Oral Q supper   senna  2 tablet Oral BID   sodium chloride  flush  10-40 mL Intracatheter Q12H   Continuous:  cefTRIAXone  (ROCEPHIN )  IV Stopped (02/17/24 1309)   NWG:NFAOZHY subsalicylate, ondansetron  **OR** ondansetron  (ZOFRAN ) IV, mouth rinse, sodium chloride  flush, traZODone   Antibiotics: Anti-infectives (From admission, onward)    Start     Dose/Rate Route Frequency Ordered Stop   02/16/24 2000  doxycycline  (VIBRA -TABS) tablet 100 mg        100 mg Oral Every 12 hours 02/16/24 0958 02/19/24 0959   02/14/24 1145  cefTRIAXone  (ROCEPHIN ) 2 g in sodium chloride  0.9 % 100 mL IVPB        2 g 200 mL/hr over 30 Minutes Intravenous Every 24 hours 02/14/24 1048 02/19/24 1144   02/14/24 1145  doxycycline  (VIBRA -TABS) tablet 100 mg        100 mg Oral Every 12 hours 02/14/24 1048 02/16/24 0844   02/13/24 1400  ceFEPIme (MAXIPIME) 2 g in sodium chloride  0.9 %  100 mL IVPB  Status:  Discontinued        2 g 200 mL/hr over 30 Minutes Intravenous Every 24 hours 02/12/24 1746 02/14/24 1048   02/13/24 1400  vancomycin  (VANCOREADY) IVPB 500 mg/100 mL  Status:  Discontinued        500 mg 100 mL/hr over 60 Minutes Intravenous Every 24 hours 02/13/24 1321 02/14/24 1048   02/12/24 1746  vancomycin  variable dose per unstable renal function (pharmacist dosing)  Status:  Discontinued         Does not apply See admin instructions 02/12/24 1746 02/13/24 1321   02/12/24 1730  vancomycin  (VANCOCIN ) IVPB 1000 mg/200 mL premix  Status:  Discontinued        1,000 mg 200 mL/hr over 60 Minutes Intravenous  Once 02/12/24 1722 02/12/24 1722   02/12/24 1730  ceFEPIme (MAXIPIME) 2 g in sodium chloride  0.9 % 100 mL IVPB  Status:  Discontinued        2 g 200 mL/hr over 30 Minutes Intravenous  Once 02/12/24 1722 02/12/24 1722   02/12/24 1345  ceFEPIme (MAXIPIME) 2 g in sodium  chloride 0.9 % 100 mL IVPB        2 g 200 mL/hr over 30 Minutes Intravenous  Once 02/12/24 1332 02/12/24 1439   02/12/24 1345  metroNIDAZOLE  (FLAGYL ) IVPB 500 mg        500 mg 100 mL/hr over 60 Minutes Intravenous  Once 02/12/24 1332 02/12/24 1600   02/12/24 1345  vancomycin  (VANCOCIN ) IVPB 1000 mg/200 mL premix        1,000 mg 200 mL/hr over 60 Minutes Intravenous  Once 02/12/24 1332 02/12/24 1541       Objective:  Vital Signs  Vitals:   02/17/24 1706 02/17/24 2023 02/18/24 0619 02/18/24 0746  BP: (!) 135/54 (!) 120/51 (!) 142/61 (!) 148/54  Pulse: 90 91 82 86  Resp: 17 17 17 16   Temp: (!) 97.5 F (36.4 C) 98 F (36.7 C) 97.8 F (36.6 C) 97.7 F (36.5 C)  TempSrc: Oral Oral Oral Oral  SpO2: 100% 100%  98%  Weight:      Height:        Intake/Output Summary (Last 24 hours) at 02/18/2024 0915 Last data filed at 02/18/2024 7829 Gross per 24 hour  Intake 670 ml  Output 300 ml  Net 370 ml   Filed Weights   02/12/24 1407 02/13/24 1115  Weight: 51.7 kg 51.7 kg    General appearance: Awake alert.  In no distress Resp: Clear to auscultation bilaterally.  Normal effort Cardio: S1-S2 is normal regular.  No S3-S4.  No rubs murmurs or bruit GI: Abdomen is soft.  Nontender nondistended.  Bowel sounds are present normal.  No masses organomegaly   Lab Results:  Data Reviewed: I have personally reviewed following labs and reports of the imaging studies  CBC: Recent Labs  Lab 02/12/24 1330 02/13/24 0608 02/14/24 0901 02/15/24 1400 02/16/24 0947 02/17/24 0500 02/18/24 0200  WBC 14.0*   < > 13.4* 10.4 9.5 8.1 7.7  NEUTROABS 12.9*  --   --   --   --   --   --   HGB 12.0   < > 11.8* 12.3 12.4 11.6* 11.2*  HCT 35.8*   < > 34.3* 36.8 37.6 35.9* 33.6*  MCV 105.0*   < > 101.5* 102.8* 102.7* 103.2* 104.0*  PLT 224   < > 119* 105* 121* 100* 99*   < > = values in this interval not  displayed.    Basic Metabolic Panel: Recent Labs  Lab 02/14/24 0901 02/15/24 1400  02/16/24 0947 02/17/24 0500 02/18/24 0200  NA 140 140 141 139 138  K 3.9 3.9 3.8 4.0 4.2  CL 111 108 111 111 109  CO2 22 22 22 23 23   GLUCOSE 78 182* 226* 172* 169*  BUN 46* 35* 28* 27* 25*  CREATININE 1.08* 0.91 0.91 0.90 0.92  CALCIUM  9.2 9.4 9.5 9.2 9.1    GFR: Estimated Creatinine Clearance: 39.1 mL/min (by C-G formula based on SCr of 0.92 mg/dL).  Liver Function Tests: Recent Labs  Lab 02/14/24 0901 02/15/24 1400 02/16/24 0947 02/17/24 0500 02/18/24 0200  AST 1,133* 755* 375* 170* 92*  ALT 674* 740* 562* 374* 263*  ALKPHOS 107 190* 174* 178* 158*  BILITOT 0.7 0.8 0.8 0.6 0.4  PROT 4.6* 4.8* 4.8* 4.4* 4.2*  ALBUMIN  1.8* 2.0* 2.0* 1.9* 1.8*    Coagulation Profile: Recent Labs  Lab 02/12/24 1330 02/13/24 0608  INR 2.1* 2.1*    CBG: Recent Labs  Lab 02/17/24 0803 02/17/24 1151 02/17/24 1707 02/17/24 2217 02/18/24 0743  GLUCAP 160* 230* 218* 222* 154*    Recent Results (from the past 240 hours)  Blood Culture (routine x 2)     Status: None   Collection Time: 02/12/24  1:25 PM   Specimen: BLOOD RIGHT FOREARM  Result Value Ref Range Status   Specimen Description BLOOD RIGHT FOREARM  Final   Special Requests   Final    BOTTLES DRAWN AEROBIC AND ANAEROBIC Blood Culture adequate volume   Culture   Final    NO GROWTH 5 DAYS Performed at [facility], [address], [place], [place] [address]    Report Status 02/17/2024 FINAL  Final  Blood Culture (routine x 2)     Status: None   Collection Time: 02/12/24  1:32 PM   Specimen: BLOOD LEFT FOREARM  Result Value Ref Range Status   Specimen Description BLOOD LEFT FOREARM  Final   Special Requests   Final    BOTTLES DRAWN AEROBIC AND ANAEROBIC Blood Culture adequate volume   Culture   Final    NO GROWTH 5 DAYS Performed at [facility], [address], [place], [place] [address]    Report Status 02/17/2024 FINAL  Final  Resp panel by RT-PCR (RSV, Flu A&B, Covid) Anterior Nasal Swab      Status: None   Collection Time: 02/12/24  1:39 PM   Specimen: Anterior Nasal Swab  Result Value Ref Range Status   SARS Coronavirus 2 by RT PCR NEGATIVE NEGATIVE Final   Influenza A by PCR NEGATIVE NEGATIVE Final   Influenza B by PCR NEGATIVE NEGATIVE Final    Comment: (NOTE) The Xpert Xpress SARS-CoV-2/FLU/RSV plus assay is intended as an aid in the diagnosis of influenza from Nasopharyngeal swab specimens and should not be used as a sole basis for treatment. Nasal washings and aspirates are unacceptable for Xpert Xpress SARS-CoV-2/FLU/RSV testing.  Fact Sheet for Patients: BloggerCourse.com  Fact Sheet for Healthcare Providers: SeriousBroker.it  This test is not yet approved or cleared by the [place]  FDA and has been authorized for detection and/or diagnosis of SARS-CoV-2 by FDA under an Emergency Use Authorization (EUA). This EUA will remain in effect (meaning this test can be used) for the duration of the COVID-19 declaration under Section 564(b)(1) of the Act, 21 U.S.C. section 360bbb-3(b)(1), unless the authorization is terminated or revoked.     Resp Syncytial  Virus by PCR NEGATIVE NEGATIVE Final    Comment: (NOTE) Fact Sheet for Patients: BloggerCourse.com  Fact Sheet for Healthcare Providers: SeriousBroker.it  This test is not yet approved or cleared by the [place]  FDA and has been authorized for detection and/or diagnosis of SARS-CoV-2 by FDA under an Emergency Use Authorization (EUA). This EUA will remain in effect (meaning this test can be used) for the duration of the COVID-19 declaration under Section 564(b)(1) of the Act, 21 U.S.C. section 360bbb-3(b)(1), unless the authorization is terminated or revoked.  Performed at [facility] Lab, [address], [place], [place] [address]   Urine Culture     Status: Abnormal   Collection Time: 02/12/24   1:50 PM   Specimen: Urine, Random  Result Value Ref Range Status   Specimen Description URINE, RANDOM  Final   Special Requests   Final    NONE Reflexed from [id number] Performed at [facility] Lab, [address] [place], [place] [address]    Culture 70,000 COLONIES/mL KLEBSIELLA PNEUMONIAE (A)  Final   Report Status 02/15/2024 FINAL  Final   Organism ID, Bacteria KLEBSIELLA PNEUMONIAE (A)  Final      Susceptibility   Klebsiella pneumoniae - MIC*    AMPICILLIN >=32 RESISTANT Resistant     CEFAZOLIN  <=4 SENSITIVE Sensitive     CEFEPIME <=0.12 SENSITIVE Sensitive     CEFTRIAXONE  <=0.25 SENSITIVE Sensitive     CIPROFLOXACIN  <=0.25 SENSITIVE Sensitive     GENTAMICIN <=1 SENSITIVE Sensitive     IMIPENEM <=0.25 SENSITIVE Sensitive     NITROFURANTOIN <=16 SENSITIVE Sensitive     TRIMETH /SULFA  <=20 SENSITIVE Sensitive     AMPICILLIN/SULBACTAM 4 SENSITIVE Sensitive     PIP/TAZO <=4 SENSITIVE Sensitive ug/mL    * 70,000 COLONIES/mL KLEBSIELLA PNEUMONIAE      Radiology Studies: No results found.      LOS: 6 days   Zohar Maroney [employer] on [email or web address]  02/18/2024, 9:15 AM

## 2024-02-18 NOTE — Plan of Care (Signed)
   Problem: Education: Goal: Ability to describe self-care measures that may prevent or decrease complications (Diabetes Survival Skills Education) will improve Outcome: Progressing   Problem: Coping: Goal: Ability to adjust to condition or change in health will improve Outcome: Progressing   Problem: Fluid Volume: Goal: Ability to maintain a balanced intake and output will improve Outcome: Progressing

## 2024-02-18 NOTE — Discharge Summary (Signed)
 [facility]  Physician Discharge Summary   Patient ID: [name] MRN: [id number] DOB/AGE: 11/06/1940 83 y.o.  Admit date: 02/12/2024 Discharge date:   02/18/2024   PCP: Ransom Byers, MD  DISCHARGE DIAGNOSES:    Essential hypertension   DM2 (diabetes mellitus, type 2) (HCC)   Carotid artery disease (HCC)   PAD (peripheral artery disease) (HCC)   AKI (acute kidney injury) (HCC)   Coronary artery disease involving native heart without angina pectoris   Gallbladder cancer (HCC)   Pneumonia   Transaminitis Paroxysmal Atrial fibrillation with RVR (HCC) Aspiration pneumonia  RECOMMENDATIONS FOR OUTPATIENT FOLLOW UP: Please check CBC and complete metabolic panel in 3 to 4 days Patient will need follow-up with her cardiologist Dr. Katheryne Pane in 2 to 3 weeks. Patient will need follow-up with her oncologist in 2 to 3 weeks. TSH and free T4 in 3 to 4 weeks   Home Health: Going to SNF Equipment/Devices: None  CODE STATUS: Full code  DISCHARGE CONDITION: fair  Diet recommendation: Regular as tolerated  INITIAL HISTORY: 83 y.o. female with a past medical history of essential hypertension, diabetes mellitus type 2, coronary artery disease, peripheral artery disease status post multiple interventions previously, status post CABG previously who is under the care of cancer Center for history of gallbladder cancer. She has completed radiation treatment. She has also continue treatment with the capecitabine . She continues to get treatment with durvalumab . She completed radiation treatment in April. Her last infusion with the durvalumab  was on April 23. Patient missed her appointments at the cancer center. When daughter went to check on her she was lying on the bed and was noted to be confused. She was brought into the hospital. She was noted to be febrile. Noted to have pneumonia. She was hospitalized for further management.   HOSPITAL COURSE:   Pneumonia most likely  aspiration pneumonia with severe sepsis, present on admission Patient was febrile in the ED with elevated WBC.  She also had lactic acidosis with initial level of 7.8.  Improvement noted. COVID-19 influenza and RSV PCR negative.  CT suggested right lung pneumonia.  Patient likely aspirated as she mentioned episodes of nausea and vomiting few days prior to admission. Patient was quite tachycardic at admission.  She met criteria for severe sepsis due to AKI. Patient has clinically improved.  WBC is better. Antibiotics were changed over to ceftriaxone  and doxycycline .  Blood cultures negative so far.  Surprisingly urine culture grew 70,000 colonies of Klebsiella.  However she never had any symptoms suggesting UTI.  Ceftriaxone  should cover this organism. She is noted to have intolerance to Augmentin.  But hesitate using fluoroquinolones.  She is also intolerant to Bactrim .  Will leave her on ceftriaxone  for now.  She will complete 5-day course today. Seen by PT and OT.  Initially home health was being considered however now recommendation has been changed to skilled nursing facility for short-term rehab.  Patient lives by herself so this is a reasonable option for her.   Acute kidney injury with metabolic acidosis/hypokalemia This is in the setting of nausea vomiting.  Noted to be on diuretic and ARB at home.  Also was on metformin  at home.   Patient was hydrated.  Renal function is back to baseline now.  IV fluids discontinued.   Paroxysmal atrial fibrillation with RVR No previous history of atrial fibrillation.  Likely triggered by sepsis.   TSH noted to be 0.112.  Free T4 is 1.16.  No change in treatment plan.  Will recommend thyroid  function test be rechecked in a few weeks. Echocardiogram shows normal systolic function with EF of 55 to 60%. CHADS2 vascular score is 6.  Anticoagulation discussed.  Patient was on low-dose Xarelto  previously.  Agreeable to therapeutic dose Xarelto .  This has been  initiated. Aspirin  can be discontinued. Continue metoprolol .  Remains in sinus rhythm. Outpatient follow-up with cardiology.  She is followed by Dr. Katheryne Pane.   Hematochezia Some blood was noted on the toilet paper after she had a bowel movement on 5/25.Aaron Aas  Rectal exam does not show any overt bleeding.  External hemorrhoids noted.   Small amount of bleeding was in the setting of constipation and straining.  Hemoglobin is stable.  No recurrence.  Continue with rivaroxaban .     Transaminitis Worsening LFTs most likely due to severe sepsis.  Bilirubin and alkaline phosphatase levels were normal.   LFTs continue to improve and anticipate they will return to baseline in a few days.   Statin is currently on hold. Recently checked hepatitis panel in 2024 was unremarkable. LFTs have improved.  No need to check them on a daily basis going forward.   Diabetes mellitus type 2 Noted to be on metformin  Jardiance  and Prandin  prior to admission.  No longer takes NovoLog .   HbA1c 4.9.   Thrombocytopenia Most likely secondary to sepsis.  Previous labs reviewed.  She has had mild thrombocytopenia in the past as well.  Anticipate that her platelet counts will improve eventually.  Check labs in a few days.   Gallbladder cancer Followed by medical oncology.  Has completed radiation treatment.  Remains on immune treatment.  Holding these medications for now.   She does have a biliary stent.  Bilirubin is normal.  Elevated AST ALT probably due to sepsis and noted to be improving.   Coronary artery disease status post CABG/hypertension Cardiac status is stable.  Continue to monitor. Orthostatic hypotension noted when she worked with physical therapy.  Likely due to deconditioning as well.  At this time she is only on metoprolol .  Her HCTZ and ARB are on hold.  Continue to hold them at discharge and they can be resumed once patient has improved from her deconditioning.   Peripheral artery disease She has had  angioplasties to lower extremity blood vessels previously.  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  Currently not an active issue.    Constipation CT of the abdomen pelvis suggest significant constipation.  TSH noted to be low. Has had multiple bowel movement with bowel regimen.  Continue with laxatives.   Stage II decubitus Pressure Injury 02/13/24 Coccyx Bilateral;Mid Stage 2 -  Partial thickness loss of dermis presenting as a shallow open injury with a red, pink wound bed without slough. (Active)  02/13/24 1052  Location: Coccyx  Location Orientation: Bilateral;Mid  Staging: Stage 2 -  Partial thickness loss of dermis presenting as a shallow open injury with a red, pink wound bed without slough.  Wound Description (Comments):   Present on Admission: Yes     Pressure Injury 02/13/24 Buttocks Right (Active)  02/13/24 1055  Location: Buttocks  Location Orientation: Right  Staging:   Wound Description (Comments):   Present on Admission:    Patient is stable.  Okay for discharge to SNF later today.    PERTINENT LABS:  The results of significant diagnostics from this hospitalization (including imaging, microbiology, ancillary and laboratory) are listed below for reference.    Microbiology: Recent Results (from the past 240 hours)  Blood Culture (routine x 2)     Status: None   Collection Time: 02/12/24  1:25 PM   Specimen: BLOOD RIGHT FOREARM  Result Value Ref Range Status   Specimen Description BLOOD RIGHT FOREARM  Final   Special Requests   Final    BOTTLES DRAWN AEROBIC AND ANAEROBIC Blood Culture adequate volume   Culture   Final    NO GROWTH 5 DAYS Performed at [facility], [address], [place], [place] [address]    Report Status 02/17/2024 FINAL  Final  Blood Culture (routine x 2)     Status: None   Collection Time: 02/12/24  1:32 PM   Specimen: BLOOD LEFT FOREARM  Result Value Ref Range Status   Specimen Description BLOOD LEFT FOREARM  Final    Special Requests   Final    BOTTLES DRAWN AEROBIC AND ANAEROBIC Blood Culture adequate volume   Culture   Final    NO GROWTH 5 DAYS Performed at [facility], [address] [place], [place] [address]    Report Status 02/17/2024 FINAL  Final  Resp panel by RT-PCR (RSV, Flu A&B, Covid) Anterior Nasal Swab     Status: None   Collection Time: 02/12/24  1:39 PM   Specimen: Anterior Nasal Swab  Result Value Ref Range Status   SARS Coronavirus 2 by RT PCR NEGATIVE NEGATIVE Final   Influenza A by PCR NEGATIVE NEGATIVE Final   Influenza B by PCR NEGATIVE NEGATIVE Final    Comment: (NOTE) The Xpert Xpress SARS-CoV-2/FLU/RSV plus assay is intended as an aid in the diagnosis of influenza from Nasopharyngeal swab specimens and should not be used as a sole basis for treatment. Nasal washings and aspirates are unacceptable for Xpert Xpress SARS-CoV-2/FLU/RSV testing.  Fact Sheet for Patients: BloggerCourse.com  Fact Sheet for Healthcare Providers: SeriousBroker.it  This test is not yet approved or cleared by the [place]  FDA and has been authorized for detection and/or diagnosis of SARS-CoV-2 by FDA under an Emergency Use Authorization (EUA). This EUA will remain in effect (meaning this test can be used) for the duration of the COVID-19 declaration under Section 564(b)(1) of the Act, 21 U.S.C. section 360bbb-3(b)(1), unless the authorization is terminated or revoked.     Resp Syncytial Virus by PCR NEGATIVE NEGATIVE Final    Comment: (NOTE) Fact Sheet for Patients: BloggerCourse.com  Fact Sheet for Healthcare Providers: SeriousBroker.it  This test is not yet approved or cleared by the [place]  FDA and has been authorized for detection and/or diagnosis of SARS-CoV-2 by FDA under an Emergency Use Authorization (EUA). This EUA will remain in effect (meaning this test can be  used) for the duration of the COVID-19 declaration under Section 564(b)(1) of the Act, 21 U.S.C. section 360bbb-3(b)(1), unless the authorization is terminated or revoked.  Performed at [facility], [address] [place], [place] [address]   Urine Culture     Status: Abnormal   Collection Time: 02/12/24  1:50 PM   Specimen: Urine, Random  Result Value Ref Range Status   Specimen Description URINE, RANDOM  Final   Special Requests   Final    NONE Reflexed from [id number] Performed at [facility], [address] [place], [place] [address]    Culture 70,000 COLONIES/mL KLEBSIELLA PNEUMONIAE (A)  Final   Report Status 02/15/2024 FINAL  Final   Organism ID, Bacteria KLEBSIELLA PNEUMONIAE (A)  Final      Susceptibility   Klebsiella pneumoniae -  MIC*    AMPICILLIN >=32 RESISTANT Resistant     CEFAZOLIN  <=4 SENSITIVE Sensitive     CEFEPIME <=0.12 SENSITIVE Sensitive     CEFTRIAXONE  <=0.25 SENSITIVE Sensitive     CIPROFLOXACIN  <=0.25 SENSITIVE Sensitive     GENTAMICIN <=1 SENSITIVE Sensitive     IMIPENEM <=0.25 SENSITIVE Sensitive     NITROFURANTOIN <=16 SENSITIVE Sensitive     TRIMETH /SULFA  <=20 SENSITIVE Sensitive     AMPICILLIN/SULBACTAM 4 SENSITIVE Sensitive     PIP/TAZO <=4 SENSITIVE Sensitive ug/mL    * 70,000 COLONIES/mL KLEBSIELLA PNEUMONIAE     Labs:   Basic Metabolic Panel: Recent Labs  Lab 02/14/24 0901 02/15/24 1400 02/16/24 0947 02/17/24 0500 02/18/24 0200  NA 140 140 141 139 138  K 3.9 3.9 3.8 4.0 4.2  CL 111 108 111 111 109  CO2 22 22 22 23 23   GLUCOSE 78 182* 226* 172* 169*  BUN 46* 35* 28* 27* 25*  CREATININE 1.08* 0.91 0.91 0.90 0.92  CALCIUM  9.2 9.4 9.5 9.2 9.1   Liver Function Tests: Recent Labs  Lab 02/14/24 0901 02/15/24 1400 02/16/24 0947 02/17/24 0500 02/18/24 0200  AST 1,133* 755* 375* 170* 92*  ALT 674* 740* 562* 374* 263*  ALKPHOS 107 190* 174* 178* 158*  BILITOT 0.7 0.8 0.8 0.6 0.4  PROT 4.6* 4.8* 4.8* 4.4* 4.2*   ALBUMIN  1.8* 2.0* 2.0* 1.9* 1.8*    CBC: Recent Labs  Lab 02/12/24 1330 02/13/24 0608 02/14/24 0901 02/15/24 1400 02/16/24 0947 02/17/24 0500 02/18/24 0200  WBC 14.0*   < > 13.4* 10.4 9.5 8.1 7.7  NEUTROABS 12.9*  --   --   --   --   --   --   HGB 12.0   < > 11.8* 12.3 12.4 11.6* 11.2*  HCT 35.8*   < > 34.3* 36.8 37.6 35.9* 33.6*  MCV 105.0*   < > 101.5* 102.8* 102.7* 103.2* 104.0*  PLT 224   < > 119* 105* 121* 100* 99*   < > = values in this interval not displayed.     CBG: Recent Labs  Lab 02/17/24 0803 02/17/24 1151 02/17/24 1707 02/17/24 2217 02/18/24 0743  GLUCAP 160* 230* 218* 222* 154*     IMAGING STUDIES US  ABDOMEN LIMITED WITH LIVER DOPPLER Result Date: 02/14/2024 CLINICAL DATA:  Transaminitis EXAM: DUPLEX ULTRASOUND OF LIVER TECHNIQUE: Color and duplex Doppler ultrasound was performed to evaluate the hepatic in-flow and out-flow vessels. COMPARISON:  02/12/2024 FINDINGS: Liver: Heterogeneous increased liver echotexture. 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 Normal hepatic contour without nodularity. No intrahepatic biliary duct dilation. Main Portal Vein size: 1.0 cm Portal Vein Velocities Main Prox:  24.2 cm/sec Main Mid: 22.7 cm/sec Main Dist:  27.3 cm/sec Right: 16.2 cm/sec Left: 9.8 cm/sec Hepatic Vein Velocities Right:  28.6 cm/sec Middle:  47.2 cm/sec Left:  35.0 cm/sec IVC: Present and patent with normal respiratory phasicity. Hepatic Artery Velocity:  41.3 cm/sec Splenic Vein Velocity:  22.3 cm/sec Spleen: 10.3 cm x 4.6 cm x 3.7 cm with a total volume of 92.6 cm^3 (411 cm^3 is upper limit normal) Portal Vein Occlusion/Thrombus: No Splenic Vein Occlusion/Thrombus: No Ascites: Present, trace Varices: None IMPRESSION: 1. Heterogeneous increased liver echotexture. 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 2. Normal hepatic Doppler. 3. Trace ascites. Electronically Signed   By:  Bobbye Burrow M.D.   On: 02/14/2024 16:02   CT CHEST WO CONTRAST Result Date:  02/13/2024 CLINICAL DATA:  Known right basilar infiltrate EXAM: CT CHEST WITHOUT CONTRAST TECHNIQUE: Multidetector CT imaging of the chest was performed following the standard protocol without IV contrast. RADIATION DOSE REDUCTION: This exam was performed according to the departmental dose-optimization program which includes automated exposure control, adjustment of the mA and/or kV according to patient size and/or use of iterative reconstruction technique. COMPARISON:  CT from the previous day, 09/27/2023 FINDINGS: Cardiovascular: Somewhat limited due to lack of IV contrast. Atherosclerotic calcifications of the aorta are noted without aneurysmal dilatation. Coronary bypass grafting is seen as well as heavy coronary calcifications. Left chest wall port is noted in satisfactory position. Mediastinum/Nodes: Thoracic inlet is within normal limits. No sizable hilar or mediastinal adenopathy is noted. A few small likely reactive nodes are seen. Lungs/Pleura: Lungs are well aerated bilaterally. Mild infiltrate is noted in the medial aspect of the left lower lobe. More marked infiltrate is noted in the right lower lobe and right middle lobe similar to that seen on the prior exam. A few small nodular changes are noted within the right upper lobe which are stable in appearance from January of 2025 as well as October of 2024. No new significant nodules are noted. Upper Abdomen: Visualized upper abdomen shows a biliary stent and mild pneumobilia. Mild ascites surrounding the liver is seen. This is new from the prior exam. Musculoskeletal: No chest wall mass or suspicious bone lesions identified. IMPRESSION: Bibasilar infiltrates right greater than left described. Multiple pulmonary nodules are noted in the right upper lobe stable from the prior studies. Mild abdominal ascites surrounding liver. Electronically Signed   By: Violeta Grey M.D.   On:  02/13/2024 21:33   ECHOCARDIOGRAM COMPLETE Result Date: 02/13/2024    ECHOCARDIOGRAM REPORT   Patient Name:   GARGI BERCH Date of Exam: 02/13/2024 Medical Rec #:  [id number]     Height:       65.0 in Accession #:    [id number]    Weight:       114.0 lb Date of Birth:  August 13, 1941     BSA:          1.558 m Patient Age:    82 years      BP:           139/68 mmHg Patient Gender: F             HR:           76 bpm. Exam Location:  Inpatient Procedure: 2D Echo, Color Doppler and Cardiac Doppler (Both Spectral and Color            Flow Doppler were utilized during procedure). Indications:    Atrial Fibrillation  History:        Patient has prior history of Echocardiogram examinations, most                 recent 03/24/2020. CAD, Prior CABG, Arrythmias:Atrial                 Fibrillation; Risk Factors:Hypertension, Diabetes and                 Dyslipidemia.  Sonographer:    Kip Peon RDCS Referring Phys: [id number]orale IMPRESSIONS  1. Left ventricular ejection fraction, by estimation, is 55 to 60%. The left ventricle has normal function. The left ventricle has no regional wall motion abnormalities. Left ventricular diastolic parameters were normal.  2. Right ventricular systolic function is mildly reduced. The right ventricular size is mildly  enlarged.  3. The mitral valve is grossly normal. Trivial mitral valve regurgitation. No evidence of mitral stenosis.  4. The aortic valve is normal in structure. There is moderate calcification of the aortic valve. There is moderate thickening of the aortic valve. Aortic valve regurgitation is not visualized. No aortic stenosis is present.  5. The inferior vena cava is normal in size with greater than 50% respiratory variability, suggesting right atrial pressure of 3 mmHg. FINDINGS  Left Ventricle: Left ventricular ejection fraction, by estimation, is 55 to 60%. The left ventricle has normal function. The left ventricle has no regional wall motion abnormalities. The left  ventricular internal cavity size was normal in size. There is  no left ventricular hypertrophy. Left ventricular diastolic parameters were normal. Right Ventricle: The right ventricular size is mildly enlarged. No increase in right ventricular wall thickness. Right ventricular systolic function is mildly reduced. Left Atrium: Left atrial size was normal in size. Right Atrium: Right atrial size was normal in size. Pericardium: There is no evidence of pericardial effusion. Mitral Valve: The mitral valve is grossly normal. There is mild thickening of the mitral valve leaflet(s). Mild mitral annular calcification. Trivial mitral valve regurgitation. No evidence of mitral valve stenosis. Tricuspid Valve: The tricuspid valve is normal in structure. Tricuspid valve regurgitation is not demonstrated. No evidence of tricuspid stenosis. Aortic Valve: The aortic valve is normal in structure. There is moderate calcification of the aortic valve. There is moderate thickening of the aortic valve. There is moderate aortic valve annular calcification. Aortic valve regurgitation is not visualized. No aortic stenosis is present. Pulmonic Valve: The pulmonic valve was normal in structure. Pulmonic valve regurgitation is not visualized. No evidence of pulmonic stenosis. Aorta: The aortic root is normal in size and structure. Venous: The inferior vena cava is normal in size with greater than 50% respiratory variability, suggesting right atrial pressure of 3 mmHg. IAS/Shunts: No atrial level shunt detected by color flow Doppler.  LEFT VENTRICLE PLAX 2D LVIDd:         4.40 cm   Diastology LVIDs:         3.20 cm   LV e' medial:    6.08 cm/s LV PW:         1.10 cm   LV E/e' medial:  18.4 LV IVS:        0.90 cm   LV e' lateral:   10.80 cm/s LVOT diam:     1.90 cm   LV E/e' lateral: 10.4 LV SV:         54 LV SV Index:   35 LVOT Area:     2.84 cm  RIGHT VENTRICLE            IVC RV S prime:     8.11 cm/s  IVC diam: 1.80 cm TAPSE (M-mode): 1.6  cm LEFT ATRIUM             Index LA diam:        4.00 cm 2.57 cm/m LA Vol (A2C):   35.8 ml 22.98 ml/m LA Vol (A4C):   31.9 ml 20.48 ml/m LA Biplane Vol: 36.9 ml 23.69 ml/m  AORTIC VALVE LVOT Vmax:   76.00 cm/s LVOT Vmean:  54.000 cm/s LVOT VTI:    0.191 m  AORTA Ao Root diam: 2.80 cm Ao Asc diam:  2.70 cm MITRAL VALVE MV Area (PHT): 4.06 cm     SHUNTS MV Decel Time: 187 msec     Systemic VTI:  0.19 m  MV E velocity: 112.00 cm/s  Systemic Diam: 1.90 cm MV A velocity: 110.00 cm/s MV E/A ratio:  1.02 Aditya Sabharwal Electronically signed by Alwin Baars Signature Date/Time: 02/13/2024/6:46:39 PM    Final    DG Chest Port 1 View Result Date: 02/12/2024 CLINICAL DATA:  Questionable sepsis. EXAM: PORTABLE CHEST 1 VIEW COMPARISON:  Chest radiograph dated 07/12/2023 FINDINGS: Similar positioning of the left Port-A-Cath. There is diffuse interstitial coarsening. Faint bibasilar densities, likely atelectasis. Developing infiltrate in the right infrahilar region is not excluded. No pleural effusion or pneumothorax the cardiac silhouette. Median sternotomy wires and CABG vascular clips. No acute osseous pathology. IMPRESSION: Bibasilar atelectasis. Developing infiltrate in the right infrahilar region is not excluded. Electronically Signed   By: Angus Bark M.D.   On: 02/12/2024 18:31   CT ABDOMEN PELVIS WO CONTRAST Result Date: 02/12/2024 CLINICAL DATA:  Sepsis EXAM: CT ABDOMEN AND PELVIS WITHOUT CONTRAST TECHNIQUE: Multidetector CT imaging of the abdomen and pelvis was performed following the standard protocol without IV contrast. RADIATION DOSE REDUCTION: This exam was performed according to the departmental dose-optimization program which includes automated exposure control, adjustment of the mA and/or kV according to patient size and/or use of iterative reconstruction technique. COMPARISON:  CT of the abdomen pelvis performed Jan 30, 2024 FINDINGS: Lower chest: Interval development of patchy multifocal  airspace consolidation in the right middle lobe and right lower lobes. Hepatobiliary: A biliary stent is present. Small volume perihepatic ascites. Limited visualization of liver metastasis secondary to the absence of intravenous contrast agent. Pancreas: Nothing significant. Spleen: Nothing significant. Adrenals/Urinary Tract: No hydronephrosis. Vascular calcifications in both kidneys. The urinary bladder contains a small focus of air. Stomach/Bowel: The stomach is nondilated. A moderate volume of stool material is present throughout the colon. Vascular/Lymphatic: Extensive calcific atherosclerosis is present. No abdominal aortic aneurysm. Reproductive: Uterus and bilateral adnexa are unremarkable. Other: Nothing significant. Musculoskeletal: Not significantly changed. IMPRESSION: 1. Interval development of multifocal airspace infiltrates in the right middle lobe and right lower lobe, compatible with pneumonia. 2. A moderate to large volume of stool material is present throughout the colon which may represent sequelae of constipation. 3. Similar appearance of internal biliary stent. Electronically Signed   By: Reagan Camera M.D.   On: 02/12/2024 16:27   CT ABDOMEN PELVIS W CONTRAST Result Date: 01/30/2024 EXAMINATION: CT ABDOMEN PELVIS W CONTRAST CLINICAL INDICATION: Female, 83 years old. Gallbladder/biliary cancer, assess treatment response TECHNIQUE: Axial CT of the abdomen and pelvis with 100 cc Omnipaque  300 intravenous contrast. Multiplanar reformations provided. Unless otherwise specified, incidental thyroid , adrenal, renal lesions do not require dedicated imaging follow up. Additionally, any mentioned pulmonary nodules do not require dedicated imaging follow-up based on the Fleischner guidelines unless otherwise specified. Coronary calcifications are not identified unless otherwise specified. COMPARISON: 09/27/2023 FINDINGS: The lung bases are clear. The heart is normal in size. There are coronary  calcifications. There is a similar 2.3 cm metastasis within hepatic segment 4B/5. The gallbladder is surgically absent. There is a patent common bile duct stent. The spleen is normal. The pancreas appears normal. The adrenals are normal. Kidneys are normal. The abdominal aorta is normal in caliber. Scattered atherosclerotic changes are present. The urinary bladder is normal. The uterus is normal. Large and small bowel loops are otherwise within normal limits. There is no free fluid or pathologic lymphadenopathy by size criteria. There is diffuse osseous mineralization and degenerative changes of the spine and bony pelvis. No concerning osseous lesions. IMPRESSION: Similar hepatic metastasis. Patent common bile duct  stent. DOSE REDUCTION: This exam was performed according to our departmental dose-optimization program which includes automated exposure control, adjustment of the mA and/or kV according to patient size and/or use of iterative reconstruction technique. Electronically signed by: [place] Engel MD 01/30/2024 01:09 PM EDT RP Workstation: [id number]    DISCHARGE EXAMINATION: See progress note from earlier today  DISPOSITION: SNF  Discharge Instructions     (HEART FAILURE PATIENTS) Call MD:  Anytime you have any of the following symptoms: 1) 3 pound weight gain in 24 hours or 5 pounds in 1 week 2) shortness of breath, with or without a dry hacking cough 3) swelling in the hands, feet or stomach 4) if you have to sleep on extra pillows at night in order to breathe.   Complete by: As directed    Call MD for:  difficulty breathing, headache or visual disturbances   Complete by: As directed    Call MD for:  persistant dizziness or light-headedness   Complete by: As directed    Call MD for:  persistant nausea and vomiting   Complete by: As directed    Call MD for:  severe uncontrolled pain   Complete by: As directed    Call MD for:  temperature >100.4   Complete by: As directed    Diet - low  sodium heart healthy   Complete by: As directed    Discharge instructions   Complete by: As directed    Please review instruction son the DC summary.  You were cared for by a hospitalist during your hospital stay. If you have any questions about your discharge medications or the care you received while you were in the hospital after you are discharged, you can call the unit and asked to speak with the hospitalist on call if the hospitalist that took care of you is not available. Once you are discharged, your primary care physician will handle any further medical issues. Please note that NO REFILLS for any discharge medications will be authorized once you are discharged, as it is imperative that you return to your primary care physician (or establish a relationship with a primary care physician if you do not have one) for your aftercare needs so that they can reassess your need for medications and monitor your lab values. If you do not have a primary care physician, you can call [phone] for a physician referral.   Discharge wound care:   Complete by: As directed    Comments: Sacrum/buttock - Apply Xeroform on the wound bed (change daily), cover with foam dressing, if the foam is in a good condition, can be change every 3 days or PRN.   Increase activity slowly   Complete by: As directed          Allergies as of 02/18/2024       Reactions   Versed  [midazolam ] Anxiety   Frantic, out of my mind, agitated    Augmentin [amoxicillin-pot Clavulanate] Other (See Comments)   Elevates potassium level   Bactrim  [sulfamethoxazole -trimethoprim ] Other (See Comments)   ^ K+( elevated)    Demerol [meperidine]    Delusional    Meperidine Hcl    Other reaction(s): delusional   Scopolamine    Delusional  Patch *   Tramadol  Other (See Comments)   Keeps awake        Medication List     PAUSE taking these medications    capecitabine  500 MG tablet Wait to take this until your doctor or other  care  provider tells you to start again. Commonly known as: XELODA  Take 2 tablets (1000 mg total) by mouth 2 (two) times daily. Take within 30 minutes after meals. Take only on days of radiation, Monday to Friday.       STOP taking these medications    aspirin  EC 81 MG tablet   hydrochlorothiazide  12.5 MG tablet Commonly known as: HYDRODIURIL    prochlorperazine  10 MG tablet Commonly known as: COMPAZINE    telmisartan  80 MG tablet Commonly known as: MICARDIS        TAKE these medications    brimonidine  0.2 % ophthalmic solution Commonly known as: ALPHAGAN  Place 1 drop into the right eye 2 (two) times daily.   empagliflozin  25 MG Tabs tablet Commonly known as: Jardiance  Take 1 tablet (25 mg total) by mouth daily before breakfast.   feeding supplement Liqd Take 237 mLs by mouth 2 (two) times daily between meals.   Insulin  Lispro Prot & Lispro (75-25) 100 UNIT/ML Kwikpen Commonly known as: HumaLOG  Mix 75/25 KwikPen INJECT 6 UNITS SUBCUTANEOUSLY ONCE DAILY WITH BREAKFAST   MAGnesium -Oxide 400 (240 Mg) MG tablet Generic drug: magnesium  oxide Take 1 tablet by mouth twice daily   metFORMIN  1000 MG tablet Commonly known as: GLUCOPHAGE  Take 1 tablet (1,000 mg total) by mouth 2 (two) times daily.   metoprolol  tartrate 25 MG tablet Commonly known as: LOPRESSOR  Take 1 tablet (25 mg total) by mouth 2 (two) times daily. What changed:  medication strength how much to take   mupirocin  ointment 2 % Commonly known as: BACTROBAN  Apply 1 Application topically 2 (two) times daily. What changed:  when to take this reasons to take this   OneTouch Delica Plus Lancet33G Misc Use to check blood sugars twice daily.   OneTouch Verio test strip Generic drug: glucose blood USE TO CHECK BLOOD SUGARS ONCE DAILY   polyethylene glycol 17 g packet Commonly known as: MIRALAX  / GLYCOLAX  Take 17 g by mouth daily. Start taking on: Feb 19, 2024   repaglinide  1 MG tablet Commonly known  as: PRANDIN  Take 1 tablet (1 mg total) by mouth 2 (two) times daily before a meal. Take 1 tab by breakfast and 1 tab before dinner   Rivaroxaban  15 MG Tabs tablet Commonly known as: XARELTO  Take 1 tablet (15 mg total) by mouth daily with supper. What changed:  medication strength how much to take when to take this   rosuvastatin  40 MG tablet Commonly known as: CRESTOR  Take 1 tablet (40 mg total) by mouth daily.   senna 8.6 MG Tabs tablet Commonly known as: SENOKOT Take 2 tablets (17.2 mg total) by mouth 2 (two) times daily.   Systane Balance 0.6 % Soln Generic drug: Propylene Glycol Place 1 drop into the left eye 3 (three) times daily as needed (dry/irritated eyes).   traZODone  50 MG tablet Commonly known as: DESYREL  Take 1 tablet (50 mg total) by mouth at bedtime as needed for sleep.   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  A cane will not resolve issue with performing activities of daily living. A wheelchair will allow patient to safely perform daily activities. Patient can safely propel the wheelchair in the home or has a caregiver who can provide assistance. Length of need lifetime . Accessories: elevating leg rests (ELRs), wheel locks, extensions and anti-tippers.  Seat and back cushions   Ht 5'5" and wt 51.7 kg   02/13/24 1642              Discharge Care Instructions  (From admission, onward)           Start     Ordered   02/18/24 0000  Discharge wound care:       Comments:  Comments: Sacrum/buttock - Apply Xeroform on the wound bed (change daily), cover with foam dressing, if the foam is in a good condition, can be change every 3 days or PRN.   02/18/24 1152              Follow-up Information     [facility] Follow up.   Specialty: Home Health Services Contact information: [address] [place] [address] [phone]         Sonja [name], MD Follow up.   Specialties: Hematology, Oncology Contact information: [address] [place] [address] [phone]                 TOTAL DISCHARGE TIME: 35 minutes  Dezirae Service Lyndon Santiago  [facility] Pager on [email or web address]  02/18/2024, 11:54 AM

## 2024-02-18 NOTE — Telephone Encounter (Signed)
 Pharmacy Patient Advocate Encounter  Insurance verification completed.    The patient is insured through [facility]. Patient has Medicare and is not eligible for a copay card, but may be able to apply for patient assistance or Medicare RX Payment Plan (Patient Must reach out to their plan, if eligible for payment plan), if available.    Ran test claim for Xarelto  and the current 30 day co-pay is $0.00, subject to change as current copay is listed as CATASTROPHIC copay.   This test claim was processed through [facility]- copay amounts may vary at other pharmacies due to pharmacy/plan contracts, or as the patient moves through the different stages of their insurance plan.

## 2024-02-18 NOTE — Progress Notes (Signed)
 Report called to [facility] Ms LPN at [facility].

## 2024-02-20 DIAGNOSIS — R7401 Elevation of levels of liver transaminase levels: Secondary | ICD-10-CM | POA: Diagnosis not present

## 2024-02-20 DIAGNOSIS — I480 Paroxysmal atrial fibrillation: Secondary | ICD-10-CM | POA: Diagnosis not present

## 2024-02-20 DIAGNOSIS — D6859 Other primary thrombophilia: Secondary | ICD-10-CM | POA: Diagnosis not present

## 2024-02-20 DIAGNOSIS — M6259 Muscle wasting and atrophy, not elsewhere classified, multiple sites: Secondary | ICD-10-CM | POA: Diagnosis not present

## 2024-02-20 DIAGNOSIS — J150 Pneumonia due to Klebsiella pneumoniae: Secondary | ICD-10-CM | POA: Diagnosis not present

## 2024-02-20 DIAGNOSIS — Z95828 Presence of other vascular implants and grafts: Secondary | ICD-10-CM | POA: Diagnosis not present

## 2024-02-20 DIAGNOSIS — E1165 Type 2 diabetes mellitus with hyperglycemia: Secondary | ICD-10-CM | POA: Diagnosis not present

## 2024-02-20 DIAGNOSIS — C23 Malignant neoplasm of gallbladder: Secondary | ICD-10-CM | POA: Diagnosis not present

## 2024-02-20 DIAGNOSIS — Z7901 Long term (current) use of anticoagulants: Secondary | ICD-10-CM | POA: Diagnosis not present

## 2024-02-20 DIAGNOSIS — K219 Gastro-esophageal reflux disease without esophagitis: Secondary | ICD-10-CM | POA: Diagnosis not present

## 2024-02-20 DIAGNOSIS — I2510 Atherosclerotic heart disease of native coronary artery without angina pectoris: Secondary | ICD-10-CM | POA: Diagnosis not present

## 2024-02-20 DIAGNOSIS — Z9189 Other specified personal risk factors, not elsewhere classified: Secondary | ICD-10-CM | POA: Diagnosis not present

## 2024-02-24 DIAGNOSIS — M6259 Muscle wasting and atrophy, not elsewhere classified, multiple sites: Secondary | ICD-10-CM | POA: Diagnosis not present

## 2024-02-24 DIAGNOSIS — R2681 Unsteadiness on feet: Secondary | ICD-10-CM | POA: Diagnosis not present

## 2024-02-24 DIAGNOSIS — J189 Pneumonia, unspecified organism: Secondary | ICD-10-CM | POA: Diagnosis not present

## 2024-02-24 DIAGNOSIS — M6281 Muscle weakness (generalized): Secondary | ICD-10-CM | POA: Diagnosis not present

## 2024-02-25 ENCOUNTER — Ambulatory Visit: Admitting: Podiatry

## 2024-02-25 DIAGNOSIS — L89150 Pressure ulcer of sacral region, unstageable: Secondary | ICD-10-CM | POA: Diagnosis not present

## 2024-02-28 ENCOUNTER — Other Ambulatory Visit: Payer: Self-pay

## 2024-03-02 DIAGNOSIS — K5909 Other constipation: Secondary | ICD-10-CM | POA: Diagnosis not present

## 2024-03-02 DIAGNOSIS — E1122 Type 2 diabetes mellitus with diabetic chronic kidney disease: Secondary | ICD-10-CM | POA: Diagnosis not present

## 2024-03-02 DIAGNOSIS — Z7984 Long term (current) use of oral hypoglycemic drugs: Secondary | ICD-10-CM | POA: Diagnosis not present

## 2024-03-02 DIAGNOSIS — Z7189 Other specified counseling: Secondary | ICD-10-CM | POA: Diagnosis not present

## 2024-03-02 DIAGNOSIS — N189 Chronic kidney disease, unspecified: Secondary | ICD-10-CM | POA: Diagnosis not present

## 2024-03-02 DIAGNOSIS — Z794 Long term (current) use of insulin: Secondary | ICD-10-CM | POA: Diagnosis not present

## 2024-03-02 DIAGNOSIS — K644 Residual hemorrhoidal skin tags: Secondary | ICD-10-CM | POA: Diagnosis not present

## 2024-03-02 DIAGNOSIS — L89156 Pressure-induced deep tissue damage of sacral region: Secondary | ICD-10-CM | POA: Diagnosis not present

## 2024-03-03 DIAGNOSIS — I10 Essential (primary) hypertension: Secondary | ICD-10-CM | POA: Diagnosis not present

## 2024-03-03 DIAGNOSIS — M545 Low back pain, unspecified: Secondary | ICD-10-CM | POA: Diagnosis not present

## 2024-03-03 DIAGNOSIS — Z87898 Personal history of other specified conditions: Secondary | ICD-10-CM | POA: Diagnosis not present

## 2024-03-03 DIAGNOSIS — L89156 Pressure-induced deep tissue damage of sacral region: Secondary | ICD-10-CM | POA: Diagnosis not present

## 2024-03-04 ENCOUNTER — Other Ambulatory Visit: Payer: Self-pay

## 2024-03-05 ENCOUNTER — Other Ambulatory Visit: Payer: Self-pay

## 2024-03-05 ENCOUNTER — Telehealth: Payer: Self-pay

## 2024-03-05 ENCOUNTER — Telehealth: Payer: Self-pay | Admitting: Hematology

## 2024-03-05 DIAGNOSIS — Z794 Long term (current) use of insulin: Secondary | ICD-10-CM | POA: Diagnosis not present

## 2024-03-05 DIAGNOSIS — E1122 Type 2 diabetes mellitus with diabetic chronic kidney disease: Secondary | ICD-10-CM | POA: Diagnosis not present

## 2024-03-05 DIAGNOSIS — R197 Diarrhea, unspecified: Secondary | ICD-10-CM | POA: Diagnosis not present

## 2024-03-05 DIAGNOSIS — Z7984 Long term (current) use of oral hypoglycemic drugs: Secondary | ICD-10-CM | POA: Diagnosis not present

## 2024-03-05 DIAGNOSIS — N183 Chronic kidney disease, stage 3 unspecified: Secondary | ICD-10-CM | POA: Diagnosis not present

## 2024-03-05 DIAGNOSIS — Z7901 Long term (current) use of anticoagulants: Secondary | ICD-10-CM | POA: Diagnosis not present

## 2024-03-05 DIAGNOSIS — L89156 Pressure-induced deep tissue damage of sacral region: Secondary | ICD-10-CM | POA: Diagnosis not present

## 2024-03-05 DIAGNOSIS — L89310 Pressure ulcer of right buttock, unstageable: Secondary | ICD-10-CM | POA: Diagnosis not present

## 2024-03-05 DIAGNOSIS — Z8719 Personal history of other diseases of the digestive system: Secondary | ICD-10-CM | POA: Diagnosis not present

## 2024-03-05 NOTE — Telephone Encounter (Signed)
 Pt called requesting to reschedule her appts on 03/11/2024 to until Dr. Maryalice Smaller returns to the office.  Pt stated she would like to speak directly to Dr. Maryalice Smaller regarding her tx and is not sure if she would like to continue doing tx.  Transferred pt to scheduling to have the appts rescheduled with Dr. Maryalice Smaller.

## 2024-03-09 DIAGNOSIS — M6281 Muscle weakness (generalized): Secondary | ICD-10-CM | POA: Diagnosis not present

## 2024-03-09 DIAGNOSIS — R2681 Unsteadiness on feet: Secondary | ICD-10-CM | POA: Diagnosis not present

## 2024-03-09 DIAGNOSIS — J189 Pneumonia, unspecified organism: Secondary | ICD-10-CM | POA: Diagnosis not present

## 2024-03-09 DIAGNOSIS — L89156 Pressure-induced deep tissue damage of sacral region: Secondary | ICD-10-CM | POA: Diagnosis not present

## 2024-03-09 DIAGNOSIS — M6259 Muscle wasting and atrophy, not elsewhere classified, multiple sites: Secondary | ICD-10-CM | POA: Diagnosis not present

## 2024-03-09 DIAGNOSIS — R52 Pain, unspecified: Secondary | ICD-10-CM | POA: Diagnosis not present

## 2024-03-10 DIAGNOSIS — L89150 Pressure ulcer of sacral region, unstageable: Secondary | ICD-10-CM | POA: Diagnosis not present

## 2024-03-10 DIAGNOSIS — L89310 Pressure ulcer of right buttock, unstageable: Secondary | ICD-10-CM | POA: Diagnosis not present

## 2024-03-11 ENCOUNTER — Ambulatory Visit

## 2024-03-11 ENCOUNTER — Encounter: Admitting: Dietician

## 2024-03-11 ENCOUNTER — Other Ambulatory Visit

## 2024-03-11 ENCOUNTER — Ambulatory Visit: Admitting: Nurse Practitioner

## 2024-03-11 DIAGNOSIS — R2681 Unsteadiness on feet: Secondary | ICD-10-CM | POA: Diagnosis not present

## 2024-03-11 DIAGNOSIS — R52 Pain, unspecified: Secondary | ICD-10-CM | POA: Diagnosis not present

## 2024-03-11 DIAGNOSIS — L89156 Pressure-induced deep tissue damage of sacral region: Secondary | ICD-10-CM | POA: Diagnosis not present

## 2024-03-11 DIAGNOSIS — J189 Pneumonia, unspecified organism: Secondary | ICD-10-CM | POA: Diagnosis not present

## 2024-03-11 DIAGNOSIS — M6259 Muscle wasting and atrophy, not elsewhere classified, multiple sites: Secondary | ICD-10-CM | POA: Diagnosis not present

## 2024-03-11 DIAGNOSIS — M6281 Muscle weakness (generalized): Secondary | ICD-10-CM | POA: Diagnosis not present

## 2024-03-12 ENCOUNTER — Other Ambulatory Visit (HOSPITAL_COMMUNITY): Payer: Self-pay | Admitting: Adult Health

## 2024-03-12 DIAGNOSIS — L8990 Pressure ulcer of unspecified site, unspecified stage: Secondary | ICD-10-CM

## 2024-03-17 DIAGNOSIS — Z794 Long term (current) use of insulin: Secondary | ICD-10-CM | POA: Diagnosis not present

## 2024-03-17 DIAGNOSIS — J189 Pneumonia, unspecified organism: Secondary | ICD-10-CM | POA: Diagnosis not present

## 2024-03-17 DIAGNOSIS — M6259 Muscle wasting and atrophy, not elsewhere classified, multiple sites: Secondary | ICD-10-CM | POA: Diagnosis not present

## 2024-03-17 DIAGNOSIS — E1122 Type 2 diabetes mellitus with diabetic chronic kidney disease: Secondary | ICD-10-CM | POA: Diagnosis not present

## 2024-03-17 DIAGNOSIS — L98429 Non-pressure chronic ulcer of back with unspecified severity: Secondary | ICD-10-CM | POA: Diagnosis not present

## 2024-03-17 DIAGNOSIS — L89310 Pressure ulcer of right buttock, unstageable: Secondary | ICD-10-CM | POA: Diagnosis not present

## 2024-03-17 DIAGNOSIS — M6281 Muscle weakness (generalized): Secondary | ICD-10-CM | POA: Diagnosis not present

## 2024-03-17 DIAGNOSIS — L89150 Pressure ulcer of sacral region, unstageable: Secondary | ICD-10-CM | POA: Diagnosis not present

## 2024-03-17 DIAGNOSIS — I10 Essential (primary) hypertension: Secondary | ICD-10-CM | POA: Diagnosis not present

## 2024-03-17 DIAGNOSIS — Z7901 Long term (current) use of anticoagulants: Secondary | ICD-10-CM | POA: Diagnosis not present

## 2024-03-17 DIAGNOSIS — Z7984 Long term (current) use of oral hypoglycemic drugs: Secondary | ICD-10-CM | POA: Diagnosis not present

## 2024-03-17 DIAGNOSIS — N189 Chronic kidney disease, unspecified: Secondary | ICD-10-CM | POA: Diagnosis not present

## 2024-03-17 DIAGNOSIS — I482 Chronic atrial fibrillation, unspecified: Secondary | ICD-10-CM | POA: Diagnosis not present

## 2024-03-17 DIAGNOSIS — R52 Pain, unspecified: Secondary | ICD-10-CM | POA: Diagnosis not present

## 2024-03-17 DIAGNOSIS — L89156 Pressure-induced deep tissue damage of sacral region: Secondary | ICD-10-CM | POA: Diagnosis not present

## 2024-03-17 DIAGNOSIS — R2681 Unsteadiness on feet: Secondary | ICD-10-CM | POA: Diagnosis not present

## 2024-03-19 ENCOUNTER — Ambulatory Visit (HOSPITAL_COMMUNITY)
Admission: RE | Admit: 2024-03-19 | Discharge: 2024-03-19 | Disposition: A | Discharge status: Home / Self Care | Source: Ambulatory Visit | Attending: Adult Health | Admitting: Adult Health

## 2024-03-19 DIAGNOSIS — M6259 Muscle wasting and atrophy, not elsewhere classified, multiple sites: Secondary | ICD-10-CM | POA: Diagnosis not present

## 2024-03-19 DIAGNOSIS — L89156 Pressure-induced deep tissue damage of sacral region: Secondary | ICD-10-CM | POA: Diagnosis not present

## 2024-03-19 DIAGNOSIS — R52 Pain, unspecified: Secondary | ICD-10-CM | POA: Diagnosis not present

## 2024-03-19 DIAGNOSIS — R2681 Unsteadiness on feet: Secondary | ICD-10-CM | POA: Diagnosis not present

## 2024-03-19 DIAGNOSIS — L8990 Pressure ulcer of unspecified site, unspecified stage: Secondary | ICD-10-CM | POA: Insufficient documentation

## 2024-03-19 DIAGNOSIS — J189 Pneumonia, unspecified organism: Secondary | ICD-10-CM | POA: Diagnosis not present

## 2024-03-19 DIAGNOSIS — M6281 Muscle weakness (generalized): Secondary | ICD-10-CM | POA: Diagnosis not present

## 2024-03-19 MED ORDER — GADOBUTROL 1 MMOL/ML IV SOLN
5.0000 mL | Freq: Once | INTRAVENOUS | Status: AC | PRN
  Administered 2024-03-19: 5 mL via INTRAVENOUS

## 2024-03-20 ENCOUNTER — Telehealth: Payer: Self-pay

## 2024-03-20 ENCOUNTER — Inpatient Hospital Stay: Attending: Radiation Oncology

## 2024-03-20 ENCOUNTER — Inpatient Hospital Stay

## 2024-03-20 ENCOUNTER — Inpatient Hospital Stay (HOSPITAL_BASED_OUTPATIENT_CLINIC_OR_DEPARTMENT_OTHER): Admitting: Hematology

## 2024-03-20 ENCOUNTER — Other Ambulatory Visit: Payer: Self-pay

## 2024-03-20 ENCOUNTER — Inpatient Hospital Stay: Admitting: Dietician

## 2024-03-20 VITALS — BP 118/42 | HR 103 | Temp 97.9°F | Resp 15 | Ht 65.0 in | Wt 116.6 lb

## 2024-03-20 DIAGNOSIS — E876 Hypokalemia: Secondary | ICD-10-CM

## 2024-03-20 DIAGNOSIS — I4891 Unspecified atrial fibrillation: Secondary | ICD-10-CM | POA: Diagnosis not present

## 2024-03-20 DIAGNOSIS — C23 Malignant neoplasm of gallbladder: Secondary | ICD-10-CM | POA: Insufficient documentation

## 2024-03-20 DIAGNOSIS — R197 Diarrhea, unspecified: Secondary | ICD-10-CM | POA: Diagnosis not present

## 2024-03-20 DIAGNOSIS — L89159 Pressure ulcer of sacral region, unspecified stage: Secondary | ICD-10-CM | POA: Insufficient documentation

## 2024-03-20 DIAGNOSIS — D649 Anemia, unspecified: Secondary | ICD-10-CM | POA: Diagnosis not present

## 2024-03-20 DIAGNOSIS — G479 Sleep disorder, unspecified: Secondary | ICD-10-CM | POA: Insufficient documentation

## 2024-03-20 DIAGNOSIS — C787 Secondary malignant neoplasm of liver and intrahepatic bile duct: Secondary | ICD-10-CM | POA: Diagnosis not present

## 2024-03-20 DIAGNOSIS — Z95828 Presence of other vascular implants and grafts: Secondary | ICD-10-CM

## 2024-03-20 DIAGNOSIS — R630 Anorexia: Secondary | ICD-10-CM | POA: Diagnosis not present

## 2024-03-20 LAB — CMP (CANCER CENTER ONLY)
ALT: 17 U/L (ref 0–44)
AST: 27 U/L (ref 15–41)
Albumin: 2.5 g/dL — ABNORMAL LOW (ref 3.5–5.0)
Alkaline Phosphatase: 207 U/L — ABNORMAL HIGH (ref 38–126)
Anion gap: 11 (ref 5–15)
BUN: 61 mg/dL — ABNORMAL HIGH (ref 8–23)
CO2: 30 mmol/L (ref 22–32)
Calcium: 9.5 mg/dL (ref 8.9–10.3)
Chloride: 99 mmol/L (ref 98–111)
Creatinine: 1.05 mg/dL — ABNORMAL HIGH (ref 0.44–1.00)
GFR, Estimated: 53 mL/min — ABNORMAL LOW (ref >60)
Glucose, Bld: 113 mg/dL — ABNORMAL HIGH (ref 70–99)
Potassium: 2.4 mmol/L — CL (ref 3.5–5.1)
Sodium: 140 mmol/L (ref 135–145)
Total Bilirubin: 0.5 mg/dL (ref 0.0–1.2)
Total Protein: 5.4 g/dL — ABNORMAL LOW (ref 6.5–8.1)

## 2024-03-20 LAB — CBC WITH DIFFERENTIAL (CANCER CENTER ONLY)
Abs Immature Granulocytes: 0.04 10*3/uL (ref 0.00–0.07)
Basophils Absolute: 0.1 10*3/uL (ref 0.0–0.1)
Basophils Relative: 0 %
Eosinophils Absolute: 0 10*3/uL (ref 0.0–0.5)
Eosinophils Relative: 0 %
HCT: 26.5 % — ABNORMAL LOW (ref 36.0–46.0)
Hemoglobin: 8.8 g/dL — ABNORMAL LOW (ref 12.0–15.0)
Immature Granulocytes: 0 %
Lymphocytes Relative: 4 %
Lymphs Abs: 0.5 10*3/uL — ABNORMAL LOW (ref 0.7–4.0)
MCH: 31 pg (ref 26.0–34.0)
MCHC: 33.2 g/dL (ref 30.0–36.0)
MCV: 93.3 fL (ref 80.0–100.0)
Monocytes Absolute: 1.7 10*3/uL — ABNORMAL HIGH (ref 0.1–1.0)
Monocytes Relative: 13 %
Neutro Abs: 11 10*3/uL — ABNORMAL HIGH (ref 1.7–7.7)
Neutrophils Relative %: 83 %
Platelet Count: 268 10*3/uL (ref 150–400)
RBC: 2.84 MIL/uL — ABNORMAL LOW (ref 3.87–5.11)
RDW: 17.5 % — ABNORMAL HIGH (ref 11.5–15.5)
WBC Count: 13.3 10*3/uL — ABNORMAL HIGH (ref 4.0–10.5)
nRBC: 0 % (ref 0.0–0.2)

## 2024-03-20 MED ORDER — SODIUM CHLORIDE 0.9 % IV SOLN: Freq: Once | INTRAVENOUS | Status: AC

## 2024-03-20 MED ORDER — POTASSIUM CHLORIDE 10 MEQ/100ML IV SOLN
10.0000 meq | INTRAVENOUS | Status: AC
  Administered 2024-03-20 (×3): 10 meq via INTRAVENOUS
  Filled 2024-03-20 (×3): qty 100

## 2024-03-20 MED ORDER — POTASSIUM CHLORIDE 10 MEQ/100ML IV SOLN: 10.0000 meq | INTRAVENOUS | Status: DC

## 2024-03-20 MED ORDER — HEPARIN SOD (PORK) LOCK FLUSH 100 UNIT/ML IV SOLN
500.0000 [IU] | Freq: Once | INTRAVENOUS | Status: AC
  Administered 2024-03-20: 500 [IU]

## 2024-03-20 MED ORDER — SODIUM CHLORIDE 0.9% FLUSH
10.0000 mL | Freq: Once | INTRAVENOUS | Status: AC
  Administered 2024-03-20: 10 mL

## 2024-03-20 MED ORDER — POTASSIUM CHLORIDE CRYS ER 20 MEQ PO TBCR
20.0000 meq | EXTENDED_RELEASE_TABLET | Freq: Once | ORAL | Status: AC
  Administered 2024-03-20: 20 meq via ORAL
  Filled 2024-03-20: qty 1

## 2024-03-20 MED ORDER — MIRTAZAPINE 7.5 MG PO TABS: 7.5000 mg | ORAL_TABLET | Freq: Every day | ORAL | 0 refills | Status: DC

## 2024-03-20 NOTE — Assessment & Plan Note (Signed)
 [id number], stage III -Patient had 1 episode of abdominal pain in October 2023, and was found to have a large gallbladder stone. -She underwent laparoscopic attempted cholecystectomy but was not able to be resected due to adhesions to duodenum and colon. -She developed jaundice in June 2024, status post ERCP and stent placement in [place].  CT and MRI scan showed a liver mass adjacent to the gallbladder fossae, and a biopsy confirmed adenocarcinoma.  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  Unfortunately this was felt to be not resectable by Dr. Dasie. -I reviewed her CT chest from April 22, 2023, which showed no evidence of distant metastasis. -I recommended first-line cisplatin , gemcitabine   every 14 days and durvalumab  every 28 days.  If she responded well, will stop chemo after 4 to 6 months of treatment, and changed to maintenance therapy. She started on 05/03/23 every 2 weeks and has been tolerating well.  Chemo changed to maintenance durvalumab  on 11/22/2023 -restaging CT 07/08/2023 showed partial response in the primary site tumor with direct invasion to liver.  No new disease or question.   -case reviewed in GI tumor board, consolidation RT is also an option. She started RT and Xeloda  on 12/09/2023 and completed on 01/15/2024 -plan to continue durvalumab 

## 2024-03-20 NOTE — Telephone Encounter (Signed)
 Critical lab value reported: K+ 2.4 Notified Dr. Lanny.  Dr. Lanny is checking with Infusion to see if pt could get IV K+ today.

## 2024-03-20 NOTE — Patient Instructions (Signed)
 Hypokalemia Hypokalemia means that the amount of potassium in the blood is lower than normal. Potassium is a mineral (electrolyte) that helps regulate the amount of fluid in the body. It also stimulates muscle tightening (contraction) and helps nerves work properly. Normally, most of the body's potassium is inside cells, and only a very small amount is in the blood. Because the amount in the blood is so small, minor changes to potassium levels in the blood can be life-threatening. What are the causes? This condition may be caused by: Antibiotic medicine. Diarrhea or vomiting. Taking too much of a medicine that helps you have a bowel movement (laxative) can cause diarrhea and lead to hypokalemia. Chronic kidney disease (CKD). Medicines that help the body get rid of excess fluid (diuretics). Eating disorders, such as anorexia or bulimia. Low magnesium levels in the body. Sweating a lot. What are the signs or symptoms? Symptoms of this condition include: Weakness. Constipation. Fatigue. Muscle cramps. Mental confusion. Skipped heartbeats or irregular heartbeat (palpitations). Tingling or numbness. How is this diagnosed? This condition is diagnosed with a blood test. How is this treated? This condition may be treated by: Taking potassium supplements. Adjusting the medicines that you take. Eating more foods that contain a lot of potassium. If your potassium level is very low, you may need to get potassium through an IV and be monitored in the hospital. Follow these instructions at home: Eating and drinking  Eat a healthy diet. A healthy diet includes fresh fruits and vegetables, whole grains, healthy fats, and lean proteins. If told, eat more foods that contain a lot of potassium. These include: Nuts, such as peanuts and pistachios. Seeds, such as sunflower seeds and pumpkin seeds. Peas, lentils, and lima beans. Whole grain and bran cereals and breads. Fresh fruits and vegetables,  such as apricots, avocado, bananas, cantaloupe, kiwi, oranges, tomatoes, asparagus, and potatoes. Juices, such as orange, tomato, and prune. Lean meats, including fish. Milk and milk products, such as yogurt. General instructions Take over-the-counter and prescription medicines only as told by your health care provider. This includes vitamins, natural food products, and supplements. Keep all follow-up visits. This is important. Contact a health care provider if: You have weakness that gets worse. You feel your heart pounding or racing. You vomit. You have diarrhea. You have diabetes and you have trouble keeping your blood sugar in your target range. Get help right away if: You have chest pain. You have shortness of breath. You have vomiting or diarrhea that lasts for more than 2 days. You faint. These symptoms may be an emergency. Get help right away. Call 911. Do not wait to see if the symptoms will go away. Do not drive yourself to the hospital. Summary Hypokalemia means that the amount of potassium in the blood is lower than normal. This condition is diagnosed with a blood test. Hypokalemia may be treated by taking potassium supplements, adjusting the medicines that you take, or eating more foods that are high in potassium. If your potassium level is very low, you may need to get potassium through an IV and be monitored in the hospital. This information is not intended to replace advice given to you by your health care provider. Make sure you discuss any questions you have with your health care provider. Document Revised: 05/25/2021 Document Reviewed: 05/25/2021 Elsevier Patient Education  2024 [employer].

## 2024-03-20 NOTE — Progress Notes (Signed)
 Per Dr. Lanny d/t transportation requirements OK to stop third run of potassium early. Pt received an approximate total of 23 mEq of IV potassium and 10 mEq of oral potassium for a total of 33 mEq of potassium today.

## 2024-03-20 NOTE — Progress Notes (Signed)
 Spoke with Ellouise at [facility] to inform them that pt's K+ is critically low so Dr. Lanny is given the pt IV K+ today while in clinic.  Stated pt should be done around 5:30pm today.  Ellouise stated she would make Sim (driver for [employer]) aware that the pt will not be available until around 5:30pm tonight.

## 2024-03-20 NOTE — Progress Notes (Signed)
 [facility]   [phone] Fax:([phone]   Clinic Follow up Note   Patient Care Team: Chrystal Lamarr RAMAN, MD as PCP - General (Family Medicine) Court Dorn PARAS, MD as PCP - Cardiology (Cardiology) Lanny Callander, MD as Consulting Physician (Oncology)  Date of Service:  03/20/2024  CHIEF COMPLAINT: f/u of gallbladder cancer  CURRENT THERAPY:  Supportive care  Oncology History   Gallbladder cancer (HCC) cT4N0M0, stage III -Patient had 1 episode of abdominal pain in October 2023, and was found to have a large gallbladder stone. -She underwent laparoscopic attempted cholecystectomy but was not able to be resected due to adhesions to duodenum and colon. -She developed jaundice in June 2024, status post ERCP and stent placement in CBD.  CT and MRI scan showed a liver mass adjacent to the gallbladder fossae, and a biopsy confirmed adenocarcinoma.  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  Unfortunately this was felt to be not resectable by Dr. Dasie. -I reviewed her CT chest from April 22, 2023, which showed no evidence of distant metastasis. -I recommended first-line cisplatin , gemcitabine   every 14 days and durvalumab  every 28 days.  If she responded well, will stop chemo after 4 to 6 months of treatment, and changed to maintenance therapy. She started on 05/03/23 every 2 weeks and has been tolerating well.  Chemo changed to maintenance durvalumab  on 11/22/2023 -restaging CT 07/08/2023 showed partial response in the primary site tumor with direct invasion to liver.  No new disease or question.   -case reviewed in GI tumor board, consolidation RT is also an option. 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 Tumor marker CA 19-9 was 567 in April post-radiation. Awaiting current tumor marker results. Evaluating response to radiation therapy in July by CT scan.  - Due to her recent hospital admission for sepsis and a decubitus wound, durvalumab  has been held.  Discussed restarting immunotherapy, which is not expected to impact wound healing. Immunotherapy aims to activate the immune system to fight cancer cells. - Restart immunotherapy on July 10 and continue on August 7 - Order CT scan at the end of July to evaluate response to radiation and check pneumonia resolution  Pressure ulcers Multiple pressure ulcers near the sacrum with a tunnel connecting them. Healing is slow despite daily packing. Receiving physical, occupational, and group therapy to aid recovery. - Continue current wound care regimen - Encourage physical therapy and occupational therapy participation - Request pictures of the wound for next visit  Anemia Anemia with hemoglobin at 8.8. Possible contributing factors include infection and hemorrhoidal bleeding. No significant bleeding reported. - Check iron  studies and B12 levels at next visit  Atrial fibrillation Atrial fibrillation is asymptomatic.  Diarrhea Intermittent diarrhea managed with Imodium. No specific cause identified.  Aspiration pneumonia (resolved) Aspiration pneumonia occurred during recent hospitalization, now resolved.  Sleep disturbance and appetite loss Sleep disturbance and loss of appetite post-radiation. Currently taking Trazodone  for sleep. Discussed starting Mirtazapine to aid sleep and boost appetite. - Consider starting Mirtazapine to aid sleep and boost appetite - Monitor for drowsiness if combined with Trazodone   Severe hypokalemia - Serum potassium 2.4 today, probably related to her diarrhea - Will give her IV potassium 30 mill equal today, oral potassium 10 mill equal  today here - Recommend oral potassium 20 mill or equal twice  daily at the nursing home, and repeat electrolytes next week. - Lab, follow-up and restarting durvalumab  in 2 weeks - Repeating CT scan in 4 to 5 weeks   SUMMARY OF ONCOLOGIC HISTORY: Oncology History Overview Note   Cancer Staging  Gallbladder cancer [facility]) Staging form: Distal Bile Duct, AJCC 8th Edition - Clinical stage from 04/09/2023: Stage IIIB (cT4, cN0, cM0) - Signed by Lanny Callander, MD on 04/18/2023 Total positive nodes: 0 Histologic grade (G): G2 Histologic grading system: 3 grade system     Gallbladder cancer (HCC)  04/04/2023 Imaging    IMPRESSION: 1. Ill-defined hypodensity in the region of the gallbladder fossa appears similar to the prior study given lack of contrast. This would be better evaluated with dedicated MRI. 2. Nonobstructing right renal calculus. 3. Large amount of stool throughout the colon. 4. Stable indeterminate lucent areas in the vertebral bodies of the lumbar spine. 5. Severe atherosclerotic disease.     04/09/2023 Pathology Results     FINAL MICROSCOPIC DIAGNOSIS:   A. LIVER, ADJACENT TO GALLBLADDER FOSSA, BIOPSY:  - Moderately differentiated adenocarcinoma    04/09/2023 Cancer Staging   Staging form: Distal Bile Duct, AJCC 8th Edition - Clinical stage from 04/09/2023: Stage IIIB (cT4, cN0, cM0) - Signed by Lanny Callander, MD on 04/18/2023 Total positive nodes: 0 Histologic grade (G): G2 Histologic grading system: 3 grade system   04/18/2023 Initial Diagnosis   Cholangiocarcinoma of liver (HCC)   05/03/2023 - 11/08/2023 Chemotherapy   Patient is on Treatment Plan : BILIARY TRACT Cisplatin  + Gemcitabine  D1,15 + Durvalumab  (1500) D1 q28d / Durvalumab  (1500) q28d     07/08/2023 Imaging   CT Chest abdomen and pelvis with contrast  IMPRESSION: CT CHEST IMPRESSION  1.  No acute process or evidence of metastatic disease in the chest. 2. Left Port-A-Cath tip in azygous vein. This is being adjusted by interventional radiology later today. 3. Ongoing  stability of tiny bilateral pulmonary nodules, favored to be benign. 4.  Aortic Atherosclerosis (ICD10-I70.0).  CT ABDOMEN AND PELVIS IMPRESSION  1. The segment 5 liver lesion is decreased in size compared to the MRI of 04/05/2023. 2. Placement of a biliary stent with improvement, near complete resolution of duct dilatation. 3. No abdominopelvic adenopathy. 4. Proximal gastric underdistention. Apparent wall thickening could be secondary. Correlate with symptoms of gastritis 5. Subtle findings within the superior spleen for which small volume interval splenic infarct cannot be excluded. Correlate with left upper quadrant symptoms. 6.  Possible constipation.     09/27/2023 Imaging   CT chest abdomen and pelvis with contrast  IMPRESSION: CHEST:  1. Stable small pulmonary nodules. 2. No evidence of thoracic metastasis.  PELVIS:  1. Stable hepatic metastasis. 2. No new hepatic lesions. 3. No evidence of metastatic adenopathy in the abdomen pelvis. 4. Intrahepatic and extrahepatic biliary stent in place. No biliary duct dilatation. 5.  Aortic Atherosclerosis (ICD10-I70.0).   11/22/2023 -  Chemotherapy   Patient is on Treatment Plan : LUNG NSCLC Durvalumab  (1500) q28d        Discussed the use of AI scribe software for clinical note transcription with the patient, who gave verbal consent to proceed.  History of Present Illness [name] is an 83 year old female with cholangiocarcinoma who presents for follow-up.  She was recently hospitalized for sepsis and aspiration pneumonia, with a high fever of 103F, and is now at [facility] nursing home. She experienced vomiting prior  to hospitalization.  Post-radiation therapy, she has significant loss of appetite and is taking nutritional supplements to aid in healing. She describes the supplements as 'milkshakes' and another product similar to 'motor oil'.  She is undergoing physical, occupational, and group therapy to regain strength, as  she was unable to stand during her hospital admission. She needs to lie down frequently to relieve pressure from her sores.  She is currently on doxycycline , pending culture results for potential antibiotic change. She experiences periodic diarrhea, managed with Imodium.  Her hemoglobin level is 8.8 g/dL, with occasional bleeding from hemorrhoids, which may contribute to her anemia. She does not experience stomach pain and had no significant pain during radiation therapy, but did experience fatigue and loss of appetite.     All other systems were reviewed with the patient and are negative.  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 There is a small calcified mass in the L:A near the LA appendage. No valvular masses seen with associated mitral annular calcification. LA Volume/ BSA27.4 ml/m2 No AS. Right ventricular systolic pressure is elevated at .   Jaundice    Myocardial infarction [facility])    June 2021   Neuromuscular disorder (HCC)    neuropathy in bilateral feet   Peripheral vascular disease (HCC)    Pneumonia    not hosp.    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  PTCAs with drug coated balloon Lt ant tibial artery and Lt popliteal artery  11/26/2019    SURGICAL HISTORY: Past Surgical History:  Procedure Laterality Date   ABDOMINAL AORTAGRAM  11/25/2019   ABDOMINAL AORTOGRAM  W/LOWER EXTREMITY    ABDOMINAL AORTOGRAM N/A 06/15/2019   Procedure: ABDOMINAL AORTOGRAM;  Surgeon: Court Dorn PARAS, MD;  [facility];  Service: Cardiovascular;  Laterality: N/A;   ABDOMINAL AORTOGRAM W/LOWER EXTREMITY N/A 11/25/2019   Procedure: ABDOMINAL AORTOGRAM W/LOWER EXTREMITY;  Surgeon: Darron Deatrice LABOR, MD;  [facility];  Service: Cardiovascular;  Laterality: N/A;  Lt leg   ABDOMINAL AORTOGRAM W/LOWER EXTREMITY N/A 08/01/2020   Procedure: ABDOMINAL AORTOGRAM W/LOWER EXTREMITY;  Surgeon: Court Dorn PARAS, MD;  [facility];  Service: Cardiovascular;  Laterality: N/A;   ABDOMINAL AORTOGRAM W/LOWER EXTREMITY N/A 04/28/2021   Procedure: ABDOMINAL AORTOGRAM W/LOWER EXTREMITY;  Surgeon: Harvey Carlin BRAVO, MD;  [facility];  Service: Cardiovascular;  Laterality: N/A;   ABDOMINAL AORTOGRAM W/LOWER EXTREMITY N/A 10/13/2021   Procedure: ABDOMINAL AORTOGRAM W/LOWER EXTREMITY;  Surgeon: Magda Debby SAILOR, MD;  [facility];  Service: Cardiovascular;  Laterality: N/A;   ABDOMINAL AORTOGRAM W/LOWER EXTREMITY N/A 11/09/2022   Procedure: ABDOMINAL AORTOGRAM W/LOWER EXTREMITY;  Surgeon: Magda Debby SAILOR, MD;  [facility];  Service: Cardiovascular;  Laterality: N/A;  ABDOMINAL AORTOGRAM W/LOWER EXTREMITY N/A 11/16/2022   Procedure: ABDOMINAL AORTOGRAM W/LOWER EXTREMITY;  Surgeon: Magda Debby SAILOR, MD;  [facility];  Service: Cardiovascular;  Laterality: N/A;   AMPUTATION TOE Left 05/03/2021   Procedure: AMPUTATION OF THIRD LEFT  TOE;  Surgeon: Magda Debby SAILOR, MD;  Location: [facility] OR;  Service: Vascular;  Laterality: Left;   APPENDECTOMY     APPLICATION OF WOUND VAC Right 07/21/2019   Procedure: Application Of Prevena Wound Vac Right Groin;  Surgeon: Serene Gaile ORN, MD;  Location: [facility] OR;  Service: Vascular;  Laterality: Right;   BILIARY BRUSHING  04/07/2023   Procedure: BILIARY BRUSHING;   Surgeon: Rosalie Kitchens, MD;  Location: [facility] ENDOSCOPY;  Service: Gastroenterology;;   BILIARY STENT PLACEMENT  04/07/2023   Procedure: BILIARY STENT PLACEMENT;  Surgeon: Rosalie Kitchens, MD;  Location: [facility] ENDOSCOPY;  Service: Gastroenterology;;   CARPAL TUNNEL RELEASE Left    CARPAL TUNNEL RELEASE Right    CESAREAN SECTION     x 2   CHOLECYSTECTOMY N/A 10/12/2022   Procedure: LAPAROSCOPIC PARTIAL FENESTRATING CHOLECYSTECTOMY;  Surgeon: Dasie Leonor CROME, MD;  [facility];  Service: General;  Laterality: N/A;   COLONOSCOPY     CORONARY ARTERY BYPASS GRAFT N/A 03/24/2020   Procedure: CORONARY ARTERY BYPASS GRAFTING (CABG) using LIMA to LAD (m); RIMA to RAMUS; Endoscopic Right Greater Saphenous Vein: SVG to Diag1; SVG to PLB (right); and SVG to PL (left).;  Surgeon: German Bartlett PEDLAR, MD;  [facility];  Service: Open Heart Surgery;  Laterality: N/A;  BILATERAL IMA   ENDARTERECTOMY FEMORAL Right 07/21/2019   Procedure: RIGHT ENDARTERECTOMY FEMORAL WITH PATCH ANGIOPLASTY;  Surgeon: Serene Gaile ORN, MD;  [facility];  Service: Vascular;  Laterality: Right;   ENDARTERECTOMY FEMORAL Right 08/16/2020   Procedure: RIGHT FEMORAL ENDARTERECTOMY;  Surgeon: Sheree Penne Bruckner, MD;  Location: [facility] OR;  Service: Vascular;  Laterality: Right;   ENDOVEIN HARVEST OF GREATER SAPHENOUS VEIN Right 03/24/2020   Procedure: ENDOVEIN HARVEST OF GREATER SAPHENOUS VEIN;  Surgeon: German Bartlett PEDLAR, MD;  [facility];  Service: Open Heart Surgery;  Laterality: Right;   ERCP N/A 04/07/2023   Procedure: ENDOSCOPIC RETROGRADE CHOLANGIOPANCREATOGRAPHY (ERCP);  Surgeon: Rosalie Kitchens, MD;  Location: [facility] ENDOSCOPY;  Service: Gastroenterology;  Laterality: N/A;   EYE SURGERY     cataract removal bilaterally   FEMORAL-POPLITEAL BYPASS GRAFT Right 08/16/2020   Procedure: BYPASS GRAFT FEMORAL-POPLITEAL ARTERY;  Surgeon: Sheree Penne Bruckner, MD;  Location: [facility] OR;  Service: Vascular;  Laterality: Right;    FEMORAL-POPLITEAL BYPASS GRAFT Left 05/03/2021   Procedure: LEFT FEMORAL TO BELOW KNEE POPLITEAL ARTERY BYPASS GRAFTING WITH 6MMX80 PTFE GRAFT;  Surgeon: Magda Debby SAILOR, MD;  [facility];  Service: Vascular;  Laterality: Left;   LEFT HEART CATH AND CORONARY ANGIOGRAPHY N/A 03/22/2020   Procedure: LEFT HEART CATH AND CORONARY ANGIOGRAPHY;  Surgeon: [place]az[place], Peter M, MD;  Location: [facility] INVASIVE CV LAB;  Service: Cardiovascular;  Laterality: N/A;   LOWER EXTREMITY ANGIOGRAPHY Bilateral 06/15/2019   Procedure: Lower Extremity Angiography;  Surgeon: Court Dorn PARAS, MD;  Location: [facility] INVASIVE CV LAB;  Service: Cardiovascular;  Laterality: Bilateral;   LOWER EXTREMITY ANGIOGRAPHY Right 08/03/2019   Procedure: LOWER EXTREMITY ANGIOGRAPHY;  Surgeon: Court Dorn PARAS, MD;  [facility];  Service: Cardiovascular;  Laterality: Right;   LOWER EXTREMITY ANGIOGRAPHY N/A 09/07/2021   Procedure: LOWER EXTREMITY ANGIOGRAPHY;  Surgeon: Gretta Bruckner PARAS, MD;  [facility];  Service: Cardiovascular;  Laterality: N/A;   PATCH ANGIOPLASTY Right 07/21/2019   Procedure: Patch Angioplasty Right Femoral Artery;  Surgeon: Serene Gaile ORN, MD;  Location: [facility] OR;  Service: Vascular;  Laterality: Right;   PERIPHERAL INTRAVASCULAR LITHOTRIPSY  11/25/2019   Procedure: INTRAVASCULAR LITHOTRIPSY;  Surgeon: Darron Deatrice LABOR, MD;  [facility];  Service: Cardiovascular;;  LT. SFA   PERIPHERAL VASCULAR ATHERECTOMY  08/03/2019   Procedure: PERIPHERAL VASCULAR ATHERECTOMY;  Surgeon: Court Dorn PARAS, MD;  Location: [facility] INVASIVE CV LAB;  Service: Cardiovascular;;  right SFA, right TP trunk   PERIPHERAL VASCULAR ATHERECTOMY  11/25/2019   Procedure: PERIPHERAL VASCULAR ATHERECTOMY;  Surgeon: Darron Deatrice LABOR, MD;  [facility];  Service: Cardiovascular;;  Lt.  POPLITEAL and AT   PERIPHERAL VASCULAR BALLOON ANGIOPLASTY Left 06/15/2019   Procedure: PERIPHERAL  VASCULAR BALLOON ANGIOPLASTY;  Surgeon: Court Dorn PARAS, MD;  [facility];  Service: Cardiovascular;  Laterality: Left;  SFA UNSUCCESSFUL UNABLE TO CROSS LESION   PERIPHERAL VASCULAR BALLOON ANGIOPLASTY  08/03/2019   Procedure: PERIPHERAL VASCULAR BALLOON ANGIOPLASTY;  Surgeon: Court Dorn PARAS, MD;  [facility];  Service: Cardiovascular;;  right SFA, Right TP trunk   PERIPHERAL VASCULAR BALLOON ANGIOPLASTY  09/07/2021   Procedure: PERIPHERAL VASCULAR BALLOON ANGIOPLASTY;  Surgeon: Gretta Lonni PARAS, MD;  [facility];  Service: Cardiovascular;;   PERIPHERAL VASCULAR BALLOON ANGIOPLASTY Right 10/13/2021   Procedure: PERIPHERAL VASCULAR BALLOON ANGIOPLASTY;  Surgeon: Magda Debby SAILOR, MD;  [facility];  Service: Cardiovascular;  Laterality: Right;   PERIPHERAL VASCULAR BALLOON ANGIOPLASTY  11/09/2022   Procedure: PERIPHERAL VASCULAR BALLOON ANGIOPLASTY;  Surgeon: Magda Debby SAILOR, MD;  [facility];  Service: Cardiovascular;;  fem-pop bypass and AT   PORTACATH PLACEMENT Left 04/29/2023   Procedure: INSERTION PORT-A-CATH;  Surgeon: Dasie Leonor CROME, MD;  Location: [facility] ORS;  Service: General;  Laterality: Left;   PORTACATH PLACEMENT N/A 07/12/2023   Procedure: REVISION PORT-A-CATH;  Surgeon: Dasie Leonor CROME, MD;  Location: [facility] OR;  Service: General;  Laterality: N/A;   REMOVAL OF STONES  04/07/2023   Procedure: REMOVAL OF STONES;  Surgeon: Rosalie Kitchens, MD;  Location: [facility] ENDOSCOPY;  Service: Gastroenterology;;   ANNETT  04/07/2023   Procedure: ANNETT;  Surgeon: Rosalie Kitchens, MD;  Location: [facility] ENDOSCOPY;  Service: Gastroenterology;;   TEE WITHOUT CARDIOVERSION N/A 03/24/2020   Procedure: TRANSESOPHAGEAL ECHOCARDIOGRAM (TEE);  Surgeon: German Bartlett PEDLAR, MD;  Location: [facility] OR;  Service: Open Heart Surgery;  Laterality: N/A;   TONSILLECTOMY     and adenoidectomy   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/2019    Procedure: TRANSMETATARSAL AMPUTATION;  Surgeon: Gershon Donnice SAUNDERS, DPM;  [facility] OR;  Service: Podiatry;  Laterality: Right;   TRANSMETATARSAL AMPUTATION Left 05/11/2021   Procedure: LEFT TRANSMETATARSAL AMPUTATION;  Surgeon: Sheree Penne Lonni, MD;  Location: [facility] OR;  Service: Vascular;  Laterality: Left;   TUBAL LIGATION      I have reviewed the social history and family history with the patient and they are unchanged from previous note.  ALLERGIES:  is allergic to versed  [midazolam ], augmentin [amoxicillin-pot clavulanate], bactrim  [sulfamethoxazole -trimethoprim ], demerol [meperidine], meperidine hcl, scopolamine, and tramadol .  MEDICATIONS:  Current Outpatient Medications  Medication Sig Dispense Refill   mirtazapine (REMERON) 7.5 MG tablet Take 1 tablet (7.5 mg total) by mouth at bedtime. 30 tablet 0   brimonidine  (ALPHAGAN ) 0.2 % ophthalmic solution Place 1 drop into the right eye 2 (two) times daily.  empagliflozin  (JARDIANCE ) 25 MG TABS tablet Take 1 tablet (25 mg total) by mouth daily before breakfast. 90 tablet 3   feeding supplement (ENSURE ENLIVE / ENSURE PLUS) LIQD Take 237 mLs by mouth 2 (two) times daily between meals.     Insulin  Lispro Prot & Lispro (HUMALOG  MIX 75/25 KWIKPEN) (75-25) 100 UNIT/ML Kwikpen INJECT 6 UNITS SUBCUTANEOUSLY ONCE DAILY WITH BREAKFAST 15 mL 4   Insulin  Pen Needle (ULTRA-THIN II MINI PEN NEEDLE) 31G X 5 MM MISC Use as directed to check blood glucose 50 each 2   Lancets (ONETOUCH DELICA PLUS LANCET33G) MISC Use to check blood sugars twice daily. (Patient taking differently: Use to check blood sugars  daily.) 100 each 5   MAGnesium -Oxide 400 (240 Mg) MG tablet Take 1 tablet by mouth twice daily 60 tablet 2   metFORMIN  (GLUCOPHAGE ) 1000 MG tablet Take 1 tablet (1,000 mg total) by mouth 2 (two) times daily. 180 tablet 2   metoprolol  tartrate (LOPRESSOR ) 25 MG tablet Take 1 tablet (25 mg total) by mouth 2 (two) times daily.     mupirocin   ointment (BACTROBAN ) 2 % Apply 1 Application topically 2 (two) times daily. (Patient taking differently: Apply 1 Application topically 2 (two) times daily as needed (wound care for toes).) 30 g 2   ONETOUCH VERIO test strip USE TO CHECK BLOOD SUGARS ONCE DAILY 100 each 2   polyethylene glycol (MIRALAX  / GLYCOLAX ) 17 g packet Take 17 g by mouth daily.     Propylene Glycol (SYSTANE BALANCE) 0.6 % SOLN Place 1 drop into the left eye 3 (three) times daily as needed (dry/irritated eyes).     repaglinide  (PRANDIN ) 1 MG tablet Take 1 tablet (1 mg total) by mouth 2 (two) times daily before a meal. Take 1 tab by breakfast and 1 tab before dinner 180 tablet 4   Rivaroxaban  (XARELTO ) 15 MG TABS tablet Take 1 tablet (15 mg total) by mouth daily with supper.     rosuvastatin  (CRESTOR ) 40 MG tablet Take 1 tablet (40 mg total) by mouth daily. 90 tablet 3   senna (SENOKOT) 8.6 MG TABS tablet Take 2 tablets (17.2 mg total) by mouth 2 (two) times daily.     traZODone  (DESYREL ) 50 MG tablet Take 1 tablet (50 mg total) by mouth at bedtime as needed for sleep.     No current facility-administered medications for this visit.    PHYSICAL EXAMINATION: ECOG PERFORMANCE STATUS: 2 - Symptomatic, <50% confined to bed  Vitals:   03/20/24 1400 03/20/24 1401  BP: (!) 118/40 (!) 118/42  Pulse: (!) 103 (!) 103  Resp: 15   Temp: 97.9 F (36.6 C)   SpO2: 96% 100%   Wt Readings from Last 3 Encounters:  03/20/24 116 lb 9.6 oz (52.9 kg)  02/13/24 114 lb (51.7 kg)  02/06/24 114 lb (51.7 kg)     GENERAL:alert, no distress and comfortable SKIN: skin color, texture, turgor are normal, no rashes or significant lesions EYES: normal, Conjunctiva are pink and non-injected, sclera clear NECK: supple, thyroid  normal size, non-tender, without nodularity LYMPH:  no palpable lymphadenopathy in the cervical, axillary  LUNGS: clear to auscultation and percussion with normal breathing effort HEART: regular rate & rhythm and no  murmurs and no lower extremity edema ABDOMEN:abdomen soft, non-tender and normal bowel sounds Musculoskeletal:no cyanosis of digits and no clubbing  NEURO: alert & oriented x 3 with fluent speech, no focal motor/sensory deficits  Physical Exam    LABORATORY DATA:  I have reviewed the  data as listed    Latest Ref Rng & Units 03/20/2024    1:40 PM 02/18/2024    2:00 AM 02/17/2024    5:00 AM  CBC  WBC 4.0 - 10.5 K/uL 13.3  7.7  8.1   Hemoglobin 12.0 - 15.0 g/dL 8.8  88.7  88.3   Hematocrit 36.0 - 46.0 % 26.5  33.6  35.9   Platelets 150 - 400 K/uL 268  99  100         Latest Ref Rng & Units 03/20/2024    1:40 PM 02/18/2024    2:00 AM 02/17/2024    5:00 AM  CMP  Glucose 70 - 99 mg/dL 886  830  827   BUN 8 - 23 mg/dL 61  25  27   Creatinine 0.44 - 1.00 mg/dL 8.94  9.07  9.09   Sodium 135 - 145 mmol/L 140  138  139   Potassium 3.5 - 5.1 mmol/L 2.4  4.2  4.0   Chloride 98 - 111 mmol/L 99  109  111   CO2 22 - 32 mmol/L 30  23  23    Calcium  8.9 - 10.3 mg/dL 9.5  9.1  9.2   Total Protein 6.5 - 8.1 g/dL 5.4  4.2  4.4   Total Bilirubin 0.0 - 1.2 mg/dL 0.5  0.4  0.6   Alkaline Phos 38 - 126 U/L 207  158  178   AST 15 - 41 U/L 27  92  170   ALT 0 - 44 U/L 17  263  374       RADIOGRAPHIC STUDIES: I have personally reviewed the radiological images as listed and agreed with the findings in the report. MR SACRUM SI JOINTS W WO CONTRAST Result Date: 03/19/2024 EXAM DESCRIPTION: MR SACRUM SI JOINTS W WO CONTRAST CLINICAL HISTORY: 83 years, Female, osteomyelitis. COMPARISON: None TECHNIQUE: MRI of the sacrum and SI joints was performed with multiplanar multi sequence imaging according to our usual protocol. FINDINGS: There is a soft tissue ulceration overlying the lower coccyx/inferior to the right aspect of the coccyx. Moderate phlegmonous changes. The wound dissects cranially and laterally into the right gluteal musculature where there is a curvilinear fluid collection containing blebs of  gas. This measures 6.6 x 1.9 cm in axial dimension and appears to be draining through the skin surface. Moderate associated myositis. Moderate diffuse posterior subcutaneous edema. There is mild marrow edema to the underlying distal coccyx likely reflecting mild osteomyelitis. The marrow signal is otherwise unremarkable. IMPRESSION: Decubitus ulcer to the right of the coccyx and extending inferiorly. There is also cranial and lateral extension towards the right gluteus maximus musculature where there is a curvilinear abscess measuring 6.6 x 1.9 cm in axial dimension. This is draining through the skin surface. Moderate associated myositis. The wound dissects towards the inferior coccyx where there is mild marrow edema likely reflecting mild osteomyelitis. Moderate subcutaneous edema posteriorly suggesting cellulitis. Electronically signed by: Reyes Frees MD 03/19/2024 09:46 PM EDT RP Workstation: [id number]      Orders Placed This Encounter  Procedures   CT CHEST ABDOMEN PELVIS W CONTRAST    Standing Status:   Future    Expected Date:   04/23/2024    Expiration Date:   03/20/2025    If indicated for the ordered procedure, I authorize the administration of contrast media per Radiology protocol:   Yes    Does the patient have a contrast media/X-ray dye allergy?:   No    Preferred  imaging location?:   [facility]    Release to patient:   Immediate    If indicated for the ordered procedure, I authorize the administration of oral contrast media per Radiology protocol:   Yes   CBC with Differential (Cancer Center Only)    Standing Status:   Future    Expected Date:   05/28/2024    Expiration Date:   05/28/2025   CMP (Cancer Center only)    Standing Status:   Future    Expected Date:   05/28/2024    Expiration Date:   05/28/2025   T4    Standing Status:   Future    Expected Date:   05/28/2024    Expiration Date:   05/28/2025   TSH    Standing Status:   Future    Expected Date:   05/28/2024     Expiration Date:   05/28/2025   Ferritin    Standing Status:   Future    Expected Date:   04/03/2024    Expiration Date:   03/20/2025   Vitamin B12    Standing Status:   Future    Expected Date:   04/03/2024    Expiration Date:   03/20/2025   All questions were answered. The patient knows to call the clinic with any problems, questions or concerns. No barriers to learning was detected. The total time spent in the appointment was 40 minutes, including review of chart and various tests results, discussions about plan of care and coordination of care plan     Onita Mattock, MD 03/20/2024

## 2024-03-21 LAB — CANCER ANTIGEN 19-9: CA 19-9: 466 U/mL — ABNORMAL HIGH (ref 0–35)

## 2024-03-24 DIAGNOSIS — C23 Malignant neoplasm of gallbladder: Secondary | ICD-10-CM | POA: Diagnosis present

## 2024-03-24 DIAGNOSIS — C787 Secondary malignant neoplasm of liver and intrahepatic bile duct: Secondary | ICD-10-CM | POA: Diagnosis present

## 2024-03-24 DIAGNOSIS — K529 Noninfective gastroenteritis and colitis, unspecified: Secondary | ICD-10-CM | POA: Diagnosis not present

## 2024-03-24 DIAGNOSIS — E119 Type 2 diabetes mellitus without complications: Secondary | ICD-10-CM | POA: Diagnosis not present

## 2024-03-24 DIAGNOSIS — Z951 Presence of aortocoronary bypass graft: Secondary | ICD-10-CM | POA: Diagnosis present

## 2024-03-24 DIAGNOSIS — Z7984 Long term (current) use of oral hypoglycemic drugs: Secondary | ICD-10-CM | POA: Diagnosis not present

## 2024-03-24 DIAGNOSIS — Z79899 Other long term (current) drug therapy: Secondary | ICD-10-CM | POA: Diagnosis not present

## 2024-03-24 DIAGNOSIS — D6859 Other primary thrombophilia: Secondary | ICD-10-CM | POA: Diagnosis not present

## 2024-03-24 DIAGNOSIS — E113493 Type 2 diabetes mellitus with severe nonproliferative diabetic retinopathy without macular edema, bilateral: Secondary | ICD-10-CM | POA: Diagnosis present

## 2024-03-24 DIAGNOSIS — E11649 Type 2 diabetes mellitus with hypoglycemia without coma: Secondary | ICD-10-CM | POA: Diagnosis present

## 2024-03-24 DIAGNOSIS — L98429 Non-pressure chronic ulcer of back with unspecified severity: Secondary | ICD-10-CM | POA: Diagnosis present

## 2024-03-24 DIAGNOSIS — D509 Iron deficiency anemia, unspecified: Secondary | ICD-10-CM | POA: Diagnosis present

## 2024-03-24 DIAGNOSIS — C249 Malignant neoplasm of biliary tract, unspecified: Secondary | ICD-10-CM | POA: Diagnosis not present

## 2024-03-24 DIAGNOSIS — Z452 Encounter for adjustment and management of vascular access device: Secondary | ICD-10-CM | POA: Diagnosis not present

## 2024-03-24 DIAGNOSIS — Z95828 Presence of other vascular implants and grafts: Secondary | ICD-10-CM | POA: Diagnosis not present

## 2024-03-24 DIAGNOSIS — R4182 Altered mental status, unspecified: Secondary | ICD-10-CM | POA: Diagnosis not present

## 2024-03-24 DIAGNOSIS — R600 Localized edema: Secondary | ICD-10-CM | POA: Diagnosis not present

## 2024-03-24 DIAGNOSIS — N183 Chronic kidney disease, stage 3 unspecified: Secondary | ICD-10-CM | POA: Diagnosis not present

## 2024-03-24 DIAGNOSIS — E161 Other hypoglycemia: Secondary | ICD-10-CM | POA: Diagnosis not present

## 2024-03-24 DIAGNOSIS — R531 Weakness: Secondary | ICD-10-CM | POA: Diagnosis not present

## 2024-03-24 DIAGNOSIS — D630 Anemia in neoplastic disease: Secondary | ICD-10-CM | POA: Diagnosis present

## 2024-03-24 DIAGNOSIS — C221 Intrahepatic bile duct carcinoma: Secondary | ICD-10-CM | POA: Diagnosis present

## 2024-03-24 DIAGNOSIS — K219 Gastro-esophageal reflux disease without esophagitis: Secondary | ICD-10-CM | POA: Diagnosis present

## 2024-03-24 DIAGNOSIS — E1151 Type 2 diabetes mellitus with diabetic peripheral angiopathy without gangrene: Secondary | ICD-10-CM | POA: Diagnosis present

## 2024-03-24 DIAGNOSIS — E113393 Type 2 diabetes mellitus with moderate nonproliferative diabetic retinopathy without macular edema, bilateral: Secondary | ICD-10-CM | POA: Diagnosis not present

## 2024-03-24 DIAGNOSIS — L89154 Pressure ulcer of sacral region, stage 4: Secondary | ICD-10-CM | POA: Diagnosis present

## 2024-03-24 DIAGNOSIS — Z681 Body mass index (BMI) 19 or less, adult: Secondary | ICD-10-CM | POA: Diagnosis not present

## 2024-03-24 DIAGNOSIS — K644 Residual hemorrhoidal skin tags: Secondary | ICD-10-CM | POA: Diagnosis not present

## 2024-03-24 DIAGNOSIS — N184 Chronic kidney disease, stage 4 (severe): Secondary | ICD-10-CM | POA: Diagnosis not present

## 2024-03-24 DIAGNOSIS — J189 Pneumonia, unspecified organism: Secondary | ICD-10-CM | POA: Diagnosis not present

## 2024-03-24 DIAGNOSIS — M4628 Osteomyelitis of vertebra, sacral and sacrococcygeal region: Secondary | ICD-10-CM | POA: Diagnosis present

## 2024-03-24 DIAGNOSIS — L89156 Pressure-induced deep tissue damage of sacral region: Secondary | ICD-10-CM | POA: Diagnosis not present

## 2024-03-24 DIAGNOSIS — R634 Abnormal weight loss: Secondary | ICD-10-CM | POA: Diagnosis not present

## 2024-03-24 DIAGNOSIS — D696 Thrombocytopenia, unspecified: Secondary | ICD-10-CM | POA: Diagnosis present

## 2024-03-24 DIAGNOSIS — D8481 Immunodeficiency due to conditions classified elsewhere: Secondary | ICD-10-CM | POA: Diagnosis not present

## 2024-03-24 DIAGNOSIS — D649 Anemia, unspecified: Secondary | ICD-10-CM | POA: Diagnosis present

## 2024-03-24 DIAGNOSIS — D638 Anemia in other chronic diseases classified elsewhere: Secondary | ICD-10-CM | POA: Diagnosis not present

## 2024-03-24 DIAGNOSIS — I10 Essential (primary) hypertension: Secondary | ICD-10-CM | POA: Diagnosis present

## 2024-03-24 DIAGNOSIS — R2689 Other abnormalities of gait and mobility: Secondary | ICD-10-CM | POA: Diagnosis not present

## 2024-03-24 DIAGNOSIS — Z9289 Personal history of other medical treatment: Secondary | ICD-10-CM | POA: Diagnosis not present

## 2024-03-24 DIAGNOSIS — R41841 Cognitive communication deficit: Secondary | ICD-10-CM | POA: Diagnosis not present

## 2024-03-24 DIAGNOSIS — M6259 Muscle wasting and atrophy, not elsewhere classified, multiple sites: Secondary | ICD-10-CM | POA: Diagnosis not present

## 2024-03-24 DIAGNOSIS — E1122 Type 2 diabetes mellitus with diabetic chronic kidney disease: Secondary | ICD-10-CM | POA: Diagnosis present

## 2024-03-24 DIAGNOSIS — I252 Old myocardial infarction: Secondary | ICD-10-CM | POA: Diagnosis not present

## 2024-03-24 DIAGNOSIS — N179 Acute kidney failure, unspecified: Secondary | ICD-10-CM | POA: Diagnosis not present

## 2024-03-24 DIAGNOSIS — G4700 Insomnia, unspecified: Secondary | ICD-10-CM | POA: Diagnosis not present

## 2024-03-24 DIAGNOSIS — I480 Paroxysmal atrial fibrillation: Secondary | ICD-10-CM | POA: Diagnosis present

## 2024-03-24 DIAGNOSIS — R197 Diarrhea, unspecified: Secondary | ICD-10-CM | POA: Diagnosis not present

## 2024-03-24 DIAGNOSIS — E162 Hypoglycemia, unspecified: Secondary | ICD-10-CM | POA: Diagnosis present

## 2024-03-24 DIAGNOSIS — E1169 Type 2 diabetes mellitus with other specified complication: Secondary | ICD-10-CM | POA: Diagnosis present

## 2024-03-24 DIAGNOSIS — E1165 Type 2 diabetes mellitus with hyperglycemia: Secondary | ICD-10-CM | POA: Diagnosis not present

## 2024-03-24 DIAGNOSIS — N189 Chronic kidney disease, unspecified: Secondary | ICD-10-CM | POA: Diagnosis not present

## 2024-03-24 DIAGNOSIS — Z8509 Personal history of malignant neoplasm of other digestive organs: Secondary | ICD-10-CM | POA: Diagnosis not present

## 2024-03-24 DIAGNOSIS — I2510 Atherosclerotic heart disease of native coronary artery without angina pectoris: Secondary | ICD-10-CM | POA: Diagnosis present

## 2024-03-24 DIAGNOSIS — E43 Unspecified severe protein-calorie malnutrition: Secondary | ICD-10-CM | POA: Diagnosis present

## 2024-03-24 DIAGNOSIS — Z794 Long term (current) use of insulin: Secondary | ICD-10-CM | POA: Diagnosis not present

## 2024-03-24 DIAGNOSIS — I129 Hypertensive chronic kidney disease with stage 1 through stage 4 chronic kidney disease, or unspecified chronic kidney disease: Secondary | ICD-10-CM | POA: Diagnosis present

## 2024-03-24 DIAGNOSIS — N1831 Chronic kidney disease, stage 3a: Secondary | ICD-10-CM | POA: Diagnosis present

## 2024-03-24 DIAGNOSIS — R1311 Dysphagia, oral phase: Secondary | ICD-10-CM | POA: Diagnosis not present

## 2024-03-24 DIAGNOSIS — E1142 Type 2 diabetes mellitus with diabetic polyneuropathy: Secondary | ICD-10-CM | POA: Diagnosis present

## 2024-03-24 DIAGNOSIS — D500 Iron deficiency anemia secondary to blood loss (chronic): Secondary | ICD-10-CM | POA: Diagnosis not present

## 2024-03-24 DIAGNOSIS — I739 Peripheral vascular disease, unspecified: Secondary | ICD-10-CM | POA: Diagnosis present

## 2024-03-24 DIAGNOSIS — Z872 Personal history of diseases of the skin and subcutaneous tissue: Secondary | ICD-10-CM | POA: Diagnosis not present

## 2024-03-24 DIAGNOSIS — I4891 Unspecified atrial fibrillation: Secondary | ICD-10-CM | POA: Diagnosis present

## 2024-03-24 DIAGNOSIS — Z83438 Family history of other disorder of lipoprotein metabolism and other lipidemia: Secondary | ICD-10-CM | POA: Diagnosis not present

## 2024-03-24 DIAGNOSIS — R609 Edema, unspecified: Secondary | ICD-10-CM | POA: Diagnosis present

## 2024-03-24 DIAGNOSIS — E875 Hyperkalemia: Secondary | ICD-10-CM | POA: Diagnosis present

## 2024-03-24 DIAGNOSIS — M6281 Muscle weakness (generalized): Secondary | ICD-10-CM | POA: Diagnosis not present

## 2024-03-24 DIAGNOSIS — R29810 Facial weakness: Secondary | ICD-10-CM | POA: Diagnosis not present

## 2024-03-24 DIAGNOSIS — I959 Hypotension, unspecified: Secondary | ICD-10-CM | POA: Diagnosis not present

## 2024-03-24 DIAGNOSIS — R000 Tachycardia, unspecified: Secondary | ICD-10-CM | POA: Diagnosis not present

## 2024-03-24 DIAGNOSIS — D631 Anemia in chronic kidney disease: Secondary | ICD-10-CM | POA: Diagnosis present

## 2024-03-24 DIAGNOSIS — R195 Other fecal abnormalities: Secondary | ICD-10-CM | POA: Diagnosis not present

## 2024-03-24 DIAGNOSIS — Z8249 Family history of ischemic heart disease and other diseases of the circulatory system: Secondary | ICD-10-CM | POA: Diagnosis not present

## 2024-03-24 DIAGNOSIS — I6521 Occlusion and stenosis of right carotid artery: Secondary | ICD-10-CM | POA: Diagnosis present

## 2024-03-24 DIAGNOSIS — L89150 Pressure ulcer of sacral region, unstageable: Secondary | ICD-10-CM | POA: Diagnosis not present

## 2024-03-24 DIAGNOSIS — D62 Acute posthemorrhagic anemia: Secondary | ICD-10-CM | POA: Diagnosis present

## 2024-03-24 DIAGNOSIS — Z7401 Bed confinement status: Secondary | ICD-10-CM | POA: Diagnosis not present

## 2024-03-24 DIAGNOSIS — E785 Hyperlipidemia, unspecified: Secondary | ICD-10-CM | POA: Diagnosis present

## 2024-03-24 DIAGNOSIS — I700 Atherosclerosis of aorta: Secondary | ICD-10-CM | POA: Diagnosis not present

## 2024-03-24 DIAGNOSIS — L89310 Pressure ulcer of right buttock, unstageable: Secondary | ICD-10-CM | POA: Diagnosis not present

## 2024-03-24 DIAGNOSIS — D6832 Hemorrhagic disorder due to extrinsic circulating anticoagulants: Secondary | ICD-10-CM | POA: Diagnosis present

## 2024-03-24 DIAGNOSIS — Z923 Personal history of irradiation: Secondary | ICD-10-CM | POA: Diagnosis not present

## 2024-03-24 DIAGNOSIS — Z743 Need for continuous supervision: Secondary | ICD-10-CM | POA: Diagnosis not present

## 2024-03-24 DIAGNOSIS — R7889 Finding of other specified substances, not normally found in blood: Secondary | ICD-10-CM | POA: Diagnosis not present

## 2024-03-24 DIAGNOSIS — Z87891 Personal history of nicotine dependence: Secondary | ICD-10-CM | POA: Diagnosis not present

## 2024-03-24 DIAGNOSIS — F329 Major depressive disorder, single episode, unspecified: Secondary | ICD-10-CM | POA: Diagnosis not present

## 2024-03-24 DIAGNOSIS — L89159 Pressure ulcer of sacral region, unspecified stage: Secondary | ICD-10-CM | POA: Diagnosis not present

## 2024-03-24 DIAGNOSIS — Z9221 Personal history of antineoplastic chemotherapy: Secondary | ICD-10-CM | POA: Diagnosis not present

## 2024-03-24 DIAGNOSIS — Z9861 Coronary angioplasty status: Secondary | ICD-10-CM | POA: Diagnosis not present

## 2024-03-24 DIAGNOSIS — Z7901 Long term (current) use of anticoagulants: Secondary | ICD-10-CM | POA: Diagnosis not present

## 2024-03-24 DIAGNOSIS — Z8522 Personal history of malignant neoplasm of nasal cavities, middle ear, and accessory sinuses: Secondary | ICD-10-CM | POA: Diagnosis not present

## 2024-03-25 ENCOUNTER — Other Ambulatory Visit: Payer: Self-pay

## 2024-03-30 DIAGNOSIS — R634 Abnormal weight loss: Secondary | ICD-10-CM | POA: Diagnosis not present

## 2024-03-30 DIAGNOSIS — L98429 Non-pressure chronic ulcer of back with unspecified severity: Secondary | ICD-10-CM | POA: Diagnosis not present

## 2024-03-30 DIAGNOSIS — Z681 Body mass index (BMI) 19 or less, adult: Secondary | ICD-10-CM | POA: Diagnosis not present

## 2024-03-30 DIAGNOSIS — K529 Noninfective gastroenteritis and colitis, unspecified: Secondary | ICD-10-CM | POA: Diagnosis not present

## 2024-03-30 DIAGNOSIS — M6259 Muscle wasting and atrophy, not elsewhere classified, multiple sites: Secondary | ICD-10-CM | POA: Diagnosis not present

## 2024-04-01 ENCOUNTER — Ambulatory Visit: Attending: Cardiovascular Disease | Admitting: Cardiovascular Disease

## 2024-04-01 ENCOUNTER — Encounter: Payer: Self-pay | Admitting: Cardiovascular Disease

## 2024-04-01 VITALS — BP 120/64 | HR 88 | Ht 65.0 in | Wt 111.6 lb

## 2024-04-01 DIAGNOSIS — Z951 Presence of aortocoronary bypass graft: Secondary | ICD-10-CM | POA: Insufficient documentation

## 2024-04-01 DIAGNOSIS — I10 Essential (primary) hypertension: Secondary | ICD-10-CM | POA: Diagnosis not present

## 2024-04-01 DIAGNOSIS — I6521 Occlusion and stenosis of right carotid artery: Secondary | ICD-10-CM | POA: Insufficient documentation

## 2024-04-01 DIAGNOSIS — I739 Peripheral vascular disease, unspecified: Secondary | ICD-10-CM | POA: Diagnosis not present

## 2024-04-01 DIAGNOSIS — E785 Hyperlipidemia, unspecified: Secondary | ICD-10-CM | POA: Diagnosis not present

## 2024-04-01 DIAGNOSIS — I4891 Unspecified atrial fibrillation: Secondary | ICD-10-CM | POA: Insufficient documentation

## 2024-04-01 NOTE — Patient Instructions (Signed)
 Medication Instructions:  Your physician recommends that you continue on your current medications as directed. Please refer to the Current Medication list given to you today.  *If you need a refill on your cardiac medications before your next appointment, please call your pharmacy*  Follow-Up: At [facility], you and your health needs are our priority.  As part of our continuing mission to provide you with exceptional heart care, our providers are all part of one team.  This team includes your primary Cardiologist (physician) and Advanced Practice Providers or APPs (Physician Assistants and Nurse Practitioners) who all work together to provide you with the care you need, when you need it.  Your next appointment:   6 month(s)  Provider:   Jackee Alberts, NP, Jon Hails, PA-C, Lum Louis, NP, Aline Door, PA-C, Kathleen Johnson, PA-C, Damien Braver, NP, or Katlyn West, NP         Then, Dorn Lesches, MD will plan to see you again in 12 month(s).     We recommend signing up for the patient portal called MyChart.  Sign up information is provided on this After Visit Summary.  MyChart is used to connect with patients for Virtual Visits (Telemedicine).  Patients are able to view lab/test results, encounter notes, upcoming appointments, etc.  Non-urgent messages can be sent to your provider as well.   To learn more about what you can do with MyChart, go to [email or web address].

## 2024-04-01 NOTE — Assessment & Plan Note (Signed)
 History of moderate right ICA stenosis by duplex ultrasound 11/27/2023.

## 2024-04-01 NOTE — Progress Notes (Signed)
 04/01/2024 [name]   1941/05/22  [id number]  Primary Physician Chrystal Lamarr RAMAN, MD Primary Cardiologist: Dorn JINNY Lesches MD [place] [place], [place]  HPI:  [name] is a 83 y.o.  mildly overweight widowed Caucasian female mother of 3, grandmother 7 grandchildren referred by Dr. Gershon, her podiatrist for critical limb ischemia.  I last saw her in the office 05/31/2023. She has a history of treated hypertension, diabetes and hyperlipidemia.  She is never had a heart attack or stroke.  She denies chest pain or shortness of breath.  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  She has some local trauma related to socks over Christmas and has developed ischemic appearing wounds on her right second and third toes with recent Dopplers performed 10/09/2018 revealing a right ABI 0.75, left 2.79 with moderately severe right SFA stenosis and an occluded right posterior tibial artery.  She is seeing Dr. Gershon back regularly for aggressive local wound care.    Her wounds continue to heal on her right and left second toes.  These sores are probably a result of local trauma.  Recent ABIs are in the 0.6  range with a high-grade right SFA stenosis and an occluded left popliteal with tibial vessel disease as well.  She does complain of some claudication.  She wishes to proceed with endovascular therapy for wound healing and lifestyle limiting claudication.   I performed angiography on her 06/15/2019 with a failed attempt at crossing the left SFA CTO.  She has one-vessel runoff bilaterally.  She did develop gangrenous right first and second toes.  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 Serene who performed right common femoral endarterectomy with patch angioplasty 07/21/2019 with excellent result.  She did see Dr. Gershon back in the office late last  week who thought her wounds were stable.  The pain in her feet has improved since her endarterectomy.  The plan is to perform orbital atherectomy and PTA of her calcified right SFA as well as intervention on her right tibioperoneal trunk prior to right TMA by Dr. Alona for limb salvage.   I performed orbital atherectomy followed by drug-coated balloon angioplasty of her right SFA, popliteal and tibioperoneal trunk on 08/03/2019 with excellent result.  Her follow-up Dopplers performed 08/14/2019 revealed a widely patent SFA and tibioperoneal trunk.  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   She has been seeing Dr. Rufus in the wound care clinic on a weekly basis.  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  She is on 2 IV antibiotics and continues to get hyperbaric oxygen .  Dr. Rufus does not feel these will heal without inline flow.  The patient is a candidate for tibial pedal access of the left dorsalis pedis.  I reviewed her anatomy with Dr. Darron who i performed tibial pedal intervention in March of this year resulting healing of her left foot wound. She did undergo right TMA by Dr. Gershon which healed nicely.   She was admitted with non-STEMI on 03/13/2020 and underwent cardiac catheterization by Dr. Swaziland revealing three-vessel disease. 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 Latisha 03/24/2020 with an excellent result and was discharged 1 week later. She is  recuperated nicely. She does have a small nonhealing wound and a vein harvest site in her right calf. She denies claudication. Recent Dopplers performed 06/08/2020 did reveal an occluded right SFA although she was asymptomatic from this.   Unfortunately she had developed breakdown of her right TMA healed wound now with a small open wound that has been draining.  She does get pain in  her leg when she walks.  Based on this I decided to proceed with urgent angiography potential endovascular therapy for limb salvage.  I angiogrammed her 08/01/2020 revealing an occluded right SFA from the origin to the adductor canal with one-vessel runoff via peroneal.  She did have an 80% tibioperoneal trunk stenosis and 80% restenosis in her prior common femoral endarterectomy site.  She underwent right common femoral endarterectomy with right femoral to above-the-knee popliteal bypass graft by Dr. Sheree.  She recuperated nicely.  Her TMA wound healed.  Her most recent Doppler studies performed 09/30/2020 revealed a widely patent graft.  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  She is lost 25 pounds since I saw her.  Since I saw her a year ago she was admitted recently with sepsis related to pressure ulcers.  She is currently getting outpatient antibiotics.  She briefly had A-fib during the hospitalization and her low-dose Xarelto  was increased to full dose.  She denies chest pain or shortness of breath.   Current Meds  Medication Sig   brimonidine  (ALPHAGAN ) 0.2 % ophthalmic solution Place 1 drop into the right eye 2 (two) times daily.   empagliflozin  (JARDIANCE ) 25 MG TABS tablet Take 1 tablet (25 mg total) by mouth daily before breakfast.   feeding supplement (ENSURE ENLIVE / ENSURE PLUS) LIQD Take 237 mLs by mouth 2 (two) times daily between meals.   Insulin  Lispro Prot & Lispro (HUMALOG  MIX 75/25 KWIKPEN) (75-25) 100 UNIT/ML Kwikpen INJECT 6 UNITS SUBCUTANEOUSLY ONCE DAILY WITH BREAKFAST   Insulin  Pen Needle (ULTRA-THIN II MINI PEN NEEDLE) 31G X 5 MM MISC Use as directed to check blood glucose   Lancets (ONETOUCH DELICA PLUS LANCET33G) MISC Use to check blood sugars twice daily. (Patient taking differently: Use to check blood sugars  daily.)   MAGnesium -Oxide 400 (240 Mg) MG tablet Take 1 tablet by mouth  twice daily   metFORMIN  (GLUCOPHAGE ) 1000 MG tablet Take 1 tablet (1,000 mg total) by mouth 2 (two) times daily.   metoprolol  tartrate (LOPRESSOR ) 25 MG tablet Take 1 tablet (25 mg total) by mouth 2 (two) times daily.   mirtazapine  (REMERON ) 7.5 MG tablet Take 1 tablet (7.5 mg total) by mouth at bedtime.   mupirocin  ointment (BACTROBAN ) 2 % Apply 1 Application topically 2 (two) times daily. (Patient taking differently: Apply 1 Application topically 2 (two) times daily as needed (wound care for toes).)   ONETOUCH VERIO test strip USE TO CHECK BLOOD SUGARS ONCE DAILY   polyethylene glycol (MIRALAX  / GLYCOLAX ) 17 g packet Take 17 g by mouth daily.   Propylene Glycol (SYSTANE BALANCE) 0.6 % SOLN Place 1 drop into the left eye 3 (three) times daily as needed (dry/irritated eyes).   repaglinide  (PRANDIN ) 1 MG tablet Take 1 tablet (1 mg total) by mouth 2 (two) times daily before a meal. Take 1 tab by breakfast and 1 tab before dinner   Rivaroxaban  (XARELTO ) 15 MG TABS tablet Take 1 tablet (15 mg total) by mouth daily with supper.   rosuvastatin  (CRESTOR ) 40  MG tablet Take 1 tablet (40 mg total) by mouth daily.   senna (SENOKOT) 8.6 MG TABS tablet Take 2 tablets (17.2 mg total) by mouth 2 (two) times daily.   traZODone  (DESYREL ) 50 MG tablet Take 1 tablet (50 mg total) by mouth at bedtime as needed for sleep.     Allergies  Allergen Reactions   Versed  [Midazolam ] Anxiety    Frantic, out of my mind, agitated    Augmentin [Amoxicillin-Pot Clavulanate] Other (See Comments)    Elevates potassium level   Bactrim  [Sulfamethoxazole -Trimethoprim ] Other (See Comments)    ^ K+( elevated)    Demerol [Meperidine]     Delusional    Meperidine Hcl     Other reaction(s): delusional   Scopolamine     Delusional   Patch *   Tramadol  Other (See Comments)    Keeps awake    Social History   Socioeconomic History   Marital status: Widowed    Spouse name: Not on file   Number of children: 3   Years of  education: Not on file   Highest education level: Not on file  Occupational History   Not on file  Tobacco Use   Smoking status: Former    Current packs/day: 0.00    Types: Cigarettes    Quit date: 03/31/1972    Years since quitting: 52.0   Smokeless tobacco: Never  Vaping Use   Vaping status: Never Used  Substance and Sexual Activity   Alcohol  use: No   Drug use: No   Sexual activity: Not on file  Other Topics Concern   Not on file  Social History Narrative   Not on file   Social Drivers of Health   Financial Resource Strain: Not on file  Food Insecurity: No Food Insecurity (02/13/2024)   Hunger Vital Sign    Worried About Running Out of Food in the Last Year: Never true    Ran Out of Food in the Last Year: Never true  Transportation Needs: No Transportation Needs (02/13/2024)   PRAPARE - Administrator, Civil Service (Medical): No    Lack of Transportation (Non-Medical): No  Physical Activity: Not on file  Stress: Not on file  Social Connections: Moderately Isolated (02/13/2024)   Social Connection and Isolation Panel    Frequency of Communication with Friends and Family: More than three times a week    Frequency of Social Gatherings with Friends and Family: Once a week    Attends Religious Services: Never    Database administrator or Organizations: Yes    Attends Banker Meetings: Never    Marital Status: Widowed  Intimate Partner Violence: Not At Risk (02/13/2024)   Humiliation, Afraid, Rape, and Kick questionnaire    Fear of Current or Ex-Partner: No    Emotionally Abused: No    Physically Abused: No    Sexually Abused: No     Review of Systems: General: negative for chills, fever, night sweats or weight changes.  Cardiovascular: negative for chest pain, dyspnea on exertion, edema, orthopnea, palpitations, paroxysmal nocturnal dyspnea or shortness of breath Dermatological: negative for rash Respiratory: negative for cough or  wheezing Urologic: negative for hematuria Abdominal: negative for nausea, vomiting, diarrhea, bright red blood per rectum, melena, or hematemesis Neurologic: negative for visual changes, syncope, or dizziness All other systems reviewed and are otherwise negative except as noted above.    Blood pressure 120/64, pulse 88, height 5' 5 (1.651 m), weight 111 lb 9.6 oz (  50.6 kg), SpO2 97%.  General appearance: alert and no distress Neck: no adenopathy, no carotid bruit, no JVD, supple, symmetrical, trachea midline, and thyroid  not enlarged, symmetric, no tenderness/mass/nodules Lungs: clear to auscultation bilaterally Heart: regular rate and rhythm, S1, S2 normal, no murmur, click, rub or gallop Extremities: extremities normal, atraumatic, no cyanosis or edema Pulses: Decreased pedal pulses Skin: Skin color, texture, turgor normal. No rashes or lesions Neurologic: Grossly normal  EKG not performed today      ASSESSMENT AND PLAN:   Essential hypertension History of essential hypertension with blood pressure measured today at 120/64.  She is on metoprolol .  Dyslipidemia History of dyslipidemia on high-dose statin therapy with lipid profile performed 09/23/2023 revealing total cholesterol 117, LDL 53 and HDL 42.  Carotid artery disease (HCC) History of moderate right ICA stenosis by duplex ultrasound 11/27/2023.  PAD (peripheral artery disease) (HCC) History of PAD status post peripheral angiography by myself 06/15/2019 with failed attempt at crossing left SFA CTO.  This was done because of critical limb ischemia.  She had one-vessel runoff bilaterally.  She had gangrenous right 1st and 2nd toe as well.  I referred her to Dr. Serene who performed right common femoral endarterectomy with patch angioplasty 07/21/2019 with an excellent result.  The pain in her feet improved since her endarterectomy.  I performed orbital atherectomy followed by drug-coated angioplasty of her right SFA and  popliteal as well as tibial peroneal trunk 08/03/2019 with excellent result.  She ended up having a TMA by Dr. Alona 08/07/2019 which almost completely healed with the assistance of hyperbaric oxygen .  Dr. Liberty performed to be a pedal access resulting in healing of her left foot wound.  She ultimately underwent angiography 08/01/2020 revealing an occluded right SFA from the origin to the adductor canal and ultimately underwent right common femoral endarterectomy with femoral to the above-the-knee popliteal bypass grafting by Dr. Sheree.  Her TMA healed.  Dr. Sheree continues to follow her from a peripheral vascular perspective.  S/P CABG x 5 History of CAD status post non-STEMI 03/13/2020 with cardiac catheterization performed by Dr. Swaziland revealing three-vessel disease.  She ultimately underwent CABG x 5 by Dr. Latisha 03/24/2020 with an excellent result.  She recuperated nicely.  She has had no further cardiovascular issues  Atrial fibrillation with [facility]) Patient recently had PAF during her hospitalization for sepsis which reverted to sinus rhythm.  She was on low-dose Xarelto  which was increased to full dose Xarelto .  Her last EKG showed sinus rhythm.     Dorn DOROTHA Lesches MD FACP,FACC,FAHA, [facility] 04/01/2024 2:27 PM

## 2024-04-01 NOTE — Assessment & Plan Note (Signed)
 History of essential hypertension with blood pressure measured today at 120/64.  She is on metoprolol .

## 2024-04-01 NOTE — Assessment & Plan Note (Signed)
 History of PAD status post peripheral angiography by myself 06/15/2019 with failed attempt at crossing left SFA CTO.  This was done because of critical limb ischemia.  She had one-vessel runoff bilaterally.  She had gangrenous right 1st and 2nd toe as well.  I referred her to Dr. Serene who performed right common femoral endarterectomy with patch angioplasty 07/21/2019 with an excellent result.  The pain in her feet improved since her endarterectomy.  I performed orbital atherectomy followed by drug-coated angioplasty of her right SFA and popliteal as well as tibial peroneal trunk 08/03/2019 with excellent result.  She ended up having a TMA by Dr. Alona 08/07/2019 which almost completely healed with the assistance of hyperbaric oxygen .  Dr. Liberty performed to be a pedal access resulting in healing of her left foot wound.  She ultimately underwent angiography 08/01/2020 revealing an occluded right SFA from the origin to the adductor canal and ultimately underwent right common femoral endarterectomy with femoral to the above-the-knee popliteal bypass grafting by Dr. Sheree.  Her TMA healed.  Dr. Sheree continues to follow her from a peripheral vascular perspective.

## 2024-04-01 NOTE — Assessment & Plan Note (Signed)
 History of CAD status post non-STEMI 03/13/2020 with cardiac catheterization performed by Dr. Swaziland revealing three-vessel disease.  She ultimately underwent CABG x 5 by Dr. Latisha 03/24/2020 with an excellent result.  She recuperated nicely.  She has had no further cardiovascular issues

## 2024-04-01 NOTE — Assessment & Plan Note (Signed)
 Patient recently had PAF during her hospitalization for sepsis which reverted to sinus rhythm.  She was on low-dose Xarelto  which was increased to full dose Xarelto .  Her last EKG showed sinus rhythm.

## 2024-04-01 NOTE — Assessment & Plan Note (Signed)
 History of dyslipidemia on high-dose statin therapy with lipid profile performed 09/23/2023 revealing total cholesterol 117, LDL 53 and HDL 42.

## 2024-04-03 ENCOUNTER — Other Ambulatory Visit: Payer: Self-pay

## 2024-04-03 NOTE — Progress Notes (Signed)
 Therapy complete, disenrolled.

## 2024-04-07 ENCOUNTER — Encounter: Payer: Self-pay | Admitting: Internal Medicine

## 2024-04-07 ENCOUNTER — Other Ambulatory Visit: Payer: Self-pay

## 2024-04-07 ENCOUNTER — Ambulatory Visit (INDEPENDENT_AMBULATORY_CARE_PROVIDER_SITE_OTHER): Admitting: Internal Medicine

## 2024-04-07 ENCOUNTER — Telehealth: Payer: Self-pay

## 2024-04-07 VITALS — BP 115/70 | HR 65 | Temp 97.7°F

## 2024-04-07 DIAGNOSIS — L98429 Non-pressure chronic ulcer of back with unspecified severity: Secondary | ICD-10-CM | POA: Diagnosis not present

## 2024-04-07 DIAGNOSIS — L89154 Pressure ulcer of sacral region, stage 4: Secondary | ICD-10-CM | POA: Diagnosis not present

## 2024-04-07 DIAGNOSIS — N1831 Chronic kidney disease, stage 3a: Secondary | ICD-10-CM

## 2024-04-07 DIAGNOSIS — E119 Type 2 diabetes mellitus without complications: Secondary | ICD-10-CM

## 2024-04-07 DIAGNOSIS — M4628 Osteomyelitis of vertebra, sacral and sacrococcygeal region: Secondary | ICD-10-CM

## 2024-04-07 NOTE — Telephone Encounter (Signed)
-----   Message from Nurse Enis S sent at 04/07/2024 11:59 AM EDT ----- Regarding: follow up with [facility] plan Follow up on weekly labs needed from snf [phone]

## 2024-04-07 NOTE — Telephone Encounter (Signed)
 Spoke with [place], Rn regarding lab orders. Was able to provide verbal to RN.  Labs: CBC, CMP, crp, and esr  Lorenda CHRISTELLA Code, RMA

## 2024-04-07 NOTE — Patient Instructions (Signed)
 Please send over actual culture with antibiotics susceptibility testing Please send weekly labs to us  as well   Stop vanc. Start doxycycline  100 mg po bid Continue meropenem    Stop meropenem /doxycycline  on 05/08/24   Follow up in 3 weeks

## 2024-04-07 NOTE — Progress Notes (Signed)
 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   Coronary artery disease involving native heart without angina pectoris 04/05/2023   Jaundice 04/04/2023   Cholelithiasis 10/12/2022   Symptomatic cholelithiasis 10/12/2022   Family history of malignant neoplasm of digestive organ 02/17/2022   Gangrenous disorder (HCC) 02/17/2022   Gastro-esophageal reflux disease without esophagitis 02/17/2022   Hyperglycemia due to type 2 diabetes mellitus (HCC) 02/17/2022   Iron  deficiency anemia 02/17/2022   Leukocytosis 02/17/2022   Moderate nonproliferative diabetic retinopathy of both eyes without macular edema associated with type 2 diabetes mellitus (HCC) 02/17/2022   Other acute osteomyelitis, left ankle and foot (HCC) 02/17/2022   Pure hypercholesterolemia 02/17/2022   Skin ulcer of sacrum, limited to breakdown of skin (HCC) 02/17/2022   Thrombocytopenic disorder (HCC) 02/17/2022   Tinnitus 02/17/2022   S/P angiogram of extremity 10/13/2021   Osteomyelitis of foot, left, acute (HCC) 04/27/2021   Cellulitis in diabetic foot (HCC) 04/27/2021   AKI (acute kidney injury) (HCC) 04/27/2021   Hyponatremia 04/27/2021   Symptomatic anemia 04/27/2021   Cellulitis of left lower extremity    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 Had what appeared to be assymptomatic bacteriuria kleb pna as well. Intolerance to augmentin and finished 5 day ceftriaxone . Discharged to rehab due to weakness  She said she has had the sacral ulcer for the past few months, present before 02/12/24 admission. Appears it wasn't evaluated or worsened until snf admission  I reviewed also outside record: Dr steven Magilen  from 02/25/24 Sacral ulcer extends to right buttock  From 03/03/24 Sacrum adherent dark gray/yellow slough connected by skin breakdown that has flat pink smooth granular tissue. Moderate serous drainage. No surrounding erythema/induration there is tunneling. No mal odor. Debridement bedside done.  Wound care --> medihoney, calcium  alginate, dry protective dressing daily change.  6/10 xray no OM or fracture 6/11 esr 32; crp 8.8; bun cr 49/1.3 03/10/24  started on doxycycline . ?fever to 101 noted 6/19 wound cx appears to be a pcr based (cfu/ml units given)? Ecoli, prteus mirabilis, providencia stuartii,  staph aureus, staph epi. No actual reports available or culture sensitivity 03/19/24 mri sacrum:    Snf mar indicates she was started on vanc/meropenem  7/5. Has weekly labs. 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 There is a small calcified mass in the L:A near the LA appendage. No valvular masses seen with associated mitral annular calcification. LA Volume/ BSA27.4 ml/m2 No AS. Right ventricular systolic pressure is elevated at .   Jaundice    Myocardial infarction [facility])    June 2021   Neuromuscular disorder (HCC)    neuropathy in bilateral feet   Peripheral vascular disease (HCC)    Pneumonia    not hosp.    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  PTCAs with drug coated balloon Lt ant tibial artery and Lt popliteal artery  11/26/2019    Social History   Tobacco Use   Smoking status: Former    Current packs/day: 0.00    Types: Cigarettes    Quit date: 03/31/1972    Years since quitting: 52.0   Smokeless tobacco: Never  Vaping Use   Vaping status: Never Used  Substance Use Topics   Alcohol  use: No   Drug use: No    Family History  Problem Relation Age of Onset    Cancer - Prostate Father    Cancer - Colon Father    Stroke Mother    Hypertension Mother    Hyperlipidemia Mother    Melanoma Brother     Allergies  Allergen Reactions   Versed  [Midazolam ] Anxiety    Frantic, out of my mind, agitated    Augmentin [Amoxicillin-Pot Clavulanate] Other (See Comments)    Elevates potassium level   Bactrim  [Sulfamethoxazole -Trimethoprim ] Other (See Comments)    ^ K+( elevated)    Demerol [Meperidine]     Delusional    Meperidine Hcl     Other reaction(s): delusional   Scopolamine     Delusional   Patch *   Tramadol  Other (See Comments)    Keeps awake    OBJECTIVE: There were no vitals filed for this visit. There is no height or weight on file to calculate BMI.   Physical Exam General/constitutional: no distress, pleasant HEENT: Normocephalic, PER, Conj Clear, EOMI, Oropharynx clear Neck supple CV: rrr no mrg Lungs: clear to auscultation, normal respiratory effort Abd: Soft, Nontender Ext: no edema Skin: No Rash -- didn't examine sacral process Neuro:  nonfocal MSK: no peripheral joint swelling/tenderness/warmth; back spines nontender   Central line presence: rue picc site no purulence  Lab: Lab Results  Component Value Date   WBC 13.3 (H) 03/20/2024   HGB 8.8 (L) 03/20/2024   HCT 26.5 (L) 03/20/2024   MCV 93.3 03/20/2024   PLT 268 03/20/2024   Last metabolic panel Lab Results  Component Value Date   GLUCOSE 113 (H) 03/20/2024   NA 140 03/20/2024   K 2.4 (LL) 03/20/2024   CL 99 03/20/2024   CO2 30 03/20/2024   BUN 61 (H) 03/20/2024   CREATININE 1.05 (H) 03/20/2024   GFRNONAA 53 (L) 03/20/2024   CALCIUM  9.5 03/20/2024   PROT 5.4 (L) 03/20/2024   ALBUMIN  2.5 (L) 03/20/2024   BILITOT 0.5 03/20/2024   ALKPHOS 207 (H) 03/20/2024   AST 27 03/20/2024   ALT 17 03/20/2024   ANIONGAP 11 03/20/2024     Microbiology:  Serology:  Imaging:   Assessment/plan: Problem List Items Addressed This Visit     DM2  (diabetes mellitus, type 2) (HCC)   Other Visit Diagnoses       Skin ulcer of sacrum, unspecified ulcer stage (HCC)    -  Primary     Sacral osteomyelitis (HCC)         Stage 3a chronic kidney disease (HCC)             Started on om tx course for her sacral ulcer and mri bone edema ?om On vanc/meropenem   Cx as above mrsa, providencia -- I dont have actual sensitivity data   Given dm2, ckd, pad, I am changing vanc to oral doxy. Can continue meropenem  End date 05/08/24  Continue wound care  Needs weekly labs from snf to be sent to us  Needs also actual culture and antibiotics sensitivity data to be sent  Advise patient to bring pictures next visit in 3-4 weeks   ------------- 04/09/24 addendum Received lab from [facility] 6/19 culture --> indeed a pcr panel See media tab E cloacae Staph epi (msse?); mssa Ecoli no esbl Providencia stuarti Proteus mirabilis     Follow-up: Return in about 3 weeks (around 04/28/2024).  Constance ONEIDA Passer, MD [facility] 04/07/2024, 10:15 AM

## 2024-04-08 ENCOUNTER — Other Ambulatory Visit: Payer: Self-pay

## 2024-04-09 DIAGNOSIS — D6859 Other primary thrombophilia: Secondary | ICD-10-CM | POA: Diagnosis not present

## 2024-04-09 DIAGNOSIS — E1122 Type 2 diabetes mellitus with diabetic chronic kidney disease: Secondary | ICD-10-CM | POA: Diagnosis not present

## 2024-04-09 DIAGNOSIS — C23 Malignant neoplasm of gallbladder: Secondary | ICD-10-CM | POA: Diagnosis not present

## 2024-04-09 DIAGNOSIS — N183 Chronic kidney disease, stage 3 unspecified: Secondary | ICD-10-CM | POA: Diagnosis not present

## 2024-04-09 DIAGNOSIS — Z681 Body mass index (BMI) 19 or less, adult: Secondary | ICD-10-CM | POA: Diagnosis not present

## 2024-04-09 DIAGNOSIS — I480 Paroxysmal atrial fibrillation: Secondary | ICD-10-CM | POA: Diagnosis not present

## 2024-04-09 DIAGNOSIS — G4700 Insomnia, unspecified: Secondary | ICD-10-CM | POA: Diagnosis not present

## 2024-04-09 DIAGNOSIS — K529 Noninfective gastroenteritis and colitis, unspecified: Secondary | ICD-10-CM | POA: Diagnosis not present

## 2024-04-09 DIAGNOSIS — L89150 Pressure ulcer of sacral region, unstageable: Secondary | ICD-10-CM | POA: Diagnosis not present

## 2024-04-09 DIAGNOSIS — E1169 Type 2 diabetes mellitus with other specified complication: Secondary | ICD-10-CM | POA: Diagnosis not present

## 2024-04-09 DIAGNOSIS — F329 Major depressive disorder, single episode, unspecified: Secondary | ICD-10-CM | POA: Diagnosis not present

## 2024-04-09 DIAGNOSIS — M4628 Osteomyelitis of vertebra, sacral and sacrococcygeal region: Secondary | ICD-10-CM | POA: Diagnosis not present

## 2024-04-13 DIAGNOSIS — L89150 Pressure ulcer of sacral region, unstageable: Secondary | ICD-10-CM | POA: Diagnosis not present

## 2024-04-13 DIAGNOSIS — D631 Anemia in chronic kidney disease: Secondary | ICD-10-CM | POA: Diagnosis not present

## 2024-04-13 DIAGNOSIS — K529 Noninfective gastroenteritis and colitis, unspecified: Secondary | ICD-10-CM | POA: Diagnosis not present

## 2024-04-13 DIAGNOSIS — M4628 Osteomyelitis of vertebra, sacral and sacrococcygeal region: Secondary | ICD-10-CM | POA: Diagnosis not present

## 2024-04-13 DIAGNOSIS — N183 Chronic kidney disease, stage 3 unspecified: Secondary | ICD-10-CM | POA: Diagnosis not present

## 2024-04-14 ENCOUNTER — Inpatient Hospital Stay (HOSPITAL_COMMUNITY)
Admission: EM | Admit: 2024-04-14 | Discharge: 2024-04-16 | DRG: 811 | Disposition: A | Discharge status: Skilled Nursing Facility | Source: Ambulatory Visit | Attending: Internal Medicine | Admitting: Internal Medicine

## 2024-04-14 ENCOUNTER — Encounter (HOSPITAL_COMMUNITY): Payer: Self-pay | Admitting: Internal Medicine

## 2024-04-14 ENCOUNTER — Other Ambulatory Visit: Payer: Self-pay

## 2024-04-14 DIAGNOSIS — C221 Intrahepatic bile duct carcinoma: Secondary | ICD-10-CM | POA: Diagnosis present

## 2024-04-14 DIAGNOSIS — Z83438 Family history of other disorder of lipoprotein metabolism and other lipidemia: Secondary | ICD-10-CM | POA: Diagnosis not present

## 2024-04-14 DIAGNOSIS — Z800 Family history of malignant neoplasm of digestive organs: Secondary | ICD-10-CM

## 2024-04-14 DIAGNOSIS — E1151 Type 2 diabetes mellitus with diabetic peripheral angiopathy without gangrene: Secondary | ICD-10-CM | POA: Diagnosis not present

## 2024-04-14 DIAGNOSIS — C23 Malignant neoplasm of gallbladder: Secondary | ICD-10-CM | POA: Diagnosis present

## 2024-04-14 DIAGNOSIS — R195 Other fecal abnormalities: Secondary | ICD-10-CM | POA: Diagnosis not present

## 2024-04-14 DIAGNOSIS — M6281 Muscle weakness (generalized): Secondary | ICD-10-CM | POA: Diagnosis not present

## 2024-04-14 DIAGNOSIS — R531 Weakness: Secondary | ICD-10-CM | POA: Diagnosis not present

## 2024-04-14 DIAGNOSIS — N1831 Chronic kidney disease, stage 3a: Secondary | ICD-10-CM | POA: Diagnosis present

## 2024-04-14 DIAGNOSIS — Z881 Allergy status to other antibiotic agents status: Secondary | ICD-10-CM

## 2024-04-14 DIAGNOSIS — I739 Peripheral vascular disease, unspecified: Secondary | ICD-10-CM | POA: Diagnosis present

## 2024-04-14 DIAGNOSIS — Z9221 Personal history of antineoplastic chemotherapy: Secondary | ICD-10-CM

## 2024-04-14 DIAGNOSIS — Z87891 Personal history of nicotine dependence: Secondary | ICD-10-CM

## 2024-04-14 DIAGNOSIS — R197 Diarrhea, unspecified: Secondary | ICD-10-CM | POA: Insufficient documentation

## 2024-04-14 DIAGNOSIS — Z951 Presence of aortocoronary bypass graft: Secondary | ICD-10-CM | POA: Diagnosis not present

## 2024-04-14 DIAGNOSIS — K219 Gastro-esophageal reflux disease without esophagitis: Secondary | ICD-10-CM | POA: Diagnosis present

## 2024-04-14 DIAGNOSIS — Z888 Allergy status to other drugs, medicaments and biological substances status: Secondary | ICD-10-CM

## 2024-04-14 DIAGNOSIS — I480 Paroxysmal atrial fibrillation: Secondary | ICD-10-CM | POA: Diagnosis present

## 2024-04-14 DIAGNOSIS — Z8249 Family history of ischemic heart disease and other diseases of the circulatory system: Secondary | ICD-10-CM | POA: Diagnosis not present

## 2024-04-14 DIAGNOSIS — Z794 Long term (current) use of insulin: Secondary | ICD-10-CM | POA: Diagnosis not present

## 2024-04-14 DIAGNOSIS — L89150 Pressure ulcer of sacral region, unstageable: Secondary | ICD-10-CM | POA: Diagnosis not present

## 2024-04-14 DIAGNOSIS — D649 Anemia, unspecified: Principal | ICD-10-CM | POA: Diagnosis present

## 2024-04-14 DIAGNOSIS — E1169 Type 2 diabetes mellitus with other specified complication: Secondary | ICD-10-CM | POA: Diagnosis present

## 2024-04-14 DIAGNOSIS — I2510 Atherosclerotic heart disease of native coronary artery without angina pectoris: Secondary | ICD-10-CM | POA: Diagnosis present

## 2024-04-14 DIAGNOSIS — S31000A Unspecified open wound of lower back and pelvis without penetration into retroperitoneum, initial encounter: Secondary | ICD-10-CM | POA: Insufficient documentation

## 2024-04-14 DIAGNOSIS — D509 Iron deficiency anemia, unspecified: Secondary | ICD-10-CM | POA: Diagnosis not present

## 2024-04-14 DIAGNOSIS — Z923 Personal history of irradiation: Secondary | ICD-10-CM

## 2024-04-14 DIAGNOSIS — E1165 Type 2 diabetes mellitus with hyperglycemia: Secondary | ICD-10-CM | POA: Diagnosis not present

## 2024-04-14 DIAGNOSIS — Z7901 Long term (current) use of anticoagulants: Secondary | ICD-10-CM

## 2024-04-14 DIAGNOSIS — E113393 Type 2 diabetes mellitus with moderate nonproliferative diabetic retinopathy without macular edema, bilateral: Secondary | ICD-10-CM | POA: Diagnosis not present

## 2024-04-14 DIAGNOSIS — Z89422 Acquired absence of other left toe(s): Secondary | ICD-10-CM

## 2024-04-14 DIAGNOSIS — Z7401 Bed confinement status: Secondary | ICD-10-CM | POA: Diagnosis not present

## 2024-04-14 DIAGNOSIS — K644 Residual hemorrhoidal skin tags: Secondary | ICD-10-CM | POA: Diagnosis present

## 2024-04-14 DIAGNOSIS — Z79899 Other long term (current) drug therapy: Secondary | ICD-10-CM

## 2024-04-14 DIAGNOSIS — L89154 Pressure ulcer of sacral region, stage 4: Secondary | ICD-10-CM | POA: Diagnosis present

## 2024-04-14 DIAGNOSIS — E1122 Type 2 diabetes mellitus with diabetic chronic kidney disease: Secondary | ICD-10-CM | POA: Diagnosis present

## 2024-04-14 DIAGNOSIS — I959 Hypotension, unspecified: Secondary | ICD-10-CM | POA: Diagnosis not present

## 2024-04-14 DIAGNOSIS — I252 Old myocardial infarction: Secondary | ICD-10-CM | POA: Diagnosis not present

## 2024-04-14 DIAGNOSIS — Z7984 Long term (current) use of oral hypoglycemic drugs: Secondary | ICD-10-CM | POA: Diagnosis not present

## 2024-04-14 DIAGNOSIS — E119 Type 2 diabetes mellitus without complications: Secondary | ICD-10-CM

## 2024-04-14 DIAGNOSIS — Z885 Allergy status to narcotic agent status: Secondary | ICD-10-CM

## 2024-04-14 DIAGNOSIS — M6259 Muscle wasting and atrophy, not elsewhere classified, multiple sites: Secondary | ICD-10-CM | POA: Diagnosis not present

## 2024-04-14 DIAGNOSIS — Z823 Family history of stroke: Secondary | ICD-10-CM

## 2024-04-14 DIAGNOSIS — E785 Hyperlipidemia, unspecified: Secondary | ICD-10-CM | POA: Diagnosis present

## 2024-04-14 DIAGNOSIS — J189 Pneumonia, unspecified organism: Secondary | ICD-10-CM | POA: Diagnosis not present

## 2024-04-14 DIAGNOSIS — D638 Anemia in other chronic diseases classified elsewhere: Secondary | ICD-10-CM | POA: Diagnosis not present

## 2024-04-14 DIAGNOSIS — D8481 Immunodeficiency due to conditions classified elsewhere: Secondary | ICD-10-CM | POA: Diagnosis not present

## 2024-04-14 DIAGNOSIS — M4628 Osteomyelitis of vertebra, sacral and sacrococcygeal region: Secondary | ICD-10-CM

## 2024-04-14 DIAGNOSIS — I129 Hypertensive chronic kidney disease with stage 1 through stage 4 chronic kidney disease, or unspecified chronic kidney disease: Secondary | ICD-10-CM | POA: Diagnosis not present

## 2024-04-14 DIAGNOSIS — Z808 Family history of malignant neoplasm of other organs or systems: Secondary | ICD-10-CM

## 2024-04-14 HISTORY — DX: Anemia, unspecified: D64.9

## 2024-04-14 HISTORY — DX: Chronic kidney disease, stage 3a: N18.31

## 2024-04-14 LAB — RETICULOCYTES
Immature Retic Fract: 34.5 % — ABNORMAL HIGH (ref 2.3–15.9)
RBC.: 2.05 MIL/uL — ABNORMAL LOW (ref 3.87–5.11)
Retic Count, Absolute: 43.5 K/uL (ref 19.0–186.0)
Retic Ct Pct: 2.1 % (ref 0.4–3.1)

## 2024-04-14 LAB — CBC WITH DIFFERENTIAL/PLATELET
Abs Immature Granulocytes: 0.04 K/uL (ref 0.00–0.07)
Basophils Absolute: 0.1 K/uL (ref 0.0–0.1)
Basophils Relative: 1 %
Eosinophils Absolute: 0.1 K/uL (ref 0.0–0.5)
Eosinophils Relative: 1 %
HCT: 19.3 % — ABNORMAL LOW (ref 36.0–46.0)
Hemoglobin: 6 g/dL — CL (ref 12.0–15.0)
Immature Granulocytes: 0 %
Lymphocytes Relative: 6 %
Lymphs Abs: 0.6 K/uL — ABNORMAL LOW (ref 0.7–4.0)
MCH: 28.8 pg (ref 26.0–34.0)
MCHC: 31.1 g/dL (ref 30.0–36.0)
MCV: 92.8 fL (ref 80.0–100.0)
Monocytes Absolute: 1.4 K/uL — ABNORMAL HIGH (ref 0.1–1.0)
Monocytes Relative: 13 %
Neutro Abs: 8.8 K/uL — ABNORMAL HIGH (ref 1.7–7.7)
Neutrophils Relative %: 79 %
Platelets: 217 K/uL (ref 150–400)
RBC: 2.08 MIL/uL — ABNORMAL LOW (ref 3.87–5.11)
RDW: 16.9 % — ABNORMAL HIGH (ref 11.5–15.5)
WBC: 11 K/uL — ABNORMAL HIGH (ref 4.0–10.5)
nRBC: 0 % (ref 0.0–0.2)

## 2024-04-14 LAB — COMPREHENSIVE METABOLIC PANEL WITH GFR
ALT: 21 U/L (ref 0–44)
AST: 34 U/L (ref 15–41)
Albumin: 1.9 g/dL — ABNORMAL LOW (ref 3.5–5.0)
Alkaline Phosphatase: 135 U/L — ABNORMAL HIGH (ref 38–126)
Anion gap: 8 (ref 5–15)
BUN: 27 mg/dL — ABNORMAL HIGH (ref 8–23)
CO2: 22 mmol/L (ref 22–32)
Calcium: 8.5 mg/dL — ABNORMAL LOW (ref 8.9–10.3)
Chloride: 105 mmol/L (ref 98–111)
Creatinine, Ser: 1.03 mg/dL — ABNORMAL HIGH (ref 0.44–1.00)
GFR, Estimated: 54 mL/min — ABNORMAL LOW (ref >60)
Glucose, Bld: 86 mg/dL (ref 70–99)
Potassium: 4.3 mmol/L (ref 3.5–5.1)
Sodium: 135 mmol/L (ref 135–145)
Total Bilirubin: 0.6 mg/dL (ref 0.0–1.2)
Total Protein: 4.9 g/dL — ABNORMAL LOW (ref 6.5–8.1)

## 2024-04-14 LAB — PREPARE RBC (CROSSMATCH)

## 2024-04-14 LAB — GLUCOSE, CAPILLARY
Glucose-Capillary: 44 mg/dL — CL (ref 70–99)
Glucose-Capillary: 47 mg/dL — ABNORMAL LOW (ref 70–99)
Glucose-Capillary: 54 mg/dL — ABNORMAL LOW (ref 70–99)

## 2024-04-14 LAB — FERRITIN: Ferritin: 25 ng/mL (ref 11–307)

## 2024-04-14 LAB — PROTIME-INR
INR: 3.4 — ABNORMAL HIGH (ref 0.8–1.2)
Prothrombin Time: 35.7 s — ABNORMAL HIGH (ref 11.4–15.2)

## 2024-04-14 LAB — IRON AND TIBC
Iron: 17 ug/dL — ABNORMAL LOW (ref 28–170)
Saturation Ratios: 6 % — ABNORMAL LOW (ref 10.4–31.8)
TIBC: 308 ug/dL (ref 250–450)
UIBC: 291 ug/dL

## 2024-04-14 LAB — VITAMIN B12: Vitamin B-12: 549 pg/mL (ref 180–914)

## 2024-04-14 LAB — POC OCCULT BLOOD, ED: Fecal Occult Bld: POSITIVE — AB

## 2024-04-14 LAB — FOLATE: Folate: 8.4 ng/mL (ref >5.9)

## 2024-04-14 MED ORDER — SODIUM CHLORIDE 0.9% IV SOLUTION: Freq: Once | INTRAVENOUS | Status: AC

## 2024-04-14 MED ORDER — SODIUM CHLORIDE 0.9% FLUSH
3.0000 mL | Freq: Two times a day (BID) | INTRAVENOUS | Status: DC
  Administered 2024-04-14 – 2024-04-16 (×5): 3 mL via INTRAVENOUS

## 2024-04-14 MED ORDER — DOXYCYCLINE HYCLATE 100 MG PO TABS
100.0000 mg | ORAL_TABLET | Freq: Two times a day (BID) | ORAL | Status: DC
  Administered 2024-04-15 – 2024-04-16 (×4): 100 mg via ORAL
  Filled 2024-04-14 (×5): qty 1

## 2024-04-14 MED ORDER — MIRTAZAPINE 7.5 MG PO TABS
15.0000 mg | ORAL_TABLET | Freq: Every day | ORAL | Status: DC
  Administered 2024-04-15 – 2024-04-16 (×2): 15 mg via ORAL
  Filled 2024-04-14 (×2): qty 2

## 2024-04-14 MED ORDER — ONDANSETRON HCL 4 MG PO TABS: 4.0000 mg | ORAL_TABLET | Freq: Four times a day (QID) | ORAL | Status: DC | PRN

## 2024-04-14 MED ORDER — ONDANSETRON HCL 4 MG/2ML IJ SOLN
4.0000 mg | Freq: Four times a day (QID) | INTRAMUSCULAR | Status: DC | PRN
Start: 2024-04-14 — End: 2024-04-17

## 2024-04-14 MED ORDER — METOPROLOL TARTRATE 25 MG PO TABS
25.0000 mg | ORAL_TABLET | Freq: Two times a day (BID) | ORAL | Status: DC
  Administered 2024-04-14 – 2024-04-16 (×5): 25 mg via ORAL
  Filled 2024-04-14 (×5): qty 1

## 2024-04-14 MED ORDER — BRIMONIDINE TARTRATE 0.2 % OP SOLN
1.0000 [drp] | Freq: Two times a day (BID) | OPHTHALMIC | Status: DC
  Administered 2024-04-14 – 2024-04-16 (×6): 1 [drp] via OPHTHALMIC
  Filled 2024-04-14: qty 5

## 2024-04-14 MED ORDER — SODIUM CHLORIDE 0.9 % IV SOLN
1.0000 g | Freq: Two times a day (BID) | INTRAVENOUS | Status: DC
  Administered 2024-04-15 – 2024-04-16 (×5): 1 g via INTRAVENOUS
  Filled 2024-04-14 (×5): qty 20

## 2024-04-14 MED ORDER — TRAZODONE HCL 50 MG PO TABS
50.0000 mg | ORAL_TABLET | Freq: Every evening | ORAL | Status: DC | PRN
  Administered 2024-04-15: 50 mg via ORAL
  Filled 2024-04-14: qty 1

## 2024-04-14 MED ORDER — ENSURE PLUS HIGH PROTEIN PO LIQD
237.0000 mL | Freq: Two times a day (BID) | ORAL | Status: DC
  Administered 2024-04-15 – 2024-04-16 (×4): 237 mL via ORAL

## 2024-04-14 MED ORDER — POLYETHYLENE GLYCOL 3350 17 G PO PACK
17.0000 g | PACK | Freq: Every day | ORAL | Status: DC
  Administered 2024-04-16: 17 g via ORAL
  Filled 2024-04-14 (×2): qty 1

## 2024-04-14 MED ORDER — ACETAMINOPHEN 325 MG PO TABS: 650.0000 mg | ORAL_TABLET | Freq: Four times a day (QID) | ORAL | Status: DC | PRN

## 2024-04-14 MED ORDER — POLYVINYL ALCOHOL 1.4 % OP SOLN: 1.0000 [drp] | Freq: Three times a day (TID) | OPHTHALMIC | Status: DC | PRN

## 2024-04-14 MED ORDER — INSULIN ASPART 100 UNIT/ML IJ SOLN
0.0000 [IU] | Freq: Three times a day (TID) | INTRAMUSCULAR | Status: DC
  Administered 2024-04-15 – 2024-04-16 (×3): 1 [IU] via SUBCUTANEOUS
  Filled 2024-04-14: qty 0.09

## 2024-04-14 MED ORDER — ACETAMINOPHEN 650 MG RE SUPP: 650.0000 mg | Freq: Four times a day (QID) | RECTAL | Status: DC | PRN

## 2024-04-14 MED ORDER — SENNOSIDES-DOCUSATE SODIUM 8.6-50 MG PO TABS: 1.0000 | ORAL_TABLET | Freq: Every evening | ORAL | Status: DC | PRN

## 2024-04-14 MED ORDER — ROSUVASTATIN CALCIUM 20 MG PO TABS
40.0000 mg | ORAL_TABLET | Freq: Every day | ORAL | Status: DC
  Administered 2024-04-15 – 2024-04-16 (×2): 40 mg via ORAL
  Filled 2024-04-14 (×2): qty 2

## 2024-04-14 NOTE — ED Provider Notes (Signed)
 Emergency Department Provider Note   I have reviewed the triage vital signs and the nursing notes.   HISTORY  Chief Complaint Abnormal Lab   HPI [name] is a 83 y.o. female with past history of diabetes, cholangiocarcinoma, sacral ulcers on antibiotics presents to the emergency department with fatigue/generalized weakness and downtrending hemoglobin.  She is currently at a rehab center after recent discharge from the hospital.  She is getting IV antibiotics through a PICC line and also has a port.  Her last chemotherapy was in May of this year.  She denies abdominal pain, chest pain, fevers.  She has noticed increasing fatigue and labs at her facility have showed downtrending hemoglobin now below 7.  She has noticed some specks of black in the stool but no sticky/tarry stool.  No bright red blood per rectum.  No hematemesis.  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 There is a small calcified mass in the L:A near the LA appendage. No valvular masses seen with associated mitral annular calcification. LA Volume/ BSA27.4 ml/m2 No AS. Right ventricular systolic pressure is elevated at .   Jaundice    Myocardial infarction [facility])    June 2021   Neuromuscular disorder (HCC)    neuropathy in bilateral feet   Peripheral vascular disease (HCC)    Pneumonia    not hosp.    S/P angioplasty with stent Lt SFA of  prox segment.  PTCAs with drug coated balloon Lt ant tibial artery and Lt popliteal artery  11/26/2019    Review of Systems  Constitutional: No fever/chills. Positive generalized weakness.  Cardiovascular: Denies chest pain. Respiratory: Denies shortness of breath. Gastrointestinal: No abdominal pain.  No nausea, no vomiting.  Skin: Negative for rash. Neurological: Negative for headaches, focal weakness or numbness.   ____________________________________________   PHYSICAL EXAM:  VITAL SIGNS: ED Triage Vitals  Encounter Vitals Group     BP 04/14/24 1502 (!) 147/57     Pulse Rate 04/14/24 1502 74     Resp 04/14/24 1502 16     Temp 04/14/24 1502 (!) 97.5 F (36.4 C)     Temp Source 04/14/24 1502 Oral     SpO2 04/14/24 1502 100 %     Weight 04/14/24 1459 110 lb (49.9 kg)     Height 04/14/24 1459 5' 5 (1.651 m)    Constitutional: Alert and oriented. Well appearing and in no acute distress. Eyes: Conjunctivae are normal.  Head: Atraumatic. Nose: No congestion/rhinnorhea. Mouth/Throat: Mucous membranes are moist.   Neck: No stridor.   Cardiovascular: Normal rate, regular rhythm. Good peripheral circulation. Grossly normal heart sounds.   Respiratory: Normal respiratory effort.  No retractions. Lungs CTAB. Gastrointestinal: Soft and nontender. No distention.  Rectal exam performed with nurse chaperone at bedside.  No gross blood or melena.  External  hemorrhoids noted without active bleeding. Musculoskeletal: No lower extremity tenderness nor edema. No gross deformities of extremities.  Well-appearing PICC line site in the right upper extremity. Neurologic:  Normal speech and language. No gross focal neurologic deficits are appreciated.  Skin:  Skin is warm, dry.   ____________________________________________   LABS (all labs ordered are listed, but only abnormal results are displayed)  Labs Reviewed  COMPREHENSIVE METABOLIC PANEL WITH GFR - Abnormal; Notable for the  following components:      Result Value   BUN 27 (*)    Creatinine, Ser 1.03 (*)    Calcium  8.5 (*)    Total Protein 4.9 (*)    Albumin  1.9 (*)    Alkaline Phosphatase 135 (*)    GFR, Estimated 54 (*)    All other components within normal limits  CBC WITH DIFFERENTIAL/PLATELET - Abnormal; Notable for the following components:   WBC 11.0 (*)    RBC 2.08 (*)    Hemoglobin 6.0 (*)    HCT 19.3 (*)    RDW 16.9 (*)    Neutro Abs 8.8 (*)    Lymphs Abs 0.6 (*)    Monocytes Absolute 1.4 (*)    All other components within normal limits  PROTIME-INR - Abnormal; Notable for the following components:   Prothrombin Time 35.7 (*)    INR 3.4 (*)    All other components within normal limits  IRON  AND TIBC - Abnormal; Notable for the following components:   Iron  17 (*)    Saturation Ratios 6 (*)    All other components within normal limits  RETICULOCYTES - Abnormal; Notable for the following components:   RBC. 2.05 (*)    Immature Retic Fract 34.5 (*)    All other components within normal limits  POC OCCULT BLOOD, ED - Abnormal; Notable for the following components:   Fecal Occult Bld POSITIVE (*)    All other components within normal limits  VITAMIN B12  FOLATE  FERRITIN  URINALYSIS, W/ REFLEX TO CULTURE (INFECTION SUSPECTED)  TYPE AND SCREEN  PREPARE RBC (CROSSMATCH)   ____________________________________________  EKG  NSR. No STEMI.   ____________________________________________   PROCEDURES  Procedure(s) performed:   Procedures  CRITICAL CARE Performed by: Fonda KANDICE Law Total critical care time: 35 minutes Critical care time was exclusive of separately billable procedures and treating other patients. Critical care was necessary to treat or prevent imminent or life-threatening deterioration. Critical care was time spent personally by me on the following activities: development of treatment plan with patient and/or surrogate as well as nursing, discussions with  consultants, evaluation of patient's response to treatment, examination of patient, obtaining history from patient or surrogate, ordering and performing treatments and interventions, ordering and review of laboratory studies, ordering and review of radiographic studies, pulse oximetry and re-evaluation of patient's condition.  Fonda Law, MD Emergency Medicine  ____________________________________________   INITIAL IMPRESSION / ASSESSMENT AND PLAN / ED COURSE  Pertinent labs & imaging results that were available during my care of the patient were reviewed by me and considered in my medical decision making (see chart for details).   This patient is Presenting for Evaluation of weakness/anemia, which does require a range of treatment options, and is a complaint that involves a high risk of morbidity and mortality.  The Differential Diagnoses include symptomatic anemia, GI bleeding, kidney injury, sepsis, AKI, etc.  Critical Interventions-    Medications  0.9 %  sodium chloride  infusion (Manually program via Guardrails IV Fluids) ( Intravenous  New Bag/Given 04/14/24 1944)    Reassessment after intervention:  symptoms improved.   I decided to review pertinent External Data, and in summary patient with paper labs at bedside showing Hb of 6.5.   Clinical Laboratory Tests Ordered, included hemoglobin of 6.0.  No thrombocytopenia.  No acute kidney injury.  Cardiac Monitor Tracing which shows NSR.    Social Determinants of Health Risk patient is a non-smoker.   Consult complete with [facility]. Dr. Tobie. Plan for admit.   Medical Decision Making: Summary:  Patient presents emergency department for evaluation of generalized weakness.  Found to be increasingly anemic.  She has multiple risk factors for anemia.  No SIRS vitals.  Doubt sepsis.  No description of black/tarry stools to suspect melena.  Plan for repeat labs here and reassess.  Patient may require PRBC transfusion.  Reevaluation with  update and discussion with patient.  Her hemoglobin is 6.0.  We discussed blood transfusion.  She confirmed that she would want this and I have ordered 2 units.  We discussed the risks and benefits of this.  In epic, there is some prior documentation of refusing certain blood products.  I went back to confirm with patient and she advises that there is no limitation to blood products that she can receive and she is willing to accept PRBCs at this time.   Patient's presentation is most consistent with acute presentation with potential threat to life or bodily function.   Disposition: admit  ____________________________________________  FINAL CLINICAL IMPRESSION(S) / ED DIAGNOSES  Final diagnoses:  Symptomatic anemia    Note:  This document was prepared using Dragon voice recognition software and may include unintentional dictation errors.  Fonda Law, MD, [facility] Emergency Medicine    Gaige Sebo, Fonda MATSU, MD 04/14/24 2013

## 2024-04-14 NOTE — ED Triage Notes (Signed)
 Pt had lab work done yesterday and Hgb was low. Hx cancer. PICC in R arm. Lrg Sacral wound was on antibiotic for it and she completed it. 110/46 BP 86 HR 96% RA CBG 95 T 97.7. Walks with assistance and A&Ox4

## 2024-04-14 NOTE — Hospital Course (Signed)
 [name] is a 83 y.o. female with medical history significant for cholangiocarcinoma on maintenance Durvalumab , PAF on Xarelto , CAD s/p CABG 2021, PAD s/p multiple interventions and b/l TMA, insulin -dependent T2DM, HTN, HLD, CKD stage IIIa, anemia, multiple sacral ulcers with osteomyelitis (on meropenem /doxycycline  >> 05/08/2024) who is admitted with acute on chronic anemia.

## 2024-04-14 NOTE — H&P (Addendum)
 History and Physical    [name] FMW:[id number] DOB: [birth year] DOA: 04/14/2024  PCP: Chrystal Lamarr RAMAN, MD  Patient coming from: [facility]  I have personally briefly reviewed patient's old medical records in [facility]  Chief Complaint: Low hemoglobin  HPI: [name] is a 83 y.o. 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  Patient presenting to ED from SNF for evaluation of acute on chronic anemia, hemoglobin 6.0.  Patient has not seen any obvious bleeding including epistaxis, hemoptysis, hematemesis, hematuria, hematochezia, or melena.  She says stool has been mostly brown although she does see occasional black specks mixed in.  She denies lightheadedness, dizziness, chest pain, dyspnea, nausea, vomiting.  She says she does not have any excessive bleeding from her sacral wounds.  She says these wounds have been progressing well with ongoing wound care and antibiotics.  ED Course  Labs/Imaging on admission: I have personally reviewed following labs and imaging studies.  Initial vitals showed BP 147/57, pulse 74, RR 16, temp 97.5 F, SpO2 100% on room air.  Labs showed hemoglobin 6.0, hematocrit 19.3, platelets 217, WBC 11.0.  FOBT positive.  Sodium 135, potassium 4.3, bicarb 22, BUN 27, creatinine 1.03, serum glucose 86, AST 34, ALT 21, alk phos 135, total bilirubin 0.6, INR 3.4.  Ferritin 25, iron  17, TIBC 308, iron  sat 6, folate 8.4, B12 549.  Patient was ordered to receive 2 unit PRBC transfusion.  The hospitalist service was consulted to admit.  Review of Systems: All systems reviewed and are negative except as  documented in history of present illness above.   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 There is a small calcified mass in the L:A near the LA appendage. No valvular masses seen with associated mitral annular calcification. LA Volume/ BSA27.4 ml/m2 No AS. Right ventricular systolic pressure is elevated at .   Jaundice    Myocardial infarction [facility])    June 2021   Neuromuscular disorder (HCC)    neuropathy in bilateral feet   Peripheral vascular disease (HCC)    Pneumonia    not hosp.    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  PTCAs with drug coated balloon Lt ant tibial artery and Lt popliteal artery  11/26/2019    Past Surgical History:  Procedure Laterality Date   ABDOMINAL AORTAGRAM  11/25/2019   ABDOMINAL AORTOGRAM W/LOWER EXTREMITY    ABDOMINAL AORTOGRAM N/A 06/15/2019   Procedure: ABDOMINAL AORTOGRAM;  Surgeon: Court Dorn PARAS, MD;  [facility];  Service: Cardiovascular;  Laterality: N/A;   ABDOMINAL AORTOGRAM W/LOWER EXTREMITY N/A 11/25/2019   Procedure: ABDOMINAL AORTOGRAM W/LOWER EXTREMITY;  Surgeon: Darron Deatrice LABOR, MD;  [facility];  Service: Cardiovascular;  Laterality: N/A;  Lt leg   ABDOMINAL AORTOGRAM W/LOWER EXTREMITY N/A 08/01/2020   Procedure: ABDOMINAL AORTOGRAM W/LOWER EXTREMITY;  Surgeon: Court Dorn PARAS, MD;  [facility];   Service: Cardiovascular;  Laterality: N/A;   ABDOMINAL AORTOGRAM W/LOWER EXTREMITY N/A 04/28/2021   Procedure: ABDOMINAL AORTOGRAM W/LOWER EXTREMITY;  Surgeon: Harvey Carlin BRAVO, MD;  [facility];  Service: Cardiovascular;  Laterality: N/A;   ABDOMINAL AORTOGRAM W/LOWER EXTREMITY N/A 10/13/2021   Procedure: ABDOMINAL AORTOGRAM W/LOWER EXTREMITY;  Surgeon: Magda Debby SAILOR, MD;  [facility];  Service: Cardiovascular;  Laterality: N/A;   ABDOMINAL AORTOGRAM W/LOWER EXTREMITY N/A 11/09/2022   Procedure: ABDOMINAL AORTOGRAM W/LOWER EXTREMITY;  Surgeon: Magda Debby SAILOR, MD;  [facility];  Service: Cardiovascular;  Laterality: N/A;   ABDOMINAL AORTOGRAM W/LOWER EXTREMITY N/A 11/16/2022   Procedure: ABDOMINAL AORTOGRAM W/LOWER EXTREMITY;  Surgeon: Magda Debby SAILOR, MD;  [facility];  Service: Cardiovascular;  Laterality: N/A;   AMPUTATION TOE Left 05/03/2021   Procedure: AMPUTATION OF THIRD LEFT  TOE;  Surgeon: Magda Debby SAILOR, MD;  Location: [facility] OR;  Service: Vascular;  Laterality: Left;   APPENDECTOMY     APPLICATION OF WOUND VAC Right 07/21/2019   Procedure: Application Of Prevena Wound Vac Right Groin;  Surgeon: Serene Gaile ORN, MD;  Location: [facility] OR;  Service: Vascular;  Laterality: Right;   BILIARY BRUSHING  04/07/2023   Procedure: BILIARY BRUSHING;  Surgeon: Rosalie Kitchens, MD;  Location: [facility] ENDOSCOPY;  Service: Gastroenterology;;   BILIARY STENT PLACEMENT  04/07/2023   Procedure: BILIARY STENT PLACEMENT;  Surgeon: Rosalie Kitchens, MD;  Location: [facility] ENDOSCOPY;  Service: Gastroenterology;;   CARPAL TUNNEL RELEASE Left    CARPAL TUNNEL RELEASE Right    CESAREAN SECTION     x 2   CHOLECYSTECTOMY N/A 10/12/2022   Procedure: LAPAROSCOPIC PARTIAL FENESTRATING CHOLECYSTECTOMY;  Surgeon: Dasie Leonor CROME, MD;  [facility];  Service: General;  Laterality: N/A;   COLONOSCOPY     CORONARY ARTERY BYPASS GRAFT N/A 03/24/2020   Procedure:  CORONARY ARTERY BYPASS GRAFTING (CABG) using LIMA to LAD (m); RIMA to RAMUS; Endoscopic Right Greater Saphenous Vein: SVG to Diag1; SVG to PLB (right); and SVG to PL (left).;  Surgeon: German Bartlett PEDLAR, MD;  [facility];  Service: Open Heart Surgery;  Laterality: N/A;  BILATERAL IMA   ENDARTERECTOMY FEMORAL Right 07/21/2019   Procedure: RIGHT ENDARTERECTOMY FEMORAL WITH PATCH ANGIOPLASTY;  Surgeon: Serene Gaile ORN, MD;  [facility];  Service: Vascular;  Laterality: Right;   ENDARTERECTOMY FEMORAL Right 08/16/2020   Procedure: RIGHT FEMORAL ENDARTERECTOMY;  Surgeon: Sheree Penne Bruckner, MD;  Location: [facility] OR;  Service: Vascular;  Laterality: Right;   ENDOVEIN HARVEST OF GREATER SAPHENOUS VEIN Right 03/24/2020   Procedure: ENDOVEIN HARVEST OF GREATER SAPHENOUS VEIN;  Surgeon: German Bartlett PEDLAR, MD;  [facility];  Service: Open Heart Surgery;  Laterality: Right;   ERCP N/A 04/07/2023   Procedure: ENDOSCOPIC RETROGRADE CHOLANGIOPANCREATOGRAPHY (ERCP);  Surgeon: Rosalie Kitchens, MD;  Location: [facility] ENDOSCOPY;  Service: Gastroenterology;  Laterality: N/A;   EYE SURGERY     cataract removal bilaterally   FEMORAL-POPLITEAL BYPASS GRAFT Right 08/16/2020   Procedure: BYPASS GRAFT FEMORAL-POPLITEAL ARTERY;  Surgeon: Sheree Penne Bruckner, MD;  [facility];  Service: Vascular;  Laterality: Right;   FEMORAL-POPLITEAL BYPASS GRAFT Left 05/03/2021   Procedure: LEFT FEMORAL TO BELOW KNEE POPLITEAL ARTERY BYPASS GRAFTING WITH 6MMX80 PTFE GRAFT;  Surgeon: Magda Debby SAILOR, MD;  [facility];  Service: Vascular;  Laterality: Left;   LEFT HEART CATH AND CORONARY ANGIOGRAPHY N/A 03/22/2020   Procedure: LEFT HEART CATH AND CORONARY ANGIOGRAPHY;  Surgeon: [place], Peter M, MD;  Location: [facility] INVASIVE CV LAB;  Service: Cardiovascular;  Laterality: N/A;   LOWER EXTREMITY ANGIOGRAPHY Bilateral 06/15/2019   Procedure: Lower Extremity Angiography;  Surgeon: Court Dorn PARAS, MD;  Location: [facility] INVASIVE CV  LAB;  Service: Cardiovascular;  Laterality: Bilateral;   LOWER EXTREMITY ANGIOGRAPHY Right 08/03/2019   Procedure: LOWER EXTREMITY ANGIOGRAPHY;  Surgeon: Court Dorn PARAS, MD;  [facility];  Service: Cardiovascular;  Laterality: Right;   LOWER EXTREMITY ANGIOGRAPHY N/A 09/07/2021   Procedure: LOWER EXTREMITY ANGIOGRAPHY;  Surgeon: Gretta Lonni PARAS, MD;  [facility];  Service: Cardiovascular;  Laterality: N/A;   PATCH ANGIOPLASTY Right 07/21/2019   Procedure: Patch Angioplasty Right Femoral Artery;  Surgeon: Serene Gaile ORN, MD;  Location: [facility] OR;  Service: Vascular;  Laterality: Right;   PERIPHERAL INTRAVASCULAR LITHOTRIPSY  11/25/2019   Procedure: INTRAVASCULAR LITHOTRIPSY;  Surgeon: Darron Deatrice LABOR, MD;  [facility];  Service: Cardiovascular;;  LT. SFA   PERIPHERAL VASCULAR ATHERECTOMY  08/03/2019   Procedure: PERIPHERAL VASCULAR ATHERECTOMY;  Surgeon: Court Dorn PARAS, MD;  Location: [facility] INVASIVE CV LAB;  Service: Cardiovascular;;  right SFA, right TP trunk   PERIPHERAL VASCULAR ATHERECTOMY  11/25/2019   Procedure: PERIPHERAL VASCULAR ATHERECTOMY;  Surgeon: Darron Deatrice LABOR, MD;  [facility];  Service: Cardiovascular;;  Lt.  POPLITEAL and AT   PERIPHERAL VASCULAR BALLOON ANGIOPLASTY Left 06/15/2019   Procedure: PERIPHERAL VASCULAR BALLOON ANGIOPLASTY;  Surgeon: Court Dorn PARAS, MD;  [facility];  Service: Cardiovascular;  Laterality: Left;  SFA UNSUCCESSFUL UNABLE TO CROSS LESION   PERIPHERAL VASCULAR BALLOON ANGIOPLASTY  08/03/2019   Procedure: PERIPHERAL VASCULAR BALLOON ANGIOPLASTY;  Surgeon: Court Dorn PARAS, MD;  [facility];  Service: Cardiovascular;;  right SFA, Right TP trunk   PERIPHERAL VASCULAR BALLOON ANGIOPLASTY  09/07/2021   Procedure: PERIPHERAL VASCULAR BALLOON ANGIOPLASTY;  Surgeon: Gretta Lonni PARAS, MD;  [facility];  Service: Cardiovascular;;    PERIPHERAL VASCULAR BALLOON ANGIOPLASTY Right 10/13/2021   Procedure: PERIPHERAL VASCULAR BALLOON ANGIOPLASTY;  Surgeon: Magda Debby SAILOR, MD;  [facility];  Service: Cardiovascular;  Laterality: Right;   PERIPHERAL VASCULAR BALLOON ANGIOPLASTY  11/09/2022   Procedure: PERIPHERAL VASCULAR BALLOON ANGIOPLASTY;  Surgeon: Magda Debby SAILOR, MD;  [facility];  Service: Cardiovascular;;  fem-pop bypass and AT   PORTACATH PLACEMENT Left 04/29/2023   Procedure: INSERTION PORT-A-CATH;  Surgeon: Dasie Leonor CROME, MD;  Location: [facility] ORS;  Service: General;  Laterality: Left;   PORTACATH PLACEMENT N/A 07/12/2023   Procedure: REVISION PORT-A-CATH;  Surgeon: Dasie Leonor CROME, MD;  Location: [facility] OR;  Service: General;  Laterality: N/A;   REMOVAL OF STONES  04/07/2023   Procedure: REMOVAL OF STONES;  Surgeon: Rosalie Kitchens, MD;  Location: [facility] ENDOSCOPY;  Service: Gastroenterology;;   ANNETT  04/07/2023   Procedure: ANNETT;  Surgeon: Rosalie Kitchens, MD;  Location: [facility] ENDOSCOPY;  Service: Gastroenterology;;   TEE WITHOUT CARDIOVERSION N/A 03/24/2020   Procedure: TRANSESOPHAGEAL ECHOCARDIOGRAM (TEE);  Surgeon: German Bartlett PEDLAR,  MD;  [facility];  Service: Open Heart Surgery;  Laterality: N/A;   TONSILLECTOMY     and adenoidectomy   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/2019   Procedure: TRANSMETATARSAL AMPUTATION;  Surgeon: Gershon Donnice SAUNDERS, DPM;  Location: [facility] OR;  Service: Podiatry;  Laterality: Right;   TRANSMETATARSAL AMPUTATION Left 05/11/2021   Procedure: LEFT TRANSMETATARSAL AMPUTATION;  Surgeon: Sheree Penne Bruckner, MD;  Location: [facility] OR;  Service: Vascular;  Laterality: Left;   TUBAL LIGATION      Social History: Social History   Tobacco Use   Smoking status: Former    Current packs/day: 0.00    Types: Cigarettes    Quit date: 03/31/1972    Years since quitting: 52.0   Smokeless tobacco: Never  Vaping Use   Vaping status: Never Used  Substance Use  Topics   Alcohol  use: No   Drug use: No   Allergies  Allergen Reactions   Versed  [Midazolam ] Anxiety    Frantic, out of my mind, agitated    Augmentin [Amoxicillin-Pot Clavulanate] Other (See Comments)    Elevates potassium level   Bactrim  [Sulfamethoxazole -Trimethoprim ] Other (See Comments)    ^ K+( elevated)    Demerol [Meperidine]     Delusional    Meperidine Hcl     Other reaction(s): delusional   Scopolamine     Delusional   Patch *   Tramadol  Other (See Comments)    Keeps awake    Family History  Problem Relation Age of Onset   Cancer - Prostate Father    Cancer - Colon Father    Stroke Mother    Hypertension Mother    Hyperlipidemia Mother    Melanoma Brother      Prior to Admission medications   Medication Sig Start Date End Date Taking? Authorizing Provider  brimonidine  (ALPHAGAN ) 0.2 % ophthalmic solution Place 1 drop into the right eye 2 (two) times daily. 01/19/21   [provider]  empagliflozin  (JARDIANCE ) 25 MG TABS tablet Take 1 tablet (25 mg total) by mouth daily before breakfast. 07/29/23   Thapa, [place], MD  feeding supplement (ENSURE ENLIVE / ENSURE PLUS) LIQD Take 237 mLs by mouth 2 (two) times daily between meals. 02/18/24   Krishnan, Gokul, MD  Insulin  Lispro Prot & Lispro (HUMALOG  MIX 75/25 KWIKPEN) (75-25) 100 UNIT/ML Kwikpen INJECT 6 UNITS SUBCUTANEOUSLY ONCE DAILY WITH BREAKFAST 09/26/23   Thapa, [place], MD  Insulin  Pen Needle (ULTRA-THIN II MINI PEN NEEDLE) 31G X 5 MM MISC Use as directed to check blood glucose 07/29/23   Thapa, [place], MD  Lancets [facility] DELICA PLUS [place]) MISC Use to check blood sugars twice daily. Patient taking differently: Use to check blood sugars  daily. 04/24/21   [place], MD  MAGnesium -Oxide 400 (240 Mg) MG tablet Take 1 tablet by mouth twice daily 01/22/24   Boscia, Heather E, NP  metFORMIN  (GLUCOPHAGE ) 1000 MG tablet Take 1 tablet (1,000 mg total) by mouth 2 (two) times daily. 08/21/23   Court Dorn PARAS, MD   metoprolol  tartrate (LOPRESSOR ) 25 MG tablet Take 1 tablet (25 mg total) by mouth 2 (two) times daily. 02/18/24   Krishnan, Gokul, MD  mirtazapine  (REMERON ) 7.5 MG tablet Take 1 tablet (7.5 mg total) by mouth at bedtime. 03/20/24   Lanny Callander, MD  mupirocin  ointment (BACTROBAN ) 2 % Apply 1 Application topically 2 (two) times daily. Patient taking differently: Apply 1 Application topically 2 (two) times daily as needed (wound care for toes). 03/14/23   Gershon Donnice SAUNDERS, DPM  ONETOUCH VERIO test strip USE TO CHECK BLOOD SUGARS ONCE DAILY 07/19/22   [place], MD  polyethylene glycol (MIRALAX  / GLYCOLAX ) 17 g packet Take 17 g by mouth daily. 02/19/24   Krishnan, Gokul, MD  Propylene Glycol (SYSTANE BALANCE) 0.6 % SOLN Place 1 drop into the left eye 3 (three) times daily as needed (dry/irritated eyes).    [provider]  repaglinide  (PRANDIN ) 1 MG tablet Take 1 tablet (1 mg total) by mouth 2 (two) times daily before a meal. Take 1 tab by breakfast and 1 tab before dinner 09/26/23   Thapa, [place], MD  Rivaroxaban  (XARELTO ) 15 MG TABS tablet Take 1 tablet (15 mg total) by mouth daily with supper. 02/18/24   Krishnan, Gokul, MD  rosuvastatin  (CRESTOR ) 40 MG tablet Take 1 tablet (40 mg total) by mouth daily. 02/06/24 05/06/24  Daneen Damien BROCKS, NP  senna (SENOKOT) 8.6 MG TABS tablet Take 2 tablets (17.2 mg total) by mouth 2 (two) times daily. 02/18/24   Krishnan, Gokul, MD  traZODone  (DESYREL ) 50 MG tablet Take 1 tablet (50 mg total) by mouth at bedtime as needed for sleep. 02/18/24   Verdene Purchase, MD    Physical Exam: Vitals:   04/14/24 1502 04/14/24 1848 04/14/24 1920 04/14/24 1948  BP: (!) 147/57 (!) 148/57 (!) 140/55 (!) 141/67  Pulse: 74 88 92 87  Resp: 16 16 16 16   Temp: (!) 97.5 F (36.4 C) 97.8 F (36.6 C) 97.8 F (36.6 C) 98.1 F (36.7 C)  TempSrc: Oral Oral Oral Oral  SpO2: 100% 100% 100% 100%  Weight:      Height:       Constitutional: Resting in bed, NAD, calm,  comfortable Eyes: EOMI, lids and conjunctivae normal ENMT: Mucous membranes are moist. Posterior pharynx clear of any exudate or lesions.Normal dentition.  Neck: normal, supple, no masses. Respiratory: clear to auscultation bilaterally, no wheezing, no crackles. Normal respiratory effort. No accessory muscle use.  Cardiovascular: Regular rate and rhythm, no murmurs / rubs / gallops. No extremity edema. 2+ pedal pulses.  RUE PICC line. Abdomen: no tenderness, no masses palpated. Musculoskeletal: S/p B/L TMA.  No clubbing / cyanosis. No joint deformity upper and lower extremities. Good ROM, no contractures. Normal muscle tone.  Skin: Warm, dry Neurologic: Sensation intact. Strength 5/5 in all 4.  Psychiatric: Normal judgment and insight. Alert and oriented x 3. Normal mood.   EKG: Ordered and pending.  Assessment/Plan Principal Problem:   Acute on chronic anemia Active Problems:   DM2 (diabetes mellitus, type 2) (HCC)   PAD (peripheral artery disease) (HCC)   Coronary artery disease involving native heart without angina pectoris   Gallbladder cancer (HCC)   Paroxysmal atrial fibrillation (HCC)   Chronic kidney disease, stage 3a (HCC)   Hyperlipidemia associated with type 2 diabetes mellitus (HCC)   [name] is a 83 y.o. female with medical history significant for cholangiocarcinoma on maintenance Durvalumab , PAF on Xarelto , CAD s/p CABG 2021, PAD s/p multiple interventions and b/l TMA, insulin -dependent T2DM, HTN, HLD, CKD stage IIIa, anemia, multiple sacral ulcers with osteomyelitis (on meropenem /doxycycline  >> 05/08/2024) who is admitted with acute on chronic anemia.  Assessment and Plan: Acute on chronic anemia with iron  deficiency: Hemoglobin 6.0 on admission compared to previous 8.8 on 03/20/2024.  Patient has not seen obvious bleeding however Hemoccult is positive. - Transfusing 2 unit PRBCs - Holding Xarelto  - Repeat CBC in a.m.  Paroxysmal atrial fibrillation: Holding  Xarelto .  Continue Lopressor .  CAD s/p CABG  PAD: Holding Xarelto .  Continue rosuvastatin .  Cholangiocarcinoma: Follows with oncology Dr. Lanny.  S/p chemoradiation now on maintenance immunotherapy.  Sacral ulcers with sacral osteomyelitis: On IV meropenem  and oral doxycycline  per ID through 05/08/2024, will continue antibiotics.  RUE PICC line in place.  Consult to wound care.  Type 2 diabetes: Well-controlled with last hemoglobin A1c improved to 4.9%.  Placed on sensitive SSI.  CKD stage IIIa: Renal function stable.  Hyperlipidemia: Continue rosuvastatin .   DVT prophylaxis: SCDs Start: 04/14/24 2034 Code Status: Full code, confirmed with patient on admission Family Communication: Discussed with patient, she has discussed with family Disposition Plan: From SNF and likely discharge to SNF pending clinical progress Consults called: None Severity of Illness: The appropriate patient status for this patient is OBSERVATION. Observation status is judged to be reasonable and necessary in order to provide the required intensity of service to ensure the patient's safety. The patient's presenting symptoms, physical exam findings, and initial radiographic and laboratory data in the context of their medical condition is felt to place them at decreased risk for further clinical deterioration. Furthermore, it is anticipated that the patient will be medically stable for discharge from the hospital within 2 midnights of admission.   Jorie Blanch MD [facility]  If 7PM-7AM, please contact night-coverage [email or web address]  04/14/2024, 9:43 PM

## 2024-04-15 ENCOUNTER — Encounter (HOSPITAL_COMMUNITY): Payer: Self-pay | Admitting: Internal Medicine

## 2024-04-15 DIAGNOSIS — Z7901 Long term (current) use of anticoagulants: Secondary | ICD-10-CM | POA: Diagnosis not present

## 2024-04-15 DIAGNOSIS — I129 Hypertensive chronic kidney disease with stage 1 through stage 4 chronic kidney disease, or unspecified chronic kidney disease: Secondary | ICD-10-CM | POA: Diagnosis present

## 2024-04-15 DIAGNOSIS — L89154 Pressure ulcer of sacral region, stage 4: Secondary | ICD-10-CM | POA: Diagnosis present

## 2024-04-15 DIAGNOSIS — Z951 Presence of aortocoronary bypass graft: Secondary | ICD-10-CM | POA: Diagnosis not present

## 2024-04-15 DIAGNOSIS — M4628 Osteomyelitis of vertebra, sacral and sacrococcygeal region: Secondary | ICD-10-CM | POA: Insufficient documentation

## 2024-04-15 DIAGNOSIS — D649 Anemia, unspecified: Secondary | ICD-10-CM | POA: Diagnosis present

## 2024-04-15 DIAGNOSIS — I739 Peripheral vascular disease, unspecified: Secondary | ICD-10-CM

## 2024-04-15 DIAGNOSIS — E113393 Type 2 diabetes mellitus with moderate nonproliferative diabetic retinopathy without macular edema, bilateral: Secondary | ICD-10-CM | POA: Diagnosis not present

## 2024-04-15 DIAGNOSIS — E1165 Type 2 diabetes mellitus with hyperglycemia: Secondary | ICD-10-CM | POA: Diagnosis not present

## 2024-04-15 DIAGNOSIS — D8481 Immunodeficiency due to conditions classified elsewhere: Secondary | ICD-10-CM | POA: Diagnosis not present

## 2024-04-15 DIAGNOSIS — S31000A Unspecified open wound of lower back and pelvis without penetration into retroperitoneum, initial encounter: Secondary | ICD-10-CM | POA: Insufficient documentation

## 2024-04-15 DIAGNOSIS — I480 Paroxysmal atrial fibrillation: Secondary | ICD-10-CM

## 2024-04-15 DIAGNOSIS — M6281 Muscle weakness (generalized): Secondary | ICD-10-CM | POA: Diagnosis not present

## 2024-04-15 DIAGNOSIS — R531 Weakness: Secondary | ICD-10-CM | POA: Diagnosis not present

## 2024-04-15 DIAGNOSIS — C23 Malignant neoplasm of gallbladder: Secondary | ICD-10-CM

## 2024-04-15 DIAGNOSIS — K219 Gastro-esophageal reflux disease without esophagitis: Secondary | ICD-10-CM | POA: Diagnosis present

## 2024-04-15 DIAGNOSIS — C221 Intrahepatic bile duct carcinoma: Secondary | ICD-10-CM | POA: Insufficient documentation

## 2024-04-15 DIAGNOSIS — R197 Diarrhea, unspecified: Secondary | ICD-10-CM | POA: Diagnosis not present

## 2024-04-15 DIAGNOSIS — I252 Old myocardial infarction: Secondary | ICD-10-CM | POA: Diagnosis not present

## 2024-04-15 DIAGNOSIS — Z7984 Long term (current) use of oral hypoglycemic drugs: Secondary | ICD-10-CM | POA: Diagnosis not present

## 2024-04-15 DIAGNOSIS — E785 Hyperlipidemia, unspecified: Secondary | ICD-10-CM

## 2024-04-15 DIAGNOSIS — E1169 Type 2 diabetes mellitus with other specified complication: Secondary | ICD-10-CM | POA: Diagnosis present

## 2024-04-15 DIAGNOSIS — Z87891 Personal history of nicotine dependence: Secondary | ICD-10-CM | POA: Diagnosis not present

## 2024-04-15 DIAGNOSIS — Z9221 Personal history of antineoplastic chemotherapy: Secondary | ICD-10-CM | POA: Diagnosis not present

## 2024-04-15 DIAGNOSIS — J189 Pneumonia, unspecified organism: Secondary | ICD-10-CM | POA: Diagnosis not present

## 2024-04-15 DIAGNOSIS — N1831 Chronic kidney disease, stage 3a: Secondary | ICD-10-CM | POA: Diagnosis present

## 2024-04-15 DIAGNOSIS — E1151 Type 2 diabetes mellitus with diabetic peripheral angiopathy without gangrene: Secondary | ICD-10-CM | POA: Diagnosis present

## 2024-04-15 DIAGNOSIS — Z7401 Bed confinement status: Secondary | ICD-10-CM | POA: Diagnosis not present

## 2024-04-15 DIAGNOSIS — E1122 Type 2 diabetes mellitus with diabetic chronic kidney disease: Secondary | ICD-10-CM

## 2024-04-15 DIAGNOSIS — I2510 Atherosclerotic heart disease of native coronary artery without angina pectoris: Secondary | ICD-10-CM | POA: Diagnosis present

## 2024-04-15 DIAGNOSIS — Z83438 Family history of other disorder of lipoprotein metabolism and other lipidemia: Secondary | ICD-10-CM | POA: Diagnosis not present

## 2024-04-15 DIAGNOSIS — Z923 Personal history of irradiation: Secondary | ICD-10-CM | POA: Diagnosis not present

## 2024-04-15 DIAGNOSIS — D509 Iron deficiency anemia, unspecified: Secondary | ICD-10-CM | POA: Diagnosis present

## 2024-04-15 DIAGNOSIS — M6259 Muscle wasting and atrophy, not elsewhere classified, multiple sites: Secondary | ICD-10-CM | POA: Diagnosis not present

## 2024-04-15 DIAGNOSIS — Z8249 Family history of ischemic heart disease and other diseases of the circulatory system: Secondary | ICD-10-CM | POA: Diagnosis not present

## 2024-04-15 DIAGNOSIS — R195 Other fecal abnormalities: Secondary | ICD-10-CM | POA: Diagnosis not present

## 2024-04-15 DIAGNOSIS — Z794 Long term (current) use of insulin: Secondary | ICD-10-CM | POA: Diagnosis not present

## 2024-04-15 LAB — GLUCOSE, CAPILLARY
Glucose-Capillary: 108 mg/dL — ABNORMAL HIGH (ref 70–99)
Glucose-Capillary: 130 mg/dL — ABNORMAL HIGH (ref 70–99)
Glucose-Capillary: 175 mg/dL — ABNORMAL HIGH (ref 70–99)
Glucose-Capillary: 74 mg/dL (ref 70–99)
Glucose-Capillary: 80 mg/dL (ref 70–99)
Glucose-Capillary: 86 mg/dL (ref 70–99)

## 2024-04-15 LAB — CBC
HCT: 27 % — ABNORMAL LOW (ref 36.0–46.0)
Hemoglobin: 8.7 g/dL — ABNORMAL LOW (ref 12.0–15.0)
MCH: 28.1 pg (ref 26.0–34.0)
MCHC: 32.2 g/dL (ref 30.0–36.0)
MCV: 87.1 fL (ref 80.0–100.0)
Platelets: 174 K/uL (ref 150–400)
RBC: 3.1 MIL/uL — ABNORMAL LOW (ref 3.87–5.11)
RDW: 19.8 % — ABNORMAL HIGH (ref 11.5–15.5)
WBC: 9.9 K/uL (ref 4.0–10.5)
nRBC: 0.2 % (ref 0.0–0.2)

## 2024-04-15 LAB — TYPE AND SCREEN
ABO/RH(D): O POS
Antibody Screen: NEGATIVE
Unit division: 0
Unit division: 0

## 2024-04-15 LAB — BPAM RBC
Blood Product Expiration Date: 202507252359
Blood Product Expiration Date: 202508162359
ISSUE DATE / TIME: 202507221927
ISSUE DATE / TIME: 202507222328
Unit Type and Rh: 5100
Unit Type and Rh: 5100

## 2024-04-15 LAB — BASIC METABOLIC PANEL WITH GFR
Anion gap: 7 (ref 5–15)
BUN: 23 mg/dL (ref 8–23)
CO2: 23 mmol/L (ref 22–32)
Calcium: 8.8 mg/dL — ABNORMAL LOW (ref 8.9–10.3)
Chloride: 106 mmol/L (ref 98–111)
Creatinine, Ser: 1 mg/dL (ref 0.44–1.00)
GFR, Estimated: 56 mL/min — ABNORMAL LOW (ref >60)
Glucose, Bld: 108 mg/dL — ABNORMAL HIGH (ref 70–99)
Potassium: 4.5 mmol/L (ref 3.5–5.1)
Sodium: 136 mmol/L (ref 135–145)

## 2024-04-15 LAB — HEMOGLOBIN AND HEMATOCRIT, BLOOD
HCT: 24.7 % — ABNORMAL LOW (ref 36.0–46.0)
Hemoglobin: 7.8 g/dL — ABNORMAL LOW (ref 12.0–15.0)

## 2024-04-15 MED ORDER — DEXTROSE-SODIUM CHLORIDE 5-0.9 % IV SOLN: INTRAVENOUS | Status: AC

## 2024-04-15 MED ORDER — TRAZODONE HCL 50 MG PO TABS
100.0000 mg | ORAL_TABLET | Freq: Every day | ORAL | Status: DC
  Administered 2024-04-16: 100 mg via ORAL
  Filled 2024-04-15: qty 2

## 2024-04-15 MED ORDER — IRON SUCROSE 200 MG IVPB - SIMPLE MED
200.0000 mg | Freq: Once | Status: AC
  Administered 2024-04-16: 200 mg via INTRAVENOUS
  Filled 2024-04-15: qty 200

## 2024-04-15 MED ORDER — SODIUM CHLORIDE 0.9% FLUSH
10.0000 mL | Freq: Two times a day (BID) | INTRAVENOUS | Status: DC
  Administered 2024-04-15 – 2024-04-16 (×4): 10 mL

## 2024-04-15 MED ORDER — PANTOPRAZOLE SODIUM 20 MG PO TBEC
20.0000 mg | DELAYED_RELEASE_TABLET | Freq: Every day | ORAL | Status: DC
  Administered 2024-04-15 – 2024-04-16 (×2): 20 mg via ORAL
  Filled 2024-04-15 (×2): qty 1

## 2024-04-15 MED ORDER — RISAQUAD PO CAPS
1.0000 | ORAL_CAPSULE | Freq: Every day | ORAL | Status: DC
  Administered 2024-04-15 – 2024-04-16 (×2): 1 via ORAL
  Filled 2024-04-15 (×2): qty 1

## 2024-04-15 MED ORDER — ZINC OXIDE 40 % EX OINT
TOPICAL_OINTMENT | Freq: Two times a day (BID) | CUTANEOUS | Status: DC
  Filled 2024-04-15: qty 57

## 2024-04-15 MED ORDER — IRBESARTAN 150 MG PO TABS
150.0000 mg | ORAL_TABLET | Freq: Every day | ORAL | Status: DC
  Administered 2024-04-15 – 2024-04-16 (×2): 150 mg via ORAL
  Filled 2024-04-15 (×2): qty 1

## 2024-04-15 MED ORDER — SODIUM CHLORIDE 0.9% FLUSH
10.0000 mL | INTRAVENOUS | Status: DC | PRN
  Administered 2024-04-16: 10 mL

## 2024-04-15 MED ORDER — CHLORHEXIDINE GLUCONATE CLOTH 2 % EX PADS
6.0000 | MEDICATED_PAD | Freq: Every day | CUTANEOUS | Status: DC
  Administered 2024-04-16: 6 via TOPICAL

## 2024-04-15 NOTE — Assessment & Plan Note (Signed)
 Patient reports several weeks of diarrhea upwards of 4-5 times daily. Possibly related to longstanding antibiotic therapy Obtaining stool studies for C. difficile testing and GI pathogen panel Enteric precautions

## 2024-04-15 NOTE — Assessment & Plan Note (Signed)
Continue home regimen of rosuvastatin

## 2024-04-15 NOTE — Assessment & Plan Note (Signed)
•   Patient is currently chest pain free  Monitoring patient on telemetry  Continue home regimen of antiplatelet therapy, lipid lowering therapy and AV nodal blocking therapy

## 2024-04-15 NOTE — Assessment & Plan Note (Signed)
>>  ASSESSMENT AND PLAN FOR OSTEOMYELITIS OF SACRUM (HCC) WRITTEN ON 04/15/2024 11:22 PM BY KENARD ZACHARY PARAS, MD  Patient evaluated at the bedside by the wound care team Providing patient with wound care further recommendations Continuing antibiotics with doxycycline  and meropenem  for known osteomyelitis

## 2024-04-15 NOTE — Assessment & Plan Note (Signed)
 Patient evaluated at the bedside by the wound care team Providing patient with wound care further recommendations Continuing antibiotics with doxycycline  and meropenem  for known osteomyelitis

## 2024-04-15 NOTE — Progress Notes (Signed)
 Low air mattress ordered at this time d/t md order. Benefits of the mattress explained to the patient and she refused.

## 2024-04-15 NOTE — Consult Note (Addendum)
 WOC Nurse Consult Note: this patient is known to WOC team from previous admission 02/13/2024 when these wounds presented as deep tissue pressure injuries; has since evolved; MRI 02/2024 revealed osteomyelitis making them Stage 4  Is followed by infectious disease and receives ongoing wound care at [facility]  Reason for Consult: sacral ulcers  Wound type:  Stage 4 Pressure injuries sacrum/buttock  Pressure Injury POA: Yes Measurement: see nursing flowsheet  Wound bed: clean, minimal fibrinous tissue scattered  Drainage (amount, consistency, odor) no foul odor per MD note  Periwound: mild maceration  Dressing procedure/placement/frequency:  Cleanse sacral/buttocks wounds with Vashe wound cleanser Soila [phone]) do not rinse and allow to air dry.  Using a Q tip applicator apply Calcium  Alginate Soila [phone]) to wound bed daily making sure to cover any areas of depth and fill in wound bed.  Cover with dry gauze and silicone foam or ABD pad whichever is preferred.    Will order a thin layer of Desitin to be place to surrounding intact skin 2 times daily and prn soiling.   Patient should be placed on a low air loss mattress for pressure redistribution and moisture management.  POC discussed with bedside nurse. WOC team will not follow. Patient should continue with ongoing care at facility and with infectious disease.   Thank you,    Powell Bar MSN, RN-BC, [employer]

## 2024-04-15 NOTE — Plan of Care (Signed)
  Problem: Education: Goal: Individualized Educational Video(s) Outcome: Progressing   Problem: Fluid Volume: Goal: Ability to maintain a balanced intake and output will improve Outcome: Progressing   Problem: Health Behavior/Discharge Planning: Goal: Ability to manage health-related needs will improve Outcome: Progressing   Problem: Nutritional: Goal: Maintenance of adequate nutrition will improve Outcome: Progressing   Problem: Education: Goal: Knowledge of General Education information will improve Description: Including pain rating scale, medication(s)/side effects and non-pharmacologic comfort measures Outcome: Progressing   Problem: Health Behavior/Discharge Planning: Goal: Ability to manage health-related needs will improve Outcome: Progressing   Problem: Clinical Measurements: Goal: Respiratory complications will improve Outcome: Progressing Goal: Cardiovascular complication will be avoided Outcome: Progressing

## 2024-04-15 NOTE — Assessment & Plan Note (Signed)
 Follows with Dr. Lanny with oncology

## 2024-04-15 NOTE — Plan of Care (Signed)
  Problem: Education: Goal: Ability to describe self-care measures that may prevent or decrease complications (Diabetes Survival Skills Education) will improve Outcome: Progressing   Problem: Metabolic: Goal: Ability to maintain appropriate glucose levels will improve Outcome: Progressing   Problem: Clinical Measurements: Goal: Will remain free from infection Outcome: Progressing Goal: Diagnostic test results will improve Outcome: Progressing Goal: Cardiovascular complication will be avoided Outcome: Progressing   Problem: Elimination: Goal: Will not experience complications related to urinary retention Outcome: Progressing   Problem: Pain Managment: Goal: General experience of comfort will improve and/or be controlled Outcome: Progressing   Problem: Safety: Goal: Ability to remain free from injury will improve Outcome: Progressing

## 2024-04-15 NOTE — Assessment & Plan Note (Signed)
 Holding Xarelto  for now in setting of significant anemia Continuing Lopressor  Rate currently controlled Monitoring on telemetry

## 2024-04-15 NOTE — Assessment & Plan Note (Signed)
 Hemoglobin A1c 4.9% 01/2024 Accu-Cheks before every meal and nightly with sign scale insulin 

## 2024-04-15 NOTE — Progress Notes (Signed)
 PROGRESS NOTE   [name]  FMW:[id number] DOB: 28-Aug-1941 DOA: 04/14/2024 PCP: Chrystal Lamarr RAMAN, MD   Date of Service: the patient was seen and examined on 04/15/2024  Brief Narrative:  [name] is a 83 y.o. 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  On evaluation in the emergency department patient Xarelto  was held.  Patient's Hemoccult was found to be positive.  2 units of packed red blood cells were administered.   Assessment & Plan Acute on chronic anemia Patient exhibiting downtrending hemoglobin and hematocrit, likely multifactorial in origin Possibly secondary to ongoing infection superimposed on poor diet resulting in iron  deficiency anemia Anemia workup revealing iron  deficiency anemia Initiating Venofer  infusion Monitoring hemoglobin and hematocrit with serial CBCs Case additionally discussed with Dr. Burnette who evaluated the patient in the bedside, his input is appreciated.  Dr. Burnette recommended conservative management this time. Will advance diet as tolerated Sacral wound Osteomyelitis of sacrum (HCC) Patient evaluated at the bedside by the wound care team Providing patient with wound care further recommendations Continuing antibiotics with doxycycline  and meropenem  for known osteomyelitis  Acute diarrhea Patient reports several weeks of diarrhea upwards of 4-5 times daily. Possibly related to longstanding antibiotic therapy Obtaining stool studies for C. difficile testing and GI pathogen panel Enteric precautions Cholangiocarcinoma (HCC) Follows with Dr. Lanny with oncology Paroxysmal atrial fibrillation  (HCC) Holding Xarelto  for now in setting of significant anemia Continuing Lopressor  Rate currently controlled Monitoring on telemetry Chronic kidney disease, stage 3a (HCC) Strict intake and output monitoring Creatinine near baseline Minimizing nephrotoxic agents as much as possible Serial chemistries to monitor renal function and electrolytes  Coronary artery disease involving native heart without angina pectoris Patient is currently chest pain free Monitoring patient on telemetry Continue home regimen of antiplatelet therapy, lipid lowering therapy and AV nodal blocking therapy  Type 2 diabetes mellitus with stage 3a chronic kidney disease, without long-term current use of insulin  (HCC) Hemoglobin A1c 4.9% 01/2024 Accu-Cheks before every meal and nightly with sign scale insulin   Hyperlipidemia associated with type 2 diabetes mellitus (HCC) Continue home regimen of rosuvastatin      Subjective:  Patient complaining of ongoing diarrhea, upwards of 4-5 times daily.  Patient states that this has been ongoing for the past several weeks.  Patient complains of some associated mild abdominal pain.  Patient denies fevers or changes in appetite.  Physical Exam:  Vitals:   04/14/24 2333 04/14/24 2351 04/15/24 0300 04/15/24 0732  BP: (!) 153/56  (!) 134/49 (!) 120/46  Pulse: 95 96 85 85  Resp: 16  16 18   Temp: 98 F (36.7 C) 97.8 F (36.6 C) 98.2 F (36.8 C) 98.4 F (36.9 C)  TempSrc: Oral Oral Oral Oral  SpO2:  98% 94% 100%  Weight:      Height:        Constitutional: Awake alert and oriented x3, no associated distress.   Skin: Sacral dressings clean dry and intact.  Poor skin turgor. Eyes: Pupils are equally reactive to light.  No evidence of scleral icterus or conjunctival pallor.  ENMT: Moist mucous membranes noted.  Posterior pharynx clear of any exudate or lesions.   Respiratory: clear to auscultation bilaterally, no wheezing, no crackles. Normal  respiratory effort. No  accessory muscle use.  Cardiovascular: Regular rate and rhythm, no murmurs / rubs / gallops. No extremity edema. 2+ pedal pulses. No carotid bruits.  Abdomen: Abdomen is soft and nontender.  No evidence of intra-abdominal masses.  Positive bowel sounds noted in all quadrants.   Musculoskeletal: No joint deformity upper and lower extremities. Good ROM, no contractures. Normal muscle tone.    Data Reviewed:  I have personally reviewed and interpreted labs, imaging.  Significant findings are   CBC: Recent Labs  Lab 04/14/24 1601 04/15/24 0548  WBC 11.0* 9.9  NEUTROABS 8.8*  --   HGB 6.0* 8.7*  HCT 19.3* 27.0*  MCV 92.8 87.1  PLT 217 174   Basic Metabolic Panel: Recent Labs  Lab 04/14/24 1601 04/15/24 0548  NA 135 136  K 4.3 4.5  CL 105 106  CO2 22 23  GLUCOSE 86 108*  BUN 27* 23  CREATININE 1.03* 1.00  CALCIUM  8.5* 8.8*   GFR: Estimated Creatinine Clearance: 32.9 mL/min (by C-G formula based on SCr of 1 mg/dL). Liver Function Tests: Recent Labs  Lab 04/14/24 1601  AST 34  ALT 21  ALKPHOS 135*  BILITOT 0.6  PROT 4.9*  ALBUMIN  1.9*    Coagulation Profile: Recent Labs  Lab 04/14/24 1601  INR 3.4*    Code Status:  Full code.  Code status decision has been confirmed with: patient    Severity of Illness:  The appropriate patient status for this patient is INPATIENT. Inpatient status is judged to be reasonable and necessary in order to provide the required intensity of service to ensure the patient's safety. The patient's presenting symptoms, physical exam findings, and initial radiographic and laboratory data in the context of their chronic comorbidities is felt to place them at high risk for further clinical deterioration. Furthermore, it is not anticipated that the patient will be medically stable for discharge from the hospital within 2 midnights of admission.   * I certify that at the point of admission it is my clinical judgment that the patient will  require inpatient hospital care spanning beyond 2 midnights from the point of admission due to high intensity of service, high risk for further deterioration and high frequency of surveillance required.*  Time spent:  50 minutes  Author:  Zachary JINNY Ba MD  04/15/2024 9:57 AM

## 2024-04-15 NOTE — Consult Note (Signed)
 [facility] Consultation Note  Referring Provider: [facility] Primary Care Physician:  Chrystal Lamarr RAMAN, MD Primary Gastroenterologist:  Dr. Rosalie  Reason for Consultation:  anemia, hemoccult-positive stools  HPI: [name] is a 83 y.o. female whom we've been asked to see for anemia and hemoccult-positive stools.  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  No overt bleeding.  No abdominal pain.  On anticoagulation.   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 There is a small calcified mass in the L:A near the LA appendage. No valvular masses seen with associated mitral annular calcification. LA Volume/ BSA27.4 ml/m2 No AS. Right ventricular systolic pressure is elevated at .   Jaundice    Myocardial infarction [facility])    June 2021   Neuromuscular disorder (HCC)    neuropathy in bilateral feet   Peripheral vascular disease (HCC)    Pneumonia    not hosp.    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  PTCAs with drug coated balloon Lt ant tibial artery and Lt popliteal artery  11/26/2019    Past Surgical History:  Procedure Laterality Date   ABDOMINAL AORTAGRAM  11/25/2019   ABDOMINAL AORTOGRAM W/LOWER EXTREMITY    ABDOMINAL AORTOGRAM N/A 06/15/2019   Procedure: ABDOMINAL AORTOGRAM;  Surgeon: Court Dorn PARAS, MD;  [facility];  Service: Cardiovascular;   Laterality: N/A;   ABDOMINAL AORTOGRAM W/LOWER EXTREMITY N/A 11/25/2019   Procedure: ABDOMINAL AORTOGRAM W/LOWER EXTREMITY;  Surgeon: Darron Deatrice LABOR, MD;  [facility];  Service: Cardiovascular;  Laterality: N/A;  Lt leg   ABDOMINAL AORTOGRAM W/LOWER EXTREMITY N/A 08/01/2020   Procedure: ABDOMINAL AORTOGRAM W/LOWER EXTREMITY;  Surgeon: Court Dorn PARAS, MD;  [facility];  Service: Cardiovascular;  Laterality: N/A;   ABDOMINAL AORTOGRAM W/LOWER EXTREMITY N/A 04/28/2021   Procedure: ABDOMINAL AORTOGRAM W/LOWER EXTREMITY;  Surgeon: Harvey Carlin BRAVO, MD;  [facility];  Service: Cardiovascular;  Laterality: N/A;   ABDOMINAL AORTOGRAM W/LOWER EXTREMITY N/A 10/13/2021   Procedure: ABDOMINAL AORTOGRAM W/LOWER EXTREMITY;  Surgeon: Magda Debby SAILOR, MD;  [facility];  Service: Cardiovascular;  Laterality: N/A;   ABDOMINAL AORTOGRAM W/LOWER EXTREMITY N/A 11/09/2022   Procedure: ABDOMINAL AORTOGRAM W/LOWER EXTREMITY;  Surgeon: Magda Debby SAILOR, MD;  [facility];  Service: Cardiovascular;  Laterality: N/A;   ABDOMINAL AORTOGRAM W/LOWER EXTREMITY N/A 11/16/2022   Procedure: ABDOMINAL AORTOGRAM W/LOWER EXTREMITY;  Surgeon: Magda Debby SAILOR, MD;  [facility];  Service: Cardiovascular;  Laterality: N/A;   AMPUTATION TOE Left 05/03/2021   Procedure: AMPUTATION OF THIRD LEFT  TOE;  Surgeon: Magda Debby SAILOR, MD;  Location: [facility] OR;  Service: Vascular;  Laterality: Left;   APPENDECTOMY     APPLICATION OF WOUND VAC Right 07/21/2019   Procedure: Application  Of Prevena Wound Vac Right Groin;  Surgeon: Serene Gaile ORN, MD;  Location: [facility] OR;  Service: Vascular;  Laterality: Right;   BILIARY BRUSHING  04/07/2023   Procedure: BILIARY BRUSHING;  Surgeon: Rosalie Kitchens, MD;  Location: [facility] ENDOSCOPY;  Service: Gastroenterology;;   BILIARY STENT PLACEMENT  04/07/2023   Procedure: BILIARY STENT PLACEMENT;  Surgeon: Rosalie Kitchens,  MD;  Location: [facility] ENDOSCOPY;  Service: Gastroenterology;;   CARPAL TUNNEL RELEASE Left    CARPAL TUNNEL RELEASE Right    CESAREAN SECTION     x 2   CHOLECYSTECTOMY N/A 10/12/2022   Procedure: LAPAROSCOPIC PARTIAL FENESTRATING CHOLECYSTECTOMY;  Surgeon: Dasie Leonor CROME, MD;  [facility];  Service: General;  Laterality: N/A;   COLONOSCOPY     CORONARY ARTERY BYPASS GRAFT N/A 03/24/2020   Procedure: CORONARY ARTERY BYPASS GRAFTING (CABG) using LIMA to LAD (m); RIMA to RAMUS; Endoscopic Right Greater Saphenous Vein: SVG to Diag1; SVG to PLB (right); and SVG to PL (left).;  Surgeon: German Bartlett PEDLAR, MD;  [facility];  Service: Open Heart Surgery;  Laterality: N/A;  BILATERAL IMA   ENDARTERECTOMY FEMORAL Right 07/21/2019   Procedure: RIGHT ENDARTERECTOMY FEMORAL WITH PATCH ANGIOPLASTY;  Surgeon: Serene Gaile ORN, MD;  [facility];  Service: Vascular;  Laterality: Right;   ENDARTERECTOMY FEMORAL Right 08/16/2020   Procedure: RIGHT FEMORAL ENDARTERECTOMY;  Surgeon: Sheree Penne Bruckner, MD;  Location: [facility] OR;  Service: Vascular;  Laterality: Right;   ENDOVEIN HARVEST OF GREATER SAPHENOUS VEIN Right 03/24/2020   Procedure: ENDOVEIN HARVEST OF GREATER SAPHENOUS VEIN;  Surgeon: German Bartlett PEDLAR, MD;  [facility];  Service: Open Heart Surgery;  Laterality: Right;   ERCP N/A 04/07/2023   Procedure: ENDOSCOPIC RETROGRADE CHOLANGIOPANCREATOGRAPHY (ERCP);  Surgeon: Rosalie Kitchens, MD;  Location: [facility] ENDOSCOPY;  Service: Gastroenterology;  Laterality: N/A;   EYE SURGERY     cataract removal bilaterally   FEMORAL-POPLITEAL BYPASS GRAFT Right 08/16/2020   Procedure: BYPASS GRAFT FEMORAL-POPLITEAL ARTERY;  Surgeon: Sheree Penne Bruckner, MD;  Location: [facility] OR;  Service: Vascular;  Laterality: Right;   FEMORAL-POPLITEAL BYPASS GRAFT Left 05/03/2021   Procedure: LEFT FEMORAL TO BELOW KNEE POPLITEAL ARTERY BYPASS GRAFTING WITH 6MMX80 PTFE GRAFT;  Surgeon: Magda Debby SAILOR, MD;  [facility];   Service: Vascular;  Laterality: Left;   LEFT HEART CATH AND CORONARY ANGIOGRAPHY N/A 03/22/2020   Procedure: LEFT HEART CATH AND CORONARY ANGIOGRAPHY;  Surgeon: [place], Peter M, MD;  Location: [facility] INVASIVE CV LAB;  Service: Cardiovascular;  Laterality: N/A;   LOWER EXTREMITY ANGIOGRAPHY Bilateral 06/15/2019   Procedure: Lower Extremity Angiography;  Surgeon: Court Dorn PARAS, MD;  Location: [facility] INVASIVE CV LAB;  Service: Cardiovascular;  Laterality: Bilateral;   LOWER EXTREMITY ANGIOGRAPHY Right 08/03/2019   Procedure: LOWER EXTREMITY ANGIOGRAPHY;  Surgeon: Court Dorn PARAS, MD;  [facility];  Service: Cardiovascular;  Laterality: Right;   LOWER EXTREMITY ANGIOGRAPHY N/A 09/07/2021   Procedure: LOWER EXTREMITY ANGIOGRAPHY;  Surgeon: Gretta Bruckner PARAS, MD;  [facility];  Service: Cardiovascular;  Laterality: N/A;   PATCH ANGIOPLASTY Right 07/21/2019   Procedure: Patch Angioplasty Right Femoral Artery;  Surgeon: Serene Gaile ORN, MD;  Location: [facility] OR;  Service: Vascular;  Laterality: Right;   PERIPHERAL INTRAVASCULAR LITHOTRIPSY  11/25/2019   Procedure: INTRAVASCULAR LITHOTRIPSY;  Surgeon: Darron Deatrice LABOR, MD;  [facility];  Service: Cardiovascular;;  LT. SFA   PERIPHERAL VASCULAR ATHERECTOMY  08/03/2019   Procedure: PERIPHERAL VASCULAR ATHERECTOMY;  Surgeon: Court,  Dorn PARAS, MD;  [facility];  Service: Cardiovascular;;  right SFA, right TP trunk   PERIPHERAL VASCULAR ATHERECTOMY  11/25/2019   Procedure: PERIPHERAL VASCULAR ATHERECTOMY;  Surgeon: Darron Deatrice LABOR, MD;  [facility];  Service: Cardiovascular;;  Lt.  POPLITEAL and AT   PERIPHERAL VASCULAR BALLOON ANGIOPLASTY Left 06/15/2019   Procedure: PERIPHERAL VASCULAR BALLOON ANGIOPLASTY;  Surgeon: Court Dorn PARAS, MD;  [facility];  Service: Cardiovascular;  Laterality: Left;  SFA UNSUCCESSFUL UNABLE TO CROSS LESION   PERIPHERAL  VASCULAR BALLOON ANGIOPLASTY  08/03/2019   Procedure: PERIPHERAL VASCULAR BALLOON ANGIOPLASTY;  Surgeon: Court Dorn PARAS, MD;  [facility];  Service: Cardiovascular;;  right SFA, Right TP trunk   PERIPHERAL VASCULAR BALLOON ANGIOPLASTY  09/07/2021   Procedure: PERIPHERAL VASCULAR BALLOON ANGIOPLASTY;  Surgeon: Gretta Lonni PARAS, MD;  [facility];  Service: Cardiovascular;;   PERIPHERAL VASCULAR BALLOON ANGIOPLASTY Right 10/13/2021   Procedure: PERIPHERAL VASCULAR BALLOON ANGIOPLASTY;  Surgeon: Magda Debby SAILOR, MD;  [facility];  Service: Cardiovascular;  Laterality: Right;   PERIPHERAL VASCULAR BALLOON ANGIOPLASTY  11/09/2022   Procedure: PERIPHERAL VASCULAR BALLOON ANGIOPLASTY;  Surgeon: Magda Debby SAILOR, MD;  [facility];  Service: Cardiovascular;;  fem-pop bypass and AT   PORTACATH PLACEMENT Left 04/29/2023   Procedure: INSERTION PORT-A-CATH;  Surgeon: Dasie Leonor CROME, MD;  Location: [facility] ORS;  Service: General;  Laterality: Left;   PORTACATH PLACEMENT N/A 07/12/2023   Procedure: REVISION PORT-A-CATH;  Surgeon: Dasie Leonor CROME, MD;  Location: [facility] OR;  Service: General;  Laterality: N/A;   REMOVAL OF STONES  04/07/2023   Procedure: REMOVAL OF STONES;  Surgeon: Rosalie Kitchens, MD;  Location: [facility] ENDOSCOPY;  Service: Gastroenterology;;   ANNETT  04/07/2023   Procedure: ANNETT;  Surgeon: Rosalie Kitchens, MD;  Location: [facility] ENDOSCOPY;  Service: Gastroenterology;;   TEE WITHOUT CARDIOVERSION N/A 03/24/2020   Procedure: TRANSESOPHAGEAL ECHOCARDIOGRAM (TEE);  Surgeon: German Bartlett PEDLAR, MD;  Location: [facility] OR;  Service: Open Heart Surgery;  Laterality: N/A;   TONSILLECTOMY     and adenoidectomy   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/2019   Procedure: TRANSMETATARSAL AMPUTATION;  Surgeon: Gershon Donnice SAUNDERS, DPM;  [facility];  Service: Podiatry;  Laterality: Right;   TRANSMETATARSAL AMPUTATION Left 05/11/2021   Procedure:  LEFT TRANSMETATARSAL AMPUTATION;  Surgeon: Sheree Penne Lonni, MD;  Location: [facility] OR;  Service: Vascular;  Laterality: Left;   TUBAL LIGATION      Prior to Admission medications   Medication Sig Start Date End Date Taking? Authorizing Provider  acetaminophen  (TYLENOL ) 500 MG tablet Take 1,000 mg by mouth every 6 (six) hours as needed for moderate pain (pain score 4-6), fever or mild pain (pain score 1-3).   Yes [provider]  acidophilus (RISAQUAD) CAPS capsule Take 1 capsule by mouth daily.   Yes [provider]  Amino Acids-Protein Hydrolys (PRO-STAT) LIQD Take 30 mLs by mouth in the morning and at bedtime. Amino acids protein hydrolys liquid; 17-10 gram-kcal/30 ml for wound healing   Yes [provider]  brimonidine  (ALPHAGAN ) 0.2 % ophthalmic solution Place 1 drop into the right eye 2 (two) times daily. 01/19/21  Yes [provider]  doxycycline  (VIBRA -TABS) 100 MG tablet Take 100 mg by mouth 2 (two) times daily. 04/07/24 05/08/24 Yes [provider]  empagliflozin  (JARDIANCE ) 25 MG TABS tablet Take 1 tablet (25 mg total) by mouth daily before breakfast. 07/29/23  Yes  Thapa, [place], MD  feeding supplement (ENSURE ENLIVE / ENSURE PLUS) LIQD Take 237 mLs by mouth 2 (two) times daily between meals. 02/18/24  Yes Krishnan, Gokul, MD  hydrochlorothiazide  (HYDRODIURIL ) 12.5 MG tablet Take 12.5 mg by mouth daily. 03/27/24  Yes [provider]  Insulin  Lispro Prot & Lispro (HUMALOG  MIX 75/25 KWIKPEN) (75-25) 100 UNIT/ML Kwikpen INJECT 6 UNITS SUBCUTANEOUSLY ONCE DAILY WITH BREAKFAST 09/26/23  Yes Thapa, [place], MD  MAGnesium -Oxide 400 (240 Mg) MG tablet Take 1 tablet by mouth twice daily 01/22/24  Yes Boscia, Heather E, NP  meropenem  (MERREM ) 1 g injection Inject 2 g into the vein daily. Infuse 2 grams via picc line daily for two weeks. Add 20 ml NS to each 1 mg vial, allow to dissolve completely. Remove entire contents from both vials, add to 50 ml or  100 ml bag of NA. Infuse over 30 minutes. Starting 04/07/2024  End date - 04/21/2024 04/07/24  Yes [provider]  Metformin  HCl 500 MG/5ML SOLN Take 1,000 mg by mouth in the morning and at bedtime. 04/13/24  Yes [provider]  metoprolol  tartrate (LOPRESSOR ) 25 MG tablet Take 1 tablet (25 mg total) by mouth 2 (two) times daily. 02/18/24  Yes Krishnan, Gokul, MD  mirtazapine  (REMERON ) 7.5 MG tablet Take 1 tablet (7.5 mg total) by mouth at bedtime. Patient taking differently: Take 15 mg by mouth at bedtime. 03/20/24  Yes Lanny Callander, MD  pantoprazole  (PROTONIX ) 20 MG tablet Take 20 mg by mouth daily. 03/25/24  Yes [provider]  polyethylene glycol (MIRALAX  / GLYCOLAX ) 17 g packet Take 17 g by mouth daily. 02/19/24  Yes Verdene Purchase, MD  potassium chloride  (MICRO-K ) 10 MEQ CR capsule Take 20 mEq by mouth 2 (two) times daily. 04/11/24  Yes [provider]  Propylene Glycol (SYSTANE BALANCE) 0.6 % SOLN Place 1 drop into the left eye 3 (three) times daily as needed (dry/irritated eyes).   Yes [provider]  repaglinide  (PRANDIN ) 1 MG tablet Take 1 tablet (1 mg total) by mouth 2 (two) times daily before a meal. Take 1 tab by breakfast and 1 tab before dinner 09/26/23  Yes Thapa, [place], MD  Rivaroxaban  (XARELTO ) 15 MG TABS tablet Take 1 tablet (15 mg total) by mouth daily with supper. 02/18/24  Yes Krishnan, Gokul, MD  rosuvastatin  (CRESTOR ) 40 MG tablet Take 1 tablet (40 mg total) by mouth daily. 02/06/24 05/06/24 Yes Monge, Damien BROCKS, NP  senna (SENOKOT) 8.6 MG TABS tablet Take 2 tablets (17.2 mg total) by mouth 2 (two) times daily. Patient taking differently: Take 2 tablets by mouth 2 (two) times daily as needed for mild constipation or moderate constipation. 02/18/24  Yes Krishnan, Gokul, MD  telmisartan  (MICARDIS ) 40 MG tablet Take 40 mg by mouth daily. 03/27/24  Yes [provider]  traZODone  (DESYREL ) 100 MG tablet Take 100 mg by mouth at bedtime. 03/12/24   Yes [provider]  Insulin  Pen Needle (ULTRA-THIN II MINI PEN NEEDLE) 31G X 5 MM MISC Use as directed to check blood glucose 07/29/23   Thapa, [place], MD    Current Facility-Administered Medications  Medication Dose Route Frequency Provider Last Rate Last Admin   acetaminophen  (TYLENOL ) tablet 650 mg  650 mg Oral Q6H PRN Patel, Vishal R, MD       Or   acetaminophen  (TYLENOL ) suppository 650 mg  650 mg Rectal Q6H PRN Patel, Vishal R, MD       artificial tears ophthalmic solution 1 drop  1 drop Left Eye TID PRN Tobie Jorie SAUNDERS, MD       brimonidine  (ALPHAGAN ) 0.2 % ophthalmic solution 1 drop  1 drop Right Eye BID Tobie Jorie SAUNDERS, MD   1 drop at 04/15/24 1022   Chlorhexidine  Gluconate Cloth 2 % PADS 6 each  6 each Topical Daily Shalhoub, Zachary PARAS, MD       dextrose  5 %-0.9 % sodium chloride  infusion   Intravenous Continuous Blondie Lynwood POUR, NP 20 mL/hr at 04/15/24 0322 Infusion Verify at 04/15/24 9677   doxycycline  (VIBRA -TABS) tablet 100 mg  100 mg Oral Q12H Patel, Vishal R, MD   100 mg at 04/15/24 1022   feeding supplement (ENSURE PLUS HIGH PROTEIN) liquid 237 mL  237 mL Oral BID BM Patel, Vishal R, MD   237 mL at 04/15/24 1023   insulin  aspart (novoLOG ) injection 0-9 Units  0-9 Units Subcutaneous TID WC Tobie Jorie SAUNDERS, MD       liver oil-zinc  oxide (DESITIN) 40 % ointment   Topical BID Tobie Jorie SAUNDERS, MD   Given at 04/15/24 1022   meropenem  (MERREM ) 1 g in sodium chloride  0.9 % 100 mL IVPB  1 g Intravenous Q12H Patel, Vishal R, MD 200 mL/hr at 04/15/24 0949 1 g at 04/15/24 9050   metoprolol  tartrate (LOPRESSOR ) tablet 25 mg  25 mg Oral BID Patel, Vishal R, MD   25 mg at 04/15/24 1022   mirtazapine  (REMERON ) tablet 7.5 mg  7.5 mg Oral QHS Patel, Vishal R, MD       ondansetron  (ZOFRAN ) tablet 4 mg  4 mg Oral Q6H PRN Patel, Vishal R, MD       Or   ondansetron  (ZOFRAN ) injection 4 mg  4 mg Intravenous Q6H PRN Patel, Vishal R, MD       polyethylene glycol (MIRALAX  / GLYCOLAX ) packet 17  g  17 g Oral Daily Patel, Vishal R, MD       rosuvastatin  (CRESTOR ) tablet 40 mg  40 mg Oral Daily Patel, Vishal R, MD   40 mg at 04/15/24 1021   senna-docusate (Senokot-S) tablet 1 tablet  1 tablet Oral QHS PRN Tobie Jorie SAUNDERS, MD       sodium chloride  flush (NS) 0.9 % injection 10-40 mL  10-40 mL Intracatheter Q12H Shalhoub, Zachary PARAS, MD   10 mL at 04/15/24 1023   sodium chloride  flush (NS) 0.9 % injection 10-40 mL  10-40 mL Intracatheter PRN Shalhoub, Zachary PARAS, MD       sodium chloride  flush (NS) 0.9 % injection 3 mL  3 mL Intravenous Q12H Tobie Jorie R, MD   3 mL at 04/15/24 1023   traZODone  (DESYREL ) tablet 50 mg  50 mg Oral QHS PRN Patel, Vishal R, MD   50 mg at 04/15/24 0018    Allergies as of 04/14/2024 - Review Complete 04/14/2024  Allergen Reaction Noted   Versed  [midazolam ] Anxiety 08/06/2019   Augmentin [amoxicillin-pot clavulanate] Other (See Comments) 11/14/2021   Bactrim  [sulfamethoxazole -trimethoprim ] Other (See Comments) 08/06/2019   Demerol [meperidine]  03/30/2013   Meperidine hcl  01/29/2022   Scopolamine  05/12/2014   Tramadol  Other (See Comments) 04/27/2021    Family History  Problem Relation Age of Onset   Cancer - Prostate Father    Cancer - Colon Father    Stroke Mother    Hypertension Mother    Hyperlipidemia Mother    Melanoma Brother     Social History   Socioeconomic History   Marital status: Widowed  Spouse name: Not on file   Number of children: 3   Years of education: Not on file   Highest education level: Not on file  Occupational History   Not on file  Tobacco Use   Smoking status: Former    Current packs/day: 0.00    Types: Cigarettes    Quit date: 03/31/1972    Years since quitting: 52.0   Smokeless tobacco: Never  Vaping Use   Vaping status: Never Used  Substance and Sexual Activity   Alcohol  use: No   Drug use: No   Sexual activity: Not on file  Other Topics Concern   Not on file  Social History Narrative   Not on file    Social Drivers of Health   Financial Resource Strain: Not on file  Food Insecurity: No Food Insecurity (04/14/2024)   Hunger Vital Sign    Worried About Running Out of Food in the Last Year: Never true    Ran Out of Food in the Last Year: Never true  Transportation Needs: No Transportation Needs (04/14/2024)   PRAPARE - Administrator, Civil Service (Medical): No    Lack of Transportation (Non-Medical): No  Physical Activity: Not on file  Stress: Not on file  Social Connections: Moderately Isolated (04/14/2024)   Social Connection and Isolation Panel    Frequency of Communication with Friends and Family: More than three times a week    Frequency of Social Gatherings with Friends and Family: Once a week    Attends Religious Services: Never    Database administrator or Organizations: Yes    Attends Banker Meetings: Never    Marital Status: Widowed  Intimate Partner Violence: Not At Risk (04/14/2024)   Humiliation, Afraid, Rape, and Kick questionnaire    Fear of Current or Ex-Partner: No    Emotionally Abused: No    Physically Abused: No    Sexually Abused: No    Review of Systems: As per HPI, all others negative  Physical Exam: Vital signs in last 24 hours: Temp:  [97.5 F (36.4 C)-98.4 F (36.9 C)] 98.4 F (36.9 C) (07/23 0732) Pulse Rate:  [74-96] 85 (07/23 0732) Resp:  [16-18] 18 (07/23 0732) BP: (120-153)/(46-67) 120/46 (07/23 0732) SpO2:  [94 %-100 %] 100 % (07/23 0732) Weight:  [48.1 kg-49.9 kg] 48.1 kg (07/22 2321) Last BM Date : 04/14/24 General:   Alert,  elderly, frail- and cachectic-appearing, not acutely toxic-appearing Head:  Normocephalic and atraumatic. Eyes:  Sclera clear, no icterus.   Conjunctiva pale Ears:  Normal auditory acuity. Nose:  No deformity, discharge,  or lesions. Mouth:  No deformity or lesions.  Oropharynx pale and dry Neck:  Supple; no masses or thyromegaly. Lungs:  No visible respiratory distress Abdomen:   Soft, nontender and nondistended. No masses, hepatosplenomegaly or hernias noted. No guarding, and without rebound.     Msk:  Symmetrical without gross deformities. Normal posture. Pulses:  Normal pulses noted. Extremities:  Without clubbing or edema. Neurologic:  Alert and  oriented x4;  grossly normal neurologically. Skin:  Intact without significant lesions or rashes. Psych:  Alert and cooperative. Normal mood and affect.   Lab Results: Recent Labs    04/14/24 1601 04/15/24 0548  WBC 11.0* 9.9  HGB 6.0* 8.7*  HCT 19.3* 27.0*  PLT 217 174   BMET Recent Labs    04/14/24 1601 04/15/24 0548  NA 135 136  K 4.3 4.5  CL 105 106  CO2 22 23  GLUCOSE 86 108*  BUN 27* 23  CREATININE 1.03* 1.00  CALCIUM  8.5* 8.8*   LFT Recent Labs    04/14/24 1601  PROT 4.9*  ALBUMIN  1.9*  AST 34  ALT 21  ALKPHOS 135*  BILITOT 0.6   PT/INR Recent Labs    04/14/24 1601  LABPROT 35.7*  INR 3.4*    Studies/Results: No results found.  Impression:   Anemia.  Hemoccult positive stools.  No overt bleeding. 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 Paroxysmal atrial fibrillation on rivaroxaban . Sacral ulcers with osteomyelitis.  Plan:   Volume repletion, transfusion, follow CBCs, PPI. Hold anticoagulation at least for next 10 days.  Would carefully weigh risks/benefits prior to ever restarting her anticoagulation. Patient has chronic anemia for years; on top of this could have some worsening anemia from sacral ulcers.  Given no overt/destabilizing bleeding, I would not pursue any type of endoscopic evaluation.  Patient in agreement with this. [facility] will sign-off; please call with questions; thank you for the consultation.   LOS: 0 days   Sakura Denis M  04/15/2024, 10:46 AM  Cell [phone] If no answer or after 5 PM call [phone]

## 2024-04-15 NOTE — Assessment & Plan Note (Signed)
 Strict intake and output monitoring Creatinine near baseline Minimizing nephrotoxic agents as much as possible Serial chemistries to monitor renal function and electrolytes

## 2024-04-15 NOTE — Assessment & Plan Note (Signed)
 Patient exhibiting downtrending hemoglobin and hematocrit, likely multifactorial in origin Possibly secondary to ongoing infection superimposed on poor diet resulting in iron  deficiency anemia Anemia workup revealing iron  deficiency anemia Initiating Venofer  infusion Monitoring hemoglobin and hematocrit with serial CBCs Case additionally discussed with Dr. Burnette who evaluated the patient in the bedside, his input is appreciated.  Dr. Burnette recommended conservative management this time. Will advance diet as tolerated

## 2024-04-16 DIAGNOSIS — N1831 Chronic kidney disease, stage 3a: Secondary | ICD-10-CM | POA: Diagnosis not present

## 2024-04-16 DIAGNOSIS — R197 Diarrhea, unspecified: Secondary | ICD-10-CM | POA: Diagnosis not present

## 2024-04-16 DIAGNOSIS — E1122 Type 2 diabetes mellitus with diabetic chronic kidney disease: Secondary | ICD-10-CM | POA: Diagnosis not present

## 2024-04-16 DIAGNOSIS — L89154 Pressure ulcer of sacral region, stage 4: Secondary | ICD-10-CM | POA: Diagnosis not present

## 2024-04-16 DIAGNOSIS — D649 Anemia, unspecified: Secondary | ICD-10-CM | POA: Diagnosis not present

## 2024-04-16 DIAGNOSIS — M4628 Osteomyelitis of vertebra, sacral and sacrococcygeal region: Secondary | ICD-10-CM | POA: Diagnosis not present

## 2024-04-16 DIAGNOSIS — I480 Paroxysmal atrial fibrillation: Secondary | ICD-10-CM | POA: Diagnosis not present

## 2024-04-16 DIAGNOSIS — Z7401 Bed confinement status: Secondary | ICD-10-CM | POA: Diagnosis not present

## 2024-04-16 DIAGNOSIS — R531 Weakness: Secondary | ICD-10-CM | POA: Diagnosis not present

## 2024-04-16 DIAGNOSIS — C221 Intrahepatic bile duct carcinoma: Secondary | ICD-10-CM | POA: Diagnosis not present

## 2024-04-16 DIAGNOSIS — E785 Hyperlipidemia, unspecified: Secondary | ICD-10-CM | POA: Diagnosis not present

## 2024-04-16 DIAGNOSIS — E1169 Type 2 diabetes mellitus with other specified complication: Secondary | ICD-10-CM | POA: Diagnosis not present

## 2024-04-16 DIAGNOSIS — I739 Peripheral vascular disease, unspecified: Secondary | ICD-10-CM | POA: Diagnosis not present

## 2024-04-16 LAB — CBC WITH DIFFERENTIAL/PLATELET
Abs Immature Granulocytes: 0.03 K/uL (ref 0.00–0.07)
Basophils Absolute: 0.1 K/uL (ref 0.0–0.1)
Basophils Relative: 1 %
Eosinophils Absolute: 0.4 K/uL (ref 0.0–0.5)
Eosinophils Relative: 5 %
HCT: 28.9 % — ABNORMAL LOW (ref 36.0–46.0)
Hemoglobin: 9.2 g/dL — ABNORMAL LOW (ref 12.0–15.0)
Immature Granulocytes: 0 %
Lymphocytes Relative: 11 %
Lymphs Abs: 1 K/uL (ref 0.7–4.0)
MCH: 28 pg (ref 26.0–34.0)
MCHC: 31.8 g/dL (ref 30.0–36.0)
MCV: 88.1 fL (ref 80.0–100.0)
Monocytes Absolute: 1.2 K/uL — ABNORMAL HIGH (ref 0.1–1.0)
Monocytes Relative: 13 %
Neutro Abs: 6 K/uL (ref 1.7–7.7)
Neutrophils Relative %: 70 %
Platelets: 160 K/uL (ref 150–400)
RBC: 3.28 MIL/uL — ABNORMAL LOW (ref 3.87–5.11)
RDW: 20.1 % — ABNORMAL HIGH (ref 11.5–15.5)
WBC: 8.7 K/uL (ref 4.0–10.5)
nRBC: 0.2 % (ref 0.0–0.2)

## 2024-04-16 LAB — COMPREHENSIVE METABOLIC PANEL WITH GFR
ALT: 28 U/L (ref 0–44)
AST: 51 U/L — ABNORMAL HIGH (ref 15–41)
Albumin: 1.8 g/dL — ABNORMAL LOW (ref 3.5–5.0)
Alkaline Phosphatase: 150 U/L — ABNORMAL HIGH (ref 38–126)
Anion gap: 6 (ref 5–15)
BUN: 18 mg/dL (ref 8–23)
CO2: 23 mmol/L (ref 22–32)
Calcium: 8.4 mg/dL — ABNORMAL LOW (ref 8.9–10.3)
Chloride: 105 mmol/L (ref 98–111)
Creatinine, Ser: 1.03 mg/dL — ABNORMAL HIGH (ref 0.44–1.00)
GFR, Estimated: 54 mL/min — ABNORMAL LOW (ref >60)
Glucose, Bld: 89 mg/dL (ref 70–99)
Potassium: 4.2 mmol/L (ref 3.5–5.1)
Sodium: 134 mmol/L — ABNORMAL LOW (ref 135–145)
Total Bilirubin: 0.7 mg/dL (ref 0.0–1.2)
Total Protein: 4.6 g/dL — ABNORMAL LOW (ref 6.5–8.1)

## 2024-04-16 LAB — GLUCOSE, CAPILLARY
Glucose-Capillary: 76 mg/dL (ref 70–99)
Glucose-Capillary: 139 mg/dL — ABNORMAL HIGH (ref 70–99)
Glucose-Capillary: 124 mg/dL — ABNORMAL HIGH (ref 70–99)

## 2024-04-16 LAB — MAGNESIUM: Magnesium: 1.8 mg/dL (ref 1.7–2.4)

## 2024-04-16 MED ORDER — SODIUM CHLORIDE 0.9 % IV SOLN: 1.0000 g | Freq: Two times a day (BID) | INTRAVENOUS | Status: DC

## 2024-04-16 MED ORDER — FERROUS SULFATE 325 (65 FE) MG PO TBEC: 325.0000 mg | DELAYED_RELEASE_TABLET | Freq: Every day | ORAL | Status: DC

## 2024-04-16 MED ORDER — MIRTAZAPINE 15 MG PO TABS: 15.0000 mg | ORAL_TABLET | Freq: Every day | ORAL | Status: DC

## 2024-04-16 MED ORDER — DOXYCYCLINE HYCLATE 100 MG PO TABS: 100.0000 mg | ORAL_TABLET | Freq: Two times a day (BID) | ORAL | Status: DC

## 2024-04-16 MED ORDER — RIVAROXABAN 10 MG PO TABS: 10.0000 mg | ORAL_TABLET | Freq: Every day | ORAL | Status: DC

## 2024-04-16 NOTE — TOC Transition Note (Signed)
 Transition of Care [facility]) - Discharge Note   Patient Details  Name: [name] MRN: [id number] Date of Birth: March 29, 1941  Transition of Care [facility]) CM/SW Contact:  Toy LITTIE Agar, RN Phone Number:[phone]  04/16/2024, 3:24 PM   Clinical Narrative:    Transportation has been arranged via [facility]. Daughter Annabella Bouche has been updated on discharge.  TOC will sign off.  Nursing please call report to; [facility] [phone] Room # 1206P    Final next level of care: Skilled Nursing Facility Barriers to Discharge: No Barriers Identified   Patient Goals and CMS Choice            Discharge Placement                       Discharge Plan and Services Additional resources added to the After Visit Summary for   In-house Referral: NA Discharge Planning Services: CM Consult            DME Arranged: N/A DME Agency: NA       HH Arranged: NA HH Agency: NA        Social Drivers of Health (SDOH) Interventions SDOH Screenings   Food Insecurity: No Food Insecurity (04/14/2024)  Housing: Low Risk  (04/14/2024)  Transportation Needs: No Transportation Needs (04/14/2024)  Utilities: Not At Risk (04/14/2024)  Depression (PHQ2-9): Low Risk  (11/27/2021)  Social Connections: Moderately Isolated (04/14/2024)  Tobacco Use: Medium Risk (04/15/2024)     Readmission Risk Interventions    04/16/2024    3:13 PM  Readmission Risk Prevention Plan  Transportation Screening Complete  PCP or Specialist Appt within 3-5 Days Complete  HRI or Home Care Consult Complete  Social Work Consult for Recovery Care Planning/Counseling Complete  Medication Review Oceanographer) Referral to Pharmacy

## 2024-04-16 NOTE — Progress Notes (Signed)
 Mobility Specialist - Progress Note   04/16/24 1108  Mobility  Activity Ambulated with assistance in hallway  Level of Assistance Standby assist, set-up cues, supervision of patient - no hands on  Assistive Device Front wheel walker  Distance Ambulated (ft) 160 ft  Activity Response Tolerated well  Mobility Referral Yes  Mobility visit 1 Mobility  Mobility Specialist Start Time (ACUTE ONLY) 1057  Mobility Specialist Stop Time (ACUTE ONLY) 1107  Mobility Specialist Time Calculation (min) (ACUTE ONLY) 10 min   Pt received in bed and agreeable to mobility. No complaints during session. Pt to EOB after session with all needs met. NT in room.     [facility]

## 2024-04-16 NOTE — NC FL2 (Signed)
 [place]  MEDICAID FL2 LEVEL OF CARE FORM     IDENTIFICATION  Patient Name: [name] Birthdate: 24-Mar-1941 Sex: female Admission Date (Current Location): 04/14/2024  [facility] and [place] Number:  Producer, television/film/video and Address:  [facility],  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   Sacral wound 04/15/2024   Osteomyelitis of sacrum (HCC) 04/15/2024   Acute diarrhea 04/15/2024   Acute on chronic anemia 04/14/2024   Paroxysmal atrial fibrillation (HCC) 04/14/2024   Chronic kidney disease, stage 3a (HCC) 04/14/2024   Hyperlipidemia associated with type 2 diabetes mellitus (HCC) 04/14/2024   Severe sepsis (HCC) 02/12/2024   Pneumonia 02/12/2024   Transaminitis 02/12/2024   Atrial fibrillation with RVR (HCC) 02/12/2024   Metabolic acidosis 02/12/2024   Port-A-Cath in place 05/02/2023   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   Coronary artery disease involving native heart without angina pectoris 04/05/2023   Jaundice 04/04/2023   Cholelithiasis 10/12/2022   Symptomatic cholelithiasis 10/12/2022   Family history of malignant neoplasm of digestive organ 02/17/2022   Gangrenous disorder (HCC) 02/17/2022   Gastro-esophageal reflux disease without esophagitis 02/17/2022   Hyperglycemia due to type 2 diabetes mellitus (HCC) 02/17/2022   Iron  deficiency anemia 02/17/2022   Leukocytosis 02/17/2022   Moderate nonproliferative diabetic retinopathy of both eyes without macular edema associated  with type 2 diabetes mellitus (HCC) 02/17/2022   Other acute osteomyelitis, left ankle and foot (HCC) 02/17/2022   Pure hypercholesterolemia 02/17/2022   Skin ulcer of sacrum, limited to breakdown of skin (HCC) 02/17/2022   Thrombocytopenic disorder (HCC) 02/17/2022   Tinnitus 02/17/2022   S/P angiogram of extremity 10/13/2021   Osteomyelitis of foot, left, acute (HCC) 04/27/2021   Cellulitis in diabetic foot (HCC) 04/27/2021   AKI (acute kidney injury) (HCC) 04/27/2021   Hyponatremia 04/27/2021   Symptomatic anemia 04/27/2021   Cellulitis of left lower extremity    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   Critical lower limb ischemia (HCC) 08/03/2019   Encounter for immunization 07/09/2016   Essential hypertension 03/31/2013   Dyslipidemia 03/31/2013   Type 2 diabetes mellitus with stage 3a chronic kidney disease, without long-term current use of insulin  (HCC) 03/31/2013   Overweight (BMI 25.0-29.9) 03/31/2013   Carotid artery disease (HCC) 03/31/2013   PAD (peripheral artery disease) (HCC) 03/31/2013    Orientation RESPIRATION BLADDER Height & Weight     Self, Time, Situation, Place  Normal Incontinent (at times) Weight: 48.1 kg Height:  5' 5 (165.1 cm)  BEHAVIORAL SYMPTOMS/MOOD NEUROLOGICAL BOWEL NUTRITION STATUS     (n/a) Continent Diet (soft)  AMBULATORY STATUS COMMUNICATION OF NEEDS Skin   Limited Assist Verbally Other (Comment)  (stage 2 redness noted to bilateral buttocks)                       Personal Care Assistance Level of Assistance  Bathing, Feeding, Dressing Bathing Assistance: Limited assistance Feeding assistance: Independent Dressing Assistance: Limited assistance     Functional Limitations Info  Sight, Hearing, Speech Sight Info: Impaired (eyeglasses) Hearing Info: Adequate Speech Info: Adequate    SPECIAL CARE FACTORS FREQUENCY  PT (By licensed PT), OT (By licensed OT)     PT Frequency: 5x/wk OT Frequency: 5x/wk            Contractures Contractures Info: Not present    Additional Factors Info  Code Status, Allergies Code Status Info: Full Allergies Info: Versed  (Midazolam ), Bactrim  (Sulfamethoxazole -trimethoprim ), Augmentin (Amoxicillin-pot Clavulanate), Demerol (Meperidine), Meperidine Hcl, Scopolamine, Tramadol            Current Medications (04/16/2024):  This is the current hospital active medication list Current Facility-Administered Medications  Medication Dose Route Frequency Provider Last Rate Last Admin   acetaminophen  (TYLENOL ) tablet 650 mg  650 mg Oral Q6H PRN Patel, Vishal R, MD       Or   acetaminophen  (TYLENOL ) suppository 650 mg  650 mg Rectal Q6H PRN Patel, Vishal R, MD       acidophilus (RISAQUAD) capsule 1 capsule  1 capsule Oral Daily Shalhoub, Zachary PARAS, MD   1 capsule at 04/16/24 0938   artificial tears ophthalmic solution 1 drop  1 drop Left Eye TID PRN Patel, Vishal R, MD       brimonidine  (ALPHAGAN ) 0.2 % ophthalmic solution 1 drop  1 drop Right Eye BID Tobie Jorie SAUNDERS, MD   1 drop at 04/16/24 9061   Chlorhexidine  Gluconate Cloth 2 % PADS 6 each  6 each Topical Daily Shalhoub, Zachary PARAS, MD   6 each at 04/16/24 0938   doxycycline  (VIBRA -TABS) tablet 100 mg  100 mg Oral Q12H Patel, Vishal R, MD   100 mg at 04/16/24 9061   feeding supplement (ENSURE PLUS HIGH PROTEIN) liquid 237 mL  237 mL Oral BID BM Patel, Vishal R, MD   237 mL at 04/16/24 9061    insulin  aspart (novoLOG ) injection 0-9 Units  0-9 Units Subcutaneous TID WC Patel, Vishal R, MD   1 Units at 04/16/24 1205   irbesartan  (AVAPRO ) tablet 150 mg  150 mg Oral Daily Shalhoub, George J, MD   150 mg at 04/16/24 9061   liver oil-zinc  oxide (DESITIN) 40 % ointment   Topical BID Patel, Vishal R, MD   Given at 04/16/24 9061   meropenem  (MERREM ) 1 g in sodium chloride  0.9 % 100 mL IVPB  1 g Intravenous Q12H Patel, Vishal R, MD 200 mL/hr at 04/16/24 0937 1 g at 04/16/24 9062   metoprolol  tartrate (LOPRESSOR ) tablet 25 mg  25 mg Oral BID Patel, Vishal R, MD   25 mg at 04/16/24 9061   mirtazapine  (REMERON ) tablet 15 mg  15 mg Oral QHS Patel, Vishal R, MD   15 mg at 04/15/24 2101   ondansetron  (ZOFRAN ) tablet 4 mg  4 mg Oral Q6H PRN Patel, Vishal R, MD       Or   ondansetron  (ZOFRAN ) injection 4 mg  4 mg Intravenous Q6H PRN Tobie Jorie SAUNDERS, MD  pantoprazole  (PROTONIX ) EC tablet 20 mg  20 mg Oral Daily Shalhoub, George J, MD   20 mg at 04/16/24 9061   polyethylene glycol (MIRALAX  / GLYCOLAX ) packet 17 g  17 g Oral Daily Patel, Vishal R, MD   17 g at 04/16/24 9061   rosuvastatin  (CRESTOR ) tablet 40 mg  40 mg Oral Daily Patel, Vishal R, MD   40 mg at 04/16/24 9061   senna-docusate (Senokot-S) tablet 1 tablet  1 tablet Oral QHS PRN Patel, Vishal R, MD       sodium chloride  flush (NS) 0.9 % injection 10-40 mL  10-40 mL Intracatheter Q12H Shalhoub, Zachary PARAS, MD   10 mL at 04/16/24 9061   sodium chloride  flush (NS) 0.9 % injection 10-40 mL  10-40 mL Intracatheter PRN Kenard Zachary PARAS, MD       sodium chloride  flush (NS) 0.9 % injection 3 mL  3 mL Intravenous Q12H Patel, Vishal R, MD   3 mL at 04/16/24 9060   traZODone  (DESYREL ) tablet 100 mg  100 mg Oral QHS Shalhoub, George J, MD       traZODone  (DESYREL ) tablet 50 mg  50 mg Oral QHS PRN Patel, Vishal R, MD   50 mg at 04/15/24 0018     Discharge Medications: Please see discharge summary for a list of discharge medications.  Relevant  Imaging Results:  Relevant Lab Results:   Additional Information SS# [id number]  Toy LITTIE Agar, RN

## 2024-04-16 NOTE — Progress Notes (Signed)
 Dressing to buttocks complete at this time per order.

## 2024-04-16 NOTE — TOC Initial Note (Signed)
 Transition of Care [facility]) - Initial/Assessment Note    Patient Details  Name: [name] MRN: [id number] Date of Birth: [birth year]  [facility] CM/SW Contact:    Toy LITTIE Agar, RN Phone Number:[phone]  04/16/2024, 3:22 PM  Clinical Narrative:                 TOC following patient admitted from [facility]. FL2 completed. CM spoke with [place] to confirm that patient is able to return to [place]. Discharge summary and transfer report have been faxed to [facility].  Currently there are no TOC needs.   Expected Discharge Plan: Skilled Nursing Facility Barriers to Discharge: No Barriers Identified   Patient Goals and CMS Choice            Expected Discharge Plan and Services In-house Referral: NA Discharge Planning Services: CM Consult   Living arrangements for the past 2 months: Skilled Nursing Facility Expected Discharge Date: 04/16/24               DME Arranged: N/A DME Agency: NA       HH Arranged: NA HH Agency: NA        Prior Living Arrangements/Services Living arrangements for the past 2 months: Skilled Nursing Facility Lives with:: Facility Resident Patient language and need for interpreter reviewed:: Yes Do you feel safe going back to the place where you live?: Yes      Need for Family Participation in Patient Care: Yes (Comment) Care giver support system in place?: Yes (comment) Current home services: DME (has cane rolling walker and rollator) Criminal Activity/Legal Involvement Pertinent to Current Situation/Hospitalization: No - Comment as needed  Activities of Daily Living   ADL Screening (condition at time of admission) Independently performs ADLs?: Yes (appropriate for developmental age) Is the patient deaf or have difficulty hearing?: No Does the patient have difficulty seeing, even when wearing glasses/contacts?: No Does the patient have difficulty concentrating, remembering, or making decisions?: No  Permission  Sought/Granted Permission sought to share information with : Family Supports Permission granted to share information with : No              Emotional Assessment Appearance:: Appears stated age Attitude/Demeanor/Rapport: Gracious Affect (typically observed): Pleasant Orientation: : Oriented to Self, Oriented to Place, Oriented to  Time, Oriented to Situation Alcohol  / Substance Use: Not Applicable Psych Involvement: No (comment)  Admission diagnosis:  Symptomatic anemia [D64.9] Acute on chronic anemia [D64.9] Anemia [D64.9] 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   Pressure injury of sacral region, stage 4 (HCC) 04/15/2024   Osteomyelitis of sacrum (HCC) 04/15/2024   Acute diarrhea 04/15/2024   Acute on chronic anemia 04/14/2024   Paroxysmal atrial fibrillation (HCC) 04/14/2024   Chronic kidney disease, stage 3a (HCC) 04/14/2024   Hyperlipidemia associated with type 2 diabetes mellitus (HCC) 04/14/2024   Severe sepsis (HCC) 02/12/2024   Pneumonia 02/12/2024   Transaminitis 02/12/2024   Atrial fibrillation with RVR (HCC) 02/12/2024   Metabolic acidosis 02/12/2024   Port-A-Cath in place 05/02/2023   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   Coronary artery disease involving native heart without angina pectoris 04/05/2023   Jaundice 04/04/2023   Cholelithiasis 10/12/2022   Symptomatic cholelithiasis 10/12/2022   Family history of malignant neoplasm of digestive organ 02/17/2022   Gangrenous disorder (HCC) 02/17/2022   Gastro-esophageal reflux disease without esophagitis 02/17/2022   Hyperglycemia due to type 2 diabetes mellitus (HCC) 02/17/2022   Iron   deficiency anemia 02/17/2022   Leukocytosis 02/17/2022   Moderate nonproliferative diabetic retinopathy of both eyes without macular edema associated with type 2 diabetes mellitus (HCC) 02/17/2022   Other acute osteomyelitis, left ankle and foot (HCC) 02/17/2022    Pure hypercholesterolemia 02/17/2022   Skin ulcer of sacrum, limited to breakdown of skin (HCC) 02/17/2022   Thrombocytopenic disorder (HCC) 02/17/2022   Tinnitus 02/17/2022   S/P angiogram of extremity 10/13/2021   Osteomyelitis of foot, left, acute (HCC) 04/27/2021   Cellulitis in diabetic foot (HCC) 04/27/2021   AKI (acute kidney injury) (HCC) 04/27/2021   Hyponatremia 04/27/2021   Symptomatic anemia 04/27/2021   Cellulitis of left lower extremity    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 [place] [address] Phone: [phone] Fax: [phone]  [facility] [address] [place] [address] Phone: [phone] Fax: [phone]  [facility] [place], [place] - [address] [place] [address] [place] [address] [place] [address] Phone: [phone] Fax:  [phone]     Social Drivers of Health (SDOH) Social History: SDOH Screenings   Food Insecurity: No Food Insecurity (04/14/2024)  Housing: Low Risk  (04/14/2024)  Transportation Needs: No Transportation Needs (04/14/2024)  Utilities: Not At Risk (04/14/2024)  Depression (PHQ2-9): Low Risk  (11/27/2021)  Social Connections: Moderately Isolated (04/14/2024)  Tobacco Use: Medium Risk (04/15/2024)   SDOH Interventions:     Readmission Risk Interventions    04/16/2024    3:13 PM  Readmission Risk Prevention Plan  Transportation Screening Complete  PCP or Specialist Appt within 3-5 Days Complete  HRI or Home Care Consult Complete  Social Work Consult for Recovery Care Planning/Counseling Complete  Medication Review Oceanographer) Referral to Pharmacy

## 2024-04-16 NOTE — Discharge Instructions (Addendum)
 Patient has been placed on daily iron  supplementation that should be taken daily before breakfast on an empty stomach, preferably with a  cup of orange juice. Patient can be resumed on a reduced dose of Xarelto  at 10 mg once daily on 04/25/2024.   Check patients hemoglobin and hematocrit in 2 weeks.  Check Iron  levels in 4-6 weeks.  Patient should resume her antibiotic regimen of IV Meropenem  and PO Doxycycline  as previously ordered.  Patient is full code  Patient may get out of bed as tolerated Patient to be administered all prescribed medications exactly as instructed. Patient receives a low carbohydrate, low-sodium low carbohydrate diet Follow-up with facility provider per protocol.   Wound Care Instructions:  Cleanse sacral/buttocks wounds with Vashe wound cleanserdo not rinse and allow to air dry.  Using a Q tip applicator apply Calcium  Alginate to wound bed daily making sure to cover any areas of depth and fill in wound bed.  Will likely take several sheets of the alginate. Cover with dry gauze and silicone foam or ABD pad whichever is preferred Please bring patient back to the emergency department if patient develops sudden changes in mentation, shortness of breath,  fevers of greater than 100.4 F or weakness.

## 2024-04-16 NOTE — Discharge Summary (Addendum)
 Physician Discharge Summary   Patient: MUNIRA POLSON MRN: [id number] DOB: 09-20-1941  Admit date:     04/14/2024  Discharge date: 04/16/24  Discharge Physician: Zachary JINNY Ba   PCP: Chrystal Lamarr RAMAN, MD   Recommendations at discharge:   Patient has been placed on daily iron  supplementation that should be taken daily before breakfast on an empty stomach, preferably with a  cup of orange juice. Patient can be resumed on a reduced dose of Xarelto  at 10 mg once daily on 04/25/2024.   Check patients hemoglobin and hematocrit in 2 weeks.  Check Iron  levels in 4-6 weeks.  Patient should resume her antibiotic regimen of IV Meropenem  and PO Doxycycline  as previously ordered.  Patient is full code  Patient may get out of bed as tolerated Patient to be administered all prescribed medications exactly as instructed. Patient receives a low carbohydrate, low-sodium low carbohydrate diet Follow-up with facility provider per protocol.   Wound Care Instructions:  Cleanse sacral/buttocks wounds with Vashe wound cleanserdo not rinse and allow to air dry.  Using a Q tip applicator apply Calcium  Alginate to wound bed daily making sure to cover any areas of depth and fill in wound bed.  Will likely take several sheets of the alginate. Cover with dry gauze and silicone foam or ABD pad whichever is preferred Please bring patient back to the emergency department if patient develops sudden changes in mentation, shortness of breath,  fevers of greater than 100.4 F or weakness.    Discharge Diagnoses: Principal Problem:   Acute on chronic anemia Active Problems:   Type 2 diabetes mellitus with stage 3a chronic kidney disease, without long-term current use of insulin  (HCC)   PAD (peripheral artery disease) (HCC)   Coronary artery disease involving native heart without angina pectoris   Gallbladder cancer (HCC)   Paroxysmal atrial fibrillation (HCC)   Chronic kidney disease, stage 3a (HCC)   Hyperlipidemia  associated with type 2 diabetes mellitus (HCC)   Anemia   Cholangiocarcinoma (HCC)   Pressure injury of sacral region, stage 4 (HCC)   Osteomyelitis of sacrum (HCC)   Acute diarrhea  Resolved Problems:   * No resolved hospital problems. *   Hospital Course: CAMEKA RAE is a 83 y.o. 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  On evaluation in the emergency department patient Xarelto  was held.  Patient's Hemoccult was found to be positive.  2 units of packed red blood cells were administered.  Patient's hemoglobin increased appropriately.  Patient was monitored closely over the course of the hospitalization without any clinical evidence of further bleeding.  Dr. Burnette with gastroenterology was consulted.  Considering patient's advanced age and numerous comorbidities it was recommended that conservative management be maintained.  He recommended holding off on resuming Xarelto  for 10 days before considering resumption.  Considering the etiology of the patient's anemia, this was felt to be multifactorial in origin secondary to poor oral intake, with substantial iron  deficiency and intermittent small amounts of bleeding from the patient's stage IV sacral decubitus ulcer.  Patient was monitored for an additional 24 hours without any further evidence of bleeding.  Hemoglobin and hematocrit continued to be stable.  Patient was resumed on Xarelto  at a  reduced dose of 10 mg by mouth once daily.  Patient was discharged back to her skilled nursing facility, [facility] in improved and stable condition on 04/16/2024.    Pain control - [place]   Controlled Substance Reporting System database was reviewed. and patient was instructed, not to drive, operate heavy machinery, perform activities at heights, swimming or participation in water  activities or provide baby-sitting services while on Pain, Sleep and Anxiety Medications; until their outpatient Physician has advised to do so again. Also recommended to not to take more than prescribed Pain, Sleep and Anxiety Medications.   Consultants: Dr. Burnette with Gasteroenterology Procedures performed: none  Disposition: Skilled nursing facility Diet recommendation:  Discharge Diet Orders (From admission, onward)     Start     Ordered   04/16/24 0000  Diet - low sodium heart healthy        04/16/24 1434           Cardiac diet  DISCHARGE MEDICATION: Allergies as of 04/16/2024       Reactions   Versed  [midazolam ] Anxiety   Frantic, out of my mind, agitated    Bactrim  [sulfamethoxazole -trimethoprim ] Other (See Comments)   ^ K+( elevated)    Augmentin [amoxicillin-pot Clavulanate] Other (See Comments)   Elevates potassium level   Demerol [meperidine]    Delusional    Meperidine Hcl    Other reaction(s): delusional   Scopolamine    Delusional  Patch *   Tramadol  Other (See Comments)   Keeps awake        Medication List     TAKE these medications    acetaminophen  500 MG tablet Commonly known as: TYLENOL  Take 1,000 mg by mouth every 6 (six) hours as needed for moderate pain (pain score 4-6), fever or mild pain (pain score 1-3).   acidophilus Caps capsule Take 1 capsule by mouth daily.   brimonidine  0.2 % ophthalmic solution Commonly known as: ALPHAGAN  Place 1 drop into the right eye 2 (two) times daily.   doxycycline  100 MG tablet Commonly known as: VIBRA -TABS Take 1 tablet (100 mg total) by mouth every 12 (twelve) hours.   feeding supplement Liqd Take 237 mLs by mouth 2 (two) times daily between meals.   ferrous sulfate  325 (65 FE) MG EC tablet Take 1 tablet  (325 mg total) by mouth daily before breakfast. To be taken with a cup of orange juice on an otherwise empty stomach if possible   meropenem  1 g in sodium chloride  0.9 % 100 mL Inject 1 g into the vein every 12 (twelve) hours.   metoprolol  tartrate 25 MG tablet Commonly known as: LOPRESSOR  Take 1 tablet (25 mg total) by mouth 2 (two) times daily.   mirtazapine  15 MG tablet Commonly known as: REMERON  Take 1 tablet (15 mg total) by mouth at bedtime. What changed:  medication strength how much to take   pantoprazole  20 MG tablet Commonly known as: PROTONIX  Take 20 mg by mouth daily.   polyethylene glycol 17 g packet Commonly known as: MIRALAX  / GLYCOLAX  Take 17 g by mouth daily.   rivaroxaban  10 MG Tabs tablet Commonly known as: XARELTO  Take 1 tablet (10 mg total) by mouth daily. Start taking on: April 25, 2024   rosuvastatin  40 MG tablet Commonly known as: CRESTOR  Take 1 tablet (40 mg total) by mouth daily.   Systane Balance 0.6 % Soln Generic drug: Propylene Glycol Place 1 drop into the left eye 3 (three) times daily as needed (dry/irritated eyes).  telmisartan  40 MG tablet Commonly known as: MICARDIS  Take 40 mg by mouth daily.   traZODone  100 MG tablet Commonly known as: DESYREL  Take 100 mg by mouth at bedtime.               Discharge Care Instructions  (From admission, onward)           Start     Ordered   04/16/24 0000  Discharge wound care:       Comments: Cleanse sacral/buttocks wounds with Vashe wound cleanserdo not rinse and allow to air dry.  Using a Q tip applicator apply Calcium  Alginate to wound bed daily making sure to cover any areas of depth and fill in wound bed.  Will likely take several sheets of the alginate. Cover with dry gauze and silicone foam or ABD pad whichever is preferred   04/16/24 1434            Follow-up Information     [facility].   Specialty: Skilled Nursing Facility Contact information: [address] [place] [address] [phone]                 Discharge Exam: Fredricka Weights   04/14/24 1459 04/14/24 2321  Weight: 49.9 kg 48.1 kg    Constitutional: Awake alert and oriented x3, no associated distress.   Respiratory: clear to auscultation bilaterally, no wheezing, no crackles. Normal respiratory effort. No accessory muscle use.  Cardiovascular: Regular rate and rhythm, no murmurs / rubs / gallops. No extremity edema. 2+ pedal pulses. No carotid bruits.  Abdomen: Abdomen is soft and nontender.  No evidence of intra-abdominal masses.  Positive bowel sounds noted in all quadrants.   Musculoskeletal: No joint deformity upper and lower extremities. Good ROM, no contractures. Normal muscle tone.     Condition at discharge: fair  The results of significant diagnostics from this hospitalization (including imaging, microbiology, ancillary and laboratory) are listed below for reference.   Imaging Studies: MR SACRUM SI JOINTS W WO CONTRAST Result Date: 03/19/2024 EXAM DESCRIPTION: MR SACRUM SI JOINTS W WO CONTRAST CLINICAL HISTORY: 83 years, Female, osteomyelitis. COMPARISON: None TECHNIQUE: MRI of the sacrum and SI joints was performed with multiplanar multi sequence imaging according to our usual protocol. FINDINGS: There is a soft tissue ulceration overlying the lower coccyx/inferior to the right aspect of the coccyx. Moderate phlegmonous changes. The wound dissects cranially and laterally into the right gluteal musculature where there is a curvilinear fluid collection containing blebs of gas. This measures 6.6 x 1.9 cm in axial dimension and appears to be draining through the skin surface. Moderate associated myositis. Moderate diffuse posterior subcutaneous edema. There is mild marrow edema to the underlying distal coccyx likely reflecting mild osteomyelitis. The marrow signal is otherwise unremarkable. IMPRESSION: Decubitus ulcer to the right of the coccyx and extending  inferiorly. There is also cranial and lateral extension towards the right gluteus maximus musculature where there is a curvilinear abscess measuring 6.6 x 1.9 cm in axial dimension. This is draining through the skin surface. Moderate associated myositis. The wound dissects towards the inferior coccyx where there is mild marrow edema likely reflecting mild osteomyelitis. Moderate subcutaneous edema posteriorly suggesting cellulitis. Electronically signed by: Reyes Frees MD 03/19/2024 09:46 PM EDT RP Workstation: [id number]    Microbiology: Results for orders placed or performed during the hospital encounter of 02/12/24  Blood Culture (routine x 2)     Status: None   Collection Time: 02/12/24  1:25 PM   Specimen:  BLOOD RIGHT FOREARM  Result Value Ref Range Status   Specimen Description BLOOD RIGHT FOREARM  Final   Special Requests   Final    BOTTLES DRAWN AEROBIC AND ANAEROBIC Blood Culture adequate volume   Culture   Final    NO GROWTH 5 DAYS Performed at [facility], [address], [place], [place] [address]    Report Status 02/17/2024 FINAL  Final  Blood Culture (routine x 2)     Status: None   Collection Time: 02/12/24  1:32 PM   Specimen: BLOOD LEFT FOREARM  Result Value Ref Range Status   Specimen Description BLOOD LEFT FOREARM  Final   Special Requests   Final    BOTTLES DRAWN AEROBIC AND ANAEROBIC Blood Culture adequate volume   Culture   Final    NO GROWTH 5 DAYS Performed at [facility], [address] [place], [place] [address]    Report Status 02/17/2024 FINAL  Final  Resp panel by RT-PCR (RSV, Flu A&B, Covid) Anterior Nasal Swab     Status: None   Collection Time: 02/12/24  1:39 PM   Specimen: Anterior Nasal Swab  Result Value Ref Range Status   SARS Coronavirus 2 by RT PCR NEGATIVE NEGATIVE Final   Influenza A by PCR NEGATIVE NEGATIVE Final   Influenza B by PCR NEGATIVE NEGATIVE Final    Comment: (NOTE) The Xpert Xpress SARS-CoV-2/FLU/RSV plus  assay is intended as an aid in the diagnosis of influenza from Nasopharyngeal swab specimens and should not be used as a sole basis for treatment. Nasal washings and aspirates are unacceptable for Xpert Xpress SARS-CoV-2/FLU/RSV testing.  Fact Sheet for Patients: BloggerCourse.com  Fact Sheet for Healthcare Providers: SeriousBroker.it  This test is not yet approved or cleared by the [place]  FDA and has been authorized for detection and/or diagnosis of SARS-CoV-2 by FDA under an Emergency Use Authorization (EUA). This EUA will remain in effect (meaning this test can be used) for the duration of the COVID-19 declaration under Section 564(b)(1) of the Act, 21 U.S.C. section 360bbb-3(b)(1), unless the authorization is terminated or revoked.     Resp Syncytial Virus by PCR NEGATIVE NEGATIVE Final    Comment: (NOTE) Fact Sheet for Patients: BloggerCourse.com  Fact Sheet for Healthcare Providers: SeriousBroker.it  This test is not yet approved or cleared by the [place]  FDA and has been authorized for detection and/or diagnosis of SARS-CoV-2 by FDA under an Emergency Use Authorization (EUA). This EUA will remain in effect (meaning this test can be used) for the duration of the COVID-19 declaration under Section 564(b)(1) of the Act, 21 U.S.C. section 360bbb-3(b)(1), unless the authorization is terminated or revoked.  Performed at [facility], [address] [place], [place] [address]   Urine Culture     Status: Abnormal   Collection Time: 02/12/24  1:50 PM   Specimen: Urine, Random  Result Value Ref Range Status   Specimen Description URINE, RANDOM  Final   Special Requests   Final    NONE Reflexed from [id number] Performed at [facility], [address] [place], [place] [address]    Culture 70,000 COLONIES/mL KLEBSIELLA PNEUMONIAE (A)  Final   Report  Status 02/15/2024 FINAL  Final   Organism ID, Bacteria KLEBSIELLA PNEUMONIAE (A)  Final      Susceptibility   Klebsiella pneumoniae - MIC*    AMPICILLIN >=32 RESISTANT Resistant     CEFAZOLIN  <=4 SENSITIVE Sensitive     CEFEPIME  <=  0.12 SENSITIVE Sensitive     CEFTRIAXONE  <=0.25 SENSITIVE Sensitive     CIPROFLOXACIN  <=0.25 SENSITIVE Sensitive     GENTAMICIN <=1 SENSITIVE Sensitive     IMIPENEM <=0.25 SENSITIVE Sensitive     NITROFURANTOIN <=16 SENSITIVE Sensitive     TRIMETH /SULFA  <=20 SENSITIVE Sensitive     AMPICILLIN/SULBACTAM 4 SENSITIVE Sensitive     PIP/TAZO <=4 SENSITIVE Sensitive ug/mL    * 70,000 COLONIES/mL KLEBSIELLA PNEUMONIAE   *Note: Due to a large number of results and/or encounters for the requested time period, some results have not been displayed. A complete set of results can be found in Results Review.    Labs: CBC: Recent Labs  Lab 04/14/24 1601 04/15/24 0548 04/15/24 1400 04/16/24 0525  WBC 11.0* 9.9  --  8.7  NEUTROABS 8.8*  --   --  6.0  HGB 6.0* 8.7* 7.8* 9.2*  HCT 19.3* 27.0* 24.7* 28.9*  MCV 92.8 87.1  --  88.1  PLT 217 174  --  160   Basic Metabolic Panel: Recent Labs  Lab 04/14/24 1601 04/15/24 0548 04/16/24 0525  NA 135 136 134*  K 4.3 4.5 4.2  CL 105 106 105  CO2 22 23 23   GLUCOSE 86 108* 89  BUN 27* 23 18  CREATININE 1.03* 1.00 1.03*  CALCIUM  8.5* 8.8* 8.4*  MG  --   --  1.8   Liver Function Tests: Recent Labs  Lab 04/14/24 1601 04/16/24 0525  AST 34 51*  ALT 21 28  ALKPHOS 135* 150*  BILITOT 0.6 0.7  PROT 4.9* 4.6*  ALBUMIN  1.9* 1.8*   CBG: Recent Labs  Lab 04/15/24 1124 04/15/24 1630 04/15/24 2059 04/16/24 0713 04/16/24 1107  GLUCAP 80 130* 175* 76 139*    Discharge time spent: greater than 30 minutes.  Signed: Zachary JINNY Ba, MD [facility] 04/16/2024

## 2024-04-20 DIAGNOSIS — D631 Anemia in chronic kidney disease: Secondary | ICD-10-CM | POA: Diagnosis not present

## 2024-04-20 DIAGNOSIS — R634 Abnormal weight loss: Secondary | ICD-10-CM | POA: Diagnosis not present

## 2024-04-20 DIAGNOSIS — K529 Noninfective gastroenteritis and colitis, unspecified: Secondary | ICD-10-CM | POA: Diagnosis not present

## 2024-04-20 DIAGNOSIS — L89150 Pressure ulcer of sacral region, unstageable: Secondary | ICD-10-CM | POA: Diagnosis not present

## 2024-04-20 DIAGNOSIS — I10 Essential (primary) hypertension: Secondary | ICD-10-CM | POA: Diagnosis not present

## 2024-04-20 DIAGNOSIS — C23 Malignant neoplasm of gallbladder: Secondary | ICD-10-CM | POA: Diagnosis not present

## 2024-04-20 DIAGNOSIS — M4628 Osteomyelitis of vertebra, sacral and sacrococcygeal region: Secondary | ICD-10-CM | POA: Diagnosis not present

## 2024-04-20 DIAGNOSIS — Z7901 Long term (current) use of anticoagulants: Secondary | ICD-10-CM | POA: Diagnosis not present

## 2024-04-20 DIAGNOSIS — Z681 Body mass index (BMI) 19 or less, adult: Secondary | ICD-10-CM | POA: Diagnosis not present

## 2024-04-20 DIAGNOSIS — N189 Chronic kidney disease, unspecified: Secondary | ICD-10-CM | POA: Diagnosis not present

## 2024-04-20 DIAGNOSIS — I480 Paroxysmal atrial fibrillation: Secondary | ICD-10-CM | POA: Diagnosis not present

## 2024-04-20 DIAGNOSIS — E1122 Type 2 diabetes mellitus with diabetic chronic kidney disease: Secondary | ICD-10-CM | POA: Diagnosis not present

## 2024-04-23 ENCOUNTER — Ambulatory Visit (HOSPITAL_COMMUNITY)
Admission: RE | Admit: 2024-04-23 | Discharge: 2024-04-23 | Disposition: A | Discharge status: Home / Self Care | Source: Ambulatory Visit | Attending: Hematology | Admitting: Hematology

## 2024-04-23 DIAGNOSIS — C23 Malignant neoplasm of gallbladder: Secondary | ICD-10-CM | POA: Diagnosis not present

## 2024-04-23 DIAGNOSIS — I700 Atherosclerosis of aorta: Secondary | ICD-10-CM | POA: Diagnosis not present

## 2024-04-23 DIAGNOSIS — C787 Secondary malignant neoplasm of liver and intrahepatic bile duct: Secondary | ICD-10-CM | POA: Diagnosis not present

## 2024-04-23 DIAGNOSIS — C249 Malignant neoplasm of biliary tract, unspecified: Secondary | ICD-10-CM | POA: Diagnosis not present

## 2024-04-23 MED ORDER — IOHEXOL 9 MG/ML PO SOLN
ORAL | Status: AC
  Filled 2024-04-23: qty 1000

## 2024-04-23 MED ORDER — HEPARIN SOD (PORK) LOCK FLUSH 100 UNIT/ML IV SOLN
500.0000 [IU] | Freq: Once | INTRAVENOUS | Status: AC
  Administered 2024-04-23: 500 [IU] via INTRAVENOUS

## 2024-04-23 MED ORDER — IOHEXOL 9 MG/ML PO SOLN: 1000.0000 mL | ORAL | Status: AC

## 2024-04-23 MED ORDER — IOHEXOL 300 MG/ML  SOLN
80.0000 mL | Freq: Once | INTRAMUSCULAR | Status: AC | PRN
  Administered 2024-04-23: 80 mL via INTRAVENOUS

## 2024-04-24 DIAGNOSIS — R600 Localized edema: Secondary | ICD-10-CM | POA: Diagnosis not present

## 2024-04-24 DIAGNOSIS — E43 Unspecified severe protein-calorie malnutrition: Secondary | ICD-10-CM | POA: Diagnosis present

## 2024-04-24 DIAGNOSIS — I2510 Atherosclerotic heart disease of native coronary artery without angina pectoris: Secondary | ICD-10-CM | POA: Diagnosis present

## 2024-04-24 DIAGNOSIS — D6832 Hemorrhagic disorder due to extrinsic circulating anticoagulants: Secondary | ICD-10-CM | POA: Diagnosis present

## 2024-04-24 DIAGNOSIS — E113493 Type 2 diabetes mellitus with severe nonproliferative diabetic retinopathy without macular edema, bilateral: Secondary | ICD-10-CM | POA: Diagnosis present

## 2024-04-24 DIAGNOSIS — E875 Hyperkalemia: Secondary | ICD-10-CM | POA: Diagnosis present

## 2024-04-24 DIAGNOSIS — C221 Intrahepatic bile duct carcinoma: Secondary | ICD-10-CM | POA: Diagnosis present

## 2024-04-24 DIAGNOSIS — Z9289 Personal history of other medical treatment: Secondary | ICD-10-CM | POA: Diagnosis not present

## 2024-04-24 DIAGNOSIS — D500 Iron deficiency anemia secondary to blood loss (chronic): Secondary | ICD-10-CM | POA: Diagnosis not present

## 2024-04-24 DIAGNOSIS — Z79899 Other long term (current) drug therapy: Secondary | ICD-10-CM | POA: Diagnosis not present

## 2024-04-24 DIAGNOSIS — L89156 Pressure-induced deep tissue damage of sacral region: Secondary | ICD-10-CM | POA: Diagnosis not present

## 2024-04-24 DIAGNOSIS — R2689 Other abnormalities of gait and mobility: Secondary | ICD-10-CM | POA: Diagnosis not present

## 2024-04-24 DIAGNOSIS — D8481 Immunodeficiency due to conditions classified elsewhere: Secondary | ICD-10-CM | POA: Diagnosis not present

## 2024-04-24 DIAGNOSIS — R29810 Facial weakness: Secondary | ICD-10-CM | POA: Diagnosis not present

## 2024-04-24 DIAGNOSIS — J189 Pneumonia, unspecified organism: Secondary | ICD-10-CM | POA: Diagnosis not present

## 2024-04-24 DIAGNOSIS — Z7401 Bed confinement status: Secondary | ICD-10-CM | POA: Diagnosis not present

## 2024-04-24 DIAGNOSIS — R7889 Finding of other specified substances, not normally found in blood: Secondary | ICD-10-CM | POA: Diagnosis not present

## 2024-04-24 DIAGNOSIS — C23 Malignant neoplasm of gallbladder: Secondary | ICD-10-CM | POA: Diagnosis present

## 2024-04-24 DIAGNOSIS — E1142 Type 2 diabetes mellitus with diabetic polyneuropathy: Secondary | ICD-10-CM | POA: Diagnosis present

## 2024-04-24 DIAGNOSIS — Z9861 Coronary angioplasty status: Secondary | ICD-10-CM | POA: Diagnosis not present

## 2024-04-24 DIAGNOSIS — Z743 Need for continuous supervision: Secondary | ICD-10-CM | POA: Diagnosis not present

## 2024-04-24 DIAGNOSIS — I252 Old myocardial infarction: Secondary | ICD-10-CM | POA: Diagnosis not present

## 2024-04-24 DIAGNOSIS — Z8509 Personal history of malignant neoplasm of other digestive organs: Secondary | ICD-10-CM | POA: Diagnosis not present

## 2024-04-24 DIAGNOSIS — E11649 Type 2 diabetes mellitus with hypoglycemia without coma: Secondary | ICD-10-CM | POA: Diagnosis present

## 2024-04-24 DIAGNOSIS — R1311 Dysphagia, oral phase: Secondary | ICD-10-CM | POA: Diagnosis not present

## 2024-04-24 DIAGNOSIS — K219 Gastro-esophageal reflux disease without esophagitis: Secondary | ICD-10-CM | POA: Diagnosis present

## 2024-04-24 DIAGNOSIS — L89154 Pressure ulcer of sacral region, stage 4: Secondary | ICD-10-CM | POA: Diagnosis present

## 2024-04-24 DIAGNOSIS — Z7984 Long term (current) use of oral hypoglycemic drugs: Secondary | ICD-10-CM | POA: Diagnosis not present

## 2024-04-24 DIAGNOSIS — R609 Edema, unspecified: Secondary | ICD-10-CM | POA: Diagnosis present

## 2024-04-24 DIAGNOSIS — C787 Secondary malignant neoplasm of liver and intrahepatic bile duct: Secondary | ICD-10-CM | POA: Diagnosis present

## 2024-04-24 DIAGNOSIS — I129 Hypertensive chronic kidney disease with stage 1 through stage 4 chronic kidney disease, or unspecified chronic kidney disease: Secondary | ICD-10-CM | POA: Diagnosis present

## 2024-04-24 DIAGNOSIS — N183 Chronic kidney disease, stage 3 unspecified: Secondary | ICD-10-CM | POA: Diagnosis not present

## 2024-04-24 DIAGNOSIS — E1151 Type 2 diabetes mellitus with diabetic peripheral angiopathy without gangrene: Secondary | ICD-10-CM | POA: Diagnosis present

## 2024-04-24 DIAGNOSIS — D62 Acute posthemorrhagic anemia: Secondary | ICD-10-CM | POA: Diagnosis present

## 2024-04-24 DIAGNOSIS — E1165 Type 2 diabetes mellitus with hyperglycemia: Secondary | ICD-10-CM | POA: Diagnosis not present

## 2024-04-24 DIAGNOSIS — I480 Paroxysmal atrial fibrillation: Secondary | ICD-10-CM | POA: Diagnosis present

## 2024-04-24 DIAGNOSIS — D630 Anemia in neoplastic disease: Secondary | ICD-10-CM | POA: Diagnosis present

## 2024-04-24 DIAGNOSIS — R531 Weakness: Secondary | ICD-10-CM | POA: Diagnosis not present

## 2024-04-24 DIAGNOSIS — E162 Hypoglycemia, unspecified: Secondary | ICD-10-CM | POA: Diagnosis present

## 2024-04-24 DIAGNOSIS — D631 Anemia in chronic kidney disease: Secondary | ICD-10-CM | POA: Diagnosis present

## 2024-04-24 DIAGNOSIS — Z7901 Long term (current) use of anticoagulants: Secondary | ICD-10-CM | POA: Diagnosis not present

## 2024-04-24 DIAGNOSIS — E1169 Type 2 diabetes mellitus with other specified complication: Secondary | ICD-10-CM | POA: Diagnosis present

## 2024-04-24 DIAGNOSIS — M6281 Muscle weakness (generalized): Secondary | ICD-10-CM | POA: Diagnosis not present

## 2024-04-24 DIAGNOSIS — Z8739 Personal history of other diseases of the musculoskeletal system and connective tissue: Secondary | ICD-10-CM | POA: Diagnosis not present

## 2024-04-24 DIAGNOSIS — Z951 Presence of aortocoronary bypass graft: Secondary | ICD-10-CM | POA: Diagnosis not present

## 2024-04-24 DIAGNOSIS — Z872 Personal history of diseases of the skin and subcutaneous tissue: Secondary | ICD-10-CM | POA: Diagnosis not present

## 2024-04-24 DIAGNOSIS — R000 Tachycardia, unspecified: Secondary | ICD-10-CM | POA: Diagnosis not present

## 2024-04-24 DIAGNOSIS — E785 Hyperlipidemia, unspecified: Secondary | ICD-10-CM | POA: Diagnosis present

## 2024-04-24 DIAGNOSIS — D649 Anemia, unspecified: Secondary | ICD-10-CM | POA: Diagnosis present

## 2024-04-24 DIAGNOSIS — L89159 Pressure ulcer of sacral region, unspecified stage: Secondary | ICD-10-CM | POA: Diagnosis not present

## 2024-04-24 DIAGNOSIS — K644 Residual hemorrhoidal skin tags: Secondary | ICD-10-CM | POA: Diagnosis not present

## 2024-04-24 DIAGNOSIS — Z794 Long term (current) use of insulin: Secondary | ICD-10-CM | POA: Diagnosis not present

## 2024-04-24 DIAGNOSIS — Z681 Body mass index (BMI) 19 or less, adult: Secondary | ICD-10-CM | POA: Diagnosis not present

## 2024-04-24 DIAGNOSIS — E113393 Type 2 diabetes mellitus with moderate nonproliferative diabetic retinopathy without macular edema, bilateral: Secondary | ICD-10-CM | POA: Diagnosis not present

## 2024-04-24 DIAGNOSIS — N184 Chronic kidney disease, stage 4 (severe): Secondary | ICD-10-CM | POA: Diagnosis not present

## 2024-04-24 DIAGNOSIS — N179 Acute kidney failure, unspecified: Secondary | ICD-10-CM | POA: Diagnosis not present

## 2024-04-24 DIAGNOSIS — R41841 Cognitive communication deficit: Secondary | ICD-10-CM | POA: Diagnosis not present

## 2024-04-24 DIAGNOSIS — Z452 Encounter for adjustment and management of vascular access device: Secondary | ICD-10-CM | POA: Diagnosis not present

## 2024-04-24 DIAGNOSIS — R4182 Altered mental status, unspecified: Secondary | ICD-10-CM | POA: Diagnosis not present

## 2024-04-24 DIAGNOSIS — Z95828 Presence of other vascular implants and grafts: Secondary | ICD-10-CM | POA: Diagnosis not present

## 2024-04-24 DIAGNOSIS — I10 Essential (primary) hypertension: Secondary | ICD-10-CM | POA: Diagnosis not present

## 2024-04-24 DIAGNOSIS — N1831 Chronic kidney disease, stage 3a: Secondary | ICD-10-CM | POA: Diagnosis present

## 2024-04-24 DIAGNOSIS — E1122 Type 2 diabetes mellitus with diabetic chronic kidney disease: Secondary | ICD-10-CM | POA: Diagnosis present

## 2024-04-24 DIAGNOSIS — M4628 Osteomyelitis of vertebra, sacral and sacrococcygeal region: Secondary | ICD-10-CM | POA: Diagnosis present

## 2024-04-24 DIAGNOSIS — I959 Hypotension, unspecified: Secondary | ICD-10-CM | POA: Diagnosis not present

## 2024-04-24 DIAGNOSIS — Z8522 Personal history of malignant neoplasm of nasal cavities, middle ear, and accessory sinuses: Secondary | ICD-10-CM | POA: Diagnosis not present

## 2024-04-24 DIAGNOSIS — Z8719 Personal history of other diseases of the digestive system: Secondary | ICD-10-CM | POA: Diagnosis not present

## 2024-04-24 DIAGNOSIS — E161 Other hypoglycemia: Secondary | ICD-10-CM | POA: Diagnosis not present

## 2024-04-24 DIAGNOSIS — M6259 Muscle wasting and atrophy, not elsewhere classified, multiple sites: Secondary | ICD-10-CM | POA: Diagnosis not present

## 2024-04-24 DIAGNOSIS — D696 Thrombocytopenia, unspecified: Secondary | ICD-10-CM | POA: Diagnosis present

## 2024-04-28 ENCOUNTER — Ambulatory Visit (INDEPENDENT_AMBULATORY_CARE_PROVIDER_SITE_OTHER): Admitting: Infectious Diseases

## 2024-04-28 ENCOUNTER — Telehealth: Payer: Self-pay

## 2024-04-28 ENCOUNTER — Ambulatory Visit: Admitting: Infectious Diseases

## 2024-04-28 ENCOUNTER — Other Ambulatory Visit: Payer: Self-pay

## 2024-04-28 ENCOUNTER — Encounter: Payer: Self-pay | Admitting: Infectious Diseases

## 2024-04-28 VITALS — BP 116/67 | HR 77 | Temp 97.5°F

## 2024-04-28 DIAGNOSIS — C221 Intrahepatic bile duct carcinoma: Secondary | ICD-10-CM

## 2024-04-28 DIAGNOSIS — M4628 Osteomyelitis of vertebra, sacral and sacrococcygeal region: Secondary | ICD-10-CM | POA: Insufficient documentation

## 2024-04-28 DIAGNOSIS — Z79899 Other long term (current) drug therapy: Secondary | ICD-10-CM | POA: Insufficient documentation

## 2024-04-28 DIAGNOSIS — Z95828 Presence of other vascular implants and grafts: Secondary | ICD-10-CM | POA: Diagnosis not present

## 2024-04-28 DIAGNOSIS — L89154 Pressure ulcer of sacral region, stage 4: Secondary | ICD-10-CM

## 2024-04-28 NOTE — Progress Notes (Unsigned)
 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   Pressure injury of sacral region, stage 4 (HCC) 04/15/2024   Osteomyelitis of sacrum (HCC) 04/15/2024   Acute diarrhea 04/15/2024   Acute on chronic anemia 04/14/2024   Paroxysmal atrial fibrillation (HCC) 04/14/2024   Chronic kidney disease, stage 3a (HCC) 04/14/2024   Hyperlipidemia associated with type 2 diabetes mellitus (HCC) 04/14/2024   Severe sepsis (HCC) 02/12/2024   Pneumonia 02/12/2024   Transaminitis 02/12/2024   Atrial fibrillation with RVR (HCC) 02/12/2024   Metabolic acidosis 02/12/2024   Port-A-Cath in place 05/02/2023   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   Coronary artery disease involving native heart without angina pectoris 04/05/2023   Jaundice 04/04/2023   Cholelithiasis 10/12/2022   Symptomatic cholelithiasis 10/12/2022   Family history of malignant neoplasm of digestive organ 02/17/2022   Gangrenous disorder (HCC) 02/17/2022   Gastro-esophageal reflux disease without esophagitis 02/17/2022   Hyperglycemia due to type 2 diabetes mellitus (HCC) 02/17/2022   Iron  deficiency anemia 02/17/2022   Leukocytosis 02/17/2022   Moderate nonproliferative diabetic retinopathy of both eyes without macular edema associated with type 2 diabetes mellitus (HCC) 02/17/2022   Other acute osteomyelitis, left ankle and foot (HCC) 02/17/2022   Pure hypercholesterolemia 02/17/2022   Skin ulcer of sacrum, limited to breakdown of skin (HCC) 02/17/2022   Thrombocytopenic disorder (HCC) 02/17/2022   Tinnitus 02/17/2022   S/P angiogram of extremity 10/13/2021   Osteomyelitis of foot, left, acute (HCC) 04/27/2021   Cellulitis in diabetic foot (HCC) 04/27/2021   AKI (acute kidney injury) (HCC) 04/27/2021   Hyponatremia 04/27/2021   Symptomatic anemia 04/27/2021   Cellulitis of left lower extremity    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   Critical lower limb ischemia (HCC) 08/03/2019   Encounter for immunization 07/09/2016   Essential hypertension 03/31/2013   Dyslipidemia 03/31/2013   Type 2 diabetes mellitus with stage 3a chronic kidney disease, without long-term current use of insulin  (HCC) 03/31/2013   Overweight (BMI 25.0-29.9) 03/31/2013   Carotid artery disease (HCC) 03/31/2013   PAD (peripheral artery disease) (HCC) 03/31/2013    Patient's Medications  New Prescriptions   No medications on file  Previous Medications   ACETAMINOPHEN  (TYLENOL ) 500 MG TABLET    Take 1,000 mg by mouth every 6 (six) hours as needed for moderate pain (pain score 4-6), fever or mild pain (pain score 1-3).   ACIDOPHILUS (RISAQUAD) CAPS CAPSULE    Take 1 capsule by mouth daily.   BRIMONIDINE  (ALPHAGAN ) 0.2 % OPHTHALMIC SOLUTION    Place 1 drop into the right eye 2 (two) times daily.   DOXYCYCLINE  (VIBRA -TABS) 100 MG TABLET    Take 1 tablet (100 mg total) by mouth every 12 (twelve) hours.   FEEDING SUPPLEMENT (ENSURE ENLIVE / ENSURE PLUS) LIQD    Take 237 mLs by mouth 2 (two) times daily between meals.   FERROUS SULFATE  325 (65  FE) MG EC TABLET    Take 1 tablet (325 mg total) by mouth daily before breakfast. To be taken with a cup of orange juice on an otherwise empty stomach if possible   MEROPENEM  1 G IN SODIUM CHLORIDE  0.9 % 100 ML    Inject 1 g into the vein every 12 (twelve) hours.   METOPROLOL  TARTRATE  (LOPRESSOR ) 25 MG TABLET    Take 1 tablet (25 mg total) by mouth 2 (two) times daily.   MIRTAZAPINE  (REMERON ) 15 MG TABLET    Take 1 tablet (15 mg total) by mouth at bedtime.   PANTOPRAZOLE  (PROTONIX ) 20 MG TABLET    Take 20 mg by mouth daily.   POLYETHYLENE GLYCOL (MIRALAX  / GLYCOLAX ) 17 G PACKET    Take 17 g by mouth daily.   PROPYLENE GLYCOL (SYSTANE BALANCE) 0.6 % SOLN    Place 1 drop into the left eye 3 (three) times daily as needed (dry/irritated eyes).   RIVAROXABAN  (XARELTO ) 10 MG TABS TABLET    Take 1 tablet (10 mg total) by mouth daily.   ROSUVASTATIN  (CRESTOR ) 40 MG TABLET    Take 1 tablet (40 mg total) by mouth daily.   TELMISARTAN  (MICARDIS ) 40 MG TABLET    Take 40 mg by mouth daily.   TRAZODONE  (DESYREL ) 100 MG TABLET    Take 100 mg by mouth at bedtime.  Modified Medications   No medications on file  Discontinued Medications   No medications on file    Subjective: Discussed the use of AI scribe software for clinical note transcription with the patient, who gave verbal consent to proceed.   83 Y O female with prior h/o CAD s/p CABG, PAF on AC, PVD s/p multiple vascular interventions and b/l TMAs, HTN, HLD, GERD, CKD cholangiocarcinoma, follows Dr Lanny and protacath+, bilateral TMAs who is here for follow up for infected sacral ulcer/osteomyelitis.   Last seen by Dr Overton on 7/15, started on Vancomycin  and meropenem  from 7/7 with EOT 8/15. Vancomycin  changed to PO doxycycline  on 7/15. 6/19 wound PCR noted( media).  Recently admitted 7/22-7/24 for acute anemia. Work up + for hemoccult, was transfused 2u PRBCs. GI recommended conservative management with holding AC. Etiology of anemia was thought to be multifactorial in the setting of poor oral intake, iron  deficiency and intermittent bleeding from sacral ulcer.   8/5 She is by herself. No MAR from facility. Patient says she is getting doxycycline  and meropenem  though. Denies fevers, chills, nausea, vomiting and diarrhea. Denies  concerns with PICC or portacath. She follows Dr Lanny for her cancer and reports currently not on any tx and plan to start immunotherapy in near future. She showed me pictures of sacral wound from today in her phone. She says wound vac is taken off for clinic visit and will be placed back when she returns to SNF. She reports being ambulatory. Denies other complaints.   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 There is a small calcified mass in the L:A near the LA appendage. No valvular masses seen with associated mitral annular calcification. LA Volume/ BSA27.4 ml/m2 No AS. Right ventricular systolic pressure is elevated at .   Jaundice    Myocardial infarction [facility])    June 2021   Neuromuscular disorder (HCC)    neuropathy in bilateral feet   Peripheral vascular disease (HCC)    Pneumonia    not hosp.    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  PTCAs with drug coated balloon Lt ant tibial artery and Lt popliteal artery  11/26/2019   Past Surgical History:  Procedure Laterality Date   ABDOMINAL AORTAGRAM  11/25/2019   ABDOMINAL AORTOGRAM W/LOWER EXTREMITY    ABDOMINAL AORTOGRAM N/A 06/15/2019   Procedure: ABDOMINAL AORTOGRAM;  Surgeon: Court Dorn PARAS, MD;  [facility];  Service:  Cardiovascular;  Laterality: N/A;   ABDOMINAL AORTOGRAM W/LOWER EXTREMITY N/A 11/25/2019   Procedure: ABDOMINAL AORTOGRAM W/LOWER EXTREMITY;  Surgeon: Darron Deatrice LABOR, MD;  [facility];  Service: Cardiovascular;  Laterality: N/A;  Lt leg   ABDOMINAL AORTOGRAM W/LOWER EXTREMITY N/A 08/01/2020   Procedure: ABDOMINAL AORTOGRAM W/LOWER EXTREMITY;  Surgeon: Court Dorn PARAS, MD;  [facility];  Service: Cardiovascular;  Laterality: N/A;   ABDOMINAL AORTOGRAM W/LOWER EXTREMITY N/A 04/28/2021   Procedure: ABDOMINAL AORTOGRAM W/LOWER EXTREMITY;  Surgeon: Harvey Carlin BRAVO, MD;  [facility];  Service: Cardiovascular;  Laterality: N/A;   ABDOMINAL AORTOGRAM W/LOWER EXTREMITY N/A 10/13/2021   Procedure: ABDOMINAL AORTOGRAM W/LOWER EXTREMITY;  Surgeon: Magda Debby SAILOR, MD;  [facility];  Service: Cardiovascular;  Laterality: N/A;   ABDOMINAL AORTOGRAM W/LOWER EXTREMITY N/A 11/09/2022   Procedure: ABDOMINAL AORTOGRAM W/LOWER EXTREMITY;  Surgeon: Magda Debby SAILOR, MD;  [facility];  Service: Cardiovascular;  Laterality: N/A;   ABDOMINAL AORTOGRAM W/LOWER EXTREMITY N/A 11/16/2022   Procedure: ABDOMINAL AORTOGRAM W/LOWER EXTREMITY;  Surgeon: Magda Debby SAILOR, MD;  [facility];  Service: Cardiovascular;  Laterality: N/A;   AMPUTATION TOE Left 05/03/2021   Procedure: AMPUTATION OF THIRD LEFT  TOE;  Surgeon: Magda Debby SAILOR, MD;  Location: [facility] OR;  Service: Vascular;  Laterality: Left;   APPENDECTOMY     APPLICATION OF WOUND VAC Right 07/21/2019   Procedure: Application Of Prevena Wound Vac Right Groin;  Surgeon: Serene Gaile ORN, MD;  Location: [facility] OR;  Service: Vascular;  Laterality: Right;   BILIARY BRUSHING  04/07/2023   Procedure: BILIARY BRUSHING;  Surgeon: Rosalie Kitchens, MD;  Location: [facility] ENDOSCOPY;  Service: Gastroenterology;;   BILIARY STENT PLACEMENT  04/07/2023   Procedure: BILIARY STENT PLACEMENT;   Surgeon: Rosalie Kitchens, MD;  Location: [facility] ENDOSCOPY;  Service: Gastroenterology;;   CARPAL TUNNEL RELEASE Left    CARPAL TUNNEL RELEASE Right    CESAREAN SECTION     x 2   CHOLECYSTECTOMY N/A 10/12/2022   Procedure: LAPAROSCOPIC PARTIAL FENESTRATING CHOLECYSTECTOMY;  Surgeon: Dasie Leonor CROME, MD;  [facility];  Service: General;  Laterality: N/A;   COLONOSCOPY     CORONARY ARTERY BYPASS GRAFT N/A 03/24/2020   Procedure: CORONARY ARTERY BYPASS GRAFTING (CABG) using LIMA to LAD (m); RIMA to RAMUS; Endoscopic Right Greater Saphenous Vein:  SVG to Diag1; SVG to PLB (right); and SVG to PL (left).;  Surgeon: German Bartlett PEDLAR, MD;  Location: [facility] OR;  Service: Open Heart Surgery;  Laterality: N/A;  BILATERAL IMA   ENDARTERECTOMY FEMORAL Right 07/21/2019   Procedure: RIGHT ENDARTERECTOMY FEMORAL WITH PATCH ANGIOPLASTY;  Surgeon: Serene Gaile ORN, MD;  [facility];  Service: Vascular;  Laterality: Right;   ENDARTERECTOMY FEMORAL Right 08/16/2020   Procedure: RIGHT FEMORAL ENDARTERECTOMY;  Surgeon: Sheree Penne Bruckner, MD;  Location: [facility] OR;  Service: Vascular;  Laterality: Right;   ENDOVEIN HARVEST OF GREATER SAPHENOUS VEIN Right 03/24/2020   Procedure: ENDOVEIN HARVEST OF GREATER SAPHENOUS VEIN;  Surgeon: German Bartlett PEDLAR, MD;  [facility];  Service: Open Heart Surgery;  Laterality: Right;   ERCP N/A 04/07/2023   Procedure: ENDOSCOPIC RETROGRADE CHOLANGIOPANCREATOGRAPHY (ERCP);  Surgeon: Rosalie Kitchens, MD;  Location: [facility] ENDOSCOPY;  Service: Gastroenterology;  Laterality: N/A;   EYE SURGERY     cataract removal bilaterally   FEMORAL-POPLITEAL BYPASS GRAFT Right 08/16/2020   Procedure: BYPASS GRAFT FEMORAL-POPLITEAL ARTERY;  Surgeon: Sheree Penne Bruckner, MD;  Location: [facility] OR;  Service: Vascular;  Laterality: Right;   FEMORAL-POPLITEAL BYPASS GRAFT Left 05/03/2021   Procedure: LEFT FEMORAL TO BELOW KNEE POPLITEAL ARTERY BYPASS GRAFTING WITH 6MMX80 PTFE GRAFT;  Surgeon: Magda Debby SAILOR,  MD;  [facility];  Service: Vascular;  Laterality: Left;   LEFT HEART CATH AND CORONARY ANGIOGRAPHY N/A 03/22/2020   Procedure: LEFT HEART CATH AND CORONARY ANGIOGRAPHY;  Surgeon: Swazil[place], Peter M, MD;  Location: [facility] INVASIVE CV LAB;  Service: Cardiovascular;  Laterality: N/A;   LOWER EXTREMITY ANGIOGRAPHY Bilateral 06/15/2019   Procedure: Lower Extremity Angiography;  Surgeon: Court Dorn PARAS, MD;  Location: [facility] INVASIVE CV LAB;  Service: Cardiovascular;  Laterality: Bilateral;   LOWER EXTREMITY ANGIOGRAPHY Right 08/03/2019   Procedure: LOWER EXTREMITY ANGIOGRAPHY;  Surgeon: Court Dorn PARAS, MD;  [facility];  Service: Cardiovascular;  Laterality: Right;   LOWER EXTREMITY ANGIOGRAPHY N/A 09/07/2021   Procedure: LOWER EXTREMITY ANGIOGRAPHY;  Surgeon: Gretta Bruckner PARAS, MD;  [facility];  Service: Cardiovascular;  Laterality: N/A;   PATCH ANGIOPLASTY Right 07/21/2019   Procedure: Patch Angioplasty Right Femoral Artery;  Surgeon: Serene Gaile ORN, MD;  Location: [facility] OR;  Service: Vascular;  Laterality: Right;   PERIPHERAL INTRAVASCULAR LITHOTRIPSY  11/25/2019   Procedure: INTRAVASCULAR LITHOTRIPSY;  Surgeon: Darron Deatrice LABOR, MD;  [facility];  Service: Cardiovascular;;  LT. SFA   PERIPHERAL VASCULAR ATHERECTOMY  08/03/2019   Procedure: PERIPHERAL VASCULAR ATHERECTOMY;  Surgeon: Court Dorn PARAS, MD;  Location: [facility] INVASIVE CV LAB;  Service: Cardiovascular;;  right SFA, right TP trunk   PERIPHERAL VASCULAR ATHERECTOMY  11/25/2019   Procedure: PERIPHERAL VASCULAR ATHERECTOMY;  Surgeon: Darron Deatrice LABOR, MD;  [facility];  Service: Cardiovascular;;  Lt.  POPLITEAL and AT   PERIPHERAL VASCULAR BALLOON ANGIOPLASTY Left 06/15/2019   Procedure: PERIPHERAL VASCULAR BALLOON ANGIOPLASTY;  Surgeon: Court Dorn PARAS, MD;  [facility];  Service: Cardiovascular;  Laterality: Left;  SFA UNSUCCESSFUL UNABLE TO CROSS  LESION   PERIPHERAL VASCULAR BALLOON ANGIOPLASTY  08/03/2019   Procedure: PERIPHERAL VASCULAR BALLOON ANGIOPLASTY;  Surgeon: Court Dorn PARAS, MD;  [facility];  Service: Cardiovascular;;  right SFA, Right TP trunk   PERIPHERAL VASCULAR BALLOON ANGIOPLASTY  09/07/2021   Procedure: PERIPHERAL VASCULAR BALLOON ANGIOPLASTY;  Surgeon: Gretta Bruckner PARAS, MD;  [facility];  Service: Cardiovascular;;   PERIPHERAL VASCULAR BALLOON ANGIOPLASTY Right 10/13/2021   Procedure: PERIPHERAL VASCULAR BALLOON ANGIOPLASTY;  Surgeon: Magda Debby SAILOR, MD;  [facility];  Service: Cardiovascular;  Laterality: Right;   PERIPHERAL VASCULAR BALLOON ANGIOPLASTY  11/09/2022   Procedure: PERIPHERAL VASCULAR BALLOON ANGIOPLASTY;  Surgeon: Magda Debby SAILOR, MD;  [facility];  Service: Cardiovascular;;  fem-pop bypass and AT   PORTACATH PLACEMENT Left 04/29/2023   Procedure: INSERTION PORT-A-CATH;  Surgeon: Dasie Leonor CROME, MD;  Location: [facility] ORS;  Service: General;  Laterality: Left;   PORTACATH PLACEMENT N/A 07/12/2023   Procedure: REVISION PORT-A-CATH;  Surgeon: Dasie Leonor CROME, MD;  Location: [facility] OR;  Service: General;  Laterality: N/A;   REMOVAL OF STONES  04/07/2023   Procedure: REMOVAL OF STONES;  Surgeon: Rosalie Kitchens, MD;  Location: [facility] ENDOSCOPY;  Service: Gastroenterology;;   ANNETT  04/07/2023   Procedure: ANNETT;  Surgeon: Rosalie Kitchens, MD;  Location: [facility] ENDOSCOPY;  Service: Gastroenterology;;   TEE WITHOUT CARDIOVERSION N/A 03/24/2020   Procedure: TRANSESOPHAGEAL ECHOCARDIOGRAM (TEE);  Surgeon: German Bartlett PEDLAR, MD;  Location: [facility] OR;  Service: Open Heart Surgery;  Laterality: N/A;   TONSILLECTOMY     and adenoidectomy   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/2019   Procedure: TRANSMETATARSAL AMPUTATION;  Surgeon: Gershon Donnice SAUNDERS, DPM;  [facility];  Service: Podiatry;  Laterality: Right;   TRANSMETATARSAL AMPUTATION Left  05/11/2021   Procedure: LEFT TRANSMETATARSAL AMPUTATION;  Surgeon: Sheree Penne Bruckner, MD;  Location: [facility] OR;  Service: Vascular;  Laterality: Left;   TUBAL LIGATION      Social History   Tobacco Use   Smoking status: Former    Current packs/day: 0.00    Types: Cigarettes    Quit date: 03/31/1972    Years since quitting: 52.1   Smokeless tobacco: Never  Vaping Use   Vaping status: Never Used  Substance Use Topics   Alcohol  use: No   Drug use: No    Family History  Problem Relation Age of Onset   Cancer - Prostate Father    Cancer - Colon Father    Stroke Mother    Hypertension Mother    Hyperlipidemia Mother    Melanoma Brother     Allergies  Allergen Reactions   Versed  [Midazolam ] Anxiety    Frantic, out of my mind, agitated    Bactrim  [Sulfamethoxazole -Trimethoprim ] Other (See Comments)    ^ K+( elevated)    Augmentin [Amoxicillin-Pot Clavulanate] Other (See Comments)    Elevates potassium level   Demerol [Meperidine]     Delusional    Meperidine Hcl     Other reaction(s): delusional   Scopolamine     Delusional   Patch *   Tramadol  Other (See Comments)    Keeps awake    Health Maintenance  Topic Date Due   DTaP/Tdap/Td (3 - Td or Tdap) 01/21/2021   OPHTHALMOLOGY EXAM  03/08/2022   FOOT EXAM  05/16/2023   COVID-19 Vaccine (5 - 2024-25 season) 05/26/2023   INFLUENZA VACCINE  04/24/2024   HEMOGLOBIN A1C  08/15/2024   Medicare Annual Wellness (AWV)  09/03/2024   Diabetic kidney evaluation - Urine ACR  09/22/2024   Diabetic kidney evaluation - eGFR measurement  04/16/2025   Pneumococcal Vaccine: 50+ Years  Completed   DEXA SCAN  Completed   Zoster Vaccines- Shingrix  Completed   Hepatitis B Vaccines  Aged Out   HPV VACCINES  Aged Out   Meningococcal B Vaccine  Aged Out  Hepatitis C Screening  Discontinued    Objective: BP 116/67   Pulse 77   Temp (!) 97.5 F (36.4 C) (Temporal)   SpO2 95%    Physical Exam Constitutional:       Appearance: Normal appearance.  HENT:     Head: Normocephalic and atraumatic.      Mouth: Mucous membranes are moist.  Eyes:    Conjunctiva/sclera: Conjunctivae normal.     Pupils: Pupils are equal, round, and b/l symmetrical    Cardiovascular:     Rate and Rhythm: Normal rate and Irregular rhythm.     Heart sounds: s1s2  Pulmonary:     Effort: Pulmonary effort is normal.     Breath sounds: Normal breath sounds.   Abdominal:     General: Non distended     Palpations: soft.   Musculoskeletal:        General: sitting in the wheelchair  Skin:    General: Skin is warm and dry.     Comments: Pictures of sacral wound taken from today from patient's phone          Picture of sacral wound taken from patient's phone in early June     Neurological:     General: grossly non focal     Mental Status: awake, alert and oriented to person, place, and time.   Psychiatric:        Mood and Affect: Mood normal.   Lab Results Lab Results  Component Value Date   WBC 8.7 04/16/2024   HGB 9.2 (L) 04/16/2024   HCT 28.9 (L) 04/16/2024   MCV 88.1 04/16/2024   PLT 160 04/16/2024    Lab Results  Component Value Date   CREATININE 1.03 (H) 04/16/2024   BUN 18 04/16/2024   NA 134 (L) 04/16/2024   K 4.2 04/16/2024   CL 105 04/16/2024   CO2 23 04/16/2024    Lab Results  Component Value Date   ALT 28 04/16/2024   AST 51 (H) 04/16/2024   ALKPHOS 150 (H) 04/16/2024   BILITOT 0.7 04/16/2024    Lab Results  Component Value Date   CHOL 117 09/23/2023   HDL 42 (L) 09/23/2023   LDLCALC 53 09/23/2023   TRIG 140 09/23/2023   CHOLHDL 2.8 09/23/2023   No results found for: LABRPR, RPRTITER No results found for: HIV1RNAQUANT, HIV1RNAVL, CD4TABS   Microbiology   Imaging   Assessment/Plan # Sacral Ulcer/osteomyelitis   Plan -Complete course of po doxycycline  and meropenem  through 8/15 -Continue wound care, optimize nutrition and offloading  - CBC and CMP as no  labs sent from facility  -fu as needed   # Cholangiocarcinoma  - s/p completion of radiation on April 2025, durvalumab  on hold with plan to start immunotherapy July 10th ( not started yet ?). CT CAP completed, results pending  - fu with Oncology Dr Lanny  I spent 37 minutes involved in face-to-face and non-face-to-face activities for this patient on the day of the visit. Professional time spent includes the following activities: Preparing to see the patient (review of tests), Obtaining and reviewing separately obtained history (discharge record 7/24, Last notes from Dr Overton and Dr Lanny), Performing a medically appropriate examination and evaluation , Ordering medications/labs, communicating with other health care professionals including SNF, Documenting clinical information in the EMR, Independently interpreting results (not separately reported), Communicating results to the patient, Counseling and educating the patient and Care coordination (not separately reported).   Of note, portions of this note may have  been created with voice recognition software. While this note has been edited for accuracy, occasional wrong-word or 'sound-a-like' substitutions may have occurred due to the inherent limitations of voice recognition software.   Annalee Joseph, MD [facility] 04/28/2024, 12:42 PM

## 2024-04-28 NOTE — Telephone Encounter (Addendum)
 Called [facility] to have them fax over patient medication list and labs. Spoke with Marjorie who stated that she will fax it over.   Per Dr. Dea end date to stop IV abx and pull picc after last dose is on 8/15. Facility did not send any paperwork, orders were written on our order form and sent back to facility with patient.

## 2024-04-28 NOTE — Progress Notes (Unsigned)
 Labs drawn via PICC line per Dr. Dea. Line flushed with 10 mL normal saline and clamped. Patient tolerated procedure well.   No Biopatch present over PICC insertion site. Orders sent back with patient to her facility to add Biopatch to PICC dressing.   Javell Blackburn, BSN, RN

## 2024-04-29 ENCOUNTER — Telehealth: Payer: Self-pay | Admitting: Infectious Disease

## 2024-04-29 ENCOUNTER — Other Ambulatory Visit: Payer: Self-pay

## 2024-04-29 ENCOUNTER — Ambulatory Visit: Payer: Self-pay | Admitting: Infectious Diseases

## 2024-04-29 ENCOUNTER — Inpatient Hospital Stay (HOSPITAL_COMMUNITY)
Admission: EM | Admit: 2024-04-29 | Discharge: 2024-05-01 | DRG: 592 | Disposition: A | Discharge status: Skilled Nursing Facility | Source: Skilled Nursing Facility | Attending: Internal Medicine | Admitting: Internal Medicine

## 2024-04-29 ENCOUNTER — Encounter: Payer: Self-pay | Admitting: Hematology

## 2024-04-29 ENCOUNTER — Observation Stay (HOSPITAL_COMMUNITY)

## 2024-04-29 ENCOUNTER — Encounter (HOSPITAL_COMMUNITY): Payer: Self-pay | Admitting: Emergency Medicine

## 2024-04-29 DIAGNOSIS — R000 Tachycardia, unspecified: Secondary | ICD-10-CM | POA: Diagnosis not present

## 2024-04-29 DIAGNOSIS — Z79899 Other long term (current) drug therapy: Secondary | ICD-10-CM

## 2024-04-29 DIAGNOSIS — E113493 Type 2 diabetes mellitus with severe nonproliferative diabetic retinopathy without macular edema, bilateral: Secondary | ICD-10-CM | POA: Diagnosis present

## 2024-04-29 DIAGNOSIS — K219 Gastro-esophageal reflux disease without esophagitis: Secondary | ICD-10-CM | POA: Diagnosis present

## 2024-04-29 DIAGNOSIS — R7401 Elevation of levels of liver transaminase levels: Secondary | ICD-10-CM | POA: Diagnosis present

## 2024-04-29 DIAGNOSIS — E1122 Type 2 diabetes mellitus with diabetic chronic kidney disease: Secondary | ICD-10-CM | POA: Diagnosis present

## 2024-04-29 DIAGNOSIS — I252 Old myocardial infarction: Secondary | ICD-10-CM | POA: Diagnosis not present

## 2024-04-29 DIAGNOSIS — I480 Paroxysmal atrial fibrillation: Secondary | ICD-10-CM | POA: Diagnosis present

## 2024-04-29 DIAGNOSIS — D696 Thrombocytopenia, unspecified: Secondary | ICD-10-CM | POA: Diagnosis present

## 2024-04-29 DIAGNOSIS — Z681 Body mass index (BMI) 19 or less, adult: Secondary | ICD-10-CM | POA: Diagnosis not present

## 2024-04-29 DIAGNOSIS — D630 Anemia in neoplastic disease: Secondary | ICD-10-CM | POA: Diagnosis present

## 2024-04-29 DIAGNOSIS — E1142 Type 2 diabetes mellitus with diabetic polyneuropathy: Secondary | ICD-10-CM | POA: Diagnosis present

## 2024-04-29 DIAGNOSIS — Z89421 Acquired absence of other right toe(s): Secondary | ICD-10-CM

## 2024-04-29 DIAGNOSIS — E785 Hyperlipidemia, unspecified: Secondary | ICD-10-CM | POA: Diagnosis present

## 2024-04-29 DIAGNOSIS — E11649 Type 2 diabetes mellitus with hypoglycemia without coma: Secondary | ICD-10-CM | POA: Diagnosis present

## 2024-04-29 DIAGNOSIS — D649 Anemia, unspecified: Principal | ICD-10-CM | POA: Diagnosis present

## 2024-04-29 DIAGNOSIS — Z7984 Long term (current) use of oral hypoglycemic drugs: Secondary | ICD-10-CM

## 2024-04-29 DIAGNOSIS — D631 Anemia in chronic kidney disease: Secondary | ICD-10-CM | POA: Diagnosis present

## 2024-04-29 DIAGNOSIS — D6832 Hemorrhagic disorder due to extrinsic circulating anticoagulants: Secondary | ICD-10-CM | POA: Diagnosis present

## 2024-04-29 DIAGNOSIS — E43 Unspecified severe protein-calorie malnutrition: Secondary | ICD-10-CM | POA: Diagnosis present

## 2024-04-29 DIAGNOSIS — Z452 Encounter for adjustment and management of vascular access device: Secondary | ICD-10-CM | POA: Diagnosis not present

## 2024-04-29 DIAGNOSIS — I2510 Atherosclerotic heart disease of native coronary artery without angina pectoris: Secondary | ICD-10-CM | POA: Diagnosis present

## 2024-04-29 DIAGNOSIS — C221 Intrahepatic bile duct carcinoma: Secondary | ICD-10-CM | POA: Diagnosis present

## 2024-04-29 DIAGNOSIS — Z882 Allergy status to sulfonamides status: Secondary | ICD-10-CM

## 2024-04-29 DIAGNOSIS — D62 Acute posthemorrhagic anemia: Secondary | ICD-10-CM | POA: Diagnosis present

## 2024-04-29 DIAGNOSIS — E1169 Type 2 diabetes mellitus with other specified complication: Secondary | ICD-10-CM | POA: Diagnosis present

## 2024-04-29 DIAGNOSIS — L89154 Pressure ulcer of sacral region, stage 4: Principal | ICD-10-CM | POA: Diagnosis present

## 2024-04-29 DIAGNOSIS — Z8249 Family history of ischemic heart disease and other diseases of the circulatory system: Secondary | ICD-10-CM

## 2024-04-29 DIAGNOSIS — Z89422 Acquired absence of other left toe(s): Secondary | ICD-10-CM

## 2024-04-29 DIAGNOSIS — N1831 Chronic kidney disease, stage 3a: Secondary | ICD-10-CM | POA: Diagnosis present

## 2024-04-29 DIAGNOSIS — Z885 Allergy status to narcotic agent status: Secondary | ICD-10-CM

## 2024-04-29 DIAGNOSIS — I129 Hypertensive chronic kidney disease with stage 1 through stage 4 chronic kidney disease, or unspecified chronic kidney disease: Secondary | ICD-10-CM | POA: Diagnosis present

## 2024-04-29 DIAGNOSIS — Z87891 Personal history of nicotine dependence: Secondary | ICD-10-CM

## 2024-04-29 DIAGNOSIS — I10 Essential (primary) hypertension: Secondary | ICD-10-CM | POA: Diagnosis present

## 2024-04-29 DIAGNOSIS — E875 Hyperkalemia: Secondary | ICD-10-CM | POA: Diagnosis present

## 2024-04-29 DIAGNOSIS — T45515A Adverse effect of anticoagulants, initial encounter: Secondary | ICD-10-CM | POA: Diagnosis present

## 2024-04-29 DIAGNOSIS — S61412A Laceration without foreign body of left hand, initial encounter: Secondary | ICD-10-CM | POA: Diagnosis present

## 2024-04-29 DIAGNOSIS — Z951 Presence of aortocoronary bypass graft: Secondary | ICD-10-CM | POA: Diagnosis not present

## 2024-04-29 DIAGNOSIS — M4628 Osteomyelitis of vertebra, sacral and sacrococcygeal region: Secondary | ICD-10-CM | POA: Diagnosis present

## 2024-04-29 DIAGNOSIS — I779 Disorder of arteries and arterioles, unspecified: Secondary | ICD-10-CM | POA: Diagnosis present

## 2024-04-29 DIAGNOSIS — E1151 Type 2 diabetes mellitus with diabetic peripheral angiopathy without gangrene: Secondary | ICD-10-CM | POA: Diagnosis present

## 2024-04-29 DIAGNOSIS — L8990 Pressure ulcer of unspecified site, unspecified stage: Secondary | ICD-10-CM | POA: Diagnosis present

## 2024-04-29 DIAGNOSIS — L8994 Pressure ulcer of unspecified site, stage 4: Secondary | ICD-10-CM | POA: Diagnosis present

## 2024-04-29 DIAGNOSIS — Z7901 Long term (current) use of anticoagulants: Secondary | ICD-10-CM

## 2024-04-29 DIAGNOSIS — Z7401 Bed confinement status: Secondary | ICD-10-CM | POA: Diagnosis not present

## 2024-04-29 DIAGNOSIS — R531 Weakness: Secondary | ICD-10-CM | POA: Diagnosis not present

## 2024-04-29 DIAGNOSIS — Z888 Allergy status to other drugs, medicaments and biological substances status: Secondary | ICD-10-CM

## 2024-04-29 DIAGNOSIS — K649 Unspecified hemorrhoids: Secondary | ICD-10-CM | POA: Diagnosis present

## 2024-04-29 DIAGNOSIS — R7889 Finding of other specified substances, not normally found in blood: Secondary | ICD-10-CM | POA: Diagnosis not present

## 2024-04-29 DIAGNOSIS — Z792 Long term (current) use of antibiotics: Secondary | ICD-10-CM

## 2024-04-29 DIAGNOSIS — Z83438 Family history of other disorder of lipoprotein metabolism and other lipidemia: Secondary | ICD-10-CM

## 2024-04-29 DIAGNOSIS — Z808 Family history of malignant neoplasm of other organs or systems: Secondary | ICD-10-CM

## 2024-04-29 DIAGNOSIS — Z823 Family history of stroke: Secondary | ICD-10-CM

## 2024-04-29 LAB — CBC
HCT: 20.1 % — ABNORMAL LOW (ref 35.0–45.0)
HCT: 18.8 % — ABNORMAL LOW (ref 36.0–46.0)
Hemoglobin: 6 g/dL — CL (ref 11.7–15.5)
Hemoglobin: 5.7 g/dL — CL (ref 12.0–15.0)
MCH: 28.4 pg (ref 27.0–33.0)
MCH: 28.4 pg (ref 26.0–34.0)
MCHC: 29.9 g/dL — ABNORMAL LOW (ref 32.0–36.0)
MCHC: 30.3 g/dL (ref 30.0–36.0)
MCV: 95.3 fL (ref 80.0–100.0)
MCV: 93.5 fL (ref 80.0–100.0)
MPV: 12.5 fL (ref 7.5–12.5)
Platelets: 181 Thousand/uL (ref 140–400)
Platelets: 176 K/uL (ref 150–400)
RBC: 2.11 Million/uL — ABNORMAL LOW (ref 3.80–5.10)
RBC: 2.01 MIL/uL — ABNORMAL LOW (ref 3.87–5.11)
RDW: 18.8 % — ABNORMAL HIGH (ref 11.0–15.0)
RDW: 22.5 % — ABNORMAL HIGH (ref 11.5–15.5)
WBC: 10.1 Thousand/uL (ref 3.8–10.8)
WBC: 10.5 K/uL (ref 4.0–10.5)
nRBC: 0 % (ref 0.0–0.2)

## 2024-04-29 LAB — COMPREHENSIVE METABOLIC PANEL WITH GFR
AG Ratio: 1 (calc) (ref 1.0–2.5)
ALT: 59 U/L — ABNORMAL HIGH (ref 6–29)
ALT: 54 U/L — ABNORMAL HIGH (ref 0–44)
AST: 78 U/L — ABNORMAL HIGH (ref 10–35)
AST: 76 U/L — ABNORMAL HIGH (ref 15–41)
Albumin: 2 g/dL — ABNORMAL LOW (ref 3.6–5.1)
Albumin: 1.7 g/dL — ABNORMAL LOW (ref 3.5–5.0)
Alkaline Phosphatase: 403 U/L — ABNORMAL HIGH (ref 38–126)
Alkaline phosphatase (APISO): 442 U/L — ABNORMAL HIGH (ref 37–153)
Anion gap: 10 (ref 5–15)
BUN/Creatinine Ratio: 31 (calc) — ABNORMAL HIGH (ref 6–22)
BUN: 33 mg/dL — ABNORMAL HIGH (ref 7–25)
BUN: 32 mg/dL — ABNORMAL HIGH (ref 8–23)
CO2: 24 mmol/L (ref 20–32)
CO2: 21 mmol/L — ABNORMAL LOW (ref 22–32)
Calcium: 8.5 mg/dL — ABNORMAL LOW (ref 8.6–10.4)
Calcium: 8.7 mg/dL — ABNORMAL LOW (ref 8.9–10.3)
Chloride: 109 mmol/L (ref 98–110)
Chloride: 107 mmol/L (ref 98–111)
Creat: 1.06 mg/dL — ABNORMAL HIGH (ref 0.60–0.95)
Creatinine, Ser: 1.08 mg/dL — ABNORMAL HIGH (ref 0.44–1.00)
GFR, Estimated: 51 mL/min — ABNORMAL LOW (ref >60)
Globulin: 2.1 g/dL (ref 1.9–3.7)
Glucose, Bld: 117 mg/dL — ABNORMAL HIGH (ref 65–99)
Glucose, Bld: 88 mg/dL (ref 70–99)
Potassium: 5.6 mmol/L — ABNORMAL HIGH (ref 3.5–5.3)
Potassium: 5 mmol/L (ref 3.5–5.1)
Sodium: 135 mmol/L (ref 135–146)
Sodium: 138 mmol/L (ref 135–145)
Total Bilirubin: 0.4 mg/dL (ref 0.2–1.2)
Total Bilirubin: 0.4 mg/dL (ref 0.0–1.2)
Total Protein: 4.1 g/dL — ABNORMAL LOW (ref 6.1–8.1)
Total Protein: 4.4 g/dL — ABNORMAL LOW (ref 6.5–8.1)
eGFR: 52 mL/min/1.73m2 — ABNORMAL LOW (ref >=60)

## 2024-04-29 LAB — GLUCOSE, CAPILLARY: Glucose-Capillary: 130 mg/dL — ABNORMAL HIGH (ref 70–99)

## 2024-04-29 LAB — PREPARE RBC (CROSSMATCH)

## 2024-04-29 LAB — HEMOGLOBIN AND HEMATOCRIT, BLOOD
HCT: 27.3 % — ABNORMAL LOW (ref 36.0–46.0)
Hemoglobin: 9 g/dL — ABNORMAL LOW (ref 12.0–15.0)

## 2024-04-29 LAB — POC OCCULT BLOOD, ED: Fecal Occult Bld: POSITIVE — AB

## 2024-04-29 LAB — MRSA NEXT GEN BY PCR, NASAL: MRSA by PCR Next Gen: NOT DETECTED

## 2024-04-29 LAB — LIPASE, BLOOD: Lipase: 35 U/L (ref 11–51)

## 2024-04-29 MED ORDER — ONDANSETRON HCL 4 MG PO TABS: 4.0000 mg | ORAL_TABLET | Freq: Four times a day (QID) | ORAL | Status: DC | PRN

## 2024-04-29 MED ORDER — ONDANSETRON HCL 4 MG/2ML IJ SOLN: 4.0000 mg | Freq: Four times a day (QID) | INTRAMUSCULAR | Status: DC | PRN

## 2024-04-29 MED ORDER — PROPYLENE GLYCOL 0.6 % OP SOLN: 1.0000 [drp] | Freq: Three times a day (TID) | OPHTHALMIC | Status: DC | PRN

## 2024-04-29 MED ORDER — ROSUVASTATIN CALCIUM 20 MG PO TABS
40.0000 mg | ORAL_TABLET | Freq: Every day | ORAL | Status: DC
  Administered 2024-04-29: 40 mg via ORAL
  Filled 2024-04-29: qty 2

## 2024-04-29 MED ORDER — ACETAMINOPHEN 325 MG PO TABS: 650.0000 mg | ORAL_TABLET | Freq: Four times a day (QID) | ORAL | Status: DC | PRN

## 2024-04-29 MED ORDER — MIRTAZAPINE 15 MG PO TABS
15.0000 mg | ORAL_TABLET | Freq: Every day | ORAL | Status: DC
  Administered 2024-04-29 – 2024-04-30 (×2): 15 mg via ORAL
  Filled 2024-04-29 (×2): qty 1

## 2024-04-29 MED ORDER — DOXYCYCLINE HYCLATE 100 MG PO TABS
100.0000 mg | ORAL_TABLET | Freq: Two times a day (BID) | ORAL | Status: DC
  Administered 2024-04-29 – 2024-05-01 (×5): 100 mg via ORAL
  Filled 2024-04-29 (×5): qty 1

## 2024-04-29 MED ORDER — SODIUM CHLORIDE 0.9 % IV SOLN
1.0000 g | Freq: Two times a day (BID) | INTRAVENOUS | Status: DC
  Administered 2024-04-29 – 2024-05-01 (×5): 1 g via INTRAVENOUS
  Filled 2024-04-29 (×6): qty 20

## 2024-04-29 MED ORDER — SODIUM CHLORIDE 0.9% IV SOLUTION: Freq: Once | INTRAVENOUS | Status: DC

## 2024-04-29 MED ORDER — POLYVINYL ALCOHOL 1.4 % OP SOLN: 1.0000 [drp] | Freq: Three times a day (TID) | OPHTHALMIC | Status: DC | PRN

## 2024-04-29 MED ORDER — POLYETHYLENE GLYCOL 3350 17 G PO PACK: 17.0000 g | PACK | Freq: Every day | ORAL | Status: DC | PRN

## 2024-04-29 MED ORDER — HYDROCHLOROTHIAZIDE 12.5 MG PO TABS
12.5000 mg | ORAL_TABLET | Freq: Every day | ORAL | Status: DC
  Administered 2024-04-29: 12.5 mg via ORAL
  Filled 2024-04-29: qty 1

## 2024-04-29 MED ORDER — BRIMONIDINE TARTRATE 0.2 % OP SOLN
1.0000 [drp] | Freq: Two times a day (BID) | OPHTHALMIC | Status: DC
  Administered 2024-04-29 – 2024-05-01 (×5): 1 [drp] via OPHTHALMIC
  Filled 2024-04-29: qty 5

## 2024-04-29 MED ORDER — CHLORHEXIDINE GLUCONATE CLOTH 2 % EX PADS
6.0000 | MEDICATED_PAD | Freq: Every day | CUTANEOUS | Status: DC
  Administered 2024-04-29 – 2024-05-01 (×3): 6 via TOPICAL

## 2024-04-29 MED ORDER — EMPAGLIFLOZIN 25 MG PO TABS
25.0000 mg | ORAL_TABLET | Freq: Every day | ORAL | Status: DC
  Administered 2024-04-29: 25 mg via ORAL
  Filled 2024-04-29 (×2): qty 1

## 2024-04-29 MED ORDER — SODIUM CHLORIDE 0.9% FLUSH
10.0000 mL | INTRAVENOUS | Status: DC | PRN
  Administered 2024-05-01: 30 mL

## 2024-04-29 MED ORDER — PANTOPRAZOLE SODIUM 40 MG IV SOLR
40.0000 mg | Freq: Two times a day (BID) | INTRAVENOUS | Status: DC
  Administered 2024-04-29 – 2024-05-01 (×5): 40 mg via INTRAVENOUS
  Filled 2024-04-29 (×5): qty 10

## 2024-04-29 MED ORDER — ACETAMINOPHEN 650 MG RE SUPP: 650.0000 mg | Freq: Four times a day (QID) | RECTAL | Status: DC | PRN

## 2024-04-29 MED ORDER — METFORMIN HCL 500 MG PO TABS
1000.0000 mg | ORAL_TABLET | Freq: Two times a day (BID) | ORAL | Status: DC
  Administered 2024-04-29: 1000 mg via ORAL
  Filled 2024-04-29: qty 2

## 2024-04-29 MED ORDER — TRAZODONE HCL 100 MG PO TABS
100.0000 mg | ORAL_TABLET | Freq: Every day | ORAL | Status: DC
  Administered 2024-04-29 – 2024-04-30 (×2): 100 mg via ORAL
  Filled 2024-04-29 (×2): qty 1

## 2024-04-29 MED ORDER — PANTOPRAZOLE SODIUM 40 MG IV SOLR: 40.0000 mg | Freq: Two times a day (BID) | INTRAVENOUS | Status: DC

## 2024-04-29 MED ORDER — SODIUM CHLORIDE 0.9% FLUSH
10.0000 mL | Freq: Two times a day (BID) | INTRAVENOUS | Status: DC
  Administered 2024-04-29 – 2024-04-30 (×3): 10 mL

## 2024-04-29 MED ORDER — REPAGLINIDE 1 MG PO TABS
1.0000 mg | ORAL_TABLET | Freq: Two times a day (BID) | ORAL | Status: DC
  Administered 2024-04-29: 1 mg via ORAL
  Filled 2024-04-29 (×2): qty 1

## 2024-04-29 MED ORDER — MAGNESIUM OXIDE -MG SUPPLEMENT 400 (240 MG) MG PO TABS
400.0000 mg | ORAL_TABLET | Freq: Two times a day (BID) | ORAL | Status: DC
  Administered 2024-04-29 – 2024-05-01 (×5): 400 mg via ORAL
  Filled 2024-04-29 (×5): qty 1

## 2024-04-29 MED ORDER — METOPROLOL TARTRATE 25 MG PO TABS
25.0000 mg | ORAL_TABLET | Freq: Two times a day (BID) | ORAL | Status: DC
Start: 2024-04-29 — End: 2024-05-02
  Administered 2024-04-29 – 2024-05-01 (×5): 25 mg via ORAL
  Filled 2024-04-29 (×5): qty 1

## 2024-04-29 NOTE — Progress Notes (Signed)
 Pt declines air loss mattress.

## 2024-04-29 NOTE — Telephone Encounter (Signed)
 Called by [facility] re critical lab of hemoglobin of 6.0  I called patient and [facility] place as well  She will go to ER for blood transfusion

## 2024-04-29 NOTE — ED Triage Notes (Addendum)
 Pt BIB from [facility] by [facility] for low Hgb (6.0), v/s 125/45, HR, 70, RR 18, 18% RA, CBG 124, hx of anemia

## 2024-04-29 NOTE — H&P (Signed)
 History and Physical    Patient: [name] FMW:[id number] DOB: 1940-11-09 DOA: 04/29/2024 DOS: the patient was seen and examined on 04/29/2024 PCP: Chrystal Lamarr RAMAN, MD  Patient coming from: Home  Chief Complaint:  Chief Complaint  Patient presents with   abnormal labs   HPI: [name] is a 83 y.o. 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  No abdominal pain, nausea, emesis, diarrhea or constipation.  No flank pain, dysuria, frequency or hematuria. She denied fever, chills, rhinorrhea, sore throat, wheezing or hemoptysis.  No chest pain, palpitations, diaphoresis, PND, orthopnea or pitting edema of the lower extremities.   No polyuria, polydipsia, polyphagia or blurred vision.   Lab work: CMP showed CO2 of 21 mmol/L with a normal anion gap, the rest of the electrolytes were normal after calcium  correction.  Normal glucose and total bilirubin.  BUN was 32 and creatinine 1.08 mg/dL.  Total protein was 4.4 and albumin  1.7 g/dL.  AST 76, ALT 54 and alkaline phosphatase 403 units/L.  Lipase was normal.  Imaging: Portable 1 view chest radiograph   ED course: Initial vital signs were  temperature 97.7 F, pulse 101, respiration 18, BP 129/49 mmHg O2 sat 100% on room air.  The patient was ordered 2 units of PRBC by the emergency department.  Review of Systems: As mentioned in the history of present illness. All other systems reviewed and are negative. Past Medical History:  Diagnosis Date   Anemia    after CABG in june 2021   Arthritis    Cancer [facility])    removal from nose - MOSE procedure    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   Complication of anesthesia    VERSED - agitated, muscle spasms, jerking , frantic , (never had this occurence in the pas)    Coronary artery disease    Diabetes mellitus without complication (HCC)    Dysrhythmia    PVC's   GERD (gastroesophageal reflux disease)    Heart murmur    History of hiatal hernia    Hyperlipidemia    Hypertension 11/20/2011   ECHO- EF>55% Borderline concentric left ventricular hypertrophy. There is a small calcified mass in the L:A near the LA appendage. No valvular masses seen with associated mitral annular calcification. LA Volume/ BSA27.4 ml/m2 No AS. Right ventricular systolic pressure is elevated at .   Jaundice    Myocardial infarction [facility])    June 2021   Neuromuscular disorder (HCC)    neuropathy in bilateral feet   Peripheral vascular disease (HCC)    Pneumonia    not hosp.    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  PTCAs with drug coated balloon Lt ant tibial artery and Lt popliteal artery  11/26/2019   Past Surgical History:  Procedure Laterality Date   ABDOMINAL AORTAGRAM  11/25/2019  ABDOMINAL AORTOGRAM W/LOWER EXTREMITY    ABDOMINAL AORTOGRAM N/A 06/15/2019   Procedure: ABDOMINAL AORTOGRAM;  Surgeon: Court Dorn PARAS, MD;  Location: [facility] INVASIVE CV LAB;  Service: Cardiovascular;  Laterality: N/A;   ABDOMINAL AORTOGRAM W/LOWER EXTREMITY N/A 11/25/2019   Procedure: ABDOMINAL AORTOGRAM W/LOWER EXTREMITY;  Surgeon: Darron Deatrice LABOR, MD;  Location: [facility] INVASIVE CV LAB;   Service: Cardiovascular;  Laterality: N/A;  Lt leg   ABDOMINAL AORTOGRAM W/LOWER EXTREMITY N/A 08/01/2020   Procedure: ABDOMINAL AORTOGRAM W/LOWER EXTREMITY;  Surgeon: Court Dorn PARAS, MD;  Location: [facility] INVASIVE CV LAB;  Service: Cardiovascular;  Laterality: N/A;   ABDOMINAL AORTOGRAM W/LOWER EXTREMITY N/A 04/28/2021   Procedure: ABDOMINAL AORTOGRAM W/LOWER EXTREMITY;  Surgeon: Harvey Carlin BRAVO, MD;  Location: [facility] INVASIVE CV LAB;  Service: Cardiovascular;  Laterality: N/A;   ABDOMINAL AORTOGRAM W/LOWER EXTREMITY N/A 10/13/2021   Procedure: ABDOMINAL AORTOGRAM W/LOWER EXTREMITY;  Surgeon: Magda Debby SAILOR, MD;  Location: [facility] INVASIVE CV LAB;  Service: Cardiovascular;  Laterality: N/A;   ABDOMINAL AORTOGRAM W/LOWER EXTREMITY N/A 11/09/2022   Procedure: ABDOMINAL AORTOGRAM W/LOWER EXTREMITY;  Surgeon: Magda Debby SAILOR, MD;  [facility]: [facility] INVASIVE CV LAB;  Service: Cardiovascular;  Laterality: N/A;   ABDOMINAL AORTOGRAM W/LOWER EXTREMITY N/A 11/16/2022   Procedure: ABDOMINAL AORTOGRAM W/LOWER EXTREMITY;  Surgeon: Magda Debby SAILOR, MD;  [facility]: [facility] INVASIVE CV LAB;  Service: Cardiovascular;  Laterality: N/A;   AMPUTATION TOE Left 05/03/2021   Procedure: AMPUTATION OF THIRD LEFT  TOE;  Surgeon: Magda Debby SAILOR, MD;  Location: [facility] OR;  Service: Vascular;  Laterality: Left;   APPENDECTOMY     APPLICATION OF WOUND VAC Right 07/21/2019   Procedure: Application Of Prevena Wound Vac Right Groin;  Surgeon: Serene Gaile ORN, MD;  Location: [facility] OR;  Service: Vascular;  Laterality: Right;   BILIARY BRUSHING  04/07/2023   Procedure: BILIARY BRUSHING;  Surgeon: Rosalie Kitchens, MD;  Location: [facility] ENDOSCOPY;  Service: Gastroenterology;;   BILIARY STENT PLACEMENT  04/07/2023   Procedure: BILIARY STENT PLACEMENT;  Surgeon: Rosalie Kitchens, MD;  Location: [facility] ENDOSCOPY;  Service: Gastroenterology;;   CARPAL TUNNEL RELEASE Left    CARPAL TUNNEL RELEASE Right    CESAREAN SECTION     x 2   CHOLECYSTECTOMY N/A 10/12/2022    Procedure: LAPAROSCOPIC PARTIAL FENESTRATING CHOLECYSTECTOMY;  Surgeon: Dasie Leonor CROME, MD;  [facility] OR;  Service: General;  Laterality: N/A;   COLONOSCOPY     CORONARY ARTERY BYPASS GRAFT N/A 03/24/2020   Procedure: CORONARY ARTERY BYPASS GRAFTING (CABG) using LIMA to LAD (m); RIMA to RAMUS; Endoscopic Right Greater Saphenous Vein: SVG to Diag1; SVG to PLB (right); and SVG to PL (left).;  Surgeon: German Bartlett PEDLAR, MD;  Location: [facility] OR;  Service: Open Heart Surgery;  Laterality: N/A;  BILATERAL IMA   ENDARTERECTOMY FEMORAL Right 07/21/2019   Procedure: RIGHT ENDARTERECTOMY FEMORAL WITH PATCH ANGIOPLASTY;  Surgeon: Serene Gaile ORN, MD;  [facility] OR;  Service: Vascular;  Laterality: Right;   ENDARTERECTOMY FEMORAL Right 08/16/2020   Procedure: RIGHT FEMORAL ENDARTERECTOMY;  Surgeon: Sheree Penne Bruckner, MD;  Location[facility] OR;  Service: Vascular;  Laterality: Right;   ENDOVEIN HARVEST OF GREATER SAPHENOUS VEIN Right 03/24/2020   Procedure: ENDOVEIN HARVEST OF GREATER SAPHENOUS VEIN;  Surgeon: German Bartlett PEDLAR, MD;  [facility] OR;  Service: Open Heart Surgery;  Laterality: Right;   ERCP N/A 04/07/2023   Procedure: ENDOSCOPIC RETROGRADE CHOLANGIOPANCREATOGRAPHY (ERCP);  Surgeon: Rosalie Kitchens, MD;  Location: [facility] ENDOSCOPY;  Service: Gastroenterology;  Laterality: N/A;   EYE  SURGERY     cataract removal bilaterally   FEMORAL-POPLITEAL BYPASS GRAFT Right 08/16/2020   Procedure: BYPASS GRAFT FEMORAL-POPLITEAL ARTERY;  Surgeon: Sheree Penne Bruckner, MD;  Location: [facility] OR;  Service: Vascular;  Laterality: Right;   FEMORAL-POPLITEAL BYPASS GRAFT Left 05/03/2021   Procedure: LEFT FEMORAL TO BELOW KNEE POPLITEAL ARTERY BYPASS GRAFTING WITH 6MMX80 PTFE GRAFT;  Surgeon: Magda Debby SAILOR, MD;  [facility] OR;  Service: Vascular;  Laterality: Left;   LEFT HEART CATH AND CORONARY ANGIOGRAPHY N/A 03/22/2020   Procedure: LEFT HEART CATH AND CORONARY ANGIOGRAPHY;  Surgeon: [place], Peter M, MD;   Location: [facility] INVASIVE CV LAB;  Service: Cardiovascular;  Laterality: N/A;   LOWER EXTREMITY ANGIOGRAPHY Bilateral 06/15/2019   Procedure: Lower Extremity Angiography;  Surgeon: Court Dorn PARAS, MD;  Location: [facility] INVASIVE CV LAB;  Service: Cardiovascular;  Laterality: Bilateral;   LOWER EXTREMITY ANGIOGRAPHY Right 08/03/2019   Procedure: LOWER EXTREMITY ANGIOGRAPHY;  Surgeon: Court Dorn PARAS, MD;  [facility] LAB;  Service: Cardiovascular;  Laterality: Right;   LOWER EXTREMITY ANGIOGRAPHY N/A 09/07/2021   Procedure: LOWER EXTREMITY ANGIOGRAPHY;  Surgeon: Gretta Bruckner PARAS, MD;  [facility] INVAS[facility] CV LAB;  Service: Cardiovascular;  Laterality: N/A;   PATCH ANGIOPLASTY Right 07/21/2019   Procedure: Patch Angioplasty Right Femoral Artery;  Surgeon: Serene Gaile ORN, MD;  Location: [facility] OR;  Service: Vascular;  Laterality: Right;   PERIPHERAL INTRAVASCULAR LITHOTRIPSY  11/25/2019   Procedure: INTRAVASCULAR LITHOTRIPSY;  Surgeon: Darron Deatrice LABOR, MD;  [facility] LAB;  Service: Cardiovascular;;  LT. SFA   PERIPHERAL VASCULAR ATHERECTOMY  08/03/2019   Procedure: PERIPHERAL VASCULAR ATHERECTOMY;  Surgeon: Court Dorn PARAS, MD;  Location: [facility] INVASIVE CV LAB;  Service: Cardiovascular;;  right SFA, right TP trunk   PERIPHERAL VASCULAR ATHERECTOMY  11/25/2019   Procedure: PERIPHERAL VASCULAR ATHERECTOMY;  Surgeon: Darron Deatrice LABOR, MD;  [facility];  Service: Cardiovascular;;  Lt.  POPLITEAL and AT   PERIPHERAL VASCULAR BALLOON ANGIOPLASTY Left 06/15/2019   Procedure: PERIPHERAL VASCULAR BALLOON ANGIOPLASTY;  Surgeon: Court Dorn PARAS, MD;  [facility] LAB;  Service: Cardiovascular;  Laterality: Left;  SFA UNSUCCESSFUL UNABLE TO CROSS LESION   PERIPHERAL VASCULAR BALLOON ANGIOPLASTY  08/03/2019   Procedure: PERIPHERAL VASCULAR BALLOON ANGIOPLASTY;  Surgeon: Court Dorn PARAS, MD;  [facility];  Service: Cardiovascular;;   right SFA, Right TP trunk   PERIPHERAL VASCULAR BALLOON ANGIOPLASTY  09/07/2021   Procedure: PERIPHERAL VASCULAR BALLOON ANGIOPLASTY;  Surgeon: Gretta Bruckner PARAS, MD;  [facility] LAB;  Service: Cardiovascular;;   PERIPHERAL VASCULAR BALLOON ANGIOPLASTY Right 10/13/2021   Procedure: PERIPHERAL VASCULAR BALLOON ANGIOPLASTY;  Surgeon: Magda Debby SAILOR, MD;  [facility] CV LAB;  Service: Cardiovascular;  Laterality: Right;   PERIPHERAL VASCULAR BALLOON ANGIOPLASTY  11/09/2022   Procedure: PERIPHERAL VASCULAR BALLOON ANGIOPLASTY;  Surgeon: Magda Debby SAILOR, MD;  [facility] INVASIVE CV LAB;  Service: Cardiovascular;;  fem-pop bypass and AT   PORTACATH PLACEMENT Left 04/29/2023   Procedure: INSERTION PORT-A-CATH;  Surgeon: Dasie Leonor CROME, MD;  Location: [facility] ORS;  Service: General;  Laterality: Left;   PORTACATH PLACEMENT N/A 07/12/2023   Procedure: REVISION PORT-A-CATH;  Surgeon: Dasie Leonor CROME, MD;  Location: [facility] OR;  Service: General;  Laterality: N/A;   REMOVAL OF STONES  04/07/2023   Procedure: REMOVAL OF STONES;  Surgeon: Rosalie Kitchens, MD;  Location: [facility] ENDOSCOPY;  Service: Gastroenterology;;   ANNETT  04/07/2023   Procedure: SPHINCTEROTOMY;  Surgeon: Rosalie Kitchens, MD;  Location: [facility] ENDOSCOPY;  Service: Gastroenterology;;   TEE WITHOUT CARDIOVERSION N/A 03/24/2020   Procedure: TRANSESOPHAGEAL ECHOCARDIOGRAM (TEE);  Surgeon: German Bartlett PEDLAR, MD;  Location: [facility] OR;  Service: Open Heart Surgery;  Laterality: N/A;   TONSILLECTOMY     and adenoidectomy   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/2019   Procedure: TRANSMETATARSAL AMPUTATION;  Surgeon: Gershon Donnice SAUNDERS, DPM;  Location: [facility] OR;  Service: Podiatry;  Laterality: Right;   TRANSMETATARSAL AMPUTATION Left 05/11/2021   Procedure: LEFT TRANSMETATARSAL AMPUTATION;  Surgeon: Sheree Penne Bruckner, MD;  Location: [facility] OR;  Service: Vascular;  Laterality: Left;   TUBAL LIGATION     Social History:  reports that she  quit smoking about 52 years ago. Her smoking use included cigarettes. She has never used smokeless tobacco. She reports that she does not drink alcohol  and does not use drugs.  Allergies  Allergen Reactions   Versed  [Midazolam ] Anxiety    Frantic, out of my mind, agitated    Bactrim  [Sulfamethoxazole -Trimethoprim ] Other (See Comments)    ^ K+( elevated)    Augmentin [Amoxicillin-Pot Clavulanate] Other (See Comments)    Elevates potassium level   Demerol [Meperidine]     Delusional    Meperidine Hcl     Other reaction(s): delusional   Scopolamine     Delusional   Patch *   Tramadol  Other (See Comments)    Keeps awake    Family History  Problem Relation Age of Onset   Cancer - Prostate Father    Cancer - Colon Father    Stroke Mother    Hypertension Mother    Hyperlipidemia Mother    Melanoma Brother     Prior to Admission medications   Medication Sig Start Date End Date Taking? Authorizing Provider  acetaminophen  (TYLENOL ) 500 MG tablet Take 1,000 mg by mouth every 6 (six) hours as needed for moderate pain (pain score 4-6), fever or mild pain (pain score 1-3).   Yes [provider]  brimonidine  (ALPHAGAN ) 0.2 % ophthalmic solution Place 1 drop into the right eye 2 (two) times daily. 01/19/21  Yes [provider]  doxycycline  (VIBRA -TABS) 100 MG tablet Take 1 tablet (100 mg total) by mouth every 12 (twelve) hours. 04/16/24  Yes Shalhoub, Zachary PARAS, MD  ferrous sulfate  325 (65 FE) MG EC tablet Take 1 tablet (325 mg total) by mouth daily before breakfast. To be taken with a cup of orange juice on an otherwise empty stomach if possible 04/16/24 04/16/25 Yes Shalhoub, Zachary PARAS, MD  HUMALOG  MIX 75/25 KWIKPEN (75-25) 100 UNIT/ML KwikPen Inject 6 Units into the skin daily. 04/20/24  Yes [provider]  hydrochlorothiazide  (HYDRODIURIL ) 12.5 MG tablet Take 12.5 mg by mouth daily.   Yes [provider]  JARDIANCE  25 MG TABS tablet Take 25 mg by mouth daily.  04/20/24  Yes [provider]  magnesium  oxide (MAG-OX) 400 (240 Mg) MG tablet Take 400 mg by mouth 2 (two) times daily.   Yes [provider]  meropenem  1 g in sodium chloride  0.9 % 100 mL Inject 1 g into the vein every 12 (twelve) hours. 04/16/24  Yes Shalhoub, Zachary PARAS, MD  Metformin  HCl 500 MG/5ML SOLN Take 10 mLs by mouth in the morning and at bedtime. 04/20/24  Yes [provider]  metoprolol  tartrate (LOPRESSOR ) 25 MG tablet Take 1 tablet (25 mg total) by mouth 2 (two) times daily. 02/18/24  Yes Krishnan, Gokul, MD  mirtazapine  (REMERON ) 7.5 MG tablet Take  15 mg by mouth at bedtime.   Yes [provider]  pantoprazole  (PROTONIX ) 20 MG tablet Take 20 mg by mouth daily. 03/25/24  Yes [provider]  polyethylene glycol (MIRALAX  / GLYCOLAX ) 17 g packet Take 17 g by mouth daily. Patient taking differently: Take 17 g by mouth daily as needed. 02/19/24  Yes Krishnan, Gokul, MD  potassium chloride  (MICRO-K ) 10 MEQ CR capsule Take 10 mEq by mouth 2 (two) times daily.   Yes [provider]  Probiotic Product (PROBIOTIC PO) Take 1 capsule by mouth daily.   Yes [provider]  Propylene Glycol (SYSTANE BALANCE) 0.6 % SOLN Place 1 drop into the left eye 3 (three) times daily as needed (dry/irritated eyes).   Yes [provider]  repaglinide  (PRANDIN ) 1 MG tablet Take 1 mg by mouth 2 (two) times daily before a meal.   Yes [provider]  rivaroxaban  (XARELTO ) 10 MG TABS tablet Take 1 tablet (10 mg total) by mouth daily. 04/25/24  Yes Shalhoub, Zachary PARAS, MD  rosuvastatin  (CRESTOR ) 40 MG tablet Take 1 tablet (40 mg total) by mouth daily. 02/06/24 05/06/24 Yes Monge, Damien BROCKS, NP  telmisartan  (MICARDIS ) 40 MG tablet Take 40 mg by mouth daily. 03/27/24  Yes [provider]  traZODone  (DESYREL ) 100 MG tablet Take 100 mg by mouth at bedtime. 03/12/24  Yes [provider]  feeding supplement (ENSURE ENLIVE / ENSURE PLUS) LIQD  Take 237 mLs by mouth 2 (two) times daily between meals. Patient not taking: Reported on 04/29/2024 02/18/24   Verdene Purchase, MD    Physical Exam: Vitals:   04/29/24 0615 04/29/24 0630 04/29/24 0645 04/29/24 0700  BP: (!) 134/54 (!) 115/46 (!) 119/51 (!) 107/51  Pulse: 85  75 74  Resp: 16 (!) 5 11 12   Temp: (!) 97.5 F (36.4 C)     TempSrc: Oral     SpO2:   100% 100%  Weight:      Height:       Physical Exam Vitals reviewed.  Constitutional:      General: She is awake. She is not in acute distress.    Appearance: She is ill-appearing.  HENT:     Head: Normocephalic.     Nose: No rhinorrhea.     Mouth/Throat:     Mouth: Mucous membranes are dry.  Eyes:     General: No scleral icterus.    Pupils: Pupils are equal, round, and reactive to light.  Neck:     Vascular: No JVD.  Cardiovascular:     Rate and Rhythm: Normal rate and regular rhythm.     Heart sounds: S1 normal and S2 normal.  Pulmonary:     Effort: Pulmonary effort is normal.     Breath sounds: Normal breath sounds. No wheezing, rhonchi or rales.  Abdominal:     General: Bowel sounds are normal. There is no distension.     Palpations: Abdomen is soft.     Tenderness: There is no abdominal tenderness. There is no right CVA tenderness or left CVA tenderness.  Musculoskeletal:     Cervical back: Neck supple.     Right lower leg: Edema present.     Left lower leg: Edema present.  Skin:    General: Skin is warm and dry.     Coloration: Skin is pale.     Comments: Wound VAC in place.  Neurological:     Mental Status: She is alert and oriented to person, place, and time.  Psychiatric:        Mood and Affect: Mood normal.        Behavior: Behavior normal. Behavior is cooperative.     Data Reviewed:  Results are pending, will review when available. 02/13/2024 echocardiogram report. IMPRESSIONS:   1. Left ventricular ejection fraction, by estimation, is 55 to 60%. The  left ventricle has normal function.  The left ventricle has no regional  wall motion abnormalities. Left ventricular diastolic parameters were  normal.   2. Right ventricular systolic function is mildly reduced. The right  ventricular size is mildly enlarged.   3. The mitral valve is grossly normal. Trivial mitral valve  regurgitation. No evidence of mitral stenosis.   4. The aortic valve is normal in structure. There is moderate  calcification of the aortic valve. There is moderate thickening of the  aortic valve. Aortic valve regurgitation is not visualized. No aortic  stenosis is present.   5. The inferior vena cava is normal in size with greater than 50%  respiratory variability, suggesting right atrial pressure of 3 mmHg.   EKG: Vent. rate 100 BPM PR interval 172 ms QRS duration 133 ms QT/QTcB 391/505 ms P-R-T axes 127 -76 78 Sinus tachycardia RBBB and LAFB  Assessment and Plan: Principal Problem:   Symptomatic anemia Acute on chronic anemia Likely due to GI and sacral ulcer loss. Telemetry/observation. Hold rivaroxaban . Continue pantoprazole  twice daily. Monitor H&H. Transfuse further as needed. GI did not recommend procedure on previous admission. Discussed risk and benefits of anticoagulation.  Active Problems:   Paroxysmal atrial fibrillation (HCC) Currently on sinus rhythm. Will hold DOAC and patient will discuss with primary doctor Continue metoprolol  tartrate 25 mg p.o. twice daily.    Pressure injury of skin Continue wound care. Continue meropenem  and doxycycline  until 05/09/2023.    Essential hypertension Continue metoprolol  as above. Continue HCTZ 12.5 mg p.o. daily.    Dyslipidemia Continue rosuvastatin  40 mg p.o. daily.    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 Continue Prandin  1 mg p.o. twice daily. CBG monitoring before meals. Check hemoglobin A1c.    Carotid artery disease   Peripheral vascular  disease. Will likely need antiplatelet therapy. Continue statin as above.    Chronic kidney disease, stage 3a (HCC) Monitor renal function and electrolytes.    Cholangiocarcinoma (HCC) Follow-up at the cancer center as scheduled.     Advance Care Planning:   Code Status: Full Code   Consults:   Family Communication:   Severity of Illness: The appropriate patient status for this patient is OBSERVATION. Observation status is judged to be reasonable and necessary in order to provide the required intensity of service to ensure the patient's safety. The patient's presenting symptoms, physical exam findings, and initial radiographic and laboratory data in the context of their medical condition is felt to place them at decreased risk for further clinical deterioration. Furthermore, it is anticipated that the patient will be medically stable for discharge from the hospital within 2 midnights of admission.   Author: Alm Dorn Castor, MD 04/29/2024 8:00 AM  For on call review [email or web address].   This document was prepared using Dragon voice recognition software and may contain some unintended transcription errors.

## 2024-04-29 NOTE — Medical Student Note (Incomplete)
 [facility]-EMERGENCY DEPT Provider Student Note For educational purposes for Medical, PA and NP students only and not part of the legal medical record.   CSN: [id number] Arrival date & time: 04/29/24  0130      History   Chief Complaint Chief Complaint  Patient presents with  . abnormal labs    HPI [name] is a 83 y.o. female. Presents w/ CC of anemia   HPI  Past Medical History:  Diagnosis Date  . 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 Arthritis   . Cancer (HCC)    removal from nose - MOSE procedure   . 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 Complication of anesthesia    VERSED - agitated, muscle spasms, jerking , frantic , (never had this occurence in the pas)   . Coronary artery disease   . Diabetes mellitus without complication (HCC)   . Dysrhythmia    PVC's  . GERD (gastroesophageal reflux disease)   . Heart murmur   . History of hiatal hernia   . Hyperlipidemia   . Hypertension 11/20/2011   ECHO- EF>55% Borderline concentric left ventricular hypertrophy. There is a small calcified mass in the L:A near the LA appendage. No valvular masses seen with associated mitral annular calcification. LA Volume/ BSA27.4 ml/m2 No AS. Right ventricular systolic pressure is elevated at .  . Jaundice   . Myocardial infarction [facility])    June 2021  . Neuromuscular disorder (HCC)    neuropathy in bilateral feet  . Peripheral vascular disease (HCC)   . Pneumonia    not hosp.   . 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  PTCAs with drug coated balloon Lt ant tibial artery and Lt popliteal artery  11/26/2019    Patient Active Problem List   Diagnosis Date Noted  . Medication management 04/28/2024  . 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 Anemia 04/15/2024  . 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  . Pressure injury of sacral region, stage 4 (HCC) 04/15/2024  . 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 Acute diarrhea 04/15/2024  . Acute on chronic  anemia 04/14/2024  . Paroxysmal atrial fibrillation (HCC) 04/14/2024  . 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 Hyperlipidemia associated with type 2 diabetes mellitus (HCC) 04/14/2024  . Severe sepsis (HCC) 02/12/2024  . Pneumonia 02/12/2024  . Transaminitis 02/12/2024  . Atrial fibrillation with RVR (HCC) 02/12/2024  . Metabolic acidosis 02/12/2024  . Port-A-Cath in place 05/02/2023  . 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 Coronary artery disease involving native heart without angina pectoris 04/05/2023  . Jaundice 04/04/2023  . Cholelithiasis 10/12/2022  . Symptomatic cholelithiasis 10/12/2022  . Family history of malignant neoplasm of digestive organ 02/17/2022  . Gangrenous disorder (HCC) 02/17/2022  . Gastro-esophageal reflux disease without esophagitis 02/17/2022  . Hyperglycemia due to type 2 diabetes mellitus (HCC) 02/17/2022  . Iron  deficiency anemia 02/17/2022  . Leukocytosis 02/17/2022  . 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 Other acute osteomyelitis, left ankle and foot (HCC) 02/17/2022  . Pure hypercholesterolemia 02/17/2022  . Skin ulcer of sacrum, limited to breakdown of skin (HCC) 02/17/2022  . Thrombocytopenic disorder (HCC) 02/17/2022  . Tinnitus 02/17/2022  . S/P angiogram of extremity 10/13/2021  . Osteomyelitis of foot, left, acute (HCC) 04/27/2021  . Cellulitis in diabetic foot (HCC) 04/27/2021  . AKI (acute kidney injury) (HCC) 04/27/2021  . Hyponatremia 04/27/2021  .  Symptomatic anemia 04/27/2021  . Cellulitis of left lower extremity   . 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 Controlled diabetes mellitus with severe nonproliferative retinopathy of both eyes (HCC) 05/09/2020  . Left epiretinal membrane 05/09/2020  . 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 NSTEMI (non-ST elevated myocardial infarction) (HCC) 03/22/2020  .  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  PTCAs with drug coated balloon Lt ant tibial artery and Lt popliteal artery  11/26/2019  . Gangrene of foot (HCC) 08/07/2019  . Normocytic anemia 08/07/2019  . 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 Critical lower limb ischemia (HCC) 08/03/2019  . Encounter for immunization 07/09/2016  . Essential hypertension 03/31/2013  . Dyslipidemia 03/31/2013  . Type 2 diabetes mellitus with stage 3a chronic kidney disease, without long-term current use of insulin  (HCC) 03/31/2013  . Overweight (BMI 25.0-29.9) 03/31/2013  . 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 PAD (peripheral artery disease) (HCC) 03/31/2013    Past Surgical History:  Procedure Laterality Date  . ABDOMINAL AORTAGRAM  11/25/2019   ABDOMINAL AORTOGRAM W/LOWER EXTREMITY   . ABDOMINAL AORTOGRAM N/A 06/15/2019   Procedure: ABDOMINAL AORTOGRAM;  Surgeon: Court Dorn PARAS, MD;  Location: [facility] INVASIVE CV LAB;  Service: Cardiovascular;  Laterality: N/A;  . ABDOMINAL AORTOGRAM W/LOWER EXTREMITY N/A 11/25/2019   Procedure: ABDOMINAL AORTOGRAM W/LOWER EXTREMITY;  Surgeon: Darron Deatrice LABOR, MD;  Location: [facility] INVASIVE CV LAB;  Service: Cardiovascular;  Laterality: N/A;  Lt leg  . ABDOMINAL AORTOGRAM W/LOWER EXTREMITY N/A 08/01/2020   Procedure: ABDOMINAL AORTOGRAM W/LOWER EXTREMITY;  Surgeon: Court Dorn PARAS, MD;  Location: [facility] INVASIVE CV LAB;  Service: Cardiovascular;  Laterality: N/A;  . ABDOMINAL AORTOGRAM W/LOWER EXTREMITY N/A 04/28/2021   Procedure: ABDOMINAL AORTOGRAM W/LOWER EXTREMITY;  Surgeon: Harvey Carlin BRAVO, MD;  Location: [facility] INVASIVE CV LAB;  Service: Cardiovascular;  Laterality: N/A;  . ABDOMINAL AORTOGRAM W/LOWER EXTREMITY N/A 10/13/2021   Procedure: ABDOMINAL AORTOGRAM W/LOWER EXTREMITY;  Surgeon: Magda Debby SAILOR, MD;  Location: [facility] INVASIVE CV LAB;  Service: Cardiovascular;  Laterality: N/A;  . ABDOMINAL AORTOGRAM  W/LOWER EXTREMITY N/A 11/09/2022   Procedure: ABDOMINAL AORTOGRAM W/LOWER EXTREMITY;  Surgeon: Magda Debby SAILOR, MD;  Location: [facility] INVASIVE CV LAB;  Service: Cardiovascular;  Laterality: N/A;  . ABDOMINAL AORTOGRAM W/LOWER EXTREMITY N/A 11/16/2022   Procedure: ABDOMINAL AORTOGRAM W/LOWER EXTREMITY;  Surgeon: Magda Debby SAILOR, MD;  Location: [facility] INVASIVE CV LAB;  Service: Cardiovascular;  Laterality: N/A;  . AMPUTATION TOE Left 05/03/2021   Procedure: AMPUTATION OF THIRD LEFT  TOE;  Surgeon: Magda Debby SAILOR, MD;  Location: [facility] OR;  Service: Vascular;  Laterality: Left;  . APPENDECTOMY    . APPLICATION OF WOUND VAC Right 07/21/2019   Procedure: Application Of Prevena Wound Vac Right Groin;  Surgeon: Serene Gaile ORN, MD;  Location: [facility] OR;  Service: Vascular;  Laterality: Right;  . BILIARY BRUSHING  04/07/2023   Procedure: BILIARY BRUSHING;  Surgeon: Rosalie Kitchens, MD;  Location: [facility] ENDOSCOPY;  Service: Gastroenterology;;  . BILIARY STENT PLACEMENT  04/07/2023   Procedure: BILIARY STENT PLACEMENT;  Surgeon: Rosalie Kitchens, MD;  Location: [facility] ENDOSCOPY;  Service: Gastroenterology;;  . CARPAL TUNNEL RELEASE Left   . CARPAL TUNNEL RELEASE Right   . CESAREAN SECTION     x 2  . CHOLECYSTECTOMY N/A 10/12/2022   Procedure: LAPAROSCOPIC PARTIAL FENESTRATING CHOLECYSTECTOMY;  Surgeon: Dasie Leonor CROME, MD;  Location: [facility] OR;  Service: General;  Laterality: N/A;  .  COLONOSCOPY    . CORONARY ARTERY BYPASS GRAFT N/A 03/24/2020   Procedure: CORONARY ARTERY BYPASS GRAFTING (CABG) using LIMA to LAD (m); RIMA to RAMUS; Endoscopic Right Greater Saphenous Vein: SVG to Diag1; SVG to PLB (right); and SVG to PL (left).;  Surgeon: German Bartlett PEDLAR, MD;  Location: [facility] OR;  Service: Open Heart Surgery;  Laterality: N/A;  BILATERAL IMA  . ENDARTERECTOMY FEMORAL Right 07/21/2019   Procedure: RIGHT ENDARTERECTOMY FEMORAL WITH PATCH ANGIOPLASTY;  Surgeon: Serene Gaile ORN, MD;  Location: [facility] OR;  Service: Vascular;  Laterality: Right;  .  ENDARTERECTOMY FEMORAL Right 08/16/2020   Procedure: RIGHT FEMORAL ENDARTERECTOMY;  Surgeon: Sheree Penne Bruckner, MD;  Location: [facility] OR;  Service: Vascular;  Laterality: Right;  . ENDOVEIN HARVEST OF GREATER SAPHENOUS VEIN Right 03/24/2020   Procedure: ENDOVEIN HARVEST OF GREATER SAPHENOUS VEIN;  Surgeon: German Bartlett PEDLAR, MD;  Location: [facility] OR;  Service: Open Heart Surgery;  Laterality: Right;  . ERCP N/A 04/07/2023   Procedure: ENDOSCOPIC RETROGRADE CHOLANGIOPANCREATOGRAPHY (ERCP);  Surgeon: Rosalie Kitchens, MD;  Location[facility] ENDOSCOPY;  Service: Gastroenterology;  Laterality: N/A;  . EYE SURGERY     cataract removal bilaterally  . FEMORAL-POPLITEAL BYPASS GRAFT Right 08/16/2020   Procedure: BYPASS GRAFT FEMORAL-POPLITEAL ARTERY;  Surgeon: Sheree Penne Bruckner, MD;  Location: [facility] OR;  Service: Vascular;  Laterality: Right;  . FEMORAL-POPLITEAL BYPASS GRAFT Left 05/03/2021   Procedure: LEFT FEMORAL TO BELOW KNEE POPLITEAL ARTERY BYPASS GRAFTING WITH 6MMX80 PTFE GRAFT;  Surgeon: Magda Debby SAILOR, MD;  Location: [facility] OR;  Service: Vascular;  Laterality: Left;  . LEFT HEART CATH AND CORONARY ANGIOGRAPHY N/A 03/22/2020   Procedure: LEFT HEART CATH AND CORONARY ANGIOGRAPHY;  Surgeon: [place]azil[place], Peter M, MD;  Location: [facility] INVASIVE CV LAB;  Service: Cardiovascular;  Laterality: N/A;  . LOWER EXTREMITY ANGIOGRAPHY Bilateral 06/15/2019   Procedure: Lower Extremity Angiography;  Surgeon: Court Dorn PARAS, MD;  Location: [facility] INVASIVE CV LAB;  Service: Cardiovascular;  Laterality: Bilateral;  . LOWER EXTREMITY ANGIOGRAPHY Right 08/03/2019   Procedure: LOWER EXTREMITY ANGIOGRAPHY;  Surgeon: Court Dorn PARAS, MD;  [facility] INVASIVE CV LAB;  Service: Cardiovascular;  Laterality: Right;  . LOWER EXTREMITY ANGIOGRAPHY N/A 09/07/2021   Procedure: LOWER EXTREMITY ANGIOGRAPHY;  Surgeon: Gretta Bruckner PARAS, MD;  Location: [facility] INVASIVE CV LAB;  Service: Cardiovascular;  Laterality: N/A;  . PATCH ANGIOPLASTY  Right 07/21/2019   Procedure: Patch Angioplasty Right Femoral Artery;  Surgeon: Serene Gaile ORN, MD;  Location: [facility] OR;  Service: Vascular;  Laterality: Right;  . PERIPHERAL INTRAVASCULAR LITHOTRIPSY  11/25/2019   Procedure: INTRAVASCULAR LITHOTRIPSY;  Surgeon: Darron Deatrice LABOR, MD;  Location: [facility] INVASIVE CV LAB;  Service: Cardiovascular;;  LT. SFA  . PERIPHERAL VASCULAR ATHERECTOMY  08/03/2019   Procedure: PERIPHERAL VASCULAR ATHERECTOMY;  Surgeon: Court Dorn PARAS, MD;  Location: [facility] INVASIVE CV LAB;  Service: Cardiovascular;;  right SFA, right TP trunk  . PERIPHERAL VASCULAR ATHERECTOMY  11/25/2019   Procedure: PERIPHERAL VASCULAR ATHERECTOMY;  Surgeon: Darron Deatrice LABOR, MD;  Location: [facility] INVASIVE CV LAB;  Service: Cardiovascular;;  Lt.  POPLITEAL and AT  . PERIPHERAL VASCULAR BALLOON ANGIOPLASTY Left 06/15/2019   Procedure: PERIPHERAL VASCULAR BALLOON ANGIOPLASTY;  Surgeon: Court Dorn PARAS, MD;  Location: [facility] INVASIVE CV LAB;  Service: Cardiovascular;  Laterality: Left;  SFA UNSUCCESSFUL UNABLE TO CROSS LESION  . PERIPHERAL VASCULAR BALLOON ANGIOPLASTY  08/03/2019   Procedure: PERIPHERAL VASCULAR BALLOON ANGIOPLASTY;  Surgeon: Court Dorn PARAS, MD;  Location: [facility] INVASIVE CV LAB;  Service: Cardiovascular;;  right  SFA, Right TP trunk  . PERIPHERAL VASCULAR BALLOON ANGIOPLASTY  09/07/2021   Procedure: PERIPHERAL VASCULAR BALLOON ANGIOPLASTY;  Surgeon: Gretta Lonni PARAS, MD;  Location: [facility] INVASIVE CV LAB;  Service: Cardiovascular;;  . PERIPHERAL VASCULAR BALLOON ANGIOPLASTY Right 10/13/2021   Procedure: PERIPHERAL VASCULAR BALLOON ANGIOPLASTY;  Surgeon: Magda Debby SAILOR, MD;  Location[facility] INVASIVE CV LAB;  Service: Cardiovascular;  Laterality: Right;  . PERIPHERAL VASCULAR BALLOON ANGIOPLASTY  11/09/2022   Procedure: PERIPHERAL VASCULAR BALLOON ANGIOPLASTY;  Surgeon: Magda Debby SAILOR, MD;  Location: [facility] INVASIVE CV LAB;  Service: Cardiovascular;;  fem-pop bypass and AT  . PORTACATH PLACEMENT  Left 04/29/2023   Procedure: INSERTION PORT-A-CATH;  Surgeon: Dasie Leonor CROME, MD;  Location: [facility] ORS;  Service: General;  Laterality: Left;  . PORTACATH PLACEMENT N/A 07/12/2023   Procedure: REVISION PORT-A-CATH;  Surgeon: Dasie Leonor CROME, MD;  Location: [facility] OR;  Service: General;  Laterality: N/A;  . REMOVAL OF STONES  04/07/2023   Procedure: REMOVAL OF STONES;  Surgeon: Rosalie Kitchens, MD;  Location: [facility] ENDOSCOPY;  Service: Gastroenterology;;  . ANNETT  04/07/2023   Procedure: ANNETT;  Surgeon: Rosalie Kitchens, MD;  Location: [facility] ENDOSCOPY;  Service: Gastroenterology;;  . TEE WITHOUT CARDIOVERSION N/A 03/24/2020   Procedure: TRANSESOPHAGEAL ECHOCARDIOGRAM (TEE);  Surgeon: German Bartlett PEDLAR, MD;  Location: [facility] OR;  Service: Open Heart Surgery;  Laterality: N/A;  . TONSILLECTOMY     and adenoidectomy  . 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/2019   Procedure: TRANSMETATARSAL AMPUTATION;  Surgeon: Gershon Donnice SAUNDERS, DPM;  Location: [facility] OR;  Service: Podiatry;  Laterality: Right;  . TRANSMETATARSAL AMPUTATION Left 05/11/2021   Procedure: LEFT TRANSMETATARSAL AMPUTATION;  Surgeon: Sheree Penne Lonni, MD;  Location: [facility] OR;  Service: Vascular;  Laterality: Left;  . TUBAL LIGATION      OB History   No obstetric history on file.      Home Medications    Prior to Admission medications   Medication Sig Start Date End Date Taking? Authorizing Provider  acetaminophen  (TYLENOL ) 500 MG tablet Take 1,000 mg by mouth every 6 (six) hours as needed for moderate pain (pain score 4-6), fever or mild pain (pain score 1-3).    [provider]  acidophilus (RISAQUAD) CAPS capsule Take 1 capsule by mouth daily.    [provider]  brimonidine  (ALPHAGAN ) 0.2 % ophthalmic solution Place 1 drop into the right eye 2 (two) times daily. 01/19/21   [provider]  doxycycline  (VIBRA -TABS) 100 MG tablet Take 1 tablet (100 mg total) by mouth every 12 (twelve) hours. 04/16/24    Shalhoub, Zachary PARAS, MD  feeding supplement (ENSURE ENLIVE / ENSURE PLUS) LIQD Take 237 mLs by mouth 2 (two) times daily between meals. 02/18/24   Krishnan, Gokul, MD  ferrous sulfate  325 (65 FE) MG EC tablet Take 1 tablet (325 mg total) by mouth daily before breakfast. To be taken with a cup of orange juice on an otherwise empty stomach if possible 04/16/24 04/16/25  Shalhoub, Zachary PARAS, MD  HUMALOG  MIX 75/25 KWIKPEN (75-25) 100 UNIT/ML KwikPen Inject into the skin. 04/20/24   [provider]  JARDIANCE  25 MG TABS tablet Take 25 mg by mouth daily. 04/20/24   [provider]  magnesium  oxide (MAG-OX) 400 (240 Mg) MG tablet Take 400 mg by mouth daily.    [provider]  meropenem  1 g in sodium chloride  0.9 % 100 mL Inject 1 g into the vein every 12 (twelve) hours. 04/16/24   Shalhoub, Zachary PARAS, MD  Metformin  HCl 500 MG/5ML SOLN  04/20/24   [provider]  metoprolol  tartrate (LOPRESSOR ) 25 MG tablet Take 1 tablet (25 mg total) by mouth 2 (two) times daily. 02/18/24   Krishnan, Gokul, MD  mirtazapine  (REMERON ) 15 MG tablet Take 1 tablet (15 mg total) by mouth at bedtime. 04/16/24   Shalhoub, Zachary PARAS, MD  pantoprazole  (PROTONIX ) 20 MG tablet Take 20 mg by mouth daily. 03/25/24   [provider]  polyethylene glycol (MIRALAX  / GLYCOLAX ) 17 g packet Take 17 g by mouth daily. 02/19/24   Krishnan, Gokul, MD  Propylene Glycol (SYSTANE BALANCE) 0.6 % SOLN Place 1 drop into the left eye 3 (three) times daily as needed (dry/irritated eyes).    [provider]  rivaroxaban  (XARELTO ) 10 MG TABS tablet Take 1 tablet (10 mg total) by mouth daily. 04/25/24   Shalhoub, Zachary PARAS, MD  rosuvastatin  (CRESTOR ) 40 MG tablet Take 1 tablet (40 mg total) by mouth daily. 02/06/24 05/06/24  Daneen Damien BROCKS, NP  telmisartan  (MICARDIS ) 40 MG tablet Take 40 mg by mouth daily. 03/27/24   [provider]  traZODone  (DESYREL ) 100 MG tablet Take 100 mg by mouth at bedtime. 03/12/24    [provider]    Family History Family History  Problem Relation Age of Onset  . Cancer - Prostate Father   . Cancer - Colon Father   . Stroke Mother   . Hypertension Mother   . Hyperlipidemia Mother   . Melanoma Brother     Social History Social History   Tobacco Use  . Smoking status: Former    Current packs/day: 0.00    Types: Cigarettes    Quit date: 03/31/1972    Years since quitting: 52.1  . Smokeless tobacco: Never  Vaping Use  . Vaping status: Never Used  Substance Use Topics  . Alcohol  use: No  . Drug use: No     Allergies   Versed  [midazolam ], Bactrim  [sulfamethoxazole -trimethoprim ], Augmentin [amoxicillin-pot clavulanate], Demerol [meperidine], Meperidine hcl, Scopolamine, and Tramadol    Review of Systems Review of Systems   Physical Exam Updated Vital Signs BP (!) 129/49 (BP Location: Left Arm)   Pulse (!) 101   Temp 97.7 F (36.5 C) (Oral)   Resp 18   Ht 5' 5 (1.651 m)   Wt 48.1 kg   SpO2 100%   BMI 17.64 kg/m   Physical Exam   ED Treatments / Results  Labs (all labs ordered are listed, but only abnormal results are displayed) Labs Reviewed  CBC  COMPREHENSIVE METABOLIC PANEL WITH GFR  LIPASE, BLOOD  OCCULT BLOOD X 1 CARD TO LAB, STOOL  TYPE AND SCREEN    EKG  Radiology No results found.  Procedures Procedures (including critical care time)  Medications Ordered in ED Medications - No data to display   Initial Impression / Assessment and Plan / ED Course  I have reviewed the triage vital signs and the nursing notes.  Pertinent labs & imaging results that were available during my care of the patient were reviewed by me and considered in my medical decision making (see chart for details).     ***  Final Clinical Impressions(s) / ED Diagnoses   Final diagnoses:  None    New Prescriptions New Prescriptions   No medications on file

## 2024-04-29 NOTE — ED Provider Notes (Signed)
 [place] [facility] DE[facility]MENT AT [facility] Provider Note   CSN: [id number] Arrival date & time: 04/29/24  0130     Patient presents with: abnormal labs   [name] is a 83 y.o. 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  Patient presents to ED for evaluation.  Patient reports that she was recently admitted to the hospital from 7/22 to 7/24 secondary to anemia.  Reports during this time she was given 2 units packed red blood cells and noted to have anemia most likely due to multifactorial component such as poor oral intake, iron  deficiency anemia, bleeding sacral ulcer.  Patient was seen in office yesterday by infectious disease where she was advised that her wound on her sacrum is healing appropriately.  Lab was drawn at this visit and the patient was noted to be anemic to 6 and sent to ED for evaluation.  The patient was recently admitted from 7/22 to 7/24 for anemia.  She was advised to hold anticoagulation at that time which she takes for PVD.  She reports that she recently started her anticoagulation again on 8/2.  She states that she feels fine.  Denies any abdominal pain, nausea, vomiting, blood in stool, lightheadedness, dizziness, weakness, shortness of breath.  She has endorsing fatigue but states her fatigue has been present since she was discharged on the 24th.  She reports a history of diarrhea but states that she has a longstanding history of diarrhea and there is no change to her diarrhea tonight.  Denies any pain in her rectum.  Denies dysuria.   HPI     Prior to Admission medications   Medication Sig Start Date End Date Taking? Authorizing Provider  acetaminophen  (TYLENOL ) 500 MG tablet Take 1,000 mg by mouth every 6 (six) hours as needed for moderate pain (pain score 4-6), fever or mild pain (pain score 1-3).   Yes [provider]  brimonidine  (ALPHAGAN ) 0.2 %  ophthalmic solution Place 1 drop into the right eye 2 (two) times daily. 01/19/21  Yes [provider]  doxycycline  (VIBRA -TABS) 100 MG tablet Take 1 tablet (100 mg total) by mouth every 12 (twelve) hours. 04/16/24  Yes Shalhoub, Zachary PARAS, MD  ferrous sulfate  325 (65 FE) MG EC tablet Take 1 tablet (325 mg total) by mouth daily before breakfast. To be taken with a cup of orange juice on an otherwise empty stomach if possible 04/16/24 04/16/25 Yes Shalhoub, Zachary PARAS, MD  HUMALOG  MIX 75/25 KWIKPEN (75-25) 100 UNIT/ML KwikPen Inject 6 Units into the skin daily. 04/20/24  Yes [provider]  hydrochlorothiazide  (HYDRODIURIL ) 12.5 MG tablet Take 12.5 mg by mouth daily.   Yes [provider]  JARDIANCE  25 MG TABS tablet Take 25 mg by mouth daily. 04/20/24  Yes [provider]  magnesium  oxide (MAG-OX) 400 (240 Mg) MG tablet Take 400 mg by mouth 2 (two) times daily.   Yes [provider]  meropenem  1 g in sodium chloride  0.9 % 100 mL Inject 1 g into the vein every 12 (twelve) hours. 04/16/24  Yes Shalhoub, Zachary PARAS, MD  Metformin  HCl 500 MG/5ML SOLN Take 10 mLs by mouth in the morning and at bedtime. 04/20/24  Yes [provider]  metoprolol  tartrate (LOPRESSOR ) 25 MG tablet Take 1 tablet (25 mg total) by mouth 2 (two) times daily. 02/18/24  Yes Krishnan, Gokul, MD  mirtazapine  (REMERON ) 7.5 MG tablet Take 15 mg by mouth  at bedtime.   Yes [provider]  pantoprazole  (PROTONIX ) 20 MG tablet Take 20 mg by mouth daily. 03/25/24  Yes [provider]  polyethylene glycol (MIRALAX  / GLYCOLAX ) 17 g packet Take 17 g by mouth daily. Patient taking differently: Take 17 g by mouth daily as needed. 02/19/24  Yes Verdene Purchase, MD  potassium chloride  (MICRO-K ) 10 MEQ CR capsule Take 10 mEq by mouth 2 (two) times daily.   Yes [provider]  Probiotic Product (PROBIOTIC PO) Take 1 capsule by mouth daily.   Yes [provider]  Propylene  Glycol (SYSTANE BALANCE) 0.6 % SOLN Place 1 drop into the left eye 3 (three) times daily as needed (dry/irritated eyes).   Yes [provider]  repaglinide  (PRANDIN ) 1 MG tablet Take 1 mg by mouth 2 (two) times daily before a meal.   Yes [provider]  rivaroxaban  (XARELTO ) 10 MG TABS tablet Take 1 tablet (10 mg total) by mouth daily. 04/25/24  Yes Shalhoub, Zachary PARAS, MD  rosuvastatin  (CRESTOR ) 40 MG tablet Take 1 tablet (40 mg total) by mouth daily. 02/06/24 05/06/24 Yes Monge, Damien BROCKS, NP  telmisartan  (MICARDIS ) 40 MG tablet Take 40 mg by mouth daily. 03/27/24  Yes [provider]  traZODone  (DESYREL ) 100 MG tablet Take 100 mg by mouth at bedtime. 03/12/24  Yes [provider]  feeding supplement (ENSURE ENLIVE / ENSURE PLUS) LIQD Take 237 mLs by mouth 2 (two) times daily between meals. Patient not taking: Reported on 04/29/2024 02/18/24   Krishnan, Gokul, MD    Allergies: Versed  Harri.Guild ], Bactrim  [sulfamethoxazole -trimethoprim ], Augmentin [amoxicillin-pot clavulanate], Demerol [meperidine], Meperidine hcl, Scopolamine, and Tramadol     Review of Systems  Constitutional:  Positive for fatigue.  All other systems reviewed and are negative.   Updated Vital Signs BP (!) 125/50   Pulse 82   Temp (!) 97.4 F (36.3 C)   Resp 16   Ht 5' 5 (1.651 m)   Wt 48.1 kg   SpO2 100%   BMI 17.64 kg/m   Physical Exam Vitals and nursing note reviewed.  Constitutional:      General: She is not in acute distress.    Appearance: She is well-developed.  HENT:     Head: Normocephalic and atraumatic.  Eyes:     Conjunctiva/sclera: Conjunctivae normal.  Cardiovascular:     Rate and Rhythm: Normal rate and regular rhythm.     Heart sounds: No murmur heard. Pulmonary:     Effort: Pulmonary effort is normal. No respiratory distress.     Breath sounds: Normal breath sounds.  Abdominal:     Palpations: Abdomen is soft.     Tenderness: There is no abdominal tenderness.   Musculoskeletal:        General: No swelling.     Cervical back: Neck supple.  Skin:    General: Skin is warm and dry.     Capillary Refill: Capillary refill takes less than 2 seconds.  Neurological:     Mental Status: She is alert and oriented to person, place, and time. Mental status is at baseline.  Psychiatric:        Mood and Affect: Mood normal.     (all labs ordered are listed, but only abnormal results are displayed) Labs Reviewed  CBC - Abnormal; Notable for the following components:      Result Value   RBC 2.01 (*)    Hemoglobin 5.7 (*)    HCT 18.8 (*)    RDW 22.5 (*)  All other components within normal limits  COMPREHENSIVE METABOLIC PANEL WITH GFR - Abnormal; Notable for the following components:   CO2 21 (*)    BUN 32 (*)    Creatinine, Ser 1.08 (*)    Calcium  8.7 (*)    Total Protein 4.4 (*)    Albumin  1.7 (*)    AST 76 (*)    ALT 54 (*)    Alkaline Phosphatase 403 (*)    GFR, Estimated 51 (*)    All other components within normal limits  LIPASE, BLOOD  OCCULT BLOOD X 1 CARD TO LAB, STOOL  POC OCCULT BLOOD, ED  TYPE AND SCREEN  PREPARE RBC (CROSSMATCH)    EKG: None  Radiology: No results found.   .Critical Care  Performed by: Ruthell Lonni FALCON, PA-C Authorized by: Ruthell Lonni FALCON, PA-C   Critical care provider statement:    Critical care time (minutes):  45   Critical care time was exclusive of:  Separately billable procedures and treating other patients   Critical care was necessary to treat or prevent imminent or life-threatening deterioration of the following conditions: anemia.   Critical care was time spent personally by me on the following activities:  Blood draw for specimens, development of treatment plan with patient or surrogate, discussions with consultants, discussions with primary provider, evaluation of patient's response to treatment, examination of patient, interpretation of cardiac output measurements, obtaining  history from patient or surrogate, ordering and performing treatments and interventions, ordering and review of laboratory studies, ordering and review of radiographic studies, pulse oximetry, re-evaluation of patient's condition and review of old charts   I assumed direction of critical care for this patient from another provider in my specialty: no     Care discussed with: admitting provider      Medications Ordered in the ED  0.9 %  sodium chloride  infusion (Manually program via Guardrails IV Fluids) (0 mLs Intravenous Hold 04/29/24 0304)    Medical Decision Making Amount and/or Complexity of Data Reviewed Labs: ordered. ECG/medicine tests: ordered.  Risk Prescription drug management.   This is an 83 year old female who presents to the ED for evaluation of abnormal lab.  Please see HPI for further details.  83 year old advised that she has low hemoglobin on recent laboratory evaluation.  Advised to come to the ED.  On examination she only reports fatigue.  Denies any chest pain, shortness of breath, lightheadedness, dizziness or weakness.  Denies syncope.  Recently admitted at the end of July for anemia.  Thought to be multifactorial due to bleeding sacral ulcer, poor p.o. intake, history of iron  deficiency anemia.  Patient had anticoagulation held at this time, was hospitalized and hemoglobin stabilized.  Patient reports he recently began anticoagulation on the 2 August.  On examination patient is tachycardic, afebrile.  Lung sounds are clear bilaterally, no hypoxia.  Abdomen soft compressible.  Neurological examination at baseline.  Patient CBC here shows hemoglobin 5.7.  No leukocytosis.  Metabolic panel without electrolyte derangement, baseline creatinine, BUN 32, alk phos is elevated.  13 days ago 150, today 403.  Bilirubin 0.4.  Anion gap 10.  Occult card collected at this time, pending.  Patient has received typed and screened and has had 2 units of blood ordered.  It appears the  patient has refused blood products in the past however she has consented for these blood products to be transfused.  Similar issue on last admission.  Anticipate admission, signed out to Dr. Albertina.     Final  diagnoses:  Anemia, unspecified type    ED Discharge Orders     None          Ruthell Lonni FALCON, PA-C 04/29/24 0455    Albertina Dixon, MD 05/05/24 [phone]

## 2024-04-29 NOTE — Consult Note (Signed)
 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  Deep tissue injury surrounding sacral wound and upper right buttock involved.   Skin tear left hand Bilateral transmetatarsal amputations with device related pressure injury to left lateral foot along suture line.   Reason for Consult:wounds above Wound type: pressure, trauma Pressure Injury POA: Yes Measurement: coccyx :  4 cm x 5 cm x 1.5 cm   maroon discoloration to periwound, extends 0.5 cm circumferentially Right upper gluteal with 2 cm x 2 cm intact maroon discoloration, that is slightly boggy compared to surrounding skin Left hand trauma 1 cm x 1.5 cm  scabbed abrasion Bilateral transmetatarsal amputations with nonintact wound to left latera foot along suture line.  1 cm x 1 cm x 0.1 cm  Wound azi:djrmlf:  ruborous and moist  Drainage (amount, consistency, odor) minimal serosanguinous  Periwound: noted deep tissue injury to periwound sacral wound Dressing procedure/placement/frequency: Cleanse sacral wound with NS and pat dry.  Apply aquacel to open wound. COver with sacral foam.  Prompt incontinence care.  Silicone foam to left hand and left foot.   Low air loss mattress Will not follow at this time.  Please re-consult if needed.  Darice Cooley MSN, RN, FNP-BC CWON Wound, Ostomy, Continence Nurse Outpatient [facility] [phone] Pager [phone]

## 2024-04-30 DIAGNOSIS — I2510 Atherosclerotic heart disease of native coronary artery without angina pectoris: Secondary | ICD-10-CM | POA: Diagnosis present

## 2024-04-30 DIAGNOSIS — D6832 Hemorrhagic disorder due to extrinsic circulating anticoagulants: Secondary | ICD-10-CM | POA: Diagnosis present

## 2024-04-30 DIAGNOSIS — M4628 Osteomyelitis of vertebra, sacral and sacrococcygeal region: Secondary | ICD-10-CM | POA: Diagnosis present

## 2024-04-30 DIAGNOSIS — I480 Paroxysmal atrial fibrillation: Secondary | ICD-10-CM | POA: Diagnosis present

## 2024-04-30 DIAGNOSIS — D631 Anemia in chronic kidney disease: Secondary | ICD-10-CM | POA: Diagnosis present

## 2024-04-30 DIAGNOSIS — L89154 Pressure ulcer of sacral region, stage 4: Secondary | ICD-10-CM | POA: Diagnosis present

## 2024-04-30 DIAGNOSIS — D696 Thrombocytopenia, unspecified: Secondary | ICD-10-CM | POA: Diagnosis present

## 2024-04-30 DIAGNOSIS — D649 Anemia, unspecified: Secondary | ICD-10-CM

## 2024-04-30 DIAGNOSIS — C221 Intrahepatic bile duct carcinoma: Secondary | ICD-10-CM | POA: Diagnosis present

## 2024-04-30 DIAGNOSIS — E1151 Type 2 diabetes mellitus with diabetic peripheral angiopathy without gangrene: Secondary | ICD-10-CM | POA: Diagnosis present

## 2024-04-30 DIAGNOSIS — D630 Anemia in neoplastic disease: Secondary | ICD-10-CM | POA: Diagnosis present

## 2024-04-30 DIAGNOSIS — E43 Unspecified severe protein-calorie malnutrition: Secondary | ICD-10-CM | POA: Diagnosis present

## 2024-04-30 DIAGNOSIS — K219 Gastro-esophageal reflux disease without esophagitis: Secondary | ICD-10-CM | POA: Diagnosis present

## 2024-04-30 DIAGNOSIS — D62 Acute posthemorrhagic anemia: Secondary | ICD-10-CM | POA: Diagnosis present

## 2024-04-30 DIAGNOSIS — Z681 Body mass index (BMI) 19 or less, adult: Secondary | ICD-10-CM | POA: Diagnosis not present

## 2024-04-30 DIAGNOSIS — E1142 Type 2 diabetes mellitus with diabetic polyneuropathy: Secondary | ICD-10-CM | POA: Diagnosis present

## 2024-04-30 DIAGNOSIS — I252 Old myocardial infarction: Secondary | ICD-10-CM | POA: Diagnosis not present

## 2024-04-30 DIAGNOSIS — E875 Hyperkalemia: Secondary | ICD-10-CM | POA: Diagnosis present

## 2024-04-30 DIAGNOSIS — I129 Hypertensive chronic kidney disease with stage 1 through stage 4 chronic kidney disease, or unspecified chronic kidney disease: Secondary | ICD-10-CM | POA: Diagnosis present

## 2024-04-30 DIAGNOSIS — N1831 Chronic kidney disease, stage 3a: Secondary | ICD-10-CM | POA: Diagnosis present

## 2024-04-30 DIAGNOSIS — E785 Hyperlipidemia, unspecified: Secondary | ICD-10-CM | POA: Diagnosis present

## 2024-04-30 DIAGNOSIS — E113493 Type 2 diabetes mellitus with severe nonproliferative diabetic retinopathy without macular edema, bilateral: Secondary | ICD-10-CM | POA: Diagnosis present

## 2024-04-30 DIAGNOSIS — E11649 Type 2 diabetes mellitus with hypoglycemia without coma: Secondary | ICD-10-CM | POA: Diagnosis present

## 2024-04-30 DIAGNOSIS — E1122 Type 2 diabetes mellitus with diabetic chronic kidney disease: Secondary | ICD-10-CM | POA: Diagnosis present

## 2024-04-30 DIAGNOSIS — E1169 Type 2 diabetes mellitus with other specified complication: Secondary | ICD-10-CM | POA: Diagnosis present

## 2024-04-30 LAB — GLUCOSE, CAPILLARY
Glucose-Capillary: 138 mg/dL — ABNORMAL HIGH (ref 70–99)
Glucose-Capillary: 28 mg/dL — CL (ref 70–99)
Glucose-Capillary: 61 mg/dL — ABNORMAL LOW (ref 70–99)
Glucose-Capillary: 67 mg/dL — ABNORMAL LOW (ref 70–99)
Glucose-Capillary: 90 mg/dL (ref 70–99)
Glucose-Capillary: 176 mg/dL — ABNORMAL HIGH (ref 70–99)
Glucose-Capillary: 186 mg/dL — ABNORMAL HIGH (ref 70–99)
Glucose-Capillary: 157 mg/dL — ABNORMAL HIGH (ref 70–99)

## 2024-04-30 LAB — COMPREHENSIVE METABOLIC PANEL WITH GFR
ALT: 45 U/L — ABNORMAL HIGH (ref 0–44)
AST: 69 U/L — ABNORMAL HIGH (ref 15–41)
Albumin: <1.5 g/dL — ABNORMAL LOW (ref 3.5–5.0)
Alkaline Phosphatase: 333 U/L — ABNORMAL HIGH (ref 38–126)
Anion gap: 5 (ref 5–15)
BUN: 28 mg/dL — ABNORMAL HIGH (ref 8–23)
CO2: 21 mmol/L — ABNORMAL LOW (ref 22–32)
Calcium: 8.1 mg/dL — ABNORMAL LOW (ref 8.9–10.3)
Chloride: 109 mmol/L (ref 98–111)
Creatinine, Ser: 1.17 mg/dL — ABNORMAL HIGH (ref 0.44–1.00)
GFR, Estimated: 47 mL/min — ABNORMAL LOW (ref >60)
Glucose, Bld: 36 mg/dL — CL (ref 70–99)
Potassium: 5.2 mmol/L — ABNORMAL HIGH (ref 3.5–5.1)
Sodium: 135 mmol/L (ref 135–145)
Total Bilirubin: 0.5 mg/dL (ref 0.0–1.2)
Total Protein: 3.6 g/dL — ABNORMAL LOW (ref 6.5–8.1)

## 2024-04-30 LAB — CBC
HCT: 23.4 % — ABNORMAL LOW (ref 36.0–46.0)
Hemoglobin: 7.5 g/dL — ABNORMAL LOW (ref 12.0–15.0)
MCH: 29.6 pg (ref 26.0–34.0)
MCHC: 32.1 g/dL (ref 30.0–36.0)
MCV: 92.5 fL (ref 80.0–100.0)
Platelets: 138 K/uL — ABNORMAL LOW (ref 150–400)
RBC: 2.53 MIL/uL — ABNORMAL LOW (ref 3.87–5.11)
RDW: 19.5 % — ABNORMAL HIGH (ref 11.5–15.5)
WBC: 10.7 K/uL — ABNORMAL HIGH (ref 4.0–10.5)
nRBC: 0.2 % (ref 0.0–0.2)

## 2024-04-30 MED ORDER — BOOST PLUS PO LIQD
237.0000 mL | Freq: Three times a day (TID) | ORAL | Status: DC
  Administered 2024-04-30 – 2024-05-01 (×4): 237 mL via ORAL
  Filled 2024-04-30 (×5): qty 237

## 2024-04-30 MED ORDER — HYDROCHLOROTHIAZIDE 12.5 MG PO TABS
12.5000 mg | ORAL_TABLET | Freq: Every day | ORAL | Status: DC
  Administered 2024-04-30 – 2024-05-01 (×2): 12.5 mg via ORAL
  Filled 2024-04-30 (×2): qty 1

## 2024-04-30 MED ORDER — DEXTROSE 50 % IV SOLN
INTRAVENOUS | Status: AC
  Administered 2024-04-30: 50 mL via INTRAVENOUS
  Filled 2024-04-30: qty 50

## 2024-04-30 MED ORDER — SODIUM ZIRCONIUM CYCLOSILICATE 5 G PO PACK
5.0000 g | PACK | Freq: Once | ORAL | Status: AC
  Administered 2024-04-30: 5 g via ORAL
  Filled 2024-04-30: qty 1

## 2024-04-30 MED ORDER — ZINC SULFATE 220 (50 ZN) MG PO CAPS
220.0000 mg | ORAL_CAPSULE | Freq: Every day | ORAL | Status: DC
  Administered 2024-05-01: 220 mg via ORAL
  Filled 2024-04-30: qty 1

## 2024-04-30 MED ORDER — DEXTROSE 50 % IV SOLN: 25.0000 g | INTRAVENOUS | Status: AC

## 2024-04-30 MED ORDER — B COMPLEX-C PO TABS
1.0000 | ORAL_TABLET | Freq: Every day | ORAL | Status: DC
  Administered 2024-05-01: 1 via ORAL
  Filled 2024-04-30: qty 1

## 2024-04-30 NOTE — Care Management Obs Status (Signed)
 MEDICARE OBSERVATION STATUS NOTIFICATION   Patient Details  Name: [name] MRN: [id number] Date of Birth: [birth year]   Medicare Observation Status Notification Given:  Yes    Bascom Service, RN 04/30/2024, 12:15 PM

## 2024-04-30 NOTE — Progress Notes (Signed)
 Initial Nutrition Assessment  DOCUMENTATION CODES:   Severe malnutrition in context of chronic illness  INTERVENTION:  - Soft diet per MD.  - Boost Plus po TID, each supplement provides 360 kcal and 14 grams of protein - Encourage intake of frequent meals and snacks throughout the day in addition to supplements. - Will add B-complex with vitamin C and 220mg  zinc  x14 days to support wound healing.   - Once patient restarts cancer treatment, recommend discontinuing vitamin/mineral supplementation. - Monitor weight trends.   NUTRITION DIAGNOSIS:   Severe Malnutrition related to chronic illness (Cholangiocarcinoma) as evidenced by moderate fat depletion, severe muscle depletion, percent weight loss (27% in 6 months).  GOAL:   Patient will meet greater than or equal to 90% of their needs  MONITOR:   PO intake, Supplement acceptance, Diet advancement, Weight trends  REASON FOR ASSESSMENT:   Consult Assessment of nutrition requirement/status  ASSESSMENT:   83 y.o. 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   Patient reports a UBW of 140# she last weighed around May and steady weight loss since that time from poor nutrition during cancer treatment.  Per EMR, patient weighed at 145# in February and has since continued to drop to current weight of 106#. This is a 39# or 27% weight loss in 6 months, which is significant and severe for the time frame.   Patient endorses typically consuming 3 meals a day at home. Over the past 3 months, she has continued to consume 3 meals a day in addition to snacks but admits the portions have been smaller. Has also  been consuming Premier Protein twice daily. Despite eating fairly well, she has continued to lose weight.  Main issues with eating well is her poor appetite and decreased taste secondary to radiation.  Thankfully, he reports her current appetite is much better than it was PTA. Was on clear liquids yesterday but advanced to soft diet today. Consuming 100% of all meals. She does not like traditional Ensure but agreeable to try Boost Plus to support intake during admission.     Medications reviewed and include: Remeron   Labs reviewed:  K+ 5.2 Creatinine 1.17 HA1C 4.9 (as of 5/22) Blood Glucose 28-138 x24 hours  NUTRITION - FOCUSED PHYSICAL EXAM:  Flowsheet Row Most Recent Value  Orbital Region Moderate depletion  Upper Arm Region Mild depletion  Thoracic and Lumbar Region Unable to assess  Buccal Region Moderate depletion  Temple Region Mild depletion  Clavicle Bone Region Moderate depletion  Clavicle and Acromion Bone Region Moderate depletion  Scapular Bone Region Unable to assess  Dorsal Hand Moderate depletion  Patellar Region Severe depletion  Anterior Thigh Region Severe depletion  Posterior Calf Region Severe depletion  Edema (RD Assessment) None  Hair Reviewed  Eyes Reviewed  Mouth Reviewed  Skin Reviewed  Nails Reviewed    Diet Order:   Diet Order             DIET SOFT Room service appropriate? Yes; Fluid consistency: Thin  Diet effective now                   EDUCATION NEEDS:  Education needs have been addressed  Skin:  Skin Assessment: Skin Integrity Issues: Skin Integrity Issues:: Stage  IV, Diabetic Ulcer Stage IV: Right Coccyx; Medial Sacrum Diabetic Ulcer: Right Foot  Last BM:  8/6 - type 6  Height:  Ht Readings from Last 1 Encounters:  04/29/24 5' 5 (1.651 m)   Weight:  Wt Readings from Last 1 Encounters:  04/29/24 48.1 kg   BMI:  Body mass index is 17.64 kg/m.  Estimated Nutritional Needs:  Kcal:  1700-1900 kcals Protein:  75-85  grams Fluid:  >/= 1.7L    Trude Ned RD, LDN Contact via Secure Chat.

## 2024-04-30 NOTE — Progress Notes (Signed)
 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  She recently started taking Xarelto  for her paroxysmal A-fib. On presentation, she was hemodynamically stable.  Lab work showed hemoglobin in the range of 5.  Ordered 2 units of blood transfusion.  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  Recently started on Xarelto .  Continue to hold.  She was seen by GI on previous admission and they did not recommend any endoscopic workup.  Currently on pantoprazole  which will be continued.  I discussed with [facility], Dr. Burnette about this, he continued to emphasize with no plan for endoscopic procedures.  Continue to monitor hemoglobin, transfuse as needed.  Paroxysmal A-fib: Currently in normal sinus rhythm.  Continue to hold Xarelto  for now.  She can discuss with primary care physician as an outpatient  and needs to closely monitor her hemoglobin before restarting.  Continue metoprolol   Pressure ulcer of the sacrum: She has history of sacral osteomyelitis and is on meropenem  and doxycycline  as per ID.  Continue those antibiotics.  Wound care already consulted.  Plan to continue antibiotics through 8/15.  Hypertension: Currently blood pressure stable.  Continue metoprolol , hydrochlorothiazide   Hyperlipidemia: On Crestor   Controlled diabetes type 2: On oral medications at home.  Hypoglycemic this morning.  Continue to hold oral medication for now.  Continue sliding scale as needed.  Diabetic coordinator following.  She has severe nonproliferative retinopathy of both eyes  History of coronary disease/carotid artery disease: Currently on statin  CKD stage IIIa/hyperkalemia: Currently kidney function at baseline.  Potassium 5.2 today.  Given a dose of Lokelma   Cholangiocarcinoma: We recommend to follow-up with oncology as an outpatient.  Has chronically mildly  elevated liver enzymes  Severe protein calorie malnutrition: Albumin  of less than 1.5.  Will consult nutritionist  Debility/deconditioning: Patient currently residing at a skilled facility.  TOC consulted         DVT prophylaxis:SCDs Start: 04/29/24 0809     Code Status: Full Code  Family Communication: None at the bedside, patient says no need to call anybody  Patient status:Inpatient  Patient is from :SNF  Anticipated discharge to:SNF  Estimated DC date:2-3 days   Consultants: None  Procedures: None  Antimicrobials:  Anti-infectives (From admission, onward)    Start     Dose/Rate Route Frequency Ordered Stop   04/29/24 1200  doxycycline  (VIBRA -TABS) tablet 100 mg        100 mg Oral Every 12 hours 04/29/24 1108 05/09/24 0959   04/29/24 1200  meropenem  (MERREM ) 1 g in sodium chloride   0.9 % 100 mL IVPB        1 g 200 mL/hr over 30 Minutes Intravenous Every 12 hours 04/29/24 1110 05/08/24 2359        Subjective: Patient seen and examined at bedside today.  Hemodynamically stable.  She was very comfortable this morning.  Eating her food.  Denies abdominal pain, nausea or vomiting.  On room air.  She says her stools are chronically black because she takes iron .  She says she also has hemorrhoids.  She says she wants to [facility] back to [facility].  We discussed about monitoring her hemoglobin and staying for at least 1-2 more days  Objective: Vitals:   04/29/24 1647 04/29/24 1835 04/29/24 2035 04/30/24 0436  BP: (!) 114/42 (!) 121/40 (!) 123/46 (!) 111/44  Pulse: 64  78 75  Resp: 16  18 16   Temp: 97.8 F (36.6 C)  98.4 F (36.9 C) 97.6 F (36.4 C)  TempSrc: Oral  Oral Oral  SpO2: 100%  100% 97%  Weight:      Height:        Intake/Output Summary (Last 24 hours) at 04/30/2024 0745 Last data filed at 04/29/2024 1800 Gross per 24 hour  Intake 810.12 ml  Output --  Net 810.12 ml   Filed Weights   04/29/24 0137  Weight: 48.1 kg    Examination:  General exam: Overall comfortable, not in distress, pleasant elderly female HEENT: PERRL Respiratory system:  no wheezes or crackles  Cardiovascular system: S1 & S2 heard, RRR.  Gastrointestinal system: Abdomen is nondistended, soft and nontender. Central nervous system: Alert and oriented Extremities: No edema, no clubbing ,no cyanosis, bilateral transmetatarsal amputation Skin: No rashes, no ulcers,no icterus     Data Reviewed: I have personally reviewed following labs and imaging studies  CBC: Recent Labs  Lab 04/28/24 1326 04/29/24 0219 04/29/24 1112 04/30/24 0312  WBC 10.1 10.5  --  10.7*  HGB 6.0* 5.7* 9.0* 7.5*  HCT 20.1* 18.8* 27.3* 23.4*  MCV 95.3 93.5  --  92.5  PLT 181 176  --  138*   Basic Metabolic Panel: Recent Labs  Lab 04/28/24 1326 04/29/24 0219 04/30/24 0312  NA 135 138 135  K 5.6* 5.0 5.2*  CL 109 107 109  CO2 24 21* 21*  GLUCOSE 117* 88 36*  BUN 33* 32* 28*  CREATININE 1.06* 1.08* 1.17*   CALCIUM  8.5* 8.7* 8.1*     Recent Results (from the past 240 hours)  MRSA Next Gen by PCR, Nasal     Status: None   Collection Time: 04/29/24  9:50 AM   Specimen: Nasal Mucosa; Nasal Swab  Result Value Ref Range Status   MRSA by PCR Next Gen NOT DETECTED NOT DETECTED Final    Comment: (NOTE) The GeneXpert MRSA Assay (FDA approved for NASAL specimens only), is one component of a comprehensive MRSA colonization surveillance program. It is not intended to diagnose MRSA infection nor to guide or monitor treatment for MRSA infections. Test performance is not FDA approved in patients less than 11 years old. Performed at [facility], [address] [place], [place] [address]      Radiology Studies: DG CHEST PORT 1 VIEW Result Date: 04/29/2024 CLINICAL DATA:  PICC placement. EXAM: PORTABLE CHEST 1 VIEW COMPARISON:  Feb 12, 2024. FINDINGS: Stable cardiomediastinal silhouette. Status post coronary artery bypass graft. Left subclavian Port-A-Cath is noted and unchanged. Interval placement of right-sided PICC line with distal tip in expected position of cavoatrial  junction. No pneumothorax is noted. Lungs are clear. Degenerative changes are seen involving both shoulders. IMPRESSION: Interval placement of right-sided PICC line with distal tip in expected position of cavoatrial junction. Electronically Signed   By: Lynwood Landy Raddle M.D.   On: 04/29/2024 12:51    Scheduled Meds:  sodium chloride    Intravenous Once   brimonidine   1 drop Right Eye BID   Chlorhexidine  Gluconate Cloth  6 each Topical Daily   doxycycline   100 mg Oral Q12H   hydrochlorothiazide   12.5 mg Oral Daily   magnesium  oxide  400 mg Oral BID   metoprolol  tartrate  25 mg Oral BID   mirtazapine   15 mg Oral QHS   pantoprazole  (PROTONIX ) IV  40 mg Intravenous Q12H   rosuvastatin   40 mg Oral Daily   sodium chloride  flush  10-40 mL Intracatheter Q12H   sodium zirconium cyclosilicate   5 g Oral Once   traZODone    100 mg Oral QHS   Continuous Infusions:  meropenem  (MERREM ) IV 1 g (04/29/24 2240)     LOS: 0 days   Ivonne Mustache, MD [facility] P8/03/2024, 7:45 AM

## 2024-04-30 NOTE — TOC Initial Note (Signed)
 Transition of Care [facility]) - Initial/Assessment Note    Patient Details  Name: [name] MRN: [id number] Date of Birth: 04-16-1941  Transition [facility]) CM/SW Contact:    Bascom Service, RN Phone Number: 04/30/2024, 10:25 AM  Clinical Narrative: Spoke to patient about d/c plans-Return back to [facility] rep [place] aware-on long term iv abx-has picc, sacral coccyx wound-WOC following. PT eval await recc.Wears readers, continent B/B, asst needed w/transfers from bed chair/min asst ADL's.                   Expected Discharge Plan: Skilled Nursing Facility Barriers to Discharge: Continued Medical Work up   Patient Goals and CMS Choice Patient states their goals for this hospitalization and ongoing recovery are:: Return back to [facility] CMS Medicare.gov Compare Post Acute Care list provided to:: Patient Choice offered to / list presented to : Patient [place] ownership interest in [facility]provided to:: Patient    Expected Discharge Plan and Services   Discharge Planning Services: CM Consult Post Acute Care Choice: Skilled Nursing Facility Living arrangements for the past 2 months: Skilled Nursing Facility                                      Prior Living Arrangements/Services Living arrangements for the past 2 months: Skilled Nursing Facility Lives with:: Facility Resident                   Activities of Daily Living   ADL Screening (condition at time of admission) Independently performs ADLs?: Yes (appropriate for developmental age) Is the patient deaf or have difficulty hearing?: No Does the patient have difficulty seeing, even when wearing glasses/contacts?: No Does the patient have difficulty concentrating, remembering, or making decisions?: No  Permission Sought/Granted                  Emotional Assessment              Admission diagnosis:  Symptomatic anemia [D64.9] Anemia,  unspecified type [D64.9] Patient Active Problem List   Diagnosis Date Noted   Pressure injury of skin 04/29/2024   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) 04/29/2024   Medication management 04/28/2024   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   Anemia 04/15/2024   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   Pressure injury of sacral region, stage 4 (HCC) 04/15/2024   Osteomyelitis of sacrum (HCC) 04/15/2024   Acute diarrhea 04/15/2024   Acute on chronic anemia 04/14/2024   Paroxysmal atrial fibrillation (HCC) 04/14/2024   Chronic kidney disease, stage 3a (HCC) 04/14/2024   Hyperlipidemia associated with type 2 diabetes mellitus (HCC) 04/14/2024   Severe sepsis (HCC) 02/12/2024   Pneumonia 02/12/2024   Transaminitis 02/12/2024   Atrial fibrillation with RVR (HCC) 02/12/2024   Metabolic acidosis 02/12/2024   Port-A-Cath in place 05/02/2023   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   Coronary artery disease involving native heart without angina pectoris 04/05/2023   Jaundice 04/04/2023   Cholelithiasis 10/12/2022   Symptomatic cholelithiasis 10/12/2022   Family history of malignant neoplasm of digestive organ 02/17/2022   Gangrenous disorder (HCC) 02/17/2022   Gastro-esophageal reflux disease without esophagitis 02/17/2022   Hyperglycemia due to type 2 diabetes mellitus (HCC) 02/17/2022   Iron  deficiency anemia 02/17/2022   Leukocytosis 02/17/2022   Moderate nonproliferative diabetic retinopathy of both eyes without macular edema associated  with type 2 diabetes mellitus (HCC) 02/17/2022   Other acute osteomyelitis, left ankle and foot (HCC) 02/17/2022   Pure hypercholesterolemia 02/17/2022   Skin ulcer of sacrum, limited to breakdown of skin (HCC) 02/17/2022   Thrombocytopenic disorder (HCC) 02/17/2022   Tinnitus 02/17/2022   S/P angiogram of extremity 10/13/2021   Osteomyelitis of foot, left, acute (HCC) 04/27/2021   Cellulitis in diabetic foot (HCC) 04/27/2021   AKI  (acute kidney injury) (HCC) 04/27/2021   Hyponatremia 04/27/2021   Symptomatic anemia 04/27/2021   Cellulitis of left lower extremity    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   Critical lower limb ischemia (HCC) 08/03/2019   Encounter for immunization 07/09/2016   Essential hypertension 03/31/2013   Dyslipidemia 03/31/2013   Type 2 diabetes mellitus with stage 3a chronic kidney disease, without long-term current use of insulin  (HCC) 03/31/2013   Overweight (BMI 25.0-29.9) 03/31/2013   Carotid artery disease (HCC) 03/31/2013   PAD (peripheral artery disease) (HCC) 03/31/2013   PCP:  Chrystal Lamarr RAMAN, MD Pharmacy:   [facility] [address] [place] - [address] [place] [address] Phone: [phone] Fax: [phone]  [facility] [address] [place] [address] Phone: [phone] Fax: [phone]  [facility] [place], [place] - [address] [place] [address] [place] [address] [place] [address] Phone: [phone] Fax: [phone]     Social Drivers of Health (SDOH) Social History: SDOH Screenings   Food Insecurity: No Food Insecurity (04/29/2024)  Housing: Patient Declined (04/29/2024)  Transportation Needs: No Transportation Needs (04/29/2024)  Utilities: Not At Risk (04/29/2024)  Depression (PHQ2-9): Low Risk  (11/27/2021)  Social Connections: Moderately  Isolated (04/29/2024)  Tobacco Use: Medium Risk (04/29/2024)   SDOH Interventions:     Readmission Risk Interventions    04/16/2024    3:13 PM  Readmission Risk Prevention Plan  Transportation Screening Complete  PCP or Specialist Appt within 3-5 Days Complete  HRI or Home Care Consult Complete  Social Work Consult for Recovery Care Planning/Counseling Complete  Medication Review Oceanographer) Referral to Pharmacy

## 2024-04-30 NOTE — Progress Notes (Addendum)
 [name]   DOB:03/21/1941   FM#:[id number]      ASSESSMENT & PLAN:  [name] is an 83 year old female patient with oncologic history significant for cholangiocarcinoma.  Patient admitted 04/29/2024 due to abnormal labs including very low hemoglobin.  Symptomatic anemia - Patient admitted with hemoglobin 5.7.  Status post PRBC transfusions.  Current hemoglobin improved to 7.5 today. - Recommend PRBC transfusion for Hgb <7.0 - Previously seen by GI, no plans for endoscopic procedures. - Continue to monitor CBC with differential  Cholangiocarcinoma, cT4N0M0, stage III  - Initially diagnosed 04/09/2023 with moderately differentiated adenocarcinoma of liver lesion - Status post attempt at laparoscopic cholecystectomy but not able to resect due to adhesions - Initiated cisplatin , gemcitabine  every 14 days and Durvalumab  every 28 days.  Patient tolerated therapy well and chemo was changed to maintenance durvalumab  on 11/22/2023. - Started RT and Xeloda  12/09/2023, completed 01/15/2024.  - Patient seen in outpatient oncology office 03/20/2024.  At that time, planned to restart chemotherapy regimen.  Plan was to continue durvalumab . - Medical oncology/Dr. Lanny following closely  Hypoglycemia/diabetes - Patient noted with low blood glucose level 36 today - Has been on oral meds, on hold at this time - Monitor blood glucose levels closely - Medicine managing  History of A-fib - Has been on Xarelto  which is on hold at this time  Thrombocytopenia - Mild - Platelets 138K - No intervention required at this time - Continue to monitor CBC with differential  Transaminitis CKD - Likely secondary to malignancy - Avoid hepatotoxic and nephrotoxic agents - Continue to monitor CMP   Code Status Full  Subjective:  Patient seen awake and alert laying flat in bed.  Reports that she feels a little better than when she did yesterday.  Denies chest pain or other acute pain.  States she only came  to the hospital because of low blood count which was drawn at physicians office.  Resides at [facility].  Objective:   Intake/Output Summary (Last 24 hours) at 04/30/2024 1106 Last data filed at 04/30/2024 0900 Gross per 24 hour  Intake 1040.12 ml  Output --  Net 1040.12 ml     PHYSICAL EXAMINATION: ECOG PERFORMANCE STATUS: 3 - Symptomatic, >50% confined to bed  Vitals:   04/29/24 2035 04/30/24 0436  BP: (!) 123/46 (!) 111/44  Pulse: 78 75  Resp: 18 16  Temp: 98.4 F (36.9 C) 97.6 F (36.4 C)  SpO2: 100% 97%   Filed Weights   04/29/24 0137  Weight: 106 lb (48.1 kg)    GENERAL: alert, no distress and comfortable SKIN: + Pale skin color, + status post bilateral toes amputation with dressings intact EYES: normal, conjunctiva are pink and non-injected, sclera clear OROPHARYNX: no exudate, no erythema and lips, buccal mucosa, and tongue normal  NECK: supple, thyroid  normal size, non-tender, without nodularity LYMPH: no palpable lymphadenopathy in the cervical, axillary or inguinal LUNGS: clear to auscultation and percussion with normal breathing effort HEART: regular rate & rhythm and no murmurs and no lower extremity edema ABDOMEN: abdomen soft, non-tender and normal bowel sounds MUSCULOSKELETAL: no cyanosis of digits and no clubbing  PSYCH: alert & oriented x 3 with fluent speech NEURO: no focal motor/sensory deficits   All questions were answered. The patient knows to call the clinic with any problems, questions or concerns.   The total time spent in the appointment was 40 minutes encounter with patient including review of chart and various tests results, discussions about plan of care and  coordination of care plan  Olam JINNY Brunner, NP 04/30/2024 11:06 AM    Labs Reviewed:  Lab Results  Component Value Date   WBC 10.7 (H) 04/30/2024   HGB 7.5 (L) 04/30/2024   HCT 23.4 (L) 04/30/2024   MCV 92.5 04/30/2024   PLT 138 (L) 04/30/2024   Recent Labs    04/16/24 0525  04/28/24 1326 04/29/24 0219 04/30/24 0312  NA 134* 135 138 135  K 4.2 5.6* 5.0 5.2*  CL 105 109 107 109  CO2 23 24 21* 21*  GLUCOSE 89 117* 88 36*  BUN 18 33* 32* 28*  CREATININE 1.03* 1.06* 1.08* 1.17*  CALCIUM  8.4* 8.5* 8.7* 8.1*  GFRNONAA 54*  --  51* 47*  PROT 4.6* 4.1* 4.4* 3.6*  ALBUMIN  1.8*  --  1.7* <1.5*  AST 51* 78* 76* 69*  ALT 28 59* 54* 45*  ALKPHOS 150*  --  403* 333*  BILITOT 0.7 0.4 0.4 0.5    Studies Reviewed:  DG CHEST PORT 1 VIEW Result Date: 04/29/2024 CLINICAL DATA:  PICC placement. EXAM: PORTABLE CHEST 1 VIEW COMPARISON:  Feb 12, 2024. FINDINGS: Stable cardiomediastinal silhouette. Status post coronary artery bypass graft. Left subclavian Port-A-Cath is noted and unchanged. Interval placement of right-sided PICC line with distal tip in expected position of cavoatrial junction. No pneumothorax is noted. Lungs are clear. Degenerative changes are seen involving both shoulders. IMPRESSION: Interval placement of right-sided PICC line with distal tip in expected position of cavoatrial junction. Electronically Signed   By: Lynwood Landy Raddle M.D.   On: 04/29/2024 12:51   Addendum I have seen the patient, examined her. I agree with the assessment and and plan and have edited the notes.   Pt is still in rehab, and was admitted for anemia. History of IDA, probably related to anticoagulation, will order iron  study to see if she needs iv iron . She has a PICC line for antibiotics for osteomyelitis. Her cancer has been held due to multiple hospital admissions. I will reschedule her appointment. We will f/u as needed.  Onita Mattock  04/30/2024

## 2024-04-30 NOTE — Inpatient Diabetes Management (Addendum)
 Inpatient Diabetes Program Recommendations  AACE/ADA: New Consensus Statement on Inpatient Glycemic Control (2015)  Target Ranges:  Prepandial:   less than 140 mg/dL      Peak postprandial:   less than 180 mg/dL (1-2 hours)      Critically ill patients:  140 - 180 mg/dL    Latest Reference Range & Units 02/13/24 06:08  Hemoglobin A1C 4.8 - 5.6 % 4.9    Latest Reference Range & Units 04/29/24 16:02 04/30/24 04:32 04/30/24 04:53  Glucose-Capillary 70 - 99 mg/dL 869 (H) 28 (LL)  50 ml D50% 138 (H)  (LL): Data is critically low (H): Data is abnormally high   Admit from [facility] with Symptomatic anemia   History: DM, CKD  Home DM Meds: Humalog  75/25 Insulin  6 units daily       Jardiance  25 mg daily       Metformin  1000 mg BID       Prandin  1 mg BID  Current Orders: Metformin  1000 mg BID      Prandin  1 mg BID      Jardiance  25 mg daily    MD- Note Severe Hypoglycemia this AM.  Pt received Metformin , Prandin , and Jardiance  yesterday  Please consider d/c of the oral DM meds for now  Check CBGs TID AC + HS  If CBGs become elevated, add Novolog  0-6 units TID (very sensitive scale SSI)     --Will follow patient during hospitalization--  Adina Rudolpho Arrow RN, MSN, CDCES Diabetes Coordinator Inpatient Glycemic Control Team Team Pager: [phone] (8a-5p)

## 2024-05-01 DIAGNOSIS — D649 Anemia, unspecified: Secondary | ICD-10-CM | POA: Diagnosis not present

## 2024-05-01 LAB — BASIC METABOLIC PANEL WITH GFR
Anion gap: 6 (ref 5–15)
BUN: 30 mg/dL — ABNORMAL HIGH (ref 8–23)
CO2: 21 mmol/L — ABNORMAL LOW (ref 22–32)
Calcium: 8 mg/dL — ABNORMAL LOW (ref 8.9–10.3)
Chloride: 108 mmol/L (ref 98–111)
Creatinine, Ser: 1.35 mg/dL — ABNORMAL HIGH (ref 0.44–1.00)
GFR, Estimated: 39 mL/min — ABNORMAL LOW (ref >60)
Glucose, Bld: 164 mg/dL — ABNORMAL HIGH (ref 70–99)
Potassium: 4.7 mmol/L (ref 3.5–5.1)
Sodium: 135 mmol/L (ref 135–145)

## 2024-05-01 LAB — IRON AND TIBC
Iron: 25 ug/dL — ABNORMAL LOW (ref 28–170)
Saturation Ratios: 13 % (ref 10.4–31.8)
TIBC: 193 ug/dL — ABNORMAL LOW (ref 250–450)
UIBC: 168 ug/dL

## 2024-05-01 LAB — CBC
HCT: 23.7 % — ABNORMAL LOW (ref 36.0–46.0)
Hemoglobin: 7.4 g/dL — ABNORMAL LOW (ref 12.0–15.0)
MCH: 29.4 pg (ref 26.0–34.0)
MCHC: 31.2 g/dL (ref 30.0–36.0)
MCV: 94 fL (ref 80.0–100.0)
Platelets: 123 K/uL — ABNORMAL LOW (ref 150–400)
RBC: 2.52 MIL/uL — ABNORMAL LOW (ref 3.87–5.11)
RDW: 19.7 % — ABNORMAL HIGH (ref 11.5–15.5)
WBC: 8.1 K/uL (ref 4.0–10.5)
nRBC: 0 % (ref 0.0–0.2)

## 2024-05-01 LAB — GLUCOSE, CAPILLARY
Glucose-Capillary: 116 mg/dL — ABNORMAL HIGH (ref 70–99)
Glucose-Capillary: 130 mg/dL — ABNORMAL HIGH (ref 70–99)
Glucose-Capillary: 173 mg/dL — ABNORMAL HIGH (ref 70–99)
Glucose-Capillary: 184 mg/dL — ABNORMAL HIGH (ref 70–99)
Glucose-Capillary: 214 mg/dL — ABNORMAL HIGH (ref 70–99)

## 2024-05-01 LAB — PREPARE RBC (CROSSMATCH)

## 2024-05-01 LAB — RETICULOCYTES
Immature Retic Fract: 34.5 % — ABNORMAL HIGH (ref 2.3–15.9)
RBC.: 2.49 MIL/uL — ABNORMAL LOW (ref 3.87–5.11)
Retic Count, Absolute: 86.4 K/uL (ref 19.0–186.0)
Retic Ct Pct: 3.5 % — ABNORMAL HIGH (ref 0.4–3.1)

## 2024-05-01 LAB — FERRITIN: Ferritin: 93 ng/mL (ref 11–307)

## 2024-05-01 MED ORDER — HEPARIN SOD (PORK) LOCK FLUSH 100 UNIT/ML IV SOLN
250.0000 [IU] | INTRAVENOUS | Status: AC | PRN
  Administered 2024-05-01 (×2): 250 [IU]

## 2024-05-01 MED ORDER — SODIUM CHLORIDE 0.9% IV SOLUTION: Freq: Once | INTRAVENOUS | Status: AC

## 2024-05-01 MED ORDER — PANTOPRAZOLE SODIUM 40 MG PO TBEC: 40.0000 mg | DELAYED_RELEASE_TABLET | Freq: Two times a day (BID) | ORAL | Status: DC

## 2024-05-01 MED ORDER — HEPARIN SOD (PORK) LOCK FLUSH 100 UNIT/ML IV SOLN
500.0000 [IU] | INTRAVENOUS | Status: AC | PRN
  Administered 2024-05-01: 500 [IU]

## 2024-05-01 MED ORDER — IRON SUCROSE 200 MG IVPB - SIMPLE MED
200.0000 mg | Freq: Once | Status: AC
  Administered 2024-05-01: 200 mg via INTRAVENOUS
  Filled 2024-05-01: qty 110
  Filled 2024-05-01: qty 200

## 2024-05-01 MED ORDER — ZINC SULFATE 220 (50 ZN) MG PO CAPS: 220.0000 mg | ORAL_CAPSULE | Freq: Every day | ORAL | Status: AC

## 2024-05-01 NOTE — Evaluation (Addendum)
 Physical Therapy Evaluation Patient Details Name: [name] MRN: [id number] DOB: [birth year] Today's Date: 05/01/2024  History of Present Illness  83 year old female who was sent from nursing facility for further evaluation of symptomatic anemia. 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  Clinical Impression  Pt admitted with above diagnosis. Pt ambulated 160' with RW, no loss of balance. She is mobilizing well. She reports plan is to return to [facility] for management of wounds. Pt demonstrates good awareness of pressure relief for wounds, she minimizes sitting time and lies on her sides when in bed.  Pt currently with functional limitations due to the deficits listed below (see PT Problem List). Pt will benefit from acute skilled PT to increase their independence and safety with mobility to allow discharge.           If plan is discharge home, recommend the following: A little help with bathing/dressing/bathroom;Assistance with cooking/housework;Assist for transportation;Help with stairs or ramp for entrance   Can travel by private vehicle   Yes    Equipment Recommendations None recommended by PT  Recommendations for Other Services       Functional Status Assessment Patient has had a recent decline in their functional status and demonstrates the ability to make significant improvements in function in a reasonable and predictable amount of time.     Precautions / Restrictions Precautions Precautions: Fall Recall of Precautions/Restrictions:  Intact Precaution/Restrictions Comments: h/o B transmet amputations Restrictions Weight Bearing Restrictions Per Provider Order: No      Mobility  Bed Mobility Overal bed mobility: Modified Independent                  Transfers Overall transfer level: Needs assistance Equipment used: Rolling walker (2 wheels) Transfers: Sit to/from Stand Sit to Stand: Supervision                Ambulation/Gait Ambulation/Gait assistance: Supervision Gait Distance (Feet): 160 Feet Assistive device: Rolling walker (2 wheels) Gait Pattern/deviations: Step-through pattern, Decreased stride length Gait velocity: WFL     General Gait Details: steady, no loss of balance  Stairs            Wheelchair Mobility     Tilt Bed    Modified Rankin (Stroke Patients Only)       Balance Overall balance assessment: Modified Independent                                           Pertinent Vitals/Pain Pain Assessment Pain Assessment: No/denies pain    Home Living Family/patient expects to be discharged to:: Skilled nursing facility                        Prior Function Prior Level of Function : Independent/Modified Independent             Mobility Comments: PTA was walking with a rollator at [employer]; denies falls in past 6 months       Extremity/Trunk Assessment   Upper Extremity Assessment Upper Extremity Assessment: Overall WFL for tasks assessed  Lower Extremity Assessment Lower Extremity Assessment: Overall WFL for tasks assessed    Cervical / Trunk Assessment Cervical / Trunk Assessment: Normal  Communication   Communication Communication: No apparent difficulties    Cognition Arousal: Alert Behavior During Therapy: WFL for tasks assessed/performed   PT - Cognitive impairments: No apparent impairments                         Following commands: Intact       Cueing       General Comments       Exercises  Marching x 10 seated both AROM   Assessment/Plan    PT Assessment Patient needs continued PT services  PT Problem List Decreased activity tolerance       PT Treatment Interventions      PT Goals (Current goals can be found in the Care Plan section)  Acute Rehab PT Goals Patient Stated Goal: likes to play pool PT Goal Formulation: With patient Time For Goal Achievement: 05/15/24 Potential to Achieve Goals: Good    Frequency Min 3X/week     Co-evaluation               AM-PAC PT 6 Clicks Mobility  Outcome Measure Help needed turning from your back to your side while in a flat bed without using bedrails?: None Help needed moving from lying on your back to sitting on the side of a flat bed without using bedrails?: A Little Help needed moving to and from a bed to a chair (including a wheelchair)?: None Help needed standing up from a chair using your arms (e.g., wheelchair or bedside chair)?: None Help needed to walk in hospital room?: None Help needed climbing 3-5 steps with a railing? : A Little 6 Click Score: 22    End of Session Equipment Utilized During Treatment: Gait belt Activity Tolerance: Patient tolerated treatment well Patient left: in bed;with call bell/phone within reach;with bed alarm set Nurse Communication: Mobility status PT Visit Diagnosis: Difficulty in walking, not elsewhere classified (R26.2)    Time: 1014-1030 PT Time Calculation (min) (ACUTE ONLY): 16 min   Charges:   PT Evaluation $PT Eval Low Complexity: 1 Low   PT General Charges $$ ACUTE PT VISIT: 1 Visit         Sylvan Delon Copp PT 05/01/2024  Acute Rehabilitation Services  Office [phone]

## 2024-05-01 NOTE — Progress Notes (Signed)
 Arrived to room for central line flush/cap for home. White lumen on PICC found with dried blood on hub. Cap changed x2 for questionable leakage of cap. Line flushed with new cap, no drainage noted.

## 2024-05-01 NOTE — Discharge Summary (Signed)
 Physician Discharge Summary  [name] FMW:[id number] DOB: 1941-01-05 DOA: 04/29/2024  PCP: Chrystal Lamarr RAMAN, MD  Admit date: 04/29/2024 Discharge date: 05/01/2024  Admitted From: Home Disposition:  SNF  Discharge Condition:Stable CODE STATUS:FULL Diet recommendation: Carb Modified   Brief/Interim Summary: Patient is a 83 year old female with history of chronic normocytic anemia, coronary artery disease, CABG, paroxysmal A-fib on Xarelto , carotid artery disease, peripheral vascular disease, angioplasty of left SFA segment, hyperlipidemia, CKD stage III, diabetes type 2, transmetatarsal amputation of both feet, GERD, hyperlipidemia, hypertension, cholangiocarcinoma, 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  She recently started taking Xarelto  for her paroxysmal A-fib. On presentation, she was hemodynamically stable.  Lab work showed hemoglobin in the range of 5.  Given total of 3 units of blood transfusion is hospitalization.  Current hemoglobin stable in the range of 7.  Anemia is thought to be secondary to slow oozing from her sacral wounds.  GI did not plan for any endoscopic intervention.  Xarelto  will be held on discharge.  Patient does not want to stay another day and wants to be discharged back to [facility].    Following problems were addressed during the hospitalization:   Symptomatic anemia/acute on chronic normocytic anemia: Likely from chronic oozing from sacral ulcer.  Recently started on Xarelto .  Continue to hold.  She was seen by GI on previous admission and they did not recommend any endoscopic workup.  Currently on pantoprazole  which will be continued.  I discussed with [facility], Dr. Burnette about this, he continued to emphasize with no plan for endoscopic procedures.  Hemoglobin in the range of 7.4  today.  Will be given another unit of blood transfusion and also iron  infusion before discharge.  Continue oral iron  supplementation on discharge   Paroxysmal A-fib: Currently in normal sinus rhythm.  Continue to hold Xarelto  for now.  She can discuss with primary care physician oncologist before restarting.  Continue metoprolol    Pressure ulcer of the sacrum: She has history of sacral osteomyelitis and is on meropenem  and doxycycline  as per ID.  Continue those antibiotics.  Wound care already consulted.  Plan to continue antibiotics through 8/15.  Continue zinc    Hypertension: Currently blood pressure stable.  On metoprolol , hydrochlorothiazide    Hyperlipidemia: On Crestor  before.  She has mildly elevated liver enzymes.  Will hold Crestor  for now.  Can resume when her liver enzymes are normal   Controlled diabetes type 2: On oral medications at home.   She has severe nonproliferative retinopathy of both eyes   History of coronary artery disease/carotid artery disease: Was on statin, will be held until her liver enzymes are normal   CKD stage IIIa/hyperkalemia: Currently kidney function at baseline.  Hyperkalemia resolved after giving Lokelma    Cholangiocarcinoma: We recommend to follow-up with oncology as an outpatient.  Has mildly  elevated liver enzymes   Severe protein calorie malnutrition: Albumin  of less than 1.5. Nutritionist consulted   Debility/deconditioning: Patient currently residing at a skilled facility.  TOC consulted      Discharge Diagnoses:  Principal Problem:   Symptomatic anemia Active Problems:   Acute on chronic anemia   Essential hypertension   Dyslipidemia   Carotid artery disease (HCC)   Controlled diabetes mellitus with severe nonproliferative retinopathy of both eyes (HCC)   Paroxysmal atrial fibrillation (HCC)   Chronic kidney disease, stage 3a (  HCC)   Cholangiocarcinoma (HCC)   Pressure injury of skin   Protein-calorie malnutrition,  severe    Discharge Instructions  Discharge Instructions     Diet Carb Modified   Complete by: As directed    Discharge instructions   Complete by: As directed    1)Please take your medications as instructed 2) Do a CBC test to check your hemoglobin on Monday(05/04/24).  Continue to hold Xarelto .  Discuss with your primary doctor and oncologist before restarting Xarelto .   Discharge wound care:   Complete by: As directed    As per wound care   Increase activity slowly   Complete by: As directed       Allergies as of 05/01/2024       Reactions   Versed  [midazolam ] Anxiety   Frantic, out of my mind, agitated    Bactrim  [sulfamethoxazole -trimethoprim ] Other (See Comments)   ^ K+( elevated)    Augmentin [amoxicillin-pot Clavulanate] Other (See Comments)   Elevates potassium level   Demerol [meperidine]    Delusional    Meperidine Hcl    Other reaction(s): delusional   Scopolamine    Delusional  Patch *   Tramadol  Other (See Comments)   Keeps awake        Medication List     STOP taking these medications    rivaroxaban  10 MG Tabs tablet Commonly known as: XARELTO        TAKE these medications    acetaminophen  500 MG tablet Commonly known as: TYLENOL  Take 1,000 mg by mouth every 6 (six) hours as needed for moderate pain (pain score 4-6), fever or mild pain (pain score 1-3).   brimonidine  0.2 % ophthalmic solution Commonly known as: ALPHAGAN  Place 1 drop into the right eye 2 (two) times daily.   doxycycline  100 MG tablet Commonly known as: VIBRA -TABS Take 1 tablet (100 mg total) by mouth every 12 (twelve) hours.   feeding supplement Liqd Take 237 mLs by mouth 2 (two) times daily between meals.   ferrous sulfate  325 (65 FE) MG EC tablet Take 1 tablet (325 mg total) by mouth daily before breakfast. To be taken with a cup of orange juice on an otherwise empty stomach if possible   HumaLOG  Mix 75/25 KwikPen (75-25) 100 UNIT/ML KwikPen Generic drug: Insulin   Lispro Prot & Lispro Inject 6 Units into the skin daily.   hydrochlorothiazide  12.5 MG tablet Commonly known as: HYDRODIURIL  Take 12.5 mg by mouth daily.   Jardiance  25 MG Tabs tablet Generic drug: empagliflozin  Take 25 mg by mouth daily.   magnesium  oxide 400 (240 Mg) MG tablet Commonly known as: MAG-OX Take 400 mg by mouth 2 (two) times daily.   meropenem  1 g in sodium chloride  0.9 % 100 mL Inject 1 g into the vein every 12 (twelve) hours.   Metformin  HCl 500 MG/5ML Soln Take 10 mLs by mouth in the morning and at bedtime.   metoprolol  tartrate 25 MG tablet Commonly known as: LOPRESSOR  Take 1 tablet (25 mg total) by mouth 2 (two) times daily.   mirtazapine  7.5 MG tablet Commonly known as: REMERON  Take 15 mg by mouth at bedtime.   pantoprazole  40 MG tablet Commonly known as: Protonix  Take 1 tablet (40 mg total) by mouth 2 (two) times daily. What changed:  medication strength how much to take when to take this   polyethylene glycol 17 g packet Commonly known as: MIRALAX  / GLYCOLAX  Take 17 g by mouth daily. What changed:  when to take this  reasons to take this   potassium chloride  10 MEQ CR capsule Commonly known as: MICRO-K  Take 10 mEq by mouth 2 (two) times daily.   PROBIOTIC PO Take 1 capsule by mouth daily.   repaglinide  1 MG tablet Commonly known as: PRANDIN  Take 1 mg by mouth 2 (two) times daily before a meal.   rosuvastatin  40 MG tablet Commonly known as: CRESTOR  Take 1 tablet (40 mg total) by mouth daily.   Systane Balance 0.6 % Soln Generic drug: Propylene Glycol Place 1 drop into the left eye 3 (three) times daily as needed (dry/irritated eyes).   telmisartan  40 MG tablet Commonly known as: MICARDIS  Take 40 mg by mouth daily.   traZODone  100 MG tablet Commonly known as: DESYREL  Take 100 mg by mouth at bedtime.   zinc  sulfate (50mg  elemental zinc ) 220 (50 Zn) MG capsule Take 1 capsule (220 mg total) by mouth daily for 14 days. Start  taking on: May 02, 2024               Discharge Care Instructions  (From admission, onward)           Start     Ordered   05/01/24 0000  Discharge wound care:       Comments: As per wound care   05/01/24 1041            Follow-up Information     Chrystal Lamarr RAMAN, MD. Schedule an appointment as soon as possible for a visit in 1 week(s).   Specialty: Family Medicine Contact information: [address] [place] [address] [phone]                Allergies  Allergen Reactions   Versed  [Midazolam ] Anxiety    Frantic, out of my mind, agitated    Bactrim  [Sulfamethoxazole -Trimethoprim ] Other (See Comments)    ^ K+( elevated)    Augmentin [Amoxicillin-Pot Clavulanate] Other (See Comments)    Elevates potassium level   Demerol [Meperidine]     Delusional    Meperidine Hcl     Other reaction(s): delusional   Scopolamine     Delusional   Patch *   Tramadol  Other (See Comments)    Keeps awake    Consultations: Oncology   Procedures/Studies: DG CHEST PORT 1 VIEW Result Date: 04/29/2024 CLINICAL DATA:  PICC placement. EXAM: PORTABLE CHEST 1 VIEW COMPARISON:  Feb 12, 2024. FINDINGS: Stable cardiomediastinal silhouette. Status post coronary artery bypass graft. Left subclavian Port-A-Cath is noted and unchanged. Interval placement of right-sided PICC line with distal tip in expected position of cavoatrial junction. No pneumothorax is noted. Lungs are clear. Degenerative changes are seen involving both shoulders. IMPRESSION: Interval placement of right-sided PICC line with distal tip in expected position of cavoatrial junction. Electronically Signed   By: Lynwood Landy Raddle M.D.   On: 04/29/2024 12:51      Subjective: Patient seen and examined at bedside today.  She is hemodynamically stable.  Comfortable.  No new episode of hematochezia or melena.  She really wants to go back to [facility].  We discussed about getting a blood transfusion  and IV antibiotic infusion before discharge. I told her to keep holding Xarelto  until she discusses with her PCP and oncologist  Discharge Exam: Vitals:   05/01/24 0522 05/01/24 0841  BP: (!) 116/49 (!) 126/43  Pulse: 76 76  Resp: 17   Temp: 98.4 F (36.9 C)   SpO2: 99%    Vitals:   04/30/24 1341 04/30/24  1956 05/01/24 0522 05/01/24 0841  BP: (!) 111/49 (!) 144/43 (!) 116/49 (!) 126/43  Pulse: 72 73 76 76  Resp: 17 18 17    Temp: 97.7 F (36.5 C) (!) 97.5 F (36.4 C) 98.4 F (36.9 C)   TempSrc:  Oral Oral   SpO2: 100% 97% 99%   Weight:      Height:        General: Pt is alert, awake, not in acute distress, chronically deconditioned Cardiovascular: RRR, S1/S2 +, no rubs, no gallops Respiratory: CTA bilaterally, no wheezing, no rhonchi Abdominal: Soft, NT, ND, bowel sounds + Extremities: no edema, no cyanosis, bilateral transmetatarsal amputation    The results of significant diagnostics from this hospitalization (including imaging, microbiology, ancillary and laboratory) are listed below for reference.     Microbiology: Recent Results (from the past 240 hours)  MRSA Next Gen by PCR, Nasal     Status: None   Collection Time: 04/29/24  9:50 AM   Specimen: Nasal Mucosa; Nasal Swab  Result Value Ref Range Status   MRSA by PCR Next Gen NOT DETECTED NOT DETECTED Final    Comment: (NOTE) The GeneXpert MRSA Assay (FDA approved for NASAL specimens only), is one component of a comprehensive MRSA colonization surveillance program. It is not intended to diagnose MRSA infection nor to guide or monitor treatment for MRSA infections. Test performance is not FDA approved in patients less than 2 years old. Performed at [facility], [address], [place], [place] [address]      Labs: BNP (last 3 results) No results for input(s): BNP in the last 8760 hours. Basic Metabolic Panel: Recent Labs  Lab 04/28/24 1326 04/29/24 0219 04/30/24 0312  05/01/24 0323  NA 135 138 135 135  K 5.6* 5.0 5.2* 4.7  CL 109 107 109 108  CO2 24 21* 21* 21*  GLUCOSE 117* 88 36* 164*  BUN 33* 32* 28* 30*  CREATININE 1.06* 1.08* 1.17* 1.35*  CALCIUM  8.5* 8.7* 8.1* 8.0*   Liver Function Tests: Recent Labs  Lab 04/28/24 1326 04/29/24 0219 04/30/24 0312  AST 78* 76* 69*  ALT 59* 54* 45*  ALKPHOS  --  403* 333*  BILITOT 0.4 0.4 0.5  PROT 4.1* 4.4* 3.6*  ALBUMIN   --  1.7* <1.5*   Recent Labs  Lab 04/29/24 0219  LIPASE 35   No results for input(s): AMMONIA in the last 168 hours. CBC: Recent Labs  Lab 04/28/24 1326 04/29/24 0219 04/29/24 1112 04/30/24 0312 05/01/24 0323  WBC 10.1 10.5  --  10.7* 8.1  HGB 6.0* 5.7* 9.0* 7.5* 7.4*  HCT 20.1* 18.8* 27.3* 23.4* 23.7*  MCV 95.3 93.5  --  92.5 94.0  PLT 181 176  --  138* 123*   Cardiac Enzymes: No results for input(s): CKTOTAL, CKMB, CKMBINDEX, TROPONINI in the last 168 hours. BNP: Invalid input(s): POCBNP CBG: Recent Labs  Lab 04/30/24 1626 04/30/24 2102 05/01/24 0010 05/01/24 0519 05/01/24 0723  GLUCAP 186* 157* 184* 130* 116*   D-Dimer No results for input(s): DDIMER in the last 72 hours. Hgb A1c No results for input(s): HGBA1C in the last 72 hours. Lipid Profile No results for input(s): CHOL, HDL, LDLCALC, TRIG, CHOLHDL, LDLDIRECT in the last 72 hours. Thyroid  function studies No results for input(s): TSH, T4TOTAL, T3FREE, THYROIDAB in the last 72 hours.  Invalid input(s): FREET3 Anemia work up Recent Labs    05/01/24 0323  FERRITIN 93  TIBC 193*  IRON  25*  RETICCTPCT 3.5*  Urinalysis    Component Value Date/Time   COLORURINE YELLOW 02/12/2024 1350   APPEARANCEUR HAZY (A) 02/12/2024 1350   LABSPEC 1.013 02/12/2024 1350   PHURINE 5.0 02/12/2024 1350   GLUCOSEU >=500 (A) 02/12/2024 1350   HGBUR NEGATIVE 02/12/2024 1350   BILIRUBINUR NEGATIVE 02/12/2024 1350   KETONESUR NEGATIVE 02/12/2024 1350   PROTEINUR 30 (A)  02/12/2024 1350   NITRITE NEGATIVE 02/12/2024 1350   LEUKOCYTESUR SMALL (A) 02/12/2024 1350   Sepsis Labs Recent Labs  Lab 04/28/24 1326 04/29/24 0219 04/30/24 0312 05/01/24 0323  WBC 10.1 10.5 10.7* 8.1   Microbiology Recent Results (from the past 240 hours)  MRSA Next Gen by PCR, Nasal     Status: None   Collection Time: 04/29/24  9:50 AM   Specimen: Nasal Mucosa; Nasal Swab  Result Value Ref Range Status   MRSA by PCR Next Gen NOT DETECTED NOT DETECTED Final    Comment: (NOTE) The GeneXpert MRSA Assay (FDA approved for NASAL specimens only), is one component of a comprehensive MRSA colonization surveillance program. It is not intended to diagnose MRSA infection nor to guide or monitor treatment for MRSA infections. Test performance is not FDA approved in patients less than 23 years old. Performed at [facility], [address], [place], [place] [address]     Please note: You were cared for by a hospitalist during your hospital stay. Once you are discharged, your primary care physician will handle any further medical issues. Please note that NO REFILLS for any discharge medications will be authorized once you are discharged, as it is imperative that you return to your primary care physician (or establish a relationship with a primary care physician if you do not have one) for your post hospital discharge needs so that they can reassess your need for medications and monitor your lab values.    Time coordinating discharge: 40 minutes  SIGNED:   Ivonne Mustache, MD  [facility] 05/01/2024, 10:41 AM Pager [phone]  If 7PM-7AM, please contact night-coverage [email or web address] Password [email or web address]

## 2024-05-01 NOTE — TOC Transition Note (Addendum)
 Transition of Care [facility]) - Discharge Note   Patient Details  Name: [name] MRN: [id number] Date of Birth: [birth year]  Transition of Care [facility]) CM/SW Contact:  Bascom Service, RN Phone Number: 05/01/2024, 11:33 AM   Clinical Narrative: Returning back  to [facility]-going to rm#[id number],report#[phone]. Awaiting completion of iv iron  prior calling [facility].No further CM needs. -11:53a called patient to inform her  the facility cannot provide their own transport for hospital return. we will continue with our process through [facility]-patient verbalized understanding.  -3:25p [facility] called for 5p p/u scheduled.No further CM needs.    Final next level of care: Skilled Nursing Facility Barriers to Discharge: No Barriers Identified   Patient Goals and CMS Choice Patient states their goals for this hospitalization and ongoing recovery are:: Return [facility] CMS Medicare.gov Compare Post Acute Care list provided to:: Patient Choice offered to / list presented to : Patient [place] ownership interest in [facility].provided to:: Patient    Discharge Placement   Existing PASRR number confirmed : 04/30/24          Patient chooses bed at: [name] Patient to be transferred to facility by: [facility] Name of family member notified: Patient Patient and family notified of of transfer: 05/01/24  Discharge Plan and Services Additional resources added to the After Visit Summary for     Discharge Planning Services: CM Consult Post Acute Care Choice: Skilled Nursing Facility                               Social Drivers of Health (SDOH) Interventions SDOH Screenings   Food Insecurity: No Food Insecurity (04/29/2024)  Housing: Patient Declined (04/29/2024)  Transportation Needs: No Transportation Needs (04/29/2024)  Utilities: Not At Risk (04/29/2024)  Depression (PHQ2-9): Low Risk  (11/27/2021)  Social Connections: Moderately Isolated (04/29/2024)  Tobacco Use: Medium Risk  (04/29/2024)     Readmission Risk Interventions    04/16/2024    3:13 PM  Readmission Risk Prevention Plan  Transportation Screening Complete  PCP or Specialist Appt within 3-5 Days Complete  HRI or Home Care Consult Complete  Social Work Consult for Recovery Care Planning/Counseling Complete  Medication Review Oceanographer) Referral to Pharmacy

## 2024-05-01 NOTE — TOC Progression Note (Signed)
 Transition of Care [facility]) - Progression Note    Patient Details  Name: [name] MRN: [id number] Date of Birth: 1941/09/15  [facility] CM/SW Contact  Jarret Torre, Nathanel, RN Phone Number: 05/01/2024, 9:46 AM  Clinical Narrative:  can return back [facility] aware today or over weekend. [facility] in d/c summary). Should get iv iron  dose prior d/c if needed.     Expected Discharge Plan: Skilled Nursing Facility Barriers to Discharge: No Barriers Identified               Expected Discharge Plan and Services   Discharge Planning Services: CM Consult Post Acute Care Choice: Skilled Nursing Facility Living arrangements for the past 2 months: Skilled Nursing Facility                                       Social Drivers of Health (SDOH) Interventions SDOH Screenings   Food Insecurity: No Food Insecurity (04/29/2024)  Housing: Patient Declined (04/29/2024)  Transportation Needs: No Transportation Needs (04/29/2024)  Utilities: Not At Risk (04/29/2024)  Depression (PHQ2-9): Low Risk  (11/27/2021)  Social Connections: Moderately Isolated (04/29/2024)  Tobacco Use: Medium Risk (04/29/2024)    Readmission Risk Interventions    04/16/2024    3:13 PM  Readmission Risk Prevention Plan  Transportation Screening Complete  PCP or Specialist Appt within 3-5 Days Complete  HRI or Home Care Consult Complete  Social Work Consult for Recovery Care Planning/Counseling Complete  Medication Review Oceanographer) Referral to Pharmacy

## 2024-05-01 NOTE — [place] FL2 (Addendum)
 [place]  MEDICAID FL2 LEVEL OF CARE FORM     IDENTIFICATION  Patient Name: [name] Birthdate: 1941-02-11 Sex: female Admission Date (Current Location): 04/29/2024  [facility] and [place] Number:      Facility and Address:  [facility],  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) 04/29/2024   Medication management 04/28/2024   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   Anemia 04/15/2024   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   Pressure injury of sacral region, stage 4 (HCC) 04/15/2024   Osteomyelitis of sacrum (HCC) 04/15/2024   Acute diarrhea 04/15/2024   Acute on chronic anemia 04/14/2024   Paroxysmal atrial fibrillation (HCC) 04/14/2024   Chronic kidney disease, stage 3a (HCC) 04/14/2024   Hyperlipidemia associated with type 2 diabetes mellitus (HCC) 04/14/2024   Severe sepsis (HCC) 02/12/2024   Pneumonia 02/12/2024   Transaminitis 02/12/2024   Atrial fibrillation with RVR (HCC) 02/12/2024   Metabolic acidosis 02/12/2024   Port-A-Cath in place 05/02/2023   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   Coronary artery disease involving native heart without angina pectoris 04/05/2023   Jaundice 04/04/2023   Cholelithiasis 10/12/2022   Symptomatic cholelithiasis 10/12/2022   Family history of malignant neoplasm of digestive organ 02/17/2022   Gangrenous disorder (HCC) 02/17/2022   Gastro-esophageal reflux disease without esophagitis  02/17/2022   Hyperglycemia due to type 2 diabetes mellitus (HCC) 02/17/2022   Iron  deficiency anemia 02/17/2022   Leukocytosis 02/17/2022   Moderate nonproliferative diabetic retinopathy of both eyes without macular edema associated with type 2 diabetes mellitus (HCC) 02/17/2022   Other acute osteomyelitis, left ankle and foot (HCC) 02/17/2022   Pure hypercholesterolemia 02/17/2022   Skin ulcer of sacrum, limited to breakdown of skin (HCC) 02/17/2022   Thrombocytopenic disorder (HCC) 02/17/2022   Tinnitus 02/17/2022   S/P angiogram of extremity 10/13/2021   Osteomyelitis of foot, left, acute (HCC) 04/27/2021   Cellulitis in diabetic foot (HCC) 04/27/2021   AKI (acute kidney injury) (HCC) 04/27/2021   Hyponatremia 04/27/2021   Symptomatic anemia 04/27/2021   Cellulitis of left lower extremity    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   Critical lower limb ischemia (HCC) 08/03/2019   Encounter for immunization 07/09/2016   Essential hypertension 03/31/2013   Dyslipidemia 03/31/2013   Type 2 diabetes mellitus with stage 3a chronic kidney disease, without long-term current use of insulin  (HCC) 03/31/2013   Overweight (BMI 25.0-29.9) 03/31/2013   Carotid artery  disease (HCC) 03/31/2013   PAD (peripheral artery disease) (HCC) 03/31/2013    Orientation RESPIRATION BLADDER Height & Weight     Self, Time, Situation, Place  Normal Continent  Weight: 48.1 kg Height:  5' 5 (165.1 cm)  BEHAVIORAL SYMPTOMS/MOOD NEUROLOGICAL BOWEL NUTRITION STATUS      Continent Diet (soft)  AMBULATORY STATUS COMMUNICATION OF NEEDS Skin   Limited Assist Verbally PU Stage and Appropriate Care (sacral ulcer)                       Personal Care Assistance Level of Assistance  Bathing, Feeding, Dressing Bathing Assistance: Limited assistance Feeding assistance: Limited assistance Dressing Assistance: Limited assistance     Functional Limitations Info  Sight, Hearing, Speech Sight Info: Adequate Hearing Info: Adequate Speech Info: Adequate    SPECIAL CARE FACTORS FREQUENCY  PT (By licensed PT), OT (By licensed OT)     PT Frequency: 5x week OT Frequency: 5x week            Contractures Contractures Info: Not present    Additional Factors Info  Code Status, Allergies (continue long term iv abx through 8/15) Code Status Info: Full Allergies Info: Versed  (Midazolam ), Bactrim  (Sulfamethoxazole -trimethoprim ), Augmentin (Amoxicillin-pot Clavulanate), Demerol (Meperidine), Meperidine Hcl, Scopolamine, Tramadol            Current Medications (05/01/2024):  This is the current hospital active medication list Current Facility-Administered Medications  Medication Dose Route Frequency Provider Last Rate Last Admin   0.9 %  sodium chloride  infusion (Manually program via Guardrails IV Fluids)   Intravenous Once Groce, Christopher F, PA-C   Held at 04/29/24 0304   0.9 %  sodium chloride  infusion (Manually program via Guardrails IV Fluids)   Intravenous Once Adhikari, Amrit, MD       acetaminophen  (TYLENOL ) tablet 650 mg  650 mg Oral Q6H PRN Celinda Alm Lot, MD       Or   acetaminophen  (TYLENOL ) suppository 650 mg  650 mg Rectal Q6H PRN Celinda Alm Lot, MD       artificial tears ophthalmic solution 1 drop  1 drop Both Eyes TID PRN Celinda Alm Lot, MD       B-complex with vitamin C tablet 1 tablet  1 tablet Oral Daily Jillian Buttery, MD   1 tablet at 05/01/24 0847   brimonidine  (ALPHAGAN ) 0.2 % ophthalmic solution 1 drop  1 drop Right Eye BID Celinda Alm Lot, MD   1 drop at 05/01/24 9148   Chlorhexidine  Gluconate Cloth 2 % PADS 6 each  6 each Topical Daily Celinda Alm Lot, MD   6 each at 04/30/24 9044   doxycycline  (VIBRA -TABS) tablet 100 mg  100 mg Oral Q12H Celinda Alm Lot, MD   100 mg at 05/01/24 0840   hydrochlorothiazide  (HYDRODIURIL ) tablet 12.5 mg  12.5 mg Oral Daily Adhikari, Amrit, MD   12.5 mg at 05/01/24 0840   iron  sucrose (VENOFER ) 200 mg in sodium chloride  0.9 % 100 mL IVPB  200 mg Intravenous Once Adhikari, Amrit, MD       lactose free nutrition (BOOST PLUS) liquid 237 mL  237 mL Oral TID BM Adhikari, Amrit, MD   237 mL at 04/30/24 2123   magnesium  oxide (MAG-OX) tablet 400 mg  400 mg Oral BID Celinda Alm Lot, MD   400 mg at 05/01/24 0840   meropenem  (MERREM ) 1 g in sodium chloride  0.9 % 100 mL IVPB  1 g Intravenous Q12H Celinda Alm Lot, MD 200 mL/hr  at 05/01/24 0859 1 g at 05/01/24 0859   metoprolol  tartrate (LOPRESSOR ) tablet 25 mg  25 mg Oral BID Celinda Alm Lot, MD   25 mg at 05/01/24 9158   mirtazapine  (REMERON ) tablet 15 mg  15 mg Oral QHS Celinda Alm Lot, MD   15 mg at 04/30/24 2123   ondansetron  (ZOFRAN ) tablet 4 mg  4 mg Oral Q6H PRN Celinda Alm Lot, MD       Or   ondansetron  (ZOFRAN ) injection 4 mg  4 mg Intravenous Q6H PRN Celinda Alm Lot, MD       pantoprazole  (PROTONIX ) injection 40 mg  40 mg Intravenous Q12H Celinda Alm Lot, MD   40 mg at 05/01/24 9150   polyethylene glycol (MIRALAX  / GLYCOLAX ) packet 17 g  17 g Oral Daily PRN Celinda Alm Lot, MD       sodium chloride  flush (NS) 0.9 % injection 10-40 mL  10-40 mL Intracatheter Q12H Ortiz, David Manuel, MD   10 mL at 04/30/24 2123   sodium chloride  flush (NS) 0.9 % injection 10-40 mL  10-40 mL Intracatheter PRN Celinda Alm Lot, MD       traZODone  (DESYREL ) tablet 100 mg  100 mg Oral QHS Celinda Alm Lot, MD   100 mg at 04/30/24 2123   zinc  sulfate (50mg  elemental zinc ) capsule 220 mg  220 mg Oral Daily Jillian Buttery, MD   220 mg at 05/01/24 0840     Discharge Medications: Please see discharge summary for a list of discharge medications.  Relevant Imaging Results:  Relevant Lab Results:   Additional Information ss#[phone];continue long term iv abx through 8/15  [employer], [place]

## 2024-05-04 DIAGNOSIS — I480 Paroxysmal atrial fibrillation: Secondary | ICD-10-CM | POA: Diagnosis not present

## 2024-05-04 DIAGNOSIS — D631 Anemia in chronic kidney disease: Secondary | ICD-10-CM | POA: Diagnosis not present

## 2024-05-04 DIAGNOSIS — Z7901 Long term (current) use of anticoagulants: Secondary | ICD-10-CM | POA: Diagnosis not present

## 2024-05-04 DIAGNOSIS — Z9289 Personal history of other medical treatment: Secondary | ICD-10-CM | POA: Diagnosis not present

## 2024-05-04 DIAGNOSIS — I2510 Atherosclerotic heart disease of native coronary artery without angina pectoris: Secondary | ICD-10-CM | POA: Diagnosis not present

## 2024-05-04 DIAGNOSIS — I10 Essential (primary) hypertension: Secondary | ICD-10-CM | POA: Diagnosis not present

## 2024-05-04 DIAGNOSIS — N183 Chronic kidney disease, stage 3 unspecified: Secondary | ICD-10-CM | POA: Diagnosis not present

## 2024-05-04 DIAGNOSIS — C23 Malignant neoplasm of gallbladder: Secondary | ICD-10-CM | POA: Diagnosis not present

## 2024-05-04 DIAGNOSIS — L89159 Pressure ulcer of sacral region, unspecified stage: Secondary | ICD-10-CM | POA: Diagnosis not present

## 2024-05-04 DIAGNOSIS — E43 Unspecified severe protein-calorie malnutrition: Secondary | ICD-10-CM | POA: Diagnosis not present

## 2024-05-04 DIAGNOSIS — M4628 Osteomyelitis of vertebra, sacral and sacrococcygeal region: Secondary | ICD-10-CM | POA: Diagnosis not present

## 2024-05-04 DIAGNOSIS — E1122 Type 2 diabetes mellitus with diabetic chronic kidney disease: Secondary | ICD-10-CM | POA: Diagnosis not present

## 2024-05-04 LAB — BPAM RBC
Blood Product Expiration Date: 202509062359
Blood Product Expiration Date: 202509062359
Blood Product Expiration Date: 202509092359
ISSUE DATE / TIME: 202508060339
ISSUE DATE / TIME: 202508060625
ISSUE DATE / TIME: 202508081121
Unit Type and Rh: 5100
Unit Type and Rh: 5100
Unit Type and Rh: 5100

## 2024-05-04 LAB — TYPE AND SCREEN
ABO/RH(D): O POS
Antibody Screen: NEGATIVE
Unit division: 0
Unit division: 0
Unit division: 0

## 2024-05-05 ENCOUNTER — Other Ambulatory Visit: Payer: Self-pay

## 2024-05-05 DIAGNOSIS — L89154 Pressure ulcer of sacral region, stage 4: Secondary | ICD-10-CM | POA: Diagnosis not present

## 2024-05-06 ENCOUNTER — Telehealth: Payer: Self-pay | Admitting: Hematology

## 2024-05-06 NOTE — Telephone Encounter (Signed)
 Scheduled appointments per staff message. Called and left a VM with Harlene Conservation officer, historic buildings) at the facility [facility] [phone]) in which the patient resides. Informed Harlene of the appointment details for the patient as well as gave the direct line for a call back.

## 2024-05-07 ENCOUNTER — Telehealth: Payer: Self-pay | Admitting: Internal Medicine

## 2024-05-07 ENCOUNTER — Telehealth: Payer: Self-pay

## 2024-05-07 ENCOUNTER — Other Ambulatory Visit: Payer: Self-pay | Admitting: Internal Medicine

## 2024-05-07 DIAGNOSIS — E1169 Type 2 diabetes mellitus with other specified complication: Secondary | ICD-10-CM | POA: Diagnosis not present

## 2024-05-07 DIAGNOSIS — Z9289 Personal history of other medical treatment: Secondary | ICD-10-CM | POA: Diagnosis not present

## 2024-05-07 DIAGNOSIS — D500 Iron deficiency anemia secondary to blood loss (chronic): Secondary | ICD-10-CM | POA: Diagnosis not present

## 2024-05-07 DIAGNOSIS — M4628 Osteomyelitis of vertebra, sacral and sacrococcygeal region: Secondary | ICD-10-CM | POA: Diagnosis not present

## 2024-05-07 DIAGNOSIS — Z794 Long term (current) use of insulin: Secondary | ICD-10-CM | POA: Diagnosis not present

## 2024-05-07 DIAGNOSIS — E1122 Type 2 diabetes mellitus with diabetic chronic kidney disease: Secondary | ICD-10-CM | POA: Diagnosis not present

## 2024-05-07 DIAGNOSIS — I129 Hypertensive chronic kidney disease with stage 1 through stage 4 chronic kidney disease, or unspecified chronic kidney disease: Secondary | ICD-10-CM | POA: Diagnosis not present

## 2024-05-07 DIAGNOSIS — I480 Paroxysmal atrial fibrillation: Secondary | ICD-10-CM | POA: Diagnosis not present

## 2024-05-07 DIAGNOSIS — Z7984 Long term (current) use of oral hypoglycemic drugs: Secondary | ICD-10-CM | POA: Diagnosis not present

## 2024-05-07 DIAGNOSIS — L89154 Pressure ulcer of sacral region, stage 4: Secondary | ICD-10-CM | POA: Diagnosis not present

## 2024-05-07 DIAGNOSIS — N183 Chronic kidney disease, stage 3 unspecified: Secondary | ICD-10-CM | POA: Diagnosis not present

## 2024-05-07 NOTE — Telephone Encounter (Signed)
 Patient referred to infusion pharmacy team for ambulatory infusion of IV iron .  Insurance - Medicare part A and B Site of care - Site of care: [facility] Dx code - D50.9 IV Iron  Therapy - Feraheme 510 mg x 1, patient's plan prefers Feraheme Infusion appointments - Scheduling team will schedule patient as soon as possible.   Thank you,  Norton Blush, PharmD Pharmacist [facility]

## 2024-05-07 NOTE — Telephone Encounter (Signed)
 Auth Submission: NO AUTH NEEDED Site of care: Site of care: [facility] Payer: Medicare A/B with BCBS supplement Medication & CPT/J Code(s) submitted: Feraheme (ferumoxytol) [id number] Diagnosis Code:  Route of submission (phone, fax, portal):  Phone # Fax # Auth type: Buy/Bill PB Units/visits requested: 510mg  x 1 dose Reference number:  Approval from: 05/07/24 to 09/06/24

## 2024-05-11 DIAGNOSIS — L89156 Pressure-induced deep tissue damage of sacral region: Secondary | ICD-10-CM | POA: Diagnosis not present

## 2024-05-11 DIAGNOSIS — M4628 Osteomyelitis of vertebra, sacral and sacrococcygeal region: Secondary | ICD-10-CM | POA: Diagnosis not present

## 2024-05-11 DIAGNOSIS — M6259 Muscle wasting and atrophy, not elsewhere classified, multiple sites: Secondary | ICD-10-CM | POA: Diagnosis not present

## 2024-05-11 DIAGNOSIS — M6281 Muscle weakness (generalized): Secondary | ICD-10-CM | POA: Diagnosis not present

## 2024-05-12 ENCOUNTER — Emergency Department (HOSPITAL_COMMUNITY)
Admission: EM | Admit: 2024-05-12 | Discharge: 2024-05-12 | Disposition: A | Discharge status: Home / Self Care | Attending: Emergency Medicine | Admitting: Emergency Medicine

## 2024-05-12 ENCOUNTER — Encounter (HOSPITAL_COMMUNITY): Payer: Self-pay

## 2024-05-12 ENCOUNTER — Emergency Department (HOSPITAL_COMMUNITY)

## 2024-05-12 DIAGNOSIS — I959 Hypotension, unspecified: Secondary | ICD-10-CM | POA: Diagnosis not present

## 2024-05-12 DIAGNOSIS — R531 Weakness: Secondary | ICD-10-CM | POA: Diagnosis not present

## 2024-05-12 DIAGNOSIS — Z79899 Other long term (current) drug therapy: Secondary | ICD-10-CM | POA: Diagnosis not present

## 2024-05-12 DIAGNOSIS — Z7984 Long term (current) use of oral hypoglycemic drugs: Secondary | ICD-10-CM | POA: Insufficient documentation

## 2024-05-12 DIAGNOSIS — E11649 Type 2 diabetes mellitus with hypoglycemia without coma: Secondary | ICD-10-CM | POA: Diagnosis not present

## 2024-05-12 DIAGNOSIS — E162 Hypoglycemia, unspecified: Secondary | ICD-10-CM | POA: Insufficient documentation

## 2024-05-12 DIAGNOSIS — R29810 Facial weakness: Secondary | ICD-10-CM | POA: Diagnosis not present

## 2024-05-12 DIAGNOSIS — Z951 Presence of aortocoronary bypass graft: Secondary | ICD-10-CM | POA: Diagnosis not present

## 2024-05-12 DIAGNOSIS — I10 Essential (primary) hypertension: Secondary | ICD-10-CM | POA: Diagnosis not present

## 2024-05-12 DIAGNOSIS — Z7401 Bed confinement status: Secondary | ICD-10-CM | POA: Diagnosis not present

## 2024-05-12 LAB — CBG MONITORING, ED
Glucose-Capillary: 180 mg/dL — ABNORMAL HIGH (ref 70–99)
Glucose-Capillary: 41 mg/dL — CL (ref 70–99)
Glucose-Capillary: 42 mg/dL — CL (ref 70–99)
Glucose-Capillary: 50 mg/dL — ABNORMAL LOW (ref 70–99)
Glucose-Capillary: 79 mg/dL (ref 70–99)
Glucose-Capillary: 168 mg/dL — ABNORMAL HIGH (ref 70–99)

## 2024-05-12 LAB — COMPREHENSIVE METABOLIC PANEL WITH GFR
ALT: 86 U/L — ABNORMAL HIGH (ref 0–44)
AST: 179 U/L — ABNORMAL HIGH (ref 15–41)
Albumin: 1.7 g/dL — ABNORMAL LOW (ref 3.5–5.0)
Alkaline Phosphatase: 488 U/L — ABNORMAL HIGH (ref 38–126)
Anion gap: 11 (ref 5–15)
BUN: 32 mg/dL — ABNORMAL HIGH (ref 8–23)
CO2: 21 mmol/L — ABNORMAL LOW (ref 22–32)
Calcium: 8.6 mg/dL — ABNORMAL LOW (ref 8.9–10.3)
Chloride: 104 mmol/L (ref 98–111)
Creatinine, Ser: 1.73 mg/dL — ABNORMAL HIGH (ref 0.44–1.00)
GFR, Estimated: 29 mL/min — ABNORMAL LOW (ref >60)
Glucose, Bld: 161 mg/dL — ABNORMAL HIGH (ref 70–99)
Potassium: 4.6 mmol/L (ref 3.5–5.1)
Sodium: 136 mmol/L (ref 135–145)
Total Bilirubin: 0.6 mg/dL (ref 0.0–1.2)
Total Protein: 4.5 g/dL — ABNORMAL LOW (ref 6.5–8.1)

## 2024-05-12 LAB — URINALYSIS, W/ REFLEX TO CULTURE (INFECTION SUSPECTED)
Bacteria, UA: NONE SEEN
Bilirubin Urine: NEGATIVE
Glucose, UA: >=500 mg/dL — AB
Hgb urine dipstick: NEGATIVE
Ketones, ur: NEGATIVE mg/dL
Nitrite: NEGATIVE
Protein, ur: 30 mg/dL — AB
Specific Gravity, Urine: 1.013 (ref 1.005–1.030)
pH: 5 (ref 5.0–8.0)

## 2024-05-12 LAB — CBC WITH DIFFERENTIAL/PLATELET
Abs Immature Granulocytes: 0.03 K/uL (ref 0.00–0.07)
Basophils Absolute: 0.1 K/uL (ref 0.0–0.1)
Basophils Relative: 1 %
Eosinophils Absolute: 0 K/uL (ref 0.0–0.5)
Eosinophils Relative: 0 %
HCT: 30.9 % — ABNORMAL LOW (ref 36.0–46.0)
Hemoglobin: 9.6 g/dL — ABNORMAL LOW (ref 12.0–15.0)
Immature Granulocytes: 0 %
Lymphocytes Relative: 4 %
Lymphs Abs: 0.5 K/uL — ABNORMAL LOW (ref 0.7–4.0)
MCH: 29.2 pg (ref 26.0–34.0)
MCHC: 31.1 g/dL (ref 30.0–36.0)
MCV: 93.9 fL (ref 80.0–100.0)
Monocytes Absolute: 1.1 K/uL — ABNORMAL HIGH (ref 0.1–1.0)
Monocytes Relative: 9 %
Neutro Abs: 10.2 K/uL — ABNORMAL HIGH (ref 1.7–7.7)
Neutrophils Relative %: 86 %
Platelets: 162 K/uL (ref 150–400)
RBC: 3.29 MIL/uL — ABNORMAL LOW (ref 3.87–5.11)
RDW: 18.8 % — ABNORMAL HIGH (ref 11.5–15.5)
WBC: 12 K/uL — ABNORMAL HIGH (ref 4.0–10.5)
nRBC: 0 % (ref 0.0–0.2)

## 2024-05-12 MED ORDER — SODIUM CHLORIDE 0.9 % IV BOLUS: 1000.0000 mL | Freq: Once | INTRAVENOUS | Status: DC

## 2024-05-12 MED ORDER — DEXTROSE 50 % IV SOLN
1.0000 | Freq: Once | INTRAVENOUS | Status: AC
  Administered 2024-05-12: 50 mL via INTRAVENOUS
  Filled 2024-05-12: qty 50

## 2024-05-12 NOTE — Discharge Instructions (Addendum)
 Return for any problem.  ?

## 2024-05-12 NOTE — ED Notes (Signed)
 Provided pt with 8oz orange juice and

## 2024-05-12 NOTE — ED Triage Notes (Signed)
 Pt BIBA from [facility] for hypoglycemia. EMS reports call out for facial droop that resolved when low blood sugar was treated.   CBG 48  EMS gave 60 g of D10 CBG 84 post D10  Pt is A&O xx 4

## 2024-05-12 NOTE — ED Provider Notes (Signed)
  EMERGENCY DEPARTMENT AT [facility] Provider Note   CSN: [id number] Arrival date & time: 05/12/24  9276     Patient presents with: Hypoglycemia   [name] is a 83 y.o. female.    Hypoglycemia      Prior to Admission medications   Medication Sig Start Date End Date Taking? Authorizing Provider  acetaminophen  (TYLENOL ) 500 MG tablet Take 1,000 mg by mouth every 6 (six) hours as needed for moderate pain (pain score 4-6), fever or mild pain (pain score 1-3).    [provider]  brimonidine  (ALPHAGAN ) 0.2 % ophthalmic solution Place 1 drop into the right eye 2 (two) times daily. 01/19/21   [provider]  doxycycline  (VIBRA -TABS) 100 MG tablet Take 1 tablet (100 mg total) by mouth every 12 (twelve) hours. 04/16/24   Shalhoub, Zachary PARAS, MD  feeding supplement (ENSURE ENLIVE / ENSURE PLUS) LIQD Take 237 mLs by mouth 2 (two) times daily between meals. Patient not taking: Reported on 04/29/2024 02/18/24   Verdene Purchase, MD  ferrous sulfate  325 (65 FE) MG EC tablet Take 1 tablet (325 mg total) by mouth daily before breakfast. To be taken with a cup of orange juice on an otherwise empty stomach if possible 04/16/24 04/16/25  Shalhoub, Zachary PARAS, MD  HUMALOG  MIX 75/25 KWIKPEN (75-25) 100 UNIT/ML KwikPen Inject 6 Units into the skin daily. 04/20/24   [provider]  hydrochlorothiazide  (HYDRODIURIL ) 12.5 MG tablet Take 12.5 mg by mouth daily.    [provider]  JARDIANCE  25 MG TABS tablet Take 25 mg by mouth daily. 04/20/24   [provider]  magnesium  oxide (MAG-OX) 400 (240 Mg) MG tablet Take 400 mg by mouth 2 (two) times daily.    [provider]  meropenem  1 g in sodium chloride  0.9 % 100 mL Inject 1 g into the vein every 12 (twelve) hours. 04/16/24   Shalhoub, Zachary PARAS, MD  Metformin  HCl 500 MG/5ML SOLN Take 10 mLs by mouth in the morning and at bedtime. 04/20/24   [provider]  metoprolol  tartrate  (LOPRESSOR ) 25 MG tablet Take 1 tablet (25 mg total) by mouth 2 (two) times daily. 02/18/24   Krishnan, Gokul, MD  mirtazapine  (REMERON ) 7.5 MG tablet Take 15 mg by mouth at bedtime.    [provider]  pantoprazole  (PROTONIX ) 40 MG tablet Take 1 tablet (40 mg total) by mouth 2 (two) times daily. 05/01/24 05/01/25  Adhikari, Amrit, MD  polyethylene glycol (MIRALAX  / GLYCOLAX ) 17 g packet Take 17 g by mouth daily. Patient taking differently: Take 17 g by mouth daily as needed. 02/19/24   Krishnan, Gokul, MD  potassium chloride  (MICRO-K ) 10 MEQ CR capsule Take 10 mEq by mouth 2 (two) times daily.    [provider]  Probiotic Product (PROBIOTIC PO) Take 1 capsule by mouth daily.    [provider]  Propylene Glycol (SYSTANE BALANCE) 0.6 % SOLN Place 1 drop into the left eye 3 (three) times daily as needed (dry/irritated eyes).    [provider]  repaglinide  (PRANDIN ) 1 MG tablet Take 1 mg by mouth 2 (two) times daily before a meal.    [provider]  telmisartan  (MICARDIS ) 40 MG tablet Take 40 mg by mouth daily. 03/27/24   [provider]  traZODone  (DESYREL ) 100 MG tablet Take 100 mg by mouth at bedtime. 03/12/24   [provider]  zinc  sulfate, 50mg  elemental zinc , 220 (50 Zn) MG capsule Take 1  capsule (220 mg total) by mouth daily for 14 days. 05/02/24 05/16/24  Jillian Buttery, MD    Allergies: Versed  [midazolam ], Bactrim  [sulfamethoxazole -trimethoprim ], Augmentin [amoxicillin-pot clavulanate], Demerol [meperidine], Meperidine hcl, Scopolamine, and Tramadol     Review of Systems  Updated Vital Signs BP (!) 131/59 (BP Location: Left Arm)   Pulse 95   Temp 97.8 F (36.6 C) (Oral)   Resp 13   SpO2 96%   Physical Exam  (all labs ordered are listed, but only abnormal results are displayed) Labs Reviewed  CBG MONITORING, ED - Abnormal; Notable for the following components:      Result Value   Glucose-Capillary 42 (*)    All other  components within normal limits  COMPREHENSIVE METABOLIC PANEL WITH GFR  CBC WITH DIFFERENTIAL/PLATELET  URINALYSIS, W/ REFLEX TO CULTURE (INFECTION SUSPECTED)    EKG: None  Radiology: No results found.   Procedures   Medications Ordered in the ED - No data to display                                  Medical Decision Making Presents with minimally symptomatic episode of hypoglycemia.  On arrival to the ED the patient is asymptomatic.  Patient observed for several hours and after eating and administration of glucose she feels much improved.  Patient desires discharge.  She appears to be appropriate for discharge.  Workup on the whole is without significant acute abnormality.  UA is suggestive of possible early infection.  However, patient is completely asymptomatic.  She is currently on doxycycline .  She is not inclined to take additional antibiotics at this time.  She request that she be sent home.  She reports that if she feels worse she will return to the ED for reevaluation.  Patient declines further workup and/or observation/admission.  Importance of close follow-up is stressed.  Strict return precautions given and understood.  Amount and/or Complexity of Data Reviewed Labs: ordered. Radiology: ordered.  Risk Prescription drug management.        Final diagnoses:  Hypoglycemia    ED Discharge Orders     None          Laurice Maude BROCKS, MD 05/12/24 1235

## 2024-05-12 NOTE — ED Notes (Signed)
 Pt declining fluids.EDP notified

## 2024-05-13 ENCOUNTER — Ambulatory Visit

## 2024-05-13 ENCOUNTER — Other Ambulatory Visit: Payer: Self-pay

## 2024-05-13 DIAGNOSIS — M6281 Muscle weakness (generalized): Secondary | ICD-10-CM | POA: Diagnosis not present

## 2024-05-13 DIAGNOSIS — R609 Edema, unspecified: Secondary | ICD-10-CM | POA: Diagnosis not present

## 2024-05-13 DIAGNOSIS — M4628 Osteomyelitis of vertebra, sacral and sacrococcygeal region: Secondary | ICD-10-CM | POA: Diagnosis not present

## 2024-05-13 DIAGNOSIS — E161 Other hypoglycemia: Secondary | ICD-10-CM | POA: Diagnosis not present

## 2024-05-13 DIAGNOSIS — L89156 Pressure-induced deep tissue damage of sacral region: Secondary | ICD-10-CM | POA: Diagnosis not present

## 2024-05-13 DIAGNOSIS — M6259 Muscle wasting and atrophy, not elsewhere classified, multiple sites: Secondary | ICD-10-CM | POA: Diagnosis not present

## 2024-05-14 LAB — URINE CULTURE: Culture: 60000 — AB

## 2024-05-15 ENCOUNTER — Telehealth (HOSPITAL_BASED_OUTPATIENT_CLINIC_OR_DEPARTMENT_OTHER): Payer: Self-pay | Admitting: *Deleted

## 2024-05-15 ENCOUNTER — Other Ambulatory Visit: Payer: Self-pay

## 2024-05-15 DIAGNOSIS — C23 Malignant neoplasm of gallbladder: Secondary | ICD-10-CM

## 2024-05-15 NOTE — Progress Notes (Signed)
 ED Antimicrobial Stewardship Positive Culture Follow Up   [name] is an 83 y.o. female who presented to [facility] on 05/12/2024 with a chief complaint of  Chief Complaint  Patient presents with   Hypoglycemia    Recent Results (from the past 720 hours)  MRSA Next Gen by PCR, Nasal     Status: None   Collection Time: 04/29/24  9:50 AM   Specimen: Nasal Mucosa; Nasal Swab  Result Value Ref Range Status   MRSA by PCR Next Gen NOT DETECTED NOT DETECTED Final    Comment: (NOTE) The GeneXpert MRSA Assay (FDA approved for NASAL specimens only), is one component of a comprehensive MRSA colonization surveillance program. It is not intended to diagnose MRSA infection nor to guide or monitor treatment for MRSA infections. Test performance is not FDA approved in patients less than 42 years old. Performed at [facility], [address] [place], [place] [address]   Urine Culture     Status: Abnormal   Collection Time: 05/12/24 11:13 AM   Specimen: Urine, Random  Result Value Ref Range Status   Specimen Description   Final    URINE, RANDOM Performed at [facility], [address], [place], [place] [address]    Special Requests   Final    NONE Reflexed from [id number] Performed at [facility], [address], [place], [place] [address]    Culture (A)  Final    60,000 COLONIES/mL STAPHYLOCOCCUS AUREUS METHICILLIN RESISTANT STAPHYLOCOCCUS AUREUS    Report Status 05/14/2024 FINAL  Final   Organism ID, Bacteria STAPHYLOCOCCUS AUREUS (A)  Final      Susceptibility   Staphylococcus aureus - MIC*    CIPROFLOXACIN  >=8 RESISTANT Resistant     GENTAMICIN <=0.5 SENSITIVE Sensitive     NITROFURANTOIN 32 SENSITIVE Sensitive     OXACILLIN >=4 RESISTANT Resistant     TETRACYCLINE >=16 RESISTANT Resistant     VANCOMYCIN  1 SENSITIVE Sensitive     TRIMETH /SULFA  <=10 SENSITIVE Sensitive     RIFAMPIN <=0.5 SENSITIVE Sensitive      Inducible Clindamycin NEGATIVE Sensitive     LINEZOLID 4 SENSITIVE Sensitive     * 60,000 COLONIES/mL STAPHYLOCOCCUS AUREUS   Pt is a 83yoF who presented to the ED for a hypoglycemic episode that was resolved with D10 and food. The patient reported no urinary sx and did not have a fever. UA was unremarkable. Urine culture resulted with 60K colonies of MRSA. No further antibiotic treatment needed since no urinary sx or systemic s/sx of infection. Called nurse at SNF who said she is feeling well.   ED Provider: Prentice Medicus, MD  Izetta Carl, PharmD PGY1 Pharmacy Resident [facility]  05/15/2024 9:55 AM

## 2024-05-15 NOTE — Telephone Encounter (Signed)
 Post ED Visit - Positive Culture Follow-up  Culture report reviewed by antimicrobial stewardship pharmacist: [facility] []  Rankin Dee, Pharm.D. []  Venetia Gully, Pharm.D., BCPS AQ-ID []  Garrel Crews, Pharm.D., BCPS []  Almarie Lunger, [address].D., BCPS []  [place], [address].D., BCPS, AAHIVP []  Rosaline Bihari, Pharm.D., BCPS, AAHIVP []  Vernell Meier, PharmD, BCPS []  Latanya Hint, PharmD, BCPS []  Donald Medley, PharmD, BCPS []  Rocky Bold, PharmD []  Dorothyann Alert, PharmD, BCPS []  Morene Babe, PharmD  [facility] [x]  Izetta Carl PharmD []  Romona Bliss, PharmD []  Dolphus Roller, PharmD []  Veva Seip, Rph []  Vernell Daunt) Leonce, PharmD []  Eva Allis, PharmD []  Rosaline Millet, PharmD []  Iantha Batch, PharmD []  Arvin Gauss, PharmD []  Wanda Hasting, PharmD []  Ronal Rav, PharmD []  Rocky Slade, PharmD []  Bard Jeans, PharmD   Positive urine culture reviewed by Prentice Medicus, MD No treatment required per patient had no systemic signs or symptoms and no further patient follow-up is required at this time.  [name] 05/15/2024, 1:04 PM

## 2024-05-18 ENCOUNTER — Other Ambulatory Visit: Payer: Self-pay

## 2024-05-18 ENCOUNTER — Inpatient Hospital Stay: Attending: Radiation Oncology | Admitting: Hematology

## 2024-05-18 ENCOUNTER — Inpatient Hospital Stay

## 2024-05-18 VITALS — BP 111/45 | HR 97 | Temp 97.4°F | Resp 16 | Ht 65.0 in | Wt 136.1 lb

## 2024-05-18 DIAGNOSIS — R609 Edema, unspecified: Secondary | ICD-10-CM

## 2024-05-18 DIAGNOSIS — D630 Anemia in neoplastic disease: Secondary | ICD-10-CM | POA: Insufficient documentation

## 2024-05-18 DIAGNOSIS — C23 Malignant neoplasm of gallbladder: Secondary | ICD-10-CM

## 2024-05-18 DIAGNOSIS — M6281 Muscle weakness (generalized): Secondary | ICD-10-CM | POA: Diagnosis not present

## 2024-05-18 DIAGNOSIS — M4628 Osteomyelitis of vertebra, sacral and sacrococcygeal region: Secondary | ICD-10-CM | POA: Diagnosis not present

## 2024-05-18 DIAGNOSIS — Z95828 Presence of other vascular implants and grafts: Secondary | ICD-10-CM

## 2024-05-18 DIAGNOSIS — L89156 Pressure-induced deep tissue damage of sacral region: Secondary | ICD-10-CM | POA: Diagnosis not present

## 2024-05-18 DIAGNOSIS — Z79899 Other long term (current) drug therapy: Secondary | ICD-10-CM | POA: Diagnosis not present

## 2024-05-18 DIAGNOSIS — C787 Secondary malignant neoplasm of liver and intrahepatic bile duct: Secondary | ICD-10-CM | POA: Insufficient documentation

## 2024-05-18 DIAGNOSIS — M6259 Muscle wasting and atrophy, not elsewhere classified, multiple sites: Secondary | ICD-10-CM | POA: Diagnosis not present

## 2024-05-18 LAB — CBC WITH DIFFERENTIAL (CANCER CENTER ONLY)
Abs Immature Granulocytes: 0.02 K/uL (ref 0.00–0.07)
Basophils Absolute: 0.1 K/uL (ref 0.0–0.1)
Basophils Relative: 1 %
Eosinophils Absolute: 0.1 K/uL (ref 0.0–0.5)
Eosinophils Relative: 1 %
HCT: 24.1 % — ABNORMAL LOW (ref 36.0–46.0)
Hemoglobin: 8 g/dL — ABNORMAL LOW (ref 12.0–15.0)
Immature Granulocytes: 0 %
Lymphocytes Relative: 5 %
Lymphs Abs: 0.5 K/uL — ABNORMAL LOW (ref 0.7–4.0)
MCH: 30.3 pg (ref 26.0–34.0)
MCHC: 33.2 g/dL (ref 30.0–36.0)
MCV: 91.3 fL (ref 80.0–100.0)
Monocytes Absolute: 1.1 K/uL — ABNORMAL HIGH (ref 0.1–1.0)
Monocytes Relative: 12 %
Neutro Abs: 7.7 K/uL (ref 1.7–7.7)
Neutrophils Relative %: 81 %
Platelet Count: 212 K/uL (ref 150–400)
RBC: 2.64 MIL/uL — ABNORMAL LOW (ref 3.87–5.11)
RDW: 18.6 % — ABNORMAL HIGH (ref 11.5–15.5)
WBC Count: 9.5 K/uL (ref 4.0–10.5)
nRBC: 0 % (ref 0.0–0.2)

## 2024-05-18 LAB — CMP (CANCER CENTER ONLY)
ALT: 44 U/L (ref 0–44)
AST: 53 U/L — ABNORMAL HIGH (ref 15–41)
Albumin: 2 g/dL — ABNORMAL LOW (ref 3.5–5.0)
Alkaline Phosphatase: 347 U/L — ABNORMAL HIGH (ref 38–126)
Anion gap: 7 (ref 5–15)
BUN: 35 mg/dL — ABNORMAL HIGH (ref 8–23)
CO2: 26 mmol/L (ref 22–32)
Calcium: 7.9 mg/dL — ABNORMAL LOW (ref 8.9–10.3)
Chloride: 106 mmol/L (ref 98–111)
Creatinine: 1.94 mg/dL — ABNORMAL HIGH (ref 0.44–1.00)
GFR, Estimated: 25 mL/min — ABNORMAL LOW (ref >60)
Glucose, Bld: 172 mg/dL — ABNORMAL HIGH (ref 70–99)
Potassium: 4.1 mmol/L (ref 3.5–5.1)
Sodium: 139 mmol/L (ref 135–145)
Total Bilirubin: 0.4 mg/dL (ref 0.0–1.2)
Total Protein: 4.2 g/dL — ABNORMAL LOW (ref 6.5–8.1)

## 2024-05-18 LAB — FERRITIN: Ferritin: 256 ng/mL (ref 11–307)

## 2024-05-18 LAB — VITAMIN B12: Vitamin B-12: 511 pg/mL (ref 180–914)

## 2024-05-18 MED ORDER — SODIUM CHLORIDE 0.9% FLUSH
10.0000 mL | Freq: Once | INTRAVENOUS | Status: AC | PRN
  Administered 2024-05-18: 10 mL

## 2024-05-18 NOTE — Progress Notes (Signed)
 Verbal order w/readback from Dr. Lanny for US  Doppler of RUE d/t 4+ Pitting Edema and PICC currently in place in arm STAT.  Order placed and scheduled for 05/19/2024 @12 :30pm.  Pt and SNF aware.  Appt entered on SNF order sheet and signed by Dr. Lanny.

## 2024-05-18 NOTE — Assessment & Plan Note (Signed)
 cT4N0M0, stage III -Patient had 1 episode of abdominal pain in October 2023, and was found to have a large gallbladder stone. -She underwent laparoscopic attempted cholecystectomy but was not able to be resected due to adhesions to duodenum and colon. -She developed jaundice in June 2024, status post ERCP and stent placement in [place].  CT and MRI scan showed a liver mass adjacent to the gallbladder fossae, and a biopsy confirmed adenocarcinoma.  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  Unfortunately this was felt to be not resectable by Dr. Dasie. -I reviewed her CT chest from April 22, 2023, which showed no evidence of distant metastasis. -I recommended first-line cisplatin , gemcitabine   every 14 days and durvalumab  every 28 days.  If she responded well, will stop chemo after 4 to 6 months of treatment, and changed to maintenance therapy. She started on 05/03/23 every 2 weeks and has been tolerating well.  Chemo changed to maintenance durvalumab  on 11/22/2023 -restaging CT 07/08/2023 showed partial response in the primary site tumor with direct invasion to liver.  No new disease or question.   -case reviewed in GI tumor board, consolidation RT is also an option. She started RT and Xeloda  on 12/09/2023 and completed on 01/15/2024 -plan to continue durvalumab , treatment held after April 2025 due to multiple hospital admissions and deconditioning

## 2024-05-19 ENCOUNTER — Encounter: Payer: Self-pay | Admitting: Pulmonary Disease

## 2024-05-19 ENCOUNTER — Encounter: Payer: Self-pay | Admitting: Hematology

## 2024-05-19 ENCOUNTER — Ambulatory Visit (HOSPITAL_COMMUNITY)
Admission: RE | Admit: 2024-05-19 | Discharge: 2024-05-19 | Disposition: A | Discharge status: Home / Self Care | Source: Ambulatory Visit | Attending: Hematology | Admitting: Hematology

## 2024-05-19 DIAGNOSIS — R609 Edema, unspecified: Secondary | ICD-10-CM | POA: Diagnosis not present

## 2024-05-19 DIAGNOSIS — L89154 Pressure ulcer of sacral region, stage 4: Secondary | ICD-10-CM | POA: Diagnosis not present

## 2024-05-19 DIAGNOSIS — C23 Malignant neoplasm of gallbladder: Secondary | ICD-10-CM | POA: Diagnosis not present

## 2024-05-19 LAB — CANCER ANTIGEN 19-9: CA 19-9: 4239 U/mL — ABNORMAL HIGH (ref 0–35)

## 2024-05-19 NOTE — Progress Notes (Signed)
 [facility]   [phone] [phone]   Clinic Follow up Note   Patient Care Team: Chrystal Lamarr RAMAN, MD as PCP - General (Family Medicine) Court Dorn PARAS, MD as PCP - Cardiology (Cardiology) Lanny Callander, MD as Consulting Physician (Oncology)  Date of Service:  05/18/2024  CHIEF COMPLAINT: f/u of gallbladder cancer  CURRENT THERAPY:  Maintenance durvalumab   Oncology History   Gallbladder cancer (HCC) cT4N0M0, stage III -Patient had 1 episode of abdominal pain in October 2023, and was found to have a large gallbladder stone. -She underwent laparoscopic attempted cholecystectomy but was not able to be resected due to adhesions to duodenum and colon. -She developed jaundice in June 2024, status post ERCP and stent placement in CBD.  CT and MRI scan showed a liver mass adjacent to the gallbladder fossae, and a biopsy confirmed adenocarcinoma.  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  Unfortunately this was felt to be not resectable by Dr. Dasie. -I reviewed her CT chest from April 22, 2023, which showed no evidence of distant metastasis. -I recommended first-line cisplatin , gemcitabine   every 14 days and durvalumab  every 28 days.  If she responded well, will stop chemo after 4 to 6 months of treatment, and changed to maintenance therapy. She started on 05/03/23 every 2 weeks and has been tolerating well.  Chemo changed to maintenance durvalumab  on 11/22/2023 -restaging CT 07/08/2023 showed partial response in the primary site tumor with direct invasion to liver.  No new disease or question.   -case reviewed in GI tumor board, consolidation RT is also an option. 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 Ascites present, causing bloating but no pain. Immunotherapy is considered feasible as chemotherapy and radiation are not options due to her weakened state. She is willing to undergo immunotherapy every four weeks, and the nursing home will provide transportation for the infusions. - Schedule immunotherapy infusion for the second week of September, every four weeks. - Monitor cancer antigen levels.  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 She is scheduled for iron  infusion this week and next week. Blood transfusion is considered if hemoglobin drops further. She prefers blood transfusion over the risk of stroke, as she experienced a severe hypoglycemic episode recently. - Administer iron  infusion this week and next week. - Evaluate response to iron  infusion before deciding on blood transfusion.  Edema involving upper and lower extremities, breast, and face Edema present in multiple areas including upper and lower extremities, breast, and face. Possible causes include low protein levels and fluid leakage. Left-sided swelling may be exacerbated by sleeping position. Right hand swelling noted since antibody infusion, possibly related to PICC line. - Perform Doppler ultrasound to check for blood clots around the PICC line.  Pressure ulcer with wound vac Pressure ulcer on the backside being managed with a wound vac.  PICC line management PICC line in place for blood draws due to difficult venous access. Potential concern for blood clots contributing to right hand swelling. - Perform Doppler ultrasound  to check for blood clots around the PICC line. - Remove PICC line if blood clots are detected.  Reduced mobility requiring  assistive device and ongoing rehabilitation Reduced mobility with ongoing physical therapy. Graduated from occupational therapy. Uses a rollator for walking and is attempting to use a cane but remains unsteady. Working on strengthening exercises, particularly for the legs and buttocks. - Continue physical therapy. - Encourage participation in rehabilitation activities to improve strength and mobility.  Type 2 diabetes mellitus Type 2 diabetes mellitus with a recent episode of hypoglycemia (blood sugar at 42) leading to hospitalization.  Plan - Will order Doppler of right upper extremity to rule out DVT due to her significant hand edema - I encouraged her to continue physical therapy, high-protein diet - Plan to restart durvalumab  in 2 weeks   SUMMARY OF ONCOLOGIC HISTORY: Oncology History Overview Note   Cancer Staging  Gallbladder cancer [facility]) Staging form: Distal Bile Duct, AJCC 8th Edition - Clinical stage from 04/09/2023: Stage IIIB (cT4, cN0, cM0) - Signed by Lanny Callander, MD on 04/18/2023 Total positive nodes: 0 Histologic grade (G): G2 Histologic grading system: 3 grade system     Gallbladder cancer (HCC)  04/04/2023 Imaging    IMPRESSION: 1. Ill-defined hypodensity in the region of the gallbladder fossa appears similar to the prior study given lack of contrast. This would be better evaluated with dedicated MRI. 2. Nonobstructing right renal calculus. 3. Large amount of stool throughout the colon. 4. Stable indeterminate lucent areas in the vertebral bodies of the lumbar spine. 5. Severe atherosclerotic disease.     04/09/2023 Pathology Results     FINAL MICROSCOPIC DIAGNOSIS:   A. LIVER, ADJACENT TO GALLBLADDER FOSSA, BIOPSY:  - Moderately differentiated adenocarcinoma    04/09/2023 Cancer Staging   Staging form: Distal Bile Duct, AJCC 8th Edition - Clinical stage from 04/09/2023: Stage IIIB (cT4, cN0, cM0) - Signed by Lanny Callander, MD on 04/18/2023 Total positive nodes:  0 Histologic grade (G): G2 Histologic grading system: 3 grade system   04/18/2023 Initial Diagnosis   Cholangiocarcinoma of liver (HCC)   05/03/2023 - 11/08/2023 Chemotherapy   Patient is on Treatment Plan : BILIARY TRACT Cisplatin  + Gemcitabine  D1,15 + Durvalumab  (1500) D1 q28d / Durvalumab  (1500) q28d     07/08/2023 Imaging   CT Chest abdomen and pelvis with contrast  IMPRESSION: CT CHEST IMPRESSION  1.  No acute process or evidence of metastatic disease in the chest. 2. Left Port-A-Cath tip in azygous vein. This is being adjusted by interventional radiology later today. 3. Ongoing stability of tiny bilateral pulmonary nodules, favored to be benign. 4.  Aortic Atherosclerosis (ICD10-I70.0).  CT ABDOMEN AND PELVIS IMPRESSION  1. The segment 5 liver lesion is decreased in size compared to the MRI of 04/05/2023. 2. Placement of a biliary stent with improvement, near complete resolution of duct dilatation. 3. No abdominopelvic adenopathy. 4. Proximal gastric underdistention. Apparent wall thickening could be secondary. Correlate with symptoms of gastritis 5. Subtle findings within the superior spleen for which small volume interval splenic infarct cannot be excluded. Correlate with left upper quadrant symptoms. 6.  Possible constipation.     09/27/2023 Imaging   CT chest abdomen and pelvis with contrast  IMPRESSION: CHEST:  1. Stable small pulmonary nodules. 2. No evidence of thoracic metastasis.  PELVIS:  1. Stable hepatic metastasis. 2. No new hepatic lesions. 3. No evidence of metastatic adenopathy in the abdomen pelvis. 4. Intrahepatic and extrahepatic biliary stent in place. No biliary duct dilatation. 5.  Aortic Atherosclerosis (ICD10-I70.0).   11/22/2023 -  Chemotherapy   Patient is on Treatment Plan : LUNG NSCLC Durvalumab  (1500) q28d        Discussed the use of AI scribe software for clinical note transcription with the patient, who gave verbal consent to  proceed.  History of Present Illness [name] is an 83 year old female with gallbladder cancer who presents for follow-up.  She has a PICC line due to difficult venous access and is unsure of her current blood counts, though she recalls a hemoglobin level of 8 last week. She is curious about her potassium levels.  She experiences swelling in her left hand and leg, with the left hand more swollen than the right. Swelling is also noted in her left eye, breast, and right hand. She is concerned about potential blood clots around the PICC line, as her right hand has been swollen since an infusion six weeks ago.  Her skin is very thin, resulting in skin tears during a recent hospital stay. A wound vac is in place on her backside.  She experienced hypoglycemia last week, with a blood sugar level of 42, leading to hospitalization. She is on a reduced dose of Xarelto  and has low hemoglobin levels, with no identified source of bleeding. Occasional diarrhea is present, but no bleeding is reported. She feels bloated.     All other systems were reviewed with the patient and are negative.  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 There is a  small calcified mass in the L:A near the LA appendage. No valvular masses seen with associated mitral annular calcification. LA Volume/ BSA27.4 ml/m2 No AS. Right ventricular systolic pressure is elevated at .   Jaundice    Myocardial infarction [facility])    June 2021   Neuromuscular disorder (HCC)    neuropathy in bilateral feet   Peripheral vascular disease (HCC)    Pneumonia    not hosp.    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  PTCAs with drug coated balloon Lt ant tibial artery and Lt popliteal artery  11/26/2019    SURGICAL HISTORY: Past Surgical History:  Procedure Laterality Date   ABDOMINAL AORTAGRAM  11/25/2019   ABDOMINAL AORTOGRAM W/LOWER EXTREMITY    ABDOMINAL AORTOGRAM N/A 06/15/2019   Procedure: ABDOMINAL AORTOGRAM;  Surgeon: Court Dorn PARAS, MD;  [facility];  Service: Cardiovascular;  Laterality: N/A;   ABDOMINAL AORTOGRAM W/LOWER EXTREMITY N/A 11/25/2019   Procedure: ABDOMINAL AORTOGRAM W/LOWER EXTREMITY;  Surgeon: Darron Deatrice LABOR, MD;  [facility]: [facility];  Service: Cardiovascular;  Laterality: N/A;  Lt leg   ABDOMINAL AORTOGRAM W/LOWER EXTREMITY N/A 08/01/2020   Procedure: ABDOMINAL AORTOGRAM W/LOWER EXTREMITY;  Surgeon: Court Dorn PARAS, MD;  [facility];  Service: Cardiovascular;  Laterality: N/A;   ABDOMINAL AORTOGRAM W/LOWER EXTREMITY N/A 04/28/2021   Procedure: ABDOMINAL AORTOGRAM W/LOWER EXTREMITY;  Surgeon: Harvey Carlin BRAVO, MD;  [facility];  Service: Cardiovascular;  Laterality: N/A;   ABDOMINAL AORTOGRAM W/LOWER EXTREMITY N/A 10/13/2021   Procedure: ABDOMINAL AORTOGRAM W/LOWER EXTREMITY;  Surgeon: Magda Debby SAILOR, MD;  [facility];  Service: Cardiovascular;  Laterality: N/A;   ABDOMINAL AORTOGRAM W/LOWER EXTREMITY N/A 11/09/2022   Procedure: ABDOMINAL AORTOGRAM W/LOWER EXTREMITY;  Surgeon: Magda Debby SAILOR, MD;  [facility];  Service:  Cardiovascular;  Laterality: N/A;   ABDOMINAL AORTOGRAM W/LOWER EXTREMITY N/A 11/16/2022   Procedure: ABDOMINAL AORTOGRAM W/LOWER EXTREMITY;  Surgeon: Magda Debby SAILOR, MD;  [facility];  Service: Cardiovascular;  Laterality: N/A;   AMPUTATION TOE Left 05/03/2021   Procedure: AMPUTATION OF THIRD LEFT  TOE;  Surgeon: Magda Debby SAILOR, MD;  Location: [facility] OR;  Service: Vascular;  Laterality: Left;   APPENDECTOMY     APPLICATION OF WOUND VAC Right 07/21/2019   Procedure: Application Of Prevena Wound Vac Right Groin;  Surgeon: Serene Gaile ORN, MD;  Location: [facility] OR;  Service: Vascular;  Laterality: Right;   BILIARY BRUSHING  04/07/2023   Procedure: BILIARY BRUSHING;  Surgeon: Rosalie Kitchens, MD;  Location: [facility] ENDOSCOPY;  Service: Gastroenterology;;   BILIARY STENT PLACEMENT  04/07/2023   Procedure: BILIARY STENT PLACEMENT;  Surgeon: Rosalie Kitchens, MD;  Location: [facility] ENDOSCOPY;  Service: Gastroenterology;;   CARPAL TUNNEL RELEASE Left    CARPAL TUNNEL RELEASE Right    CESAREAN SECTION     x 2   CHOLECYSTECTOMY N/A 10/12/2022   Procedure: LAPAROSCOPIC PARTIAL FENESTRATING CHOLECYSTECTOMY;  Surgeon: Dasie Leonor CROME, MD;  [facility];  Service: General;  Laterality: N/A;   COLONOSCOPY     CORONARY ARTERY BYPASS GRAFT N/A 03/24/2020   Procedure: CORONARY ARTERY BYPASS GRAFTING (CABG) using LIMA to LAD (m); RIMA to RAMUS; Endoscopic Right Greater Saphenous Vein: SVG to Diag1; SVG to PLB (right); and SVG to PL (left).;  Surgeon: German Bartlett PEDLAR, MD;  [facility] OR;  Service: Open Heart Surgery;  Laterality: N/A;  BILATERAL IMA   ENDARTERECTOMY FEMORAL Right 07/21/2019   Procedure: RIGHT ENDARTERECTOMY FEMORAL WITH PATCH ANGIOPLASTY;  Surgeon: Serene Gaile ORN, MD;  [facility];  Service: Vascular;  Laterality: Right;   ENDARTERECTOMY FEMORAL Right 08/16/2020   Procedure: RIGHT FEMORAL ENDARTERECTOMY;  Surgeon: Sheree Penne Bruckner, MD;  Location: [facility] OR;  Service: Vascular;   Laterality: Right;   ENDOVEIN HARVEST OF GREATER SAPHENOUS VEIN Right 03/24/2020   Procedure: ENDOVEIN HARVEST OF GREATER SAPHENOUS VEIN;  Surgeon: German Bartlett PEDLAR, MD;  [facility];  Service: Open Heart Surgery;  Laterality: Right;   ERCP N/A 04/07/2023   Procedure: ENDOSCOPIC RETROGRADE CHOLANGIOPANCREATOGRAPHY (ERCP);  Surgeon: Rosalie Kitchens, MD;  Location: [facility] ENDOSCOPY;  Service: Gastroenterology;  Laterality: N/A;   EYE SURGERY     cataract removal bilaterally   FEMORAL-POPLITEAL BYPASS GRAFT Right 08/16/2020   Procedure: BYPASS GRAFT FEMORAL-POPLITEAL ARTERY;  Surgeon: Sheree Penne Bruckner, MD;  Location: [facility] OR;  Service: Vascular;  Laterality: Right;   FEMORAL-POPLITEAL BYPASS GRAFT Left 05/03/2021   Procedure: LEFT FEMORAL TO BELOW KNEE POPLITEAL ARTERY BYPASS GRAFTING WITH 6MMX80 PTFE GRAFT;  Surgeon: Magda Debby SAILOR, MD;  [facility];  Service: Vascular;  Laterality: Left;   LEFT HEART CATH AND CORONARY ANGIOGRAPHY N/A 03/22/2020   Procedure: LEFT HEART CATH AND CORONARY ANGIOGRAPHY;  Surgeon: [place], Peter M, MD;  Location: [facility] INVASIVE CV LAB;  Service: Cardiovascular;  Laterality: N/A;   LOWER EXTREMITY ANGIOGRAPHY Bilateral 06/15/2019   Procedure: Lower Extremity Angiography;  Surgeon: Court Dorn PARAS, MD;  Location: [facility] INVASIVE CV LAB;  Service: Cardiovascular;  Laterality: Bilateral;   LOWER EXTREMITY ANGIOGRAPHY Right 08/03/2019   Procedure: LOWER EXTREMITY ANGIOGRAPHY;  Surgeon: Court Dorn PARAS, MD;  [facility];  Service: Cardiovascular;  Laterality: Right;   LOWER EXTREMITY ANGIOGRAPHY N/A 09/07/2021   Procedure: LOWER EXTREMITY ANGIOGRAPHY;  Surgeon: Gretta Lonni PARAS, MD;  [facility];  Service: Cardiovascular;  Laterality: N/A;   PATCH ANGIOPLASTY Right 07/21/2019   Procedure: Patch Angioplasty Right Femoral Artery;  Surgeon: Serene Gaile ORN, MD;  Location: [facility] OR;  Service: Vascular;  Laterality: Right;   PERIPHERAL  INTRAVASCULAR LITHOTRIPSY  11/25/2019   Procedure: INTRAVASCULAR LITHOTRIPSY;  Surgeon: Darron Deatrice LABOR, MD;  [facility];  Service: Cardiovascular;;  LT. SFA   PERIPHERAL VASCULAR ATHERECTOMY  08/03/2019   Procedure: PERIPHERAL VASCULAR ATHERECTOMY;  Surgeon: Court Dorn PARAS, MD;  Location: [facility] INVASIVE CV LAB;  Service: Cardiovascular;;  right SFA, right TP trunk   PERIPHERAL VASCULAR ATHERECTOMY  11/25/2019   Procedure: PERIPHERAL VASCULAR ATHERECTOMY;  Surgeon: Darron Deatrice LABOR, MD;  [facility];  Service: Cardiovascular;;  Lt.  POPLITEAL and AT   PERIPHERAL VASCULAR BALLOON ANGIOPLASTY Left 06/15/2019   Procedure: PERIPHERAL VASCULAR BALLOON ANGIOPLASTY;  Surgeon: Court Dorn PARAS, MD;  [facility];  Service: Cardiovascular;  Laterality: Left;  SFA UNSUCCESSFUL UNABLE TO CROSS LESION   PERIPHERAL VASCULAR BALLOON ANGIOPLASTY  08/03/2019   Procedure: PERIPHERAL VASCULAR BALLOON ANGIOPLASTY;  Surgeon: Court Dorn PARAS, MD;  [facility];  Service: Cardiovascular;;  right SFA, Right TP trunk   PERIPHERAL VASCULAR BALLOON ANGIOPLASTY  09/07/2021   Procedure: PERIPHERAL VASCULAR BALLOON ANGIOPLASTY;  Surgeon: Gretta Lonni PARAS, MD;  [facility];  Service: Cardiovascular;;   PERIPHERAL VASCULAR BALLOON ANGIOPLASTY Right 10/13/2021   Procedure: PERIPHERAL VASCULAR BALLOON ANGIOPLASTY;  Surgeon: Magda Debby SAILOR, MD;  [facility];  Service: Cardiovascular;  Laterality: Right;   PERIPHERAL VASCULAR BALLOON ANGIOPLASTY  11/09/2022   Procedure: PERIPHERAL VASCULAR BALLOON ANGIOPLASTY;  Surgeon: Magda Debby SAILOR, MD;  [facility];  Service: Cardiovascular;;  fem-pop bypass and AT   PORTACATH PLACEMENT Left 04/29/2023   Procedure: INSERTION PORT-A-CATH;  Surgeon: Dasie Leonor CROME, MD;  Location: [facility] ORS;  Service: General;  Laterality: Left;   PORTACATH PLACEMENT N/A 07/12/2023    Procedure: REVISION PORT-A-CATH;  Surgeon: Dasie Leonor CROME, MD;  Location: [facility] OR;  Service: General;  Laterality: N/A;   REMOVAL OF STONES  04/07/2023   Procedure: REMOVAL OF STONES;  Surgeon: Rosalie Kitchens, MD;  Location: [facility] ENDOSCOPY;  Service: Gastroenterology;;   ANNETT  04/07/2023   Procedure: ANNETT;  Surgeon: Rosalie Kitchens, MD;  Location: [facility] ENDOSCOPY;  Service: Gastroenterology;;   TEE WITHOUT CARDIOVERSION N/A 03/24/2020   Procedure: TRANSESOPHAGEAL ECHOCARDIOGRAM (TEE);  Surgeon: German Bartlett PEDLAR, MD;  Location: [facility] OR;  Service: Open Heart Surgery;  Laterality: N/A;   TONSILLECTOMY     and adenoidectomy   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/2019   Procedure: TRANSMETATARSAL AMPUTATION;  Surgeon: Gershon Donnice SAUNDERS, DPM;  [facility] OR;  Service: Podiatry;  Laterality: Right;   TRANSMETATARSAL AMPUTATION Left 05/11/2021   Procedure: LEFT TRANSMETATARSAL AMPUTATION;  Surgeon: Sheree Penne Bruckner, MD;  Location: [facility] OR;  Service: Vascular;  Laterality: Left;   TUBAL LIGATION      I have reviewed the social history and family history with the patient and they are unchanged from previous note.  ALLERGIES:  is allergic to versed  [midazolam ], bactrim  [sulfamethoxazole -trimethoprim ], augmentin [amoxicillin-pot clavulanate], demerol [meperidine], meperidine hcl, scopolamine, and tramadol .  MEDICATIONS:  Current Outpatient Medications  Medication Sig Dispense Refill   acetaminophen  (TYLENOL ) 500 MG tablet Take 1,000 mg by mouth every 6 (six) hours as needed for moderate pain (pain score 4-6), fever or mild pain (pain score 1-3).     brimonidine  (ALPHAGAN ) 0.2 % ophthalmic solution Place 1 drop into the right eye 2 (two) times daily.     doxycycline  (VIBRA -TABS) 100 MG tablet Take 1 tablet (100 mg total) by mouth every 12 (twelve) hours.     feeding supplement (ENSURE ENLIVE / ENSURE PLUS) LIQD Take 237 mLs by mouth 2 (two) times daily between meals.     ferrous sulfate   325 (65 FE) MG EC tablet Take 1 tablet (325 mg total) by mouth daily before breakfast. To be taken with a cup of orange juice on an otherwise empty stomach if possible     HUMALOG  MIX 75/25 KWIKPEN (75-25) 100 UNIT/ML KwikPen Inject 6 Units into the skin daily.     hydrochlorothiazide  (HYDRODIURIL ) 12.5 MG tablet Take 12.5 mg by mouth daily.     JARDIANCE  25 MG TABS tablet Take 25 mg by mouth daily.     magnesium  oxide (MAG-OX) 400 (240 Mg) MG tablet Take 400 mg by mouth 2 (two) times daily.     meropenem  1 g in sodium chloride  0.9 % 100 mL Inject 1 g into the vein every 12 (twelve) hours.     Metformin  HCl 500 MG/5ML SOLN Take 10 mLs by mouth in the morning and at bedtime.     metoprolol  tartrate (LOPRESSOR ) 25 MG tablet Take 1 tablet (25 mg total) by mouth 2 (two) times daily.     mirtazapine  (REMERON ) 7.5 MG tablet Take 15 mg by mouth at bedtime.     pantoprazole  (PROTONIX ) 40 MG tablet Take 1 tablet (40 mg total) by mouth 2 (two) times daily.     polyethylene glycol (MIRALAX  / GLYCOLAX ) 17 g packet Take 17 g by mouth daily. (Patient taking differently: Take 17 g by mouth daily as needed.)     potassium chloride  (MICRO-K ) 10 MEQ CR capsule Take 10 mEq by mouth 2 (two) times daily.     Probiotic Product (PROBIOTIC PO) Take 1 capsule by mouth daily.     Propylene Glycol (SYSTANE BALANCE) 0.6 % SOLN Place 1 drop into the left eye 3 (three) times daily as needed (dry/irritated eyes).     repaglinide  (PRANDIN ) 1 MG tablet Take 1 mg by mouth 2 (two) times daily before a meal.     telmisartan  (MICARDIS ) 40 MG tablet Take 40 mg by mouth daily.     traZODone  (DESYREL ) 100 MG tablet Take 100 mg by mouth at bedtime.     No current facility-administered medications for this visit.    PHYSICAL EXAMINATION: ECOG PERFORMANCE STATUS: 3 - Symptomatic, >50% confined to bed  Vitals:   05/18/24 1026  BP: (!) 111/45  Pulse: 97  Resp: 16  Temp: (!) 97.4 F (36.3 C)  SpO2: 99%  Wt Readings from Last 3  Encounters:  05/18/24 136 lb 1.6 oz (61.7 kg)  04/29/24 106 lb (48.1 kg)  04/14/24 106 lb 0.7 oz (48.1 kg)     GENERAL:alert, no distress and comfortable SKIN: skin color, texture, turgor are normal, mild ecchymosis in upper and low extremities, significant edema in right hand and bilateral lower extremity up to knee EYES: normal, Conjunctiva are pink and non-injected, sclera clear NECK: supple, thyroid  normal size, non-tender, without nodularity LYMPH:  no palpable lymphadenopathy in the cervical, axillary  LUNGS: clear to auscultation and percussion with normal breathing effort HEART: regular rate & rhythm and no murmurs and no lower extremity edema ABDOMEN:abdomen soft, non-tender and normal bowel sounds Musculoskeletal:no cyanosis of digits and no clubbing  NEURO: alert & oriented x 3 with fluent speech, no focal motor/sensory deficits  Physical Exam   LABORATORY DATA:  I have reviewed the data as listed    Latest Ref Rng & Units 05/18/2024    8:41 AM 05/12/2024    7:45 AM 05/01/2024    3:23 AM  CBC  WBC 4.0 - 10.5 K/uL 9.5  12.0  8.1   Hemoglobin 12.0 - 15.0 g/dL 8.0  9.6  7.4   Hematocrit 36.0 - 46.0 % 24.1  30.9  23.7   Platelets 150 - 400 K/uL 212  162  123         Latest Ref Rng & Units 05/18/2024    8:41 AM 05/12/2024    7:45 AM 05/01/2024    3:23 AM  CMP  Glucose 70 - 99 mg/dL 827  838  835   BUN 8 - 23 mg/dL 35  32  30   Creatinine 0.44 - 1.00 mg/dL 8.05  8.26  8.64   Sodium 135 - 145 mmol/L 139  136  135   Potassium 3.5 - 5.1 mmol/L 4.1  4.6  4.7   Chloride 98 - 111 mmol/L 106  104  108   CO2 22 - 32 mmol/L 26  21  21    Calcium  8.9 - 10.3 mg/dL 7.9  8.6  8.0   Total Protein 6.5 - 8.1 g/dL 4.2  4.5    Total Bilirubin 0.0 - 1.2 mg/dL 0.4  0.6    Alkaline Phos 38 - 126 U/L 347  488    AST 15 - 41 U/L 53  179    ALT 0 - 44 U/L 44  86        RADIOGRAPHIC STUDIES: I have personally reviewed the radiological images as listed and agreed with the findings in the  report. No results found.    No orders of the defined types were placed in this encounter.  All questions were answered. The patient knows to call the clinic with any problems, questions or concerns. No barriers to learning was detected. The total time spent in the appointment was 40 minutes, including review of chart and various tests results, discussions about plan of care and coordination of care plan     Onita Mattock, MD 05/19/2024

## 2024-05-20 ENCOUNTER — Ambulatory Visit (INDEPENDENT_AMBULATORY_CARE_PROVIDER_SITE_OTHER)

## 2024-05-20 VITALS — BP 122/54 | HR 88 | Temp 97.5°F | Resp 16 | Ht 65.0 in | Wt 136.0 lb

## 2024-05-20 DIAGNOSIS — M4628 Osteomyelitis of vertebra, sacral and sacrococcygeal region: Secondary | ICD-10-CM | POA: Diagnosis not present

## 2024-05-20 DIAGNOSIS — M6259 Muscle wasting and atrophy, not elsewhere classified, multiple sites: Secondary | ICD-10-CM | POA: Diagnosis not present

## 2024-05-20 DIAGNOSIS — M6281 Muscle weakness (generalized): Secondary | ICD-10-CM | POA: Diagnosis not present

## 2024-05-20 DIAGNOSIS — D649 Anemia, unspecified: Secondary | ICD-10-CM | POA: Diagnosis not present

## 2024-05-20 DIAGNOSIS — L89156 Pressure-induced deep tissue damage of sacral region: Secondary | ICD-10-CM | POA: Diagnosis not present

## 2024-05-20 DIAGNOSIS — R609 Edema, unspecified: Secondary | ICD-10-CM | POA: Diagnosis not present

## 2024-05-20 DIAGNOSIS — D509 Iron deficiency anemia, unspecified: Secondary | ICD-10-CM

## 2024-05-20 MED ORDER — HEPARIN SOD (PORK) LOCK FLUSH 100 UNIT/ML IV SOLN
250.0000 [IU] | Freq: Once | INTRAVENOUS | Status: AC | PRN
  Administered 2024-05-20: 250 [IU]
  Filled 2024-05-20: qty 5

## 2024-05-20 MED ORDER — SODIUM CHLORIDE 0.9 % IV SOLN
510.0000 mg | Freq: Once | INTRAVENOUS | Status: AC
  Administered 2024-05-20: 510 mg via INTRAVENOUS
  Filled 2024-05-20: qty 17

## 2024-05-20 NOTE — Progress Notes (Signed)
 Diagnosis: Iron  Deficiency Anemia  Provider:  Praveen Mannam MD  Procedure: IV Infusion  IV Type: PICC, IV Location: R Upper Arm  Feraheme  (Ferumoxytol ), Dose: 510 mg  Infusion Start Time: 1440  Infusion Stop Time: 1500  Post Infusion IV Care: Observation period completed and PICC Line Flushed/Capped  Discharge: Condition: Good, Destination: Home . AVS Provided  Performed by:  Leita FORBES Miles, LPN

## 2024-05-21 DIAGNOSIS — R600 Localized edema: Secondary | ICD-10-CM | POA: Diagnosis not present

## 2024-05-21 DIAGNOSIS — N183 Chronic kidney disease, stage 3 unspecified: Secondary | ICD-10-CM | POA: Diagnosis not present

## 2024-05-21 DIAGNOSIS — L89154 Pressure ulcer of sacral region, stage 4: Secondary | ICD-10-CM | POA: Diagnosis not present

## 2024-05-21 DIAGNOSIS — Z9289 Personal history of other medical treatment: Secondary | ICD-10-CM | POA: Diagnosis not present

## 2024-05-21 DIAGNOSIS — Z452 Encounter for adjustment and management of vascular access device: Secondary | ICD-10-CM | POA: Diagnosis not present

## 2024-05-21 DIAGNOSIS — E1122 Type 2 diabetes mellitus with diabetic chronic kidney disease: Secondary | ICD-10-CM | POA: Diagnosis not present

## 2024-05-21 DIAGNOSIS — D631 Anemia in chronic kidney disease: Secondary | ICD-10-CM | POA: Diagnosis not present

## 2024-05-22 ENCOUNTER — Other Ambulatory Visit: Payer: Self-pay

## 2024-05-22 ENCOUNTER — Observation Stay (HOSPITAL_COMMUNITY)
Admission: EM | Admit: 2024-05-22 | Discharge: 2024-05-23 | Disposition: A | Discharge status: Skilled Nursing Facility | Attending: Internal Medicine | Admitting: Internal Medicine

## 2024-05-22 DIAGNOSIS — N184 Chronic kidney disease, stage 4 (severe): Secondary | ICD-10-CM | POA: Diagnosis not present

## 2024-05-22 DIAGNOSIS — N179 Acute kidney failure, unspecified: Secondary | ICD-10-CM | POA: Diagnosis not present

## 2024-05-22 DIAGNOSIS — C23 Malignant neoplasm of gallbladder: Secondary | ICD-10-CM | POA: Diagnosis not present

## 2024-05-22 DIAGNOSIS — L89154 Pressure ulcer of sacral region, stage 4: Secondary | ICD-10-CM | POA: Diagnosis not present

## 2024-05-22 DIAGNOSIS — R600 Localized edema: Secondary | ICD-10-CM | POA: Diagnosis not present

## 2024-05-22 DIAGNOSIS — D500 Iron deficiency anemia secondary to blood loss (chronic): Secondary | ICD-10-CM | POA: Diagnosis not present

## 2024-05-22 DIAGNOSIS — Z79899 Other long term (current) drug therapy: Secondary | ICD-10-CM | POA: Diagnosis not present

## 2024-05-22 DIAGNOSIS — Z9861 Coronary angioplasty status: Secondary | ICD-10-CM | POA: Insufficient documentation

## 2024-05-22 DIAGNOSIS — Z7984 Long term (current) use of oral hypoglycemic drugs: Secondary | ICD-10-CM | POA: Diagnosis not present

## 2024-05-22 DIAGNOSIS — Z872 Personal history of diseases of the skin and subcutaneous tissue: Secondary | ICD-10-CM | POA: Diagnosis not present

## 2024-05-22 DIAGNOSIS — Z8522 Personal history of malignant neoplasm of nasal cavities, middle ear, and accessory sinuses: Secondary | ICD-10-CM | POA: Insufficient documentation

## 2024-05-22 DIAGNOSIS — E1122 Type 2 diabetes mellitus with diabetic chronic kidney disease: Secondary | ICD-10-CM | POA: Insufficient documentation

## 2024-05-22 DIAGNOSIS — Z8509 Personal history of malignant neoplasm of other digestive organs: Secondary | ICD-10-CM | POA: Diagnosis not present

## 2024-05-22 DIAGNOSIS — N1831 Chronic kidney disease, stage 3a: Secondary | ICD-10-CM | POA: Diagnosis not present

## 2024-05-22 DIAGNOSIS — R7889 Finding of other specified substances, not normally found in blood: Secondary | ICD-10-CM | POA: Diagnosis not present

## 2024-05-22 DIAGNOSIS — I129 Hypertensive chronic kidney disease with stage 1 through stage 4 chronic kidney disease, or unspecified chronic kidney disease: Secondary | ICD-10-CM | POA: Insufficient documentation

## 2024-05-22 DIAGNOSIS — Z7901 Long term (current) use of anticoagulants: Secondary | ICD-10-CM | POA: Diagnosis not present

## 2024-05-22 DIAGNOSIS — D649 Anemia, unspecified: Secondary | ICD-10-CM | POA: Diagnosis not present

## 2024-05-22 LAB — BASIC METABOLIC PANEL WITH GFR
Anion gap: 13 (ref 5–15)
BUN: 35 mg/dL — ABNORMAL HIGH (ref 8–23)
CO2: 22 mmol/L (ref 22–32)
Calcium: 9.1 mg/dL (ref 8.9–10.3)
Chloride: 106 mmol/L (ref 98–111)
Creatinine, Ser: 1.99 mg/dL — ABNORMAL HIGH (ref 0.44–1.00)
GFR, Estimated: 25 mL/min — ABNORMAL LOW (ref >60)
Glucose, Bld: 137 mg/dL — ABNORMAL HIGH (ref 70–99)
Potassium: 4.1 mmol/L (ref 3.5–5.1)
Sodium: 141 mmol/L (ref 135–145)

## 2024-05-22 LAB — CBC WITH DIFFERENTIAL/PLATELET
Abs Immature Granulocytes: 0.09 K/uL — ABNORMAL HIGH (ref 0.00–0.07)
Basophils Absolute: 0 K/uL (ref 0.0–0.1)
Basophils Relative: 0 %
Eosinophils Absolute: 0.1 K/uL (ref 0.0–0.5)
Eosinophils Relative: 1 %
HCT: 20.5 % — ABNORMAL LOW (ref 36.0–46.0)
Hemoglobin: 6.6 g/dL — CL (ref 12.0–15.0)
Immature Granulocytes: 1 %
Lymphocytes Relative: 5 %
Lymphs Abs: 0.7 K/uL (ref 0.7–4.0)
MCH: 31.3 pg (ref 26.0–34.0)
MCHC: 32.2 g/dL (ref 30.0–36.0)
MCV: 97.2 fL (ref 80.0–100.0)
Monocytes Absolute: 1.6 K/uL — ABNORMAL HIGH (ref 0.1–1.0)
Monocytes Relative: 13 %
Neutro Abs: 9.8 K/uL — ABNORMAL HIGH (ref 1.7–7.7)
Neutrophils Relative %: 80 %
Platelets: 228 K/uL (ref 150–400)
RBC: 2.11 MIL/uL — ABNORMAL LOW (ref 3.87–5.11)
RDW: 19.2 % — ABNORMAL HIGH (ref 11.5–15.5)
WBC: 12.3 K/uL — ABNORMAL HIGH (ref 4.0–10.5)
nRBC: 0.8 % — ABNORMAL HIGH (ref 0.0–0.2)

## 2024-05-22 NOTE — ED Provider Notes (Incomplete)
 [place] EMERGENCY DEPARTMENT AT [facility] Provider Note   CSN: [id number] Arrival date & time: 05/22/24  8176     Patient presents with: Abnormal Lab   [name] RINGGENBERG is a 83 y.o. female.  {Add pertinent medical, surgical, social history, OB history to HPI:32964} HPI   83 year old female with past medical history of gallbladder cancer, chronic anemia requiring multiple transfusions presents to the emergency department with concern for anemia.  Patient has required multiple transfusions/infusions.  Coming in today after routine outpatient blood work showed a lower hemoglobin.   she denies any acute symptoms including chest pain, shortness of breath, dizziness.  She is hungry and otherwise has no acute complaints.  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  Prior to Admission medications   Medication Sig Start Date End Date Taking? Authorizing Provider  acetaminophen  (TYLENOL ) 500 MG tablet Take 1,000 mg by mouth every 6 (six) hours as needed for moderate pain (pain score 4-6), fever or mild pain (pain score 1-3).    [provider]  brimonidine  (ALPHAGAN ) 0.2 % ophthalmic solution Place 1 drop into the right eye 2 (two) times daily. 01/19/21   [provider]  doxycycline  (VIBRA -TABS) 100 MG tablet Take 1 tablet (100 mg total) by mouth every 12 (twelve) hours. 04/16/24   Shalhoub, Zachary PARAS, MD  feeding supplement (ENSURE ENLIVE / ENSURE PLUS) LIQD Take 237 mLs by mouth 2 (two) times daily between meals. 02/18/24   Krishnan, Gokul, MD  ferrous sulfate  325 (65 FE) MG EC tablet Take 1 tablet (325 mg total) by mouth daily before breakfast. To be taken with a cup of orange juice on an otherwise empty stomach if possible 04/16/24 04/16/25  Shalhoub, Zachary PARAS, MD  HUMALOG  MIX 75/25 KWIKPEN (75-25) 100 UNIT/ML KwikPen Inject 6 Units into the skin daily. 04/20/24    [provider]  hydrochlorothiazide  (HYDRODIURIL ) 12.5 MG tablet Take 12.5 mg by mouth daily.    [provider]  JARDIANCE  25 MG TABS tablet Take 25 mg by mouth daily. 04/20/24   [provider]  magnesium  oxide (MAG-OX) 400 (240 Mg) MG tablet Take 400 mg by mouth 2 (two) times daily.    [provider]  meropenem  1 g in sodium chloride  0.9 % 100 mL Inject 1 g into the vein every 12 (twelve) hours. 04/16/24   Shalhoub, Zachary PARAS, MD  Metformin  HCl 500 MG/5ML SOLN Take 10 mLs by mouth in the morning and at bedtime. 04/20/24   [provider]  metoprolol  tartrate (LOPRESSOR ) 25 MG tablet Take 1 tablet (25 mg total) by mouth 2 (two) times daily. 02/18/24   Krishnan, Gokul, MD  mirtazapine  (REMERON ) 7.5 MG tablet Take 15 mg by mouth at bedtime.    [provider]  pantoprazole  (PROTONIX ) 40 MG tablet Take 1 tablet (40 mg total) by mouth 2 (two) times daily. 05/01/24 05/01/25  Adhikari, Amrit, MD  polyethylene glycol (MIRALAX  / GLYCOLAX ) 17 g packet Take 17 g by mouth daily. Patient taking differently: Take 17 g by mouth daily as needed. 02/19/24   Krishnan, Gokul, MD  potassium chloride  (MICRO-K ) 10 MEQ CR capsule Take 10 mEq by mouth 2 (two) times daily.    [provider]  Probiotic Product (PROBIOTIC PO) Take 1 capsule by mouth daily.    [provider]  Propylene Glycol (SYSTANE BALANCE) 0.6 % SOLN Place 1 drop into the left  eye 3 (three) times daily as needed (dry/irritated eyes).    [provider]  repaglinide  (PRANDIN ) 1 MG tablet Take 1 mg by mouth 2 (two) times daily before a meal.    [provider]  telmisartan  (MICARDIS ) 40 MG tablet Take 40 mg by mouth daily. 03/27/24   [provider]  traZODone  (DESYREL ) 100 MG tablet Take 100 mg by mouth at bedtime. 03/12/24   [provider]    Allergies: Versed  [midazolam ], Bactrim  [sulfamethoxazole -trimethoprim ], Augmentin [amoxicillin-pot clavulanate],  Demerol [meperidine], Meperidine hcl, Scopolamine, and Tramadol     Review of Systems  Constitutional:  Positive for fatigue. Negative for fever.  Respiratory:  Negative for shortness of breath.   Cardiovascular:  Negative for chest pain and leg swelling.  Gastrointestinal:  Negative for abdominal pain, diarrhea and vomiting.  Skin:  Negative for rash.  Neurological:  Negative for light-headedness and headaches.    Updated Vital Signs BP (!) 124/48   Pulse (!) 101   Temp 97.6 F (36.4 C) (Oral)   Resp 16   SpO2 100%   Physical Exam Vitals and nursing note reviewed.  Constitutional:      General: She is not in acute distress.    Appearance: Normal appearance.  HENT:     Head: Normocephalic.     Mouth/Throat:     Mouth: Mucous membranes are moist.  Cardiovascular:     Rate and Rhythm: Normal rate.  Pulmonary:     Effort: Pulmonary effort is normal. No respiratory distress.  Abdominal:     Palpations: Abdomen is soft.     Tenderness: There is no abdominal tenderness. There is no guarding.  Skin:    General: Skin is warm.     Comments: Sacral wounds are well dressed, no discharge or foul odor, no active bleeding  Neurological:     Mental Status: She is alert and oriented to person, place, and time. Mental status is at baseline.  Psychiatric:        Mood and Affect: Mood normal.     (all labs ordered are listed, but only abnormal results are displayed) Labs Reviewed  CBC WITH DIFFERENTIAL/PLATELET - Abnormal; Notable for the following components:      Result Value   WBC 12.3 (*)    RBC 2.11 (*)    Hemoglobin 6.6 (*)    HCT 20.5 (*)    RDW 19.2 (*)    nRBC 0.8 (*)    Neutro Abs 9.8 (*)    Monocytes Absolute 1.6 (*)    Abs Immature Granulocytes 0.09 (*)    All other components within normal limits  BASIC METABOLIC PANEL WITH GFR - Abnormal; Notable for the following components:   Glucose, Bld 137 (*)    BUN 35 (*)    Creatinine, Ser 1.99 (*)    GFR, Estimated 25  (*)    All other components within normal limits  PROTIME-INR  TYPE AND SCREEN    EKG: None  Radiology: No results found.  {Document cardiac monitor, telemetry assessment procedure when appropriate:32947} Procedures   Medications Ordered in the ED - No data to display    {Click here for ABCD2, HEART and other calculators REFRESH Note before signing:1}                              Medical Decision Making Amount and/or Complexity of Data Reviewed Labs: ordered.   83 year old female presents emergency department with concern for  anemia on outpatient workup.  History of chronic anemia requiring multiple transfusions and infusions.  Normal vitals on arrival.  She is asymptomatic, feels well and is requesting to eat and drink.  She has no findings of active bleeding.  Baseline hemoglobin is 8, today her blood work shows 6.6.  She has chronic ongoing AKI.  Patient will require transfusion.  But without findings of active bleeding, acute symptoms I feel she can be transfused, reevaluated and discharged home.  This is a ongoing finding in process for the patient.  She prefers to not be admitted and is currently staying at a facility that can repeat blood work.  Plan will be to transfuse the patient here in the ER and move towards discharge.  Patient at this time appears safe and stable for discharge and close outpatient follow up. Discharge plan and strict return to ED precautions discussed, patient verbalizes understanding and agreement.  {Document critical care time when appropriate  Document review of labs and clinical decision tools ie [id number], etc  Document your independent review of radiology images and any outside records  Document your discussion with family members, caretakers and with consultants  Document social determinants of health affecting pt's care  Document your decision making why or why not admission, treatments were needed:32947:::1}   Final diagnoses:  None     ED Discharge Orders     None

## 2024-05-22 NOTE — ED Provider Notes (Addendum)
 [name] EM[facility]Y DE[facility] AT [facility] Provider Note   CSN: [id number] Arrival date & time: 05/22/24  8176     Patient presents with: Abnormal Lab   [name] is a 83 y.o. female.   HPI   83 year old female with past medical history of gallbladder cancer, chronic anemia requiring multiple transfusions presents to the emergency department with concern for anemia.  Patient has required multiple transfusions/infusions.  Coming in today after routine outpatient blood work showed a lower hemoglobin.   she denies any acute symptoms including chest pain, shortness of breath, dizziness.  She is hungry and otherwise has no acute complaints.  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  Prior to Admission medications   Medication Sig Start Date End Date Taking? Authorizing Provider  acetaminophen  (TYLENOL ) 500 MG tablet Take 1,000 mg by mouth every 6 (six) hours as needed for moderate pain (pain score 4-6), fever or mild pain (pain score 1-3).    [provider]  brimonidine  (ALPHAGAN ) 0.2 % ophthalmic solution Place 1 drop into the right eye 2 (two) times daily. 01/19/21   [provider]  doxycycline  (VIBRA -TABS) 100 MG tablet Take 1 tablet (100 mg total) by mouth every 12 (twelve) hours. 04/16/24   Shalhoub, Zachary PARAS, MD  feeding supplement (ENSURE ENLIVE / ENSURE PLUS) LIQD Take 237 mLs by mouth 2 (two) times daily between meals. 02/18/24   Krishnan, Gokul, MD  ferrous sulfate  325 (65 FE) MG EC tablet Take 1 tablet (325 mg total) by mouth daily before breakfast. To be taken with a cup of orange juice on an otherwise empty stomach if possible 04/16/24 04/16/25  Shalhoub, Zachary PARAS, MD  HUMALOG  MIX 75/25 KWIKPEN (75-25) 100 UNIT/ML KwikPen Inject 6 Units into the skin daily. 04/20/24   [provider]  hydrochlorothiazide  (HYDRODIURIL ) 12.5 MG tablet Take  12.5 mg by mouth daily.    [provider]  JARDIANCE  25 MG TABS tablet Take 25 mg by mouth daily. 04/20/24   [provider]  magnesium  oxide (MAG-OX) 400 (240 Mg) MG tablet Take 400 mg by mouth 2 (two) times daily.    [provider]  meropenem  1 g in sodium chloride  0.9 % 100 mL Inject 1 g into the vein every 12 (twelve) hours. 04/16/24   Shalhoub, George J, MD  Metformin  HCl 500 MG/5ML SOLN Take 10 mLs by mouth in the morning and at bedtime. 04/20/24   [provider]  metoprolol  tartrate (LOPRESSOR ) 25 MG tablet Take 1 tablet (25 mg total) by mouth 2 (two) times daily. 02/18/24   Krishnan, Gokul, MD  mirtazapine  (REMERON ) 7.5 MG tablet Take 15 mg by mouth at bedtime.    [provider]  pantoprazole  (PROTONIX ) 40 MG tablet Take 1 tablet (40 mg total) by mouth 2 (two) times daily. 05/01/24 05/01/25  Adhikari, Amrit, MD  polyethylene glycol (MIRALAX  / GLYCOLAX ) 17 g packet Take 17 g by mouth daily. Patient taking differently: Take 17 g by mouth daily as needed. 02/19/24   Krishnan, Gokul, MD  potassium chloride  (MICRO-K ) 10 MEQ CR capsule Take 10 mEq by mouth 2 (two) times daily.    [provider]  Probiotic Product (PROBIOTIC PO) Take 1 capsule by mouth daily.    [provider]  Propylene Glycol (SYSTANE BALANCE) 0.6 % SOLN Place 1 drop into the left eye 3 (three) times daily as needed (dry/irritated eyes).  [provider]  repaglinide  (PRANDIN ) 1 MG tablet Take 1 mg by mouth 2 (two) times daily before a meal.    [provider]  telmisartan  (MICARDIS ) 40 MG tablet Take 40 mg by mouth daily. 03/27/24   [provider]  traZODone  (DESYREL ) 100 MG tablet Take 100 mg by mouth at bedtime. 03/12/24   [provider]    Allergies: Versed  [midazolam ], Bactrim  [sulfamethoxazole -trimethoprim ], Augmentin [amoxicillin-pot clavulanate], Demerol [meperidine], Meperidine hcl, Scopolamine, and Tramadol     Review of  Systems  Constitutional:  Positive for fatigue. Negative for fever.  Respiratory:  Negative for shortness of breath.   Cardiovascular:  Negative for chest pain and leg swelling.  Gastrointestinal:  Negative for abdominal pain, diarrhea and vomiting.  Skin:  Negative for rash.  Neurological:  Negative for light-headedness and headaches.    Updated Vital Signs BP (!) 124/48   Pulse (!) 101   Temp 97.6 F (36.4 C) (Oral)   Resp 16   SpO2 100%   Physical Exam Vitals and nursing note reviewed.  Constitutional:      General: She is not in acute distress.    Appearance: Normal appearance.  HENT:     Head: Normocephalic.     Mouth/Throat:     Mouth: Mucous membranes are moist.  Cardiovascular:     Rate and Rhythm: Normal rate.  Pulmonary:     Effort: Pulmonary effort is normal. No respiratory distress.  Abdominal:     Palpations: Abdomen is soft.     Tenderness: There is no abdominal tenderness. There is no guarding.  Skin:    General: Skin is warm.     Comments: Sacral wounds are well dressed, no discharge or foul odor, no active bleeding  Neurological:     Mental Status: She is alert and oriented to person, place, and time. Mental status is at baseline.  Psychiatric:        Mood and Affect: Mood normal.     (all labs ordered are listed, but only abnormal results are displayed) Labs Reviewed  CBC WITH DIFFERENTIAL/PLATELET - Abnormal; Notable for the following components:      Result Value   WBC 12.3 (*)    RBC 2.11 (*)    Hemoglobin 6.6 (*)    HCT 20.5 (*)    RDW 19.2 (*)    nRBC 0.8 (*)    Neutro Abs 9.8 (*)    Monocytes Absolute 1.6 (*)    Abs Immature Granulocytes 0.09 (*)    All other components within normal limits  BASIC METABOLIC PANEL WITH GFR - Abnormal; Notable for the following components:   Glucose, Bld 137 (*)    BUN 35 (*)    Creatinine, Ser 1.99 (*)    GFR, Estimated 25 (*)    All other components within normal limits  PROTIME-INR  TYPE AND  SCREEN    EKG: None  Radiology: No results found.   .Critical Care  Performed by: Bari Roxie HERO, DO Authorized by: Bari Roxie HERO, DO   Critical care provider statement:    Critical care time (minutes):  30   Critical care time was exclusive of:  Separately billable procedures and treating other patients   Critical care was necessary to treat or prevent imminent or life-threatening deterioration of the following conditions:  Metabolic crisis   Critical care was time spent personally by me on the following activities:  Development of treatment plan with patient or surrogate, discussions with consultants, evaluation of patient's response  to treatment, examination of patient, ordering and review of laboratory studies, ordering and review of radiographic studies, ordering and performing treatments and interventions, pulse oximetry, re-evaluation of patient's condition and review of old charts   I assumed direction of critical care for this patient from another provider in my specialty: no      Medications Ordered in the ED - No data to display                                  Medical Decision Making Amount and/or Complexity of Data Reviewed Labs: ordered.   83 year old female presents emergency department with concern for anemia on outpatient workup.  History of chronic anemia requiring multiple transfusions and infusions.  Normal vitals on arrival.  She is asymptomatic, feels well and is requesting to eat and drink.  She has no findings of active bleeding.  Baseline hemoglobin is 8, today her blood work shows 6.6.  She has chronic ongoing AKI.  Patient will require transfusion.  But without findings of active bleeding, acute symptoms I feel she can be transfused, reevaluated and discharged home.  This is a ongoing finding in process for the patient.  She prefers to not be admitted and is currently staying at a facility that can repeat blood work.  Plan will be to transfuse the  patient here in the ER and move towards discharge.  Patient at this time appears safe and stable for discharge and close outpatient follow up. Discharge plan and strict return to ED precautions discussed, patient verbalizes understanding and agreement.     Final diagnoses:  None    ED Discharge Orders     None          Bari Roxie HERO, DO 05/23/24 0009    Bari Roxie HERO, DO 05/23/24 [id number]

## 2024-05-22 NOTE — ED Triage Notes (Signed)
 Hx gallbladder cancer. [facility] E[facility] from [facility] for low hgb of 6.2. S/Sx fatigue. 5 transfusions in the last 6 weeks.

## 2024-05-23 ENCOUNTER — Encounter (HOSPITAL_COMMUNITY): Payer: Self-pay | Admitting: Internal Medicine

## 2024-05-23 ENCOUNTER — Other Ambulatory Visit: Payer: Self-pay

## 2024-05-23 DIAGNOSIS — Z743 Need for continuous supervision: Secondary | ICD-10-CM | POA: Diagnosis not present

## 2024-05-23 DIAGNOSIS — D649 Anemia, unspecified: Principal | ICD-10-CM

## 2024-05-23 DIAGNOSIS — R4182 Altered mental status, unspecified: Secondary | ICD-10-CM | POA: Diagnosis not present

## 2024-05-23 LAB — CBC
HCT: 27.5 % — ABNORMAL LOW (ref 36.0–46.0)
Hemoglobin: 9.1 g/dL — ABNORMAL LOW (ref 12.0–15.0)
MCH: 29.7 pg (ref 26.0–34.0)
MCHC: 33.1 g/dL (ref 30.0–36.0)
MCV: 89.9 fL (ref 80.0–100.0)
Platelets: 196 K/uL (ref 150–400)
RBC: 3.06 MIL/uL — ABNORMAL LOW (ref 3.87–5.11)
RDW: 17.6 % — ABNORMAL HIGH (ref 11.5–15.5)
WBC: 11.6 K/uL — ABNORMAL HIGH (ref 4.0–10.5)
nRBC: 1.1 % — ABNORMAL HIGH (ref 0.0–0.2)

## 2024-05-23 LAB — POC OCCULT BLOOD, ED: Fecal Occult Bld: POSITIVE — AB

## 2024-05-23 LAB — PROTIME-INR
INR: 4.1 (ref 0.8–1.2)
Prothrombin Time: 41.3 s — ABNORMAL HIGH (ref 11.4–15.2)

## 2024-05-23 LAB — PREPARE RBC (CROSSMATCH)

## 2024-05-23 MED ORDER — PANTOPRAZOLE SODIUM 40 MG IV SOLR: 80.0000 mg | Freq: Once | INTRAVENOUS | Status: DC

## 2024-05-23 MED ORDER — SODIUM CHLORIDE 0.9% IV SOLUTION: Freq: Once | INTRAVENOUS | Status: DC

## 2024-05-23 MED ORDER — SODIUM CHLORIDE 0.9 % IV BOLUS: 500.0000 mL | Freq: Once | INTRAVENOUS | Status: DC

## 2024-05-23 MED ORDER — HEPARIN SOD (PORK) LOCK FLUSH 100 UNIT/ML IV SOLN
500.0000 [IU] | INTRAVENOUS | Status: DC | PRN
  Filled 2024-05-23: qty 5

## 2024-05-23 NOTE — H&P (Signed)
 [facility] History and Physical  [name] FMW:[id number] DOB: September 07, 1941 DOA: 05/22/2024  Referring physician: ED  PCP: Chrystal Lamarr RAMAN, MD   Patient is coming from: Skilled nursing facility  Chief Complaint: Low hemoglobin  HPI:  Patient is a 83 year old female chronic normocytic anemia, CAD, CABG, paroxysmal atrial fibrillation on Xarelto , coronary artery disease, peripheral vascular disease status post angioplasty, hyperlipidemia, CKD 3, DM type II, GERD, hyperlipidemia, hypertension, cholangiocarcinoma stage IV, pressure ulcer with chronic wound VAC, sacral osteomyelitis, chronic thrombocytopenia presented to hospital with concerns for low hemoglobin bleeding routine checkup today.  Hemoglobin was noted to be 6.2.  Patient denies any active GI bleed but is on Xarelto .  In the ED patient was hemodynamically stable.  Hemoglobin was 6.6 with WBC at 12.3 likely at baseline.  Platelets were 228.  Creatinine was elevated at 1.99 baseline around 1.3-1.7.  In the ED 2 units of PRBC were ordered and patient was considered for admission to hospital for acute on chronic anemia with AKI.  Assessment and Plan Principal Problem:   Symptomatic anemia Acute on chronic anemia.   Has received transfusions in the past.  Hemoglobin on presentation at 6.6.  FOBT was positive.  Patient has multiple reasons for this anemia including history of cholangiocarcinoma, history of hemorrhoids.  Seen by GI and likely hemorrhoidal in nature.  No further intervention planned.  Patient placed in observation for transfusion.  Hemoglobin after transfusion at 9.1.    History of CAD peripheral vascular disease s/p angioplasty.  On Xarelto  at home.  Will continue.   History of pressure ulcers.    Continue wound care at the facility.   Mild AKI on stage III.  Creatinine 1.9.  Baseline around 1.1-1.3.  Creatinine 1 week back was 1.7.  Will need to monitor as outpatient.  Received 2 units of packed RBC.    History of cholangiocarcinoma stage IV.  Status post stent.  Outpatient follow-up recommended.   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 There is a small calcified mass in the L:A near the LA appendage. No valvular masses seen with associated mitral annular calcification. LA Volume/ BSA27.4 ml/m2 No AS. Right ventricular systolic pressure is elevated at .   Jaundice    Myocardial infarction [facility])    June 2021   Neuromuscular disorder (HCC)    neuropathy in bilateral feet   Peripheral vascular disease (HCC)    Pneumonia    not hosp.    S/P angioplasty with stent Lt SFA  of prox segment.  PTCAs with drug coated balloon Lt ant tibial artery and Lt popliteal artery  11/26/2019   Past Surgical History:  Procedure Laterality Date   ABDOMINAL AORTAGRAM  11/25/2019   ABDOMINAL AORTOGRAM W/LOWER EXTREMITY    ABDOMINAL AORTOGRAM N/A 06/15/2019   Procedure: ABDOMINAL AORTOGRAM;  Surgeon: Court Dorn PARAS, MD;  [facility];  Service: Cardiovascular;  Laterality: N/A;   ABDOMINAL AORTOGRAM W/LOWER  EXTREMITY N/A 11/25/2019   Procedure: ABDOMINAL AORTOGRAM W/LOWER EXTREMITY;  Surgeon: Darron Deatrice LABOR, MD;  [facility]: [facility];  Service: Cardiovascular;  Laterality: N/A;  Lt leg   ABDOMINAL AORTOGRAM W/LOWER EXTREMITY N/A 08/01/2020   Procedure: ABDOMINAL AORTOGRAM W/LOWER EXTREMITY;  Surgeon: Court Dorn PARAS, MD;  [facility];  Service: Cardiovascular;  Laterality: N/A;   ABDOMINAL AORTOGRAM W/LOWER EXTREMITY N/A 04/28/2021   Procedure: ABDOMINAL AORTOGRAM W/LOWER EXTREMITY;  Surgeon: Harvey Carlin BRAVO, MD;  [facility];  Service: Cardiovascular;  Laterality: N/A;   ABDOMINAL AORTOGRAM W/LOWER EXTREMITY N/A 10/13/2021   Procedure: ABDOMINAL AORTOGRAM W/LOWER EXTREMITY;  Surgeon: Magda Debby SAILOR, MD;  [facility];  Service: Cardiovascular;  Laterality: N/A;   ABDOMINAL AORTOGRAM W/LOWER EXTREMITY N/A 11/09/2022   Procedure: ABDOMINAL AORTOGRAM W/LOWER EXTREMITY;  Surgeon: Magda Debby SAILOR, MD;  [facility];  Service: Cardiovascular;  Laterality: N/A;   ABDOMINAL AORTOGRAM W/LOWER EXTREMITY N/A 11/16/2022   Procedure: ABDOMINAL AORTOGRAM W/LOWER EXTREMITY;  Surgeon: Magda Debby SAILOR, MD;  [facility];  Service: Cardiovascular;  Laterality: N/A;   AMPUTATION TOE Left 05/03/2021   Procedure: AMPUTATION OF THIRD LEFT  TOE;  Surgeon: Magda Debby SAILOR, MD;  Location: [facility] OR;  Service: Vascular;  Laterality: Left;   APPENDECTOMY     APPLICATION OF WOUND VAC Right 07/21/2019   Procedure: Application Of Prevena Wound Vac Right Groin;  Surgeon: Serene Gaile ORN, MD;  Location: [facility] OR;  Service: Vascular;  Laterality: Right;   BILIARY BRUSHING  04/07/2023   Procedure: BILIARY BRUSHING;  Surgeon: Rosalie Kitchens, MD;  Location: [facility] ENDOSCOPY;  Service: Gastroenterology;;   BILIARY STENT PLACEMENT  04/07/2023   Procedure: BILIARY STENT PLACEMENT;  Surgeon: Rosalie Kitchens, MD;  Location: [facility] ENDOSCOPY;  Service:  Gastroenterology;;   CARPAL TUNNEL RELEASE Left    CARPAL TUNNEL RELEASE Right    CESAREAN SECTION     x 2   CHOLECYSTECTOMY N/A 10/12/2022   Procedure: LAPAROSCOPIC PARTIAL FENESTRATING CHOLECYSTECTOMY;  Surgeon: Dasie Leonor CROME, MD;  [facility];  Service: General;  Laterality: N/A;   COLONOSCOPY     CORONARY ARTERY BYPASS GRAFT N/A 03/24/2020   Procedure: CORONARY ARTERY BYPASS GRAFTING (CABG) using LIMA to LAD (m); RIMA to RAMUS; Endoscopic Right Greater Saphenous Vein: SVG to Diag1; SVG to PLB (right); and SVG to PL (left).;  Surgeon: German Bartlett PEDLAR, MD;  [facility];  Service: Open Heart Surgery;  Laterality: N/A;  BILATERAL IMA   ENDARTERECTOMY FEMORAL Right 07/21/2019   Procedure: RIGHT ENDARTERECTOMY FEMORAL WITH PATCH ANGIOPLASTY;  Surgeon: Serene Gaile ORN, MD;  [facility];  Service: Vascular;  Laterality: Right;   ENDARTERECTOMY FEMORAL Right 08/16/2020   Procedure: RIGHT FEMORAL ENDARTERECTOMY;  Surgeon: Sheree Penne Bruckner, MD;  Location: [facility] OR;  Service: Vascular;  Laterality: Right;   ENDOVEIN HARVEST OF GREATER SAPHENOUS VEIN Right 03/24/2020   Procedure: ENDOVEIN HARVEST OF GREATER SAPHENOUS VEIN;  Surgeon: German Bartlett PEDLAR, MD;  [facility];  Service:  Open Heart Surgery;  Laterality: Right;   ERCP N/A 04/07/2023   Procedure: ENDOSCOPIC RETROGRADE CHOLANGIOPANCREATOGRAPHY (ERCP);  Surgeon: Rosalie Kitchens, MD;  Location: [facility] ENDOSCOPY;  Service: Gastroenterology;  Laterality: N/A;   EYE SURGERY     cataract removal bilaterally   FEMORAL-POPLITEAL BYPASS GRAFT Right 08/16/2020   Procedure: BYPASS GRAFT FEMORAL-POPLITEAL ARTERY;  Surgeon: Sheree Penne Bruckner, MD;  Location: [facility] OR;  Service: Vascular;  Laterality: Right;   FEMORAL-POPLITEAL BYPASS GRAFT Left 05/03/2021   Procedure: LEFT FEMORAL TO BELOW KNEE POPLITEAL ARTERY BYPASS GRAFTING WITH 6MMX80 PTFE GRAFT;  Surgeon: Magda Debby SAILOR, MD;  [facility];  Service: Vascular;  Laterality: Left;    LEFT HEART CATH AND CORONARY ANGIOGRAPHY N/A 03/22/2020   Procedure: LEFT HEART CATH AND CORONARY ANGIOGRAPHY;  Surgeon: [place], Peter M, MD;  Location: [facility] INVASIVE CV LAB;  Service: Cardiovascular;  Laterality: N/A;   LOWER EXTREMITY ANGIOGRAPHY Bilateral 06/15/2019   Procedure: Lower Extremity Angiography;  Surgeon: Court Dorn PARAS, MD;  Location: [facility] INVASIVE CV LAB;  Service: Cardiovascular;  Laterality: Bilateral;   LOWER EXTREMITY ANGIOGRAPHY Right 08/03/2019   Procedure: LOWER EXTREMITY ANGIOGRAPHY;  Surgeon: Court Dorn PARAS, MD;  [facility];  Service: Cardiovascular;  Laterality: Right;   LOWER EXTREMITY ANGIOGRAPHY N/A 09/07/2021   Procedure: LOWER EXTREMITY ANGIOGRAPHY;  Surgeon: Gretta Bruckner PARAS, MD;  [facility];  Service: Cardiovascular;  Laterality: N/A;   PATCH ANGIOPLASTY Right 07/21/2019   Procedure: Patch Angioplasty Right Femoral Artery;  Surgeon: Serene Gaile ORN, MD;  Location: [facility] OR;  Service: Vascular;  Laterality: Right;   PERIPHERAL INTRAVASCULAR LITHOTRIPSY  11/25/2019   Procedure: INTRAVASCULAR LITHOTRIPSY;  Surgeon: Darron Deatrice LABOR, MD;  [facility];  Service: Cardiovascular;;  LT. SFA   PERIPHERAL VASCULAR ATHERECTOMY  08/03/2019   Procedure: PERIPHERAL VASCULAR ATHERECTOMY;  Surgeon: Court Dorn PARAS, MD;  Location: [facility] INVASIVE CV LAB;  Service: Cardiovascular;;  right SFA, right TP trunk   PERIPHERAL VASCULAR ATHERECTOMY  11/25/2019   Procedure: PERIPHERAL VASCULAR ATHERECTOMY;  Surgeon: Darron Deatrice LABOR, MD;  [facility];  Service: Cardiovascular;;  Lt.  POPLITEAL and AT   PERIPHERAL VASCULAR BALLOON ANGIOPLASTY Left 06/15/2019   Procedure: PERIPHERAL VASCULAR BALLOON ANGIOPLASTY;  Surgeon: Court Dorn PARAS, MD;  [facility];  Service: Cardiovascular;  Laterality: Left;  SFA UNSUCCESSFUL UNABLE TO CROSS LESION   PERIPHERAL VASCULAR BALLOON ANGIOPLASTY  08/03/2019    Procedure: PERIPHERAL VASCULAR BALLOON ANGIOPLASTY;  Surgeon: Court Dorn PARAS, MD;  [facility];  Service: Cardiovascular;;  right SFA, Right TP trunk   PERIPHERAL VASCULAR BALLOON ANGIOPLASTY  09/07/2021   Procedure: PERIPHERAL VASCULAR BALLOON ANGIOPLASTY;  Surgeon: Gretta Bruckner PARAS, MD;  [facility];  Service: Cardiovascular;;   PERIPHERAL VASCULAR BALLOON ANGIOPLASTY Right 10/13/2021   Procedure: PERIPHERAL VASCULAR BALLOON ANGIOPLASTY;  Surgeon: Magda Debby SAILOR, MD;  [facility];  Service: Cardiovascular;  Laterality: Right;   PERIPHERAL VASCULAR BALLOON ANGIOPLASTY  11/09/2022   Procedure: PERIPHERAL VASCULAR BALLOON ANGIOPLASTY;  Surgeon: Magda Debby SAILOR, MD;  [facility];  Service: Cardiovascular;;  fem-pop bypass and AT   PORTACATH PLACEMENT Left 04/29/2023   Procedure: INSERTION PORT-A-CATH;  Surgeon: Dasie Leonor CROME, MD;  Location: [facility] ORS;  Service: General;  Laterality: Left;   PORTACATH PLACEMENT N/A 07/12/2023   Procedure: REVISION PORT-A-CATH;  Surgeon: Dasie Leonor CROME, MD;  Location: [facility] OR;  Service: General;  Laterality:  N/A;   REMOVAL OF STONES  04/07/2023   Procedure: REMOVAL OF STONES;  Surgeon: Rosalie Kitchens, MD;  Location: [facility] ENDOSCOPY;  Service: Gastroenterology;;   ANNETT  04/07/2023   Procedure: ANNETT;  Surgeon: Rosalie Kitchens, MD;  Location: [facility] ENDOSCOPY;  Service: Gastroenterology;;   TEE WITHOUT CARDIOVERSION N/A 03/24/2020   Procedure: TRANSESOPHAGEAL ECHOCARDIOGRAM (TEE);  Surgeon: German Bartlett PEDLAR, MD;  Location: [facility] OR;  Service: Open Heart Surgery;  Laterality: N/A;   TONSILLECTOMY     and adenoidectomy   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/2019   Procedure: TRANSMETATARSAL AMPUTATION;  Surgeon: Gershon Donnice SAUNDERS, DPM;  Location: [facility] OR;  Service: Podiatry;  Laterality: Right;   TRANSMETATARSAL AMPUTATION Left 05/11/2021   Procedure: LEFT TRANSMETATARSAL AMPUTATION;  Surgeon:  Sheree Penne Bruckner, MD;  Location: [facility] OR;  Service: Vascular;  Laterality: Left;   TUBAL LIGATION      Social History:  reports that she quit smoking about 52 years ago. Her smoking use included cigarettes. She has never used smokeless tobacco. She reports that she does not drink alcohol  and does not use drugs.  Allergies  Allergen Reactions   Versed  [Midazolam ] Anxiety    Frantic, out of my mind, agitated    Bactrim  [Sulfamethoxazole -Trimethoprim ] Other (See Comments)    ^ K+( elevated)    Augmentin [Amoxicillin-Pot Clavulanate] Other (See Comments)    Elevates potassium level   Demerol [Meperidine]     Delusional    Meperidine Hcl     Other reaction(s): delusional   Scopolamine     Delusional   Patch *   Tramadol  Other (See Comments)    Keeps awake    Family History  Problem Relation Age of Onset   Cancer - Prostate Father    Cancer - Colon Father    Stroke Mother    Hypertension Mother    Hyperlipidemia Mother    Melanoma Brother      Prior to Admission medications   Medication Sig Start Date End Date Taking? Authorizing Provider  acetaminophen  (TYLENOL ) 500 MG tablet Take 1,000 mg by mouth every 6 (six) hours as needed for moderate pain (pain score 4-6), fever or mild pain (pain score 1-3).   Yes [provider]  brimonidine  (ALPHAGAN ) 0.2 % ophthalmic solution Place 1 drop into the right eye 2 (two) times daily. 01/19/21  Yes [provider]  ferrous sulfate  325 (65 FE) MG EC tablet Take 1 tablet (325 mg total) by mouth daily before breakfast. To be taken with a cup of orange juice on an otherwise empty stomach if possible 04/16/24 04/16/25 Yes Shalhoub, Zachary PARAS, MD  furosemide  (LASIX ) 20 MG tablet Take 20 mg by mouth daily. 05/21/24  Yes [provider]  hydrochlorothiazide  (HYDRODIURIL ) 12.5 MG tablet Take 12.5 mg by mouth daily.   Yes [provider]  JARDIANCE  25 MG TABS tablet Take 25 mg by mouth daily. 04/20/24  Yes  [provider]  magnesium  oxide (MAG-OX) 400 (240 Mg) MG tablet Take 400 mg by mouth 2 (two) times daily.   Yes [provider]  Metformin  HCl 500 MG/5ML SOLN Take 10 mLs by mouth in the morning and at bedtime. 04/20/24  Yes [provider]  metoprolol  tartrate (LOPRESSOR ) 25 MG tablet Take 1 tablet (25 mg total) by mouth 2 (two) times daily. 02/18/24  Yes Krishnan, Gokul, MD  mirtazapine  (REMERON ) 15 MG tablet Take 15 mg by mouth at bedtime.   Yes [provider]  pantoprazole  (PROTONIX ) 40 MG tablet Take  1 tablet (40 mg total) by mouth 2 (two) times daily. 05/01/24 05/01/25 Yes Adhikari, Amrit, MD  polyethylene glycol (MIRALAX  / GLYCOLAX ) 17 g packet Take 17 g by mouth daily. 02/19/24  Yes Krishnan, Gokul, MD  Probiotic Product (PROBIOTIC PO) Take 1 capsule by mouth daily.   Yes [provider]  Propylene Glycol (SYSTANE BALANCE) 0.6 % SOLN Place 1 drop into the left eye 3 (three) times daily as needed (dry/irritated eyes).   Yes [provider]  rosuvastatin  (CRESTOR ) 40 MG tablet Take 40 mg by mouth at bedtime. 05/01/24  Yes [provider]  telmisartan  (MICARDIS ) 40 MG tablet Take 40 mg by mouth daily. 03/27/24  Yes [provider]  traZODone  (DESYREL ) 100 MG tablet Take 100 mg by mouth at bedtime. 03/12/24  Yes [provider]  XARELTO  10 MG TABS tablet Take 10 mg by mouth daily. 05/13/24  Yes [provider]  doxycycline  (VIBRA -TABS) 100 MG tablet Take 1 tablet (100 mg total) by mouth every 12 (twelve) hours. Patient not taking: Reported on 05/23/2024 04/16/24   Kenard Zachary PARAS, MD  feeding supplement (ENSURE ENLIVE / ENSURE PLUS) LIQD Take 237 mLs by mouth 2 (two) times daily between meals. Patient not taking: Reported on 05/23/2024 02/18/24   Krishnan, Gokul, MD  meropenem  1 g in sodium chloride  0.9 % 100 mL Inject 1 g into the vein every 12 (twelve) hours. Patient not taking: Reported on 05/23/2024 04/16/24    Kenard Zachary PARAS, MD    Physical Exam:  Vitals:   05/23/24 0730 05/23/24 0732 05/23/24 0813 05/23/24 0907  BP: 116/61 116/61 (!) 123/49   Pulse: (!) 102 (!) 101 (!) 104   Resp: 20 17 17    Temp:  98.1 F (36.7 C) 98.4 F (36.9 C)   TempSrc:  Oral Oral   SpO2:  100% 100% 100%  Weight:    62.5 kg  Height:    5' 5 (1.651 m)   Wt Readings from Last 3 Encounters:  05/23/24 62.5 kg  05/20/24 61.7 kg  05/18/24 61.7 kg   Body mass index is 22.93 kg/m.  General:  Average built, not in obvious distress HENT: Normocephalic, No scleral pallor or icterus noted. Oral mucosa is moist.  Chest:  Clear breath sounds.  . No crackles or wheezes.  CVS: S1 &S2 heard. No murmur.  Regular rate and rhythm. Abdomen: Soft, nontender, nondistended.  Bowel sounds are heard. No abdominal mass palpated Extremities: No cyanosis, clubbing or edema.  Peripheral pulses are palpable. Psych: Alert, awake and oriented, normal mood CNS:  No cranial nerve deficits.  Power equal in all extremities.   Skin: Warm and dry.  No rashes noted.  Labs on Admission:   CBC: Recent Labs  Lab 05/18/24 0841 05/22/24 2047  WBC 9.5 12.3*  NEUTROABS 7.7 9.8*  HGB 8.0* 6.6*  HCT 24.1* 20.5*  MCV 91.3 97.2  PLT 212 228    Basic Metabolic Panel: Recent Labs  Lab 05/18/24 0841 05/22/24 2047  NA 139 141  K 4.1 4.1  CL 106 106  CO2 26 22  GLUCOSE 172* 137*  BUN 35* 35*  CREATININE 1.94* 1.99*  CALCIUM  7.9* 9.1    Liver Function Tests: Recent Labs  Lab 05/18/24 0841  AST 53*  ALT 44  ALKPHOS 347*  BILITOT 0.4  PROT 4.2*  ALBUMIN  2.0*   No results for input(s): LIPASE, AMYLASE in the last 168 hours. No results for input(s): AMMONIA in the last 168 hours.  Cardiac Enzymes: No results for input(s): CKTOTAL, CKMB, CKMBINDEX, TROPONINI in the last 168 hours.  BNP (last 3 results) No results for input(s): BNP in the last 8760 hours.  ProBNP (last 3 results) No results for input(s):  PROBNP in the last 8760 hours.  CBG: No results for input(s): GLUCAP in the last 168 hours.  Lipase     Component Value Date/Time   LIPASE 35 04/29/2024 0219     Urinalysis    Component Value Date/Time   COLORURINE YELLOW 05/12/2024 1113   APPEARANCEUR CLOUDY (A) 05/12/2024 1113   LABSPEC 1.013 05/12/2024 1113   PHURINE 5.0 05/12/2024 1113   GLUCOSEU >=500 (A) 05/12/2024 1113   HGBUR NEGATIVE 05/12/2024 1113   BILIRUBINUR NEGATIVE 05/12/2024 1113   KETONESUR NEGATIVE 05/12/2024 1113   PROTEINUR 30 (A) 05/12/2024 1113   NITRITE NEGATIVE 05/12/2024 1113   LEUKOCYTESUR TRACE (A) 05/12/2024 1113     Drugs of Abuse  No results found for: LABOPIA, COCAINSCRNUR, LABBENZ, AMPHETMU, THCU, LABBARB    Radiological Exams on Admission: No results found.  EKG: None level   Consultant: GI  Code Status: Full code  Microbiology none none  Antibiotics: None  Family Communication:  Patients' condition and plan of care including tests being ordered have been discussed with the patient  who indicate understanding and agree with the plan.   Status is: Observation The patient remains OBS appropriate and will d/c before 2 midnights.   Severity of Illness: The appropriate patient status for this patient is OBSERVATION. Observation status is judged to be reasonable and necessary in order to provide the required intensity of service to ensure the patient's safety. The patient's presenting symptoms, physical exam findings, and initial radiographic and laboratory data in the context of their medical condition is felt to place them at decreased risk for further clinical deterioration. Furthermore, it is anticipated that the patient will be medically stable for discharge from the hospital within 2 midnights of admission.   Signed, Vernal Alstrom, MD [facility] 05/23/2024

## 2024-05-23 NOTE — ED Notes (Signed)
 Blood infusing at 166ml/hr at this time.

## 2024-05-23 NOTE — Hospital Course (Addendum)
Acute on chronic anemia

## 2024-05-23 NOTE — Consult Note (Addendum)
 [facility] Consult  Referring Provider: [facility] hospitalist Primary Care Physician:  Chrystal Lamarr RAMAN, MD Primary Gastroenterologist: Dr. Rosalie  Reason for Consultation: Anemia  HPI: [name] is a 83 y.o. female with past medical history of gallbladder cancer with liver metastases, ascites, status postchemotherapy and radiation with plans to start immunotherapy, was advised to come to the ER when she had outpatient blood work performed which showed her hemoglobin was low at 6.   Patient states that she has been dealing with iron  deficiency anemia requiring frequent blood transfusion and periodic blood monitoring for many months now. She normally has 1 bowel movement a day and denies noticing blood in stool or black stools except occasionally noticing bright red blood when wiping which she believes is related to hemorrhoids.  Patient has lost about 25 pounds since starting radiation due to decreased appetite. Denies nausea, vomiting, acid reflux, difficulty swallowing or pain on swallowing.  Previous GI workup: ERCP 04/07/2023, jaundice, Dr. Rosalie: 10 French x 12 cm plastic biliary stent placed in CBD Colonoscopy, 2012, Dr. 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 There is a small calcified mass in the L:A near the LA appendage. No valvular masses seen with associated mitral annular calcification. LA Volume/ BSA27.4 ml/m2 No AS. Right ventricular systolic pressure is elevated at .   Jaundice    Myocardial infarction [facility])    June 2021   Neuromuscular disorder (HCC)    neuropathy in bilateral feet   Peripheral vascular disease (HCC)    Pneumonia    not hosp.    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  PTCAs with drug coated balloon Lt ant tibial artery and Lt popliteal artery  11/26/2019    Past Surgical History:  Procedure Laterality Date   ABDOMINAL AORTAGRAM  11/25/2019   ABDOMINAL AORTOGRAM W/LOWER EXTREMITY    ABDOMINAL AORTOGRAM N/A 06/15/2019   Procedure: ABDOMINAL AORTOGRAM;  Surgeon: Court Dorn PARAS, MD;  [facility];  Service: Cardiovascular;  Laterality: N/A;   ABDOMINAL AORTOGRAM W/LOWER EXTREMITY N/A 11/25/2019   Procedure: ABDOMINAL AORTOGRAM W/LOWER EXTREMITY;  Surgeon: Darron Deatrice LABOR, MD;  [facility]: [facility];  Service: Cardiovascular;  Laterality: N/A;  Lt leg   ABDOMINAL AORTOGRAM W/LOWER EXTREMITY N/A 08/01/2020   Procedure: ABDOMINAL AORTOGRAM W/LOWER EXTREMITY;  Surgeon: Court Dorn PARAS, MD;  [facility];  Service: Cardiovascular;  Laterality: N/A;   ABDOMINAL AORTOGRAM W/LOWER EXTREMITY N/A 04/28/2021   Procedure: ABDOMINAL AORTOGRAM W/LOWER EXTREMITY;  Surgeon: Harvey Carlin BRAVO, MD;  Location: [facility]  INVASIVE CV LAB;  Service: Cardiovascular;  Laterality: N/A;   ABDOMINAL AORTOGRAM W/LOWER EXTREMITY N/A 10/13/2021   Procedure: ABDOMINAL AORTOGRAM W/LOWER EXTREMITY;  Surgeon: Magda Debby SAILOR, MD;  [facility];  Service: Cardiovascular;  Laterality: N/A;   ABDOMINAL AORTOGRAM W/LOWER EXTREMITY N/A 11/09/2022   Procedure: ABDOMINAL  AORTOGRAM W/LOWER EXTREMITY;  Surgeon: Magda Debby SAILOR, MD;  [facility];  Service: Cardiovascular;  Laterality: N/A;   ABDOMINAL AORTOGRAM W/LOWER EXTREMITY N/A 11/16/2022   Procedure: ABDOMINAL AORTOGRAM W/LOWER EXTREMITY;  Surgeon: Magda Debby SAILOR, MD;  [facility];  Service: Cardiovascular;  Laterality: N/A;   AMPUTATION TOE Left 05/03/2021   Procedure: AMPUTATION OF THIRD LEFT  TOE;  Surgeon: Magda Debby SAILOR, MD;  Location: [facility] OR;  Service: Vascular;  Laterality: Left;   APPENDECTOMY     APPLICATION OF WOUND VAC Right 07/21/2019   Procedure: Application Of Prevena Wound Vac Right Groin;  Surgeon: Serene Gaile ORN, MD;  Location: [facility] OR;  Service: Vascular;  Laterality: Right;   BILIARY BRUSHING  04/07/2023   Procedure: BILIARY BRUSHING;  Surgeon: Rosalie Kitchens, MD;  Location: [facility] ENDOSCOPY;  Service: Gastroenterology;;   BILIARY STENT PLACEMENT  04/07/2023   Procedure: BILIARY STENT PLACEMENT;  Surgeon: Rosalie Kitchens, MD;  Location: [facility] ENDOSCOPY;  Service: Gastroenterology;;   CARPAL TUNNEL RELEASE Left    CARPAL TUNNEL RELEASE Right    CESAREAN SECTION     x 2   CHOLECYSTECTOMY N/A 10/12/2022   Procedure: LAPAROSCOPIC PARTIAL FENESTRATING CHOLECYSTECTOMY;  Surgeon: Dasie Leonor CROME, MD;  [facility];  Service: General;  Laterality: N/A;   COLONOSCOPY     CORONARY ARTERY BYPASS GRAFT N/A 03/24/2020   Procedure: CORONARY ARTERY BYPASS GRAFTING (CABG) using LIMA to LAD (m); RIMA to RAMUS; Endoscopic Right Greater Saphenous Vein: SVG to Diag1; SVG to PLB (right); and SVG to PL (left).;  Surgeon: German Bartlett PEDLAR, MD;  [facility];  Service: Open Heart Surgery;  Laterality: N/A;  BILATERAL IMA   ENDARTERECTOMY FEMORAL Right 07/21/2019   Procedure: RIGHT ENDARTERECTOMY FEMORAL WITH PATCH ANGIOPLASTY;  Surgeon: Serene Gaile ORN, MD;  [facility];  Service: Vascular;  Laterality: Right;   ENDARTERECTOMY FEMORAL Right 08/16/2020   Procedure: RIGHT FEMORAL  ENDARTERECTOMY;  Surgeon: Sheree Penne Bruckner, MD;  Location: [facility] OR;  Service: Vascular;  Laterality: Right;   ENDOVEIN HARVEST OF GREATER SAPHENOUS VEIN Right 03/24/2020   Procedure: ENDOVEIN HARVEST OF GREATER SAPHENOUS VEIN;  Surgeon: German Bartlett PEDLAR, MD;  [facility];  Service: Open Heart Surgery;  Laterality: Right;   ERCP N/A 04/07/2023   Procedure: ENDOSCOPIC RETROGRADE CHOLANGIOPANCREATOGRAPHY (ERCP);  Surgeon: Rosalie Kitchens, MD;  Location: [facility] ENDOSCOPY;  Service: Gastroenterology;  Laterality: N/A;   EYE SURGERY     cataract removal bilaterally   FEMORAL-POPLITEAL BYPASS GRAFT Right 08/16/2020   Procedure: BYPASS GRAFT FEMORAL-POPLITEAL ARTERY;  Surgeon: Sheree Penne Bruckner, MD;  Location: [facility] OR;  Service: Vascular;  Laterality: Right;   FEMORAL-POPLITEAL BYPASS GRAFT Left 05/03/2021   Procedure: LEFT FEMORAL TO BELOW KNEE POPLITEAL ARTERY BYPASS GRAFTING WITH 6MMX80 PTFE GRAFT;  Surgeon: Magda Debby SAILOR, MD;  [facility];  Service: Vascular;  Laterality: Left;   LEFT HEART CATH AND CORONARY ANGIOGRAPHY N/A 03/22/2020   Procedure: LEFT HEART CATH AND CORONARY ANGIOGRAPHY;  Surgeon: Swazil[place], Peter M, MD;  Location: [facility] INVASIVE CV LAB;  Service: Cardiovascular;  Laterality: N/A;   LOWER EXTREMITY ANGIOGRAPHY Bilateral 06/15/2019   Procedure: Lower Extremity Angiography;  Surgeon: Court Dorn PARAS, MD;  Location: [facility] INVASIVE CV LAB;  Service: Cardiovascular;  Laterality: Bilateral;   LOWER EXTREMITY ANGIOGRAPHY Right 08/03/2019   Procedure: LOWER EXTREMITY ANGIOGRAPHY;  Surgeon: Court Dorn PARAS, MD;  [facility];  Service: Cardiovascular;  Laterality: Right;   LOWER EXTREMITY ANGIOGRAPHY N/A 09/07/2021   Procedure: LOWER EXTREMITY ANGIOGRAPHY;  Surgeon: Gretta Lonni PARAS, MD;  [facility];  Service: Cardiovascular;  Laterality: N/A;   PATCH ANGIOPLASTY Right 07/21/2019   Procedure: Patch Angioplasty Right Femoral Artery;  Surgeon:  Serene Gaile ORN, MD;  Location: [facility] OR;  Service: Vascular;  Laterality: Right;   PERIPHERAL INTRAVASCULAR LITHOTRIPSY  11/25/2019   Procedure: INTRAVASCULAR LITHOTRIPSY;  Surgeon: Darron Deatrice LABOR, MD;  [facility];  Service: Cardiovascular;;  LT. SFA   PERIPHERAL VASCULAR ATHERECTOMY  08/03/2019   Procedure: PERIPHERAL VASCULAR ATHERECTOMY;  Surgeon: Court Dorn PARAS, MD;  Location: [facility] INVASIVE CV LAB;  Service: Cardiovascular;;  right SFA, right TP trunk   PERIPHERAL VASCULAR ATHERECTOMY  11/25/2019   Procedure: PERIPHERAL VASCULAR ATHERECTOMY;  Surgeon: Darron Deatrice LABOR, MD;  [facility];  Service: Cardiovascular;;  Lt.  POPLITEAL and AT   PERIPHERAL VASCULAR BALLOON ANGIOPLASTY Left 06/15/2019   Procedure: PERIPHERAL VASCULAR BALLOON ANGIOPLASTY;  Surgeon: Court Dorn PARAS, MD;  [facility];  Service: Cardiovascular;  Laterality: Left;  SFA UNSUCCESSFUL UNABLE TO CROSS LESION   PERIPHERAL VASCULAR BALLOON ANGIOPLASTY  08/03/2019   Procedure: PERIPHERAL VASCULAR BALLOON ANGIOPLASTY;  Surgeon: Court Dorn PARAS, MD;  [facility];  Service: Cardiovascular;;  right SFA, Right TP trunk   PERIPHERAL VASCULAR BALLOON ANGIOPLASTY  09/07/2021   Procedure: PERIPHERAL VASCULAR BALLOON ANGIOPLASTY;  Surgeon: Gretta Lonni PARAS, MD;  [facility];  Service: Cardiovascular;;   PERIPHERAL VASCULAR BALLOON ANGIOPLASTY Right 10/13/2021   Procedure: PERIPHERAL VASCULAR BALLOON ANGIOPLASTY;  Surgeon: Magda Debby SAILOR, MD;  [facility];  Service: Cardiovascular;  Laterality: Right;   PERIPHERAL VASCULAR BALLOON ANGIOPLASTY  11/09/2022   Procedure: PERIPHERAL VASCULAR BALLOON ANGIOPLASTY;  Surgeon: Magda Debby SAILOR, MD;  [facility];  Service: Cardiovascular;;  fem-pop bypass and AT   PORTACATH PLACEMENT Left 04/29/2023   Procedure: INSERTION PORT-A-CATH;  Surgeon: Dasie Leonor CROME, MD;   Location: [facility] ORS;  Service: General;  Laterality: Left;   PORTACATH PLACEMENT N/A 07/12/2023   Procedure: REVISION PORT-A-CATH;  Surgeon: Dasie Leonor CROME, MD;  Location: [facility] OR;  Service: General;  Laterality: N/A;   REMOVAL OF STONES  04/07/2023   Procedure: REMOVAL OF STONES;  Surgeon: Rosalie Kitchens, MD;  Location: [facility] ENDOSCOPY;  Service: Gastroenterology;;   ANNETT  04/07/2023   Procedure: ANNETT;  Surgeon: Rosalie Kitchens, MD;  Location: [facility] ENDOSCOPY;  Service: Gastroenterology;;   TEE WITHOUT CARDIOVERSION N/A 03/24/2020   Procedure: TRANSESOPHAGEAL ECHOCARDIOGRAM (TEE);  Surgeon: German Bartlett PEDLAR, MD;  Location: [facility] OR;  Service: Open Heart Surgery;  Laterality: N/A;   TONSILLECTOMY     and adenoidectomy   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/2019   Procedure: TRANSMETATARSAL AMPUTATION;  Surgeon: Gershon Donnice SAUNDERS, DPM;  Location[facility] OR;  Service: Podiatry;  Laterality: Right;   TRANSMETATARSAL AMPUTATION Left 05/11/2021   Procedure: LEFT TRANSMETATARSAL AMPUTATION;  Surgeon: Sheree Penne Lonni, MD;  Location: [facility] OR;  Service: Vascular;  Laterality: Left;   TUBAL LIGATION      Prior to Admission medications   Medication Sig Start Date End Date Taking?  Authorizing Provider  acetaminophen  (TYLENOL ) 500 MG tablet Take 1,000 mg by mouth every 6 (six) hours as needed for moderate pain (pain score 4-6), fever or mild pain (pain score 1-3).   Yes [provider]  brimonidine  (ALPHAGAN ) 0.2 % ophthalmic solution Place 1 drop into the right eye 2 (two) times daily. 01/19/21  Yes [provider]  ferrous sulfate  325 (65 FE) MG EC tablet Take 1 tablet (325 mg total) by mouth daily before breakfast. To be taken with a cup of orange juice on an otherwise empty stomach if possible 04/16/24 04/16/25 Yes Shalhoub, Zachary PARAS, MD  furosemide  (LASIX ) 20 MG tablet Take 20 mg by mouth daily. 05/21/24  Yes [provider]  hydrochlorothiazide  (HYDRODIURIL ) 12.5 MG  tablet Take 12.5 mg by mouth daily.   Yes [provider]  JARDIANCE  25 MG TABS tablet Take 25 mg by mouth daily. 04/20/24  Yes [provider]  magnesium  oxide (MAG-OX) 400 (240 Mg) MG tablet Take 400 mg by mouth 2 (two) times daily.   Yes [provider]  Metformin  HCl 500 MG/5ML SOLN Take 10 mLs by mouth in the morning and at bedtime. 04/20/24  Yes [provider]  metoprolol  tartrate (LOPRESSOR ) 25 MG tablet Take 1 tablet (25 mg total) by mouth 2 (two) times daily. 02/18/24  Yes Krishnan, Gokul, MD  mirtazapine  (REMERON ) 15 MG tablet Take 15 mg by mouth at bedtime.   Yes [provider]  pantoprazole  (PROTONIX ) 40 MG tablet Take 1 tablet (40 mg total) by mouth 2 (two) times daily. 05/01/24 05/01/25 Yes Adhikari, Amrit, MD  polyethylene glycol (MIRALAX  / GLYCOLAX ) 17 g packet Take 17 g by mouth daily. 02/19/24  Yes Krishnan, Gokul, MD  Probiotic Product (PROBIOTIC PO) Take 1 capsule by mouth daily.   Yes [provider]  Propylene Glycol (SYSTANE BALANCE) 0.6 % SOLN Place 1 drop into the left eye 3 (three) times daily as needed (dry/irritated eyes).   Yes [provider]  rosuvastatin  (CRESTOR ) 40 MG tablet Take 40 mg by mouth at bedtime. 05/01/24  Yes [provider]  telmisartan  (MICARDIS ) 40 MG tablet Take 40 mg by mouth daily. 03/27/24  Yes [provider]  traZODone  (DESYREL ) 100 MG tablet Take 100 mg by mouth at bedtime. 03/12/24  Yes [provider]  XARELTO  10 MG TABS tablet Take 10 mg by mouth daily. 05/13/24  Yes [provider]  doxycycline  (VIBRA -TABS) 100 MG tablet Take 1 tablet (100 mg total) by mouth every 12 (twelve) hours. Patient not taking: Reported on 05/23/2024 04/16/24   Kenard Zachary PARAS, MD  feeding supplement (ENSURE ENLIVE / ENSURE PLUS) LIQD Take 237 mLs by mouth 2 (two) times daily between meals. Patient not taking: Reported on 05/23/2024 02/18/24   Krishnan, Gokul, MD  meropenem  1 g in  sodium chloride  0.9 % 100 mL Inject 1 g into the vein every 12 (twelve) hours. Patient not taking: Reported on 05/23/2024 04/16/24   Kenard Zachary PARAS, MD    Current Facility-Administered Medications  Medication Dose Route Frequency Provider Last Rate Last Admin   0.9 %  sodium chloride  infusion (Manually program via Guardrails IV Fluids)   Intravenous Once Palumbo, April, MD       pantoprazole  (PROTONIX ) injection 80 mg  80 mg Intravenous Once Palumbo, April, MD       sodium chloride  0.9 % bolus 500 mL  500 mL Intravenous Once Palumbo, April, MD        Allergies as  of 05/22/2024 - Review Complete 05/22/2024  Allergen Reaction Noted   Versed  [midazolam ] Anxiety 08/06/2019   Bactrim  [sulfamethoxazole -trimethoprim ] Other (See Comments) 08/06/2019   Augmentin [amoxicillin-pot clavulanate] Other (See Comments) 11/14/2021   Demerol [meperidine]  03/30/2013   Meperidine hcl  01/29/2022   Scopolamine  05/12/2014   Tramadol  Other (See Comments) 04/27/2021    Family History  Problem Relation Age of Onset   Cancer - Prostate Father    Cancer - Colon Father    Stroke Mother    Hypertension Mother    Hyperlipidemia Mother    Melanoma Brother     Social History   Socioeconomic History   Marital status: Widowed    Spouse name: Not on file   Number of children: 3   Years of education: Not on file   Highest education level: Not on file  Occupational History   Not on file  Tobacco Use   Smoking status: Former    Current packs/day: 0.00    Types: Cigarettes    Quit date: 03/31/1972    Years since quitting: 52.1   Smokeless tobacco: Never  Vaping Use   Vaping status: Never Used  Substance and Sexual Activity   Alcohol  use: No   Drug use: No   Sexual activity: Not on file  Other Topics Concern   Not on file  Social History Narrative   Not on file   Social Drivers of Health   Financial Resource Strain: Not on file  Food Insecurity: No Food Insecurity (05/23/2024)   Hunger Vital  Sign    Worried About Running Out of Food in the Last Year: Never true    Ran Out of Food in the Last Year: Never true  Transportation Needs: No Transportation Needs (05/23/2024)   PRAPARE - Administrator, Civil Service (Medical): No    Lack of Transportation (Non-Medical): No  Physical Activity: Not on file  Stress: Not on file  Social Connections: Moderately Isolated (05/23/2024)   Social Connection and Isolation Panel    Frequency of Communication with Friends and Family: More than three times a week    Frequency of Social Gatherings with Friends and Family: Once a week    Attends Religious Services: Never    Database administrator or Organizations: Yes    Attends Banker Meetings: Never    Marital Status: Widowed  Intimate Partner Violence: Not At Risk (05/23/2024)   Humiliation, Afraid, Rape, and Kick questionnaire    Fear of Current or Ex-Partner: No    Emotionally Abused: No    Physically Abused: No    Sexually Abused: No    Review of Systems: As per HPI.  Physical Exam: Vital signs in last 24 hours: Temp:  [97.6 F (36.4 C)-98.4 F (36.9 C)] 98.4 F (36.9 C) (08/30 0813) Pulse Rate:  [92-113] 104 (08/30 0813) Resp:  [12-20] 17 (08/30 0813) BP: (103-142)/(41-93) 123/49 (08/30 0813) SpO2:  [93 %-100 %] 100 % (08/30 0907) Weight:  [62.5 kg] 62.5 kg (08/30 0907)    General:   Alert,  Well-developed, well-nourished, pleasant and cooperative in NAD Head: Loss of hair Eyes:  Sclera clear, no icterus.   Prominent pallor Ears:  Normal auditory acuity. Nose:  No deformity, discharge,  or lesions. Mouth:  No deformity or lesions.  Oropharynx pink & moist. Neck:  Supple; no masses or thyromegaly. Lungs:  Clear throughout to auscultation.   No wheezes, crackles, or rhonchi. No acute distress. Heart:  Regular rate  and rhythm; no murmurs, clicks, rubs,  or gallops. Extremities:  Without clubbing or edema. Neurologic:  Alert and  oriented x4;  grossly  normal neurologically. Skin:  Intact without significant lesions or rashes. Psych:  Alert and cooperative. Normal mood and affect. Abdomen:  Soft, nontender and nondistended. No masses, hepatosplenomegaly or hernias noted. Normal bowel sounds, without guarding, and without rebound.     Rectal exam(patient's nurse Graylin, present as chaperone): External hemorrhoids, skin tags, normal anal sphincter tone, gloved finger stained with dark green stool, no obvious blood noted.    Lab Results: Recent Labs    05/22/24 2047  WBC 12.3*  HGB 6.6*  HCT 20.5*  PLT 228   BMET Recent Labs    05/22/24 2047  NA 141  K 4.1  CL 106  CO2 22  GLUCOSE 137*  BUN 35*  CREATININE 1.99*  CALCIUM  9.1   LFT No results for input(s): PROT, ALBUMIN , AST, ALT, ALKPHOS, BILITOT, BILIDIR, IBILI in the last 72 hours. PT/INR Recent Labs    05/22/24 2047  LABPROT 41.3*  INR 4.1*    Studies/Results: No results found.  Impression: Anemia-multifactorial, has sacral ulcer with chronic oozing, is on Xarelto , has gallbladder cancer with metastases(T. bili 0.4/AST 53/ALT 40/ALP 347, CA 19-9 4239), was on chemotherapy and radiation  Hemoglobin 6.6 on admission, has been given 2 units of PRBC transfusion  No evidence of hematochezia or melena, fecal occult blood positive Small amount of blood noted on rectal exam by ER could be related to hemorrhoids She was given Protonix  80 mg IV x 1, this can be discontinued.  Renal impairment, BUN 35, creatinine 1.99, GFR 25 Low albumin  2, low total protein 4.2 PT 41.3/INR 4.1 on Xarelto    Plan: Patient does not need any endoscopic intervention.  Patient's INR is high, there is no active bleeding, she may need to hold Xarelto  for the next 24-48 hrs, until INR normalizes and then resume it thereafter, to be managed by primary team.  She has been started on regular diet.  She can be discharged back to skilled nursing facility.  Please recall GI  if needed.   LOS: 0 days   [name], MD  05/23/2024, 9:50 AM

## 2024-05-23 NOTE — Discharge Summary (Signed)
 Physician Discharge Summary  [name] FMW:[id number] DOB: 1941-09-15 DOA: 05/22/2024  PCP: Chrystal Lamarr RAMAN, MD  Admit date: 05/22/2024 Discharge date: 05/23/2024  Admitted From: Skilled nursing facility  Discharge disposition: Skilled nursing facility   Recommendations for Outpatient Follow-Up:   Follow up with your primary care provider at the skilled nursing facility in 3 to 5 days Check CBC, BMP, magnesium  in the next visit   Discharge Diagnosis:   Principal Problem:   Symptomatic anemia  Discharge Condition: Improved.  Diet recommendation: Low sodium, heart healthy.  Carbohydrate-modified.    Wound care: None.  Code status: Full.   History of Present Illness:   Patient is a 83 year old female chronic normocytic anemia, CAD, CABG, paroxysmal atrial fibrillation on Xarelto , coronary artery disease, peripheral vascular disease status post angioplasty, hyperlipidemia, CKD 3, DM type II, GERD, hyperlipidemia, hypertension, cholangiocarcinoma stage IV, history of pressure ulcers, sacral osteomyelitis, chronic thrombocytopenia presented to hospital with concerns for low hemoglobin bleeding during routine checkup today.  Hemoglobin was noted to be 6.2.  Patient denies any active GI bleed but is on Xarelto .  In the ED, patient was hemodynamically stable.  Hemoglobin was 6.6 with WBC at 12.3 likely at baseline.  Platelets were 228.  Creatinine was elevated at 1.99 baseline around 1.3-1.7.  In the ED 2 units of PRBC were ordered and patient was considered for admission to hospital for acute on chronic anemia with AKI.   Hospital Course:   Following conditions were addressed during hospitalization as listed below,  Acute on chronic anemia.   Has received transfusions in the past.  Hemoglobin on presentation at 6.6.  FOBT was positive.  Patient has multiple reasons for this including cholangiocarcinoma, history of hemorrhage.  Seen by GI and likely hemorrhoidal.  No  further intervention planned.  Has received 2 units of packed RBC.  Patient feels much better at this time.  Hemoglobin prior to discharge was 9.1.  History of CAD peripheral vascular disease s/p angioplasty.  On Xarelto  at home.  Will continue.  History of pressure ulcers.    Continue wound care at the facility.  Mild AKI on stage III.  Creatinine 1.9.  Baseline around 1.1-1.3.  Creatinine 1 week back was 1.7.  Will need to monitor as outpatient.  Received 2 units of packed RBC.  History of cholangiocarcinoma stage IV.  Status post stent.  Outpatient follow-up recommended.   Disposition.  At this time, patient is stable for disposition back to skilled nursing facility  Medical Consultants:   GI  Procedures:    PRBC transfusion 2 units Subjective:   Today, patient was seen and examined at bedside.  Feels better than when she came in.  Denies other complaints.  Wishes to go back to the facility.  Discharge Exam:   Vitals:   05/23/24 0813 05/23/24 0907  BP: (!) 123/49   Pulse: (!) 104   Resp: 17   Temp: 98.4 F (36.9 C)   SpO2: 100% 100%   Vitals:   05/23/24 0730 05/23/24 0732 05/23/24 0813 05/23/24 0907  BP: 116/61 116/61 (!) 123/49   Pulse: (!) 102 (!) 101 (!) 104   Resp: 20 17 17    Temp:  98.1 F (36.7 C) 98.4 F (36.9 C)   TempSrc:  Oral Oral   SpO2:  100% 100% 100%  Weight:    62.5 kg  Height:    5' 5 (1.651 m)   Body mass index is 22.93 kg/m.  General: Alert awake, not in  obvious distress HENT: pupils equally reacting to light, mild pallor noted.   Oral mucosa is moist.  Chest:  Clear breath sounds.  Diminished breath sounds bilaterally. No crackles or wheezes.  Left chest wall port in place CVS: S1 &S2 heard. No murmur.  Regular rate and rhythm. Abdomen: Soft, nontender, nondistended.  Bowel sounds are heard.   Extremities: No cyanosis, clubbing or edema.  Peripheral pulses are palpable. Psych: Alert, awake and oriented, normal mood CNS:  No cranial  nerve deficits.  Power equal in all extremities.   Skin: Warm and dry.  Decubitus ulceration present.  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     Diagnostic Studies:   No results found.   Labs:   Basic Metabolic Panel: Recent Labs  Lab 05/18/24 0841 05/22/24 2047  NA 139 141  K 4.1 4.1  CL 106 106  CO2 26 22  GLUCOSE 172* 137*  BUN 35* 35*  CREATININE 1.94* 1.99*  CALCIUM  7.9* 9.1   GFR Estimated Creatinine Clearance: 19.6 mL/min (A) (by C-G formula based on SCr of 1.99 mg/dL (H)). Liver Function Tests: Recent Labs  Lab 05/18/24 0841  AST 53*  ALT 44  ALKPHOS 347*  BILITOT 0.4  PROT 4.2*  ALBUMIN  2.0*   No results for input(s): LIPASE, AMYLASE in the last 168 hours. No results for input(s): AMMONIA in the last 168 hours. Coagulation profile Recent Labs  Lab 05/22/24 2047  INR 4.1*    CBC: Recent Labs  Lab 05/18/24 0841 05/22/24 2047 05/23/24 0927  WBC 9.5 12.3* 11.6*  NEUTROABS 7.7 9.8*  --   HGB 8.0* 6.6* 9.1*  HCT 24.1* 20.5* 27.5*  MCV 91.3 97.2 89.9  PLT 212 228 196   Cardiac Enzymes: No results for input(s): CKTOTAL, CKMB, CKMBINDEX, TROPONINI in the last 168 hours. BNP: Invalid input(s): POCBNP CBG: No results for input(s): GLUCAP in the last 168 hours. D-Dimer No results for input(s): DDIMER in the last 72 hours. Hgb A1c No results for input(s): HGBA1C in the last 72 hours. Lipid Profile No results for input(s): CHOL, HDL, LDLCALC, TRIG, CHOLHDL, LDLDIRECT in the last 72 hours. Thyroid  function studies No results for input(s): TSH, T4TOTAL, T3FREE, THYROIDAB in the last 72 hours.  Invalid input(s): FREET3 Anemia work up No results for input(s): VITAMINB12, FOLATE, FERRITIN, TIBC, IRON , RETICCTPCT in the last 72  hours. Microbiology No results found for this or any previous visit (from the past 240 hours).   Discharge Instructions:   Discharge Instructions     Diet Carb Modified   Complete by: As directed    Discharge instructions   Complete by: As directed    Follow up with your primary care provider at the SNF in 3-5 days. Check blood work at that time.   Increase activity slowly   Complete by: As directed       Allergies as of 05/23/2024       Reactions   Versed  [midazolam ] Anxiety   Frantic, out of my mind, agitated    Bactrim  [sulfamethoxazole -trimethoprim ] Other (See Comments)   ^ K+( elevated)    Augmentin [amoxicillin-pot Clavulanate] Other (See Comments)   Elevates potassium level   Demerol [meperidine]    Delusional    Meperidine Hcl    Other reaction(s): delusional   Scopolamine    Delusional  Patch *   Tramadol  Other (See Comments)   Keeps  awake        Medication List     STOP taking these medications    doxycycline  100 MG tablet Commonly known as: VIBRA -TABS   feeding supplement Liqd   meropenem  1 g in sodium chloride  0.9 % 100 mL       TAKE these medications    acetaminophen  500 MG tablet Commonly known as: TYLENOL  Take 1,000 mg by mouth every 6 (six) hours as needed for moderate pain (pain score 4-6), fever or mild pain (pain score 1-3).   brimonidine  0.2 % ophthalmic solution Commonly known as: ALPHAGAN  Place 1 drop into the right eye 2 (two) times daily.   ferrous sulfate  325 (65 FE) MG EC tablet Take 1 tablet (325 mg total) by mouth daily before breakfast. To be taken with a cup of orange juice on an otherwise empty stomach if possible   furosemide  20 MG tablet Commonly known as: LASIX  Take 20 mg by mouth daily.   hydrochlorothiazide  12.5 MG tablet Commonly known as: HYDRODIURIL  Take 12.5 mg by mouth daily.   Jardiance  25 MG Tabs tablet Generic drug: empagliflozin  Take 25 mg by mouth daily.   magnesium  oxide 400 (240 Mg) MG  tablet Commonly known as: MAG-OX Take 400 mg by mouth 2 (two) times daily.   Metformin  HCl 500 MG/5ML Soln Take 10 mLs by mouth in the morning and at bedtime.   metoprolol  tartrate 25 MG tablet Commonly known as: LOPRESSOR  Take 1 tablet (25 mg total) by mouth 2 (two) times daily.   mirtazapine  15 MG tablet Commonly known as: REMERON  Take 15 mg by mouth at bedtime.   pantoprazole  40 MG tablet Commonly known as: Protonix  Take 1 tablet (40 mg total) by mouth 2 (two) times daily.   polyethylene glycol 17 g packet Commonly known as: MIRALAX  / GLYCOLAX  Take 17 g by mouth daily.   PROBIOTIC PO Take 1 capsule by mouth daily.   rosuvastatin  40 MG tablet Commonly known as: CRESTOR  Take 40 mg by mouth at bedtime.   Systane Balance 0.6 % Soln Generic drug: Propylene Glycol Place 1 drop into the left eye 3 (three) times daily as needed (dry/irritated eyes).   telmisartan  40 MG tablet Commonly known as: MICARDIS  Take 40 mg by mouth daily.   traZODone  100 MG tablet Commonly known as: DESYREL  Take 100 mg by mouth at bedtime.   Xarelto  10 MG Tabs tablet Generic drug: rivaroxaban  Take 10 mg by mouth daily.          Time coordinating discharge: 39 minutes  Signed:  Fahed Morten  [facility] 05/23/2024, 11:22 AM

## 2024-05-23 NOTE — ED Notes (Signed)
 Pt was assisted to restroom with sara steady

## 2024-05-23 NOTE — Plan of Care (Signed)

## 2024-05-23 NOTE — Progress Notes (Signed)
 Report called to nurse at [facility]; all questions answered; patient awaiting medical transport

## 2024-05-23 NOTE — TOC Transition Note (Signed)
 Transition of Care [facility]) - Discharge Note   Patient Details  Name: [name] MRN: [id number] Date of Birth: 01/13/1941  Transition of Care [facility]) CM/SW Contact:  Sheri ONEIDA Sharps, LCSW Phone Number: 05/23/2024, 1:13 PM   Clinical Narrative:    Pt medically ready to dc to [facility]. Call report [phone] room 1206p given to nurse. DC packet left at nurses station. PTAR called at 1pm. No further TOC needs.   Final next level of care: Skilled Nursing Facility Barriers to Discharge: No Barriers Identified   Patient Goals and CMS Choice Patient states their goals for this hospitalization and ongoing recovery are:: Return to [facility].gov Compare Post Acute Care list provided to:: Patient Choice offered to / list presented to : Patient [place] ownership interest in [facility].provided to:: Patient    Discharge Placement              Patient chooses bed at: [facility] Patient to be transferred to facility by: PT[facility] Name of family member notified: patient Patient and family notified of of transfer: 05/23/24  Discharge Plan and Services Additional resources added to the After Visit Summary for                  DME Arranged: N/A DME Agency: NA       HH Arranged: NA HH Agency: NA        Social Drivers of Health (SDOH) Interventions SDOH Screenings   Food Insecurity: No Food Insecurity (05/23/2024)  Housing: Low Risk  (05/23/2024)  Transportation Needs: No Transportation Needs (05/23/2024)  Utilities: Not At Risk (05/23/2024)  Depression (PHQ2-9): Low Risk  (05/18/2024)  Social Connections: Moderately Isolated (05/23/2024)  Tobacco Use: Medium Risk (05/23/2024)     Readmission Risk Interventions    04/16/2024    3:13 PM  Readmission Risk Prevention Plan  Transportation Screening Complete  PCP or Specialist Appt within 3-5 Days Complete  HRI or Home Care Consult Complete  Social Work Consult for Recovery Care Planning/Counseling Complete   Medication Review [facility]) Referral to Pharmacy

## 2024-05-23 NOTE — Progress Notes (Signed)
 Two attempts made to call report at [facility], [phone]; no answer; SW made aware

## 2024-05-23 NOTE — Discharge Instructions (Addendum)
 You have been seen and discharged from the emergency department.  You were found to be anemic.  You were transfused here in the emergency department.  Follow-up with your primary provider for further evaluation and further care. Take home medications as prescribed. If you have any worsening symptoms or further concerns for your health please return to an emergency department for further evaluation.

## 2024-05-25 DIAGNOSIS — L89154 Pressure ulcer of sacral region, stage 4: Secondary | ICD-10-CM | POA: Diagnosis not present

## 2024-05-25 DIAGNOSIS — M4628 Osteomyelitis of vertebra, sacral and sacrococcygeal region: Secondary | ICD-10-CM | POA: Diagnosis not present

## 2024-05-25 DIAGNOSIS — R609 Edema, unspecified: Secondary | ICD-10-CM | POA: Diagnosis not present

## 2024-05-25 DIAGNOSIS — M6259 Muscle wasting and atrophy, not elsewhere classified, multiple sites: Secondary | ICD-10-CM | POA: Diagnosis not present

## 2024-05-25 DIAGNOSIS — J189 Pneumonia, unspecified organism: Secondary | ICD-10-CM | POA: Diagnosis not present

## 2024-05-25 DIAGNOSIS — Z8719 Personal history of other diseases of the digestive system: Secondary | ICD-10-CM | POA: Diagnosis not present

## 2024-05-25 DIAGNOSIS — E1122 Type 2 diabetes mellitus with diabetic chronic kidney disease: Secondary | ICD-10-CM | POA: Diagnosis not present

## 2024-05-25 DIAGNOSIS — I480 Paroxysmal atrial fibrillation: Secondary | ICD-10-CM | POA: Diagnosis not present

## 2024-05-25 DIAGNOSIS — Z794 Long term (current) use of insulin: Secondary | ICD-10-CM | POA: Diagnosis not present

## 2024-05-25 DIAGNOSIS — E1165 Type 2 diabetes mellitus with hyperglycemia: Secondary | ICD-10-CM | POA: Diagnosis not present

## 2024-05-25 DIAGNOSIS — L89156 Pressure-induced deep tissue damage of sacral region: Secondary | ICD-10-CM | POA: Diagnosis not present

## 2024-05-25 DIAGNOSIS — K644 Residual hemorrhoidal skin tags: Secondary | ICD-10-CM | POA: Diagnosis not present

## 2024-05-25 DIAGNOSIS — R1311 Dysphagia, oral phase: Secondary | ICD-10-CM | POA: Diagnosis not present

## 2024-05-25 DIAGNOSIS — R2689 Other abnormalities of gait and mobility: Secondary | ICD-10-CM | POA: Diagnosis not present

## 2024-05-25 DIAGNOSIS — M6281 Muscle weakness (generalized): Secondary | ICD-10-CM | POA: Diagnosis not present

## 2024-05-25 DIAGNOSIS — C23 Malignant neoplasm of gallbladder: Secondary | ICD-10-CM | POA: Diagnosis not present

## 2024-05-25 DIAGNOSIS — Z8739 Personal history of other diseases of the musculoskeletal system and connective tissue: Secondary | ICD-10-CM | POA: Diagnosis not present

## 2024-05-25 DIAGNOSIS — Z7984 Long term (current) use of oral hypoglycemic drugs: Secondary | ICD-10-CM | POA: Diagnosis not present

## 2024-05-25 DIAGNOSIS — E113393 Type 2 diabetes mellitus with moderate nonproliferative diabetic retinopathy without macular edema, bilateral: Secondary | ICD-10-CM | POA: Diagnosis not present

## 2024-05-25 DIAGNOSIS — I2510 Atherosclerotic heart disease of native coronary artery without angina pectoris: Secondary | ICD-10-CM | POA: Diagnosis not present

## 2024-05-25 DIAGNOSIS — Z95828 Presence of other vascular implants and grafts: Secondary | ICD-10-CM | POA: Diagnosis not present

## 2024-05-25 DIAGNOSIS — N183 Chronic kidney disease, stage 3 unspecified: Secondary | ICD-10-CM | POA: Diagnosis not present

## 2024-05-25 DIAGNOSIS — Z7901 Long term (current) use of anticoagulants: Secondary | ICD-10-CM | POA: Diagnosis not present

## 2024-05-25 DIAGNOSIS — R41841 Cognitive communication deficit: Secondary | ICD-10-CM | POA: Diagnosis not present

## 2024-05-25 DIAGNOSIS — D631 Anemia in chronic kidney disease: Secondary | ICD-10-CM | POA: Diagnosis not present

## 2024-05-25 DIAGNOSIS — D8481 Immunodeficiency due to conditions classified elsewhere: Secondary | ICD-10-CM | POA: Diagnosis not present

## 2024-05-26 ENCOUNTER — Encounter: Payer: Self-pay | Admitting: Pulmonary Disease

## 2024-05-26 ENCOUNTER — Encounter: Payer: Self-pay | Admitting: Hematology

## 2024-05-26 ENCOUNTER — Other Ambulatory Visit: Payer: Self-pay | Admitting: Hematology

## 2024-05-26 DIAGNOSIS — I480 Paroxysmal atrial fibrillation: Secondary | ICD-10-CM | POA: Diagnosis not present

## 2024-05-26 DIAGNOSIS — D631 Anemia in chronic kidney disease: Secondary | ICD-10-CM | POA: Diagnosis not present

## 2024-05-26 DIAGNOSIS — N183 Chronic kidney disease, stage 3 unspecified: Secondary | ICD-10-CM | POA: Diagnosis not present

## 2024-05-26 DIAGNOSIS — E1122 Type 2 diabetes mellitus with diabetic chronic kidney disease: Secondary | ICD-10-CM | POA: Diagnosis not present

## 2024-05-26 DIAGNOSIS — Z7984 Long term (current) use of oral hypoglycemic drugs: Secondary | ICD-10-CM | POA: Diagnosis not present

## 2024-05-26 DIAGNOSIS — Z7901 Long term (current) use of anticoagulants: Secondary | ICD-10-CM | POA: Diagnosis not present

## 2024-05-26 DIAGNOSIS — L89154 Pressure ulcer of sacral region, stage 4: Secondary | ICD-10-CM | POA: Diagnosis not present

## 2024-05-26 DIAGNOSIS — Z794 Long term (current) use of insulin: Secondary | ICD-10-CM | POA: Diagnosis not present

## 2024-05-26 DIAGNOSIS — I2510 Atherosclerotic heart disease of native coronary artery without angina pectoris: Secondary | ICD-10-CM | POA: Diagnosis not present

## 2024-05-26 LAB — BPAM RBC
Blood Product Expiration Date: 202509192359
Blood Product Expiration Date: 202509282359
ISSUE DATE / TIME: 202508300249
ISSUE DATE / TIME: 202508300511
Unit Type and Rh: 5100
Unit Type and Rh: 5100

## 2024-05-26 LAB — TYPE AND SCREEN
ABO/RH(D): O POS
Antibody Screen: NEGATIVE
Unit division: 0
Unit division: 0

## 2024-05-26 NOTE — Telephone Encounter (Signed)
 LVM  regarding  pt  upcoming appts.

## 2024-05-27 ENCOUNTER — Other Ambulatory Visit: Payer: Self-pay

## 2024-05-27 ENCOUNTER — Ambulatory Visit

## 2024-05-27 DIAGNOSIS — M6259 Muscle wasting and atrophy, not elsewhere classified, multiple sites: Secondary | ICD-10-CM | POA: Diagnosis not present

## 2024-05-27 DIAGNOSIS — R609 Edema, unspecified: Secondary | ICD-10-CM | POA: Diagnosis not present

## 2024-05-27 DIAGNOSIS — M6281 Muscle weakness (generalized): Secondary | ICD-10-CM | POA: Diagnosis not present

## 2024-05-27 DIAGNOSIS — M4628 Osteomyelitis of vertebra, sacral and sacrococcygeal region: Secondary | ICD-10-CM | POA: Diagnosis not present

## 2024-05-27 DIAGNOSIS — L89156 Pressure-induced deep tissue damage of sacral region: Secondary | ICD-10-CM | POA: Diagnosis not present

## 2024-05-28 DIAGNOSIS — C23 Malignant neoplasm of gallbladder: Secondary | ICD-10-CM | POA: Diagnosis not present

## 2024-05-28 DIAGNOSIS — D631 Anemia in chronic kidney disease: Secondary | ICD-10-CM | POA: Diagnosis not present

## 2024-05-28 DIAGNOSIS — Z7984 Long term (current) use of oral hypoglycemic drugs: Secondary | ICD-10-CM | POA: Diagnosis not present

## 2024-05-28 DIAGNOSIS — L89154 Pressure ulcer of sacral region, stage 4: Secondary | ICD-10-CM | POA: Diagnosis not present

## 2024-05-28 DIAGNOSIS — E1122 Type 2 diabetes mellitus with diabetic chronic kidney disease: Secondary | ICD-10-CM | POA: Diagnosis not present

## 2024-05-28 DIAGNOSIS — Z7901 Long term (current) use of anticoagulants: Secondary | ICD-10-CM | POA: Diagnosis not present

## 2024-05-28 DIAGNOSIS — Z8719 Personal history of other diseases of the digestive system: Secondary | ICD-10-CM | POA: Diagnosis not present

## 2024-05-28 DIAGNOSIS — K644 Residual hemorrhoidal skin tags: Secondary | ICD-10-CM | POA: Diagnosis not present

## 2024-05-28 DIAGNOSIS — Z8739 Personal history of other diseases of the musculoskeletal system and connective tissue: Secondary | ICD-10-CM | POA: Diagnosis not present

## 2024-05-28 DIAGNOSIS — Z95828 Presence of other vascular implants and grafts: Secondary | ICD-10-CM | POA: Diagnosis not present

## 2024-05-28 DIAGNOSIS — I480 Paroxysmal atrial fibrillation: Secondary | ICD-10-CM | POA: Diagnosis not present

## 2024-05-28 DIAGNOSIS — N183 Chronic kidney disease, stage 3 unspecified: Secondary | ICD-10-CM | POA: Diagnosis not present

## 2024-05-30 DIAGNOSIS — D500 Iron deficiency anemia secondary to blood loss (chronic): Secondary | ICD-10-CM | POA: Diagnosis not present

## 2024-05-30 DIAGNOSIS — Z7901 Long term (current) use of anticoagulants: Secondary | ICD-10-CM | POA: Diagnosis not present

## 2024-05-30 DIAGNOSIS — E113393 Type 2 diabetes mellitus with moderate nonproliferative diabetic retinopathy without macular edema, bilateral: Secondary | ICD-10-CM | POA: Diagnosis not present

## 2024-05-30 DIAGNOSIS — M1990 Unspecified osteoarthritis, unspecified site: Secondary | ICD-10-CM | POA: Diagnosis not present

## 2024-05-30 DIAGNOSIS — I129 Hypertensive chronic kidney disease with stage 1 through stage 4 chronic kidney disease, or unspecified chronic kidney disease: Secondary | ICD-10-CM | POA: Diagnosis not present

## 2024-05-30 DIAGNOSIS — Z85828 Personal history of other malignant neoplasm of skin: Secondary | ICD-10-CM | POA: Diagnosis not present

## 2024-05-30 DIAGNOSIS — L89154 Pressure ulcer of sacral region, stage 4: Secondary | ICD-10-CM | POA: Diagnosis not present

## 2024-05-30 DIAGNOSIS — Z951 Presence of aortocoronary bypass graft: Secondary | ICD-10-CM | POA: Diagnosis not present

## 2024-05-30 DIAGNOSIS — I480 Paroxysmal atrial fibrillation: Secondary | ICD-10-CM | POA: Diagnosis not present

## 2024-05-30 DIAGNOSIS — E1151 Type 2 diabetes mellitus with diabetic peripheral angiopathy without gangrene: Secondary | ICD-10-CM | POA: Diagnosis not present

## 2024-05-30 DIAGNOSIS — L89892 Pressure ulcer of other site, stage 2: Secondary | ICD-10-CM | POA: Diagnosis not present

## 2024-05-30 DIAGNOSIS — C23 Malignant neoplasm of gallbladder: Secondary | ICD-10-CM | POA: Diagnosis not present

## 2024-05-30 DIAGNOSIS — K219 Gastro-esophageal reflux disease without esophagitis: Secondary | ICD-10-CM | POA: Diagnosis not present

## 2024-05-30 DIAGNOSIS — L89513 Pressure ulcer of right ankle, stage 3: Secondary | ICD-10-CM | POA: Diagnosis not present

## 2024-05-30 DIAGNOSIS — E1140 Type 2 diabetes mellitus with diabetic neuropathy, unspecified: Secondary | ICD-10-CM | POA: Diagnosis not present

## 2024-05-30 DIAGNOSIS — N1831 Chronic kidney disease, stage 3a: Secondary | ICD-10-CM | POA: Diagnosis not present

## 2024-05-30 DIAGNOSIS — I252 Old myocardial infarction: Secondary | ICD-10-CM | POA: Diagnosis not present

## 2024-05-30 DIAGNOSIS — Z6822 Body mass index (BMI) 22.0-22.9, adult: Secondary | ICD-10-CM | POA: Diagnosis not present

## 2024-05-30 DIAGNOSIS — E43 Unspecified severe protein-calorie malnutrition: Secondary | ICD-10-CM | POA: Diagnosis not present

## 2024-05-30 DIAGNOSIS — I2510 Atherosclerotic heart disease of native coronary artery without angina pectoris: Secondary | ICD-10-CM | POA: Diagnosis not present

## 2024-05-30 DIAGNOSIS — K449 Diaphragmatic hernia without obstruction or gangrene: Secondary | ICD-10-CM | POA: Diagnosis not present

## 2024-05-30 DIAGNOSIS — Z794 Long term (current) use of insulin: Secondary | ICD-10-CM | POA: Diagnosis not present

## 2024-05-30 DIAGNOSIS — E1122 Type 2 diabetes mellitus with diabetic chronic kidney disease: Secondary | ICD-10-CM | POA: Diagnosis not present

## 2024-05-30 DIAGNOSIS — D8481 Immunodeficiency due to conditions classified elsewhere: Secondary | ICD-10-CM | POA: Diagnosis not present

## 2024-05-30 DIAGNOSIS — E785 Hyperlipidemia, unspecified: Secondary | ICD-10-CM | POA: Diagnosis not present

## 2024-06-01 ENCOUNTER — Inpatient Hospital Stay

## 2024-06-01 ENCOUNTER — Inpatient Hospital Stay (HOSPITAL_BASED_OUTPATIENT_CLINIC_OR_DEPARTMENT_OTHER): Admitting: Hematology

## 2024-06-01 ENCOUNTER — Other Ambulatory Visit: Payer: Self-pay | Admitting: *Deleted

## 2024-06-01 ENCOUNTER — Encounter: Payer: Self-pay | Admitting: Hematology

## 2024-06-01 VITALS — BP 103/41

## 2024-06-01 VITALS — BP 90/38 | HR 91 | Temp 97.9°F | Resp 17 | Ht 65.0 in | Wt 133.1 lb

## 2024-06-01 DIAGNOSIS — C23 Malignant neoplasm of gallbladder: Secondary | ICD-10-CM | POA: Insufficient documentation

## 2024-06-01 DIAGNOSIS — L89109 Pressure ulcer of unspecified part of back, unspecified stage: Secondary | ICD-10-CM | POA: Insufficient documentation

## 2024-06-01 DIAGNOSIS — C787 Secondary malignant neoplasm of liver and intrahepatic bile duct: Secondary | ICD-10-CM | POA: Insufficient documentation

## 2024-06-01 DIAGNOSIS — R600 Localized edema: Secondary | ICD-10-CM | POA: Insufficient documentation

## 2024-06-01 DIAGNOSIS — Z5112 Encounter for antineoplastic immunotherapy: Secondary | ICD-10-CM | POA: Insufficient documentation

## 2024-06-01 DIAGNOSIS — D630 Anemia in neoplastic disease: Secondary | ICD-10-CM | POA: Insufficient documentation

## 2024-06-01 LAB — CMP (CANCER CENTER ONLY)
ALT: 14 U/L (ref 0–44)
AST: 18 U/L (ref 15–41)
Albumin: 1.9 g/dL — ABNORMAL LOW (ref 3.5–5.0)
Alkaline Phosphatase: 162 U/L — ABNORMAL HIGH (ref 38–126)
Anion gap: 10 (ref 5–15)
BUN: 41 mg/dL — ABNORMAL HIGH (ref 8–23)
CO2: 23 mmol/L (ref 22–32)
Calcium: 7.7 mg/dL — ABNORMAL LOW (ref 8.9–10.3)
Chloride: 106 mmol/L (ref 98–111)
Creatinine: 1.65 mg/dL — ABNORMAL HIGH (ref 0.44–1.00)
GFR, Estimated: 31 mL/min — ABNORMAL LOW (ref >60)
Glucose, Bld: 222 mg/dL — ABNORMAL HIGH (ref 70–99)
Potassium: 3.7 mmol/L (ref 3.5–5.1)
Sodium: 139 mmol/L (ref 135–145)
Total Bilirubin: 0.4 mg/dL (ref 0.0–1.2)
Total Protein: 3.8 g/dL — ABNORMAL LOW (ref 6.5–8.1)

## 2024-06-01 LAB — CBC WITH DIFFERENTIAL (CANCER CENTER ONLY)
Abs Immature Granulocytes: 0.13 K/uL — ABNORMAL HIGH (ref 0.00–0.07)
Basophils Absolute: 0.1 K/uL (ref 0.0–0.1)
Basophils Relative: 0 %
Eosinophils Absolute: 0 K/uL (ref 0.0–0.5)
Eosinophils Relative: 0 %
HCT: 20.4 % — ABNORMAL LOW (ref 36.0–46.0)
Hemoglobin: 6.7 g/dL — CL (ref 12.0–15.0)
Immature Granulocytes: 1 %
Lymphocytes Relative: 4 %
Lymphs Abs: 0.5 K/uL — ABNORMAL LOW (ref 0.7–4.0)
MCH: 31.5 pg (ref 26.0–34.0)
MCHC: 32.8 g/dL (ref 30.0–36.0)
MCV: 95.8 fL (ref 80.0–100.0)
Monocytes Absolute: 0.9 K/uL (ref 0.1–1.0)
Monocytes Relative: 7 %
Neutro Abs: 11.3 K/uL — ABNORMAL HIGH (ref 1.7–7.7)
Neutrophils Relative %: 88 %
Platelet Count: 237 K/uL (ref 150–400)
RBC: 2.13 MIL/uL — ABNORMAL LOW (ref 3.87–5.11)
RDW: 20.7 % — ABNORMAL HIGH (ref 11.5–15.5)
WBC Count: 12.9 K/uL — ABNORMAL HIGH (ref 4.0–10.5)
nRBC: 0.2 % (ref 0.0–0.2)

## 2024-06-01 LAB — PREPARE RBC (CROSSMATCH)

## 2024-06-01 MED ORDER — SODIUM CHLORIDE 0.9 % IV SOLN: INTRAVENOUS | Status: DC

## 2024-06-01 MED ORDER — SODIUM CHLORIDE 0.9 % IV SOLN
1500.0000 mg | Freq: Once | INTRAVENOUS | Status: AC
  Administered 2024-06-01: 1500 mg via INTRAVENOUS
  Filled 2024-06-01: qty 30

## 2024-06-01 MED ORDER — XARELTO 10 MG PO TABS: 10.0000 mg | ORAL_TABLET | Freq: Every day | ORAL | 2 refills | Status: DC

## 2024-06-01 MED ORDER — FUROSEMIDE 20 MG PO TABS: 20.0000 mg | ORAL_TABLET | Freq: Every day | ORAL | 0 refills | Status: DC

## 2024-06-01 NOTE — Assessment & Plan Note (Signed)
 cT4N0M0, stage III -Patient had 1 episode of abdominal pain in October 2023, and was found to have a large gallbladder stone. -She underwent laparoscopic attempted cholecystectomy but was not able to be resected due to adhesions to duodenum and colon. -She developed jaundice in June 2024, status post ERCP and stent placement in [place].  CT and MRI scan showed a liver mass adjacent to the gallbladder fossae, and a biopsy confirmed adenocarcinoma.  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  Unfortunately this was felt to be not resectable by Dr. Dasie. -I reviewed her CT chest from April 22, 2023, which showed no evidence of distant metastasis. -I recommended first-line cisplatin , gemcitabine   every 14 days and durvalumab  every 28 days.  If she responded well, will stop chemo after 4 to 6 months of treatment, and changed to maintenance therapy. She started on 05/03/23 every 2 weeks and has been tolerating well.  Chemo changed to maintenance durvalumab  on 11/22/2023 -restaging CT 07/08/2023 showed partial response in the primary site tumor with direct invasion to liver.  No new disease or question.   -case reviewed in GI tumor board, consolidation RT is also an option. She started RT and Xeloda  on 12/09/2023 and completed on 01/15/2024 -plan to continue durvalumab , treatment held after April 2025 due to multiple hospital admissions and deconditioning

## 2024-06-01 NOTE — Patient Instructions (Signed)
 [facility] - A DEPT OF [facility]  Discharge Instructions: Thank you for choosing [facility] to provide your oncology and hematology care.   If you have a lab appointment with the Cancer Center, please go directly to the Cancer Center and check in at the registration area.   Wear comfortable clothing and clothing appropriate for easy access to any Portacath or PICC line.   We strive to give you quality time with your provider. You may need to reschedule your appointment if you arrive late (15 or more minutes).  Arriving late affects you and other patients whose appointments are after yours.  Also, if you miss three or more appointments without notifying the office, you may be dismissed from the clinic at the provider's discretion.      For prescription refill requests, have your pharmacy contact our office and allow 72 hours for refills to be completed.    Today you received the following chemotherapy and/or immunotherapy agents Durvalumab  (IMFINZI )       To help prevent nausea and vomiting after your treatment, we encourage you to take your nausea medication as directed.  BELOW ARE SYMPTOMS THAT SHOULD BE REPORTED IMMEDIATELY: *FEVER GREATER THAN 100.4 F (38 C) OR HIGHER *CHILLS OR SWEATING *NAUSEA AND VOMITING THAT IS NOT CONTROLLED WITH YOUR NAUSEA MEDICATION *UNUSUAL SHORTNESS OF BREATH *UNUSUAL BRUISING OR BLEEDING *URINARY PROBLEMS (pain or burning when urinating, or frequent urination) *BOWEL PROBLEMS (unusual diarrhea, constipation, pain near the anus) TENDERNESS IN MOUTH AND THROAT WITH OR WITHOUT PRESENCE OF ULCERS (sore throat, sores in mouth, or a toothache) UNUSUAL RASH, SWELLING OR PAIN  UNUSUAL VAGINAL DISCHARGE OR ITCHING   Items with * indicate a potential emergency and should be followed up as soon as possible or go to the Emergency Department if any problems should occur.  Please show the CHEMOTHERAPY ALERT CARD or  IMMUNOTHERAPY ALERT CARD at check-in to the Emergency Department and triage nurse.  Should you have questions after your visit or need to cancel or reschedule your appointment, please contact [facility] - A DEPT OF [facility]  Dept: [phone]  and follow the prompts.  Office hours are 8:00 a.m. to 4:30 p.m. Monday - Friday. Please note that voicemails left after 4:00 p.m. may not be returned until the following business day.  We are closed weekends and major holidays. You have access to a nurse at all times for urgent questions. Please call the main number to the clinic Dept: [phone] and follow the prompts.   For any non-urgent questions, you may also contact your provider using MyChart. We now offer e-Visits for anyone 55 and older to request care online for non-urgent symptoms. For details visit [email or web address].   Also download the MyChart app! Go to the app store, search MyChart, open the app, select [place], and log in with your MyChart username and password.

## 2024-06-01 NOTE — Progress Notes (Signed)
 CRITICAL VALUE STICKER  CRITICAL VALUE: Hgb 6.7  RECEIVER (on-site recipient of call): DOROTHA Cook RN  DATE & TIME NOTIFIED: 06/01/24 @ 1412  MESSENGER (representative from lab): Aldona  MD NOTIFIED: Dr Lanny  TIME OF NOTIFICATION: 1415  RESPONSE:  Patient to receive 2 units PRBC's

## 2024-06-01 NOTE — Progress Notes (Signed)
 [facility]   [phone] Fax:([phone]) [phone]   Clinic Follow up Note   Patient Care Team: Chrystal Lamarr RAMAN, MD as PCP - General (Family Medicine) Court Dorn PARAS, MD as PCP - Cardiology (Cardiology) Lanny Callander, MD as Consulting Physician (Oncology)  Date of Service:  06/01/2024  CHIEF COMPLAINT: f/u of gallbladder cancer, anemia  CURRENT THERAPY:  Restarting maintenance durvalumab  every 4 weeks  Oncology History   Gallbladder cancer (HCC) cT4N0M0, stage III -Patient had 1 episode of abdominal pain in October 2023, and was found to have a large gallbladder stone. -She underwent laparoscopic attempted cholecystectomy but was not able to be resected due to adhesions to duodenum and colon. -She developed jaundice in June 2024, status post ERCP and stent placement in CBD.  CT and MRI scan showed a liver mass adjacent to the gallbladder fossae, and a biopsy confirmed adenocarcinoma.  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  Unfortunately this was felt to be not resectable by Dr. Dasie. -I reviewed her CT chest from April 22, 2023, which showed no evidence of distant metastasis. -I recommended first-line cisplatin , gemcitabine   every 14 days and durvalumab  every 28 days.  If she responded well, will stop chemo after 4 to 6 months of treatment, and changed to maintenance therapy. She started on 05/03/23 every 2 weeks and has been tolerating well.  Chemo changed to maintenance durvalumab  on 11/22/2023 -restaging CT 07/08/2023 showed partial response in the primary site tumor with direct invasion to liver.  No new disease or question.   -case reviewed in GI tumor board, consolidation RT is also an option. She started RT and Xeloda  on 12/09/2023 and completed on 01/15/2024 -plan to continue durvalumab , treatment held after April 2025 due to multiple hospital  admissions and deconditioning   Assessment & Plan Gallbladder cancer Gallbladder cancer with a high tumor marker of 4000, indicating disease progression. She has been off treatment for a while, and the last scan was on April 23, 2024. 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 No evidence of active bleeding. - Administer blood transfusion, for 2 units tomorrow   Edema of upper and lower extremities Significant edema in both upper and lower extremities, with swelling noted in the hands and legs. She is currently on furosemide  20 mg once daily and hydrochlorothiazide . - Continue furosemide  20 mg once daily - Continue hydrochlorothiazide  - Administer immunotherapy with 100 cc of IV fluids  Chronic pressure ulcers of back Chronic pressure ulcers on the back, with two ulcers joined by a tunnel. The ulcers are being managed by a home health nurse and her daughter, who have been changing the dressings. The ulcers are healing. - Request home health nurse to take pictures of the ulcers during dressing changes for monitoring  Borderline hypotension -Probably related to diuretics, will hold her Lasix  for a week, and repeat lab next week. -May restart Lasix  at lower dose next week  Decubital ulcer -Continue wound care at home, I asked patient to take a picture when wound care changing her dressing.  Plan - Lab reviewed, hemoglobin 6.5, will schedule 2 units of blood transfusion tomorrow - Will proceed durvalumab  today and continue every 4 weeks - Will hold furosemide   for a week, and restarted next week at 50% reduce the dose -f/u in 4 weeks with next cycle durvalumab  -f/u with PCP      SUMMARY OF ONCOLOGIC HISTORY: Oncology History Overview Note   Cancer Staging  Gallbladder cancer (HCC) Staging  form: Distal Bile Duct, AJCC 8th Edition - Clinical stage from 04/09/2023: Stage IIIB (cT4, cN0, cM0) - Signed by Lanny Callander, MD on 04/18/2023 Total positive nodes: 0 Histologic grade (G): G2 Histologic grading system: 3 grade system     Gallbladder cancer (HCC)  04/04/2023 Imaging    IMPRESSION: 1. Ill-defined hypodensity in the region of the gallbladder fossa appears similar to the prior study given lack of contrast. This would be better evaluated with dedicated MRI. 2. Nonobstructing right renal calculus. 3. Large amount of stool throughout the colon. 4. Stable indeterminate lucent areas in the vertebral bodies of the lumbar spine. 5. Severe atherosclerotic disease.     04/09/2023 Pathology Results     FINAL MICROSCOPIC DIAGNOSIS:   A. LIVER, ADJACENT TO GALLBLADDER FOSSA, BIOPSY:  - Moderately differentiated adenocarcinoma    04/09/2023 Cancer Staging   Staging form: Distal Bile Duct, AJCC 8th Edition - Clinical stage from 04/09/2023: Stage IIIB (cT4, cN0, cM0) - Signed by Lanny Callander, MD on 04/18/2023 Total positive nodes: 0 Histologic grade (G): G2 Histologic grading system: 3 grade system   04/18/2023 Initial Diagnosis   Cholangiocarcinoma of liver (HCC)   05/03/2023 - 11/08/2023 Chemotherapy   Patient is on Treatment Plan : BILIARY TRACT Cisplatin  + Gemcitabine  D1,15 + Durvalumab  (1500) D1 q28d / Durvalumab  (1500) q28d     07/08/2023 Imaging   CT Chest abdomen and pelvis with contrast  IMPRESSION: CT CHEST IMPRESSION  1.  No acute process or evidence of metastatic disease in the chest. 2. Left Port-A-Cath tip in azygous vein. This is being adjusted by interventional radiology later today. 3. Ongoing stability of tiny bilateral pulmonary nodules, favored to be benign. 4.  Aortic Atherosclerosis (ICD10-I70.0).  CT ABDOMEN AND PELVIS IMPRESSION  1. The segment 5 liver lesion is decreased in size compared to the MRI of 04/05/2023. 2. Placement of a biliary stent with  improvement, near complete resolution of duct dilatation. 3. No abdominopelvic adenopathy. 4. Proximal gastric underdistention. Apparent wall thickening could be secondary. Correlate with symptoms of gastritis 5. Subtle findings within the superior spleen for which small volume interval splenic infarct cannot be excluded. Correlate with left upper quadrant symptoms. 6.  Possible constipation.     09/27/2023 Imaging   CT chest abdomen and pelvis with contrast  IMPRESSION: CHEST:  1. Stable small pulmonary nodules. 2. No evidence of thoracic metastasis.  PELVIS:  1. Stable hepatic metastasis. 2. No new hepatic lesions. 3. No evidence of metastatic adenopathy in the abdomen pelvis. 4. Intrahepatic and extrahepatic biliary stent in place. No biliary duct dilatation. 5.  Aortic Atherosclerosis (ICD10-I70.0).   11/22/2023 -  Chemotherapy   Patient is on Treatment Plan : LUNG NSCLC Durvalumab  (1500) q28d        Discussed the use of AI scribe software for clinical note transcription with the patient, who gave verbal consent to proceed.  History of Present Illness [name] is an 83 year old female with gallbladder cancer who presents for follow-up.  She is undergoing immunotherapy for gallbladder cancer. Her blood pressure is low at home and in the nursing home, but she experiences no dizziness or other symptoms. Hemoglobin is significantly low at 6.6 g/dL without noticeable bleeding. She attributes  this to her cancer treatment and medications affecting red blood cell production.  She experiences swelling in her extremities, including the right and left hands and legs, requiring wrapping. She takes furosemide  20 mg daily and hydrochlorothiazide  for fluid management.  Nutritional intake is reduced, with a decreased appetite noted this morning despite adequate intake over the weekend. She has a pressure sore on her back managed by a home health nurse and her daughter, with a wound  consisting of two areas joined by a tunnel. She completed a course of antibiotics around May 08, 2024, and manages medications with her primary care provider, needing refills for furosemide  and Xarelto , taken at a low dose of 10 mg.     All other systems were reviewed with the patient and are negative.  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 There is a small calcified mass in the L:A near the LA appendage. No valvular masses seen with associated mitral annular calcification. LA Volume/ BSA27.4 ml/m2 No AS. Right ventricular systolic pressure is elevated at .   Jaundice    Myocardial infarction [facility])    June 2021   Neuromuscular disorder (HCC)    neuropathy in bilateral feet   Peripheral vascular disease (HCC)    Pneumonia    not hosp.    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  PTCAs with drug coated balloon Lt ant tibial artery and Lt popliteal artery  11/26/2019    SURGICAL HISTORY: Past Surgical History:  Procedure Laterality Date   ABDOMINAL AORTAGRAM  11/25/2019   ABDOMINAL AORTOGRAM W/LOWER EXTREMITY    ABDOMINAL AORTOGRAM N/A 06/15/2019   Procedure: ABDOMINAL AORTOGRAM;  Surgeon: Court Dorn PARAS,  MD;  [facility]: [facility];  Service: Cardiovascular;  Laterality: N/A;   ABDOMINAL AORTOGRAM W/LOWER EXTREMITY N/A 11/25/2019   Procedure: ABDOMINAL AORTOGRAM W/LOWER EXTREMITY;  Surgeon: Darron Deatrice LABOR, MD;  Location: [facility];  Service: Cardiovascular;  Laterality: N/A;  Lt leg   ABDOMINAL AORTOGRAM W/LOWER EXTREMITY N/A 08/01/2020   Procedure: ABDOMINAL AORTOGRAM W/LOWER EXTREMITY;  Surgeon: Court Dorn PARAS, MD;  [facility];  Service: Cardiovascular;  Laterality: N/A;   ABDOMINAL AORTOGRAM W/LOWER EXTREMITY N/A 04/28/2021   Procedure: ABDOMINAL AORTOGRAM W/LOWER EXTREMITY;  Surgeon: Harvey Carlin BRAVO, MD;  [facility]: [facility];  Service: Cardiovascular;  Laterality: N/A;   ABDOMINAL AORTOGRAM W/LOWER EXTREMITY N/A 10/13/2021   Procedure: ABDOMINAL AORTOGRAM W/LOWER EXTREMITY;  Surgeon: Magda Debby SAILOR, MD;  [facility];  Service: Cardiovascular;  Laterality: N/A;   ABDOMINAL AORTOGRAM W/LOWER EXTREMITY N/A 11/09/2022   Procedure: ABDOMINAL  AORTOGRAM W/LOWER EXTREMITY;  Surgeon: Magda Debby SAILOR, MD;  Location: [facility] INVASIVE CV LAB;  Service: Cardiovascular;  Laterality: N/A;   ABDOMINAL AORTOGRAM W/LOWER EXTREMITY N/A 11/16/2022   Procedure: ABDOMINAL AORTOGRAM W/LOWER EXTREMITY;  Surgeon: Magda Debby SAILOR, MD;  [facility];  Service: Cardiovascular;  Laterality: N/A;   AMPUTATION TOE Left 05/03/2021   Procedure: AMPUTATION OF THIRD LEFT  TOE;  Surgeon: Magda Debby SAILOR, MD;  Location: [facility] OR;  Service: Vascular;  Laterality: Left;   APPENDECTOMY     APPLICATION OF WOUND VAC Right 07/21/2019   Procedure: Application Of Prevena Wound Vac Right Groin;  Surgeon: Serene Gaile ORN, MD;  Location: [facility] OR;  Service: Vascular;  Laterality: Right;   BILIARY BRUSHING  04/07/2023   Procedure: BILIARY BRUSHING;  Surgeon: Rosalie Kitchens, MD;  Location: [facility] ENDOSCOPY;  Service: Gastroenterology;;   BILIARY STENT PLACEMENT  04/07/2023    Procedure: BILIARY STENT PLACEMENT;  Surgeon: Rosalie Kitchens, MD;  Location: [facility] ENDOSCOPY;  Service: Gastroenterology;;   CARPAL TUNNEL RELEASE Left    CARPAL TUNNEL RELEASE Right    CESAREAN SECTION     x 2   CHOLECYSTECTOMY N/A 10/12/2022   Procedure: LAPAROSCOPIC PARTIAL FENESTRATING CHOLECYSTECTOMY;  Surgeon: Dasie Leonor CROME, MD;  [facility];  Service: General;  Laterality: N/A;   COLONOSCOPY     CORONARY ARTERY BYPASS GRAFT N/A 03/24/2020   Procedure: CORONARY ARTERY BYPASS GRAFTING (CABG) using LIMA to LAD (m); RIMA to RAMUS; Endoscopic Right Greater Saphenous Vein: SVG to Diag1; SVG to PLB (right); and SVG to PL (left).;  Surgeon: German Bartlett PEDLAR, MD;  [facility] OR;  Service: Open Heart Surgery;  Laterality: N/A;  BILATERAL IMA   ENDARTERECTOMY FEMORAL Right 07/21/2019   Procedure: RIGHT ENDARTERECTOMY FEMORAL WITH PATCH ANGIOPLASTY;  Surgeon: Serene Gaile ORN, MD;  [facility];  Service: Vascular;  Laterality: Right;   ENDARTERECTOMY FEMORAL Right 08/16/2020   Procedure: RIGHT FEMORAL ENDARTERECTOMY;  Surgeon: Sheree Penne Bruckner, MD;  Location: [facility] OR;  Service: Vascular;  Laterality: Right;   ENDOVEIN HARVEST OF GREATER SAPHENOUS VEIN Right 03/24/2020   Procedure: ENDOVEIN HARVEST OF GREATER SAPHENOUS VEIN;  Surgeon: German Bartlett PEDLAR, MD;  [facility];  Service: Open Heart Surgery;  Laterality: Right;   ERCP N/A 04/07/2023   Procedure: ENDOSCOPIC RETROGRADE CHOLANGIOPANCREATOGRAPHY (ERCP);  Surgeon: Rosalie Kitchens, MD;  Location: [facility] ENDOSCOPY;  Service: Gastroenterology;  Laterality: N/A;   EYE SURGERY     cataract removal bilaterally   FEMORAL-POPLITEAL BYPASS GRAFT Right 08/16/2020   Procedure: BYPASS GRAFT FEMORAL-POPLITEAL ARTERY;  Surgeon: Sheree Penne Bruckner, MD;  Location: [facility] OR;  Service: Vascular;  Laterality: Right;   FEMORAL-POPLITEAL BYPASS GRAFT Left 05/03/2021   Procedure: LEFT FEMORAL TO BELOW KNEE POPLITEAL ARTERY BYPASS GRAFTING WITH 6MMX80 PTFE  GRAFT;  Surgeon: Magda Debby SAILOR, MD;  [facility];  Service: Vascular;  Laterality: Left;   LEFT HEART CATH AND CORONARY ANGIOGRAPHY N/A 03/22/2020   Procedure: LEFT HEART CATH AND CORONARY ANGIOGRAPHY;  Surgeon: [place]az[place], Peter M, MD;  Location: [facility] INVASIVE CV LAB;  Service: Cardiovascular;  Laterality: N/A;   LOWER EXTREMITY ANGIOGRAPHY Bilateral 06/15/2019   Procedure: Lower Extremity Angiography;  Surgeon: Court Dorn PARAS, MD;  Location: [facility] INVASIVE CV LAB;  Service: Cardiovascular;  Laterality: Bilateral;   LOWER EXTREMITY ANGIOGRAPHY Right 08/03/2019   Procedure: LOWER EXTREMITY ANGIOGRAPHY;  Surgeon: Court Dorn PARAS, MD;  [facility];  Service: Cardiovascular;  Laterality: Right;   LOWER EXTREMITY ANGIOGRAPHY  N/A 09/07/2021   Procedure: LOWER EXTREMITY ANGIOGRAPHY;  Surgeon: Gretta Lonni PARAS, MD;  Location: [facility] INVASIVE CV LAB;  Service: Cardiovascular;  Laterality: N/A;   PATCH ANGIOPLASTY Right 07/21/2019   Procedure: Patch Angioplasty Right Femoral Artery;  Surgeon: Serene Gaile ORN, MD;  Location: [facility] OR;  Service: Vascular;  Laterality: Right;   PERIPHERAL INTRAVASCULAR LITHOTRIPSY  11/25/2019   Procedure: INTRAVASCULAR LITHOTRIPSY;  Surgeon: Darron Deatrice LABOR, MD;  [facility];  Service: Cardiovascular;;  LT. SFA   PERIPHERAL VASCULAR ATHERECTOMY  08/03/2019   Procedure: PERIPHERAL VASCULAR ATHERECTOMY;  Surgeon: Court Dorn PARAS, MD;  Location: [facility] INVASIVE CV LAB;  Service: Cardiovascular;;  right SFA, right TP trunk   PERIPHERAL VASCULAR ATHERECTOMY  11/25/2019   Procedure: PERIPHERAL VASCULAR ATHERECTOMY;  Surgeon: Darron Deatrice LABOR, MD;  [facility];  Service: Cardiovascular;;  Lt.  POPLITEAL and AT   PERIPHERAL VASCULAR BALLOON ANGIOPLASTY Left 06/15/2019   Procedure: PERIPHERAL VASCULAR BALLOON ANGIOPLASTY;  Surgeon: Court Dorn PARAS, MD;  [facility];  Service: Cardiovascular;  Laterality: Left;  SFA  UNSUCCESSFUL UNABLE TO CROSS LESION   PERIPHERAL VASCULAR BALLOON ANGIOPLASTY  08/03/2019   Procedure: PERIPHERAL VASCULAR BALLOON ANGIOPLASTY;  Surgeon: Court Dorn PARAS, MD;  [facility];  Service: Cardiovascular;;  right SFA, Right TP trunk   PERIPHERAL VASCULAR BALLOON ANGIOPLASTY  09/07/2021   Procedure: PERIPHERAL VASCULAR BALLOON ANGIOPLASTY;  Surgeon: Gretta Lonni PARAS, MD;  [facility];  Service: Cardiovascular;;   PERIPHERAL VASCULAR BALLOON ANGIOPLASTY Right 10/13/2021   Procedure: PERIPHERAL VASCULAR BALLOON ANGIOPLASTY;  Surgeon: Magda Debby SAILOR, MD;  [facility];  Service: Cardiovascular;  Laterality: Right;   PERIPHERAL VASCULAR BALLOON ANGIOPLASTY  11/09/2022   Procedure: PERIPHERAL VASCULAR BALLOON ANGIOPLASTY;  Surgeon: Magda Debby SAILOR, MD;  [facility];  Service: Cardiovascular;;  fem-pop bypass and AT   PORTACATH PLACEMENT Left 04/29/2023   Procedure: INSERTION PORT-A-CATH;  Surgeon: Dasie Leonor CROME, MD;  Location: [facility] ORS;  Service: General;  Laterality: Left;   PORTACATH PLACEMENT N/A 07/12/2023   Procedure: REVISION PORT-A-CATH;  Surgeon: Dasie Leonor CROME, MD;  Location: [facility] OR;  Service: General;  Laterality: N/A;   REMOVAL OF STONES  04/07/2023   Procedure: REMOVAL OF STONES;  Surgeon: Rosalie Kitchens, MD;  Location: [facility] ENDOSCOPY;  Service: Gastroenterology;;   ANNETT  04/07/2023   Procedure: ANNETT;  Surgeon: Rosalie Kitchens, MD;  Location: [facility] ENDOSCOPY;  Service: Gastroenterology;;   TEE WITHOUT CARDIOVERSION N/A 03/24/2020   Procedure: TRANSESOPHAGEAL ECHOCARDIOGRAM (TEE);  Surgeon: German Bartlett PEDLAR, MD;  Location: [facility] OR;  Service: Open Heart Surgery;  Laterality: N/A;   TONSILLECTOMY     and adenoidectomy   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/2019   Procedure: TRANSMETATARSAL AMPUTATION;  Surgeon: Gershon Donnice SAUNDERS, DPM;  Location: [facility] OR;  Service: Podiatry;  Laterality: Right;    TRANSMETATARSAL AMPUTATION Left 05/11/2021   Procedure: LEFT TRANSMETATARSAL AMPUTATION;  Surgeon: Sheree Penne Lonni, MD;  Location: [facility] OR;  Service: Vascular;  Laterality: Left;   TUBAL LIGATION      I have reviewed the social history and family history with the patient and they are unchanged from previous note.  ALLERGIES:  is allergic to versed  [midazolam ], bactrim  [sulfamethoxazole -trimethoprim ], augmentin [amoxicillin-pot clavulanate], demerol [meperidine], meperidine hcl, scopolamine, and tramadol .  MEDICATIONS:  Current Outpatient Medications  Medication Sig Dispense Refill   acetaminophen  (TYLENOL ) 500 MG tablet Take 1,000 mg by mouth every 6 (six) hours  as needed for moderate pain (pain score 4-6), fever or mild pain (pain score 1-3).     brimonidine  (ALPHAGAN ) 0.2 % ophthalmic solution Place 1 drop into the right eye 2 (two) times daily.     ferrous sulfate  325 (65 FE) MG EC tablet Take 1 tablet (325 mg total) by mouth daily before breakfast. To be taken with a cup of orange juice on an otherwise empty stomach if possible     furosemide  (LASIX ) 20 MG tablet Take 1 tablet (20 mg total) by mouth daily. 30 tablet 0   hydrochlorothiazide  (HYDRODIURIL ) 12.5 MG tablet Take 12.5 mg by mouth daily.     JARDIANCE  25 MG TABS tablet Take 25 mg by mouth daily.     magnesium  oxide (MAG-OX) 400 (240 Mg) MG tablet Take 400 mg by mouth 2 (two) times daily.     Metformin  HCl 500 MG/5ML SOLN Take 10 mLs by mouth in the morning and at bedtime.     metoprolol  tartrate (LOPRESSOR ) 25 MG tablet Take 1 tablet (25 mg total) by mouth 2 (two) times daily.     mirtazapine  (REMERON ) 15 MG tablet Take 15 mg by mouth at bedtime.     pantoprazole  (PROTONIX ) 40 MG tablet Take 1 tablet (40 mg total) by mouth 2 (two) times daily.     polyethylene glycol (MIRALAX  / GLYCOLAX ) 17 g packet Take 17 g by mouth daily.     Probiotic Product (PROBIOTIC PO) Take 1 capsule by mouth daily.     Propylene Glycol (SYSTANE  BALANCE) 0.6 % SOLN Place 1 drop into the left eye 3 (three) times daily as needed (dry/irritated eyes).     rosuvastatin  (CRESTOR ) 40 MG tablet Take 40 mg by mouth at bedtime.     telmisartan  (MICARDIS ) 40 MG tablet Take 40 mg by mouth daily.     traZODone  (DESYREL ) 100 MG tablet Take 100 mg by mouth at bedtime.     XARELTO  10 MG TABS tablet Take 1 tablet (10 mg total) by mouth daily. 30 tablet 2   No current facility-administered medications for this visit.   Facility-Administered Medications Ordered in Other Visits  Medication Dose Route Frequency Provider Last Rate Last Admin   0.9 %  sodium chloride  infusion   Intravenous Continuous Lanny Callander, MD 10 mL/hr at 06/01/24 1502 New Bag at 06/01/24 1502   durvalumab  (IMFINZI ) 1,500 mg in sodium chloride  0.9 % 100 mL chemo infusion  1,500 mg Intravenous Once Lanny Callander, MD        PHYSICAL EXAMINATION: ECOG PERFORMANCE STATUS: 3 - Symptomatic, >50% confined to bed  Vitals:   06/01/24 1346 06/01/24 1355  BP: (!) 95/37 (!) 90/38  Pulse: 91   Resp: 17   Temp: 97.9 F (36.6 C)   SpO2: 100%    Wt Readings from Last 3 Encounters:  06/01/24 133 lb 1.6 oz (60.4 kg)  05/23/24 137 lb 12.6 oz (62.5 kg)  05/20/24 136 lb (61.7 kg)     GENERAL:alert, no distress and comfortable, chronically ill appearing SKIN: skin color, texture, turgor are normal, no rashes or significant lesions except ecchymosis in both arms EYES: normal, Conjunctiva are pink and non-injected, sclera clear NECK: supple, thyroid  normal size, non-tender, without nodularity LYMPH:  no palpable lymphadenopathy in the cervical, axillary  LUNGS: clear to auscultation and percussion with normal breathing effort HEART: regular rate & rhythm and no murmurs and no lower extremity edema ABDOMEN:abdomen soft, non-tender and normal bowel sounds Musculoskeletal:no cyanosis of digits and no  clubbing, (+) diffuse edema in all extremity, especially in left hand and forearm NEURO: alert &  oriented x 3 with fluent speech, no focal motor/sensory deficits  Physical Exam    LABORATORY DATA:  I have reviewed the data as listed    Latest Ref Rng & Units 06/01/2024    1:33 PM 05/23/2024    9:27 AM 05/22/2024    8:47 PM  CBC  WBC 4.0 - 10.5 K/uL 12.9  11.6  12.3   Hemoglobin 12.0 - 15.0 g/dL 6.7  9.1  6.6   Hematocrit 36.0 - 46.0 % 20.4  27.5  20.5   Platelets 150 - 400 K/uL 237  196  228         Latest Ref Rng & Units 06/01/2024    1:33 PM 05/22/2024    8:47 PM 05/18/2024    8:41 AM  CMP  Glucose 70 - 99 mg/dL 777  862  827   BUN 8 - 23 mg/dL 41  35  35   Creatinine 0.44 - 1.00 mg/dL 8.34  8.00  8.05   Sodium 135 - 145 mmol/L 139  141  139   Potassium 3.5 - 5.1 mmol/L 3.7  4.1  4.1   Chloride 98 - 111 mmol/L 106  106  106   CO2 22 - 32 mmol/L 23  22  26    Calcium  8.9 - 10.3 mg/dL 7.7  9.1  7.9   Total Protein 6.5 - 8.1 g/dL 3.8   4.2   Total Bilirubin 0.0 - 1.2 mg/dL 0.4   0.4   Alkaline Phos 38 - 126 U/L 162   347   AST 15 - 41 U/L 18   53   ALT 0 - 44 U/L 14   44       RADIOGRAPHIC STUDIES: I have personally reviewed the radiological images as listed and agreed with the findings in the report. No results found.    No orders of the defined types were placed in this encounter.  All questions were answered. The patient knows to call the clinic with any problems, questions or concerns. No barriers to learning was detected. The total time spent in the appointment was 40 minutes, including review of chart and various tests results, discussions about plan of care and coordination of care plan     Onita Mattock, MD 06/01/2024

## 2024-06-02 ENCOUNTER — Other Ambulatory Visit: Payer: Self-pay

## 2024-06-02 ENCOUNTER — Encounter (HOSPITAL_COMMUNITY): Payer: Self-pay

## 2024-06-02 ENCOUNTER — Inpatient Hospital Stay (HOSPITAL_COMMUNITY)
Admission: EM | Admit: 2024-06-02 | Discharge: 2024-06-09 | DRG: 377 | Disposition: A | Discharge status: Home Health | Source: Ambulatory Visit | Attending: Family Medicine | Admitting: Family Medicine

## 2024-06-02 ENCOUNTER — Ambulatory Visit

## 2024-06-02 ENCOUNTER — Emergency Department (HOSPITAL_COMMUNITY)

## 2024-06-02 DIAGNOSIS — C787 Secondary malignant neoplasm of liver and intrahepatic bile duct: Secondary | ICD-10-CM | POA: Diagnosis not present

## 2024-06-02 DIAGNOSIS — Z5112 Encounter for antineoplastic immunotherapy: Secondary | ICD-10-CM

## 2024-06-02 DIAGNOSIS — M1990 Unspecified osteoarthritis, unspecified site: Secondary | ICD-10-CM | POA: Diagnosis present

## 2024-06-02 DIAGNOSIS — Z87891 Personal history of nicotine dependence: Secondary | ICD-10-CM

## 2024-06-02 DIAGNOSIS — E1151 Type 2 diabetes mellitus with diabetic peripheral angiopathy without gangrene: Secondary | ICD-10-CM | POA: Diagnosis present

## 2024-06-02 DIAGNOSIS — R188 Other ascites: Secondary | ICD-10-CM | POA: Diagnosis not present

## 2024-06-02 DIAGNOSIS — Z7984 Long term (current) use of oral hypoglycemic drugs: Secondary | ICD-10-CM | POA: Diagnosis not present

## 2024-06-02 DIAGNOSIS — L89154 Pressure ulcer of sacral region, stage 4: Secondary | ICD-10-CM | POA: Diagnosis present

## 2024-06-02 DIAGNOSIS — T451X5A Adverse effect of antineoplastic and immunosuppressive drugs, initial encounter: Secondary | ICD-10-CM | POA: Diagnosis present

## 2024-06-02 DIAGNOSIS — Z79899 Other long term (current) drug therapy: Secondary | ICD-10-CM

## 2024-06-02 DIAGNOSIS — E1122 Type 2 diabetes mellitus with diabetic chronic kidney disease: Secondary | ICD-10-CM | POA: Diagnosis present

## 2024-06-02 DIAGNOSIS — I959 Hypotension, unspecified: Secondary | ICD-10-CM | POA: Diagnosis present

## 2024-06-02 DIAGNOSIS — N1831 Chronic kidney disease, stage 3a: Secondary | ICD-10-CM | POA: Diagnosis present

## 2024-06-02 DIAGNOSIS — Z95820 Peripheral vascular angioplasty status with implants and grafts: Secondary | ICD-10-CM | POA: Diagnosis not present

## 2024-06-02 DIAGNOSIS — K259 Gastric ulcer, unspecified as acute or chronic, without hemorrhage or perforation: Secondary | ICD-10-CM | POA: Diagnosis not present

## 2024-06-02 DIAGNOSIS — I129 Hypertensive chronic kidney disease with stage 1 through stage 4 chronic kidney disease, or unspecified chronic kidney disease: Secondary | ICD-10-CM | POA: Diagnosis present

## 2024-06-02 DIAGNOSIS — E785 Hyperlipidemia, unspecified: Secondary | ICD-10-CM | POA: Diagnosis present

## 2024-06-02 DIAGNOSIS — R918 Other nonspecific abnormal finding of lung field: Secondary | ICD-10-CM | POA: Diagnosis not present

## 2024-06-02 DIAGNOSIS — K254 Chronic or unspecified gastric ulcer with hemorrhage: Principal | ICD-10-CM | POA: Diagnosis present

## 2024-06-02 DIAGNOSIS — K922 Gastrointestinal hemorrhage, unspecified: Secondary | ICD-10-CM | POA: Diagnosis not present

## 2024-06-02 DIAGNOSIS — Z951 Presence of aortocoronary bypass graft: Secondary | ICD-10-CM

## 2024-06-02 DIAGNOSIS — I252 Old myocardial infarction: Secondary | ICD-10-CM

## 2024-06-02 DIAGNOSIS — D62 Acute posthemorrhagic anemia: Secondary | ICD-10-CM | POA: Diagnosis present

## 2024-06-02 DIAGNOSIS — N179 Acute kidney failure, unspecified: Secondary | ICD-10-CM | POA: Diagnosis present

## 2024-06-02 DIAGNOSIS — D649 Anemia, unspecified: Secondary | ICD-10-CM | POA: Diagnosis not present

## 2024-06-02 DIAGNOSIS — I480 Paroxysmal atrial fibrillation: Secondary | ICD-10-CM | POA: Diagnosis present

## 2024-06-02 DIAGNOSIS — K2951 Unspecified chronic gastritis with bleeding: Secondary | ICD-10-CM | POA: Diagnosis present

## 2024-06-02 DIAGNOSIS — Z881 Allergy status to other antibiotic agents status: Secondary | ICD-10-CM

## 2024-06-02 DIAGNOSIS — I2510 Atherosclerotic heart disease of native coronary artery without angina pectoris: Secondary | ICD-10-CM | POA: Diagnosis not present

## 2024-06-02 DIAGNOSIS — K3189 Other diseases of stomach and duodenum: Secondary | ICD-10-CM | POA: Diagnosis present

## 2024-06-02 DIAGNOSIS — Z8249 Family history of ischemic heart disease and other diseases of the circulatory system: Secondary | ICD-10-CM

## 2024-06-02 DIAGNOSIS — Z923 Personal history of irradiation: Secondary | ICD-10-CM

## 2024-06-02 DIAGNOSIS — Z7901 Long term (current) use of anticoagulants: Secondary | ICD-10-CM

## 2024-06-02 DIAGNOSIS — Z89422 Acquired absence of other left toe(s): Secondary | ICD-10-CM

## 2024-06-02 DIAGNOSIS — I739 Peripheral vascular disease, unspecified: Secondary | ICD-10-CM | POA: Diagnosis present

## 2024-06-02 DIAGNOSIS — E440 Moderate protein-calorie malnutrition: Secondary | ICD-10-CM | POA: Diagnosis not present

## 2024-06-02 DIAGNOSIS — C23 Malignant neoplasm of gallbladder: Secondary | ICD-10-CM | POA: Diagnosis not present

## 2024-06-02 DIAGNOSIS — K219 Gastro-esophageal reflux disease without esophagitis: Secondary | ICD-10-CM | POA: Diagnosis present

## 2024-06-02 DIAGNOSIS — N2889 Other specified disorders of kidney and ureter: Secondary | ICD-10-CM | POA: Diagnosis not present

## 2024-06-02 DIAGNOSIS — L89516 Pressure-induced deep tissue damage of right ankle: Secondary | ICD-10-CM | POA: Diagnosis not present

## 2024-06-02 DIAGNOSIS — Z808 Family history of malignant neoplasm of other organs or systems: Secondary | ICD-10-CM

## 2024-06-02 DIAGNOSIS — T50995A Adverse effect of other drugs, medicaments and biological substances, initial encounter: Secondary | ICD-10-CM | POA: Diagnosis present

## 2024-06-02 DIAGNOSIS — K920 Hematemesis: Secondary | ICD-10-CM | POA: Insufficient documentation

## 2024-06-02 DIAGNOSIS — Y929 Unspecified place or not applicable: Secondary | ICD-10-CM | POA: Diagnosis not present

## 2024-06-02 DIAGNOSIS — E1142 Type 2 diabetes mellitus with diabetic polyneuropathy: Secondary | ICD-10-CM | POA: Diagnosis present

## 2024-06-02 DIAGNOSIS — D630 Anemia in neoplastic disease: Secondary | ICD-10-CM | POA: Diagnosis present

## 2024-06-02 DIAGNOSIS — K449 Diaphragmatic hernia without obstruction or gangrene: Secondary | ICD-10-CM | POA: Diagnosis present

## 2024-06-02 DIAGNOSIS — R000 Tachycardia, unspecified: Secondary | ICD-10-CM | POA: Diagnosis not present

## 2024-06-02 DIAGNOSIS — J168 Pneumonia due to other specified infectious organisms: Secondary | ICD-10-CM | POA: Diagnosis not present

## 2024-06-02 DIAGNOSIS — K649 Unspecified hemorrhoids: Secondary | ICD-10-CM | POA: Diagnosis present

## 2024-06-02 DIAGNOSIS — D6859 Other primary thrombophilia: Secondary | ICD-10-CM | POA: Diagnosis present

## 2024-06-02 DIAGNOSIS — Z751 Person awaiting admission to adequate facility elsewhere: Secondary | ICD-10-CM

## 2024-06-02 DIAGNOSIS — Z823 Family history of stroke: Secondary | ICD-10-CM

## 2024-06-02 DIAGNOSIS — Z794 Long term (current) use of insulin: Secondary | ICD-10-CM | POA: Diagnosis not present

## 2024-06-02 DIAGNOSIS — N182 Chronic kidney disease, stage 2 (mild): Secondary | ICD-10-CM | POA: Diagnosis present

## 2024-06-02 DIAGNOSIS — D6869 Other thrombophilia: Secondary | ICD-10-CM | POA: Diagnosis not present

## 2024-06-02 DIAGNOSIS — Z9221 Personal history of antineoplastic chemotherapy: Secondary | ICD-10-CM

## 2024-06-02 DIAGNOSIS — Z885 Allergy status to narcotic agent status: Secondary | ICD-10-CM

## 2024-06-02 DIAGNOSIS — Z6823 Body mass index (BMI) 23.0-23.9, adult: Secondary | ICD-10-CM

## 2024-06-02 DIAGNOSIS — L8994 Pressure ulcer of unspecified site, stage 4: Secondary | ICD-10-CM | POA: Diagnosis not present

## 2024-06-02 DIAGNOSIS — I700 Atherosclerosis of aorta: Secondary | ICD-10-CM | POA: Diagnosis not present

## 2024-06-02 DIAGNOSIS — F419 Anxiety disorder, unspecified: Secondary | ICD-10-CM | POA: Diagnosis present

## 2024-06-02 DIAGNOSIS — Z9049 Acquired absence of other specified parts of digestive tract: Secondary | ICD-10-CM

## 2024-06-02 DIAGNOSIS — Z0389 Encounter for observation for other suspected diseases and conditions ruled out: Secondary | ICD-10-CM | POA: Diagnosis not present

## 2024-06-02 DIAGNOSIS — Z83438 Family history of other disorder of lipoprotein metabolism and other lipidemia: Secondary | ICD-10-CM

## 2024-06-02 DIAGNOSIS — Z888 Allergy status to other drugs, medicaments and biological substances status: Secondary | ICD-10-CM

## 2024-06-02 DIAGNOSIS — C221 Intrahepatic bile duct carcinoma: Secondary | ICD-10-CM | POA: Diagnosis present

## 2024-06-02 DIAGNOSIS — K2970 Gastritis, unspecified, without bleeding: Secondary | ICD-10-CM | POA: Diagnosis not present

## 2024-06-02 DIAGNOSIS — J189 Pneumonia, unspecified organism: Secondary | ICD-10-CM

## 2024-06-02 LAB — COMPREHENSIVE METABOLIC PANEL WITH GFR
ALT: 19 U/L (ref 0–44)
AST: 36 U/L (ref 15–41)
Albumin: 1.8 g/dL — ABNORMAL LOW (ref 3.5–5.0)
Alkaline Phosphatase: 175 U/L — ABNORMAL HIGH (ref 38–126)
Anion gap: 25 — ABNORMAL HIGH (ref 5–15)
BUN: 41 mg/dL — ABNORMAL HIGH (ref 8–23)
CO2: 11 mmol/L — ABNORMAL LOW (ref 22–32)
Calcium: 8.9 mg/dL (ref 8.9–10.3)
Chloride: 103 mmol/L (ref 98–111)
Creatinine, Ser: 1.76 mg/dL — ABNORMAL HIGH (ref 0.44–1.00)
GFR, Estimated: 28 mL/min — ABNORMAL LOW (ref >60)
Glucose, Bld: 183 mg/dL — ABNORMAL HIGH (ref 70–99)
Potassium: 4.1 mmol/L (ref 3.5–5.1)
Sodium: 139 mmol/L (ref 135–145)
Total Bilirubin: 0.3 mg/dL (ref 0.0–1.2)
Total Protein: 3.9 g/dL — ABNORMAL LOW (ref 6.5–8.1)

## 2024-06-02 LAB — PREPARE RBC (CROSSMATCH)

## 2024-06-02 LAB — CBC WITH DIFFERENTIAL/PLATELET
Abs Immature Granulocytes: 0.43 K/uL — ABNORMAL HIGH (ref 0.00–0.07)
Basophils Absolute: 0 K/uL (ref 0.0–0.1)
Basophils Relative: 0 %
Eosinophils Absolute: 0 K/uL (ref 0.0–0.5)
Eosinophils Relative: 0 %
HCT: 19.1 % — ABNORMAL LOW (ref 36.0–46.0)
Hemoglobin: 5.7 g/dL — CL (ref 12.0–15.0)
Immature Granulocytes: 2 %
Lymphocytes Relative: 3 %
Lymphs Abs: 0.7 K/uL (ref 0.7–4.0)
MCH: 31 pg (ref 26.0–34.0)
MCHC: 29.8 g/dL — ABNORMAL LOW (ref 30.0–36.0)
MCV: 103.8 fL — ABNORMAL HIGH (ref 80.0–100.0)
Monocytes Absolute: 1.6 K/uL — ABNORMAL HIGH (ref 0.1–1.0)
Monocytes Relative: 7 %
Neutro Abs: 20.7 K/uL — ABNORMAL HIGH (ref 1.7–7.7)
Neutrophils Relative %: 88 %
Platelets: 260 K/uL (ref 150–400)
RBC: 1.84 MIL/uL — ABNORMAL LOW (ref 3.87–5.11)
RDW: 21.2 % — ABNORMAL HIGH (ref 11.5–15.5)
Smear Review: NORMAL
WBC: 23.4 K/uL — ABNORMAL HIGH (ref 4.0–10.5)
nRBC: 0.1 % (ref 0.0–0.2)

## 2024-06-02 LAB — PROTIME-INR
INR: 6.9 (ref 0.8–1.2)
Prothrombin Time: 62.1 s — ABNORMAL HIGH (ref 11.4–15.2)

## 2024-06-02 LAB — CBG MONITORING, ED: Glucose-Capillary: 143 mg/dL — ABNORMAL HIGH (ref 70–99)

## 2024-06-02 LAB — MRSA NEXT GEN BY PCR, NASAL: MRSA by PCR Next Gen: DETECTED — AB

## 2024-06-02 LAB — LIPASE, BLOOD: Lipase: <10 U/L — ABNORMAL LOW (ref 11–51)

## 2024-06-02 LAB — GLUCOSE, CAPILLARY
Glucose-Capillary: 125 mg/dL — ABNORMAL HIGH (ref 70–99)
Glucose-Capillary: 139 mg/dL — ABNORMAL HIGH (ref 70–99)
Glucose-Capillary: 161 mg/dL — ABNORMAL HIGH (ref 70–99)

## 2024-06-02 LAB — APTT: aPTT: 51 s — ABNORMAL HIGH (ref 24–36)

## 2024-06-02 LAB — HEMOGLOBIN AND HEMATOCRIT, BLOOD
HCT: 30.8 % — ABNORMAL LOW (ref 36.0–46.0)
Hemoglobin: 10 g/dL — ABNORMAL LOW (ref 12.0–15.0)

## 2024-06-02 LAB — LACTIC ACID, PLASMA
Lactic Acid, Venous: 4.7 mmol/L (ref 0.5–1.9)
Lactic Acid, Venous: 4.7 mmol/L (ref 0.5–1.9)

## 2024-06-02 MED ORDER — SODIUM CHLORIDE 0.9 % IV SOLN: 2.0000 g | Freq: Three times a day (TID) | INTRAVENOUS | Status: DC

## 2024-06-02 MED ORDER — ALBUTEROL SULFATE (2.5 MG/3ML) 0.083% IN NEBU: 2.5000 mg | INHALATION_SOLUTION | RESPIRATORY_TRACT | Status: DC | PRN

## 2024-06-02 MED ORDER — SODIUM CHLORIDE 0.9% IV SOLUTION
Freq: Once | INTRAVENOUS | Status: DC
Start: 2024-06-02 — End: 2024-06-09

## 2024-06-02 MED ORDER — VITAMIN K1 10 MG/ML IJ SOLN
10.0000 mg | Freq: Once | INTRAVENOUS | Status: AC
  Administered 2024-06-02: 10 mg via INTRAVENOUS
  Filled 2024-06-02: qty 1

## 2024-06-02 MED ORDER — ORAL CARE MOUTH RINSE: 15.0000 mL | OROMUCOSAL | Status: DC | PRN

## 2024-06-02 MED ORDER — PANTOPRAZOLE SODIUM 40 MG IV SOLR: 40.0000 mg | Freq: Every day | INTRAVENOUS | Status: DC

## 2024-06-02 MED ORDER — PROTHROMBIN COMPLEX CONC HUMAN 500 UNITS IV KIT
3228.0000 [IU] | PACK | Status: AC
  Administered 2024-06-02: 3228 [IU] via INTRAVENOUS
  Filled 2024-06-02: qty 3228

## 2024-06-02 MED ORDER — ONDANSETRON HCL 4 MG PO TABS: 4.0000 mg | ORAL_TABLET | Freq: Four times a day (QID) | ORAL | Status: DC | PRN

## 2024-06-02 MED ORDER — ACETAMINOPHEN 650 MG RE SUPP: 650.0000 mg | Freq: Four times a day (QID) | RECTAL | Status: DC | PRN

## 2024-06-02 MED ORDER — SODIUM CHLORIDE 0.9% IV SOLUTION
250.0000 mL | INTRAVENOUS | Status: DC
  Administered 2024-06-02: 250 mL via INTRAVENOUS

## 2024-06-02 MED ORDER — LACTATED RINGERS IV BOLUS
500.0000 mL | Freq: Once | INTRAVENOUS | Status: AC
Start: 2024-06-03 — End: 2024-06-03
  Administered 2024-06-02: 500 mL via INTRAVENOUS

## 2024-06-02 MED ORDER — PIPERACILLIN-TAZOBACTAM 3.375 G IVPB
3.3750 g | Freq: Three times a day (TID) | INTRAVENOUS | Status: DC
  Administered 2024-06-02 – 2024-06-03 (×3): 3.375 g via INTRAVENOUS
  Filled 2024-06-02 (×3): qty 50

## 2024-06-02 MED ORDER — SODIUM CHLORIDE 0.9 % IV SOLN: INTRAVENOUS | Status: AC

## 2024-06-02 MED ORDER — PANTOPRAZOLE SODIUM 40 MG IV SOLR
40.0000 mg | Freq: Two times a day (BID) | INTRAVENOUS | Status: DC
  Administered 2024-06-02 – 2024-06-03 (×2): 40 mg via INTRAVENOUS
  Filled 2024-06-02 (×2): qty 10

## 2024-06-02 MED ORDER — SODIUM CHLORIDE 0.9% IV SOLUTION: Freq: Once | INTRAVENOUS | Status: DC

## 2024-06-02 MED ORDER — ONDANSETRON HCL 4 MG/2ML IJ SOLN: 4.0000 mg | Freq: Four times a day (QID) | INTRAMUSCULAR | Status: DC | PRN

## 2024-06-02 MED ORDER — ZINC OXIDE 40 % EX OINT
TOPICAL_OINTMENT | Freq: Two times a day (BID) | CUTANEOUS | Status: DC
  Administered 2024-06-03 – 2024-06-05 (×2): 1 via TOPICAL
  Filled 2024-06-02 (×2): qty 57

## 2024-06-02 MED ORDER — SODIUM CHLORIDE 0.9% IV SOLUTION: Freq: Once | INTRAVENOUS | Status: AC

## 2024-06-02 MED ORDER — SODIUM CHLORIDE 0.9 % IV SOLN
10.0000 mL/h | Freq: Once | INTRAVENOUS | Status: AC
  Administered 2024-06-02: 10 mL/h via INTRAVENOUS

## 2024-06-02 MED ORDER — ACETAMINOPHEN 325 MG PO TABS
650.0000 mg | ORAL_TABLET | Freq: Once | ORAL | Status: AC
  Administered 2024-06-02: 650 mg via ORAL
  Filled 2024-06-02: qty 2

## 2024-06-02 MED ORDER — DIPHENHYDRAMINE HCL 25 MG PO CAPS
25.0000 mg | ORAL_CAPSULE | Freq: Once | ORAL | Status: AC
  Administered 2024-06-02: 25 mg via ORAL
  Filled 2024-06-02: qty 1

## 2024-06-02 MED ORDER — LACTATED RINGERS IV BOLUS
500.0000 mL | Freq: Once | INTRAVENOUS | Status: AC
  Administered 2024-06-02: 500 mL via INTRAVENOUS

## 2024-06-02 MED ORDER — SODIUM CHLORIDE 0.9 % IV SOLN: 2.0000 g | Freq: Every day | INTRAVENOUS | Status: DC

## 2024-06-02 MED ORDER — CHLORHEXIDINE GLUCONATE CLOTH 2 % EX PADS
6.0000 | MEDICATED_PAD | Freq: Every day | CUTANEOUS | Status: DC
  Administered 2024-06-02 – 2024-06-09 (×8): 6 via TOPICAL

## 2024-06-02 MED ORDER — VANCOMYCIN HCL IN DEXTROSE 1-5 GM/200ML-% IV SOLN: 1000.0000 mg | Freq: Once | INTRAVENOUS | Status: DC

## 2024-06-02 MED ORDER — TRAZODONE HCL 50 MG PO TABS
25.0000 mg | ORAL_TABLET | Freq: Every evening | ORAL | Status: DC | PRN
  Administered 2024-06-04 – 2024-06-08 (×5): 25 mg via ORAL
  Filled 2024-06-02 (×5): qty 1

## 2024-06-02 MED ORDER — METRONIDAZOLE 500 MG/100ML IV SOLN
500.0000 mg | Freq: Once | INTRAVENOUS | Status: AC
  Administered 2024-06-02: 500 mg via INTRAVENOUS
  Filled 2024-06-02: qty 100

## 2024-06-02 MED ORDER — PANTOPRAZOLE SODIUM 40 MG IV SOLR
40.0000 mg | Freq: Once | INTRAVENOUS | Status: AC
  Administered 2024-06-02: 40 mg via INTRAVENOUS
  Filled 2024-06-02: qty 10

## 2024-06-02 MED ORDER — INSULIN ASPART 100 UNIT/ML IJ SOLN
0.0000 [IU] | INTRAMUSCULAR | Status: DC
  Administered 2024-06-02: 2 [IU] via SUBCUTANEOUS
  Administered 2024-06-02 – 2024-06-04 (×4): 3 [IU] via SUBCUTANEOUS
  Administered 2024-06-04: 2 [IU] via SUBCUTANEOUS
  Administered 2024-06-04: 3 [IU] via SUBCUTANEOUS

## 2024-06-02 MED ORDER — SODIUM CHLORIDE 0.9 % IV SOLN
2.0000 g | Freq: Once | INTRAVENOUS | Status: AC
  Administered 2024-06-02: 2 g via INTRAVENOUS
  Filled 2024-06-02: qty 20

## 2024-06-02 MED ORDER — ACETAMINOPHEN 325 MG PO TABS
650.0000 mg | ORAL_TABLET | Freq: Four times a day (QID) | ORAL | Status: DC | PRN
  Administered 2024-06-04 – 2024-06-08 (×6): 650 mg via ORAL
  Filled 2024-06-02 (×6): qty 2

## 2024-06-02 NOTE — H&P (View-Only) (Signed)
 [facility] Consult  Referring Provider: ER/Dr. Lavonia Jackson Primary Care Physician:  Chrystal Lamarr RAMAN, MD Primary Gastroenterologist: Dr. Rosalie  Reason for Consultation: Anemia, hematemesis  HPI: [name] is a 83 y.o. female with past medical history of gallbladder cancer with liver metastases, ascites, status post chemotherapy and radiation recently started on immunotherapy was advised to get 2 units of PRBC transfusion when labs yesterday showed a hemoglobin of 6.7. She came to ER because of feeling weak, was found to have low blood pressure and also reports 1 episode of hematemesis which patient describes as throwing 2 tablespoon of bright red blood.  She is on Xarelto  and last dose was today morning.  Patient states that his stools have been black, intermittently dark and also notices some blood in the stool from hemorrhoids. She was recently seen as a consult on 05/23/2024 for symptomatic anemia which was thought to be multifactorial: Sacral ulcer with chronic oozing, on Xarelto , gallbladder cancer with metastases, chemotherapy, radiation, hemorrhoidal blood loss.  Previous GI workup: ERCP 04/07/2023, jaundice, Dr. Rosalie: 10 French x 12 cm plastic biliary stent placed in CBD Colonoscopy, 2012, Dr. 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 There is a small calcified mass in the L:A near the LA appendage. No valvular masses seen with associated mitral annular calcification. LA Volume/ BSA27.4 ml/m2 No AS. Right ventricular systolic pressure is elevated at .   Jaundice    Myocardial infarction [facility])    June 2021   Neuromuscular disorder (HCC)    neuropathy in bilateral feet   Peripheral vascular disease (HCC)    Pneumonia    not hosp.    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  PTCAs with drug coated balloon Lt ant tibial artery and Lt popliteal artery  11/26/2019    Past Surgical History:  Procedure Laterality Date   ABDOMINAL AORTAGRAM  11/25/2019   ABDOMINAL AORTOGRAM W/LOWER EXTREMITY    ABDOMINAL AORTOGRAM N/A 06/15/2019   Procedure: ABDOMINAL AORTOGRAM;  Surgeon: Court Dorn PARAS, MD;  [facility];  Service: Cardiovascular;  Laterality: N/A;   ABDOMINAL AORTOGRAM W/LOWER EXTREMITY N/A 11/25/2019   Procedure: ABDOMINAL AORTOGRAM W/LOWER EXTREMITY;  Surgeon: Darron Deatrice LABOR, MD;  [facility]: [facility];  Service: Cardiovascular;  Laterality: N/A;  Lt leg   ABDOMINAL AORTOGRAM W/LOWER EXTREMITY N/A 08/01/2020   Procedure: ABDOMINAL AORTOGRAM W/LOWER EXTREMITY;  Surgeon: Court Dorn PARAS, MD;  [facility];  Service: Cardiovascular;  Laterality: N/A;   ABDOMINAL AORTOGRAM W/LOWER EXTREMITY N/A 04/28/2021  Procedure: ABDOMINAL AORTOGRAM W/LOWER EXTREMITY;  Surgeon: Harvey Carlin BRAVO, MD;  Location: [facility] INVASIVE CV LAB;  Service: Cardiovascular;  Laterality: N/A;   ABDOMINAL AORTOGRAM W/LOWER EXTREMITY N/A 10/13/2021   Procedure: ABDOMINAL AORTOGRAM W/LOWER EXTREMITY;  Surgeon: Magda Debby SAILOR, MD;  [facility];  Service: Cardiovascular;  Laterality: N/A;    ABDOMINAL AORTOGRAM W/LOWER EXTREMITY N/A 11/09/2022   Procedure: ABDOMINAL AORTOGRAM W/LOWER EXTREMITY;  Surgeon: Magda Debby SAILOR, MD;  [facility];  Service: Cardiovascular;  Laterality: N/A;   ABDOMINAL AORTOGRAM W/LOWER EXTREMITY N/A 11/16/2022   Procedure: ABDOMINAL AORTOGRAM W/LOWER EXTREMITY;  Surgeon: Magda Debby SAILOR, MD;  [facility];  Service: Cardiovascular;  Laterality: N/A;   AMPUTATION TOE Left 05/03/2021   Procedure: AMPUTATION OF THIRD LEFT  TOE;  Surgeon: Magda Debby SAILOR, MD;  Location: [facility] OR;  Service: Vascular;  Laterality: Left;   APPENDECTOMY     APPLICATION OF WOUND VAC Right 07/21/2019   Procedure: Application Of Prevena Wound Vac Right Groin;  Surgeon: Serene Gaile ORN, MD;  Location: [facility] OR;  Service: Vascular;  Laterality: Right;   BILIARY BRUSHING  04/07/2023   Procedure: BILIARY BRUSHING;  Surgeon: Rosalie Kitchens, MD;  Location: [facility] ENDOSCOPY;  Service: Gastroenterology;;   BILIARY STENT PLACEMENT  04/07/2023   Procedure: BILIARY STENT PLACEMENT;  Surgeon: Rosalie Kitchens, MD;  Location: [facility] ENDOSCOPY;  Service: Gastroenterology;;   CARPAL TUNNEL RELEASE Left    CARPAL TUNNEL RELEASE Right    CESAREAN SECTION     x 2   CHOLECYSTECTOMY N/A 10/12/2022   Procedure: LAPAROSCOPIC PARTIAL FENESTRATING CHOLECYSTECTOMY;  Surgeon: Dasie Leonor CROME, MD;  [facility];  Service: General;  Laterality: N/A;   COLONOSCOPY     CORONARY ARTERY BYPASS GRAFT N/A 03/24/2020   Procedure: CORONARY ARTERY BYPASS GRAFTING (CABG) using LIMA to LAD (m); RIMA to RAMUS; Endoscopic Right Greater Saphenous Vein: SVG to Diag1; SVG to PLB (right); and SVG to PL (left).;  Surgeon: German Bartlett PEDLAR, MD;  [facility];  Service: Open Heart Surgery;  Laterality: N/A;  BILATERAL IMA   ENDARTERECTOMY FEMORAL Right 07/21/2019   Procedure: RIGHT ENDARTERECTOMY FEMORAL WITH PATCH ANGIOPLASTY;  Surgeon: Serene Gaile ORN, MD;  [facility];  Service: Vascular;  Laterality:  Right;   ENDARTERECTOMY FEMORAL Right 08/16/2020   Procedure: RIGHT FEMORAL ENDARTERECTOMY;  Surgeon: Sheree Penne Bruckner, MD;  Location: [facility] OR;  Service: Vascular;  Laterality: Right;   ENDOVEIN HARVEST OF GREATER SAPHENOUS VEIN Right 03/24/2020   Procedure: ENDOVEIN HARVEST OF GREATER SAPHENOUS VEIN;  Surgeon: German Bartlett PEDLAR, MD;  [facility];  Service: Open Heart Surgery;  Laterality: Right;   ERCP N/A 04/07/2023   Procedure: ENDOSCOPIC RETROGRADE CHOLANGIOPANCREATOGRAPHY (ERCP);  Surgeon: Rosalie Kitchens, MD;  Location: [facility] ENDOSCOPY;  Service: Gastroenterology;  Laterality: N/A;   EYE SURGERY     cataract removal bilaterally   FEMORAL-POPLITEAL BYPASS GRAFT Right 08/16/2020   Procedure: BYPASS GRAFT FEMORAL-POPLITEAL ARTERY;  Surgeon: Sheree Penne Bruckner, MD;  Location: [facility] OR;  Service: Vascular;  Laterality: Right;   FEMORAL-POPLITEAL BYPASS GRAFT Left 05/03/2021   Procedure: LEFT FEMORAL TO BELOW KNEE POPLITEAL ARTERY BYPASS GRAFTING WITH 6MMX80 PTFE GRAFT;  Surgeon: Magda Debby SAILOR, MD;  [facility];  Service: Vascular;  Laterality: Left;   LEFT HEART CATH AND CORONARY ANGIOGRAPHY N/A 03/22/2020   Procedure: LEFT HEART CATH AND CORONARY ANGIOGRAPHY;  Surgeon: Swazil[place], Peter M, MD;  Location: [facility] INVASIVE CV LAB;  Service: Cardiovascular;  Laterality:  N/A;   LOWER EXTREMITY ANGIOGRAPHY Bilateral 06/15/2019   Procedure: Lower Extremity Angiography;  Surgeon: Court Dorn PARAS, MD;  Location: [facility] INVASIVE CV LAB;  Service: Cardiovascular;  Laterality: Bilateral;   LOWER EXTREMITY ANGIOGRAPHY Right 08/03/2019   Procedure: LOWER EXTREMITY ANGIOGRAPHY;  Surgeon: Court Dorn PARAS, MD;  [facility];  Service: Cardiovascular;  Laterality: Right;   LOWER EXTREMITY ANGIOGRAPHY N/A 09/07/2021   Procedure: LOWER EXTREMITY ANGIOGRAPHY;  Surgeon: Gretta Lonni PARAS, MD;  [facility];  Service: Cardiovascular;  Laterality: N/A;   PATCH ANGIOPLASTY  Right 07/21/2019   Procedure: Patch Angioplasty Right Femoral Artery;  Surgeon: Serene Gaile ORN, MD;  Location: [facility] OR;  Service: Vascular;  Laterality: Right;   PERIPHERAL INTRAVASCULAR LITHOTRIPSY  11/25/2019   Procedure: INTRAVASCULAR LITHOTRIPSY;  Surgeon: Darron Deatrice LABOR, MD;  [facility];  Service: Cardiovascular;;  LT. SFA   PERIPHERAL VASCULAR ATHERECTOMY  08/03/2019   Procedure: PERIPHERAL VASCULAR ATHERECTOMY;  Surgeon: Court Dorn PARAS, MD;  Location: [facility] INVASIVE CV LAB;  Service: Cardiovascular;;  right SFA, right TP trunk   PERIPHERAL VASCULAR ATHERECTOMY  11/25/2019   Procedure: PERIPHERAL VASCULAR ATHERECTOMY;  Surgeon: Darron Deatrice LABOR, MD;  [facility];  Service: Cardiovascular;;  Lt.  POPLITEAL and AT   PERIPHERAL VASCULAR BALLOON ANGIOPLASTY Left 06/15/2019   Procedure: PERIPHERAL VASCULAR BALLOON ANGIOPLASTY;  Surgeon: Court Dorn PARAS, MD;  [facility];  Service: Cardiovascular;  Laterality: Left;  SFA UNSUCCESSFUL UNABLE TO CROSS LESION   PERIPHERAL VASCULAR BALLOON ANGIOPLASTY  08/03/2019   Procedure: PERIPHERAL VASCULAR BALLOON ANGIOPLASTY;  Surgeon: Court Dorn PARAS, MD;  [facility];  Service: Cardiovascular;;  right SFA, Right TP trunk   PERIPHERAL VASCULAR BALLOON ANGIOPLASTY  09/07/2021   Procedure: PERIPHERAL VASCULAR BALLOON ANGIOPLASTY;  Surgeon: Gretta Lonni PARAS, MD;  [facility];  Service: Cardiovascular;;   PERIPHERAL VASCULAR BALLOON ANGIOPLASTY Right 10/13/2021   Procedure: PERIPHERAL VASCULAR BALLOON ANGIOPLASTY;  Surgeon: Magda Debby SAILOR, MD;  [facility];  Service: Cardiovascular;  Laterality: Right;   PERIPHERAL VASCULAR BALLOON ANGIOPLASTY  11/09/2022   Procedure: PERIPHERAL VASCULAR BALLOON ANGIOPLASTY;  Surgeon: Magda Debby SAILOR, MD;  [facility];  Service: Cardiovascular;;  fem-pop bypass and AT   PORTACATH PLACEMENT Left  04/29/2023   Procedure: INSERTION PORT-A-CATH;  Surgeon: Dasie Leonor CROME, MD;  Location: [facility] ORS;  Service: General;  Laterality: Left;   PORTACATH PLACEMENT N/A 07/12/2023   Procedure: REVISION PORT-A-CATH;  Surgeon: Dasie Leonor CROME, MD;  Location: [facility] OR;  Service: General;  Laterality: N/A;   REMOVAL OF STONES  04/07/2023   Procedure: REMOVAL OF STONES;  Surgeon: Rosalie Kitchens, MD;  Location: [facility] ENDOSCOPY;  Service: Gastroenterology;;   ANNETT  04/07/2023   Procedure: ANNETT;  Surgeon: Rosalie Kitchens, MD;  Location: [facility] ENDOSCOPY;  Service: Gastroenterology;;   TEE WITHOUT CARDIOVERSION N/A 03/24/2020   Procedure: TRANSESOPHAGEAL ECHOCARDIOGRAM (TEE);  Surgeon: German Bartlett PEDLAR, MD;  Location: [facility] OR;  Service: Open Heart Surgery;  Laterality: N/A;   TONSILLECTOMY     and adenoidectomy   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/2019   Procedure: TRANSMETATARSAL AMPUTATION;  Surgeon: Gershon Donnice SAUNDERS, DPM;  Location: [facility] OR;  Service: Podiatry;  Laterality: Right;   TRANSMETATARSAL AMPUTATION Left 05/11/2021   Procedure: LEFT TRANSMETATARSAL AMPUTATION;  Surgeon: Sheree Penne Lonni, MD;  Location: [facility] OR;  Service: Vascular;  Laterality: Left;   TUBAL LIGATION  Prior to Admission medications   Medication Sig Start Date End Date Taking? Authorizing Provider  acetaminophen  (TYLENOL ) 500 MG tablet Take 1,000 mg by mouth every 6 (six) hours as needed for moderate pain (pain score 4-6), fever or mild pain (pain score 1-3).    [provider]  brimonidine  (ALPHAGAN ) 0.2 % ophthalmic solution Place 1 drop into the right eye 2 (two) times daily. 01/19/21   [provider]  ferrous sulfate  325 (65 FE) MG EC tablet Take 1 tablet (325 mg total) by mouth daily before breakfast. To be taken with a cup of orange juice on an otherwise empty stomach if possible 04/16/24 04/16/25  Shalhoub, Zachary PARAS, MD  furosemide  (LASIX ) 20 MG tablet Take 1 tablet (20 mg total) by mouth daily.  06/01/24   Lanny Callander, MD  hydrochlorothiazide  (HYDRODIURIL ) 12.5 MG tablet Take 12.5 mg by mouth daily.    [provider]  JARDIANCE  25 MG TABS tablet Take 25 mg by mouth daily. 04/20/24   [provider]  magnesium  oxide (MAG-OX) 400 (240 Mg) MG tablet Take 400 mg by mouth 2 (two) times daily.    [provider]  Metformin  HCl 500 MG/5ML SOLN Take 10 mLs by mouth in the morning and at bedtime. 04/20/24   [provider]  metoprolol  tartrate (LOPRESSOR ) 25 MG tablet Take 1 tablet (25 mg total) by mouth 2 (two) times daily. 02/18/24   Krishnan, Gokul, MD  mirtazapine  (REMERON ) 15 MG tablet Take 15 mg by mouth at bedtime.    [provider]  pantoprazole  (PROTONIX ) 40 MG tablet Take 1 tablet (40 mg total) by mouth 2 (two) times daily. 05/01/24 05/01/25  Adhikari, Amrit, MD  polyethylene glycol (MIRALAX  / GLYCOLAX ) 17 g packet Take 17 g by mouth daily. 02/19/24   Krishnan, Gokul, MD  Probiotic Product (PROBIOTIC PO) Take 1 capsule by mouth daily.    [provider]  Propylene Glycol (SYSTANE BALANCE) 0.6 % SOLN Place 1 drop into the left eye 3 (three) times daily as needed (dry/irritated eyes).    [provider]  rosuvastatin  (CRESTOR ) 40 MG tablet Take 40 mg by mouth at bedtime. 05/01/24   [provider]  telmisartan  (MICARDIS ) 40 MG tablet Take 40 mg by mouth daily. 03/27/24   [provider]  traZODone  (DESYREL ) 100 MG tablet Take 100 mg by mouth at bedtime. 03/12/24   [provider]  XARELTO  10 MG TABS tablet Take 1 tablet (10 mg total) by mouth daily. 06/01/24   Lanny Callander, MD    Current Facility-Administered Medications  Medication Dose Route Frequency Provider Last Rate Last Admin   0.9 %  sodium chloride  infusion (Manually program via Guardrails IV Fluids)   Intravenous Once Lemly, Tatum N, MD       0.9 %  sodium chloride  infusion (Manually program via Guardrails IV Fluids)   Intravenous Once Lemly, Tatum N, MD        0.9 %  sodium chloride  infusion (Manually program via Guardrails IV Fluids)   Intravenous Once Lemly, Tatum N, MD       pantoprazole  (PROTONIX ) injection 40 mg  40 mg Intravenous Q24H Saintclair Jasper, MD       vancomycin  (VANCOCIN ) IVPB 1000 mg/200 mL premix  1,000 mg Intravenous Once Lemly, Tatum N, MD       Current Outpatient Medications  Medication Sig Dispense Refill   acetaminophen  (TYLENOL ) 500 MG tablet Take 1,000 mg by mouth every 6 (six) hours as needed for  moderate pain (pain score 4-6), fever or mild pain (pain score 1-3).     brimonidine  (ALPHAGAN ) 0.2 % ophthalmic solution Place 1 drop into the right eye 2 (two) times daily.     ferrous sulfate  325 (65 FE) MG EC tablet Take 1 tablet (325 mg total) by mouth daily before breakfast. To be taken with a cup of orange juice on an otherwise empty stomach if possible     furosemide  (LASIX ) 20 MG tablet Take 1 tablet (20 mg total) by mouth daily. 30 tablet 0   hydrochlorothiazide  (HYDRODIURIL ) 12.5 MG tablet Take 12.5 mg by mouth daily.     JARDIANCE  25 MG TABS tablet Take 25 mg by mouth daily.     magnesium  oxide (MAG-OX) 400 (240 Mg) MG tablet Take 400 mg by mouth 2 (two) times daily.     Metformin  HCl 500 MG/5ML SOLN Take 10 mLs by mouth in the morning and at bedtime.     metoprolol  tartrate (LOPRESSOR ) 25 MG tablet Take 1 tablet (25 mg total) by mouth 2 (two) times daily.     mirtazapine  (REMERON ) 15 MG tablet Take 15 mg by mouth at bedtime.     pantoprazole  (PROTONIX ) 40 MG tablet Take 1 tablet (40 mg total) by mouth 2 (two) times daily.     polyethylene glycol (MIRALAX  / GLYCOLAX ) 17 g packet Take 17 g by mouth daily.     Probiotic Product (PROBIOTIC PO) Take 1 capsule by mouth daily.     Propylene Glycol (SYSTANE BALANCE) 0.6 % SOLN Place 1 drop into the left eye 3 (three) times daily as needed (dry/irritated eyes).     rosuvastatin  (CRESTOR ) 40 MG tablet Take 40 mg by mouth at bedtime.     telmisartan  (MICARDIS ) 40 MG tablet Take 40  mg by mouth daily.     traZODone  (DESYREL ) 100 MG tablet Take 100 mg by mouth at bedtime.     XARELTO  10 MG TABS tablet Take 1 tablet (10 mg total) by mouth daily. 30 tablet 2   Facility-Administered Medications Ordered in Other Encounters  Medication Dose Route Frequency Provider Last Rate Last Admin   0.9 %  sodium chloride  infusion (Manually program via Guardrails IV Fluids)  250 mL Intravenous Continuous Lanny Callander, MD   Stopped at 06/02/24 1000    Allergies as of 06/02/2024 - Review Complete 06/02/2024  Allergen Reaction Noted   Versed  [midazolam ] Anxiety 08/06/2019   Bactrim  [sulfamethoxazole -trimethoprim ] Other (See Comments) 08/06/2019   Augmentin [amoxicillin-pot clavulanate] Other (See Comments) 11/14/2021   Demerol [meperidine]  03/30/2013   Meperidine hcl  01/29/2022   Scopolamine  05/12/2014   Tramadol  Other (See Comments) 04/27/2021    Family History  Problem Relation Age of Onset   Cancer - Prostate Father    Cancer - Colon Father    Stroke Mother    Hypertension Mother    Hyperlipidemia Mother    Melanoma Brother     Social History   Socioeconomic History   Marital status: Widowed    Spouse name: Not on file   Number of children: 3   Years of education: Not on file   Highest education level: Not on file  Occupational History   Not on file  Tobacco Use   Smoking status: Former    Current packs/day: 0.00    Types: Cigarettes    Quit date: 03/31/1972    Years since quitting: 52.2   Smokeless tobacco: Never  Vaping Use   Vaping status: Never Used  Substance and Sexual Activity   Alcohol  use: No   Drug use: No   Sexual activity: Not on file  Other Topics Concern   Not on file  Social History Narrative   Not on file   Social Drivers of Health   Financial Resource Strain: Not on file  Food Insecurity: No Food Insecurity (05/23/2024)   Hunger Vital Sign    Worried About Running Out of Food in the Last Year: Never true    Ran Out of Food in the Last  Year: Never true  Transportation Needs: No Transportation Needs (05/23/2024)   PRAPARE - Administrator, Civil Service (Medical): No    Lack of Transportation (Non-Medical): No  Physical Activity: Not on file  Stress: Not on file  Social Connections: Moderately Isolated (05/23/2024)   Social Connection and Isolation Panel    Frequency of Communication with Friends and Family: More than three times a week    Frequency of Social Gatherings with Friends and Family: Once a week    Attends Religious Services: Never    Database administrator or Organizations: Yes    Attends Banker Meetings: Never    Marital Status: Widowed  Intimate Partner Violence: Not At Risk (05/23/2024)   Humiliation, Afraid, Rape, and Kick questionnaire    Fear of Current or Ex-Partner: No    Emotionally Abused: No    Physically Abused: No    Sexually Abused: No    Review of Systems: As per HPI  Physical Exam: Vital signs in last 24 hours: Temp:  [94.7 F (34.8 C)-98 F (36.7 C)] 97.5 F (36.4 C) (09/09 1400) Pulse Rate:  [98-116] 116 (09/09 1400) Resp:  [0-24] 16 (09/09 1400) BP: (63-124)/(30-61) 83/41 (09/09 1400) SpO2:  [95 %-100 %] 100 % (09/09 1400)    General:   Deconditioned, elderly, weak appearing  Head: Loss of hair, normocephalic and atraumatic. Eyes:  Sclera clear, no icterus.  Prominent pallor. Ears:  Normal auditory acuity. Nose:  No deformity, discharge,  or lesions. Mouth:  No deformity or lesions.  Oropharynx pink & moist. Neck:  Supple; no masses or thyromegaly. Lungs:  Clear throughout to auscultation.   No wheezes, crackles, or rhonchi. No acute distress. Heart: Tachycardic Extremities: Significant pitting edema of bilateral upper and bilateral lower extremities, amputation of toes in bilateral bilateral feet. Neurologic:  Alert and  oriented x4;  grossly normal neurologically. Skin:  Intact without significant lesions or rashes. Psych:  Alert and cooperative.  Normal mood and affect. Abdomen:  Soft, nontender and nondistended. No masses, hepatosplenomegaly or hernias noted. Normal bowel sounds, without guarding, and without rebound.         Lab Results: Recent Labs    06/01/24 1333 06/02/24 1029  WBC 12.9* 23.4*  HGB 6.7* 5.7*  HCT 20.4* 19.1*  PLT 237 260   BMET Recent Labs    06/01/24 1333 06/02/24 1029  NA 139 139  K 3.7 4.1  CL 106 103  CO2 23 11*  GLUCOSE 222* 183*  BUN 41* 41*  CREATININE 1.65* 1.76*  CALCIUM  7.7* 8.9   LFT Recent Labs    06/02/24 1029  PROT 3.9*  ALBUMIN  1.8*  AST 36  ALT 19  ALKPHOS 175*  BILITOT 0.3   PT/INR No results for input(s): LABPROT, INR in the last 72 hours.  Studies/Results: DG Chest Portable 1 View Result Date: 06/02/2024 CLINICAL DATA:  Infectious evaluation. EXAM: PORTABLE CHEST 1 VIEW COMPARISON:  05/12/2024. FINDINGS: The  heart size and mediastinal contours are unchanged. Prior median sternotomy and CABG. Left chest Port-A-Cath is unchanged. No focal consolidation, pleural effusion, or pneumothorax. No acute osseous abnormality. IMPRESSION: No acute cardiopulmonary findings. Electronically Signed   By: Harrietta Sherry M.D.   On: 06/02/2024 11:42    Impression: Hematemesis On Xarelto , last yesterday Anemia  Gallbladder cancer, postchemotherapy, radiation and immunotherapy  Malnutrition with significant edema of upper and lower extremities  History of chronic ulcers of back  Lab abnormalities: Hemoglobin 5.7, MCV 103.8, WBC 23.4, acidotic, bicarb 11, elevated blood sugar 183, elevated BUN/creatinine 41/1.76 and low GFR 28, low total protein 3.9, low albumin  1.8, slightly elevated ALP 175, PT/INR was 41.3/4.1 on 05/23/2023  Plan: 2 units PRBC transfusion has been ordered which should help with hypotension and tachycardia.  She has been given Protonix  40 mg IV x 1 I have started on Protonix  40 mg every 12 hours.  I will start on clear liquid diet and plan EGD in  a.m. for evaluation of  hematemesis.  Guarded prognosis.   LOS: 0 days   [name], MD  06/02/2024, 2:09 PM

## 2024-06-02 NOTE — Progress Notes (Signed)
 Ms. Shamira Toutant arrived to her infusion appointment this morning (06/02/24) to receive two units of blood. Her vitals showed incredibly low blood pressure, elevated HR, and O2 sensors were unable to pick up a proper reading. I messaged Dr. Lanny about these vitals as well as her lethargic demeanor and Dr. Lanny came to infusion for an assessment.   Per Dr. Lanny I double checked BP with a manual blood pressure cuff and it still read BP around  65/35. Per Dr. Lanny I set her saline rate to 999 to finish out her 250 mL bag. Dr. Demetra plan was to finish the saline then recheck Ms. Belmar's BP- if the SBP read <90 then she will send her to the ED. We informed the charge nurse of this and we decided to call a rapid for further assessment. Heron came for further assessment and it was determined between her and Dr. Lanny that we would continue with the two units of blood. We gave Ms. Petrey her premedications of tylenol  and benadryl  and about 5 minutes later she began to throw up blood. Barbara then arranged for Ms. Donahey to be admitted to the ED and she left under guarded care of the rapid team.

## 2024-06-02 NOTE — Plan of Care (Signed)

## 2024-06-02 NOTE — ED Notes (Signed)
 IV antibiotics delayed in order to transfuse blood.

## 2024-06-02 NOTE — Plan of Care (Signed)
 Informed by stepdown RN that patient had approximately 1 L bloody emesis. She remains hemodynamically stable, with low but stable blood pressure.  Discussed with GI. Make n.p.o. now. Will give vitamin K  now, recheck INR in the morning. Will give a second unit of FFP and reversal agent for Xarelto .

## 2024-06-02 NOTE — ED Notes (Signed)
 Blood bank has plasma ready for this patient.  Rexene, RN informed.

## 2024-06-02 NOTE — Consult Note (Signed)
 [facility] Consult  Referring Provider: ER/Dr. Lavonia Jackson Primary Care Physician:  Chrystal Lamarr RAMAN, MD Primary Gastroenterologist: Dr. Rosalie  Reason for Consultation: Anemia, hematemesis  HPI: [name] is a 83 y.o. female with past medical history of gallbladder cancer with liver metastases, ascites, status post chemotherapy and radiation recently started on immunotherapy was advised to get 2 units of PRBC transfusion when labs yesterday showed a hemoglobin of 6.7. She came to ER because of feeling weak, was found to have low blood pressure and also reports 1 episode of hematemesis which patient describes as throwing 2 tablespoon of bright red blood.  She is on Xarelto  and last dose was today morning.  Patient states that his stools have been black, intermittently dark and also notices some blood in the stool from hemorrhoids. She was recently seen as a consult on 05/23/2024 for symptomatic anemia which was thought to be multifactorial: Sacral ulcer with chronic oozing, on Xarelto , gallbladder cancer with metastases, chemotherapy, radiation, hemorrhoidal blood loss.  Previous GI workup: ERCP 04/07/2023, jaundice, Dr. Rosalie: 10 French x 12 cm plastic biliary stent placed in CBD Colonoscopy, 2012, Dr. 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 There is a small calcified mass in the L:A near the LA appendage. No valvular masses seen with associated mitral annular calcification. LA Volume/ BSA27.4 ml/m2 No AS. Right ventricular systolic pressure is elevated at .   Jaundice    Myocardial infarction [facility])    June 2021   Neuromuscular disorder (HCC)    neuropathy in bilateral feet   Peripheral vascular disease (HCC)    Pneumonia    not hosp.    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  PTCAs with drug coated balloon Lt ant tibial artery and Lt popliteal artery  11/26/2019    Past Surgical History:  Procedure Laterality Date   ABDOMINAL AORTAGRAM  11/25/2019   ABDOMINAL AORTOGRAM W/LOWER EXTREMITY    ABDOMINAL AORTOGRAM N/A 06/15/2019   Procedure: ABDOMINAL AORTOGRAM;  Surgeon: Court Dorn PARAS, MD;  [facility]: [facility];  Service: Cardiovascular;  Laterality: N/A;   ABDOMINAL AORTOGRAM W/LOWER EXTREMITY N/A 11/25/2019   Procedure: ABDOMINAL AORTOGRAM W/LOWER EXTREMITY;  Surgeon: Darron Deatrice LABOR, MD;  [facility]: [facility];  Service: Cardiovascular;  Laterality: N/A;  Lt leg   ABDOMINAL AORTOGRAM W/LOWER EXTREMITY N/A 08/01/2020   Procedure: ABDOMINAL AORTOGRAM W/LOWER EXTREMITY;  Surgeon: Court Dorn PARAS, MD;  [facility];  Service: Cardiovascular;  Laterality: N/A;   ABDOMINAL AORTOGRAM W/LOWER EXTREMITY N/A 04/28/2021  Procedure: ABDOMINAL AORTOGRAM W/LOWER EXTREMITY;  Surgeon: Harvey Carlin BRAVO, MD;  Location: [facility] INVASIVE CV LAB;  Service: Cardiovascular;  Laterality: N/A;   ABDOMINAL AORTOGRAM W/LOWER EXTREMITY N/A 10/13/2021   Procedure: ABDOMINAL AORTOGRAM W/LOWER EXTREMITY;  Surgeon: Magda Debby SAILOR, MD;  [facility];  Service: Cardiovascular;  Laterality: N/A;    ABDOMINAL AORTOGRAM W/LOWER EXTREMITY N/A 11/09/2022   Procedure: ABDOMINAL AORTOGRAM W/LOWER EXTREMITY;  Surgeon: Magda Debby SAILOR, MD;  [facility];  Service: Cardiovascular;  Laterality: N/A;   ABDOMINAL AORTOGRAM W/LOWER EXTREMITY N/A 11/16/2022   Procedure: ABDOMINAL AORTOGRAM W/LOWER EXTREMITY;  Surgeon: Magda Debby SAILOR, MD;  [facility];  Service: Cardiovascular;  Laterality: N/A;   AMPUTATION TOE Left 05/03/2021   Procedure: AMPUTATION OF THIRD LEFT  TOE;  Surgeon: Magda Debby SAILOR, MD;  Location: [facility] OR;  Service: Vascular;  Laterality: Left;   APPENDECTOMY     APPLICATION OF WOUND VAC Right 07/21/2019   Procedure: Application Of Prevena Wound Vac Right Groin;  Surgeon: Serene Gaile ORN, MD;  Location: [facility] OR;  Service: Vascular;  Laterality: Right;   BILIARY BRUSHING  04/07/2023   Procedure: BILIARY BRUSHING;  Surgeon: Rosalie Kitchens, MD;  Location: [facility] ENDOSCOPY;  Service: Gastroenterology;;   BILIARY STENT PLACEMENT  04/07/2023   Procedure: BILIARY STENT PLACEMENT;  Surgeon: Rosalie Kitchens, MD;  Location: [facility] ENDOSCOPY;  Service: Gastroenterology;;   CARPAL TUNNEL RELEASE Left    CARPAL TUNNEL RELEASE Right    CESAREAN SECTION     x 2   CHOLECYSTECTOMY N/A 10/12/2022   Procedure: LAPAROSCOPIC PARTIAL FENESTRATING CHOLECYSTECTOMY;  Surgeon: Dasie Leonor CROME, MD;  [facility];  Service: General;  Laterality: N/A;   COLONOSCOPY     CORONARY ARTERY BYPASS GRAFT N/A 03/24/2020   Procedure: CORONARY ARTERY BYPASS GRAFTING (CABG) using LIMA to LAD (m); RIMA to RAMUS; Endoscopic Right Greater Saphenous Vein: SVG to Diag1; SVG to PLB (right); and SVG to PL (left).;  Surgeon: German Bartlett PEDLAR, MD;  [facility];  Service: Open Heart Surgery;  Laterality: N/A;  BILATERAL IMA   ENDARTERECTOMY FEMORAL Right 07/21/2019   Procedure: RIGHT ENDARTERECTOMY FEMORAL WITH PATCH ANGIOPLASTY;  Surgeon: Serene Gaile ORN, MD;  [facility];  Service: Vascular;  Laterality:  Right;   ENDARTERECTOMY FEMORAL Right 08/16/2020   Procedure: RIGHT FEMORAL ENDARTERECTOMY;  Surgeon: Sheree Penne Bruckner, MD;  Location: [facility] OR;  Service: Vascular;  Laterality: Right;   ENDOVEIN HARVEST OF GREATER SAPHENOUS VEIN Right 03/24/2020   Procedure: ENDOVEIN HARVEST OF GREATER SAPHENOUS VEIN;  Surgeon: German Bartlett PEDLAR, MD;  [facility];  Service: Open Heart Surgery;  Laterality: Right;   ERCP N/A 04/07/2023   Procedure: ENDOSCOPIC RETROGRADE CHOLANGIOPANCREATOGRAPHY (ERCP);  Surgeon: Rosalie Kitchens, MD;  Location: [facility] ENDOSCOPY;  Service: Gastroenterology;  Laterality: N/A;   EYE SURGERY     cataract removal bilaterally   FEMORAL-POPLITEAL BYPASS GRAFT Right 08/16/2020   Procedure: BYPASS GRAFT FEMORAL-POPLITEAL ARTERY;  Surgeon: Sheree Penne Bruckner, MD;  Location: [facility] OR;  Service: Vascular;  Laterality: Right;   FEMORAL-POPLITEAL BYPASS GRAFT Left 05/03/2021   Procedure: LEFT FEMORAL TO BELOW KNEE POPLITEAL ARTERY BYPASS GRAFTING WITH 6MMX80 PTFE GRAFT;  Surgeon: Magda Debby SAILOR, MD;  [facility];  Service: Vascular;  Laterality: Left;   LEFT HEART CATH AND CORONARY ANGIOGRAPHY N/A 03/22/2020   Procedure: LEFT HEART CATH AND CORONARY ANGIOGRAPHY;  Surgeon: Swazil[place], Peter M, MD;  Location: [facility] INVASIVE CV LAB;  Service: Cardiovascular;  Laterality:  N/A;   LOWER EXTREMITY ANGIOGRAPHY Bilateral 06/15/2019   Procedure: Lower Extremity Angiography;  Surgeon: Court Dorn PARAS, MD;  Location: [facility] INVASIVE CV LAB;  Service: Cardiovascular;  Laterality: Bilateral;   LOWER EXTREMITY ANGIOGRAPHY Right 08/03/2019   Procedure: LOWER EXTREMITY ANGIOGRAPHY;  Surgeon: Court Dorn PARAS, MD;  [facility];  Service: Cardiovascular;  Laterality: Right;   LOWER EXTREMITY ANGIOGRAPHY N/A 09/07/2021   Procedure: LOWER EXTREMITY ANGIOGRAPHY;  Surgeon: Gretta Lonni PARAS, MD;  [facility];  Service: Cardiovascular;  Laterality: N/A;   PATCH ANGIOPLASTY  Right 07/21/2019   Procedure: Patch Angioplasty Right Femoral Artery;  Surgeon: Serene Gaile ORN, MD;  Location: [facility] OR;  Service: Vascular;  Laterality: Right;   PERIPHERAL INTRAVASCULAR LITHOTRIPSY  11/25/2019   Procedure: INTRAVASCULAR LITHOTRIPSY;  Surgeon: Darron Deatrice LABOR, MD;  [facility];  Service: Cardiovascular;;  LT. SFA   PERIPHERAL VASCULAR ATHERECTOMY  08/03/2019   Procedure: PERIPHERAL VASCULAR ATHERECTOMY;  Surgeon: Court Dorn PARAS, MD;  Location: [facility] INVASIVE CV LAB;  Service: Cardiovascular;;  right SFA, right TP trunk   PERIPHERAL VASCULAR ATHERECTOMY  11/25/2019   Procedure: PERIPHERAL VASCULAR ATHERECTOMY;  Surgeon: Darron Deatrice LABOR, MD;  [facility];  Service: Cardiovascular;;  Lt.  POPLITEAL and AT   PERIPHERAL VASCULAR BALLOON ANGIOPLASTY Left 06/15/2019   Procedure: PERIPHERAL VASCULAR BALLOON ANGIOPLASTY;  Surgeon: Court Dorn PARAS, MD;  [facility];  Service: Cardiovascular;  Laterality: Left;  SFA UNSUCCESSFUL UNABLE TO CROSS LESION   PERIPHERAL VASCULAR BALLOON ANGIOPLASTY  08/03/2019   Procedure: PERIPHERAL VASCULAR BALLOON ANGIOPLASTY;  Surgeon: Court Dorn PARAS, MD;  [facility];  Service: Cardiovascular;;  right SFA, Right TP trunk   PERIPHERAL VASCULAR BALLOON ANGIOPLASTY  09/07/2021   Procedure: PERIPHERAL VASCULAR BALLOON ANGIOPLASTY;  Surgeon: Gretta Lonni PARAS, MD;  [facility];  Service: Cardiovascular;;   PERIPHERAL VASCULAR BALLOON ANGIOPLASTY Right 10/13/2021   Procedure: PERIPHERAL VASCULAR BALLOON ANGIOPLASTY;  Surgeon: Magda Debby SAILOR, MD;  [facility];  Service: Cardiovascular;  Laterality: Right;   PERIPHERAL VASCULAR BALLOON ANGIOPLASTY  11/09/2022   Procedure: PERIPHERAL VASCULAR BALLOON ANGIOPLASTY;  Surgeon: Magda Debby SAILOR, MD;  [facility];  Service: Cardiovascular;;  fem-pop bypass and AT   PORTACATH PLACEMENT Left  04/29/2023   Procedure: INSERTION PORT-A-CATH;  Surgeon: Dasie Leonor CROME, MD;  Location: [facility] ORS;  Service: General;  Laterality: Left;   PORTACATH PLACEMENT N/A 07/12/2023   Procedure: REVISION PORT-A-CATH;  Surgeon: Dasie Leonor CROME, MD;  Location: [facility] OR;  Service: General;  Laterality: N/A;   REMOVAL OF STONES  04/07/2023   Procedure: REMOVAL OF STONES;  Surgeon: Rosalie Kitchens, MD;  Location: [facility] ENDOSCOPY;  Service: Gastroenterology;;   ANNETT  04/07/2023   Procedure: ANNETT;  Surgeon: Rosalie Kitchens, MD;  Location: [facility] ENDOSCOPY;  Service: Gastroenterology;;   TEE WITHOUT CARDIOVERSION N/A 03/24/2020   Procedure: TRANSESOPHAGEAL ECHOCARDIOGRAM (TEE);  Surgeon: German Bartlett PEDLAR, MD;  Location: [facility] OR;  Service: Open Heart Surgery;  Laterality: N/A;   TONSILLECTOMY     and adenoidectomy   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/2019   Procedure: TRANSMETATARSAL AMPUTATION;  Surgeon: Gershon Donnice SAUNDERS, DPM;  Location: [facility] OR;  Service: Podiatry;  Laterality: Right;   TRANSMETATARSAL AMPUTATION Left 05/11/2021   Procedure: LEFT TRANSMETATARSAL AMPUTATION;  Surgeon: Sheree Penne Lonni, MD;  Location: [facility] OR;  Service: Vascular;  Laterality: Left;   TUBAL LIGATION  Prior to Admission medications   Medication Sig Start Date End Date Taking? Authorizing Provider  acetaminophen  (TYLENOL ) 500 MG tablet Take 1,000 mg by mouth every 6 (six) hours as needed for moderate pain (pain score 4-6), fever or mild pain (pain score 1-3).    [provider]  brimonidine  (ALPHAGAN ) 0.2 % ophthalmic solution Place 1 drop into the right eye 2 (two) times daily. 01/19/21   [provider]  ferrous sulfate  325 (65 FE) MG EC tablet Take 1 tablet (325 mg total) by mouth daily before breakfast. To be taken with a cup of orange juice on an otherwise empty stomach if possible 04/16/24 04/16/25  Shalhoub, Zachary PARAS, MD  furosemide  (LASIX ) 20 MG tablet Take 1 tablet (20 mg total) by mouth daily.  06/01/24   Lanny Callander, MD  hydrochlorothiazide  (HYDRODIURIL ) 12.5 MG tablet Take 12.5 mg by mouth daily.    [provider]  JARDIANCE  25 MG TABS tablet Take 25 mg by mouth daily. 04/20/24   [provider]  magnesium  oxide (MAG-OX) 400 (240 Mg) MG tablet Take 400 mg by mouth 2 (two) times daily.    [provider]  Metformin  HCl 500 MG/5ML SOLN Take 10 mLs by mouth in the morning and at bedtime. 04/20/24   [provider]  metoprolol  tartrate (LOPRESSOR ) 25 MG tablet Take 1 tablet (25 mg total) by mouth 2 (two) times daily. 02/18/24   Krishnan, Gokul, MD  mirtazapine  (REMERON ) 15 MG tablet Take 15 mg by mouth at bedtime.    [provider]  pantoprazole  (PROTONIX ) 40 MG tablet Take 1 tablet (40 mg total) by mouth 2 (two) times daily. 05/01/24 05/01/25  Adhikari, Amrit, MD  polyethylene glycol (MIRALAX  / GLYCOLAX ) 17 g packet Take 17 g by mouth daily. 02/19/24   Krishnan, Gokul, MD  Probiotic Product (PROBIOTIC PO) Take 1 capsule by mouth daily.    [provider]  Propylene Glycol (SYSTANE BALANCE) 0.6 % SOLN Place 1 drop into the left eye 3 (three) times daily as needed (dry/irritated eyes).    [provider]  rosuvastatin  (CRESTOR ) 40 MG tablet Take 40 mg by mouth at bedtime. 05/01/24   [provider]  telmisartan  (MICARDIS ) 40 MG tablet Take 40 mg by mouth daily. 03/27/24   [provider]  traZODone  (DESYREL ) 100 MG tablet Take 100 mg by mouth at bedtime. 03/12/24   [provider]  XARELTO  10 MG TABS tablet Take 1 tablet (10 mg total) by mouth daily. 06/01/24   Lanny Callander, MD    Current Facility-Administered Medications  Medication Dose Route Frequency Provider Last Rate Last Admin   0.9 %  sodium chloride  infusion (Manually program via Guardrails IV Fluids)   Intravenous Once Lemly, Tatum N, MD       0.9 %  sodium chloride  infusion (Manually program via Guardrails IV Fluids)   Intravenous Once Lemly, Tatum N, MD        0.9 %  sodium chloride  infusion (Manually program via Guardrails IV Fluids)   Intravenous Once Lemly, Tatum N, MD       pantoprazole  (PROTONIX ) injection 40 mg  40 mg Intravenous Q24H Saintclair Jasper, MD       vancomycin  (VANCOCIN ) IVPB 1000 mg/200 mL premix  1,000 mg Intravenous Once Lemly, Tatum N, MD       Current Outpatient Medications  Medication Sig Dispense Refill   acetaminophen  (TYLENOL ) 500 MG tablet Take 1,000 mg by mouth every 6 (six) hours as needed for  moderate pain (pain score 4-6), fever or mild pain (pain score 1-3).     brimonidine  (ALPHAGAN ) 0.2 % ophthalmic solution Place 1 drop into the right eye 2 (two) times daily.     ferrous sulfate  325 (65 FE) MG EC tablet Take 1 tablet (325 mg total) by mouth daily before breakfast. To be taken with a cup of orange juice on an otherwise empty stomach if possible     furosemide  (LASIX ) 20 MG tablet Take 1 tablet (20 mg total) by mouth daily. 30 tablet 0   hydrochlorothiazide  (HYDRODIURIL ) 12.5 MG tablet Take 12.5 mg by mouth daily.     JARDIANCE  25 MG TABS tablet Take 25 mg by mouth daily.     magnesium  oxide (MAG-OX) 400 (240 Mg) MG tablet Take 400 mg by mouth 2 (two) times daily.     Metformin  HCl 500 MG/5ML SOLN Take 10 mLs by mouth in the morning and at bedtime.     metoprolol  tartrate (LOPRESSOR ) 25 MG tablet Take 1 tablet (25 mg total) by mouth 2 (two) times daily.     mirtazapine  (REMERON ) 15 MG tablet Take 15 mg by mouth at bedtime.     pantoprazole  (PROTONIX ) 40 MG tablet Take 1 tablet (40 mg total) by mouth 2 (two) times daily.     polyethylene glycol (MIRALAX  / GLYCOLAX ) 17 g packet Take 17 g by mouth daily.     Probiotic Product (PROBIOTIC PO) Take 1 capsule by mouth daily.     Propylene Glycol (SYSTANE BALANCE) 0.6 % SOLN Place 1 drop into the left eye 3 (three) times daily as needed (dry/irritated eyes).     rosuvastatin  (CRESTOR ) 40 MG tablet Take 40 mg by mouth at bedtime.     telmisartan  (MICARDIS ) 40 MG tablet Take 40  mg by mouth daily.     traZODone  (DESYREL ) 100 MG tablet Take 100 mg by mouth at bedtime.     XARELTO  10 MG TABS tablet Take 1 tablet (10 mg total) by mouth daily. 30 tablet 2   Facility-Administered Medications Ordered in Other Encounters  Medication Dose Route Frequency Provider Last Rate Last Admin   0.9 %  sodium chloride  infusion (Manually program via Guardrails IV Fluids)  250 mL Intravenous Continuous Lanny Callander, MD   Stopped at 06/02/24 1000    Allergies as of 06/02/2024 - Review Complete 06/02/2024  Allergen Reaction Noted   Versed  [midazolam ] Anxiety 08/06/2019   Bactrim  [sulfamethoxazole -trimethoprim ] Other (See Comments) 08/06/2019   Augmentin [amoxicillin-pot clavulanate] Other (See Comments) 11/14/2021   Demerol [meperidine]  03/30/2013   Meperidine hcl  01/29/2022   Scopolamine  05/12/2014   Tramadol  Other (See Comments) 04/27/2021    Family History  Problem Relation Age of Onset   Cancer - Prostate Father    Cancer - Colon Father    Stroke Mother    Hypertension Mother    Hyperlipidemia Mother    Melanoma Brother     Social History   Socioeconomic History   Marital status: Widowed    Spouse name: Not on file   Number of children: 3   Years of education: Not on file   Highest education level: Not on file  Occupational History   Not on file  Tobacco Use   Smoking status: Former    Current packs/day: 0.00    Types: Cigarettes    Quit date: 03/31/1972    Years since quitting: 52.2   Smokeless tobacco: Never  Vaping Use   Vaping status: Never Used  Substance and Sexual Activity   Alcohol  use: No   Drug use: No   Sexual activity: Not on file  Other Topics Concern   Not on file  Social History Narrative   Not on file   Social Drivers of Health   Financial Resource Strain: Not on file  Food Insecurity: No Food Insecurity (05/23/2024)   Hunger Vital Sign    Worried About Running Out of Food in the Last Year: Never true    Ran Out of Food in the Last  Year: Never true  Transportation Needs: No Transportation Needs (05/23/2024)   PRAPARE - Administrator, Civil Service (Medical): No    Lack of Transportation (Non-Medical): No  Physical Activity: Not on file  Stress: Not on file  Social Connections: Moderately Isolated (05/23/2024)   Social Connection and Isolation Panel    Frequency of Communication with Friends and Family: More than three times a week    Frequency of Social Gatherings with Friends and Family: Once a week    Attends Religious Services: Never    Database administrator or Organizations: Yes    Attends Banker Meetings: Never    Marital Status: Widowed  Intimate Partner Violence: Not At Risk (05/23/2024)   Humiliation, Afraid, Rape, and Kick questionnaire    Fear of Current or Ex-Partner: No    Emotionally Abused: No    Physically Abused: No    Sexually Abused: No    Review of Systems: As per HPI  Physical Exam: Vital signs in last 24 hours: Temp:  [94.7 F (34.8 C)-98 F (36.7 C)] 97.5 F (36.4 C) (09/09 1400) Pulse Rate:  [98-116] 116 (09/09 1400) Resp:  [0-24] 16 (09/09 1400) BP: (63-124)/(30-61) 83/41 (09/09 1400) SpO2:  [95 %-100 %] 100 % (09/09 1400)    General:   Deconditioned, elderly, weak appearing  Head: Loss of hair, normocephalic and atraumatic. Eyes:  Sclera clear, no icterus.  Prominent pallor. Ears:  Normal auditory acuity. Nose:  No deformity, discharge,  or lesions. Mouth:  No deformity or lesions.  Oropharynx pink & moist. Neck:  Supple; no masses or thyromegaly. Lungs:  Clear throughout to auscultation.   No wheezes, crackles, or rhonchi. No acute distress. Heart: Tachycardic Extremities: Significant pitting edema of bilateral upper and bilateral lower extremities, amputation of toes in bilateral bilateral feet. Neurologic:  Alert and  oriented x4;  grossly normal neurologically. Skin:  Intact without significant lesions or rashes. Psych:  Alert and cooperative.  Normal mood and affect. Abdomen:  Soft, nontender and nondistended. No masses, hepatosplenomegaly or hernias noted. Normal bowel sounds, without guarding, and without rebound.         Lab Results: Recent Labs    06/01/24 1333 06/02/24 1029  WBC 12.9* 23.4*  HGB 6.7* 5.7*  HCT 20.4* 19.1*  PLT 237 260   BMET Recent Labs    06/01/24 1333 06/02/24 1029  NA 139 139  K 3.7 4.1  CL 106 103  CO2 23 11*  GLUCOSE 222* 183*  BUN 41* 41*  CREATININE 1.65* 1.76*  CALCIUM  7.7* 8.9   LFT Recent Labs    06/02/24 1029  PROT 3.9*  ALBUMIN  1.8*  AST 36  ALT 19  ALKPHOS 175*  BILITOT 0.3   PT/INR No results for input(s): LABPROT, INR in the last 72 hours.  Studies/Results: DG Chest Portable 1 View Result Date: 06/02/2024 CLINICAL DATA:  Infectious evaluation. EXAM: PORTABLE CHEST 1 VIEW COMPARISON:  05/12/2024. FINDINGS: The  heart size and mediastinal contours are unchanged. Prior median sternotomy and CABG. Left chest Port-A-Cath is unchanged. No focal consolidation, pleural effusion, or pneumothorax. No acute osseous abnormality. IMPRESSION: No acute cardiopulmonary findings. Electronically Signed   By: Harrietta Sherry M.D.   On: 06/02/2024 11:42    Impression: Hematemesis On Xarelto , last yesterday Anemia  Gallbladder cancer, postchemotherapy, radiation and immunotherapy  Malnutrition with significant edema of upper and lower extremities  History of chronic ulcers of back  Lab abnormalities: Hemoglobin 5.7, MCV 103.8, WBC 23.4, acidotic, bicarb 11, elevated blood sugar 183, elevated BUN/creatinine 41/1.76 and low GFR 28, low total protein 3.9, low albumin  1.8, slightly elevated ALP 175, PT/INR was 41.3/4.1 on 05/23/2023  Plan: 2 units PRBC transfusion has been ordered which should help with hypotension and tachycardia.  She has been given Protonix  40 mg IV x 1 I have started on Protonix  40 mg every 12 hours.  I will start on clear liquid diet and plan EGD in  a.m. for evaluation of  hematemesis.  Guarded prognosis.   LOS: 0 days   [name], MD  06/02/2024, 2:09 PM

## 2024-06-02 NOTE — ED Notes (Signed)
 Cooling blanket applied to warm patient. Set to 100 degrees.

## 2024-06-02 NOTE — Progress Notes (Signed)
 Pharmacy Note   A consult was received from an ED physician for vancomycin  per pharmacy dosing.    The patient's profile has been reviewed for ht/wt/allergies/indication/available labs.    A one time order has been placed for vancomycin  1000 mg IV x1 .    Further antibiotics/pharmacy consults should be ordered by admitting physician if indicated.                       Thank you,  Perline Awe, PharmD, BCPS 06/02/2024 1:15 PM

## 2024-06-02 NOTE — ED Notes (Signed)
 Patient with bandage over sacrum.She states that the wound underneath is managed by home health.

## 2024-06-02 NOTE — ED Notes (Signed)
 Blood bank has blood ready for this patient.  Notified Briana,RN and she acknowledged.

## 2024-06-02 NOTE — Consult Note (Addendum)
 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  Full thickness L lateral foot dark dry tissue  2.  Deep Tissue Pressure Injury R  ankle  purple maroon discoloration evolving with some sloughing skin  3.  Stage 4 Pressure Injury sacrum that tunnels to R buttock red moist appears clean  4.  Intertriginous dermatitis R pannus  ICD-10 CM Codes for Irritant Dermatitis  L30.4  - Erythema intertrigo. Also used for abrasion of the hand, chafing of the skin, dermatitis due to sweating and friction, friction dermatitis, friction eczema, and genital/thigh intertrigo.  Pressure Injury POA: Yes Measurement:see nursing flowsheet  Wound bed: as above  Drainage (amount, consistency, odor) see nursing flowsheet  Periwound: mild erythema surrounding sacral wound  Dressing procedure/placement/frequency:  Cleanse sacral and R buttock wound with Vashe wound cleanser, using a Q tip applicator fill wound bed with Vashe moistened Kerlix, cover with dry gauze and silicone foam or ABD pad whichever is preferred.  Cleanse L lateral foot and R ankle wounds with Vashe, cover with Xeroform gauze Soila [phone]) and secure with silicone foam or Kerlix roll gauze.  Change daily. Would place B feet in Prevalon boots to offload pressure.  Cleanse under pannus with soap and water , dry and apply Interdry [employer] # [phone] Measure and cut length of InterDry to fit in skin folds that have skin breakdown Tuck InterDry fabric into skin folds in a single layer, allow for 2 inches of overhang from skin edges to allow for wicking to occur May remove to bathe; dry area thoroughly and then tuck into affected areas again Do not apply any creams or ointments when  using InterDry DO NOT THROW AWAY FOR 5 DAYS unless soiled with stool DO NOT [facility] product, this will inactivate the silver in the material  New sheet of Interdry should be applied after 5 days of use if patient continues to have skin breakdown   Patient should remain on a low air loss mattress throughout hospitalization d/t Stage 4 Sacral PI.  POC discussed with bedside nurse. WOC team will not follow. Re-consult if further needs arise.   Thank you,    Powell Bar MSN, RN-BC, [employer]

## 2024-06-02 NOTE — ED Notes (Signed)
Unable to obtain blood cultures  

## 2024-06-02 NOTE — H&P (Signed)
 History and Physical  [name] FMW:[id number] DOB: Dec 10, 1940 DOA: 06/02/2024  PCP: Chrystal Lamarr RAMAN, MD   Chief Complaint: Weakness, vomited up blood  HPI: [name] is a 83 y.o. 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  Patient states that she has been in her chronic stable condition, she had a follow-up with Dr. Lanny yesterday on routine labs was found to have a hemoglobin of 6.7.  Otherwise she was relatively asymptomatic, plan was for her to get outpatient blood transfusion today.  Instead she came to the ER because she had an episode this morning at 930 of coughing up about a half a cup of dark red blood, she also felt more weak and tired.  Workup in the emergency department as noted below shows evidence of significant anemia, patient has also been quite hypotensive.  At this time, she denies any nausea, abdominal pain, dizziness or lightheadedness.  On further discussion she also mentions that she has chronic swelling of her bilateral upper and lower extremities, but also has redness in the left upper extremity that has been there for about 3 weeks.  She denies any fevers or chills at home or any other concerns.  Review of Systems: Please see HPI for pertinent positives and negatives. A complete 10 system review of systems are otherwise negative.  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 There is a small calcified mass in the L:A near the LA appendage. No valvular masses seen with associated mitral annular calcification. LA Volume/ BSA27.4 ml/m2 No AS. Right ventricular systolic pressure is elevated at .   Jaundice    Myocardial infarction [facility])    June 2021   Neuromuscular disorder (HCC)    neuropathy in bilateral feet   Peripheral vascular disease (HCC)    Pneumonia    not hosp.    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  PTCAs with drug coated balloon Lt ant tibial artery and Lt popliteal artery  11/26/2019   Past Surgical History:  Procedure Laterality Date   ABDOMINAL AORTAGRAM  11/25/2019   ABDOMINAL AORTOGRAM W/LOWER EXTREMITY    ABDOMINAL AORTOGRAM N/A 06/15/2019   Procedure: ABDOMINAL AORTOGRAM;  Surgeon: Court Dorn PARAS, MD;  [facility];  Service: Cardiovascular;  Laterality: N/A;   ABDOMINAL AORTOGRAM W/LOWER EXTREMITY N/A 11/25/2019   Procedure: ABDOMINAL AORTOGRAM W/LOWER EXTREMITY;  Surgeon: Darron Deatrice LABOR, MD;  [facility]: [facility];  Service: Cardiovascular;  Laterality: N/A;  Lt leg   ABDOMINAL  AORTOGRAM W/LOWER EXTREMITY N/A 08/01/2020   Procedure: ABDOMINAL AORTOGRAM W/LOWER EXTREMITY;  Surgeon: Court Dorn PARAS, MD;  Location: [facility] INVASIVE CV LAB;  Service: Cardiovascular;  Laterality: N/A;   ABDOMINAL AORTOGRAM W/LOWER EXTREMITY N/A 04/28/2021   Procedure: ABDOMINAL AORTOGRAM W/LOWER EXTREMITY;  Surgeon: Harvey Carlin BRAVO, MD;  [facility];  Service: Cardiovascular;  Laterality: N/A;   ABDOMINAL AORTOGRAM W/LOWER EXTREMITY N/A 10/13/2021   Procedure: ABDOMINAL  AORTOGRAM W/LOWER EXTREMITY;  Surgeon: Magda Debby SAILOR, MD;  [facility];  Service: Cardiovascular;  Laterality: N/A;   ABDOMINAL AORTOGRAM W/LOWER EXTREMITY N/A 11/09/2022   Procedure: ABDOMINAL AORTOGRAM W/LOWER EXTREMITY;  Surgeon: Magda Debby SAILOR, MD;  [facility];  Service: Cardiovascular;  Laterality: N/A;   ABDOMINAL AORTOGRAM W/LOWER EXTREMITY N/A 11/16/2022   Procedure: ABDOMINAL AORTOGRAM W/LOWER EXTREMITY;  Surgeon: Magda Debby SAILOR, MD;  [facility];  Service: Cardiovascular;  Laterality: N/A;   AMPUTATION TOE Left 05/03/2021   Procedure: AMPUTATION OF THIRD LEFT  TOE;  Surgeon: Magda Debby SAILOR, MD;  Location: [facility] OR;  Service: Vascular;  Laterality: Left;   APPENDECTOMY     APPLICATION OF WOUND VAC Right 07/21/2019   Procedure: Application Of Prevena Wound Vac Right Groin;  Surgeon: Serene Gaile ORN, MD;  Location: [facility] OR;  Service: Vascular;  Laterality: Right;   BILIARY BRUSHING  04/07/2023   Procedure: BILIARY BRUSHING;  Surgeon: Rosalie Kitchens, MD;  Location: [facility] ENDOSCOPY;  Service: Gastroenterology;;   BILIARY STENT PLACEMENT  04/07/2023   Procedure: BILIARY STENT PLACEMENT;  Surgeon: Rosalie Kitchens, MD;  Location: [facility] ENDOSCOPY;  Service: Gastroenterology;;   CARPAL TUNNEL RELEASE Left    CARPAL TUNNEL RELEASE Right    CESAREAN SECTION     x 2   CHOLECYSTECTOMY N/A 10/12/2022   Procedure: LAPAROSCOPIC PARTIAL FENESTRATING CHOLECYSTECTOMY;  Surgeon: Dasie Leonor CROME, MD;  [facility] OR;  Service: General;  Laterality: N/A;   COLONOSCOPY     CORONARY ARTERY BYPASS GRAFT N/A 03/24/2020   Procedure: CORONARY ARTERY BYPASS GRAFTING (CABG) using LIMA to LAD (m); RIMA to RAMUS; Endoscopic Right Greater Saphenous Vein: SVG to Diag1; SVG to PLB (right); and SVG to PL (left).;  Surgeon: German Bartlett PEDLAR, MD;  [facility] OR;  Service: Open Heart Surgery;  Laterality: N/A;  BILATERAL IMA   ENDARTERECTOMY FEMORAL Right 07/21/2019   Procedure:  RIGHT ENDARTERECTOMY FEMORAL WITH PATCH ANGIOPLASTY;  Surgeon: Serene Gaile ORN, MD;  [facility];  Service: Vascular;  Laterality: Right;   ENDARTERECTOMY FEMORAL Right 08/16/2020   Procedure: RIGHT FEMORAL ENDARTERECTOMY;  Surgeon: Sheree Penne Bruckner, MD;  Location: [facility] OR;  Service: Vascular;  Laterality: Right;   ENDOVEIN HARVEST OF GREATER SAPHENOUS VEIN Right 03/24/2020   Procedure: ENDOVEIN HARVEST OF GREATER SAPHENOUS VEIN;  Surgeon: German Bartlett PEDLAR, MD;  [facility];  Service: Open Heart Surgery;  Laterality: Right;   ERCP N/A 04/07/2023   Procedure: ENDOSCOPIC RETROGRADE CHOLANGIOPANCREATOGRAPHY (ERCP);  Surgeon: Rosalie Kitchens, MD;  Location: [facility] ENDOSCOPY;  Service: Gastroenterology;  Laterality: N/A;   EYE SURGERY     cataract removal bilaterally   FEMORAL-POPLITEAL BYPASS GRAFT Right 08/16/2020   Procedure: BYPASS GRAFT FEMORAL-POPLITEAL ARTERY;  Surgeon: Sheree Penne Bruckner, MD;  Location: [facility] OR;  Service: Vascular;  Laterality: Right;   FEMORAL-POPLITEAL BYPASS GRAFT Left 05/03/2021   Procedure: LEFT FEMORAL TO BELOW KNEE POPLITEAL ARTERY BYPASS GRAFTING WITH 6MMX80 PTFE GRAFT;  Surgeon: Magda Debby SAILOR, MD;  [facility];  Service:  Vascular;  Laterality: Left;   LEFT HEART CATH AND CORONARY ANGIOGRAPHY N/A 03/22/2020   Procedure: LEFT HEART CATH AND CORONARY ANGIOGRAPHY;  Surgeon: [place], Peter M, MD;  Location: [facility] INVASIVE CV LAB;  Service: Cardiovascular;  Laterality: N/A;   LOWER EXTREMITY ANGIOGRAPHY Bilateral 06/15/2019   Procedure: Lower Extremity Angiography;  Surgeon: Court Dorn PARAS, MD;  Location: [facility] INVASIVE CV LAB;  Service: Cardiovascular;  Laterality: Bilateral;   LOWER EXTREMITY ANGIOGRAPHY Right 08/03/2019   Procedure: LOWER EXTREMITY ANGIOGRAPHY;  Surgeon: Court Dorn PARAS, MD;  [facility];  Service: Cardiovascular;  Laterality: Right;   LOWER EXTREMITY ANGIOGRAPHY N/A 09/07/2021   Procedure: LOWER EXTREMITY ANGIOGRAPHY;   Surgeon: Gretta Lonni PARAS, MD;  [facility];  Service: Cardiovascular;  Laterality: N/A;   PATCH ANGIOPLASTY Right 07/21/2019   Procedure: Patch Angioplasty Right Femoral Artery;  Surgeon: Serene Gaile ORN, MD;  Location: [facility] OR;  Service: Vascular;  Laterality: Right;   PERIPHERAL INTRAVASCULAR LITHOTRIPSY  11/25/2019   Procedure: INTRAVASCULAR LITHOTRIPSY;  Surgeon: Darron Deatrice LABOR, MD;  [facility];  Service: Cardiovascular;;  LT. SFA   PERIPHERAL VASCULAR ATHERECTOMY  08/03/2019   Procedure: PERIPHERAL VASCULAR ATHERECTOMY;  Surgeon: Court Dorn PARAS, MD;  Location: [facility] INVASIVE CV LAB;  Service: Cardiovascular;;  right SFA, right TP trunk   PERIPHERAL VASCULAR ATHERECTOMY  11/25/2019   Procedure: PERIPHERAL VASCULAR ATHERECTOMY;  Surgeon: Darron Deatrice LABOR, MD;  [facility];  Service: Cardiovascular;;  Lt.  POPLITEAL and AT   PERIPHERAL VASCULAR BALLOON ANGIOPLASTY Left 06/15/2019   Procedure: PERIPHERAL VASCULAR BALLOON ANGIOPLASTY;  Surgeon: Court Dorn PARAS, MD;  [facility];  Service: Cardiovascular;  Laterality: Left;  SFA UNSUCCESSFUL UNABLE TO CROSS LESION   PERIPHERAL VASCULAR BALLOON ANGIOPLASTY  08/03/2019   Procedure: PERIPHERAL VASCULAR BALLOON ANGIOPLASTY;  Surgeon: Court Dorn PARAS, MD;  [facility];  Service: Cardiovascular;;  right SFA, Right TP trunk   PERIPHERAL VASCULAR BALLOON ANGIOPLASTY  09/07/2021   Procedure: PERIPHERAL VASCULAR BALLOON ANGIOPLASTY;  Surgeon: Gretta Lonni PARAS, MD;  [facility];  Service: Cardiovascular;;   PERIPHERAL VASCULAR BALLOON ANGIOPLASTY Right 10/13/2021   Procedure: PERIPHERAL VASCULAR BALLOON ANGIOPLASTY;  Surgeon: Magda Debby SAILOR, MD;  [facility];  Service: Cardiovascular;  Laterality: Right;   PERIPHERAL VASCULAR BALLOON ANGIOPLASTY  11/09/2022   Procedure: PERIPHERAL VASCULAR BALLOON ANGIOPLASTY;  Surgeon:  Magda Debby SAILOR, MD;  [facility];  Service: Cardiovascular;;  fem-pop bypass and AT   PORTACATH PLACEMENT Left 04/29/2023   Procedure: INSERTION PORT-A-CATH;  Surgeon: Dasie Leonor CROME, MD;  Location: [facility] ORS;  Service: General;  Laterality: Left;   PORTACATH PLACEMENT N/A 07/12/2023   Procedure: REVISION PORT-A-CATH;  Surgeon: Dasie Leonor CROME, MD;  Location: [facility] OR;  Service: General;  Laterality: N/A;   REMOVAL OF STONES  04/07/2023   Procedure: REMOVAL OF STONES;  Surgeon: Rosalie Kitchens, MD;  Location: [facility] ENDOSCOPY;  Service: Gastroenterology;;   ANNETT  04/07/2023   Procedure: ANNETT;  Surgeon: Rosalie Kitchens, MD;  Location: [facility] ENDOSCOPY;  Service: Gastroenterology;;   TEE WITHOUT CARDIOVERSION N/A 03/24/2020   Procedure: TRANSESOPHAGEAL ECHOCARDIOGRAM (TEE);  Surgeon: German Bartlett PEDLAR, MD;  Location: [facility] OR;  Service: Open Heart Surgery;  Laterality: N/A;   TONSILLECTOMY     and adenoidectomy   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/2019   Procedure: TRANSMETATARSAL AMPUTATION;  Surgeon: Gershon Donnice SAUNDERS, DPM;  Location: [facility] OR;  Service: Podiatry;  Laterality: Right;   TRANSMETATARSAL AMPUTATION Left 05/11/2021   Procedure: LEFT TRANSMETATARSAL AMPUTATION;  Surgeon: Sheree Penne Bruckner, MD;  Location: [facility] OR;  Service: Vascular;  Laterality: Left;   TUBAL LIGATION     Social History:  reports that she quit smoking about 52 years ago. Her smoking use included cigarettes. She has never used smokeless tobacco. She reports that she does not drink alcohol  and does not use drugs.  Allergies  Allergen Reactions   Versed  [Midazolam ] Anxiety    Frantic, out of my mind, agitated    Bactrim  [Sulfamethoxazole -Trimethoprim ] Other (See Comments)    ^ K+( elevated)    Augmentin [Amoxicillin-Pot Clavulanate] Other (See Comments)    Elevates potassium level   Demerol [Meperidine]     Delusional    Meperidine Hcl     Other reaction(s): delusional   Scopolamine      Delusional   Patch *   Tramadol  Other (See Comments)    Keeps awake    Family History  Problem Relation Age of Onset   Cancer - Prostate Father    Cancer - Colon Father    Stroke Mother    Hypertension Mother    Hyperlipidemia Mother    Melanoma Brother      Prior to Admission medications   Medication Sig Start Date End Date Taking? Authorizing Provider  acetaminophen  (TYLENOL ) 500 MG tablet Take 1,000 mg by mouth every 6 (six) hours as needed for moderate pain (pain score 4-6), fever or mild pain (pain score 1-3).    [provider]  brimonidine  (ALPHAGAN ) 0.2 % ophthalmic solution Place 1 drop into the right eye 2 (two) times daily. 01/19/21   [provider]  ferrous sulfate  325 (65 FE) MG EC tablet Take 1 tablet (325 mg total) by mouth daily before breakfast. To be taken with a cup of orange juice on an otherwise empty stomach if possible 04/16/24 04/16/25  Shalhoub, Zachary PARAS, MD  furosemide  (LASIX ) 20 MG tablet Take 1 tablet (20 mg total) by mouth daily. 06/01/24   Lanny Callander, MD  hydrochlorothiazide  (HYDRODIURIL ) 12.5 MG tablet Take 12.5 mg by mouth daily.    [provider]  JARDIANCE  25 MG TABS tablet Take 25 mg by mouth daily. 04/20/24   [provider]  magnesium  oxide (MAG-OX) 400 (240 Mg) MG tablet Take 400 mg by mouth 2 (two) times daily.    [provider]  Metformin  HCl 500 MG/5ML SOLN Take 10 mLs by mouth in the morning and at bedtime. 04/20/24   [provider]  metoprolol  tartrate (LOPRESSOR ) 25 MG tablet Take 1 tablet (25 mg total) by mouth 2 (two) times daily. 02/18/24   Krishnan, Gokul, MD  mirtazapine  (REMERON ) 15 MG tablet Take 15 mg by mouth at bedtime.    [provider]  pantoprazole  (PROTONIX ) 40 MG tablet Take 1 tablet (40 mg total) by mouth 2 (two) times daily. 05/01/24 05/01/25  Adhikari, Amrit, MD  polyethylene glycol (MIRALAX  / GLYCOLAX ) 17 g packet Take 17 g by mouth daily. 02/19/24   Krishnan, Gokul, MD   Probiotic Product (PROBIOTIC PO) Take 1 capsule by mouth daily.    [provider]  Propylene Glycol (SYSTANE BALANCE) 0.6 % SOLN Place 1 drop into the left eye 3 (three) times daily as needed (dry/irritated eyes).    [provider]  rosuvastatin  (CRESTOR ) 40 MG tablet Take 40 mg by mouth at bedtime. 05/01/24   [provider]  telmisartan  (MICARDIS ) 40  MG tablet Take 40 mg by mouth daily. 03/27/24   [provider]  traZODone  (DESYREL ) 100 MG tablet Take 100 mg by mouth at bedtime. 03/12/24   [provider]  XARELTO  10 MG TABS tablet Take 1 tablet (10 mg total) by mouth daily. 06/01/24   Lanny Callander, MD    Physical Exam: BP (!) 93/41 (BP Location: Right Arm)   Pulse (!) 115   Temp (!) 97.4 F (36.3 C) (Rectal)   Resp 17   SpO2 100%  General:  Alert, oriented, calm, in no acute distress, she looks her stated age, she looks chronically quite ill.  She is a good historian, pleasant and cooperative. Cardiovascular: RRR, no murmurs or rubs, 2-3+ pitting edema in all extremities Respiratory: clear to auscultation bilaterally, no wheezes, no crackles  Abdomen: soft, nontender, nondistended, normal bowel tones heard  Skin: Her skin looks very thin and translucent, she has pitting edema in all extremities.  In her left upper extremity overlying the forearm and the back of her hand she has an area of confluent erythema without tenderness, fluctuance or warmth Musculoskeletal: no joint effusions, normal range of motion  Psychiatric: appropriate affect, normal speech  Neurologic: extraocular muscles intact, clear speech, moving all extremities with intact sensorium         Labs on Admission:  Basic Metabolic Panel: Recent Labs  Lab 06/01/24 1333 06/02/24 1029  NA 139 139  K 3.7 4.1  CL 106 103  CO2 23 11*  GLUCOSE 222* 183*  BUN 41* 41*  CREATININE 1.65* 1.76*  CALCIUM  7.7* 8.9   Liver Function Tests: Recent Labs  Lab 06/01/24 1333  06/02/24 1029  AST 18 36  ALT 14 19  ALKPHOS 162* 175*  BILITOT 0.4 0.3  PROT 3.8* 3.9*  ALBUMIN  1.9* 1.8*   Recent Labs  Lab 06/02/24 1029  LIPASE <10*   No results for input(s): AMMONIA in the last 168 hours. CBC: Recent Labs  Lab 06/01/24 1333 06/02/24 1029  WBC 12.9* 23.4*  NEUTROABS 11.3* 20.7*  HGB 6.7* 5.7*  HCT 20.4* 19.1*  MCV 95.8 103.8*  PLT 237 260   Cardiac Enzymes: No results for input(s): CKTOTAL, CKMB, CKMBINDEX, TROPONINI in the last 168 hours. BNP (last 3 results) No results for input(s): BNP in the last 8760 hours.  ProBNP (last 3 results) No results for input(s): PROBNP in the last 8760 hours.  CBG: Recent Labs  Lab 06/02/24 1022  GLUCAP 143*    Radiological Exams on Admission: CT CHEST ABDOMEN PELVIS WO CONTRAST Result Date: 06/02/2024 CLINICAL DATA:  known biliary cancer. worsening leukocytosis. Eval for abscess. Stent in CBD. * Tracking Code: BO * EXAM: CT CHEST, ABDOMEN AND PELVIS WITHOUT CONTRAST TECHNIQUE: Multidetector CT imaging of the chest, abdomen and pelvis was performed following the standard protocol without IV contrast. RADIATION DOSE REDUCTION: This exam was performed according to the departmental dose-optimization program which includes automated exposure control, adjustment of the mA and/or kV according to patient size and/or use of iterative reconstruction technique. COMPARISON:  CT scan chest, abdomen and pelvis from 04/23/2024. FINDINGS: CT CHEST FINDINGS Cardiovascular: Normal cardiac size. No pericardial effusion. No aortic aneurysm. There are coronary artery calcifications, in keeping with coronary artery disease. There are also moderate to severe peripheral atherosclerotic vascular calcifications of thoracic aorta and its major branches. Mediastinum/Nodes: Visualized thyroid  gland appears grossly unremarkable. No solid / cystic mediastinal masses. There is fluid-filled and dilated esophagus, which is nonspecific but  most likely seen in the  settings of chronic gastroesophageal reflux disease versus esophageal dysmotility. No mediastinal or axillary lymphadenopathy by size criteria. Evaluation of bilateral hila is limited due to lack on intravenous contrast: however, no large hilar lymphadenopathy identified. Lungs/Pleura: The central tracheo-bronchial tree is patent. Since the prior study, there are new airspace opacities in the left lung lower lobe, highly concerning for bronchopneumonia. Follow-up to clearing is recommended. No pleural effusion or pneumothorax. No lung mass or collapse. There are multiple scattered sub 4 mm noncalcified nodules in the right lung (marked with electronic arrow sign on series 100), which are unchanged since the prior study. No new suspicious lung nodule seen. Musculoskeletal: The visualized soft tissues of the chest wall are grossly unremarkable. No suspicious osseous lesions. There are mild to moderate multilevel degenerative changes in the visualized spine. CT ABDOMEN PELVIS FINDINGS Hepatobiliary: The liver is normal in size. Non-cirrhotic configuration. Previously seen hypoattenuating lesion in the segment 4A is not well seen on today's exam due to lack of intravenous contrast. There is an irregular hypoattenuating lesion in the right hepatic lobe, segment 4B (series 2, image 54), which appears decreased in size since the prior study. No other discrete liver lesion seen within the limitations of this unenhanced exam. Contrast-enhanced MRI or CT scan exam is recommended for better evaluation of liver lesions. No intrahepatic bile duct dilation. Left hepatic lobe pneumobilia noted. There is stable positioning of the biliary stent. Gallbladder is surgically absent. Pancreas: Unremarkable. No pancreatic ductal dilatation or surrounding inflammatory changes. Spleen: Within normal limits. No focal lesion. Adrenals/Urinary Tract: Adrenal glands are unremarkable. No suspicious renal mass within the  limitations of this unenhanced exam. No nephroureterolithiasis or obstructive uropathy. Bilateral extensive renal vascular calcifications noted. Unremarkable urinary bladder. Stomach/Bowel: No disproportionate dilation of the small or large bowel loops. No evidence of abnormal bowel wall thickening or inflammatory changes. The appendix was not visualized; however there is no acute inflammatory process in the right lower quadrant. Vascular/Lymphatic: There is mild ascites in the dependent pelvis. No walled-off abscess. No pneumoperitoneum. No abdominal or pelvic lymphadenopathy, by size criteria. No aneurysmal dilation of the major abdominal arteries. There are marked peripheral atherosclerotic vascular calcifications of the aorta and its major branches. Reproductive: The uterus is unremarkable. No large adnexal mass. Other: There is decubitus ulcer overlying the midline/right paramedian gluteal region. Underlying coccyx appears intact. No focal bone erosions. There is moderate anasarca. Musculoskeletal: No suspicious osseous lesions. There are mild - moderate multilevel degenerative changes in the visualized spine. IMPRESSION: 1. There are new airspace opacities in the left lung lower lobe, compatible with pneumonia. Follow-up to clearing is recommended. 2. There is a decubitus ulcer overlying the midline/right paramedian gluteal region. Underlying coccyx appears intact. No focal bone erosions. 3. Mild ascites. No walled-off abscess. No walled-off abscess or pneumoperitoneum. 4. Multiple other nonacute observations, as described above. Aortic Atherosclerosis (ICD10-I70.0). Electronically Signed   By: Ree Molt M.D.   On: 06/02/2024 14:43   DG Chest Portable 1 View Result Date: 06/02/2024 CLINICAL DATA:  Infectious evaluation. EXAM: PORTABLE CHEST 1 VIEW COMPARISON:  05/12/2024. FINDINGS: The heart size and mediastinal contours are unchanged. Prior median sternotomy and CABG. Left chest Port-A-Cath is  unchanged. No focal consolidation, pleural effusion, or pneumothorax. No acute osseous abnormality. IMPRESSION: No acute cardiopulmonary findings. Electronically Signed   By: Harrietta Sherry M.D.   On: 06/02/2024 11:42   Assessment/Plan [name] is a 83 y.o. 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  She also had an episode of small-volume hematemesis x 1 this morning.  She has been quite hypotensive in the ER, but currently blood pressure is improving after receiving 3 units PRBC. -Inpatient admission -Monitor closely on stepdown unit -She is now status post 3 unit PRBC -Check posttransfusion hemoglobin now -Clear liquid diet -IV PPI twice daily -Has been seen by GI Dr. Saintclair in the ER -N.p.o. after midnight for EGD in a.m.  Severe sepsis-meeting criteria with hypothermia, leukocytosis, severe hypotension.  Endorgan dysfunction with acute kidney injury.  Suspect left upper extremity cellulitis, sepsis physiology is likely also exacerbated by her severe symptomatic anemia being treated as above. -Check stat lactic acid -Empiric IV Zosyn   Acute kidney injury superimposed on CKD stage III-baseline creatinine approximately 1.2, currently elevated possibly due to sepsis. -Hold nephrotoxins, treat infection, avoid hypotension -Will hydrate gently with normal saline overnight, with caution to avoid worsening her peripheral edema  DM 2-non-insulin -dependent -Hold home Jardiance  and metformin  -Moderate dose sliding scale  Elevated INR-due to Xarelto , last taken 9/8 -Hold Xarelto  -Hold all blood thinners -Given 1 unit FFP in the ER -Follow-up blood cultures  Essential  hypertension-hold all antihypertensives  Hyperlipidemia-hold home statin  DVT prophylaxis: SCDs only    Code Status: Full Code  Consults called: GI  Admission status: The appropriate patient status for this patient is INPATIENT. Inpatient status is judged to be reasonable and necessary in order to provide the required intensity of service to ensure the patient's safety. The patient's presenting symptoms, physical exam findings, and initial radiographic and laboratory data in the context of their chronic comorbidities is felt to place them at high risk for further clinical deterioration. Furthermore, it is not anticipated that the patient will be medically stable for discharge from the hospital within 2 midnights of admission.    I certify that at the point of admission it is my clinical judgment that the patient will require inpatient hospital care spanning beyond 2 midnights from the point of admission due to high intensity of service, high risk for further deterioration and high frequency of surveillance required  Due to a high probability of clinically significant, life threatening deterioration, the patient required my highest level of preparedness to intervene emergently and I personally spent this critical care time directly and personally managing the patient. This critical care time included obtaining a history; examining the patient; reviewing vitals; ordering and review of studies; arranging urgent treatment with development of a management plan; evaluation of patient's response to treatment; frequent reassessment; and, discussions with other providers as well as available family.  Total critical care time: Approximately 65 minutes Sandra Brents [name] Gail MD [facility] Pager [phone]  If 7PM-7AM, please contact night-coverage [email or web address] Password [email or web address]  06/02/2024, 3:37 PM

## 2024-06-02 NOTE — ED Notes (Signed)
 Unable to get additional labs after multiple attempts, provider aware.

## 2024-06-02 NOTE — ED Provider Notes (Signed)
 [place] EMERGENCY DEPARTMENT AT [facility] Provider Note   CSN: [id number] Arrival date & time: 06/02/24  1004     Patient presents with: No chief complaint on file.   [name] is a 83 y.o. female.   HPI   Patient presents because of low blood pressure.  She had went to clinic today to receive packed red blood cells because of anemia.  Was found to have low blood pressure.  Patient also had episode of hematemesis where she actually threw up blood today.  First time this is happening.  Patient states that she still having bowel movements.  No fever no chills.  No ongoing abdominal pain this point in time.  Otherwise, she states that she feels baseline.  No recent sick contacts.  No chest pain or shortness of breath.  Does feel fatigued.     Previous medical history reviewed : Patient last admitted and discharged on May 23, 2024.  Was admitted because of symptomatic anemia.  Currently on Xarelto .  Cholangiocarcinoma.  History of hemorrhage as well.  Received 2 units of packed red blood cells at that time.  Had follow-up with oncologist yesterday.  Anemia with hemoglobin 6.6.  Had ordered 2 units of blood for today.  Did have borderline hypotension during that time.  Recommend holding Lasix  for a week.   Prior to Admission medications   Medication Sig Start Date End Date Taking? Authorizing Provider  acetaminophen  (TYLENOL ) 500 MG tablet Take 1,000 mg by mouth every 6 (six) hours as needed for moderate pain (pain score 4-6), fever or mild pain (pain score 1-3).    [provider]  brimonidine  (ALPHAGAN ) 0.2 % ophthalmic solution Place 1 drop into the right eye 2 (two) times daily. 01/19/21   [provider]  ferrous sulfate  325 (65 FE) MG EC tablet Take 1 tablet (325 mg total) by mouth daily before breakfast. To be taken with a cup of orange juice on an otherwise empty stomach if possible 04/16/24 04/16/25  Shalhoub, Zachary PARAS, MD  furosemide  (LASIX ) 20  MG tablet Take 1 tablet (20 mg total) by mouth daily. 06/01/24   Lanny Callander, MD  hydrochlorothiazide  (HYDRODIURIL ) 12.5 MG tablet Take 12.5 mg by mouth daily.    [provider]  JARDIANCE  25 MG TABS tablet Take 25 mg by mouth daily. 04/20/24   [provider]  magnesium  oxide (MAG-OX) 400 (240 Mg) MG tablet Take 400 mg by mouth 2 (two) times daily.    [provider]  Metformin  HCl 500 MG/5ML SOLN Take 10 mLs by mouth in the morning and at bedtime. 04/20/24   [provider]  metoprolol  tartrate (LOPRESSOR ) 25 MG tablet Take 1 tablet (25 mg total) by mouth 2 (two) times daily. 02/18/24   Krishnan, Gokul, MD  mirtazapine  (REMERON ) 15 MG tablet Take 15 mg by mouth at bedtime.    [provider]  pantoprazole  (PROTONIX ) 40 MG tablet Take 1 tablet (40 mg total) by mouth 2 (two) times daily. 05/01/24 05/01/25  Adhikari, Amrit, MD  polyethylene glycol (MIRALAX  / GLYCOLAX ) 17 g packet Take 17 g by mouth daily. 02/19/24   Krishnan, Gokul, MD  Probiotic Product (PROBIOTIC PO) Take 1 capsule by mouth daily.    [provider]  Propylene Glycol (SYSTANE BALANCE) 0.6 % SOLN Place 1 drop into the left eye 3 (three) times daily as needed (dry/irritated eyes).    [provider]  rosuvastatin  (CRESTOR ) 40 MG tablet Take 40 mg  by mouth at bedtime. 05/01/24   [provider]  telmisartan  (MICARDIS ) 40 MG tablet Take 40 mg by mouth daily. 03/27/24   [provider]  traZODone  (DESYREL ) 100 MG tablet Take 100 mg by mouth at bedtime. 03/12/24   [provider]  XARELTO  10 MG TABS tablet Take 1 tablet (10 mg total) by mouth daily. 06/01/24   Lanny Callander, MD    Allergies: Versed  [midazolam ], Bactrim  [sulfamethoxazole -trimethoprim ], Augmentin [amoxicillin-pot clavulanate], Demerol [meperidine], Meperidine hcl, Scopolamine, and Tramadol     Review of Systems  Constitutional:  Negative for chills and fever.  HENT:  Negative for ear pain and sore  throat.   Eyes:  Negative for pain and visual disturbance.  Respiratory:  Negative for cough and shortness of breath.   Cardiovascular:  Negative for chest pain and palpitations.  Gastrointestinal:  Negative for abdominal pain and vomiting.  Genitourinary:  Negative for dysuria and hematuria.  Musculoskeletal:  Negative for arthralgias and back pain.  Skin:  Negative for color change and rash.  Neurological:  Negative for seizures and syncope.  All other systems reviewed and are negative.   Updated Vital Signs BP (!) 104/38   Pulse (!) 116   Temp 97.7 F (36.5 C)   Resp 10   SpO2 100%   Physical Exam Vitals and nursing note reviewed.  Constitutional:      General: She is not in acute distress.    Appearance: She is well-developed.  HENT:     Head: Normocephalic and atraumatic.  Eyes:     Conjunctiva/sclera: Conjunctivae normal.  Cardiovascular:     Rate and Rhythm: Normal rate and regular rhythm.     Heart sounds: No murmur heard. Pulmonary:     Effort: Pulmonary effort is normal. No respiratory distress.     Breath sounds: Normal breath sounds.  Abdominal:     Palpations: Abdomen is soft.     Tenderness: There is no abdominal tenderness.  Musculoskeletal:        General: No swelling.       Arms:     Cervical back: Neck supple.  Skin:    General: Skin is warm and dry.     Capillary Refill: Capillary refill takes less than 2 seconds.  Neurological:     Mental Status: She is alert.  Psychiatric:        Mood and Affect: Mood normal.     (all labs ordered are listed, but only abnormal results are displayed) Labs Reviewed  CBC WITH DIFFERENTIAL/PLATELET - Abnormal; Notable for the following components:      Result Value   WBC 23.4 (*)    RBC 1.84 (*)    Hemoglobin 5.7 (*)    HCT 19.1 (*)    MCV 103.8 (*)    MCHC 29.8 (*)    RDW 21.2 (*)    Neutro Abs 20.7 (*)    Monocytes Absolute 1.6 (*)    Abs Immature Granulocytes 0.43 (*)    All other components  within normal limits  COMPREHENSIVE METABOLIC PANEL WITH GFR - Abnormal; Notable for the following components:   CO2 11 (*)    Glucose, Bld 183 (*)    BUN 41 (*)    Creatinine, Ser 1.76 (*)    Total Protein 3.9 (*)    Albumin  1.8 (*)    Alkaline Phosphatase 175 (*)    GFR, Estimated 28 (*)    Anion gap 25 (*)    All other components within normal limits  LIPASE, BLOOD - Abnormal; Notable for the following components:   Lipase <10 (*)    All other components within normal limits  PROTIME-INR - Abnormal; Notable for the following components:   Prothrombin  Time 62.1 (*)    INR 6.9 (*)    All other components within normal limits  APTT - Abnormal; Notable for the following components:   aPTT 51 (*)    All other components within normal limits  CBG MONITORING, ED - Abnormal; Notable for the following components:   Glucose-Capillary 143 (*)    All other components within normal limits  CULTURE, BLOOD (ROUTINE X 2)  CULTURE, BLOOD (ROUTINE X 2)  MRSA NEXT GEN BY PCR, NASAL  URINALYSIS, ROUTINE W REFLEX MICROSCOPIC  LACTIC ACID, PLASMA  LACTIC ACID, PLASMA  HEMOGLOBIN AND HEMATOCRIT, BLOOD  BASIC METABOLIC PANEL WITH GFR  CBC  TYPE AND SCREEN  PREPARE RBC (CROSSMATCH)  PREPARE RBC (CROSSMATCH)  PREPARE FRESH FROZEN PLASMA    EKG: EKG Interpretation Date/Time:  Tuesday June 02 2024 10:54:55 EDT Ventricular Rate:  105 PR Interval:  162 QRS Duration:  104 QT Interval:  387 QTC Calculation: 512 R Axis:   -78  Text Interpretation: Sinus tachycardia Multiform ventricular premature complexes RVH with secondary repolarization abnrm Inferior infarct, old Anterolateral infarct, age indeterminate Prolonged QT interval Confirmed by Simon Rea [phone]) on 06/02/2024 1:42:40 PM  Radiology: CT CHEST ABDOMEN PELVIS WO CONTRAST Result Date: 06/02/2024 CLINICAL DATA:  known biliary cancer. worsening leukocytosis. Eval for abscess. Stent in CBD. * Tracking Code: BO * EXAM: CT CHEST,  ABDOMEN AND PELVIS WITHOUT CONTRAST TECHNIQUE: Multidetector CT imaging of the chest, abdomen and pelvis was performed following the standard protocol without IV contrast. RADIATION DOSE REDUCTION: This exam was performed according to the departmental dose-optimization program which includes automated exposure control, adjustment of the mA and/or kV according to patient size and/or use of iterative reconstruction technique. COMPARISON:  CT scan chest, abdomen and pelvis from 04/23/2024. FINDINGS: CT CHEST FINDINGS Cardiovascular: Normal cardiac size. No pericardial effusion. No aortic aneurysm. There are coronary artery calcifications, in keeping with coronary artery disease. There are also moderate to severe peripheral atherosclerotic vascular calcifications of thoracic aorta and its major branches. Mediastinum/Nodes: Visualized thyroid  gland appears grossly unremarkable. No solid / cystic mediastinal masses. There is fluid-filled and dilated esophagus, which is nonspecific but most likely seen in the settings of chronic gastroesophageal reflux disease versus esophageal dysmotility. No mediastinal or axillary lymphadenopathy by size criteria. Evaluation of bilateral hila is limited due to lack on intravenous contrast: however, no large hilar lymphadenopathy identified. Lungs/Pleura: The central tracheo-bronchial tree is patent. Since the prior study, there are new airspace opacities in the left lung lower lobe, highly concerning for bronchopneumonia. Follow-up to clearing is recommended. No pleural effusion or pneumothorax. No lung mass or collapse. There are multiple scattered sub 4 mm noncalcified nodules in the right lung (marked with electronic arrow sign on series 100), which are unchanged since the prior study. No new suspicious lung nodule seen. Musculoskeletal: The visualized soft tissues of the chest wall are grossly unremarkable. No suspicious osseous lesions. There are mild to moderate multilevel  degenerative changes in the visualized spine. CT ABDOMEN PELVIS FINDINGS Hepatobiliary: The liver is normal in size. Non-cirrhotic configuration. Previously seen hypoattenuating lesion in the segment 4A is not well seen on today's exam due to lack of intravenous contrast. There is an irregular hypoattenuating lesion in the right hepatic lobe, segment 4B (series 2, image 54), which appears decreased  in size since the prior study. No other discrete liver lesion seen within the limitations of this unenhanced exam. Contrast-enhanced MRI or CT scan exam is recommended for better evaluation of liver lesions. No intrahepatic bile duct dilation. Left hepatic lobe pneumobilia noted. There is stable positioning of the biliary stent. Gallbladder is surgically absent. Pancreas: Unremarkable. No pancreatic ductal dilatation or surrounding inflammatory changes. Spleen: Within normal limits. No focal lesion. Adrenals/Urinary Tract: Adrenal glands are unremarkable. No suspicious renal mass within the limitations of this unenhanced exam. No nephroureterolithiasis or obstructive uropathy. Bilateral extensive renal vascular calcifications noted. Unremarkable urinary bladder. Stomach/Bowel: No disproportionate dilation of the small or large bowel loops. No evidence of abnormal bowel wall thickening or inflammatory changes. The appendix was not visualized; however there is no acute inflammatory process in the right lower quadrant. Vascular/Lymphatic: There is mild ascites in the dependent pelvis. No walled-off abscess. No pneumoperitoneum. No abdominal or pelvic lymphadenopathy, by size criteria. No aneurysmal dilation of the major abdominal arteries. There are marked peripheral atherosclerotic vascular calcifications of the aorta and its major branches. Reproductive: The uterus is unremarkable. No large adnexal mass. Other: There is decubitus ulcer overlying the midline/right paramedian gluteal region. Underlying coccyx appears intact.  No focal bone erosions. There is moderate anasarca. Musculoskeletal: No suspicious osseous lesions. There are mild - moderate multilevel degenerative changes in the visualized spine. IMPRESSION: 1. There are new airspace opacities in the left lung lower lobe, compatible with pneumonia. Follow-up to clearing is recommended. 2. There is a decubitus ulcer overlying the midline/right paramedian gluteal region. Underlying coccyx appears intact. No focal bone erosions. 3. Mild ascites. No walled-off abscess. No walled-off abscess or pneumoperitoneum. 4. Multiple other nonacute observations, as described above. Aortic Atherosclerosis (ICD10-I70.0). Electronically Signed   By: Ree Molt M.D.   On: 06/02/2024 14:43   DG Chest Portable 1 View Result Date: 06/02/2024 CLINICAL DATA:  Infectious evaluation. EXAM: PORTABLE CHEST 1 VIEW COMPARISON:  05/12/2024. FINDINGS: The heart size and mediastinal contours are unchanged. Prior median sternotomy and CABG. Left chest Port-A-Cath is unchanged. No focal consolidation, pleural effusion, or pneumothorax. No acute osseous abnormality. IMPRESSION: No acute cardiopulmonary findings. Electronically Signed   By: Harrietta Sherry M.D.   On: 06/02/2024 11:42     Procedures   Medications Ordered in the ED  0.9 %  sodium chloride  infusion (Manually program via Guardrails IV Fluids) ( Intravenous Not Given 06/02/24 1250)  0.9 %  sodium chloride  infusion (Manually program via Guardrails IV Fluids) ( Intravenous Not Given 06/02/24 1344)  0.9 %  sodium chloride  infusion (Manually program via Guardrails IV Fluids) ( Intravenous Not Given 06/02/24 1344)  pantoprazole  (PROTONIX ) injection 40 mg (has no administration in time range)  acetaminophen  (TYLENOL ) tablet 650 mg (has no administration in time range)    Or  acetaminophen  (TYLENOL ) suppository 650 mg (has no administration in time range)  traZODone  (DESYREL ) tablet 25 mg (has no administration in time range)  ondansetron   (ZOFRAN ) tablet 4 mg (has no administration in time range)    Or  ondansetron  (ZOFRAN ) injection 4 mg (has no administration in time range)  albuterol  (PROVENTIL ) (2.5 MG/3ML) 0.083% nebulizer solution 2.5 mg (has no administration in time range)  insulin  aspart (novoLOG ) injection 0-15 Units (has no administration in time range)  piperacillin -tazobactam (ZOSYN ) IVPB 3.375 g (has no administration in time range)  0.9 %  sodium chloride  infusion (0 mLs Intravenous Hold 06/02/24 1658)  Chlorhexidine  Gluconate Cloth 2 % PADS 6 each (6 each Topical  Given 06/02/24 1659)  Oral care mouth rinse (has no administration in time range)  pantoprazole  (PROTONIX ) injection 40 mg (40 mg Intravenous Given 06/02/24 1057)  cefTRIAXone  (ROCEPHIN ) 2 g in sodium chloride  0.9 % 100 mL IVPB (0 g Intravenous Stopped 06/02/24 1315)  metroNIDAZOLE  (FLAGYL ) IVPB 500 mg (0 mg Intravenous Stopped 06/02/24 1340)  0.9 %  sodium chloride  infusion (10 mL/hr Intravenous New Bag/Given 06/02/24 1630)    Clinical Course as of 06/02/24 1701  Tue Jun 02, 2024  1335 Will need an endoscopy  [TL]    Clinical Course User Index [TL] Simon Lavonia SAILOR, MD                                 Medical Decision Making Amount and/or Complexity of Data Reviewed Labs: ordered. Radiology: ordered.  Risk Prescription drug management. Decision regarding hospitalization.    Patient presents because of low blood pressure.  She had went to clinic today to receive packed red blood cells because of anemia.  Was found to have low blood pressure.  Patient also had episode of hematemesis where she actually threw up blood today.  First time this is happening.  Patient states that she still having bowel movements.  No fever no chills.  No ongoing abdominal pain this point in time.  Otherwise, she states that she feels baseline.  No recent sick contacts.  No chest pain or shortness of breath.  Does feel fatigued.     Previous medical history reviewed : Patient  last admitted and discharged on May 23, 2024.  Was admitted because of symptomatic anemia.  Currently on Xarelto .  Cholangiocarcinoma.  History of hemorrhage as well.  Received 2 units of packed red blood cells at that time.  Had follow-up with oncologist yesterday.  Anemia with hemoglobin 6.6.  Had ordered 2 units of blood for today.  Did have borderline hypotension during that time.  Recommend holding Lasix  for a week.   On exam, heart rate around 100.  Hypotensive.  Systolic of 78.  Mentating well.  ANO x 3 GCS of 15.  Benign abdomen is point in time.   Patient arrived with 2 units of packed red blood cells already typed and screened for her.  Patient consented to transfusion of blood at this point in time.  Ordered 2 units of packed red blood cells to be transfused.  Will hold off any kind of aggressive normal saline or LR bolus in the setting of patient's significant extremity edema.. Will see how she responds to the 2 u prbc    Obtain laboratory workup.  Patient has anemia of 5.7.  This is compared with hemoglobin of 6.7 yesterday.  Patient also has elevated leukocytosis and left shift.  Platelets normal.  Patient also has acidosis based off bicarb of 11.  Increased anion gap as well.  Given all this, I did start the patient on broad-spectrum antibiotics.  Wanted to include Zosyn  but patient has not penicillin allergy.  Therefore, started patient on ceftriaxone  and Flagyl .  Did expand to include vancomycin  in the setting of her being hypotensive, hypothermic.  Granted, hypotensive and hypothermia is likely because of anemia but did cover with vancomycin  given she has a pretty large sacral wound.  Will need to cover for skin flora etiology.    Initially had only ordered 2 units of packed red blood cells.  Given the patient blood pressure was still labile, did order  a third unit  No active bleed here in the ED.  No hematemesis here but she did report episode of hematemesis today.  Therefore, I  did give 1 dose of PPI.  Also consulted gastroenterology who saw patient at bedside.  May need endoscopy given low hemoglobin as well as episode of hematemesis.  Ordered INR.  This did not come back for some time.  Elevated to above 6.  Patient not on warfarin.  Is on Eliquis in the setting of A-fib.  Question whether or not this could be related to possible worsening metastatic disease process in the liver.  Did order 1 unit of FFP to help correct this and help with volume expansion as well.  CT scan of the chest/abd shows possible airspace disease in the left lower lung.  Decubitus ulcer in the gluteal region that was visualized on physical exam.  Otherwise, no abscess or other pathology in the abdomen.  CRITICAL CARE Performed by: Lavonia LOISE Pat   Total critical care time: 45 minutes  Critical care time was exclusive of separately billable procedures and treating other patients.  Critical care was necessary to treat or prevent imminent or life-threatening deterioration.  Critical care was time spent personally by me on the following activities: development of treatment plan with patient and/or surrogate as well as nursing, discussions with consultants, evaluation of patient's response to treatment, examination of patient, obtaining history from patient or surrogate, ordering and performing treatments and interventions, ordering and review of laboratory studies, ordering and review of radiographic studies, pulse oximetry and re-evaluation of patient's condition.     Final diagnoses:  Anemia, unspecified type  Pneumonia due to infectious organism, unspecified laterality, unspecified part of lung  Hypotension, unspecified hypotension type    ED Discharge Orders     None          Pat Lavonia LOISE, MD 06/02/24 1701

## 2024-06-02 NOTE — ED Notes (Signed)
 Simon, MD notified critical PT and INR values.

## 2024-06-03 ENCOUNTER — Other Ambulatory Visit: Payer: Self-pay

## 2024-06-03 DIAGNOSIS — C23 Malignant neoplasm of gallbladder: Secondary | ICD-10-CM | POA: Diagnosis not present

## 2024-06-03 DIAGNOSIS — N179 Acute kidney failure, unspecified: Secondary | ICD-10-CM

## 2024-06-03 DIAGNOSIS — I480 Paroxysmal atrial fibrillation: Secondary | ICD-10-CM

## 2024-06-03 DIAGNOSIS — D6869 Other thrombophilia: Secondary | ICD-10-CM | POA: Insufficient documentation

## 2024-06-03 DIAGNOSIS — D649 Anemia, unspecified: Secondary | ICD-10-CM | POA: Diagnosis not present

## 2024-06-03 DIAGNOSIS — L8994 Pressure ulcer of unspecified site, stage 4: Secondary | ICD-10-CM

## 2024-06-03 DIAGNOSIS — K920 Hematemesis: Secondary | ICD-10-CM | POA: Insufficient documentation

## 2024-06-03 LAB — BPAM RBC
Blood Product Expiration Date: 202510052359
Blood Product Unit Number: 202510052359
ISSUE DATE / TIME: 202509091229
PRODUCT CODE: 202509091005
PRODUCT CODE: 202510052359
Unit Type and Rh: 5100
Unit Type and Rh: 202510052359
Unit Type and Rh: 5100
Unit Type and Rh: 5100

## 2024-06-03 LAB — BASIC METABOLIC PANEL WITH GFR
Anion gap: 13 (ref 5–15)
Anion gap: 11 (ref 5–15)
BUN: 39 mg/dL — ABNORMAL HIGH (ref 8–23)
BUN: 40 mg/dL — ABNORMAL HIGH (ref 8–23)
CO2: 21 mmol/L — ABNORMAL LOW (ref 22–32)
CO2: 21 mmol/L — ABNORMAL LOW (ref 22–32)
Calcium: 8.5 mg/dL — ABNORMAL LOW (ref 8.9–10.3)
Calcium: 8.2 mg/dL — ABNORMAL LOW (ref 8.9–10.3)
Chloride: 108 mmol/L (ref 98–111)
Chloride: 110 mmol/L (ref 98–111)
Creatinine, Ser: 1.35 mg/dL — ABNORMAL HIGH (ref 0.44–1.00)
Creatinine, Ser: 1.24 mg/dL — ABNORMAL HIGH (ref 0.44–1.00)
GFR, Estimated: 39 mL/min — ABNORMAL LOW (ref >60)
GFR, Estimated: 43 mL/min — ABNORMAL LOW (ref >60)
Glucose, Bld: 122 mg/dL — ABNORMAL HIGH (ref 70–99)
Glucose, Bld: 98 mg/dL (ref 70–99)
Potassium: 3.6 mmol/L (ref 3.5–5.1)
Potassium: 3.4 mmol/L — ABNORMAL LOW (ref 3.5–5.1)
Sodium: 141 mmol/L (ref 135–145)
Sodium: 143 mmol/L (ref 135–145)

## 2024-06-03 LAB — PROTIME-INR
INR: 1.9 — ABNORMAL HIGH (ref 0.8–1.2)
INR: 1.8 — ABNORMAL HIGH (ref 0.8–1.2)
Prothrombin Time: 22.3 s — ABNORMAL HIGH (ref 11.4–15.2)
Prothrombin Time: 21.9 s — ABNORMAL HIGH (ref 11.4–15.2)

## 2024-06-03 LAB — CBC
HCT: 27.6 % — ABNORMAL LOW (ref 36.0–46.0)
HCT: 27.3 % — ABNORMAL LOW (ref 36.0–46.0)
HCT: 36.4 % (ref 36.0–46.0)
Hemoglobin: 9.1 g/dL — ABNORMAL LOW (ref 12.0–15.0)
Hemoglobin: 9 g/dL — ABNORMAL LOW (ref 12.0–15.0)
Hemoglobin: 11.9 g/dL — ABNORMAL LOW (ref 12.0–15.0)
MCH: 28.7 pg (ref 26.0–34.0)
MCH: 28.8 pg (ref 26.0–34.0)
MCH: 27.7 pg (ref 26.0–34.0)
MCHC: 33 g/dL (ref 30.0–36.0)
MCHC: 33 g/dL (ref 30.0–36.0)
MCHC: 32.7 g/dL (ref 30.0–36.0)
MCV: 87.1 fL (ref 80.0–100.0)
MCV: 87.5 fL (ref 80.0–100.0)
MCV: 84.8 fL (ref 80.0–100.0)
Platelets: 191 K/uL (ref 150–400)
Platelets: 189 K/uL (ref 150–400)
Platelets: 194 K/uL (ref 150–400)
RBC: 3.17 MIL/uL — ABNORMAL LOW (ref 3.87–5.11)
RBC: 3.12 MIL/uL — ABNORMAL LOW (ref 3.87–5.11)
RBC: 4.29 MIL/uL (ref 3.87–5.11)
RDW: 20.7 % — ABNORMAL HIGH (ref 11.5–15.5)
RDW: 21.1 % — ABNORMAL HIGH (ref 11.5–15.5)
RDW: 20.8 % — ABNORMAL HIGH (ref 11.5–15.5)
WBC: 14.9 K/uL — ABNORMAL HIGH (ref 4.0–10.5)
WBC: 13.3 K/uL — ABNORMAL HIGH (ref 4.0–10.5)
WBC: 13.1 K/uL — ABNORMAL HIGH (ref 4.0–10.5)
nRBC: 0.5 % — ABNORMAL HIGH (ref 0.0–0.2)
nRBC: 0.7 % — ABNORMAL HIGH (ref 0.0–0.2)
nRBC: 0.7 % — ABNORMAL HIGH (ref 0.0–0.2)

## 2024-06-03 LAB — TYPE AND SCREEN
ABO/RH(D): O POS
Antibody Screen: NEGATIVE
Unit division: 0
Unit division: 0

## 2024-06-03 LAB — PREPARE FRESH FROZEN PLASMA

## 2024-06-03 LAB — GLUCOSE, CAPILLARY
Glucose-Capillary: 119 mg/dL — ABNORMAL HIGH (ref 70–99)
Glucose-Capillary: 157 mg/dL — ABNORMAL HIGH (ref 70–99)
Glucose-Capillary: 186 mg/dL — ABNORMAL HIGH (ref 70–99)
Glucose-Capillary: 78 mg/dL (ref 70–99)
Glucose-Capillary: 87 mg/dL (ref 70–99)
Glucose-Capillary: 95 mg/dL (ref 70–99)

## 2024-06-03 LAB — BPAM FFP
Blood Product Expiration Date: 202509142359
Blood Product Expiration Date: 202509142359
ISSUE DATE / TIME: 202509091549
ISSUE DATE / TIME: 202509092045
Unit Type and Rh: 9500
Unit Type and Rh: 5100

## 2024-06-03 LAB — MAGNESIUM: Magnesium: 1.9 mg/dL (ref 1.7–2.4)

## 2024-06-03 LAB — CORTISOL-AM, BLOOD: Cortisol - AM: 34.9 ug/dL — ABNORMAL HIGH (ref 6.7–22.6)

## 2024-06-03 LAB — PHOSPHORUS: Phosphorus: 3 mg/dL (ref 2.5–4.6)

## 2024-06-03 MED ORDER — HYDROCORTISONE SOD SUC (PF) 100 MG IJ SOLR
INTRAMUSCULAR | Status: AC
  Filled 2024-06-03: qty 2

## 2024-06-03 MED ORDER — HYDROCORTISONE SOD SUC (PF) 100 MG IJ SOLR
100.0000 mg | Freq: Once | INTRAMUSCULAR | Status: AC
  Administered 2024-06-03: 100 mg via INTRAVENOUS
  Filled 2024-06-03: qty 2

## 2024-06-03 MED ORDER — POTASSIUM CHLORIDE 10 MEQ/100ML IV SOLN
10.0000 meq | INTRAVENOUS | Status: AC
  Administered 2024-06-03 (×4): 10 meq via INTRAVENOUS
  Filled 2024-06-03 (×4): qty 100

## 2024-06-03 MED ORDER — BRIMONIDINE TARTRATE 0.2 % OP SOLN
1.0000 [drp] | Freq: Two times a day (BID) | OPHTHALMIC | Status: DC
  Administered 2024-06-03 – 2024-06-09 (×13): 1 [drp] via OPHTHALMIC
  Filled 2024-06-03: qty 5

## 2024-06-03 MED ORDER — PANTOPRAZOLE SODIUM 40 MG IV SOLR
40.0000 mg | Freq: Four times a day (QID) | INTRAVENOUS | Status: DC
  Administered 2024-06-03 – 2024-06-04 (×4): 40 mg via INTRAVENOUS
  Filled 2024-06-03 (×4): qty 10

## 2024-06-03 NOTE — Plan of Care (Signed)
  Problem: Clinical Measurements: Goal: Ability to maintain clinical measurements within normal limits will improve Outcome: Progressing Goal: Will remain free from infection Outcome: Progressing Goal: Diagnostic test results will improve Outcome: Progressing Goal: Respiratory complications will improve Outcome: Progressing Goal: Cardiovascular complication will be avoided Outcome: Progressing   Problem: Elimination: Goal: Will not experience complications related to bowel motility Outcome: Progressing Goal: Will not experience complications related to urinary retention Outcome: Progressing   

## 2024-06-03 NOTE — Progress Notes (Addendum)
 Progress Note   Patient: [name] FMW:[id number] DOB: 1941/06/20 DOA: 06/02/2024     1 DOS: the patient was seen and examined on 06/03/2024   Brief hospital course: 83 year old woman PMH including gallbladder cancer, chronic anemia requiring multiple transfusions, presented with hemoglobin of 6.7, had 2 episodes of hematemesis.  Admitted for further evaluation.  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  Hemoglobin satisfactory improvement in response to blood products Seen by GI, plan for EGD.  Recently seen as consult on 05/23/2024 for symptomatic anemia which was thought to be multifactorial: Sacral ulcer with chronic oozing, on Xarelto , gallbladder cancer with metastases, chemotherapy, radiation, hemorrhoidal blood loss.  Trend hemoglobin  AKI CKD stage II CKD stage III ruled out Baseline creatinine around 1.0.  Last 4 months creatinine 1.65. Creatinine improved.  Monitor.  PAF Acquired thrombophilia Hold Xarelto  given above   DM 2-non-insulin -dependent Hold home Jardiance  and metformin  Moderate dose sliding scale   Elevated INR-due to Xarelto , last taken 9/8 Significance unclear, treated with FFP.  Monitor clinically.  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 Lanny   Essential hypertension Asymptomatic hypotension, diastolic No signs or symptoms of sepsis.  Check cortisol and give 1 dose of hydrocortisone  afterwards. 1 more unit of blood is currently in process.  Fluids as needed. Do not see indication for vasopressor at this point.   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  No evidence of ongoing infection.  Left upper extremity redness present for 3 weeks and not suggestive of infective cellulitis.  Multiple abnormalities probably related to anemia and other comorbidities. Stop Zosyn  and monitor. CT findings noted, however there are no signs or symptoms to suggest pneumonia.  This could have been aspiration.  Will monitor clinically.  No hypoxia noted.  Goals of care: Patient confirms full code at bedside. Fifth admission in last 4 months .mortalit     Subjective:  Feels ok, no SOB, no pain, has vomited blood x2 in hospital, none at home, no bleeding at home. LUE has been red for 3 weeks, no pain. Has had BUE/BLE edema for weeks. Has chronic bed sore.  Physical Exam: Vitals:   06/03/24 0822 06/03/24 0830 06/03/24 0900 06/03/24 0930  BP: (!) 93/22 (!) 95/26 (!) 112/31 (!) 106/28  Pulse: (!) 110 (!) 107 (!) 112 (!) 111  Resp: 10 14 10  (!) 9  Temp: 97.8 F (36.6 C)     TempSrc:      SpO2: 100% 100% 100% 100%  Height:       Physical Exam Vitals reviewed.  Constitutional:      General: She is not in acute distress.    Appearance: She is not ill-appearing or toxic-appearing.     Comments: Appears well, bed in Trendelenburg.   Cardiovascular:     Rate and Rhythm: Regular rhythm. Tachycardia present.     Heart sounds: No murmur heard. Pulmonary:     Effort: Pulmonary effort is normal. No respiratory distress.     Breath sounds: No wheezing, rhonchi or rales.  Musculoskeletal:     Right lower leg: Edema present.     Left lower  leg: Edema present.     Comments: Bilateral upper extremity edema.  All toes surgically amputated.  Skin:    Comments: Left upper extremity with some erythema confluent of the forearm, nontender, no fluctuance, no lesions noted.  Neurological:     Mental Status: She is alert.  Psychiatric:        Mood and Affect: Mood normal.        Behavior: Behavior normal.           Data Reviewed: CBG stable Potassium 3.4 Creatinine stable 1.24 Hemoglobin stable at 9.0 status post blood products INR down to 1.8 status post blood products  Family Communication: none   Disposition: Status is: Inpatient Remains inpatient appropriate because: see above     Time spent: 45 minutes  Author: Toribio Door, MD 06/03/2024 10:01 AM  For on call review [email or web address].

## 2024-06-03 NOTE — Progress Notes (Signed)
 Patient has not had episode of bloody emesis but did have large bowel movement that contained what appeared to be old blood and was dark red in color. PT remained asymptomatic afterwards.

## 2024-06-03 NOTE — Progress Notes (Signed)
 Subjective: Patient had 1 L of bloody emesis yesterday evening, since then has not had any further vomiting or blood, has not had any bowel movements.  However, her blood pressure has been low, diastolic blood pressure consistently 20s with low MAP.  Objective: Vital signs in last 24 hours: Temp:  [95.1 F (35.1 C)-98 F (36.7 C)] 97.9 F (36.6 C) (09/10 1030) Pulse Rate:  [97-124] 114 (09/10 1030) Resp:  [0-24] 10 (09/10 1030) BP: (62-124)/(19-57) 111/30 (09/10 1030) SpO2:  [95 %-100 %] 100 % (09/10 1030) Weight change:  Last BM Date : 06/01/24 (per par)  PE: Ill-appearing GENERAL: Severely pale  ABDOMEN: Nondistended nontender EXTREMITIES: Edematous bilateral upper and lower extremities  Lab Results: Results for orders placed or performed during the hospital encounter of 06/02/24 (from the past 48 hours)  CBG monitoring, ED     Status: Abnormal   Collection Time: 06/02/24 10:22 AM  Result Value Ref Range   Glucose-Capillary 143 (H) 70 - 99 mg/dL    Comment: Glucose reference range applies only to samples taken after fasting for at least 8 hours.  CBC with Differential     Status: Abnormal   Collection Time: 06/02/24 10:29 AM  Result Value Ref Range   WBC 23.4 (H) 4.0 - 10.5 K/uL   RBC 1.84 (L) 3.87 - 5.11 MIL/uL   Hemoglobin 5.7 (LL) 12.0 - 15.0 g/dL    Comment: REPEATED TO VERIFY This critical result has been called to SAVOIE, B by YASMIN Budge on 06/02/2024 12:09:22, and has been read back.    HCT 19.1 (L) 36.0 - 46.0 %   MCV 103.8 (H) 80.0 - 100.0 fL   MCH 31.0 26.0 - 34.0 pg   MCHC 29.8 (L) 30.0 - 36.0 g/dL   RDW 78.7 (H) 88.4 - 84.4 %   Platelets 260 150 - 400 K/uL   nRBC 0.1 0.0 - 0.2 %   Neutrophils Relative % 88 %   Neutro Abs 20.7 (H) 1.7 - 7.7 K/uL   Lymphocytes Relative 3 %   Lymphs Abs 0.7 0.7 - 4.0 K/uL   Monocytes Relative 7 %   Monocytes Absolute 1.6 (H) 0.1 - 1.0 K/uL   Eosinophils Relative 0 %   Eosinophils Absolute 0.0 0.0 - 0.5 K/uL   Basophils  Relative 0 %   Basophils Absolute 0.0 0.0 - 0.1 K/uL   WBC Morphology MORPHOLOGY UNREMARKABLE    Smear Review Normal platelet morphology    Immature Granulocytes 2 %   Abs Immature Granulocytes 0.43 (H) 0.00 - 0.07 K/uL   Burr Cells PRESENT     Comment: Performed at [facility], [address] [place], [place] [address]  Comprehensive metabolic panel     Status: Abnormal   Collection Time: 06/02/24 10:29 AM  Result Value Ref Range   Sodium 139 135 - 145 mmol/L   Potassium 4.1 3.5 - 5.1 mmol/L   Chloride 103 98 - 111 mmol/L   CO2 11 (L) 22 - 32 mmol/L   Glucose, Bld 183 (H) 70 - 99 mg/dL    Comment: Glucose reference range applies only to samples taken after fasting for at least 8 hours.   BUN 41 (H) 8 - 23 mg/dL   Creatinine, Ser 8.23 (H) 0.44 - 1.00 mg/dL   Calcium  8.9 8.9 - 10.3 mg/dL   Total Protein 3.9 (L) 6.5 - 8.1 g/dL   Albumin  1.8 (L) 3.5 - 5.0 g/dL   AST 36 15 - 41 U/L  ALT 19 0 - 44 U/L   Alkaline Phosphatase 175 (H) 38 - 126 U/L   Total Bilirubin 0.3 0.0 - 1.2 mg/dL   GFR, Estimated 28 (L) >60 mL/min    Comment: (NOTE) Calculated using the CKD-EPI Creatinine Equation (2021)    Anion gap 25 (H) 5 - 15    Comment: Performed at [facility], [address], [place], [place] [address]  Lipase, blood     Status: Abnormal   Collection Time: 06/02/24 10:29 AM  Result Value Ref Range   Lipase <10 (L) 11 - 51 U/L    Comment: Performed at [facility], [address] [place], [place] [address]  Type and screen [facility]     Status: None (Preliminary result)   Collection Time: 06/02/24 11:05 AM  Result Value Ref Range   ABO/RH(D) O POS    Antibody Screen NEG    Sample Expiration 06/05/2024,2359    Unit Number [id number]    Blood Component Type RED CELLS,LR    Unit division 00    Status of Unit ISSUED    Transfusion Status OK TO TRANSFUSE    Crossmatch Result Compatible    Unit Number  [id number]    Blood Component Type RED CELLS,LR    Unit division 00    Status of Unit ISSUED    Transfusion Status OK TO TRANSFUSE    Crossmatch Result Compatible    Unit Number [id number]    Blood Component Type RED CELLS,LR    Unit division 00    Status of Unit ISSUED    Transfusion Status OK TO TRANSFUSE    Crossmatch Result      Compatible Performed at [facility], [address], [place], [place] [address]   Protime-INR     Status: Abnormal   Collection Time: 06/02/24 12:34 PM  Result Value Ref Range   Prothrombin  Time 62.1 (H) 11.4 - 15.2 seconds   INR 6.9 (HH) 0.8 - 1.2    Comment: CRITICAL RESULT CALLED TO, READ BACK BY AND VERIFIED WITH: B. SIMMONS 1427 06/02/24 BY JE (NOTE) INR goal varies based on device and disease states. Performed at [facility], [address] [place], [place] [address]   APTT     Status: Abnormal   Collection Time: 06/02/24 12:34 PM  Result Value Ref Range   aPTT 51 (H) 24 - 36 seconds    Comment:        IF BASELINE aPTT IS ELEVATED, SUGGEST PATIENT RISK ASSESSMENT BE USED TO DETERMINE APPROPRIATE ANTICOAGULANT THERAPY. Performed at [facility] [address], [place], [place] [address]   Prepare RBC (crossmatch)     Status: None   Collection Time: 06/02/24 12:48 PM  Result Value Ref Range   Order Confirmation      ORDER PROCESSED BY BLOOD BANK Performed at [facility], [address], [place], [place] [address]   Prepare RBC (crossmatch)     Status: None   Collection Time: 06/02/24  1:36 PM  Result Value Ref Range   Order Confirmation      ORDER PROCESSED BY BLOOD BANK Performed at [facility], [address], [place], [place] [address]   Prepare fresh frozen plasma     Status: None (Preliminary result)   Collection Time: 06/02/24  2:33 PM  Result Value Ref Range   Unit Number [id number]    Blood Component Type THW PLS  APHR    Unit division A0    Status of Unit ISSUED    Transfusion Status      OK TO TRANSFUSE Performed at [facility], [address] [place], [place] [address]   MRSA Next Gen by PCR, Nasal     Status: Abnormal   Collection Time: 06/02/24  4:26 PM   Specimen: Nasal Mucosa; Nasal Swab  Result Value Ref Range   MRSA by PCR Next Gen DETECTED (A) NOT DETECTED    Comment: (NOTE) The GeneXpert MRSA Assay (FDA approved for NASAL specimens only), is one component of a comprehensive MRSA colonization surveillance program. It is not intended to diagnose MRSA infection nor to guide or monitor treatment for MRSA infections. Test performance is not FDA approved in patients less than 58 years old. Performed at [facility], [address], [place], [place] [address]   Prepare fresh frozen plasma     Status: None (Preliminary result)   Collection Time: 06/02/24  5:49 PM  Result Value Ref Range   Unit Number [id number]    Blood Component Type THW PLS APHR    Unit division B0    Status of Unit ISSUED    Transfusion Status      OK TO TRANSFUSE Performed at [facility], [address], [place], [place] [address]   Blood culture (routine x 2)     Status: None (Preliminary result)   Collection Time: 06/02/24  5:55 PM   Specimen: BLOOD RIGHT ARM  Result Value Ref Range   Specimen Description      BLOOD RIGHT ARM Performed at [facility], [address], [place], [place] [address]    Special Requests      BOTTLES DRAWN AEROBIC AND ANAEROBIC Blood Culture adequate volume Performed at [facility], [address], [place], [place] [address]    Culture      NO GROWTH < 12 HOURS Performed at [facility], [address], [place], [place] [address]    Report Status PENDING   Blood culture (routine x 2)     Status: None (Preliminary result)   Collection Time: 06/02/24  5:57 PM   Specimen: BLOOD RIGHT  ARM  Result Value Ref Range   Specimen Description      BLOOD RIGHT ARM Performed at [facility], [address], [place], [place] [address]    Special Requests      BOTTLES DRAWN AEROBIC AND ANAEROBIC Blood Culture adequate volume Performed at [facility], [address] [place], [place] [address]    Culture      NO GROWTH < 12 HOURS Performed at [facility], [address], [place], [place] [address]    Report Status PENDING   Glucose, capillary     Status: Abnormal   Collection Time: 06/02/24  6:41 PM  Result Value Ref Range   Glucose-Capillary 161 (H) 70 - 99 mg/dL    Comment: Glucose reference range applies only to samples taken after fasting for at least 8 hours.  Lactic acid, plasma     Status: Abnormal   Collection Time: 06/02/24  7:02 PM  Result Value Ref Range   Lactic Acid, Venous 4.7 (HH) 0.5 - 1.9 mmol/L    Comment: Critical Value, Read Back and verified with CANDIE MERLES, RN 06/02/24 1941 J.COLE Performed at [facility], [address] [place], [place] [address]   Hemoglobin  and hematocrit, blood     Status: Abnormal   Collection Time: 06/02/24  7:02 PM  Result Value Ref Range   Hemoglobin 10.0 (L) 12.0 - 15.0 g/dL    Comment: REPEATED TO VERIFY POST TRANSFUSION SPECIMEN DELTA CHECK NOTED    HCT 30.8 (L) 36.0 - 46.0 %    Comment: Performed at [facility], [address] [place], [place] [address]  Lactic acid, plasma     Status: Abnormal   Collection Time: 06/02/24  9:29 PM  Result Value Ref Range   Lactic Acid, Venous 4.7 (HH) 0.5 - 1.9 mmol/L    Comment: Critical value noted. Value is consistent with previously reported and called value  Performed at [facility], [address] [place], [place] [address]   Glucose, capillary     Status: Abnormal   Collection Time: 06/02/24  9:51 PM  Result Value Ref Range   Glucose-Capillary 139 (H) 70 - 99 mg/dL    Comment:  Glucose reference range applies only to samples taken after fasting for at least 8 hours.  Glucose, capillary     Status: Abnormal   Collection Time: 06/02/24 11:53 PM  Result Value Ref Range   Glucose-Capillary 125 (H) 70 - 99 mg/dL    Comment: Glucose reference range applies only to samples taken after fasting for at least 8 hours.  Protime-INR     Status: Abnormal   Collection Time: 06/03/24  1:11 AM  Result Value Ref Range   Prothrombin  Time 22.3 (H) 11.4 - 15.2 seconds   INR 1.9 (H) 0.8 - 1.2    Comment: (NOTE) INR goal varies based on device and disease states. Performed at [facility], [address], [place], [place] [address]   Basic metabolic panel     Status: Abnormal   Collection Time: 06/03/24  1:11 AM  Result Value Ref Range   Sodium 141 135 - 145 mmol/L   Potassium 3.6 3.5 - 5.1 mmol/L   Chloride 108 98 - 111 mmol/L   CO2 21 (L) 22 - 32 mmol/L   Glucose, Bld 122 (H) 70 - 99 mg/dL    Comment: Glucose reference range applies only to samples taken after fasting for at least 8 hours.   BUN 39 (H) 8 - 23 mg/dL   Creatinine, Ser 8.64 (H) 0.44 - 1.00 mg/dL   Calcium  8.5 (L) 8.9 - 10.3 mg/dL   GFR, Estimated 39 (L) >60 mL/min    Comment: (NOTE) Calculated using the CKD-EPI Creatinine Equation (2021)    Anion gap 13 5 - 15    Comment: Performed at [facility], [address] [place], [place] [address]  CBC     Status: Abnormal   Collection Time: 06/03/24  1:11 AM  Result Value Ref Range   WBC 14.9 (H) 4.0 - 10.5 K/uL   RBC 3.17 (L) 3.87 - 5.11 MIL/uL   Hemoglobin 9.1 (L) 12.0 - 15.0 g/dL   HCT 72.3 (L) 63.9 - 53.9 %   MCV 87.1 80.0 - 100.0 fL    Comment: REPEATED TO VERIFY POST TRANSFUSION SPECIMEN DELTA CHECK NOTED    MCH 28.7 26.0 - 34.0 pg   MCHC 33.0 30.0 - 36.0 g/dL   RDW 79.2 (H) 88.4 - 84.4 %   Platelets 191 150 - 400 K/uL   nRBC 0.5 (H) 0.0 - 0.2 %    Comment: Performed at [facility], [address] [place], [place]  [address]  Glucose, capillary     Status: Abnormal   Collection Time: 06/03/24  3:50 AM  Result Value Ref Range   Glucose-Capillary 119 (H) 70 - 99 mg/dL    Comment: Glucose reference range applies only to samples taken after fasting for at least 8 hours.  Glucose, capillary     Status: None   Collection Time: 06/03/24  7:45 AM  Result Value Ref Range   Glucose-Capillary 95 70 - 99 mg/dL    Comment: Glucose reference range applies only to samples taken after fasting for at least 8 hours.  CBC     Status: Abnormal   Collection Time: 06/03/24  8:06 AM  Result Value Ref Range   WBC 13.3 (H) 4.0 - 10.5 K/uL   RBC 3.12 (L) 3.87 - 5.11 MIL/uL   Hemoglobin 9.0 (L) 12.0 - 15.0 g/dL   HCT 72.6 (L) 63.9 - 53.9 %   MCV 87.5 80.0 - 100.0 fL   MCH 28.8 26.0 - 34.0 pg   MCHC 33.0 30.0 - 36.0 g/dL   RDW 78.8 (H) 88.4 - 84.4 %   Platelets 189 150 - 400 K/uL   nRBC 0.7 (H) 0.0 - 0.2 %    Comment: Performed at [facility], [address], [place], [place] [address]  Protime-INR     Status: Abnormal   Collection Time: 06/03/24  8:06 AM  Result Value Ref Range   Prothrombin  Time 21.9 (H) 11.4 - 15.2 seconds   INR 1.8 (H) 0.8 - 1.2    Comment: (NOTE) INR goal varies based on device and disease states. Performed at [facility], [address], [place], [place] [address]   Basic metabolic panel     Status: Abnormal   Collection Time: 06/03/24  8:06 AM  Result Value Ref Range   Sodium 143 135 - 145 mmol/L   Potassium 3.4 (L) 3.5 - 5.1 mmol/L   Chloride 110 98 - 111 mmol/L   CO2 21 (L) 22 - 32 mmol/L   Glucose, Bld 98 70 - 99 mg/dL    Comment: Glucose reference range applies only to samples taken after fasting for at least 8 hours.   BUN 40 (H) 8 - 23 mg/dL   Creatinine, Ser 8.75 (H) 0.44 - 1.00 mg/dL   Calcium  8.2 (L) 8.9 - 10.3 mg/dL   GFR, Estimated 43 (L) >60 mL/min    Comment: (NOTE) Calculated using the CKD-EPI Creatinine  Equation (2021)    Anion gap 11 5 - 15    Comment: Performed at [facility], [address], [place], [place] [address]   *Note: Due to a large number of results and/or encounters for the requested time period, some results have not been displayed. A complete set of results can be found in Results Review.    Studies/Results: CT CHEST ABDOMEN PELVIS WO CONTRAST Result Date: 06/02/2024 CLINICAL DATA:  known biliary cancer. worsening leukocytosis. Eval for abscess. Stent in CBD. * Tracking Code: BO * EXAM: CT CHEST, ABDOMEN AND PELVIS WITHOUT CONTRAST TECHNIQUE: Multidetector CT imaging of the chest, abdomen and pelvis was performed following the standard protocol without IV contrast. RADIATION DOSE REDUCTION: This exam was performed according to the departmental dose-optimization program which includes automated exposure control, adjustment of the mA and/or kV according to patient size and/or use of iterative reconstruction technique. COMPARISON:  CT scan chest, abdomen and pelvis from 04/23/2024. FINDINGS: CT CHEST FINDINGS Cardiovascular: Normal cardiac size. No pericardial effusion. No  aortic aneurysm. There are coronary artery calcifications, in keeping with coronary artery disease. There are also moderate to severe peripheral atherosclerotic vascular calcifications of thoracic aorta and its major branches. Mediastinum/Nodes: Visualized thyroid  gland appears grossly unremarkable. No solid / cystic mediastinal masses. There is fluid-filled and dilated esophagus, which is nonspecific but most likely seen in the settings of chronic gastroesophageal reflux disease versus esophageal dysmotility. No mediastinal or axillary lymphadenopathy by size criteria. Evaluation of bilateral hila is limited due to lack on intravenous contrast: however, no large hilar lymphadenopathy identified. Lungs/Pleura: The central tracheo-bronchial tree is patent. Since the prior study, there are new airspace  opacities in the left lung lower lobe, highly concerning for bronchopneumonia. Follow-up to clearing is recommended. No pleural effusion or pneumothorax. No lung mass or collapse. There are multiple scattered sub 4 mm noncalcified nodules in the right lung (marked with electronic arrow sign on series 100), which are unchanged since the prior study. No new suspicious lung nodule seen. Musculoskeletal: The visualized soft tissues of the chest wall are grossly unremarkable. No suspicious osseous lesions. There are mild to moderate multilevel degenerative changes in the visualized spine. CT ABDOMEN PELVIS FINDINGS Hepatobiliary: The liver is normal in size. Non-cirrhotic configuration. Previously seen hypoattenuating lesion in the segment 4A is not well seen on today's exam due to lack of intravenous contrast. There is an irregular hypoattenuating lesion in the right hepatic lobe, segment 4B (series 2, image 54), which appears decreased in size since the prior study. No other discrete liver lesion seen within the limitations of this unenhanced exam. Contrast-enhanced MRI or CT scan exam is recommended for better evaluation of liver lesions. No intrahepatic bile duct dilation. Left hepatic lobe pneumobilia noted. There is stable positioning of the biliary stent. Gallbladder is surgically absent. Pancreas: Unremarkable. No pancreatic ductal dilatation or surrounding inflammatory changes. Spleen: Within normal limits. No focal lesion. Adrenals/Urinary Tract: Adrenal glands are unremarkable. No suspicious renal mass within the limitations of this unenhanced exam. No nephroureterolithiasis or obstructive uropathy. Bilateral extensive renal vascular calcifications noted. Unremarkable urinary bladder. Stomach/Bowel: No disproportionate dilation of the small or large bowel loops. No evidence of abnormal bowel wall thickening or inflammatory changes. The appendix was not visualized; however there is no acute inflammatory process  in the right lower quadrant. Vascular/Lymphatic: There is mild ascites in the dependent pelvis. No walled-off abscess. No pneumoperitoneum. No abdominal or pelvic lymphadenopathy, by size criteria. No aneurysmal dilation of the major abdominal arteries. There are marked peripheral atherosclerotic vascular calcifications of the aorta and its major branches. Reproductive: The uterus is unremarkable. No large adnexal mass. Other: There is decubitus ulcer overlying the midline/right paramedian gluteal region. Underlying coccyx appears intact. No focal bone erosions. There is moderate anasarca. Musculoskeletal: No suspicious osseous lesions. There are mild - moderate multilevel degenerative changes in the visualized spine. IMPRESSION: 1. There are new airspace opacities in the left lung lower lobe, compatible with pneumonia. Follow-up to clearing is recommended. 2. There is a decubitus ulcer overlying the midline/right paramedian gluteal region. Underlying coccyx appears intact. No focal bone erosions. 3. Mild ascites. No walled-off abscess. No walled-off abscess or pneumoperitoneum. 4. Multiple other nonacute observations, as described above. Aortic Atherosclerosis (ICD10-I70.0). Electronically Signed   By: Ree Molt M.D.   On: 06/02/2024 14:43   DG Chest Portable 1 View Result Date: 06/02/2024 CLINICAL DATA:  Infectious evaluation. EXAM: PORTABLE CHEST 1 VIEW COMPARISON:  05/12/2024. FINDINGS: The heart size and mediastinal contours are unchanged. Prior median sternotomy and CABG.  Left chest Port-A-Cath is unchanged. No focal consolidation, pleural effusion, or pneumothorax. No acute osseous abnormality. IMPRESSION: No acute cardiopulmonary findings. Electronically Signed   By: Harrietta Sherry M.D.   On: 06/02/2024 11:42    Medications: I have reviewed the patient's current medications.  Assessment: Hematemesis, coagulopathy, last use of Xarelto  yesterday  Persistently hypotensive, has received lactated  "Ringer's"  500 mL x 2 IV bolus  Hemoglobin dropped down to 5.7, subsequently posttransfusion hemoglobin has been 10/9.1/9, she is receiving her fourth unit of PRBC transfusion, has received 2 units of FFP and vitamin K  10 mg IV x 1 and Kcentra  for Xarelto  reversal INR has improved from 6.9-1.9 and 1.1 today, PT decrease from 62.1-22.3 and 21.9  BUN 40/creatinine 1.2/GFR 43 Bicarb 21, potassium 3.4 Lactic acid persistently elevated at 4.7  Comorbidities: Gallbladder cancer with liver metastases, ascites: Status post chemotherapy and radiation, recently started on immune therapy  Plan: As patient is not hemodynamically stable, we are holding off EGD as use of propofol  will worsen hypertension. Fortunately she has not had any further episodes of hematemesis and there has been no documentation of melena or hematochezia. Will start on clear liquid diet and keep her n.p.o. postmidnight and plan EGD for a.m.. Will increase Protonix  to 40 mg IV every 6 hours. Monitor H&H and transfuse as needed. Guarded prognosis.  [name], MD 06/03/2024, 10:52 AM

## 2024-06-03 NOTE — Hospital Course (Addendum)
 83 year old woman PMH including gallbladder cancer, chronic anemia requiring multiple transfusions, presented with hemoglobin of 6.7, had 2 episodes of hematemesis.  Admitted for further evaluation.  Consultants GI  Procedures/Events

## 2024-06-04 ENCOUNTER — Encounter (HOSPITAL_COMMUNITY)
Admission: EM | Disposition: A | Discharge status: Home Health | Payer: Self-pay | Source: Ambulatory Visit | Attending: Family Medicine

## 2024-06-04 ENCOUNTER — Encounter (HOSPITAL_COMMUNITY): Payer: Self-pay | Admitting: Anesthesiology

## 2024-06-04 ENCOUNTER — Encounter (HOSPITAL_COMMUNITY): Payer: Self-pay | Admitting: Internal Medicine

## 2024-06-04 ENCOUNTER — Inpatient Hospital Stay (HOSPITAL_COMMUNITY): Payer: Self-pay | Admitting: Anesthesiology

## 2024-06-04 DIAGNOSIS — K920 Hematemesis: Secondary | ICD-10-CM

## 2024-06-04 DIAGNOSIS — E1122 Type 2 diabetes mellitus with diabetic chronic kidney disease: Secondary | ICD-10-CM

## 2024-06-04 DIAGNOSIS — I480 Paroxysmal atrial fibrillation: Secondary | ICD-10-CM

## 2024-06-04 DIAGNOSIS — C23 Malignant neoplasm of gallbladder: Secondary | ICD-10-CM | POA: Diagnosis not present

## 2024-06-04 DIAGNOSIS — L8994 Pressure ulcer of unspecified site, stage 4: Secondary | ICD-10-CM | POA: Diagnosis not present

## 2024-06-04 DIAGNOSIS — K259 Gastric ulcer, unspecified as acute or chronic, without hemorrhage or perforation: Secondary | ICD-10-CM

## 2024-06-04 DIAGNOSIS — D649 Anemia, unspecified: Secondary | ICD-10-CM | POA: Diagnosis not present

## 2024-06-04 DIAGNOSIS — I2510 Atherosclerotic heart disease of native coronary artery without angina pectoris: Secondary | ICD-10-CM | POA: Diagnosis not present

## 2024-06-04 DIAGNOSIS — I252 Old myocardial infarction: Secondary | ICD-10-CM | POA: Diagnosis not present

## 2024-06-04 DIAGNOSIS — N1831 Chronic kidney disease, stage 3a: Secondary | ICD-10-CM

## 2024-06-04 HISTORY — PX: ESOPHAGOGASTRODUODENOSCOPY: SHX5428

## 2024-06-04 LAB — TYPE AND SCREEN
ABO/RH(D): O POS
Antibody Screen: NEGATIVE
Unit division: 0
Unit division: 0
Unit division: 0

## 2024-06-04 LAB — BPAM RBC
Blood Product Expiration Date: 202510062359
Blood Product Expiration Date: 202510052359
Blood Product Expiration Date: 202510062359
ISSUE DATE / TIME: 202509091309
ISSUE DATE / TIME: 202509091405
ISSUE DATE / TIME: 202509100758
Unit Type and Rh: 202510062359
Unit Type and Rh: 5100
Unit Type and Rh: 5100
Unit Type and Rh: 5100

## 2024-06-04 LAB — CBC
HCT: 32.2 % — ABNORMAL LOW (ref 36.0–46.0)
HCT: 33.1 % — ABNORMAL LOW (ref 36.0–46.0)
Hemoglobin: 10.1 g/dL — ABNORMAL LOW (ref 12.0–15.0)
Hemoglobin: 10.6 g/dL — ABNORMAL LOW (ref 12.0–15.0)
MCH: 27.2 pg (ref 26.0–34.0)
MCH: 27.5 pg (ref 26.0–34.0)
MCHC: 31.4 g/dL (ref 30.0–36.0)
MCHC: 32 g/dL (ref 30.0–36.0)
MCV: 86.6 fL (ref 80.0–100.0)
MCV: 86 fL (ref 80.0–100.0)
Platelets: 159 K/uL (ref 150–400)
Platelets: 170 K/uL (ref 150–400)
RBC: 3.72 MIL/uL — ABNORMAL LOW (ref 3.87–5.11)
RBC: 3.85 MIL/uL — ABNORMAL LOW (ref 3.87–5.11)
RDW: 21.2 % — ABNORMAL HIGH (ref 11.5–15.5)
RDW: 21.3 % — ABNORMAL HIGH (ref 11.5–15.5)
WBC: 12.4 K/uL — ABNORMAL HIGH (ref 4.0–10.5)
WBC: 14 K/uL — ABNORMAL HIGH (ref 4.0–10.5)
nRBC: 0.6 % — ABNORMAL HIGH (ref 0.0–0.2)
nRBC: 0.5 % — ABNORMAL HIGH (ref 0.0–0.2)

## 2024-06-04 LAB — BASIC METABOLIC PANEL WITH GFR
Anion gap: 10 (ref 5–15)
BUN: 38 mg/dL — ABNORMAL HIGH (ref 8–23)
CO2: 22 mmol/L (ref 22–32)
Calcium: 8.6 mg/dL — ABNORMAL LOW (ref 8.9–10.3)
Chloride: 110 mmol/L (ref 98–111)
Creatinine, Ser: 1.23 mg/dL — ABNORMAL HIGH (ref 0.44–1.00)
GFR, Estimated: 44 mL/min — ABNORMAL LOW (ref >60)
Glucose, Bld: 160 mg/dL — ABNORMAL HIGH (ref 70–99)
Potassium: 3.5 mmol/L (ref 3.5–5.1)
Sodium: 142 mmol/L (ref 135–145)

## 2024-06-04 LAB — GLUCOSE, CAPILLARY
Glucose-Capillary: 151 mg/dL — ABNORMAL HIGH (ref 70–99)
Glucose-Capillary: 124 mg/dL — ABNORMAL HIGH (ref 70–99)
Glucose-Capillary: 97 mg/dL (ref 70–99)
Glucose-Capillary: 99 mg/dL (ref 70–99)
Glucose-Capillary: 153 mg/dL — ABNORMAL HIGH (ref 70–99)
Glucose-Capillary: 119 mg/dL — ABNORMAL HIGH (ref 70–99)

## 2024-06-04 LAB — MAGNESIUM: Magnesium: 2 mg/dL (ref 1.7–2.4)

## 2024-06-04 SURGERY — EGD (ESOPHAGOGASTRODUODENOSCOPY)
Anesthesia: Monitor Anesthesia Care

## 2024-06-04 MED ORDER — SODIUM CHLORIDE 0.9 % IV SOLN: INTRAVENOUS | Status: DC

## 2024-06-04 MED ORDER — PANTOPRAZOLE SODIUM 40 MG IV SOLR
40.0000 mg | Freq: Two times a day (BID) | INTRAVENOUS | Status: AC
Start: 2024-06-04 — End: ?
  Administered 2024-06-04 – 2024-06-09 (×10): 40 mg via INTRAVENOUS
  Filled 2024-06-04 (×10): qty 10

## 2024-06-04 MED ORDER — LIDOCAINE 2% (20 MG/ML) 5 ML SYRINGE
INTRAMUSCULAR | Status: DC | PRN
  Administered 2024-06-04: 60 mg via INTRAVENOUS

## 2024-06-04 MED ORDER — MUPIROCIN 2 % EX OINT
1.0000 | TOPICAL_OINTMENT | Freq: Two times a day (BID) | CUTANEOUS | Status: AC
  Administered 2024-06-04 – 2024-06-08 (×10): 1 via NASAL
  Filled 2024-06-04 (×2): qty 22

## 2024-06-04 MED ORDER — SUCRALFATE 1 GM/10ML PO SUSP
1.0000 g | Freq: Three times a day (TID) | ORAL | Status: DC
  Administered 2024-06-04 – 2024-06-09 (×21): 1 g via ORAL
  Filled 2024-06-04 (×21): qty 10

## 2024-06-04 MED ORDER — PROPOFOL 10 MG/ML IV BOLUS
INTRAVENOUS | Status: DC | PRN
  Administered 2024-06-04: 50 mg via INTRAVENOUS
  Administered 2024-06-04: 20 mg via INTRAVENOUS

## 2024-06-04 MED ORDER — POTASSIUM CHLORIDE 10 MEQ/100ML IV SOLN
10.0000 meq | INTRAVENOUS | Status: AC
  Administered 2024-06-04 (×4): 10 meq via INTRAVENOUS
  Filled 2024-06-04 (×4): qty 100

## 2024-06-04 MED ORDER — LACTATED RINGERS IV BOLUS
500.0000 mL | Freq: Once | INTRAVENOUS | Status: AC
  Administered 2024-06-04: 500 mL via INTRAVENOUS

## 2024-06-04 MED ORDER — LACTATED RINGERS IV SOLN: INTRAVENOUS | Status: DC

## 2024-06-04 NOTE — Progress Notes (Addendum)
 Progress Note   Patient: [name] FMW:[id number] DOB: [birth year] DOA: 06/02/2024     2 DOS: the patient was seen and examined on 06/04/2024   Brief hospital course: 83 year old woman PMH including gallbladder cancer, chronic anemia requiring multiple transfusions, presented with hemoglobin of 6.7, had 2 episodes of hematemesis.  Admitted for further evaluation.  Transfused multiple units of blood.  Plan for EGD.  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  Hemoglobin down a bit today but appropriately rose yesterday with PRBCs.  May be equilibration.   Repeat CBC today  GI plans EGD, blood pressure a bit low 9/10 which precluded procedure.  Hopefully can proceed with today as condition appears better.   Recently seen as consult on 05/23/2024 for symptomatic anemia which was thought to be multifactorial: Sacral ulcer with chronic oozing, on Xarelto , gallbladder cancer with metastases, chemotherapy, radiation, hemorrhoidal blood loss.  Trend hemoglobin  AKI CKD stage II CKD stage III ruled out Baseline creatinine around 1.0.  Last 4 months creatinine 1.65. Creatinine stable, will follow intermittently.  Hypomagnesemia repleted Potassium borderline, will replete   PAF Acquired thrombophilia Currently in sinus rhythm.  Hold Xarelto  given above.  Holding metoprolol  given soft blood pressures yesterday.   DM 2-non-insulin -dependent Hold home Jardiance  and metformin  CBG stable.  Continue moderate dose sliding scale   Elevated INR-due to Xarelto , last taken 9/8 Significance unclear, treated with FFP.  Monitor clinically.   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 Lanny   Essential hypertension Asymptomatic hypotension, diastolic No signs or symptoms of sepsis.  Morning cortisol was appropriate. Hypotension improved, MAP 66.   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  No evidence of ongoing infection.  Left upper extremity redness present for 3 weeks and not suggestive of infective cellulitis.  Multiple abnormalities probably related to anemia and other comorbidities. Monitor off antibiotics. CT findings noted, however there are no signs or symptoms to suggest pneumonia.  This could have been aspiration.  Continue to monitor clinically.  Deep Tissue Pressure Injury R ankle, present on admission (at the time of the admission order)    Goals of care: Patient confirms full code at bedside. Fifth admission in last 4 months    Subjective:  Review of the last 24 hours: Received fourth unit PRBC yesterday, seen by gastroenterology, EGD was held with plans for EGD today if blood pressure stable.  Protonix  increased.  Per nursing note last evening patient had large bowel movement with possible old blood, dark red in color, no bloody emesis.  Feels well today, no complaints, no pain except in ankles, no bleeding reported  Physical Exam: Vitals:   06/04/24 0500 06/04/24 0600 06/04/24 0700 06/04/24 0800  BP: (!) 102/34 (!) 118/35 (!) 101/27 (!) 117/37  Pulse: 95 (!) 102 96 (!) 101  Resp: 13 11 15 13   Temp:      TempSrc:      SpO2: 98% 98% 98% 99%  Weight:      Height:      Overall vital signs  are stable with improved blood pressure with a few lows.  Physical Exam Vitals and nursing note reviewed.  Constitutional:      General: She is not in acute distress.    Appearance: She is not ill-appearing or toxic-appearing.  Cardiovascular:      Rate and Rhythm: Normal rate and regular rhythm.     Heart sounds: No murmur heard. Pulmonary:     Effort: Pulmonary effort is normal. No respiratory distress.     Breath sounds: No wheezing, rhonchi or rales.  Neurological:     Mental Status: She is alert.  Psychiatric:        Mood and Affect: Mood normal.        Behavior: Behavior normal.     Data Reviewed: CBG stable Creatinine stable at 1.23, magnesium  within normal limits, potassium borderline will replete Hemoglobin down from 11.9 yesterday to 10.1 today.  Family Communication: None present transfuse multiple units of blood  Disposition: Status is: Inpatient Remains inpatient appropriate because: Bleeding     Time spent: 35 minutes  Author: Toribio Door, MD 06/04/2024 8:29 AM  For on call review [email or web address].

## 2024-06-04 NOTE — Transfer of Care (Signed)
 Immediate Anesthesia Transfer of Care Note  Patient: [name]  Procedure(s) Performed: EGD (ESOPHAGOGASTRODUODENOSCOPY)  Patient Location: PACU  Anesthesia Type:MAC  Level of Consciousness: awake, alert , and oriented  Airway & Oxygen  Therapy: Patient Spontanous Breathing and Patient connected to face mask oxygen   Post-op Assessment: Report given to RN and Post -op Vital signs reviewed and stable  Post vital signs: Reviewed and stable  Last Vitals:  Vitals Value Taken Time  BP 111/42 06/04/24 14:20  Temp 36.1 C 06/04/24 14:12  Pulse 88 06/04/24 14:22  Resp 7 06/04/24 14:22  SpO2 100 % 06/04/24 14:22  Vitals shown include unfiled device data.  Last Pain:  Vitals:   06/04/24 1420  TempSrc:   PainSc: 0-No pain         Complications: No notable events documented.

## 2024-06-04 NOTE — Plan of Care (Signed)
 Gastric ulcers biopsied, had one medium type 7 stool (black/old blood) in PACU and one after returning to unit.  BP remained stable throughout the day.

## 2024-06-04 NOTE — Interval H&P Note (Signed)
 History and Physical Interval Note: 83/female with hematemesis, was on xarelto , has history of GB cancer for EGD with propofol .  06/04/2024 1:28 PM  [name]  has presented today for EGD with propofol , with the diagnosis of Hematemesis.  The various methods of treatment have been discussed with the patient and family. After consideration of risks, benefits and other options for treatment, the patient has consented to  Procedure(s): EGD (ESOPHAGOGASTRODUODENOSCOPY) (N/A) as a surgical intervention.  The patient's history has been reviewed, patient examined, no change in status, stable for surgery.  I have reviewed the patient's chart and labs.  Questions were answered to the patient's satisfaction.     [name]

## 2024-06-04 NOTE — Op Note (Signed)
 [facility] Patient Name: [name] Procedure Date: 06/04/2024 MRN: [id number] Attending MD: [name] , MD, [id number] Date of Birth: 03-May-1941 CSN: [id number] Age: 83 Admit Type: Outpatient Procedure:                Upper GI endoscopy Indications:              Acute post hemorrhagic anemia, Hematemesis, Melena Providers:                [name], MD, Hoy Penner, RN, Curtistine Bishop, Technician Referring MD:             [facility] Medicines:                Monitored Anesthesia Care Complications:            No immediate complications. Estimated blood loss:                            Minimal. Estimated Blood Loss:     Estimated blood loss was minimal. Procedure:                Pre-Anesthesia Assessment:                           - Prior to the procedure, a History and Physical                            was performed, and patient medications and                            allergies were reviewed. The patient's tolerance of                            previous anesthesia was also reviewed. The risks                            and benefits of the procedure and the sedation                            options and risks were discussed with the patient.                            All questions were answered, and informed consent                            was obtained. Prior Anticoagulants: The patient has                            taken Xarelto  (rivaroxaban ), last dose was 2 days                            prior to procedure. ASA Grade Assessment: III - A  patient with severe systemic disease. After                            reviewing the risks and benefits, the patient was                            deemed in satisfactory condition to undergo the                            procedure.                           After obtaining informed consent, the endoscope was                            passed under  direct vision. Throughout the                            procedure, the patient's blood pressure, pulse, and                            oxygen  saturations were monitored continuously. The                            [id number]-[id number] ([id number]) Olympus endoscope was introduced                            through the mouth, and advanced to the second part                            of duodenum. The upper GI endoscopy was                            accomplished without difficulty. The patient                            tolerated the procedure well. Scope In: Scope Out: Findings:      The examined esophagus was normal.      The Z-line was regular and was found 35 cm from the incisors.      A 5 cm hiatal hernia was present.      Clotted blood was found in the gastric body.      Many non-bleeding superficial gastric ulcers with pigmented material       were found in the cardia, in the gastric fundus, in the gastric body and       on the lesser curvature of the stomach. Biopsies were taken with a cold       forceps for Helicobacter pylori testing.      Diffuse severely erythematous mucosa without bleeding was found in the       cardia, in the gastric fundus, in the gastric body, on the greater       curvature of the stomach, on the lesser curvature of the stomach, at the       incisura, in the gastric antrum, in the prepyloric region of the stomach       and  at the pylorus.      A previously placed plastic biliary stent was seen in the second portion       of the duodenum. Impression:               - Normal esophagus.                           - Z-line regular, 35 cm from the incisors.                           - 5 cm hiatal hernia.                           - Clotted blood in the gastric body.                           - Non-bleeding gastric ulcers with pigmented                            material. Biopsied.                           - Erythematous mucosa in the cardia, gastric                             fundus, gastric body, greater curvature, lesser                            curvature, incisura, antrum, prepyloric region of                            the stomach and pylorus.                           - Plastic biliary stent in the duodenum. Moderate Sedation:      Patient did not receive moderate sedation for this procedure, but       instead received monitored anesthesia care. Recommendation:           - Full liquid diet.                           - Continue present medications.                           - Await pathology results.                           - Sucralfate  1 gm QID for 2 weeks,recommend                            Pantoprazole  40 mg twice a day indefinitely.                           - She is at very high risk of rebleeding and  recommend not using anticoagulation or antiplatelet                            if possible.                           - If she needs anticoagulation, it may be better to                            switch from xarelto  to eliquis. Procedure Code(s):        --- Professional ---                           [phone], Esophagogastroduodenoscopy, flexible,                            transoral; with biopsy, single or multiple Diagnosis Code(s):        --- Professional ---                           K44.9, Diaphragmatic hernia without obstruction or                            gangrene                           K92.2, Gastrointestinal hemorrhage, unspecified                           K25.9, Gastric ulcer, unspecified as acute or                            chronic, without hemorrhage or perforation                           K31.89, Other diseases of stomach and duodenum                           D62, Acute posthemorrhagic anemia                           K92.0, Hematemesis                           K92.1, Melena (includes Hematochezia) CPT copyright 2022 American Medical Association. All rights reserved. The codes documented in  this report are preliminary and upon coder review may  be revised to meet current compliance requirements. [name], MD 06/04/2024 2:06:05 PM This report has been signed electronically. Number of Addenda: 0

## 2024-06-04 NOTE — TOC Initial Note (Signed)
 Transition of Care [facility]) - Initial/Assessment Note    Patient Details  Name: [name] MRN: [id number] Date of Birth: 09-Oct-1940  Transition [facility] CM/SW Contact:    [name] ONEIDA Anon, RN Phone Number: 06/04/2024, 3:41 PM  Clinical Narrative:                 Pt from home, but PTA was at [facility] for STR. NCM spoke with pt daughter [name] via telephone at [phone] and she confirms at DC pt will return to [place], as she feels pt will not be strong enough to return home. NCM spoke with [facility] at [facility] and they state pt will be able to return at DC, but as pt is close to her 100 days, pt will have an OOP expense. NCM made sure pt daughter aware, and she voiced understanding. IP Care Management continuing to follow for any new recommendations or DC needs.     Expected Discharge Plan: Skilled Nursing Facility Barriers to Discharge: Continued Medical Work up   Patient Goals and CMS Choice Patient states their goals for this hospitalization and ongoing recovery are:: Return to [facility] CMS Medicare.gov Compare Post Acute Care list provided to:: Patient Choice offered to / list presented to : Patient [place] ownership interest in [facility]provided to:: Patient    Expected Discharge Plan and Services In-house Referral: NA Discharge Planning Services: CM Consult Post Acute Care Choice: Skilled Nursing Facility Living arrangements for the past 2 months: Skilled Nursing Facility                 DME Arranged: N/A DME Agency: NA       HH Arranged: NA HH Agency: NA        Prior Living Arrangements/Services Living arrangements for the past 2 months: Skilled Nursing Facility Lives with:: Facility Resident Patient language and need for interpreter reviewed:: Yes Do you feel safe going back to the place where you live?: Yes      Need for Family Participation in Patient Care: Yes (Comment) Care giver support system in  place?: Yes (comment) Current home services: DME Criminal Activity/Legal Involvement Pertinent to Current Situation/Hospitalization: No - Comment as needed  Activities of Daily Living   ADL Screening (condition at time of admission) Independently performs ADLs?: No Does the patient have a NEW difficulty with bathing/dressing/toileting/self-feeding that is expected to last >3 days?: Yes (Initiates electronic notice to provider for possible OT consult) Does the patient have a NEW difficulty with getting in/out of bed, walking, or climbing stairs that is expected to last >3 days?: Yes (Initiates electronic notice to provider for possible PT consult) Does the patient have a NEW difficulty with communication that is expected to last >3 days?: No Is the patient deaf or have difficulty hearing?: Yes Does the patient have difficulty seeing, even when wearing glasses/contacts?: No Does the patient have difficulty concentrating, remembering, or making decisions?: No  Permission Sought/Granted Permission sought to share information with : Family Supports, Oceanographer granted to share information with : Yes, Verbal Permission Granted  Share Information with NAME: [name] (Daughter)  [phone]  Permission granted to share info w AGENCY: [facility]        Emotional Assessment Appearance:: Appears stated age Attitude/Demeanor/Rapport: Unable to Assess Affect (typically observed): Unable to Assess Orientation: : Oriented to Self, Oriented to Place, Oriented to  Time, Oriented to Situation Alcohol  / Substance Use: Not Applicable  Psych Involvement: No (comment)  Admission diagnosis:  Symptomatic anemia [D64.9] Patient Active Problem List   Diagnosis Date Noted   Acquired thrombophilia (HCC) 06/03/2024   Hematemesis 06/03/2024   Protein-calorie malnutrition, severe 04/30/2024   Stage 4 pressure ulcer (HCC) 04/29/2024   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) 04/29/2024   Medication management 04/28/2024   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   Anemia 04/15/2024   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   Pressure injury of sacral region, stage 4 (HCC) 04/15/2024   Osteomyelitis of sacrum (HCC) 04/15/2024   Acute diarrhea 04/15/2024   Acute on chronic anemia 04/14/2024   Paroxysmal atrial fibrillation (HCC) 04/14/2024   Chronic kidney disease, stage 3a (HCC) 04/14/2024   Hyperlipidemia associated with type 2 diabetes mellitus (HCC) 04/14/2024   Severe sepsis (HCC) 02/12/2024   Pneumonia 02/12/2024   Transaminitis 02/12/2024   Atrial fibrillation with RVR (HCC) 02/12/2024   Metabolic acidosis 02/12/2024   Port-A-Cath in place 05/02/2023   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   Coronary artery disease involving native heart without angina pectoris 04/05/2023   Jaundice 04/04/2023   Cholelithiasis 10/12/2022   Symptomatic cholelithiasis 10/12/2022   Family history of malignant neoplasm of digestive organ 02/17/2022   Gangrenous disorder (HCC) 02/17/2022   Gastro-esophageal reflux disease without esophagitis 02/17/2022   Hyperglycemia due to type 2 diabetes mellitus (HCC) 02/17/2022   Iron  deficiency anemia 02/17/2022   Leukocytosis 02/17/2022   Moderate nonproliferative diabetic retinopathy of both eyes without macular edema associated with type 2 diabetes mellitus (HCC) 02/17/2022   Other acute osteomyelitis, left ankle and foot (HCC) 02/17/2022   Pure hypercholesterolemia 02/17/2022   Skin ulcer of sacrum, limited to breakdown of skin (HCC) 02/17/2022   Thrombocytopenic disorder (HCC) 02/17/2022   Tinnitus 02/17/2022   S/P angiogram of extremity 10/13/2021   Osteomyelitis of foot, left, acute (HCC) 04/27/2021   Cellulitis in diabetic foot (HCC) 04/27/2021   AKI (acute kidney injury) (HCC) 04/27/2021   Hyponatremia 04/27/2021   Symptomatic anemia 04/27/2021   Cellulitis of left lower extremity     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   Critical lower limb ischemia (HCC) 08/03/2019   Encounter for immunization 07/09/2016   Essential hypertension 03/31/2013   Dyslipidemia 03/31/2013   Type 2 diabetes mellitus with stage 3a chronic kidney disease, without long-term current use of insulin  (HCC) 03/31/2013   Overweight (BMI 25.0-29.9) 03/31/2013   Carotid artery disease (HCC) 03/31/2013   PAD (peripheral artery disease) (HCC) 03/31/2013   PCP:  Chrystal Lamarr RAMAN, MD Pharmacy:   [facility] [address] [place] - [address] [place] [address] Phone: [phone] Fax: [phone]  [facility] [address] [place] [address] Phone: [phone] Fax: [phone]     Social Drivers of Health (SDOH) Social History: SDOH Screenings   Food Insecurity: No Food Insecurity (06/02/2024)  Housing: Low Risk  (06/02/2024)  Transportation Needs: No Transportation Needs (06/02/2024)  Utilities: Not At Risk (06/02/2024)  Depression (PHQ2-9): Low Risk  (05/18/2024)  Social Connections: Moderately Isolated (06/02/2024)  Tobacco Use: Medium Risk (06/04/2024)   SDOH Interventions:     Readmission Risk Interventions    06/04/2024    3:35 PM 04/16/2024    3:13 PM  Readmission Risk Prevention Plan  Transportation Screening Complete Complete  PCP or Specialist Appt  within 3-5 Days  Complete  HRI or Home Care Consult  Complete  Social Work Consult for Recovery Care Planning/Counseling  Complete  Medication Review Oceanographer) Complete Referral to Pharmacy  PCP or Specialist appointment within 3-5 days of discharge Complete   HRI or Home Care Consult Complete   SW Recovery Care/Counseling Consult Complete   Palliative Care Screening Not Applicable   Skilled Nursing Facility Complete

## 2024-06-04 NOTE — Anesthesia Preprocedure Evaluation (Addendum)
 Anesthesia Evaluation  Patient identified by MRN, date of birth, ID band Patient awake    Reviewed: Allergy & Precautions, NPO status , Patient's Chart, lab work & pertinent test results, reviewed documented beta blocker date and time   History of Anesthesia Complications (+) history of anesthetic complications  Airway Mallampati: II  TM Distance: >3 FB Neck ROM: Limited    Dental  (+) Caps, Dental Advisory Given   Pulmonary pneumonia, resolved, former smoker   Pulmonary exam normal breath sounds clear to auscultation       Cardiovascular Exercise Tolerance: Poor hypertension, Pt. on home beta blockers + CAD, + Past MI, + CABG and + Peripheral Vascular Disease  + dysrhythmias Atrial Fibrillation + Valvular Problems/Murmurs  Rhythm:Regular Rate:Normal + Peripheral Edema Echo 01/2024 1. Left ventricular ejection fraction, by estimation, is 55 to 60%. The  left ventricle has normal function. The left ventricle has no regional  wall motion abnormalities. Left ventricular diastolic parameters were  normal.   2. Right ventricular systolic function is mildly reduced. The right  ventricular size is mildly enlarged.   3. The mitral valve is grossly normal. Trivial mitral valve  regurgitation. No evidence of mitral stenosis.   4. The aortic valve is normal in structure. There is moderate  calcification of the aortic valve. There is moderate thickening of the  aortic valve. Aortic valve regurgitation is not visualized. No aortic  stenosis is present.   5. The inferior vena cava is normal in size with greater than 50%  respiratory variability, suggesting right atrial pressure of 3 mmHg.     CABG 6/21  S/P angioplasty with stent Lt SFA of prox segment.  PTCAs with drug coated balloon Lt ant tibial artery and Lt popliteal artery   Echo 03/23/20  1. Left ventricular ejection fraction, by estimation, is 60 to 65%. The  left ventricle has  normal function. The left ventricle has no regional  wall motion abnormalities. Left ventricular diastolic parameters are  consistent with Grade I diastolic  dysfunction (impaired relaxation).   2. Right ventricular systolic function is normal. The right ventricular  size is normal. There is normal pulmonary artery systolic pressure.   3. Left atrial size was mildly dilated.   4. The mitral valve is normal in structure. Trivial mitral valve  regurgitation. No evidence of mitral stenosis.   5. The aortic valve is normal in structure. Aortic valve regurgitation is  not visualized. No aortic stenosis is present.   6. The inferior vena cava is normal in size with greater than 50%  respiratory variability, suggesting right atrial pressure of 3 mmHg.   EKG 12.21/23 NSR, LAFB, LAD  Cardiac Cath 03/02/20  Ost LM to Mid LM lesion is 45% stenosed.  Prox LAD to Mid LAD lesion is 90% stenosed.  Mid LAD lesion is 75% stenosed.  Dist LAD lesion is 70% stenosed.  Ost Cx to Prox Cx lesion is 95% stenosed.  Prox Cx to Mid Cx lesion is 99% stenosed.  2nd Mrg lesion is 85% stenosed.  1st Mrg lesion is 95% stenosed.  Ost RCA lesion is 50% stenosed.  Mid RCA to Dist RCA lesion is 90% stenosed.  RPDA lesion is 70% stenosed.  The left ventricular systolic function is normal.  LV end diastolic pressure is mildly elevated.  The left ventricular ejection fraction is 55-65% by visual estimate.   1. Severe 3 vessel obstructive CAD. 2. Normal LV function 3. Elevated LVEDP 23 mm Hg     Neuro/Psych  Diabetic peripheral neuropathy  Neuromuscular disease  negative psych ROS   GI/Hepatic hiatal hernia,GERD  Medicated,,Cholangiocarcinoma of liver and GB   Endo/Other  diabetes, Poorly Controlled, Type 2, Insulin  Dependent, Oral Hypoglycemic Agents  Hyperlipidemia   Renal/GU Renal InsufficiencyRenal disease     Musculoskeletal  (+) Arthritis , Osteoarthritis,    Abdominal   Peds   Hematology  (+) Blood dyscrasia, anemia Xarelto  therapy- last dose 10/16   Anesthesia Other Findings   Reproductive/Obstetrics                              Anesthesia Physical Anesthesia Plan  ASA: 4  Anesthesia Plan: MAC   Post-op Pain Management: Minimal or no pain anticipated   Induction: Intravenous  PONV Risk Score and Plan: 4 or greater and Treatment may vary due to age or medical condition, Ondansetron  and Dexamethasone   Airway Management Planned: Natural Airway  Additional Equipment: None  Intra-op Plan:   Post-operative Plan:   Informed Consent: I have reviewed the patients History and Physical, chart, labs and discussed the procedure including the risks, benefits and alternatives for the proposed anesthesia with the patient or authorized representative who has indicated his/her understanding and acceptance.     Dental advisory given  Plan Discussed with: CRNA  Anesthesia Plan Comments:          Anesthesia Quick Evaluation

## 2024-06-05 ENCOUNTER — Encounter (HOSPITAL_COMMUNITY): Payer: Self-pay | Admitting: Gastroenterology

## 2024-06-05 DIAGNOSIS — I480 Paroxysmal atrial fibrillation: Secondary | ICD-10-CM | POA: Diagnosis not present

## 2024-06-05 DIAGNOSIS — C221 Intrahepatic bile duct carcinoma: Secondary | ICD-10-CM | POA: Diagnosis not present

## 2024-06-05 DIAGNOSIS — D649 Anemia, unspecified: Secondary | ICD-10-CM | POA: Diagnosis not present

## 2024-06-05 DIAGNOSIS — K920 Hematemesis: Secondary | ICD-10-CM | POA: Diagnosis not present

## 2024-06-05 DIAGNOSIS — C23 Malignant neoplasm of gallbladder: Secondary | ICD-10-CM | POA: Diagnosis not present

## 2024-06-05 LAB — CBC
HCT: 34.2 % — ABNORMAL LOW (ref 36.0–46.0)
Hemoglobin: 11 g/dL — ABNORMAL LOW (ref 12.0–15.0)
MCH: 28.1 pg (ref 26.0–34.0)
MCHC: 32.2 g/dL (ref 30.0–36.0)
MCV: 87.5 fL (ref 80.0–100.0)
Platelets: 159 K/uL (ref 150–400)
RBC: 3.91 MIL/uL (ref 3.87–5.11)
RDW: 21.9 % — ABNORMAL HIGH (ref 11.5–15.5)
WBC: 13.9 K/uL — ABNORMAL HIGH (ref 4.0–10.5)
nRBC: 0.6 % — ABNORMAL HIGH (ref 0.0–0.2)

## 2024-06-05 LAB — BASIC METABOLIC PANEL WITH GFR
Anion gap: 7 (ref 5–15)
BUN: 34 mg/dL — ABNORMAL HIGH (ref 8–23)
CO2: 22 mmol/L (ref 22–32)
Calcium: 8.4 mg/dL — ABNORMAL LOW (ref 8.9–10.3)
Chloride: 110 mmol/L (ref 98–111)
Creatinine, Ser: 1.08 mg/dL — ABNORMAL HIGH (ref 0.44–1.00)
GFR, Estimated: 51 mL/min — ABNORMAL LOW (ref >60)
Glucose, Bld: 107 mg/dL — ABNORMAL HIGH (ref 70–99)
Potassium: 3.6 mmol/L (ref 3.5–5.1)
Sodium: 139 mmol/L (ref 135–145)

## 2024-06-05 LAB — GLUCOSE, CAPILLARY
Glucose-Capillary: 116 mg/dL — ABNORMAL HIGH (ref 70–99)
Glucose-Capillary: 85 mg/dL (ref 70–99)
Glucose-Capillary: 89 mg/dL (ref 70–99)
Glucose-Capillary: 111 mg/dL — ABNORMAL HIGH (ref 70–99)
Glucose-Capillary: 157 mg/dL — ABNORMAL HIGH (ref 70–99)

## 2024-06-05 LAB — SURGICAL PATHOLOGY

## 2024-06-05 MED ORDER — ROSUVASTATIN CALCIUM 20 MG PO TABS
40.0000 mg | ORAL_TABLET | Freq: Every day | ORAL | Status: DC
  Administered 2024-06-05 – 2024-06-08 (×4): 40 mg via ORAL
  Filled 2024-06-05 (×4): qty 2

## 2024-06-05 MED ORDER — INSULIN ASPART 100 UNIT/ML IJ SOLN
0.0000 [IU] | Freq: Three times a day (TID) | INTRAMUSCULAR | Status: DC
  Administered 2024-06-06: 3 [IU] via SUBCUTANEOUS
  Administered 2024-06-06: 5 [IU] via SUBCUTANEOUS
  Administered 2024-06-06: 11 [IU] via SUBCUTANEOUS
  Administered 2024-06-07 – 2024-06-08 (×6): 3 [IU] via SUBCUTANEOUS
  Administered 2024-06-09: 5 [IU] via SUBCUTANEOUS
  Administered 2024-06-09: 3 [IU] via SUBCUTANEOUS

## 2024-06-05 MED ORDER — MIRTAZAPINE 15 MG PO TABS
15.0000 mg | ORAL_TABLET | Freq: Every day | ORAL | Status: DC
  Administered 2024-06-05 – 2024-06-08 (×4): 15 mg via ORAL
  Filled 2024-06-05 (×4): qty 1

## 2024-06-05 NOTE — Progress Notes (Signed)
 Subjective: Patient states she is doing well.  She has been tolerating full liquids.  Has had a few episodes of black liquid stool.  Objective: Vital signs in last 24 hours: Temp:  [97 F (36.1 C)-98.5 F (36.9 C)] 97.7 F (36.5 C) (09/12 0621) Pulse Rate:  [80-103] 103 (09/12 0800) Resp:  [0-17] 12 (09/12 0800) BP: (84-124)/(25-74) 117/46 (09/12 0800) SpO2:  [56 %-100 %] 97 % (09/12 0800) Weight:  [63.6 kg] 63.6 kg (09/11 1255) Weight change: 0 kg Last BM Date : 06/04/24  PE: Ill-appearing, not in distress GENERAL: Prominent  pallor  ABDOMEN: Nondistended, normal active bowel sounds EXTREMITIES:Edematous upper and lower extremities   Lab Results: Results for orders placed or performed during the hospital encounter of 06/02/24 (from the past 48 hours)  Glucose, capillary     Status: None   Collection Time: 06/03/24  3:16 PM  Result Value Ref Range   Glucose-Capillary 78 70 - 99 mg/dL    Comment: Glucose reference range applies only to samples taken after fasting for at least 8 hours.  CBC     Status: Abnormal   Collection Time: 06/03/24  4:26 PM  Result Value Ref Range   WBC 13.1 (H) 4.0 - 10.5 K/uL   RBC 4.29 3.87 - 5.11 MIL/uL   Hemoglobin 11.9 (L) 12.0 - 15.0 g/dL    Comment: REPEATED TO VERIFY POST TRANSFUSION SPECIMEN    HCT 36.4 36.0 - 46.0 %   MCV 84.8 80.0 - 100.0 fL   MCH 27.7 26.0 - 34.0 pg   MCHC 32.7 30.0 - 36.0 g/dL   RDW 79.1 (H) 88.4 - 84.4 %   Platelets 194 150 - 400 K/uL   nRBC 0.7 (H) 0.0 - 0.2 %    Comment: Performed at [facility], [address], [place], [place] [address]  Glucose, capillary     Status: Abnormal   Collection Time: 06/03/24  7:24 PM  Result Value Ref Range   Glucose-Capillary 157 (H) 70 - 99 mg/dL    Comment: Glucose reference range applies only to samples taken after fasting for at least 8 hours.  Glucose, capillary     Status: Abnormal   Collection Time: 06/03/24 11:32 PM  Result Value Ref Range    Glucose-Capillary 186 (H) 70 - 99 mg/dL    Comment: Glucose reference range applies only to samples taken after fasting for at least 8 hours.  CBC     Status: Abnormal   Collection Time: 06/04/24  3:42 AM  Result Value Ref Range   WBC 12.4 (H) 4.0 - 10.5 K/uL   RBC 3.72 (L) 3.87 - 5.11 MIL/uL   Hemoglobin 10.1 (L) 12.0 - 15.0 g/dL   HCT 67.7 (L) 63.9 - 53.9 %   MCV 86.6 80.0 - 100.0 fL   MCH 27.2 26.0 - 34.0 pg   MCHC 31.4 30.0 - 36.0 g/dL   RDW 78.7 (H) 88.4 - 84.4 %   Platelets 159 150 - 400 K/uL   nRBC 0.6 (H) 0.0 - 0.2 %    Comment: Performed at [facility], [address] [place], [place] [address]  Basic metabolic panel     Status: Abnormal   Collection Time: 06/04/24  3:42 AM  Result Value Ref Range   Sodium 142 135 - 145 mmol/L   Potassium 3.5 3.5 - 5.1 mmol/L   Chloride 110 98 - 111 mmol/L   CO2 22 22 - 32 mmol/L   Glucose, Bld 160 (  H) 70 - 99 mg/dL    Comment: Glucose reference range applies only to samples taken after fasting for at least 8 hours.   BUN 38 (H) 8 - 23 mg/dL   Creatinine, Ser 8.76 (H) 0.44 - 1.00 mg/dL   Calcium  8.6 (L) 8.9 - 10.3 mg/dL   GFR, Estimated 44 (L) >60 mL/min    Comment: (NOTE) Calculated using the CKD-EPI Creatinine Equation (2021)    Anion gap 10 5 - 15    Comment: Performed at [facility], [address], [place], [place] [address]  Magnesium      Status: None   Collection Time: 06/04/24  3:42 AM  Result Value Ref Range   Magnesium  2.0 1.7 - 2.4 mg/dL    Comment: Performed at [facility], [address], [place], [place] [address]  Glucose, capillary     Status: Abnormal   Collection Time: 06/04/24  4:03 AM  Result Value Ref Range   Glucose-Capillary 151 (H) 70 - 99 mg/dL    Comment: Glucose reference range applies only to samples taken after fasting for at least 8 hours.  Glucose, capillary     Status: Abnormal   Collection Time: 06/04/24  7:58 AM  Result Value Ref Range    Glucose-Capillary 124 (H) 70 - 99 mg/dL    Comment: Glucose reference range applies only to samples taken after fasting for at least 8 hours.  CBC     Status: Abnormal   Collection Time: 06/04/24 10:34 AM  Result Value Ref Range   WBC 14.0 (H) 4.0 - 10.5 K/uL   RBC 3.85 (L) 3.87 - 5.11 MIL/uL   Hemoglobin 10.6 (L) 12.0 - 15.0 g/dL   HCT 66.8 (L) 63.9 - 53.9 %   MCV 86.0 80.0 - 100.0 fL   MCH 27.5 26.0 - 34.0 pg   MCHC 32.0 30.0 - 36.0 g/dL   RDW 78.6 (H) 88.4 - 84.4 %   Platelets 170 150 - 400 K/uL   nRBC 0.5 (H) 0.0 - 0.2 %    Comment: Performed at [facility], [address], [place], [place] [address]  Glucose, capillary     Status: None   Collection Time: 06/04/24 11:27 AM  Result Value Ref Range   Glucose-Capillary 97 70 - 99 mg/dL    Comment: Glucose reference range applies only to samples taken after fasting for at least 8 hours.  Surgical pathology     Status: None   Collection Time: 06/04/24  1:49 PM  Result Value Ref Range   SURGICAL PATHOLOGY      SURGICAL PATHOLOGY CASE: [phone] PATIENT: TANDY MONTCLAIR Surgical Pathology Report     Clinical History: hematemesis (tb)     FINAL MICROSCOPIC DIAGNOSIS:  A. STOMACH, BIOPSY: Reactive gastropathy with mild chronic gastritis Focal hemosiderin within lamina propria compatible with prior microhemorrhage Negative for H. pylori, intestinal metaplasia, dysplasia and carcinoma   GROSS DESCRIPTION:  Received in formalin is a tan, soft tissue fragment that is submitted in toto.  Size: 0.2 x 0.2 x 0.1 cm, 1 block submitted. (KW, 06/04/2024)    Final Diagnosis performed by Prentice Pitcher, MD.   Electronically signed 06/05/2024 Technical component performed at [facility], [address] [place], [place] [address].  Professional component performed at [employer]. [facility], [address] [place], [place] [address].  Immunohistochemistry Technical component  (if applicable) was performed at [facility]. [address], [place],  [place] [address].   IMMUNOHISTOCHEMISTRY DISCLAIMER (if applicable): Some of these immunohistochemical stains may have been developed and the performance characteristics determine by [facility]. Some may not have been cleared or approved by the U.S. Food and Drug Administration. The FDA has determined that such clearance or approval is not necessary. This test is used for clinical purposes. It should not be regarded as investigational or for research. This laboratory is certified under the Clinical Laboratory Improvement Amendments of 1988 (CLIA-88) as qualified to perform high complexity clinical laboratory testing.  The controls stained appropriately.   IHC stains are performed on formalin fixed, paraffin embedded tissue using a 3,3diaminobenzidine (DAB) chromogen and Leica Bond Autostainer System. The staining intensity of the nucleus is score manually and is reported as the percentage of tumor cell nuclei demonstrating speci fic nuclear staining. The specimens are fixed in 10% Neutral Formalin for at least 6 hours and up to 72hrs. These tests are validated on decalcified tissue. Results should be interpreted with caution given the possibility of false negative results on decalcified specimens. Antibody Clones are as follows ER-clone [id number], PR-clone 16, Ki67- clone MM1. Some of these immunohistochemical stains may have been developed and the performance characteristics determined by [facility].   Glucose, capillary     Status: None   Collection Time: 06/04/24  3:51 PM  Result Value Ref Range   Glucose-Capillary 99 70 - 99 mg/dL    Comment: Glucose reference range applies only to samples taken after fasting for at least 8 hours.  Glucose, capillary     Status: Abnormal   Collection Time: 06/04/24  7:26 PM  Result Value Ref Range   Glucose-Capillary 153 (H) 70 -  99 mg/dL    Comment: Glucose reference range applies only to samples taken after fasting for at least 8 hours.  Glucose, capillary     Status: Abnormal   Collection Time: 06/04/24 11:24 PM  Result Value Ref Range   Glucose-Capillary 119 (H) 70 - 99 mg/dL    Comment: Glucose reference range applies only to samples taken after fasting for at least 8 hours.  Glucose, capillary     Status: Abnormal   Collection Time: 06/05/24  3:07 AM  Result Value Ref Range   Glucose-Capillary 116 (H) 70 - 99 mg/dL    Comment: Glucose reference range applies only to samples taken after fasting for at least 8 hours.  CBC     Status: Abnormal   Collection Time: 06/05/24  4:55 AM  Result Value Ref Range   WBC 13.9 (H) 4.0 - 10.5 K/uL   RBC 3.91 3.87 - 5.11 MIL/uL   Hemoglobin 11.0 (L) 12.0 - 15.0 g/dL   HCT 65.7 (L) 63.9 - 53.9 %   MCV 87.5 80.0 - 100.0 fL   MCH 28.1 26.0 - 34.0 pg   MCHC 32.2 30.0 - 36.0 g/dL   RDW 78.0 (H) 88.4 - 84.4 %   Platelets 159 150 - 400 K/uL   nRBC 0.6 (H) 0.0 - 0.2 %    Comment: Performed at [facility], [address], [place], [place] [address]  Basic metabolic panel     Status: Abnormal   Collection Time: 06/05/24  4:55 AM  Result Value Ref Range   Sodium 139 135 - 145 mmol/L   Potassium 3.6 3.5 - 5.1 mmol/L   Chloride 110 98 - 111 mmol/L   CO2 22 22 - 32 mmol/L   Glucose, Bld 107 (H) 70 - 99 mg/dL  Comment: Glucose reference range applies only to samples taken after fasting for at least 8 hours.   BUN 34 (H) 8 - 23 mg/dL   Creatinine, Ser 8.91 (H) 0.44 - 1.00 mg/dL   Calcium  8.4 (L) 8.9 - 10.3 mg/dL   GFR, Estimated 51 (L) >60 mL/min    Comment: (NOTE) Calculated using the CKD-EPI Creatinine Equation (2021)    Anion gap 7 5 - 15    Comment: Performed at [facility], [address], [place], [place] [address]  Glucose, capillary     Status: None   Collection Time: 06/05/24  7:50 AM  Result Value Ref Range    Glucose-Capillary 85 70 - 99 mg/dL    Comment: Glucose reference range applies only to samples taken after fasting for at least 8 hours.  Glucose, capillary     Status: None   Collection Time: 06/05/24 11:17 AM  Result Value Ref Range   Glucose-Capillary 89 70 - 99 mg/dL    Comment: Glucose reference range applies only to samples taken after fasting for at least 8 hours.   *Note: Due to a large number of results and/or encounters for the requested time period, some results have not been displayed. A complete set of results can be found in Results Review.    Studies/Results: No results found.  Medications: I have reviewed the patient's current medications.  Assessment: Diffuse gastritis with mucosal ulceration noted on EGD yesterday which caused hematemesis and melena and anemia  BUN trending down Hemodynamically stable Hb stable 11 today Biopsy showed reactive gastropathy with mild chronic gastritis, no H. pylori intestinal metaplasia or dysplasia carcinoma   Plan: I will advance her diet to regular. Continue pantoprazole  40 mg twice a day indefinitely. Continue sucralfate  1 g 4 times a day for 2 weeks.  Discussed with Dr. Lanny, patient to avoid Xarelto  anticoagulation due to recurrent GI bleed and anemia.  I will sign off, please recall if needed.  [name], MD 06/05/2024, 12:18 PM

## 2024-06-05 NOTE — Progress Notes (Signed)
 Progress Note   Patient: [name] FMW:[id number] DOB: 02-24-1941 DOA: 06/02/2024     3 DOS: the patient was seen and examined on 06/05/2024   Brief hospital course: 83 year old woman PMH including gallbladder cancer, chronic anemia requiring multiple transfusions, presented with hemoglobin of 6.7, had 2 episodes of hematemesis.  Admitted for further evaluation.  Transfused multiple units of blood.  Plan for EGD.  Consultants GI  Procedures/Events 9/11 EGD: Normal esophagus, clotted blood in gastric body, nonbleeding gastric ulcers biopsied, erythematous mucosa stomach.  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 Hemoglobin remained stable today status post EGD which showed diffuse mucosal ulceration and severe diffuse gastritis which could be related to chemotherapy, radiation or immunotherapy.  Biopsies pending.  Started on Carafate , continue Protonix  40 mg twice daily indefinitely.  Hold anticoagulation, if needs to restart, consider apixaban.   AKI CKD stage II CKD stage III ruled out Baseline creatinine around 1.0.  Last 4 months creatinine 1.65. Creatinine stable, will follow intermittently.   Hypomagnesemia repleted Potassium borderline, repleted   PAF Acquired thrombophilia Currently in sinus rhythm.  Hold Xarelto  given above.  Holding metoprolol  given soft blood pressures.   DM 2-non-insulin -dependent Hold home Jardiance  and metformin  CBG stable.  Continue moderate dose sliding scale   Elevated INR-due to Xarelto , last taken 9/8 Significance unclear, treated with FFP.  Monitor clinically.   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 Lanny   Essential hypertension Asymptomatic hypotension, diastolic No  signs or symptoms of sepsis.  Morning cortisol was appropriate. Hypotension improved, MAP 66.   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  No evidence of ongoing infection.  Left upper extremity redness present for 3 weeks and not suggestive of infective cellulitis.  Multiple abnormalities probably related to anemia and other comorbidities. Monitor off antibiotics. CT findings noted, however there are no signs or symptoms to suggest pneumonia.  This could have been aspiration.  Continue to monitor clinically.   Deep Tissue Pressure Injury R ankle, present on admission (at the time of the admission order)    Goals of care: Patient confirms full code at bedside. Fifth admission in last 4 months  Disposition: Plan to return to [facility] for short-term rehab  Overall improving, transfer to progressive today     Subjective:  Feels fine, no issues overnight Discussed w/ RN Benjiman, stools noted No active bleeding  Physical Exam: Vitals:   06/05/24 0600 06/05/24 0621 06/05/24 0700 06/05/24 0800  BP: (!) 112/35  (!) 104/27 (!) 117/46  Pulse: 92  96 (!) 103  Resp: 12  13 12   Temp:  97.7 F (36.5 C)    TempSrc:  Oral    SpO2: 98%  97% 97%  Weight:      Height:       Physical Exam Vitals and nursing note reviewed.  Constitutional:      General: She is not in acute distress.    Appearance: She is not ill-appearing or toxic-appearing.  Cardiovascular:     Rate and Rhythm: Normal rate and regular rhythm.  Heart sounds: No murmur heard. Pulmonary:     Effort: Pulmonary effort is normal. No respiratory distress.     Breath sounds: No wheezing, rhonchi or rales.  Neurological:     Mental Status: She is alert.  Psychiatric:        Mood and Affect: Mood normal.         Behavior: Behavior normal.     Data Reviewed: CBG stable BMP noted CBC stable hemoglobin  Family Communication: none  Disposition: Status is: Inpatient Remains inpatient appropriate because: bleeding     Time spent: 25 minutes  Author: Toribio Door, MD 06/05/2024 8:42 AM  For on call review [email or web address].

## 2024-06-05 NOTE — Progress Notes (Signed)
 [name]      ASSESSMENT & PLAN:  [name] is an 83 year old female patient with oncologic history significant for gallbladder cancer.  She was admitted on 06/02/2024 with complaints of weakness and hematemesis.  Medical oncology/Dr. Lanny following.  Gallbladder cancer, cT4 N0 M0, stage III - Diagnosed 04/09/2023, moderately differentiated adenocarcinoma - Status post first-line cisplatin , gemcitabine , durvalumab . - Switched to maintenance therapy with Durvalumab  on 11/22/2023.  Treatment has been delayed due to multiple hospital admissions and deconditioning -Seen in outpatient oncology office on 06/01/2024.  Received Durvalumab  and plan is to continue every 4 weeks. - Medical oncology/Dr. Lanny following  Hematemesis Generalized weakness - Admitted on 06/02/2024 with complaints of weakness and hematemesis. -Seen by GI, status post upper endoscopy done 9/11 - Continue to monitor closely  Anemia - Low hemoglobin 5.7 on 9/9.  Status post PRBC transfusions.  Good response to 11.0 today - Recommend PRBC transfusion for hemoglobin <7.0 - No transfusional intervention required at this time - Continue to monitor CBC with differential  Chronic pressure ulcers - Ulcers on her back, decubitus, lower extremity - Continue to monitor and provide supportive care  Code Status Full   Subjective:  Patient seen resting comfortably laying on her left side.  Awakens easily to verbal stimuli.  She remains pleasant and conversational.  She is chronically ill-appearing.  States that she feels very weak.  Denies chest pain or shortness of breath.  No acute events noted overnight.  Objective:   Intake/Output Summary (Last 24 hours) at 06/05/2024 1142 Last data filed at 06/05/2024 9360 Gross per 24 hour  Intake 1121.92 ml  Output --  Net 1121.92 ml     PHYSICAL EXAMINATION: ECOG PERFORMANCE STATUS: 4 - Bedbound  Vitals:   06/05/24 0700 06/05/24 0800  BP:  (!) 104/27 (!) 117/46  Pulse: 96 (!) 103  Resp: 13 12  Temp:    SpO2: 97% 97%   Filed Weights   06/03/24 1103 06/04/24 1255  Weight: 140 lb 3.4 oz (63.6 kg) 140 lb 3.4 oz (63.6 kg)    GENERAL: alert, no distress and comfortable + chronically ill-appearing SKIN: + Pale skin color, chronic pressure ulcers back, right heel, sacral EYES: normal, conjunctiva are pink and non-injected, sclera clear OROPHARYNX: no exudate, no erythema and lips, buccal mucosa, and tongue normal  NECK: supple, thyroid  normal size, non-tender, without nodularity LYMPH: no palpable lymphadenopathy in the cervical, axillary or inguinal LUNGS: clear to auscultation and percussion with normal breathing effort HEART: regular rate & rhythm and no murmurs and no lower extremity edema ABDOMEN: abdomen soft, non-tender and normal bowel sounds MUSCULOSKELETAL: + Right foot partial amputation with dressing intact PSYCH: alert & oriented x 3 with fluent speech NEURO: no focal motor/sensory deficits   All questions were answered. The patient knows to call the clinic with any problems, questions or concerns.   The total time spent in the appointment was 40 minutes encounter with patient including review of chart and various tests results, discussions about plan of care and coordination of care plan  Olam JINNY Brunner, NP 06/05/2024 11:42 AM    Labs Reviewed:  Lab Results  Component Value Date   WBC 13.9 (H) 06/05/2024   HGB 11.0 (L) 06/05/2024   HCT 34.2 (L) 06/05/2024   MCV 87.5 06/05/2024   PLT 159 06/05/2024   Recent Labs    05/18/24 0841 05/22/24 2047 06/01/24 1333 06/02/24 1029 06/03/24 0111 06/03/24 0806 06/04/24 0342 06/05/24  0455  NA 139   < > 139 139   < > 143 142 139  K 4.1   < > 3.7 4.1   < > 3.4* 3.5 3.6  CL 106   < > 106 103   < > 110 110 110  CO2 26   < > 23 11*   < > 21* 22 22  GLUCOSE 172*   < > 222* 183*   < > 98 160* 107*  BUN 35*   < > 41* 41*   < > 40* 38* 34*  CREATININE 1.94*   < >  1.65* 1.76*   < > 1.24* 1.23* 1.08*  CALCIUM  7.9*   < > 7.7* 8.9   < > 8.2* 8.6* 8.4*  GFRNONAA 25*   < > 31* 28*   < > 43* 44* 51*  PROT 4.2*  --  3.8* 3.9*  --   --   --   --   ALBUMIN  2.0*  --  1.9* 1.8*  --   --   --   --   AST 53*  --  18 36  --   --   --   --   ALT 44  --  14 19  --   --   --   --   ALKPHOS 347*  --  162* 175*  --   --   --   --   BILITOT 0.4  --  0.4 0.3  --   --   --   --    < > = values in this interval not displayed.    Studies Reviewed:  CT CHEST ABDOMEN PELVIS WO CONTRAST Result Date: 06/02/2024 CLINICAL DATA:  known biliary cancer. worsening leukocytosis. Eval for abscess. Stent in CBD. * Tracking Code: BO * EXAM: CT CHEST, ABDOMEN AND PELVIS WITHOUT CONTRAST TECHNIQUE: Multidetector CT imaging of the chest, abdomen and pelvis was performed following the standard protocol without IV contrast. RADIATION DOSE REDUCTION: This exam was performed according to the departmental dose-optimization program which includes automated exposure control, adjustment of the mA and/or kV according to patient size and/or use of iterative reconstruction technique. COMPARISON:  CT scan chest, abdomen and pelvis from 04/23/2024. FINDINGS: CT CHEST FINDINGS Cardiovascular: Normal cardiac size. No pericardial effusion. No aortic aneurysm. There are coronary artery calcifications, in keeping with coronary artery disease. There are also moderate to severe peripheral atherosclerotic vascular calcifications of thoracic aorta and its major branches. Mediastinum/Nodes: Visualized thyroid  gland appears grossly unremarkable. No solid / cystic mediastinal masses. There is fluid-filled and dilated esophagus, which is nonspecific but most likely seen in the settings of chronic gastroesophageal reflux disease versus esophageal dysmotility. No mediastinal or axillary lymphadenopathy by size criteria. Evaluation of bilateral hila is limited due to lack on intravenous contrast: however, no large hilar  lymphadenopathy identified. Lungs/Pleura: The central tracheo-bronchial tree is patent. Since the prior study, there are new airspace opacities in the left lung lower lobe, highly concerning for bronchopneumonia. Follow-up to clearing is recommended. No pleural effusion or pneumothorax. No lung mass or collapse. There are multiple scattered sub 4 mm noncalcified nodules in the right lung (marked with electronic arrow sign on series 100), which are unchanged since the prior study. No new suspicious lung nodule seen. Musculoskeletal: The visualized soft tissues of the chest wall are grossly unremarkable. No suspicious osseous lesions. There are mild to moderate multilevel degenerative changes in the visualized spine. CT ABDOMEN PELVIS FINDINGS Hepatobiliary: The liver is normal  in size. Non-cirrhotic configuration. Previously seen hypoattenuating lesion in the segment 4A is not well seen on today's exam due to lack of intravenous contrast. There is an irregular hypoattenuating lesion in the right hepatic lobe, segment 4B (series 2, image 54), which appears decreased in size since the prior study. No other discrete liver lesion seen within the limitations of this unenhanced exam. Contrast-enhanced MRI or CT scan exam is recommended for better evaluation of liver lesions. No intrahepatic bile duct dilation. Left hepatic lobe pneumobilia noted. There is stable positioning of the biliary stent. Gallbladder is surgically absent. Pancreas: Unremarkable. No pancreatic ductal dilatation or surrounding inflammatory changes. Spleen: Within normal limits. No focal lesion. Adrenals/Urinary Tract: Adrenal glands are unremarkable. No suspicious renal mass within the limitations of this unenhanced exam. No nephroureterolithiasis or obstructive uropathy. Bilateral extensive renal vascular calcifications noted. Unremarkable urinary bladder. Stomach/Bowel: No disproportionate dilation of the small or large bowel loops. No evidence of  abnormal bowel wall thickening or inflammatory changes. The appendix was not visualized; however there is no acute inflammatory process in the right lower quadrant. Vascular/Lymphatic: There is mild ascites in the dependent pelvis. No walled-off abscess. No pneumoperitoneum. No abdominal or pelvic lymphadenopathy, by size criteria. No aneurysmal dilation of the major abdominal arteries. There are marked peripheral atherosclerotic vascular calcifications of the aorta and its major branches. Reproductive: The uterus is unremarkable. No large adnexal mass. Other: There is decubitus ulcer overlying the midline/right paramedian gluteal region. Underlying coccyx appears intact. No focal bone erosions. There is moderate anasarca. Musculoskeletal: No suspicious osseous lesions. There are mild - moderate multilevel degenerative changes in the visualized spine. IMPRESSION: 1. There are new airspace opacities in the left lung lower lobe, compatible with pneumonia. Follow-up to clearing is recommended. 2. There is a decubitus ulcer overlying the midline/right paramedian gluteal region. Underlying coccyx appears intact. No focal bone erosions. 3. Mild ascites. No walled-off abscess. No walled-off abscess or pneumoperitoneum. 4. Multiple other nonacute observations, as described above. Aortic Atherosclerosis (ICD10-I70.0). Electronically Signed   By: Ree Molt M.D.   On: 06/02/2024 14:43   DG Chest Portable 1 View Result Date: 06/02/2024 CLINICAL DATA:  Infectious evaluation. EXAM: PORTABLE CHEST 1 VIEW COMPARISON:  05/12/2024. FINDINGS: The heart size and mediastinal contours are unchanged. Prior median sternotomy and CABG. Left chest Port-A-Cath is unchanged. No focal consolidation, pleural effusion, or pneumothorax. No acute osseous abnormality. IMPRESSION: No acute cardiopulmonary findings. Electronically Signed   By: Harrietta Sherry M.D.   On: 06/02/2024 11:42   VAS US  UPPER EXTREMITY VENOUS DUPLEX Result Date:  05/19/2024 UPPER VENOUS STUDY  Patient Name:  HALLEL DENHERDER  Date of Exam:   05/19/2024 Medical Rec #: [id number]      Accession #:    [id number] Date of Birth: 06-20-1941      Patient Gender: F Patient Age:   21 years Exam Location:  [address] Procedure:      VAS US  UPPER EXTREMITY VENOUS DUPLEX Referring Phys: ONITA MATTOCK --------------------------------------------------------------------------------  Indications: Edema Performing Technologist: Duwaine Hives RVS  Examination Guidelines: A complete evaluation includes B-mode imaging, spectral Doppler, color Doppler, and power Doppler as needed of all accessible portions of each vessel. Bilateral testing is considered an integral part of a complete examination. Limited examinations for reoccurring indications may be performed as noted.  Right Findings: +----------+------------+---------+-----------+----------+-------+ RIGHT     CompressiblePhasicitySpontaneousPropertiesSummary +----------+------------+---------+-----------+----------+-------+ IJV           Full       Yes  Yes                      +----------+------------+---------+-----------+----------+-------+ Subclavian    Full       Yes       Yes                      +----------+------------+---------+-----------+----------+-------+ Axillary      Full       Yes       Yes                      +----------+------------+---------+-----------+----------+-------+ Brachial      Full       Yes       Yes                      +----------+------------+---------+-----------+----------+-------+ Radial        Full                                          +----------+------------+---------+-----------+----------+-------+ Ulnar         Full                                          +----------+------------+---------+-----------+----------+-------+ Cephalic      Full                                           +----------+------------+---------+-----------+----------+-------+ Basilic       Full                                          +----------+------------+---------+-----------+----------+-------+  Left Findings: +----------+------------+---------+-----------+----------+-------+ LEFT      CompressiblePhasicitySpontaneousPropertiesSummary +----------+------------+---------+-----------+----------+-------+ Subclavian    Full       Yes       Yes                      +----------+------------+---------+-----------+----------+-------+  Summary:  Right: No evidence of deep vein thrombosis in the upper extremity. No evidence of superficial vein thrombosis in the upper extremity.  Left: No evidence of thrombosis in the subclavian.  *See table(s) above for measurements and observations.  Diagnosing physician: Lonni Gaskins MD Electronically signed by Lonni Gaskins MD on 05/19/2024 at 1:47:31 PM.    Final    DG Chest Portable 1 View Result Date: 05/12/2024 EXAM: 1 VIEW XRAY OF THE CHEST 05/12/2024 08:15:00 AM COMPARISON: AP radiograph chest dated 08/60/2025. CLINICAL HISTORY: Weakness, low blood sugar. FINDINGS: LUNGS AND PLEURA: No focal pulmonary opacity. No pulmonary edema. No pleural effusion. No pneumothorax. HEART AND MEDIASTINUM: No acute abnormality of the cardiac and mediastinal silhouettes. The patient is status post CABG. BONES AND SOFT TISSUES: No acute osseous abnormality. There is a left subclavian chest port present. There is also right arm PICC. IMPRESSION: 1. No acute process. Electronically signed by: Evalene Coho MD 05/12/2024 08:19 AM EDT RP Workstation: [id number]

## 2024-06-06 DIAGNOSIS — D649 Anemia, unspecified: Secondary | ICD-10-CM | POA: Diagnosis not present

## 2024-06-06 DIAGNOSIS — I480 Paroxysmal atrial fibrillation: Secondary | ICD-10-CM | POA: Diagnosis not present

## 2024-06-06 DIAGNOSIS — C23 Malignant neoplasm of gallbladder: Secondary | ICD-10-CM | POA: Diagnosis not present

## 2024-06-06 DIAGNOSIS — D6869 Other thrombophilia: Secondary | ICD-10-CM | POA: Diagnosis not present

## 2024-06-06 LAB — GLUCOSE, CAPILLARY
Glucose-Capillary: 215 mg/dL — ABNORMAL HIGH (ref 70–99)
Glucose-Capillary: 189 mg/dL — ABNORMAL HIGH (ref 70–99)
Glucose-Capillary: 309 mg/dL — ABNORMAL HIGH (ref 70–99)
Glucose-Capillary: 179 mg/dL — ABNORMAL HIGH (ref 70–99)

## 2024-06-06 MED ORDER — SODIUM CHLORIDE 0.9% FLUSH: 10.0000 mL | INTRAVENOUS | Status: DC | PRN

## 2024-06-06 MED ORDER — METOPROLOL TARTRATE 25 MG PO TABS
12.5000 mg | ORAL_TABLET | Freq: Two times a day (BID) | ORAL | Status: DC
  Administered 2024-06-06 – 2024-06-09 (×6): 12.5 mg via ORAL
  Filled 2024-06-06 (×6): qty 1

## 2024-06-06 NOTE — Plan of Care (Signed)

## 2024-06-06 NOTE — Progress Notes (Signed)
 Progress Note   Patient: [name] FMW:[id number] DOB: 01-Mar-1941 DOA: 06/02/2024     4 DOS: the patient was seen and examined on 06/06/2024   Brief hospital course: 83 year old woman PMH including gallbladder cancer, chronic anemia requiring multiple transfusions, presented with hemoglobin of 6.7, had 2 episodes of hematemesis.  Admitted for further evaluation.  Transfused multiple units of blood.  Underwent EGD as below.  Improving.  Likely transition back to SNF to 3 days  Consultants GI  Procedures/Events 9/11 EGD: Normal esophagus, clotted blood in gastric body, nonbleeding gastric ulcers biopsied, erythematous mucosa stomach.  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  Biopsies pending.   Started on Carafate , continue Protonix  40 mg twice daily indefinitely.  Hold anticoagulation, if needs to restart, consider apixaban.   AKI CKD stage II CKD stage III ruled out Baseline creatinine around 1.0.  Last 4 months creatinine 1.65. Creatinine stable, will follow intermittently.   Hypomagnesemia repleted Potassium borderline, repleted   PAF Acquired thrombophilia Currently in sinus rhythm.  Hold Xarelto  given above.  Resume metoprolol  now that blood pressure is improved.   DM 2-non-insulin -dependent Hold home Jardiance  and metformin  CBG overall stable.  Continue moderate dose sliding scale   Elevated INR-due to Xarelto , last taken 9/8 Significance unclear, treated with FFP.  Monitor clinically.   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 Lanny   Essential hypertension Asymptomatic hypotension,  diastolic No signs or symptoms of sepsis.  Morning cortisol was appropriate. Hypotension resolved.   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  No evidence of ongoing infection.  Left upper extremity redness present for 3 weeks and not suggestive of infective cellulitis.  Multiple abnormalities probably related to anemia and other comorbidities. Monitor off antibiotics. CT findings noted, however there are no signs or symptoms to suggest pneumonia.  This could have been aspiration.  Continue to monitor clinically.   Deep Tissue Pressure Injury R ankle, present on admission (at the time of the admission order)    Goals of care: Patient confirms full code at bedside. Fifth admission in last 4 months   Disposition: Plan to return to [facility] and rehab for short-term rehab       Subjective:  Feels fine  Physical Exam: Vitals:   06/05/24 1618 06/05/24 2018 06/06/24 0615 06/06/24 1300  BP: (!) 110/47 134/62 114/60 117/70  Pulse:  (!) 107 (!) 107 97  Resp: 19 19 19    Temp: 98 F (36.7 C) 97.7 F (36.5 C) 97.7 F (36.5 C) 97.7 F (36.5 C)  TempSrc: Oral Oral Oral Oral  SpO2:  99% 99% 99%  Weight:      Height:       Physical Exam Vitals reviewed.  Constitutional:      General: She is not in acute distress.    Appearance: She is not ill-appearing or toxic-appearing.  Cardiovascular:     Rate and Rhythm: Normal rate and regular rhythm.     Heart sounds: No murmur heard. Pulmonary:  Effort: Pulmonary effort is normal. No respiratory distress.     Breath sounds: No wheezing, rhonchi or rales.  Neurological:     Mental Status: She is alert.  Psychiatric:        Mood and Affect: Mood normal.        Behavior: Behavior normal.     Data Reviewed: CBG up to  309  Family Communication: none  Disposition: Status is: Inpatient Remains inpatient appropriate because: return to [facility]     Time spent: 20 minutes  Author: Toribio Door, MD 06/06/2024 5:36 PM  For on call review [email or web address].

## 2024-06-06 NOTE — Plan of Care (Signed)
  Problem: Education: Goal: Knowledge of General Education information will improve Description: Including pain rating scale, medication(s)/side effects and non-pharmacologic comfort measures Outcome: Progressing   Problem: Skin Integrity: Goal: Risk for impaired skin integrity will decrease Outcome: Progressing   Problem: Coping: Goal: Ability to adjust to condition or change in health will improve Outcome: Progressing

## 2024-06-07 DIAGNOSIS — D6869 Other thrombophilia: Secondary | ICD-10-CM | POA: Diagnosis not present

## 2024-06-07 DIAGNOSIS — D649 Anemia, unspecified: Secondary | ICD-10-CM | POA: Diagnosis not present

## 2024-06-07 DIAGNOSIS — I480 Paroxysmal atrial fibrillation: Secondary | ICD-10-CM | POA: Diagnosis not present

## 2024-06-07 DIAGNOSIS — C23 Malignant neoplasm of gallbladder: Secondary | ICD-10-CM | POA: Diagnosis not present

## 2024-06-07 LAB — GLUCOSE, CAPILLARY
Glucose-Capillary: 164 mg/dL — ABNORMAL HIGH (ref 70–99)
Glucose-Capillary: 173 mg/dL — ABNORMAL HIGH (ref 70–99)
Glucose-Capillary: 174 mg/dL — ABNORMAL HIGH (ref 70–99)
Glucose-Capillary: 177 mg/dL — ABNORMAL HIGH (ref 70–99)
Glucose-Capillary: 177 mg/dL — ABNORMAL HIGH (ref 70–99)
Glucose-Capillary: 183 mg/dL — ABNORMAL HIGH (ref 70–99)
Glucose-Capillary: 256 mg/dL — ABNORMAL HIGH (ref 70–99)

## 2024-06-07 LAB — CULTURE, BLOOD (ROUTINE X 2)
Culture: NO GROWTH
Culture: NO GROWTH
Special Requests: ADEQUATE
Special Requests: ADEQUATE

## 2024-06-07 NOTE — Progress Notes (Signed)
 Progress Note   Patient: [name] FMW:[id number] DOB: Jun 10, 1941 DOA: 06/02/2024     5 DOS: the patient was seen and examined on 06/07/2024   Brief hospital course: 83 year old woman PMH including gallbladder cancer, chronic anemia requiring multiple transfusions, presented with hemoglobin of 6.7, had 2 episodes of hematemesis.  Admitted for further evaluation.  Transfused multiple units of blood.  Underwent EGD as below.  Improving.  Likely transition back to SNF to 3 days  Consultants GI  Procedures/Events 9/11 EGD: Normal esophagus, clotted blood in gastric body, nonbleeding gastric ulcers biopsied, erythematous mucosa stomach.  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  Biopsies pending.   Started on Carafate , continue Protonix  40 mg twice daily indefinitely.  Hold anticoagulation, if needs to restart, consider apixaban.   AKI CKD stage II CKD stage III ruled out Baseline creatinine around 1.0.  Peak creatinine 1.76. Creatinine stable, will follow intermittently.   Hypomagnesemia repleted Potassium borderline, repleted   PAF Acquired thrombophilia Currently in sinus rhythm.  Hold Xarelto  given above.  Continue metoprolol     DM 2-non-insulin -dependent Hold home Jardiance  and metformin  CBG overall stable.  Continue moderate dose sliding scale   Elevated INR-due to Xarelto , last taken 9/8 Significance unclear, treated with FFP.  Monitor clinically.   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 Lanny   Essential hypertension Asymptomatic hypotension, diastolic No signs or symptoms of sepsis.   Morning cortisol was appropriate. Hypotension resolved.   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  No evidence of ongoing infection.  Left upper extremity redness present for 3 weeks and not suggestive of infective cellulitis.  Multiple abnormalities probably related to anemia and other comorbidities. Monitor off antibiotics. CT findings noted, however there are no signs or symptoms to suggest pneumonia.  This could have been aspiration.  Continue to monitor clinically.   Deep Tissue Pressure Injury R ankle, present on admission (at the time of the admission order)    Goals of care: Patient confirms full code at bedside. Fifth admission in last 4 months   Disposition: Plan to return to [facility] and rehab for short-term rehab      Subjective:  Feels fine  Physical Exam: Vitals:   06/06/24 2318 06/07/24 0036 06/07/24 0443 06/07/24 0851  BP:  (!) 102/45 (!) 112/45 (!) 118/51  Pulse:  94 89 91  Resp: 18 18 18 18   Temp:  98.4 F (36.9 C) 98.1 F (36.7 C) 97.8 F (36.6 C)  TempSrc:  Oral Oral Oral  SpO2:  100% 97% 100%  Weight:      Height:       Physical Exam Vitals reviewed.  Constitutional:      General: She is not in acute distress.    Appearance: She is not ill-appearing or toxic-appearing.  Cardiovascular:     Rate and Rhythm: Normal rate and regular rhythm.     Heart sounds: No murmur heard. Pulmonary:     Effort: Pulmonary effort is normal. No respiratory distress.  Breath sounds: No wheezing, rhonchi or rales.  Neurological:     Mental Status: She is alert.  Psychiatric:        Mood and Affect: Mood normal.        Behavior: Behavior normal.     Data Reviewed: CBG stable  Family Communication: none  Disposition: Status is:  Inpatient Remains inpatient appropriate because: await SNF     Time spent: 20 minutes  Author: Toribio Door, MD 06/07/2024 9:21 AM  For on call review [email or web address].

## 2024-06-07 NOTE — Plan of Care (Signed)
   Problem: Clinical Measurements: Goal: Ability to maintain clinical measurements within normal limits will improve Outcome: Progressing   Problem: Coping: Goal: Level of anxiety will decrease Outcome: Progressing

## 2024-06-07 NOTE — Plan of Care (Signed)
   Problem: Education: Goal: Knowledge of General Education information will improve Description: Including pain rating scale, medication(s)/side effects and non-pharmacologic comfort measures Outcome: Progressing   Problem: Health Behavior/Discharge Planning: Goal: Ability to manage health-related needs will improve Outcome: Progressing   Problem: Clinical Measurements: Goal: Ability to maintain clinical measurements within normal limits will improve Outcome: Progressing   Problem: Pain Managment: Goal: General experience of comfort will improve and/or be controlled Outcome: Progressing

## 2024-06-08 DIAGNOSIS — E1122 Type 2 diabetes mellitus with diabetic chronic kidney disease: Secondary | ICD-10-CM | POA: Diagnosis not present

## 2024-06-08 DIAGNOSIS — N1831 Chronic kidney disease, stage 3a: Secondary | ICD-10-CM | POA: Diagnosis not present

## 2024-06-08 DIAGNOSIS — D649 Anemia, unspecified: Secondary | ICD-10-CM | POA: Diagnosis not present

## 2024-06-08 LAB — GLUCOSE, CAPILLARY
Glucose-Capillary: 222 mg/dL — ABNORMAL HIGH (ref 70–99)
Glucose-Capillary: 198 mg/dL — ABNORMAL HIGH (ref 70–99)
Glucose-Capillary: 187 mg/dL — ABNORMAL HIGH (ref 70–99)
Glucose-Capillary: 187 mg/dL — ABNORMAL HIGH (ref 70–99)
Glucose-Capillary: 192 mg/dL — ABNORMAL HIGH (ref 70–99)

## 2024-06-08 NOTE — TOC Progression Note (Signed)
 Transition of Care [facility]) - Progression Note    Patient Details  Name: [name] MRN: [id number] Date of Birth: 1941-09-17  Transition of Care [facility]) CM/SW Contact  Tawni CHRISTELLA Eva, LCSW Phone Number: 06/08/2024, 10:45 AM  Clinical Narrative:     CSW spoke with Star from [facility], who reported that their business office needs to speak with the pt's daughter before determining if the pt can return.  CSW later received a message from Star stating that the pt can return to the facility; however, there is no available bed today. Pt can admit to facility tomorrow. IP Care Management    Expected Discharge Plan: Skilled Nursing Facility Barriers to Discharge: Continued Medical Work up               Expected Discharge Plan and Services In-house Referral: NA Discharge Planning Services: CM Consult Post Acute Care Choice: Skilled Nursing Facility Living arrangements for the past 2 months: Skilled Nursing Facility                 DME Arranged: N/A DME Agency: NA       HH Arranged: NA HH Agency: NA         Social Drivers of Health (SDOH) Interventions SDOH Screenings   Food Insecurity: No Food Insecurity (06/02/2024)  Housing: Low Risk  (06/02/2024)  Transportation Needs: No Transportation Needs (06/02/2024)  Utilities: Not At Risk (06/02/2024)  Depression (PHQ2-9): Low Risk  (05/18/2024)  Social Connections: Moderately Isolated (06/02/2024)  Tobacco Use: Medium Risk (06/04/2024)    Readmission Risk Interventions    06/04/2024    3:35 PM 04/16/2024    3:13 PM  Readmission Risk Prevention Plan  Transportation Screening Complete Complete  PCP or Specialist Appt within 3-5 Days  Complete  HRI or Home Care Consult  Complete  Social Work Consult for Recovery Care Planning/Counseling  Complete  Medication Review [employer]) Complete Referral to Pharmacy  PCP or Specialist appointment within 3-5 days of discharge Complete   HRI or Home Care Consult Complete    SW Recovery Care/Counseling Consult Complete   Palliative Care Screening Not Applicable   Skilled Nursing Facility Complete

## 2024-06-08 NOTE — Progress Notes (Signed)
 Progress Note   Patient: [name] FMW:[id number] DOB: 01-12-1941 DOA: 06/02/2024     6 DOS: the patient was seen and examined on 06/08/2024   Brief hospital course: 83 year old woman PMH including gallbladder cancer, chronic anemia requiring multiple transfusions, presented with hemoglobin of 6.7, had 2 episodes of hematemesis.  Admitted for further evaluation.  Transfused multiple units of blood.  Underwent EGD as below.  Improving.  Likely transition back to SNF to 3 days  Consultants GI  Procedures/Events 9/11 EGD: Normal esophagus, clotted blood in gastric body, nonbleeding gastric ulcers biopsied, erythematous mucosa stomach.  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  Biopsies pending.   Started on Carafate , continue Protonix  40 mg twice daily indefinitely.  Hold anticoagulation, if needs to restart, consider apixaban.   AKI CKD stage II CKD stage III ruled out Baseline creatinine around 1.0.  Peak creatinine 1.76. Creatinine stable, will follow intermittently.   Hypomagnesemia repleted Potassium borderline, repleted   PAF Acquired thrombophilia Currently in sinus rhythm.  Hold Xarelto  given above.  Continue metoprolol     DM 2-non-insulin -dependent Hold home Jardiance  and metformin  CBG overall stable.  Continue moderate dose sliding scale   Elevated INR-due to Xarelto , last taken 9/8 Significance unclear, treated with FFP.  Monitor clinically.   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 Lanny   Essential hypertension Asymptomatic hypotension, diastolic No signs or symptoms of sepsis.   Morning cortisol was appropriate. Hypotension resolved.   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  No evidence of ongoing infection.  Left upper extremity redness present for 3 weeks and not suggestive of infective cellulitis.  Multiple abnormalities probably related to anemia and other comorbidities. Monitored off antibiotics. CT findings noted, however there are no signs or symptoms to suggest pneumonia.  This could have been aspiration.  Continue to monitor clinically.   Deep Tissue Pressure Injury R ankle, present on admission (at the time of the admission order)    Goals of care: Patient confirmed full code at bedside. Fifth admission in last 4 months   Disposition: Plan to return to [facility] and rehab for short-term rehab     Subjective:  Feels fine  Physical Exam: Vitals:   06/07/24 1437 06/07/24 2131 06/08/24 0442 06/08/24 1417  BP: (!) 114/45 (!) 118/56 (!) 114/41 (!) 129/49  Pulse: 95 (!) 101 82 87  Resp: 18 17 19 15   Temp: (!) 97.5 F (36.4 C) 98.3 F (36.8 C) 98 F (36.7 C) 97.9 F (36.6 C)  TempSrc: Oral Oral Oral   SpO2: 100% 100% 99% 100%  Weight:      Height:       Physical Exam Vitals reviewed.  Constitutional:      General: She is not in acute distress.    Appearance: She is not ill-appearing or toxic-appearing.  Cardiovascular:     Rate and Rhythm: Normal rate and regular rhythm.     Heart sounds: No murmur heard. Pulmonary:     Effort: Pulmonary effort is normal.  No respiratory distress.     Breath sounds: No wheezing, rhonchi or rales.  Neurological:     Mental Status: She is alert.  Psychiatric:        Mood and Affect: Mood normal.        Behavior: Behavior normal.     Data Reviewed: CBG stable  Family Communication:  none  Disposition: Status is: Inpatient Remains inpatient appropriate because: await SNF     Time spent: 15 minutes  Author: Toribio Door, MD 06/08/2024 7:58 PM  For on call review [email or web address].

## 2024-06-08 NOTE — Plan of Care (Incomplete)
  Problem: Clinical Measurements: Goal: Ability to maintain clinical measurements within normal limits will improve Outcome: Progressing Goal: Diagnostic test results will improve Outcome: Progressing   Problem: Nutrition: Goal: Adequate nutrition will be maintained Outcome: Progressing   Problem: Pain Managment: Goal: General experience of comfort will improve and/or be controlled Outcome: Progressing   Problem: Safety: Goal: Ability to remain free from injury will improve Outcome: Progressing   Problem: Skin Integrity: Goal: Risk for impaired skin integrity will decrease Outcome: Progressing   Problem: Fluid Volume: Goal: Ability to maintain a balanced intake and output will improve Outcome: Progressing   Problem: Nutritional: Goal: Maintenance of adequate nutrition will improve Outcome: Progressing Goal: Progress toward achieving an optimal weight will improve Outcome: Progressing   Problem: Skin Integrity: Goal: Risk for impaired skin integrity will decrease Outcome: Progressing   Problem: Tissue Perfusion: Goal: Adequacy of tissue perfusion will improve Outcome: Progressing   Problem: Activity: Goal: Risk for activity intolerance will decrease Outcome: Not Progressing

## 2024-06-08 NOTE — Anesthesia Postprocedure Evaluation (Signed)
 Anesthesia Post Note  Patient: [name]  Procedure(s) Performed: EGD (ESOPHAGOGASTRODUODENOSCOPY)     Patient location during evaluation: PACU Anesthesia Type: MAC Level of consciousness: awake and alert Pain management: pain level controlled Vital Signs Assessment: post-procedure vital signs reviewed and stable Respiratory status: spontaneous breathing Cardiovascular status: stable Anesthetic complications: no   No notable events documented.  Last Vitals:  Vitals:   06/08/24 0442 06/08/24 1417  BP: (!) 114/41 (!) 129/49  Pulse: 82 87  Resp: 19 15  Temp: 36.7 C 36.6 C  SpO2: 99% 100%    Last Pain:  Vitals:   06/08/24 1215  TempSrc:   PainSc: 0-No pain                 Norleen Pope

## 2024-06-08 NOTE — Plan of Care (Signed)
  Problem: Education: Goal: Knowledge of General Education information will improve Description: Including pain rating scale, medication(s)/side effects and non-pharmacologic comfort measures Outcome: Progressing   Problem: Clinical Measurements: Goal: Ability to maintain clinical measurements within normal limits will improve Outcome: Progressing   Problem: Coping: Goal: Level of anxiety will decrease Outcome: Progressing   

## 2024-06-09 DIAGNOSIS — D649 Anemia, unspecified: Secondary | ICD-10-CM | POA: Diagnosis not present

## 2024-06-09 DIAGNOSIS — D6869 Other thrombophilia: Secondary | ICD-10-CM | POA: Diagnosis not present

## 2024-06-09 DIAGNOSIS — I480 Paroxysmal atrial fibrillation: Secondary | ICD-10-CM | POA: Diagnosis not present

## 2024-06-09 DIAGNOSIS — C23 Malignant neoplasm of gallbladder: Secondary | ICD-10-CM | POA: Diagnosis not present

## 2024-06-09 LAB — GLUCOSE, CAPILLARY
Glucose-Capillary: 215 mg/dL — ABNORMAL HIGH (ref 70–99)
Glucose-Capillary: 201 mg/dL — ABNORMAL HIGH (ref 70–99)
Glucose-Capillary: 188 mg/dL — ABNORMAL HIGH (ref 70–99)

## 2024-06-09 MED ORDER — HEPARIN SOD (PORK) LOCK FLUSH 100 UNIT/ML IV SOLN
500.0000 [IU] | INTRAVENOUS | Status: AC | PRN
  Administered 2024-06-09: 500 [IU]
  Filled 2024-06-09: qty 5

## 2024-06-09 MED ORDER — PANTOPRAZOLE SODIUM 40 MG PO TBEC
40.0000 mg | DELAYED_RELEASE_TABLET | Freq: Two times a day (BID) | ORAL | 0 refills | Status: DC
Start: 2024-06-09 — End: 2024-06-18

## 2024-06-09 MED ORDER — SUCRALFATE 1 G PO TABS: 1.0000 g | ORAL_TABLET | Freq: Four times a day (QID) | ORAL | 0 refills | Status: DC

## 2024-06-09 NOTE — Care Management Important Message (Signed)
 Important Message  Patient Details IM Letter given Name: [name] MRN: [id number] Date of Birth: 24-Apr-1941   Important Message Given:  Yes - Medicare IM     [name] 06/09/2024, 12:37 PM

## 2024-06-09 NOTE — Discharge Summary (Signed)
 Physician Discharge Summary   Patient: [name] MRN: [id number] DOB: 1941/03/23  Admit date:     06/02/2024  Discharge date: 06/09/24  Discharge Physician: Toribio Door   PCP: Chrystal Lamarr RAMAN, MD   Recommendations at discharge:  Follow-up anemia, suggest intermittent CBC, anticoagulation was discontinued, see below, follow-up severe diffuse gastritis, follow-up wound care for sacral pressure injury  Discharge Diagnoses: Principal Problem:   Symptomatic anemia Active Problems:   Type 2 diabetes mellitus with stage 3a chronic kidney disease, without long-term current use of insulin  (HCC)   PAD (peripheral artery disease) (HCC)   AKI (acute kidney injury) (HCC)   Gallbladder cancer (HCC)   Paroxysmal atrial fibrillation (HCC)   Cholangiocarcinoma (HCC)   Stage 4 pressure ulcer (HCC)   Acquired thrombophilia (HCC)   Hematemesis  Resolved Problems:   * No resolved hospital problems. *  Hospital Course: 83 year old woman PMH including gallbladder cancer, chronic anemia requiring multiple transfusions, presented with hemoglobin of 6.7, had 2 episodes of hematemesis.  Admitted for further evaluation.  Transfused multiple units of blood.  Underwent EGD as below.  Stabilized, hospitalization prolonged by wait for SNF.  Patient subsequently decided to go home.  Consultants GI  Procedures/Events 9/11 EGD: Normal esophagus, clotted blood in gastric body, nonbleeding gastric ulcers biopsied, erythematous mucosa stomach.  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  Biopsies showed reactive gastropathy with mild chronic  gastritis. Started on Carafate , continue Protonix  40 mg twice daily indefinitely.  Hold anticoagulation, if needs to restart, consider apixaban.   AKI CKD stage II CKD stage III ruled out Baseline creatinine around 1.0.  Peak creatinine 1.76. Creatinine stable   Hypomagnesemia repleted Potassium borderline, repleted   PAF Acquired thrombophilia Hold Xarelto  given above.  Continue metoprolol     DM 2-non-insulin -dependent Can resume Jardiance  and metformin  CBG overall stable.  Continue moderate dose sliding scale   Elevated INR-due to Xarelto , last taken 9/8 Significance unclear, treated with FFP.  Monitor clinically.   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 Lanny   Essential hypertension Asymptomatic hypotension, diastolic No signs or symptoms of sepsis.  Morning cortisol was appropriate. Hypotension resolved.   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  No evidence of ongoing infection.  Left upper extremity redness present for 3 weeks and not suggestive of infective cellulitis.  Multiple abnormalities probably related to anemia and other comorbidities. Monitored off antibiotics. CT findings noted, however there are no signs or symptoms to suggest pneumonia.  This could have been aspiration.  Continue to monitor clinically.   Deep Tissue Pressure Injury R ankle, present on admission (at the time of the admission order)    Goals of care: Patient confirmed full code at bedside. Fifth admission in last 4 months  Disposition: Home health Diet recommendation:  Cardiac and Carb modified diet DISCHARGE MEDICATION: Allergies as of 06/09/2024       Reactions   Versed  [midazolam ] Anxiety, Other (See Comments)   Panic Agitation   Bactrim   [sulfamethoxazole -trimethoprim ] Other (See Comments)   ^  K+( elevated)    Augmentin [amoxicillin-pot Clavulanate] Other (See Comments)   Elevates potassium level   Demerol [meperidine] Other (See Comments)   Delusions   Transderm-scop [scopolamine] Other (See Comments)   Delusions   Ultram  [tramadol ] Other (See Comments)   Insomnia         Medication List     STOP taking these medications    hydrochlorothiazide  12.5 MG tablet Commonly known as: HYDRODIURIL    Xarelto  10 MG Tabs tablet Generic drug: rivaroxaban        TAKE these medications    acetaminophen  500 MG tablet Commonly known as: TYLENOL  Take 1,000 mg by mouth every 6 (six) hours as needed for moderate pain (pain score 4-6), fever or mild pain (pain score 1-3).   brimonidine  0.2 % ophthalmic solution Commonly known as: ALPHAGAN  Place 1 drop into the right eye 2 (two) times daily.   ferrous sulfate  325 (65 FE) MG EC tablet Take 1 tablet (325 mg total) by mouth daily before breakfast. To be taken with a cup of orange juice on an otherwise empty stomach if possible What changed:  when to take this additional instructions   furosemide  20 MG tablet Commonly known as: LASIX  Take 1 tablet (20 mg total) by mouth daily.   HumaLOG  Mix 75/25 KwikPen (75-25) 100 UNIT/ML Kwikpen Generic drug: Insulin  Lispro Prot & Lispro Inject 0-6 Units into the skin daily as needed (hyperglycemia).   Jardiance  25 MG Tabs tablet Generic drug: empagliflozin  Take 25 mg by mouth daily.   magnesium  oxide 400 (240 Mg) MG tablet Commonly known as: MAG-OX Take 400 mg by mouth 2 (two) times daily.   metFORMIN  1000 MG tablet Commonly known as: GLUCOPHAGE  Take 1,000 mg by mouth 2 (two) times daily.   metoprolol  tartrate 25 MG tablet Commonly known as: LOPRESSOR  Take 1 tablet (25 mg total) by mouth 2 (two) times daily.   mirtazapine  15 MG tablet Commonly known as: REMERON  Take 15 mg by mouth at bedtime.   nystatin  powder Commonly known as: MYCOSTATIN/NYSTOP Apply 1 Application topically 2 (two) times daily as needed (skin irritation).   pantoprazole  40 MG tablet Commonly known as: Protonix  Take 1 tablet (40 mg total) by mouth 2 (two) times daily. What changed: when to take this   polyethylene glycol 17 g packet Commonly known as: MIRALAX  / GLYCOLAX  Take 17 g by mouth daily. What changed:  when to take this reasons to take this   rosuvastatin  40 MG tablet Commonly known as: CRESTOR  Take 40 mg by mouth at bedtime.   sucralfate  1 g tablet Commonly known as: Carafate  Take 1 tablet (1 g total) by mouth 4 (four) times daily.   Systane Balance 0.6 % Soln Generic drug: Propylene Glycol Place 1 drop into the left eye 3 (three) times daily as needed (dry/irritated eyes).   telmisartan  40 MG tablet Commonly known as: MICARDIS  Take 40 mg by mouth daily.   traZODone  100 MG tablet Commonly known as: DESYREL  Take 100 mg by mouth at bedtime.               Discharge Care Instructions  (From admission, onward)           Start     Ordered   06/09/24 0000  Discharge wound care:       Comments: Wound care  Daily      Comments: 1.        Cleanse sacral and R buttock wound with Vashe wound cleanser, using a Q  tip applicator fill wound bed with Vashe moistened Kerlix, cover with dry gauze and silicone foam or ABD pad whichever is preferred.  2.         Cleanse L lateral foot and R ankle wounds with Vashe, cover with Xeroform gauze Soila [phone]) and secure with silicone foam or Kerlix roll gauze.  Change daily. Would place B feet in Prevalon boots to offload pressure. Wound care  2 times daily      Comments: Cleanse under pannus with soap and water , dry and apply Interdry [employer] # [phone] Measure and cut length of InterDry to fit in skin folds that have skin breakdown Tuck InterDry fabric into skin folds in a single layer, allow for 2 inches of overhang from skin edges to allow for  wicking to occur May remove to bathe; dry area thoroughly and then tuck into affected areas again Do not apply any creams or ointments when using InterDry DO NOT THROW AWAY FOR 5 DAYS unless soiled with stool DO NOT [facility] product, this will inactivate the silver in the material  New sheet of Interdry should be applied after 5 days of use if patient continues to have skin breakdown   06/09/24 1136            Follow-up Information     Chrystal Lamarr RAMAN, MD. Schedule an appointment as soon as possible for a visit in 2 week(s).   Specialty: Family Medicine Contact information: [address] [place] [address] [phone]                Feels fine, has decided to go home  Discharge Exam: Filed Weights   06/03/24 1103 06/04/24 1255  Weight: 63.6 kg 63.6 kg   Physical Exam Vitals reviewed.  Constitutional:      General: She is not in acute distress.    Appearance: She is not ill-appearing or toxic-appearing.  Cardiovascular:     Rate and Rhythm: Normal rate and regular rhythm.     Heart sounds: No murmur heard. Pulmonary:     Effort: Pulmonary effort is normal. No respiratory distress.     Breath sounds: No wheezing, rhonchi or rales.  Neurological:     Mental Status: She is alert.  Psychiatric:        Mood and Affect: Mood normal.        Behavior: Behavior normal.      Condition at discharge: good  The results of significant diagnostics from this hospitalization (including imaging, microbiology, ancillary and laboratory) are listed below for reference.   Imaging Studies: CT CHEST ABDOMEN PELVIS WO CONTRAST Result Date: 06/02/2024 CLINICAL DATA:  known biliary cancer. worsening leukocytosis. Eval for abscess. Stent in CBD. * Tracking Code: BO * EXAM: CT CHEST, ABDOMEN AND PELVIS WITHOUT CONTRAST TECHNIQUE: Multidetector CT imaging of the chest, abdomen and pelvis was performed following the standard protocol without IV contrast. RADIATION DOSE  REDUCTION: This exam was performed according to the departmental dose-optimization program which includes automated exposure control, adjustment of the mA and/or kV according to patient size and/or use of iterative reconstruction technique. COMPARISON:  CT scan chest, abdomen and pelvis from 04/23/2024. FINDINGS: CT CHEST FINDINGS Cardiovascular: Normal cardiac size. No pericardial effusion. No aortic aneurysm. There are coronary artery calcifications, in keeping with coronary artery disease. There are also moderate to severe peripheral atherosclerotic vascular calcifications of thoracic aorta and its major branches. Mediastinum/Nodes: Visualized thyroid  gland appears grossly unremarkable. No solid / cystic mediastinal masses. There is  fluid-filled and dilated esophagus, which is nonspecific but most likely seen in the settings of chronic gastroesophageal reflux disease versus esophageal dysmotility. No mediastinal or axillary lymphadenopathy by size criteria. Evaluation of bilateral hila is limited due to lack on intravenous contrast: however, no large hilar lymphadenopathy identified. Lungs/Pleura: The central tracheo-bronchial tree is patent. Since the prior study, there are new airspace opacities in the left lung lower lobe, highly concerning for bronchopneumonia. Follow-up to clearing is recommended. No pleural effusion or pneumothorax. No lung mass or collapse. There are multiple scattered sub 4 mm noncalcified nodules in the right lung (marked with electronic arrow sign on series 100), which are unchanged since the prior study. No new suspicious lung nodule seen. Musculoskeletal: The visualized soft tissues of the chest wall are grossly unremarkable. No suspicious osseous lesions. There are mild to moderate multilevel degenerative changes in the visualized spine. CT ABDOMEN PELVIS FINDINGS Hepatobiliary: The liver is normal in size. Non-cirrhotic configuration. Previously seen hypoattenuating lesion in the  segment 4A is not well seen on today's exam due to lack of intravenous contrast. There is an irregular hypoattenuating lesion in the right hepatic lobe, segment 4B (series 2, image 54), which appears decreased in size since the prior study. No other discrete liver lesion seen within the limitations of this unenhanced exam. Contrast-enhanced MRI or CT scan exam is recommended for better evaluation of liver lesions. No intrahepatic bile duct dilation. Left hepatic lobe pneumobilia noted. There is stable positioning of the biliary stent. Gallbladder is surgically absent. Pancreas: Unremarkable. No pancreatic ductal dilatation or surrounding inflammatory changes. Spleen: Within normal limits. No focal lesion. Adrenals/Urinary Tract: Adrenal glands are unremarkable. No suspicious renal mass within the limitations of this unenhanced exam. No nephroureterolithiasis or obstructive uropathy. Bilateral extensive renal vascular calcifications noted. Unremarkable urinary bladder. Stomach/Bowel: No disproportionate dilation of the small or large bowel loops. No evidence of abnormal bowel wall thickening or inflammatory changes. The appendix was not visualized; however there is no acute inflammatory process in the right lower quadrant. Vascular/Lymphatic: There is mild ascites in the dependent pelvis. No walled-off abscess. No pneumoperitoneum. No abdominal or pelvic lymphadenopathy, by size criteria. No aneurysmal dilation of the major abdominal arteries. There are marked peripheral atherosclerotic vascular calcifications of the aorta and its major branches. Reproductive: The uterus is unremarkable. No large adnexal mass. Other: There is decubitus ulcer overlying the midline/right paramedian gluteal region. Underlying coccyx appears intact. No focal bone erosions. There is moderate anasarca. Musculoskeletal: No suspicious osseous lesions. There are mild - moderate multilevel degenerative changes in the visualized spine.  IMPRESSION: 1. There are new airspace opacities in the left lung lower lobe, compatible with pneumonia. Follow-up to clearing is recommended. 2. There is a decubitus ulcer overlying the midline/right paramedian gluteal region. Underlying coccyx appears intact. No focal bone erosions. 3. Mild ascites. No walled-off abscess. No walled-off abscess or pneumoperitoneum. 4. Multiple other nonacute observations, as described above. Aortic Atherosclerosis (ICD10-I70.0). Electronically Signed   By: Ree Molt M.D.   On: 06/02/2024 14:43   DG Chest Portable 1 View Result Date: 06/02/2024 CLINICAL DATA:  Infectious evaluation. EXAM: PORTABLE CHEST 1 VIEW COMPARISON:  05/12/2024. FINDINGS: The heart size and mediastinal contours are unchanged. Prior median sternotomy and CABG. Left chest Port-A-Cath is unchanged. No focal consolidation, pleural effusion, or pneumothorax. No acute osseous abnormality. IMPRESSION: No acute cardiopulmonary findings. Electronically Signed   By: Harrietta Sherry M.D.   On: 06/02/2024 11:42   VAS US  UPPER EXTREMITY VENOUS DUPLEX Result Date:  05/19/2024 UPPER VENOUS STUDY  Patient Name:  IDALIS HOELTING  Date of Exam:   05/19/2024 Medical Rec #: [id number]      Accession #:    [id number] Date of Birth: 07-14-1941      Patient Gender: F Patient Age:   33 years Exam Location:  [address] Procedure:      VAS US  UPPER EXTREMITY VENOUS DUPLEX Referring Phys: ONITA MATTOCK --------------------------------------------------------------------------------  Indications: Edema Performing Technologist: Duwaine Hives RVS  Examination Guidelines: A complete evaluation includes B-mode imaging, spectral Doppler, color Doppler, and power Doppler as needed of all accessible portions of each vessel. Bilateral testing is considered an integral part of a complete examination. Limited examinations for reoccurring indications may be performed as noted.  Right Findings:  +----------+------------+---------+-----------+----------+-------+ RIGHT     CompressiblePhasicitySpontaneousPropertiesSummary +----------+------------+---------+-----------+----------+-------+ IJV           Full       Yes       Yes                      +----------+------------+---------+-----------+----------+-------+ Subclavian    Full       Yes       Yes                      +----------+------------+---------+-----------+----------+-------+ Axillary      Full       Yes       Yes                      +----------+------------+---------+-----------+----------+-------+ Brachial      Full       Yes       Yes                      +----------+------------+---------+-----------+----------+-------+ Radial        Full                                          +----------+------------+---------+-----------+----------+-------+ Ulnar         Full                                          +----------+------------+---------+-----------+----------+-------+ Cephalic      Full                                          +----------+------------+---------+-----------+----------+-------+ Basilic       Full                                          +----------+------------+---------+-----------+----------+-------+  Left Findings: +----------+------------+---------+-----------+----------+-------+ LEFT      CompressiblePhasicitySpontaneousPropertiesSummary +----------+------------+---------+-----------+----------+-------+ Subclavian    Full       Yes       Yes                      +----------+------------+---------+-----------+----------+-------+  Summary:  Right: No evidence of deep vein thrombosis in the upper extremity. No evidence of superficial vein thrombosis in the upper extremity.  Left: No evidence of thrombosis in  the subclavian.  *See table(s) above for measurements and observations.  Diagnosing physician: Lonni Gaskins MD Electronically signed by  Lonni Gaskins MD on 05/19/2024 at 1:47:31 PM.    Final    DG Chest Portable 1 View Result Date: 05/12/2024 EXAM: 1 VIEW XRAY OF THE CHEST 05/12/2024 08:15:00 AM COMPARISON: AP radiograph chest dated 08/60/2025. CLINICAL HISTORY: Weakness, low blood sugar. FINDINGS: LUNGS AND PLEURA: No focal pulmonary opacity. No pulmonary edema. No pleural effusion. No pneumothorax. HEART AND MEDIASTINUM: No acute abnormality of the cardiac and mediastinal silhouettes. The patient is status post CABG. BONES AND SOFT TISSUES: No acute osseous abnormality. There is a left subclavian chest port present. There is also right arm PICC. IMPRESSION: 1. No acute process. Electronically signed by: Evalene Coho MD 05/12/2024 08:19 AM EDT RP Workstation: [id number]    Microbiology: Results for orders placed or performed during the hospital encounter of 06/02/24  MRSA Next Gen by PCR, Nasal     Status: Abnormal   Collection Time: 06/02/24  4:26 PM   Specimen: Nasal Mucosa; Nasal Swab  Result Value Ref Range Status   MRSA by PCR Next Gen DETECTED (A) NOT DETECTED Final    Comment: (NOTE) The GeneXpert MRSA Assay (FDA approved for NASAL specimens only), is one component of a comprehensive MRSA colonization surveillance program. It is not intended to diagnose MRSA infection nor to guide or monitor treatment for MRSA infections. Test performance is not FDA approved in patients less than 67 years old. Performed at [facility], [address] [place], [place] [address]   Blood culture (routine x 2)     Status: None   Collection Time: 06/02/24  5:55 PM   Specimen: BLOOD RIGHT ARM  Result Value Ref Range Status   Specimen Description   Final    BLOOD RIGHT ARM Performed at [facility], [address] [place], [place] [address]    Special Requests   Final    BOTTLES DRAWN AEROBIC AND ANAEROBIC Blood Culture adequate volume Performed at [facility], [address] [place], [place] [address]    Culture   Final    NO GROWTH 5 DAYS Performed at [facility], [address] [place], [place] [address]    Report Status 06/07/2024 FINAL  Final  Blood culture (routine x 2)     Status: None   Collection Time: 06/02/24  5:57 PM   Specimen: BLOOD RIGHT ARM  Result Value Ref Range Status   Specimen Description   Final    BLOOD RIGHT ARM Performed at [facility], [address], [place], [place] [address]    Special Requests   Final    BOTTLES DRAWN AEROBIC AND ANAEROBIC Blood Culture adequate volume Performed at [facility], [address] [place], [place] [address]    Culture   Final    NO GROWTH 5 DAYS Performed at [facility], [address], [place], [place] [address]    Report Status 06/07/2024 FINAL  Final   *Note: Due to a large number of results and/or encounters for the requested time period, some results have not been displayed. A complete set of results can be found in Results Review.    Labs: CBC: Recent Labs  Lab 06/03/24 0806 06/03/24 1626 06/04/24 0342 06/04/24 1034 06/05/24 0455  WBC 13.3* 13.1* 12.4* 14.0* 13.9*  HGB 9.0* 11.9* 10.1* 10.6* 11.0*  HCT 27.3* 36.4 32.2* 33.1* 34.2*  MCV 87.5 84.8 86.6 86.0 87.5  PLT 189 194 159 170 159   Basic Metabolic Panel: Recent Labs  Lab 06/03/24 0111 06/03/24 0806 06/04/24 0342 06/05/24 0455  NA 141 143 142 139  K 3.6 3.4* 3.5 3.6  CL 108 110 110 110  CO2 21* 21* 22 22  GLUCOSE 122* 98 160* 107*  BUN 39* 40* 38* 34*  CREATININE 1.35* 1.24* 1.23* 1.08*  CALCIUM  8.5* 8.2* 8.6* 8.4*  MG  --  1.9 2.0  --   PHOS  --  3.0  --   --    Liver Function Tests: No results for input(s): AST, ALT, ALKPHOS, BILITOT, PROT, ALBUMIN  in the last 168 hours.  CBG: Recent Labs  Lab 06/08/24 1629 06/08/24 1947 06/09/24 0458 06/09/24 0738 06/09/24 1116  GLUCAP 187* 192* 215* 201* 188*    Discharge time spent: less than 30  minutes.  Signed: Toribio Door, MD [facility] 06/09/2024

## 2024-06-09 NOTE — TOC Transition Note (Signed)
 Transition of Care [facility]) - Discharge Note   Patient Details  Name: [name] MRN: [id number] Date of Birth: [birth year]  Transition of [facility]) CM/SW Contact:  Tawni CHRISTELLA Eva, LCSW Phone Number: 06/09/2024, 10:14 AM   Clinical Narrative:    Pt has declined SNF placement and wishes to return home with home health services. Pt is currently active with [facility] for [facility]. CSW has added PT, OT, and SW services to the pt's home health plan; however, orders will be needed to initiate these services.  CSW spoke with the pt's daughter, who reported that she will pick up the pt around 5:30 PM today after she finishes work.   Final next level of care: Home w Home Health Services Barriers to Discharge: Barriers Resolved   Patient Goals and CMS Choice Patient states their goals for this hospitalization and ongoing recovery are:: retrun home CMS Medicare.gov Compare Post Acute Care list provided to:: Patient Choice offered to / list presented to : Patient [place] ownership interest in [facility].provided to:: Patient    Discharge Placement                    Patient and family notified of of transfer: 06/09/24  Discharge Plan and Services Additional resources added to the After Visit Summary for   In-house Referral: NA Discharge Planning Services: CM Consult Post Acute Care Choice: Skilled Nursing Facility          DME Arranged: N/A DME Agency: NA       HH Arranged: NA HH Agency: NA        Social Drivers of Health (SDOH) Interventions SDOH Screenings   Food Insecurity: No Food Insecurity (06/02/2024)  Housing: Low Risk  (06/02/2024)  Transportation Needs: No Transportation Needs (06/02/2024)  Utilities: Not At Risk (06/02/2024)  Depression (PHQ2-9): Low Risk  (05/18/2024)  Social Connections: Moderately Isolated (06/02/2024)  Tobacco Use: Medium Risk (06/04/2024)     Readmission Risk Interventions    06/04/2024    3:35 PM 04/16/2024    3:13 PM   Readmission Risk Prevention Plan  Transportation Screening Complete Complete  PCP or Specialist Appt within 3-5 Days  Complete  HRI or Home Care Consult  Complete  Social Work Consult for Recovery Care Planning/Counseling  Complete  Medication Review Ocean[employer]) Complete Referral to Pharmacy  PCP or Specialist appointment within 3-5 days of discharge Complete   HRI or Home Care Consult Complete   SW Recovery Care/Counseling Consult Complete   Palliative Care Screening Not Applicable   Skilled Nursing Facility Complete

## 2024-06-11 DIAGNOSIS — L89892 Pressure ulcer of other site, stage 2: Secondary | ICD-10-CM | POA: Diagnosis not present

## 2024-06-11 DIAGNOSIS — I2510 Atherosclerotic heart disease of native coronary artery without angina pectoris: Secondary | ICD-10-CM | POA: Diagnosis not present

## 2024-06-11 DIAGNOSIS — I129 Hypertensive chronic kidney disease with stage 1 through stage 4 chronic kidney disease, or unspecified chronic kidney disease: Secondary | ICD-10-CM | POA: Diagnosis not present

## 2024-06-11 DIAGNOSIS — L89154 Pressure ulcer of sacral region, stage 4: Secondary | ICD-10-CM | POA: Diagnosis not present

## 2024-06-11 DIAGNOSIS — L89513 Pressure ulcer of right ankle, stage 3: Secondary | ICD-10-CM | POA: Diagnosis not present

## 2024-06-11 DIAGNOSIS — D500 Iron deficiency anemia secondary to blood loss (chronic): Secondary | ICD-10-CM | POA: Diagnosis not present

## 2024-06-12 ENCOUNTER — Telehealth: Payer: Self-pay

## 2024-06-12 ENCOUNTER — Other Ambulatory Visit: Payer: Self-pay

## 2024-06-12 NOTE — Telephone Encounter (Signed)
 Received telephone call from Tiffany, patient's daughter requesting order/Rx for a hospital bed to be delivered to patient's home. Daughter stated she was advised x patient's PCP to reach out to patient's oncologist. Let the daughter know that I would forward her message to Dr. Lanny and her team.

## 2024-06-13 ENCOUNTER — Other Ambulatory Visit: Payer: Self-pay

## 2024-06-13 ENCOUNTER — Emergency Department (HOSPITAL_COMMUNITY)

## 2024-06-13 ENCOUNTER — Encounter (HOSPITAL_COMMUNITY): Payer: Self-pay | Admitting: *Deleted

## 2024-06-13 ENCOUNTER — Inpatient Hospital Stay (HOSPITAL_COMMUNITY)
Admission: EM | Admit: 2024-06-13 | Discharge: 2024-06-24 | DRG: 871 | Disposition: E | Attending: Internal Medicine | Admitting: Internal Medicine

## 2024-06-13 DIAGNOSIS — Z89422 Acquired absence of other left toe(s): Secondary | ICD-10-CM

## 2024-06-13 DIAGNOSIS — R109 Unspecified abdominal pain: Secondary | ICD-10-CM | POA: Diagnosis not present

## 2024-06-13 DIAGNOSIS — Z881 Allergy status to other antibiotic agents status: Secondary | ICD-10-CM

## 2024-06-13 DIAGNOSIS — Z89421 Acquired absence of other right toe(s): Secondary | ICD-10-CM

## 2024-06-13 DIAGNOSIS — E1122 Type 2 diabetes mellitus with diabetic chronic kidney disease: Secondary | ICD-10-CM | POA: Diagnosis present

## 2024-06-13 DIAGNOSIS — K652 Spontaneous bacterial peritonitis: Secondary | ICD-10-CM | POA: Diagnosis not present

## 2024-06-13 DIAGNOSIS — Z884 Allergy status to anesthetic agent status: Secondary | ICD-10-CM

## 2024-06-13 DIAGNOSIS — L8994 Pressure ulcer of unspecified site, stage 4: Secondary | ICD-10-CM | POA: Diagnosis not present

## 2024-06-13 DIAGNOSIS — R627 Adult failure to thrive: Secondary | ICD-10-CM | POA: Diagnosis present

## 2024-06-13 DIAGNOSIS — C23 Malignant neoplasm of gallbladder: Secondary | ICD-10-CM | POA: Diagnosis present

## 2024-06-13 DIAGNOSIS — J189 Pneumonia, unspecified organism: Principal | ICD-10-CM

## 2024-06-13 DIAGNOSIS — E1151 Type 2 diabetes mellitus with diabetic peripheral angiopathy without gangrene: Secondary | ICD-10-CM | POA: Diagnosis present

## 2024-06-13 DIAGNOSIS — L89520 Pressure ulcer of left ankle, unstageable: Secondary | ICD-10-CM | POA: Diagnosis present

## 2024-06-13 DIAGNOSIS — E46 Unspecified protein-calorie malnutrition: Secondary | ICD-10-CM | POA: Diagnosis present

## 2024-06-13 DIAGNOSIS — J690 Pneumonitis due to inhalation of food and vomit: Secondary | ICD-10-CM | POA: Diagnosis not present

## 2024-06-13 DIAGNOSIS — I480 Paroxysmal atrial fibrillation: Secondary | ICD-10-CM | POA: Diagnosis present

## 2024-06-13 DIAGNOSIS — Z823 Family history of stroke: Secondary | ICD-10-CM

## 2024-06-13 DIAGNOSIS — Z83438 Family history of other disorder of lipoprotein metabolism and other lipidemia: Secondary | ICD-10-CM

## 2024-06-13 DIAGNOSIS — L89154 Pressure ulcer of sacral region, stage 4: Secondary | ICD-10-CM | POA: Diagnosis present

## 2024-06-13 DIAGNOSIS — A419 Sepsis, unspecified organism: Secondary | ICD-10-CM | POA: Diagnosis not present

## 2024-06-13 DIAGNOSIS — Z1152 Encounter for screening for COVID-19: Secondary | ICD-10-CM

## 2024-06-13 DIAGNOSIS — K219 Gastro-esophageal reflux disease without esophagitis: Secondary | ICD-10-CM | POA: Diagnosis present

## 2024-06-13 DIAGNOSIS — I129 Hypertensive chronic kidney disease with stage 1 through stage 4 chronic kidney disease, or unspecified chronic kidney disease: Secondary | ICD-10-CM | POA: Diagnosis present

## 2024-06-13 DIAGNOSIS — E1169 Type 2 diabetes mellitus with other specified complication: Secondary | ICD-10-CM | POA: Diagnosis present

## 2024-06-13 DIAGNOSIS — E875 Hyperkalemia: Secondary | ICD-10-CM | POA: Diagnosis present

## 2024-06-13 DIAGNOSIS — Z6822 Body mass index (BMI) 22.0-22.9, adult: Secondary | ICD-10-CM

## 2024-06-13 DIAGNOSIS — R531 Weakness: Secondary | ICD-10-CM | POA: Diagnosis not present

## 2024-06-13 DIAGNOSIS — Z800 Family history of malignant neoplasm of digestive organs: Secondary | ICD-10-CM

## 2024-06-13 DIAGNOSIS — Z515 Encounter for palliative care: Secondary | ICD-10-CM | POA: Diagnosis not present

## 2024-06-13 DIAGNOSIS — M4628 Osteomyelitis of vertebra, sacral and sacrococcygeal region: Secondary | ICD-10-CM | POA: Diagnosis present

## 2024-06-13 DIAGNOSIS — Z951 Presence of aortocoronary bypass graft: Secondary | ICD-10-CM

## 2024-06-13 DIAGNOSIS — C221 Intrahepatic bile duct carcinoma: Secondary | ICD-10-CM | POA: Diagnosis present

## 2024-06-13 DIAGNOSIS — Z8249 Family history of ischemic heart disease and other diseases of the circulatory system: Secondary | ICD-10-CM

## 2024-06-13 DIAGNOSIS — E871 Hypo-osmolality and hyponatremia: Secondary | ICD-10-CM | POA: Diagnosis present

## 2024-06-13 DIAGNOSIS — Z87891 Personal history of nicotine dependence: Secondary | ICD-10-CM

## 2024-06-13 DIAGNOSIS — Z9221 Personal history of antineoplastic chemotherapy: Secondary | ICD-10-CM

## 2024-06-13 DIAGNOSIS — I959 Hypotension, unspecified: Secondary | ICD-10-CM | POA: Diagnosis present

## 2024-06-13 DIAGNOSIS — L89510 Pressure ulcer of right ankle, unstageable: Secondary | ICD-10-CM | POA: Diagnosis present

## 2024-06-13 DIAGNOSIS — E8721 Acute metabolic acidosis: Secondary | ICD-10-CM | POA: Diagnosis present

## 2024-06-13 DIAGNOSIS — R918 Other nonspecific abnormal finding of lung field: Secondary | ICD-10-CM | POA: Diagnosis not present

## 2024-06-13 DIAGNOSIS — Z888 Allergy status to other drugs, medicaments and biological substances status: Secondary | ICD-10-CM

## 2024-06-13 DIAGNOSIS — Z79899 Other long term (current) drug therapy: Secondary | ICD-10-CM

## 2024-06-13 DIAGNOSIS — E785 Hyperlipidemia, unspecified: Secondary | ICD-10-CM | POA: Diagnosis present

## 2024-06-13 DIAGNOSIS — Z808 Family history of malignant neoplasm of other organs or systems: Secondary | ICD-10-CM

## 2024-06-13 DIAGNOSIS — N1831 Chronic kidney disease, stage 3a: Secondary | ICD-10-CM | POA: Diagnosis not present

## 2024-06-13 DIAGNOSIS — R1084 Generalized abdominal pain: Secondary | ICD-10-CM | POA: Diagnosis not present

## 2024-06-13 DIAGNOSIS — N200 Calculus of kidney: Secondary | ICD-10-CM | POA: Diagnosis not present

## 2024-06-13 DIAGNOSIS — I10 Essential (primary) hypertension: Secondary | ICD-10-CM | POA: Diagnosis present

## 2024-06-13 DIAGNOSIS — Z794 Long term (current) use of insulin: Secondary | ICD-10-CM

## 2024-06-13 DIAGNOSIS — N179 Acute kidney failure, unspecified: Secondary | ICD-10-CM | POA: Diagnosis present

## 2024-06-13 DIAGNOSIS — R6521 Severe sepsis with septic shock: Secondary | ICD-10-CM | POA: Diagnosis present

## 2024-06-13 DIAGNOSIS — R197 Diarrhea, unspecified: Secondary | ICD-10-CM | POA: Diagnosis not present

## 2024-06-13 DIAGNOSIS — Z7984 Long term (current) use of oral hypoglycemic drugs: Secondary | ICD-10-CM

## 2024-06-13 DIAGNOSIS — Z66 Do not resuscitate: Secondary | ICD-10-CM | POA: Diagnosis not present

## 2024-06-13 DIAGNOSIS — I2510 Atherosclerotic heart disease of native coronary artery without angina pectoris: Secondary | ICD-10-CM | POA: Diagnosis present

## 2024-06-13 DIAGNOSIS — I252 Old myocardial infarction: Secondary | ICD-10-CM

## 2024-06-13 DIAGNOSIS — R6520 Severe sepsis without septic shock: Secondary | ICD-10-CM | POA: Diagnosis not present

## 2024-06-13 DIAGNOSIS — R579 Shock, unspecified: Secondary | ICD-10-CM | POA: Diagnosis not present

## 2024-06-13 DIAGNOSIS — R188 Other ascites: Secondary | ICD-10-CM | POA: Diagnosis present

## 2024-06-13 DIAGNOSIS — E8809 Other disorders of plasma-protein metabolism, not elsewhere classified: Secondary | ICD-10-CM | POA: Diagnosis present

## 2024-06-13 DIAGNOSIS — Z885 Allergy status to narcotic agent status: Secondary | ICD-10-CM

## 2024-06-13 HISTORY — DX: Cellulitis of left lower limb: L03.116

## 2024-06-13 LAB — COMPREHENSIVE METABOLIC PANEL WITH GFR
ALT: 42 U/L (ref 0–44)
AST: 111 U/L — ABNORMAL HIGH (ref 15–41)
Albumin: <1.5 g/dL — ABNORMAL LOW (ref 3.5–5.0)
Alkaline Phosphatase: 388 U/L — ABNORMAL HIGH (ref 38–126)
Anion gap: 12 (ref 5–15)
BUN: 48 mg/dL — ABNORMAL HIGH (ref 8–23)
CO2: 17 mmol/L — ABNORMAL LOW (ref 22–32)
Calcium: 8.3 mg/dL — ABNORMAL LOW (ref 8.9–10.3)
Chloride: 105 mmol/L (ref 98–111)
Creatinine, Ser: 2.16 mg/dL — ABNORMAL HIGH (ref 0.44–1.00)
GFR, Estimated: 22 mL/min — ABNORMAL LOW (ref >60)
Glucose, Bld: 85 mg/dL (ref 70–99)
Potassium: 5.4 mmol/L — ABNORMAL HIGH (ref 3.5–5.1)
Sodium: 134 mmol/L — ABNORMAL LOW (ref 135–145)
Total Bilirubin: 0.8 mg/dL (ref 0.0–1.2)
Total Protein: 3.8 g/dL — ABNORMAL LOW (ref 6.5–8.1)

## 2024-06-13 LAB — CBC WITH DIFFERENTIAL/PLATELET
Abs Immature Granulocytes: 0.13 K/uL — ABNORMAL HIGH (ref 0.00–0.07)
Basophils Absolute: 0 K/uL (ref 0.0–0.1)
Basophils Relative: 0 %
Eosinophils Absolute: 0 K/uL (ref 0.0–0.5)
Eosinophils Relative: 0 %
HCT: 34.1 % — ABNORMAL LOW (ref 36.0–46.0)
Hemoglobin: 11.1 g/dL — ABNORMAL LOW (ref 12.0–15.0)
Immature Granulocytes: 1 %
Lymphocytes Relative: 1 %
Lymphs Abs: 0.3 K/uL — ABNORMAL LOW (ref 0.7–4.0)
MCH: 28.5 pg (ref 26.0–34.0)
MCHC: 32.6 g/dL (ref 30.0–36.0)
MCV: 87.7 fL (ref 80.0–100.0)
Monocytes Absolute: 1.3 K/uL — ABNORMAL HIGH (ref 0.1–1.0)
Monocytes Relative: 5 %
Neutro Abs: 23.4 K/uL — ABNORMAL HIGH (ref 1.7–7.7)
Neutrophils Relative %: 93 %
Platelets: 287 K/uL (ref 150–400)
RBC: 3.89 MIL/uL (ref 3.87–5.11)
RDW: 20.9 % — ABNORMAL HIGH (ref 11.5–15.5)
Smear Review: NORMAL
WBC: 25.1 K/uL — ABNORMAL HIGH (ref 4.0–10.5)
nRBC: 0 % (ref 0.0–0.2)

## 2024-06-13 LAB — RESP PANEL BY RT-PCR (RSV, FLU A&B, COVID)  RVPGX2
Influenza A by PCR: NEGATIVE
Influenza B by PCR: NEGATIVE
Resp Syncytial Virus by PCR: NEGATIVE
SARS Coronavirus 2 by RT PCR: NEGATIVE

## 2024-06-13 LAB — LIPASE, BLOOD: Lipase: 16 U/L (ref 11–51)

## 2024-06-13 LAB — PROTIME-INR
INR: 1.3 — ABNORMAL HIGH (ref 0.8–1.2)
Prothrombin Time: 17 s — ABNORMAL HIGH (ref 11.4–15.2)

## 2024-06-13 LAB — APTT: aPTT: 43 s — ABNORMAL HIGH (ref 24–36)

## 2024-06-13 LAB — I-STAT CG4 LACTIC ACID, ED: Lactic Acid, Venous: 2 mmol/L (ref 0.5–1.9)

## 2024-06-13 MED ORDER — INSULIN ASPART 100 UNIT/ML IJ SOLN: 0.0000 [IU] | Freq: Every day | INTRAMUSCULAR | Status: DC

## 2024-06-13 MED ORDER — ALBUMIN HUMAN 25 % IV SOLN
0.0000 g | Freq: Once | INTRAVENOUS | Status: DC
  Filled 2024-06-13: qty 400

## 2024-06-13 MED ORDER — VANCOMYCIN HCL IN DEXTROSE 1-5 GM/200ML-% IV SOLN
1000.0000 mg | Freq: Once | INTRAVENOUS | Status: AC
  Administered 2024-06-13: 1000 mg via INTRAVENOUS
  Filled 2024-06-13: qty 200

## 2024-06-13 MED ORDER — SODIUM CHLORIDE 0.9 % IV SOLN
2.0000 g | Freq: Once | INTRAVENOUS | Status: AC
  Administered 2024-06-13: 2 g via INTRAVENOUS
  Filled 2024-06-13: qty 12.5

## 2024-06-13 MED ORDER — LACTATED RINGERS IV SOLN: INTRAVENOUS | Status: DC

## 2024-06-13 MED ORDER — SODIUM CHLORIDE 0.9 % IV BOLUS
1000.0000 mL | Freq: Once | INTRAVENOUS | Status: AC
  Administered 2024-06-13: 1000 mL via INTRAVENOUS

## 2024-06-13 MED ORDER — LACTATED RINGERS IV BOLUS
1000.0000 mL | Freq: Once | INTRAVENOUS | Status: AC
Start: 2024-06-13 — End: 2024-06-14
  Administered 2024-06-13: 1000 mL via INTRAVENOUS

## 2024-06-13 MED ORDER — INSULIN ASPART 100 UNIT/ML IJ SOLN: 0.0000 [IU] | Freq: Three times a day (TID) | INTRAMUSCULAR | Status: DC

## 2024-06-13 MED ORDER — SODIUM CHLORIDE 0.9 % IV SOLN
3.0000 g | Freq: Two times a day (BID) | INTRAVENOUS | Status: DC
  Administered 2024-06-14 (×2): 3 g via INTRAVENOUS
  Filled 2024-06-13 (×2): qty 8

## 2024-06-13 NOTE — Assessment & Plan Note (Addendum)
 June 13, 2024 due to her acute kidney injury and sepsis.  Continue with IV fluids.  She is received 2 L of IV fluids in ER.  Start p.o. bicarbonate 650 mg twice daily.  Repeat chemistry in the morning.

## 2024-06-13 NOTE — Hospital Course (Signed)
 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  She was discharged from the hospital on 06/09/2024 after spending about a week in the hospital due to an upper GI bleed.  She had a upper endoscopy on 06/04/2024 that showed clotted blood in the gastric body.  There was diffuse erythematous mucosa found within the cardia, gastric fundus, greater body and essentially entire stomach.  She was discharged to home on Protonix  and Carafate .  Patient states that the Carafate  is making her nauseous.  She had several bouts of nausea and vomiting on 06/12/2027.  Patient is pretty confident she aspirated when she was vomiting.  She has had some diarrhea today but none in the ER.  Patient was initially going to be placed back at [facility] nursing home when she was discharged but due to no available nursing home days left on her insurance, the family decided to take her home.  She is currently residing with her daughter Annabella.  EMS was called out to the house today due to the patient's abdominal pain.  Blood pressure recorded by EMS was 122/74 heart rate 52 pulse oximetry 94% on room air.  Arrival to the ER, temp 97.6 heart rate 95 initial blood pressure was 86/47.  She was given 2 L of IV fluids with good response in her blood pressure.  White count 25.1, hemoglobin 11.1, platelets of 287  Sodium 134, potassium 5.4, bicarb 17, BUN of 48, creatinine 2.16, calcium  8.3  Total protein 3.8, albumin  less than 1.5, AST of 111, ALT 42, alk phos 388, total bili 0.8  COVID-negative, RSV negative, influenza negative  Lipase of 16  Portable chest x-ray showed right lower lobe consolidation  CT chest abdomen  pelvis demonstrated right lower lobe and right middle lobe consolidation.  Small bilateral pleural effusions.  Large volume ascites.  Diffuse body wall edema.  Questionable wall thickening of the ascending and transverse colon.  There is a right sacral decubitus ulcer with cortical irregularity.    Patient given cefepime  and vancomycin .  [facility] hospitalist consulted for admission.  Significant Events: Admitted 06/13/2024 for aspiration pneumonia. 06-14-2024 hypotensive while getting paracentesis. Paracentesis cell count suggestive of spontaneous bacterial peritonitis. PCCM consulted due to persistent hypotension despite IVF and IV colloid resuscitation 06-15-2024 palliative care consulted. Pt may DNR/Comfort care. Oncology agrees with comfort care plan. Started on IV dilaudid   Admission Labs: White count 25.1, hemoglobin 11.1, platelets of 287 Sodium 134, potassium 5.4, bicarb 17, BUN of 48, creatinine 2.16, calcium  8.3 Total protein 3.8, albumin  less than 1.5, AST of 111, ALT 42, alk phos 388, total bili 0.8 COVID-negative, RSV negative, influenza negative Lipase of 16  Admission Imaging Studies: Portable chest x-ray showed right lower lobe consolidation CT chest abdomen pelvis demonstrated right lower lobe and right middle lobe consolidation.  Small bilateral pleural effusions.  Large volume ascites.  Diffuse body wall edema.  Questionable wall thickening of the ascending and transverse colon.  There is a right sacral decubitus ulcer with cortical irregularity.    Significant Labs: Paracentesis cell count. WBC 454. 81% neutrophil. Absolute  neutrophil count 367 Paracentesis cytology negative for malignant cells  Significant Imaging Studies:   Antibiotic Therapy: Anti-infectives (From admission, onward)    Start  Dose/Rate Route Frequency Ordered Stop   06/13/24 1915  vancomycin  (VANCOCIN ) IVPB 1000 mg/200 mL premix        1,000 mg 200 mL/hr over 60 Minutes Intravenous  Once  06/13/24 1913 06/13/24 2105   06/13/24 1915  ceFEPIme  (MAXIPIME ) 2 g in sodium chloride  0.9 % 100 mL IVPB        2 g 200 mL/hr over 30 Minutes Intravenous  Once 06/13/24 1913 06/13/24 1955       Procedures: paracentesis  Consultants: PCCM Palliative care

## 2024-06-13 NOTE — H&P (Addendum)
 History and Physical    [name] FMW:[id number] DOB: May 22, 1941 DOA: 06/13/2024  DOS: the patient was seen and examined on 06/13/2024  PCP: Chrystal Lamarr RAMAN, MD   Patient coming from: Home  I have personally briefly reviewed patient's old medical records in 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  She was discharged from the hospital on 06/09/2024 after spending about a week in the hospital due to an upper GI bleed.  She had a upper endoscopy on 06/04/2024 that showed clotted blood in the gastric body.  There was diffuse erythematous mucosa found within the cardia, gastric fundus, greater body and essentially entire stomach.  She was discharged to home on Protonix  and Carafate .  Patient states that the Carafate  is making her nauseous.  She had several bouts of nausea and vomiting on 06/12/2027.  Patient is pretty confident she aspirated when she was vomiting.  She has had some diarrhea today but none in the ER.  Patient was initially going to be placed back at [facility] nursing home when she was discharged but due to no available nursing home days left on her insurance, the family decided to take her home.  She is currently residing with her daughter [name].  EMS was called out to the house today due to the patient's abdominal pain.  Blood pressure recorded by EMS was 122/74 heart rate 52 pulse oximetry 94% on room air.  Arrival to the ER, temp 97.6 heart rate 95 initial blood pressure was 86/47.  She was given 2 L of IV fluids with good response in her blood pressure.  White count 25.1, hemoglobin 11.1, platelets of 287  Sodium 134, potassium  5.4, bicarb 17, BUN of 48, creatinine 2.16, calcium  8.3  Total protein 3.8, albumin  less than 1.5, AST of 111, ALT 42, alk phos 388, total bili 0.8  COVID-negative, RSV negative, influenza negative  Lipase of 16  Portable chest x-ray showed right lower lobe consolidation  CT chest abdomen pelvis demonstrated right lower lobe and right middle lobe consolidation.  Small bilateral pleural effusions.  Large volume ascites.  Diffuse body wall edema.  Questionable wall thickening of the ascending and transverse colon.  There is a right sacral decubitus ulcer with cortical irregularity.    Patient given cefepime  and vancomycin .  [facility] hospitalist consulted for admission.  Significant Events: Admitted 06/13/2024 for aspiration pneumonia.   Admission Labs: White count 25.1, hemoglobin 11.1, platelets of 287 Sodium 134, potassium 5.4, bicarb 17, BUN of 48, creatinine 2.16, calcium  8.3 Total protein 3.8, albumin  less than 1.5, AST of 111, ALT 42, alk phos 388, total bili 0.8 COVID-negative, RSV negative, influenza negative Lipase of 16  Admission Imaging Studies: Portable chest x-ray showed right lower lobe consolidation CT chest abdomen pelvis demonstrated right lower lobe and right middle lobe consolidation.  Small bilateral pleural effusions.  Large volume ascites.  Diffuse body wall edema.  Questionable wall thickening of the ascending and transverse colon.  There is a right sacral decubitus ulcer with cortical irregularity.    Significant Labs:   Significant Imaging Studies:   Antibiotic Therapy: Anti-infectives (From admission, onward)    Start     Dose/Rate Route Frequency Ordered Stop  06/13/24 1915  vancomycin  (VANCOCIN ) IVPB 1000 mg/200 mL premix        1,000 mg 200 mL/hr over 60 Minutes Intravenous  Once 06/13/24 1913 06/13/24 2105   06/13/24 1915  ceFEPIme  (MAXIPIME ) 2 g in sodium chloride  0.9 % 100 mL IVPB        2 g 200 mL/hr over 30 Minutes Intravenous  Once 06/13/24  1913 06/13/24 1955       Procedures:   Consultants:    ED Course: pt given IVF. Blood cx obtained. Given IV cefepime  and vanco  Review of Systems:  Review of Systems  Constitutional: Negative.   HENT: Negative.    Eyes: Negative.   Respiratory:  Positive for shortness of breath.   Cardiovascular: Negative.   Gastrointestinal:  Positive for abdominal pain and vomiting.  Genitourinary: Negative.   Musculoskeletal:  Positive for back pain.  Skin: Negative.   Neurological: Negative.   Endo/Heme/Allergies:        Poor appetite, earlier satiety  Psychiatric/Behavioral: Negative.    All other systems reviewed and are negative.   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   Cholelithiasis 10/12/2022   Chronic kidney disease, stage 3a (HCC) 04/14/2024   Complication of anesthesia    VERSED - agitated, muscle spasms, jerking , frantic , (never had this occurence in the pas)    Coronary artery disease    Critical lower limb ischemia (HCC) 08/03/2019   Critical limb ischemia     Diabetes mellitus without complication (HCC)    Dysrhythmia    PVC's   Family history of malignant neoplasm of digestive organ 02/17/2022   Gangrene of foot (HCC) 08/07/2019   GERD (gastroesophageal reflux disease)    Heart murmur    History of hiatal hernia    Hyperlipidemia    Hypertension 11/20/2011   ECHO- EF>55% Borderline concentric left ventricular hypertrophy. There is a small calcified mass in the L:A near the LA appendage. No valvular masses seen  with associated mitral annular calcification. LA Volume/ BSA27.4 ml/m2 No AS. Right ventricular systolic pressure is elevated at .   Jaundice    Myocardial infarction [facility])    June 2021   Neuromuscular disorder [facility])    neuropathy in bilateral feet   NSTEMI (non-ST elevated myocardial infarction) (HCC) 03/22/2020   Osteomyelitis of foot, left, acute (HCC) 04/27/2021   Other acute osteomyelitis, left ankle and foot (HCC) 02/17/2022   Peripheral vascular disease (HCC)    Pneumonia    not hosp.    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   Symptomatic cholelithiasis 10/12/2022    Past Surgical History:  Procedure Laterality Date   ABDOMINAL AORTAGRAM  11/25/2019   ABDOMINAL AORTOGRAM W/LOWER EXTREMITY    ABDOMINAL AORTOGRAM N/A 06/15/2019   Procedure: ABDOMINAL AORTOGRAM;  Surgeon: Court,  Dorn PARAS, MD;  [facility];  Service: Cardiovascular;  Laterality: N/A;   ABDOMINAL AORTOGRAM W/LOWER EXTREMITY N/A 11/25/2019   Procedure: ABDOMINAL AORTOGRAM W/LOWER EXTREMITY;  Surgeon: Darron Deatrice LABOR, MD;  [facility]: [facility];  Service: Cardiovascular;  Laterality: N/A;  Lt leg   ABDOMINAL AORTOGRAM W/LOWER EXTREMITY N/A 08/01/2020   Procedure: ABDOMINAL AORTOGRAM W/LOWER EXTREMITY;  Surgeon: Court Dorn PARAS, MD;  [facility]: [facility];  Service: Cardiovascular;  Laterality: N/A;   ABDOMINAL AORTOGRAM W/LOWER EXTREMITY N/A 04/28/2021   Procedure: ABDOMINAL AORTOGRAM W/LOWER EXTREMITY;  Surgeon: Harvey Carlin BRAVO, MD;  [facility];  Service: Cardiovascular;  Laterality: N/A;   ABDOMINAL AORTOGRAM W/LOWER EXTREMITY N/A 10/13/2021   Procedure: ABDOMINAL AORTOGRAM W/LOWER EXTREMITY;  Surgeon: Magda Debby SAILOR, MD;  [facility];  Service: Cardiovascular;  Laterality: N/A;    ABDOMINAL AORTOGRAM W/LOWER EXTREMITY N/A 11/09/2022   Procedure: ABDOMINAL AORTOGRAM W/LOWER EXTREMITY;  Surgeon: Magda Debby SAILOR, MD;  [facility];  Service: Cardiovascular;  Laterality: N/A;   ABDOMINAL AORTOGRAM W/LOWER EXTREMITY N/A 11/16/2022   Procedure: ABDOMINAL AORTOGRAM W/LOWER EXTREMITY;  Surgeon: Magda Debby SAILOR, MD;  [facility];  Service: Cardiovascular;  Laterality: N/A;   AMPUTATION TOE Left 05/03/2021   Procedure: AMPUTATION OF THIRD LEFT  TOE;  Surgeon: Magda Debby SAILOR, MD;  Location: [facility] OR;  Service: Vascular;  Laterality: Left;   APPENDECTOMY     APPLICATION OF WOUND VAC Right 07/21/2019   Procedure: Application Of Prevena Wound Vac Right Groin;  Surgeon: Serene Gaile ORN, MD;  Location: [facility] OR;  Service: Vascular;  Laterality: Right;   BILIARY BRUSHING  04/07/2023   Procedure: BILIARY BRUSHING;  Surgeon: Rosalie Kitchens, MD;  Location: [facility] ENDOSCOPY;  Service: Gastroenterology;;   BILIARY STENT PLACEMENT  04/07/2023   Procedure: BILIARY STENT PLACEMENT;  Surgeon: Rosalie Kitchens, MD;  Location: [facility] ENDOSCOPY;  Service: Gastroenterology;;   CARPAL TUNNEL RELEASE Left    CARPAL TUNNEL RELEASE Right    CESAREAN SECTION     x 2   CHOLECYSTECTOMY N/A 10/12/2022   Procedure: LAPAROSCOPIC PARTIAL FENESTRATING CHOLECYSTECTOMY;  Surgeon: Dasie Leonor CROME, MD;  [facility];  Service: General;  Laterality: N/A;   COLONOSCOPY     CORONARY ARTERY BYPASS GRAFT N/A 03/24/2020   Procedure: CORONARY ARTERY BYPASS GRAFTING (CABG) using LIMA to LAD (m); RIMA to RAMUS; Endoscopic Right Greater Saphenous Vein: SVG to Diag1; SVG to PLB (right); and SVG to PL (left).;  Surgeon: German Bartlett PEDLAR, MD;  [facility];  Service: Open Heart Surgery;  Laterality: N/A;  BILATERAL IMA   ENDARTERECTOMY FEMORAL Right 07/21/2019   Procedure: RIGHT ENDARTERECTOMY FEMORAL WITH PATCH ANGIOPLASTY;  Surgeon: Serene Gaile ORN, MD;  [facility];  Service: Vascular;  Laterality:  Right;   ENDARTERECTOMY FEMORAL Right 08/16/2020   Procedure: RIGHT FEMORAL ENDARTERECTOMY;  Surgeon: Sheree Penne Bruckner, MD;  Location: [facility] OR;  Service: Vascular;  Laterality: Right;   ENDOVEIN HARVEST OF GREATER SAPHENOUS VEIN Right 03/24/2020   Procedure: ENDOVEIN HARVEST OF GREATER SAPHENOUS VEIN;  Surgeon: German Bartlett PEDLAR, MD;  [facility];  Service: Open Heart Surgery;  Laterality: Right;   ERCP N/A 04/07/2023   Procedure: ENDOSCOPIC RETROGRADE CHOLANGIOPANCREATOGRAPHY (ERCP);  Surgeon: Rosalie Kitchens, MD;  Location: [facility] ENDOSCOPY;  Service: Gastroenterology;  Laterality: N/A;   ESOPHAGOGASTRODUODENOSCOPY N/A 06/04/2024   Procedure: EGD (ESOPHAGOGASTRODUODENOSCOPY);  Surgeon: Saintclair Jasper, MD;  Location: [facility];  Service: Gastroenterology;  Laterality: N/A;   EYE SURGERY     cataract removal bilaterally   FEMORAL-POPLITEAL BYPASS GRAFT Right 08/16/2020   Procedure: BYPASS GRAFT FEMORAL-POPLITEAL ARTERY;  Surgeon: Sheree Penne Bruckner, MD;  Location: [facility] OR;  Service: Vascular;  Laterality: Right;   FEMORAL-POPLITEAL BYPASS GRAFT Left 05/03/2021   Procedure: LEFT FEMORAL TO BELOW KNEE POPLITEAL ARTERY BYPASS GRAFTING WITH 6MMX80 PTFE GRAFT;  Surgeon: Magda Debby SAILOR, MD;  [facility];  Service: Vascular;  Laterality: Left;   LEFT HEART CATH AND CORONARY ANGIOGRAPHY N/A 03/22/2020   Procedure: LEFT HEART CATH AND CORONARY ANGIOGRAPHY;  Surgeon: [place], Peter M, MD;  Location: [facility] INVASIVE CV LAB;  Service: Cardiovascular;  Laterality: N/A;   LOWER EXTREMITY ANGIOGRAPHY Bilateral 06/15/2019   Procedure: Lower Extremity Angiography;  Surgeon: Court Dorn PARAS, MD;  Location: [facility] INVASIVE CV LAB;  Service: Cardiovascular;  Laterality: Bilateral;   LOWER EXTREMITY ANGIOGRAPHY Right 08/03/2019   Procedure: LOWER EXTREMITY ANGIOGRAPHY;  Surgeon: Court Dorn PARAS, MD;  [facility];  Service: Cardiovascular;  Laterality: Right;   LOWER EXTREMITY ANGIOGRAPHY N/A  09/07/2021   Procedure: LOWER EXTREMITY ANGIOGRAPHY;  Surgeon: Gretta Bruckner PARAS, MD;  [facility];  Service: Cardiovascular;  Laterality: N/A;   PATCH ANGIOPLASTY Right 07/21/2019   Procedure: Patch Angioplasty Right Femoral Artery;  Surgeon: Serene Gaile ORN, MD;  Location: [facility] OR;  Service: Vascular;  Laterality: Right;   PERIPHERAL INTRAVASCULAR LITHOTRIPSY  11/25/2019   Procedure: INTRAVASCULAR LITHOTRIPSY;  Surgeon: Darron Deatrice LABOR, MD;  [facility];  Service: Cardiovascular;;  LT. SFA   PERIPHERAL VASCULAR ATHERECTOMY  08/03/2019   Procedure: PERIPHERAL VASCULAR ATHERECTOMY;  Surgeon: Court Dorn PARAS, MD;  Location: [facility] INVASIVE CV LAB;  Service: Cardiovascular;;  right SFA, right TP trunk   PERIPHERAL VASCULAR ATHERECTOMY  11/25/2019   Procedure: PERIPHERAL VASCULAR ATHERECTOMY;  Surgeon: Darron Deatrice LABOR, MD;  [facility];  Service: Cardiovascular;;  Lt.  POPLITEAL and AT   PERIPHERAL VASCULAR BALLOON ANGIOPLASTY Left 06/15/2019   Procedure: PERIPHERAL VASCULAR BALLOON ANGIOPLASTY;  Surgeon: Court Dorn PARAS, MD;  [facility];  Service: Cardiovascular;  Laterality: Left;  SFA UNSUCCESSFUL UNABLE TO CROSS LESION   PERIPHERAL VASCULAR BALLOON ANGIOPLASTY  08/03/2019   Procedure: PERIPHERAL VASCULAR BALLOON ANGIOPLASTY;  Surgeon: Court Dorn PARAS, MD;  [facility];  Service: Cardiovascular;;  right SFA, Right TP trunk   PERIPHERAL VASCULAR BALLOON ANGIOPLASTY  09/07/2021   Procedure: PERIPHERAL VASCULAR BALLOON ANGIOPLASTY;  Surgeon: Gretta Bruckner PARAS, MD;  [facility];  Service: Cardiovascular;;   PERIPHERAL VASCULAR BALLOON ANGIOPLASTY Right 10/13/2021   Procedure: PERIPHERAL VASCULAR BALLOON ANGIOPLASTY;  Surgeon: Magda Debby SAILOR, MD;  [facility];  Service: Cardiovascular;  Laterality: Right;   PERIPHERAL VASCULAR BALLOON ANGIOPLASTY  11/09/2022   Procedure:  PERIPHERAL VASCULAR BALLOON ANGIOPLASTY;  Surgeon: Magda Debby SAILOR, MD;  [facility];  Service: Cardiovascular;;  fem-pop bypass and AT   PORTACATH PLACEMENT Left 04/29/2023   Procedure: INSERTION PORT-A-CATH;  Surgeon: Dasie Leonor CROME, MD;  Location: [facility] ORS;  Service: General;  Laterality: Left;   PORTACATH PLACEMENT N/A 07/12/2023   Procedure: REVISION PORT-A-CATH;  Surgeon: Dasie Leonor CROME, MD;  Location: [facility] OR;  Service: General;  Laterality: N/A;   REMOVAL OF STONES  04/07/2023   Procedure: REMOVAL OF STONES;  Surgeon: Rosalie Kitchens, MD;  Location: [facility] ENDOSCOPY;  Service: Gastroenterology;;   ANNETT  04/07/2023  Procedure: SPHINCTEROTOMY;  Surgeon: Rosalie Kitchens, MD;  Location: [facility] ENDOSCOPY;  Service: Gastroenterology;;   TEE WITHOUT CARDIOVERSION N/A 03/24/2020   Procedure: TRANSESOPHAGEAL ECHOCARDIOGRAM (TEE);  Surgeon: German Bartlett PEDLAR, MD;  Location: [facility] OR;  Service: Open Heart Surgery;  Laterality: N/A;   TONSILLECTOMY     and adenoidectomy   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/2019   Procedure: TRANSMETATARSAL AMPUTATION;  Surgeon: Gershon Donnice SAUNDERS, DPM;  [facility];  Service: Podiatry;  Laterality: Right;   TRANSMETATARSAL AMPUTATION Left 05/11/2021   Procedure: LEFT TRANSMETATARSAL AMPUTATION;  Surgeon: Sheree Penne Bruckner, MD;  Location: [facility] OR;  Service: Vascular;  Laterality: Left;   TUBAL LIGATION       reports that she quit smoking about 52 years ago. Her smoking use included cigarettes. She has never used smokeless tobacco. She reports that she does not drink alcohol  and does not use drugs.  Allergies  Allergen Reactions   Versed  [Midazolam ] Anxiety and Other (See Comments)    Panic Agitation    Bactrim  [Sulfamethoxazole -Trimethoprim ] Other (See Comments)    ^ K+( elevated)    Augmentin [Amoxicillin-Pot Clavulanate] Other (See Comments)    Elevates potassium level   Demerol [Meperidine] Other (See Comments)    Delusions    Transderm-Scop [Scopolamine] Other (See Comments)    Delusions   Ultram  [Tramadol ] Other (See Comments)    Insomnia     Family History  Problem Relation Age of Onset   Cancer - Prostate Father    Cancer - Colon Father    Stroke Mother    Hypertension Mother    Hyperlipidemia Mother    Melanoma Brother     Prior to Admission medications   Medication Sig Start Date End Date Taking? Authorizing Provider  acetaminophen  (TYLENOL ) 500 MG tablet Take 1,000 mg by mouth every 6 (six) hours as needed for moderate pain (pain score 4-6), fever or mild pain (pain score 1-3).    [provider]  brimonidine  (ALPHAGAN ) 0.2 % ophthalmic solution Place 1 drop into the right eye 2 (two) times daily. 01/19/21   [provider]  ferrous sulfate  325 (65 FE) MG EC tablet Take 1 tablet (325 mg total) by mouth daily before breakfast. To be taken with a cup of orange juice on an otherwise empty stomach if possible Patient taking differently: Take 325 mg by mouth daily. 04/16/24 04/16/25  Shalhoub, Zachary PARAS, MD  furosemide  (LASIX ) 20 MG tablet Take 1 tablet (20 mg total) by mouth daily. Patient not taking: Reported on 06/03/2024 06/01/24   Lanny Callander, MD  Insulin  Lispro Prot & Lispro (HUMALOG  MIX 75/25 KWIKPEN) (75-25) 100 UNIT/ML Kwikpen Inject 0-6 Units into the skin daily as needed (hyperglycemia).    [provider]  JARDIANCE  25 MG TABS tablet Take 25 mg by mouth daily. 04/20/24   [provider]  magnesium  oxide (MAG-OX) 400 (240 Mg) MG tablet Take 400 mg by mouth 2 (two) times daily.    [provider]  metFORMIN  (GLUCOPHAGE ) 1000 MG tablet Take 1,000 mg by mouth 2 (two) times daily.    [provider]  metoprolol  tartrate (LOPRESSOR ) 25 MG tablet Take 1 tablet (25 mg total) by mouth 2 (two) times daily. 02/18/24   Krishnan, Gokul, MD  mirtazapine  (REMERON ) 15 MG tablet Take 15 mg by mouth at bedtime.    [provider]  nystatin (MYCOSTATIN/NYSTOP)  powder Apply 1 Application topically 2 (two) times daily as needed (skin irritation).    [provider]  pantoprazole  (PROTONIX )  40 MG tablet Take 1 tablet (40 mg total) by mouth 2 (two) times daily. 06/09/24 06/09/25  Jadine Toribio SQUIBB, MD  polyethylene glycol (MIRALAX  / GLYCOLAX ) 17 g packet Take 17 g by mouth daily. Patient taking differently: Take 17 g by mouth daily as needed (constipation). 02/19/24   Krishnan, Gokul, MD  Propylene Glycol (SYSTANE BALANCE) 0.6 % SOLN Place 1 drop into the left eye 3 (three) times daily as needed (dry/irritated eyes).    [provider]  rosuvastatin  (CRESTOR ) 40 MG tablet Take 40 mg by mouth at bedtime. 05/01/24   [provider]  sucralfate  (CARAFATE ) 1 g tablet Take 1 tablet (1 g total) by mouth 4 (four) times daily. 06/09/24 06/09/25  Jadine Toribio SQUIBB, MD  telmisartan  (MICARDIS ) 40 MG tablet Take 40 mg by mouth daily. 03/27/24   [provider]  traZODone  (DESYREL ) 100 MG tablet Take 100 mg by mouth at bedtime. 03/12/24   [provider]    Physical Exam: Vitals:   06/13/24 2045 06/13/24 2120 06/13/24 2130 06/13/24 2156  BP: (!) 110/49 (!) 123/53 (!) 109/53   Pulse: 90 (!) 101 98   Resp: 12 17 15    Temp:    97.6 F (36.4 C)  TempSrc:    Oral  SpO2: 96% 97% 100%   Weight:      Height:        Physical Exam Vitals and nursing note reviewed.  Constitutional:      General: She is not in acute distress.    Appearance: She is not diaphoretic.     Comments: Elderly female. Chronically ill appearing. No distress. On RA  HENT:     Head: Normocephalic.     Nose: Nose normal.  Eyes:     General: No scleral icterus. Cardiovascular:     Rate and Rhythm: Normal rate and regular rhythm.  Pulmonary:     Effort: Pulmonary effort is normal.     Breath sounds: Examination of the right-middle field reveals rales. Examination of the right-lower field reveals rales. Rales present.  Abdominal:     General: Bowel  sounds are normal.     Palpations: Abdomen is soft.     Tenderness: There is no guarding.     Comments: Bedside abd ultrasound shows moderate ascites LLQ and small ascites in RLQ. Pt was lying left lateral recumbent.  Musculoskeletal:     Right lower leg: Edema present.     Left lower leg: Edema present.     Comments: +2 bilateral pitting edema of lower legs and feet.  Old right transmetatarsal amputation  Skin:    General: Skin is warm and dry.     Capillary Refill: Capillary refill takes less than 2 seconds.  Neurological:     Mental Status: She is oriented to person, place, and time.      Labs on Admission: I have personally reviewed following labs and imaging studies  CBC: Recent Labs  Lab 06/13/24 1845  WBC 25.1*  NEUTROABS 23.4*  HGB 11.1*  HCT 34.1*  MCV 87.7  PLT 287   Basic Metabolic Panel: Recent Labs  Lab 06/13/24 1845  NA 134*  K 5.4*  CL 105  CO2 17*  GLUCOSE 85  BUN 48*  CREATININE 2.16*  CALCIUM  8.3*   GFR: Estimated Creatinine Clearance: 18.1 mL/min (A) (by C-G formula based on SCr of 2.16 mg/dL (H)). Liver Function Tests: Recent Labs  Lab 06/13/24 1845  AST 111*  ALT 42  ALKPHOS 388*  BILITOT 0.8  PROT 3.8*  ALBUMIN  <1.5*   Recent Labs  Lab 06/13/24 1845  LIPASE 16   Coagulation Profile: Recent Labs  Lab 06/13/24 1845  INR 1.3*   CBG: Recent Labs  Lab 06/08/24 1629 06/08/24 1947 06/09/24 0458 06/09/24 0738 06/09/24 1116  GLUCAP 187* 192* 215* 201* 188*   Lactic Acid, Venous    Component Value Date/Time   LATICACIDVEN 2.0 (HH) 06/13/2024 2252   Radiological Exams on Admission: I have personally reviewed images CT CHEST ABDOMEN PELVIS WO CONTRAST Result Date: 06/13/2024 CLINICAL DATA:  Sepsis.  Abdominal pain and hypotension. EXAM: CT CHEST, ABDOMEN AND PELVIS WITHOUT CONTRAST TECHNIQUE: Multidetector CT imaging of the chest, abdomen and pelvis was performed following the standard protocol without IV contrast.  RADIATION DOSE REDUCTION: This exam was performed according to the departmental dose-optimization program which includes automated exposure control, adjustment of the mA and/or kV according to patient size and/or use of iterative reconstruction technique. COMPARISON:  CT chest abdomen and pelvis 06/02/2024. FINDINGS: CT CHEST FINDINGS Cardiovascular: Heart is mildly enlarged. Aorta is normal in size. There are atherosclerotic calcifications of the aorta and arteries. Patient is status post cardiac surgery. There is no pericardial effusion. Left chest port catheter tip ends in the right atrium. Mediastinum/Nodes: No enlarged mediastinal, hilar, or axillary lymph nodes. Thyroid  gland, trachea, and esophagus demonstrate no significant findings. Lungs/Pleura: There small bilateral pleural effusions, right greater than left. There is airspace consolidation in the right lower lobe inferiorly with bronchograms. There is also small amount airspace consolidation right middle lobe. Atelectatic changes are seen left lower lobe/lung base. No pneumothorax visualized. Musculoskeletal: There is diffuse chest wall edema, particularly right. No acute fractures are seen. Median sternotomy wires are present. CT ABDOMEN PELVIS FINDINGS Hepatobiliary: Biliary drainage catheter is unchanged in position. Small amount of pneumobilia persists. There is no focal liver lesion allowing for lack of intravenous contrast. Gallbladder is not visualized. Pancreas: Unremarkable. No pancreatic ductal dilatation or surrounding inflammatory changes. Spleen: Normal in size without focal abnormality. Adrenals/Urinary Tract: There is a 3 mm calculus in the right kidney. Extensive vascular calcifications are present. There is no hydronephrosis in either kidney. Adrenal glands and bladder are within normal limits. Stomach/Bowel: There is no bowel obstruction, pneumatosis or free air. There some questionable wall thickening of the ascending colon and  transverse colon. The appendix is not visualized. Small bowel and stomach are within normal limits. Vascular/Lymphatic: There are extensive vascular calcifications present. Aorta and IVC are normal in size. Iliac vascular stents are present. No enlarged lymph nodes are seen. Reproductive: Ovaries and adnexa are within normal limits. Other: There is large volume ascites. There is diffuse body wall edema. There is no focal abdominal wall hernia. Musculoskeletal: Single rounded lucent lesion in the L5 vertebral body appears unchanged. There are degenerative changes of both hips. Right sacral decubitus ulcer is present there is mild underlying cortical irregularity. IMPRESSION: 1. Right lower lobe and right middle lobe airspace consolidation worrisome for pneumonia. 2. Small bilateral pleural effusions, right greater than left. 3. Large volume ascites. 4. Diffuse body wall edema. 5. Questionable wall thickening of the ascending and transverse colon. Correlate clinically for colitis. 6. Nonobstructing right renal calculus. 7. Right sacral decubitus ulcer with mild underlying cortical irregularity. Correlate clinically for osteomyelitis. 8. Stable single lucent lesion in the L5 vertebral body. 9. Aortic atherosclerosis. Aortic Atherosclerosis (ICD10-I70.0). Electronically Signed   By: Greig Pique M.D.   On: 06/13/2024 21:23   DG Chest Portable 1 View  Result Date: 06/13/2024 CLINICAL DATA:  Questionable pneumonia.  Abdominal pain. EXAM: PORTABLE CHEST 1 VIEW COMPARISON:  06/02/2024 FINDINGS: Left Port-A-Cath remains in place, unchanged. Prior CABG. Consolidation in the right lower lobe compatible with pneumonia. No focal opacity on the left. Heart and mediastinal contours are stable. IMPRESSION: Right lower lobe consolidation compatible with pneumonia. Electronically Signed   By: Franky Crease M.D.   On: 06/13/2024 19:08    EKG: My personal interpretation of EKG shows: NSR    Assessment/Plan Principal  Problem:   Aspiration pneumonia (HCC) Active Problems:   Sepsis due to pneumonia (HCC)   Acute kidney injury superimposed on stage 3a chronic kidney disease (HCC)   Acute metabolic acidosis   Ascites   Essential hypertension   Type 2 diabetes mellitus with stage 3a chronic kidney disease, without long-term current use of insulin  (HCC)   Gallbladder cancer (HCC)   Paroxysmal atrial fibrillation (HCC)   Sacral osteomyelitis (HCC)   Stage 4 pressure ulcer (HCC)    Assessment and Plan: * Aspiration pneumonia (HCC) June 13, 2024 admit to MedSurg bed.  Given the patient's known and confirmed history of vomiting on June 11, 2024, we will continue IV antibiotic therapy with Unasyn .  Patient has drug allergy listed in her chart to Augmentin that causes hyperkalemia.  Have never seen that happen before.  May need to watch her serum potassium after transition to p.o. Augmentin.  If she does not become hyperkalemic, this drug allergy can be removed from her profile.  Fortunately she is not requiring any supplemental oxygen .   Sepsis due to pneumonia [facility]) June 13, 2024 present on admission.  Fits criteria for sepsis due to elevated white count of 25.1.  Her initial blood pressure was low at 86/47 but did respond to fluids.  She has acute kidney injury with an elevated creatinine of 2.16.  She has sepsis with acute organ dysfunction due to pneumonia.  Ascites June 13, 2024 CT scan shows ascites.  This is confirmed on bedside ultrasound.  Patient complains of early satiety due to feeling full.  She has probably less than 2 L in her abdomen but family is interested in getting this fluid removed.  Patient is agreeable to paracentesis.  Discussed with the family that I will send off the ascites fluid for cytology.  If this shows that she has cancerous cells in her ascitic fluid, this would portend a very poor prognosis.  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  They understand this.  Acute metabolic acidosis June 13, 2024 due to her acute kidney injury and sepsis.  Continue with IV fluids.  She is received 2 L of IV fluids in ER.  Start p.o. bicarbonate 650 mg twice daily.  Repeat chemistry in the morning.  Acute kidney injury superimposed on stage 3a chronic kidney disease (HCC) June 13, 2024 due to poor p.o. intake and continued therapy at home with Lasix , ARB, Jardiance .  Discussed with both daughters that when patient's p.o. intake decreases due to illness, vomiting or diarrhea, that she should hold her Lasix , telmisartan  and Jardiance .  Continue with IV fluids.  Repeat BMP in the morning.  Stage 4 pressure ulcer (HCC) June 13, 2024 Family wants wound care consult to continue packing of her chronic sacral decubitus wound.  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  Patient's had chronic osteo least as far back as June 2025 on CT scan.  Family wants wound care  consult to continue packing of her chronic sacral decubitus wound.  Paroxysmal atrial fibrillation (HCC) June 13, 2024 holding Lopressor  due to sepsis, hypotension on admission.  Will need to restart this if her rate starts to become uncontrolled.  Currently off of systemic anticoagulation due to recent GI bleeding.  Gallbladder cancer [facility]) June 13, 2024 followed by [facility] cancer Center Dr. Lanny.  Patient is currently on maintenance therapy but has not received this in a while due to repeated hospital admissions.  She remains a full code.  Type 2 diabetes mellitus with stage 3a chronic kidney disease, without long-term current use of insulin  (HCC) June 13, 2024 holding Jardiance .  Place her on sliding scale insulin .  Appetite is quite poor according to family.  Place her on sensitive scale.  Essential hypertension June 13, 2024 Blood pressure currently 102/44.  Patient and family both state that this is a normal blood  pressure for her.  Will hold her Lopressor , telmisartan  and Lasix  for now given her acute kidney injury.  Would restart her Lopressor  first if blood pressure allows.   DVT prophylaxis: SCDs Code Status: Full Code Family Communication: discussed with both dtrs at bedside. Tiffany and Clellie Disposition Plan: return home. Verified with dtr Tiffany that she will take pt home at discharge  Consults called: wound care  Admission status: Inpatient, Med-Surg   Camellia Door, DO [facility] 06/13/2024, 11:12 PM

## 2024-06-13 NOTE — ED Triage Notes (Signed)
 Pt here from home via [facility] for abdominal pain.  PT was tx for stomach ulcers 2 days ago, but every time she attempts to take the meds, she vomits.  Denies coffee ground emesis.  C/o diarrhea today.  Cbg 116 Bp 118/68 Hr 74 Sats 98% ra

## 2024-06-13 NOTE — Assessment & Plan Note (Addendum)
 June 13, 2024 holding Lopressor  due to sepsis, hypotension on admission.  Will need to restart this if her rate starts to become uncontrolled.  Currently off of systemic anticoagulation due to recent GI bleeding.

## 2024-06-13 NOTE — Assessment & Plan Note (Addendum)
 June 13, 2024 followed by [facility] cancer Center Dr. Lanny.  Patient is currently on maintenance therapy but has not received this in a while due to repeated hospital admissions.  She remains a full code.   Prior to 06/17/24 pt decided she wanted to pursue comfort care on 06-15-2024. Pt seen by oncology who agreed with pt's decision.

## 2024-06-13 NOTE — ED Provider Notes (Signed)
 [place] EMERGENCY DEPARTMENT AT [facility] Provider Note   CSN: [id number] Arrival date & time: 06/13/24  1750     Patient presents with: Abdominal Pain and Hypotension   [name] is a 83 y.o. female.With a history of cholangiocarcinoma, type 2 diabetes, PAD, AKI and hematemesis who presents to the ED for vomiting She was recently seen in the [facility], ED on September 9 and subsequently admitted for hematemesis with hemoglobin of 6.7 requiring transfusion.  EGD showed nonbleeding gastric ulcers she was discharged home on September 16 and returns today for repeated nausea vomiting and diarrhea.  Unable to keep medications down at home.  She did report some abdominal pain.  EMS noted that she was hypotensive prior to arrival.      Abdominal Pain      Prior to Admission medications   Medication Sig Start Date End Date Taking? Authorizing Provider  acetaminophen  (TYLENOL ) 500 MG tablet Take 1,000 mg by mouth every 6 (six) hours as needed for moderate pain (pain score 4-6), fever or mild pain (pain score 1-3).    [provider]  brimonidine  (ALPHAGAN ) 0.2 % ophthalmic solution Place 1 drop into the right eye 2 (two) times daily. 01/19/21   [provider]  ferrous sulfate  325 (65 FE) MG EC tablet Take 1 tablet (325 mg total) by mouth daily before breakfast. To be taken with a cup of orange juice on an otherwise empty stomach if possible Patient taking differently: Take 325 mg by mouth daily. 04/16/24 04/16/25  Shalhoub, Zachary PARAS, MD  furosemide  (LASIX ) 20 MG tablet Take 1 tablet (20 mg total) by mouth daily. Patient not taking: Reported on 06/03/2024 06/01/24   Lanny Callander, MD  Insulin  Lispro Prot & Lispro (HUMALOG  MIX 75/25 KWIKPEN) (75-25) 100 UNIT/ML Kwikpen Inject 0-6 Units into the skin daily as needed (hyperglycemia).    [provider]  JARDIANCE  25 MG TABS tablet Take 25 mg by mouth daily. 04/20/24   [provider]  magnesium  oxide  (MAG-OX) 400 (240 Mg) MG tablet Take 400 mg by mouth 2 (two) times daily.    [provider]  metFORMIN  (GLUCOPHAGE ) 1000 MG tablet Take 1,000 mg by mouth 2 (two) times daily.    [provider]  metoprolol  tartrate (LOPRESSOR ) 25 MG tablet Take 1 tablet (25 mg total) by mouth 2 (two) times daily. 02/18/24   Krishnan, Gokul, MD  mirtazapine  (REMERON ) 15 MG tablet Take 15 mg by mouth at bedtime.    [provider]  nystatin (MYCOSTATIN/NYSTOP) powder Apply 1 Application topically 2 (two) times daily as needed (skin irritation).    [provider]  pantoprazole  (PROTONIX ) 40 MG tablet Take 1 tablet (40 mg total) by mouth 2 (two) times daily. 06/09/24 06/09/25  Jadine Toribio SQUIBB, MD  polyethylene glycol (MIRALAX  / GLYCOLAX ) 17 g packet Take 17 g by mouth daily. Patient taking differently: Take 17 g by mouth daily as needed (constipation). 02/19/24   Krishnan, Gokul, MD  Propylene Glycol (SYSTANE BALANCE) 0.6 % SOLN Place 1 drop into the left eye 3 (three) times daily as needed (dry/irritated eyes).    [provider]  rosuvastatin  (CRESTOR ) 40 MG tablet Take 40 mg by mouth at bedtime. 05/01/24   [provider]  sucralfate  (CARAFATE ) 1 g tablet Take 1 tablet (1 g total) by mouth 4 (four) times daily. 06/09/24 06/09/25  Jadine Toribio SQUIBB, MD  telmisartan  (MICARDIS ) 40 MG tablet Take 40 mg by mouth daily. 03/27/24  [provider]  traZODone  (DESYREL ) 100 MG tablet Take 100 mg by mouth at bedtime. 03/12/24   [provider]    Allergies: Versed  [midazolam ], Bactrim  [sulfamethoxazole -trimethoprim ], Augmentin [amoxicillin-pot clavulanate], Demerol [meperidine], Transderm-scop [scopolamine], and Ultram  [tramadol ]    Review of Systems  Gastrointestinal:  Positive for abdominal pain.    Updated Vital Signs BP (!) 109/53   Pulse 98   Temp 97.6 F (36.4 C) (Oral)   Resp 15   Ht 5' 5 (1.651 m)   Wt 63.6 kg   SpO2 100%   BMI 23.33 kg/m    Physical Exam Vitals and nursing note reviewed.  HENT:     Head: Normocephalic and atraumatic.  Eyes:     Pupils: Pupils are equal, round, and reactive to light.  Cardiovascular:     Rate and Rhythm: Normal rate and regular rhythm.  Pulmonary:     Effort: Pulmonary effort is normal.     Breath sounds: Normal breath sounds.  Abdominal:     General: Abdomen is protuberant.     Palpations: Abdomen is soft.     Tenderness: There is generalized abdominal tenderness.  Skin:    General: Skin is warm and dry.     Comments: Multiple skin tears over dorsal aspect of right hand, large skin tear over left forearm Sacral cubitus ulcer with packing and dressing in place  Neurological:     Mental Status: She is alert.     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     (all labs ordered are listed, but only abnormal results are displayed) Labs Reviewed  COMPREHENSIVE METABOLIC PANEL WITH GFR - Abnormal; Notable for the following components:      Result Value   Sodium 134 (*)    Potassium 5.4 (*)    CO2 17 (*)    BUN 48 (*)    Creatinine, Ser 2.16 (*)    Calcium  8.3 (*)    Total Protein 3.8 (*)    Albumin  <1.5 (*)    AST 111 (*)    Alkaline Phosphatase 388 (*)    GFR, Estimated 22 (*)    All other components within normal limits  CBC WITH DIFFERENTIAL/PLATELET - Abnormal; Notable for the following components:   WBC 25.1 (*)    Hemoglobin 11.1 (*)    HCT 34.1 (*)    RDW 20.9 (*)    Neutro Abs 23.4 (*)    Lymphs Abs 0.3 (*)    Monocytes Absolute 1.3 (*)    Abs Immature Granulocytes 0.13 (*)    All other components within normal limits  PROTIME-INR - Abnormal; Notable for the following components:   Prothrombin  Time 17.0 (*)    INR 1.3 (*)    All other components within normal limits  APTT - Abnormal; Notable for the following components:   aPTT 43 (*)    All other components within normal limits  RESP PANEL  BY RT-PCR (RSV, FLU A&B, COVID)  RVPGX2  CULTURE, BLOOD (ROUTINE X 2)  CULTURE, BLOOD (ROUTINE X 2)  LIPASE, BLOOD  URINALYSIS, W/ REFLEX TO CULTURE (INFECTION SUSPECTED)  I-STAT CG4 LACTIC ACID, ED  TYPE AND SCREEN    EKG: None  Radiology: CT CHEST ABDOMEN PELVIS WO CONTRAST Result Date: 06/13/2024 CLINICAL DATA:  Sepsis.  Abdominal pain and hypotension. EXAM: CT CHEST, ABDOMEN AND PELVIS WITHOUT CONTRAST TECHNIQUE: Multidetector CT imaging of the chest,  abdomen and pelvis was performed following the standard protocol without IV contrast. RADIATION DOSE REDUCTION: This exam was performed according to the departmental dose-optimization program which includes automated exposure control, adjustment of the mA and/or kV according to patient size and/or use of iterative reconstruction technique. COMPARISON:  CT chest abdomen and pelvis 06/02/2024. FINDINGS: CT CHEST FINDINGS Cardiovascular: Heart is mildly enlarged. Aorta is normal in size. There are atherosclerotic calcifications of the aorta and arteries. Patient is status post cardiac surgery. There is no pericardial effusion. Left chest port catheter tip ends in the right atrium. Mediastinum/Nodes: No enlarged mediastinal, hilar, or axillary lymph nodes. Thyroid  gland, trachea, and esophagus demonstrate no significant findings. Lungs/Pleura: There small bilateral pleural effusions, right greater than left. There is airspace consolidation in the right lower lobe inferiorly with bronchograms. There is also small amount airspace consolidation right middle lobe. Atelectatic changes are seen left lower lobe/lung base. No pneumothorax visualized. Musculoskeletal: There is diffuse chest wall edema, particularly right. No acute fractures are seen. Median sternotomy wires are present. CT ABDOMEN PELVIS FINDINGS Hepatobiliary: Biliary drainage catheter is unchanged in position. Small amount of pneumobilia persists. There is no focal liver lesion allowing for lack  of intravenous contrast. Gallbladder is not visualized. Pancreas: Unremarkable. No pancreatic ductal dilatation or surrounding inflammatory changes. Spleen: Normal in size without focal abnormality. Adrenals/Urinary Tract: There is a 3 mm calculus in the right kidney. Extensive vascular calcifications are present. There is no hydronephrosis in either kidney. Adrenal glands and bladder are within normal limits. Stomach/Bowel: There is no bowel obstruction, pneumatosis or free air. There some questionable wall thickening of the ascending colon and transverse colon. The appendix is not visualized. Small bowel and stomach are within normal limits. Vascular/Lymphatic: There are extensive vascular calcifications present. Aorta and IVC are normal in size. Iliac vascular stents are present. No enlarged lymph nodes are seen. Reproductive: Ovaries and adnexa are within normal limits. Other: There is large volume ascites. There is diffuse body wall edema. There is no focal abdominal wall hernia. Musculoskeletal: Single rounded lucent lesion in the L5 vertebral body appears unchanged. There are degenerative changes of both hips. Right sacral decubitus ulcer is present there is mild underlying cortical irregularity. IMPRESSION: 1. Right lower lobe and right middle lobe airspace consolidation worrisome for pneumonia. 2. Small bilateral pleural effusions, right greater than left. 3. Large volume ascites. 4. Diffuse body wall edema. 5. Questionable wall thickening of the ascending and transverse colon. Correlate clinically for colitis. 6. Nonobstructing right renal calculus. 7. Right sacral decubitus ulcer with mild underlying cortical irregularity. Correlate clinically for osteomyelitis. 8. Stable single lucent lesion in the L5 vertebral body. 9. Aortic atherosclerosis. Aortic Atherosclerosis (ICD10-I70.0). Electronically Signed   By: Greig Pique M.D.   On: 06/13/2024 21:23   DG Chest Portable 1 View Result Date:  06/13/2024 CLINICAL DATA:  Questionable pneumonia.  Abdominal pain. EXAM: PORTABLE CHEST 1 VIEW COMPARISON:  06/02/2024 FINDINGS: Left Port-A-Cath remains in place, unchanged. Prior CABG. Consolidation in the right lower lobe compatible with pneumonia. No focal opacity on the left. Heart and mediastinal contours are stable. IMPRESSION: Right lower lobe consolidation compatible with pneumonia. Electronically Signed   By: Franky Crease M.D.   On: 06/13/2024 19:08     Procedures   Medications Ordered in the ED  sodium chloride  0.9 % bolus 1,000 mL (0 mLs Intravenous Stopped 06/13/24 2028)  vancomycin  (VANCOCIN ) IVPB 1000 mg/200 mL premix (0 mg Intravenous Stopped 06/13/24 2105)  ceFEPIme  (MAXIPIME ) 2 g in sodium  chloride 0.9 % 100 mL IVPB (0 g Intravenous Stopped 06/13/24 1955)  lactated ringers  bolus 1,000 mL (1,000 mLs Intravenous New Bag/Given 06/13/24 2041)    Clinical Course as of 06/13/24 2223  Sat Jun 13, 2024  2209 Laboratory workup notable for significant leukocytosis of 25.1 AKI with hyperkalemia without EKG changes.  CT and chest x-ray show right middle lobe right lower lobe pneumonia.  Does have a history of chronic sacral decubitus ulcer with known sacral osteomyelitis which is redemonstrated here on CT abdomen pelvis.  Most likely etiology of patient's presentation would be acute infection with healthcare associated pneumonia however could consider acute on chronic osteomyelitis if her symptoms are not improving with resolution of the pneumonia.  Covered for healthcare associated pneumonia with cefepime  and Vanco.  Discussed admitting hospitalist accepts patient for admission [MP]  2209 . [MP]    Clinical Course User Index [MP] Pamella Ozell LABOR, DO                                 Medical Decision Making 83 year old female history presented to ED given concern for nausea vomiting abdominal pain diarrhea hypotension.  Recently admitted to [facility] discharge as above for GI bleeding.   EGD showed nonbleeding ulcers at that time.  No hematemesis today but did have some vomiting diarrhea.  Hypotensive on arrival slightly tachycardic.  Provide IV fluids for rehydration and obtain infectious workup.  Most likely etiology would be progression of known cholangiocarcinoma but need to consider acute infection such as pneumonia, UTI, intra-abdominal infection or skin soft tissue infection in light of sacral decubitus ulcer.  Readmission likely.  Amount and/or Complexity of Data Reviewed Labs: ordered. Radiology: ordered.  Risk Prescription drug management. Decision regarding hospitalization.        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

## 2024-06-13 NOTE — Assessment & Plan Note (Addendum)
 June 13, 2024 due to poor p.o. intake and continued therapy at home with Lasix , ARB, Jardiance .  Discussed with both daughters that when patient's p.o. intake decreases due to illness, vomiting or diarrhea, that she should hold her Lasix , telmisartan  and Jardiance .  Continue with IV fluids.  Repeat BMP in the morning.   Prior to 06/17/24 due to bouts of hypotension, pt given multiple boluses of IVF and IV albumin . PCCM consulted but pt did not meet criteria to move to ICU.

## 2024-06-13 NOTE — Assessment & Plan Note (Addendum)
 June 13, 2024 holding Jardiance .  Place her on sliding scale insulin .  Appetite is quite poor according to family.  Place her on sensitive scale.

## 2024-06-13 NOTE — Assessment & Plan Note (Addendum)
 June 13, 2024 admit to MedSurg bed.  Given the patient's known and confirmed history of vomiting on June 11, 2024, we will continue IV antibiotic therapy with Unasyn .  Patient has drug allergy listed in her chart to Augmentin that causes hyperkalemia.  Have never seen that happen before.  May need to watch her serum potassium after transition to p.o. Augmentin.  If she does not become hyperkalemic, this drug allergy can be removed from her profile.  Fortunately she is not requiring any supplemental oxygen .  Prior to 06/17/24 pt treated with IV ABX. Pt decided she wanted to switch to comfort care measures on 06-15-2024. Palliative care consulted. Started on IV dilaudid . IV abx stopped. Code status changed to DNR/Comfort care.  Pt died at 0220 on 06-26-24.

## 2024-06-13 NOTE — Assessment & Plan Note (Addendum)
 June 13, 2024 Blood pressure currently 102/44.  Patient and family both state that this is a normal blood pressure for her.  Will hold her Lopressor , telmisartan  and Lasix  for now given her acute kidney injury.  Would restart her Lopressor  first if blood pressure allows.

## 2024-06-13 NOTE — ED Notes (Signed)
 CCMD called.

## 2024-06-13 NOTE — Assessment & Plan Note (Addendum)
 June 13, 2024 chronic.  Patient's had chronic osteo least as far back as June 2025 on CT scan.  Family wants wound care consult to continue packing of her chronic sacral decubitus wound.   Prior to 06/17/24 pt seen by wound care specialist. Pt with chronic sacral wound that was present prior to admission.

## 2024-06-13 NOTE — Assessment & Plan Note (Signed)
 June 13, 2024 present on admission.  Fits criteria for sepsis due to elevated white count of 25.1.  Her initial blood pressure was low at 86/47 but did respond to fluids.  She has acute kidney injury with an elevated creatinine of 2.16.  She has sepsis with acute organ dysfunction due to pneumonia.

## 2024-06-13 NOTE — Assessment & Plan Note (Addendum)
 June 13, 2024 CT scan shows ascites.  This is confirmed on bedside ultrasound.  Patient complains of early satiety due to feeling full.  She has probably less than 2 L in her abdomen but family is interested in getting this fluid removed.  Patient is agreeable to paracentesis.  Discussed with the family that I will send off the ascites fluid for cytology.  If this shows that she has cancerous cells in her ascitic fluid, this would portend a very poor prognosis.  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  They understand this.   Prior to 06/17/24 paracentesis performed on 06-14-2024. It showed spontaneous bacterial peritonitis. Pt continued with IV abx until pt decided she wanted to pursue comfort care. IV abx stopped on 9-22-205.

## 2024-06-13 NOTE — Assessment & Plan Note (Addendum)
 June 13, 2024 Family wants wound care consult to continue packing of her chronic sacral decubitus wound.  Prior to 06/17/24 pt seen by wound care specialist. Pt with chronic sacral wound that was present prior to admission.

## 2024-06-14 ENCOUNTER — Inpatient Hospital Stay (HOSPITAL_COMMUNITY)

## 2024-06-14 DIAGNOSIS — N1831 Chronic kidney disease, stage 3a: Secondary | ICD-10-CM

## 2024-06-14 DIAGNOSIS — R627 Adult failure to thrive: Secondary | ICD-10-CM | POA: Diagnosis not present

## 2024-06-14 DIAGNOSIS — M4628 Osteomyelitis of vertebra, sacral and sacrococcygeal region: Secondary | ICD-10-CM

## 2024-06-14 DIAGNOSIS — J690 Pneumonitis due to inhalation of food and vomit: Secondary | ICD-10-CM | POA: Diagnosis not present

## 2024-06-14 DIAGNOSIS — J189 Pneumonia, unspecified organism: Secondary | ICD-10-CM

## 2024-06-14 DIAGNOSIS — R579 Shock, unspecified: Secondary | ICD-10-CM | POA: Diagnosis not present

## 2024-06-14 DIAGNOSIS — N179 Acute kidney failure, unspecified: Secondary | ICD-10-CM

## 2024-06-14 DIAGNOSIS — C23 Malignant neoplasm of gallbladder: Secondary | ICD-10-CM

## 2024-06-14 DIAGNOSIS — I480 Paroxysmal atrial fibrillation: Secondary | ICD-10-CM

## 2024-06-14 DIAGNOSIS — R6520 Severe sepsis without septic shock: Secondary | ICD-10-CM

## 2024-06-14 DIAGNOSIS — E8721 Acute metabolic acidosis: Secondary | ICD-10-CM

## 2024-06-14 DIAGNOSIS — A419 Sepsis, unspecified organism: Secondary | ICD-10-CM

## 2024-06-14 DIAGNOSIS — E1122 Type 2 diabetes mellitus with diabetic chronic kidney disease: Secondary | ICD-10-CM

## 2024-06-14 DIAGNOSIS — L8994 Pressure ulcer of unspecified site, stage 4: Secondary | ICD-10-CM

## 2024-06-14 DIAGNOSIS — I10 Essential (primary) hypertension: Secondary | ICD-10-CM

## 2024-06-14 DIAGNOSIS — R188 Other ascites: Secondary | ICD-10-CM

## 2024-06-14 LAB — URINALYSIS, W/ REFLEX TO CULTURE (INFECTION SUSPECTED)
Bilirubin Urine: NEGATIVE
Glucose, UA: >=500 mg/dL — AB
Hgb urine dipstick: NEGATIVE
Ketones, ur: NEGATIVE mg/dL
Leukocytes,Ua: NEGATIVE
Nitrite: NEGATIVE
Protein, ur: NEGATIVE mg/dL
Specific Gravity, Urine: 1.014 (ref 1.005–1.030)
pH: 5 (ref 5.0–8.0)

## 2024-06-14 LAB — GLUCOSE, CAPILLARY
Glucose-Capillary: 78 mg/dL (ref 70–99)
Glucose-Capillary: 81 mg/dL (ref 70–99)
Glucose-Capillary: 72 mg/dL (ref 70–99)
Glucose-Capillary: 73 mg/dL (ref 70–99)
Glucose-Capillary: 79 mg/dL (ref 70–99)
Glucose-Capillary: 100 mg/dL — ABNORMAL HIGH (ref 70–99)

## 2024-06-14 LAB — CBC WITH DIFFERENTIAL/PLATELET
Abs Immature Granulocytes: 0.13 K/uL — ABNORMAL HIGH (ref 0.00–0.07)
Basophils Absolute: 0.1 K/uL (ref 0.0–0.1)
Basophils Relative: 0 %
Eosinophils Absolute: 0.1 K/uL (ref 0.0–0.5)
Eosinophils Relative: 0 %
HCT: 31.3 % — ABNORMAL LOW (ref 36.0–46.0)
Hemoglobin: 10.2 g/dL — ABNORMAL LOW (ref 12.0–15.0)
Immature Granulocytes: 1 %
Lymphocytes Relative: 1 %
Lymphs Abs: 0.3 K/uL — ABNORMAL LOW (ref 0.7–4.0)
MCH: 28.7 pg (ref 26.0–34.0)
MCHC: 32.6 g/dL (ref 30.0–36.0)
MCV: 87.9 fL (ref 80.0–100.0)
Monocytes Absolute: 1.3 K/uL — ABNORMAL HIGH (ref 0.1–1.0)
Monocytes Relative: 5 %
Neutro Abs: 23.1 K/uL — ABNORMAL HIGH (ref 1.7–7.7)
Neutrophils Relative %: 93 %
Platelets: 264 K/uL (ref 150–400)
RBC: 3.56 MIL/uL — ABNORMAL LOW (ref 3.87–5.11)
RDW: 20.6 % — ABNORMAL HIGH (ref 11.5–15.5)
WBC: 25 K/uL — ABNORMAL HIGH (ref 4.0–10.5)
nRBC: 0 % (ref 0.0–0.2)

## 2024-06-14 LAB — COMPREHENSIVE METABOLIC PANEL WITH GFR
ALT: 39 U/L (ref 0–44)
AST: 98 U/L — ABNORMAL HIGH (ref 15–41)
Albumin: <1.5 g/dL — ABNORMAL LOW (ref 3.5–5.0)
Alkaline Phosphatase: 346 U/L — ABNORMAL HIGH (ref 38–126)
Anion gap: 9 (ref 5–15)
BUN: 47 mg/dL — ABNORMAL HIGH (ref 8–23)
CO2: 17 mmol/L — ABNORMAL LOW (ref 22–32)
Calcium: 8.1 mg/dL — ABNORMAL LOW (ref 8.9–10.3)
Chloride: 108 mmol/L (ref 98–111)
Creatinine, Ser: 2.02 mg/dL — ABNORMAL HIGH (ref 0.44–1.00)
GFR, Estimated: 24 mL/min — ABNORMAL LOW (ref >60)
Glucose, Bld: 87 mg/dL (ref 70–99)
Potassium: 4.7 mmol/L (ref 3.5–5.1)
Sodium: 134 mmol/L — ABNORMAL LOW (ref 135–145)
Total Bilirubin: 0.7 mg/dL (ref 0.0–1.2)
Total Protein: 3.2 g/dL — ABNORMAL LOW (ref 6.5–8.1)

## 2024-06-14 LAB — BODY FLUID CELL COUNT WITH DIFFERENTIAL
Eos, Fluid: 0 %
Lymphs, Fluid: 9 %
Monocyte-Macrophage-Serous Fluid: 10 % — ABNORMAL LOW (ref 50–90)
Neutrophil Count, Fluid: 81 % — ABNORMAL HIGH (ref 0–25)
Total Nucleated Cell Count, Fluid: 454 uL (ref 0–1000)

## 2024-06-14 LAB — ALBUMIN, PLEURAL OR PERITONEAL FLUID

## 2024-06-14 LAB — TYPE AND SCREEN
ABO/RH(D): O POS
Antibody Screen: NEGATIVE

## 2024-06-14 LAB — CK: Total CK: 129 U/L (ref 38–234)

## 2024-06-14 LAB — PROCALCITONIN: Procalcitonin: 6.21 ng/mL

## 2024-06-14 LAB — LACTIC ACID, PLASMA: Lactic Acid, Venous: 1.2 mmol/L (ref 0.5–1.9)

## 2024-06-14 MED ORDER — SODIUM CHLORIDE 0.9 % IV SOLN
2.0000 g | INTRAVENOUS | Status: DC
  Administered 2024-06-14: 2 g via INTRAVENOUS
  Filled 2024-06-14: qty 12.5

## 2024-06-14 MED ORDER — TRAZODONE HCL 50 MG PO TABS
100.0000 mg | ORAL_TABLET | Freq: Every day | ORAL | Status: DC
Start: 2024-06-14 — End: 2024-06-15

## 2024-06-14 MED ORDER — LIDOCAINE HCL (PF) 1 % IJ SOLN
10.0000 mL | Freq: Once | INTRAMUSCULAR | Status: AC
  Administered 2024-06-14: 10 mL

## 2024-06-14 MED ORDER — CHLORHEXIDINE GLUCONATE CLOTH 2 % EX PADS
6.0000 | MEDICATED_PAD | Freq: Every day | CUTANEOUS | Status: DC
  Administered 2024-06-14 – 2024-06-15 (×2): 6 via TOPICAL

## 2024-06-14 MED ORDER — METRONIDAZOLE 500 MG/100ML IV SOLN
500.0000 mg | Freq: Two times a day (BID) | INTRAVENOUS | Status: DC
  Administered 2024-06-14 – 2024-06-15 (×2): 500 mg via INTRAVENOUS
  Filled 2024-06-14 (×2): qty 100

## 2024-06-14 MED ORDER — HEPARIN SODIUM (PORCINE) 5000 UNIT/ML IJ SOLN
5000.0000 [IU] | Freq: Three times a day (TID) | INTRAMUSCULAR | Status: DC
  Administered 2024-06-14 – 2024-06-15 (×3): 5000 [IU] via SUBCUTANEOUS
  Filled 2024-06-14 (×3): qty 1

## 2024-06-14 MED ORDER — INSULIN ASPART 100 UNIT/ML IJ SOLN: 0.0000 [IU] | INTRAMUSCULAR | Status: DC

## 2024-06-14 MED ORDER — MIDODRINE HCL 5 MG PO TABS
5.0000 mg | ORAL_TABLET | Freq: Three times a day (TID) | ORAL | Status: DC
  Administered 2024-06-14 (×2): 5 mg via ORAL
  Filled 2024-06-14 (×2): qty 1

## 2024-06-14 MED ORDER — PANTOPRAZOLE SODIUM 40 MG IV SOLR
40.0000 mg | Freq: Once | INTRAVENOUS | Status: AC
  Administered 2024-06-14: 40 mg via INTRAVENOUS
  Filled 2024-06-14: qty 10

## 2024-06-14 MED ORDER — INFLUENZA VAC SPLIT HIGH-DOSE 0.5 ML IM SUSY
0.5000 mL | PREFILLED_SYRINGE | INTRAMUSCULAR | Status: DC
  Filled 2024-06-14: qty 0.5

## 2024-06-14 MED ORDER — ONDANSETRON HCL 4 MG PO TABS: 4.0000 mg | ORAL_TABLET | Freq: Four times a day (QID) | ORAL | Status: DC | PRN

## 2024-06-14 MED ORDER — ALBUMIN HUMAN 25 % IV SOLN
25.0000 g | INTRAVENOUS | Status: DC
  Administered 2024-06-14 – 2024-06-15 (×6): 25 g via INTRAVENOUS
  Administered 2024-06-15: 12.5 g via INTRAVENOUS
  Administered 2024-06-15: 25 g via INTRAVENOUS
  Filled 2024-06-14 (×8): qty 100

## 2024-06-14 MED ORDER — MEDIHONEY WOUND/BURN DRESSING EX PSTE
1.0000 | PASTE | Freq: Every day | CUTANEOUS | Status: DC
  Administered 2024-06-14: 1 via TOPICAL
  Filled 2024-06-14: qty 44

## 2024-06-14 MED ORDER — ACETAMINOPHEN 325 MG PO TABS
650.0000 mg | ORAL_TABLET | Freq: Four times a day (QID) | ORAL | Status: DC | PRN
  Administered 2024-06-14: 650 mg via ORAL
  Filled 2024-06-14: qty 2

## 2024-06-14 MED ORDER — SUCRALFATE 1 GM/10ML PO SUSP
1.0000 g | Freq: Three times a day (TID) | ORAL | Status: DC
Start: 2024-06-14 — End: 2024-06-15
  Administered 2024-06-14 (×4): 1 g via ORAL
  Filled 2024-06-14 (×4): qty 10

## 2024-06-14 MED ORDER — MELATONIN 5 MG PO TABS: 10.0000 mg | ORAL_TABLET | Freq: Every evening | ORAL | Status: DC | PRN

## 2024-06-14 MED ORDER — ONDANSETRON HCL 4 MG/2ML IJ SOLN: 4.0000 mg | Freq: Four times a day (QID) | INTRAMUSCULAR | Status: DC | PRN

## 2024-06-14 MED ORDER — LINEZOLID 600 MG/300ML IV SOLN
600.0000 mg | Freq: Two times a day (BID) | INTRAVENOUS | Status: DC
  Administered 2024-06-14 – 2024-06-15 (×2): 600 mg via INTRAVENOUS
  Filled 2024-06-14 (×5): qty 300

## 2024-06-14 MED ORDER — PANTOPRAZOLE SODIUM 40 MG PO TBEC: 40.0000 mg | DELAYED_RELEASE_TABLET | Freq: Two times a day (BID) | ORAL | Status: DC

## 2024-06-14 MED ORDER — ACETAMINOPHEN 650 MG RE SUPP: 650.0000 mg | Freq: Four times a day (QID) | RECTAL | Status: DC | PRN

## 2024-06-14 MED ORDER — ALBUMIN HUMAN 25 % IV SOLN
25.0000 g | Freq: Four times a day (QID) | INTRAVENOUS | Status: DC
  Administered 2024-06-14: 25 g via INTRAVENOUS
  Filled 2024-06-14: qty 100

## 2024-06-14 MED ORDER — PANTOPRAZOLE SODIUM 40 MG PO TBEC
40.0000 mg | DELAYED_RELEASE_TABLET | Freq: Two times a day (BID) | ORAL | Status: DC
Start: 2024-06-14 — End: 2024-06-15
  Administered 2024-06-14: 40 mg via ORAL
  Filled 2024-06-14: qty 1

## 2024-06-14 MED ORDER — MIDODRINE HCL 5 MG PO TABS
10.0000 mg | ORAL_TABLET | Freq: Three times a day (TID) | ORAL | Status: DC
  Administered 2024-06-14: 10 mg via ORAL
  Filled 2024-06-14: qty 2

## 2024-06-14 MED ORDER — ALBUMIN HUMAN 25 % IV SOLN
12.5000 g | Freq: Once | INTRAVENOUS | Status: AC
  Administered 2024-06-14: 12.5 g via INTRAVENOUS
  Filled 2024-06-14: qty 50

## 2024-06-14 NOTE — Progress Notes (Signed)
 Patient transferred to ICU. Report given to [employer]

## 2024-06-14 NOTE — Consult Note (Signed)
 NAME:  [name], MRN:  [id number], DOB:  April 08, 1941, LOS: 1 ADMISSION DATE:  06/13/2024, CONSULTATION DATE:  06/14/24 REFERRING MD:  Kathrin, CHIEF COMPLAINT:  anorexia   History of Present Illness:  83 year old woman w/ hx of cholangiocarcinoma s/p chemotherapy supposed to be starting on immunotherapy, chronic sacral osteomyelitis, recent admit for PUD, UGIB 9/11-9/16 presenting overnight with poor PO, N/V.  Went for diagnostic para today.  Throughout day has been hypotensive unresponsive to fluid so PCCM consulted.  Family at bedside states patient is now essentially bedbound, not eating, not able to take meds, not able to transfer.  Patient would like all aggressive care as a trial, daughters would like to respect this decision.  PCCM consulted to assist with hypotension workup.  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   Cholelithiasis 10/12/2022   Chronic kidney disease, stage 3a (HCC) 04/14/2024   Complication of anesthesia    VERSED - agitated, muscle spasms, jerking , frantic , (never had this occurence in the pas)    Coronary artery disease    Critical lower limb ischemia (HCC) 08/03/2019   Critical limb ischemia     Diabetes mellitus without complication (HCC)    Dysrhythmia    PVC's   Family history of malignant neoplasm of digestive organ 02/17/2022   Gangrene of foot (HCC) 08/07/2019   GERD (gastroesophageal reflux disease)    Heart murmur    History of hiatal hernia     Hyperlipidemia    Hypertension 11/20/2011   ECHO- EF>55% Borderline concentric left ventricular hypertrophy. There is a small calcified mass in the L:A near the LA appendage. No valvular masses seen with associated mitral annular calcification. LA Volume/ BSA27.4 ml/m2 No AS. Right ventricular systolic pressure is elevated at .   Jaundice    Myocardial infarction [facility])    June 2021   Neuromuscular disorder [facility])    neuropathy in bilateral feet   NSTEMI (non-ST elevated myocardial infarction) (HCC) 03/22/2020   Osteomyelitis of foot, left, acute (HCC) 04/27/2021   Other acute osteomyelitis, left ankle and foot (HCC) 02/17/2022   Peripheral vascular disease (HCC)    Pneumonia    not hosp.    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 rate 18, height 5' 5.5 (1.664 m), weight 63.6 kg, SpO2 98%.        Intake/Output Summary (Last 24 hours) at 06/14/2024 1442 Last data filed  at 06/14/2024 1300 Gross per 24 hour  Intake 1748.33 ml  Output 0 ml  Net 1748.33 ml   Filed Weights   06/13/24 1808  Weight: 63.6 kg    Examination: General: chronically ill woman in nAD HENT: MMM, trachea midline Lungs: clear, nonlabored breathing pattern Cardiovascular: ext warm, heart regular borderline tachy Abdomen: soft, +BS Extremities: marked anasarca Neuro: moves everything but profoundly weak RASS 0  Leukocytosis noted, acute on chronic CT C/A/P reviewed  Resolved problem list   Assessment and Plan   Shock state- lactate actually lower than last admit.   Think we are dealing with likely septic shock (PNA, decub?) with background profound protein calorie malnutrition. FTT, cholangiocarcinoma- s/p chemo Ascites- tapped, await studies, abd exam benign AKI- prerenal vs. Septic ATN  - Albumin , midodrine  - Agree with broadening abx, f/u culture data - Agree with ongoing discussions RE GOC - Recheck lactate, think given relatively nontoxic appearance can probably handle in progressive - Likely needs cortrak if all aggressive care wants to be pursued, she is profoundly malnourished and deconditioned, family agrees  Labs   CBC: Recent Labs  Lab 06/13/24 1845 06/14/24 0609  WBC 25.1* 25.0*  NEUTROABS 23.4* 23.1*  HGB 11.1* 10.2*  HCT 34.1* 31.3*  MCV 87.7 87.9  PLT 287 264    Basic Metabolic Panel: Recent Labs  Lab 06/13/24 1845 06/14/24 0609  NA 134* 134*  K 5.4* 4.7  CL 105 108  CO2 17* 17*  GLUCOSE 85 87  BUN 48* 47*  CREATININE 2.16* 2.02*  CALCIUM  8.3* 8.1*   GFR: Estimated Creatinine Clearance: 19.7 mL/min (A) (by C-G formula based on SCr of 2.02 mg/dL (H)). Recent Labs  Lab 06/13/24 1845 06/13/24 2252 06/14/24 0609  PROCALCITON  --   --  6.21  WBC 25.1*  --  25.0*  LATICACIDVEN  --  2.0*  --     Liver Function Tests: Recent Labs  Lab 06/13/24 1845 06/14/24 0609  AST 111* 98*  ALT 42 39  ALKPHOS 388* 346*  BILITOT 0.8 0.7  PROT 3.8* 3.2*  ALBUMIN  <1.5* <1.5*   Recent Labs  Lab 06/13/24 1845  LIPASE 16   No results for input(s): AMMONIA in the last 168 hours.  ABG    Component Value Date/Time   PHART 7.344 (L) 03/25/2020 0309   PCO2ART 42.1 03/25/2020 0309   PO2ART 121 (H) 03/25/2020 0309   HCO3 22.7 03/25/2020 0309   TCO2 26 11/16/2022 0830   ACIDBASEDEF 3.0 (H) 03/25/2020 0309   O2SAT 48.6 03/26/2020 0454     Coagulation Profile: Recent Labs  Lab 06/13/24 1845  INR 1.3*    Cardiac Enzymes: No results for input(s):  CKTOTAL, CKMB, CKMBINDEX, TROPONINI in the last 168 hours.  HbA1C: Hgb A1c MFr Bld  Date/Time Value Ref Range Status  02/13/2024 06:08 AM 4.9 4.8 - 5.6 % Final    Comment:    (NOTE) Pre diabetes:          5.7%-6.4%  Diabetes:              >6.4%  Glycemic control for   <7.0% adults with diabetes   09/23/2023 08:28 AM 6.6 (H) <5.7 % of total Hgb Final    Comment:    For someone without known diabetes, a hemoglobin A1c value of 6.5% or greater indicates that they may have  diabetes and this should be confirmed with a follow-up  test. . For someone with known diabetes, a value <7% indicates  that  their diabetes is well controlled and a value  greater than or equal to 7% indicates suboptimal  control. A1c targets should be individualized based on  duration of diabetes, age, comorbid conditions, and  other considerations. . Currently, no consensus exists regarding use of hemoglobin A1c for diagnosis of diabetes for children. .     CBG: Recent Labs  Lab 06/09/24 1116 06/14/24 0150 06/14/24 0442 06/14/24 0809 06/14/24 1104  GLUCAP 188* 78 81 72 73    Review of Systems:    Positive Symptoms in bold:  Constitutional fevers, chills, weight loss, fatigue, anorexia, malaise  Eyes decreased vision, double vision, eye irritation  Ears, Nose, Mouth, Throat sore throat, trouble swallowing, sinus congestion  Cardiovascular chest pain, paroxysmal nocturnal dyspnea, lower ext edema, palpitations   Respiratory SOB, cough, DOE, hemoptysis, wheezing  Gastrointestinal nausea, vomiting, diarrhea  Genitourinary burning with urination, trouble urinating  Musculoskeletal joint aches, joint swelling, back pain  Integumentary  rashes, skin lesions  Neurological focal weakness, focal numbness, trouble speaking, headaches  Psychiatric depression, anxiety, confusion  Endocrine polyuria, polydipsia, cold intolerance, heat intolerance  Hematologic abnormal bruising, abnormal  bleeding, unexplained nose bleeds  Allergic/Immunologic recurrent infections, hives, swollen lymph nodes     Past Medical History:  She,  has a past medical history of Acute on chronic anemia (04/14/2024), Anemia, Arthritis, Atrial fibrillation with RVR (HCC) (02/12/2024), Cancer (HCC), Carotid artery disease (HCC) (03/31/2013), Cellulitis in diabetic foot (HCC) (04/27/2021), Cellulitis of left lower extremity, Cholangiocarcinoma of liver (HCC) (03/2023), Cholelithiasis (10/12/2022), Chronic kidney disease, stage 3a (HCC) (04/14/2024), Complication of anesthesia, Coronary artery disease, Critical lower limb ischemia (HCC) (08/03/2019), Diabetes mellitus without complication (HCC), Dysrhythmia, Family history of malignant neoplasm of digestive organ (02/17/2022), Gangrene of foot (HCC) (08/07/2019), GERD (gastroesophageal reflux disease), Heart murmur, History of hiatal hernia, Hyperlipidemia, Hypertension (11/20/2011), Jaundice, Myocardial infarction [facility]), Neuromuscular disorder (HCC), NSTEMI (non-ST elevated myocardial infarction) (HCC) (03/22/2020), Osteomyelitis of foot, left, acute (HCC) (04/27/2021), Other acute osteomyelitis, left ankle and foot (HCC) (02/17/2022), Peripheral vascular disease (HCC), Pneumonia, S/P angioplasty with stent Lt SFA of prox segment.  PTCAs with drug coated balloon Lt ant tibial artery and Lt popliteal artery  (11/26/2019), S/P transmetatarsal amputation of foot (HCC) (08/07/2019), and Symptomatic cholelithiasis (10/12/2022).   Surgical History:   Past Surgical History:  Procedure Laterality Date   ABDOMINAL AORTAGRAM  11/25/2019   ABDOMINAL AORTOGRAM W/LOWER EXTREMITY    ABDOMINAL AORTOGRAM N/A 06/15/2019   Procedure: ABDOMINAL AORTOGRAM;  Surgeon: Court Dorn PARAS, MD;  [facility]: [facility];  Service: Cardiovascular;  Laterality: N/A;   ABDOMINAL AORTOGRAM W/LOWER EXTREMITY N/A 11/25/2019   Procedure: ABDOMINAL AORTOGRAM W/LOWER EXTREMITY;  Surgeon:  Darron Deatrice LABOR, MD;  [facility]: [facility];  Service: Cardiovascular;  Laterality: N/A;  Lt leg   ABDOMINAL AORTOGRAM W/LOWER EXTREMITY N/A 08/01/2020   Procedure: ABDOMINAL AORTOGRAM W/LOWER EXTREMITY;  Surgeon: Court Dorn PARAS, MD;  [facility]: [facility];  Service: Cardiovascular;  Laterality: N/A;   ABDOMINAL AORTOGRAM W/LOWER EXTREMITY N/A 04/28/2021   Procedure: ABDOMINAL AORTOGRAM W/LOWER EXTREMITY;  Surgeon: Harvey Carlin BRAVO, MD;  [facility]: [facility];  Service: Cardiovascular;  Laterality: N/A;   ABDOMINAL AORTOGRAM W/LOWER EXTREMITY N/A 10/13/2021   Procedure: ABDOMINAL AORTOGRAM W/LOWER EXTREMITY;  Surgeon: Magda Debby SAILOR, MD;  [facility];  Service: Cardiovascular;  Laterality: N/A;   ABDOMINAL AORTOGRAM W/LOWER EXTREMITY N/A 11/09/2022   Procedure: ABDOMINAL AORTOGRAM W/LOWER EXTREMITY;  Surgeon: Magda Debby SAILOR, MD;  Location: [facility] INVASIVE CV  LAB;  Service: Cardiovascular;  Laterality: N/A;   ABDOMINAL AORTOGRAM W/LOWER EXTREMITY N/A 11/16/2022   Procedure: ABDOMINAL AORTOGRAM W/LOWER EXTREMITY;  Surgeon: Magda Debby SAILOR, MD;  [facility];  Service: Cardiovascular;  Laterality: N/A;   AMPUTATION TOE Left 05/03/2021   Procedure: AMPUTATION OF THIRD LEFT  TOE;  Surgeon: Magda Debby SAILOR, MD;  Location: [facility] OR;  Service: Vascular;  Laterality: Left;   APPENDECTOMY     APPLICATION OF WOUND VAC Right 07/21/2019   Procedure: Application Of Prevena Wound Vac Right Groin;  Surgeon: Serene Gaile ORN, MD;  Location: [facility] OR;  Service: Vascular;  Laterality: Right;   BILIARY BRUSHING  04/07/2023   Procedure: BILIARY BRUSHING;  Surgeon: Rosalie Kitchens, MD;  Location: [facility] ENDOSCOPY;  Service: Gastroenterology;;   BILIARY STENT PLACEMENT  04/07/2023   Procedure: BILIARY STENT PLACEMENT;  Surgeon: Rosalie Kitchens, MD;  Location: [facility] ENDOSCOPY;  Service: Gastroenterology;;   CARPAL TUNNEL RELEASE Left    CARPAL TUNNEL RELEASE Right    CESAREAN  SECTION     x 2   CHOLECYSTECTOMY N/A 10/12/2022   Procedure: LAPAROSCOPIC PARTIAL FENESTRATING CHOLECYSTECTOMY;  Surgeon: Dasie Leonor CROME, MD;  [facility];  Service: General;  Laterality: N/A;   COLONOSCOPY     CORONARY ARTERY BYPASS GRAFT N/A 03/24/2020   Procedure: CORONARY ARTERY BYPASS GRAFTING (CABG) using LIMA to LAD (m); RIMA to RAMUS; Endoscopic Right Greater Saphenous Vein: SVG to Diag1; SVG to PLB (right); and SVG to PL (left).;  Surgeon: German Bartlett PEDLAR, MD;  [facility] OR;  Service: Open Heart Surgery;  Laterality: N/A;  BILATERAL IMA   ENDARTERECTOMY FEMORAL Right 07/21/2019   Procedure: RIGHT ENDARTERECTOMY FEMORAL WITH PATCH ANGIOPLASTY;  Surgeon: Serene Gaile ORN, MD;  [facility];  Service: Vascular;  Laterality: Right;   ENDARTERECTOMY FEMORAL Right 08/16/2020   Procedure: RIGHT FEMORAL ENDARTERECTOMY;  Surgeon: Sheree Penne Bruckner, MD;  Location: [facility] OR;  Service: Vascular;  Laterality: Right;   ENDOVEIN HARVEST OF GREATER SAPHENOUS VEIN Right 03/24/2020   Procedure: ENDOVEIN HARVEST OF GREATER SAPHENOUS VEIN;  Surgeon: German Bartlett PEDLAR, MD;  [facility];  Service: Open Heart Surgery;  Laterality: Right;   ERCP N/A 04/07/2023   Procedure: ENDOSCOPIC RETROGRADE CHOLANGIOPANCREATOGRAPHY (ERCP);  Surgeon: Rosalie Kitchens, MD;  Location: [facility] ENDOSCOPY;  Service: Gastroenterology;  Laterality: N/A;   ESOPHAGOGASTRODUODENOSCOPY N/A 06/04/2024   Procedure: EGD (ESOPHAGOGASTRODUODENOSCOPY);  Surgeon: Saintclair Jasper, MD;  Location: [facility];  Service: Gastroenterology;  Laterality: N/A;   EYE SURGERY     cataract removal bilaterally   FEMORAL-POPLITEAL BYPASS GRAFT Right 08/16/2020   Procedure: BYPASS GRAFT FEMORAL-POPLITEAL ARTERY;  Surgeon: Sheree Penne Bruckner, MD;  Location: [facility] OR;  Service: Vascular;  Laterality: Right;   FEMORAL-POPLITEAL BYPASS GRAFT Left 05/03/2021   Procedure: LEFT FEMORAL TO BELOW KNEE POPLITEAL ARTERY BYPASS GRAFTING WITH 6MMX80 PTFE  GRAFT;  Surgeon: Magda Debby SAILOR, MD;  [facility];  Service: Vascular;  Laterality: Left;   LEFT HEART CATH AND CORONARY ANGIOGRAPHY N/A 03/22/2020   Procedure: LEFT HEART CATH AND CORONARY ANGIOGRAPHY;  Surgeon: Swazil[place], Peter M, MD;  Location: [facility] INVASIVE CV LAB;  Service: Cardiovascular;  Laterality: N/A;   LOWER EXTREMITY ANGIOGRAPHY Bilateral 06/15/2019   Procedure: Lower Extremity Angiography;  Surgeon: Court Dorn PARAS, MD;  Location: [facility] INVASIVE CV LAB;  Service: Cardiovascular;  Laterality: Bilateral;   LOWER EXTREMITY ANGIOGRAPHY Right 08/03/2019   Procedure: LOWER EXTREMITY ANGIOGRAPHY;  Surgeon: Court Dorn PARAS, MD;  [facility];  Service: Cardiovascular;  Laterality: Right;   LOWER EXTREMITY ANGIOGRAPHY N/A 09/07/2021   Procedure: LOWER EXTREMITY ANGIOGRAPHY;  Surgeon: Gretta Lonni PARAS, MD;  [facility];  Service: Cardiovascular;  Laterality: N/A;   PATCH ANGIOPLASTY Right 07/21/2019   Procedure: Patch Angioplasty Right Femoral Artery;  Surgeon: Serene Gaile ORN, MD;  Location: [facility] OR;  Service: Vascular;  Laterality: Right;   PERIPHERAL INTRAVASCULAR LITHOTRIPSY  11/25/2019   Procedure: INTRAVASCULAR LITHOTRIPSY;  Surgeon: Darron Deatrice LABOR, MD;  [facility];  Service: Cardiovascular;;  LT. SFA   PERIPHERAL VASCULAR ATHERECTOMY  08/03/2019   Procedure: PERIPHERAL VASCULAR ATHERECTOMY;  Surgeon: Court Dorn PARAS, MD;  Location: [facility] INVASIVE CV LAB;  Service: Cardiovascular;;  right SFA, right TP trunk   PERIPHERAL VASCULAR ATHERECTOMY  11/25/2019   Procedure: PERIPHERAL VASCULAR ATHERECTOMY;  Surgeon: Darron Deatrice LABOR, MD;  [facility];  Service: Cardiovascular;;  Lt.  POPLITEAL and AT   PERIPHERAL VASCULAR BALLOON ANGIOPLASTY Left 06/15/2019   Procedure: PERIPHERAL VASCULAR BALLOON ANGIOPLASTY;  Surgeon: Court Dorn PARAS, MD;  [facility];  Service: Cardiovascular;  Laterality: Left;  SFA  UNSUCCESSFUL UNABLE TO CROSS LESION   PERIPHERAL VASCULAR BALLOON ANGIOPLASTY  08/03/2019   Procedure: PERIPHERAL VASCULAR BALLOON ANGIOPLASTY;  Surgeon: Court Dorn PARAS, MD;  [facility];  Service: Cardiovascular;;  right SFA, Right TP trunk   PERIPHERAL VASCULAR BALLOON ANGIOPLASTY  09/07/2021   Procedure: PERIPHERAL VASCULAR BALLOON ANGIOPLASTY;  Surgeon: Gretta Lonni PARAS, MD;  [facility];  Service: Cardiovascular;;   PERIPHERAL VASCULAR BALLOON ANGIOPLASTY Right 10/13/2021   Procedure: PERIPHERAL VASCULAR BALLOON ANGIOPLASTY;  Surgeon: Magda Debby SAILOR, MD;  [facility];  Service: Cardiovascular;  Laterality: Right;   PERIPHERAL VASCULAR BALLOON ANGIOPLASTY  11/09/2022   Procedure: PERIPHERAL VASCULAR BALLOON ANGIOPLASTY;  Surgeon: Magda Debby SAILOR, MD;  [facility];  Service: Cardiovascular;;  fem-pop bypass and AT   PORTACATH PLACEMENT Left 04/29/2023   Procedure: INSERTION PORT-A-CATH;  Surgeon: Dasie Leonor CROME, MD;  Location: [facility] ORS;  Service: General;  Laterality: Left;   PORTACATH PLACEMENT N/A 07/12/2023   Procedure: REVISION PORT-A-CATH;  Surgeon: Dasie Leonor CROME, MD;  Location: [facility] OR;  Service: General;  Laterality: N/A;   REMOVAL OF STONES  04/07/2023   Procedure: REMOVAL OF STONES;  Surgeon: Rosalie Kitchens, MD;  Location: [facility] ENDOSCOPY;  Service: Gastroenterology;;   ANNETT  04/07/2023   Procedure: ANNETT;  Surgeon: Rosalie Kitchens, MD;  Location: [facility] ENDOSCOPY;  Service: Gastroenterology;;   TEE WITHOUT CARDIOVERSION N/A 03/24/2020   Procedure: TRANSESOPHAGEAL ECHOCARDIOGRAM (TEE);  Surgeon: German Bartlett PEDLAR, MD;  Location: [facility] OR;  Service: Open Heart Surgery;  Laterality: N/A;   TONSILLECTOMY     and adenoidectomy   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/2019   Procedure: TRANSMETATARSAL AMPUTATION;  Surgeon: Gershon Donnice SAUNDERS, DPM;  [facility] OR;  Service: Podiatry;  Laterality: Right;    TRANSMETATARSAL AMPUTATION Left 05/11/2021   Procedure: LEFT TRANSMETATARSAL AMPUTATION;  Surgeon: Sheree Penne Lonni, MD;  Location: [facility] OR;  Service: Vascular;  Laterality: Left;   TUBAL LIGATION       Social History:   reports that she quit smoking about 52 years ago. Her smoking use included cigarettes. She has never used smokeless tobacco. She reports that she does not drink alcohol  and does not use drugs.   Family History:  Her family history includes Cancer - Colon in her father; Cancer - Prostate  in her father; Hyperlipidemia in her mother; Hypertension in her mother; Melanoma in her brother; Stroke in her mother.   Allergies Allergies  Allergen Reactions   Versed  [Midazolam ] Anxiety and Other (See Comments)    Panic Agitation    Bactrim  [Sulfamethoxazole -Trimethoprim ] Other (See Comments)    ^ K+( elevated)    Augmentin [Amoxicillin-Pot Clavulanate] Other (See Comments)    Elevates potassium level   Demerol [Meperidine] Other (See Comments)    Delusions   Transderm-Scop [Scopolamine] Other (See Comments)    Delusions   Ultram  [Tramadol ] Other (See Comments)    Insomnia      Home Medications  Prior to Admission medications   Medication Sig Start Date End Date Taking? Authorizing Provider  acetaminophen  (TYLENOL ) 500 MG tablet Take 1,000 mg by mouth every 6 (six) hours as needed for moderate pain (pain score 4-6), fever or mild pain (pain score 1-3).    [provider]  brimonidine  (ALPHAGAN ) 0.2 % ophthalmic solution Place 1 drop into the right eye 2 (two) times daily. 01/19/21   [provider]  ferrous sulfate  325 (65 FE) MG EC tablet Take 1 tablet (325 mg total) by mouth daily before breakfast. To be taken with a cup of orange juice on an otherwise empty stomach if possible Patient taking differently: Take 325 mg by mouth daily. 04/16/24 04/16/25  Shalhoub, Zachary PARAS, MD  furosemide  (LASIX ) 20 MG tablet Take 1 tablet (20 mg total) by mouth  daily. Patient not taking: Reported on 06/03/2024 06/01/24   Lanny Callander, MD  Insulin  Lispro Prot & Lispro (HUMALOG  MIX 75/25 KWIKPEN) (75-25) 100 UNIT/ML Kwikpen Inject 0-6 Units into the skin daily as needed (hyperglycemia).    [provider]  JARDIANCE  25 MG TABS tablet Take 25 mg by mouth daily. 04/20/24   [provider]  magnesium  oxide (MAG-OX) 400 (240 Mg) MG tablet Take 400 mg by mouth 2 (two) times daily.    [provider]  metFORMIN  (GLUCOPHAGE ) 1000 MG tablet Take 1,000 mg by mouth 2 (two) times daily.    [provider]  metoprolol  tartrate (LOPRESSOR ) 25 MG tablet Take 1 tablet (25 mg total) by mouth 2 (two) times daily. 02/18/24   Krishnan, Gokul, MD  mirtazapine  (REMERON ) 15 MG tablet Take 15 mg by mouth at bedtime.    [provider]  nystatin (MYCOSTATIN/NYSTOP) powder Apply 1 Application topically 2 (two) times daily as needed (skin irritation).    [provider]  pantoprazole  (PROTONIX ) 40 MG tablet Take 1 tablet (40 mg total) by mouth 2 (two) times daily. 06/09/24 06/09/25  Jadine Toribio SQUIBB, MD  polyethylene glycol (MIRALAX  / GLYCOLAX ) 17 g packet Take 17 g by mouth daily. Patient taking differently: Take 17 g by mouth daily as needed (constipation). 02/19/24   Krishnan, Gokul, MD  Propylene Glycol (SYSTANE BALANCE) 0.6 % SOLN Place 1 drop into the left eye 3 (three) times daily as needed (dry/irritated eyes).    [provider]  rosuvastatin  (CRESTOR ) 40 MG tablet Take 40 mg by mouth at bedtime. 05/01/24   [provider]  sucralfate  (CARAFATE ) 1 g tablet Take 1 tablet (1 g total) by mouth 4 (four) times daily. 06/09/24 06/09/25  Jadine Toribio SQUIBB, MD  telmisartan  (MICARDIS ) 40 MG tablet Take 40 mg by mouth daily. 03/27/24   [provider]  traZODone  (DESYREL ) 100 MG tablet Take 100 mg by mouth at bedtime. 03/12/24   [provider]     Critical care time: n/a

## 2024-06-14 NOTE — Progress Notes (Signed)
   06/14/24 2000  Assess: MEWS Score  Temp (!) 94.7 F (34.8 C)  BP (!) 95/47  MAP (mmHg) (!) 61  Pulse Rate 97  ECG Heart Rate 100  Resp 11  SpO2 99 %  O2 Device Nasal Cannula  Patient Activity (if Appropriate) In bed  O2 Flow Rate (L/min) 4 L/min  Assess: MEWS Score  MEWS Temp 2  MEWS Systolic 1  MEWS Pulse 0  MEWS RR 1  MEWS LOC 0  MEWS Score 4  MEWS Score Color Red  Assess: if the MEWS score is Yellow or Red  Were vital signs accurate and taken at a resting state? Yes  Does the patient meet 2 or more of the SIRS criteria? Yes  Does the patient have a confirmed or suspected source of infection? Yes  MEWS guidelines implemented  Yes, red  Treat  MEWS Interventions Considered administering scheduled or prn medications/treatments as ordered  Take Vital Signs  Increase Vital Sign Frequency  Red: Q1hr x2, continue Q4hrs until patient remains green for 12hrs  Escalate  MEWS: Escalate Red: Discuss with charge nurse and notify provider. Consider notifying RRT. If remains red for 2 hours consider need for higher level of care  Notify: Charge Nurse/RN  Name of Charge Nurse/RN Notified Tanya RN  Provider Notification  Provider Name/Title Dr. Charlton  Date Provider Notified 06/14/24  Time Provider Notified 2010  Method of Notification Page  Notification Reason Change in status;Other (Comment) (Rectal Temp 94.7)  Provider response See new orders  Date of Provider Response 06/14/24  Time of Provider Response 2013  Notify: Rapid Response  Name of Rapid Response RN Notified Mindy RN  Date Rapid Response Notified 06/14/24  Time Rapid Response Notified 2045  Assess: SIRS CRITERIA  SIRS Temperature  1  SIRS Respirations  0  SIRS Pulse 1  SIRS WBC 0  SIRS Score Sum  2    Pt A&O x4 Bair Hugger ordered.

## 2024-06-14 NOTE — Progress Notes (Signed)
 Patient's is c/o slight nausea.  Does not feel able to take anything PO.  Dr. Charlton made aware.  CBG was 78 at that time.  Insulin  Coverage and CBGS were changed to Q 4 hours.  Protonix  changed to IV x 1 dose.  Later in the shift, the patient's b/p continued to drop. She is receiving LR @ 100. Manual pressure is 88/32. Pressure on Dinamap is 85/32 (MAP 50). Unable to get O2 levels on hands - they are purple tinged. Dr. Charlton made aware.  Order received for continuous pulse ox since this is the only way I can get an accurate pulse ox reading is on her ear. With this, her O2 Sat on RA is 100%. 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 She is incontinent of urine and stool. Order received for Purewick.  Also, order received to give Albumin  x 1.  Suzen Ice RN

## 2024-06-14 NOTE — Plan of Care (Signed)

## 2024-06-14 NOTE — Progress Notes (Signed)
 PROGRESS NOTE  [name] FMW:[id number] DOB: 1940-12-07   PCP: Chrystal Lamarr RAMAN, MD  Patient is from: Home.  Lives with daughter.  DOA: 06/13/2024 LOS: 1  Chief complaints Chief Complaint  Patient presents with   Abdominal Pain   Hypotension     Brief Narrative / Interim history: 83 year old F with PMH of cholangiocarcinoma, stage IV sacral pressure ulcer, chronic sacral osteomyelitis, CKD-3, A-fib not on AC due to GIB, DM-2 and HTN returning with abdominal pain.  Patient was hospitalized from 9/9-9/16 for symptomatic anemia in the setting of hematemesis.  Underwent EGD that showed clotted blood in gastric body, nonbleeding gastric ulcers and erythematous mucosa.  Xarelto  was discontinued.  Therapy recommended SNF but she had no nursing home days left on her insurance and discharged home with daughter.   In ED, hypotensive to 86/47.  WBC 25.1 with left shift.  K5.4. Cr 2.16 (was 1.08 on 9/12).  BUN 48.  Albumin  <1.5.  AST 111.  ALT 45.  ALP 388.  Total bili 0.8.  RVP negative.  Lipase 16.  CXR concerning for RLL consolidation.  CT chest, abdomen and pelvis showed RLL and RML consolidation, small bilateral effusion, large volume ascites, anasarca and questionable thickening of ascending and transverse colon as well as right sacral decubitus ulcer with cortical irregularity.  Cultures obtained.  Patient was started on IV fluid.  Received cefepime  and vancomycin  and admitted on Unasyn .  Paracentesis ordered.   The next day, patient remained hypotensive spite IV fluid and IV albumin .  Paracentesis attempted but only able to remove 100 cc due to hypotension.  PCCM consulted Subjective: Seen and examined earlier this morning and this afternoon.  Patient complains pain in her backside.  Denies abdominal pain, chest pain or shortness of breath.  Daughters at bedside this afternoon.  Extensive discussion about CODE STATUS including pros and cons of CPR and intubation.  Patient still wants us   to try CPR.  Daughters understand that CPR and intubation are not to her best interest but she is lucid enough to make her own decision at the moment.  Objective: Vitals:   06/14/24 0923 06/14/24 1107 06/14/24 1110 06/14/24 1421  BP: (!) 111/41 (!) 64/29 (!) 79/66 (!) 93/33  Pulse: (!) 105 100 98 (!) 102  Resp: 18 18    Temp: (!) 97.5 F (36.4 C) (!) 97.5 F (36.4 C)    TempSrc: Oral Oral    SpO2: 98% 99%  98%  Weight:      Height:        Examination:  GENERAL: No apparent distress.  Nontoxic. HEENT: MMM.  Vision and hearing grossly intact.  NECK: Supple.  No apparent JVD.  RESP:  No IWOB.  Fair aeration bilaterally. CVS:  RRR. Heart sounds normal.  ABD/GI/GU: BS+. Abd soft and full.  Large ascites. MSK/EXT:  Moves extremities.  Edematous all over. SKIN: Stable sacral decubitus and bilateral leg ulcers.  See pictures under media NEURO: AA.  Oriented appropriately.  No apparent focal neuro deficit. PSYCH: Calm. Normal affect.   Consultants:  Critical care Interventional radiology  Procedures: Paracentesis with removal of 100 cc fluid  Microbiology summarized: COVID-19, influenza and RSV PCR nonreactive Blood cultures NGTD Peritoneal fluid culture pending  Assessment and plan: Severe sepsis due to aspiration pneumonia: Present on admission.  Meets criteria with tachycardia, leukocytosis, lactic acidosis, AKI and hypotension.  Source thought to be aspiration ammonia.  Reportedly had an episode of emesis before arrival.  Imaging concerning for RML  and RLL consolidation.  Blood cultures NGTD.  Pro-Cal elevated. - Escalate antibiotics to Zyvox , cefepime  and Flagyl  - Recheck lactic acid - IV albumin  25 g every 2 hours x 3 doses -Midodrine  10 mg 3 times daily - Appreciate help by critical care  Anasarca/large ascites: Exam and imaging with large ascites.  Likely due to cholangiocarcinoma.  She is not tender.  -S/p paracentesis with removal of only 100 cc fluid due to  hypotension.  - Follow fluid studies  Gallbladder cancer: Diagnosed in 03/2023.  Followed by oncology, Dr. Dempsey.  She was switched to maintenance therapy with durvalumab  on 11/22/2023.  Last dose seems to be on 06/01/2024. - Add oncology to treatment team - Continue holding immunotherapy  IDDM-2: A1c 4.9%. Recent Labs  Lab 06/09/24 1116 06/14/24 0150 06/14/24 0442 06/14/24 0809 06/14/24 1104  GLUCAP 188* 78 81 72 73  - Monitor CBG due to poor p.o. intake.  AKI on CKD-3A: her Cr was 1.08 on 9/12.  No obstruction on CT.  Not making urine. Recent Labs    05/18/24 0841 05/22/24 2047 06/01/24 1333 06/02/24 1029 06/03/24 0111 06/03/24 0806 06/04/24 0342 06/05/24 0455 06/13/24 1845 06/14/24 0609  BUN 35* 35* 41* 41* 39* 40* 38* 34* 48* 47*  CREATININE 1.94* 1.99* 1.65* 1.76* 1.35* 1.24* 1.23* 1.08* 2.16* 2.02*  - IV albumin  as above - Hold of diuretics for now - Avoid nephrotoxic drugs  Hypotension: Low BP with maps in 40s and 50s.  Mentating well. - IV albumin , midodrine  as above - PCCM on board  Normocytic anemia: H&H relatively stable.  No overt bleeding Recent Labs    06/02/24 1029 06/02/24 1902 06/03/24 0111 06/03/24 0806 06/03/24 1626 06/04/24 0342 06/04/24 1034 06/05/24 0455 06/13/24 1845 06/14/24 0609  HGB 5.7* 10.0* 9.1* 9.0* 11.9* 10.1* 10.6* 11.0* 11.1* 10.2*  - Continue monitoring  Paroxysmal A-fib: Rate controlled.  Not on anticoagulation due to GI bleed. - Optimize electrolyte - Telemetry monitoring  Hyperkalemia: Resolved.  Hyponatremia: Mild - Continue monitoring  Leukocytosis/bandemia - Antibiotics as above - Recheck in the morning  Non-anion anion gap metabolic acidosis likely due to renal failure on IV fluid. - Check lactic acid  Advance care planning: Remains full code.  See goal of care discussion above - Palliative consulted.  Failure to thrive/hypoalbuminemia Body mass index is 22.98 kg/m.   Unstageable pressure injury of  sacrum, right ankle, left foot: Present on arrival. Wound 04/29/24 0958 Pressure Injury Coccyx Right Stage 4 - Full thickness tissue loss with exposed bone, tendon or muscle. (Active)     Wound 04/29/24 1004 Pressure Injury Sacrum Medial Stage 4 - Full thickness tissue loss with exposed bone, tendon or muscle. (Active)     Wound 06/14/24 0130 Pressure Injury Ankle Right;Lateral Unstageable - Full thickness tissue loss in which the base of the injury is covered by slough (yellow, tan, gray, green or brown) and/or eschar (tan, brown or black) in the wound bed. (Active)     Wound 06/14/24 0130 Pressure Injury Ankle Left;Lateral Unstageable - Full thickness tissue loss in which the base of the injury is covered by slough (yellow, tan, gray, green or brown) and/or eschar (tan, brown or black) in the wound bed. (Active)     Wound 06/13/24 0130 Pressure Injury Foot Left;Lateral Unstageable - Full thickness tissue loss in which the base of the injury is covered by slough (yellow, tan, gray, green or brown) and/or eschar (tan, brown or black) in the wound bed. (Active)   DVT prophylaxis:  SCDs Start: 06/14/24 0133  Code Status: Full code-discussed with patient and daughters at bedside. Family Communication: Data patient's daughters at bedside Level of care: Progressive Status is: Inpatient Remains inpatient appropriate because: Severe sepsis, hypotension, AKI   Final disposition: To be determined  CRITICAL CARE Performed by: Mignon ONEIDA Bump   Total critical care time: 66 minutes  Critical care time was exclusive of separately billable procedures and treating other patients.  Critical care was necessary to treat or prevent imminent or life-threatening deterioration.  Critical care was time spent personally by me on the following activities: development of treatment plan with patient and/or surrogate as well as nursing, discussions with consultants, evaluation of patient's response to treatment,  examination of patient, obtaining history from patient or surrogate, ordering and performing treatments and interventions, ordering and review of laboratory studies, ordering and review of radiographic studies, pulse oximetry and re-evaluation of patient's condition.     Sch Meds:  Scheduled Meds:  Chlorhexidine  Gluconate Cloth  6 each Topical Daily   [START ON 06/15/2024] Influenza vac split trivalent PF  0.5 mL Intramuscular Tomorrow-1000   insulin  aspart  0-6 Units Subcutaneous Q4H   leptospermum manuka honey  1 Application Topical Daily   midodrine   10 mg Oral TID WC   pantoprazole   40 mg Oral BID   sucralfate   1 g Oral TID WC & HS   traZODone   100 mg Oral QHS   Continuous Infusions:  albumin  human     linezolid  (ZYVOX ) IV     metronidazole      PRN Meds:.acetaminophen  **OR** acetaminophen , melatonin, ondansetron  **OR** ondansetron  (ZOFRAN ) IV  Antimicrobials: Anti-infectives (From admission, onward)    Start     Dose/Rate Route Frequency Ordered Stop   06/14/24 1530  linezolid  (ZYVOX ) IVPB 600 mg        600 mg 300 mL/hr over 60 Minutes Intravenous Every 12 hours 06/14/24 1444     06/14/24 1530  metroNIDAZOLE  (FLAGYL ) IVPB 500 mg        500 mg 100 mL/hr over 60 Minutes Intravenous Every 12 hours 06/14/24 1444     06/13/24 2315  Ampicillin -Sulbactam (UNASYN ) 3 g in sodium chloride  0.9 % 100 mL IVPB  Status:  Discontinued        3 g 200 mL/hr over 30 Minutes Intravenous Every 12 hours 06/13/24 2308 06/14/24 1444   06/13/24 1915  vancomycin  (VANCOCIN ) IVPB 1000 mg/200 mL premix        1,000 mg 200 mL/hr over 60 Minutes Intravenous  Once 06/13/24 1913 06/13/24 2105   06/13/24 1915  ceFEPIme  (MAXIPIME ) 2 g in sodium chloride  0.9 % 100 mL IVPB        2 g 200 mL/hr over 30 Minutes Intravenous  Once 06/13/24 1913 06/13/24 1955        I have personally reviewed the following labs and images: CBC: Recent Labs  Lab 06/13/24 1845 06/14/24 0609  WBC 25.1* 25.0*  NEUTROABS  23.4* 23.1*  HGB 11.1* 10.2*  HCT 34.1* 31.3*  MCV 87.7 87.9  PLT 287 264   BMP &GFR Recent Labs  Lab 06/13/24 1845 06/14/24 0609  NA 134* 134*  K 5.4* 4.7  CL 105 108  CO2 17* 17*  GLUCOSE 85 87  BUN 48* 47*  CREATININE 2.16* 2.02*  CALCIUM  8.3* 8.1*   Estimated Creatinine Clearance: 19.7 mL/min (A) (by C-G formula based on SCr of 2.02 mg/dL (H)). Liver & Pancreas: Recent Labs  Lab 06/13/24 1845 06/14/24 0609  AST 111*  98*  ALT 42 39  ALKPHOS 388* 346*  BILITOT 0.8 0.7  PROT 3.8* 3.2*  ALBUMIN  <1.5* <1.5*   Recent Labs  Lab 06/13/24 1845  LIPASE 16   No results for input(s): AMMONIA in the last 168 hours. Diabetic: No results for input(s): HGBA1C in the last 72 hours. Recent Labs  Lab 06/09/24 1116 06/14/24 0150 06/14/24 0442 06/14/24 0809 06/14/24 1104  GLUCAP 188* 78 81 72 73   Cardiac Enzymes: No results for input(s): CKTOTAL, CKMB, CKMBINDEX, TROPONINI in the last 168 hours. No results for input(s): PROBNP in the last 8760 hours. Coagulation Profile: Recent Labs  Lab 06/13/24 1845  INR 1.3*   Thyroid  Function Tests: No results for input(s): TSH, T4TOTAL, FREET4, T3FREE, THYROIDAB in the last 72 hours. Lipid Profile: No results for input(s): CHOL, HDL, LDLCALC, TRIG, CHOLHDL, LDLDIRECT in the last 72 hours. Anemia Panel: No results for input(s): VITAMINB12, FOLATE, FERRITIN, TIBC, IRON , RETICCTPCT in the last 72 hours. Urine analysis:    Component Value Date/Time   COLORURINE YELLOW 05/12/2024 1113   APPEARANCEUR CLOUDY (A) 05/12/2024 1113   LABSPEC 1.013 05/12/2024 1113   PHURINE 5.0 05/12/2024 1113   GLUCOSEU >=500 (A) 05/12/2024 1113   HGBUR NEGATIVE 05/12/2024 1113   BILIRUBINUR NEGATIVE 05/12/2024 1113   KETONESUR NEGATIVE 05/12/2024 1113   PROTEINUR 30 (A) 05/12/2024 1113   NITRITE NEGATIVE 05/12/2024 1113   LEUKOCYTESUR TRACE (A) 05/12/2024 1113   Sepsis Labs: Invalid input(s):  PROCALCITONIN, LACTICIDVEN  Microbiology: Recent Results (from the past 240 hours)  Resp panel by RT-PCR (RSV, Flu A&B, Covid)     Status: None   Collection Time: 06/13/24  6:18 PM   Specimen: Nasal Swab  Result Value Ref Range Status   SARS Coronavirus 2 by RT PCR NEGATIVE NEGATIVE Final   Influenza A by PCR NEGATIVE NEGATIVE Final   Influenza B by PCR NEGATIVE NEGATIVE Final    Comment: (NOTE) The Xpert Xpress SARS-CoV-2/FLU/RSV plus assay is intended as an aid in the diagnosis of influenza from Nasopharyngeal swab specimens and should not be used as a sole basis for treatment. Nasal washings and aspirates are unacceptable for Xpert Xpress SARS-CoV-2/FLU/RSV testing.  Fact Sheet for Patients: BloggerCourse.com  Fact Sheet for Healthcare Providers: SeriousBroker.it  This test is not yet approved or cleared by the [place]  FDA and has been authorized for detection and/or diagnosis of SARS-CoV-2 by FDA under an Emergency Use Authorization (EUA). This EUA will remain in effect (meaning this test can be used) for the duration of the COVID-19 declaration under Section 564(b)(1) of the Act, 21 U.S.C. section 360bbb-3(b)(1), unless the authorization is terminated or revoked.     Resp Syncytial Virus by PCR NEGATIVE NEGATIVE Final    Comment: (NOTE) Fact Sheet for Patients: BloggerCourse.com  Fact Sheet for Healthcare Providers: SeriousBroker.it  This test is not yet approved or cleared by the [place]  FDA and has been authorized for detection and/or diagnosis of SARS-CoV-2 by FDA under an Emergency Use Authorization (EUA). This EUA will remain in effect (meaning this test can be used) for the duration of the COVID-19 declaration under Section 564(b)(1) of the Act, 21 U.S.C. section 360bbb-3(b)(1), unless the authorization is terminated or revoked.  Performed at  [facility], [address], [place], [place] [address]   Culture, blood (routine x 2)     Status: None (Preliminary result)   Collection Time: 06/13/24  6:45 PM   Specimen: BLOOD  Result Value Ref Range Status  Specimen Description BLOOD SITE NOT SPECIFIED  Final   Special Requests   Final    BOTTLES DRAWN AEROBIC AND ANAEROBIC Blood Culture results may not be optimal due to an inadequate volume of blood received in culture bottles   Culture   Final    NO GROWTH < 24 HOURS Performed at [facility], [address], [place], [place] [address]    Report Status PENDING  Incomplete  Culture, blood (routine x 2)     Status: None (Preliminary result)   Collection Time: 06/13/24  6:45 PM   Specimen: BLOOD  Result Value Ref Range Status   Specimen Description BLOOD SITE NOT SPECIFIED  Final   Special Requests   Final    BOTTLES DRAWN AEROBIC AND ANAEROBIC Blood Culture results may not be optimal due to an inadequate volume of blood received in culture bottles   Culture   Final    NO GROWTH < 24 HOURS Performed at [facility], [address] [place], [place] [address]    Report Status PENDING  Incomplete    Radiology Studies: US  Paracentesis Result Date: 06/14/2024 INDICATION: Patient with a history of cholangiocarcinoma presents today with ascites. Interventional radiology asked to perform a diagnostic and therapeutic paracentesis. EXAM: ULTRASOUND GUIDED PARACENTESIS MEDICATIONS: 1% lidocaine  10 mL COMPLICATIONS: None immediate. PROCEDURE: Informed written consent was obtained from the patient after a discussion of the risks, benefits and alternatives to treatment. A timeout was performed prior to the initiation of the procedure. Initial ultrasound scanning demonstrates a large amount of ascites within the left lower abdominal quadrant. The left lower abdomen was prepped and draped in the usual sterile fashion. 1% lidocaine  was used for local anesthesia. Following this,  a 19 gauge, 7-cm, Yueh catheter was introduced. An ultrasound image was saved for documentation purposes. The paracentesis was performed. The catheter was removed and a dressing was applied. The patient tolerated the procedure well without immediate post procedural complication. FINDINGS: Patient was hypotensive while in ultrasound. Only 100 mL of clear yellow fluid was removed for labs. IMPRESSION: Successful ultrasound-guided paracentesis yielding 100 mL of peritoneal fluid. Primary team made aware of patient's low blood pressure while in ultrasound. Procedure performed by Warren Dais, NP Electronically Signed   By: Cordella Banner   On: 06/14/2024 11:00   CT CHEST ABDOMEN PELVIS WO CONTRAST Result Date: 06/13/2024 CLINICAL DATA:  Sepsis.  Abdominal pain and hypotension. EXAM: CT CHEST, ABDOMEN AND PELVIS WITHOUT CONTRAST TECHNIQUE: Multidetector CT imaging of the chest, abdomen and pelvis was performed following the standard protocol without IV contrast. RADIATION DOSE REDUCTION: This exam was performed according to the departmental dose-optimization program which includes automated exposure control, adjustment of the mA and/or kV according to patient size and/or use of iterative reconstruction technique. COMPARISON:  CT chest abdomen and pelvis 06/02/2024. FINDINGS: CT CHEST FINDINGS Cardiovascular: Heart is mildly enlarged. Aorta is normal in size. There are atherosclerotic calcifications of the aorta and arteries. Patient is status post cardiac surgery. There is no pericardial effusion. Left chest port catheter tip ends in the right atrium. Mediastinum/Nodes: No enlarged mediastinal, hilar, or axillary lymph nodes. Thyroid  gland, trachea, and esophagus demonstrate no significant findings. Lungs/Pleura: There small bilateral pleural effusions, right greater than left. There is airspace consolidation in the right lower lobe inferiorly with bronchograms. There is also small amount airspace consolidation  right middle lobe. Atelectatic changes are seen left lower lobe/lung base. No pneumothorax visualized. Musculoskeletal: There is diffuse chest wall edema, particularly  right. No acute fractures are seen. Median sternotomy wires are present. CT ABDOMEN PELVIS FINDINGS Hepatobiliary: Biliary drainage catheter is unchanged in position. Small amount of pneumobilia persists. There is no focal liver lesion allowing for lack of intravenous contrast. Gallbladder is not visualized. Pancreas: Unremarkable. No pancreatic ductal dilatation or surrounding inflammatory changes. Spleen: Normal in size without focal abnormality. Adrenals/Urinary Tract: There is a 3 mm calculus in the right kidney. Extensive vascular calcifications are present. There is no hydronephrosis in either kidney. Adrenal glands and bladder are within normal limits. Stomach/Bowel: There is no bowel obstruction, pneumatosis or free air. There some questionable wall thickening of the ascending colon and transverse colon. The appendix is not visualized. Small bowel and stomach are within normal limits. Vascular/Lymphatic: There are extensive vascular calcifications present. Aorta and IVC are normal in size. Iliac vascular stents are present. No enlarged lymph nodes are seen. Reproductive: Ovaries and adnexa are within normal limits. Other: There is large volume ascites. There is diffuse body wall edema. There is no focal abdominal wall hernia. Musculoskeletal: Single rounded lucent lesion in the L5 vertebral body appears unchanged. There are degenerative changes of both hips. Right sacral decubitus ulcer is present there is mild underlying cortical irregularity. IMPRESSION: 1. Right lower lobe and right middle lobe airspace consolidation worrisome for pneumonia. 2. Small bilateral pleural effusions, right greater than left. 3. Large volume ascites. 4. Diffuse body wall edema. 5. Questionable wall thickening of the ascending and transverse colon. Correlate  clinically for colitis. 6. Nonobstructing right renal calculus. 7. Right sacral decubitus ulcer with mild underlying cortical irregularity. Correlate clinically for osteomyelitis. 8. Stable single lucent lesion in the L5 vertebral body. 9. Aortic atherosclerosis. Aortic Atherosclerosis (ICD10-I70.0). Electronically Signed   By: Greig Pique M.D.   On: 06/13/2024 21:23   DG Chest Portable 1 View Result Date: 06/13/2024 CLINICAL DATA:  Questionable pneumonia.  Abdominal pain. EXAM: PORTABLE CHEST 1 VIEW COMPARISON:  06/02/2024 FINDINGS: Left Port-A-Cath remains in place, unchanged. Prior CABG. Consolidation in the right lower lobe compatible with pneumonia. No focal opacity on the left. Heart and mediastinal contours are stable. IMPRESSION: Right lower lobe consolidation compatible with pneumonia. Electronically Signed   By: Franky Crease M.D.   On: 06/13/2024 19:08      Addilynne Olheiser T. Felton Buczynski [facility]  If 7PM-7AM, please contact night-coverage [email or web address] 06/14/2024, 2:55 PM

## 2024-06-14 NOTE — Consult Note (Addendum)
 WOC Nurse Consult Note: patient is known to WOC team from previous admissions (last consult 06/02/2024) had a DTPI back in May that has progressed to a Stage 4 Sacral Pressure injury with MRI showing osteomyelitis sacrum 02/2024; followed by infectious disease;  wounds on feet also present on last admit  Reason for Consult:wounds  Wound type: 1.  Full thickness L lateral foot dark dry tissue  2.  Unstageable Pressure Injury B lateral ankles 100% brown necrotic  3.  Stage 4 Pressure Injury sacrum that tunnels to R buttock red moist appears clean  4. Full thickness skin loss r/t skin tears extensive to L arm and R hand pink moist   Pressure Injury POA: Yes Measurement:see nursing flowsheet  Wound bed: as above  Drainage (amount, consistency, odor) see nursing flowsheet  Periwound: mild erythema surrounding sacral wound  Dressing procedure/placement/frequency:  Cleanse sacral and R buttock wound with Vashe wound cleanser, using a Q tip applicator fill wound bed with Vashe moistened Kerlix, cover with dry gauze and silicone foam or ABD pad whichever is preferred.  Cleanse L lateral foot and B lateral ankle wounds with Vashe, apply Medihoney to wound beds daily, cover with dry gauze and secure with Kerlix roll gauze or silicone foam whichever is preferred. B feet should be placed in Prevalon boot to offload pressure Gerlean [phone].  Cleanse L arm and R hand skin tears with NS, apply Xeroform gauze Soila [phone]) to wound beds daily and secure with Kerlix roll gauze or silicone foam whichever is preferred.   Patient should be placed on a low air loss mattress for pressure redistribution and moisture management.  Patient follows with infectious disease for her sacral wound.    POC discussed with primary MD and bedside nurse.  Appreciate A. Casimir, RN assistance with this consult. WOC team will not follow. Re-consult if further needs arise   Thank you,    Shonn Farruggia MSN, RN-BC, [employer]

## 2024-06-14 NOTE — Procedures (Signed)
 PROCEDURE SUMMARY:  ** Diagnostic paracentesis only due to hypotension. Patient with pressures in the low 70s/30s while in ultrasound. Primary team made aware.   Successful US  guided paracentesis from left lower quadrant.  Yielded 100 ml of clear yellow fluid.  No immediate complications.  Pt tolerated well.   Specimen sent for labs.  EBL < 2 mL  Warren JONELLE Dais, NP 06/14/2024 10:59 AM

## 2024-06-14 NOTE — Progress Notes (Signed)
 Pharmacy Antibiotic Note  [name] is a 83 y.o. female admitted on 06/13/2024 with sepsis./aspiration PNA  Pharmacy has been consulted for cefepime  dosing. Patient with [name] on CKD (BL 1.08>>2.16>>2.02. Was on Unasyn  now escalating abx therapy per MD  Plan: Cefepime  2gm Iv q24h Linezolid  600mg  IV q12h per Md Flagyl  500mg  IV q12h per MD  Height: 5' 5.5 (166.4 cm) Weight: 63.6 kg (140 lb 3.4 oz) IBW/kg (Calculated) : 58.15  Temp (24hrs), Avg:97.5 F (36.4 C), Min:97.4 F (36.3 C), Max:97.6 F (36.4 C)  Recent Labs  Lab 06/13/24 1845 06/13/24 2252 06/14/24 0609  WBC 25.1*  --  25.0*  CREATININE 2.16*  --  2.02*  LATICACIDVEN  --  2.0*  --     Estimated Creatinine Clearance: 19.7 mL/min (A) (by C-G formula based on SCr of 2.02 mg/dL (H)).    Allergies  Allergen Reactions   Versed  [Midazolam ] Anxiety and Other (See Comments)    Panic Agitation    Bactrim  [Sulfamethoxazole -Trimethoprim ] Other (See Comments)    ^ K+( elevated)    Augmentin [Amoxicillin-Pot Clavulanate] Other (See Comments)    Elevates potassium level   Demerol [Meperidine] Other (See Comments)    Delusions   Transderm-Scop [Scopolamine] Other (See Comments)    Delusions   Ultram  [Tramadol ] Other (See Comments)    Insomnia     Antimicrobials this admission: 9/20 cefepime  >>  9/20 Vanc x 1 9/21 Unaysn x 2 9/21 Linezolid  >> 9/21 Flagyl  >>  Microbiology results: 9/20 BCx: NGTD   [name], PharmD, BCPS, FNKF Clinical Pharmacist [place] Please utilize Amion for appropriate phone number to reach the unit pharmacist [facility])

## 2024-06-15 ENCOUNTER — Other Ambulatory Visit: Payer: Self-pay

## 2024-06-15 DIAGNOSIS — J690 Pneumonitis due to inhalation of food and vomit: Secondary | ICD-10-CM | POA: Diagnosis not present

## 2024-06-15 DIAGNOSIS — Z66 Do not resuscitate: Secondary | ICD-10-CM

## 2024-06-15 DIAGNOSIS — Z515 Encounter for palliative care: Secondary | ICD-10-CM | POA: Diagnosis not present

## 2024-06-15 DIAGNOSIS — N179 Acute kidney failure, unspecified: Secondary | ICD-10-CM | POA: Diagnosis not present

## 2024-06-15 DIAGNOSIS — C23 Malignant neoplasm of gallbladder: Secondary | ICD-10-CM | POA: Diagnosis not present

## 2024-06-15 DIAGNOSIS — I480 Paroxysmal atrial fibrillation: Secondary | ICD-10-CM | POA: Diagnosis not present

## 2024-06-15 DIAGNOSIS — C221 Intrahepatic bile duct carcinoma: Secondary | ICD-10-CM

## 2024-06-15 DIAGNOSIS — M4628 Osteomyelitis of vertebra, sacral and sacrococcygeal region: Secondary | ICD-10-CM | POA: Diagnosis not present

## 2024-06-15 LAB — COMPREHENSIVE METABOLIC PANEL WITH GFR
ALT: 26 U/L (ref 0–44)
AST: 70 U/L — ABNORMAL HIGH (ref 15–41)
Albumin: 3.6 g/dL (ref 3.5–5.0)
Alkaline Phosphatase: 274 U/L — ABNORMAL HIGH (ref 38–126)
Anion gap: 12 (ref 5–15)
BUN: 45 mg/dL — ABNORMAL HIGH (ref 8–23)
CO2: 16 mmol/L — ABNORMAL LOW (ref 22–32)
Calcium: 9 mg/dL (ref 8.9–10.3)
Chloride: 107 mmol/L (ref 98–111)
Creatinine, Ser: 2.02 mg/dL — ABNORMAL HIGH (ref 0.44–1.00)
GFR, Estimated: 24 mL/min — ABNORMAL LOW (ref >60)
Glucose, Bld: 88 mg/dL (ref 70–99)
Potassium: 4.1 mmol/L (ref 3.5–5.1)
Sodium: 135 mmol/L (ref 135–145)
Total Bilirubin: 1.7 mg/dL — ABNORMAL HIGH (ref 0.0–1.2)
Total Protein: 4.6 g/dL — ABNORMAL LOW (ref 6.5–8.1)

## 2024-06-15 LAB — CBC
HCT: 23.4 % — ABNORMAL LOW (ref 36.0–46.0)
Hemoglobin: 7.7 g/dL — ABNORMAL LOW (ref 12.0–15.0)
MCH: 28.7 pg (ref 26.0–34.0)
MCHC: 32.9 g/dL (ref 30.0–36.0)
MCV: 87.3 fL (ref 80.0–100.0)
Platelets: 197 K/uL (ref 150–400)
RBC: 2.68 MIL/uL — ABNORMAL LOW (ref 3.87–5.11)
RDW: 20.4 % — ABNORMAL HIGH (ref 11.5–15.5)
WBC: 25.9 K/uL — ABNORMAL HIGH (ref 4.0–10.5)
nRBC: 0.1 % (ref 0.0–0.2)

## 2024-06-15 LAB — GLUCOSE, CAPILLARY
Glucose-Capillary: 103 mg/dL — ABNORMAL HIGH (ref 70–99)
Glucose-Capillary: 115 mg/dL — ABNORMAL HIGH (ref 70–99)
Glucose-Capillary: 87 mg/dL (ref 70–99)

## 2024-06-15 LAB — CORTISOL: Cortisol, Plasma: 19.3 ug/dL

## 2024-06-15 LAB — PROCALCITONIN: Procalcitonin: 5.28 ng/mL

## 2024-06-15 LAB — PHOSPHORUS: Phosphorus: 3 mg/dL (ref 2.5–4.6)

## 2024-06-15 LAB — LACTIC ACID, PLASMA: Lactic Acid, Venous: 1.2 mmol/L (ref 0.5–1.9)

## 2024-06-15 LAB — CK: Total CK: 33 U/L — ABNORMAL LOW (ref 38–234)

## 2024-06-15 LAB — MAGNESIUM: Magnesium: 2.1 mg/dL (ref 1.7–2.4)

## 2024-06-15 MED ORDER — GLYCOPYRROLATE 0.2 MG/ML IJ SOLN
0.2000 mg | INTRAMUSCULAR | Status: DC | PRN
  Administered 2024-06-16: 0.2 mg via INTRAVENOUS
  Filled 2024-06-15 (×3): qty 1

## 2024-06-15 MED ORDER — HALOPERIDOL LACTATE 5 MG/ML IJ SOLN: 0.5000 mg | INTRAMUSCULAR | Status: DC | PRN

## 2024-06-15 MED ORDER — FENTANYL CITRATE PF 50 MCG/ML IJ SOSY
25.0000 ug | PREFILLED_SYRINGE | INTRAMUSCULAR | Status: DC | PRN
  Administered 2024-06-15 (×3): 50 ug via INTRAVENOUS
  Filled 2024-06-15 (×3): qty 1

## 2024-06-15 MED ORDER — HYDROMORPHONE BOLUS VIA INFUSION
1.0000 mg | INTRAVENOUS | Status: DC | PRN
Start: 2024-06-15 — End: 2024-06-18
  Administered 2024-06-17: 1 mg via INTRAVENOUS

## 2024-06-15 MED ORDER — LORAZEPAM 2 MG/ML IJ SOLN
1.0000 mg | INTRAMUSCULAR | Status: DC | PRN
  Administered 2024-06-15: 1 mg via INTRAVENOUS
  Filled 2024-06-15: qty 1

## 2024-06-15 MED ORDER — GLYCOPYRROLATE 0.2 MG/ML IJ SOLN
0.2000 mg | INTRAMUSCULAR | Status: DC | PRN
  Filled 2024-06-15: qty 1

## 2024-06-15 MED ORDER — ONDANSETRON 4 MG PO TBDP: 4.0000 mg | ORAL_TABLET | Freq: Four times a day (QID) | ORAL | Status: DC | PRN

## 2024-06-15 MED ORDER — SODIUM CHLORIDE 0.9 % IV SOLN
1.0000 mg/h | INTRAVENOUS | Status: DC
  Administered 2024-06-15 – 2024-06-17 (×2): 1 mg/h via INTRAVENOUS
  Filled 2024-06-15 (×2): qty 5

## 2024-06-15 MED ORDER — HYDROMORPHONE HCL-NACL 50-0.9 MG/50ML-% IV SOLN: 1.0000 mg/h | INTRAVENOUS | Status: DC

## 2024-06-15 MED ORDER — HYDROMORPHONE HCL 1 MG/ML IJ SOLN
1.0000 mg | INTRAMUSCULAR | Status: AC | PRN
  Administered 2024-06-15: 2 mg via INTRAVENOUS
  Filled 2024-06-15: qty 2
  Filled 2024-06-15: qty 4

## 2024-06-15 MED ORDER — ONDANSETRON HCL 4 MG/2ML IJ SOLN: 4.0000 mg | Freq: Four times a day (QID) | INTRAMUSCULAR | Status: DC | PRN

## 2024-06-15 MED ORDER — LORAZEPAM 1 MG PO TABS: 1.0000 mg | ORAL_TABLET | ORAL | Status: DC | PRN

## 2024-06-15 MED ORDER — GLYCOPYRROLATE 1 MG PO TABS
1.0000 mg | ORAL_TABLET | ORAL | Status: DC | PRN
  Filled 2024-06-15: qty 1

## 2024-06-15 MED ORDER — HALOPERIDOL LACTATE 2 MG/ML PO CONC
0.5000 mg | ORAL | Status: DC | PRN
  Filled 2024-06-15: qty 5

## 2024-06-15 MED ORDER — LORAZEPAM 2 MG/ML PO CONC: 1.0000 mg | ORAL | Status: DC | PRN

## 2024-06-15 MED ORDER — HALOPERIDOL 0.5 MG PO TABS
0.5000 mg | ORAL_TABLET | ORAL | Status: DC | PRN
  Filled 2024-06-15: qty 1

## 2024-06-15 MED ORDER — POLYVINYL ALCOHOL 1.4 % OP SOLN: 1.0000 [drp] | Freq: Four times a day (QID) | OPHTHALMIC | Status: DC | PRN

## 2024-06-15 NOTE — Progress Notes (Signed)
 06/15/2024 Lactate normalized. Pct is up. Not HD candidate so I think a PICC is reasonable given high burden of IV meds. Would also place cotrak. RE: BP, would tolerate either MAP 60 or SBP 85 as either has been shown in this particular patient to be perfusing PMT consult appreciated PCCM will be available as needed.  Rolan Sharps MD PCCM

## 2024-06-15 NOTE — Progress Notes (Signed)
 PROGRESS NOTE  [name] FMW:[id number] DOB: [birth year]   PCP: Chrystal Lamarr RAMAN, MD  Patient is from: Home.  Lives with daughter.  DOA: 06/13/2024 LOS: 2  Chief complaints Chief Complaint  Patient presents with   Abdominal Pain   Hypotension     Brief Narrative / Interim history: 83 year old F with PMH of cholangiocarcinoma, stage IV sacral pressure ulcer, chronic sacral osteomyelitis, CKD-3, A-fib not on AC due to GIB, DM-2 and HTN returning with abdominal pain.  Patient was hospitalized from 9/9-9/16 for symptomatic anemia in the setting of hematemesis.  Underwent EGD that showed clotted blood in gastric body, nonbleeding gastric ulcers and erythematous mucosa.  Xarelto  was discontinued.  Therapy recommended SNF but she had no nursing home days left on her insurance and discharged home with daughter.   In ED, hypotensive to 86/47.  WBC 25.1 with left shift.  K5.4. Cr 2.16 (was 1.08 on 9/12).  BUN 48.  Albumin  <1.5.  AST 111.  ALT 45.  ALP 388.  Total bili 0.8.  RVP negative.  Lipase 16.  CXR concerning for RLL consolidation.  CT chest, abdomen and pelvis showed RLL and RML consolidation, small bilateral effusion, large volume ascites, anasarca and questionable thickening of ascending and transverse colon as well as right sacral decubitus ulcer with cortical irregularity.  Cultures obtained.  Patient was started on IV fluid.  Received cefepime  and vancomycin  and admitted on Unasyn .  Paracentesis ordered.   The next day, patient remained hypotensive spite IV fluid and IV albumin .  Paracentesis attempted but only able to remove 100 cc due to hypotension.  PCCM consulted.  She was started on IV midodrine .  Continued on IV albumin .  Transferred to progressive care  Patient requested hospice on 9/22.  After discussion with patient and 2 of her daughters over the phone, we have transitioned to full comfort care.   Subjective: Patient had a low temperature last night.  In a lot of  pain.  Repeatedly requesting hospice.  Talked to patient's daughters over the phone earlier this morning, and we have transitioned to full comfort care.   Objective: Vitals:   06/15/24 0030 06/15/24 0330 06/15/24 0630 06/15/24 0900  BP: 102/81 (!) 91/45 (!) 98/42 (!) 106/52  Pulse: (!) 126 99 100   Resp: 12 12 16    Temp: (!) 95 F (35 C) (!) 95 F (35 C) (!) 97.3 F (36.3 C)   TempSrc: Rectal Rectal Oral   SpO2: 93% 100% 99%   Weight:      Height:        Examination:  GENERAL: Appears to be in a lot of pain and distress. HEENT: MMM.  Vision and hearing grossly intact.  NECK: Supple.  No apparent JVD.  RESP:  No IWOB.  Fair aeration bilaterally. CVS:  RRR. Heart sounds normal.  ABD/GI/GU: BS+. Abd soft and full.  Large ascites. MSK/EXT:  Moves extremities.  Edematous all over. SKIN: Stable sacral decubitus and bilateral leg ulcers.  See pictures under media NEURO: AA.  Oriented fairly.  No apparent focal neuro deficit but limited exam  Consultants:  Critical care Interventional radiology  Procedures: Paracentesis with removal of 100 cc fluid  Microbiology summarized: COVID-19, influenza and RSV PCR nonreactive Blood cultures NGTD Peritoneal fluid culture pending  Assessment and plan: End-of-life care/full comfort care: Patient requested hospice.  Discussed with 2 of her daughters over the phone who agrees with full comfort care and use of medication such as opiate.  - Change CODE  STATUS to DNR-comfort - Started IV fentanyl  25 to 50 mcg every 2 hours as needed  Met with patient's family including 2 daughters, son, pastor and grandson at bedside this afternoon.  Still in pain with IV fentanyl  50 mg every 2 hours as needed.  We have escalated to IV Dilaudid  1 to 4 mg every 1 hours as needed.  If no adequate pain control with this, family on board with infusion.  They also agree with discontinuing antibiotics and IV fluid. -Full comfort pathway with IV Dilaudid , Ativan ,  glycopyrrolate  and other as needed RN medications. -It seems there is a palliative meeting scheduled for 2 PM -Anticipate in-hospital death.  Severe sepsis due to aspiration pneumonia/possible spontaneous bacterial peritonitis: Present on admission.  Meets criteria with tachycardia, leukocytosis, lactic acidosis, AKI and hypotension.  Source thought to be aspiration ammonia.  Reportedly had an episode of emesis before arrival.  Imaging concerning for RML and RLL consolidation.  Blood cultures NGTD.  Pro-Cal elevated.  Peritoneal fluid with elevated neutrophils but culture negative. - Now full comfort care.  Anasarca/large ascites: Exam and imaging with large ascites.  Likely due to cholangiocarcinoma.  She is not tender.  -S/p paracentesis with removal of only 100 cc fluid due to hypotension.   Gallbladder cancer: Diagnosed in 03/2023.  Followed by oncology, Dr. Dempsey.  She was switched to maintenance therapy with durvalumab  on 11/22/2023.  Last dose seems to be on 06/01/2024.  IDDM-2: A1c 4.9%.  AKI on CKD-3A: her Cr was 1.08 on 9/12.  No obstruction on CT.  Not making urine.  Hypotension: Low BP with maps in 40s and 50s.  Mentating well.  Normocytic anemia: Hemoglobin dropped about 3 g.  No overt bleeding.   Paroxysmal A-fib: Rate controlled.  Not on anticoagulation due to GI bleed.  Hyperkalemia: Resolved.  Hyponatremia: Mild  Leukocytosis/bandemia  Non-anion anion gap metabolic acidosis   Advance care planning: See above.  Failure to thrive/hypoalbuminemia Body mass index is 22.98 kg/m.   Unstageable pressure injury of sacrum, right ankle, left foot: Present on arrival. Wound 04/29/24 0958 Pressure Injury Coccyx Right Stage 4 - Full thickness tissue loss with exposed bone, tendon or muscle. (Active)     Wound 04/29/24 1004 Pressure Injury Sacrum Medial Stage 4 - Full thickness tissue loss with exposed bone, tendon or muscle. (Active)     Wound 06/14/24 0130 Pressure Injury  Ankle Right;Lateral Unstageable - Full thickness tissue loss in which the base of the injury is covered by slough (yellow, tan, gray, green or brown) and/or eschar (tan, brown or black) in the wound bed. (Active)     Wound 06/14/24 0130 Pressure Injury Ankle Left;Lateral Unstageable - Full thickness tissue loss in which the base of the injury is covered by slough (yellow, tan, gray, green or brown) and/or eschar (tan, brown or black) in the wound bed. (Active)     Wound 06/13/24 0130 Pressure Injury Foot Left;Lateral Unstageable - Full thickness tissue loss in which the base of the injury is covered by slough (yellow, tan, gray, green or brown) and/or eschar (tan, brown or black) in the wound bed. (Active)   DVT prophylaxis:  Patient is full comfort care.  Code Status: DNR-comfort. Family Communication: Updated patient's daughters, son, pastor and grandson at bedside Level of care: Palliative Care Status is: Inpatient Remains inpatient appropriate because: End-of-life care.   Final disposition: Anticipated in hospital death  CRITICAL CARE Performed by: Mignon ONEIDA Bump   Total critical care time: 85 minutes  Critical care time was exclusive of separately billable procedures and treating other patients.  Critical care was necessary to treat or prevent imminent or life-threatening deterioration.  Critical care was time spent personally by me on the following activities: development of treatment plan with patient and/or surrogate as well as nursing, discussions with consultants, evaluation of patient's response to treatment, examination of patient, obtaining history from patient or surrogate, ordering and performing treatments and interventions, ordering and review of laboratory studies, ordering and review of radiographic studies, pulse oximetry and re-evaluation of patient's condition.     Sch Meds:  Scheduled Meds:   Continuous Infusions:   PRN Meds:.artificial tears,  glycopyrrolate  **OR** glycopyrrolate  **OR** glycopyrrolate , haloperidol  **OR** haloperidol  **OR** haloperidol  lactate, HYDROmorphone  (DILAUDID ) injection, LORazepam  **OR** LORazepam  **OR** LORazepam , ondansetron  **OR** ondansetron  (ZOFRAN ) IV  Antimicrobials: Anti-infectives (From admission, onward)    Start     Dose/Rate Route Frequency Ordered Stop   06/14/24 2000  ceFEPIme  (MAXIPIME ) 2 g in sodium chloride  0.9 % 100 mL IVPB  Status:  Discontinued        2 g 200 mL/hr over 30 Minutes Intravenous Every 24 hours 06/14/24 1504 06/15/24 1307   06/14/24 1530  linezolid  (ZYVOX ) IVPB 600 mg  Status:  Discontinued        600 mg 300 mL/hr over 60 Minutes Intravenous Every 12 hours 06/14/24 1444 06/15/24 1307   06/14/24 1530  metroNIDAZOLE  (FLAGYL ) IVPB 500 mg  Status:  Discontinued        500 mg 100 mL/hr over 60 Minutes Intravenous Every 12 hours 06/14/24 1444 06/15/24 1307   06/13/24 2315  Ampicillin -Sulbactam (UNASYN ) 3 g in sodium chloride  0.9 % 100 mL IVPB  Status:  Discontinued        3 g 200 mL/hr over 30 Minutes Intravenous Every 12 hours 06/13/24 2308 06/14/24 1444   06/13/24 1915  vancomycin  (VANCOCIN ) IVPB 1000 mg/200 mL premix        1,000 mg 200 mL/hr over 60 Minutes Intravenous  Once 06/13/24 1913 06/13/24 2105   06/13/24 1915  ceFEPIme  (MAXIPIME ) 2 g in sodium chloride  0.9 % 100 mL IVPB        2 g 200 mL/hr over 30 Minutes Intravenous  Once 06/13/24 1913 06/13/24 1955        I have personally reviewed the following labs and images: CBC: Recent Labs  Lab 06/13/24 1845 06/14/24 0609 06/15/24 0500  WBC 25.1* 25.0* 25.9*  NEUTROABS 23.4* 23.1*  --   HGB 11.1* 10.2* 7.7*  HCT 34.1* 31.3* 23.4*  MCV 87.7 87.9 87.3  PLT 287 264 197   BMP &GFR Recent Labs  Lab 06/13/24 1845 06/14/24 0609 06/15/24 0500  NA 134* 134* 135  K 5.4* 4.7 4.1  CL 105 108 107  CO2 17* 17* 16*  GLUCOSE 85 87 88  BUN 48* 47* 45*  CREATININE 2.16* 2.02* 2.02*  CALCIUM  8.3* 8.1* 9.0  MG   --   --  2.1  PHOS  --   --  3.0   Estimated Creatinine Clearance: 19.7 mL/min (A) (by C-G formula based on SCr of 2.02 mg/dL (H)). Liver & Pancreas: Recent Labs  Lab 06/13/24 1845 06/14/24 0609 06/15/24 0500  AST 111* 98* 70*  ALT 42 39 26  ALKPHOS 388* 346* 274*  BILITOT 0.8 0.7 1.7*  PROT 3.8* 3.2* 4.6*  ALBUMIN  <1.5* <1.5* 3.6   Recent Labs  Lab 06/13/24 1845  LIPASE 16   No results for input(s): AMMONIA in the last 168  hours. Diabetic: No results for input(s): HGBA1C in the last 72 hours. Recent Labs  Lab 06/14/24 1602 06/14/24 2102 06/15/24 0009 06/15/24 0404 06/15/24 0750  GLUCAP 79 100* 103* 87 115*   Cardiac Enzymes: Recent Labs  Lab 06/14/24 0609 06/15/24 0500  CKTOTAL 129 33*   No results for input(s): PROBNP in the last 8760 hours. Coagulation Profile: Recent Labs  Lab 06/13/24 1845  INR 1.3*   Thyroid  Function Tests: No results for input(s): TSH, T4TOTAL, FREET4, T3FREE, THYROIDAB in the last 72 hours. Lipid Profile: No results for input(s): CHOL, HDL, LDLCALC, TRIG, CHOLHDL, LDLDIRECT in the last 72 hours. Anemia Panel: No results for input(s): VITAMINB12, FOLATE, FERRITIN, TIBC, IRON , RETICCTPCT in the last 72 hours. Urine analysis:    Component Value Date/Time   COLORURINE YELLOW 06/14/2024 1830   APPEARANCEUR HAZY (A) 06/14/2024 1830   LABSPEC 1.014 06/14/2024 1830   PHURINE 5.0 06/14/2024 1830   GLUCOSEU >=500 (A) 06/14/2024 1830   HGBUR NEGATIVE 06/14/2024 1830   BILIRUBINUR NEGATIVE 06/14/2024 1830   KETONESUR NEGATIVE 06/14/2024 1830   PROTEINUR NEGATIVE 06/14/2024 1830   NITRITE NEGATIVE 06/14/2024 1830   LEUKOCYTESUR NEGATIVE 06/14/2024 1830   Sepsis Labs: Invalid input(s): PROCALCITONIN, LACTICIDVEN  Microbiology: Recent Results (from the past 240 hours)  Resp panel by RT-PCR (RSV, Flu A&B, Covid)     Status: None   Collection Time: 06/13/24  6:18 PM   Specimen: Nasal Swab   Result Value Ref Range Status   SARS Coronavirus 2 by RT PCR NEGATIVE NEGATIVE Final   Influenza A by PCR NEGATIVE NEGATIVE Final   Influenza B by PCR NEGATIVE NEGATIVE Final    Comment: (NOTE) The Xpert Xpress SARS-CoV-2/FLU/RSV plus assay is intended as an aid in the diagnosis of influenza from Nasopharyngeal swab specimens and should not be used as a sole basis for treatment. Nasal washings and aspirates are unacceptable for Xpert Xpress SARS-CoV-2/FLU/RSV testing.  Fact Sheet for Patients: BloggerCourse.com  Fact Sheet for Healthcare Providers: SeriousBroker.it  This test is not yet approved or cleared by the [place]  FDA and has been authorized for detection and/or diagnosis of SARS-CoV-2 by FDA under an Emergency Use Authorization (EUA). This EUA will remain in effect (meaning this test can be used) for the duration of the COVID-19 declaration under Section 564(b)(1) of the Act, 21 U.S.C. section 360bbb-3(b)(1), unless the authorization is terminated or revoked.     Resp Syncytial Virus by PCR NEGATIVE NEGATIVE Final    Comment: (NOTE) Fact Sheet for Patients: BloggerCourse.com  Fact Sheet for Healthcare Providers: SeriousBroker.it  This test is not yet approved or cleared by the [place]  FDA and has been authorized for detection and/or diagnosis of SARS-CoV-2 by FDA under an Emergency Use Authorization (EUA). This EUA will remain in effect (meaning this test can be used) for the duration of the COVID-19 declaration under Section 564(b)(1) of the Act, 21 U.S.C. section 360bbb-3(b)(1), unless the authorization is terminated or revoked.  Performed at [name], [address], [place], [place] [address]   Culture, blood (routine x 2)     Status: None (Preliminary result)   Collection Time: 06/13/24  6:45 PM   Specimen: BLOOD  Result Value Ref Range  Status   Specimen Description BLOOD SITE NOT SPECIFIED  Final   Special Requests   Final    BOTTLES DRAWN AEROBIC AND ANAEROBIC Blood Culture results may not be optimal due to an inadequate volume of blood received in culture bottles  Culture   Final    NO GROWTH 2 DAYS Performed at [facility], [address] [place], [place] [address]    Report Status PENDING  Incomplete  Culture, blood (routine x 2)     Status: None (Preliminary result)   Collection Time: 06/13/24  6:45 PM   Specimen: BLOOD  Result Value Ref Range Status   Specimen Description BLOOD SITE NOT SPECIFIED  Final   Special Requests   Final    BOTTLES DRAWN AEROBIC AND ANAEROBIC Blood Culture results may not be optimal due to an inadequate volume of blood received in culture bottles   Culture   Final    NO GROWTH 2 DAYS Performed at [facility], [address], [place], [place] [address]    Report Status PENDING  Incomplete  Body fluid culture w Gram Stain     Status: None (Preliminary result)   Collection Time: 06/14/24 10:56 AM   Specimen: Abdomen; Peritoneal Fluid  Result Value Ref Range Status   Specimen Description PERITONEAL  Final   Special Requests ABDOMEN  Final   Gram Stain NO WBC SEEN NO ORGANISMS SEEN   Final   Culture   Final    NO GROWTH < 24 HOURS Performed at [facility], [address], [place], [place] [address]    Report Status PENDING  Incomplete    Radiology Studies: US  EKG SITE RITE Result Date: 06/15/2024 If Site Rite image not attached, placement could not be confirmed due to current cardiac rhythm.     Mckinleigh Schuchart T. Lorrine Killilea [facility]  If 7PM-7AM, please contact night-coverage [email or web address] 06/15/2024, 1:29 PM

## 2024-06-15 NOTE — Progress Notes (Signed)
 OT Cancellation Note  Patient Details Name: [name] MRN: [id number] DOB: 09-21-1941   Cancelled Treatment:    Reason Eval/Treat Not Completed: Other (comment) (Per nursing staff pt went comfort care. Please reconcult if needed.) Warrick POUR OTR/L  Acute Rehab Services  [phone] office number   Warrick Berber 06/15/2024, 10:01 AM

## 2024-06-15 NOTE — Progress Notes (Signed)
 Bladder scan Q6hr. Bladder scanner reading . Patient uncomfortable, having belly pressure states I feel like I peed. Bed dry no urine output. In and out cath was done 9/21 at 1550 with of output. Foley inserted.

## 2024-06-15 NOTE — TOC Initial Note (Addendum)
 Transition of Care [facility]) - Initial/Assessment Note    Patient Details  Name: [name] MRN: [id number] Date of Birth: 1941/08/02  Transition of Care [facility]) CM/SW Contact:    Lauraine FORBES Saa, LCSWA Phone Number: 06/15/2024, 11:22 AM  Clinical Narrative:                  11:22 AM Per chart review, patient was recently discharged home with [facility]. Patient transitioned to comfort care today. Palliative Care team to conduct GOC with patient and patient's family today. Patient has SNF history with [facility]. Patient has HH history with [facility], and [facility]. Patient has DME (BSC, cane, RW, wound vac, wheelchair) history with [facility] and [facility]. Patient's preferred pharmacy's are [facility] and [facility] [address] TOC will continue to follow and be available to assist.   2:39 PM Per Palliative Care PA consult note, patient is anticipated to have an in hospital death. TOC will continue to follow and be available to assist.  Expected Discharge Plan: Hospice Medical Facility Barriers to Discharge: Other (must enter comment), Continued Medical Work up (GOC)   Patient Goals and CMS Choice Patient states their goals for this hospitalization and ongoing recovery are:: comfort care          Expected Discharge Plan and Services   Discharge Planning Services: CM Consult   Living arrangements for the past 2 months: Single Family Home, Skilled Nursing Facility                                      Prior Living Arrangements/Services Living arrangements for the past 2 months: Single Family Home, Skilled Nursing Facility Lives with:: Facility Resident Patient language and need for interpreter reviewed:: Yes        Need for Family Participation in Patient Care: Yes (Comment)   Current home services: DME, Home RN, Home PT, Home OT, Other (comment) (HH SW) Criminal Activity/Legal Involvement Pertinent to Current  Situation/Hospitalization: No - Comment as needed  Activities of Daily Living   ADL Screening (condition at time of admission) Independently performs ADLs?: No Does the patient have a NEW difficulty with bathing/dressing/toileting/self-feeding that is expected to last >3 days?: Yes (Initiates electronic notice to provider for possible OT consult) Does the patient have a NEW difficulty with getting in/out of bed, walking, or climbing stairs that is expected to last >3 days?: Yes (Initiates electronic notice to provider for possible PT consult) Does the patient have a NEW difficulty with communication that is expected to last >3 days?: No Is the patient deaf or have difficulty hearing?: Yes Does the patient have difficulty seeing, even when wearing glasses/contacts?: No Does the patient have difficulty concentrating, remembering, or making decisions?: No  Permission Sought/Granted Permission sought to share information with : Family Supports Permission granted to share information with : No (Contact information on chart)  Share Information with NAME: Annabella Bouche     Permission granted to share info w Relationship: Daughter  Permission granted to share info w Contact Information: [phone]  Emotional Assessment       Orientation: : Oriented to Self, Oriented to Place, Oriented to  Time, Oriented to Situation Alcohol  / Substance Use: Not Applicable Psych Involvement: No (comment)  Admission diagnosis:  Aspiration pneumonia (HCC) [J69.0] Healthcare-associated pneumonia [J18.9] 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] Patient Active Problem List   Diagnosis Date Noted   Aspiration pneumonia (  HCC) 06/13/2024   Ascites 06/13/2024   Acquired thrombophilia (HCC) 06/03/2024   Protein-calorie malnutrition, severe 04/30/2024   Stage 4 pressure ulcer (HCC) 04/29/2024   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) 04/29/2024   Anemia 04/15/2024   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   Pressure injury of sacral region, stage 4 (HCC) 04/15/2024   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   Paroxysmal atrial fibrillation (HCC) 04/14/2024   Chronic kidney disease, stage 3a (HCC) 04/14/2024   Hyperlipidemia associated with type 2 diabetes mellitus (HCC) 04/14/2024   Sepsis due to pneumonia (HCC) 02/12/2024   Acute metabolic acidosis 02/12/2024   Port-A-Cath in place 05/02/2023   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   Coronary artery disease involving native heart without angina pectoris 04/05/2023   Gastro-esophageal reflux disease without esophagitis 02/17/2022   Iron  deficiency anemia 02/17/2022   Moderate nonproliferative diabetic retinopathy of both eyes without macular edema associated with type 2 diabetes mellitus (HCC) 02/17/2022   Pure hypercholesterolemia 02/17/2022   Skin ulcer of sacrum, limited to breakdown of skin (HCC) 02/17/2022   Thrombocytopenic disorder (HCC) 02/17/2022   Tinnitus 02/17/2022   S/P angiogram of extremity 10/13/2021   Acute kidney injury superimposed on stage 3a chronic kidney disease (HCC) 04/27/2021   Symptomatic anemia 04/27/2021   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  PTCAs with drug coated balloon Lt ant tibial artery and Lt popliteal artery  11/26/2019   Normocytic anemia 08/07/2019   Essential hypertension 03/31/2013   Dyslipidemia 03/31/2013   Type 2 diabetes mellitus with stage 3a chronic kidney disease, without long-term current use of insulin  (HCC) 03/31/2013   Overweight (BMI 25.0-29.9) 03/31/2013   Carotid artery disease (HCC) 03/31/2013   PAD (peripheral artery disease) (HCC) 03/31/2013   PCP:  Chrystal Lamarr RAMAN, MD Pharmacy:   [facility] [address], [place] - [address] [place] [address] Phone: [phone] Fax: [phone]  [facility] [address] [place] [address] Phone: [phone] Fax: [phone]     Social Drivers of Health (SDOH) Social History: SDOH Screenings   Food Insecurity: No Food Insecurity (06/14/2024)  Housing: Low Risk  (06/14/2024)  Transportation Needs: No Transportation Needs (06/14/2024)  Utilities: Not At Risk (06/14/2024)  Depression (PHQ2-9): Low Risk  (05/18/2024)  Social Connections: Moderately Isolated (06/14/2024)  Tobacco Use: Medium Risk (06/13/2024)   SDOH Interventions:     Readmission Risk Interventions    06/04/2024    3:35 PM 04/16/2024    3:13 PM  Readmission Risk Prevention Plan  Transportation Screening Complete Complete  PCP or Specialist Appt within 3-5 Days  Complete  HRI or Home Care Consult  Complete  Social Work Consult for Recovery Care Planning/Counseling  Complete  Medication Review [facility]) Complete Referral to Pharmacy  PCP or Specialist appointment within 3-5 days of discharge Complete   HRI or Home Care Consult Complete   SW Recovery Care/Counseling Consult Complete   Palliative Care Screening Not Applicable   Skilled Nursing Facility Complete

## 2024-06-15 NOTE — Consult Note (Signed)
 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   Cholelithiasis 10/12/2022   Chronic kidney disease, stage 3a (HCC) 04/14/2024   Complication of anesthesia    VERSED - agitated, muscle spasms, jerking , frantic , (never had this occurence in the pas)    Coronary artery disease    Critical lower limb ischemia (HCC) 08/03/2019   Critical limb ischemia     Diabetes mellitus without complication (HCC)    Dysrhythmia    PVC's   Family history of malignant neoplasm of digestive organ 02/17/2022   Gangrene of foot (HCC) 08/07/2019   GERD (gastroesophageal reflux disease)    Heart murmur    History of hiatal hernia    Hyperlipidemia    Hypertension 11/20/2011   ECHO- EF>55% Borderline concentric left ventricular hypertrophy. There is a small calcified mass in the L:A near the LA appendage. No valvular masses seen with associated mitral annular calcification. LA Volume/ BSA27.4 ml/m2 No AS. Right ventricular systolic pressure is elevated at .   Jaundice    Myocardial infarction [facility])    June 2021   Neuromuscular disorder [facility])    neuropathy in bilateral feet   NSTEMI  (non-ST elevated myocardial infarction) (HCC) 03/22/2020   Osteomyelitis of foot, left, acute (HCC) 04/27/2021   Other acute osteomyelitis, left ankle and foot (HCC) 02/17/2022   Peripheral vascular disease (HCC)    Pneumonia    not hosp.    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 female  with past medical history of cholangiocarcinoma, chronic stage IV ulcer with osteomyelitis, CKD III on 06/13/2024 with abdominal pain 2/2 ascites. Patient has been admitted 6 times in the last 6 months, most recently on 9/11 for UGIB. Palliative consulted for goals of care conversation.   SUMMARY OF RECOMMENDATIONS   DNR - comfort per primary attending Dilaudid  infusion started for frequent PRN administration  Symptom Management:  Dilaudid  infusion 1 mg/hr, titrate by 50% if > 2 boluses in an hr Diladid 1 mg PRN bolus  Code Status: DNR - Comfort  Prognosis:  Hours - Days  Discharge Planning:  Anticipated Hospital Death   Discussed with: Kathrin MD, Sixtos RN, Kennyth NP, Tiffany (daughter), Laurel (daughter), Montgomery (son in law),  Dorn (grandson)  Subjective:   Reviewed medical records, received report from team, assessed the patient and then meet at the patient's bedside to discuss diagnosis, prognosis, GOC, EOL wishes disposition and options.  Before meeting with the patient/family, I spent time reviewing the chart notes including H&P, progress notes, interventional radiology notes. I also reviewed vital signs, nursing flowsheets, medication administrations record, labs, and imaging.  I met with: Annabella (daughter) Donnita (daughter) Montgomery (son in law) Dorn (grandson) Patient was resting during visit and woke up visibly in pain and  moaning so she was not able to participate in discussions.    We meet to discuss diagnosis prognosis, GOC, EOL wishes, disposition and options. Concept of Palliative Care was introduced as specialized medical care for people and their families living with serious illness.  If focuses on providing relief from the symptoms and stress of a serious illness.  The goal is to improve quality of life for both the patient and the family. Values and goals of care important to patient and family were attempted to be elicited.  Created space and opportunity for patient  and family to explore thoughts and feelings regarding current medical situation   Natural trajectory and current clinical status were discussed. Questions and concerns addressed. Patient encouraged to call with questions or concerns.    Patient/Family Understanding of Illness: - Family fully understands that the patient's comorbidity including cholangiocarcinoma, new ascites, and stage IV sacral ulcer with osteomyelitis are all factors in the patient's decline  Today's Discussion: - The daughters were reported that during previous conversations the patient and family pursued aggressive care based on the wishes of the patient and they wanted to be supportive of her fully knowing that the prognosis is poor - Family reported that overnight the patient was more uncomfortable and came to a realization that she would like to pursue comfort measures today when speaking with the primary attending - Patient was transitioned to comfort care by primary team and daughters were completely in agreement with the path - Patient was visibly in pain during visit even after PRN medications were started, decision was made along with the family that starting a continuous infusion would be beneficial for the patient  Goals: - Family would like the patient to be as comfortable as she can be  Review of Systems  Unable to perform ROS   Objective:   Primary  Diagnoses: Present on Admission:  Aspiration pneumonia (HCC)  Acute kidney injury superimposed on stage 3a chronic kidney disease (HCC)  Essential hypertension  Type 2 diabetes mellitus with stage 3a chronic kidney disease, without long-term current use of insulin  (HCC)  Gallbladder cancer (HCC)  Stage 4 pressure ulcer (HCC)  Sacral osteomyelitis (HCC)  Paroxysmal atrial fibrillation (HCC)  Acute metabolic acidosis  Sepsis due to pneumonia (HCC)   Vital Signs:  BP (!) 98/42 (BP Location: Right Arm)   Pulse 100   Temp (!) 97.3 F (36.3 C) (Oral)   Resp 16   Ht 5' 5.5 (1.664 m)   Wt 63.6 kg   SpO2 99%   BMI 22.98 kg/m   Physical Exam Constitutional:      General: She is in acute distress.     Appearance: She is ill-appearing.  Psychiatric:        Mood and Affect: Mood is anxious.     Comments: Moaning in pain.     Palliative Assessment/Data: 20%   Thank you for allowing us  to participate in the care of Cydney H  Septer PMT will continue to support holistically.  Billing based on MDM: High  Problems Addressed: One or more chronic illnesses with severe exacerbation, progression, or side effects of treatment.  Amount and/or Complexity of Data: Category 1:Review of prior external note(s) from each unique source and Assessment requiring an independent historian(s) and Category 3:Discussion of management or test interpretation with external physician/other qualified health care professional/appropriate source (not separately reported)  Risks: Parenteral controlled substances and Drug therapy requiring intensive monitoring for toxicity   Detailed review of medical records (labs, imaging, vital signs), medically appropriate exam, discussed with treatment team, counseling and education to patient, family, & staff, documenting clinical information, medication management, coordination of care.  Signed by: Fairy FORBES Shan DEVONNA Palliative Medicine Team  Team Phone # [phone]  (Nights/Weekends)  06/15/2024, 9:07 AM

## 2024-06-15 NOTE — Progress Notes (Signed)
 Patient has several IV antibiotics and albumin  every 2 hrs that need to be run. This has been difficult to keep all IV meds on time due to patient only have 1 IV access at port-a-cath. IV team was unable to insert a peripheral IV d/t ascites.

## 2024-06-15 NOTE — Progress Notes (Signed)
 Med/onc brief note   Chart reviewed, patient is on comfort care now.  I totally agree with the plan.  I stopped by to check on her, she was very lethargic, not able to talk.  I spoke with her daughters at the bedside, offered emotional support. I appreciate the care from our hospitalist team. We anticipate she will pass away in next few days.  Onita Mattock MD 06/15/2024

## 2024-06-15 NOTE — Plan of Care (Signed)

## 2024-06-16 DIAGNOSIS — I10 Essential (primary) hypertension: Secondary | ICD-10-CM | POA: Diagnosis not present

## 2024-06-16 DIAGNOSIS — E1122 Type 2 diabetes mellitus with diabetic chronic kidney disease: Secondary | ICD-10-CM | POA: Diagnosis not present

## 2024-06-16 DIAGNOSIS — C23 Malignant neoplasm of gallbladder: Secondary | ICD-10-CM | POA: Diagnosis not present

## 2024-06-16 DIAGNOSIS — L89154 Pressure ulcer of sacral region, stage 4: Secondary | ICD-10-CM

## 2024-06-16 DIAGNOSIS — J690 Pneumonitis due to inhalation of food and vomit: Secondary | ICD-10-CM | POA: Diagnosis not present

## 2024-06-16 LAB — CYTOLOGY - NON PAP

## 2024-06-16 MED ORDER — GLYCOPYRROLATE 0.2 MG/ML IJ SOLN
0.2000 mg | INTRAMUSCULAR | Status: DC
  Administered 2024-06-16 – 2024-06-17 (×4): 0.2 mg via INTRAVENOUS
  Filled 2024-06-16 (×15): qty 1

## 2024-06-16 MED ORDER — GLYCOPYRROLATE 0.2 MG/ML IJ SOLN
0.2000 mg | INTRAMUSCULAR | Status: DC
  Filled 2024-06-16 (×13): qty 1

## 2024-06-16 MED ORDER — GLYCOPYRROLATE 1 MG PO TABS
1.0000 mg | ORAL_TABLET | ORAL | Status: DC
  Filled 2024-06-16 (×15): qty 1

## 2024-06-16 NOTE — Progress Notes (Signed)
 PROGRESS NOTE  [name] FMW:[id number] DOB: 1941/06/10   PCP: Chrystal Lamarr RAMAN, MD  Patient is from: Home.  Lives with daughter.  DOA: 06/13/2024 LOS: 3  Chief complaints Chief Complaint  Patient presents with   Abdominal Pain   Hypotension     Brief Narrative / Interim history: 83 year old F with PMH of cholangiocarcinoma, stage IV sacral pressure ulcer, chronic sacral osteomyelitis, CKD-3, A-fib not on AC due to GIB, DM-2 and HTN returning with abdominal pain.  Patient was hospitalized from 9/9-9/16 for symptomatic anemia in the setting of hematemesis.  Underwent EGD that showed clotted blood in gastric body, nonbleeding gastric ulcers and erythematous mucosa.  Xarelto  was discontinued.  Therapy recommended SNF but she had no nursing home days left on her insurance and discharged home with daughter.   In ED, hypotensive to 86/47.  WBC 25.1 with left shift.  K5.4. Cr 2.16 (was 1.08 on 9/12).  BUN 48.  Albumin  <1.5.  AST 111.  ALT 45.  ALP 388.  Total bili 0.8.  RVP negative.  Lipase 16.  CXR concerning for RLL consolidation.  CT chest, abdomen and pelvis showed RLL and RML consolidation, small bilateral effusion, large volume ascites, anasarca and questionable thickening of ascending and transverse colon as well as right sacral decubitus ulcer with cortical irregularity.  Cultures obtained.  Patient was started on IV fluid.  Received cefepime  and vancomycin  and admitted on Unasyn .  Paracentesis ordered.   The next day, patient remained hypotensive spite IV fluid and IV albumin .  Paracentesis attempted but only able to remove 100 cc due to hypotension.  PCCM consulted.  She was started on IV midodrine .  Continued on IV albumin .  Transferred to progressive care  Patient requested hospice on 9/22.  After discussion with patient and 2 of her daughters over the phone, we have transitioned to full comfort care.  Anticipate in-hospital death.  Subjective: Seen and examined earlier  this morning.  Patient's daughter and son at bedside.  Son is sleeping.  Patient is not conscious.  Daughter feels she is comfortable.   Objective: Vitals:   06/15/24 0030 06/15/24 0330 06/15/24 0630 06/15/24 0900  BP: 102/81 (!) 91/45 (!) 98/42 (!) 106/52  Pulse: (!) 126 99 100 94  Resp: 12 12 16 10   Temp: (!) 95 F (35 C) (!) 95 F (35 C) (!) 97.3 F (36.3 C) (!) 97.5 F (36.4 C)  TempSrc: Rectal Rectal Oral Oral  SpO2: 93% 100% 99% 98%  Weight:      Height:        Examination:  GENERAL: Unconscious.  No distress. RESP:  No IWOB.   CVS:  RRR. Heart sounds normal.  NEURO: Unconscious and sedated.  Consultants:  Critical care Interventional radiology  Procedures: Paracentesis with removal of 100 cc fluid  Microbiology summarized: COVID-19, influenza and RSV PCR nonreactive Blood cultures NGTD Peritoneal fluid culture pending  Assessment and plan: End-of-life care/full comfort care: See note on 9/22 for details -Appreciate care by palliative medicine.  On Dilaudid  infusion and as needed meds. - Anticipate in hospital death  Severe sepsis due to aspiration pneumonia/possible spontaneous bacterial peritonitis: Present on admission.  Meets criteria with tachycardia, leukocytosis, lactic acidosis, AKI and hypotension.  Source thought to be aspiration ammonia.  Reportedly had an episode of emesis before arrival.  Imaging concerning for RML and RLL consolidation.  Blood cultures NGTD.  Pro-Cal elevated.  Peritoneal fluid with elevated neutrophils but culture negative. - Now full comfort care.  Anasarca/large  ascites: Exam and imaging with large ascites.  Likely due to cholangiocarcinoma.  She is not tender.  -S/p paracentesis with removal of only 100 cc fluid due to hypotension.   Gallbladder cancer: Diagnosed in 03/2023.  Followed by oncology, Dr. Dempsey.  She was switched to maintenance therapy with durvalumab  on 11/22/2023.  Last dose seems to be on 06/01/2024.  IDDM-2: A1c  4.9%.  AKI on CKD-3A: her Cr was 1.08 on 9/12.  No obstruction on CT.  Not making urine.  Hypotension: Low BP with maps in 40s and 50s.  Mentating well.  Normocytic anemia: Hemoglobin dropped about 3 g.  No overt bleeding.   Paroxysmal A-fib: Rate controlled.  Not on anticoagulation due to GI bleed.  Hyperkalemia: Resolved.  Hyponatremia: Mild  Leukocytosis/bandemia  Non-anion anion gap metabolic acidosis   Advance care planning: See above.  Failure to thrive/hypoalbuminemia Body mass index is 22.98 kg/m.   Unstageable pressure injury of sacrum, right ankle, left foot: Present on arrival. Wound 04/29/24 0958 Pressure Injury Coccyx Right Stage 4 - Full thickness tissue loss with exposed bone, tendon or muscle. (Active)     Wound 04/29/24 1004 Pressure Injury Sacrum Medial Stage 4 - Full thickness tissue loss with exposed bone, tendon or muscle. (Active)     Wound 06/14/24 0130 Pressure Injury Ankle Right;Lateral Unstageable - Full thickness tissue loss in which the base of the injury is covered by slough (yellow, tan, gray, green or brown) and/or eschar (tan, brown or black) in the wound bed. (Active)     Wound 06/14/24 0130 Pressure Injury Ankle Left;Lateral Unstageable - Full thickness tissue loss in which the base of the injury is covered by slough (yellow, tan, gray, green or brown) and/or eschar (tan, brown or black) in the wound bed. (Active)     Wound 06/13/24 0130 Pressure Injury Foot Left;Lateral Unstageable - Full thickness tissue loss in which the base of the injury is covered by slough (yellow, tan, gray, green or brown) and/or eschar (tan, brown or black) in the wound bed. (Active)   DVT prophylaxis:  Patient is full comfort care.  Code Status: DNR-comfort. Family Communication: Updated patient's daughter at bedside. Level of care: Palliative Care Status is: Inpatient Remains inpatient appropriate because: End-of-life care.   Final disposition: Anticipated  in hospital death  35 minutes with more than 50% spent in reviewing records, counseling patient/family and coordinating care.    Sch Meds:  Scheduled Meds:  glycopyrrolate   1 mg Oral Q4H   Or   glycopyrrolate   0.2 mg Subcutaneous Q4H   Or   glycopyrrolate   0.2 mg Intravenous Q4H    Continuous Infusions:  HYDROmorphone  (DILAUDID ) 50 mg in sodium chloride  0.9 % 50 mL (1 mg/mL) infusion 1 mg/hr (06/15/24 1553)    PRN Meds:.artificial tears, haloperidol  **OR** haloperidol  **OR** haloperidol  lactate, HYDROmorphone , LORazepam  **OR** LORazepam  **OR** LORazepam , ondansetron  **OR** ondansetron  (ZOFRAN ) IV  Antimicrobials: Anti-infectives (From admission, onward)    Start     Dose/Rate Route Frequency Ordered Stop   06/14/24 2000  ceFEPIme  (MAXIPIME ) 2 g in sodium chloride  0.9 % 100 mL IVPB  Status:  Discontinued        2 g 200 mL/hr over 30 Minutes Intravenous Every 24 hours 06/14/24 1504 06/15/24 1307   06/14/24 1530  linezolid  (ZYVOX ) IVPB 600 mg  Status:  Discontinued        600 mg 300 mL/hr over 60 Minutes Intravenous Every 12 hours 06/14/24 1444 06/15/24 1307   06/14/24 1530  metroNIDAZOLE  (FLAGYL )  IVPB 500 mg  Status:  Discontinued        500 mg 100 mL/hr over 60 Minutes Intravenous Every 12 hours 06/14/24 1444 06/15/24 1307   06/13/24 2315  Ampicillin -Sulbactam (UNASYN ) 3 g in sodium chloride  0.9 % 100 mL IVPB  Status:  Discontinued        3 g 200 mL/hr over 30 Minutes Intravenous Every 12 hours 06/13/24 2308 06/14/24 1444   06/13/24 1915  vancomycin  (VANCOCIN ) IVPB 1000 mg/200 mL premix        1,000 mg 200 mL/hr over 60 Minutes Intravenous  Once 06/13/24 1913 06/13/24 2105   06/13/24 1915  ceFEPIme  (MAXIPIME ) 2 g in sodium chloride  0.9 % 100 mL IVPB        2 g 200 mL/hr over 30 Minutes Intravenous  Once 06/13/24 1913 06/13/24 1955        I have personally reviewed the following labs and images: CBC: Recent Labs  Lab 06/13/24 1845 06/14/24 0609 06/15/24 0500  WBC  25.1* 25.0* 25.9*  NEUTROABS 23.4* 23.1*  --   HGB 11.1* 10.2* 7.7*  HCT 34.1* 31.3* 23.4*  MCV 87.7 87.9 87.3  PLT 287 264 197   BMP &GFR Recent Labs  Lab 06/13/24 1845 06/14/24 0609 06/15/24 0500  NA 134* 134* 135  K 5.4* 4.7 4.1  CL 105 108 107  CO2 17* 17* 16*  GLUCOSE 85 87 88  BUN 48* 47* 45*  CREATININE 2.16* 2.02* 2.02*  CALCIUM  8.3* 8.1* 9.0  MG  --   --  2.1  PHOS  --   --  3.0   Estimated Creatinine Clearance: 19.7 mL/min (A) (by C-G formula based on SCr of 2.02 mg/dL (H)). Liver & Pancreas: Recent Labs  Lab 06/13/24 1845 06/14/24 0609 06/15/24 0500  AST 111* 98* 70*  ALT 42 39 26  ALKPHOS 388* 346* 274*  BILITOT 0.8 0.7 1.7*  PROT 3.8* 3.2* 4.6*  ALBUMIN  <1.5* <1.5* 3.6   Recent Labs  Lab 06/13/24 1845  LIPASE 16   No results for input(s): AMMONIA in the last 168 hours. Diabetic: No results for input(s): HGBA1C in the last 72 hours. Recent Labs  Lab 06/14/24 1602 06/14/24 2102 06/15/24 0009 06/15/24 0404 06/15/24 0750  GLUCAP 79 100* 103* 87 115*   Cardiac Enzymes: Recent Labs  Lab 06/14/24 0609 06/15/24 0500  CKTOTAL 129 33*   No results for input(s): PROBNP in the last 8760 hours. Coagulation Profile: Recent Labs  Lab 06/13/24 1845  INR 1.3*   Thyroid  Function Tests: No results for input(s): TSH, T4TOTAL, FREET4, T3FREE, THYROIDAB in the last 72 hours. Lipid Profile: No results for input(s): CHOL, HDL, LDLCALC, TRIG, CHOLHDL, LDLDIRECT in the last 72 hours. Anemia Panel: No results for input(s): VITAMINB12, FOLATE, FERRITIN, TIBC, IRON , RETICCTPCT in the last 72 hours. Urine analysis:    Component Value Date/Time   COLORURINE YELLOW 06/14/2024 1830   APPEARANCEUR HAZY (A) 06/14/2024 1830   LABSPEC 1.014 06/14/2024 1830   PHURINE 5.0 06/14/2024 1830   GLUCOSEU >=500 (A) 06/14/2024 1830   HGBUR NEGATIVE 06/14/2024 1830   BILIRUBINUR NEGATIVE 06/14/2024 1830   KETONESUR NEGATIVE  06/14/2024 1830   PROTEINUR NEGATIVE 06/14/2024 1830   NITRITE NEGATIVE 06/14/2024 1830   LEUKOCYTESUR NEGATIVE 06/14/2024 1830   Sepsis Labs: Invalid input(s): PROCALCITONIN, LACTICIDVEN  Microbiology: Recent Results (from the past 240 hours)  Resp panel by RT-PCR (RSV, Flu A&B, Covid)     Status: None   Collection Time: 06/13/24  6:18 PM  Specimen: Nasal Swab  Result Value Ref Range Status   SARS Coronavirus 2 by RT PCR NEGATIVE NEGATIVE Final   Influenza A by PCR NEGATIVE NEGATIVE Final   Influenza B by PCR NEGATIVE NEGATIVE Final    Comment: (NOTE) The Xpert Xpress SARS-CoV-2/FLU/RSV plus assay is intended as an aid in the diagnosis of influenza from Nasopharyngeal swab specimens and should not be used as a sole basis for treatment. Nasal washings and aspirates are unacceptable for Xpert Xpress SARS-CoV-2/FLU/RSV testing.  Fact Sheet for Patients: BloggerCourse.com  Fact Sheet for Healthcare Providers: SeriousBroker.it  This test is not yet approved or cleared by the [place]  FDA and has been authorized for detection and/or diagnosis of SARS-CoV-2 by FDA under an Emergency Use Authorization (EUA). This EUA will remain in effect (meaning this test can be used) for the duration of the COVID-19 declaration under Section 564(b)(1) of the Act, 21 U.S.C. section 360bbb-3(b)(1), unless the authorization is terminated or revoked.     Resp Syncytial Virus by PCR NEGATIVE NEGATIVE Final    Comment: (NOTE) Fact Sheet for Patients: BloggerCourse.com  Fact Sheet for Healthcare Providers: SeriousBroker.it  This test is not yet approved or cleared by the [place]  FDA and has been authorized for detection and/or diagnosis of SARS-CoV-2 by FDA under an Emergency Use Authorization (EUA). This EUA will remain in effect (meaning this test can be used) for the duration of  the COVID-19 declaration under Section 564(b)(1) of the Act, 21 U.S.C. section 360bbb-3(b)(1), unless the authorization is terminated or revoked.  Performed at [facility], [address] [place], [place] [address]   Culture, blood (routine x 2)     Status: None (Preliminary result)   Collection Time: 06/13/24  6:45 PM   Specimen: BLOOD  Result Value Ref Range Status   Specimen Description BLOOD SITE NOT SPECIFIED  Final   Special Requests   Final    BOTTLES DRAWN AEROBIC AND ANAEROBIC Blood Culture results may not be optimal due to an inadequate volume of blood received in culture bottles   Culture   Final    NO GROWTH 3 DAYS Performed at [facility], [address], [place], [place] [address]    Report Status PENDING  Incomplete  Culture, blood (routine x 2)     Status: None (Preliminary result)   Collection Time: 06/13/24  6:45 PM   Specimen: BLOOD  Result Value Ref Range Status   Specimen Description BLOOD SITE NOT SPECIFIED  Final   Special Requests   Final    BOTTLES DRAWN AEROBIC AND ANAEROBIC Blood Culture results may not be optimal due to an inadequate volume of blood received in culture bottles   Culture   Final    NO GROWTH 3 DAYS Performed at [facility], [address], [place], [place] [address]    Report Status PENDING  Incomplete  Body fluid culture w Gram Stain     Status: None (Preliminary result)   Collection Time: 06/14/24 10:56 AM   Specimen: Abdomen; Peritoneal Fluid  Result Value Ref Range Status   Specimen Description PERITONEAL  Final   Special Requests ABDOMEN  Final   Gram Stain NO WBC SEEN NO ORGANISMS SEEN   Final   Culture   Final    NO GROWTH 2 DAYS Performed at [facility], [address], [place], [place] [address]    Report Status PENDING  Incomplete    Radiology Studies: No results found.  Dresean Beckel T. Haston Casebolt [facility]  If 7PM-7AM, please contact night-coverage [email or web address] 06/16/2024, 4:19  PM

## 2024-06-16 NOTE — Progress Notes (Signed)
 Daily Progress Note   Date: 06/16/2024   Patient Name: [name]  DOB: Feb 15, 1941  MRN: [id number]  Age / Sex: 83 y.o., female  Attending Physician: Kathrin Mignon DASEN, MD Primary Care Physician: Chrystal Lamarr RAMAN, MD Admit Date: 06/13/2024 Length of Stay: 3 days  Reason for Follow-up: Terminal Care  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 - MOSE procedure    Carotid artery disease 03/31/2013   Bilateral, by screening ultrasound (moderate on right, mild on left) - 2014     Cellulitis in diabetic foot (HCC) 04/27/2021   Cellulitis of left lower extremity    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   Cholelithiasis 10/12/2022   Chronic kidney disease, stage 3a (HCC) 04/14/2024   Complication of anesthesia    VERSED - agitated, muscle spasms, jerking , frantic , (never had this occurence in the pas)    Coronary artery disease    Critical lower limb ischemia (HCC) 08/03/2019   Critical limb ischemia     Diabetes mellitus without complication (HCC)    Dysrhythmia    PVC's   Family history of malignant neoplasm of digestive organ 02/17/2022   Gangrene of foot (HCC) 08/07/2019   GERD (gastroesophageal reflux disease)    Heart murmur    History of hiatal hernia    Hyperlipidemia    Hypertension 11/20/2011   ECHO- EF>55% Borderline concentric left ventricular hypertrophy. There is a small calcified mass in the L:A near the LA appendage. No valvular masses seen with associated mitral annular calcification. LA Volume/ BSA27.4 ml/m2 No AS. Right ventricular systolic pressure is elevated at .   Jaundice    Myocardial infarction [facility])    June 2021   Neuromuscular disorder [facility])    neuropathy in bilateral feet   NSTEMI (non-ST elevated myocardial infarction) (HCC) 03/22/2020   Osteomyelitis of  foot, left, acute (HCC) 04/27/2021   Other acute osteomyelitis, left ankle and foot (HCC) 02/17/2022   Peripheral vascular disease    Pneumonia    not hosp.    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 female  with past medical history of cholangiocarcinoma, chronic stage IV ulcer with osteomyelitis, CKD III on 06/13/2024 with abdominal pain 2/2 ascites. Patient has been admitted 6 times in the last 6 months, most recently on 9/11 for UGIB. Palliative consulted for goals of care conversation.   - Irregular respirations with periods of apnea up to 10 seconds at a time - Remains on 4 L/min [place], will continue for comfort as I do not think it is going to prolong her that much  SUMMARY OF RECOMMENDATIONS   DNR - comfort per primary attending Dilaudid  infusion started for frequent PRN administration Scheduled robinul  started  Symptom Management:  Dilaudid  infusion 1 mg/hr, titrate by 50% if > 2 boluses in an hr Dilaudid  1 mg PRN bolus Robinul  0.2 mg Q4  Code Status: DNR - Comfort  Prognosis: Hours - Days  Discharge Planning: Anticipated Hospital Death  Discussed with: Gonfa  MD, Lonzell PEAK, Kennyth NP, Tiffany (daughter), Montgomery (son in law)  Subjective:   Subjective: Chart Reviewed. Updates received. Patient Assessed. Created space and opportunity for patient  and family to explore thoughts and feelings regarding current medical situation.  Today's Discussion: Today before meeting with the patient/family, I reviewed the chart notes. I also reviewed vital signs, nursing flowsheets, medication administrations record, labs, and imaging.  - Met with daughter and son at bedside, they report patient is now more comfortable since the dilaudid   infusion was started and are happy  Review of Systems  Unable to perform ROS   Objective:   Primary Diagnoses: Present on Admission:  Aspiration pneumonia (HCC)  Acute kidney injury superimposed on stage 3a chronic kidney disease (HCC)  Essential hypertension  Type 2 diabetes mellitus with stage 3a chronic kidney disease, without long-term current use of insulin  (HCC)  Gallbladder cancer (HCC)  Stage 4 pressure ulcer (HCC)  Sacral osteomyelitis (HCC)  Paroxysmal atrial fibrillation (HCC)  Acute metabolic acidosis  Sepsis due to pneumonia (HCC)   Vital Signs:  BP (!) 106/52 (BP Location: Right Arm)   Pulse 94   Temp (!) 97.5 F (36.4 C) (Oral)   Resp 10   Ht 5' 5.5 (1.664 m)   Wt 63.6 kg   SpO2 98%   BMI 22.98 kg/m   Physical Exam Constitutional:      Appearance: She is ill-appearing.     Comments: Somnolent  HENT:     Head: Normocephalic.  Pulmonary:     Comments: Irregular respirations with long periods of apnea    Palliative Assessment/Data: 10%   Existing Vynca/ACP Documentation: None  Thank you for allowing us  to participate in the care of Roshini H Oliva PMT will continue to support holistically.  Billing based on MDM: High  Problems Addressed: One or more chronic illnesses with severe exacerbation, progression, or side effects of treatment.  Amount and/or Complexity of Data: Category 1:Review of prior external note(s) from each unique source and Assessment requiring an independent historian(s) and Category 3:Discussion of management or test interpretation with external physician/other qualified health care professional/appropriate source (not separately reported)  Risks: Parenteral controlled substances   Detailed review of medical records (labs, imaging, vital signs), medically appropriate exam, discussed with treatment team, counseling and education to patient, family, & staff, documenting clinical information, medication management, coordination  of care  Fairy FORBES Shan DEVONNA  Palliative Medicine Team  Team Phone # [phone] (Nights/Weekends) 06/16/2024 10:49 AM

## 2024-06-16 NOTE — Plan of Care (Signed)
Pt is comfort care.

## 2024-06-16 NOTE — Plan of Care (Signed)
  Problem: Pain Managment: Goal: General experience of comfort will improve and/or be controlled Outcome: Progressing

## 2024-06-16 NOTE — TOC Progression Note (Signed)
 Transition of Care [facility]) - Progression Note    Patient Details  Name: [name] MRN: [id number] Date of Birth: 01-31-1941  Transition of [facility]) CM/SW Contact  Lauraine FORBES Saa, LCSWA Phone Number: 06/16/2024, 12:49 PM  Clinical Narrative:     12:49 PM Per Palliative Care PA, patient is not medically stable for transfer to hospice facility or home with hospice due to long apneic periods. Patient remains comfort care and is anticipated to have an in hospital death. TOC will continue to follow and be available to assist.   Expected Discharge Plan: Hospice Medical Facility Barriers to Discharge: Other (must enter comment), Continued Medical Work up (GOC)               Expected Discharge Plan and Services   Discharge Planning Services: CM Consult   Living arrangements for the past 2 months: Single Family Home, Skilled Nursing Facility                                       Social Drivers of Health (SDOH) Interventions SDOH Screenings   Food Insecurity: No Food Insecurity (06/14/2024)  Housing: Low Risk  (06/14/2024)  Transportation Needs: No Transportation Needs (06/14/2024)  Utilities: Not At Risk (06/14/2024)  Depression (PHQ2-9): Low Risk  (05/18/2024)  Social Connections: Moderately Isolated (06/14/2024)  Tobacco Use: Medium Risk (06/13/2024)    Readmission Risk Interventions    06/04/2024    3:35 PM 04/16/2024    3:13 PM  Readmission Risk Prevention Plan  Transportation Screening Complete Complete  PCP or Specialist Appt within 3-5 Days  Complete  HRI or Home Care Consult  Complete  Social Work Consult for Recovery Care Planning/Counseling  Complete  Medication Review [employer]) Complete Referral to Pharmacy  PCP or Specialist appointment within 3-5 days of discharge Complete   HRI or Home Care Consult Complete   SW Recovery Care/Counseling Consult Complete   Palliative Care Screening Not Applicable   Skilled Nursing Facility Complete

## 2024-06-17 DIAGNOSIS — K652 Spontaneous bacterial peritonitis: Secondary | ICD-10-CM | POA: Diagnosis not present

## 2024-06-17 DIAGNOSIS — J690 Pneumonitis due to inhalation of food and vomit: Secondary | ICD-10-CM | POA: Diagnosis not present

## 2024-06-17 DIAGNOSIS — Z515 Encounter for palliative care: Secondary | ICD-10-CM

## 2024-06-17 DIAGNOSIS — L89154 Pressure ulcer of sacral region, stage 4: Secondary | ICD-10-CM | POA: Diagnosis not present

## 2024-06-17 DIAGNOSIS — R188 Other ascites: Secondary | ICD-10-CM | POA: Diagnosis not present

## 2024-06-17 DIAGNOSIS — Z66 Do not resuscitate: Secondary | ICD-10-CM | POA: Diagnosis not present

## 2024-06-17 LAB — BODY FLUID CULTURE W GRAM STAIN
Culture: NO GROWTH
Gram Stain: NONE SEEN

## 2024-06-17 NOTE — Progress Notes (Signed)
 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 - MOSE procedure    Carotid artery disease 03/31/2013   Bilateral, by screening ultrasound (moderate on right, mild on left) - 2014     Cellulitis in diabetic foot (HCC) 04/27/2021   Cellulitis of left lower extremity    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   Cholelithiasis 10/12/2022   Chronic kidney disease, stage 3a (HCC) 04/14/2024   Complication of anesthesia    VERSED - agitated, muscle spasms, jerking , frantic , (never had this occurence in the pas)    Coronary artery disease    Critical lower limb ischemia (HCC) 08/03/2019   Critical limb ischemia     Diabetes mellitus without complication (HCC)    Dysrhythmia    PVC's   Family history of malignant neoplasm of digestive organ 02/17/2022   Gangrene of foot (HCC) 08/07/2019   GERD (gastroesophageal reflux disease)    Heart murmur    History of hiatal hernia    Hyperlipidemia    Hypertension 11/20/2011   ECHO- EF>55% Borderline concentric left ventricular hypertrophy. There is a small calcified mass in the L:A near the LA appendage. No valvular masses seen with associated mitral annular calcification. LA Volume/ BSA27.4 ml/m2 No AS. Right ventricular systolic pressure is elevated at .   Jaundice    Myocardial infarction [facility])    June 2021   Neuromuscular disorder [facility])    neuropathy in bilateral feet   NSTEMI (non-ST elevated myocardial infarction) (HCC) 03/22/2020   Osteomyelitis of foot,  left, acute (HCC) 04/27/2021   Other acute osteomyelitis, left ankle and foot (HCC) 02/17/2022   Peripheral vascular disease    Pneumonia    not hosp.    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 female  with past medical history of cholangiocarcinoma, chronic stage IV ulcer with osteomyelitis, CKD III on 06/13/2024 with abdominal pain 2/2 ascites. Patient has been admitted 6 times in the last 6 months, most recently on 9/11 for UGIB. Palliative consulted for goals of care conversation.   - Irregular respirations with periods of apnea up to 20 seconds at a time, breaths are more shallow compared to previous visit - Titrated oxygen  to 2 L/min [place] with plans to discontinue when family is ready  SUMMARY OF RECOMMENDATIONS   DNR - comfort per primary attending Dilaudid  infusion started for frequent PRN administration Scheduled robinul  started Titrate off oxygen , adjust dilaudid  infusion if needed for air hunger  Symptom Management:  Dilaudid  infusion 1 mg/hr, titrate by 50% if > 2 boluses in an hr Dilaudid  1 mg PRN bolus Robinul  0.2 mg Q4  Code Status: DNR - Comfort  Prognosis: Hours - Days  Discharge Planning: Anticipated Hospital Death  Discussed with: Laurence MD, Henrene RN, Kennyth NP, Tiffany (daughter), Montgomery (son), Donnita (daughter)  Subjective:   Subjective: Chart Reviewed. Updates received. Patient Assessed. Created space and opportunity for patient  and family to explore thoughts and feelings regarding current medical situation.  Today's Discussion: Today before meeting with the patient/family, I reviewed the chart notes. I also reviewed vital signs, nursing flowsheets, medication administrations record, labs, and imaging.  -  Educated family on signs of end of life and time frame - Discussed that oxygen  may be prolonging her at this stage and were okay with titrating oxygen  off and uptitrating dilaudid  infusion or give bolus if air hunger is visible  Review of Systems  Unable to perform ROS   Objective:   Primary Diagnoses: Present on Admission:  Aspiration pneumonia (HCC)  Acute kidney injury superimposed on stage 3a chronic kidney disease (HCC)  Essential hypertension  Type 2 diabetes mellitus with stage 3a chronic kidney disease, without long-term current use of insulin  (HCC)  Gallbladder cancer (HCC)  Stage 4 pressure ulcer (HCC)  Sacral osteomyelitis (HCC)  Paroxysmal atrial fibrillation (HCC)  Acute metabolic acidosis  Sepsis due to pneumonia (HCC)   Vital Signs:  BP (!) 106/52 (BP Location: Right Arm)   Pulse 94   Temp (!) 97.5 F (36.4 C) (Oral)   Resp 10   Ht 5' 5.5 (1.664 m)   Wt 63.6 kg   SpO2 98%   BMI 22.98 kg/m   Physical Exam Constitutional:      Appearance: She is ill-appearing.     Comments: Somnolent  HENT:     Head: Normocephalic.  Pulmonary:     Comments: Irregular respirations with long periods of apnea    Palliative Assessment/Data: 10%   Existing Vynca/ACP Documentation: None  Thank you for allowing us  to participate in the care of [name] PMT will continue to support holistically.  Billing based on MDM: High  Problems Addressed: One or more chronic illnesses with severe exacerbation, progression, or side effects of treatment.  Amount and/or Complexity of Data: Category 1:Review of prior external note(s) from each unique source and Assessment requiring an independent historian(s) and Category 3:Discussion of management or test interpretation with external physician/other qualified health care professional/appropriate source (not separately reported)  Risks: Parenteral controlled substances   Detailed review of medical records (labs, imaging,  vital signs), medically appropriate exam, discussed with treatment team, counseling and education to patient, family, & staff, documenting clinical information, medication management, coordination of care  Baldwin Racicot E Deontez Klinke, PA-C  Palliative Medicine Team  Team Phone # [phone] (Nights/Weekends) 06/17/2024 12:34 PM

## 2024-06-17 NOTE — Plan of Care (Signed)
  Problem: Education: Goal: Ability to describe self-care measures that may prevent or decrease complications (Diabetes Survival Skills Education) will improve Outcome: Not Met (add Reason) Goal: Individualized Educational Video(s) Outcome: Not Met (add Reason)   Problem: Coping: Goal: Ability to adjust to condition or change in health will improve Outcome: Not Met (add Reason)   Problem: Fluid Volume: Goal: Ability to maintain a balanced intake and output will improve Outcome: Not Met (add Reason)   Problem: Health Behavior/Discharge Planning: Goal: Ability to identify and utilize available resources and services will improve Outcome: Not Met (add Reason) Goal: Ability to manage health-related needs will improve Outcome: Not Met (add Reason)   Problem: Metabolic: Goal: Ability to maintain appropriate glucose levels will improve Outcome: Not Met (add Reason)   Problem: Nutritional: Goal: Maintenance of adequate nutrition will improve Outcome: Not Met (add Reason) Goal: Progress toward achieving an optimal weight will improve Outcome: Not Met (add Reason)   Problem: Skin Integrity: Goal: Risk for impaired skin integrity will decrease Outcome: Not Met (add Reason)   Problem: Tissue Perfusion: Goal: Adequacy of tissue perfusion will improve Outcome: Not Met (add Reason)   Problem: Education: Goal: Knowledge of General Education information will improve Description: Including pain rating scale, medication(s)/side effects and non-pharmacologic comfort measures Outcome: Not Met (add Reason)   Problem: Health Behavior/Discharge Planning: Goal: Ability to manage health-related needs will improve Outcome: Not Met (add Reason)   Problem: Clinical Measurements: Goal: Ability to maintain clinical measurements within normal limits will improve Outcome: Not Met (add Reason) Goal: Will remain free from infection Outcome: Not Met (add Reason) Goal: Diagnostic test results will  improve Outcome: Not Met (add Reason) Goal: Respiratory complications will improve Outcome: Not Met (add Reason) Goal: Cardiovascular complication will be avoided Outcome: Not Met (add Reason)   Problem: Activity: Goal: Risk for activity intolerance will decrease Outcome: Not Met (add Reason)   Problem: Nutrition: Goal: Adequate nutrition will be maintained Outcome: Not Met (add Reason)   Problem: Coping: Goal: Level of anxiety will decrease Outcome: Not Met (add Reason)   Problem: Elimination: Goal: Will not experience complications related to bowel motility Outcome: Not Met (add Reason) Goal: Will not experience complications related to urinary retention Outcome: Not Met (add Reason)   Problem: Pain Managment: Goal: General experience of comfort will improve and/or be controlled Outcome: Not Met (add Reason)   Problem: Safety: Goal: Ability to remain free from injury will improve Outcome: Not Met (add Reason)   Problem: Skin Integrity: Goal: Risk for impaired skin integrity will decrease Outcome: Not Met (add Reason)   Problem: Education: Goal: Knowledge of the prescribed therapeutic regimen will improve Outcome: Not Met (add Reason)   Problem: Coping: Goal: Ability to identify and develop effective coping behavior will improve Outcome: Not Met (add Reason)   Problem: Clinical Measurements: Goal: Quality of life will improve Outcome: Not Met (add Reason)   Problem: Respiratory: Goal: Verbalizations of increased ease of respirations will increase Outcome: Not Met (add Reason)   Problem: Role Relationship: Goal: Family's ability to cope with current situation will improve Outcome: Not Met (add Reason) Goal: Ability to verbalize concerns, feelings, and thoughts to partner or family member will improve Outcome: Not Met (add Reason)

## 2024-06-17 NOTE — Assessment & Plan Note (Addendum)
 Prior to 06/17/24 Pt decided she wanted to switch to comfort care measures on 06-15-2024. Palliative care consulted. Started on IV dilaudid . IV abx stopped. Code status changed to DNR/Comfort care.

## 2024-06-17 NOTE — Progress Notes (Signed)
 Patient arrived to the unit from [facility]. Pt asleep. Pt is comfort care, family with patient requested for her not to be repositioned or get vitals taken due to her being comfortable and not wanting to disturb. No concerns noted at this time.

## 2024-06-17 NOTE — Progress Notes (Signed)
 PROGRESS NOTE    [name]  FMW:[id number] DOB: [birth year] DOA: 06/13/2024 PCP: Chrystal Lamarr RAMAN, MD  Subjective: Pt seen and examined. Met with pt's children at bedside. Pt's son montgomery was sleeping in recliner. Spoke with both dtr clellie and tiffany at bedside. Pt is peacefully on IV dilaudid  gtts. Periods of apnea 10-15 secs at a time. Dtrs do not think pt is in any pain at all. They understand pt will die in the hospital. Dtr are thankful that pt made the decision to pursue comfort care by herself and did not make her dtrs make that decision.   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  She was discharged from the hospital on 06/09/2024 after spending about a week in the hospital due to an upper GI bleed.  She had a upper endoscopy on 06/04/2024 that showed clotted blood in the gastric body.  There was diffuse erythematous mucosa found within the cardia, gastric fundus, greater body and essentially entire stomach.  She was discharged to home on Protonix  and Carafate .  Patient states that the Carafate  is making her nauseous.  She had several bouts of nausea and vomiting on 06/12/2027.  Patient is pretty confident she aspirated when she was vomiting.  She has had some diarrhea today but none in the ER.  Patient was initially going to be placed back at [facility] nursing home when she was discharged but due to no available nursing home days left on her insurance, the family decided to take her home.  She is currently residing with her daughter Annabella.  EMS was called out to the house today due to the patient's abdominal pain.  Blood pressure recorded by EMS  was 122/74 heart rate 52 pulse oximetry 94% on room air.  Arrival to the ER, temp 97.6 heart rate 95 initial blood pressure was 86/47.  She was given 2 L of IV fluids with good response in her blood pressure.  White count 25.1, hemoglobin 11.1, platelets of 287  Sodium 134, potassium 5.4, bicarb 17, BUN of 48, creatinine 2.16, calcium  8.3  Total protein 3.8, albumin  less than 1.5, AST of 111, ALT 42, alk phos 388, total bili 0.8  COVID-negative, RSV negative, influenza negative  Lipase of 16  Portable chest x-ray showed right lower lobe consolidation  CT chest abdomen pelvis demonstrated right lower lobe and right middle lobe consolidation.  Small bilateral pleural effusions.  Large volume ascites.  Diffuse body wall edema.  Questionable wall thickening of the ascending and transverse colon.  There is a right sacral decubitus ulcer with cortical irregularity.    Patient given cefepime  and vancomycin .  [facility] consulted for admission.  Significant Events: Admitted 06/13/2024 for aspiration pneumonia. 06-14-2024 hypotensive while getting paracentesis. Paracentesis cell count suggestive of spontaneous bacterial peritonitis. PCCM consulted due to persistent hypotension despite IVF and IV colloid resuscitation 06-15-2024 palliative care consulted. Pt may DNR/Comfort care. Oncology agrees with comfort care plan. Started on IV dilaudid   Admission Labs: White count 25.1, hemoglobin 11.1, platelets of 287 Sodium 134, potassium 5.4, bicarb 17, BUN of 48, creatinine 2.16, calcium  8.3 Total protein 3.8, albumin  less than 1.5, AST of 111, ALT 42, alk phos 388, total bili 0.8 COVID-negative, RSV  negative, influenza negative Lipase of 16  Admission Imaging Studies: Portable chest x-ray showed right lower lobe consolidation CT chest abdomen pelvis demonstrated right lower lobe and right middle lobe consolidation.  Small bilateral pleural effusions.  Large volume ascites.  Diffuse body  wall edema.  Questionable wall thickening of the ascending and transverse colon.  There is a right sacral decubitus ulcer with cortical irregularity.    Significant Labs: Paracentesis cell count. WBC 454. 82% neutrophil. Absolute  neutrophil count 367 Paracentesis cytology negative for malignant cells  Significant Imaging Studies:   Antibiotic Therapy: Anti-infectives (From admission, onward)    Start     Dose/Rate Route Frequency Ordered Stop   06/13/24 1915  vancomycin  (VANCOCIN ) IVPB 1000 mg/200 mL premix        1,000 mg 200 mL/hr over 60 Minutes Intravenous  Once 06/13/24 1913 06/13/24 2105   06/13/24 1915  ceFEPIme  (MAXIPIME ) 2 g in sodium chloride  0.9 % 100 mL IVPB        2 g 200 mL/hr over 30 Minutes Intravenous  Once 06/13/24 1913 06/13/24 1955       Procedures: paracentesis  Consultants: PCCM Palliative care    Assessment and Plan: * Aspiration pneumonia (HCC) June 13, 2024 admit to MedSurg bed.  Given the patient's known and confirmed history of vomiting on June 11, 2024, we will continue IV antibiotic therapy with Unasyn .  Patient has drug allergy listed in her chart to Augmentin that causes hyperkalemia.  Have never seen that happen before.  May need to watch her serum potassium after transition to p.o. Augmentin.  If she does not become hyperkalemic, this drug allergy can be removed from her profile.  Fortunately she is not requiring any supplemental oxygen .  Prior to 06/17/24 pt treated with IV ABX. Pt decided she wanted to switch to comfort care measures on 82-22-2025. Palliative care consulted. Started on IV dilaudid . IV abx stopped. Code status changed to DNR/Comfort care.   Terminal care Prior to 06/17/24 Pt decided she wanted to switch to comfort care measures on 82-22-2025. Palliative care consulted. Started on IV dilaudid . IV abx stopped. Code status changed to DNR/Comfort care.  Spontaneous bacterial peritonitis (HCC) Prior to 06/17/24 pt had  paracentesis performed on 06-14-2024 that met criteria for spontaneous bacterial peritonitis. Pt continue with IV ABX. Pt decided she wanted to switch to comfort care measures on 82-22-2025. Palliative care consulted. Started on IV dilaudid . IV abx stopped. Code status changed to DNR/Comfort care.  Paracentesis Labs: Lab Results  Component Value Date/Time   FLUIDTYPE TIMED 06/14/2024 10:56 AM   COLORFL YELLOW 06/14/2024 10:56 AM   APPEARANCEFL HAZY (A) 06/14/2024 10:56 AM   WBCFLUID 454 06/14/2024 10:56 AM   FNEUT 81 (H) 06/14/2024 10:56 AM   LYMPHSFL 9 06/14/2024 10:56 AM   MONOMACSERFL 10 (L) 06/14/2024 10:56 AM   EOSFL 0 06/14/2024 10:56 AM   OTHERCELLSBF OTHER CELLS IDENTIFIED AS MESOTHELIAL CELLS 06/14/2024 10:56 AM   ALBFL RESULTS UNAVAILABLE DUE TO INTERFERING SUBSTANCE 06/14/2024 10:56 AM   SDES PERITONEAL 06/14/2024 10:56 AM   SPECREQUEST ABDOMEN 06/14/2024 10:56 AM   GRAMSTAIN NO WBC SEEN NO ORGANISMS SEEN  06/14/2024 10:56 AM   CULT  06/14/2024 10:56 AM    NO GROWTH 3 DAYS Performed at [facility], [address] [place], [place] [address]    REPTSTATUS 06/17/2024 FINAL 06/14/2024 10:56 AM     DNR (do not resuscitate) Prior to 06/17/24 Pt decided she wanted to switch to comfort care measures  on 06-15-2024. Palliative care consulted. Started on IV dilaudid . IV abx stopped. Code status changed to DNR/Comfort care.  Sepsis due to pneumonia [facility]) June 13, 2024 present on admission.  Fits criteria for sepsis due to elevated white count of 25.1.  Her initial blood pressure was low at 86/47 but did respond to fluids.  She has acute kidney injury with an elevated creatinine of 2.16.  She has sepsis with acute organ dysfunction due to pneumonia.    Ascites June 13, 2024 CT scan shows ascites.  This is confirmed on bedside ultrasound.  Patient complains of early satiety due to feeling full.  She has probably less than 2 L in her abdomen but family is interested in  getting this fluid removed.  Patient is agreeable to paracentesis.  Discussed with the family that I will send off the ascites fluid for cytology.  If this shows that she has cancerous cells in her ascitic fluid, this would portend a very poor prognosis.  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  They understand this.   Prior to 06/17/24 paracentesis performed on 06-14-2024. It showed spontaneous bacterial peritonitis. Pt continued with IV abx until pt decided she wanted to pursue comfort care. IV abx stopped on 9-22-205.  Acute metabolic acidosis June 13, 2024 due to her acute kidney injury and sepsis.  Continue with IV fluids.  She is received 2 L of IV fluids in ER.  Start p.o. bicarbonate 650 mg twice daily.  Repeat chemistry in the morning.   Acute kidney injury superimposed on stage 3a chronic kidney disease (HCC) June 13, 2024 due to poor p.o. intake and continued therapy at home with Lasix , ARB, Jardiance .  Discussed with both daughters that when patient's p.o. intake decreases due to illness, vomiting or diarrhea, that she should hold her Lasix , telmisartan  and Jardiance .  Continue with IV fluids.  Repeat BMP in the morning.   Prior to 06/17/24 due to bouts of hypotension, pt given multiple boluses of IVF and IV albumin . PCCM consulted but pt did not meet criteria to move to ICU.  Stage 4 pressure ulcer (HCC) June 13, 2024 Family wants wound care consult to continue packing of her chronic sacral decubitus wound.  Prior to 06/17/24 pt seen by wound care specialist. Pt with chronic sacral wound that was present prior to admission.  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  Patient's had chronic osteo least as far back as June 2025 on CT scan.  Family wants wound care consult to continue packing of her chronic sacral decubitus wound.   Prior to 06/17/24 pt seen by wound care specialist. Pt with chronic sacral  wound that was present prior to admission.  Paroxysmal atrial fibrillation (HCC) June 13, 2024 holding Lopressor  due to sepsis, hypotension on admission.  Will need to restart this if her rate starts to become uncontrolled.  Currently off of systemic anticoagulation due to recent GI bleeding.   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 Lanny.  Patient is currently on maintenance therapy but has not received this in a while due to repeated hospital admissions.  She remains a full code.   Prior to 06/17/24 pt decided she wanted to pursue comfort care on 06-15-2024. Pt seen by oncology who agreed with pt's decision.  Type 2 diabetes mellitus with stage 3a chronic kidney disease, without long-term current use of insulin  (HCC) June 13, 2024 holding Jardiance .  Place  her on sliding scale insulin .  Appetite is quite poor according to family.  Place her on sensitive scale.  Essential hypertension June 13, 2024 Blood pressure currently 102/44.  Patient and family both state that this is a normal blood pressure for her.  Will hold her Lopressor , telmisartan  and Lasix  for now given her acute kidney injury.  Would restart her Lopressor  first if blood pressure allows.   DVT prophylaxis:   None because of comfort care   Code Status: Do not attempt resuscitation (DNR) - Comfort care Family Communication: discussed with pt's dtr tiffany and clellie at bedside Disposition Plan: expecting in-hospital death Reason for continuing need for hospitalization: remains on IV dilaudid  infusion.  Objective: Vitals:   06/15/24 0030 06/15/24 0330 06/15/24 0630 06/15/24 0900  BP: 102/81 (!) 91/45 (!) 98/42 (!) 106/52  Pulse: (!) 126 99 100 94  Resp: 12 12 16 10   Temp: (!) 95 F (35 C) (!) 95 F (35 C) (!) 97.3 F (36.3 C) (!) 97.5 F (36.4 C)  TempSrc: Rectal Rectal Oral Oral  SpO2: 93% 100% 99% 98%  Weight:      Height:       No intake or output data in  the 24 hours ending 06/17/24 1547 Filed Weights   06/13/24 1808  Weight: 63.6 kg    Examination:  Physical Exam Vitals and nursing note reviewed.  Constitutional:      Comments: Sleeping peacefully. Periods of apnea 10-20 secs  Pulmonary:     Effort: Pulmonary effort is normal. No respiratory distress.  Neurological:     Comments: Comfortable. Eyes covered with washcloth     Data Reviewed: I have personally reviewed following labs and imaging studies  CBC: Recent Labs  Lab 06/13/24 1845 06/14/24 0609 06/15/24 0500  WBC 25.1* 25.0* 25.9*  NEUTROABS 23.4* 23.1*  --   HGB 11.1* 10.2* 7.7*  HCT 34.1* 31.3* 23.4*  MCV 87.7 87.9 87.3  PLT 287 264 197   Basic Metabolic Panel: Recent Labs  Lab 06/13/24 1845 06/14/24 0609 06/15/24 0500  NA 134* 134* 135  K 5.4* 4.7 4.1  CL 105 108 107  CO2 17* 17* 16*  GLUCOSE 85 87 88  BUN 48* 47* 45*  CREATININE 2.16* 2.02* 2.02*  CALCIUM  8.3* 8.1* 9.0  MG  --   --  2.1  PHOS  --   --  3.0   GFR: Estimated Creatinine Clearance: 19.7 mL/min (A) (by C-G formula based on SCr of 2.02 mg/dL (H)). Liver Function Tests: Recent Labs  Lab 06/13/24 1845 06/14/24 0609 06/15/24 0500  AST 111* 98* 70*  ALT 42 39 26  ALKPHOS 388* 346* 274*  BILITOT 0.8 0.7 1.7*  PROT 3.8* 3.2* 4.6*  ALBUMIN  <1.5* <1.5* 3.6   Recent Labs  Lab 06/13/24 1845  LIPASE 16   Coagulation Profile: Recent Labs  Lab 06/13/24 1845  INR 1.3*   Cardiac Enzymes: Recent Labs  Lab 06/14/24 0609 06/15/24 0500  CKTOTAL 129 33*   CBG: Recent Labs  Lab 06/14/24 1602 06/14/24 2102 06/15/24 0009 06/15/24 0404 06/15/24 0750  GLUCAP 79 100* 103* 87 115*   Sepsis Labs: Recent Labs  Lab 06/13/24 2252 06/14/24 0609 06/14/24 2025 06/15/24 0000 06/15/24 0500  PROCALCITON  --  6.21  --   --  5.28  LATICACIDVEN 2.0*  --  1.2 1.2  --     Recent Results (from the past 240 hours)  Resp panel by RT-PCR (RSV, Flu A&B, Covid)  Status: None    Collection Time: 06/13/24  6:18 PM   Specimen: Nasal Swab  Result Value Ref Range Status   SARS Coronavirus 2 by RT PCR NEGATIVE NEGATIVE Final   Influenza A by PCR NEGATIVE NEGATIVE Final   Influenza B by PCR NEGATIVE NEGATIVE Final    Comment: (NOTE) The Xpert Xpress SARS-CoV-2/FLU/RSV plus assay is intended as an aid in the diagnosis of influenza from Nasopharyngeal swab specimens and should not be used as a sole basis for treatment. Nasal washings and aspirates are unacceptable for Xpert Xpress SARS-CoV-2/FLU/RSV testing.  Fact Sheet for Patients: BloggerCourse.com  Fact Sheet for Healthcare Providers: SeriousBroker.it  This test is not yet approved or cleared by the [place]  FDA and has been authorized for detection and/or diagnosis of SARS-CoV-2 by FDA under an Emergency Use Authorization (EUA). This EUA will remain in effect (meaning this test can be used) for the duration of the COVID-19 declaration under Section 564(b)(1) of the Act, 21 U.S.C. section 360bbb-3(b)(1), unless the authorization is terminated or revoked.     Resp Syncytial Virus by PCR NEGATIVE NEGATIVE Final    Comment: (NOTE) Fact Sheet for Patients: BloggerCourse.com  Fact Sheet for Healthcare Providers: SeriousBroker.it  This test is not yet approved or cleared by the [place]  FDA and has been authorized for detection and/or diagnosis of SARS-CoV-2 by FDA under an Emergency Use Authorization (EUA). This EUA will remain in effect (meaning this test can be used) for the duration of the COVID-19 declaration under Section 564(b)(1) of the Act, 21 U.S.C. section 360bbb-3(b)(1), unless the authorization is terminated or revoked.  Performed at [facility], [address], [place], [place] [address]   Culture, blood (routine x 2)     Status: None (Preliminary result)   Collection Time:  06/13/24  6:45 PM   Specimen: BLOOD  Result Value Ref Range Status   Specimen Description BLOOD SITE NOT SPECIFIED  Final   Special Requests   Final    BOTTLES DRAWN AEROBIC AND ANAEROBIC Blood Culture results may not be optimal due to an inadequate volume of blood received in culture bottles   Culture   Final    NO GROWTH 4 DAYS Performed at [facility], [address], [place], [place] [address]    Report Status PENDING  Incomplete  Culture, blood (routine x 2)     Status: None (Preliminary result)   Collection Time: 06/13/24  6:45 PM   Specimen: BLOOD  Result Value Ref Range Status   Specimen Description BLOOD SITE NOT SPECIFIED  Final   Special Requests   Final    BOTTLES DRAWN AEROBIC AND ANAEROBIC Blood Culture results may not be optimal due to an inadequate volume of blood received in culture bottles   Culture   Final    NO GROWTH 4 DAYS Performed at [facility], [address], [place], [place] [address]    Report Status PENDING  Incomplete  Body fluid culture w Gram Stain     Status: None   Collection Time: 06/14/24 10:56 AM   Specimen: Abdomen; Peritoneal Fluid  Result Value Ref Range Status   Specimen Description PERITONEAL  Final   Special Requests ABDOMEN  Final   Gram Stain NO WBC SEEN NO ORGANISMS SEEN   Final   Culture   Final    NO GROWTH 3 DAYS Performed at [facility], [address], [place], [place] [address]    Report Status  06/17/2024 FINAL  Final    Scheduled Meds:  glycopyrrolate   1 mg Oral Q4H   Or   glycopyrrolate   0.2 mg Subcutaneous Q4H   Or   glycopyrrolate   0.2 mg Intravenous Q4H   Continuous Infusions:  HYDROmorphone  (DILAUDID ) 50 mg in sodium chloride  0.9 % 50 mL (1 mg/mL) infusion 1 mg/hr (06/17/24 0440)     LOS: 4 days   Time spent: 60 minutes  Camellia Door, DO  [facility]  06/17/2024, 3:47 PM

## 2024-06-17 NOTE — TOC Progression Note (Signed)
 Transition of Care [facility]) - Progression Note    Patient Details  Name: [name] MRN: [id number] Date of Birth: 1941/05/07  Transition of [facility]) CM/SW Contact  [name], LCSWA Phone Number: 06/17/2024, 1:49 PM  Clinical Narrative:     1:49 PM Per progressions, remains comfort care and anticipated to have an in hospital death. Per chart review, patient's prognosis is hours to days. TOC will continue to follow.  Expected Discharge Plan: Hospice Medical Facility Barriers to Discharge: Other (must enter comment), Continued Medical Work up (GOC)               Expected Discharge Plan and Services   Discharge Planning Services: CM Consult   Living arrangements for the past 2 months: Single Family Home, Skilled Nursing Facility                                       Social Drivers of Health (SDOH) Interventions SDOH Screenings   Food Insecurity: No Food Insecurity (06/14/2024)  Housing: Low Risk  (06/14/2024)  Transportation Needs: No Transportation Needs (06/14/2024)  Utilities: Not At Risk (06/14/2024)  Depression (PHQ2-9): Low Risk  (05/18/2024)  Social Connections: Moderately Isolated (06/14/2024)  Tobacco Use: Medium Risk (06/13/2024)    Readmission Risk Interventions    06/04/2024    3:35 PM 04/16/2024    3:13 PM  Readmission Risk Prevention Plan  Transportation Screening Complete Complete  PCP or Specialist Appt within 3-5 Days  Complete  HRI or Home Care Consult  Complete  Social Work Consult for Recovery Care Planning/Counseling  Complete  Medication Review Oceanographer) Complete Referral to Pharmacy  PCP or Specialist appointment within 3-5 days of discharge Complete   HRI or Home Care Consult Complete   SW Recovery Care/Counseling Consult Complete   Palliative Care Screening Not Applicable   Skilled Nursing Facility Complete

## 2024-06-17 NOTE — Assessment & Plan Note (Addendum)
 Prior to 06/17/24 pt had paracentesis performed on 06-14-2024 that met criteria for spontaneous bacterial peritonitis. Pt continue with IV ABX. Pt decided she wanted to switch to comfort care measures on 06-15-2024. Palliative care consulted. Started on IV dilaudid . IV abx stopped. Code status changed to DNR/Comfort care.  Paracentesis Labs: Lab Results  Component Value Date/Time   FLUIDTYPE TIMED 06/14/2024 10:56 AM   COLORFL YELLOW 06/14/2024 10:56 AM   APPEARANCEFL HAZY (A) 06/14/2024 10:56 AM   WBCFLUID 454 06/14/2024 10:56 AM   FNEUT 81 (H) 06/14/2024 10:56 AM   LYMPHSFL 9 06/14/2024 10:56 AM   MONOMACSERFL 10 (L) 06/14/2024 10:56 AM   EOSFL 0 06/14/2024 10:56 AM   OTHERCELLSBF OTHER CELLS IDENTIFIED AS MESOTHELIAL CELLS 06/14/2024 10:56 AM   ALBFL RESULTS UNAVAILABLE DUE TO INTERFERING SUBSTANCE 06/14/2024 10:56 AM   SDES PERITONEAL 06/14/2024 10:56 AM   SPECREQUEST ABDOMEN 06/14/2024 10:56 AM   GRAMSTAIN NO WBC SEEN NO ORGANISMS SEEN  06/14/2024 10:56 AM   CULT  06/14/2024 10:56 AM    NO GROWTH 3 DAYS Performed at [facility], [address] [place], [place] [address]    REPTSTATUS 06/17/2024 FINAL 06/14/2024 10:56 AM

## 2024-06-17 NOTE — Subjective & Objective (Signed)
 Pt seen and examined. Met with pt's children at bedside. Pt's son [name] was sleeping in recliner. Spoke with both dtr [name] and [name] at bedside. Pt is peacefully on IV dilaudid  gtts. Periods of apnea 10-15 secs at a time. Dtrs do not think pt is in any pain at all. They understand pt will die in the hospital. Dtr are thankful that pt made the decision to pursue comfort care by herself and did not make her dtrs make that decision.

## 2024-06-18 ENCOUNTER — Encounter (HOSPITAL_BASED_OUTPATIENT_CLINIC_OR_DEPARTMENT_OTHER): Admitting: Internal Medicine

## 2024-06-18 LAB — CULTURE, BLOOD (ROUTINE X 2)
Culture: NO GROWTH
Culture: NO GROWTH

## 2024-06-24 NOTE — Progress Notes (Signed)
    Patient Name: [name]           DOB: [birth year]  MRN: [id number]       OVERNIGHT EVENT    Notified by RN that patient has expired at 0220.  2 RN verified. Patient was comfort care.   Family is at bedside.    Ustin Cruickshank, DNP, ACNPC- AG [facility] [place]

## 2024-06-24 NOTE — Death Summary Note (Signed)
 DEATH SUMMARY   Patient Details  Name: [name] MRN: [id number] DOB: 1941/07/10 ERE:Upfazmojxz, Lamarr RAMAN, MD Admission/Discharge Information   Admit Date:  14-Jun-2024  Date of Death: Date of Death: June 19, 2024  Time of Death: Time of Death: 0220  Length of Stay: 5   Principle Cause of death: aspiration pneumonia  Hospital Diagnoses: Principal Problem:   Aspiration pneumonia (HCC) Active Problems:   Sepsis due to pneumonia (HCC)   DNR (do not resuscitate)   Spontaneous bacterial peritonitis (HCC)   Terminal care   Acute kidney injury superimposed on stage 3a chronic kidney disease (HCC)   Acute metabolic acidosis   Ascites   Essential hypertension   Type 2 diabetes mellitus with stage 3a chronic kidney disease, without long-term current use of insulin  (HCC)   Gallbladder cancer (HCC)   Paroxysmal atrial fibrillation (HCC)   Sacral osteomyelitis (HCC)   Stage 4 pressure ulcer (HCC)   Palliative care by specialist   Hospital Course: CC: abd pain HPI: 83 year old white female with 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  She was discharged from the hospital on 06/09/2024 after spending about a week in the hospital due to an upper GI bleed.  She had a upper endoscopy on 06/04/2024 that showed clotted blood in the gastric body.  There was diffuse erythematous mucosa found within the cardia, gastric fundus, greater body and essentially entire stomach.  She was discharged to home on Protonix  and Carafate .  Patient states that the Carafate  is making her nauseous.  She had several bouts of nausea and vomiting on 06/12/2027.  Patient is pretty confident she aspirated when she was vomiting.  She has had some diarrhea  today but none in the ER.  Patient was initially going to be placed back at [facility] nursing home when she was discharged but due to no available nursing home days left on her insurance, the family decided to take her home.  She is currently residing with her daughter Annabella.  EMS was called out to the house today due to the patient's abdominal pain.  Blood pressure recorded by EMS was 122/74 heart rate 52 pulse oximetry 94% on room air.  Arrival to the ER, temp 97.6 heart rate 95 initial blood pressure was 86/47.  She was given 2 L of IV fluids with good response in her blood pressure.  White count 25.1, hemoglobin 11.1, platelets of 287  Sodium 134, potassium 5.4, bicarb 17, BUN of 48, creatinine 2.16, calcium  8.3  Total protein 3.8, albumin  less than 1.5, AST of 111, ALT 42, alk phos 388, total bili 0.8  COVID-negative, RSV negative, influenza negative  Lipase of 16  Portable chest x-ray showed right lower lobe consolidation  CT chest abdomen pelvis demonstrated right lower lobe and right middle lobe consolidation.  Small bilateral pleural effusions.  Large volume ascites.  Diffuse body wall edema.  Questionable wall thickening of the ascending and transverse colon.  There is a right sacral decubitus ulcer with cortical irregularity.    Patient given cefepime  and vancomycin .  [facility] hospitalist consulted for admission.  Significant Events: Admitted 14-Jun-2024 for aspiration pneumonia. 06-14-2024 hypotensive while getting paracentesis. Paracentesis cell count suggestive of spontaneous bacterial peritonitis. PCCM consulted due to persistent hypotension despite IVF and IV colloid resuscitation 06-15-2024  palliative care consulted. Pt may DNR/Comfort care. Oncology agrees with comfort care plan. Started on IV dilaudid   Admission Labs: White count 25.1, hemoglobin 11.1, platelets of 287 Sodium 134, potassium 5.4, bicarb 17, BUN of 48, creatinine 2.16, calcium  8.3 Total protein 3.8,  albumin  less than 1.5, AST of 111, ALT 42, alk phos 388, total bili 0.8 COVID-negative, RSV negative, influenza negative Lipase of 16  Admission Imaging Studies: Portable chest x-ray showed right lower lobe consolidation CT chest abdomen pelvis demonstrated right lower lobe and right middle lobe consolidation.  Small bilateral pleural effusions.  Large volume ascites.  Diffuse body wall edema.  Questionable wall thickening of the ascending and transverse colon.  There is a right sacral decubitus ulcer with cortical irregularity.    Significant Labs: Paracentesis cell count. WBC 454. 81% neutrophil. Absolute  neutrophil count 367 Paracentesis cytology negative for malignant cells  Significant Imaging Studies:   Antibiotic Therapy: Anti-infectives (From admission, onward)    Start     Dose/Rate Route Frequency Ordered Stop   06/13/24 1915  vancomycin  (VANCOCIN ) IVPB 1000 mg/200 mL premix        1,000 mg 200 mL/hr over 60 Minutes Intravenous  Once 06/13/24 1913 06/13/24 2105   06/13/24 1915  ceFEPIme  (MAXIPIME ) 2 g in sodium chloride  0.9 % 100 mL IVPB        2 g 200 mL/hr over 30 Minutes Intravenous  Once 06/13/24 1913 06/13/24 1955       Procedures: paracentesis  Consultants: PCCM Palliative care   Assessment and Plan: * Aspiration pneumonia (HCC) June 13, 2024 admit to MedSurg bed.  Given the patient's known and confirmed history of vomiting on June 11, 2024, we will continue IV antibiotic therapy with Unasyn .  Patient has drug allergy listed in her chart to Augmentin that causes hyperkalemia.  Have never seen that happen before.  May need to watch her serum potassium after transition to p.o. Augmentin.  If she does not become hyperkalemic, this drug allergy can be removed from her profile.  Fortunately she is not requiring any supplemental oxygen .  Prior to 06/17/24 pt treated with IV ABX. Pt decided she wanted to switch to comfort care measures on 06-15-2024.  Palliative care consulted. Started on IV dilaudid . IV abx stopped. Code status changed to DNR/Comfort care.  Pt died at 0220 on 07-05-24.   Terminal care Prior to 06/17/24 Pt decided she wanted to switch to comfort care measures on 06-15-2024. Palliative care consulted. Started on IV dilaudid . IV abx stopped. Code status changed to DNR/Comfort care.  Spontaneous bacterial peritonitis (HCC) Prior to 06/17/24 pt had paracentesis performed on 06-14-2024 that met criteria for spontaneous bacterial peritonitis. Pt continue with IV ABX. Pt decided she wanted to switch to comfort care measures on 06-15-2024. Palliative care consulted. Started on IV dilaudid . IV abx stopped. Code status changed to DNR/Comfort care.  Paracentesis Labs: Lab Results  Component Value Date/Time   FLUIDTYPE TIMED 06/14/2024 10:56 AM   COLORFL YELLOW 06/14/2024 10:56 AM   APPEARANCEFL HAZY (A) 06/14/2024 10:56 AM   WBCFLUID 454 06/14/2024 10:56 AM   FNEUT 81 (H) 06/14/2024 10:56 AM   LYMPHSFL 9 06/14/2024 10:56 AM   MONOMACSERFL 10 (L) 06/14/2024 10:56 AM   EOSFL 0 06/14/2024 10:56 AM   OTHERCELLSBF OTHER CELLS IDENTIFIED AS MESOTHELIAL CELLS 06/14/2024 10:56 AM   ALBFL RESULTS UNAVAILABLE DUE TO INTERFERING SUBSTANCE 06/14/2024 10:56 AM   SDES PERITONEAL 06/14/2024 10:56 AM   SPECREQUEST ABDOMEN 06/14/2024 10:56 AM   GRAMSTAIN  NO WBC SEEN NO ORGANISMS SEEN  06/14/2024 10:56 AM   CULT  06/14/2024 10:56 AM    NO GROWTH 3 DAYS Performed at [facility], [address], [place], [place] [address]    REPTSTATUS 06/17/2024 FINAL 06/14/2024 10:56 AM     DNR (do not resuscitate) Prior to 06/17/24 Pt decided she wanted to switch to comfort care measures on 06-15-2024. Palliative care consulted. Started on IV dilaudid . IV abx stopped. Code status changed to DNR/Comfort care.  Sepsis due to pneumonia [facility]) June 13, 2024 present on admission.  Fits criteria for sepsis due to elevated white count of 25.1.  Her  initial blood pressure was low at 86/47 but did respond to fluids.  She has acute kidney injury with an elevated creatinine of 2.16.  She has sepsis with acute organ dysfunction due to pneumonia.    Ascites June 13, 2024 CT scan shows ascites.  This is confirmed on bedside ultrasound.  Patient complains of early satiety due to feeling full.  She has probably less than 2 L in her abdomen but family is interested in getting this fluid removed.  Patient is agreeable to paracentesis.  Discussed with the family that I will send off the ascites fluid for cytology.  If this shows that she has cancerous cells in her ascitic fluid, this would portend a very poor prognosis.  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  They understand this.   Prior to 06/17/24 paracentesis performed on 06-14-2024. It showed spontaneous bacterial peritonitis. Pt continued with IV abx until pt decided she wanted to pursue comfort care. IV abx stopped on 9-22-205.  Acute metabolic acidosis June 13, 2024 due to her acute kidney injury and sepsis.  Continue with IV fluids.  She is received 2 L of IV fluids in ER.  Start p.o. bicarbonate 650 mg twice daily.  Repeat chemistry in the morning.   Acute kidney injury superimposed on stage 3a chronic kidney disease (HCC) June 13, 2024 due to poor p.o. intake and continued therapy at home with Lasix , ARB, Jardiance .  Discussed with both daughters that when patient's p.o. intake decreases due to illness, vomiting or diarrhea, that she should hold her Lasix , telmisartan  and Jardiance .  Continue with IV fluids.  Repeat BMP in the morning.   Prior to 06/17/24 due to bouts of hypotension, pt given multiple boluses of IVF and IV albumin . PCCM consulted but pt did not meet criteria to move to ICU.  Stage 4 pressure ulcer (HCC) June 13, 2024 Family wants wound care consult to continue packing of her chronic sacral decubitus  wound.  Prior to 06/17/24 pt seen by wound care specialist. Pt with chronic sacral wound that was present prior to admission.  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  Patient's had chronic osteo least as far back as June 2025 on CT scan.  Family wants wound care consult to continue packing of her chronic sacral decubitus wound.   Prior to 06/17/24 pt seen by wound care specialist. Pt with chronic sacral wound that was present prior to admission.  Paroxysmal atrial fibrillation (HCC) June 13, 2024 holding Lopressor  due to sepsis, hypotension on admission.  Will need to restart this if her rate starts to become uncontrolled.  Currently off of systemic anticoagulation due to recent GI bleeding.   Gallbladder cancer [facility]) June 13, 2024 followed by [facility] Dr. [facility].  Patient is currently on maintenance therapy but  has not received this in a while due to repeated hospital admissions.  She remains a full code.   Prior to 06/17/24 pt decided she wanted to pursue comfort care on 06-15-2024. Pt seen by oncology who agreed with pt's decision.  Type 2 diabetes mellitus with stage 3a chronic kidney disease, without long-term current use of insulin  (HCC) June 13, 2024 holding Jardiance .  Place her on sliding scale insulin .  Appetite is quite poor according to family.  Place her on sensitive scale.  Essential hypertension June 13, 2024 Blood pressure currently 102/44.  Patient and family both state that this is a normal blood pressure for her.  Will hold her Lopressor , telmisartan  and Lasix  for now given her acute kidney injury.  Would restart her Lopressor  first if blood pressure allows.      The results of significant diagnostics from this hospitalization (including imaging, microbiology, ancillary and laboratory) are listed below for reference.   Significant Diagnostic Studies: US  EKG SITE RITE Result Date: 06/15/2024 If Site Rite image not attached,  placement could not be confirmed due to current cardiac rhythm.  US  Paracentesis Result Date: 06/14/2024 INDICATION: Patient with a history of cholangiocarcinoma presents today with ascites. Interventional radiology asked to perform a diagnostic and therapeutic paracentesis. EXAM: ULTRASOUND GUIDED PARACENTESIS MEDICATIONS: 1% lidocaine  10 mL COMPLICATIONS: None immediate. PROCEDURE: Informed written consent was obtained from the patient after a discussion of the risks, benefits and alternatives to treatment. A timeout was performed prior to the initiation of the procedure. Initial ultrasound scanning demonstrates a large amount of ascites within the left lower abdominal quadrant. The left lower abdomen was prepped and draped in the usual sterile fashion. 1% lidocaine  was used for local anesthesia. Following this, a 19 gauge, 7-cm, Yueh catheter was introduced. An ultrasound image was saved for documentation purposes. The paracentesis was performed. The catheter was removed and a dressing was applied. The patient tolerated the procedure well without immediate post procedural complication. FINDINGS: Patient was hypotensive while in ultrasound. Only 100 mL of clear yellow fluid was removed for labs. IMPRESSION: Successful ultrasound-guided paracentesis yielding 100 mL of peritoneal fluid. Primary team made aware of patient's low blood pressure while in ultrasound. Procedure performed by Warren Dais, NP Electronically Signed   By: Cordella Banner   On: 06/14/2024 11:00   CT CHEST ABDOMEN PELVIS WO CONTRAST Result Date: 06/13/2024 CLINICAL DATA:  Sepsis.  Abdominal pain and hypotension. EXAM: CT CHEST, ABDOMEN AND PELVIS WITHOUT CONTRAST TECHNIQUE: Multidetector CT imaging of the chest, abdomen and pelvis was performed following the standard protocol without IV contrast. RADIATION DOSE REDUCTION: This exam was performed according to the departmental dose-optimization program which includes automated exposure  control, adjustment of the mA and/or kV according to patient size and/or use of iterative reconstruction technique. COMPARISON:  CT chest abdomen and pelvis 06/02/2024. FINDINGS: CT CHEST FINDINGS Cardiovascular: Heart is mildly enlarged. Aorta is normal in size. There are atherosclerotic calcifications of the aorta and arteries. Patient is status post cardiac surgery. There is no pericardial effusion. Left chest port catheter tip ends in the right atrium. Mediastinum/Nodes: No enlarged mediastinal, hilar, or axillary lymph nodes. Thyroid  gland, trachea, and esophagus demonstrate no significant findings. Lungs/Pleura: There small bilateral pleural effusions, right greater than left. There is airspace consolidation in the right lower lobe inferiorly with bronchograms. There is also small amount airspace consolidation right middle lobe. Atelectatic changes are seen left lower lobe/lung base. No pneumothorax visualized. Musculoskeletal: There is diffuse chest wall edema, particularly right. No  acute fractures are seen. Median sternotomy wires are present. CT ABDOMEN PELVIS FINDINGS Hepatobiliary: Biliary drainage catheter is unchanged in position. Small amount of pneumobilia persists. There is no focal liver lesion allowing for lack of intravenous contrast. Gallbladder is not visualized. Pancreas: Unremarkable. No pancreatic ductal dilatation or surrounding inflammatory changes. Spleen: Normal in size without focal abnormality. Adrenals/Urinary Tract: There is a 3 mm calculus in the right kidney. Extensive vascular calcifications are present. There is no hydronephrosis in either kidney. Adrenal glands and bladder are within normal limits. Stomach/Bowel: There is no bowel obstruction, pneumatosis or free air. There some questionable wall thickening of the ascending colon and transverse colon. The appendix is not visualized. Small bowel and stomach are within normal limits. Vascular/Lymphatic: There are extensive  vascular calcifications present. Aorta and IVC are normal in size. Iliac vascular stents are present. No enlarged lymph nodes are seen. Reproductive: Ovaries and adnexa are within normal limits. Other: There is large volume ascites. There is diffuse body wall edema. There is no focal abdominal wall hernia. Musculoskeletal: Single rounded lucent lesion in the L5 vertebral body appears unchanged. There are degenerative changes of both hips. Right sacral decubitus ulcer is present there is mild underlying cortical irregularity. IMPRESSION: 1. Right lower lobe and right middle lobe airspace consolidation worrisome for pneumonia. 2. Small bilateral pleural effusions, right greater than left. 3. Large volume ascites. 4. Diffuse body wall edema. 5. Questionable wall thickening of the ascending and transverse colon. Correlate clinically for colitis. 6. Nonobstructing right renal calculus. 7. Right sacral decubitus ulcer with mild underlying cortical irregularity. Correlate clinically for osteomyelitis. 8. Stable single lucent lesion in the L5 vertebral body. 9. Aortic atherosclerosis. Aortic Atherosclerosis (ICD10-I70.0). Electronically Signed   By: Greig Pique M.D.   On: 06/13/2024 21:23   DG Chest Portable 1 View Result Date: 06/13/2024 CLINICAL DATA:  Questionable pneumonia.  Abdominal pain. EXAM: PORTABLE CHEST 1 VIEW COMPARISON:  06/02/2024 FINDINGS: Left Port-A-Cath remains in place, unchanged. Prior CABG. Consolidation in the right lower lobe compatible with pneumonia. No focal opacity on the left. Heart and mediastinal contours are stable. IMPRESSION: Right lower lobe consolidation compatible with pneumonia. Electronically Signed   By: Franky Crease M.D.   On: 06/13/2024 19:08   CT CHEST ABDOMEN PELVIS WO CONTRAST Result Date: 06/02/2024 CLINICAL DATA:  known biliary cancer. worsening leukocytosis. Eval for abscess. Stent in CBD. * Tracking Code: BO * EXAM: CT CHEST, ABDOMEN AND PELVIS WITHOUT CONTRAST  TECHNIQUE: Multidetector CT imaging of the chest, abdomen and pelvis was performed following the standard protocol without IV contrast. RADIATION DOSE REDUCTION: This exam was performed according to the departmental dose-optimization program which includes automated exposure control, adjustment of the mA and/or kV according to patient size and/or use of iterative reconstruction technique. COMPARISON:  CT scan chest, abdomen and pelvis from 04/23/2024. FINDINGS: CT CHEST FINDINGS Cardiovascular: Normal cardiac size. No pericardial effusion. No aortic aneurysm. There are coronary artery calcifications, in keeping with coronary artery disease. There are also moderate to severe peripheral atherosclerotic vascular calcifications of thoracic aorta and its major branches. Mediastinum/Nodes: Visualized thyroid  gland appears grossly unremarkable. No solid / cystic mediastinal masses. There is fluid-filled and dilated esophagus, which is nonspecific but most likely seen in the settings of chronic gastroesophageal reflux disease versus esophageal dysmotility. No mediastinal or axillary lymphadenopathy by size criteria. Evaluation of bilateral hila is limited due to lack on intravenous contrast: however, no large hilar lymphadenopathy identified. Lungs/Pleura: The central tracheo-bronchial tree is patent. Since the prior  study, there are new airspace opacities in the left lung lower lobe, highly concerning for bronchopneumonia. Follow-up to clearing is recommended. No pleural effusion or pneumothorax. No lung mass or collapse. There are multiple scattered sub 4 mm noncalcified nodules in the right lung (marked with electronic arrow sign on series 100), which are unchanged since the prior study. No new suspicious lung nodule seen. Musculoskeletal: The visualized soft tissues of the chest wall are grossly unremarkable. No suspicious osseous lesions. There are mild to moderate multilevel degenerative changes in the visualized  spine. CT ABDOMEN PELVIS FINDINGS Hepatobiliary: The liver is normal in size. Non-cirrhotic configuration. Previously seen hypoattenuating lesion in the segment 4A is not well seen on today's exam due to lack of intravenous contrast. There is an irregular hypoattenuating lesion in the right hepatic lobe, segment 4B (series 2, image 54), which appears decreased in size since the prior study. No other discrete liver lesion seen within the limitations of this unenhanced exam. Contrast-enhanced MRI or CT scan exam is recommended for better evaluation of liver lesions. No intrahepatic bile duct dilation. Left hepatic lobe pneumobilia noted. There is stable positioning of the biliary stent. Gallbladder is surgically absent. Pancreas: Unremarkable. No pancreatic ductal dilatation or surrounding inflammatory changes. Spleen: Within normal limits. No focal lesion. Adrenals/Urinary Tract: Adrenal glands are unremarkable. No suspicious renal mass within the limitations of this unenhanced exam. No nephroureterolithiasis or obstructive uropathy. Bilateral extensive renal vascular calcifications noted. Unremarkable urinary bladder. Stomach/Bowel: No disproportionate dilation of the small or large bowel loops. No evidence of abnormal bowel wall thickening or inflammatory changes. The appendix was not visualized; however there is no acute inflammatory process in the right lower quadrant. Vascular/Lymphatic: There is mild ascites in the dependent pelvis. No walled-off abscess. No pneumoperitoneum. No abdominal or pelvic lymphadenopathy, by size criteria. No aneurysmal dilation of the major abdominal arteries. There are marked peripheral atherosclerotic vascular calcifications of the aorta and its major branches. Reproductive: The uterus is unremarkable. No large adnexal mass. Other: There is decubitus ulcer overlying the midline/right paramedian gluteal region. Underlying coccyx appears intact. No focal bone erosions. There is  moderate anasarca. Musculoskeletal: No suspicious osseous lesions. There are mild - moderate multilevel degenerative changes in the visualized spine. IMPRESSION: 1. There are new airspace opacities in the left lung lower lobe, compatible with pneumonia. Follow-up to clearing is recommended. 2. There is a decubitus ulcer overlying the midline/right paramedian gluteal region. Underlying coccyx appears intact. No focal bone erosions. 3. Mild ascites. No walled-off abscess. No walled-off abscess or pneumoperitoneum. 4. Multiple other nonacute observations, as described above. Aortic Atherosclerosis (ICD10-I70.0). Electronically Signed   By: Ree Molt M.D.   On: 06/02/2024 14:43   DG Chest Portable 1 View Result Date: 06/02/2024 CLINICAL DATA:  Infectious evaluation. EXAM: PORTABLE CHEST 1 VIEW COMPARISON:  05/12/2024. FINDINGS: The heart size and mediastinal contours are unchanged. Prior median sternotomy and CABG. Left chest Port-A-Cath is unchanged. No focal consolidation, pleural effusion, or pneumothorax. No acute osseous abnormality. IMPRESSION: No acute cardiopulmonary findings. Electronically Signed   By: Harrietta Sherry M.D.   On: 06/02/2024 11:42   VAS US  UPPER EXTREMITY VENOUS DUPLEX Result Date: 05/19/2024 UPPER VENOUS STUDY  Patient Name:  HADIYAH MARICLE  Date of Exam:   05/19/2024 Medical Rec #: [id number]      Accession #:    [id number] Date of Birth: [birth year]      Patient Gender: F Patient Age:   89 years Exam Location:  [address] Procedure:  VAS US  UPPER EXTREMITY VENOUS DUPLEX Referring Phys: ONITA MATTOCK --------------------------------------------------------------------------------  Indications: Edema Performing Technologist: Duwaine Hives RVS  Examination Guidelines: A complete evaluation includes B-mode imaging, spectral Doppler, color Doppler, and power Doppler as needed of all accessible portions of each vessel. Bilateral testing is considered an integral part of a complete  examination. Limited examinations for reoccurring indications may be performed as noted.  Right Findings: +----------+------------+---------+-----------+----------+-------+ RIGHT     CompressiblePhasicitySpontaneousPropertiesSummary +----------+------------+---------+-----------+----------+-------+ IJV           Full       Yes       Yes                      +----------+------------+---------+-----------+----------+-------+ Subclavian    Full       Yes       Yes                      +----------+------------+---------+-----------+----------+-------+ Axillary      Full       Yes       Yes                      +----------+------------+---------+-----------+----------+-------+ Brachial      Full       Yes       Yes                      +----------+------------+---------+-----------+----------+-------+ Radial        Full                                          +----------+------------+---------+-----------+----------+-------+ Ulnar         Full                                          +----------+------------+---------+-----------+----------+-------+ Cephalic      Full                                          +----------+------------+---------+-----------+----------+-------+ Basilic       Full                                          +----------+------------+---------+-----------+----------+-------+  Left Findings: +----------+------------+---------+-----------+----------+-------+ LEFT      CompressiblePhasicitySpontaneousPropertiesSummary +----------+------------+---------+-----------+----------+-------+ Subclavian    Full       Yes       Yes                      +----------+------------+---------+-----------+----------+-------+  Summary:  Right: No evidence of deep vein thrombosis in the upper extremity. No evidence of superficial vein thrombosis in the upper extremity.  Left: No evidence of thrombosis in the subclavian.  *See table(s) above  for measurements and observations.  Diagnosing physician: Lonni Gaskins MD Electronically signed by Lonni Gaskins MD on 05/19/2024 at 1:47:31 PM.    Final     Microbiology: Recent Results (from the past 240 hours)  Resp panel by RT-PCR (RSV, Flu A&B, Covid)     Status: None   Collection  Time: 06/13/24  6:18 PM   Specimen: Nasal Swab  Result Value Ref Range Status   SARS Coronavirus 2 by RT PCR NEGATIVE NEGATIVE Final   Influenza A by PCR NEGATIVE NEGATIVE Final   Influenza B by PCR NEGATIVE NEGATIVE Final    Comment: (NOTE) The Xpert Xpress SARS-CoV-2/FLU/RSV plus assay is intended as an aid in the diagnosis of influenza from Nasopharyngeal swab specimens and should not be used as a sole basis for treatment. Nasal washings and aspirates are unacceptable for Xpert Xpress SARS-CoV-2/FLU/RSV testing.  Fact Sheet for Patients: BloggerCourse.com  Fact Sheet for Healthcare Providers: SeriousBroker.it  This test is not yet approved or cleared by the [place]  FDA and has been authorized for detection and/or diagnosis of SARS-CoV-2 by FDA under an Emergency Use Authorization (EUA). This EUA will remain in effect (meaning this test can be used) for the duration of the COVID-19 declaration under Section 564(b)(1) of the Act, 21 U.S.C. section 360bbb-3(b)(1), unless the authorization is terminated or revoked.     Resp Syncytial Virus by PCR NEGATIVE NEGATIVE Final    Comment: (NOTE) Fact Sheet for Patients: BloggerCourse.com  Fact Sheet for Healthcare Providers: SeriousBroker.it  This test is not yet approved or cleared by the [place]  FDA and has been authorized for detection and/or diagnosis of SARS-CoV-2 by FDA under an Emergency Use Authorization (EUA). This EUA will remain in effect (meaning this test can be used) for the duration of the COVID-19 declaration under  Section 564(b)(1) of the Act, 21 U.S.C. section 360bbb-3(b)(1), unless the authorization is terminated or revoked.  Performed at [facility], [address] [place], [place] [address]   Culture, blood (routine x 2)     Status: None   Collection Time: 06/13/24  6:45 PM   Specimen: BLOOD  Result Value Ref Range Status   Specimen Description BLOOD SITE NOT SPECIFIED  Final   Special Requests   Final    BOTTLES DRAWN AEROBIC AND ANAEROBIC Blood Culture results may not be optimal due to an inadequate volume of blood received in culture bottles   Culture   Final    NO GROWTH 5 DAYS Performed at [facility], [address], [place], [place] [address]    Report Status 07/07/24 FINAL  Final  Culture, blood (routine x 2)     Status: None   Collection Time: 06/13/24  6:45 PM   Specimen: BLOOD  Result Value Ref Range Status   Specimen Description BLOOD SITE NOT SPECIFIED  Final   Special Requests   Final    BOTTLES DRAWN AEROBIC AND ANAEROBIC Blood Culture results may not be optimal due to an inadequate volume of blood received in culture bottles   Culture   Final    NO GROWTH 5 DAYS Performed at [facility], [address], [place], [place] [address]    Report Status 07-07-24 FINAL  Final  Body fluid culture w Gram Stain     Status: None   Collection Time: 06/14/24 10:56 AM   Specimen: Abdomen; Peritoneal Fluid  Result Value Ref Range Status   Specimen Description PERITONEAL  Final   Special Requests ABDOMEN  Final   Gram Stain NO WBC SEEN NO ORGANISMS SEEN   Final   Culture   Final    NO GROWTH 3 DAYS Performed at [facility], [address] [place], [place] [address]    Report Status 06/17/2024 FINAL  Final    Time  spent: 50 minutes  Signed: Camellia Door, DO 07-05-24

## 2024-06-24 NOTE — Progress Notes (Addendum)
 Dilaudid  IV waste at 30 ml. Witnessed by Hadassah, RN.

## 2024-06-24 NOTE — Progress Notes (Signed)
 Patient expired @0220 , second nurse verified. On call notified. Family at bedside. Post mortem checklist completed.

## 2024-06-24 DEATH — deceased
